# Patient Record
Sex: Male | Born: 1945 | Race: Black or African American | Hispanic: No | Marital: Single | Smoking: Never smoker
Health system: Southern US, Community
[De-identification: ages and names within clinical notes are randomized; demographics above are authoritative.]

## PROBLEM LIST (undated history)

## (undated) DIAGNOSIS — I63331 Cerebral infarction due to thrombosis of right posterior cerebral artery: Secondary | ICD-10-CM

## (undated) DIAGNOSIS — I639 Cerebral infarction, unspecified: Secondary | ICD-10-CM

## (undated) DIAGNOSIS — S72142A Displaced intertrochanteric fracture of left femur, initial encounter for closed fracture: Secondary | ICD-10-CM

## (undated) DIAGNOSIS — D62 Acute posthemorrhagic anemia: Secondary | ICD-10-CM

## (undated) DIAGNOSIS — I10 Essential (primary) hypertension: Secondary | ICD-10-CM

## (undated) DIAGNOSIS — E119 Type 2 diabetes mellitus without complications: Secondary | ICD-10-CM

## (undated) DIAGNOSIS — E1100 Type 2 diabetes mellitus with hyperosmolarity without nonketotic hyperglycemic-hyperosmolar coma (NKHHC): Secondary | ICD-10-CM

## (undated) DIAGNOSIS — E1110 Type 2 diabetes mellitus with ketoacidosis without coma: Secondary | ICD-10-CM

## (undated) DIAGNOSIS — K661 Hemoperitoneum: Secondary | ICD-10-CM

## (undated) DIAGNOSIS — E538 Deficiency of other specified B group vitamins: Secondary | ICD-10-CM

## (undated) HISTORY — DX: Cerebral infarction, unspecified: I63.9

## (undated) NOTE — *Deleted (*Deleted)
Hypoglycemic Event  CBG: 56  Treatment: 4 oz juice and crackers  Symptoms: None  Follow-up CBG: Time:*** CBG Result:***  Possible Reasons for Event: Did not have snack between meals  Comments/MD notified:Dr. Lytle Butte

---

## 1998-10-02 ENCOUNTER — Encounter: Payer: Self-pay | Admitting: Emergency Medicine

## 1998-10-02 ENCOUNTER — Inpatient Hospital Stay (HOSPITAL_COMMUNITY): Admission: EM | Admit: 1998-10-02 | Discharge: 1998-10-03 | Payer: Self-pay | Admitting: Emergency Medicine

## 1998-11-09 ENCOUNTER — Encounter: Admission: RE | Admit: 1998-11-09 | Discharge: 1998-11-09 | Payer: Self-pay | Admitting: Internal Medicine

## 2000-03-17 ENCOUNTER — Encounter: Payer: Self-pay | Admitting: *Deleted

## 2000-03-17 ENCOUNTER — Emergency Department (HOSPITAL_COMMUNITY): Admission: EM | Admit: 2000-03-17 | Discharge: 2000-03-17 | Payer: Self-pay | Admitting: Podiatry

## 2000-03-24 ENCOUNTER — Encounter: Admission: RE | Admit: 2000-03-24 | Discharge: 2000-04-12 | Payer: Self-pay | Admitting: Family Medicine

## 2002-09-07 ENCOUNTER — Emergency Department (HOSPITAL_COMMUNITY): Admission: EM | Admit: 2002-09-07 | Discharge: 2002-09-07 | Payer: Self-pay | Admitting: Emergency Medicine

## 2004-08-14 ENCOUNTER — Emergency Department (HOSPITAL_COMMUNITY): Admission: EM | Admit: 2004-08-14 | Discharge: 2004-08-14 | Payer: Self-pay | Admitting: Emergency Medicine

## 2005-06-02 ENCOUNTER — Ambulatory Visit: Payer: Self-pay | Admitting: Family Medicine

## 2005-06-09 ENCOUNTER — Ambulatory Visit: Payer: Self-pay | Admitting: Family Medicine

## 2005-07-08 ENCOUNTER — Ambulatory Visit: Payer: Self-pay | Admitting: Internal Medicine

## 2005-07-14 ENCOUNTER — Ambulatory Visit: Payer: Self-pay | Admitting: Internal Medicine

## 2005-08-22 ENCOUNTER — Ambulatory Visit: Payer: Self-pay | Admitting: Family Medicine

## 2005-08-29 ENCOUNTER — Ambulatory Visit: Payer: Self-pay | Admitting: Family Medicine

## 2006-07-02 ENCOUNTER — Emergency Department (HOSPITAL_COMMUNITY): Admission: EM | Admit: 2006-07-02 | Discharge: 2006-07-02 | Payer: Self-pay | Admitting: Emergency Medicine

## 2006-07-16 ENCOUNTER — Emergency Department (HOSPITAL_COMMUNITY): Admission: EM | Admit: 2006-07-16 | Discharge: 2006-07-16 | Payer: Self-pay | Admitting: Emergency Medicine

## 2008-01-27 ENCOUNTER — Emergency Department (HOSPITAL_COMMUNITY): Admission: EM | Admit: 2008-01-27 | Discharge: 2008-01-27 | Payer: Self-pay | Admitting: Emergency Medicine

## 2008-06-27 ENCOUNTER — Emergency Department (HOSPITAL_COMMUNITY): Admission: EM | Admit: 2008-06-27 | Discharge: 2008-06-27 | Payer: Self-pay | Admitting: Emergency Medicine

## 2008-11-02 ENCOUNTER — Emergency Department (HOSPITAL_COMMUNITY): Admission: EM | Admit: 2008-11-02 | Discharge: 2008-11-02 | Payer: Self-pay | Admitting: General Surgery

## 2009-04-04 IMAGING — CT CT HEAD W/O CM
1 series · 16 of 30 positions shown, 20 images · non-contrast
Comparison: None available

CLINICAL DATA: HIGH BLOOD SUGAR.  CONFUSION.

CT HEAD WITHOUT CONTRAST
TECHNIQUE: Contiguous axial images were obtained from the base of
the skull through the vertex without contrast

[Series 2: head_seq 4.5 h37s st · axial · 0.43mm/px · z∈[-144,+0]mm · 16 of 36 slices shown, 20 images]
[im 2/36  brain]
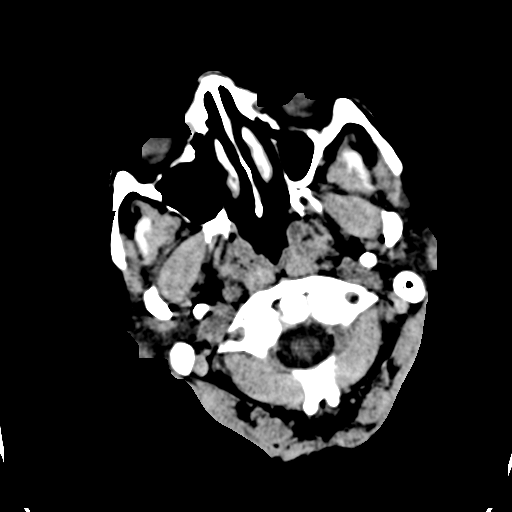
[im 2/36  bone]
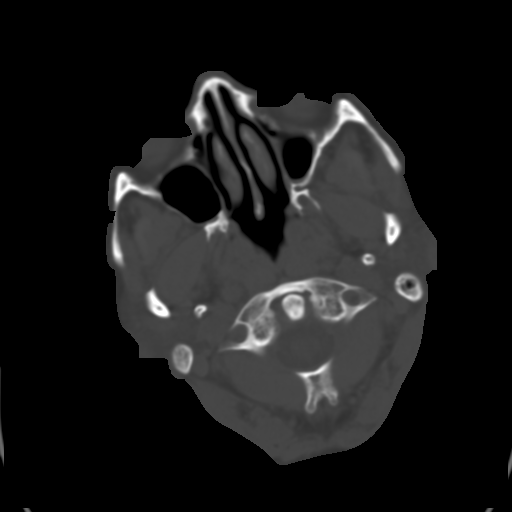
[im 4/36  brain]
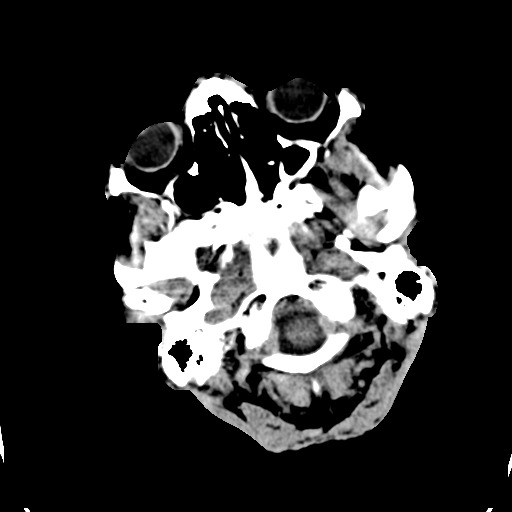
[im 7/36  brain]
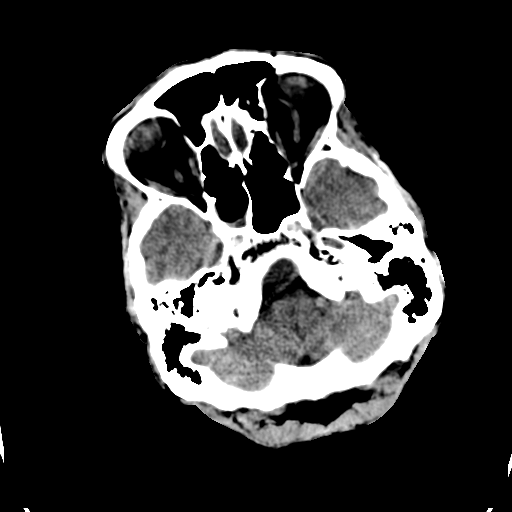
[im 9/36  brain]
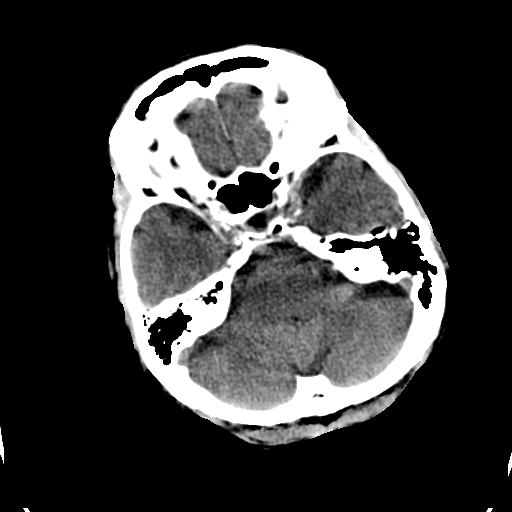
[im 10/36  brain]
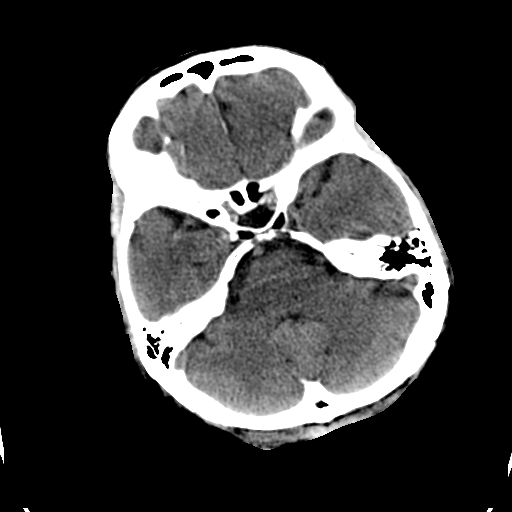
[im 10/36  bone]
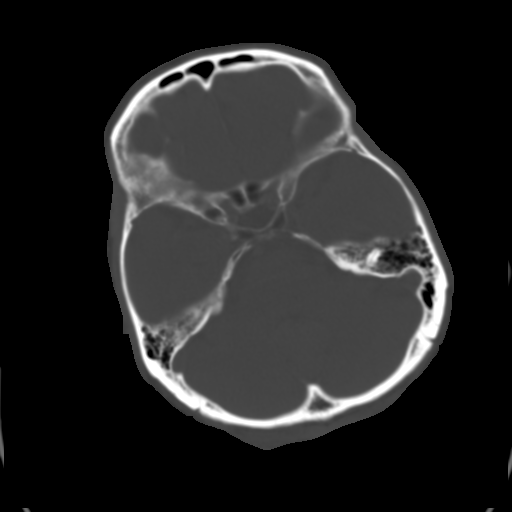
[im 13/36  brain]
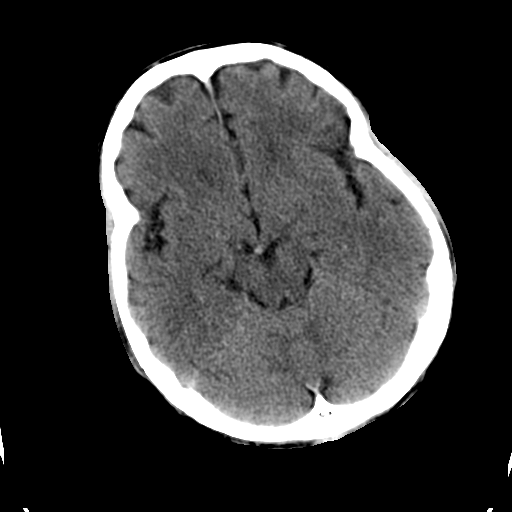
[im 15/36  brain]
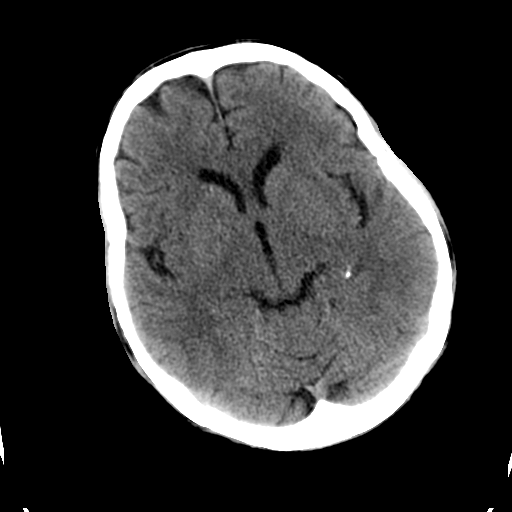
[im 17/36  brain]
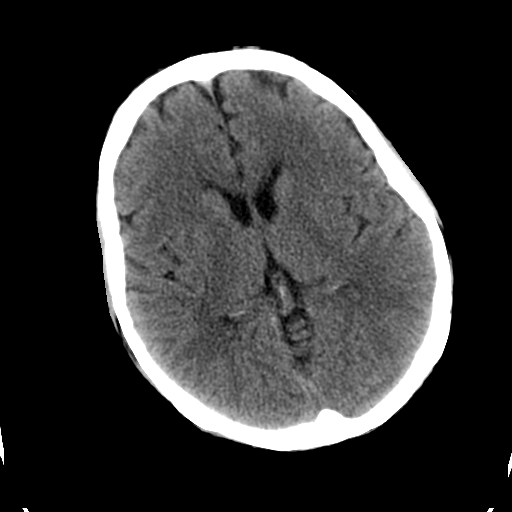
[im 19/36  brain]
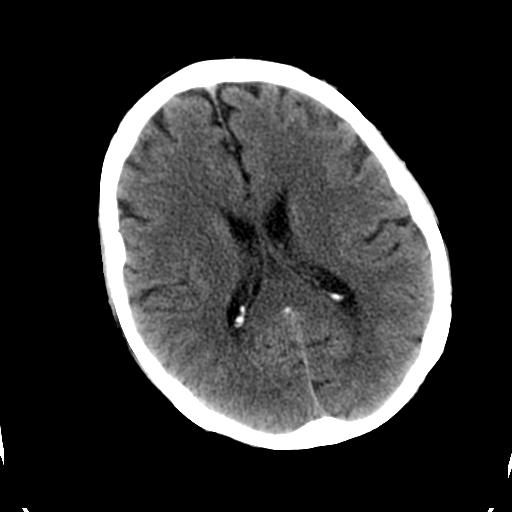
[im 19/36  bone]
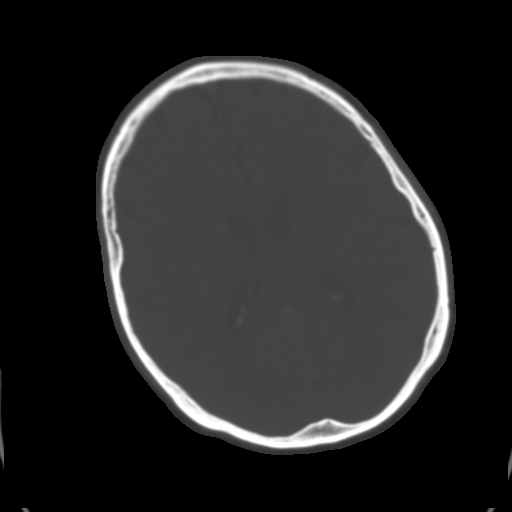
[im 21/36  brain]
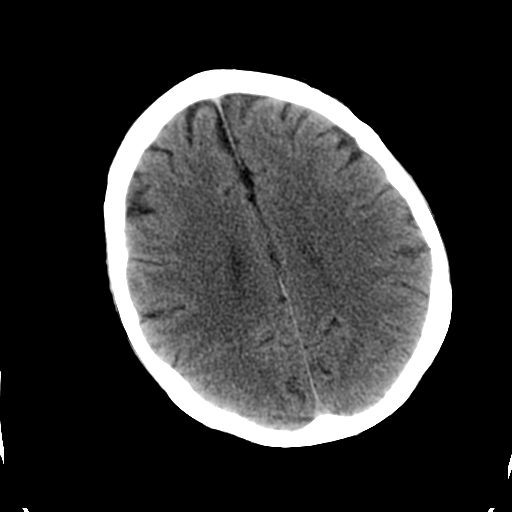
[im 23/36  brain]
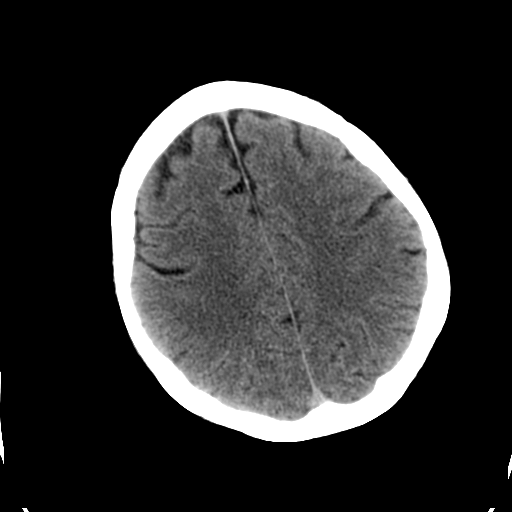
[im 26/36  brain]
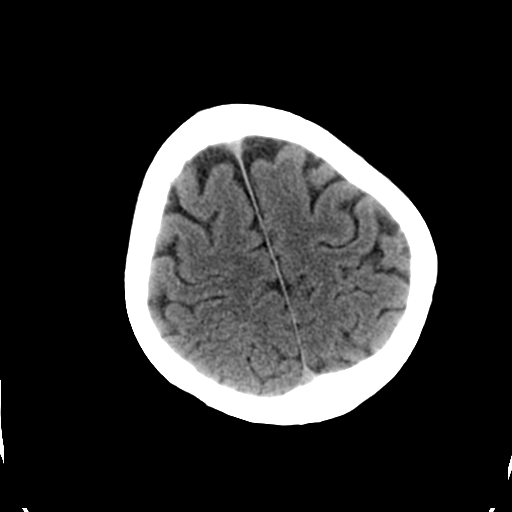
[im 27/36  brain]
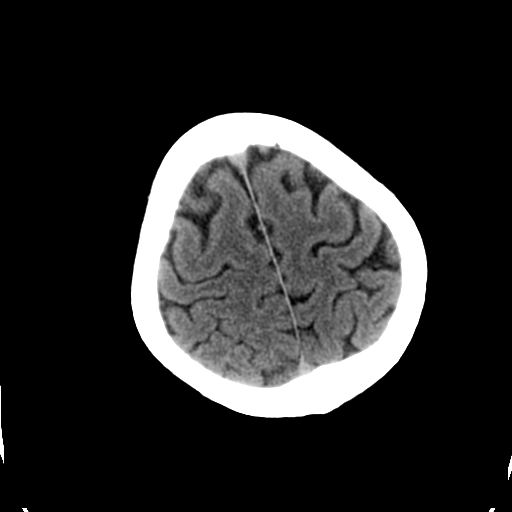
[im 27/36  bone]
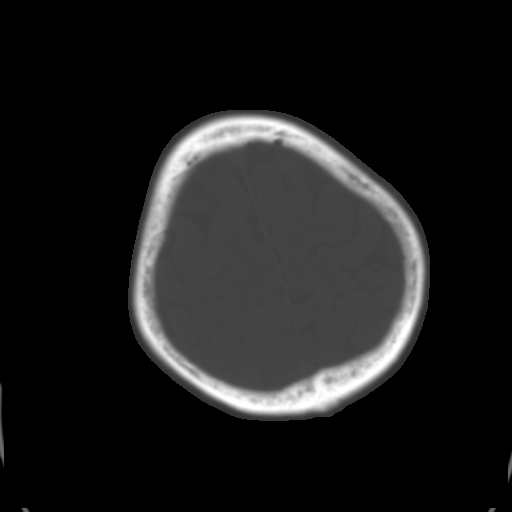
[im 29/36  brain]
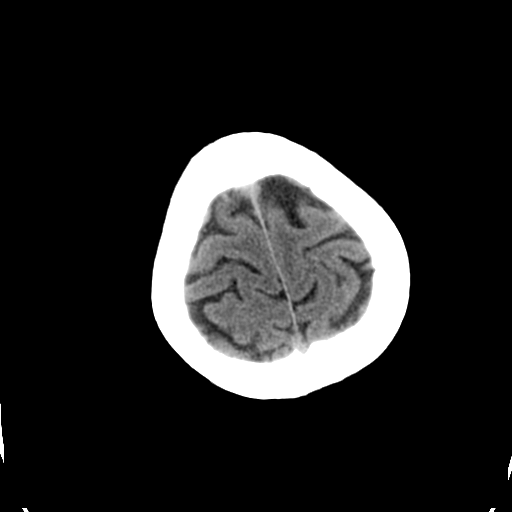
[im 32/36  brain]
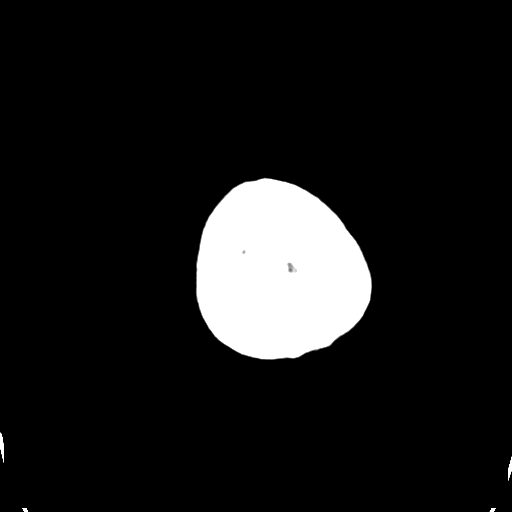
[im 34/36  brain]
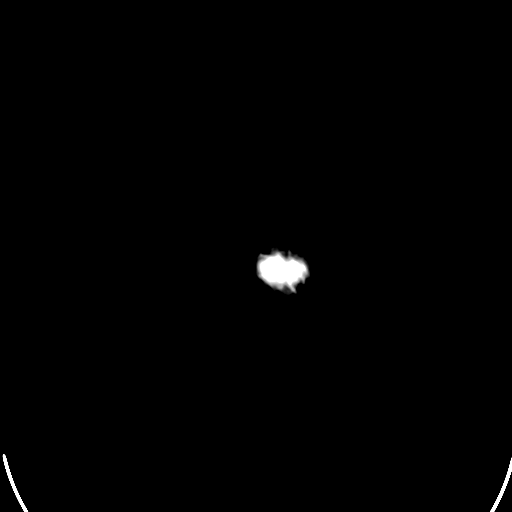

[16 of 30 positions shown; findings below may reference images not displayed]

FINDINGS: The brain has a normal appearance without evidence for
hemorrhage, acute infarction, hydrocephalus, or mass lesion.  There
is no extra axial fluid collection.  The calvarium is normal.  The
mucosal thickening is present within the right sphenoid sinus and
left maxillary sinus with frothy secretions.
IMPRESSION: Normal CT of the head without contrast.
Chronic paranasal sinus disease.

## 2010-01-09 IMAGING — CT CT HEAD W/O CM
1 series · 16 of 30 positions shown, 20 images · non-contrast
Comparison: 01/27/2008

CLINICAL DATA: Left arm numbness

CT HEAD WITHOUT CONTRAST
TECHNIQUE: Contiguous axial images were obtained from the base of
the skull through the vertex without contrast.

[Series 2: head_seq 4.5 h37s st · axial · 0.43mm/px · z∈[-121,+23]mm · 16 of 36 slices shown, 20 images]
[im 2/36  brain]
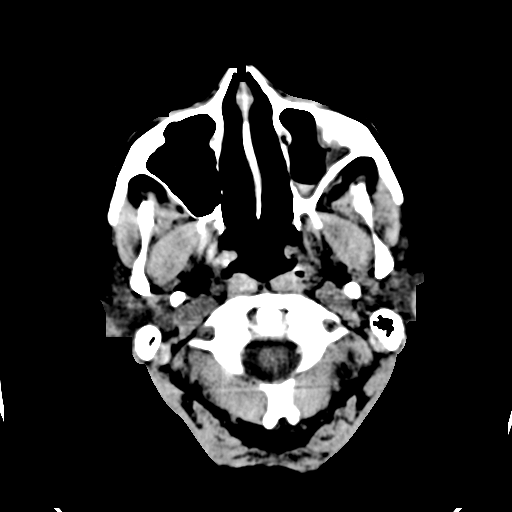
[im 2/36  bone]
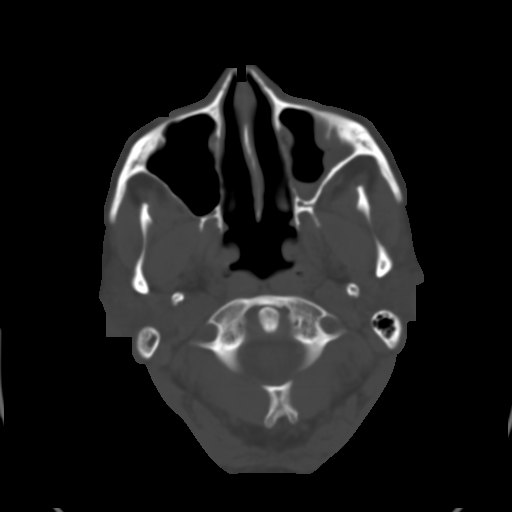
[im 4/36  brain]
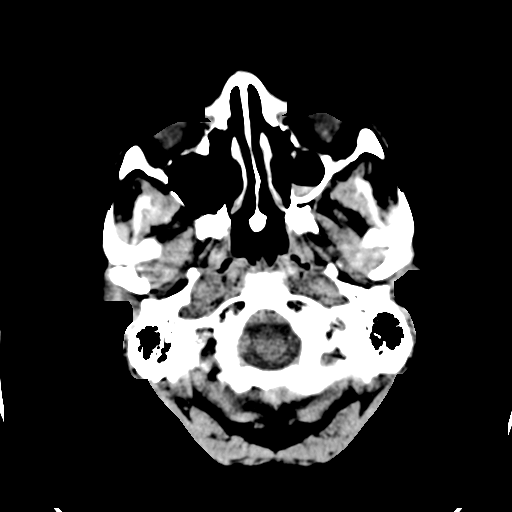
[im 7/36  brain]
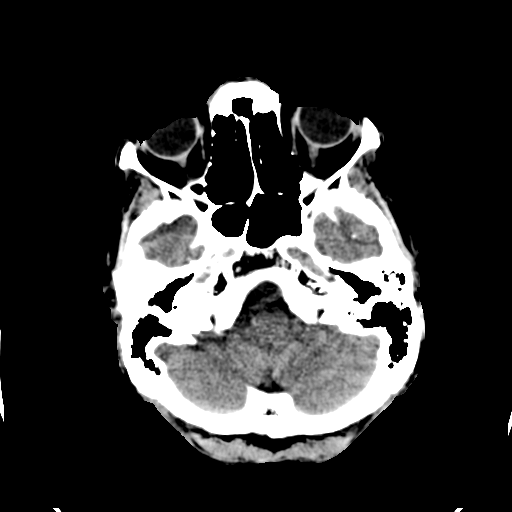
[im 9/36  brain]
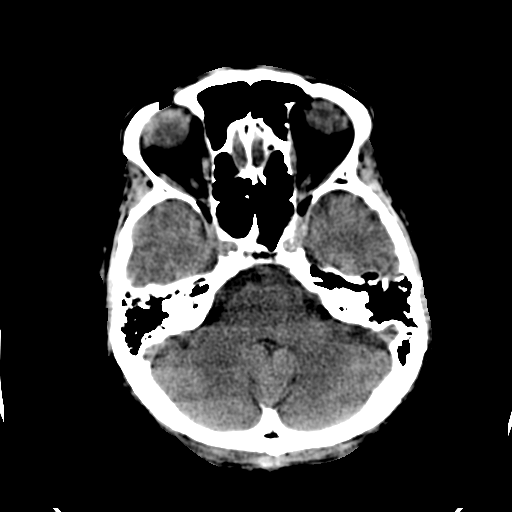
[im 10/36  brain]
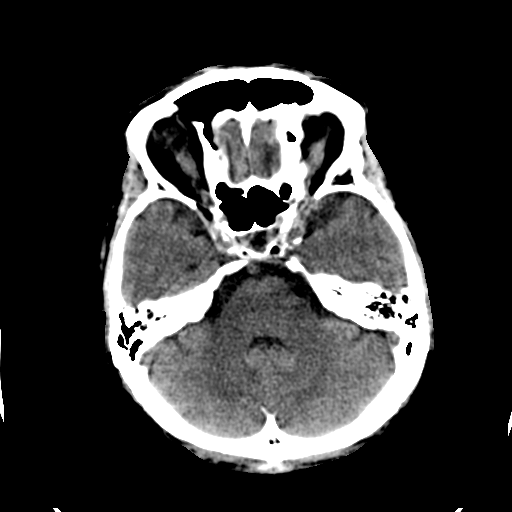
[im 10/36  bone]
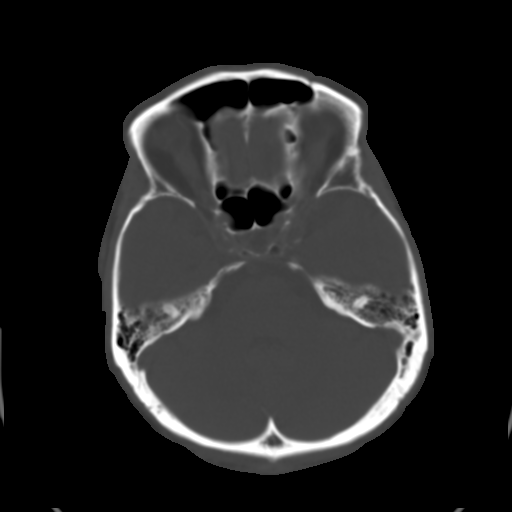
[im 13/36  brain]
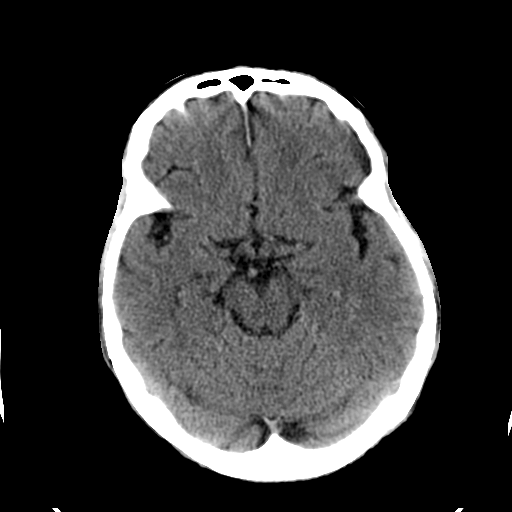
[im 15/36  brain]
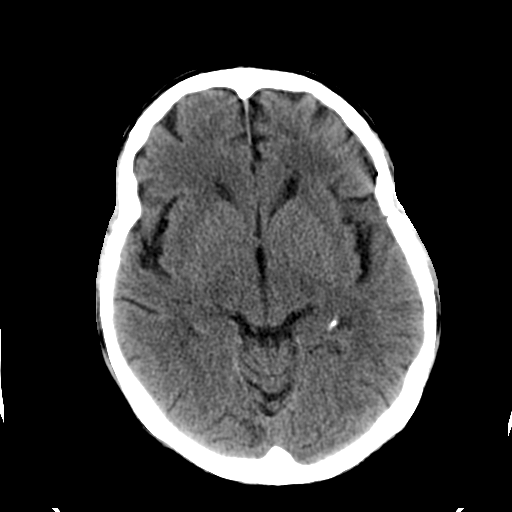
[im 17/36  brain]
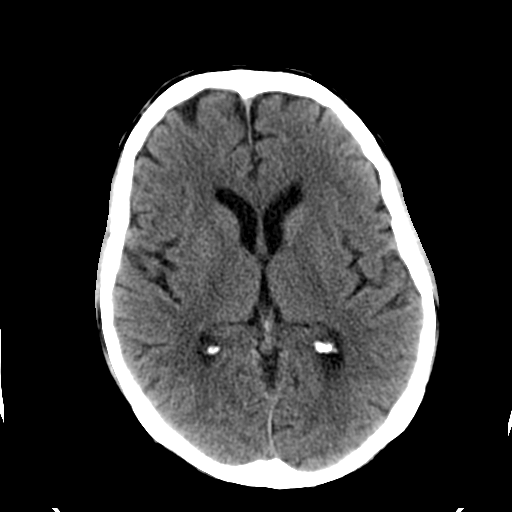
[im 19/36  brain]
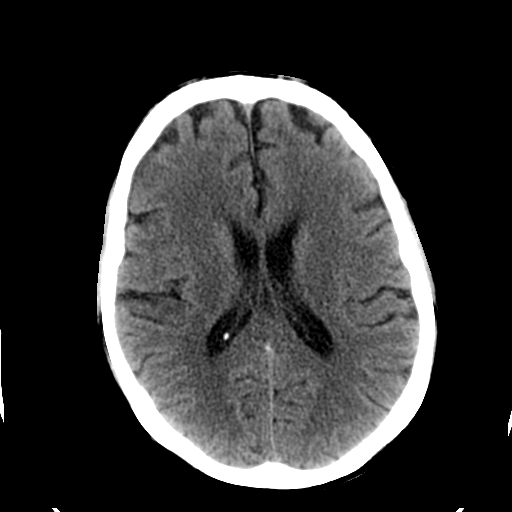
[im 19/36  bone]
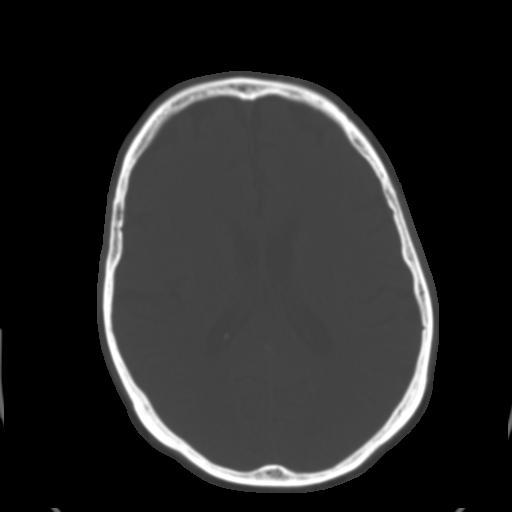
[im 21/36  brain]
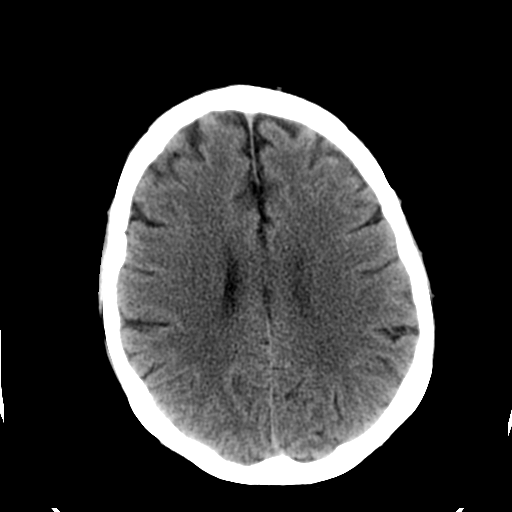
[im 23/36  brain]
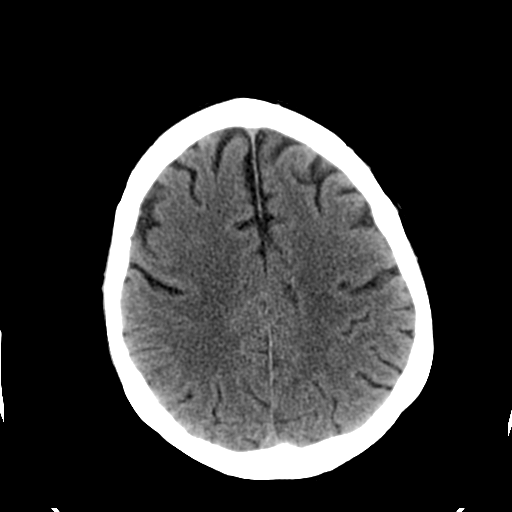
[im 26/36  brain]
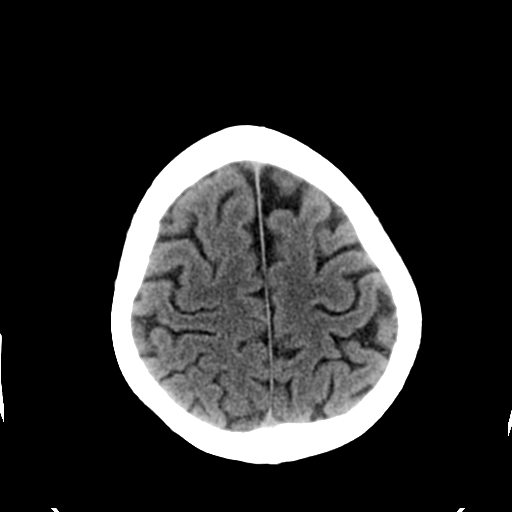
[im 27/36  brain]
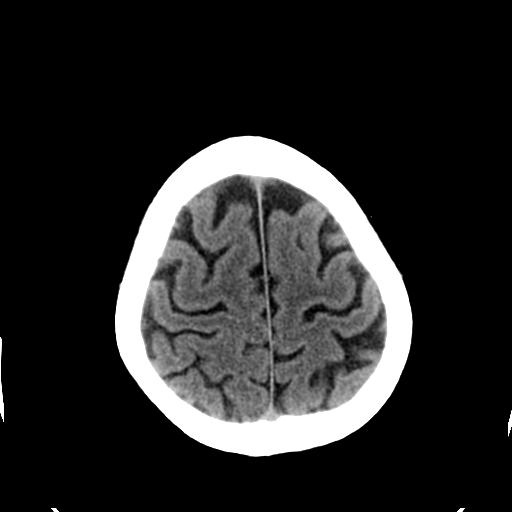
[im 27/36  bone]
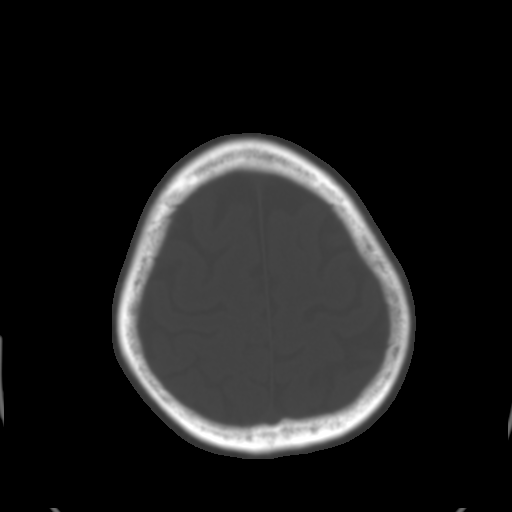
[im 29/36  brain]
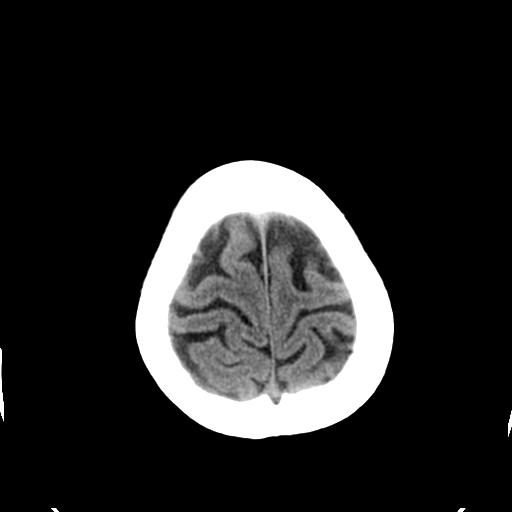
[im 32/36  brain]
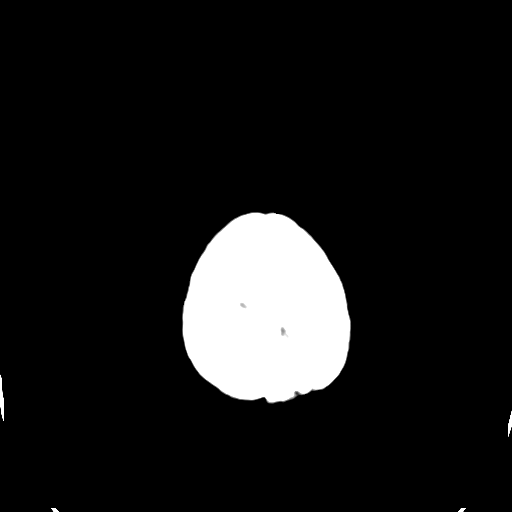
[im 34/36  brain]
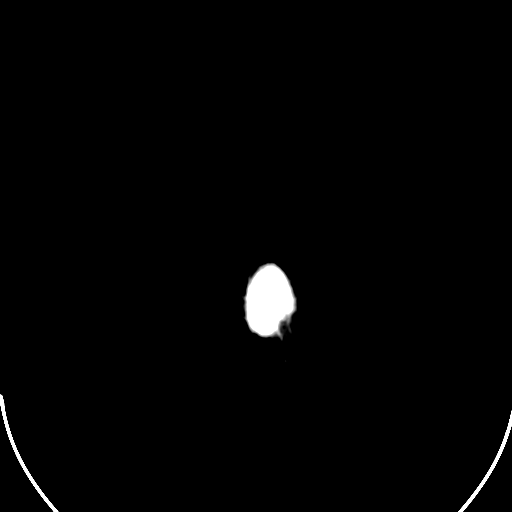

[16 of 30 positions shown; findings below may reference images not displayed]

FINDINGS: The ventricles are normal.  No extra-axial fluid
collections are seen.  The brainstem and cerebellum are
unremarkable.  No acute intracranial findings or mass lesions.

The bony calvarium is intact.  There is a small amount of
hyperdense fluid in the left maxillary sinus along with
mucoperiosteal thickening and mild sinus wall thickening.
IMPRESSION: 1.  No acute intracranial findings or mass lesions.
2.  Left maxillary sinus disease.

## 2010-07-24 ENCOUNTER — Encounter (INDEPENDENT_AMBULATORY_CARE_PROVIDER_SITE_OTHER): Payer: Self-pay | Admitting: *Deleted

## 2010-08-01 ENCOUNTER — Telehealth: Payer: Self-pay | Admitting: Family Medicine

## 2010-11-10 ENCOUNTER — Emergency Department (HOSPITAL_COMMUNITY)
Admission: EM | Admit: 2010-11-10 | Discharge: 2010-11-10 | Payer: Self-pay | Source: Home / Self Care | Admitting: Emergency Medicine

## 2010-11-10 LAB — GLUCOSE, CAPILLARY
Glucose-Capillary: 148 mg/dL — ABNORMAL HIGH (ref 70–99)
Glucose-Capillary: 223 mg/dL — ABNORMAL HIGH (ref 70–99)

## 2010-11-14 NOTE — Letter (Signed)
Summary: Colonoscopy Letter  [facility]  [place], [place] [address]   Phone: [phone]  Fax: [phone]      July 24, 2010 MRN: [id number]   [address] [place], [place]  [address]   Dear Mr. Schnitzler,   According to your medical record, it is time for you to schedule a Colonoscopy. The American Cancer Society recommends this procedure as a method to detect early colon cancer. Patients with a family history of colon cancer, or a personal history of colon polyps or inflammatory bowel disease are at increased risk.  This letter has been generated based on the recommendations made at the time of your procedure. If you feel that in your particular situation this may no longer apply, please contact our office.  Please call our office at [phone] to schedule this appointment or to update your records at your earliest convenience.  Thank you for cooperating with [place] to provide you with the very best care possible.   Sincerely,   Gatha Mayer, M.D.  [facility] [phone]

## 2010-11-14 NOTE — Progress Notes (Signed)
Summary: rx insulin   Phone Note From Pharmacy   Caller: [facility]  Summary of Call: refill humulin insulin n   Initial call taken by: Levora Angel, RN,  August 01, 2010 12:08 PM  Follow-up for Phone Call        no doucmented office visit.  Follow-up by: Levora Angel, RN,  August 01, 2010 8:25 AM  Additional Follow-up for Phone Call Additional follow up Details #1::        No, needs an OV. I haven't seen him in at least 3 years Additional Follow-up by: Laurey Morale MD,  August 01, 2010 12:04 PM    Additional Follow-up for Phone Call Additional follow up Details #2::    pt informded and stated he did not know why pharmacy called [place].  Follow-up by: Levora Angel, RN,  August 01, 2010 12:10 PM

## 2011-01-27 LAB — DIFFERENTIAL
Basophils Absolute: 0.1 10*3/uL (ref 0.0–0.1)
Basophils Relative: 1 % (ref 0–1)
Eosinophils Absolute: 0.3 10*3/uL (ref 0.0–0.7)
Eosinophils Relative: 4 % (ref 0–5)
Lymphocytes Relative: 20 % (ref 12–46)
Lymphs Abs: 1.4 10*3/uL (ref 0.7–4.0)
Monocytes Absolute: 0.3 10*3/uL (ref 0.1–1.0)
Monocytes Relative: 5 % (ref 3–12)
Neutro Abs: 4.8 10*3/uL (ref 1.7–7.7)
Neutrophils Relative %: 70 % (ref 43–77)

## 2011-01-27 LAB — COMPREHENSIVE METABOLIC PANEL
ALT: 49 U/L (ref 0–53)
AST: 68 U/L — ABNORMAL HIGH (ref 0–37)
Albumin: 3.5 g/dL (ref 3.5–5.2)
Alkaline Phosphatase: 108 U/L (ref 39–117)
BUN: 12 mg/dL (ref 6–23)
CO2: 23 mEq/L (ref 19–32)
Calcium: 8.5 mg/dL (ref 8.4–10.5)
Chloride: 100 mEq/L (ref 96–112)
Creatinine, Ser: 0.82 mg/dL (ref 0.4–1.5)
GFR calc Af Amer: >60 mL/min (ref >60)
GFR calc non Af Amer: >60 mL/min (ref >60)
Glucose, Bld: 539 mg/dL (ref 70–99)
Potassium: 4.5 mEq/L (ref 3.5–5.1)
Sodium: 136 mEq/L (ref 135–145)
Total Bilirubin: 0.8 mg/dL (ref 0.3–1.2)
Total Protein: 5.9 g/dL — ABNORMAL LOW (ref 6.0–8.3)

## 2011-01-27 LAB — CBC
HCT: 34 % — ABNORMAL LOW (ref 39.0–52.0)
Hemoglobin: 11.2 g/dL — ABNORMAL LOW (ref 13.0–17.0)
MCHC: 32.9 g/dL (ref 30.0–36.0)
MCV: 94.6 fL (ref 78.0–100.0)
Platelets: 157 10*3/uL (ref 150–400)
RBC: 3.6 MIL/uL — ABNORMAL LOW (ref 4.22–5.81)
RDW: 13.9 % (ref 11.5–15.5)
WBC: 6.9 10*3/uL (ref 4.0–10.5)

## 2011-01-27 LAB — GLUCOSE, CAPILLARY
Glucose-Capillary: 401 mg/dL — ABNORMAL HIGH (ref 70–99)
Glucose-Capillary: 431 mg/dL — ABNORMAL HIGH (ref 70–99)

## 2011-07-08 LAB — URINALYSIS, ROUTINE W REFLEX MICROSCOPIC
Bilirubin Urine: NEGATIVE
Glucose, UA: >1000 — AB
Hgb urine dipstick: NEGATIVE
Ketones, ur: 15 — AB
Leukocytes, UA: NEGATIVE
Nitrite: NEGATIVE
Protein, ur: NEGATIVE
Specific Gravity, Urine: 1.01
Urobilinogen, UA: 0.2
pH: 6

## 2011-07-08 LAB — CBC
HCT: 39.7
Hemoglobin: 13.3
MCHC: 33.6
MCV: 91
Platelets: 194
RBC: 4.36
RDW: 12.9
WBC: 8.6

## 2011-07-08 LAB — DIFFERENTIAL
Basophils Absolute: 0.1
Basophils Relative: 1
Eosinophils Absolute: 0
Eosinophils Relative: 0
Lymphocytes Relative: 15
Lymphs Abs: 1.3
Monocytes Absolute: 0.8
Monocytes Relative: 9
Neutro Abs: 6.4
Neutrophils Relative %: 75

## 2011-07-08 LAB — BASIC METABOLIC PANEL
BUN: 20
CO2: 25
Calcium: 9.4
Chloride: 96
Creatinine, Ser: 1.1
GFR calc Af Amer: >60
GFR calc non Af Amer: >60
Glucose, Bld: 279 — ABNORMAL HIGH
Potassium: 3.6
Sodium: 133 — ABNORMAL LOW

## 2011-07-08 LAB — URINE MICROSCOPIC-ADD ON: Urine-Other: NONE SEEN

## 2012-05-13 DEATH — deceased

## 2012-06-15 ENCOUNTER — Encounter: Payer: Self-pay | Admitting: Internal Medicine

## 2013-02-02 ENCOUNTER — Emergency Department (HOSPITAL_COMMUNITY): Payer: Medicare Other

## 2013-02-02 ENCOUNTER — Encounter (HOSPITAL_COMMUNITY): Payer: Self-pay | Admitting: Emergency Medicine

## 2013-02-02 ENCOUNTER — Encounter (HOSPITAL_COMMUNITY)
Admission: EM | Disposition: A | Discharge status: Home Health | Payer: Self-pay | Source: Home / Self Care | Attending: Internal Medicine

## 2013-02-02 ENCOUNTER — Inpatient Hospital Stay (HOSPITAL_COMMUNITY)
Admission: EM | Admit: 2013-02-02 | Discharge: 2013-02-09 | DRG: 470 | Disposition: A | Discharge status: Home Health | Payer: Medicare Other | Attending: Internal Medicine | Admitting: Internal Medicine

## 2013-02-02 DIAGNOSIS — Z87891 Personal history of nicotine dependence: Secondary | ICD-10-CM

## 2013-02-02 DIAGNOSIS — Y92009 Unspecified place in unspecified non-institutional (private) residence as the place of occurrence of the external cause: Secondary | ICD-10-CM

## 2013-02-02 DIAGNOSIS — E1169 Type 2 diabetes mellitus with other specified complication: Secondary | ICD-10-CM | POA: Diagnosis not present

## 2013-02-02 DIAGNOSIS — N182 Chronic kidney disease, stage 2 (mild): Secondary | ICD-10-CM | POA: Diagnosis present

## 2013-02-02 DIAGNOSIS — D62 Acute posthemorrhagic anemia: Secondary | ICD-10-CM | POA: Diagnosis not present

## 2013-02-02 DIAGNOSIS — Z79899 Other long term (current) drug therapy: Secondary | ICD-10-CM

## 2013-02-02 DIAGNOSIS — I129 Hypertensive chronic kidney disease with stage 1 through stage 4 chronic kidney disease, or unspecified chronic kidney disease: Secondary | ICD-10-CM | POA: Diagnosis present

## 2013-02-02 DIAGNOSIS — R7309 Other abnormal glucose: Secondary | ICD-10-CM

## 2013-02-02 DIAGNOSIS — S72001A Fracture of unspecified part of neck of right femur, initial encounter for closed fracture: Secondary | ICD-10-CM

## 2013-02-02 DIAGNOSIS — S72143A Displaced intertrochanteric fracture of unspecified femur, initial encounter for closed fracture: Principal | ICD-10-CM | POA: Diagnosis present

## 2013-02-02 DIAGNOSIS — E119 Type 2 diabetes mellitus without complications: Secondary | ICD-10-CM

## 2013-02-02 DIAGNOSIS — R739 Hyperglycemia, unspecified: Secondary | ICD-10-CM

## 2013-02-02 DIAGNOSIS — W010XXA Fall on same level from slipping, tripping and stumbling without subsequent striking against object, initial encounter: Secondary | ICD-10-CM | POA: Diagnosis present

## 2013-02-02 DIAGNOSIS — I10 Essential (primary) hypertension: Secondary | ICD-10-CM

## 2013-02-02 DIAGNOSIS — D638 Anemia in other chronic diseases classified elsewhere: Secondary | ICD-10-CM

## 2013-02-02 DIAGNOSIS — R600 Localized edema: Secondary | ICD-10-CM

## 2013-02-02 DIAGNOSIS — S72009A Fracture of unspecified part of neck of unspecified femur, initial encounter for closed fracture: Secondary | ICD-10-CM

## 2013-02-02 DIAGNOSIS — R5082 Postprocedural fever: Secondary | ICD-10-CM | POA: Diagnosis not present

## 2013-02-02 DIAGNOSIS — R609 Edema, unspecified: Secondary | ICD-10-CM | POA: Diagnosis present

## 2013-02-02 DIAGNOSIS — S72001S Fracture of unspecified part of neck of right femur, sequela: Secondary | ICD-10-CM

## 2013-02-02 DIAGNOSIS — Z794 Long term (current) use of insulin: Secondary | ICD-10-CM

## 2013-02-02 HISTORY — DX: Type 2 diabetes mellitus without complications: E11.9

## 2013-02-02 HISTORY — DX: Essential (primary) hypertension: I10

## 2013-02-02 LAB — BASIC METABOLIC PANEL
BUN: 23 mg/dL (ref 6–23)
CO2: 32 mEq/L (ref 19–32)
Calcium: 9.6 mg/dL (ref 8.4–10.5)
Chloride: 93 mEq/L — ABNORMAL LOW (ref 96–112)
Creatinine, Ser: 1.35 mg/dL (ref 0.50–1.35)
GFR calc Af Amer: 61 mL/min — ABNORMAL LOW (ref >90)
GFR calc non Af Amer: 53 mL/min — ABNORMAL LOW (ref >90)
Glucose, Bld: 645 mg/dL (ref 70–99)
Potassium: 3.9 mEq/L (ref 3.5–5.1)
Sodium: 134 mEq/L — ABNORMAL LOW (ref 135–145)

## 2013-02-02 LAB — CBC WITH DIFFERENTIAL/PLATELET
Basophils Absolute: 0 10*3/uL (ref 0.0–0.1)
Basophils Relative: 0 % (ref 0–1)
Eosinophils Absolute: 0.2 10*3/uL (ref 0.0–0.7)
Eosinophils Relative: 2 % (ref 0–5)
HCT: 30.6 % — ABNORMAL LOW (ref 39.0–52.0)
Hemoglobin: 10.2 g/dL — ABNORMAL LOW (ref 13.0–17.0)
Lymphocytes Relative: 16 % (ref 12–46)
Lymphs Abs: 1.6 10*3/uL (ref 0.7–4.0)
MCH: 29.1 pg (ref 26.0–34.0)
MCHC: 33.3 g/dL (ref 30.0–36.0)
MCV: 87.2 fL (ref 78.0–100.0)
Monocytes Absolute: 0.8 10*3/uL (ref 0.1–1.0)
Monocytes Relative: 9 % (ref 3–12)
Neutro Abs: 7.2 10*3/uL (ref 1.7–7.7)
Neutrophils Relative %: 74 % (ref 43–77)
Platelets: 297 10*3/uL (ref 150–400)
RBC: 3.51 MIL/uL — ABNORMAL LOW (ref 4.22–5.81)
RDW: 12.7 % (ref 11.5–15.5)
WBC: 9.7 10*3/uL (ref 4.0–10.5)

## 2013-02-02 LAB — PROTIME-INR
INR: 1.02 (ref 0.00–1.49)
Prothrombin Time: 13.3 seconds (ref 11.6–15.2)

## 2013-02-02 LAB — GLUCOSE, CAPILLARY
Glucose-Capillary: 590 mg/dL (ref 70–99)
Glucose-Capillary: 298 mg/dL — ABNORMAL HIGH (ref 70–99)
Glucose-Capillary: 292 mg/dL — ABNORMAL HIGH (ref 70–99)

## 2013-02-02 LAB — URINALYSIS, ROUTINE W REFLEX MICROSCOPIC
Bilirubin Urine: NEGATIVE
Glucose, UA: >1000 mg/dL — AB
Ketones, ur: NEGATIVE mg/dL
Leukocytes, UA: NEGATIVE
Nitrite: NEGATIVE
Protein, ur: NEGATIVE mg/dL
Specific Gravity, Urine: 1.03 (ref 1.005–1.030)
Urobilinogen, UA: 0.2 mg/dL (ref 0.0–1.0)
pH: 6.5 (ref 5.0–8.0)

## 2013-02-02 LAB — URINE MICROSCOPIC-ADD ON

## 2013-02-02 SURGERY — HEMIARTHROPLASTY, HIP, DIRECT ANTERIOR APPROACH, FOR FRACTURE
Anesthesia: Choice | Laterality: Right

## 2013-02-02 MED ORDER — SODIUM CHLORIDE 0.9 % IV SOLN
INTRAVENOUS | Status: DC
  Administered 2013-02-02 – 2013-02-07 (×3): via INTRAVENOUS

## 2013-02-02 MED ORDER — INSULIN ASPART 100 UNIT/ML ~~LOC~~ SOLN
10.0000 [IU] | Freq: Once | SUBCUTANEOUS | Status: AC
  Administered 2013-02-02: 10 [IU] via INTRAVENOUS
  Filled 2013-02-02: qty 1

## 2013-02-02 MED ORDER — ACETAMINOPHEN 325 MG PO TABS: 650.0000 mg | ORAL_TABLET | Freq: Four times a day (QID) | ORAL | Status: DC | PRN

## 2013-02-02 MED ORDER — SODIUM CHLORIDE 0.9 % IV BOLUS (SEPSIS)
1000.0000 mL | Freq: Once | INTRAVENOUS | Status: AC
  Administered 2013-02-02: 1000 mL via INTRAVENOUS

## 2013-02-02 MED ORDER — SENNOSIDES-DOCUSATE SODIUM 8.6-50 MG PO TABS
1.0000 | ORAL_TABLET | Freq: Every evening | ORAL | Status: DC | PRN
  Filled 2013-02-02: qty 1

## 2013-02-02 MED ORDER — CHLORHEXIDINE GLUCONATE 4 % EX LIQD
60.0000 mL | Freq: Once | CUTANEOUS | Status: DC
  Filled 2013-02-02: qty 60

## 2013-02-02 MED ORDER — DEXTROSE-NACL 5-0.45 % IV SOLN
INTRAVENOUS | Status: DC
  Administered 2013-02-03: 02:00:00 via INTRAVENOUS

## 2013-02-02 MED ORDER — MORPHINE SULFATE 2 MG/ML IJ SOLN: 0.5000 mg | INTRAMUSCULAR | Status: DC | PRN

## 2013-02-02 MED ORDER — ONDANSETRON HCL 4 MG/2ML IJ SOLN: 4.0000 mg | Freq: Four times a day (QID) | INTRAMUSCULAR | Status: DC | PRN

## 2013-02-02 MED ORDER — DEXTROSE 50 % IV SOLN: 25.0000 mL | INTRAVENOUS | Status: DC | PRN

## 2013-02-02 MED ORDER — FENTANYL CITRATE 0.05 MG/ML IJ SOLN
50.0000 ug | Freq: Once | INTRAMUSCULAR | Status: AC
  Administered 2013-02-02: 50 ug via INTRAVENOUS
  Filled 2013-02-02: qty 2

## 2013-02-02 MED ORDER — ONDANSETRON HCL 4 MG PO TABS: 4.0000 mg | ORAL_TABLET | Freq: Four times a day (QID) | ORAL | Status: DC | PRN

## 2013-02-02 MED ORDER — ACETAMINOPHEN 650 MG RE SUPP: 650.0000 mg | Freq: Four times a day (QID) | RECTAL | Status: DC | PRN

## 2013-02-02 MED ORDER — OXYCODONE HCL 5 MG PO TABS
5.0000 mg | ORAL_TABLET | ORAL | Status: DC | PRN
  Administered 2013-02-03 – 2013-02-04 (×6): 5 mg via ORAL
  Filled 2013-02-02 (×7): qty 1

## 2013-02-02 MED ORDER — MORPHINE SULFATE 2 MG/ML IJ SOLN
2.0000 mg | INTRAMUSCULAR | Status: DC | PRN
  Administered 2013-02-02 – 2013-02-05 (×11): 2 mg via INTRAVENOUS
  Filled 2013-02-02 (×11): qty 1

## 2013-02-02 MED ORDER — ONDANSETRON HCL 4 MG/2ML IJ SOLN: 4.0000 mg | Freq: Three times a day (TID) | INTRAMUSCULAR | Status: DC | PRN

## 2013-02-02 MED ORDER — SODIUM CHLORIDE 0.9 % IV SOLN
INTRAVENOUS | Status: DC
  Administered 2013-02-03: 22:00:00 via INTRAVENOUS
  Administered 2013-02-04 (×2): 100 mL/h via INTRAVENOUS

## 2013-02-02 MED ORDER — SODIUM CHLORIDE 0.9 % IV SOLN
INTRAVENOUS | Status: DC
  Administered 2013-02-02: 22:00:00 via INTRAVENOUS
  Filled 2013-02-02: qty 1

## 2013-02-02 MED ORDER — HYDROCODONE-ACETAMINOPHEN 5-325 MG PO TABS
1.0000 | ORAL_TABLET | ORAL | Status: AC | PRN
  Administered 2013-02-02 – 2013-02-03 (×2): 2 via ORAL
  Filled 2013-02-02 (×2): qty 2

## 2013-02-02 MED ORDER — INSULIN REGULAR BOLUS VIA INFUSION
0.0000 [IU] | Freq: Three times a day (TID) | INTRAVENOUS | Status: DC
  Filled 2013-02-02: qty 10

## 2013-02-02 MED ORDER — ENOXAPARIN SODIUM 40 MG/0.4ML ~~LOC~~ SOLN
40.0000 mg | Freq: Every day | SUBCUTANEOUS | Status: DC
  Administered 2013-02-03: 40 mg via SUBCUTANEOUS
  Filled 2013-02-02 (×2): qty 0.4

## 2013-02-02 NOTE — ED Notes (Signed)
Pt states he fell 2 weeks ago and has had R hip pain.  Pt states he has been unable to bear weight on that leg since.  Also states CBG was 500 at home.  Has been having CBG in 400s for past few days.  Denies feeling unwell.

## 2013-02-02 NOTE — H&P (Signed)
[facility]          History and Physical    PCP:   No primary provider on file.   Chief Complaint:  Right hip pain  HPI: Patient is a 67 year old African American man with a history of insulin-dependent diabetes, who unfortunately does not have regular medical followup. He states that about 2 weeks ago he tripped over some wood in his mother's house, landed on his right hip. He immediately started experiencing pain, however he did not seek medical attention at that time. His brother had an old set of crutches at home and he has been using them to  ambulate. A family member convinced him to come to the hospital today for evaluation where x-rays show a comminuted right intertrochanteric hip fracture. He was also found to have a CBG in the 500s to 600s. We have been asked to admit him for further evaluation and management.  Allergies:  No Known Allergies    Past Medical History  Diagnosis Date  . Diabetes mellitus without complication   . Hypertension   . Hernia   . Back pain     Past Surgical History  Procedure Laterality Date  . No past surgeries      Prior to Admission medications   Medication Sig Start Date End Date Taking? Authorizing Provider  alprazolam [name]) 2 MG tablet Take 2 mg by mouth 4 (four) times daily.   Yes Historical Provider, MD  insulin lispro (HUMALOG) 100 UNIT/ML injection Inject 30 Units into the skin 2 (two) times daily.   Yes Historical Provider, MD  oxycodone (ROXICODONE) 30 MG immediate release tablet Take 30 mg by mouth every 4 (four) hours as needed for pain.   Yes Historical Provider, MD    Social History:  reports that he quit smoking 2 days ago. His smoking use included Cigarettes. He has a 7.5 pack-year smoking history. He has never used smokeless tobacco. He reports that he does not drink alcohol or use illicit drugs.  History reviewed. No pertinent family history.  Review of Systems:  Constitutional: Denies fever,  chills, diaphoresis, appetite change and fatigue.  HEENT: Denies photophobia, eye pain, redness, hearing loss, ear pain, congestion, sore throat, rhinorrhea, sneezing, mouth sores, trouble swallowing, neck pain, neck stiffness and tinnitus.   Respiratory: Denies SOB, DOE, cough, chest tightness,  and wheezing.   Cardiovascular: Denies chest pain, palpitations. Gastrointestinal: Denies nausea, vomiting, abdominal pain, diarrhea, constipation, blood in stool and abdominal distention.  Genitourinary: Denies dysuria, urgency, frequency, hematuria, flank pain and difficulty urinating.  Musculoskeletal: Denies myalgias, back pain, joint swelling, arthralgias. Skin: Denies pallor, rash and wound.  Neurological: Denies dizziness, seizures, syncope, weakness, light-headedness, numbness and headaches.  Hematological: Denies adenopathy. Easy bruising, personal or family bleeding history  Psychiatric/Behavioral: Denies suicidal ideation, mood changes, confusion, nervousness, sleep disturbance and agitation   Physical Exam: Blood pressure 153/77, pulse 88, temperature 98.3 F (36.8 C), temperature source Oral, resp. rate 18, SpO2 94.00%. General: Alert, awake, oriented x3. HEENT: Normocephalic, atraumatic, pupils equal round and reactive to light, intact extraocular movements, poor dentition. Neck: Supple, no JVD, no lymphadenopathy, no bruits, no goiter. Cardiovascular: Regular rate and rhythm, no murmurs, rubs or gallops. Lungs: Clear to auscultation bilaterally. Abdomen: Soft, nontender, nondistended, positive bowel sounds, no masses or organomegaly noted. Extremities: 2-3+ pitting edema on the right lower extremity, positive pulses. No obvious deformity. Neurologic: Grossly intact and nonfocal, I have not ambulated him.  Labs on Admission:  Results for orders placed during the  hospital encounter of 02/02/13 (from the past 48 hour(s))  GLUCOSE, CAPILLARY     Status: Abnormal   Collection Time     02/02/13  5:43 PM      Result Value Range   Glucose-Capillary 590 (*) 70 - 99 mg/dL   Comment 1 Confirm Test in Lab    CBC WITH DIFFERENTIAL     Status: Abnormal   Collection Time    02/02/13  6:10 PM      Result Value Range   WBC 9.7  4.0 - 10.5 K/uL   RBC 3.51 (*) 4.22 - 5.81 MIL/uL   Hemoglobin 10.2 (*) 13.0 - 17.0 g/dL   HCT 30.6 (*) 39.0 - 52.0 %   MCV 87.2  78.0 - 100.0 fL   MCH 29.1  26.0 - 34.0 pg   MCHC 33.3  30.0 - 36.0 g/dL   RDW 12.7  11.5 - 15.5 %   Platelets 297  150 - 400 K/uL   Neutrophils Relative 74  43 - 77 %   Neutro Abs 7.2  1.7 - 7.7 K/uL   Lymphocytes Relative 16  12 - 46 %   Lymphs Abs 1.6  0.7 - 4.0 K/uL   Monocytes Relative 9  3 - 12 %   Monocytes Absolute 0.8  0.1 - 1.0 K/uL   Eosinophils Relative 2  0 - 5 %   Eosinophils Absolute 0.2  0.0 - 0.7 K/uL   Basophils Relative 0  0 - 1 %   Basophils Absolute 0.0  0.0 - 0.1 K/uL  BASIC METABOLIC PANEL     Status: Abnormal   Collection Time    02/02/13  6:10 PM      Result Value Range   Sodium 134 (*) 135 - 145 mEq/L   Potassium 3.9  3.5 - 5.1 mEq/L   Chloride 93 (*) 96 - 112 mEq/L   CO2 32  19 - 32 mEq/L   Glucose, Bld 645 (*) 70 - 99 mg/dL   Comment: CRITICAL RESULT CALLED TO, READ BACK BY AND VERIFIED WITH:     [id number]   BUN 23  6 - 23 mg/dL   Creatinine, Ser 1.35  0.50 - 1.35 mg/dL   Calcium 9.6  8.4 - 10.5 mg/dL   GFR calc non Af Amer 53 (*) >90 mL/min   GFR calc Af Amer 61 (*) >90 mL/min   Comment:            The eGFR has been calculated     using the CKD EPI equation.     This calculation has not been     validated in all clinical     situations.     eGFR's persistently     <90 mL/min signify     possible Chronic Kidney Disease.  PROTIME-INR     Status: None   Collection Time    02/02/13  7:35 PM      Result Value Range   Prothrombin Time 13.3  11.6 - 15.2 seconds   INR 1.02  0.00 - 1.49    Radiological Exams on Admission: Dg Chest 2 View  02/02/2013   *RADIOLOGY REPORT*  Clinical Data: 67 year old male preoperative study of fracture.  CHEST - 2 VIEW  Comparison: None.  Findings: Semi upright AP and lateral views of the chest.  Cardiac size at the upper limits of normal. Other mediastinal contours are within normal limits.  Visualized tracheal air column is within normal limits.  The lungs are clear.  No pneumothorax or effusion.  IMPRESSION: No acute cardiopulmonary abnormality.   Original Report Authenticated By: Roselyn Reef, M.D.    Dg Hip Complete Right  02/02/2013  *RADIOLOGY REPORT*  Clinical Data: 67 year old male with fall 2 weeks ago.  Pain.  RIGHT HIP - COMPLETE 2+ VIEW  Comparison: None.  Findings:  Comminuted right femoral neck fracture with varus impaction.  Evidence of periosteal reaction.  Suggestion of areas of bone resorption.  Areas of sclerosis.  Visible right femur from the trochanter distally is otherwise intact.  Grossly intact proximal left femur.  Pelvis intact.  Impression:  Subacute comminuted right femoral neck fracture with varus impaction. Given the altered bone mineralization is difficult to exclude this as a pathologic fracture.   Original Report Authenticated By: Roselyn Reef, M.D.     Assessment/Plan Principal Problem:   Closed right hip fracture Active Problems:   DIABETES MELLITUS, TYPE II   Hyperglycemia   Edema of right lower extremity   CKD (chronic kidney disease) stage 2, GFR 60-89 ml/min   Anemia of chronic disease    Right hip fracture -Have discussed his case with Dr. Onnie Graham with orthopedics. -Plan is to taken to the [facility] for repair.  Hyperglycemia in insulin-dependent diabetes -He takes insulin 7030 at home, although I am unsure who prescribes this for him. -His CBGs have been in the high 500s to 600s while in the emergency department. -He is not acidotic and has no indication of DKA. -Will start him on IV insulin via the glucose stabilizer protocol as I would like to aggressively bring his  sugars down as he is going for surgery later tonight.  Right lower extremity edema -Could simply be from his hip fracture, however also concerned for DVT given he has been more sedentary. -We'll order a right lower extremity venous Doppler to rule out DVT, this need not delay surgery and can be performed in the morning.  Chronic kidney disease stage II -Suspect related to diabetes. -We'll need close medical followup at time of discharge.  Anemia -Likely anemia of chronic disease given his chronic kidney disease. -Hemoglobin is 10.2, no indication for transfusion at this time. -Will order an anemia panel to see if he may benefit from iron supplementation.  DVT prophylaxis -Lovenox.  Time Spent on Admission: 75 minutes.  Lelon Frohlich [facility] Pager: [phone] 02/02/2013, 8:17 PM

## 2013-02-02 NOTE — ED Provider Notes (Signed)
History     CSN: [id number]  Arrival date & time 02/02/13  1725   First MD Initiated Contact with Patient 02/02/13 1733      Chief Complaint  Patient presents with  . Fall  . Hip Pain  . Hyperglycemia    (Consider location/radiation/quality/duration/timing/severity/associated sxs/prior treatment) HPI  Patient presents with 2 chief complaints. The patient notes that since a mechanical fall 2 weeks ago he said pain persistently in the right hip.  The pain is lateral, with radiation throughout the leg, with severe pain on weightbearing, though the patient can minimally bear weight. No relief with anything. And no distal dysesthesia or weakness.  Chief complaint #2 the patient notes that he has had increasing hyperglycemia for the past few days, though without nausea, vomiting, diarrhea, confusion, lightheadedness, chest pain, dyspnea.  The patient continues to take his typical insulin dose 70/30.  Past Medical History  Diagnosis Date  . Diabetes mellitus without complication   . Hypertension     History reviewed. No pertinent past surgical history.  History reviewed. No pertinent family history.  History  Substance Use Topics  . Smoking status: Current Some Day Smoker  . Smokeless tobacco: Not on file  . Alcohol Use: No      Review of Systems  Constitutional:       Per HPI, otherwise negative  HENT:       Per HPI, otherwise negative  Respiratory:       Per HPI, otherwise negative  Cardiovascular:       Per HPI, otherwise negative  Gastrointestinal: Negative for vomiting.  Endocrine:       Negative aside from HPI  Genitourinary: Negative for dysuria and hematuria.  Musculoskeletal:       Per HPI, otherwise negative  Skin: Negative.   Neurological: Negative for syncope.    Allergies  Review of patient's allergies indicates not on file.  Home Medications  No current outpatient prescriptions on file.  There were no vitals taken for this  visit.  Physical Exam  Nursing note and vitals reviewed. Constitutional: He is oriented to person, place, and time. He appears well-developed. No distress.  HENT:  Head: Normocephalic and atraumatic.  Eyes: Conjunctivae and EOM are normal.  Cardiovascular: Normal rate and regular rhythm.   Pulmonary/Chest: Effort normal. No stridor. No respiratory distress.  Abdominal: Soft. He exhibits no distension. There is no tenderness. There is no rebound and no guarding.  Genitourinary:  Very strong odor of urine  Musculoskeletal: He exhibits no edema.  Patient can elevate the left leg, and the left hip, knee, ankle are all unremarkable.  There's tenderness the patient about the anterior right hip him with no appreciable deformity, but the patient cannot flex the hip.  Passively strength of the knee and ankle seem unremarkable, though evaluation is limited secondary to patient's hip pain prohibiting full exam.  Neurological: He is alert and oriented to person, place, and time.  Skin: Skin is warm and dry.  Psychiatric: He has a normal mood and affect.    ED Course  Procedures (including critical care time)  Labs Reviewed  CBC WITH DIFFERENTIAL  BASIC METABOLIC PANEL  URINALYSIS, ROUTINE W REFLEX MICROSCOPIC   No results found.   No diagnosis found.  7:50 PM I interpreted the x-ray, demonstrated to the patient and his brother.  I then spoke with our orthopedist on call, Dr. Onnie Graham.   Date: 02/02/2013  Rate: 78  Rhythm: normal sinus rhythm  QRS Axis: left  Intervals: normal  ST/T Wave abnormalities: nonspecific T wave changes  Conduction Disutrbances:none  Narrative Interpretation:   Old EKG Reviewed: none available ABNORMAL   MDM  This patient with poorly controlled diabetes now presents after a fall that occurred at least one week ago, with new pain, inability to bear weight.  The patient is hyperglycemic, but there is no anion gap, the patient is in no distress, there is low  suspicion for DKA.  The patient's x-ray demonstrates a right intratrochanteric fracture.  The patient required admission for further evaluation and management.  In the emergency department he received IV fluids, insulin, and preoperative labs were performed.        Carmin Muskrat, MD 02/02/13 [phone]

## 2013-02-02 NOTE — ED Notes (Signed)
[id number] 

## 2013-02-02 NOTE — Consult Note (Signed)
Reason for Consult: Right hip pain after fall Referring Physician: EDP  HPI: [name] [name] is an 67 y.o. male S/P mechanical fall 2 weeks ago. Reports pain persistently in the right hip. The pain is lateral, with radiation throughout the leg, with severe pain on weightbearing, though the patient can minimally bear weight. Reports being in pain management at [facility]? Where he has been receiving oxycodone, insulin, and other meds of which he cannot recall   Past Medical History  Diagnosis Date  . Diabetes mellitus without complication   . Hypertension   . Hernia   . Back pain     Past Surgical History  Procedure Laterality Date  . No past surgeries      History reviewed. No pertinent family history.  Social History:  reports that he quit smoking 2 days ago. His smoking use included Cigarettes. He has a 7.5 pack-year smoking history. He has never used smokeless tobacco. He reports that he does not drink alcohol or use illicit drugs.  Allergies: No Known Allergies  Medications: I have reviewed the patient's current medications. He is unable to provide details  Results for orders placed during the hospital encounter of 02/02/13 (from the past 48 hour(s))  GLUCOSE, CAPILLARY     Status: Abnormal   Collection Time    02/02/13  5:43 PM      Result Value Range   Glucose-Capillary 590 (*) 70 - 99 mg/dL   Comment 1 Confirm Test in Lab    CBC WITH DIFFERENTIAL     Status: Abnormal   Collection Time    02/02/13  6:10 PM      Result Value Range   WBC 9.7  4.0 - 10.5 K/uL   RBC 3.51 (*) 4.22 - 5.81 MIL/uL   Hemoglobin 10.2 (*) 13.0 - 17.0 g/dL   HCT 30.6 (*) 39.0 - 52.0 %   MCV 87.2  78.0 - 100.0 fL   MCH 29.1  26.0 - 34.0 pg   MCHC 33.3  30.0 - 36.0 g/dL   RDW 12.7  11.5 - 15.5 %   Platelets 297  150 - 400 K/uL   Neutrophils Relative 74  43 - 77 %   Neutro Abs 7.2  1.7 - 7.7 K/uL   Lymphocytes Relative 16  12 - 46 %   Lymphs Abs 1.6  0.7 - 4.0 K/uL   Monocytes Relative 9  3 - 12 %    Monocytes Absolute 0.8  0.1 - 1.0 K/uL   Eosinophils Relative 2  0 - 5 %   Eosinophils Absolute 0.2  0.0 - 0.7 K/uL   Basophils Relative 0  0 - 1 %   Basophils Absolute 0.0  0.0 - 0.1 K/uL  BASIC METABOLIC PANEL     Status: Abnormal   Collection Time    02/02/13  6:10 PM      Result Value Range   Sodium 134 (*) 135 - 145 mEq/L   Potassium 3.9  3.5 - 5.1 mEq/L   Chloride 93 (*) 96 - 112 mEq/L   CO2 32  19 - 32 mEq/L   Glucose, Bld 645 (*) 70 - 99 mg/dL   Comment: CRITICAL RESULT CALLED TO, READ BACK BY AND VERIFIED WITH:     [id number]   BUN 23  6 - 23 mg/dL   Creatinine, Ser 1.35  0.50 - 1.35 mg/dL   Calcium 9.6  8.4 - 10.5 mg/dL   GFR calc non Af Amer 53 (*) >90 mL/min  GFR calc Af Amer 61 (*) >90 mL/min   Comment:            The eGFR has been calculated     using the CKD EPI equation.     This calculation has not been     validated in all clinical     situations.     eGFR's persistently     <90 mL/min signify     possible Chronic Kidney Disease.  PROTIME-INR     Status: None   Collection Time    02/02/13  7:35 PM      Result Value Range   Prothrombin Time 13.3  11.6 - 15.2 seconds   INR 1.02  0.00 - 1.49    Dg Chest 2 View  02/02/2013  *RADIOLOGY REPORT*  Clinical Data: 67 year old male preoperative study of fracture.  CHEST - 2 VIEW  Comparison: None.  Findings: Semi upright AP and lateral views of the chest.  Cardiac size at the upper limits of normal. Other mediastinal contours are within normal limits.  Visualized tracheal air column is within normal limits.  The lungs are clear.  No pneumothorax or effusion.  IMPRESSION: No acute cardiopulmonary abnormality.   Original Report Authenticated By: Roselyn Reef, M.D.    Dg Hip Complete Right  02/02/2013  *RADIOLOGY REPORT*  Clinical Data: 67 year old male with fall 2 weeks ago.  Pain.  RIGHT HIP - COMPLETE 2+ VIEW  Comparison: None.  Findings:  Comminuted right femoral neck fracture with varus  impaction.  Evidence of periosteal reaction.  Suggestion of areas of bone resorption.  Areas of sclerosis.  Visible right femur from the trochanter distally is otherwise intact.  Grossly intact proximal left femur.  Pelvis intact.  Impression:  Subacute comminuted right femoral neck fracture with varus impaction. Given the altered bone mineralization is difficult to exclude this as a pathologic fracture.   Original Report Authenticated By: Roselyn Reef, M.D.      Vitals Temp:  [98.3 F (36.8 C)] 98.3 F (36.8 C) (04/23 1745) Pulse Rate:  [88] 88 (04/23 1745) Resp:  [18] 18 (04/23 1745) BP: (153)/(77) 153/77 mmHg (04/23 1745) SpO2:  [94 %] 94 % (04/23 1745) There is no height or weight on file to calculate BMI.  Physical Exam: Thin BM, cooperative, poor dentition. Odiferous. Moves UE without C/O pain. Right hip diffusely tender with pain on attempts at ROM. 3+ edema right calf, 1+ left, no cords minimally tender. Able to dorsi and plantar flex with fair strength. LLE no gross bone or joint instability.     Assessment/Plan: Impression:  1 Subacute right femoral neck fracture 2 IDDM OOC 3 HTN Treatment: I have discussed with [name] [name] and his family treatment options and risks vs benefits thereof. Recommend hemiarthroplasty and details reviewed. I had hoped to proceed with surgery this evening. However CBG >500,  Surgery will need to be scheduled when normoglycemia reestablished.  [name] 02/02/2013, 8:25 PM

## 2013-02-03 ENCOUNTER — Encounter (HOSPITAL_COMMUNITY): Payer: Self-pay | Admitting: *Deleted

## 2013-02-03 DIAGNOSIS — M79609 Pain in unspecified limb: Secondary | ICD-10-CM

## 2013-02-03 DIAGNOSIS — R609 Edema, unspecified: Secondary | ICD-10-CM

## 2013-02-03 LAB — CBC
HCT: 29.7 % — ABNORMAL LOW (ref 39.0–52.0)
Hemoglobin: 10.1 g/dL — ABNORMAL LOW (ref 13.0–17.0)
MCH: 29.4 pg (ref 26.0–34.0)
MCHC: 34 g/dL (ref 30.0–36.0)
MCV: 86.3 fL (ref 78.0–100.0)
Platelets: 260 10*3/uL (ref 150–400)
RBC: 3.44 MIL/uL — ABNORMAL LOW (ref 4.22–5.81)
RDW: 12.7 % (ref 11.5–15.5)
WBC: 9.6 10*3/uL (ref 4.0–10.5)

## 2013-02-03 LAB — GLUCOSE, CAPILLARY
Glucose-Capillary: 103 mg/dL — ABNORMAL HIGH (ref 70–99)
Glucose-Capillary: 107 mg/dL — ABNORMAL HIGH (ref 70–99)
Glucose-Capillary: 122 mg/dL — ABNORMAL HIGH (ref 70–99)
Glucose-Capillary: 124 mg/dL — ABNORMAL HIGH (ref 70–99)
Glucose-Capillary: 125 mg/dL — ABNORMAL HIGH (ref 70–99)
Glucose-Capillary: 154 mg/dL — ABNORMAL HIGH (ref 70–99)
Glucose-Capillary: 156 mg/dL — ABNORMAL HIGH (ref 70–99)
Glucose-Capillary: 226 mg/dL — ABNORMAL HIGH (ref 70–99)
Glucose-Capillary: 299 mg/dL — ABNORMAL HIGH (ref 70–99)
Glucose-Capillary: 511 mg/dL — ABNORMAL HIGH (ref 70–99)

## 2013-02-03 LAB — BASIC METABOLIC PANEL
BUN: 15 mg/dL (ref 6–23)
CO2: 32 mEq/L (ref 19–32)
Calcium: 8.9 mg/dL (ref 8.4–10.5)
Chloride: 104 mEq/L (ref 96–112)
Creatinine, Ser: 0.95 mg/dL (ref 0.50–1.35)
GFR calc Af Amer: >90 mL/min (ref >90)
GFR calc non Af Amer: 84 mL/min — ABNORMAL LOW (ref >90)
Glucose, Bld: 72 mg/dL (ref 70–99)
Potassium: 3.3 mEq/L — ABNORMAL LOW (ref 3.5–5.1)
Sodium: 142 mEq/L (ref 135–145)

## 2013-02-03 LAB — ABO/RH: ABO/RH(D): B POS

## 2013-02-03 LAB — HEMOGLOBIN A1C
Hgb A1c MFr Bld: 9.4 % — ABNORMAL HIGH (ref <5.7)
Mean Plasma Glucose: 223 mg/dL — ABNORMAL HIGH (ref <117)

## 2013-02-03 LAB — SURGICAL PCR SCREEN
MRSA, PCR: NEGATIVE
Staphylococcus aureus: POSITIVE — AB

## 2013-02-03 MED ORDER — INSULIN ASPART 100 UNIT/ML ~~LOC~~ SOLN
12.0000 [IU] | Freq: Once | SUBCUTANEOUS | Status: AC
  Administered 2013-02-03: 12 [IU] via SUBCUTANEOUS

## 2013-02-03 MED ORDER — ENOXAPARIN SODIUM 40 MG/0.4ML ~~LOC~~ SOLN
40.0000 mg | Freq: Every day | SUBCUTANEOUS | Status: AC
  Administered 2013-02-03: 40 mg via SUBCUTANEOUS
  Filled 2013-02-03: qty 0.4

## 2013-02-03 MED ORDER — OXYCODONE-ACETAMINOPHEN 5-325 MG PO TABS
1.0000 | ORAL_TABLET | ORAL | Status: DC | PRN
  Administered 2013-02-03 – 2013-02-04 (×5): 2 via ORAL
  Filled 2013-02-03: qty 2
  Filled 2013-02-03: qty 1
  Filled 2013-02-03 (×4): qty 2

## 2013-02-03 MED ORDER — INSULIN ASPART 100 UNIT/ML ~~LOC~~ SOLN
5.0000 [IU] | Freq: Three times a day (TID) | SUBCUTANEOUS | Status: DC
  Administered 2013-02-04 – 2013-02-06 (×4): 5 [IU] via SUBCUTANEOUS

## 2013-02-03 MED ORDER — INSULIN ASPART 100 UNIT/ML ~~LOC~~ SOLN: 0.0000 [IU] | Freq: Three times a day (TID) | SUBCUTANEOUS | Status: DC

## 2013-02-03 MED ORDER — INSULIN ASPART 100 UNIT/ML ~~LOC~~ SOLN
0.0000 [IU] | SUBCUTANEOUS | Status: DC
  Administered 2013-02-03: 5 [IU] via SUBCUTANEOUS
  Administered 2013-02-03: 1 [IU] via SUBCUTANEOUS
  Administered 2013-02-03: 2 [IU] via SUBCUTANEOUS
  Administered 2013-02-04: 3 [IU] via SUBCUTANEOUS
  Administered 2013-02-04: 5 [IU] via SUBCUTANEOUS
  Administered 2013-02-04: 3 [IU] via SUBCUTANEOUS
  Administered 2013-02-05: 2 [IU] via SUBCUTANEOUS
  Administered 2013-02-05: 5 [IU] via SUBCUTANEOUS
  Administered 2013-02-05 – 2013-02-06 (×5): 2 [IU] via SUBCUTANEOUS

## 2013-02-03 NOTE — Progress Notes (Signed)
[name] [name]  MRN: [id number] DOB/Age: Nov 09, 1945 67 y.o. Physician: Ander Slade, M.D.   R hip fracture  Vital Signs Temp:  [97.4 F (36.3 C)-98.8 F (37.1 C)] 97.4 F (36.3 C) (04/24 1450) Pulse Rate:  [69-88] 74 (04/24 1450) Resp:  [16-18] 16 (04/24 1450) BP: (132-186)/(61-84) 150/68 mmHg (04/24 1450) SpO2:  [94 %-100 %] 100 % (04/24 1450) Weight:  [64.411 kg (142 lb)] 64.411 kg (142 lb) (04/23 2300)  Lab Results  Recent Labs  02/02/13 1810 02/03/13 0546  WBC 9.7 9.6  HGB 10.2* 10.1*  HCT 30.6* 29.7*  PLT 297 260   BMET  Recent Labs  02/02/13 1810 02/03/13 0546  NA 134* 142  K 3.9 3.3*  CL 93* 104  CO2 32 32  GLUCOSE 645* 72  BUN 23 15  CREATININE 1.35 0.95  CALCIUM 9.6 8.9   INR  Date Value Range Status  02/02/2013 1.02  0.00 - 1.49 Final      Plan Blood sugars normalized. My partner Dr. Gladstone Lighter has kindly offered to take over orthopaedic care of [name], and he will be by this afternoon to discuss surgical timing.  Joyelle Siedlecki M 02/03/2013, 4:21 PM

## 2013-02-03 NOTE — Progress Notes (Signed)
Right lower extremity venous duplex completed.  Right:  No evidence of DVT, superficial thrombosis, or Baker's cyst.  Left:  Negative for DVT in the common femoral vein.  

## 2013-02-03 NOTE — Progress Notes (Signed)
Nutrition Brief Note  Patient identified on the Malnutrition Screening Tool (MST) Report  Body mass index is 20.96 kg/(m^2). Patient meets criteria for Normal weight based on current BMI. Pt reports that he usually weighs 163 lbs but, he intentionally lost weigh thinking it would help his hernia.   Current diet order is NPO but, pt reports that he is hungry and wants to eat. Pt states his appetite is normal and he was eating 3-4 meals plus snacks daily PTA. Pt reports that he was diagnosed with diabetes in 1992 and has no questions or concerns regarding a diabetic diet. Pt states he eats a lot of vegetables, doesn't eat sweets or soda but, does drink sweet tea occasionally. Encouraged PO intake when diet advances and discouraged additional wt loss. Labs and medications reviewed.    No nutrition interventions warranted at this time. If nutrition issues arise, please consult RD.   Pryor Ochoa RD, LDN Inpatient Clinical Dietitian Pager: [phone] After Hours Pager: [phone]

## 2013-02-03 NOTE — Progress Notes (Addendum)
[facility] PROGRESS NOTE  Andr… Brading [id number] DOB: Jun 28, 1946 DOA: 02/02/2013 PCP: No primary provider on file.  Brief Narrative: 67 year old [place] American man with a history of insulin-dependent diabetes, who unfortunately does not have regular medical followup. He states that about 2 weeks ago he tripped over some wood in his mother's house, landed on his right hip. He immediately started experiencing pain, however he did not seek medical attention at that time. His brother had an old set of crutches at home and he has been using them to ambulate. A family member convinced him to come to the hospital today for evaluation where x-rays show a comminuted right intertrochanteric hip fracture. He was also found to have a CBG in the 500s to 600s. We have been asked to admit him for further evaluation and management.  Assessment/Plan:  Right hip fracture  - Patient was supposed to go to the operating room last night, however is high sugars prevent that. - He is still n.p.o., he sugars are much better controlled this morning, and hopefully he'll be scheduled for OR today.  Hyperglycemia in insulin-dependent diabetes  -He takes insulin 7030 at home, prescribed by his pain Dr. he does not have a primary care Dr.  Marland Kitchen SSI here, back to home regimen as soon as he starts eating.  Right lower extremity edema  -Could simply be from his hip fracture, however also concerned for DVT given he has been more sedentary.  -We'll order a right lower extremity venous Doppler to rule out DVT, this need not delay surgery. Pending final read, preliminary shows no DVT.   Chronic kidney disease stage II vs AKI given poor po intake past 2 weeks due to inability to walk - Cr normal in 2009-2010, no values since then -Suspect related to diabetes.  -We'll need close medical followup at time of discharge.   Anemia  -Likely anemia of chronic disease given his chronic kidney disease.  -Hemoglobin is 10.2, no  indication for transfusion at this time.  -Will order an anemia panel to see if he may benefit from iron supplementation.   DVT prophylaxis  -Lovenox.   Code Status: Full Family Communication: none  Disposition Plan: OR today  Consultants:  Orthopedic surgery  Procedures:  none  Antibiotics:   none   HPI/Subjective:  denies any complaints this morning, awaiting surgery.   Objective: Filed Vitals:   02/02/13 1745 02/02/13 2120 02/02/13 2300 02/03/13 0502  BP: 153/77 186/84  132/61  Pulse: 88 69  72  Temp: 98.3 F (36.8 C) 98.8 F (37.1 C)  98.8 F (37.1 C)  TempSrc: Oral Oral  Oral  Resp: 18 18  18   Height:   5\' 9"  (1.753 m)   Weight:   64.411 kg (142 lb)   SpO2: 94% 96%  95%    Intake/Output Summary (Last 24 hours) at 02/03/13 0804 Last data filed at 02/02/13 2219  Gross per 24 hour  Intake    100 ml  Output      0 ml  Net    100 ml   Filed Weights   02/02/13 2300  Weight: 64.411 kg (142 lb)    Exam:   General:  NAD  Cardiovascular: regular rate and rhythm, without MRG  Respiratory: good air movement, clear to auscultation throughout, no wheezing, ronchi or rales  Abdomen: soft, not tender to palpation, positive bowel sounds  MSK: 2+ edema bilateral, asymmetric R>L   Neuro: CN 2-12 grossly intact, MS 5/5 in all 4  Data Reviewed: Basic Metabolic Panel:  Recent Labs Lab 02/02/13 1810 02/03/13 0546  NA 134* 142  K 3.9 3.3*  CL 93* 104  CO2 32 32  GLUCOSE 645* 72  BUN 23 15  CREATININE 1.35 0.95  CALCIUM 9.6 8.9   CBC:  Recent Labs Lab 02/02/13 1810 02/03/13 0546  WBC 9.7 9.6  NEUTROABS 7.2  --   HGB 10.2* 10.1*  HCT 30.6* 29.7*  MCV 87.2 86.3  PLT 297 260   CBG:  Recent Labs Lab 02/03/13 0242 02/03/13 0354 02/03/13 0459 02/03/13 0615 02/03/13 0723  GLUCAP 107* 124* 103* 125* 122*    Recent Results (from the past 240 hour(s))  SURGICAL PCR SCREEN     Status: Abnormal   Collection Time    02/03/13  3:26 AM       Result Value Range Status   MRSA, PCR NEGATIVE  NEGATIVE Final   Staphylococcus aureus POSITIVE (*) NEGATIVE Final   Comment:            The Xpert SA Assay (FDA     approved for NASAL specimens     in patients over 48 years of age),     is one component of     a comprehensive surveillance     program.  Test performance has     been validated by [employer] for patients greater     than or equal to 23 year old.     It is not intended     to diagnose infection nor to     guide or monitor treatment.     Studies: Dg Chest 2 View  02/02/2013  *RADIOLOGY REPORT*  Clinical Data: 67 year old male preoperative study of fracture.  CHEST - 2 VIEW  Comparison: None.  Findings: Semi upright AP and lateral views of the chest.  Cardiac size at the upper limits of normal. Other mediastinal contours are within normal limits.  Visualized tracheal air column is within normal limits.  The lungs are clear.  No pneumothorax or effusion.  IMPRESSION: No acute cardiopulmonary abnormality.   Original Report Authenticated By: Roselyn Reef, M.D.    Dg Hip Complete Right  02/02/2013  *RADIOLOGY REPORT*  Clinical Data: 67 year old male with fall 2 weeks ago.  Pain.  RIGHT HIP - COMPLETE 2+ VIEW  Comparison: None.  Findings:  Comminuted right femoral neck fracture with varus impaction.  Evidence of periosteal reaction.  Suggestion of areas of bone resorption.  Areas of sclerosis.  Visible right femur from the trochanter distally is otherwise intact.  Grossly intact proximal left femur.  Pelvis intact.  Impression:  Subacute comminuted right femoral neck fracture with varus impaction. Given the altered bone mineralization is difficult to exclude this as a pathologic fracture.   Original Report Authenticated By: Roselyn Reef, M.D.     Scheduled Meds: . chlorhexidine  60 mL Topical Once  . enoxaparin (LOVENOX) injection  40 mg Subcutaneous QHS  . insulin aspart  0-9 Units Subcutaneous Q4H   Continuous  Infusions: . sodium chloride    . sodium chloride 100 mL/hr at 02/02/13 2120  . dextrose 5 % and 0.45% NaCl 50 mL/hr at 02/03/13 0217    Principal Problem:   Closed right hip fracture Active Problems:   DIABETES MELLITUS, TYPE II   Hyperglycemia   Edema of right lower extremity   CKD (chronic kidney disease) stage 2, GFR 60-89 ml/min   Anemia of chronic disease  Time spent:  [place], MD [facility] Pager [phone]. If 7 PM - 7 AM, please contact night-coverage at [email or web address], password [facility] 02/03/2013, 8:04 AM  LOS: 1 day

## 2013-02-03 NOTE — Progress Notes (Signed)
   CARE MANAGEMENT NOTE 02/03/2013  Patient:  [name] [name],[name] [name]   Account Number:  [id number]  Date Initiated:  02/03/2013  Documentation initiated by:  Olga Coaster  Subjective/Objective Assessment:   ADMITTED WITH HIP FRACTURE     Action/Plan:   PCP IS DR [place]; FOR SURGERY TODAY- [facility] SHORT TERM SNF AT DISCHARGE; CM FOLLOWING FOR DCP   Anticipated DC Date:  02/07/2013   Anticipated DC Plan:  [place] [facility]  CM consult          Status of service:  In process, will continue to follow Medicare Important Message given?  NA - LOS <3 / Initial given by admissions (If response is "NO", the following Medicare IM given date fields will be blank) Per UR Regulation:  Reviewed for med. necessity/level of care/duration of stay Comments:  02/03/2013- B Durinda Buzzelli RN,BSN,MHA

## 2013-02-04 LAB — CBC
HCT: 31.5 % — ABNORMAL LOW (ref 39.0–52.0)
Hemoglobin: 10.5 g/dL — ABNORMAL LOW (ref 13.0–17.0)
MCH: 29.1 pg (ref 26.0–34.0)
MCHC: 33.3 g/dL (ref 30.0–36.0)
MCV: 87.3 fL (ref 78.0–100.0)
Platelets: 281 10*3/uL (ref 150–400)
RBC: 3.61 MIL/uL — ABNORMAL LOW (ref 4.22–5.81)
RDW: 12.7 % (ref 11.5–15.5)
WBC: 7.9 10*3/uL (ref 4.0–10.5)

## 2013-02-04 LAB — GLUCOSE, CAPILLARY
Glucose-Capillary: 224 mg/dL — ABNORMAL HIGH (ref 70–99)
Glucose-Capillary: 250 mg/dL — ABNORMAL HIGH (ref 70–99)
Glucose-Capillary: 274 mg/dL — ABNORMAL HIGH (ref 70–99)
Glucose-Capillary: 295 mg/dL — ABNORMAL HIGH (ref 70–99)
Glucose-Capillary: 75 mg/dL (ref 70–99)
Glucose-Capillary: 90 mg/dL (ref 70–99)

## 2013-02-04 MED ORDER — OXYCODONE HCL 5 MG PO TABS
15.0000 mg | ORAL_TABLET | ORAL | Status: DC | PRN
  Administered 2013-02-04 – 2013-02-05 (×4): 15 mg via ORAL
  Filled 2013-02-04 (×2): qty 3
  Filled 2013-02-04: qty 2
  Filled 2013-02-04: qty 1
  Filled 2013-02-04 (×2): qty 3

## 2013-02-04 MED ORDER — INSULIN ASPART PROT & ASPART (70-30 MIX) 100 UNIT/ML ~~LOC~~ SUSP
10.0000 [IU] | Freq: Two times a day (BID) | SUBCUTANEOUS | Status: DC
  Administered 2013-02-04 – 2013-02-06 (×6): 10 [IU] via SUBCUTANEOUS
  Filled 2013-02-04: qty 10

## 2013-02-04 NOTE — Progress Notes (Signed)
[facility] PROGRESS NOTE  [name] [name] [id number] DOB: Jan 09, 1946 DOA: 02/02/2013 PCP: No primary provider on file.  Brief Narrative: 67 year old [place] American man with a history of insulin-dependent diabetes, who unfortunately does not have regular medical followup. He states that about 2 weeks ago he tripped over some wood in his mother's house, landed on his right hip. He immediately started experiencing pain, however he did not seek medical attention at that time. His brother had an old set of crutches at home and he has been using them to ambulate. A family member convinced him to come to the hospital today for evaluation where x-rays show a comminuted right intertrochanteric hip fracture. He was also found to have a CBG in the 500s to 600s. We have been asked to admit him for further evaluation and management.  Assessment/Plan:  Right hip fracture  - OR tomorrow  Hyperglycemia in insulin-dependent diabetes  - He takes insulin 7030 at home, prescribed by his pain doctor, takes 15 U BID. - will restart 10 BID here plus SSI  Right lower extremity edema  - likely due to hip fracture - DVT US negative  Chronic kidney disease stage II vs AKI given poor po intake past 2 weeks due to inability to walk - Cr normal in 2009-2010, no values since then - Suspect related to diabetes.  - We'll need close medical followup at time of discharge.   Anemia  -Likely anemia of chronic disease given his chronic kidney disease.  -Hemoglobin is 10.2, no indication for transfusion at this time.   DVT prophylaxis  -Lovenox.   Code Status: Full Family Communication: none  Disposition Plan: OR tomorrow  Consultants:  Orthopedic surgery  Procedures:  none  Antibiotics:   none   HPI/Subjective: - pain in hip worse, asking for increasing pain medications.   Objective: Filed Vitals:   02/03/13 0502 02/03/13 1450 02/03/13 2100 02/04/13 0545  BP: 132/61 150/68 161/81 177/86   Pulse: 72 74 74 67  Temp: 98.8 F (37.1 C) 97.4 F (36.3 C) 99.6 F (37.6 C) 98.5 F (36.9 C)  TempSrc: Oral Oral Oral Oral  Resp: 18 16 18 18   Height:      Weight:      SpO2: 95% 100% 95% 98%    Intake/Output Summary (Last 24 hours) at 02/04/13 0856 Last data filed at 02/04/13 [id number]  Gross per 24 hour  Intake 2313.34 ml  Output   1676 ml  Net 637.34 ml   Filed Weights   02/02/13 2300  Weight: 64.411 kg (142 lb)    Exam:   General:  NAD  Cardiovascular: regular rate and rhythm, without MRG  Respiratory: good air movement, clear to auscultation throughout, no wheezing, ronchi or rales  Abdomen: soft, not tender to palpation, positive bowel sounds  MSK: 2+ edema bilateral, asymmetric R>L   Neuro: CN 2-12 grossly intact, MS 5/5 in all 4  Data Reviewed: Basic Metabolic Panel:  Recent Labs Lab 02/02/13 1810 02/03/13 0546  NA 134* 142  K 3.9 3.3*  CL 93* 104  CO2 32 32  GLUCOSE 645* 72  BUN 23 15  CREATININE 1.35 0.95  CALCIUM 9.6 8.9   CBC:  Recent Labs Lab 02/02/13 1810 02/03/13 0546 02/04/13 0422  WBC 9.7 9.6 7.9  NEUTROABS 7.2  --   --   HGB 10.2* 10.1* 10.5*  HCT 30.6* 29.7* 31.5*  MCV 87.2 86.3 87.3  PLT 297 260 281   CBG:  Recent Labs Lab 02/03/13 1550  02/03/13 2123 02/04/13 0032 02/04/13 0512 02/04/13 0736  GLUCAP 299* 511* 295* 75 250*    Recent Results (from the past 240 hour(s))  SURGICAL PCR SCREEN     Status: Abnormal   Collection Time    02/03/13  3:26 AM      Result Value Range Status   MRSA, PCR NEGATIVE  NEGATIVE Final   Staphylococcus aureus POSITIVE (*) NEGATIVE Final   Comment:            The Xpert SA Assay (FDA     approved for NASAL specimens     in patients over 9 years of age),     is one component of     a comprehensive surveillance     program.  Test performance has     been validated by [employer] for patients greater     than or equal to 37 year old.     It is not intended     to diagnose  infection nor to     guide or monitor treatment.     Studies: Dg Chest 2 View  02/02/2013  *RADIOLOGY REPORT*  Clinical Data: 67 year old male preoperative study of fracture.  CHEST - 2 VIEW  Comparison: None.  Findings: Semi upright AP and lateral views of the chest.  Cardiac size at the upper limits of normal. Other mediastinal contours are within normal limits.  Visualized tracheal air column is within normal limits.  The lungs are clear.  No pneumothorax or effusion.  IMPRESSION: No acute cardiopulmonary abnormality.   Original Report Authenticated By: Roselyn Reef, M.D.    Dg Hip Complete Right  02/02/2013  *RADIOLOGY REPORT*  Clinical Data: 67 year old male with fall 2 weeks ago.  Pain.  RIGHT HIP - COMPLETE 2+ VIEW  Comparison: None.  Findings:  Comminuted right femoral neck fracture with varus impaction.  Evidence of periosteal reaction.  Suggestion of areas of bone resorption.  Areas of sclerosis.  Visible right femur from the trochanter distally is otherwise intact.  Grossly intact proximal left femur.  Pelvis intact.  Impression:  Subacute comminuted right femoral neck fracture with varus impaction. Given the altered bone mineralization is difficult to exclude this as a pathologic fracture.   Original Report Authenticated By: Roselyn Reef, M.D.     Scheduled Meds: . chlorhexidine  60 mL Topical Once  . insulin aspart  0-9 Units Subcutaneous Q4H  . insulin aspart  5 Units Subcutaneous TID WC  . insulin aspart protamine- aspart  10 Units Subcutaneous BID WC   Continuous Infusions: . sodium chloride 100 mL/hr (02/04/13 0801)  . sodium chloride 100 mL/hr at 02/02/13 2120  . dextrose 5 % and 0.45% NaCl 50 mL/hr at 02/03/13 0217    Principal Problem:   Closed right hip fracture Active Problems:   DIABETES MELLITUS, TYPE II   Hyperglycemia   Edema of right lower extremity   CKD (chronic kidney disease) stage 2, GFR 60-89 ml/min   Anemia of chronic disease  Time spent: [place], MD [facility] Pager [phone]. If 7 PM - 7 AM, please contact night-coverage at [email or web address], password [facility] 02/04/2013, 8:56 AM  LOS: 2 days

## 2013-02-04 NOTE — Progress Notes (Signed)
Inpatient Diabetes Program Recommendations  AACE/ADA: New Consensus Statement on Inpatient Glycemic Control (2013)  Target Ranges:  Prepandial:   less than 140 mg/dL      Peak postprandial:   less than 180 mg/dL (1-2 hours)      Critically ill patients:  140 - 180 mg/dL   Reason for Visit: Hyperglycemia  Pt states he checks his blood sugars approx 3 times/day and "it runs in the 200s."  Sees PCP in [facility] on regular basis for diabetes.  Results for MAKSYM, LAUBENSTEIN (MRN [id number]) as of 02/04/2013 11:45  Ref. Range 02/03/2013 05:46  Hemoglobin A1C Latest Range: <5.7 % 9.4 (H)  Results for KAYIN, MIZER (MRN [id number]) as of 02/04/2013 11:45  Ref. Range 02/03/2013 21:23 02/04/2013 00:32 02/04/2013 05:12 02/04/2013 07:36 02/04/2013 11:21  Glucose-Capillary Latest Range: 70-99 mg/dL 511 (H) 295 (H) 75 250 (H) 224 (H)   Large excursions in blood sugars.   Inpatient Diabetes Program Recommendations Insulin - Basal: Started 70/30 12 units bid (home dose) this morning HgbA1C: 9.4% - sub-optimal control at home  Note: May need titration of 70/30 insulin.   Will continue to follow.  Thank you. Lorenda Peck, RD, LDN, CDE Inpatient Diabetes Coordinator [phone]

## 2013-02-04 NOTE — Progress Notes (Signed)
Subjective:   Procedure(s) (LRB): ARTHROPLASTY BIPOLAR HIP (Right) Patient reports pain as 4 on 0-10 scale.  Case with his family and will do Hemiarthroplasty ,right Hip tomorrow.  Objective: Vital signs in last 24 hours: Temp:  [97.4 F (36.3 C)-99.6 F (37.6 C)] 98.5 F (36.9 C) (04/25 0545) Pulse Rate:  [67-74] 67 (04/25 0545) Resp:  [16-18] 18 (04/25 0545) BP: (150-177)/(68-86) 177/86 mmHg (04/25 0545) SpO2:  [95 %-100 %] 98 % (04/25 0545)  Intake/Output from previous day: 04/24 0701 - 04/25 0700 In: 960 [P.O.:360; I.V.:600] Out: 1376 [Urine:1375; Stool:1] Intake/Output this shift:     Recent Labs  02/02/13 1810 02/03/13 0546 02/04/13 0422  HGB 10.2* 10.1* 10.5*    Recent Labs  02/03/13 0546 02/04/13 0422  WBC 9.6 7.9  RBC 3.44* 3.61*  HCT 29.7* 31.5*  PLT 260 281    Recent Labs  02/02/13 1810 02/03/13 0546  NA 134* 142  K 3.9 3.3*  CL 93* 104  CO2 32 32  BUN 23 15  CREATININE 1.35 0.95  GLUCOSE 645* 72  CALCIUM 9.6 8.9    Recent Labs  02/02/13 1935  INR 1.02    Neurologically intact Dorsiflexion/Plantar flexion intact  Assessment/Plan:   Procedure(s) (LRB): ARTHROPLASTY BIPOLAR HIP (Right) Plan for Surgery tomorrow.  Saya Mccoll A 02/04/2013, 7:15 AM

## 2013-02-05 ENCOUNTER — Encounter (HOSPITAL_COMMUNITY)
Admission: EM | Disposition: A | Discharge status: Home Health | Payer: Self-pay | Source: Home / Self Care | Attending: Internal Medicine

## 2013-02-05 ENCOUNTER — Inpatient Hospital Stay (HOSPITAL_COMMUNITY): Payer: Medicare Other | Admitting: Anesthesiology

## 2013-02-05 ENCOUNTER — Encounter (HOSPITAL_COMMUNITY): Payer: Self-pay | Admitting: Anesthesiology

## 2013-02-05 ENCOUNTER — Inpatient Hospital Stay (HOSPITAL_COMMUNITY): Payer: Medicare Other

## 2013-02-05 DIAGNOSIS — I10 Essential (primary) hypertension: Secondary | ICD-10-CM

## 2013-02-05 HISTORY — PX: HIP ARTHROPLASTY: SHX981

## 2013-02-05 LAB — GLUCOSE, CAPILLARY
Glucose-Capillary: 64 mg/dL — ABNORMAL LOW (ref 70–99)
Glucose-Capillary: 152 mg/dL — ABNORMAL HIGH (ref 70–99)
Glucose-Capillary: 165 mg/dL — ABNORMAL HIGH (ref 70–99)
Glucose-Capillary: 148 mg/dL — ABNORMAL HIGH (ref 70–99)
Glucose-Capillary: 151 mg/dL — ABNORMAL HIGH (ref 70–99)
Glucose-Capillary: 183 mg/dL — ABNORMAL HIGH (ref 70–99)
Glucose-Capillary: 161 mg/dL — ABNORMAL HIGH (ref 70–99)

## 2013-02-05 LAB — CBC
HCT: 30.6 % — ABNORMAL LOW (ref 39.0–52.0)
Hemoglobin: 10.3 g/dL — ABNORMAL LOW (ref 13.0–17.0)
MCH: 29.3 pg (ref 26.0–34.0)
MCHC: 33.7 g/dL (ref 30.0–36.0)
MCV: 87.2 fL (ref 78.0–100.0)
Platelets: 260 10*3/uL (ref 150–400)
RBC: 3.51 MIL/uL — ABNORMAL LOW (ref 4.22–5.81)
RDW: 13 % (ref 11.5–15.5)
WBC: 6.8 10*3/uL (ref 4.0–10.5)

## 2013-02-05 LAB — BASIC METABOLIC PANEL
BUN: 8 mg/dL (ref 6–23)
CO2: 25 mEq/L (ref 19–32)
Calcium: 8.3 mg/dL — ABNORMAL LOW (ref 8.4–10.5)
Chloride: 104 mEq/L (ref 96–112)
Creatinine, Ser: 0.74 mg/dL (ref 0.50–1.35)
GFR calc Af Amer: >90 mL/min (ref >90)
GFR calc non Af Amer: >90 mL/min (ref >90)
Glucose, Bld: 159 mg/dL — ABNORMAL HIGH (ref 70–99)
Potassium: 3.3 mEq/L — ABNORMAL LOW (ref 3.5–5.1)
Sodium: 138 mEq/L (ref 135–145)

## 2013-02-05 LAB — PREPARE RBC (CROSSMATCH)

## 2013-02-05 SURGERY — HEMIARTHROPLASTY, HIP, DIRECT ANTERIOR APPROACH, FOR FRACTURE
Anesthesia: General | Site: Hip | Laterality: Right | Wound class: Clean

## 2013-02-05 MED ORDER — EPHEDRINE SULFATE 50 MG/ML IJ SOLN
INTRAMUSCULAR | Status: DC | PRN
  Administered 2013-02-05 (×2): 10 mg via INTRAVENOUS

## 2013-02-05 MED ORDER — SODIUM CHLORIDE 0.9 % IR SOLN
Status: DC | PRN
  Administered 2013-02-05: 11:00:00

## 2013-02-05 MED ORDER — LACTATED RINGERS IV SOLN
INTRAVENOUS | Status: DC | PRN
  Administered 2013-02-05 (×2): via INTRAVENOUS

## 2013-02-05 MED ORDER — DEXTROSE 5 % IV SOLN: 500.0000 mg | Freq: Four times a day (QID) | INTRAVENOUS | Status: DC | PRN

## 2013-02-05 MED ORDER — METHOCARBAMOL 500 MG PO TABS
500.0000 mg | ORAL_TABLET | Freq: Four times a day (QID) | ORAL | Status: DC | PRN
  Administered 2013-02-05 – 2013-02-09 (×12): 500 mg via ORAL
  Filled 2013-02-05 (×12): qty 1

## 2013-02-05 MED ORDER — POTASSIUM CHLORIDE CRYS ER 20 MEQ PO TBCR
30.0000 meq | EXTENDED_RELEASE_TABLET | Freq: Once | ORAL | Status: AC
  Administered 2013-02-05: 30 meq via ORAL
  Filled 2013-02-05: qty 1

## 2013-02-05 MED ORDER — CEFAZOLIN SODIUM 1-5 GM-% IV SOLN
1.0000 g | Freq: Four times a day (QID) | INTRAVENOUS | Status: AC
  Administered 2013-02-05 – 2013-02-06 (×2): 1 g via INTRAVENOUS
  Filled 2013-02-05 (×2): qty 50

## 2013-02-05 MED ORDER — LABETALOL HCL 5 MG/ML IV SOLN
INTRAVENOUS | Status: DC | PRN
  Administered 2013-02-05: 5 mg via INTRAVENOUS

## 2013-02-05 MED ORDER — SUCCINYLCHOLINE CHLORIDE 20 MG/ML IJ SOLN: INTRAMUSCULAR | Status: DC | PRN

## 2013-02-05 MED ORDER — BUPIVACAINE LIPOSOME 1.3 % IJ SUSP
20.0000 mL | Freq: Once | INTRAMUSCULAR | Status: DC
  Filled 2013-02-05: qty 20

## 2013-02-05 MED ORDER — RAMIPRIL 5 MG PO CAPS
5.0000 mg | ORAL_CAPSULE | Freq: Every day | ORAL | Status: DC
  Administered 2013-02-05 – 2013-02-06 (×2): 5 mg via ORAL
  Filled 2013-02-05 (×4): qty 1

## 2013-02-05 MED ORDER — POLYETHYLENE GLYCOL 3350 17 G PO PACK: 17.0000 g | PACK | Freq: Every day | ORAL | Status: DC | PRN

## 2013-02-05 MED ORDER — HYDROMORPHONE HCL PF 1 MG/ML IJ SOLN
0.2500 mg | INTRAMUSCULAR | Status: DC | PRN
  Administered 2013-02-05 (×4): 0.5 mg via INTRAVENOUS

## 2013-02-05 MED ORDER — SODIUM CHLORIDE 0.9 % IV SOLN
INTRAVENOUS | Status: DC | PRN
  Administered 2013-02-05: 11:00:00 via INTRAVENOUS

## 2013-02-05 MED ORDER — RIVAROXABAN 10 MG PO TABS
10.0000 mg | ORAL_TABLET | Freq: Every day | ORAL | Status: DC
  Administered 2013-02-06 – 2013-02-09 (×4): 10 mg via ORAL
  Filled 2013-02-05 (×6): qty 1

## 2013-02-05 MED ORDER — BISACODYL 10 MG RE SUPP: 10.0000 mg | Freq: Every day | RECTAL | Status: DC | PRN

## 2013-02-05 MED ORDER — SUCCINYLCHOLINE CHLORIDE 20 MG/ML IJ SOLN
INTRAMUSCULAR | Status: DC | PRN
  Administered 2013-02-05: 80 mg via INTRAVENOUS

## 2013-02-05 MED ORDER — CELECOXIB 200 MG PO CAPS
200.0000 mg | ORAL_CAPSULE | Freq: Two times a day (BID) | ORAL | Status: DC
  Administered 2013-02-05 – 2013-02-09 (×8): 200 mg via ORAL
  Filled 2013-02-05 (×9): qty 1

## 2013-02-05 MED ORDER — DEXTROSE 5 % IV SOLN: 2.0000 g | Freq: Four times a day (QID) | INTRAVENOUS | Status: DC

## 2013-02-05 MED ORDER — FLEET ENEMA 7-19 GM/118ML RE ENEM: 1.0000 | ENEMA | Freq: Once | RECTAL | Status: AC | PRN

## 2013-02-05 MED ORDER — HYDROCODONE-ACETAMINOPHEN 5-325 MG PO TABS
1.0000 | ORAL_TABLET | ORAL | Status: DC | PRN
  Administered 2013-02-05 – 2013-02-09 (×9): 2 via ORAL
  Filled 2013-02-05 (×8): qty 2

## 2013-02-05 MED ORDER — PROPOFOL 10 MG/ML IV BOLUS: INTRAVENOUS | Status: DC | PRN

## 2013-02-05 MED ORDER — ACETAMINOPHEN 650 MG RE SUPP: 650.0000 mg | Freq: Four times a day (QID) | RECTAL | Status: DC | PRN

## 2013-02-05 MED ORDER — MIDAZOLAM HCL 5 MG/5ML IJ SOLN
INTRAMUSCULAR | Status: DC | PRN
  Administered 2013-02-05: 1 mg via INTRAVENOUS

## 2013-02-05 MED ORDER — HYDROMORPHONE HCL PF 1 MG/ML IJ SOLN
INTRAMUSCULAR | Status: DC | PRN
  Administered 2013-02-05: .4 mg via INTRAVENOUS
  Administered 2013-02-05 (×2): .6 mg via INTRAVENOUS
  Administered 2013-02-05: .4 mg via INTRAVENOUS

## 2013-02-05 MED ORDER — PROPOFOL 10 MG/ML IV BOLUS
INTRAVENOUS | Status: DC | PRN
  Administered 2013-02-05: 130 mg via INTRAVENOUS

## 2013-02-05 MED ORDER — PHENOL 1.4 % MT LIQD
1.0000 | OROMUCOSAL | Status: DC | PRN
Start: 2013-02-05 — End: 2013-02-09

## 2013-02-05 MED ORDER — HYDROMORPHONE HCL PF 1 MG/ML IJ SOLN
1.0000 mg | INTRAMUSCULAR | Status: DC | PRN
  Administered 2013-02-05 – 2013-02-07 (×12): 1 mg via INTRAVENOUS
  Filled 2013-02-05 (×12): qty 1

## 2013-02-05 MED ORDER — CEFAZOLIN SODIUM-DEXTROSE 2-3 GM-% IV SOLR
2.0000 g | Freq: Once | INTRAVENOUS | Status: AC
  Administered 2013-02-05: 2 g via INTRAVENOUS
  Filled 2013-02-05: qty 50

## 2013-02-05 MED ORDER — SODIUM CHLORIDE 0.9 % IJ SOLN
INTRAMUSCULAR | Status: DC | PRN
  Administered 2013-02-05: 11:00:00

## 2013-02-05 MED ORDER — CISATRACURIUM BESYLATE (PF) 10 MG/5ML IV SOLN
INTRAVENOUS | Status: DC | PRN
  Administered 2013-02-05: 5 mg via INTRAVENOUS

## 2013-02-05 MED ORDER — OXYCODONE HCL 5 MG PO TABS
5.0000 mg | ORAL_TABLET | ORAL | Status: DC | PRN
  Administered 2013-02-05 (×2): 5 mg via ORAL
  Administered 2013-02-06 (×2): 10 mg via ORAL
  Filled 2013-02-05: qty 1
  Filled 2013-02-05 (×2): qty 2

## 2013-02-05 MED ORDER — INSULIN ASPART 100 UNIT/ML ~~LOC~~ SOLN
0.0000 [IU] | Freq: Three times a day (TID) | SUBCUTANEOUS | Status: DC
  Administered 2013-02-06: 5 [IU] via SUBCUTANEOUS
  Administered 2013-02-06: 2 [IU] via SUBCUTANEOUS
  Administered 2013-02-07: 5 [IU] via SUBCUTANEOUS
  Administered 2013-02-07: 15 [IU] via SUBCUTANEOUS
  Administered 2013-02-08: 3 [IU] via SUBCUTANEOUS
  Administered 2013-02-08: 08:00:00 via SUBCUTANEOUS
  Administered 2013-02-08: 8 [IU] via SUBCUTANEOUS
  Administered 2013-02-09: 11 [IU] via SUBCUTANEOUS

## 2013-02-05 MED ORDER — LACTATED RINGERS IV SOLN
INTRAVENOUS | Status: DC
  Administered 2013-02-05 – 2013-02-06 (×4): via INTRAVENOUS

## 2013-02-05 MED ORDER — MENTHOL 3 MG MT LOZG: 1.0000 | LOZENGE | OROMUCOSAL | Status: DC | PRN

## 2013-02-05 MED ORDER — LACTATED RINGERS IV SOLN: INTRAVENOUS | Status: DC

## 2013-02-05 MED ORDER — THROMBIN 20000 UNITS EX KIT
PACK | CUTANEOUS | Status: DC | PRN
  Administered 2013-02-05: 11:00:00 via TOPICAL

## 2013-02-05 MED ORDER — FERROUS SULFATE 325 (65 FE) MG PO TABS
325.0000 mg | ORAL_TABLET | Freq: Three times a day (TID) | ORAL | Status: DC
  Administered 2013-02-05 – 2013-02-09 (×11): 325 mg via ORAL
  Filled 2013-02-05 (×14): qty 1

## 2013-02-05 MED ORDER — ONDANSETRON HCL 4 MG PO TABS
4.0000 mg | ORAL_TABLET | Freq: Four times a day (QID) | ORAL | Status: DC | PRN
  Filled 2013-02-05: qty 1

## 2013-02-05 MED ORDER — LIDOCAINE HCL (CARDIAC) 20 MG/ML IV SOLN: INTRAVENOUS | Status: DC | PRN

## 2013-02-05 MED ORDER — SUFENTANIL CITRATE 50 MCG/ML IV SOLN
INTRAVENOUS | Status: DC | PRN
  Administered 2013-02-05 (×3): 10 ug via INTRAVENOUS
  Administered 2013-02-05: 20 ug via INTRAVENOUS

## 2013-02-05 MED ORDER — ACETAMINOPHEN 325 MG PO TABS
650.0000 mg | ORAL_TABLET | Freq: Four times a day (QID) | ORAL | Status: DC | PRN
  Administered 2013-02-07 – 2013-02-08 (×2): 650 mg via ORAL
  Filled 2013-02-05 (×2): qty 2

## 2013-02-05 MED ORDER — ONDANSETRON HCL 4 MG/2ML IJ SOLN
4.0000 mg | Freq: Four times a day (QID) | INTRAMUSCULAR | Status: DC | PRN
  Administered 2013-02-05: 4 mg via INTRAVENOUS

## 2013-02-05 MED ORDER — PROMETHAZINE HCL 25 MG/ML IJ SOLN: 6.2500 mg | INTRAMUSCULAR | Status: DC | PRN

## 2013-02-05 MED ORDER — LIDOCAINE HCL (CARDIAC) 20 MG/ML IV SOLN
INTRAVENOUS | Status: DC | PRN
  Administered 2013-02-05: 100 mg via INTRAVENOUS

## 2013-02-05 MED ORDER — ALUM & MAG HYDROXIDE-SIMETH 200-200-20 MG/5ML PO SUSP: 30.0000 mL | ORAL | Status: DC | PRN

## 2013-02-05 MED ORDER — ACETAMINOPHEN 10 MG/ML IV SOLN
INTRAVENOUS | Status: DC | PRN
  Administered 2013-02-05: 1000 mg via INTRAVENOUS

## 2013-02-05 SURGICAL SUPPLY — 51 items
BAG ZIPLOCK 12X15 (MISCELLANEOUS) ×2 IMPLANT
BLADE SAW SAG 73X25 THK (BLADE) ×1
BLADE SAW SGTL 73X25 THK (BLADE) ×1 IMPLANT
CLOTH BEACON ORANGE TIMEOUT ST (SAFETY) ×2 IMPLANT
DRAPE INCISE IOBAN 66X45 STRL (DRAPES) ×2 IMPLANT
DRAPE ORTHO SPLIT 77X108 STRL (DRAPES) ×2
DRAPE POUCH INSTRU U-SHP 10X18 (DRAPES) ×2 IMPLANT
DRAPE SURG ORHT 6 SPLT 77X108 (DRAPES) ×2 IMPLANT
DRAPE U-SHAPE 47X51 STRL (DRAPES) ×2 IMPLANT
DRSG ADAPTIC 3X8 NADH LF (GAUZE/BANDAGES/DRESSINGS) ×2 IMPLANT
DRSG AQUACEL AG ADV 3.5X 4 (GAUZE/BANDAGES/DRESSINGS) ×4 IMPLANT
DRSG MEPILEX BORDER 4X12 (GAUZE/BANDAGES/DRESSINGS) ×2 IMPLANT
DRSG TEGADERM 4X4.75 (GAUZE/BANDAGES/DRESSINGS) ×2 IMPLANT
ELECT REM PT RETURN 9FT ADLT (ELECTROSURGICAL) ×2
ELECTRODE REM PT RTRN 9FT ADLT (ELECTROSURGICAL) ×1 IMPLANT
EVACUATOR 1/8 PVC DRAIN (DRAIN) ×2 IMPLANT
GLOVE BIO SURGEON STRL SZ8 (GLOVE) ×2 IMPLANT
GLOVE BIOGEL PI IND STRL 8 (GLOVE) ×1 IMPLANT
GLOVE BIOGEL PI IND STRL 8.5 (GLOVE) ×1 IMPLANT
GLOVE BIOGEL PI INDICATOR 8 (GLOVE) ×1
GLOVE BIOGEL PI INDICATOR 8.5 (GLOVE) ×1
GLOVE ECLIPSE 8.0 STRL XLNG CF (GLOVE) ×2 IMPLANT
GLOVE SURG ORTHO 9.0 STRL STRW (GLOVE) ×2 IMPLANT
GOWN PREVENTION PLUS LG XLONG (DISPOSABLE) ×4 IMPLANT
GOWN STRL REIN XL XLG (GOWN DISPOSABLE) ×4 IMPLANT
HANDPIECE INTERPULSE COAX TIP (DISPOSABLE)
IMMOBILIZER KNEE 20 (SOFTGOODS) ×2
IMMOBILIZER KNEE 20 THIGH 36 (SOFTGOODS) ×1 IMPLANT
KIT BASIN OR (CUSTOM PROCEDURE TRAY) ×2 IMPLANT
NDL SAFETY ECLIPSE 18X1.5 (NEEDLE) ×1 IMPLANT
NEEDLE HYPO 18GX1.5 SHARP (NEEDLE) ×1
NEEDLE HYPO 22GX1.5 SAFETY (NEEDLE) ×2 IMPLANT
PACK TOTAL JOINT (CUSTOM PROCEDURE TRAY) ×2 IMPLANT
PASSER SUT SWANSON 36MM LOOP (INSTRUMENTS) ×2 IMPLANT
POSITIONER SURGICAL ARM (MISCELLANEOUS) ×2 IMPLANT
SET HNDPC FAN SPRY TIP SCT (DISPOSABLE) IMPLANT
SPONGE GAUZE 4X4 12PLY (GAUZE/BANDAGES/DRESSINGS) ×2 IMPLANT
SPONGE SURGIFOAM ABS GEL 100 (HEMOSTASIS) ×2 IMPLANT
STAPLER VISISTAT 35W (STAPLE) ×2 IMPLANT
STRIP CLOSURE SKIN 1/2X4 (GAUZE/BANDAGES/DRESSINGS) ×2 IMPLANT
SUT ETHIBOND NAB CT1 #1 30IN (SUTURE) ×4 IMPLANT
SUT VIC AB 0 CT1 27 (SUTURE) ×3
SUT VIC AB 0 CT1 27XBRD ANTBC (SUTURE) ×3 IMPLANT
SUT VIC AB 1 CT1 27 (SUTURE) ×6
SUT VIC AB 1 CT1 27XBRD ANTBC (SUTURE) ×6 IMPLANT
SUT VIC AB 2-0 CT1 27 (SUTURE) ×2
SUT VIC AB 2-0 CT1 TAPERPNT 27 (SUTURE) ×2 IMPLANT
TOWEL OR 17X26 10 PK STRL BLUE (TOWEL DISPOSABLE) ×4 IMPLANT
TOWEL OR NON WOVEN STRL DISP B (DISPOSABLE) ×2 IMPLANT
TOWER CARTRIDGE SMART MIX (DISPOSABLE) IMPLANT
TRAY FOLEY CATH 14FRSI W/METER (CATHETERS) ×2 IMPLANT

## 2013-02-05 NOTE — Preoperative (Signed)
Beta Blockers   Reason not to administer Beta Blockers:Not Applicable 

## 2013-02-05 NOTE — Anesthesia Preprocedure Evaluation (Signed)
Anesthesia Evaluation  Patient identified by MRN, date of birth, ID band Patient awake    Reviewed: Allergy & Precautions, H&P , NPO status , Patient's Chart, lab work & pertinent test results  Airway Mallampati: II TM Distance: >3 FB Neck ROM: Full    Dental  (+) Dental Advisory Given One lower tooth remaining.:   Pulmonary Current Smoker,  breath sounds clear to auscultation  Pulmonary exam normal       Cardiovascular Exercise Tolerance: Good hypertension, Rhythm:Regular Rate:Normal  ECG and CXR reviewed.  Question hypertension control. No meds at home?   Neuro/Psych negative neurological ROS  negative psych ROS   GI/Hepatic negative GI ROS, Neg liver ROS,   Endo/Other  diabetes, Poorly Controlled, Type 1, Insulin Dependent  Renal/GU Renal diseaseChronic kidney disease, type 2  negative genitourinary   Musculoskeletal negative musculoskeletal ROS (+)   Abdominal   Peds negative pediatric ROS (+)  Hematology negative hematology ROS (+)   Anesthesia Other Findings   Reproductive/Obstetrics negative OB ROS                           Anesthesia Physical Anesthesia Plan  ASA: III and emergent  Anesthesia Plan: General   Post-op Pain Management:    Induction: Intravenous  Airway Management Planned: Oral ETT  Additional Equipment:   Intra-op Plan:   Post-operative Plan: Extubation in OR  Informed Consent: I have reviewed the patients History and Physical, chart, labs and discussed the procedure including the risks, benefits and alternatives for the proposed anesthesia with the patient or authorized representative who has indicated his/her understanding and acceptance.   Dental advisory given  Plan Discussed with: CRNA  Anesthesia Plan Comments: (Discussed general versus spinal. Patient prefers general.)        Anesthesia Quick Evaluation

## 2013-02-05 NOTE — Brief Op Note (Signed)
02/02/2013 - 02/05/2013  11:51 AM  PATIENT:  [name] [name]  67 y.o. male  PRE-OPERATIVE DIAGNOSIS:  fractured right hip,Complex and Possible Pathologic FX.  POST-OPERATIVE DIAGNOSIS:  fractured right hip,Complex and Possiblt Pathologic.  PROCEDURE:  Procedure(s): ARTHROPLASTY BIPOLAR HIP (Right),Complex and specimen sent to lab.  SURGEON:  Surgeon(s) and Role:    * [name], MD - Primary  PHYSICIAN ASSISTANT: Ardeen Jourdain PA  ASSISTANTS: Ardeen Jourdain PA   ANESTHESIA:   general  EBL:  Total I/O In: 650 [I.V.:300; Blood:350] Out: 1200 [Urine:650; Blood:550]  BLOOD ADMINISTERED:One unit of packed RBC in OR. CC PRBC  DRAINS: (one ) Hemovact drain(s) in the Right Hip with  Suction Open   LOCAL MEDICATIONS USED:  BUPIVICAINE 20cc mixed with 20cc Normal Saline.  SPECIMEN:  Source of Specimen:  Right Hip  DISPOSITION OF SPECIMEN:  PATHOLOGY  COUNTS:  YES  TOURNIQUET:  * No tourniquets in log *  DICTATION: .Other Dictation: Dictation Number [id number]  PLAN OF CARE: Admit to inpatient   PATIENT DISPOSITION:  Stable in OR   Delay start of Pharmacological VTE agent (>24hrs) due to surgical blood loss or risk of bleeding: yes

## 2013-02-05 NOTE — Anesthesia Postprocedure Evaluation (Signed)
  Anesthesia Post-op Note  Patient: [name] [name]  Procedure(s) Performed: Procedure(s) (LRB): ARTHROPLASTY BIPOLAR HIP (Right)  Patient Location: PACU  Anesthesia Type: General  Level of Consciousness: awake and alert   Airway and Oxygen Therapy: Patient Spontanous Breathing  Post-op Pain: mild  Post-op Assessment: Post-op Vital signs reviewed, Patient's Cardiovascular Status Stable, Respiratory Function Stable, Patent Airway and No signs of Nausea or vomiting  Last Vitals:  Filed Vitals:   02/05/13 1235  BP:   Pulse: 76  Temp:   Resp: 12    Post-op Vital Signs: stable   Complications: No apparent anesthesia complications

## 2013-02-05 NOTE — Progress Notes (Signed)
[facility] PROGRESS NOTE  Angeldaniel Pepitone [id number] DOB: 01/07/1946 DOA: 02/02/2013 PCP: No primary provider on file.  Brief Narrative: 67 year old [place] American man with a history of insulin-dependent diabetes, who unfortunately does not have regular medical followup. He states that about 2 weeks ago he tripped over some wood in his mother's house, landed on his right hip. He immediately started experiencing pain, however he did not seek medical attention at that time. His brother had an old set of crutches at home and he has been using them to ambulate. A family member convinced him to come to the hospital today for evaluation where x-rays show a comminuted right intertrochanteric hip fracture. He was also found to have a CBG in the 500s to 600s. We have been asked to admit him for further evaluation and management.  Assessment/Plan:  Right hip fracture  - OR this morning - PT/OT after surgery  Hyperglycemia in insulin-dependent diabetes  - He takes insulin 7030 at home, prescribed by his pain doctor, takes 15 U BID. 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  - We'll need close medical followup at time of discharge.   Anemia  -Likely anemia of chronic disease given his chronic kidney disease.  -Hemoglobin is ~10, no indication for transfusion at this time.   DVT prophylaxis  -Lovenox.   Code Status: Full Family Communication: none  Disposition Plan: remain inpatient  Consultants:  Orthopedic surgery  Procedures:  none  Antibiotics:   none   HPI/Subjective: - pain in hip still bad, awaiting  surgery  Objective: Filed Vitals:   02/04/13 0545 02/04/13 1353 02/04/13 2200 02/05/13 0427  BP: 177/86 170/82 147/62 177/86  Pulse: 67 67 77 71  Temp: 98.5 F (36.9 C) 98.8 F (37.1 C) 98.7 F (37.1 C) 98.8 F (37.1 C)  TempSrc: Oral Oral Oral Oral  Resp: 18 18 18 16   Height:      Weight:      SpO2: 98% 99% 97% 95%    Intake/Output Summary (Last 24 hours) at 02/05/13 0732 Last data filed at 02/05/13 0700  Gross per 24 hour  Intake 3945.01 ml  Output   1851 ml  Net 2094.01 ml   Filed Weights   02/02/13 2300  Weight: 64.411 kg (142 lb)    Exam:   General:  NAD  Cardiovascular: regular rate and rhythm, without MRG  Respiratory: good air movement, clear to auscultation throughout, no wheezing, ronchi or rales  Abdomen: soft, not tender to palpation, positive bowel sounds  MSK: 2+ edema bilateral, asymmetric R>L   Neuro: CN 2-12 grossly intact, MS 5/5 in all 4  Data Reviewed: Basic Metabolic Panel:  Recent Labs Lab 02/02/13 1810 02/03/13 0546 02/05/13 0430  NA 134* 142 138  K 3.9 3.3* 3.3*  CL 93* 104 104  CO2 32 32 25  GLUCOSE 645* 72 159*  BUN 23 15 8   CREATININE 1.35 0.95 0.74  CALCIUM 9.6 8.9 8.3*   CBC:  Recent Labs Lab 02/02/13 1810 02/03/13 0546 02/04/13 0422 02/05/13 0430  WBC 9.7 9.6 7.9 6.8  NEUTROABS 7.2  --   --   --   HGB 10.2* 10.1* 10.5* 10.3*  HCT 30.6* 29.7* 31.5* 30.6*  MCV 87.2 86.3 87.3 87.2  PLT 297 260 281 260   CBG:  Recent Labs Lab 02/04/13 0736 02/04/13 1121 02/04/13 1617 02/04/13 2359 02/05/13 0426  GLUCAP 250* 224* 90 274* 152*    Recent Results (from the past 240 hour(s))  SURGICAL PCR SCREEN     Status: Abnormal   Collection Time    02/03/13  3:26 AM      Result Value Range Status   MRSA, PCR NEGATIVE  NEGATIVE Final   Staphylococcus aureus POSITIVE (*) NEGATIVE Final   Comment:            The Xpert SA Assay (FDA     approved for NASAL specimens     in patients over 80 years of age),     is  one component of     a comprehensive surveillance     program.  Test performance has     been validated by [employer] for patients greater     than or equal to 54 year old.     It is not intended     to diagnose infection nor to     guide or monitor treatment.     Studies: No results found.  Scheduled Meds: . chlorhexidine  60 mL Topical Once  . insulin aspart  0-9 Units Subcutaneous Q4H  . insulin aspart  5 Units Subcutaneous TID WC  . insulin aspart protamine- aspart  10 Units Subcutaneous BID WC   Continuous Infusions: . sodium chloride 100 mL/hr (02/04/13 1805)  . sodium chloride 100 mL/hr at 02/05/13 0403  . dextrose 5 % and 0.45% NaCl 50 mL/hr at 02/03/13 0217    Principal Problem:   Closed right hip fracture Active Problems:   DIABETES MELLITUS, TYPE II   Hyperglycemia   Edema of right lower extremity   CKD (chronic kidney disease) stage 2, GFR 60-89 ml/min   Anemia of chronic disease  Time spent: [place], MD [facility] Pager [phone]. If 7 PM - 7 AM, please contact night-coverage at [email or web address], password [facility] 02/05/2013, 7:32 AM  LOS: 3 days

## 2013-02-05 NOTE — Transfer of Care (Signed)
Immediate Anesthesia Transfer of Care Note  Patient: [name] [name]  Procedure(s) Performed: Procedure(s): ARTHROPLASTY BIPOLAR HIP (Right)  Patient Location: PACU  Anesthesia Type:General  Level of Consciousness: awake and alert   Airway & Oxygen Therapy: Patient Spontanous Breathing and Patient connected to face mask oxygen  Post-op Assessment: Report given to PACU RN and Post -op Vital signs reviewed and stable  Post vital signs: Reviewed and stable  Complications: No apparent anesthesia complications

## 2013-02-05 NOTE — Anesthesia Procedure Notes (Signed)
Procedure Name: Intubation Date/Time: 02/05/2013 10:05 AM Performed by: Danley Danker L Patient Re-evaluated:Patient Re-evaluated prior to inductionOxygen Delivery Method: Circle system utilized Preoxygenation: Pre-oxygenation with 100% oxygen Intubation Type: IV induction Ventilation: Mask ventilation without difficulty and Oral airway inserted - appropriate to patient size Laryngoscope Size: Mac and 4 Grade View: Grade I Tube type: Oral Tube size: 8.0 mm Number of attempts: 1 Airway Equipment and Method: Stylet Placement Confirmation: ETT inserted through vocal cords under direct vision,  breath sounds checked- equal and bilateral and positive ETCO2 Secured at: 21 cm Tube secured with: Tape Dental Injury: Teeth and Oropharynx as per pre-operative assessment  Comments: Intubation by Dr. Delma Post

## 2013-02-05 NOTE — Interval H&P Note (Signed)
History and Physical Interval Note:  02/05/2013 9:48 AM  [name] [name]  has presented today for surgery, with the diagnosis of fractured right hip  The various methods of treatment have been discussed with the patient and family. After consideration of risks, benefits and other options for treatment, the patient has consented to  Procedure(s): ARTHROPLASTY BIPOLAR HIP (Right) as a surgical intervention .  The patient's history has been reviewed, patient examined, no change in status, stable for surgery.  I have reviewed the patient's chart and labs.  Questions were answered to the patient's satisfaction.     Azeem Poorman A

## 2013-02-05 NOTE — H&P (View-Only) (Signed)
Inpatient Diabetes Program Recommendations  AACE/ADA: New Consensus Statement on Inpatient Glycemic Control (2013)  Target Ranges:  Prepandial:   less than 140 mg/dL      Peak postprandial:   less than 180 mg/dL (1-2 hours)      Critically ill patients:  140 - 180 mg/dL   Reason for Visit: Hyperglycemia  Pt states he checks his blood sugars approx 3 times/day and "it runs in the 200s."  Sees PCP in [facility] on regular basis for diabetes.  Results for NEO, TARWATER (MRN [id number]) as of 02/04/2013 11:45  Ref. Range 02/03/2013 05:46  Hemoglobin A1C Latest Range: <5.7 % 9.4 (H)  Results for SHADEN, HAUGHT (MRN [id number]) as of 02/04/2013 11:45  Ref. Range 02/03/2013 21:23 02/04/2013 00:32 02/04/2013 05:12 02/04/2013 07:36 02/04/2013 11:21  Glucose-Capillary Latest Range: 70-99 mg/dL 511 (H) 295 (H) 75 250 (H) 224 (H)   Large excursions in blood sugars.   Inpatient Diabetes Program Recommendations Insulin - Basal: Started 70/30 12 units bid (home dose) this morning HgbA1C: 9.4% - sub-optimal control at home  Note: May need titration of 70/30 insulin.   Will continue to follow.  Thank you. Lorenda Peck, RD, LDN, CDE Inpatient Diabetes Coordinator [phone]

## 2013-02-06 LAB — GLUCOSE, CAPILLARY
Glucose-Capillary: 110 mg/dL — ABNORMAL HIGH (ref 70–99)
Glucose-Capillary: 121 mg/dL — ABNORMAL HIGH (ref 70–99)
Glucose-Capillary: 125 mg/dL — ABNORMAL HIGH (ref 70–99)
Glucose-Capillary: 159 mg/dL — ABNORMAL HIGH (ref 70–99)
Glucose-Capillary: 178 mg/dL — ABNORMAL HIGH (ref 70–99)
Glucose-Capillary: 206 mg/dL — ABNORMAL HIGH (ref 70–99)
Glucose-Capillary: 34 mg/dL — CL (ref 70–99)
Glucose-Capillary: 89 mg/dL (ref 70–99)

## 2013-02-06 LAB — BASIC METABOLIC PANEL
BUN: 9 mg/dL (ref 6–23)
CO2: 26 mEq/L (ref 19–32)
Calcium: 7.6 mg/dL — ABNORMAL LOW (ref 8.4–10.5)
Chloride: 102 mEq/L (ref 96–112)
Creatinine, Ser: 0.86 mg/dL (ref 0.50–1.35)
GFR calc Af Amer: >90 mL/min (ref >90)
GFR calc non Af Amer: 88 mL/min — ABNORMAL LOW (ref >90)
Glucose, Bld: 191 mg/dL — ABNORMAL HIGH (ref 70–99)
Potassium: 3.5 mEq/L (ref 3.5–5.1)
Sodium: 134 mEq/L — ABNORMAL LOW (ref 135–145)

## 2013-02-06 LAB — CBC
HCT: 27.6 % — ABNORMAL LOW (ref 39.0–52.0)
Hemoglobin: 9.3 g/dL — ABNORMAL LOW (ref 13.0–17.0)
MCH: 29.1 pg (ref 26.0–34.0)
MCHC: 33.7 g/dL (ref 30.0–36.0)
MCV: 86.3 fL (ref 78.0–100.0)
Platelets: 217 10*3/uL (ref 150–400)
RBC: 3.2 MIL/uL — ABNORMAL LOW (ref 4.22–5.81)
RDW: 13.4 % (ref 11.5–15.5)
WBC: 7.5 10*3/uL (ref 4.0–10.5)

## 2013-02-06 MED ORDER — OXYCODONE HCL 5 MG PO TABS
5.0000 mg | ORAL_TABLET | ORAL | Status: DC | PRN
  Administered 2013-02-07 (×6): 10 mg via ORAL
  Administered 2013-02-08: 5 mg via ORAL
  Administered 2013-02-08 – 2013-02-09 (×9): 10 mg via ORAL
  Filled 2013-02-06 (×17): qty 2

## 2013-02-06 NOTE — Progress Notes (Signed)
Clinical Social Work Department BRIEF PSYCHOSOCIAL ASSESSMENT 02/06/2013  Patient:  Miley,Norman     Account Number:  [id number]     Admit date:  02/02/2013  Clinical Social Worker:  Levie Heritage  Date/Time:  02/06/2013 01:51 PM  Referred by:  Physician  Date Referred:  02/06/2013 Referred for  SNF Placement   Other Referral:   Interview type:  Patient Other interview type:    PSYCHOSOCIAL DATA Living Status:  S[place]LING Admitted from facility:   Level of care:   Primary support name:  Mick Sell Primary support relationship to patient:  FRIEND Degree of support available:   unknown    CURRENT CONCERNS  Other Concerns:    SOCIAL WORK ASSESSMENT / PLAN Met with Pt to discuss d/c plan.    Pt stated that he'd like to return home upon d/c, as he lives with his brother and feels that his brother would be able to care for him appropriately.  Pt stated that he'd like to see how he progresses with PT today and tomorrow before he makes his decision; he stated, though, that he thinks he'll d/c home.    CSW provided Pt with a SNF list.    CSW thanked Pt for his time.   Assessment/plan status:  Psychosocial Support/Ongoing Assessment of Needs Other assessment/ plan:   Information/referral to community resources:   SNF list    PATIENT'S/FAMILY'S RESPONSE TO PLAN OF CARE: Pt thanked CSW for time and assistance.   Bernita Raisin, [place] Work [phone]

## 2013-02-06 NOTE — Progress Notes (Signed)
Physical Therapy Treatment Patient Details Name: [name] [name] MRN: [id number] DOB: [birth year] Today's Date: 02/06/2013 Time: [id number] PT Time Calculation (min): 25 min  PT Assessment / Plan / Recommendation Comments on Treatment Session  Progressing well. Pt would like to d/c home at discharge.     Follow Up Recommendations  Home health PT     Does the patient have the potential to tolerate intense rehabilitation     Barriers to Discharge        Equipment Recommendations  Rolling walker with 5" wheels    Recommendations for Other Services OT consult  Frequency 7X/week   Plan Discharge plan remains appropriate    Precautions / Restrictions Precautions Precautions: Fall;Posterior Hip Precaution Booklet Issued: Yes (comment) Precaution Comments: Pt able to recall 1/3 hip precautions.  Restrictions Weight Bearing Restrictions: Yes RLE Weight Bearing: Partial weight bearing RLE Partial Weight Bearing Percentage or Pounds: 50%   Pertinent Vitals/Pain 8/10 R hip    Mobility  Bed Mobility Bed Mobility: Sit to Supine Supine to Sit: 4: Min assist Sit to Supine: 4: Min assist Details for Bed Mobility Assistance: Assist for R LE onto bed.  Transfers Transfers: Sit to Stand;Stand to Sit Sit to Stand: 4: Min assist;From chair/3-in-1 Stand to Sit: 4: Min assist;To bed Details for Transfer Assistance: VCs safety, technique, hand placement. Assist to rise, stabilize, control descent.  Ambulation/Gait Ambulation/Gait Assistance: 4: Min assist Ambulation Distance (Feet): 60 Feet Assistive device: Rolling walker Ambulation/Gait Assistance Details: VCs safety, technique, sequence. Assist to stabiliz throughout ambulation.  Gait Pattern: Step-to pattern;Step-through pattern;Decreased stride length    Exercises Total Joint Exercises Ankle Circles/Pumps: AROM;Both;10 reps;Supine Quad Sets: AROM;Both;10 reps;Supine Heel Slides: AAROM;Right;10 reps;Supine Hip ABduction/ADduction:  AAROM;Right;10 reps;Supine   PT Diagnosis: Difficulty walking  PT Problem List: Decreased strength;Decreased range of motion;Decreased activity tolerance;Decreased balance;Decreased mobility;Pain;Decreased knowledge of use of DME;Decreased knowledge of precautions PT Treatment Interventions: DME instruction;Gait training;Stair training;Functional mobility training;Therapeutic activities;Therapeutic exercise;Patient/family education   PT Goals Acute Rehab PT Goals PT Goal Formulation: With patient Time For Goal Achievement: 02/13/13 Potential to Achieve Goals: Good Pt will go Supine/Side to Sit: with supervision PT Goal: Supine/Side to Sit - Progress: Goal set today Pt will go Sit to Supine/Side: with supervision PT Goal: Sit to Supine/Side - Progress: Progressing toward goal Pt will go Sit to Stand: with supervision PT Goal: Sit to Stand - Progress: Progressing toward goal Pt will Ambulate: 51 - 150 feet;with supervision;with rolling walker PT Goal: Ambulate - Progress: Progressing toward goal  Visit Information  Last PT Received On: 02/06/13 Assistance Needed: +1    Subjective Data  Subjective: Thanks for helping me Patient Stated Goal: home. regain independence   Cognition  Cognition Arousal/Alertness: Awake/alert Behavior During Therapy: WFL for tasks assessed/performed Overall Cognitive Status: Within Functional Limits for tasks assessed    Balance     End of Session PT - End of Session Equipment Utilized During Treatment: Gait belt Activity Tolerance: Patient tolerated treatment well;Patient limited by pain Patient left: in bed;with family/visitor present   GP     Weston Anna, MPT Pager: [phone]

## 2013-02-06 NOTE — Progress Notes (Addendum)
Clinical Social Work Department CLINICAL SOCIAL WORK PLACEMENT NOTE 02/06/2013  Patient:  [name],[name]  Account Number:  [id number] Admit date:  02/02/2013  Clinical Social Worker:  Levie Heritage  Date/time:  02/06/2013 01:55 PM  Clinical Social Work is seeking post-discharge placement for this patient at the following level of care:   [place]   (*CSW will update this form in Epic as items are completed)   02/06/2013  Patient/family provided with [place] Department of Clinical Social Work's list of facilities offering this level of care within the geographic area requested by the patient (or if unable, by the patient's family).  02/06/2013  Patient/family informed of their freedom to choose among providers that offer the needed level of care, that participate in Medicare, Medicaid or managed care program needed by the patient, have an available bed and are willing to accept the patient.  02/06/2013  Patient/family informed of [facility]' ownership interest in [facility], as well as of the fact that they are under no obligation to receive care at this facility.  PASARR submitted to EDS on 02/04/2013 PASARR number received from EDS on 02/04/2013  FL2 transmitted to all facilities in geographic area requested by pt/family on   FL2 transmitted to all facilities within larger geographic area on   Patient informed that his/her managed care company has contracts with or will negotiate with  certain facilities, including the following:     Patient/family informed of bed offers received:   Patient chooses bed at  Physician recommends and patient chooses bed at    Patient to be transferred to  on   Patient to be transferred to facility by   The following physician request were entered in Epic:   Additional Comments:  Bernita Raisin, [place] Work [phone]   02/07/13-Patient plans to return home with [facility] services

## 2013-02-06 NOTE — Progress Notes (Signed)
Hypoglycemic Event  CBG: 34  Treatment: 15 GM carbohydrate snack  Symptoms: None  Follow-up CBG: Time: 2215 CBG Result: 89  Possible Reasons for Event: Inadequate meal intake  Comments/MD notified: will continue to monitor pt    [name] [name]  Remember to initiate Hypoglycemia Order Set & complete

## 2013-02-06 NOTE — Progress Notes (Signed)
[facility] PROGRESS NOTE  [name] [name] [id number] DOB: 07-27-1946 DOA: 02/02/2013 PCP: No primary provider on file.  Brief Narrative: 67 year old [place] American man with a history of insulin-dependent diabetes, who unfortunately does not have regular medical followup. He states that about 2 weeks ago he tripped over some wood in his mother's house, landed on his right hip. He immediately started experiencing pain, however he did not seek medical attention at that time. His brother had an old set of crutches at home and he has been using them to ambulate. A family member convinced him to come to the hospital today for evaluation where x-rays show a comminuted right intertrochanteric hip fracture. He was also found to have a CBG in the 500s to 600s. We have been asked to admit him for further evaluation and management.  Assessment/Plan:  Right hip fracture  - OR yesterday, waiting for PT/OT  Hyperglycemia in insulin-dependent diabetes  - He takes insulin 7030 at home, prescribed by his pain doctor, takes 15 U BID. 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  - We'll need close medical followup at time of discharge.  - renal function normalized  Anemia  -Likely anemia of chronic disease given his chronic kidney disease.  -Hemoglobin is ~10, no indication for transfusion at this time.  - 9.3 post op, will monitor  DVT prophylaxis  - Rivaroxaban per ortho postop  Code Status: Full Family Communication: none  Disposition Plan: remain inpatient  Consultants:  Orthopedic  surgery  Procedures:  none  Antibiotics:   none   HPI/Subjective: - pain better, took medications and now waiting to have effect to be able to work with PT  Objective: Filed Vitals:   02/06/13 0014 02/06/13 0400 02/06/13 0411 02/06/13 0420  BP:   138/69   Pulse:   78   Temp: 98.7 F (37.1 C)  100.4 F (38 C) 99.8 F (37.7 C)  TempSrc: Oral  Oral Oral  Resp:  18 20   Height:      Weight:      SpO2:  97% 100%     Intake/Output Summary (Last 24 hours) at 02/06/13 0931 Last data filed at 02/06/13 [id number]  Gross per 24 hour  Intake 3391.67 ml  Output   2785 ml  Net 606.67 ml   Filed Weights   02/02/13 2300  Weight: 64.411 kg (142 lb)    Exam:   General:  NAD  Cardiovascular: regular rate and rhythm, without MRG  Respiratory: good air movement, clear to auscultation throughout, no wheezing, ronchi or rales  Abdomen: soft, not tender to palpation, positive bowel sounds  MSK: edema improved,   Neuro: CN 2-12 grossly intact, MS 5/5 in all 4  Data Reviewed: Basic Metabolic Panel:  Recent Labs Lab 02/02/13 1810 02/03/13 0546 02/05/13 0430 02/06/13 0504  NA 134* 142 138 134*  K 3.9 3.3* 3.3* 3.5  CL 93* 104 104 102  CO2 32 32 25 26  GLUCOSE 645* 72 159* 191*  BUN 23 15 8  9  CREATININE 1.35 0.95 0.74 0.86  CALCIUM 9.6 8.9 8.3* 7.6*   CBC:  Recent Labs Lab 02/02/13 1810 02/03/13 0546 02/04/13 0422 02/05/13 0430 02/06/13 0504  WBC 9.7 9.6 7.9 6.8 7.5  NEUTROABS 7.2  --   --   --   --   HGB 10.2* 10.1* 10.5* 10.3* 9.3*  HCT 30.6* 29.7* 31.5* 30.6* 27.6*  MCV 87.2 86.3 87.3 87.2 86.3  PLT 297 260 281 260 217   CBG:  Recent Labs Lab 02/05/13 2049 02/06/13 0010 02/06/13 0404 02/06/13 0733 02/06/13 0813  GLUCAP 161* 159* 178* 110* 121*    Recent Results (from the past 240 hour(s))  SURGICAL PCR SCREEN     Status: Abnormal   Collection Time    02/03/13  3:26 AM      Result Value Range Status   MRSA, PCR NEGATIVE  NEGATIVE Final    Staphylococcus aureus POSITIVE (*) NEGATIVE Final   Comment:            The Xpert SA Assay (FDA     approved for NASAL specimens     in patients over 69 years of age),     is one component of     a comprehensive surveillance     program.  Test performance has     been validated by [employer] for patients greater     than or equal to 20 year old.     It is not intended     to diagnose infection nor to     guide or monitor treatment.     Studies: Dg Hip Portable 1 View Right  02/05/2013  *RADIOLOGY REPORT*  Clinical Data: Post right hip replacement  PORTABLE RIGHT HIP - 1 VIEW  Comparison: 02/02/2013  Findings: Post right hip hemiarthroplasty without evidence of fracture, hardware failure or loosening on this AP projection radiograph.  Alignment appears near anatomic. A surgical drain overlies the superior lateral aspect of the operative site.  There is a minimal amount of adjacent expected subcutaneous emphysema and soft tissue stranding.  There is a small amount of intra-articular air.  No radiopaque foreign body.  IMPRESSION:  Post right hip hemiarthroplasty without evidence of complication.   Original Report Authenticated By: Jake Seats, MD     Scheduled Meds: . celecoxib  200 mg Oral Q12H  . ferrous sulfate  325 mg Oral TID PC  . insulin aspart  0-15 Units Subcutaneous TID WC  . insulin aspart  0-9 Units Subcutaneous Q4H  . insulin aspart  5 Units Subcutaneous TID WC  . insulin aspart protamine- aspart  10 Units Subcutaneous BID WC  . ramipril  5 mg Oral Daily  . rivaroxaban  10 mg Oral Q breakfast   Continuous Infusions: . sodium chloride 100 mL/hr (02/04/13 1805)  . sodium chloride 100 mL/hr at 02/05/13 0403  . dextrose 5 % and 0.45% NaCl 50 mL/hr at 02/03/13 0217  . lactated ringers 100 mL/hr at 02/06/13 [id number]    Principal Problem:   Closed right hip fracture Active Problems:   DIABETES MELLITUS, TYPE II   Hyperglycemia   Edema of right lower extremity   CKD  (chronic kidney disease) stage 2, GFR 60-89 ml/min   Anemia of chronic disease  Time spent: [place], MD [facility] Pager [phone]. If 7 PM - 7 AM, please contact night-coverage at [email or web address], password [facility] 02/06/2013, 9:31 AM  LOS: 4 days

## 2013-02-06 NOTE — Care Management Note (Signed)
    Page 1 of 1   02/06/2013     1:23:20 PM   CARE MANAGEMENT NOTE 02/06/2013  Patient:  Holz,Zyren   Account Number:  [id number]  Date Initiated:  02/03/2013  Documentation initiated by:  Olga Coaster  Subjective/Objective Assessment:   ADMITTED WITH HIP FRACTURE     Action/Plan:   PCP IS DR Alysia Penna  [place]; FOR SURGERY TODAY- [place] VS SHORT TERM SNF AT DISCHARGE; CM FOLLOWING FOR DCP   Anticipated DC Date:  02/07/2013   Anticipated DC Plan:  [place]  CM consult      Choice offered to / List presented to:             Status of service:  In process, will continue to follow Medicare Important Message given?  NA - LOS <3 / Initial given by admissions (If response is "NO", the following Medicare IM given date fields will be blank) Date Medicare IM given:   Date Additional Medicare IM given:    Discharge Disposition:    Per UR Regulation:  Reviewed for med. necessity/level of care/duration of stay  If discussed at [place] of Stay Meetings, dates discussed:    Comments:  02/06/13 Jusitn Salsgiver RN,BSN NCM WEEKEND CM [phone] POD#1 R HIP BIPOLAR ARTHROPLASTY.RECEIVED REFERRAL FOR HHC.PT-HH.HHPT ORDERED.PATIENT PROVIDED [place] AGENCY LIST TO CHOOSE FROM.ALREADY HAS CANE,RW. LIKELY D/C TUESDAY.  02/04/13 Allene Dillon RN BSN Surgery planned for 4/26, PT consults after to help determine d/c needs.  02/03/2013- B CHANDLER RN,BSN,MHA

## 2013-02-06 NOTE — Evaluation (Signed)
Physical Therapy Evaluation Patient Details Name: [name] [name] MRN: [id number] DOB: 13-Jul-1946 Today's Date: 02/06/2013 Time: [id number] PT Time Calculation (min): 15 min  PT Assessment / Plan / Recommendation Clinical Impression  67 yo male s/p R hip hemiarthroplasty. On eval, pt required Min assist for mobility. Pt lives alone but states his brother will be there and that he is able to physically assist him as needed. Recommend HHPT vs SNF, depending on progress. Pt prefers to d/c home    PT Assessment  Patient needs continued PT services    Follow Up Recommendations  Home health PT;SNF (depending on progress)    Does the patient have the potential to tolerate intense rehabilitation      Barriers to Discharge        Equipment Recommendations  Rolling walker with 5" wheels    Recommendations for Other Services OT consult   Frequency 7X/week    Precautions / Restrictions Precautions Precautions: Fall;Posterior Hip Precaution Comments: Verbally reviewed and demonstrated hip precautons. Educated on WB status.  Restrictions Weight Bearing Restrictions: Yes RLE Weight Bearing: Partial weight bearing RLE Partial Weight Bearing Percentage or Pounds: 50%   Pertinent Vitals/Pain 8/10 R hip      Mobility  Bed Mobility Bed Mobility: Supine to Sit Supine to Sit: 4: Min assist Details for Bed Mobility Assistance: Assist for R LE off bed. VCs safety, technique, hand placement Transfers Transfers: Sit to Stand;Stand to Sit Sit to Stand: 4: Min assist;From bed;From elevated surface Stand to Sit: 4: Min assist;To chair/3-in-1;With armrests Details for Transfer Assistance: VCs safety, technique, hand placement. Assist to rise, stabilize, control descent.  Ambulation/Gait Ambulation/Gait Assistance: 4: Min assist Ambulation Distance (Feet): 40 Feet Assistive device: Rolling walker Ambulation/Gait Assistance Details: VCs safety, technique, sequence. Assist to stabiliz throughout  ambulation.  Gait Pattern: Step-to pattern;Decreased stride length    Exercises     PT Diagnosis: Difficulty walking  PT Problem List: Decreased strength;Decreased range of motion;Decreased activity tolerance;Decreased balance;Decreased mobility;Pain;Decreased knowledge of use of DME;Decreased knowledge of precautions PT Treatment Interventions: DME instruction;Gait training;Stair training;Functional mobility training;Therapeutic activities;Therapeutic exercise;Patient/family education   PT Goals Acute Rehab PT Goals PT Goal Formulation: With patient Time For Goal Achievement: 02/13/13 Potential to Achieve Goals: Good Pt will go Supine/Side to Sit: with supervision PT Goal: Supine/Side to Sit - Progress: Goal set today Pt will go Sit to Supine/Side: with supervision PT Goal: Sit to Supine/Side - Progress: Goal set today Pt will go Sit to Stand: with supervision PT Goal: Sit to Stand - Progress: Goal set today Pt will Ambulate: 51 - 150 feet;with supervision;with rolling walker PT Goal: Ambulate - Progress: Goal set today  Visit Information  Last PT Received On: 02/06/13 Assistance Needed: +1    Subjective Data  Subjective: I didn't think I would do this today Patient Stated Goal: home. regain independence   Prior Functioning  Home Living Lives With: Family Available Help at Discharge: Family Type of Home: House Home Access: Stairs to enter [employer] of Steps: can enter through back door-no steps Home Layout: Two level;Able to live on main level with bedroom/bathroom;1/2 bath on main level;Laundry or work area in basement [employer] Shower/Tub: Event organiser: None;Crutches Prior Function Level of Independence: Independent Able to Take Stairs?: Yes Driving: Yes Communication Communication: No difficulties    Cognition  Cognition Arousal/Alertness: Awake/alert Behavior During Therapy: WFL for tasks assessed/performed Overall  Cognitive Status: Within Functional Limits for tasks assessed    Extremity/Trunk Assessment Right Lower Extremity Assessment  RLE ROM/Strength/Tone: Deficits RLE ROM/Strength/Tone Deficits: hip flex 2/5, hip abd/add 2/5, moves ankle well Left Lower Extremity Assessment LLE ROM/Strength/Tone: WFL for tasks assessed Trunk Assessment Trunk Assessment: Normal   Balance    End of Session PT - End of Session Equipment Utilized During Treatment: Gait belt Activity Tolerance: Patient tolerated treatment well Patient left: in chair;with call bell/phone within reach  GP     Weston Anna, MPT Pager: [phone]

## 2013-02-06 NOTE — Progress Notes (Signed)
Subjective: 1 Day Post-Op Procedure(s) (LRB): ARTHROPLASTY BIPOLAR HIP (Right) Patient reports pain as 4 on 0-10 scale.  Doing well. Hemovac pulled.  Objective: Vital signs in last 24 hours: Temp:  [98 F (36.7 C)-100.4 F (38 C)] 99.8 F (37.7 C) (04/27 0420) Pulse Rate:  [70-84] 78 (04/27 0411) Resp:  [10-24] 20 (04/27 0411) BP: (136-206)/(67-107) 138/69 mmHg (04/27 0411) SpO2:  [97 %-100 %] 100 % (04/27 0411)  Intake/Output from previous day: 04/26 0701 - 04/27 0700 In: 3391.7 [P.O.:180; I.V.:2861.7; Blood:350] Out: 3085 [Urine:2040; Drains:495; Blood:550] Intake/Output this shift:     Recent Labs  02/04/13 0422 02/05/13 0430 02/06/13 0504  HGB 10.5* 10.3* 9.3*    Recent Labs  02/05/13 0430 02/06/13 0504  WBC 6.8 7.5  RBC 3.51* 3.20*  HCT 30.6* 27.6*  PLT 260 217    Recent Labs  02/05/13 0430 02/06/13 0504  NA 138 134*  K 3.3* 3.5  CL 104 102  CO2 25 26  BUN 8 9  CREATININE 0.74 0.86  GLUCOSE 159* 191*  CALCIUM 8.3* 7.6*   No results found for this basename: LABPT, INR,  in the last 72 hours  Dorsiflexion/Plantar flexion intact  Assessment/Plan: 1 Day Post-Op Procedure(s) (LRB): ARTHROPLASTY BIPOLAR HIP (Right) Up with therapy. SNF on Tuesday.  Darlis Wragg A 02/06/2013, 7:23 AM

## 2013-02-06 NOTE — Op Note (Signed)
NAMEMARTIAL, STATT                 ACCOUNT NO.:  [id number]  MEDICAL RECORD NO.:  [id number]  LOCATION:  [id number]                         FACILITY:  [facility]  PHYSICIAN:  Kipp Brood. Bernie Fobes, M.D.DATE OF BIRTH:  [birth year]  DATE OF PROCEDURE:  02/05/2013 DATE OF DISCHARGE:                              OPERATIVE REPORT   SURGEON:  Kipp Brood. Gladstone Lighter, M.D.  ASSISTANT:  Ardeen Jourdain, [place].  PREOPERATIVE DIAGNOSES: 1. Complex femoral neck fracture, right hip. 2. Possible pathologic fracture, right hip.  POSTOPERATIVE DIAGNOSES: 1. Complex femoral neck fracture, right hip. 2. Possible pathologic fracture, right hip.  OPERATION: 1. Excision of the right femoral head and the specimen was sent to     Pathology. 2. Unipolar prosthesis or hemiarthroplasty of the right hip utilizing     a size 5 Tri-Lock stem +0 neck length and a 48 mm diameter ball.  PROCEDURE:  Under general anesthesia, routine orthopedic prep followed by sterile prep was carried out of the right hip with the patient on left side, right side up.  Note, the appropriate time-out was first carried out prior to surgery.  I also marked the appropriate right leg in the holding area.  At this time, after sterile prep and draping, a posterior lateral approach to the hip was carried out.  Bleeders were identified and cauterized.  Self-retaining retractors were inserted. The incision was carried down through the iliotibial band.  I then partially detached the external rotators.  Note, there was a significant amount of venous bleeding in this area and these soft tissue did not look normal, it looked more like tumor tissue, so specimen of tumor of tissue and bone were sent to Pathology.  We continued on, cauterized all the small vessels.  I did a capsulectomy.  I then took a corkscrew and removed the femoral head, measured the head to be about a 48 to 49 mm diameter.  At this time, the head was sent to Pathology.  I then completed my  debridement of the acetabulum.  Following that, we went on and utilized our box osteotome to remove the lateral cancellous bone from the trochanteric region.  We then used our widening reamer and then a canal finder was inserted down the canal.  We then thoroughly water irrigated out the canal and then rasped the canal up to a size 5 Tri- Lock stem.  Following that, we then utilized a trial ball to measure the diameter of the acetabulum, the 48 mm fit very nicely.  So we then removed the trial components and after we went through the trials for leg length and stability, removed the components, irrigated out the canal, and inserted my permanent standard offset Tri-Lock stem size 5. Following that, we then went through the appropriate neck length again and selected a +0, 48 mm ball, reduced the hip after we made sure the acetabulum was clear of soft tissue.  Thoroughly irrigated out the area and then reapproximated the soft tissue structures over Hemovac drain. A mixture of 20 mL of Exparel with 20 mL of normal saline was used in the soft tissues.  Sterile dressings were applied.  ______________________________ Kipp Brood Gladstone Lighter, M.D.     RAG/MEDQ  D:  02/05/2013  T:  02/06/2013  Job:  [id number]

## 2013-02-07 ENCOUNTER — Inpatient Hospital Stay (HOSPITAL_COMMUNITY): Payer: Medicare Other

## 2013-02-07 DIAGNOSIS — D62 Acute posthemorrhagic anemia: Secondary | ICD-10-CM

## 2013-02-07 LAB — URINALYSIS, ROUTINE W REFLEX MICROSCOPIC
Bilirubin Urine: NEGATIVE
Glucose, UA: >1000 mg/dL — AB
Ketones, ur: 40 mg/dL — AB
Leukocytes, UA: NEGATIVE
Nitrite: NEGATIVE
Protein, ur: NEGATIVE mg/dL
Specific Gravity, Urine: 1.015 (ref 1.005–1.030)
Urobilinogen, UA: 1 mg/dL (ref 0.0–1.0)
pH: 7 (ref 5.0–8.0)

## 2013-02-07 LAB — TYPE AND SCREEN
ABO/RH(D): B POS
Antibody Screen: NEGATIVE
Unit division: 0
Unit division: 0

## 2013-02-07 LAB — URINE MICROSCOPIC-ADD ON

## 2013-02-07 LAB — BASIC METABOLIC PANEL
BUN: 9 mg/dL (ref 6–23)
CO2: 28 mEq/L (ref 19–32)
Calcium: 7.7 mg/dL — ABNORMAL LOW (ref 8.4–10.5)
Chloride: 102 mEq/L (ref 96–112)
Creatinine, Ser: 0.8 mg/dL (ref 0.50–1.35)
GFR calc Af Amer: >90 mL/min (ref >90)
GFR calc non Af Amer: >90 mL/min (ref >90)
Glucose, Bld: 147 mg/dL — ABNORMAL HIGH (ref 70–99)
Potassium: 4 mEq/L (ref 3.5–5.1)
Sodium: 135 mEq/L (ref 135–145)

## 2013-02-07 LAB — GLUCOSE, CAPILLARY
Glucose-Capillary: 254 mg/dL — ABNORMAL HIGH (ref 70–99)
Glucose-Capillary: 356 mg/dL — ABNORMAL HIGH (ref 70–99)
Glucose-Capillary: 233 mg/dL — ABNORMAL HIGH (ref 70–99)
Glucose-Capillary: 90 mg/dL (ref 70–99)
Glucose-Capillary: 248 mg/dL — ABNORMAL HIGH (ref 70–99)

## 2013-02-07 LAB — CBC
HCT: 25.9 % — ABNORMAL LOW (ref 39.0–52.0)
Hemoglobin: 8.8 g/dL — ABNORMAL LOW (ref 13.0–17.0)
MCH: 29.3 pg (ref 26.0–34.0)
MCHC: 34 g/dL (ref 30.0–36.0)
MCV: 86.3 fL (ref 78.0–100.0)
Platelets: 234 10*3/uL (ref 150–400)
RBC: 3 MIL/uL — ABNORMAL LOW (ref 4.22–5.81)
RDW: 13.7 % (ref 11.5–15.5)
WBC: 7.8 10*3/uL (ref 4.0–10.5)

## 2013-02-07 MED ORDER — INSULIN ASPART PROT & ASPART (70-30 MIX) 100 UNIT/ML ~~LOC~~ SUSP
9.0000 [IU] | Freq: Two times a day (BID) | SUBCUTANEOUS | Status: DC
  Administered 2013-02-07 – 2013-02-09 (×6): 9 [IU] via SUBCUTANEOUS
  Filled 2013-02-07: qty 10

## 2013-02-07 MED ORDER — INSULIN ASPART 100 UNIT/ML ~~LOC~~ SOLN: 4.0000 [IU] | Freq: Three times a day (TID) | SUBCUTANEOUS | Status: DC

## 2013-02-07 MED ORDER — INSULIN ASPART 100 UNIT/ML ~~LOC~~ SOLN
4.0000 [IU] | Freq: Three times a day (TID) | SUBCUTANEOUS | Status: DC
  Administered 2013-02-07: 4 [IU] via SUBCUTANEOUS
  Administered 2013-02-08: 12:00:00 via SUBCUTANEOUS
  Administered 2013-02-08 – 2013-02-09 (×3): 4 [IU] via SUBCUTANEOUS

## 2013-02-07 MED ORDER — RAMIPRIL 10 MG PO CAPS
10.0000 mg | ORAL_CAPSULE | Freq: Every day | ORAL | Status: DC
  Administered 2013-02-07 – 2013-02-09 (×3): 10 mg via ORAL
  Filled 2013-02-07 (×4): qty 1

## 2013-02-07 MED ORDER — RIVAROXABAN 10 MG PO TABS: 10.0000 mg | ORAL_TABLET | Freq: Every day | ORAL | Status: DC

## 2013-02-07 MED ORDER — FERROUS SULFATE 325 (65 FE) MG PO TABS: 325.0000 mg | ORAL_TABLET | Freq: Three times a day (TID) | ORAL | Status: DC

## 2013-02-07 MED ORDER — METHOCARBAMOL 500 MG PO TABS: 500.0000 mg | ORAL_TABLET | Freq: Four times a day (QID) | ORAL | Status: DC | PRN

## 2013-02-07 NOTE — Progress Notes (Signed)
Clinical Social Work  Per chart review, PT is recommending [facility]. CSW is signing off but available if further needs arise.  Sindy Messing, LCSW (Coverage for [employer])

## 2013-02-07 NOTE — Progress Notes (Signed)
Subjective: 2 Days Post-Op Procedure(s) (LRB): ARTHROPLASTY BIPOLAR HIP (Right) Patient reports pain as 2 on 0-10 scale.  Dressing redone. No problems today. Awaiting SNF.  Objective: Vital signs in last 24 hours: Temp:  [99.1 F (37.3 C)-100.6 F (38.1 C)] 100.6 F (38.1 C) (04/28 [id number]) Pulse Rate:  [74-87] 85 (04/28 0608) Resp:  [18-24] 20 (04/28 0608) BP: (134-173)/(68-81) 173/81 mmHg (04/28 0608) SpO2:  [95 %-100 %] 100 % (04/28 [id number])  Intake/Output from previous day: 04/27 0701 - 04/28 0700 In: 2940 [P.O.:540; I.V.:2400] Out: 1460 [Urine:1460] Intake/Output this shift:     Recent Labs  02/05/13 0430 02/06/13 0504 02/07/13 0420  HGB 10.3* 9.3* 8.8*    Recent Labs  02/06/13 0504 02/07/13 0420  WBC 7.5 7.8  RBC 3.20* 3.00*  HCT 27.6* 25.9*  PLT 217 234    Recent Labs  02/06/13 0504 02/07/13 0420  NA 134* 135  K 3.5 4.0  CL 102 102  CO2 26 28  BUN 9 9  CREATININE 0.86 0.80  GLUCOSE 191* 147*  CALCIUM 7.6* 7.7*   No results found for this basename: LABPT, INR,  in the last 72 hours  Dorsiflexion/Plantar flexion intact  Assessment/Plan: 2 Days Post-Op Procedure(s) (LRB): ARTHROPLASTY BIPOLAR HIP (Right) Up with therapy skilled nursing facility on Tuesday.  Morrie Daywalt A 02/07/2013, 7:15 AM

## 2013-02-07 NOTE — Progress Notes (Addendum)
[facility] PROGRESS NOTE  [name] [name] [id number] DOB: [birth year] DOA: 02/02/2013 PCP: No primary provider on file.  Brief Narrative: 67 year old [place] American man with a history of insulin-dependent diabetes, who unfortunately does not have regular medical followup. He states that about 2 weeks ago he tripped over some wood in his mother's house, landed on his right hip. He immediately started experiencing pain, however he did not seek medical attention at that time. His brother had an old set of crutches at home and he has been using them to ambulate. A family member convinced him to come to the hospital today for evaluation where x-rays show a comminuted right intertrochanteric hip fracture. He was also found to have a CBG in the 500s to 600s. We have been asked to admit him for further evaluation and management.  Assessment/Plan:  Right hip fracture  - OR Sat, working with PT/OT  Fever - likely post op - CXR, UA, blood cultures  Anemia  - due to #1 post op, will monitor.  - no indications for transfusion  Hyperglycemia in insulin-dependent diabetes  - He takes insulin 7030 at home, prescribed by his pain doctor, takes 15 U BID. - will restart 10 BID here plus SSI - hypoglycemic event overnight, decrease 7030 to 9 BID and scheduled to 4U TID  HTN - at home he takes Rampiril 5 mg daily which is not in our system - will restart following surgery, needs titration, increase to 10 today.  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  - We'll need close medical followup at time of discharge.  - renal function normalized  DVT prophylaxis  - Rivaroxaban per ortho postop  Code Status: Full Family Communication: none  Disposition Plan: remain  inpatient  Consultants:  Orthopedic surgery  Procedures:  none  Antibiotics:   none   HPI/Subjective: - still has some pain at the hip  Objective: Filed Vitals:   02/06/13 1800 02/06/13 2000 02/06/13 2148 02/07/13 0608  BP: 145/68  162/75 173/81  Pulse: 74  80 85  Temp: 100 F (37.8 C)  99.3 F (37.4 C) 100.6 F (38.1 C)  TempSrc: Oral  Oral Oral  Resp: 18 18 24 20   Height:      Weight:      SpO2: 99% 100% 95% 100%    Intake/Output Summary (Last 24 hours) at 02/07/13 0748 Last data filed at 02/07/13 [id number]  Gross per 24 hour  Intake   2940 ml  Output   1460 ml  Net   1480 ml   Filed Weights   02/02/13 2300  Weight: 64.411 kg (142 lb)    Exam:   General:  NAD  Cardiovascular: regular rate and rhythm, without MRG  Respiratory: good air movement, clear to auscultation throughout, no wheezing, ronchi or rales  Abdomen: soft, not tender to palpation, positive bowel sounds  MSK: edema improved,   Neuro: CN 2-12 grossly intact, MS 5/5 in all 4  Data Reviewed: Basic Metabolic Panel:  Recent Labs Lab 02/02/13 1810 02/03/13 0546 02/05/13 0430 02/06/13 0504 02/07/13 0420  NA 134* 142 138 134* 135  K 3.9 3.3* 3.3* 3.5 4.0  CL 93* 104 104 102 102  CO2 32 32 25 26 28   GLUCOSE 645* 72  159* 191* 147*  BUN 23 15 8 9 9   CREATININE 1.35 0.95 0.74 0.86 0.80  CALCIUM 9.6 8.9 8.3* 7.6* 7.7*   CBC:  Recent Labs Lab 02/02/13 1810 02/03/13 0546 02/04/13 0422 02/05/13 0430 02/06/13 0504 02/07/13 0420  WBC 9.7 9.6 7.9 6.8 7.5 7.8  NEUTROABS 7.2  --   --   --   --   --   HGB 10.2* 10.1* 10.5* 10.3* 9.3* 8.8*  HCT 30.6* 29.7* 31.5* 30.6* 27.6* 25.9*  MCV 87.2 86.3 87.3 87.2 86.3 86.3  PLT 297 260 281 260 217 234   CBG:  Recent Labs Lab 02/06/13 0813 02/06/13 1155 02/06/13 1701 02/06/13 2148 02/06/13 2217  GLUCAP 121* 206* 125* 34* 89    Recent Results (from the past 240 hour(s))  SURGICAL PCR SCREEN     Status: Abnormal   Collection Time     02/03/13  3:26 AM      Result Value Range Status   MRSA, PCR NEGATIVE  NEGATIVE Final   Staphylococcus aureus POSITIVE (*) NEGATIVE Final   Comment:            The Xpert SA Assay (FDA     approved for NASAL specimens     in patients over 55 years of age),     is one component of     a comprehensive surveillance     program.  Test performance has     been validated by [employer] for patients greater     than or equal to 69 year old.     It is not intended     to diagnose infection nor to     guide or monitor treatment.     Studies: Dg Hip Portable 1 View Right  02/05/2013  *RADIOLOGY REPORT*  Clinical Data: Post right hip replacement  PORTABLE RIGHT HIP - 1 VIEW  Comparison: 02/02/2013  Findings: Post right hip hemiarthroplasty without evidence of fracture, hardware failure or loosening on this AP projection radiograph.  Alignment appears near anatomic. A surgical drain overlies the superior lateral aspect of the operative site.  There is a minimal amount of adjacent expected subcutaneous emphysema and soft tissue stranding.  There is a small amount of intra-articular air.  No radiopaque foreign body.  IMPRESSION:  Post right hip hemiarthroplasty without evidence of complication.   Original Report Authenticated By: Jake Seats, MD     Scheduled Meds: . celecoxib  200 mg Oral Q12H  . ferrous sulfate  325 mg Oral TID PC  . insulin aspart  0-15 Units Subcutaneous TID WC  . insulin aspart  5 Units Subcutaneous TID WC  . insulin aspart protamine- aspart  10 Units Subcutaneous BID WC  . ramipril  5 mg Oral Daily  . rivaroxaban  10 mg Oral Q breakfast   Continuous Infusions: . sodium chloride 100 mL/hr (02/04/13 1805)  . sodium chloride 100 mL/hr at 02/05/13 0403  . dextrose 5 % and 0.45% NaCl 50 mL/hr at 02/03/13 0217  . lactated ringers 100 mL/hr at 02/06/13 2344    Principal Problem:   Closed right hip fracture Active Problems:   DIABETES MELLITUS, TYPE II    Hyperglycemia   Edema of right lower extremity   CKD (chronic kidney disease) stage 2, GFR 60-89 ml/min   Anemia of chronic disease   Acute blood loss anemia  Time spent: [place], MD [facility] Pager [phone]. If 7 PM - 7 AM,  please contact night-coverage at [email or web address], password [facility] 02/07/2013, 7:48 AM  LOS: 5 days

## 2013-02-07 NOTE — Evaluation (Signed)
Occupational Therapy Evaluation Patient Details Name: [name] [name] MRN: [id number] DOB: Aug 20, 1946 Today's Date: 02/07/2013 Time: [id number] OT Time Calculation (min): 21 min  OT Assessment / Plan / Recommendation Clinical Impression  Pt is s/p R hemiarthroplasty and displays decreased strength and awareness of hip precautions. He will benefit from skilled OT services to improve ADL independence and safety.    OT Assessment  Patient needs continued OT Services    Follow Up Recommendations  Home health OT;Supervision/Assistance - 24 hour    Barriers to Discharge      Equipment Recommendations  3 in 1 bedside comode    Recommendations for Other Services    Frequency  Min 2X/week    Precautions / Restrictions Precautions Precautions: Fall;Posterior Hip Precaution Booklet Issued: Yes (comment) Precaution Comments: Pt able to recall 2/3 hip precautions. reviewed hip precautions.  Restrictions Weight Bearing Restrictions: Yes RLE Weight Bearing: Partial weight bearing RLE Partial Weight Bearing Percentage or Pounds: 50%        ADL  Eating/Feeding: Performed;Independent Where Assessed - Eating/Feeding: Chair Grooming: Simulated;Wash/dry hands;Set up Where Assessed - Grooming: Supported sitting Upper Body Bathing: Simulated;Chest;Right arm;Left arm;Abdomen;Set up Where Assessed - Upper Body Bathing: Unsupported sitting Lower Body Bathing: Simulated;Moderate assistance Where Assessed - Lower Body Bathing: Supported sit to stand Upper Body Dressing: Simulated;Set up Where Assessed - Upper Body Dressing: Unsupported sitting Lower Body Dressing: Simulated;Maximal assistance Where Assessed - Lower Body Dressing: Supported sit to stand Toilet Transfer: Performed;Minimal assistance Toilet Transfer Method: Arts development officer: Therapist, occupational and Hygiene: Simulated;Minimal assistance Where Assessed - Best boy  and Hygiene: Standing Equipment Used: Rolling walker ADL Comments: Pt states a family member has all AE and he can borrow. Will further educate on AE use. Pt states he may have a 3in1 to borrow and will check on this. Pt able to state 2/3 precautions. Reviewed all with pt. Pt needs cue for safety with transfers as he doesnt put R LE out in front before sitting/standing. Pt plans to sponge bathe initially.    OT Diagnosis: Generalized weakness  OT Problem List: Decreased strength;Decreased knowledge of use of DME or AE;Decreased knowledge of precautions;Pain OT Treatment Interventions: Self-care/ADL training;Therapeutic activities;Patient/family education;DME and/or AE instruction   OT Goals Acute Rehab OT Goals OT Goal Formulation: With patient Time For Goal Achievement: 02/14/13 Potential to Achieve Goals: Good ADL Goals Pt Will Perform Grooming: with supervision;Standing at sink ADL Goal: Grooming - Progress: Goal set today Pt Will Perform Lower Body Bathing: with supervision;Sit to stand from chair;Sit to stand from bed;with adaptive equipment ADL Goal: Lower Body Bathing - Progress: Goal set today Pt Will Perform Lower Body Dressing: with supervision;Sit to stand from chair;Sit to stand from bed;with adaptive equipment ADL Goal: Lower Body Dressing - Progress: Goal set today Pt Will Transfer to Toilet: with supervision;with DME;Maintaining weight bearing status;Ambulation;3-in-1 ADL Goal: Toilet Transfer - Progress: Goal set today Pt Will Perform Toileting - Clothing Manipulation: with supervision;Standing ADL Goal: Toileting - Clothing Manipulation - Progress: Goal set today  Visit Information  Last OT Received On: 02/07/13 Assistance Needed: +1    Subjective Data  Subjective: my brother can help me Patient Stated Goal: home when able   Prior Functioning     Home Living Lives With: Family Available Help at Discharge: Family Type of Home: House Home Access: Stairs to  enter [employer] of Steps: can enter through back door-no steps [place]: Two level;Able to live on main level with  bedroom/bathroom;1/2 bath on main level;Laundry or work area in basement [employer]: Tub/shower unit (on main level) Biochemist, clinical: Standard (has higher toilet upstairs) [place]: Crutches;Sock aid;Reacher;Long-handled sponge;Long-handled shoehorn Prior Function Level of Independence: Independent Able to Take Stairs?: Yes Driving: Yes Communication Communication: No difficulties         Vision/Perception     Cognition  Cognition Arousal/Alertness: Awake/alert Behavior During Therapy: WFL for tasks assessed/performed Overall Cognitive Status: Within Functional Limits for tasks assessed    Extremity/Trunk Assessment Right Upper Extremity Assessment RUE ROM/Strength/Tone: WFL for tasks assessed Left Upper Extremity Assessment LUE ROM/Strength/Tone: WFL for tasks assessed     Mobility Bed Mobility Bed Mobility: Supine to Sit Supine to Sit: 4: Min assist Details for Bed Mobility Assistance: Assist for R LE off bed.  Transfers Transfers: Sit to Stand;Stand to Sit Sit to Stand: 4: Min assist;With upper extremity assist;From chair/3-in-1 Stand to Sit: 4: Min assist;With upper extremity assist;To chair/3-in-1 Details for Transfer Assistance: verbal cues for hand placement and R LE management     Exercise Total Joint Exercises Ankle Circles/Pumps: AROM;Both;10 reps;Supine Short Arc Quad: AROM;Right;10 reps;Supine Heel Slides: AAROM;Right;10 reps;Supine Hip ABduction/ADduction: AAROM;Right;10 reps;Supine Long Arc Quad: AAROM;Right;10 reps;Seated   Balance     End of Session OT - End of Session Activity Tolerance: Patient tolerated treatment well Patient left: in chair;with call bell/phone within reach  GO     Jules Schick [id number] 02/07/2013, 1:07 PM

## 2013-02-07 NOTE — Progress Notes (Signed)
Physical Therapy Treatment Patient Details Name: [name] [name] MRN: [id number] DOB: [birth year] Today's Date: 02/07/2013 Time: [id number] PT Time Calculation (min): 24 min  PT Assessment / Plan / Recommendation Comments on Treatment Session  Pt is progressing well with mobility, he walked 120' with RW today.  OK to DC home from PT standpoint.     Follow Up Recommendations  Home health PT     Does the patient have the potential to tolerate intense rehabilitation     Barriers to Discharge        Equipment Recommendations  Rolling walker with 5" wheels    Recommendations for Other Services OT consult  Frequency 7X/week   Plan Discharge plan remains appropriate    Precautions / Restrictions Precautions Precautions: Fall;Posterior Hip Precaution Booklet Issued: Yes (comment) Precaution Comments: Pt able to recall 1/3 hip precautions. reviewed hip precautions.  Restrictions Weight Bearing Restrictions: Yes RLE Weight Bearing: Partial weight bearing RLE Partial Weight Bearing Percentage or Pounds: 50%   Pertinent Vitals/Pain *9/10 R hip after walking Pain meds requested, ice applied**    Mobility  Bed Mobility Bed Mobility: Supine to Sit Supine to Sit: 4: Min assist Details for Bed Mobility Assistance: Assist for R LE off bed.  Transfers Transfers: Sit to Stand;Stand to Sit Sit to Stand: From bed;5: Supervision;With upper extremity assist Stand to Sit: To chair/3-in-1;With armrests;5: Supervision;With upper extremity assist Details for Transfer Assistance: VCs hand placement Ambulation/Gait Ambulation/Gait Assistance: 5: Supervision Ambulation Distance (Feet): 120 Feet Assistive device: Rolling walker Gait Pattern: Step-to pattern;Decreased stride length General Gait Details: good posture and sequencing    Exercises Total Joint Exercises Ankle Circles/Pumps: AROM;Both;10 reps;Supine Short Arc Quad: AROM;Right;10 reps;Supine Heel Slides: AAROM;Right;10 reps;Supine Hip  ABduction/ADduction: AAROM;Right;10 reps;Supine Long Arc Quad: AAROM;Right;10 reps;Seated   PT Diagnosis:    PT Problem List:   PT Treatment Interventions:     PT Goals Acute Rehab PT Goals PT Goal Formulation: With patient Time For Goal Achievement: 02/13/13 Potential to Achieve Goals: Good Pt will go Supine/Side to Sit: with supervision PT Goal: Supine/Side to Sit - Progress: Progressing toward goal Pt will go Sit to Supine/Side: with supervision Pt will go Sit to Stand: with supervision PT Goal: Sit to Stand - Progress: Met Pt will Ambulate: 51 - 150 feet;with supervision;with rolling walker PT Goal: Ambulate - Progress: Met  Visit Information  Last PT Received On: 02/07/13 Assistance Needed: +1    Subjective Data  Subjective: Thanks for helping me Patient Stated Goal: restoring antique cars   Cognition  Cognition Arousal/Alertness: Awake/alert Behavior During Therapy: WFL for tasks assessed/performed Overall Cognitive Status: Within Functional Limits for tasks assessed    Balance     End of Session PT - End of Session Activity Tolerance: Patient tolerated treatment well;Patient limited by pain Patient left: in chair;with call bell/phone within reach Nurse Communication: Mobility status   GP     [name] [name] 02/07/2013, 11:43 AM [phone]

## 2013-02-08 ENCOUNTER — Encounter (HOSPITAL_COMMUNITY): Payer: Self-pay | Admitting: Orthopedic Surgery

## 2013-02-08 LAB — GLUCOSE, CAPILLARY
Glucose-Capillary: 367 mg/dL — ABNORMAL HIGH (ref 70–99)
Glucose-Capillary: 264 mg/dL — ABNORMAL HIGH (ref 70–99)
Glucose-Capillary: 188 mg/dL — ABNORMAL HIGH (ref 70–99)
Glucose-Capillary: 199 mg/dL — ABNORMAL HIGH (ref 70–99)
Glucose-Capillary: 231 mg/dL — ABNORMAL HIGH (ref 70–99)

## 2013-02-08 LAB — CBC
HCT: 26.9 % — ABNORMAL LOW (ref 39.0–52.0)
Hemoglobin: 9 g/dL — ABNORMAL LOW (ref 13.0–17.0)
MCH: 28.8 pg (ref 26.0–34.0)
MCHC: 33.5 g/dL (ref 30.0–36.0)
MCV: 86.2 fL (ref 78.0–100.0)
Platelets: 274 10*3/uL (ref 150–400)
RBC: 3.12 MIL/uL — ABNORMAL LOW (ref 4.22–5.81)
RDW: 13.8 % (ref 11.5–15.5)
WBC: 7.7 10*3/uL (ref 4.0–10.5)

## 2013-02-08 NOTE — Progress Notes (Signed)
Physical Therapy Treatment Patient Details Name: [name] [name] MRN: [id number] DOB: October 15, 1945 Today's Date: 02/08/2013 Time: [id number] PT Time Calculation (min): 19 min  PT Assessment / Plan / Recommendation Comments on Treatment Session  progressign well, planning to D/C home    Follow Up Recommendations  Home health PT     Does the patient have the potential to tolerate intense rehabilitation     Barriers to Discharge        Equipment Recommendations  Rolling walker with 5" wheels    Recommendations for Other Services    Frequency 7X/week   Plan Discharge plan remains appropriate    Precautions / Restrictions Precautions Precautions: Fall;Posterior Hip Precaution Booklet Issued: Yes (comment) Precaution Comments: reviewed hip precautions and PWB Restrictions Weight Bearing Restrictions: Yes RLE Weight Bearing: Partial weight bearing RLE Partial Weight Bearing Percentage or Pounds: 50%   Pertinent Vitals/Pain Painful; R hip, time for meds, RN aware    Mobility  Bed Mobility Bed Mobility: Sit to Supine Supine to Sit: 4: Min assist;HOB elevated Sit to Supine: 4: Min assist Details for Bed Mobility Assistance: min assist and cues to not let toes on R roll inward. Transfers Transfers: Sit to Stand;Stand to Sit Sit to Stand: 5: Supervision;From chair/3-in-1;With upper extremity assist Stand to Sit: 5: Supervision;To bed;With upper extremity assist Details for Transfer Assistance: questioning cues for THP and hand placement; pt performs correctly Ambulation/Gait Ambulation/Gait Assistance: 5: Supervision Ambulation Distance (Feet): 120 Feet Assistive device: Rolling walker Ambulation/Gait Assistance Details: verbal cues for sequence initially Gait Pattern: Step-to pattern    Exercises Total Joint Exercises Ankle Circles/Pumps:  (too painful to complete ther ex, meds due)   PT Diagnosis:    PT Problem List:   PT Treatment Interventions:     PT Goals Acute Rehab  PT Goals Time For Goal Achievement: 02/13/13 Potential to Achieve Goals: Good Pt will go Sit to Supine/Side: with supervision PT Goal: Sit to Supine/Side - Progress: Progressing toward goal Pt will go Sit to Stand: with supervision PT Goal: Sit to Stand - Progress: Met Pt will Ambulate: 51 - 150 feet;with supervision;with rolling walker PT Goal: Ambulate - Progress: Met  Visit Information  Last PT Received On: 02/08/13 Assistance Needed: +1    Subjective Data      Cognition  Cognition Arousal/Alertness: Awake/alert Behavior During Therapy: [facility] for tasks assessed/performed Overall Cognitive Status: Within Functional Limits for tasks assessed    Balance     End of Session PT - End of Session Activity Tolerance: Patient tolerated treatment well;Patient limited by pain Patient left: in bed;with call bell/phone within reach   GP     [facility] 02/08/2013, 11:12 AM

## 2013-02-08 NOTE — Progress Notes (Signed)
[facility] PROGRESS NOTE  [name] [name] [id number] DOB: 1946/01/12 DOA: 02/02/2013 PCP: No primary provider on file.  Brief Narrative: 67 year old [place] American man with a history of insulin-dependent diabetes, who unfortunately does not have regular medical followup. He states that about 2 weeks ago he tripped over some wood in his mother's house, landed on his right hip. He immediately started experiencing pain, however he did not seek medical attention at that time. His brother had an old set of crutches at home and he has been using them to ambulate. A family member convinced him to come to the hospital today for evaluation where x-rays show a comminuted right intertrochanteric hip fracture. He was also found to have a CBG in the 500s to 600s. We have been asked to admit him for further evaluation and management.  Assessment/Plan:  Right hip fracture  - OR Sat, working with PT/OT - SNF recommendations, however wants to go home  Fever - likely post op - CXR without acute findings - UA unremarkable - blood cultures obtained 4/28 no growth to date - temperature 38 C last night, patient asymptomatic - if cultures negative for 48 hours, can probably go home 4/30.  Anemia  - due to #1 post op, will monitor.  - no indications for transfusion - stable today  Hyperglycemia in insulin-dependent diabetes  - He takes insulin 7030 at home, prescribed by his pain doctor, takes 15 U BID. - very brittle sugars with hypoglycemic event on 10 U BID, now on 9 U BID and hyperglycemic. Will monitor and correct as indicated.   HTN - at home he takes Rampiril 5 mg daily which is not in our system - will restart following surgery, needs titration, increased to 10.  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  - We'll need close medical followup at time of discharge.  - renal function normalized  DVT prophylaxis  - Rivaroxaban per ortho postop  Code Status: Full Family Communication: none  Disposition Plan: home 4/30 pending cultures  Consultants:  Orthopedic surgery  Procedures:  none  Antibiotics:   none   HPI/Subjective: - feeling better this morning   Objective: Filed Vitals:   02/07/13 1400 02/07/13 2157 02/08/13 0610 02/08/13 0800  BP: 146/66 132/54 170/66   Pulse: 79 80 84   Temp: 99.7 F (37.6 C) 100.4 F (38 C) 99.6 F (37.6 C)   TempSrc: Oral     Resp: 18 16 16 18   Height:      Weight:      SpO2: 97% 97% 93% 98%    Intake/Output Summary (Last 24 hours) at 02/08/13 1009 Last data filed at 02/08/13 0700  Gross per 24 hour  Intake   1240 ml  Output   1250 ml  Net    -10 ml   Filed Weights   02/02/13 2300  Weight: 64.411 kg (142 lb)   Exam:  General:  NAD  Cardiovascular: regular rate and rhythm, without MRG  Respiratory: good air movement, clear to auscultation throughout, no wheezing, ronchi or rales  Abdomen: soft, not tender to palpation, positive bowel sounds  MSK: edema improved,   Neuro: CN 2-12 grossly intact, MS 5/5 in all 4  Data Reviewed: Basic Metabolic Panel:  Recent  Labs Lab 02/02/13 1810 02/03/13 0546 02/05/13 0430 02/06/13 0504 02/07/13 0420  NA 134* 142 138 134* 135  K 3.9 3.3* 3.3* 3.5 4.0  CL 93* 104 104 102 102  CO2 32 32 25 26 28   GLUCOSE 645* 72 159* 191* 147*  BUN 23 15 8 9 9   CREATININE 1.35 0.95 0.74 0.86 0.80  CALCIUM 9.6 8.9 8.3* 7.6* 7.7*   CBC:  Recent Labs Lab 02/02/13 1810  02/04/13 0422 02/05/13 0430 02/06/13 0504 02/07/13 0420 02/08/13 0415  WBC 9.7  < > 7.9 6.8 7.5 7.8 7.7  NEUTROABS 7.2  --   --   --   --   --   --   HGB 10.2*  < > 10.5* 10.3* 9.3* 8.8* 9.0*  HCT 30.6*  < > 31.5* 30.6* 27.6* 25.9* 26.9*  MCV 87.2  < > 87.3 87.2 86.3 86.3 86.2  PLT 297   < > 281 260 217 234 274  < > = values in this interval not displayed. CBG:  Recent Labs Lab 02/07/13 0956 02/07/13 1148 02/07/13 1702 02/07/13 2121 02/08/13 0731  GLUCAP 356* 233* 90 248* 367*    Recent Results (from the past 240 hour(s))  SURGICAL PCR SCREEN     Status: Abnormal   Collection Time    02/03/13  3:26 AM      Result Value Range Status   MRSA, PCR NEGATIVE  NEGATIVE Final   Staphylococcus aureus POSITIVE (*) NEGATIVE Final   Comment:            The Xpert SA Assay (FDA     approved for NASAL specimens     in patients over 33 years of age),     is one component of     a comprehensive surveillance     program.  Test performance has     been validated by [employer] for patients greater     than or equal to 32 year old.     It is not intended     to diagnose infection nor to     guide or monitor treatment.  CULTURE, BLOOD (ROUTINE X 2)     Status: None   Collection Time    02/07/13 10:05 AM      Result Value Range Status   Specimen Description BLOOD LEFT ARM   Final   Special Requests BOTTLES DRAWN AEROBIC AND ANAEROBIC 8CC   Final   Culture  Setup Time 02/07/2013 15:05   Final   Culture     Final   Value:        BLOOD CULTURE RECEIVED NO GROWTH TO DATE CULTURE WILL BE HELD FOR 5 DAYS BEFORE ISSUING A FINAL NEGATIVE REPORT   Report Status PENDING   Incomplete  CULTURE, BLOOD (ROUTINE X 2)     Status: None   Collection Time    02/07/13 10:15 AM      Result Value Range Status   Specimen Description BLOOD LEFT FOREARM   Final   Special Requests BOTTLES DRAWN AEROBIC AND ANAEROBIC 10CC   Final   Culture  Setup Time 02/07/2013 15:05   Final   Culture     Final   Value:        BLOOD CULTURE RECEIVED NO GROWTH TO DATE CULTURE WILL BE HELD FOR 5 DAYS BEFORE ISSUING A FINAL NEGATIVE REPORT   Report Status PENDING   Incomplete     Studies: Dg Chest 2 View  02/07/2013  *  RADIOLOGY REPORT*  Clinical Data: Fever.  Low O2 sats.  CHEST - 2 VIEW  Comparison:  02/02/2013  Findings: Minimal left base atelectasis or scarring.  Right lung is clear.  Heart is normal size.  No effusions or acute bony abnormality.  IMPRESSION: Minimal left base atelectasis or scarring.   Original Report Authenticated By: Rolm Baptise, M.D.     Scheduled Meds: . celecoxib  200 mg Oral Q12H  . ferrous sulfate  325 mg Oral TID PC  . insulin aspart  0-15 Units Subcutaneous TID WC  . insulin aspart  4 Units Subcutaneous TID WC  . insulin aspart protamine- aspart  9 Units Subcutaneous BID WC  . ramipril  10 mg Oral Daily  . rivaroxaban  10 mg Oral Q breakfast   Continuous Infusions: . sodium chloride 100 mL/hr (02/04/13 1805)  . sodium chloride Stopped (02/07/13 1200)  . dextrose 5 % and 0.45% NaCl 50 mL/hr at 02/03/13 0217  . lactated ringers 100 mL/hr at 02/06/13 2344    Principal Problem:   Closed right hip fracture Active Problems:   DIABETES MELLITUS, TYPE II   Hyperglycemia   Edema of right lower extremity   CKD (chronic kidney disease) stage 2, GFR 60-89 ml/min   Anemia of chronic disease   Acute blood loss anemia  Time spent: [place], MD [facility] Pager [phone]. If 7 PM - 7 AM, please contact night-coverage at [email or web address], password [facility] 02/08/2013, 10:09 AM  LOS: 6 days

## 2013-02-08 NOTE — Progress Notes (Signed)
Inpatient Diabetes Program Recommendations  AACE/ADA: New Consensus Statement on Inpatient Glycemic Control (2013)  Target Ranges:  Prepandial:   less than 140 mg/dL      Peak postprandial:   less than 180 mg/dL (1-2 hours)      Critically ill patients:  140 - 180 mg/dL   Reason for Visit: Hyperglycemia  Results for [name] [name], [name] [name] (MRN [id number]) as of 02/08/2013 12:26  Ref. Range 02/07/2013 11:48 02/07/2013 17:02 02/07/2013 21:21 02/08/2013 07:31 02/08/2013 12:21  Glucose-Capillary Latest Range: 70-99 mg/dL 233 (H) 90 248 (H) 367 (H) 264 (H)    Inpatient Diabetes Program Recommendations Insulin - Basal: Increase 70/30 to 12 units bid HgbA1C: 9.4% - sub-optimal control at home  Note: For probable discharge today.

## 2013-02-08 NOTE — Progress Notes (Signed)
02/08/13 1133  PT Visit Information  Last PT Received On 02/08/13  PT Time Calculation  PT Start Time 1110  PT Stop Time 1129  PT Time Calculation (min) 19 min  Precautions  Precautions Fall;Posterior Hip  Precaution Comments reviewed hip precautions and PWB  Cognition  Arousal/Alertness Awake/alert  Behavior During Therapy WFL for tasks assessed/performed  Overall Cognitive Status Within Functional Limits for tasks assessed  Total Joint Exercises  Ankle Circles/Pumps AROM;Both;10 reps;Supine  Quad Sets AROM;Both;10 reps;Supine  Short Arc Quad AROM;Right;10 reps;Supine  Heel Slides AAROM;Right;10 reps;Supine  Hip ABduction/ADduction AAROM;Right;10 reps;Supine  PT - End of Session  Activity Tolerance Patient tolerated treatment well;Patient limited by pain  Patient left in bed;with call bell/phone within reach  PT - Assessment/Plan  Comments on Treatment Session doing well  PT Plan Discharge plan remains appropriate  Follow Up Recommendations Home health PT  PT equipment Rolling walker with 5" wheels  PT General Charges  $$ ACUTE PT VISIT 1 Procedure  PT Treatments  $Therapeutic Exercise 8-22 mins

## 2013-02-08 NOTE — Progress Notes (Signed)
Subjective: 3 Days Post-Op Procedure(s) (LRB): ARTHROPLASTY BIPOLAR HIP (Right) Patient reports pain as 2 on 0-10 scale.Doing well today. Now his family wants him to go Home,despite our advice to go to SNF. They feel they have help at home.    Objective: Vital signs in last 24 hours: Temp:  [99.6 F (37.6 C)-100.4 F (38 C)] 99.6 F (37.6 C) (04/29 0610) Pulse Rate:  [79-84] 84 (04/29 0610) Resp:  [16-18] 16 (04/29 0610) BP: (132-170)/(54-66) 170/66 mmHg (04/29 0610) SpO2:  [93 %-97 %] 93 % (04/29 0610)  Intake/Output from previous day: 04/28 0701 - 04/29 0700 In: 1920 [P.O.:1120; I.V.:800] Out: 2100 [Urine:2100] Intake/Output this shift: Total I/O In: 1000 [P.O.:1000] Out: 500 [Urine:500]   Recent Labs  02/06/13 0504 02/07/13 0420 02/08/13 0415  HGB 9.3* 8.8* 9.0*    Recent Labs  02/07/13 0420 02/08/13 0415  WBC 7.8 7.7  RBC 3.00* 3.12*  HCT 25.9* 26.9*  PLT 234 274    Recent Labs  02/06/13 0504 02/07/13 0420  NA 134* 135  K 3.5 4.0  CL 102 102  CO2 26 28  BUN 9 9  CREATININE 0.86 0.80  GLUCOSE 191* 147*  CALCIUM 7.6* 7.7*   No results found for this basename: LABPT, INR,  in the last 72 hours  Dorsiflexion/Plantar flexion intact  Assessment/Plan: 3 Days Post-Op Procedure(s) (LRB): ARTHROPLASTY BIPOLAR HIP (Right) Up with therapy discontinued Home.  Saanya Zieske A 02/08/2013, 6:49 AM

## 2013-02-08 NOTE — Progress Notes (Signed)
Occupational Therapy Treatment Patient Details Name: [name] [name] MRN: [id number] DOB: 12-Sep-1946 Today's Date: 02/08/2013 Time: [id number] OT Time Calculation (min): 25 min  OT Assessment / Plan / Recommendation Comments on Treatment Session Pt doing well. recommend HHOT to follow up. supposed to d/c today.    Follow Up Recommendations  Supervision/Assistance - 24 hour    Barriers to Discharge       Equipment Recommendations  None recommended by OT (pt can borrow 3in1)    Recommendations for Other Services    Frequency Min 2X/week   Plan Discharge plan remains appropriate    Precautions / Restrictions Precautions Precautions: Fall;Posterior Hip Precaution Booklet Issued: Yes (comment) Precaution Comments: Pt able to state 2/3 precautions. reviewed all with pt Restrictions Weight Bearing Restrictions: Yes RLE Weight Bearing: Partial weight bearing RLE Partial Weight Bearing Percentage or Pounds: 50%        ADL  Lower Body Dressing: Simulated;Supervision/safety (doff sock with reacher, don with sock aid) Where Assessed - Lower Body Dressing: Unsupported sitting Toilet Transfer: Simulated;Min guard Toilet Transfer Method: Stand pivot ADL Comments: Reviewed need to sponge bathe unless he decides to purchase a tubbench as he is currently not allowed to step into tub with PWB restriction. Pt states he will sponge initially. He also states that he has a 3in1 he can borrow and all AE except he is not 100% sure if he has a Secondary school teacher. explained coverage and where he can obtain a reacher if needed. Pt needs min cues to remember to extend R LE out in front before sit and stand.     OT Diagnosis:    OT Problem List:   OT Treatment Interventions:     OT Goals ADL Goals ADL Goal: Lower Body Dressing - Progress: Progressing toward goals ADL Goal: Toilet Transfer - Progress: Progressing toward goals  Visit Information  Last OT Received On: 02/08/13 Assistance Needed: +1    Subjective  Data  Subjective: I am doing fair Patient Stated Goal: home   Prior Functioning       Cognition  Cognition Arousal/Alertness: Awake/alert Behavior During Therapy: WFL for tasks assessed/performed Overall Cognitive Status: Within Functional Limits for tasks assessed    Mobility  Bed Mobility Bed Mobility: Supine to Sit Supine to Sit: 4: Min assist;HOB elevated Details for Bed Mobility Assistance: min assist and cues to not let toes on R roll inward. Transfers Transfers: Sit to Stand;Stand to Sit Sit to Stand: 4: Min guard;With upper extremity assist;From bed Stand to Sit: 4: Min guard;With upper extremity assist;To chair/3-in-1 Details for Transfer Assistance: min verbal cues for THPs    Exercises      Balance     End of Session OT - End of Session Activity Tolerance: Patient tolerated treatment well Patient left: in chair;with call bell/phone within reach  [place], [place] [id number] 02/08/2013, 10:16 AM

## 2013-02-09 DIAGNOSIS — S72009S Fracture of unspecified part of neck of unspecified femur, sequela: Secondary | ICD-10-CM

## 2013-02-09 LAB — GLUCOSE, CAPILLARY
Glucose-Capillary: 389 mg/dL — ABNORMAL HIGH (ref 70–99)
Glucose-Capillary: 349 mg/dL — ABNORMAL HIGH (ref 70–99)
Glucose-Capillary: 119 mg/dL — ABNORMAL HIGH (ref 70–99)

## 2013-02-09 LAB — CREATININE, SERUM
Creatinine, Ser: 0.88 mg/dL (ref 0.50–1.35)
GFR calc Af Amer: >90 mL/min (ref >90)
GFR calc non Af Amer: 87 mL/min — ABNORMAL LOW (ref >90)

## 2013-02-09 MED ORDER — INSULIN ASPART 100 UNIT/ML ~~LOC~~ SOLN
6.0000 [IU] | Freq: Once | SUBCUTANEOUS | Status: AC
  Administered 2013-02-09: 6 [IU] via SUBCUTANEOUS

## 2013-02-09 MED ORDER — INSULIN ASPART PROT & ASPART (70-30 MIX) 100 UNIT/ML ~~LOC~~ SUSP: 9.0000 [IU] | Freq: Two times a day (BID) | SUBCUTANEOUS | Status: DC

## 2013-02-09 MED ORDER — RAMIPRIL 10 MG PO CAPS: 10.0000 mg | ORAL_CAPSULE | Freq: Every day | ORAL | Status: DC

## 2013-02-09 MED ORDER — GLUCOSE BLOOD VI STRP: ORAL_STRIP | Status: DC

## 2013-02-09 MED ORDER — ONETOUCH ULTRASOFT LANCETS MISC: Status: DC

## 2013-02-09 MED ORDER — UNABLE TO FIND: Status: DC

## 2013-02-09 NOTE — Progress Notes (Signed)
   Subjective: 4 Days Post-Op Procedure(s) (LRB): ARTHROPLASTY BIPOLAR HIP (Right) Patient reports pain as mild.   Patient seen in rounds without Dr. Gladstone Lighter. Patient is well, and has had no acute complaints or problems. He is reporting that his pain is under better control. No issues overnight. No complaints of shortness of breath or chest pain.  Plan is to go Home after hospital stay.  Objective: Vital signs in last 24 hours: Temp:  [98.4 F (36.9 C)-100.1 F (37.8 C)] 100.1 F (37.8 C) (04/30 0600) Pulse Rate:  [71-78] 71 (04/30 0600) Resp:  [16-98] 16 (04/30 0600) BP: (111-172)/(50-87) 172/87 mmHg (04/30 0600) SpO2:  [96 %-98 %] 96 % (04/30 0600)  Intake/Output from previous day:  Intake/Output Summary (Last 24 hours) at 02/09/13 0722 Last data filed at 02/09/13 0600  Gross per 24 hour  Intake   1080 ml  Output    950 ml  Net    130 ml     Labs:  Recent Labs  02/07/13 0420 02/08/13 0415  HGB 8.8* 9.0*    Recent Labs  02/07/13 0420 02/08/13 0415  WBC 7.8 7.7  RBC 3.00* 3.12*  HCT 25.9* 26.9*  PLT 234 274    Recent Labs  02/07/13 0420 02/09/13 0525  NA 135  --   K 4.0  --   CL 102  --   CO2 28  --   BUN 9  --   CREATININE 0.80 0.88  GLUCOSE 147*  --   CALCIUM 7.7*  --     EXAM General - Patient is Alert and Oriented Extremity - Neurologically intact Dorsiflexion/Plantar flexion intact No cellulitis present Dressing/Incision - clean, dry, no drainage Motor Function - intact, moving foot and toes well on exam.   Past Medical History  Diagnosis Date  . Diabetes mellitus without complication   . Hypertension   . Hernia   . Back pain     Assessment/Plan: 4 Days Post-Op Procedure(s) (LRB): ARTHROPLASTY BIPOLAR HIP (Right) Principal Problem:   Closed right hip fracture Active Problems:   DIABETES MELLITUS, TYPE II   Hyperglycemia   Edema of right lower extremity   CKD (chronic kidney disease) stage 2, GFR 60-89 ml/min   Anemia of  chronic disease   Acute blood loss anemia  Estimated body mass index is 20.96 kg/(m^2) as calculated from the following:   Height as of this encounter: 5\' 9"  (1.753 m).   Weight as of this encounter: 64.411 kg (142 lb). Advance diet Up with therapy Discharge home when ready per medicine  DVT Prophylaxis - Xarelto Weight-Bearing as tolerated   Patient is ready for discharge from an orthopedic standpoint. Patient continuing to have trouble with blood sugar control. Will follow up in office in 2 weeks.   Raenell Mensing LAUREN 02/09/2013, 7:22 AM

## 2013-02-09 NOTE — Progress Notes (Signed)
Patient's CBG checked per his request, CBG found to be 389.  Paged [facility] call via the messaging system at [place] and [phone].  Paged via telephone at [place].  Will continue to monitor.

## 2013-02-09 NOTE — Discharge Summary (Addendum)
Physician Discharge Summary  [name] [name] [id number] DOB: Jan 03, 1946 DOA: 02/02/2013  PCP: No primary provider on file.  Admit date: 02/02/2013 Discharge date: 02/09/2013  Recommendations for Outpatient Follow-up:  1. Pt will need to follow up with PCP in 1 week post discharge 2. Please obtain BMP to evaluate electrolytes and kidney function 3. Please also check CBC to evaluate Hg and Hct levels 4. Follow up with Dr. Gladstone Lighter in 2 weeks  Discharge Diagnoses:  Principal Problem:   Closed right hip fracture Active Problems:   DIABETES MELLITUS, TYPE II   Hyperglycemia   Edema of right lower extremity   CKD (chronic kidney disease) stage 2, GFR 60-89 ml/min   Anemia of chronic disease   Acute blood loss anemia Right hip fracture  - Bipolar hip arthroplasty 02/05/2013 by Dr. Gladstone Lighter - working with PT/OT  - SNF recommendations, however wants to go home  -Discussed with case management to help set up home health PT Fever  - likely post op--resolved - CXR without acute findings  - UA unremarkable  - blood cultures obtained 4/28 no growth to date  - No leukocytosis, hemodynamically stable. -No fever or temp >100.4 for over 36hrs prior to d/c Anemia  - due to #1 post op, will monitor.  - no indications for transfusion  - stable --hemoglobin 9.0 on 02/08/2013; remains hemodynamically stable Hyperglycemia in insulin-dependent diabetes  - He takes insulin 7030 at home, prescribed by his pain doctor, takes 15 U BID.  - very brittle sugars with hypoglycemic event on 10 U BID, now on 9 U BID and hyperglycemic -70/30 not ideal insulin for this pt, but pt wants to f/u as outpt with PCP  -Patient with poor insight into his medical illness -Nursing staff reported the patient has intermittently refused insulin -Patient brother has also corroborated that the patient has poor followup with his primary care physician and does not follow diet recommendations -Patient was prescribed a glucometer,  test strips, lancets -Instructed the patient to keep a glycemic log and to take his log to his primary care physician for future adjustment of his insulin -Hemoglobin A1c 9.4 on 02/03/2013 HTN  - at home he takes Rampiril 5 mg daily which is not in our system  - Ramipril increased to 10 mg after surgery Right lower extremity edema  - likely due to hip fracture  - DVT US negative on 02/03/2013 Chronic kidney disease stage II vs AKI given poor po intake past 2 weeks due to inability to walk  - Cr normal in 2009-2010, no values since then  - Suspect related to diabetes.  - We'll need close medical followup at time of discharge.  - renal function normalized--serum creatinine 0.80 DVT prophylaxis  - Rivaroxaban per ortho postop   Discharge Condition: stable  Disposition:  Follow-up Information   Follow up with GIOFFRE,RONALD A, MD. Schedule an appointment as soon as possible for a visit in 2 weeks.   Contact information:   [address], [place] [address] [place] [address] [id number]       Diet: 1800 calorie ADA Wt Readings from Last 3 Encounters:  02/02/13 64.411 kg (142 lb)  02/02/13 64.411 kg (142 lb)  02/02/13 64.411 kg (142 lb)    History of present illness:  67 year old [place] American man with a history of insulin-dependent diabetes, who unfortunately does not have regular medical followup. He states that about 2 weeks ago he tripped over some wood in his mother's house, landed  on his right hip. He immediately started experiencing pain, however he did not seek medical attention at that time. His brother had an old set of crutches at home and he has been using them to ambulate. A family member convinced him to come to the hospital today for evaluation where x-rays show a comminuted right intertrochanteric hip fracture. He was also found to have a CBG in the 500s to 600s. We have been asked to admit him for further evaluation and  management.    Consultants: Orthopedics, Dr. Gladstone Lighter  Discharge Exam: Filed Vitals:   02/09/13 0600  BP: 172/87  Pulse: 71  Temp: 100.1 F (37.8 C)  Resp: 16   Filed Vitals:   02/08/13 2158 02/08/13 2328 02/09/13 0428 02/09/13 0600  BP: 127/63   172/87  Pulse: 78   71  Temp: 98.5 F (36.9 C)   100.1 F (37.8 C)  TempSrc: Oral     Resp: 16 16 16 16   Height:      Weight:      SpO2: 97%   96%   General: A&O x 3, NAD, pleasant, cooperative Cardiovascular: RRR, no rub, no gallop, no S3 Respiratory: CTAB, no wheeze, no rhonchi Abdomen:soft, nontender, nondistended, positive bowel sounds Extremities: 1+ edema, No lymphangitis, no petechiae  Discharge Instructions  Discharge Orders   Future Orders Complete By Expires     Diet - low sodium heart healthy  As directed     Discharge instructions  As directed     Comments:      See your primary care physician before resuming xanax Take insulin 70/30, 9 units before breakfast and before dinner every day Check your sugars before breakfast, lunch, dinner everyday and before bedtime, keep a log and take log to your primary care physician for adjustment of your insulin Stop ramipril 5mg  Start ramipril 10mg  daily    Increase activity slowly  As directed     Weight bearing as tolerated  As directed         Medication List    STOP taking these medications       alprazolam 2 MG tablet  Commonly known as:  XANAX     insulin lispro 100 UNIT/ML injection  Commonly known as:  HUMALOG      TAKE these medications       ferrous sulfate 325 (65 FE) MG tablet  Take 1 tablet (325 mg total) by mouth 3 (three) times daily after meals.     insulin aspart protamine- aspart (70-30) 100 UNIT/ML injection  Commonly known as:  NOVOLOG 70/30  Inject 0.09 mLs (9 Units total) into the skin 2 (two) times daily with a meal.     methocarbamol 500 MG tablet  Commonly known as:  ROBAXIN  Take 1 tablet (500 mg total) by mouth every 6 (six)  hours as needed.     oxycodone 30 MG immediate release tablet  Commonly known as:  ROXICODONE  Take 30 mg by mouth every 4 (four) hours as needed for pain.     ramipril 10 MG capsule  Commonly known as:  ALTACE  Take 1 capsule (10 mg total) by mouth daily.     rivaroxaban 10 MG Tabs tablet  Commonly known as:  XARELTO  Take 1 tablet (10 mg total) by mouth daily with breakfast.         The results of significant diagnostics from this hospitalization (including imaging, microbiology, ancillary and laboratory) are listed below for reference.  Significant Diagnostic Studies: Dg Chest 2 View  02/07/2013  *RADIOLOGY REPORT*  Clinical Data: Fever.  Low O2 sats.  CHEST - 2 VIEW  Comparison: 02/02/2013  Findings: Minimal left base atelectasis or scarring.  Right lung is clear.  Heart is normal size.  No effusions or acute bony abnormality.  IMPRESSION: Minimal left base atelectasis or scarring.   Original Report Authenticated By: Rolm Baptise, M.D.    Dg Chest 2 View  02/02/2013  *RADIOLOGY REPORT*  Clinical Data: 67 year old male preoperative study of fracture.  CHEST - 2 VIEW  Comparison: None.  Findings: Semi upright AP and lateral views of the chest.  Cardiac size at the upper limits of normal. Other mediastinal contours are within normal limits.  Visualized tracheal air column is within normal limits.  The lungs are clear.  No pneumothorax or effusion.  IMPRESSION: No acute cardiopulmonary abnormality.   Original Report Authenticated By: Roselyn Reef, M.D.    Dg Hip Complete Right  02/02/2013  *RADIOLOGY REPORT*  Clinical Data: 67 year old male with fall 2 weeks ago.  Pain.  RIGHT HIP - COMPLETE 2+ VIEW  Comparison: None.  Findings:  Comminuted right femoral neck fracture with varus impaction.  Evidence of periosteal reaction.  Suggestion of areas of bone resorption.  Areas of sclerosis.  Visible right femur from the trochanter distally is otherwise intact.  Grossly intact proximal left  femur.  Pelvis intact.  Impression:  Subacute comminuted right femoral neck fracture with varus impaction. Given the altered bone mineralization is difficult to exclude this as a pathologic fracture.   Original Report Authenticated By: Roselyn Reef, M.D.    Dg Hip Portable 1 View Right  02/05/2013  *RADIOLOGY REPORT*  Clinical Data: Post right hip replacement  PORTABLE RIGHT HIP - 1 VIEW  Comparison: 02/02/2013  Findings: Post right hip hemiarthroplasty without evidence of fracture, hardware failure or loosening on this AP projection radiograph.  Alignment appears near anatomic. A surgical drain overlies the superior lateral aspect of the operative site.  There is a minimal amount of adjacent expected subcutaneous emphysema and soft tissue stranding.  There is a small amount of intra-articular air.  No radiopaque foreign body.  IMPRESSION:  Post right hip hemiarthroplasty without evidence of complication.   Original Report Authenticated By: Jake Seats, MD      Microbiology: Recent Results (from the past 240 hour(s))  SURGICAL PCR SCREEN     Status: Abnormal   Collection Time    02/03/13  3:26 AM      Result Value Range Status   MRSA, PCR NEGATIVE  NEGATIVE Final   Staphylococcus aureus POSITIVE (*) NEGATIVE Final   Comment:            The Xpert SA Assay (FDA     approved for NASAL specimens     in patients over 59 years of age),     is one component of     a comprehensive surveillance     program.  Test performance has     been validated by [employer] for patients greater     than or equal to 1 year old.     It is not intended     to diagnose infection nor to     guide or monitor treatment.  CULTURE, BLOOD (ROUTINE X 2)     Status: None   Collection Time    02/07/13 10:05 AM      Result Value Range Status  Specimen Description BLOOD LEFT ARM   Final   Special Requests BOTTLES DRAWN AEROBIC AND ANAEROBIC 8CC   Final   Culture  Setup Time 02/07/2013 15:05   Final   Culture      Final   Value:        BLOOD CULTURE RECEIVED NO GROWTH TO DATE CULTURE WILL BE HELD FOR 5 DAYS BEFORE ISSUING A FINAL NEGATIVE REPORT   Report Status PENDING   Incomplete  CULTURE, BLOOD (ROUTINE X 2)     Status: None   Collection Time    02/07/13 10:15 AM      Result Value Range Status   Specimen Description BLOOD LEFT FOREARM   Final   Special Requests BOTTLES DRAWN AEROBIC AND ANAEROBIC 10CC   Final   Culture  Setup Time 02/07/2013 15:05   Final   Culture     Final   Value:        BLOOD CULTURE RECEIVED NO GROWTH TO DATE CULTURE WILL BE HELD FOR 5 DAYS BEFORE ISSUING A FINAL NEGATIVE REPORT   Report Status PENDING   Incomplete     Labs: Basic Metabolic Panel:  Recent Labs Lab 02/02/13 1810 02/03/13 0546 02/05/13 0430 02/06/13 0504 02/07/13 0420 02/09/13 0525  NA 134* 142 138 134* 135  --   K 3.9 3.3* 3.3* 3.5 4.0  --   CL 93* 104 104 102 102  --   CO2 32 32 25 26 28   --   GLUCOSE 645* 72 159* 191* 147*  --   BUN 23 15 8 9 9   --   CREATININE 1.35 0.95 0.74 0.86 0.80 0.88  CALCIUM 9.6 8.9 8.3* 7.6* 7.7*  --    Liver Function Tests: No results found for this basename: AST, ALT, ALKPHOS, BILITOT, PROT, ALBUMIN,  in the last 168 hours No results found for this basename: LIPASE, AMYLASE,  in the last 168 hours No results found for this basename: AMMONIA,  in the last 168 hours CBC:  Recent Labs Lab 02/02/13 1810  02/04/13 0422 02/05/13 0430 02/06/13 0504 02/07/13 0420 02/08/13 0415  WBC 9.7  < > 7.9 6.8 7.5 7.8 7.7  NEUTROABS 7.2  --   --   --   --   --   --   HGB 10.2*  < > 10.5* 10.3* 9.3* 8.8* 9.0*  HCT 30.6*  < > 31.5* 30.6* 27.6* 25.9* 26.9*  MCV 87.2  < > 87.3 87.2 86.3 86.3 86.2  PLT 297  < > 281 260 217 234 274  < > = values in this interval not displayed. Cardiac Enzymes: No results found for this basename: CKTOTAL, CKMB, CKMBINDEX, TROPONINI,  in the last 168 hours BNP: No components found with this basename: POCBNP,  CBG:  Recent Labs Lab  02/08/13 2032 02/08/13 2157 02/09/13 0430 02/09/13 0702 02/09/13 1201  GLUCAP 199* 231* 389* 349* 119*    Time coordinating discharge:  Greater than 30 minutes  Signed:  Abran Gavigan, DO [facility] Pager: [phone] 02/09/2013, 12:43 PM

## 2013-02-13 LAB — CULTURE, BLOOD (ROUTINE X 2)
Culture: NO GROWTH
Culture: NO GROWTH

## 2013-04-30 ENCOUNTER — Emergency Department (HOSPITAL_COMMUNITY): Payer: Medicare Other

## 2013-04-30 ENCOUNTER — Emergency Department (HOSPITAL_COMMUNITY)
Admission: EM | Admit: 2013-04-30 | Discharge: 2013-04-30 | Discharge status: Against Medical Advice | Payer: Medicare Other | Attending: Emergency Medicine | Admitting: Emergency Medicine

## 2013-04-30 DIAGNOSIS — T3995XA Adverse effect of unspecified nonopioid analgesic, antipyretic and antirheumatic, initial encounter: Secondary | ICD-10-CM | POA: Insufficient documentation

## 2013-04-30 DIAGNOSIS — E119 Type 2 diabetes mellitus without complications: Secondary | ICD-10-CM | POA: Insufficient documentation

## 2013-04-30 DIAGNOSIS — I10 Essential (primary) hypertension: Secondary | ICD-10-CM | POA: Insufficient documentation

## 2013-04-30 DIAGNOSIS — Z8739 Personal history of other diseases of the musculoskeletal system and connective tissue: Secondary | ICD-10-CM | POA: Insufficient documentation

## 2013-04-30 DIAGNOSIS — R4789 Other speech disturbances: Secondary | ICD-10-CM | POA: Insufficient documentation

## 2013-04-30 DIAGNOSIS — Z794 Long term (current) use of insulin: Secondary | ICD-10-CM | POA: Insufficient documentation

## 2013-04-30 DIAGNOSIS — T398X1A Poisoning by other nonopioid analgesics and antipyretics, not elsewhere classified, accidental (unintentional), initial encounter: Secondary | ICD-10-CM | POA: Insufficient documentation

## 2013-04-30 DIAGNOSIS — Z8719 Personal history of other diseases of the digestive system: Secondary | ICD-10-CM | POA: Insufficient documentation

## 2013-04-30 DIAGNOSIS — Z87891 Personal history of nicotine dependence: Secondary | ICD-10-CM | POA: Insufficient documentation

## 2013-04-30 DIAGNOSIS — Z79899 Other long term (current) drug therapy: Secondary | ICD-10-CM | POA: Insufficient documentation

## 2013-04-30 DIAGNOSIS — R4182 Altered mental status, unspecified: Secondary | ICD-10-CM | POA: Insufficient documentation

## 2013-04-30 LAB — CBC
HCT: 40.6 % (ref 39.0–52.0)
Hemoglobin: 14.4 g/dL (ref 13.0–17.0)
MCH: 30.8 pg (ref 26.0–34.0)
MCHC: 35.5 g/dL (ref 30.0–36.0)
MCV: 86.9 fL (ref 78.0–100.0)
Platelets: 273 10*3/uL (ref 150–400)
RBC: 4.67 MIL/uL (ref 4.22–5.81)
RDW: 13.7 % (ref 11.5–15.5)
WBC: 7.6 10*3/uL (ref 4.0–10.5)

## 2013-04-30 LAB — COMPREHENSIVE METABOLIC PANEL
ALT: 15 U/L (ref 0–53)
AST: 45 U/L — ABNORMAL HIGH (ref 0–37)
Albumin: 3.9 g/dL (ref 3.5–5.2)
Alkaline Phosphatase: 132 U/L — ABNORMAL HIGH (ref 39–117)
BUN: 21 mg/dL (ref 6–23)
CO2: 28 mEq/L (ref 19–32)
Calcium: 10 mg/dL (ref 8.4–10.5)
Chloride: 93 mEq/L — ABNORMAL LOW (ref 96–112)
Creatinine, Ser: 1.15 mg/dL (ref 0.50–1.35)
GFR calc Af Amer: 74 mL/min — ABNORMAL LOW (ref >90)
GFR calc non Af Amer: 64 mL/min — ABNORMAL LOW (ref >90)
Glucose, Bld: 272 mg/dL — ABNORMAL HIGH (ref 70–99)
Potassium: 4.5 mEq/L (ref 3.5–5.1)
Sodium: 134 mEq/L — ABNORMAL LOW (ref 135–145)
Total Bilirubin: 0.2 mg/dL — ABNORMAL LOW (ref 0.3–1.2)
Total Protein: 8.5 g/dL — ABNORMAL HIGH (ref 6.0–8.3)

## 2013-04-30 LAB — ETHANOL: Alcohol, Ethyl (B): <11 mg/dL (ref 0–11)

## 2013-04-30 LAB — TROPONIN I: Troponin I: <0.3 ng/mL (ref <0.30)

## 2013-04-30 LAB — ACETAMINOPHEN LEVEL: Acetaminophen (Tylenol), Serum: <15 ug/mL (ref 10–30)

## 2013-04-30 LAB — SALICYLATE LEVEL: Salicylate Lvl: <2 mg/dL — ABNORMAL LOW (ref 2.8–20.0)

## 2013-04-30 MED ORDER — NALOXONE HCL 0.4 MG/ML IJ SOLN
INTRAMUSCULAR | Status: AC
  Administered 2013-04-30: 2 mg
  Filled 2013-04-30: qty 5

## 2013-04-30 MED ORDER — NALOXONE HCL 1 MG/ML IJ SOLN: 2.0000 mg | Freq: Once | INTRAMUSCULAR | Status: DC

## 2013-04-30 NOTE — ED Notes (Signed)
Per report from [facility] pt was found by his sister to have a decreased LOC.  EMS found pt to have slurred speech and pinpoint pupils.  Narcan 1mg  IV administered.  Pt responded favorably to the medication and became alert and coherent.  Pt reports taking 3 Vicodin PO prior to the incident.

## 2013-04-30 NOTE — ED Provider Notes (Addendum)
History    CSN: [id number] Arrival date & time 04/30/13  40  First MD Initiated Contact with Patient 04/30/13 1108     Chief Complaint  Patient presents with  . Ingestion   (Consider location/radiation/quality/duration/timing/severity/associated sxs/prior Treatment) HPI Comments: 70 H. who has a history of diabetes, hypertension and a recent hip arthroplasty. This was a right-sided hip fracture treated with surgery, on hydrocodone at home. He presents to the hospital because of having altered mental status. His brother who he lives with states that he drove to his mother's house this morning, when the patient was found by his brother there he was found to have slurred speech and pinpoint pupils, he was given Narcan in the ambulance with almost immediate improvement in his mental status per the paramedics. His blood sugar was around 240. On arrival the patient is somnolent, difficult to arouse, has slight slurred speech, not a very good historian and because his mental status is depressed a level V caveat applied.  Patient is a 67 y.o. male presenting with Ingested Medication. The history is provided by the patient, the EMS personnel and a relative.  Ingestion   Past Medical History  Diagnosis Date  . Diabetes mellitus without complication   . Hypertension   . Hernia   . Back pain    Past Surgical History  Procedure Laterality Date  . No past surgeries    . Hip arthroplasty Right 02/05/2013    Procedure: ARTHROPLASTY BIPOLAR HIP;  Surgeon: [name], MD;  Location: [facility] ORS;  Service: Orthopedics;  Laterality: Right;   No family history on file. History  Substance Use Topics  . Smoking status: Former Smoker -- 0.25 packs/day for 30 years    Types: Cigarettes    Quit date: 01/31/2013  . Smokeless tobacco: Never Used  . Alcohol Use: No    Review of Systems  Unable to perform ROS: Mental status change    Allergies  Review of patient's allergies indicates no known  allergies.  Home Medications   Current Outpatient Rx  Name  Route  Sig  Dispense  Refill  . ferrous sulfate 325 (65 FE) MG tablet   Oral   Take 1 tablet (325 mg total) by mouth 3 (three) times daily after meals.   45 tablet   0   . glucose blood test strip      Use as instructed   100 each   12   . insulin aspart protamine- aspart (NOVOLOG 70/30) (70-30) 100 UNIT/ML injection   Subcutaneous   Inject 0.09 mLs (9 Units total) into the skin 2 (two) times daily with a meal.   10 mL   0   . Lancets (ONETOUCH ULTRASOFT) lancets      Use as instructed   100 each   12   . methocarbamol (ROBAXIN) 500 MG tablet   Oral   Take 1 tablet (500 mg total) by mouth every 6 (six) hours as needed.   40 tablet   1   . oxycodone (ROXICODONE) 30 MG immediate release tablet   Oral   Take 30 mg by mouth every 4 (four) hours as needed for pain.         . ramipril (ALTACE) 10 MG capsule   Oral   Take 1 capsule (10 mg total) by mouth daily.   30 capsule   0   . rivaroxaban (XARELTO) 10 MG TABS tablet   Oral   Take 1 tablet (10 mg total) by  mouth daily with breakfast.   18 tablet   0   . UNABLE TO FIND      glucometer   1 Device   0    BP 179/81  Pulse 80  Temp(Src) 99.1 F (37.3 C) (Oral)  Resp 19  SpO2 99% Physical Exam  Nursing note and vitals reviewed. Constitutional: He appears well-developed and well-nourished. No distress.  HENT:  Head: Normocephalic and atraumatic.  Mouth/Throat: Oropharynx is clear and moist. No oropharyngeal exudate.  Eyes: Conjunctivae and EOM are normal. Pupils are equal, round, and reactive to light. Right eye exhibits no discharge. Left eye exhibits no discharge. No scleral icterus.  Neck: Normal range of motion. Neck supple. No JVD present. No thyromegaly present.  Cardiovascular: Normal rate, regular rhythm, normal heart sounds and intact distal pulses.  Exam reveals no gallop and no friction rub.   No murmur heard. Pulmonary/Chest:  Effort normal and breath sounds normal. No respiratory distress. He has no wheezes. He has no rales.  Abdominal: Soft. Bowel sounds are normal. He exhibits no distension and no mass. There is no tenderness.  Musculoskeletal: Normal range of motion. He exhibits no edema and no tenderness.  Lymphadenopathy:    He has no cervical adenopathy.  Neurological: Coordination normal.  Somnolent but arousable to voice, follows commands, slight slurred speech, seems to be effort driven as when the patient is significantly awoken he is able to speak clearly and as he falls asleep his speech becomes more slurred. Normal strength in all 4 extremities, can straight leg raise bilaterally without difficulty, normal grips, follows commands, oriented to his birth date, location and events.  Skin: Skin is warm and dry. No rash noted. No erythema.  Psychiatric: He has a normal mood and affect. His behavior is normal.    ED Course  Procedures (including critical care time) Labs Reviewed  COMPREHENSIVE METABOLIC PANEL - Abnormal; Notable for the following:    Sodium 134 (*)    Chloride 93 (*)    Glucose, Bld 272 (*)    Total Protein 8.5 (*)    AST 45 (*)    Alkaline Phosphatase 132 (*)    Total Bilirubin 0.2 (*)    GFR calc non Af Amer 64 (*)    GFR calc Af Amer 74 (*)    All other components within normal limits  SALICYLATE LEVEL - Abnormal; Notable for the following:    Salicylate Lvl 123456 (*)    All other components within normal limits  CBC  ETHANOL  ACETAMINOPHEN LEVEL  TROPONIN I  URINE RAPID DRUG SCREEN (HOSP PERFORMED)  URINALYSIS, ROUTINE W REFLEX MICROSCOPIC   Ct Head Wo Contrast  04/30/2013   *RADIOLOGY REPORT*  Clinical Data: Decreased level of consciousness  CT HEAD WITHOUT CONTRAST  Technique:  Contiguous axial images were obtained from the base of the skull through the vertex without contrast.  Comparison: 11/02/2008  Findings: Calvarium is intact.  There is mild age related atrophy. No  abnormal attenuation to suggest hemorrhage, infarct, or mass. No hydrocephalus.  Minimal deep white matter low attenuation. Stable mild inflammatory change in the left maxillary sinus.  IMPRESSION: Mild stable age related involutional change.  No acute findings.   Original Report Authenticated By: Skipper Cliche, M.D.   1. Overdose of analgesic, initial encounter     MDM  Initially the patient had endorsed using approximately 3 pain pills, I asked him if he had taken 6 and he agreed to me, it is a possibility that  the patient had overdosed on his pain medications this morning, we are unsure of the exact overdose or if there was an overdose, he has been given Narcan again and has exhibited frequent yawning, appears mildly agitated moving all extremities and myoclonic type movements, still states that he took between 3 and 6 pain pills but changes the story from time to time, the brother who has now arrived states that this morning when he woke up his brother was similar to this, he was able to drive himself to his mother's house.  The patient is now awake, alert, ambulate in the hallways without difficulty and asking to be discharged. He endorses using too much Percocet this morning but states that it was because his back was hurting due to working overtime yesterday. He denies any suicidality, he has a bright affect, states that he has to leave to take care of some financial problems with his house but will come back for reevaluation later. I do not think is a high suicide risk, he appears stable but he will leave [facility] as I think he needs further evaluation and observation due to the acetaminophen overdose. I've explained this to him, he seems to have a clear mental status and is on medical decision-making capacity and has decided he wants to go at this time.  ED ECG REPORT  I personally interpreted this EKG   Date: 04/30/2013   Rate: 80  Rhythm: normal sinus rhythm  QRS Axis: left   Intervals: normal  ST/T Wave abnormalities: normal  Conduction Disutrbances:none  Narrative Interpretation:   Old EKG Reviewed: Compared with 02/02/2013, no significant changes   Johnna Acosta, MD 04/30/13 1327  Johnna Acosta, MD 04/30/13 1349

## 2013-04-30 NOTE — ED Notes (Signed)
Pt is now alert and oriented x 4.  No distress noted.  Resp symmetrical and unlabored.  Family at the bedside.  Pt removed his IV and states "I am ready to go home".  When asked about what happed earlier today he states that he has been working a lot to avoid foreclosure of his home.  He states that he took 3 Vicodin earlier this am due to the pain.  Pt is reportedly at his normal per his daughter and sister at the bedside.

## 2013-05-29 ENCOUNTER — Encounter (HOSPITAL_COMMUNITY): Payer: Self-pay | Admitting: Emergency Medicine

## 2013-05-29 ENCOUNTER — Emergency Department (HOSPITAL_COMMUNITY): Payer: PRIVATE HEALTH INSURANCE

## 2013-05-29 ENCOUNTER — Inpatient Hospital Stay (HOSPITAL_COMMUNITY)
Admission: EM | Admit: 2013-05-29 | Discharge: 2013-05-31 | DRG: 638 | Disposition: A | Discharge status: Home Health | Payer: PRIVATE HEALTH INSURANCE | Attending: Internal Medicine | Admitting: Internal Medicine

## 2013-05-29 DIAGNOSIS — E1110 Type 2 diabetes mellitus with ketoacidosis without coma: Secondary | ICD-10-CM

## 2013-05-29 DIAGNOSIS — Z96649 Presence of unspecified artificial hip joint: Secondary | ICD-10-CM

## 2013-05-29 DIAGNOSIS — R4182 Altered mental status, unspecified: Secondary | ICD-10-CM | POA: Diagnosis present

## 2013-05-29 DIAGNOSIS — Z833 Family history of diabetes mellitus: Secondary | ICD-10-CM

## 2013-05-29 DIAGNOSIS — N182 Chronic kidney disease, stage 2 (mild): Secondary | ICD-10-CM | POA: Diagnosis present

## 2013-05-29 DIAGNOSIS — D638 Anemia in other chronic diseases classified elsewhere: Secondary | ICD-10-CM

## 2013-05-29 DIAGNOSIS — Z9119 Patient's noncompliance with other medical treatment and regimen: Secondary | ICD-10-CM

## 2013-05-29 DIAGNOSIS — E1310 Other specified diabetes mellitus with ketoacidosis without coma: Principal | ICD-10-CM | POA: Diagnosis present

## 2013-05-29 DIAGNOSIS — R739 Hyperglycemia, unspecified: Secondary | ICD-10-CM

## 2013-05-29 DIAGNOSIS — N179 Acute kidney failure, unspecified: Secondary | ICD-10-CM | POA: Diagnosis present

## 2013-05-29 DIAGNOSIS — Z91199 Patient's noncompliance with other medical treatment and regimen due to unspecified reason: Secondary | ICD-10-CM

## 2013-05-29 DIAGNOSIS — Z87891 Personal history of nicotine dependence: Secondary | ICD-10-CM

## 2013-05-29 DIAGNOSIS — G8929 Other chronic pain: Secondary | ICD-10-CM | POA: Diagnosis present

## 2013-05-29 DIAGNOSIS — E871 Hypo-osmolality and hyponatremia: Secondary | ICD-10-CM | POA: Diagnosis present

## 2013-05-29 DIAGNOSIS — M549 Dorsalgia, unspecified: Secondary | ICD-10-CM | POA: Diagnosis present

## 2013-05-29 DIAGNOSIS — I129 Hypertensive chronic kidney disease with stage 1 through stage 4 chronic kidney disease, or unspecified chronic kidney disease: Secondary | ICD-10-CM | POA: Diagnosis present

## 2013-05-29 DIAGNOSIS — E119 Type 2 diabetes mellitus without complications: Secondary | ICD-10-CM

## 2013-05-29 LAB — DIFFERENTIAL
Band Neutrophils: 1 % (ref 0–10)
Basophils Absolute: 0 10*3/uL (ref 0.0–0.1)
Basophils Relative: 0 % (ref 0–1)
Blasts: 0 %
Eosinophils Absolute: 0 10*3/uL (ref 0.0–0.7)
Eosinophils Relative: 0 % (ref 0–5)
Lymphocytes Relative: 8 % — ABNORMAL LOW (ref 12–46)
Lymphs Abs: 1 10*3/uL (ref 0.7–4.0)
Metamyelocytes Relative: 0 %
Monocytes Absolute: 0.8 10*3/uL (ref 0.1–1.0)
Monocytes Relative: 6 % (ref 3–12)
Myelocytes: 0 %
Neutro Abs: 11.3 10*3/uL — ABNORMAL HIGH (ref 1.7–7.7)
Neutrophils Relative %: 85 % — ABNORMAL HIGH (ref 43–77)
Promyelocytes Absolute: 0 %
nRBC: 0 /100 WBC

## 2013-05-29 LAB — BLOOD GAS, VENOUS
Acid-base deficit: 5 mmol/L — ABNORMAL HIGH (ref 0.0–2.0)
Bicarbonate: 19.5 mEq/L — ABNORMAL LOW (ref 20.0–24.0)
Drawn by: 362341
O2 Saturation: 89.5 %
Patient temperature: 98.6
TCO2: 18.1 mmol/L (ref 0–100)
pCO2, Ven: 36.4 mmHg — ABNORMAL LOW (ref 45.0–50.0)
pH, Ven: 7.349 — ABNORMAL HIGH (ref 7.250–7.300)
pO2, Ven: 63.1 mmHg — ABNORMAL HIGH (ref 30.0–45.0)

## 2013-05-29 LAB — COMPREHENSIVE METABOLIC PANEL
ALT: 11 U/L (ref 0–53)
AST: 19 U/L (ref 0–37)
Albumin: 3.5 g/dL (ref 3.5–5.2)
Alkaline Phosphatase: 129 U/L — ABNORMAL HIGH (ref 39–117)
BUN: 63 mg/dL — ABNORMAL HIGH (ref 6–23)
CO2: 17 mEq/L — ABNORMAL LOW (ref 19–32)
Calcium: 8.9 mg/dL (ref 8.4–10.5)
Chloride: 87 mEq/L — ABNORMAL LOW (ref 96–112)
Creatinine, Ser: 2.43 mg/dL — ABNORMAL HIGH (ref 0.50–1.35)
GFR calc Af Amer: 30 mL/min — ABNORMAL LOW (ref >90)
GFR calc non Af Amer: 26 mL/min — ABNORMAL LOW (ref >90)
Glucose, Bld: 1082 mg/dL (ref 70–99)
Potassium: 4.7 mEq/L (ref 3.5–5.1)
Sodium: 130 mEq/L — ABNORMAL LOW (ref 135–145)
Total Bilirubin: 0.3 mg/dL (ref 0.3–1.2)
Total Protein: 7 g/dL (ref 6.0–8.3)

## 2013-05-29 LAB — CBC
HCT: 34.9 % — ABNORMAL LOW (ref 39.0–52.0)
Hemoglobin: 11.6 g/dL — ABNORMAL LOW (ref 13.0–17.0)
MCH: 29.1 pg (ref 26.0–34.0)
MCHC: 33.2 g/dL (ref 30.0–36.0)
MCV: 87.7 fL (ref 78.0–100.0)
Platelets: 214 10*3/uL (ref 150–400)
RBC: 3.98 MIL/uL — ABNORMAL LOW (ref 4.22–5.81)
WBC: 13.1 10*3/uL — ABNORMAL HIGH (ref 4.0–10.5)

## 2013-05-29 LAB — URINALYSIS, ROUTINE W REFLEX MICROSCOPIC
Bilirubin Urine: NEGATIVE
Glucose, UA: >1000 mg/dL — AB
Ketones, ur: 15 mg/dL — AB
Leukocytes, UA: NEGATIVE
Nitrite: NEGATIVE
Protein, ur: NEGATIVE mg/dL
Specific Gravity, Urine: 1.031 — ABNORMAL HIGH (ref 1.005–1.030)
Urobilinogen, UA: 0.2 mg/dL (ref 0.0–1.0)
pH: 5 (ref 5.0–8.0)

## 2013-05-29 LAB — RAPID URINE DRUG SCREEN, HOSP PERFORMED
Amphetamines: NOT DETECTED
Barbiturates: NOT DETECTED
Benzodiazepines: NOT DETECTED
Cocaine: NOT DETECTED
Opiates: POSITIVE — AB
Tetrahydrocannabinol: NOT DETECTED

## 2013-05-29 LAB — URINE MICROSCOPIC-ADD ON: Urine-Other: NONE SEEN

## 2013-05-29 MED ORDER — ZOLPIDEM TARTRATE 5 MG PO TABS: 5.0000 mg | ORAL_TABLET | Freq: Every evening | ORAL | Status: DC | PRN

## 2013-05-29 MED ORDER — DEXTROSE 50 % IV SOLN: 25.0000 mL | INTRAVENOUS | Status: DC | PRN

## 2013-05-29 MED ORDER — LEVOFLOXACIN IN D5W 500 MG/100ML IV SOLN
500.0000 mg | INTRAVENOUS | Status: DC
  Filled 2013-05-29: qty 100

## 2013-05-29 MED ORDER — ONDANSETRON HCL 4 MG/2ML IJ SOLN
4.0000 mg | Freq: Four times a day (QID) | INTRAMUSCULAR | Status: DC | PRN
  Administered 2013-05-30: 4 mg via INTRAVENOUS
  Filled 2013-05-29: qty 2

## 2013-05-29 MED ORDER — SODIUM CHLORIDE 0.9 % IV SOLN
1000.0000 mL | Freq: Once | INTRAVENOUS | Status: AC
  Administered 2013-05-29: 1000 mL via INTRAVENOUS

## 2013-05-29 MED ORDER — RIVAROXABAN 10 MG PO TABS: 10.0000 mg | ORAL_TABLET | Freq: Every day | ORAL | Status: DC

## 2013-05-29 MED ORDER — SODIUM CHLORIDE 0.9 % IV SOLN
INTRAVENOUS | Status: DC
  Administered 2013-05-29: 5.4 [IU]/h via INTRAVENOUS
  Filled 2013-05-29: qty 1

## 2013-05-29 MED ORDER — SODIUM CHLORIDE 0.9 % IV BOLUS (SEPSIS)
1000.0000 mL | Freq: Once | INTRAVENOUS | Status: AC
  Administered 2013-05-29: 1000 mL via INTRAVENOUS

## 2013-05-29 MED ORDER — SODIUM CHLORIDE 0.9 % IV SOLN
1000.0000 mL | INTRAVENOUS | Status: DC
  Administered 2013-05-30: 1000 mL via INTRAVENOUS

## 2013-05-29 MED ORDER — DEXTROSE-NACL 5-0.45 % IV SOLN
INTRAVENOUS | Status: DC
  Administered 2013-05-30: 05:00:00 via INTRAVENOUS

## 2013-05-29 MED ORDER — POTASSIUM CHLORIDE 10 MEQ/100ML IV SOLN
10.0000 meq | INTRAVENOUS | Status: AC
  Administered 2013-05-29 – 2013-05-30 (×2): 10 meq via INTRAVENOUS
  Filled 2013-05-29: qty 200

## 2013-05-29 MED ORDER — SODIUM CHLORIDE 0.9 % IV SOLN
INTRAVENOUS | Status: DC
  Administered 2013-05-29: 23:00:00 via INTRAVENOUS

## 2013-05-29 MED ORDER — SODIUM CHLORIDE 0.9 % IV SOLN
INTRAVENOUS | Status: AC
  Administered 2013-05-29: 16.2 [IU]/h via INTRAVENOUS
  Administered 2013-05-30: 10.2 [IU]/h via INTRAVENOUS
  Filled 2013-05-29 (×2): qty 1

## 2013-05-29 MED ORDER — ONDANSETRON HCL 4 MG PO TABS
4.0000 mg | ORAL_TABLET | Freq: Four times a day (QID) | ORAL | Status: DC | PRN
  Administered 2013-05-30: 4 mg via ORAL
  Filled 2013-05-29: qty 1

## 2013-05-29 MED ORDER — INSULIN ASPART 100 UNIT/ML ~~LOC~~ SOLN
10.0000 [IU] | Freq: Once | SUBCUTANEOUS | Status: AC
  Administered 2013-05-29: 10 [IU] via INTRAVENOUS
  Filled 2013-05-29: qty 1

## 2013-05-29 MED ORDER — LEVOFLOXACIN 500 MG PO TABS
500.0000 mg | ORAL_TABLET | ORAL | Status: DC
  Filled 2013-05-29: qty 1

## 2013-05-29 MED ORDER — ACETAMINOPHEN 650 MG RE SUPP: 650.0000 mg | Freq: Four times a day (QID) | RECTAL | Status: DC | PRN

## 2013-05-29 MED ORDER — HYDROCODONE-ACETAMINOPHEN 5-325 MG PO TABS
1.0000 | ORAL_TABLET | ORAL | Status: DC | PRN
  Administered 2013-05-30 – 2013-05-31 (×3): 1 via ORAL
  Filled 2013-05-29 (×3): qty 1

## 2013-05-29 MED ORDER — ACETAMINOPHEN 325 MG PO TABS: 650.0000 mg | ORAL_TABLET | Freq: Four times a day (QID) | ORAL | Status: DC | PRN

## 2013-05-29 NOTE — ED Notes (Signed)
XR at bedside

## 2013-05-29 NOTE — H&P (Addendum)
[facility]                                                                                    Patient Demographics  [name] [name], is a 67 y.o. male  CSN: [id number]  MRN: [id number]  DOB - 1945-10-24  Admit Date - 05/29/2013  Outpatient Primary MD for the patient is No primary provider on file.   With History of -  Past Medical History  Diagnosis Date  . Diabetes mellitus without complication   . Hypertension   . Hernia   . Back pain       Past Surgical History  Procedure Laterality Date  . No past surgeries    . Hip arthroplasty Right 02/05/2013    Procedure: ARTHROPLASTY BIPOLAR HIP;  Surgeon: [name], MD;  Location: [facility];  Service: Orthopedics;  Laterality: Right;    in for   Chief Complaint  Patient presents with  . Hyperglycemia     HPI  [name] [name]  is a 67 y.o. male,  With past medical history significant for diabetes mellitus who was brought to the emergency room because of elevated blood sugar. The patient reports that he did not have any insulin for the last 1 or 2 days (it ran out). His brother gave him 16 units of regular insulin at home prior to presentation however his blood sugar was 3 reading high. Patient denies abdominal pain, nausea, or vomiting. Patient denies any chest pains shortness of breath cough fever or chills. Patient is oriented only to self and is pleasantly confused    Review of Systems    In addition to the HPI above,  No Fever-chills, No Headache, No changes with Vision or hearing, No problems swallowing food or Liquids, No Chest pain, Cough or Shortness of Breath, No Abdominal pain, No Nausea or Vommitting, Bowel movements are regular, No Blood in stool or Urine, No dysuria, No new skin rashes or bruises, No new joints pains-aches,  No new weakness, tingling, numbness in any extremity, No recent weight gain or loss, No polyuria, polydypsia or polyphagia, No significant Mental Stressors.  A full 10  point Review of Systems was done, except as stated above, all other Review of Systems were negative.   Social History History  Substance Use Topics  . Smoking status: Former Smoker -- 0.25 packs/day for 30 years    Types: Cigarettes    Quit date: 01/31/2013  . Smokeless tobacco: Never Used  . Alcohol Use: No    Family History Significant for diabetes  Prior to Admission medications   Medication Sig Start Date End Date Taking? Authorizing Provider  ferrous sulfate 325 (65 FE) MG tablet Take 1 tablet (325 mg total) by mouth 3 (three) times daily after meals. 02/07/13   Amber Renelda Loma, PA-C  insulin aspart protamine- aspart (NOVOLOG 70/30) (70-30) 100 UNIT/ML injection Inject 0.09 mLs (9 Units total) into the skin 2 (two) times daily with a meal. 02/09/13   Orson Eva, MD  methocarbamol (ROBAXIN) 500 MG tablet Take 1 tablet (500 mg total) by mouth every 6 (six) hours as needed. 02/07/13   Amber Renelda Loma, PA-C  oxycodone (  ROXICODONE) 30 MG immediate release tablet Take 30 mg by mouth every 4 (four) hours as needed for pain.    Historical Provider, MD  ramipril (ALTACE) 10 MG capsule Take 1 capsule (10 mg total) by mouth daily. 02/09/13   Orson Eva, MD  rivaroxaban (XARELTO) 10 MG TABS tablet Take 1 tablet (10 mg total) by mouth daily with breakfast. 02/07/13   Amber Renelda Loma, PA-C    No Known Allergies  Physical Exam  Vitals  Blood pressure 107/52, pulse 77, temperature 99.9 F (37.7 C), temperature source Oral, resp. rate 19, SpO2 98.00%.   1. General elderly African American male lying in bed in no acute distress  2. flat affect and insight, Not Suicidal or Homicidal, pleasantly confused, oriented x1.  3. No F.N deficits, ALL C.Nerves Intact, Strength 5/5 all 4 extremities, Sensation intact all 4 extremities, Plantars down going.  4. Ears and Eyes appear Normal, Conjunctivae clear, PERRLA. Dry Oral Mucosa.  5. Supple Neck, No JVD, No cervical  lymphadenopathy appriciated, No Carotid Bruits.  6. Symmetrical Chest wall movement, Good air movement bilaterally, CTAB.  7. RRR, No Gallops, Rubs or Murmurs, No Parasternal Heave.  8. Positive Bowel Sounds, Abdomen Soft, Non tender, No organomegaly appriciated,No rebound -guarding or rigidity.  9.  No Cyanosis, Normal Skin Turgor, No Skin Rash or Bruise.  10. Good muscle tone,  joints appear normal , no effusions, Normal ROM.  11. No Palpable Lymph Nodes in Neck or Axillae    Data Review  CBC  Recent Labs Lab 05/29/13 1959  WBC 13.1*  HGB 11.6*  HCT 34.9*  PLT 214  MCV 87.7  MCH 29.1  MCHC 33.2  LYMPHSABS 1.0  MONOABS 0.8  EOSABS 0.0  BASOSABS 0.0   ------------------------------------------------------------------------------------------------------------------  Chemistries   Recent Labs Lab 05/29/13 1910  NA 130*  K 4.7  CL 87*  CO2 17*  GLUCOSE 1082*  BUN 63*  CREATININE 2.43*  CALCIUM 8.9  AST 19  ALT 11  ALKPHOS 129*  BILITOT 0.3   ------------------------------------------------------------------------------------------------------------------   ---------------------------------------------------------------------------------------------------------------  Urinalysis    Component Value Date/Time   COLORURINE YELLOW 05/29/2013 1857   APPEARANCEUR CLEAR 05/29/2013 1857   LABSPEC 1.031* 05/29/2013 1857   PHURINE 5.0 05/29/2013 1857   GLUCOSEU >1000* 05/29/2013 1857   HGBUR TRACE* 05/29/2013 1857   BILIRUBINUR NEGATIVE 05/29/2013 1857   KETONESUR 15* 05/29/2013 1857   PROTEINUR NEGATIVE 05/29/2013 1857   UROBILINOGEN 0.2 05/29/2013 1857   NITRITE NEGATIVE 05/29/2013 1857   LEUKOCYTESUR NEGATIVE 05/29/2013 1857    ----------------------------------------------------------------------------------------------------------------   Assessment & Plan  1. diabetic ketoacidosis : Noncompliant with medications, continue IV fluids normal saline and IV  insulin drip per protocol  2. acute on chronic renal failure: Continue with IV fluids. Hold Altace.   3. Leukocytosis: No source yet, chest x-rays pending, start IV Levaquin, blood cultures taken.  4. Hyponatremia; secondary to 1  5. Altered mental status; secondary to 1  Addendum: Chest x-ray showing bilateral basal pneumonia , patient to continue on Levaquin       DVT Prophylaxis:  continue with Xarelto  AM Labs Ordered, also please review Full Orders  Family Communication: Admission, patients condition and plan of care including tests being ordered have been discussed with the patient and sister who indicate understanding and agree with the plan and Code Status.  Code Status full  Disposition Plan: Home with home health  Time spent in minutes : 45 minutes  Condition GUARDED

## 2013-05-29 NOTE — ED Notes (Signed)
Bed: [id number] Expected date:  Expected time:  Means of arrival: Ambulance Comments: EMS hyperglycemia

## 2013-05-29 NOTE — ED Notes (Signed)
POCT CBG - "HIGH"

## 2013-05-29 NOTE — ED Notes (Signed)
Patient brought to ED because family took blood sugar reading and it read high.  EMS checked it and it also read high.  Family gave him 16 units novolog about an hour and a half ago.  No other complaints.  Family reports some altered level of conciousness as well.  He has been painting a basement with enamel paint with a hardener added without adequate ventilation also.

## 2013-05-29 NOTE — ED Provider Notes (Signed)
CSN: [id number]     Arrival date & time 05/29/13  1827 History     First MD Initiated Contact with Patient 05/29/13 1831     Chief Complaint  Patient presents with  . Hyperglycemia   (Consider location/radiation/quality/duration/timing/severity/associated sxs/prior Treatment) HPI  This is a 67 year old male with history of diabetes, hypertension and recent hip arthroplasty presents with hyperglycemia. Per the patient's brother, he noted that the patient's blood sugars today were too high to calculate. The patient states that he ran out of his insulin this morning but up to this time he has been taking his insulin. His brother gave him 16 units prior to arrival. Brother reports that the patient appears somewhat altered.  The patient is oriented to himself only. He denies any headache, chest pain, shortness of breath, abdominal pain, focal weakness or numbness. He denies any recent fevers. He endorses compliance with his insulin. Past Medical History  Diagnosis Date  . Diabetes mellitus without complication   . Hypertension   . Hernia   . Back pain    Past Surgical History  Procedure Laterality Date  . No past surgeries    . Hip arthroplasty Right 02/05/2013    Procedure: ARTHROPLASTY BIPOLAR HIP;  Surgeon: [name], MD;  Location: [facility];  Service: Orthopedics;  Laterality: Right;   No family history on file. History  Substance Use Topics  . Smoking status: Former Smoker -- 0.25 packs/day for 30 years    Types: Cigarettes    Quit date: 01/31/2013  . Smokeless tobacco: Never Used  . Alcohol Use: No    Review of Systems  Constitutional: Negative.  Negative for fever.  Eyes: Negative for visual disturbance.  Respiratory: Negative.  Negative for chest tightness and shortness of breath.   Cardiovascular: Negative.  Negative for chest pain.  Gastrointestinal: Negative.  Negative for abdominal pain.  Genitourinary: Negative.  Negative for dysuria.  Musculoskeletal: Negative  for back pain.  Skin: Negative for rash.  Neurological: Negative for headaches.  Psychiatric/Behavioral: Positive for confusion.  All other systems reviewed and are negative.    Allergies  Review of patient's allergies indicates no known allergies.  Home Medications   No current outpatient prescriptions on file. BP 108/46  Pulse 91  Temp(Src) 99.9 F (37.7 C) (Oral)  Resp 19  SpO2 96% Physical Exam  Nursing note and vitals reviewed. Constitutional: He is easily aroused. He has a sickly appearance. No distress.  HENT:  Head: Normocephalic and atraumatic.  Eyes: Pupils are equal, round, and reactive to light.  Neck: Neck supple.  Cardiovascular: Normal rate, regular rhythm and normal heart sounds.   No murmur heard. Pulmonary/Chest: Effort normal and breath sounds normal. No respiratory distress. He has no wheezes.  Abdominal: Soft. Bowel sounds are normal. There is no tenderness. There is no rebound.  Musculoskeletal: He exhibits no edema.  Lymphadenopathy:    He has no cervical adenopathy.  Neurological: He is easily aroused. He is disoriented.  Skin: Skin is warm and dry.  Psychiatric: He has a normal mood and affect.    ED Course   Date: 05/29/2013  Rate: 89  Rhythm: normal sinus rhythm  QRS Axis: right  Intervals: normal  ST/T Wave abnormalities: nonspecific T wave changes  Conduction Disutrbances:none  Narrative Interpretation: Sinus rhythm, no change from prior  Old EKG Reviewed: unchanged  Procedures (including critical care time)  Labs Reviewed  COMPREHENSIVE METABOLIC PANEL - Abnormal; Notable for the following:    Sodium 130 (*)  Chloride 87 (*)    CO2 17 (*)    Glucose, Bld 1082 (*)    BUN 63 (*)    Creatinine, Ser 2.43 (*)    Alkaline Phosphatase 129 (*)    GFR calc non Af Amer 26 (*)    GFR calc Af Amer 30 (*)    All other components within normal limits  URINALYSIS, ROUTINE W REFLEX MICROSCOPIC - Abnormal; Notable for the following:     Specific Gravity, Urine 1.031 (*)    Glucose, UA >1000 (*)    Hgb urine dipstick TRACE (*)    Ketones, ur 15 (*)    All other components within normal limits  URINE RAPID DRUG SCREEN (HOSP PERFORMED) - Abnormal; Notable for the following:    Opiates POSITIVE (*)    All other components within normal limits  CBC - Abnormal; Notable for the following:    WBC 13.1 (*)    RBC 3.98 (*)    Hemoglobin 11.6 (*)    HCT 34.9 (*)    All other components within normal limits  DIFFERENTIAL - Abnormal; Notable for the following:    Neutrophils Relative % 85 (*)    Lymphocytes Relative 8 (*)    Neutro Abs 11.3 (*)    All other components within normal limits  BLOOD GAS, VENOUS - Abnormal; Notable for the following:    pH, Ven 7.349 (*)    pCO2, Ven 36.4 (*)    pO2, Ven 63.1 (*)    Bicarbonate 19.5 (*)    Acid-base deficit 5.0 (*)    All other components within normal limits  CULTURE, BLOOD (ROUTINE X 2)  CULTURE, BLOOD (ROUTINE X 2)  MRSA PCR SCREENING  URINE MICROSCOPIC-ADD ON  CBC WITH DIFFERENTIAL  BASIC METABOLIC PANEL  BASIC METABOLIC PANEL  BASIC METABOLIC PANEL  BASIC METABOLIC PANEL  CBC  CBC   Dg Chest Portable 1 View  05/29/2013   *RADIOLOGY REPORT*  Clinical Data: hyperglycemia.  Hypertension.  PORTABLE CHEST - 1 VIEW  Comparison: 02/07/2013  Findings: The shallow inspiration.  Mild cardiac enlargement and pulmonary vascular prominence is probably normal for technique. There is increased density in the lung bases suggesting early infiltration or atelectasis.  No blunting of costophrenic angles. No pneumothorax.  Mediastinal contours appear intact.  Degenerative changes in the shoulders.  IMPRESSION: Shallow inspiration likely accounts for prominence of the heart size and pulmonary vessels.  Suggestion of early infiltration or atelectasis in the lung bases.   Original Report Authenticated By: Lucienne Capers, M.D.   1. DKA, type 2   2. Hyperglycemia   3. Diabetic ketoacidosis    4. Anemia of chronic disease   5. CKD (chronic kidney disease) stage 2, GFR 60-89 ml/min   6. Altered mental status     MDM  This is a 66 year old male who presents with hyperglycemia. He is alert but disoriented on my initial exam. He is nontoxic-appearing his vital signs are within normal limits. Lab work shows evidence of hyperglycemia and ketosis. It is not acidotic however. Patient was given 2 L of normal saline and a 10 unit insulin bolus. He was placed on an insulin drip. I suspect his hyperglycemia is due to noncompliance with insulin. However, patient was screened for infection. Urinalysis is reassuring. Chest x-ray shows possible infiltrate versus atelectasis. However, the patient has been afebrile.  The patient will be admitted to the hospitalist service for further management.  Merryl Hacker, MD 05/29/13 [phone]

## 2013-05-29 NOTE — ED Notes (Signed)
Pt's IV came out of hand - dripped blood down [place] into room and on curtain.  Called housekeeping to come and sanitize and replace curtain

## 2013-05-30 ENCOUNTER — Inpatient Hospital Stay (HOSPITAL_COMMUNITY): Payer: PRIVATE HEALTH INSURANCE

## 2013-05-30 DIAGNOSIS — E1310 Other specified diabetes mellitus with ketoacidosis without coma: Principal | ICD-10-CM

## 2013-05-30 LAB — GLUCOSE, CAPILLARY
Glucose-Capillary: 101 mg/dL — ABNORMAL HIGH (ref 70–99)
Glucose-Capillary: 110 mg/dL — ABNORMAL HIGH (ref 70–99)
Glucose-Capillary: 110 mg/dL — ABNORMAL HIGH (ref 70–99)
Glucose-Capillary: 122 mg/dL — ABNORMAL HIGH (ref 70–99)
Glucose-Capillary: 127 mg/dL — ABNORMAL HIGH (ref 70–99)
Glucose-Capillary: 135 mg/dL — ABNORMAL HIGH (ref 70–99)
Glucose-Capillary: 147 mg/dL — ABNORMAL HIGH (ref 70–99)
Glucose-Capillary: 173 mg/dL — ABNORMAL HIGH (ref 70–99)
Glucose-Capillary: 202 mg/dL — ABNORMAL HIGH (ref 70–99)
Glucose-Capillary: 258 mg/dL — ABNORMAL HIGH (ref 70–99)
Glucose-Capillary: 264 mg/dL — ABNORMAL HIGH (ref 70–99)
Glucose-Capillary: 292 mg/dL — ABNORMAL HIGH (ref 70–99)
Glucose-Capillary: 415 mg/dL — ABNORMAL HIGH (ref 70–99)
Glucose-Capillary: 500 mg/dL — ABNORMAL HIGH (ref 70–99)
Glucose-Capillary: 585 mg/dL (ref 70–99)
Glucose-Capillary: >600 mg/dL (ref 70–99)
Glucose-Capillary: >600 mg/dL (ref 70–99)
Glucose-Capillary: >600 mg/dL (ref 70–99)
Glucose-Capillary: >600 mg/dL (ref 70–99)

## 2013-05-30 LAB — CBC
HCT: 32.7 % — ABNORMAL LOW (ref 39.0–52.0)
HCT: 33.6 % — ABNORMAL LOW (ref 39.0–52.0)
Hemoglobin: 10.6 g/dL — ABNORMAL LOW (ref 13.0–17.0)
Hemoglobin: 11.1 g/dL — ABNORMAL LOW (ref 13.0–17.0)
MCH: 28.1 pg (ref 26.0–34.0)
MCH: 28.9 pg (ref 26.0–34.0)
MCHC: 32.4 g/dL (ref 30.0–36.0)
MCHC: 33 g/dL (ref 30.0–36.0)
MCV: 86.7 fL (ref 78.0–100.0)
MCV: 87.5 fL (ref 78.0–100.0)
Platelets: 195 10*3/uL (ref 150–400)
Platelets: 198 10*3/uL (ref 150–400)
RBC: 3.77 MIL/uL — ABNORMAL LOW (ref 4.22–5.81)
RBC: 3.84 MIL/uL — ABNORMAL LOW (ref 4.22–5.81)
RDW: 13.9 % (ref 11.5–15.5)
RDW: 13.9 % (ref 11.5–15.5)
WBC: 12.1 10*3/uL — ABNORMAL HIGH (ref 4.0–10.5)
WBC: 9.5 10*3/uL (ref 4.0–10.5)

## 2013-05-30 LAB — BASIC METABOLIC PANEL
BUN: 57 mg/dL — ABNORMAL HIGH (ref 6–23)
BUN: 59 mg/dL — ABNORMAL HIGH (ref 6–23)
BUN: 57 mg/dL — ABNORMAL HIGH (ref 6–23)
BUN: 56 mg/dL — ABNORMAL HIGH (ref 6–23)
BUN: 55 mg/dL — ABNORMAL HIGH (ref 6–23)
BUN: 54 mg/dL — ABNORMAL HIGH (ref 6–23)
CO2: 24 mEq/L (ref 19–32)
CO2: 24 mEq/L (ref 19–32)
CO2: 26 mEq/L (ref 19–32)
CO2: 27 mEq/L (ref 19–32)
CO2: 27 mEq/L (ref 19–32)
CO2: 26 mEq/L (ref 19–32)
Calcium: 8.8 mg/dL (ref 8.4–10.5)
Calcium: 9 mg/dL (ref 8.4–10.5)
Calcium: 8.9 mg/dL (ref 8.4–10.5)
Calcium: 9 mg/dL (ref 8.4–10.5)
Calcium: 9.1 mg/dL (ref 8.4–10.5)
Calcium: 9.2 mg/dL (ref 8.4–10.5)
Chloride: 100 mEq/L (ref 96–112)
Chloride: 104 mEq/L (ref 96–112)
Chloride: 105 mEq/L (ref 96–112)
Chloride: 110 mEq/L (ref 96–112)
Chloride: 109 mEq/L (ref 96–112)
Chloride: 108 mEq/L (ref 96–112)
Creatinine, Ser: 1.93 mg/dL — ABNORMAL HIGH (ref 0.50–1.35)
Creatinine, Ser: 2.08 mg/dL — ABNORMAL HIGH (ref 0.50–1.35)
Creatinine, Ser: 1.81 mg/dL — ABNORMAL HIGH (ref 0.50–1.35)
Creatinine, Ser: 1.66 mg/dL — ABNORMAL HIGH (ref 0.50–1.35)
Creatinine, Ser: 1.58 mg/dL — ABNORMAL HIGH (ref 0.50–1.35)
Creatinine, Ser: 1.53 mg/dL — ABNORMAL HIGH (ref 0.50–1.35)
GFR calc Af Amer: 36 mL/min — ABNORMAL LOW (ref >90)
GFR calc Af Amer: 39 mL/min — ABNORMAL LOW (ref >90)
GFR calc Af Amer: 43 mL/min — ABNORMAL LOW (ref >90)
GFR calc Af Amer: 47 mL/min — ABNORMAL LOW (ref >90)
GFR calc Af Amer: 50 mL/min — ABNORMAL LOW (ref >90)
GFR calc Af Amer: 52 mL/min — ABNORMAL LOW (ref >90)
GFR calc non Af Amer: 31 mL/min — ABNORMAL LOW (ref >90)
GFR calc non Af Amer: 34 mL/min — ABNORMAL LOW (ref >90)
GFR calc non Af Amer: 37 mL/min — ABNORMAL LOW (ref >90)
GFR calc non Af Amer: 41 mL/min — ABNORMAL LOW (ref >90)
GFR calc non Af Amer: 43 mL/min — ABNORMAL LOW (ref >90)
GFR calc non Af Amer: 45 mL/min — ABNORMAL LOW (ref >90)
Glucose, Bld: 526 mg/dL — ABNORMAL HIGH (ref 70–99)
Glucose, Bld: 653 mg/dL (ref 70–99)
Glucose, Bld: 372 mg/dL — ABNORMAL HIGH (ref 70–99)
Glucose, Bld: 174 mg/dL — ABNORMAL HIGH (ref 70–99)
Glucose, Bld: 112 mg/dL — ABNORMAL HIGH (ref 70–99)
Glucose, Bld: 113 mg/dL — ABNORMAL HIGH (ref 70–99)
Potassium: 3.7 mEq/L (ref 3.5–5.1)
Potassium: 4 mEq/L (ref 3.5–5.1)
Potassium: 3.7 mEq/L (ref 3.5–5.1)
Potassium: 3.7 mEq/L (ref 3.5–5.1)
Potassium: 4 mEq/L (ref 3.5–5.1)
Potassium: 4.2 mEq/L (ref 3.5–5.1)
Sodium: 137 mEq/L (ref 135–145)
Sodium: 139 mEq/L (ref 135–145)
Sodium: 139 mEq/L (ref 135–145)
Sodium: 145 mEq/L (ref 135–145)
Sodium: 144 mEq/L (ref 135–145)
Sodium: 144 mEq/L (ref 135–145)

## 2013-05-30 LAB — MRSA PCR SCREENING: MRSA by PCR: NEGATIVE

## 2013-05-30 LAB — HEMOGLOBIN A1C
Hgb A1c MFr Bld: 9.3 % — ABNORMAL HIGH (ref <5.7)
Mean Plasma Glucose: 220 mg/dL — ABNORMAL HIGH (ref <117)

## 2013-05-30 MED ORDER — LEVOFLOXACIN 250 MG PO TABS
250.0000 mg | ORAL_TABLET | ORAL | Status: DC
  Filled 2013-05-30: qty 1

## 2013-05-30 MED ORDER — ENOXAPARIN SODIUM 30 MG/0.3ML ~~LOC~~ SOLN: 30.0000 mg | SUBCUTANEOUS | Status: DC

## 2013-05-30 MED ORDER — LEVOFLOXACIN 500 MG PO TABS
500.0000 mg | ORAL_TABLET | Freq: Once | ORAL | Status: AC
  Administered 2013-05-30: 500 mg via ORAL
  Filled 2013-05-30: qty 1

## 2013-05-30 MED ORDER — ENOXAPARIN SODIUM 40 MG/0.4ML ~~LOC~~ SOLN
40.0000 mg | SUBCUTANEOUS | Status: DC
  Administered 2013-05-30 – 2013-05-31 (×2): 40 mg via SUBCUTANEOUS
  Filled 2013-05-30 (×2): qty 0.4

## 2013-05-30 MED ORDER — INSULIN GLARGINE 100 UNIT/ML ~~LOC~~ SOLN
15.0000 [IU] | Freq: Every day | SUBCUTANEOUS | Status: DC
  Administered 2013-05-30: 15 [IU] via SUBCUTANEOUS
  Filled 2013-05-30 (×2): qty 0.15

## 2013-05-30 MED ORDER — INSULIN ASPART 100 UNIT/ML ~~LOC~~ SOLN
0.0000 [IU] | Freq: Three times a day (TID) | SUBCUTANEOUS | Status: DC
  Administered 2013-05-30: 1 [IU] via SUBCUTANEOUS
  Administered 2013-05-30: 5 [IU] via SUBCUTANEOUS
  Administered 2013-05-31 (×2): 3 [IU] via SUBCUTANEOUS

## 2013-05-30 MED ORDER — SODIUM CHLORIDE 0.9 % IV SOLN
1000.0000 mL | INTRAVENOUS | Status: DC
  Administered 2013-05-31: 1000 mL via INTRAVENOUS

## 2013-05-30 NOTE — Progress Notes (Signed)
Pt drew up 5 units novolog insulin into 50 unit syringe and gave it to himself in right abdomen with minimal assist.

## 2013-05-30 NOTE — Progress Notes (Signed)
[facility] PROGRESS NOTE  Takeem Kucher [id number] DOB: December 30, 1945 DOA: 05/29/2013 PCP: No primary provider on file.  Assessment/Plan: 1. DKA -ran out of Insulin few days ago, very poor historian/poor insight -acidosis corrected -start lantus and stop insulin gtt in 2 hours -DM coordinator consult -switch to Insulin 70/30 tomorrow, reportedly takes this at home -no PCP, CM consult -check Hbaic  2. ARF on CKD -improved with hydration -hold ACE, keep in IVF today  3. Leukocytosis -likely reactive, no cough/congetsion -CXR unimpressive, will repeat today  4. HTN -BP stable, ACE on hold  DVT prophylaxis: Was started on Xarelto for DVT proph post op after Hip surgery in April, will Stop Xarelto since 3 months post op now  Code Status: Full Family Communication: none at bedside Disposition Plan: home tomorrow if stable  Consultants:  DM coordinator pending  Antibiotics:  levaquin 8/17  HPI/Subjective: Feels ok, hungry wants to eat, denies any cough, congetion  Objective: Filed Vitals:   05/30/13 0600  BP: 112/56  Pulse: 61  Temp:   Resp: 18    Intake/Output Summary (Last 24 hours) at 05/30/13 0755 Last data filed at 05/30/13 0700  Gross per 24 hour  Intake 1271.9 ml  Output    500 ml  Net  771.9 ml   Filed Weights   05/29/13 2250 05/30/13 0400  Weight: 62.6 kg (138 lb 0.1 oz) 63.1 kg (139 lb 1.8 oz)    Exam:   General:  Aaox3, very poor insight  Cardiovascular: S1S2/RRR  Respiratory: CTAB  Abdomen: soft, Nt, BS present  Musculoskeletal: no edema c/c   Data Reviewed: Basic Metabolic Panel:  Recent Labs Lab 05/29/13 2345 05/30/13 0055 05/30/13 0240 05/30/13 0509 05/30/13 0655  NA 137 139 139 145 144  K 3.7 4.0 3.7 3.7 4.0  CL 100 104 105 110 109  CO2 24 24 26 27 27   GLUCOSE 653* 526* 372* 174* 112*  BUN 59* 57* 57* 56* 55*  CREATININE 2.08* 1.93* 1.81* 1.66* 1.58*  CALCIUM 9.0 8.8 8.9 9.0 9.1   Liver Function  Tests:  Recent Labs Lab 05/29/13 1910  AST 19  ALT 11  ALKPHOS 129*  BILITOT 0.3  PROT 7.0  ALBUMIN 3.5   No results found for this basename: LIPASE, AMYLASE,  in the last 168 hours No results found for this basename: AMMONIA,  in the last 168 hours CBC:  Recent Labs Lab 05/29/13 1959 05/29/13 2345 05/30/13 0509  WBC 13.1* 9.5 12.1*  NEUTROABS 11.3*  --   --   HGB 11.6* 11.1* 10.6*  HCT 34.9* 33.6* 32.7*  MCV 87.7 87.5 86.7  PLT 214 195 198   Cardiac Enzymes: No results found for this basename: CKTOTAL, CKMB, CKMBINDEX, TROPONINI,  in the last 168 hours BNP (last 3 results) No results found for this basename: PROBNP,  in the last 8760 hours CBG:  Recent Labs Lab 05/29/13 2357 05/30/13 0103 05/30/13 0209 05/30/13 0313 05/30/13 0351  GLUCAP 585* 500* 415* 292* 264*    Recent Results (from the past 240 hour(s))  MRSA PCR SCREENING     Status: None   Collection Time    05/29/13 11:25 PM      Result Value Range Status   MRSA by PCR NEGATIVE  NEGATIVE Final   Comment:            The GeneXpert MRSA Assay (FDA     approved for NASAL specimens     only), is one component of a  comprehensive MRSA colonization     surveillance program. It is not     intended to diagnose MRSA     infection nor to guide or     monitor treatment for     MRSA infections.     Performed at [facility]     Studies: Dg Chest Portable 1 View  05/29/2013   *RADIOLOGY REPORT*  Clinical Data: hyperglycemia.  Hypertension.  PORTABLE CHEST - 1 VIEW  Comparison: 02/07/2013  Findings: The shallow inspiration.  Mild cardiac enlargement and pulmonary vascular prominence is probably normal for technique. There is increased density in the lung bases suggesting early infiltration or atelectasis.  No blunting of costophrenic angles. No pneumothorax.  Mediastinal contours appear intact.  Degenerative changes in the shoulders.  IMPRESSION: Shallow inspiration likely accounts for prominence of  the heart size and pulmonary vessels.  Suggestion of early infiltration or atelectasis in the lung bases.   Original Report Authenticated By: Lucienne Capers, M.D.    Scheduled Meds: . enoxaparin (LOVENOX) injection  30 mg Subcutaneous Q24H  . insulin glargine  15 Units Subcutaneous Daily  . levofloxacin  250 mg Oral Q24H   Continuous Infusions: . sodium chloride Stopped (05/29/13 2259)  . sodium chloride Stopped (05/30/13 0500)  . dextrose 5 % and 0.45% NaCl 75 mL/hr at 05/30/13 0500  . insulin (NOVOLIN-R) infusion 1.5 Units/hr (05/30/13 0700)    Principal Problem:   Diabetic ketoacidosis    Time spent: 49min    Naisha Wisdom  [facility] Pager [phone]. If 7PM-7AM, please contact night-coverage at [email or web address], password [facility] 05/30/2013, 7:55 AM  LOS: 1 day

## 2013-05-30 NOTE — Progress Notes (Signed)
Inpatient Diabetes Program Recommendations  AACE/ADA: New Consensus Statement on Inpatient Glycemic Control (2013)  Target Ranges:  Prepandial:   less than 140 mg/dL      Peak postprandial:   less than 180 mg/dL (1-2 hours)      Critically ill patients:  140 - 180 mg/dL     **Admitted with DKA.  Glucose 1082 mg/dl on admission (CO2 17).  Given IVF and started on IV insulin drip.  Transitioned off IV insulin drip this morning at ~10am.  Patient was given 15 units of Lantus this morning around 9am.  Currently has orders for Lantus 15 units daily plus Novolog SSI.    **Noted MD may switch patient back to his home 70/30 insulin tomorrow morning.  If this is the case, please start 70/30 insulin- 10 units bid with meals (breakfast and supper) tomorrow morning (08/19) and d/c the ordered Lantus insulin.  Patient told me this morning that he takes 70/30 insulin, 10 units bid at home.  **Spoke with patient about the events leading up to his admission.  Patient told me he does not have any financial problems buying insulin.  Patient told me he was painting his house and opened all the windows to air the fumes out.  Patient told me he laid down to rest and thinks he may have "passed out" from the paint fumes.  Did not get up to take his 70/30 insulin.  Not sure how many doses of insulin patient missed.  Not sure how much of patient's account is true??  Have asked RNs caring for patient to watch patient draw up and administer insulin to make sure he is able to do so correctly.    **Patient needs a PCP.  Noted care management has been consulted for help with this.  Patient has Medicare coverage.  After my conversation with patient, I feel like we need to go ahead and schedule a new patient appointment for him with a physician in the community.  We could give the patient a list of MDs, however, I am not convinced patient will make the effort to make an appointment.  I think if we make the appointment he will go.   I just think patient needs help making the appointment.  Will talk with care management and see what we can do to assist patient.     Will follow. Wyn Quaker RN, MSN, CDE Diabetes Coordinator Inpatient Diabetes Program [phone]

## 2013-05-30 NOTE — Progress Notes (Signed)
Utilization review completed.  

## 2013-05-31 DIAGNOSIS — E119 Type 2 diabetes mellitus without complications: Secondary | ICD-10-CM

## 2013-05-31 LAB — CBC
HCT: 33.3 % — ABNORMAL LOW (ref 39.0–52.0)
Hemoglobin: 11 g/dL — ABNORMAL LOW (ref 13.0–17.0)
MCH: 29.1 pg (ref 26.0–34.0)
MCHC: 33 g/dL (ref 30.0–36.0)
MCV: 88.1 fL (ref 78.0–100.0)
Platelets: 166 10*3/uL (ref 150–400)
RBC: 3.78 MIL/uL — ABNORMAL LOW (ref 4.22–5.81)
RDW: 14.3 % (ref 11.5–15.5)
WBC: 7.5 10*3/uL (ref 4.0–10.5)

## 2013-05-31 LAB — BASIC METABOLIC PANEL
BUN: 33 mg/dL — ABNORMAL HIGH (ref 6–23)
CO2: 26 mEq/L (ref 19–32)
Calcium: 8.4 mg/dL (ref 8.4–10.5)
Chloride: 105 mEq/L (ref 96–112)
Creatinine, Ser: 1.01 mg/dL (ref 0.50–1.35)
GFR calc Af Amer: 87 mL/min — ABNORMAL LOW (ref >90)
GFR calc non Af Amer: 75 mL/min — ABNORMAL LOW (ref >90)
Glucose, Bld: 224 mg/dL — ABNORMAL HIGH (ref 70–99)
Potassium: 4.1 mEq/L (ref 3.5–5.1)
Sodium: 139 mEq/L (ref 135–145)

## 2013-05-31 LAB — GLUCOSE, CAPILLARY
Glucose-Capillary: 239 mg/dL — ABNORMAL HIGH (ref 70–99)
Glucose-Capillary: 246 mg/dL — ABNORMAL HIGH (ref 70–99)
Glucose-Capillary: 291 mg/dL — ABNORMAL HIGH (ref 70–99)

## 2013-05-31 MED ORDER — RAMIPRIL 10 MG PO CAPS
10.0000 mg | ORAL_CAPSULE | Freq: Every day | ORAL | Status: DC
  Administered 2013-05-31: 10 mg via ORAL
  Filled 2013-05-31: qty 1

## 2013-05-31 MED ORDER — INSULIN ASPART PROT & ASPART (70-30 MIX) 100 UNIT/ML ~~LOC~~ SUSP
10.0000 [IU] | Freq: Two times a day (BID) | SUBCUTANEOUS | Status: DC
  Administered 2013-05-31: 10 [IU] via SUBCUTANEOUS
  Filled 2013-05-31: qty 10

## 2013-05-31 MED ORDER — OXYCODONE HCL 30 MG PO TABS: 15.0000 mg | ORAL_TABLET | ORAL | Status: DC | PRN

## 2013-05-31 MED ORDER — INSULIN ASPART 100 UNIT/ML ~~LOC~~ SOLN
4.0000 [IU] | Freq: Once | SUBCUTANEOUS | Status: AC
  Administered 2013-05-31: 4 [IU] via SUBCUTANEOUS

## 2013-05-31 MED ORDER — INSULIN ASPART PROT & ASPART (70-30 MIX) 100 UNIT/ML ~~LOC~~ SUSP: 15.0000 [IU] | Freq: Two times a day (BID) | SUBCUTANEOUS | Status: DC

## 2013-05-31 NOTE — Discharge Summary (Signed)
Physician Discharge Summary  [name] [name] [id number] DOB: [birth year] DOA: 05/29/2013  PCP: No primary provider on file.  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 male, With past medical history significant for diabetes mellitus who was brought to the emergency room because of elevated blood sugar >600. The patient reports that he did not have any 70/30 insulin for the last 1 week (it ran out). His brother gave him 16 units of regular insulin at home prior to presentation however his blood sugar was reading high. Patient denied abdominal pain, nausea, or vomiting.    Hospital Course:  1. DKA -ran out of Insulin 70/30 few days to week ago and tried taking some old Novolog instead, very poor historian/poor insight and motivation. -On admission he was found to be in DKA with Glucose on Bmet of 1082 -DKA corrected with IV regular insulin and IVF.  -Received a dose of lantus yesterday during transition and then started lback on Insulin 70/30, after discussion with DM coordinator he was felt to require atleast 15units BID and this will need further titration as outpatient. -Seen by Case manager and DM coordinator -no PCP hence to [facility]U with [facility]  -Hbaic 9.3  2. 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 Leukocytosis  -likely reactive, afebrile -resolved  4. HTN  -BP stable, Ramipril  resumed at discharge  5. Chronic back pain -continued on half of home dose oxycodone, advised further  weaning down as outpatient  DVT prophylaxis: Was started on Xarelto for DVT prophylaxis post op after Hip surgery in April, Stopped Xarelto since 3 months post op now and no h/o VTE or Afib   Consultations:  DM coordinator  Discharge Exam: Filed Vitals:   05/31/13 0459  BP: 161/89  Pulse:   Temp: 98.7 F (37.1 C)  Resp: 18    General: AAOx3 Cardiovascular: S1s2/RRR Respiratory: CTAB  Discharge Instructions      Discharge Orders   Future Appointments Provider Department Dept Phone   06/08/2013 2:45 PM [facility] Covering Provider [place] [phone]   Future Orders Complete By Expires   Diet - low sodium heart healthy  As directed    Diet Carb Modified  As directed    Increase activity slowly  As directed        Medication List    STOP taking these medications       rivaroxaban 10 MG Tabs tablet  Commonly known as:  XARELTO      TAKE these medications       cholecalciferol 1000 UNITS tablet  Commonly known as:  VITAMIN D  Take 1,000 Units by mouth every morning.     ferrous sulfate 325 (65 FE) MG tablet  Take 1 tablet (325 mg total) by mouth 3 (three) times daily after meals.     insulin aspart  protamine- aspart (70-30) 100 UNIT/ML injection  Commonly known as:  NOVOLOG MIX 70/30  Inject 0.15 mL (15 Units total) into the skin 2 (two) times daily with a meal.     oxycodone 30 MG immediate release tablet  Commonly known as:  ROXICODONE  Take 0.5 tablets (15 mg total) by mouth every 4 (four) hours as needed for pain.     ramipril 10 MG capsule  Commonly known as:  ALTACE  Take 10 mg by mouth every morning.       No Known Allergies    The results of significant diagnostics from this hospitalization (including imaging, microbiology, ancillary and laboratory) are listed below for reference.    Significant  Diagnostic Studies: Dg Chest 2 View  05/30/2013   *RADIOLOGY REPORT*  Clinical Data: atelectasis  CHEST - 2 VIEW  Comparison: 05/29/2013  Findings: The heart size appears normal.  Linear opacity in the lung bases may represent scarring or atelectasis.  No airspace consolidation identified.  There are no pleural effusions identified. Within the mid to upper thoracic spine there is a mild compression deformity.  This is unchanged from 02/02/2013.  IMPRESSION:  1.  Scar versus atelectasis in the lung bases. 2.  Chronic mid to upper thoracic spine compression deformity.   Original Report Authenticated By: Kerby Moors, M.D.   Dg Chest Portable 1 View  05/29/2013   *RADIOLOGY REPORT*  Clinical Data: hyperglycemia.  Hypertension.  PORTABLE CHEST - 1 VIEW  Comparison: 02/07/2013  Findings: The shallow inspiration.  Mild cardiac enlargement and pulmonary vascular prominence is probably normal for technique. There is increased density in the lung bases suggesting early infiltration or atelectasis.  No blunting of costophrenic angles. No pneumothorax.  Mediastinal contours appear intact.  Degenerative changes in the shoulders.  IMPRESSION: Shallow inspiration likely accounts for prominence of the heart size and pulmonary vessels.  Suggestion of early infiltration or atelectasis in the lung bases.   Original Report Authenticated By: Lucienne Capers, M.D.    Microbiology: Recent Results (from the past 240 hour(s))  MRSA PCR SCREENING     Status: None   Collection Time    05/29/13 11:25 PM      Result Value Range Status   MRSA by PCR NEGATIVE  NEGATIVE Final   Comment:            The GeneXpert MRSA Assay (FDA     approved for NASAL specimens     only), is one component of a     comprehensive MRSA colonization     surveillance program. It is not     intended to diagnose MRSA     infection nor to guide or     monitor treatment for     MRSA infections.     Performed at [facility], BLOOD  (ROUTINE X 2)     Status: None   Collection Time    05/29/13 11:45 PM      Result Value Range Status   Specimen Description BLOOD RIGHT ARM   Final   Special Requests BOTTLES DRAWN AEROBIC AND ANAEROBIC  7ML   Final   Culture  Setup Time     Final   Value: 05/30/2013 09:03     Performed at [facility]   Culture     Final   Value:        BLOOD CULTURE RECEIVED NO GROWTH TO DATE CULTURE WILL BE HELD FOR 5 DAYS BEFORE ISSUING A FINAL NEGATIVE REPORT  Performed at [facility]   Report Status PENDING   Incomplete  CULTURE, BLOOD (ROUTINE X 2)     Status: None   Collection Time    05/29/13 11:50 PM      Result Value Range Status   Specimen Description BLOOD RIGHT HAND   Final   Special Requests BOTTLES DRAWN AEROBIC AND ANAEROBIC  9ML   Final   Culture  Setup Time     Final   Value: 05/30/2013 09:02     Performed at [facility]   Culture     Final   Value:        BLOOD CULTURE RECEIVED NO GROWTH TO DATE CULTURE WILL BE HELD FOR 5 DAYS BEFORE ISSUING A FINAL NEGATIVE REPORT     Performed at [facility]   Report Status PENDING   Incomplete     Labs: Basic Metabolic Panel:  Recent Labs Lab 05/30/13 0240 05/30/13 0509 05/30/13 0655 05/30/13 0910 05/31/13 0450  NA 139 145 144 144 139  K 3.7 3.7 4.0 4.2 4.1  CL 105 110 109 108 105  CO2 26 27 27 26 26   GLUCOSE 372* 174* 112* 113* 224*  BUN 57* 56* 55* 54* 33*  CREATININE 1.81* 1.66* 1.58* 1.53* 1.01  CALCIUM 8.9 9.0 9.1 9.2 8.4   Liver Function Tests:  Recent Labs Lab 05/29/13 1910  AST 19  ALT 11  ALKPHOS 129*  BILITOT 0.3  PROT 7.0  ALBUMIN 3.5   No results found for this basename: LIPASE, AMYLASE,  in the last 168 hours No results found for this basename: AMMONIA,  in the last 168 hours CBC:  Recent Labs Lab 05/29/13 1959 05/29/13 2345 05/30/13 0509 05/31/13 0450  WBC 13.1* 9.5 12.1* 7.5  NEUTROABS 11.3*  --   --   --   HGB 11.6* 11.1* 10.6* 11.0*  HCT 34.9* 33.6*  32.7* 33.3*  MCV 87.7 87.5 86.7 88.1  PLT 214 195 198 166   Cardiac Enzymes: No results found for this basename: CKTOTAL, CKMB, CKMBINDEX, TROPONINI,  in the last 168 hours BNP: BNP (last 3 results) No results found for this basename: PROBNP,  in the last 8760 hours CBG:  Recent Labs Lab 05/30/13 1229 05/30/13 1653 05/30/13 2124 05/31/13 0051 05/31/13 0743  GLUCAP 135* 258* 202* 291* 239*       Signed:  Captola Teschner  [facility] 05/31/2013, 11:06 AM

## 2013-05-31 NOTE — Care Management Note (Signed)
    Page 1 of 2   05/31/2013     11:53:02 AM   CARE MANAGEMENT NOTE 05/31/2013  Patient:  [name] [name],[name] [name]   Account Number:  [id number]  Date Initiated:  05/31/2013  Documentation initiated by:  Sherrin Daisy  Subjective/Objective Assessment:   Dx DKA    Has prescription coveage-Gets meds at [facility] pain managemnt MD monthly     Action/Plan:   CM spoke with patient. Patient is planning to return to his home in [place] where he and his brother live together. His brother will be caregiver. States daughter will offer support. No DME needs. Pt is ambulatory. Has glucometer.   Anticipated DC Date:  05/31/2013   Anticipated DC Plan:  [place] referral  Clinical Social Worker      DC Planning Services  CM consult      [facility] Choice  HOME HEALTH   Choice offered to / List presented to:             Status of service:  Completed, signed off Medicare Important Message given?  NA - LOS <3 / Initial given by admissions (If response is "NO", the following Medicare IM given date fields will be blank) Date Medicare IM given:   Date Additional Medicare IM given:    Discharge Disposition:    Per UR Regulation:    If discussed at Long Length of Stay Meetings, dates discussed:    Comments:  05/31/2013 Sherrin Daisy BSN RN CCM [phone] Pt has heath insurance and prescription coverage. He uses [facility] for prescriptions and has no problems getting meds. Sees pain managemnt MD in [place]-Dr Arvind. Does not have PCP.  Pt needing asistance with getting PCP- Appt [facility] at [facility] and [facility]. Appt set for Aug 27th-Wed at 2:45 with Dr Doreene Burke.  Pt given appt information and understands importance of keeping appt . Written information regarding location, phone number and address of [place] given to patient.

## 2013-05-31 NOTE — Progress Notes (Signed)
Pt refused to be set up on [email or web address] b/c he states he does not have a computer at home.

## 2013-05-31 NOTE — Progress Notes (Signed)
Discharge summary sent to payer through [facility]  

## 2013-06-01 MED ORDER — HYDROCODONE-ACETAMINOPHEN 5-325 MG PO TABS
ORAL_TABLET | ORAL | Status: AC
  Filled 2013-06-01: qty 2

## 2013-06-05 LAB — CULTURE, BLOOD (ROUTINE X 2)
Culture: NO GROWTH
Culture: NO GROWTH

## 2013-06-08 ENCOUNTER — Ambulatory Visit: Payer: Medicare Other

## 2013-07-03 ENCOUNTER — Encounter (HOSPITAL_COMMUNITY): Payer: Self-pay | Admitting: *Deleted

## 2013-07-03 ENCOUNTER — Emergency Department (HOSPITAL_COMMUNITY): Payer: Medicare Other

## 2013-07-03 ENCOUNTER — Inpatient Hospital Stay (HOSPITAL_COMMUNITY)
Admission: EM | Admit: 2013-07-03 | Discharge: 2013-07-05 | DRG: 637 | Disposition: A | Discharge status: Home / Self Care | Payer: Medicare Other | Attending: Internal Medicine | Admitting: Internal Medicine

## 2013-07-03 DIAGNOSIS — D638 Anemia in other chronic diseases classified elsewhere: Secondary | ICD-10-CM

## 2013-07-03 DIAGNOSIS — N182 Chronic kidney disease, stage 2 (mild): Secondary | ICD-10-CM

## 2013-07-03 DIAGNOSIS — R600 Localized edema: Secondary | ICD-10-CM

## 2013-07-03 DIAGNOSIS — Z87891 Personal history of nicotine dependence: Secondary | ICD-10-CM

## 2013-07-03 DIAGNOSIS — E871 Hypo-osmolality and hyponatremia: Secondary | ICD-10-CM

## 2013-07-03 DIAGNOSIS — Z9119 Patient's noncompliance with other medical treatment and regimen: Secondary | ICD-10-CM

## 2013-07-03 DIAGNOSIS — I10 Essential (primary) hypertension: Secondary | ICD-10-CM

## 2013-07-03 DIAGNOSIS — Z91199 Patient's noncompliance with other medical treatment and regimen due to unspecified reason: Secondary | ICD-10-CM

## 2013-07-03 DIAGNOSIS — R7309 Other abnormal glucose: Secondary | ICD-10-CM

## 2013-07-03 DIAGNOSIS — N179 Acute kidney failure, unspecified: Secondary | ICD-10-CM

## 2013-07-03 DIAGNOSIS — R4182 Altered mental status, unspecified: Secondary | ICD-10-CM

## 2013-07-03 DIAGNOSIS — R739 Hyperglycemia, unspecified: Secondary | ICD-10-CM

## 2013-07-03 DIAGNOSIS — E119 Type 2 diabetes mellitus without complications: Secondary | ICD-10-CM

## 2013-07-03 DIAGNOSIS — Z23 Encounter for immunization: Secondary | ICD-10-CM

## 2013-07-03 DIAGNOSIS — Z96649 Presence of unspecified artificial hip joint: Secondary | ICD-10-CM

## 2013-07-03 DIAGNOSIS — E1100 Type 2 diabetes mellitus with hyperosmolarity without nonketotic hyperglycemic-hyperosmolar coma (NKHHC): Principal | ICD-10-CM

## 2013-07-03 DIAGNOSIS — I129 Hypertensive chronic kidney disease with stage 1 through stage 4 chronic kidney disease, or unspecified chronic kidney disease: Secondary | ICD-10-CM | POA: Diagnosis present

## 2013-07-03 DIAGNOSIS — G9340 Encephalopathy, unspecified: Secondary | ICD-10-CM | POA: Diagnosis present

## 2013-07-03 DIAGNOSIS — E1110 Type 2 diabetes mellitus with ketoacidosis without coma: Secondary | ICD-10-CM

## 2013-07-03 DIAGNOSIS — D62 Acute posthemorrhagic anemia: Secondary | ICD-10-CM

## 2013-07-03 LAB — BASIC METABOLIC PANEL
BUN: 30 mg/dL — ABNORMAL HIGH (ref 6–23)
CO2: 24 mEq/L (ref 19–32)
Calcium: 8.7 mg/dL (ref 8.4–10.5)
Chloride: 96 mEq/L (ref 96–112)
Creatinine, Ser: 1.47 mg/dL — ABNORMAL HIGH (ref 0.50–1.35)
GFR calc Af Amer: 55 mL/min — ABNORMAL LOW (ref >90)
GFR calc non Af Amer: 47 mL/min — ABNORMAL LOW (ref >90)
Glucose, Bld: 488 mg/dL — ABNORMAL HIGH (ref 70–99)
Potassium: 4.3 mEq/L (ref 3.5–5.1)
Sodium: 132 mEq/L — ABNORMAL LOW (ref 135–145)

## 2013-07-03 LAB — URINALYSIS, ROUTINE W REFLEX MICROSCOPIC
Bilirubin Urine: NEGATIVE
Glucose, UA: >1000 mg/dL — AB
Hgb urine dipstick: NEGATIVE
Ketones, ur: NEGATIVE mg/dL
Leukocytes, UA: NEGATIVE
Nitrite: NEGATIVE
Protein, ur: NEGATIVE mg/dL
Specific Gravity, Urine: 1.034 — ABNORMAL HIGH (ref 1.005–1.030)
Urobilinogen, UA: 0.2 mg/dL (ref 0.0–1.0)
pH: 5.5 (ref 5.0–8.0)

## 2013-07-03 LAB — ETHANOL: Alcohol, Ethyl (B): <11 mg/dL (ref 0–11)

## 2013-07-03 LAB — GLUCOSE, CAPILLARY
Glucose-Capillary: >600 mg/dL (ref 70–99)
Glucose-Capillary: >600 mg/dL (ref 70–99)
Glucose-Capillary: 504 mg/dL — ABNORMAL HIGH (ref 70–99)
Glucose-Capillary: 361 mg/dL — ABNORMAL HIGH (ref 70–99)
Glucose-Capillary: 247 mg/dL — ABNORMAL HIGH (ref 70–99)

## 2013-07-03 LAB — URINE MICROSCOPIC-ADD ON

## 2013-07-03 LAB — COMPREHENSIVE METABOLIC PANEL
ALT: 12 U/L (ref 0–53)
AST: 22 U/L (ref 0–37)
Albumin: 3.3 g/dL — ABNORMAL LOW (ref 3.5–5.2)
Alkaline Phosphatase: 100 U/L (ref 39–117)
BUN: 35 mg/dL — ABNORMAL HIGH (ref 6–23)
CO2: 24 mEq/L (ref 19–32)
Calcium: 8.2 mg/dL — ABNORMAL LOW (ref 8.4–10.5)
Chloride: 90 mEq/L — ABNORMAL LOW (ref 96–112)
Creatinine, Ser: 1.75 mg/dL — ABNORMAL HIGH (ref 0.50–1.35)
GFR calc Af Amer: 44 mL/min — ABNORMAL LOW (ref >90)
GFR calc non Af Amer: 38 mL/min — ABNORMAL LOW (ref >90)
Glucose, Bld: 826 mg/dL (ref 70–99)
Potassium: 4.8 mEq/L (ref 3.5–5.1)
Sodium: 126 mEq/L — ABNORMAL LOW (ref 135–145)
Total Bilirubin: 0.5 mg/dL (ref 0.3–1.2)
Total Protein: 6.6 g/dL (ref 6.0–8.3)

## 2013-07-03 LAB — BLOOD GAS, VENOUS
Acid-base deficit: 0.6 mmol/L (ref 0.0–2.0)
Bicarbonate: 23.7 mEq/L (ref 20.0–24.0)
O2 Saturation: 84.6 %
Patient temperature: 98.6
TCO2: 21.7 mmol/L (ref 0–100)
pCO2, Ven: 39.9 mmHg — ABNORMAL LOW (ref 45.0–50.0)
pH, Ven: 7.391 — ABNORMAL HIGH (ref 7.250–7.300)
pO2, Ven: 48.9 mmHg — ABNORMAL HIGH (ref 30.0–45.0)

## 2013-07-03 LAB — CBC WITH DIFFERENTIAL/PLATELET
Basophils Absolute: 0 10*3/uL (ref 0.0–0.1)
Basophils Relative: 0 % (ref 0–1)
Eosinophils Absolute: 0.3 10*3/uL (ref 0.0–0.7)
Eosinophils Relative: 4 % (ref 0–5)
HCT: 33.7 % — ABNORMAL LOW (ref 39.0–52.0)
Hemoglobin: 11.2 g/dL — ABNORMAL LOW (ref 13.0–17.0)
Lymphocytes Relative: 23 % (ref 12–46)
Lymphs Abs: 1.4 10*3/uL (ref 0.7–4.0)
MCH: 29.9 pg (ref 26.0–34.0)
MCHC: 33.2 g/dL (ref 30.0–36.0)
MCV: 89.9 fL (ref 78.0–100.0)
Monocytes Absolute: 0.5 10*3/uL (ref 0.1–1.0)
Monocytes Relative: 8 % (ref 3–12)
Neutro Abs: 3.9 10*3/uL (ref 1.7–7.7)
Neutrophils Relative %: 64 % (ref 43–77)
Platelets: 172 10*3/uL (ref 150–400)
RBC: 3.75 MIL/uL — ABNORMAL LOW (ref 4.22–5.81)
RDW: 14.8 % (ref 11.5–15.5)
WBC: 6.1 10*3/uL (ref 4.0–10.5)

## 2013-07-03 LAB — RAPID URINE DRUG SCREEN, HOSP PERFORMED
Amphetamines: NOT DETECTED
Barbiturates: NOT DETECTED
Benzodiazepines: POSITIVE — AB
Cocaine: NOT DETECTED
Opiates: POSITIVE — AB
Tetrahydrocannabinol: NOT DETECTED

## 2013-07-03 LAB — TROPONIN I: Troponin I: <0.3 ng/mL (ref <0.30)

## 2013-07-03 LAB — CG4 I-STAT (LACTIC ACID): Lactic Acid, Venous: 1.53 mmol/L (ref 0.5–2.2)

## 2013-07-03 MED ORDER — SODIUM CHLORIDE 0.9 % IV SOLN
INTRAVENOUS | Status: DC
  Administered 2013-07-03: 5.4 [IU]/h via INTRAVENOUS
  Filled 2013-07-03: qty 1

## 2013-07-03 MED ORDER — SODIUM CHLORIDE 0.9 % IV SOLN
INTRAVENOUS | Status: DC
  Administered 2013-07-03: 3 [IU]/h via INTRAVENOUS
  Filled 2013-07-03: qty 1

## 2013-07-03 MED ORDER — ALPRAZOLAM 1 MG PO TABS
2.0000 mg | ORAL_TABLET | Freq: Two times a day (BID) | ORAL | Status: DC | PRN
  Administered 2013-07-04: 2 mg via ORAL
  Filled 2013-07-03: qty 2

## 2013-07-03 MED ORDER — SODIUM CHLORIDE 0.9 % IV BOLUS (SEPSIS)
1000.0000 mL | Freq: Once | INTRAVENOUS | Status: AC
  Administered 2013-07-03: 1000 mL via INTRAVENOUS

## 2013-07-03 MED ORDER — OXYCODONE HCL 5 MG PO TABS
15.0000 mg | ORAL_TABLET | ORAL | Status: DC | PRN
  Administered 2013-07-03 – 2013-07-05 (×5): 15 mg via ORAL
  Filled 2013-07-03 (×5): qty 3

## 2013-07-03 MED ORDER — POTASSIUM CHLORIDE 10 MEQ/100ML IV SOLN
10.0000 meq | INTRAVENOUS | Status: AC
  Administered 2013-07-03 – 2013-07-04 (×2): 10 meq via INTRAVENOUS
  Filled 2013-07-03 (×2): qty 100

## 2013-07-03 MED ORDER — INSULIN ASPART 100 UNIT/ML ~~LOC~~ SOLN: 0.0000 [IU] | Freq: Three times a day (TID) | SUBCUTANEOUS | Status: DC

## 2013-07-03 MED ORDER — SODIUM CHLORIDE 0.9 % IV SOLN
1000.0000 mL | Freq: Once | INTRAVENOUS | Status: AC
  Administered 2013-07-03: 1000 mL via INTRAVENOUS

## 2013-07-03 MED ORDER — SODIUM CHLORIDE 0.9 % IV SOLN
1000.0000 mL | INTRAVENOUS | Status: DC
  Administered 2013-07-03: 1000 mL via INTRAVENOUS

## 2013-07-03 MED ORDER — HEPARIN SODIUM (PORCINE) 5000 UNIT/ML IJ SOLN
5000.0000 [IU] | Freq: Three times a day (TID) | INTRAMUSCULAR | Status: DC
  Administered 2013-07-03 – 2013-07-05 (×4): 5000 [IU] via SUBCUTANEOUS
  Filled 2013-07-03 (×8): qty 1

## 2013-07-03 MED ORDER — DEXTROSE 50 % IV SOLN: 25.0000 mL | INTRAVENOUS | Status: DC | PRN

## 2013-07-03 MED ORDER — SODIUM CHLORIDE 0.9 % IV SOLN
INTRAVENOUS | Status: DC
  Administered 2013-07-03: 22:00:00 via INTRAVENOUS

## 2013-07-03 NOTE — ED Provider Notes (Signed)
CSN: [id number]     Arrival date & time 07/03/13  1707 History   First MD Initiated Contact with Patient 07/03/13 1712     Chief Complaint  Patient presents with  . Hyperglycemia   (Consider location/radiation/quality/duration/timing/severity/associated sxs/prior Treatment) Patient is a 67 y.o. male presenting with hyperglycemia.  Hyperglycemia  Level 5 caveat due to confusion Pt with history of DM and chronic back pain brought by EMS who report his brother found him on the floor of the bathroom,unresponsive a short time prior to arrival. No witnessed fall, but there was a rail  which had been torn off the wall. He was uncooperative for EMS but there were able to check a CBG which was >600. He states he was mixing cement to put ceramic tile in his bathroom and slipped on some water. EMS states this was not the case, no tiling was being done.   Past Medical History  Diagnosis Date  . Diabetes mellitus without complication   . Hypertension   . Hernia   . Back pain    Past Surgical History  Procedure Laterality Date  . No past surgeries    . Hip arthroplasty Right 02/05/2013    Procedure: ARTHROPLASTY BIPOLAR HIP;  Surgeon: [name], MD;  Location: [facility] ORS;  Service: Orthopedics;  Laterality: Right;   No family history on file. History  Substance Use Topics  . Smoking status: Former Smoker -- 0.25 packs/day for 30 years    Types: Cigarettes    Quit date: 01/31/2013  . Smokeless tobacco: Never Used  . Alcohol Use: No    Review of Systems Unable to assess due to confusion  Allergies  Review of patient's allergies indicates no known allergies.  Home Medications   Current Outpatient Rx  Name  Route  Sig  Dispense  Refill  . cholecalciferol (VITAMIN D) 1000 UNITS tablet   Oral   Take 1,000 Units by mouth every morning.         . ferrous sulfate 325 (65 FE) MG tablet   Oral   Take 1 tablet (325 mg total) by mouth 3 (three) times daily after meals.   45 tablet    0   . insulin aspart protamine- aspart (NOVOLOG MIX 70/30) (70-30) 100 UNIT/ML injection   Subcutaneous   Inject 0.15 mL (15 Units total) into the skin 2 (two) times daily with a meal.   10 mL   0   . oxycodone (ROXICODONE) 30 MG immediate release tablet   Oral   Take 0.5 tablets (15 mg total) by mouth every 4 (four) hours as needed for pain.   30 tablet   0   . ramipril (ALTACE) 10 MG capsule   Oral   Take 10 mg by mouth every morning.          Pulse 92  Temp(Src) 98.1 F (36.7 C) (Oral)  Resp 20  SpO2 95% Physical Exam  Nursing note and vitals reviewed. Constitutional: He appears well-developed and well-nourished.  HENT:  Head: Normocephalic and atraumatic.  Eyes: EOM are normal. Pupils are equal, round, and reactive to light.  Neck: Normal range of motion. Neck supple.  Cardiovascular: Normal rate, normal heart sounds and intact distal pulses.   Pulmonary/Chest: Effort normal and breath sounds normal.  Abdominal: Bowel sounds are normal. He exhibits no distension. There is no tenderness.  Musculoskeletal: Normal range of motion. He exhibits no edema and no tenderness.  Neurological: He is alert. He has normal strength. No cranial  nerve deficit or sensory deficit.  Conversational but confused about circumstances of his fall  Skin: Skin is warm and dry. No rash noted.  Psychiatric: He has a normal mood and affect.    ED Course  Procedures (including critical care time) Labs Review Labs Reviewed  CBC WITH DIFFERENTIAL - Abnormal; Notable for the following:    RBC 3.75 (*)    Hemoglobin 11.2 (*)    HCT 33.7 (*)    All other components within normal limits  COMPREHENSIVE METABOLIC PANEL - Abnormal; Notable for the following:    Sodium 126 (*)    Chloride 90 (*)    Glucose, Bld 826 (*)    BUN 35 (*)    Creatinine, Ser 1.75 (*)    Calcium 8.2 (*)    Albumin 3.3 (*)    GFR calc non Af Amer 38 (*)    GFR calc Af Amer 44 (*)    All other components within normal  limits  URINALYSIS, ROUTINE W REFLEX MICROSCOPIC - Abnormal; Notable for the following:    Specific Gravity, Urine 1.034 (*)    Glucose, UA >1000 (*)    All other components within normal limits  BLOOD GAS, VENOUS - Abnormal; Notable for the following:    pH, Ven 7.391 (*)    pCO2, Ven 39.9 (*)    pO2, Ven 48.9 (*)    All other components within normal limits  URINE RAPID DRUG SCREEN (HOSP PERFORMED) - Abnormal; Notable for the following:    Opiates POSITIVE (*)    Benzodiazepines POSITIVE (*)    All other components within normal limits  GLUCOSE, CAPILLARY - Abnormal; Notable for the following:    Glucose-Capillary >600 (*)    All other components within normal limits  TROPONIN I  ETHANOL  URINE MICROSCOPIC-ADD ON  CG4 I-STAT (LACTIC ACID)   Imaging Review Dg Chest 2 View  07/03/2013   CLINICAL DATA:  Syncope and hyperglycemia  EXAM: CHEST  2 VIEW  COMPARISON:  May 30, 2013  FINDINGS: The lungs are clear. Heart size and pulmonary vascularity are normal. No adenopathy. There is a stable anterior wedge compression fracture in the mid thoracic spine.  IMPRESSION: No edema or consolidation. Stable wedge compression fracture mid thoracic spine.   Electronically Signed   By: Lowella Grip   On: 07/03/2013 17:31   Ct Head Wo Contrast  07/03/2013   CLINICAL DATA:  Syncopal episode today. Weakness.  EXAM: CT HEAD WITHOUT CONTRAST  TECHNIQUE: Contiguous axial images were obtained from the base of the skull through the vertex without intravenous contrast.  COMPARISON:  Head CT 04/1913 and 11/02/2008.  FINDINGS: There is no evidence of acute intracranial hemorrhage, mass lesion, brain edema or extra-axial fluid collection. The ventricles and subarachnoid spaces are appropriately sized for age. There is no CT evidence of acute cortical infarction. .  The visualized paranasal sinuses, mastoid air cells and middle ears are clear. The calvarium is intact.  IMPRESSION: Stable examination.  No acute  intracranial findings.   Electronically Signed   By: Camie Patience   On: 07/03/2013 19:34    MDM   1. Hyperglycemia   2. Altered mental status   3. Acute blood loss anemia      Date: 07/03/2013  Rate: 94  Rhythm: normal sinus rhythm  QRS Axis: left  Intervals: normal  ST/T Wave abnormalities: nonspecific T wave changes  Conduction Disutrbances:left anterior fascicular block  Narrative Interpretation:   Old EKG Reviewed: unchanged  Pt  with marked hyperglycemia but no signs of DKA. He has mild confusion, may be from hyperglycemia vs large amount of opiates he takes at home. Discussed with Dr. Doyle Askew who will admit for further eval. IVF and Insulin drip ordered.       Charles B. Karle Starch, MD 07/03/13 2005

## 2013-07-03 NOTE — ED Notes (Signed)
Per ems: pt's brother found pt unconscious on bathroom floor. Called ems, pt's cbg read over 600. Rail in bathroom was pulled off wall - assumed pt was getting up from bathroom and fell down. Pt a+ox4, but was refusing to let ems bring him to hospital. ST on monitor, HR 126. bp 166/92, respirations 20

## 2013-07-03 NOTE — ED Notes (Signed)
Pt aware urine sample needed 

## 2013-07-03 NOTE — ED Notes (Signed)
Bed: RESA Expected date:  Expected time:  Means of arrival:  Comments: EMS-Hyperglycemia

## 2013-07-03 NOTE — ED Notes (Signed)
Patient transported to CT 

## 2013-07-03 NOTE — ED Notes (Signed)
Pt to xray

## 2013-07-03 NOTE — H&P (Signed)
[facility] History and Physical  [name] [name] [id number] DOB: [birth year] DOA: 07/03/2013  Referring physician: ED physician PCP: No primary provider on file.   Chief Complaint: Fall and confusion   HPI:  Pt is 67 yo male with HTN, diabetes mellitus, chronic back pain, who presents to [facility] ED via EMS after found on the floor unresponsive at his home. Pt is unable to provide clear history at the time of the admission but explains he was working in the bathroom putting tiles up and he slipped and fell. He is unsure how long he remained on the floors and is not aware of any preceding symptoms. He currently denies chest pain or shortness of breath but feels tired overall. He also explains he has not been taking his medications as prescribed and is not sure how much insulin he is supposed to be taking. He denies any specific focal neurological symptoms, no fevers, chills, no abdominal or urinary concerns.   In ED, pt somewhat confused but overall hemodynamically stable, CBG with glucose > 800. TRH asked to admit to telemetry floor for further evaluation of fall etiology and management of hyperglycemia.   Assessment and Plan:  Principal Problem:   Falls and confusion  - this appears to be multifactorial and secondary to dehydration from uncontrolled hyperglycemia, poor oral intake and use of narcotics - will admit to telemetry bed for further evaluation and management - will place order for PT evaluation and provide supportive care with IVF, analgesia as needed  - CT head unremarkable  Active Problems:   DIABETES MELLITUS, TYPE II - uncontrolled and last A1C > 9 (August 2014) - wil continue insulin drip for now and plan on transitioning to long acting insulin once CBG's controlled - keep NPO while pt on insulin drip - once pt off insulin drip, diet can be advanced  - diabetic educator consult    Hyponatremia - secondary to pre renal etiology, dehydration from hyperglycemia  - IVF as  noted above and repeat BMP in AM   CKD (chronic kidney disease) stage 2, GFR 60-89 ml/min - Cr slightly elevated - will hold ACEI for now and place on Hydralazine scheduled and as needed for BP control - BMP in AM   Anemia of chronic disease - Hg and Hct stable and at pt's baseline  - CBC in AM   HTN (hypertension) - BP above target range - hold ACEI as noted above due to increase in Cr - place on Hydralazine for now   Code Status: Full Family Communication: Pt at bedside Disposition Plan: Admit to telemetry bed   Review of Systems:  Constitutional: Negative for fever, chills. Negative for diaphoresis.  HENT: Negative for hearing loss, ear pain, nosebleeds, congestion, sore throat, neck pain, tinnitus and ear discharge.   Eyes: Negative for blurred vision, double vision, photophobia, pain, discharge and redness.  Respiratory: Negative for cough, hemoptysis, sputum production, shortness of breath, wheezing and stridor.   Cardiovascular: Negative for chest pain, palpitations, orthopnea, claudication and leg swelling.  Gastrointestinal: Negative for heartburn, constipation, blood in stool and melena.  Genitourinary: Negative for dysuria, urgency, frequency, hematuria and flank pain.  Musculoskeletal: Negative for myalgias, joint pain.  Skin: Negative for itching and rash.  Neurological: Negative for tingling, tremors, sensory change, speech change, focal weakness, and headaches.  Endo/Heme/Allergies: Negative for environmental allergies and polydipsia. Does not bruise/bleed easily.  Psychiatric/Behavioral: Negative for suicidal ideas. The patient is not nervous/anxious.      Past Medical History  Diagnosis  Date  . Diabetes mellitus without complication   . Hypertension   . Hernia   . Back pain     Past Surgical History  Procedure Laterality Date  . No past surgeries    . Hip arthroplasty Right 02/05/2013    Procedure: ARTHROPLASTY BIPOLAR HIP;  Surgeon: [name], MD;   Location: [facility]S;  Service: Orthopedics;  Laterality: Right;    Social History:  reports that he quit smoking about 5 months ago. His smoking use included Cigarettes. He has a 7.5 pack-year smoking history. He has never used smokeless tobacco. He reports that he does not drink alcohol or use illicit drugs.  No Known Allergies  No known family medical history.  Medication Sig  alprazolam (XANAX) 2 MG tablet Take 2 mg by mouth 2 times daily as needed for anxiety.   NOVOLOG (70-30) 100 UNIT/ML inj Inject 20 Units into the skin 2  times daily with a meal.  oxycodone 30 MGIR tablet Take 0.5 tablets every 4 (four) hours as needed for pain.  ramipril (ALTACE) 10 MG capsule Take 10 mg by mouth every morning.   Physical Exam: Filed Vitals:   07/03/13 1716 07/03/13 1844 07/03/13 1931 07/03/13 1946  BP:  141/65  178/76  Pulse: 92 69  53  Temp: 98.1 F (36.7 C)     TempSrc: Oral     Resp: 20 22  15   Height:   5' 9.5" (1.765 m)   Weight:   64.864 kg (143 lb)   SpO2: 95% 93%  99%    Physical Exam  Constitutional: Appears well-developed and well-nourished. No distress.  HENT: Normocephalic. External right and left ear normal. Dry MM Eyes: Conjunctivae and EOM are normal. PERRLA, no scleral icterus.  Neck: Normal ROM. Neck supple. No JVD. No tracheal deviation. No thyromegaly.  CVS: RRR, S1/S2 +, no murmurs, no gallops, no carotid bruit.  Pulmonary: Effort and breath sounds normal, no stridor, rhonchi, wheezes, rales.  Abdominal: Soft. BS +,  no distension, tenderness, rebound or guarding.  Musculoskeletal: Normal range of motion. No edema and no tenderness.  Lymphadenopathy: No lymphadenopathy noted, cervical, inguinal. Neuro: Alert but somewhat confused. Normal reflexes, muscle tone coordination. No cranial nerve deficit. Skin: Skin is warm and dry. No rash noted. Not diaphoretic. No erythema. No pallor.  Psychiatric: Normal mood and affect. Behavior, judgment, thought content normal.    Labs on Admission:  Basic Metabolic Panel:  Recent Labs Lab 07/03/13 1800  NA 126*  K 4.8  CL 90*  CO2 24  GLUCOSE 826*  BUN 35*  CREATININE 1.75*  CALCIUM 8.2*   Liver Function Tests:  Recent Labs Lab 07/03/13 1800  AST 22  ALT 12  ALKPHOS 100  BILITOT 0.5  PROT 6.6  ALBUMIN 3.3*   CBC:  Recent Labs Lab 07/03/13 1800  WBC 6.1  NEUTROABS 3.9  HGB 11.2*  HCT 33.7*  MCV 89.9  PLT 172   Cardiac Enzymes:  Recent Labs Lab 07/03/13 1800  TROPONINI <0.30   CBG:  Recent Labs Lab 07/03/13 1934  GLUCAP >600*    Radiological Exams on Admission: Dg Chest 2 View  07/03/2013   CLINICAL DATA:  Syncope and hyperglycemia  EXAM: CHEST  2 VIEW  COMPARISON:  May 30, 2013  FINDINGS: The lungs are clear. Heart size and pulmonary vascularity are normal. No adenopathy. There is a stable anterior wedge compression fracture in the mid thoracic spine.  IMPRESSION: No edema or consolidation. Stable wedge compression fracture mid  thoracic spine.   Electronically Signed   By: Lowella Grip   On: 07/03/2013 17:31   Ct Head Wo Contrast  07/03/2013   CLINICAL DATA:  Syncopal episode today. Weakness.  EXAM: CT HEAD WITHOUT CONTRAST  TECHNIQUE: Contiguous axial images were obtained from the base of the skull through the vertex without intravenous contrast.  COMPARISON:  Head CT 04/1913 and 11/02/2008.  FINDINGS: There is no evidence of acute intracranial hemorrhage, mass lesion, brain edema or extra-axial fluid collection. The ventricles and subarachnoid spaces are appropriately sized for age. There is no CT evidence of acute cortical infarction. .  The visualized paranasal sinuses, mastoid air cells and middle ears are clear. The calvarium is intact.  IMPRESSION: Stable examination.  No acute intracranial findings.   Electronically Signed   By: Camie Patience   On: 07/03/2013 19:34    EKG: Normal sinus rhythm, no ST/T wave changes  Faye Ramsay, MD  [facility] Pager [phone]  If 7PM-7AM, please contact night-coverage [email or web address] Password [facility] 07/03/2013, 7:55 PM

## 2013-07-04 DIAGNOSIS — E1101 Type 2 diabetes mellitus with hyperosmolarity with coma: Secondary | ICD-10-CM

## 2013-07-04 DIAGNOSIS — N179 Acute kidney failure, unspecified: Secondary | ICD-10-CM

## 2013-07-04 DIAGNOSIS — D638 Anemia in other chronic diseases classified elsewhere: Secondary | ICD-10-CM

## 2013-07-04 LAB — LIPID PANEL
Cholesterol: 197 mg/dL (ref 0–200)
HDL: 59 mg/dL (ref >39)
LDL Cholesterol: 100 mg/dL — ABNORMAL HIGH (ref 0–99)
Total CHOL/HDL Ratio: 3.3 RATIO
Triglycerides: 190 mg/dL — ABNORMAL HIGH (ref <150)
VLDL: 38 mg/dL (ref 0–40)

## 2013-07-04 LAB — GLUCOSE, CAPILLARY
Glucose-Capillary: 107 mg/dL — ABNORMAL HIGH (ref 70–99)
Glucose-Capillary: 96 mg/dL (ref 70–99)
Glucose-Capillary: 100 mg/dL — ABNORMAL HIGH (ref 70–99)
Glucose-Capillary: 131 mg/dL — ABNORMAL HIGH (ref 70–99)
Glucose-Capillary: 120 mg/dL — ABNORMAL HIGH (ref 70–99)
Glucose-Capillary: 125 mg/dL — ABNORMAL HIGH (ref 70–99)
Glucose-Capillary: 420 mg/dL — ABNORMAL HIGH (ref 70–99)
Glucose-Capillary: 220 mg/dL — ABNORMAL HIGH (ref 70–99)
Glucose-Capillary: 312 mg/dL — ABNORMAL HIGH (ref 70–99)

## 2013-07-04 LAB — CBC
HCT: 32.5 % — ABNORMAL LOW (ref 39.0–52.0)
Hemoglobin: 10.7 g/dL — ABNORMAL LOW (ref 13.0–17.0)
MCH: 29 pg (ref 26.0–34.0)
MCHC: 32.9 g/dL (ref 30.0–36.0)
MCV: 88.1 fL (ref 78.0–100.0)
Platelets: DECREASED 10*3/uL (ref 150–400)
RBC: 3.69 MIL/uL — ABNORMAL LOW (ref 4.22–5.81)
RDW: 14.6 % (ref 11.5–15.5)
WBC: 7.6 10*3/uL (ref 4.0–10.5)

## 2013-07-04 LAB — BASIC METABOLIC PANEL
BUN: 20 mg/dL (ref 6–23)
CO2: 25 mEq/L (ref 19–32)
Calcium: 7.7 mg/dL — ABNORMAL LOW (ref 8.4–10.5)
Chloride: 105 mEq/L (ref 96–112)
Creatinine, Ser: 0.97 mg/dL (ref 0.50–1.35)
GFR calc Af Amer: >90 mL/min (ref >90)
GFR calc non Af Amer: 83 mL/min — ABNORMAL LOW (ref >90)
Glucose, Bld: 105 mg/dL — ABNORMAL HIGH (ref 70–99)
Potassium: 3.6 mEq/L (ref 3.5–5.1)
Sodium: 137 mEq/L (ref 135–145)

## 2013-07-04 MED ORDER — INSULIN ASPART 100 UNIT/ML ~~LOC~~ SOLN
15.0000 [IU] | Freq: Once | SUBCUTANEOUS | Status: AC
  Administered 2013-07-04: 15 [IU] via SUBCUTANEOUS

## 2013-07-04 MED ORDER — INSULIN ASPART 100 UNIT/ML ~~LOC~~ SOLN: 15.0000 [IU] | Freq: Once | SUBCUTANEOUS | Status: DC

## 2013-07-04 MED ORDER — SODIUM CHLORIDE 0.9 % IV SOLN
INTRAVENOUS | Status: DC
  Administered 2013-07-04 – 2013-07-05 (×3): via INTRAVENOUS

## 2013-07-04 MED ORDER — INSULIN ASPART 100 UNIT/ML ~~LOC~~ SOLN
0.0000 [IU] | Freq: Every day | SUBCUTANEOUS | Status: DC
  Administered 2013-07-04: 4 [IU] via SUBCUTANEOUS

## 2013-07-04 MED ORDER — INSULIN ASPART 100 UNIT/ML ~~LOC~~ SOLN
0.0000 [IU] | Freq: Three times a day (TID) | SUBCUTANEOUS | Status: DC
  Administered 2013-07-04: 5 [IU] via SUBCUTANEOUS
  Administered 2013-07-05: 15 [IU] via SUBCUTANEOUS
  Administered 2013-07-05: 2 [IU] via SUBCUTANEOUS

## 2013-07-04 MED ORDER — PNEUMOCOCCAL VAC POLYVALENT 25 MCG/0.5ML IJ INJ
0.5000 mL | INJECTION | INTRAMUSCULAR | Status: AC
  Filled 2013-07-04: qty 0.5

## 2013-07-04 MED ORDER — INSULIN ASPART 100 UNIT/ML ~~LOC~~ SOLN: 0.0000 [IU] | SUBCUTANEOUS | Status: DC

## 2013-07-04 NOTE — Progress Notes (Signed)
MD on the floor making rounds given update CBG 420 New orders given. No other acute changes noted in Pt's assessment at this time.

## 2013-07-04 NOTE — Discharge Summary (Signed)
Physician Discharge Summary  [name] [name] [id number] DOB: [birth year] DOA: 07/03/2013  PCP: No primary provider on file.  Admit date: 07/03/2013 Discharge date: 07/05/2013  Recommendations for Outpatient Follow-up:  1. Pt will need to follow up with PCP in 2 weeks post discharge 2. Please obtain BMP to evaluate electrolytes and kidney function 3. Please also check CBC to evaluate Hg and Hct levels   Discharge Diagnoses:  Principal Problem:   Fall Active Problems:   DIABETES MELLITUS, TYPE II   CKD (chronic kidney disease) stage 2, GFR 60-89 ml/min   Anemia of chronic disease   Hyponatremia   HTN (hypertension)   Diabetic hyperosmolar non-ketotic state   AKI (acute kidney injury) Diabetic hyperosmolar nonketotic coma  - Transitioned to subcutaneous insulin  - Hemoglobin A1c on 05/30/2012= 9.3  - Patient has very poor insight regarding his insulin  - According to the patient's daughter, he has been noncompliant with insulin  - During this hospitalization, the patient continues to complain of bland food and wanted to go home  -the patient was started back on 70/30 but prior to discharge, the patient expressed that he wanted to be changed to Lantus to help increase his compliance -Patient will be changed to Lantus 15 units at bedtime - Advance diet--tolerating diet - The patient was encouraged to check his sugars 4 times daily and keep a glycemic log which she will take to his primary care physician for further adjustment of his insulin regimen Acute kidney injury  - Serum creatinine 1.75 on the day of admission  - Improving with IV fluids  Acute encephalopathy  - Secondary to hyperglycemia and acute kidney injury  - back to baseline with tx of his [facility] - Urine drug screen positive for opiates and benzodiazepines  Diabetes mellitus type 2--uncontrolled  - Maintain on NovoLog sliding scale today before transitioning to his 70/30 insulin  - Hemoglobin A1c 9.3  Anemia of chronic  disease  - Patient is at his baseline hemoglobin  - Check iron studies  Hypertension  - Hold altace in light of his AKI  Family Communication: Updated daughter Karna Christmas   Discharge Condition: stable  Disposition: home  Diet:carb modified Wt Readings from Last 3 Encounters:  07/03/13 64.864 kg (143 lb)  05/30/13 63.1 kg (139 lb 1.8 oz)  02/02/13 64.411 kg (142 lb)    History of present illness:  67 yo male with HTN, diabetes mellitus, chronic back pain, who presents to [facility] ED via EMS after found on the floor unresponsive at his home. On the day of admission, the patient was confused and unable to provide any history. Apparently the patient was putting up some tiles in his bathroom when he slipped and fell. The patient was found by his brother. On hospital day #1, the patient denied any syncope. He denied any recent chest discomfort, shortness breath, dizziness, vomiting, diarrhea, focal extremity weakness. There's been no fevers or chills. In the emergency department, the patient was found to have a glucose>800. Anion gap was 12. He was started on intravenous insulin and IV fluids.  The patient was initially agitated and did not like the food in the hospital. He refused to eat. He threatened to leave [place]. After discussion, the patient agreed to stay. The patient was transitioned to subcutaneous insulin. His IV fluids were continued. His electrolytes improved as was his renal function. A urine drug screen showed benzodiazepines and opiates. After discussion with the patient's daughter, it was clarified that the patient has  been noncompliant with his insulin. After 24 hours of NovoLog sliding scale, the patient's 70/30 regimen was adjusted. However, the patient expressed that he wanted to be transitioned her to Lantus insulin for better compliance. As a result, the patient was changed to Lantus 15 units at bedtime at the time of discharge. The patient was encouraged to check her  sugars 4 times daily and to keep her glycemic log. He was told to take his glycemic blood to his primary care physician for further adjustment of his insulin regimen. The patient's diet was advanced and he tolerated his diet without any difficulty although he did not like the food.      Discharge Exam: Filed Vitals:   07/05/13 0412  BP: 162/91  Pulse: 57  Temp: 98.3 F (36.8 C)  Resp: 20   Filed Vitals:   07/04/13 0524 07/04/13 1320 07/04/13 2228 07/05/13 0412  BP: 141/66 168/77 178/74 162/91  Pulse: 59 70 62 57  Temp: 98.9 F (37.2 C) 98.7 F (37.1 C) 99.4 F (37.4 C) 98.3 F (36.8 C)  TempSrc: Oral Oral Oral Oral  Resp: 18 20 16 20   Height:      Weight:      SpO2: 100% 99% 98% 100%   General: A&O x 3, NAD, pleasant, cooperative Cardiovascular: RRR, no rub, no gallop, no S3 Respiratory: CTAB, no wheeze, no rhonchi Abdomen:soft, nontender, nondistended, positive bowel sounds Extremities: No edema, No lymphangitis, no petechiae  Discharge Instructions      Discharge Orders   Future Orders Complete By Expires   Diet - low sodium heart healthy  As directed    Increase activity slowly  As directed        Medication List    STOP taking these medications       insulin aspart protamine- aspart (70-30) 100 UNIT/ML injection  Commonly known as:  NOVOLOG MIX 70/30      TAKE these medications       alprazolam 2 MG tablet  Commonly known as:  XANAX  Take 2 mg by mouth 2 (two) times daily as needed for anxiety.     cholecalciferol 1000 UNITS tablet  Commonly known as:  VITAMIN D  Take 1,000 Units by mouth every morning.     insulin glargine 100 UNIT/ML injection  Commonly known as:  LANTUS  Inject 0.15 mLs (15 Units total) into the skin at bedtime.     oxycodone 30 MG immediate release tablet  Commonly known as:  ROXICODONE  Take 0.5 tablets (15 mg total) by mouth every 4 (four) hours as needed for pain.     ramipril 10 MG capsule  Commonly known as:   ALTACE  Take 10 mg by mouth every morning.         The results of significant diagnostics from this hospitalization (including imaging, microbiology, ancillary and laboratory) are listed below for reference.    Significant Diagnostic Studies: Dg Chest 2 View  07/03/2013   CLINICAL DATA:  Syncope and hyperglycemia  EXAM: CHEST  2 VIEW  COMPARISON:  May 30, 2013  FINDINGS: The lungs are clear. Heart size and pulmonary vascularity are normal. No adenopathy. There is a stable anterior wedge compression fracture in the mid thoracic spine.  IMPRESSION: No edema or consolidation. Stable wedge compression fracture mid thoracic spine.   Electronically Signed   By: Lowella Grip   On: 07/03/2013 17:31   Ct Head Wo Contrast  07/03/2013   CLINICAL DATA:  Syncopal episode today. Weakness.  EXAM: CT HEAD WITHOUT CONTRAST  TECHNIQUE: Contiguous axial images were obtained from the base of the skull through the vertex without intravenous contrast.  COMPARISON:  Head CT 04/1913 and 11/02/2008.  FINDINGS: There is no evidence of acute intracranial hemorrhage, mass lesion, brain edema or extra-axial fluid collection. The ventricles and subarachnoid spaces are appropriately sized for age. There is no CT evidence of acute cortical infarction. .  The visualized paranasal sinuses, mastoid air cells and middle ears are clear. The calvarium is intact.  IMPRESSION: Stable examination.  No acute intracranial findings.   Electronically Signed   By: Camie Patience   On: 07/03/2013 19:34     Microbiology: No results found for this or any previous visit (from the past 240 hour(s)).   Labs: Basic Metabolic Panel:  Recent Labs Lab 07/03/13 1800 07/03/13 2110 07/04/13 0405 07/05/13 0400  NA 126* 132* 137 132*  K 4.8 4.3 3.6 4.1  CL 90* 96 105 100  CO2 24 24 25 24   GLUCOSE 826* 488* 105* 342*  BUN 35* 30* 20 13  CREATININE 1.75* 1.47* 0.97 0.80  CALCIUM 8.2* 8.7 7.7* 8.4  MG  --   --   --  2.2   Liver  Function Tests:  Recent Labs Lab 07/03/13 1800  AST 22  ALT 12  ALKPHOS 100  BILITOT 0.5  PROT 6.6  ALBUMIN 3.3*   No results found for this basename: LIPASE, AMYLASE,  in the last 168 hours No results found for this basename: AMMONIA,  in the last 168 hours CBC:  Recent Labs Lab 07/03/13 1800 07/04/13 0405  WBC 6.1 7.6  NEUTROABS 3.9  --   HGB 11.2* 10.7*  HCT 33.7* 32.5*  MCV 89.9 88.1  PLT 172 PLATELET CLUMPS NOTED ON SMEAR, COUNT APPEARS DECREASED   Cardiac Enzymes:  Recent Labs Lab 07/03/13 1800  TROPONINI <0.30   BNP: No components found with this basename: POCBNP,  CBG:  Recent Labs Lab 07/04/13 1633 07/04/13 2226 07/05/13 0409 07/05/13 0727 07/05/13 1143  GLUCAP 220* 312* 301* 415* 148*    Time coordinating discharge:  Greater than 30 minutes  Signed:  Artie Mcintyre, DO [facility] Pager: [phone] 07/05/2013, 11:50 AM

## 2013-07-04 NOTE — Care Management Note (Addendum)
    Page 1 of 1   07/05/2013     12:14:36 PM   CARE MANAGEMENT NOTE 07/05/2013  Patient:  [name] [name]   Account Number:  [id number]  Date Initiated:  07/04/2013  Documentation initiated by:  Dessa Phi  Subjective/Objective Assessment:   67 Y/O M ADMITTED W/FALL,CONFUSION,ELEVATED CBG'S.HX:DM.     Action/Plan:   FROM HOME.HAS PCP,PHARMACY,GLUCOMETER.   Anticipated DC Date:  07/05/2013   Anticipated DC Plan:  [place]  CM consult      Choice offered to / List presented to:             Status of service:  Completed, signed off Medicare Important Message given?   (If response is "NO", the following Medicare IM given date fields will be blank) Date Medicare IM given:   Date Additional Medicare IM given:    Discharge Disposition:  HOME/SELF CARE  Per UR Regulation:  Reviewed for med. necessity/level of care/duration of stay  If discussed at [place] of Stay Meetings, dates discussed:    Comments:  07/05/13 Koden Hunzeker RN,BSN NCM [phone] D/C HOME NO NEEDS OR ORDERS.  07/04/13 Keltin Baird RN,BSN NCM [phone] PT-NO F/U.PATIENT NOT INTERESTED IN HHRN,STATES HE WOULD NOT WANT A NURSE.HE FEELS HE KNOWS WHEN TO CONTACT PCP,& AWARE OF S/S TO CHECK FOR.MD UPDATED.

## 2013-07-04 NOTE — Evaluation (Signed)
Physical Therapy Evaluation Patient Details Name: [name] [name] MRN: [id number] DOB: Jun 13, 1946 Today's Date: 07/04/2013 Time: [id number] PT Time Calculation (min): 17 min  PT Assessment / Plan / Recommendation History of Present Illness  Pt is 67 yo male with HTN, diabetes mellitus, chronic back pain, who presents to [facility] ED via EMS after found on the floor unresponsive at his home. Pt is unable to provide clear history at the time of the admission but explains he was working in the bathroom putting tiles up and he slipped and fell  Pt reports he had a THR last April  Clinical Impression  Pt is able to walk without assistive device on levels and steps.  He wants to go home from the hospital today and says he is walking about as usual.  He mentions that he had a THR within the last year. He does not think he needs or want any further PT    PT Assessment  Patent does not need any further PT services    Follow Up Recommendations  No PT follow up    Does the patient have the potential to tolerate intense rehabilitation      Barriers to Discharge        Equipment Recommendations  None recommended by PT    Recommendations for Other Services     Frequency      Precautions / Restrictions Precautions Precautions: Fall   Pertinent Vitals/Pain Pt says he has no pain      Mobility  Bed Mobility Bed Mobility: Supine to Sit Supine to Sit: 5: Supervision Transfers Transfers: Sit to Stand;Stand to Sit Sit to Stand: 6: Modified independent (Device/Increase time) Stand to Sit: 6: Modified independent (Device/Increase time) Details for Transfer Assistance: use of hands for sit to stand Ambulation/Gait Ambulation/Gait Assistance: 5: Supervision Ambulation Distance (Feet): 150 Feet Assistive device: None Gait Pattern: Wide base of support Gait velocity: wfl General Gait Details: pt upset that we have to "go through all this" since he didn't have to do balance testing after THR.  He has some  technique impairments with gait, but is likely at his baseline Stairs: Yes Stairs Assistance: 6: Modified independent (Device/Increase time) Stair Management Technique: One rail Left;Alternating pattern Number of Stairs: 3 Wheelchair Mobility Wheelchair Mobility: No    Exercises     PT Diagnosis:    PT Problem List:   PT Treatment Interventions:       PT Goals(Current goals can be found in the care plan section) Acute Rehab PT Goals Patient Stated Goal: to go home today  Visit Information  Last PT Received On: 07/04/13 Assistance Needed: +1 History of Present Illness: Pt is 67 yo male with HTN, diabetes mellitus, chronic back pain, who presents to [facility] ED via EMS after found on the floor unresponsive at his home. Pt is unable to provide clear history at the time of the admission but explains he was working in the bathroom putting tiles up and he slipped and fell  Pt reports he had a THR last April       Prior [place] expects to be discharged to:: Private residence Living Arrangements: Other relatives (brother) Available Help at Discharge: Family Type of Home: House Home Access: Stairs to enter [employer] of Steps: can enter through back door-no steps [place]: Two level;Able to live on main level with bedroom/bathroom;1/2 bath on main level;Laundry or work area in [place]: [place] - 2 wheels;Crutches Prior Function Level of Independence: Independent  Communication Communication: No difficulties    Cognition  Cognition Arousal/Alertness: Awake/alert Behavior During Therapy: WFL for tasks assessed/performed Overall Cognitive Status: Within Functional Limits for tasks assessed    Extremity/Trunk Assessment Lower Extremity Assessment Lower Extremity Assessment: Overall WFL for tasks assessed Cervical / Trunk Assessment Cervical / Trunk Assessment: Normal   Balance Balance Balance Assessed: Yes Static Sitting  Balance Static Sitting - Balance Support: No upper extremity supported Static Sitting - Level of Assistance: 7: Independent Standardized Balance Assessment Standardized Balance Assessment: Berg Balance Test;Dynamic Gait Index Berg Balance Test Sit to Stand: Able to stand  independently using hands Standing Unsupported: Able to stand safely 2 minutes Sitting with Back Unsupported but Feet Supported on Floor or Stool: Able to sit safely and securely 2 minutes Stand to Sit: Sits safely with minimal use of hands Transfers: Able to transfer safely, definite need of hands Standing Unsupported with Eyes Closed: Able to stand 10 seconds safely Standing Ubsupported with Feet Together: Able to place feet together independently and stand 1 minute safely From Standing, Reach Forward with Outstretched Arm: Can reach confidently >25 cm (10") From Standing Position, Pick up Object from Floor: Able to pick up shoe safely and easily From Standing Position, Turn to Look Behind Over each Shoulder: Looks behind from both sides and weight shifts well Turn 360 Degrees: Able to turn 360 degrees safely in 4 seconds or less Standing Unsupported, Alternately Place Feet on Step/Stool: Able to stand independently and safely and complete 8 steps in 20 seconds Standing Unsupported, One Foot in Front: Able to take small step independently and hold 30 seconds Standing on One Leg: Able to lift leg independently and hold equal to or more than 3 seconds Total Score: 50 Dynamic Gait Index Level Surface: Normal Change in Gait Speed: Normal Gait with Horizontal Head Turns: Normal Gait with Vertical Head Turns: Mild Impairment Gait and Pivot Turn: Normal Step Over Obstacle: Normal Step Around Obstacles: Normal Steps: Mild Impairment Total Score: 22  End of Session PT - End of Session Activity Tolerance: Patient tolerated treatment well Patient left: in chair;with chair alarm set;with nursing/sitter in room Nurse  Communication: Mobility status  GP    [name] [name]. [place], [place] 07/04/2013, 10:02 AM

## 2013-07-04 NOTE — Clinical Documentation Improvement (Signed)
THIS DOCUMENT IS NOT A PERMANENT PART OF THE MEDICAL RECORD  Please update your documentation with the medical record to reflect your response to this query. If you need help knowing how to do this please call [phone].  07/04/13  Dear Dr. Carles Collet, [name] [name]  In an effort to better capture your patient's severity of illness, reflect appropriate length of stay and utilization of resources, a review of the patient medical record has revealed the following indicators.    Based on your clinical judgment, please clarify and document in a progress note and/or discharge summary the clinical condition associated with the following supporting information:  In responding to this query please exercise your independent judgment.  The fact that a query is asked, does not imply that any particular answer is desired or expected.   Pt w/ Anemia per H&P  ED note states pt with Acute Blood loss anemia  Clarification Needed  Please clarify if you agree pt with ABLA or another diagnosis and document in pn or d/c summary    Possible Clinical Conditions?   " Expected Acute Blood Loss Anemia  " Acute Blood Loss Anemia  " Acute on chronic blood loss anemia   " Other Condition________________  " Cannot Clinically Determine  Risk Factors: (recent surgery, pre op anemia, EBL in OR)  Supporting Information:  Signs and Symptoms    Diagnostics: Component      Hemoglobin HCT  Latest Ref Rng      13.0 - 17.0 g/dL 39.0 - 52.0 %  07/03/2013     6:00 PM 11.2 (L) 33.7 (L)   Component      Hemoglobin HCT  Latest Ref Rng      13.0 - 17.0 g/dL 39.0 - 52.0 %  07/04/2013     4:05 AM 10.7 (L) 32.5 (L)   Treatments: Monitoring  Reviewed:  no additional documentation provided ljh  Thank You,  Heloise Beecham  RN, BSN, MSN/Inf, CCDS Clinical Documentation Specialist [facility] Pager: [phone] / E-mail: [email or web address] [place]

## 2013-07-04 NOTE — Progress Notes (Signed)
Inpatient Diabetes Program Recommendations  AACE/ADA: New Consensus Statement on Inpatient Glycemic Control (2013)  Target Ranges:  Prepandial:   less than 140 mg/dL      Peak postprandial:   less than 180 mg/dL (1-2 hours)      Critically ill patients:  140 - 180 mg/dL   Reason for Visit: Diabetes Consult  Very familiar with pt from previous admissions.  Pt states he was carrying tiles to another room and tripped on them and fell.  States he missed his appt at [facility] d/t no transportation.  "My brother and sisters all work and they couldn't take me." States he checks blood sugars at home and ususally takes his insulin. Discussed results of HgbA1C and importance of keeping blood sugars in control.  He was able to tell me what a normal blood sugar was, and said he treated low blood sugars with "banana moon pies." Discussed Hypoglycemia s/s and treatment and pt verbalized understanding.  Results for [name] [name], [name] [name] (MRN [id number]) as of 07/04/2013 11:33  Ref. Range 07/03/2013 21:10 07/04/2013 04:05  Sodium Latest Range: 135-145 mEq/L 132 (L) 137  Potassium Latest Range: 3.5-5.1 mEq/L 4.3 3.6  Chloride Latest Range: 96-112 mEq/L 96 105  CO2 Latest Range: 19-32 mEq/L 24 25  BUN Latest Range: 6-23 mg/dL 30 (H) 20  Creatinine Latest Range: 0.50-1.35 mg/dL 1.47 (H) 0.97  Calcium Latest Range: 8.4-10.5 mg/dL 8.7 7.7 (L)  GFR calc non Af Amer Latest Range: >90 mL/min 47 (L) 83 (L)  GFR calc Af Amer Latest Range: >90 mL/min 55 (L) >90  Glucose Latest Range: 70-99 mg/dL 488 (H) 105 (H)  Results for [name] [name], [name] [name] (MRN [id number]) as of 07/04/2013 11:33  Ref. Range 07/04/2013 02:07 07/04/2013 03:24 07/04/2013 04:50 07/04/2013 07:07 07/04/2013 07:12  Glucose-Capillary Latest Range: 70-99 mg/dL 96 107 (H) 100 (H) 120 (H) 125 (H)  Results for [name] [name], [name] [name] (MRN [id number]) as of 07/04/2013 11:33  Ref. Range 05/30/2013 05:09  Hemoglobin A1C Latest Range: <5.7 % 9.3 (H)    Inpatient  Diabetes Program Recommendations Insulin - Basal: Begin 70/30 10 units bid Correction (SSI): Add HS correction Outpatient Referral: F/U with [facility] Diet: When advanced, CHO mod med  Pt appears to need much support with diabetes management at home. Agree with 70/30 insulin bid.   Again stressed importance of checking blood sugars 3 - 4 times/day and f/u with new PCP at [facility]. Pt voiced understanding.  Thank you. Lorenda Peck, RD, LDN, CDE Inpatient Diabetes Coordinator [phone]

## 2013-07-04 NOTE — Progress Notes (Signed)
[facility] PROGRESS NOTE  [place] [id number] DOB: [birth year] DOA: 07/03/2013 PCP: No primary provider on file.  Brief history 67 yo male with HTN, diabetes mellitus, chronic back pain, who presents to [facility] ED via EMS after found on the floor unresponsive at his home. On the day of admission, the patient was confused and unable to provide any history. Apparently the patient was putting up some tiles in his bathroom when he slipped and fell. The patient was found by his brother. On hospital day #1, the patient denied any syncope. He denied any recent chest discomfort, shortness breath, dizziness, vomiting, diarrhea, focal extremity weakness. There's been no fevers or chills. In the emergency department, the patient was found to have a glucose>800. Anion gap was 12.  He was started on intravenous insulin and IV fluids.  Assessment/Plan: Diabetic hyperosmolar nonketotic coma - Transitioned to subcutaneous insulin - Hemoglobin A1c on 05/30/2012= 9.3 - Patient has very poor insight regarding his insulin - According to the patient's daughter, he has been noncompliant with insulin - During this hospitalization, the patient continues to complain of bland food and wanted to go home - Continue IV fluids today - Advance diet Acute kidney injury - Serum creatinine 1.75 on the day of admission - Improving with IV fluids Acute encephalopathy - Secondary to hyperglycemia and acute kidney injury - Appears to be back to baseline - Urine drug screen positive for opiates and benzodiazepines Diabetes mellitus type 2--uncontrolled - Maintain on NovoLog sliding scale today before transitioning to his 70/30 insulin - Hemoglobin A1c 9.3 Anemia of chronic disease - Patient is at his baseline hemoglobin - Check iron studies Hypertension - Hold altace in light of his AKI  Family Communication:   Updated daughter Karna Christmas Disposition Plan:   Home when medically  stable       Procedures/Studies: Dg Chest 2 View  07/03/2013   CLINICAL DATA:  Syncope and hyperglycemia  EXAM: CHEST  2 VIEW  COMPARISON:  May 30, 2013  FINDINGS: The lungs are clear. Heart size and pulmonary vascularity are normal. No adenopathy. There is a stable anterior wedge compression fracture in the mid thoracic spine.  IMPRESSION: No edema or consolidation. Stable wedge compression fracture mid thoracic spine.   Electronically Signed   By: Lowella Grip   On: 07/03/2013 17:31   Ct Head Wo Contrast  07/03/2013   CLINICAL DATA:  Syncopal episode today. Weakness.  EXAM: CT HEAD WITHOUT CONTRAST  TECHNIQUE: Contiguous axial images were obtained from the base of the skull through the vertex without intravenous contrast.  COMPARISON:  Head CT 04/1913 and 11/02/2008.  FINDINGS: There is no evidence of acute intracranial hemorrhage, mass lesion, brain edema or extra-axial fluid collection. The ventricles and subarachnoid spaces are appropriately sized for age. There is no CT evidence of acute cortical infarction. .  The visualized paranasal sinuses, mastoid air cells and middle ears are clear. The calvarium is intact.  IMPRESSION: Stable examination.  No acute intracranial findings.   Electronically Signed   By: Camie Patience   On: 07/03/2013 19:34         Subjective: Patient complains of poor tasting food in the hospital. He denies any fevers, chills, chest discomfort, shortness breath, nausea, vomiting, diarrhea, abdominal pain, dysuria, hematuria.  Objective: Filed Vitals:   07/04/13 0015 07/04/13 0212 07/04/13 0524 07/04/13 1320  BP:  149/72 141/66 168/77  Pulse:  58 59 70  Temp:  98.6 F (37 C) 98.9 F (37.2 C) 98.7 F (37.1  C)  TempSrc:  Oral Oral Oral  Resp:  20 18 20   Height: 5' 9.5" (1.765 m)     Weight:      SpO2:  100% 100% 99%    Intake/Output Summary (Last 24 hours) at 07/04/13 1807 Last data filed at 07/04/13 1800  Gross per 24 hour  Intake 1131.67 ml   Output   4335 ml  Net -3203.33 ml   Weight change:  Exam:   General:  Pt is alert, follows commands appropriately, not in acute distress  HEENT: No icterus, No thrush,  [place]/AT  Cardiovascular: RRR, S1/S2, no rubs, no gallops  Respiratory: CTA bilaterally, no wheezing, no crackles, no rhonchi  Abdomen: Soft/+BS, non tender, non distended, no guarding  Extremities: trace edema, No lymphangitis, No petechiae, No rashes, no synovitis  Data Reviewed: Basic Metabolic Panel:  Recent Labs Lab 07/03/13 1800 07/03/13 2110 07/04/13 0405  NA 126* 132* 137  K 4.8 4.3 3.6  CL 90* 96 105  CO2 24 24 25   GLUCOSE 826* 488* 105*  BUN 35* 30* 20  CREATININE 1.75* 1.47* 0.97  CALCIUM 8.2* 8.7 7.7*   Liver Function Tests:  Recent Labs Lab 07/03/13 1800  AST 22  ALT 12  ALKPHOS 100  BILITOT 0.5  PROT 6.6  ALBUMIN 3.3*   No results found for this basename: LIPASE, AMYLASE,  in the last 168 hours No results found for this basename: AMMONIA,  in the last 168 hours CBC:  Recent Labs Lab 07/03/13 1800 07/04/13 0405  WBC 6.1 7.6  NEUTROABS 3.9  --   HGB 11.2* 10.7*  HCT 33.7* 32.5*  MCV 89.9 88.1  PLT 172 PLATELET CLUMPS NOTED ON SMEAR, COUNT APPEARS DECREASED   Cardiac Enzymes:  Recent Labs Lab 07/03/13 1800  TROPONINI <0.30   BNP: No components found with this basename: POCBNP,  CBG:  Recent Labs Lab 07/04/13 0450 07/04/13 0707 07/04/13 0712 07/04/13 1154 07/04/13 1633  GLUCAP 100* 120* 125* 420* 220*    No results found for this or any previous visit (from the past 240 hour(s)).   Scheduled Meds: . heparin  5,000 Units Subcutaneous Q8H  . insulin aspart  0-15 Units Subcutaneous TID WC  . insulin aspart  0-5 Units Subcutaneous QHS  . pneumococcal 23 valent vaccine  0.5 mL Intramuscular Tomorrow-1000   Continuous Infusions: . sodium chloride 50 mL/hr at 07/04/13 1007     Perl Folmar, DO  [facility] Pager [phone]  If 7PM-7AM, please  contact night-coverage [email or web address] Password [email or web address] 07/04/2013, 6:07 PM   LOS: 1 day

## 2013-07-05 DIAGNOSIS — E119 Type 2 diabetes mellitus without complications: Secondary | ICD-10-CM

## 2013-07-05 LAB — GLUCOSE, CAPILLARY
Glucose-Capillary: 301 mg/dL — ABNORMAL HIGH (ref 70–99)
Glucose-Capillary: 415 mg/dL — ABNORMAL HIGH (ref 70–99)
Glucose-Capillary: 148 mg/dL — ABNORMAL HIGH (ref 70–99)
Glucose-Capillary: 150 mg/dL — ABNORMAL HIGH (ref 70–99)

## 2013-07-05 LAB — MAGNESIUM: Magnesium: 2.2 mg/dL (ref 1.5–2.5)

## 2013-07-05 LAB — BASIC METABOLIC PANEL
BUN: 13 mg/dL (ref 6–23)
CO2: 24 mEq/L (ref 19–32)
Calcium: 8.4 mg/dL (ref 8.4–10.5)
Chloride: 100 mEq/L (ref 96–112)
Creatinine, Ser: 0.8 mg/dL (ref 0.50–1.35)
GFR calc Af Amer: >90 mL/min (ref >90)
GFR calc non Af Amer: 90 mL/min — ABNORMAL LOW (ref >90)
Glucose, Bld: 342 mg/dL — ABNORMAL HIGH (ref 70–99)
Potassium: 4.1 mEq/L (ref 3.5–5.1)
Sodium: 132 mEq/L — ABNORMAL LOW (ref 135–145)

## 2013-07-05 MED ORDER — INSULIN GLARGINE 100 UNIT/ML ~~LOC~~ SOLN
15.0000 [IU] | Freq: Every day | SUBCUTANEOUS | Status: DC
  Filled 2013-07-05: qty 0.15

## 2013-07-05 MED ORDER — INSULIN GLARGINE 100 UNIT/ML ~~LOC~~ SOLN: 15.0000 [IU] | Freq: Every day | SUBCUTANEOUS | Status: DC

## 2013-07-05 MED ORDER — INSULIN ASPART PROT & ASPART (70-30 MIX) 100 UNIT/ML ~~LOC~~ SUSP
10.0000 [IU] | Freq: Two times a day (BID) | SUBCUTANEOUS | Status: DC
  Administered 2013-07-05: 10 [IU] via SUBCUTANEOUS
  Filled 2013-07-05: qty 10

## 2013-07-05 NOTE — Progress Notes (Signed)
Blood glucose 415. Dr. Carles Collet notified.

## 2013-07-12 ENCOUNTER — Emergency Department (HOSPITAL_COMMUNITY)
Admission: EM | Admit: 2013-07-12 | Discharge: 2013-07-12 | Disposition: A | Discharge status: Home / Self Care | Payer: PRIVATE HEALTH INSURANCE | Attending: Emergency Medicine | Admitting: Emergency Medicine

## 2013-07-12 ENCOUNTER — Encounter (HOSPITAL_COMMUNITY): Payer: Self-pay

## 2013-07-12 DIAGNOSIS — Y939 Activity, unspecified: Secondary | ICD-10-CM | POA: Insufficient documentation

## 2013-07-12 DIAGNOSIS — E162 Hypoglycemia, unspecified: Secondary | ICD-10-CM

## 2013-07-12 DIAGNOSIS — Z79899 Other long term (current) drug therapy: Secondary | ICD-10-CM | POA: Insufficient documentation

## 2013-07-12 DIAGNOSIS — T38801A Poisoning by unspecified hormones and synthetic substitutes, accidental (unintentional), initial encounter: Secondary | ICD-10-CM | POA: Insufficient documentation

## 2013-07-12 DIAGNOSIS — I10 Essential (primary) hypertension: Secondary | ICD-10-CM | POA: Insufficient documentation

## 2013-07-12 DIAGNOSIS — Z794 Long term (current) use of insulin: Secondary | ICD-10-CM | POA: Insufficient documentation

## 2013-07-12 DIAGNOSIS — Z87891 Personal history of nicotine dependence: Secondary | ICD-10-CM | POA: Insufficient documentation

## 2013-07-12 DIAGNOSIS — Z8719 Personal history of other diseases of the digestive system: Secondary | ICD-10-CM | POA: Insufficient documentation

## 2013-07-12 DIAGNOSIS — Y929 Unspecified place or not applicable: Secondary | ICD-10-CM | POA: Insufficient documentation

## 2013-07-12 DIAGNOSIS — E1169 Type 2 diabetes mellitus with other specified complication: Secondary | ICD-10-CM | POA: Insufficient documentation

## 2013-07-12 LAB — GLUCOSE, CAPILLARY
Glucose-Capillary: 85 mg/dL (ref 70–99)
Glucose-Capillary: 124 mg/dL — ABNORMAL HIGH (ref 70–99)
Glucose-Capillary: 97 mg/dL (ref 70–99)
Glucose-Capillary: 159 mg/dL — ABNORMAL HIGH (ref 70–99)
Glucose-Capillary: 167 mg/dL — ABNORMAL HIGH (ref 70–99)

## 2013-07-12 LAB — POCT I-STAT, CHEM 8
BUN: 15 mg/dL (ref 6–23)
Calcium, Ion: 1.25 mmol/L (ref 1.13–1.30)
Chloride: 104 mEq/L (ref 96–112)
Creatinine, Ser: 1.5 mg/dL — ABNORMAL HIGH (ref 0.50–1.35)
Glucose, Bld: 116 mg/dL — ABNORMAL HIGH (ref 70–99)
HCT: 35 % — ABNORMAL LOW (ref 39.0–52.0)
Hemoglobin: 11.9 g/dL — ABNORMAL LOW (ref 13.0–17.0)
Potassium: 4.1 mEq/L (ref 3.5–5.1)
Sodium: 140 mEq/L (ref 135–145)
TCO2: 24 mmol/L (ref 0–100)

## 2013-07-12 MED ORDER — RAMIPRIL 10 MG PO CAPS
10.0000 mg | ORAL_CAPSULE | Freq: Every morning | ORAL | Status: DC
  Administered 2013-07-12: 10 mg via ORAL
  Filled 2013-07-12: qty 1

## 2013-07-12 MED ORDER — DEXTROSE 50 % IV SOLN: 25.0000 mL | Freq: Once | INTRAVENOUS | Status: DC

## 2013-07-12 NOTE — ED Notes (Signed)
Per EMS- Patient reports that he gave himself the wrong insulin this AM. CBG-22 upon arrival. Patient was given 1 mg glucagon. CBG- increased to 73 prior to arrival to the ED. Patient alert. Patient is hypertensive and has not taken his meds this AM

## 2013-07-12 NOTE — ED Provider Notes (Signed)
CSN: [id number]     Arrival date & time 07/12/13  1308 History   First MD Initiated Contact with Patient 07/12/13 1355     Chief Complaint  Patient presents with  . Hypoglycemia    HPI Pt was supposed to take his lantus last night.  He had his medications changed recently because of high blood sugar.  Last night he accidentally took 15 units of his novolog mix instead of his lantus. HIs last insulin doses were the lantus at 6:30 pm and then at 10 pm he took lantus again.  His brother checked on him this morning.  The patient was trying to eat some pie because he felt like his sugar was low.  His brother called 5.  CBG was 22.  EMS gave glucagon 1mg .  CBG is now 73. Patient denies any complaints and he feels fine now  Past Medical History  Diagnosis Date  . Diabetes mellitus without complication   . Hypertension   . Hernia   . Back pain    Past Surgical History  Procedure Laterality Date  . No past surgeries    . Hip arthroplasty Right 02/05/2013    Procedure: ARTHROPLASTY BIPOLAR HIP;  Surgeon: [name], MD;  Location: [facility] ORS;  Service: Orthopedics;  Laterality: Right;   History reviewed. No pertinent family history. History  Substance Use Topics  . Smoking status: Former Smoker -- 0.25 packs/day for 30 years    Types: Cigarettes    Quit date: 01/31/2013  . Smokeless tobacco: Never Used  . Alcohol Use: No    Review of Systems  All other systems reviewed and are negative.    Allergies  Review of patient's allergies indicates no known allergies.  Home Medications   Current Outpatient Rx  Name  Route  Sig  Dispense  Refill  . alprazolam (XANAX) 2 MG tablet   Oral   Take 2 mg by mouth 2 (two) times daily as needed for anxiety.          . cholecalciferol (VITAMIN D) 1000 UNITS tablet   Oral   Take 1,000 Units by mouth every morning.         . insulin aspart protamine- aspart (NOVOLOG MIX 70/30) (70-30) 100 UNIT/ML injection   Subcutaneous   Inject 9  Units into the skin 2 (two) times daily with a meal.         . insulin glargine (LANTUS) 100 UNIT/ML injection   Subcutaneous   Inject 15 Units into the skin at bedtime.         Marland Kitchen oxycodone (ROXICODONE) 30 MG immediate release tablet   Oral   Take 30 mg by mouth every 6 (six) hours as needed for pain.         . ramipril (ALTACE) 10 MG capsule   Oral   Take 10 mg by mouth every morning.          BP 141/69  Pulse 86  Temp(Src) 98.3 F (36.8 C) (Oral)  Resp 14  SpO2 97% Physical Exam  Nursing note and vitals reviewed. Constitutional: He appears well-developed and well-nourished. No distress.  Patient is eating a meal while I am evaluating him  HENT:  Head: Normocephalic and atraumatic.  Right Ear: External ear normal.  Left Ear: External ear normal.  Eyes: Conjunctivae are normal. Right eye exhibits no discharge. Left eye exhibits no discharge. No scleral icterus.  Neck: Neck supple. No tracheal deviation present.  Cardiovascular: Normal rate, regular rhythm and  intact distal pulses.   Pulmonary/Chest: Effort normal and breath sounds normal. No stridor. No respiratory distress. He has no wheezes. He has no rales.  Abdominal: Soft. Bowel sounds are normal. He exhibits no distension. There is no tenderness. There is no rebound and no guarding.  Musculoskeletal: He exhibits no edema and no tenderness.  Neurological: He is alert. He has normal strength. No sensory deficit. Cranial nerve deficit:  no gross defecits noted. He exhibits normal muscle tone. He displays no seizure activity. Coordination normal.  Skin: Skin is warm and dry. No rash noted.  Psychiatric: He has a normal mood and affect.    ED Course  Procedures (including critical care time) Labs Review Labs Reviewed  GLUCOSE, CAPILLARY - Abnormal; Notable for the following:    Glucose-Capillary 124 (*)    All other components within normal limits  POCT I-STAT, CHEM 8 - Abnormal; Notable for the following:     Creatinine, Ser 1.50 (*)    Glucose, Bld 116 (*)    Hemoglobin 11.9 (*)    HCT 35.0 (*)    All other components within normal limits  GLUCOSE, CAPILLARY   Imaging Review No results found.  MDM   1. Hypoglycemia    Pt with hypogycemia following a mix up in his medications.  He has responded to glucagon and food.  Will monitor in the ED for several hours.  If he remains stable, pt should be able to be discharged.    Kathalene Frames, MD 07/12/13 [phone]

## 2013-07-12 NOTE — ED Notes (Signed)
Bed: [id number] Expected date:  Expected time:  Means of arrival:  Comments: hypoglycemia

## 2013-10-04 ENCOUNTER — Encounter (INDEPENDENT_AMBULATORY_CARE_PROVIDER_SITE_OTHER): Payer: Self-pay | Admitting: Ophthalmology

## 2014-01-21 ENCOUNTER — Inpatient Hospital Stay (HOSPITAL_COMMUNITY): Payer: Medicare Other

## 2014-01-21 ENCOUNTER — Encounter (HOSPITAL_COMMUNITY): Payer: Self-pay | Admitting: Emergency Medicine

## 2014-01-21 ENCOUNTER — Emergency Department (HOSPITAL_COMMUNITY): Payer: Medicare Other

## 2014-01-21 ENCOUNTER — Inpatient Hospital Stay (HOSPITAL_COMMUNITY)
Admission: EM | Admit: 2014-01-21 | Discharge: 2014-01-25 | DRG: 638 | Discharge status: Against Medical Advice | Payer: Medicare Other | Attending: Internal Medicine | Admitting: Internal Medicine

## 2014-01-21 DIAGNOSIS — F329 Major depressive disorder, single episode, unspecified: Secondary | ICD-10-CM | POA: Diagnosis present

## 2014-01-21 DIAGNOSIS — D638 Anemia in other chronic diseases classified elsewhere: Secondary | ICD-10-CM | POA: Diagnosis present

## 2014-01-21 DIAGNOSIS — Z79899 Other long term (current) drug therapy: Secondary | ICD-10-CM

## 2014-01-21 DIAGNOSIS — I129 Hypertensive chronic kidney disease with stage 1 through stage 4 chronic kidney disease, or unspecified chronic kidney disease: Secondary | ICD-10-CM | POA: Diagnosis present

## 2014-01-21 DIAGNOSIS — F1010 Alcohol abuse, uncomplicated: Secondary | ICD-10-CM | POA: Diagnosis present

## 2014-01-21 DIAGNOSIS — Z91199 Patient's noncompliance with other medical treatment and regimen due to unspecified reason: Secondary | ICD-10-CM

## 2014-01-21 DIAGNOSIS — F3289 Other specified depressive episodes: Secondary | ICD-10-CM | POA: Diagnosis present

## 2014-01-21 DIAGNOSIS — M549 Dorsalgia, unspecified: Secondary | ICD-10-CM | POA: Diagnosis present

## 2014-01-21 DIAGNOSIS — E119 Type 2 diabetes mellitus without complications: Secondary | ICD-10-CM

## 2014-01-21 DIAGNOSIS — F32A Depression, unspecified: Secondary | ICD-10-CM

## 2014-01-21 DIAGNOSIS — E872 Acidosis, unspecified: Secondary | ICD-10-CM | POA: Diagnosis present

## 2014-01-21 DIAGNOSIS — Z794 Long term (current) use of insulin: Secondary | ICD-10-CM

## 2014-01-21 DIAGNOSIS — Z9119 Patient's noncompliance with other medical treatment and regimen: Secondary | ICD-10-CM

## 2014-01-21 DIAGNOSIS — N182 Chronic kidney disease, stage 2 (mild): Secondary | ICD-10-CM

## 2014-01-21 DIAGNOSIS — E162 Hypoglycemia, unspecified: Secondary | ICD-10-CM

## 2014-01-21 DIAGNOSIS — G8929 Other chronic pain: Secondary | ICD-10-CM | POA: Diagnosis present

## 2014-01-21 DIAGNOSIS — I10 Essential (primary) hypertension: Secondary | ICD-10-CM

## 2014-01-21 DIAGNOSIS — Z87891 Personal history of nicotine dependence: Secondary | ICD-10-CM

## 2014-01-21 DIAGNOSIS — E1169 Type 2 diabetes mellitus with other specified complication: Principal | ICD-10-CM | POA: Diagnosis present

## 2014-01-21 DIAGNOSIS — R569 Unspecified convulsions: Secondary | ICD-10-CM | POA: Diagnosis present

## 2014-01-21 DIAGNOSIS — F411 Generalized anxiety disorder: Secondary | ICD-10-CM | POA: Diagnosis present

## 2014-01-21 DIAGNOSIS — Z96649 Presence of unspecified artificial hip joint: Secondary | ICD-10-CM

## 2014-01-21 DIAGNOSIS — E876 Hypokalemia: Secondary | ICD-10-CM | POA: Diagnosis present

## 2014-01-21 LAB — RAPID URINE DRUG SCREEN, HOSP PERFORMED
Amphetamines: NOT DETECTED
Barbiturates: NOT DETECTED
Benzodiazepines: NOT DETECTED
Cocaine: NOT DETECTED
Opiates: POSITIVE — AB
Tetrahydrocannabinol: NOT DETECTED

## 2014-01-21 LAB — CREATININE, SERUM
Creatinine, Ser: 0.97 mg/dL (ref 0.50–1.35)
GFR calc Af Amer: >90 mL/min (ref >90)
GFR calc non Af Amer: 83 mL/min — ABNORMAL LOW (ref >90)

## 2014-01-21 LAB — I-STAT TROPONIN, ED: Troponin i, poc: 0 ng/mL (ref 0.00–0.08)

## 2014-01-21 LAB — HEPATIC FUNCTION PANEL
ALT: 12 U/L (ref 0–53)
AST: 20 U/L (ref 0–37)
Albumin: 3.5 g/dL (ref 3.5–5.2)
Alkaline Phosphatase: 68 U/L (ref 39–117)
Bilirubin, Direct: <0.2 mg/dL (ref 0.0–0.3)
Total Bilirubin: 0.6 mg/dL (ref 0.3–1.2)
Total Protein: 7 g/dL (ref 6.0–8.3)

## 2014-01-21 LAB — CBG MONITORING, ED
Glucose-Capillary: 82 mg/dL (ref 70–99)
Glucose-Capillary: 83 mg/dL (ref 70–99)
Glucose-Capillary: 146 mg/dL — ABNORMAL HIGH (ref 70–99)
Glucose-Capillary: 45 mg/dL — ABNORMAL LOW (ref 70–99)
Glucose-Capillary: 42 mg/dL — CL (ref 70–99)
Glucose-Capillary: 109 mg/dL — ABNORMAL HIGH (ref 70–99)
Glucose-Capillary: 35 mg/dL — CL (ref 70–99)
Glucose-Capillary: 114 mg/dL — ABNORMAL HIGH (ref 70–99)
Glucose-Capillary: 76 mg/dL (ref 70–99)
Glucose-Capillary: 90 mg/dL (ref 70–99)

## 2014-01-21 LAB — URINALYSIS, ROUTINE W REFLEX MICROSCOPIC
Bilirubin Urine: NEGATIVE
Glucose, UA: 250 mg/dL — AB
Hgb urine dipstick: NEGATIVE
Ketones, ur: NEGATIVE mg/dL
Leukocytes, UA: NEGATIVE
Nitrite: NEGATIVE
Protein, ur: 100 mg/dL — AB
Specific Gravity, Urine: 1.025 (ref 1.005–1.030)
Urobilinogen, UA: 0.2 mg/dL (ref 0.0–1.0)
pH: 6 (ref 5.0–8.0)

## 2014-01-21 LAB — BASIC METABOLIC PANEL
BUN: 15 mg/dL (ref 6–23)
BUN: 14 mg/dL (ref 6–23)
CO2: 21 mEq/L (ref 19–32)
CO2: 20 mEq/L (ref 19–32)
Calcium: 9 mg/dL (ref 8.4–10.5)
Calcium: 8.7 mg/dL (ref 8.4–10.5)
Chloride: 104 mEq/L (ref 96–112)
Chloride: 105 mEq/L (ref 96–112)
Creatinine, Ser: 0.95 mg/dL (ref 0.50–1.35)
Creatinine, Ser: 0.93 mg/dL (ref 0.50–1.35)
GFR calc Af Amer: >90 mL/min (ref >90)
GFR calc Af Amer: >90 mL/min (ref >90)
GFR calc non Af Amer: 84 mL/min — ABNORMAL LOW (ref >90)
GFR calc non Af Amer: 84 mL/min — ABNORMAL LOW (ref >90)
Glucose, Bld: 119 mg/dL — ABNORMAL HIGH (ref 70–99)
Glucose, Bld: 96 mg/dL (ref 70–99)
Potassium: 4.1 mEq/L (ref 3.7–5.3)
Potassium: 3.7 mEq/L (ref 3.7–5.3)
Sodium: 141 mEq/L (ref 137–147)
Sodium: 140 mEq/L (ref 137–147)

## 2014-01-21 LAB — CBC WITH DIFFERENTIAL/PLATELET
Basophils Absolute: 0 10*3/uL (ref 0.0–0.1)
Basophils Relative: 0 % (ref 0–1)
Eosinophils Absolute: 0 10*3/uL (ref 0.0–0.7)
Eosinophils Relative: 0 % (ref 0–5)
HCT: 40.9 % (ref 39.0–52.0)
Hemoglobin: 13.9 g/dL (ref 13.0–17.0)
Lymphocytes Relative: 13 % (ref 12–46)
Lymphs Abs: 1.1 10*3/uL (ref 0.7–4.0)
MCH: 30.2 pg (ref 26.0–34.0)
MCHC: 34 g/dL (ref 30.0–36.0)
MCV: 88.7 fL (ref 78.0–100.0)
Monocytes Absolute: 0.6 10*3/uL (ref 0.1–1.0)
Monocytes Relative: 7 % (ref 3–12)
Neutro Abs: 7.2 10*3/uL (ref 1.7–7.7)
Neutrophils Relative %: 80 % — ABNORMAL HIGH (ref 43–77)
Platelets: 247 10*3/uL (ref 150–400)
RBC: 4.61 MIL/uL (ref 4.22–5.81)
RDW: 13.4 % (ref 11.5–15.5)
WBC: 9 10*3/uL (ref 4.0–10.5)

## 2014-01-21 LAB — GLUCOSE, CAPILLARY
Glucose-Capillary: 143 mg/dL — ABNORMAL HIGH (ref 70–99)
Glucose-Capillary: 252 mg/dL — ABNORMAL HIGH (ref 70–99)
Glucose-Capillary: 275 mg/dL — ABNORMAL HIGH (ref 70–99)
Glucose-Capillary: 269 mg/dL — ABNORMAL HIGH (ref 70–99)
Glucose-Capillary: 346 mg/dL — ABNORMAL HIGH (ref 70–99)

## 2014-01-21 LAB — URINE MICROSCOPIC-ADD ON

## 2014-01-21 LAB — CBC
HCT: 36.6 % — ABNORMAL LOW (ref 39.0–52.0)
Hemoglobin: 12.5 g/dL — ABNORMAL LOW (ref 13.0–17.0)
MCH: 30.6 pg (ref 26.0–34.0)
MCHC: 34.2 g/dL (ref 30.0–36.0)
MCV: 89.5 fL (ref 78.0–100.0)
Platelets: 198 10*3/uL (ref 150–400)
RBC: 4.09 MIL/uL — ABNORMAL LOW (ref 4.22–5.81)
RDW: 13.4 % (ref 11.5–15.5)
WBC: 9.6 10*3/uL (ref 4.0–10.5)

## 2014-01-21 LAB — ETHANOL: Alcohol, Ethyl (B): <11 mg/dL (ref 0–11)

## 2014-01-21 LAB — I-STAT CHEM 8, ED
BUN: 13 mg/dL (ref 6–23)
Calcium, Ion: 1.14 mmol/L (ref 1.13–1.30)
Chloride: 106 mEq/L (ref 96–112)
Creatinine, Ser: 1.1 mg/dL (ref 0.50–1.35)
Glucose, Bld: 24 mg/dL — CL (ref 70–99)
HCT: 44 % (ref 39.0–52.0)
Hemoglobin: 15 g/dL (ref 13.0–17.0)
Potassium: 3 mEq/L — ABNORMAL LOW (ref 3.7–5.3)
Sodium: 142 mEq/L (ref 137–147)
TCO2: 20 mmol/L (ref 0–100)

## 2014-01-21 LAB — LACTIC ACID, PLASMA: Lactic Acid, Venous: 1.3 mmol/L (ref 0.5–2.2)

## 2014-01-21 LAB — PROTIME-INR
INR: 1.02 (ref 0.00–1.49)
Prothrombin Time: 13.2 seconds (ref 11.6–15.2)

## 2014-01-21 LAB — I-STAT CG4 LACTIC ACID, ED: Lactic Acid, Venous: 7.28 mmol/L — ABNORMAL HIGH (ref 0.5–2.2)

## 2014-01-21 LAB — APTT: aPTT: 20 seconds — ABNORMAL LOW (ref 24–37)

## 2014-01-21 LAB — MRSA PCR SCREENING: MRSA by PCR: NEGATIVE

## 2014-01-21 LAB — MAGNESIUM: Magnesium: 2.3 mg/dL (ref 1.5–2.5)

## 2014-01-21 LAB — CORTISOL: Cortisol, Plasma: 21.4 ug/dL

## 2014-01-21 LAB — TSH: TSH: 0.363 u[IU]/mL (ref 0.350–4.500)

## 2014-01-21 MED ORDER — INSULIN ASPART 100 UNIT/ML ~~LOC~~ SOLN: 0.0000 [IU] | Freq: Three times a day (TID) | SUBCUTANEOUS | Status: DC

## 2014-01-21 MED ORDER — ENOXAPARIN SODIUM 40 MG/0.4ML ~~LOC~~ SOLN
40.0000 mg | SUBCUTANEOUS | Status: DC
  Administered 2014-01-21 – 2014-01-22 (×2): 40 mg via SUBCUTANEOUS
  Filled 2014-01-21 (×3): qty 0.4

## 2014-01-21 MED ORDER — LORAZEPAM 1 MG PO TABS: 1.0000 mg | ORAL_TABLET | ORAL | Status: DC | PRN

## 2014-01-21 MED ORDER — DEXTROSE 50 % IV SOLN
INTRAVENOUS | Status: AC
  Filled 2014-01-21: qty 50

## 2014-01-21 MED ORDER — DEXTROSE-NACL 5-0.9 % IV SOLN
INTRAVENOUS | Status: DC
  Filled 2014-01-21 (×2): qty 1000

## 2014-01-21 MED ORDER — OXYCODONE HCL 5 MG PO TABS
30.0000 mg | ORAL_TABLET | Freq: Four times a day (QID) | ORAL | Status: DC | PRN
  Administered 2014-01-21 – 2014-01-25 (×15): 30 mg via ORAL
  Filled 2014-01-21 (×15): qty 6

## 2014-01-21 MED ORDER — VITAMIN B-1 100 MG PO TABS
100.0000 mg | ORAL_TABLET | Freq: Every day | ORAL | Status: DC
  Administered 2014-01-21 – 2014-01-24 (×4): 100 mg via ORAL
  Filled 2014-01-21 (×5): qty 1

## 2014-01-21 MED ORDER — ACETAMINOPHEN 325 MG PO TABS
650.0000 mg | ORAL_TABLET | Freq: Four times a day (QID) | ORAL | Status: DC | PRN
  Administered 2014-01-23 (×2): 650 mg via ORAL
  Filled 2014-01-21 (×3): qty 2

## 2014-01-21 MED ORDER — DEXTROSE 50 % IV SOLN
50.0000 mL | Freq: Once | INTRAVENOUS | Status: AC
  Administered 2014-01-21: 50 mL via INTRAVENOUS
  Filled 2014-01-21: qty 50

## 2014-01-21 MED ORDER — RAMIPRIL 10 MG PO CAPS
10.0000 mg | ORAL_CAPSULE | Freq: Every morning | ORAL | Status: DC
  Administered 2014-01-22 – 2014-01-24 (×3): 10 mg via ORAL
  Filled 2014-01-21 (×4): qty 1

## 2014-01-21 MED ORDER — DEXTROSE 5 % IV SOLN: INTRAVENOUS | Status: DC

## 2014-01-21 MED ORDER — DEXTROSE-NACL 5-0.9 % IV SOLN
INTRAVENOUS | Status: DC
  Filled 2014-01-21 (×3): qty 1000

## 2014-01-21 MED ORDER — SODIUM CHLORIDE 0.9 % IJ SOLN
3.0000 mL | Freq: Two times a day (BID) | INTRAMUSCULAR | Status: DC
  Administered 2014-01-22 – 2014-01-24 (×2): 3 mL via INTRAVENOUS

## 2014-01-21 MED ORDER — INSULIN ASPART 100 UNIT/ML ~~LOC~~ SOLN
5.0000 [IU] | Freq: Once | SUBCUTANEOUS | Status: AC
  Administered 2014-01-21: 5 [IU] via SUBCUTANEOUS

## 2014-01-21 MED ORDER — LORAZEPAM 2 MG/ML IJ SOLN
2.0000 mg | Freq: Once | INTRAMUSCULAR | Status: AC
  Administered 2014-01-21: 2 mg via INTRAVENOUS

## 2014-01-21 MED ORDER — ONDANSETRON HCL 4 MG PO TABS: 4.0000 mg | ORAL_TABLET | Freq: Four times a day (QID) | ORAL | Status: DC | PRN

## 2014-01-21 MED ORDER — FOLIC ACID 1 MG PO TABS
1.0000 mg | ORAL_TABLET | Freq: Every day | ORAL | Status: DC
Start: 2014-01-21 — End: 2014-01-25
  Administered 2014-01-21 – 2014-01-24 (×4): 1 mg via ORAL
  Filled 2014-01-21 (×5): qty 1

## 2014-01-21 MED ORDER — POLYETHYLENE GLYCOL 3350 17 G PO PACK
17.0000 g | PACK | Freq: Every day | ORAL | Status: DC | PRN
  Filled 2014-01-21: qty 1

## 2014-01-21 MED ORDER — DOCUSATE SODIUM 100 MG PO CAPS
100.0000 mg | ORAL_CAPSULE | Freq: Two times a day (BID) | ORAL | Status: DC
  Administered 2014-01-21 – 2014-01-24 (×3): 100 mg via ORAL
  Filled 2014-01-21 (×2): qty 1

## 2014-01-21 MED ORDER — DEXTROSE 50 % IV SOLN
50.0000 mL | Freq: Once | INTRAVENOUS | Status: AC
  Administered 2014-01-21: 50 mL via INTRAVENOUS

## 2014-01-21 MED ORDER — IPRATROPIUM BROMIDE 0.02 % IN SOLN: 0.5000 mg | RESPIRATORY_TRACT | Status: DC | PRN

## 2014-01-21 MED ORDER — DEXTROSE 50 % IV SOLN
25.0000 mL | Freq: Once | INTRAVENOUS | Status: AC
  Administered 2014-01-21: 50 mL via INTRAVENOUS

## 2014-01-21 MED ORDER — DEXTROSE 50 % IV SOLN
1.0000 | Freq: Once | INTRAVENOUS | Status: AC
  Administered 2014-01-21: 50 mL via INTRAVENOUS
  Filled 2014-01-21: qty 50

## 2014-01-21 MED ORDER — SODIUM CHLORIDE 0.9 % IV SOLN
Freq: Once | INTRAVENOUS | Status: AC
  Administered 2014-01-21: 06:00:00 via INTRAVENOUS

## 2014-01-21 MED ORDER — SODIUM CHLORIDE 0.9 % IV SOLN
INTRAVENOUS | Status: DC
  Administered 2014-01-21 – 2014-01-22 (×2): via INTRAVENOUS

## 2014-01-21 MED ORDER — ALUM & MAG HYDROXIDE-SIMETH 200-200-20 MG/5ML PO SUSP
30.0000 mL | Freq: Four times a day (QID) | ORAL | Status: DC | PRN
Start: 2014-01-21 — End: 2014-01-25

## 2014-01-21 MED ORDER — ADULT MULTIVITAMIN W/MINERALS CH
1.0000 | ORAL_TABLET | Freq: Every day | ORAL | Status: DC
  Administered 2014-01-21 – 2014-01-24 (×4): 1 via ORAL
  Filled 2014-01-21 (×5): qty 1

## 2014-01-21 MED ORDER — MAGNESIUM CITRATE PO SOLN
1.0000 | Freq: Once | ORAL | Status: AC | PRN
  Filled 2014-01-21: qty 296

## 2014-01-21 MED ORDER — PANTOPRAZOLE SODIUM 40 MG PO TBEC
40.0000 mg | DELAYED_RELEASE_TABLET | Freq: Every day | ORAL | Status: DC
  Administered 2014-01-22 – 2014-01-25 (×4): 40 mg via ORAL
  Filled 2014-01-21 (×3): qty 1

## 2014-01-21 MED ORDER — ALBUTEROL SULFATE (2.5 MG/3ML) 0.083% IN NEBU: 2.5000 mg | INHALATION_SOLUTION | RESPIRATORY_TRACT | Status: DC | PRN

## 2014-01-21 MED ORDER — ACETAMINOPHEN 650 MG RE SUPP: 650.0000 mg | Freq: Four times a day (QID) | RECTAL | Status: DC | PRN

## 2014-01-21 MED ORDER — ALPRAZOLAM 0.5 MG PO TABS: 2.0000 mg | ORAL_TABLET | Freq: Two times a day (BID) | ORAL | Status: DC | PRN

## 2014-01-21 MED ORDER — SORBITOL 70 % SOLN
30.0000 mL | Freq: Every day | Status: DC | PRN
  Filled 2014-01-21: qty 30

## 2014-01-21 MED ORDER — ONDANSETRON HCL 4 MG/2ML IJ SOLN: 4.0000 mg | Freq: Four times a day (QID) | INTRAMUSCULAR | Status: DC | PRN

## 2014-01-21 NOTE — Procedures (Signed)
ELECTROENCEPHALOGRAM REPORT   Patient: [name] [name]       Room #: D36 EEG No. ID: [id number] Age: 68 y.o.        Sex: male Referring Physician: Grandville Silos Report Date:  01/21/2014        Interpreting Physician: Alexis Goodell  History: [name] [name] is an 68 y.o. male with first presentation for seizure  Medications:  Scheduled: . dextrose        Conditions of Recording:  This is a 16 channel EEG carried out with the patient in the awake, drowsy and asleep states.  Description:  The waking background activity consists of a low voltage, symmetrical, fairly well organized but poorly sustained, 9 Hz alpha activity, seen from the parieto-occipital and posterior temporal regions.  Low voltage fast activity, poorly organized, is seen anteriorly and is at times superimposed on more posterior regions.  A mixture of theta and alpha rhythms are seen from the central and temporal regions. The patient drowses with slowing to irregular, low voltage theta and beta activity.   The patient goes in to a light sleep with symmetrical sleep spindles, vertex central sharp transients and irregular slow activity.   Hyperventilation produced a mild to moderate buildup but failed to elicit any abnormalities.  Intermittent photic stimulation was performed but failed to illicit any change in the tracing.    IMPRESSION: Normal electroencephalogram, awake, asleep and with activation procedures. There are no focal lateralizing or epileptiform features.   Alexis Goodell, MD [facility] [phone] 01/21/2014, 12:57 PM

## 2014-01-21 NOTE — ED Notes (Signed)
Family at bedside. 

## 2014-01-21 NOTE — ED Notes (Signed)
Pt. Coming from home. This is EMS's second call to home. First call: seizure, but alert and oriented - refused to come here. cbg low and drank 2 soda's Second Call:   According to family, "pt. Not eating well. Family gave insulin 10 units. because sugar was high (190 - 200's). Pt. Drank whisky tonight.

## 2014-01-21 NOTE — ED Notes (Signed)
Dr Grandville Silos ordered to change bed to step down due to cbg dropping every hour and need for frequent cbg checks

## 2014-01-21 NOTE — ED Notes (Addendum)
CBG reads 42. Valene Bors RN and Dr.Otter made aware

## 2014-01-21 NOTE — ED Provider Notes (Signed)
CSN: [id number]     Arrival date & time 01/21/14  0501 History   First MD Initiated Contact with Patient 01/21/14 [phone]     Chief Complaint  Patient presents with  . Hypoglycemia     (Consider location/radiation/quality/duration/timing/severity/associated sxs/prior Treatment) HPI 68 yo male presents to the ER from home via EMS with altered blood sugars and possible seizure.  Pt with seizure upon arrival, incontinence of urine.  EMS reports they had been called out to house x 2 tonight.  Initially called around 11 pm for hand shaking and high then low sugars through the day, pt was intoxicated, belligerent, and refused treatment.  Family called again around 4 am due to meter reading high, and they gave 10 units of novolog.  Pt has history of dm, htn, alcohol abuse.  Pt also has history of poor compliance with insulin regimen.  Family is unsure if or when he has taken his lantus or novolog.  EMS reports just prior to arrival to the ER pt became stiff, unresponsive and combative.  Blood sugar for EMS 135 Past Medical History  Diagnosis Date  . Diabetes mellitus without complication   . Hypertension   . Hernia   . Back pain    Past Surgical History  Procedure Laterality Date  . No past surgeries    . Hip arthroplasty Right 02/05/2013    Procedure: ARTHROPLASTY BIPOLAR HIP;  Surgeon: [name], MD;  Location: [facility];  Service: Orthopedics;  Laterality: Right;   History reviewed. No pertinent family history. History  Substance Use Topics  . Smoking status: Former Smoker -- 0.25 packs/day for 30 years    Types: Cigarettes    Quit date: 01/31/2013  . Smokeless tobacco: Never Used  . Alcohol Use: Yes    Review of Systems  Unable to perform ROS: Acuity of condition      Allergies  Review of patient's allergies indicates no known allergies.  Home Medications   Current Outpatient Rx  Name  Route  Sig  Dispense  Refill  . alprazolam (XANAX) 2 MG tablet   Oral   Take 2 mg by  mouth 2 (two) times daily as needed for anxiety.          . cholecalciferol (VITAMIN D) 1000 UNITS tablet   Oral   Take 1,000 Units by mouth every morning.         . insulin aspart protamine- aspart (NOVOLOG MIX 70/30) (70-30) 100 UNIT/ML injection   Subcutaneous   Inject 9 Units into the skin 2 (two) times daily with a meal.         . insulin glargine (LANTUS) 100 UNIT/ML injection   Subcutaneous   Inject 15 Units into the skin at bedtime.         Marland Kitchen oxycodone (ROXICODONE) 30 MG immediate release tablet   Oral   Take 30 mg by mouth every 6 (six) hours as needed for pain.         . ramipril (ALTACE) 10 MG capsule   Oral   Take 10 mg by mouth every morning.          BP 159/81  Pulse 63  Resp 28  SpO2 99% Physical Exam  Nursing note and vitals reviewed. Constitutional: He appears distressed.  Pt unresponsive, making groaning gutteral noises, facial tics, and tonic activity of arms, legs, diaphoretic  HENT:  Head: Normocephalic and atraumatic.  Cardiovascular: Normal rate, regular rhythm, normal heart sounds and intact distal pulses.  Exam  reveals no gallop and no friction rub.   No murmur heard. Pulmonary/Chest: Effort normal and breath sounds normal. No respiratory distress. He has no wheezes. He has no rales. He exhibits no tenderness.  Abdominal: Soft. Bowel sounds are normal. He exhibits no distension and no mass. There is no tenderness. There is no rebound and no guarding.  Musculoskeletal: He exhibits no edema.  Neurological:  Unresponsive, tonic activity  Skin: Skin is warm. No rash noted. He is diaphoretic. No erythema. No pallor.    ED Course  Procedures (including critical care time) CRITICAL CARE Performed by: Kalman Drape Total critical care time: 60 min Critical care time was exclusive of separately billable procedures and treating other patients. Critical care was necessary to treat or prevent imminent or life-threatening  deterioration. Critical care was time spent personally by me on the following activities: development of treatment plan with patient and/or surrogate as well as nursing, discussions with consultants, evaluation of patient's response to treatment, examination of patient, obtaining history from patient or surrogate, ordering and performing treatments and interventions, ordering and review of laboratory studies, ordering and review of radiographic studies, pulse oximetry and re-evaluation of patient's condition.  Labs Review Labs Reviewed  CBC WITH DIFFERENTIAL - Abnormal; Notable for the following:    Neutrophils Relative % 80 (*)    All other components within normal limits  I-STAT CHEM 8, ED - Abnormal; Notable for the following:    Potassium 3.0 (*)    Glucose, Bld 24 (*)    All other components within normal limits  I-STAT CG4 LACTIC ACID, ED - Abnormal; Notable for the following:    Lactic Acid, Venous 7.28 (*)    All other components within normal limits  CBG MONITORING, ED - Abnormal; Notable for the following:    Glucose-Capillary 45 (*)    All other components within normal limits  CBG MONITORING, ED - Abnormal; Notable for the following:    Glucose-Capillary 146 (*)    All other components within normal limits  ETHANOL  BASIC METABOLIC PANEL  I-STAT TROPOININ, ED  CBG MONITORING, ED  CBG MONITORING, ED   Imaging Review No results found.   EKG Interpretation   Date/Time:  Saturday January 21 2014 05:16:36 EDT Ventricular Rate:  85 PR Interval:  167 QRS Duration: 97 QT Interval:  376 QTC Calculation: 447 R Axis:   -48 Text Interpretation:  Sinus rhythm Consider right atrial enlargement LAD,  consider left anterior fascicular block ST elev, probable normal early  repol pattern No significant change since last tracing Confirmed by Karam Dunson   MD, Yedidya Duddy ([id number]) on 01/21/2014 7:54:59 AM      MDM   Final diagnoses:  Hypoglycemia  Lactic acidosis  Seizure    68 yo male  with seizure upon arrival, blood sugar for EMS 135, dropped to 83 upon arrival, 1/2 amp d50 given 2 mg of ativan.  No prior h/o seizure disorder, but per family has had problems with low sugars in the past.  Pt recovered from seizure but appears to have some post ictal residual.  Pt is poor historian, is unsure what insulin he has been giving himself.  Lactate significantly elevated, most likely due to seizure.  Pt has had juice with recurrent hypoglycemia.  Will d/w hospitalist for admission.    Kalman Drape, MD 01/21/14 0800

## 2014-01-21 NOTE — Consult Note (Signed)
Reason for Consult: Seizure Referring Physician: Grandville Silos  CC: Seizure  HPI: [name] [name] is an 68 y.o. male who is amnestic of most of the events of the morning.  From the chart it seems that EMS was called out to patient's house 2 times on the night prior to admission. Initially was called around 11 PM as was noted that patient was having hand shaking and then high and low blood sugars throughout the day. Patient was noted to be intoxicated, belligerent and refused treatment at that time. Patient states he only took 2 drinks of couvoirser early on that evening.  EMS was subsequently called again around 4 AM due to a high blood sugar and patient was given 10 units of NovoLog. Per ED physician family was unsure if patient took his Lantus. EMS reports just prior to arrival patient was noted to become stiff unresponsive and combative with a blood sugar at that time of 135. On arrival to the emergency room patient was noted to have another seizure and at that time the CBG had dropped to 83 patient was given half an amp of D50 and 2 mg of Ativan. Per ED physician Chem-7 which was drawn upon arrival at a glucose level of 24. Patient did not have any tongue biting or urinary incontinence. Patient currently now back to baseline.     Past Medical History  Diagnosis Date  . Diabetes mellitus without complication   . Hypertension   . Hernia   . Back pain     Past Surgical History  Procedure Laterality Date  . No past surgeries    . Hip arthroplasty Right 02/05/2013    Procedure: ARTHROPLASTY BIPOLAR HIP;  Surgeon: [name], MD;  Location: [facility];  Service: Orthopedics;  Laterality: Right;    Family history: Father died of prostate cancer.  Mother still living with Alzheimer's.  Sister s/p mastectomy from breast cancer.    Social History:  reports that he quit smoking about a year ago. His smoking use included Cigarettes. He has a 7.5 pack-year smoking history. He has never used smokeless  tobacco. He reports that he drinks alcohol but only on rare occasions.  He reports that he does not use illicit drugs.  No Known Allergies  Medications: I have reviewed the patient's current medications. Prior to Admission:  Current outpatient prescriptions:alprazolam (XANAX) 2 MG tablet, Take 2 mg by mouth 2 (two) times daily as needed for anxiety. , Disp: , Rfl: ;  FLUoxetine (PROZAC) 20 MG capsule, Take 20 mg by mouth daily., Disp: , Rfl: ;  insulin aspart protamine- aspart (NOVOLOG MIX 70/30) (70-30) 100 UNIT/ML injection, Inject 15 Units into the skin 2 (two) times daily with a meal. , Disp: , Rfl:  insulin glargine (LANTUS) 100 UNIT/ML injection, Inject 15 Units into the skin at bedtime., Disp: , Rfl: ;  oxycodone (ROXICODONE) 30 MG immediate release tablet, Take 30 mg by mouth every 4 (four) hours as needed for pain. , Disp: , Rfl: ;  ramipril (ALTACE) 10 MG capsule, Take 10 mg by mouth every morning., Disp: , Rfl:  Vitamin D, Ergocalciferol, (DRISDOL) 50000 UNITS CAPS capsule, Take 50,000 Units by mouth every 7 (seven) days. Thursday, Disp: , Rfl:   ROS: History obtained from the patient  General ROS: negative for - chills, fatigue, fever, night sweats, weight gain or weight loss Psychological ROS: negative for - behavioral disorder, hallucinations, memory difficulties, mood swings or suicidal ideation Ophthalmic ROS: negative for - blurry vision, double vision,  eye pain or loss of vision ENT ROS: negative for - epistaxis, nasal discharge, oral lesions, sore throat, tinnitus or vertigo Allergy and Immunology ROS: negative for - hives or itchy/watery eyes Hematological and Lymphatic ROS: negative for - bleeding problems, bruising or swollen lymph nodes Endocrine ROS: negative for - galactorrhea, hair pattern changes, polydipsia/polyuria or temperature intolerance Respiratory ROS: negative for - cough, hemoptysis, shortness of breath or wheezing Cardiovascular ROS: negative for - chest  pain, dyspnea on exertion, edema or irregular heartbeat Gastrointestinal ROS: negative for - abdominal pain, diarrhea, hematemesis, nausea/vomiting or stool incontinence Genito-Urinary ROS: negative for - dysuria, hematuria, incontinence or urinary frequency/urgency Musculoskeletal ROS: hip pain Neurological ROS: as noted in HPI Dermatological ROS: negative for rash and skin lesion changes  Physical Examination: Blood pressure 148/85, pulse 77, temperature 99.1 F (37.3 C), temperature source Oral, resp. rate 23, SpO2 96.00%.  Neurologic Examination Mental Status: Alert, oriented, thought content appropriate.  Speech fluent without evidence of aphasia.  Able to follow 3 step commands without difficulty. Cranial Nerves: II: Discs flat bilaterally; Visual fields grossly normal, pupils equal, round, reactive to light and accommodation III,IV, VI: ptosis not present, extra-ocular motions intact bilaterally V,VII: smile symmetric, facial light touch sensation normal bilaterally VIII: hearing normal bilaterally IX,X: gag reflex present XI: bilateral shoulder shrug XII: midline tongue extension Motor: Right : Upper extremity   5/5    Left:     Upper extremity   5/5  Lower extremity   5/5     Lower extremity   5/5 Tone and bulk:normal tone throughout; no atrophy noted Sensory: Pinprick and light touch intact throughout, bilaterally Deep Tendon Reflexes: 2+ in the upper extremities, 1+ at the knees and absent at the ankles Plantars: Right: downgoing   Left: downgoing Cerebellar: normal finger-to-nose and normal heel-to-shin test Gait: Unable to test CV: pulses palpable throughout    Laboratory Studies:   Basic Metabolic Panel:  Recent Labs Lab 01/21/14 0534 01/21/14 0845 01/21/14 0955  NA 142 141  --   K 3.0* 4.1  --   CL 106 104  --   CO2  --  21  --   GLUCOSE 24* 119*  --   BUN 13 15  --   CREATININE 1.10 0.95  --   CALCIUM  --  9.0  --   MG  --   --  2.3    Liver  Function Tests:  Recent Labs Lab 01/21/14 0955  AST 20  ALT 12  ALKPHOS 68  BILITOT 0.6  PROT 7.0  ALBUMIN 3.5   No results found for this basename: LIPASE, AMYLASE,  in the last 168 hours No results found for this basename: AMMONIA,  in the last 168 hours  CBC:  Recent Labs Lab 01/21/14 0515 01/21/14 0534  WBC 9.0  --   NEUTROABS 7.2  --   HGB 13.9 15.0  HCT 40.9 44.0  MCV 88.7  --   PLT 247  --     Cardiac Enzymes: No results found for this basename: CKTOTAL, CKMB, CKMBINDEX, TROPONINI,  in the last 168 hours  BNP: No components found with this basename: POCBNP,   CBG:  Recent Labs Lab 01/21/14 0704 01/21/14 0814 01/21/14 0842 01/21/14 0953 01/21/14 1055  GLUCAP 146* 42* 109* 35* 114*    Microbiology: Results for orders placed during the hospital encounter of 05/29/13  MRSA PCR SCREENING     Status: None   Collection Time    05/29/13 11:25 PM  Result Value Ref Range Status   MRSA by PCR NEGATIVE  NEGATIVE Final   Comment:            The GeneXpert MRSA Assay (FDA     approved for NASAL specimens     only), is one component of a     comprehensive MRSA colonization     surveillance program. It is not     intended to diagnose MRSA     infection nor to guide or     monitor treatment for     MRSA infections.     Performed at [place], BLOOD (ROUTINE X 2)     Status: None   Collection Time    05/29/13 11:45 PM      Result Value Ref Range Status   Specimen Description BLOOD RIGHT ARM   Final   Special Requests BOTTLES DRAWN AEROBIC AND ANAEROBIC  7ML   Final   Culture  Setup Time     Final   Value: 05/30/2013 09:03     Performed at [facility]   Culture     Final   Value: NO GROWTH 5 DAYS     Performed at [facility]   Report Status 06/05/2013 FINAL   Final  CULTURE, BLOOD (ROUTINE X 2)     Status: None   Collection Time    05/29/13 11:50 PM      Result Value Ref Range Status   Specimen Description  BLOOD RIGHT HAND   Final   Special Requests BOTTLES DRAWN AEROBIC AND ANAEROBIC  9ML   Final   Culture  Setup Time     Final   Value: 05/30/2013 09:02     Performed at [facility]   Culture     Final   Value: NO GROWTH 5 DAYS     Performed at [facility]   Report Status 06/05/2013 FINAL   Final    Coagulation Studies: No results found for this basename: LABPROT, INR,  in the last 72 hours  Urinalysis: No results found for this basename: COLORURINE, APPERANCEUR, LABSPEC, PHURINE, GLUCOSEU, HGBUR, BILIRUBINUR, KETONESUR, [place], UROBILINOGEN, NITRITE, LEUKOCYTESUR,  in the last 168 hours  Lipid Panel:     Component Value Date/Time   CHOL 197 07/03/2013 2110   TRIG 190* 07/03/2013 2110   HDL 59 07/03/2013 2110   CHOLHDL 3.3 07/03/2013 2110   VLDL 38 07/03/2013 2110   LDLCALC 100* 07/03/2013 2110    HgbA1C:  Lab Results  Component Value Date   HGBA1C 9.3* 05/30/2013    Urine Drug Screen:     Component Value Date/Time   LABOPIA POSITIVE* 07/03/2013 1804   COCAINSCRNUR NONE DETECTED 07/03/2013 1804   LABBENZ POSITIVE* 07/03/2013 1804   AMPHETMU NONE DETECTED 07/03/2013 1804   THCU NONE DETECTED 07/03/2013 1804   LABBARB NONE DETECTED 07/03/2013 1804    Alcohol Level:  Recent Labs Lab 01/21/14 0515  ETH <11    Other results: EKG: sinus rhythm at 85 bpm.  Imaging: Ct Head Wo Contrast  01/21/2014   CLINICAL DATA:  Prolonged seizure  EXAM: CT HEAD WITHOUT CONTRAST  TECHNIQUE: Contiguous axial images were obtained from the base of the skull through the vertex without intravenous contrast.  COMPARISON:  CT HEAD W/O CM dated 07/03/2013; CT HEAD W/O CM dated 04/30/2013  FINDINGS: Mild atrophy with sulcal prominence. Scattered minimal periventricular hypodensities compatible microvascular ischemic disease. No CT evidence for acute large territory infarct. No intraparenchymal  extra-axial mass or hemorrhage. Unchanged size and configuration of the ventricles and basilar  cisterns. No midline shift. Intracranial atherosclerosis. There is minimal polypoid mucosal thickening within the bilateral sphenoid sinuses. Remaining paranasal sinuses and mastoid air cells are normally aerated. No air-fluid levels. Regional soft tissues appear normal. No displaced calvarial fracture.  IMPRESSION: Similar findings of mild atrophy and microvascular ischemic disease without acute intracranial process.   Electronically Signed   By: Sandi Mariscal M.D.   On: 01/21/2014 09:51   Dg Chest Port 1 View  01/21/2014   CLINICAL DATA:  Hypoglycemic seizure.  EXAM: PORTABLE CHEST - 1 VIEW  COMPARISON:  07/03/2013  FINDINGS: The heart size and mediastinal contours are within normal limits. Both lungs are clear. The bony thorax is intact.  IMPRESSION: No active disease.   Electronically Signed   By: Lajean Manes M.D.   On: 01/21/2014 09:05     Assessment/Plan: 68 year old male presenting after being noted to have multiple seizures.  It seems that the patient was also hypoglycemic as well.  Has been drinking although denies regular consumption of alcohol.  Head CT reviewed and shows no acute changes.  EEG shows no epileptiform discharges.   Seizures were likely provoked.    Recommendations: 1.  Anticonvulsant therapy not indicated at this time.   2.  MRI of the brain with and without contrast 3.  Ativan prn 4.  Blood sugar control 5.  Seizure precautions 6.  Patient unable to drive, operate heavy machinery, perform activities at heights and participate in water activities until release by outpatient physician.   Alexis Goodell, MD [facility] [phone] 01/21/2014, 11:34 AM

## 2014-01-21 NOTE — ED Notes (Signed)
Lunch tray given to patient

## 2014-01-21 NOTE — ED Notes (Signed)
Attempting to give report and cbg 35, gave 1 amp of d 50 and  Paged dr Grandville Silos with [facility]

## 2014-01-21 NOTE — ED Notes (Signed)
Patient alert taking on phone with family member alert answering and following commands appropriate.

## 2014-01-21 NOTE — H&P (Addendum)
[facility] History and Physical  [name] [name] [id number] DOB: 19-Feb-1946 DOA: 01/21/2014  Referring physician: Dr. Sharol Given PCP: No primary provider on file. Per Patient Dr [name] [name]. [facility].  Chief Complaint: Seizures  HPI: [name] [name] is a 67 y.o. male  With history of hypertension, diabetes, medical noncompliance, chronic back pain who presents to the ED with seizures and hypoglycemia. Patient is not sure why he was brought to the emergency room and a such history was obtained from ED physician. Per ED physician EMS was called out to patient's house 2 times a night prior to admission. Initially was called around 11 PM as was noted that patient was having hand shaking and then high and low blood sugars throughout the day. Patient was noted to be intoxicated belligerent and refused treatment at that time. Patient states he only took 2 drinks of couvoirser early on that evening. EMS was subsequently called again around 4 AM due to blood sugar reading of high and patient was given 10 units of NovoLog. Per ED physician family was unsure if patient took his Lantus. EMS reports just prior to arrival patient was noted to become stiff unresponsive and combative with a blood sugar that time of 135. On arrival to the emergency room patient was noted to have another seizure and at that time the CBC had dropped to 83 patient was given half an amp of D50 and 2 mg of Ativan. Per ED physician Chem-7 which was drawn upon arrival at a glucose level of 24. Patient did not have any tongue biting or urinary incontinence. Patient was noted to recover from anesthesia and was postictal at that time. At the time of my interview patient was awake alert answering questions appropriately and felt he did not have a seizure. Patient stated that he was instructed when he struck his difficult to awake. Patient denies any fevers, no chills, no nausea, no vomiting, no, pain, no diarrhea, no  constipation, no chest pain, no shortness of breath, no dysuria, no melena, no hematemesis, no hematochezia. Patient does endorse some generalized weakness. In the emergency room lactic level which was obtained was elevated at 7.28. Chem-7 had a potassium of 3 otherwise was within normal limits. CBC was unremarkable. EKG showed a normal sinus rhythm and right atrial enlargement. CT scan of the head was not done. Urinalysis is pending at the time. Chest x-ray was not done and a such asked EDP to get a chest x-ray. Alcohol level was less than 11. UDS was not obtained. We were called to admit the patient for further evaluation and management.   Review of Systems: As per history of present illness otherwise negative. Constitutional:  No weight loss, night sweats, Fevers, chills, fatigue.  HEENT:  No headaches, Difficulty swallowing,Tooth/dental problems,Sore throat,  No sneezing, itching, ear ache, nasal congestion, post nasal drip,  Cardio-vascular:  No chest pain, Orthopnea, PND, swelling in lower extremities, anasarca, dizziness, palpitations  GI:  No heartburn, indigestion, abdominal pain, nausea, vomiting, diarrhea, change in bowel habits, loss of appetite  Resp:  No shortness of breath with exertion or at rest. No excess mucus, no productive cough, No non-productive cough, No coughing up of blood.No change in color of mucus.No wheezing.No chest wall deformity  Skin:  no rash or lesions.  GU:  no dysuria, change in color of urine, no urgency or frequency. No flank pain.  Musculoskeletal:  No joint pain or swelling. No decreased range of motion. No back pain.  Psych:  No change in mood or affect. No depression or anxiety. No memory loss.   Past Medical History  Diagnosis Date  . Diabetes mellitus without complication   . Hypertension   . Hernia   . Back pain    Past Surgical History  Procedure Laterality Date  . No past surgeries    . Hip arthroplasty Right 02/05/2013     Procedure: ARTHROPLASTY BIPOLAR HIP;  Surgeon: [name], MD;  Location: [facility];  Service: Orthopedics;  Laterality: Right;   Social History:  reports that he quit smoking about a year ago. His smoking use included Cigarettes. He has a 7.5 pack-year smoking history. He has never used smokeless tobacco. He reports that he drinks alcohol. He reports that he does not use illicit drugs.  No Known Allergies  History reviewed. No pertinent family history.   Prior to Admission medications   Medication Sig Start Date End Date Taking? Authorizing Provider  alprazolam [name]) 2 MG tablet Take 2 mg by mouth 2 (two) times daily as needed for anxiety.  06/10/13   Historical Provider, MD  cholecalciferol (VITAMIN D) 1000 UNITS tablet Take 1,000 Units by mouth every morning.    Historical Provider, MD  insulin aspart protamine- aspart (NOVOLOG MIX 70/30) (70-30) 100 UNIT/ML injection Inject 9 Units into the skin 2 (two) times daily with a meal.    Historical Provider, MD  insulin glargine (LANTUS) 100 UNIT/ML injection Inject 15 Units into the skin at bedtime.    Historical Provider, MD  oxycodone (ROXICODONE) 30 MG immediate release tablet Take 30 mg by mouth every 6 (six) hours as needed for pain.    Historical Provider, MD  ramipril (ALTACE) 10 MG capsule Take 10 mg by mouth every morning.    Historical Provider, MD   Physical Exam: Filed Vitals:   01/21/14 0832  BP:   Pulse:   Temp: 99.1 F (37.3 C)  Resp:     BP 159/81  Pulse 63  Temp(Src) 99.1 F (37.3 C) (Oral)  Resp 28  SpO2 99%  General:  Appears calm and comfortable. Sleeping but easily arousable. No acute cardiopulmonary distress. Eyes: PERRLA, EOMI, normal lids, irises & conjunctiva. ENT: grossly normal hearing, lips & tongue, poor dentition. Neck: no LAD, masses or thyromegaly Cardiovascular: RRR, no m/r/g. No LE edema. Telemetry: SR, no arrhythmias  Respiratory: CTA bilaterally, no w/r/r. Normal respiratory  effort. Abdomen: soft, ntnd, positive bowel sounds, no rebound, no guarding Skin: no rash or induration seen on limited exam Musculoskeletal: grossly normal tone BUE/BLE Psychiatric: grossly normal mood and affect, speech fluent and appropriate Neurologic: Alert and oriented x3. Cranial nerves II through XII are grossly intact. Sensation is intact. Visual fields are intact. Gait not tested secondary to safety.           Labs on Admission:  Basic Metabolic Panel:  Recent Labs Lab 01/21/14 0534  NA 142  K 3.0*  CL 106  GLUCOSE 24*  BUN 13  CREATININE 1.10   Liver Function Tests: No results found for this basename: AST, ALT, ALKPHOS, BILITOT, PROT, ALBUMIN,  in the last 168 hours No results found for this basename: LIPASE, AMYLASE,  in the last 168 hours No results found for this basename: AMMONIA,  in the last 168 hours CBC:  Recent Labs Lab 01/21/14 0515 01/21/14 0534  WBC 9.0  --   NEUTROABS 7.2  --   HGB 13.9 15.0  HCT 40.9 44.0  MCV 88.7  --   PLT 247  --  Cardiac Enzymes: No results found for this basename: CKTOTAL, CKMB, CKMBINDEX, TROPONINI,  in the last 168 hours  BNP (last 3 results) No results found for this basename: PROBNP,  in the last 8760 hours CBG:  Recent Labs Lab 01/21/14 0603 01/21/14 0633 01/21/14 0704 01/21/14 0814 01/21/14 0842  GLUCAP 83 45* 146* 42* 109*    Radiological Exams on Admission: Dg Chest Port 1 View  01/21/2014   CLINICAL DATA:  Hypoglycemic seizure.  EXAM: PORTABLE CHEST - 1 VIEW  COMPARISON:  07/03/2013  FINDINGS: The heart size and mediastinal contours are within normal limits. Both lungs are clear. The bony thorax is intact.  IMPRESSION: No active disease.   Electronically Signed   By: Lajean Manes M.D.   On: 01/21/2014 09:05    EKG: Independently reviewed. Normal sinus rhythm. Right atrial enlargement.  Assessment/Plan Principal Problem:   Seizure Active Problems:   DIABETES MELLITUS, TYPE II   CKD (chronic  kidney disease) stage 2, GFR 60-89 ml/min   Anemia of chronic disease   HTN (hypertension)   Hypoglycemia   Hypokalemia   Lactic acidosis  #1 seizures Patient noted to have seizure on EMS arrival to his home and also on arrival to the emergency room. Questionable etiology. Patient denies any history of seizures. May be secondary to metabolic derangements as Chem-7. EDPA on arrival to the ED when patient was having the seizure had a blood glucose of 24. Potassium level was at 3. Patient was given some Ativan and D50 and currently at baseline. Will check a CT head. Check a chest x-ray. Check a UDS. Check a hepatic panel. Check a sulfonylurea panel. Check a magnesium level. Check a TSH. Check a UA with cultures and sensitivities. Check a EEG. Repeat lactic acid level is pending. Seizure precautions. Ativan as needed. Will consult with neurology for further evaluation and management.  #2 hypoglycemia Questionable etiology. May be secondary to an infectious etiology versus brittle diabetes versus insulin induced as patient was given 10 of NovoLog per family when CBGs were noted to be high and patient also states has been compliant with his Lantus. Will check a hemoglobin A1c. Check a sulfonylurea panel. Check a TSH. Check a cortisol level. Check a C-peptide. Will hold his Lantus and NovoLog. Will place on D5 normal saline with frequent CBGs and follow.  #3 hypertension Stable. Continue ACE inhibitor.  #4 hypokalemia Check a magnesium level. Replete.  #5 lactic acidosis Likely secondary to problem #1. Repeat lactic acid level is pending. Place on IV fluids. Repeat levels in the morning. Will check a UA with cultures and sensitivities. Check a chest x-ray. Follow.  #6 anemia of chronic disease H&H stable. Follow.  #7 diabetes mellitus type 2 Check a hemoglobin A1c. Patient noted on admission to be hypoglycemic and a such will hold off on his Lantus and Humalog. Check CBCs every 2 hours for the  next 12 hours and if stable will check every 4 hours. Will place on D5 normal saline secondary to problem #2. Follow.  #8 prophylaxis PPI for GI prophylaxis. Lovenox for DVT prophylaxis.   Code Status: Full Family Communication: Updated patient no family present. Disposition Plan: Admit to telemetry  Time spent: [place] MD [facility] Pager [phone]

## 2014-01-21 NOTE — Progress Notes (Signed)
EEG completed; results pending.    

## 2014-01-21 NOTE — ED Notes (Signed)
Lunch tray ordered 

## 2014-01-21 NOTE — ED Notes (Signed)
Pt to MRI now

## 2014-01-21 NOTE — ED Notes (Signed)
Dr Sharol Given given a copy of chem 8 results and lactic acid results 7.28

## 2014-01-21 NOTE — ED Notes (Signed)
D5 NS started at 125 ml / hr.. Will not scan.

## 2014-01-21 NOTE — ED Notes (Signed)
Dr. Sharol Given made aware of CBG. Pt. To have a [place] sandwich and OJ.

## 2014-01-22 LAB — HEMOGLOBIN A1C
Hgb A1c MFr Bld: 8.1 % — ABNORMAL HIGH (ref <5.7)
Mean Plasma Glucose: 186 mg/dL — ABNORMAL HIGH (ref <117)

## 2014-01-22 LAB — COMPREHENSIVE METABOLIC PANEL
ALT: 11 U/L (ref 0–53)
AST: 15 U/L (ref 0–37)
Albumin: 3 g/dL — ABNORMAL LOW (ref 3.5–5.2)
Alkaline Phosphatase: 65 U/L (ref 39–117)
BUN: 10 mg/dL (ref 6–23)
CO2: 21 mEq/L (ref 19–32)
Calcium: 8.5 mg/dL (ref 8.4–10.5)
Chloride: 106 mEq/L (ref 96–112)
Creatinine, Ser: 0.95 mg/dL (ref 0.50–1.35)
GFR calc Af Amer: >90 mL/min (ref >90)
GFR calc non Af Amer: 84 mL/min — ABNORMAL LOW (ref >90)
Glucose, Bld: 216 mg/dL — ABNORMAL HIGH (ref 70–99)
Potassium: 3.9 mEq/L (ref 3.7–5.3)
Sodium: 138 mEq/L (ref 137–147)
Total Bilirubin: 0.3 mg/dL (ref 0.3–1.2)
Total Protein: 6 g/dL (ref 6.0–8.3)

## 2014-01-22 LAB — CBC
HCT: 33.1 % — ABNORMAL LOW (ref 39.0–52.0)
Hemoglobin: 11.6 g/dL — ABNORMAL LOW (ref 13.0–17.0)
MCH: 30.8 pg (ref 26.0–34.0)
MCHC: 35 g/dL (ref 30.0–36.0)
MCV: 87.8 fL (ref 78.0–100.0)
Platelets: 192 10*3/uL (ref 150–400)
RBC: 3.77 MIL/uL — ABNORMAL LOW (ref 4.22–5.81)
RDW: 13.4 % (ref 11.5–15.5)
WBC: 7.3 10*3/uL (ref 4.0–10.5)

## 2014-01-22 LAB — GLUCOSE, CAPILLARY
Glucose-Capillary: 217 mg/dL — ABNORMAL HIGH (ref 70–99)
Glucose-Capillary: 333 mg/dL — ABNORMAL HIGH (ref 70–99)
Glucose-Capillary: 62 mg/dL — ABNORMAL LOW (ref 70–99)
Glucose-Capillary: 97 mg/dL (ref 70–99)
Glucose-Capillary: 376 mg/dL — ABNORMAL HIGH (ref 70–99)
Glucose-Capillary: 315 mg/dL — ABNORMAL HIGH (ref 70–99)
Glucose-Capillary: 180 mg/dL — ABNORMAL HIGH (ref 70–99)
Glucose-Capillary: 255 mg/dL — ABNORMAL HIGH (ref 70–99)
Glucose-Capillary: 335 mg/dL — ABNORMAL HIGH (ref 70–99)

## 2014-01-22 LAB — C-PEPTIDE: C-Peptide: 0 ng/mL — ABNORMAL LOW (ref 0.80–3.90)

## 2014-01-22 MED ORDER — INSULIN ASPART 100 UNIT/ML ~~LOC~~ SOLN
1.0000 [IU] | Freq: Three times a day (TID) | SUBCUTANEOUS | Status: DC
  Administered 2014-01-22 (×2): 4 [IU] via SUBCUTANEOUS

## 2014-01-22 MED ORDER — INSULIN ASPART 100 UNIT/ML ~~LOC~~ SOLN: 0.0000 [IU] | SUBCUTANEOUS | Status: DC

## 2014-01-22 MED ORDER — INSULIN ASPART 100 UNIT/ML ~~LOC~~ SOLN
1.0000 [IU] | SUBCUTANEOUS | Status: DC
  Administered 2014-01-22 – 2014-01-23 (×2): 4 [IU] via SUBCUTANEOUS

## 2014-01-22 MED ORDER — INSULIN GLARGINE 100 UNIT/ML ~~LOC~~ SOLN
10.0000 [IU] | Freq: Every morning | SUBCUTANEOUS | Status: DC
  Administered 2014-01-22 – 2014-01-24 (×3): 10 [IU] via SUBCUTANEOUS
  Filled 2014-01-22 (×4): qty 0.1

## 2014-01-22 MED ORDER — INSULIN ASPART 100 UNIT/ML ~~LOC~~ SOLN: 0.0000 [IU] | Freq: Three times a day (TID) | SUBCUTANEOUS | Status: DC

## 2014-01-22 MED ORDER — INSULIN ASPART 100 UNIT/ML ~~LOC~~ SOLN
7.0000 [IU] | Freq: Once | SUBCUTANEOUS | Status: AC
  Administered 2014-01-22: 7 [IU] via SUBCUTANEOUS

## 2014-01-22 NOTE — Progress Notes (Signed)
[facility] PROGRESS NOTE  Kaley Manjarres [id number] DOB: [birth year] DOA: 01/21/2014 PCP: No primary provider on file.  Assessment/Plan: #1 seizures Questionable etiology. May have been metabolic in nature secondary to hypoglycemia. CT of the head is negative. MRI of the head is negative. EEG shows no epileptiform discharges. CBGs have improved and patient has been discontinued from D5 normal saline. Patient currently on normal saline. Per neurology anticonvulsant therapy is not indicated at this time. Ativan as needed. Neurology following and appreciate input and recommendations.  #2 hypoglycemia Likely secondary to exogenous insulin. No signs or symptoms of infection. Patient is currently afebrile. Lipase levels trending down. White count is normal lites. Chest x-ray is negative for any acute infection. Urinalysis is negative. CBGs have improved. CBGs have ranged from 62 -346. CRP 0.00. Cortisol level within normal limits at 21.4. TSH within normal limits. Patient is currently off D5 normal saline and currently on normal saline. Patient has been started on a diet. Change CBGs 2 every 4 hours. Continue sensitive sliding scale insulin. Monitor and titrated sliding scale as needed. May need to be resumed on half home dose of 70/30. Follow. Will follow CBGs for now.  #3 hypertension ACE inhibitor to be resumed this morning. Follow and titrate as needed.  #4 hypokalemia Magnesium level of 2.3. Repleted.  #5 lactic acidosis Likely secondary to problem #1. Improved.  #6 anemia of chronic disease H&H stable follow.  #7 type 2 diabetes Hemoglobin A1c is 8.1. CBGs are range from 62-346. Change CBGs 2 every 4 hours. Continue sliding scale insulin. Patient has been started on a modified diet. Monitor follow. May need to be resumed on half home dose 70/30 if blood sugars remain elevated. Consult with diabetic coordinator.  #8 prophylaxis PPI for GI prophylaxis. Lovenox for DVT  prophylaxis.  Code Status: Full Family Communication: Updated patient no family at bedside. Disposition Plan: Remaining step down today.   Consultants:  Neurology: Dr. Doy Mince 01/21/2014  Procedures:  CT head 01/21/2014  MRI head 01/21/2014  Chest x-ray 01/21/2014  EEG 01/21/2014  Antibiotics:  None  HPI/Subjective: Patient states he's feeling fine. No seizures noted overnight per nursing. Patient with a CBG of 62 this morning however was asymptomatic.  Objective: Filed Vitals:   01/22/14 0700  BP: 169/78  Pulse: 52  Temp: 98.9 F (37.2 C)  Resp: 23    Intake/Output Summary (Last 24 hours) at 01/22/14 0855 Last data filed at 01/22/14 0745  Gross per 24 hour  Intake   1750 ml  Output   1350 ml  Net    400 ml   Filed Weights   01/21/14 1527 01/22/14 0500  Weight: 68.3 kg (150 lb 9.2 oz) 68.5 kg (151 lb 0.2 oz)    Exam:   General:  NAD  Cardiovascular: RRR  Respiratory: CTAB  Abdomen: Soft, nontender, nondistended, positive bowel sounds.  Musculoskeletal: No clubbing cyanosis or edema   Data Reviewed: Basic Metabolic Panel:  Recent Labs Lab 01/21/14 0534 01/21/14 0845 01/21/14 0955 01/21/14 1105 01/21/14 1620 01/22/14 0451  NA 142 141  --  140  --  138  K 3.0* 4.1  --  3.7  --  3.9  CL 106 104  --  105  --  106  CO2  --  21  --  20  --  21  GLUCOSE 24* 119*  --  96  --  216*  BUN 13 15  --  14  --  10  CREATININE 1.10 0.95  --  0.93 0.97 0.95  CALCIUM  --  9.0  --  8.7  --  8.5  MG  --   --  2.3  --   --   --    Liver Function Tests:  Recent Labs Lab 01/21/14 0955 01/22/14 0451  AST 20 15  ALT 12 11  ALKPHOS 68 65  BILITOT 0.6 0.3  PROT 7.0 6.0  ALBUMIN 3.5 3.0*   No results found for this basename: LIPASE, AMYLASE,  in the last 168 hours No results found for this basename: AMMONIA,  in the last 168 hours CBC:  Recent Labs Lab 01/21/14 0515 01/21/14 0534 01/21/14 1620 01/22/14 0451  WBC 9.0  --  9.6 7.3   NEUTROABS 7.2  --   --   --   HGB 13.9 15.0 12.5* 11.6*  HCT 40.9 44.0 36.6* 33.1*  MCV 88.7  --  89.5 87.8  PLT 247  --  198 192   Cardiac Enzymes: No results found for this basename: CKTOTAL, CKMB, CKMBINDEX, TROPONINI,  in the last 168 hours BNP (last 3 results) No results found for this basename: PROBNP,  in the last 8760 hours CBG:  Recent Labs Lab 01/21/14 1954 01/21/14 2152 01/21/14 2330 01/22/14 0212 01/22/14 0432  GLUCAP 275* 269* 346* 217* 333*    Recent Results (from the past 240 hour(s))  MRSA PCR SCREENING     Status: None   Collection Time    01/21/14  6:45 PM      Result Value Ref Range Status   MRSA by PCR NEGATIVE  NEGATIVE Final   Comment:            The GeneXpert MRSA Assay (FDA     approved for NASAL specimens     only), is one component of a     comprehensive MRSA colonization     surveillance program. It is not     intended to diagnose MRSA     infection nor to guide or     monitor treatment for     MRSA infections.     Studies: Ct Head Wo Contrast  01/21/2014   CLINICAL DATA:  Prolonged seizure  EXAM: CT HEAD WITHOUT CONTRAST  TECHNIQUE: Contiguous axial images were obtained from the base of the skull through the vertex without intravenous contrast.  COMPARISON:  CT HEAD W/O CM dated 07/03/2013; CT HEAD W/O CM dated 04/30/2013  FINDINGS: Mild atrophy with sulcal prominence. Scattered minimal periventricular hypodensities compatible microvascular ischemic disease. No CT evidence for acute large territory infarct. No intraparenchymal extra-axial mass or hemorrhage. Unchanged size and configuration of the ventricles and basilar cisterns. No midline shift. Intracranial atherosclerosis. There is minimal polypoid mucosal thickening within the bilateral sphenoid sinuses. Remaining paranasal sinuses and mastoid air cells are normally aerated. No air-fluid levels. Regional soft tissues appear normal. No displaced calvarial fracture.  IMPRESSION: Similar  findings of mild atrophy and microvascular ischemic disease without acute intracranial process.   Electronically Signed   By: Sandi Mariscal M.D.   On: 01/21/2014 09:51   Mr Brain Wo Contrast  01/21/2014   CLINICAL DATA:  New onset seizures. Low blood sugar. Diabetic hypertensive patient  EXAM: MRI HEAD WITHOUT CONTRAST  TECHNIQUE: Multiplanar, multiecho pulse sequences of the brain and surrounding structures were obtained without intravenous contrast.  COMPARISON:  01/21/2014 CT.  No comparison MR.  FINDINGS: Patient without IV access and therefore performed without contrast. Dr. Doy Mince notifyed.  Exam is motion degraded.  No acute infarct.  No evidence of mesial temporal sclerosis.  No intracranial hemorrhage.  Moderate small vessel disease type changes.  Global atrophy without hydrocephalus.  Major intracranial vascular structures are patent. Vertebral arteries and basilar artery are ectatic.  Paranasal sinus mucosal thickening most notable right sphenoid sinus.  Cervical spondylotic changes with spinal stenosis and mild cord flattening C3-4 and less so C5-6.  Partially empty sella.  Orbital structures grossly within normal limits.  IMPRESSION: Patient without IV access and therefore performed without contrast. Dr. Doy Mince notifyed.  Exam is motion degraded.  No acute infarct.  No evidence of mesial temporal sclerosis.  No intracranial hemorrhage.  Moderate small vessel disease type changes.  Global atrophy without hydrocephalus.  No intracranial mass lesion noted on this unenhanced motion degraded exam.  Paranasal sinus mucosal thickening most notable right sphenoid sinus.  Cervical spondylotic changes with spinal stenosis and mild cord flattening C3-4 and less so C5-6.  Partially empty sella.   Electronically Signed   By: Chauncey Cruel M.D.   On: 01/21/2014 15:15   Dg Chest Port 1 View  01/21/2014   CLINICAL DATA:  Hypoglycemic seizure.  EXAM: PORTABLE CHEST - 1 VIEW  COMPARISON:  07/03/2013  FINDINGS: The  heart size and mediastinal contours are within normal limits. Both lungs are clear. The bony thorax is intact.  IMPRESSION: No active disease.   Electronically Signed   By: Lajean Manes M.D.   On: 01/21/2014 09:05    Scheduled Meds: . docusate sodium  100 mg Oral BID  . enoxaparin (LOVENOX) injection  40 mg Subcutaneous Q24H  . folic acid  1 mg Oral Daily  . insulin aspart  0-9 Units Subcutaneous 6 times per day  . multivitamin with minerals  1 tablet Oral Daily  . pantoprazole  40 mg Oral Q0600  . ramipril  10 mg Oral q morning - 10a  . sodium chloride  3 mL Intravenous Q12H  . thiamine  100 mg Oral Daily   Continuous Infusions: . sodium chloride 75 mL/hr at 01/22/14 [id number]    Principal Problem:   Seizure Active Problems:   DIABETES MELLITUS, TYPE II   CKD (chronic kidney disease) stage 2, GFR 60-89 ml/min   Anemia of chronic disease   HTN (hypertension)   Hypoglycemia   Hypokalemia   Lactic acidosis   Seizures    Time spent: 40 mins    Eugenie Filler MD [facility] Pager [phone]. If 7PM-7AM, please contact night-coverage at [email or web address], password [email or web address] 01/22/2014, 8:55 AM  LOS: 1 day

## 2014-01-22 NOTE — Progress Notes (Signed)
Hypoglycemic Event  CBG: 62  Treatment: 15 GM carbohydrate snack  Symptoms: None  Follow-up CBG: Time: 0820 CBG Result: 97  Possible Reasons for Event: Unknown  Comments/MD notified: Dr. Laury Deep  Remember to initiate Hypoglycemia Order Set & complete

## 2014-01-22 NOTE — Evaluation (Signed)
Physical Therapy Evaluation Patient Details Name: [name] [name] MRN: [id number] DOB: [birth year] Today's Date: 01/22/2014   History of Present Illness    With history of hypertension, diabetes, medical noncompliance, chronic back pain who presents to the ED with seizures and hypoglycemia.    Clinical Impression  Pt adm due to the above. Presents to be ambulating at baseline at this time. Scored 20 on DGI; meaning pt is at low risk for falls. No focal weakness or balance deficits indicated at this time. Will sign off on pt at this time.     Follow Up Recommendations No PT follow up;Supervision - Intermittent    Equipment Recommendations  None recommended by PT    Recommendations for Other Services       Precautions / Restrictions Precautions Precautions: None Precaution Comments: denies any falls  Restrictions Weight Bearing Restrictions: No      Mobility  Bed Mobility Overal bed mobility: Independent                Transfers Overall transfer level: Modified independent Equipment used:  (IV pole- minimally )             General transfer comment: no sway or LOB noted.   Ambulation/Gait Ambulation/Gait assistance: Modified independent (Device/Increase time) Ambulation Distance (Feet): 300 Feet Assistive device: None (IV pole ) Gait Pattern/deviations: WFL(Within Functional Limits) Gait velocity: WFL  Gait velocity interpretation: at or above normal speed for age/gender General Gait Details: pt ambulating unit with IV pole; no LOB noted with gt and during DGI; pt demo good safety awareness; see DIG results for balance   Stairs Stairs: Yes Stairs assistance: Supervision Stair Management: Two rails;Step to pattern;Forwards Number of Stairs: 3 General stair comments: good technique; supervision only for safety and management of lines; was able to perform parts of DGi without IV pole; primarily uses SPC at home   Wheelchair Mobility    Modified Rankin (Stroke  Patients Only)       Balance Overall balance assessment: No apparent balance deficits (not formally assessed)                               Standardized Balance Assessment Standardized Balance Assessment : Dynamic Gait Index   Dynamic Gait Index Level Surface: Mild Impairment Change in Gait Speed: Mild Impairment Gait with Horizontal Head Turns: Normal Gait with Vertical Head Turns: Normal Gait and Pivot Turn: Normal Step Over Obstacle: Normal Step Around Obstacles: Normal Steps: Moderate Impairment Total Score: 20       Pertinent Vitals/Pain No c/o pain. Vitals stable t/o session.     Home Living Family/patient expects to be discharged to:: Private residence Living Arrangements: Other relatives;Children (brothers, sisters ) Available Help at Discharge: Family;Available 24 hours/day Type of Home: House Home Access: Stairs to enter   [employer] of Steps: can enter through back door-no steps Home Layout: Multi-level;Bed/bath upstairs Home Equipment: Walker - 2 wheels;Crutches;Cane - single point;Shower seat      Prior Function Level of Independence: Independent with assistive device(s)         Comments: ambulates with cane; cooks, cleans; brother drives him due to cataracts      Hand Dominance        Extremity/Trunk Assessment   Upper Extremity Assessment: Overall WFL for tasks assessed           Lower Extremity Assessment: Overall WFL for tasks assessed      Cervical / Trunk  Assessment: Normal  Communication   Communication: No difficulties  Cognition Arousal/Alertness: Awake/alert Behavior During Therapy: WFL for tasks assessed/performed Overall Cognitive Status: Within Functional Limits for tasks assessed                      General Comments      Exercises        Assessment/Plan    PT Assessment Patent does not need any further PT services  PT Diagnosis     PT Problem List    PT Treatment  Interventions     PT Goals (Current goals can be found in the Care Plan section) Acute Rehab PT Goals Patient Stated Goal: to go home when my blood sugar is good PT Goal Formulation: No goals set, d/c therapy    Frequency     Barriers to discharge        Co-evaluation               End of Session Equipment Utilized During Treatment: Gait belt Activity Tolerance: Patient tolerated treatment well Patient left: in chair;with call bell/phone within reach Nurse Communication: Mobility status         Time: [id number] PT Time Calculation (min): 17 min   Charges:   PT Evaluation $Initial PT Evaluation Tier I: 1 Procedure PT Treatments $Gait Training: 8-22 mins   PT G CodesKennis Carina Lyons, [place] 01/22/2014, 1:03 PM

## 2014-01-22 NOTE — Progress Notes (Signed)
Subjective: Patient has had no further seizure activity per nursing.  No complaints.    Objective: Current vital signs: BP 169/78  Pulse 52  Temp(Src) 98.9 F (37.2 C) (Oral)  Resp 23  Ht 5\' 9"  (1.753 m)  Wt 68.5 kg (151 lb 0.2 oz)  BMI 22.29 kg/m2  SpO2 98% Vital signs in last 24 hours: Temp:  [98.6 F (37 C)-99 F (37.2 C)] 98.9 F (37.2 C) (04/12 0700) Pulse Rate:  [52-75] 52 (04/12 0700) Resp:  [13-31] 23 (04/12 0700) BP: (134-183)/(69-90) 169/78 mmHg (04/12 0700) SpO2:  [93 %-100 %] 98 % (04/12 0700) Weight:  [68.3 kg (150 lb 9.2 oz)-68.5 kg (151 lb 0.2 oz)] 68.5 kg (151 lb 0.2 oz) (04/12 0500)  Intake/Output from previous day: 04/11 0701 - 04/12 0700 In: 1750 [I.V.:1750] Out: 1050 [Urine:1050] Intake/Output this shift: Total I/O In: -  Out: 300 [Urine:300] Nutritional status: Carb Control  Neurologic Exam: Mental Status:  Alert, oriented, thought content appropriate. Speech fluent without evidence of aphasia. Able to follow 3 step commands without difficulty.  Cranial Nerves:  II: Discs flat bilaterally; Visual fields grossly normal, pupils equal, round, reactive to light and accommodation  III,IV, VI: ptosis not present, extra-ocular motions intact bilaterally  V,VII: smile symmetric, facial light touch sensation normal bilaterally  VIII: hearing normal bilaterally  IX,X: gag reflex present  XI: bilateral shoulder shrug  XII: midline tongue extension  Motor:  5/5 throughout Tone and bulk:normal tone throughout; no atrophy noted  Sensory: Pinprick and light touch intact throughout, bilaterally  Deep Tendon Reflexes: 2+ in the upper extremities, 1+ at the knees and absent at the ankles  Plantars:  Right: downgoing   Left: downgoing  Cerebellar:  normal finger-to-nose and normal heel-to-shin test   Lab Results: Basic Metabolic Panel:  Recent Labs Lab 01/21/14 0534 01/21/14 0845 01/21/14 0955 01/21/14 1105 01/21/14 1620 01/22/14 0451  NA 142 141   --  140  --  138  K 3.0* 4.1  --  3.7  --  3.9  CL 106 104  --  105  --  106  CO2  --  21  --  20  --  21  GLUCOSE 24* 119*  --  96  --  216*  BUN 13 15  --  14  --  10  CREATININE 1.10 0.95  --  0.93 0.97 0.95  CALCIUM  --  9.0  --  8.7  --  8.5  MG  --   --  2.3  --   --   --     Liver Function Tests:  Recent Labs Lab 01/21/14 0955 01/22/14 0451  AST 20 15  ALT 12 11  ALKPHOS 68 65  BILITOT 0.6 0.3  PROT 7.0 6.0  ALBUMIN 3.5 3.0*   No results found for this basename: LIPASE, AMYLASE,  in the last 168 hours No results found for this basename: AMMONIA,  in the last 168 hours  CBC:  Recent Labs Lab 01/21/14 0515 01/21/14 0534 01/21/14 1620 01/22/14 0451  WBC 9.0  --  9.6 7.3  NEUTROABS 7.2  --   --   --   HGB 13.9 15.0 12.5* 11.6*  HCT 40.9 44.0 36.6* 33.1*  MCV 88.7  --  89.5 87.8  PLT 247  --  198 192    Cardiac Enzymes: No results found for this basename: CKTOTAL, CKMB, CKMBINDEX, TROPONINI,  in the last 168 hours  Lipid Panel: No results found for this basename: CHOL,  TRIG, HDL, CHOLHDL, VLDL, LDLCALC,  in the last 168 hours  CBG:  Recent Labs Lab 01/21/14 1954 01/21/14 2152 01/21/14 2330 01/22/14 0212 01/22/14 0432  GLUCAP 275* 269* 346* 217* 333*    Microbiology: Results for orders placed during the hospital encounter of 01/21/14  MRSA PCR SCREENING     Status: None   Collection Time    01/21/14  6:45 PM      Result Value Ref Range Status   MRSA by PCR NEGATIVE  NEGATIVE Final   Comment:            The GeneXpert MRSA Assay (FDA     approved for NASAL specimens     only), is one component of a     comprehensive MRSA colonization     surveillance program. It is not     intended to diagnose MRSA     infection nor to guide or     monitor treatment for     MRSA infections.    Coagulation Studies:  Recent Labs  01/21/14 1620  LABPROT 13.2  INR 1.02    Imaging: Ct Head Wo Contrast  01/21/2014   CLINICAL DATA:  Prolonged seizure   EXAM: CT HEAD WITHOUT CONTRAST  TECHNIQUE: Contiguous axial images were obtained from the base of the skull through the vertex without intravenous contrast.  COMPARISON:  CT HEAD W/O CM dated 07/03/2013; CT HEAD W/O CM dated 04/30/2013  FINDINGS: Mild atrophy with sulcal prominence. Scattered minimal periventricular hypodensities compatible microvascular ischemic disease. No CT evidence for acute large territory infarct. No intraparenchymal extra-axial mass or hemorrhage. Unchanged size and configuration of the ventricles and basilar cisterns. No midline shift. Intracranial atherosclerosis. There is minimal polypoid mucosal thickening within the bilateral sphenoid sinuses. Remaining paranasal sinuses and mastoid air cells are normally aerated. No air-fluid levels. Regional soft tissues appear normal. No displaced calvarial fracture.  IMPRESSION: Similar findings of mild atrophy and microvascular ischemic disease without acute intracranial process.   Electronically Signed   By: Sandi Mariscal M.D.   On: 01/21/2014 09:51   Mr Brain Wo Contrast  01/21/2014   CLINICAL DATA:  New onset seizures. Low blood sugar. Diabetic hypertensive patient  EXAM: MRI HEAD WITHOUT CONTRAST  TECHNIQUE: Multiplanar, multiecho pulse sequences of the brain and surrounding structures were obtained without intravenous contrast.  COMPARISON:  01/21/2014 CT.  No comparison MR.  FINDINGS: Patient without IV access and therefore performed without contrast. Dr. Doy Mince notifyed.  Exam is motion degraded.  No acute infarct.  No evidence of mesial temporal sclerosis.  No intracranial hemorrhage.  Moderate small vessel disease type changes.  Global atrophy without hydrocephalus.  Major intracranial vascular structures are patent. Vertebral arteries and basilar artery are ectatic.  Paranasal sinus mucosal thickening most notable right sphenoid sinus.  Cervical spondylotic changes with spinal stenosis and mild cord flattening C3-4 and less so C5-6.   Partially empty sella.  Orbital structures grossly within normal limits.  IMPRESSION: Patient without IV access and therefore performed without contrast. Dr. Doy Mince notifyed.  Exam is motion degraded.  No acute infarct.  No evidence of mesial temporal sclerosis.  No intracranial hemorrhage.  Moderate small vessel disease type changes.  Global atrophy without hydrocephalus.  No intracranial mass lesion noted on this unenhanced motion degraded exam.  Paranasal sinus mucosal thickening most notable right sphenoid sinus.  Cervical spondylotic changes with spinal stenosis and mild cord flattening C3-4 and less so C5-6.  Partially empty sella.   Electronically Signed  By: Chauncey Cruel M.D.   On: 01/21/2014 15:15   Dg Chest Port 1 View  01/21/2014   CLINICAL DATA:  Hypoglycemic seizure.  EXAM: PORTABLE CHEST - 1 VIEW  COMPARISON:  07/03/2013  FINDINGS: The heart size and mediastinal contours are within normal limits. Both lungs are clear. The bony thorax is intact.  IMPRESSION: No active disease.   Electronically Signed   By: Lajean Manes M.D.   On: 01/21/2014 09:05    Medications:  I have reviewed the patient's current medications. Scheduled: . docusate sodium  100 mg Oral BID  . enoxaparin (LOVENOX) injection  40 mg Subcutaneous Q24H  . folic acid  1 mg Oral Daily  . insulin aspart  0-9 Units Subcutaneous 6 times per day  . multivitamin with minerals  1 tablet Oral Daily  . pantoprazole  40 mg Oral Q0600  . ramipril  10 mg Oral q morning - 10a  . sodium chloride  3 mL Intravenous Q12H  . thiamine  100 mg Oral Daily    Assessment/Plan: No further seizures noted.  MRI of the brain reviewed and although without contrast shows no evidence of acute changes.  Only significant for atrophy.  EEG unremarkable.  Seizures felt to be provoked.    Recommendations: 1.  Antiepileptic therapy not indicated at this time.   2.  No further neurologic intervention is recommended at this time.  If further  questions arise, please call or page at that time.  Thank you for allowing neurology to participate in the care of this patient.  Alexis Goodell, MD [facility] [phone] 01/22/2014  9:42 AM     LOS: 1 day

## 2014-01-22 NOTE — Progress Notes (Signed)
Inpatient Diabetes Program Recommendations  AACE/ADA: New Consensus Statement on Inpatient Glycemic Control (2013)  Target Ranges:  Prepandial:   less than 140 mg/dL      Peak postprandial:   less than 180 mg/dL (1-2 hours)      Critically ill patients:  140 - 180 mg/dL   Results for [name] [name], [name] [name] (MRN [id number]) as of 01/22/2014 10:18  Ref. Range 01/21/2014 19:54 01/21/2014 21:52 01/21/2014 23:30 01/22/2014 02:12 01/22/2014 04:32  Glucose-Capillary Latest Range: 70-99 mg/dL 275 (H) 269 (H) 346 (H) 217 (H) 333 (H)   Diabetes history: DM Outpatient Diabetes medications: Lantus 15 units QHS, 70/30 15 units BID with meals Current orders for Inpatient glycemic control: Novolog 0-9 units Q4H  Inpatient Diabetes Program Recommendations Insulin - Basal: Please consider ordering Lantus 10 units Q24H starting now (based on 68 kg x 0.15 units). Correction (SSI): Please consider ordering a custom Novolog correction scale in which 1 unit drops 50 mg/dl.   Note: Noted patient has had several hypoglycemic events since being admitted to the hospital.  Also noted C-peptide 0.00 ng/ml on 01/21/14 which is indicative that the beta cells are not making any insulin.  Therefore, patient will require basal and bolus insulin for glycemic control.  CBG at 4:32 am today was 333 mg/dl and patient received Novolog 7 units at 4:51 am.  Then CBG at 7:58 am was 62 mg/dl and patient required treatment for hypoglycemia.  Feel that patient is very sensitive to insulin and patient will need basal insulin. Question if Novolog sensitive scale is too much correction. Therefore, recommend ordering Lantus 10 units Q24H starting now and Novolog custom correction scale ACHS (if patent is eating and tolerating diet, if not then use Q4H frequency).  Example of custom correction scale would be as follows: Begin correction at 151 mg/dl  CBG < 150 mg/dl: 0 units 151-200 mg/dl: 1 unit 201-250 mg/dl: 2 units 251-300 mg/dl: 3 units 301-350  mg/dl: 4 units 351-400 mg/dl:  5 units >400 mg/dl:  Call MD and order stat lab glucose  Diabetes coordinator will continue to follow.  Thanks, Barnie Alderman, RN, MSN, CCRN Diabetes Coordinator Inpatient Diabetes Program [phone] (Team Pager) [phone] ([place] office) [phone] [employer] office)

## 2014-01-23 LAB — URINE CULTURE: Colony Count: 40000

## 2014-01-23 LAB — BASIC METABOLIC PANEL
BUN: 12 mg/dL (ref 6–23)
CO2: 23 mEq/L (ref 19–32)
Calcium: 8.4 mg/dL (ref 8.4–10.5)
Chloride: 106 mEq/L (ref 96–112)
Creatinine, Ser: 1.08 mg/dL (ref 0.50–1.35)
GFR calc Af Amer: 79 mL/min — ABNORMAL LOW (ref >90)
GFR calc non Af Amer: 69 mL/min — ABNORMAL LOW (ref >90)
Glucose, Bld: 106 mg/dL — ABNORMAL HIGH (ref 70–99)
Potassium: 3.7 mEq/L (ref 3.7–5.3)
Sodium: 141 mEq/L (ref 137–147)

## 2014-01-23 LAB — GLUCOSE, CAPILLARY
Glucose-Capillary: 562 mg/dL (ref 70–99)
Glucose-Capillary: 146 mg/dL — ABNORMAL HIGH (ref 70–99)
Glucose-Capillary: 102 mg/dL — ABNORMAL HIGH (ref 70–99)
Glucose-Capillary: 317 mg/dL — ABNORMAL HIGH (ref 70–99)
Glucose-Capillary: 280 mg/dL — ABNORMAL HIGH (ref 70–99)
Glucose-Capillary: 367 mg/dL — ABNORMAL HIGH (ref 70–99)
Glucose-Capillary: 400 mg/dL — ABNORMAL HIGH (ref 70–99)

## 2014-01-23 LAB — CBC
HCT: 33.3 % — ABNORMAL LOW (ref 39.0–52.0)
Hemoglobin: 11.5 g/dL — ABNORMAL LOW (ref 13.0–17.0)
MCH: 30.5 pg (ref 26.0–34.0)
MCHC: 34.5 g/dL (ref 30.0–36.0)
MCV: 88.3 fL (ref 78.0–100.0)
Platelets: 182 10*3/uL (ref 150–400)
RBC: 3.77 MIL/uL — ABNORMAL LOW (ref 4.22–5.81)
RDW: 12.9 % (ref 11.5–15.5)
WBC: 7.7 10*3/uL (ref 4.0–10.5)

## 2014-01-23 MED ORDER — INSULIN ASPART 100 UNIT/ML ~~LOC~~ SOLN
1.0000 [IU] | Freq: Three times a day (TID) | SUBCUTANEOUS | Status: DC
  Administered 2014-01-23: 3 [IU] via SUBCUTANEOUS

## 2014-01-23 MED ORDER — HYDROCHLOROTHIAZIDE 12.5 MG PO CAPS
12.5000 mg | ORAL_CAPSULE | Freq: Every day | ORAL | Status: DC
  Administered 2014-01-23 – 2014-01-24 (×2): 12.5 mg via ORAL
  Filled 2014-01-23 (×2): qty 1

## 2014-01-23 MED ORDER — ENOXAPARIN SODIUM 40 MG/0.4ML ~~LOC~~ SOLN
40.0000 mg | SUBCUTANEOUS | Status: DC
  Administered 2014-01-23 – 2014-01-24 (×2): 40 mg via SUBCUTANEOUS
  Filled 2014-01-23 (×3): qty 0.4

## 2014-01-23 MED ORDER — INSULIN ASPART 100 UNIT/ML ~~LOC~~ SOLN
5.0000 [IU] | Freq: Once | SUBCUTANEOUS | Status: AC
  Administered 2014-01-23: 5 [IU] via SUBCUTANEOUS

## 2014-01-23 NOTE — Progress Notes (Signed)
OT Cancellation Note  Patient Details Name: Guerin Velarde MRN: [id number] DOB: 01/18/1946   Cancelled Treatment:    Reason Eval/Treat Not Completed: OT screened, no needs identified, will sign off  [facility], OTR/L  [id number] 01/23/2014 01/23/2014, 9:17 AM

## 2014-01-23 NOTE — Progress Notes (Signed)
[place] TEAM 1 - Stepdown/ICU TEAM Progress Note  [name] [name] [id number] DOB: 1945/12/15 DOA: 01/21/2014 PCP: No primary provider on file.  Admit HPI / Brief Narrative: 68 y.o. male with history of hypertension, diabetes, medical noncompliance, chronic back pain who presented to the ED with seizures and hypoglycemia. Patient was not sure why he was brought to the emergency room and as such history was obtained from ED physician. Per ED physician EMS was called out to patient's house 2 times the night prior to admission. Initially was called around 11 PM as was noted that patient was having hand shaking and then high and low blood sugars throughout the day. Patient was noted to be intoxicated belligerent and refused treatment at that time. Patient states he only took 2 drinks of couvoirser early on that evening.  EMS was subsequently called again around 4 AM due to blood sugar reading of high and patient was given 10 units of NovoLog. Per ED physician family was unsure if patient took his Lantus. EMS reports just prior to arrival patient was noted to become stiff unresponsive and combative with a blood sugar that time of 135. On arrival to the emergency room patient was noted to have another seizure and at that time the CBG had dropped to 83 patient was given half an amp of D50 and 2 mg of Ativan. Per ED physician Chem-7 which was drawn upon arrival revealed a glucose level of 24. Patient did not have any tongue biting or urinary incontinence. Patient was noted to recover from anesthesia and was postictal at that time. In the emergency room lactic level which was obtained was elevated at 7.28. Chem-7 had a potassium of 3 otherwise was within normal limits. CBC was unremarkable. EKG showed a normal sinus rhythm and right atrial enlargement. CT scan of the head was not done. Alcohol level was less than 11. UDS was not obtained.   HPI/Subjective: Pt is feeling better today.  He has no new complaints.   Denies HA, sob, n/v, abdom painj, or cp.   Assessment/Plan:  #1seizures  Questionable etiology. May have been metabolic in nature secondary to hypoglycemia. CT of the head was negative. MRI of the head was negative. EEG showed no epileptiform discharges. Per Neurology anticonvulsant therapy is not indicated at this time. Ativan as needed. Neurology following and appreciate input and recommendations.   #2 hypoglycemia  Likely secondary to exogenous insulin. No signs or symptoms of infection. CBGs have improved, but remain somewhat labile.  CRP 0.00. Cortisol level within normal limits at 21.4. TSH within normal limits. Patient is currently off D5 normal saline and currently on normal saline. Patient has been started on a diet. Monitor and titrate sliding scale as needed.   #3 hypertension  BP poorly controlled at present - meds adjusted - follow trend   #4 hypokalemia  Corrected w/ replacement   #5 lactic acidosis  Likely secondary to problem #1 - resolved  #6 anemia of chronic disease  H&H stable - follow  #7 type 2 diabetes - labile Agree w/ Diabetes Coordinator that pt appears to be very sensitive to insulin tx.  CBG remains quite labile, with both hypoglycemia and hyperglycemia noted over last 24hrs.  Adjust tx regimen today, and begin custom SSI.  Transfer to floor but must see more stable CBG trend prior to d/c home.   Code Status: FULL Family Communication: no family present at time of exam Disposition Plan: transfer to med bed - cont to follow CBG -  home when CBG more stable   Consultants: Neurology  Antibiotics: none  DVT prophylaxis: lovenox  Objective: Blood pressure 155/82, pulse 59, temperature 98.2 F (36.8 C), temperature source Oral, resp. rate 15, height 5\' 9"  (1.753 m), weight 68.4 kg (150 lb 12.7 oz), SpO2 97.00%.  Intake/Output Summary (Last 24 hours) at 01/23/14 1604 Last data filed at 01/23/14 1503  Gross per 24 hour  Intake   1770 ml  Output   2300  ml  Net   -530 ml   Exam: General: No acute respiratory distress Lungs: Clear to auscultation bilaterally without wheezes or crackles Cardiovascular: Regular rate and rhythm without murmur gallop or rub normal S1 and S2 Abdomen: Nontender, nondistended, soft, bowel sounds positive, no rebound, no ascites, no appreciable mass Extremities: No significant cyanosis, clubbing, or edema bilateral lower extremities  Data Reviewed: Basic Metabolic Panel:  Recent Labs Lab 01/21/14 0534 01/21/14 0845 01/21/14 0955 01/21/14 1105 01/21/14 1620 01/22/14 0451 01/23/14 0530  NA 142 141  --  140  --  138 141  K 3.0* 4.1  --  3.7  --  3.9 3.7  CL 106 104  --  105  --  106 106  CO2  --  21  --  20  --  21 23  GLUCOSE 24* 119*  --  96  --  216* 106*  BUN 13 15  --  14  --  10 12  CREATININE 1.10 0.95  --  0.93 0.97 0.95 1.08  CALCIUM  --  9.0  --  8.7  --  8.5 8.4  MG  --   --  2.3  --   --   --   --    Liver Function Tests:  Recent Labs Lab 01/21/14 0955 01/22/14 0451  AST 20 15  ALT 12 11  ALKPHOS 68 65  BILITOT 0.6 0.3  PROT 7.0 6.0  ALBUMIN 3.5 3.0*   CBC:  Recent Labs Lab 01/21/14 0515 01/21/14 0534 01/21/14 1620 01/22/14 0451 01/23/14 0530  WBC 9.0  --  9.6 7.3 7.7  NEUTROABS 7.2  --   --   --   --   HGB 13.9 15.0 12.5* 11.6* 11.5*  HCT 40.9 44.0 36.6* 33.1* 33.3*  MCV 88.7  --  89.5 87.8 88.3  PLT 247  --  198 192 182   CBG:  Recent Labs Lab 01/22/14 1804 01/22/14 2010 01/22/14 2313 01/23/14 0337 01/23/14 1124  GLUCAP 180* 255* 335* 146* 317*    Recent Results (from the past 240 hour(s))  URINE CULTURE     Status: None   Collection Time    01/21/14  5:00 PM      Result Value Ref Range Status   Specimen Description URINE, CLEAN CATCH   Final   Special Requests NONE   Final   Culture  Setup Time     Final   Value: 01/22/2014 02:12     Performed at [facility]     Final   Value: 40,000 COLONIES/ML     Performed at [facility]   Culture     Final   Value: Multiple bacterial morphotypes present, none predominant. Suggest appropriate recollection if clinically indicated.     Performed at [facility]   Report Status 01/23/2014 FINAL   Final  MRSA PCR SCREENING     Status: None   Collection Time    01/21/14  6:45 PM  Result Value Ref Range Status   MRSA by PCR NEGATIVE  NEGATIVE Final   Comment:            The GeneXpert MRSA Assay (FDA     approved for NASAL specimens     only), is one component of a     comprehensive MRSA colonization     surveillance program. It is not     intended to diagnose MRSA     infection nor to guide or     monitor treatment for     MRSA infections.     Studies:  Recent x-ray studies have been reviewed in detail by the Attending Physician  Scheduled Meds:  Scheduled Meds: . docusate sodium  100 mg Oral BID  . enoxaparin (LOVENOX) injection  40 mg Subcutaneous Q24H  . folic acid  1 mg Oral Daily  . insulin aspart  1-5 Units Subcutaneous Q4H  . insulin glargine  10 Units Subcutaneous q morning - 10a  . multivitamin with minerals  1 tablet Oral Daily  . pantoprazole  40 mg Oral Q0600  . ramipril  10 mg Oral q morning - 10a  . sodium chloride  3 mL Intravenous Q12H  . thiamine  100 mg Oral Daily    Time spent on care of this patient: 35 mins   Cherene Altes, MD  [facility] Office  [phone] Pager - Text Page per Shea Evans as per below:  On-Call/Text Page:      [email or web address]      password [email or web address]  If 7PM-7AM, please contact night-coverage [email or web address] Password [email or web address] 01/23/2014, 4:04 PM   LOS: 2 days

## 2014-01-24 DIAGNOSIS — F329 Major depressive disorder, single episode, unspecified: Secondary | ICD-10-CM

## 2014-01-24 DIAGNOSIS — F411 Generalized anxiety disorder: Secondary | ICD-10-CM

## 2014-01-24 DIAGNOSIS — F3289 Other specified depressive episodes: Secondary | ICD-10-CM

## 2014-01-24 DIAGNOSIS — N182 Chronic kidney disease, stage 2 (mild): Secondary | ICD-10-CM

## 2014-01-24 LAB — URINALYSIS, ROUTINE W REFLEX MICROSCOPIC
Bilirubin Urine: NEGATIVE
Glucose, UA: >1000 mg/dL — AB
Hgb urine dipstick: NEGATIVE
Ketones, ur: NEGATIVE mg/dL
Leukocytes, UA: NEGATIVE
Nitrite: NEGATIVE
Protein, ur: NEGATIVE mg/dL
Specific Gravity, Urine: 1.022 (ref 1.005–1.030)
Urobilinogen, UA: 0.2 mg/dL (ref 0.0–1.0)
pH: 6.5 (ref 5.0–8.0)

## 2014-01-24 LAB — GLUCOSE, CAPILLARY
Glucose-Capillary: 91 mg/dL (ref 70–99)
Glucose-Capillary: 438 mg/dL — ABNORMAL HIGH (ref 70–99)
Glucose-Capillary: 290 mg/dL — ABNORMAL HIGH (ref 70–99)
Glucose-Capillary: 163 mg/dL — ABNORMAL HIGH (ref 70–99)

## 2014-01-24 LAB — BASIC METABOLIC PANEL
BUN: 14 mg/dL (ref 6–23)
CO2: 24 mEq/L (ref 19–32)
Calcium: 8.7 mg/dL (ref 8.4–10.5)
Chloride: 100 mEq/L (ref 96–112)
Creatinine, Ser: 1.48 mg/dL — ABNORMAL HIGH (ref 0.50–1.35)
GFR calc Af Amer: 54 mL/min — ABNORMAL LOW (ref >90)
GFR calc non Af Amer: 47 mL/min — ABNORMAL LOW (ref >90)
Glucose, Bld: 311 mg/dL — ABNORMAL HIGH (ref 70–99)
Potassium: 4.2 mEq/L (ref 3.7–5.3)
Sodium: 137 mEq/L (ref 137–147)

## 2014-01-24 LAB — URINE MICROSCOPIC-ADD ON

## 2014-01-24 MED ORDER — INSULIN ASPART 100 UNIT/ML ~~LOC~~ SOLN
0.0000 [IU] | SUBCUTANEOUS | Status: DC
  Administered 2014-01-24 – 2014-01-25 (×3): 3 [IU] via SUBCUTANEOUS
  Administered 2014-01-25: 11 [IU] via SUBCUTANEOUS

## 2014-01-24 MED ORDER — INSULIN GLARGINE 100 UNIT/ML ~~LOC~~ SOLN
5.0000 [IU] | Freq: Once | SUBCUTANEOUS | Status: AC
  Administered 2014-01-24: 5 [IU] via SUBCUTANEOUS
  Filled 2014-01-24: qty 0.05

## 2014-01-24 MED ORDER — HYDROCHLOROTHIAZIDE 25 MG PO TABS
25.0000 mg | ORAL_TABLET | Freq: Every day | ORAL | Status: DC
  Administered 2014-01-24: 25 mg via ORAL
  Filled 2014-01-24 (×2): qty 1

## 2014-01-24 MED ORDER — INSULIN ASPART 100 UNIT/ML ~~LOC~~ SOLN
7.0000 [IU] | Freq: Three times a day (TID) | SUBCUTANEOUS | Status: DC
  Administered 2014-01-24 – 2014-01-25 (×2): 7 [IU] via SUBCUTANEOUS

## 2014-01-24 MED ORDER — INSULIN ASPART 100 UNIT/ML ~~LOC~~ SOLN
7.0000 [IU] | Freq: Once | SUBCUTANEOUS | Status: AC
Start: 2014-01-24 — End: 2014-01-24
  Administered 2014-01-24: 7 [IU] via SUBCUTANEOUS

## 2014-01-24 MED ORDER — FLUOXETINE HCL 20 MG PO CAPS: 20.0000 mg | ORAL_CAPSULE | Freq: Every day | ORAL | Status: DC

## 2014-01-24 MED ORDER — FLUOXETINE HCL 20 MG PO CAPS
20.0000 mg | ORAL_CAPSULE | Freq: Every day | ORAL | Status: DC
  Administered 2014-01-24: 20 mg via ORAL
  Filled 2014-01-24 (×2): qty 1

## 2014-01-24 MED ORDER — INSULIN ASPART 100 UNIT/ML ~~LOC~~ SOLN: 0.0000 [IU] | SUBCUTANEOUS | Status: DC

## 2014-01-24 NOTE — Progress Notes (Addendum)
1807: Attempted to call report, declined by Corky Sing, Network engineer; changing rooms.  1830: Second attempt at report, declined by charge nurse. Will call back shortly. 1835: Report given to charge nurse. Addressed neuro checks and Q4 hour CBG with Sherral Hammers, MD; awaiting response. Pt eating dinner will transfer after finished. All belongings to be sent with patient. VSS. eICU and CCMT notified of transfer. Transferred by RN via wheelchair.

## 2014-01-24 NOTE — Progress Notes (Signed)
Physician notified: Sherral Hammers At: [phone]  Regarding: OK to DC Q4hour neuro checks and Q4 hour CBG? OK for ACHS when transferred to M/S? Awaiting return response.

## 2014-01-24 NOTE — Progress Notes (Addendum)
Physician notified: Otis Dials, PA At: [id number]  Regarding: CBG 438, order states to call if >400 and get stat lab. AM CBG 91- no coverage.  Awaiting return response.   Returned Response at: 1213  Order(s): novolog 7 units x 1 dose. No lab draw.

## 2014-01-24 NOTE — Progress Notes (Signed)
   CARE MANAGEMENT NOTE 01/24/2014  Patient:  [name] [name],[name] [name]   Account Number:  [id number]  Date Initiated:  01/24/2014  Documentation initiated by:  [facility]  Subjective/Objective Assessment:   DM, hypoglycemia, seizures     Action/Plan:   lives with brother   Anticipated DC Date:  01/25/2014   Anticipated DC Plan:  [place]  CM consult      Choice offered to / List presented to:             Status of service:  In process, will continue to follow Medicare Important Message given?   (If response is "NO", the following Medicare IM given date fields will be blank) Date Medicare IM given:   Date Additional Medicare IM given:    Discharge Disposition:    Per UR Regulation:    If discussed at Long Length of Stay Meetings, dates discussed:    Comments:  01/24/2014 1120 NCM spoke to pt and states he is able to afford his medications and he checks his blood sugars. He knows the signs of hypoglycemia and has snack available when he goes out. NCM discussed medical alert bracelet. NCM will continue to follow for dc needs. Will assess for possible [facility] RN for disease mgmt or [facility].   Jonnie Finner RN CCM Case Mgmt phone [phone]

## 2014-01-24 NOTE — Progress Notes (Signed)
Subjective:  No further seizures.  No complaints of pain.    Objective: Current vital signs: BP 162/74  Pulse 52  Temp(Src) 99.1 F (37.3 C) (Oral)  Resp 20  Ht 5\' 9"  (1.753 m)  Wt 68.2 kg (150 lb 5.7 oz)  BMI 22.19 kg/m2  SpO2 100% Vital signs in last 24 hours: Temp:  [98.2 F (36.8 C)-99.1 F (37.3 C)] 99.1 F (37.3 C) (04/14 0700) Pulse Rate:  [52-68] 52 (04/14 0716) Resp:  [15-22] 20 (04/14 0716) BP: (150-173)/(70-85) 162/74 mmHg (04/14 0716) SpO2:  [97 %-100 %] 100 % (04/14 0335) Weight:  [68.2 kg (150 lb 5.7 oz)] 68.2 kg (150 lb 5.7 oz) (04/14 0340)  Intake/Output from previous day: 04/13 0701 - 04/14 0700 In: 1830 [P.O.:960; I.V.:870] Out: 700 [Urine:700] Intake/Output this shift: Total I/O In: -  Out: 300 [Urine:300] Nutritional status: Carb Control  Neurologic Exam: Mental Status:  Alert, oriented, thought content appropriate. Speech fluent without evidence of aphasia. Able to follow 3 step commands without difficulty.  Cranial Nerves:  II: Discs flat bilaterally; Visual fields grossly normal, pupils equal, round, reactive to light and accommodation  III,IV, VI: ptosis not present, extra-ocular motions intact bilaterally  V,VII: smile symmetric, facial light touch sensation normal bilaterally  VIII: hearing normal bilaterally  IX,X: gag reflex present  XI: bilateral shoulder shrug  XII: midline tongue extension  Motor:  5/5 in both UE and LE  Tone and bulk:normal tone throughout; no atrophy noted  Sensory: Pinprick and light touch intact throughout, bilaterally  Deep Tendon Reflexes: 2+ in the upper extremities, 1+KJ and no AJ  Plantars:  Right: downgoing   Left: downgoing  Cerebellar:  normal finger-to-nose and normal heel-to-shin test      Lab Results: Basic Metabolic Panel:  Recent Labs Lab 01/21/14 0534  01/21/14 0845 01/21/14 0955 01/21/14 1105 01/21/14 1620 01/22/14 0451 01/23/14 0530  NA 142  --  141  --  140  --  138 141  K  3.0*  --  4.1  --  3.7  --  3.9 3.7  CL 106  --  104  --  105  --  106 106  CO2  --   --  21  --  20  --  21 23  GLUCOSE 24*  --  119*  --  96  --  216* 106*  BUN 13  --  15  --  14  --  10 12  CREATININE 1.10  --  0.95  --  0.93 0.97 0.95 1.08  CALCIUM  --   < > 9.0  --  8.7  --  8.5 8.4  MG  --   --   --  2.3  --   --   --   --   < > = values in this interval not displayed.  Liver Function Tests:  Recent Labs Lab 01/21/14 0955 01/22/14 0451  AST 20 15  ALT 12 11  ALKPHOS 68 65  BILITOT 0.6 0.3  PROT 7.0 6.0  ALBUMIN 3.5 3.0*   No results found for this basename: LIPASE, AMYLASE,  in the last 168 hours No results found for this basename: AMMONIA,  in the last 168 hours  CBC:  Recent Labs Lab 01/21/14 0515 01/21/14 0534 01/21/14 1620 01/22/14 0451 01/23/14 0530  WBC 9.0  --  9.6 7.3 7.7  NEUTROABS 7.2  --   --   --   --   HGB 13.9 15.0 12.5* 11.6* 11.5*  HCT 40.9 44.0 36.6* 33.1* 33.3*  MCV 88.7  --  89.5 87.8 88.3  PLT 247  --  198 192 182    Cardiac Enzymes: No results found for this basename: CKTOTAL, CKMB, CKMBINDEX, TROPONINI,  in the last 168 hours  Lipid Panel: No results found for this basename: CHOL, TRIG, HDL, CHOLHDL, VLDL, LDLCALC,  in the last 168 hours  CBG:  Recent Labs Lab 01/23/14 1124 01/23/14 1505 01/23/14 2001 01/23/14 2126 01/24/14 0743  GLUCAP 317* 280* 367* 400* 91    Microbiology: Results for orders placed during the hospital encounter of 01/21/14  URINE CULTURE     Status: None   Collection Time    01/21/14  5:00 PM      Result Value Ref Range Status   Specimen Description URINE, CLEAN CATCH   Final   Special Requests NONE   Final   Culture  Setup Time     Final   Value: 01/22/2014 02:12     Performed at [facility]     Final   Value: 40,000 COLONIES/ML     Performed at [facility]   Culture     Final   Value: Multiple bacterial morphotypes present, none predominant. Suggest  appropriate recollection if clinically indicated.     Performed at [facility]   Report Status 01/23/2014 FINAL   Final  MRSA PCR SCREENING     Status: None   Collection Time    01/21/14  6:45 PM      Result Value Ref Range Status   MRSA by PCR NEGATIVE  NEGATIVE Final   Comment:            The GeneXpert MRSA Assay (FDA     approved for NASAL specimens     only), is one component of a     comprehensive MRSA colonization     surveillance program. It is not     intended to diagnose MRSA     infection nor to guide or     monitor treatment for     MRSA infections.    Coagulation Studies:  Recent Labs  01/21/14 1620  LABPROT 13.2  INR 1.02    Imaging: No results found.  Medications:  Scheduled: . docusate sodium  100 mg Oral BID  . enoxaparin (LOVENOX) injection  40 mg Subcutaneous Q24H  . folic acid  1 mg Oral Daily  . hydrochlorothiazide  12.5 mg Oral Daily  . insulin aspart  1-5 Units Subcutaneous TID WC  . insulin glargine  10 Units Subcutaneous q morning - 10a  . multivitamin with minerals  1 tablet Oral Daily  . pantoprazole  40 mg Oral Q0600  . ramipril  10 mg Oral q morning - 10a  . sodium chloride  3 mL Intravenous Q12H  . thiamine  100 mg Oral Daily    Assessment/Plan:  No further seizures noted over night. MRI of the brain reviewed and although without contrast shows no evidence of acute changes.  EEG unremarkable. Seizures likely provoked.   Recommendations:  1. Antiepileptic therapy not indicated at this time.  2. No further neurologic intervention is recommended at this time.  3. No driving, operating heavy machinery, perform activities at heights, swimming or participation in water activities until release by outpatient physician.  This has been discussed with patient.

## 2014-01-24 NOTE — Progress Notes (Signed)
Inpatient Diabetes Program Recommendations  AACE/ADA: New Consensus Statement on Inpatient Glycemic Control (2013)  Target Ranges:  Prepandial:   less than 140 mg/dL      Peak postprandial:   less than 180 mg/dL (1-2 hours)      Critically ill patients:  140 - 180 mg/dL   Reason for Visit: Hyperglycemia  Results for JESSI, VERGE (MRN [id number]) as of 01/24/2014 09:51  Ref. Range 01/23/2014 03:37 01/23/2014 07:33 01/23/2014 11:24 01/23/2014 15:05 01/23/2014 20:01 01/23/2014 21:26 01/24/2014 07:43  Glucose-Capillary Latest Range: 70-99 mg/dL 146 (H) 102 (H) 317 (H) 280 (H) 367 (H) 400 (H) 91   Blood sugars normal in am and continues to increase throughout the day. Needs meal coverage insulin.  Inpatient Diabetes Program Recommendations Insulin - Basal: Please consider ordering Lantus 10 units Q24H starting now (based on 68 kg x 0.15 units). Correction (SSI): Please consider ordering a custom Novolog correction scale in which 1 unit drops 50 mg/dl.   Note: Will continue to follow.  Thank you. Lorenda Peck, RD, LDN, CDE Inpatient Diabetes Coordinator [phone]

## 2014-01-24 NOTE — Progress Notes (Signed)
ICU/[facility] PROGRESS NOTE  [name] [name] [id number] DOB: Oct 27, 1945 DOA: 01/21/2014 PCP: No primary provider on file.       Principal Problem:   Seizure Active Problems:   DIABETES MELLITUS, TYPE II   CKD (chronic kidney disease) stage 2, GFR 60-89 ml/min   Anemia of chronic disease   HTN (hypertension)   Hypoglycemia   Hypokalemia   Lactic acidosis   Seizures      VITAL SIGNS:  Temp: 37.3 Pulse Rate: 52 Resp: 20 BP: 162/74 SpO2: 100% on room air FiO2 (%):   Ventilator settings  Mode  Rate  Tidal Volume  FiO2  PO2/ FIO2  PIP  Plateau      Assessment/Plan: Neuro 1. Seizures -Secondary to hypoglycemia, per neurology recommendations negative anti-epileptic therapy recommended at this time.  Psychiatry 1. Anxiety -Continue Xanax 2 mg BID anxiety  2. Depression -Start Prozac 20 mg (home dose)   Resp   CVS  1.HTN -BP still not within ADA/AHA guidelines; patient on suboptimal treatment. -Literature shows Ramipril  has decreased efficacy in black patients, and patient is at max dose.  -Start HCTZ 25 mg daily  GI   Renal balance today;        /overall;        Creatinine ;        Hourly output     Endocrine 1.Diabetes mellitus type 2 -Hemoglobin A1c 01/21/2014= 8.1 -Increase patient's Lantus to 15 units, which is patient's home dose. -Start NovoLog 7 units with meals -Continue moderate SSI. -On discharge WOULD NOT restart patient's home regimen of Lantus + NovoLog 70/30 which is the most likely cause of his multiple hypoglycemic episodes.  Extremeties  Heme/labs  ID    Code Status: Full Family Communication: None Disposition Plan: Stabilization of patient's blood sugars    Devices   LINES / TUBES:        Consultants: Dr. Alexis Goodell (neurology)   Procedures/SIGNIFICANT EVENTS: MRI brain without contrast 01/21/2014 Patient without IV access and therefore performed without contrast.  No acute  infarct.  No evidence of mesial temporal sclerosis.  No intracranial hemorrhage.  Moderate small vessel disease type changes.  Global atrophy without hydrocephalus.  Cervical spondylotic changes with spinal stenosis and mild cord flattening C3-4 and less so C5-6.  Partially empty sella.  CT head without contrast 01/21/2014 mild atrophy and microvascular ischemic disease without acute intracranial process.    CULTURES:      Antibiotics:    Continuous Infusions: . sodium chloride 10 mL (01/23/14 1624)      HPI/Subjective: 68 y.o. BM PMHx anxiety, depression, hypertension, diabetes, medical noncompliance, chronic back pain who presented to the ED with seizures and hypoglycemia. Patient was not sure why he was brought to the emergency room and as such history was obtained from ED physician. Per ED physician EMS was called out to patient's house 2 times the night prior to admission. Initially was called around 11 PM as was noted that patient was having hand shaking and then high and low blood sugars throughout the day. Patient was noted to be intoxicated belligerent and refused treatment at that time. Patient states he only took 2 drinks of couvoirser early on that evening.  EMS was subsequently called again around 4 AM due to blood sugar reading of high and patient was given 10 units of NovoLog. Per ED physician family was unsure if patient took his Lantus. EMS reports just prior to arrival patient was noted to become stiff unresponsive  and combative with a blood sugar that time of 135. On arrival to the emergency room patient was noted to have another seizure and at that time the CBG had dropped to 83 patient was given half an amp of D50 and 2 mg of Ativan. Per ED physician Chem-7 which was drawn upon arrival revealed a glucose level of 24. Patient did not have any tongue biting or urinary incontinence. Patient was noted to recover from anesthesia and was postictal at that time. In the  emergency room lactic level which was obtained was elevated at 7.28. Chem-7 had a potassium of 3 otherwise was within normal limits. CBC was unremarkable. EKG showed a normal sinus rhythm and right atrial enlargement. CT scan of the head was not done. Alcohol level was less than 11. UDS was not obtained. 4/14 patient states that he sees physician in [facility] for his diabetic control. That he has had low sugars before as low as 13 and he would crawl up the stairs and just drink orange juice    Exam:   General:  A./O. x4, NAD  Cardiovascular: Regular rhythm and rate, negative murmurs rubs or gallops  Respiratory: Clear to auscultation bilateral  Abdomen: Soft, nontender, nondistended, plus bowel sound  Musculoskeletal: Negative pedal edema      Data Reviewed: Basic Metabolic Panel:  Recent Labs Lab 01/21/14 0534 01/21/14 0845 01/21/14 0955 01/21/14 1105 01/21/14 1620 01/22/14 0451 01/23/14 0530  NA 142 141  --  140  --  138 141  K 3.0* 4.1  --  3.7  --  3.9 3.7  CL 106 104  --  105  --  106 106  CO2  --  21  --  20  --  21 23  GLUCOSE 24* 119*  --  96  --  216* 106*  BUN 13 15  --  14  --  10 12  CREATININE 1.10 0.95  --  0.93 0.97 0.95 1.08  CALCIUM  --  9.0  --  8.7  --  8.5 8.4  MG  --   --  2.3  --   --   --   --    Liver Function Tests:  Recent Labs Lab 01/21/14 0955 01/22/14 0451  AST 20 15  ALT 12 11  ALKPHOS 68 65  BILITOT 0.6 0.3  PROT 7.0 6.0  ALBUMIN 3.5 3.0*   No results found for this basename: LIPASE, AMYLASE,  in the last 168 hours No results found for this basename: AMMONIA,  in the last 168 hours CBC:  Recent Labs Lab 01/21/14 0515 01/21/14 0534 01/21/14 1620 01/22/14 0451 01/23/14 0530  WBC 9.0  --  9.6 7.3 7.7  NEUTROABS 7.2  --   --   --   --   HGB 13.9 15.0 12.5* 11.6* 11.5*  HCT 40.9 44.0 36.6* 33.1* 33.3*  MCV 88.7  --  89.5 87.8 88.3  PLT 247  --  198 192 182   Cardiac Enzymes: No results found for this basename:  CKTOTAL, CKMB, CKMBINDEX, TROPONINI,  in the last 168 hours BNP (last 3 results) No results found for this basename: PROBNP,  in the last 8760 hours CBG:  Recent Labs Lab 01/23/14 1505 01/23/14 2001 01/23/14 2126 01/24/14 0743 01/24/14 1158  GLUCAP 280* 367* 400* 91 438*    Recent Results (from the past 240 hour(s))  URINE CULTURE     Status: None   Collection Time    01/21/14  5:00 PM  Result Value Ref Range Status   Specimen Description URINE, CLEAN CATCH   Final   Special Requests NONE   Final   Culture  Setup Time     Final   Value: 01/22/2014 02:12     Performed at [facility] Count     Final   Value: 40,000 COLONIES/ML     Performed at [facility]   Culture     Final   Value: Multiple bacterial morphotypes present, none predominant. Suggest appropriate recollection if clinically indicated.     Performed at [facility]   Report Status 01/23/2014 FINAL   Final  MRSA PCR SCREENING     Status: None   Collection Time    01/21/14  6:45 PM      Result Value Ref Range Status   MRSA by PCR NEGATIVE  NEGATIVE Final   Comment:            The GeneXpert MRSA Assay (FDA     approved for NASAL specimens     only), is one component of a     comprehensive MRSA colonization     surveillance program. It is not     intended to diagnose MRSA     infection nor to guide or     monitor treatment for     MRSA infections.     No results found.  Scheduled Meds: . docusate sodium  100 mg Oral BID  . enoxaparin (LOVENOX) injection  40 mg Subcutaneous Q24H  . folic acid  1 mg Oral Daily  . hydrochlorothiazide  12.5 mg Oral Daily  . insulin aspart  1-5 Units Subcutaneous TID WC  . insulin glargine  10 Units Subcutaneous q morning - 10a  . multivitamin with minerals  1 tablet Oral Daily  . pantoprazole  40 mg Oral Q0600  . ramipril  10 mg Oral q morning - 10a  . sodium chloride  3 mL Intravenous Q12H  . thiamine  100 mg Oral Daily         Time spent: 40 minutes    [facility] Pager [phone]. If 7PM-7AM, please contact night-coverage at [email or web address], password [facility] 01/24/2014, 3:16 PM  LOS: 3 days

## 2014-01-25 ENCOUNTER — Encounter: Payer: Self-pay | Admitting: Internal Medicine

## 2014-01-25 LAB — GLUCOSE, CAPILLARY
Glucose-Capillary: 108 mg/dL — ABNORMAL HIGH (ref 70–99)
Glucose-Capillary: 158 mg/dL — ABNORMAL HIGH (ref 70–99)
Glucose-Capillary: 345 mg/dL — ABNORMAL HIGH (ref 70–99)
Glucose-Capillary: 45 mg/dL — ABNORMAL LOW (ref 70–99)

## 2014-01-25 NOTE — Progress Notes (Signed)
Patient expresses concerns that he would like to leave the hospital without medical advice. Writer explained the risks of leaving AMA. Patient verbalized that he doesn't care and there is nothing that we can do for him here that he can't do for himself at home. Dr. Wyline Copas at bedside. [place] pager signed.   Ave Filter, RN

## 2014-01-26 LAB — GLUCOSE, CAPILLARY: Glucose-Capillary: 31 mg/dL — CL (ref 70–99)

## 2014-02-02 LAB — SULFONYLUREA HYPOGLYCEMICS PANEL, URINE
Acetohexamide, ur: NEGATIVE ug/mL
Chlorpropamide, ur: NEGATIVE ug/mL
Glimepiride, ur: NEGATIVE ng/mL
Glipizide, ur: NEGATIVE ng/mL
Glyburide, ur: NEGATIVE ng/mL
Nateglinide, ur: NEGATIVE ng/mL
Repaglinide, ur: NEGATIVE ng/mL
Tolazamide: NEGATIVE ug/mL
Tolbutamide, ur: NEGATIVE ug/mL

## 2014-03-18 MED ORDER — LIDOCAINE HCL (CARDIAC) 20 MG/ML IV SOLN
INTRAVENOUS | Status: AC
  Filled 2014-03-18: qty 5

## 2014-03-18 MED ORDER — ROCURONIUM BROMIDE 50 MG/5ML IV SOLN
INTRAVENOUS | Status: AC
Start: 2014-03-18 — End: 2014-03-19
  Filled 2014-03-18: qty 2

## 2014-03-18 MED ORDER — ETOMIDATE 2 MG/ML IV SOLN
INTRAVENOUS | Status: AC
  Filled 2014-03-18: qty 20

## 2014-03-18 MED ORDER — SUCCINYLCHOLINE CHLORIDE 20 MG/ML IJ SOLN
INTRAMUSCULAR | Status: AC
  Filled 2014-03-18: qty 1

## 2014-03-19 ENCOUNTER — Other Ambulatory Visit: Payer: Self-pay

## 2014-03-19 ENCOUNTER — Inpatient Hospital Stay (HOSPITAL_COMMUNITY): Payer: PRIVATE HEALTH INSURANCE

## 2014-03-19 ENCOUNTER — Inpatient Hospital Stay (HOSPITAL_COMMUNITY)
Admission: EM | Admit: 2014-03-19 | Discharge: 2014-03-22 | DRG: 638 | Disposition: A | Discharge status: Home / Self Care | Payer: PRIVATE HEALTH INSURANCE | Attending: Internal Medicine | Admitting: Internal Medicine

## 2014-03-19 ENCOUNTER — Emergency Department (HOSPITAL_COMMUNITY): Payer: PRIVATE HEALTH INSURANCE

## 2014-03-19 ENCOUNTER — Encounter (HOSPITAL_COMMUNITY): Payer: Self-pay | Admitting: Emergency Medicine

## 2014-03-19 DIAGNOSIS — E119 Type 2 diabetes mellitus without complications: Secondary | ICD-10-CM

## 2014-03-19 DIAGNOSIS — E871 Hypo-osmolality and hyponatremia: Secondary | ICD-10-CM | POA: Diagnosis present

## 2014-03-19 DIAGNOSIS — Z87891 Personal history of nicotine dependence: Secondary | ICD-10-CM

## 2014-03-19 DIAGNOSIS — E876 Hypokalemia: Secondary | ICD-10-CM | POA: Diagnosis present

## 2014-03-19 DIAGNOSIS — Z794 Long term (current) use of insulin: Secondary | ICD-10-CM

## 2014-03-19 DIAGNOSIS — Z91199 Patient's noncompliance with other medical treatment and regimen due to unspecified reason: Secondary | ICD-10-CM

## 2014-03-19 DIAGNOSIS — G8929 Other chronic pain: Secondary | ICD-10-CM | POA: Diagnosis present

## 2014-03-19 DIAGNOSIS — E1010 Type 1 diabetes mellitus with ketoacidosis without coma: Secondary | ICD-10-CM | POA: Diagnosis not present

## 2014-03-19 DIAGNOSIS — N179 Acute kidney failure, unspecified: Secondary | ICD-10-CM | POA: Diagnosis present

## 2014-03-19 DIAGNOSIS — R3989 Other symptoms and signs involving the genitourinary system: Secondary | ICD-10-CM | POA: Diagnosis present

## 2014-03-19 DIAGNOSIS — I10 Essential (primary) hypertension: Secondary | ICD-10-CM

## 2014-03-19 DIAGNOSIS — E1110 Type 2 diabetes mellitus with ketoacidosis without coma: Secondary | ICD-10-CM

## 2014-03-19 DIAGNOSIS — N189 Chronic kidney disease, unspecified: Secondary | ICD-10-CM

## 2014-03-19 DIAGNOSIS — I4460 Unspecified fascicular block: Secondary | ICD-10-CM | POA: Diagnosis present

## 2014-03-19 DIAGNOSIS — Z96649 Presence of unspecified artificial hip joint: Secondary | ICD-10-CM | POA: Diagnosis not present

## 2014-03-19 DIAGNOSIS — R4182 Altered mental status, unspecified: Secondary | ICD-10-CM | POA: Diagnosis present

## 2014-03-19 DIAGNOSIS — I959 Hypotension, unspecified: Secondary | ICD-10-CM | POA: Diagnosis present

## 2014-03-19 DIAGNOSIS — R739 Hyperglycemia, unspecified: Secondary | ICD-10-CM

## 2014-03-19 DIAGNOSIS — Z79899 Other long term (current) drug therapy: Secondary | ICD-10-CM | POA: Diagnosis not present

## 2014-03-19 DIAGNOSIS — I129 Hypertensive chronic kidney disease with stage 1 through stage 4 chronic kidney disease, or unspecified chronic kidney disease: Secondary | ICD-10-CM | POA: Diagnosis present

## 2014-03-19 DIAGNOSIS — N183 Chronic kidney disease, stage 3 unspecified: Secondary | ICD-10-CM | POA: Diagnosis present

## 2014-03-19 DIAGNOSIS — I509 Heart failure, unspecified: Secondary | ICD-10-CM | POA: Diagnosis not present

## 2014-03-19 DIAGNOSIS — G9340 Encephalopathy, unspecified: Secondary | ICD-10-CM

## 2014-03-19 DIAGNOSIS — Z9119 Patient's noncompliance with other medical treatment and regimen: Secondary | ICD-10-CM

## 2014-03-19 LAB — COMPREHENSIVE METABOLIC PANEL
ALT: 12 U/L (ref 0–53)
AST: 14 U/L (ref 0–37)
Albumin: 3.3 g/dL — ABNORMAL LOW (ref 3.5–5.2)
Alkaline Phosphatase: 96 U/L (ref 39–117)
BUN: 56 mg/dL — ABNORMAL HIGH (ref 6–23)
CO2: 10 mEq/L — CL (ref 19–32)
Calcium: 8.4 mg/dL (ref 8.4–10.5)
Chloride: 81 mEq/L — ABNORMAL LOW (ref 96–112)
Creatinine, Ser: 3.85 mg/dL — ABNORMAL HIGH (ref 0.50–1.35)
GFR calc Af Amer: 17 mL/min — ABNORMAL LOW (ref >90)
GFR calc non Af Amer: 15 mL/min — ABNORMAL LOW (ref >90)
Glucose, Bld: 1408 mg/dL (ref 70–99)
Potassium: 6.6 mEq/L (ref 3.7–5.3)
Sodium: 126 mEq/L — ABNORMAL LOW (ref 137–147)
Total Bilirubin: 0.5 mg/dL (ref 0.3–1.2)
Total Protein: 6.6 g/dL (ref 6.0–8.3)

## 2014-03-19 LAB — BASIC METABOLIC PANEL
BUN: 50 mg/dL — ABNORMAL HIGH (ref 6–23)
BUN: 54 mg/dL — ABNORMAL HIGH (ref 6–23)
BUN: 47 mg/dL — ABNORMAL HIGH (ref 6–23)
CO2: 13 mEq/L — ABNORMAL LOW (ref 19–32)
CO2: 14 mEq/L — ABNORMAL LOW (ref 19–32)
CO2: 17 mEq/L — ABNORMAL LOW (ref 19–32)
Calcium: 7.7 mg/dL — ABNORMAL LOW (ref 8.4–10.5)
Calcium: 7.9 mg/dL — ABNORMAL LOW (ref 8.4–10.5)
Calcium: 7.7 mg/dL — ABNORMAL LOW (ref 8.4–10.5)
Chloride: 91 mEq/L — ABNORMAL LOW (ref 96–112)
Chloride: 97 mEq/L (ref 96–112)
Chloride: 102 mEq/L (ref 96–112)
Creatinine, Ser: 3.38 mg/dL — ABNORMAL HIGH (ref 0.50–1.35)
Creatinine, Ser: 3.53 mg/dL — ABNORMAL HIGH (ref 0.50–1.35)
Creatinine, Ser: 3.17 mg/dL — ABNORMAL HIGH (ref 0.50–1.35)
GFR calc Af Amer: 19 mL/min — ABNORMAL LOW (ref >90)
GFR calc Af Amer: 20 mL/min — ABNORMAL LOW (ref >90)
GFR calc Af Amer: 21 mL/min — ABNORMAL LOW (ref >90)
GFR calc non Af Amer: 16 mL/min — ABNORMAL LOW (ref >90)
GFR calc non Af Amer: 17 mL/min — ABNORMAL LOW (ref >90)
GFR calc non Af Amer: 19 mL/min — ABNORMAL LOW (ref >90)
Glucose, Bld: 1170 mg/dL (ref 70–99)
Glucose, Bld: 944 mg/dL (ref 70–99)
Glucose, Bld: 732 mg/dL (ref 70–99)
Potassium: 4.7 mEq/L (ref 3.7–5.3)
Potassium: 5.5 mEq/L — ABNORMAL HIGH (ref 3.7–5.3)
Potassium: 4 mEq/L (ref 3.7–5.3)
Sodium: 130 mEq/L — ABNORMAL LOW (ref 137–147)
Sodium: 133 mEq/L — ABNORMAL LOW (ref 137–147)
Sodium: 137 mEq/L (ref 137–147)

## 2014-03-19 LAB — BLOOD GAS, VENOUS
Acid-base deficit: 15.7 mmol/L — ABNORMAL HIGH (ref 0.0–2.0)
Bicarbonate: 10.2 mEq/L — ABNORMAL LOW (ref 20.0–24.0)
FIO2: 0.21 %
O2 Saturation: 87.1 %
Patient temperature: 98.6
TCO2: 9.7 mmol/L (ref 0–100)
pCO2, Ven: 24.4 mmHg — ABNORMAL LOW (ref 45.0–50.0)
pH, Ven: 7.243 — ABNORMAL LOW (ref 7.250–7.300)
pO2, Ven: 55.1 mmHg — ABNORMAL HIGH (ref 30.0–45.0)

## 2014-03-19 LAB — CBG MONITORING, ED
Glucose-Capillary: >600 mg/dL (ref 70–99)
Glucose-Capillary: 459 mg/dL — ABNORMAL HIGH (ref 70–99)
Glucose-Capillary: >600 mg/dL (ref 70–99)

## 2014-03-19 LAB — URINALYSIS, ROUTINE W REFLEX MICROSCOPIC
Bilirubin Urine: NEGATIVE
Glucose, UA: >1000 mg/dL — AB
Ketones, ur: 15 mg/dL — AB
Leukocytes, UA: NEGATIVE
Nitrite: NEGATIVE
Protein, ur: 30 mg/dL — AB
Specific Gravity, Urine: 1.021 (ref 1.005–1.030)
Urobilinogen, UA: 0.2 mg/dL (ref 0.0–1.0)
pH: 5 (ref 5.0–8.0)

## 2014-03-19 LAB — GLUCOSE, CAPILLARY
Glucose-Capillary: >600 mg/dL (ref 70–99)
Glucose-Capillary: >600 mg/dL (ref 70–99)

## 2014-03-19 LAB — CBC
HCT: 35.2 % — ABNORMAL LOW (ref 39.0–52.0)
Hemoglobin: 10.1 g/dL — ABNORMAL LOW (ref 13.0–17.0)
MCH: 29.9 pg (ref 26.0–34.0)
MCHC: 28.7 g/dL — ABNORMAL LOW (ref 30.0–36.0)
MCV: 104.1 fL — ABNORMAL HIGH (ref 78.0–100.0)
Platelets: 227 10*3/uL (ref 150–400)
RBC: 3.38 MIL/uL — ABNORMAL LOW (ref 4.22–5.81)
RDW: 13.9 % (ref 11.5–15.5)
WBC: 11.8 10*3/uL — ABNORMAL HIGH (ref 4.0–10.5)

## 2014-03-19 LAB — RAPID URINE DRUG SCREEN, HOSP PERFORMED
Amphetamines: NOT DETECTED
Barbiturates: NOT DETECTED
Benzodiazepines: NOT DETECTED
Cocaine: NOT DETECTED
Opiates: POSITIVE — AB
Tetrahydrocannabinol: NOT DETECTED

## 2014-03-19 LAB — TROPONIN I: Troponin I: <0.3 ng/mL (ref <0.30)

## 2014-03-19 LAB — CK: Total CK: 157 U/L (ref 7–232)

## 2014-03-19 LAB — ETHANOL: Alcohol, Ethyl (B): <11 mg/dL (ref 0–11)

## 2014-03-19 LAB — URINE MICROSCOPIC-ADD ON

## 2014-03-19 LAB — SALICYLATE LEVEL: Salicylate Lvl: <2 mg/dL — ABNORMAL LOW (ref 2.8–20.0)

## 2014-03-19 LAB — I-STAT CG4 LACTIC ACID, ED: Lactic Acid, Venous: 2.31 mmol/L — ABNORMAL HIGH (ref 0.5–2.2)

## 2014-03-19 LAB — ACETAMINOPHEN LEVEL: Acetaminophen (Tylenol), Serum: <15 ug/mL (ref 10–30)

## 2014-03-19 MED ORDER — SODIUM CHLORIDE 0.9 % IV SOLN
Freq: Once | INTRAVENOUS | Status: AC
  Administered 2014-03-20: 999 mL/h via INTRAVENOUS

## 2014-03-19 MED ORDER — SODIUM CHLORIDE 0.9 % IV SOLN
INTRAVENOUS | Status: DC
  Administered 2014-03-19: 21:00:00 via INTRAVENOUS

## 2014-03-19 MED ORDER — DEXTROSE 50 % IV SOLN: 25.0000 mL | INTRAVENOUS | Status: DC | PRN

## 2014-03-19 MED ORDER — INSULIN REGULAR HUMAN 100 UNIT/ML IJ SOLN
INTRAMUSCULAR | Status: DC
  Administered 2014-03-20: 6.5 [IU]/h via INTRAVENOUS
  Filled 2014-03-19 (×2): qty 1

## 2014-03-19 MED ORDER — DEXTROSE-NACL 5-0.45 % IV SOLN: INTRAVENOUS | Status: DC

## 2014-03-19 MED ORDER — SODIUM CHLORIDE 0.9 % IV BOLUS (SEPSIS)
1000.0000 mL | Freq: Once | INTRAVENOUS | Status: AC
  Administered 2014-03-19: 1000 mL via INTRAVENOUS

## 2014-03-19 MED ORDER — INSULIN REGULAR BOLUS VIA INFUSION
0.0000 [IU] | Freq: Three times a day (TID) | INTRAVENOUS | Status: DC
Start: 2014-03-20 — End: 2014-03-19
  Filled 2014-03-19: qty 10

## 2014-03-19 MED ORDER — SODIUM CHLORIDE 0.9 % IV SOLN
INTRAVENOUS | Status: DC
  Administered 2014-03-19 (×2): via INTRAVENOUS

## 2014-03-19 MED ORDER — DEXTROSE-NACL 5-0.45 % IV SOLN
INTRAVENOUS | Status: DC
  Administered 2014-03-20: 125 mL/h via INTRAVENOUS

## 2014-03-19 MED ORDER — SODIUM CHLORIDE 0.9 % IV SOLN
INTRAVENOUS | Status: DC
  Administered 2014-03-19: 4 [IU]/h via INTRAVENOUS
  Administered 2014-03-19: 10.8 [IU]/h via INTRAVENOUS
  Filled 2014-03-19: qty 1

## 2014-03-19 MED ORDER — POTASSIUM CHLORIDE 10 MEQ/100ML IV SOLN
10.0000 meq | INTRAVENOUS | Status: AC
  Administered 2014-03-20 (×2): 10 meq via INTRAVENOUS
  Filled 2014-03-19 (×2): qty 100

## 2014-03-19 MED ORDER — SODIUM CHLORIDE 0.9 % IV SOLN
INTRAVENOUS | Status: DC
  Administered 2014-03-21: 1000 mL via INTRAVENOUS

## 2014-03-19 MED ORDER — ENOXAPARIN SODIUM 30 MG/0.3ML ~~LOC~~ SOLN
30.0000 mg | Freq: Every day | SUBCUTANEOUS | Status: DC
  Administered 2014-03-20 – 2014-03-21 (×3): 30 mg via SUBCUTANEOUS
  Filled 2014-03-19 (×4): qty 0.3

## 2014-03-19 MED ORDER — ENOXAPARIN SODIUM 40 MG/0.4ML ~~LOC~~ SOLN: 40.0000 mg | SUBCUTANEOUS | Status: DC

## 2014-03-19 NOTE — H&P (Addendum)
[facility] History and Physical  [name] [name] [id number] DOB: 10/06/1946 DOA: 03/19/2014  Referring physician: ER physician. PCP: No primary provider on file.   Chief Complaint: Confusion.  HPI: [name] [name] is a 68 y.o. male with history of diabetes mellitus, hypertension chronic pain was brought to the ER after patient's family found that patient was confused. As per the nurse in the ER physician with whom we obtained the history as I was unable to reach family, patient was found to be confused at his house in his yard. Patient was brought to the ER and was found to be mildly febrile and blood sugar was found to be around 1400 with anion gap. Patient's family states that he has not been taking his insulin at least for one day. CT head did not show anything acute. Patient was on IV insulin and fluid. On my exam patient presently is more oriented but still lethargic and states he has not taken his insulin for one day. Denies any chest pain shortness of breath nausea vomiting abdominal pain or diarrhea. Patient has had been admitted previously for hypoglycemic seizures.   Review of Systems: As presented in the history of presenting illness, rest negative.  Past Medical History  Diagnosis Date  . Diabetes mellitus without complication   . Hypertension   . Hernia   . Back pain    Past Surgical History  Procedure Laterality Date  . No past surgeries    . Hip arthroplasty Right 02/05/2013    Procedure: ARTHROPLASTY BIPOLAR HIP;  Surgeon: [name], MD;  Location: [facility];  Service: Orthopedics;  Laterality: Right;   Social History:  reports that he quit smoking about 13 months ago. His smoking use included Cigarettes. He has a 7.5 pack-year smoking history. He has never used smokeless tobacco. He reports that he drinks alcohol. He reports that he does not use illicit drugs. Where does patient live home. Can patient participate in ADLs? Yes.  No Known Allergies  Family History:  History reviewed. No pertinent family history.    Prior to Admission medications   Medication Sig Start Date End Date Taking? Authorizing Provider  FLUoxetine (PROZAC) 20 MG capsule Take 20 mg by mouth daily.   Yes Historical Provider, MD  alprazolam [name]) 2 MG tablet Take 2 mg by mouth 2 (two) times daily as needed for anxiety.  06/10/13   Historical Provider, MD  insulin aspart protamine- aspart (NOVOLOG MIX 70/30) (70-30) 100 UNIT/ML injection Inject 15 Units into the skin 2 (two) times daily with a meal.     Historical Provider, MD  insulin glargine (LANTUS) 100 UNIT/ML injection Inject 15 Units into the skin at bedtime.    Historical Provider, MD  oxycodone (ROXICODONE) 30 MG immediate release tablet Take 30 mg by mouth every 4 (four) hours as needed for pain.     Historical Provider, MD  ramipril (ALTACE) 10 MG capsule Take 10 mg by mouth every morning.    Historical Provider, MD  Vitamin D, Ergocalciferol, (DRISDOL) 50000 UNITS CAPS capsule Take 50,000 Units by mouth every 7 (seven) days. Thursday    Historical Provider, MD    Physical Exam: Filed Vitals:   03/19/14 1804 03/19/14 2000 03/19/14 2030  BP: 106/41 103/44 96/43  Pulse:  75 71  Temp: 100.1 F (37.8 C)    TempSrc: Oral    Resp: 18 16 14   SpO2: 95% 94% 100%     General:  Well-developed and nourished.  Eyes: Anicteric no pallor. PERRLA  positive.  ENT: No discharge from the ears eyes nose mouth.  Neck: No mass felt. No neck rigidity.  Cardiovascular: S1-S2 heard.  Respiratory: No rhonchi or crepitations.  Abdomen: Soft nontender bowel sounds present. No guarding rigidity.  Skin: Multiple old skin lesions the lower extremities.  Musculoskeletal: No edema.  Psychiatric: Mildly lethargic.  Neurologic: Mildly lethargic. Follows commands. Moves all extremities. No facial asymmetry. Tongue is midline.  Labs on Admission:  Basic Metabolic Panel:  Recent Labs Lab 03/19/14 1810 03/19/14 1952  NA 126* 130*   K 6.6* 5.5*  CL 81* 91*  CO2 10* 13*  GLUCOSE 1408* 1170*  BUN 56* 54*  CREATININE 3.85* 3.53*  CALCIUM 8.4 7.9*   Liver Function Tests:  Recent Labs Lab 03/19/14 1810  AST 14  ALT 12  ALKPHOS 96  BILITOT 0.5  PROT 6.6  ALBUMIN 3.3*   No results found for this basename: LIPASE, AMYLASE,  in the last 168 hours No results found for this basename: AMMONIA,  in the last 168 hours CBC:  Recent Labs Lab 03/19/14 1810  WBC 11.8*  HGB 10.1*  HCT 35.2*  MCV 104.1*  PLT 227   Cardiac Enzymes:  Recent Labs Lab 03/19/14 1809 03/19/14 1813  CKTOTAL  --  157  TROPONINI <0.30  --     BNP (last 3 results) No results found for this basename: PROBNP,  in the last 8760 hours CBG:  Recent Labs Lab 03/19/14 1807 03/19/14 1937 03/19/14 2033  GLUCAP >600* 459* >600*    Radiological Exams on Admission: Ct Head Wo Contrast  03/19/2014   CLINICAL DATA:  Altered mental status.  Lethargy.  EXAM: CT HEAD WITHOUT CONTRAST  TECHNIQUE: Contiguous axial images were obtained from the base of the skull through the vertex without intravenous contrast.  COMPARISON:  01/21/2014  FINDINGS: There is no evidence of intracranial hemorrhage, brain edema, or other signs of acute infarction. There is no evidence of intracranial mass lesion or mass effect. No abnormal extraaxial fluid collections are identified.  Mild diffuse cerebral atrophy and chronic small vessel disease remains stable. Ventricles are stable in size. No skull abnormality identified.  IMPRESSION: No acute intracranial abnormality.  Stable mild cerebral atrophy and chronic small vessel disease.   Electronically Signed   By: Earle Gell M.D.   On: 03/19/2014 19:12   Dg Chest Port 1 View  03/19/2014   CLINICAL DATA:  Shortness of Breath  EXAM: PORTABLE CHEST - 1 VIEW  COMPARISON:  January 21, 2014  FINDINGS: The lungs are clear. Heart size and pulmonary vascularity are normal. No adenopathy. No bone lesions.  IMPRESSION: No edema or  consolidation.   Electronically Signed   By: Lowella Grip M.D.   On: 03/19/2014 20:52    EKG: Independently reviewed. Normal sinus rhythm with peak T waves.  Assessment/Plan Principal Problem:   DKA (diabetic ketoacidoses) Active Problems:   Renal failure (ARF), acute on chronic   HTN (hypertension)   Encephalopathy acute   1. Hyperosmolar uncontrolled diabetes with DKA - continue with aggressive IV fluids. Patient at this time is receiving his fourth liter normal saline bolus. Continue with IV insulin infusion and closely follow metabolic panel and intake output. Check hemoglobin A1c. Uncontrolled diabetes with hyperosmolar state and DKA probably precipitated by noncompliance but need to get proper history once patient is more alert and awake or when family is available. 2. Acute encephalopathy - presently patient is nonfocal. Probably from #1. CT head is negative. Patient does not  have any signs and symptoms of meningitis or encephalitis. Follow blood cultures. Closely observe. Check urine drug screen and salicylates and Tylenol levels. 3. Acute on chronic renal failure - creatinine has worsened from previous. Probably from #1. Due to dehydration. Check FeNa. Closely follow intake output and metabolic panel. Hold ACE inhibitors. 4. Hypertension - presently relatively hypotensive. Hold ramipril.  5. Hypokalemia and hyponatremia - probably will improve with control of patient's blood sugar.   Critical care was consulted by ED physician.   Code Status: Full code.  Family Communication: Unable to reach family.  Disposition Plan: Admit to inpatient.    [facility] Pager [phone].  If 7PM-7AM, please contact night-coverage [email or web address] Password [email or web address] 03/19/2014, 9:00 PM

## 2014-03-19 NOTE — ED Notes (Signed)
Pt placed on 2L O2 for 88% O2 sat RA, resting. Denies shob.

## 2014-03-19 NOTE — ED Notes (Signed)
Urinal placed between pts legs pt and family instructed to call when pt has been able to provide specimen

## 2014-03-19 NOTE — ED Notes (Signed)
Pt found outside by family after working all day. Lethargic, AMS. EMS was called and pt refused transport. Became more lethargic and family called ems again. Sister sts pt did not eat breakfast this am and has been acting lethargic most of the day. Very little PO intake today. Unk when pt had last dose of insulin, sister sts he has had none at least today.

## 2014-03-19 NOTE — ED Notes (Signed)
Attempted to call report at this time 

## 2014-03-19 NOTE — ED Provider Notes (Signed)
CSN: [id number]     Arrival date & time 03/19/14  1753 History   First MD Initiated Contact with Patient 03/19/14 1804     Chief Complaint  Patient presents with  . Fatigue  . Hyperglycemia     (Consider location/radiation/quality/duration/timing/severity/associated sxs/prior Treatment) Patient is a 68 y.o. male presenting with hyperglycemia. The history is provided by the patient.  Hyperglycemia  patient here with altered mental status of course the family. Does have a history of alcohol abuse in the past. He is also a diabetic and has not been taking his insulin. He denies any history of head,. Has not been following a proper diet. Very little water intake. EMS was called he initially refused transport but once he became more lethargic he was transported here. Blood sugars over 600. No reported vomiting or fever or diarrhea. No reported chest pain or shortness of breath. Symptoms persistent and no treatment used prior to arrival. Nothing makes them better or worse.  Past Medical History  Diagnosis Date  . Diabetes mellitus without complication   . Hypertension   . Hernia   . Back pain    Past Surgical History  Procedure Laterality Date  . No past surgeries    . Hip arthroplasty Right 02/05/2013    Procedure: ARTHROPLASTY BIPOLAR HIP;  Surgeon: [name], MD;  Location: [facility];  Service: Orthopedics;  Laterality: Right;   No family history on file. History  Substance Use Topics  . Smoking status: Former Smoker -- 0.25 packs/day for 30 years    Types: Cigarettes    Quit date: 01/31/2013  . Smokeless tobacco: Never Used  . Alcohol Use: Yes    Review of Systems  All other systems reviewed and are negative.     Allergies  Review of patient's allergies indicates no known allergies.  Home Medications   Prior to Admission medications   Medication Sig Start Date End Date Taking? Authorizing Provider  FLUoxetine (PROZAC) 20 MG capsule Take 20 mg by mouth daily.   Yes  Historical Provider, MD  alprazolam [name]) 2 MG tablet Take 2 mg by mouth 2 (two) times daily as needed for anxiety.  06/10/13   Historical Provider, MD  insulin aspart protamine- aspart (NOVOLOG MIX 70/30) (70-30) 100 UNIT/ML injection Inject 15 Units into the skin 2 (two) times daily with a meal.     Historical Provider, MD  insulin glargine (LANTUS) 100 UNIT/ML injection Inject 15 Units into the skin at bedtime.    Historical Provider, MD  oxycodone (ROXICODONE) 30 MG immediate release tablet Take 30 mg by mouth every 4 (four) hours as needed for pain.     Historical Provider, MD  ramipril (ALTACE) 10 MG capsule Take 10 mg by mouth every morning.    Historical Provider, MD  Vitamin D, Ergocalciferol, (DRISDOL) 50000 UNITS CAPS capsule Take 50,000 Units by mouth every 7 (seven) days. Thursday    Historical Provider, MD   BP 106/41  Temp(Src) 100.1 F (37.8 C) (Oral)  Resp 18  SpO2 95% Physical Exam  Nursing note and vitals reviewed. Constitutional: He is oriented to person, place, and time. He appears well-developed and well-nourished.  Non-toxic appearance. No distress.  HENT:  Head: Normocephalic and atraumatic.  Eyes: Conjunctivae, EOM and lids are normal. Pupils are equal, round, and reactive to light.  Neck: Normal range of motion. Neck supple. No tracheal deviation present. No mass present.  Cardiovascular: Normal rate, regular rhythm and normal heart sounds.  Exam reveals no gallop.  No murmur heard. Pulmonary/Chest: Effort normal and breath sounds normal. No stridor. No respiratory distress. He has no decreased breath sounds. He has no wheezes. He has no rhonchi. He has no rales.  Abdominal: Soft. Normal appearance and bowel sounds are normal. He exhibits no distension. There is no tenderness. There is no rebound and no CVA tenderness.  Musculoskeletal: Normal range of motion. He exhibits no edema and no tenderness.  Neurological: He is alert and oriented to person, place, and  time. He has normal strength. No cranial nerve deficit or sensory deficit. GCS eye subscore is 4. GCS verbal subscore is 5. GCS motor subscore is 6.  Skin: Skin is warm and dry. No abrasion and no rash noted.  Psychiatric: His affect is blunt. His speech is delayed. He is slowed.    ED Course  Procedures (including critical care time) Labs Review Labs Reviewed  CBG MONITORING, ED - Abnormal; Notable for the following:    Glucose-Capillary >600 (*)    All other components within normal limits  CBC  COMPREHENSIVE METABOLIC PANEL  URINALYSIS, ROUTINE W REFLEX MICROSCOPIC  BLOOD GAS, VENOUS  TROPONIN I  ETHANOL  URINE RAPID DRUG SCREEN (HOSP PERFORMED)  CBG MONITORING, ED    Imaging Review No results found.   EKG Interpretation None      MDM   Final diagnoses:  None     Date: 03/19/2014  Rate: 88  Rhythm: normal sinus rhythm  QRS Axis: normal  Intervals: normal  ST/T Wave abnormalities: nonspecific ST changes  Conduction Disutrbances:left anterior fascicular block  Narrative Interpretation:   Old EKG Reviewed: none available  Patient given 3 L of IV saline here. Blood sugar elevation noted. Started on glucometer. Potassium elevation noted and will repeat electrolytes. He has no signs of meningitis at this time. He is alert and oriented x3. He does not have any nuchal rigidity. The etiology of his fever is uncertain. Blood cultures obtained. Consult to critical care and have referred me to the hospitalist. Patient to be admitted to step down.   CRITICAL CARE Performed by: Leota Jacobsen Total critical care time: 60 Critical care time was exclusive of separately billable procedures and treating other patients. Critical care was necessary to treat or prevent imminent or life-threatening deterioration. Critical care was time spent personally by me on the following activities: development of treatment plan with patient and/or surrogate as well as nursing, discussions  with consultants, evaluation of patient's response to treatment, examination of patient, obtaining history from patient or surrogate, ordering and performing treatments and interventions, ordering and review of laboratory studies, ordering and review of radiographic studies, pulse oximetry and re-evaluation of patient's condition.       Leota Jacobsen, MD 03/19/14 [phone]

## 2014-03-20 DIAGNOSIS — N183 Chronic kidney disease, stage 3 unspecified: Secondary | ICD-10-CM

## 2014-03-20 LAB — GLUCOSE, CAPILLARY
Glucose-Capillary: 276 mg/dL — ABNORMAL HIGH (ref 70–99)
Glucose-Capillary: 421 mg/dL — ABNORMAL HIGH (ref 70–99)
Glucose-Capillary: 487 mg/dL — ABNORMAL HIGH (ref 70–99)
Glucose-Capillary: 542 mg/dL — ABNORMAL HIGH (ref 70–99)
Glucose-Capillary: 191 mg/dL — ABNORMAL HIGH (ref 70–99)
Glucose-Capillary: 102 mg/dL — ABNORMAL HIGH (ref 70–99)
Glucose-Capillary: 95 mg/dL (ref 70–99)
Glucose-Capillary: 107 mg/dL — ABNORMAL HIGH (ref 70–99)
Glucose-Capillary: 93 mg/dL (ref 70–99)
Glucose-Capillary: 110 mg/dL — ABNORMAL HIGH (ref 70–99)
Glucose-Capillary: 91 mg/dL (ref 70–99)

## 2014-03-20 LAB — BASIC METABOLIC PANEL
BUN: 41 mg/dL — ABNORMAL HIGH (ref 6–23)
BUN: 41 mg/dL — ABNORMAL HIGH (ref 6–23)
BUN: 37 mg/dL — ABNORMAL HIGH (ref 6–23)
CO2: 17 mEq/L — ABNORMAL LOW (ref 19–32)
CO2: 20 mEq/L (ref 19–32)
CO2: 20 mEq/L (ref 19–32)
Calcium: 7.6 mg/dL — ABNORMAL LOW (ref 8.4–10.5)
Calcium: 7.7 mg/dL — ABNORMAL LOW (ref 8.4–10.5)
Calcium: 7.5 mg/dL — ABNORMAL LOW (ref 8.4–10.5)
Chloride: 113 mEq/L — ABNORMAL HIGH (ref 96–112)
Chloride: 111 mEq/L (ref 96–112)
Chloride: 115 mEq/L — ABNORMAL HIGH (ref 96–112)
Creatinine, Ser: 2.6 mg/dL — ABNORMAL HIGH (ref 0.50–1.35)
Creatinine, Ser: 2.67 mg/dL — ABNORMAL HIGH (ref 0.50–1.35)
Creatinine, Ser: 2.4 mg/dL — ABNORMAL HIGH (ref 0.50–1.35)
GFR calc Af Amer: 27 mL/min — ABNORMAL LOW (ref >90)
GFR calc Af Amer: 26 mL/min — ABNORMAL LOW (ref >90)
GFR calc Af Amer: 30 mL/min — ABNORMAL LOW (ref >90)
GFR calc non Af Amer: 24 mL/min — ABNORMAL LOW (ref >90)
GFR calc non Af Amer: 23 mL/min — ABNORMAL LOW (ref >90)
GFR calc non Af Amer: 26 mL/min — ABNORMAL LOW (ref >90)
Glucose, Bld: 262 mg/dL — ABNORMAL HIGH (ref 70–99)
Glucose, Bld: 168 mg/dL — ABNORMAL HIGH (ref 70–99)
Glucose, Bld: 98 mg/dL (ref 70–99)
Potassium: 3.8 mEq/L (ref 3.7–5.3)
Potassium: 3.8 mEq/L (ref 3.7–5.3)
Potassium: 4.3 mEq/L (ref 3.7–5.3)
Sodium: 147 mEq/L (ref 137–147)
Sodium: 145 mEq/L (ref 137–147)
Sodium: 148 mEq/L — ABNORMAL HIGH (ref 137–147)

## 2014-03-20 LAB — HEMOGLOBIN A1C
Hgb A1c MFr Bld: 11.8 % — ABNORMAL HIGH (ref <5.7)
Mean Plasma Glucose: 292 mg/dL — ABNORMAL HIGH (ref <117)

## 2014-03-20 LAB — CBC WITH DIFFERENTIAL/PLATELET
Basophils Absolute: 0 10*3/uL (ref 0.0–0.1)
Basophils Relative: 0 % (ref 0–1)
Eosinophils Absolute: 0.1 10*3/uL (ref 0.0–0.7)
Eosinophils Relative: 0 % (ref 0–5)
HCT: 30.3 % — ABNORMAL LOW (ref 39.0–52.0)
Hemoglobin: 10.2 g/dL — ABNORMAL LOW (ref 13.0–17.0)
Lymphocytes Relative: 16 % (ref 12–46)
Lymphs Abs: 1.8 10*3/uL (ref 0.7–4.0)
MCH: 29.9 pg (ref 26.0–34.0)
MCHC: 33.7 g/dL (ref 30.0–36.0)
MCV: 88.9 fL (ref 78.0–100.0)
Monocytes Absolute: 1.2 10*3/uL — ABNORMAL HIGH (ref 0.1–1.0)
Monocytes Relative: 11 % (ref 3–12)
Neutro Abs: 8.1 10*3/uL — ABNORMAL HIGH (ref 1.7–7.7)
Neutrophils Relative %: 73 % (ref 43–77)
Platelets: 194 10*3/uL (ref 150–400)
RBC: 3.41 MIL/uL — ABNORMAL LOW (ref 4.22–5.81)
RDW: 13.2 % (ref 11.5–15.5)
WBC: 11.2 10*3/uL — ABNORMAL HIGH (ref 4.0–10.5)

## 2014-03-20 LAB — TSH: TSH: 1.62 u[IU]/mL (ref 0.350–4.500)

## 2014-03-20 LAB — MRSA PCR SCREENING: MRSA by PCR: NEGATIVE

## 2014-03-20 MED ORDER — INSULIN ASPART 100 UNIT/ML ~~LOC~~ SOLN
0.0000 [IU] | Freq: Three times a day (TID) | SUBCUTANEOUS | Status: DC
  Administered 2014-03-21: 2 [IU] via SUBCUTANEOUS
  Administered 2014-03-21 – 2014-03-22 (×2): 8 [IU] via SUBCUTANEOUS

## 2014-03-20 MED ORDER — INSULIN GLARGINE 100 UNIT/ML ~~LOC~~ SOLN
10.0000 [IU] | Freq: Once | SUBCUTANEOUS | Status: AC
  Administered 2014-03-20: 10 [IU] via SUBCUTANEOUS
  Filled 2014-03-20: qty 0.1

## 2014-03-20 MED ORDER — POTASSIUM CHLORIDE 10 MEQ/100ML IV SOLN
10.0000 meq | INTRAVENOUS | Status: AC
  Administered 2014-03-20 (×2): 10 meq via INTRAVENOUS

## 2014-03-20 MED ORDER — INSULIN ASPART PROT & ASPART (70-30 MIX) 100 UNIT/ML ~~LOC~~ SUSP
15.0000 [IU] | Freq: Two times a day (BID) | SUBCUTANEOUS | Status: DC
  Administered 2014-03-20 – 2014-03-22 (×4): 15 [IU] via SUBCUTANEOUS
  Filled 2014-03-20 (×2): qty 10

## 2014-03-20 MED ORDER — INSULIN ASPART 100 UNIT/ML ~~LOC~~ SOLN
4.0000 [IU] | Freq: Three times a day (TID) | SUBCUTANEOUS | Status: DC
  Administered 2014-03-20: 4 [IU] via SUBCUTANEOUS

## 2014-03-20 NOTE — Progress Notes (Signed)
[facility] PROGRESS NOTE  [name] [name] [id number] DOB: [birth year] DOA: 03/19/2014 PCP: No primary provider on file.  Assessment/Plan: DKA -Resolved. -Transitioned to 70/30 today. -Nutrition consult for diet education.  Acute Encephalopathy -Resolved. -From DKA most likely.  Acute on CKD Stage III -Baseline Cr is 1.48. -Is 2.4 today down from 3.17 on admission. -Likely a component of prerenal azotemia from dehydration related to DKA.  Code Status: Full Code Family Communication: Discussed with daughter at bedside.  Disposition Plan: Home when ready; likely 24 hours.   Consultants:  None   Antibiotics:  None   Subjective: No complaints.  Objective: Filed Vitals:   03/20/14 0900 03/20/14 1100 03/20/14 1200 03/20/14 1246  BP: 108/52 128/82 149/62 151/66  Pulse: 52 57 58 63  Temp:   98.1 F (36.7 C) 98.2 F (36.8 C)  TempSrc:   Oral Oral  Resp: 12 13 21 18   Height:      Weight:      SpO2: 100% 98% 100% 100%    Intake/Output Summary (Last 24 hours) at 03/20/14 1251 Last data filed at 03/20/14 0800  Gross per 24 hour  Intake 7260.58 ml  Output   1102 ml  Net 6158.58 ml   Filed Weights   03/19/14 2154  Weight: 65.2 kg (143 lb 11.8 oz)    Exam:   General:  AA Ox3  Cardiovascular: RRR  Respiratory: CTA B  Abdomen: S/NT/ND/+BS  Extremities: no C/C/E   Neurologic:  Non-focal  Data Reviewed: Basic Metabolic Panel:  Recent Labs Lab 03/19/14 2023 03/19/14 2247 03/20/14 0249 03/20/14 0447 03/20/14 0820  NA 133* 137 147 145 148*  K 4.7 4.0 3.8 3.8 4.3  CL 97 102 113* 111 115*  CO2 14* 17* 17* 20 20  GLUCOSE 944* 732* 262* 168* 98  BUN 50* 47* 41* 41* 37*  CREATININE 3.38* 3.17* 2.60* 2.67* 2.40*  CALCIUM 7.7* 7.7* 7.6* 7.7* 7.5*   Liver Function Tests:  Recent Labs Lab 03/19/14 1810  AST 14  ALT 12  ALKPHOS 96  BILITOT 0.5  PROT 6.6  ALBUMIN 3.3*   No results found for this basename: LIPASE, AMYLASE,  in the  last 168 hours No results found for this basename: AMMONIA,  in the last 168 hours CBC:  Recent Labs Lab 03/19/14 1810 03/20/14 0447  WBC 11.8* 11.2*  NEUTROABS  --  8.1*  HGB 10.1* 10.2*  HCT 35.2* 30.3*  MCV 104.1* 88.9  PLT 227 194   Cardiac Enzymes:  Recent Labs Lab 03/19/14 1809 03/19/14 1813  CKTOTAL  --  157  TROPONINI <0.30  --    BNP (last 3 results) No results found for this basename: PROBNP,  in the last 8760 hours CBG:  Recent Labs Lab 03/20/14 0533 03/20/14 0648 03/20/14 0742 03/20/14 0856 03/20/14 1204  GLUCAP 102* 95 107* 93 110*    Recent Results (from the past 240 hour(s))  MRSA PCR SCREENING     Status: None   Collection Time    03/19/14  9:59 PM      Result Value Ref Range Status   MRSA by PCR NEGATIVE  NEGATIVE Final   Comment:            The GeneXpert MRSA Assay (FDA     approved for NASAL specimens     only), is one component of a     comprehensive MRSA colonization     surveillance program. It is not     intended to diagnose  MRSA     infection nor to guide or     monitor treatment for     MRSA infections.     Studies: Ct Head Wo Contrast  03/19/2014   CLINICAL DATA:  Altered mental status.  Lethargy.  EXAM: CT HEAD WITHOUT CONTRAST  TECHNIQUE: Contiguous axial images were obtained from the base of the skull through the vertex without intravenous contrast.  COMPARISON:  01/21/2014  FINDINGS: There is no evidence of intracranial hemorrhage, brain edema, or other signs of acute infarction. There is no evidence of intracranial mass lesion or mass effect. No abnormal extraaxial fluid collections are identified.  Mild diffuse cerebral atrophy and chronic small vessel disease remains stable. Ventricles are stable in size. No skull abnormality identified.  IMPRESSION: No acute intracranial abnormality.  Stable mild cerebral atrophy and chronic small vessel disease.   Electronically Signed   By: Earle Gell M.D.   On: 03/19/2014 19:12   Dg  Chest Port 1 View  03/19/2014   CLINICAL DATA:  Shortness of Breath  EXAM: PORTABLE CHEST - 1 VIEW  COMPARISON:  January 21, 2014  FINDINGS: The lungs are clear. Heart size and pulmonary vascularity are normal. No adenopathy. No bone lesions.  IMPRESSION: No edema or consolidation.   Electronically Signed   By: Lowella Grip M.D.   On: 03/19/2014 20:52    Scheduled Meds: . enoxaparin (LOVENOX) injection  30 mg Subcutaneous QHS  . insulin aspart  0-15 Units Subcutaneous TID WC  . insulin aspart  4 Units Subcutaneous TID WC  . insulin aspart protamine- aspart  15 Units Subcutaneous BID WC   Continuous Infusions: . sodium chloride      Principal Problem:   DKA (diabetic ketoacidoses) Active Problems:   Renal failure (ARF), acute on chronic   HTN (hypertension)   Encephalopathy acute   CKD (chronic kidney disease) stage 3, GFR 30-59 ml/min    Time spent: 35 minutes. Greater than 50% of this time was spent in direct contact with the patient coordinating care.    [facility] Pager [phone]  If 7PM-7AM, please contact night-coverage at [email or web address], password [facility] 03/20/2014, 12:51 PM  LOS: 1 day

## 2014-03-20 NOTE — Progress Notes (Signed)
Inpatient Diabetes Program Recommendations  AACE/ADA: New Consensus Statement on Inpatient Glycemic Control (2013)  Target Ranges:  Prepandial:   less than 140 mg/dL      Peak postprandial:   less than 180 mg/dL (1-2 hours)      Critically ill patients:  140 - 180 mg/dL   Reason for Visit:   Diabetes history: DM2 Outpatient Diabetes medications: 70/30 15 units bid Current orders for Inpatient glycemic control: Lantus 10 units (once) and 70/30 15 units bid to begin at 1700, Novolog moderate tidwc +4 units tidwc  68 y.o. male with history of diabetes mellitus, hypertension chronic pain was brought to the ER after patient's family found that patient was confused. Blood sugar was found to be around 1400 with anion gap. Patient's family states that he has not been taking his insulin at least for one day. GlucoStabilizer started with DKA orders. Now transitioning to SQ after criteria met for transition to SQ.  Familiar with pt from previous admissions. Pt states he ran out of his 70/30 insulin 2 days ago. Does not take Lantus anymore d/t lack of affordability. States he checks blood sugars daily, "they usually run high." States his diet has been sub-optimal lately. When asked about hypoglyemia, he never was able to answer my question. Kept referring back to high blood sugars. Lives with brother. Has hx hypoglycemia when hospitalized and seems to be very sensitive to insulin. Last HgbA1C - 8.1 on 01/21/14. Will need updated HgbA1C.  Results for [name] [name], [name] [name] (MRN [id number]) as of 03/20/2014 11:54  Ref. Range 03/20/2014 04:47 03/20/2014 08:20  Sodium Latest Range: 137-147 mEq/L 145 148 (H)  Potassium Latest Range: 3.7-5.3 mEq/L 3.8 4.3  Chloride Latest Range: 96-112 mEq/L 111 115 (H)  CO2 Latest Range: 19-32 mEq/L 20 20  BUN Latest Range: 6-23 mg/dL 41 (H) 37 (H)  Creatinine Latest Range: 0.50-1.35 mg/dL 2.67 (H) 2.40 (H)  Calcium Latest Range: 8.4-10.5 mg/dL 7.7 (L) 7.5 (L)  GFR calc non Af Amer  Latest Range: >90 mL/min 23 (L) 26 (L)  GFR calc Af Amer Latest Range: >90 mL/min 26 (L) 30 (L)  Glucose Latest Range: 70-99 mg/dL 168 (H) 98   Also noted C-peptide 0.00 ng/ml on 01/21/14 which is indicative that the beta cells are not making any insulin.   Recommendations: Discontinue Novolog meal coverage insulin since pt will be on 70/30 insulin at 1700.  Decrease Novolog to sensitive tidwc. Updated HgbA1C to assess recent glycemic control. Chart review indicates pt did not go to appt that was scheduled at [facility] after last discharge. Case manager/Social Work consult for medication assistance and possible home Doctor, general practice. Would benefit from OP Diabetes Education at [facility] for support with making dietary changes to improve glycemic control.   Will continue to follow while inpatient. Thank you. Lorenda Peck, RD, LDN, CDE Inpatient Diabetes Coordinator [phone]

## 2014-03-20 NOTE — Progress Notes (Signed)
Nutrition Education Note  RD consulted for nutrition education regarding diabetes.   Lab Results  Component Value Date   HGBA1C 8.1* 01/21/2014    RD provided "Carbohydrate Counting for People with Diabetes" handout from the Academy of Nutrition and Dietetics. Discussed different food groups and their effects on blood sugar, emphasizing carbohydrate-containing foods. Provided list of carbohydrates and recommended serving sizes of common foods.  Discussed importance of controlled and consistent carbohydrate intake throughout the day. Provided examples of ways to balance meals/snacks and encouraged intake of high-fiber, whole grain complex carbohydrates. Teach back method used.  Pt reports usual intake is 2 meals/day, a ham sandwich without anything to drink at 11am and corn, mashed potatoes, and turnips between 8-8:30pm from [employer]. Drinks 2 cups of sweet tea with dinner meal. Reports he has been trying to drink more water and knows he was dehydrated when he was admitted. Healthy beverage choices discussed, encouraged unsweet tea or water, and portions of CHO reviewed. Pt appears motivated to make dietary changes, encouragement provided.   Expect good compliance.  Body mass index is 23.92 kg/(m^2). Pt meets criteria for normal weight based on current BMI.  Current diet order is CHO modified, patient reports eating well. Labs and medications reviewed. No further nutrition interventions warranted at this time. RD contact information provided. If additional nutrition issues arise, please re-consult RD.  Carlis Stable MS, [place], LDN [phone] Pager [phone] Weekend/After Hours Pager

## 2014-03-20 NOTE — Progress Notes (Signed)
CARE MANAGEMENT NOTE 03/20/2014  Patient:  [name] [name],[name] [name]   Account Number:  [id number]  Date Initiated:  03/20/2014  Documentation initiated by:  DAVIS,RHONDA  Subjective/Objective Assessment:   pt found confused in his  yard, glucose greater than 1400, iv insulin [id number]     Action/Plan:   transferred from sdu to 1309 [id number]   Anticipated DC Date:  03/21/2014   Anticipated DC Plan:  HOME/SELF CARE  In-house referral  NA      DC Planning Services  NA      [facility] Choice  NA   Choice offered to / List presented to:  NA   DME arranged  NA  NA  NA      DME agency  NA     [place] arranged  NA      [place] agency  NA   Status of service:  In process, will continue to follow Medicare Important Message given?  NA - LOS <3 / Initial given by admissions (If response is "NO", the following Medicare IM given date fields will be blank) Date Medicare IM given:   Date Additional Medicare IM given:    Discharge Disposition:    Per UR Regulation:  Reviewed for med. necessity/level of care/duration of stay  If discussed at [place] of Stay Meetings, dates discussed:    Comments:  [id number]/Rhonda Rosana Hoes RN, BSN, [place], [phone] Chart reviewed for update of needs and condition./

## 2014-03-21 DIAGNOSIS — R7309 Other abnormal glucose: Secondary | ICD-10-CM

## 2014-03-21 LAB — GLUCOSE, CAPILLARY
Glucose-Capillary: 47 mg/dL — ABNORMAL LOW (ref 70–99)
Glucose-Capillary: 131 mg/dL — ABNORMAL HIGH (ref 70–99)
Glucose-Capillary: 106 mg/dL — ABNORMAL HIGH (ref 70–99)

## 2014-03-21 LAB — CBC
HCT: 29.8 % — ABNORMAL LOW (ref 39.0–52.0)
Hemoglobin: 10.1 g/dL — ABNORMAL LOW (ref 13.0–17.0)
MCH: 29.6 pg (ref 26.0–34.0)
MCHC: 33.9 g/dL (ref 30.0–36.0)
MCV: 87.4 fL (ref 78.0–100.0)
Platelets: 206 10*3/uL (ref 150–400)
RBC: 3.41 MIL/uL — ABNORMAL LOW (ref 4.22–5.81)
RDW: 13.7 % (ref 11.5–15.5)
WBC: 9.7 10*3/uL (ref 4.0–10.5)

## 2014-03-21 LAB — BASIC METABOLIC PANEL
BUN: 23 mg/dL (ref 6–23)
CO2: 20 mEq/L (ref 19–32)
Calcium: 7.9 mg/dL — ABNORMAL LOW (ref 8.4–10.5)
Chloride: 109 mEq/L (ref 96–112)
Creatinine, Ser: 1.62 mg/dL — ABNORMAL HIGH (ref 0.50–1.35)
GFR calc Af Amer: 48 mL/min — ABNORMAL LOW (ref >90)
GFR calc non Af Amer: 42 mL/min — ABNORMAL LOW (ref >90)
Glucose, Bld: 51 mg/dL — ABNORMAL LOW (ref 70–99)
Potassium: 4.1 mEq/L (ref 3.7–5.3)
Sodium: 142 mEq/L (ref 137–147)

## 2014-03-21 MED ORDER — OXYCODONE HCL 5 MG PO TABS
20.0000 mg | ORAL_TABLET | Freq: Four times a day (QID) | ORAL | Status: DC | PRN
  Administered 2014-03-21 (×2): 10 mg via ORAL
  Administered 2014-03-22: 20 mg via ORAL
  Filled 2014-03-21 (×3): qty 4

## 2014-03-21 NOTE — Progress Notes (Signed)
CBG 47 this am. Glucose was 51 with labs. Patient awake. Given juice by NT and ordering breakfast.

## 2014-03-21 NOTE — Progress Notes (Signed)
[facility] PROGRESS NOTE  Zaydn Mccants [id number] DOB: 1946/03/08 DOA: 03/19/2014 PCP: No primary provider on file.  68 y/o ?, known H/o Ty 2 DM admitted [id number]  With Metabolic acidosis + hyperosmolar state.  He was transitioned relatively rapidly off of Insulin gtt and transferred tot he floor  Assessment/Plan: DKA -Resolved. -Transitioned to 70/30 15 U bid on  03/20/14--this dose was cut back to 12 U as patient experienced hypoglycemic event to 39 -Nutrition consult for diet education.  Acute Encephalopathy -Resolved. -From DKA most likely.  Acute on CKD Stage III -Baseline Cr is 1.48. -Is 1.62  today down from 3.17 on admission. -Likely a component of prerenal azotemia from dehydration related to DKA.  Code Status: Full Code Family Communication: none present currently Disposition Plan: Home when ready; likely 24 hours.   Consultants:  None  Antibiotics:  None   Subjective:  Doing fair.  denies N/V/CP Tolerating diet fairly well NO other co  Objective: Filed Vitals:   03/20/14 2108 03/20/14 2214 03/21/14 0151 03/21/14 0520  BP: 116/72  128/80 158/77  Pulse: 56 58 62 63  Temp: 99 F (37.2 C)  99.5 F (37.5 C) 97.8 F (36.6 C)  TempSrc: Oral  Oral Oral  Resp: 18  18 16   Height:      Weight:      SpO2: 100%  100% 100%    Intake/Output Summary (Last 24 hours) at 03/21/14 0951 Last data filed at 03/21/14 0525  Gross per 24 hour  Intake 1804.75 ml  Output    676 ml  Net 1128.75 ml   Filed Weights   03/19/14 2154  Weight: 65.2 kg (143 lb 11.8 oz)    Exam:   General:  AA Ox3  Cardiovascular: RRR  Respiratory: CTA B  Abdomen: S/NT/ND/+BS  Extremities: no C/C/E   Neurologic:  Non-focal  Data Reviewed: Basic Metabolic Panel:  Recent Labs Lab 03/19/14 2247 03/20/14 0249 03/20/14 0447 03/20/14 0820 03/21/14 0427  NA 137 147 145 148* 142  K 4.0 3.8 3.8 4.3 4.1  CL 102 113* 111 115* 109  CO2 17* 17* 20 20 20   GLUCOSE  732* 262* 168* 98 51*  BUN 47* 41* 41* 37* 23  CREATININE 3.17* 2.60* 2.67* 2.40* 1.62*  CALCIUM 7.7* 7.6* 7.7* 7.5* 7.9*   Liver Function Tests:  Recent Labs Lab 03/19/14 1810  AST 14  ALT 12  ALKPHOS 96  BILITOT 0.5  PROT 6.6  ALBUMIN 3.3*   No results found for this basename: LIPASE, AMYLASE,  in the last 168 hours No results found for this basename: AMMONIA,  in the last 168 hours CBC:  Recent Labs Lab 03/19/14 1810 03/20/14 0447 03/21/14 0427  WBC 11.8* 11.2* 9.7  NEUTROABS  --  8.1*  --   HGB 10.1* 10.2* 10.1*  HCT 35.2* 30.3* 29.8*  MCV 104.1* 88.9 87.4  PLT 227 194 206   Cardiac Enzymes:  Recent Labs Lab 03/19/14 1809 03/19/14 1813  CKTOTAL  --  157  TROPONINI <0.30  --    BNP (last 3 results) No results found for this basename: PROBNP,  in the last 8760 hours CBG:  Recent Labs Lab 03/20/14 0856 03/20/14 1204 03/20/14 2241 03/21/14 0703 03/21/14 0743  GLUCAP 93 110* 91 47* 131*    Recent Results (from the past 240 hour(s))  MRSA PCR SCREENING     Status: None   Collection Time    03/19/14  9:59 PM  Result Value Ref Range Status   MRSA by PCR NEGATIVE  NEGATIVE Final   Comment:            The GeneXpert MRSA Assay (FDA     approved for NASAL specimens     only), is one component of a     comprehensive MRSA colonization     surveillance program. It is not     intended to diagnose MRSA     infection nor to guide or     monitor treatment for     MRSA infections.     Studies: Ct Head Wo Contrast  03/19/2014   CLINICAL DATA:  Altered mental status.  Lethargy.  EXAM: CT HEAD WITHOUT CONTRAST  TECHNIQUE: Contiguous axial images were obtained from the base of the skull through the vertex without intravenous contrast.  COMPARISON:  01/21/2014  FINDINGS: There is no evidence of intracranial hemorrhage, brain edema, or other signs of acute infarction. There is no evidence of intracranial mass lesion or mass effect. No abnormal extraaxial fluid  collections are identified.  Mild diffuse cerebral atrophy and chronic small vessel disease remains stable. Ventricles are stable in size. No skull abnormality identified.  IMPRESSION: No acute intracranial abnormality.  Stable mild cerebral atrophy and chronic small vessel disease.   Electronically Signed   By: Earle Gell M.D.   On: 03/19/2014 19:12   Dg Chest Port 1 View  03/19/2014   CLINICAL DATA:  Shortness of Breath  EXAM: PORTABLE CHEST - 1 VIEW  COMPARISON:  January 21, 2014  FINDINGS: The lungs are clear. Heart size and pulmonary vascularity are normal. No adenopathy. No bone lesions.  IMPRESSION: No edema or consolidation.   Electronically Signed   By: Lowella Grip M.D.   On: 03/19/2014 20:52    Scheduled Meds: . enoxaparin (LOVENOX) injection  30 mg Subcutaneous QHS  . insulin aspart  0-15 Units Subcutaneous TID WC  . insulin aspart protamine- aspart  15 Units Subcutaneous BID WC   Continuous Infusions: . sodium chloride 1,000 mL (03/21/14 0427)    Principal Problem:   DKA (diabetic ketoacidoses) Active Problems:   Renal failure (ARF), acute on chronic   HTN (hypertension)   Encephalopathy acute   CKD (chronic kidney disease) stage 3, GFR 30-59 ml/min  Verneita Griffes, MD [facility] (P) [phone]

## 2014-03-21 NOTE — Care Management Note (Signed)
    Page 1 of 2   03/21/2014     10:51:25 AM CARE MANAGEMENT NOTE 03/21/2014  Patient:  [name] [name]   Account Number:  [id number]  Date Initiated:  03/20/2014  Documentation initiated by:  DAVIS,RHONDA  Subjective/Objective Assessment:   pt found confused in his  yard, glucose greater than 1400, iv insulin [id number]     Action/Plan:   transferred from sdu to 1309 [id number]   Anticipated DC Date:  03/22/2014   Anticipated DC Plan:  HOME/SELF CARE  In-house referral  NA      DC Planning Services  CM consult      PAC Choice  NA   Choice offered to / List presented to:  NA   DME arranged  NA  NA  NA      DME agency  NA     [place] arranged  NA      [place] agency  NA   Status of service:  In process, will continue to follow Medicare Important Message given?  NA - LOS <3 / Initial given by admissions (If response is "NO", the following Medicare IM given date fields will be blank) Date Medicare IM given:   Date Additional Medicare IM given:    Discharge Disposition:    Per UR Regulation:  Reviewed for med. necessity/level of care/duration of stay  If discussed at [place] of Stay Meetings, dates discussed:    Comments:  03/21/14 [name] Peery RN,BSN NCM [place].NO ANTICIPATED D/C NEEDS.  [id number] Rosana Hoes, RN, BSN, [place], [phone] Chart reviewed for update of needs and condition./

## 2014-03-21 NOTE — Progress Notes (Signed)
Attempted to visit pt; pt was lying in bed asleep; did not disturb pt. Please call if Chaplain support is needed. [place]

## 2014-03-22 ENCOUNTER — Other Ambulatory Visit: Payer: Self-pay

## 2014-03-22 ENCOUNTER — Emergency Department (HOSPITAL_COMMUNITY)
Admission: EM | Admit: 2014-03-22 | Discharge: 2014-03-23 | Disposition: A | Discharge status: Home / Self Care | Payer: PRIVATE HEALTH INSURANCE | Source: Home / Self Care | Attending: Emergency Medicine | Admitting: Emergency Medicine

## 2014-03-22 ENCOUNTER — Encounter (HOSPITAL_COMMUNITY): Payer: Self-pay | Admitting: Emergency Medicine

## 2014-03-22 DIAGNOSIS — I10 Essential (primary) hypertension: Secondary | ICD-10-CM

## 2014-03-22 DIAGNOSIS — Z79899 Other long term (current) drug therapy: Secondary | ICD-10-CM

## 2014-03-22 DIAGNOSIS — R4182 Altered mental status, unspecified: Secondary | ICD-10-CM

## 2014-03-22 DIAGNOSIS — R569 Unspecified convulsions: Secondary | ICD-10-CM

## 2014-03-22 DIAGNOSIS — E119 Type 2 diabetes mellitus without complications: Secondary | ICD-10-CM | POA: Insufficient documentation

## 2014-03-22 DIAGNOSIS — Z87891 Personal history of nicotine dependence: Secondary | ICD-10-CM

## 2014-03-22 DIAGNOSIS — Z794 Long term (current) use of insulin: Secondary | ICD-10-CM | POA: Insufficient documentation

## 2014-03-22 DIAGNOSIS — R739 Hyperglycemia, unspecified: Secondary | ICD-10-CM

## 2014-03-22 LAB — GLUCOSE, CAPILLARY
Glucose-Capillary: 269 mg/dL — ABNORMAL HIGH (ref 70–99)
Glucose-Capillary: 72 mg/dL (ref 70–99)
Glucose-Capillary: 45 mg/dL — ABNORMAL LOW (ref 70–99)
Glucose-Capillary: 54 mg/dL — ABNORMAL LOW (ref 70–99)
Glucose-Capillary: 131 mg/dL — ABNORMAL HIGH (ref 70–99)
Glucose-Capillary: 285 mg/dL — ABNORMAL HIGH (ref 70–99)

## 2014-03-22 LAB — CBC
HCT: 33.1 % — ABNORMAL LOW (ref 39.0–52.0)
Hemoglobin: 11.1 g/dL — ABNORMAL LOW (ref 13.0–17.0)
MCH: 29.6 pg (ref 26.0–34.0)
MCHC: 33.5 g/dL (ref 30.0–36.0)
MCV: 88.3 fL (ref 78.0–100.0)
Platelets: 185 10*3/uL (ref 150–400)
RBC: 3.75 MIL/uL — ABNORMAL LOW (ref 4.22–5.81)
RDW: 13.5 % (ref 11.5–15.5)
WBC: 6.5 10*3/uL (ref 4.0–10.5)

## 2014-03-22 LAB — CBG MONITORING, ED: Glucose-Capillary: 398 mg/dL — ABNORMAL HIGH (ref 70–99)

## 2014-03-22 MED ORDER — GLUCOSE-VITAMIN C 4-6 GM-MG PO CHEW
4.0000 | CHEWABLE_TABLET | ORAL | Status: DC | PRN
  Administered 2014-03-22: 2 g via ORAL

## 2014-03-22 MED ORDER — INSULIN ASPART PROT & ASPART (70-30 MIX) 100 UNIT/ML ~~LOC~~ SUSP: 10.0000 [IU] | Freq: Two times a day (BID) | SUBCUTANEOUS | Status: DC

## 2014-03-22 MED ORDER — GLUCOSE 40 % PO GEL
ORAL | Status: AC
  Filled 2014-03-22: qty 1

## 2014-03-22 MED ORDER — INSULIN GLARGINE 100 UNIT/ML ~~LOC~~ SOLN: 15.0000 [IU] | Freq: Every day | SUBCUTANEOUS | Status: DC

## 2014-03-22 MED ORDER — GLUCOSE-VITAMIN C 4-6 GM-MG PO CHEW
CHEWABLE_TABLET | ORAL | Status: AC
  Administered 2014-03-22: 2 g via ORAL
  Filled 2014-03-22: qty 1

## 2014-03-22 NOTE — ED Notes (Signed)
Per EMS, it is suspected that the pt had an unwitnessed seizure tonight. Pt was at home with his friend tonight, his friend left to go to the gas station for a few minutes and came back and the pt had an altered mental status, was only answering yes or no to questions. EMS called, EMS reports the believed the pt to be post-ictal, pt's mental status improved throughout the ambulance ride. Pt has a hx of seizures. Pt incontinent. Pt's CBG 420. Pt A&O X3 upon initial assessment to department.

## 2014-03-22 NOTE — Progress Notes (Signed)
Discharge summary sent to payer through [facility]  

## 2014-03-22 NOTE — Progress Notes (Signed)
CBG 54 at hs given juice and crackers with CBG increasing to 72 by 2244

## 2014-03-22 NOTE — Discharge Instructions (Signed)
Diabetic Ketoacidosis °Diabetic ketoacidosis (DKA) is a life-threatening complication of type 1 diabetes. It must be quickly recognized and treated. Treatment requires hospitalization. °CAUSES  °When there is no insulin in the body, glucose (sugar) cannot be used and the body breaks down fat for energy. When fat breaks down, acids (ketones) build up in the blood. Very high levels of glucose and high levels of acids lead to severe loss of body fluids (dehydration) and other dangerous chemical changes. This stresses your vital organs and can cause coma or death. °SYMPTOMS  °· Tiredness (fatigue). °· Weight loss. °· Excessive thirst. °· Ketones in the urine. °· Lightheadedness. °· Fruity or sweet smell on your breath. °· Excessive urination. °· Visual changes. °· Confusion or irritability. °· Feeling sick to your stomach (nauseous) or vomiting. °· Rapid breathing. °· Stomachache or belly (abdominal) pain. °DIAGNOSIS  °Your caregiver will diagnose DKA based on your history, physical exam, and blood tests. Your caregiver will check if there is another illness present which caused you to go into DKA. Most of this will be done quickly in an emergency room. °TREATMENT  °· Fluid replacement to correct dehydration. °· Insulin. °· Correction of electrolytes, such as potassium and sodium. °· Medicines (antibiotics) that kill germs for infections. °PREVENTION °· Always take your insulin. Do not skip your insulin injections. °· If you are ill, treat yourself quickly. Your body often needs more insulin to fight the illness. °· Check your blood glucose regularly. °· Check urine ketones if your blood glucose is greater than 240 milligrams per deciliter (mg/dl). °· Do not used expired or outdated insulin. °· If your blood glucose is high, drink plenty of fluids. This helps flush out ketones. °HOME CARE INSTRUCTIONS  °· If you are ill, follow the advice of your caregiver. °· To prevent loss of body fluids (dehydration), drink enough  water and fluids to keep your urine clear or pale yellow. °· If you cannot eat, alternate between drinking fluids with sugar (soda, juices, flavored gelatin) and salty fluids (broth, bouillon). °· If you can eat, follow your usual diet and drink sugar-free liquids (water, diet drinks). °· Always take your usual dose of insulin. If you cannot eat, or your glucose is getting too low, call your caregiver for further instructions. °· Continue to monitor your blood or urine ketones every 3 to 4 hours around the clock. Set your alarm clock or have someone wake you up. If you are too sick, have someone test it for you. °· Rest and avoid exercise. °SEEK MEDICAL CARE IF:  °· You have ketones in your urine or your blood glucose is higher than a level your caregiver suggests. You may need extra insulin. Call your caregiver if you need advice on adjusting your insulin. °· You cannot drink at least a tablespoon of fluid every 15 to 20 minutes. °· You have been throwing up for more than 2 hours. °· You have symptoms of DKA: °· Fruity smelling breath. °· Breathing faster or slower. °· Becoming very sleepy. °SEEK IMMEDIATE MEDICAL CARE IF:  °· You have signs of dehydration: °· Decreased urination. °· Increased thirst. °· Dry skin and mouth. °· Lightheadedness. °· Your blood glucose is very high (as advised by your caregiver) twice in a row. °· You or your child has an oral temperature above 102° F (38.9° C), not controlled by medicine. °· You pass out. °· You have chest pain and/or trouble breathing. °· You have a sudden, severe headache. °· You have sudden   weakness in one arm and/or one leg. °· You have sudden difficulty speaking and/or swallowing. °· You develop vomiting and/or diarrhea that is getting worse after 3 to 4 hours. °· You have abdominal pain. °MAKE SURE YOU:  °· Understand these instructions. °· Will watch your condition. °· Will get help right away if you are not doing well or get worse. °Document Released:  09/26/2000 Document Revised: 12/22/2011 Document Reviewed: 04/04/2009 °Exit[employer]® Patient Information ©2014 [employer]. ° °

## 2014-03-22 NOTE — Plan of Care (Signed)
Problem: Phase III Progression Outcomes Goal: Activity at appropriate level-compared to baseline (UP IN CHAIR FOR HEMODIALYSIS)  Outcome: Completed/Met Date Met:  03/22/14 oob ambulating in room with observation.

## 2014-03-22 NOTE — ED Notes (Signed)
CBG taken. CBG = 398. Nurse notified.

## 2014-03-22 NOTE — Discharge Summary (Signed)
Physician Discharge Summary  [name] [name] [id number] DOB: June 28, 1946 DOA: 03/19/2014  PCP: Guadlupe Spanish, MD  Admit date: 03/19/2014 Discharge date: 03/22/2014   Recommendations for Outpatient Follow-Up:   1. Recommend close followup of outpatient glycemic control.   Discharge Diagnosis:   Principal Problem:    DKA (diabetic ketoacidoses) Active Problems:    Renal failure (ARF), acute on chronic    HTN (hypertension)    Encephalopathy acute    CKD (chronic kidney disease) stage 3, GFR 30-59 ml/min   Discharge Condition: Improved.  Diet recommendation: Carbohydrate-modified.    History of Present Illness:   68 year old male who was admitted 03/19/14 after being found unresponsive in his yard, found to have DKA.   Hospital Course by Problem:   Principal Problem: DKA (diabetic ketoacidoses)  Resolved with insulin drip and aggressive IV fluid hydration.  Transitioned to basal/bolus insulin on 03/20/14.  Insulin therapy adjusted secondary to hypoglycemia.  Recommend close followup with PCP to ensure adequate glycemic control.  Active Problems: Renal failure (ARF), acute on chronic / stage III chronic kidney disease  Baseline creatinine 1.48.  Acute component secondary to severe dehydration related to DKA.  Discharge creatinine close to usual baseline values.  HTN (hypertension)  Ramipril held on admission secondary to acute renal failure.  Okay to resume at discharge.  Encephalopathy acute  Secondary to metabolic arrangements, resolved.  Procedures:    None.   Medical Consultants:    None.   Discharge Exam:   Filed Vitals:   03/22/14 0659  BP: 178/69  Pulse: 55  Temp:   Resp:    Filed Vitals:   03/22/14 0202 03/22/14 0608 03/22/14 0611 03/22/14 0659  BP: 160/74 176/65  178/69  Pulse: 54 46 50 55  Temp: 98.1 F (36.7 C) 98.8 F (37.1 C)    TempSrc: Oral Oral    Resp: 18 16    Height:      Weight:      SpO2: 99% 98%       Gen:  NAD Cardiovascular:  RRR, No M/R/G Respiratory: Lungs CTAB Gastrointestinal: Abdomen soft, NT/ND with normal active bowel sounds. Extremities: No C/E/C  Discharge Instructions:       Discharge Instructions   Call MD for:    Complete by:  As directed   Excessive thirst, excessive urination, sugar readings persistently higher than 200.     Diet Carb Modified    Complete by:  As directed      Discharge instructions    Complete by:  As directed   You were cared for by Dr. Jacquelynn Cree  (a hospitalist) during your hospital stay. If you have any questions about your discharge medications or the care you received while you were in the hospital after you are discharged, you can call the unit and ask to speak with the hospitalist on call if the hospitalist that took care of you is not available. Once you are discharged, your primary care physician will handle any further medical issues. Please note that NO REFILLS for any discharge medications will be authorized once you are discharged, as it is imperative that you return to your primary care physician (or establish a relationship with a primary care physician if you do not have one) for your aftercare needs so that they can reassess your need for medications and monitor your lab values.  Any outstanding tests can be reviewed by your PCP at your follow up visit.  It is also important to review any medicine changes  with your PCP.  Please bring these d/c instructions with you to your next visit so your physician can review these changes with you.  If you do not have a primary care physician, you can call [phone] for a physician referral.  It is highly recommended that you obtain a PCP for hospital follow up.     Increase activity slowly    Complete by:  As directed             Medication List         alprazolam 2 MG tablet  Commonly known as:  XANAX  Take 2 mg by mouth 2 (two) times daily as needed for anxiety.     FLUoxetine 20  MG capsule  Commonly known as:  PROZAC  Take 20 mg by mouth daily.     insulin aspart protamine- aspart (70-30) 100 UNIT/ML injection  Commonly known as:  NOVOLOG MIX 70/30  Inject 0.1 mLs (10 Units total) into the skin 2 (two) times daily.     insulin glargine 100 UNIT/ML injection  Commonly known as:  LANTUS  Inject 0.15 mLs (15 Units total) into the skin at bedtime.     oxycodone 30 MG immediate release tablet  Commonly known as:  ROXICODONE  Take 30 mg by mouth every 4 (four) hours as needed for pain.     ramipril 10 MG capsule  Commonly known as:  ALTACE  Take 10 mg by mouth every morning.          The results of significant diagnostics from this hospitalization (including imaging, microbiology, ancillary and laboratory) are listed below for reference.     Significant Diagnostic Studies:   Radiographs: Ct Head Wo Contrast  03/19/2014   CLINICAL DATA:  Altered mental status.  Lethargy.  EXAM: CT HEAD WITHOUT CONTRAST  TECHNIQUE: Contiguous axial images were obtained from the base of the skull through the vertex without intravenous contrast.  COMPARISON:  01/21/2014  FINDINGS: There is no evidence of intracranial hemorrhage, brain edema, or other signs of acute infarction. There is no evidence of intracranial mass lesion or mass effect. No abnormal extraaxial fluid collections are identified.  Mild diffuse cerebral atrophy and chronic small vessel disease remains stable. Ventricles are stable in size. No skull abnormality identified.  IMPRESSION: No acute intracranial abnormality.  Stable mild cerebral atrophy and chronic small vessel disease.   Electronically Signed   By: Earle Gell M.D.   On: 03/19/2014 19:12   Dg Chest Port 1 View  03/19/2014   CLINICAL DATA:  Shortness of Breath  EXAM: PORTABLE CHEST - 1 VIEW  COMPARISON:  January 21, 2014  FINDINGS: The lungs are clear. Heart size and pulmonary vascularity are normal. No adenopathy. No bone lesions.  IMPRESSION: No edema or  consolidation.   Electronically Signed   By: Lowella Grip M.D.   On: 03/19/2014 20:52    Labs:  Basic Metabolic Panel:  Recent Labs Lab 03/19/14 2247 03/20/14 0249 03/20/14 0447 03/20/14 0820 03/21/14 0427  NA 137 147 145 148* 142  K 4.0 3.8 3.8 4.3 4.1  CL 102 113* 111 115* 109  CO2 17* 17* 20 20 20   GLUCOSE 732* 262* 168* 98 51*  BUN 47* 41* 41* 37* 23  CREATININE 3.17* 2.60* 2.67* 2.40* 1.62*  CALCIUM 7.7* 7.6* 7.7* 7.5* 7.9*   GFR Estimated Creatinine Clearance: 37.4 ml/min (by C-G formula based on Cr of 1.62). Liver Function Tests:  Recent Labs Lab 03/19/14 1810  AST 14  ALT 12  ALKPHOS 96  BILITOT 0.5  PROT 6.6  ALBUMIN 3.3*    CBC:  Recent Labs Lab 03/19/14 1810 03/20/14 0447 03/21/14 0427  WBC 11.8* 11.2* 9.7  NEUTROABS  --  8.1*  --   HGB 10.1* 10.2* 10.1*  HCT 35.2* 30.3* 29.8*  MCV 104.1* 88.9 87.4  PLT 227 194 206   Cardiac Enzymes:  Recent Labs Lab 03/19/14 1809 03/19/14 1813  CKTOTAL  --  157  TROPONINI <0.30  --    CBG:  Recent Labs Lab 03/21/14 1713 03/21/14 2244 03/22/14 0159 03/22/14 0237 03/22/14 0333  GLUCAP 269* 72 45* 54* 131*   Hgb A1c  Recent Labs  03/19/14 2158  HGBA1C 11.8*   Thyroid function studies  Recent Labs  03/19/14 2230  TSH 1.620   Microbiology Recent Results (from the past 240 hour(s))  CULTURE, BLOOD (ROUTINE X 2)     Status: None   Collection Time    03/19/14  7:40 PM      Result Value Ref Range Status   Specimen Description BLOOD LEFT ARM   Final   Special Requests BOTTLES DRAWN AEROBIC AND ANAEROBIC 4CC   Final   Culture  Setup Time     Final   Value: 03/20/2014 01:51     Performed at [facility]   Culture     Final   Value:        BLOOD CULTURE RECEIVED NO GROWTH TO DATE CULTURE WILL BE HELD FOR 5 DAYS BEFORE ISSUING A FINAL NEGATIVE REPORT     Performed at [facility]   Report Status PENDING   Incomplete  CULTURE, BLOOD (ROUTINE X 2)     Status: None    Collection Time    03/19/14  7:55 PM      Result Value Ref Range Status   Specimen Description BLOOD LEFT HAND   Final   Special Requests BOTTLES DRAWN AEROBIC AND ANAEROBIC 5CC   Final   Culture  Setup Time     Final   Value: 03/20/2014 01:51     Performed at [facility]   Culture     Final   Value:        BLOOD CULTURE RECEIVED NO GROWTH TO DATE CULTURE WILL BE HELD FOR 5 DAYS BEFORE ISSUING A FINAL NEGATIVE REPORT     Performed at [facility]   Report Status PENDING   Incomplete  MRSA PCR SCREENING     Status: None   Collection Time    03/19/14  9:59 PM      Result Value Ref Range Status   MRSA by PCR NEGATIVE  NEGATIVE Final   Comment:            The GeneXpert MRSA Assay (FDA     approved for NASAL specimens     only), is one component of a     comprehensive MRSA colonization     surveillance program. It is not     intended to diagnose MRSA     infection nor to guide or     monitor treatment for     MRSA infections.    Time coordinating discharge: 35 minutes.  Signed:  RAMA,CHRISTINA  Pager [phone] [facility] 03/22/2014, 9:04 AM

## 2014-03-22 NOTE — Progress Notes (Signed)
Patient requesting pain medication for r hip pain. NP paged. Orders received for oxyir prn. Patient also had pulled out IV line when getting oob. Refused to have it replaced. "I am going home in the morning" per patient. Made NP aware.

## 2014-03-22 NOTE — Progress Notes (Addendum)
Hypoglycemic Event  CBG: 54 at 0237  Treatment: 3 glucose tabs  Symptoms: None  Follow-up CBG: Time:0333 CBG Result: 131  Possible Reasons for Event: Medication regimen: insulin and Unknown  Comments/MD notified:given 2 tabs of glucose only     [name] [name]  Remember to initiate Hypoglycemia Order Set & completeAdult Hypoglycemia Protocol Treatment Guidelines  1. RN shall initiate Hypoglycemia Protocol emergency measures immediately when:            w        Routine or STAT CBG and/or a lab glucose indicates hypoglycemia (CBG < 70 mg/dl)  2. Treat the patient according to ability to take PO's and severity of hypoglycemia.   3. If patient is on GlucoStabilizer, follow directions provided by the [facility] for hypoglycemic events.  4. If patient on insulin pump, follow Hypoglycemia Protocol.  If patient requires more than one treatment have patient place pump in SUSPEND and notify MD.  DO NOT leave pump in SUSPEND for greater than 30 minutes unless ordered by MD.  A. Treatment for Mild or Moderate-Patient cooperative and able to swallow    1.  Patient taking PO's and can cooperate   a.  Give one of the following 15 gram CHO options:                           w     1 tube oral dextrose gel                           w     3-4 Glucose tablets                           w     4 oz. Juice                           w     4 oz. regular soda                                    ESRD patients:  clear, regular soda                           w     8 oz. skim milk    b.  Recheck CBG in 15 minutes after treatment                            w       If CBG < 70 mg/dl, repeat treatment and recheck until hypoglycemia is resolved                            w       If CBG > 70 mg/dl and next meal is more than 1 hour away, give additional 15 grams CHO   2.  Patient NPO-Patient cooperative and no altered mental status    a.  Give 25 ml of D50 IV.   b.  Recheck CBG in 15 minutes  after treatment.  w         If CBG is less than 70 mg/dl, repeat treatment and recheck until hypoglycemia is resolved.   c.  Notify MD for further orders.             SPECIAL CONSIDERATIONS:    a.  If no IV access,                              w        Start IV of D5W at [facility]                             w        Give 25 ml of D50 IV.    b.  If unable to gain IV access                             w          Give Glucagon IM:     i.  1 mg if patient weighs more than 45.5 kg     ii.  0.5 mg if patient weighs less than 45.5 kg   c.  Notify MD for further orders  B. Treatment for Severe-- Patient unconscious or unable to take PO's safely    1.  Position patient on side   2.  Give 50 ml D50 IV   3.  Recheck CBG in 15 minutes.                    w      If CBG is less than 70 mg/dl, repeat treatment and recheck until hypoglycemia is resolved.   4.  Notify MD for further orders.    SPECIAL CONSIDERATIONS:    a.  If no IV access                              w     Give Glucagon IM                                              i.  1 mg if patient weighs more than 45.5 kg                                             ii.  0.5 mg if patient weighs less than 45.5 kg                              w      Start IV of D5W at 50 ml/hr and give 50 ml D50 IV   b.  If no IV access and active seizure                               w       Call Rapid Response   c.  If unable to gain IV access, give Glucagon IM:  w          1 mg if patient weighs more than 45.5 kg                              w          0.5 mg if patient weighs less than 45.5 kg   d.  Notify MD for further orders.  C. Complete smart text progress note to document intervention and follow-up CBG   1. In [facility] patient chart, click on Notes (left side of screen)   2. Create Progress Note   3. Click on [employer].  In the Match box type "hypo" and enter    4. Double click on  CHL IP HYPOGLYCEMIC EVENT and enter data   5. MD must be notified if patient is NPO or experienced severe hypoglycemia

## 2014-03-22 NOTE — ED Notes (Signed)
Pt arrived via EMS. PT placed in bed. Pt monitored by bp cuff, 12-Lead, and Pulse ox.

## 2014-03-22 NOTE — Progress Notes (Signed)
Nursing Discharge Summary  Patient ID: Taimur Feicht MRN: [id number] DOB/AGE: 1946-04-14 68 y.o.  Admit date: 03/19/2014 Discharge date: 03/22/2014  Discharged Condition: good  Disposition: Home/self care  Follow-up Information   Follow up with Guadlupe Spanish, MD. Schedule an appointment as soon as possible for a visit in 2 weeks. [facility] follow up on diabetes control.)    Specialty:  Internal Medicine   Contact information:   [id number] [place] [address]       Prescriptions Given: Prescriptions called into [facility]. Patient sent home with Novolog 70/30 insulin from hospital persona vial. Follow up appointments and medications discussed. Patient and daughter both verbalized understanding without further questions.   Means of Discharge: Patient taken downstairs via wheelchair to be discharged home via private vehicle  Signed: Buel Ream 03/22/2014, 9:29 AM

## 2014-03-22 NOTE — Plan of Care (Signed)
Problem: Phase III Progression Outcomes Goal: CBGs stable on SQ insulin Outcome: Not Met (add Reason) Patient with several episodes of hypoglycemia tonight. Asymptomatic each time.

## 2014-03-22 NOTE — Progress Notes (Signed)
Hypoglycemic Event  CBG: 45  Treatment: juice and graham crackers and peanut butter   Symptoms: None  Follow-up CBG: Time:0237 CBG Result:54  Possible Reasons for Event: Medication regimen: medication  and Unknown  Comments/MD notified:     [name] [name]  Remember to initiate Hypoglycemia Order Set & complete

## 2014-03-22 NOTE — ED Notes (Signed)
Pt requesting to go home. Informed pt to at least wait for a physician to see him before he leaves. Pt agreed to wait.

## 2014-03-23 LAB — COMPREHENSIVE METABOLIC PANEL
ALT: 12 U/L (ref 0–53)
AST: 21 U/L (ref 0–37)
Albumin: 3.1 g/dL — ABNORMAL LOW (ref 3.5–5.2)
Alkaline Phosphatase: 89 U/L (ref 39–117)
BUN: 13 mg/dL (ref 6–23)
CO2: 21 mEq/L (ref 19–32)
Calcium: 8.2 mg/dL — ABNORMAL LOW (ref 8.4–10.5)
Chloride: 96 mEq/L (ref 96–112)
Creatinine, Ser: 1.44 mg/dL — ABNORMAL HIGH (ref 0.50–1.35)
GFR calc Af Amer: 56 mL/min — ABNORMAL LOW (ref >90)
GFR calc non Af Amer: 48 mL/min — ABNORMAL LOW (ref >90)
Glucose, Bld: 466 mg/dL — ABNORMAL HIGH (ref 70–99)
Potassium: 3.8 mEq/L (ref 3.7–5.3)
Sodium: 136 mEq/L — ABNORMAL LOW (ref 137–147)
Total Bilirubin: 0.4 mg/dL (ref 0.3–1.2)
Total Protein: 6.7 g/dL (ref 6.0–8.3)

## 2014-03-23 LAB — URINALYSIS, ROUTINE W REFLEX MICROSCOPIC
Bilirubin Urine: NEGATIVE
Glucose, UA: NEGATIVE mg/dL
Hgb urine dipstick: NEGATIVE
Ketones, ur: NEGATIVE mg/dL
Leukocytes, UA: NEGATIVE
Nitrite: NEGATIVE
Protein, ur: NEGATIVE mg/dL
Specific Gravity, Urine: 1.02 (ref 1.005–1.030)
Urobilinogen, UA: 1 mg/dL (ref 0.0–1.0)
pH: 6 (ref 5.0–8.0)

## 2014-03-23 NOTE — Discharge Instructions (Signed)
Continue to monitor your blood sugar. Keep a record of the results and follow these up with your primary Dr. to discuss medication adjustments.  Continue your medications as before.   Confusion Confusion is the inability to think with your usual speed or clarity. Confusion may come on quickly or slowly over time. How quickly the confusion comes on depends on the cause. Confusion can be due to any number of causes. CAUSES   Concussion, head injury, or head trauma.  Seizures.  Stroke.  Fever.  Senility.  Heightened emotional states like rage or terror.  Mental illness in which the person loses the ability to determine what is real and what is not (hallucinations).  Infections.  Toxic effects from alcohol, drugs, or prescription medicines.  Dehydration and an imbalance of salts in the body (electrolytes).  Lack of sleep.  Low blood sugar (diabetes).  Low levels of oxygen (for example from chronic lung disorders).  Drug interactions or other medication side effects.  Nutritional deficiencies, especially niacin, thiamine, vitamin C, or vitamin B.  Sudden drop in body temperature (hypothermia).  Illness in the elderly. Constipation can result in confusion. An elderly person who is hospitalized may become confused due to change in daily routine. SYMPTOMS  People often describe their thinking as cloudy or unclear when they are confused. Confusion can also include feeling disoriented. That means you are unaware of where or who you are. You may also not know what the date or time is. If confused, you may also have difficulty paying attention, remembering and making decisions. Some people also act aggressively when they are confused.  DIAGNOSIS  The medical evaluation of confusion may include:  Blood and urine tests.  X-rays.  Brain and nervous system tests.  Analyzing your brain waves (electroencphalogram or EEG).  A special X-ray (MRI) of your head or other special  studies. Your physician will ask questions such as:  Do you get days and nights mixed up?  Are you awake during regular sleep times?  Do you have trouble recognizing people?  Do you know where you are?  Do you know the date and time?  Does the confusion come and go?  Is the confusion quickly getting worse?  Has there been a recent illness?  Has there been a recent head injury?  Are you diabetic?  Do you have a lung disorder?  What medication are you taking?  Have you taken drugs or alcohol? TREATMENT  An admission to the hospital may not be needed, but a confused person should not be left alone. Stay with a family member or friend until the confusion clears. Avoid alcohol, pain relievers or sedative drugs until you have fully recovered. Do not drive until your caregiver says it is okay. HOME CARE INSTRUCTIONS What family and friends can do:  To find out if someone is confused ask him or her their name, age, and the date. If the person is unsure or answers incorrectly, he or she is confused.  Always introduce yourself, no matter how well the person knows you.  Often remind the person of his or her location.  Place a calendar and clock near the confused person.  Talk about current events and plans for the day.  Try to keep the environment calm, quiet and peaceful.  Make sure the patient keeps follow up appointments with their physician. PREVENTION  Ways to prevent confusion:  Avoid alcohol.  Eat a balanced diet.  Get enough sleep.  Do not become isolated. Spend  time with other people and make plans for your days.  Keep careful watch on your blood sugar levels if you are diabetic. SEEK IMMEDIATE MEDICAL CARE IF:   You develop severe headaches, repeated vomiting, seizures, blackouts or slurred speech.  There is increasing confusion, weakness, numbness, restlessness or personality changes.  You develop a loss of balance, have marked dizziness, feel  uncoordinated or fall.  You have delusions, hallucinations or develop severe anxiety.  Your family members think you need to be rechecked. Document Released: 11/06/2004 Document Revised: 12/22/2011 Document Reviewed: 07/04/2008 [facility] Patient Information 2014 [place], [place].  Hyperglycemia Hyperglycemia occurs when the glucose (sugar) in your blood is too high. Hyperglycemia can happen for many reasons, but it most often happens to people who do not know they have diabetes or are not managing their diabetes properly.  CAUSES  Whether you have diabetes or not, there are other causes of hyperglycemia. Hyperglycemia can occur when you have diabetes, but it can also occur in other situations that you might not be as aware of, such as: Diabetes  If you have diabetes and are having problems controlling your blood glucose, hyperglycemia could occur because of some of the following reasons:  Not following your meal plan.  Not taking your diabetes medications or not taking it properly.  Exercising less or doing less activity than you normally do.  Being sick. Pre-diabetes  This cannot be ignored. Before people develop Type 2 diabetes, they almost always have "pre-diabetes." This is when your blood glucose levels are higher than normal, but not yet high enough to be diagnosed as diabetes. Research has shown that some long-term damage to the body, especially the heart and circulatory system, may already be occurring during pre-diabetes. If you take action to manage your blood glucose when you have pre-diabetes, you may delay or prevent Type 2 diabetes from developing. Stress  If you have diabetes, you may be "diet" controlled or on oral medications or insulin to control your diabetes. However, you may find that your blood glucose is higher than usual in the hospital whether you have diabetes or not. This is often referred to as "stress hyperglycemia." Stress can elevate your blood glucose. This  happens because of hormones put out by the body during times of stress. If stress has been the cause of your high blood glucose, it can be followed regularly by your caregiver. That way he/she can make sure your hyperglycemia does not continue to get worse or progress to diabetes. Steroids  Steroids are medications that act on the infection fighting system (immune system) to block inflammation or infection. One side effect can be a rise in blood glucose. Most people can produce enough extra insulin to allow for this rise, but for those who cannot, steroids make blood glucose levels go even higher. It is not unusual for steroid treatments to "uncover" diabetes that is developing. It is not always possible to determine if the hyperglycemia will go away after the steroids are stopped. A special blood test called an A1c is sometimes done to determine if your blood glucose was elevated before the steroids were started. SYMPTOMS  Thirsty.  Frequent urination.  Dry mouth.  Blurred vision.  Tired or fatigue.  Weakness.  Sleepy.  Tingling in feet or leg. DIAGNOSIS  Diagnosis is made by monitoring blood glucose in one or all of the following ways:  A1c test. This is a chemical found in your blood.  Fingerstick blood glucose monitoring.  Laboratory results. TREATMENT  First, knowing the cause of the hyperglycemia is important before the hyperglycemia can be treated. Treatment may include, but is not be limited to:  Education.  Change or adjustment in medications.  Change or adjustment in meal plan.  Treatment for an illness, infection, etc.  More frequent blood glucose monitoring.  Change in exercise plan.  Decreasing or stopping steroids.  Lifestyle changes. HOME CARE INSTRUCTIONS   Test your blood glucose as directed.  Exercise regularly. Your caregiver will give you instructions about exercise. Pre-diabetes or diabetes which comes on with stress is helped by  exercising.  Eat wholesome, balanced meals. Eat often and at regular, fixed times. Your caregiver or nutritionist will give you a meal plan to guide your sugar intake.  Being at an ideal weight is important. If needed, losing as little as 10 to 15 pounds may help improve blood glucose levels. SEEK MEDICAL CARE IF:   You have questions about medicine, activity, or diet.  You continue to have symptoms (problems such as increased thirst, urination, or weight gain). SEEK IMMEDIATE MEDICAL CARE IF:   You are vomiting or have diarrhea.  Your breath smells fruity.  You are breathing faster or slower.  You are very sleepy or incoherent.  You have numbness, tingling, or pain in your feet or hands.  You have chest pain.  Your symptoms get worse even though you have been following your caregiver's orders.  If you have any other questions or concerns. Document Released: 03/25/2001 Document Revised: 12/22/2011 Document Reviewed: 01/26/2012 [facility] Patient Information 2014 [place], [place].

## 2014-03-23 NOTE — ED Notes (Signed)
Dr. Delo at bedside. 

## 2014-03-23 NOTE — ED Notes (Addendum)
Pt. Alert and oriented x4. Verbalized understanding of discharge instruction, denies needs at this time.  Stable upon discharge, family to take patient home.

## 2014-03-23 NOTE — ED Provider Notes (Signed)
CSN: [id number]     Arrival date & time 03/22/14  2251 History   First MD Initiated Contact with Patient 03/22/14 2358     Chief Complaint  Patient presents with  . Altered Mental Status  . Seizures  . Hyperglycemia     (Consider location/radiation/quality/duration/timing/severity/associated sxs/prior Treatment) HPI Comments: Patient is a 68 year old male with history of diabetes and hypertension. He was recently hospitalized at [facility] long for DKA. He was discharged earlier this morning. This evening he seemed "not quite himself" and was brought here for evaluation. It was felt that he may have had a seizure. Patient denies having this and is uncertain as to how this became mentioned as a possibility. He reports drinking some wine this evening, but states it was not to excess. He does not feel as though he needs to be here and is requesting to go home.  Patient is a 68 y.o. male presenting with altered mental status, seizures, and hyperglycemia. The history is provided by the patient.  Altered Mental Status Presenting symptoms: behavior changes   Severity:  Moderate Most recent episode:  Today Episode history:  Single Timing:  Constant Progression:  Partially resolved Chronicity:  New Context: alcohol use   Associated symptoms: seizures   Seizures Hyperglycemia Associated symptoms: altered mental status     Past Medical History  Diagnosis Date  . Diabetes mellitus without complication   . Hypertension   . Hernia   . Back pain   . Seizures    Past Surgical History  Procedure Laterality Date  . No past surgeries    . Hip arthroplasty Right 02/05/2013    Procedure: ARTHROPLASTY BIPOLAR HIP;  Surgeon: [name], MD;  Location: [facility];  Service: Orthopedics;  Laterality: Right;   No family history on file. History  Substance Use Topics  . Smoking status: Former Smoker -- 0.25 packs/day for 30 years    Types: Cigarettes    Quit date: 01/31/2013  . Smokeless tobacco:  Never Used  . Alcohol Use: Yes    Review of Systems  Neurological: Positive for seizures.  All other systems reviewed and are negative.     Allergies  Review of patient's allergies indicates no known allergies.  Home Medications   Prior to Admission medications   Medication Sig Start Date End Date Taking? Authorizing Provider  alprazolam [name]) 2 MG tablet Take 2 mg by mouth 2 (two) times daily as needed for anxiety.  06/10/13  Yes Historical Provider, MD  FLUoxetine (PROZAC) 20 MG capsule Take 20 mg by mouth daily.   Yes Historical Provider, MD  insulin aspart protamine- aspart (NOVOLOG MIX 70/30) (70-30) 100 UNIT/ML injection Inject 0.1 mLs (10 Units total) into the skin 2 (two) times daily. 03/22/14  Yes Christina P Rama, MD  insulin glargine (LANTUS) 100 UNIT/ML injection Inject 0.15 mLs (15 Units total) into the skin at bedtime. 03/22/14  Yes Christina P Rama, MD  oxycodone (ROXICODONE) 30 MG immediate release tablet Take 30 mg by mouth every 4 (four) hours as needed for pain.    Yes Historical Provider, MD  ramipril (ALTACE) 10 MG capsule Take 10 mg by mouth every morning.   Yes Historical Provider, MD   BP 152/68  Pulse 94  Temp(Src) 98.8 F (37.1 C)  Resp 13  SpO2 100% Physical Exam  Nursing note and vitals reviewed. Constitutional: He is oriented to person, place, and time. He appears well-developed and well-nourished. No distress.  The patient appears awake, alert, and oriented.  He is appropriate and I do not smell the odor of alcohol.  HENT:  Head: Normocephalic and atraumatic.  Mouth/Throat: Oropharynx is clear and moist.  Eyes: EOM are normal. Pupils are equal, round, and reactive to light.  Neck: Normal range of motion. Neck supple.  Cardiovascular: Normal rate, regular rhythm and normal heart sounds.   No murmur heard. Pulmonary/Chest: Effort normal and breath sounds normal. No respiratory distress. He has no wheezes.  Abdominal: Soft. Bowel sounds are normal.  He exhibits no distension. There is no tenderness.  Musculoskeletal: Normal range of motion. He exhibits no edema.  Neurological: He is alert and oriented to person, place, and time. No cranial nerve deficit. He exhibits normal muscle tone. Coordination normal.  Skin: Skin is warm and dry. He is not diaphoretic.    ED Course  Procedures (including critical care time) Labs Review Labs Reviewed  CBC - Abnormal; Notable for the following:    RBC 3.75 (*)    Hemoglobin 11.1 (*)    HCT 33.1 (*)    All other components within normal limits  COMPREHENSIVE METABOLIC PANEL - Abnormal; Notable for the following:    Sodium 136 (*)    Glucose, Bld 466 (*)    Creatinine, Ser 1.44 (*)    Calcium 8.2 (*)    Albumin 3.1 (*)    GFR calc non Af Amer 48 (*)    GFR calc Af Amer 56 (*)    All other components within normal limits  CBG MONITORING, ED - Abnormal; Notable for the following:    Glucose-Capillary 398 (*)    All other components within normal limits  URINALYSIS, ROUTINE W REFLEX MICROSCOPIC    Imaging Review No results found.   Date: 03/23/2014  Rate: 83  Rhythm: normal sinus rhythm  QRS Axis: left  Intervals: normal  ST/T Wave abnormalities: normal  Conduction Disutrbances:none  Narrative Interpretation:   Old EKG Reviewed: unchanged    MDM   Final diagnoses:  None   Patient was brought here for evaluation of confusion and disorientation. Workup reveals an elevated glucose of 400, however the remainder of the workup is unremarkable. He does not want to be here and is requesting to be discharged. He states he was drinking some wine earlier, however does not appear to be intoxicated. He is cooperative and neurologically intact. I feel as though he is appropriate for discharge. He is to return if his symptoms significantly worsen or change.     Veryl Speak, MD 03/23/14 [phone]

## 2014-03-24 LAB — GLUCOSE, CAPILLARY: Glucose-Capillary: 63 mg/dL — ABNORMAL LOW (ref 70–99)

## 2014-03-26 LAB — CULTURE, BLOOD (ROUTINE X 2)
Culture: NO GROWTH
Culture: NO GROWTH

## 2014-03-27 ENCOUNTER — Encounter (HOSPITAL_COMMUNITY): Payer: Self-pay | Admitting: Emergency Medicine

## 2014-03-27 ENCOUNTER — Emergency Department (HOSPITAL_COMMUNITY)
Admission: EM | Admit: 2014-03-27 | Discharge: 2014-03-27 | Discharge status: Against Medical Advice | Payer: PRIVATE HEALTH INSURANCE | Attending: Emergency Medicine | Admitting: Emergency Medicine

## 2014-03-27 DIAGNOSIS — E119 Type 2 diabetes mellitus without complications: Secondary | ICD-10-CM | POA: Insufficient documentation

## 2014-03-27 DIAGNOSIS — I10 Essential (primary) hypertension: Secondary | ICD-10-CM | POA: Insufficient documentation

## 2014-03-27 DIAGNOSIS — G40909 Epilepsy, unspecified, not intractable, without status epilepticus: Secondary | ICD-10-CM | POA: Insufficient documentation

## 2014-03-27 DIAGNOSIS — Z87891 Personal history of nicotine dependence: Secondary | ICD-10-CM | POA: Insufficient documentation

## 2014-03-27 DIAGNOSIS — R739 Hyperglycemia, unspecified: Secondary | ICD-10-CM

## 2014-03-27 DIAGNOSIS — R569 Unspecified convulsions: Secondary | ICD-10-CM

## 2014-03-27 DIAGNOSIS — Z794 Long term (current) use of insulin: Secondary | ICD-10-CM | POA: Insufficient documentation

## 2014-03-27 DIAGNOSIS — Z8719 Personal history of other diseases of the digestive system: Secondary | ICD-10-CM | POA: Insufficient documentation

## 2014-03-27 DIAGNOSIS — Z79899 Other long term (current) drug therapy: Secondary | ICD-10-CM | POA: Insufficient documentation

## 2014-03-27 LAB — CBG MONITORING, ED: Glucose-Capillary: 466 mg/dL — ABNORMAL HIGH (ref 70–99)

## 2014-03-27 NOTE — ED Notes (Signed)
Dr. Leonides Schanz made aware that pt is wanting to leave AMA.

## 2014-03-27 NOTE — ED Provider Notes (Signed)
TIME SEEN: 10:00 PM  CHIEF COMPLAINT: Possible seizure  HPI: Pt is a 68 y.o. M with history of hypertension, diabetes, prior hypoglycemic seizures who presents the emergency department with a possible seizure today. Information was provided by patient's family who was not present during this episode. Per patient's remained, there was concern the patient was unresponsive today. Upon EMSs arrival, patient was unresponsive, posturing and had sonorous respirations. They gave Narcan with no resolution. They then gave the patient Versed which improved his symptoms. His glucose was 395 on their arrival. There is also concern for possible substance abuse as a small bag of white powder was found in his lap. Patient denies any current complaints. He denies any chest pain or shortness of breath today. No numbness or focal weakness. No headache. No fever. He was recently admitted to the hospital June 7 for DKA and acute encephalopathy. He is not on any antiepileptics.  ROS: See HPI Constitutional: no fever  Eyes: no drainage  ENT: no runny nose   Cardiovascular:  no chest pain  Resp: no SOB  GI: no vomiting GU: no dysuria Integumentary: no rash  Allergy: no hives  Musculoskeletal: no leg swelling  Neurological: no slurred speech ROS otherwise negative  PAST MEDICAL HISTORY/PAST SURGICAL HISTORY:  Past Medical History  Diagnosis Date  . Diabetes mellitus without complication   . Hypertension   . Hernia   . Back pain   . Seizures     MEDICATIONS:  Prior to Admission medications   Medication Sig Start Date End Date Taking? Authorizing Provider  alprazolam [name]) 2 MG tablet Take 2 mg by mouth 2 (two) times daily as needed for anxiety.  06/10/13   Historical Provider, MD  FLUoxetine (PROZAC) 20 MG capsule Take 20 mg by mouth daily.    Historical Provider, MD  insulin aspart protamine- aspart (NOVOLOG MIX 70/30) (70-30) 100 UNIT/ML injection Inject 0.1 mLs (10 Units total) into the skin 2 (two)  times daily. 03/22/14   Venetia Maxon Rama, MD  insulin glargine (LANTUS) 100 UNIT/ML injection Inject 0.15 mLs (15 Units total) into the skin at bedtime. 03/22/14   Christina P Rama, MD  oxycodone (ROXICODONE) 30 MG immediate release tablet Take 30 mg by mouth every 4 (four) hours as needed for pain.     Historical Provider, MD  ramipril (ALTACE) 10 MG capsule Take 10 mg by mouth every morning.    Historical Provider, MD    ALLERGIES:  No Known Allergies  SOCIAL HISTORY:  History  Substance Use Topics  . Smoking status: Former Smoker -- 0.25 packs/day for 30 years    Types: Cigarettes    Quit date: 01/31/2013  . Smokeless tobacco: Never Used  . Alcohol Use: Yes    FAMILY HISTORY: No family history on file.  EXAM: BP 157/69  Pulse 84  Temp(Src) 97.7 F (36.5 C) (Oral)  Resp 19  SpO2 99% CONSTITUTIONAL: Alert and oriented x3 and responds appropriately to questions. Well-appearing; well-nourished HEAD: Normocephalic EYES: Conjunctivae clear, PERRL ENT: normal nose; no rhinorrhea; moist mucous membranes; pharynx without lesions noted NECK: Supple, no meningismus, no LAD  CARD: RRR; S1 and S2 appreciated; no murmurs, no clicks, no rubs, no gallops RESP: Normal chest excursion without splinting or tachypnea; breath sounds clear and equal bilaterally; no wheezes, no rhonchi, no rales,  ABD/GI: Normal bowel sounds; non-distended; soft, non-tender, no rebound, no guarding BACK:  The back appears normal and is non-tender to palpation, there is no CVA tenderness EXT: Normal ROM  in all joints; non-tender to palpation; no edema; normal capillary refill; no cyanosis    SKIN: Normal color for age and race; warm NEURO: Moves all extremities equally, sensation to light touch intact diffusely, cranial nerves II through XII intact PSYCH: The patient's mood and manner are appropriate. Grooming and personal hygiene are appropriate.  MEDICAL DECISION MAKING: Patient here with possible seizure. Does  not appear that patient has had seizures otherwise without being in the setting of hypoglycemia. Have recommended checking labs, urine, head CT the patient refused any workup today. He is oriented x3 and has capacity to make this decision. He does not appear intoxicated. Have recommended that we try to treat his hyperglycemia but he refuses. Family will take the patient home. Have recommended close outpatient followup.        Tees Toh, DO 03/28/14 [phone]

## 2014-03-27 NOTE — ED Notes (Signed)
Bed: RESB Expected date:  Expected time:  Means of arrival:  Comments: EMS dec LOC, posturing, ? Seizure, ? Overdose, no improvement with Narcan

## 2014-03-27 NOTE — ED Notes (Signed)
Pt says that he believes he is here because his blood sugar was high. Denies any thoughts of SI/HI.

## 2014-03-27 NOTE — ED Notes (Signed)
Pt presents by EMS with c/o altered mental status and possible overdose. When EMS arrived, he was unresponsive, posturing, and his eyes were rolled back in his head. Pt has a hx of seizures as well. Pt had snoring respirations on EMS arrival, 1 Narcan intranasal given by EMS, pt also given 2.5 versed intranasal and after that pt began to relax his muscles and started to respond. Pt was found with a small bag of white powder in his lap when EMS arrived. CBG was 395 on arrival. Pt is awake at this time and able to answer questions.

## 2014-03-28 LAB — CBG MONITORING, ED: Glucose-Capillary: 425 mg/dL — ABNORMAL HIGH (ref 70–99)

## 2014-04-11 IMAGING — CR DG HIP COMPLETE 2+V*R*
3 series · 3 of 3 positions shown · non-contrast
Comparison: None.

CLINICAL DATA: 66-year-old male with fall 2 weeks ago.  Pain.

RIGHT HIP - COMPLETE 2+ VIEW

[t pelvis ap]
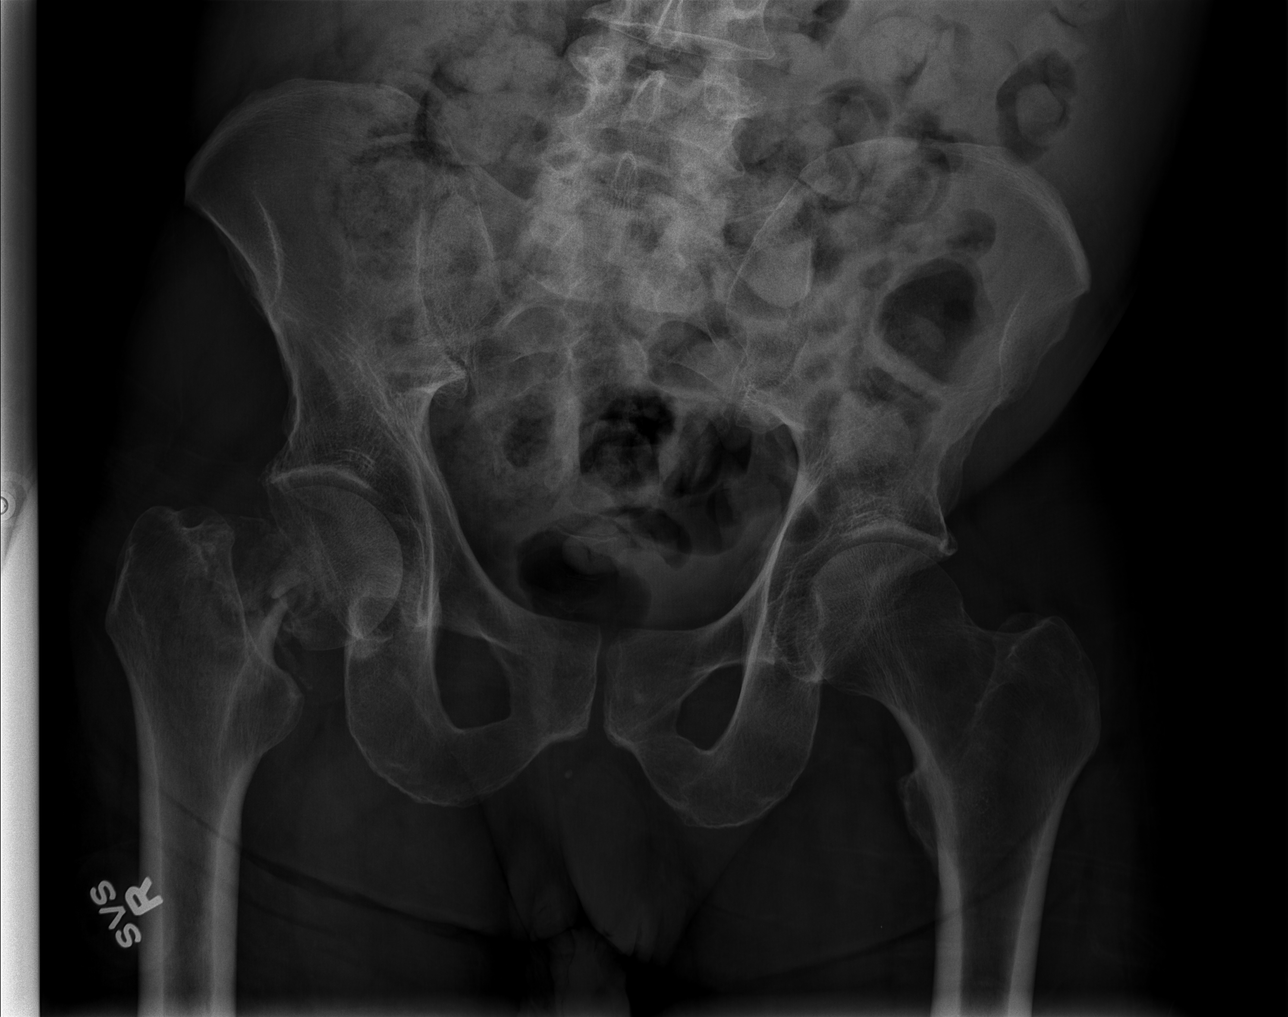

[t hip ap right]
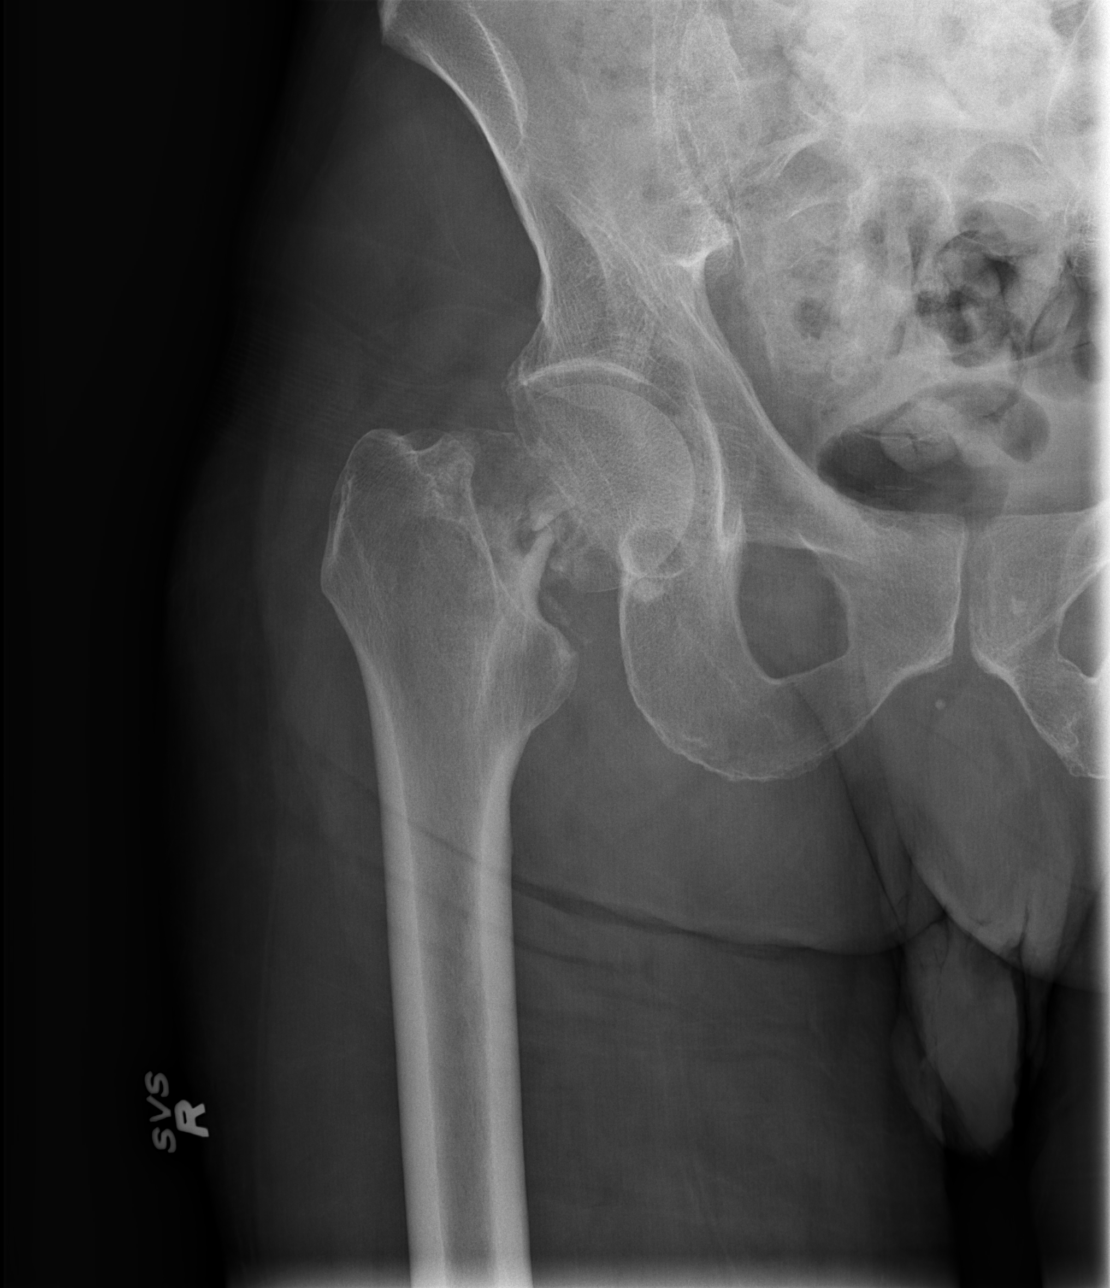

[w hip lat right]
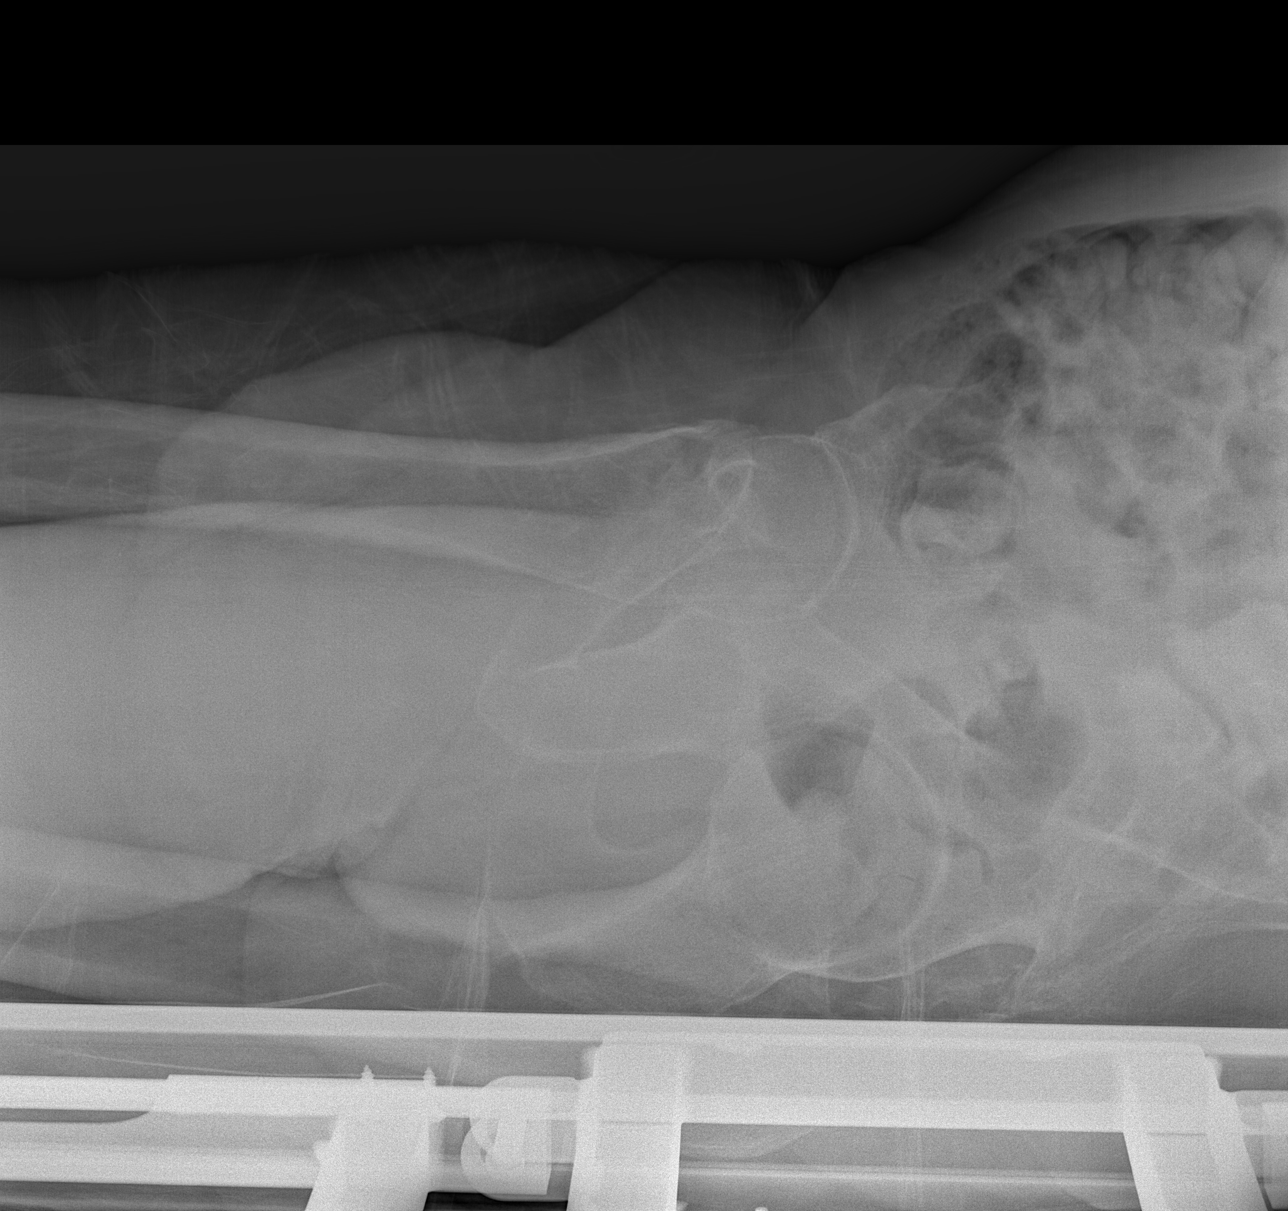

[3 of 3 positions shown; findings below may reference images not displayed]

FINDINGS: Comminuted right femoral neck fracture with varus
impaction.  Evidence of periosteal reaction.  Suggestion of areas
of bone resorption.  Areas of sclerosis.

Visible right femur from the trochanter distally is otherwise
intact.  Grossly intact proximal left femur.  Pelvis intact.
IMPRESSION: Subacute comminuted right femoral neck fracture with varus
impaction.
Given the altered bone mineralization is difficult to exclude this
as a pathologic fracture.

## 2014-04-11 IMAGING — CR DG CHEST 2V
2 series · 2 of 2 positions shown · non-contrast
Comparison: None.

CLINICAL DATA: 66-year-old male preoperative study of fracture.

CHEST - 2 VIEW

[w chest lat]
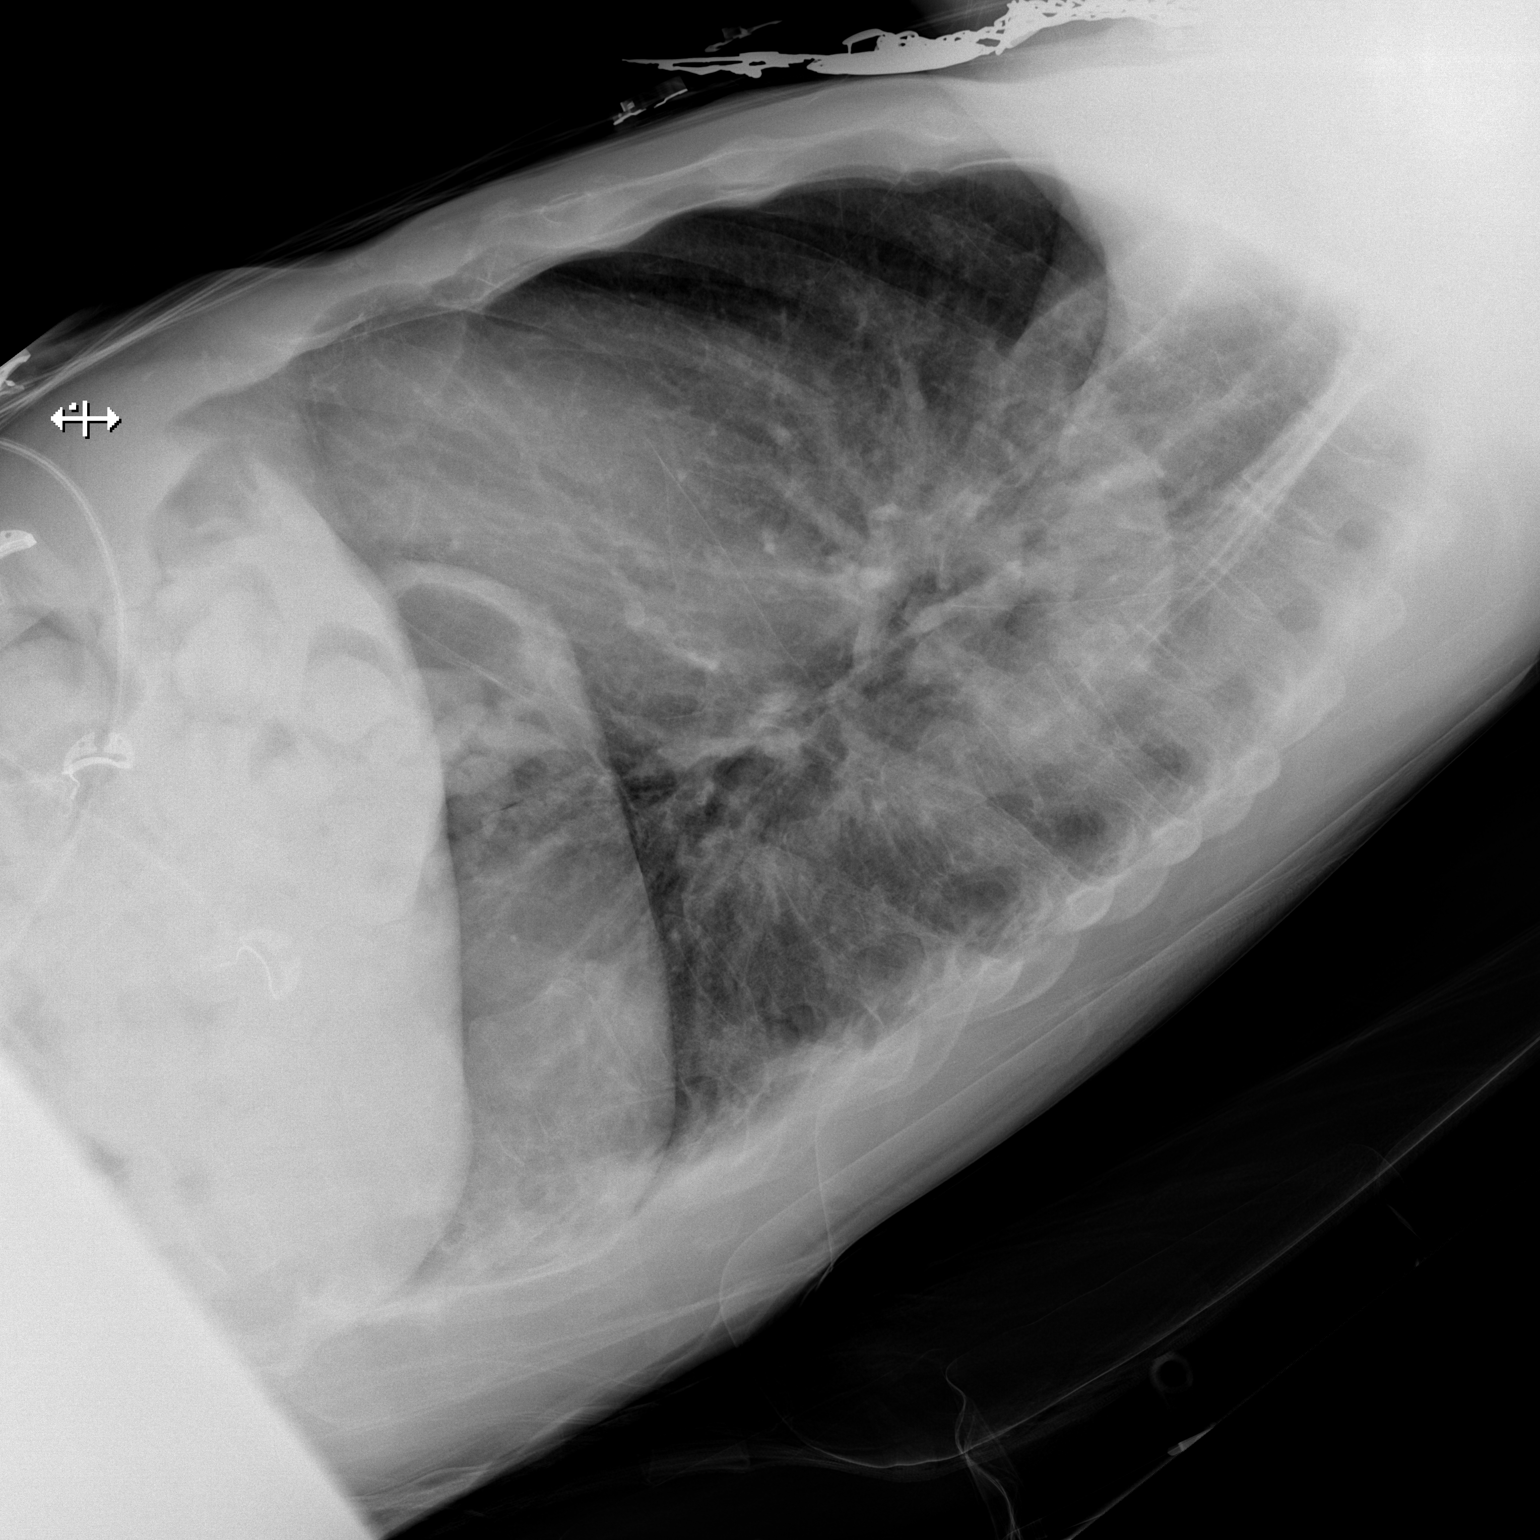

[x chest ap]
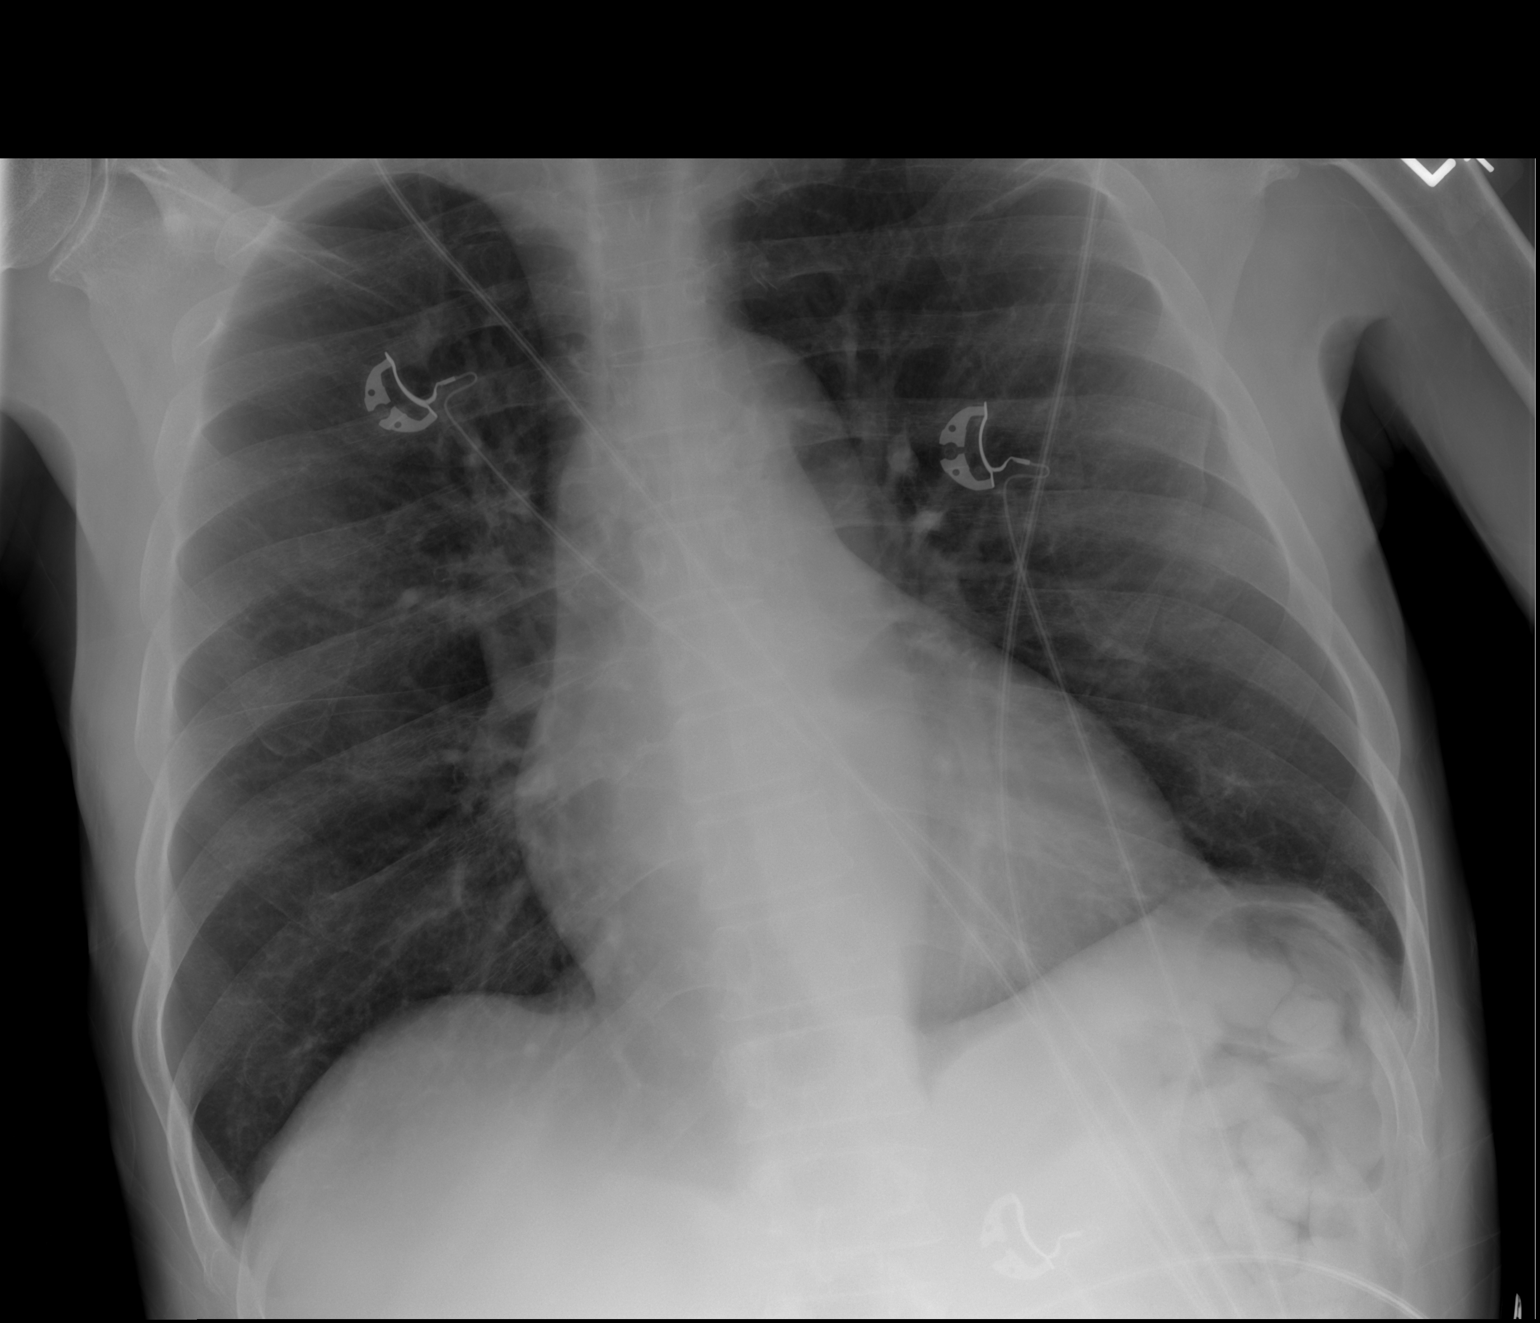

[2 of 2 positions shown; findings below may reference images not displayed]

FINDINGS: Semi upright AP and lateral views of the chest.  Cardiac
size at the upper limits of normal. Other mediastinal contours are
within normal limits.  Visualized tracheal air column is within
normal limits.  The lungs are clear.  No pneumothorax or effusion.
IMPRESSION: No acute cardiopulmonary abnormality.

## 2014-04-14 IMAGING — CR DG HIP 1V PORT*R*
1 series · 1 of 1 positions shown · non-contrast
Comparison: 02/02/2013

CLINICAL DATA: Post right hip replacement

PORTABLE RIGHT HIP - 1 VIEW

[AP]
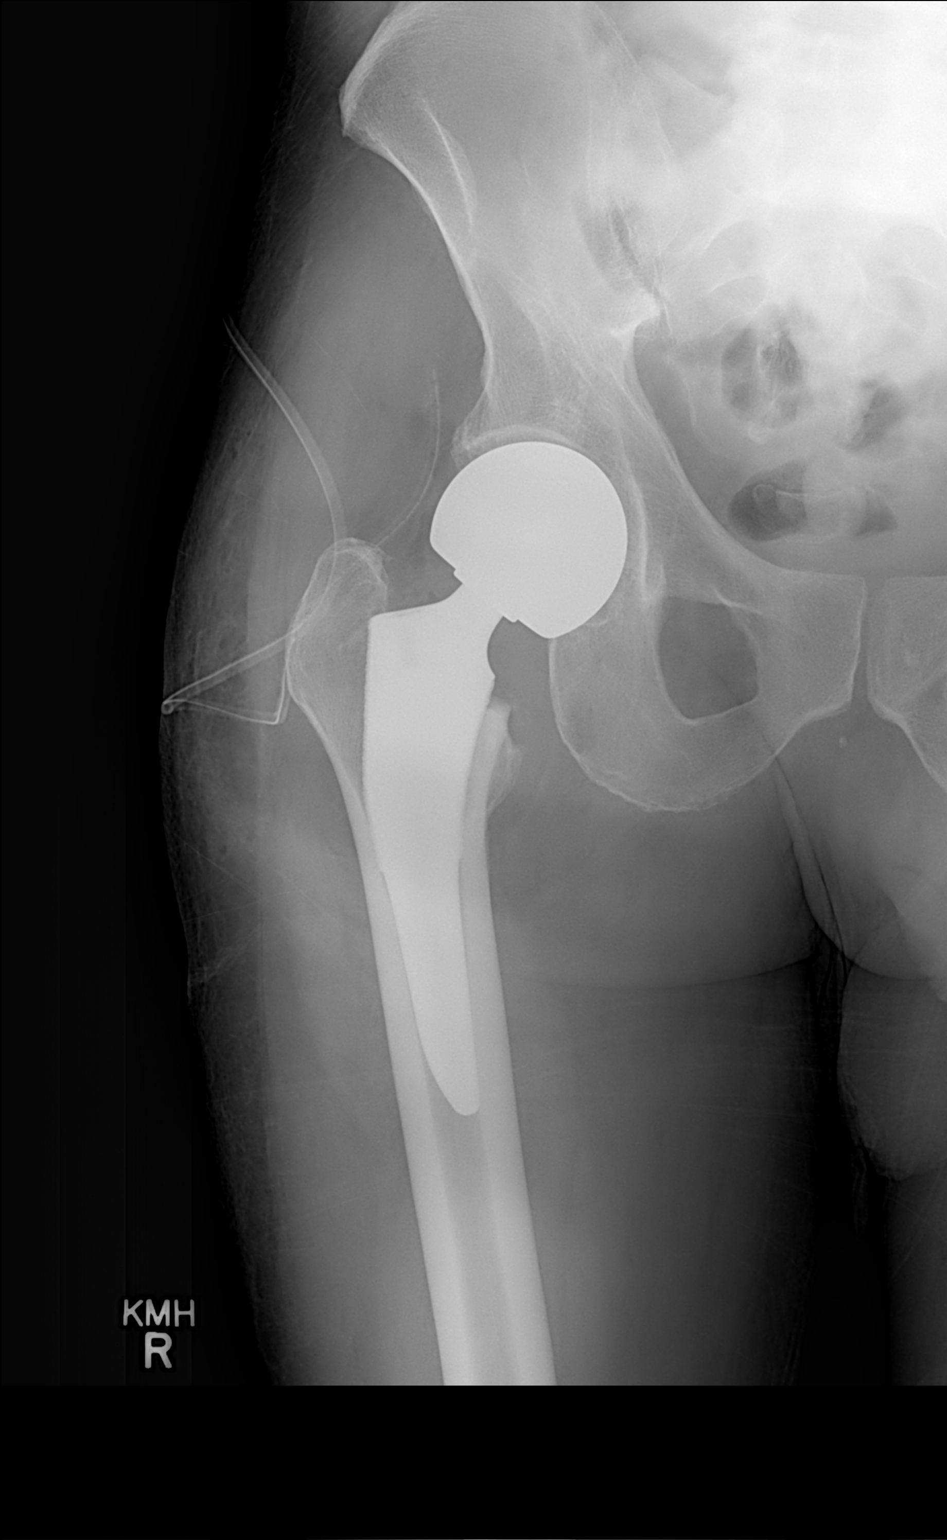

[1 of 1 positions shown; findings below may reference images not displayed]

FINDINGS: Post right hip hemiarthroplasty without evidence of
fracture, hardware failure or loosening on this AP projection
radiograph.  Alignment appears near anatomic. A surgical drain
overlies the superior lateral aspect of the operative site.  There
is a minimal amount of adjacent expected subcutaneous emphysema and
soft tissue stranding.  There is a small amount of intra-articular
air.  No radiopaque foreign body.
IMPRESSION: Post right hip hemiarthroplasty without evidence of complication.

## 2014-06-15 ENCOUNTER — Encounter (INDEPENDENT_AMBULATORY_CARE_PROVIDER_SITE_OTHER): Payer: Medicare Other | Admitting: Ophthalmology

## 2014-06-15 DIAGNOSIS — E1139 Type 2 diabetes mellitus with other diabetic ophthalmic complication: Secondary | ICD-10-CM

## 2014-06-15 DIAGNOSIS — I10 Essential (primary) hypertension: Secondary | ICD-10-CM

## 2014-06-15 DIAGNOSIS — H2510 Age-related nuclear cataract, unspecified eye: Secondary | ICD-10-CM

## 2014-06-15 DIAGNOSIS — E1165 Type 2 diabetes mellitus with hyperglycemia: Secondary | ICD-10-CM | POA: Diagnosis not present

## 2014-06-15 DIAGNOSIS — H43819 Vitreous degeneration, unspecified eye: Secondary | ICD-10-CM

## 2014-06-15 DIAGNOSIS — E11319 Type 2 diabetes mellitus with unspecified diabetic retinopathy without macular edema: Secondary | ICD-10-CM | POA: Diagnosis not present

## 2014-06-15 DIAGNOSIS — H348392 Tributary (branch) retinal vein occlusion, unspecified eye, stable: Secondary | ICD-10-CM | POA: Diagnosis not present

## 2014-06-15 DIAGNOSIS — H35039 Hypertensive retinopathy, unspecified eye: Secondary | ICD-10-CM | POA: Diagnosis not present

## 2014-06-28 ENCOUNTER — Encounter (HOSPITAL_COMMUNITY): Payer: Self-pay | Admitting: Emergency Medicine

## 2014-06-28 ENCOUNTER — Emergency Department (HOSPITAL_COMMUNITY)
Admission: EM | Admit: 2014-06-28 | Discharge: 2014-06-28 | Disposition: A | Discharge status: Home / Self Care | Payer: Medicare Other | Attending: Emergency Medicine | Admitting: Emergency Medicine

## 2014-06-28 DIAGNOSIS — E1169 Type 2 diabetes mellitus with other specified complication: Secondary | ICD-10-CM | POA: Insufficient documentation

## 2014-06-28 DIAGNOSIS — Z8719 Personal history of other diseases of the digestive system: Secondary | ICD-10-CM | POA: Diagnosis not present

## 2014-06-28 DIAGNOSIS — Z79899 Other long term (current) drug therapy: Secondary | ICD-10-CM | POA: Insufficient documentation

## 2014-06-28 DIAGNOSIS — Z794 Long term (current) use of insulin: Secondary | ICD-10-CM | POA: Insufficient documentation

## 2014-06-28 DIAGNOSIS — Z87891 Personal history of nicotine dependence: Secondary | ICD-10-CM | POA: Insufficient documentation

## 2014-06-28 DIAGNOSIS — I10 Essential (primary) hypertension: Secondary | ICD-10-CM

## 2014-06-28 DIAGNOSIS — E160 Drug-induced hypoglycemia without coma: Secondary | ICD-10-CM

## 2014-06-28 DIAGNOSIS — T383X5A Adverse effect of insulin and oral hypoglycemic [antidiabetic] drugs, initial encounter: Secondary | ICD-10-CM

## 2014-06-28 LAB — URINALYSIS, ROUTINE W REFLEX MICROSCOPIC
Bilirubin Urine: NEGATIVE
Glucose, UA: 250 mg/dL — AB
Hgb urine dipstick: NEGATIVE
Ketones, ur: 15 mg/dL — AB
Leukocytes, UA: NEGATIVE
Nitrite: NEGATIVE
Protein, ur: 100 mg/dL — AB
Specific Gravity, Urine: 1.018 (ref 1.005–1.030)
Urobilinogen, UA: 0.2 mg/dL (ref 0.0–1.0)
pH: 5 (ref 5.0–8.0)

## 2014-06-28 LAB — I-STAT CHEM 8, ED
BUN: 13 mg/dL (ref 6–23)
Calcium, Ion: 1.08 mmol/L — ABNORMAL LOW (ref 1.13–1.30)
Chloride: 105 mEq/L (ref 96–112)
Creatinine, Ser: 1.9 mg/dL — ABNORMAL HIGH (ref 0.50–1.35)
Glucose, Bld: 181 mg/dL — ABNORMAL HIGH (ref 70–99)
HCT: 41 % (ref 39.0–52.0)
Hemoglobin: 13.9 g/dL (ref 13.0–17.0)
Potassium: 3.5 mEq/L — ABNORMAL LOW (ref 3.7–5.3)
Sodium: 137 mEq/L (ref 137–147)
TCO2: 25 mmol/L (ref 0–100)

## 2014-06-28 LAB — CBC WITH DIFFERENTIAL/PLATELET
Basophils Absolute: 0 10*3/uL (ref 0.0–0.1)
Basophils Relative: 0 % (ref 0–1)
Eosinophils Absolute: 0.2 10*3/uL (ref 0.0–0.7)
Eosinophils Relative: 2 % (ref 0–5)
HCT: 36.8 % — ABNORMAL LOW (ref 39.0–52.0)
Hemoglobin: 12.4 g/dL — ABNORMAL LOW (ref 13.0–17.0)
Lymphocytes Relative: 14 % (ref 12–46)
Lymphs Abs: 1.4 10*3/uL (ref 0.7–4.0)
MCH: 30.3 pg (ref 26.0–34.0)
MCHC: 33.7 g/dL (ref 30.0–36.0)
MCV: 90 fL (ref 78.0–100.0)
Monocytes Absolute: 0.8 10*3/uL (ref 0.1–1.0)
Monocytes Relative: 9 % (ref 3–12)
Neutro Abs: 7.3 10*3/uL (ref 1.7–7.7)
Neutrophils Relative %: 75 % (ref 43–77)
Platelets: 213 10*3/uL (ref 150–400)
RBC: 4.09 MIL/uL — ABNORMAL LOW (ref 4.22–5.81)
RDW: 13.2 % (ref 11.5–15.5)
WBC: 9.8 10*3/uL (ref 4.0–10.5)

## 2014-06-28 LAB — URINE MICROSCOPIC-ADD ON

## 2014-06-28 LAB — I-STAT TROPONIN, ED: Troponin i, poc: 0 ng/mL (ref 0.00–0.08)

## 2014-06-28 LAB — CBG MONITORING, ED: Glucose-Capillary: 140 mg/dL — ABNORMAL HIGH (ref 70–99)

## 2014-06-28 MED ORDER — SODIUM CHLORIDE 0.9 % IV BOLUS (SEPSIS)
1000.0000 mL | Freq: Once | INTRAVENOUS | Status: AC
  Administered 2014-06-28: 1000 mL via INTRAVENOUS

## 2014-06-28 MED ORDER — RAMIPRIL 10 MG PO CAPS
10.0000 mg | ORAL_CAPSULE | Freq: Every day | ORAL | Status: DC
  Administered 2014-06-28: 10 mg via ORAL
  Filled 2014-06-28: qty 1

## 2014-06-28 NOTE — ED Notes (Signed)
[facility] notified that EMS unable to obtain IV after 2 sticks, attempted here as well, and unsuccessful

## 2014-06-28 NOTE — ED Notes (Signed)
Per ems, pt was found unresponsive at home with snoring respirations; BS on arrival was 17 and HR was 40, EMS unable to gain IV access, gave pt glucagon IM, last BS 44; BP 200/100, HR 60

## 2014-06-28 NOTE — Discharge Instructions (Signed)
Your blood sugar was low this morning, this is likely due to taking insulin without adequate monitoring.  Make sure to check your blood sugar before using insulin, and eat appropriately.  Your kidney function is abnormal, please stay hydrated and have it recheck by your doctor next week.  Take your blood pressure medication as prescribed.  Return to ER if your condition worsen or if you have other concerns.    Low Blood Sugar Low blood sugar (hypoglycemia) means that the level of sugar in your blood is lower than it should be. Signs of low blood sugar include:  Getting sweaty.  Feeling hungry.  Feeling dizzy or weak.  Feeling sleepier than normal.  Feeling nervous.  Headaches.  Having a fast heartbeat. Low blood sugar can happen fast and can be an emergency. Your doctor can do tests to check your blood sugar level. You can have low blood sugar and not have diabetes. HOME CARE  Check your blood sugar as told by your doctor. If it is less than 70 mg/dl or as told by your doctor, take 1 of the following:  3 to 4 glucose tablets.   cup clear juice.   cup soda pop, not diet.  1 cup milk.  5 to 6 hard candies.  Recheck blood sugar after 15 minutes. Repeat until it is at the right level.  Eat a snack if it is more than 1 hour until the next meal.  Only take medicine as told by your doctor.  Do not skip meals. Eat on time.  Do not drink alcohol except with meals.  Check your blood glucose before driving.  Check your blood glucose before and after exercise.  Always carry treatment with you, such as glucose pills.  Always wear a medical alert bracelet if you have diabetes. GET HELP RIGHT AWAY IF:   Your blood glucose goes below 70 mg/dl or as told by your doctor, and you:  Are confused.  Are not able to swallow.  Pass out (faint).  You cannot treat yourself. You may need someone to help you.  You have low blood sugar problems often.  You have problems from your  medicines.  You are not feeling better after 3 to 4 days.  You have vision changes. MAKE SURE YOU:   Understand these instructions.  Will watch this condition.  Will get help right away if you are not doing well or get worse. Document Released: 12/24/2009 Document Revised: 12/22/2011 Document Reviewed: 12/24/2009 [facility] Patient Information 2015 [place], [place]. This information is not intended to replace advice given to you by your health care provider. Make sure you discuss any questions you have with your health care provider.

## 2014-06-28 NOTE — ED Notes (Signed)
CBG is 140. Notified Nurse Jovita Kussmaul.

## 2014-06-28 NOTE — ED Provider Notes (Signed)
CSN: [id number]     Arrival date & time 06/28/14  1426 History   First MD Initiated Contact with Patient 06/28/14 1433     Chief Complaint  Patient presents with  . Hypoglycemia     (Consider location/radiation/quality/duration/timing/severity/associated sxs/prior Treatment) HPI  68 year old male with history of insulin-dependent diabetes, hypertension, and seizure, back pain and was brought here via EMS for evaluation of altered mental status. History obtained through patient who is able to give history.  Patient reports that his blood glucose machine has been messing up for the past 2-3 days. Therefore he would take his insulin as needed. Last night he was trying to fix his bathroom and laying down tiles.  He report taking 10 unite of novolog at approximately 9pm.  Report he at a grilled sandwich for dinner which is not his usual consumption.  This afternoon his daughter called him, unable to get in touch, and she came over the house to check up on him.  She found pt laying on his couch unable to aroused him.  EMS was contacted, he was found to have a CBG of 17 and HR of 40.  Pt received glucagon IM.  Unable to established IV access.  He became lucid shortly after receiving glucagon.  Once arrived to the hospital his recheck CBG is 140.  At this time patient states he feels at baseline. He denies any recent fever, chills, headache, vision changes, URI symptoms, chest pain, shortness of breath, productive cough, back pain, abdominal pain, dysuria, numbness or rash. Patient has chronic pain and followup at a pain clinic. He denies any increasing use of his narcotic pain medication. Denies any recent alcohol use.   Past Medical History  Diagnosis Date  . Diabetes mellitus without complication   . Hypertension   . Hernia   . Back pain   . Seizures    Past Surgical History  Procedure Laterality Date  . No past surgeries    . Hip arthroplasty Right 02/05/2013    Procedure: ARTHROPLASTY BIPOLAR  HIP;  Surgeon: [name], MD;  Location: [facility];  Service: Orthopedics;  Laterality: Right;   History reviewed. No pertinent family history. History  Substance Use Topics  . Smoking status: Former Smoker -- 0.25 packs/day for 30 years    Types: Cigarettes    Quit date: 01/31/2013  . Smokeless tobacco: Never Used  . Alcohol Use: Yes    Review of Systems  All other systems reviewed and are negative.     Allergies  Review of patient's allergies indicates no known allergies.  Home Medications   Prior to Admission medications   Medication Sig Start Date End Date Taking? Authorizing Provider  alprazolam [name]) 2 MG tablet Take 2 mg by mouth 2 (two) times daily as needed for anxiety.  06/10/13   Historical Provider, MD  FLUoxetine (PROZAC) 20 MG capsule Take 20 mg by mouth daily.    Historical Provider, MD  insulin aspart protamine- aspart (NOVOLOG MIX 70/30) (70-30) 100 UNIT/ML injection Inject 0.1 mLs (10 Units total) into the skin 2 (two) times daily. 03/22/14   Venetia Maxon Rama, MD  insulin glargine (LANTUS) 100 UNIT/ML injection Inject 0.15 mLs (15 Units total) into the skin at bedtime. 03/22/14   Christina P Rama, MD  oxycodone (ROXICODONE) 30 MG immediate release tablet Take 30 mg by mouth every 4 (four) hours as needed for pain.     Historical Provider, MD  ramipril (ALTACE) 10 MG capsule Take 10 mg by  mouth every morning.    Historical Provider, MD   BP 213/87  Pulse 52  Temp(Src) 97.5 F (36.4 C) (Oral)  Resp 16  Ht 5\' 9"  (1.753 m)  Wt 147 lb (66.679 kg)  BMI 21.70 kg/m2  SpO2 100% Physical Exam  Constitutional: He is oriented to person, place, and time. He appears well-developed and well-nourished. No distress.  HENT:  Head: Atraumatic.  Mouth/Throat: Oropharynx is clear and moist.  Eyes: Conjunctivae and EOM are normal. Pupils are equal, round, and reactive to light.  Neck: Normal range of motion. Neck supple.  No nuchal rigidity  Cardiovascular: Normal  rate, regular rhythm and intact distal pulses.   Pulmonary/Chest: Effort normal and breath sounds normal.  Abdominal: Soft. There is no tenderness.  Neurological: He is alert and oriented to person, place, and time.  Neurologic exam:  Speech clear, pupils equal round reactive to light, extraocular movements intact  Normal peripheral visual fields Cranial nerves III through XII normal including no facial droop Follows commands, moves all extremities x4, normal strength to bilateral upper and lower extremities at all major muscle groups including grip Sensation normal to light touch  Coordination intact, no limb ataxia, finger-nose-finger normal No pronator drift Gait not tested   Skin: No rash noted.  Psychiatric: He has a normal mood and affect.    ED Course  Procedures (including critical care time)  3:21 PM Patient was found unresponsive at home with a initial blood sugar of 17. His blood sugar has improved to 146 after receiving IM glucagon he is currently mentating at baseline. He has no others complaints to account for his altered mental status changes aside from hypoglycemia likely from inappropriate use of his insulin due to malfunction of his CBG machine.  He is currently hypertensive with BP 213/87.  Did not take his morning BP medication. Will give a dose of ramipril.    Evidence of renal insufficiency with Cr. 1.9.  IVF given.  Care discussed with Dr. Dina Rich.   4:29 PM Pt is fully alert, conversational.  Vital sign stable.  BP improves with ramipril.  UA without evidence of UTI.  BCG improves.  Evidence of renal insufficiency, pt recommend to f/u closely with PCP for recheck of his renal function.  Normal orthostatic vital sign.  Pt told me that his daughter has fixed his CBG machine at home and it should be good working order.  At this time pt is stable for discharge.  Recommend checking CBG before taking insulin.  Return precaution discussed.   Labs Review Labs Reviewed   CBC WITH DIFFERENTIAL - Abnormal; Notable for the following:    RBC 4.09 (*)    Hemoglobin 12.4 (*)    HCT 36.8 (*)    All other components within normal limits  URINALYSIS, ROUTINE W REFLEX MICROSCOPIC - Abnormal; Notable for the following:    Color, Urine AMBER (*)    Glucose, UA 250 (*)    Ketones, ur 15 (*)    Protein, ur 100 (*)    All other components within normal limits  URINE MICROSCOPIC-ADD ON - Abnormal; Notable for the following:    Squamous Epithelial / LPF FEW (*)    All other components within normal limits  CBG MONITORING, ED - Abnormal; Notable for the following:    Glucose-Capillary 140 (*)    All other components within normal limits  I-STAT CHEM 8, ED - Abnormal; Notable for the following:    Potassium 3.5 (*)    Creatinine,  Ser 1.90 (*)    Glucose, Bld 181 (*)    Calcium, Ion 1.08 (*)    All other components within normal limits  I-STAT TROPOININ, ED    Imaging Review No results found.   EKG Interpretation None      Date: 06/28/2014  Rate: 60  Rhythm: normal sinus rhythm  QRS Axis: left  Intervals: normal  ST/T Wave abnormalities: borderline T wave abnormalities in inferior leads  Conduction Disutrbances:none  Narrative Interpretation:   Old EKG Reviewed: unchanged    MDM   Final diagnoses:  Hypoglycemia due to insulin  Essential hypertension    BP 164/67  Pulse 58  Temp(Src) 97.5 F (36.4 C) (Oral)  Resp 18  Ht 5\' 9"  (1.753 m)  Wt 147 lb (66.679 kg)  BMI 21.70 kg/m2  SpO2 99%  I have reviewed nursing notes and vital signs. I personally reviewed the imaging tests through PACS system  I reviewed available ER/hospitalization records thought the EMR     Domenic Moras, [place] 06/28/14 1637

## 2014-07-01 NOTE — ED Provider Notes (Signed)
Medical screening examination/treatment/procedure(s) were conducted as a shared visit with non-physician practitioner(s) and myself.  I personally evaluated the patient during the encounter.   EKG Interpretation   Date/Time:  Wednesday June 28 2014 14:36:55 EDT Ventricular Rate:  60 PR Interval:  197 QRS Duration: 104 QT Interval:  450 QTC Calculation: 450 R Axis:   -35 Text Interpretation:  Sinus rhythm Left axis deviation Borderline T  abnormalities, inferior leads Similar to prior Confirmed by HORTON  MD,  Loma Sousa ([id number]) on 06/28/2014 4:34:38 PM      Patient presents with AMS.  FOund to be hypoglycemia.  HIstory of both hyper and hypoglycemia required ER visits.  Patient states that his glucometer is broken and he may have taken too much insulin.  Found with BG of 17 and given glucogan.  Currently AAOx3.  Non focal.  Further w/u reassuring.  BG stable in ER and patient tolerating PO.  Patient reports daughter fixed his glucometer at home.  Patient re-educated regarding proper insulin administration.   After history, exam, and medical workup I feel the patient has been appropriately medically screened and is safe for discharge home. Pertinent diagnoses were discussed with the patient. Patient was given return precautions.   Merryl Hacker, MD 07/01/14 [phone]

## 2014-07-07 IMAGING — CT CT HEAD W/O CM
1 of 2 series · 16 of 30 positions shown, 20 images · non-contrast
Comparison: 11/02/2008

CLINICAL DATA: Decreased level of consciousness

CT HEAD WITHOUT CONTRAST
TECHNIQUE: Contiguous axial images were obtained from the base of
the skull through the vertex without contrast.

[Series 2: head 5.0 h30s · axial · 0.41mm/px · z∈[-133,-3]mm · 16 of 30 slices shown, 20 images]
[im 2/30  brain]
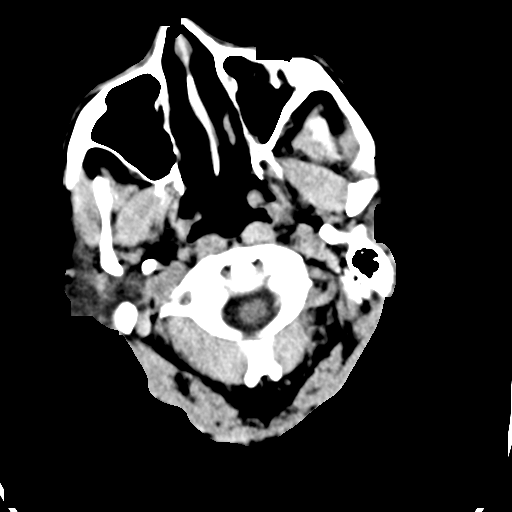
[im 2/30  bone]
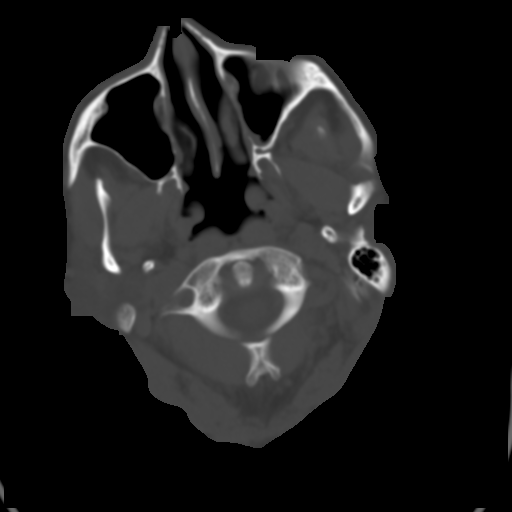
[im 3/30  brain]
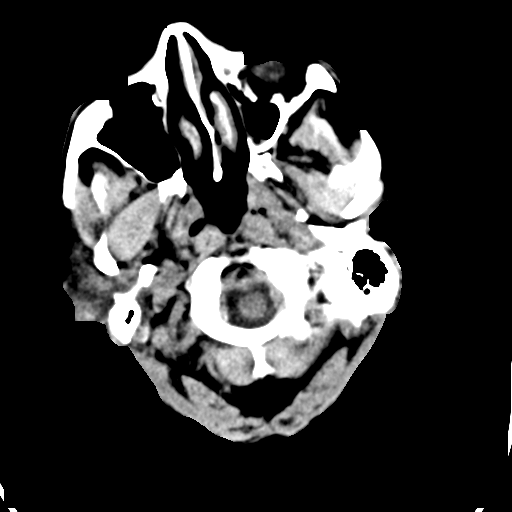
[im 6/30  brain]
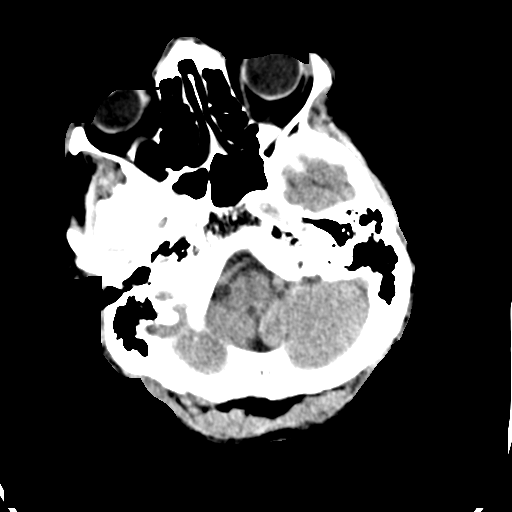
[im 7/30  brain]
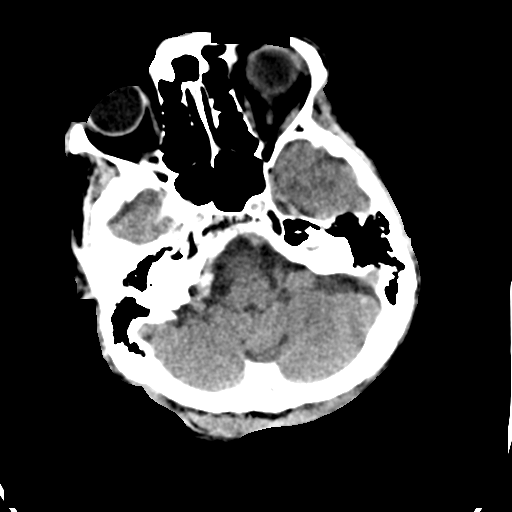
[im 9/30  brain]
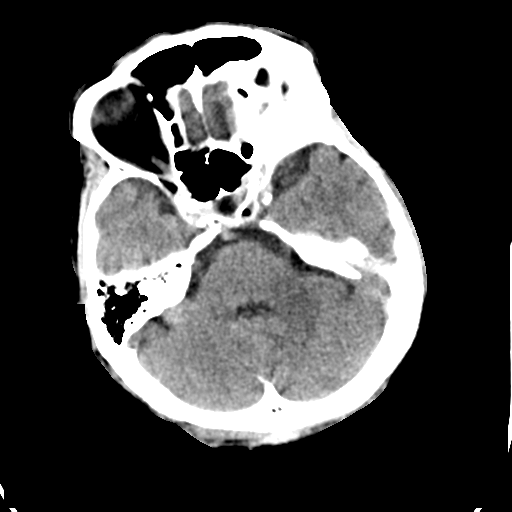
[im 9/30  bone]
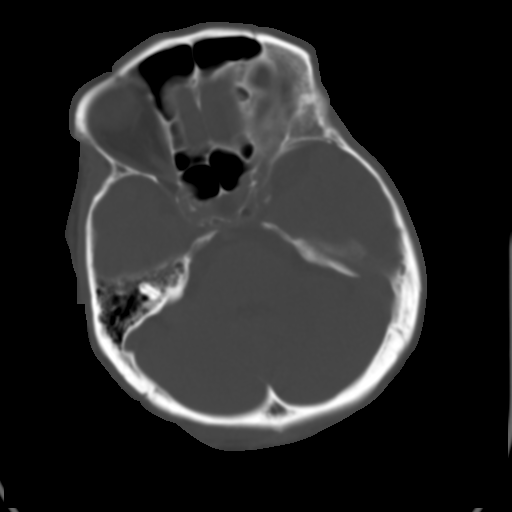
[im 10/30  brain]
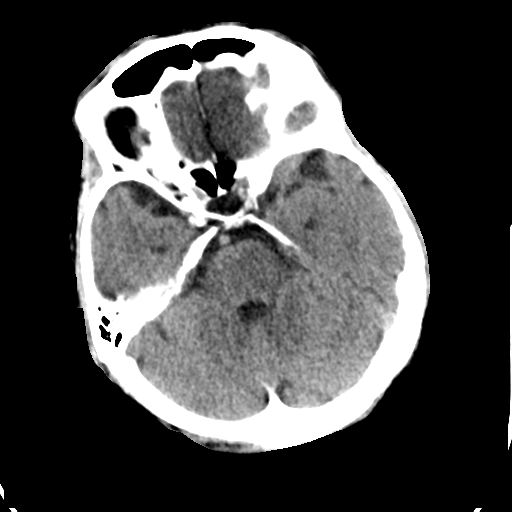
[im 13/30  brain]
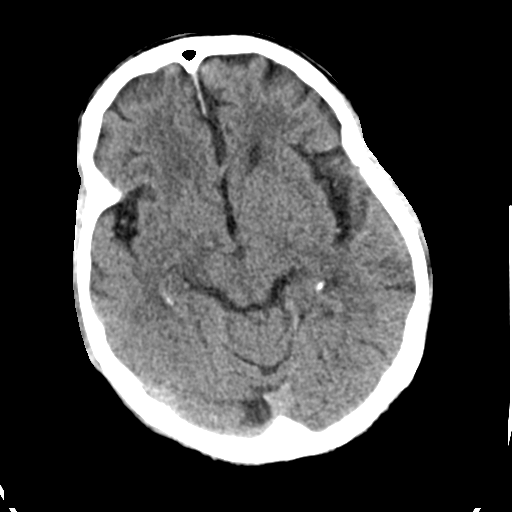
[im 14/30  brain]
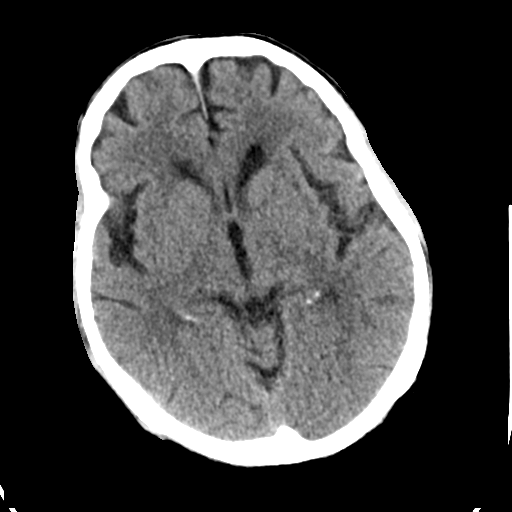
[im 16/30  brain]
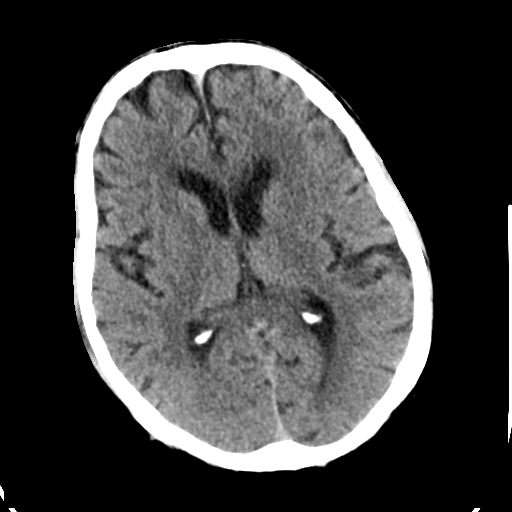
[im 16/30  bone]
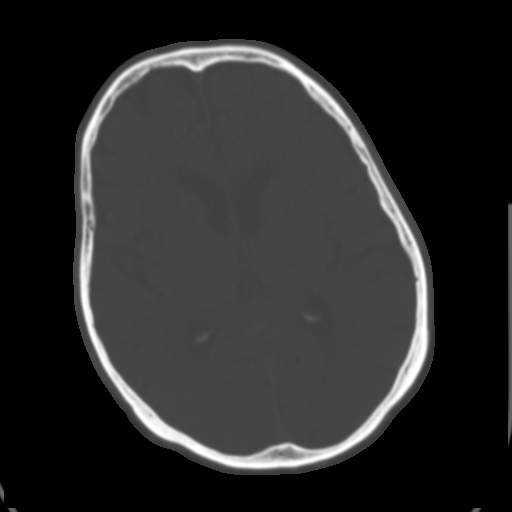
[im 17/30  brain]
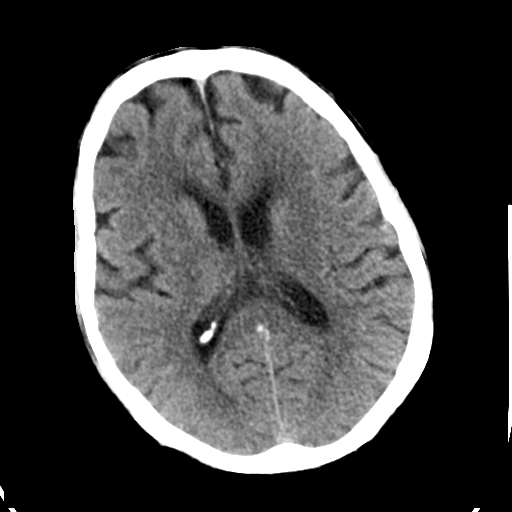
[im 20/30  brain]
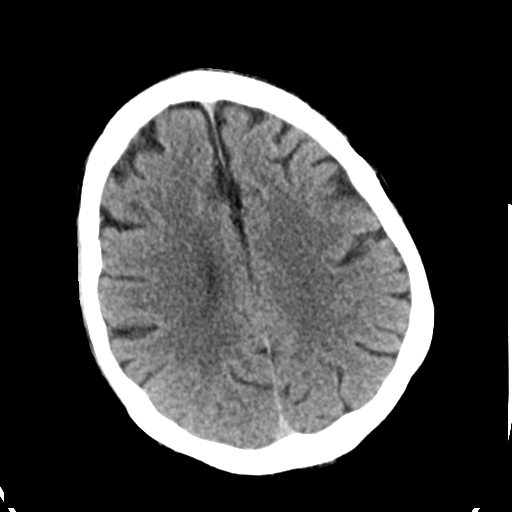
[im 21/30  brain]
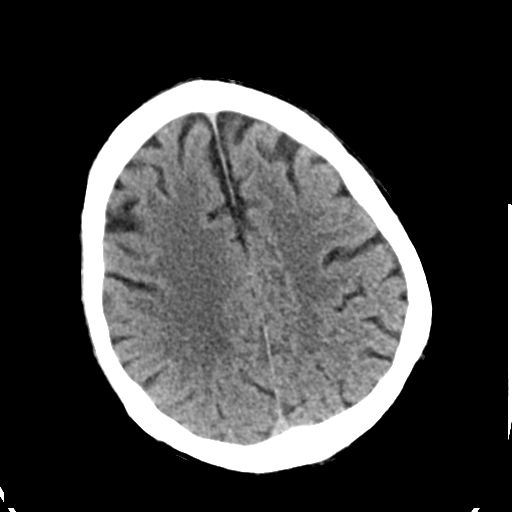
[im 23/30  brain]
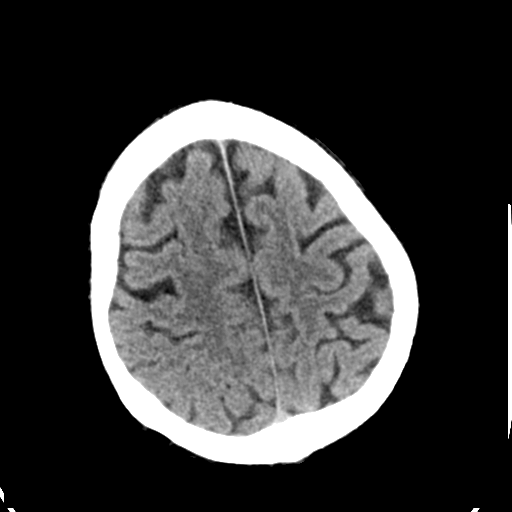
[im 23/30  bone]
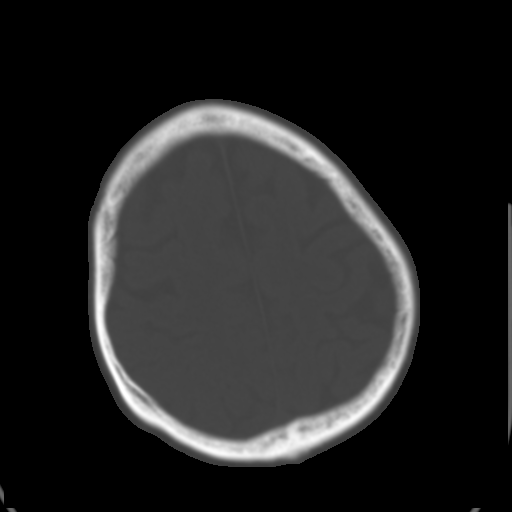
[im 24/30  brain]
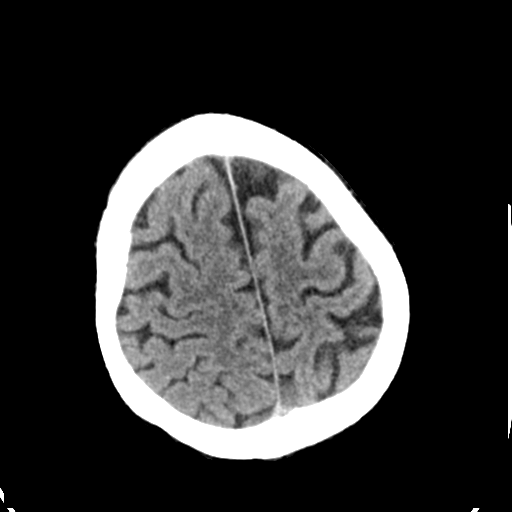
[im 27/30  brain]
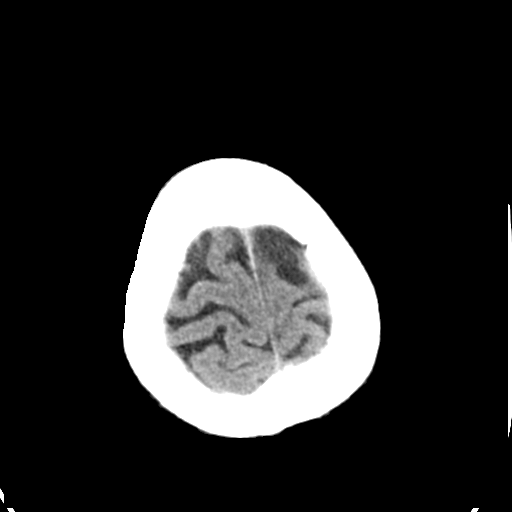
[im 28/30  brain]
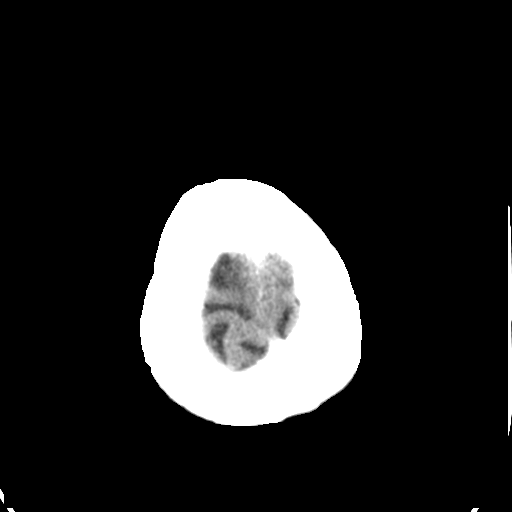

[16 of 30 positions shown; findings below may reference images not displayed]

FINDINGS: Calvarium is intact.  There is mild age related atrophy.
No abnormal attenuation to suggest hemorrhage, infarct, or mass.
No hydrocephalus.  Minimal deep white matter low attenuation.
Stable mild inflammatory change in the left maxillary sinus.
IMPRESSION: Mild stable age related involutional change.  No acute findings.

## 2014-07-13 ENCOUNTER — Encounter (INDEPENDENT_AMBULATORY_CARE_PROVIDER_SITE_OTHER): Payer: Medicare Other | Admitting: Ophthalmology

## 2014-08-05 IMAGING — CR DG CHEST 1V PORT
1 series · 1 of 1 positions shown · non-contrast
Comparison: 02/07/2013

CLINICAL DATA: hyperglycemia.  Hypertension.

PORTABLE CHEST - 1 VIEW

[AP]
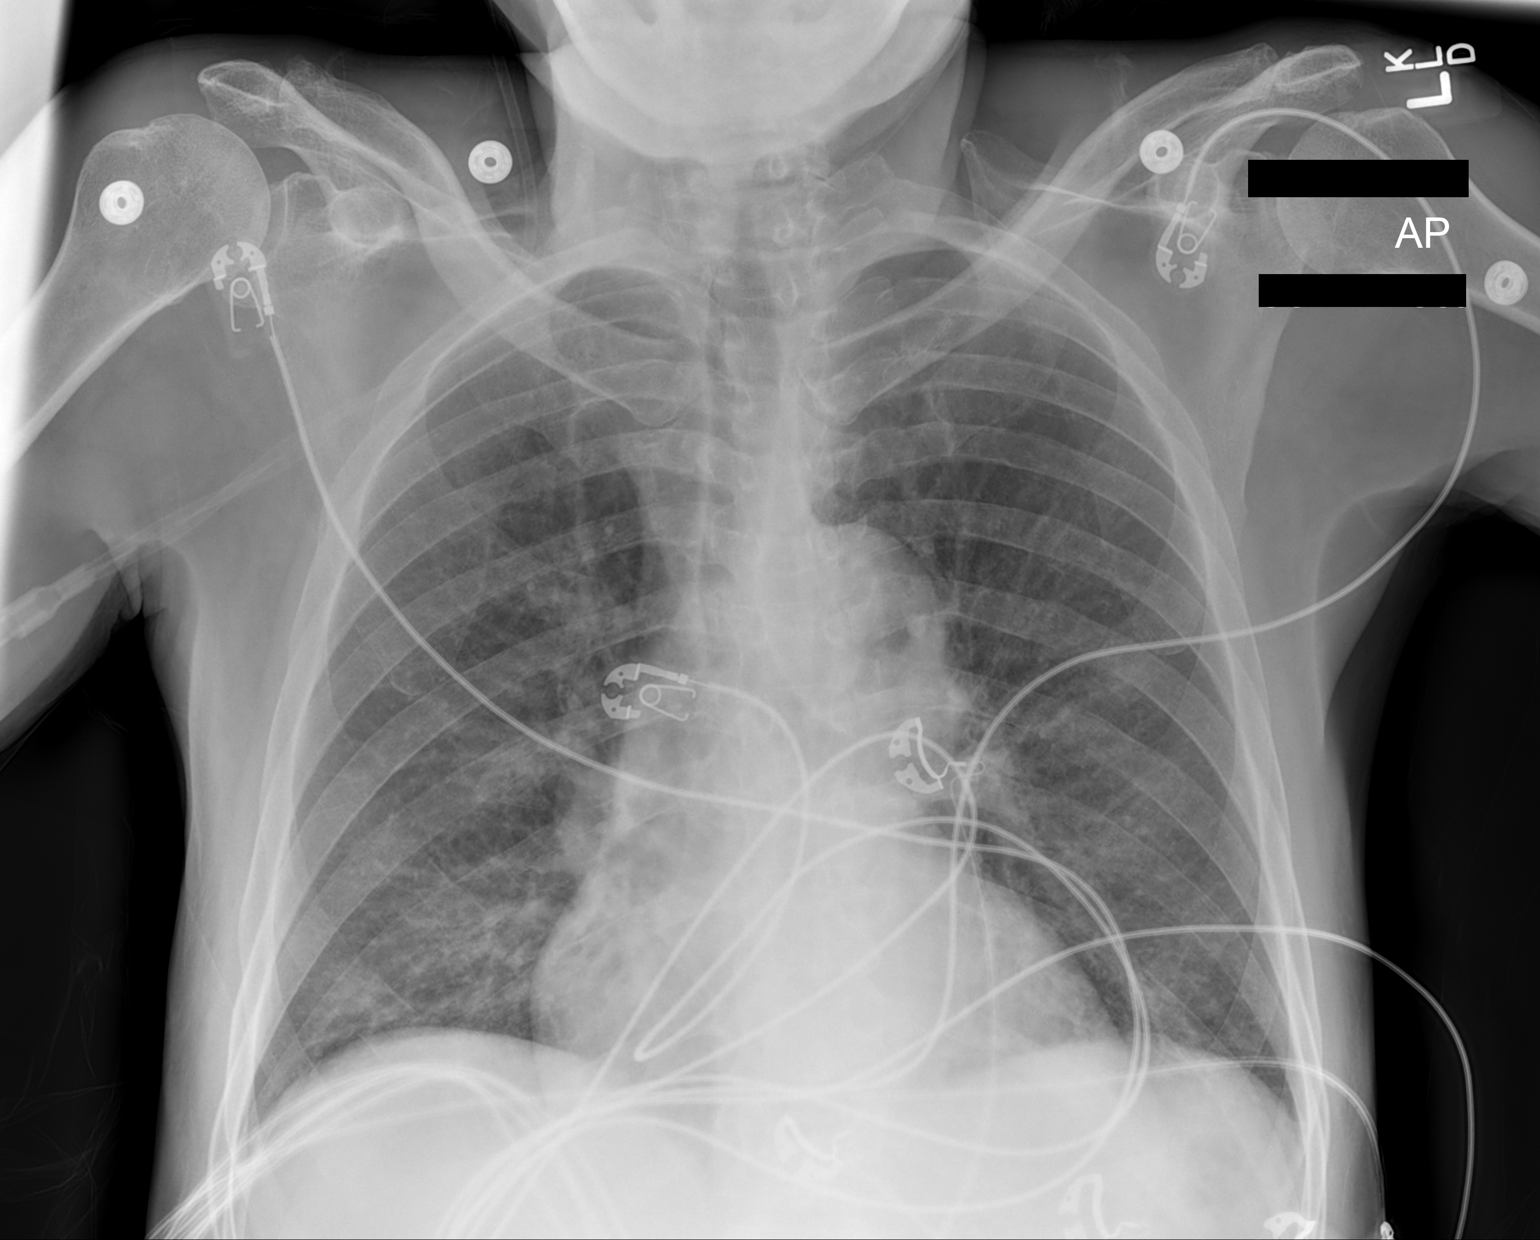

[1 of 1 positions shown; findings below may reference images not displayed]

FINDINGS: The shallow inspiration.  Mild cardiac enlargement and
pulmonary vascular prominence is probably normal for technique.
There is increased density in the lung bases suggesting early
infiltration or atelectasis.  No blunting of costophrenic angles.
No pneumothorax.  Mediastinal contours appear intact.  Degenerative
changes in the shoulders.
IMPRESSION: Shallow inspiration likely accounts for prominence of the heart
size and pulmonary vessels.  Suggestion of early infiltration or
atelectasis in the lung bases.

## 2014-08-06 IMAGING — CR DG CHEST 2V
2 series · 2 of 2 positions shown · non-contrast
Comparison: 05/29/2013

CLINICAL DATA: atelectasis

CHEST - 2 VIEW

[w chest pa]
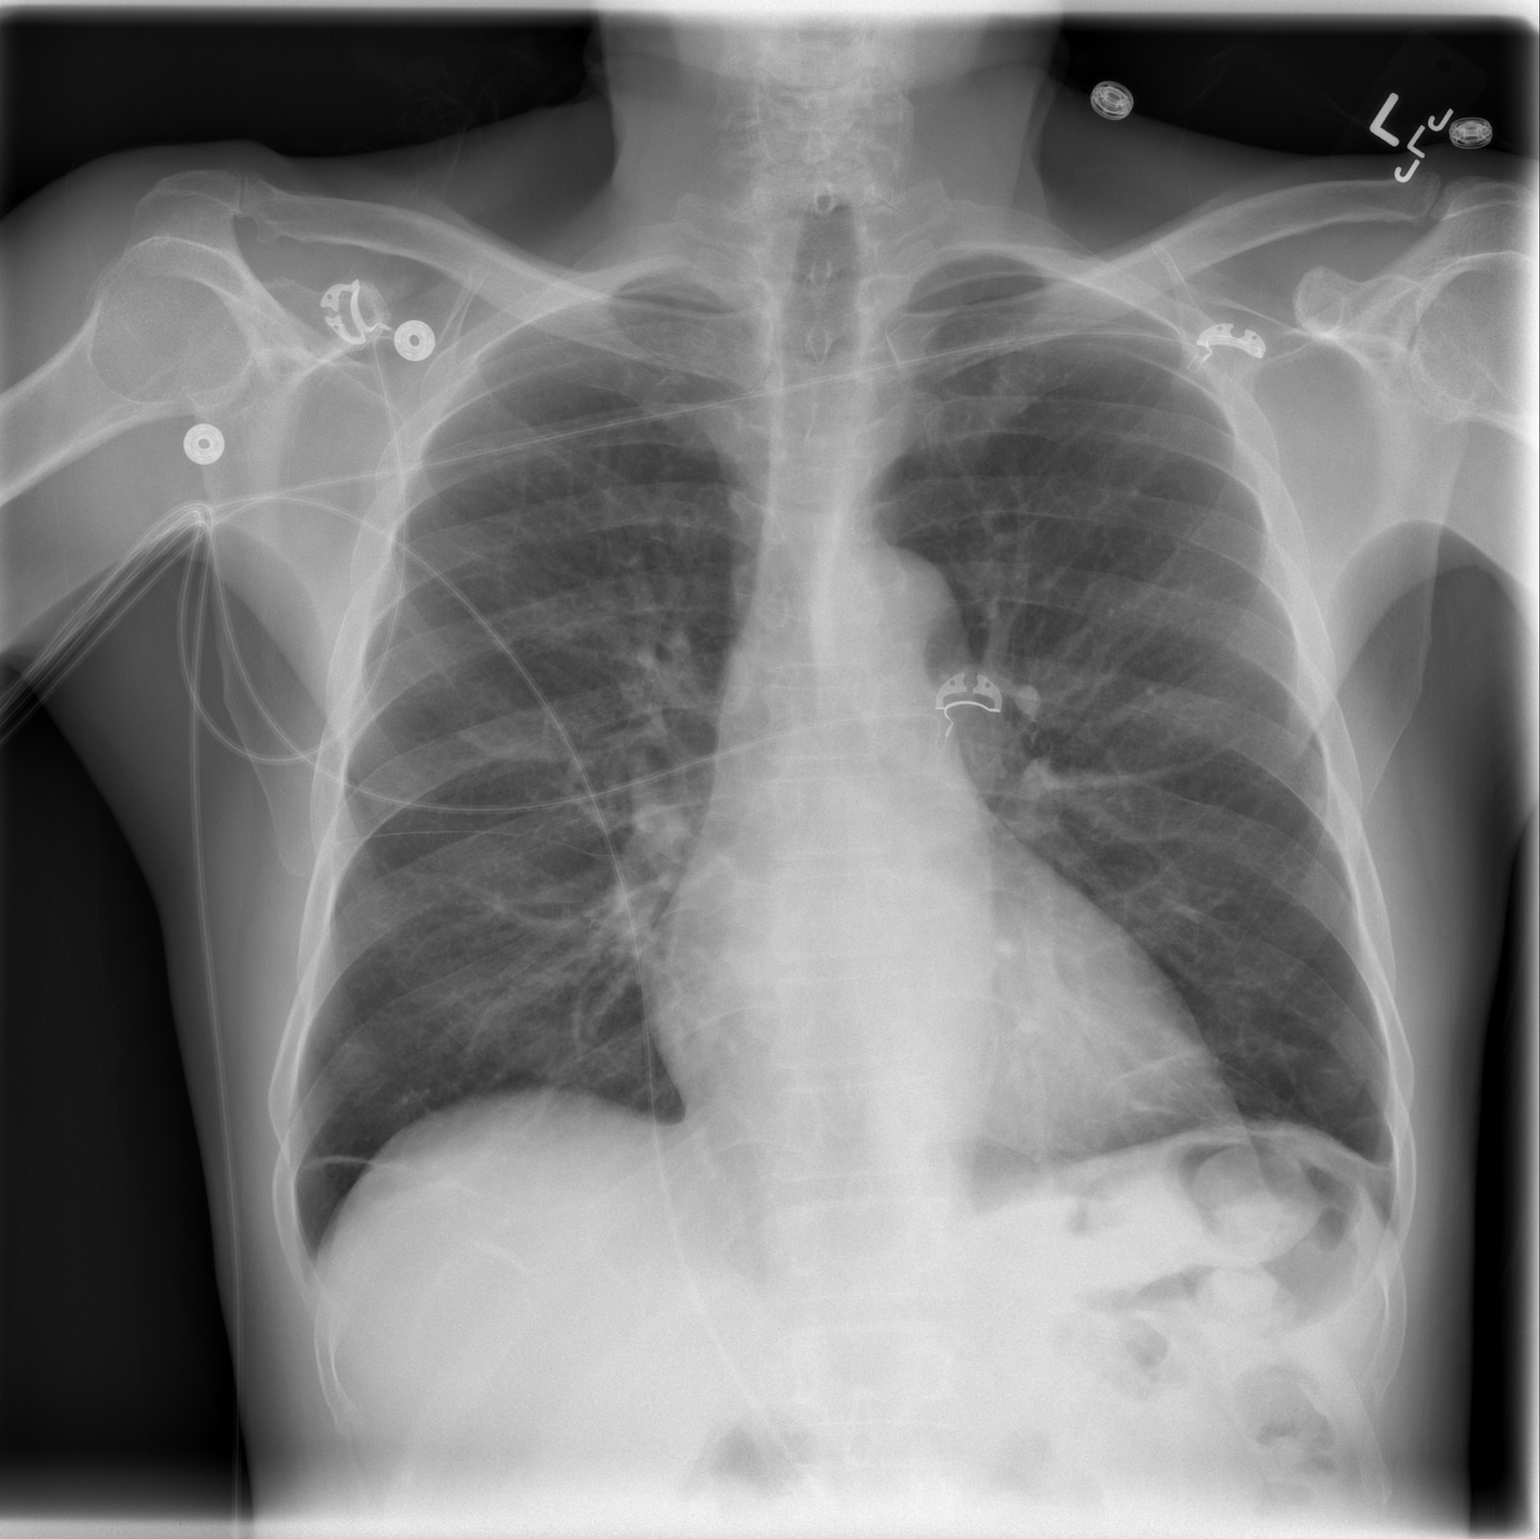

[w chest lat]
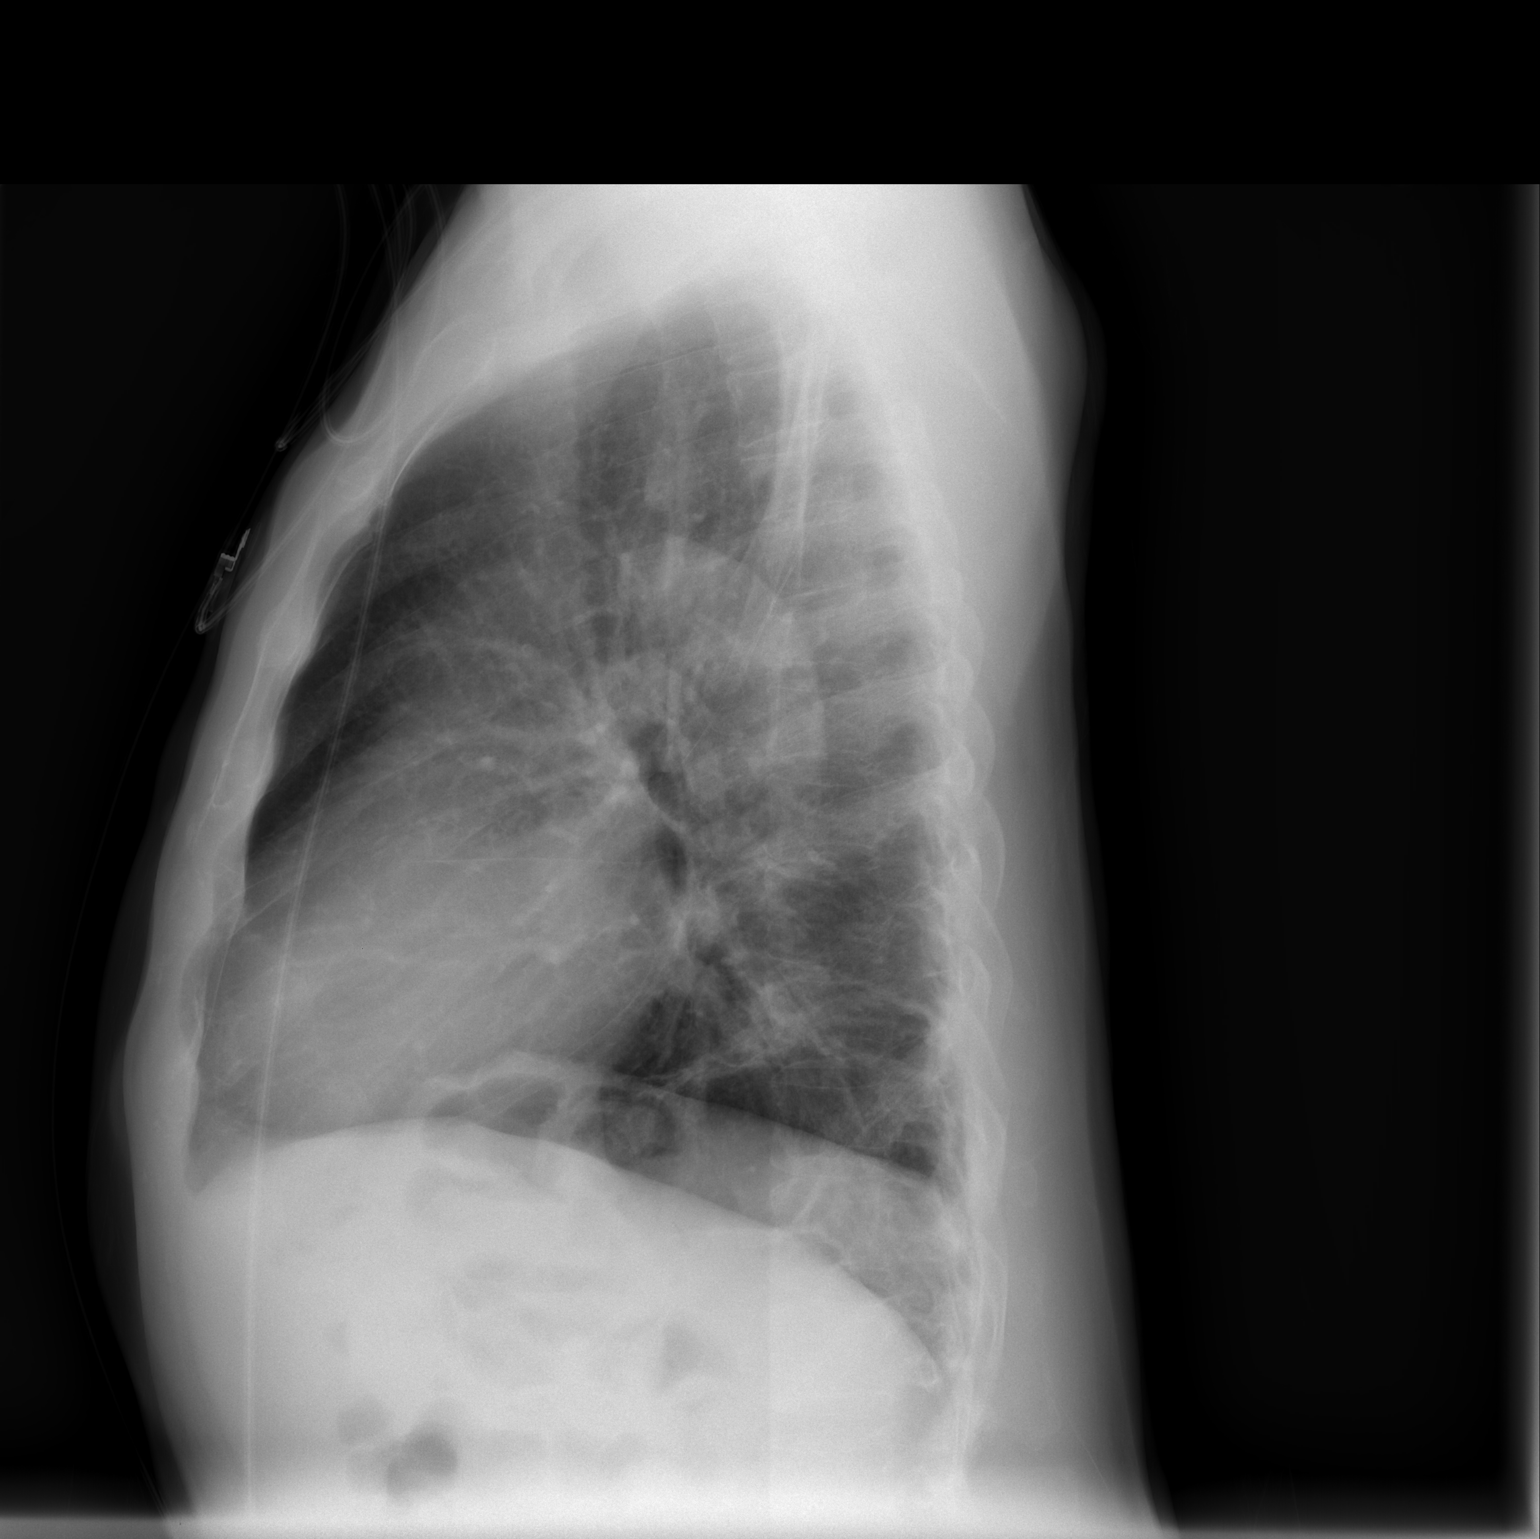

[2 of 2 positions shown; findings below may reference images not displayed]

FINDINGS: The heart size appears normal.  Linear opacity in the
lung bases may represent scarring or atelectasis.  No airspace
consolidation identified.  There are no pleural effusions
identified. Within the mid to upper thoracic spine there is a mild
compression deformity.  This is unchanged from 02/02/2013.
IMPRESSION: 1.  Scar versus atelectasis in the lung bases.
2.  Chronic mid to upper thoracic spine compression deformity.

## 2014-08-11 ENCOUNTER — Encounter (INDEPENDENT_AMBULATORY_CARE_PROVIDER_SITE_OTHER): Payer: Medicare Other | Admitting: Ophthalmology

## 2014-08-11 DIAGNOSIS — E11319 Type 2 diabetes mellitus with unspecified diabetic retinopathy without macular edema: Secondary | ICD-10-CM | POA: Diagnosis not present

## 2014-08-11 DIAGNOSIS — E11329 Type 2 diabetes mellitus with mild nonproliferative diabetic retinopathy without macular edema: Secondary | ICD-10-CM | POA: Diagnosis not present

## 2014-08-11 DIAGNOSIS — H35033 Hypertensive retinopathy, bilateral: Secondary | ICD-10-CM | POA: Diagnosis not present

## 2014-08-11 DIAGNOSIS — H34831 Tributary (branch) retinal vein occlusion, right eye: Secondary | ICD-10-CM

## 2014-08-11 DIAGNOSIS — H43813 Vitreous degeneration, bilateral: Secondary | ICD-10-CM

## 2014-08-11 DIAGNOSIS — I10 Essential (primary) hypertension: Secondary | ICD-10-CM

## 2014-09-06 ENCOUNTER — Encounter (INDEPENDENT_AMBULATORY_CARE_PROVIDER_SITE_OTHER): Payer: Medicare Other | Admitting: Ophthalmology

## 2014-09-06 DIAGNOSIS — H34831 Tributary (branch) retinal vein occlusion, right eye: Secondary | ICD-10-CM

## 2014-09-06 DIAGNOSIS — I10 Essential (primary) hypertension: Secondary | ICD-10-CM

## 2014-09-06 DIAGNOSIS — E10321 Type 1 diabetes mellitus with mild nonproliferative diabetic retinopathy with macular edema: Secondary | ICD-10-CM

## 2014-09-06 DIAGNOSIS — E10319 Type 1 diabetes mellitus with unspecified diabetic retinopathy without macular edema: Secondary | ICD-10-CM

## 2014-09-06 DIAGNOSIS — H35033 Hypertensive retinopathy, bilateral: Secondary | ICD-10-CM | POA: Diagnosis not present

## 2014-09-06 DIAGNOSIS — E10339 Type 1 diabetes mellitus with moderate nonproliferative diabetic retinopathy without macular edema: Secondary | ICD-10-CM

## 2014-09-09 IMAGING — CR DG CHEST 2V
2 series · 2 of 2 positions shown · non-contrast
Comparison: May 30, 2013

CLINICAL DATA: Syncope and hyperglycemia

EXAM:
CHEST  2 VIEW

[w chest pa]
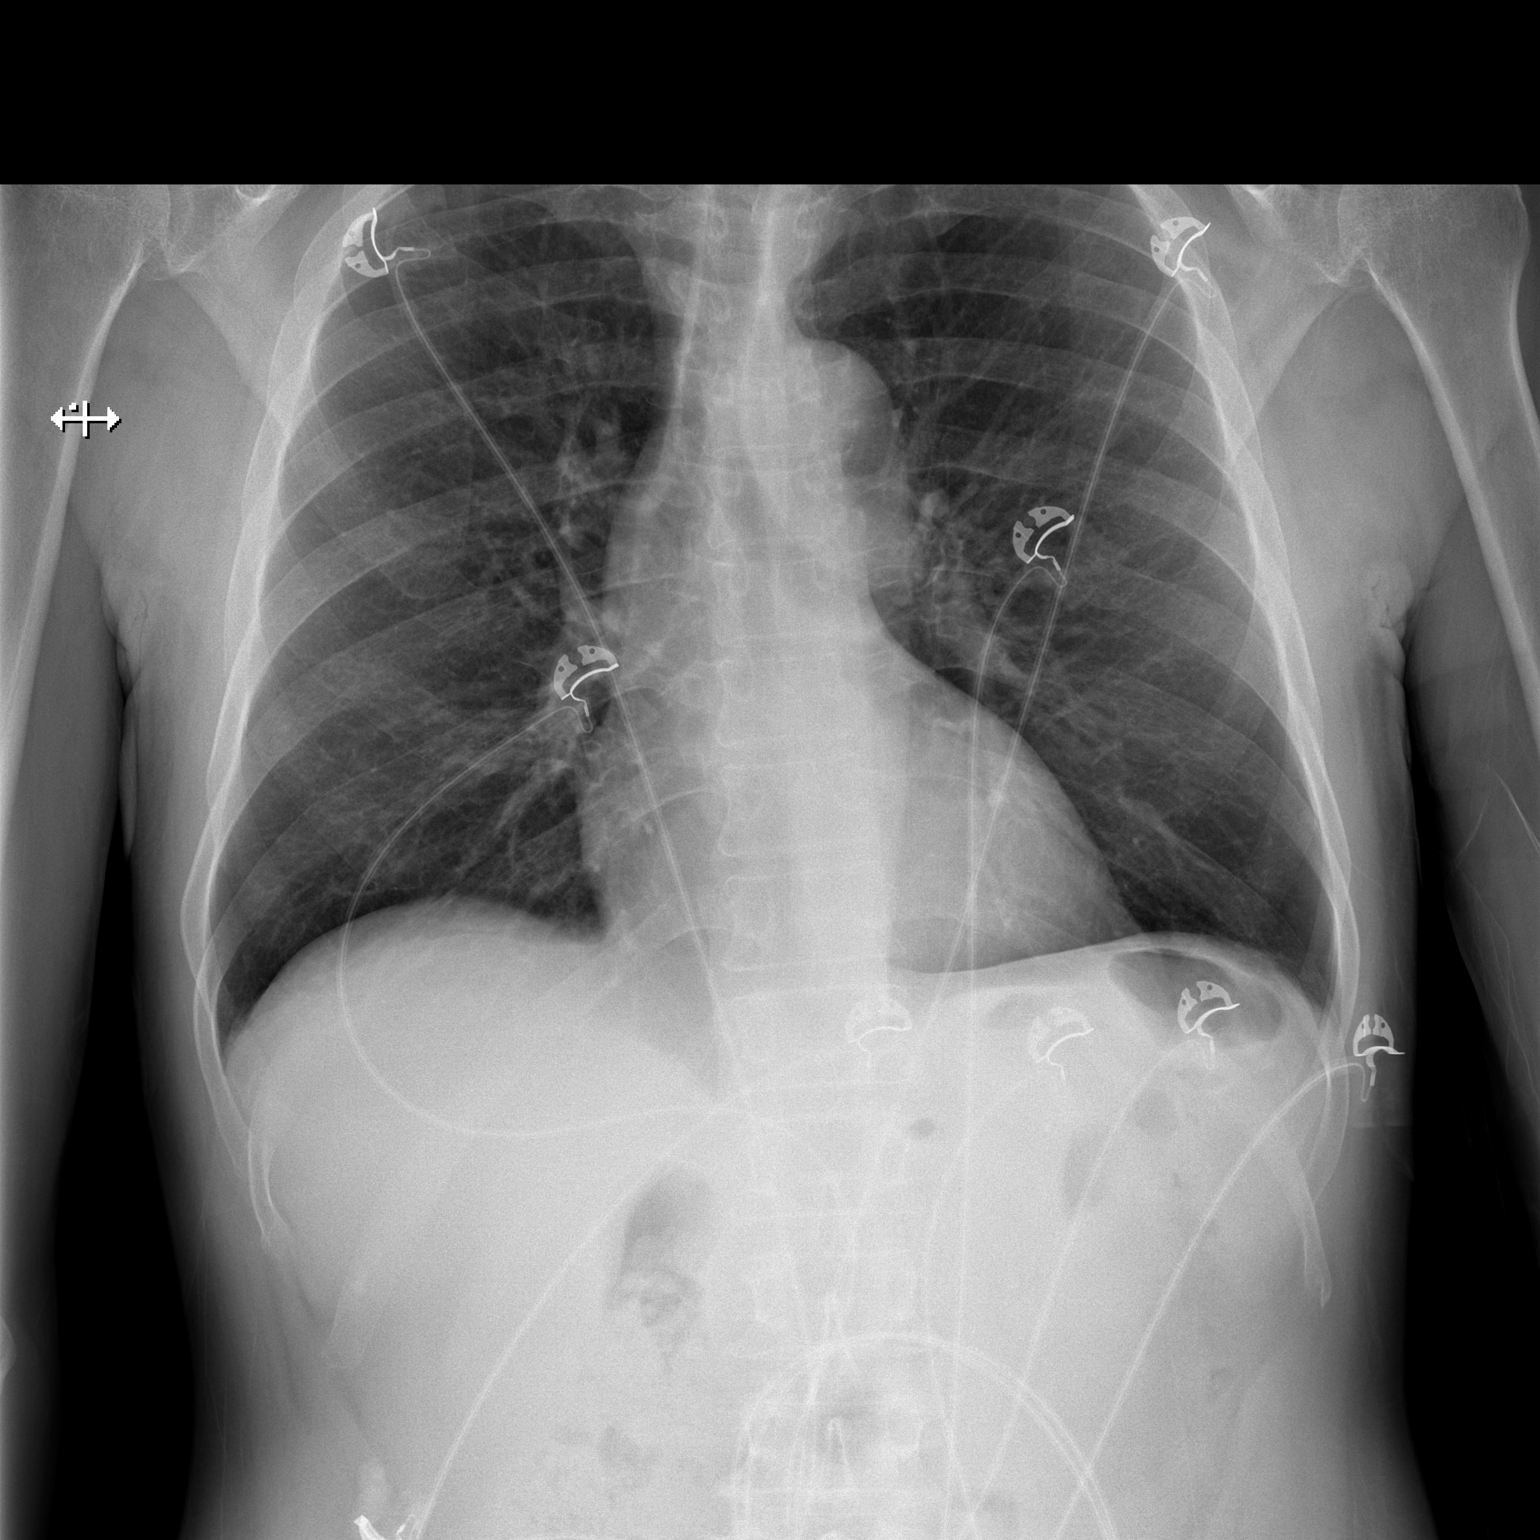

[w chest lat]
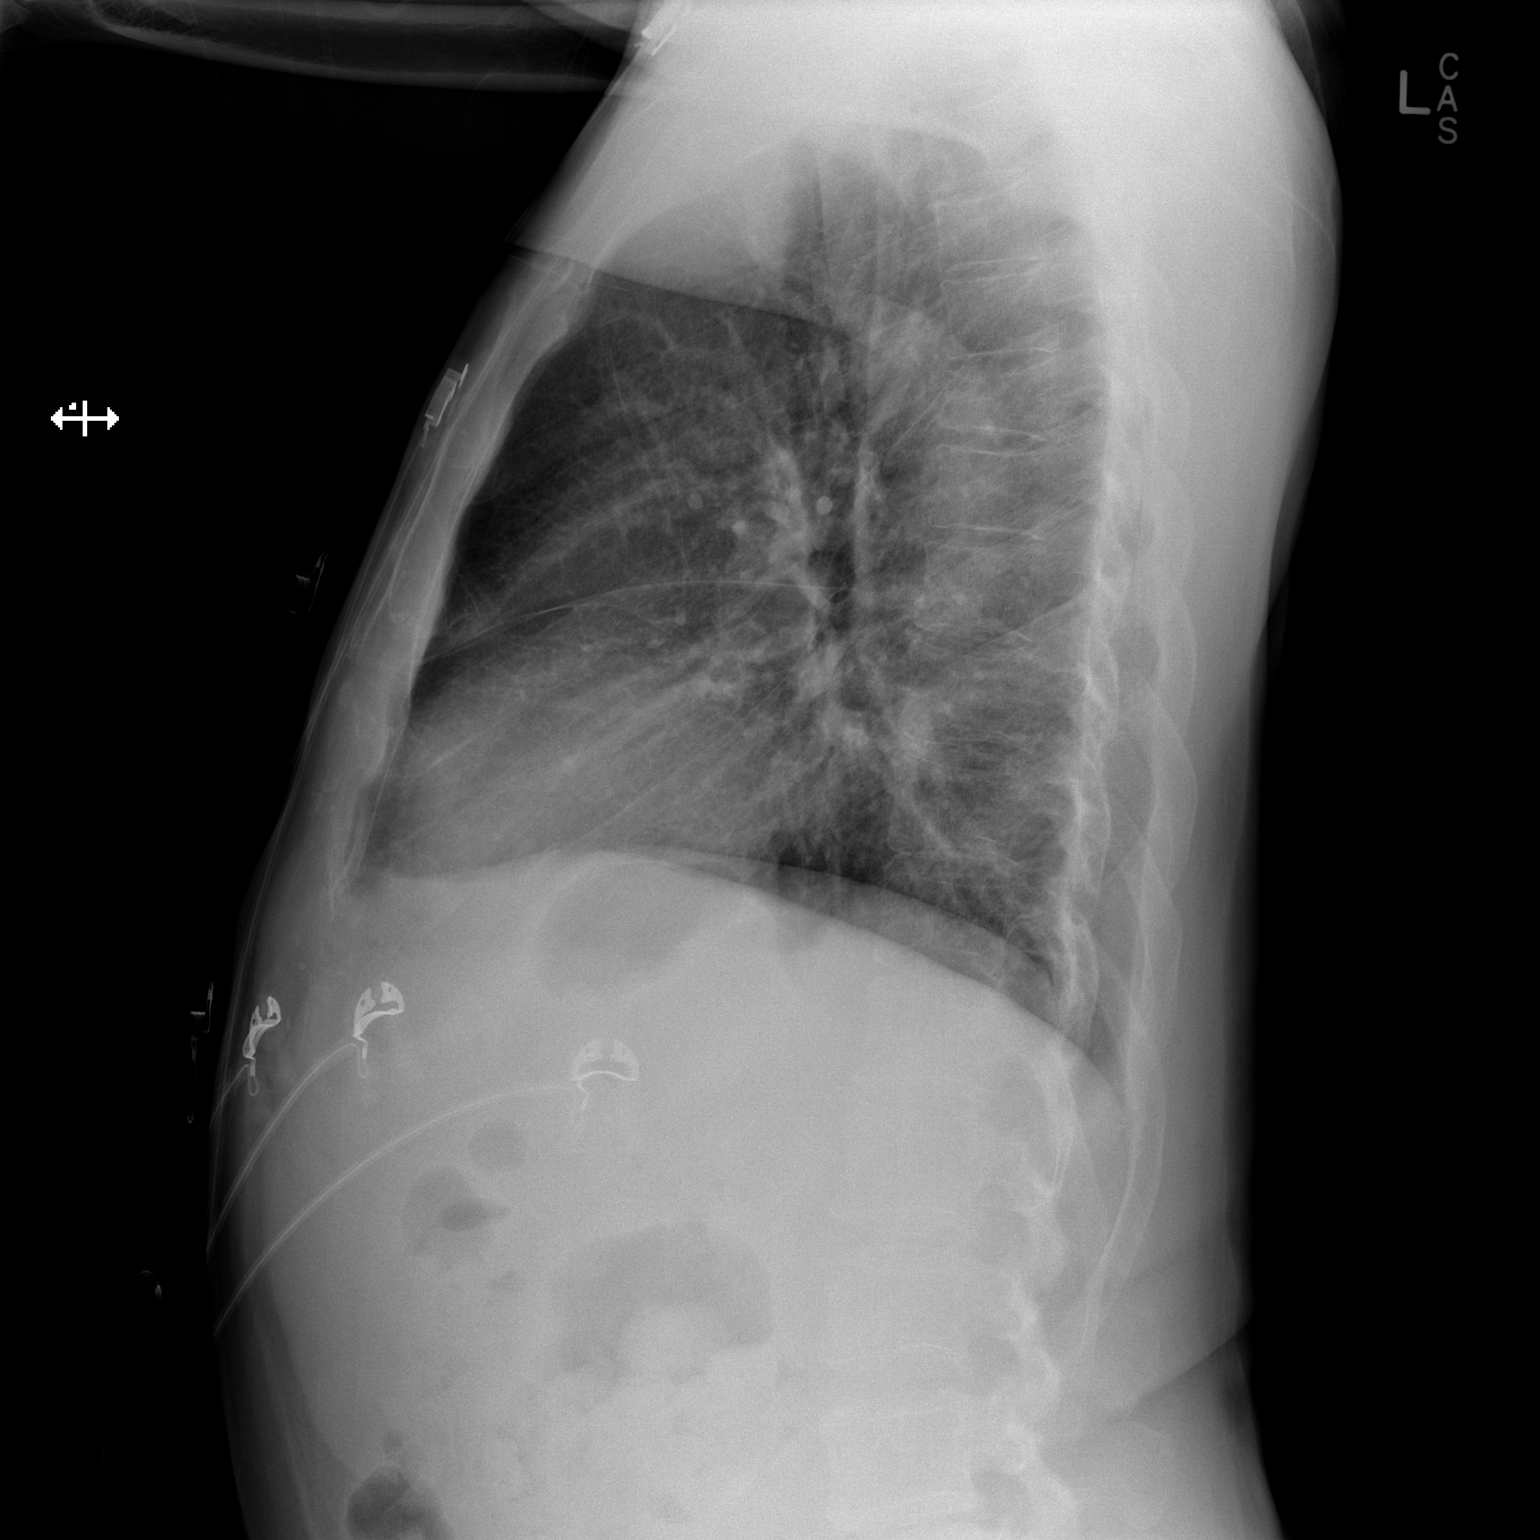

[2 of 2 positions shown; findings below may reference images not displayed]

FINDINGS: The lungs are clear. Heart size and pulmonary vascularity are
normal. No adenopathy. There is a stable anterior wedge compression
fracture in the mid thoracic spine.
IMPRESSION: No edema or consolidation. Stable wedge compression fracture mid
thoracic spine.

## 2014-09-09 IMAGING — CT CT HEAD W/O CM
2 of 4 series · 17 of 30 positions shown, 20 images · non-contrast
Comparison: Head CT [DATE] and 11/02/2008.

CLINICAL DATA: Syncopal episode today. Weakness.

EXAM:
CT HEAD WITHOUT CONTRAST
TECHNIQUE: Contiguous axial images were obtained from the base of the skull
through the vertex without intravenous contrast.

[Series 2: head w/o · axial · non-contrast · 0.40mm/px · z∈[+1529,+1639]mm · 10 of 28 slices shown, 13 images]
[im 3/28  brain]
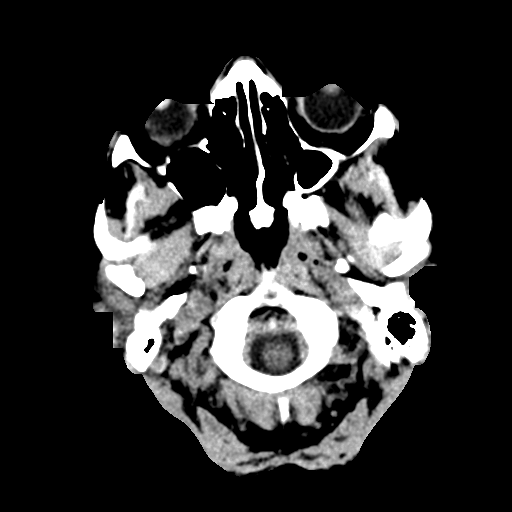
[im 3/28  bone]
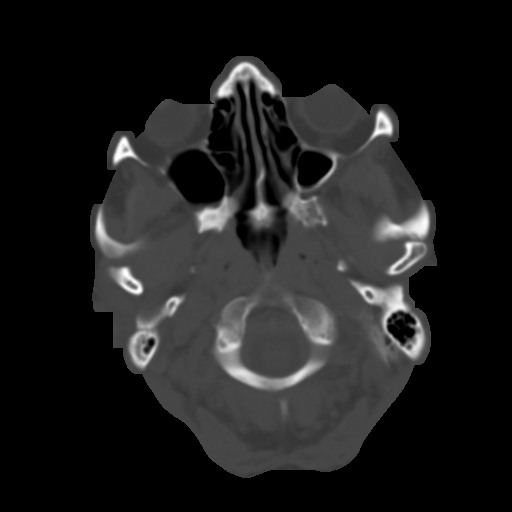
[im 5/28  brain]
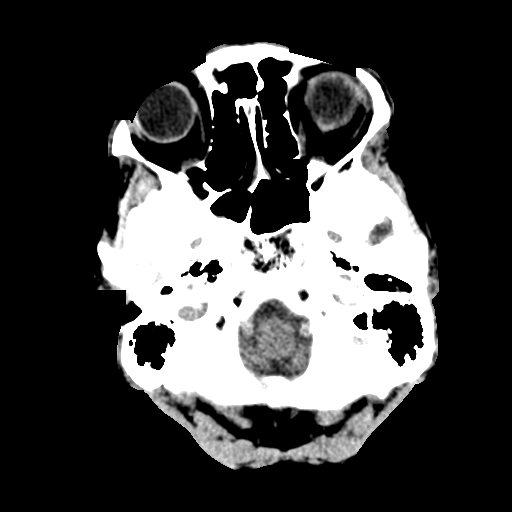
[im 8/28  brain]
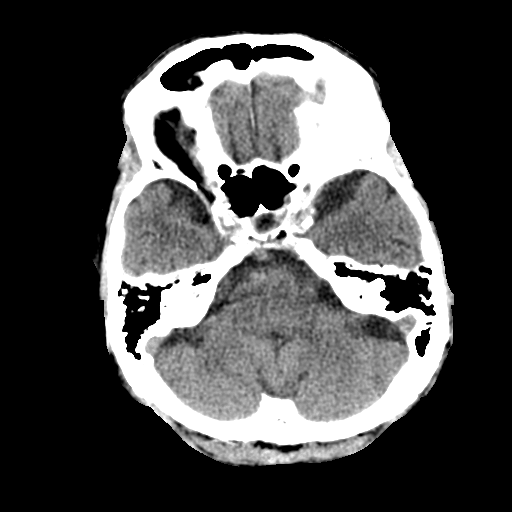
[im 10/28  brain]
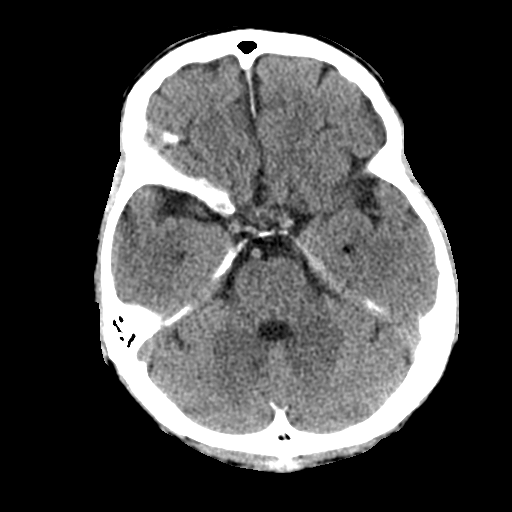
[im 13/28  brain]
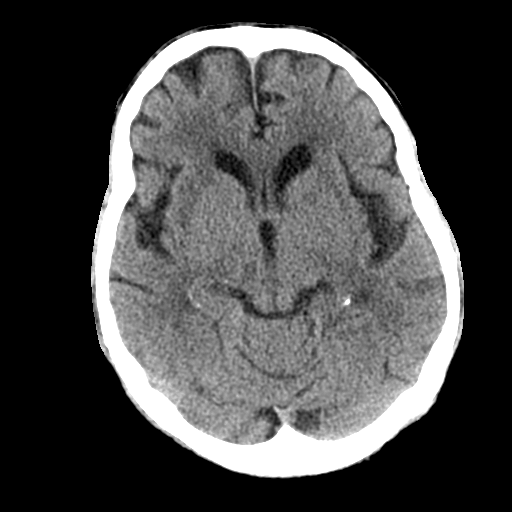
[im 13/28  bone]
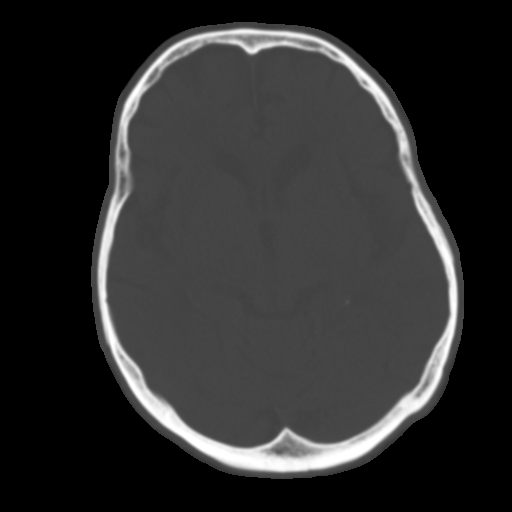
[im 15/28  brain]
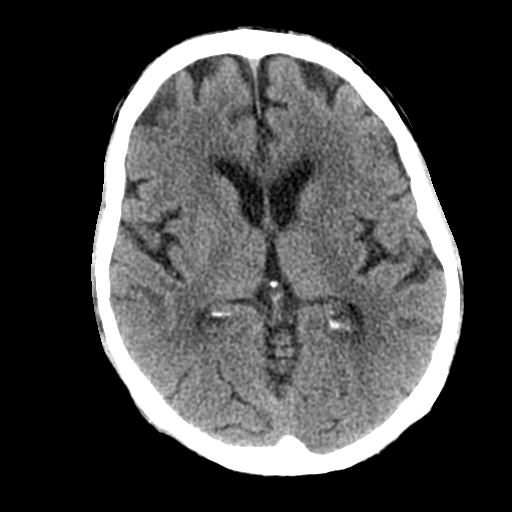
[im 18/28  brain]
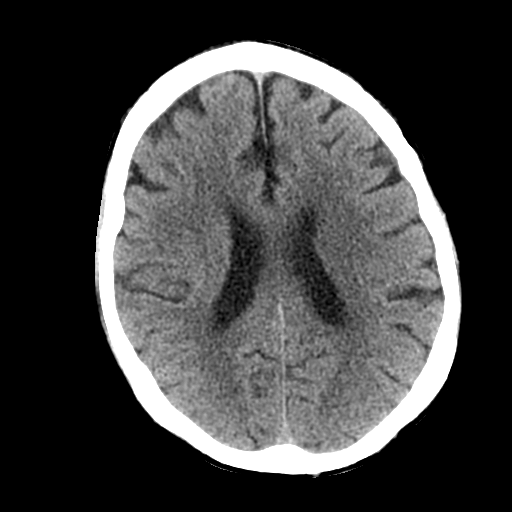
[im 20/28  brain]
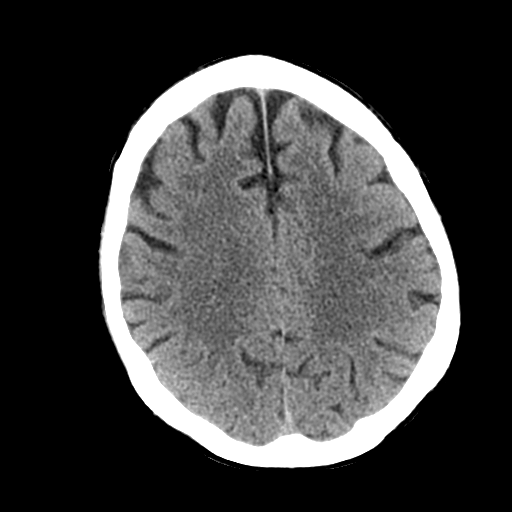
[im 23/28  brain]
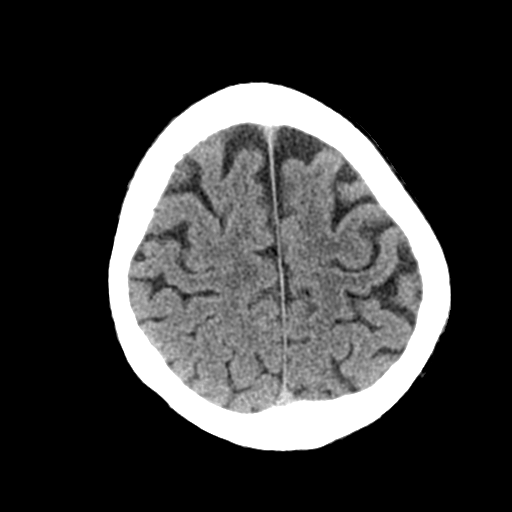
[im 23/28  bone]
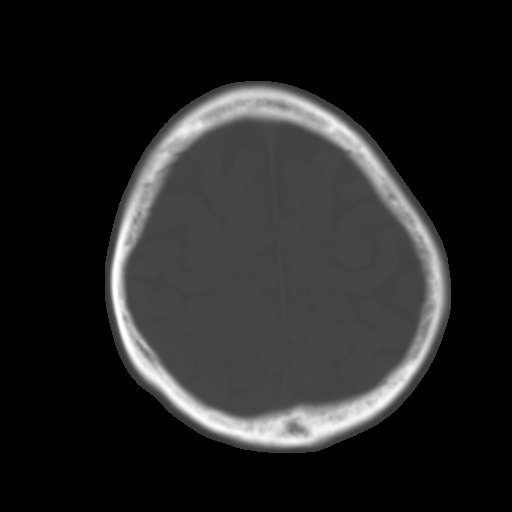
[im 25/28  brain]
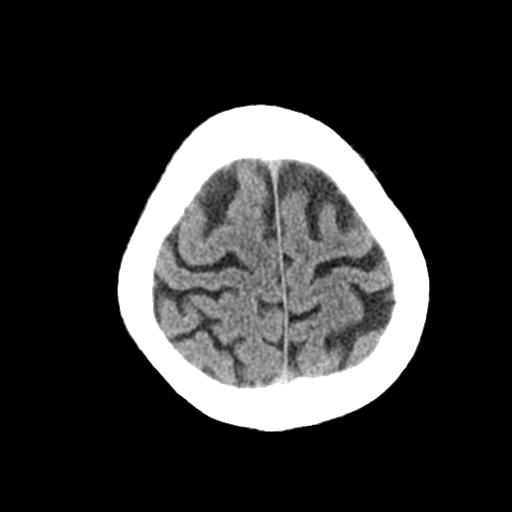

[Series 3: bone windows · axial · 0.40mm/px · z∈[+1529,+1629]mm · 7 of 28 slices shown]
[im 3/28  bone]
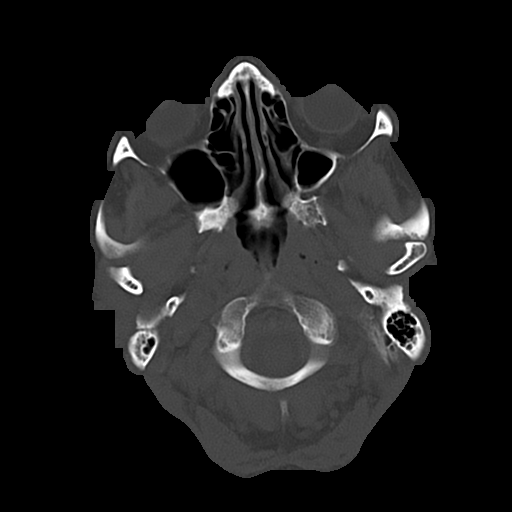
[im 5/28  bone]
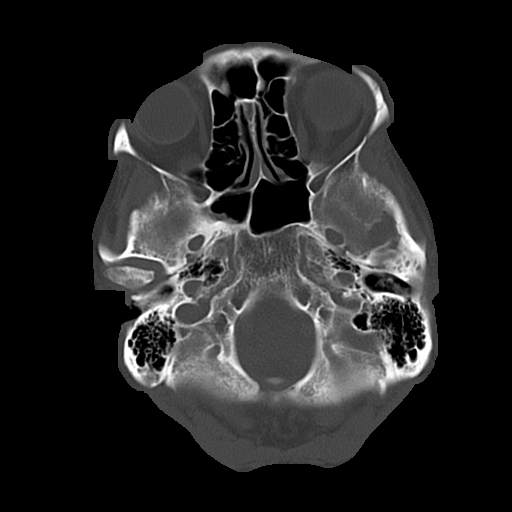
[im 10/28  bone]
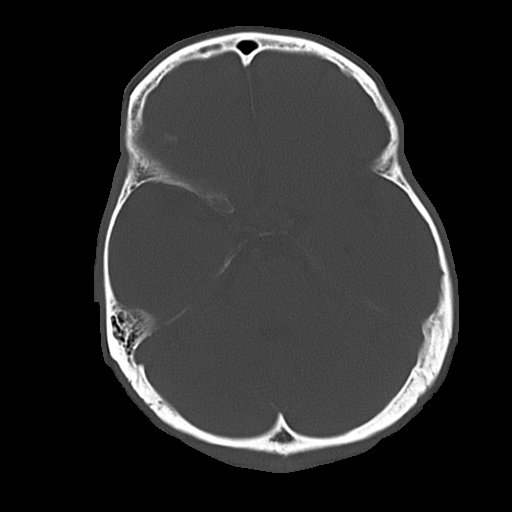
[im 13/28  bone]
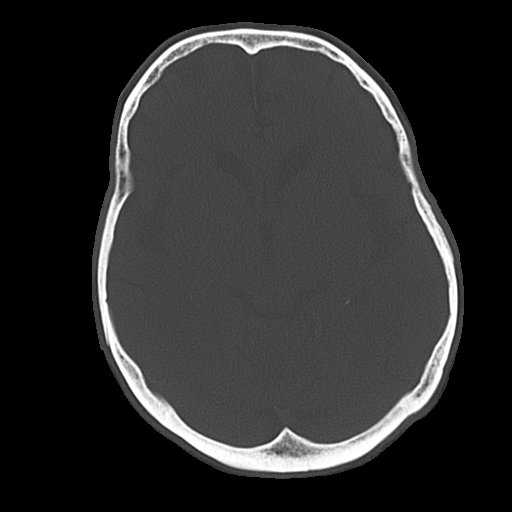
[im 15/28  bone]
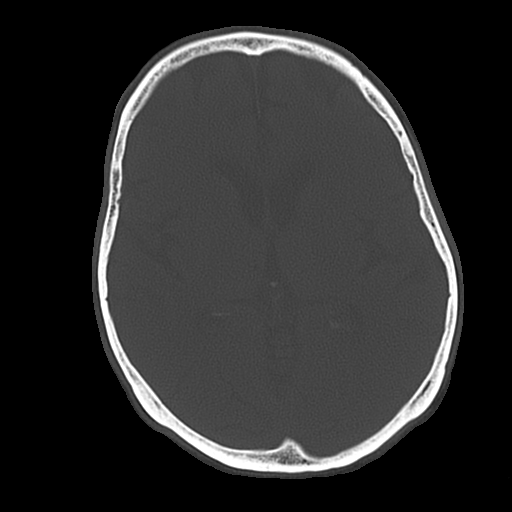
[im 18/28  bone]
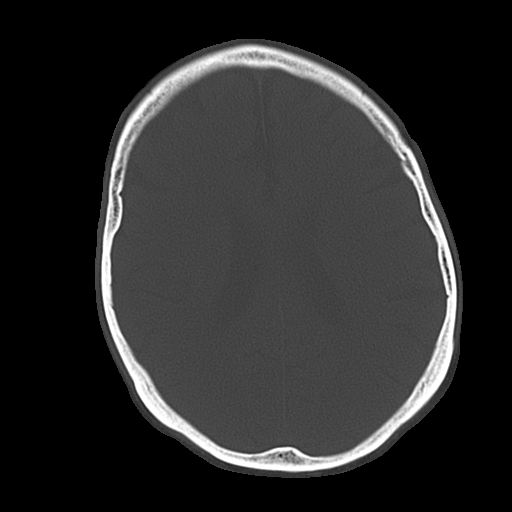
[im 23/28  bone]
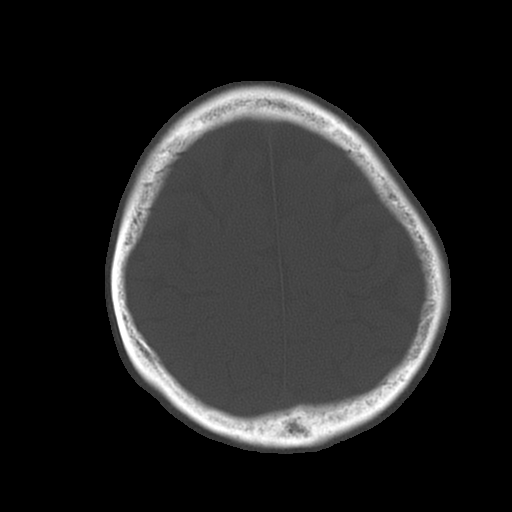

[17 of 30 positions shown; findings below may reference images not displayed]

FINDINGS: There is no evidence of acute intracranial hemorrhage, mass lesion,
brain edema or extra-axial fluid collection. The ventricles and
subarachnoid spaces are appropriately sized for age. There is no CT
evidence of acute cortical infarction. ...

The visualized paranasal sinuses, mastoid air cells and middle ears
are clear. The calvarium is intact.
IMPRESSION: Stable examination.  No acute intracranial findings.

## 2014-10-04 ENCOUNTER — Encounter (INDEPENDENT_AMBULATORY_CARE_PROVIDER_SITE_OTHER): Payer: Medicare Other | Admitting: Ophthalmology

## 2014-10-09 ENCOUNTER — Encounter (INDEPENDENT_AMBULATORY_CARE_PROVIDER_SITE_OTHER): Payer: Medicare Other | Admitting: Ophthalmology

## 2014-10-27 ENCOUNTER — Encounter (INDEPENDENT_AMBULATORY_CARE_PROVIDER_SITE_OTHER): Payer: Medicare Other | Admitting: Ophthalmology

## 2014-10-27 DIAGNOSIS — I10 Essential (primary) hypertension: Secondary | ICD-10-CM

## 2014-10-27 DIAGNOSIS — H34831 Tributary (branch) retinal vein occlusion, right eye: Secondary | ICD-10-CM

## 2014-10-27 DIAGNOSIS — H35033 Hypertensive retinopathy, bilateral: Secondary | ICD-10-CM | POA: Diagnosis not present

## 2014-10-27 DIAGNOSIS — E11319 Type 2 diabetes mellitus with unspecified diabetic retinopathy without macular edema: Secondary | ICD-10-CM | POA: Diagnosis not present

## 2014-10-27 DIAGNOSIS — H2513 Age-related nuclear cataract, bilateral: Secondary | ICD-10-CM

## 2014-10-27 DIAGNOSIS — E11339 Type 2 diabetes mellitus with moderate nonproliferative diabetic retinopathy without macular edema: Secondary | ICD-10-CM

## 2014-10-27 DIAGNOSIS — E11329 Type 2 diabetes mellitus with mild nonproliferative diabetic retinopathy without macular edema: Secondary | ICD-10-CM

## 2014-10-27 DIAGNOSIS — H43813 Vitreous degeneration, bilateral: Secondary | ICD-10-CM

## 2014-11-22 ENCOUNTER — Encounter (INDEPENDENT_AMBULATORY_CARE_PROVIDER_SITE_OTHER): Payer: Medicare Other | Admitting: Ophthalmology

## 2014-11-23 ENCOUNTER — Encounter (INDEPENDENT_AMBULATORY_CARE_PROVIDER_SITE_OTHER): Payer: Medicare Other | Admitting: Ophthalmology

## 2014-11-27 ENCOUNTER — Encounter (INDEPENDENT_AMBULATORY_CARE_PROVIDER_SITE_OTHER): Payer: Medicare Other | Admitting: Ophthalmology

## 2014-11-29 ENCOUNTER — Encounter (INDEPENDENT_AMBULATORY_CARE_PROVIDER_SITE_OTHER): Payer: Medicare Other | Admitting: Ophthalmology

## 2014-11-29 DIAGNOSIS — H34831 Tributary (branch) retinal vein occlusion, right eye: Secondary | ICD-10-CM | POA: Diagnosis not present

## 2014-11-29 DIAGNOSIS — H35033 Hypertensive retinopathy, bilateral: Secondary | ICD-10-CM | POA: Diagnosis not present

## 2014-11-29 DIAGNOSIS — H43813 Vitreous degeneration, bilateral: Secondary | ICD-10-CM

## 2014-11-29 DIAGNOSIS — E11339 Type 2 diabetes mellitus with moderate nonproliferative diabetic retinopathy without macular edema: Secondary | ICD-10-CM

## 2014-11-29 DIAGNOSIS — E11319 Type 2 diabetes mellitus with unspecified diabetic retinopathy without macular edema: Secondary | ICD-10-CM | POA: Diagnosis not present

## 2014-11-29 DIAGNOSIS — I10 Essential (primary) hypertension: Secondary | ICD-10-CM

## 2014-11-29 DIAGNOSIS — E11329 Type 2 diabetes mellitus with mild nonproliferative diabetic retinopathy without macular edema: Secondary | ICD-10-CM

## 2014-12-27 ENCOUNTER — Encounter (INDEPENDENT_AMBULATORY_CARE_PROVIDER_SITE_OTHER): Payer: Medicare Other | Admitting: Ophthalmology

## 2014-12-27 DIAGNOSIS — E11319 Type 2 diabetes mellitus with unspecified diabetic retinopathy without macular edema: Secondary | ICD-10-CM | POA: Diagnosis not present

## 2014-12-27 DIAGNOSIS — E11329 Type 2 diabetes mellitus with mild nonproliferative diabetic retinopathy without macular edema: Secondary | ICD-10-CM

## 2014-12-27 DIAGNOSIS — H34831 Tributary (branch) retinal vein occlusion, right eye: Secondary | ICD-10-CM | POA: Diagnosis not present

## 2014-12-27 DIAGNOSIS — H35033 Hypertensive retinopathy, bilateral: Secondary | ICD-10-CM

## 2014-12-27 DIAGNOSIS — I10 Essential (primary) hypertension: Secondary | ICD-10-CM

## 2014-12-27 DIAGNOSIS — H43813 Vitreous degeneration, bilateral: Secondary | ICD-10-CM

## 2014-12-27 DIAGNOSIS — E11339 Type 2 diabetes mellitus with moderate nonproliferative diabetic retinopathy without macular edema: Secondary | ICD-10-CM | POA: Diagnosis not present

## 2015-01-24 ENCOUNTER — Encounter (INDEPENDENT_AMBULATORY_CARE_PROVIDER_SITE_OTHER): Payer: Medicare Other | Admitting: Ophthalmology

## 2015-01-24 DIAGNOSIS — E11329 Type 2 diabetes mellitus with mild nonproliferative diabetic retinopathy without macular edema: Secondary | ICD-10-CM | POA: Diagnosis not present

## 2015-01-24 DIAGNOSIS — E11319 Type 2 diabetes mellitus with unspecified diabetic retinopathy without macular edema: Secondary | ICD-10-CM | POA: Diagnosis not present

## 2015-01-24 DIAGNOSIS — H43813 Vitreous degeneration, bilateral: Secondary | ICD-10-CM | POA: Diagnosis not present

## 2015-01-24 DIAGNOSIS — H2513 Age-related nuclear cataract, bilateral: Secondary | ICD-10-CM | POA: Diagnosis not present

## 2015-01-24 DIAGNOSIS — I10 Essential (primary) hypertension: Secondary | ICD-10-CM | POA: Diagnosis not present

## 2015-01-24 DIAGNOSIS — H34831 Tributary (branch) retinal vein occlusion, right eye: Secondary | ICD-10-CM | POA: Diagnosis not present

## 2015-01-24 DIAGNOSIS — H35033 Hypertensive retinopathy, bilateral: Secondary | ICD-10-CM | POA: Diagnosis not present

## 2015-02-15 ENCOUNTER — Other Ambulatory Visit (INDEPENDENT_AMBULATORY_CARE_PROVIDER_SITE_OTHER): Payer: Medicare Other | Admitting: Ophthalmology

## 2015-02-28 ENCOUNTER — Encounter (INDEPENDENT_AMBULATORY_CARE_PROVIDER_SITE_OTHER): Payer: Medicare Other | Admitting: Ophthalmology

## 2015-03-02 ENCOUNTER — Encounter (INDEPENDENT_AMBULATORY_CARE_PROVIDER_SITE_OTHER): Payer: Medicare Other | Admitting: Ophthalmology

## 2015-03-07 ENCOUNTER — Encounter (INDEPENDENT_AMBULATORY_CARE_PROVIDER_SITE_OTHER): Payer: Medicare Other | Admitting: Ophthalmology

## 2015-03-19 ENCOUNTER — Emergency Department (HOSPITAL_COMMUNITY): Payer: Medicare HMO

## 2015-03-19 ENCOUNTER — Inpatient Hospital Stay (HOSPITAL_COMMUNITY): Payer: Medicare HMO

## 2015-03-19 ENCOUNTER — Inpatient Hospital Stay (HOSPITAL_COMMUNITY)
Admission: EM | Admit: 2015-03-19 | Discharge: 2015-03-23 | DRG: 064 | Disposition: A | Discharge status: Home / Self Care | Payer: Medicare HMO | Attending: Internal Medicine | Admitting: Internal Medicine

## 2015-03-19 ENCOUNTER — Encounter (HOSPITAL_COMMUNITY): Payer: Self-pay | Admitting: *Deleted

## 2015-03-19 DIAGNOSIS — I9589 Other hypotension: Secondary | ICD-10-CM

## 2015-03-19 DIAGNOSIS — I959 Hypotension, unspecified: Secondary | ICD-10-CM | POA: Diagnosis present

## 2015-03-19 DIAGNOSIS — R569 Unspecified convulsions: Secondary | ICD-10-CM

## 2015-03-19 DIAGNOSIS — M5030 Other cervical disc degeneration, unspecified cervical region: Secondary | ICD-10-CM | POA: Diagnosis present

## 2015-03-19 DIAGNOSIS — R4182 Altered mental status, unspecified: Secondary | ICD-10-CM

## 2015-03-19 DIAGNOSIS — E860 Dehydration: Secondary | ICD-10-CM | POA: Diagnosis present

## 2015-03-19 DIAGNOSIS — E872 Acidosis, unspecified: Secondary | ICD-10-CM

## 2015-03-19 DIAGNOSIS — E876 Hypokalemia: Secondary | ICD-10-CM

## 2015-03-19 DIAGNOSIS — E1310 Other specified diabetes mellitus with ketoacidosis without coma: Secondary | ICD-10-CM | POA: Diagnosis present

## 2015-03-19 DIAGNOSIS — D638 Anemia in other chronic diseases classified elsewhere: Secondary | ICD-10-CM

## 2015-03-19 DIAGNOSIS — E1110 Type 2 diabetes mellitus with ketoacidosis without coma: Secondary | ICD-10-CM

## 2015-03-19 DIAGNOSIS — E871 Hypo-osmolality and hyponatremia: Secondary | ICD-10-CM

## 2015-03-19 DIAGNOSIS — N183 Chronic kidney disease, stage 3 unspecified: Secondary | ICD-10-CM

## 2015-03-19 DIAGNOSIS — Z7982 Long term (current) use of aspirin: Secondary | ICD-10-CM

## 2015-03-19 DIAGNOSIS — E1165 Type 2 diabetes mellitus with hyperglycemia: Secondary | ICD-10-CM | POA: Diagnosis present

## 2015-03-19 DIAGNOSIS — I739 Peripheral vascular disease, unspecified: Secondary | ICD-10-CM | POA: Diagnosis present

## 2015-03-19 DIAGNOSIS — Z87891 Personal history of nicotine dependence: Secondary | ICD-10-CM

## 2015-03-19 DIAGNOSIS — R6510 Systemic inflammatory response syndrome (SIRS) of non-infectious origin without acute organ dysfunction: Secondary | ICD-10-CM | POA: Diagnosis present

## 2015-03-19 DIAGNOSIS — I639 Cerebral infarction, unspecified: Secondary | ICD-10-CM | POA: Diagnosis present

## 2015-03-19 DIAGNOSIS — A419 Sepsis, unspecified organism: Secondary | ICD-10-CM

## 2015-03-19 DIAGNOSIS — I6381 Other cerebral infarction due to occlusion or stenosis of small artery: Secondary | ICD-10-CM

## 2015-03-19 DIAGNOSIS — E119 Type 2 diabetes mellitus without complications: Secondary | ICD-10-CM | POA: Diagnosis not present

## 2015-03-19 DIAGNOSIS — I10 Essential (primary) hypertension: Secondary | ICD-10-CM

## 2015-03-19 DIAGNOSIS — I071 Rheumatic tricuspid insufficiency: Secondary | ICD-10-CM | POA: Diagnosis present

## 2015-03-19 DIAGNOSIS — N182 Chronic kidney disease, stage 2 (mild): Secondary | ICD-10-CM

## 2015-03-19 DIAGNOSIS — I129 Hypertensive chronic kidney disease with stage 1 through stage 4 chronic kidney disease, or unspecified chronic kidney disease: Secondary | ICD-10-CM | POA: Diagnosis present

## 2015-03-19 DIAGNOSIS — Z794 Long term (current) use of insulin: Secondary | ICD-10-CM | POA: Diagnosis not present

## 2015-03-19 DIAGNOSIS — E1100 Type 2 diabetes mellitus with hyperosmolarity without nonketotic hyperglycemic-hyperosmolar coma (NKHHC): Secondary | ICD-10-CM

## 2015-03-19 DIAGNOSIS — N189 Chronic kidney disease, unspecified: Secondary | ICD-10-CM

## 2015-03-19 DIAGNOSIS — R600 Localized edema: Secondary | ICD-10-CM

## 2015-03-19 DIAGNOSIS — Z452 Encounter for adjustment and management of vascular access device: Secondary | ICD-10-CM

## 2015-03-19 DIAGNOSIS — E162 Hypoglycemia, unspecified: Secondary | ICD-10-CM

## 2015-03-19 DIAGNOSIS — N179 Acute kidney failure, unspecified: Secondary | ICD-10-CM

## 2015-03-19 DIAGNOSIS — G9340 Encephalopathy, unspecified: Secondary | ICD-10-CM

## 2015-03-19 DIAGNOSIS — G8929 Other chronic pain: Secondary | ICD-10-CM | POA: Diagnosis present

## 2015-03-19 DIAGNOSIS — R739 Hyperglycemia, unspecified: Secondary | ICD-10-CM

## 2015-03-19 DIAGNOSIS — I509 Heart failure, unspecified: Secondary | ICD-10-CM | POA: Diagnosis not present

## 2015-03-19 DIAGNOSIS — Z95828 Presence of other vascular implants and grafts: Secondary | ICD-10-CM

## 2015-03-19 DIAGNOSIS — Z96641 Presence of right artificial hip joint: Secondary | ICD-10-CM | POA: Diagnosis present

## 2015-03-19 DIAGNOSIS — Z789 Other specified health status: Secondary | ICD-10-CM

## 2015-03-19 DIAGNOSIS — D62 Acute posthemorrhagic anemia: Secondary | ICD-10-CM

## 2015-03-19 DIAGNOSIS — W19XXXA Unspecified fall, initial encounter: Secondary | ICD-10-CM

## 2015-03-19 DIAGNOSIS — M549 Dorsalgia, unspecified: Secondary | ICD-10-CM | POA: Diagnosis present

## 2015-03-19 DIAGNOSIS — R509 Fever, unspecified: Secondary | ICD-10-CM

## 2015-03-19 DIAGNOSIS — E0810 Diabetes mellitus due to underlying condition with ketoacidosis without coma: Secondary | ICD-10-CM

## 2015-03-19 LAB — CBC WITH DIFFERENTIAL/PLATELET
Basophils Absolute: 0 10*3/uL (ref 0.0–0.1)
Basophils Relative: 0 % (ref 0–1)
Eosinophils Absolute: 0 10*3/uL (ref 0.0–0.7)
Eosinophils Relative: 0 % (ref 0–5)
HCT: 35.6 % — ABNORMAL LOW (ref 39.0–52.0)
Hemoglobin: 10.4 g/dL — ABNORMAL LOW (ref 13.0–17.0)
Lymphocytes Relative: 7 % — ABNORMAL LOW (ref 12–46)
Lymphs Abs: 0.8 10*3/uL (ref 0.7–4.0)
MCH: 29.4 pg (ref 26.0–34.0)
MCHC: 29.2 g/dL — ABNORMAL LOW (ref 30.0–36.0)
MCV: 100.6 fL — ABNORMAL HIGH (ref 78.0–100.0)
Monocytes Absolute: 0.9 10*3/uL (ref 0.1–1.0)
Monocytes Relative: 8 % (ref 3–12)
Neutro Abs: 9.4 10*3/uL — ABNORMAL HIGH (ref 1.7–7.7)
Neutrophils Relative %: 85 % — ABNORMAL HIGH (ref 43–77)
Platelets: 165 10*3/uL (ref 150–400)
RBC: 3.54 MIL/uL — ABNORMAL LOW (ref 4.22–5.81)
RDW: 13.9 % (ref 11.5–15.5)
WBC: 11.1 10*3/uL — ABNORMAL HIGH (ref 4.0–10.5)

## 2015-03-19 LAB — URINALYSIS, ROUTINE W REFLEX MICROSCOPIC
Bilirubin Urine: NEGATIVE
Glucose, UA: >1000 mg/dL — AB
Hgb urine dipstick: NEGATIVE
Ketones, ur: 15 mg/dL — AB
Leukocytes, UA: NEGATIVE
Nitrite: NEGATIVE
Protein, ur: 30 mg/dL — AB
Specific Gravity, Urine: 1.023 (ref 1.005–1.030)
Urobilinogen, UA: 0.2 mg/dL (ref 0.0–1.0)
pH: 5 (ref 5.0–8.0)

## 2015-03-19 LAB — I-STAT CG4 LACTIC ACID, ED
Lactic Acid, Venous: 4.59 mmol/L (ref 0.5–2.0)
Lactic Acid, Venous: 4.88 mmol/L (ref 0.5–2.0)
Lactic Acid, Venous: 5.05 mmol/L (ref 0.5–2.0)

## 2015-03-19 LAB — COMPREHENSIVE METABOLIC PANEL
ALT: 11 U/L — ABNORMAL LOW (ref 17–63)
AST: 25 U/L (ref 15–41)
Albumin: 3.5 g/dL (ref 3.5–5.0)
Alkaline Phosphatase: 86 U/L (ref 38–126)
Anion gap: 24 — ABNORMAL HIGH (ref 5–15)
BUN: 78 mg/dL — ABNORMAL HIGH (ref 6–20)
CO2: 10 mmol/L — ABNORMAL LOW (ref 22–32)
Calcium: 8.2 mg/dL — ABNORMAL LOW (ref 8.9–10.3)
Chloride: 93 mmol/L — ABNORMAL LOW (ref 101–111)
Creatinine, Ser: 5.44 mg/dL — ABNORMAL HIGH (ref 0.61–1.24)
GFR calc Af Amer: 11 mL/min — ABNORMAL LOW (ref >60)
GFR calc non Af Amer: 10 mL/min — ABNORMAL LOW (ref >60)
Glucose, Bld: 1298 mg/dL (ref 65–99)
Potassium: 4.6 mmol/L (ref 3.5–5.1)
Sodium: 127 mmol/L — ABNORMAL LOW (ref 135–145)
Total Bilirubin: 1.3 mg/dL — ABNORMAL HIGH (ref 0.3–1.2)
Total Protein: 6.2 g/dL — ABNORMAL LOW (ref 6.5–8.1)

## 2015-03-19 LAB — URINE MICROSCOPIC-ADD ON

## 2015-03-19 LAB — CBG MONITORING, ED: Glucose-Capillary: >600 mg/dL (ref 65–99)

## 2015-03-19 MED ORDER — SODIUM CHLORIDE 0.9 % IV SOLN
INTRAVENOUS | Status: DC
  Administered 2015-03-19: 23:00:00 via INTRAVENOUS

## 2015-03-19 MED ORDER — PIPERACILLIN-TAZOBACTAM 3.375 G IVPB 30 MIN
3.3750 g | Freq: Once | INTRAVENOUS | Status: AC
  Administered 2015-03-19: 3.375 g via INTRAVENOUS
  Filled 2015-03-19: qty 50

## 2015-03-19 MED ORDER — SODIUM CHLORIDE 0.9 % IV BOLUS (SEPSIS)
1000.0000 mL | INTRAVENOUS | Status: AC
  Administered 2015-03-19 (×2): 1000 mL via INTRAVENOUS

## 2015-03-19 MED ORDER — PIPERACILLIN-TAZOBACTAM IN DEX 2-0.25 GM/50ML IV SOLN
2.2500 g | Freq: Three times a day (TID) | INTRAVENOUS | Status: DC
  Administered 2015-03-20 – 2015-03-21 (×4): 2.25 g via INTRAVENOUS
  Filled 2015-03-19 (×8): qty 50

## 2015-03-19 MED ORDER — DEXTROSE-NACL 5-0.45 % IV SOLN: INTRAVENOUS | Status: DC

## 2015-03-19 MED ORDER — SODIUM CHLORIDE 0.9 % IV SOLN
INTRAVENOUS | Status: DC
  Administered 2015-03-19: 5.4 [IU]/h via INTRAVENOUS
  Filled 2015-03-19: qty 2.5

## 2015-03-19 MED ORDER — VANCOMYCIN HCL IN DEXTROSE 1-5 GM/200ML-% IV SOLN
1000.0000 mg | INTRAVENOUS | Status: DC
  Filled 2015-03-19: qty 200

## 2015-03-19 MED ORDER — VANCOMYCIN HCL IN DEXTROSE 1-5 GM/200ML-% IV SOLN
1000.0000 mg | Freq: Once | INTRAVENOUS | Status: AC
  Administered 2015-03-19: 1000 mg via INTRAVENOUS
  Filled 2015-03-19: qty 200

## 2015-03-19 MED ORDER — SODIUM CHLORIDE 0.9 % IV BOLUS (SEPSIS)
500.0000 mL | Freq: Once | INTRAVENOUS | Status: AC
  Administered 2015-03-19: 500 mL via INTRAVENOUS

## 2015-03-19 MED ORDER — SODIUM CHLORIDE 0.9 % IV SOLN
INTRAVENOUS | Status: DC
  Administered 2015-03-19: 21:00:00 via INTRAVENOUS

## 2015-03-19 NOTE — ED Notes (Signed)
Pt was at  His group home and fell earlier.  He has tenderness to his lower back.  He complains of being out of insulin for awhile now and borrowed humalog 40units from a friend. Unaware of how many units he has administered out of the 40units.  Pt is hot to touch and complains of pain during urination.  Coordination is off and lethargic.  Vital signs are as follows: BP:107/51 HR:118 Resp: 32 Temp: 101

## 2015-03-19 NOTE — H&P (Signed)
[facility] History and Physical  Patient: [name] [name]  MRN: [id number]  DOB: 02/17/1946  DOS: the patient was seen and examined on 03/19/2015 PCP: Guadlupe Spanish, MD  Chief Complaint: Confusion  HPI: [name] [name] is a 69 y.o. male with Past medical history of diabetes mellitus, chronic kidney disease, hypertension, chronic back pain. The patient is presenting with complaints of generalized weakness and lethargy that started today. The history was obtained from patient's sister with whom the patient has been living since last one month. The patient was at his baseline earlier in the morning as per patient's sister and when she left at 11:00 he was sitting in the recliner. When she comes back from the work patient reports a fall during the day and has been confused and lethargic. No fever no chills no nausea no vomiting no diarrhea reported. Patient has ran out of his insulin for last 2 days and is currently using his sister's short-acting insulin which she uses for her insulin pump. Patient has also ran out of his other medications. Patient has been drowsy and lethargic but denies any complaints of headache, dizziness, vision changes, chest pain, abdominal pain, any other focal deficit.  The patient is coming from home. And at his baseline independent for most of his ADL.  Review of Systems: as mentioned in the history of present illness.  A comprehensive review of the other systems is negative.  Past Medical History  Diagnosis Date  . Diabetes mellitus without complication   . Hypertension   . Hernia   . Back pain   . Seizures    Past Surgical History  Procedure Laterality Date  . No past surgeries    . Hip arthroplasty Right 02/05/2013    Procedure: ARTHROPLASTY BIPOLAR HIP;  Surgeon: [name], MD;  Location: [facility];  Service: Orthopedics;  Laterality: Right;   Social History:  reports that he quit smoking about 2 years ago. His smoking use included Cigarettes. He  has a 7.5 pack-year smoking history. He has never used smokeless tobacco. He reports that he drinks alcohol. He reports that he does not use illicit drugs.  No Known Allergies  No family history on file.  Prior to Admission medications   Medication Sig Start Date End Date Taking? Authorizing Provider  alprazolam [name]) 2 MG tablet Take 2 mg by mouth 2 (two) times daily as needed for anxiety.  06/10/13   Historical Provider, MD  FLUoxetine (PROZAC) 20 MG capsule Take 20 mg by mouth daily.    Historical Provider, MD  insulin aspart protamine- aspart (NOVOLOG MIX 70/30) (70-30) 100 UNIT/ML injection Inject 0.1 mLs (10 Units total) into the skin 2 (two) times daily. 03/22/14   Venetia Maxon Rama, MD  insulin glargine (LANTUS) 100 UNIT/ML injection Inject 0.15 mLs (15 Units total) into the skin at bedtime. 03/22/14   Christina P Rama, MD  oxycodone (ROXICODONE) 30 MG immediate release tablet Take 30 mg by mouth every 4 (four) hours as needed for pain.     Historical Provider, MD  ramipril (ALTACE) 10 MG capsule Take 10 mg by mouth every morning.    Historical Provider, MD    Physical Exam: Filed Vitals:   03/19/15 2127 03/19/15 2130 03/19/15 2245 03/19/15 2315  BP:  97/42 92/46 85/46   Pulse:  72 64 63  Temp:      TempSrc:      Resp:  20 20 20   Height: 5' 8.9" (1.75 m)     Weight: 72.576 kg (  160 lb)     SpO2:  100% 97% 100%    General: Drowsy and lethargic and Oriented to Time, Place and Person. Appear in mild distress Eyes: PERRL ENT: Oral Mucosa clear dry. Neck: No JVD Cardiovascular: S1 and S2 Present, no Murmur, Peripheral Pulses Present Respiratory: Bilateral Air entry equal and Decreased,  Clear to Auscultation, no Crackles, no wheezes Abdomen: Bowel Sound present, Soft and non tender Skin: no Rash Extremities: Trace Pedal edema, no calf tenderness Neurologic: Grossly no focal neuro deficit.  Labs on Admission:  CBC:  Recent Labs Lab 03/19/15 2008  WBC 11.1*  NEUTROABS  9.4*  HGB 10.4*  HCT 35.6*  MCV 100.6*  PLT 165    CMP     Component Value Date/Time   NA 127* 03/19/2015 2008   K 4.6 03/19/2015 2008   CL 93* 03/19/2015 2008   CO2 10* 03/19/2015 2008   GLUCOSE 1298* 03/19/2015 2008   BUN 78* 03/19/2015 2008   CREATININE 5.44* 03/19/2015 2008   CALCIUM 8.2* 03/19/2015 2008   PROT 6.2* 03/19/2015 2008   ALBUMIN 3.5 03/19/2015 2008   AST 25 03/19/2015 2008   ALT 11* 03/19/2015 2008   ALKPHOS 86 03/19/2015 2008   BILITOT 1.3* 03/19/2015 2008   GFRNONAA 10* 03/19/2015 2008   GFRAA 11* 03/19/2015 2008    No results for input(s): LIPASE, AMYLASE in the last 168 hours.  No results for input(s): CKTOTAL, CKMB, CKMBINDEX, TROPONINI in the last 168 hours. BNP (last 3 results) No results for input(s): BNP in the last 8760 hours.  ProBNP (last 3 results) No results for input(s): PROBNP in the last 8760 hours.   Radiological Exams on Admission: Dg Lumbar Spine Complete  03/19/2015   CLINICAL DATA:  Altered mental status. [place] earlier today in his assisted living home.  EXAM: LUMBAR SPINE - COMPLETE 4+ VIEW  COMPARISON:  None.  FINDINGS: There is no evidence of lumbar spine fracture. Alignment is normal. Degenerative changes at L4-5 and L5-S1 chronic, with disc space narrowing, and slight facet mediated slip at L4-5. No worrisome osseous lesions. New RIGHT hip replacement appears uncomplicated.  IMPRESSION: Degenerative disc disease.  No acute abnormality.   Electronically Signed   By: Rolla Flatten M.D.   On: 03/19/2015 23:33   Ct Head Wo Contrast  03/19/2015   CLINICAL DATA:  Recent fall, altered mental status  EXAM: CT HEAD WITHOUT CONTRAST  TECHNIQUE: Contiguous axial images were obtained from the base of the skull through the vertex without intravenous contrast.  COMPARISON:  03/19/2014  FINDINGS: Bony calvarium is intact. The paranasal sinuses and mastoid air cells are well aerated. Mild atrophic changes are seen. No findings to suggest acute  hemorrhage, acute infarction or space-occupying mass lesion are noted.  IMPRESSION: Chronic atrophic changes without acute abnormality.   Electronically Signed   By: Inez Catalina M.D.   On: 03/19/2015 22:00   Dg Chest Port 1 View  03/19/2015   CLINICAL DATA:  Fever for 1 day  EXAM: PORTABLE CHEST - 1 VIEW  COMPARISON:  03/19/2014  FINDINGS: The heart size and mediastinal contours are within normal limits. Both lungs are clear. The visualized skeletal structures are unremarkable.  IMPRESSION: No active disease.   Electronically Signed   By: Kathreen Devoid   On: 03/19/2015 19:40   Assessment/Plan Principal Problem:   DKA (diabetic ketoacidoses) Active Problems:   Anemia of chronic disease   Fall   HTN (hypertension)   Lactic acidosis   Encephalopathy acute  Acute-on-chronic kidney injury   1. DKA (diabetic ketoacidoses)  the patient is presenting with complaints of confusion and lethargy. CT of the head is negative. Lumbar spine as well as chest x-ray were also negative. His found to have an anion gap of 24 with lactic acidosis as well as elevated PTH. He also has acute on chronic kidney disease. With this the patient will be started on insulin with glucose stabilizer. We will aggressively hydrate him. Monitor his ins and outs closely. Patient remains nothing by mouth except medication. We will monitor his BMP every 2 hours. An A1c.  2. acute encephalopathy. Possible sepsis. The patient presents with hypotension and confusion. He has some leukocytosis. His urine appears clear chest x-ray is clear but possibility of infection cannot be ruled out. Currently I would empirically treated with vancomycin and Zosyn. Monitor cultures and adjust antibiotics accordingly.  3.Acute on chronic kidney disease. That is secondary to DKA and dehydration. We will hydrate him aggressively. Monitor BMP closely. If does not improve we will get ultrasound renal.  4. Fall. Patient does not appear to  have any acute fracture. Continue fall precaution.  Advance goals of care discussion: Full code  DVT Prophylaxis: subcutaneous Heparin Nutrition: Nothing by mouth Family Communication: family was present at bedside, opportunity was given to ask question and all questions were answered satisfactorily at the time of interview. Disposition: Admitted as inpatient,step-down unit.  Author: Berle Mull, MD [facility] Pager: [phone] 03/19/2015  If 7PM-7AM, please contact night-coverage [email or web address] Password [email or web address]

## 2015-03-19 NOTE — ED Notes (Signed)
Pt continues to be lethargic, will respond to loud stimuli only then goes back to sleep,.

## 2015-03-19 NOTE — ED Provider Notes (Signed)
CSN: [id number]     Arrival date & time 03/19/15  [id number] History   First MD Initiated Contact with Patient 03/19/15 1847     Chief Complaint  Patient presents with  . Altered Mental Status     (Consider location/radiation/quality/duration/timing/severity/associated sxs/prior Treatment) Patient is a 69 y.o. male presenting with altered mental status. The history is provided by the patient and the EMS personnel.  Altered Mental Status Presenting symptoms: confusion   Associated symptoms: abdominal pain, fever and rash   Associated symptoms: no headaches, no nausea and no vomiting    patient brought in by EMS from a group home. Supposedly there was a fall earlier today. The patient's had weakness. Patient complaining of back pain following the fall. With movement of his legs however. Also complaining of pain with urination. Patient states that he's been out of insulin for a while and Baral deaf 40 units from a friend he suddenly. According DMS patient's coordination was off and he seemed lethargic. They had a temp reported of 101.  Past Medical History  Diagnosis Date  . Diabetes mellitus without complication   . Hypertension   . Hernia   . Back pain   . Seizures    Past Surgical History  Procedure Laterality Date  . No past surgeries    . Hip arthroplasty Right 02/05/2013    Procedure: ARTHROPLASTY BIPOLAR HIP;  Surgeon: [name], MD;  Location: [facility];  Service: Orthopedics;  Laterality: Right;   No family history on file. History  Substance Use Topics  . Smoking status: Former Smoker -- 0.25 packs/day for 30 years    Types: Cigarettes    Quit date: 01/31/2013  . Smokeless tobacco: Never Used  . Alcohol Use: Yes    Review of Systems  Constitutional: Positive for fever and fatigue.  Eyes: Negative for redness.  Respiratory: Negative for shortness of breath.   Cardiovascular: Negative for chest pain.  Gastrointestinal: Positive for abdominal pain. Negative for nausea,  vomiting and diarrhea.  Genitourinary: Positive for dysuria.  Musculoskeletal: Positive for back pain.  Skin: Positive for rash.  Neurological: Negative for headaches.  Hematological: Does not bruise/bleed easily.  Psychiatric/Behavioral: Positive for confusion.      Allergies  Review of patient's allergies indicates no known allergies.  Home Medications   Prior to Admission medications   Medication Sig Start Date End Date Taking? Authorizing Provider  alprazolam [name]) 2 MG tablet Take 2 mg by mouth 2 (two) times daily as needed for anxiety.  06/10/13   Historical Provider, MD  FLUoxetine (PROZAC) 20 MG capsule Take 20 mg by mouth daily.    Historical Provider, MD  insulin aspart protamine- aspart (NOVOLOG MIX 70/30) (70-30) 100 UNIT/ML injection Inject 0.1 mLs (10 Units total) into the skin 2 (two) times daily. 03/22/14   Venetia Maxon Rama, MD  insulin glargine (LANTUS) 100 UNIT/ML injection Inject 0.15 mLs (15 Units total) into the skin at bedtime. 03/22/14   Christina P Rama, MD  oxycodone (ROXICODONE) 30 MG immediate release tablet Take 30 mg by mouth every 4 (four) hours as needed for pain.     Historical Provider, MD  ramipril (ALTACE) 10 MG capsule Take 10 mg by mouth every morning.    Historical Provider, MD   BP 97/41 mmHg  Pulse 74  Temp(Src) 100.1 F (37.8 C) (Rectal)  Resp 28  Ht 5' 8.9" (1.75 m)  Wt 160 lb (72.576 kg)  BMI 23.70 kg/m2  SpO2 100% Physical Exam  Constitutional:  He appears well-developed and well-nourished. No distress.  HENT:  Head: Normocephalic and atraumatic.  Mouth/Throat: Oropharynx is clear and moist.  Eyes: Conjunctivae are normal. Pupils are equal, round, and reactive to light.  Neck: Normal range of motion.  Cardiovascular: Normal rate, regular rhythm and normal heart sounds.   Pulmonary/Chest: Effort normal and breath sounds normal. No respiratory distress.  Abdominal: Soft. There is no tenderness.  Neurological: He is alert. No cranial  nerve deficit. He exhibits normal muscle tone. Coordination normal.  Skin: Skin is warm. No rash noted.  Nursing note and vitals reviewed.   ED Course  Procedures (including critical care time) Labs Review Labs Reviewed  COMPREHENSIVE METABOLIC PANEL - Abnormal; Notable for the following:    Sodium 127 (*)    Chloride 93 (*)    CO2 10 (*)    Glucose, Bld 1298 (*)    BUN 78 (*)    Creatinine, Ser 5.44 (*)    Calcium 8.2 (*)    Total Protein 6.2 (*)    ALT 11 (*)    Total Bilirubin 1.3 (*)    GFR calc non Af Amer 10 (*)    GFR calc Af Amer 11 (*)    Anion gap 24 (*)    All other components within normal limits  CBC WITH DIFFERENTIAL/PLATELET - Abnormal; Notable for the following:    WBC 11.1 (*)    RBC 3.54 (*)    Hemoglobin 10.4 (*)    HCT 35.6 (*)    MCV 100.6 (*)    MCHC 29.2 (*)    Neutrophils Relative % 85 (*)    Neutro Abs 9.4 (*)    Lymphocytes Relative 7 (*)    All other components within normal limits  URINALYSIS, ROUTINE W REFLEX MICROSCOPIC (NOT AT [facility]) - Abnormal; Notable for the following:    APPearance CLOUDY (*)    Glucose, UA >1000 (*)    Ketones, ur 15 (*)    Protein, ur 30 (*)    All other components within normal limits  I-STAT CG4 LACTIC ACID, ED - Abnormal; Notable for the following:    Lactic Acid, Venous 5.05 (*)    All other components within normal limits  CBG MONITORING, ED - Abnormal; Notable for the following:    Glucose-Capillary >600 (*)    All other components within normal limits  I-STAT CG4 LACTIC ACID, ED - Abnormal; Notable for the following:    Lactic Acid, Venous 4.88 (*)    All other components within normal limits  CULTURE, BLOOD (ROUTINE X 2)  CULTURE, BLOOD (ROUTINE X 2)  URINE CULTURE  URINE MICROSCOPIC-ADD ON  I-STAT CG4 LACTIC ACID, ED   Results for orders placed or performed during the hospital encounter of 03/19/15  Comprehensive metabolic panel  Result Value Ref Range   Sodium 127 (L) 135 - 145 mmol/L    Potassium 4.6 3.5 - 5.1 mmol/L   Chloride 93 (L) 101 - 111 mmol/L   CO2 10 (L) 22 - 32 mmol/L   Glucose, Bld 1298 (HH) 65 - 99 mg/dL   BUN 78 (H) 6 - 20 mg/dL   Creatinine, Ser 5.44 (H) 0.61 - 1.24 mg/dL   Calcium 8.2 (L) 8.9 - 10.3 mg/dL   Total Protein 6.2 (L) 6.5 - 8.1 g/dL   Albumin 3.5 3.5 - 5.0 g/dL   AST 25 15 - 41 U/L   ALT 11 (L) 17 - 63 U/L   Alkaline Phosphatase 86 38 - 126 U/L  Total Bilirubin 1.3 (H) 0.3 - 1.2 mg/dL   GFR calc non Af Amer 10 (L) >60 mL/min   GFR calc Af Amer 11 (L) >60 mL/min   Anion gap 24 (H) 5 - 15  CBC with Differential  Result Value Ref Range   WBC 11.1 (H) 4.0 - 10.5 K/uL   RBC 3.54 (L) 4.22 - 5.81 MIL/uL   Hemoglobin 10.4 (L) 13.0 - 17.0 g/dL   HCT 35.6 (L) 39.0 - 52.0 %   MCV 100.6 (H) 78.0 - 100.0 fL   MCH 29.4 26.0 - 34.0 pg   MCHC 29.2 (L) 30.0 - 36.0 g/dL   RDW 13.9 11.5 - 15.5 %   Platelets 165 150 - 400 K/uL   Neutrophils Relative % 85 (H) 43 - 77 %   Neutro Abs 9.4 (H) 1.7 - 7.7 K/uL   Lymphocytes Relative 7 (L) 12 - 46 %   Lymphs Abs 0.8 0.7 - 4.0 K/uL   Monocytes Relative 8 3 - 12 %   Monocytes Absolute 0.9 0.1 - 1.0 K/uL   Eosinophils Relative 0 0 - 5 %   Eosinophils Absolute 0.0 0.0 - 0.7 K/uL   Basophils Relative 0 0 - 1 %   Basophils Absolute 0.0 0.0 - 0.1 K/uL  Urinalysis, Routine w reflex microscopic (not at [facility])  Result Value Ref Range   Color, Urine YELLOW YELLOW   APPearance CLOUDY (A) CLEAR   Specific Gravity, Urine 1.023 1.005 - 1.030   pH 5.0 5.0 - 8.0   Glucose, UA >1000 (A) NEGATIVE mg/dL   Hgb urine dipstick NEGATIVE NEGATIVE   Bilirubin Urine NEGATIVE NEGATIVE   Ketones, ur 15 (A) NEGATIVE mg/dL   Protein, ur 30 (A) NEGATIVE mg/dL   Urobilinogen, UA 0.2 0.0 - 1.0 mg/dL   Nitrite NEGATIVE NEGATIVE   Leukocytes, UA NEGATIVE NEGATIVE  Urine microscopic-add on  Result Value Ref Range   Squamous Epithelial / LPF RARE RARE   WBC, UA 0-2 <3 WBC/hpf   RBC / HPF 0-2 <3 RBC/hpf   Bacteria, UA RARE RARE    Urine-Other AMORPHOUS URATES/PHOSPHATES   I-Stat CG4 Lactic Acid, ED  Result Value Ref Range   Lactic Acid, Venous 5.05 (HH) 0.5 - 2.0 mmol/L   Comment NOTIFIED PHYSICIAN   CBG monitoring, ED  Result Value Ref Range   Glucose-Capillary >600 (HH) 65 - 99 mg/dL  I-Stat CG4 Lactic Acid, ED  (not at  [facility])  Result Value Ref Range   Lactic Acid, Venous 4.88 (HH) 0.5 - 2.0 mmol/L   Comment NOTIFIED PHYSICIAN      Imaging Review Ct Head Wo Contrast  03/19/2015   CLINICAL DATA:  Recent fall, altered mental status  EXAM: CT HEAD WITHOUT CONTRAST  TECHNIQUE: Contiguous axial images were obtained from the base of the skull through the vertex without intravenous contrast.  COMPARISON:  03/19/2014  FINDINGS: Bony calvarium is intact. The paranasal sinuses and mastoid air cells are well aerated. Mild atrophic changes are seen. No findings to suggest acute hemorrhage, acute infarction or space-occupying mass lesion are noted.  IMPRESSION: Chronic atrophic changes without acute abnormality.   Electronically Signed   By: Inez Catalina M.D.   On: 03/19/2015 22:00   Dg Chest Port 1 View  03/19/2015   CLINICAL DATA:  Fever for 1 day  EXAM: PORTABLE CHEST - 1 VIEW  COMPARISON:  03/19/2014  FINDINGS: The heart size and mediastinal contours are within normal limits. Both lungs are clear. The visualized skeletal  structures are unremarkable.  IMPRESSION: No active disease.   Electronically Signed   By: Kathreen Devoid   On: 03/19/2015 19:40     EKG Interpretation   Date/Time:  Monday March 19 2015 18:45:29 EDT Ventricular Rate:  99 PR Interval:  164 QRS Duration: 102 QT Interval:  360 QTC Calculation: 462 R Axis:   -54 Text Interpretation:  Sinus rhythm Probable left atrial enlargement  Incomplete RBBB and LAFB Confirmed by Harsha Yusko  MD, Anushka Hartinger ([id number]) on  03/19/2015 8:14:21 PM      CRITICAL CARE Performed by: Fredia Sorrow Total critical care time: 30 Critical care time was exclusive of separately  billable procedures and treating other patients. Critical care was necessary to treat or prevent imminent or life-threatening deterioration. Critical care was time spent personally by me on the following activities: development of treatment plan with patient and/or surrogate as well as nursing, discussions with consultants, evaluation of patient's response to treatment, examination of patient, obtaining history from patient or surrogate, ordering and performing treatments and interventions, ordering and review of laboratory studies, ordering and review of radiographic studies, pulse oximetry and re-evaluation of patient's condition.     MDM   Final diagnoses:  Fever  Altered mental status  Diabetic ketoacidosis without coma associated with diabetes mellitus due to underlying condition  Sepsis, due to unspecified organism   patient presented with some mild altered mental status and fever initial concerns were for sepsis. Lactic acid was markedly elevated. There was a significant delay in getting electrolyte results. Patient's blood sugar was in the 1200 range. For the elevated lactic acid patient had septic the IV fluid orders as well as Anna bodyaches. Although chest x-ray was negative urinalysis was negative. When the elevated blood sugar was noted patient was started on the glucose stabilizer.  Patient's head CT without any acute changes. 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 Chest x-ray was negative for pneumonia. Patient with a leukocytosis of 11,000 no significant anemia however hemoglobin was slightly low at 10.4. Patient's followed by the [facility] medical clinic. Patient will require admission.  Patient's blood pressures are still a little marginal in the low 90s. Patient was to receive 2 L of normal saline based on the sepsis protocol.  Additional historical notes that we got the patient from a group home he had a fall earlier complaining of  some tenderness to his low back. He fell from a standing position. He's been out of insulin for a period and was borrowing some. Patient was warm to touch upon arrival when he had complaints of pain during urination. Patient has a depressed mental status and came across as a of a little somnolent. Highest temp reported by EMS was [place], MD 03/19/15 2232

## 2015-03-19 NOTE — ED Notes (Signed)
551ml NS bolus infusing at this time

## 2015-03-19 NOTE — Progress Notes (Signed)
ANTIBIOTIC CONSULT NOTE - INITIAL  Pharmacy Consult for Vancomycin and Zosyn Indication: rule out sepsis  No Known Allergies  Patient Measurements:   Adjusted Body Weight:   Vital Signs: Temp: 100.1 F (37.8 C) (06/06 1913) Temp Source: Rectal (06/06 1913) BP: 90/39 mmHg (06/06 1913) Pulse Rate: 99 (06/06 1913) Intake/Output from previous day:   Intake/Output from this shift:    Labs:  Recent Labs  03/19/15 2008  WBC 11.1*  HGB 10.4*  PLT 165   CrCl cannot be calculated (Unknown ideal weight.). No results for input(s): VANCOTROUGH, VANCOPEAK, VANCORANDOM, GENTTROUGH, GENTPEAK, GENTRANDOM, TOBRATROUGH, TOBRAPEAK, TOBRARND, AMIKACINPEAK, AMIKACINTROU, AMIKACIN in the last 72 hours.   Microbiology: No results found for this or any previous visit (from the past 720 hour(s)).  Medical History: Past Medical History  Diagnosis Date  . Diabetes mellitus without complication   . Hypertension   . Hernia   . Back pain   . Seizures     Medications:   (Not in a hospital admission) Scheduled:   Infusions:  . sodium chloride 75 mL/hr at 03/19/15 2035  . piperacillin-tazobactam    . sodium chloride    . vancomycin     Assessment: 69yo male presents from group home following mechanical fall. Pharmacy is consulted to dose vancomycin and zosyn for suspected sepsis. Pt is febrile to 100.1, WBC 11.1, LA 5.1, sCr 5.44 (was 1.9 06/28/14).  Goal of Therapy:  Vancomycin trough level 15-20 mcg/ml  Plan:  Vancomycin 1g IV q48h Zosyn 3.375g IV followed by 2.25g q8h Measure antibiotic drug levels at steady state Follow up culture results, renal function, and clinical course  Andrey Cota. Diona Foley, PharmD Clinical Pharmacist Pager [phone] 03/19/2015,9:02 PM

## 2015-03-20 ENCOUNTER — Encounter (HOSPITAL_COMMUNITY): Payer: Self-pay | Admitting: Pulmonary Disease

## 2015-03-20 ENCOUNTER — Inpatient Hospital Stay (HOSPITAL_COMMUNITY): Admit: 2015-03-20 | Payer: Medicare HMO

## 2015-03-20 ENCOUNTER — Inpatient Hospital Stay (HOSPITAL_COMMUNITY): Payer: Medicare HMO

## 2015-03-20 ENCOUNTER — Encounter (HOSPITAL_COMMUNITY): Payer: Medicare HMO

## 2015-03-20 DIAGNOSIS — I9589 Other hypotension: Secondary | ICD-10-CM

## 2015-03-20 DIAGNOSIS — E119 Type 2 diabetes mellitus without complications: Secondary | ICD-10-CM

## 2015-03-20 DIAGNOSIS — Z452 Encounter for adjustment and management of vascular access device: Secondary | ICD-10-CM

## 2015-03-20 DIAGNOSIS — G9340 Encephalopathy, unspecified: Secondary | ICD-10-CM | POA: Diagnosis present

## 2015-03-20 DIAGNOSIS — W19XXXA Unspecified fall, initial encounter: Secondary | ICD-10-CM

## 2015-03-20 DIAGNOSIS — I509 Heart failure, unspecified: Secondary | ICD-10-CM

## 2015-03-20 LAB — HEPATIC FUNCTION PANEL
ALT: 11 U/L — ABNORMAL LOW (ref 17–63)
AST: 21 U/L (ref 15–41)
Albumin: 2.9 g/dL — ABNORMAL LOW (ref 3.5–5.0)
Alkaline Phosphatase: 64 U/L (ref 38–126)
Bilirubin, Direct: <0.1 mg/dL — ABNORMAL LOW (ref 0.1–0.5)
Total Bilirubin: 0.3 mg/dL (ref 0.3–1.2)
Total Protein: 5.5 g/dL — ABNORMAL LOW (ref 6.5–8.1)

## 2015-03-20 LAB — CBC WITH DIFFERENTIAL/PLATELET
Basophils Absolute: 0 10*3/uL (ref 0.0–0.1)
Basophils Relative: 0 % (ref 0–1)
Eosinophils Absolute: 0 10*3/uL (ref 0.0–0.7)
Eosinophils Relative: 0 % (ref 0–5)
HCT: 31.9 % — ABNORMAL LOW (ref 39.0–52.0)
Hemoglobin: 10.6 g/dL — ABNORMAL LOW (ref 13.0–17.0)
Lymphocytes Relative: 15 % (ref 12–46)
Lymphs Abs: 1.7 10*3/uL (ref 0.7–4.0)
MCH: 29 pg (ref 26.0–34.0)
MCHC: 33.2 g/dL (ref 30.0–36.0)
MCV: 87.4 fL (ref 78.0–100.0)
Monocytes Absolute: 1.1 10*3/uL — ABNORMAL HIGH (ref 0.1–1.0)
Monocytes Relative: 10 % (ref 3–12)
Neutro Abs: 8.3 10*3/uL — ABNORMAL HIGH (ref 1.7–7.7)
Neutrophils Relative %: 75 % (ref 43–77)
Platelets: 137 10*3/uL — ABNORMAL LOW (ref 150–400)
RBC: 3.65 MIL/uL — ABNORMAL LOW (ref 4.22–5.81)
RDW: 12.9 % (ref 11.5–15.5)
WBC: 11.1 10*3/uL — ABNORMAL HIGH (ref 4.0–10.5)

## 2015-03-20 LAB — RAPID URINE DRUG SCREEN, HOSP PERFORMED
Amphetamines: NOT DETECTED
Barbiturates: NOT DETECTED
Benzodiazepines: NOT DETECTED
Cocaine: NOT DETECTED
Opiates: POSITIVE — AB
Tetrahydrocannabinol: NOT DETECTED

## 2015-03-20 LAB — CBC
HCT: 31.7 % — ABNORMAL LOW (ref 39.0–52.0)
Hemoglobin: 10.7 g/dL — ABNORMAL LOW (ref 13.0–17.0)
MCH: 29.7 pg (ref 26.0–34.0)
MCHC: 33.8 g/dL (ref 30.0–36.0)
MCV: 88.1 fL (ref 78.0–100.0)
Platelets: 128 10*3/uL — ABNORMAL LOW (ref 150–400)
RBC: 3.6 MIL/uL — ABNORMAL LOW (ref 4.22–5.81)
RDW: 13.1 % (ref 11.5–15.5)
WBC: 12.1 10*3/uL — ABNORMAL HIGH (ref 4.0–10.5)

## 2015-03-20 LAB — BASIC METABOLIC PANEL
Anion gap: 10 (ref 5–15)
Anion gap: 10 (ref 5–15)
Anion gap: 12 (ref 5–15)
BUN: 66 mg/dL — ABNORMAL HIGH (ref 6–20)
BUN: 59 mg/dL — ABNORMAL HIGH (ref 6–20)
BUN: 54 mg/dL — ABNORMAL HIGH (ref 6–20)
CO2: 18 mmol/L — ABNORMAL LOW (ref 22–32)
CO2: 19 mmol/L — ABNORMAL LOW (ref 22–32)
CO2: 17 mmol/L — ABNORMAL LOW (ref 22–32)
Calcium: 7.4 mg/dL — ABNORMAL LOW (ref 8.9–10.3)
Calcium: 7.8 mg/dL — ABNORMAL LOW (ref 8.9–10.3)
Calcium: 7.7 mg/dL — ABNORMAL LOW (ref 8.9–10.3)
Chloride: 107 mmol/L (ref 101–111)
Chloride: 112 mmol/L — ABNORMAL HIGH (ref 101–111)
Chloride: 113 mmol/L — ABNORMAL HIGH (ref 101–111)
Creatinine, Ser: 4.26 mg/dL — ABNORMAL HIGH (ref 0.61–1.24)
Creatinine, Ser: 3.42 mg/dL — ABNORMAL HIGH (ref 0.61–1.24)
Creatinine, Ser: 3.18 mg/dL — ABNORMAL HIGH (ref 0.61–1.24)
GFR calc Af Amer: 15 mL/min — ABNORMAL LOW (ref >60)
GFR calc Af Amer: 19 mL/min — ABNORMAL LOW (ref >60)
GFR calc Af Amer: 21 mL/min — ABNORMAL LOW (ref >60)
GFR calc non Af Amer: 13 mL/min — ABNORMAL LOW (ref >60)
GFR calc non Af Amer: 17 mL/min — ABNORMAL LOW (ref >60)
GFR calc non Af Amer: 18 mL/min — ABNORMAL LOW (ref >60)
Glucose, Bld: 681 mg/dL (ref 65–99)
Glucose, Bld: 206 mg/dL — ABNORMAL HIGH (ref 65–99)
Glucose, Bld: 116 mg/dL — ABNORMAL HIGH (ref 65–99)
Potassium: 3.8 mmol/L (ref 3.5–5.1)
Potassium: 3.6 mmol/L (ref 3.5–5.1)
Potassium: 3.8 mmol/L (ref 3.5–5.1)
Sodium: 135 mmol/L (ref 135–145)
Sodium: 141 mmol/L (ref 135–145)
Sodium: 142 mmol/L (ref 135–145)

## 2015-03-20 LAB — PROTIME-INR
INR: 1.05 (ref 0.00–1.49)
Prothrombin Time: 13.9 seconds (ref 11.6–15.2)

## 2015-03-20 LAB — CBG MONITORING, ED
Glucose-Capillary: 114 mg/dL — ABNORMAL HIGH (ref 65–99)
Glucose-Capillary: 114 mg/dL — ABNORMAL HIGH (ref 65–99)
Glucose-Capillary: 141 mg/dL — ABNORMAL HIGH (ref 65–99)
Glucose-Capillary: 169 mg/dL — ABNORMAL HIGH (ref 65–99)
Glucose-Capillary: 169 mg/dL — ABNORMAL HIGH (ref 65–99)
Glucose-Capillary: 242 mg/dL — ABNORMAL HIGH (ref 65–99)
Glucose-Capillary: 316 mg/dL — ABNORMAL HIGH (ref 65–99)
Glucose-Capillary: 373 mg/dL — ABNORMAL HIGH (ref 65–99)
Glucose-Capillary: 461 mg/dL — ABNORMAL HIGH (ref 65–99)
Glucose-Capillary: 521 mg/dL — ABNORMAL HIGH (ref 65–99)
Glucose-Capillary: 97 mg/dL (ref 65–99)
Glucose-Capillary: >600 mg/dL (ref 65–99)
Glucose-Capillary: >600 mg/dL (ref 65–99)

## 2015-03-20 LAB — GLUCOSE, CAPILLARY
Glucose-Capillary: 121 mg/dL — ABNORMAL HIGH (ref 65–99)
Glucose-Capillary: 212 mg/dL — ABNORMAL HIGH (ref 65–99)

## 2015-03-20 LAB — I-STAT ARTERIAL BLOOD GAS, ED
Acid-base deficit: 11 mmol/L — ABNORMAL HIGH (ref 0.0–2.0)
Bicarbonate: 16.4 mEq/L — ABNORMAL LOW (ref 20.0–24.0)
O2 Saturation: 87 %
TCO2: 18 mmol/L (ref 0–100)
pCO2 arterial: 41.2 mmHg (ref 35.0–45.0)
pH, Arterial: 7.209 — ABNORMAL LOW (ref 7.350–7.450)
pO2, Arterial: 64 mmHg — ABNORMAL LOW (ref 80.0–100.0)

## 2015-03-20 LAB — TROPONIN I
Troponin I: 0.75 ng/mL (ref <0.031)
Troponin I: 0.77 ng/mL (ref <0.031)
Troponin I: 0.72 ng/mL (ref <0.031)

## 2015-03-20 LAB — BETA-HYDROXYBUTYRIC ACID: Beta-Hydroxybutyric Acid: 0.18 mmol/L (ref 0.05–0.27)

## 2015-03-20 LAB — MRSA PCR SCREENING: MRSA by PCR: NEGATIVE

## 2015-03-20 LAB — I-STAT CG4 LACTIC ACID, ED
Lactic Acid, Venous: 1.61 mmol/L (ref 0.5–2.0)
Lactic Acid, Venous: 1.06 mmol/L (ref 0.5–2.0)

## 2015-03-20 LAB — CREATININE, SERUM
Creatinine, Ser: 3 mg/dL — ABNORMAL HIGH (ref 0.61–1.24)
GFR calc Af Amer: 23 mL/min — ABNORMAL LOW (ref >60)
GFR calc non Af Amer: 20 mL/min — ABNORMAL LOW (ref >60)

## 2015-03-20 LAB — ALBUMIN: Albumin: 3 g/dL — ABNORMAL LOW (ref 3.5–5.0)

## 2015-03-20 MED ORDER — SODIUM CHLORIDE 0.9 % IV BOLUS (SEPSIS)
1000.0000 mL | Freq: Once | INTRAVENOUS | Status: AC
  Administered 2015-03-20: 1000 mL via INTRAVENOUS

## 2015-03-20 MED ORDER — SODIUM CHLORIDE 0.9 % IV SOLN: 1000.0000 mL | Freq: Once | INTRAVENOUS | Status: DC

## 2015-03-20 MED ORDER — SODIUM CHLORIDE 0.9 % IV BOLUS (SEPSIS): 1000.0000 mL | INTRAVENOUS | Status: DC

## 2015-03-20 MED ORDER — HEPARIN SODIUM (PORCINE) 5000 UNIT/ML IJ SOLN
5000.0000 [IU] | Freq: Three times a day (TID) | INTRAMUSCULAR | Status: DC
  Administered 2015-03-20 – 2015-03-22 (×7): 5000 [IU] via SUBCUTANEOUS
  Filled 2015-03-20 (×7): qty 1

## 2015-03-20 MED ORDER — INSULIN ASPART 100 UNIT/ML ~~LOC~~ SOLN
0.0000 [IU] | SUBCUTANEOUS | Status: DC
  Administered 2015-03-20 – 2015-03-21 (×2): 3 [IU] via SUBCUTANEOUS

## 2015-03-20 MED ORDER — STROKE: EARLY STAGES OF RECOVERY BOOK
Freq: Once | Status: AC
  Administered 2015-03-20: 16:00:00
  Filled 2015-03-20: qty 1

## 2015-03-20 MED ORDER — SODIUM CHLORIDE 0.9 % IV SOLN
1000.0000 mL | Freq: Once | INTRAVENOUS | Status: DC
Start: 2015-03-20 — End: 2015-03-20

## 2015-03-20 MED ORDER — DEXTROSE-NACL 5-0.45 % IV SOLN
INTRAVENOUS | Status: DC
  Administered 2015-03-20: 1000 mL via INTRAVENOUS
  Administered 2015-03-20: 19:00:00 via INTRAVENOUS

## 2015-03-20 MED ORDER — SODIUM CHLORIDE 0.9 % IV SOLN
INTRAVENOUS | Status: AC
  Administered 2015-03-20: 1000 mL via INTRAVENOUS

## 2015-03-20 MED ORDER — SODIUM CHLORIDE 0.9 % IV SOLN
INTRAVENOUS | Status: DC
  Filled 2015-03-20: qty 2.5

## 2015-03-20 MED ORDER — SENNOSIDES-DOCUSATE SODIUM 8.6-50 MG PO TABS
1.0000 | ORAL_TABLET | Freq: Every evening | ORAL | Status: DC | PRN
  Filled 2015-03-20: qty 1

## 2015-03-20 MED ORDER — SODIUM CHLORIDE 0.9 % IV SOLN
INTRAVENOUS | Status: DC
  Administered 2015-03-20 – 2015-03-21 (×3): via INTRAVENOUS

## 2015-03-20 MED ORDER — POTASSIUM CHLORIDE 10 MEQ/100ML IV SOLN
10.0000 meq | INTRAVENOUS | Status: AC
  Administered 2015-03-20 (×2): 10 meq via INTRAVENOUS
  Filled 2015-03-20 (×2): qty 100

## 2015-03-20 MED ORDER — ALBUMIN HUMAN 25 % IV SOLN
25.0000 g | Freq: Once | INTRAVENOUS | Status: AC
  Administered 2015-03-20: 25 g via INTRAVENOUS
  Filled 2015-03-20 (×2): qty 100

## 2015-03-20 MED ORDER — SODIUM CHLORIDE 0.9 % IV SOLN
2.0000 g | Freq: Once | INTRAVENOUS | Status: AC
  Administered 2015-03-20: 2 g via INTRAVENOUS
  Filled 2015-03-20: qty 20

## 2015-03-20 MED ORDER — INSULIN GLARGINE 100 UNIT/ML ~~LOC~~ SOLN
10.0000 [IU] | Freq: Once | SUBCUTANEOUS | Status: AC
  Administered 2015-03-20: 10 [IU] via SUBCUTANEOUS
  Filled 2015-03-20: qty 0.1

## 2015-03-20 MED ORDER — SODIUM CHLORIDE 0.9 % IV SOLN: 1000.0000 mL | INTRAVENOUS | Status: DC

## 2015-03-20 NOTE — ED Notes (Signed)
Pt voided 753ml urine in urinal

## 2015-03-20 NOTE — Consult Note (Signed)
NEURO HOSPITALIST CONSULT NOTE    Reason for Consult: Stroke  HPI:                                                                                                                                          [name] [name] is an 69 y.o. male with diabetes who was noted to be confused and lethargic.  Patient states he ran out of his insulin in his insulin pump and started using his sisters short acting insulin. On arrival to ED he was noted to have a temperature of 101, BG 1298, Cr 5.44, WBC 11.1.  Patient was hydrated and started on empiric Vancomycin and Zosyn for possible sepsis. UC and BC pending.  MRI was obtained while in ED due to continued confusion. 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 Neurology was consulted due to these findings.   Patient will be admitted to hospital for further evaluation of Sepsis (blood culture and urine cultures pending) and stroke work up.  Currently patient has no complaints.   Past Medical History  Diagnosis Date  . Diabetes mellitus without complication   . Hypertension   . Hernia   . Back pain   . Seizures     Past Surgical History  Procedure Laterality Date  . Hip arthroplasty Right 02/05/2013    Procedure: ARTHROPLASTY BIPOLAR HIP;  Surgeon: [name], MD;  Location: [facility];  Service: Orthopedics;  Laterality: Right;    Family History  Problem Relation Age of Onset  . Diabetes Mother   . Hyperlipidemia Father   . Hypertension Father      Social History:  reports that he quit smoking about 2 years ago. His smoking use included Cigarettes. He has a 7.5 pack-year smoking history. He has never used smokeless tobacco. He reports that he drinks alcohol. He reports that he does not use illicit drugs.  No Known Allergies  MEDICATIONS:                                                                                                                     Current Facility-Administered Medications   Medication Dose Route Frequency Provider Last Rate Last Dose  .  stroke: mapping our early stages of recovery book  Does not apply Once Allie Bossier, MD      . 0.9 %  sodium chloride infusion   Intravenous Continuous Lavina Hamman, MD   Stopped at 03/20/15 [phone]  . albumin human 25 % solution 25 g  25 g Intravenous Once Allie Bossier, MD      . calcium gluconate 2 g in sodium chloride 0.9 % 100 mL IVPB  2 g Intravenous Once Allie Bossier, MD 120 mL/hr at 03/20/15 1449 2 g at 03/20/15 1449  . dextrose 5 %-0.45 % sodium chloride infusion   Intravenous Continuous Lavina Hamman, MD 100 mL/hr at 03/20/15 0633 1,000 mL at 03/20/15 0633  . heparin injection 5,000 Units  5,000 Units Subcutaneous 3 times per day Lavina Hamman, MD   5,000 Units at 03/20/15 [phone]  . insulin regular (NOVOLIN R,HUMULIN R) 250 Units in sodium chloride 0.9 % 250 mL (1 Units/mL) infusion   Intravenous Continuous Lavina Hamman, MD 3.1 mL/hr at 03/20/15 1455 3.1 Units/hr at 03/20/15 1455  . piperacillin-tazobactam (ZOSYN) IVPB 2.25 g  2.25 g Intravenous Q8H Rebecka Apley,    Stopped at 03/20/15 [id number]  . senna-docusate (Senokot-S) tablet 1 tablet  1 tablet Oral QHS PRN Allie Bossier, MD      . Derrill Memo ON 03/21/2015] vancomycin (VANCOCIN) IVPB 1000 mg/200 mL premix  1,000 mg Intravenous Q48H Rebecka Apley, [facility]       Current Outpatient Prescriptions  Medication Sig Dispense Refill  . alprazolam (XANAX) 2 MG tablet Take 2 mg by mouth 2 (two) times daily as needed for anxiety.     Marland Kitchen FLUoxetine (PROZAC) 20 MG capsule Take 20 mg by mouth daily.    . insulin aspart protamine- aspart (NOVOLOG MIX 70/30) (70-30) 100 UNIT/ML injection Inject 0.1 mLs (10 Units total) into the skin 2 (two) times daily. 10 mL 11  . insulin glargine (LANTUS) 100 UNIT/ML injection Inject 0.15 mLs (15 Units total) into the skin at bedtime. 10 mL 11  . oxycodone (ROXICODONE) 30 MG immediate release tablet Take 30 mg by mouth every 4 (four) hours as needed for  pain.     . ramipril (ALTACE) 10 MG capsule Take 10 mg by mouth every morning.        ROS:                                                                                                                                       History obtained from the patient  General ROS: negative for - chills, fatigue, fever, night sweats, weight gain or weight loss Psychological ROS: negative for - behavioral disorder, hallucinations, memory difficulties, mood swings or suicidal ideation Ophthalmic ROS: negative for - blurry vision, double vision, eye pain or loss of vision ENT ROS: negative for - epistaxis, nasal discharge, oral lesions, sore throat, tinnitus or vertigo Allergy and Immunology ROS: negative for -  hives or itchy/watery eyes Hematological and Lymphatic ROS: negative for - bleeding problems, bruising or swollen lymph nodes Endocrine ROS: negative for - galactorrhea, hair pattern changes, polydipsia/polyuria or temperature intolerance Respiratory ROS: negative for - cough, hemoptysis, shortness of breath or wheezing Cardiovascular ROS: negative for - chest pain, dyspnea on exertion, edema or irregular heartbeat Gastrointestinal ROS: negative for - abdominal pain, diarrhea, hematemesis, nausea/vomiting or stool incontinence Genito-Urinary ROS: negative for - dysuria, hematuria, incontinence or urinary frequency/urgency Musculoskeletal ROS: negative for - joint swelling or muscular weakness Neurological ROS: as noted in HPI Dermatological ROS: negative for rash and skin lesion changes   Blood pressure 106/55, pulse 63, temperature 98.5 F (36.9 C), temperature source Oral, resp. rate 12, height 5\' 9"  (1.753 m), weight 71.215 kg (157 lb), SpO2 98 %.   Neurologic Examination:                                                                                                      HEENT-  Normocephalic, no lesions, without obvious abnormality.  Normal external eye and conjunctiva.  Normal TM's  bilaterally.  Normal auditory canals and external ears. Normal external nose, mucus membranes and septum.  Normal pharynx. Cardiovascular- S1, S2 normal, pulses palpable throughout   Lungs- chest clear, no wheezing, rales, normal symmetric air entry Abdomen- soft, non-tender; bowel sounds normal; no masses,  no organomegaly Extremities- no edema Lymph-no adenopathy palpable Musculoskeletal-no joint tenderness, deformity or swelling Skin-warm and dry, no hyperpigmentation, vitiligo, or suspicious lesions  Neurological Examination Mental Status: Alert, oriented, thought content appropriate.  Speech fluent without evidence of aphasia.  Able to follow 3 step commands without difficulty. Cranial Nerves: II: Discs flat bilaterally; Visual fields grossly normal, pupils equal, round, reactive to light and accommodation III,IV, VI: ptosis not present, extra-ocular motions intact bilaterally V,VII: smile symmetric, facial light touch sensation normal bilaterally VIII: hearing normal bilaterally IX,X: uvula rises symmetrically XI: bilateral shoulder shrug XII: midline tongue extension Motor: Right : Upper extremity   5/5    Left:     Upper extremity   5/5  Lower extremity   5/5     Lower extremity   5/5 Tone and bulk:normal tone throughout; no atrophy noted Sensory: Pinprick and light touch intact throughout, bilaterally Deep Tendon Reflexes: 2+ and symmetric throughout Plantars: Right: downgoing   Left: downgoing Cerebellar: normal finger-to-nose and normal heel-to-shin test on the left with dysmetria noted on the right.  Gait: not tested due to multiple leads.     Lab Results: Basic Metabolic Panel:  Recent Labs Lab 03/19/15 2008 03/20/15 0148 03/20/15 0740 03/20/15 1000 03/20/15 1331  NA 127* 135 141 142  --   K 4.6 3.8 3.6 3.8  --   CL 93* 107 112* 113*  --   CO2 10* 18* 19* 17*  --   GLUCOSE 1298* 681* 206* 116*  --   BUN 78* 66* 59* 54*  --   CREATININE 5.44* 4.26* 3.42*  3.18* 3.00*  CALCIUM 8.2* 7.4* 7.8* 7.7*  --     Liver Function Tests:  Recent Labs  Lab 03/19/15 2008 03/20/15 0740 03/20/15 1000  AST 25 21  --   ALT 11* 11*  --   ALKPHOS 86 64  --   BILITOT 1.3* 0.3  --   PROT 6.2* 5.5*  --   ALBUMIN 3.5 2.9* 3.0*   No results for input(s): LIPASE, AMYLASE in the last 168 hours. No results for input(s): AMMONIA in the last 168 hours.  CBC:  Recent Labs Lab 03/19/15 2008 03/20/15 0740 03/20/15 1331  WBC 11.1* 11.1* 12.1*  NEUTROABS 9.4* 8.3*  --   HGB 10.4* 10.6* 10.7*  HCT 35.6* 31.9* 31.7*  MCV 100.6* 87.4 88.1  PLT 165 137* 128*    Cardiac Enzymes:  Recent Labs Lab 03/20/15 1000  TROPONINI 0.75*    Lipid Panel: No results for input(s): CHOL, TRIG, HDL, CHOLHDL, VLDL, LDLCALC in the last 168 hours.  CBG:  Recent Labs Lab 03/20/15 0849 03/20/15 1002 03/20/15 1235 03/20/15 1345 03/20/15 1448  GLUCAP 114* 97 114* 141* 169*    Microbiology: Results for orders placed or performed during the hospital encounter of 03/19/14  Culture, blood (routine x 2)     Status: None   Collection Time: 03/19/14  7:40 PM  Result Value Ref Range Status   Specimen Description BLOOD LEFT ARM  Final   Special Requests BOTTLES DRAWN AEROBIC AND ANAEROBIC 4CC  Final   Culture  Setup Time   Final    03/20/2014 01:51 Performed at [facility]   Final    NO GROWTH 5 DAYS Performed at [facility]   Report Status 03/26/2014 FINAL  Final  Culture, blood (routine x 2)     Status: None   Collection Time: 03/19/14  7:55 PM  Result Value Ref Range Status   Specimen Description BLOOD LEFT HAND  Final   Special Requests BOTTLES DRAWN AEROBIC AND ANAEROBIC 5CC  Final   Culture  Setup Time   Final    03/20/2014 01:51 Performed at [place]   Final    NO GROWTH 5 DAYS Performed at [facility]   Report Status 03/26/2014 FINAL  Final  MRSA PCR Screening     Status: None   Collection  Time: 03/19/14  9:59 PM  Result Value Ref Range Status   MRSA by PCR NEGATIVE NEGATIVE Final    Comment:        The GeneXpert MRSA Assay (FDA approved for NASAL specimens only), is one component of a comprehensive MRSA colonization surveillance program. It is not intended to diagnose MRSA infection nor to guide or monitor treatment for MRSA infections.    Coagulation Studies:  Recent Labs  03/20/15 1331  LABPROT 13.9  INR 1.05    Imaging: Dg Lumbar Spine Complete  03/19/2015   CLINICAL DATA:  Altered mental status. [place] earlier today in his assisted living home.  EXAM: LUMBAR SPINE - COMPLETE 4+ VIEW  COMPARISON:  None.  FINDINGS: There is no evidence of lumbar spine fracture. Alignment is normal. Degenerative changes at L4-5 and L5-S1 chronic, with disc space narrowing, and slight facet mediated slip at L4-5. No worrisome osseous lesions. New RIGHT hip replacement appears uncomplicated.  IMPRESSION: Degenerative disc disease.  No acute abnormality.   Electronically Signed   By: Rolla Flatten M.D.   On: 03/19/2015 23:33   Ct Head Wo Contrast  03/19/2015   CLINICAL DATA:  Recent fall, altered mental status  EXAM: CT HEAD WITHOUT CONTRAST  TECHNIQUE: Contiguous  axial images were obtained from the base of the skull through the vertex without intravenous contrast.  COMPARISON:  03/19/2014  FINDINGS: Bony calvarium is intact. The paranasal sinuses and mastoid air cells are well aerated. Mild atrophic changes are seen. No findings to suggest acute hemorrhage, acute infarction or space-occupying mass lesion are noted.  IMPRESSION: Chronic atrophic changes without acute abnormality.   Electronically Signed   By: Inez Catalina M.D.   On: 03/19/2015 22:00   Mr Brain Wo Contrast  03/20/2015   CLINICAL DATA:  Initial evaluation for acute encephalopathy.  EXAM: MRI HEAD WITHOUT CONTRAST  TECHNIQUE: Multiplanar, multiecho pulse sequences of the brain and surrounding structures were obtained without  intravenous contrast.  COMPARISON:  Prior CT from 03/19/2015.  FINDINGS: Diffuse prominence of the CSF containing spaces is compatible with generalized age-related atrophy. Patchy and confluent T2/FLAIR hyperintensity within the periventricular and deep white matter both cerebral hemispheres most consistent with chronic small vessel ischemic disease, fairly mild for patient age. Similar changes seen within the pons.  There is a small 1 cm focus of restricted diffusion within the lateral aspect of the right thalamus, consistent 4 an acute ischemic infarct (series 3, image 23). Corresponding signal dropout seen on ADC map. No significant mass effect. No associated hemorrhage. No other infarct identified.  No mass lesion, mass effect, or midline shift. No hydrocephalus. No extra-axial fluid collection.  Craniocervical junction within normal limits. Reversal of the normal cervical lordosis with advanced degenerative disc disease present within the partially visualized upper cervical spine. There is associated mild to moderate canal stenosis.  Incidental note made of a partially empty sella.  No acute abnormality about the orbits.  Mild mucosal thickening present within the left maxillary sinus. Small amount of layering opacity present within the right sphenoid sinus. Paranasal sinuses are otherwise clear. No mastoid effusion.  Inner ear structures normal.  Bone marrow signal intensity within normal limits. No scalp soft tissue abnormality.  IMPRESSION: 1. Subtle 1 cm acute ischemic infarct within the right thalamus. No associated hemorrhage or mass effect. 2. No other acute intracranial process. 3. Atrophy with mild chronic small vessel ischemic disease. 4. 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 Finding is incompletely evaluated on this exam. This could be further characterized with dedicated MRI of the cervical spine as clinically desired.    Electronically Signed   By: Jeannine Boga M.D.   On: 03/20/2015 05:41   US Renal  03/20/2015   CLINICAL DATA:  Acute renal injury  EXAM: RENAL / URINARY TRACT ULTRASOUND COMPLETE  COMPARISON:  None.  FINDINGS: Right Kidney:  Length: 11.4 cm. Echogenicity within normal limits. No mass or hydronephrosis visualized.  Left Kidney:  Length: 11.7 Cm. A few small cysts are noted. The largest of these measures 9 mm in greatest dimension. No mass lesion or hydronephrosis is noted.  Bladder:  Appears normal for degree of bladder distention.  IMPRESSION: Left renal cysts.  No acute abnormality noted.   Electronically Signed   By: Inez Catalina M.D.   On: 03/20/2015 09:10   Dg Chest Port 1 View  03/20/2015   CLINICAL DATA:  69 year old male status post central line placement. Initial encounter.  EXAM: PORTABLE CHEST - 1 VIEW  COMPARISON:  1328 hr today and earlier.  FINDINGS: Portable AP semi upright view at 1421 hrs. New left IJ approach central venous catheter. The tip projects just above the level of the carina, however has  a curved configuration distally. No pneumothorax. Mediastinal contours remain normal. Stable ventilation.  IMPRESSION: Left IJ approach central line placed with tip at the level of the SVC. Curved configuration of the tip might indicate extension into the proximal azygos vein. A lateral view of the chest should confirm. Otherwise no adverse features.   Electronically Signed   By: Genevie Ann M.D.   On: 03/20/2015 14:30   Dg Chest Port 1 View  03/20/2015   CLINICAL DATA:  Status post attempted central line placement  EXAM: PORTABLE CHEST - 1 VIEW  COMPARISON:  03/19/2015  FINDINGS: Cardiac shadow is stable. The lungs are well aerated bilaterally. No pneumothorax is seen. No pleural effusion is noted. The bony structures are within normal limits.  IMPRESSION: No evidence of pneumothorax or pleural effusion.   Electronically Signed   By: Inez Catalina M.D.   On: 03/20/2015 13:57   Dg Chest Port 1  View  03/19/2015   CLINICAL DATA:  Fever for 1 day  EXAM: PORTABLE CHEST - 1 VIEW  COMPARISON:  03/19/2014  FINDINGS: The heart size and mediastinal contours are within normal limits. Both lungs are clear. The visualized skeletal structures are unremarkable.  IMPRESSION: No active disease.   Electronically Signed   By: Kathreen Devoid   On: 03/19/2015 19:40    Etta Quill PA-C [facility] [phone]  03/20/2015, 3:23 PM   Patient seen and examined.  Clinical course and management discussed.  Necessary edits performed.  I agree with the above.  Assessment and plan of care developed and discussed below.     Assessment/Plan: 69 year old male presenting in DKA.  Had complaints of confusion and lethargy as well.  MRI of the brain performed and personally reviewed.  MRI shows a small acute left thalamic infarct.  Patient with multiple risk factors, poorly controlled.  Further work up recommended.  Patient on no antiplatelet therapy at home.    Recommendations: 1. HgbA1c, fasting lipid panel 2. Frequent neuro checks 3. PT consult, OT consult, Speech consult 4. Echocardiogram 5. Carotid dopplers 6. Prophylactic therapy-Antiplatelet med: Aspirin - dose 325mg  daily 7. NPO until RN stroke swallow screen 8. Telemetry monitoring    Alexis Goodell, MD [facility] [phone]  03/20/2015  3:32 PM

## 2015-03-20 NOTE — ED Notes (Signed)
Philis Pique MD at bedside. Verbal order for CG4 lactic instead of plasma as well as NS Bolus.

## 2015-03-20 NOTE — ED Notes (Signed)
Spoke with pt sister, wanted update on pt. Sister stated daught

## 2015-03-20 NOTE — Progress Notes (Signed)
Echocardiogram 2D Echocardiogram has been performed.  Tresa Res 03/20/2015, 10:42 AM

## 2015-03-20 NOTE — ED Notes (Signed)
Philis Pique MD called back. Pt to receive 1L NS D/T low BP. Wants lactic ordered as well.

## 2015-03-20 NOTE — Progress Notes (Signed)
Pt worried of clothes miss and wallet with is money. Sister in to visit and she stated that she took them home last night.

## 2015-03-20 NOTE — ED Notes (Signed)
Admitting MD at bedside.

## 2015-03-20 NOTE — ED Notes (Signed)
MD unable to obtain Central line access

## 2015-03-20 NOTE — ED Notes (Signed)
Lab unable to obtain blood due to swelling of extremities; Pt has not urinated in a few hours; admitting Md notified; bladder scan will be done to assess urine status

## 2015-03-20 NOTE — ED Notes (Signed)
Sherral Hammers, MD at bedside attempting central line access

## 2015-03-20 NOTE — ED Notes (Signed)
MRI notified re: plan to scan the pt, MRI to contact this RN when they can scan the pt for his MRA

## 2015-03-20 NOTE — ED Notes (Signed)
Sherral Hammers, MD  Notified re: need for additional insulin coverage post insulin infusion, Pt to receive central line d/t decreasing BP

## 2015-03-20 NOTE — Procedures (Signed)
Patient was hypotensive, DKA, septic, limited IV access. After explaining the risks and benefits of a central venous line to [name] he agreed to allow placement of said line. The site was located using ultrasound. A timeout was conducted to verify the site, and using  sterile technique a right IJ was attempted. After receiving good flash back attempted to thread the line 3 times each time the line hitting resistance. At that point the attempt at placing the right IJ was discontinued. Pressure was held against the site and good hemostasis was obtained. [name] agreed to allow [place] to try to obtain central access at one other site. Again using sterile technique attempted to place a left subclavian line. Received initially a good flashback again attempted to thread the line which met resistance at which point all attempts at obtaining central access were discontinued. A stat PCXR was obtained which showed no pneumo/hemothorax. Contacted PCCM and requested that they attempt placement of central venous line.  Procedure started at approximately 1000 and was terminated at approximately 1130. 90 minutes of critical care time were used.

## 2015-03-20 NOTE — ED Notes (Signed)
Calcium Gluconate and D5 NS 0.45% has not been tested, CC in at bedside to attempt central line placement

## 2015-03-20 NOTE — Progress Notes (Signed)
[facility] TEAM 1 - Stepdown/ICU TEAM Progress Note  [name] [name] [id number] DOB: [birth year] DOA: 03/19/2015 PCP: Guadlupe Spanish, MD  Admit HPI / Brief Narrative: [name] [name] is a 69 y.o. male with PMHx diabetes mellitus Type 2 uncontrolled , chronic kidney disease, hypertension, chronic back pain. The patient is presenting with complaints of generalized weakness and lethargy that started today. The history was obtained from patient's sister with whom the patient has been living since last one month. The patient was at his baseline earlier in the morning as per patient's sister and when she left at 11:00 he was sitting in the recliner. When she comes back from the work patient reports a fall during the day and has been confused and lethargic. No fever no chills no nausea no vomiting no diarrhea reported. Patient has ran out of his insulin for last 2 days and is currently using his sister's short-acting insulin which she uses for her insulin pump. Patient has also ran out of his other medications. Patient has been drowsy and lethargic but denies any complaints of headache, dizziness, vision changes, chest pain, abdominal pain, any other focal deficit.  The patient is coming from home. And at his baseline independent for most of his ADL.  HPI/Subjective: 6/7 A/O x 4 states lives at home with Mother who is 101yo with mild Alzheimer Dementia. States has hed DM for 20 + yrs and this is only 2d time hospitalized for DKA   Assessment/Plan: DKA (diabetic ketoacidoses) -Severe dehydration secondary to DKA -Continue hydration with normal saline 16ml/hr -Continue hydration with D5W-0.45 saline 75 ml/hr -CT of the head is negative. -MRI head shows acute stroke -Lumbar spine as well as chest x-ray were also negative.  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 -Neurology has been consulted -When more stable PT/OT consult  Aute  Encephalopathy/.Possible sepsis.? -Most likely multifactorial to include DKA, acute stroke, hypotension, and uremia. - Pt's BP have been extremely soft. SBP goal 145   - Blood culture and Urine culture pending   Hypocalcemia -6/7Corrected calcium =8.8 -Calcium gluconate 2 gm IV  Hypotension -See hypocalcemia -Albumin 25 gm  Acute on chronic kidney disease. - That is secondary to DKA and dehydration. Improving with hydration  -Renal ultrasound pending. - After fluid resuscitated if Cr still elevated consider consult in nephrology  Fall. -Most likely multifactorial to include DKA, acute stroke, hypotension, and uremia. -Patient does not appear to have any acute fracture. -Continue fall precaution.    Code Status: FULL Family Communication: no family present at time of exam Disposition Plan: Resolution stroke    Consultants: Neurology   Procedure/Significant Events: 6/7 L-spine x-ray; DDD, DJD 6/7 CT head without contrast; no acute mallet 6/7 MRI brain without contrast;. Subtle 1 cm acute ischemic infarct within the right thalamus.  6/7 Echocardiogram:Left ventricle: LVEF=60% to 65%.  - Right ventricle: mildly dilated - Tricuspid valve: There was moderate regurgitation.   Culture   Antibiotics: Zosyn 6/6>> Vancomycin 6/6>>  DVT prophylaxis: Heparin SQ   Devices    LINES / TUBES:  Left IJ CVL    Continuous Infusions: . sodium chloride Stopped (03/20/15 [id number])  . dextrose 5 % and 0.45% NaCl 75 mL/hr at 03/20/15 1857  . insulin (NOVOLIN-R) infusion Stopped (03/20/15 1652)    Objective: VITAL SIGNS: Temp: 98.4 F (36.9 C) (06/07 1946) Temp Source: Oral (06/07 1946) BP: 159/61 mmHg (06/07 1946) Pulse Rate: 60 (06/07 1946) SPO2; FIO2:  Intake/Output Summary (Last 24 hours) at 03/20/15 1953 Last data filed at 03/20/15 1950  Gross per 24 hour  Intake 7051.18 ml  Output   1200 ml  Net 5851.18 ml     Exam: General: A/O 4, NAD, No acute  respiratory distress Eyes: Negative headache, eye pain, double vision,  retinal hemorrhage ENT: Negative Runny nose, negative gingival bleeding Neck:  Negative scars, masses, torticollis, lymphadenopathy, JVD Lungs: Clear to auscultation bilaterally without wheezes or crackles Cardiovascular: Tachycardic, Regular rhythm without murmur gallop or rub normal S1 and S2 Abdomen:negative abdominal pain, negative dysphagia, Nontender, nondistended, soft, bowel sounds positive, no rebound, no ascites, no appreciable mass Extremities: No significant cyanosis, clubbing, or edema bilateral lower extremities Psychiatric:  Negative depression, negative anxiety, negative fatigue, negative mania  Neurologic:  Cranial nerves II through XII intact, tongue/uvula midline, all extremities muscle strength 5/5, sensation intact throughout, finger nose finger bilateral within normal limits, quick finger touch bilateral within normal limits, negative dysarthria, negative expressive aphasia, negative receptive aphasia.    Data Reviewed: Basic Metabolic Panel:  Recent Labs Lab 03/19/15 2008 03/20/15 0148 03/20/15 0740 03/20/15 1000 03/20/15 1331  NA 127* 135 141 142  --   K 4.6 3.8 3.6 3.8  --   CL 93* 107 112* 113*  --   CO2 10* 18* 19* 17*  --   GLUCOSE 1298* 681* 206* 116*  --   BUN 78* 66* 59* 54*  --   CREATININE 5.44* 4.26* 3.42* 3.18* 3.00*  CALCIUM 8.2* 7.4* 7.8* 7.7*  --    Liver Function Tests:  Recent Labs Lab 03/19/15 2008 03/20/15 0740 03/20/15 1000  AST 25 21  --   ALT 11* 11*  --   ALKPHOS 86 64  --   BILITOT 1.3* 0.3  --   PROT 6.2* 5.5*  --   ALBUMIN 3.5 2.9* 3.0*   No results for input(s): LIPASE, AMYLASE in the last 168 hours. No results for input(s): AMMONIA in the last 168 hours. CBC:  Recent Labs Lab 03/19/15 2008 03/20/15 0740 03/20/15 1331  WBC 11.1* 11.1* 12.1*  NEUTROABS 9.4* 8.3*  --   HGB 10.4* 10.6* 10.7*  HCT 35.6* 31.9* 31.7*  MCV 100.6* 87.4 88.1  PLT  165 137* 128*   Cardiac Enzymes:  Recent Labs Lab 03/20/15 1000 03/20/15 1550  TROPONINI 0.75* 0.77*   BNP (last 3 results) No results for input(s): BNP in the last 8760 hours.  ProBNP (last 3 results) No results for input(s): PROBNP in the last 8760 hours.  CBG:  Recent Labs Lab 03/20/15 1002 03/20/15 1235 03/20/15 1345 03/20/15 1448 03/20/15 1652  GLUCAP 97 114* 141* 169* 121*    Recent Results (from the past 240 hour(s))  MRSA PCR Screening     Status: None   Collection Time: 03/20/15  3:35 PM  Result Value Ref Range Status   MRSA by PCR NEGATIVE NEGATIVE Final    Comment:        The GeneXpert MRSA Assay (FDA approved for NASAL specimens only), is one component of a comprehensive MRSA colonization surveillance program. It is not intended to diagnose MRSA infection nor to guide or monitor treatment for MRSA infections.      Studies:  Recent x-ray studies have been reviewed in detail by the Attending Physician  Scheduled Meds:  Scheduled Meds: . heparin  5,000 Units Subcutaneous 3 times per day  . insulin aspart  0-9 Units Subcutaneous 6 times per day  . piperacillin-tazobactam (ZOSYN)  IV  2.25 g Intravenous Q8H  . [START ON 03/21/2015] vancomycin  1,000 mg Intravenous Q48H    Time spent on care of this patient: 40 mins   WOODS, Geraldo Docker , MD  [facility] Office  [phone] Pager - [phone]  On-Call/Text Page:      [name] [name]      password [email or web address]  If 7PM-7AM, please contact night-coverage [email or web address] Password [email or web address] 03/20/2015, 7:53 PM   LOS: 1 day   Care during the described time interval was provided by me .  I have reviewed this patient's available data, including medical history, events of note, physical examination, and all test results as part of my evaluation. I have personally reviewed and interpreted all radiology studies.   Dia Crawford, MD [phone] Pager

## 2015-03-20 NOTE — ED Notes (Signed)
CBG >600 °

## 2015-03-20 NOTE — Procedures (Signed)
Central Venous Catheter Insertion Procedure Note Arthas Vasil [id number] April 09, 1946  Procedure: Insertion of Central Venous Catheter Indications: Assessment of intravascular volume, Drug and/or fluid administration and Frequent blood sampling  Procedure Details Consent: Risks of procedure as well as the alternatives and risks of each were explained to the (patient/caregiver).  Consent for procedure obtained.   Time Out: Verified patient identification, verified procedure, site/side was marked, verified correct patient position, special equipment/implants available, medications/allergies/relevent history reviewed, required imaging and test results available.  Performed  Maximum sterile technique was used including antiseptics, cap, gloves, gown, hand hygiene, mask and sheet. Skin prep: Chlorhexidine; local anesthetic administered  A triple lumen catheter was placed in the left internal jugular vein to 22 cm, sutured at hub using the Seldinger technique.  Evaluation Blood flow good Complications: No apparent complications Patient did tolerate procedure well. Chest X-ray ordered to verify placement.  CXR: pending.  Procedure performed under direct ultrasound guidance for real time vessel cannulation.      Montey Hora, [facility] Pager: [phone]  or [phone] 03/20/2015, 2:13 PM

## 2015-03-20 NOTE — ED Notes (Signed)
Dr. Posey Pronto in to assess pt for admission

## 2015-03-21 ENCOUNTER — Inpatient Hospital Stay (HOSPITAL_COMMUNITY): Payer: Medicare HMO

## 2015-03-21 DIAGNOSIS — N189 Chronic kidney disease, unspecified: Secondary | ICD-10-CM

## 2015-03-21 DIAGNOSIS — I639 Cerebral infarction, unspecified: Principal | ICD-10-CM

## 2015-03-21 LAB — URINE CULTURE
Colony Count: NO GROWTH
Culture: NO GROWTH

## 2015-03-21 LAB — BASIC METABOLIC PANEL
Anion gap: 10 (ref 5–15)
BUN: 36 mg/dL — ABNORMAL HIGH (ref 6–20)
CO2: 19 mmol/L — ABNORMAL LOW (ref 22–32)
Calcium: 8 mg/dL — ABNORMAL LOW (ref 8.9–10.3)
Chloride: 111 mmol/L (ref 101–111)
Creatinine, Ser: 1.97 mg/dL — ABNORMAL HIGH (ref 0.61–1.24)
GFR calc Af Amer: 38 mL/min — ABNORMAL LOW (ref >60)
GFR calc non Af Amer: 33 mL/min — ABNORMAL LOW (ref >60)
Glucose, Bld: 161 mg/dL — ABNORMAL HIGH (ref 65–99)
Potassium: 4 mmol/L (ref 3.5–5.1)
Sodium: 140 mmol/L (ref 135–145)

## 2015-03-21 LAB — LIPID PANEL
Cholesterol: 148 mg/dL (ref 0–200)
HDL: 43 mg/dL (ref >40)
LDL Cholesterol: 77 mg/dL (ref 0–99)
Total CHOL/HDL Ratio: 3.4 RATIO
Triglycerides: 139 mg/dL (ref <150)
VLDL: 28 mg/dL (ref 0–40)

## 2015-03-21 LAB — GLUCOSE, CAPILLARY
Glucose-Capillary: 134 mg/dL — ABNORMAL HIGH (ref 65–99)
Glucose-Capillary: 189 mg/dL — ABNORMAL HIGH (ref 65–99)
Glucose-Capillary: 226 mg/dL — ABNORMAL HIGH (ref 65–99)
Glucose-Capillary: 230 mg/dL — ABNORMAL HIGH (ref 65–99)
Glucose-Capillary: 260 mg/dL — ABNORMAL HIGH (ref 65–99)
Glucose-Capillary: 279 mg/dL — ABNORMAL HIGH (ref 65–99)

## 2015-03-21 MED ORDER — ATORVASTATIN CALCIUM 10 MG PO TABS
10.0000 mg | ORAL_TABLET | Freq: Every day | ORAL | Status: DC
  Administered 2015-03-21 – 2015-03-22 (×2): 10 mg via ORAL
  Filled 2015-03-21 (×3): qty 1

## 2015-03-21 MED ORDER — ALPRAZOLAM 0.5 MG PO TABS
1.0000 mg | ORAL_TABLET | Freq: Two times a day (BID) | ORAL | Status: DC | PRN
Start: 2015-03-21 — End: 2015-03-23

## 2015-03-21 MED ORDER — INSULIN ASPART PROT & ASPART (70-30 MIX) 100 UNIT/ML ~~LOC~~ SUSP: 10.0000 [IU] | Freq: Two times a day (BID) | SUBCUTANEOUS | Status: DC

## 2015-03-21 MED ORDER — OXYCODONE HCL 5 MG PO TABS
30.0000 mg | ORAL_TABLET | Freq: Four times a day (QID) | ORAL | Status: DC | PRN
  Administered 2015-03-21 – 2015-03-23 (×7): 30 mg via ORAL
  Filled 2015-03-21 (×7): qty 6

## 2015-03-21 MED ORDER — INSULIN ASPART 100 UNIT/ML ~~LOC~~ SOLN
0.0000 [IU] | Freq: Three times a day (TID) | SUBCUTANEOUS | Status: DC
  Administered 2015-03-21: 5 [IU] via SUBCUTANEOUS
  Administered 2015-03-21: 2 [IU] via SUBCUTANEOUS
  Administered 2015-03-21: 5 [IU] via SUBCUTANEOUS
  Administered 2015-03-22 (×2): 1 [IU] via SUBCUTANEOUS
  Administered 2015-03-23: 7 [IU] via SUBCUTANEOUS
  Administered 2015-03-23: 2 [IU] via SUBCUTANEOUS

## 2015-03-21 MED ORDER — ASPIRIN EC 81 MG PO TBEC
81.0000 mg | DELAYED_RELEASE_TABLET | Freq: Every day | ORAL | Status: DC
  Administered 2015-03-21 – 2015-03-23 (×3): 81 mg via ORAL
  Filled 2015-03-21 (×3): qty 1

## 2015-03-21 MED ORDER — FLUOXETINE HCL 20 MG PO CAPS
20.0000 mg | ORAL_CAPSULE | Freq: Every day | ORAL | Status: DC
  Administered 2015-03-21 – 2015-03-23 (×3): 20 mg via ORAL
  Filled 2015-03-21 (×3): qty 1

## 2015-03-21 MED ORDER — OXYCODONE HCL 5 MG PO TABS: 30.0000 mg | ORAL_TABLET | ORAL | Status: DC | PRN

## 2015-03-21 MED ORDER — INSULIN ASPART PROT & ASPART (70-30 MIX) 100 UNIT/ML ~~LOC~~ SUSP
10.0000 [IU] | Freq: Two times a day (BID) | SUBCUTANEOUS | Status: DC
Start: 2015-03-21 — End: 2015-03-23
  Administered 2015-03-21 – 2015-03-23 (×4): 10 [IU] via SUBCUTANEOUS
  Filled 2015-03-21 (×2): qty 10

## 2015-03-21 MED ORDER — PIPERACILLIN-TAZOBACTAM 3.375 G IVPB
3.3750 g | Freq: Three times a day (TID) | INTRAVENOUS | Status: DC
  Filled 2015-03-21 (×2): qty 50

## 2015-03-21 MED ORDER — VANCOMYCIN HCL IN DEXTROSE 1-5 GM/200ML-% IV SOLN
1000.0000 mg | INTRAVENOUS | Status: DC
  Filled 2015-03-21: qty 200

## 2015-03-21 MED ORDER — OXYCODONE HCL 5 MG PO TABS
10.0000 mg | ORAL_TABLET | Freq: Four times a day (QID) | ORAL | Status: DC | PRN
  Administered 2015-03-21: 10 mg via ORAL
  Filled 2015-03-21: qty 2

## 2015-03-21 NOTE — Progress Notes (Signed)
[facility] TEAM 1 - Stepdown/ICU TEAM Progress Note  [name] [name] [id number] DOB: Apr 14, 1946 DOA: 03/19/2015 PCP: Guadlupe Spanish, MD  Admit HPI / Brief Narrative: 69 y.o. male with Hx uncontrolled DM2, chronic kidney disease, hypertension, and chronic back pain who presented with complaints of generalized weakness and lethargy for 24hrs.  Patient ran out of his insulin 2 days prior and had been using his sister's short-acting insulin which she uses for her insulin pump.  HPI/Subjective: The patient is resting comfortably in bed.  He denies chest pain fevers chills nausea vomiting or abdominal pain.  He directs my attention to his thickened nails.  I have suggested that he visit a podiatrist for nail trimming after his discharge.  Assessment/Plan:  DKA in uncontrolled DM2  -CBG remains quite variable, but pt is now out of DKA - bicarb remains low due to acute renal failure, but has improved markedly - patient appears to be on 70/30 and Lantus insulin at home - I will simplify his regimen by transitioning to 70/30 only  1 cm acute ischemic infarct in the right thalamus -noted on MRI - findings most c/w small vessel source  -Neurology has been consulted - workup ongoing -PT/OT consults -LDL 77  Aute Encephalopathy / SIRS (not sepsis) -multifactorial to include DKA, acute stroke, hypotension, and uremia -no evidence of acute infection, therefore abx being d/c  -mental status has returned to baseline w/ correction of above issues   Hypocalcemia -corrected calcium 8.8 - follow w/o tx today in setting of aggressive volume expansion   Hypotension -resolved w/ volume resuscitation - most c/w DH due to DKA / osmotic diuresis   Acute on chronic kidney disease stage 3 -secondary to DKA and dehydration - rapidly improving w/ volume expansion  -Renal ultrasound w/o acute findings  -baseline crt ~1.5 / GFR 50  Fall -Most likely multifactorial to include DKA, acute stroke, hypotension, and  uremia -Patient does not appear to have an acute fracture or c/o focal pain  -PT/OT to see   Mildly elevated troponin -The patient is asymptomatic and the troponin has remained flat most consistent with acute renal failure and hypotension related myocardial stress  Code Status: FULL Family Communication: no family present at time of exam Disposition Plan: transfer to tele neuro bed - follow CBG - follow up CVA w/u - possible d/c next 24-48hrs   Consultants: Neurology  Procedure/Significant Events: 6/7 L-spine x-ray DDD, DJD 6/7 CT head without contrast no acute abnormality  6/7 MRI brain without contrast Subtle 1 cm acute ischemic infarct within the right thalamus 6/7 Echocardiogram:Left ventricle EF=60% to 65% - Right ventricle: mildly dilated - Tricuspid valve: moderate regurgitation  Antibiotics: Zosyn 6/6 > 6/7 Vancomycin 6/6   DVT prophylaxis: Heparin SQ  Objective: Blood pressure 164/76, pulse 61, temperature 98.6 F (37 C), temperature source Oral, resp. rate 23, height 5\' 9"  (1.753 m), weight 71.7 kg (158 lb 1.1 oz), SpO2 98 %.  Intake/Output Summary (Last 24 hours) at 03/21/15 1104 Last data filed at 03/21/15 1000  Gross per 24 hour  Intake 1773.45 ml  Output   1125 ml  Net 648.45 ml   Exam: General: No acute respiratory distress - alert and conversant  Lungs: Clear to auscultation bilaterally without wheezes or crackles Cardiovascular: Regular rate and rhythm without murmur gallop or rub normal S1 and S2 Abdomen: Nontender, nondistended, soft, bowel sounds positive, no rebound, no ascites, no appreciable mass Extremities: No significant cyanosis, clubbing, or edema bilateral lower extremities - nails B  feed c/w onychomycosis   Data Reviewed: Basic Metabolic Panel:  Recent Labs Lab 03/19/15 2008 03/20/15 0148 03/20/15 0740 03/20/15 1000 03/20/15 1331 03/21/15 0530  NA 127* 135 141 142  --  140  K 4.6 3.8 3.6 3.8  --  4.0  CL 93* 107 112* 113*  --   111  CO2 10* 18* 19* 17*  --  19*  GLUCOSE 1298* 681* 206* 116*  --  161*  BUN 78* 66* 59* 54*  --  36*  CREATININE 5.44* 4.26* 3.42* 3.18* 3.00* 1.97*  CALCIUM 8.2* 7.4* 7.8* 7.7*  --  8.0*   Liver Function Tests:  Recent Labs Lab 03/19/15 2008 03/20/15 0740 03/20/15 1000  AST 25 21  --   ALT 11* 11*  --   ALKPHOS 86 64  --   BILITOT 1.3* 0.3  --   PROT 6.2* 5.5*  --   ALBUMIN 3.5 2.9* 3.0*   CBC:  Recent Labs Lab 03/19/15 2008 03/20/15 0740 03/20/15 1331  WBC 11.1* 11.1* 12.1*  NEUTROABS 9.4* 8.3*  --   HGB 10.4* 10.6* 10.7*  HCT 35.6* 31.9* 31.7*  MCV 100.6* 87.4 88.1  PLT 165 137* 128*   Cardiac Enzymes:  Recent Labs Lab 03/20/15 1000 03/20/15 1550 03/20/15 2020  TROPONINI 0.75* 0.77* 0.72*   CBG:  Recent Labs Lab 03/20/15 1652 03/20/15 2125 03/21/15 0008 03/21/15 0404 03/21/15 0745  GLUCAP 121* 212* 226* 134* 189*    Recent Results (from the past 240 hour(s))  Urine culture     Status: None   Collection Time: 03/19/15  7:05 PM  Result Value Ref Range Status   Specimen Description URINE, CATHETERIZED  Final   Special Requests NONE  Final   Colony Count NO GROWTH Performed at [facility]   Final   Culture NO GROWTH Performed at [facility]   Final   Report Status 03/21/2015 FINAL  Final  Culture, blood (routine x 2)     Status: None (Preliminary result)   Collection Time: 03/19/15  8:10 PM  Result Value Ref Range Status   Specimen Description BLOOD  LEFT HAND  Final   Special Requests BOTTLES DRAWN AEROBIC AND ANAEROBIC 5CC EA  Final   Culture   Final           BLOOD CULTURE RECEIVED NO GROWTH TO DATE CULTURE WILL BE HELD FOR 5 DAYS BEFORE ISSUING A FINAL NEGATIVE REPORT Performed at [facility]    Report Status PENDING  Incomplete  Culture, blood (routine x 2)     Status: None (Preliminary result)   Collection Time: 03/19/15  9:11 PM  Result Value Ref Range Status   Specimen Description BLOOD RIGHT HAND   Final   Special Requests BOTTLES DRAWN AEROBIC ONLY 10CC  Final   Culture   Final           BLOOD CULTURE RECEIVED NO GROWTH TO DATE CULTURE WILL BE HELD FOR 5 DAYS BEFORE ISSUING A FINAL NEGATIVE REPORT Performed at [facility]    Report Status PENDING  Incomplete  MRSA PCR Screening     Status: None   Collection Time: 03/20/15  3:35 PM  Result Value Ref Range Status   MRSA by PCR NEGATIVE NEGATIVE Final    Comment:        The GeneXpert MRSA Assay (FDA approved for NASAL specimens only), is one component of a comprehensive MRSA colonization surveillance program. It is not intended to diagnose MRSA infection  nor to guide or monitor treatment for MRSA infections.      Studies:  Recent x-ray studies have been reviewed in detail by the Attending Physician  Scheduled Meds:  Scheduled Meds: . aspirin EC  81 mg Oral Daily  . FLUoxetine  20 mg Oral Daily  . heparin  5,000 Units Subcutaneous 3 times per day  . insulin aspart  0-9 Units Subcutaneous TID WC  . piperacillin-tazobactam (ZOSYN)  IV  3.375 g Intravenous 3 times per day  . vancomycin  1,000 mg Intravenous Q24H    Time spent on care of this patient: 35 mins  Cherene Altes, MD [facility] For Consults/Admissions - Flow Manager - [phone] Office  [phone]  Contact MD directly via text page:      [email or web address]      password [email or web address]  03/21/2015, 11:04 AM   LOS: 2 days

## 2015-03-21 NOTE — Progress Notes (Signed)
Patient arrived to 4N02 at 1730. Patient alert and oreinted X4 with no c/o pain. Vital signs taken andcharted.  IVF infusing via Left triple luman IJ. Telemetry box 5 applied and CCMD notified. Oriented to room with bed alarm on . Family at bedside. Report received prior to admission on 4N by Erick Blinks, RN of [facility].

## 2015-03-21 NOTE — Progress Notes (Signed)
Medicare Important Message given? YES (If response is "NO", the following Medicare IM given date fields will be blank) Date Medicare IM given:03/21/15 Medicare IM given by: Cher Egnor 

## 2015-03-21 NOTE — Evaluation (Signed)
Speech Language Pathology Evaluation Patient Details Name: Ustin Tennenbaum MRN: [id number] DOB: [birth year] Today's Date: 03/21/2015 Time: [id number] SLP Time Calculation (min) (ACUTE ONLY): 11 min  Problem List:  Patient Active Problem List   Diagnosis Date Noted  . Thalamic infarct, acute 03/21/2015  . Acute-on-chronic kidney injury 03/20/2015  . Stroke 03/20/2015  . Type 2 diabetes mellitus with ketoacidosis without coma   . Acute encephalopathy   . Hypocalcemia   . Other specified hypotension   . Encounter for central line placement   . CKD (chronic kidney disease) stage 3, GFR 30-59 ml/min 03/20/2014  . Encephalopathy acute 03/19/2014  . Hypoglycemia 01/21/2014  . Seizure 01/21/2014  . Hypokalemia 01/21/2014  . Lactic acidosis 01/21/2014  . Seizures 01/21/2014  . Diabetic hyperosmolar non-ketotic state 07/04/2013  . AKI (acute kidney injury) 07/04/2013  . Hyponatremia 07/03/2013  . Fall 07/03/2013  . HTN (hypertension) 07/03/2013  . Renal failure (ARF), acute on chronic 05/31/2013  . Diabetic ketoacidosis 05/29/2013  . Acute blood loss anemia 02/07/2013  . Closed right hip fracture 02/02/2013  . Hyperglycemia 02/02/2013  . Edema of right lower extremity 02/02/2013  . CKD (chronic kidney disease) stage 2, GFR 60-89 ml/min 02/02/2013  . Anemia of chronic disease 02/02/2013  . DIABETES MELLITUS, TYPE II 06/22/2007   Past Medical History:  Past Medical History  Diagnosis Date  . Diabetes mellitus without complication   . Hypertension   . Hernia   . Back pain   . Seizures    Past Surgical History:  Past Surgical History  Procedure Laterality Date  . Hip arthroplasty Right 02/05/2013    Procedure: ARTHROPLASTY BIPOLAR HIP;  Surgeon: [name], MD;  Location: [facility]S;  Service: Orthopedics;  Laterality: Right;   HPI:  69 y.o. male with diabetes who was noted to be confused and lethargic. Patient states he ran out of his insulin in his insulin pump and started using  his sisters short acting insulin; admitted with MS changes; encephalopathy; hypocalcemia; acute on chronic kidney disease; MRI revealed a subtle 1 cm acute ischemic infarct in the right thalamus.    Assessment / Plan / Recommendation Clinical Impression  Pt presents with normal language; clear speech; cognition, per portions of MOCA, marked by intact working/short-term recall, attention, and executive function, not unexpected given site of lesion.  No SLP f/u recommended. Pt in agreement.     SLP Assessment  Patient does not need any further Speech Lanaguage Pathology Services       SLP Evaluation Prior Functioning  Cognitive/Linguistic Baseline: Within functional limits Type of Home: House  Lives With: Family Available Help at Discharge: Family Vocation: Retired   Associate Professor  Overall Cognitive Status: Within Functional Limits for tasks assessed Arousal/Alertness: Awake/alert Orientation Level: Oriented X4 Attention: Selective Selective Attention: Appears intact Memory: Appears intact Awareness: Appears intact Executive Function: Writer: Appears intact Safety/Judgment: Appears intact    Comprehension  Auditory Comprehension Overall Auditory Comprehension: Appears within functional limits for tasks assessed Visual Recognition/Discrimination Discrimination: Within Function Limits Reading Comprehension Reading Status: Within funtional limits    Expression Expression Primary Mode of Expression: Verbal Verbal Expression Overall Verbal Expression: Appears within functional limits for tasks assessed   Oral / Motor Oral Motor/Sensory Function Overall Oral Motor/Sensory Function: Appears within functional limits for tasks assessed Motor Speech Overall Motor Speech: Appears within functional limits for tasks assessed   Leray Garverick L. Tivis Ringer, [place] CCC/SLP Pager [phone]      Juan Quam Laurice 03/21/2015, 3:47 PM

## 2015-03-21 NOTE — Progress Notes (Signed)
UR COMPLETED  

## 2015-03-21 NOTE — Progress Notes (Signed)
Report called to [place], receiving RN on  [place]. VSS. Transferred to [id number] via wheelchair with personal belongings.  [facility]

## 2015-03-21 NOTE — Progress Notes (Signed)
Inpatient Diabetes Program Recommendations  AACE/ADA: New Consensus Statement on Inpatient Glycemic Control (2013)  Target Ranges:  Prepandial:   less than 140 mg/dL      Peak postprandial:   less than 180 mg/dL (1-2 hours)      Critically ill patients:  140 - 180 mg/dL   Review of Glycemic Control: Per history patient ran out of insulin and was using sister's short acting insulin the past 2 days prior to admit.  Diabetes history: Type 2 diabetes Outpatient Diabetes medications:  Novolog 70/30 10 units bid, Lantus 15 units daily Current orders for Inpatient glycemic control:  Novolog sensitive tid with meals  Please start Lantus 15 units daily as per home.  Also consider restarting Novolog 70/30 10 units bid.    Thanks,  Adah Perl, RN, BC-ADM Inpatient Diabetes Coordinator Pager [phone] (8a-5p)

## 2015-03-21 NOTE — Progress Notes (Addendum)
Inpatient Diabetes Program Recommendations  AACE/ADA: New Consensus Statement on Inpatient Glycemic Control (2013)  Target Ranges:  Prepandial:   less than 140 mg/dL      Peak postprandial:   less than 180 mg/dL (1-2 hours)      Critically ill patients:  140 - 180 mg/dL    Spoke with patient regarding home diabetes management.  He reports living with his mother who is 70 years old.  He states that he was unable to get to the pharmacy to pick up his insulin and thus was taking his sisters rapid acting insulin (that she used in her insulin pump).  He asked me about attending Diabetes classes and is interested.  Told him that referral would be placed and that he would be called to make appointment.   He states that his siblings are going to help him more to make sure that he has what he needs.  He admits that transportation is an issue for him in getting to appointments and picking up his meds.  States that he sees Dr. Holley Raring in [facility] and that they also want him to have more education about diabetes.  Will order "diabetes videos" and Living Well with Diabetes booklet for patient also and follow-up 03/22/15.  Discussed with Dr. Thereasa Solo who states he will switch to simpler insulin regimen.  Thanks, Adah Perl, RN, BC-ADM Inpatient Diabetes Coordinator Pager [phone]   Reviewed chart and noted that patient had similar complaints on admission last year on 03/19/14.  He appears to be very sensitive to insulin and has also had issues with hypoglycemia.  Agree with current regimen.  May benefit from Home health referral at discharge if appropriate.

## 2015-03-21 NOTE — Progress Notes (Signed)
Preliminary results by tech - Carotid Duplex Completed. No evidence of stenosis noted in bilateral carotid arteries.  Oda Cogan, BS, RDMS, RVT

## 2015-03-21 NOTE — Clinical Documentation Improvement (Signed)
Pt admitted with BUN of 78 and trending down with hydration to 66, 59, 54, 36.  Creatinine on admission was 5.44, and trending down - 4.26, 3.42, 3.18, 3, 1.97.  GFR - H&P and progress states CKD.  _______CKD Stage I - GFR > OR = 90 _______CKD Stage II - GFR 60-80 _______CKD Stage III - GFR 30-59 _______CKD Stage IV - GFR 15-29 _______CKD Stage V - GFR < 15 _______ESRD (End Stage Renal Disease) _______Other condition_____________ _______Cannot Clinically determine   Thank You, Margretta Sidle ,RN Clinical Documentation Specialist:    [name] [phone] Cell [phone]

## 2015-03-21 NOTE — Progress Notes (Addendum)
STROKE TEAM PROGRESS NOTE   HISTORY [name] [name] is an 69 y.o. male with diabetes who was noted to be confused and lethargic. Patient states he ran out of his insulin in his insulin pump and started using his sisters short acting insulin. On arrival to ED he was noted to have a temperature of 101, BG 1298, Cr 5.44, WBC 11.1. Patient was hydrated and started on empiric Vancomycin and Zosyn for possible sepsis. UC and BC pending. MRI was obtained while in ED due to continued confusion. Patient was not administered TPA secondary to non-focal presentation, stroke not suspected. 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 Neurology was consulted due to these findings.   Patient will be admitted to hospital for further evaluation of Sepsis (blood culture and urine cultures pending) and stroke work up. Currently patient has no complaints.    SUBJECTIVE (INTERVAL HISTORY) No family is at the bedside.  Overall he feels his condition is gradually improving. He would like to go home today - says he is optimistic.   OBJECTIVE Temp:  [98.3 F (36.8 C)-99 F (37.2 C)] 98.6 F (37 C) (06/08 0748) Pulse Rate:  [60-73] 61 (06/08 0748) Cardiac Rhythm:  [-] Normal sinus rhythm (06/08 0800) Resp:  [12-27] 23 (06/08 0748) BP: (103-164)/(39-89) 164/76 mmHg (06/08 0748) SpO2:  [95 %-100 %] 98 % (06/08 0748) Weight:  [71.7 kg (158 lb 1.1 oz)] 71.7 kg (158 lb 1.1 oz) (06/07 1534)   Recent Labs Lab 03/20/15 1652 03/20/15 2125 03/21/15 0008 03/21/15 0404 03/21/15 0745  GLUCAP 121* 212* 226* 134* 189*    Recent Labs Lab 03/19/15 2008 03/20/15 0148 03/20/15 0740 03/20/15 1000 03/20/15 1331 03/21/15 0530  NA 127* 135 141 142  --  140  K 4.6 3.8 3.6 3.8  --  4.0  CL 93* 107 112* 113*  --  111  CO2 10* 18* 19* 17*  --  19*  GLUCOSE 1298* 681* 206* 116*  --  161*  BUN 78* 66* 59* 54*  --  36*  CREATININE 5.44* 4.26* 3.42* 3.18* 3.00* 1.97*  CALCIUM 8.2* 7.4* 7.8*  7.7*  --  8.0*    Recent Labs Lab 03/19/15 2008 03/20/15 0740 03/20/15 1000  AST 25 21  --   ALT 11* 11*  --   ALKPHOS 86 64  --   BILITOT 1.3* 0.3  --   PROT 6.2* 5.5*  --   ALBUMIN 3.5 2.9* 3.0*    Recent Labs Lab 03/19/15 2008 03/20/15 0740 03/20/15 1331  WBC 11.1* 11.1* 12.1*  NEUTROABS 9.4* 8.3*  --   HGB 10.4* 10.6* 10.7*  HCT 35.6* 31.9* 31.7*  MCV 100.6* 87.4 88.1  PLT 165 137* 128*    Recent Labs Lab 03/20/15 1000 03/20/15 1550 03/20/15 2020  TROPONINI 0.75* 0.77* 0.72*    Recent Labs  03/20/15 1331  LABPROT 13.9  INR 1.05    Recent Labs  03/19/15 1905  COLORURINE YELLOW  LABSPEC 1.023  PHURINE 5.0  GLUCOSEU >1000*  HGBUR NEGATIVE  BILIRUBINUR NEGATIVE  KETONESUR 15*  PROTEINUR 30*  UROBILINOGEN 0.2  NITRITE NEGATIVE  LEUKOCYTESUR NEGATIVE       Component Value Date/Time   CHOL 148 03/21/2015 0530   TRIG 139 03/21/2015 0530   HDL 43 03/21/2015 0530   CHOLHDL 3.4 03/21/2015 0530   VLDL 28 03/21/2015 0530   LDLCALC 77 03/21/2015 0530   Lab Results  Component Value Date   HGBA1C 11.8* 03/19/2014  Component Value Date/Time   LABOPIA POSITIVE* 03/19/2015 1905   COCAINSCRNUR NONE DETECTED 03/19/2015 1905   LABBENZ NONE DETECTED 03/19/2015 1905   AMPHETMU NONE DETECTED 03/19/2015 1905   THCU NONE DETECTED 03/19/2015 1905   LABBARB NONE DETECTED 03/19/2015 1905    No results for input(s): ETH in the last 168 hours.  Dg Lumbar Spine Complete  03/19/2015   CLINICAL DATA:  Altered mental status. [place] earlier today in his assisted living home.  EXAM: LUMBAR SPINE - COMPLETE 4+ VIEW  COMPARISON:  None.  FINDINGS: There is no evidence of lumbar spine fracture. Alignment is normal. Degenerative changes at L4-5 and L5-S1 chronic, with disc space narrowing, and slight facet mediated slip at L4-5. No worrisome osseous lesions. New RIGHT hip replacement appears uncomplicated.  IMPRESSION: Degenerative disc disease.  No acute abnormality.    Electronically Signed   By: Rolla Flatten M.D.   On: 03/19/2015 23:33   Ct Head Wo Contrast  03/19/2015   CLINICAL DATA:  Recent fall, altered mental status  EXAM: CT HEAD WITHOUT CONTRAST  TECHNIQUE: Contiguous axial images were obtained from the base of the skull through the vertex without intravenous contrast.  COMPARISON:  03/19/2014  FINDINGS: Bony calvarium is intact. The paranasal sinuses and mastoid air cells are well aerated. Mild atrophic changes are seen. No findings to suggest acute hemorrhage, acute infarction or space-occupying mass lesion are noted.  IMPRESSION: Chronic atrophic changes without acute abnormality.   Electronically Signed   By: Inez Catalina M.D.   On: 03/19/2015 22:00   [name]  03/20/2015   CLINICAL DATA:  Stroke.  Right thalamic infarct.  Diabetes.  EXAM: MRA HEAD WITHOUT CONTRAST  TECHNIQUE: Angiographic images of the Circle of Willis were obtained using MRA technique without intravenous contrast.  COMPARISON:  MRI head 03/20/2015  FINDINGS: Both vertebral arteries patent to the basilar bilaterally. Mild stenosis distal right vertebral artery. Basilar is tortuous but patent. Superior cerebellar and posterior cerebral arteries patent bilaterally without significant stenosis. PICA patent bilaterally  Internal carotid artery patent bilaterally without significant stenosis. Tortuous intracranial circulation without significant stenosis.  Negative for cerebral aneurysm.  Image quality degraded by patient motion  IMPRESSION: Motion degraded study. No significant intracranial stenosis or occlusion.   Electronically Signed   By: Franchot Gallo M.D.   On: 03/20/2015 21:28   Mr Brain Wo Contrast  03/20/2015   CLINICAL DATA:  Initial evaluation for acute encephalopathy.  EXAM: MRI HEAD WITHOUT CONTRAST  TECHNIQUE: Multiplanar, multiecho pulse sequences of the brain and surrounding structures were obtained without intravenous contrast.  COMPARISON:  Prior CT from 03/19/2015.   FINDINGS: Diffuse prominence of the CSF containing spaces is compatible with generalized age-related atrophy. Patchy and confluent T2/FLAIR hyperintensity within the periventricular and deep white matter both cerebral hemispheres most consistent with chronic small vessel ischemic disease, fairly mild for patient age. Similar changes seen within the pons.  There is a small 1 cm focus of restricted diffusion within the lateral aspect of the right thalamus, consistent 4 an acute ischemic infarct (series 3, image 23). Corresponding signal dropout seen on ADC map. No significant mass effect. No associated hemorrhage. No other infarct identified.  No mass lesion, mass effect, or midline shift. No hydrocephalus. No extra-axial fluid collection.  Craniocervical junction within normal limits. Reversal of the normal cervical lordosis with advanced degenerative disc disease present within the partially visualized upper cervical spine. There is associated mild to moderate canal stenosis.  Incidental note  made of a partially empty sella.  No acute abnormality about the orbits.  Mild mucosal thickening present within the left maxillary sinus. Small amount of layering opacity present within the right sphenoid sinus. Paranasal sinuses are otherwise clear. No mastoid effusion.  Inner ear structures normal.  Bone marrow signal intensity within normal limits. No scalp soft tissue abnormality.  IMPRESSION: 1. Subtle 1 cm acute ischemic infarct within the right thalamus. No associated hemorrhage or mass effect. 2. No other acute intracranial process. 3. Atrophy with mild chronic small vessel ischemic disease. 4. 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 Finding is incompletely evaluated on this exam. This could be further characterized with dedicated MRI of the cervical spine as clinically desired.   Electronically Signed   By: Jeannine Boga M.D.   On:  03/20/2015 05:41   US Renal  03/20/2015   CLINICAL DATA:  Acute renal injury  EXAM: RENAL / URINARY TRACT ULTRASOUND COMPLETE  COMPARISON:  None.  FINDINGS: Right Kidney:  Length: 11.4 cm. Echogenicity within normal limits. No mass or hydronephrosis visualized.  Left Kidney:  Length: 11.7 Cm. A few small cysts are noted. The largest of these measures 9 mm in greatest dimension. No mass lesion or hydronephrosis is noted.  Bladder:  Appears normal for degree of bladder distention.  IMPRESSION: Left renal cysts.  No acute abnormality noted.   Electronically Signed   By: Inez Catalina M.D.   On: 03/20/2015 09:10   Dg Chest Port 1 View  03/20/2015   CLINICAL DATA:  69 year old male status post central line placement. Initial encounter.  EXAM: PORTABLE CHEST - 1 VIEW  COMPARISON:  1328 hr today and earlier.  FINDINGS: Portable AP semi upright view at 1421 hrs. New left IJ approach central venous catheter. The tip projects just above the level of the carina, however has a curved configuration distally. No pneumothorax. Mediastinal contours remain normal. Stable ventilation.  IMPRESSION: Left IJ approach central line placed with tip at the level of the SVC. Curved configuration of the tip might indicate extension into the proximal azygos vein. A lateral view of the chest should confirm. Otherwise no adverse features.   Electronically Signed   By: Genevie Ann M.D.   On: 03/20/2015 14:30   Dg Chest Port 1 View  03/20/2015   CLINICAL DATA:  Status post attempted central line placement  EXAM: PORTABLE CHEST - 1 VIEW  COMPARISON:  03/19/2015  FINDINGS: Cardiac shadow is stable. The lungs are well aerated bilaterally. No pneumothorax is seen. No pleural effusion is noted. The bony structures are within normal limits.  IMPRESSION: No evidence of pneumothorax or pleural effusion.   Electronically Signed   By: Inez Catalina M.D.   On: 03/20/2015 13:57   Dg Chest Port 1 View  03/19/2015   CLINICAL DATA:  Fever for 1 day  EXAM:  PORTABLE CHEST - 1 VIEW  COMPARISON:  03/19/2014  FINDINGS: The heart size and mediastinal contours are within normal limits. Both lungs are clear. The visualized skeletal structures are unremarkable.  IMPRESSION: No active disease.   Electronically Signed   By: Kathreen Devoid   On: 03/19/2015 19:40   2D Echocardiogram   - Left ventricle: The cavity size was normal. Wall thickness wasnormal. Systolic function was normal. The estimated ejectionfraction was in the range of 60% to 65%. Wall motion was normal;there were no regional wall motion abnormalities. Leftventricular diastolic function parameters were normal. - Right  ventricle: The cavity size was mildly dilated. - Atrial septum: No defect or patent foramen ovale was identified. - Tricuspid valve: There was moderate regurgitation.   PHYSICAL EXAM PHYSICAL EXAM Physical exam: Exam: Gen: NAD Eyes: anicteric sclerae, moist conjunctivae                    CV: no MRG, no carotid bruits, no peripheral edema Mental Status: Alert, follows commands, good historian  Neuro: Detailed Neurologic Exam  Speech:    No aphasia, no dysarthria  Cranial Nerves:    The pupils are equal, round, and reactive to light.. Attempted, Fundi not visualized.  EOMI. No gaze preference. Visual fields full. Face symmetric, Tongue midline. Hearing intact to voice. Shoulder shrug intact  Motor Observation:    no involuntary movements noted. Tone appears normal.     Strength:    5/5     Sensation:  Intact to LT  Plantars downgoing.    Gait: Unable to test   ASSESSMENT/PLAN [name] is a 69 y.o. male with history of diabetes mellitus, chronic kidney disease, hypertension, chronic back pain presenting with generalized weakness and lethargy. He did not receive IV t-PA due to stroke not suspected.   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 small vessel disease   MRA  No significant stenosis   Carotid Doppler  pending   2D Echo  No source of embolus   LDL 77  HgbA1c pending  Heparin 5000 units sq tid for VTE prophylaxis Diet Carb Modified Fluid consistency:: Thin; Room service appropriate?: Yes  no antithrombotic prior to admission, now on no antithrombotic. Will add aspirin 81 mg daily  Patient counseled to be compliant with his antithrombotic medications  Ongoing aggressive stroke risk factor management  Therapy recommendations:  pending   Disposition:  pending   Cervical Spine Stenosis  MRI - incompletely evaluated Advanced degenerative disc disease within the partially visualized upper cervical spine  Hypotensive  Hx Hypertension  Home meds:   altace  Low yesterday, up to 164/76  Hyperlipidemia  Home meds:  No statin  LDL 77, goal < 70  Recommend Addition of statin and continue at discharge  Diabetes type 2 uncontrolled  HgbA1c pending , goal < 7.0  Other Stroke Risk Factors  Advanced age  Former Cigarette smoker, quit smoking 2 years ago   ETOH use  Other Active Problems  Acute encephalopathy, possible sepsis, multifactorial. On vacomycin and zosyn  Hypocalcemia  Acute on chronic kidney diease  Fall  Other Pertinent History  Hx Seizures  Hospital day # [place] for Pager information 03/21/2015 10:55 AM   Personally examined patient and images, and have participated in and made any corrections needed to history, physical, neuro exam,assessment and plan as stated above.   Sarina Ill, MD Stroke Neurology [phone] [facility]        To contact Stroke Continuity provider, please refer to [email or web address] After hours, contact General Neurology

## 2015-03-21 NOTE — Progress Notes (Signed)
PT Cancellation Note  Patient Details Name: [name] [name] MRN: [id number] DOB: [birth year]   Cancelled Treatment:    Reason Eval/Treat Not Completed: Patient declined, no reason specified.  Pt reports he is freezing and asks to defer to tomorrow. 03/21/2015  Donnella Sham, [place] [phone]  (pager)   [place], Tessie Fass 03/21/2015, 4:02 PM

## 2015-03-22 DIAGNOSIS — I10 Essential (primary) hypertension: Secondary | ICD-10-CM

## 2015-03-22 DIAGNOSIS — G9340 Encephalopathy, unspecified: Secondary | ICD-10-CM

## 2015-03-22 DIAGNOSIS — N179 Acute kidney failure, unspecified: Secondary | ICD-10-CM

## 2015-03-22 DIAGNOSIS — E1310 Other specified diabetes mellitus with ketoacidosis without coma: Secondary | ICD-10-CM

## 2015-03-22 LAB — CBC
HCT: 29.5 % — ABNORMAL LOW (ref 39.0–52.0)
Hemoglobin: 10.1 g/dL — ABNORMAL LOW (ref 13.0–17.0)
MCH: 30.1 pg (ref 26.0–34.0)
MCHC: 34.2 g/dL (ref 30.0–36.0)
MCV: 87.8 fL (ref 78.0–100.0)
Platelets: 88 10*3/uL — ABNORMAL LOW (ref 150–400)
RBC: 3.36 MIL/uL — ABNORMAL LOW (ref 4.22–5.81)
RDW: 13.3 % (ref 11.5–15.5)
WBC: 6.3 10*3/uL (ref 4.0–10.5)

## 2015-03-22 LAB — BASIC METABOLIC PANEL
Anion gap: 9 (ref 5–15)
BUN: 16 mg/dL (ref 6–20)
CO2: 22 mmol/L (ref 22–32)
Calcium: 8.2 mg/dL — ABNORMAL LOW (ref 8.9–10.3)
Chloride: 108 mmol/L (ref 101–111)
Creatinine, Ser: 1.05 mg/dL (ref 0.61–1.24)
GFR calc Af Amer: >60 mL/min (ref >60)
GFR calc non Af Amer: >60 mL/min (ref >60)
Glucose, Bld: 103 mg/dL — ABNORMAL HIGH (ref 65–99)
Potassium: 3.2 mmol/L — ABNORMAL LOW (ref 3.5–5.1)
Sodium: 139 mmol/L (ref 135–145)

## 2015-03-22 LAB — GLUCOSE, CAPILLARY
Glucose-Capillary: 191 mg/dL — ABNORMAL HIGH (ref 65–99)
Glucose-Capillary: 124 mg/dL — ABNORMAL HIGH (ref 65–99)
Glucose-Capillary: 216 mg/dL — ABNORMAL HIGH (ref 65–99)
Glucose-Capillary: 131 mg/dL — ABNORMAL HIGH (ref 65–99)
Glucose-Capillary: 155 mg/dL — ABNORMAL HIGH (ref 65–99)

## 2015-03-22 LAB — HEMOGLOBIN A1C
Hgb A1c MFr Bld: 10.6 % — ABNORMAL HIGH (ref 4.8–5.6)
Hgb A1c MFr Bld: 10.6 % — ABNORMAL HIGH (ref 4.8–5.6)
Mean Plasma Glucose: 258 mg/dL
Mean Plasma Glucose: 258 mg/dL

## 2015-03-22 MED ORDER — POTASSIUM CHLORIDE CRYS ER 20 MEQ PO TBCR
40.0000 meq | EXTENDED_RELEASE_TABLET | Freq: Once | ORAL | Status: AC
  Administered 2015-03-22: 40 meq via ORAL
  Filled 2015-03-22: qty 2

## 2015-03-22 NOTE — Evaluation (Signed)
Occupational Therapy Evaluation Patient Details Name: Geovanni Orlandi MRN: [id number] DOB: [birth year] Today's Date: 03/22/2015    History of Present Illness Pledger Debenedetti is an 69 y.o. male with diabetes who was noted to be confused and lethargic. 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   Clinical Impression   Patient evaluated by Occupational Therapy with no further acute OT needs identified. All education has been completed and the patient has no further questions. See below for any follow-up Occupational Therapy or equipment needs. OT to sign off. Thank you for referral.   Pt demo full adl at sink and transferred to bottom of tub during session.      Follow Up Recommendations  Outpatient OT    Equipment Recommendations  None recommended by OT    Recommendations for Other Services       Precautions / Restrictions Precautions Precautions: None Restrictions Weight Bearing Restrictions: No      Mobility Bed Mobility Overal bed mobility: Modified Independent             General bed mobility comments: extra time  Transfers Overall transfer level: Needs assistance Equipment used: None Transfers: Sit to/from Stand Sit to Stand: Supervision         General transfer comment: supervision for safety. Mild sway noted and slower to rise however no physical assist required.    Balance Overall balance assessment: Needs assistance Sitting-balance support: No upper extremity supported;Feet supported Sitting balance-Leahy Scale: Good     Standing balance support: No upper extremity supported Standing balance-Leahy Scale: Fair   Single Leg Stance - Right Leg: 0 Single Leg Stance - Left Leg: 0     Rhomberg - Eyes Opened: 1 Rhomberg - Eyes Closed: 0.5                ADL Overall ADL's : At baseline                                        General ADL Comments: did note one LOB     Vision     Perception     Praxis      Pertinent Vitals/Pain Pain Assessment: No/denies pain     Hand Dominance Right   Extremity/Trunk Assessment Upper Extremity Assessment Upper Extremity Assessment: Overall WFL for tasks assessed   Lower Extremity Assessment Lower Extremity Assessment: Defer to PT evaluation   Cervical / Trunk Assessment Cervical / Trunk Assessment: Normal   Communication Communication Communication: No difficulties   Cognition Arousal/Alertness: Awake/alert Behavior During Therapy: WFL for tasks assessed/performed Overall Cognitive Status: Within Functional Limits for tasks assessed                     General Comments       Exercises       Shoulder Instructions      Home Living Family/patient expects to be discharged to:: Private residence Living Arrangements: Parent Available Help at Discharge: Family;Available 24 hours/day Type of Home: House Home Access: Level entry     Home Layout: One level     Bathroom Shower/Tub: Teacher, early years/pre: Standard     Home Equipment: None   Additional Comments: does not drive      Prior Functioning/Environment Level of Independence: Independent  OT Diagnosis:     OT Problem List:     OT Treatment/Interventions:      OT Goals(Current goals can be found in the care plan section) Acute Rehab OT Goals Patient Stated Goal: to go home now OT Goal Formulation: With patient  OT Frequency:     Barriers to D/C:            Co-evaluation              End of Session Equipment Utilized During Treatment: Gait belt Nurse Communication: Mobility status;Precautions  Activity Tolerance: Patient tolerated treatment well Patient left: in chair;with call bell/phone within reach;with chair alarm set   Time: 0930-1000 OT Time Calculation (min): 30 min Charges:  OT General Charges $OT Visit: 1  Procedure OT Evaluation $Initial OT Evaluation Tier I: 1 Procedure OT Treatments $Self Care/Home Management : 8-22 mins G-Codes:    Peri Maris 03-25-2015, 2:27 PM  Pager: [phone]

## 2015-03-22 NOTE — Progress Notes (Signed)
Progress Note  [name] [name] [id number] DOB: 07-09-1946 DOA: 03/19/2015 PCP: Guadlupe Spanish, MD  Admit HPI / Brief Narrative: 69 y.o. male with Hx uncontrolled DM2, chronic kidney disease, hypertension, and chronic back pain who presented with complaints of generalized weakness and lethargy for 24hrs.  Patient ran out of his insulin 2 days prior and had been using his sister's short-acting insulin which she uses for her insulin pump.  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 Wall thickness was normal. Systolic function was normal. The estimated ejection fraction was in the range of 60% to 65%. Wall motion was normal; there were no regional wall motion abnormalities. Left ventricular diastolic function parameters were normal. - Right ventricle: The cavity size was mildly dilated. - Atrial septum: No defect or patent foramen ovale was identified. - Tricuspid valve: There was moderate regurgitation. Patient with ongoing insulin titration for better blood sugar control- HgbA1C- 10.6 -PT/OT consults -LDL 77  Aute Encephalopathy / SIRS (not sepsis) -multifactorial to include DKA, acute stroke, hypotension, and uremia -no evidence of acute infection, therefore abx being d/c  -mental status has returned to baseline w/ correction of above issues   Hypocalcemia -corrected calcium 8.8 - follow w/o tx today in setting of aggressive volume expansion   Hypotension -resolved w/ volume resuscitation - most c/w DH due to DKA / osmotic diuresis   AKI on chronic kidney disease stage 3 -secondary to DKA and  dehydration - rapidly improving w/ volume expansion  -Renal ultrasound w/o acute findings  -baseline crt ~1.5 / GFR 88  Fall -Most likely multifactorial to include DKA, acute stroke, hypotension, and uremia -Patient does not appear to have an acute fracture or c/o focal pain  -PT/OT to see   Mildly elevated troponin -The patient is asymptomatic and the troponin has remained flat most consistent with acute renal failure and hypotension related myocardial stress  Code Status: FULL Family Communication: no family present at time of exam Disposition Plan: home in AM once work up complete (await OT)  Consultants: Neurology  Procedure/Significant Events: 6/7 L-spine x-ray DDD, DJD 6/7 CT head without contrast no acute abnormality  6/7 MRI brain without contrast Subtle 1 cm acute ischemic infarct within the right thalamus 6/7 Echocardiogram:Left ventricle EF=60% to 65% - Right ventricle: mildly dilated - Tricuspid valve: moderate regurgitation  Antibiotics: Zosyn 6/6 > 6/7 Vancomycin 6/6   DVT prophylaxis: Heparin SQ  Objective: Blood pressure 153/72, pulse 62, temperature 98.8 F (37.1 C), temperature source Oral, resp. rate 18, height 5\' 9"  (1.753 m), weight 71.7 kg (158 lb 1.1 oz), SpO2 99 %.  Intake/Output Summary (Last 24 hours) at 03/22/15 1024 Last data filed at 03/22/15 0844  Gross per 24 hour  Intake    855 ml  Output   1575 ml  Net   -720 ml   Exam: General: No acute respiratory distress - alert and conversant  Lungs: Clear to auscultation bilaterally without wheezes or crackles Cardiovascular: Regular rate and rhythm without murmur gallop or rub normal S1 and S2 Abdomen: Nontender, nondistended, soft, bowel sounds positive, no rebound, no ascites, no appreciable mass Extremities: No  significant cyanosis, clubbing, or edema bilateral lower extremities - nails B feed c/w onychomycosis   Data Reviewed: Basic Metabolic Panel:  Recent Labs Lab 03/20/15 0148  03/20/15 0740 03/20/15 1000 03/20/15 1331 03/21/15 0530 03/22/15 0446  NA 135 141 142  --  140 139  K 3.8 3.6 3.8  --  4.0 3.2*  CL 107 112* 113*  --  111 108  CO2 18* 19* 17*  --  19* 22  GLUCOSE 681* 206* 116*  --  161* 103*  BUN 66* 59* 54*  --  36* 16  CREATININE 4.26* 3.42* 3.18* 3.00* 1.97* 1.05  CALCIUM 7.4* 7.8* 7.7*  --  8.0* 8.2*   Liver Function Tests:  Recent Labs Lab 03/19/15 2008 03/20/15 0740 03/20/15 1000  AST 25 21  --   ALT 11* 11*  --   ALKPHOS 86 64  --   BILITOT 1.3* 0.3  --   PROT 6.2* 5.5*  --   ALBUMIN 3.5 2.9* 3.0*   CBC:  Recent Labs Lab 03/19/15 2008 03/20/15 0740 03/20/15 1331 03/22/15 0446  WBC 11.1* 11.1* 12.1* 6.3  NEUTROABS 9.4* 8.3*  --   --   HGB 10.4* 10.6* 10.7* 10.1*  HCT 35.6* 31.9* 31.7* 29.5*  MCV 100.6* 87.4 88.1 87.8  PLT 165 137* 128* 88*   Cardiac Enzymes:  Recent Labs Lab 03/20/15 1000 03/20/15 1550 03/20/15 2020  TROPONINI 0.75* 0.77* 0.72*   CBG:  Recent Labs Lab 03/21/15 0745 03/21/15 1235 03/21/15 1603 03/21/15 1740 03/22/15 0801  GLUCAP 189* 279* 230* 260* 124*    Recent Results (from the past 240 hour(s))  Urine culture     Status: None   Collection Time: 03/19/15  7:05 PM  Result Value Ref Range Status   Specimen Description URINE, CATHETERIZED  Final   Special Requests NONE  Final   Colony Count NO GROWTH Performed at [facility]   Final   Culture NO GROWTH Performed at [facility]   Final   Report Status 03/21/2015 FINAL  Final  Culture, blood (routine x 2)     Status: None (Preliminary result)   Collection Time: 03/19/15  8:10 PM  Result Value Ref Range Status   Specimen Description BLOOD  LEFT HAND  Final   Special Requests BOTTLES DRAWN AEROBIC AND ANAEROBIC 5CC EA  Final   Culture   Final           BLOOD CULTURE RECEIVED NO GROWTH TO DATE CULTURE WILL BE HELD FOR 5 DAYS BEFORE ISSUING A FINAL NEGATIVE REPORT Performed at [facility]    Report  Status PENDING  Incomplete  Culture, blood (routine x 2)     Status: None (Preliminary result)   Collection Time: 03/19/15  9:11 PM  Result Value Ref Range Status   Specimen Description BLOOD RIGHT HAND  Final   Special Requests BOTTLES DRAWN AEROBIC ONLY 10CC  Final   Culture   Final           BLOOD CULTURE RECEIVED NO GROWTH TO DATE CULTURE WILL BE HELD FOR 5 DAYS BEFORE ISSUING A FINAL NEGATIVE REPORT Performed at [facility]    Report Status PENDING  Incomplete  MRSA PCR Screening     Status: None   Collection Time: 03/20/15  3:35 PM  Result Value Ref Range Status   MRSA by PCR NEGATIVE NEGATIVE Final    Comment:        The GeneXpert MRSA Assay (FDA approved for  NASAL specimens only), is one component of a comprehensive MRSA colonization surveillance program. It is not intended to diagnose MRSA infection nor to guide or monitor treatment for MRSA infections.        Scheduled Meds:  Scheduled Meds: . aspirin EC  81 mg Oral Daily  . atorvastatin  10 mg Oral q1800  . FLUoxetine  20 mg Oral Daily  . insulin aspart  0-9 Units Subcutaneous TID WC  . insulin aspart protamine- aspart  10 Units Subcutaneous BID    Time spent on care of this patient: 35 mins  Eulogio Bear DO Contact MD directly via text page:      [email or web address]      password [facility]  03/22/2015, 10:24 AM   LOS: 3 days

## 2015-03-22 NOTE — Evaluation (Signed)
Physical Therapy Evaluation Patient Details Name: [name] [name] MRN: [id number] DOB: [birth year] Today's Date: 03/22/2015   History of Present Illness  [name] [name] is an 69 y.o. male with diabetes who was noted to be confused and lethargic. 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  Clinical Impression  Pt admitted with above complications. Pt currently with functional limitations due to the deficits listed below (see PT Problem List). Ambulates fairly well without an assistive device. Mild balance deficits noted however patient able to self correct while ambulating. Difficulty with higher level balance challenges. Would greatly benefit from follow up outpatient physical therapy which he is agreeable to. Plans to stay with sister at d/c who can provide assistance as needed. Will follow and progress patient's functional independence and safety while admitted.   Follow Up Recommendations Outpatient PT;Supervision for mobility/OOB    Equipment Recommendations  None recommended by PT    Recommendations for Other Services       Precautions / Restrictions Precautions Precautions: None Restrictions Weight Bearing Restrictions: No      Mobility  Bed Mobility Overal bed mobility: Modified Independent             General bed mobility comments: extra time  Transfers Overall transfer level: Needs assistance Equipment used: None Transfers: Sit to/from Stand Sit to Stand: Supervision         General transfer comment: supervision for safety. Mild sway noted and slower to rise however no physical assist required.  Ambulation/Gait Ambulation/Gait assistance: Supervision Ambulation Distance (Feet): 175 Feet Assistive device: None Gait Pattern/deviations: Step-through pattern;Decreased stride length;Drifts right/left;Narrow base of support Gait velocity: decreased    General Gait Details: slightly guarded with mild sway noted during gait. No overt loss of balance requiring physical assist. Declines using a cane. Some balance difficulty with turns. Steps backwards safely. VC for awareness of surroundings and mild instability.   Stairs            Wheelchair Mobility    Modified Rankin (Stroke Patients Only) Modified Rankin (Stroke Patients Only) Pre-Morbid Rankin Score: No symptoms Modified Rankin: Moderately severe disability     Balance Overall balance assessment: Needs assistance Sitting-balance support: No upper extremity supported;Feet supported Sitting balance-Leahy Scale: Good     Standing balance support: No upper extremity supported Standing balance-Leahy Scale: Fair   Single Leg Stance - Right Leg: 0 Single Leg Stance - Left Leg: 0     Rhomberg - Eyes Opened: 1 Rhomberg - Eyes Closed: 0.5                 Pertinent Vitals/Pain Pain Assessment: No/denies pain    Home Living Family/patient expects to be discharged to:: Private residence Living Arrangements: Parent Available Help at Discharge: Family;Available 24 hours/day Type of Home: House Home Access: Level entry     Home Layout: One level Home Equipment: None Additional Comments: does not drive    Prior Function Level of Independence: Independent               Hand Dominance   Dominant Hand: Right    Extremity/Trunk Assessment   Upper Extremity Assessment: Defer to OT evaluation (mild dysmetria with LUE FNF test)           Lower Extremity Assessment: Overall WFL for tasks assessed      Cervical / Trunk Assessment: Normal  Communication   Communication: No difficulties  Cognition Arousal/Alertness: Awake/alert Behavior During  Therapy: WFL for tasks assessed/performed Overall Cognitive Status: Within Functional Limits for tasks assessed                      General Comments General comments (skin integrity, edema, etc.):  Reviewed signs/symptoms of stroke including FAST acronym. Discussed safety with mobility and follow up recommendations for gait/balance training.    Exercises        Assessment/Plan    PT Assessment Patient needs continued PT services  PT Diagnosis Difficulty walking;Abnormality of gait   PT Problem List Decreased activity tolerance;Decreased balance;Decreased mobility;Decreased coordination;Decreased knowledge of use of DME  PT Treatment Interventions DME instruction;Gait training;Functional mobility training;Therapeutic activities;Therapeutic exercise;Balance training;Neuromuscular re-education   PT Goals (Current goals can be found in the Care Plan section) Acute Rehab PT Goals Patient Stated Goal: Go to outpatient therapy PT Goal Formulation: With patient Time For Goal Achievement: 04/05/15 Potential to Achieve Goals: Good    Frequency Min 4X/week   Barriers to discharge        Co-evaluation               End of Session   Activity Tolerance: Patient tolerated treatment well Patient left: in bed;with call bell/phone within reach;with bed alarm set Nurse Communication: Mobility status         Time: 1100-1115 PT Time Calculation (min) (ACUTE ONLY): 15 min   Charges:   PT Evaluation $Initial PT Evaluation Tier I: 1 Procedure     PT G CodesEllouise Newer 03/22/2015, 12:08 PM Elayne Snare, [place]

## 2015-03-22 NOTE — Care Management Note (Signed)
Case Management Note  Patient Details  Name: [name] [name] MRN: [id number] Date of Birth: 1946-03-29  Subjective/Objective:                    Action/Plan: Received a call from bedside RN stating that patient is agreeable to home health and would like to use [facility].  Miranda with [facility] was notified and has accepted the referral for probable discharge home today.  Expected Discharge Date:                  Expected Discharge Plan:  [place]  In-House Referral:     Discharge planning Services  CM Consult  Post Acute Care Choice:    Choice offered to:  Patient  DME Arranged:    DME Agency:     HH Arranged:  PT, RN, Disease Management [place] Agency:  [place]  Status of Service:  Completed, signed off  Medicare Important Message Given:  Yes Date Medicare IM Given:  03/22/15 Medicare IM give by:  Lorne Skeens RN, MSN, CM Date Additional Medicare IM Given:    Additional Medicare Important Message give by:     If discussed at [place] of Stay Meetings, dates discussed:    Additional Comments:  Rolm Baptise, RN 03/22/2015, 3:27 PM

## 2015-03-22 NOTE — Care Management Note (Signed)
Case Management Note  Patient Details  Name: Kamonte Mcmichen MRN: [id number] Date of Birth: Feb 18, 1946  Subjective/Objective:                    Action/Plan: Spoke with Dr Eliseo Squires, who has placed orders for HHPT and RN for disease management.  CM met with patient to discuss home health and presented a list of agencies.  Patient states he is unsure about having someone come to his home because it is "crowded", and he would like to discuss it with his sister first.  Patient was instructed to let staff know when he has made a decision so CM can be alerted.  Will continue to follow.  Expected Discharge Date:                  Expected Discharge Plan:  [place]  In-House Referral:     Discharge planning Services  CM Consult  Post Acute Care Choice:    Choice offered to:     DME Arranged:    DME Agency:     HH Arranged:    [place] Agency:     Status of Service:  In process, will continue to follow  Medicare Important Message Given:  Yes Date Medicare IM Given:  03/22/15 Medicare IM give by:  Lorne Skeens RN, MSN, CM Date Additional Medicare IM Given:    Additional Medicare Important Message give by:     If discussed at [place] of Stay Meetings, dates discussed:    Additional Comments:  Rolm Baptise, RN 03/22/2015, 2:41 PM

## 2015-03-22 NOTE — Progress Notes (Signed)
STROKE TEAM PROGRESS NOTE   HISTORY [name] [name] is an 69 y.o. male with diabetes who was noted to be confused and lethargic. Patient states he ran out of his insulin in his insulin pump and started using his sisters short acting insulin. On arrival to ED he was noted to have a temperature of 101, BG 1298, Cr 5.44, WBC 11.1. Patient was hydrated and started on empiric Vancomycin and Zosyn for possible sepsis. UC and BC pending. MRI was obtained while in ED due to continued confusion. Patient was not administered TPA secondary to non-focal presentation, stroke not suspected. 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 Neurology was consulted due to these findings.   Patient will be admitted to hospital for further evaluation of Sepsis (blood culture and urine cultures pending) and stroke work up. Currently patient has no complaints.    SUBJECTIVE (INTERVAL HISTORY) No complaints. Sitting in chair comfortably. Stated that he feels much better.   OBJECTIVE Temp:  [98.4 F (36.9 C)-99.3 F (37.4 C)] 98.8 F (37.1 C) (06/09 1001) Pulse Rate:  [58-88] 62 (06/09 1001) Cardiac Rhythm:  [-] Normal sinus rhythm (06/09 0811) Resp:  [13-18] 18 (06/09 1001) BP: (149-165)/(72-99) 153/72 mmHg (06/09 1001) SpO2:  [98 %-100 %] 99 % (06/09 1001)   Recent Labs Lab 03/21/15 0745 03/21/15 1235 03/21/15 1603 03/21/15 1740 03/22/15 0801  GLUCAP 189* 279* 230* 260* 124*    Recent Labs Lab 03/20/15 0148 03/20/15 0740 03/20/15 1000 03/20/15 1331 03/21/15 0530 03/22/15 0446  NA 135 141 142  --  140 139  K 3.8 3.6 3.8  --  4.0 3.2*  CL 107 112* 113*  --  111 108  CO2 18* 19* 17*  --  19* 22  GLUCOSE 681* 206* 116*  --  161* 103*  BUN 66* 59* 54*  --  36* 16  CREATININE 4.26* 3.42* 3.18* 3.00* 1.97* 1.05  CALCIUM 7.4* 7.8* 7.7*  --  8.0* 8.2*    Recent Labs Lab 03/19/15 2008 03/20/15 0740 03/20/15 1000  AST 25 21  --   ALT 11* 11*  --   ALKPHOS 86 64   --   BILITOT 1.3* 0.3  --   PROT 6.2* 5.5*  --   ALBUMIN 3.5 2.9* 3.0*    Recent Labs Lab 03/19/15 2008 03/20/15 0740 03/20/15 1331 03/22/15 0446  WBC 11.1* 11.1* 12.1* 6.3  NEUTROABS 9.4* 8.3*  --   --   HGB 10.4* 10.6* 10.7* 10.1*  HCT 35.6* 31.9* 31.7* 29.5*  MCV 100.6* 87.4 88.1 87.8  PLT 165 137* 128* 88*    Recent Labs Lab 03/20/15 1000 03/20/15 1550 03/20/15 2020  TROPONINI 0.75* 0.77* 0.72*    Recent Labs  03/20/15 1331  LABPROT 13.9  INR 1.05    Recent Labs  03/19/15 1905  COLORURINE YELLOW  LABSPEC 1.023  PHURINE 5.0  GLUCOSEU >1000*  HGBUR NEGATIVE  BILIRUBINUR NEGATIVE  KETONESUR 15*  PROTEINUR 30*  UROBILINOGEN 0.2  NITRITE NEGATIVE  LEUKOCYTESUR NEGATIVE       Component Value Date/Time   CHOL 148 03/21/2015 0530   TRIG 139 03/21/2015 0530   HDL 43 03/21/2015 0530   CHOLHDL 3.4 03/21/2015 0530   VLDL 28 03/21/2015 0530   LDLCALC 77 03/21/2015 0530   Lab Results  Component Value Date   HGBA1C 10.6* 03/21/2015      Component Value Date/Time   LABOPIA POSITIVE* 03/19/2015 1905   COCAINSCRNUR NONE DETECTED 03/19/2015 1905  LABBENZ NONE DETECTED 03/19/2015 1905   AMPHETMU NONE DETECTED 03/19/2015 1905   THCU NONE DETECTED 03/19/2015 1905   LABBARB NONE DETECTED 03/19/2015 1905    No results for input(s): ETH in the last 168 hours.   MRI brain 1. Subtle 1 cm acute ischemic infarct within the right thalamus. No associated hemorrhage or mass effect. 2. No other acute intracranial process. 3. Atrophy with mild chronic small vessel ischemic disease. 4. 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   [name] [name] Head Wo Contrast 03/20/2015    Motion degraded study. No significant intracranial stenosis or occlusion.     US Renal 03/20/2015    Left renal cysts.  No acute abnormality noted.     Dg Chest Port 1 View 03/20/2015    Left IJ approach central line placed  with tip at the level of the SVC. Curved configuration of the tip might indicate extension into the proximal azygos vein. A lateral view of the chest should confirm. Otherwise no adverse features.     Dg Chest Port 1 View 03/20/2015     No evidence of pneumothorax or pleural effusion.     2D Echocardiogram   - Left ventricle: The cavity size was normal. Wall thickness wasnormal. Systolic function was normal. The estimated ejectionfraction was in the range of 60% to 65%. Wall motion was normal;there were no regional wall motion abnormalities. Leftventricular diastolic function parameters were normal. - Right ventricle: The cavity size was mildly dilated. - Atrial septum: No defect or patent foramen ovale was identified. - Tricuspid valve: There was moderate regurgitation.  Carotid Doppler  There is 1-39% bilateral ICA stenosis. Vertebral artery flow is antegrade.     Physical exam: Exam: Gen: NAD Eyes: anicteric sclerae, moist conjunctivae                    CV: no MRG, no carotid bruits, no peripheral edema Mental Status: Alert, follows commands, good historian Neuro: Detailed Neurologic Exam Speech:    No aphasia, no dysarthria Cranial Nerves:    The pupils are equal, round, and reactive to light.. Attempted, Fundi not visualized.  EOMI. No gaze preference. Visual fields full. Mild right nasolabial fold flattening, Tongue midline. Hearing intact to voice. Shoulder shrug intact Motor Observation:    no involuntary movements noted. Tone appears normal.  Strength:    5/5 Sensation:  Intact to LT Plantars downgoing.  Gait: Unable to test   ASSESSMENT/PLAN [name] [name] is a 69 y.o. male with history of diabetes mellitus, chronic kidney disease, hypertension, chronic back pain presenting with generalized weakness and lethargy. He did not receive IV t-PA due to stroke not suspected.   Stroke:  Incidental right thalamic infarct secondary to  small vessel disease  source  Resultant  R nasal labial flattening, likely baseline  MRI  R thalamic infarct. small vessel disease   MRA  No significant stenosis   Carotid Doppler  No significant stenosis   2D Echo  No source of embolus   LDL 77  HgbA1c 10.6, not at goal  Heparin 5000 units sq tid for VTE prophylaxis Diet Carb Modified Fluid consistency:: Thin; Room service appropriate?: Yes  no antithrombotic prior to admission, now on no antithrombotic. Will add aspirin 81 mg daily.  Patient counseled to be compliant with his antithrombotic medications  Ongoing aggressive stroke risk factor management  Therapy recommendations:  OP PT  Disposition:  Home with OP therapies  NO FURTHER STROKE WORKUP INDICATED  Patient has a 10-15% risk of having another stroke over the next year, the highest risk is within 2 weeks of the most recent stroke/TIA (risk of having a stroke following a stroke or TIA is the same).  Ongoing risk factor control by Primary Care Physician  Stroke Service will sign off. Please call should any needs arise.  Follow-up Stroke Clinic at [facility] with Dr. Rosalin Hawking in 2 months, order placed.   Cervical Spine Stenosis  MRI - incompletely evaluated Advanced degenerative disc disease within the partially visualized upper cervical spine  No further workup indicated per Dr. Jaynee Eagles  Hypertension  Home meds:   altace  Hypotensive episode, resolved  Hyperlipidemia  Home meds:  No statin  LDL 77, goal < 70  On lipitor 10  Continue statin at discharge  Diabetes type 2 uncontrolled  HgbA1c 10.6, goal < 7.0  Other Stroke Risk Factors  Advanced age  Former Cigarette smoker, quit smoking 2 years ago   ETOH use  Other Active Problems  Acute encephalopathy, possible sepsis, multifactorial. On vacomycin and zosyn  Hypocalcemia  Acute on chronic kidney diease  Fall  Other Pertinent History  Hx Seizures  Hospital day #  [place] for Pager information 03/22/2015 10:36 AM   I, the attending vascular neurologist, have personally obtained a history, examined the patient, evaluated laboratory data, individually viewed imaging studies and agree with radiology interpretations. Together with the NP/PA, we formulated the assessment and plan of care which reflects our mutual decision.  I have made any additions or clarifications directly to the above note and agree with the findings and plan as currently documented.   Neurology will sign off. Please call with questions. Pt will follow up with Dr. Erlinda Hong at [facility] in about 2 months. Thanks for the consult.   Rosalin Hawking, MD PhD Stroke Neurology 03/23/2015 6:15 AM       To contact Stroke Continuity provider, please refer to [email or web address] After hours, contact General Neurology

## 2015-03-22 NOTE — Progress Notes (Signed)
Inpatient Diabetes Program Recommendations  AACE/ADA: New Consensus Statement on Inpatient Glycemic Control (2013)  Target Ranges:  Prepandial:   less than 140 mg/dL      Peak postprandial:   less than 180 mg/dL (1-2 hours)      Critically ill patients:  140 - 180 mg/dL     Thank you Rosita Kea, RN, MSN, CDE  Diabetes Inpatient Program Office: [phone] Pager: [phone] 8:00 am to 5:00 pm

## 2015-03-23 DIAGNOSIS — E872 Acidosis: Secondary | ICD-10-CM

## 2015-03-23 LAB — GLUCOSE, CAPILLARY
Glucose-Capillary: 139 mg/dL — ABNORMAL HIGH (ref 65–99)
Glucose-Capillary: 172 mg/dL — ABNORMAL HIGH (ref 65–99)
Glucose-Capillary: 305 mg/dL — ABNORMAL HIGH (ref 65–99)

## 2015-03-23 LAB — CBC
HCT: 30.6 % — ABNORMAL LOW (ref 39.0–52.0)
HCT: 31.1 % — ABNORMAL LOW (ref 39.0–52.0)
Hemoglobin: 10.4 g/dL — ABNORMAL LOW (ref 13.0–17.0)
Hemoglobin: 10.5 g/dL — ABNORMAL LOW (ref 13.0–17.0)
MCH: 29.3 pg (ref 26.0–34.0)
MCH: 29 pg (ref 26.0–34.0)
MCHC: 34 g/dL (ref 30.0–36.0)
MCHC: 33.8 g/dL (ref 30.0–36.0)
MCV: 86.2 fL (ref 78.0–100.0)
MCV: 85.9 fL (ref 78.0–100.0)
Platelets: UNDETERMINED 10*3/uL (ref 150–400)
Platelets: 81 10*3/uL — ABNORMAL LOW (ref 150–400)
RBC: 3.55 MIL/uL — ABNORMAL LOW (ref 4.22–5.81)
RBC: 3.62 MIL/uL — ABNORMAL LOW (ref 4.22–5.81)
RDW: 12.9 % (ref 11.5–15.5)
RDW: 12.7 % (ref 11.5–15.5)
WBC: 5.5 10*3/uL (ref 4.0–10.5)
WBC: 6.3 10*3/uL (ref 4.0–10.5)

## 2015-03-23 MED ORDER — ALPRAZOLAM 1 MG PO TABS: 1.0000 mg | ORAL_TABLET | Freq: Two times a day (BID) | ORAL | Status: DC | PRN

## 2015-03-23 MED ORDER — ATORVASTATIN CALCIUM 10 MG PO TABS: 10.0000 mg | ORAL_TABLET | Freq: Every day | ORAL | Status: DC

## 2015-03-23 MED ORDER — INSULIN ASPART PROT & ASPART (70-30 MIX) 100 UNIT/ML ~~LOC~~ SUSP: 15.0000 [IU] | Freq: Two times a day (BID) | SUBCUTANEOUS | Status: DC

## 2015-03-23 MED ORDER — ASPIRIN 81 MG PO TBEC: 81.0000 mg | DELAYED_RELEASE_TABLET | Freq: Every day | ORAL | Status: DC

## 2015-03-23 NOTE — Progress Notes (Signed)
D/C orders received, pt for D/C home today with Home Health.  IV and telemetry D/C.  Rx and D/C instructions given with verbalized understanding.  Family at bedside to assist with D/C.  Staff brought pt downstairs via wheelchair.

## 2015-03-23 NOTE — Progress Notes (Signed)
Per MD order, central line removed. IV cathter intact. Vaseline pressure gauze to site, pressure held x 5 min, no bleeding to site. Pt instructed not to get out of bed for 30 min after the removal of the central line. Instucted to keep dressing CDI x 24hours, if bleeding occurs hold pressure, if bleeding does not stop contact MD or go to the ED. Pt verbalized understanding and did not have any questions. [name] [name] M  

## 2015-03-23 NOTE — Progress Notes (Signed)
Physical Therapy Treatment Patient Details Name: [name] [name] MRN: [id number] DOB: 1946/05/20 Today's Date: 03/23/2015    History of Present Illness [name] [name] is an 69 y.o. male with diabetes who was noted to be confused and lethargic. 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    PT Comments    Continues to make good progress towards physical goals. Tolerated dynamic gait challenges today with mild balance difficulties but no overt loss of balance. States his sister will take him to out patient physical therapy clinic to further progress his balance and safety with mobility. Pt feels confident with his abilities and was able to accurately recall signs/symptoms of stroke today. Adequate for d/c from a mobility standpoint when medically ready.  Follow Up Recommendations  Outpatient PT;Supervision for mobility/OOB     Equipment Recommendations  None recommended by PT    Recommendations for Other Services       Precautions / Restrictions Precautions Precautions: None Restrictions Weight Bearing Restrictions: No    Mobility  Bed Mobility Overal bed mobility: Modified Independent                Transfers Overall transfer level: Needs assistance Equipment used: None Transfers: Sit to/from Stand Sit to Stand: Supervision         General transfer comment: supervision for safety. min sway, able to self correct.  Ambulation/Gait Ambulation/Gait assistance: Supervision Ambulation Distance (Feet): 300 Feet Assistive device: None Gait Pattern/deviations: Step-through pattern;Decreased stride length;Drifts right/left Gait velocity: decreased   General Gait Details: Tolerated dynamic gait challenges with minimal balance difficulty noted. Included high marching, variable speeds, vertical/horizontal head turns, quick turns, and backwards stepping. Improved  stability from previous therapy session.   Stairs            Wheelchair Mobility    Modified Rankin (Stroke Patients Only) Modified Rankin (Stroke Patients Only) Pre-Morbid Rankin Score: No symptoms Modified Rankin: Moderately severe disability     Balance                                    Cognition Arousal/Alertness: Awake/alert Behavior During Therapy: WFL for tasks assessed/performed Overall Cognitive Status: Within Functional Limits for tasks assessed                      Exercises      General Comments General comments (skin integrity, edema, etc.): Recalls stroke symptoms with FAST acronym accurately.      Pertinent Vitals/Pain Pain Assessment: No/denies pain    Home Living                      Prior Function            PT Goals (current goals can now be found in the care plan section) Acute Rehab PT Goals Patient Stated Goal: Go to outpatient therapy PT Goal Formulation: With patient Time For Goal Achievement: 04/05/15 Potential to Achieve Goals: Good Progress towards PT goals: Progressing toward goals    Frequency  Min 4X/week    PT Plan Current plan remains appropriate    Co-evaluation             End of Session   Activity Tolerance: Patient tolerated treatment well Patient left: in bed;with call bell/phone within reach     Time: 0921-0933 PT Time Calculation (  min) (ACUTE ONLY): 12 min  Charges:  $Gait Training: 8-22 mins                    G Codes:      Ellouise Newer 28-Mar-2015, 9:48 AM [place], [place]

## 2015-03-23 NOTE — Discharge Summary (Signed)
Physician Discharge Summary  [name] [name] [id number] DOB: November 01, 1945 DOA: 03/19/2015  PCP: Guadlupe Spanish, MD  Admit date: 03/19/2015 Discharge date: 03/24/2015  Time spent: 35 minutes  Recommendations for Outpatient Follow-up:  1. Home health- RN PT 2. Monitor blood sugars and bring to PCP  Discharge Diagnoses:  Active Problems:   Anemia of chronic disease   Fall   HTN (hypertension)   Lactic acidosis   Encephalopathy acute   Acute-on-chronic kidney injury   Stroke   Type 2 diabetes mellitus with ketoacidosis without coma   Acute encephalopathy   Hypocalcemia   Other specified hypotension   Encounter for central line placement   Thalamic infarct, acute   Discharge Condition: improved  Diet recommendation: cardiac/diabetic  Filed Weights   03/19/15 2127 03/20/15 1026 03/20/15 1534  Weight: 72.576 kg (160 lb) 71.215 kg (157 lb) 71.7 kg (158 lb 1.1 oz)    History of present illness:  [name] [name] is a 69 y.o. male with Past medical history of diabetes mellitus, chronic kidney disease, hypertension, chronic back pain. The patient is presenting with complaints of generalized weakness and lethargy that started today. The history was obtained from patient's sister with whom the patient has been living since last one month. The patient was at his baseline earlier in the morning as per patient's sister and when she left at 11:00 he was sitting in the recliner. When she comes back from the work patient reports a fall during the day and has been confused and lethargic. No fever no chills no nausea no vomiting no diarrhea reported. Patient has ran out of his insulin for last 2 days and is currently using his sister's short-acting insulin which she uses for her insulin pump. Patient has also ran out of his other medications. Patient has been drowsy and lethargic but denies any complaints of headache, dizziness, vision changes, chest pain, abdominal pain, any other focal  deficit.  The patient is coming from home. And at his baseline independent for most of his ADL.   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 Wall thickness was normal. Systolic function was normal. The estimated ejection fraction was in the range of 60% to 65%. Wall motion was normal; there were no regional wall motion abnormalities. Left ventricular diastolic function parameters were normal. - Right ventricle: The cavity size was mildly dilated. - Atrial septum: No defect or patent foramen ovale was identified. - Tricuspid valve: There was moderate regurgitation. Patient with ongoing insulin titration for better blood sugar control- HgbA1C- 10.6 -PT/OT consults -LDL 77  Aute Encephalopathy / SIRS (not sepsis) -multifactorial to include DKA, acute stroke, hypotension, and uremia -no evidence of acute infection, therefore abx being d/c  -mental status has returned to baseline w/ correction of above issues   Hypocalcemia -corrected calcium 8.8 - follow w/o tx today in setting of aggressive volume expansion   Hypotension -resolved w/ volume resuscitation - most c/w DH due to DKA / osmotic diuresis   AKI on chronic kidney disease stage 3 -secondary to DKA and dehydration - rapidly improving w/ volume expansion  -Renal ultrasound w/o acute findings  -baseline crt ~1.5 / GFR  52  Fall -Most likely multifactorial to include DKA, acute stroke, hypotension, and uremia -Patient does not appear to have an acute fracture or c/o focal pain  -PT/OT to see   Mildly elevated troponin -The patient is asymptomatic and the troponin has remained flat most consistent  with acute renal failure and hypotension related myocardial stress  Procedures:    Consultations:  neuro  Discharge Exam: Filed Vitals:   03/23/15 1016  BP: 186/80  Pulse: 76  Temp: 98.1 F (36.7 C)  Resp: 20    General: A+Ox3, NAD- demanding to leave due to Dr. appt or will leave [facility]   Discharge Instructions   Discharge Instructions    Ambulatory referral to Neurology    Complete by:  As directed   Dr. Erlinda Hong requests followup in 2 months     Diet - low sodium heart healthy    Complete by:  As directed      Diet Carb Modified    Complete by:  As directed      Discharge instructions    Complete by:  As directed   Monitor blood sugars- alternate before breakfast and 2 hours after Cbc, bmp 1 week     Increase activity slowly    Complete by:  As directed           Discharge Medication List as of 03/23/2015 11:49 AM    START taking these medications   Details  aspirin EC 81 MG EC tablet Take 1 tablet (81 mg total) by mouth daily., Starting 03/23/2015, Until Discontinued, Print    atorvastatin (LIPITOR) 10 MG tablet Take 1 tablet (10 mg total) by mouth daily at 6 PM., Starting 03/23/2015, Until Discontinued, Print      CONTINUE these medications which have CHANGED   Details  ALPRAZolam (XANAX) 1 MG tablet Take 1 tablet (1 mg total) by mouth 2 (two) times daily as needed for anxiety., Starting 03/23/2015, Until Discontinued, No Print    insulin aspart protamine- aspart (NOVOLOG MIX 70/30) (70-30) 100 UNIT/ML injection Inject 0.15 mLs (15 Units total) into the skin 2 (two) times daily., Starting 03/23/2015, Until Discontinued, Print      CONTINUE these medications which have NOT CHANGED   Details  FLUoxetine (PROZAC) 20 MG capsule Take 20 mg by mouth daily., Until Discontinued, Historical Med    oxycodone (ROXICODONE) 30 MG immediate release tablet Take 30 mg by mouth every 4 (four) hours as needed for pain. , Until Discontinued, Historical Med    ramipril (ALTACE) 10  MG capsule Take 10 mg by mouth every morning., Until Discontinued, Historical Med      STOP taking these medications     insulin glargine (LANTUS) 100 UNIT/ML injection        No Known Allergies Follow-up Information    Follow up with Xu,Jindong, MD. Schedule an appointment as soon as possible for a visit in 2 months.   Specialty:  Neurology   Why:  Stroke Clinic, Office will call you with appointment date & time   Contact information:   [address] [place] [address] [phone]       Follow up with Guadlupe Spanish, MD In 1 week.   Specialty:  Internal Medicine   Contact information:   [place] [phone]        The results of significant diagnostics from this hospitalization (including imaging, microbiology, ancillary and laboratory) are listed below for reference.    Significant Diagnostic Studies:  Dg Lumbar Spine Complete  03/19/2015   CLINICAL DATA:  Altered mental status. [place] earlier today in his assisted living home.  EXAM: LUMBAR SPINE - COMPLETE 4+ VIEW  COMPARISON:  None.  FINDINGS: There is no evidence of lumbar spine fracture. Alignment is normal. Degenerative changes at L4-5 and L5-S1 chronic, with disc space narrowing, and slight facet mediated slip at L4-5. No worrisome osseous lesions. New RIGHT hip replacement appears uncomplicated.  IMPRESSION: Degenerative disc disease.  No acute abnormality.   Electronically Signed   By: Rolla Flatten M.D.   On: 03/19/2015 23:33   Ct Head Wo Contrast  03/19/2015   CLINICAL DATA:  Recent fall, altered mental status  EXAM: CT HEAD WITHOUT CONTRAST  TECHNIQUE: Contiguous axial images were obtained from the base of the skull through the vertex without intravenous contrast.  COMPARISON:  03/19/2014  FINDINGS: Bony calvarium is intact. The paranasal sinuses and mastoid air cells are well aerated. Mild atrophic changes are seen. No findings to suggest acute hemorrhage, acute  infarction or space-occupying mass lesion are noted.  IMPRESSION: Chronic atrophic changes without acute abnormality.   Electronically Signed   By: Inez Catalina M.D.   On: 03/19/2015 22:00   [name]  03/20/2015   CLINICAL DATA:  Stroke.  Right thalamic infarct.  Diabetes.  EXAM: MRA HEAD WITHOUT CONTRAST  TECHNIQUE: Angiographic images of the Circle of Willis were obtained using MRA technique without intravenous contrast.  COMPARISON:  MRI head 03/20/2015  FINDINGS: Both vertebral arteries patent to the basilar bilaterally. Mild stenosis distal right vertebral artery. Basilar is tortuous but patent. Superior cerebellar and posterior cerebral arteries patent bilaterally without significant stenosis. PICA patent bilaterally  Internal carotid artery patent bilaterally without significant stenosis. Tortuous intracranial circulation without significant stenosis.  Negative for cerebral aneurysm.  Image quality degraded by patient motion  IMPRESSION: Motion degraded study. No significant intracranial stenosis or occlusion.   Electronically Signed   By: Franchot Gallo M.D.   On: 03/20/2015 21:28   Mr Brain Wo Contrast  03/20/2015   CLINICAL DATA:  Initial evaluation for acute encephalopathy.  EXAM: MRI HEAD WITHOUT CONTRAST  TECHNIQUE: Multiplanar, multiecho pulse sequences of the brain and surrounding structures were obtained without intravenous contrast.  COMPARISON:  Prior CT from 03/19/2015.  FINDINGS: Diffuse prominence of the CSF containing spaces is compatible with generalized age-related atrophy. Patchy and confluent T2/FLAIR hyperintensity within the periventricular and deep white matter both cerebral hemispheres most consistent with chronic small vessel ischemic disease, fairly mild for patient age. Similar changes seen within the pons.  There is a small 1 cm focus of restricted diffusion within the lateral aspect of the right thalamus, consistent 4 an acute ischemic infarct (series 3, image  23). Corresponding signal dropout seen on ADC map. No significant mass effect. No associated hemorrhage. No other infarct identified.  No mass lesion, mass effect, or midline shift. No hydrocephalus. No extra-axial fluid collection.  Craniocervical junction within normal limits. Reversal of the normal cervical lordosis with advanced degenerative disc disease present within the partially visualized upper cervical spine. There is associated mild to moderate canal stenosis.  Incidental note made of a partially empty sella.  No acute abnormality about the orbits.  Mild mucosal thickening present within the left maxillary sinus. Small amount of layering opacity present within the right sphenoid sinus. Paranasal sinuses are otherwise clear. No mastoid effusion.  Inner ear structures normal.  Bone marrow signal intensity within normal limits. No scalp soft  tissue abnormality.  IMPRESSION: 1. Subtle 1 cm acute ischemic infarct within the right thalamus. No associated hemorrhage or mass effect. 2. No other acute intracranial process. 3. Atrophy with mild chronic small vessel ischemic disease. 4. 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 Finding is incompletely evaluated on this exam. This could be further characterized with dedicated MRI of the cervical spine as clinically desired.   Electronically Signed   By: Jeannine Boga M.D.   On: 03/20/2015 05:41   US Renal  03/20/2015   CLINICAL DATA:  Acute renal injury  EXAM: RENAL / URINARY TRACT ULTRASOUND COMPLETE  COMPARISON:  None.  FINDINGS: Right Kidney:  Length: 11.4 cm. Echogenicity within normal limits. No mass or hydronephrosis visualized.  Left Kidney:  Length: 11.7 Cm. A few small cysts are noted. The largest of these measures 9 mm in greatest dimension. No mass lesion or hydronephrosis is noted.  Bladder:  Appears normal for degree of bladder distention.  IMPRESSION: Left renal  cysts.  No acute abnormality noted.   Electronically Signed   By: Inez Catalina M.D.   On: 03/20/2015 09:10   Dg Chest Port 1 View  03/20/2015   CLINICAL DATA:  69 year old male status post central line placement. Initial encounter.  EXAM: PORTABLE CHEST - 1 VIEW  COMPARISON:  1328 hr today and earlier.  FINDINGS: Portable AP semi upright view at 1421 hrs. New left IJ approach central venous catheter. The tip projects just above the level of the carina, however has a curved configuration distally. No pneumothorax. Mediastinal contours remain normal. Stable ventilation.  IMPRESSION: Left IJ approach central line placed with tip at the level of the SVC. Curved configuration of the tip might indicate extension into the proximal azygos vein. A lateral view of the chest should confirm. Otherwise no adverse features.   Electronically Signed   By: Genevie Ann M.D.   On: 03/20/2015 14:30   Dg Chest Port 1 View  03/20/2015   CLINICAL DATA:  Status post attempted central line placement  EXAM: PORTABLE CHEST - 1 VIEW  COMPARISON:  03/19/2015  FINDINGS: Cardiac shadow is stable. The lungs are well aerated bilaterally. No pneumothorax is seen. No pleural effusion is noted. The bony structures are within normal limits.  IMPRESSION: No evidence of pneumothorax or pleural effusion.   Electronically Signed   By: Inez Catalina M.D.   On: 03/20/2015 13:57   Dg Chest Port 1 View  03/19/2015   CLINICAL DATA:  Fever for 1 day  EXAM: PORTABLE CHEST - 1 VIEW  COMPARISON:  03/19/2014  FINDINGS: The heart size and mediastinal contours are within normal limits. Both lungs are clear. The visualized skeletal structures are unremarkable.  IMPRESSION: No active disease.   Electronically Signed   By: Kathreen Devoid   On: 03/19/2015 19:40    Microbiology: Recent Results (from the past 240 hour(s))  Urine culture     Status: None   Collection Time: 03/19/15  7:05 PM  Result Value Ref Range Status   Specimen Description URINE, CATHETERIZED   Final   Special Requests NONE  Final   Colony Count NO GROWTH Performed at [facility]   Final   Culture NO GROWTH Performed at [facility]   Final   Report Status 03/21/2015 FINAL  Final  Culture, blood (routine x 2)     Status: None (Preliminary result)   Collection Time: 03/19/15  8:10 PM  Result  Value Ref Range Status   Specimen Description BLOOD  LEFT HAND  Final   Special Requests BOTTLES DRAWN AEROBIC AND ANAEROBIC 5CC EA  Final   Culture   Final           BLOOD CULTURE RECEIVED NO GROWTH TO DATE CULTURE WILL BE HELD FOR 5 DAYS BEFORE ISSUING A FINAL NEGATIVE REPORT Performed at [facility]    Report Status PENDING  Incomplete  Culture, blood (routine x 2)     Status: None (Preliminary result)   Collection Time: 03/19/15  9:11 PM  Result Value Ref Range Status   Specimen Description BLOOD RIGHT HAND  Final   Special Requests BOTTLES DRAWN AEROBIC ONLY 10CC  Final   Culture   Final           BLOOD CULTURE RECEIVED NO GROWTH TO DATE CULTURE WILL BE HELD FOR 5 DAYS BEFORE ISSUING A FINAL NEGATIVE REPORT Performed at [facility]    Report Status PENDING  Incomplete  MRSA PCR Screening     Status: None   Collection Time: 03/20/15  3:35 PM  Result Value Ref Range Status   MRSA by PCR NEGATIVE NEGATIVE Final    Comment:        The GeneXpert MRSA Assay (FDA approved for NASAL specimens only), is one component of a comprehensive MRSA colonization surveillance program. It is not intended to diagnose MRSA infection nor to guide or monitor treatment for MRSA infections.      Labs: Basic Metabolic Panel:  Recent Labs Lab 03/20/15 0148 03/20/15 0740 03/20/15 1000 03/20/15 1331 03/21/15 0530 03/22/15 0446  NA 135 141 142  --  140 139  K 3.8 3.6 3.8  --  4.0 3.2*  CL 107 112* 113*  --  111 108  CO2 18* 19* 17*  --  19* 22  GLUCOSE 681* 206* 116*  --  161* 103*  BUN 66* 59* 54*  --  36* 16  CREATININE 4.26* 3.42* 3.18* 3.00*  1.97* 1.05  CALCIUM 7.4* 7.8* 7.7*  --  8.0* 8.2*   Liver Function Tests:  Recent Labs Lab 03/19/15 2008 03/20/15 0740 03/20/15 1000  AST 25 21  --   ALT 11* 11*  --   ALKPHOS 86 64  --   BILITOT 1.3* 0.3  --   PROT 6.2* 5.5*  --   ALBUMIN 3.5 2.9* 3.0*   No results for input(s): LIPASE, AMYLASE in the last 168 hours. No results for input(s): AMMONIA in the last 168 hours. CBC:  Recent Labs Lab 03/19/15 2008 03/20/15 0740 03/20/15 1331 03/22/15 0446 03/23/15 0530 03/23/15 0657  WBC 11.1* 11.1* 12.1* 6.3 5.5 6.3  NEUTROABS 9.4* 8.3*  --   --   --   --   HGB 10.4* 10.6* 10.7* 10.1* 10.4* 10.5*  HCT 35.6* 31.9* 31.7* 29.5* 30.6* 31.1*  MCV 100.6* 87.4 88.1 87.8 86.2 85.9  PLT 165 137* 128* 88* PLATELET CLUMPS NOTED ON SMEAR, UNABLE TO ESTIMATE 81*   Cardiac Enzymes:  Recent Labs Lab 03/20/15 1000 03/20/15 1550 03/20/15 2020  TROPONINI 0.75* 0.77* 0.72*   BNP: BNP (last 3 results) No results for input(s): BNP in the last 8760 hours.  ProBNP (last 3 results) No results for input(s): PROBNP in the last 8760 hours.  CBG:  Recent Labs Lab 03/22/15 1326 03/22/15 1655 03/22/15 2148 03/23/15 0632 03/23/15 1106  GLUCAP 216* 131* 155* 172* 305*       Signed:  Nasean Zapf  [facility] 03/24/2015, 10:02 AM

## 2015-03-26 LAB — CULTURE, BLOOD (ROUTINE X 2)
Culture: NO GROWTH
Culture: NO GROWTH

## 2015-03-30 IMAGING — MR MR HEAD W/O CM
9 of 10 series · 34 of 48 positions shown · non-contrast
Comparison: 01/21/2014 CT.  No comparison MR.

CLINICAL DATA: New onset seizures. Low blood sugar. Diabetic
hypertensive patient

EXAM:
MRI HEAD WITHOUT CONTRAST
TECHNIQUE: Multiplanar, multiecho pulse sequences of the brain and surrounding
structures were obtained without intravenous contrast.

[Series 3: DWI · axial · 5.0mm · 1.09mm/px · z∈[-51,+92]mm · 6 of 60 slices shown (1 of 4)]
[im 1/60]
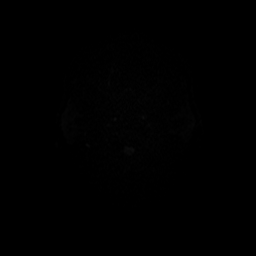
[im 12/60]
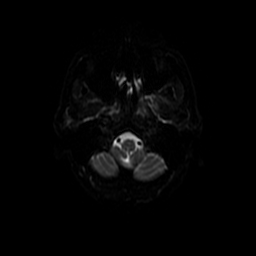
[im 24/60]
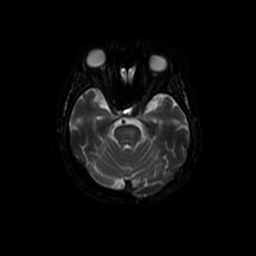
[im 36/60]
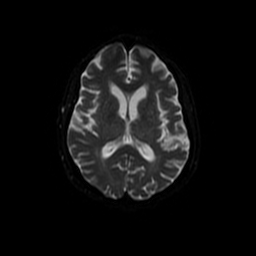
[im 48/60]
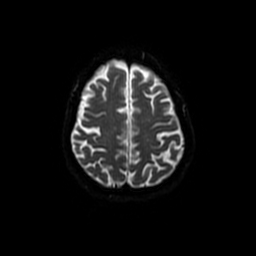
[im 60/60]
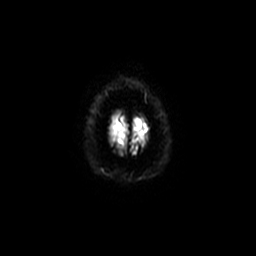

[Series 4: T2 · axial · 5.0mm · 0.43mm/px · z∈[-54,+85]mm · 2 of 21 slices shown (1 of 2)]
[im 1/21]
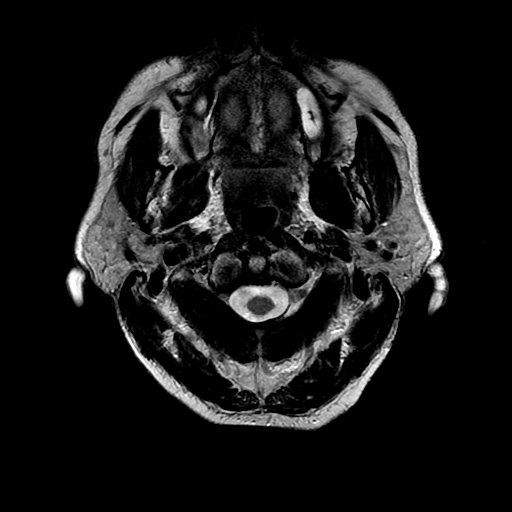
[im 21/21]
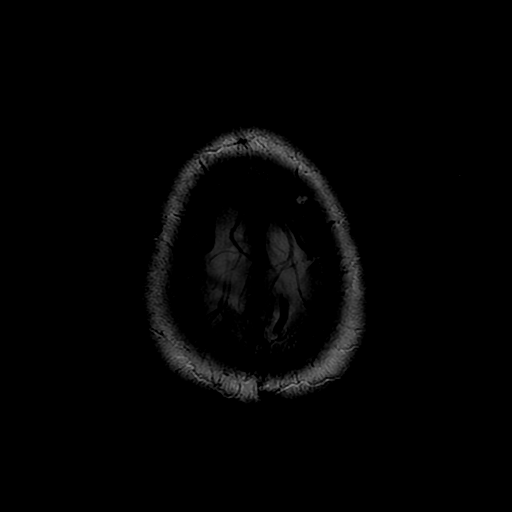

[Series 5: T1 · sagittal · 5.0mm · 0.47mm/px · 3 of 22 slices shown]
[im 1/22]
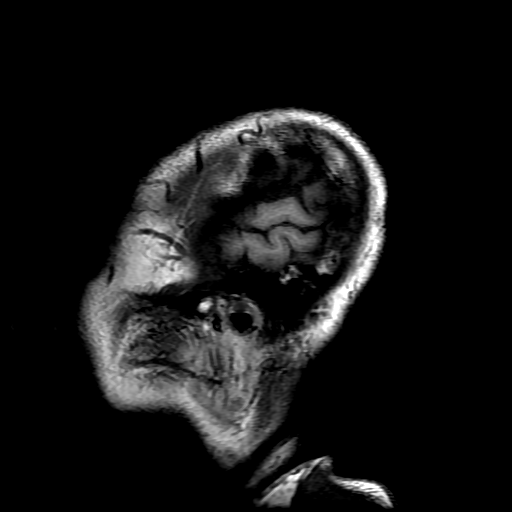
[im 11/22]
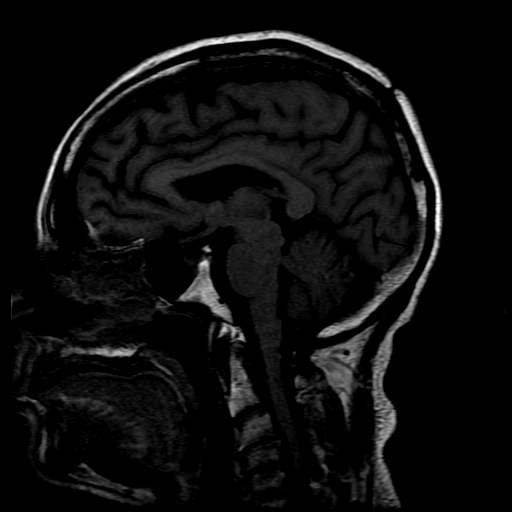
[im 22/22]
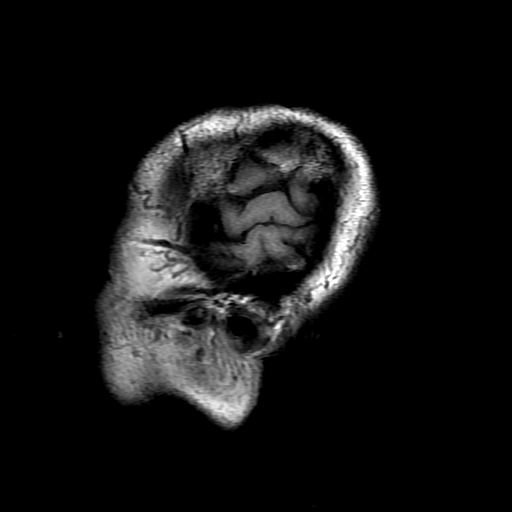

[Series 8: DWI · coronal · 5.0mm · 1.09mm/px · 8 of 64 slices shown (2 of 4)]
[im 1/64]
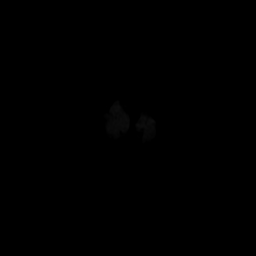
[im 10/64]
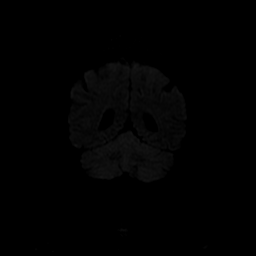
[im 19/64]
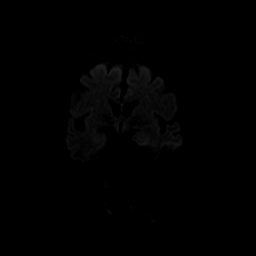
[im 28/64]
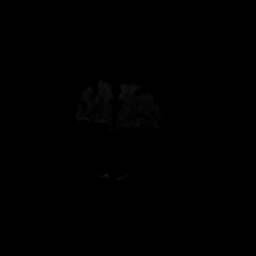
[im 37/64]
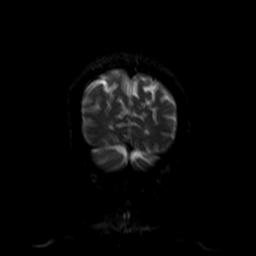
[im 46/64]
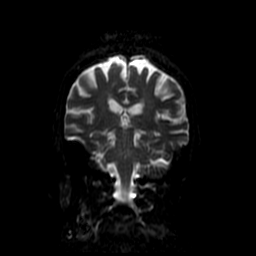
[im 55/64]
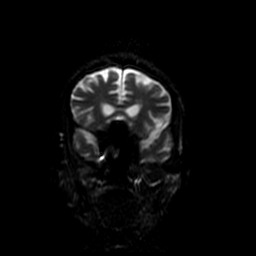
[im 64/64]
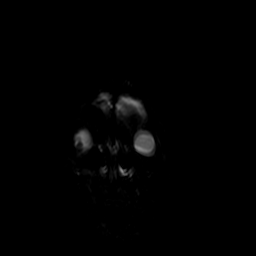

[Series 9: T2 · coronal · 3.0mm · 0.35mm/px · 3 of 25 slices shown (2 of 2)]
[im 1/25]
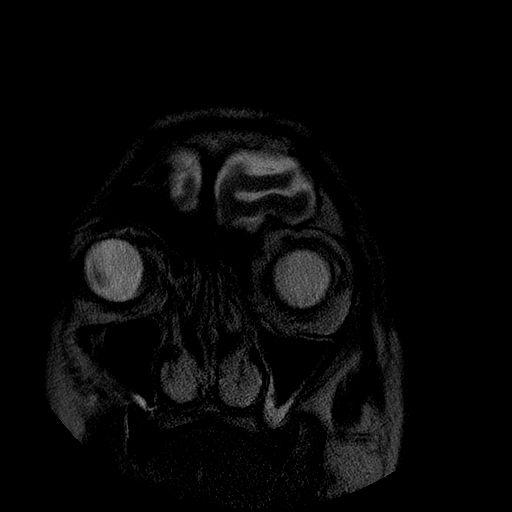
[im 13/25]
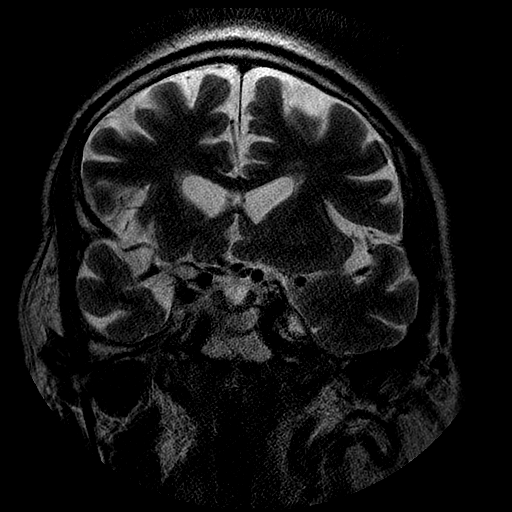
[im 25/25]
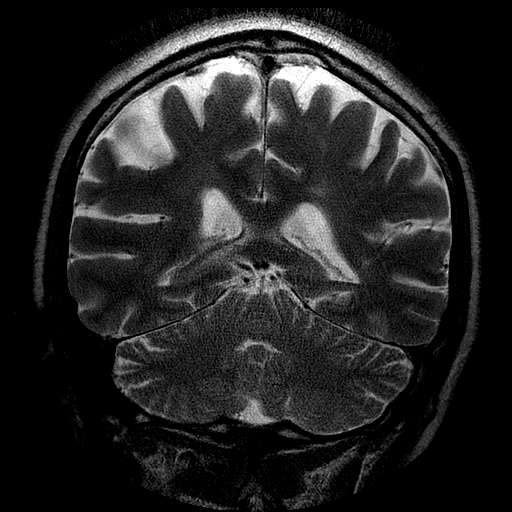

[Series 10: FLAIR · axial · 5.0mm · 0.43mm/px · z∈[-54,+85]mm · 3 of 21 slices shown]
[im 1/21]
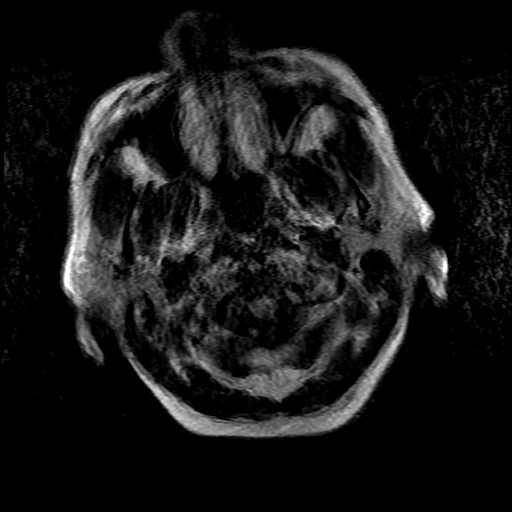
[im 11/21]
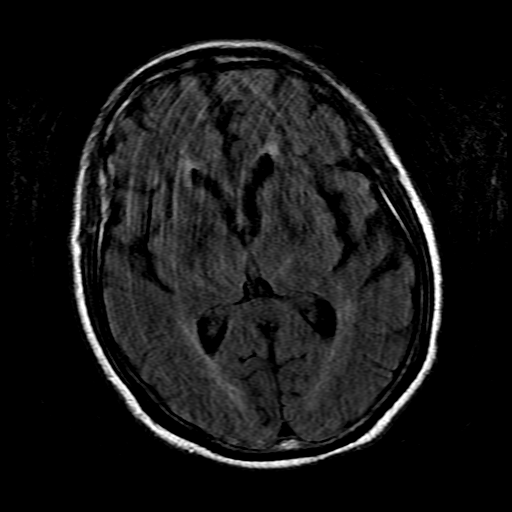
[im 21/21]
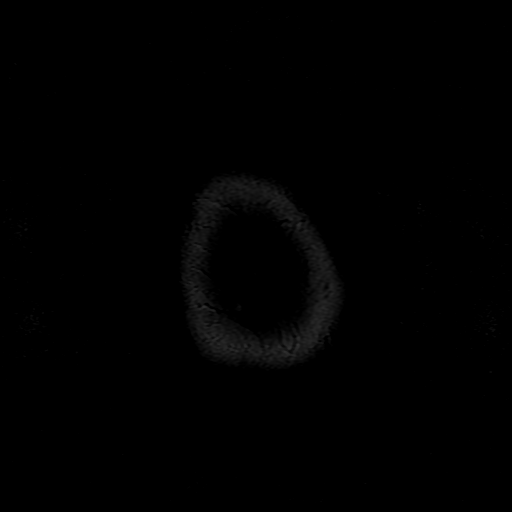

[Series 11: ax mpgr · axial · 5.0mm · 0.43mm/px · 1 of 22 slices shown]
[im 1/22]
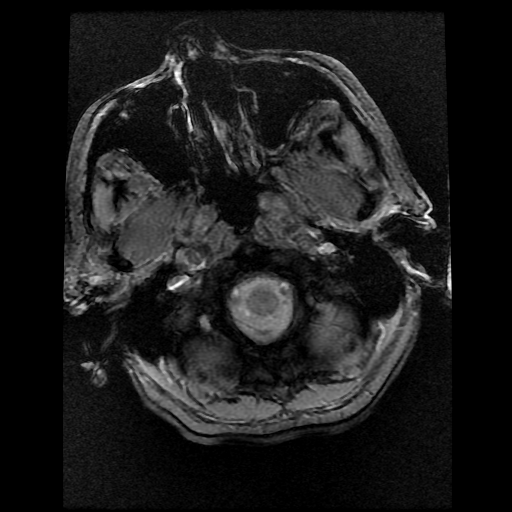

[Series 300: DWI · axial · 5.0mm · 1.09mm/px · z∈[-51,+92]mm · 4 of 30 slices shown (3 of 4)]
[im 1/30]
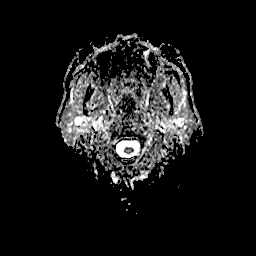
[im 10/30]
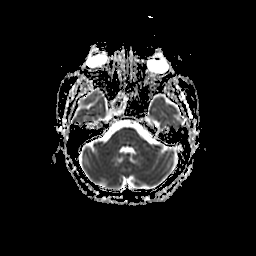
[im 20/30]
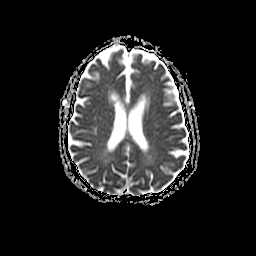
[im 30/30]
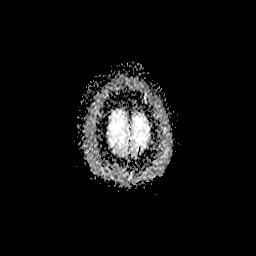

[Series 800: DWI · coronal · 5.0mm · 1.09mm/px · 4 of 32 slices shown (4 of 4)]
[im 1/32]
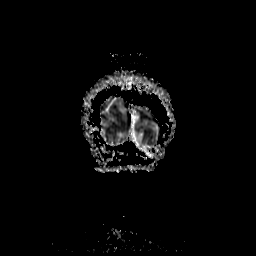
[im 11/32]
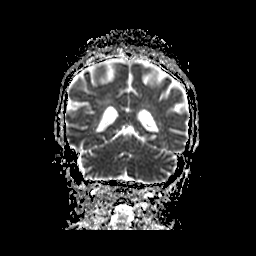
[im 21/32]
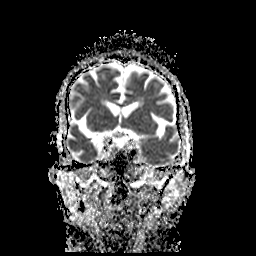
[im 32/32]
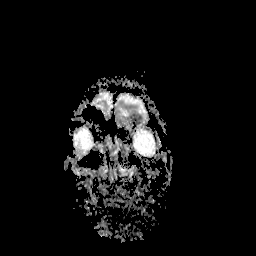

[34 of 48 positions shown; findings below may reference images not displayed]

FINDINGS: Patient without IV access and therefore performed without contrast.
Dr. Tiger notifyed.

Exam is motion degraded.

No acute infarct.

No evidence of mesial temporal sclerosis.

No intracranial hemorrhage.

Moderate small vessel disease type changes.

Global atrophy without hydrocephalus.

Major intracranial vascular structures are patent. Vertebral
arteries and basilar artery are ectatic.

Paranasal sinus mucosal thickening most notable right sphenoid
sinus.

Cervical spondylotic changes with spinal stenosis and mild cord
flattening C3-4 and less so C5-6.

Partially empty sella.

Orbital structures grossly within normal limits.
IMPRESSION: Patient without IV access and therefore performed without contrast.
Dr. Tiger notifyed.

Exam is motion degraded.

No acute infarct.

No evidence of mesial temporal sclerosis.

No intracranial hemorrhage.

Moderate small vessel disease type changes.

Global atrophy without hydrocephalus.

No intracranial mass lesion noted on this unenhanced motion degraded
exam.

Paranasal sinus mucosal thickening most notable right sphenoid
sinus.

Cervical spondylotic changes with spinal stenosis and mild cord
flattening C3-4 and less so C5-6.

Partially empty sella.

## 2015-03-30 IMAGING — CR DG CHEST 1V PORT
1 series · 1 of 1 positions shown · non-contrast
Comparison: 07/03/2013

CLINICAL DATA: Hypoglycemic seizure.

EXAM:
PORTABLE CHEST - 1 VIEW

[AP]
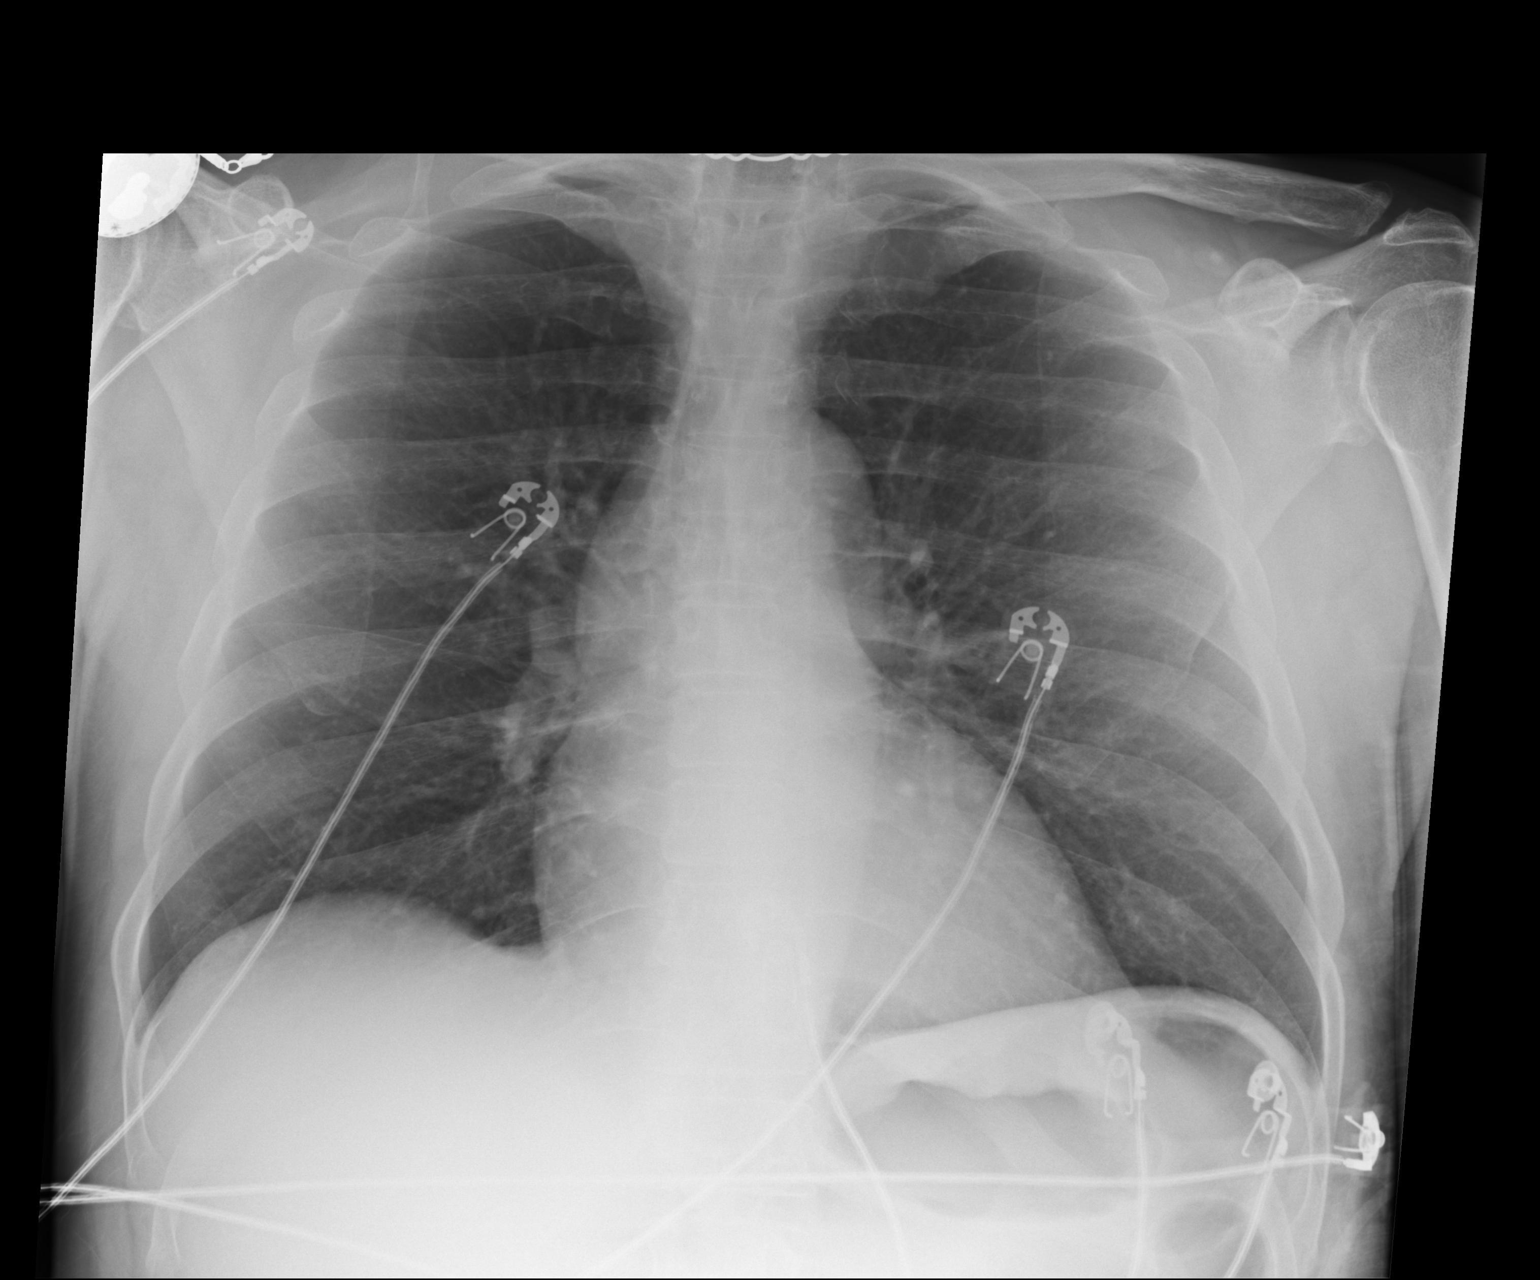

[1 of 1 positions shown; findings below may reference images not displayed]

FINDINGS: The heart size and mediastinal contours are within normal limits.
Both lungs are clear. The bony thorax is intact.
IMPRESSION: No active disease.

## 2015-03-30 IMAGING — CT CT HEAD W/O CM
1 series · 16 of 30 positions shown, 20 images · non-contrast
Comparison: CT HEAD W/O CM dated 07/03/2013; CT HEAD W/O CM dated
04/30/2013

CLINICAL DATA: Prolonged seizure

EXAM:
CT HEAD WITHOUT CONTRAST
TECHNIQUE: Contiguous axial images were obtained from the base of the skull
through the vertex without intravenous contrast.

[Series 2: head 5.0 h30s · axial · 0.44mm/px · z∈[-87,+48]mm · 16 of 31 slices shown, 20 images]
[im 2/31  brain]
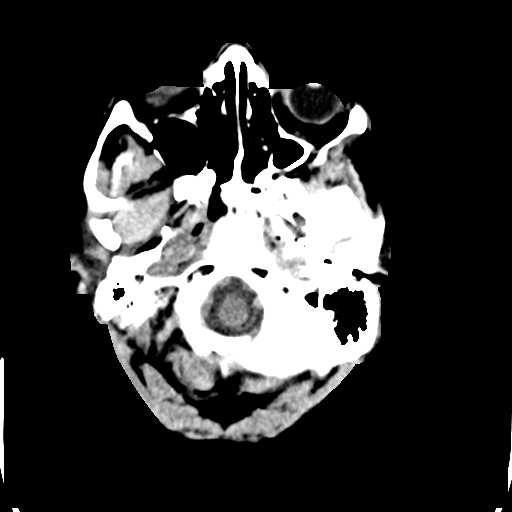
[im 2/31  bone]
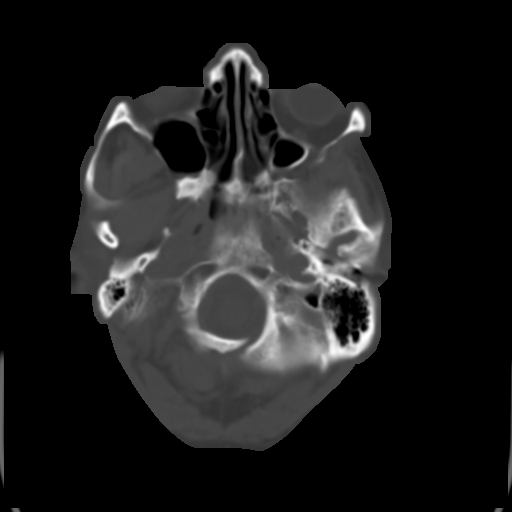
[im 4/31  brain]
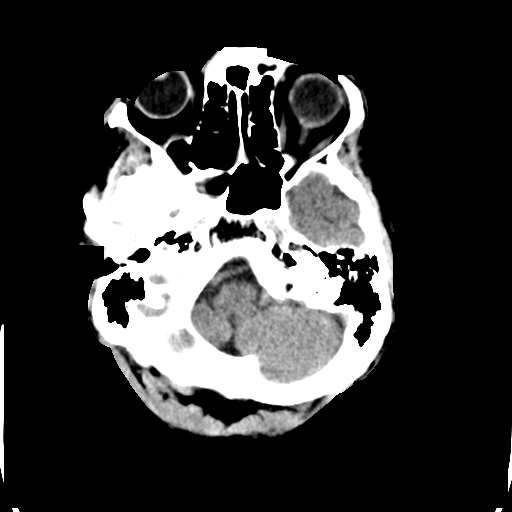
[im 6/31  brain]
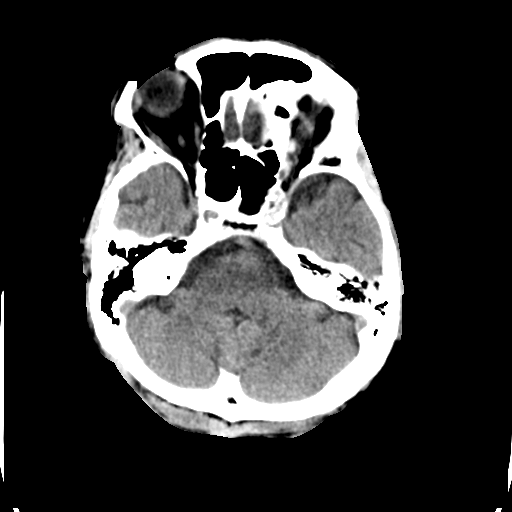
[im 8/31  brain]
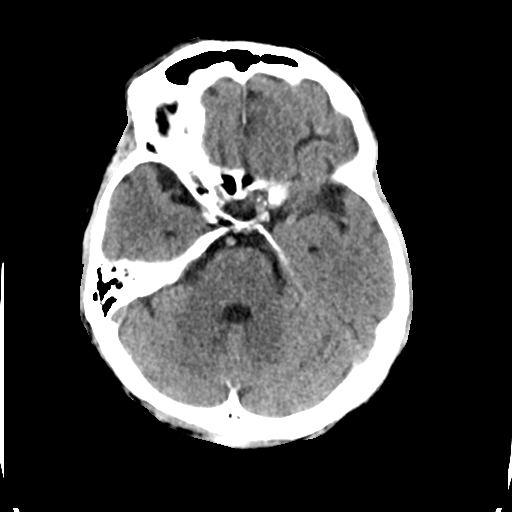
[im 9/31  brain]
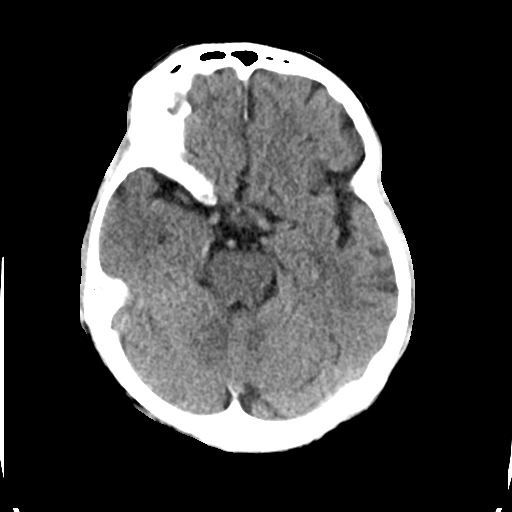
[im 9/31  bone]
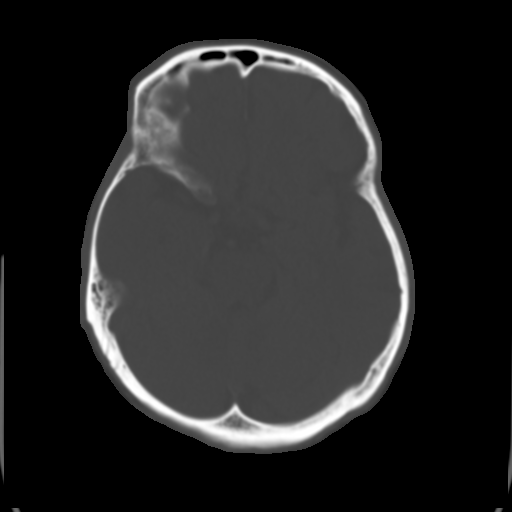
[im 11/31  brain]
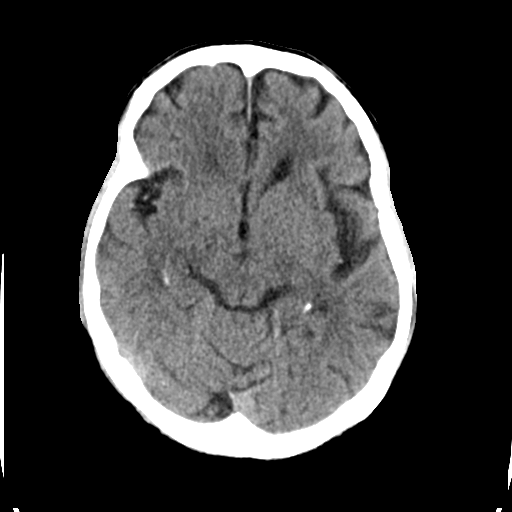
[im 13/31  brain]
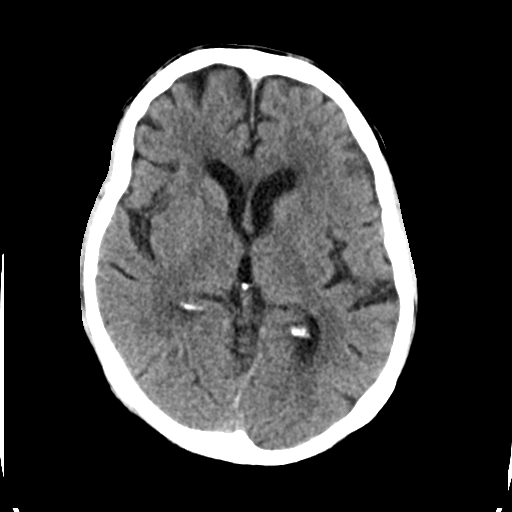
[im 15/31  brain]
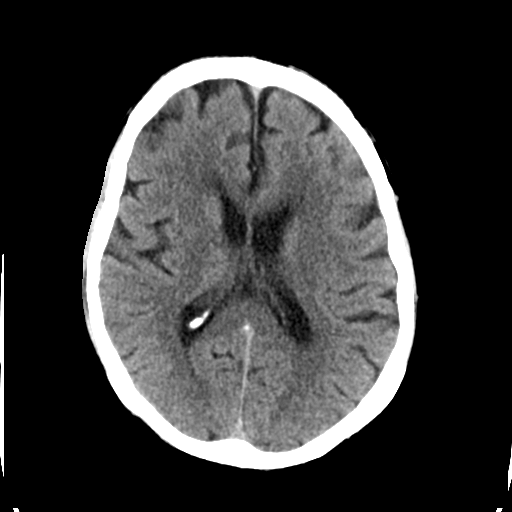
[im 16/31  brain]
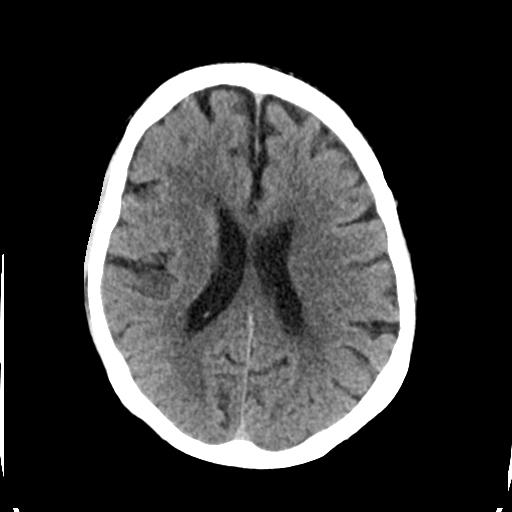
[im 16/31  bone]
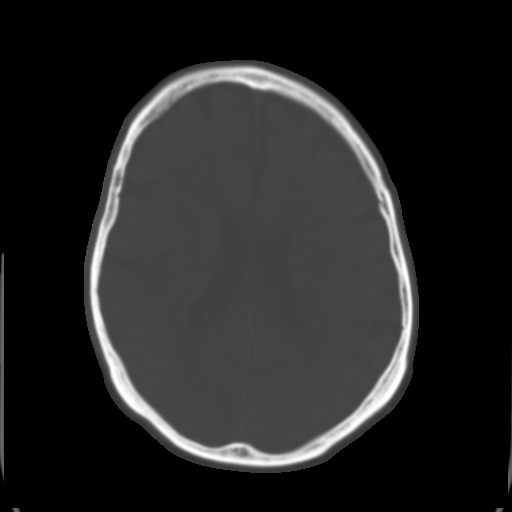
[im 18/31  brain]
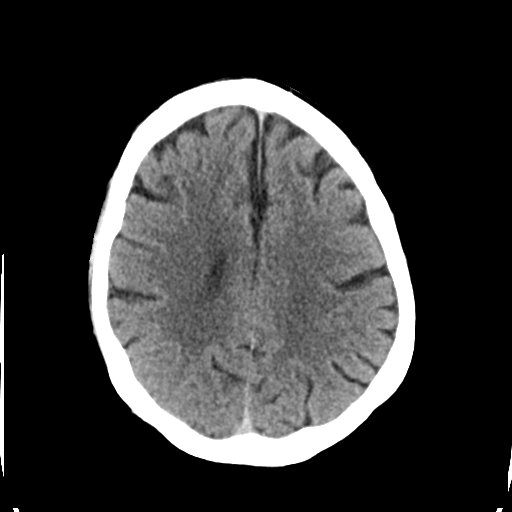
[im 20/31  brain]
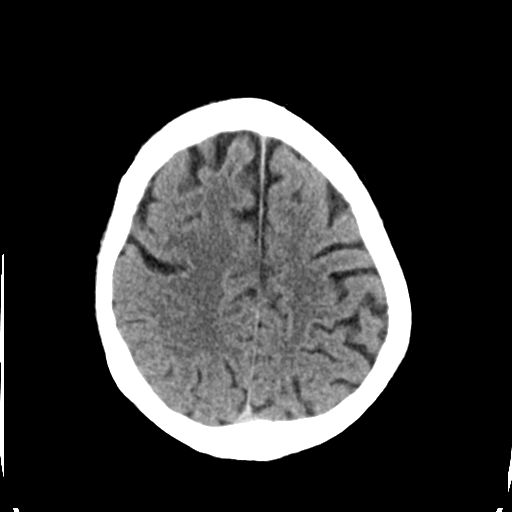
[im 22/31  brain]
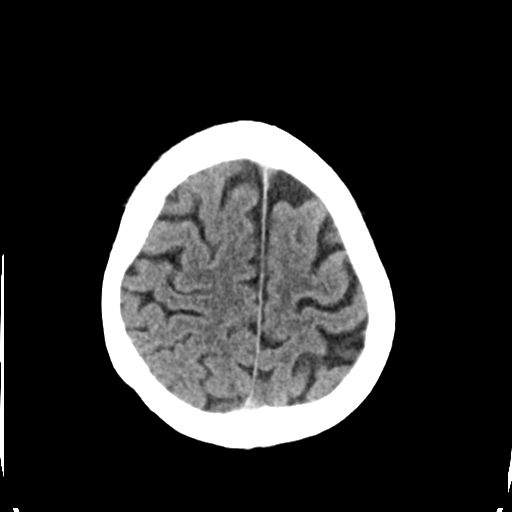
[im 23/31  brain]
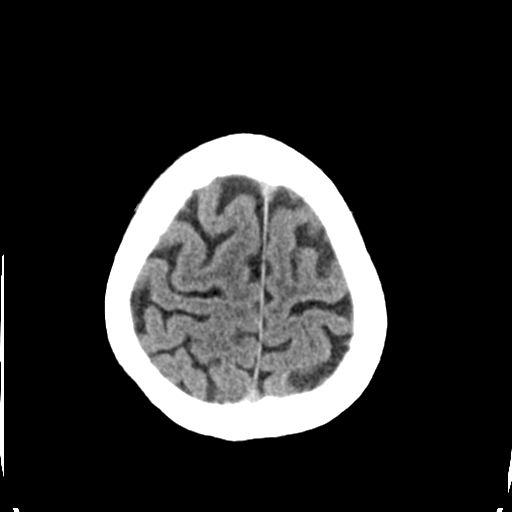
[im 23/31  bone]
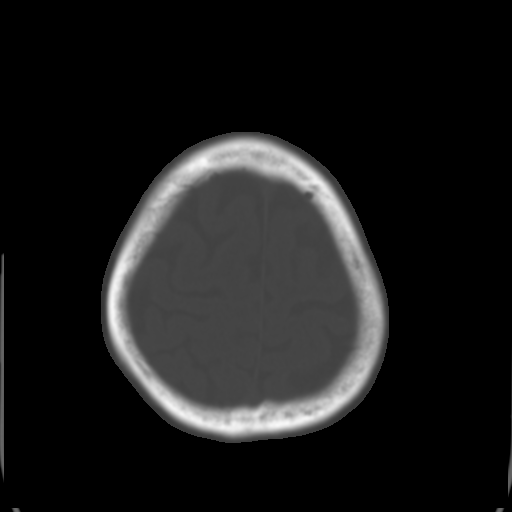
[im 25/31  brain]
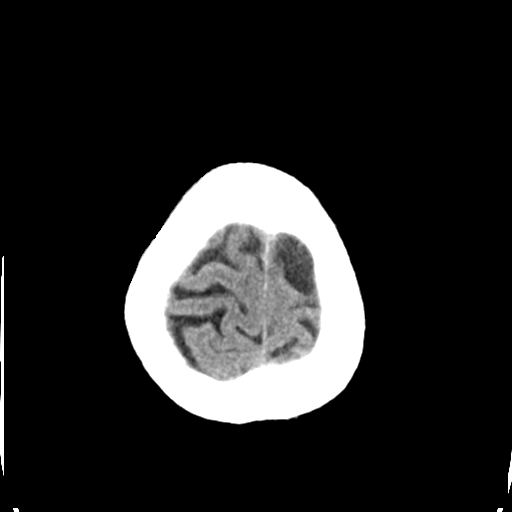
[im 27/31  brain]
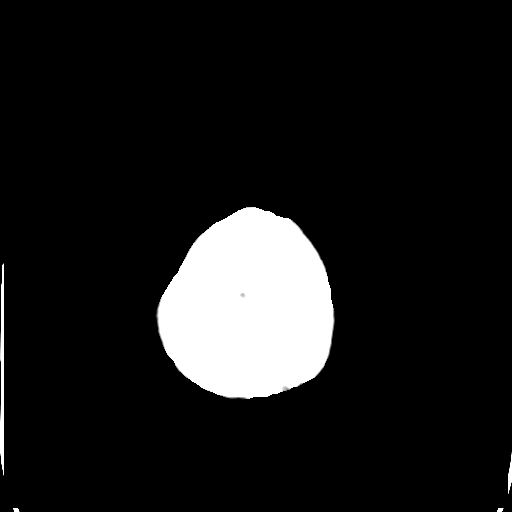
[im 29/31  brain]
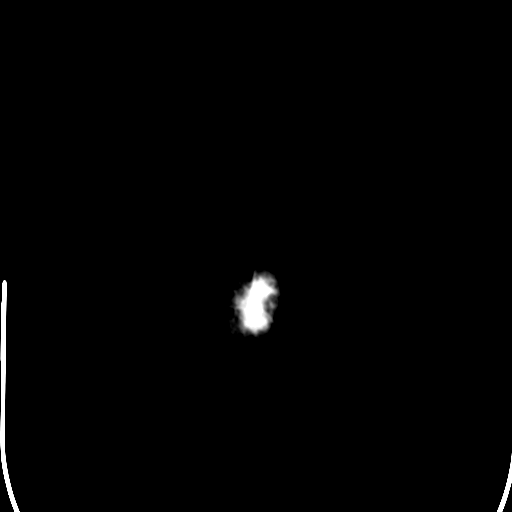

[16 of 30 positions shown; findings below may reference images not displayed]

FINDINGS: Mild atrophy with sulcal prominence. Scattered minimal
periventricular hypodensities compatible microvascular ischemic
disease. No CT evidence for acute large territory infarct. No
intraparenchymal extra-axial mass or hemorrhage. Unchanged size and
configuration of the ventricles and basilar cisterns. No midline
shift. Intracranial atherosclerosis. There is minimal polypoid
mucosal thickening within the bilateral sphenoid sinuses. Remaining
paranasal sinuses and mastoid air cells are normally aerated. No
air-fluid levels. Regional soft tissues appear normal. No displaced
calvarial fracture.
IMPRESSION: Similar findings of mild atrophy and microvascular ischemic disease
without acute intracranial process.

## 2015-04-26 ENCOUNTER — Ambulatory Visit: Payer: Medicare HMO | Admitting: Family

## 2015-05-11 ENCOUNTER — Other Ambulatory Visit (INDEPENDENT_AMBULATORY_CARE_PROVIDER_SITE_OTHER): Payer: Medicare HMO

## 2015-05-11 ENCOUNTER — Ambulatory Visit (INDEPENDENT_AMBULATORY_CARE_PROVIDER_SITE_OTHER): Payer: Medicare HMO | Admitting: Family

## 2015-05-11 ENCOUNTER — Encounter: Payer: Self-pay | Admitting: Family

## 2015-05-11 VITALS — BP 140/84 | HR 68 | Temp 98.1°F | Resp 18 | Ht 69.0 in | Wt 146.0 lb

## 2015-05-11 DIAGNOSIS — I10 Essential (primary) hypertension: Secondary | ICD-10-CM

## 2015-05-11 DIAGNOSIS — E1165 Type 2 diabetes mellitus with hyperglycemia: Secondary | ICD-10-CM

## 2015-05-11 DIAGNOSIS — I639 Cerebral infarction, unspecified: Secondary | ICD-10-CM

## 2015-05-11 DIAGNOSIS — IMO0002 Reserved for concepts with insufficient information to code with codable children: Secondary | ICD-10-CM

## 2015-05-11 DIAGNOSIS — F411 Generalized anxiety disorder: Secondary | ICD-10-CM

## 2015-05-11 LAB — COMPREHENSIVE METABOLIC PANEL
ALT: 23 U/L (ref 0–53)
AST: 31 U/L (ref 0–37)
Albumin: 4.2 g/dL (ref 3.5–5.2)
Alkaline Phosphatase: 101 U/L (ref 39–117)
BUN: 16 mg/dL (ref 6–23)
CO2: 24 mEq/L (ref 19–32)
Calcium: 9.3 mg/dL (ref 8.4–10.5)
Chloride: 102 mEq/L (ref 96–112)
Creatinine, Ser: 1.13 mg/dL (ref 0.40–1.50)
GFR: 82.45 mL/min (ref >60.00)
Glucose, Bld: 253 mg/dL — ABNORMAL HIGH (ref 70–99)
Potassium: 4.2 mEq/L (ref 3.5–5.1)
Sodium: 137 mEq/L (ref 135–145)
Total Bilirubin: 0.5 mg/dL (ref 0.2–1.2)
Total Protein: 7.7 g/dL (ref 6.0–8.3)

## 2015-05-11 MED ORDER — ALPRAZOLAM 1 MG PO TABS: 1.0000 mg | ORAL_TABLET | Freq: Two times a day (BID) | ORAL | Status: DC | PRN

## 2015-05-11 NOTE — Assessment & Plan Note (Signed)
Type 2 diabetes remains uncontrolled with A1c of 10.6. Blood sugars at home indicates adequate control on the morning and decreased control throughout the day. Increase NovoLog 70/30-20 units in the morning and 15 units in the evening. Diabetic eye exam completed. Diabetic foot exam completed with questionable outcome as patient responded yes to almost all touches with monofilament despite not being touched. Continue to monitor blood sugar at home. Follow-up in one month.

## 2015-05-11 NOTE — Progress Notes (Signed)
Pre visit review using our clinic review tool, if applicable. No additional management support is needed unless otherwise documented below in the visit note. 

## 2015-05-11 NOTE — Assessment & Plan Note (Signed)
No significant residual effects from incidentally found stroke. Continue managing risk factors to decrease risk for stroke. Follow-up with neurology as scheduled.

## 2015-05-11 NOTE — Assessment & Plan Note (Signed)
Blood pressure appears well controlled with reading at 140/84 given recent stroke. Continue current dosage of ramipril. Obtain complete metabolic panel to recheck kidney function and electrolytes. Continue to monitor blood pressures at home as able. Follow up in 1 month.

## 2015-05-11 NOTE — Assessment & Plan Note (Signed)
Stable with current dose of Xanax. Continue current dosage of Xanax.

## 2015-05-11 NOTE — Progress Notes (Signed)
Subjective:    Patient ID: [name] [name], male    DOB: June 09, 1946, 69 y.o.   MRN: [id number]  Chief Complaint  Patient presents with  . Establish Care    HPI:  [name] [name] is a 69 y.o. male with a PMH of type 2 diabetes, stroke, seizure, retention, chronic kidney disease, anxiety, and chronic back pain who presents today for an office visit to establish care.   1.) Type 2 diabetes - Currently maintained on 15 units of Novolog 70/30 twice daily. Reports that he takes the medications as prescribed and denies adverse side effects. At home blood sugars in the morning are are 80-90 and the afternoon 200-300. Reports that he is cutting down on his sugar intake.   Lab Results  Component Value Date   HGBA1C 10.6* 03/21/2015    2.) Hypertension - Currently maintained on ramipril. Takes the medication as prescribed and denies cough. Does not have a blood pressure machine, so he does not currently monitor his blood pressure at home.  BP Readings from Last 3 Encounters:  05/11/15 140/84  03/23/15 186/80  06/28/14 186/80   3.) Stroke - Previously admitted to the hospital for hypotension and found to have a 1 cm acute ischemic infarct within the right thalmaus and no associated hemorrhage. Hospital records were reviewed in detail. Notes that he has been stable with no residual effects. He has a follow up with neurology in about 1 month.   No Known Allergies   Outpatient Prescriptions Prior to Visit  Medication Sig Dispense Refill  . aspirin EC 81 MG EC tablet Take 1 tablet (81 mg total) by mouth daily. 30 tablet 0  . atorvastatin (LIPITOR) 10 MG tablet Take 1 tablet (10 mg total) by mouth daily at 6 PM. 30 tablet 0  . FLUoxetine (PROZAC) 20 MG capsule Take 20 mg by mouth daily.    . insulin aspart protamine- aspart (NOVOLOG MIX 70/30) (70-30) 100 UNIT/ML injection Inject 0.15 mLs (15 Units total) into the skin 2 (two) times daily. 10 mL 11  . oxycodone (ROXICODONE) 30 MG immediate release  tablet Take 30 mg by mouth every 4 (four) hours as needed for pain.     . ramipril (ALTACE) 10 MG capsule Take 10 mg by mouth every morning.    Marland Kitchen ALPRAZolam (XANAX) 1 MG tablet Take 1 tablet (1 mg total) by mouth 2 (two) times daily as needed for anxiety.  0   No facility-administered medications prior to visit.     Past Medical History  Diagnosis Date  . Diabetes mellitus without complication   . Hypertension   . Hernia   . Back pain   . Seizures   . Diabetic neuropathy   . Stroke      Past Surgical History  Procedure Laterality Date  . Hip arthroplasty Right 02/05/2013    Procedure: ARTHROPLASTY BIPOLAR HIP;  Surgeon: [name], MD;  Location: [facility];  Service: Orthopedics;  Laterality: Right;     Family History  Problem Relation Age of Onset  . Diabetes Mother   . Alzheimer's disease Mother   . Hypertension Mother   . Hyperlipidemia Father   . Hypertension Father   . Healthy Maternal Grandmother   . Pneumonia Maternal Grandfather      History   Social History  . Marital Status: Single    Spouse Name: N/A  . Number of Children: 2  . Years of Education: 13   Occupational History  . Retired  Social History Main Topics  . Smoking status: Former Smoker -- 0.25 packs/day for 30 years    Types: Cigarettes    Quit date: 01/31/2013  . Smokeless tobacco: Never Used  . Alcohol Use: Yes     Comment: rarely  . Drug Use: No  . Sexual Activity: Not on file   Other Topics Concern  . Not on file   Social History Narrative   Fun: Likes to do handyman related projects, photography.    Denies religious beliefs effecting health care.     Review of Systems  Respiratory: Negative for chest tightness and shortness of breath.   Cardiovascular: Negative for chest pain, palpitations and leg swelling.  Endocrine: Negative for polydipsia, polyphagia and polyuria.  Neurological: Positive for numbness. Negative for weakness and headaches.      Objective:    BP  140/84 mmHg  Pulse 68  Temp(Src) 98.1 F (36.7 C) (Oral)  Resp 18  Ht _0  (1.753 m)  Wt 146 lb (66.225 kg)  BMI 21.55 kg/m2  SpO2 98% Nursing note and vital signs reviewed.  Physical Exam  Constitutional: He is oriented to person, place, and time. He appears well-developed and well-nourished. No distress.  Cardiovascular: Normal rate, regular rhythm, normal heart sounds and intact distal pulses.   Pulmonary/Chest: Effort normal and breath sounds normal.  Neurological: He is alert and oriented to person, place, and time.  Skin: Skin is warm and dry.  Psychiatric: He has a normal mood and affect. His behavior is normal. Judgment and thought content normal.       Assessment & Plan:   Problem List Items Addressed This Visit      Cardiovascular and Mediastinum   HTN (hypertension) - Primary    Blood pressure appears well controlled with reading at 140/84 given recent stroke. Continue current dosage of ramipril. Obtain complete metabolic panel to recheck kidney function and electrolytes. Continue to monitor blood pressures at home as able. Follow up in 1 month.       Relevant Orders   Comp Met (CMET) (Completed)   Stroke    No significant residual effects from incidentally found stroke. Continue managing risk factors to decrease risk for stroke. Follow-up with neurology as scheduled.        Other   Type II diabetes mellitus, uncontrolled    Type 2 diabetes remains uncontrolled with A1c of 10.6. Blood sugars at home indicates adequate control on the morning and decreased control throughout the day. Increase NovoLog 70/30-20 units in the morning and 15 units in the evening. Diabetic eye exam completed. Diabetic foot exam completed with questionable outcome as patient responded yes to almost all touches with monofilament despite not being touched. Continue to monitor blood sugar at home. Follow-up in one month.      Relevant Orders   Comp Met (CMET) (Completed)   Generalized  anxiety disorder    Stable with current dose of Xanax. Continue current dosage of Xanax.      Relevant Medications   ALPRAZolam (XANAX) 1 MG tablet

## 2015-05-11 NOTE — Patient Instructions (Signed)
Thank you for choosing [facility].  Summary/Instructions:  Increase your morning dose of insulin to 20 units and continue to take your 15 units at night.   Monitor your blood sugars and blood pressure at home and please record them to bring to your appointment.   Your prescription(s) have been submitted to your pharmacy or been printed and provided for you. Please take as directed and contact our office if you believe you are having problem(s) with the medication(s) or have any questions.  Please stop by the lab on the basement level of the building for your blood work. Your results will be released to [place] (or called to you) after review, usually within 72 hours after test completion. If any changes need to be made, you will be notified at that same time.  If your symptoms worsen or fail to improve, please contact our office for further instruction, or in case of emergency go directly to the emergency room at the closest medical facility.

## 2015-05-12 ENCOUNTER — Telehealth: Payer: Self-pay | Admitting: Family

## 2015-05-12 NOTE — Telephone Encounter (Signed)
Please inform patient that his blood work shows that his kidney function, electrolytes and liver function are normal. His blood sugar was slightly elevated, however this was addressed during the office visit.

## 2015-05-16 NOTE — Telephone Encounter (Signed)
Number was not in service. Letter sent.

## 2015-05-26 IMAGING — CR DG CHEST 1V PORT
1 series · 1 of 1 positions shown · non-contrast
Comparison: January 21, 2014

CLINICAL DATA: Shortness of Breath

EXAM:
PORTABLE CHEST - 1 VIEW

[AP]
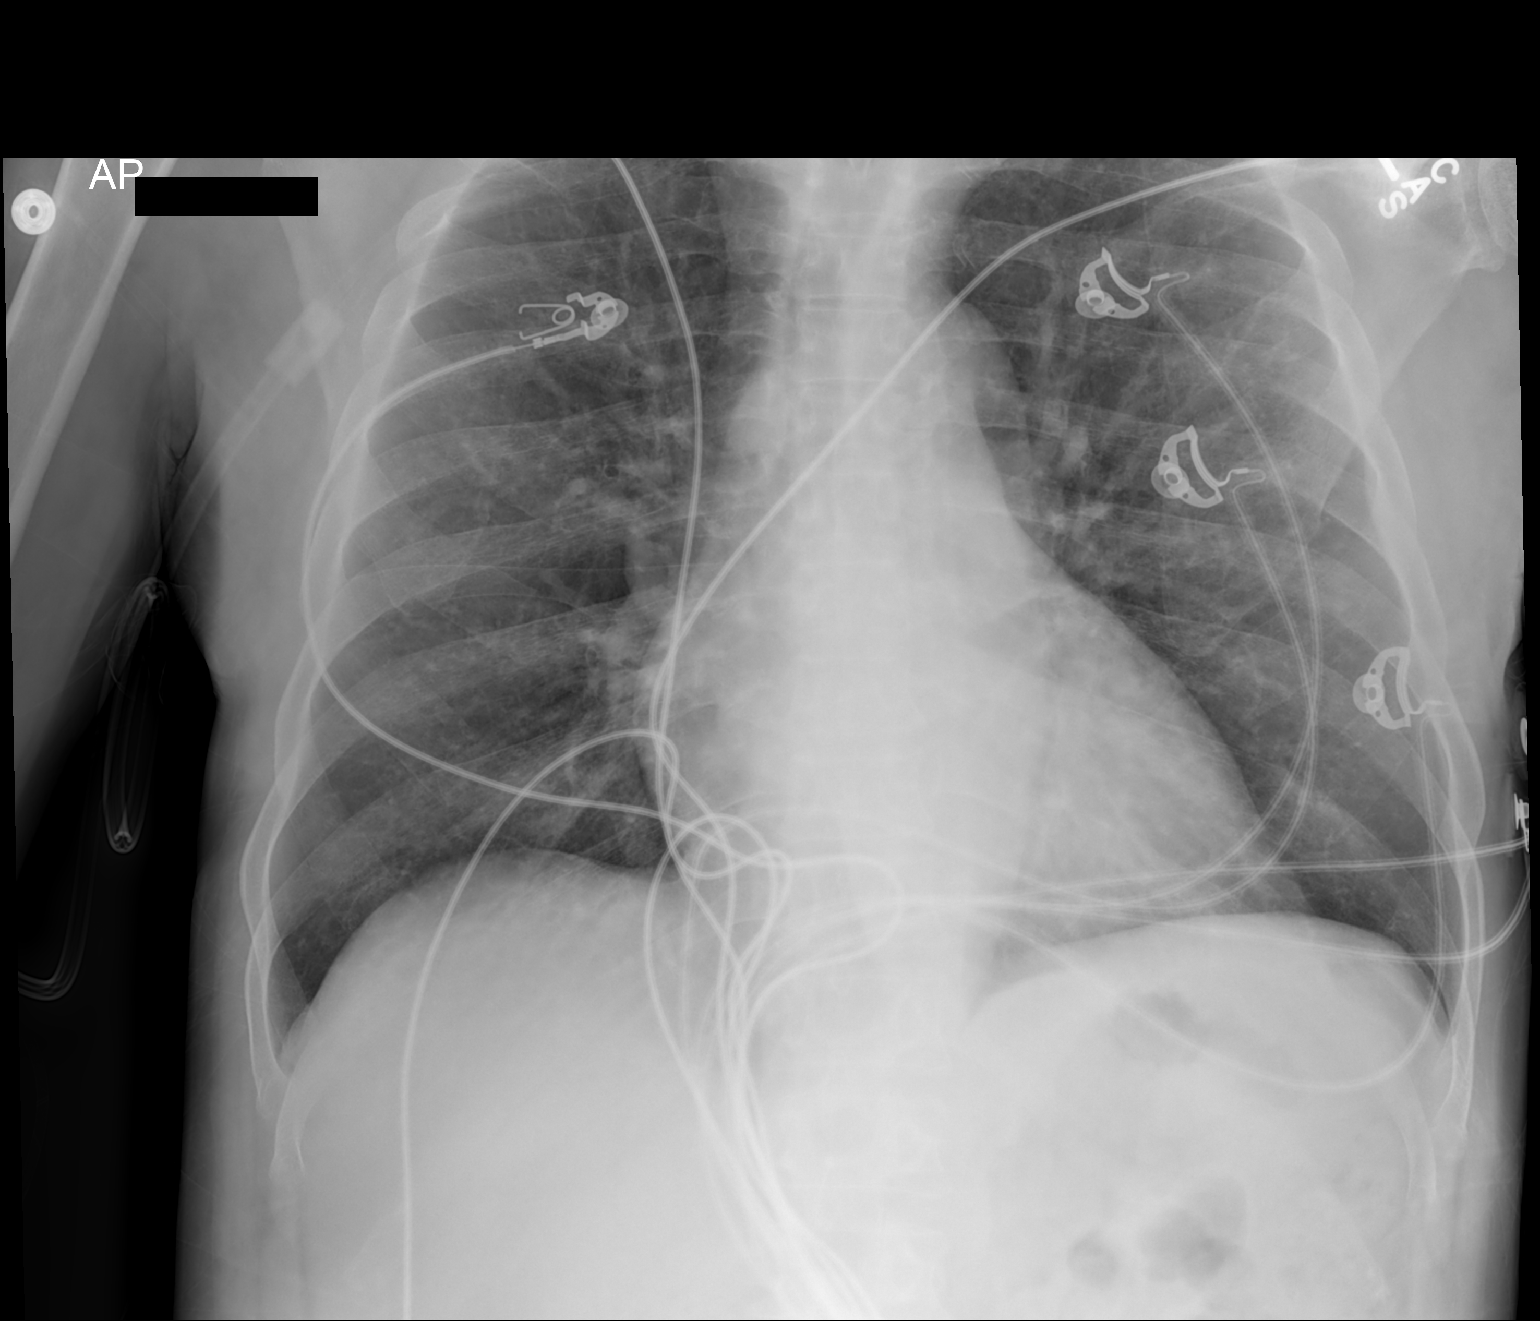

[1 of 1 positions shown; findings below may reference images not displayed]

FINDINGS: The lungs are clear. Heart size and pulmonary vascularity are
normal. No adenopathy. No bone lesions.
IMPRESSION: No edema or consolidation.

## 2015-05-26 IMAGING — CT CT HEAD W/O CM
2 series · 16 of 30 positions shown, 20 images · non-contrast
Comparison: 01/21/2014

CLINICAL DATA: Altered mental status.  Lethargy.

EXAM:
CT HEAD WITHOUT CONTRAST
TECHNIQUE: Contiguous axial images were obtained from the base of the skull
through the vertex without intravenous contrast.

[Series 2: head w/o · axial · non-contrast · 0.43mm/px · z∈[-61,+59]mm · 13 of 29 slices shown, 17 images]
[im 3/29  brain]
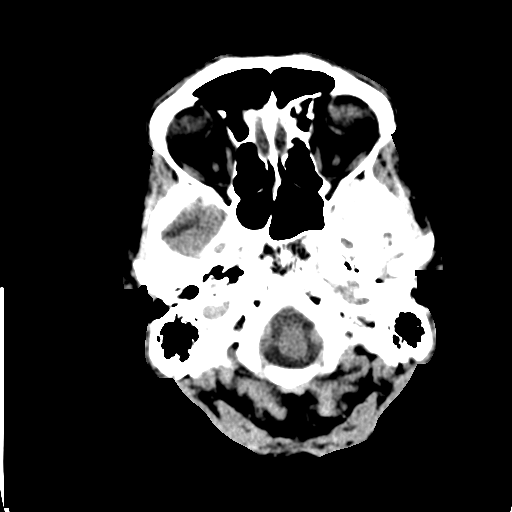
[im 3/29  bone]
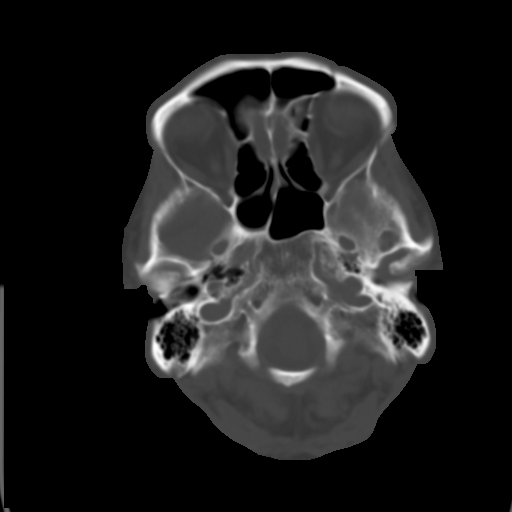
[im 5/29  brain]
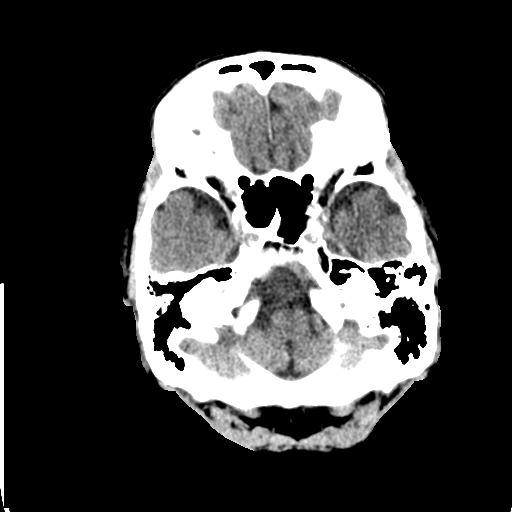
[im 7/29  brain]
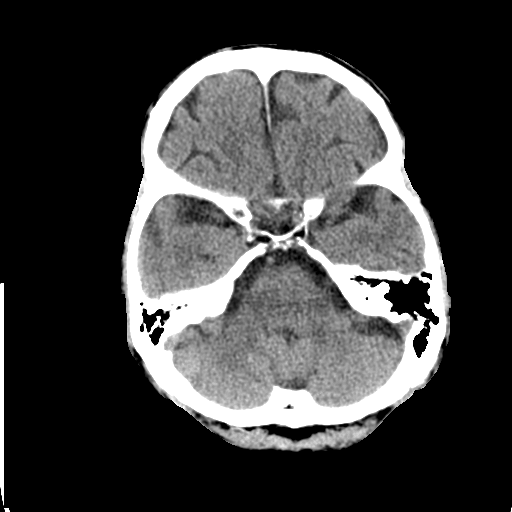
[im 9/29  brain]
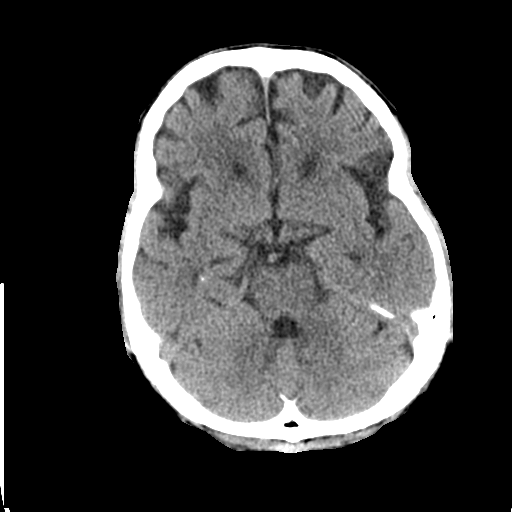
[im 11/29  brain]
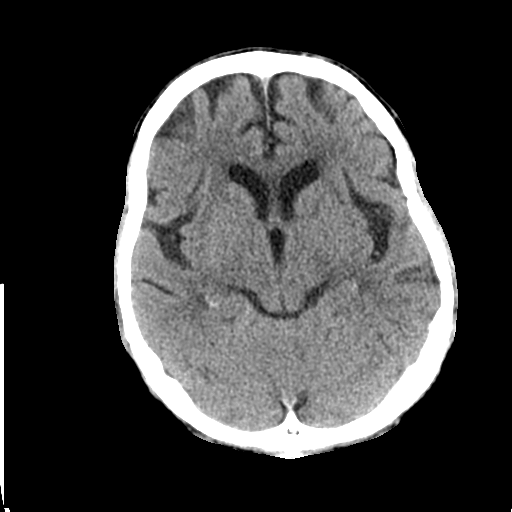
[im 11/29  bone]
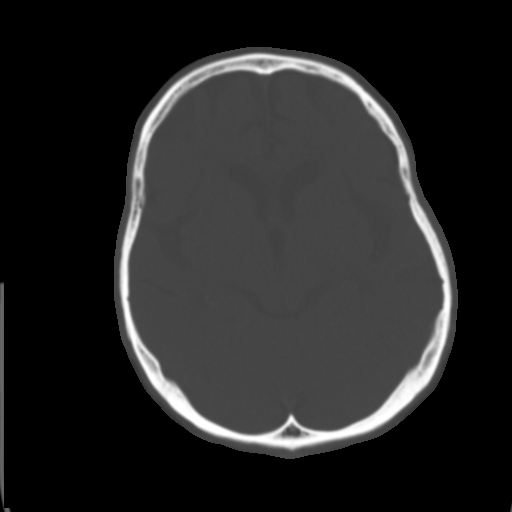
[im 13/29  brain]
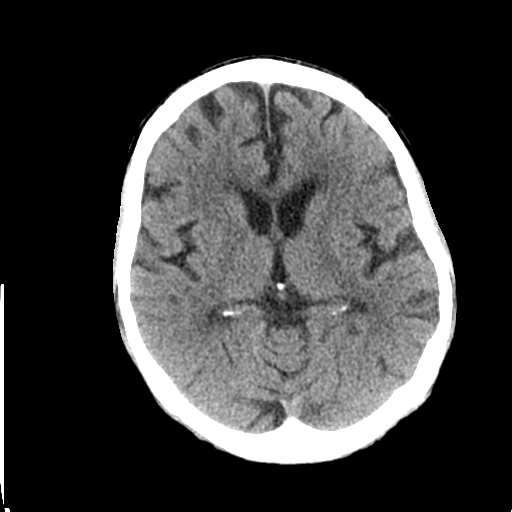
[im 15/29  brain]
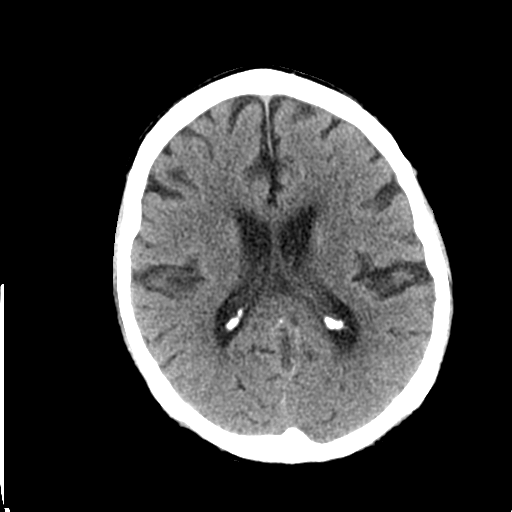
[im 17/29  brain]
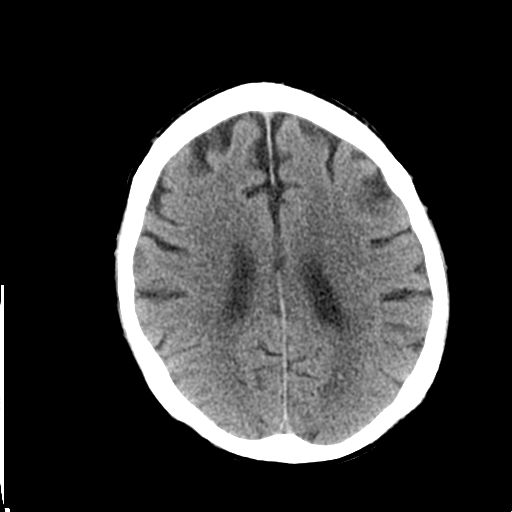
[im 19/29  brain]
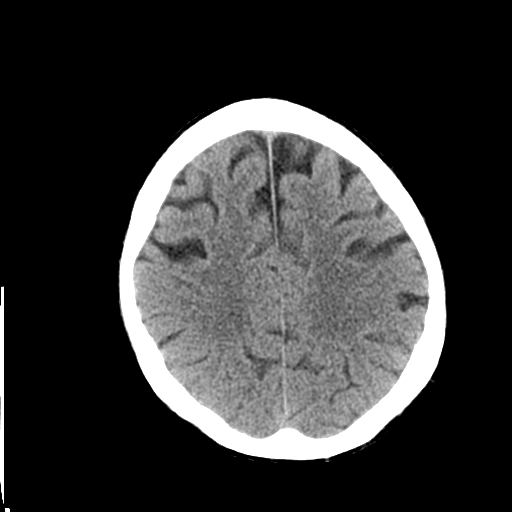
[im 19/29  bone]
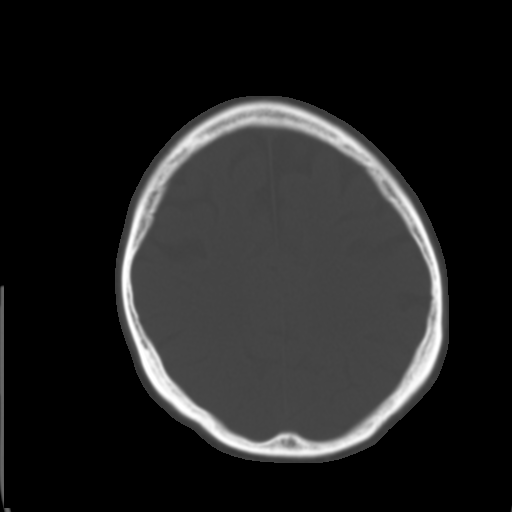
[im 21/29  brain]
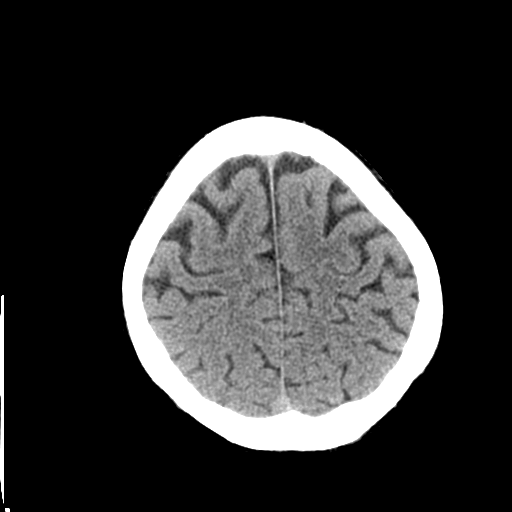
[im 23/29  brain]
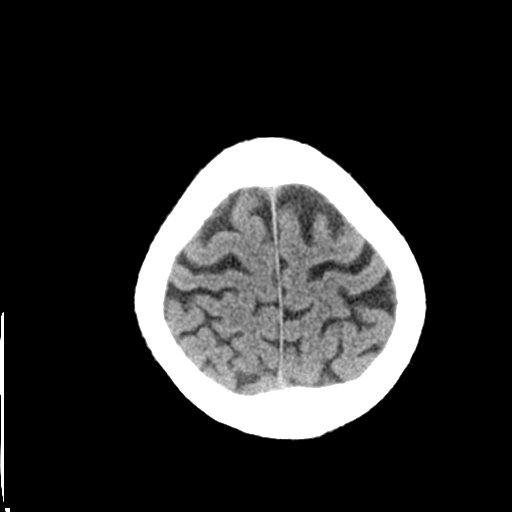
[im 25/29  brain]
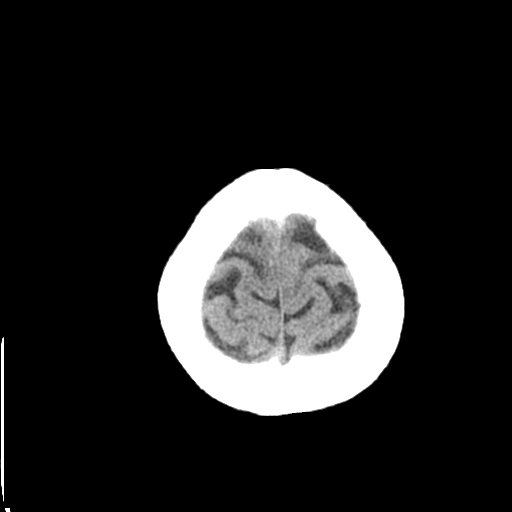
[im 27/29  brain]
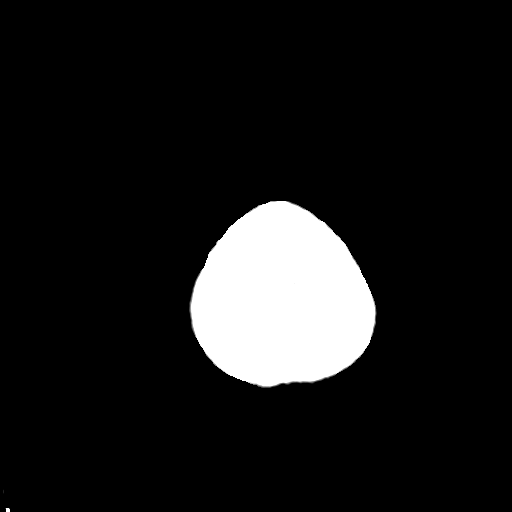
[im 27/29  bone]
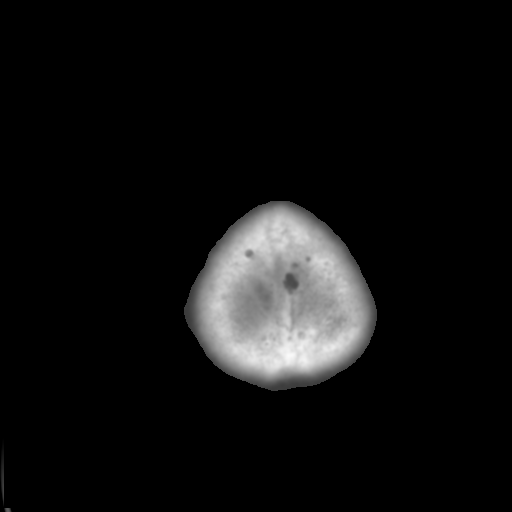

[Series 3: bone windows · axial · 0.43mm/px · z∈[-61,-21]mm · 3 of 29 slices shown]
[im 3/29  bone]
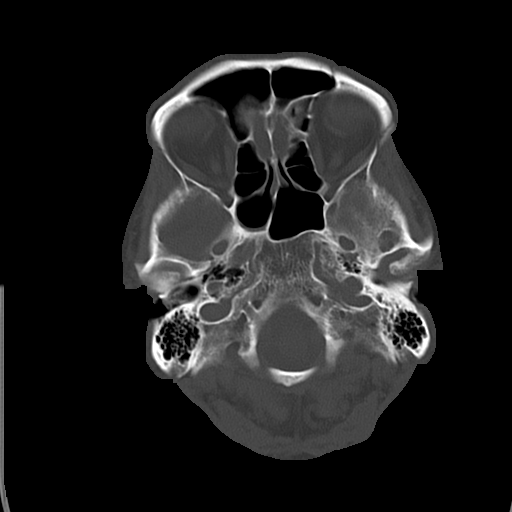
[im 7/29  bone]
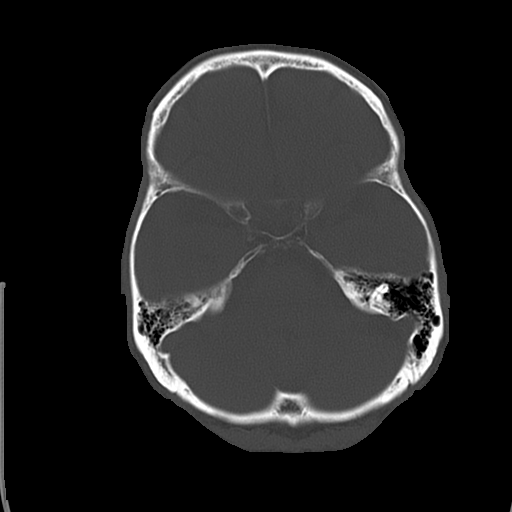
[im 11/29  bone]
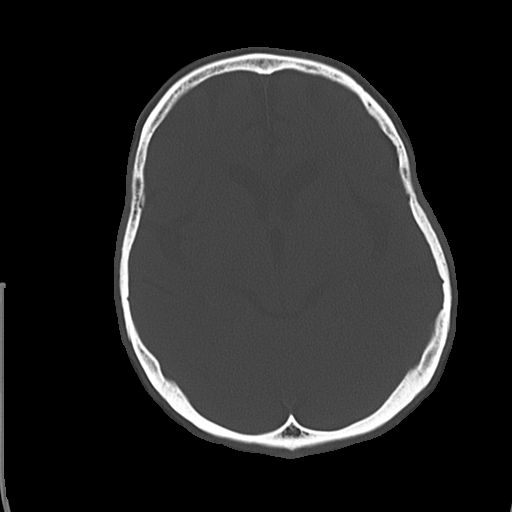

[16 of 30 positions shown; findings below may reference images not displayed]

FINDINGS: There is no evidence of intracranial hemorrhage, brain edema, or
other signs of acute infarction. There is no evidence of
intracranial mass lesion or mass effect. No abnormal extraaxial
fluid collections are identified.

Mild diffuse cerebral atrophy and chronic small vessel disease
remains stable. Ventricles are stable in size. No skull abnormality
identified.
IMPRESSION: No acute intracranial abnormality.

Stable mild cerebral atrophy and chronic small vessel disease.

## 2015-06-08 ENCOUNTER — Ambulatory Visit (INDEPENDENT_AMBULATORY_CARE_PROVIDER_SITE_OTHER): Payer: Medicare HMO | Admitting: Neurology

## 2015-06-08 ENCOUNTER — Encounter: Payer: Self-pay | Admitting: Neurology

## 2015-06-08 VITALS — BP 146/76 | HR 68 | Ht 69.0 in | Wt 152.8 lb

## 2015-06-08 DIAGNOSIS — E785 Hyperlipidemia, unspecified: Secondary | ICD-10-CM

## 2015-06-08 DIAGNOSIS — E1159 Type 2 diabetes mellitus with other circulatory complications: Secondary | ICD-10-CM

## 2015-06-08 DIAGNOSIS — I639 Cerebral infarction, unspecified: Secondary | ICD-10-CM

## 2015-06-08 DIAGNOSIS — I63331 Cerebral infarction due to thrombosis of right posterior cerebral artery: Secondary | ICD-10-CM | POA: Diagnosis not present

## 2015-06-08 DIAGNOSIS — I10 Essential (primary) hypertension: Secondary | ICD-10-CM | POA: Diagnosis not present

## 2015-06-08 HISTORY — DX: Cerebral infarction due to thrombosis of right posterior cerebral artery: I63.331

## 2015-06-08 MED ORDER — ASPIRIN EC 325 MG PO TBEC: 325.0000 mg | DELAYED_RELEASE_TABLET | Freq: Every day | ORAL | Status: DC

## 2015-06-08 NOTE — Progress Notes (Signed)
STROKE NEUROLOGY FOLLOW UP NOTE  NAME: Jaethan Duel DOB: 05-14-1946  REASON FOR VISIT: stroke follow up HISTORY FROM: chart and pt  Today we had the pleasure of seeing Remigio Karaman in follow-up at our Neurology Clinic. Pt was accompanied by no one.   History Summary Mr. Eldean Gean is a 69 y.o. male with history of DM, CKD, HTN and chronic back pain was admitted on 03/19/15 for generalized weakness and lethargy. He did not receive IV t-PA due to stroke not suspected. However, MRI showed right thalamic infarct, likely due to small vessel disease. Stroke workup including MRA, carotid Doppler and 2-D echo were all unremarkable. LDL 77, however A1c 10.6, indicating his diabetes not in good control. He was started on aspirin 81 and put on low-dose Lipitor 10. Symptoms resolved and he was discharged in good condition  Interval History During the interval time, the patient has been doing well. No recurrent stroke like symptoms. He follows up with his PCP, increased insulin dose for activities control, and continued ramipril for blood pressure control. His blood pressure today 146/76. He stated that he does not have blood pressure device at home, and he is going to get one from his pharmacy. He also recently moved to live with his mom, so his glucometer was not with him, however he checked several times with his sister's glucometer and was still high around 200-250.     REVIEW OF SYSTEMS: Full 14 system review of systems performed and notable only for those listed below and in HPI above, all others are negative:  Constitutional:  Fatigue Cardiovascular:  Ear/Nose/Throat:   Skin:  Eyes:  Blurry vision Respiratory:   Gastroitestinal:   Genitourinary:  Hematology/Lymphatic:  Easy bruising Endocrine:  Musculoskeletal:  Aching muscles Allergy/Immunology:   Neurological:  Numbness Psychiatric:  Sleep: Restless leg  The following represents the patient's updated allergies and side effects list: No  Known Allergies  The neurologically relevant items on the patient's problem list were reviewed on today's visit.  Neurologic Examination  A problem focused neurological exam (12 or more points of the single system neurologic examination, vital signs counts as 1 point, cranial nerves count for 8 points) was performed.  Blood pressure 146/76, pulse 68, height 5\' 9"  (1.753 m), weight 152 lb 12.8 oz (69.31 kg).  General - Well nourished, well developed, in no apparent distress.  Ophthalmologic - Sharp disc margins OU.  Cardiovascular - Regular rate and rhythm with no murmur.  Mental Status -  Level of arousal and orientation to time, place, and person were intact. Language including expression, naming, repetition, comprehension was assessed and found intact. Fund of Knowledge was assessed and was intact.  Cranial Nerves II - XII - II - Visual field intact OU. III, IV, VI - Extraocular movements intact. V - Facial sensation intact bilaterally. VII - Facial movement intact bilaterally, mild R nasal labial flattening, which is his baseline . VIII - Hearing & vestibular intact bilaterally. X - Palate elevates symmetrically. XI - Chin turning & shoulder shrug intact bilaterally. XII - Tongue protrusion intact.  Motor Strength - The patient's strength was normal in all extremities and pronator drift was absent.  Bulk was normal and fasciculations were absent.   Motor Tone - Muscle tone was assessed at the neck and appendages and was normal.  Reflexes - The patient's reflexes were 1+ in all extremities and he had no pathological reflexes.  Sensory - Light touch, temperature/pinprick, vibration and proprioception, and Romberg testing were assessed  and were normal.    Coordination - The patient had normal movements in the hands and feet with no ataxia or dysmetria.  Tremor was absent.  Gait and Station - The patient's transfers, posture, gait, station, and turns were observed as  normal.  Data reviewed: I personally reviewed the images and agree with the radiology interpretations.  MRI brain 1. Subtle 1 cm acute ischemic infarct within the right thalamus. No associated hemorrhage or mass effect. 2. No other acute intracranial process. 3. Atrophy with mild chronic small vessel ischemic disease. 4. 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   [name] 03/20/2015 Motion degraded study. No significant intracranial stenosis or occlusion.   US Renal 03/20/2015 Left renal cysts. No acute abnormality noted.   2D Echocardiogram  - Left ventricle: The cavity size was normal. Wall thickness wasnormal. Systolic function was normal. The estimated ejectionfraction was in the range of 60% to 65%. Wall motion was normal;there were no regional wall motion abnormalities. Leftventricular diastolic function parameters were normal. - Right ventricle: The cavity size was mildly dilated. - Atrial septum: No defect or patent foramen ovale was identified. - Tricuspid valve: There was moderate regurgitation.  Carotid Doppler There is 1-39% bilateral ICA stenosis. Vertebral artery flow is antegrade.   Component     Latest Ref Rng 03/21/2015  Cholesterol     0 - 200 mg/dL 148  Triglycerides     <150 mg/dL 139  HDL Cholesterol     >40 mg/dL 43  Total CHOL/HDL Ratio      3.4  VLDL     0 - 40 mg/dL 28  LDL (calc)     0 - 99 mg/dL 77  Hemoglobin A1C     4.8 - 5.6 % 10.6 (H)  Mean Plasma Glucose      258    Assessment: As you may recall, he is a 69 y.o. African American male with PMH of HTN, DM, CKD, chronic lower back pain was admitted on 03/19/2015 for right thalamic infarct, likely due to small vessel disease. Stroke workup negative including MRA, carotid Doppler and 2-D echo, LDL 77, however, A1c 10.6 indicating diabetes not in good control. He was discharged on aspirin  and Lipitor. He continues follow-up with his PCP for diabetes and BP control. Will increase aspirin from 81 to 325.  Plan:  - change ASA 81mg  to ASA 325mg  for stroke prevention - continue lipitor for stroke prevention - check glucose and BP at home and record and bring over to PCP for medication adjustment if indicated - Follow up with your primary care physician for stroke risk factor modification. Recommend maintain blood pressure goal <130/80, diabetes with hemoglobin A1c goal below 6.5% and lipids with LDL cholesterol goal below 70 mg/dL.  - RTC in 3 months  I spent more than 25 minutes of face to face time with the patient. Greater than 50% of time was spent in counseling and coordination of care. We have discussed about stroke risk factor modification.  No orders of the defined types were placed in this encounter.    Meds ordered this encounter  Medications  . DISCONTD: alprazolam [name]) 2 MG tablet    Sig:   . Oxycodone HCl 20 MG TABS    Sig:   . trimethoprim-polymyxin b (POLYTRIM) ophthalmic solution    Sig:   . aspirin EC 325 MG tablet    Sig: Take 1 tablet (325 mg  total) by mouth daily.    Dispense:  90 tablet    Refill:  3    Patient Instructions  - change ASA 81mg  to ASA 325mg  for full protection - continue lipitor for stroke prevention - check glucose at home - check BP at home and record and bring over to PCP. Goal around 130/80. - Follow up with your primary care physician for stroke risk factor modification. Recommend maintain blood pressure goal <130/80, diabetes with hemoglobin A1c goal below 6.5% and lipids with LDL cholesterol goal below 70 mg/dL.  - follow up 3 months    Rosalin Hawking, MD PhD [facility] [address], [place], [place] [address] [phone]

## 2015-06-08 NOTE — Patient Instructions (Signed)
-   change ASA 81mg  to ASA 325mg  for full protection - continue lipitor for stroke prevention - check glucose at home - check BP at home and record and bring over to PCP. Goal around 130/80. - Follow up with your primary care physician for stroke risk factor modification. Recommend maintain blood pressure goal <130/80, diabetes with hemoglobin A1c goal below 6.5% and lipids with LDL cholesterol goal below 70 mg/dL.  - follow up 3 months

## 2015-07-02 ENCOUNTER — Inpatient Hospital Stay (HOSPITAL_COMMUNITY)
Admission: EM | Admit: 2015-07-02 | Discharge: 2015-07-04 | DRG: 637 | Disposition: A | Discharge status: Home Health | Payer: Medicare HMO | Attending: Pulmonary Disease | Admitting: Pulmonary Disease

## 2015-07-02 ENCOUNTER — Emergency Department (HOSPITAL_COMMUNITY): Payer: Medicare HMO

## 2015-07-02 ENCOUNTER — Inpatient Hospital Stay (HOSPITAL_COMMUNITY): Payer: Medicare HMO

## 2015-07-02 ENCOUNTER — Encounter (HOSPITAL_COMMUNITY): Payer: Self-pay

## 2015-07-02 DIAGNOSIS — Z96641 Presence of right artificial hip joint: Secondary | ICD-10-CM | POA: Diagnosis present

## 2015-07-02 DIAGNOSIS — E1310 Other specified diabetes mellitus with ketoacidosis without coma: Principal | ICD-10-CM | POA: Diagnosis present

## 2015-07-02 DIAGNOSIS — Z79899 Other long term (current) drug therapy: Secondary | ICD-10-CM

## 2015-07-02 DIAGNOSIS — Z7982 Long term (current) use of aspirin: Secondary | ICD-10-CM | POA: Diagnosis not present

## 2015-07-02 DIAGNOSIS — N189 Chronic kidney disease, unspecified: Secondary | ICD-10-CM | POA: Diagnosis present

## 2015-07-02 DIAGNOSIS — R4182 Altered mental status, unspecified: Secondary | ICD-10-CM | POA: Diagnosis present

## 2015-07-02 DIAGNOSIS — Z794 Long term (current) use of insulin: Secondary | ICD-10-CM | POA: Diagnosis not present

## 2015-07-02 DIAGNOSIS — Z87891 Personal history of nicotine dependence: Secondary | ICD-10-CM

## 2015-07-02 DIAGNOSIS — G9340 Encephalopathy, unspecified: Secondary | ICD-10-CM | POA: Diagnosis present

## 2015-07-02 DIAGNOSIS — Z9114 Patient's other noncompliance with medication regimen: Secondary | ICD-10-CM | POA: Diagnosis present

## 2015-07-02 DIAGNOSIS — E870 Hyperosmolality and hypernatremia: Secondary | ICD-10-CM | POA: Diagnosis present

## 2015-07-02 DIAGNOSIS — F419 Anxiety disorder, unspecified: Secondary | ICD-10-CM | POA: Diagnosis present

## 2015-07-02 DIAGNOSIS — I129 Hypertensive chronic kidney disease with stage 1 through stage 4 chronic kidney disease, or unspecified chronic kidney disease: Secondary | ICD-10-CM | POA: Diagnosis present

## 2015-07-02 DIAGNOSIS — E1122 Type 2 diabetes mellitus with diabetic chronic kidney disease: Secondary | ICD-10-CM | POA: Diagnosis present

## 2015-07-02 DIAGNOSIS — E875 Hyperkalemia: Secondary | ICD-10-CM | POA: Diagnosis present

## 2015-07-02 DIAGNOSIS — Z820 Family history of epilepsy and other diseases of the nervous system: Secondary | ICD-10-CM

## 2015-07-02 DIAGNOSIS — N179 Acute kidney failure, unspecified: Secondary | ICD-10-CM | POA: Diagnosis present

## 2015-07-02 DIAGNOSIS — E876 Hypokalemia: Secondary | ICD-10-CM | POA: Diagnosis present

## 2015-07-02 DIAGNOSIS — Z833 Family history of diabetes mellitus: Secondary | ICD-10-CM

## 2015-07-02 DIAGNOSIS — E860 Dehydration: Secondary | ICD-10-CM | POA: Diagnosis present

## 2015-07-02 DIAGNOSIS — G8929 Other chronic pain: Secondary | ICD-10-CM | POA: Diagnosis present

## 2015-07-02 DIAGNOSIS — Z9119 Patient's noncompliance with other medical treatment and regimen: Secondary | ICD-10-CM | POA: Diagnosis not present

## 2015-07-02 DIAGNOSIS — T383X6A Underdosing of insulin and oral hypoglycemic [antidiabetic] drugs, initial encounter: Secondary | ICD-10-CM | POA: Diagnosis present

## 2015-07-02 DIAGNOSIS — Z8249 Family history of ischemic heart disease and other diseases of the circulatory system: Secondary | ICD-10-CM

## 2015-07-02 DIAGNOSIS — Z8673 Personal history of transient ischemic attack (TIA), and cerebral infarction without residual deficits: Secondary | ICD-10-CM | POA: Diagnosis not present

## 2015-07-02 DIAGNOSIS — Z79891 Long term (current) use of opiate analgesic: Secondary | ICD-10-CM | POA: Diagnosis not present

## 2015-07-02 LAB — CBC WITH DIFFERENTIAL/PLATELET
Basophils Absolute: 0 10*3/uL (ref 0.0–0.1)
Basophils Absolute: 0 10*3/uL (ref 0.0–0.1)
Basophils Relative: 0 %
Basophils Relative: 0 %
Eosinophils Absolute: 0 10*3/uL (ref 0.0–0.7)
Eosinophils Absolute: 0 10*3/uL (ref 0.0–0.7)
Eosinophils Relative: 0 %
Eosinophils Relative: 0 %
HCT: 39.2 % (ref 39.0–52.0)
HCT: 30.8 % — ABNORMAL LOW (ref 39.0–52.0)
Hemoglobin: 10.3 g/dL — ABNORMAL LOW (ref 13.0–17.0)
Hemoglobin: 9.2 g/dL — ABNORMAL LOW (ref 13.0–17.0)
Lymphocytes Relative: 6 %
Lymphocytes Relative: 8 %
Lymphs Abs: 0.6 10*3/uL — ABNORMAL LOW (ref 0.7–4.0)
Lymphs Abs: 0.7 10*3/uL (ref 0.7–4.0)
MCH: 30.1 pg (ref 26.0–34.0)
MCH: 30.8 pg (ref 26.0–34.0)
MCHC: 26.3 g/dL — ABNORMAL LOW (ref 30.0–36.0)
MCHC: 29.9 g/dL — ABNORMAL LOW (ref 30.0–36.0)
MCV: 114.6 fL — ABNORMAL HIGH (ref 78.0–100.0)
MCV: 103 fL — ABNORMAL HIGH (ref 78.0–100.0)
Monocytes Absolute: 0.4 10*3/uL (ref 0.1–1.0)
Monocytes Absolute: 0.8 10*3/uL (ref 0.1–1.0)
Monocytes Relative: 4 %
Monocytes Relative: 11 %
Neutro Abs: 9.3 10*3/uL — ABNORMAL HIGH (ref 1.7–7.7)
Neutro Abs: 6.5 10*3/uL (ref 1.7–7.7)
Neutrophils Relative %: 90 %
Neutrophils Relative %: 81 %
Platelets: 170 10*3/uL (ref 150–400)
Platelets: 148 10*3/uL — ABNORMAL LOW (ref 150–400)
RBC: 3.42 MIL/uL — ABNORMAL LOW (ref 4.22–5.81)
RBC: 2.99 MIL/uL — ABNORMAL LOW (ref 4.22–5.81)
RDW: 14.1 % (ref 11.5–15.5)
RDW: 14.1 % (ref 11.5–15.5)
WBC: 10.3 10*3/uL (ref 4.0–10.5)
WBC: 8 10*3/uL (ref 4.0–10.5)

## 2015-07-02 LAB — COMPREHENSIVE METABOLIC PANEL
ALT: 18 U/L (ref 17–63)
AST: 26 U/L (ref 15–41)
Albumin: 3.4 g/dL — ABNORMAL LOW (ref 3.5–5.0)
Alkaline Phosphatase: 70 U/L (ref 38–126)
Anion gap: 20 — ABNORMAL HIGH (ref 5–15)
BUN: 53 mg/dL — ABNORMAL HIGH (ref 6–20)
CO2: 12 mmol/L — ABNORMAL LOW (ref 22–32)
Calcium: 7.8 mg/dL — ABNORMAL LOW (ref 8.9–10.3)
Chloride: 100 mmol/L — ABNORMAL LOW (ref 101–111)
Creatinine, Ser: 3.57 mg/dL — ABNORMAL HIGH (ref 0.61–1.24)
GFR calc Af Amer: 18 mL/min — ABNORMAL LOW (ref >60)
GFR calc non Af Amer: 16 mL/min — ABNORMAL LOW (ref >60)
Glucose, Bld: 1241 mg/dL (ref 65–99)
Potassium: 4.4 mmol/L (ref 3.5–5.1)
Sodium: 132 mmol/L — ABNORMAL LOW (ref 135–145)
Total Bilirubin: 1.1 mg/dL (ref 0.3–1.2)
Total Protein: 6.1 g/dL — ABNORMAL LOW (ref 6.5–8.1)

## 2015-07-02 LAB — URINALYSIS, ROUTINE W REFLEX MICROSCOPIC
Bilirubin Urine: NEGATIVE
Glucose, UA: >1000 mg/dL — AB
Ketones, ur: 40 mg/dL — AB
Leukocytes, UA: NEGATIVE
Nitrite: NEGATIVE
Protein, ur: 30 mg/dL — AB
Specific Gravity, Urine: 1.023 (ref 1.005–1.030)
Urobilinogen, UA: 0.2 mg/dL (ref 0.0–1.0)
pH: 5 (ref 5.0–8.0)

## 2015-07-02 LAB — TROPONIN I: Troponin I: 0.03 ng/mL (ref <0.031)

## 2015-07-02 LAB — BLOOD GAS, ARTERIAL
Acid-base deficit: 17.6 mmol/L — ABNORMAL HIGH (ref 0.0–2.0)
Bicarbonate: 9.5 mEq/L — ABNORMAL LOW (ref 20.0–24.0)
Drawn by: 295031
O2 Content: 8 L/min
O2 Saturation: 98.6 %
Patient temperature: 98.6
TCO2: 9.4 mmol/L (ref 0–100)
pCO2 arterial: 27.3 mmHg — ABNORMAL LOW (ref 35.0–45.0)
pH, Arterial: 7.167 — CL (ref 7.350–7.450)
pO2, Arterial: 179 mmHg — ABNORMAL HIGH (ref 80.0–100.0)

## 2015-07-02 LAB — RAPID URINE DRUG SCREEN, HOSP PERFORMED
Amphetamines: NOT DETECTED
Barbiturates: NOT DETECTED
Benzodiazepines: NOT DETECTED
Cocaine: NOT DETECTED
Opiates: POSITIVE — AB
Tetrahydrocannabinol: NOT DETECTED

## 2015-07-02 LAB — I-STAT CG4 LACTIC ACID, ED
Lactic Acid, Venous: 3.42 mmol/L (ref 0.5–2.0)
Lactic Acid, Venous: 3.25 mmol/L (ref 0.5–2.0)

## 2015-07-02 LAB — LIPASE, BLOOD: Lipase: 17 U/L — ABNORMAL LOW (ref 22–51)

## 2015-07-02 LAB — AMYLASE: Amylase: 37 U/L (ref 28–100)

## 2015-07-02 LAB — CBG MONITORING, ED
Glucose-Capillary: >600 mg/dL (ref 65–99)
Glucose-Capillary: >600 mg/dL (ref 65–99)
Glucose-Capillary: >600 mg/dL (ref 65–99)
Glucose-Capillary: >600 mg/dL (ref 65–99)

## 2015-07-02 LAB — PROCALCITONIN: Procalcitonin: 6.87 ng/mL

## 2015-07-02 LAB — URINE MICROSCOPIC-ADD ON

## 2015-07-02 LAB — MRSA PCR SCREENING: MRSA by PCR: NEGATIVE

## 2015-07-02 MED ORDER — SODIUM CHLORIDE 0.9 % IV BOLUS (SEPSIS): 2000.0000 mL | Freq: Once | INTRAVENOUS | Status: DC

## 2015-07-02 MED ORDER — DEXTROSE-NACL 5-0.45 % IV SOLN: INTRAVENOUS | Status: DC

## 2015-07-02 MED ORDER — SODIUM CHLORIDE 0.9 % IV SOLN
INTRAVENOUS | Status: DC
  Administered 2015-07-02: 21:00:00 via INTRAVENOUS

## 2015-07-02 MED ORDER — SODIUM CHLORIDE 0.9 % IV BOLUS (SEPSIS)
2000.0000 mL | Freq: Once | INTRAVENOUS | Status: AC
  Administered 2015-07-02: 2000 mL via INTRAVENOUS

## 2015-07-02 MED ORDER — CETYLPYRIDINIUM CHLORIDE 0.05 % MT LIQD: 7.0000 mL | Freq: Two times a day (BID) | OROMUCOSAL | Status: DC

## 2015-07-02 MED ORDER — SODIUM CHLORIDE 0.9 % IV SOLN: 250.0000 mL | INTRAVENOUS | Status: DC | PRN

## 2015-07-02 MED ORDER — HEPARIN SODIUM (PORCINE) 5000 UNIT/ML IJ SOLN
5000.0000 [IU] | Freq: Three times a day (TID) | INTRAMUSCULAR | Status: DC
  Administered 2015-07-02 – 2015-07-04 (×6): 5000 [IU] via SUBCUTANEOUS
  Filled 2015-07-02 (×6): qty 1

## 2015-07-02 MED ORDER — SODIUM CHLORIDE 0.9 % IV BOLUS (SEPSIS): 1000.0000 mL | Freq: Once | INTRAVENOUS | Status: DC

## 2015-07-02 MED ORDER — HYDRALAZINE HCL 20 MG/ML IJ SOLN: 10.0000 mg | INTRAMUSCULAR | Status: DC | PRN

## 2015-07-02 MED ORDER — ALBUTEROL (5 MG/ML) CONTINUOUS INHALATION SOLN
10.0000 mg/h | INHALATION_SOLUTION | RESPIRATORY_TRACT | Status: DC
  Administered 2015-07-02: 10 mg/h via RESPIRATORY_TRACT
  Filled 2015-07-02: qty 20

## 2015-07-02 MED ORDER — CHLORHEXIDINE GLUCONATE 0.12 % MT SOLN
15.0000 mL | Freq: Two times a day (BID) | OROMUCOSAL | Status: DC
  Administered 2015-07-02 – 2015-07-03 (×2): 15 mL via OROMUCOSAL
  Filled 2015-07-02: qty 15

## 2015-07-02 MED ORDER — DEXTROSE-NACL 5-0.45 % IV SOLN
INTRAVENOUS | Status: DC
  Administered 2015-07-03: 05:00:00 via INTRAVENOUS

## 2015-07-02 MED ORDER — SODIUM CHLORIDE 0.9 % IV BOLUS (SEPSIS)
1000.0000 mL | Freq: Once | INTRAVENOUS | Status: AC
  Administered 2015-07-02: 1000 mL via INTRAVENOUS

## 2015-07-02 MED ORDER — CALCIUM GLUCONATE 10 % IV SOLN
1.0000 g | Freq: Once | INTRAVENOUS | Status: DC
  Filled 2015-07-02: qty 10

## 2015-07-02 MED ORDER — SODIUM CHLORIDE 0.9 % IV SOLN: INTRAVENOUS | Status: DC

## 2015-07-02 MED ORDER — SODIUM BICARBONATE 8.4 % IV SOLN
50.0000 meq | Freq: Once | INTRAVENOUS | Status: AC
  Administered 2015-07-02: 50 meq via INTRAVENOUS
  Filled 2015-07-02: qty 50

## 2015-07-02 MED ORDER — SODIUM CHLORIDE 0.9 % IV SOLN
INTRAVENOUS | Status: DC
  Filled 2015-07-02 (×2): qty 2.5

## 2015-07-02 MED ORDER — SODIUM CHLORIDE 0.9 % IV SOLN
INTRAVENOUS | Status: DC
  Administered 2015-07-02: 5.4 [IU]/h via INTRAVENOUS
  Filled 2015-07-02: qty 2.5

## 2015-07-02 MED ORDER — ALBUTEROL SULFATE (2.5 MG/3ML) 0.083% IN NEBU: 10.0000 mg | INHALATION_SOLUTION | Freq: Once | RESPIRATORY_TRACT | Status: DC

## 2015-07-02 MED ORDER — SODIUM CHLORIDE 0.9 % IV SOLN
INTRAVENOUS | Status: AC
  Administered 2015-07-02: 20:00:00 via INTRAVENOUS

## 2015-07-02 MED ORDER — SODIUM CHLORIDE 0.9 % IV SOLN
1.0000 g | INTRAVENOUS | Status: AC
  Administered 2015-07-02: 1 g via INTRAVENOUS
  Filled 2015-07-02: qty 10

## 2015-07-02 NOTE — ED Notes (Signed)
Glucostabilizer drip rate set at 5.4/hr and verified with Charlann Boxer, Therapist, sports.

## 2015-07-02 NOTE — ED Provider Notes (Signed)
CSN: [id number]     Arrival date & time 07/02/15  1344 History   First MD Initiated Contact with Patient 07/02/15 1505     Chief Complaint  Patient presents with  . Hyperglycemia  . Altered Mental Status     HPI Patient was brought in by his sister being confused.  She is states he's lived with her for the last few months.  He doesn't take his insulin regular.  Patient and sister deny drug abuse.  He occasionally drinks alcohol. Past Medical History  Diagnosis Date  . Diabetes mellitus without complication   . Hypertension   . Hernia   . Back pain   . Seizures   . Diabetic neuropathy   . Stroke    Past Surgical History  Procedure Laterality Date  . Hip arthroplasty Right 02/05/2013    Procedure: ARTHROPLASTY BIPOLAR HIP;  Surgeon: [name], MD;  Location: [facility];  Service: Orthopedics;  Laterality: Right;   Family History  Problem Relation Age of Onset  . Diabetes Mother   . Alzheimer's disease Mother   . Hypertension Mother   . Hyperlipidemia Father   . Hypertension Father   . Healthy Maternal Grandmother   . Pneumonia Maternal Grandfather    Social History  Substance Use Topics  . Smoking status: Current Every Day Smoker -- 0.25 packs/day for 30 years    Types: Cigarettes    Last Attempt to Quit: 01/31/2013  . Smokeless tobacco: Never Used  . Alcohol Use: Yes    Review of Systems  Constitutional: Negative for fever and chills.  Gastrointestinal: Positive for vomiting. Negative for abdominal pain and diarrhea.  All other systems reviewed and are negative.     Allergies  Review of patient's allergies indicates no known allergies.  Home Medications   Prior to Admission medications   Medication Sig Start Date End Date Taking? Authorizing Provider  aspirin EC 325 MG tablet Take 1 tablet (325 mg total) by mouth daily. 06/08/15  Yes Rosalin Hawking, MD  atorvastatin (LIPITOR) 10 MG tablet Take 1 tablet (10 mg total) by mouth daily at 6 PM. 03/23/15  Yes [name], DO  FLUoxetine (PROZAC) 20 MG capsule Take 20 mg by mouth daily.   Yes Historical Provider, MD  folic acid (FOLVITE) 1 MG tablet Take 1 mg by mouth daily.   Yes Historical Provider, MD  Oxycodone HCl 20 MG TABS Take 20 mg by mouth 4 (four) times daily.  05/24/15  Yes Historical Provider, MD  ALPRAZolam [name]) 1 MG tablet Take 1 tablet (1 mg total) by mouth 2 (two) times daily as needed for anxiety. 05/11/15   Golden Circle, [name]  amLODipine (NORVASC) 5 MG tablet Take 1 tablet (5 mg total) by mouth daily. 07/04/15   Donita Brooks, NP  blood glucose meter kit and supplies Dispense based on patient and insurance preference. Use up to four times daily as directed. (FOR ICD-9 250.00, 250.01). 07/04/15   Donita Brooks, NP  insulin aspart protamine- aspart (NOVOLOG MIX 70/30) (70-30) 100 UNIT/ML injection Inject 0.18 mLs (18 Units total) into the skin 2 (two) times daily. 07/04/15   Donita Brooks, NP   BP 151/68 mmHg  Pulse 62  Temp(Src) 98.2 F (36.8 C) (Oral)  Resp 16  Ht '5\' 9"'  (1.753 m)  Wt 149 lb 1.6 oz (67.631 kg)  BMI 22.01 kg/m2  SpO2 100% Physical Exam  Constitutional: He is oriented to person, place, and time. He appears  well-developed and well-nourished. No distress.  HENT:  Head: Normocephalic and atraumatic.  Mouth/Throat: Mucous membranes are dry.  Eyes: Pupils are equal, round, and reactive to light.  Neck: Normal range of motion.  Cardiovascular: Normal rate and intact distal pulses.   Pulmonary/Chest: Tachypnea noted. No respiratory distress.  Abdominal: Normal appearance. He exhibits no distension.  Musculoskeletal: Normal range of motion.  Neurological: He is alert and oriented to person, place, and time. No cranial nerve deficit.  Skin: Skin is warm and dry. No rash noted.  Psychiatric: His affect is blunt.  Nursing note and vitals reviewed.   ED Course  Procedures (including critical care time) Medications  0.9 %  sodium chloride infusion ( Intravenous  Stopped 07/02/15 2126)  sodium chloride 0.9 % bolus 1,000 mL (0 mLs Intravenous Stopped 07/02/15 1647)  sodium chloride 0.9 % bolus 2,000 mL (0 mLs Intravenous Stopped 07/02/15 1752)  calcium gluconate 1 g in sodium chloride 0.9 % 100 mL IVPB (0 g Intravenous Stopped 07/02/15 1705)  sodium bicarbonate injection 50 mEq (50 mEq Intravenous Given 07/02/15 1704)     CRITICAL CARE Performed by: Leonard Schwartz L Total critical care time: 45 min Critical care time was exclusive of separately billable procedures and treating other patients. Critical care was necessary to treat or prevent imminent or life-threatening deterioration. Critical care was time spent personally by me on the following activities: development of treatment plan with patient and/or surrogate as well as nursing, discussions with consultants, evaluation of patient's response to treatment, examination of patient, obtaining history from patient or surrogate, ordering and performing treatments and interventions, ordering and review of laboratory studies, ordering and review of radiographic studies, pulse oximetry and re-evaluation of patient's condition.  Labs Review Labs Reviewed  CBC WITH DIFFERENTIAL/PLATELET - Abnormal; Notable for the following:    RBC 3.42 (*)    Hemoglobin 10.3 (*)    MCV 114.6 (*)    MCHC 26.3 (*)    Neutro Abs 9.3 (*)    Lymphs Abs 0.6 (*)    All other components within normal limits  BASIC METABOLIC PANEL - Abnormal; Notable for the following:    Sodium 120 (*)    Potassium >7.5 (*)    Chloride 83 (*)    CO2 12 (*)    Glucose, Bld 1679 (*)    BUN 55 (*)    Creatinine, Ser 3.86 (*)    Calcium 8.6 (*)    GFR calc non Af Amer 14 (*)    GFR calc Af Amer 17 (*)    Anion gap 25 (*)    All other components within normal limits  URINALYSIS, ROUTINE W REFLEX MICROSCOPIC (NOT AT [facility]) - Abnormal; Notable for the following:    Glucose, UA >1000 (*)    Hgb urine dipstick MODERATE (*)    Ketones, ur 40 (*)     Protein, ur 30 (*)    All other components within normal limits  BLOOD GAS, ARTERIAL - Abnormal; Notable for the following:    pH, Arterial 7.167 (*)    pCO2 arterial 27.3 (*)    pO2, Arterial 179 (*)    Bicarbonate 9.5 (*)    Acid-base deficit 17.6 (*)    All other components within normal limits  URINE RAPID DRUG SCREEN, HOSP PERFORMED - Abnormal; Notable for the following:    Opiates POSITIVE (*)    All other components within normal limits  CBC WITH DIFFERENTIAL/PLATELET - Abnormal; Notable for the following:    RBC 2.99 (*)  Hemoglobin 9.2 (*)    HCT 30.8 (*)    MCV 103.0 (*)    MCHC 29.9 (*)    Platelets 148 (*)    All other components within normal limits  COMPREHENSIVE METABOLIC PANEL - Abnormal; Notable for the following:    Sodium 132 (*)    Chloride 100 (*)    CO2 12 (*)    Glucose, Bld 1241 (*)    BUN 53 (*)    Creatinine, Ser 3.57 (*)    Calcium 7.8 (*)    Total Protein 6.1 (*)    Albumin 3.4 (*)    GFR calc non Af Amer 16 (*)    GFR calc Af Amer 18 (*)    Anion gap 20 (*)    All other components within normal limits  LIPASE, BLOOD - Abnormal; Notable for the following:    Lipase 17 (*)    All other components within normal limits  CBC - Abnormal; Notable for the following:    RBC 3.34 (*)    Hemoglobin 9.9 (*)    HCT 30.0 (*)    Platelets 140 (*)    All other components within normal limits  BASIC METABOLIC PANEL - Abnormal; Notable for the following:    Sodium 147 (*)    Potassium 3.2 (*)    Chloride 118 (*)    CO2 19 (*)    Glucose, Bld 294 (*)    BUN 40 (*)    Creatinine, Ser 2.55 (*)    Calcium 7.9 (*)    GFR calc non Af Amer 24 (*)    GFR calc Af Amer 28 (*)    All other components within normal limits  BLOOD GAS, ARTERIAL - Abnormal; Notable for the following:    pH, Arterial 7.297 (*)    Bicarbonate 18.6 (*)    Acid-base deficit 6.9 (*)    All other components within normal limits  PHOSPHORUS - Abnormal; Notable for the following:     Phosphorus 2.4 (*)    All other components within normal limits  GLUCOSE, CAPILLARY - Abnormal; Notable for the following:    Glucose-Capillary >600 (*)    All other components within normal limits  GLUCOSE, CAPILLARY - Abnormal; Notable for the following:    Glucose-Capillary >600 (*)    All other components within normal limits  GLUCOSE, CAPILLARY - Abnormal; Notable for the following:    Glucose-Capillary >600 (*)    All other components within normal limits  GLUCOSE, CAPILLARY - Abnormal; Notable for the following:    Glucose-Capillary 521 (*)    All other components within normal limits  GLUCOSE, CAPILLARY - Abnormal; Notable for the following:    Glucose-Capillary 483 (*)    All other components within normal limits  TROPONIN I - Abnormal; Notable for the following:    Troponin I 0.05 (*)    All other components within normal limits  LACTIC ACID, PLASMA - Abnormal; Notable for the following:    Lactic Acid, Venous 2.9 (*)    All other components within normal limits  GLUCOSE, CAPILLARY - Abnormal; Notable for the following:    Glucose-Capillary 431 (*)    All other components within normal limits  GLUCOSE, CAPILLARY - Abnormal; Notable for the following:    Glucose-Capillary 437 (*)    All other components within normal limits  GLUCOSE, CAPILLARY - Abnormal; Notable for the following:    Glucose-Capillary 304 (*)    All other components within normal limits  GLUCOSE,  CAPILLARY - Abnormal; Notable for the following:    Glucose-Capillary 288 (*)    All other components within normal limits  GLUCOSE, CAPILLARY - Abnormal; Notable for the following:    Glucose-Capillary 197 (*)    All other components within normal limits  GLUCOSE, CAPILLARY - Abnormal; Notable for the following:    Glucose-Capillary 173 (*)    All other components within normal limits  GLUCOSE, CAPILLARY - Abnormal; Notable for the following:    Glucose-Capillary 154 (*)    All other components within  normal limits  GLUCOSE, CAPILLARY - Abnormal; Notable for the following:    Glucose-Capillary 59 (*)    All other components within normal limits  BASIC METABOLIC PANEL - Abnormal; Notable for the following:    Chloride 113 (*)    CO2 19 (*)    Glucose, Bld 301 (*)    BUN 35 (*)    Creatinine, Ser 2.07 (*)    Calcium 7.8 (*)    GFR calc non Af Amer 31 (*)    GFR calc Af Amer 36 (*)    All other components within normal limits  GLUCOSE, CAPILLARY - Abnormal; Notable for the following:    Glucose-Capillary 131 (*)    All other components within normal limits  GLUCOSE, CAPILLARY - Abnormal; Notable for the following:    Glucose-Capillary 268 (*)    All other components within normal limits  GLUCOSE, CAPILLARY - Abnormal; Notable for the following:    Glucose-Capillary 284 (*)    All other components within normal limits  BASIC METABOLIC PANEL - Abnormal; Notable for the following:    Chloride 114 (*)    CO2 21 (*)    Glucose, Bld 169 (*)    BUN 24 (*)    Creatinine, Ser 1.63 (*)    Calcium 7.8 (*)    GFR calc non Af Amer 41 (*)    GFR calc Af Amer 48 (*)    All other components within normal limits  GLUCOSE, CAPILLARY - Abnormal; Notable for the following:    Glucose-Capillary 230 (*)    All other components within normal limits  GLUCOSE, CAPILLARY - Abnormal; Notable for the following:    Glucose-Capillary 328 (*)    All other components within normal limits  GLUCOSE, CAPILLARY - Abnormal; Notable for the following:    Glucose-Capillary 238 (*)    All other components within normal limits  GLUCOSE, CAPILLARY - Abnormal; Notable for the following:    Glucose-Capillary 206 (*)    All other components within normal limits  CBG MONITORING, ED - Abnormal; Notable for the following:    Glucose-Capillary >600 (*)    All other components within normal limits  I-STAT CG4 LACTIC ACID, ED - Abnormal; Notable for the following:    Lactic Acid, Venous 3.42 (*)    All other  components within normal limits  CBG MONITORING, ED - Abnormal; Notable for the following:    Glucose-Capillary >600 (*)    All other components within normal limits  I-STAT CG4 LACTIC ACID, ED - Abnormal; Notable for the following:    Lactic Acid, Venous 3.25 (*)    All other components within normal limits  CBG MONITORING, ED - Abnormal; Notable for the following:    Glucose-Capillary >600 (*)    All other components within normal limits  CBG MONITORING, ED - Abnormal; Notable for the following:    Glucose-Capillary >600 (*)    All other components within normal limits  CULTURE,  BLOOD (ROUTINE X 2)  CULTURE, BLOOD (ROUTINE X 2)  URINE CULTURE  MRSA PCR SCREENING  URINE MICROSCOPIC-ADD ON  AMYLASE  TROPONIN I  MAGNESIUM  PROCALCITONIN  PROCALCITONIN  GLUCOSE, CAPILLARY    Imaging Review No results found. I have personally reviewed and evaluated these images and lab results as part of my medical decision-making.   EKG Interpretation   Date/Time:  Monday July 02 2015 18:37:47 EDT Ventricular Rate:  82 PR Interval:  171 QRS Duration: 104 QT Interval:  415 QTC Calculation: 485 R Axis:   -42 Text Interpretation:  Sinus rhythm Left axis deviation Probable  anteroseptal infarct, old ED PHYSICIAN INTERPRETATION AVAILABLE IN [place]  [place] Confirmed by TEST, Record ([id number]) on 07/03/2015 6:46:39 AM     I discussed the case with who care medicine.  Patient be evaluated by the intensivist. MDM   Final diagnoses:  Diabetic ketoacidosis without coma associated with other specified diabetes mellitus  Acute renal failure, unspecified acute renal failure type  Hyperkalemia        Leonard Schwartz, MD 07/05/15 [phone]

## 2015-07-02 NOTE — ED Notes (Signed)
Per sister, ???? Pt went with "two girls" last week.  They brought him back today.  Pt has been confused and disoriented.  Pt lives with sister.  Sister does not know who had him.  Family wasn't home.  Pt has been having hallucinations.  Altered.  CBG reading high in triage.

## 2015-07-02 NOTE — ED Notes (Signed)
[name] [name] sister [phone] cell [phone] home EMERGENCY CONTACT

## 2015-07-02 NOTE — ED Notes (Signed)
Patient transported to X-ray 

## 2015-07-02 NOTE — ED Notes (Signed)
Family at bedside. 

## 2015-07-02 NOTE — ED Notes (Signed)
Receiving nurse called and will call back before 1930.

## 2015-07-02 NOTE — H&P (Signed)
PULMONARY / CRITICAL CARE MEDICINE   Name: [name] [name] MRN: [id number] DOB: 04/10/1946    ADMISSION DATE:  07/02/2015 CONSULTATION DATE:  07/02/15  REFERRING MD :  ED  CHIEF COMPLAINT:  Confusion, Altered mental status, DKA.   INITIAL PRESENTATION: 69 year old with uncontrolled diabetes who lives with his sister. [name] [name] out with a couple of girls last week. After he came back the patient had been confused and disoriented and had not been taking his insulin regularly. In the ED was found to have potassium of 7.5 with peaked T waves, glucose of ~1700 with AKI, anion gap and lactic acidosis. So far he has received 3 L normal saline, started on an insulin drip. Got albuterol, calcium gluconate and sodium bicarbonate.  STUDIES:  CXR (07/02/15) No acute cardiopulmonary disease  SIGNIFICANT EVENTS: 9/19-Admitted with altered mental status, DKA.   HISTORY OF PRESENT ILLNESS:   69 year old with uncontrolled diabetes. He has had multiple admissions over the year with DKA secondary to noncompliance with insulin. His also had a recent diagnosis of a right thalamic stroke. His sister states that he fell last week and bruised his left shin. He did not hit his head when he fell down and there was no syncope. Denies any drug use or alcohol intake.  PAST MEDICAL HISTORY :   has a past medical history of Diabetes mellitus without complication; Hypertension; Hernia; Back pain; Seizures; Diabetic neuropathy; and Stroke.  has past surgical history that includes Hip Arthroplasty (Right, 02/05/2013). Prior to Admission medications   Medication Sig Start Date End Date Taking? Authorizing Provider  ALPRAZolam [name] [name]) 1 MG tablet Take 1 tablet (1 mg total) by mouth 2 (two) times daily as needed for anxiety. Patient taking differently: Take 2 mg by mouth 2 (two) times daily as needed for anxiety.  05/11/15   [name] Circle, [name]  alprazolam [name] [name]) 2 MG tablet  06/22/15   Historical Provider, MD  aspirin EC 325 MG  tablet Take 1 tablet (325 mg total) by mouth daily. 06/08/15   [name] Hawking, MD  atorvastatin (LIPITOR) 10 MG tablet Take 1 tablet (10 mg total) by mouth daily at 6 PM. 03/23/15   [name] Girt, DO  FLUoxetine (PROZAC) 20 MG capsule Take 20 mg by mouth daily.    Historical Provider, MD  insulin aspart protamine- aspart (NOVOLOG MIX 70/30) (70-30) 100 UNIT/ML injection Inject 0.15 mLs (15 Units total) into the skin 2 (two) times daily. Patient taking differently: Inject 15 Units into the skin 2 (two) times daily.  03/23/15   [name] Girt, DO  oxycodone (ROXICODONE) 30 MG immediate release tablet Take 30 mg by mouth every 4 (four) hours as needed for pain.     Historical Provider, MD  Oxycodone HCl 20 MG TABS  05/24/15   Historical Provider, MD  ramipril (ALTACE) 10 MG capsule Take 10 mg by mouth every morning.    Historical Provider, MD  trimethoprim-polymyxin b (POLYTRIM) ophthalmic solution  03/19/15   Historical Provider, MD   No Known Allergies  FAMILY HISTORY:  indicated that his mother is alive. He indicated that his father is deceased. He indicated that his maternal grandmother is deceased. He indicated that his maternal grandfather is deceased. He indicated that his paternal grandmother is deceased. He indicated that his paternal grandfather is deceased.  SOCIAL HISTORY:  reports that he quit smoking about 2 years ago. His smoking use included Cigarettes. He has a 7.5 pack-year smoking history. He has never used smokeless tobacco. He reports that  he does not drink alcohol or use illicit drugs.  REVIEW OF SYSTEMS:  Difficult to obtain as the pt is confused Denies any dyspnea, cough, chest pain, hemoptysis No Abdominal pain, nausea, diarrhea. All other ROS are negative.   SUBJECTIVE:   VITAL SIGNS: Temp:  [99.6 F (37.6 C)-100.1 F (37.8 C)] 99.6 F (37.6 C) (09/19 1627) Pulse Rate:  [93-99] 93 (09/19 1500) Resp:  [20-26] 20 (09/19 1730) BP: (106-162)/(49-64) 136/64 mmHg (09/19  1730) SpO2:  [95 %-98 %] 96 % (09/19 1640) HEMODYNAMICS:   VENTILATOR SETTINGS: 3 Lt O2 nasal cannula   INTAKE / OUTPUT:  Intake/Output Summary (Last 24 hours) at 07/02/15 1755 Last data filed at 07/02/15 1752  Gross per 24 hour  Intake   3110 ml  Output    600 ml  Net   2510 ml    PHYSICAL EXAMINATION: General:  Awake, Responsive, confused Neuro:  No gross focal deficits HEENT:  PERRLA, No gross focal deficits Cardiovascular: RRR, No MRG Lungs: Non laboured breathing, Clear antr.  Abdomen:  Soft, + BS Skin:  Bruise over left shin. No edema  LABS:  CBC  Recent Labs Lab 07/02/15 1440  WBC 10.3  HGB 10.3*  HCT 39.2  PLT 170   Coag's No results for input(s): APTT, INR in the last 168 hours. BMET  Recent Labs Lab 07/02/15 1440  NA 120*  K >7.5*  CL 83*  CO2 12*  BUN 55*  CREATININE 3.86*  GLUCOSE 1679*   Electrolytes  Recent Labs Lab 07/02/15 1440  CALCIUM 8.6*   Sepsis Markers  Recent Labs Lab 07/02/15 1604  LATICACIDVEN 3.42*   ABG  Recent Labs Lab 07/02/15 1644  PHART 7.167*  PCO2ART 27.3*  PO2ART 179*   Liver Enzymes No results for input(s): AST, ALT, ALKPHOS, BILITOT, ALBUMIN in the last 168 hours. Cardiac Enzymes No results for input(s): TROPONINI, PROBNP in the last 168 hours. Glucose  Recent Labs Lab 07/02/15 1404 07/02/15 1710  GLUCAP >600* >600*    Imaging Dg Chest 2 View  07/02/2015   CLINICAL DATA:  Confusion, fever.  Hypertension, diabetes  EXAM: CHEST  2 VIEW  COMPARISON:  03/20/2015  FINDINGS: The heart size and mediastinal contours are within normal limits. Both lungs are clear. The visualized skeletal structures are unremarkable.  IMPRESSION: No active cardiopulmonary disease.   Electronically Signed   By: [name] [name] M.D.   On: 07/02/2015 15:45    ASSESSMENT / PLAN:  PULMONARY OETT A: Stable Good sats on 3lt O2 P:   Continue to monitor. Wean down O2 as tolerated.  CARDIOVASCULAR CVL A: BP is  stable. Lactic acidosis Appears very dehydrated H/O hypertension. P:  Continue to bolus fluids. Will need atleast 2 more lt NS. Then drip at 150/hr. Hydralazine PRN for hypertension.  RENAL A: AKI on CKD. Suspect prerenal state, dehydration Hyperkalemia P:   Hydration as above. Check renal ultrasound. Check Urine drug screen. Hold ramapril given the AKI.  Recheck EKG and BMP now.   GASTROINTESTINAL A:   P:   Keep NPO.   INFECTIOUS A:  No evidence of infection P:   BCx2 07/02/15 UC Pending  Observe off antibiotics. Check procalcitonin  ENDOCRINE A: Uncontrolled DM  Now with DKA.  P:   Insulin drip as per protocol Continue fluid hydration.  NEUROLOGIC A:  Confusion likely secondary to metabolic derangements Recent thalamic stroke.  P:   Continue to monitor.  CT of head today.  FAMILY  - Updates: Sister updated  at bedside - Inter-disciplinary family meet or Palliative Care meeting due by:  07/09/15  TODAY'S SUMMARY:   Total Critical care time- 45 mins.  Marshell Garfinkel MD [facility] Pager [phone] If no answer or after 3pm call: [phone] 07/02/2015, 5:55 PM

## 2015-07-02 NOTE — ED Notes (Signed)
Patient transported to CT 

## 2015-07-02 NOTE — ED Notes (Signed)
Pt is aware we need a urine sample. Pt was provided urinal and tried to void, but was unable. Pt stated he will notify when he is able to void.

## 2015-07-02 NOTE — ED Notes (Addendum)
Author waiting for ICU charge nurse to call back for report on patient.

## 2015-07-02 NOTE — ED Notes (Signed)
Results given to Dr Audie Pinto

## 2015-07-02 NOTE — ED Notes (Signed)
Glucostabilizer rate changed to 10.8/hr verified by Charlann Boxer, RN

## 2015-07-03 DIAGNOSIS — Z9119 Patient's noncompliance with other medical treatment and regimen: Secondary | ICD-10-CM

## 2015-07-03 DIAGNOSIS — E1310 Other specified diabetes mellitus with ketoacidosis without coma: Principal | ICD-10-CM

## 2015-07-03 DIAGNOSIS — N179 Acute kidney failure, unspecified: Secondary | ICD-10-CM

## 2015-07-03 DIAGNOSIS — E876 Hypokalemia: Secondary | ICD-10-CM

## 2015-07-03 DIAGNOSIS — N189 Chronic kidney disease, unspecified: Secondary | ICD-10-CM

## 2015-07-03 LAB — CBC
HCT: 30 % — ABNORMAL LOW (ref 39.0–52.0)
Hemoglobin: 9.9 g/dL — ABNORMAL LOW (ref 13.0–17.0)
MCH: 29.6 pg (ref 26.0–34.0)
MCHC: 33 g/dL (ref 30.0–36.0)
MCV: 89.8 fL (ref 78.0–100.0)
Platelets: 140 10*3/uL — ABNORMAL LOW (ref 150–400)
RBC: 3.34 MIL/uL — ABNORMAL LOW (ref 4.22–5.81)
RDW: 13.3 % (ref 11.5–15.5)
WBC: 9.8 10*3/uL (ref 4.0–10.5)

## 2015-07-03 LAB — LACTIC ACID, PLASMA: Lactic Acid, Venous: 2.9 mmol/L (ref 0.5–2.0)

## 2015-07-03 LAB — BASIC METABOLIC PANEL
Anion gap: 25 — ABNORMAL HIGH (ref 5–15)
Anion gap: 10 (ref 5–15)
Anion gap: 11 (ref 5–15)
BUN: 55 mg/dL — ABNORMAL HIGH (ref 6–20)
BUN: 40 mg/dL — ABNORMAL HIGH (ref 6–20)
BUN: 35 mg/dL — ABNORMAL HIGH (ref 6–20)
CO2: 12 mmol/L — ABNORMAL LOW (ref 22–32)
CO2: 19 mmol/L — ABNORMAL LOW (ref 22–32)
CO2: 19 mmol/L — ABNORMAL LOW (ref 22–32)
Calcium: 8.6 mg/dL — ABNORMAL LOW (ref 8.9–10.3)
Calcium: 7.9 mg/dL — ABNORMAL LOW (ref 8.9–10.3)
Calcium: 7.8 mg/dL — ABNORMAL LOW (ref 8.9–10.3)
Chloride: 83 mmol/L — ABNORMAL LOW (ref 101–111)
Chloride: 118 mmol/L — ABNORMAL HIGH (ref 101–111)
Chloride: 113 mmol/L — ABNORMAL HIGH (ref 101–111)
Creatinine, Ser: 3.86 mg/dL — ABNORMAL HIGH (ref 0.61–1.24)
Creatinine, Ser: 2.55 mg/dL — ABNORMAL HIGH (ref 0.61–1.24)
Creatinine, Ser: 2.07 mg/dL — ABNORMAL HIGH (ref 0.61–1.24)
GFR calc Af Amer: 17 mL/min — ABNORMAL LOW (ref >60)
GFR calc Af Amer: 28 mL/min — ABNORMAL LOW (ref >60)
GFR calc Af Amer: 36 mL/min — ABNORMAL LOW (ref >60)
GFR calc non Af Amer: 14 mL/min — ABNORMAL LOW (ref >60)
GFR calc non Af Amer: 24 mL/min — ABNORMAL LOW (ref >60)
GFR calc non Af Amer: 31 mL/min — ABNORMAL LOW (ref >60)
Glucose, Bld: 1679 mg/dL (ref 65–99)
Glucose, Bld: 294 mg/dL — ABNORMAL HIGH (ref 65–99)
Glucose, Bld: 301 mg/dL — ABNORMAL HIGH (ref 65–99)
Potassium: >7.5 mmol/L (ref 3.5–5.1)
Potassium: 3.2 mmol/L — ABNORMAL LOW (ref 3.5–5.1)
Potassium: 4.1 mmol/L (ref 3.5–5.1)
Sodium: 120 mmol/L — ABNORMAL LOW (ref 135–145)
Sodium: 147 mmol/L — ABNORMAL HIGH (ref 135–145)
Sodium: 143 mmol/L (ref 135–145)

## 2015-07-03 LAB — GLUCOSE, CAPILLARY
Glucose-Capillary: 131 mg/dL — ABNORMAL HIGH (ref 65–99)
Glucose-Capillary: 154 mg/dL — ABNORMAL HIGH (ref 65–99)
Glucose-Capillary: 173 mg/dL — ABNORMAL HIGH (ref 65–99)
Glucose-Capillary: 197 mg/dL — ABNORMAL HIGH (ref 65–99)
Glucose-Capillary: 230 mg/dL — ABNORMAL HIGH (ref 65–99)
Glucose-Capillary: 268 mg/dL — ABNORMAL HIGH (ref 65–99)
Glucose-Capillary: 284 mg/dL — ABNORMAL HIGH (ref 65–99)
Glucose-Capillary: 288 mg/dL — ABNORMAL HIGH (ref 65–99)
Glucose-Capillary: 304 mg/dL — ABNORMAL HIGH (ref 65–99)
Glucose-Capillary: 431 mg/dL — ABNORMAL HIGH (ref 65–99)
Glucose-Capillary: 437 mg/dL — ABNORMAL HIGH (ref 65–99)
Glucose-Capillary: 483 mg/dL — ABNORMAL HIGH (ref 65–99)
Glucose-Capillary: 521 mg/dL — ABNORMAL HIGH (ref 65–99)
Glucose-Capillary: 59 mg/dL — ABNORMAL LOW (ref 65–99)
Glucose-Capillary: 92 mg/dL (ref 65–99)
Glucose-Capillary: >600 mg/dL (ref 65–99)
Glucose-Capillary: >600 mg/dL (ref 65–99)
Glucose-Capillary: >600 mg/dL (ref 65–99)

## 2015-07-03 LAB — PROCALCITONIN: Procalcitonin: 10.36 ng/mL

## 2015-07-03 LAB — BLOOD GAS, ARTERIAL
Acid-base deficit: 6.9 mmol/L — ABNORMAL HIGH (ref 0.0–2.0)
Bicarbonate: 18.6 mEq/L — ABNORMAL LOW (ref 20.0–24.0)
Drawn by: 308601
FIO2: 0.21
O2 Saturation: 96.7 %
Patient temperature: 97.9
TCO2: 17.7 mmol/L (ref 0–100)
pCO2 arterial: 39.1 mmHg (ref 35.0–45.0)
pH, Arterial: 7.297 — ABNORMAL LOW (ref 7.350–7.450)
pO2, Arterial: 88.3 mmHg (ref 80.0–100.0)

## 2015-07-03 LAB — MAGNESIUM: Magnesium: 2.3 mg/dL (ref 1.7–2.4)

## 2015-07-03 LAB — PHOSPHORUS: Phosphorus: 2.4 mg/dL — ABNORMAL LOW (ref 2.5–4.6)

## 2015-07-03 LAB — TROPONIN I: Troponin I: 0.05 ng/mL — ABNORMAL HIGH (ref <0.031)

## 2015-07-03 MED ORDER — POTASSIUM CHLORIDE IN NACL 20-0.45 MEQ/L-% IV SOLN
INTRAVENOUS | Status: DC
  Administered 2015-07-03: 10:00:00 via INTRAVENOUS
  Filled 2015-07-03 (×4): qty 1000

## 2015-07-03 MED ORDER — SODIUM CHLORIDE 0.45 % IV SOLN: INTRAVENOUS | Status: DC

## 2015-07-03 MED ORDER — INSULIN ASPART PROT & ASPART (70-30 MIX) 100 UNIT/ML ~~LOC~~ SUSP
15.0000 [IU] | Freq: Two times a day (BID) | SUBCUTANEOUS | Status: DC
  Administered 2015-07-03 – 2015-07-04 (×2): 15 [IU] via SUBCUTANEOUS
  Filled 2015-07-03 (×2): qty 10

## 2015-07-03 MED ORDER — INSULIN ASPART 100 UNIT/ML ~~LOC~~ SOLN
0.0000 [IU] | Freq: Three times a day (TID) | SUBCUTANEOUS | Status: DC
  Administered 2015-07-03 (×2): 5 [IU] via SUBCUTANEOUS
  Administered 2015-07-04 (×2): 3 [IU] via SUBCUTANEOUS
  Administered 2015-07-04: 7 [IU] via SUBCUTANEOUS

## 2015-07-03 NOTE — Progress Notes (Signed)
CRITICAL VALUE ALERT  Critical value received:  Lactic Acid 2.9  Date of notification:  07/03/2015  Time of notification:  0800  Critical value read back:Yes.    Nurse who received alert:  Jeannie Fend RN  MD notified (1st page):  Noe Gens NP  Time of first page:  0800  MD notified (2nd page):  Time of second page:  Responding MD:  Noe Gens NP  Time MD responded:  [phone]

## 2015-07-03 NOTE — Progress Notes (Addendum)
Dr. Chase Caller informed of morning follow-up EKG results. Stat troponin ordered. Will call back with results.

## 2015-07-03 NOTE — Progress Notes (Signed)
Pt had glucose of 59.  Admin 4oz of orange juice and rechecked glucose 15 minutes later and it was 131.  Informed Dr. Aurther Loft.  Irven Baltimore, RN

## 2015-07-03 NOTE — Progress Notes (Signed)
Called report to [place] on [place], RN

## 2015-07-03 NOTE — Progress Notes (Signed)
PULMONARY / CRITICAL CARE MEDICINE   Name: [name] [name] MRN: [id number] DOB: [birth year]    ADMISSION DATE:  07/02/2015 CONSULTATION DATE:  07/02/15  REFERRING MD :  ED  CHIEF COMPLAINT:  Confusion, Altered mental status, DKA.   INITIAL PRESENTATION: 69 year old with a history of insulin non-compliance was admitted on 9/19 from the [facility] ED with hyperosmolar state with positive urine ketones due to hyperglycemia. He notes running out of money and not taking his medications.  STUDIES:  CXR (07/02/15) No acute cardiopulmonary disease  SIGNIFICANT EVENTS: 9/19-Admitted with altered mental status, DKA.     SUBJECTIVE:  Feels better, wants to eat, denies chest pain or nausea  VITAL SIGNS: Temp:  [97.8 F (36.6 C)-100.1 F (37.8 C)] 97.8 F (36.6 C) (09/20 0400) Pulse Rate:  [56-99] 57 (09/20 0900) Resp:  [9-26] 20 (09/20 0906) BP: (103-171)/(41-74) 133/74 mmHg (09/20 0906) SpO2:  [95 %-100 %] 98 % (09/20 0900) FiO2 (%):  [2 %] 2 % (09/19 1951) Weight:  [147 lb 0.8 oz (66.7 kg)-148 lb 9.4 oz (67.4 kg)] 148 lb 9.4 oz (67.4 kg) (09/20 0500) HEMODYNAMICS:   VENTILATOR SETTINGS: 3 Lt O2 nasal cannula Vent Mode:  [-]  FiO2 (%):  [2 %] 2 % INTAKE / OUTPUT:  Intake/Output Summary (Last 24 hours) at 07/03/15 0935 Last data filed at 07/03/15 0830  Gross per 24 hour  Intake 5829.01 ml  Output   2450 ml  Net 3379.01 ml    PHYSICAL EXAMINATION: General:  Awake, alert, no distress HENT: NCAT EOMi PULM: CTA B CV: RRR, no mgr GI: BS+, soft, non-tender  MSK: normal bulk and tone Neuro: awake, alert, maew  LABS:  CBC  Recent Labs Lab 07/02/15 1440 07/02/15 1840 07/03/15 0355  WBC 10.3 8.0 9.8  HGB 10.3* 9.2* 9.9*  HCT 39.2 30.8* 30.0*  PLT 170 148* 140*   Coag's No results for input(s): APTT, INR in the last 168 hours. BMET  Recent Labs Lab 07/02/15 1440 07/02/15 1840 07/03/15 0355  NA 120* 132* 147*  K >7.5* 4.4 3.2*  CL 83* 100* 118*  CO2 12* 12* 19*  BUN 55*  53* 40*  CREATININE 3.86* 3.57* 2.55*  GLUCOSE 1679* 1241* 294*   Electrolytes  Recent Labs Lab 07/02/15 1440 07/02/15 1840 07/03/15 0355  CALCIUM 8.6* 7.8* 7.9*  MG  --   --  2.3  PHOS  --   --  2.4*   Sepsis Markers  Recent Labs Lab 07/02/15 1604 07/02/15 1838 07/02/15 1840 07/03/15 0355 07/03/15 0656  LATICACIDVEN 3.42* 3.25*  --   --  2.9*  PROCALCITON  --   --  6.87 10.36  --    ABG  Recent Labs Lab 07/02/15 1644 07/03/15 0517  PHART 7.167* 7.297*  PCO2ART 27.3* 39.1  PO2ART 179* 88.3   Liver Enzymes  Recent Labs Lab 07/02/15 1840  AST 26  ALT 18  ALKPHOS 70  BILITOT 1.1  ALBUMIN 3.4*   Cardiac Enzymes  Recent Labs Lab 07/02/15 1840 07/03/15 0427  TROPONINI 0.03 0.05*   Glucose  Recent Labs Lab 07/03/15 0329 07/03/15 0431 07/03/15 0522 07/03/15 0614 07/03/15 0723 07/03/15 0932  GLUCAP 304* 288* 197* 173* 154* 59*    Imaging 9/19 CT head > atrophy of deep cerebral white matter, no acute intracranial abnormalities, small chronic air-fluid level in the left maxillary sinus 9/19 US renal > no hydro 9/19 CXR > cephalization? No edema or effusion, normal cardiac silhouette  ASSESSMENT / PLAN:  PULMONARY OETT A:  No acute issues P:   Monitor O2 saturation with vitals  CARDIOVASCULAR CVL A: Hypertension at baseline P:  Restart ramipril at discharge assuming renal function improved  RENAL A: AKI on CKD due to volume depletion from DKA> rapidly improving Hypokalemia P:   Continue IVF with 20mEq KCL Monitor BMET and UOP (repeat BMET later today) Replace electrolytes as needed  GASTROINTESTINAL A:  No acute issues P:   Carb modified diet  INFECTIOUS A:  No evidence of infection P:   BCx2 07/02/15 U/A without  Monitor for fever D/c procalcitonin protocol  ENDOCRINE A: Uncontrolled DM due to medication non-compliance  Hyperosmolar hyperglycemic state with ketosis due to not taking medications P:   D/C Insulin gtt  and DKA protocol Start diet Restart home meds (70/30) + SSI Social worker consult to help pay for medications  NEUROLOGIC A:  Confusion likely secondary to metabolic derangements > resolved Recent thalamic stroke P:   No sedating medications  FAMILY  - Updates: Sister updated at bedside by Dr. Vaughan Browner on 9/19, non available on 9/19  - Inter-disciplinary family meet or Palliative Care meeting due by:  07/09/15  TODAY'S SUMMARY:  Gap closed; stop insulin gtt, move to floor  Roselie Awkward, MD [place] [facility] Pager: [phone] Cell: [phone] After 3pm or if no response, call [phone]   07/03/2015, 9:35 AM

## 2015-07-03 NOTE — Progress Notes (Signed)
CSW consulted to assist with home meds. CSW is unable to assist with this request. CSW will pass referral on to [facility].  Werner Lean LCSW [phone]

## 2015-07-03 NOTE — Clinical Documentation Improvement (Signed)
Critical Care Pulmonology  Can the diagnosis of altered mental status be further specified?  Encephalopathy - metabolic, other specified  Other Condition  Clinically Undetermined  Document any associated diagnoses/conditions.  Supporting Information:  Confusion likely secondary to metabolic derangements > resolved  Recent thalamic stroke   DKA with blood sugars running as high as 1679, 1241  Please exercise your independent, professional judgment when responding. A specific answer is not anticipated or expected.  Thank You,  Zoila Shutter BSN, [place] [phone]

## 2015-07-03 NOTE — Progress Notes (Signed)
Initial Nutrition Assessment  DOCUMENTATION CODES:   Not applicable  INTERVENTION:  - Will monitor for needs at follow-up, including need for supplements - Will monitor ability to chew foods related to missing teeth  NUTRITION DIAGNOSIS:   Altered nutrition lab value related to acute illness as evidenced by other (see comment) (CBGs: 154-521 mg/dL).  GOAL:   Patient will meet greater than or equal to 90% of their needs  MONITOR:   PO intake, Weight trends, Labs, I & O's  REASON FOR ASSESSMENT:   Malnutrition Screening Tool  ASSESSMENT:   69 year old with uncontrolled diabetes who lives with his sister. [name] out with a couple of girls last week. After he came back the patient had been confused and disoriented and had not been taking his insulin regularly. In the ED was found to have potassium of 7.5 with peaked T waves, glucose of ~1700 with AKI, anion gap and lactic acidosis. So far he has received 3 L normal saline, started on an insulin drip. Got albuterol, calcium gluconate and sodium bicarbonate.  Pt seen for MST. BMI indicates normal weight status. Pt with confusion at time of visit and no family/visitors present.   Diet was advanced shortly before RD visit from NPO to Carb Modified; ordered breakfast for pt: he wanted toast with jelly, applesauce, hot tea with sweetener. He is missing several teeth but states he has partials although he is not sure where they are at this time.  He denies abdominal pain or nausea this AM. Unable to determine baseline PO intakes. He is unsure of weight trends. Chart review indicates 4 lb weight loss (3% body weight) in the past 1 month which is not significant for time frame. No muscle or fat wasting noted.  Will follow-up to determine PO intakes and chewing ability with current diet texture. Not meeting needs with recent diet advancement. Medications reviewed. Labs reviewed; CBGs: 154-521 mg/dL, Na: 147 mmol/L, K: 3.2 mmol/L, Cl: 118 mmol/L,  BUN/creatinine elevated, Ca: 7.9 mg/dL, Phos: 2.4 mmol/L, GFR: 28.   Diet Order:  Diet Carb Modified Fluid consistency:: Thin; Room service appropriate?: Yes  Skin:  Reviewed, no issues  Last BM:  9/18  Height:   Ht Readings from Last 1 Encounters:  07/02/15 5\' 9"  (1.753 m)    Weight:   Wt Readings from Last 1 Encounters:  07/03/15 148 lb 9.4 oz (67.4 kg)    Ideal Body Weight:  72.73 kg (kg)  BMI:  Body mass index is 21.93 kg/(m^2).  Estimated Nutritional Needs:   Kcal:  1400-1600  Protein:  60-70 grams  Fluid:  2 L/day  EDUCATION NEEDS:   No education needs identified at this time     Jarome Matin, RD, LDN Inpatient Clinical Dietitian Pager # [phone] After hours/weekend pager # [phone]

## 2015-07-03 NOTE — Care Management Note (Addendum)
Case Management Note  Patient Details  Name: [name] [name] MRN: [id number] Date of Birth: 11-28-1945  Subjective/Objective:            Hyperglycemia, k-7.5, ams, elevated tropoin        Action/Plan: Date:  Sept. 20, 2016 U.R. performed for needs and level of care. Will continue to follow for Case Management needs.  [name], RN, BSN, [place]   [phone]  Expected Discharge Date:   (unknown)               Expected Discharge Plan:  Home/Self Care  In-House Referral:  NA  Discharge planning Services  CM Consult  Post Acute Care Choice:  NA Choice offered to:  NA  DME Arranged:    DME Agency:     HH Arranged:    HH Agency:     Status of Service:  In process, will continue to follow  Medicare Important Message Given:    Date Medicare IM Given:    Medicare IM give by:    Date Additional Medicare IM Given:    Additional Medicare Important Message give by:     If discussed at [place] of Stay Meetings, dates discussed:    Additional Comments:  Leeroy Cha, RN 07/03/2015, 10:23 AM

## 2015-07-03 NOTE — Progress Notes (Signed)
eLink Physician-Brief Progress Note Patient Name: Naszir Rigano DOB: [birth year] MRN: [id number]   Date of Service  07/03/2015  HPI/Events of Note  ekg shows flatening of t waves in lateral leads. QT is < 51msec.     Recent Labs Lab 07/02/15 1840  TROPONINI 0.03    Recent Labs Lab 07/02/15 1440 07/02/15 1840 07/03/15 0355  NA 120* 132* 147*  K >7.5* 4.4 3.2*  CL 83* 100* 118*  CO2 12* 12* 19*  GLUCOSE 1679* 1241* 294*  BUN 55* 53* 40*  CREATININE 3.86* 3.57* 2.55*  CALCIUM 8.6* 7.8* 7.9*  MG  --   --  2.3  PHOS  --   --  2.4*     eICU Interventions  Check trop stat     Intervention Category Intermediate Interventions: Diagnostic test evaluation  RAMASWAMY,MURALI 07/03/2015, 5:30 AM

## 2015-07-04 LAB — GLUCOSE, CAPILLARY
Glucose-Capillary: 328 mg/dL — ABNORMAL HIGH (ref 65–99)
Glucose-Capillary: 238 mg/dL — ABNORMAL HIGH (ref 65–99)
Glucose-Capillary: 206 mg/dL — ABNORMAL HIGH (ref 65–99)

## 2015-07-04 LAB — BASIC METABOLIC PANEL
Anion gap: 6 (ref 5–15)
BUN: 24 mg/dL — ABNORMAL HIGH (ref 6–20)
CO2: 21 mmol/L — ABNORMAL LOW (ref 22–32)
Calcium: 7.8 mg/dL — ABNORMAL LOW (ref 8.9–10.3)
Chloride: 114 mmol/L — ABNORMAL HIGH (ref 101–111)
Creatinine, Ser: 1.63 mg/dL — ABNORMAL HIGH (ref 0.61–1.24)
GFR calc Af Amer: 48 mL/min — ABNORMAL LOW (ref >60)
GFR calc non Af Amer: 41 mL/min — ABNORMAL LOW (ref >60)
Glucose, Bld: 169 mg/dL — ABNORMAL HIGH (ref 65–99)
Potassium: 3.8 mmol/L (ref 3.5–5.1)
Sodium: 141 mmol/L (ref 135–145)

## 2015-07-04 LAB — URINE CULTURE: Special Requests: NORMAL

## 2015-07-04 MED ORDER — BLOOD GLUCOSE METER KIT: PACK | Status: DC

## 2015-07-04 MED ORDER — INSULIN ASPART PROT & ASPART (70-30 MIX) 100 UNIT/ML ~~LOC~~ SUSP: 18.0000 [IU] | Freq: Two times a day (BID) | SUBCUTANEOUS | Status: DC

## 2015-07-04 MED ORDER — TRAZODONE HCL 50 MG PO TABS
100.0000 mg | ORAL_TABLET | Freq: Every evening | ORAL | Status: DC | PRN
  Administered 2015-07-04: 100 mg via ORAL
  Filled 2015-07-04: qty 2

## 2015-07-04 MED ORDER — AMLODIPINE BESYLATE 5 MG PO TABS: 5.0000 mg | ORAL_TABLET | Freq: Every day | ORAL | Status: DC

## 2015-07-04 NOTE — Care Management Note (Signed)
Case Management Note  Patient Details  Name: [name] [name] MRN: [id number] Date of Birth: May 15, 1946  Subjective/Objective:     69 yo admitted with DKA               Action/Plan: From home with sister  Expected Discharge Date:   (unknown)               Expected Discharge Plan:  [place]  In-House Referral:  NA  Discharge planning Services  CM Consult  Post Acute Care Choice:  NA, Home Health Choice offered to:  Patient  DME Arranged:    DME Agency:     HH Arranged:  RN [place] Agency:  [facility]  Status of Service:  In process, will continue to follow  Medicare Important Message Given:  Yes-second notification given Date Medicare IM Given:    Medicare IM give by:    Date Additional Medicare IM Given:    Additional Medicare Important Message give by:     If discussed at [place] of Stay Meetings, dates discussed:    Additional Comments:  CM consult for [facility] and financial help with affording insulin. This CM spoke with pt at bedside for disposition planning. Pt states that he has no problems affording his insulin with his [employer] and Medicaid. Pt offered choice for [facility] and chose [facility]. [facility] rep called to give referral. No other DC needs noted at this time. Lynnell Catalan, RN 07/04/2015, 12:42 PM

## 2015-07-04 NOTE — Progress Notes (Signed)
[facility] Progress Note Patient Name: Equan Eisenzimmer DOB: November 01, 1945 MRN: [id number]   Date of Service  07/04/2015  HPI/Events of Note    eICU Interventions  PRN trazodone for sleep     Intervention Category Minor Interventions: Routine modifications to care plan (e.g. PRN medications for pain, fever)  Merton Border 07/04/2015, 12:04 AM

## 2015-07-04 NOTE — Discharge Summary (Signed)
Physician Discharge Summary  Patient ID: [name] [name] MRN: [id number] DOB/AGE: 14-Jul-1946 69 y.o.  Admit date: 07/02/2015 Discharge date: 07/04/2015    Discharge Diagnoses:  Hyperosmolar Hyperglycemic State  Uncontrolled Diabetes Mellitus  Hypertension  Acute on Chronic Kidney Disease  Hypokalemia  Confusion / Acute Encephalopathy  Hx of Thalamic CVA Chronic Pain / Anxiety                                                                       DISCHARGE PLAN BY DIAGNOSIS     Hyperosmolar Hyperglycemic State - resolved  Uncontrolled Diabetes Mellitus   Discharge Plan: Resume Insulin aspart 70/30 mix, increase to 18 units BID Meter kit for discharge  Arranged for home health RN to assist with DM compliance / assist with medication changes   Hypertension   Discharge Plan: Hold Ramipril in the setting of resolving AKI  Norvasc 40m QD until seen by PCP and serum creatinine reviewed.    Acute on Chronic Kidney Disease - improved at discharge, sr cr down to 1.62 Hypokalemia - resolved  Discharge Plan: Follow up BMP in office with PCP   Confusion / Acute Encephalopathy - resolved  Hx of Thalamic CVA  Discharge Plan: Supportive care                   DISCHARGE SUMMARY   [name] [name] a 69y.o. y/o male with a PMH of uncontrolled diabetes mellitus with frequent admissions for hyperglycemia [facility].    At baseline, the patient lives with his sister.  On admit, he reportedly had gone out with a couple of girls last week and hadn't been home.  Upon return, he was noted to be confused / disoriented and apparently had not been taking his insulin regularly.  ER evaluation was notable for hyperkalemia (K 7.5) with peaked T waves, glucose greater than 1600 with acute kidney injury and lactic acidosis.  He was treated with volume resuscitation and started on an insulin gtt.  For hyperkalemia, he received albuterol,  calcium gluconate & sodium bicarbonate.  He was admitted to ICU/SDU.  Home anti-hypertensive regimen was held in the setting of AKI (intial sr cr up to 3.86).  He was hemodynamically stable throughout admission.  Initial confusion resolved with correction of hyperglycemia and hydration.  The patient was pan cultured and negative to date.  Renal ultrasound was assessed and negative for mass or hydronephrosis.  Serum creatinine improved to 1.63 on day of discharge.  Given the patients non-compliance, he was set up with a home health RN to assess understanding of insulin administration and to assist with medication changes.  He was medically cleared 9/21 for discharge.              SIGNIFICANT DIAGNOSTIC STUDIES 9/19  CT Head >> atrophy w/ small vessel chronic ischemic changes, no acute abnormalities, small air fluid level in L maxillary sinus  MICRO DATA  BCx2 9/19 >>  UC 9/19 >> multiple species   Discharge Exam: General: Awake, alert, no distress HENT: NCAT EOMi PULM: CTA B CV: RRR, no mgr GI: BS+, soft, non-tender  MSK: normal bulk and tone Neuro: awake, alert, maew  Filed Vitals:  07/03/15 1200 07/03/15 1503 07/03/15 2037 07/04/15 0452  BP: 129/59 177/78 147/81 167/75  Pulse: 65 58 66 65  Temp:  98.4 F (36.9 C) 98.8 F (37.1 C) 98.1 F (36.7 C)  TempSrc:  Oral Oral Oral  Resp: '14 16 14 16  ' Height:      Weight:    149 lb 1.6 oz (67.631 kg)  SpO2: 100% 100% 98% 97%     Discharge Labs  BMET  Recent Labs Lab 07/02/15 1440 07/02/15 1840 07/03/15 0355 07/03/15 1240 07/04/15 0407  NA 120* 132* 147* 143 141  K >7.5* 4.4 3.2* 4.1 3.8  CL 83* 100* 118* 113* 114*  CO2 12* 12* 19* 19* 21*  GLUCOSE 1679* 1241* 294* 301* 169*  BUN 55* 53* 40* 35* 24*  CREATININE 3.86* 3.57* 2.55* 2.07* 1.63*  CALCIUM 8.6* 7.8* 7.9* 7.8* 7.8*  MG  --   --  2.3  --   --   PHOS  --   --  2.4*  --   --    CBC  Recent Labs Lab 07/02/15 1440 07/02/15 1840 07/03/15 0355  HGB 10.3*  9.2* 9.9*  HCT 39.2 30.8* 30.0*  WBC 10.3 8.0 9.8  PLT 170 148* 140*        Follow-up Information    Follow up with [name] Po, [name] On 07/09/2015.   Specialty:  Family Medicine   Why:  Appt at 11:00 AM   Contact information:   [place] [id number] [phone]       Follow up with [name] [name].   Contact information:   [place] [id number] [phone]          Medication List    STOP taking these medications        ramipril 10 MG capsule  Commonly known as:  ALTACE      TAKE these medications        ALPRAZolam 1 MG tablet  Commonly known as:  XANAX  Take 1 tablet (1 mg total) by mouth 2 (two) times daily as needed for anxiety.     amLODipine 5 MG tablet  Commonly known as:  NORVASC  Take 1 tablet (5 mg total) by mouth daily.     aspirin EC 325 MG tablet  Take 1 tablet (325 mg total) by mouth daily.     atorvastatin 10 MG tablet  Commonly known as:  LIPITOR  Take 1 tablet (10 mg total) by mouth daily at 6 PM.     blood glucose meter kit and supplies  Dispense based on patient and insurance preference. Use up to four times daily as directed. (FOR ICD-9 250.00, 250.01).     FLUoxetine 20 MG capsule  Commonly known as:  PROZAC  Take 20 mg by mouth daily.     folic acid 1 MG tablet  Commonly known as:  FOLVITE  Take 1 mg by mouth daily.     insulin aspart protamine- aspart (70-30) 100 UNIT/ML injection  Commonly known as:  NOVOLOG MIX 70/30  Inject 0.18 mLs (18 Units total) into the skin 2 (two) times daily.     Oxycodone HCl 20 MG Tabs  Take 20 mg by mouth 4 (four) times daily.     trimethoprim-polymyxin b ophthalmic solution  Commonly known as:  POLYTRIM          Disposition: Home.  No new home health needs identified.    Discharged Condition: [name] [name] has  met maximum benefit of inpatient care and is medically stable and cleared for discharge.  Patient is pending follow up as  above.      Time spent on disposition:  Greater than 35 minutes.   Signed: Noe Gens, NP-C [facility] Pgr: [phone] Office: [phone]

## 2015-07-04 NOTE — Care Management Important Message (Signed)
Important Message  Patient Details  Name: [name] [name] MRN: [id number] Date of Birth: [birth year]   Medicare Important Message Given:  Yes-second notification given    Camillo Flaming 07/04/2015, 11:11 AMImportant Message  Patient Details  Name: [name] [name] MRN: [id number] Date of Birth: [birth year]   Medicare Important Message Given:  Yes-second notification given    Camillo Flaming 07/04/2015, 11:11 AM

## 2015-07-05 ENCOUNTER — Inpatient Hospital Stay (HOSPITAL_COMMUNITY)
Admission: EM | Admit: 2015-07-05 | Discharge: 2015-07-09 | DRG: 638 | Disposition: A | Discharge status: Home / Self Care | Payer: Medicare HMO | Attending: Internal Medicine | Admitting: Internal Medicine

## 2015-07-05 ENCOUNTER — Emergency Department (HOSPITAL_COMMUNITY): Payer: Medicare HMO

## 2015-07-05 ENCOUNTER — Encounter (HOSPITAL_COMMUNITY): Payer: Self-pay

## 2015-07-05 ENCOUNTER — Telehealth: Payer: Self-pay | Admitting: *Deleted

## 2015-07-05 DIAGNOSIS — R627 Adult failure to thrive: Secondary | ICD-10-CM | POA: Diagnosis present

## 2015-07-05 DIAGNOSIS — E1010 Type 1 diabetes mellitus with ketoacidosis without coma: Secondary | ICD-10-CM | POA: Diagnosis not present

## 2015-07-05 DIAGNOSIS — E876 Hypokalemia: Secondary | ICD-10-CM | POA: Diagnosis present

## 2015-07-05 DIAGNOSIS — R4781 Slurred speech: Secondary | ICD-10-CM | POA: Diagnosis present

## 2015-07-05 DIAGNOSIS — R471 Dysarthria and anarthria: Secondary | ICD-10-CM

## 2015-07-05 DIAGNOSIS — Z8673 Personal history of transient ischemic attack (TIA), and cerebral infarction without residual deficits: Secondary | ICD-10-CM | POA: Diagnosis not present

## 2015-07-05 DIAGNOSIS — Z9119 Patient's noncompliance with other medical treatment and regimen: Secondary | ICD-10-CM | POA: Diagnosis present

## 2015-07-05 DIAGNOSIS — Z794 Long term (current) use of insulin: Secondary | ICD-10-CM

## 2015-07-05 DIAGNOSIS — N179 Acute kidney failure, unspecified: Secondary | ICD-10-CM | POA: Diagnosis present

## 2015-07-05 DIAGNOSIS — Z9114 Patient's other noncompliance with medication regimen: Secondary | ICD-10-CM | POA: Diagnosis present

## 2015-07-05 DIAGNOSIS — I4581 Long QT syndrome: Secondary | ICD-10-CM | POA: Diagnosis present

## 2015-07-05 DIAGNOSIS — R739 Hyperglycemia, unspecified: Secondary | ICD-10-CM | POA: Diagnosis present

## 2015-07-05 DIAGNOSIS — I129 Hypertensive chronic kidney disease with stage 1 through stage 4 chronic kidney disease, or unspecified chronic kidney disease: Secondary | ICD-10-CM | POA: Diagnosis present

## 2015-07-05 DIAGNOSIS — E119 Type 2 diabetes mellitus without complications: Secondary | ICD-10-CM | POA: Diagnosis not present

## 2015-07-05 DIAGNOSIS — E875 Hyperkalemia: Secondary | ICD-10-CM | POA: Diagnosis present

## 2015-07-05 DIAGNOSIS — N183 Chronic kidney disease, stage 3 (moderate): Secondary | ICD-10-CM | POA: Diagnosis present

## 2015-07-05 DIAGNOSIS — E1310 Other specified diabetes mellitus with ketoacidosis without coma: Secondary | ICD-10-CM | POA: Diagnosis present

## 2015-07-05 DIAGNOSIS — I10 Essential (primary) hypertension: Secondary | ICD-10-CM | POA: Diagnosis not present

## 2015-07-05 DIAGNOSIS — F17210 Nicotine dependence, cigarettes, uncomplicated: Secondary | ICD-10-CM | POA: Diagnosis present

## 2015-07-05 DIAGNOSIS — E0810 Diabetes mellitus due to underlying condition with ketoacidosis without coma: Secondary | ICD-10-CM | POA: Diagnosis not present

## 2015-07-05 LAB — ETHANOL: Alcohol, Ethyl (B): <5 mg/dL (ref <5)

## 2015-07-05 LAB — GLUCOSE, CAPILLARY
Glucose-Capillary: 555 mg/dL (ref 65–99)
Glucose-Capillary: 498 mg/dL — ABNORMAL HIGH (ref 65–99)
Glucose-Capillary: 494 mg/dL — ABNORMAL HIGH (ref 65–99)
Glucose-Capillary: 410 mg/dL — ABNORMAL HIGH (ref 65–99)
Glucose-Capillary: 249 mg/dL — ABNORMAL HIGH (ref 65–99)
Glucose-Capillary: 174 mg/dL — ABNORMAL HIGH (ref 65–99)
Glucose-Capillary: 160 mg/dL — ABNORMAL HIGH (ref 65–99)
Glucose-Capillary: 156 mg/dL — ABNORMAL HIGH (ref 65–99)
Glucose-Capillary: 122 mg/dL — ABNORMAL HIGH (ref 65–99)

## 2015-07-05 LAB — COMPREHENSIVE METABOLIC PANEL
ALT: 26 U/L (ref 17–63)
AST: 40 U/L (ref 15–41)
Albumin: 3.5 g/dL (ref 3.5–5.0)
Alkaline Phosphatase: 86 U/L (ref 38–126)
BUN: 32 mg/dL — ABNORMAL HIGH (ref 6–20)
CO2: <5 mmol/L — ABNORMAL LOW (ref 22–32)
Calcium: 8.6 mg/dL — ABNORMAL LOW (ref 8.9–10.3)
Chloride: 97 mmol/L — ABNORMAL LOW (ref 101–111)
Creatinine, Ser: 2.71 mg/dL — ABNORMAL HIGH (ref 0.61–1.24)
GFR calc Af Amer: 26 mL/min — ABNORMAL LOW (ref >60)
GFR calc non Af Amer: 22 mL/min — ABNORMAL LOW (ref >60)
Glucose, Bld: 863 mg/dL (ref 65–99)
Potassium: 5.4 mmol/L — ABNORMAL HIGH (ref 3.5–5.1)
Sodium: 131 mmol/L — ABNORMAL LOW (ref 135–145)
Total Bilirubin: 1.8 mg/dL — ABNORMAL HIGH (ref 0.3–1.2)
Total Protein: 6.8 g/dL (ref 6.5–8.1)

## 2015-07-05 LAB — I-STAT CHEM 8, ED
BUN: 33 mg/dL — ABNORMAL HIGH (ref 6–20)
Calcium, Ion: 1.09 mmol/L — ABNORMAL LOW (ref 1.13–1.30)
Chloride: 104 mmol/L (ref 101–111)
Creatinine, Ser: 2 mg/dL — ABNORMAL HIGH (ref 0.61–1.24)
Glucose, Bld: >700 mg/dL (ref 65–99)
HCT: 40 % (ref 39.0–52.0)
Hemoglobin: 13.6 g/dL (ref 13.0–17.0)
Potassium: 5.4 mmol/L — ABNORMAL HIGH (ref 3.5–5.1)
Sodium: 132 mmol/L — ABNORMAL LOW (ref 135–145)
TCO2: 6 mmol/L (ref 0–100)

## 2015-07-05 LAB — URINALYSIS, ROUTINE W REFLEX MICROSCOPIC
Bilirubin Urine: NEGATIVE
Glucose, UA: >1000 mg/dL — AB
Ketones, ur: >80 mg/dL — AB
Leukocytes, UA: NEGATIVE
Nitrite: NEGATIVE
Protein, ur: 30 mg/dL — AB
Specific Gravity, Urine: 1.02 (ref 1.005–1.030)
Urobilinogen, UA: 0.2 mg/dL (ref 0.0–1.0)
pH: 5 (ref 5.0–8.0)

## 2015-07-05 LAB — CBC WITH DIFFERENTIAL/PLATELET
Basophils Absolute: 0 10*3/uL (ref 0.0–0.1)
Basophils Relative: 0 %
Eosinophils Absolute: 0 10*3/uL (ref 0.0–0.7)
Eosinophils Relative: 0 %
HCT: 37.6 % — ABNORMAL LOW (ref 39.0–52.0)
Hemoglobin: 11.7 g/dL — ABNORMAL LOW (ref 13.0–17.0)
Lymphocytes Relative: 7 %
Lymphs Abs: 1.3 10*3/uL (ref 0.7–4.0)
MCH: 30.6 pg (ref 26.0–34.0)
MCHC: 31.1 g/dL (ref 30.0–36.0)
MCV: 98.4 fL (ref 78.0–100.0)
Monocytes Absolute: 1.5 10*3/uL — ABNORMAL HIGH (ref 0.1–1.0)
Monocytes Relative: 8 %
Neutro Abs: 15.8 10*3/uL — ABNORMAL HIGH (ref 1.7–7.7)
Neutrophils Relative %: 85 %
Platelets: 171 10*3/uL (ref 150–400)
RBC: 3.82 MIL/uL — ABNORMAL LOW (ref 4.22–5.81)
RDW: 14.4 % (ref 11.5–15.5)
WBC: 18.6 10*3/uL — ABNORMAL HIGH (ref 4.0–10.5)

## 2015-07-05 LAB — BASIC METABOLIC PANEL
Anion gap: 24 — ABNORMAL HIGH (ref 5–15)
Anion gap: 16 — ABNORMAL HIGH (ref 5–15)
Anion gap: 10 (ref 5–15)
Anion gap: 6 (ref 5–15)
BUN: 34 mg/dL — ABNORMAL HIGH (ref 6–20)
BUN: 32 mg/dL — ABNORMAL HIGH (ref 6–20)
BUN: 32 mg/dL — ABNORMAL HIGH (ref 6–20)
BUN: 30 mg/dL — ABNORMAL HIGH (ref 6–20)
CO2: 8 mmol/L — ABNORMAL LOW (ref 22–32)
CO2: 14 mmol/L — ABNORMAL LOW (ref 22–32)
CO2: 16 mmol/L — ABNORMAL LOW (ref 22–32)
CO2: 22 mmol/L (ref 22–32)
Calcium: 8.5 mg/dL — ABNORMAL LOW (ref 8.9–10.3)
Calcium: 8.4 mg/dL — ABNORMAL LOW (ref 8.9–10.3)
Calcium: 8.2 mg/dL — ABNORMAL LOW (ref 8.9–10.3)
Calcium: 8.3 mg/dL — ABNORMAL LOW (ref 8.9–10.3)
Chloride: 105 mmol/L (ref 101–111)
Chloride: 111 mmol/L (ref 101–111)
Chloride: 117 mmol/L — ABNORMAL HIGH (ref 101–111)
Chloride: 114 mmol/L — ABNORMAL HIGH (ref 101–111)
Creatinine, Ser: 2.65 mg/dL — ABNORMAL HIGH (ref 0.61–1.24)
Creatinine, Ser: 2.53 mg/dL — ABNORMAL HIGH (ref 0.61–1.24)
Creatinine, Ser: 2.11 mg/dL — ABNORMAL HIGH (ref 0.61–1.24)
Creatinine, Ser: 2.02 mg/dL — ABNORMAL HIGH (ref 0.61–1.24)
GFR calc Af Amer: 26 mL/min — ABNORMAL LOW (ref >60)
GFR calc Af Amer: 28 mL/min — ABNORMAL LOW (ref >60)
GFR calc Af Amer: 35 mL/min — ABNORMAL LOW (ref >60)
GFR calc Af Amer: 37 mL/min — ABNORMAL LOW (ref >60)
GFR calc non Af Amer: 23 mL/min — ABNORMAL LOW (ref >60)
GFR calc non Af Amer: 24 mL/min — ABNORMAL LOW (ref >60)
GFR calc non Af Amer: 30 mL/min — ABNORMAL LOW (ref >60)
GFR calc non Af Amer: 32 mL/min — ABNORMAL LOW (ref >60)
Glucose, Bld: 642 mg/dL (ref 65–99)
Glucose, Bld: 406 mg/dL — ABNORMAL HIGH (ref 65–99)
Glucose, Bld: 95 mg/dL (ref 65–99)
Glucose, Bld: 119 mg/dL — ABNORMAL HIGH (ref 65–99)
Potassium: 5.3 mmol/L — ABNORMAL HIGH (ref 3.5–5.1)
Potassium: 4.2 mmol/L (ref 3.5–5.1)
Potassium: 5.6 mmol/L — ABNORMAL HIGH (ref 3.5–5.1)
Potassium: 4.1 mmol/L (ref 3.5–5.1)
Sodium: 137 mmol/L (ref 135–145)
Sodium: 141 mmol/L (ref 135–145)
Sodium: 143 mmol/L (ref 135–145)
Sodium: 142 mmol/L (ref 135–145)

## 2015-07-05 LAB — CBC
HCT: 30.8 % — ABNORMAL LOW (ref 39.0–52.0)
Hemoglobin: 10.2 g/dL — ABNORMAL LOW (ref 13.0–17.0)
MCH: 30.5 pg (ref 26.0–34.0)
MCHC: 33.1 g/dL (ref 30.0–36.0)
MCV: 92.2 fL (ref 78.0–100.0)
Platelets: 160 10*3/uL (ref 150–400)
RBC: 3.34 MIL/uL — ABNORMAL LOW (ref 4.22–5.81)
RDW: 14 % (ref 11.5–15.5)
WBC: 15.2 10*3/uL — ABNORMAL HIGH (ref 4.0–10.5)

## 2015-07-05 LAB — MAGNESIUM: Magnesium: 2.1 mg/dL (ref 1.7–2.4)

## 2015-07-05 LAB — URINE MICROSCOPIC-ADD ON

## 2015-07-05 LAB — CBG MONITORING, ED
Glucose-Capillary: >600 mg/dL (ref 65–99)
Glucose-Capillary: >600 mg/dL (ref 65–99)

## 2015-07-05 LAB — I-STAT CG4 LACTIC ACID, ED: Lactic Acid, Venous: 5.3 mmol/L (ref 0.5–2.0)

## 2015-07-05 LAB — MRSA PCR SCREENING: MRSA by PCR: NEGATIVE

## 2015-07-05 LAB — LACTIC ACID, PLASMA: Lactic Acid, Venous: 3.9 mmol/L (ref 0.5–2.0)

## 2015-07-05 LAB — PHOSPHORUS: Phosphorus: 4.4 mg/dL (ref 2.5–4.6)

## 2015-07-05 MED ORDER — SODIUM CHLORIDE 0.9 % IV SOLN: INTRAVENOUS | Status: DC

## 2015-07-05 MED ORDER — INSULIN GLARGINE 100 UNIT/ML ~~LOC~~ SOLN
10.0000 [IU] | SUBCUTANEOUS | Status: DC
  Administered 2015-07-05: 10 [IU] via SUBCUTANEOUS
  Filled 2015-07-05 (×2): qty 0.1

## 2015-07-05 MED ORDER — ONDANSETRON HCL 4 MG/2ML IJ SOLN: 4.0000 mg | Freq: Four times a day (QID) | INTRAMUSCULAR | Status: DC | PRN

## 2015-07-05 MED ORDER — SODIUM CHLORIDE 0.9 % IV SOLN: 1000.0000 mL | INTRAVENOUS | Status: DC

## 2015-07-05 MED ORDER — INSULIN ASPART 100 UNIT/ML ~~LOC~~ SOLN
10.0000 [IU] | Freq: Once | SUBCUTANEOUS | Status: AC
  Administered 2015-07-05: 10 [IU] via INTRAVENOUS
  Filled 2015-07-05: qty 1

## 2015-07-05 MED ORDER — INSULIN REGULAR HUMAN 100 UNIT/ML IJ SOLN
INTRAMUSCULAR | Status: DC
  Administered 2015-07-05: 5.4 [IU]/h via INTRAVENOUS
  Filled 2015-07-05: qty 2.5

## 2015-07-05 MED ORDER — SODIUM CHLORIDE 0.9 % IV SOLN
1000.0000 mL | Freq: Once | INTRAVENOUS | Status: AC
  Administered 2015-07-05: 1000 mL via INTRAVENOUS

## 2015-07-05 MED ORDER — INSULIN ASPART 100 UNIT/ML ~~LOC~~ SOLN
0.0000 [IU] | SUBCUTANEOUS | Status: DC
  Administered 2015-07-06 (×2): 2 [IU] via SUBCUTANEOUS

## 2015-07-05 MED ORDER — DEXTROSE-NACL 5-0.45 % IV SOLN
INTRAVENOUS | Status: DC
  Administered 2015-07-05: 17:00:00 via INTRAVENOUS

## 2015-07-05 MED ORDER — SODIUM CHLORIDE 0.9 % IV SOLN
INTRAVENOUS | Status: DC
  Administered 2015-07-05: 125 mL/h via INTRAVENOUS

## 2015-07-05 MED ORDER — HEPARIN SODIUM (PORCINE) 5000 UNIT/ML IJ SOLN
5000.0000 [IU] | Freq: Three times a day (TID) | INTRAMUSCULAR | Status: DC
  Administered 2015-07-05 – 2015-07-09 (×12): 5000 [IU] via SUBCUTANEOUS
  Filled 2015-07-05 (×9): qty 1

## 2015-07-05 MED ORDER — DEXTROSE-NACL 5-0.45 % IV SOLN: INTRAVENOUS | Status: DC

## 2015-07-05 NOTE — Telephone Encounter (Signed)
Transition Care Management Follow-up Telephone Call   Date discharged? 07/04/15   How have you been since you were released from the hospital? Pt states he is ok   Do you understand why you were in the hospital? YES   Do you understand the discharge instructions? YES   Where were you discharged to? Home   Items Reviewed:  Medications reviewed: YES  Allergies reviewed: YES  Dietary changes reviewed: NO  Referrals reviewed: YES, he stated no one has contacted him yet about home health   Functional Questionnaire:   Activities of Daily Living (ADLs):   He states he are independent in the following: ambulation, bathing and hygiene, feeding, continence, grooming, toileting and dressing States he doesn't require assistance    Any transportation issues/concerns?: NO   Any patient concerns? NO   Confirmed importance and date/time of follow-up visits scheduled YES, appt 07/09/15  Provider Appointment booked with Terri Piedra  Confirmed with patient if condition begins to worsen call PCP or go to the ER.  Patient was given the office number and encouraged to call back with question or concerns.  : YES

## 2015-07-05 NOTE — ED Notes (Signed)
Per EMS - pt d/c from ICU for hyperglycemia. Pt has not taken insulin since Monday. Pt has labored breathing and is tachypneic. CBG >600.

## 2015-07-05 NOTE — H&P (Signed)
PULMONARY / CRITICAL CARE MEDICINE   Name: [name] [name] MRN: [id number] DOB: [birth year]    ADMISSION DATE:  07/05/2015  REFERRING MD :  ER  CHIEF COMPLAINT:  Didn't take insulin  INITIAL PRESENTATION:  69 yo male was d/c from [facility] on 9/21 after DKA.  Had falling out with family and didn't take his insulin.  Presented to ER 9/22 with DKA.  SIGNIFICANT EVENTS: 9/22 Admit   HISTORY OF PRESENT ILLNESS:   He was sent home yesterday.  He had fight with family, and doesn't feel like they support him.  He wants to get new place to live.  He didn't take his insulin.  He started feeling short of breath and came to ER.  He was found to have hyperglycemia and anion gap.  PAST MEDICAL HISTORY :   has a past medical history of Diabetes mellitus without complication; Hypertension; Hernia; Back pain; Seizures; Diabetic neuropathy; and Stroke.  has past surgical history that includes Hip Arthroplasty (Right, 02/05/2013). Prior to Admission medications   Medication Sig Start Date End Date Taking? Authorizing Provider  ALPRAZolam [name]) 1 MG tablet Take 1 tablet (1 mg total) by mouth 2 (two) times daily as needed for anxiety. 05/11/15  Yes Golden Circle, [name]  amLODipine (NORVASC) 5 MG tablet Take 1 tablet (5 mg total) by mouth daily. 07/04/15  Yes Donita Brooks, NP  aspirin EC 325 MG tablet Take 1 tablet (325 mg total) by mouth daily. 06/08/15  Yes Rosalin Hawking, MD  atorvastatin (LIPITOR) 10 MG tablet Take 1 tablet (10 mg total) by mouth daily at 6 PM. 03/23/15  Yes [name], DO  FLUoxetine (PROZAC) 20 MG capsule Take 20 mg by mouth daily.   Yes Historical Provider, MD  folic acid (FOLVITE) 1 MG tablet Take 1 mg by mouth daily.   Yes Historical Provider, MD  insulin aspart protamine- aspart (NOVOLOG MIX 70/30) (70-30) 100 UNIT/ML injection Inject 0.18 mLs (18 Units total) into the skin 2 (two) times daily. 07/04/15  Yes Donita Brooks, NP  Oxycodone HCl 20 MG TABS Take 20 mg by mouth 4 (four) times  daily.  05/24/15  Yes Historical Provider, MD  blood glucose meter kit and supplies Dispense based on patient and insurance preference. Use up to four times daily as directed. (FOR ICD-9 250.00, 250.01). 07/04/15   Donita Brooks, NP   No Known Allergies  FAMILY HISTORY:  indicated that his mother is alive. He indicated that his father is deceased. He indicated that his maternal grandmother is deceased. He indicated that his maternal grandfather is deceased. He indicated that his paternal grandmother is deceased. He indicated that his paternal grandfather is deceased.  SOCIAL HISTORY:  reports that he has been smoking Cigarettes.  He has a 7.5 pack-year smoking history. He has never used smokeless tobacco. He reports that he drinks alcohol. He reports that he does not use illicit drugs.  REVIEW OF SYSTEMS:   Denies chest pain, abdominal pain, leg pain.  Feels thirsty and short of breath.  Denies sore throat, headache.  SUBJECTIVE:   VITAL SIGNS: Temp:  [98.2 F (36.8 C)-98.3 F (36.8 C)] 98.3 F (36.8 C) (09/22 0709) Pulse Rate:  [48-101] 48 (09/22 0830) Resp:  [16-36] 33 (09/22 0830) BP: (117-151)/(42-68) 137/48 mmHg (09/22 0830) SpO2:  [90 %-100 %] 90 % (09/22 0830) INTAKE / OUTPUT: No intake or output data in the 24 hours ending 07/05/15 0911  PHYSICAL EXAMINATION: General: tachypneic Neuro:  Mumbled speech,  moves extremities, follows commands HEENT:  Pupils reactive, dry mucosa, poor dentition Cardiovascular:  Regular, tachycardic Lungs:  No wheeze/rales Abdomen:  Soft, non tender, decreased bowel sounds Musculoskeletal:  No edema Skin:  Multiple scars on legs  LABS:  CBC  Recent Labs Lab 07/02/15 1840 07/03/15 0355 07/05/15 0732 07/05/15 0742  WBC 8.0 9.8 18.6*  --   HGB 9.2* 9.9* 11.7* 13.6  HCT 30.8* 30.0* 37.6* 40.0  PLT 148* 140* 171  --    BMET  Recent Labs Lab 07/03/15 1240 07/04/15 0407 07/05/15 0732 07/05/15 0742  NA 143 141 131* 132*  K 4.1 3.8  5.4* 5.4*  CL 113* 114* 97* 104  CO2 19* 21* <5*  --   BUN 35* 24* 32* 33*  CREATININE 2.07* 1.63* 2.71* 2.00*  GLUCOSE 301* 169* 863* >700*   Electrolytes  Recent Labs Lab 07/03/15 0355 07/03/15 1240 07/04/15 0407 07/05/15 0732  CALCIUM 7.9* 7.8* 7.8* 8.6*  MG 2.3  --   --   --   PHOS 2.4*  --   --   --    Sepsis Markers  Recent Labs Lab 07/02/15 1838 07/02/15 1840 07/03/15 0355 07/03/15 0656 07/05/15 0743  LATICACIDVEN 3.25*  --   --  2.9* 5.30*  PROCALCITON  --  6.87 10.36  --   --    ABG  Recent Labs Lab 07/02/15 1644 07/03/15 0517  PHART 7.167* 7.297*  PCO2ART 27.3* 39.1  PO2ART 179* 88.3   Liver Enzymes  Recent Labs Lab 07/02/15 1840 07/05/15 0732  AST 26 40  ALT 18 26  ALKPHOS 70 86  BILITOT 1.1 1.8*  ALBUMIN 3.4* 3.5   Cardiac Enzymes  Recent Labs Lab 07/02/15 1840 07/03/15 0427  TROPONINI 0.03 0.05*   Glucose  Recent Labs Lab 07/03/15 1647 07/03/15 2039 07/04/15 0735 07/04/15 1149 07/04/15 1640 07/05/15 0708  GLUCAP 284* 230* 328* 238* 206* >600*    Imaging Dg Chest Port 1 View  07/05/2015   CLINICAL DATA:  Shortness of breath for 2 days with right leg swelling. Confusion and tachycardia. History of hypertension, diabetes, stroke and chronic kidney disease. Initial encounter.  EXAM: PORTABLE CHEST - 1 VIEW  COMPARISON:  Radiographs 07/02/2015 and 03/20/2015.  FINDINGS: 0831 hours. The heart size and mediastinal contours are stable. There is improved aeration of the left lung base. The lungs are clear. There is no pleural effusion or pneumothorax. The bones appear unremarkable. Telemetry leads overlie the chest.  IMPRESSION: No active cardiopulmonary process.   Electronically Signed   By: Richardean Sale M.D.   On: 07/05/2015 08:41     ASSESSMENT / PLAN:  DKA 2nd to non compliance with insulin regimen at home. 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 Plan: - insulin gtt until anion gap closed - f/u BMET, Mg, Ph - continue IV  fluids >> change to D5 1/2 NS when CBG < 250 - NPO for now  Hyperkalemia in setting of DKA. Plan: - f/u BMET  Hx of HTN, CVA, HLD. Plan: - hold outpt amlodipine, lipitor, ASA for now  CKD stage 3. Plan: - monitor renal fx, urine outpt  Hx of depression. Plan: - hold outpt prozac, xanax for now  SQ heparin for DVT prevention. SUP not indicated. Full code.  Chesley Mires, MD [facility] Pulmonary/Critical Care 07/05/2015, 9:18 AM Pager:  [phone] After 3pm call: [phone]

## 2015-07-05 NOTE — ED Provider Notes (Signed)
CSN: [id number]     Arrival date & time 07/05/15  8756 History   None    Chief Complaint  Patient presents with  . Hyperglycemia   HPI  [name] [name] is a 69 year old male with PMHx of DM2, HTN and CVA presenting with hyperglycemia. Pt was discharged yesterday from [facility] after admission for DKA. Pt reports that he has not taken his insulin since discharge yesterday. Pt has long history of noncompliance. Pt is mildly confused on interview. He will not explain why he did not take his insulin. He states he feels short of breath which is why he called EMS. Denies fevers, chills, headaches, blurred vision, dizziness, syncope, sore throat, chest pain, cough, abdominal pain, nausea or vomiting.  Past Medical History  Diagnosis Date  . Diabetes mellitus without complication   . Hypertension   . Hernia   . Back pain   . Seizures   . Diabetic neuropathy   . Stroke    Past Surgical History  Procedure Laterality Date  . Hip arthroplasty Right 02/05/2013    Procedure: ARTHROPLASTY BIPOLAR HIP;  Surgeon: [name], MD;  Location: [facility] ORS;  Service: Orthopedics;  Laterality: Right;   Family History  Problem Relation Age of Onset  . Diabetes Mother   . Alzheimer's disease Mother   . Hypertension Mother   . Hyperlipidemia Father   . Hypertension Father   . Healthy Maternal Grandmother   . Pneumonia Maternal Grandfather    Social History  Substance Use Topics  . Smoking status: Current Every Day Smoker -- 0.25 packs/day for 30 years    Types: Cigarettes    Last Attempt to Quit: 01/31/2013  . Smokeless tobacco: Never Used  . Alcohol Use: Yes    Review of Systems  Constitutional: Negative for fever, chills and diaphoresis.  HENT: Negative for congestion and sore throat.   Eyes: Negative for visual disturbance.  Respiratory: Positive for shortness of breath. Negative for cough.   Cardiovascular: Negative for chest pain.  Gastrointestinal: Negative for nausea, vomiting and abdominal pain.   Genitourinary: Negative for dysuria and flank pain.  Musculoskeletal: Negative for back pain and neck pain.  Skin: Negative for rash.  Neurological: Negative for dizziness, syncope and headaches.  Psychiatric/Behavioral: Positive for confusion.      Allergies  Review of patient's allergies indicates no known allergies.  Home Medications   Prior to Admission medications   Medication Sig Start Date End Date Taking? Authorizing Provider  ALPRAZolam [name]) 1 MG tablet Take 1 tablet (1 mg total) by mouth 2 (two) times daily as needed for anxiety. 05/11/15  Yes Golden Circle, [name]  amLODipine (NORVASC) 5 MG tablet Take 1 tablet (5 mg total) by mouth daily. 07/04/15  Yes Donita Brooks, NP  aspirin EC 325 MG tablet Take 1 tablet (325 mg total) by mouth daily. 06/08/15  Yes Rosalin Hawking, MD  atorvastatin (LIPITOR) 10 MG tablet Take 1 tablet (10 mg total) by mouth daily at 6 PM. 03/23/15  Yes [name], DO  FLUoxetine (PROZAC) 20 MG capsule Take 20 mg by mouth daily.   Yes Historical Provider, MD  folic acid (FOLVITE) 1 MG tablet Take 1 mg by mouth daily.   Yes Historical Provider, MD  insulin aspart protamine- aspart (NOVOLOG MIX 70/30) (70-30) 100 UNIT/ML injection Inject 0.18 mLs (18 Units total) into the skin 2 (two) times daily. 07/04/15  Yes Donita Brooks, NP  Oxycodone HCl 20 MG TABS Take 20 mg  by mouth 4 (four) times daily.  05/24/15  Yes Historical Provider, MD  blood glucose meter kit and supplies Dispense based on patient and insurance preference. Use up to four times daily as directed. (FOR ICD-9 250.00, 250.01). 07/04/15   Donita Brooks, NP   BP 117/54 mmHg  Pulse 63  Temp(Src) 98.5 F (36.9 C) (Oral)  Resp 18  Ht _0  (1.753 m)  Wt 175 lb 4.3 oz (79.5 kg)  BMI 25.87 kg/m2  SpO2 98% Physical Exam  Constitutional: He appears well-developed and well-nourished. No distress.  Mumbling, mildly confused  HENT:  Head: Normocephalic and atraumatic.  Mouth/Throat: No  oropharyngeal exudate.  Dry mucus membranes  Eyes: Conjunctivae and EOM are normal. Pupils are equal, round, and reactive to light. Right eye exhibits no discharge. Left eye exhibits no discharge. No scleral icterus.  Neck: Normal range of motion.  Cardiovascular: Normal rate, regular rhythm and normal heart sounds.   Pulmonary/Chest: He is in respiratory distress. He has no wheezes. He has no rales.  Tachypneic, pt with increased work of breathing  Abdominal: Soft. He exhibits no distension. There is no tenderness. There is no rebound and no guarding.  Musculoskeletal: Normal range of motion.  Pt moves extremities spontaneously.  Neurological: He is alert. No cranial nerve deficit. Coordination normal.  5/5 motor strength BUE and BLE. Sensation to light touch intact.   Skin: Skin is warm and dry. No rash noted.  Psychiatric: He has a normal mood and affect. His behavior is normal.  Nursing note and vitals reviewed.   ED Course  Procedures (including critical care time)  CRITICAL CARE Performed by: Eston Esters Total critical care time: 30 Critical care time was exclusive of separately billable procedures and treating other patients. Critical care was necessary to treat or prevent imminent or life-threatening deterioration. Critical care was time spent personally by me on the following activities: development of treatment plan with patient and/or surrogate as well as nursing, discussions with consultants, evaluation of patient's response to treatment, examination of patient, obtaining history from patient or surrogate, ordering and performing treatments and interventions, ordering and review of laboratory studies, ordering and review of radiographic studies, pulse oximetry and re-evaluation of patient's condition.  Labs Review Labs Reviewed  CBC WITH DIFFERENTIAL/PLATELET - Abnormal; Notable for the following:    WBC 18.6 (*)    RBC 3.82 (*)    Hemoglobin 11.7 (*)    HCT 37.6 (*)     Neutro Abs 15.8 (*)    Monocytes Absolute 1.5 (*)    All other components within normal limits  COMPREHENSIVE METABOLIC PANEL - Abnormal; Notable for the following:    Sodium 131 (*)    Potassium 5.4 (*)    Chloride 97 (*)    CO2 <5 (*)    Glucose, Bld 863 (*)    BUN 32 (*)    Creatinine, Ser 2.71 (*)    Calcium 8.6 (*)    Total Bilirubin 1.8 (*)    GFR calc non Af Amer 22 (*)    GFR calc Af Amer 26 (*)    All other components within normal limits  URINALYSIS, ROUTINE W REFLEX MICROSCOPIC (NOT AT [facility]) - Abnormal; Notable for the following:    Glucose, UA >1000 (*)    Hgb urine dipstick MODERATE (*)    Ketones, ur >80 (*)    Protein, ur 30 (*)    All other components within normal limits  BASIC METABOLIC PANEL - Abnormal; Notable  for the following:    Potassium 5.3 (*)    CO2 8 (*)    Glucose, Bld 642 (*)    BUN 34 (*)    Creatinine, Ser 2.65 (*)    Calcium 8.5 (*)    GFR calc non Af Amer 23 (*)    GFR calc Af Amer 26 (*)    Anion gap 24 (*)    All other components within normal limits  BASIC METABOLIC PANEL - Abnormal; Notable for the following:    CO2 14 (*)    Glucose, Bld 406 (*)    BUN 32 (*)    Creatinine, Ser 2.53 (*)    Calcium 8.4 (*)    GFR calc non Af Amer 24 (*)    GFR calc Af Amer 28 (*)    Anion gap 16 (*)    All other components within normal limits  LACTIC ACID, PLASMA - Abnormal; Notable for the following:    Lactic Acid, Venous 3.9 (*)    All other components within normal limits  URINE MICROSCOPIC-ADD ON - Abnormal; Notable for the following:    Squamous Epithelial / LPF FEW (*)    Bacteria, UA FEW (*)    Casts GRANULAR CAST (*)    All other components within normal limits  CBC - Abnormal; Notable for the following:    WBC 15.2 (*)    RBC 3.34 (*)    Hemoglobin 10.2 (*)    HCT 30.8 (*)    All other components within normal limits  CBG MONITORING, ED - Abnormal; Notable for the following:    Glucose-Capillary >600 (*)    All other  components within normal limits  I-STAT CHEM 8, ED - Abnormal; Notable for the following:    Sodium 132 (*)    Potassium 5.4 (*)    BUN 33 (*)    Creatinine, Ser 2.00 (*)    Glucose, Bld >700 (*)    Calcium, Ion 1.09 (*)    All other components within normal limits  I-STAT CG4 LACTIC ACID, ED - Abnormal; Notable for the following:    Lactic Acid, Venous 5.30 (*)    All other components within normal limits  CBG MONITORING, ED - Abnormal; Notable for the following:    Glucose-Capillary >600 (*)    All other components within normal limits  MRSA PCR SCREENING  CULTURE, BLOOD (ROUTINE X 2)  CULTURE, BLOOD (ROUTINE X 2)  URINE CULTURE  ETHANOL  MAGNESIUM  PHOSPHORUS  BASIC METABOLIC PANEL  BASIC METABOLIC PANEL  BASIC METABOLIC PANEL  CBC  MAGNESIUM  PHOSPHORUS  BASIC METABOLIC PANEL    Imaging Review Dg Chest Port 1 View  07/05/2015   CLINICAL DATA:  Shortness of breath for 2 days with right leg swelling. Confusion and tachycardia. History of hypertension, diabetes, stroke and chronic kidney disease. Initial encounter.  EXAM: PORTABLE CHEST - 1 VIEW  COMPARISON:  Radiographs 07/02/2015 and 03/20/2015.  FINDINGS: 0831 hours. The heart size and mediastinal contours are stable. There is improved aeration of the left lung base. The lungs are clear. There is no pleural effusion or pneumothorax. The bones appear unremarkable. Telemetry leads overlie the chest.  IMPRESSION: No active cardiopulmonary process.   Electronically Signed   By: Richardean Sale M.D.   On: 07/05/2015 08:41   I have personally reviewed and evaluated these images and lab results as part of my medical decision-making.   EKG Interpretation   Date/Time:  Thursday July 05 2015 30:86:57 EDT  Ventricular Rate:  97 PR Interval:  157 QRS Duration: 93 QT Interval:  402 QTC Calculation: 511 R Axis:   -49 Text Interpretation:  Sinus rhythm LAD, consider left anterior fascicular  block Probable anteroseptal  infarct, old Prolonged QT interval Confirmed  by KNOTT MD, DANIEL ([id number]) on 07/05/2015 7:15:43 AM      MDM   Final diagnoses:  Hyperglycemia   Pt with uncontrolled diabetes here was just discharged from [facility] hospital for DKA. He's back with hyperglycemia and confusion. He hasn't taken his insulin since discharge. His blood glucose is 863. Lactic acid is 5.3. White count of 18.6. Potassium of 5.4. He's also got AKI; creatinine 2.71, up from 1.63 yesterday. Glucose stabilizer started in ED. Consulted critical care who will admit pt to ICU for stabilization.     Josephina Gip, PA-C 07/05/15 1851  Leo Grosser, MD 07/06/15 1714

## 2015-07-05 NOTE — ED Notes (Signed)
Attempted report 

## 2015-07-05 NOTE — ED Notes (Signed)
Family at bedside. 

## 2015-07-05 NOTE — ED Provider Notes (Addendum)
Medical screening examination/treatment/procedure(s) were conducted as a shared visit with non-physician practitioner(s) and myself.  I personally evaluated the patient during the encounter.   EKG Interpretation   Date/Time:  Thursday July 05 2015 07:06:24 EDT Ventricular Rate:  97 PR Interval:  157 QRS Duration: 93 QT Interval:  402 QTC Calculation: 511 R Axis:   -49 Text Interpretation:  Sinus rhythm LAD, consider left anterior fascicular  block Probable anteroseptal infarct, old Prolonged QT interval Confirmed  by KNOTT MD, Quillian Quince [phone]) on 07/05/2015 7:15:43 AM      CRITICAL CARE Performed by: Leo Grosser Total critical care time: 30 minutes Critical care time was exclusive of separately billable procedures and treating other patients. Critical care was necessary to treat or prevent imminent or life-threatening deterioration. Critical care was time spent personally by me on the following activities: development of treatment plan with patient and/or surrogate as well as nursing, discussions with consultants, evaluation of patient's response to treatment, examination of patient, obtaining history from patient or surrogate, ordering and performing treatments and interventions, ordering and review of laboratory studies, ordering and review of radiographic studies, pulse oximetry and re-evaluation of patient's condition.   69 year old male with history of multiple admissions for hyperglycemia presents with noncompliance with insulin therapy, patient is mildly confused and tachypneic on exam but otherwise hemodynamically stable. Suspect hyperosmolar nonketotic state. Plan for fluid resuscitation, insulin bolus and infusion, admission to the hospital. Patient with evident lactic acidosis and severely elevated glucose suggestive of volume depletion.  See related encounter note    Leo Grosser, MD 07/05/15 [phone]

## 2015-07-05 NOTE — Progress Notes (Signed)
eLink Physician-Brief Progress Note Patient Name: [name] [name] DOB: 1946-08-25 MRN: [id number]   Date of Service  07/05/2015  HPI/Events of Note  Currently on insulin IV infusion at 0.4 units/hour and anion gap = 6.  eICU Interventions  Will transition to Lantus and Q 4 hour sensitive Novolog SSI.      Intervention Category Major Interventions: Hyperglycemia - active titration of insulin therapy  Lysle Dingwall 07/05/2015, 10:52 PM

## 2015-07-05 NOTE — Progress Notes (Signed)
eLink Physician-Brief Progress Note Patient Name: [name] [name] DOB: Jul 25, 1946 MRN: [id number]   Date of Service  07/05/2015  HPI/Events of Note  K+ = 5.6. Specimen noted to be "slightly hemolyzed".  eICU Interventions  Will resend BMP.     Intervention Category Intermediate Interventions: Electrolyte abnormality - evaluation and management  Sommer,Steven Eugene 07/05/2015, 9:00 PM

## 2015-07-06 LAB — BASIC METABOLIC PANEL
Anion gap: 7 (ref 5–15)
BUN: 26 mg/dL — ABNORMAL HIGH (ref 6–20)
CO2: 20 mmol/L — ABNORMAL LOW (ref 22–32)
Calcium: 8 mg/dL — ABNORMAL LOW (ref 8.9–10.3)
Chloride: 114 mmol/L — ABNORMAL HIGH (ref 101–111)
Creatinine, Ser: 1.8 mg/dL — ABNORMAL HIGH (ref 0.61–1.24)
GFR calc Af Amer: 42 mL/min — ABNORMAL LOW (ref >60)
GFR calc non Af Amer: 36 mL/min — ABNORMAL LOW (ref >60)
Glucose, Bld: 207 mg/dL — ABNORMAL HIGH (ref 65–99)
Potassium: 4 mmol/L (ref 3.5–5.1)
Sodium: 141 mmol/L (ref 135–145)

## 2015-07-06 LAB — URINE CULTURE: Culture: NO GROWTH

## 2015-07-06 LAB — MAGNESIUM: Magnesium: 1.9 mg/dL (ref 1.7–2.4)

## 2015-07-06 LAB — CBC
HCT: 31.1 % — ABNORMAL LOW (ref 39.0–52.0)
Hemoglobin: 10.8 g/dL — ABNORMAL LOW (ref 13.0–17.0)
MCH: 31.1 pg (ref 26.0–34.0)
MCHC: 34.7 g/dL (ref 30.0–36.0)
MCV: 89.6 fL (ref 78.0–100.0)
Platelets: 163 10*3/uL (ref 150–400)
RBC: 3.47 MIL/uL — ABNORMAL LOW (ref 4.22–5.81)
RDW: 14 % (ref 11.5–15.5)
WBC: 11.3 10*3/uL — ABNORMAL HIGH (ref 4.0–10.5)

## 2015-07-06 LAB — PHOSPHORUS: Phosphorus: 2.4 mg/dL — ABNORMAL LOW (ref 2.5–4.6)

## 2015-07-06 LAB — GLUCOSE, CAPILLARY
Glucose-Capillary: 112 mg/dL — ABNORMAL HIGH (ref 65–99)
Glucose-Capillary: 103 mg/dL — ABNORMAL HIGH (ref 65–99)
Glucose-Capillary: 105 mg/dL — ABNORMAL HIGH (ref 65–99)
Glucose-Capillary: 189 mg/dL — ABNORMAL HIGH (ref 65–99)
Glucose-Capillary: 82 mg/dL (ref 65–99)
Glucose-Capillary: 190 mg/dL — ABNORMAL HIGH (ref 65–99)
Glucose-Capillary: 234 mg/dL — ABNORMAL HIGH (ref 65–99)
Glucose-Capillary: 271 mg/dL — ABNORMAL HIGH (ref 65–99)
Glucose-Capillary: 70 mg/dL (ref 65–99)

## 2015-07-06 MED ORDER — INSULIN ASPART 100 UNIT/ML ~~LOC~~ SOLN
0.0000 [IU] | Freq: Three times a day (TID) | SUBCUTANEOUS | Status: DC
  Administered 2015-07-06: 3 [IU] via SUBCUTANEOUS
  Administered 2015-07-06: 5 [IU] via SUBCUTANEOUS
  Administered 2015-07-07: 9 [IU] via SUBCUTANEOUS
  Administered 2015-07-07: 2 [IU] via SUBCUTANEOUS
  Administered 2015-07-08: 9 [IU] via SUBCUTANEOUS
  Administered 2015-07-08: 1 [IU] via SUBCUTANEOUS
  Administered 2015-07-08: 3 [IU] via SUBCUTANEOUS

## 2015-07-06 MED ORDER — ATORVASTATIN CALCIUM 10 MG PO TABS
10.0000 mg | ORAL_TABLET | Freq: Every day | ORAL | Status: DC
  Administered 2015-07-06 – 2015-07-08 (×3): 10 mg via ORAL
  Filled 2015-07-06 (×3): qty 1

## 2015-07-06 MED ORDER — INSULIN ASPART 100 UNIT/ML ~~LOC~~ SOLN: 0.0000 [IU] | Freq: Every day | SUBCUTANEOUS | Status: DC

## 2015-07-06 MED ORDER — INSULIN ASPART PROT & ASPART (70-30 MIX) 100 UNIT/ML ~~LOC~~ SUSP
18.0000 [IU] | Freq: Two times a day (BID) | SUBCUTANEOUS | Status: DC
  Administered 2015-07-06 – 2015-07-09 (×5): 18 [IU] via SUBCUTANEOUS
  Filled 2015-07-06 (×2): qty 10

## 2015-07-06 MED ORDER — AMLODIPINE BESYLATE 5 MG PO TABS
5.0000 mg | ORAL_TABLET | Freq: Every day | ORAL | Status: DC
  Administered 2015-07-06 – 2015-07-09 (×4): 5 mg via ORAL
  Filled 2015-07-06 (×4): qty 1

## 2015-07-06 MED ORDER — FLUOXETINE HCL 20 MG PO CAPS
20.0000 mg | ORAL_CAPSULE | Freq: Every day | ORAL | Status: DC
  Administered 2015-07-06 – 2015-07-09 (×4): 20 mg via ORAL
  Filled 2015-07-06 (×4): qty 1

## 2015-07-06 MED ORDER — ASPIRIN EC 325 MG PO TBEC
325.0000 mg | DELAYED_RELEASE_TABLET | Freq: Every day | ORAL | Status: DC
  Administered 2015-07-06 – 2015-07-09 (×4): 325 mg via ORAL
  Filled 2015-07-06 (×4): qty 1

## 2015-07-06 NOTE — Progress Notes (Signed)
Inpatient Diabetes Program Recommendations  AACE/ADA: New Consensus Statement on Inpatient Glycemic Control (2015)  Target Ranges:  Prepandial:   less than 140 mg/dL      Peak postprandial:   less than 180 mg/dL (1-2 hours)      Critically ill patients:  140 - 180 mg/dL   Review of Glycemic Control:  Results for YOSGAR, WEISENBERG (MRN [id number]) as of 07/06/2015 16:00  Ref. Range 07/05/2015 23:55 07/06/2015 03:46 07/06/2015 07:45 07/06/2015 11:28  Glucose-Capillary Latest Ref Range: 65-99 mg/dL 112 (H) 189 (H) 190 (H) 234 (H)   Diabetes history: Type 2 diabetes Outpatient Diabetes medications:  Novolog 70/30 18 units bid Current orders for Inpatient glycemic control:  Lantus 10 units daily, Novolog sensitive tid with meals and HS  Inpatient Diabetes Program Recommendations:     May consider d/c of Lantus and restart of Novolog 70/30 15 units bid.  Spoke with patient. He states that after being discharged home from the hospital, he went home and fell asleep.  He did not take his PM dose of 70/30 on 07/04/15.  Patient returned to the ED the next morning.  Patient states that he plans to go to his mothers after discharge.  He states that he does have insulin in his refrigerator however needs to go to "[facility]" to update his prescriptions.  May benefit from home health at discharge to assess home environment.  Discussed with RN.  She states that sisters and daughter are supportive.  May also need to involve family in discharge planning.  He states he has a meter and supplies at home but just needs to "take his medicine".  Thanks, Adah Perl, RN, BC-ADM Inpatient Diabetes Coordinator Pager [phone] (8a-5p)

## 2015-07-06 NOTE — Progress Notes (Signed)
PULMONARY / CRITICAL CARE MEDICINE   Name: [name] [name] MRN: [id number] DOB: [birth year]    ADMISSION DATE:  07/05/2015  REFERRING MD :  ER  CHIEF COMPLAINT:  Insulin non-compliance  INITIAL PRESENTATION: 69 y/o male was d/c from [facility] on 9/21 after DKA.  Had falling out with family and didn't take his insulin.  Presented to ER 9/22 with DKA.  SIGNIFICANT EVENTS: 9/22 Admit 9/22 Transition to Lantus  SUBJECTIVE: Doing well this AM, no complaints. Off insulin gtt  VITAL SIGNS: Temp:  [98 F (36.7 C)-98.6 F (37 C)] 98.6 F (37 C) (09/23 0344) Pulse Rate:  [47-117] 55 (09/23 0700) Resp:  [0-36] 20 (09/23 0700) BP: (94-161)/(42-73) 136/57 mmHg (09/23 0700) SpO2:  [90 %-100 %] 98 % (09/23 0700) Weight:  [175 lb 4.3 oz (79.5 kg)] 175 lb 4.3 oz (79.5 kg) (09/22 1032)   INTAKE / OUTPUT:  Intake/Output Summary (Last 24 hours) at 07/06/15 0735 Last data filed at 07/06/15 0600  Gross per 24 hour  Intake 1793.61 ml  Output   1050 ml  Net 743.61 ml    PHYSICAL EXAMINATION:  General: AA male, alert, cooperative, NAD. HEENT: PERRL, EOMI. Moist mucus membranes Neck: Full range of motion without pain, supple, no lymphadenopathy or carotid bruits Lungs: Clear to ascultation bilaterally, normal work of respiration, no wheezes, rales, rhonchi Heart: RRR, no murmurs, gallops, or rubs Abdomen: Soft, non-tender, non-distended, BS + Extremities: No cyanosis, clubbing, or edema Neurologic: Alert & oriented x3, cranial nerves II-XII intact, strength grossly intact, sensation intact to light touch   LABS:  CBC  Recent Labs Lab 07/05/15 0732 07/05/15 0742 07/05/15 1340 07/06/15 0555  WBC 18.6*  --  15.2* 11.3*  HGB 11.7* 13.6 10.2* 10.8*  HCT 37.6* 40.0 30.8* 31.1*  PLT 171  --  160 163   BMET  Recent Labs Lab 07/05/15 1340 07/05/15 1956 07/05/15 2120  NA 141 143 142  K 4.2 5.6* 4.1  CL 111 117* 114*  CO2 14* 16* 22  BUN 32* 32* 30*  CREATININE 2.53* 2.11* 2.02*   GLUCOSE 406* 95 119*   Electrolytes  Recent Labs Lab 07/03/15 0355  07/05/15 1141 07/05/15 1340 07/05/15 1956 07/05/15 2120 07/06/15 0555  CALCIUM 7.9*  < > 8.5* 8.4* 8.2* 8.3*  --   MG 2.3  --  2.1  --   --   --  1.9  PHOS 2.4*  --  4.4  --   --   --  2.4*  < > = values in this interval not displayed.   Sepsis Markers  Recent Labs Lab 07/02/15 1840 07/03/15 0355 07/03/15 0656 07/05/15 0743 07/05/15 1120  LATICACIDVEN  --   --  2.9* 5.30* 3.9*  PROCALCITON 6.87 10.36  --   --   --    ABG  Recent Labs Lab 07/02/15 1644 07/03/15 0517  PHART 7.167* 7.297*  PCO2ART 27.3* 39.1  PO2ART 179* 88.3   Liver Enzymes  Recent Labs Lab 07/02/15 1840 07/05/15 0732  AST 26 40  ALT 18 26  ALKPHOS 70 86  BILITOT 1.1 1.8*  ALBUMIN 3.4* 3.5   Cardiac Enzymes  Recent Labs Lab 07/02/15 1840 07/03/15 0427  TROPONINI 0.03 0.05*   Glucose  Recent Labs Lab 07/05/15 1947 07/05/15 2050 07/05/15 2149 07/05/15 2309 07/05/15 2355 07/06/15 0346  GLUCAP 122* 103* 82 105* 112* 189*    Imaging Dg Chest Port 1 View  07/05/2015   CLINICAL DATA:  Shortness of breath for 2 days with  right leg swelling. Confusion and tachycardia. History of hypertension, diabetes, stroke and chronic kidney disease. Initial encounter.  EXAM: PORTABLE CHEST - 1 VIEW  COMPARISON:  Radiographs 07/02/2015 and 03/20/2015.  FINDINGS: 0831 hours. The heart size and mediastinal contours are stable. There is improved aeration of the left lung base. The lungs are clear. There is no pleural effusion or pneumothorax. The bones appear unremarkable. Telemetry leads overlie the chest.  IMPRESSION: No active cardiopulmonary process.   Electronically Signed   By: Richardean Sale M.D.   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 Plan: -ISS + Lantus 10 units -BMP in AM -KVO -Start carb mod diet  Hyperkalemia in setting of DKA;  resolved Plan: -BMP in AM  H/o of HTN, CVA, HLD. Plan: -Restart Amlodipine, ASA, Lipitor  CKD stage 3 Plan: -BMP in AM, monitor urine output  H/o of depression Plan: -Restart Prozac  SQ heparin  SUP not indicated Full code.  Transfer to med surg, [facility]   Natasha Bence, MD PGY-3, Internal Medicine Pager: [phone]  STAFF NOTE: Linwood Dibbles, MD FACP have personally reviewed patient's available data, including medical history, events of note, physical examination and test results as part of my evaluation. I have discussed with resident/NP and other care providers such as pharmacist, RN and RRT. In addition, I personally evaluated patient and elicited key findings of: awake, alert, no distress, goal out of bed, gap closed, off insulin drip, added lantus, continued SSI, diet started, not infected, re assess bmet in am, allow pos balance, needs dm coordinator, to [facility], med  Lavon Paganini. Titus Mould, MD, [place] Pgr: [facility] 07/06/2015 9:08 AM

## 2015-07-06 NOTE — Progress Notes (Signed)
07/06/2015 6:29 PM  Report received. Room is ready for patient.  [employer], RN-BC, [employer] [facility] [address] Phone [phone]

## 2015-07-06 NOTE — Care Management Note (Signed)
Case Management Note  Patient Details  Name: [name] [name] MRN: [id number] Date of Birth: 15-May-1946  Subjective/Objective:  Lives at home alone, on last discharge went home and did not eat or take his insulin.  States his cousin was to stay with him but they were late.  When discharge this time states he is going home to his mothers.  She has a woman that cooks for her and will cook for him too.  States she also has his insulin.  Patient gave me permission to call sister [name] [name].  Called [place].  States that the above is correct, Mom is 13 and patient sisters, at least one of them is with her all the time also and they do the cooking and have all assisted with patient also, but it is much easier to manage him when he is with them and not on his own.  Plan for him to be there with them until they can arrange for him to have more assistance.  [name] [name] also was unaware of change in insulin from last admission as they usually get his meds.  Informed that when he is discharged this time to make sure she has the discharge paperwork.  She would like to be informed when he is discharging.  She would benefit from discharge instructions prior to his discharge also.                      Action/Plan:   Expected Discharge Date:  07/07/15               Expected Discharge Plan:  Home/Self Care  In-House Referral:     Discharge planning Services  CM Consult  Post Acute Care Choice:    Choice offered to:     DME Arranged:    DME Agency:     HH Arranged:    HH Agency:     Status of Service:  In process, will continue to follow  Medicare Important Message Given:    Date Medicare IM Given:    Medicare IM give by:    Date Additional Medicare IM Given:    Additional Medicare Important Message give by:     If discussed at [place] of Stay Meetings, dates discussed:    Additional Comments:  Vergie Living, RN 07/06/2015, 10:37 AM

## 2015-07-06 NOTE — Progress Notes (Signed)
eLink Physician-Brief Progress Note Patient Name: [name] [name] DOB: 1946/01/02 MRN: [id number]   Date of Service  07/06/2015  HPI/Events of Note  -transitioned with lantus CBGs high  eICU Interventions  Change to 70/30 home dose per diabetes RN     Intervention Category Intermediate Interventions: Hyperglycemia - evaluation and treatment  ALVA,RAKESH V. 07/06/2015, 4:45 PM

## 2015-07-07 ENCOUNTER — Inpatient Hospital Stay (HOSPITAL_COMMUNITY): Payer: Medicare HMO

## 2015-07-07 DIAGNOSIS — E876 Hypokalemia: Secondary | ICD-10-CM

## 2015-07-07 DIAGNOSIS — I10 Essential (primary) hypertension: Secondary | ICD-10-CM

## 2015-07-07 DIAGNOSIS — Z8673 Personal history of transient ischemic attack (TIA), and cerebral infarction without residual deficits: Secondary | ICD-10-CM

## 2015-07-07 DIAGNOSIS — E1310 Other specified diabetes mellitus with ketoacidosis without coma: Principal | ICD-10-CM

## 2015-07-07 DIAGNOSIS — E0810 Diabetes mellitus due to underlying condition with ketoacidosis without coma: Secondary | ICD-10-CM

## 2015-07-07 DIAGNOSIS — Z794 Long term (current) use of insulin: Secondary | ICD-10-CM

## 2015-07-07 DIAGNOSIS — E119 Type 2 diabetes mellitus without complications: Secondary | ICD-10-CM

## 2015-07-07 DIAGNOSIS — R627 Adult failure to thrive: Secondary | ICD-10-CM

## 2015-07-07 DIAGNOSIS — R4781 Slurred speech: Secondary | ICD-10-CM

## 2015-07-07 LAB — BASIC METABOLIC PANEL
Anion gap: 6 (ref 5–15)
BUN: 14 mg/dL (ref 6–20)
CO2: 23 mmol/L (ref 22–32)
Calcium: 7.8 mg/dL — ABNORMAL LOW (ref 8.9–10.3)
Chloride: 104 mmol/L (ref 101–111)
Creatinine, Ser: 1.25 mg/dL — ABNORMAL HIGH (ref 0.61–1.24)
GFR calc Af Amer: >60 mL/min (ref >60)
GFR calc non Af Amer: 57 mL/min — ABNORMAL LOW (ref >60)
Glucose, Bld: 225 mg/dL — ABNORMAL HIGH (ref 65–99)
Potassium: 3.3 mmol/L — ABNORMAL LOW (ref 3.5–5.1)
Sodium: 133 mmol/L — ABNORMAL LOW (ref 135–145)

## 2015-07-07 LAB — GLUCOSE, CAPILLARY
Glucose-Capillary: 264 mg/dL — ABNORMAL HIGH (ref 65–99)
Glucose-Capillary: 369 mg/dL — ABNORMAL HIGH (ref 65–99)
Glucose-Capillary: 190 mg/dL — ABNORMAL HIGH (ref 65–99)
Glucose-Capillary: 79 mg/dL (ref 65–99)

## 2015-07-07 LAB — CULTURE, BLOOD (ROUTINE X 2)
Culture: NO GROWTH
Culture: NO GROWTH

## 2015-07-07 MED ORDER — INSULIN ASPART 100 UNIT/ML ~~LOC~~ SOLN
5.0000 [IU] | Freq: Three times a day (TID) | SUBCUTANEOUS | Status: DC
  Administered 2015-07-07 – 2015-07-09 (×5): 5 [IU] via SUBCUTANEOUS

## 2015-07-07 MED ORDER — OXYCODONE HCL 5 MG PO TABS
5.0000 mg | ORAL_TABLET | Freq: Four times a day (QID) | ORAL | Status: DC | PRN
  Administered 2015-07-07 (×3): 5 mg via ORAL
  Filled 2015-07-07 (×3): qty 1

## 2015-07-07 MED ORDER — POTASSIUM CHLORIDE CRYS ER 20 MEQ PO TBCR
40.0000 meq | EXTENDED_RELEASE_TABLET | Freq: Once | ORAL | Status: AC
  Administered 2015-07-07: 40 meq via ORAL
  Filled 2015-07-07: qty 2

## 2015-07-07 MED ORDER — OXYCODONE HCL 5 MG PO TABS
10.0000 mg | ORAL_TABLET | Freq: Four times a day (QID) | ORAL | Status: DC | PRN
  Administered 2015-07-07: 10 mg via ORAL
  Administered 2015-07-07: 5 mg via ORAL
  Administered 2015-07-08 (×2): 10 mg via ORAL
  Filled 2015-07-07 (×4): qty 2

## 2015-07-07 NOTE — Progress Notes (Signed)
PROGRESS NOTE  [name] [name] [id number] DOB: 25-Jul-1946 DOA: 07/05/2015 PCP: Mauricio Po, [name]  HPI/Recap of past 24 hours:  Patient is transferred from ICU to [facility] after DKA resolved. Reported feeling better, reported slurred speechx1week.  Assessment/Plan: Active Problems:   DKA (diabetic ketoacidoses)  DKA: required ICU admission, resolved, transferred to hospitalist service, likely from meds/diet noncompliant,   Insulin dependent DM2, a1c pending, continue adjust insulin dose, appreciate diabetes RN input, patient need ongoing education,   HTN: stable on home meds.  Hypokalemia: replace k.  Slurred speech: MRI brain pending. Patient reported past h/o cva, but slurred speech is new in the last week. Does has a neurology follow up appointment on 9/28  Per chart patient has history of seizure; patient denies.   FTT: recent multiple hospitalizations, PT/OT, likely will need home health  Code Status: full  Family Communication: patient   Disposition Plan: home to family, likely will need home health due to noncompliant   Consultants:  None ( is transferred from [facility], PCCM signed off)  Procedures:  none  Antibiotics:  none   Objective: BP 136/67 mmHg  Pulse 62  Temp(Src) 98.4 F (36.9 C) (Oral)  Resp 19  Ht 5\' 9"  (1.753 m)  Wt 175 lb 4.3 oz (79.5 kg)  BMI 25.87 kg/m2  SpO2 100%  Intake/Output Summary (Last 24 hours) at 07/07/15 1509 Last data filed at 07/07/15 1431  Gross per 24 hour  Intake   1200 ml  Output   3725 ml  Net  -2525 ml   Filed Weights   07/05/15 1032  Weight: 175 lb 4.3 oz (79.5 kg)    Exam:   General:  NAD  Cardiovascular: RRR  Respiratory: CTABL  Abdomen: Soft/ND/NT, positive BS  Musculoskeletal: No Edema  Neuro: slurred speech, no obvious focal deficit.  Data Reviewed: Basic Metabolic Panel:  Recent Labs Lab 07/03/15 0355  07/05/15 1141 07/05/15 1340 07/05/15 1956 07/05/15 2120 07/06/15 0555  07/06/15 0826 07/07/15 0526  NA 147*  < > 137 141 143 142  --  141 133*  K 3.2*  < > 5.3* 4.2 5.6* 4.1  --  4.0 3.3*  CL 118*  < > 105 111 117* 114*  --  114* 104  CO2 19*  < > 8* 14* 16* 22  --  20* 23  GLUCOSE 294*  < > 642* 406* 95 119*  --  207* 225*  BUN 40*  < > 34* 32* 32* 30*  --  26* 14  CREATININE 2.55*  < > 2.65* 2.53* 2.11* 2.02*  --  1.80* 1.25*  CALCIUM 7.9*  < > 8.5* 8.4* 8.2* 8.3*  --  8.0* 7.8*  MG 2.3  --  2.1  --   --   --  1.9  --   --   PHOS 2.4*  --  4.4  --   --   --  2.4*  --   --   < > = values in this interval not displayed. Liver Function Tests:  Recent Labs Lab 07/02/15 1840 07/05/15 0732  AST 26 40  ALT 18 26  ALKPHOS 70 86  BILITOT 1.1 1.8*  PROT 6.1* 6.8  ALBUMIN 3.4* 3.5    Recent Labs Lab 07/02/15 1840  LIPASE 17*  AMYLASE 37   No results for input(s): AMMONIA in the last 168 hours. CBC:  Recent Labs Lab 07/02/15 1440 07/02/15 1840 07/03/15 0355 07/05/15 0732 07/05/15 0742 07/05/15 1340 07/06/15 0555  WBC 10.3 8.0 9.8  18.6*  --  15.2* 11.3*  NEUTROABS 9.3* 6.5  --  15.8*  --   --   --   HGB 10.3* 9.2* 9.9* 11.7* 13.6 10.2* 10.8*  HCT 39.2 30.8* 30.0* 37.6* 40.0 30.8* 31.1*  MCV 114.6* 103.0* 89.8 98.4  --  92.2 89.6  PLT 170 148* 140* 171  --  160 163   Cardiac Enzymes:    Recent Labs Lab 07/02/15 1840 07/03/15 0427  TROPONINI 0.03 0.05*   BNP (last 3 results) No results for input(s): BNP in the last 8760 hours.  ProBNP (last 3 results) No results for input(s): PROBNP in the last 8760 hours.  CBG:  Recent Labs Lab 07/06/15 1128 07/06/15 1609 07/06/15 2240 07/07/15 0725 07/07/15 1131  GLUCAP 234* 271* 70 264* 369*    Recent Results (from the past 240 hour(s))  Culture, blood (routine x 2)     Status: None   Collection Time: 07/02/15  3:36 PM  Result Value Ref Range Status   Specimen Description BLOOD LEFT FOREARM  Final   Special Requests BOTTLES DRAWN AEROBIC AND ANAEROBIC 5 CC EA  Final   Culture    Final    NO GROWTH 5 DAYS Performed at [facility]    Report Status 07/07/2015 FINAL  Final  Culture, blood (routine x 2)     Status: None   Collection Time: 07/02/15  3:54 PM  Result Value Ref Range Status   Specimen Description BLOOD RIGHT ARM  Final   Special Requests BOTTLES DRAWN AEROBIC AND ANAEROBIC 5 CC EA  Final   Culture   Final    NO GROWTH 5 DAYS Performed at [facility]    Report Status 07/07/2015 FINAL  Final  Urine culture     Status: None   Collection Time: 07/02/15  5:23 PM  Result Value Ref Range Status   Specimen Description URINE, CLEAN CATCH  Final   Special Requests Normal  Final   Culture   Final    MULTIPLE SPECIES PRESENT, SUGGEST RECOLLECTION Performed at [facility]    Report Status 07/04/2015 FINAL  Final  MRSA PCR Screening     Status: None   Collection Time: 07/02/15  8:30 PM  Result Value Ref Range Status   MRSA by PCR NEGATIVE NEGATIVE Final    Comment:        The GeneXpert MRSA Assay (FDA approved for NASAL specimens only), is one component of a comprehensive MRSA colonization surveillance program. It is not intended to diagnose MRSA infection nor to guide or monitor treatment for MRSA infections.   Blood culture (routine x 2)     Status: None (Preliminary result)   Collection Time: 07/05/15  8:15 AM  Result Value Ref Range Status   Specimen Description BLOOD LEFT ARM  Final   Special Requests BOTTLES DRAWN AEROBIC ONLY 5CC  Final   Culture NO GROWTH 2 DAYS  Final   Report Status PENDING  Incomplete  Blood culture (routine x 2)     Status: None (Preliminary result)   Collection Time: 07/05/15  8:20 AM  Result Value Ref Range Status   Specimen Description BLOOD RIGHT HAND  Final   Special Requests BOTTLES DRAWN AEROBIC AND ANAEROBIC 5CC  Final   Culture NO GROWTH 2 DAYS  Final   Report Status PENDING  Incomplete  Urine culture     Status: None   Collection Time: 07/05/15  9:38 AM  Result Value Ref Range  Status  Specimen Description URINE, RANDOM  Final   Special Requests NONE  Final   Culture NO GROWTH 1 DAY  Final   Report Status 07/06/2015 FINAL  Final  MRSA PCR Screening     Status: None   Collection Time: 07/05/15 10:24 AM  Result Value Ref Range Status   MRSA by PCR NEGATIVE NEGATIVE Final    Comment:        The GeneXpert MRSA Assay (FDA approved for NASAL specimens only), is one component of a comprehensive MRSA colonization surveillance program. It is not intended to diagnose MRSA infection nor to guide or monitor treatment for MRSA infections.      Studies: Mr Herby Abraham Contrast  07/07/2015   CLINICAL DATA:  69 year old male with syncope, confusion, altered mental status, diabetic ketoacidosis. Initial encounter.  EXAM: MRI HEAD WITHOUT CONTRAST  TECHNIQUE: Multiplanar, multiecho pulse sequences of the brain and surrounding structures were obtained without intravenous contrast.  COMPARISON:  Head CT without contrast 07/02/2015. Brain MRI 03/20/2015.  FINDINGS: Study is intermittently degraded by motion artifact despite repeated imaging attempts.  Major intracranial vascular flow voids are stable. No restricted diffusion or evidence of acute infarction. Expected evolution of the small right thalamic lacunar infarct since June.  Pearline Cables and white matter signal appears stable since June. No midline shift, mass effect, evidence of mass lesion, ventriculomegaly, extra-axial collection or acute intracranial hemorrhage. Cervicomedullary junction and pituitary are within normal limits.  Visible internal auditory structures appear normal. Mastoids are clear. Mildly increased fluid level in the left sphenoid sinus. Orbit and scalp soft tissues appear stable.  Skull bone marrow signal is stable and within normal limits. Abnormal bone marrow at C3-C4 re- demonstrated, but stable since June. The visualized cervical spine is stable.  IMPRESSION: No acute intracranial abnormality.   Electronically  Signed   By: Genevie Ann M.D.   On: 07/07/2015 14:01    Scheduled Meds: . amLODipine  5 mg Oral Daily  . aspirin EC  325 mg Oral Daily  . atorvastatin  10 mg Oral q1800  . FLUoxetine  20 mg Oral Daily  . heparin  5,000 Units Subcutaneous 3 times per day  . insulin aspart  0-5 Units Subcutaneous QHS  . insulin aspart  0-9 Units Subcutaneous TID WC  . insulin aspart  5 Units Subcutaneous TID WC  . insulin aspart protamine- aspart  18 Units Subcutaneous BID WC  . potassium chloride  40 mEq Oral Once    Continuous Infusions:    Time spent: 83mins  Xu,Fang MD, PhD  [facility] Pager [phone]. If 7PM-7AM, please contact night-coverage at [email or web address], password [facility] 07/07/2015, 3:09 PM  LOS: 2 days

## 2015-07-08 DIAGNOSIS — Z9119 Patient's noncompliance with other medical treatment and regimen: Secondary | ICD-10-CM

## 2015-07-08 LAB — BASIC METABOLIC PANEL
Anion gap: 7 (ref 5–15)
BUN: 13 mg/dL (ref 6–20)
CO2: 24 mmol/L (ref 22–32)
Calcium: 8 mg/dL — ABNORMAL LOW (ref 8.9–10.3)
Chloride: 102 mmol/L (ref 101–111)
Creatinine, Ser: 1.19 mg/dL (ref 0.61–1.24)
GFR calc Af Amer: >60 mL/min (ref >60)
GFR calc non Af Amer: >60 mL/min (ref >60)
Glucose, Bld: 202 mg/dL — ABNORMAL HIGH (ref 65–99)
Potassium: 3.4 mmol/L — ABNORMAL LOW (ref 3.5–5.1)
Sodium: 133 mmol/L — ABNORMAL LOW (ref 135–145)

## 2015-07-08 LAB — GLUCOSE, CAPILLARY
Glucose-Capillary: 353 mg/dL — ABNORMAL HIGH (ref 65–99)
Glucose-Capillary: 203 mg/dL — ABNORMAL HIGH (ref 65–99)
Glucose-Capillary: 139 mg/dL — ABNORMAL HIGH (ref 65–99)
Glucose-Capillary: 119 mg/dL — ABNORMAL HIGH (ref 65–99)

## 2015-07-08 LAB — TSH: TSH: 2.13 u[IU]/mL (ref 0.350–4.500)

## 2015-07-08 LAB — MAGNESIUM: Magnesium: 1.8 mg/dL (ref 1.7–2.4)

## 2015-07-08 MED ORDER — POTASSIUM CHLORIDE CRYS ER 20 MEQ PO TBCR
40.0000 meq | EXTENDED_RELEASE_TABLET | Freq: Once | ORAL | Status: AC
  Administered 2015-07-08: 40 meq via ORAL

## 2015-07-08 MED ORDER — OXYCODONE HCL 5 MG PO TABS
15.0000 mg | ORAL_TABLET | Freq: Four times a day (QID) | ORAL | Status: DC | PRN
  Administered 2015-07-09 (×2): 15 mg via ORAL
  Filled 2015-07-08 (×2): qty 3

## 2015-07-08 MED ORDER — OXYCODONE HCL 5 MG PO TABS
20.0000 mg | ORAL_TABLET | Freq: Once | ORAL | Status: AC
  Administered 2015-07-08: 20 mg via ORAL
  Filled 2015-07-08: qty 4

## 2015-07-08 NOTE — Evaluation (Signed)
Physical Therapy Evaluation Patient Details Name: [name] [name] MRN: [id number] DOB: Jun 09, 1946 Today's Date: 07/08/2015   History of Present Illness  Patient is a 69 yo male admitted 07/05/15 with hyperglycemia/DKA, weakness, and slurred speech.  MRI of head negative per chart.   PMH:  seizures, DM, HTN, neuropathy, CVA, CKD, depression  Clinical Impression  Patient is functioning at supervision to Mod I level for mobility and gait.  He is unsteady with high level balance activities.  Will benefit from acute PT to address balance prior to discharge home with family.  Do not anticipate any f/u PT needs at discharge.    Follow Up Recommendations No PT follow up;Supervision - Intermittent    Equipment Recommendations  None recommended by PT    Recommendations for Other Services       Precautions / Restrictions Precautions Precautions: Fall Restrictions Weight Bearing Restrictions: No      Mobility  Bed Mobility Overal bed mobility: Independent                Transfers Overall transfer level: Modified independent Equipment used: None             General transfer comment: Increased time with transfers.  Good balance in stance.  Ambulation/Gait Ambulation/Gait assistance: Supervision Ambulation Distance (Feet): 110 Feet Assistive device: None Gait Pattern/deviations: Step-through pattern   Gait velocity interpretation: at or above normal speed for age/gender General Gait Details: Patient with good gait pattern, speed, and balance.  Unsteady with high level balance activities.  Stairs            Wheelchair Mobility    Modified Rankin (Stroke Patients Only)       Balance Overall balance assessment: Needs assistance                           High level balance activites: Direction changes;Turns;Sudden stops;Head turns (Stepping over obstacle) High Level Balance Comments: Patient staggering with head turns to both sides.  Was able to self  correct.  Supervision for safety.             Pertinent Vitals/Pain Pain Assessment: No/denies pain    Home Living Family/patient expects to be discharged to:: Private residence Living Arrangements: Parent;Other relatives (Mother and 2 sisters) Available Help at Discharge: Family;Available 24 hours/day Type of Home: House Home Access: Level entry     Home Layout: One level Home Equipment: None      Prior Function Level of Independence: Independent         Comments: Does not drive     Hand Dominance   Dominant Hand: Right    Extremity/Trunk Assessment   Upper Extremity Assessment: Overall WFL for tasks assessed           Lower Extremity Assessment: Overall WFL for tasks assessed         Communication   Communication: No difficulties  Cognition Arousal/Alertness: Awake/alert Behavior During Therapy: WFL for tasks assessed/performed Overall Cognitive Status: Within Functional Limits for tasks assessed                      General Comments      Exercises        Assessment/Plan    PT Assessment Patient needs continued PT services  PT Diagnosis Abnormality of gait   PT Problem List Decreased balance;Decreased mobility  PT Treatment Interventions Gait training;Functional mobility training;Therapeutic activities;Balance training;Patient/family education   PT Goals (Current goals can be  found in the Care Plan section) Acute Rehab PT Goals Patient Stated Goal: To return home PT Goal Formulation: With patient Time For Goal Achievement: 07/15/15 Potential to Achieve Goals: Good    Frequency Min 3X/week   Barriers to discharge        Co-evaluation               End of Session Equipment Utilized During Treatment: Gait belt Activity Tolerance: Patient tolerated treatment well Patient left: in bed;with call bell/phone within reach Nurse Communication: Mobility status         Time: 1445-1455 PT Time Calculation (min) (ACUTE  ONLY): 10 min   Charges:   PT Evaluation $Initial PT Evaluation Tier I: 1 Procedure     PT G CodesDespina Pole 07-26-15, 6:07 PM Carita Pian. Sanjuana Kava, [place] Pager [phone]

## 2015-07-08 NOTE — Progress Notes (Signed)
Inpatient Diabetes Program Recommendations  AACE/ADA: New Consensus Statement on Inpatient Glycemic Control (2015)  Target Ranges:  Prepandial:   less than 140 mg/dL      Peak postprandial:   less than 180 mg/dL (1-2 hours)      Critically ill patients:  140 - 180 mg/dL   Review of Glycemic Control   Current orders for Inpatient glycemic control: 70/30 insulin 18 units BID, Novolog SENSITIVE correction scale TID & HS, Novolog 5 units TID with meals  Inpatient Diabetes Program Recommendations:  Noted that patient's blood sugar this am was greater than 300 mg/dl. Noted that he only received 70/30 insulin on 9/24 at 1351 and 9/23 at 1706.  Has only received 70/30 insulin once a day.  CBGs trending low in the evening and high in the am.  Recommend slightly increasing AM dose of 70/30 if am, noon, and supper blood sugars continue to be greater than 180 mg/dl. OR the timing of the injections could be before breakfast and HS. Novolog meal coverage is usually not recommended with 70/30 insulin since the 70/30 already acts as a meal coverage along with correction scale. Will continue to monitor blood sugars while in the hospital. Harvel Ricks RN BSN CDE

## 2015-07-08 NOTE — Progress Notes (Signed)
PROGRESS NOTE  [name] [name] [id number] DOB: Feb 09, 1946 DOA: 07/05/2015 PCP: Mauricio Po, [name]  HPI/Recap of past 24 hours:  Blood sugar not well controlled here, reported chronic back pain, overall feel better  Assessment/Plan: Active Problems:   DKA (diabetic ketoacidoses)  DKA: required ICU admission, resolved, transferred to hospitalist service, likely from meds/diet noncompliant,   Insulin dependent DM2, a1c pending, continue adjust insulin dose, appreciate diabetes RN input, patient need ongoing education, continue adjust insulin dose  HTN: stable on home meds.  Hypokalemia: replace k.  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 MRI brain no acute findings. Does has a neurology follow up appointment on 9/28  Per chart patient has history of seizure; patient denies.   FTT: recent multiple hospitalizations, PT/OT,  home health  Code Status: full  Family Communication: patient   Disposition Plan: home with family, likely will need home health due to noncompliant   Consultants:  None ( is transferred from [facility], PCCM signed off)  Procedures:  none  Antibiotics:  none   Objective: BP 148/81 mmHg  Pulse 66  Temp(Src) 98.6 F (37 C) (Oral)  Resp 18  Ht 5\' 9"  (1.753 m)  Wt 174 lb 13.2 oz (79.3 kg)  BMI 25.81 kg/m2  SpO2 100%  Intake/Output Summary (Last 24 hours) at 07/08/15 1700 Last data filed at 07/08/15 1641  Gross per 24 hour  Intake    960 ml  Output   2700 ml  Net  -1740 ml   Filed Weights   07/05/15 1032 07/07/15 2108  Weight: 175 lb 4.3 oz (79.5 kg) 174 lb 13.2 oz (79.3 kg)    Exam:   General:  NAD  Cardiovascular: RRR  Respiratory: CTABL  Abdomen: Soft/ND/NT, positive BS  Musculoskeletal: No Edema  Neuro: slurred speech improving, no obvious focal deficit.  Data Reviewed: Basic Metabolic Panel:  Recent Labs Lab 07/03/15 0355  07/05/15 1141  07/05/15 1956  07/05/15 2120 07/06/15 0555 07/06/15 0826 07/07/15 0526 07/08/15 0407  NA 147*  < > 137  < > 143 142  --  141 133* 133*  K 3.2*  < > 5.3*  < > 5.6* 4.1  --  4.0 3.3* 3.4*  CL 118*  < > 105  < > 117* 114*  --  114* 104 102  CO2 19*  < > 8*  < > 16* 22  --  20* 23 24  GLUCOSE 294*  < > 642*  < > 95 119*  --  207* 225* 202*  BUN 40*  < > 34*  < > 32* 30*  --  26* 14 13  CREATININE 2.55*  < > 2.65*  < > 2.11* 2.02*  --  1.80* 1.25* 1.19  CALCIUM 7.9*  < > 8.5*  < > 8.2* 8.3*  --  8.0* 7.8* 8.0*  MG 2.3  --  2.1  --   --   --  1.9  --   --  1.8  PHOS 2.4*  --  4.4  --   --   --  2.4*  --   --   --   < > = values in this interval not displayed. Liver Function Tests:  Recent Labs Lab 07/02/15 1840 07/05/15 0732  AST 26 40  ALT 18 26  ALKPHOS 70 86  BILITOT 1.1 1.8*  PROT 6.1* 6.8  ALBUMIN 3.4* 3.5    Recent Labs Lab 07/02/15 1840  LIPASE 17*  AMYLASE 37   No results for input(s): AMMONIA in the last 168 hours. CBC:  Recent Labs Lab 07/02/15 1440 07/02/15 1840 07/03/15 0355 07/05/15 0732 07/05/15 0742 07/05/15 1340 07/06/15 0555  WBC 10.3 8.0 9.8 18.6*  --  15.2* 11.3*  NEUTROABS 9.3* 6.5  --  15.8*  --   --   --   HGB 10.3* 9.2* 9.9* 11.7* 13.6 10.2* 10.8*  HCT 39.2 30.8* 30.0* 37.6* 40.0 30.8* 31.1*  MCV 114.6* 103.0* 89.8 98.4  --  92.2 89.6  PLT 170 148* 140* 171  --  160 163   Cardiac Enzymes:    Recent Labs Lab 07/02/15 1840 07/03/15 0427  TROPONINI 0.03 0.05*   BNP (last 3 results) No results for input(s): BNP in the last 8760 hours.  ProBNP (last 3 results) No results for input(s): PROBNP in the last 8760 hours.  CBG:  Recent Labs Lab 07/07/15 1655 07/07/15 2106 07/08/15 0744 07/08/15 1146 07/08/15 1640  GLUCAP 190* 79 353* 203* 139*    Recent Results (from the past 240 hour(s))  Culture, blood (routine x 2)     Status: None   Collection Time: 07/02/15  3:36 PM  Result Value Ref Range Status   Specimen Description BLOOD LEFT  FOREARM  Final   Special Requests BOTTLES DRAWN AEROBIC AND ANAEROBIC 5 CC EA  Final   Culture   Final    NO GROWTH 5 DAYS Performed at [facility]    Report Status 07/07/2015 FINAL  Final  Culture, blood (routine x 2)     Status: None   Collection Time: 07/02/15  3:54 PM  Result Value Ref Range Status   Specimen Description BLOOD RIGHT ARM  Final   Special Requests BOTTLES DRAWN AEROBIC AND ANAEROBIC 5 CC EA  Final   Culture   Final    NO GROWTH 5 DAYS Performed at [facility]    Report Status 07/07/2015 FINAL  Final  Urine culture     Status: None   Collection Time: 07/02/15  5:23 PM  Result Value Ref Range Status   Specimen Description URINE, CLEAN CATCH  Final   Special Requests Normal  Final   Culture   Final    MULTIPLE SPECIES PRESENT, SUGGEST RECOLLECTION Performed at [facility]    Report Status 07/04/2015 FINAL  Final  MRSA PCR Screening     Status: None   Collection Time: 07/02/15  8:30 PM  Result Value Ref Range Status   MRSA by PCR NEGATIVE NEGATIVE Final    Comment:        The GeneXpert MRSA Assay (FDA approved for NASAL specimens only), is one component of a comprehensive MRSA colonization surveillance program. It is not intended to diagnose MRSA infection nor to guide or monitor treatment for MRSA infections.   Blood culture (routine x 2)     Status: None (Preliminary result)   Collection Time: 07/05/15  8:15 AM  Result Value Ref Range Status   Specimen Description BLOOD LEFT ARM  Final   Special Requests BOTTLES DRAWN AEROBIC ONLY 5CC  Final   Culture NO GROWTH 3 DAYS  Final   Report Status PENDING  Incomplete  Blood culture (routine x 2)     Status: None (Preliminary result)   Collection Time: 07/05/15  8:20 AM  Result Value Ref Range Status   Specimen Description BLOOD RIGHT HAND  Final   Special Requests BOTTLES DRAWN AEROBIC AND ANAEROBIC 5CC  Final   Culture  NO GROWTH 3 DAYS  Final   Report Status PENDING  Incomplete   Urine culture     Status: None   Collection Time: 07/05/15  9:38 AM  Result Value Ref Range Status   Specimen Description URINE, RANDOM  Final   Special Requests NONE  Final   Culture NO GROWTH 1 DAY  Final   Report Status 07/06/2015 FINAL  Final  MRSA PCR Screening     Status: None   Collection Time: 07/05/15 10:24 AM  Result Value Ref Range Status   MRSA by PCR NEGATIVE NEGATIVE Final    Comment:        The GeneXpert MRSA Assay (FDA approved for NASAL specimens only), is one component of a comprehensive MRSA colonization surveillance program. It is not intended to diagnose MRSA infection nor to guide or monitor treatment for MRSA infections.      Studies: No results found.  Scheduled Meds: . amLODipine  5 mg Oral Daily  . aspirin EC  325 mg Oral Daily  . atorvastatin  10 mg Oral q1800  . FLUoxetine  20 mg Oral Daily  . heparin  5,000 Units Subcutaneous 3 times per day  . insulin aspart  0-5 Units Subcutaneous QHS  . insulin aspart  0-9 Units Subcutaneous TID WC  . insulin aspart  5 Units Subcutaneous TID WC  . insulin aspart protamine- aspart  18 Units Subcutaneous BID WC    Continuous Infusions:    Time spent: 68mins  Xu,Fang MD, PhD  [facility] Pager [phone]. If 7PM-7AM, please contact night-coverage at [email or web address], password [email or web address] 07/08/2015, 5:00 PM  LOS: 3 days

## 2015-07-08 NOTE — Care Management Note (Signed)
Case Management Note  Patient Details  Name: Rodrigo Mcgranahan MRN: [id number] Date of Birth: 1946-10-11  Subjective/Objective:    DKA, non compliance                Action/Plan:   CM met with patient at bedside to discussion transitional care post discharge, confirmed information patient lives alone, but upon discharge he will be staying with his mother. Discussed the patient understanding of disease management. Patient states, that he has been managing his diabetes for decades, CM discussed the recommendation for [facility] RN services for Medication Management, patient is agreeable. Offered choice, from [facility] list, patient selected [facility]. Referral called in to [facility], confirmed temp address where patient will be receiving care [address] [place], [place] [phone]. No further CM needs identified.  Expected Discharge Date:  07/07/15               Expected Discharge Plan:  [place]  In-House Referral:   Diabetic Coordinator  Discharge planning Services  CM Consult  Post Acute Care Choice:      Choice offered to:   Patient  DME Arranged:    DME Agency:     HH Arranged:  RN [place] Agency:  [place]  Status of Service:  Completed, signed off  Medicare Important Message Given:    Date Medicare IM Given:    Medicare IM give by:    Date Additional Medicare IM Given:    Additional Medicare Important Message give by:     If discussed at [place] of Stay Meetings, dates discussed:    Additional CommentsLaurena Slimmer, RN 07/08/2015, 12:14 PM

## 2015-07-09 ENCOUNTER — Inpatient Hospital Stay: Payer: Medicare HMO | Admitting: Family

## 2015-07-09 DIAGNOSIS — N179 Acute kidney failure, unspecified: Secondary | ICD-10-CM

## 2015-07-09 LAB — BASIC METABOLIC PANEL
Anion gap: 7 (ref 5–15)
BUN: 13 mg/dL (ref 6–20)
CO2: 25 mmol/L (ref 22–32)
Calcium: 8.3 mg/dL — ABNORMAL LOW (ref 8.9–10.3)
Chloride: 105 mmol/L (ref 101–111)
Creatinine, Ser: 1.1 mg/dL (ref 0.61–1.24)
GFR calc Af Amer: >60 mL/min (ref >60)
GFR calc non Af Amer: >60 mL/min (ref >60)
Glucose, Bld: 66 mg/dL (ref 65–99)
Potassium: 3.5 mmol/L (ref 3.5–5.1)
Sodium: 137 mmol/L (ref 135–145)

## 2015-07-09 LAB — CBC
HCT: 30.1 % — ABNORMAL LOW (ref 39.0–52.0)
Hemoglobin: 10.2 g/dL — ABNORMAL LOW (ref 13.0–17.0)
MCH: 29.8 pg (ref 26.0–34.0)
MCHC: 33.9 g/dL (ref 30.0–36.0)
MCV: 88 fL (ref 78.0–100.0)
Platelets: 173 10*3/uL (ref 150–400)
RBC: 3.42 MIL/uL — ABNORMAL LOW (ref 4.22–5.81)
RDW: 13.3 % (ref 11.5–15.5)
WBC: 8.8 10*3/uL (ref 4.0–10.5)

## 2015-07-09 LAB — MAGNESIUM: Magnesium: 1.9 mg/dL (ref 1.7–2.4)

## 2015-07-09 LAB — GLUCOSE, CAPILLARY
Glucose-Capillary: 107 mg/dL — ABNORMAL HIGH (ref 65–99)
Glucose-Capillary: 142 mg/dL — ABNORMAL HIGH (ref 65–99)

## 2015-07-09 LAB — HEMOGLOBIN A1C
Hgb A1c MFr Bld: 12.5 % — ABNORMAL HIGH (ref 4.8–5.6)
Mean Plasma Glucose: 312 mg/dL

## 2015-07-09 NOTE — Progress Notes (Signed)
Pt left floor via wheelchair accompanied by staff and family. 

## 2015-07-09 NOTE — Progress Notes (Signed)
Pt discharge instructions given, pt verbalized understanding.  Pt stated when he was transferred from [facility] and they did not bring his dentures, billfold, or clothes with him.  Called [facility], they did not have any of his belongings on the floor and did not know the location of his belongings.  Pt was transferred from [facility] and pt stated he had his belongings in [facility].  Advised Lorenso Quarry, director of [facility] of patients missing belongings.  Pt waiting for family members to bring clothes and transportation home.

## 2015-07-09 NOTE — Discharge Summary (Signed)
Discharge Summary  [name] [name] [id number] DOB: [birth year]  PCP: Mauricio Po, [name]  Admit date: 07/05/2015 Discharge date: 07/09/2015  Time spent: <19mns  Recommendations for Outpatient Follow-up:  1. F/u with PMD within a week, pmd to continue monitor blood sugar control, pmd to repeat bmp at follow up.  Discharge Diagnoses:  Active Hospital Problems   Diagnosis Date Noted  . DKA (diabetic ketoacidoses) 07/02/2015    Resolved Hospital Problems   Diagnosis Date Noted Date Resolved  No resolved problems to display.    Discharge Condition: stable  Diet recommendation: heart healthy/carb modified  Filed Weights   07/05/15 1032 07/07/15 2108 07/08/15 2031  Weight: 175 lb 4.3 oz (79.5 kg) 174 lb 13.2 oz (79.3 kg) 173 lb 12.8 oz (78.835 kg)    History of present illness:  Mr HDinovowas just discharged from the hospital for DKA after hospital stay from 9/19 and 9/21 under critical care service, he came back to the hospital on 9/22  After he had a fight with family, and doesn't feel like they support him. He wants to get new place to live. He didn't take his insulin. He started feeling short of breath and came to ER. He was found to have hyperglycemia and anion gap. 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  Hypokalemia: replace k. Mag wnl.  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 MRI brain no acute findings. Does has a neurology follow up appointment on 9/28  Per chart patient has history of seizure; patient denies.   FTT: recent multiple hospitalizations, PT/OT, home health RN  Code Status: full  Family Communication: patient   Disposition Plan: home with family, home health due to noncompliant   Consultants:  None ( is transferred from PCCM, PCCM signed off)  Procedures:  none  Antibiotics:  none   Discharge Exam: BP 144/70 mmHg  Pulse 54  Temp(Src) 98.7 F (37.1 C) (Oral)  Resp 18  Ht _0  (1.753 m)  Wt 173 lb 12.8 oz (78.835 kg)  BMI 25.65 kg/m2  SpO2 99%   General: NAD  Cardiovascular: RRR  Respiratory: CTABL  Abdomen: Soft/ND/NT, positive BS  Musculoskeletal: No Edema  Neuro: slurred speech improving, no obvious focal deficit    Discharge Instructions You were cared for by a hospitalist during your hospital stay. If you have any questions about your discharge medications or the care you received while you were in the hospital after you are discharged, you can call the unit and asked to speak with the hospitalist on call if the hospitalist that took care of you is not available. Once you are discharged, your primary care physician will handle any further medical issues. Please note that NO REFILLS for any discharge medications will be authorized once you are discharged, as it is imperative that you return to your primary care physician (or establish a relationship with a primary care physician if  you do not have one) for your aftercare needs so that they can reassess your need for medications and monitor your lab values.  Discharge Instructions    Diet - low sodium heart healthy    Complete by:  As directed      Face-to-face encounter (required for Medicare/Medicaid patients)    Complete by:  As directed   I Xu,Fang certify that this patient is under my care and that I, or a nurse  practitioner or physician's assistant working with me, had a face-to-face encounter that meets the physician face-to-face encounter requirements with this patient on 07/08/2015. The encounter with the patient was in whole, or in part for the following medical condition(s) which is the primary reason for home health care (List medical condition): FTT, medication noncompliance, poor controlled insulin  Dependent diabetes.  The encounter with the patient was in whole, or in part, for the following medical condition, which is the primary reason for home health care:  FTT, poor controlled diabetes, noncompliance  I certify that, based on my findings, the following services are medically necessary home health services:  Nursing  Reason for Medically Necessary Home Health Services:  Skilled Nursing- Change/Decline in Patient Status  My clinical findings support the need for the above services:  OTHER SEE COMMENTS  Further, I certify that my clinical findings support that this patient is homebound due to:  Pain interferes with ambulation/mobility     Home Health    Complete by:  As directed   To provide the following care/treatments:  RN     Increase activity slowly    Complete by:  As directed             Medication List    TAKE these medications        ALPRAZolam 1 MG tablet  Commonly known as:  XANAX  Take 1 tablet (1 mg total) by mouth 2 (two) times daily as needed for anxiety.     amLODipine 5 MG tablet  Commonly known as:  NORVASC  Take 1 tablet (5 mg total) by mouth daily.     aspirin EC 325 MG tablet  Take 1 tablet (325 mg total) by mouth daily.     atorvastatin 10 MG tablet  Commonly known as:  LIPITOR  Take 1 tablet (10 mg total) by mouth daily at 6 PM.     blood glucose meter kit and supplies  Dispense based on patient and insurance preference. Use up to four times daily as directed. (FOR ICD-9 250.00, 250.01).     FLUoxetine 20 MG capsule  Commonly known as:  PROZAC  Take  20 mg by mouth daily.     folic acid 1 MG tablet  Commonly known as:  FOLVITE  Take 1 mg by mouth daily.     insulin aspart protamine- aspart (70-30) 100 UNIT/ML injection  Commonly known as:  NOVOLOG MIX 70/30  Inject 0.18 mLs (18 Units total) into the skin 2 (two) times daily.     Oxycodone HCl 20 MG Tabs  Take 20 mg by mouth 4 (four) times daily.       No Known Allergies     Follow-up Information    Follow up with [place].   Why:  Referral for [facility] RN Medication management. Explained [facility] will contact patient 24-48 hours post discharge to arrange initial visit   Contact information:   [address] [place] [address] [phone]  Follow up with Mauricio Po, [name] In 1 week.   Specialty:  Family Medicine   Why:  hospital discharge follow up, pmd to continue monitor blood sugar control   Contact information:   [place] [phone]        The results of significant diagnostics from this hospitalization (including imaging, microbiology, ancillary and laboratory) are listed below for reference.    Significant Diagnostic Studies: Dg Chest 2 View  07/02/2015   CLINICAL DATA:  Confusion, fever.  Hypertension, diabetes  EXAM: CHEST  2 VIEW  COMPARISON:  03/20/2015  FINDINGS: The heart size and mediastinal contours are within normal limits. Both lungs are clear. The visualized skeletal structures are unremarkable.  IMPRESSION: No active cardiopulmonary disease.   Electronically Signed   By: Rolm Baptise M.D.   On: 07/02/2015 15:45   Ct Head Wo Contrast  07/02/2015   CLINICAL DATA:  Confused and disoriented, uncertain what happened to patient over past week, hallucinations, hypertension, diabetes mellitus, renal failure  EXAM: CT HEAD WITHOUT CONTRAST  TECHNIQUE: Contiguous axial images were obtained from the base of the skull through the vertex without intravenous contrast.  COMPARISON:  03/19/2015  FINDINGS: Mild  generalized atrophy.  Normal ventricular morphology.  No midline shift or mass effect.  Mild small vessel chronic ischemic changes of deep cerebral white matter.  No intracranial hemorrhage, mass lesion or evidence acute infarction.  No extra-axial fluid collections.  Small air-fluid level LEFT maxillary sinus.  Atherosclerotic calcifications of the carotid siphons.  Osseous structures, remaining visualized sinuses and mastoid air cells otherwise unremarkable.  IMPRESSION: Atrophy with small vessel chronic ischemic changes of deep cerebral white matter.  No acute intracranial abnormalities.  Small chronic air-fluid level in the LEFT maxillary sinus.   Electronically Signed   By: Lavonia Dana M.D.   On: 07/02/2015 19:38   Mr Brain Wo Contrast  07/07/2015   CLINICAL DATA:  69 year old male with syncope, confusion, altered mental status, diabetic ketoacidosis. Initial encounter.  EXAM: MRI HEAD WITHOUT CONTRAST  TECHNIQUE: Multiplanar, multiecho pulse sequences of the brain and surrounding structures were obtained without intravenous contrast.  COMPARISON:  Head CT without contrast 07/02/2015. Brain MRI 03/20/2015.  FINDINGS: Study is intermittently degraded by motion artifact despite repeated imaging attempts.  Major intracranial vascular flow voids are stable. No restricted diffusion or evidence of acute infarction. Expected evolution of the small right thalamic lacunar infarct since June.  Pearline Cables and white matter signal appears stable since June. No midline shift, mass effect, evidence of mass lesion, ventriculomegaly, extra-axial collection or acute intracranial hemorrhage. Cervicomedullary junction and pituitary are within normal limits.  Visible internal auditory structures appear normal. Mastoids are clear. Mildly increased fluid level in the left sphenoid sinus. Orbit and scalp soft tissues appear stable.  Skull bone marrow signal is stable and within normal limits. Abnormal bone marrow at C3-C4 re-  demonstrated, but stable since June. The visualized cervical spine is stable.  IMPRESSION: No acute intracranial abnormality.   Electronically Signed   By: Genevie Ann M.D.   On: 07/07/2015 14:01   US Renal  07/02/2015   CLINICAL DATA:  Acute renal failure, elevated BUN and creatinine, diabetes mellitus, hypertension  EXAM: RENAL / URINARY TRACT ULTRASOUND COMPLETE  COMPARISON:  03/20/2015  FINDINGS: Right Kidney:  Length: 10.5 cm. Normal cortical thickness. Upper normal echogenicity. No mass, hydronephrosis or shadowing calcification.  Left Kidney:  Length: 10.7 cm. Normal cortical thickness. Upper normal cortical echogenicity. No mass, hydronephrosis  or shadowing calcification. Tiny cyst seen on previous exam not well delineated on current study.  Bladder:  Appears normal for degree of bladder distention.  IMPRESSION: No evidence of renal mass or hydronephrosis.   Electronically Signed   By: Lavonia Dana M.D.   On: 07/02/2015 20:05   Dg Chest Port 1 View  07/05/2015   CLINICAL DATA:  Shortness of breath for 2 days with right leg swelling. Confusion and tachycardia. History of hypertension, diabetes, stroke and chronic kidney disease. Initial encounter.  EXAM: PORTABLE CHEST - 1 VIEW  COMPARISON:  Radiographs 07/02/2015 and 03/20/2015.  FINDINGS: 0831 hours. The heart size and mediastinal contours are stable. There is improved aeration of the left lung base. The lungs are clear. There is no pleural effusion or pneumothorax. The bones appear unremarkable. Telemetry leads overlie the chest.  IMPRESSION: No active cardiopulmonary process.   Electronically Signed   By: Richardean Sale M.D.   On: 07/05/2015 08:41    Microbiology: Recent Results (from the past 240 hour(s))  Culture, blood (routine x 2)     Status: None   Collection Time: 07/02/15  3:36 PM  Result Value Ref Range Status   Specimen Description BLOOD LEFT FOREARM  Final   Special Requests BOTTLES DRAWN AEROBIC AND ANAEROBIC 5 CC EA  Final    Culture   Final    NO GROWTH 5 DAYS Performed at [facility]    Report Status 07/07/2015 FINAL  Final  Culture, blood (routine x 2)     Status: None   Collection Time: 07/02/15  3:54 PM  Result Value Ref Range Status   Specimen Description BLOOD RIGHT ARM  Final   Special Requests BOTTLES DRAWN AEROBIC AND ANAEROBIC 5 CC EA  Final   Culture   Final    NO GROWTH 5 DAYS Performed at [facility]    Report Status 07/07/2015 FINAL  Final  Urine culture     Status: None   Collection Time: 07/02/15  5:23 PM  Result Value Ref Range Status   Specimen Description URINE, CLEAN CATCH  Final   Special Requests Normal  Final   Culture   Final    MULTIPLE SPECIES PRESENT, SUGGEST RECOLLECTION Performed at [facility]    Report Status 07/04/2015 FINAL  Final  MRSA PCR Screening     Status: None   Collection Time: 07/02/15  8:30 PM  Result Value Ref Range Status   MRSA by PCR NEGATIVE NEGATIVE Final    Comment:        The GeneXpert MRSA Assay (FDA approved for NASAL specimens only), is one component of a comprehensive MRSA colonization surveillance program. It is not intended to diagnose MRSA infection nor to guide or monitor treatment for MRSA infections.   Blood culture (routine x 2)     Status: None (Preliminary result)   Collection Time: 07/05/15  8:15 AM  Result Value Ref Range Status   Specimen Description BLOOD LEFT ARM  Final   Special Requests BOTTLES DRAWN AEROBIC ONLY 5CC  Final   Culture NO GROWTH 3 DAYS  Final   Report Status PENDING  Incomplete  Blood culture (routine x 2)     Status: None (Preliminary result)   Collection Time: 07/05/15  8:20 AM  Result Value Ref Range Status   Specimen Description BLOOD RIGHT HAND  Final   Special Requests BOTTLES DRAWN AEROBIC AND ANAEROBIC 5CC  Final   Culture NO GROWTH 3 DAYS  Final  Report Status PENDING  Incomplete  Urine culture     Status: None   Collection Time: 07/05/15  9:38 AM  Result Value  Ref Range Status   Specimen Description URINE, RANDOM  Final   Special Requests NONE  Final   Culture NO GROWTH 1 DAY  Final   Report Status 07/06/2015 FINAL  Final  MRSA PCR Screening     Status: None   Collection Time: 07/05/15 10:24 AM  Result Value Ref Range Status   MRSA by PCR NEGATIVE NEGATIVE Final    Comment:        The GeneXpert MRSA Assay (FDA approved for NASAL specimens only), is one component of a comprehensive MRSA colonization surveillance program. It is not intended to diagnose MRSA infection nor to guide or monitor treatment for MRSA infections.      Labs: Basic Metabolic Panel:  Recent Labs Lab 07/03/15 0355  07/05/15 1141  07/05/15 2120 07/06/15 0555 07/06/15 0826 07/07/15 0526 07/08/15 0407 07/09/15 0408  NA 147*  < > 137  < > 142  --  141 133* 133* 137  K 3.2*  < > 5.3*  < > 4.1  --  4.0 3.3* 3.4* 3.5  CL 118*  < > 105  < > 114*  --  114* 104 102 105  CO2 19*  < > 8*  < > 22  --  20* _0 GLUCOSE 294*  < > 642*  < > 119*  --  207* 225* 202* 66  BUN 40*  < > 34*  < > 30*  --  26* _1 CREATININE 2.55*  < > 2.65*  < > 2.02*  --  1.80* 1.25* 1.19 1.10  CALCIUM 7.9*  < > 8.5*  < > 8.3*  --  8.0* 7.8* 8.0* 8.3*  MG 2.3  --  2.1  --   --  1.9  --   --  1.8 1.9  PHOS 2.4*  --  4.4  --   --  2.4*  --   --   --   --   < > = values in this interval not displayed. Liver Function Tests:  Recent Labs Lab 07/02/15 1840 07/05/15 0732  AST 26 40  ALT 18 26  ALKPHOS 70 86  BILITOT 1.1 1.8*  PROT 6.1* 6.8  ALBUMIN 3.4* 3.5    Recent Labs Lab 07/02/15 1840  LIPASE 17*  AMYLASE 37   No results for input(s): AMMONIA in the last 168 hours. CBC:  Recent Labs Lab 07/02/15 1440 07/02/15 1840 07/03/15 0355 07/05/15 0732 07/05/15 0742 07/05/15 1340 07/06/15 0555 07/09/15 0408  WBC 10.3 8.0 9.8 18.6*  --  15.2* 11.3* 8.8  NEUTROABS 9.3* 6.5  --  15.8*  --   --   --   --   HGB 10.3* 9.2* 9.9* 11.7* 13.6 10.2* 10.8* 10.2*  HCT 39.2  30.8* 30.0* 37.6* 40.0 30.8* 31.1* 30.1*  MCV 114.6* 103.0* 89.8 98.4  --  92.2 89.6 88.0  PLT 170 148* 140* 171  --  160 163 173   Cardiac Enzymes:  Recent Labs Lab 07/02/15 1840 07/03/15 0427  TROPONINI 0.03 0.05*   BNP: BNP (last 3 results) No results for input(s): BNP in the last 8760 hours.  ProBNP (last 3 results) No results for input(s): PROBNP in the last 8760 hours.  CBG:  Recent Labs Lab 07/08/15 0744 07/08/15 1146 07/08/15 1640 07/08/15 2030 07/09/15 0726  GLUCAP 353* 203*  139* 119* 107*       Signed:  Xu,Fang MD, PhD  [facility] 07/09/2015, 8:36 AM

## 2015-07-09 NOTE — Care Management Important Message (Signed)
Important Message  Patient Details  Name: [name] [name] MRN: [id number] Date of Birth: Sep 21, 1946   Medicare Important Message Given:  Yes-second notification given    Delorse Lek 07/09/2015, 3:32 PM

## 2015-07-09 NOTE — Progress Notes (Signed)
OT Cancellation Note  Patient Details Name: [name] [name] MRN: [id number] DOB: 10/07/1946   Cancelled Treatment:    Reason Eval/Treat Not Completed: Other (comment). Pt has been D/C'd and has already left.  Almon Register [id number] 07/09/2015, 1:04 PM

## 2015-07-09 NOTE — Plan of Care (Signed)
Problem: Food- and Nutrition-Related Knowledge Deficit (NB-1.1) Goal: Nutrition education Formal process to instruct or train a patient/client in a skill or to impart knowledge to help patients/clients voluntarily manage or modify food choices and eating behavior to maintain or improve health. Outcome: Adequate for Discharge  RD consulted for nutrition education regarding diabetes.     Lab Results  Component Value Date    HGBA1C 10.6* 03/21/2015   Pt reports feeling well today; ate all of his breakfast. He denies poor appetite and weight loss. He reveals that his largest barrier to DM control is not taking insulin on schedule due to his work schedule. Typical CBGS are between 200-250 per his report, revealing that they have been much high in the past. PTA diet recall is as follows: 2 meals daily, which include a meats, starch, and vegetable. Pt denies eating out and prepares most of his own meals. Beverage of choice is water. He confirms that he will be staying with family at discharge- encouraged him to share DM diet information with family.   RD provided "Plate Method" handout. Discussed different food groups and their effects on blood sugar, emphasizing carbohydrate-containing foods. Provided list of carbohydrates and recommended serving sizes of common foods.  Discussed importance of controlled and consistent carbohydrate intake throughout the day. Provided examples of ways to balance meals/snacks and encouraged intake of high-fiber, whole grain complex carbohydrates. Teach back method used.  Expect fair compliance.  Body mass index is 25.65 kg/(m^2). Pt meets criteria for overweight based on current BMI.  Current diet order is Carb Modified, patient is consuming approximately 100% of meals at this time. Labs and medications reviewed. No further nutrition interventions warranted at this time. RD contact information provided. If additional nutrition issues arise, please re-consult  RD.  Anahlia Iseminger A. Jimmye Norman, RD, LDN, CDE Pager: [phone] After hours Pager: [phone]

## 2015-07-10 LAB — CULTURE, BLOOD (ROUTINE X 2)
Culture: NO GROWTH
Culture: NO GROWTH

## 2015-07-11 ENCOUNTER — Telehealth: Payer: Self-pay | Admitting: *Deleted

## 2015-07-11 ENCOUNTER — Telehealth: Payer: Self-pay | Admitting: Family

## 2015-07-11 NOTE — Telephone Encounter (Signed)
Tried calling pt to set-up TCM appt no answer x's 10 rings...[name] [name]

## 2015-07-11 NOTE — Telephone Encounter (Signed)
Do not charge for no show as I think he was still in the hospital the day of the no show.

## 2015-07-11 NOTE — Telephone Encounter (Signed)
Patient no showed for hospital fu 9/26.  Please advise.

## 2015-07-12 ENCOUNTER — Telehealth: Payer: Self-pay | Admitting: *Deleted

## 2015-07-12 NOTE — Telephone Encounter (Signed)
Been trying to reach pt since 07/10/15 to set up TCM appt. Couldn't leave msg due to no vm. Received call from case manager [name] [name] stating that the pt is needing glucose monitor. He was d/c from hosp on Monday for DKA. Inform [name] [name] that we have been trying to contact pt but wasn't successful. Since she was there with pt we completed TCM below..../lmb   Transition Care Management Follow-up Telephone Call   Date discharged? 07/09/15   How have you been since you were released from the hospital? Pt states he is doing ok   Do you understand why you were in the hospital? YES   Do you understand the discharge instructions? YES   Where were you discharged to? Home   Items Reviewed:  Medications reviewed: YES  Allergies reviewed: YES  Dietary changes reviewed: YES, heart healthy & modified carbs  Referrals reviewed: No referral was needed   Functional Questionnaire:   Activities of Daily Living (ADLs):   He states he are independent in the following: ambulation, bathing and hygiene, feeding, continence, grooming, toileting and dressing States he doesn't require assistance    Any transportation issues/concerns?: NO   Any patient concerns? YES, pt c/o pain and wanting [name] [name] to Rx some oxycodone. Inform before he can rx pain med will have to see him for hosp f/u   Confirmed importance and date/time of follow-up visits scheduled YES., made 07/13/15  Provider Appointment booked with [name] [name]  Confirmed with patient if condition begins to worsen call PCP or go to the ER.  Patient was given the office number and encouraged to call back with question or concerns.  : YES

## 2015-07-13 ENCOUNTER — Inpatient Hospital Stay: Payer: Medicare HMO | Admitting: Family

## 2015-07-13 ENCOUNTER — Telehealth: Payer: Self-pay | Admitting: Family

## 2015-07-13 NOTE — Telephone Encounter (Signed)
Would like to know if she can pick up a meter for patient.  States patient has not been able to check blood sugar.  Would like a call back before 4pm if she is able to come pick one up in order to give her time to get her before we close.

## 2015-07-13 NOTE — Telephone Encounter (Signed)
Meter ready for pick up. Called Anderson Malta to let her know.

## 2015-07-16 ENCOUNTER — Inpatient Hospital Stay: Payer: Medicare HMO | Admitting: Family

## 2015-07-16 DIAGNOSIS — Z0289 Encounter for other administrative examinations: Secondary | ICD-10-CM

## 2015-07-17 ENCOUNTER — Telehealth: Payer: Self-pay | Admitting: Family

## 2015-07-17 NOTE — Telephone Encounter (Signed)
Patient no showed for hospital fu 10/3.  Please advise.

## 2015-07-18 NOTE — Telephone Encounter (Signed)
Ok to reschedule if he calls back 

## 2015-08-02 ENCOUNTER — Telehealth: Payer: Self-pay | Admitting: Family

## 2015-08-02 NOTE — Telephone Encounter (Signed)
Discharging patient b/c patient refusing to stay home for visits.  Patient has only had one visit while under services.  Will call patient the day before to confirm and when the appointment time comes patient leaves his house.

## 2015-08-02 NOTE — Telephone Encounter (Signed)
Noted! Thank you

## 2015-08-07 ENCOUNTER — Ambulatory Visit: Payer: Medicare HMO | Admitting: Family

## 2015-08-07 ENCOUNTER — Inpatient Hospital Stay (HOSPITAL_COMMUNITY)
Admission: EM | Admit: 2015-08-07 | Discharge: 2015-08-10 | DRG: 206 | Disposition: A | Discharge status: Home Health | Payer: Medicare HMO | Attending: Internal Medicine | Admitting: Internal Medicine

## 2015-08-07 ENCOUNTER — Emergency Department (HOSPITAL_COMMUNITY): Payer: Medicare HMO

## 2015-08-07 ENCOUNTER — Encounter (HOSPITAL_COMMUNITY): Payer: Self-pay | Admitting: Emergency Medicine

## 2015-08-07 DIAGNOSIS — W010XXA Fall on same level from slipping, tripping and stumbling without subsequent striking against object, initial encounter: Secondary | ICD-10-CM | POA: Diagnosis present

## 2015-08-07 DIAGNOSIS — F17210 Nicotine dependence, cigarettes, uncomplicated: Secondary | ICD-10-CM | POA: Diagnosis present

## 2015-08-07 DIAGNOSIS — Z8673 Personal history of transient ischemic attack (TIA), and cerebral infarction without residual deficits: Secondary | ICD-10-CM | POA: Diagnosis not present

## 2015-08-07 DIAGNOSIS — Y92009 Unspecified place in unspecified non-institutional (private) residence as the place of occurrence of the external cause: Secondary | ICD-10-CM | POA: Diagnosis not present

## 2015-08-07 DIAGNOSIS — Z794 Long term (current) use of insulin: Secondary | ICD-10-CM | POA: Diagnosis not present

## 2015-08-07 DIAGNOSIS — Z820 Family history of epilepsy and other diseases of the nervous system: Secondary | ICD-10-CM | POA: Diagnosis not present

## 2015-08-07 DIAGNOSIS — Z96641 Presence of right artificial hip joint: Secondary | ICD-10-CM | POA: Diagnosis present

## 2015-08-07 DIAGNOSIS — R739 Hyperglycemia, unspecified: Secondary | ICD-10-CM

## 2015-08-07 DIAGNOSIS — Z79891 Long term (current) use of opiate analgesic: Secondary | ICD-10-CM | POA: Diagnosis not present

## 2015-08-07 DIAGNOSIS — Z8249 Family history of ischemic heart disease and other diseases of the circulatory system: Secondary | ICD-10-CM

## 2015-08-07 DIAGNOSIS — Z833 Family history of diabetes mellitus: Secondary | ICD-10-CM

## 2015-08-07 DIAGNOSIS — N189 Chronic kidney disease, unspecified: Secondary | ICD-10-CM | POA: Diagnosis present

## 2015-08-07 DIAGNOSIS — E1159 Type 2 diabetes mellitus with other circulatory complications: Secondary | ICD-10-CM | POA: Diagnosis not present

## 2015-08-07 DIAGNOSIS — W19XXXA Unspecified fall, initial encounter: Secondary | ICD-10-CM

## 2015-08-07 DIAGNOSIS — N179 Acute kidney failure, unspecified: Secondary | ICD-10-CM | POA: Diagnosis present

## 2015-08-07 DIAGNOSIS — Z79899 Other long term (current) drug therapy: Secondary | ICD-10-CM

## 2015-08-07 DIAGNOSIS — E785 Hyperlipidemia, unspecified: Secondary | ICD-10-CM | POA: Diagnosis present

## 2015-08-07 DIAGNOSIS — E1165 Type 2 diabetes mellitus with hyperglycemia: Secondary | ICD-10-CM | POA: Diagnosis present

## 2015-08-07 DIAGNOSIS — E1122 Type 2 diabetes mellitus with diabetic chronic kidney disease: Secondary | ICD-10-CM | POA: Diagnosis present

## 2015-08-07 DIAGNOSIS — E1140 Type 2 diabetes mellitus with diabetic neuropathy, unspecified: Secondary | ICD-10-CM | POA: Diagnosis present

## 2015-08-07 DIAGNOSIS — Z7982 Long term (current) use of aspirin: Secondary | ICD-10-CM | POA: Diagnosis not present

## 2015-08-07 DIAGNOSIS — E872 Acidosis: Secondary | ICD-10-CM | POA: Diagnosis present

## 2015-08-07 DIAGNOSIS — E860 Dehydration: Secondary | ICD-10-CM | POA: Diagnosis present

## 2015-08-07 DIAGNOSIS — I959 Hypotension, unspecified: Secondary | ICD-10-CM | POA: Diagnosis present

## 2015-08-07 DIAGNOSIS — S2232XA Fracture of one rib, left side, initial encounter for closed fracture: Secondary | ICD-10-CM

## 2015-08-07 DIAGNOSIS — I129 Hypertensive chronic kidney disease with stage 1 through stage 4 chronic kidney disease, or unspecified chronic kidney disease: Secondary | ICD-10-CM | POA: Diagnosis present

## 2015-08-07 DIAGNOSIS — I950 Idiopathic hypotension: Secondary | ICD-10-CM | POA: Diagnosis not present

## 2015-08-07 DIAGNOSIS — R0781 Pleurodynia: Secondary | ICD-10-CM | POA: Diagnosis present

## 2015-08-07 DIAGNOSIS — E861 Hypovolemia: Secondary | ICD-10-CM | POA: Diagnosis present

## 2015-08-07 DIAGNOSIS — A419 Sepsis, unspecified organism: Secondary | ICD-10-CM

## 2015-08-07 LAB — URINALYSIS, ROUTINE W REFLEX MICROSCOPIC
Bilirubin Urine: NEGATIVE
Glucose, UA: >1000 mg/dL — AB
Hgb urine dipstick: NEGATIVE
Ketones, ur: NEGATIVE mg/dL
Leukocytes, UA: NEGATIVE
Nitrite: NEGATIVE
Protein, ur: NEGATIVE mg/dL
Specific Gravity, Urine: 1.019 (ref 1.005–1.030)
Urobilinogen, UA: 0.2 mg/dL (ref 0.0–1.0)
pH: 5 (ref 5.0–8.0)

## 2015-08-07 LAB — COMPREHENSIVE METABOLIC PANEL
ALT: 40 U/L (ref 17–63)
AST: 38 U/L (ref 15–41)
Albumin: 4.3 g/dL (ref 3.5–5.0)
Alkaline Phosphatase: 108 U/L (ref 38–126)
Anion gap: 11 (ref 5–15)
BUN: 34 mg/dL — ABNORMAL HIGH (ref 6–20)
CO2: 22 mmol/L (ref 22–32)
Calcium: 9.3 mg/dL (ref 8.9–10.3)
Chloride: 103 mmol/L (ref 101–111)
Creatinine, Ser: 3.05 mg/dL — ABNORMAL HIGH (ref 0.61–1.24)
GFR calc Af Amer: 22 mL/min — ABNORMAL LOW (ref >60)
GFR calc non Af Amer: 19 mL/min — ABNORMAL LOW (ref >60)
Glucose, Bld: 139 mg/dL — ABNORMAL HIGH (ref 65–99)
Potassium: 3.8 mmol/L (ref 3.5–5.1)
Sodium: 136 mmol/L (ref 135–145)
Total Bilirubin: 0.5 mg/dL (ref 0.3–1.2)
Total Protein: 7.9 g/dL (ref 6.5–8.1)

## 2015-08-07 LAB — CBC WITH DIFFERENTIAL/PLATELET
Basophils Absolute: 0 10*3/uL (ref 0.0–0.1)
Basophils Relative: 0 %
Eosinophils Absolute: 0.4 10*3/uL (ref 0.0–0.7)
Eosinophils Relative: 4 %
HCT: 35.8 % — ABNORMAL LOW (ref 39.0–52.0)
Hemoglobin: 11.7 g/dL — ABNORMAL LOW (ref 13.0–17.0)
Lymphocytes Relative: 31 %
Lymphs Abs: 3.1 10*3/uL (ref 0.7–4.0)
MCH: 30.1 pg (ref 26.0–34.0)
MCHC: 32.7 g/dL (ref 30.0–36.0)
MCV: 92 fL (ref 78.0–100.0)
Monocytes Absolute: 0.9 10*3/uL (ref 0.1–1.0)
Monocytes Relative: 9 %
Neutro Abs: 5.7 10*3/uL (ref 1.7–7.7)
Neutrophils Relative %: 56 %
Platelets: 208 10*3/uL (ref 150–400)
RBC: 3.89 MIL/uL — ABNORMAL LOW (ref 4.22–5.81)
RDW: 13.6 % (ref 11.5–15.5)
WBC: 10 10*3/uL (ref 4.0–10.5)

## 2015-08-07 LAB — I-STAT CHEM 8, ED
BUN: 28 mg/dL — ABNORMAL HIGH (ref 6–20)
Calcium, Ion: 1.26 mmol/L (ref 1.13–1.30)
Chloride: 103 mmol/L (ref 101–111)
Creatinine, Ser: 2.9 mg/dL — ABNORMAL HIGH (ref 0.61–1.24)
Glucose, Bld: 56 mg/dL — ABNORMAL LOW (ref 65–99)
HCT: 38 % — ABNORMAL LOW (ref 39.0–52.0)
Hemoglobin: 12.9 g/dL — ABNORMAL LOW (ref 13.0–17.0)
Potassium: 3.7 mmol/L (ref 3.5–5.1)
Sodium: 139 mmol/L (ref 135–145)
TCO2: 23 mmol/L (ref 0–100)

## 2015-08-07 LAB — URINE MICROSCOPIC-ADD ON

## 2015-08-07 LAB — CBG MONITORING, ED
Glucose-Capillary: 278 mg/dL — ABNORMAL HIGH (ref 65–99)
Glucose-Capillary: 84 mg/dL (ref 65–99)

## 2015-08-07 LAB — I-STAT CG4 LACTIC ACID, ED
Lactic Acid, Venous: 1.94 mmol/L (ref 0.5–2.0)
Lactic Acid, Venous: 4.24 mmol/L (ref 0.5–2.0)

## 2015-08-07 LAB — BETA-HYDROXYBUTYRIC ACID: Beta-Hydroxybutyric Acid: 0.07 mmol/L (ref 0.05–0.27)

## 2015-08-07 MED ORDER — SODIUM CHLORIDE 0.9 % IV BOLUS (SEPSIS)
1000.0000 mL | Freq: Once | INTRAVENOUS | Status: AC
  Administered 2015-08-07: 1000 mL via INTRAVENOUS

## 2015-08-07 NOTE — ED Notes (Signed)
Main lab called for phelbotomy stick.

## 2015-08-07 NOTE — ED Notes (Signed)
Pt's sister brought him and states that this morning pt sugar was over 600. Pt took 30 units and when she went to pick him up while ago after work his sugar was 386

## 2015-08-07 NOTE — ED Notes (Signed)
Pt states that he fell while ago when he was getting out of the bed. Pt c/o left rib cage pain.

## 2015-08-07 NOTE — H&P (Signed)
[facility] History and Physical  Sage Kopera TGG:[id number] DOB: [birth year] DOA: 08/07/2015  Referring physician: Malachy Moan, MD PCP: Mauricio Po, [name]   Chief Complaint: Fall  HPI: [name] [name] is a 69 y.o. male with prior history of DM HTN HLD DKA Seizures Stroke presents to the hospital after a fall. Patient states that he was trying to get out of bed and tripped over the bed pole. He states that he felt pain in his ribs and he thought he may have broken them. He denies having any syncope. He states that he has been dehydrated because he has not been drinking enough fluids. Apparently his sugar was high around 230 this afternoon. It was measured to be around 600. His sister gave him 30 units of 70/30 insulin but he states he only takes 18 units of 70/30. In the ED he was started on IVF and he was noted to have a low blood glucose of 56 on the blood work drawn at 18:50. Patient is now feeling a little better. His initial blood pressure on presentation was noted to be 96/55 but was as low as 80/42 and now improved to 97/61. He has no fevers or chills noted either.   Review of Systems:  Complete 12 point ROS performed and is unremarkable other than HPI   Past Medical History  Diagnosis Date  . Diabetes mellitus without complication ([place])   . Hypertension   . Hernia   . Back pain   . Seizures ([place])   . Diabetic neuropathy ([place])   . Stroke [facility])    Past Surgical History  Procedure Laterality Date  . Hip arthroplasty Right 02/05/2013    Procedure: ARTHROPLASTY BIPOLAR HIP;  Surgeon: [name], MD;  Location: [facility] ORS;  Service: Orthopedics;  Laterality: Right;   Social History:  reports that he has been smoking Cigarettes.  He has a 7.5 pack-year smoking history. He has never used smokeless tobacco. He reports that he drinks alcohol. He reports that he does not use illicit drugs.  No Known Allergies  Family History  Problem Relation Age of Onset  . Diabetes Mother     . Alzheimer's disease Mother   . Hypertension Mother   . Hyperlipidemia Father   . Hypertension Father   . Healthy Maternal Grandmother   . Pneumonia Maternal Grandfather      Prior to Admission medications   Medication Sig Start Date End Date Taking? Authorizing Provider  ALPRAZolam [name]) 1 MG tablet Take 1 tablet (1 mg total) by mouth 2 (two) times daily as needed for anxiety. 05/11/15  Yes Golden Circle, [name]  alprazolam [name]) 2 MG tablet Take 1 mg by mouth 2 (two) times daily as needed. 07/23/15  Yes Historical Provider, MD  amLODipine (NORVASC) 5 MG tablet Take 1 tablet (5 mg total) by mouth daily. 07/04/15  Yes Donita Brooks, NP  aspirin EC 325 MG tablet Take 1 tablet (325 mg total) by mouth daily. 06/08/15  Yes Rosalin Hawking, MD  atorvastatin (LIPITOR) 10 MG tablet Take 1 tablet (10 mg total) by mouth daily at 6 PM. Patient taking differently: Take 10 mg by mouth daily.  03/23/15  Yes [name], DO  FLUoxetine (PROZAC) 20 MG capsule Take 20 mg by mouth daily.   Yes Historical Provider, MD  folic acid (FOLVITE) 1 MG tablet Take 1 mg by mouth daily.   Yes Historical Provider, MD  insulin aspart protamine- aspart (NOVOLOG MIX 70/30) (70-30) 100 UNIT/ML injection Inject  0.18 mLs (18 Units total) into the skin 2 (two) times daily. Patient taking differently: Inject 12-18 Units into the skin 2 (two) times daily. Inject 18 units in the morning and using a sliding scale in the evening time inject 12 to 18 units 07/04/15  Yes Donita Brooks, NP  Oxycodone HCl 20 MG TABS Take 20 mg by mouth 2 (two) times daily as needed (pain).  05/24/15  Yes Historical Provider, MD  PRESCRIPTION MEDICATION Place 1 Dose into the right eye every 30 (thirty) days. Eye injection   Yes Historical Provider, MD  ramipril (ALTACE) 10 MG capsule Take 10 mg by mouth daily. 06/15/15  Yes Historical Provider, MD  blood glucose meter kit and supplies Dispense based on patient and insurance preference. Use up to four times  daily as directed. (FOR ICD-9 250.00, 250.01). 07/04/15   Donita Brooks, NP   Physical Exam: Filed Vitals:   08/07/15 1706 08/07/15 1913 08/07/15 2023 08/07/15 2100  BP: 114/61 80/42  119/82  Pulse: 63 60    Temp:   97.6 F (36.4 C)   TempSrc:   Oral   Resp: 18 18    SpO2: 96% 98%      Wt Readings from Last 3 Encounters:  07/08/15 78.835 kg (173 lb 12.8 oz)  07/04/15 67.631 kg (149 lb 1.6 oz)  06/08/15 69.31 kg (152 lb 12.8 oz)    General:  Appears calm and comfortable Eyes: PERRL, normal lids, irises & conjunctiva ENT: grossly normal hearing, lips & tongue Neck: no LAD, masses or thyromegaly Cardiovascular: RRR, no m/r/g. No LE edema. Respiratory: CTA bilaterally, no w/r/r. Normal respiratory effort. Abdomen: soft, ntnd Skin: no rash or induration seen on limited exam few abrasions noted on both LE Musculoskeletal: grossly normal tone BUE/BLE c/o pain in the left rib cage Psychiatric: grossly normal mood and affect, speech fluent and appropriate Neurologic: grossly non-focal.          Labs on Admission:  Basic Metabolic Panel:  Recent Labs Lab 08/07/15 1521 08/07/15 1850  NA 136 139  K 3.8 3.7  CL 103 103  CO2 22  --   GLUCOSE 139* 56*  BUN 34* 28*  CREATININE 3.05* 2.90*  CALCIUM 9.3  --    Liver Function Tests:  Recent Labs Lab 08/07/15 1521  AST 38  ALT 40  ALKPHOS 108  BILITOT 0.5  PROT 7.9  ALBUMIN 4.3   No results for input(s): LIPASE, AMYLASE in the last 168 hours. No results for input(s): AMMONIA in the last 168 hours. CBC:  Recent Labs Lab 08/07/15 1521 08/07/15 1850  WBC 10.0  --   NEUTROABS 5.7  --   HGB 11.7* 12.9*  HCT 35.8* 38.0*  MCV 92.0  --   PLT 208  --    Cardiac Enzymes: No results for input(s): CKTOTAL, CKMB, CKMBINDEX, TROPONINI in the last 168 hours.  BNP (last 3 results) No results for input(s): BNP in the last 8760 hours.  ProBNP (last 3 results) No results for input(s): PROBNP in the last 8760  hours.  CBG:  Recent Labs Lab 08/07/15 1442 08/07/15 1822  GLUCAP 278* 84    Radiological Exams on Admission: Dg Ribs Unilateral W/chest Left  08/07/2015  CLINICAL DATA:  Fall, mid axillary rib pain as marked. EXAM: LEFT RIBS AND CHEST - 3+ VIEW COMPARISON:  Chest x-ray dated 07/05/2015. FINDINGS: Single view of the chest and four views of the left ribs are provided. There is a slightly displaced  fracture of the left lateral fifth rib. No other rib fracture seen. Cardiomediastinal silhouette remains normal in size and configuration. Lungs are clear. No pleural effusion seen. No pneumothorax seen. Lung volumes are normal. IMPRESSION: Slightly displaced fracture of the left lateral fifth rib. Electronically Signed   By: Franki Cabot M.D.   On: 08/07/2015 15:14   Ct Head Wo Contrast  08/07/2015  CLINICAL DATA:  Patient fell tonight at home while going to the bathroom. Complaining of left-sided chest wall pain. EXAM: CT HEAD WITHOUT CONTRAST CT CERVICAL SPINE WITHOUT CONTRAST TECHNIQUE: Multidetector CT imaging of the head and cervical spine was performed following the standard protocol without intravenous contrast. Multiplanar CT image reconstructions of the cervical spine were also generated. COMPARISON:  07/02/2015 FINDINGS: CT HEAD FINDINGS Ventricles are normal in size and configuration. There are no parenchymal masses or mass effect. There is no evidence of a cortical infarct. Mild periventricular white matter hypoattenuation is noted consistent with chronic microvascular ischemic change. There are no extra-axial masses or abnormal fluid collections. There is no intracranial hemorrhage. Small amount of dependent fluid is seen in the visualize left maxillary sinus. Remaining sinuses are clear as are the mastoid air cells. No skull fracture. CT CERVICAL SPINE FINDINGS No fracture. No spondylolisthesis. There is a reversal of the normal cervical lordosis. This is centered at C4. There is marked  loss of disc height at C3-4 through C6-C7. Endplate spurring is noted at these levels. Facet joints are relatively well preserved. Bones are diffusely demineralized. Soft tissues are unremarkable.  Lung apices are clear. IMPRESSION: HEAD CT: No acute intracranial abnormalities. Mild chronic microvascular ischemic change. Small air-fluid level in the visualize left maxillary sinus, nonspecific. This was present on the prior CT. It is likely inflammatory in origin. CERVICAL CT:  No fracture or acute finding. Electronically Signed   By: Lajean Manes M.D.   On: 08/07/2015 20:49   Ct Cervical Spine Wo Contrast  08/07/2015  CLINICAL DATA:  Patient fell tonight at home while going to the bathroom. Complaining of left-sided chest wall pain. EXAM: CT HEAD WITHOUT CONTRAST CT CERVICAL SPINE WITHOUT CONTRAST TECHNIQUE: Multidetector CT imaging of the head and cervical spine was performed following the standard protocol without intravenous contrast. Multiplanar CT image reconstructions of the cervical spine were also generated. COMPARISON:  07/02/2015 FINDINGS: CT HEAD FINDINGS Ventricles are normal in size and configuration. There are no parenchymal masses or mass effect. There is no evidence of a cortical infarct. Mild periventricular white matter hypoattenuation is noted consistent with chronic microvascular ischemic change. There are no extra-axial masses or abnormal fluid collections. There is no intracranial hemorrhage. Small amount of dependent fluid is seen in the visualize left maxillary sinus. Remaining sinuses are clear as are the mastoid air cells. No skull fracture. CT CERVICAL SPINE FINDINGS No fracture. No spondylolisthesis. There is a reversal of the normal cervical lordosis. This is centered at C4. There is marked loss of disc height at C3-4 through C6-C7. Endplate spurring is noted at these levels. Facet joints are relatively well preserved. Bones are diffusely demineralized. Soft tissues are  unremarkable.  Lung apices are clear. IMPRESSION: HEAD CT: No acute intracranial abnormalities. Mild chronic microvascular ischemic change. Small air-fluid level in the visualize left maxillary sinus, nonspecific. This was present on the prior CT. It is likely inflammatory in origin. CERVICAL CT:  No fracture or acute finding. Electronically Signed   By: Lajean Manes M.D.   On: 08/07/2015 20:49     Assessment/Plan  Active Problems:   Fall   Acute-on-chronic kidney injury ([place])   Type II diabetes mellitus, uncontrolled ([place])   Type 2 diabetes mellitus with other circulatory complications ([place])   HLD (hyperlipidemia)   Hypotension   1. Hypotension secondary to dehydration -patient started on aggressive fluid resucitations -improvement noted on intial 2 liters with SBP now above 100 -will continue with fluids and monitor pressures -hold antihypertensives overnight due to HYPOtension  2. Acute on Chronic renal failure -likely secondary to dehydration -will repeat labs in am -continue with IVF  3. 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 HLD -will check lipid panel -continue with statins  5. Fall -patient has a slightly displaced left rib fracture -pain control -will hold xanax also  6. Elevated lactate -initial lactate was elevated but repeat was normal likely related to hypotension and renal issues -does not appear to be septic but will get cultures repeat lactate -check procalcitonin -started on aggressive fluid resuscitation as noted -as far as antibiotics no clear source if there is infection but I am going to start him empirically at least overnight   Code Status: full code (must indicate code status--if unknown or must be presumed,  indicate so) DVT Prophylaxis:heparin Family Communication: none (indicate person spoken with, if applicable, with phone number if by telephone) Disposition Plan: home (indicate anticipated LOS)    [facility] Pager [phone]

## 2015-08-07 NOTE — ED Notes (Signed)
MD made aware of patient's BP. Switched back to stepdown.

## 2015-08-07 NOTE — ED Notes (Signed)
Patient transported to CT 

## 2015-08-07 NOTE — ED Notes (Signed)
Main lab at bedside 

## 2015-08-07 NOTE — ED Provider Notes (Signed)
CSN: [id number]     Arrival date & time 08/07/15  1429 History   First MD Initiated Contact with Patient 08/07/15 1504     Chief Complaint  Patient presents with  . Fall  . rib cage pain   . Hyperglycemia     (Consider location/radiation/quality/duration/timing/severity/associated sxs/prior Treatment) HPI Comments: Patient presents to the emergency department for evaluation of a fall. Patient reportedly lost his balance and fell up against an object earlier today. He is complaining of pain in the left ribs. He did not hit his head or lose consciousness. Patient denies neck and back pain. There is no trouble breathing.  She is currently living with his sister. He has had multiple hospitalizations in the last 1-2 months for diabetic ketoacidosis. He dropped his insulin several days ago and the filum 12, was unable to use his insulin yesterday, but has obtained a new file today. His blood sugar was 600 this morning. Sister administered 30 units of NovoLog 70/30 this morning.  Patient is a 69 y.o. male presenting with fall and hyperglycemia.  Fall Associated symptoms include chest pain. Pertinent negatives include no shortness of breath.  Hyperglycemia Associated symptoms: chest pain   Associated symptoms: no shortness of breath     Past Medical History  Diagnosis Date  . Diabetes mellitus without complication ([place])   . Hypertension   . Hernia   . Back pain   . Seizures ([place])   . Diabetic neuropathy ([place])   . Stroke [facility])    Past Surgical History  Procedure Laterality Date  . Hip arthroplasty Right 02/05/2013    Procedure: ARTHROPLASTY BIPOLAR HIP;  Surgeon: [name], MD;  Location: [facility];  Service: Orthopedics;  Laterality: Right;   Family History  Problem Relation Age of Onset  . Diabetes Mother   . Alzheimer's disease Mother   . Hypertension Mother   . Hyperlipidemia Father   . Hypertension Father   . Healthy Maternal Grandmother   . Pneumonia Maternal  Grandfather    Social History  Substance Use Topics  . Smoking status: Current Every Day Smoker -- 0.25 packs/day for 30 years    Types: Cigarettes    Last Attempt to Quit: 01/31/2013  . Smokeless tobacco: Never Used  . Alcohol Use: Yes    Review of Systems  Respiratory: Negative for shortness of breath.   Cardiovascular: Positive for chest pain.  All other systems reviewed and are negative.     Allergies  Review of patient's allergies indicates no known allergies.  Home Medications   Prior to Admission medications   Medication Sig Start Date End Date Taking? Authorizing Provider  ALPRAZolam [name]) 1 MG tablet Take 1 tablet (1 mg total) by mouth 2 (two) times daily as needed for anxiety. 05/11/15  Yes Golden Circle, [name]  alprazolam [name]) 2 MG tablet Take 1 mg by mouth 2 (two) times daily as needed. 07/23/15  Yes Historical Provider, MD  amLODipine (NORVASC) 5 MG tablet Take 1 tablet (5 mg total) by mouth daily. 07/04/15  Yes Donita Brooks, NP  aspirin EC 325 MG tablet Take 1 tablet (325 mg total) by mouth daily. 06/08/15  Yes Rosalin Hawking, MD  atorvastatin (LIPITOR) 10 MG tablet Take 1 tablet (10 mg total) by mouth daily at 6 PM. Patient taking differently: Take 10 mg by mouth daily.  03/23/15  Yes [name], DO  FLUoxetine (PROZAC) 20 MG capsule Take 20 mg by mouth daily.   Yes Historical Provider,  MD  folic acid (FOLVITE) 1 MG tablet Take 1 mg by mouth daily.   Yes Historical Provider, MD  insulin aspart protamine- aspart (NOVOLOG MIX 70/30) (70-30) 100 UNIT/ML injection Inject 0.18 mLs (18 Units total) into the skin 2 (two) times daily. Patient taking differently: Inject 12-18 Units into the skin 2 (two) times daily. Inject 18 units in the morning and using a sliding scale in the evening time inject 12 to 18 units 07/04/15  Yes Donita Brooks, NP  Oxycodone HCl 20 MG TABS Take 20 mg by mouth 2 (two) times daily as needed (pain).  05/24/15  Yes Historical Provider, MD    PRESCRIPTION MEDICATION Place 1 Dose into the right eye every 30 (thirty) days. Eye injection   Yes Historical Provider, MD  ramipril (ALTACE) 10 MG capsule Take 10 mg by mouth daily. 06/15/15  Yes Historical Provider, MD  blood glucose meter kit and supplies Dispense based on patient and insurance preference. Use up to four times daily as directed. (FOR ICD-9 250.00, 250.01). 07/04/15   Donita Brooks, NP   BP 119/82 mmHg  Pulse 60  Temp(Src) 97.6 F (36.4 C) (Oral)  Resp 18  SpO2 98% Physical Exam  Constitutional: He is oriented to person, place, and time. He appears well-developed and well-nourished. No distress.  HENT:  Head: Normocephalic and atraumatic.  Right Ear: Hearing normal.  Left Ear: Hearing normal.  Nose: Nose normal.  Mouth/Throat: Oropharynx is clear and moist and mucous membranes are normal.  Eyes: Conjunctivae and EOM are normal. Pupils are equal, round, and reactive to light.  Neck: Normal range of motion. Neck supple.  Cardiovascular: Regular rhythm, S1 normal and S2 normal.  Exam reveals no gallop and no friction rub.   No murmur heard. Pulmonary/Chest: Effort normal and breath sounds normal. No respiratory distress. He exhibits tenderness. He exhibits no crepitus.    Abdominal: Soft. Normal appearance and bowel sounds are normal. There is no hepatosplenomegaly. There is no tenderness. There is no rebound, no guarding, no tenderness at McBurney's point and negative Murphy's sign. No hernia.  Musculoskeletal: Normal range of motion.  Neurological: He is alert and oriented to person, place, and time. He has normal strength. No cranial nerve deficit or sensory deficit. Coordination normal. GCS eye subscore is 4. GCS verbal subscore is 5. GCS motor subscore is 6.  Skin: Skin is warm, dry and intact. No rash noted. No cyanosis.  Psychiatric: He has a normal mood and affect. His speech is normal and behavior is normal. Thought content normal.  Nursing note and vitals  reviewed.   ED Course  Procedures (including critical care time) Labs Review Labs Reviewed  CBC WITH DIFFERENTIAL/PLATELET - Abnormal; Notable for the following:    RBC 3.89 (*)    Hemoglobin 11.7 (*)    HCT 35.8 (*)    All other components within normal limits  COMPREHENSIVE METABOLIC PANEL - Abnormal; Notable for the following:    Glucose, Bld 139 (*)    BUN 34 (*)    Creatinine, Ser 3.05 (*)    GFR calc non Af Amer 19 (*)    GFR calc Af Amer 22 (*)    All other components within normal limits  URINALYSIS, ROUTINE W REFLEX MICROSCOPIC (NOT AT [facility]) - Abnormal; Notable for the following:    APPearance CLOUDY (*)    Glucose, UA >1000 (*)    All other components within normal limits  URINE MICROSCOPIC-ADD ON - Abnormal; Notable for the following:  Bacteria, UA FEW (*)    Casts WBC CAST (*)    All other components within normal limits  CBG MONITORING, ED - Abnormal; Notable for the following:    Glucose-Capillary 278 (*)    All other components within normal limits  I-STAT CHEM 8, ED - Abnormal; Notable for the following:    BUN 28 (*)    Creatinine, Ser 2.90 (*)    Glucose, Bld 56 (*)    Hemoglobin 12.9 (*)    HCT 38.0 (*)    All other components within normal limits  BETA-HYDROXYBUTYRIC ACID  CBG MONITORING, ED  I-STAT CG4 LACTIC ACID, ED    Imaging Review Dg Ribs Unilateral W/chest Left  08/07/2015  CLINICAL DATA:  Fall, mid axillary rib pain as marked. EXAM: LEFT RIBS AND CHEST - 3+ VIEW COMPARISON:  Chest x-ray dated 07/05/2015. FINDINGS: Single view of the chest and four views of the left ribs are provided. There is a slightly displaced fracture of the left lateral fifth rib. No other rib fracture seen. Cardiomediastinal silhouette remains normal in size and configuration. Lungs are clear. No pleural effusion seen. No pneumothorax seen. Lung volumes are normal. IMPRESSION: Slightly displaced fracture of the left lateral fifth rib. Electronically Signed   By: Franki Cabot M.D.   On: 08/07/2015 15:14   Ct Head Wo Contrast  08/07/2015  CLINICAL DATA:  Patient fell tonight at home while going to the bathroom. Complaining of left-sided chest wall pain. EXAM: CT HEAD WITHOUT CONTRAST CT CERVICAL SPINE WITHOUT CONTRAST TECHNIQUE: Multidetector CT imaging of the head and cervical spine was performed following the standard protocol without intravenous contrast. Multiplanar CT image reconstructions of the cervical spine were also generated. COMPARISON:  07/02/2015 FINDINGS: CT HEAD FINDINGS Ventricles are normal in size and configuration. There are no parenchymal masses or mass effect. There is no evidence of a cortical infarct. Mild periventricular white matter hypoattenuation is noted consistent with chronic microvascular ischemic change. There are no extra-axial masses or abnormal fluid collections. There is no intracranial hemorrhage. Small amount of dependent fluid is seen in the visualize left maxillary sinus. Remaining sinuses are clear as are the mastoid air cells. No skull fracture. CT CERVICAL SPINE FINDINGS No fracture. No spondylolisthesis. There is a reversal of the normal cervical lordosis. This is centered at C4. There is marked loss of disc height at C3-4 through C6-C7. Endplate spurring is noted at these levels. Facet joints are relatively well preserved. Bones are diffusely demineralized. Soft tissues are unremarkable.  Lung apices are clear. IMPRESSION: HEAD CT: No acute intracranial abnormalities. Mild chronic microvascular ischemic change. Small air-fluid level in the visualize left maxillary sinus, nonspecific. This was present on the prior CT. It is likely inflammatory in origin. CERVICAL CT:  No fracture or acute finding. Electronically Signed   By: Lajean Manes M.D.   On: 08/07/2015 20:49   Ct Cervical Spine Wo Contrast  08/07/2015  CLINICAL DATA:  Patient fell tonight at home while going to the bathroom. Complaining of left-sided chest wall pain.  EXAM: CT HEAD WITHOUT CONTRAST CT CERVICAL SPINE WITHOUT CONTRAST TECHNIQUE: Multidetector CT imaging of the head and cervical spine was performed following the standard protocol without intravenous contrast. Multiplanar CT image reconstructions of the cervical spine were also generated. COMPARISON:  07/02/2015 FINDINGS: CT HEAD FINDINGS Ventricles are normal in size and configuration. There are no parenchymal masses or mass effect. There is no evidence of a cortical infarct. Mild periventricular white matter hypoattenuation is noted  consistent with chronic microvascular ischemic change. There are no extra-axial masses or abnormal fluid collections. There is no intracranial hemorrhage. Small amount of dependent fluid is seen in the visualize left maxillary sinus. Remaining sinuses are clear as are the mastoid air cells. No skull fracture. CT CERVICAL SPINE FINDINGS No fracture. No spondylolisthesis. There is a reversal of the normal cervical lordosis. This is centered at C4. There is marked loss of disc height at C3-4 through C6-C7. Endplate spurring is noted at these levels. Facet joints are relatively well preserved. Bones are diffusely demineralized. Soft tissues are unremarkable.  Lung apices are clear. IMPRESSION: HEAD CT: No acute intracranial abnormalities. Mild chronic microvascular ischemic change. Small air-fluid level in the visualize left maxillary sinus, nonspecific. This was present on the prior CT. It is likely inflammatory in origin. CERVICAL CT:  No fracture or acute finding. Electronically Signed   By: Lajean Manes M.D.   On: 08/07/2015 20:49   I have personally reviewed and evaluated these images and lab results as part of my medical decision-making.   EKG Interpretation None      MDM   Final diagnoses:  Closed rib fracture, left, initial encounter  Hyperglycemia  AKI (acute kidney injury) [facility])   Patient presents to the emergency department after a fall. Patient complaining of  left rib pain. X-ray reveals no left lateral fifth rib fracture, no lung injury. CT head and cervical spine performed, no injury noted.  Patient was noted to be somnolent here in the ER. Sister reports that this is typical for the patient. He did not sleep much last night she thinks he is just tired. He was noted to be somewhat hypotensive here in the ER. With his hyperglycemia, this was felt likely dehydration, but sepsis was considered. No source of fever is identified. Chest x-ray clear. Urine is clear. Patient had been administered 30 units of 70/30 insulin prior to arrival in the ER and his glucose has dropped. He actually dropped below 60 and has been given 2 separate meals here in the ER.  Discussed acute kidney injury with Dr. Justin Mend, on call for nephrology. He recommends admission to the hospital for IV hydration. Recheck creatinine in the morning and contact nephrology if not improving with hydration.   Orpah Greek, MD 08/07/15 2121

## 2015-08-08 ENCOUNTER — Inpatient Hospital Stay (HOSPITAL_COMMUNITY): Payer: Medicare HMO

## 2015-08-08 DIAGNOSIS — S2232XA Fracture of one rib, left side, initial encounter for closed fracture: Principal | ICD-10-CM

## 2015-08-08 DIAGNOSIS — I950 Idiopathic hypotension: Secondary | ICD-10-CM

## 2015-08-08 LAB — GLUCOSE, CAPILLARY
Glucose-Capillary: 162 mg/dL — ABNORMAL HIGH (ref 65–99)
Glucose-Capillary: 156 mg/dL — ABNORMAL HIGH (ref 65–99)
Glucose-Capillary: 153 mg/dL — ABNORMAL HIGH (ref 65–99)
Glucose-Capillary: 259 mg/dL — ABNORMAL HIGH (ref 65–99)
Glucose-Capillary: 123 mg/dL — ABNORMAL HIGH (ref 65–99)
Glucose-Capillary: 194 mg/dL — ABNORMAL HIGH (ref 65–99)

## 2015-08-08 LAB — TROPONIN I
Troponin I: <0.03 ng/mL (ref <0.031)
Troponin I: <0.03 ng/mL (ref <0.031)

## 2015-08-08 LAB — LACTIC ACID, PLASMA
Lactic Acid, Venous: 1.3 mmol/L (ref 0.5–2.0)
Lactic Acid, Venous: 2.7 mmol/L (ref 0.5–2.0)

## 2015-08-08 LAB — COMPREHENSIVE METABOLIC PANEL
ALT: 26 U/L (ref 17–63)
AST: 28 U/L (ref 15–41)
Albumin: 2.8 g/dL — ABNORMAL LOW (ref 3.5–5.0)
Alkaline Phosphatase: 78 U/L (ref 38–126)
Anion gap: 6 (ref 5–15)
BUN: 31 mg/dL — ABNORMAL HIGH (ref 6–20)
CO2: 20 mmol/L — ABNORMAL LOW (ref 22–32)
Calcium: 7.5 mg/dL — ABNORMAL LOW (ref 8.9–10.3)
Chloride: 110 mmol/L (ref 101–111)
Creatinine, Ser: 3.16 mg/dL — ABNORMAL HIGH (ref 0.61–1.24)
GFR calc Af Amer: 21 mL/min — ABNORMAL LOW (ref >60)
GFR calc non Af Amer: 18 mL/min — ABNORMAL LOW (ref >60)
Glucose, Bld: 99 mg/dL (ref 65–99)
Potassium: 4.4 mmol/L (ref 3.5–5.1)
Sodium: 136 mmol/L (ref 135–145)
Total Bilirubin: 0.4 mg/dL (ref 0.3–1.2)
Total Protein: 5.4 g/dL — ABNORMAL LOW (ref 6.5–8.1)

## 2015-08-08 LAB — CBC WITH DIFFERENTIAL/PLATELET
Basophils Absolute: 0 10*3/uL (ref 0.0–0.1)
Basophils Relative: 0 %
Eosinophils Absolute: 0.5 10*3/uL (ref 0.0–0.7)
Eosinophils Relative: 7 %
HCT: 27.2 % — ABNORMAL LOW (ref 39.0–52.0)
Hemoglobin: 9 g/dL — ABNORMAL LOW (ref 13.0–17.0)
Lymphocytes Relative: 37 %
Lymphs Abs: 2.8 10*3/uL (ref 0.7–4.0)
MCH: 30.5 pg (ref 26.0–34.0)
MCHC: 33.1 g/dL (ref 30.0–36.0)
MCV: 92.2 fL (ref 78.0–100.0)
Monocytes Absolute: 0.7 10*3/uL (ref 0.1–1.0)
Monocytes Relative: 9 %
Neutro Abs: 3.6 10*3/uL (ref 1.7–7.7)
Neutrophils Relative %: 47 %
Platelets: 178 10*3/uL (ref 150–400)
RBC: 2.95 MIL/uL — ABNORMAL LOW (ref 4.22–5.81)
RDW: 13.7 % (ref 11.5–15.5)
WBC: 7.5 10*3/uL (ref 4.0–10.5)

## 2015-08-08 LAB — CBC
HCT: 28.9 % — ABNORMAL LOW (ref 39.0–52.0)
Hemoglobin: 9.3 g/dL — ABNORMAL LOW (ref 13.0–17.0)
MCH: 30.1 pg (ref 26.0–34.0)
MCHC: 32.2 g/dL (ref 30.0–36.0)
MCV: 93.5 fL (ref 78.0–100.0)
Platelets: 181 10*3/uL (ref 150–400)
RBC: 3.09 MIL/uL — ABNORMAL LOW (ref 4.22–5.81)
RDW: 14 % (ref 11.5–15.5)
WBC: 7.1 10*3/uL (ref 4.0–10.5)

## 2015-08-08 LAB — APTT: aPTT: 22 seconds — ABNORMAL LOW (ref 24–37)

## 2015-08-08 LAB — MRSA PCR SCREENING: MRSA by PCR: NEGATIVE

## 2015-08-08 LAB — LIPID PANEL
Cholesterol: 134 mg/dL (ref 0–200)
HDL: 46 mg/dL (ref >40)
LDL Cholesterol: 55 mg/dL (ref 0–99)
Total CHOL/HDL Ratio: 2.9 RATIO
Triglycerides: 163 mg/dL — ABNORMAL HIGH (ref <150)
VLDL: 33 mg/dL (ref 0–40)

## 2015-08-08 LAB — PROTIME-INR
INR: 0.96 (ref 0.00–1.49)
Prothrombin Time: 13 seconds (ref 11.6–15.2)

## 2015-08-08 LAB — TSH: TSH: 2.2 u[IU]/mL (ref 0.350–4.500)

## 2015-08-08 LAB — CBG MONITORING, ED: Glucose-Capillary: 94 mg/dL (ref 65–99)

## 2015-08-08 LAB — PROCALCITONIN: Procalcitonin: 1.79 ng/mL

## 2015-08-08 MED ORDER — MORPHINE SULFATE (PF) 4 MG/ML IV SOLN
4.0000 mg | INTRAVENOUS | Status: DC | PRN
  Administered 2015-08-08 – 2015-08-10 (×4): 4 mg via INTRAVENOUS
  Filled 2015-08-08 (×5): qty 1

## 2015-08-08 MED ORDER — VANCOMYCIN HCL IN DEXTROSE 1-5 GM/200ML-% IV SOLN
1000.0000 mg | Freq: Once | INTRAVENOUS | Status: AC
Start: 2015-08-08 — End: 2015-08-08
  Administered 2015-08-08: 1000 mg via INTRAVENOUS
  Filled 2015-08-08: qty 200

## 2015-08-08 MED ORDER — ADULT MULTIVITAMIN W/MINERALS CH
1.0000 | ORAL_TABLET | Freq: Every day | ORAL | Status: DC
  Administered 2015-08-08 – 2015-08-10 (×3): 1 via ORAL
  Filled 2015-08-08 (×3): qty 1

## 2015-08-08 MED ORDER — ONDANSETRON HCL 4 MG PO TABS: 4.0000 mg | ORAL_TABLET | Freq: Four times a day (QID) | ORAL | Status: DC | PRN

## 2015-08-08 MED ORDER — ONDANSETRON HCL 4 MG/2ML IJ SOLN: 4.0000 mg | Freq: Four times a day (QID) | INTRAMUSCULAR | Status: DC | PRN

## 2015-08-08 MED ORDER — INSULIN DETEMIR 100 UNIT/ML ~~LOC~~ SOLN
10.0000 [IU] | Freq: Every day | SUBCUTANEOUS | Status: DC
  Filled 2015-08-08: qty 0.1

## 2015-08-08 MED ORDER — DEXTROSE-NACL 5-0.9 % IV SOLN
INTRAVENOUS | Status: DC
  Administered 2015-08-08: 03:00:00 via INTRAVENOUS

## 2015-08-08 MED ORDER — FOLIC ACID 1 MG PO TABS
1.0000 mg | ORAL_TABLET | Freq: Every day | ORAL | Status: DC
  Administered 2015-08-08 – 2015-08-10 (×3): 1 mg via ORAL
  Filled 2015-08-08 (×3): qty 1

## 2015-08-08 MED ORDER — OXYCODONE HCL 5 MG PO TABS
5.0000 mg | ORAL_TABLET | ORAL | Status: DC | PRN
  Administered 2015-08-08 – 2015-08-09 (×5): 5 mg via ORAL
  Filled 2015-08-08 (×5): qty 1

## 2015-08-08 MED ORDER — SODIUM CHLORIDE 0.9 % IJ SOLN
3.0000 mL | Freq: Two times a day (BID) | INTRAMUSCULAR | Status: DC
  Administered 2015-08-08 – 2015-08-10 (×4): 3 mL via INTRAVENOUS

## 2015-08-08 MED ORDER — INSULIN ASPART 100 UNIT/ML ~~LOC~~ SOLN: 0.0000 [IU] | Freq: Every day | SUBCUTANEOUS | Status: DC

## 2015-08-08 MED ORDER — SODIUM CHLORIDE 0.9 % IV BOLUS (SEPSIS)
1000.0000 mL | INTRAVENOUS | Status: AC
  Administered 2015-08-08 (×2): 1000 mL via INTRAVENOUS

## 2015-08-08 MED ORDER — PIPERACILLIN-TAZOBACTAM IN DEX 2-0.25 GM/50ML IV SOLN
2.2500 g | Freq: Four times a day (QID) | INTRAVENOUS | Status: DC
  Filled 2015-08-08 (×2): qty 50

## 2015-08-08 MED ORDER — SODIUM CHLORIDE 0.9 % IV BOLUS (SEPSIS)
1000.0000 mL | Freq: Once | INTRAVENOUS | Status: AC
  Administered 2015-08-08: 1000 mL via INTRAVENOUS

## 2015-08-08 MED ORDER — VANCOMYCIN HCL IN DEXTROSE 1-5 GM/200ML-% IV SOLN
1000.0000 mg | INTRAVENOUS | Status: DC
Start: 2015-08-10 — End: 2015-08-08

## 2015-08-08 MED ORDER — ENSURE ENLIVE PO LIQD
237.0000 mL | Freq: Two times a day (BID) | ORAL | Status: DC
  Administered 2015-08-08 – 2015-08-10 (×5): 237 mL via ORAL

## 2015-08-08 MED ORDER — ACETAMINOPHEN 650 MG RE SUPP: 650.0000 mg | Freq: Four times a day (QID) | RECTAL | Status: DC | PRN

## 2015-08-08 MED ORDER — HEPARIN SODIUM (PORCINE) 5000 UNIT/ML IJ SOLN
5000.0000 [IU] | Freq: Three times a day (TID) | INTRAMUSCULAR | Status: DC
  Administered 2015-08-08 – 2015-08-10 (×6): 5000 [IU] via SUBCUTANEOUS
  Filled 2015-08-08 (×9): qty 1

## 2015-08-08 MED ORDER — INSULIN ASPART 100 UNIT/ML ~~LOC~~ SOLN
0.0000 [IU] | Freq: Three times a day (TID) | SUBCUTANEOUS | Status: DC
  Administered 2015-08-08: 5 [IU] via SUBCUTANEOUS
  Administered 2015-08-08: 2 [IU] via SUBCUTANEOUS
  Administered 2015-08-08 – 2015-08-09 (×2): 1 [IU] via SUBCUTANEOUS
  Administered 2015-08-09: 7 [IU] via SUBCUTANEOUS
  Administered 2015-08-09: 1 [IU] via SUBCUTANEOUS
  Administered 2015-08-10: 5 [IU] via SUBCUTANEOUS

## 2015-08-08 MED ORDER — VITAMIN B-1 100 MG PO TABS
100.0000 mg | ORAL_TABLET | Freq: Every day | ORAL | Status: DC
  Administered 2015-08-08 – 2015-08-10 (×3): 100 mg via ORAL
  Filled 2015-08-08 (×3): qty 1

## 2015-08-08 MED ORDER — FLUOXETINE HCL 20 MG PO CAPS
20.0000 mg | ORAL_CAPSULE | Freq: Every day | ORAL | Status: DC
  Administered 2015-08-08 – 2015-08-10 (×3): 20 mg via ORAL
  Filled 2015-08-08 (×3): qty 1

## 2015-08-08 MED ORDER — ASPIRIN EC 325 MG PO TBEC: 325.0000 mg | DELAYED_RELEASE_TABLET | Freq: Every day | ORAL | Status: DC

## 2015-08-08 MED ORDER — INSULIN ASPART 100 UNIT/ML ~~LOC~~ SOLN
3.0000 [IU] | Freq: Three times a day (TID) | SUBCUTANEOUS | Status: DC
  Administered 2015-08-08 – 2015-08-10 (×6): 3 [IU] via SUBCUTANEOUS

## 2015-08-08 MED ORDER — INSULIN DETEMIR 100 UNIT/ML ~~LOC~~ SOLN
10.0000 [IU] | Freq: Every day | SUBCUTANEOUS | Status: DC
  Administered 2015-08-08: 10 [IU] via SUBCUTANEOUS
  Filled 2015-08-08: qty 0.1

## 2015-08-08 MED ORDER — ATORVASTATIN CALCIUM 10 MG PO TABS
10.0000 mg | ORAL_TABLET | Freq: Every day | ORAL | Status: DC
Start: 2015-08-08 — End: 2015-08-10
  Administered 2015-08-08 – 2015-08-09 (×2): 10 mg via ORAL
  Filled 2015-08-08 (×3): qty 1

## 2015-08-08 MED ORDER — SODIUM CHLORIDE 0.9 % IV SOLN
INTRAVENOUS | Status: AC
  Administered 2015-08-08: 11:00:00 via INTRAVENOUS

## 2015-08-08 MED ORDER — INSULIN ASPART 100 UNIT/ML ~~LOC~~ SOLN: 0.0000 [IU] | SUBCUTANEOUS | Status: DC

## 2015-08-08 MED ORDER — PIPERACILLIN-TAZOBACTAM 3.375 G IVPB 30 MIN
3.3750 g | Freq: Once | INTRAVENOUS | Status: AC
  Administered 2015-08-08: 3.375 g via INTRAVENOUS
  Filled 2015-08-08: qty 50

## 2015-08-08 MED ORDER — ASPIRIN EC 81 MG PO TBEC
81.0000 mg | DELAYED_RELEASE_TABLET | Freq: Every day | ORAL | Status: DC
  Administered 2015-08-08 – 2015-08-10 (×3): 81 mg via ORAL
  Filled 2015-08-08 (×3): qty 1

## 2015-08-08 MED ORDER — ACETAMINOPHEN 325 MG PO TABS
650.0000 mg | ORAL_TABLET | Freq: Four times a day (QID) | ORAL | Status: DC | PRN
  Administered 2015-08-08 – 2015-08-09 (×2): 650 mg via ORAL
  Filled 2015-08-08 (×2): qty 2

## 2015-08-08 MED ORDER — POLYETHYLENE GLYCOL 3350 17 G PO PACK
17.0000 g | PACK | Freq: Every day | ORAL | Status: AC
Start: 2015-08-08 — End: 2015-08-10
  Administered 2015-08-08 – 2015-08-10 (×3): 17 g via ORAL
  Filled 2015-08-08 (×3): qty 1

## 2015-08-08 NOTE — Progress Notes (Signed)
ANTIBIOTIC CONSULT NOTE - INITIAL  Pharmacy Consult for Zosyn/Vancomycin Indication: Sepsis  No Known Allergies  Patient Measurements:   Wt=  Vital Signs: Temp: 97.6 F (36.4 C) (10/25 2023) Temp Source: Oral (10/25 2023) BP: 89/44 mmHg (10/25 2330) Pulse Rate: 54 (10/25 2330) Intake/Output from previous day:   Intake/Output from this shift:    Labs:  Recent Labs  08/07/15 1521 08/07/15 1850  WBC 10.0  --   HGB 11.7* 12.9*  PLT 208  --   CREATININE 3.05* 2.90*   CrCl cannot be calculated (Unknown ideal weight.). No results for input(s): VANCOTROUGH, VANCOPEAK, VANCORANDOM, GENTTROUGH, GENTPEAK, GENTRANDOM, TOBRATROUGH, TOBRAPEAK, TOBRARND, AMIKACINPEAK, AMIKACINTROU, AMIKACIN in the last 72 hours.   Microbiology: No results found for this or any previous visit (from the past 720 hour(s)).  Medical History: Past Medical History  Diagnosis Date  . Diabetes mellitus without complication ([place])   . Hypertension   . Hernia   . Back pain   . Seizures ([place])   . Diabetic neuropathy ([place])   . Stroke [facility])     Medications:   (Not in a hospital admission) Scheduled:  . aspirin EC  325 mg Oral Daily  . atorvastatin  10 mg Oral Daily  . FLUoxetine  20 mg Oral Daily  . folic acid  1 mg Oral Daily  . heparin  5,000 Units Subcutaneous 3 times per day  . insulin aspart  0-9 Units Subcutaneous 6 times per day  . multivitamin with minerals  1 tablet Oral Daily  . sodium chloride  3 mL Intravenous Q12H  . thiamine  100 mg Oral Daily   Infusions:  . dextrose 5 % and 0.9% NaCl    . piperacillin-tazobactam    . sodium chloride 1,000 mL (08/08/15 0105)  . sodium chloride 1,000 mL (08/08/15 0105)  . vancomycin     Assessment: 54 yoM c/o fall. Scr worsening since admission, will dose cautiously.  Zosyn/Vancomycin per Rx.  Goal of Therapy:  Vancomycin trough level 15-20 mcg/ml  Plan:   Zosyn 3.375 Gm x1 then 2.25 Gm IV q6h  Vancomycin 1Gm q48h  F/u  SCr/cultures/levels  Lawana Pai R 08/08/2015,1:06 AM

## 2015-08-08 NOTE — Care Management Note (Signed)
Case Management Note  Patient Details  Name: [name] [name] MRN: [id number] Date of Birth: 1946/07/16  Subjective/Objective:          Hypovolemia and hypotension     /6 rib fractures     Action/Plan:Date: August 08, 2015 Chart reviewed for concurrent status and case management needs. Will continue to follow patient for changes and needs: [name], RN, BSN, [place]   [phone]   Expected Discharge Date:   (unknown)               Expected Discharge Plan:  Home/Self Care  In-House Referral:  NA  Discharge planning Services  CM Consult  Post Acute Care Choice:  NA Choice offered to:  NA  DME Arranged:    DME Agency:     HH Arranged:    HH Agency:     Status of Service:  In process, will continue to follow  Medicare Important Message Given:    Date Medicare IM Given:    Medicare IM give by:    Date Additional Medicare IM Given:    Additional Medicare Important Message give by:     If discussed at [place] of Stay Meetings, dates discussed:    Additional Comments:  Leeroy Cha, RN 08/08/2015, 8:53 AM

## 2015-08-08 NOTE — Evaluation (Signed)
Physical Therapy One Time Evaluation Patient Details Name: [name] [name] MRN: [id number] DOB: 1945/11/16 Today's Date: 08/08/2015   History of Present Illness  69 y.o. male with prior history of DM, HTN, HLD, DKA, Seizures, Stroke presents to the hospital after a fall leading to left 6th rib fracture and admitted with dehydration, hypotension, acute on chronic renal failure  Clinical Impression  Patient evaluated by Physical Therapy with no further acute PT needs identified. All education has been completed and the patient has no further questions.  Pt mobilizing well and reporting 7/10 rib pain during gait however no assist required.  Pt currently supervision level due to multiple lines and leads. No further follow up Physical Therapy or equipment needs. Recommend pt ambulate with staff during acute stay.  PT is signing off. Thank you for this referral.     Follow Up Recommendations No PT follow up    Equipment Recommendations  None recommended by PT    Recommendations for Other Services       Precautions / Restrictions Precautions Precautions: Fall Precaution Comments: L rib fx      Mobility  Bed Mobility Overal bed mobility: Needs Assistance Bed Mobility: Supine to Sit;Sit to Supine     Supine to sit: Supervision Sit to supine: Supervision   General bed mobility comments: supervision only for lines/leads  Transfers Overall transfer level: Needs assistance Equipment used: None Transfers: Sit to/from Stand Sit to Stand: Supervision            Ambulation/Gait Ambulation/Gait assistance: Supervision Ambulation Distance (Feet): 300 Feet Assistive device: None Gait Pattern/deviations: WFL(Within Functional Limits)     General Gait Details: good pace, no unsteady gait, able to look around environment with LOB, HR and SpO2 WNL  Stairs            Wheelchair Mobility    Modified Rankin (Stroke Patients Only)       Balance Overall balance assessment:  History of Falls (reports fall prior to admission is his only fall hx)                                           Pertinent Vitals/Pain Pain Assessment: 0-10 Pain Score: 7  Pain Location: L rib (with mobility) Pain Descriptors / Indicators: Sore Pain Intervention(s): Limited activity within patient's tolerance;Monitored during session;Repositioned    Home Living Family/patient expects to be discharged to:: Private residence Living Arrangements: Alone Available Help at Discharge: Family;Available 24 hours/day Type of Home: House Home Access: Level entry     Home Layout: One level Home Equipment: None      Prior Function Level of Independence: Independent               Hand Dominance        Extremity/Trunk Assessment               Lower Extremity Assessment: Overall WFL for tasks assessed      Cervical / Trunk Assessment: Normal  Communication   Communication: No difficulties  Cognition Arousal/Alertness: Awake/alert Behavior During Therapy: WFL for tasks assessed/performed Overall Cognitive Status: Within Functional Limits for tasks assessed                      General Comments      Exercises        Assessment/Plan    PT Assessment Patent does not need any  further PT services  PT Diagnosis Difficulty walking   PT Problem List    PT Treatment Interventions     PT Goals (Current goals can be found in the Care Plan section) Acute Rehab PT Goals PT Goal Formulation: All assessment and education complete, DC therapy    Frequency     Barriers to discharge        Co-evaluation               End of Session   Activity Tolerance: Patient tolerated treatment well Patient left: in bed;with call bell/phone within reach;with bed alarm set           Time: [id number] PT Time Calculation (min) (ACUTE ONLY): 10 min   Charges:   PT Evaluation $Initial PT Evaluation Tier I: 1 Procedure     PT G Codes:         Keelin Neville,KATHrine E 08/08/2015, 3:28 PM Carmelia Bake, PT, DPT 08/08/2015 Pager: [phone]

## 2015-08-08 NOTE — Progress Notes (Signed)
CRITICAL VALUE ALERT  Critical value received:  Lactic Acid 2.7  Date of notification:  08/08/2015  Time of notification: 0750   Critical value read back:Yes.    Nurse who received alert:  Jeannie Fend RN  MD notified (1st page):  Dr. Carmell Austria  Time of first page:  0750  MD notified (2nd page): Dr. Aileen Fass  Time of second [id number]  Responding MD:  Dr. Carmell Austria  Time MD responded:  [id number]

## 2015-08-08 NOTE — Progress Notes (Signed)
Initial Nutrition Assessment  DOCUMENTATION CODES:   Not applicable  INTERVENTION:  - Will order Ensure Enlive po BID, each supplement provides 350 kcal and 20 grams of protein - RD will continue to monitor for needs  NUTRITION DIAGNOSIS:   Biting/chewing difficulty related to other (see comment) (missing teeth and dentures not present) as evidenced by per patient/family report.  GOAL:   Patient will meet greater than or equal to 90% of their needs  MONITOR:   PO intake, Supplement acceptance, Weight trends, Labs, Skin, I & O's  REASON FOR ASSESSMENT:   Malnutrition Screening Tool  ASSESSMENT:   69 y.o. male with prior history of DM HTN HLD DKA Seizures Stroke presents to the hospital after a fall. Patient states that he was trying to get out of bed and tripped over the bed pole. He states that he felt pain in his ribs and he thought he may have broken them. He denies having any syncope. He states that he has been dehydrated because he has not been drinking enough fluids. Apparently his sugar was high around 230 this afternoon. It was measured to be around 600. His sister gave him 30 units of 70/30 insulin but he states he only takes 18 units of 70/30. In the ED he was started on IVF and he was noted to have a low blood glucose of 56 on the blood work drawn at 18:50. Patient is now feeling a little better. His initial blood pressure on presentation was noted to be 96/55 but was as low as 80/42 and now improved to 97/61. He has no fevers or chills noted either.  Pt seen for MST. BMI indicates normal weight status. No intakes documented. Pt reports he ate 75-100% of breakfast this AM which consisted of grits, applesauce, toast with jelly and butter, coffee, and juice. He denies abdominal pain or nausea with intakes.   PTA he was typically eating 4 meals/day. He states that it May he had to go live with his mother for a while and was eating differently during that time; he subsequently  states that he lost 14 lbs since May. Per chart review, pt has lost 9 lbs (6% body weight) since June which is not significant for time frame. A 14 lb weight loss since May would indicate 8.5% body weight loss in 5 months which is not significant for time frame. No muscle or fat wasting present.  He states he [facility] his dentures at [facility] and that he has been consuming softer foods at this time to account for this. He has had Ensure and Boost in the past and is interested in receiving them during this hospitalization. Expect pt to meet needs with meals and supplements.  Medications reviewed. Labs reviewed; CBGs: 84-162 mg/dL, BUN/creatinine elevated and trending up, Ca: 7.5 mg/dL, GFR: 21.   Diet Order:  Diet heart healthy/carb modified Room service appropriate?: Yes; Fluid consistency:: Thin  Skin:  Reviewed, no issues  Last BM:  10/26  Height:   Ht Readings from Last 1 Encounters:  08/08/15 5\' 9"  (1.753 m)    Weight:   Wt Readings from Last 1 Encounters:  08/08/15 149 lb 14.6 oz (68 kg)    Ideal Body Weight:  72.73 kg (kg)  BMI:  Body mass index is 22.13 kg/(m^2).  Estimated Nutritional Needs:   Kcal:  1400-1600  Protein:  65-75 grams  Fluid:  2-2.2 L/day  EDUCATION NEEDS:   No education needs identified at this time  Jarome Matin, RD, LDN Inpatient Clinical Dietitian Pager # [phone] After hours/weekend pager # [phone]

## 2015-08-08 NOTE — Progress Notes (Addendum)
[facility] PROGRESS NOTE    Progress Note   Jeromiah Copelin [id number] DOB: 05-21-1946 DOA: 08/07/2015 PCP: Mauricio Po, [name]   Brief Narrative:   Maruice Mitman is an 69 y.o. male who came in with fall hyperglycemia and dehydration.  Assessment/Plan:  Hypotension Secondary to hypovolemia: - BP improved with IV fluids and holding antihypertensive medications. - Unclear etiology likely hypovolemic.  Fall Leading to left lateral rib fracture: - Mechanical leading to left 6 rib fracture. - Continue pain control add MiraLAX. - Pain is not controlled start IV narcotics.  Acute-on-chronic kidney injury [facility]): - Baseline creatinine around 1.1. - This likely prerenal in the setting of antihypertensive medication and NSAID use.  - continue IV fluid hydration for 24 hours  Type II diabetes mellitus, uncontrolled ([place]): - Start him on sliding scale insulin, plus Levemir 10 units twice a day. - Check an A1c.  High anion gap Metabolic acidosis: Due to an elevated Lactic acid, this is likely prerenal. DC All antibiotics, He has remained afebrile with no leukocytosis. The elevation in lactic acid is likely due to his hypotensive episode. Bolus of normal saline and continued 100 mL an hour for 24 hours.  HLD (hyperlipidemia) - cont statins.   DVT Prophylaxis - Lovenox ordered.  Family Communication: none Disposition Plan: Home when stable. Code Status:     Code Status Orders        Start     Ordered   08/08/15 0037  Full code   Continuous     08/08/15 0037        IV Access:    Peripheral IV   Procedures and diagnostic studies:   Dg Ribs Unilateral W/chest Left  08/07/2015  CLINICAL DATA:  Fall, mid axillary rib pain as marked. EXAM: LEFT RIBS AND CHEST - 3+ VIEW COMPARISON:  Chest x-ray dated 07/05/2015. FINDINGS: Single view of the chest and four views of the left ribs are provided. There is a slightly displaced fracture of the left lateral fifth rib. No  other rib fracture seen. Cardiomediastinal silhouette remains normal in size and configuration. Lungs are clear. No pleural effusion seen. No pneumothorax seen. Lung volumes are normal. IMPRESSION: Slightly displaced fracture of the left lateral fifth rib. Electronically Signed   By: Franki Cabot M.D.   On: 08/07/2015 15:14   Ct Head Wo Contrast  08/07/2015  CLINICAL DATA:  Patient fell tonight at home while going to the bathroom. Complaining of left-sided chest wall pain. EXAM: CT HEAD WITHOUT CONTRAST CT CERVICAL SPINE WITHOUT CONTRAST TECHNIQUE: Multidetector CT imaging of the head and cervical spine was performed following the standard protocol without intravenous contrast. Multiplanar CT image reconstructions of the cervical spine were also generated. COMPARISON:  07/02/2015 FINDINGS: CT HEAD FINDINGS Ventricles are normal in size and configuration. There are no parenchymal masses or mass effect. There is no evidence of a cortical infarct. Mild periventricular white matter hypoattenuation is noted consistent with chronic microvascular ischemic change. There are no extra-axial masses or abnormal fluid collections. There is no intracranial hemorrhage. Small amount of dependent fluid is seen in the visualize left maxillary sinus. Remaining sinuses are clear as are the mastoid air cells. No skull fracture. CT CERVICAL SPINE FINDINGS No fracture. No spondylolisthesis. There is a reversal of the normal cervical lordosis. This is centered at C4. There is marked loss of disc height at C3-4 through C6-C7. Endplate spurring is noted at these levels. Facet joints are relatively well preserved. Bones are diffusely demineralized. Soft tissues are  unremarkable.  Lung apices are clear. IMPRESSION: HEAD CT: No acute intracranial abnormalities. Mild chronic microvascular ischemic change. Small air-fluid level in the visualize left maxillary sinus, nonspecific. This was present on the prior CT. It is likely inflammatory in  origin. CERVICAL CT:  No fracture or acute finding. Electronically Signed   By: Lajean Manes M.D.   On: 08/07/2015 20:49   Ct Cervical Spine Wo Contrast  08/07/2015  CLINICAL DATA:  Patient fell tonight at home while going to the bathroom. Complaining of left-sided chest wall pain. EXAM: CT HEAD WITHOUT CONTRAST CT CERVICAL SPINE WITHOUT CONTRAST TECHNIQUE: Multidetector CT imaging of the head and cervical spine was performed following the standard protocol without intravenous contrast. Multiplanar CT image reconstructions of the cervical spine were also generated. COMPARISON:  07/02/2015 FINDINGS: CT HEAD FINDINGS Ventricles are normal in size and configuration. There are no parenchymal masses or mass effect. There is no evidence of a cortical infarct. Mild periventricular white matter hypoattenuation is noted consistent with chronic microvascular ischemic change. There are no extra-axial masses or abnormal fluid collections. There is no intracranial hemorrhage. Small amount of dependent fluid is seen in the visualize left maxillary sinus. Remaining sinuses are clear as are the mastoid air cells. No skull fracture. CT CERVICAL SPINE FINDINGS No fracture. No spondylolisthesis. There is a reversal of the normal cervical lordosis. This is centered at C4. There is marked loss of disc height at C3-4 through C6-C7. Endplate spurring is noted at these levels. Facet joints are relatively well preserved. Bones are diffusely demineralized. Soft tissues are unremarkable.  Lung apices are clear. IMPRESSION: HEAD CT: No acute intracranial abnormalities. Mild chronic microvascular ischemic change. Small air-fluid level in the visualize left maxillary sinus, nonspecific. This was present on the prior CT. It is likely inflammatory in origin. CERVICAL CT:  No fracture or acute finding. Electronically Signed   By: Lajean Manes M.D.   On: 08/07/2015 20:49   Dg Chest Port 1 View  08/08/2015  CLINICAL DATA:  Sepsis. History  of diabetes, hypertension and stroke. EXAM: PORTABLE CHEST 1 VIEW COMPARISON:  Chest radiograph August 07, 2015 FINDINGS: Patchy RIGHT middle lobe airspace opacity. Strandy densities LEFT lung base. No pleural effusion. Cardiac silhouette is normal. Mildly tortuous, possibly ectatic aorta. Focal pleural thickening associated with LEFT rib fracture which was documented on yesterday's radiographs. IMPRESSION: RIGHT middle lobe atelectasis/ pneumonia. LEFT lung base atelectasis. Electronically Signed   By: Elon Alas M.D.   On: 08/08/2015 00:53     Medical Consultants:    None.  Anti-Infectives:   Anti-infectives    Start     Dose/Rate Route Frequency Ordered Stop   08/10/15 0400  vancomycin (VANCOCIN) IVPB 1000 mg/200 mL premix     1,000 mg 200 mL/hr over 60 Minutes Intravenous Every 48 hours 08/08/15 0544     08/08/15 1000  piperacillin-tazobactam (ZOSYN) IVPB 2.25 g     2.25 g 100 mL/hr over 30 Minutes Intravenous Every 6 hours 08/08/15 0543     08/08/15 0045  piperacillin-tazobactam (ZOSYN) IVPB 3.375 g     3.375 g 100 mL/hr over 30 Minutes Intravenous  Once 08/08/15 0036 08/08/15 0349   08/08/15 0045  vancomycin (VANCOCIN) IVPB 1000 mg/200 mL premix     1,000 mg 200 mL/hr over 60 Minutes Intravenous  Once 08/08/15 0036 08/08/15 [id number]      Subjective:    [name] [name] He related his pain is not controlled every time he takes a deep breat and a  normal breath his left side bothers him.  Objective:    Filed Vitals:   08/08/15 0420 08/08/15 0500 08/08/15 0600 08/08/15 0700  BP: 114/61  151/71 122/63  Pulse:  56 64 55  Temp:      TempSrc:      Resp:  19 29 18   Height: 5\' 9"  (1.753 m)     Weight: 68 kg (149 lb 14.6 oz)     SpO2:  99% 96% 100%    Intake/Output Summary (Last 24 hours) at 08/08/15 0812 Last data filed at 08/08/15 0700  Gross per 24 hour  Intake 2017.5 ml  Output      0 ml  Net 2017.5 ml   Filed Weights   08/08/15 0420  Weight: 68 kg (149 lb 14.6  oz)    Exam: Gen:  NAD Cardiovascular:  RRR, No M/R/G Chest and lungs:   The left side of his chest wall is tender to palpation he has good air movement and clear to auscultation. Abdomen:  Abdomen soft, NT/ND, + BS Extremities:  No C/E/C   Data Reviewed:    Labs: Basic Metabolic Panel:  Recent Labs Lab 08/07/15 1521 08/07/15 1850 08/08/15 0053  NA 136 139 136  K 3.8 3.7 4.4  CL 103 103 110  CO2 22  --  20*  GLUCOSE 139* 56* 99  BUN 34* 28* 31*  CREATININE 3.05* 2.90* 3.16*  CALCIUM 9.3  --  7.5*   GFR Estimated Creatinine Clearance: 20.9 mL/min (by C-G formula based on Cr of 3.16). Liver Function Tests:  Recent Labs Lab 08/07/15 1521 08/08/15 0053  AST 38 28  ALT 40 26  ALKPHOS 108 78  BILITOT 0.5 0.4  PROT 7.9 5.4*  ALBUMIN 4.3 2.8*   No results for input(s): LIPASE, AMYLASE in the last 168 hours. No results for input(s): AMMONIA in the last 168 hours. Coagulation profile  Recent Labs Lab 08/08/15 0053  INR 0.96    CBC:  Recent Labs Lab 08/07/15 1521 08/07/15 1850 08/08/15 0053 08/08/15 0645  WBC 10.0  --  7.5 7.1  NEUTROABS 5.7  --  3.6  --   HGB 11.7* 12.9* 9.0* 9.3*  HCT 35.8* 38.0* 27.2* 28.9*  MCV 92.0  --  92.2 93.5  PLT 208  --  178 181   Cardiac Enzymes:  Recent Labs Lab 08/08/15 0053 08/08/15 0645  TROPONINI <0.03 <0.03   BNP (last 3 results) No results for input(s): PROBNP in the last 8760 hours. CBG:  Recent Labs Lab 08/07/15 1822 08/08/15 0127 08/08/15 0510 08/08/15 0628 08/08/15 0725  GLUCAP 84 94 162* 156* 153*   D-Dimer: No results for input(s): DDIMER in the last 72 hours. Hgb A1c: No results for input(s): HGBA1C in the last 72 hours. Lipid Profile: No results for input(s): CHOL, HDL, LDLCALC, TRIG, CHOLHDL, LDLDIRECT in the last 72 hours. Thyroid function studies:  Recent Labs  08/08/15 0054  TSH 2.200   Anemia work up: No results for input(s): VITAMINB12, FOLATE, FERRITIN, TIBC, IRON,  RETICCTPCT in the last 72 hours. Sepsis Labs:  Recent Labs Lab 08/07/15 1521 08/07/15 2012 08/07/15 2013 08/08/15 0053 08/08/15 0445 08/08/15 0645  PROCALCITON  --   --   --  1.79  --   --   WBC 10.0  --   --  7.5  --  7.1  LATICACIDVEN  --  4.24* 1.94 1.3 2.7*  --    Microbiology Recent Results (from the past 240 hour(s))  Culture, blood (  x 2)     Status: None (Preliminary result)   Collection Time: 08/08/15 12:53 AM  Result Value Ref Range Status   Specimen Description   Final    BLOOD RIGHT ANTECUBITAL Performed at [facility]    Special Requests BOTTLES DRAWN AEROBIC AND ANAEROBIC 5CC  Final   Culture PENDING  Incomplete   Report Status PENDING  Incomplete  Culture, blood (x 2)     Status: None (Preliminary result)   Collection Time: 08/08/15 12:54 AM  Result Value Ref Range Status   Specimen Description BLOOD LEFT ARM Performed at [facility]   Final   Special Requests IN PEDIATRIC BOTTLE [facility]  Final   Culture PENDING  Incomplete   Report Status PENDING  Incomplete  MRSA PCR Screening     Status: None   Collection Time: 08/08/15  4:16 AM  Result Value Ref Range Status   MRSA by PCR NEGATIVE NEGATIVE Final    Comment:        The GeneXpert MRSA Assay (FDA approved for NASAL specimens only), is one component of a comprehensive MRSA colonization surveillance program. It is not intended to diagnose MRSA infection nor to guide or monitor treatment for MRSA infections.      Medications:   . aspirin EC  325 mg Oral Daily  . atorvastatin  10 mg Oral Daily  . FLUoxetine  20 mg Oral Daily  . folic acid  1 mg Oral Daily  . heparin  5,000 Units Subcutaneous 3 times per day  . insulin aspart  0-9 Units Subcutaneous 6 times per day  . multivitamin with minerals  1 tablet Oral Daily  . piperacillin-tazobactam (ZOSYN)  IV  2.25 g Intravenous Q6H  . sodium chloride  3 mL Intravenous Q12H  . thiamine  100 mg Oral Daily  . [START ON 08/10/2015]  vancomycin  1,000 mg Intravenous Q48H   Continuous Infusions: . dextrose 5 % and 0.9% NaCl 50 mL/hr at 08/08/15 0700    Time spent: 25 min   LOS: 1 day   Charlynne Cousins  [facility] Pager [phone]  *Please refer to [email or web address], password [email or web address] to get updated schedule on who will round on this patient, as hospitalists switch teams weekly. If 7PM-7AM, please contact night-coverage at [email or web address], password [email or web address] for any overnight needs.  08/08/2015, 8:12 AM

## 2015-08-09 ENCOUNTER — Ambulatory Visit: Payer: Medicare HMO | Admitting: Family

## 2015-08-09 DIAGNOSIS — N189 Chronic kidney disease, unspecified: Secondary | ICD-10-CM

## 2015-08-09 DIAGNOSIS — E1140 Type 2 diabetes mellitus with diabetic neuropathy, unspecified: Secondary | ICD-10-CM

## 2015-08-09 DIAGNOSIS — I129 Hypertensive chronic kidney disease with stage 1 through stage 4 chronic kidney disease, or unspecified chronic kidney disease: Secondary | ICD-10-CM | POA: Diagnosis not present

## 2015-08-09 DIAGNOSIS — E1165 Type 2 diabetes mellitus with hyperglycemia: Secondary | ICD-10-CM | POA: Diagnosis not present

## 2015-08-09 DIAGNOSIS — Z0289 Encounter for other administrative examinations: Secondary | ICD-10-CM

## 2015-08-09 DIAGNOSIS — R739 Hyperglycemia, unspecified: Secondary | ICD-10-CM

## 2015-08-09 LAB — BASIC METABOLIC PANEL
Anion gap: 7 (ref 5–15)
BUN: 18 mg/dL (ref 6–20)
CO2: 18 mmol/L — ABNORMAL LOW (ref 22–32)
Calcium: 7.8 mg/dL — ABNORMAL LOW (ref 8.9–10.3)
Chloride: 107 mmol/L (ref 101–111)
Creatinine, Ser: 1.32 mg/dL — ABNORMAL HIGH (ref 0.61–1.24)
GFR calc Af Amer: >60 mL/min (ref >60)
GFR calc non Af Amer: 53 mL/min — ABNORMAL LOW (ref >60)
Glucose, Bld: 176 mg/dL — ABNORMAL HIGH (ref 65–99)
Potassium: 4.9 mmol/L (ref 3.5–5.1)
Sodium: 132 mmol/L — ABNORMAL LOW (ref 135–145)

## 2015-08-09 LAB — GLUCOSE, CAPILLARY
Glucose-Capillary: 130 mg/dL — ABNORMAL HIGH (ref 65–99)
Glucose-Capillary: 146 mg/dL — ABNORMAL HIGH (ref 65–99)
Glucose-Capillary: 349 mg/dL — ABNORMAL HIGH (ref 65–99)
Glucose-Capillary: 104 mg/dL — ABNORMAL HIGH (ref 65–99)

## 2015-08-09 LAB — HEMOGLOBIN A1C
Hgb A1c MFr Bld: 13.3 % — ABNORMAL HIGH (ref 4.8–5.6)
Hgb A1c MFr Bld: 13.3 % — ABNORMAL HIGH (ref 4.8–5.6)
Mean Plasma Glucose: 335 mg/dL
Mean Plasma Glucose: 335 mg/dL

## 2015-08-09 MED ORDER — INSULIN DETEMIR 100 UNIT/ML ~~LOC~~ SOLN
10.0000 [IU] | Freq: Two times a day (BID) | SUBCUTANEOUS | Status: DC
  Administered 2015-08-09 – 2015-08-10 (×3): 10 [IU] via SUBCUTANEOUS
  Filled 2015-08-09 (×4): qty 0.1

## 2015-08-09 MED ORDER — ALPRAZOLAM 1 MG PO TABS
1.0000 mg | ORAL_TABLET | Freq: Two times a day (BID) | ORAL | Status: DC | PRN
  Administered 2015-08-09: 1 mg via ORAL
  Filled 2015-08-09: qty 1

## 2015-08-09 MED ORDER — OXYCODONE HCL 5 MG PO TABS
20.0000 mg | ORAL_TABLET | Freq: Two times a day (BID) | ORAL | Status: DC | PRN
  Administered 2015-08-09 – 2015-08-10 (×2): 20 mg via ORAL
  Filled 2015-08-09 (×2): qty 4

## 2015-08-09 NOTE — Care Management Note (Signed)
Case Management Note  Patient Details  Name: [name] [name] MRN: [id number] Date of Birth: 10/01/1946  Subjective/Objective:                   Fall Action/Plan:  Discharge planning Expected Discharge Date:   (unknown)               Expected Discharge Plan:  Home w Home Health Services  In-House Referral:  NA  Discharge planning Services  CM Consult  Post Acute Care Choice:  Home Health Choice offered to:  Patient  DME Arranged:  N/A DME Agency:     HH Arranged:  RN, Social Work HH Agency:  [facility]  Status of Service:  In process, will continue to follow  Medicare Important Message Given:    Date Medicare IM Given:    Medicare IM give by:    Date Additional Medicare IM Given:    Additional Medicare Important Message give by:     If discussed at Long Length of Stay Meetings, dates discussed:    Additional Comments: CM met with pt who states he is staying at [address] [place], [place] [phone]. Pt pledges to stay home-bound while active with home health agency and chooses [facility] for HH services.  Pt would benefit from HHRN for medication compliance and management and a social worker for community resources.  CM has requested orders.  Referral called to [facility] rep, Stephanie who states pt no longer active bc he would not be home and available for HH services to be rendered.  Pt agrees he will abide by [facility] parameters of HH if they will take hime back; Stephanie agrees to take him back with this pledge.  CM will continue to follow for orders.  ,  Christine, RN 08/09/2015, 1:44 PM  

## 2015-08-09 NOTE — Progress Notes (Signed)
[facility] PROGRESS NOTE    Progress Note   [name] [name] [id number] DOB: 1946/05/09 DOA: 08/07/2015 PCP: Mauricio Po, [name]   Brief Narrative:   [name] [name] is an 69 y.o. male who came in with fall, hyperglycemia and AKI  Assessment/Plan:  Hypotension Secondary to hypovolemia: - BP is high, cont to hold antihypertensive medications. - Etiology likely hypovolemic.  Fall Leading to left lateral rib fracture: - Mechanical leading to left 6 rib fracture. - Continue pain control add MiraLAX. - Increase orally narcotics.  Acute-on-chronic kidney injury [facility]): - Baseline creatinine around 1.1. - This likely prerenal in the setting of antihypertensive medication and NSAID use.  - continue IV fluid hydration,  basic met panel is pending.  Type II diabetes mellitus, uncontrolled ([place]): - Start him on sliding scale insulin, plus Levemir 10 units twice a day. - Check an A1c.  High anion gap Metabolic acidosis: Due to an elevated Lactic acid, this is likely prerenal. DC All antibiotics, He has remained afebrile with no leukocytosis. The elevation in lactic acid is likely due to his hypotensive episode. Resolved.  HLD (hyperlipidemia) - cont statins.   DVT Prophylaxis - Lovenox ordered.  Family Communication: none Disposition Plan: Home when stable. Code Status:     Code Status Orders        Start     Ordered   08/08/15 0037  Full code   Continuous     08/08/15 0037        IV Access:    Peripheral IV   Procedures and diagnostic studies:   Dg Ribs Unilateral W/chest Left  08/07/2015  CLINICAL DATA:  Fall, mid axillary rib pain as marked. EXAM: LEFT RIBS AND CHEST - 3+ VIEW COMPARISON:  Chest x-ray dated 07/05/2015. FINDINGS: Single view of the chest and four views of the left ribs are provided. There is a slightly displaced fracture of the left lateral fifth rib. No other rib fracture seen. Cardiomediastinal silhouette remains normal in size and  configuration. Lungs are clear. No pleural effusion seen. No pneumothorax seen. Lung volumes are normal. IMPRESSION: Slightly displaced fracture of the left lateral fifth rib. Electronically Signed   By: Franki Cabot M.D.   On: 08/07/2015 15:14   Ct Head Wo Contrast  08/07/2015  CLINICAL DATA:  Patient fell tonight at home while going to the bathroom. Complaining of left-sided chest wall pain. EXAM: CT HEAD WITHOUT CONTRAST CT CERVICAL SPINE WITHOUT CONTRAST TECHNIQUE: Multidetector CT imaging of the head and cervical spine was performed following the standard protocol without intravenous contrast. Multiplanar CT image reconstructions of the cervical spine were also generated. COMPARISON:  07/02/2015 FINDINGS: CT HEAD FINDINGS Ventricles are normal in size and configuration. There are no parenchymal masses or mass effect. There is no evidence of a cortical infarct. Mild periventricular white matter hypoattenuation is noted consistent with chronic microvascular ischemic change. There are no extra-axial masses or abnormal fluid collections. There is no intracranial hemorrhage. Small amount of dependent fluid is seen in the visualize left maxillary sinus. Remaining sinuses are clear as are the mastoid air cells. No skull fracture. CT CERVICAL SPINE FINDINGS No fracture. No spondylolisthesis. There is a reversal of the normal cervical lordosis. This is centered at C4. There is marked loss of disc height at C3-4 through C6-C7. Endplate spurring is noted at these levels. Facet joints are relatively well preserved. Bones are diffusely demineralized. Soft tissues are unremarkable.  Lung apices are clear. IMPRESSION: HEAD CT: No acute intracranial abnormalities. Mild chronic  microvascular ischemic change. Small air-fluid level in the visualize left maxillary sinus, nonspecific. This was present on the prior CT. It is likely inflammatory in origin. CERVICAL CT:  No fracture or acute finding. Electronically Signed   By:  Lajean Manes M.D.   On: 08/07/2015 20:49   Ct Cervical Spine Wo Contrast  08/07/2015  CLINICAL DATA:  Patient fell tonight at home while going to the bathroom. Complaining of left-sided chest wall pain. EXAM: CT HEAD WITHOUT CONTRAST CT CERVICAL SPINE WITHOUT CONTRAST TECHNIQUE: Multidetector CT imaging of the head and cervical spine was performed following the standard protocol without intravenous contrast. Multiplanar CT image reconstructions of the cervical spine were also generated. COMPARISON:  07/02/2015 FINDINGS: CT HEAD FINDINGS Ventricles are normal in size and configuration. There are no parenchymal masses or mass effect. There is no evidence of a cortical infarct. Mild periventricular white matter hypoattenuation is noted consistent with chronic microvascular ischemic change. There are no extra-axial masses or abnormal fluid collections. There is no intracranial hemorrhage. Small amount of dependent fluid is seen in the visualize left maxillary sinus. Remaining sinuses are clear as are the mastoid air cells. No skull fracture. CT CERVICAL SPINE FINDINGS No fracture. No spondylolisthesis. There is a reversal of the normal cervical lordosis. This is centered at C4. There is marked loss of disc height at C3-4 through C6-C7. Endplate spurring is noted at these levels. Facet joints are relatively well preserved. Bones are diffusely demineralized. Soft tissues are unremarkable.  Lung apices are clear. IMPRESSION: HEAD CT: No acute intracranial abnormalities. Mild chronic microvascular ischemic change. Small air-fluid level in the visualize left maxillary sinus, nonspecific. This was present on the prior CT. It is likely inflammatory in origin. CERVICAL CT:  No fracture or acute finding. Electronically Signed   By: Lajean Manes M.D.   On: 08/07/2015 20:49   Dg Chest Port 1 View  08/08/2015  CLINICAL DATA:  Sepsis. History of diabetes, hypertension and stroke. EXAM: PORTABLE CHEST 1 VIEW COMPARISON:   Chest radiograph August 07, 2015 FINDINGS: Patchy RIGHT middle lobe airspace opacity. Strandy densities LEFT lung base. No pleural effusion. Cardiac silhouette is normal. Mildly tortuous, possibly ectatic aorta. Focal pleural thickening associated with LEFT rib fracture which was documented on yesterday's radiographs. IMPRESSION: RIGHT middle lobe atelectasis/ pneumonia. LEFT lung base atelectasis. Electronically Signed   By: Elon Alas M.D.   On: 08/08/2015 00:53     Medical Consultants:    None.  Anti-Infectives:   Anti-infectives    Start     Dose/Rate Route Frequency Ordered Stop   08/10/15 0400  vancomycin (VANCOCIN) IVPB 1000 mg/200 mL premix  Status:  Discontinued     1,000 mg 200 mL/hr over 60 Minutes Intravenous Every 48 hours 08/08/15 0544 08/08/15 0832   08/08/15 1000  piperacillin-tazobactam (ZOSYN) IVPB 2.25 g  Status:  Discontinued     2.25 g 100 mL/hr over 30 Minutes Intravenous Every 6 hours 08/08/15 0543 08/08/15 0832   08/08/15 0045  piperacillin-tazobactam (ZOSYN) IVPB 3.375 g     3.375 g 100 mL/hr over 30 Minutes Intravenous  Once 08/08/15 0036 08/08/15 0349   08/08/15 0045  vancomycin (VANCOCIN) IVPB 1000 mg/200 mL premix     1,000 mg 200 mL/hr over 60 Minutes Intravenous  Once 08/08/15 0036 08/08/15 0612      Subjective:    [name] [name] pain seems to be control.  Objective:    Filed Vitals:   08/09/15 0300 08/09/15 0400 08/09/15 0500 08/09/15 0600  BP: 157/77 184/90 160/76 175/84  Pulse: 58 58 58 58  Temp:  99 F (37.2 C)    TempSrc:  Oral    Resp: '19 20 19 13  ' Height:      Weight:      SpO2: 99% 99% 98% 99%    Intake/Output Summary (Last 24 hours) at 08/09/15 0751 Last data filed at 08/09/15 0700  Gross per 24 hour  Intake   2705 ml  Output   3225 ml  Net   -520 ml   Filed Weights   08/08/15 0420 08/09/15 0015  Weight: 68 kg (149 lb 14.6 oz) 71.9 kg (158 lb 8.2 oz)    Exam: Gen:  NAD Cardiovascular:  RRR, No M/R/G Chest and  lungs:   The left side of his chest wall is tender to palpation he has good air movement and clear to auscultation. Abdomen:  Abdomen soft, NT/ND, + BS Extremities:  No C/E/C   Data Reviewed:    Labs: Basic Metabolic Panel:  Recent Labs Lab 08/07/15 1521 08/07/15 1850 08/08/15 0053  NA 136 139 136  K 3.8 3.7 4.4  CL 103 103 110  CO2 22  --  20*  GLUCOSE 139* 56* 99  BUN 34* 28* 31*  CREATININE 3.05* 2.90* 3.16*  CALCIUM 9.3  --  7.5*   GFR Estimated Creatinine Clearance: 21.8 mL/min (by C-G formula based on Cr of 3.16). Liver Function Tests:  Recent Labs Lab 08/07/15 1521 08/08/15 0053  AST 38 28  ALT 40 26  ALKPHOS 108 78  BILITOT 0.5 0.4  PROT 7.9 5.4*  ALBUMIN 4.3 2.8*   No results for input(s): LIPASE, AMYLASE in the last 168 hours. No results for input(s): AMMONIA in the last 168 hours. Coagulation profile  Recent Labs Lab 08/08/15 0053  INR 0.96    CBC:  Recent Labs Lab 08/07/15 1521 08/07/15 1850 08/08/15 0053 08/08/15 0645  WBC 10.0  --  7.5 7.1  NEUTROABS 5.7  --  3.6  --   HGB 11.7* 12.9* 9.0* 9.3*  HCT 35.8* 38.0* 27.2* 28.9*  MCV 92.0  --  92.2 93.5  PLT 208  --  178 181   Cardiac Enzymes:  Recent Labs Lab 08/08/15 0053 08/08/15 0645  TROPONINI <0.03 <0.03   BNP (last 3 results) No results for input(s): PROBNP in the last 8760 hours. CBG:  Recent Labs Lab 08/08/15 0628 08/08/15 0725 08/08/15 1121 08/08/15 1624 08/08/15 2113  GLUCAP 156* 153* 259* 123* 194*   D-Dimer: No results for input(s): DDIMER in the last 72 hours. Hgb A1c:  Recent Labs  08/08/15 0053 08/08/15 0645  HGBA1C 13.3* 13.3*   Lipid Profile:  Recent Labs  08/08/15 0053  CHOL 134  HDL 46  LDLCALC 55  TRIG 163*  CHOLHDL 2.9   Thyroid function studies:  Recent Labs  08/08/15 0054  TSH 2.200   Anemia work up: No results for input(s): VITAMINB12, FOLATE, FERRITIN, TIBC, IRON, RETICCTPCT in the last 72 hours. Sepsis Labs:  Recent  Labs Lab 08/07/15 1521 08/07/15 2012 08/07/15 2013 08/08/15 0053 08/08/15 0445 08/08/15 0645  PROCALCITON  --   --   --  1.79  --   --   WBC 10.0  --   --  7.5  --  7.1  LATICACIDVEN  --  4.24* 1.94 1.3 2.7*  --    Microbiology Recent Results (from the past 240 hour(s))  Culture, blood (x 2)     Status: None (Preliminary  result)   Collection Time: 08/08/15 12:53 AM  Result Value Ref Range Status   Specimen Description   Final    BLOOD RIGHT ANTECUBITAL Performed at [facility]    Special Requests BOTTLES DRAWN AEROBIC AND ANAEROBIC 5CC  Final   Culture PENDING  Incomplete   Report Status PENDING  Incomplete  Culture, blood (x 2)     Status: None (Preliminary result)   Collection Time: 08/08/15 12:54 AM  Result Value Ref Range Status   Specimen Description BLOOD LEFT ARM Performed at [facility]   Final   Special Requests IN PEDIATRIC BOTTLE [facility]  Final   Culture PENDING  Incomplete   Report Status PENDING  Incomplete  MRSA PCR Screening     Status: None   Collection Time: 08/08/15  4:16 AM  Result Value Ref Range Status   MRSA by PCR NEGATIVE NEGATIVE Final    Comment:        The GeneXpert MRSA Assay (FDA approved for NASAL specimens only), is one component of a comprehensive MRSA colonization surveillance program. It is not intended to diagnose MRSA infection nor to guide or monitor treatment for MRSA infections.      Medications:   . aspirin EC  81 mg Oral Daily  . atorvastatin  10 mg Oral Daily  . feeding supplement (ENSURE ENLIVE)  237 mL Oral BID BM  . FLUoxetine  20 mg Oral Daily  . folic acid  1 mg Oral Daily  . heparin  5,000 Units Subcutaneous 3 times per day  . insulin aspart  0-5 Units Subcutaneous QHS  . insulin aspart  0-9 Units Subcutaneous TID WC  . insulin aspart  3 Units Subcutaneous TID WC  . insulin detemir  10 Units Subcutaneous BID  . multivitamin with minerals  1 tablet Oral Daily  . polyethylene glycol  17 g Oral  Daily  . sodium chloride  3 mL Intravenous Q12H  . thiamine  100 mg Oral Daily   Continuous Infusions: . sodium chloride 75 mL/hr at 08/09/15 0700    Time spent: 25 min   LOS: 2 days   Charlynne Cousins  [facility] Pager [phone]  *Please refer to [email or web address], password [email or web address] to get updated schedule on who will round on this patient, as hospitalists switch teams weekly. If 7PM-7AM, please contact night-coverage at [email or web address], password [email or web address] for any overnight needs.  08/09/2015, 7:51 AM

## 2015-08-10 LAB — GLUCOSE, CAPILLARY
Glucose-Capillary: 95 mg/dL (ref 65–99)
Glucose-Capillary: 269 mg/dL — ABNORMAL HIGH (ref 65–99)

## 2015-08-10 LAB — BASIC METABOLIC PANEL
Anion gap: 7 (ref 5–15)
BUN: 17 mg/dL (ref 6–20)
CO2: 25 mmol/L (ref 22–32)
Calcium: 8.5 mg/dL — ABNORMAL LOW (ref 8.9–10.3)
Chloride: 108 mmol/L (ref 101–111)
Creatinine, Ser: 1.01 mg/dL (ref 0.61–1.24)
GFR calc Af Amer: >60 mL/min (ref >60)
GFR calc non Af Amer: >60 mL/min (ref >60)
Glucose, Bld: 44 mg/dL — CL (ref 65–99)
Potassium: 4.2 mmol/L (ref 3.5–5.1)
Sodium: 140 mmol/L (ref 135–145)

## 2015-08-10 MED ORDER — INSULIN ASPART PROT & ASPART (70-30 MIX) 100 UNIT/ML ~~LOC~~ SUSP: 22.0000 [IU] | Freq: Two times a day (BID) | SUBCUTANEOUS | Status: DC

## 2015-08-10 MED ORDER — RAMIPRIL 10 MG PO CAPS: 10.0000 mg | ORAL_CAPSULE | Freq: Every day | ORAL | Status: DC

## 2015-08-10 MED ORDER — HYDRALAZINE HCL 20 MG/ML IJ SOLN
10.0000 mg | Freq: Four times a day (QID) | INTRAMUSCULAR | Status: DC | PRN
  Administered 2015-08-10: 10 mg via INTRAVENOUS
  Filled 2015-08-10: qty 1

## 2015-08-10 NOTE — Progress Notes (Signed)
Patient discharged.  Room air.  Leaving with prescription.  Leaving with brother-in-law.  Room air.  Home Health will see patient at home per Case Management.  Refused flu vaccine.  No s/s of pain or distress.  No complaints.

## 2015-08-10 NOTE — Care Management Important Message (Signed)
Important Message  Patient Details  Name: [name] [name] MRN: [id number] Date of Birth: [birth year]   Medicare Important Message Given:  Yes-second notification given    Shelda Altes 08/10/2015, 3:48 [place] Message  Patient Details  Name: [name] [name] MRN: [id number] Date of Birth: Mar 19, 1946   Medicare Important Message Given:  Yes-second notification given    Shelda Altes 08/10/2015, 3:47 PM

## 2015-08-10 NOTE — Discharge Summary (Signed)
Physician Discharge Summary  [name] [name] XIP:[id number] DOB: 1946/02/27 DOA: 08/07/2015  PCP: Mauricio Po, [name]  Admit date: 08/07/2015 Discharge date: 08/10/2015  Time spent: 35  minutes  Recommendations for Outpatient Follow-up:  1. Follow-up with PCP in 2 weeks should've basic metabolic panel, make sure he has restarted his ACE inhibitor.  Discharge Diagnoses:  Active Problems:   Acute-on-chronic kidney injury ([place])   Type II diabetes mellitus, uncontrolled ([place])   Fall   Type 2 diabetes mellitus with other circulatory complications ([place])   HLD (hyperlipidemia)   Hypotension   Discharge Condition: stable  Diet recommendation: ADA diet  Filed Weights   08/08/15 0420 08/09/15 0015  Weight: 68 kg (149 lb 14.6 oz) 71.9 kg (158 lb 8.2 oz)    History of present illness:  69 year old male with past medical history of diabetes, hypertension history of stroke and seizures represents after fall, he relates he tripped over his shoes at home and fell to the floor. He will additionally have been running high come to the emergency room his blood sugar was checked and it was over 600.  Hospital Course:  Hypotension secondary to hypovolemia: Multifactorial due to his antihypertensive medication and high blood glucose. He was started on IV fluids and does resolve with IV fluid hydration.  Mechanical fall leading to left lateral rib fracture: X-ray was done that showed fracture of the sixth rib, he was started on narcotics and MiraLAX and this controlled his pain.  Acute kidney injury on chronic renal disease: His baseline creatinine is around 1.1, he was started on IV hydration his his antihypertensive medications were held his creatinine returned to baseline with IV hydration.  Uncontrolled diabetes mellitus type 2: His 70/30 was stopped, he was switched to Lantus test done with a sliding scale insulin his hemoglobin A1c was 13.30 follow-up with PCP for further titration of his  insulin. His insulin was increased.  High anion gap metabolic acidosis: He had elevated lactic acid likely due to prerenal azotemia. All antibiotics were stopped. And afebrile without leukocytosis. Is given aggressive IV fluid hydration and does resolve within 24 hours.  Procedures:  CT C-spine  CT of the head  Chest x-ray  Right ribs unilateral x-ray  Consultations:  none  Discharge Exam: Filed Vitals:   08/10/15 0947  BP: 174/76  Pulse: 64  Temp: 98.5 F (36.9 C)  Resp: 20    General: A&O x3 Cardiovascular: RRR Respiratory: good air movement CTA B/L  Discharge Instructions   Discharge Instructions    Diet - low sodium heart healthy    Complete by:  As directed      Increase activity slowly    Complete by:  As directed           Current Discharge Medication List    CONTINUE these medications which have CHANGED   Details  ramipril (ALTACE) 10 MG capsule Take 1 capsule (10 mg total) by mouth daily.      CONTINUE these medications which have NOT CHANGED   Details  alprazolam (XANAX) 2 MG tablet Take 1 mg by mouth 2 (two) times daily as needed.    amLODipine (NORVASC) 5 MG tablet Take 1 tablet (5 mg total) by mouth daily. Qty: 30 tablet, Refills: 3    aspirin EC 325 MG tablet Take 1 tablet (325 mg total) by mouth daily. Qty: 90 tablet, Refills: 3   Associated Diagnoses: Stroke ([place])    atorvastatin (LIPITOR) 10 MG tablet Take 1 tablet (10 mg total) by  mouth daily at 6 PM. Qty: 30 tablet, Refills: 0    FLUoxetine (PROZAC) 20 MG capsule Take 20 mg by mouth daily.    folic acid (FOLVITE) 1 MG tablet Take 1 mg by mouth daily.    insulin aspart protamine- aspart (NOVOLOG MIX 70/30) (70-30) 100 UNIT/ML injection Inject 0.18 mLs (18 Units total) into the skin 2 (two) times daily. Qty: 10 mL, Refills: 11    Oxycodone HCl 20 MG TABS Take 20 mg by mouth 2 (two) times daily as needed (pain).    Associated Diagnoses: Stroke ([place])    PRESCRIPTION  MEDICATION Place 1 Dose into the right eye every 30 (thirty) days. Eye injection    blood glucose meter kit and supplies Dispense based on patient and insurance preference. Use up to four times daily as directed. (FOR ICD-9 250.00, 250.01). Qty: 1 each, Refills: 0       No Known Allergies Follow-up Information    Follow up with Mauricio Po, [name] In 2 weeks.   Specialty:  Family Medicine   Why:  Hospital follow-up   Contact information:   [address] [address] [phone]        The results of significant diagnostics from this hospitalization (including imaging, microbiology, ancillary and laboratory) are listed below for reference.    Significant Diagnostic Studies: Dg Ribs Unilateral W/chest Left  08/07/2015  CLINICAL DATA:  Fall, mid axillary rib pain as marked. EXAM: LEFT RIBS AND CHEST - 3+ VIEW COMPARISON:  Chest x-ray dated 07/05/2015. FINDINGS: Single view of the chest and four views of the left ribs are provided. There is a slightly displaced fracture of the left lateral fifth rib. No other rib fracture seen. Cardiomediastinal silhouette remains normal in size and configuration. Lungs are clear. No pleural effusion seen. No pneumothorax seen. Lung volumes are normal. IMPRESSION: Slightly displaced fracture of the left lateral fifth rib. Electronically Signed   By: Franki Cabot M.D.   On: 08/07/2015 15:14   Ct Head Wo Contrast  08/07/2015  CLINICAL DATA:  Patient fell tonight at home while going to the bathroom. Complaining of left-sided chest wall pain. EXAM: CT HEAD WITHOUT CONTRAST CT CERVICAL SPINE WITHOUT CONTRAST TECHNIQUE: Multidetector CT imaging of the head and cervical spine was performed following the standard protocol without intravenous contrast. Multiplanar CT image reconstructions of the cervical spine were also generated. COMPARISON:  07/02/2015 FINDINGS: CT HEAD FINDINGS Ventricles are normal in size and configuration. There are no parenchymal  masses or mass effect. There is no evidence of a cortical infarct. Mild periventricular white matter hypoattenuation is noted consistent with chronic microvascular ischemic change. There are no extra-axial masses or abnormal fluid collections. There is no intracranial hemorrhage. Small amount of dependent fluid is seen in the visualize left maxillary sinus. Remaining sinuses are clear as are the mastoid air cells. No skull fracture. CT CERVICAL SPINE FINDINGS No fracture. No spondylolisthesis. There is a reversal of the normal cervical lordosis. This is centered at C4. There is marked loss of disc height at C3-4 through C6-C7. Endplate spurring is noted at these levels. Facet joints are relatively well preserved. Bones are diffusely demineralized. Soft tissues are unremarkable.  Lung apices are clear. IMPRESSION: HEAD CT: No acute intracranial abnormalities. Mild chronic microvascular ischemic change. Small air-fluid level in the visualize left maxillary sinus, nonspecific. This was present on the prior CT. It is likely inflammatory in origin. CERVICAL CT:  No fracture or acute finding. Electronically Signed   By:  Lajean Manes M.D.   On: 08/07/2015 20:49   Ct Cervical Spine Wo Contrast  08/07/2015  CLINICAL DATA:  Patient fell tonight at home while going to the bathroom. Complaining of left-sided chest wall pain. EXAM: CT HEAD WITHOUT CONTRAST CT CERVICAL SPINE WITHOUT CONTRAST TECHNIQUE: Multidetector CT imaging of the head and cervical spine was performed following the standard protocol without intravenous contrast. Multiplanar CT image reconstructions of the cervical spine were also generated. COMPARISON:  07/02/2015 FINDINGS: CT HEAD FINDINGS Ventricles are normal in size and configuration. There are no parenchymal masses or mass effect. There is no evidence of a cortical infarct. Mild periventricular white matter hypoattenuation is noted consistent with chronic microvascular ischemic change. There are no  extra-axial masses or abnormal fluid collections. There is no intracranial hemorrhage. Small amount of dependent fluid is seen in the visualize left maxillary sinus. Remaining sinuses are clear as are the mastoid air cells. No skull fracture. CT CERVICAL SPINE FINDINGS No fracture. No spondylolisthesis. There is a reversal of the normal cervical lordosis. This is centered at C4. There is marked loss of disc height at C3-4 through C6-C7. Endplate spurring is noted at these levels. Facet joints are relatively well preserved. Bones are diffusely demineralized. Soft tissues are unremarkable.  Lung apices are clear. IMPRESSION: HEAD CT: No acute intracranial abnormalities. Mild chronic microvascular ischemic change. Small air-fluid level in the visualize left maxillary sinus, nonspecific. This was present on the prior CT. It is likely inflammatory in origin. CERVICAL CT:  No fracture or acute finding. Electronically Signed   By: Lajean Manes M.D.   On: 08/07/2015 20:49   Dg Chest Port 1 View  08/08/2015  CLINICAL DATA:  Sepsis. History of diabetes, hypertension and stroke. EXAM: PORTABLE CHEST 1 VIEW COMPARISON:  Chest radiograph August 07, 2015 FINDINGS: Patchy RIGHT middle lobe airspace opacity. Strandy densities LEFT lung base. No pleural effusion. Cardiac silhouette is normal. Mildly tortuous, possibly ectatic aorta. Focal pleural thickening associated with LEFT rib fracture which was documented on yesterday's radiographs. IMPRESSION: RIGHT middle lobe atelectasis/ pneumonia. LEFT lung base atelectasis. Electronically Signed   By: Elon Alas M.D.   On: 08/08/2015 00:53    Microbiology: Recent Results (from the past 240 hour(s))  Culture, blood (x 2)     Status: None (Preliminary result)   Collection Time: 08/08/15 12:53 AM  Result Value Ref Range Status   Specimen Description BLOOD RIGHT ANTECUBITAL  Final   Special Requests BOTTLES DRAWN AEROBIC AND ANAEROBIC 5CC  Final   Culture   Final     NO GROWTH 1 DAY Performed at [facility]    Report Status PENDING  Incomplete  Culture, blood (x 2)     Status: None (Preliminary result)   Collection Time: 08/08/15 12:54 AM  Result Value Ref Range Status   Specimen Description BLOOD LEFT ARM  Final   Special Requests IN PEDIATRIC BOTTLE 3CC  Final   Culture   Final    NO GROWTH 1 DAY Performed at [facility]    Report Status PENDING  Incomplete  MRSA PCR Screening     Status: None   Collection Time: 08/08/15  4:16 AM  Result Value Ref Range Status   MRSA by PCR NEGATIVE NEGATIVE Final    Comment:        The GeneXpert MRSA Assay (FDA approved for NASAL specimens only), is one component of a comprehensive MRSA colonization surveillance program. It is not intended to diagnose MRSA infection nor  to guide or monitor treatment for MRSA infections.      Labs: Basic Metabolic Panel:  Recent Labs Lab 08/07/15 1521 08/07/15 1850 08/08/15 0053 08/09/15 0810 08/10/15 0550  NA 136 139 136 132* 140  K 3.8 3.7 4.4 4.9 4.2  CL 103 103 110 107 108  CO2 22  --  20* 18* 25  GLUCOSE 139* 56* 99 176* 44*  BUN 34* 28* 31* 18 17  CREATININE 3.05* 2.90* 3.16* 1.32* 1.01  CALCIUM 9.3  --  7.5* 7.8* 8.5*   Liver Function Tests:  Recent Labs Lab 08/07/15 1521 08/08/15 0053  AST 38 28  ALT 40 26  ALKPHOS 108 78  BILITOT 0.5 0.4  PROT 7.9 5.4*  ALBUMIN 4.3 2.8*   No results for input(s): LIPASE, AMYLASE in the last 168 hours. No results for input(s): AMMONIA in the last 168 hours. CBC:  Recent Labs Lab 08/07/15 1521 08/07/15 1850 08/08/15 0053 08/08/15 0645  WBC 10.0  --  7.5 7.1  NEUTROABS 5.7  --  3.6  --   HGB 11.7* 12.9* 9.0* 9.3*  HCT 35.8* 38.0* 27.2* 28.9*  MCV 92.0  --  92.2 93.5  PLT 208  --  178 181   Cardiac Enzymes:  Recent Labs Lab 08/08/15 0053 08/08/15 0645  TROPONINI <0.03 <0.03   BNP: BNP (last 3 results) No results for input(s): BNP in the last 8760 hours.  ProBNP  (last 3 results) No results for input(s): PROBNP in the last 8760 hours.  CBG:  Recent Labs Lab 08/09/15 1211 08/09/15 1644 08/09/15 2213 08/10/15 0748 08/10/15 1116  GLUCAP 146* 349* 104* 95 269*     Signed:  FELIZ ORTIZ, Welda Azzarello  [facility] 08/10/2015, 12:58 PM

## 2015-08-12 ENCOUNTER — Inpatient Hospital Stay (HOSPITAL_COMMUNITY)
Admission: EM | Admit: 2015-08-12 | Discharge: 2015-08-14 | DRG: 637 | Disposition: A | Discharge status: Home Health | Payer: Medicare HMO | Attending: Internal Medicine | Admitting: Internal Medicine

## 2015-08-12 ENCOUNTER — Encounter (HOSPITAL_COMMUNITY): Payer: Self-pay | Admitting: *Deleted

## 2015-08-12 DIAGNOSIS — I129 Hypertensive chronic kidney disease with stage 1 through stage 4 chronic kidney disease, or unspecified chronic kidney disease: Secondary | ICD-10-CM | POA: Diagnosis present

## 2015-08-12 DIAGNOSIS — D638 Anemia in other chronic diseases classified elsewhere: Secondary | ICD-10-CM | POA: Diagnosis present

## 2015-08-12 DIAGNOSIS — F0390 Unspecified dementia without behavioral disturbance: Secondary | ICD-10-CM | POA: Diagnosis present

## 2015-08-12 DIAGNOSIS — F17210 Nicotine dependence, cigarettes, uncomplicated: Secondary | ICD-10-CM | POA: Diagnosis present

## 2015-08-12 DIAGNOSIS — I10 Essential (primary) hypertension: Secondary | ICD-10-CM | POA: Diagnosis present

## 2015-08-12 DIAGNOSIS — Z8249 Family history of ischemic heart disease and other diseases of the circulatory system: Secondary | ICD-10-CM

## 2015-08-12 DIAGNOSIS — F329 Major depressive disorder, single episode, unspecified: Secondary | ICD-10-CM | POA: Diagnosis present

## 2015-08-12 DIAGNOSIS — E1121 Type 2 diabetes mellitus with diabetic nephropathy: Secondary | ICD-10-CM | POA: Diagnosis present

## 2015-08-12 DIAGNOSIS — E1122 Type 2 diabetes mellitus with diabetic chronic kidney disease: Secondary | ICD-10-CM | POA: Diagnosis present

## 2015-08-12 DIAGNOSIS — Z7982 Long term (current) use of aspirin: Secondary | ICD-10-CM

## 2015-08-12 DIAGNOSIS — Z79891 Long term (current) use of opiate analgesic: Secondary | ICD-10-CM | POA: Diagnosis not present

## 2015-08-12 DIAGNOSIS — Z96641 Presence of right artificial hip joint: Secondary | ICD-10-CM | POA: Diagnosis present

## 2015-08-12 DIAGNOSIS — Z79899 Other long term (current) drug therapy: Secondary | ICD-10-CM | POA: Diagnosis not present

## 2015-08-12 DIAGNOSIS — E1100 Type 2 diabetes mellitus with hyperosmolarity without nonketotic hyperglycemic-hyperosmolar coma (NKHHC): Secondary | ICD-10-CM | POA: Diagnosis present

## 2015-08-12 DIAGNOSIS — E1165 Type 2 diabetes mellitus with hyperglycemia: Secondary | ICD-10-CM

## 2015-08-12 DIAGNOSIS — I7389 Other specified peripheral vascular diseases: Secondary | ICD-10-CM | POA: Diagnosis present

## 2015-08-12 DIAGNOSIS — I639 Cerebral infarction, unspecified: Secondary | ICD-10-CM | POA: Diagnosis present

## 2015-08-12 DIAGNOSIS — N183 Chronic kidney disease, stage 3 unspecified: Secondary | ICD-10-CM | POA: Diagnosis present

## 2015-08-12 DIAGNOSIS — IMO0002 Reserved for concepts with insufficient information to code with codable children: Secondary | ICD-10-CM

## 2015-08-12 DIAGNOSIS — R569 Unspecified convulsions: Secondary | ICD-10-CM | POA: Diagnosis present

## 2015-08-12 DIAGNOSIS — E1140 Type 2 diabetes mellitus with diabetic neuropathy, unspecified: Secondary | ICD-10-CM | POA: Diagnosis present

## 2015-08-12 DIAGNOSIS — I63331 Cerebral infarction due to thrombosis of right posterior cerebral artery: Secondary | ICD-10-CM | POA: Diagnosis not present

## 2015-08-12 DIAGNOSIS — E1169 Type 2 diabetes mellitus with other specified complication: Secondary | ICD-10-CM

## 2015-08-12 DIAGNOSIS — R739 Hyperglycemia, unspecified: Secondary | ICD-10-CM

## 2015-08-12 DIAGNOSIS — G9340 Encephalopathy, unspecified: Secondary | ICD-10-CM | POA: Diagnosis present

## 2015-08-12 DIAGNOSIS — Z8673 Personal history of transient ischemic attack (TIA), and cerebral infarction without residual deficits: Secondary | ICD-10-CM | POA: Diagnosis not present

## 2015-08-12 DIAGNOSIS — R410 Disorientation, unspecified: Secondary | ICD-10-CM | POA: Diagnosis not present

## 2015-08-12 DIAGNOSIS — Z794 Long term (current) use of insulin: Secondary | ICD-10-CM | POA: Diagnosis not present

## 2015-08-12 DIAGNOSIS — Z833 Family history of diabetes mellitus: Secondary | ICD-10-CM | POA: Diagnosis not present

## 2015-08-12 DIAGNOSIS — E785 Hyperlipidemia, unspecified: Secondary | ICD-10-CM | POA: Diagnosis present

## 2015-08-12 DIAGNOSIS — F32A Depression, unspecified: Secondary | ICD-10-CM | POA: Diagnosis present

## 2015-08-12 DIAGNOSIS — I6621 Occlusion and stenosis of right posterior cerebral artery: Secondary | ICD-10-CM | POA: Diagnosis present

## 2015-08-12 DIAGNOSIS — Z9114 Patient's other noncompliance with medication regimen: Secondary | ICD-10-CM

## 2015-08-12 DIAGNOSIS — Z820 Family history of epilepsy and other diseases of the nervous system: Secondary | ICD-10-CM

## 2015-08-12 DIAGNOSIS — D72829 Elevated white blood cell count, unspecified: Secondary | ICD-10-CM

## 2015-08-12 LAB — MAGNESIUM: Magnesium: 2.3 mg/dL (ref 1.7–2.4)

## 2015-08-12 LAB — COMPREHENSIVE METABOLIC PANEL
ALT: 33 U/L (ref 17–63)
ALT: 29 U/L (ref 17–63)
AST: 35 U/L (ref 15–41)
AST: 29 U/L (ref 15–41)
Albumin: 3.8 g/dL (ref 3.5–5.0)
Albumin: 3.4 g/dL — ABNORMAL LOW (ref 3.5–5.0)
Alkaline Phosphatase: 93 U/L (ref 38–126)
Alkaline Phosphatase: 83 U/L (ref 38–126)
Anion gap: 15 (ref 5–15)
Anion gap: 11 (ref 5–15)
BUN: 34 mg/dL — ABNORMAL HIGH (ref 6–20)
BUN: 33 mg/dL — ABNORMAL HIGH (ref 6–20)
CO2: 21 mmol/L — ABNORMAL LOW (ref 22–32)
CO2: 22 mmol/L (ref 22–32)
Calcium: 9.2 mg/dL (ref 8.9–10.3)
Calcium: 8.8 mg/dL — ABNORMAL LOW (ref 8.9–10.3)
Chloride: 94 mmol/L — ABNORMAL LOW (ref 101–111)
Chloride: 102 mmol/L (ref 101–111)
Creatinine, Ser: 2 mg/dL — ABNORMAL HIGH (ref 0.61–1.24)
Creatinine, Ser: 1.6 mg/dL — ABNORMAL HIGH (ref 0.61–1.24)
GFR calc Af Amer: 37 mL/min — ABNORMAL LOW (ref >60)
GFR calc Af Amer: 49 mL/min — ABNORMAL LOW (ref >60)
GFR calc non Af Amer: 32 mL/min — ABNORMAL LOW (ref >60)
GFR calc non Af Amer: 42 mL/min — ABNORMAL LOW (ref >60)
Glucose, Bld: 476 mg/dL — ABNORMAL HIGH (ref 65–99)
Glucose, Bld: 151 mg/dL — ABNORMAL HIGH (ref 65–99)
Potassium: 4.2 mmol/L (ref 3.5–5.1)
Potassium: 3.6 mmol/L (ref 3.5–5.1)
Sodium: 130 mmol/L — ABNORMAL LOW (ref 135–145)
Sodium: 135 mmol/L (ref 135–145)
Total Bilirubin: 0.4 mg/dL (ref 0.3–1.2)
Total Bilirubin: 0.4 mg/dL (ref 0.3–1.2)
Total Protein: 7 g/dL (ref 6.5–8.1)
Total Protein: 6.4 g/dL — ABNORMAL LOW (ref 6.5–8.1)

## 2015-08-12 LAB — BLOOD GAS, VENOUS
Acid-base deficit: 3.6 mmol/L — ABNORMAL HIGH (ref 0.0–2.0)
Bicarbonate: 20.2 mEq/L (ref 20.0–24.0)
Drawn by: 295031
FIO2: 0.21
O2 Saturation: 69.4 %
Patient temperature: 98.6
TCO2: 18.8 mmol/L (ref 0–100)
pCO2, Ven: 33.7 mmHg — ABNORMAL LOW (ref 45.0–50.0)
pH, Ven: 7.395 — ABNORMAL HIGH (ref 7.250–7.300)
pO2, Ven: 36.3 mmHg (ref 30.0–45.0)

## 2015-08-12 LAB — CBC WITH DIFFERENTIAL/PLATELET
Basophils Absolute: 0 10*3/uL (ref 0.0–0.1)
Basophils Relative: 0 %
Eosinophils Absolute: 0.2 10*3/uL (ref 0.0–0.7)
Eosinophils Relative: 1 %
HCT: 29 % — ABNORMAL LOW (ref 39.0–52.0)
Hemoglobin: 10.1 g/dL — ABNORMAL LOW (ref 13.0–17.0)
Lymphocytes Relative: 16 %
Lymphs Abs: 2.3 10*3/uL (ref 0.7–4.0)
MCH: 30.3 pg (ref 26.0–34.0)
MCHC: 34.8 g/dL (ref 30.0–36.0)
MCV: 87.1 fL (ref 78.0–100.0)
Monocytes Absolute: 1.3 10*3/uL — ABNORMAL HIGH (ref 0.1–1.0)
Monocytes Relative: 9 %
Neutro Abs: 10.6 10*3/uL — ABNORMAL HIGH (ref 1.7–7.7)
Neutrophils Relative %: 74 %
Platelets: 276 10*3/uL (ref 150–400)
RBC: 3.33 MIL/uL — ABNORMAL LOW (ref 4.22–5.81)
RDW: 13.1 % (ref 11.5–15.5)
WBC: 14.3 10*3/uL — ABNORMAL HIGH (ref 4.0–10.5)

## 2015-08-12 LAB — GLUCOSE, CAPILLARY
Glucose-Capillary: 119 mg/dL — ABNORMAL HIGH (ref 65–99)
Glucose-Capillary: 118 mg/dL — ABNORMAL HIGH (ref 65–99)
Glucose-Capillary: 90 mg/dL (ref 65–99)
Glucose-Capillary: 58 mg/dL — ABNORMAL LOW (ref 65–99)
Glucose-Capillary: 119 mg/dL — ABNORMAL HIGH (ref 65–99)

## 2015-08-12 LAB — PHOSPHORUS: Phosphorus: 4.3 mg/dL (ref 2.5–4.6)

## 2015-08-12 LAB — CBG MONITORING, ED: Glucose-Capillary: >600 mg/dL (ref 65–99)

## 2015-08-12 MED ORDER — FLUOXETINE HCL 20 MG PO CAPS
20.0000 mg | ORAL_CAPSULE | Freq: Every day | ORAL | Status: DC
  Administered 2015-08-12 – 2015-08-14 (×3): 20 mg via ORAL
  Filled 2015-08-12 (×3): qty 1

## 2015-08-12 MED ORDER — FOLIC ACID 1 MG PO TABS
1.0000 mg | ORAL_TABLET | Freq: Every day | ORAL | Status: DC
  Administered 2015-08-13 – 2015-08-14 (×2): 1 mg via ORAL
  Filled 2015-08-12 (×2): qty 1

## 2015-08-12 MED ORDER — INSULIN ASPART 100 UNIT/ML ~~LOC~~ SOLN
8.0000 [IU] | Freq: Once | SUBCUTANEOUS | Status: AC
  Administered 2015-08-12: 8 [IU] via INTRAVENOUS
  Filled 2015-08-12: qty 1

## 2015-08-12 MED ORDER — OXYCODONE HCL 5 MG PO TABS
20.0000 mg | ORAL_TABLET | Freq: Two times a day (BID) | ORAL | Status: DC | PRN
  Administered 2015-08-13 – 2015-08-14 (×3): 20 mg via ORAL
  Filled 2015-08-12 (×3): qty 4

## 2015-08-12 MED ORDER — DEXTROSE-NACL 5-0.45 % IV SOLN
INTRAVENOUS | Status: DC
  Administered 2015-08-12: 21:00:00 via INTRAVENOUS

## 2015-08-12 MED ORDER — POTASSIUM CHLORIDE 10 MEQ/100ML IV SOLN
10.0000 meq | INTRAVENOUS | Status: AC
  Filled 2015-08-12: qty 100

## 2015-08-12 MED ORDER — SODIUM CHLORIDE 0.9 % IV SOLN
INTRAVENOUS | Status: DC
  Administered 2015-08-12: 18:00:00 via INTRAVENOUS

## 2015-08-12 MED ORDER — SODIUM CHLORIDE 0.9 % IV BOLUS (SEPSIS)
1000.0000 mL | Freq: Once | INTRAVENOUS | Status: AC
  Administered 2015-08-12: 1000 mL via INTRAVENOUS

## 2015-08-12 MED ORDER — ATORVASTATIN CALCIUM 10 MG PO TABS
10.0000 mg | ORAL_TABLET | Freq: Every day | ORAL | Status: DC
  Administered 2015-08-12 – 2015-08-14 (×3): 10 mg via ORAL
  Filled 2015-08-12 (×3): qty 1

## 2015-08-12 MED ORDER — ALPRAZOLAM 1 MG PO TABS
1.0000 mg | ORAL_TABLET | Freq: Two times a day (BID) | ORAL | Status: DC | PRN
  Administered 2015-08-13: 1 mg via ORAL
  Filled 2015-08-12: qty 1

## 2015-08-12 MED ORDER — SODIUM CHLORIDE 0.9 % IV SOLN
INTRAVENOUS | Status: DC
  Administered 2015-08-12: 19:00:00 via INTRAVENOUS
  Filled 2015-08-12: qty 2.5

## 2015-08-12 MED ORDER — POTASSIUM CHLORIDE 10 MEQ/100ML IV SOLN
10.0000 meq | INTRAVENOUS | Status: AC
  Administered 2015-08-12 (×2): 10 meq via INTRAVENOUS
  Filled 2015-08-12 (×4): qty 100

## 2015-08-12 MED ORDER — AMLODIPINE BESYLATE 5 MG PO TABS
5.0000 mg | ORAL_TABLET | Freq: Every day | ORAL | Status: DC
  Administered 2015-08-13 – 2015-08-14 (×2): 5 mg via ORAL
  Filled 2015-08-12 (×3): qty 1

## 2015-08-12 MED ORDER — DEXTROSE 50 % IV SOLN
INTRAVENOUS | Status: AC
  Administered 2015-08-12: 25 mL
  Filled 2015-08-12: qty 50

## 2015-08-12 MED ORDER — ONDANSETRON HCL 4 MG/2ML IJ SOLN: 4.0000 mg | Freq: Four times a day (QID) | INTRAMUSCULAR | Status: DC | PRN

## 2015-08-12 MED ORDER — SODIUM CHLORIDE 0.9 % IV SOLN: INTRAVENOUS | Status: DC

## 2015-08-12 MED ORDER — ASPIRIN EC 325 MG PO TBEC
325.0000 mg | DELAYED_RELEASE_TABLET | Freq: Every day | ORAL | Status: DC
  Administered 2015-08-12 – 2015-08-14 (×3): 325 mg via ORAL
  Filled 2015-08-12 (×3): qty 1

## 2015-08-12 MED ORDER — ACETAMINOPHEN 650 MG RE SUPP: 650.0000 mg | Freq: Four times a day (QID) | RECTAL | Status: DC | PRN

## 2015-08-12 MED ORDER — ONDANSETRON HCL 4 MG PO TABS: 4.0000 mg | ORAL_TABLET | Freq: Four times a day (QID) | ORAL | Status: DC | PRN

## 2015-08-12 MED ORDER — ACETAMINOPHEN 325 MG PO TABS
650.0000 mg | ORAL_TABLET | Freq: Four times a day (QID) | ORAL | Status: DC | PRN
  Administered 2015-08-13: 650 mg via ORAL
  Filled 2015-08-12: qty 2

## 2015-08-12 NOTE — ED Provider Notes (Signed)
CSN: [id number]     Arrival date & time 08/12/15  1212 History   First MD Initiated Contact with Patient 08/12/15 1231     Chief Complaint  Patient presents with  . Hyperglycemia     (Consider location/radiation/quality/duration/timing/severity/associated sxs/prior Treatment) Patient is a 69 y.o. male presenting with hyperglycemia. The history is limited by the absence of a caregiver.  Hyperglycemia Blood sugar level PTA:  Greater than 600 Severity:  Severe Onset quality:  Gradual Duration:  2 days Timing:  Constant Progression:  Unchanged Chronicity:  New Context: noncompliance   Context comment:  Admitted and discharged , has not been taking insulijn since discharge Relieved by:  Nothing Ineffective treatments:  None tried Associated symptoms: abdominal pain, altered mental status, fatigue and polyuria   Associated symptoms: no chest pain, no dysuria, no fever, no nausea, no shortness of breath, no syncope and no vomiting     Past Medical History  Diagnosis Date  . Diabetes mellitus without complication ([place])   . Hypertension   . Hernia   . Back pain   . Seizures ([place])   . Diabetic neuropathy ([place])   . Stroke [facility])    Past Surgical History  Procedure Laterality Date  . Hip arthroplasty Right 02/05/2013    Procedure: ARTHROPLASTY BIPOLAR HIP;  Surgeon: [name], MD;  Location: [facility];  Service: Orthopedics;  Laterality: Right;   Family History  Problem Relation Age of Onset  . Diabetes Mother   . Alzheimer's disease Mother   . Hypertension Mother   . Hyperlipidemia Father   . Hypertension Father   . Healthy Maternal Grandmother   . Pneumonia Maternal Grandfather    Social History  Substance Use Topics  . Smoking status: Current Every Day Smoker -- 0.25 packs/day for 30 years    Types: Cigarettes    Last Attempt to Quit: 01/31/2013  . Smokeless tobacco: Never Used  . Alcohol Use: Yes    Review of Systems  Unable to perform ROS: Mental status  change  Constitutional: Positive for fatigue. Negative for fever.  Respiratory: Positive for cough. Negative for shortness of breath.   Cardiovascular: Negative for chest pain and syncope.  Gastrointestinal: Positive for abdominal pain. Negative for nausea and vomiting.  Endocrine: Positive for polyuria.  Genitourinary: Negative for dysuria.      Allergies  Review of patient's allergies indicates no known allergies.  Home Medications   Prior to Admission medications   Medication Sig Start Date End Date Taking? Authorizing Provider  alprazolam [name]) 2 MG tablet Take 1 mg by mouth 2 (two) times daily as needed. 07/23/15  Yes Historical Provider, MD  amLODipine (NORVASC) 5 MG tablet Take 1 tablet (5 mg total) by mouth daily. 07/04/15  Yes Donita Brooks, NP  aspirin EC 325 MG tablet Take 1 tablet (325 mg total) by mouth daily. 06/08/15  Yes Rosalin Hawking, MD  atorvastatin (LIPITOR) 10 MG tablet Take 1 tablet (10 mg total) by mouth daily at 6 PM. Patient taking differently: Take 10 mg by mouth daily.  03/23/15  Yes [name], DO  FLUoxetine (PROZAC) 20 MG capsule Take 20 mg by mouth daily.   Yes Historical Provider, MD  folic acid (FOLVITE) 1 MG tablet Take 1 mg by mouth daily.   Yes Historical Provider, MD  insulin aspart protamine- aspart (NOVOLOG MIX 70/30) (70-30) 100 UNIT/ML injection Inject 0.22 mLs (22 Units total) into the skin 2 (two) times daily with a meal. 08/10/15  Yes Tammi Klippel  Aileen Fass, MD  Oxycodone HCl 20 MG TABS Take 20 mg by mouth 2 (two) times daily as needed (pain).  05/24/15  Yes Historical Provider, MD  PRESCRIPTION MEDICATION Place 1 Dose into the right eye every 30 (thirty) days. Eye injection   Yes Historical Provider, MD  ramipril (ALTACE) 10 MG capsule Take 1 capsule (10 mg total) by mouth daily. 08/20/15  Yes Charlynne Cousins, MD  blood glucose meter kit and supplies Dispense based on patient and insurance preference. Use up to four times daily as directed.  (FOR ICD-9 250.00, 250.01). 07/04/15   Donita Brooks, NP   BP 105/40 mmHg  Pulse 82  Temp(Src) 97.1 F (36.2 C) (Oral)  Resp 16  SpO2 99% Physical Exam  Constitutional: He is oriented to person, place, and time. He appears listless. He appears ill. No distress.  HENT:  Head: Normocephalic and atraumatic.  Dry   Eyes: Conjunctivae and EOM are normal.  Neck: Normal range of motion.  Cardiovascular: Normal rate, regular rhythm, normal heart sounds and intact distal pulses.  Exam reveals no gallop and no friction rub.   No murmur heard. Pulmonary/Chest: Effort normal and breath sounds normal. No respiratory distress. He has no wheezes. He has no rales.  Abdominal: Soft. He exhibits no distension. There is no tenderness. There is no guarding.  Musculoskeletal: He exhibits no edema.  Neurological: He is oriented to person, place, and time. He has normal strength. He appears listless. GCS eye subscore is 4. GCS verbal subscore is 4. GCS motor subscore is 6.  Left sided bicep flexion weakness   Skin: Skin is warm and dry. He is not diaphoretic.  Nursing note and vitals reviewed.   ED Course  Procedures (including critical care time) Labs Review Labs Reviewed  COMPREHENSIVE METABOLIC PANEL - Abnormal; Notable for the following:    Sodium 130 (*)    Chloride 94 (*)    CO2 21 (*)    Glucose, Bld 476 (*)    BUN 34 (*)    Creatinine, Ser 2.00 (*)    GFR calc non Af Amer 32 (*)    GFR calc Af Amer 37 (*)    All other components within normal limits  CBC WITH DIFFERENTIAL/PLATELET - Abnormal; Notable for the following:    WBC 14.3 (*)    RBC 3.33 (*)    Hemoglobin 10.1 (*)    HCT 29.0 (*)    Neutro Abs 10.6 (*)    Monocytes Absolute 1.3 (*)    All other components within normal limits  BLOOD GAS, VENOUS - Abnormal; Notable for the following:    pH, Ven 7.395 (*)    pCO2, Ven 33.7 (*)    Acid-base deficit 3.6 (*)    All other components within normal limits  COMPREHENSIVE  METABOLIC PANEL - Abnormal; Notable for the following:    Glucose, Bld 151 (*)    BUN 33 (*)    Creatinine, Ser 1.60 (*)    Calcium 8.8 (*)    Total Protein 6.4 (*)    Albumin 3.4 (*)    GFR calc non Af Amer 42 (*)    GFR calc Af Amer 49 (*)    All other components within normal limits  GLUCOSE, CAPILLARY - Abnormal; Notable for the following:    Glucose-Capillary 119 (*)    All other components within normal limits  CBG MONITORING, ED - Abnormal; Notable for the following:    Glucose-Capillary >600 (*)  All other components within normal limits  MAGNESIUM  PHOSPHORUS  BASIC METABOLIC PANEL  CBC WITH DIFFERENTIAL/PLATELET  COMPREHENSIVE METABOLIC PANEL  CBC    Imaging Review No results found. I have personally reviewed and evaluated these images and lab results as part of my medical decision-making.   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 Patient ahs not been taking insulin since recent discharge, and has become increasingly sleepy per family (as they report happens when his glucose is high.) No fevers, no known source of fevers per family and pt while somnolent denies infectious symptoms. Doubt meningitis in absence of fever. No hx of head trauma, neurologic exam appears nonfocal/unchanged and doubt CVA/ICH.  Pt with glucose greater than 600 on arrival, bicar 21, AG 15, pH 7.395, not consistent with DKA, however clinical picture may be concerning for HHS.  Pt has increase in Cr to 2 from recent 1 (although it appears to fluctuate.) Pt received IVF and CMP shows glucose decreased to 476.  Urinalysis ordered and pending to evaluate for infection.  Pt to be admitted to hospitalist for further care.    Gareth Morgan, MD 08/12/15 2300

## 2015-08-12 NOTE — ED Notes (Signed)
Unable to start PIV and draw blood after 2 attempts, will have second RN to assess.

## 2015-08-12 NOTE — ED Notes (Signed)
The patient daughter empted urinal, waiting for pt to urinate again. Nurse aware

## 2015-08-12 NOTE — Progress Notes (Signed)
Dear Doctor: [name] [name]   This patient has been identified as a candidate for PICC for the following reason (s): IV therapy over 48 hours, drug extravasation potential with tissue necrosis (KCL, Dilantin, Dopamine, CaCl, MgSO4, chemo vesicant), poor veins/poor circulatory system (CHF, COPD, emphysema, diabetes, steroid use, IV drug abuse, etc.) and restarts due to phlebitis and infiltration in 24 hours If you agree, please write an order for the indicated device. For any questions contact the Vascular Access Team at [phone] if no answer, please leave a message.  Thank you for supporting the early vascular access assessment program.

## 2015-08-12 NOTE — H&P (Signed)
[facility] History and Physical  Woodard Danza [id number] DOB: [birth year] DOA: 08/12/2015  Referring physician: ER physician: Dr. Gareth Morgan PCP: Mauricio Po, [name]  Chief Complaint: confusion   HPI: 69 year old male with past medical history of uncontrolled diabetes secondary to poor adherence to medical regimen, hypertension, CKD stage 3, dyslipidemia who presented to [facility] ED from home. He was brought to ED by his daughter but she is not currently present at the bedside to provide details of present illness. Pt is lethargic and unable to give more details. Apparently his CBG's were high at home and he was very confused per family report. No respiratory distress. No  reports of fever, no diarrhea or blood in stool or urine. No lightheadedness or falls.   Pt was recently hospitalized for hypotension, rib fracture due ot fall and acute on chronic renal failure.   In ED, BP was stable. His blood work was significant for WBC count 14.3, hemoglobin 10.1, Cr 2.0 and glucose 476. CBG was more than 600. He was admitted for hyperglycemia. UA is pending at this time. He was started on insulin drip.  Assessment & Plan    Principal Problem:   Acute encephalopathy - Likely due to hyperglycemia however apparently pt has underlying memory loss - We will treat hyperglycemia with insulin drip - Obtain PT eval once pt able to participate   Active Problems:   Diabetic hyperosmolar non-ketotic state ([place]) / Uncontrolled type 2 diabetes mellitus with diabetic nephropathy, with long-term current use of insulin ([name]) - Most recent A1c 13 indicating poor glycemic control - Likely from non compliance with insulin - Started insulin drip - UA is pending - Check CBG and BMP per DKA protocol or hyperglycemic protocol    Anemia of chronic disease - Secondary to CKD - Stable hemoglobin    CKD (chronic kidney disease) stage 3, GFR 30-59 ml/min - Recent baseline 2.1 and on this admission around  2 (within baseline range)    Leukocytosis - No fevers - UA is pending  - No clear source of infection - Continue to monitor CBC    Benign essential HTN - Resumed only Norvasc since BP 105/40 - Lisinopril on hold     Dyslipidemia associated with type 2 diabetes mellitus ([name]) - Continue statin therapy    Depression - Continue Prozac    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   Code Status: Full Family Communication: Plan of care discussed with the patient  Disposition Plan: Admit for further evaluation, medical floor   Leisa Lenz, MD  [facility] Pager [phone]  Time spent in minutes: 75 minutes  Review of Systems:  Unable to obtain due to patient altered mental status   Past Medical History  Diagnosis Date  . Diabetes mellitus without complication ([place])   . Hypertension   . Hernia   . Back pain   . Seizures ([place])   . Diabetic neuropathy ([place])   . Stroke [facility])    Past Surgical History  Procedure Laterality Date  . Hip arthroplasty Right 02/05/2013    Procedure: ARTHROPLASTY BIPOLAR HIP;  Surgeon: [name], MD;  Location: [facility] ORS;  Service: Orthopedics;  Laterality: Right;   Social History:  reports that he has been smoking Cigarettes.  He has a 7.5 pack-year smoking history. He has never used smokeless tobacco. He reports that  he drinks alcohol. He reports that he does not use illicit drugs.  No Known Allergies  Family History:  Family History  Problem Relation Age of Onset  . Diabetes Mother   . Alzheimer's disease Mother   . Hypertension Mother   . Hyperlipidemia Father   . Hypertension Father   . Healthy Maternal Grandmother   . Pneumonia Maternal Grandfather      Prior to Admission medications   Medication Sig Start Date End Date Taking? Authorizing Provider  alprazolam  [name]) 2 MG tablet Take 1 mg by mouth 2 (two) times daily as needed. 07/23/15  Yes Historical Provider, MD  amLODipine (NORVASC) 5 MG tablet Take 1 tablet (5 mg total) by mouth daily. 07/04/15  Yes Donita Brooks, NP  aspirin EC 325 MG tablet Take 1 tablet (325 mg total) by mouth daily. 06/08/15  Yes Rosalin Hawking, MD  atorvastatin (LIPITOR) 10 MG tablet Take 10 mg by mouth daily.  03/23/15  Yes [name], DO  FLUoxetine (PROZAC) 20 MG capsule Take 20 mg by mouth daily.   Yes Historical Provider, MD  folic acid (FOLVITE) 1 MG tablet Take 1 mg by mouth daily.   Yes Historical Provider, MD  insulin aspart protamine- aspart (NOVOLOG MIX 70/30) (70-30) 100 UNIT/ML injection Inject 0.22 mLs (22 Units total) into the skin 2 (two) times daily with a meal. 08/10/15  Yes Charlynne Cousins, MD  Oxycodone HCl 20 MG TABS Take 20 mg by mouth 2 (two) times daily as needed (pain).  05/24/15  Yes Historical Provider, MD  ramipril (ALTACE) 10 MG capsule Take 1 capsule (10 mg total) by mouth daily. 08/20/15  Yes Charlynne Cousins, MD   Physical Exam: Filed Vitals:   08/12/15 1218 08/12/15 1222 08/12/15 1521  BP:  142/72 121/57  Pulse:  90 74  Temp:  98.6 F (37 C)   Resp:  20 14  SpO2: 96% 99%     Physical Exam  Constitutional: Appears lethargic. No distress.  HENT: Normocephalic. No tonsillar erythema or exudates Eyes: Conjunctivae are normal. No scleral icterus.  Neck: Normal ROM. Neck supple. No JVD. No tracheal deviation. No thyromegaly.  CVS: RRR, S1/S2 +, no murmurs, no gallops, no carotid bruit.  Pulmonary: Effort and breath sounds normal, no stridor, rhonchi, wheezes, rales.  Abdominal: Soft. BS +,  no distension, tenderness, rebound or guarding.  Musculoskeletal: No edema and no tenderness. Unable to assess ROM due to patient;s lethargy  Lymphadenopathy: No lymphadenopathy noted, cervical, inguinal. Neuro: Alert. Normal reflexes, muscle tone coordination. No focal neurologic deficits. Skin:  Skin is warm and dry. No rash noted.  No erythema. No pallor.  Psychiatric: Unable to assess due to patient;s lethargy   Labs on Admission:  Basic Metabolic Panel:  Recent Labs Lab 08/07/15 1521 08/07/15 1850 08/08/15 0053 08/09/15 0810 08/10/15 0550 08/12/15 1353  NA 136 139 136 132* 140 130*  K 3.8 3.7 4.4 4.9 4.2 4.2  CL 103 103 110 107 108 94*  CO2 22  --  20* 18* 25 21*  GLUCOSE 139* 56* 99 176* 44* 476*  BUN 34* 28* 31* 18 17 34*  CREATININE 3.05* 2.90* 3.16* 1.32* 1.01 2.00*  CALCIUM 9.3  --  7.5* 7.8* 8.5* 9.2   Liver Function Tests:  Recent Labs Lab 08/07/15 1521 08/08/15 0053 08/12/15 1353  AST 38 28 35  ALT 40 26 33  ALKPHOS 108 78 93  BILITOT 0.5 0.4 0.4  PROT 7.9 5.4* 7.0  ALBUMIN  4.3 2.8* 3.8   No results for input(s): LIPASE, AMYLASE in the last 168 hours. No results for input(s): AMMONIA in the last 168 hours. CBC:  Recent Labs Lab 08/07/15 1521 08/07/15 1850 08/08/15 0053 08/08/15 0645 08/12/15 1353  WBC 10.0  --  7.5 7.1 14.3*  NEUTROABS 5.7  --  3.6  --  10.6*  HGB 11.7* 12.9* 9.0* 9.3* 10.1*  HCT 35.8* 38.0* 27.2* 28.9* 29.0*  MCV 92.0  --  92.2 93.5 87.1  PLT 208  --  178 181 276   Cardiac Enzymes:  Recent Labs Lab 08/08/15 0053 08/08/15 0645  TROPONINI <0.03 <0.03   BNP: Invalid input(s): POCBNP CBG:  Recent Labs Lab 08/09/15 1644 08/09/15 2213 08/10/15 0748 08/10/15 1116 08/12/15 1227  GLUCAP 349* 104* 95 269* >600*    If 7PM-7AM, please contact night-coverage [email or web address] Password [email or web address] 08/12/2015, 4:38 PM

## 2015-08-12 NOTE — ED Notes (Signed)
Per EMS pt coming from home with c/o hyperglycemia, EMS sts they were called at 0600 today and at time pt refused treatment? Per EMS pt is confused at baseline, with hx of dementia, however family sts pt is non-compliant with medications. Pt appears lethargic on arrival.

## 2015-08-12 NOTE — ED Notes (Signed)
Bed: WA04 Expected date:  Expected time:  Means of arrival:  Comments: EMS- Hyperglycemia

## 2015-08-13 DIAGNOSIS — E1101 Type 2 diabetes mellitus with hyperosmolarity with coma: Secondary | ICD-10-CM

## 2015-08-13 LAB — GLUCOSE, CAPILLARY
Glucose-Capillary: 65 mg/dL (ref 65–99)
Glucose-Capillary: 95 mg/dL (ref 65–99)
Glucose-Capillary: 250 mg/dL — ABNORMAL HIGH (ref 65–99)
Glucose-Capillary: 60 mg/dL — ABNORMAL LOW (ref 65–99)
Glucose-Capillary: 86 mg/dL (ref 65–99)
Glucose-Capillary: 65 mg/dL (ref 65–99)

## 2015-08-13 LAB — CBC
HCT: 25.1 % — ABNORMAL LOW (ref 39.0–52.0)
Hemoglobin: 8.7 g/dL — ABNORMAL LOW (ref 13.0–17.0)
MCH: 30.3 pg (ref 26.0–34.0)
MCHC: 34.7 g/dL (ref 30.0–36.0)
MCV: 87.5 fL (ref 78.0–100.0)
Platelets: 217 10*3/uL (ref 150–400)
RBC: 2.87 MIL/uL — ABNORMAL LOW (ref 4.22–5.81)
RDW: 13.4 % (ref 11.5–15.5)
WBC: 10 10*3/uL (ref 4.0–10.5)

## 2015-08-13 LAB — BASIC METABOLIC PANEL
Anion gap: 8 (ref 5–15)
BUN: 31 mg/dL — ABNORMAL HIGH (ref 6–20)
CO2: 25 mmol/L (ref 22–32)
Calcium: 8.7 mg/dL — ABNORMAL LOW (ref 8.9–10.3)
Chloride: 104 mmol/L (ref 101–111)
Creatinine, Ser: 1.36 mg/dL — ABNORMAL HIGH (ref 0.61–1.24)
GFR calc Af Amer: 59 mL/min — ABNORMAL LOW (ref >60)
GFR calc non Af Amer: 51 mL/min — ABNORMAL LOW (ref >60)
Glucose, Bld: 73 mg/dL (ref 65–99)
Potassium: 3.9 mmol/L (ref 3.5–5.1)
Sodium: 137 mmol/L (ref 135–145)

## 2015-08-13 LAB — CULTURE, BLOOD (ROUTINE X 2)
Culture: NO GROWTH
Culture: NO GROWTH

## 2015-08-13 LAB — COMPREHENSIVE METABOLIC PANEL
ALT: 20 U/L (ref 17–63)
AST: 26 U/L (ref 15–41)
Albumin: 2.8 g/dL — ABNORMAL LOW (ref 3.5–5.0)
Alkaline Phosphatase: 68 U/L (ref 38–126)
Anion gap: 7 (ref 5–15)
BUN: 29 mg/dL — ABNORMAL HIGH (ref 6–20)
CO2: 24 mmol/L (ref 22–32)
Calcium: 8.2 mg/dL — ABNORMAL LOW (ref 8.9–10.3)
Chloride: 105 mmol/L (ref 101–111)
Creatinine, Ser: 1.6 mg/dL — ABNORMAL HIGH (ref 0.61–1.24)
GFR calc Af Amer: 49 mL/min — ABNORMAL LOW (ref >60)
GFR calc non Af Amer: 42 mL/min — ABNORMAL LOW (ref >60)
Glucose, Bld: 84 mg/dL (ref 65–99)
Potassium: 3.8 mmol/L (ref 3.5–5.1)
Sodium: 136 mmol/L (ref 135–145)
Total Bilirubin: 0.6 mg/dL (ref 0.3–1.2)
Total Protein: 5.6 g/dL — ABNORMAL LOW (ref 6.5–8.1)

## 2015-08-13 MED ORDER — INSULIN ASPART 100 UNIT/ML ~~LOC~~ SOLN: 0.0000 [IU] | Freq: Every day | SUBCUTANEOUS | Status: DC

## 2015-08-13 MED ORDER — INSULIN ASPART PROT & ASPART (70-30 MIX) 100 UNIT/ML ~~LOC~~ SUSP
22.0000 [IU] | Freq: Two times a day (BID) | SUBCUTANEOUS | Status: DC
  Administered 2015-08-13 – 2015-08-14 (×2): 22 [IU] via SUBCUTANEOUS
  Filled 2015-08-13: qty 10

## 2015-08-13 MED ORDER — GLUCERNA SHAKE PO LIQD
237.0000 mL | Freq: Two times a day (BID) | ORAL | Status: DC
  Administered 2015-08-13 – 2015-08-14 (×2): 237 mL via ORAL
  Filled 2015-08-13 (×3): qty 237

## 2015-08-13 MED ORDER — INSULIN ASPART 100 UNIT/ML ~~LOC~~ SOLN
0.0000 [IU] | Freq: Three times a day (TID) | SUBCUTANEOUS | Status: DC
  Administered 2015-08-13: 5 [IU] via SUBCUTANEOUS
  Administered 2015-08-14: 3 [IU] via SUBCUTANEOUS

## 2015-08-13 NOTE — Progress Notes (Addendum)
Patient ID: [name] [name], male   DOB: 02/05/1946, 69 y.o.   MRN: [id number] [facility] PROGRESS NOTE  Jerik Yildiz [id number] DOB: [birth year] DOA: 08/12/2015 PCP: Mauricio Po, [name]  Brief narrative:    69 year old male with past medical history of uncontrolled diabetes secondary to poor adherence to medical regimen, hypertension, CKD stage 3, dyslipidemia who presented to [facility] ED from home with confusion and high CBG's. Apparently per his family he is not compliant with insulin and his sugars tend to run high. Pt was recently hospitalized for hypotension, rib fracture due ot fall and acute on chronic renal failure.   In ED, BP was stable. Blood work was significant for WBC count 14.3, hemoglobin 10.1, Cr 2.0 and glucose 476. CBG was more than 600. He was admitted for hyperglycemia. He was started on insulin drip.  Anticipated discharge: Needs PT evaluation. Anticipate discharge 08/14/2015.   Assessment/Plan:    Principal Problem:  Acute encephalopathy - Possibly from hyperglycemia - Better mental status this am - Awaiting PT evaluation    Active Problems:  Diabetic hyperosmolar non-ketotic state ([place]) / Uncontrolled type 2 diabetes mellitus with diabetic nephropathy, with long-term current use of insulin ([name]) - Most recent A1c 13 indicating poor glycemic control secondary to poor adherence to insulin regimen  - Pt on insulin drip on admission. Stop insulin today.  - Start insulin regimen per home dosing. Add sliding scale insulin    Anemia of chronic disease - Secondary to chronic kidney disease  - Hemoglobin is 8.7, stable - No current indications for transfusion    CKD (chronic kidney disease) stage 3, GFR 30-59 ml/min - Recent baseline 2.1  - Cr on this admission around 2 (within baseline range)   Leukocytosis - No fevers. WBC count now WNL - No obvious source of infection    Benign essential HTN - Continue Norvasc for now. 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  Family Communication:  plan of care discussed with the patient Disposition Plan: home likely by 11/1.  IV access:  Peripheral IV  Procedures and diagnostic studies:    No results found.  Medical Consultants:  None   Other Consultants:  PT  IAnti-Infectives:   None    Leisa Lenz, MD  [facility] Pager [phone]  Time spent in minutes: 25 minutes  If 7PM-7AM, please contact night-coverage [email or web address] Password [email or web address] 08/13/2015, 11:11 AM   LOS: 1 day    HPI/Subjective: No acute overnight events. Patient reports he feels better.   Objective: Filed Vitals:   08/12/15 1521 08/12/15 1706 08/12/15 2144 08/13/15 0607  BP: 121/57 117/53 105/40 102/59  Pulse: 74 79 82 66  Temp:  98.6 F (37 C) 97.1 F (36.2 C) 98.2 F (36.8 C)  TempSrc:  Oral Oral Oral  Resp: 14 16 16 16   Weight:    65.137 kg (143 lb 9.6 oz)  SpO2:  95% 99% 100%    Intake/Output Summary (Last 24 hours) at 08/13/15 1111 Last data filed at 08/13/15 0945  Gross per 24 hour  Intake  360 ml  Output    350 ml  Net     10 ml    Exam:   General:  Pt is alert, not in acute distress  Cardiovascular: Regular rate and rhythm, S1/S2 appreciated   Respiratory: Clear to auscultation bilaterally, no wheezing, no crackles, no rhonchi  Abdomen: Soft, non tender, non distended, bowel sounds present  Extremities: No edema, pulses DP and PT palpable bilaterally  Neuro: Grossly nonfocal  Data Reviewed: Basic Metabolic Panel:  Recent Labs Lab 08/10/15 0550 08/12/15 1353 08/12/15 1637  08/13/15 0055 08/13/15 0531  NA 140 130* 135 137 136  K 4.2 4.2 3.6 3.9 3.8  CL 108 94* 102 104 105  CO2 25 21* 22 25 24   GLUCOSE 44* 476* 151* 73 84  BUN 17 34* 33* 31* 29*  CREATININE 1.01 2.00* 1.60* 1.36* 1.60*  CALCIUM 8.5* 9.2 8.8* 8.7* 8.2*  MG  --   --  2.3  --   --   PHOS  --   --  4.3  --   --    Liver Function Tests:  Recent Labs Lab 08/07/15 1521 08/08/15 0053 08/12/15 1353 08/12/15 1637 08/13/15 0531  AST 38 28 35 29 26  ALT 40 26 33 29 20  ALKPHOS 108 78 93 83 68  BILITOT 0.5 0.4 0.4 0.4 0.6  PROT 7.9 5.4* 7.0 6.4* 5.6*  ALBUMIN 4.3 2.8* 3.8 3.4* 2.8*   No results for input(s): LIPASE, AMYLASE in the last 168 hours. No results for input(s): AMMONIA in the last 168 hours. CBC:  Recent Labs Lab 08/07/15 1521 08/07/15 1850 08/08/15 0053 08/08/15 0645 08/12/15 1353 08/13/15 0531  WBC 10.0  --  7.5 7.1 14.3* 10.0  NEUTROABS 5.7  --  3.6  --  10.6*  --   HGB 11.7* 12.9* 9.0* 9.3* 10.1* 8.7*  HCT 35.8* 38.0* 27.2* 28.9* 29.0* 25.1*  MCV 92.0  --  92.2 93.5 87.1 87.5  PLT 208  --  178 181 276 217   Cardiac Enzymes:  Recent Labs Lab 08/08/15 0053 08/08/15 0645  TROPONINI <0.03 <0.03   BNP: Invalid input(s): POCBNP CBG:  Recent Labs Lab 08/12/15 2037 08/12/15 2142 08/12/15 2252 08/13/15 0726 08/13/15 0812  GLUCAP 90 58* 119* 65 95    Recent Results (from the past 240 hour(s))  Culture, blood (x 2)     Status: None   Collection Time: 08/08/15 12:53 AM  Result Value Ref Range Status   Specimen Description BLOOD RIGHT ANTECUBITAL  Final   Special Requests BOTTLES DRAWN AEROBIC AND ANAEROBIC 5CC  Final   Culture   Final    NO GROWTH 5 DAYS Performed at [facility]    Report Status 08/13/2015 FINAL  Final  Culture, blood (x 2)     Status: None   Collection Time: 08/08/15 12:54 AM  Result Value Ref Range Status   Specimen Description BLOOD LEFT ARM  Final   Special Requests IN PEDIATRIC BOTTLE 3CC  Final   Culture   Final     NO GROWTH 5 DAYS Performed at [facility]    Report Status 08/13/2015 FINAL  Final  MRSA PCR Screening     Status: None   Collection Time: 08/08/15  4:16 AM  Result Value Ref Range Status   MRSA by PCR NEGATIVE NEGATIVE Final     Scheduled Meds: . amLODipine  5 mg Oral Daily  . aspirin EC  325 mg Oral Daily  . atorvastatin  10 mg Oral Daily  . feeding supplement (GLUCERNA SHAKE)  237 mL Oral BID BM  . FLUoxetine  20 mg Oral Daily  . folic acid  1 mg Oral Daily  . insulin aspart  0-15 Units Subcutaneous TID WC  . insulin aspart  0-5 Units Subcutaneous QHS  . insulin aspart protamine- aspart  22 Units Subcutaneous BID WC

## 2015-08-13 NOTE — Progress Notes (Signed)
Hypoglycemic Event  CBG: 65  Treatment: 4 oz juice  Symptoms: None  Follow-up CBG: Time: 08:12 CBG Result:95  Possible Reasons for Event: Lack of nutritional intake  Comments/MD notified:    Claris Pong

## 2015-08-13 NOTE — Progress Notes (Addendum)
Hypoglycemic Event  CBG: 60  Treatment: 4 ounce juice  Symptoms: None  Follow-up CBG: Time: 1742 CBG Result: 86  Possible Reasons for Event: lack of intake  Comments/MD notified: Dr. Charlies Silvers stated to hold 1700 22 units of 70/30 novolog    [name] [name] N

## 2015-08-13 NOTE — Care Management Note (Signed)
Case Management Note  Patient Details  Name: Mikey Congrove MRN: [id number] Date of Birth: [birth year]  Subjective/Objective:           hyperglycemia         Action/Plan:Date: August 13, 2015 Chart reviewed for concurrent status and case management needs. Will continue to follow patient for changes and needs: [name], RN, BSN, [place]   [phone]   Expected Discharge Date:   (UNKNOWN)               Expected Discharge Plan:  Home/Self Care  In-House Referral:  NA  Discharge planning Services  CM Consult  Post Acute Care Choice:  NA Choice offered to:  NA  DME Arranged:    DME Agency:     HH Arranged:    HH Agency:     Status of Service:  In process, will continue to follow  Medicare Important Message Given:    Date Medicare IM Given:    Medicare IM give by:    Date Additional Medicare IM Given:    Additional Medicare Important Message give by:     If discussed at [place] of Stay Meetings, dates discussed:    Additional Comments:  Leeroy Cha, RN 08/13/2015, 12:40 PM

## 2015-08-13 NOTE — Evaluation (Addendum)
Physical Therapy Evaluation Patient Details Name: [name] [name] MRN: [id number] DOB: 03-17-1946 Today's Date: 08/13/2015   History of Present Illness  69 y.o. male with prior history of DM, HTN, HLD, DKA, Seizures, Stroke presents to the hospital 08/11/14 with AMS, hyperglycemia. recently in hospital after a fall leading to left 6th rib fracture and  dehydration, hypotension, acute on chronic renal failure  Clinical Impression  Patient appears much weaker than  Last weeks evaluation. Pt admitted with above diagnosis. Pt currently with functional limitations due to the deficits listed below (see PT Problem List). Pt will benefit from skilled PT to increase their independence and safety with mobility to allow discharge to the venue listed below.       Follow Up Recommendations Home health PT;Supervision/Assistance - 24 hour    Equipment Recommendations   (tbd)    Recommendations for Other Services       Precautions / Restrictions Precautions Precautions: Fall Precaution Comments: L rib fx      Mobility  Bed Mobility         Supine to sit: Supervision Sit to supine: Supervision      Transfers Overall transfer level: Needs assistance Equipment used: 1 person hand held assist Transfers: Sit to/from Stand Sit to Stand: Min assist         General transfer comment: steady assist to stand for  the bed x 2, wide base, staggering.   Ambulation/Gait Ambulation/Gait assistance: Mod assist   Assistive device: 1 person hand held assist       General Gait Details: side steps  x 5 along bed, decreased balance with weight shift.  Stairs            Wheelchair Mobility    Modified Rankin (Stroke Patients Only)       Balance Overall balance assessment: History of Falls;Needs assistance Sitting-balance support: Feet supported Sitting balance-Leahy Scale: Fair     Standing balance support: During functional activity;Single extremity supported Standing  balance-Leahy Scale: Poor                               Pertinent Vitals/Pain Pain Assessment: Faces Faces Pain Scale: Hurts little more Pain Location: l ribs    Home Living Family/patient expects to be discharged to:: Private residence   Available Help at Discharge: Family;Available 24 hours/day Type of Home: House Home Access: Level entry     Home Layout: One level Home Equipment: None      Prior Function Level of Independence: Independent         Comments: Does not drive     Hand Dominance   Dominant Hand: Right    Extremity/Trunk Assessment   Upper Extremity Assessment: Generalized weakness           Lower Extremity Assessment: Generalized weakness      Cervical / Trunk Assessment: Normal  Communication   Communication: No difficulties  Cognition Arousal/Alertness: Awake/alert   Overall Cognitive Status: Impaired/Different from baseline Area of Impairment: Orientation Orientation Level: Situation;Time                  General Comments      Exercises        Assessment/Plan    PT Assessment Patient needs continued PT services  PT Diagnosis Difficulty walking;Generalized weakness   PT Problem List Decreased strength;Decreased activity tolerance;Decreased balance;Decreased mobility;Decreased safety awareness;Decreased knowledge of precautions;Decreased knowledge of use of DME  PT Treatment Interventions DME instruction;Gait  training;Functional mobility training;Therapeutic activities;Patient/family education   PT Goals (Current goals can be found in the Care Plan section) Acute Rehab PT Goals Patient Stated Goal: to go home PT Goal Formulation: With patient Time For Goal Achievement: 08/27/15 Potential to Achieve Goals: Good    Frequency Min 3X/week   Barriers to discharge Decreased caregiver support      Co-evaluation               End of Session   Activity Tolerance: Patient limited by fatigue Patient  left: in bed;with call bell/phone within reach;with bed alarm set Nurse Communication: Mobility status         Time: [id number]-      Charges:   PT Evaluation $Initial PT Evaluation Tier I: 1 Procedure     PT G CodesClaretha Cooper 08/13/2015, 3:29 PM Tresa Endo PT [phone]

## 2015-08-13 NOTE — Progress Notes (Signed)
Initial Nutrition Assessment  DOCUMENTATION CODES:   Not applicable  INTERVENTION:  - Will order Glucerna Shake BID, each supplement provides 220 kcal and 10 grams of protein - RD will continue to monitor for needs  NUTRITION DIAGNOSIS:   Limited adherence to nutrition-related recommendations related to other (see comment) (CBGs: 65- >600 mg/dL) as evidenced by per patient/family report.  GOAL:   Patient will meet greater than or equal to 90% of their needs  MONITOR:   PO intake, Supplement acceptance, Weight trends, Labs, Skin, I & O's  REASON FOR ASSESSMENT:   Malnutrition Screening Tool  ASSESSMENT:   69 year old male with past medical history of uncontrolled diabetes secondary to poor adherence to medical regimen, hypertension, CKD stage 3, dyslipidemia who presented to [facility] ED from home. He was brought to ED by his daughter but she is not currently present at the bedside to provide details of present illness. Pt is lethargic and unable to give more details. Apparently his CBG's were high at home and he was very confused per family report. No respiratory distress.   Pt seen for MST. BMI indicates normal weight. No intakes documented at this time. Pt sleeping at time of visit and pt's daughter is present at bedside; she requests physical assessment not be done at this time. Will complete physical assessment at follow-up.   Daughter reports that PTA pt was living with his sister and that sister reported pt was "eating junk, not taking care of himself because he was depressed." She states that pt was not taking care of DM needs during this time. Daughter is unsure of weight changes PTA and is also unsure of UBW.   Per chart review, pt weighted 149 lbs on 07/04/15 and gained weight to 173 lbs on 07/08/15 and then lost weight to 158 lbs 08/09/15. Weight on admission was 143 lbs. This would indicate 6 lb weight loss (4% body weight) in 3 weeks which is significant for time frame. Will  continue to monitor weight trends.  Unable to confirm malnutrition at this time although it may be present. Will order Glucerna Shake BID to supplement. Unsure if pt was meeting needs PTA. Medications reviewed. Labs reviewed; CBGs: 65->600 mg/dL, BUN/creatinine elevated, Ca: 8.2 mg/dL, GFR: 49.   Diet Order:  Diet Carb Modified Fluid consistency:: Thin; Room service appropriate?: Yes  Skin:  Reviewed, no issues  Last BM:  10/27  Height:   Ht Readings from Last 1 Encounters:  08/08/15 5\' 9"  (1.753 m)    Weight:   Wt Readings from Last 1 Encounters:  08/13/15 143 lb 9.6 oz (65.137 kg)    Ideal Body Weight:  72.73 kg (kg)  BMI:  Body mass index is 21.2 kg/(m^2).  Estimated Nutritional Needs:   Kcal:  1400-1600  Protein:  65-75 grams  Fluid:  2-2.2 L/day  EDUCATION NEEDS:   No education needs identified at this time     [name] [name], RD, LDN Inpatient Clinical Dietitian Pager # [phone] After hours/weekend pager # [phone]

## 2015-08-13 NOTE — Plan of Care (Signed)
Problem: Phase I Progression Outcomes Goal: NPO or per MD order Outcome: Not Applicable Date Met:  50/38/88 DKA resolved

## 2015-08-14 ENCOUNTER — Telehealth: Payer: Self-pay | Admitting: *Deleted

## 2015-08-14 LAB — GLUCOSE, CAPILLARY
Glucose-Capillary: 178 mg/dL — ABNORMAL HIGH (ref 65–99)
Glucose-Capillary: 179 mg/dL — ABNORMAL HIGH (ref 65–99)

## 2015-08-14 MED ORDER — GLUCERNA SHAKE PO LIQD: 237.0000 mL | Freq: Two times a day (BID) | ORAL | Status: DC

## 2015-08-14 MED ORDER — INSULIN ASPART PROT & ASPART (70-30 MIX) 100 UNIT/ML ~~LOC~~ SUSP: 10.0000 [IU] | Freq: Two times a day (BID) | SUBCUTANEOUS | Status: DC

## 2015-08-14 MED ORDER — AMLODIPINE BESYLATE 10 MG PO TABS: 10.0000 mg | ORAL_TABLET | Freq: Every day | ORAL | Status: DC

## 2015-08-14 MED ORDER — ACETAMINOPHEN 325 MG PO TABS: 650.0000 mg | ORAL_TABLET | Freq: Four times a day (QID) | ORAL | Status: DC | PRN

## 2015-08-14 NOTE — Discharge Summary (Signed)
Physician Discharge Summary  [name] [name] VOH:[id number] DOB: [birth year] DOA: 08/12/2015  PCP: Mauricio Po, [name]  Admit date: 08/12/2015 Discharge date: 08/14/2015  Recommendations for Outpatient Follow-up:  Please note we decreased your insulin to 10 units twice a day unless you eat full meal than your insulin can be as prior to this admission, 22 Units twice a day  Please hold ramipril due to renal insufficiency. Increase Norvasc to 10 mg a day for better blood pressure control.  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 Apparently per his family he is not compliant with insulin and his sugars tend to run high. Pt was recently hospitalized for hypotension, rib fracture due ot fall and acute on chronic renal failure.   In ED, BP was stable. Blood work was significant for WBC count 14.3, hemoglobin 10.1, Cr 2.0 and glucose 476. CBG was more than 600. He was admitted for hyperglycemia. He was started on insulin drip.  Hospital Course:  Principal Problem:  Acute encephalopathy - Likely due to hyperglycemia (verus possible hypoglycemic events) - Mental status much better this am  - Per PT eval  - recommendation for [facility] PT, order placed   Active Problems:  Diabetic hyperosmolar non-ketotic state ([place]) / Uncontrolled type 2 diabetes mellitus with diabetic nephropathy, with long-term current use of insulin ([name]) - Most recent A1c 13 indicating poor glycemic control secondary to poor adherence to insulin regimen  - CBG's in past 24 hours: 65, 178, 179 - He is on insulin 22 U BID at home but he may have intermittent hypoglycemia due to poor eating habits so we will recomen 10 units BIDuntil his PO intake better   Anemia of chronic disease - Secondary to chronic kidney disease  - Hemoglobin is 8.7, slight drop since admission - No bleeding - Likely dilutional from IV fluids given on admission    CKD (chronic kidney disease) stage 3, GFR 30-59 ml/min - Recent baseline 2.1  - Cr on this admission around 2 (within baseline range) and even better on 10/31 (Cr 1.6)   Leukocytosis - No fevers. 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  Family Communication: plan of care discussed  with the patient   IV access:  Peripheral IV  Procedures and diagnostic studies:   No results found.  Medical Consultants:  None   Other Consultants:  PT IAnti-Infectives:   None     Signed:  Leisa Lenz, MD  [facility] 08/14/2015, 9:06 AM  Pager #: [phone]  Time spent in minutes: more than 30 minutes   Discharge Exam: Filed  Vitals:   08/14/15 0538  BP: 160/90  Pulse: 76  Temp: 98.7 F (37.1 C)  Resp: 18   Filed Vitals:   08/13/15 1451 08/13/15 1600 08/13/15 1900 08/14/15 0538  BP: 133/64  119/71 160/90  Pulse: 75  62 76  Temp: 98.6 F (37 C)  98.5 F (36.9 C) 98.7 F (37.1 C)  TempSrc: Oral  Oral Oral  Resp: '16  18 18  ' Height:  '5\' 9"'  (1.753 m)    Weight:      SpO2: 99%  100% 97%    General: Pt is alert, follows commands appropriately, not in acute distress Cardiovascular: Regular rate and rhythm, S1/S2 +, no murmurs Respiratory: Clear to auscultation bilaterally, no wheezing, no crackles, no rhonchi Abdominal: Soft, non tender, non distended, bowel sounds +, no guarding Extremities: no edema, no cyanosis, pulses palpable bilaterally DP and PT Neuro: Grossly nonfocal  Discharge Instructions  Discharge Instructions    Call MD for:  difficulty breathing, headache or visual disturbances    Complete by:  As directed      Call MD for:  persistant dizziness or light-headedness    Complete by:  As directed      Call MD for:  persistant nausea and vomiting    Complete by:  As directed      Call MD for:  severe uncontrolled pain    Complete by:  As directed      Diet - low sodium heart healthy    Complete by:  As directed      Discharge instructions    Complete by:  As directed   Please note we decreased your insulin to 10 units twice a day unless you eat full meal than your insulin can be as prior to this admission, 22 Units twice a day  Please hold ramipril due to renal insufficiency. Increase Norvasc to 10 mg a day for better blood pressure control.     Increase activity slowly    Complete by:  As directed             Medication List    STOP taking these medications        ramipril 10 MG capsule  Commonly known as:  ALTACE      TAKE these medications        acetaminophen 325 MG tablet  Commonly known as:  TYLENOL  Take 2 tablets (650 mg total) by mouth every 6 (six) hours as  needed for mild pain (or Fever >/= 101).     alprazolam 2 MG tablet  Commonly known as:  XANAX  Take 1 mg by mouth 2 (two) times daily as needed.     amLODipine 10 MG tablet  Commonly known as:  NORVASC  Take 1 tablet (10 mg total) by mouth daily.     aspirin EC 325 MG tablet  Take 1 tablet (325 mg total) by mouth daily.     atorvastatin 10 MG tablet  Commonly known as:  LIPITOR  Take 1 tablet (10 mg total) by mouth daily at 6 PM.  blood glucose meter kit and supplies  Dispense based on patient and insurance preference. Use up to four times daily as directed. (FOR ICD-9 250.00, 250.01).     feeding supplement (GLUCERNA SHAKE) Liqd  Take 237 mLs by mouth 2 (two) times daily between meals.     FLUoxetine 20 MG capsule  Commonly known as:  PROZAC  Take 20 mg by mouth daily.     folic acid 1 MG tablet  Commonly known as:  FOLVITE  Take 1 mg by mouth daily.     insulin aspart protamine- aspart (70-30) 100 UNIT/ML injection  Commonly known as:  NOVOLOG MIX 70/30  Inject 0.1 mLs (10 Units total) into the skin 2 (two) times daily with a meal.     Oxycodone HCl 20 MG Tabs  Take 20 mg by mouth 2 (two) times daily as needed (pain).     PRESCRIPTION MEDICATION  Place 1 Dose into the right eye every 30 (thirty) days. Eye injection           Follow-up Information    Follow up with Mauricio Po, [place]. Schedule an appointment as soon as possible for a visit in 1 week.   Specialty:  Family Medicine   Why:  Follow up appt after recent hospitalization   Contact information:   [place] [address] [phone]        The results of significant diagnostics from this hospitalization (including imaging, microbiology, ancillary and laboratory) are listed below for reference.    Significant Diagnostic Studies: Dg Ribs Unilateral W/chest Left  08/07/2015  CLINICAL DATA:  Fall, mid axillary rib pain as marked. EXAM: LEFT RIBS AND CHEST - 3+ VIEW COMPARISON:   Chest x-ray dated 07/05/2015. FINDINGS: Single view of the chest and four views of the left ribs are provided. There is a slightly displaced fracture of the left lateral fifth rib. No other rib fracture seen. Cardiomediastinal silhouette remains normal in size and configuration. Lungs are clear. No pleural effusion seen. No pneumothorax seen. Lung volumes are normal. IMPRESSION: Slightly displaced fracture of the left lateral fifth rib. Electronically Signed   By: Franki Cabot M.D.   On: 08/07/2015 15:14   Ct Head Wo Contrast  08/07/2015  CLINICAL DATA:  Patient fell tonight at home while going to the bathroom. Complaining of left-sided chest wall pain. EXAM: CT HEAD WITHOUT CONTRAST CT CERVICAL SPINE WITHOUT CONTRAST TECHNIQUE: Multidetector CT imaging of the head and cervical spine was performed following the standard protocol without intravenous contrast. Multiplanar CT image reconstructions of the cervical spine were also generated. COMPARISON:  07/02/2015 FINDINGS: CT HEAD FINDINGS Ventricles are normal in size and configuration. There are no parenchymal masses or mass effect. There is no evidence of a cortical infarct. Mild periventricular white matter hypoattenuation is noted consistent with chronic microvascular ischemic change. There are no extra-axial masses or abnormal fluid collections. There is no intracranial hemorrhage. Small amount of dependent fluid is seen in the visualize left maxillary sinus. Remaining sinuses are clear as are the mastoid air cells. No skull fracture. CT CERVICAL SPINE FINDINGS No fracture. No spondylolisthesis. There is a reversal of the normal cervical lordosis. This is centered at C4. There is marked loss of disc height at C3-4 through C6-C7. Endplate spurring is noted at these levels. Facet joints are relatively well preserved. Bones are diffusely demineralized. Soft tissues are unremarkable.  Lung apices are clear. IMPRESSION: HEAD CT: No acute intracranial  abnormalities. Mild chronic microvascular ischemic change. Small  air-fluid level in the visualize left maxillary sinus, nonspecific. This was present on the prior CT. It is likely inflammatory in origin. CERVICAL CT:  No fracture or acute finding. Electronically Signed   By: Lajean Manes M.D.   On: 08/07/2015 20:49   Ct Cervical Spine Wo Contrast  08/07/2015  CLINICAL DATA:  Patient fell tonight at home while going to the bathroom. Complaining of left-sided chest wall pain. EXAM: CT HEAD WITHOUT CONTRAST CT CERVICAL SPINE WITHOUT CONTRAST TECHNIQUE: Multidetector CT imaging of the head and cervical spine was performed following the standard protocol without intravenous contrast. Multiplanar CT image reconstructions of the cervical spine were also generated. COMPARISON:  07/02/2015 FINDINGS: CT HEAD FINDINGS Ventricles are normal in size and configuration. There are no parenchymal masses or mass effect. There is no evidence of a cortical infarct. Mild periventricular white matter hypoattenuation is noted consistent with chronic microvascular ischemic change. There are no extra-axial masses or abnormal fluid collections. There is no intracranial hemorrhage. Small amount of dependent fluid is seen in the visualize left maxillary sinus. Remaining sinuses are clear as are the mastoid air cells. No skull fracture. CT CERVICAL SPINE FINDINGS No fracture. No spondylolisthesis. There is a reversal of the normal cervical lordosis. This is centered at C4. There is marked loss of disc height at C3-4 through C6-C7. Endplate spurring is noted at these levels. Facet joints are relatively well preserved. Bones are diffusely demineralized. Soft tissues are unremarkable.  Lung apices are clear. IMPRESSION: HEAD CT: No acute intracranial abnormalities. Mild chronic microvascular ischemic change. Small air-fluid level in the visualize left maxillary sinus, nonspecific. This was present on the prior CT. It is likely inflammatory in  origin. CERVICAL CT:  No fracture or acute finding. Electronically Signed   By: Lajean Manes M.D.   On: 08/07/2015 20:49   Dg Chest Port 1 View  08/08/2015  CLINICAL DATA:  Sepsis. History of diabetes, hypertension and stroke. EXAM: PORTABLE CHEST 1 VIEW COMPARISON:  Chest radiograph August 07, 2015 FINDINGS: Patchy RIGHT middle lobe airspace opacity. Strandy densities LEFT lung base. No pleural effusion. Cardiac silhouette is normal. Mildly tortuous, possibly ectatic aorta. Focal pleural thickening associated with LEFT rib fracture which was documented on yesterday's radiographs. IMPRESSION: RIGHT middle lobe atelectasis/ pneumonia. LEFT lung base atelectasis. Electronically Signed   By: Elon Alas M.D.   On: 08/08/2015 00:53    Microbiology: Recent Results (from the past 240 hour(s))  Culture, blood (x 2)     Status: None   Collection Time: 08/08/15 12:53 AM  Result Value Ref Range Status   Specimen Description BLOOD RIGHT ANTECUBITAL  Final   Special Requests BOTTLES DRAWN AEROBIC AND ANAEROBIC 5CC  Final   Culture   Final    NO GROWTH 5 DAYS Performed at [facility]    Report Status 08/13/2015 FINAL  Final  Culture, blood (x 2)     Status: None   Collection Time: 08/08/15 12:54 AM  Result Value Ref Range Status   Specimen Description BLOOD LEFT ARM  Final   Special Requests IN PEDIATRIC BOTTLE 3CC  Final   Culture   Final    NO GROWTH 5 DAYS Performed at [facility]    Report Status 08/13/2015 FINAL  Final  MRSA PCR Screening     Status: None   Collection Time: 08/08/15  4:16 AM  Result Value Ref Range Status   MRSA by PCR NEGATIVE NEGATIVE Final    Comment:  The GeneXpert MRSA Assay (FDA approved for NASAL specimens only), is one component of a comprehensive MRSA colonization surveillance program. It is not intended to diagnose MRSA infection nor to guide or monitor treatment for MRSA infections.      Labs: Basic Metabolic  Panel:  Recent Labs Lab 08/10/15 0550 08/12/15 1353 08/12/15 1637 08/13/15 0055 08/13/15 0531  NA 140 130* 135 137 136  K 4.2 4.2 3.6 3.9 3.8  CL 108 94* 102 104 105  CO2 25 21* '22 25 24  ' GLUCOSE 44* 476* 151* 73 84  BUN 17 34* 33* 31* 29*  CREATININE 1.01 2.00* 1.60* 1.36* 1.60*  CALCIUM 8.5* 9.2 8.8* 8.7* 8.2*  MG  --   --  2.3  --   --   PHOS  --   --  4.3  --   --    Liver Function Tests:  Recent Labs Lab 08/07/15 1521 08/08/15 0053 08/12/15 1353 08/12/15 1637 08/13/15 0531  AST 38 28 35 29 26  ALT 40 26 33 29 20  ALKPHOS 108 78 93 83 68  BILITOT 0.5 0.4 0.4 0.4 0.6  PROT 7.9 5.4* 7.0 6.4* 5.6*  ALBUMIN 4.3 2.8* 3.8 3.4* 2.8*   No results for input(s): LIPASE, AMYLASE in the last 168 hours. No results for input(s): AMMONIA in the last 168 hours. CBC:  Recent Labs Lab 08/07/15 1521 08/07/15 1850 08/08/15 0053 08/08/15 0645 08/12/15 1353 08/13/15 0531  WBC 10.0  --  7.5 7.1 14.3* 10.0  NEUTROABS 5.7  --  3.6  --  10.6*  --   HGB 11.7* 12.9* 9.0* 9.3* 10.1* 8.7*  HCT 35.8* 38.0* 27.2* 28.9* 29.0* 25.1*  MCV 92.0  --  92.2 93.5 87.1 87.5  PLT 208  --  178 181 276 217   Cardiac Enzymes:  Recent Labs Lab 08/08/15 0053 08/08/15 0645  TROPONINI <0.03 <0.03   BNP: BNP (last 3 results) No results for input(s): BNP in the last 8760 hours.  ProBNP (last 3 results) No results for input(s): PROBNP in the last 8760 hours.  CBG:  Recent Labs Lab 08/13/15 1707 08/13/15 1742 08/13/15 2025 08/14/15 0533 08/14/15 0723  GLUCAP 60* 86 65 178* 179*

## 2015-08-14 NOTE — Discharge Instructions (Signed)
Hyperglycemia °Hyperglycemia occurs when the glucose (sugar) in your blood is too high. Hyperglycemia can happen for many reasons, but it most often happens to people who do not know they have diabetes or are not managing their diabetes properly.  °CAUSES  °Whether you have diabetes or not, there are other causes of hyperglycemia. Hyperglycemia can occur when you have diabetes, but it can also occur in other situations that you might not be as aware of, such as: °Diabetes °· If you have diabetes and are having problems controlling your blood glucose, hyperglycemia could occur because of some of the following reasons: °¨ Not following your meal plan. °¨ Not taking your diabetes medications or not taking it properly. °¨ Exercising less or doing less activity than you normally do. °¨ Being sick. °Pre-diabetes °· This cannot be ignored. Before people develop Type 2 diabetes, they almost always have "pre-diabetes." This is when your blood glucose levels are higher than normal, but not yet high enough to be diagnosed as diabetes. Research has shown that some long-term damage to the body, especially the heart and circulatory system, may already be occurring during pre-diabetes. If you take action to manage your blood glucose when you have pre-diabetes, you may delay or prevent Type 2 diabetes from developing. °Stress °· If you have diabetes, you may be "diet" controlled or on oral medications or insulin to control your diabetes. However, you may find that your blood glucose is higher than usual in the hospital whether you have diabetes or not. This is often referred to as "stress hyperglycemia." Stress can elevate your blood glucose. This happens because of hormones put out by the body during times of stress. If stress has been the cause of your high blood glucose, it can be followed regularly by your caregiver. That way he/she can make sure your hyperglycemia does not continue to get worse or progress to  diabetes. °Steroids °· Steroids are medications that act on the infection fighting system (immune system) to block inflammation or infection. One side effect can be a rise in blood glucose. Most people can produce enough extra insulin to allow for this rise, but for those who cannot, steroids make blood glucose levels go even higher. It is not unusual for steroid treatments to "uncover" diabetes that is developing. It is not always possible to determine if the hyperglycemia will go away after the steroids are stopped. A special blood test called an A1c is sometimes done to determine if your blood glucose was elevated before the steroids were started. °SYMPTOMS °· Thirsty. °· Frequent urination. °· Dry mouth. °· Blurred vision. °· Tired or fatigue. °· Weakness. °· Sleepy. °· Tingling in feet or leg. °DIAGNOSIS  °Diagnosis is made by monitoring blood glucose in one or all of the following ways: °· A1c test. This is a chemical found in your blood. °· Fingerstick blood glucose monitoring. °· Laboratory results. °TREATMENT  °First, knowing the cause of the hyperglycemia is important before the hyperglycemia can be treated. Treatment may include, but is not be limited to: °· Education. °· Change or adjustment in medications. °· Change or adjustment in meal plan. °· Treatment for an illness, infection, etc. °· More frequent blood glucose monitoring. °· Change in exercise plan. °· Decreasing or stopping steroids. °· Lifestyle changes. °HOME CARE INSTRUCTIONS  °· Test your blood glucose as directed. °· Exercise regularly. Your caregiver will give you instructions about exercise. Pre-diabetes or diabetes which comes on with stress is helped by exercising. °· Eat wholesome,   balanced meals. Eat often and at regular, fixed times. Your caregiver or nutritionist will give you a meal plan to guide your sugar intake. °· Being at an ideal weight is important. If needed, losing as little as 10 to 15 pounds may help improve blood  glucose levels. °SEEK MEDICAL CARE IF:  °· You have questions about medicine, activity, or diet. °· You continue to have symptoms (problems such as increased thirst, urination, or weight gain). °SEEK IMMEDIATE MEDICAL CARE IF:  °· You are vomiting or have diarrhea. °· Your breath smells fruity. °· You are breathing faster or slower. °· You are very sleepy or incoherent. °· You have numbness, tingling, or pain in your feet or hands. °· You have chest pain. °· Your symptoms get worse even though you have been following your caregiver's orders. °· If you have any other questions or concerns. °  °This information is not intended to replace advice given to you by your health care provider. Make sure you discuss any questions you have with your health care provider. °  °Document Released: 03/25/2001 Document Revised: 12/22/2011 Document Reviewed: 06/05/2015 °Elsevier Interactive Patient Education ©2016 [employer] ° °

## 2015-08-14 NOTE — Care Management Note (Signed)
Case Management Note  Patient Details  Name: [name] [name] MRN: [id number] Date of Birth: Aug 22, 1946  Subjective/Objective: PT-recc HHPT. [facility] chosen. [facility] rep Kristen aware of HHPT order, & d/c today.                   Action/Plan:d/c home w/HHC.   Expected Discharge Date:   (UNKNOWN)               Expected Discharge Plan:  [place]  In-House Referral:  NA  Discharge planning Services  CM Consult  Post Acute Care Choice:  NA Choice offered to:  NA, Patient  DME Arranged:    DME Agency:     HH Arranged:  PT [place]:  [place]  Status of Service:  Completed, signed off  Medicare Important Message Given:    Date Medicare IM Given:    Medicare IM give by:    Date Additional Medicare IM Given:    Additional Medicare Important Message give by:     If discussed at [place] of Stay Meetings, dates discussed:    Additional Comments:  Dessa Phi, RN 08/14/2015, 11:12 AM

## 2015-08-14 NOTE — Telephone Encounter (Signed)
Tried calling pt to set-up TCM appt no answer LMOM RTC.../lmb 

## 2015-08-15 NOTE — Telephone Encounter (Signed)
Called pt concerning TCM appt completed call below  Transition Care Management Follow-up Telephone Call   Date discharged? 08/14/15  How have you been since you were released from the hospital? Pt states he is alright   Do you understand why you were in the hospital? YES   Do you understand the discharge instructions? YES, went over with sister [name]   Where were you discharged to? Home   Items Reviewed:  Medications reviewed: YES  Allergies reviewed: YES  Dietary changes reviewed: NO  Referrals reviewed: NO REFERRAL NEEDED   Functional Questionnaire:   Activities of Daily Living (ADLs):   SISTER states he are independent in the following: ambulation, bathing and hygiene, feeding, continence, grooming, toileting and dressing States he does well for himself doesn't require assistance    Any transportation issues/concerns?: NO   Any patient concerns? NO   Confirmed importance and date/time of follow-up visits scheduled YES, made appt 08/21/15  Provider Appointment booked with Terri Piedra  Confirmed with patient if condition begins to worsen call PCP or go to the ER.  Patient was given the office number and encouraged to call back with question or concerns.  : YES

## 2015-08-21 ENCOUNTER — Inpatient Hospital Stay: Payer: Medicare HMO | Admitting: Family

## 2015-08-21 ENCOUNTER — Inpatient Hospital Stay (HOSPITAL_COMMUNITY)
Admission: EM | Admit: 2015-08-21 | Discharge: 2015-08-24 | DRG: 637 | Disposition: A | Discharge status: Skilled Nursing Facility | Payer: Medicare HMO | Attending: Internal Medicine | Admitting: Internal Medicine

## 2015-08-21 ENCOUNTER — Encounter (HOSPITAL_COMMUNITY): Payer: Self-pay

## 2015-08-21 DIAGNOSIS — E875 Hyperkalemia: Secondary | ICD-10-CM | POA: Diagnosis present

## 2015-08-21 DIAGNOSIS — E1121 Type 2 diabetes mellitus with diabetic nephropathy: Secondary | ICD-10-CM | POA: Diagnosis present

## 2015-08-21 DIAGNOSIS — E1140 Type 2 diabetes mellitus with diabetic neuropathy, unspecified: Secondary | ICD-10-CM | POA: Diagnosis present

## 2015-08-21 DIAGNOSIS — N179 Acute kidney failure, unspecified: Secondary | ICD-10-CM | POA: Diagnosis present

## 2015-08-21 DIAGNOSIS — G9341 Metabolic encephalopathy: Secondary | ICD-10-CM | POA: Diagnosis present

## 2015-08-21 DIAGNOSIS — Z8673 Personal history of transient ischemic attack (TIA), and cerebral infarction without residual deficits: Secondary | ICD-10-CM | POA: Diagnosis not present

## 2015-08-21 DIAGNOSIS — F0390 Unspecified dementia without behavioral disturbance: Secondary | ICD-10-CM | POA: Diagnosis present

## 2015-08-21 DIAGNOSIS — D638 Anemia in other chronic diseases classified elsewhere: Secondary | ICD-10-CM | POA: Diagnosis present

## 2015-08-21 DIAGNOSIS — Z96641 Presence of right artificial hip joint: Secondary | ICD-10-CM | POA: Diagnosis present

## 2015-08-21 DIAGNOSIS — Z7982 Long term (current) use of aspirin: Secondary | ICD-10-CM

## 2015-08-21 DIAGNOSIS — E870 Hyperosmolality and hypernatremia: Secondary | ICD-10-CM | POA: Diagnosis present

## 2015-08-21 DIAGNOSIS — N183 Chronic kidney disease, stage 3 unspecified: Secondary | ICD-10-CM | POA: Diagnosis present

## 2015-08-21 DIAGNOSIS — E871 Hypo-osmolality and hyponatremia: Secondary | ICD-10-CM | POA: Diagnosis present

## 2015-08-21 DIAGNOSIS — F17210 Nicotine dependence, cigarettes, uncomplicated: Secondary | ICD-10-CM | POA: Diagnosis present

## 2015-08-21 DIAGNOSIS — E860 Dehydration: Secondary | ICD-10-CM | POA: Diagnosis present

## 2015-08-21 DIAGNOSIS — E1165 Type 2 diabetes mellitus with hyperglycemia: Secondary | ICD-10-CM | POA: Diagnosis present

## 2015-08-21 DIAGNOSIS — Z9119 Patient's noncompliance with other medical treatment and regimen: Secondary | ICD-10-CM | POA: Diagnosis not present

## 2015-08-21 DIAGNOSIS — I129 Hypertensive chronic kidney disease with stage 1 through stage 4 chronic kidney disease, or unspecified chronic kidney disease: Secondary | ICD-10-CM | POA: Diagnosis present

## 2015-08-21 DIAGNOSIS — G9340 Encephalopathy, unspecified: Secondary | ICD-10-CM | POA: Diagnosis present

## 2015-08-21 DIAGNOSIS — E1122 Type 2 diabetes mellitus with diabetic chronic kidney disease: Secondary | ICD-10-CM | POA: Diagnosis present

## 2015-08-21 DIAGNOSIS — Z794 Long term (current) use of insulin: Secondary | ICD-10-CM | POA: Diagnosis not present

## 2015-08-21 DIAGNOSIS — E1111 Type 2 diabetes mellitus with ketoacidosis with coma: Secondary | ICD-10-CM

## 2015-08-21 DIAGNOSIS — I10 Essential (primary) hypertension: Secondary | ICD-10-CM | POA: Diagnosis not present

## 2015-08-21 DIAGNOSIS — IMO0002 Reserved for concepts with insufficient information to code with codable children: Secondary | ICD-10-CM

## 2015-08-21 DIAGNOSIS — Z0289 Encounter for other administrative examinations: Secondary | ICD-10-CM

## 2015-08-21 LAB — CBC WITH DIFFERENTIAL/PLATELET
Basophils Absolute: 0 10*3/uL (ref 0.0–0.1)
Basophils Relative: 0 %
Eosinophils Absolute: 0 10*3/uL (ref 0.0–0.7)
Eosinophils Relative: 0 %
HCT: 32 % — ABNORMAL LOW (ref 39.0–52.0)
Hemoglobin: 9.7 g/dL — ABNORMAL LOW (ref 13.0–17.0)
Lymphocytes Relative: 7 %
Lymphs Abs: 0.9 10*3/uL (ref 0.7–4.0)
MCH: 30.3 pg (ref 26.0–34.0)
MCHC: 30.3 g/dL (ref 30.0–36.0)
MCV: 100 fL (ref 78.0–100.0)
Monocytes Absolute: 1.1 10*3/uL — ABNORMAL HIGH (ref 0.1–1.0)
Monocytes Relative: 9 %
Neutro Abs: 11 10*3/uL — ABNORMAL HIGH (ref 1.7–7.7)
Neutrophils Relative %: 84 %
Platelets: 245 10*3/uL (ref 150–400)
RBC: 3.2 MIL/uL — ABNORMAL LOW (ref 4.22–5.81)
RDW: 15.4 % (ref 11.5–15.5)
WBC: 13 10*3/uL — ABNORMAL HIGH (ref 4.0–10.5)

## 2015-08-21 LAB — URINALYSIS, ROUTINE W REFLEX MICROSCOPIC
Bilirubin Urine: NEGATIVE
Glucose, UA: >1000 mg/dL — AB
Ketones, ur: 40 mg/dL — AB
Leukocytes, UA: NEGATIVE
Nitrite: NEGATIVE
Protein, ur: 30 mg/dL — AB
Specific Gravity, Urine: 1.021 (ref 1.005–1.030)
Urobilinogen, UA: 0.2 mg/dL (ref 0.0–1.0)
pH: 5 (ref 5.0–8.0)

## 2015-08-21 LAB — RAPID URINE DRUG SCREEN, HOSP PERFORMED
Amphetamines: NOT DETECTED
Barbiturates: NOT DETECTED
Benzodiazepines: POSITIVE — AB
Cocaine: NOT DETECTED
Opiates: POSITIVE — AB
Tetrahydrocannabinol: NOT DETECTED

## 2015-08-21 LAB — BASIC METABOLIC PANEL
Anion gap: 24 — ABNORMAL HIGH (ref 5–15)
Anion gap: 20 — ABNORMAL HIGH (ref 5–15)
BUN: 49 mg/dL — ABNORMAL HIGH (ref 6–20)
BUN: 47 mg/dL — ABNORMAL HIGH (ref 6–20)
CO2: 10 mmol/L — ABNORMAL LOW (ref 22–32)
CO2: 11 mmol/L — ABNORMAL LOW (ref 22–32)
Calcium: 8.3 mg/dL — ABNORMAL LOW (ref 8.9–10.3)
Calcium: 8.7 mg/dL — ABNORMAL LOW (ref 8.9–10.3)
Chloride: 97 mmol/L — ABNORMAL LOW (ref 101–111)
Chloride: 106 mmol/L (ref 101–111)
Creatinine, Ser: 3.26 mg/dL — ABNORMAL HIGH (ref 0.61–1.24)
Creatinine, Ser: 3.28 mg/dL — ABNORMAL HIGH (ref 0.61–1.24)
GFR calc Af Amer: 21 mL/min — ABNORMAL LOW (ref >60)
GFR calc Af Amer: 20 mL/min — ABNORMAL LOW (ref >60)
GFR calc non Af Amer: 18 mL/min — ABNORMAL LOW (ref >60)
GFR calc non Af Amer: 18 mL/min — ABNORMAL LOW (ref >60)
Glucose, Bld: 991 mg/dL (ref 65–99)
Glucose, Bld: 435 mg/dL — ABNORMAL HIGH (ref 65–99)
Potassium: 7.1 mmol/L (ref 3.5–5.1)
Potassium: 4.5 mmol/L (ref 3.5–5.1)
Sodium: 131 mmol/L — ABNORMAL LOW (ref 135–145)
Sodium: 137 mmol/L (ref 135–145)

## 2015-08-21 LAB — URINE MICROSCOPIC-ADD ON

## 2015-08-21 LAB — COMPREHENSIVE METABOLIC PANEL
ALT: 29 U/L (ref 17–63)
AST: 30 U/L (ref 15–41)
Albumin: 3.7 g/dL (ref 3.5–5.0)
Alkaline Phosphatase: 115 U/L (ref 38–126)
Anion gap: 26 — ABNORMAL HIGH (ref 5–15)
BUN: 50 mg/dL — ABNORMAL HIGH (ref 6–20)
CO2: 11 mmol/L — ABNORMAL LOW (ref 22–32)
Calcium: 8.8 mg/dL — ABNORMAL LOW (ref 8.9–10.3)
Chloride: 91 mmol/L — ABNORMAL LOW (ref 101–111)
Creatinine, Ser: 3.39 mg/dL — ABNORMAL HIGH (ref 0.61–1.24)
GFR calc Af Amer: 20 mL/min — ABNORMAL LOW (ref >60)
GFR calc non Af Amer: 17 mL/min — ABNORMAL LOW (ref >60)
Glucose, Bld: 1028 mg/dL (ref 65–99)
Potassium: 7.1 mmol/L (ref 3.5–5.1)
Sodium: 128 mmol/L — ABNORMAL LOW (ref 135–145)
Total Bilirubin: 1.6 mg/dL — ABNORMAL HIGH (ref 0.3–1.2)
Total Protein: 6.9 g/dL (ref 6.5–8.1)

## 2015-08-21 LAB — GLUCOSE, CAPILLARY
Glucose-Capillary: >600 mg/dL (ref 65–99)
Glucose-Capillary: >600 mg/dL (ref 65–99)
Glucose-Capillary: 566 mg/dL (ref 65–99)
Glucose-Capillary: 418 mg/dL — ABNORMAL HIGH (ref 65–99)
Glucose-Capillary: 330 mg/dL — ABNORMAL HIGH (ref 65–99)
Glucose-Capillary: 235 mg/dL — ABNORMAL HIGH (ref 65–99)
Glucose-Capillary: 168 mg/dL — ABNORMAL HIGH (ref 65–99)

## 2015-08-21 LAB — LIPASE, BLOOD: Lipase: 27 U/L (ref 11–51)

## 2015-08-21 LAB — MRSA PCR SCREENING: MRSA by PCR: NEGATIVE

## 2015-08-21 LAB — CBG MONITORING, ED: Glucose-Capillary: >600 mg/dL (ref 65–99)

## 2015-08-21 MED ORDER — DEXTROSE-NACL 5-0.45 % IV SOLN
INTRAVENOUS | Status: DC
  Administered 2015-08-21: 23:00:00 via INTRAVENOUS

## 2015-08-21 MED ORDER — FLUOXETINE HCL 20 MG PO CAPS
20.0000 mg | ORAL_CAPSULE | Freq: Every day | ORAL | Status: DC
  Administered 2015-08-21 – 2015-08-24 (×4): 20 mg via ORAL
  Filled 2015-08-21 (×4): qty 1

## 2015-08-21 MED ORDER — ENOXAPARIN SODIUM 30 MG/0.3ML ~~LOC~~ SOLN
30.0000 mg | SUBCUTANEOUS | Status: DC
  Administered 2015-08-21 – 2015-08-23 (×3): 30 mg via SUBCUTANEOUS
  Filled 2015-08-21 (×4): qty 0.3

## 2015-08-21 MED ORDER — ASPIRIN EC 325 MG PO TBEC
325.0000 mg | DELAYED_RELEASE_TABLET | Freq: Every day | ORAL | Status: DC
  Administered 2015-08-21 – 2015-08-24 (×4): 325 mg via ORAL
  Filled 2015-08-21 (×4): qty 1

## 2015-08-21 MED ORDER — OXYCODONE HCL 5 MG PO TABS
20.0000 mg | ORAL_TABLET | Freq: Two times a day (BID) | ORAL | Status: DC | PRN
  Administered 2015-08-23 – 2015-08-24 (×3): 20 mg via ORAL
  Filled 2015-08-21 (×3): qty 4

## 2015-08-21 MED ORDER — SODIUM CHLORIDE 0.9 % IV SOLN
1000.0000 mL | INTRAVENOUS | Status: DC
Start: 2015-08-21 — End: 2015-08-22
  Administered 2015-08-21: 1000 mL via INTRAVENOUS

## 2015-08-21 MED ORDER — CALCIUM GLUCONATE 10 % IV SOLN
1.0000 g | Freq: Once | INTRAVENOUS | Status: AC
  Administered 2015-08-21: 1 g via INTRAVENOUS
  Filled 2015-08-21: qty 10

## 2015-08-21 MED ORDER — SODIUM CHLORIDE 0.9 % IV SOLN
INTRAVENOUS | Status: DC
  Administered 2015-08-21: 5.4 [IU]/h via INTRAVENOUS
  Filled 2015-08-21: qty 2.5

## 2015-08-21 MED ORDER — AMLODIPINE BESYLATE 10 MG PO TABS
10.0000 mg | ORAL_TABLET | Freq: Every day | ORAL | Status: DC
  Administered 2015-08-22 – 2015-08-24 (×3): 10 mg via ORAL
  Filled 2015-08-21 (×3): qty 1

## 2015-08-21 MED ORDER — ALPRAZOLAM 0.5 MG PO TABS: 0.5000 mg | ORAL_TABLET | Freq: Two times a day (BID) | ORAL | Status: DC | PRN

## 2015-08-21 MED ORDER — SODIUM CHLORIDE 0.9 % IV SOLN
1000.0000 mL | Freq: Once | INTRAVENOUS | Status: AC
  Administered 2015-08-21: 1000 mL via INTRAVENOUS

## 2015-08-21 MED ORDER — ONDANSETRON HCL 4 MG/2ML IJ SOLN
4.0000 mg | Freq: Four times a day (QID) | INTRAMUSCULAR | Status: DC | PRN
Start: 2015-08-21 — End: 2015-08-24

## 2015-08-21 MED ORDER — ATORVASTATIN CALCIUM 10 MG PO TABS
10.0000 mg | ORAL_TABLET | Freq: Every day | ORAL | Status: DC
  Administered 2015-08-21 – 2015-08-24 (×4): 10 mg via ORAL
  Filled 2015-08-21 (×5): qty 1

## 2015-08-21 MED ORDER — SODIUM POLYSTYRENE SULFONATE 15 GM/60ML PO SUSP
30.0000 g | Freq: Once | ORAL | Status: AC
  Administered 2015-08-21: 30 g via ORAL
  Filled 2015-08-21: qty 120

## 2015-08-21 MED ORDER — DEXTROSE 50 % IV SOLN
25.0000 mL | INTRAVENOUS | Status: DC | PRN
  Filled 2015-08-21: qty 50

## 2015-08-21 MED ORDER — SODIUM CHLORIDE 0.9 % IV SOLN: INTRAVENOUS | Status: DC

## 2015-08-21 MED ORDER — INSULIN REGULAR BOLUS VIA INFUSION
0.0000 [IU] | Freq: Three times a day (TID) | INTRAVENOUS | Status: DC
  Filled 2015-08-21: qty 10

## 2015-08-21 MED ORDER — SODIUM CHLORIDE 0.9 % IV BOLUS (SEPSIS)
1000.0000 mL | Freq: Once | INTRAVENOUS | Status: AC
  Administered 2015-08-21: 1000 mL via INTRAVENOUS

## 2015-08-21 MED ORDER — SODIUM CHLORIDE 0.9 % IV SOLN
1.0000 g | Freq: Once | INTRAVENOUS | Status: AC
  Administered 2015-08-21: 1 g via INTRAVENOUS
  Filled 2015-08-21: qty 10

## 2015-08-21 MED ORDER — ACETAMINOPHEN 325 MG PO TABS
650.0000 mg | ORAL_TABLET | Freq: Four times a day (QID) | ORAL | Status: DC | PRN
  Administered 2015-08-23 – 2015-08-24 (×2): 650 mg via ORAL
  Filled 2015-08-21 (×3): qty 2

## 2015-08-21 MED ORDER — DEXTROSE-NACL 5-0.45 % IV SOLN
INTRAVENOUS | Status: DC
Start: 2015-08-21 — End: 2015-08-21

## 2015-08-21 MED ORDER — SODIUM CHLORIDE 0.9 % IV SOLN
INTRAVENOUS | Status: DC
  Filled 2015-08-21: qty 2.5

## 2015-08-21 MED ORDER — ONDANSETRON HCL 4 MG PO TABS: 4.0000 mg | ORAL_TABLET | Freq: Four times a day (QID) | ORAL | Status: DC | PRN

## 2015-08-21 NOTE — H&P (Addendum)
[facility] History and Physical  [name] FVC:[id number] DOB: [birth year] DOA: 08/21/2015  Referring physician: EDP PCP: Mauricio Po, [name]   Chief Complaint: dizzy, disoriented, CBG >600  HPI: [name] [name] is a 69 y.o. male with uncontrolled DM, CKD3, ? Memory/cognitive problems, h/o CVA, anemia of chronic disease, presents to the ER with the above complaints. Patient was just discharged from [facility] 1 week ago after treatment for diabetic hyperosmolar nonketotic state, his insulin was adjusted at the time. His hemoglobin A1c was 13.4 Patient is confused and unable to provide any meaningful history. I called his sister [name] reports that He's been living with her since discharge last time, she thinks he may have some memory problems and this worsens in the setting of uncontrolled blood sugars. His sister for a tells me that last night his blood sugars were in the 500s, one of his sisters administered insulin and subsequently his blood sugars improved. They're not sure whether he took any insulin this morning. This morning he appeared more confused, was dizzy, and his blood sugar read as too high Patient denies any fevers or chills. No cough congestion shortness of breath nausea vomiting abdominal pain diarrhea dysuria etc. In the emergency room he was noted to have blood glucose of 1028, sodium of 128, potassium 7.1, bicarbonate of 11, WBC of 13 K  Review of Systems: Positives bolded, limited due to confusion Constitutional: Confusion No weight loss, night sweats, Fevers, chills, fatigue.  HEENT:  No headaches, Difficulty swallowing,Tooth/dental problems,Sore throat,  No sneezing, itching, ear ache, nasal congestion, post nasal drip,  Cardio-vascular:  No chest pain, Orthopnea, PND, swelling in lower extremities, anasarca, dizziness, palpitations  GI:  No heartburn, indigestion, abdominal pain, nausea, vomiting, diarrhea, change in bowel habits, loss of appetite    Resp:  No shortness of breath with exertion or at rest. No excess mucus, no productive cough, No non-productive cough, No coughing up of blood.No change in color of mucus.No wheezing.No chest wall deformity  Skin:  no rash or lesions.  GU:  no dysuria, change in color of urine, no urgency or frequency. No flank pain.  Musculoskeletal:  No joint pain or swelling. No decreased range of motion. No back pain.  Psych:  No change in mood or affect. No depression or anxiety. No memory loss.   Past Medical History  Diagnosis Date  . Diabetes mellitus without complication ([place])   . Hypertension   . Hernia   . Back pain   . Seizures ([place])   . Diabetic neuropathy ([place])   . Stroke [facility])    Past Surgical History  Procedure Laterality Date  . Hip arthroplasty Right 02/05/2013    Procedure: ARTHROPLASTY BIPOLAR HIP;  Surgeon: [name], MD;  Location: [facility];  Service: Orthopedics;  Laterality: Right;   Social History:  reports that he has been smoking Cigarettes.  He has a 7.5 pack-year smoking history. He has never used smokeless tobacco. He reports that he drinks alcohol. He reports that he does not use illicit drugs.  No Known Allergies  Family History  Problem Relation Age of Onset  . Diabetes Mother   . Alzheimer's disease Mother   . Hypertension Mother   . Hyperlipidemia Father   . Hypertension Father   . Healthy Maternal Grandmother   . Pneumonia Maternal Grandfather     Prior to Admission medications   Medication Sig Start Date End Date Taking? Authorizing Provider  acetaminophen (TYLENOL) 325 MG tablet Take 2 tablets (650  mg total) by mouth every 6 (six) hours as needed for mild pain (or Fever >/= 101). 08/14/15  Yes Robbie Lis, MD  alprazolam [name]) 2 MG tablet Take 1 mg by mouth 2 (two) times daily as needed for anxiety.  07/23/15  Yes Historical Provider, MD  amLODipine (NORVASC) 10 MG tablet Take 1 tablet (10 mg total) by mouth daily. 08/14/15  Yes Robbie Lis,  MD  aspirin EC 325 MG tablet Take 1 tablet (325 mg total) by mouth daily. 06/08/15  Yes Rosalin Hawking, MD  atorvastatin (LIPITOR) 10 MG tablet Take 1 tablet (10 mg total) by mouth daily at 6 PM. Patient taking differently: Take 10 mg by mouth daily.  03/23/15  Yes [name], DO  blood glucose meter kit and supplies Dispense based on patient and insurance preference. Use up to four times daily as directed. (FOR ICD-9 250.00, 250.01). 07/04/15  Yes Donita Brooks, NP  FLUoxetine (PROZAC) 20 MG capsule Take 20 mg by mouth daily.   Yes Historical Provider, MD  folic acid (FOLVITE) 1 MG tablet Take 1 mg by mouth daily.   Yes Historical Provider, MD  insulin aspart protamine- aspart (NOVOLOG MIX 70/30) (70-30) 100 UNIT/ML injection Inject 0.1 mLs (10 Units total) into the skin 2 (two) times daily with a meal. Patient taking differently: Inject 22 Units into the skin 2 (two) times daily with a meal.  08/14/15  Yes Robbie Lis, MD  Oxycodone HCl 20 MG TABS Take 20 mg by mouth 2 (two) times daily as needed (pain).  05/24/15  Yes Historical Provider, MD  PRESCRIPTION MEDICATION Place 1 Dose into the right eye every 30 (thirty) days. Eye injection   Yes Historical Provider, MD  feeding supplement, GLUCERNA SHAKE, (GLUCERNA SHAKE) LIQD Take 237 mLs by mouth 2 (two) times daily between meals. Patient not taking: Reported on 08/21/2015 08/14/15   Robbie Lis, MD   Physical Exam: Filed Vitals:   08/21/15 1400 08/21/15 1415 08/21/15 1430 08/21/15 1500  BP: 139/59  121/42 123/55  Pulse:  84 86 86  Temp:      TempSrc:      Resp: '17 17 16 11  ' SpO2:  94% 98% 100%    Wt Readings from Last 3 Encounters:  08/13/15 65.137 kg (143 lb 9.6 oz)  08/09/15 71.9 kg (158 lb 8.2 oz)  07/08/15 78.835 kg (173 lb 12.8 oz)    General:  Appears calm and comfortable, confused, oriented to self only, talkative Eyes: Pupils small but reactive, normal lids, irises & conjunctiva ENT: grossly normal lips & tongue Neck: no LAD,  masses or thyromegaly Cardiovascular: RRR, no m/r/g. No LE edema. Telemetry: SR, no arrhythmias  Respiratory: CTA bilaterally, no w/r/r. Normal respiratory effort. Abdomen: soft, nt, nd, bowel sounds present, midline abdominal scar Skin: no rash or induration seen on limited exam Musculoskeletal: grossly normal tone BUE/BLE Psychiatric: confused Neurologic: Confused , grossly non-focal.          Labs on Admission:  Basic Metabolic Panel:  Recent Labs Lab 08/21/15 1335  NA 128*  K 7.1*  CL 91*  CO2 11*  GLUCOSE 1028*  BUN 50*  CREATININE 3.39*  CALCIUM 8.8*   Liver Function Tests:  Recent Labs Lab 08/21/15 1335  AST 30  ALT 29  ALKPHOS 115  BILITOT 1.6*  PROT 6.9  ALBUMIN 3.7    Recent Labs Lab 08/21/15 1335  LIPASE 27   No results for input(s): AMMONIA in the last  168 hours. CBC:  Recent Labs Lab 08/21/15 1335  WBC 13.0*  NEUTROABS 11.0*  HGB 9.7*  HCT 32.0*  MCV 100.0  PLT 245   Cardiac Enzymes: No results for input(s): CKTOTAL, CKMB, CKMBINDEX, TROPONINI in the last 168 hours.  BNP (last 3 results) No results for input(s): BNP in the last 8760 hours.  ProBNP (last 3 results) No results for input(s): PROBNP in the last 8760 hours.  CBG:  Recent Labs Lab 08/21/15 1209  GLUCAP >600*    Radiological Exams on Admission: No results found.  EKG: Independently reviewed.   Assessment/Plan       DKA (diabetic ketoacidoses)  -I suspect he has cognitive/memory issues at baseline and hence unable to reliably manage his diabetes, insulin administration -Admit to stepdown -IV insulin per Glucomander protocol, aggressive fluid resuscitation -Check B met every 4 hours -Hemoglobin A1c from last week was 13.4 -Diabetes coordinator consult -Suspect he will need some sort of placement or assistance  AKI on CKD 3 -Due to volume depletion, dehydration from DKA -Aggressive hydration -Place Foley, baseline creatinine was 1.6 one week ago -Will  need renal ultrasound if creatinine doesn't start improving significantly  Hyperkalemia  -Due to AKI -Without peak T wave changes  -Calcium gluconate 1 now , expect this to correct soon with IV insulin , monitor B met closely  -Kayexalate if doesn't improve with next bmet  Hyponatremia -Due to pseudohyponatremia and volume loss  -Hydrate, treat DKA, monitor   Metabolic encephalopathy -Due to DKA, with some background cognitive dysfunction -Expect this to improve with correction of DKA and improvement kidney function -TSH was normal in September, check B 12   Benign essential HTN -Stable monitor, resume amlodipine  History of CVA -Continue aspirin, statin  Code Status: Full Code DVT Prophylaxis: lovenox Family Communication: none at bedside, called and discussed with his sister Harrell Niehoff she can be reached at [phone] (cell phone) Disposition Plan: stepdown  Time spent: 27mn  Dafney Farler [facility] Pager [phone]

## 2015-08-21 NOTE — ED Notes (Signed)
Bed: WA08 Expected date:  Expected time:  Means of arrival:  Comments: hyperglycemia

## 2015-08-21 NOTE — ED Notes (Signed)
Family at bedside. 

## 2015-08-21 NOTE — ED Provider Notes (Addendum)
CSN: [id number]     Arrival date & time 08/21/15  1154 History   First MD Initiated Contact with Patient 08/21/15 1202     No chief complaint on file.    (Consider location/radiation/quality/duration/timing/severity/associated sxs/prior Treatment) HPI Comments: Patient is a 69 year old male with a history of diabetes, hypertension, stroke and seizure disorder was brought in by EMS today for elevated blood sugar. Patient was recently admitted to the hospital 2 weeks for hyperosmolar coma and acute encephalopathy. Today patient is not accompanied by any family that seems to have altered mental status. He denies any alcohol or drug use but does take oxycodone 2 times daily as needed for pain. Does admit to using his insulin but states that he does not follow a strict diabetic diet. He also endorses that he's had some vomiting and mild abdominal pain but cannot be more specific  The history is provided by the patient.    Past Medical History  Diagnosis Date  . Diabetes mellitus without complication ([place])   . Hypertension   . Hernia   . Back pain   . Seizures ([place])   . Diabetic neuropathy ([place])   . Stroke [facility])    Past Surgical History  Procedure Laterality Date  . Hip arthroplasty Right 02/05/2013    Procedure: ARTHROPLASTY BIPOLAR HIP;  Surgeon: [name], MD;  Location: [facility];  Service: Orthopedics;  Laterality: Right;   Family History  Problem Relation Age of Onset  . Diabetes Mother   . Alzheimer's disease Mother   . Hypertension Mother   . Hyperlipidemia Father   . Hypertension Father   . Healthy Maternal Grandmother   . Pneumonia Maternal Grandfather    Social History  Substance Use Topics  . Smoking status: Current Every Day Smoker -- 0.25 packs/day for 30 years    Types: Cigarettes    Last Attempt to Quit: 01/31/2013  . Smokeless tobacco: Never Used  . Alcohol Use: Yes    Review of Systems  All other systems reviewed and are negative.     Allergies   Review of patient's allergies indicates no known allergies.  Home Medications   Prior to Admission medications   Medication Sig Start Date End Date Taking? Authorizing Provider  acetaminophen (TYLENOL) 325 MG tablet Take 2 tablets (650 mg total) by mouth every 6 (six) hours as needed for mild pain (or Fever >/= 101). 08/14/15   Robbie Lis, MD  alprazolam [name]) 2 MG tablet Take 1 mg by mouth 2 (two) times daily as needed. 07/23/15   Historical Provider, MD  amLODipine (NORVASC) 10 MG tablet Take 1 tablet (10 mg total) by mouth daily. 08/14/15   Robbie Lis, MD  aspirin EC 325 MG tablet Take 1 tablet (325 mg total) by mouth daily. 06/08/15   Rosalin Hawking, MD  atorvastatin (LIPITOR) 10 MG tablet Take 1 tablet (10 mg total) by mouth daily at 6 PM. Patient taking differently: Take 10 mg by mouth daily.  03/23/15   [name], DO  blood glucose meter kit and supplies Dispense based on patient and insurance preference. Use up to four times daily as directed. (FOR ICD-9 250.00, 250.01). 07/04/15   Donita Brooks, NP  feeding supplement, GLUCERNA SHAKE, (GLUCERNA SHAKE) LIQD Take 237 mLs by mouth 2 (two) times daily between meals. 08/14/15   Robbie Lis, MD  FLUoxetine (PROZAC) 20 MG capsule Take 20 mg by mouth daily.    Historical Provider, MD  folic acid (FOLVITE)  1 MG tablet Take 1 mg by mouth daily.    Historical Provider, MD  insulin aspart protamine- aspart (NOVOLOG MIX 70/30) (70-30) 100 UNIT/ML injection Inject 0.1 mLs (10 Units total) into the skin 2 (two) times daily with a meal. 08/14/15   Robbie Lis, MD  Oxycodone HCl 20 MG TABS Take 20 mg by mouth 2 (two) times daily as needed (pain).  05/24/15   Historical Provider, MD  PRESCRIPTION MEDICATION Place 1 Dose into the right eye every 30 (thirty) days. Eye injection    Historical Provider, MD   BP 99/52 mmHg  Pulse 98  Temp(Src) 98.4 F (36.9 C) (Oral)  Resp 18  SpO2 96% Physical Exam  Constitutional: He is oriented to person,  place, and time. He appears well-developed and well-nourished. No distress.  Smells of ketones  HENT:  Head: Normocephalic and atraumatic.  Mouth/Throat: Oropharynx is clear and moist. Mucous membranes are dry.  Eyes: Conjunctivae and EOM are normal.  Pupils are 2 mm and minimally reactive  Neck: Normal range of motion. Neck supple.  Cardiovascular: Normal rate, regular rhythm and intact distal pulses.   No murmur heard. Pulmonary/Chest: Effort normal and breath sounds normal. No respiratory distress. He has no wheezes. He has no rales.  Abdominal: Soft. He exhibits no distension. There is tenderness. There is no rebound and no guarding.  Mild diffuse tenderness but no focal tenderness  Musculoskeletal: Normal range of motion. He exhibits no edema or tenderness.  Neurological: He is alert and oriented to person, place, and time.  Skin: Skin is warm and dry. No rash noted. No erythema.  Psychiatric: He has a normal mood and affect. His behavior is normal.  Nursing note and vitals reviewed.   ED Course  Procedures (including critical care time) Labs Review Labs Reviewed  CBC WITH DIFFERENTIAL/PLATELET - Abnormal; Notable for the following:    WBC 13.0 (*)    RBC 3.20 (*)    Hemoglobin 9.7 (*)    HCT 32.0 (*)    Neutro Abs 11.0 (*)    Monocytes Absolute 1.1 (*)    All other components within normal limits  COMPREHENSIVE METABOLIC PANEL - Abnormal; Notable for the following:    Sodium 128 (*)    Potassium 7.1 (*)    Chloride 91 (*)    CO2 11 (*)    Glucose, Bld 1028 (*)    BUN 50 (*)    Creatinine, Ser 3.39 (*)    Calcium 8.8 (*)    Total Bilirubin 1.6 (*)    GFR calc non Af Amer 17 (*)    GFR calc Af Amer 20 (*)    Anion gap 26 (*)    All other components within normal limits  BLOOD GAS, VENOUS - Abnormal; Notable for the following:    Bicarbonate 10.3 (*)    Acid-base deficit 17.5 (*)    All other components within normal limits  CBG MONITORING, ED - Abnormal; Notable  for the following:    Glucose-Capillary >600 (*)    All other components within normal limits  LIPASE, BLOOD  URINALYSIS, ROUTINE W REFLEX MICROSCOPIC (NOT AT [facility])  URINE RAPID DRUG SCREEN, HOSP PERFORMED    Imaging Review No results found. I have personally reviewed and evaluated these images and lab results as part of my medical decision-making.   EKG Interpretation   Date/Time:  Tuesday August 21 2015 12:18:32 EST Ventricular Rate:  96 PR Interval:  157 QRS Duration: 104 QT Interval:  386  QTC Calculation: 488 R Axis:   -48 Text Interpretation:  Sinus rhythm LAD, consider left anterior fascicular  block Borderline ST depression, lateral leads Borderline prolonged QT  interval Baseline wander in lead(s) V5 No significant change since last  tracing Confirmed by [facility]  MD, Loree Fee ([id number]) on 08/21/2015 12:53:16  PM      MDM   Final diagnoses:  Diabetic ketoacidosis with coma associated with type 2 diabetes mellitus ([place])  AKI (acute kidney injury) ([place])  Acute encephalopathy    Patient is a 69 year old male presenting today for hyperglycemia. He was recently admitted to the hospital for hyperosmolar coma with acute encephalopathy. Today patient is not accompanied by family but seems to have some acute encephalopathy he also has some pinpoint pupils and does have a prescription for 20 mg of oxycodone on his medication list. When asked if he took the pain medicine he said yes. He denies any alcohol or drug use and he smells of ketones and does endorse some abdominal pain and vomiting. He also states he does not follow a diabetic diet but it's unclear how long his blood sugar has been elevated. Unclear if he is using his insulin correctly.  CBC, CMP, lipase, UA, VBG pending. Patient's blood sugar greater than 600 and he was started on IV fluids  2:52 PM  Labs consistent with a mild leukocytosis of 13,000, stable hemoglobin at 9.7, CMP with evidence of hyperglycemia of  1028 with a hyperkalemia of 7.1 and a new acute kidney injury of 3.39 from a baseline of 1.6. Patient had an anion gap of 26. VBG is consistent with DKA with pH of 7.16 with a bicarbonate of 10. Glucose stabilizer started. Patient has evidence of acute encephalopathy but is maintaining his airway and oxygen saturation is 97%. Will discuss with internal medicine for admission to a stepdown bed.  Pt also given calcium IV but no peaked T-waves.  Started on insulin gtt.    CRITICAL CARE Performed by: Blanchie Dessert Total critical care time: 30 minutes Critical care time was exclusive of separately billable procedures and treating other patients. Critical care was necessary to treat or prevent imminent or life-threatening deterioration. Critical care was time spent personally by me on the following activities: development of treatment plan with patient and/or surrogate as well as nursing, discussions with consultants, evaluation of patient's response to treatment, examination of patient, obtaining history from patient or surrogate, ordering and performing treatments and interventions, ordering and review of laboratory studies, ordering and review of radiographic studies, pulse oximetry and re-evaluation of patient's condition.  Blanchie Dessert, MD 08/21/15 1454  Blanchie Dessert, MD 08/21/15 [phone]

## 2015-08-21 NOTE — ED Notes (Signed)
MD at bedside. EDP PLUNKETT PRESENT

## 2015-08-21 NOTE — ED Notes (Signed)
[place] [phone]

## 2015-08-21 NOTE — ED Notes (Signed)
Pt stood for 10 minutes trying for UA.  Unsuccessful.  Very unsteady on feet

## 2015-08-21 NOTE — ED Notes (Signed)
Attempts x 2 made to start IV and draw blood not successful. Requested [place] IV by Reeves Forth RN

## 2015-08-21 NOTE — ED Notes (Signed)
Per EMS, pt picked up from [facility].  Pt was discharged from hospital recently for hyperglycemia.  Pt was following up with primary MD.  Pt found to be dizzy and disoriented.  MD did not check cbg but called for EMS transport.  EMS found cbg to be greater than 600.  Pt has slurred speech from previous stroke - baseline. Vitals: 108/41, hr 90, resp 20, 100% ra.

## 2015-08-21 NOTE — Progress Notes (Signed)
CRITICAL VALUE ALERT  Critical value received:  Potassium 7.1 with no hemolysis, Glucose 991  Date of notification:  08/21/2015  Time of notification:  1700  Critical value read back:Yes.    Nurse who received alert:  Kennis Carina  MD notified (1st page):  Fanny Bien  Time of first page:  1715  MD notified (2nd page):  Time of second page:  Responding MD:  Fanny Bien  Time MD responded:  [phone]

## 2015-08-21 NOTE — ED Notes (Signed)
MD at bedside. ADMITTING MD PRESENT 

## 2015-08-21 NOTE — ED Notes (Signed)
MATT RN PRESENT ATTEMPT [place] IV AWARE OF NEED FOR BLOOD

## 2015-08-22 DIAGNOSIS — Z794 Long term (current) use of insulin: Secondary | ICD-10-CM

## 2015-08-22 DIAGNOSIS — N183 Chronic kidney disease, stage 3 (moderate): Secondary | ICD-10-CM

## 2015-08-22 DIAGNOSIS — E1165 Type 2 diabetes mellitus with hyperglycemia: Principal | ICD-10-CM

## 2015-08-22 DIAGNOSIS — N179 Acute kidney failure, unspecified: Secondary | ICD-10-CM

## 2015-08-22 DIAGNOSIS — E1121 Type 2 diabetes mellitus with diabetic nephropathy: Secondary | ICD-10-CM

## 2015-08-22 DIAGNOSIS — G9340 Encephalopathy, unspecified: Secondary | ICD-10-CM

## 2015-08-22 DIAGNOSIS — E1310 Other specified diabetes mellitus with ketoacidosis without coma: Secondary | ICD-10-CM

## 2015-08-22 DIAGNOSIS — I10 Essential (primary) hypertension: Secondary | ICD-10-CM

## 2015-08-22 LAB — BASIC METABOLIC PANEL
Anion gap: 14 (ref 5–15)
Anion gap: 9 (ref 5–15)
Anion gap: 10 (ref 5–15)
Anion gap: 14 (ref 5–15)
BUN: 45 mg/dL — ABNORMAL HIGH (ref 6–20)
BUN: 43 mg/dL — ABNORMAL HIGH (ref 6–20)
BUN: 43 mg/dL — ABNORMAL HIGH (ref 6–20)
BUN: 42 mg/dL — ABNORMAL HIGH (ref 6–20)
CO2: 17 mmol/L — ABNORMAL LOW (ref 22–32)
CO2: 21 mmol/L — ABNORMAL LOW (ref 22–32)
CO2: 21 mmol/L — ABNORMAL LOW (ref 22–32)
CO2: 17 mmol/L — ABNORMAL LOW (ref 22–32)
Calcium: 8.9 mg/dL (ref 8.9–10.3)
Calcium: 8.5 mg/dL — ABNORMAL LOW (ref 8.9–10.3)
Calcium: 8.7 mg/dL — ABNORMAL LOW (ref 8.9–10.3)
Calcium: 8.5 mg/dL — ABNORMAL LOW (ref 8.9–10.3)
Chloride: 110 mmol/L (ref 101–111)
Chloride: 111 mmol/L (ref 101–111)
Chloride: 109 mmol/L (ref 101–111)
Chloride: 108 mmol/L (ref 101–111)
Creatinine, Ser: 3 mg/dL — ABNORMAL HIGH (ref 0.61–1.24)
Creatinine, Ser: 2.96 mg/dL — ABNORMAL HIGH (ref 0.61–1.24)
Creatinine, Ser: 2.74 mg/dL — ABNORMAL HIGH (ref 0.61–1.24)
Creatinine, Ser: 2.75 mg/dL — ABNORMAL HIGH (ref 0.61–1.24)
GFR calc Af Amer: 23 mL/min — ABNORMAL LOW (ref >60)
GFR calc Af Amer: 23 mL/min — ABNORMAL LOW (ref >60)
GFR calc Af Amer: 25 mL/min — ABNORMAL LOW (ref >60)
GFR calc Af Amer: 25 mL/min — ABNORMAL LOW (ref >60)
GFR calc non Af Amer: 20 mL/min — ABNORMAL LOW (ref >60)
GFR calc non Af Amer: 20 mL/min — ABNORMAL LOW (ref >60)
GFR calc non Af Amer: 22 mL/min — ABNORMAL LOW (ref >60)
GFR calc non Af Amer: 22 mL/min — ABNORMAL LOW (ref >60)
Glucose, Bld: 117 mg/dL — ABNORMAL HIGH (ref 65–99)
Glucose, Bld: 125 mg/dL — ABNORMAL HIGH (ref 65–99)
Glucose, Bld: 135 mg/dL — ABNORMAL HIGH (ref 65–99)
Glucose, Bld: 202 mg/dL — ABNORMAL HIGH (ref 65–99)
Potassium: 3.7 mmol/L (ref 3.5–5.1)
Potassium: 3.9 mmol/L (ref 3.5–5.1)
Potassium: 3.8 mmol/L (ref 3.5–5.1)
Potassium: 4.1 mmol/L (ref 3.5–5.1)
Sodium: 141 mmol/L (ref 135–145)
Sodium: 141 mmol/L (ref 135–145)
Sodium: 140 mmol/L (ref 135–145)
Sodium: 139 mmol/L (ref 135–145)

## 2015-08-22 LAB — GLUCOSE, CAPILLARY
Glucose-Capillary: 106 mg/dL — ABNORMAL HIGH (ref 65–99)
Glucose-Capillary: 116 mg/dL — ABNORMAL HIGH (ref 65–99)
Glucose-Capillary: 122 mg/dL — ABNORMAL HIGH (ref 65–99)
Glucose-Capillary: 134 mg/dL — ABNORMAL HIGH (ref 65–99)
Glucose-Capillary: 137 mg/dL — ABNORMAL HIGH (ref 65–99)
Glucose-Capillary: 143 mg/dL — ABNORMAL HIGH (ref 65–99)
Glucose-Capillary: 145 mg/dL — ABNORMAL HIGH (ref 65–99)
Glucose-Capillary: 220 mg/dL — ABNORMAL HIGH (ref 65–99)
Glucose-Capillary: 73 mg/dL (ref 65–99)
Glucose-Capillary: 90 mg/dL (ref 65–99)
Glucose-Capillary: 96 mg/dL (ref 65–99)

## 2015-08-22 LAB — CBC
HCT: 25.4 % — ABNORMAL LOW (ref 39.0–52.0)
Hemoglobin: 8.3 g/dL — ABNORMAL LOW (ref 13.0–17.0)
MCH: 30 pg (ref 26.0–34.0)
MCHC: 32.7 g/dL (ref 30.0–36.0)
MCV: 91.7 fL (ref 78.0–100.0)
Platelets: 209 10*3/uL (ref 150–400)
RBC: 2.77 MIL/uL — ABNORMAL LOW (ref 4.22–5.81)
RDW: 14.7 % (ref 11.5–15.5)
WBC: 10.3 10*3/uL (ref 4.0–10.5)

## 2015-08-22 LAB — BLOOD GAS, VENOUS
Acid-base deficit: 17.5 mmol/L — ABNORMAL HIGH (ref 0.0–2.0)
Bicarbonate: 10.3 mEq/L — ABNORMAL LOW (ref 20.0–24.0)
Drawn by: 295031
FIO2: 0.21
O2 Saturation: 73.2 %
Patient temperature: 98.6
TCO2: 9.5 mmol/L (ref 0–100)
pCO2, Ven: 29.6 mmHg — ABNORMAL LOW (ref 45.0–50.0)
pH, Ven: 7.165 — CL (ref 7.250–7.300)
pO2, Ven: 46.8 mmHg — ABNORMAL HIGH (ref 30.0–45.0)

## 2015-08-22 MED ORDER — INSULIN GLARGINE 100 UNIT/ML ~~LOC~~ SOLN
10.0000 [IU] | Freq: Every day | SUBCUTANEOUS | Status: DC
  Administered 2015-08-22 – 2015-08-24 (×3): 10 [IU] via SUBCUTANEOUS
  Filled 2015-08-22 (×3): qty 0.1

## 2015-08-22 MED ORDER — INSULIN ASPART 100 UNIT/ML ~~LOC~~ SOLN: 0.0000 [IU] | Freq: Every day | SUBCUTANEOUS | Status: DC

## 2015-08-22 MED ORDER — DEXTROSE 50 % IV SOLN
1.0000 | INTRAVENOUS | Status: AC
  Administered 2015-08-22: 50 mL via INTRAVENOUS

## 2015-08-22 MED ORDER — DEXTROSE 10 % IV SOLN
INTRAVENOUS | Status: DC
  Administered 2015-08-22: 02:00:00 via INTRAVENOUS
  Filled 2015-08-22: qty 1000

## 2015-08-22 MED ORDER — SODIUM CHLORIDE 0.9 % IV SOLN
1000.0000 mL | INTRAVENOUS | Status: DC
  Administered 2015-08-22 – 2015-08-23 (×3): 1000 mL via INTRAVENOUS

## 2015-08-22 MED ORDER — SODIUM CHLORIDE 0.9 % IV BOLUS (SEPSIS)
500.0000 mL | Freq: Once | INTRAVENOUS | Status: AC
  Administered 2015-08-22: 500 mL via INTRAVENOUS

## 2015-08-22 MED ORDER — INSULIN ASPART 100 UNIT/ML ~~LOC~~ SOLN
0.0000 [IU] | Freq: Three times a day (TID) | SUBCUTANEOUS | Status: DC
  Administered 2015-08-22: 1 [IU] via SUBCUTANEOUS
  Administered 2015-08-22: 3 [IU] via SUBCUTANEOUS
  Administered 2015-08-22 – 2015-08-23 (×3): 1 [IU] via SUBCUTANEOUS
  Administered 2015-08-23: 2 [IU] via SUBCUTANEOUS
  Administered 2015-08-24: 1 [IU] via SUBCUTANEOUS

## 2015-08-22 NOTE — Progress Notes (Signed)
Inpatient Diabetes Program Recommendations  AACE/ADA: New Consensus Statement on Inpatient Glycemic Control (2015)  Target Ranges:  Prepandial:   less than 140 mg/dL      Peak postprandial:   less than 180 mg/dL (1-2 hours)      Critically ill patients:  140 - 180 mg/dL    Spoke with one of his 3 daughters, [name] who gave me some history of the event that led to this hospitalization. She states that [name], another sister is whom he lives with. She confirmed that patient does take his own insulin, stating that in the refrigerator, there is Novolog 70/30 mix- 18-22 units twice a day. She stated that there is also lantus in the refrigerator. She believes that the patient was on lantus prior, but does not take lantus presently. She states that her sister checks his blood sugars as well as the patient himself. She states that atient was to see Dr. [name] of [place] yesterday morning, and [name] checked his glucose which was then at 374 or 394 mg/dL. Patient gave himself 20 units of insulin-assume it was 70/30, but on the next 2 check the glucose read High times 2. [place] instructed that patient should just go to the ED. She was not aware of her brother having overly high glucose prior to this time. Hopefully can assess patient's ability to continue self-care with vigilance of his sister with whom he lives. Patient most likely will need more like 15-20 units basal insulin, as NPH portion of home insulin totals 28 units per day. Will follow and glad to assist if needed.  Thank you Rosita Kea, RN, MSN, CDE  Diabetes Inpatient Program Office: [phone] Pager: [phone] 8:00 am to 5:00 pm

## 2015-08-22 NOTE — Progress Notes (Signed)
Received report from [facility] from ICU. Pt arrived unit, alert to self, some confusion noted. MD notified of pt's location. Will continue with current plan of care.

## 2015-08-22 NOTE — Clinical Social Work Note (Signed)
Clinical Social Work Assessment  Patient Details  Name: [name] [name] MRN: [id number] Date of Birth: 1946-07-20  Date of referral:  08/22/15               Reason for consult:  Frequent Admissions / ED Visits                Permission sought to share information with:    Permission granted to share information::     Name::        Agency::     Relationship::     Contact Information:     Housing/Transportation Living arrangements for the past 2 months:  Single Family Home Source of Information:  Patient, Adult Children, Other (Comment Required) (sibling) Patient Interpreter Needed:  None Criminal Activity/Legal Involvement Pertinent to Current Situation/Hospitalization:  No - Comment as needed Significant Relationships:  Adult Children, Parents, Siblings Lives with:  Parents, Siblings Do you feel safe going back to the place where you live?  Yes Need for family participation in patient care:  Yes (Comment)  Care giving concerns:  Pt is non compliant with medications and diet.    Social Worker assessment / plan:  Pt was hospitalized on 08/21/15 with DKA. CSW was consulted due pt's frequent hospitalizations. PN reviewed. CSW spoke with pt / daughter Karna Christmas and sister [name]. Pt is alert and oriented to person and situation at this time.  Pt lives with his sisters Rico Ala, a brother and his 2 yr. old mother. Rico Ala, and their sister [name], provide care and supervision so pt is not left alone. Family reports pt is not compliant with medication and diet. Family is requesting assistance with medication management and PT. CSW has requested PT eval and RNCM for possible HH services.  Employment status:  Retired Nurse, adult PT Recommendations:  Not assessed at this time Information / Referral to community resources:     Patient/Family's Response to care:  Family is requesting [facility] services.   Patient/Family's Understanding of and Emotional Response to Diagnosis,  Current Treatment, and Prognosis:   Pt reports that he is feeling better and hopes to go home soon. Family reports that pt will not take his medication for them at home. [name] requested to have someone come to the house to administer meds. She feels pt will be more cooperative with non family members.  Emotional Assessment Appearance:  Appears stated age Attitude/Demeanor/Rapport:  Other (cooperative) Affect (typically observed):  Calm, Pleasant Orientation:  Oriented to Self (Pt is sometimes oriented to place / situation) Alcohol / Substance use:  Not Applicable Psych involvement (Current and /or in the community):  No (Comment)  Discharge Needs  Concerns to be addressed:  Discharge Planning Concerns Readmission within the last 30 days:  No Current discharge risk:  None Barriers to Discharge:  No Barriers Identified   Luretha Rued, [place] 08/22/2015, 4:10 PM

## 2015-08-22 NOTE — Progress Notes (Signed)
[facility] PROGRESS NOTE   [name] [name] [id number] DOB: November 20, 1945 DOA: 08/21/2015 PCP: Mauricio Po, [name]  HPI/Subjective: Patient is awake and alert, denies any complaints this morning. Seen with nursing staff at bedside, of the IV insulin.  Assessment/Plan: Active Problems:   CKD (chronic kidney disease) stage 3, GFR 30-59 ml/min   Acute encephalopathy   Uncontrolled type 2 diabetes mellitus with diabetic nephropathy, with long-term current use of insulin ([name])   Benign essential HTN   AKI (acute kidney injury) ([place])   DKA (diabetic ketoacidoses) ([name])    DKA (diabetic ketoacidoses)  Presented with glucose of 1028 and bicarbonate of 11, likely hyperosmolar nonketotic state versus DKA. Admitted stepdown started on IV insulin. BMP was checked every 4 hours, for the sodium, potassium and glucose. Hyperglycemia and acidosis resolved, bicarbonate is 21. Discontinue IV insulin. Started on 10 units of Lantus. Patient takes 22 units of 70/30 at home. This is likely secondary to noncompliance, question of dementia affecting his adherence to medications.  AKI on CKD 3 Baseline creatinine 1.6 about a week ago, presented with creatinine of 3.4. This is likely secondary to volume depletion from hyperosmolar hyperglycemic state Foley catheter placed, continue IV fluid hydration.  Hyperkalemia  Due to AKI and acidosis This is improved after initiation of IV insulin and resolution of the acidosis  Hyponatremia Due to pseudohyponatremia and volume loss, sodium is Q000111Q today  Metabolic encephalopathy Secondary to hyperosmolar hyperglycemic nonketotic state. This is improving, patient is awake, alert and oriented 3 today. Questionable dementia.  Benign essential HTN Stable monitor, resume amlodipine  History of CVA Continue aspirin, statin  Code Status: Full Code Family Communication: Plan discussed with the patient. Disposition Plan: Remains inpatient, will transfer  to regular bed today. Diet: Diet Carb Modified Fluid consistency:: Thin; Room service appropriate?: Yes  Consultants:  None  Procedures:  None  Antibiotics:  None   Objective: Filed Vitals:   08/22/15 0400  BP: 116/54  Pulse: 60  Temp: 98.3 F (36.8 C)  Resp: 11    Intake/Output Summary (Last 24 hours) at 08/22/15 0750 Last data filed at 08/22/15 0600  Gross per 24 hour  Intake 3272.15 ml  Output    875 ml  Net 2397.15 ml   Filed Weights   08/21/15 1700 08/22/15 0605  Weight: 63.3 kg (139 lb 8.8 oz) 65.5 kg (144 lb 6.4 oz)    Exam: General: Alert and awake, oriented x3, not in any acute distress. HEENT: anicteric sclera, pupils reactive to light and accommodation, EOMI CVS: S1-S2 clear, no murmur rubs or gallops Chest: clear to auscultation bilaterally, no wheezing, rales or rhonchi Abdomen: soft nontender, nondistended, normal bowel sounds, no organomegaly Extremities: no cyanosis, clubbing or edema noted bilaterally Neuro: Cranial nerves II-XII intact, no focal neurological deficits  Data Reviewed: Basic Metabolic Panel:  Recent Labs Lab 08/21/15 1335 08/21/15 1608 08/21/15 2040 08/22/15 0020 08/22/15 0335  NA 128* 131* 137 141 141  K 7.1* 7.1* 4.5 3.7 3.9  CL 91* 97* 106 110 111  CO2 11* 10* 11* 17* 21*  GLUCOSE 1028* 991* 435* 117* 125*  BUN 50* 49* 47* 45* 43*  CREATININE 3.39* 3.26* 3.28* 3.00* 2.96*  CALCIUM 8.8* 8.3* 8.7* 8.9 8.5*   Liver Function Tests:  Recent Labs Lab 08/21/15 1335  AST 30  ALT 29  ALKPHOS 115  BILITOT 1.6*  PROT 6.9  ALBUMIN 3.7    Recent Labs Lab 08/21/15 1335  LIPASE 27   No results for input(s): AMMONIA in  the last 168 hours. CBC:  Recent Labs Lab 08/21/15 1335 08/22/15 0335  WBC 13.0* 10.3  NEUTROABS 11.0*  --   HGB 9.7* 8.3*  HCT 32.0* 25.4*  MCV 100.0 91.7  PLT 245 209   Cardiac Enzymes: No results for input(s): CKTOTAL, CKMB, CKMBINDEX, TROPONINI in the last 168 hours. BNP (last 3  results) No results for input(s): BNP in the last 8760 hours.  ProBNP (last 3 results) No results for input(s): PROBNP in the last 8760 hours.  CBG:  Recent Labs Lab 08/22/15 0337 08/22/15 0424 08/22/15 0524 08/22/15 0631 08/22/15 0746  GLUCAP 116* 96 134* 143* 137*    Micro Recent Results (from the past 240 hour(s))  MRSA PCR Screening     Status: None   Collection Time: 08/21/15  4:58 PM  Result Value Ref Range Status   MRSA by PCR NEGATIVE NEGATIVE Final    Comment:        The GeneXpert MRSA Assay (FDA approved for NASAL specimens only), is one component of a comprehensive MRSA colonization surveillance program. It is not intended to diagnose MRSA infection nor to guide or monitor treatment for MRSA infections.      Studies: No results found.  Scheduled Meds: . amLODipine  10 mg Oral Daily  . aspirin EC  325 mg Oral Daily  . atorvastatin  10 mg Oral q1800  . enoxaparin (LOVENOX) injection  30 mg Subcutaneous Q24H  . FLUoxetine  20 mg Oral Daily  . insulin aspart  0-5 Units Subcutaneous QHS  . insulin aspart  0-9 Units Subcutaneous TID WC  . insulin glargine  10 Units Subcutaneous Daily   Continuous Infusions: . sodium chloride 1,000 mL (08/22/15 0519)       Time spent: 35 minutes    [facility] A  [facility] Pager [phone] If 7PM-7AM, please contact night-coverage at [email or web address], password [facility] 08/22/2015, 7:50 AM  LOS: 1 day

## 2015-08-22 NOTE — Care Management Note (Signed)
Case Management Note  Patient Details  Name: [name] [name] MRN: [id number] Date of Birth: 06/15/1946  Subjective/Objective:               dka     Action/Plan:Date: August 22, 2015 Chart reviewed for concurrent status and case management needs. Will continue to follow patient for changes and needs: [name], RN, BSN, [place]   [phone]  Expected Discharge Date:   (UNKNOWN)               Expected Discharge Plan:  Home/Self Care  In-House Referral:  NA  Discharge planning Services  CM Consult  Post Acute Care Choice:  NA Choice offered to:  NA  DME Arranged:    DME Agency:     HH Arranged:    HH Agency:     Status of Service:  In process, will continue to follow  Medicare Important Message Given:    Date Medicare IM Given:    Medicare IM give by:    Date Additional Medicare IM Given:    Additional Medicare Important Message give by:     If discussed at [place] of Stay Meetings, dates discussed:    Additional Comments:  Leeroy Cha, RN 08/22/2015, 1:00 PM

## 2015-08-22 NOTE — Progress Notes (Addendum)
Inpatient Diabetes Program Recommendations  AACE/ADA: New Consensus Statement on Inpatient Glycemic Control (2015)  Target Ranges:  Prepandial:   less than 140 mg/dL      Peak postprandial:   less than 180 mg/dL (1-2 hours)      Critically ill patients:  140 - 180 mg/dL   Review of Glycemic Control  Diabetes history: DM 2 Outpatient Diabetes medications: 70/30 20 units bid Current orders for Inpatient glycemic control: Lantus 10 units and sensitive correction tidwc and HS scale.  Inpatient Diabetes Program Recommendations:    Total daily dose of basal portion of home 70/30 (70% is 28 units and meal coverage total of 12 units) which if converted to novolog meal coverage of 4 units tidwc. However, it is not determined yet if patient will be able to eat. Thus 10 units lantus at this time is a good starting point, however he may need 15-20 to meet basal needs. Will follow. Would like to speak with his sister when available today. Ad-Spoke with patient who states he takes Humulin 70/30-17 units ac breakfast and supper. States he has no problem giving himself insulin. States he checks his own blood sugars as well at home. SIster may come in this afternoon. Thank you Rosita Kea, RN, MSN, CDE  Diabetes Inpatient Program Office: [phone] Pager: [phone] 8:00 am to 5:00 pm

## 2015-08-23 LAB — CBC
HCT: 25.3 % — ABNORMAL LOW (ref 39.0–52.0)
Hemoglobin: 8.4 g/dL — ABNORMAL LOW (ref 13.0–17.0)
MCH: 30.2 pg (ref 26.0–34.0)
MCHC: 33.2 g/dL (ref 30.0–36.0)
MCV: 91 fL (ref 78.0–100.0)
Platelets: 163 10*3/uL (ref 150–400)
RBC: 2.78 MIL/uL — ABNORMAL LOW (ref 4.22–5.81)
RDW: 14.7 % (ref 11.5–15.5)
WBC: 7.8 10*3/uL (ref 4.0–10.5)

## 2015-08-23 LAB — BASIC METABOLIC PANEL
Anion gap: 8 (ref 5–15)
BUN: 29 mg/dL — ABNORMAL HIGH (ref 6–20)
CO2: 23 mmol/L (ref 22–32)
Calcium: 8 mg/dL — ABNORMAL LOW (ref 8.9–10.3)
Chloride: 108 mmol/L (ref 101–111)
Creatinine, Ser: 1.88 mg/dL — ABNORMAL HIGH (ref 0.61–1.24)
GFR calc Af Amer: 40 mL/min — ABNORMAL LOW (ref >60)
GFR calc non Af Amer: 35 mL/min — ABNORMAL LOW (ref >60)
Glucose, Bld: 112 mg/dL — ABNORMAL HIGH (ref 65–99)
Potassium: 3.6 mmol/L (ref 3.5–5.1)
Sodium: 139 mmol/L (ref 135–145)

## 2015-08-23 LAB — GLUCOSE, CAPILLARY
Glucose-Capillary: 139 mg/dL — ABNORMAL HIGH (ref 65–99)
Glucose-Capillary: 195 mg/dL — ABNORMAL HIGH (ref 65–99)
Glucose-Capillary: 133 mg/dL — ABNORMAL HIGH (ref 65–99)
Glucose-Capillary: 137 mg/dL — ABNORMAL HIGH (ref 65–99)

## 2015-08-23 MED ORDER — POTASSIUM CHLORIDE CRYS ER 20 MEQ PO TBCR
40.0000 meq | EXTENDED_RELEASE_TABLET | Freq: Once | ORAL | Status: AC
  Administered 2015-08-23: 40 meq via ORAL
  Filled 2015-08-23: qty 2

## 2015-08-23 MED ORDER — INSULIN ASPART 100 UNIT/ML ~~LOC~~ SOLN
3.0000 [IU] | Freq: Three times a day (TID) | SUBCUTANEOUS | Status: DC
  Administered 2015-08-23 – 2015-08-24 (×3): 3 [IU] via SUBCUTANEOUS

## 2015-08-23 NOTE — Evaluation (Signed)
Physical Therapy Evaluation Patient Details Name: [name] [name] MRN: [id number] DOB: 1946-06-25 Today's Date: 08/23/2015   History of Present Illness  [name] [name] is a 69 y.o. male with uncontrolled DM, CKD3, ? Memory/cognitive problems, h/o CVA, anemia of chronic disease, presents to the ER with glucose  > 1000, AMS.  Clinical Impression  *Pt admitted with above diagnosis. Pt currently with functional limitations due to the deficits listed below (see PT Problem List).  Pt will benefit from skilled PT to increase their independence and safety with mobility to allow discharge to the venue listed below.   Patient was recently discharged. Patient does present with decreased balance and scores  For moderate fall risk.     Follow Up Recommendations SNF;Supervision/Assistance - 24 hour    Equipment Recommendations  Cane    Recommendations for Other Services       Precautions / Restrictions Precautions Precautions: Fall Precaution Comments: L rib fx      Mobility  Bed Mobility Overal bed mobility: Independent                Transfers Overall transfer level: Needs assistance Equipment used: None Transfers: Sit to/from Stand Sit to Stand: Supervision         General transfer comment: no assist to stand from the bed , toilet and recliner, 2, wide base,   Ambulation/Gait Ambulation/Gait assistance: Min guard Ambulation Distance (Feet): 180 Feet Assistive device: None Gait Pattern/deviations: Drifts right/left;Step-through pattern   Gait velocity interpretation: Below normal speed for age/gender General Gait Details: noted ankle strategies in constant  use when walking, uses rail at times.  Stairs            Wheelchair Mobility    Modified Rankin (Stroke Patients Only)       Balance                                 Standardized Balance Assessment Standardized Balance Assessment : Berg Balance Test Berg Balance Test Sit to Stand: Able to stand  without using hands and stabilize independently Standing Unsupported: Able to stand safely 2 minutes Sitting with Back Unsupported but Feet Supported on Floor or Stool: Able to sit safely and securely 2 minutes Stand to Sit: Sits safely with minimal use of hands Transfers: Able to transfer safely, minor use of hands Standing Unsupported with Eyes Closed: Able to stand 10 seconds with supervision Standing Ubsupported with Feet Together: Needs help to attain position but able to stand for 30 seconds with feet together From Standing, Reach Forward with Outstretched Arm: Can reach confidently >25 cm (10") From Standing Position, Pick up Object from Floor: Able to pick up shoe, needs supervision From Standing Position, Turn to Look Behind Over each Shoulder: Needs assist to keep from losing balance and falling Turn 360 Degrees: Needs close supervision or verbal cueing Standing Unsupported, Alternately Place Feet on Step/Stool: Needs assistance to keep from falling or unable to try Standing Unsupported, One Foot in Front: Loses balance while stepping or standing Standing on One Leg: Unable to try or needs assist to prevent fall Total Score: 32         Pertinent Vitals/Pain Pain Assessment: No/denies pain    Home Living Family/patient expects to be discharged to:: Private residence   Available Help at Discharge: Family;Available 24 hours/day Type of Home: House Home Access: Level entry     Home Layout: One level Home Equipment: None  Prior Function Level of Independence: Independent         Comments: Does not drive     Hand Dominance   Dominant Hand: Right    Extremity/Trunk Assessment   Upper Extremity Assessment: Generalized weakness           Lower Extremity Assessment: Generalized weakness      Cervical / Trunk Assessment: Normal  Communication   Communication: No difficulties  Cognition Arousal/Alertness: Awake/alert   Overall Cognitive Status:  Within Functional Limits for tasks assessed                      General Comments General comments (skin integrity, edema, etc.): patient scores with a medium fall risk.    Exercises        Assessment/Plan    PT Assessment Patient needs continued PT services  PT Diagnosis Abnormality of gait;Generalized weakness   PT Problem List    PT Treatment Interventions DME instruction;Gait training;Functional mobility training;Therapeutic activities;Patient/family education;Balance training   PT Goals (Current goals can be found in the Care Plan section) Acute Rehab PT Goals Patient Stated Goal: to go home PT Goal Formulation: With patient Time For Goal Achievement: 09/06/15 Potential to Achieve Goals: Good    Frequency Min 3X/week   Barriers to discharge        Co-evaluation               End of Session   Activity Tolerance: Patient tolerated treatment well Patient left: in chair;with call bell/phone within reach;with chair alarm set Nurse Communication: Mobility status         Time: [id number] PT Time Calculation (min) (ACUTE ONLY): 33 min   Charges:   PT Evaluation $Initial PT Evaluation Tier I: 1 Procedure PT Treatments $Gait Training: 8-22 mins   PT G Codes:        Claretha Cooper 08/23/2015, 9:59 AM Tresa Endo PT [phone]

## 2015-08-23 NOTE — Progress Notes (Signed)
[facility] PROGRESS NOTE   [name] [name] [id number] DOB: 10-02-1946 DOA: 08/21/2015 PCP: Mauricio Po, [name]  HPI/Subjective:  feels better today, without any new complaints.   by PT/OT recommendations, social work to evaluate.  Assessment/Plan: Active Problems:   CKD (chronic kidney disease) stage 3, GFR 30-59 ml/min   Acute encephalopathy   Uncontrolled type 2 diabetes mellitus with diabetic nephropathy, with long-term current use of insulin ([name])   Benign essential HTN   AKI (acute kidney injury) ([place])   DKA (diabetic ketoacidoses) ([name])   DKA (diabetic ketoacidoses)  Presented with glucose of 1028 and bicarbonate of 11, likely hyperosmolar nonketotic state versus DKA. Admitted stepdown started on IV insulin. BMP was checked every 4 hours, for the sodium, potassium and glucose. Hyperglycemia and acidosis resolved, bicarbonate is 21. Discontinue IV insulin. Started on 10 units of Lantus. Patient takes 22 units of 70/30 at home. This is likely secondary to noncompliance, question of dementia affecting his adherence to medications. Per PT evaluation he will need SNF,  Social work to evaluate.  AKI on CKD 3 Baseline creatinine 1.6 about a week ago, presented with creatinine of 3.4. This is likely secondary to volume depletion from hyperosmolar hyperglycemic state Discontinue Foley catheter, continue to monitor urine output , continue IV fluids , check BMP in a.m.  Hyperkalemia  Due to AKI and acidosis This is improved after initiation of IV insulin and resolution of the acidosis  Hyponatremia Due to pseudohyponatremia and volume loss, sodium is Q000111Q today  Metabolic encephalopathy Secondary to hyperosmolar hyperglycemic nonketotic state. This is improving, patient is awake, alert and oriented 3 today. Questionable dementia.  Benign essential HTN Stable monitor, resume amlodipine  History of CVA Continue aspirin, statin  Code Status: Full Code Family  Communication: Plan discussed with the patient. Disposition Plan: Remains inpatient, will transfer to regular bed today. Diet: Diet Carb Modified Fluid consistency:: Thin; Room service appropriate?: Yes  Consultants:  None  Procedures:  None  Antibiotics:  None   Objective: Filed Vitals:   08/23/15 0509  BP: 144/75  Pulse: 63  Temp: 99.2 F (37.3 C)  Resp: 20    Intake/Output Summary (Last 24 hours) at 08/23/15 1312 Last data filed at 08/23/15 0900  Gross per 24 hour  Intake   2160 ml  Output   1775 ml  Net    385 ml   Filed Weights   08/21/15 1700 08/22/15 0605  Weight: 63.3 kg (139 lb 8.8 oz) 65.5 kg (144 lb 6.4 oz)    Exam: General: Alert and awake, oriented x3, not in any acute distress. HEENT: anicteric sclera, pupils reactive to light and accommodation, EOMI CVS: S1-S2 clear, no murmur rubs or gallops Chest: clear to auscultation bilaterally, no wheezing, rales or rhonchi Abdomen: soft nontender, nondistended, normal bowel sounds, no organomegaly Extremities: no cyanosis, clubbing or edema noted bilaterally Neuro: Cranial nerves II-XII intact, no focal neurological deficits  Data Reviewed: Basic Metabolic Panel:  Recent Labs Lab 08/22/15 0020 08/22/15 0335 08/22/15 0820 08/22/15 1220 08/23/15 0557  NA 141 141 140 139 139  K 3.7 3.9 3.8 4.1 3.6  CL 110 111 109 108 108  CO2 17* 21* 21* 17* 23  GLUCOSE 117* 125* 135* 202* 112*  BUN 45* 43* 43* 42* 29*  CREATININE 3.00* 2.96* 2.74* 2.75* 1.88*  CALCIUM 8.9 8.5* 8.7* 8.5* 8.0*   Liver Function Tests:  Recent Labs Lab 08/21/15 1335  AST 30  ALT 29  ALKPHOS 115  BILITOT 1.6*  PROT 6.9  ALBUMIN 3.7    Recent Labs Lab 08/21/15 1335  LIPASE 27   No results for input(s): AMMONIA in the last 168 hours. CBC:  Recent Labs Lab 08/21/15 1335 08/22/15 0335 08/23/15 0557  WBC 13.0* 10.3 7.8  NEUTROABS 11.0*  --   --   HGB 9.7* 8.3* 8.4*  HCT 32.0* 25.4* 25.3*  MCV 100.0 91.7 91.0    PLT 245 209 163   Cardiac Enzymes: No results for input(s): CKTOTAL, CKMB, CKMBINDEX, TROPONINI in the last 168 hours. BNP (last 3 results) No results for input(s): BNP in the last 8760 hours.  ProBNP (last 3 results) No results for input(s): PROBNP in the last 8760 hours.  CBG:  Recent Labs Lab 08/22/15 1158 08/22/15 1829 08/22/15 2133 08/23/15 0719 08/23/15 1155  GLUCAP 220* 122* 73 139* 195*    Micro Recent Results (from the past 240 hour(s))  MRSA PCR Screening     Status: None   Collection Time: 08/21/15  4:58 PM  Result Value Ref Range Status   MRSA by PCR NEGATIVE NEGATIVE Final    Comment:        The GeneXpert MRSA Assay (FDA approved for NASAL specimens only), is one component of a comprehensive MRSA colonization surveillance program. It is not intended to diagnose MRSA infection nor to guide or monitor treatment for MRSA infections.      Studies: No results found.  Scheduled Meds: . amLODipine  10 mg Oral Daily  . aspirin EC  325 mg Oral Daily  . atorvastatin  10 mg Oral q1800  . enoxaparin (LOVENOX) injection  30 mg Subcutaneous Q24H  . FLUoxetine  20 mg Oral Daily  . insulin aspart  0-9 Units Subcutaneous TID WC  . insulin aspart  3 Units Subcutaneous TID WC  . insulin glargine  10 Units Subcutaneous Daily   Continuous Infusions: . sodium chloride 1,000 mL (08/23/15 1227)       Time spent: 35 minutes    [facility] A  [facility] Pager [phone] If 7PM-7AM, please contact night-coverage at [email or web address], password [facility] 08/23/2015, 1:12 PM  LOS: 2 days

## 2015-08-23 NOTE — NC FL2 (Deleted)
[place] LEVEL OF CARE SCREENING TOOL     IDENTIFICATION  Patient Name: [name] [name] Birthdate: 07/13/1946 Sex: male Admission Date (Current Location): 08/21/2015  [facility] and [place] Number: Herbalist and Address:  [facility],  [place] [address] [place]      Provider Number: [id number]  Attending Physician Name and Address:  Verlee Monte, MD  Relative Name and Phone Number:       Current Level of Care: Hospital Recommended Level of Care: [employer] Prior Approval Number:    Date Approved/Denied:   PASRR Number:    Discharge Plan: SNF    Current Diagnoses: Patient Active Problem List   Diagnosis Date Noted  . AKI (acute kidney injury) ([place]) 08/21/2015  . DKA (diabetic ketoacidoses) ([place]) 08/21/2015  . Uncontrolled type 2 diabetes mellitus with diabetic nephropathy, with long-term current use of insulin ([place]) 08/12/2015  . Leukocytosis 08/12/2015  . Benign essential HTN 08/12/2015  . Dyslipidemia associated with type 2 diabetes mellitus ([place]) 08/12/2015  . Depression 08/12/2015  . 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 Acute encephalopathy   . CKD (chronic kidney disease) stage 3, GFR 30-59 ml/min 03/20/2014  . Diabetic hyperosmolar non-ketotic state ([place]) 07/04/2013  . Anemia of chronic disease 02/02/2013    Orientation ACTIVITIES/SOCIAL BLADDER RESPIRATION    Self, Time, Situation, Place  Active Continent    BEHAVIORAL SYMPTOMS/MOOD NEUROLOGICAL BOWEL NUTRITION STATUS      Continent Diet (carb modified)  PHYSICIAN VISITS COMMUNICATION OF NEEDS Height & Weight Skin    Verbally _0  (175.3 cm) 144 lbs.            AMBULATORY STATUS RESPIRATION             Personal Care Assistance Level of Assistance  Bathing, Dressing Bathing Assistance: Limited assistance   Dressing Assistance: Limited assistance      Functional Limitations Info                 SPECIAL CARE FACTORS FREQUENCY  PT (By licensed PT), OT (By licensed OT)     PT Frequency: 5 OT Frequency: 5           Additional Factors Info  Allergies   Allergies Info: NKDA           Current Medications (08/23/2015): Current Facility-Administered Medications  Medication Dose Route Frequency Provider Last Rate Last Dose  . 0.9 %  sodium chloride infusion  1,000 mL Intravenous Continuous Gardiner Barefoot, NP 75 mL/hr at 08/23/15 1227 1,000 mL at 08/23/15 1227  . acetaminophen (TYLENOL) tablet 650 mg  650 mg Oral Q6H PRN Domenic Polite, MD      . ALPRAZolam [name]) tablet 0.5 mg  0.5 mg Oral BID PRN Domenic Polite, MD      . amLODipine (NORVASC) tablet 10 mg  10 mg Oral Daily Domenic Polite, MD   10 mg at 08/23/15 1022  . aspirin EC tablet 325 mg  325 mg Oral Daily Domenic Polite, MD   325 mg at 08/23/15 1023  . atorvastatin (LIPITOR) tablet 10 mg  10 mg Oral q1800 Domenic Polite, MD   10 mg at 08/22/15 2125  . dextrose 50 % solution 25 mL  25 mL Intravenous PRN Domenic Polite, MD      . enoxaparin (LOVENOX) injection 30 mg  30 mg Subcutaneous Q24H Domenic Polite, MD   30 mg at 08/22/15 2125  . FLUoxetine (PROZAC)  capsule 20 mg  20 mg Oral Daily Domenic Polite, MD   20 mg at 08/23/15 1023  . insulin aspart (novoLOG) injection 0-9 Units  0-9 Units Subcutaneous TID WC Gardiner Barefoot, NP   2 Units at 08/23/15 1227  . insulin aspart (novoLOG) injection 3 Units  3 Units Subcutaneous TID WC Mutaz Elmahi, MD      . insulin glargine (LANTUS) injection 10 Units  10 Units Subcutaneous Daily Gardiner Barefoot, NP   10 Units at 08/23/15 1022  . ondansetron (ZOFRAN) tablet 4 mg  4 mg Oral Q6H PRN Domenic Polite, MD       Or  . ondansetron [facility]) injection 4 mg  4 mg Intravenous Q6H PRN Domenic Polite, MD      . oxyCODONE (Oxy IR/ROXICODONE) immediate release tablet 20 mg  20 mg Oral BID PRN Domenic Polite, MD   20 mg at 08/23/15 0454  . potassium chloride SA  (K-DUR,KLOR-CON) CR tablet 40 mEq  40 mEq Oral Once Verlee Monte, MD       Do not use this list as official medication orders. Please verify with discharge summary.  Discharge Medications:   Medication List    ASK your doctor about these medications        acetaminophen 325 MG tablet  Commonly known as:  TYLENOL  Take 2 tablets (650 mg total) by mouth every 6 (six) hours as needed for mild pain (or Fever >/= 101).     alprazolam 2 MG tablet  Commonly known as:  XANAX  Take 1 mg by mouth 2 (two) times daily as needed for anxiety.     amLODipine 10 MG tablet  Commonly known as:  NORVASC  Take 1 tablet (10 mg total) by mouth daily.     aspirin EC 325 MG tablet  Take 1 tablet (325 mg total) by mouth daily.     atorvastatin 10 MG tablet  Commonly known as:  LIPITOR  Take 1 tablet (10 mg total) by mouth daily at 6 PM.     blood glucose meter kit and supplies  Dispense based on patient and insurance preference. Use up to four times daily as directed. (FOR ICD-9 250.00, 250.01).     feeding supplement (GLUCERNA SHAKE) Liqd  Take 237 mLs by mouth 2 (two) times daily between meals.     FLUoxetine 20 MG capsule  Commonly known as:  PROZAC  Take 20 mg by mouth daily.     folic acid 1 MG tablet  Commonly known as:  FOLVITE  Take 1 mg by mouth daily.     insulin aspart protamine- aspart (70-30) 100 UNIT/ML injection  Commonly known as:  NOVOLOG MIX 70/30  Inject 0.1 mLs (10 Units total) into the skin 2 (two) times daily with a meal.     Oxycodone HCl 20 MG Tabs  Take 20 mg by mouth 2 (two) times daily as needed (pain).     PRESCRIPTION MEDICATION  Place 1 Dose into the right eye every 30 (thirty) days. Eye injection        Relevant Imaging Results:  Relevant Lab Results:  Recent Labs    Additional Information SSN [id number]  Ludwig Clarks, LCSW

## 2015-08-23 NOTE — NC FL2 (Signed)
[place] LEVEL OF CARE SCREENING TOOL     IDENTIFICATION  Patient Name: [name] [name] Birthdate: 1945/12/22 Sex: male Admission Date (Current Location): 08/21/2015  [facility] and [place] Number: Herbalist and Address:  [facility],  [place] [address] [place]      Provider Number: [id number]  Attending Physician Name and Address:  Verlee Monte, MD  Relative Name and Phone Number:       Current Level of Care: Hospital Recommended Level of Care: [place] Prior Approval Number:    Date Approved/Denied:   PASRR Number:   [id number]   Discharge Plan: SNF    Current Diagnoses: Patient Active Problem List   Diagnosis Date Noted  . AKI (acute kidney injury) ([place]) 08/21/2015  . DKA (diabetic ketoacidoses) ([place]) 08/21/2015  . Uncontrolled type 2 diabetes mellitus with diabetic nephropathy, with long-term current use of insulin ([place]) 08/12/2015  . Leukocytosis 08/12/2015  . Benign essential HTN 08/12/2015  . Dyslipidemia associated with type 2 diabetes mellitus ([place]) 08/12/2015  . Depression 08/12/2015  . 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 Acute encephalopathy   . CKD (chronic kidney disease) stage 3, GFR 30-59 ml/min 03/20/2014  . Diabetic hyperosmolar non-ketotic state ([place]) 07/04/2013  . Anemia of chronic disease 02/02/2013    Orientation ACTIVITIES/SOCIAL BLADDER RESPIRATION    Self, Time, Situation, Place  Active Continent    BEHAVIORAL SYMPTOMS/MOOD NEUROLOGICAL BOWEL NUTRITION STATUS      Continent Diet (carb modified)  PHYSICIAN VISITS COMMUNICATION OF NEEDS Height & Weight Skin    Verbally '5\' 9"'  (175.3 cm) 144 lbs.            AMBULATORY STATUS RESPIRATION             Personal Care Assistance Level of Assistance  Bathing, Dressing Bathing Assistance: Limited assistance   Dressing Assistance: Limited assistance      Functional  Limitations Info                SPECIAL CARE FACTORS FREQUENCY  PT (By licensed PT), OT (By licensed OT)     PT Frequency: 5 OT Frequency: 5           Additional Factors Info  Allergies   Allergies Info: NKDA           Current Medications (08/23/2015): Current Facility-Administered Medications  Medication Dose Route Frequency Provider Last Rate Last Dose  . 0.9 %  sodium chloride infusion  1,000 mL Intravenous Continuous Gardiner Barefoot, NP 75 mL/hr at 08/23/15 1227 1,000 mL at 08/23/15 1227  . acetaminophen (TYLENOL) tablet 650 mg  650 mg Oral Q6H PRN Domenic Polite, MD      . ALPRAZolam [name]) tablet 0.5 mg  0.5 mg Oral BID PRN Domenic Polite, MD      . amLODipine (NORVASC) tablet 10 mg  10 mg Oral Daily Domenic Polite, MD   10 mg at 08/23/15 1022  . aspirin EC tablet 325 mg  325 mg Oral Daily Domenic Polite, MD   325 mg at 08/23/15 1023  . atorvastatin (LIPITOR) tablet 10 mg  10 mg Oral q1800 Domenic Polite, MD   10 mg at 08/22/15 2125  . dextrose 50 % solution 25 mL  25 mL Intravenous PRN Domenic Polite, MD      . enoxaparin (LOVENOX) injection 30 mg  30 mg Subcutaneous Q24H Domenic Polite, MD   30 mg at 08/22/15 2125  .  FLUoxetine (PROZAC) capsule 20 mg  20 mg Oral Daily Domenic Polite, MD   20 mg at 08/23/15 1023  . insulin aspart (novoLOG) injection 0-9 Units  0-9 Units Subcutaneous TID WC Gardiner Barefoot, NP   2 Units at 08/23/15 1227  . insulin aspart (novoLOG) injection 3 Units  3 Units Subcutaneous TID WC Mutaz Elmahi, MD      . insulin glargine (LANTUS) injection 10 Units  10 Units Subcutaneous Daily Gardiner Barefoot, NP   10 Units at 08/23/15 1022  . ondansetron (ZOFRAN) tablet 4 mg  4 mg Oral Q6H PRN Domenic Polite, MD       Or  . ondansetron [facility]) injection 4 mg  4 mg Intravenous Q6H PRN Domenic Polite, MD      . oxyCODONE (Oxy IR/ROXICODONE) immediate release tablet 20 mg  20 mg Oral BID PRN Domenic Polite, MD   20 mg at 08/23/15 0454  .  potassium chloride SA (K-DUR,KLOR-CON) CR tablet 40 mEq  40 mEq Oral Once Verlee Monte, MD       Do not use this list as official medication orders. Please verify with discharge summary.  Discharge Medications:   Medication List    ASK your doctor about these medications        acetaminophen 325 MG tablet  Commonly known as:  TYLENOL  Take 2 tablets (650 mg total) by mouth every 6 (six) hours as needed for mild pain (or Fever >/= 101).     alprazolam 2 MG tablet  Commonly known as:  XANAX  Take 1 mg by mouth 2 (two) times daily as needed for anxiety.     amLODipine 10 MG tablet  Commonly known as:  NORVASC  Take 1 tablet (10 mg total) by mouth daily.     aspirin EC 325 MG tablet  Take 1 tablet (325 mg total) by mouth daily.     atorvastatin 10 MG tablet  Commonly known as:  LIPITOR  Take 1 tablet (10 mg total) by mouth daily at 6 PM.     blood glucose meter kit and supplies  Dispense based on patient and insurance preference. Use up to four times daily as directed. (FOR ICD-9 250.00, 250.01).     feeding supplement (GLUCERNA SHAKE) Liqd  Take 237 mLs by mouth 2 (two) times daily between meals.     FLUoxetine 20 MG capsule  Commonly known as:  PROZAC  Take 20 mg by mouth daily.     folic acid 1 MG tablet  Commonly known as:  FOLVITE  Take 1 mg by mouth daily.     insulin aspart protamine- aspart (70-30) 100 UNIT/ML injection  Commonly known as:  NOVOLOG MIX 70/30  Inject 0.1 mLs (10 Units total) into the skin 2 (two) times daily with a meal.     Oxycodone HCl 20 MG Tabs  Take 20 mg by mouth 2 (two) times daily as needed (pain).     PRESCRIPTION MEDICATION  Place 1 Dose into the right eye every 30 (thirty) days. Eye injection        Relevant Imaging Results:  Relevant Lab Results:  Recent Labs    Additional Information SSN [id number]  Ludwig Clarks, LCSW

## 2015-08-24 LAB — BASIC METABOLIC PANEL
Anion gap: 6 (ref 5–15)
BUN: 18 mg/dL (ref 6–20)
CO2: 23 mmol/L (ref 22–32)
Calcium: 7.9 mg/dL — ABNORMAL LOW (ref 8.9–10.3)
Chloride: 108 mmol/L (ref 101–111)
Creatinine, Ser: 1.28 mg/dL — ABNORMAL HIGH (ref 0.61–1.24)
GFR calc Af Amer: >60 mL/min (ref >60)
GFR calc non Af Amer: 55 mL/min — ABNORMAL LOW (ref >60)
Glucose, Bld: 131 mg/dL — ABNORMAL HIGH (ref 65–99)
Potassium: 3.3 mmol/L — ABNORMAL LOW (ref 3.5–5.1)
Sodium: 137 mmol/L (ref 135–145)

## 2015-08-24 LAB — GLUCOSE, CAPILLARY
Glucose-Capillary: 149 mg/dL — ABNORMAL HIGH (ref 65–99)
Glucose-Capillary: 90 mg/dL (ref 65–99)
Glucose-Capillary: 149 mg/dL — ABNORMAL HIGH (ref 65–99)

## 2015-08-24 MED ORDER — INSULIN ASPART 100 UNIT/ML ~~LOC~~ SOLN: 3.0000 [IU] | Freq: Three times a day (TID) | SUBCUTANEOUS | Status: DC

## 2015-08-24 MED ORDER — INSULIN GLARGINE 100 UNIT/ML ~~LOC~~ SOLN: 10.0000 [IU] | Freq: Every day | SUBCUTANEOUS | Status: DC

## 2015-08-24 MED ORDER — ALPRAZOLAM 2 MG PO TABS: 1.0000 mg | ORAL_TABLET | Freq: Two times a day (BID) | ORAL | Status: DC | PRN

## 2015-08-24 MED ORDER — ENOXAPARIN SODIUM 40 MG/0.4ML ~~LOC~~ SOLN
40.0000 mg | SUBCUTANEOUS | Status: DC
  Filled 2015-08-24: qty 0.4

## 2015-08-24 NOTE — Clinical Social Work Note (Signed)
CSW extended bed offers to pt's sister per his request.  Family selected [facility].  CSW informed facility and requested that they begin the authorization process.  CSW will continue to follow and assist with discharge needs.  [place], [place]

## 2015-08-24 NOTE — Progress Notes (Signed)
SNF bed confirmed for patient at [facility]. He and family are agreeable to this plan- will plan transfer via EMS today.     Eduard Clos, MSW, [place]

## 2015-08-24 NOTE — Discharge Summary (Signed)
Physician Discharge Summary  [name] [name] EVO:[id number] DOB: August 29, 1946 DOA: 08/21/2015  PCP: Mauricio Po, [name]  Admit date: 08/21/2015 Discharge date: 08/24/2015  Time spent: 40 minutes  Recommendations for Outpatient Follow-up:  1. Follow up with nursing home MD  Discharge Diagnoses:  Active Problems:   CKD (chronic kidney disease) stage 3, GFR 30-59 ml/min   Acute encephalopathy   Uncontrolled type 2 diabetes mellitus with diabetic nephropathy, with long-term current use of insulin ([name])   Benign essential HTN   AKI (acute kidney injury) ([place])   DKA (diabetic ketoacidoses) ([place])   Discharge Condition: Stable  Diet recommendation: Carbohydrate modified diet  Filed Weights   08/21/15 1700 08/22/15 0605  Weight: 63.3 kg (139 lb 8.8 oz) 65.5 kg (144 lb 6.4 oz)    History of present illness:  [name] [name] is a 69 y.o. male with uncontrolled DM, CKD3, ? Memory/cognitive problems, h/o CVA, anemia of chronic disease, presents to the ER with the above complaints. Patient was just discharged from [facility] 1 week ago after treatment for diabetic hyperosmolar nonketotic state, his insulin was adjusted at the time. His hemoglobin A1c was 13.4 Patient is confused and unable to provide any meaningful history. I called his sister [name] reports that He's been living with her since discharge last time, she thinks he may have some memory problems and this worsens in the setting of uncontrolled blood sugars. His sister for a tells me that last night his blood sugars were in the 500s, one of his sisters administered insulin and subsequently his blood sugars improved. They're not sure whether he took any insulin this morning. This morning he appeared more confused, was dizzy, and his blood sugar read as too high Patient denies any fevers or chills. No cough congestion shortness of breath nausea vomiting abdominal pain diarrhea dysuria etc. In the emergency room he was noted to have blood  glucose of 1028, sodium of 128, potassium 7.1, bicarbonate of 11, WBC of 13 K  Hospital Course:   Hyperosmolar nonketotic state  Presented with glucose of 1028 and bicarbonate of 11, likely hyperosmolar nonketotic state. Admitted stepdown started on IV insulin. BMP was checked every 4 hours, for the sodium, potassium and glucose. Hyperglycemia and acidosis resolved, IV insulin discontinued. Started on 10 units of Lantus. Patient takes 22 units of 70/30 at home, discontinued. This is likely secondary to noncompliance, question of dementia affecting his adherence to medications. Per PT evaluation he will need SNF. Discharge on 10 units of Lantus daily and 3 units of NovoLog with meals.  AKI on CKD 3 Baseline creatinine 1.6 about a week ago, presented with creatinine of 3.4. This is likely secondary to volume depletion from hyperosmolar hyperglycemic state Creatinine at discharge is 1.28.  Hyperkalemia/hypokalemia  Due to AKI and acidosis This is improved after initiation of IV insulin and resolution of the acidosis. Patient developed hypokalemia on the day of discharge, repleted with oral supplements.  Hyponatremia Due to pseudohyponatremia and volume loss, sodium is 299 today  Metabolic encephalopathy Secondary to hyperosmolar hyperglycemic nonketotic state. Patient was sleepy and more confused than usual on admission. This is resolved, patient is awake, alert and oriented 3 today. Questionable dementia.  Benign essential HTN Stable monitor, resume amlodipine  History of CVA Continue aspirin, statin, listed that he has pain and he was using oxycodone. Apparently he was not using oxycodone that much, discontinued on discharge.  Dementia Patient listed as having mild dementia, patient has 6 admissions in the past 6 months  to the hospital. Postoperative admissions secondary to hyperosmolar hyperglycemic nonketotic state, and the cause is not adherence to medications. Patient  somehow does not take his insulin at home when he comes to the hospital with very high blood sugar and dehydration. It appears that he stays with his mother and sister (his sister works) so no one checks on him if he is taking his medications or not.  Procedures:  None  Consultations:  None  Discharge Exam: Filed Vitals:   08/24/15 0433  BP: 153/76  Pulse: 65  Temp: 99.8 F (37.7 C)  Resp: 16   General: Alert and awake, oriented x3, not in any acute distress. HEENT: anicteric sclera, pupils reactive to light and accommodation, EOMI CVS: S1-S2 clear, no murmur rubs or gallops Chest: clear to auscultation bilaterally, no wheezing, rales or rhonchi Abdomen: soft nontender, nondistended, normal bowel sounds, no organomegaly Extremities: no cyanosis, clubbing or edema noted bilaterally Neuro: Cranial nerves II-XII intact, no focal neurological deficits  Discharge Instructions   Discharge Instructions    Diet Carb Modified    Complete by:  As directed      Increase activity slowly    Complete by:  As directed           Current Discharge Medication List    START taking these medications   Details  insulin aspart (NOVOLOG) 100 UNIT/ML injection Inject 3 Units into the skin 3 (three) times daily with meals.    insulin glargine (LANTUS) 100 UNIT/ML injection Inject 0.1 mLs (10 Units total) into the skin daily.      CONTINUE these medications which have CHANGED   Details  alprazolam (XANAX) 2 MG tablet Take 0.5 tablets (1 mg total) by mouth 2 (two) times daily as needed for anxiety. Qty: 10 tablet, Refills: 0      CONTINUE these medications which have NOT CHANGED   Details  acetaminophen (TYLENOL) 325 MG tablet Take 2 tablets (650 mg total) by mouth every 6 (six) hours as needed for mild pain (or Fever >/= 101). Qty: 30 tablet, Refills: 0    amLODipine (NORVASC) 10 MG tablet Take 1 tablet (10 mg total) by mouth daily. Qty: 30 tablet, Refills: 0    aspirin EC 325 MG  tablet Take 1 tablet (325 mg total) by mouth daily. Qty: 90 tablet, Refills: 3   Associated Diagnoses: Stroke ([place])    atorvastatin (LIPITOR) 10 MG tablet Take 1 tablet (10 mg total) by mouth daily at 6 PM. Qty: 30 tablet, Refills: 0    blood glucose meter kit and supplies Dispense based on patient and insurance preference. Use up to four times daily as directed. (FOR ICD-9 250.00, 250.01). Qty: 1 each, Refills: 0    FLUoxetine (PROZAC) 20 MG capsule Take 20 mg by mouth daily.    folic acid (FOLVITE) 1 MG tablet Take 1 mg by mouth daily.    PRESCRIPTION MEDICATION Place 1 Dose into the right eye every 30 (thirty) days. Eye injection    feeding supplement, GLUCERNA SHAKE, (GLUCERNA SHAKE) LIQD Take 237 mLs by mouth 2 (two) times daily between meals. Qty: 237 mL, Refills: 0      STOP taking these medications     insulin aspart protamine- aspart (NOVOLOG MIX 70/30) (70-30) 100 UNIT/ML injection      Oxycodone HCl 20 MG TABS        No Known Allergies Follow-up Information    Follow up with Mauricio Po, [name] In 1 week.   Specialty:  Family  Medicine   Contact information:   [address] [place] [id number] [phone]        The results of significant diagnostics from this hospitalization (including imaging, microbiology, ancillary and laboratory) are listed below for reference.    Significant Diagnostic Studies: Dg Ribs Unilateral W/chest Left  08/07/2015  CLINICAL DATA:  Fall, mid axillary rib pain as marked. EXAM: LEFT RIBS AND CHEST - 3+ VIEW COMPARISON:  Chest x-ray dated 07/05/2015. FINDINGS: Single view of the chest and four views of the left ribs are provided. There is a slightly displaced fracture of the left lateral fifth rib. No other rib fracture seen. Cardiomediastinal silhouette remains normal in size and configuration. Lungs are clear. No pleural effusion seen. No pneumothorax seen. Lung volumes are normal. IMPRESSION: Slightly displaced fracture of the  left lateral fifth rib. Electronically Signed   By: Franki Cabot M.D.   On: 08/07/2015 15:14   Ct Head Wo Contrast  08/07/2015  CLINICAL DATA:  Patient fell tonight at home while going to the bathroom. Complaining of left-sided chest wall pain. EXAM: CT HEAD WITHOUT CONTRAST CT CERVICAL SPINE WITHOUT CONTRAST TECHNIQUE: Multidetector CT imaging of the head and cervical spine was performed following the standard protocol without intravenous contrast. Multiplanar CT image reconstructions of the cervical spine were also generated. COMPARISON:  07/02/2015 FINDINGS: CT HEAD FINDINGS Ventricles are normal in size and configuration. There are no parenchymal masses or mass effect. There is no evidence of a cortical infarct. Mild periventricular white matter hypoattenuation is noted consistent with chronic microvascular ischemic change. There are no extra-axial masses or abnormal fluid collections. There is no intracranial hemorrhage. Small amount of dependent fluid is seen in the visualize left maxillary sinus. Remaining sinuses are clear as are the mastoid air cells. No skull fracture. CT CERVICAL SPINE FINDINGS No fracture. No spondylolisthesis. There is a reversal of the normal cervical lordosis. This is centered at C4. There is marked loss of disc height at C3-4 through C6-C7. Endplate spurring is noted at these levels. Facet joints are relatively well preserved. Bones are diffusely demineralized. Soft tissues are unremarkable.  Lung apices are clear. IMPRESSION: HEAD CT: No acute intracranial abnormalities. Mild chronic microvascular ischemic change. Small air-fluid level in the visualize left maxillary sinus, nonspecific. This was present on the prior CT. It is likely inflammatory in origin. CERVICAL CT:  No fracture or acute finding. Electronically Signed   By: Lajean Manes M.D.   On: 08/07/2015 20:49   Ct Cervical Spine Wo Contrast  08/07/2015  CLINICAL DATA:  Patient fell tonight at home while going to  the bathroom. Complaining of left-sided chest wall pain. EXAM: CT HEAD WITHOUT CONTRAST CT CERVICAL SPINE WITHOUT CONTRAST TECHNIQUE: Multidetector CT imaging of the head and cervical spine was performed following the standard protocol without intravenous contrast. Multiplanar CT image reconstructions of the cervical spine were also generated. COMPARISON:  07/02/2015 FINDINGS: CT HEAD FINDINGS Ventricles are normal in size and configuration. There are no parenchymal masses or mass effect. There is no evidence of a cortical infarct. Mild periventricular white matter hypoattenuation is noted consistent with chronic microvascular ischemic change. There are no extra-axial masses or abnormal fluid collections. There is no intracranial hemorrhage. Small amount of dependent fluid is seen in the visualize left maxillary sinus. Remaining sinuses are clear as are the mastoid air cells. No skull fracture. CT CERVICAL SPINE FINDINGS No fracture. No spondylolisthesis. There is a reversal of the normal cervical lordosis. This is centered at C4.  There is marked loss of disc height at C3-4 through C6-C7. Endplate spurring is noted at these levels. Facet joints are relatively well preserved. Bones are diffusely demineralized. Soft tissues are unremarkable.  Lung apices are clear. IMPRESSION: HEAD CT: No acute intracranial abnormalities. Mild chronic microvascular ischemic change. Small air-fluid level in the visualize left maxillary sinus, nonspecific. This was present on the prior CT. It is likely inflammatory in origin. CERVICAL CT:  No fracture or acute finding. Electronically Signed   By: Lajean Manes M.D.   On: 08/07/2015 20:49   Dg Chest Port 1 View  08/08/2015  CLINICAL DATA:  Sepsis. History of diabetes, hypertension and stroke. EXAM: PORTABLE CHEST 1 VIEW COMPARISON:  Chest radiograph August 07, 2015 FINDINGS: Patchy RIGHT middle lobe airspace opacity. Strandy densities LEFT lung base. No pleural effusion. Cardiac  silhouette is normal. Mildly tortuous, possibly ectatic aorta. Focal pleural thickening associated with LEFT rib fracture which was documented on yesterday's radiographs. IMPRESSION: RIGHT middle lobe atelectasis/ pneumonia. LEFT lung base atelectasis. Electronically Signed   By: Elon Alas M.D.   On: 08/08/2015 00:53    Microbiology: Recent Results (from the past 240 hour(s))  MRSA PCR Screening     Status: None   Collection Time: 08/21/15  4:58 PM  Result Value Ref Range Status   MRSA by PCR NEGATIVE NEGATIVE Final    Comment:        The GeneXpert MRSA Assay (FDA approved for NASAL specimens only), is one component of a comprehensive MRSA colonization surveillance program. It is not intended to diagnose MRSA infection nor to guide or monitor treatment for MRSA infections.      Labs: Basic Metabolic Panel:  Recent Labs Lab 08/22/15 0335 08/22/15 0820 08/22/15 1220 08/23/15 0557 08/24/15 0534  NA 141 140 139 139 137  K 3.9 3.8 4.1 3.6 3.3*  CL 111 109 108 108 108  CO2 21* 21* 17* 23 23  GLUCOSE 125* 135* 202* 112* 131*  BUN 43* 43* 42* 29* 18  CREATININE 2.96* 2.74* 2.75* 1.88* 1.28*  CALCIUM 8.5* 8.7* 8.5* 8.0* 7.9*   Liver Function Tests:  Recent Labs Lab 08/21/15 1335  AST 30  ALT 29  ALKPHOS 115  BILITOT 1.6*  PROT 6.9  ALBUMIN 3.7    Recent Labs Lab 08/21/15 1335  LIPASE 27   No results for input(s): AMMONIA in the last 168 hours. CBC:  Recent Labs Lab 08/21/15 1335 08/22/15 0335 08/23/15 0557  WBC 13.0* 10.3 7.8  NEUTROABS 11.0*  --   --   HGB 9.7* 8.3* 8.4*  HCT 32.0* 25.4* 25.3*  MCV 100.0 91.7 91.0  PLT 245 209 163   Cardiac Enzymes: No results for input(s): CKTOTAL, CKMB, CKMBINDEX, TROPONINI in the last 168 hours. BNP: BNP (last 3 results) No results for input(s): BNP in the last 8760 hours.  ProBNP (last 3 results) No results for input(s): PROBNP in the last 8760 hours.  CBG:  Recent Labs Lab 08/23/15 0719  08/23/15 1155 08/23/15 1737 08/23/15 2154 08/24/15 0734  GLUCAP 139* 195* 133* 137* 149*       Signed:  Brandi Armato A  [facility] 08/24/2015, 10:27 AM

## 2015-08-24 NOTE — Progress Notes (Signed)
Gave report to [place], Therapist, sports at [facility]. Left message in case she had additional questions.

## 2015-08-24 NOTE — Care Management Important Message (Signed)
Important Message  Patient Details  Name: Horton Rhodes MRN: [id number] Date of Birth: Nov 14, 1945   Medicare Important Message Given:  Yes    Camillo Flaming 08/24/2015, 1:57 PMImportant Message  Patient Details  Name: Brittney Remund MRN: [id number] Date of Birth: 1946/09/12   Medicare Important Message Given:  Yes    Camillo Flaming 08/24/2015, 1:56 PM

## 2015-09-10 ENCOUNTER — Ambulatory Visit (INDEPENDENT_AMBULATORY_CARE_PROVIDER_SITE_OTHER): Payer: Medicare HMO | Admitting: Neurology

## 2015-09-10 ENCOUNTER — Encounter: Payer: Self-pay | Admitting: Neurology

## 2015-09-10 VITALS — BP 113/69 | HR 61 | Ht 69.0 in | Wt 142.4 lb

## 2015-09-10 DIAGNOSIS — I63331 Cerebral infarction due to thrombosis of right posterior cerebral artery: Secondary | ICD-10-CM | POA: Diagnosis not present

## 2015-09-10 DIAGNOSIS — E0810 Diabetes mellitus due to underlying condition with ketoacidosis without coma: Secondary | ICD-10-CM

## 2015-09-10 DIAGNOSIS — E1159 Type 2 diabetes mellitus with other circulatory complications: Secondary | ICD-10-CM | POA: Diagnosis not present

## 2015-09-10 DIAGNOSIS — I10 Essential (primary) hypertension: Secondary | ICD-10-CM

## 2015-09-10 DIAGNOSIS — E785 Hyperlipidemia, unspecified: Secondary | ICD-10-CM | POA: Diagnosis not present

## 2015-09-10 NOTE — Patient Instructions (Addendum)
-   continue ASA and lipitor for stroke prevention - obtain BP monitoring device and check BP at home. - control DM is very important for you, please follow up with Dr. Elna Breslow tomorrow as scheduled and compliant with medication. Check glucose at home and record and bring over to Dr. Elna Breslow for insulin adjustment. - Follow up with your primary care physician for stroke risk factor modification. Recommend maintain blood pressure goal <130/80, diabetes with hemoglobin A1c goal below 6.5% and lipids with LDL cholesterol goal below 70 mg/dL.  - follow up as needed.

## 2015-09-10 NOTE — Progress Notes (Signed)
STROKE NEUROLOGY FOLLOW UP NOTE  NAME: Karry Gaebler DOB: Jul 07, 1946  REASON FOR VISIT: stroke follow up HISTORY FROM: chart and pt  Today we had the pleasure of seeing Travas Orndoff in follow-up at our Neurology Clinic. Pt was accompanied by no one.   History Summary Mr. Chanze Vanegas is a 69 y.o. male with history of DM, CKD, HTN and chronic back pain was admitted on 03/19/15 for generalized weakness and lethargy. He did not receive IV t-PA due to stroke not suspected. However, MRI showed right thalamic infarct, likely due to small vessel disease. Stroke workup including MRA, carotid Doppler and 2-D echo were all unremarkable. LDL 77, however A1c 10.6, indicating his diabetes not in good control. He was started on aspirin 81 and put on low-dose Lipitor 10. Symptoms resolved and he was discharged in good condition.  06/08/15 follow up - the patient has been doing well. No recurrent stroke like symptoms. He follows up with his PCP, increased insulin dose for activities control, and continued ramipril for blood pressure control. His blood pressure today 146/76. He stated that he does not have blood pressure device at home, and he is going to get one from his pharmacy. He also recently moved to live with his mom, so his glucometer was not with him, however he checked several times with his sister's glucometer and was still high around 200-250.    Interval History During the interval time, he has been doing fine from stroke standpoint, no recurrent stroke like symptoms. However, he was admitted 4 times during the interval time for uncontrolled DM, DKA and HHS with A1C 13.4 and glucose 1028 as the worst. Currently, he is on lantus and his sister are helping him to manage glucose. He stated that his glucose running 97 to 239. Sometimes, at 95s and he has to eat something. His bp today 113/69 and he does not check BP at home. He has PCP appointment tomorrow.  REVIEW OF SYSTEMS: Full 14 system review of systems  performed and notable only for those listed below and in HPI above, all others are negative:  Constitutional:   Cardiovascular:  Ear/Nose/Throat:   Skin:  Eyes:   Respiratory:   Gastroitestinal:   Genitourinary:  Hematology/Lymphatic:   Endocrine:  Musculoskeletal:   Allergy/Immunology:   Neurological:   Psychiatric:  Sleep:   The following represents the patient's updated allergies and side effects list: No Known Allergies  The neurologically relevant items on the patient's problem list were reviewed on today's visit.  Neurologic Examination  A problem focused neurological exam (12 or more points of the single system neurologic examination, vital signs counts as 1 point, cranial nerves count for 8 points) was performed.  Blood pressure 113/69, pulse 61, height 5\' 9"  (1.753 m), weight 142 lb 6.4 oz (64.592 kg).  General - Well nourished, well developed, in no apparent distress.  Ophthalmologic - Sharp disc margins OU.  Cardiovascular - Regular rate and rhythm with no murmur.  Mental Status -  Level of arousal and orientation to time, place, and person were intact. Language including expression, naming, repetition, comprehension was assessed and found intact. Fund of Knowledge was assessed and was intact.  Cranial Nerves II - XII - II - Visual field intact OU. III, IV, VI - Extraocular movements intact. V - Facial sensation intact bilaterally. VII - Facial movement intact bilaterally. VIII - Hearing & vestibular intact bilaterally. X - Palate elevates symmetrically. XI - Chin turning & shoulder shrug intact bilaterally. XII -  Tongue protrusion intact.  Motor Strength - The patient's strength was normal in all extremities and pronator drift was absent.  Bulk was normal and fasciculations were absent.   Motor Tone - Muscle tone was assessed at the neck and appendages and was normal.  Reflexes - The patient's reflexes were 1+ in all extremities and he had no  pathological reflexes.  Sensory - Light touch, temperature/pinprick, vibration and proprioception, and Romberg testing were assessed and were normal.    Coordination - The patient had normal movements in the hands and feet with no ataxia or dysmetria.  Tremor was absent.  Gait and Station - The patient's transfers, posture, gait, station, and turns were observed as normal.  Data reviewed: I personally reviewed the images and agree with the radiology interpretations.  MRI brain 03/20/15 1. Subtle 1 cm acute ischemic infarct within the right thalamus. No associated hemorrhage or mass effect. 2. No other acute intracranial process. 3. Atrophy with mild chronic small vessel ischemic disease. 4. 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   MRI brain 07/07/15 - no acute abnormality  CT 08/07/15 -  HEAD CT: No acute intracranial abnormalities. Mild chronic microvascular ischemic change. Small air-fluid level in the visualize left maxillary sinus, nonspecific. This was present on the prior CT. It is likely inflammatory in origin.  CERVICAL CT: No fracture or acute finding.  Mr Virgel Paling [id number] Contrast 03/20/15 03/20/2015 Motion degraded study. No significant intracranial stenosis or occlusion.   US Renal 03/20/2015 Left renal cysts. No acute abnormality noted.   2D Echocardiogram  - Left ventricle: The cavity size was normal. Wall thickness wasnormal. Systolic function was normal. The estimated ejectionfraction was in the range of 60% to 65%. Wall motion was normal;there were no regional wall motion abnormalities. Leftventricular diastolic function parameters were normal. - Right ventricle: The cavity size was mildly dilated. - Atrial septum: No defect or patent foramen ovale was identified. - Tricuspid valve: There was moderate regurgitation.  Carotid Doppler There is 1-39% bilateral ICA stenosis.  Vertebral artery flow is antegrade.   Component     Latest Ref Rng 03/21/2015  Cholesterol     0 - 200 mg/dL 148  Triglycerides     <150 mg/dL 139  HDL Cholesterol     >40 mg/dL 43  Total CHOL/HDL Ratio      3.4  VLDL     0 - 40 mg/dL 28  LDL (calc)     0 - 99 mg/dL 77  Hemoglobin A1C     4.8 - 5.6 % 10.6 (H)  Mean Plasma Glucose      258   Component     Latest Ref Rng 08/08/2015  Cholesterol     0 - 200 mg/dL 134  Triglycerides     <150 mg/dL 163 (H)  HDL Cholesterol     >40 mg/dL 46  Total CHOL/HDL Ratio      2.9  VLDL     0 - 40 mg/dL 33  LDL (calc)     0 - 99 mg/dL 55  Hemoglobin A1C     4.8 - 5.6 % 13.3 (H)  Mean Plasma Glucose      335  TSH     0.350 - 4.500 uIU/mL 2.200    Assessment: As you may recall, he is a 69 y.o. African American male with PMH of HTN, DM, CKD, chronic lower back pain was admitted on 03/19/2015 for right thalamic  infarct, likely due to small vessel disease. Stroke workup negative including MRA, carotid Doppler and 2-D echo, LDL 77, however, A1c 10.6 indicating diabetes not in good control. He was discharged on aspirin and Lipitor. He continues follow-up with his PCP for diabetes and BP control. Increased aspirin from 81 to 325. His DM was not in good control and he was admitted several times during interval time for DKA, HHS. Currently on lantus. Not checking BP at home. Has PCP appointment tomorrow.  Plan:  - continue ASA and lipitor for stroke prevention - obtain BP monitoring device and check BP at home. - Check glucose at home and record and bring over to Dr. Elna Breslow for insulin adjustment and compliant with insulin therapy. - Follow up with your primary care physician for stroke risk factor modification. Recommend maintain blood pressure goal <130/80, diabetes with hemoglobin A1c goal below 6.5% and lipids with LDL cholesterol goal below 70 mg/dL.  - follow up as needed.  No orders of the defined types were placed in this  encounter.    No orders of the defined types were placed in this encounter.    Patient Instructions  - continue ASA and lipitor for stroke prevention - obtain BP monitoring device and check BP at home. - control DM is very important for you, please follow up with Dr. Elna Breslow tomorrow as scheduled and compliant with medication. Check glucose at home and record and bring over to Dr. Elna Breslow for insulin adjustment. - Follow up with your primary care physician for stroke risk factor modification. Recommend maintain blood pressure goal <130/80, diabetes with hemoglobin A1c goal below 6.5% and lipids with LDL cholesterol goal below 70 mg/dL.  - follow up as needed.    Rosalin Hawking, MD PhD [facility] [address], [place], [place] [address] [phone]

## 2015-09-11 ENCOUNTER — Ambulatory Visit (INDEPENDENT_AMBULATORY_CARE_PROVIDER_SITE_OTHER): Payer: Medicare HMO | Admitting: Family

## 2015-09-11 ENCOUNTER — Encounter: Payer: Self-pay | Admitting: Family

## 2015-09-11 VITALS — BP 132/80 | HR 77 | Temp 98.2°F | Resp 18 | Ht 69.0 in | Wt 146.1 lb

## 2015-09-11 DIAGNOSIS — IMO0002 Reserved for concepts with insufficient information to code with codable children: Secondary | ICD-10-CM

## 2015-09-11 DIAGNOSIS — E1121 Type 2 diabetes mellitus with diabetic nephropathy: Secondary | ICD-10-CM

## 2015-09-11 DIAGNOSIS — Z794 Long term (current) use of insulin: Secondary | ICD-10-CM

## 2015-09-11 DIAGNOSIS — E1165 Type 2 diabetes mellitus with hyperglycemia: Secondary | ICD-10-CM | POA: Diagnosis not present

## 2015-09-11 NOTE — Patient Instructions (Addendum)
Thank you for choosing [facility].  Summary/Instructions:   Please increase your Lantus to 12 units at night. If your morning blood sugars do not AVERAGE 80-130 in the mornings after a week increase to 14 units nightly. If after a week the goal is still above 80-130 increase to 16 units. Watch for low blood sugars.   Please continue your Novolog as prescribed.   Continue all other prescribed medications.   If your symptoms worsen or fail to improve, please contact our office for further instruction, or in case of emergency go directly to the emergency room at the closest medical facility.

## 2015-09-11 NOTE — Progress Notes (Signed)
Subjective:    Patient ID: [name] [name], male    DOB: [birth year], 69 y.o.   MRN: [id number]  Chief Complaint  Patient presents with  . Hospitalization Follow-up    HPI:  [name] [name] is a 69 y.o. male who  has a past medical history of Diabetes mellitus without complication ([place]); Hypertension; Hernia; Back pain; Seizures ([place]); Diabetic neuropathy ([place]); and Stroke ([place]). and presents today for an office follow up after hospitalization.  Recently evaluated in the ED and admitted to the hospital for altered mental status and elevated blood sugars. Patient's blood sugar was found to be 1028 and and was hyperkalemic at 7.1. He was started on a glucostabilizer and admitted with DKA. Blood sugars were normalized and he was started on 10 units of Lantus. His 70/30 insulin was discontinued with questionable ability to care for himself. Physical therapy evaluation indicated need for skilled nursing facility. He was also noted to have acute kidney injury which was resolved at discharge. The acute encephalopathy was also resolved and he was alert and oriented 3 upon discharge. He was discharged on 10 units of Lantus daily and 3 units of NovoLog with meals. He was disharged to home and family is helping him to manage his regimen. All hospital records and results were reviewed in detail.   Since leaving the hospital he reports that he is working to get into a routine and emphasizing taking his medications. Home blood sugars have been 190 and 209 in the morning. He takes his Lantus and Novolog as prescribed since leaving the hospital. Able to complete his activities of daily living. He is currently staying with his sister who is helping to manage his regimen.  Due for diabetic prevention.  Taking atorvastatin for CAD risk reduction. Not currently maintained on an ACE/ARB. Due for foot and eye exam. Due for pneumonia vaccination.    No Known Allergies   Current Outpatient Prescriptions on File Prior to Visit   Medication Sig Dispense Refill  . acetaminophen (TYLENOL) 325 MG tablet Take 2 tablets (650 mg total) by mouth every 6 (six) hours as needed for mild pain (or Fever >/= 101). 30 tablet 0  . alprazolam (XANAX) 2 MG tablet Take 0.5 tablets (1 mg total) by mouth 2 (two) times daily as needed for anxiety. 10 tablet 0  . amLODipine (NORVASC) 10 MG tablet Take 1 tablet (10 mg total) by mouth daily. 30 tablet 0  . aspirin EC 325 MG tablet Take 1 tablet (325 mg total) by mouth daily. 90 tablet 3  . atorvastatin (LIPITOR) 10 MG tablet Take 1 tablet (10 mg total) by mouth daily at 6 PM. (Patient taking differently: Take 10 mg by mouth daily. ) 30 tablet 0  . blood glucose meter kit and supplies Dispense based on patient and insurance preference. Use up to four times daily as directed. (FOR ICD-9 250.00, 250.01). 1 each 0  . feeding supplement, GLUCERNA SHAKE, (GLUCERNA SHAKE) LIQD Take 237 mLs by mouth 2 (two) times daily between meals. 237 mL 0  . FLUoxetine (PROZAC) 20 MG capsule Take 20 mg by mouth daily.    . folic acid (FOLVITE) 1 MG tablet Take 1 mg by mouth daily.    . insulin aspart (NOVOLOG) 100 UNIT/ML injection Inject 3 Units into the skin 3 (three) times daily with meals.    . insulin glargine (LANTUS) 100 UNIT/ML injection Inject 0.1 mLs (10 Units total) into the skin daily.    Marland Kitchen PRESCRIPTION MEDICATION Place 1  Dose into the right eye every 30 (thirty) days. Eye injection     No current facility-administered medications on file prior to visit.    Review of Systems  Constitutional: Negative for fever and chills.  Eyes:       Negative for changes in vision  Respiratory: Negative for chest tightness and shortness of breath.   Cardiovascular: Negative for chest pain, palpitations and leg swelling.  Neurological: Negative for headaches.      Objective:    BP 132/80 mmHg  Pulse 77  Temp(Src) 98.2 F (36.8 C) (Oral)  Resp 18  Ht '5\' 9"'  (1.753 m)  Wt 146 lb 1.9 oz (66.28 kg)  BMI 21.57  kg/m2  SpO2 95% Nursing note and vital signs reviewed.  Physical Exam  Constitutional: He is oriented to person, place, and time. He appears well-developed and well-nourished. No distress.  Cardiovascular: Normal rate, regular rhythm, normal heart sounds and intact distal pulses.   Pulmonary/Chest: Effort normal and breath sounds normal.  Neurological: He is alert and oriented to person, place, and time.  Diabetic foot exam - bilateral feet are free from skin breakdown, cuts, and abrasions. Toenails are irregular shaped and poorly managed. Pulses are intact and 1+. Sensation is intact to monofilament bilaterally.   Skin: Skin is warm and dry.  Psychiatric: He has a normal mood and affect. His behavior is normal. Judgment and thought content normal.       Assessment & Plan:   Problem List Items Addressed This Visit      Endocrine   Uncontrolled type 2 diabetes mellitus with diabetic nephropathy, with long-term current use of insulin ([place]) - Primary (Chronic)    Diabetes remains uncontrolled with previous A1c of 13.3 approximately one month ago. In office glucose check of 111. Diabetic foot exam completed today. Patient encouraged to call Dr. Zigmund Daniel to schedule an eye exam. Refer to podiatry for diabetic foot care. Increase Lantus to 12 units nightly and continue NovoLog 3 units with meals daily. Instructions provided to increase Lantus pending blood sugar results on a weekly basis. Continue current dosage of atorvastatin for CAD risk reduction. Will consider an ACE inhibitor pending kidney function.      Relevant Orders   Ambulatory referral to Podiatry

## 2015-09-11 NOTE — Progress Notes (Signed)
Pre visit review using our clinic review tool, if applicable. No additional management support is needed unless otherwise documented below in the visit note. 

## 2015-09-11 NOTE — Assessment & Plan Note (Signed)
Diabetes remains uncontrolled with previous A1c of 13.3 approximately one month ago. In office glucose check of 111. Diabetic foot exam completed today. Patient encouraged to call Dr. Zigmund Daniel to schedule an eye exam. Refer to podiatry for diabetic foot care. Increase Lantus to 12 units nightly and continue NovoLog 3 units with meals daily. Instructions provided to increase Lantus pending blood sugar results on a weekly basis. Continue current dosage of atorvastatin for CAD risk reduction. Will consider an ACE inhibitor pending kidney function.

## 2015-09-25 ENCOUNTER — Emergency Department (HOSPITAL_COMMUNITY): Payer: Medicare HMO

## 2015-09-25 ENCOUNTER — Observation Stay (HOSPITAL_COMMUNITY)
Admission: EM | Admit: 2015-09-25 | Discharge: 2015-09-27 | Disposition: A | Discharge status: Home / Self Care | Payer: Medicare HMO | Attending: Internal Medicine | Admitting: Internal Medicine

## 2015-09-25 ENCOUNTER — Encounter (HOSPITAL_COMMUNITY): Payer: Self-pay

## 2015-09-25 DIAGNOSIS — G8929 Other chronic pain: Secondary | ICD-10-CM | POA: Diagnosis not present

## 2015-09-25 DIAGNOSIS — Z794 Long term (current) use of insulin: Secondary | ICD-10-CM | POA: Insufficient documentation

## 2015-09-25 DIAGNOSIS — Z9114 Patient's other noncompliance with medication regimen: Secondary | ICD-10-CM | POA: Insufficient documentation

## 2015-09-25 DIAGNOSIS — E1140 Type 2 diabetes mellitus with diabetic neuropathy, unspecified: Secondary | ICD-10-CM | POA: Diagnosis not present

## 2015-09-25 DIAGNOSIS — E1165 Type 2 diabetes mellitus with hyperglycemia: Secondary | ICD-10-CM | POA: Diagnosis not present

## 2015-09-25 DIAGNOSIS — Z87891 Personal history of nicotine dependence: Secondary | ICD-10-CM | POA: Diagnosis not present

## 2015-09-25 DIAGNOSIS — Z7982 Long term (current) use of aspirin: Secondary | ICD-10-CM | POA: Diagnosis not present

## 2015-09-25 DIAGNOSIS — Z79891 Long term (current) use of opiate analgesic: Secondary | ICD-10-CM | POA: Insufficient documentation

## 2015-09-25 DIAGNOSIS — E860 Dehydration: Secondary | ICD-10-CM | POA: Diagnosis not present

## 2015-09-25 DIAGNOSIS — G9340 Encephalopathy, unspecified: Secondary | ICD-10-CM | POA: Insufficient documentation

## 2015-09-25 DIAGNOSIS — I10 Essential (primary) hypertension: Secondary | ICD-10-CM | POA: Insufficient documentation

## 2015-09-25 DIAGNOSIS — Z7902 Long term (current) use of antithrombotics/antiplatelets: Secondary | ICD-10-CM | POA: Diagnosis not present

## 2015-09-25 DIAGNOSIS — Z79899 Other long term (current) drug therapy: Secondary | ICD-10-CM | POA: Diagnosis not present

## 2015-09-25 DIAGNOSIS — IMO0002 Reserved for concepts with insufficient information to code with codable children: Secondary | ICD-10-CM

## 2015-09-25 DIAGNOSIS — Z8673 Personal history of transient ischemic attack (TIA), and cerebral infarction without residual deficits: Secondary | ICD-10-CM | POA: Insufficient documentation

## 2015-09-25 DIAGNOSIS — Z96649 Presence of unspecified artificial hip joint: Secondary | ICD-10-CM | POA: Diagnosis not present

## 2015-09-25 DIAGNOSIS — E1121 Type 2 diabetes mellitus with diabetic nephropathy: Secondary | ICD-10-CM

## 2015-09-25 DIAGNOSIS — R739 Hyperglycemia, unspecified: Secondary | ICD-10-CM

## 2015-09-25 LAB — URINALYSIS, ROUTINE W REFLEX MICROSCOPIC
Bilirubin Urine: NEGATIVE
Glucose, UA: >1000 mg/dL — AB
Hgb urine dipstick: NEGATIVE
Ketones, ur: NEGATIVE mg/dL
Leukocytes, UA: NEGATIVE
Nitrite: NEGATIVE
Protein, ur: NEGATIVE mg/dL
Specific Gravity, Urine: 1.027 (ref 1.005–1.030)
pH: 5 (ref 5.0–8.0)

## 2015-09-25 LAB — BASIC METABOLIC PANEL
Anion gap: 13 (ref 5–15)
BUN: 41 mg/dL — ABNORMAL HIGH (ref 6–20)
CO2: 21 mmol/L — ABNORMAL LOW (ref 22–32)
Calcium: 8.9 mg/dL (ref 8.9–10.3)
Chloride: 89 mmol/L — ABNORMAL LOW (ref 101–111)
Creatinine, Ser: 1.34 mg/dL — ABNORMAL HIGH (ref 0.61–1.24)
GFR calc Af Amer: >60 mL/min (ref >60)
GFR calc non Af Amer: 52 mL/min — ABNORMAL LOW (ref >60)
Glucose, Bld: 898 mg/dL (ref 65–99)
Potassium: 4.5 mmol/L (ref 3.5–5.1)
Sodium: 123 mmol/L — ABNORMAL LOW (ref 135–145)

## 2015-09-25 LAB — URINE MICROSCOPIC-ADD ON
Bacteria, UA: NONE SEEN
RBC / HPF: NONE SEEN RBC/hpf (ref 0–5)

## 2015-09-25 LAB — CBG MONITORING, ED
Glucose-Capillary: >600 mg/dL (ref 65–99)
Glucose-Capillary: >600 mg/dL (ref 65–99)
Glucose-Capillary: >600 mg/dL (ref 65–99)

## 2015-09-25 LAB — CBC
HCT: 34 % — ABNORMAL LOW (ref 39.0–52.0)
Hemoglobin: 10.5 g/dL — ABNORMAL LOW (ref 13.0–17.0)
MCH: 30 pg (ref 26.0–34.0)
MCHC: 30.9 g/dL (ref 30.0–36.0)
MCV: 97.1 fL (ref 78.0–100.0)
Platelets: 240 10*3/uL (ref 150–400)
RBC: 3.5 MIL/uL — ABNORMAL LOW (ref 4.22–5.81)
RDW: 14 % (ref 11.5–15.5)
WBC: 9.2 10*3/uL (ref 4.0–10.5)

## 2015-09-25 LAB — BETA-HYDROXYBUTYRIC ACID: Beta-Hydroxybutyric Acid: 0.6 mmol/L — ABNORMAL HIGH (ref 0.05–0.27)

## 2015-09-25 LAB — BLOOD GAS, VENOUS
Acid-base deficit: 3 mmol/L — ABNORMAL HIGH (ref 0.0–2.0)
Bicarbonate: 21.3 mEq/L (ref 20.0–24.0)
O2 Saturation: 81.5 %
Patient temperature: 98.6
TCO2: 20.9 mmol/L (ref 0–100)
pCO2, Ven: 37.3 mmHg — ABNORMAL LOW (ref 45.0–50.0)
pH, Ven: 7.375 — ABNORMAL HIGH (ref 7.250–7.300)
pO2, Ven: 44.6 mmHg (ref 30.0–45.0)

## 2015-09-25 MED ORDER — SODIUM CHLORIDE 0.9 % IV BOLUS (SEPSIS)
1000.0000 mL | Freq: Once | INTRAVENOUS | Status: AC
  Administered 2015-09-25: 1000 mL via INTRAVENOUS

## 2015-09-25 MED ORDER — SODIUM CHLORIDE 0.9 % IV BOLUS (SEPSIS): 2000.0000 mL | Freq: Once | INTRAVENOUS | Status: DC

## 2015-09-25 MED ORDER — DEXTROSE-NACL 5-0.45 % IV SOLN: INTRAVENOUS | Status: DC

## 2015-09-25 MED ORDER — SODIUM CHLORIDE 0.9 % IV SOLN
INTRAVENOUS | Status: DC
  Filled 2015-09-25: qty 2.5

## 2015-09-25 MED ORDER — INSULIN ASPART 100 UNIT/ML ~~LOC~~ SOLN
10.0000 [IU] | Freq: Once | SUBCUTANEOUS | Status: DC
  Filled 2015-09-25: qty 1

## 2015-09-25 NOTE — ED Notes (Signed)
Pt states that his blood sugar has been elevated over the last 2 days; pt states that he was to be get a sliding scale for his insulin coverage but never received it; pt states that he has been self dosing his insulin; pt c/o generalized weakness and not feeling well over the last 2 days; pt also c/o increased thirst and increased urination

## 2015-09-25 NOTE — ED Notes (Signed)
Pt complains of high blood sugar for three days, he states that he's dizzy ans feels confused

## 2015-09-25 NOTE — ED Provider Notes (Signed)
CSN: [id number]     Arrival date & time 09/25/15  2024 History   First MD Initiated Contact with Patient 09/25/15 2108     Chief Complaint  Patient presents with  . Hyperglycemia     (Consider location/radiation/quality/duration/timing/severity/associated sxs/prior Treatment) HPI   Blood pressure 141/75, pulse 71, temperature 98.7 F (37.1 C), temperature source Oral, resp. rate 16, SpO2 96 %.  [name] [name] is a 69 y.o. male with past medical history significant for insulin-dependent diabetes, hypertension, seizures complaining of hyperglycemia onset 2 days ago. He states he's been compliant with his insulin, reading on his home glucometer is just read high. He states that he's been much more tired than normal and has had polyuria. Patient denies chest pain, shortness of breath, fever, chills, cough, abdominal pain, nausea, vomiting. Triage note states that he has been confused but he states that he's really just felt lightheaded, no unilateral weakness, change in his vision, difficulty walking.  Past Medical History  Diagnosis Date  . Diabetes mellitus without complication ([place])   . Hypertension   . Hernia   . Back pain   . Seizures ([place])   . Diabetic neuropathy ([place])   . Stroke [facility])    Past Surgical History  Procedure Laterality Date  . Hip arthroplasty Right 02/05/2013    Procedure: ARTHROPLASTY BIPOLAR HIP;  Surgeon: [name], MD;  Location: [facility];  Service: Orthopedics;  Laterality: Right;   Family History  Problem Relation Age of Onset  . Diabetes Mother   . Alzheimer's disease Mother   . Hypertension Mother   . Hyperlipidemia Father   . Hypertension Father   . Healthy Maternal Grandmother   . Pneumonia Maternal Grandfather    Social History  Substance Use Topics  . Smoking status: Former Smoker -- 0.25 packs/day for 30 years    Types: Cigarettes    Quit date: 01/31/2013  . Smokeless tobacco: Never Used  . Alcohol Use: No    Review of Systems  10  systems reviewed and found to be negative, except as noted in the HPI.   Allergies  Review of patient's allergies indicates no known allergies.  Home Medications   Prior to Admission medications   Medication Sig Start Date End Date Taking? Authorizing Provider  acetaminophen (TYLENOL) 325 MG tablet Take 2 tablets (650 mg total) by mouth every 6 (six) hours as needed for mild pain (or Fever >/= 101). 08/14/15   Robbie Lis, MD  alprazolam [name] [name]) 2 MG tablet Take 0.5 tablets (1 mg total) by mouth 2 (two) times daily as needed for anxiety. 08/24/15   Verlee Monte, MD  amLODipine (NORVASC) 10 MG tablet Take 1 tablet (10 mg total) by mouth daily. 08/14/15   Robbie Lis, MD  aspirin EC 325 MG tablet Take 1 tablet (325 mg total) by mouth daily. 06/08/15   Rosalin Hawking, MD  atorvastatin (LIPITOR) 10 MG tablet Take 1 tablet (10 mg total) by mouth daily at 6 PM. Patient taking differently: Take 10 mg by mouth daily.  03/23/15   Mechele Claude, DO  blood glucose meter kit and supplies Dispense based on patient and insurance preference. Use up to four times daily as directed. (FOR ICD-9 250.00, 250.01). 07/04/15   Donita Brooks, NP  feeding supplement, GLUCERNA SHAKE, (GLUCERNA SHAKE) LIQD Take 237 mLs by mouth 2 (two) times daily between meals. 08/14/15   Robbie Lis, MD  FLUoxetine (PROZAC) 20 MG capsule Take 20 mg by mouth daily.  Historical Provider, MD  folic acid (FOLVITE) 1 MG tablet Take 1 mg by mouth daily.    Historical Provider, MD  insulin aspart (NOVOLOG) 100 UNIT/ML injection Inject 3 Units into the skin 3 (three) times daily with meals. 08/24/15   Verlee Monte, MD  insulin glargine (LANTUS) 100 UNIT/ML injection Inject 0.1 mLs (10 Units total) into the skin daily. 08/24/15   Verlee Monte, MD  PRESCRIPTION MEDICATION Place 1 Dose into the right eye every 30 (thirty) days. Eye injection    Historical Provider, MD   BP 154/80 mmHg  Pulse 87  Temp(Src) 98.7 F (37.1 C) (Oral)  Resp  20  SpO2 100% Physical Exam  Constitutional: He is oriented to person, place, and time. He appears well-developed and well-nourished. No distress.  HENT:  Head: Normocephalic and atraumatic.  Mouth/Throat: Oropharynx is clear and moist.  Eyes: Conjunctivae and EOM are normal. Pupils are equal, round, and reactive to light.  Neck: Normal range of motion.  Cardiovascular: Normal rate, regular rhythm and intact distal pulses.   Pulmonary/Chest: Effort normal and breath sounds normal.  Abdominal: Soft. There is no tenderness.  Musculoskeletal: Normal range of motion.  Neurological: He is alert and oriented to person, place, and time.  Follows commands, Clear, goal oriented speech, Strength is 5 out of 5x4 extremities, patient ambulates with a coordinated in nonantalgic gait. Sensation is grossly intact.   Skin: He is not diaphoretic.  Psychiatric: He has a normal mood and affect.  Nursing note and vitals reviewed.   ED Course  Procedures (including critical care time) Labs Review Labs Reviewed  CBG MONITORING, ED - Abnormal; Notable for the following:    Glucose-Capillary >600 (*)    All other components within normal limits  BASIC METABOLIC PANEL  CBC  URINALYSIS, ROUTINE W REFLEX MICROSCOPIC (NOT AT [facility])  BLOOD GAS, VENOUS  BETA-HYDROXYBUTYRIC ACID    Imaging Review No results found. I have personally reviewed and evaluated these images and lab results as part of my medical decision-making.   EKG Interpretation None      MDM   Final diagnoses:  Hyperglycemia    Filed Vitals:   09/25/15 2136 09/25/15 2303 09/25/15 2316 09/25/15 2337  BP: 141/75 135/62  126/69  Pulse: 71  72 75  Temp:    99.7 F (37.6 C)  TempSrc:    Rectal  Resp: '16 21 24 20  ' SpO2: 96%  96% 95%    Medications  sodium chloride 0.9 % bolus 2,000 mL (not administered)  insulin aspart (novoLOG) injection 10 Units (not administered)  dextrose 5 %-0.45 % sodium chloride infusion (not  administered)  insulin regular (NOVOLIN R,HUMULIN R) 250 Units in sodium chloride 0.9 % 250 mL (1 Units/mL) infusion (not administered)  dextrose 5 %-0.45 % sodium chloride infusion (not administered)  insulin regular bolus via infusion 0-10 Units (not administered)  dextrose 50 % solution 25 mL (not administered)  0.9 %  sodium chloride infusion (not administered)  sodium chloride 0.9 % bolus 1,000 mL (1,000 mLs Intravenous New Bag/Given 09/25/15 2137)    Juvon Teater is 69 y.o. male presenting with hyperglycemia and fatigue. Patient has no other symptoms, states he's been compliant with his insulin. Patient has history of stroke, unclear if there was confusion, patient is very poor historian. His neuro exam is nonfocal. Will obtain head CT.  Patient's glucose is extremely elevated at 900. HEENT his creatinine is elevated today but is not atypical for his baseline. He has a normal anion  gap, urinalysis with no ketones however, beta hydroxybutyric acid is elevated at 0.6. Discussed with attending who recommends initiating glucose stabilizer and admission.  Dr. Alcario Drought will admit the patient.  Monico Blitz, PA-C 09/26/15 0020  Leonard Schwartz, MD 09/26/15 [phone]

## 2015-09-25 NOTE — Progress Notes (Signed)
EDCM attempted to speak to patient at bedside, however patient currently in testing.  Patient recently admitted and discharged from hospital from 11/08 to 11/11 with hyperosmolar nonketotic state.  Patient with 5 admissions within the last six months.  Last admission patient was discharged to [facility].  Prior to going to SNF he was living with family.  Chart review reveals patient has had [facility] for home health services in the past and that patient is noncompliant with his medication.  EDCM will place [facility] consult for frequent admissions and disease management, hopefully he will qualify.  No further EDCM needs at this time.

## 2015-09-25 NOTE — ED Notes (Signed)
Bed: [id number] Expected date:  Expected time:  Means of arrival:  Comments: T9

## 2015-09-25 NOTE — ED Notes (Signed)
Patient transported to CT 

## 2015-09-26 DIAGNOSIS — I10 Essential (primary) hypertension: Secondary | ICD-10-CM | POA: Diagnosis not present

## 2015-09-26 DIAGNOSIS — E1121 Type 2 diabetes mellitus with diabetic nephropathy: Secondary | ICD-10-CM | POA: Diagnosis not present

## 2015-09-26 DIAGNOSIS — E1165 Type 2 diabetes mellitus with hyperglycemia: Secondary | ICD-10-CM | POA: Diagnosis not present

## 2015-09-26 DIAGNOSIS — R739 Hyperglycemia, unspecified: Secondary | ICD-10-CM | POA: Diagnosis not present

## 2015-09-26 LAB — CBC
HCT: 26.8 % — ABNORMAL LOW (ref 39.0–52.0)
Hemoglobin: 9.1 g/dL — ABNORMAL LOW (ref 13.0–17.0)
MCH: 30.6 pg (ref 26.0–34.0)
MCHC: 34 g/dL (ref 30.0–36.0)
MCV: 90.2 fL (ref 78.0–100.0)
Platelets: 215 10*3/uL (ref 150–400)
RBC: 2.97 MIL/uL — ABNORMAL LOW (ref 4.22–5.81)
RDW: 13.9 % (ref 11.5–15.5)
WBC: 8.5 10*3/uL (ref 4.0–10.5)

## 2015-09-26 LAB — GLUCOSE, CAPILLARY
Glucose-Capillary: 274 mg/dL — ABNORMAL HIGH (ref 65–99)
Glucose-Capillary: 180 mg/dL — ABNORMAL HIGH (ref 65–99)
Glucose-Capillary: 197 mg/dL — ABNORMAL HIGH (ref 65–99)
Glucose-Capillary: 205 mg/dL — ABNORMAL HIGH (ref 65–99)
Glucose-Capillary: 227 mg/dL — ABNORMAL HIGH (ref 65–99)
Glucose-Capillary: 160 mg/dL — ABNORMAL HIGH (ref 65–99)
Glucose-Capillary: 144 mg/dL — ABNORMAL HIGH (ref 65–99)
Glucose-Capillary: 95 mg/dL (ref 65–99)
Glucose-Capillary: 80 mg/dL (ref 65–99)
Glucose-Capillary: 98 mg/dL (ref 65–99)
Glucose-Capillary: 236 mg/dL — ABNORMAL HIGH (ref 65–99)
Glucose-Capillary: 188 mg/dL — ABNORMAL HIGH (ref 65–99)
Glucose-Capillary: 236 mg/dL — ABNORMAL HIGH (ref 65–99)

## 2015-09-26 LAB — BASIC METABOLIC PANEL
Anion gap: 7 (ref 5–15)
BUN: 27 mg/dL — ABNORMAL HIGH (ref 6–20)
CO2: 25 mmol/L (ref 22–32)
Calcium: 8.6 mg/dL — ABNORMAL LOW (ref 8.9–10.3)
Chloride: 104 mmol/L (ref 101–111)
Creatinine, Ser: 0.77 mg/dL (ref 0.61–1.24)
GFR calc Af Amer: >60 mL/min (ref >60)
GFR calc non Af Amer: >60 mL/min (ref >60)
Glucose, Bld: 202 mg/dL — ABNORMAL HIGH (ref 65–99)
Potassium: 3.7 mmol/L (ref 3.5–5.1)
Sodium: 136 mmol/L (ref 135–145)

## 2015-09-26 LAB — CBG MONITORING, ED: Glucose-Capillary: 393 mg/dL — ABNORMAL HIGH (ref 65–99)

## 2015-09-26 MED ORDER — INSULIN ASPART 100 UNIT/ML ~~LOC~~ SOLN
3.0000 [IU] | Freq: Three times a day (TID) | SUBCUTANEOUS | Status: DC
  Administered 2015-09-26 – 2015-09-27 (×2): 3 [IU] via SUBCUTANEOUS

## 2015-09-26 MED ORDER — FOLIC ACID 1 MG PO TABS
1.0000 mg | ORAL_TABLET | Freq: Every day | ORAL | Status: DC
  Administered 2015-09-26 – 2015-09-27 (×2): 1 mg via ORAL
  Filled 2015-09-26 (×2): qty 1

## 2015-09-26 MED ORDER — INSULIN ASPART 100 UNIT/ML ~~LOC~~ SOLN
0.0000 [IU] | Freq: Three times a day (TID) | SUBCUTANEOUS | Status: DC
  Administered 2015-09-26 – 2015-09-27 (×2): 5 [IU] via SUBCUTANEOUS

## 2015-09-26 MED ORDER — INSULIN ASPART 100 UNIT/ML ~~LOC~~ SOLN
0.0000 [IU] | Freq: Every day | SUBCUTANEOUS | Status: DC
  Administered 2015-09-26: 2 [IU] via SUBCUTANEOUS

## 2015-09-26 MED ORDER — ATORVASTATIN CALCIUM 10 MG PO TABS
10.0000 mg | ORAL_TABLET | Freq: Every day | ORAL | Status: DC
  Administered 2015-09-26: 10 mg via ORAL
  Filled 2015-09-26 (×2): qty 1

## 2015-09-26 MED ORDER — ACETAMINOPHEN 325 MG PO TABS
650.0000 mg | ORAL_TABLET | Freq: Four times a day (QID) | ORAL | Status: DC | PRN
  Administered 2015-09-26 – 2015-09-27 (×3): 650 mg via ORAL
  Filled 2015-09-26 (×3): qty 2

## 2015-09-26 MED ORDER — GLUCERNA SHAKE PO LIQD
237.0000 mL | Freq: Two times a day (BID) | ORAL | Status: DC
  Administered 2015-09-27: 237 mL via ORAL
  Filled 2015-09-26 (×4): qty 237

## 2015-09-26 MED ORDER — FLUOXETINE HCL 20 MG PO CAPS
20.0000 mg | ORAL_CAPSULE | Freq: Every day | ORAL | Status: DC
  Administered 2015-09-26 – 2015-09-27 (×2): 20 mg via ORAL
  Filled 2015-09-26 (×2): qty 1

## 2015-09-26 MED ORDER — SODIUM CHLORIDE 0.9 % IV SOLN
INTRAVENOUS | Status: DC
  Administered 2015-09-26: 03:00:00 via INTRAVENOUS

## 2015-09-26 MED ORDER — INSULIN GLARGINE 100 UNIT/ML ~~LOC~~ SOLN
10.0000 [IU] | Freq: Every day | SUBCUTANEOUS | Status: DC
  Administered 2015-09-26: 10 [IU] via SUBCUTANEOUS
  Filled 2015-09-26: qty 0.1

## 2015-09-26 MED ORDER — AMLODIPINE BESYLATE 10 MG PO TABS
10.0000 mg | ORAL_TABLET | Freq: Every day | ORAL | Status: DC
  Administered 2015-09-26 – 2015-09-27 (×2): 10 mg via ORAL
  Filled 2015-09-26 (×2): qty 1

## 2015-09-26 MED ORDER — CLOPIDOGREL BISULFATE 75 MG PO TABS
75.0000 mg | ORAL_TABLET | Freq: Every day | ORAL | Status: DC
  Administered 2015-09-26 – 2015-09-27 (×2): 75 mg via ORAL
  Filled 2015-09-26 (×2): qty 1

## 2015-09-26 MED ORDER — INSULIN REGULAR BOLUS VIA INFUSION
0.0000 [IU] | Freq: Three times a day (TID) | INTRAVENOUS | Status: DC
  Filled 2015-09-26: qty 10

## 2015-09-26 MED ORDER — DEXTROSE 50 % IV SOLN: 25.0000 mL | INTRAVENOUS | Status: DC | PRN

## 2015-09-26 MED ORDER — INSULIN ASPART 100 UNIT/ML ~~LOC~~ SOLN: 3.0000 [IU] | Freq: Three times a day (TID) | SUBCUTANEOUS | Status: DC

## 2015-09-26 MED ORDER — INSULIN ASPART PROT & ASPART (70-30 MIX) 100 UNIT/ML ~~LOC~~ SUSP
15.0000 [IU] | Freq: Two times a day (BID) | SUBCUTANEOUS | Status: DC
  Administered 2015-09-26 – 2015-09-27 (×2): 15 [IU] via SUBCUTANEOUS
  Filled 2015-09-26: qty 10

## 2015-09-26 MED ORDER — DEXTROSE-NACL 5-0.45 % IV SOLN
INTRAVENOUS | Status: DC
  Administered 2015-09-26: 04:00:00 via INTRAVENOUS

## 2015-09-26 MED ORDER — HEPARIN SODIUM (PORCINE) 5000 UNIT/ML IJ SOLN
5000.0000 [IU] | Freq: Three times a day (TID) | INTRAMUSCULAR | Status: DC
Start: 2015-09-26 — End: 2015-09-27
  Administered 2015-09-26 – 2015-09-27 (×3): 5000 [IU] via SUBCUTANEOUS
  Filled 2015-09-26 (×7): qty 1

## 2015-09-26 MED ORDER — ASPIRIN EC 325 MG PO TBEC
325.0000 mg | DELAYED_RELEASE_TABLET | Freq: Every day | ORAL | Status: DC
  Administered 2015-09-26 – 2015-09-27 (×2): 325 mg via ORAL
  Filled 2015-09-26 (×2): qty 1

## 2015-09-26 NOTE — Progress Notes (Signed)
Patient arrived to unit via Nurse Tech with insulin drip infusion at 3.76ml/hour.

## 2015-09-26 NOTE — Progress Notes (Signed)
[facility] OF CARE NOTE  Patient: [name] [name]   [id number]  PCP: Mauricio Po, [name]   DOB: [birth year]  DOA: 09/25/2015   DOS: 09/26/2015   Plan of care: Patient has been seen by my colleague Dr gardner, Patient takes Lantus at home and wants to be switched to [facility]. Last A1C was 13. Diabetic educator has reccommended to adjust his insulin regimen. Will follow recommendation.   Author: Berle Mull, MD [facility] Pager: [phone] 09/26/2015 6:17 PM   If 7PM-7AM, please contact night-coverage at [email or web address], password [email or web address]

## 2015-09-26 NOTE — H&P (Signed)
[facility] History and Physical  [name] [name] XTK:[id number] DOB: 07-23-1946 DOA: 09/25/2015  Referring physician: EDP PCP: Mauricio Po, [name]   Chief Complaint: Hyperglycemia   HPI: [name] [name] is a 70 y.o. male with DM, frequently admitted to hospital for non-compliance with insulin causing his BGL to be high.  Presents to ED with polyuria, fatigue.  Found to have BGL over 800.  SW / CM are involved.  Review of Systems: Systems reviewed.  As above, otherwise negative  Past Medical History  Diagnosis Date  . Diabetes mellitus without complication ([place])   . Hypertension   . Hernia   . Back pain   . Seizures ([place])   . Diabetic neuropathy ([place])   . Stroke [facility])    Past Surgical History  Procedure Laterality Date  . Hip arthroplasty Right 02/05/2013    Procedure: ARTHROPLASTY BIPOLAR HIP;  Surgeon: [name], MD;  Location: [facility];  Service: Orthopedics;  Laterality: Right;   Social History:  reports that he quit smoking about 2 years ago. His smoking use included Cigarettes. He has a 7.5 pack-year smoking history. He has never used smokeless tobacco. He reports that he does not drink alcohol or use illicit drugs.  No Known Allergies  Family History  Problem Relation Age of Onset  . Diabetes Mother   . Alzheimer's disease Mother   . Hypertension Mother   . Hyperlipidemia Father   . Hypertension Father   . Healthy Maternal Grandmother   . Pneumonia Maternal Grandfather      Prior to Admission medications   Medication Sig Start Date End Date Taking? Authorizing Provider  amLODipine (NORVASC) 10 MG tablet Take 1 tablet (10 mg total) by mouth daily. 08/14/15  Yes Robbie Lis, MD  clopidogrel (PLAVIX) 75 MG tablet Take 75 mg by mouth daily.   Yes Historical Provider, MD  insulin aspart protamine- aspart (NOVOLOG MIX 70/30) (70-30) 100 UNIT/ML injection Inject into the skin.   Yes Historical Provider, MD  acetaminophen (TYLENOL) 325 MG tablet Take 2 tablets (650  mg total) by mouth every 6 (six) hours as needed for mild pain (or Fever >/= 101). 08/14/15   Robbie Lis, MD  alprazolam [name]) 2 MG tablet Take 0.5 tablets (1 mg total) by mouth 2 (two) times daily as needed for anxiety. 08/24/15   Verlee Monte, MD  aspirin EC 325 MG tablet Take 1 tablet (325 mg total) by mouth daily. 06/08/15   Rosalin Hawking, MD  atorvastatin (LIPITOR) 10 MG tablet Take 1 tablet (10 mg total) by mouth daily at 6 PM. Patient taking differently: Take 10 mg by mouth daily.  03/23/15   Mechele Claude, DO  blood glucose meter kit and supplies Dispense based on patient and insurance preference. Use up to four times daily as directed. (FOR ICD-9 250.00, 250.01). 07/04/15   Donita Brooks, NP  feeding supplement, GLUCERNA SHAKE, (GLUCERNA SHAKE) LIQD Take 237 mLs by mouth 2 (two) times daily between meals. 08/14/15   Robbie Lis, MD  FLUoxetine (PROZAC) 20 MG capsule Take 20 mg by mouth daily.    Historical Provider, MD  folic acid (FOLVITE) 1 MG tablet Take 1 mg by mouth daily.    Historical Provider, MD  insulin aspart (NOVOLOG) 100 UNIT/ML injection Inject 3 Units into the skin 3 (three) times daily with meals. 08/24/15   Verlee Monte, MD  insulin glargine (LANTUS) 100 UNIT/ML injection Inject 0.1 mLs (10 Units total) into the skin daily. 08/24/15  Verlee Monte, MD  PRESCRIPTION MEDICATION Place 1 Dose into the right eye every 30 (thirty) days. Eye injection    Historical Provider, MD   Physical Exam: Filed Vitals:   09/25/15 2316 09/25/15 2337  BP:  126/69  Pulse: 72 75  Temp:  99.7 F (37.6 C)  Resp: 24 20    BP 126/69 mmHg  Pulse 75  Temp(Src) 99.7 F (37.6 C) (Rectal)  Resp 20  SpO2 95%  General Appearance:    Alert, oriented, no distress, appears stated age  Head:    Normocephalic, atraumatic  Eyes:    PERRL, EOMI, sclera non-icteric        Nose:   Nares without drainage or epistaxis. Mucosa, turbinates normal  Throat:   Moist mucous membranes. Oropharynx  without erythema or exudate.  Neck:   Supple. No carotid bruits.  No thyromegaly.  No lymphadenopathy.   Back:     No CVA tenderness, no spinal tenderness  Lungs:     Clear to auscultation bilaterally, without wheezes, rhonchi or rales  Chest wall:    No tenderness to palpitation  Heart:    Regular rate and rhythm without murmurs, gallops, rubs  Abdomen:     Soft, non-tender, nondistended, normal bowel sounds, no organomegaly  Genitalia:    deferred  Rectal:    deferred  Extremities:   No clubbing, cyanosis or edema.  Pulses:   2+ and symmetric all extremities  Skin:   Skin color, texture, turgor normal, no rashes or lesions  Lymph nodes:   Cervical, supraclavicular, and axillary nodes normal  Neurologic:   CNII-XII intact. Normal strength, sensation and reflexes      throughout    Labs on Admission:  Basic Metabolic Panel:  Recent Labs Lab 09/25/15 2118  NA 123*  K 4.5  CL 89*  CO2 21*  GLUCOSE 898*  BUN 41*  CREATININE 1.34*  CALCIUM 8.9   Liver Function Tests: No results for input(s): AST, ALT, ALKPHOS, BILITOT, PROT, ALBUMIN in the last 168 hours. No results for input(s): LIPASE, AMYLASE in the last 168 hours. No results for input(s): AMMONIA in the last 168 hours. CBC:  Recent Labs Lab 09/25/15 2118  WBC 9.2  HGB 10.5*  HCT 34.0*  MCV 97.1  PLT 240   Cardiac Enzymes: No results for input(s): CKTOTAL, CKMB, CKMBINDEX, TROPONINI in the last 168 hours.  BNP (last 3 results) No results for input(s): PROBNP in the last 8760 hours. CBG:  Recent Labs Lab 09/25/15 2053 09/25/15 2122 09/25/15 2341  GLUCAP >600* >600* >600*    Radiological Exams on Admission: Ct Head Wo Contrast  09/25/2015  CLINICAL DATA:  High blood pressure. Dizziness. Confusion. Diabetes. Prior stroke. EXAM: CT HEAD WITHOUT CONTRAST TECHNIQUE: Contiguous axial images were obtained from the base of the skull through the vertex without intravenous contrast. COMPARISON:  08/07/2015  FINDINGS: Sinuses/Soft tissues: Mucosal thickening of the right sphenoid sinus. Cerumen in both external ear canals. Clear mastoid air cells. Intracranial: Mild to moderate low density in the periventricular white matter likely related to small vessel disease. No mass lesion, hemorrhage, hydrocephalus, acute infarct, intra-axial, or extra-axial fluid collection. IMPRESSION: 1.  No acute intracranial abnormality. 2. Small vessel ischemic change in the periventricular white matter. 3. Minimal sinus disease. Electronically Signed   By: Abigail Miyamoto M.D.   On: 09/25/2015 22:13    EKG: Independently reviewed.  Assessment/Plan Principal Problem:   Hyperglycemia Active Problems:   Uncontrolled type 2 diabetes mellitus with diabetic nephropathy,  with long-term current use of insulin ([name])   Benign essential HTN   1. Hyperglycemia associated with DM2 - likely associated with non-compliance given his extensive history of same. 1. Insulin gtt 2. Repeat BMP in AM (5 hours from now) 3. NS for dehydration 4. Normal anion gap 2. HTN - continue home meds    Code Status: Full  Family Communication: No family in room Disposition Plan: Admit to obs   Time spent: 50 min  Braxtin Bamba M. [facility] Pager [phone]  If 7AM-7PM, please contact the day team taking care of the patient [email or web address] Password [email or web address] 09/26/2015, 12:13 AM

## 2015-09-26 NOTE — Progress Notes (Signed)
Inpatient Diabetes Program Recommendations  AACE/ADA: New Consensus Statement on Inpatient Glycemic Control (2015)  Target Ranges:  Prepandial:   less than 140 mg/dL      Peak postprandial:   less than 180 mg/dL (1-2 hours)      Critically ill patients:  140 - 180 mg/dL   Review of Glycemic Control  Diabetes history: DM2 Outpatient Diabetes medications: Novolog 70/30 15 units in am and 10 units in pm Current orders for Inpatient glycemic control: IV insulin  Results for DVONTA, CALTRIDER (MRN [id number]) as of 09/26/2015 09:43  Ref. Range 09/26/2015 04:46 09/26/2015 05:44 09/26/2015 06:47 09/26/2015 07:54 09/26/2015 08:45  Glucose-Capillary Latest Ref Range: 65-99 mg/dL 197 (H) 205 (H) 227 (H) 160 (H) 144 (H)  Results for KAMARRION, SCHOESSOW (MRN [id number]) as of 09/26/2015 09:43  Ref. Range 08/08/2015 06:45  Hemoglobin A1C Latest Ref Range: 4.8-5.6 % 13.3 (H)  Results for JAECION, ALBINI (MRN [id number]) as of 09/26/2015 09:43  Ref. Range 09/25/2015 21:18 09/26/2015 04:45  Glucose Latest Ref Range: 65-99 mg/dL 898 (HH) 202 (H)   Pt states he checks his blood sugars 2x./day and usually runs in low 200s. States he sees MD every month. Does not take sugar log for adjustments. States MD does not adjust his insulin. Pt hungry. Hasn't eaten since yesterday morning. Will need insulin adjustment prior to discharge. Questionable whether pt really takes his insulin. States he hasn't had a low in 2 years. Ready for transition to SQ.  Inpatient Diabetes Program Recommendations:    Transition to 70/30 15 units bid. Novolog sensitive tidwc and hs Diet - CHO mod med   Needs f/u appt for diabetes management. Will follow closely today.  Thank you. Lorenda Peck, RD, LDN, CDE Inpatient Diabetes Coordinator [phone]

## 2015-09-26 NOTE — Care Management Obs Status (Signed)
[place] NOTIFICATION   Patient Details  Name: [name] [name] MRN: [id number] Date of Birth: 04-16-1946   Medicare Observation Status Notification Given:  Yes Explained Observation Status,  given copy at bedside. No voiced questions.     Delrae Sawyers, RN 09/26/2015, 3:42 PM

## 2015-09-26 NOTE — Progress Notes (Signed)
Spoke with Posey Pronto, MD  who reports pt ready for discharge. Reviewed EDCM note dated 09/25/2015 which reflects [facility] referral. Pt has PCP as well as payer source. Unsure of reason pt has had 5 admissions within 6 months for DM  related incidents. Presented 09/25/2015 with Polyuria, fatigue and BGL >800. Clearly Noncompliant. Epic reflects OVs and Telephone contact with pt, as well as, ED visits and subsequent admissions to the hospital. [facility] notes [facility] visits. Will be available for assistance with [facility] referral.

## 2015-09-27 DIAGNOSIS — E1121 Type 2 diabetes mellitus with diabetic nephropathy: Secondary | ICD-10-CM | POA: Diagnosis not present

## 2015-09-27 DIAGNOSIS — I10 Essential (primary) hypertension: Secondary | ICD-10-CM

## 2015-09-27 DIAGNOSIS — Z794 Long term (current) use of insulin: Secondary | ICD-10-CM

## 2015-09-27 DIAGNOSIS — E1165 Type 2 diabetes mellitus with hyperglycemia: Secondary | ICD-10-CM | POA: Diagnosis not present

## 2015-09-27 DIAGNOSIS — R739 Hyperglycemia, unspecified: Secondary | ICD-10-CM

## 2015-09-27 LAB — COMPREHENSIVE METABOLIC PANEL
ALT: 19 U/L (ref 17–63)
AST: 27 U/L (ref 15–41)
Albumin: 3.2 g/dL — ABNORMAL LOW (ref 3.5–5.0)
Alkaline Phosphatase: 100 U/L (ref 38–126)
Anion gap: 9 (ref 5–15)
BUN: 13 mg/dL (ref 6–20)
CO2: 27 mmol/L (ref 22–32)
Calcium: 8.9 mg/dL (ref 8.9–10.3)
Chloride: 105 mmol/L (ref 101–111)
Creatinine, Ser: 0.73 mg/dL (ref 0.61–1.24)
GFR calc Af Amer: >60 mL/min (ref >60)
GFR calc non Af Amer: >60 mL/min (ref >60)
Glucose, Bld: 121 mg/dL — ABNORMAL HIGH (ref 65–99)
Potassium: 3.6 mmol/L (ref 3.5–5.1)
Sodium: 141 mmol/L (ref 135–145)
Total Bilirubin: 0.4 mg/dL (ref 0.3–1.2)
Total Protein: 6.3 g/dL — ABNORMAL LOW (ref 6.5–8.1)

## 2015-09-27 LAB — CBC WITH DIFFERENTIAL/PLATELET
Basophils Absolute: 0 10*3/uL (ref 0.0–0.1)
Basophils Relative: 0 %
Eosinophils Absolute: 0.4 10*3/uL (ref 0.0–0.7)
Eosinophils Relative: 6 %
HCT: 28.3 % — ABNORMAL LOW (ref 39.0–52.0)
Hemoglobin: 9.6 g/dL — ABNORMAL LOW (ref 13.0–17.0)
Lymphocytes Relative: 32 %
Lymphs Abs: 2.2 10*3/uL (ref 0.7–4.0)
MCH: 31.1 pg (ref 26.0–34.0)
MCHC: 33.9 g/dL (ref 30.0–36.0)
MCV: 91.6 fL (ref 78.0–100.0)
Monocytes Absolute: 0.7 10*3/uL (ref 0.1–1.0)
Monocytes Relative: 10 %
Neutro Abs: 3.6 10*3/uL (ref 1.7–7.7)
Neutrophils Relative %: 52 %
Platelets: 251 10*3/uL (ref 150–400)
RBC: 3.09 MIL/uL — ABNORMAL LOW (ref 4.22–5.81)
RDW: 14.3 % (ref 11.5–15.5)
WBC: 6.9 10*3/uL (ref 4.0–10.5)

## 2015-09-27 LAB — PROTIME-INR
INR: 0.98 (ref 0.00–1.49)
Prothrombin Time: 13.2 seconds (ref 11.6–15.2)

## 2015-09-27 LAB — GLUCOSE, CAPILLARY: Glucose-Capillary: 225 mg/dL — ABNORMAL HIGH (ref 65–99)

## 2015-09-27 MED ORDER — INSULIN ASPART 100 UNIT/ML ~~LOC~~ SOLN: 3.0000 [IU] | Freq: Three times a day (TID) | SUBCUTANEOUS | Status: DC

## 2015-09-27 MED ORDER — INSULIN ASPART PROT & ASPART (70-30 MIX) 100 UNIT/ML ~~LOC~~ SUSP: 22.0000 [IU] | Freq: Two times a day (BID) | SUBCUTANEOUS | Status: DC

## 2015-09-27 NOTE — Progress Notes (Signed)
Inpatient Diabetes Program Recommendations  AACE/ADA: New Consensus Statement on Inpatient Glycemic Control (2015)  Target Ranges:  Prepandial:   less than 140 mg/dL      Peak postprandial:   less than 180 mg/dL (1-2 hours)      Critically ill patients:  140 - 180 mg/dL   Review of Glycemic Control  Diabetes history: DM 2 Current orders for Inpatient glycemic control: 70/30 15 units BID, Novolog Moderate + HS scale + Novolog 3 units TID meal coverage  Inpatient Diabetes Program Recommendations: Insulin - Basal: Patient received a total amount of 20 units of basal yesterday together with the Lantus and 70/30. Fasting glucose this am was 225 mg/dl. Please consider increasing 70/30 dose to 18 units BID. This will increase total daily basal to 25 units.  Thanks,  Tama Headings RN, MSN, [facility] Inpatient Diabetes Coordinator Team Pager [phone] (8a-5p)

## 2015-09-28 ENCOUNTER — Telehealth: Payer: Self-pay | Admitting: *Deleted

## 2015-09-28 NOTE — Telephone Encounter (Signed)
Transition Care Management Follow-up Telephone Call   Date discharged? 09/27/15   How have you been since you were released from the hospital? Pt states he feel pretty good   Do you understand why you were in the hospital? YES   Do you understand the discharge instructions? YES   Where were you discharged to? Home   Items Reviewed:  Medications reviewed: YES  Allergies reviewed: YES  Dietary changes reviewed: YES, diabetic & carb modified diet  Referrals reviewed: NO   Functional Questionnaire:   Activities of Daily Living (ADLs):   He states he are independent in the following: ambulation, bathing and hygiene, feeding, continence, grooming, toileting and dressing States he doesn't require assistance   Any transportation issues/concerns?: NO   Any patient concerns? NO   Confirmed importance and date/time of follow-up visits scheduled YES. appt 10/03/15  Provider Appointment booked with Terri Piedra, NP  Confirmed with patient if condition begins to worsen call PCP or go to the ER.  Patient was given the office number and encouraged to call back with question or concerns.  : YES

## 2015-09-28 NOTE — Discharge Summary (Signed)
[facility] Discharge Summary   Patient: [name] [name]    [id number] PCP: Mauricio Po, [name]   DOB: 21-Oct-1945 Date of admission: 09/25/2015  Date of discharge: 09/27/2015   Discharge Diagnoses:  Principal Problem:   Hyperglycemia Active Problems:   Uncontrolled type 2 diabetes mellitus with diabetic nephropathy, with long-term current use of insulin ([name])   Benign essential HTN   Recommendations for Outpatient Follow-up:  1. Follow up with PCP as scheduled.   Diet recommendation: carb modified diet  Activity: The patient is advised to gradually reintroduce usual activities.  Discharge Condition: good  History of present illness: As per the H and P dictated on admission, "[name] [name] is a 69 y.o. male with DM, frequently admitted to hospital for non-compliance with insulin causing his BGL to be high. Presents to ED with polyuria, fatigue. Found to have BGL over 800. SW / CM are involved."  Hospital Course:  Summary of his active problems in the hospital is as following. Principal Problem:   Hyperglycemia   Uncontrolled type 2 diabetes mellitus with diabetic nephropathy, with long-term current use of insulin ([place])  The pt presented with severe hyperglycemia, not having DKA. He mentioned that he might have slept more and might not have taken his insulin. Patient has prior h/o recurrent admission for hyperglycemia. At present as per the discussion with diabetic educator the patient wants to be switched to insulin 70/30 and due to that the patient was switched to 70/30 and discharged on 22 units bid. He has been on as high as 30 units bid in past. Requested the patient to continue following up with his PCP for further adjustment of his sugars. And also informed about the dangers of uncontrolled diabetes and he verbalized understanding.  Chronic pain: The patient has been on oxycodone scheduled chronically. It was resumed on discharged, but patient did not required any  pain medication in the hospital.    Benign essential HTN Blood pressure remained well controlled.  Acute encephalopathy Mostly due to hyperglycemia Improved with hydration and insulin.   All other chronic medical condition were stable during the hospitalization.  Patient was ambulatory without any assistance. On the day of the discharge the patient's blood sugars were adequately controlled, and no other acute medical condition were reported by patient. the patient was felt safe to be discharge at home with family suport.  Procedures and Results:  none   Consultations:  none  Discharge Exam: Filed Weights   09/26/15 0230  Weight: 70.308 kg (155 lb)   Filed Vitals:   09/27/15 0519 09/27/15 0956  BP: 143/80 152/80  Pulse: 60   Temp: 98.7 F (37.1 C)   Resp: 15    General: Appear in no distress, no Rash; Oral Mucosa moist. Cardiovascular: S1 and S2 Present, no Murmur, no JVD Respiratory: Bilateral Air entry present and Clear to Auscultation, no Crackles, no wheezes Abdomen: Bowel Sound present, Soft and no tenderness Extremities: no Pedal edema, o calf tenderness Neurology: Grossly no focal neuro deficit.  DISCHARGE MEDICATION: Discharge Instructions    Diet - low sodium heart healthy    Complete by:  As directed      Diet Carb Modified    Complete by:  As directed      Increase activity slowly    Complete by:  As directed           Discharge Medication List as of 09/27/2015 11:28 AM    CONTINUE these medications which have CHANGED   Details  insulin aspart (NOVOLOG) 100 UNIT/ML injection Inject 3 Units into the skin 3 (three) times daily with meals., Starting 09/27/2015, Until Discontinued, Print    insulin aspart protamine- aspart (NOVOLOG MIX 70/30) (70-30) 100 UNIT/ML injection Inject 0.22 mLs (22 Units total) into the skin 2 (two) times daily with a meal., Starting 09/27/2015, Until Discontinued, Print      CONTINUE these medications which have NOT  CHANGED   Details  acetaminophen (TYLENOL) 325 MG tablet Take 2 tablets (650 mg total) by mouth every 6 (six) hours as needed for mild pain (or Fever >/= 101)., Starting 08/14/2015, Until Discontinued, Print    alprazolam (XANAX) 2 MG tablet Take 0.5 tablets (1 mg total) by mouth 2 (two) times daily as needed for anxiety., Starting 08/24/2015, Until Discontinued, Print    amLODipine (NORVASC) 10 MG tablet Take 1 tablet (10 mg total) by mouth daily., Starting 08/14/2015, Until Discontinued, Print    aspirin EC 325 MG tablet Take 1 tablet (325 mg total) by mouth daily., Starting 06/08/2015, Until Discontinued, Normal    atorvastatin (LIPITOR) 10 MG tablet Take 1 tablet (10 mg total) by mouth daily at 6 PM., Starting 03/23/2015, Until Discontinued, Print    clopidogrel (PLAVIX) 75 MG tablet Take 75 mg by mouth daily., Until Discontinued, Historical Med    feeding supplement, GLUCERNA SHAKE, (GLUCERNA SHAKE) LIQD Take 237 mLs by mouth 2 (two) times daily between meals., Starting 08/14/2015, Until Discontinued, Normal    FLUoxetine (PROZAC) 20 MG capsule Take 20 mg by mouth daily., Until Discontinued, Historical Med    folic acid (FOLVITE) 1 MG tablet Take 1 mg by mouth daily., Until Discontinued, Historical Med    Oxycodone HCl 20 MG TABS Take 20 mg by mouth 4 (four) times daily., Until Discontinued, Historical Med    blood glucose meter kit and supplies Dispense based on patient and insurance preference. Use up to four times daily as directed. (FOR ICD-9 250.00, 250.01)., Normal    PRESCRIPTION MEDICATION Place 1 Dose into the right eye every 30 (thirty) days. Eye injection, Until Discontinued, Historical Med      STOP taking these medications     insulin glargine (LANTUS) 100 UNIT/ML injection        No Known Allergies Follow-up Information    Follow up with Mauricio Po, [name] In 1 week.   Specialty:  Family Medicine   Contact information:   [place]  [phone]       The results of significant diagnostics from this hospitalization (including imaging, microbiology, ancillary and laboratory) are listed below for reference.    Significant Diagnostic Studies: Ct Head Wo Contrast  09/25/2015  CLINICAL DATA:  High blood pressure. Dizziness. Confusion. Diabetes. Prior stroke. EXAM: CT HEAD WITHOUT CONTRAST TECHNIQUE: Contiguous axial images were obtained from the base of the skull through the vertex without intravenous contrast. COMPARISON:  08/07/2015 FINDINGS: Sinuses/Soft tissues: Mucosal thickening of the right sphenoid sinus. Cerumen in both external ear canals. Clear mastoid air cells. Intracranial: Mild to moderate low density in the periventricular white matter likely related to small vessel disease. No mass lesion, hemorrhage, hydrocephalus, acute infarct, intra-axial, or extra-axial fluid collection. IMPRESSION: 1.  No acute intracranial abnormality. 2. Small vessel ischemic change in the periventricular white matter. 3. Minimal sinus disease. Electronically Signed   By: Abigail Miyamoto M.D.   On: 09/25/2015 22:13    Microbiology: No results found for this or any previous visit (from the past 240 hour(s)).  Labs: CBC:  Recent Labs Lab 09/25/15 2118 09/26/15 0445 09/27/15 0520  WBC 9.2 8.5 6.9  NEUTROABS  --   --  3.6  HGB 10.5* 9.1* 9.6*  HCT 34.0* 26.8* 28.3*  MCV 97.1 90.2 91.6  PLT 240 215 700   Basic Metabolic Panel:  Recent Labs Lab 09/25/15 2118 09/26/15 0445 09/27/15 0520  NA 123* 136 141  K 4.5 3.7 3.6  CL 89* 104 105  CO2 21* 25 27  GLUCOSE 898* 202* 121*  BUN 41* 27* 13  CREATININE 1.34* 0.77 0.73  CALCIUM 8.9 8.6* 8.9   Liver Function Tests:  Recent Labs Lab 09/27/15 0520  AST 27  ALT 19  ALKPHOS 100  BILITOT 0.4  PROT 6.3*  ALBUMIN 3.2*   CBG:  Recent Labs Lab 09/26/15 1141 09/26/15 1328 09/26/15 1700 09/26/15 2123 09/27/15 0716  GLUCAP 98 236* 188* 236* 225*    Time  spent: 30 minutes  Signed:  Amri Lien  [facility] 09/27/2015, 2:15 PM

## 2015-10-03 ENCOUNTER — Inpatient Hospital Stay: Payer: Medicare HMO | Admitting: Family

## 2015-10-10 ENCOUNTER — Telehealth: Payer: Self-pay | Admitting: Family

## 2015-10-10 NOTE — Telephone Encounter (Signed)
Patient has not took latus for over a month because he does not have any needles.  He also does not have the lancets, the device that pulls the lancets out or the needles that screw on to the lancet pens.   Is trying to figure out who prescribes this.

## 2015-10-11 ENCOUNTER — Other Ambulatory Visit: Payer: Self-pay

## 2015-10-11 MED ORDER — INSULIN PEN NEEDLE 31G X 5 MM MISC: 1.0000 | Freq: Three times a day (TID) | Status: DC

## 2015-10-11 NOTE — Telephone Encounter (Signed)
Sent in needles. Pts sister is aware.

## 2015-10-16 ENCOUNTER — Other Ambulatory Visit: Payer: Self-pay

## 2015-10-16 MED ORDER — LANCETS MISC: Status: DC

## 2015-10-16 MED ORDER — BLOOD GLUCOSE METER KIT: PACK | Status: DC

## 2015-10-16 MED ORDER — GLUCOSE BLOOD VI STRP: ORAL_STRIP | Status: DC

## 2015-11-01 ENCOUNTER — Inpatient Hospital Stay (HOSPITAL_COMMUNITY)
Admission: EM | Admit: 2015-11-01 | Discharge: 2015-11-03 | DRG: 917 | Disposition: A | Discharge status: Home / Self Care | Payer: Medicare Other | Attending: Internal Medicine | Admitting: Internal Medicine

## 2015-11-01 ENCOUNTER — Encounter (HOSPITAL_COMMUNITY): Payer: Self-pay | Admitting: Emergency Medicine

## 2015-11-01 ENCOUNTER — Emergency Department (HOSPITAL_COMMUNITY): Payer: Medicare Other

## 2015-11-01 DIAGNOSIS — Z79899 Other long term (current) drug therapy: Secondary | ICD-10-CM

## 2015-11-01 DIAGNOSIS — E785 Hyperlipidemia, unspecified: Secondary | ICD-10-CM | POA: Diagnosis present

## 2015-11-01 DIAGNOSIS — F329 Major depressive disorder, single episode, unspecified: Secondary | ICD-10-CM | POA: Diagnosis present

## 2015-11-01 DIAGNOSIS — F1120 Opioid dependence, uncomplicated: Secondary | ICD-10-CM | POA: Diagnosis present

## 2015-11-01 DIAGNOSIS — Z794 Long term (current) use of insulin: Secondary | ICD-10-CM | POA: Diagnosis not present

## 2015-11-01 DIAGNOSIS — T402X1A Poisoning by other opioids, accidental (unintentional), initial encounter: Secondary | ICD-10-CM | POA: Diagnosis present

## 2015-11-01 DIAGNOSIS — J9811 Atelectasis: Secondary | ICD-10-CM | POA: Diagnosis present

## 2015-11-01 DIAGNOSIS — IMO0002 Reserved for concepts with insufficient information to code with codable children: Secondary | ICD-10-CM

## 2015-11-01 DIAGNOSIS — I63331 Cerebral infarction due to thrombosis of right posterior cerebral artery: Secondary | ICD-10-CM | POA: Diagnosis present

## 2015-11-01 DIAGNOSIS — G9340 Encephalopathy, unspecified: Secondary | ICD-10-CM

## 2015-11-01 DIAGNOSIS — N183 Chronic kidney disease, stage 3 unspecified: Secondary | ICD-10-CM | POA: Diagnosis present

## 2015-11-01 DIAGNOSIS — G9341 Metabolic encephalopathy: Secondary | ICD-10-CM | POA: Diagnosis present

## 2015-11-01 DIAGNOSIS — Z7902 Long term (current) use of antithrombotics/antiplatelets: Secondary | ICD-10-CM

## 2015-11-01 DIAGNOSIS — E11649 Type 2 diabetes mellitus with hypoglycemia without coma: Secondary | ICD-10-CM | POA: Diagnosis present

## 2015-11-01 DIAGNOSIS — Z79891 Long term (current) use of opiate analgesic: Secondary | ICD-10-CM

## 2015-11-01 DIAGNOSIS — Z96641 Presence of right artificial hip joint: Secondary | ICD-10-CM | POA: Diagnosis present

## 2015-11-01 DIAGNOSIS — I129 Hypertensive chronic kidney disease with stage 1 through stage 4 chronic kidney disease, or unspecified chronic kidney disease: Secondary | ICD-10-CM | POA: Diagnosis present

## 2015-11-01 DIAGNOSIS — R0602 Shortness of breath: Secondary | ICD-10-CM

## 2015-11-01 DIAGNOSIS — Z8673 Personal history of transient ischemic attack (TIA), and cerebral infarction without residual deficits: Secondary | ICD-10-CM | POA: Diagnosis not present

## 2015-11-01 DIAGNOSIS — Z87891 Personal history of nicotine dependence: Secondary | ICD-10-CM | POA: Diagnosis not present

## 2015-11-01 DIAGNOSIS — E1140 Type 2 diabetes mellitus with diabetic neuropathy, unspecified: Secondary | ICD-10-CM | POA: Diagnosis present

## 2015-11-01 DIAGNOSIS — Z833 Family history of diabetes mellitus: Secondary | ICD-10-CM | POA: Diagnosis not present

## 2015-11-01 DIAGNOSIS — E1121 Type 2 diabetes mellitus with diabetic nephropathy: Secondary | ICD-10-CM | POA: Diagnosis present

## 2015-11-01 DIAGNOSIS — T424X1A Poisoning by benzodiazepines, accidental (unintentional), initial encounter: Secondary | ICD-10-CM | POA: Diagnosis present

## 2015-11-01 DIAGNOSIS — Z820 Family history of epilepsy and other diseases of the nervous system: Secondary | ICD-10-CM

## 2015-11-01 DIAGNOSIS — D631 Anemia in chronic kidney disease: Secondary | ICD-10-CM | POA: Diagnosis present

## 2015-11-01 DIAGNOSIS — E1169 Type 2 diabetes mellitus with other specified complication: Secondary | ICD-10-CM

## 2015-11-01 DIAGNOSIS — E1165 Type 2 diabetes mellitus with hyperglycemia: Secondary | ICD-10-CM | POA: Diagnosis present

## 2015-11-01 DIAGNOSIS — E1122 Type 2 diabetes mellitus with diabetic chronic kidney disease: Secondary | ICD-10-CM | POA: Diagnosis present

## 2015-11-01 DIAGNOSIS — J690 Pneumonitis due to inhalation of food and vomit: Secondary | ICD-10-CM | POA: Diagnosis present

## 2015-11-01 DIAGNOSIS — Z8249 Family history of ischemic heart disease and other diseases of the circulatory system: Secondary | ICD-10-CM | POA: Diagnosis not present

## 2015-11-01 DIAGNOSIS — E15 Nondiabetic hypoglycemic coma: Secondary | ICD-10-CM | POA: Diagnosis present

## 2015-11-01 DIAGNOSIS — F32A Depression, unspecified: Secondary | ICD-10-CM | POA: Diagnosis present

## 2015-11-01 DIAGNOSIS — Z7982 Long term (current) use of aspirin: Secondary | ICD-10-CM | POA: Diagnosis not present

## 2015-11-01 DIAGNOSIS — I2510 Atherosclerotic heart disease of native coronary artery without angina pectoris: Secondary | ICD-10-CM | POA: Diagnosis present

## 2015-11-01 DIAGNOSIS — I10 Essential (primary) hypertension: Secondary | ICD-10-CM | POA: Diagnosis present

## 2015-11-01 DIAGNOSIS — D638 Anemia in other chronic diseases classified elsewhere: Secondary | ICD-10-CM | POA: Diagnosis not present

## 2015-11-01 DIAGNOSIS — R4182 Altered mental status, unspecified: Secondary | ICD-10-CM | POA: Diagnosis not present

## 2015-11-01 LAB — URINALYSIS, ROUTINE W REFLEX MICROSCOPIC
Bilirubin Urine: NEGATIVE
Glucose, UA: 100 mg/dL — AB
Hgb urine dipstick: NEGATIVE
Ketones, ur: NEGATIVE mg/dL
Leukocytes, UA: NEGATIVE
Nitrite: NEGATIVE
Protein, ur: 30 mg/dL — AB
Specific Gravity, Urine: 1.017 (ref 1.005–1.030)
pH: 5.5 (ref 5.0–8.0)

## 2015-11-01 LAB — COMPREHENSIVE METABOLIC PANEL
ALT: 13 U/L — ABNORMAL LOW (ref 17–63)
ALT: 14 U/L — ABNORMAL LOW (ref 17–63)
AST: 19 U/L (ref 15–41)
AST: 23 U/L (ref 15–41)
Albumin: 3.6 g/dL (ref 3.5–5.0)
Albumin: 3.8 g/dL (ref 3.5–5.0)
Alkaline Phosphatase: 69 U/L (ref 38–126)
Alkaline Phosphatase: 73 U/L (ref 38–126)
Anion gap: 7 (ref 5–15)
Anion gap: 10 (ref 5–15)
BUN: 19 mg/dL (ref 6–20)
BUN: 16 mg/dL (ref 6–20)
CO2: 25 mmol/L (ref 22–32)
CO2: 22 mmol/L (ref 22–32)
Calcium: 8.8 mg/dL — ABNORMAL LOW (ref 8.9–10.3)
Calcium: 9 mg/dL (ref 8.9–10.3)
Chloride: 112 mmol/L — ABNORMAL HIGH (ref 101–111)
Chloride: 113 mmol/L — ABNORMAL HIGH (ref 101–111)
Creatinine, Ser: 0.96 mg/dL (ref 0.61–1.24)
Creatinine, Ser: 0.85 mg/dL (ref 0.61–1.24)
GFR calc Af Amer: >60 mL/min (ref >60)
GFR calc Af Amer: >60 mL/min (ref >60)
GFR calc non Af Amer: >60 mL/min (ref >60)
GFR calc non Af Amer: >60 mL/min (ref >60)
Glucose, Bld: 24 mg/dL — CL (ref 65–99)
Glucose, Bld: 43 mg/dL — CL (ref 65–99)
Potassium: 3.9 mmol/L (ref 3.5–5.1)
Potassium: 3.8 mmol/L (ref 3.5–5.1)
Sodium: 144 mmol/L (ref 135–145)
Sodium: 145 mmol/L (ref 135–145)
Total Bilirubin: 0.7 mg/dL (ref 0.3–1.2)
Total Bilirubin: 0.8 mg/dL (ref 0.3–1.2)
Total Protein: 6.9 g/dL (ref 6.5–8.1)
Total Protein: 7.4 g/dL (ref 6.5–8.1)

## 2015-11-01 LAB — CBC WITH DIFFERENTIAL/PLATELET
Basophils Absolute: 0 10*3/uL (ref 0.0–0.1)
Basophils Absolute: 0 10*3/uL (ref 0.0–0.1)
Basophils Relative: 0 %
Basophils Relative: 0 %
Eosinophils Absolute: 0.3 10*3/uL (ref 0.0–0.7)
Eosinophils Absolute: 0.2 10*3/uL (ref 0.0–0.7)
Eosinophils Relative: 4 %
Eosinophils Relative: 4 %
HCT: 32 % — ABNORMAL LOW (ref 39.0–52.0)
HCT: 36.6 % — ABNORMAL LOW (ref 39.0–52.0)
Hemoglobin: 9.9 g/dL — ABNORMAL LOW (ref 13.0–17.0)
Hemoglobin: 11.4 g/dL — ABNORMAL LOW (ref 13.0–17.0)
Lymphocytes Relative: 45 %
Lymphocytes Relative: 29 %
Lymphs Abs: 2.8 10*3/uL (ref 0.7–4.0)
Lymphs Abs: 1.8 10*3/uL (ref 0.7–4.0)
MCH: 29.8 pg (ref 26.0–34.0)
MCH: 30.6 pg (ref 26.0–34.0)
MCHC: 30.9 g/dL (ref 30.0–36.0)
MCHC: 31.1 g/dL (ref 30.0–36.0)
MCV: 96.4 fL (ref 78.0–100.0)
MCV: 98.1 fL (ref 78.0–100.0)
Monocytes Absolute: 0.5 10*3/uL (ref 0.1–1.0)
Monocytes Absolute: 0.5 10*3/uL (ref 0.1–1.0)
Monocytes Relative: 9 %
Monocytes Relative: 8 %
Neutro Abs: 2.6 10*3/uL (ref 1.7–7.7)
Neutro Abs: 3.5 10*3/uL (ref 1.7–7.7)
Neutrophils Relative %: 42 %
Neutrophils Relative %: 59 %
Platelets: 255 10*3/uL (ref 150–400)
Platelets: 264 10*3/uL (ref 150–400)
RBC: 3.32 MIL/uL — ABNORMAL LOW (ref 4.22–5.81)
RBC: 3.73 MIL/uL — ABNORMAL LOW (ref 4.22–5.81)
RDW: 13.9 % (ref 11.5–15.5)
RDW: 13.9 % (ref 11.5–15.5)
WBC: 6.2 10*3/uL (ref 4.0–10.5)
WBC: 6 10*3/uL (ref 4.0–10.5)

## 2015-11-01 LAB — CBG MONITORING, ED
Glucose-Capillary: 112 mg/dL — ABNORMAL HIGH (ref 65–99)
Glucose-Capillary: 14 mg/dL — CL (ref 65–99)
Glucose-Capillary: 15 mg/dL — CL (ref 65–99)
Glucose-Capillary: 54 mg/dL — ABNORMAL LOW (ref 65–99)
Glucose-Capillary: 63 mg/dL — ABNORMAL LOW (ref 65–99)
Glucose-Capillary: 91 mg/dL (ref 65–99)

## 2015-11-01 LAB — GLUCOSE, CAPILLARY
Glucose-Capillary: 67 mg/dL (ref 65–99)
Glucose-Capillary: 80 mg/dL (ref 65–99)

## 2015-11-01 LAB — URINE MICROSCOPIC-ADD ON: Squamous Epithelial / LPF: NONE SEEN

## 2015-11-01 LAB — APTT: aPTT: 21 seconds — ABNORMAL LOW (ref 24–37)

## 2015-11-01 LAB — RAPID URINE DRUG SCREEN, HOSP PERFORMED
Amphetamines: NOT DETECTED
Barbiturates: NOT DETECTED
Benzodiazepines: POSITIVE — AB
Cocaine: NOT DETECTED
Opiates: POSITIVE — AB
Tetrahydrocannabinol: NOT DETECTED

## 2015-11-01 LAB — ACETAMINOPHEN LEVEL: Acetaminophen (Tylenol), Serum: <10 ug/mL — ABNORMAL LOW (ref 10–30)

## 2015-11-01 LAB — SALICYLATE LEVEL: Salicylate Lvl: <4 mg/dL (ref 2.8–30.0)

## 2015-11-01 LAB — PROTIME-INR
INR: 0.89 (ref 0.00–1.49)
Prothrombin Time: 12.3 seconds (ref 11.6–15.2)

## 2015-11-01 LAB — TSH: TSH: 0.509 u[IU]/mL (ref 0.350–4.500)

## 2015-11-01 LAB — ETHANOL: Alcohol, Ethyl (B): <5 mg/dL (ref <5)

## 2015-11-01 LAB — MAGNESIUM: Magnesium: 2.5 mg/dL — ABNORMAL HIGH (ref 1.7–2.4)

## 2015-11-01 LAB — PHOSPHORUS: Phosphorus: 3 mg/dL (ref 2.5–4.6)

## 2015-11-01 LAB — MRSA PCR SCREENING: MRSA by PCR: NEGATIVE

## 2015-11-01 MED ORDER — SODIUM CHLORIDE 0.9 % IV SOLN
INTRAVENOUS | Status: DC
  Administered 2015-11-01: 19:00:00 via INTRAVENOUS

## 2015-11-01 MED ORDER — ATORVASTATIN CALCIUM 10 MG PO TABS
10.0000 mg | ORAL_TABLET | Freq: Every evening | ORAL | Status: DC
  Administered 2015-11-02: 10 mg via ORAL
  Filled 2015-11-01 (×2): qty 1

## 2015-11-01 MED ORDER — DEXTROSE 5 % IV SOLN
0.5000 mg/h | INTRAVENOUS | Status: DC
  Administered 2015-11-01 (×2): 0.5 mg/h via INTRAVENOUS
  Filled 2015-11-01: qty 4

## 2015-11-01 MED ORDER — CLOPIDOGREL BISULFATE 75 MG PO TABS
75.0000 mg | ORAL_TABLET | Freq: Every day | ORAL | Status: DC
  Administered 2015-11-02 – 2015-11-03 (×2): 75 mg via ORAL
  Filled 2015-11-01 (×2): qty 1

## 2015-11-01 MED ORDER — FOLIC ACID 1 MG PO TABS
1.0000 mg | ORAL_TABLET | Freq: Every day | ORAL | Status: DC
  Administered 2015-11-02 – 2015-11-03 (×2): 1 mg via ORAL
  Filled 2015-11-01 (×2): qty 1

## 2015-11-01 MED ORDER — ONDANSETRON HCL 4 MG PO TABS: 4.0000 mg | ORAL_TABLET | Freq: Four times a day (QID) | ORAL | Status: DC | PRN

## 2015-11-01 MED ORDER — NALOXONE HCL 0.4 MG/ML IJ SOLN
0.4000 mg | Freq: Once | INTRAMUSCULAR | Status: AC
  Administered 2015-11-01: 0.4 mg via INTRAVENOUS
  Filled 2015-11-01: qty 1

## 2015-11-01 MED ORDER — HYDRALAZINE HCL 20 MG/ML IJ SOLN
5.0000 mg | Freq: Four times a day (QID) | INTRAMUSCULAR | Status: DC | PRN
  Administered 2015-11-01 – 2015-11-02 (×2): 5 mg via INTRAVENOUS
  Filled 2015-11-01 (×2): qty 1

## 2015-11-01 MED ORDER — DEXTROSE 50 % IV SOLN
1.0000 | Freq: Once | INTRAVENOUS | Status: AC
  Administered 2015-11-01: 50 mL via INTRAVENOUS
  Filled 2015-11-01: qty 50

## 2015-11-01 MED ORDER — DEXTROSE 10 % IV SOLN
INTRAVENOUS | Status: DC
  Administered 2015-11-01: 23:00:00 via INTRAVENOUS
  Filled 2015-11-01 (×3): qty 1000

## 2015-11-01 MED ORDER — DEXTROSE 50 % IV SOLN
1.0000 | Freq: Once | INTRAVENOUS | Status: AC
  Administered 2015-11-01: 50 mL via INTRAVENOUS

## 2015-11-01 MED ORDER — ACETAMINOPHEN 325 MG PO TABS: 650.0000 mg | ORAL_TABLET | Freq: Four times a day (QID) | ORAL | Status: DC | PRN

## 2015-11-01 MED ORDER — NALOXONE HCL 2 MG/2ML IJ SOSY
1.0000 mg | PREFILLED_SYRINGE | Freq: Once | INTRAMUSCULAR | Status: AC
  Administered 2015-11-01: 1 mg via INTRAVENOUS
  Filled 2015-11-01: qty 2

## 2015-11-01 MED ORDER — CETYLPYRIDINIUM CHLORIDE 0.05 % MT LIQD
7.0000 mL | Freq: Two times a day (BID) | OROMUCOSAL | Status: DC
  Administered 2015-11-02 – 2015-11-03 (×3): 7 mL via OROMUCOSAL

## 2015-11-01 MED ORDER — HYDRALAZINE HCL 20 MG/ML IJ SOLN
20.0000 mg | Freq: Four times a day (QID) | INTRAMUSCULAR | Status: DC
  Administered 2015-11-02 (×2): 20 mg via INTRAVENOUS
  Filled 2015-11-01 (×7): qty 1

## 2015-11-01 MED ORDER — THIAMINE HCL 100 MG/ML IJ SOLN
100.0000 mg | Freq: Every day | INTRAMUSCULAR | Status: DC
  Filled 2015-11-01: qty 1

## 2015-11-01 MED ORDER — DEXTROSE 50 % IV SOLN
INTRAVENOUS | Status: AC
  Administered 2015-11-01: 16:00:00
  Filled 2015-11-01: qty 50

## 2015-11-01 MED ORDER — SODIUM CHLORIDE 0.9 % IJ SOLN
3.0000 mL | Freq: Two times a day (BID) | INTRAMUSCULAR | Status: DC
  Administered 2015-11-01 – 2015-11-02 (×2): 3 mL via INTRAVENOUS

## 2015-11-01 MED ORDER — HYDRALAZINE HCL 20 MG/ML IJ SOLN
10.0000 mg | Freq: Once | INTRAMUSCULAR | Status: AC
  Administered 2015-11-01: 10 mg via INTRAVENOUS
  Filled 2015-11-01: qty 1

## 2015-11-01 MED ORDER — ACETAMINOPHEN 325 MG PO TABS
650.0000 mg | ORAL_TABLET | Freq: Four times a day (QID) | ORAL | Status: DC | PRN
  Administered 2015-11-02 – 2015-11-03 (×3): 650 mg via ORAL
  Filled 2015-11-01 (×3): qty 2

## 2015-11-01 MED ORDER — AMMONIA AROMATIC IN INHA
RESPIRATORY_TRACT | Status: AC
  Administered 2015-11-01: 15:00:00
  Filled 2015-11-01: qty 20

## 2015-11-01 MED ORDER — HYDRALAZINE HCL 20 MG/ML IJ SOLN
10.0000 mg | Freq: Four times a day (QID) | INTRAMUSCULAR | Status: DC
  Administered 2015-11-01 (×2): 10 mg via INTRAVENOUS
  Filled 2015-11-01 (×2): qty 1

## 2015-11-01 MED ORDER — AMLODIPINE BESYLATE 10 MG PO TABS
10.0000 mg | ORAL_TABLET | Freq: Every day | ORAL | Status: DC
  Administered 2015-11-02 – 2015-11-03 (×2): 10 mg via ORAL
  Filled 2015-11-01 (×2): qty 1

## 2015-11-01 MED ORDER — LORAZEPAM 1 MG PO TABS: 1.0000 mg | ORAL_TABLET | Freq: Four times a day (QID) | ORAL | Status: DC | PRN

## 2015-11-01 MED ORDER — DEXTROSE 50 % IV SOLN
INTRAVENOUS | Status: AC
  Filled 2015-11-01: qty 50

## 2015-11-01 MED ORDER — ACETAMINOPHEN 650 MG RE SUPP: 650.0000 mg | Freq: Four times a day (QID) | RECTAL | Status: DC | PRN

## 2015-11-01 MED ORDER — DEXTROSE 50 % IV SOLN
INTRAVENOUS | Status: AC
  Administered 2015-11-01: 50 mL
  Filled 2015-11-01: qty 50

## 2015-11-01 MED ORDER — LORAZEPAM 2 MG/ML IJ SOLN: 1.0000 mg | Freq: Four times a day (QID) | INTRAMUSCULAR | Status: DC | PRN

## 2015-11-01 MED ORDER — ADULT MULTIVITAMIN W/MINERALS CH
1.0000 | ORAL_TABLET | Freq: Every day | ORAL | Status: DC
  Administered 2015-11-02 – 2015-11-03 (×2): 1 via ORAL
  Filled 2015-11-01 (×2): qty 1

## 2015-11-01 MED ORDER — ASPIRIN EC 325 MG PO TBEC
325.0000 mg | DELAYED_RELEASE_TABLET | Freq: Every day | ORAL | Status: DC
  Administered 2015-11-02 – 2015-11-03 (×2): 325 mg via ORAL
  Filled 2015-11-01 (×2): qty 1

## 2015-11-01 MED ORDER — ONDANSETRON HCL 4 MG/2ML IJ SOLN: 4.0000 mg | Freq: Four times a day (QID) | INTRAMUSCULAR | Status: DC | PRN

## 2015-11-01 MED ORDER — DEXTROSE 10 % IV SOLN
INTRAVENOUS | Status: DC
  Administered 2015-11-01: 16:00:00 via INTRAVENOUS
  Filled 2015-11-01: qty 1000

## 2015-11-01 MED ORDER — VITAMIN B-1 100 MG PO TABS
100.0000 mg | ORAL_TABLET | Freq: Every day | ORAL | Status: DC
  Administered 2015-11-02 – 2015-11-03 (×2): 100 mg via ORAL
  Filled 2015-11-01 (×2): qty 1

## 2015-11-01 NOTE — Progress Notes (Signed)
Hypoglycemic Event  CBG: 43  Treatment: D50 IV 50 mL  Symptoms: Hungry  Follow-up CBG: Time:2310 CBG Result:67  Possible Reasons for Event: Inadequate meal intake and Medication regimen: patient took too much insulin before arrival to hospital  Comments/MD notified: T Rogue Bussing notified    [name] [name]

## 2015-11-01 NOTE — ED Notes (Signed)
Pt. Is unable to use the restroom at this time, but is aware that we need a urine specimen.  

## 2015-11-01 NOTE — ED Notes (Signed)
[name] [name] [phone]

## 2015-11-01 NOTE — ED Provider Notes (Signed)
CSN: [id number]     Arrival date & time 11/01/15  1316 History   First MD Initiated Contact with Patient 11/01/15 1328     Chief Complaint  Patient presents with  . Drug Overdose     (Consider location/radiation/quality/duration/timing/severity/associated sxs/prior Treatment) HPI  A LEVEL 5 CAVEAT PERTAINS DUE TO ALTERED MENTAL STATUS. Pt presenting due to altered mental status.  Per EMS and family he snorted xanax and may have taken oxycodone as well.  Family says he has a history of doing this with his prescribed meds.   Family states he went into his room and then they found him slumped over the bed and were unable to wake him up.    In discussion with family at bedside they state he has a history of "doing this and usually just sleeps it off"  They state he has chronic pain and depression and does take more of his meds than are prescribed and sometimes snorts his pills .  Pt states he did not want to commit suicide but wanted to feel high.    History reviewed. No pertinent past medical history. Past Surgical History  Procedure Laterality Date  . Hip arthroplasty Right 02/05/2013    Procedure: ARTHROPLASTY BIPOLAR HIP;  Surgeon: [name], MD;  Location: [facility];  Service: Orthopedics;  Laterality: Right;   Family History  Problem Relation Age of Onset  . Diabetes Mother   . Alzheimer's disease Mother   . Hypertension Mother   . Hyperlipidemia Father   . Hypertension Father   . Healthy Maternal Grandmother   . Pneumonia Maternal Grandfather    Social History  Substance Use Topics  . Smoking status: Former Smoker -- 0.25 packs/day for 30 years    Types: Cigarettes    Quit date: 01/31/2013  . Smokeless tobacco: Never Used  . Alcohol Use: No    Review of Systems  UNABLE TO OBTAIN ROS DUE TO LEVEL 5 CAVEAT    Allergies  Review of patient's allergies indicates no known allergies.  Home Medications   Prior to Admission medications   Medication Sig Start Date End  Date Taking? Authorizing Provider  acetaminophen (TYLENOL) 325 MG tablet Take 2 tablets (650 mg total) by mouth every 6 (six) hours as needed for mild pain (or Fever >/= 101). 08/14/15  Yes Robbie Lis, MD  alprazolam [name]) 2 MG tablet Take 0.5 tablets (1 mg total) by mouth 2 (two) times daily as needed for anxiety. 08/24/15  Yes Verlee Monte, MD  amLODipine (NORVASC) 10 MG tablet Take 1 tablet (10 mg total) by mouth daily. 08/14/15  Yes Robbie Lis, MD  aspirin EC 325 MG tablet Take 1 tablet (325 mg total) by mouth daily. 06/08/15  Yes Rosalin Hawking, MD  atorvastatin (LIPITOR) 10 MG tablet Take 1 tablet (10 mg total) by mouth daily at 6 PM. Patient taking differently: Take 10 mg by mouth daily.  03/23/15  Yes [name], DO  blood glucose meter kit and supplies Use daily to check blood sugar. DX: E11.09 10/16/15  Yes Golden Circle, [name]  clopidogrel (PLAVIX) 75 MG tablet Take 75 mg by mouth daily.   Yes Historical Provider, MD  feeding supplement, GLUCERNA SHAKE, (GLUCERNA SHAKE) LIQD Take 237 mLs by mouth 2 (two) times daily between meals. 08/14/15  Yes Robbie Lis, MD  FLUoxetine (PROZAC) 20 MG capsule Take 20 mg by mouth daily.   Yes Historical Provider, MD  folic acid (FOLVITE) 1 MG tablet  Take 1 mg by mouth daily.   Yes Historical Provider, MD  glucose blood (COOL BLOOD GLUCOSE TEST STRIPS) test strip Use as instructed to check blood sugar up to 3 times a day. DX: E11.09 10/16/15  Yes Golden Circle, [name]  insulin aspart (NOVOLOG) 100 UNIT/ML injection Inject 3 Units into the skin 3 (three) times daily with meals. 09/27/15  Yes Lavina Hamman, MD  insulin aspart protamine- aspart (NOVOLOG MIX 70/30) (70-30) 100 UNIT/ML injection Inject 0.22 mLs (22 Units total) into the skin 2 (two) times daily with a meal. 09/27/15  Yes Lavina Hamman, MD  Insulin Pen Needle 31G X 5 MM MISC 1 each by Does not apply route 3 (three) times daily. 10/11/15  Yes Golden Circle, [name]  Lancets MISC Use as  instructed to check blood sugar up to 3 times a day. DX: E11.09 10/16/15  Yes Golden Circle, [name]  oxyCODONE (ROXICODONE) 15 MG immediate release tablet Take 15 mg by mouth 4 (four) times daily.   Yes Historical Provider, MD   BP 127/63 mmHg  Pulse 93  Temp(Src) 97.9 F (36.6 C) (Oral)  Resp 15  Ht '5\' 9"'  (1.753 m)  Wt 146 lb 2.6 oz (66.3 kg)  BMI 21.57 kg/m2  SpO2 99%  Vitals reviewed Physical Exam  Physical Examination: General appearance - alert, well appearing, and in no distress Mental status - somnolent, slurring speech Eyes - pinpoint pupils bilaterally Mouth - mucous membranes moist, pharynx normal without lesions , blue powder residue around nose and upper lip Neck - supple, no significant adenopathy Chest - clear to auscultation, no wheezes, rales or rhonchi, symmetric air entry Heart - normal rate, regular rhythm, normal S1, S2, no murmurs, rubs, clicks or gallops Abdomen - soft, nontender, nondistended, no masses or organomegaly Neurological - somnolent, answering questions but with slurred voice, moving all extremities, not following commands Extremities - peripheral pulses normal, no pedal edema, no clubbing or cyanosis Skin - normal coloration and turgor, no rashes  ED Course  Procedures (including critical care time)  CRITICAL CARE Performed by: Threasa Beards Total critical care time: 60  minutes Critical care time was exclusive of separately billable procedures and treating other patients. Critical care was necessary to treat or prevent imminent or life-threatening deterioration. Critical care was time spent personally by me on the following activities: development of treatment plan with patient and/or surrogate as well as nursing, discussions with consultants, evaluation of patient's response to treatment, examination of patient, obtaining history from patient or surrogate, ordering and performing treatments and interventions, ordering and review of laboratory  studies, ordering and review of radiographic studies, pulse oximetry and re-evaluation of patient's condition.  Labs Review Labs Reviewed  COMPREHENSIVE METABOLIC PANEL - Abnormal; Notable for the following:    Chloride 112 (*)    Glucose, Bld 24 (*)    Calcium 8.8 (*)    ALT 13 (*)    All other components within normal limits  CBC WITH DIFFERENTIAL/PLATELET - Abnormal; Notable for the following:    RBC 3.32 (*)    Hemoglobin 9.9 (*)    HCT 32.0 (*)    All other components within normal limits  URINE RAPID DRUG SCREEN, HOSP PERFORMED - Abnormal; Notable for the following:    Opiates POSITIVE (*)    Benzodiazepines POSITIVE (*)    All other components within normal limits  ACETAMINOPHEN LEVEL - Abnormal; Notable for the following:    Acetaminophen (Tylenol), Serum <10 (*)  All other components within normal limits  URINALYSIS, ROUTINE W REFLEX MICROSCOPIC (NOT AT [facility]) - Abnormal; Notable for the following:    Glucose, UA 100 (*)    Protein, ur 30 (*)    All other components within normal limits  URINE MICROSCOPIC-ADD ON - Abnormal; Notable for the following:    Bacteria, UA MANY (*)    Casts HYALINE CASTS (*)    All other components within normal limits  COMPREHENSIVE METABOLIC PANEL - Abnormal; Notable for the following:    Chloride 113 (*)    Glucose, Bld 43 (*)    ALT 14 (*)    All other components within normal limits  MAGNESIUM - Abnormal; Notable for the following:    Magnesium 2.5 (*)    All other components within normal limits  CBC WITH DIFFERENTIAL/PLATELET - Abnormal; Notable for the following:    RBC 3.73 (*)    Hemoglobin 11.4 (*)    HCT 36.6 (*)    All other components within normal limits  APTT - Abnormal; Notable for the following:    aPTT 21 (*)    All other components within normal limits  HEMOGLOBIN A1C - Abnormal; Notable for the following:    Hgb A1c MFr Bld 9.4 (*)    All other components within normal limits  COMPREHENSIVE METABOLIC PANEL -  Abnormal; Notable for the following:    Glucose, Bld 162 (*)    ALT 12 (*)    All other components within normal limits  CBC - Abnormal; Notable for the following:    RBC 3.52 (*)    Hemoglobin 10.8 (*)    HCT 34.3 (*)    All other components within normal limits  GLUCOSE, CAPILLARY - Abnormal; Notable for the following:    Glucose-Capillary 155 (*)    All other components within normal limits  GLUCOSE, CAPILLARY - Abnormal; Notable for the following:    Glucose-Capillary 139 (*)    All other components within normal limits  CBG MONITORING, ED - Abnormal; Notable for the following:    Glucose-Capillary 15 (*)    All other components within normal limits  CBG MONITORING, ED - Abnormal; Notable for the following:    Glucose-Capillary 14 (*)    All other components within normal limits  CBG MONITORING, ED - Abnormal; Notable for the following:    Glucose-Capillary 112 (*)    All other components within normal limits  CBG MONITORING, ED - Abnormal; Notable for the following:    Glucose-Capillary 54 (*)    All other components within normal limits  CBG MONITORING, ED - Abnormal; Notable for the following:    Glucose-Capillary 63 (*)    All other components within normal limits  MRSA PCR SCREENING  ETHANOL  SALICYLATE LEVEL  PHOSPHORUS  PROTIME-INR  TSH  GLUCOSE, CAPILLARY  GLUCOSE, CAPILLARY  CBG MONITORING, ED    Imaging Review Dg Chest Portable 1 View  11/01/2015  CLINICAL DATA:  Altered mental status. EXAM: PORTABLE CHEST 1 VIEW COMPARISON:  August 08, 2015. FINDINGS: Stable cardiomediastinal silhouette. No pneumothorax or pleural effusion is noted. No acute pulmonary disease is noted. Bony thorax is intact. IMPRESSION: No acute cardiopulmonary abnormality seen. Electronically Signed   By: Marijo Conception, M.D.   On: 11/01/2015 15:27   I have personally reviewed and evaluated these images and lab results as part of my medical decision-making.   EKG  Interpretation   Date/Time:  Thursday November 01 2015 13:28:27 EST Ventricular Rate:  45  PR Interval:  165 QRS Duration: 88 QT Interval:  388 QTC Calculation: 419 R Axis:   -47 Text Interpretation:  Sinus rhythm LAD, consider left anterior fascicular  block Probable anteroseptal infarct, recent No significant change since  last tracing Confirmed by [facility]  MD, Jonette Wassel [phone]) on 11/01/2015 2:07:44  PM      MDM   Final diagnoses:  SOB (shortness of breath)  Overdose Hypoglycemia Altered mental status  Per famiily pt has history of snorting his meds and using them inappropriately.  His blood glucose this morning was 70- family encouraged him to recheck the blood sugar and was then found in his room unresponsive.  Per family and EMS there were pills on the bed around him.    2:45PM- after my initial assesment pt found to having increased somolence.  I had ordered CBG and when obtained this was 15- d50 bolus given.  Pt did have minimal response to narcan 0.61m.  redosed with 179mnarcan.    2:59 PM repeat blood glucose is 54- was initialy 15- given D50 then increased to 115- repeat had fallen again.  Pt placed on D10 infusion and will check hourly CBGs until resolved.  Pt is not awake enough to eat at this time.    4:14 PM repeat blood glucose 63, increased D10 drip and given another D50 bolus.  Pt continues to maintain his airway.  D/w Dr. DeCharlies Silvershe will need admission to step down.  Will also start on narcan drip due to opiates in urine and concern for opiate overdose in conjunction with benzos.  D/w Dr CoLacinda Axonhe is aware of patient's status as well while he is awaiting bed placemen.  Temporary admission orders written.    MaAlfonzo BeersMD 11/02/15 [phone]

## 2015-11-01 NOTE — Progress Notes (Signed)
CRITICAL VALUE ALERT  Critical value received:  Glucose 43  Date of notification:  11/01/2015  Time of notification:  2145  Critical value read back:Yes.    Nurse who received alert:  Reche Dixon  MD notified (1st page):  Fredirick Maudlin  Time of first page:  2145  MD notified (2nd page):  Time of second page:  Responding MD:  Fredirick Maudlin  Time MD responded:  2150

## 2015-11-01 NOTE — ED Notes (Signed)
[name] [name], [name] [name]  AQ:[id number]

## 2015-11-01 NOTE — H&P (Signed)
[facility] History and Physical  Jessup Ogas WUJ:[id number] DOB: 08/09/1946 DOA: 11/01/2015  Referring physician: ER physician: Dr. Alfonzo Beers  PCP: Mauricio Po, [name]  Chief Complaint: altered mental status   HPI:  70 year old male with past medical history of uncontrolled diabetes secondary to poor adherence to medical regimen, hypertension, CKD stage 3, dyslipidemia, hospitalizations in 08/2015 one for acute encephalopathy and one for hyperglycemia who presented to [facility] ED with lethargy. His family is not immediately present at the bedside to proved details of HPI but per ED review patient had apparently went to his room to use insulin and was then found slumped over in a chair. Usually his mental status is such that when episodes like this happen he quickly recovers but now per family he continued to be lethargic and less responsive. No respiratory distress. No vomiting.   In ED, BP was as high as 220/124 but with hydralazine 10 mg IV it came down to 164/88. UDS was positive for opiates and benzos. He was given narcan in ED with no significant change in mental status. He is able to protect airways, Further his CBG was as low as 24 but has eventually improved with D10% to low 90's. He was admitted to SDU for further monitoring.    Assessment & Plan    Principal Problem:   Acute encephalopathy - Likely due to overdose on xanax and possible insulin - UDS positive for opiates and xanax - CBG 24 on admission - Still lethargic even after narcan - Hold all PO meds, hold prozac,  - Stable respiratory status - Admission to SDU - CIWA protocol ordered   Active Problems:   Anemia of chronic disease - Likely due to anemia of chronic kidney disease - Hemoglobin stable at 9.9    CKD (chronic kidney disease) stage 3, GFR 30-59 ml/min - Recent baseline 2.1  - Cr WNL on this admission     Uncontrolled type 2 diabetes mellitus with diabetic nephropathy, with long-term current use of  insulin ([name]) - Recent A1c 13 indicating poor glycemic control secondary to poor adherence to insulin regimen  - Due to questionable insulin overdose all insulin held on admission - CBG's slowly up to 90's range    Benign essential HTN - Use hydralazine 10 mg every 8 hours scheduled and then as needed for BP above 150/90    Dyslipidemia associated with type 2 diabetes mellitus ([name]) - Resume atorvastatin once his mental status improves    Depression - Hold Prozac for now until mental status improves - Psych consult in am    Accelerated hypertension    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 Electronically Signed   By: Marijo Conception, M.D.   On: 11/01/2015 15:27    Code Status: Full Family Communication: Family not at the bedside  Disposition Plan: Admit for further evaluation, SDU   Leisa Lenz, MD  [facility] Pager [phone]  Time spent in minutes: 75 minutes  Review of Systems:  Unable to obtain due to altered mental status   Past Medical History  Diagnosis Date  . Diabetes mellitus without complication ([place])   . Hypertension   . Hernia   . Back pain   . Seizures ([place])   . Diabetic neuropathy ([place])   . Stroke [facility])    Past  Surgical History  Procedure Laterality Date  . Hip arthroplasty Right 02/05/2013    Procedure: ARTHROPLASTY BIPOLAR HIP;  Surgeon: [name], MD;  Location: [facility];  Service: Orthopedics;  Laterality: Right;   Social History:  reports that he quit smoking about 2 years ago. His smoking use included Cigarettes. He has a 7.5 pack-year smoking history. He has never used smokeless tobacco. He reports that he does not drink alcohol or use illicit drugs.  No Known Allergies  Family History:  Family History  Problem Relation Age of Onset  .  Diabetes Mother   . Alzheimer's disease Mother   . Hypertension Mother   . Hyperlipidemia Father   . Hypertension Father   . Healthy Maternal Grandmother   . Pneumonia Maternal Grandfather      Prior to Admission medications   Medication Sig Start Date End Date Taking? Authorizing Provider  acetaminophen (TYLENOL) 325 MG tablet Take 2 tablets (650 mg total) by mouth every 6 (six) hours as needed for mild pain (or Fever >/= 101). 08/14/15   Robbie Lis, MD  alprazolam [name]) 2 MG tablet Take 0.5 tablets (1 mg total) by mouth 2 (two) times daily as needed for anxiety. 08/24/15   Verlee Monte, MD  amLODipine (NORVASC) 10 MG tablet Take 1 tablet (10 mg total) by mouth daily. 08/14/15   Robbie Lis, MD  aspirin EC 325 MG tablet Take 1 tablet (325 mg total) by mouth daily. 06/08/15   Rosalin Hawking, MD  atorvastatin (LIPITOR) 10 MG tablet Take 1 tablet (10 mg total) by mouth daily at 6 PM. Patient taking differently: Take 10 mg by mouth daily.  03/23/15   [name], DO  blood glucose meter kit and supplies Use daily to check blood sugar. DX: E11.09 10/16/15   Golden Circle, [name]  clopidogrel (PLAVIX) 75 MG tablet Take 75 mg by mouth daily.    Historical Provider, MD  feeding supplement, GLUCERNA SHAKE, (GLUCERNA SHAKE) LIQD Take 237 mLs by mouth 2 (two) times daily between meals. 08/14/15   Robbie Lis, MD  FLUoxetine (PROZAC) 20 MG capsule Take 20 mg by mouth daily.    Historical Provider, MD  folic acid (FOLVITE) 1 MG tablet Take 1 mg by mouth daily.    Historical Provider, MD  glucose blood (COOL BLOOD GLUCOSE TEST STRIPS) test strip Use as instructed to check blood sugar up to 3 times a day. DX: E11.09 10/16/15   Golden Circle, [name]  insulin aspart (NOVOLOG) 100 UNIT/ML injection Inject 3 Units into the skin 3 (three) times daily with meals. 09/27/15   Lavina Hamman, MD  insulin aspart protamine- aspart (NOVOLOG MIX 70/30) (70-30) 100 UNIT/ML injection Inject 0.22 mLs (22 Units total) into  the skin 2 (two) times daily with a meal. 09/27/15   Lavina Hamman, MD  Insulin Pen Needle 31G X 5 MM MISC 1 each by Does not apply route 3 (three) times daily. 10/11/15   Golden Circle, [name]  Lancets MISC Use as instructed to check blood sugar up to 3 times a day. DX: E11.09 10/16/15   Golden Circle, [name]  Oxycodone HCl 20 MG TABS Take 20 mg by mouth 4 (four) times daily.    Historical Provider, MD  PRESCRIPTION MEDICATION Place 1 Dose into the right eye every 30 (thirty) days. Eye injection    Historical Provider, MD   Physical Exam: Filed Vitals:   11/01/15 1322 11/01/15 1327 11/01/15 1430 11/01/15  1542  BP:  187/92 155/79 220/124  Pulse:  77 72 64  Temp:  97.2 F (36.2 C)    TempSrc:  Oral    Resp:  20 33 26  SpO2: 98% 99% 100% 100%    Physical Exam  Constitutional: Appears well-developed and well-nourished. No distress.  HENT: Normocephalic. No tonsillar erythema or exudates Eyes: Conjunctivae are normal. No scleral icterus.  Neck: Normal ROM. Neck supple. No JVD. No tracheal deviation. No thyromegaly.  CVS: RRR, S1/S2 appreciated  Pulmonary: Effort and breath sounds normal, no stridor, rhonchi, wheezes, rales.  Abdominal: Soft. BS +,  no distension, tenderness, rebound or guarding.  Musculoskeletal: Normal range of motion. No edema and no tenderness.  Lymphadenopathy: No lymphadenopathy noted, cervical, inguinal. Neuro: Lethargic. No focal neurologic deficits. Skin: Skin is warm and dry. No rash noted.  No erythema. No pallor.  Psychiatric:Unable to assess due to altered mental status  Labs on Admission:  Basic Metabolic Panel:  Recent Labs Lab 11/01/15 1359  NA 144  K 3.9  CL 112*  CO2 25  GLUCOSE 24*  BUN 19  CREATININE 0.96  CALCIUM 8.8*   Liver Function Tests:  Recent Labs Lab 11/01/15 1359  AST 19  ALT 13*  ALKPHOS 69  BILITOT 0.7  PROT 6.9  ALBUMIN 3.6   No results for input(s): LIPASE, AMYLASE in the last 168 hours. No results for input(s):  AMMONIA in the last 168 hours. CBC:  Recent Labs Lab 11/01/15 1359  WBC 6.2  NEUTROABS 2.6  HGB 9.9*  HCT 32.0*  MCV 96.4  PLT 255   Cardiac Enzymes: No results for input(s): CKTOTAL, CKMB, CKMBINDEX, TROPONINI in the last 168 hours. BNP: Invalid input(s): POCBNP CBG:  Recent Labs Lab 11/01/15 1407 11/01/15 1409 11/01/15 1422 11/01/15 1458 11/01/15 1556  GLUCAP 15* 14* 112* 54* 63*    If 7PM-7AM, please contact night-coverage [email or web address] Password [email or web address] 11/01/2015, 4:08 PM

## 2015-11-01 NOTE — Progress Notes (Signed)
Hypoglycemic Event  CBG: 67  Treatment: D50 IV 50 mL  Symptoms: None  Follow-up CBG: Time:2355 CBG Result:80  Possible Reasons for Event: Inadequate meal intake and Medication regimen: patient took too much insulin prior to hospitalization  Comments/MD notified:T Rogue Bussing notified    Orrin Brigham

## 2015-11-01 NOTE — ED Notes (Signed)
Per [facility] pt from home , pt snorted 2 xanax pills. Per [facility] pt reports weakness , denies SI , did it to feel "high". Pinpoint pupils, slurred speech, yet oriented x 4 .

## 2015-11-02 ENCOUNTER — Encounter (HOSPITAL_COMMUNITY): Payer: Self-pay | Admitting: Radiology

## 2015-11-02 ENCOUNTER — Inpatient Hospital Stay (HOSPITAL_COMMUNITY): Payer: Medicare Other

## 2015-11-02 DIAGNOSIS — Z79899 Other long term (current) drug therapy: Secondary | ICD-10-CM

## 2015-11-02 DIAGNOSIS — E15 Nondiabetic hypoglycemic coma: Secondary | ICD-10-CM | POA: Diagnosis present

## 2015-11-02 DIAGNOSIS — F1120 Opioid dependence, uncomplicated: Secondary | ICD-10-CM | POA: Diagnosis present

## 2015-11-02 DIAGNOSIS — J690 Pneumonitis due to inhalation of food and vomit: Secondary | ICD-10-CM | POA: Diagnosis present

## 2015-11-02 LAB — COMPREHENSIVE METABOLIC PANEL
ALT: 12 U/L — ABNORMAL LOW (ref 17–63)
AST: 20 U/L (ref 15–41)
Albumin: 3.7 g/dL (ref 3.5–5.0)
Alkaline Phosphatase: 71 U/L (ref 38–126)
Anion gap: 9 (ref 5–15)
BUN: 14 mg/dL (ref 6–20)
CO2: 24 mmol/L (ref 22–32)
Calcium: 9.1 mg/dL (ref 8.9–10.3)
Chloride: 110 mmol/L (ref 101–111)
Creatinine, Ser: 0.88 mg/dL (ref 0.61–1.24)
GFR calc Af Amer: >60 mL/min (ref >60)
GFR calc non Af Amer: >60 mL/min (ref >60)
Glucose, Bld: 162 mg/dL — ABNORMAL HIGH (ref 65–99)
Potassium: 3.6 mmol/L (ref 3.5–5.1)
Sodium: 143 mmol/L (ref 135–145)
Total Bilirubin: 0.6 mg/dL (ref 0.3–1.2)
Total Protein: 7.1 g/dL (ref 6.5–8.1)

## 2015-11-02 LAB — CBC
HCT: 34.3 % — ABNORMAL LOW (ref 39.0–52.0)
Hemoglobin: 10.8 g/dL — ABNORMAL LOW (ref 13.0–17.0)
MCH: 30.7 pg (ref 26.0–34.0)
MCHC: 31.5 g/dL (ref 30.0–36.0)
MCV: 97.4 fL (ref 78.0–100.0)
Platelets: 291 10*3/uL (ref 150–400)
RBC: 3.52 MIL/uL — ABNORMAL LOW (ref 4.22–5.81)
RDW: 13.8 % (ref 11.5–15.5)
WBC: 6.2 10*3/uL (ref 4.0–10.5)

## 2015-11-02 LAB — GLUCOSE, CAPILLARY
Glucose-Capillary: 155 mg/dL — ABNORMAL HIGH (ref 65–99)
Glucose-Capillary: 139 mg/dL — ABNORMAL HIGH (ref 65–99)
Glucose-Capillary: 331 mg/dL — ABNORMAL HIGH (ref 65–99)
Glucose-Capillary: 242 mg/dL — ABNORMAL HIGH (ref 65–99)
Glucose-Capillary: 284 mg/dL — ABNORMAL HIGH (ref 65–99)

## 2015-11-02 LAB — HEMOGLOBIN A1C
Hgb A1c MFr Bld: 9.4 % — ABNORMAL HIGH (ref 4.8–5.6)
Mean Plasma Glucose: 223 mg/dL

## 2015-11-02 MED ORDER — INSULIN ASPART 100 UNIT/ML ~~LOC~~ SOLN
0.0000 [IU] | Freq: Three times a day (TID) | SUBCUTANEOUS | Status: DC
Start: 2015-11-02 — End: 2015-11-03
  Administered 2015-11-02: 7 [IU] via SUBCUTANEOUS
  Administered 2015-11-02 – 2015-11-03 (×3): 3 [IU] via SUBCUTANEOUS

## 2015-11-02 MED ORDER — INSULIN GLARGINE 100 UNIT/ML ~~LOC~~ SOLN
8.0000 [IU] | Freq: Every day | SUBCUTANEOUS | Status: DC
  Administered 2015-11-02 – 2015-11-03 (×2): 8 [IU] via SUBCUTANEOUS
  Filled 2015-11-02 (×2): qty 0.08

## 2015-11-02 MED ORDER — ALPRAZOLAM 1 MG PO TABS: 1.0000 mg | ORAL_TABLET | Freq: Two times a day (BID) | ORAL | Status: DC | PRN

## 2015-11-02 MED ORDER — LEVOFLOXACIN IN D5W 750 MG/150ML IV SOLN
750.0000 mg | INTRAVENOUS | Status: DC
  Administered 2015-11-02: 750 mg via INTRAVENOUS
  Filled 2015-11-02: qty 150

## 2015-11-02 MED ORDER — IOHEXOL 300 MG/ML  SOLN
75.0000 mL | Freq: Once | INTRAMUSCULAR | Status: AC | PRN
  Administered 2015-11-02: 75 mL via INTRAVENOUS

## 2015-11-02 NOTE — Progress Notes (Signed)
[name] [name] [id number] DOB: 1946-02-03 DOA: 11/01/2015 PCP: Mauricio Po, [name]  Summary&Daily Progress Notes 11/02/15: I have seen and examined [name] [name] at bedside and reviewed his chart. [name] [name] is a pleasant 70 year old male with uncontrolled diabetes secondary to poor adherence to medical regimen(HbA1c 9.4), hypertension, CKD stage 3, dyslipidemia, hospitalizations in 08/2015 one for acute encephalopathy and one for hyperglycemia who presented to [facility] ED with lethargy and he was found to have acute metabolic encephalopathy possibly related to hypoglycemia/narcotic, benzodiazepine combination. He denies intentional overdose and he is not sure what happened. However, he is short of breath and tells me that his primary care doctor diagnosed a spot in his lung a couple of months ago. He is more with it today and asks for food. Will obtain CT chest with contrast to better evaluate shortness of breath, continue IV fluids, monitor and optimize blood pressure control as necessary and start feeds. Will resume Xanax to avoid benzodiazepine withdrawal. Will continue to monitor sugars and start sliding scale insulin. 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 Denies chest pain.  Objective: Filed Vitals:   11/02/15 0619 11/02/15 0800  BP: 189/84 127/63  Pulse: 93 93  Temp:    Resp: 22 15    Intake/Output Summary (Last 24 hours) at 11/02/15 0904 Last data filed at 11/02/15 0626  Gross per 24 hour  Intake 748.66 ml  Output    600 ml  Net 148.66 ml   Filed Weights   11/01/15 2000  Weight: 66.3 kg (146 lb 2.6 oz)    Exam:   General:  Comfortable at rest.  Cardiovascular: S1-S2 normal. No murmurs. Pulse regular.  Respiratory: Good air entry bilaterally. No rhonchi or rales.  Abdomen: Soft and nontender. Normal bowel sounds. No organomegaly.  Musculoskeletal: No pedal edema   Neurological: Intact  Data Reviewed: Basic Metabolic Panel:  Recent Labs Lab 11/01/15 1359 11/01/15 1950 11/02/15 0335  NA 144 145 143  K 3.9 3.8 3.6  CL 112* 113* 110  CO2 25 22 24   GLUCOSE 24* 43* 162*  BUN 19 16 14   CREATININE 0.96 0.85 0.88  CALCIUM 8.8* 9.0 9.1  MG  --  2.5*  --   PHOS  --  3.0  --    Liver Function Tests:  Recent Labs Lab 11/01/15 1359 11/01/15 1950 11/02/15 0335  AST 19 23 20   ALT 13* 14* 12*  ALKPHOS 69 73 71  BILITOT 0.7 0.8 0.6  PROT 6.9 7.4 7.1  ALBUMIN 3.6 3.8 3.7   No results for input(s): LIPASE, AMYLASE in the last 168 hours. No results for input(s): AMMONIA in the last  168 hours. CBC:  Recent Labs Lab 11/01/15 1359 11/01/15 1950 11/02/15 0335  WBC 6.2 6.0 6.2  NEUTROABS 2.6 3.5  --   HGB 9.9* 11.4* 10.8*  HCT 32.0* 36.6* 34.3*  MCV 96.4 98.1 97.4  PLT 255 264 291   Cardiac Enzymes: No results for input(s): CKTOTAL, CKMB, CKMBINDEX, TROPONINI in the last 168 hours. BNP (last 3 results) No results for input(s): BNP in the last 8760 hours.  ProBNP (last 3 results) No results for input(s): PROBNP in the last 8760 hours.  CBG:  Recent Labs Lab 11/01/15 1723  11/01/15 2312 11/01/15 2356 11/02/15 0405 11/02/15 0825  GLUCAP 91 67 80 155* 139*    Recent Results (from the past 240 hour(s))  MRSA PCR Screening     Status: None   Collection Time: 11/01/15  6:56 PM  Result Value Ref Range Status   MRSA by PCR NEGATIVE NEGATIVE Final    Comment:        The GeneXpert MRSA Assay (FDA approved for NASAL specimens only), is one component of a comprehensive MRSA colonization surveillance program. It is not intended to diagnose MRSA infection nor to guide or monitor treatment for MRSA infections.      Studies: Dg Chest Portable 1 View  11/01/2015  CLINICAL DATA:  Altered mental status. EXAM: PORTABLE CHEST 1 VIEW COMPARISON:  August 08, 2015. FINDINGS: Stable cardiomediastinal silhouette. No pneumothorax or pleural effusion is noted. No acute pulmonary disease is noted. Bony thorax is intact. IMPRESSION: No acute cardiopulmonary abnormality seen. Electronically Signed   By: Marijo Conception, M.D.   On: 11/01/2015 15:27    Scheduled Meds: . amLODipine  10 mg Oral Daily  . antiseptic oral rinse  7 mL Mouth Rinse BID  . aspirin EC  325 mg Oral Daily  . atorvastatin  10 mg Oral QPM  . clopidogrel  75 mg Oral Daily  . folic acid  1 mg Oral Daily  . hydrALAZINE  20 mg Intravenous Q6H  . multivitamin with minerals  1 tablet Oral Daily  . sodium chloride  3 mL Intravenous Q12H  . thiamine  100 mg Oral Daily   Or  . thiamine  100 mg Intravenous Daily   Continuous Infusions: . sodium chloride 50 mL/hr at 11/02/15 0626  . dextrose 50 mL/hr at 11/02/15 [id number]     Time spent: 25 minutes    Brailen Macneal  [facility] Pager [phone]. If 7PM-7AM, please contact night-coverage at [email or web address], password [facility] 11/02/2015, 9:04 AM  LOS: 1 day

## 2015-11-02 NOTE — Progress Notes (Addendum)
Patient arrived from ICU.  Alert & oriented x4.  Denies pain.  Room air.  Respirations even and unlabored.  No s/s of distress.  Telemetry being applied on unit.

## 2015-11-02 NOTE — Care Management Note (Signed)
Case Management Note  Patient Details  Name: [name] [name] MRN: [id number] Date of Birth: January 24, 1946  Subjective/Objective:             overdose       Action/Plan: Will follow for discharge needs/ Rhonda davis,BSN,CCM,RN  Expected Discharge Date:   (UNKNOWN)               Expected Discharge Plan:  Home/Self Care  In-House Referral:  NA  Discharge planning Services  CM Consult  Post Acute Care Choice:    Choice offered to:     DME Arranged:    DME Agency:     HH Arranged:    HH Agency:     Status of Service:  In process, will continue to follow  Medicare Important Message Given:    Date Medicare IM Given:    Medicare IM give by:    Date Additional Medicare IM Given:    Additional Medicare Important Message give by:     If discussed at [place] of Stay Meetings, dates discussed:    Additional Comments:  Leeroy Cha, RN 11/02/2015, 9:57 AM

## 2015-11-02 NOTE — Progress Notes (Signed)
Inpatient Diabetes Program Recommendations  AACE/ADA: New Consensus Statement on Inpatient Glycemic Control (2015)  Target Ranges:  Prepandial:   less than 140 mg/dL      Peak postprandial:   less than 180 mg/dL (1-2 hours)      Critically ill patients:  140 - 180 mg/dL    Results for [name] [name], [name] [name] (MRN [id number]) as of 11/02/2015 12:37  Ref. Range 11/01/2015 14:07 11/01/2015 14:09 11/01/2015 14:22 11/01/2015 14:58 11/01/2015 15:56 11/01/2015 17:23 11/01/2015 23:12 11/01/2015 23:56 11/02/2015 04:05 11/02/2015 08:25 11/02/2015 11:42  Glucose-Capillary Latest Ref Range: 65-99 mg/dL 15 (LL) 14 (LL) 112 (H) 54 (L) 63 (L) 91 67 80 155 (H) 139 (H) 331 (H)    Admit with: AMS/ Hypoglycemia/ Question OD of narcotic, benzodiazepine combination  History: DM, CKD3  Home DM Meds: 70/30 insulin- 22 units bidwc       Novolog 3 units tidwc (not sure if patient is taking Novolog at home)  Current Insulin Orders: Novolog Sensitive SSI (0-9 units) TID AC      -Note Novolog Sensitive SSI started today at 12pm.  -CBG up to 331 mg/dl at 12pm today.    MD- Once patient resumes full diet, may want to slowly start a portion of patient's home dose of 70/30 insulin-  Would start with 50% and titrate upward as needed- 10 units bidwc to start    --Will follow patient during hospitalization--  Wyn Quaker RN, MSN, CDE Diabetes Coordinator Inpatient Glycemic Control Team Team Pager: [phone] (8a-5p)

## 2015-11-03 LAB — CBC WITH DIFFERENTIAL/PLATELET
Basophils Absolute: 0 10*3/uL (ref 0.0–0.1)
Basophils Relative: 0 %
Eosinophils Absolute: 0.2 10*3/uL (ref 0.0–0.7)
Eosinophils Relative: 4 %
HCT: 32 % — ABNORMAL LOW (ref 39.0–52.0)
Hemoglobin: 10 g/dL — ABNORMAL LOW (ref 13.0–17.0)
Lymphocytes Relative: 40 %
Lymphs Abs: 2.5 10*3/uL (ref 0.7–4.0)
MCH: 29.8 pg (ref 26.0–34.0)
MCHC: 31.3 g/dL (ref 30.0–36.0)
MCV: 95.2 fL (ref 78.0–100.0)
Monocytes Absolute: 0.5 10*3/uL (ref 0.1–1.0)
Monocytes Relative: 8 %
Neutro Abs: 3.1 10*3/uL (ref 1.7–7.7)
Neutrophils Relative %: 48 %
Platelets: 269 10*3/uL (ref 150–400)
RBC: 3.36 MIL/uL — ABNORMAL LOW (ref 4.22–5.81)
RDW: 13.6 % (ref 11.5–15.5)
WBC: 6.4 10*3/uL (ref 4.0–10.5)

## 2015-11-03 LAB — GLUCOSE, CAPILLARY
Glucose-Capillary: 222 mg/dL — ABNORMAL HIGH (ref 65–99)
Glucose-Capillary: 229 mg/dL — ABNORMAL HIGH (ref 65–99)

## 2015-11-03 LAB — COMPREHENSIVE METABOLIC PANEL
ALT: 11 U/L — ABNORMAL LOW (ref 17–63)
AST: 19 U/L (ref 15–41)
Albumin: 3.2 g/dL — ABNORMAL LOW (ref 3.5–5.0)
Alkaline Phosphatase: 69 U/L (ref 38–126)
Anion gap: 9 (ref 5–15)
BUN: 18 mg/dL (ref 6–20)
CO2: 24 mmol/L (ref 22–32)
Calcium: 8.8 mg/dL — ABNORMAL LOW (ref 8.9–10.3)
Chloride: 109 mmol/L (ref 101–111)
Creatinine, Ser: 1.06 mg/dL (ref 0.61–1.24)
GFR calc Af Amer: >60 mL/min (ref >60)
GFR calc non Af Amer: >60 mL/min (ref >60)
Glucose, Bld: 160 mg/dL — ABNORMAL HIGH (ref 65–99)
Potassium: 3.9 mmol/L (ref 3.5–5.1)
Sodium: 142 mmol/L (ref 135–145)
Total Bilirubin: 0.4 mg/dL (ref 0.3–1.2)
Total Protein: 6.4 g/dL — ABNORMAL LOW (ref 6.5–8.1)

## 2015-11-03 MED ORDER — METOPROLOL TARTRATE 25 MG PO TABS
25.0000 mg | ORAL_TABLET | Freq: Two times a day (BID) | ORAL | Status: DC
  Administered 2015-11-03: 25 mg via ORAL
  Filled 2015-11-03 (×2): qty 1

## 2015-11-03 MED ORDER — THIAMINE HCL 100 MG PO TABS: 100.0000 mg | ORAL_TABLET | Freq: Every day | ORAL | Status: DC

## 2015-11-03 MED ORDER — CLONIDINE HCL 0.1 MG PO TABS
0.1000 mg | ORAL_TABLET | Freq: Three times a day (TID) | ORAL | Status: DC | PRN
  Filled 2015-11-03: qty 1

## 2015-11-03 MED ORDER — METOPROLOL TARTRATE 25 MG PO TABS: 25.0000 mg | ORAL_TABLET | Freq: Two times a day (BID) | ORAL | Status: DC

## 2015-11-03 MED ORDER — LEVOFLOXACIN 750 MG PO TABS
750.0000 mg | ORAL_TABLET | Freq: Every evening | ORAL | Status: DC
  Filled 2015-11-03: qty 1

## 2015-11-03 MED ORDER — LEVOFLOXACIN 750 MG PO TABS: 750.0000 mg | ORAL_TABLET | Freq: Every evening | ORAL | Status: DC

## 2015-11-03 NOTE — Progress Notes (Signed)
Patient discharged.  Leaving with personal belongings and three prescriptions to be picked up at pharmacy.  Patient sister picking up patient at discharge.  Room air.  No s/s of pain or distress.  No complaints.  A&O x4.

## 2015-11-03 NOTE — Progress Notes (Signed)
PHARMACIST - PHYSICIAN COMMUNICATION DR:  Sanjuana Letters  CONCERNING: Antibiotic IV to Oral Route Change Policy  RECOMMENDATION: This patient is receiving Levaquin by the intravenous route.  Based on criteria approved by the Pharmacy and Therapeutics Committee, the antibiotic(s) is/are being converted to the equivalent oral dose form(s).   DESCRIPTION: These criteria include:  Patient being treated for a respiratory tract infection, urinary tract infection, cellulitis or clostridium difficile associated diarrhea if on metronidazole  The patient is not neutropenic and does not exhibit a GI malabsorption state  The patient is eating (either orally or via tube) and/or has been taking other orally administered medications for a least 24 hours  The patient is improving clinically and has a Tmax < 100.5  If you have questions about this conversion, please contact the Pharmacy Department  []   [phone] )  [facility] []   [phone] )  [facility] []   [phone] )  [facility] []   [phone] )  [facility] [x]   [phone] )  [place], PharmD, BCPS Pager: [phone] 11/03/2015@8 :35 AM

## 2015-11-03 NOTE — Discharge Summary (Signed)
[name] [name], is a 70 y.o. male  DOB 03-Jan-1946  MRN [id number].  Admission date:  11/01/2015  Admitting Physician  Robbie Lis, MD  Discharge Date:  11/03/2015   Primary MD  Mauricio Po, [name]  Recommendations for primary care physician for things to follow:  Streamline meds as possible, and optimize BP control.   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 ([name])   Uncontrolled type 2 diabetes mellitus with diabetic nephropathy, with long-term current use of insulin ([name])   Benign essential HTN   Dyslipidemia associated with type 2 diabetes mellitus ([name])   Depression   Chronic prescription benzodiazepine use   Narcotic dependency, continuous [name])      Hospital Course  [name] [name] is a pleasant 70 year old male with uncontrolled diabetes secondary to poor adherence to medical regimen(HbA1c 9.4), essential hypertension, CKD stage 3, dyslipidemia, hospitalizations in 08/2015 one for acute encephalopathy and one for hyperglycemia who presented to [facility] ED with lethargy and he was found to have acute metabolic encephalopathy possibly related to hypoglycemia/narcotic, benzodiazepine combination resulting in mild right sided aspiration pneumonitis. It is possible that pneumonia caused hypoglycemia and acute metabolic encephalopathy in setting of narcotics/benzodiazepines. He denied intentional overdose. CT chest showed "Stranding within the right axilla and right chest wall subcutaneous soft tissues. Small locules of gas adjacent to the right axillary artery. This is of unknown etiology. Has there been attempted line  placement? Minimal subsegmental atelectasis or scarring in the bases. Coronary artery disease". Patient was also found to have accelerated hypertension. He will be discharged on Levaquin to complete 6 more days. I have added Lopressor to his antihypertensive regimen. He'll need to work with his primary care provider to optimize blood pressure/diabetes control and try to streamline psychoactive medications as possible. Patient eager to go home and therefore will discharge to follow with his primary care provider in the next 1-2 weeks. He is discharged in stable condition.  Discharge Condition Stable.  Consults obtained  None  Follow UP  PCP in 1-2 weeks   Discharge Instructions  and  Discharge Medications      Discharge Instructions    Diet - low sodium heart healthy    Complete by:  As directed      Diet Carb Modified    Complete by:  As directed      Increase activity slowly    Complete by:  As directed             Medication List    TAKE these medications        acetaminophen 325 MG tablet  Commonly known as:  TYLENOL  Take 2 tablets (650 mg total) by mouth every 6 (six) hours as needed for mild pain (or Fever >/= 101).     alprazolam 2 MG tablet  Commonly known as:  XANAX  Take 0.5 tablets (1 mg total) by mouth 2 (two) times daily as needed for anxiety.     amLODipine 10 MG tablet  Commonly known as:  NORVASC  Take 1 tablet (10 mg total) by mouth daily.     aspirin EC 325 MG tablet  Take 1 tablet (325 mg total) by mouth daily.     atorvastatin 10 MG tablet  Commonly known as:  LIPITOR  Take 1 tablet (10 mg total) by mouth daily at 6 PM.     blood glucose meter kit and supplies  Use daily to check blood sugar. DX: E11.09     clopidogrel 75 MG tablet  Commonly known as:  PLAVIX  Take 75 mg by mouth daily.     feeding supplement (GLUCERNA SHAKE) Liqd  Take 237 mLs by mouth 2 (two) times daily between meals.     FLUoxetine 20 MG capsule  Commonly known as:   PROZAC  Take 20 mg by mouth daily.     folic acid 1 MG tablet  Commonly known as:  FOLVITE  Take 1 mg by mouth daily.     glucose blood test strip  Commonly known as:  COOL BLOOD GLUCOSE TEST STRIPS  Use as instructed to check blood sugar up to 3 times a day. DX: E11.09     insulin aspart 100 UNIT/ML injection  Commonly known as:  novoLOG  Inject 3 Units into the skin 3 (three) times daily with meals.     insulin aspart protamine- aspart (70-30) 100 UNIT/ML injection  Commonly known as:  NOVOLOG MIX 70/30  Inject 0.22 mLs (22 Units total) into the skin 2 (two) times daily with a meal.     Insulin Pen Needle 31G X 5 MM Misc  1 each by Does not apply route 3 (three) times daily.     Lancets Misc  Use as instructed to check blood sugar up to 3 times a day. DX: E11.09     levofloxacin 750 MG tablet  Commonly known as:  LEVAQUIN  Take 1 tablet (750 mg total) by mouth every evening.     metoprolol tartrate 25 MG tablet  Commonly known as:  LOPRESSOR  Take 1 tablet (25 mg total) by mouth 2 (two) times daily.     oxyCODONE 15 MG immediate release tablet  Commonly known as:  ROXICODONE  Take 15 mg by mouth 4 (four) times daily.     thiamine 100 MG tablet  Take 1 tablet (100 mg total) by mouth daily.        Diet and Activity recommendation: See Discharge Instructions above  Major procedures and Radiology Reports - PLEASE review detailed and final reports for all details, in brief -    Ct Chest W Contrast  11/02/2015  CLINICAL DATA:  Shortness of breath.  Evaluate for lung nodule. EXAM: CT CHEST WITH CONTRAST TECHNIQUE: Multidetector CT imaging of the chest was performed during intravenous contrast administration. CONTRAST:  45m OMNIPAQUE IOHEXOL 300 MG/ML  SOLN COMPARISON:  None. FINDINGS: There is stranding within the soft tissues in the right axilla. Small locules of gas are noted adjacent to the right axillary artery. This stranding continues inferiorly in the subcutaneous  soft tissues of the right chest wall. This is of unknown etiology. Linear subsegmental atelectasis or scarring in the lung bases. Lungs otherwise clear. No effusions. No suspicious pulmonary nodules. Heart is normal size. Aorta is normal caliber. Coronary artery calcifications. No mediastinal, hilar,  or axillary adenopathy. Imaging into the upper abdomen shows no acute findings. IMPRESSION: Stranding within the right axilla and right chest wall subcutaneous soft tissues. Small locules of gas adjacent to the right axillary artery. This is of unknown etiology. Has there been attempted line placement? Minimal subsegmental atelectasis or scarring in the bases. Coronary artery disease. Electronically Signed   By: Rolm Baptise M.D.   On: 11/02/2015 14:37   Dg Chest Portable 1 View  11/01/2015  CLINICAL DATA:  Altered mental status. EXAM: PORTABLE CHEST 1 VIEW COMPARISON:  August 08, 2015. FINDINGS: Stable cardiomediastinal silhouette. No pneumothorax or pleural effusion is noted. No acute pulmonary disease is noted. Bony thorax is intact. IMPRESSION: No acute cardiopulmonary abnormality seen. Electronically Signed   By: Marijo Conception, M.D.   On: 11/01/2015 15:27    Micro Results   Recent Results (from the past 240 hour(s))  MRSA PCR Screening     Status: None   Collection Time: 11/01/15  6:56 PM  Result Value Ref Range Status   MRSA by PCR NEGATIVE NEGATIVE Final    Comment:        The GeneXpert MRSA Assay (FDA approved for NASAL specimens only), is one component of a comprehensive MRSA colonization surveillance program. It is not intended to diagnose MRSA infection nor to guide or monitor treatment for MRSA infections.        Today   Subjective:   [name] [name] today has no headache,no chest abdominal pain,no new weakness tingling or numbness, feels much better wants to go home today.   Objective:   Blood pressure 138/75, pulse 84, temperature 98.2 F (36.8 C), temperature source  Oral, resp. rate 19, height '5\' 9"'  (1.753 m), weight 66.3 kg (146 lb 2.6 oz), SpO2 96 %.   Intake/Output Summary (Last 24 hours) at 11/03/15 1102 Last data filed at 11/02/15 1902  Gross per 24 hour  Intake    960 ml  Output   1100 ml  Net   -140 ml    Exam Awake Alert, Oriented x 3, No new F.N deficits, Normal affect [place].AT,PERRAL Supple Neck,No JVD, No cervical lymphadenopathy appriciated.  Symmetrical Chest wall movement, Good air movement bilaterally, CTAB RRR,No Gallops,Rubs or new Murmurs, No Parasternal Heave +ve B.Sounds, Abd Soft, Non tender, No organomegaly appriciated, No rebound -guarding or rigidity. No Cyanosis, Clubbing or edema, No new Rash or bruise  Data Review   CBC w Diff:  Lab Results  Component Value Date   WBC 6.4 11/03/2015   HGB 10.0* 11/03/2015   HCT 32.0* 11/03/2015   PLT 269 11/03/2015   LYMPHOPCT 40 11/03/2015   BANDSPCT 1 05/29/2013   MONOPCT 8 11/03/2015   EOSPCT 4 11/03/2015   BASOPCT 0 11/03/2015    CMP:  Lab Results  Component Value Date   NA 142 11/03/2015   K 3.9 11/03/2015   CL 109 11/03/2015   CO2 24 11/03/2015   BUN 18 11/03/2015   CREATININE 1.06 11/03/2015   PROT 6.4* 11/03/2015   ALBUMIN 3.2* 11/03/2015   BILITOT 0.4 11/03/2015   ALKPHOS 69 11/03/2015   AST 19 11/03/2015   ALT 11* 11/03/2015  .   Total Time in preparing paper work, data evaluation and todays exam - 25 minutes  Railyn House M.D on 11/03/2015 at 11:02 AM  [facility] Office  [phone]

## 2015-11-05 ENCOUNTER — Telehealth: Payer: Self-pay | Admitting: *Deleted

## 2015-11-05 LAB — GLUCOSE, CAPILLARY: Glucose-Capillary: 15 mg/dL — CL (ref 65–99)

## 2015-11-05 NOTE — Telephone Encounter (Signed)
Transition Care Management Follow-up Telephone Call   Date discharged? 11/03/15   How have you been since you were released from the hospital? Pt states he is ok   Do you understand why you were in the hospital? YES   Do you understand the discharge instructions? YES   Where were you discharged to? Home   Items Reviewed:  Medications reviewed: YES  Allergies reviewed: YES  Dietary changes reviewed: YES  Referrals reviewed: No referral needed   Functional Questionnaire:   Activities of Daily Living (ADLs):   He states he are independent in the following: ambulation, bathing and hygiene, feeding, continence, grooming, toileting and dressing States he doesn't require assistance    Any transportation issues/concerns?: NO   Any patient concerns? NO   Confirmed importance and date/time of follow-up visits scheduled YES, appt 11/13/15  Provider Appointment booked with Terri Piedra / Confirmed with patient if condition begins to worsen call PCP or go to the ER.  Patient was given the office number and encouraged to call back with question or concerns.  : YES

## 2015-11-13 ENCOUNTER — Ambulatory Visit: Payer: Medicare HMO | Admitting: Family

## 2015-11-27 ENCOUNTER — Encounter (HOSPITAL_COMMUNITY): Payer: Self-pay | Admitting: *Deleted

## 2015-11-27 ENCOUNTER — Emergency Department (HOSPITAL_COMMUNITY)
Admission: EM | Admit: 2015-11-27 | Discharge: 2015-11-27 | Disposition: A | Discharge status: Home / Self Care | Payer: Medicare Other | Attending: Emergency Medicine | Admitting: Emergency Medicine

## 2015-11-27 DIAGNOSIS — I10 Essential (primary) hypertension: Secondary | ICD-10-CM | POA: Insufficient documentation

## 2015-11-27 DIAGNOSIS — E1165 Type 2 diabetes mellitus with hyperglycemia: Secondary | ICD-10-CM | POA: Diagnosis not present

## 2015-11-27 DIAGNOSIS — R739 Hyperglycemia, unspecified: Secondary | ICD-10-CM

## 2015-11-27 LAB — CBG MONITORING, ED: Glucose-Capillary: 356 mg/dL — ABNORMAL HIGH (ref 65–99)

## 2015-11-27 MED ORDER — SODIUM CHLORIDE 0.9 % IV BOLUS (SEPSIS): 1000.0000 mL | Freq: Once | INTRAVENOUS | Status: DC

## 2015-11-27 MED ORDER — SODIUM CHLORIDE 0.9 % IV SOLN: INTRAVENOUS | Status: DC

## 2015-11-27 NOTE — ED Notes (Signed)
Pt refused to stay and states, "I have things to do". Pt signed out AMA and ambulated from ED.

## 2015-11-27 NOTE — ED Notes (Signed)
Pt arrives from home via [facility]. Pt was found with snoring respirations and was drowsy. Pt family states pt glucometer broke approx 1 week ago and hasn't been checking his cbg. Pt takes opiates for pain at home GCS 13, pt given narcan and become more alert and GCS returned to baseline at 15. Pt does have residual right sided weakness from a previous stroke.

## 2015-11-27 NOTE — ED Notes (Signed)
Pt states he wants to go home. Pt doesn't wish to stay and be evaluated. Dr.Wentz will be made aware.

## 2015-11-27 NOTE — ED Notes (Signed)
Pt states he is wanting to leave [facility]) advised pt that we cant hold him here RN aware

## 2015-11-27 NOTE — ED Provider Notes (Signed)
Patient presented from home, by EMS for evaluation of altered mental status. He was found to be lethargic and snoring. He was given Narcan with improvement in his mental status. His CBG was elevated at 390. After arrival here, he was lying in stretcher, alert, smiling and comfortable. I ordered labs and IV fluids, then went to attend other patients. After I arrived back near his room, the nurse told me that he had chosen to leave [facility]. See her documentation.  Daleen Bo, MD 11/27/15 [phone]

## 2016-03-13 ENCOUNTER — Ambulatory Visit: Payer: Medicare Other | Admitting: Family

## 2016-03-14 ENCOUNTER — Ambulatory Visit (INDEPENDENT_AMBULATORY_CARE_PROVIDER_SITE_OTHER): Payer: Medicare HMO | Admitting: Family

## 2016-03-14 ENCOUNTER — Other Ambulatory Visit (INDEPENDENT_AMBULATORY_CARE_PROVIDER_SITE_OTHER): Payer: Medicare HMO

## 2016-03-14 ENCOUNTER — Encounter: Payer: Self-pay | Admitting: Family

## 2016-03-14 VITALS — BP 152/92 | HR 79 | Temp 98.3°F | Resp 16 | Ht 69.0 in | Wt 156.0 lb

## 2016-03-14 DIAGNOSIS — Z23 Encounter for immunization: Secondary | ICD-10-CM | POA: Diagnosis not present

## 2016-03-14 DIAGNOSIS — Z794 Long term (current) use of insulin: Secondary | ICD-10-CM | POA: Diagnosis not present

## 2016-03-14 DIAGNOSIS — E1165 Type 2 diabetes mellitus with hyperglycemia: Secondary | ICD-10-CM | POA: Diagnosis not present

## 2016-03-14 DIAGNOSIS — E1121 Type 2 diabetes mellitus with diabetic nephropathy: Secondary | ICD-10-CM

## 2016-03-14 DIAGNOSIS — IMO0002 Reserved for concepts with insufficient information to code with codable children: Secondary | ICD-10-CM

## 2016-03-14 LAB — BASIC METABOLIC PANEL
BUN: 31 mg/dL — ABNORMAL HIGH (ref 6–23)
CO2: 23 mEq/L (ref 19–32)
Calcium: 9.1 mg/dL (ref 8.4–10.5)
Chloride: 107 mEq/L (ref 96–112)
Creatinine, Ser: 1.53 mg/dL — ABNORMAL HIGH (ref 0.40–1.50)
GFR: 57.98 mL/min — ABNORMAL LOW (ref >60.00)
Glucose, Bld: 200 mg/dL — ABNORMAL HIGH (ref 70–99)
Potassium: 4.3 mEq/L (ref 3.5–5.1)
Sodium: 139 mEq/L (ref 135–145)

## 2016-03-14 LAB — HEMOGLOBIN A1C: Hgb A1c MFr Bld: 9.2 % — ABNORMAL HIGH (ref 4.6–6.5)

## 2016-03-14 MED ORDER — GABAPENTIN 300 MG PO CAPS: ORAL_CAPSULE | ORAL | Status: DC

## 2016-03-14 NOTE — Patient Instructions (Signed)
Thank you for choosing [facility].  Summary/Instructions:  Your prescription(s) have been submitted to your pharmacy or been printed and provided for you. Please take as directed and contact our office if you believe you are having problem(s) with the medication(s) or have any questions.  Please stop by the lab on the basement level of the building for your blood work. Your results will be released to [place] (or called to you) after review, usually within 72 hours after test completion. If any changes need to be made, you will be notified at that same time.  If your symptoms worsen or fail to improve, please contact our office for further instruction, or in case of emergency go directly to the emergency room at the closest medical facility.   Please continue to take your medications prescribed.   We will send in a prescription to the pharmacy for your insulin following your A1c results.

## 2016-03-14 NOTE — Progress Notes (Signed)
Subjective:    Patient ID: [name] [name], male    DOB: 1946/10/06, 70 y.o.   MRN: [id number]  Chief Complaint  Patient presents with  . 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 male who  has a past medical history of Diabetes mellitus without complication ([place]) and Hypertension. and presents today For an office follow-up.  1.) Type 2 diabetes - currently maintained on NovoLog and NovoLog mix 70/30. Reports taking the medication as prescribed and denies adverse side effects or hypoglycemic readings. Significant history of noncompliance with nutrition and medication regimen. 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 Not currently testing his blood sugars at home. Will be scheduling an appointment with Dr. Zigmund [name] for his diabetic eye exam.  Lab Results  Component Value Date   HGBA1C 9.4* 11/01/2015    No Known Allergies   Current Outpatient Prescriptions on File Prior to Visit  Medication Sig Dispense Refill  . acetaminophen (TYLENOL) 325 MG tablet Take 2 tablets (650 mg total) by mouth every 6 (six) hours as needed for mild pain (or Fever >/= 101). 30 tablet 0  . alprazolam (XANAX) 2 MG tablet Take 0.5 tablets (1 mg total) by mouth 2 (two) times daily as needed for anxiety. 10 tablet 0  . amLODipine (NORVASC) 10 MG tablet Take 1 tablet (10 mg total) by mouth daily. 30 tablet 0  . aspirin EC 325 MG tablet Take 1 tablet (325 mg total) by mouth daily. 90 tablet 3  . atorvastatin (LIPITOR) 10 MG tablet Take 1 tablet (10 mg total) by mouth daily at 6 PM. (Patient taking differently: Take 10 mg by mouth daily. ) 30 tablet 0  . blood glucose meter kit and supplies Use daily to check blood sugar. DX: E11.09 1 each 0  . clopidogrel (PLAVIX) 75 MG tablet Take 75 mg by mouth  daily.    . feeding supplement, GLUCERNA SHAKE, (GLUCERNA SHAKE) LIQD Take 237 mLs by mouth 2 (two) times daily between meals. 237 mL 0  . FLUoxetine (PROZAC) 20 MG capsule Take 20 mg by mouth daily.    . folic acid (FOLVITE) 1 MG tablet Take 1 mg by mouth daily.    Marland Kitchen glucose blood (COOL BLOOD GLUCOSE TEST STRIPS) test strip Use as instructed to check blood sugar up to 3 times a day. DX: E11.09 300 each 1  . insulin aspart (NOVOLOG) 100 UNIT/ML injection Inject 3 Units into the skin 3 (three) times daily with meals. 10 mL 0  . insulin aspart protamine- aspart (NOVOLOG MIX 70/30) (70-30) 100 UNIT/ML injection Inject 0.22 mLs (22 Units total) into the skin 2 (two) times daily with a meal. 10 mL 0  . Insulin Pen Needle 31G X 5 MM MISC 1 each by Does not apply route 3 (three) times daily. 100 each 5  . Lancets MISC Use as instructed to check blood sugar up to 3 times a day. DX: E11.09 300 each 1  . levofloxacin (LEVAQUIN) 750 MG tablet Take 1 tablet (750 mg total) by mouth every evening. 6 tablet 0  . metoprolol tartrate (LOPRESSOR) 25 MG tablet Take 1 tablet (25 mg total) by mouth 2 (two) times daily. 60 tablet 0  . oxyCODONE (ROXICODONE) 15 MG immediate release tablet Take 15  mg by mouth 4 (four) times daily.    Marland Kitchen thiamine 100 MG tablet Take 1 tablet (100 mg total) by mouth daily. 30 tablet 0   No current facility-administered medications on file prior to visit.      Review of Systems  Constitutional: Negative for fever and chills.  Respiratory: Negative for chest tightness and shortness of breath.   Cardiovascular: Negative for chest pain, palpitations and leg swelling.  Endocrine: Negative for polydipsia, polyphagia and polyuria.  Neurological: Negative for dizziness, weakness and headaches.      Objective:    BP 152/92 mmHg  Pulse 79  Temp(Src) 98.3 F (36.8 C) (Oral)  Resp 16  Ht '5\' 9"'  (1.753 m)  Wt 156 lb (70.761 kg)  BMI 23.03 kg/m2  SpO2 96% Nursing note and vital signs  reviewed.  Physical Exam  Constitutional: He is oriented to person, place, and time. He appears well-developed and well-nourished. No distress.  Cardiovascular: Normal rate, regular rhythm, normal heart sounds and intact distal pulses.   Pulmonary/Chest: Effort normal and breath sounds normal.  Musculoskeletal:  Diabetic Foot Exam - Simple   Simple Foot Form  Diabetic Foot exam was performed with the following findings:  Yes  03/14/2016  4:16 PM  Visual Inspection  No deformities, no ulcerations, no other skin breakdown bilaterally:  Yes  Sensation Testing  See comments:  Yes  Pulse Check  Posterior Tibialis and Dorsalis pulse intact bilaterally:  Yes  Comments  Decreased sensation to monofilament noted in distal aspects of bilateral  feet.   Neurological: He is alert and oriented to person, place, and time.  Skin: Skin is warm and dry.  Psychiatric: He has a normal mood and affect. His behavior is normal. Judgment and thought content normal.       Assessment & Plan:   Problem List Items Addressed This Visit      Endocrine   Uncontrolled type 2 diabetes mellitus with diabetic nephropathy, with long-term current use of insulin ([place]) - Primary (Chronic)    Type 2 diabetes with poor control with most recent A1c of 9.4. Obtain Y2Q, basic metabolic panel, and urine microalbumin. Question patient compliance and ability to complete monitoring blood sugar. Continue current dosage of NovoLog and NovoLog mix 70/30 pending A1c results. Start gabapentin for diabetic neuropathy. Pneumovax updated today. Diabetic foot exam completed. Encouraged to complete diabetic eye exam independently. Currently maintained on atorvastatin for CAD risk reduction.      Relevant Medications   gabapentin (NEURONTIN) 300 MG capsule   Other Relevant Orders   Hemoglobin A1c   Urine Microalbumin w/creat. ratio   Basic Metabolic Panel (BMET)    Other Visit Diagnoses    Need for 23-polyvalent pneumococcal  polysaccharide vaccine        Relevant Orders    Pneumococcal polysaccharide vaccine 23-valent greater than or equal to 2yo subcutaneous/IM (Completed)        I am having [name] [name] start on gabapentin. I am also having him maintain his FLUoxetine, atorvastatin, aspirin EC, folic acid, acetaminophen, feeding supplement (GLUCERNA SHAKE), amLODipine, alprazolam, clopidogrel, insulin aspart, insulin aspart protamine- aspart, Insulin Pen Needle, blood glucose meter kit and supplies, glucose blood, Lancets, oxyCODONE, levofloxacin, thiamine, and metoprolol tartrate.   Meds ordered this encounter  Medications  . gabapentin (NEURONTIN) 300 MG capsule    Sig: Take 1 tablet by mouth daily for 1 day then 1 tablet by mouth twice daily for 1 day and then 1 tablet by mouth 3 times daily.  Dispense:  90 capsule    Refill:  1    Order Specific Question:  Supervising Provider    Answer:  Pricilla Holm A [[id number]]     Follow-up: Return in about 1 month (around 04/13/2016) for Diabetes.   Mauricio Po, [name]

## 2016-03-14 NOTE — Assessment & Plan Note (Addendum)
Type 2 diabetes with poor control with most recent A1c of 9.4. Obtain [id number], basic metabolic panel, and urine microalbumin. Question patient compliance and ability to complete monitoring blood sugar. Continue current dosage of NovoLog and NovoLog mix 70/30 pending A1c results. Start gabapentin for diabetic neuropathy. Pneumovax updated today. Diabetic foot exam completed. Encouraged to complete diabetic eye exam independently. Currently maintained on atorvastatin for CAD risk reduction.

## 2016-03-14 NOTE — Progress Notes (Signed)
Pre visit review using our clinic review tool, if applicable. No additional management support is needed unless otherwise documented below in the visit note. 

## 2016-03-17 ENCOUNTER — Telehealth: Payer: Self-pay | Admitting: Family

## 2016-03-17 MED ORDER — INSULIN ASPART PROT & ASPART (70-30 MIX) 100 UNIT/ML PEN: 27.0000 [IU] | PEN_INJECTOR | Freq: Two times a day (BID) | SUBCUTANEOUS | Status: DC

## 2016-03-17 MED ORDER — INSULIN PEN NEEDLE 31G X 5 MM MISC: 1.0000 | Freq: Three times a day (TID) | Status: DC

## 2016-03-17 MED ORDER — INSULIN ASPART 100 UNIT/ML FLEXPEN: 3.0000 [IU] | PEN_INJECTOR | Freq: Three times a day (TID) | SUBCUTANEOUS | Status: DC

## 2016-03-17 NOTE — Telephone Encounter (Signed)
Please inform patient that his blood work shows that his kidney function has decreased most likely related to his uncontrolled blood sugars. His A1c is 9.2. Please have him increase his 70/30 to 27 units twice daily. Continue the Novlog at 3 units. I have sent new prescriptions for pens and needles to his pharmacy. Please have him follow up in 1 month.

## 2016-03-19 NOTE — Telephone Encounter (Signed)
Pt aware of results 

## 2016-04-18 ENCOUNTER — Other Ambulatory Visit: Payer: Self-pay

## 2016-04-18 MED ORDER — GLUCOSE BLOOD VI STRP: ORAL_STRIP | Status: DC

## 2016-04-24 ENCOUNTER — Other Ambulatory Visit: Payer: Self-pay

## 2016-04-24 MED ORDER — BLOOD GLUCOSE METER KIT: PACK | Status: DC

## 2016-04-24 MED ORDER — GLUCOSE BLOOD VI STRP: ORAL_STRIP | Status: DC

## 2016-05-08 ENCOUNTER — Inpatient Hospital Stay (HOSPITAL_COMMUNITY)
Admission: EM | Admit: 2016-05-08 | Discharge: 2016-05-12 | DRG: 638 | Disposition: A | Discharge status: Home Health | Payer: Medicare HMO | Attending: Internal Medicine | Admitting: Internal Medicine

## 2016-05-08 ENCOUNTER — Encounter (HOSPITAL_COMMUNITY): Payer: Self-pay

## 2016-05-08 DIAGNOSIS — E869 Volume depletion, unspecified: Secondary | ICD-10-CM | POA: Diagnosis present

## 2016-05-08 DIAGNOSIS — Z820 Family history of epilepsy and other diseases of the nervous system: Secondary | ICD-10-CM

## 2016-05-08 DIAGNOSIS — E1122 Type 2 diabetes mellitus with diabetic chronic kidney disease: Secondary | ICD-10-CM | POA: Diagnosis present

## 2016-05-08 DIAGNOSIS — E1010 Type 1 diabetes mellitus with ketoacidosis without coma: Secondary | ICD-10-CM

## 2016-05-08 DIAGNOSIS — Z96641 Presence of right artificial hip joint: Secondary | ICD-10-CM | POA: Diagnosis present

## 2016-05-08 DIAGNOSIS — N183 Chronic kidney disease, stage 3 (moderate): Secondary | ICD-10-CM | POA: Diagnosis present

## 2016-05-08 DIAGNOSIS — N189 Chronic kidney disease, unspecified: Secondary | ICD-10-CM

## 2016-05-08 DIAGNOSIS — F419 Anxiety disorder, unspecified: Secondary | ICD-10-CM | POA: Diagnosis present

## 2016-05-08 DIAGNOSIS — Z7902 Long term (current) use of antithrombotics/antiplatelets: Secondary | ICD-10-CM

## 2016-05-08 DIAGNOSIS — R112 Nausea with vomiting, unspecified: Secondary | ICD-10-CM | POA: Diagnosis not present

## 2016-05-08 DIAGNOSIS — I129 Hypertensive chronic kidney disease with stage 1 through stage 4 chronic kidney disease, or unspecified chronic kidney disease: Secondary | ICD-10-CM | POA: Diagnosis present

## 2016-05-08 DIAGNOSIS — E1110 Type 2 diabetes mellitus with ketoacidosis without coma: Secondary | ICD-10-CM | POA: Diagnosis present

## 2016-05-08 DIAGNOSIS — E785 Hyperlipidemia, unspecified: Secondary | ICD-10-CM | POA: Diagnosis present

## 2016-05-08 DIAGNOSIS — F329 Major depressive disorder, single episode, unspecified: Secondary | ICD-10-CM | POA: Diagnosis present

## 2016-05-08 DIAGNOSIS — Z8673 Personal history of transient ischemic attack (TIA), and cerebral infarction without residual deficits: Secondary | ICD-10-CM

## 2016-05-08 DIAGNOSIS — E1310 Other specified diabetes mellitus with ketoacidosis without coma: Secondary | ICD-10-CM | POA: Diagnosis not present

## 2016-05-08 DIAGNOSIS — Z87891 Personal history of nicotine dependence: Secondary | ICD-10-CM

## 2016-05-08 DIAGNOSIS — Z833 Family history of diabetes mellitus: Secondary | ICD-10-CM

## 2016-05-08 DIAGNOSIS — Z79899 Other long term (current) drug therapy: Secondary | ICD-10-CM

## 2016-05-08 DIAGNOSIS — I10 Essential (primary) hypertension: Secondary | ICD-10-CM | POA: Diagnosis present

## 2016-05-08 DIAGNOSIS — Z794 Long term (current) use of insulin: Secondary | ICD-10-CM

## 2016-05-08 DIAGNOSIS — Z7982 Long term (current) use of aspirin: Secondary | ICD-10-CM

## 2016-05-08 DIAGNOSIS — N179 Acute kidney failure, unspecified: Secondary | ICD-10-CM | POA: Diagnosis present

## 2016-05-08 DIAGNOSIS — Z8249 Family history of ischemic heart disease and other diseases of the circulatory system: Secondary | ICD-10-CM

## 2016-05-08 LAB — BASIC METABOLIC PANEL
Anion gap: 26 — ABNORMAL HIGH (ref 5–15)
BUN: 37 mg/dL — ABNORMAL HIGH (ref 6–20)
CO2: 11 mmol/L — ABNORMAL LOW (ref 22–32)
Calcium: 8.7 mg/dL — ABNORMAL LOW (ref 8.9–10.3)
Chloride: 85 mmol/L — ABNORMAL LOW (ref 101–111)
Creatinine, Ser: 2.8 mg/dL — ABNORMAL HIGH (ref 0.61–1.24)
GFR calc Af Amer: 25 mL/min — ABNORMAL LOW (ref >60)
GFR calc non Af Amer: 21 mL/min — ABNORMAL LOW (ref >60)
Glucose, Bld: 888 mg/dL (ref 65–99)
Potassium: 4.9 mmol/L (ref 3.5–5.1)
Sodium: 122 mmol/L — ABNORMAL LOW (ref 135–145)

## 2016-05-08 LAB — CBC
HCT: 37.7 % — ABNORMAL LOW (ref 39.0–52.0)
Hemoglobin: 12.4 g/dL — ABNORMAL LOW (ref 13.0–17.0)
MCH: 30.2 pg (ref 26.0–34.0)
MCHC: 32.9 g/dL (ref 30.0–36.0)
MCV: 92 fL (ref 78.0–100.0)
Platelets: 232 10*3/uL (ref 150–400)
RBC: 4.1 MIL/uL — ABNORMAL LOW (ref 4.22–5.81)
RDW: 13 % (ref 11.5–15.5)
WBC: 14.8 10*3/uL — ABNORMAL HIGH (ref 4.0–10.5)

## 2016-05-08 LAB — URINE MICROSCOPIC-ADD ON: Bacteria, UA: NONE SEEN

## 2016-05-08 LAB — URINALYSIS, ROUTINE W REFLEX MICROSCOPIC
Bilirubin Urine: NEGATIVE
Glucose, UA: >1000 mg/dL — AB
Ketones, ur: 40 mg/dL — AB
Leukocytes, UA: NEGATIVE
Nitrite: NEGATIVE
Protein, ur: NEGATIVE mg/dL
Specific Gravity, Urine: 1.026 (ref 1.005–1.030)
pH: 5 (ref 5.0–8.0)

## 2016-05-08 LAB — CBG MONITORING, ED: Glucose-Capillary: >600 mg/dL (ref 65–99)

## 2016-05-08 MED ORDER — SODIUM CHLORIDE 0.9 % IV BOLUS (SEPSIS)
1000.0000 mL | Freq: Once | INTRAVENOUS | Status: AC
  Administered 2016-05-08: 1000 mL via INTRAVENOUS

## 2016-05-08 MED ORDER — SODIUM CHLORIDE 0.9 % IV BOLUS (SEPSIS)
1000.0000 mL | Freq: Once | INTRAVENOUS | Status: AC
  Administered 2016-05-09: 1000 mL via INTRAVENOUS

## 2016-05-08 MED ORDER — SODIUM CHLORIDE 0.9 % IV SOLN
INTRAVENOUS | Status: DC
  Administered 2016-05-08: 5.4 [IU]/h via INTRAVENOUS
  Filled 2016-05-08: qty 2.5

## 2016-05-08 MED ORDER — SODIUM CHLORIDE 0.9 % IV SOLN: INTRAVENOUS | Status: DC

## 2016-05-08 MED ORDER — DEXTROSE-NACL 5-0.45 % IV SOLN: INTRAVENOUS | Status: DC

## 2016-05-08 NOTE — ED Triage Notes (Signed)
Patient arrives by EMS with complaints of elevated blood sugar >600.  Per EMS, sister called EMS, patient lives by himself. Sister came to re-check patient's blood sugar because it ws high during the day. Patient took 20 units of ? Insulin prior to EMS arrival

## 2016-05-08 NOTE — ED Notes (Signed)
Family Contact: Dyanne Carrel [phone]

## 2016-05-08 NOTE — ED Provider Notes (Signed)
[facility] DEPT Provider Note   CSN: [id number] Arrival date & time: 05/08/16  2209  By signing my name below, I, Emmanuella Mensah, attest that this documentation has been prepared under the direction and in the presence of Narada Uzzle, MD. Electronically Signed: Judithann Sauger, ED Scribe. 05/08/16. 11:15 PM.   History   Chief Complaint Chief Complaint  Patient presents with  . Hyperglycemia    HPI Comments: [name] [name] is a 70 y.o. male with a hx of hypertension and DM brought in by ambulance, who presents to the Emergency Department for evaluation s/p elevated blood sugar (>600 PTA) today. He reports associated one episode of non-bloody vomiting and generalized fatigue. Pt explains that he has been non-complaint with his insulin medication for four doses although he took it today. He denies any abdominal pain, diarrhea, or headache. No other complaints at this time.    The history is provided by the patient. No language interpreter was used.  Hyperglycemia   This is a recurrent problem. The current episode started yesterday. The problem has not changed since onset.He has vomited 1 time. Pertinent negatives include no slurred speech. Other household meds: insulin.    Past Medical History:  Diagnosis Date  . Diabetes mellitus without complication ([place])   . Hypertension     Patient Active Problem List   Diagnosis Date Noted  . Chronic prescription benzodiazepine use 11/02/2015  . Narcotic dependency, continuous ([place]) 11/02/2015  . Uncontrolled type 2 diabetes mellitus with diabetic nephropathy, with long-term current use of insulin ([place]) 08/12/2015  . Benign essential HTN 08/12/2015  . Dyslipidemia associated with type 2 diabetes mellitus ([name]) 08/12/2015  . Depression 08/12/2015  . 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 CKD (chronic kidney disease) stage 3, GFR 30-59 ml/min 03/20/2014  . Anemia of chronic disease  02/02/2013    Past Surgical History:  Procedure Laterality Date  . HIP ARTHROPLASTY Right 02/05/2013   Procedure: ARTHROPLASTY BIPOLAR HIP;  Surgeon: [name], MD;  Location: [facility] ORS;  Service: Orthopedics;  Laterality: Right;       Home Medications    Prior to Admission medications   Medication Sig Start Date End Date Taking? Authorizing Provider  alprazolam [name]) 2 MG tablet Take 0.5 tablets (1 mg total) by mouth 2 (two) times daily as needed for anxiety. 08/24/15  Yes Verlee Monte, MD  amLODipine (NORVASC) 10 MG tablet Take 1 tablet (10 mg total) by mouth daily. 08/14/15  Yes Robbie Lis, MD  aspirin EC 325 MG tablet Take 1 tablet (325 mg total) by mouth daily. 06/08/15  Yes Rosalin Hawking, MD  atorvastatin (LIPITOR) 10 MG tablet Take 1 tablet (10 mg total) by mouth daily at 6 PM. Patient taking differently: Take 10 mg by mouth daily.  03/23/15  Yes [name], DO  clopidogrel (PLAVIX) 75 MG tablet Take 75 mg by mouth daily.   Yes Historical Provider, MD  folic acid (FOLVITE) 1 MG tablet Take 1 mg by mouth daily.   Yes Historical Provider, MD  insulin aspart (NOVOLOG) 100 UNIT/ML FlexPen Inject 3 Units into the skin 3 (three) times daily with meals. 03/17/16  Yes Golden Circle, [name]  insulin aspart protamine - aspart (NOVOLOG 70/30 MIX) (70-30) 100 UNIT/ML FlexPen Inject 0.27 mLs (27 Units total) into the skin 2 (two) times daily. 03/17/16  Yes Golden Circle, [name]  metoprolol tartrate (LOPRESSOR) 25 MG tablet Take 1 tablet (25 mg total) by mouth  2 (two) times daily. 11/03/15  Yes Simbiso Ranga, MD  acetaminophen (TYLENOL) 325 MG tablet Take 2 tablets (650 mg total) by mouth every 6 (six) hours as needed for mild pain (or Fever >/= 101). Patient not taking: Reported on 05/08/2016 08/14/15   Robbie Lis, MD  blood glucose meter kit and supplies Use daily to check blood sugar. DX: E11.09 04/24/16   Golden Circle, [name]  feeding supplement, GLUCERNA SHAKE, (GLUCERNA SHAKE) LIQD Take  237 mLs by mouth 2 (two) times daily between meals. Patient not taking: Reported on 05/08/2016 08/14/15   Robbie Lis, MD  FLUoxetine (PROZAC) 20 MG capsule Take 20 mg by mouth daily.    Historical Provider, MD  gabapentin (NEURONTIN) 300 MG capsule Take 1 tablet by mouth daily for 1 day then 1 tablet by mouth twice daily for 1 day and then 1 tablet by mouth 3 times daily. Patient not taking: Reported on 05/08/2016 03/14/16   Golden Circle, [name]  glucose blood (COOL BLOOD GLUCOSE TEST STRIPS) test strip Use as instructed to check blood sugar up to 3 times a day. DX: E11.09 04/24/16   Golden Circle, [name]  Insulin Pen Needle 31G X 5 MM MISC 1 each by Does not apply route 3 (three) times daily. 03/17/16   Golden Circle, [name]  Lancets MISC Use as instructed to check blood sugar up to 3 times a day. DX: E11.09 10/16/15   Golden Circle, [name]  levofloxacin (LEVAQUIN) 750 MG tablet Take 1 tablet (750 mg total) by mouth every evening. Patient not taking: Reported on 05/08/2016 11/03/15   Nat Math, MD  thiamine 100 MG tablet Take 1 tablet (100 mg total) by mouth daily. Patient not taking: Reported on 05/08/2016 11/03/15   Nat Math, MD    Family History Family History  Problem Relation Age of Onset  . Diabetes Mother   . Alzheimer's disease Mother   . Hypertension Mother   . Hyperlipidemia Father   . Hypertension Father   . Healthy Maternal Grandmother   . Pneumonia Maternal Grandfather     Social History Social History  Substance Use Topics  . Smoking status: Former Smoker    Packs/day: 0.25    Years: 30.00    Types: Cigarettes    Quit date: 01/31/2013  . Smokeless tobacco: Never Used  . Alcohol use No     Allergies   Review of patient's allergies indicates no known allergies.   Review of Systems Review of Systems  Constitutional: Negative.   Gastrointestinal: Positive for vomiting. Negative for abdominal pain.  Endocrine: Positive for polydipsia.  Skin: Negative for color  change.  All other systems reviewed and are negative.    Physical Exam Updated Vital Signs BP 139/64 (BP Location: Left Arm)   Pulse 88   Temp 98.4 F (36.9 C) (Oral)   Resp 22   SpO2 98%   Physical Exam  Constitutional: He is oriented to person, place, and time. He appears well-developed and well-nourished. No distress.  HENT:  Head: Normocephalic and atraumatic. Head is without raccoon's eyes and without Battle's sign.  Mouth/Throat: Oropharynx is clear and moist. No oropharyngeal exudate.  Abrasion to bridge of nose No septal hematoma  Eyes: Conjunctivae and EOM are normal. Pupils are equal, round, and reactive to light.  Neck: Normal range of motion. Neck supple. No tracheal deviation present.  Cardiovascular: Normal rate and regular rhythm.   Pulmonary/Chest: Effort normal and breath sounds normal. No respiratory distress.  Abdominal: Soft. Bowel sounds are normal.  No signs of trauma to abdomen  Musculoskeletal: Normal range of motion.  No step-off or crepitus of the C-spine No signs of trauma to extremities Cap refill less than 3 sec  Neurological: He is alert and oriented to person, place, and time. He displays normal reflexes.  Good DTRs  Skin: Skin is warm and dry. Capillary refill takes less than 2 seconds.  Psychiatric: He has a normal mood and affect. His behavior is normal.  Nursing note and vitals reviewed.    ED Treatments / Results  DIAGNOSTIC STUDIES: Oxygen Saturation is 98% on RA, normal by my interpretation.    COORDINATION OF CARE: 11:15 PM- Pt advised of plan for treatment and pt agrees. Pt will receive IV fluids and insulin.    Labs (all labs ordered are listed, but only abnormal results are displayed) Labs Reviewed  CBC - Abnormal; Notable for the following:       Result Value   WBC 14.8 (*)    RBC 4.10 (*)    Hemoglobin 12.4 (*)    HCT 37.7 (*)    All other components within normal limits  CBG MONITORING, ED - Abnormal; Notable for the  following:    Glucose-Capillary >600 (*)    All other components within normal limits  BASIC METABOLIC PANEL  URINALYSIS, ROUTINE W REFLEX MICROSCOPIC (NOT AT [facility])    EKG  EKG Interpretation None       Radiology No results found.  Procedures Procedures (including critical care time)  Medications Ordered in ED Medications - No data to display   Initial Impression / Assessment and Plan / ED Course  Lezli Danek, MD has reviewed the triage vital signs and the nursing notes.  Pertinent labs & imaging results that were available during my care of the patient were reviewed by me and considered in my medical decision making (see chart for details).  Clinical Course    Vitals:   05/08/16 2350 05/09/16 0000  BP: 134/61 135/69  Pulse: 81 78  Resp: 18 (!) 32  Temp:     Results for orders placed or performed during the hospital encounter of 83/41/96  Basic metabolic panel  Result Value Ref Range   Sodium 122 (L) 135 - 145 mmol/L   Potassium 4.9 3.5 - 5.1 mmol/L   Chloride 85 (L) 101 - 111 mmol/L   CO2 11 (L) 22 - 32 mmol/L   Glucose, Bld 888 (HH) 65 - 99 mg/dL   BUN 37 (H) 6 - 20 mg/dL   Creatinine, Ser 2.80 (H) 0.61 - 1.24 mg/dL   Calcium 8.7 (L) 8.9 - 10.3 mg/dL   GFR calc non Af Amer 21 (L) >60 mL/min   GFR calc Af Amer 25 (L) >60 mL/min   Anion gap 26 (H) 5 - 15  CBC  Result Value Ref Range   WBC 14.8 (H) 4.0 - 10.5 K/uL   RBC 4.10 (L) 4.22 - 5.81 MIL/uL   Hemoglobin 12.4 (L) 13.0 - 17.0 g/dL   HCT 37.7 (L) 39.0 - 52.0 %   MCV 92.0 78.0 - 100.0 fL   MCH 30.2 26.0 - 34.0 pg   MCHC 32.9 30.0 - 36.0 g/dL   RDW 13.0 11.5 - 15.5 %   Platelets 232 150 - 400 K/uL  Urinalysis, Routine w reflex microscopic  Result Value Ref Range   Color, Urine YELLOW YELLOW   APPearance CLEAR CLEAR   Specific Gravity, Urine 1.026 1.005 - 1.030  pH 5.0 5.0 - 8.0   Glucose, UA >1000 (A) NEGATIVE mg/dL   Hgb urine dipstick TRACE (A) NEGATIVE   Bilirubin Urine NEGATIVE NEGATIVE     Ketones, ur 40 (A) NEGATIVE mg/dL   Protein, ur NEGATIVE NEGATIVE mg/dL   Nitrite NEGATIVE NEGATIVE   Leukocytes, UA NEGATIVE NEGATIVE  Urine microscopic-add on  Result Value Ref Range   Squamous Epithelial / LPF 0-5 (A) NONE SEEN   WBC, UA 0-5 0 - 5 WBC/hpf   RBC / HPF 0-5 0 - 5 RBC/hpf   Bacteria, UA NONE SEEN NONE SEEN  CBG monitoring, ED  Result Value Ref Range   Glucose-Capillary >600 (HH) 65 - 99 mg/dL   No results found. Medications  insulin regular (NOVOLIN R,HUMULIN R) 250 Units in sodium chloride 0.9 % 250 mL (1 Units/mL) infusion (5.4 Units/hr Intravenous New Bag/Given 05/08/16 2353)  sodium chloride 0.9 % bolus 1,000 mL (1,000 mLs Intravenous New Bag/Given 05/08/16 2328)    And  sodium chloride 0.9 % bolus 1,000 mL (not administered)    And  0.9 %  sodium chloride infusion (not administered)  dextrose 5 %-0.45 % sodium chloride infusion (not administered)  sodium chloride 0.9 % bolus 1,000 mL (1,000 mLs Intravenous New Bag/Given 05/08/16 2329)     Final Clinical Impressions(s) / ED Diagnoses   Final diagnoses:  None   DKA  Plan admit to medicine for stabilization New Prescriptions New Prescriptions   No medications on file   I personally performed the services described in this documentation, which was scribed in my presence. The recorded information has been reviewed and is accurate.      Veatrice Kells, MD 05/09/16 [phone]

## 2016-05-08 NOTE — ED Notes (Signed)
Bed: RESB Expected date:  Expected time:  Means of arrival:  Comments: EMS 70 yo male hyperglycemia

## 2016-05-09 ENCOUNTER — Inpatient Hospital Stay (HOSPITAL_COMMUNITY): Payer: Medicare HMO

## 2016-05-09 ENCOUNTER — Encounter (HOSPITAL_COMMUNITY): Payer: Self-pay | Admitting: Emergency Medicine

## 2016-05-09 DIAGNOSIS — Z820 Family history of epilepsy and other diseases of the nervous system: Secondary | ICD-10-CM | POA: Diagnosis not present

## 2016-05-09 DIAGNOSIS — E1010 Type 1 diabetes mellitus with ketoacidosis without coma: Secondary | ICD-10-CM | POA: Diagnosis not present

## 2016-05-09 DIAGNOSIS — N183 Chronic kidney disease, stage 3 (moderate): Secondary | ICD-10-CM | POA: Diagnosis present

## 2016-05-09 DIAGNOSIS — Z794 Long term (current) use of insulin: Secondary | ICD-10-CM | POA: Diagnosis not present

## 2016-05-09 DIAGNOSIS — N189 Chronic kidney disease, unspecified: Secondary | ICD-10-CM

## 2016-05-09 DIAGNOSIS — I10 Essential (primary) hypertension: Secondary | ICD-10-CM

## 2016-05-09 DIAGNOSIS — Z8249 Family history of ischemic heart disease and other diseases of the circulatory system: Secondary | ICD-10-CM | POA: Diagnosis not present

## 2016-05-09 DIAGNOSIS — N179 Acute kidney failure, unspecified: Secondary | ICD-10-CM

## 2016-05-09 DIAGNOSIS — Z833 Family history of diabetes mellitus: Secondary | ICD-10-CM | POA: Diagnosis not present

## 2016-05-09 DIAGNOSIS — E1110 Type 2 diabetes mellitus with ketoacidosis without coma: Secondary | ICD-10-CM | POA: Diagnosis present

## 2016-05-09 DIAGNOSIS — E0810 Diabetes mellitus due to underlying condition with ketoacidosis without coma: Secondary | ICD-10-CM

## 2016-05-09 DIAGNOSIS — Z87891 Personal history of nicotine dependence: Secondary | ICD-10-CM | POA: Diagnosis not present

## 2016-05-09 DIAGNOSIS — E869 Volume depletion, unspecified: Secondary | ICD-10-CM | POA: Diagnosis present

## 2016-05-09 DIAGNOSIS — E1310 Other specified diabetes mellitus with ketoacidosis without coma: Secondary | ICD-10-CM | POA: Diagnosis present

## 2016-05-09 DIAGNOSIS — F419 Anxiety disorder, unspecified: Secondary | ICD-10-CM | POA: Diagnosis present

## 2016-05-09 DIAGNOSIS — Z7982 Long term (current) use of aspirin: Secondary | ICD-10-CM | POA: Diagnosis not present

## 2016-05-09 DIAGNOSIS — I129 Hypertensive chronic kidney disease with stage 1 through stage 4 chronic kidney disease, or unspecified chronic kidney disease: Secondary | ICD-10-CM | POA: Diagnosis present

## 2016-05-09 DIAGNOSIS — E785 Hyperlipidemia, unspecified: Secondary | ICD-10-CM | POA: Diagnosis present

## 2016-05-09 DIAGNOSIS — F329 Major depressive disorder, single episode, unspecified: Secondary | ICD-10-CM | POA: Diagnosis present

## 2016-05-09 DIAGNOSIS — R112 Nausea with vomiting, unspecified: Secondary | ICD-10-CM | POA: Diagnosis present

## 2016-05-09 DIAGNOSIS — E1122 Type 2 diabetes mellitus with diabetic chronic kidney disease: Secondary | ICD-10-CM | POA: Diagnosis present

## 2016-05-09 DIAGNOSIS — Z7902 Long term (current) use of antithrombotics/antiplatelets: Secondary | ICD-10-CM | POA: Diagnosis not present

## 2016-05-09 DIAGNOSIS — Z8673 Personal history of transient ischemic attack (TIA), and cerebral infarction without residual deficits: Secondary | ICD-10-CM | POA: Diagnosis not present

## 2016-05-09 DIAGNOSIS — Z96641 Presence of right artificial hip joint: Secondary | ICD-10-CM | POA: Diagnosis present

## 2016-05-09 DIAGNOSIS — Z79899 Other long term (current) drug therapy: Secondary | ICD-10-CM | POA: Diagnosis not present

## 2016-05-09 HISTORY — DX: Type 2 diabetes mellitus with ketoacidosis without coma: E11.10

## 2016-05-09 LAB — CBG MONITORING, ED
Glucose-Capillary: 549 mg/dL (ref 65–99)
Glucose-Capillary: 592 mg/dL (ref 65–99)

## 2016-05-09 LAB — BASIC METABOLIC PANEL
Anion gap: 8 (ref 5–15)
BUN: 31 mg/dL — ABNORMAL HIGH (ref 6–20)
CO2: 23 mmol/L (ref 22–32)
Calcium: 8.1 mg/dL — ABNORMAL LOW (ref 8.9–10.3)
Chloride: 103 mmol/L (ref 101–111)
Creatinine, Ser: 2.11 mg/dL — ABNORMAL HIGH (ref 0.61–1.24)
GFR calc Af Amer: 35 mL/min — ABNORMAL LOW (ref >60)
GFR calc non Af Amer: 30 mL/min — ABNORMAL LOW (ref >60)
Glucose, Bld: 308 mg/dL — ABNORMAL HIGH (ref 65–99)
Potassium: 3.8 mmol/L (ref 3.5–5.1)
Sodium: 134 mmol/L — ABNORMAL LOW (ref 135–145)

## 2016-05-09 LAB — GLUCOSE, CAPILLARY
Glucose-Capillary: 380 mg/dL — ABNORMAL HIGH (ref 65–99)
Glucose-Capillary: 312 mg/dL — ABNORMAL HIGH (ref 65–99)
Glucose-Capillary: 214 mg/dL — ABNORMAL HIGH (ref 65–99)
Glucose-Capillary: 152 mg/dL — ABNORMAL HIGH (ref 65–99)
Glucose-Capillary: 104 mg/dL — ABNORMAL HIGH (ref 65–99)
Glucose-Capillary: 143 mg/dL — ABNORMAL HIGH (ref 65–99)
Glucose-Capillary: 64 mg/dL — ABNORMAL LOW (ref 65–99)
Glucose-Capillary: 103 mg/dL — ABNORMAL HIGH (ref 65–99)
Glucose-Capillary: 63 mg/dL — ABNORMAL LOW (ref 65–99)
Glucose-Capillary: 129 mg/dL — ABNORMAL HIGH (ref 65–99)
Glucose-Capillary: 153 mg/dL — ABNORMAL HIGH (ref 65–99)

## 2016-05-09 LAB — MRSA PCR SCREENING: MRSA by PCR: NEGATIVE

## 2016-05-09 MED ORDER — CLOPIDOGREL BISULFATE 75 MG PO TABS
75.0000 mg | ORAL_TABLET | Freq: Every day | ORAL | Status: DC
  Administered 2016-05-09 – 2016-05-12 (×4): 75 mg via ORAL
  Filled 2016-05-09 (×4): qty 1

## 2016-05-09 MED ORDER — FOLIC ACID 1 MG PO TABS
1.0000 mg | ORAL_TABLET | Freq: Every day | ORAL | Status: DC
  Administered 2016-05-09 – 2016-05-12 (×4): 1 mg via ORAL
  Filled 2016-05-09 (×4): qty 1

## 2016-05-09 MED ORDER — ALPRAZOLAM 0.5 MG PO TABS
0.5000 mg | ORAL_TABLET | Freq: Every day | ORAL | Status: DC
  Administered 2016-05-09 – 2016-05-11 (×3): 0.5 mg via ORAL
  Filled 2016-05-09 (×3): qty 1

## 2016-05-09 MED ORDER — SODIUM CHLORIDE 0.9 % IV SOLN: INTRAVENOUS | Status: DC

## 2016-05-09 MED ORDER — INSULIN ASPART 100 UNIT/ML ~~LOC~~ SOLN: 0.0000 [IU] | Freq: Every day | SUBCUTANEOUS | Status: DC

## 2016-05-09 MED ORDER — METOPROLOL TARTRATE 25 MG PO TABS
25.0000 mg | ORAL_TABLET | Freq: Two times a day (BID) | ORAL | Status: DC
  Administered 2016-05-09 – 2016-05-12 (×5): 25 mg via ORAL
  Filled 2016-05-09 (×7): qty 1

## 2016-05-09 MED ORDER — INSULIN ASPART 100 UNIT/ML ~~LOC~~ SOLN
0.0000 [IU] | Freq: Three times a day (TID) | SUBCUTANEOUS | Status: DC
  Administered 2016-05-10: 2 [IU] via SUBCUTANEOUS
  Administered 2016-05-11: 8 [IU] via SUBCUTANEOUS

## 2016-05-09 MED ORDER — ASPIRIN EC 325 MG PO TBEC
325.0000 mg | DELAYED_RELEASE_TABLET | Freq: Every day | ORAL | Status: DC
  Administered 2016-05-09 – 2016-05-12 (×4): 325 mg via ORAL
  Filled 2016-05-09 (×4): qty 1

## 2016-05-09 MED ORDER — SODIUM CHLORIDE 0.9 % IV SOLN
INTRAVENOUS | Status: AC
  Administered 2016-05-09 (×2): via INTRAVENOUS

## 2016-05-09 MED ORDER — SODIUM CHLORIDE 0.9 % IV SOLN
INTRAVENOUS | Status: DC
  Administered 2016-05-09: 03:00:00 via INTRAVENOUS

## 2016-05-09 MED ORDER — POTASSIUM CHLORIDE 10 MEQ/100ML IV SOLN
10.0000 meq | INTRAVENOUS | Status: AC
  Administered 2016-05-09 (×2): 10 meq via INTRAVENOUS
  Filled 2016-05-09 (×2): qty 100

## 2016-05-09 MED ORDER — FLUOXETINE HCL 20 MG PO CAPS
20.0000 mg | ORAL_CAPSULE | Freq: Every day | ORAL | Status: DC
Start: 2016-05-09 — End: 2016-05-12
  Administered 2016-05-09 – 2016-05-12 (×4): 20 mg via ORAL
  Filled 2016-05-09 (×4): qty 1

## 2016-05-09 MED ORDER — POTASSIUM CHLORIDE CRYS ER 20 MEQ PO TBCR
20.0000 meq | EXTENDED_RELEASE_TABLET | Freq: Once | ORAL | Status: AC
  Administered 2016-05-09: 20 meq via ORAL
  Filled 2016-05-09: qty 1

## 2016-05-09 MED ORDER — INSULIN ASPART PROT & ASPART (70-30 MIX) 100 UNIT/ML ~~LOC~~ SUSP
10.0000 [IU] | Freq: Once | SUBCUTANEOUS | Status: AC
  Administered 2016-05-09: 10 [IU] via SUBCUTANEOUS

## 2016-05-09 MED ORDER — ENOXAPARIN SODIUM 30 MG/0.3ML ~~LOC~~ SOLN
30.0000 mg | SUBCUTANEOUS | Status: DC
  Administered 2016-05-09 – 2016-05-10 (×2): 30 mg via SUBCUTANEOUS
  Filled 2016-05-09 (×2): qty 0.3

## 2016-05-09 MED ORDER — SODIUM CHLORIDE 0.9 % IV SOLN
INTRAVENOUS | Status: DC
  Administered 2016-05-09: 9.6 [IU]/h via INTRAVENOUS
  Filled 2016-05-09: qty 2.5

## 2016-05-09 MED ORDER — SODIUM CHLORIDE 0.9 % IV SOLN
INTRAVENOUS | Status: DC
  Administered 2016-05-09 (×2): via INTRAVENOUS

## 2016-05-09 MED ORDER — ATORVASTATIN CALCIUM 10 MG PO TABS
10.0000 mg | ORAL_TABLET | Freq: Every day | ORAL | Status: DC
  Administered 2016-05-09 – 2016-05-12 (×4): 10 mg via ORAL
  Filled 2016-05-09 (×4): qty 1

## 2016-05-09 MED ORDER — DEXTROSE-NACL 5-0.45 % IV SOLN
INTRAVENOUS | Status: DC
  Administered 2016-05-09: 05:00:00 via INTRAVENOUS

## 2016-05-09 MED ORDER — INSULIN ASPART 100 UNIT/ML ~~LOC~~ SOLN: 0.0000 [IU] | Freq: Three times a day (TID) | SUBCUTANEOUS | Status: DC

## 2016-05-09 MED ORDER — SODIUM CHLORIDE 0.9 % IV BOLUS (SEPSIS)
500.0000 mL | Freq: Once | INTRAVENOUS | Status: AC
  Administered 2016-05-09: 500 mL via INTRAVENOUS

## 2016-05-09 MED ORDER — VITAMIN B-1 100 MG PO TABS
100.0000 mg | ORAL_TABLET | Freq: Every day | ORAL | Status: DC
  Administered 2016-05-09 – 2016-05-12 (×4): 100 mg via ORAL
  Filled 2016-05-09 (×4): qty 1

## 2016-05-09 MED ORDER — INSULIN ASPART PROT & ASPART (70-30 MIX) 100 UNIT/ML ~~LOC~~ SUSP
25.0000 [IU] | Freq: Two times a day (BID) | SUBCUTANEOUS | Status: DC
  Administered 2016-05-09 – 2016-05-10 (×2): 25 [IU] via SUBCUTANEOUS
  Filled 2016-05-09: qty 10

## 2016-05-09 NOTE — Progress Notes (Signed)
Report received from M.Long,RN. No change in assessment, continue plan of care. Stacey Drain
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
## 2016-05-09 NOTE — Progress Notes (Signed)
Inpatient Diabetes Program Recommendations  AACE/ADA: New Consensus Statement on Inpatient Glycemic Control (2015)  Target Ranges:  Prepandial:   less than 140 mg/dL      Peak postprandial:   less than 180 mg/dL (1-2 hours)      Critically ill patients:  140 - 180 mg/dL    Results for TREMON, SAINVIL (MRN [id number]) as of 05/09/2016 08:52  Ref. Range 05/08/2016 22:19  Sodium Latest Ref Range: 135 - 145 mmol/L 122 (L)  Potassium Latest Ref Range: 3.5 - 5.1 mmol/L 4.9  Chloride Latest Ref Range: 101 - 111 mmol/L 85 (L)  CO2 Latest Ref Range: 22 - 32 mmol/L 11 (L)  BUN Latest Ref Range: 6 - 20 mg/dL 37 (H)  Creatinine Latest Ref Range: 0.61 - 1.24 mg/dL 2.80 (H)  Calcium Latest Ref Range: 8.9 - 10.3 mg/dL 8.7 (L)  EGFR (Non-African Amer.) Latest Ref Range: >60 mL/min 21 (L)  EGFR (African American) Latest Ref Range: >60 mL/min 25 (L)  Glucose Latest Ref Range: 65 - 99 mg/dL 888 (HH)  Anion gap Latest Ref Range: 5 - 15  26 (H)    Admit with: DKA  History: DM since age 32 (67 year history)  Home DM Meds: 70/30 Insulin: 27 units bidwc       Novolog 3 units tidwc  Current Insulin Orders: 70/30 Insulin: 25 units bidwc     -Transitioning off IV Insulin drip this AM.  Given 25 units 70/30 Insulin at 8am today.  -Per Record Review, note that patient saw his PCP Mauricio Po, [name] with [facility]) on 03/14/16.  70/30 Insulin was increased to 27 units bidwc at that visit b/c his A1c was elevated to 9.2%.  -Note another A1c pending.  Not sure why another A1c has been drawn as one was just completed in June.  -Spoke with patient this AM.  Patient very pleasant and answered all my questions.  Confirmed with patient that he is supposed to be taking 70/30 insulin 27 units bidwc + Novolog 3 units tidwc at home.  Patient stated he checks his CBGs tid at home as well.  Lives by himself.  Not sure how well patient is able to take care of himself.  Note that his PCP office stated that they were unsure  how well patient is able to comply with his medication regimen and nutrition plan at home.  Patient told me his family drives him to his MD appointments and takes him shopping as he does not drive.  -Asked RN caring for patient to please make sure Care management following pt for possible home health needs, assisted living issues.   --Will follow patient during hospitalization--  Wyn Quaker RN, MSN, CDE Diabetes Coordinator Inpatient Glycemic Control Team Team Pager: [phone] (8a-5p)

## 2016-05-09 NOTE — Progress Notes (Signed)
Pt CBG 63 at 1709 prior to eating meal.  At 1820, after meal consumed, pt CBG 129.  MD was notified and order received to give 70/30 insulin (10 units) SQ.  Maryjo Rochester, Laurel Dimmer

## 2016-05-10 LAB — BASIC METABOLIC PANEL
Anion gap: 6 (ref 5–15)
BUN: 16 mg/dL (ref 6–20)
CO2: 21 mmol/L — ABNORMAL LOW (ref 22–32)
Calcium: 8.1 mg/dL — ABNORMAL LOW (ref 8.9–10.3)
Chloride: 112 mmol/L — ABNORMAL HIGH (ref 101–111)
Creatinine, Ser: 1.28 mg/dL — ABNORMAL HIGH (ref 0.61–1.24)
GFR calc Af Amer: >60 mL/min (ref >60)
GFR calc non Af Amer: 55 mL/min — ABNORMAL LOW (ref >60)
Glucose, Bld: 67 mg/dL (ref 65–99)
Potassium: 3.7 mmol/L (ref 3.5–5.1)
Sodium: 139 mmol/L (ref 135–145)

## 2016-05-10 LAB — GLUCOSE, CAPILLARY
Glucose-Capillary: 136 mg/dL — ABNORMAL HIGH (ref 65–99)
Glucose-Capillary: 177 mg/dL — ABNORMAL HIGH (ref 65–99)
Glucose-Capillary: 177 mg/dL — ABNORMAL HIGH (ref 65–99)
Glucose-Capillary: 304 mg/dL — ABNORMAL HIGH (ref 65–99)
Glucose-Capillary: 45 mg/dL — ABNORMAL LOW (ref 65–99)
Glucose-Capillary: 66 mg/dL (ref 65–99)
Glucose-Capillary: 77 mg/dL (ref 65–99)
Glucose-Capillary: 83 mg/dL (ref 65–99)
Glucose-Capillary: 92 mg/dL (ref 65–99)

## 2016-05-10 LAB — MAGNESIUM: Magnesium: 2.1 mg/dL (ref 1.7–2.4)

## 2016-05-10 LAB — HEMOGLOBIN A1C
Hgb A1c MFr Bld: 9.5 % — ABNORMAL HIGH (ref 4.8–5.6)
Mean Plasma Glucose: 226 mg/dL

## 2016-05-10 MED ORDER — INSULIN ASPART PROT & ASPART (70-30 MIX) 100 UNIT/ML ~~LOC~~ SUSP: 15.0000 [IU] | Freq: Two times a day (BID) | SUBCUTANEOUS | Status: DC

## 2016-05-10 MED ORDER — ACETAMINOPHEN 325 MG PO TABS
650.0000 mg | ORAL_TABLET | Freq: Four times a day (QID) | ORAL | Status: DC | PRN
  Administered 2016-05-10 – 2016-05-11 (×2): 650 mg via ORAL
  Filled 2016-05-10 (×2): qty 2

## 2016-05-10 MED ORDER — INSULIN ASPART PROT & ASPART (70-30 MIX) 100 UNIT/ML ~~LOC~~ SUSP
20.0000 [IU] | Freq: Two times a day (BID) | SUBCUTANEOUS | Status: DC
  Filled 2016-05-10: qty 10

## 2016-05-10 MED ORDER — ENOXAPARIN SODIUM 40 MG/0.4ML ~~LOC~~ SOLN
40.0000 mg | SUBCUTANEOUS | Status: DC
  Administered 2016-05-11 – 2016-05-12 (×2): 40 mg via SUBCUTANEOUS
  Filled 2016-05-10 (×2): qty 0.4

## 2016-05-10 NOTE — Evaluation (Signed)
Physical Therapy Evaluation Patient Details Name: [name] [name] MRN: [id number] DOB: 08/16/1946 Today's Date: 05/10/2016   History of Present Illness  70 y/o male admitted with hyperglycemia with BS > 600.  PMH includes admission ~ 8 months ago for BS > 1000, DM, CKD, HTN, depression  Clinical Impression  Pt admitted with above diagnosis. Pt currently with functional limitations due to the deficits listed below (see PT Problem List). Pt will benefit from skilled PT to increase their independence and safety with mobility to allow discharge to the venue listed below.  Pt would benefit from HHPT for a home safety assessment due to recent fall at home.  Although pt is alert and oriented to person, place, and time, he could not tell why he was in the hospital, and denied any falls in last 6 months, but then later stated scrape on his nose was from a fall.     Follow Up Recommendations Home health PT;Supervision - Intermittent    Equipment Recommendations  None recommended by PT    Recommendations for Other Services       Precautions / Restrictions Precautions Precautions: Fall Restrictions Weight Bearing Restrictions: No      Mobility  Bed Mobility Overal bed mobility: Modified Independent             General bed mobility comments: bed flat without rails  Transfers Overall transfer level: Modified independent Equipment used: None             General transfer comment: no A for standing  Ambulation/Gait Ambulation/Gait assistance: Min guard Ambulation Distance (Feet): 350 Feet Assistive device: None;Rolling walker (2 wheeled) Gait Pattern/deviations: Step-through pattern;Decreased dorsiflexion - right;Decreased dorsiflexion - left     General Gait Details: Step through pattern, but lacks arm swing and lacks heel to toe pattern. Able to turn head and nod, but slows gait down to complete, but no LOB.  Trial with RW for a portion of gait and pt stated he did feel a bit  steadier, but would not use at home.  Stairs            Wheelchair Mobility    Modified Rankin (Stroke Patients Only)       Balance Overall balance assessment: History of Falls;Needs assistance           Standing balance-Leahy Scale: Fair                               Pertinent Vitals/Pain Pain Assessment: No/denies pain    Home Living Family/patient expects to be discharged to:: Private residence Living Arrangements: Alone Available Help at Discharge: Family;Available PRN/intermittently (reports no A avaiable at home) Type of Home: House Home Access: Stairs to enter   [employer] of Steps: 1 Home Layout: One level Home Equipment: None Additional Comments: does not drive. Sister takes to the grocery store    Prior Function Level of Independence: Independent         Comments: When asked about falls he denied, but when asked about scrape on nose, he said it was from a fall     Hand Dominance        Extremity/Trunk Assessment   Upper Extremity Assessment: Overall WFL for tasks assessed           Lower Extremity Assessment: Overall WFL for tasks assessed      Cervical / Trunk Assessment: Normal  Communication   Communication: No difficulties  Cognition Arousal/Alertness: Awake/alert  Behavior During Therapy: WFL for tasks assessed/performed Overall Cognitive Status: Within Functional Limits for tasks assessed (Oriented to person, place, time, but not sure why he was admitted to hospital)       Memory: Decreased short-term memory              General Comments      Exercises        Assessment/Plan    PT Assessment Patient needs continued PT services  PT Diagnosis Difficulty walking   PT Problem List Decreased mobility;Decreased balance  PT Treatment Interventions Gait training;Functional mobility training;Therapeutic activities;Therapeutic exercise   PT Goals (Current goals can be found in the Care Plan  section) Acute Rehab PT Goals Patient Stated Goal: To go home today PT Goal Formulation: With patient Time For Goal Achievement: 05/17/16 Potential to Achieve Goals: Good    Frequency Min 3X/week   Barriers to discharge Decreased caregiver support      Co-evaluation               End of Session Equipment Utilized During Treatment: Gait belt Activity Tolerance: Patient tolerated treatment well Patient left: in chair;with call bell/phone within reach;with chair alarm set Nurse Communication: Mobility status         Time: 1200-1220 PT Time Calculation (min) (ACUTE ONLY): 20 min   Charges:   PT Evaluation $PT Eval Moderate Complexity: 1 Procedure     PT G Codes:        [name] [name] 05/10/2016, 12:36 PM

## 2016-05-10 NOTE — Progress Notes (Signed)
CBG this am 66, orange juice given to patient. CBG recheck at 0640, CBG 83. Pt assisted with ordering breakfast. Will continue to monitor.
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
## 2016-05-11 LAB — GLUCOSE, CAPILLARY
Glucose-Capillary: 474 mg/dL — ABNORMAL HIGH (ref 65–99)
Glucose-Capillary: 581 mg/dL (ref 65–99)
Glucose-Capillary: 478 mg/dL — ABNORMAL HIGH (ref 65–99)
Glucose-Capillary: 124 mg/dL — ABNORMAL HIGH (ref 65–99)
Glucose-Capillary: 35 mg/dL — CL (ref 65–99)
Glucose-Capillary: 84 mg/dL (ref 65–99)
Glucose-Capillary: 271 mg/dL — ABNORMAL HIGH (ref 65–99)

## 2016-05-11 MED ORDER — INSULIN ASPART 100 UNIT/ML ~~LOC~~ SOLN
4.0000 [IU] | Freq: Three times a day (TID) | SUBCUTANEOUS | Status: DC
  Administered 2016-05-11 (×2): 4 [IU] via SUBCUTANEOUS

## 2016-05-11 MED ORDER — INSULIN ASPART PROT & ASPART (70-30 MIX) 100 UNIT/ML ~~LOC~~ SUSP
25.0000 [IU] | Freq: Two times a day (BID) | SUBCUTANEOUS | Status: DC
  Administered 2016-05-11: 25 [IU] via SUBCUTANEOUS

## 2016-05-11 MED ORDER — AMLODIPINE BESYLATE 10 MG PO TABS
10.0000 mg | ORAL_TABLET | Freq: Every day | ORAL | Status: DC
  Administered 2016-05-11 – 2016-05-12 (×2): 10 mg via ORAL
  Filled 2016-05-11 (×2): qty 1

## 2016-05-11 MED ORDER — HYDRALAZINE HCL 20 MG/ML IJ SOLN
10.0000 mg | Freq: Four times a day (QID) | INTRAMUSCULAR | Status: DC | PRN
  Administered 2016-05-11 – 2016-05-12 (×2): 10 mg via INTRAVENOUS
  Filled 2016-05-11 (×2): qty 1

## 2016-05-11 MED ORDER — INSULIN ASPART 100 UNIT/ML ~~LOC~~ SOLN: 0.0000 [IU] | Freq: Every day | SUBCUTANEOUS | Status: DC

## 2016-05-11 MED ORDER — INSULIN ASPART 100 UNIT/ML ~~LOC~~ SOLN
0.0000 [IU] | Freq: Three times a day (TID) | SUBCUTANEOUS | Status: DC
  Administered 2016-05-11: 1 [IU] via SUBCUTANEOUS

## 2016-05-11 MED ORDER — INSULIN ASPART 100 UNIT/ML ~~LOC~~ SOLN
15.0000 [IU] | Freq: Once | SUBCUTANEOUS | Status: AC
  Administered 2016-05-11: 15 [IU] via SUBCUTANEOUS
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
## 2016-05-11 NOTE — Progress Notes (Signed)
Hypoglycemic Event  CBG: 35  Treatment: 15 GM carbohydrate snack  Symptoms: None  Follow-up CBG: Time:1733 CBG Result:84  Possible Reasons for Event: unknown  Comments/MD notified:Dr Wille Glaser, Jodie Echevaria

## 2016-05-12 LAB — GLUCOSE, CAPILLARY
Glucose-Capillary: 346 mg/dL — ABNORMAL HIGH (ref 65–99)
Glucose-Capillary: 453 mg/dL — ABNORMAL HIGH (ref 65–99)
Glucose-Capillary: 159 mg/dL — ABNORMAL HIGH (ref 65–99)
Glucose-Capillary: 391 mg/dL — ABNORMAL HIGH (ref 65–99)
Glucose-Capillary: 35 mg/dL — CL (ref 65–99)

## 2016-05-12 MED ORDER — INSULIN ASPART 100 UNIT/ML ~~LOC~~ SOLN
15.0000 [IU] | Freq: Once | SUBCUTANEOUS | Status: AC
  Administered 2016-05-12: 15 [IU] via SUBCUTANEOUS

## 2016-05-12 MED ORDER — CARVEDILOL 6.25 MG PO TABS: 6.2500 mg | ORAL_TABLET | Freq: Two times a day (BID) | ORAL | 0 refills | Status: DC

## 2016-05-12 MED ORDER — INSULIN ASPART PROT & ASPART (70-30 MIX) 100 UNIT/ML ~~LOC~~ SUSP
25.0000 [IU] | Freq: Two times a day (BID) | SUBCUTANEOUS | Status: DC
  Administered 2016-05-12: 25 [IU] via SUBCUTANEOUS

## 2016-05-12 MED ORDER — CARVEDILOL 6.25 MG PO TABS: 6.2500 mg | ORAL_TABLET | Freq: Two times a day (BID) | ORAL | Status: DC

## 2016-05-12 MED ORDER — INSULIN ASPART 100 UNIT/ML ~~LOC~~ SOLN
5.0000 [IU] | Freq: Once | SUBCUTANEOUS | Status: AC
  Administered 2016-05-12: 5 [IU] via SUBCUTANEOUS

## 2016-05-12 NOTE — Discharge Instructions (Signed)
Follow with Primary MD Mauricio Po, [name] in 3 days   Get CBC, CMP, 2 view Chest X ray checked  by Primary MD or SNF MD in 5-7 days ( we routinely change or add medications that can affect your baseline labs and fluid status, therefore we recommend that you get the mentioned basic workup next visit with your PCP, your PCP may decide not to get them or add new tests based on their clinical decision)   Activity: As tolerated with Full fall precautions use walker/cane & assistance as needed   Disposition Home     Diet:   Heart Healthy Low carb.  Accuchecks 4 times/day, Once in AM empty stomach and then before each meal. Log in all results and show them to your Prim.MD in 3 days. If any glucose reading is under 80 or above 300 call your Prim MD immidiately. Follow Low glucose instructions for glucose under 80 as instructed.   For Heart failure patients - Check your Weight same time everyday, if you gain over 2 pounds, or you develop in leg swelling, experience more shortness of breath or chest pain, call your Primary MD immediately. Follow Cardiac Low Salt Diet and 1.5 lit/day fluid restriction.   On your next visit with your primary care physician please Get Medicines reviewed and adjusted.   Please request your Prim.MD to go over all Hospital Tests and Procedure/Radiological results at the follow up, please get all Hospital records sent to your Prim MD by signing hospital release before you go home.   If you experience worsening of your admission symptoms, develop shortness of breath, life threatening emergency, suicidal or homicidal thoughts you must seek medical attention immediately by calling 911 or calling your MD immediately  if symptoms less severe.  You Must read complete instructions/literature along with all the possible adverse reactions/side effects for all the Medicines you take and that have been prescribed to you. Take any new Medicines after you have completely  understood and accpet all the possible adverse reactions/side effects.   Do not drive, operate heavy machinery, perform activities at heights, swimming or participation in water activities or provide baby sitting services if your were admitted for syncope or siezures until you have seen by Primary MD or a Neurologist and advised to do so again.  Do not drive when taking Pain medications.    Do not take more than prescribed Pain, Sleep and Anxiety Medications  Special Instructions: If you have smoked or chewed Tobacco  in the last 2 yrs please stop smoking, stop any regular Alcohol  and or any Recreational drug use.  Wear Seat belts while driving.   Please note  You were cared for by a hospitalist during your hospital stay. If you have any questions about your discharge medications or the care you received while you were in the hospital after you are discharged, you can call the unit and asked to speak with the hospitalist on call if the hospitalist that took care of you is not available. Once you are discharged, your primary care physician will handle any further medical issues. Please note that NO REFILLS for any discharge medications will be authorized once you are discharged, as it is imperative that you return to your primary care physician (or establish a relationship with a primary care physician if you do not have one) for your aftercare needs so that they can reassess your need for medications and monitor your lab values.

## 2016-05-12 NOTE — Care Management Note (Signed)
Case Management Note  Patient Details  Name: [name] [name] MRN: [id number] Date of Birth: 1946/01/04  Subjective/Objective:  70 y/o m admitted w/DKA. From home, has glucometer,has rw. PT recc HHPT-patient chose [facility]-rep Santiago Glad aware of d/c & HHPT order.                  Action/Plan:d/c home w/[facility].   Expected Discharge Date:   (unknown)               Expected Discharge Plan:  [place]  In-House Referral:     Discharge planning Services  CM Consult  Post Acute Care Choice:    Choice offered to:  Patient  DME Arranged:    DME Agency:     HH Arranged:  PT [place]:  [facility]  Status of Service:     If discussed at [place] of Stay Meetings, dates discussed:    Additional Comments:  Dessa Phi, RN 05/12/2016, 12:39 PM
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
## 2016-05-14 ENCOUNTER — Telehealth: Payer: Self-pay | Admitting: *Deleted

## 2016-05-14 NOTE — Telephone Encounter (Signed)
Called pt to set-up hosp f/u appt sister stated that he was not home. She will have him to give me a call back once he return...Johny Chess

## 2016-05-15 ENCOUNTER — Emergency Department (HOSPITAL_COMMUNITY): Payer: Medicare HMO

## 2016-05-15 ENCOUNTER — Encounter (HOSPITAL_COMMUNITY): Payer: Self-pay | Admitting: Emergency Medicine

## 2016-05-15 ENCOUNTER — Inpatient Hospital Stay (HOSPITAL_COMMUNITY)
Admission: EM | Admit: 2016-05-15 | Discharge: 2016-05-17 | DRG: 638 | Disposition: A | Discharge status: Home Health | Payer: Medicare HMO | Attending: Internal Medicine | Admitting: Internal Medicine

## 2016-05-15 DIAGNOSIS — Z833 Family history of diabetes mellitus: Secondary | ICD-10-CM

## 2016-05-15 DIAGNOSIS — D638 Anemia in other chronic diseases classified elsewhere: Secondary | ICD-10-CM | POA: Diagnosis present

## 2016-05-15 DIAGNOSIS — I129 Hypertensive chronic kidney disease with stage 1 through stage 4 chronic kidney disease, or unspecified chronic kidney disease: Secondary | ICD-10-CM | POA: Diagnosis present

## 2016-05-15 DIAGNOSIS — E878 Other disorders of electrolyte and fluid balance, not elsewhere classified: Secondary | ICD-10-CM | POA: Diagnosis present

## 2016-05-15 DIAGNOSIS — Z79899 Other long term (current) drug therapy: Secondary | ICD-10-CM

## 2016-05-15 DIAGNOSIS — E1140 Type 2 diabetes mellitus with diabetic neuropathy, unspecified: Secondary | ICD-10-CM | POA: Diagnosis present

## 2016-05-15 DIAGNOSIS — Z8249 Family history of ischemic heart disease and other diseases of the circulatory system: Secondary | ICD-10-CM

## 2016-05-15 DIAGNOSIS — E871 Hypo-osmolality and hyponatremia: Secondary | ICD-10-CM | POA: Diagnosis present

## 2016-05-15 DIAGNOSIS — F419 Anxiety disorder, unspecified: Secondary | ICD-10-CM | POA: Diagnosis present

## 2016-05-15 DIAGNOSIS — Z794 Long term (current) use of insulin: Secondary | ICD-10-CM

## 2016-05-15 DIAGNOSIS — Z9119 Patient's noncompliance with other medical treatment and regimen: Secondary | ICD-10-CM | POA: Diagnosis not present

## 2016-05-15 DIAGNOSIS — Z7902 Long term (current) use of antithrombotics/antiplatelets: Secondary | ICD-10-CM | POA: Diagnosis not present

## 2016-05-15 DIAGNOSIS — E1100 Type 2 diabetes mellitus with hyperosmolarity without nonketotic hyperglycemic-hyperosmolar coma (NKHHC): Secondary | ICD-10-CM

## 2016-05-15 DIAGNOSIS — Z8673 Personal history of transient ischemic attack (TIA), and cerebral infarction without residual deficits: Secondary | ICD-10-CM | POA: Diagnosis not present

## 2016-05-15 DIAGNOSIS — N189 Chronic kidney disease, unspecified: Secondary | ICD-10-CM

## 2016-05-15 DIAGNOSIS — R0902 Hypoxemia: Secondary | ICD-10-CM | POA: Diagnosis present

## 2016-05-15 DIAGNOSIS — E785 Hyperlipidemia, unspecified: Secondary | ICD-10-CM | POA: Diagnosis present

## 2016-05-15 DIAGNOSIS — Z820 Family history of epilepsy and other diseases of the nervous system: Secondary | ICD-10-CM

## 2016-05-15 DIAGNOSIS — E869 Volume depletion, unspecified: Secondary | ICD-10-CM | POA: Diagnosis present

## 2016-05-15 DIAGNOSIS — Z7982 Long term (current) use of aspirin: Secondary | ICD-10-CM

## 2016-05-15 DIAGNOSIS — E1165 Type 2 diabetes mellitus with hyperglycemia: Secondary | ICD-10-CM

## 2016-05-15 DIAGNOSIS — F32A Depression, unspecified: Secondary | ICD-10-CM | POA: Diagnosis present

## 2016-05-15 DIAGNOSIS — E1122 Type 2 diabetes mellitus with diabetic chronic kidney disease: Secondary | ICD-10-CM | POA: Diagnosis present

## 2016-05-15 DIAGNOSIS — E1101 Type 2 diabetes mellitus with hyperosmolarity with coma: Secondary | ICD-10-CM

## 2016-05-15 DIAGNOSIS — Z96641 Presence of right artificial hip joint: Secondary | ICD-10-CM | POA: Diagnosis present

## 2016-05-15 DIAGNOSIS — N183 Chronic kidney disease, stage 3 unspecified: Secondary | ICD-10-CM | POA: Diagnosis present

## 2016-05-15 DIAGNOSIS — I10 Essential (primary) hypertension: Secondary | ICD-10-CM

## 2016-05-15 DIAGNOSIS — E1121 Type 2 diabetes mellitus with diabetic nephropathy: Secondary | ICD-10-CM | POA: Diagnosis present

## 2016-05-15 DIAGNOSIS — N179 Acute kidney failure, unspecified: Secondary | ICD-10-CM | POA: Diagnosis not present

## 2016-05-15 DIAGNOSIS — D631 Anemia in chronic kidney disease: Secondary | ICD-10-CM | POA: Diagnosis present

## 2016-05-15 DIAGNOSIS — Z87891 Personal history of nicotine dependence: Secondary | ICD-10-CM | POA: Diagnosis not present

## 2016-05-15 DIAGNOSIS — E1169 Type 2 diabetes mellitus with other specified complication: Secondary | ICD-10-CM | POA: Diagnosis present

## 2016-05-15 DIAGNOSIS — D72829 Elevated white blood cell count, unspecified: Secondary | ICD-10-CM | POA: Diagnosis present

## 2016-05-15 DIAGNOSIS — F329 Major depressive disorder, single episode, unspecified: Secondary | ICD-10-CM | POA: Diagnosis present

## 2016-05-15 DIAGNOSIS — IMO0002 Reserved for concepts with insufficient information to code with codable children: Secondary | ICD-10-CM

## 2016-05-15 DIAGNOSIS — R739 Hyperglycemia, unspecified: Secondary | ICD-10-CM

## 2016-05-15 HISTORY — DX: Type 2 diabetes mellitus with hyperosmolarity without nonketotic hyperglycemic-hyperosmolar coma (NKHHC): E11.00

## 2016-05-15 LAB — URINALYSIS, ROUTINE W REFLEX MICROSCOPIC
Bilirubin Urine: NEGATIVE
Glucose, UA: >1000 mg/dL — AB
Ketones, ur: NEGATIVE mg/dL
Leukocytes, UA: NEGATIVE
Nitrite: NEGATIVE
Protein, ur: 100 mg/dL — AB
Specific Gravity, Urine: 1.024 (ref 1.005–1.030)
pH: 5.5 (ref 5.0–8.0)

## 2016-05-15 LAB — COMPREHENSIVE METABOLIC PANEL
ALT: 16 U/L — ABNORMAL LOW (ref 17–63)
AST: 27 U/L (ref 15–41)
Albumin: 4.1 g/dL (ref 3.5–5.0)
Alkaline Phosphatase: 121 U/L (ref 38–126)
Anion gap: 11 (ref 5–15)
BUN: 30 mg/dL — ABNORMAL HIGH (ref 6–20)
CO2: 23 mmol/L (ref 22–32)
Calcium: 8.4 mg/dL — ABNORMAL LOW (ref 8.9–10.3)
Chloride: 97 mmol/L — ABNORMAL LOW (ref 101–111)
Creatinine, Ser: 2.71 mg/dL — ABNORMAL HIGH (ref 0.61–1.24)
GFR calc Af Amer: 26 mL/min — ABNORMAL LOW (ref >60)
GFR calc non Af Amer: 22 mL/min — ABNORMAL LOW (ref >60)
Glucose, Bld: 729 mg/dL (ref 65–99)
Potassium: 5.1 mmol/L (ref 3.5–5.1)
Sodium: 131 mmol/L — ABNORMAL LOW (ref 135–145)
Total Bilirubin: 0.6 mg/dL (ref 0.3–1.2)
Total Protein: 7.4 g/dL (ref 6.5–8.1)

## 2016-05-15 LAB — CBG MONITORING, ED
Glucose-Capillary: >600 mg/dL (ref 65–99)
Glucose-Capillary: 444 mg/dL — ABNORMAL HIGH (ref 65–99)
Glucose-Capillary: 320 mg/dL — ABNORMAL HIGH (ref 65–99)

## 2016-05-15 LAB — CBC WITH DIFFERENTIAL/PLATELET
Basophils Absolute: 0 10*3/uL (ref 0.0–0.1)
Basophils Relative: 0 %
Eosinophils Absolute: 0 10*3/uL (ref 0.0–0.7)
Eosinophils Relative: 0 %
HCT: 38.1 % — ABNORMAL LOW (ref 39.0–52.0)
Hemoglobin: 12.6 g/dL — ABNORMAL LOW (ref 13.0–17.0)
Lymphocytes Relative: 4 %
Lymphs Abs: 0.6 10*3/uL — ABNORMAL LOW (ref 0.7–4.0)
MCH: 30.7 pg (ref 26.0–34.0)
MCHC: 33.1 g/dL (ref 30.0–36.0)
MCV: 92.9 fL (ref 78.0–100.0)
Monocytes Absolute: 1.1 10*3/uL — ABNORMAL HIGH (ref 0.1–1.0)
Monocytes Relative: 8 %
Neutro Abs: 12.1 10*3/uL — ABNORMAL HIGH (ref 1.7–7.7)
Neutrophils Relative %: 88 %
Platelets: 254 10*3/uL (ref 150–400)
RBC: 4.1 MIL/uL — ABNORMAL LOW (ref 4.22–5.81)
RDW: 13.6 % (ref 11.5–15.5)
WBC: 13.7 10*3/uL — ABNORMAL HIGH (ref 4.0–10.5)

## 2016-05-15 LAB — I-STAT CHEM 8, ED
BUN: 28 mg/dL — ABNORMAL HIGH (ref 6–20)
Calcium, Ion: 1.09 mmol/L — ABNORMAL LOW (ref 1.12–1.23)
Chloride: 96 mmol/L — ABNORMAL LOW (ref 101–111)
Creatinine, Ser: 2.4 mg/dL — ABNORMAL HIGH (ref 0.61–1.24)
Glucose, Bld: >700 mg/dL (ref 65–99)
HCT: 41 % (ref 39.0–52.0)
Hemoglobin: 13.9 g/dL (ref 13.0–17.0)
Potassium: 5.1 mmol/L (ref 3.5–5.1)
Sodium: 132 mmol/L — ABNORMAL LOW (ref 135–145)
TCO2: 26 mmol/L (ref 0–100)

## 2016-05-15 LAB — ETHANOL: Alcohol, Ethyl (B): <5 mg/dL (ref <5)

## 2016-05-15 LAB — CK: Total CK: 1102 U/L — ABNORMAL HIGH (ref 49–397)

## 2016-05-15 LAB — URINE MICROSCOPIC-ADD ON

## 2016-05-15 LAB — I-STAT TROPONIN, ED: Troponin i, poc: 0.02 ng/mL (ref 0.00–0.08)

## 2016-05-15 LAB — RAPID URINE DRUG SCREEN, HOSP PERFORMED
Amphetamines: NOT DETECTED
Barbiturates: NOT DETECTED
Benzodiazepines: NOT DETECTED
Cocaine: NOT DETECTED
Opiates: POSITIVE — AB
Tetrahydrocannabinol: NOT DETECTED

## 2016-05-15 LAB — I-STAT CG4 LACTIC ACID, ED: Lactic Acid, Venous: 1.82 mmol/L (ref 0.5–1.9)

## 2016-05-15 LAB — MRSA PCR SCREENING: MRSA by PCR: NEGATIVE

## 2016-05-15 LAB — SALICYLATE LEVEL: Salicylate Lvl: <4 mg/dL (ref 2.8–30.0)

## 2016-05-15 LAB — ACETAMINOPHEN LEVEL: Acetaminophen (Tylenol), Serum: <10 ug/mL — ABNORMAL LOW (ref 10–30)

## 2016-05-15 LAB — MAGNESIUM: Magnesium: 2.3 mg/dL (ref 1.7–2.4)

## 2016-05-15 MED ORDER — INSULIN REGULAR HUMAN 100 UNIT/ML IJ SOLN
INTRAMUSCULAR | Status: DC
Start: 2016-05-15 — End: 2016-05-16
  Administered 2016-05-15: 2.8 [IU]/h via INTRAVENOUS
  Filled 2016-05-15: qty 2.5

## 2016-05-15 MED ORDER — SODIUM CHLORIDE 0.9 % IV SOLN
Freq: Once | INTRAVENOUS | Status: AC
  Administered 2016-05-15: 100 mL/h via INTRAVENOUS

## 2016-05-15 MED ORDER — DEXTROSE-NACL 5-0.45 % IV SOLN
INTRAVENOUS | Status: DC
  Administered 2016-05-15: 22:00:00 via INTRAVENOUS

## 2016-05-15 MED ORDER — SODIUM CHLORIDE 0.9 % IV SOLN
INTRAVENOUS | Status: DC
  Administered 2016-05-15: 22:00:00 via INTRAVENOUS

## 2016-05-15 MED ORDER — ASPIRIN EC 325 MG PO TBEC
325.0000 mg | DELAYED_RELEASE_TABLET | Freq: Every day | ORAL | Status: DC
  Administered 2016-05-16 – 2016-05-17 (×2): 325 mg via ORAL
  Filled 2016-05-15 (×2): qty 1

## 2016-05-15 MED ORDER — ATORVASTATIN CALCIUM 10 MG PO TABS
10.0000 mg | ORAL_TABLET | Freq: Every day | ORAL | Status: DC
  Administered 2016-05-16: 10 mg via ORAL
  Filled 2016-05-15: qty 1

## 2016-05-15 MED ORDER — HEPARIN SODIUM (PORCINE) 5000 UNIT/ML IJ SOLN
5000.0000 [IU] | Freq: Three times a day (TID) | INTRAMUSCULAR | Status: DC
  Administered 2016-05-15 – 2016-05-17 (×5): 5000 [IU] via SUBCUTANEOUS
  Filled 2016-05-15 (×7): qty 1

## 2016-05-15 MED ORDER — ALPRAZOLAM 1 MG PO TABS: 1.0000 mg | ORAL_TABLET | Freq: Two times a day (BID) | ORAL | Status: DC | PRN

## 2016-05-15 MED ORDER — SODIUM CHLORIDE 0.9 % IV BOLUS (SEPSIS)
1000.0000 mL | Freq: Once | INTRAVENOUS | Status: AC
  Administered 2016-05-15: 1000 mL via INTRAVENOUS

## 2016-05-15 MED ORDER — DEXTROSE-NACL 5-0.45 % IV SOLN: INTRAVENOUS | Status: DC

## 2016-05-15 MED ORDER — CLOPIDOGREL BISULFATE 75 MG PO TABS
75.0000 mg | ORAL_TABLET | Freq: Every day | ORAL | Status: DC
  Administered 2016-05-16 – 2016-05-17 (×2): 75 mg via ORAL
  Filled 2016-05-15 (×2): qty 1

## 2016-05-15 MED ORDER — INSULIN REGULAR HUMAN 100 UNIT/ML IJ SOLN
INTRAMUSCULAR | Status: DC
  Administered 2016-05-15: 5.4 [IU]/h via INTRAVENOUS
  Filled 2016-05-15: qty 2.5

## 2016-05-15 NOTE — Progress Notes (Signed)
EDCM went to speak to patient at bedside, however, patient too drowsy to speak.  EDCM spoke to patient's sister [name]. Patient lives at home alone. [name] reports [facility] was supposed to come out yesterday and today, but they were unable to get into the house.  She reports patient was on the floor. Patient's pcp NP [place]. [name] reports she lives about 10 minutes away from patient and family check in on patient 2-3 times a day by phone and in person. She reports the patient sometimes forgets to take his insulin. [name] reports, "He eats whatever he wants." She reports when they are visiting him, he will draw up and inject himself with his insulin without difficulty. [name] confirms patient has a glucometer at home, but doesn't know if the patient has a rolling walker. [name] reports if family is aware patient has an appointment, they will arrange their schedules to accommodate patient to take him to his appointments. Patient recently admitted with DKA from 07/27 to 07/31. Patient does not qualify for [facility]. No further EDCM needs at this time.

## 2016-05-15 NOTE — ED Notes (Signed)
Patient states to sister that he hasnt checked his sugar nor taken his medications today.  Per sister patient was with his daughter earlier today and must have gotten patient [place] because when sister got to house food was there but hadnt been touched. Home Health went out to house today not yesterday per sister.

## 2016-05-15 NOTE — H&P (Signed)
History and Physical    [name] [name] UQJ:[id number] DOB: 1946/08/15 DOA: 05/15/2016  PCP: Mauricio Po, [name]   Patient coming from: Home   Chief Complaint: Lethargy, hyperglycemia   HPI: [name] [name] is a 70 y.o. male with medical history significant for insulin-dependent diabetes mellitus, hypertension, hyperlipidemia, ischemic CVA in 2016, and chronic kidney disease stage III who presents to the emergency department after being found lethargic at home with CBG "high." Patient was just discharged from the hospital on 05/12/2016 after management for DKA that was attributed to the patient not being able to find his insulin for several days. Patient was discharged home in much improved and stable condition with resolution of the associated AKI. Home health was arranged for the patient upon discharge, but when they went to visit the patient today, there was no answer at the door. Patient was reportedly with his daughter earlier in the day with no report of anything amiss at that time, though it was noted that he had not checked his sugar or taken any medications today. EMS was reportedly sent to the house by concerned family member and found the patient to be lethargic and curled up on the floor, saturating in the 80s.    ED Course: Upon arrival to the ED, patient is found to be afebrile, saturating 87% on room air, and with vitals otherwise stable. EKG demonstrates a sinus rhythm with left anterior fascicular block, peaked T waves, and anterior Q wave. Head CT is negative for acute intracranial abnormality and chest x-ray is notable only for mild bilateral subsegmental atelectasis. CMP features a mild hyponatremia and hypochloremia, serum creatinine of 2.71, up from 1.28 less than a week ago. Serum glucose is elevated to 729. Serum bicarbonate and anion gap are normal. CBC features a normocytic anemia with hemoglobin of 12.6, up from an apparent baseline of closer to 10. There is also a leukocytosis of  13,700 noted on the CBC. Lactic acid is reassuring at 1.82. Troponin is normal at 0.02 and UDS is positive for opiates only. Urinalysis features greater than 1000 glucose, negative for ketones, and has 100 protein. CK was mildly elevated to 1102. Ethanol, acetaminophen, and salicylate levels are undetectable. Patient was given 2 L of normal saline as a bolus in the emergency department and started on infusion of IV insulin. O2 saturations normalized with the administration of 2 L/m supplemental oxygen. Patient remained hemodynamically stable in the ED and will be admitted to the stepdown unit for ongoing evaluation and management of HHS suspected secondary to nonadherence to his insulin regimen.  Review of Systems:  Unable to obtain ROS d/t the clinical scenario with lethargic patient.  Past Medical History:  Diagnosis Date  . Diabetes mellitus without complication ([place])   . Hypertension     Past Surgical History:  Procedure Laterality Date  . HIP ARTHROPLASTY Right 02/05/2013   Procedure: ARTHROPLASTY BIPOLAR HIP;  Surgeon: [name], MD;  Location: [facility];  Service: Orthopedics;  Laterality: Right;     reports that he quit smoking about 3 years ago. His smoking use included Cigarettes. He has a 7.50 pack-year smoking history. He has never used smokeless tobacco. He reports that he does not drink alcohol or use drugs.  No Known Allergies  Family History  Problem Relation Age of Onset  . Diabetes Mother   . Alzheimer's disease Mother   . Hypertension Mother   . Hyperlipidemia Father   . Hypertension Father   . Healthy Maternal Grandmother   .  Pneumonia Maternal Grandfather      Prior to Admission medications   Medication Sig Start Date End Date Taking? Authorizing Provider  carvedilol (COREG) 6.25 MG tablet Take 1 tablet (6.25 mg total) by mouth 2 (two) times daily with a meal. 05/12/16  Yes Thurnell Lose, MD  insulin aspart (NOVOLOG) 100 UNIT/ML FlexPen Inject 3 Units  into the skin 3 (three) times daily with meals. 03/17/16  Yes Golden Circle, [name]  insulin aspart protamine - aspart (NOVOLOG 70/30 MIX) (70-30) 100 UNIT/ML FlexPen Inject 0.27 mLs (27 Units total) into the skin 2 (two) times daily. 03/17/16  Yes Golden Circle, [name]  ramipril (ALTACE) 10 MG capsule Take 10 mg by mouth daily.  05/03/16  Yes Historical Provider, MD  acetaminophen (TYLENOL) 325 MG tablet Take 2 tablets (650 mg total) by mouth every 6 (six) hours as needed for mild pain (or Fever >/= 101). Patient not taking: Reported on 05/08/2016 08/14/15   Robbie Lis, MD  alprazolam [name]) 2 MG tablet Take 0.5 tablets (1 mg total) by mouth 2 (two) times daily as needed for anxiety. 08/24/15   Verlee Monte, MD  amLODipine (NORVASC) 10 MG tablet Take 1 tablet (10 mg total) by mouth daily. 08/14/15   Robbie Lis, MD  aspirin EC 325 MG tablet Take 1 tablet (325 mg total) by mouth daily. 06/08/15   Rosalin Hawking, MD  atorvastatin (LIPITOR) 10 MG tablet Take 1 tablet (10 mg total) by mouth daily at 6 PM. Patient taking differently: Take 10 mg by mouth daily.  03/23/15   [name], DO  blood glucose meter kit and supplies Use daily to check blood sugar. DX: E11.09 04/24/16   Golden Circle, [name]  clopidogrel (PLAVIX) 75 MG tablet Take 75 mg by mouth daily.    Historical Provider, MD  feeding supplement, GLUCERNA SHAKE, (GLUCERNA SHAKE) LIQD Take 237 mLs by mouth 2 (two) times daily between meals. Patient not taking: Reported on 05/08/2016 08/14/15   Robbie Lis, MD  gabapentin (NEURONTIN) 300 MG capsule Take 1 tablet by mouth daily for 1 day then 1 tablet by mouth twice daily for 1 day and then 1 tablet by mouth 3 times daily. Patient not taking: Reported on 05/08/2016 03/14/16   Golden Circle, [name]  glucose blood (COOL BLOOD GLUCOSE TEST STRIPS) test strip Use as instructed to check blood sugar up to 3 times a day. DX: E11.09 04/24/16   Golden Circle, [name]  Insulin Pen Needle 31G X 5 MM MISC 1 each by  Does not apply route 3 (three) times daily. 03/17/16   Golden Circle, [name]  Lancets MISC Use as instructed to check blood sugar up to 3 times a day. DX: E11.09 10/16/15   Golden Circle, [name]  levofloxacin (LEVAQUIN) 750 MG tablet Take 1 tablet (750 mg total) by mouth every evening. Patient not taking: Reported on 05/08/2016 11/03/15   Nat Math, MD  thiamine 100 MG tablet Take 1 tablet (100 mg total) by mouth daily. Patient not taking: Reported on 05/08/2016 11/03/15   Nat Math, MD    Physical Exam: Vitals:   05/15/16 1715 05/15/16 1800 05/15/16 1906 05/15/16 2000  BP: (!) 103/54 123/61 138/74 117/64  Pulse: 88 85 79 73  Resp: (!) '9 14 12 15  ' Temp:      TempSrc:      SpO2: 94% 98% 96% 99%      Constitutional: NAD, calm, comfortable Eyes: PERTLA, lids  and conjunctivae normal ENMT: Mucous membranes are dry. Posterior pharynx clear of any exudate or lesions.   Neck: normal, supple, no masses, no thyromegaly Respiratory: clear to auscultation bilaterally, no wheezing, no crackles. Normal respiratory effort.   Cardiovascular: S1 & S2 heard, regular rate and rhythm, soft systolic murmur at apex. No extremity edema. No significant JVD. Abdomen: No distension, no tenderness, no masses palpated. Bowel sounds normal.  Musculoskeletal: no clubbing / cyanosis. No joint deformity upper and lower extremities. Normal muscle tone.  Skin: no significant rashes, lesions, ulcers. Warm, dry, well-perfused. Neurologic: CN 2-12 grossly intact. Sensation intact, DTR normal. Strength 5/5 in all 4 limbs.  Psychiatric: Lethargic, oriented x 3.      Labs on Admission: I have personally reviewed following labs and imaging studies  CBC:  Recent Labs Lab 05/08/16 2219 05/15/16 1716 05/15/16 1731  WBC 14.8* 13.7*  --   NEUTROABS  --  12.1*  --   HGB 12.4* 12.6* 13.9  HCT 37.7* 38.1* 41.0  MCV 92.0 92.9  --   PLT 232 254  --    Basic Metabolic Panel:  Recent Labs Lab 05/08/16 2219  05/09/16 0330 05/10/16 0545 05/15/16 1716 05/15/16 1731  NA 122* 134* 139 131* 132*  K 4.9 3.8 3.7 5.1 5.1  CL 85* 103 112* 97* 96*  CO2 11* 23 21* 23  --   GLUCOSE 888* 308* 67 729* >700*  BUN 37* 31* 16 30* 28*  CREATININE 2.80* 2.11* 1.28* 2.71* 2.40*  CALCIUM 8.7* 8.1* 8.1* 8.4*  --   MG  --   --  2.1 2.3  --    GFR: Estimated Creatinine Clearance: 28.2 mL/min (by C-G formula based on SCr of 2.4 mg/dL). Liver Function Tests:  Recent Labs Lab 05/15/16 1716  AST 27  ALT 16*  ALKPHOS 121  BILITOT 0.6  PROT 7.4  ALBUMIN 4.1   No results for input(s): LIPASE, AMYLASE in the last 168 hours. No results for input(s): AMMONIA in the last 168 hours. Coagulation Profile: No results for input(s): INR, PROTIME in the last 168 hours. Cardiac Enzymes:  Recent Labs Lab 05/15/16 1716  CKTOTAL 1,102*   BNP (last 3 results) No results for input(s): PROBNP in the last 8760 hours. HbA1C: No results for input(s): HGBA1C in the last 72 hours. CBG:  Recent Labs Lab 05/12/16 0713 05/12/16 1036 05/15/16 1645 05/15/16 2005 05/15/16 2107  GLUCAP 391* 159* >600* 444* 320*   Lipid Profile: No results for input(s): CHOL, HDL, LDLCALC, TRIG, CHOLHDL, LDLDIRECT in the last 72 hours. Thyroid Function Tests: No results for input(s): TSH, T4TOTAL, FREET4, T3FREE, THYROIDAB in the last 72 hours. Anemia Panel: No results for input(s): VITAMINB12, FOLATE, FERRITIN, TIBC, IRON, RETICCTPCT in the last 72 hours. Urine analysis:    Component Value Date/Time   COLORURINE YELLOW 05/15/2016 [place] 05/15/2016 1658   LABSPEC 1.024 05/15/2016 1658   PHURINE 5.5 05/15/2016 1658   GLUCOSEU >1000 (A) 05/15/2016 1658   HGBUR TRACE (A) 05/15/2016 1658   BILIRUBINUR NEGATIVE 05/15/2016 1658   KETONESUR NEGATIVE 05/15/2016 1658   PROTEINUR 100 (A) 05/15/2016 1658   UROBILINOGEN 0.2 08/21/2015 2154   NITRITE NEGATIVE 05/15/2016 1658   LEUKOCYTESUR NEGATIVE 05/15/2016 1658    Sepsis Labs: '@LABRCNTIP' (procalcitonin:4,lacticidven:4) ) Recent Results (from the past 240 hour(s))  MRSA PCR Screening     Status: None   Collection Time: 05/09/16  3:10 AM  Result Value Ref Range Status   MRSA by PCR NEGATIVE NEGATIVE  Final    Comment:        The GeneXpert MRSA Assay (FDA approved for NASAL specimens only), is one component of a comprehensive MRSA colonization surveillance program. It is not intended to diagnose MRSA infection nor to guide or monitor treatment for MRSA infections.      Radiological Exams on Admission: Ct Head Wo Contrast  Result Date: 05/15/2016 CLINICAL DATA:  Per EMS pt comes from home for hyperglycemia and lethargy. Patient was just here couple days ago for same symptoms. Patient was sating in 80's on room air so placed on non-rebreather. Patient is able to follow commands and responds to voice. EXAM: CT HEAD WITHOUT CONTRAST TECHNIQUE: Contiguous axial images were obtained from the base of the skull through the vertex without intravenous contrast. COMPARISON:  09/25/2015 FINDINGS: The ventricles are normal in size, for this patient's age, and normal in configuration. There are no parenchymal masses or mass effect. There is no evidence of a recent infarct. Mild periventricular white matter hypoattenuation is noted consistent with chronic microvascular ischemic change. There are no extra-axial masses or abnormal fluid collections. There is no intracranial hemorrhage. There is dependent secretions in the left maxillary sinus. Visualized sinuses are otherwise clear. Clear mastoid air cells. IMPRESSION: 1. No acute intracranial abnormalities. 2. Age related volume loss. Mild chronic microvascular ischemic change. Electronically Signed   By: Lajean Manes M.D.   On: 05/15/2016 18:53   Dg Chest Port 1 View  Result Date: 05/15/2016 CLINICAL DATA:  Initial evaluation for acute hypoxia. EXAM: PORTABLE CHEST 1 VIEW COMPARISON:  Prior radiograph from  05/09/2016. FINDINGS: Mild cardiomegaly is stable. Mediastinal silhouette within normal limits. Tortuosity the intrathoracic aorta noted, stable. Lungs mildly hypoinflated. Minimal subsegmental atelectasis at the left lung base. Atelectatic changes noted along the right minor fissure as well. No consolidative airspace disease. No pulmonary edema or pleural effusion. No pneumothorax. No acute osseous abnormality. IMPRESSION: 1. Mild bilateral subsegmental atelectasis. 2. No other active cardiopulmonary disease. Electronically Signed   By: Jeannine Boga M.D.   On: 05/15/2016 18:41    EKG: Independently reviewed. Sinus rhythm, LaFB, peaked T-waves, anterior Q-wave  Assessment/Plan  1. IDDM with HHS  - Presents lethargic with serum glucose 729, normal gap and bicarb, negative urine ketones  - Suspected secondary to non-adherence with his insulin regimen given family report to this effect - 2 liters NS bolused in ED, will continue fluid-resuscitation with NS until glucose <250  - Insulin infusion started, will continue according to protocol until parameters met for transition to sq insulins  - Serial chem panels until HHS resolved  - A1c was 9.5% last month, reflecting poor glycemic-control    2. AKI superimposed on CKD stage III  - SCr 2.71 on admission, up from 1.29 just 5 days prior  - Suspect this is a prerenal azotemia in the setting of HHS and intravascular volume depletion - Anticipate improvement with fluid-resuscitation; received 2 liter NS bolus in ED, will continue IVF hydration  - Avoid nephrotoxins, hold ramipril  - Repeat chem panel in am   3. Hyponatremia  - Serum sodium 131 on admission; had been 139 on 05/10/16 - Anticipate correction with IVF  - Repeat chem panel in am    4. Normocytic anemia  - Hgb 12.6 on admission and stable relative to recent priors  - No s/s of active blood-loss - Likely secondary to CKD    5. Hypertension  - At goal currently  - Managed with  Coreg, ramipril, and  Norvasc at home  - BP meds are held on admission d/t the acute illness and concern for worsening  - Resume treatment as appropriate  6. Hx of ischemic CVA  - No new deficits  - Continue ASA, Plavix, and Lipitor for secondary PPx   7. Depression, anxiety  - Difficult to assess on admission d/t pt's lethargy  - Continue current management with prn Xanax  - No antidepressant medications noted on med list - Monitor    DVT prophylaxis: sq heparin Code Status: Full  Family Communication: Discussed with patient Disposition Plan: Admit to stepdown  Consults called: None Admission status: Inpatient     Vianne Bulls, MD [facility] Pager [phone]  If 7PM-7AM, please contact night-coverage [email or web address] Password [email or web address]  05/15/2016, 9:35 PM

## 2016-05-15 NOTE — ED Notes (Signed)
Bed: [id number] Expected date:  Expected time:  Means of arrival:  Comments: EMS- 70yo M, hyperglycemia/hypoxia

## 2016-05-15 NOTE — ED Provider Notes (Signed)
[place] DEPT Provider Note   CSN: [id number] Arrival date & time: 05/15/16  1623  First Provider Contact:  First MD Initiated Contact with Patient 05/15/16 1627        History   Chief Complaint Chief Complaint  Patient presents with  . Hyperglycemia    HPI [name] [name] is a 70 y.o. male. He has a history of insulin treated diabetes and noncompliance. Just recently discharged after an episode of hyperglycemia and DKA. Apparently for the last 2 days neighbors and friends have not seen him in concerned. Home health care nurse went to his house yesterday he did not answer the door. With a welfare check today by neighbors, place and paramedics entered his home and found him "slumped" in the corner of his bathroom. His eyes open to voice but he was confused. He was hypoxemic at 80%. Blood sugar was "high" which is greater than 600. No sinus trauma. No sign of alcohol or drug paraphernalia.  HPI  Past Medical History:  Diagnosis Date  . Diabetes mellitus without complication ([place])   . Hypertension     Patient Active Problem List   Diagnosis Date Noted  . Hyperglycemia 05/15/2016  . DKA, type 1 ([place]) 05/09/2016  . Acute on chronic renal failure ([place]) 05/09/2016  . Volume depletion 05/09/2016  . DKA (diabetic ketoacidoses) ([place]) 05/09/2016  . Chronic prescription benzodiazepine use 11/02/2015  . Narcotic dependency, continuous ([place]) 11/02/2015  . Uncontrolled type 2 diabetes mellitus with diabetic nephropathy, with long-term current use of insulin ([place]) 08/12/2015  . Benign essential HTN 08/12/2015  . Dyslipidemia associated with type 2 diabetes mellitus ([place]) 08/12/2015  . Depression 08/12/2015  . 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 CKD (chronic kidney disease) stage 3, GFR 30-59 ml/min 03/20/2014  . Anemia of chronic disease 02/02/2013    Past Surgical History:  Procedure Laterality Date  . HIP ARTHROPLASTY Right  02/05/2013   Procedure: ARTHROPLASTY BIPOLAR HIP;  Surgeon: [name], MD;  Location: [facility];  Service: Orthopedics;  Laterality: Right;       Home Medications    Prior to Admission medications   Medication Sig Start Date End Date Taking? Authorizing Provider  carvedilol (COREG) 6.25 MG tablet Take 1 tablet (6.25 mg total) by mouth 2 (two) times daily with a meal. 05/12/16  Yes Thurnell Lose, MD  insulin aspart (NOVOLOG) 100 UNIT/ML FlexPen Inject 3 Units into the skin 3 (three) times daily with meals. 03/17/16  Yes Golden Circle, [name]  insulin aspart protamine - aspart (NOVOLOG 70/30 MIX) (70-30) 100 UNIT/ML FlexPen Inject 0.27 mLs (27 Units total) into the skin 2 (two) times daily. 03/17/16  Yes Golden Circle, [name]  ramipril (ALTACE) 10 MG capsule Take 10 mg by mouth daily.  05/03/16  Yes Historical Provider, MD  acetaminophen (TYLENOL) 325 MG tablet Take 2 tablets (650 mg total) by mouth every 6 (six) hours as needed for mild pain (or Fever >/= 101). Patient not taking: Reported on 05/08/2016 08/14/15   Robbie Lis, MD  alprazolam [name]) 2 MG tablet Take 0.5 tablets (1 mg total) by mouth 2 (two) times daily as needed for anxiety. 08/24/15   Verlee Monte, MD  amLODipine (NORVASC) 10 MG tablet Take 1 tablet (10 mg total) by mouth daily. 08/14/15   Robbie Lis, MD  aspirin EC 325 MG tablet Take 1 tablet (325 mg total) by mouth daily. 06/08/15   Rosalin Hawking, MD  atorvastatin (LIPITOR) 10 MG tablet Take 1 tablet (10 mg total) by mouth daily at 6 PM. Patient taking differently: Take 10 mg by mouth daily.  03/23/15   [name], DO  blood glucose meter kit and supplies Use daily to check blood sugar. DX: E11.09 04/24/16   Golden Circle, [name]  clopidogrel (PLAVIX) 75 MG tablet Take 75 mg by mouth daily.    Historical Provider, MD  feeding supplement, GLUCERNA SHAKE, (GLUCERNA SHAKE) LIQD Take 237 mLs by mouth 2 (two) times daily between meals. Patient not taking: Reported on 05/08/2016  08/14/15   Robbie Lis, MD  gabapentin (NEURONTIN) 300 MG capsule Take 1 tablet by mouth daily for 1 day then 1 tablet by mouth twice daily for 1 day and then 1 tablet by mouth 3 times daily. Patient not taking: Reported on 05/08/2016 03/14/16   Golden Circle, [name]  glucose blood (COOL BLOOD GLUCOSE TEST STRIPS) test strip Use as instructed to check blood sugar up to 3 times a day. DX: E11.09 04/24/16   Golden Circle, [name]  Insulin Pen Needle 31G X 5 MM MISC 1 each by Does not apply route 3 (three) times daily. 03/17/16   Golden Circle, [name]  Lancets MISC Use as instructed to check blood sugar up to 3 times a day. DX: E11.09 10/16/15   Golden Circle, [name]  levofloxacin (LEVAQUIN) 750 MG tablet Take 1 tablet (750 mg total) by mouth every evening. Patient not taking: Reported on 05/08/2016 11/03/15   Nat Math, MD  thiamine 100 MG tablet Take 1 tablet (100 mg total) by mouth daily. Patient not taking: Reported on 05/08/2016 11/03/15   Nat Math, MD    Family History Family History  Problem Relation Age of Onset  . Diabetes Mother   . Alzheimer's disease Mother   . Hypertension Mother   . Hyperlipidemia Father   . Hypertension Father   . Healthy Maternal Grandmother   . Pneumonia Maternal Grandfather     Social History Social History  Substance Use Topics  . Smoking status: Former Smoker    Packs/day: 0.25    Years: 30.00    Types: Cigarettes    Quit date: 01/31/2013  . Smokeless tobacco: Never Used  . Alcohol use No     Allergies   Review of patient's allergies indicates no known allergies.   Review of Systems Review of Systems  Unable to perform ROS: Mental status change     Physical Exam Updated Vital Signs BP 117/64   Pulse 73   Temp 98.7 F (37.1 C) (Oral)   Resp 15   SpO2 99%   Physical Exam  Constitutional: He appears well-developed. No distress.  HENT:  Head: Normocephalic.    Eyes: Conjunctivae are normal. Pupils are equal, round, and reactive  to light. No scleral icterus.  Neck: Normal range of motion. Neck supple. No thyromegaly present.  Cardiovascular: Normal rate and regular rhythm.  Exam reveals no gallop and no friction rub.   No murmur heard. Pulmonary/Chest: Effort normal and breath sounds normal. No respiratory distress. He has no wheezes. He has no rales.   Clear BBS, no increased wob.  Abdominal: Soft. Bowel sounds are normal. He exhibits no distension. There is no tenderness. There is no rebound.  Musculoskeletal: Normal range of motion.  Neurological:  Eyes closed. He will awaken to voice or touch. Responses are "yes" or "no".  Moves all 4 extremities.  Skin: Skin is warm and dry.  No rash noted.  Psychiatric: He has a normal mood and affect. His behavior is normal.     ED Treatments / Results  Labs (all labs ordered are listed, but only abnormal results are displayed) Labs Reviewed  CBC WITH DIFFERENTIAL/PLATELET - Abnormal; Notable for the following:       Result Value   WBC 13.7 (*)    RBC 4.10 (*)    Hemoglobin 12.6 (*)    HCT 38.1 (*)    Neutro Abs 12.1 (*)    Lymphs Abs 0.6 (*)    Monocytes Absolute 1.1 (*)    All other components within normal limits  COMPREHENSIVE METABOLIC PANEL - Abnormal; Notable for the following:    Sodium 131 (*)    Chloride 97 (*)    Glucose, Bld 729 (*)    BUN 30 (*)    Creatinine, Ser 2.71 (*)    Calcium 8.4 (*)    ALT 16 (*)    GFR calc non Af Amer 22 (*)    GFR calc Af Amer 26 (*)    All other components within normal limits  URINALYSIS, ROUTINE W REFLEX MICROSCOPIC (NOT AT [facility]) - Abnormal; Notable for the following:    Glucose, UA >1000 (*)    Hgb urine dipstick TRACE (*)    Protein, ur 100 (*)    All other components within normal limits  CK - Abnormal; Notable for the following:    Total CK 1,102 (*)    All other components within normal limits  URINE MICROSCOPIC-ADD ON - Abnormal; Notable for the following:    Squamous Epithelial / LPF 0-5 (*)     Bacteria, UA FEW (*)    Casts HYALINE CASTS (*)    All other components within normal limits  URINE RAPID DRUG SCREEN, HOSP PERFORMED - Abnormal; Notable for the following:    Opiates POSITIVE (*)    All other components within normal limits  ACETAMINOPHEN LEVEL - Abnormal; Notable for the following:    Acetaminophen (Tylenol), Serum <10 (*)    All other components within normal limits  I-STAT CHEM 8, ED - Abnormal; Notable for the following:    Sodium 132 (*)    Chloride 96 (*)    BUN 28 (*)    Creatinine, Ser 2.40 (*)    Glucose, Bld >700 (*)    Calcium, Ion 1.09 (*)    All other components within normal limits  CBG MONITORING, ED - Abnormal; Notable for the following:    Glucose-Capillary >600 (*)    All other components within normal limits  CBG MONITORING, ED - Abnormal; Notable for the following:    Glucose-Capillary 444 (*)    All other components within normal limits  MAGNESIUM  ETHANOL  SALICYLATE LEVEL  I-STAT CG4 LACTIC ACID, ED  Randolm Idol, ED    EKG  EKG Interpretation  Date/Time:  Thursday May 15 2016 16:34:51 EDT Ventricular Rate:  97 PR Interval:    QRS Duration: 84 QT Interval:  365 QTC Calculation: 464 R Axis:   -54 Text Interpretation:  Sinus rhythm Left anterior fascicular block peaked T waves, more prominant v. 10/2015 Q waves anteriorly--old Confirmed by Jeneen Rinks  MD, [place] ([id number]) on 05/15/2016 8:20:16 PM       Radiology Ct Head Wo Contrast  Result Date: 05/15/2016 CLINICAL DATA:  Per EMS pt comes from home for hyperglycemia and lethargy. Patient was just here couple days ago for same symptoms. Patient was sating in 80's on room  air so placed on non-rebreather. Patient is able to follow commands and responds to voice. EXAM: CT HEAD WITHOUT CONTRAST TECHNIQUE: Contiguous axial images were obtained from the base of the skull through the vertex without intravenous contrast. COMPARISON:  09/25/2015 FINDINGS: The ventricles are normal in size, for  this patient's age, and normal in configuration. There are no parenchymal masses or mass effect. There is no evidence of a recent infarct. Mild periventricular white matter hypoattenuation is noted consistent with chronic microvascular ischemic change. There are no extra-axial masses or abnormal fluid collections. There is no intracranial hemorrhage. There is dependent secretions in the left maxillary sinus. Visualized sinuses are otherwise clear. Clear mastoid air cells. IMPRESSION: 1. No acute intracranial abnormalities. 2. Age related volume loss. Mild chronic microvascular ischemic change. Electronically Signed   By: Lajean Manes M.D.   On: 05/15/2016 18:53   Dg Chest Port 1 View  Result Date: 05/15/2016 CLINICAL DATA:  Initial evaluation for acute hypoxia. EXAM: PORTABLE CHEST 1 VIEW COMPARISON:  Prior radiograph from 05/09/2016. FINDINGS: Mild cardiomegaly is stable. Mediastinal silhouette within normal limits. Tortuosity the intrathoracic aorta noted, stable. Lungs mildly hypoinflated. Minimal subsegmental atelectasis at the left lung base. Atelectatic changes noted along the right minor fissure as well. No consolidative airspace disease. No pulmonary edema or pleural effusion. No pneumothorax. No acute osseous abnormality. IMPRESSION: 1. Mild bilateral subsegmental atelectasis. 2. No other active cardiopulmonary disease. Electronically Signed   By: Jeannine Boga M.D.   On: 05/15/2016 18:41    Procedures Procedures (including critical care time)  Medications Ordered in ED Medications  dextrose 5 %-0.45 % sodium chloride infusion (not administered)  insulin regular (NOVOLIN R,HUMULIN R) 250 Units in sodium chloride 0.9 % 250 mL (1 Units/mL) infusion (3.8 Units/hr Intravenous Rate/Dose Change 05/15/16 2012)  sodium chloride 0.9 % bolus 1,000 mL (0 mLs Intravenous Stopped 05/15/16 1900)  0.9 %  sodium chloride infusion ( Intravenous Stopped 05/15/16 1900)  sodium chloride 0.9 % bolus 1,000 mL  (1,000 mLs Intravenous New Bag/Given 05/15/16 1900)     Initial Impression / Assessment and Plan / ED Course  I have reviewed the triage vital signs and the nursing notes.  Pertinent labs & imaging results that were available during my care of the patient were reviewed by me and considered in my medical decision making (see chart for details).  Clinical Course    Patient placed on glucose stabilizer. Given a liter bolus of fluid and a second liter while in the emergency room and continued on 100 per hour. Recheck is 444. His mental status is improving. He has opiates in his urine. No acetaminophen level or elevation of liver enzymes of suggest ingestion. Negative alcohol. Atraumatic CT. Normal chest x-ray. I discussed the case with Dr. Candace Cruise. Per the patient be admitted to stepdown unit continuing on the glucose stabilizer.  Final Clinical Impressions(s) / ED Diagnoses   Final diagnoses:  Hyperglycemia  Hyperosmolar coma ([place])     CRITICAL CARE Performed by: Tanna Furry JOSEPH   Total critical care time: 60 minutes  Critical care time was exclusive of separately billable procedures and treating other patients.  Critical care was necessary to treat or prevent imminent or life-threatening deterioration.  Critical care was time spent personally by me on the following activities: development of treatment plan with patient and/or surrogate as well as nursing, discussions with consultants, evaluation of patient's response to treatment, examination of patient, obtaining history from patient or surrogate, ordering and performing treatments and interventions,  ordering and review of laboratory studies, ordering and review of radiographic studies, pulse oximetry and re-evaluation of patient's condition.  New Prescriptions New Prescriptions   No medications on file     Tanna Furry, MD 05/15/16 2041

## 2016-05-15 NOTE — ED Notes (Signed)
729 glucose, primary nurse made aware of same.

## 2016-05-15 NOTE — ED Notes (Signed)
Patient transported to CT 

## 2016-05-15 NOTE — Telephone Encounter (Signed)
Called pt no answer x's 10 rings.../lmb 

## 2016-05-15 NOTE — ED Triage Notes (Signed)
Per EMS pt comes from home for hyperglycemia and lethargy.  Patient was just here couple days ago for same symptoms.  home health went to patient's home yesterday but patient didn't go to door so they were unable to complete visit.  Per EMS bystander hadnt seen patient in couple days so called 911.  Fire was first to home where patient lives alone and was found lethargic curled up in corner of bathroom floor.  Patient was sating in 80's on room air so placed on non-rebreather.  Patient is able to follow commands and responds to voice.

## 2016-05-16 DIAGNOSIS — E1100 Type 2 diabetes mellitus with hyperosmolarity without nonketotic hyperglycemic-hyperosmolar coma (NKHHC): Principal | ICD-10-CM

## 2016-05-16 LAB — BASIC METABOLIC PANEL
Anion gap: 10 (ref 5–15)
Anion gap: 7 (ref 5–15)
Anion gap: 9 (ref 5–15)
Anion gap: 9 (ref 5–15)
BUN: 23 mg/dL — ABNORMAL HIGH (ref 6–20)
BUN: 26 mg/dL — ABNORMAL HIGH (ref 6–20)
BUN: 28 mg/dL — ABNORMAL HIGH (ref 6–20)
BUN: 30 mg/dL — ABNORMAL HIGH (ref 6–20)
CO2: 19 mmol/L — ABNORMAL LOW (ref 22–32)
CO2: 20 mmol/L — ABNORMAL LOW (ref 22–32)
CO2: 23 mmol/L (ref 22–32)
CO2: 23 mmol/L (ref 22–32)
Calcium: 7.9 mg/dL — ABNORMAL LOW (ref 8.9–10.3)
Calcium: 7.9 mg/dL — ABNORMAL LOW (ref 8.9–10.3)
Calcium: 8 mg/dL — ABNORMAL LOW (ref 8.9–10.3)
Calcium: 8.1 mg/dL — ABNORMAL LOW (ref 8.9–10.3)
Chloride: 104 mmol/L (ref 101–111)
Chloride: 107 mmol/L (ref 101–111)
Chloride: 109 mmol/L (ref 101–111)
Chloride: 110 mmol/L (ref 101–111)
Creatinine, Ser: 2.36 mg/dL — ABNORMAL HIGH (ref 0.61–1.24)
Creatinine, Ser: 2.43 mg/dL — ABNORMAL HIGH (ref 0.61–1.24)
Creatinine, Ser: 2.55 mg/dL — ABNORMAL HIGH (ref 0.61–1.24)
Creatinine, Ser: 2.63 mg/dL — ABNORMAL HIGH (ref 0.61–1.24)
GFR calc Af Amer: 27 mL/min — ABNORMAL LOW (ref >60)
GFR calc Af Amer: 28 mL/min — ABNORMAL LOW (ref >60)
GFR calc Af Amer: 29 mL/min — ABNORMAL LOW (ref >60)
GFR calc Af Amer: 30 mL/min — ABNORMAL LOW (ref >60)
GFR calc non Af Amer: 23 mL/min — ABNORMAL LOW (ref >60)
GFR calc non Af Amer: 24 mL/min — ABNORMAL LOW (ref >60)
GFR calc non Af Amer: 25 mL/min — ABNORMAL LOW (ref >60)
GFR calc non Af Amer: 26 mL/min — ABNORMAL LOW (ref >60)
Glucose, Bld: 100 mg/dL — ABNORMAL HIGH (ref 65–99)
Glucose, Bld: 143 mg/dL — ABNORMAL HIGH (ref 65–99)
Glucose, Bld: 153 mg/dL — ABNORMAL HIGH (ref 65–99)
Glucose, Bld: 216 mg/dL — ABNORMAL HIGH (ref 65–99)
Potassium: 3.8 mmol/L (ref 3.5–5.1)
Potassium: 4 mmol/L (ref 3.5–5.1)
Potassium: 4.3 mmol/L (ref 3.5–5.1)
Potassium: 4.5 mmol/L (ref 3.5–5.1)
Sodium: 134 mmol/L — ABNORMAL LOW (ref 135–145)
Sodium: 138 mmol/L (ref 135–145)
Sodium: 139 mmol/L (ref 135–145)
Sodium: 139 mmol/L (ref 135–145)

## 2016-05-16 LAB — GLUCOSE, CAPILLARY
Glucose-Capillary: 149 mg/dL — ABNORMAL HIGH (ref 65–99)
Glucose-Capillary: 185 mg/dL — ABNORMAL HIGH (ref 65–99)
Glucose-Capillary: 243 mg/dL — ABNORMAL HIGH (ref 65–99)
Glucose-Capillary: 92 mg/dL (ref 65–99)
Glucose-Capillary: 106 mg/dL — ABNORMAL HIGH (ref 65–99)
Glucose-Capillary: 107 mg/dL — ABNORMAL HIGH (ref 65–99)
Glucose-Capillary: 200 mg/dL — ABNORMAL HIGH (ref 65–99)
Glucose-Capillary: 135 mg/dL — ABNORMAL HIGH (ref 65–99)
Glucose-Capillary: 94 mg/dL (ref 65–99)

## 2016-05-16 MED ORDER — INSULIN ASPART PROT & ASPART (70-30 MIX) 100 UNIT/ML ~~LOC~~ SUSP
27.0000 [IU] | Freq: Two times a day (BID) | SUBCUTANEOUS | Status: DC
  Filled 2016-05-16: qty 10

## 2016-05-16 MED ORDER — INSULIN ASPART 100 UNIT/ML ~~LOC~~ SOLN
0.0000 [IU] | SUBCUTANEOUS | Status: DC
  Administered 2016-05-16: 3 [IU] via SUBCUTANEOUS

## 2016-05-16 MED ORDER — INSULIN ASPART 100 UNIT/ML ~~LOC~~ SOLN: 0.0000 [IU] | SUBCUTANEOUS | Status: DC

## 2016-05-16 MED ORDER — INSULIN ASPART PROT & ASPART (70-30 MIX) 100 UNIT/ML ~~LOC~~ SUSP
24.0000 [IU] | Freq: Two times a day (BID) | SUBCUTANEOUS | Status: DC
  Administered 2016-05-16 – 2016-05-17 (×2): 24 [IU] via SUBCUTANEOUS
  Filled 2016-05-16: qty 10

## 2016-05-16 MED ORDER — ONDANSETRON HCL 4 MG/2ML IJ SOLN
4.0000 mg | Freq: Four times a day (QID) | INTRAMUSCULAR | Status: DC | PRN
  Administered 2016-05-16: 4 mg via INTRAVENOUS
  Filled 2016-05-16: qty 2

## 2016-05-16 MED ORDER — INSULIN ASPART 100 UNIT/ML ~~LOC~~ SOLN
0.0000 [IU] | Freq: Every day | SUBCUTANEOUS | Status: DC
  Administered 2016-05-16: 3 [IU] via SUBCUTANEOUS

## 2016-05-16 MED ORDER — INSULIN ASPART 100 UNIT/ML ~~LOC~~ SOLN: 0.0000 [IU] | Freq: Three times a day (TID) | SUBCUTANEOUS | Status: DC

## 2016-05-16 MED ORDER — AMLODIPINE BESYLATE 10 MG PO TABS
10.0000 mg | ORAL_TABLET | Freq: Every day | ORAL | Status: DC
  Administered 2016-05-16 – 2016-05-17 (×2): 10 mg via ORAL
  Filled 2016-05-16 (×2): qty 1

## 2016-05-16 MED ORDER — CARVEDILOL 6.25 MG PO TABS
6.2500 mg | ORAL_TABLET | Freq: Two times a day (BID) | ORAL | Status: DC
  Administered 2016-05-16 – 2016-05-17 (×2): 6.25 mg via ORAL
  Filled 2016-05-16 (×2): qty 1

## 2016-05-16 MED ORDER — SODIUM CHLORIDE 0.9 % IV BOLUS (SEPSIS)
500.0000 mL | Freq: Once | INTRAVENOUS | Status: AC
  Administered 2016-05-16: 500 mL via INTRAVENOUS

## 2016-05-16 MED ORDER — INSULIN ASPART 100 UNIT/ML ~~LOC~~ SOLN
0.0000 [IU] | Freq: Three times a day (TID) | SUBCUTANEOUS | Status: DC
  Administered 2016-05-16 – 2016-05-17 (×2): 1 [IU] via SUBCUTANEOUS

## 2016-05-16 MED ORDER — INSULIN ASPART 100 UNIT/ML ~~LOC~~ SOLN: 0.0000 [IU] | Freq: Every day | SUBCUTANEOUS | Status: DC

## 2016-05-16 MED ORDER — ONDANSETRON HCL 4 MG/2ML IJ SOLN: 4.0000 mg | Freq: Four times a day (QID) | INTRAMUSCULAR | Status: DC | PRN

## 2016-05-16 MED ORDER — INSULIN ASPART PROT & ASPART (70-30 MIX) 100 UNIT/ML ~~LOC~~ SUSP
15.0000 [IU] | Freq: Two times a day (BID) | SUBCUTANEOUS | Status: DC
  Administered 2016-05-16: 15 [IU] via SUBCUTANEOUS
  Filled 2016-05-16: qty 10

## 2016-05-16 MED ORDER — SODIUM CHLORIDE 0.9 % IV SOLN
INTRAVENOUS | Status: AC
  Administered 2016-05-16: 10:00:00 via INTRAVENOUS

## 2016-05-16 MED ORDER — ACETAMINOPHEN 325 MG PO TABS: 650.0000 mg | ORAL_TABLET | Freq: Four times a day (QID) | ORAL | Status: DC | PRN

## 2016-05-16 NOTE — Progress Notes (Signed)
Inpatient Diabetes Program Recommendations  AACE/ADA: New Consensus Statement on Inpatient Glycemic Control (2015)  Target Ranges:  Prepandial:   less than 140 mg/dL      Peak postprandial:   less than 180 mg/dL (1-2 hours)      Critically ill patients:  140 - 180 mg/dL   Lab Results  Component Value Date   GLUCAP 106 (H) 05/16/2016   HGBA1C 9.5 (H) 05/09/2016    Review of Glycemic Control  Discharged from hospital on 7/31 for same.   Inpatient Diabetes Program Recommendations:    Increase 70/30 to 24 units bid.  Will continue to follow. Thank you. Lorenda Peck, RD, LDN, CDE Inpatient Diabetes Coordinator [phone]

## 2016-05-16 NOTE — Evaluation (Signed)
Physical Therapy Evaluation Patient Details Name: [name] [name] MRN: [id number] DOB: 22-Mar-1946 Today's Date: 05/16/2016   History of Present Illness  70 y/o male admitted with hyperglycemia with BS > 600.  PMH includes multiople admissions for same dx and DM, CKD, HTN, depression  Clinical Impression  Pt admitted as above and presenting with functional mobility limitations 2* generalized weakness, balance deficits and questionable safety awareness.  Pt eager to dc home and would benefit from follow up HHPT to further address deficits and initial 24/7 assist for safety.    Follow Up Recommendations Home health PT;Supervision/Assistance - 24 hour    Equipment Recommendations  Rolling walker with 5" wheels    Recommendations for Other Services       Precautions / Restrictions Precautions Precautions: Fall Restrictions Weight Bearing Restrictions: No      Mobility  Bed Mobility Overal bed mobility: Modified Independent             General bed mobility comments: bed flat without rails  Transfers Overall transfer level: Needs assistance Equipment used: None Transfers: Sit to/from Stand Sit to Stand: Min guard         General transfer comment: to stabilize with initial standing  Ambulation/Gait Ambulation/Gait assistance: Min assist;Min guard Ambulation Distance (Feet): 200 Feet Assistive device: None;Rolling walker (2 wheeled) Gait Pattern/deviations: Step-through pattern;Decreased step length - right;Decreased step length - left;Shuffle;Trunk flexed;Wide base of support     General Gait Details: Step through pattern, but lacks arm swing and lacks heel to toe pattern.  Loss of balance noted with pt attempting to scratch face while walking and turning corner.  Trial with RW for a portion of gait and pt stated he did feel a bit steadier, but reluctant to use at home.  Stairs            Wheelchair Mobility    Modified Rankin (Stroke Patients Only)        Balance Overall balance assessment: Needs assistance Sitting-balance support: No upper extremity supported;Feet supported Sitting balance-Leahy Scale: Good     Standing balance support: No upper extremity supported Standing balance-Leahy Scale: Fair                               Pertinent Vitals/Pain Pain Assessment: No/denies pain    Home Living Family/patient expects to be discharged to:: Private residence Living Arrangements: Alone Available Help at Discharge: Family;Available PRN/intermittently Type of Home: Apartment Home Access: Level entry;Elevator     Home Layout: One level Home Equipment: None Additional Comments: does not drive. Sister takes to the grocery store    Prior Function Level of Independence: Independent         Comments: Pt denies falls at home but has scabs and scars on knees and nose suggesting otherwise     Hand Dominance   Dominant Hand: Right    Extremity/Trunk Assessment   Upper Extremity Assessment: Generalized weakness           Lower Extremity Assessment: Generalized weakness      Cervical / Trunk Assessment: Normal  Communication   Communication: No difficulties  Cognition Arousal/Alertness: Awake/alert Behavior During Therapy: WFL for tasks assessed/performed;Impulsive Overall Cognitive Status: Within Functional Limits for tasks assessed                      General Comments      Exercises        Assessment/Plan  PT Assessment Patient needs continued PT services  PT Diagnosis Difficulty walking   PT Problem List Decreased mobility;Decreased balance;Decreased strength;Decreased knowledge of use of DME;Decreased safety awareness  PT Treatment Interventions Gait training;Functional mobility training;Therapeutic activities;Therapeutic exercise   PT Goals (Current goals can be found in the Care Plan section) Acute Rehab PT Goals Patient Stated Goal: Resume previous lifestyle PT Goal  Formulation: With patient Time For Goal Achievement: 05/17/16 Potential to Achieve Goals: Good    Frequency Min 3X/week   Barriers to discharge Decreased caregiver support Pt has dtr and sister who check in and assist prn    Co-evaluation               End of Session Equipment Utilized During Treatment: Gait belt Activity Tolerance: Patient tolerated treatment well Patient left: in bed;with call bell/phone within reach;with nursing/sitter in room Nurse Communication: Mobility status         Time: [id number] PT Time Calculation (min) (ACUTE ONLY): 19 min   Charges:   PT Evaluation $PT Eval Low Complexity: 1 Procedure     PT G Codes:        Joniyah Mallinger 05-25-2016, 1:56 PM

## 2016-05-16 NOTE — Progress Notes (Signed)
Patient has not voided since arrival on unit at 2230.  Bladder scan showed 25cc of urine. MD paged. Orders for 500cc bolus NS given.

## 2016-05-16 NOTE — Progress Notes (Addendum)
Patient ID: [name] [name], male   DOB: 07-Mar-1946, 70 y.o.   MRN: [id number]    PROGRESS NOTE    Keygan Kingery  [id number] DOB: 1946-08-29 DOA: 05/15/2016  PCP: Mauricio Po, [name]   Brief Narrative:   70 y.o. male with medical history significant for insulin-dependent diabetes mellitus, hypertension, hyperlipidemia, ischemic CVA in 2016, and chronic kidney disease stage III who presented to the emergency department after being found lethargic at home with CBG "high." Patient was just discharged from the hospital on 05/12/2016 after management for DKA that was attributed to the patient not being able to find his insulin for several days. Patient was discharged home in much improved and stable condition with resolution of the associated AKI. Home health was arranged for the patient upon discharge, but when they went to visit the patient today, there was no answer at the door.   Assessment & Plan:   1. IDDM with HHS, complications of nephropathy and neuropathy  - Suspected secondary to non-adherence with his insulin regimen given family report to this effect - better controlled CBG's, Ok to d/c insulin drip and transition to long acting insulin - Diabetic educator consulted  - A1c was 9.5% last month, reflecting poor glycemic-control   - BMP in AM  2. AKI superimposed on CKD stage III  - SCr 2.71 on admission, up from 1.29 just 5 days prior  - Suspect this is a prerenal azotemia in the setting of HHS and intravascular volume depletion - Avoid nephrotoxins, hold ramipril  - continue IVF and repeat BMP in AM  3. Hyponatremia  - improving with IVF, BMP in AM  4. Normocytic anemia due to CKD - Hgb 12.6 on admission and stable relative to recent priors  - No s/s of active blood-loss  5. Hypertension  - reasonable inpatient control   6. Hx of ischemic CVA  - No new deficits  - Continue ASA, Plavix, and Lipitor for secondary PPx   7. Depression, anxiety  - stable this AM   DVT  prophylaxis: Heparin Sq Code Status: Full  Family Communication: Patient at bedside  Disposition Plan: Home in 1-2 days  Consultants:   None  Procedures:   None  Antimicrobials:   None     Subjective: Reports feeling better, wants to go home.   Objective: Vitals:   05/16/16 0600 05/16/16 0800 05/16/16 1000 05/16/16 1200  BP: 132/61     Pulse:      Resp: (!) 9  14   Temp:  98.6 F (37 C)  99 F (37.2 C)  TempSrc:  Oral  Oral  SpO2: 97%  (!) 89%   Weight:      Height:        Intake/Output Summary (Last 24 hours) at 05/16/16 1440 Last data filed at 05/16/16 1320  Gross per 24 hour  Intake          1760.88 ml  Output              400 ml  Net          1360.88 ml   Filed Weights   05/15/16 2207  Weight: 65.1 kg (143 lb 8.3 oz)    Examination:  General exam: Appears calm and comfortable  Respiratory system: Clear to auscultation. Respiratory effort normal. Cardiovascular system: S1 & S2 heard, RRR. No JVD, rubs, gallops or clicks. No pedal edema. Gastrointestinal system: Abdomen is nondistended, soft and nontender.  Central nervous system: Alert and oriented. No focal neurological  deficits. Extremities: Symmetric 5 x 5 power.    Data Reviewed: I have personally reviewed following labs and imaging studies  CBC:  Recent Labs Lab 05/15/16 1716 05/15/16 1731  WBC 13.7*  --   NEUTROABS 12.1*  --   HGB 12.6* 13.9  HCT 38.1* 41.0  MCV 92.9  --   PLT 254  --    Basic Metabolic Panel:  Recent Labs Lab 05/10/16 0545 05/15/16 1716 05/15/16 1731 05/15/16 2358 05/16/16 0337 05/16/16 0759 05/16/16 1139  NA 139 131* 132* 139 139 138 134*  K 3.7 5.1 5.1 4.5 4.3 4.0 3.8  CL 112* 97* 96* 109 107 110 104  CO2 21* 23  --  23 23 19* 20*  GLUCOSE 67 729* >700* 153* 143* 100* 216*  BUN 16 30* 28* 30* 28* 26* 23*  CREATININE 1.28* 2.71* 2.40* 2.55* 2.63* 2.43* 2.36*  CALCIUM 8.1* 8.4*  --  8.1* 8.0* 7.9* 7.9*  MG 2.1 2.3  --   --   --   --   --     GFR: Estimated Creatinine Clearance: 26.4 mL/min (by C-G formula based on SCr of 2.36 mg/dL). Liver Function Tests:  Recent Labs Lab 05/15/16 1716  AST 27  ALT 16*  ALKPHOS 121  BILITOT 0.6  PROT 7.4  ALBUMIN 4.1   Cardiac Enzymes:  Recent Labs Lab 05/15/16 1716  CKTOTAL 1,102*   CBG:  Recent Labs Lab 05/16/16 0107 05/16/16 0210 05/16/16 0420 05/16/16 0757 05/16/16 1256  GLUCAP 107* 92 200* 106* 135*   Urine analysis:    Component Value Date/Time   COLORURINE YELLOW 05/15/2016 [place] 05/15/2016 1658   LABSPEC 1.024 05/15/2016 1658   PHURINE 5.5 05/15/2016 1658   GLUCOSEU >1000 (A) 05/15/2016 1658   HGBUR TRACE (A) 05/15/2016 1658   BILIRUBINUR NEGATIVE 05/15/2016 1658   KETONESUR NEGATIVE 05/15/2016 1658   PROTEINUR 100 (A) 05/15/2016 1658   UROBILINOGEN 0.2 08/21/2015 2154   NITRITE NEGATIVE 05/15/2016 1658   LEUKOCYTESUR NEGATIVE 05/15/2016 1658     ) Recent Results (from the past 240 hour(s))  MRSA PCR Screening     Status: None   Collection Time: 05/09/16  3:10 AM  Result Value Ref Range Status   MRSA by PCR NEGATIVE NEGATIVE Final    Comment:        The GeneXpert MRSA Assay (FDA approved for NASAL specimens only), is one component of a comprehensive MRSA colonization surveillance program. It is not intended to diagnose MRSA infection nor to guide or monitor treatment for MRSA infections.   MRSA PCR Screening     Status: None   Collection Time: 05/15/16 10:11 PM  Result Value Ref Range Status   MRSA by PCR NEGATIVE NEGATIVE Final    Comment:        The GeneXpert MRSA Assay (FDA approved for NASAL specimens only), is one component of a comprehensive MRSA colonization surveillance program. It is not intended to diagnose MRSA infection nor to guide or monitor treatment for MRSA infections.       Radiology Studies: Ct Head Wo Contrast  Result Date: 05/15/2016 CLINICAL DATA:  Per EMS pt comes from home for  hyperglycemia and lethargy. Patient was just here couple days ago for same symptoms. Patient was sating in 80's on room air so placed on non-rebreather. Patient is able to follow commands and responds to voice. EXAM: CT HEAD WITHOUT CONTRAST TECHNIQUE: Contiguous axial images were obtained from the base of the skull through  the vertex without intravenous contrast. COMPARISON:  09/25/2015 FINDINGS: The ventricles are normal in size, for this patient's age, and normal in configuration. There are no parenchymal masses or mass effect. There is no evidence of a recent infarct. Mild periventricular white matter hypoattenuation is noted consistent with chronic microvascular ischemic change. There are no extra-axial masses or abnormal fluid collections. There is no intracranial hemorrhage. There is dependent secretions in the left maxillary sinus. Visualized sinuses are otherwise clear. Clear mastoid air cells. IMPRESSION: 1. No acute intracranial abnormalities. 2. Age related volume loss. Mild chronic microvascular ischemic change. Electronically Signed   By: Lajean Manes M.D.   On: 05/15/2016 18:53   Dg Chest Port 1 View  Result Date: 05/15/2016 CLINICAL DATA:  Initial evaluation for acute hypoxia. EXAM: PORTABLE CHEST 1 VIEW COMPARISON:  Prior radiograph from 05/09/2016. FINDINGS: Mild cardiomegaly is stable. Mediastinal silhouette within normal limits. Tortuosity the intrathoracic aorta noted, stable. Lungs mildly hypoinflated. Minimal subsegmental atelectasis at the left lung base. Atelectatic changes noted along the right minor fissure as well. No consolidative airspace disease. No pulmonary edema or pleural effusion. No pneumothorax. No acute osseous abnormality. IMPRESSION: 1. Mild bilateral subsegmental atelectasis. 2. No other active cardiopulmonary disease. Electronically Signed   By: Jeannine Boga M.D.   On: 05/15/2016 18:41      Scheduled Meds: . aspirin EC  325 mg Oral Daily  . atorvastatin   10 mg Oral Daily  . clopidogrel  75 mg Oral Daily  . heparin  5,000 Units Subcutaneous Q8H  . insulin aspart  0-5 Units Subcutaneous QHS  . insulin aspart  0-9 Units Subcutaneous TID WC  . insulin aspart protamine- aspart  15 Units Subcutaneous BID   Continuous Infusions: . sodium chloride 75 mL/hr at 05/16/16 1019     LOS: 1 day    Time spent: 20 minutes   Faye Ramsay, MD [facility] Pager [phone]  If 7PM-7AM, please contact night-coverage [email or web address] Password [email or web address] 05/16/2016, 2:40 PM

## 2016-05-16 NOTE — Telephone Encounter (Signed)
Tried calling pt again still no answer closing encounter...[name] [name]

## 2016-05-16 NOTE — Progress Notes (Signed)
[facility]  Patient Status: Active (receiving services up to time of hospitalization)  [facility] is providing the following services: RN, PT and MSW  If patient discharges after hours, please call [phone].   [name] [name] 05/16/2016, 11:48 AM

## 2016-05-17 LAB — GLUCOSE, CAPILLARY
Glucose-Capillary: 290 mg/dL — ABNORMAL HIGH (ref 65–99)
Glucose-Capillary: 13 mg/dL — CL (ref 65–99)
Glucose-Capillary: 142 mg/dL — ABNORMAL HIGH (ref 65–99)
Glucose-Capillary: 93 mg/dL (ref 65–99)
Glucose-Capillary: 72 mg/dL (ref 65–99)

## 2016-05-17 LAB — CBC
HCT: 32.8 % — ABNORMAL LOW (ref 39.0–52.0)
Hemoglobin: 10.8 g/dL — ABNORMAL LOW (ref 13.0–17.0)
MCH: 30.3 pg (ref 26.0–34.0)
MCHC: 32.9 g/dL (ref 30.0–36.0)
MCV: 91.9 fL (ref 78.0–100.0)
Platelets: 201 10*3/uL (ref 150–400)
RBC: 3.57 MIL/uL — ABNORMAL LOW (ref 4.22–5.81)
RDW: 13.5 % (ref 11.5–15.5)
WBC: 8.1 10*3/uL (ref 4.0–10.5)

## 2016-05-17 LAB — BASIC METABOLIC PANEL
Anion gap: 6 (ref 5–15)
BUN: 19 mg/dL (ref 6–20)
CO2: 22 mmol/L (ref 22–32)
Calcium: 8.1 mg/dL — ABNORMAL LOW (ref 8.9–10.3)
Chloride: 110 mmol/L (ref 101–111)
Creatinine, Ser: 1.84 mg/dL — ABNORMAL HIGH (ref 0.61–1.24)
GFR calc Af Amer: 41 mL/min — ABNORMAL LOW (ref >60)
GFR calc non Af Amer: 35 mL/min — ABNORMAL LOW (ref >60)
Glucose, Bld: 54 mg/dL — ABNORMAL LOW (ref 65–99)
Potassium: 3.8 mmol/L (ref 3.5–5.1)
Sodium: 138 mmol/L (ref 135–145)

## 2016-05-17 MED ORDER — INSULIN ASPART PROT & ASPART (70-30 MIX) 100 UNIT/ML PEN: 24.0000 [IU] | PEN_INJECTOR | Freq: Two times a day (BID) | SUBCUTANEOUS | 1 refills | Status: DC

## 2016-05-17 MED ORDER — DEXTROSE 50 % IV SOLN
INTRAVENOUS | Status: AC
  Administered 2016-05-17: 50 mL
  Filled 2016-05-17: qty 50

## 2016-05-17 NOTE — Progress Notes (Signed)
At about 1315, call received from [place], pt's sister wanting to know what was going on with pt. [name] reported that pt was not responding appropriately. [name] reported that she could not "get anything out of pt" and wondered what had happened. Pt's blood sugar at  1123 was 72. Pt's lunch order was placed and pt was awaiting lunch tray, to eat lunch before leaving unit.  Pt was given discharge instructions at 1213. Pt assisted to put clothes on by this rn. Pt dressed and ready; but was  awaiting lunch tray to eat  and subsequently sister to pick him up after eating. Nursing secretary called pt's sister with estimated time for pt to be picked up depending on when tray was to be delivered. Unbeknown to this writer, Pt left unit without eating. Lunch tray arrived to flr after pt had left unit. Sister made aware that pt had not eaten and that maybe he was having a hypoglycemic event, probable reason for his abrupt mental status change. Pt's sister encouraged to have pt drink some juice and eat some food as quickly as possible.  Dr. Doyle Askew made aware. Said for pt's sister to call her with questions and concerns. Called pt's sister  back and spoke with pt's younger  Bebe Shaggy,  at [phone], who said pt was doing better, more responsive  and eating. Reported faye was not available. Encouraged pt's sister to have pt's blood sugar checked routinely as as needed with any physical or mental status changes. Also for pt to only take 70/30 insulin with a meal at breakfast and supper, per order. Sister also made aware that lantus insulin should not be given and that Lantus had been discontinued.  Sister voiced understanding.

## 2016-05-17 NOTE — Discharge Instructions (Signed)
Blood Glucose Monitoring, Adult ° °Monitoring your blood glucose (also know as blood sugar) helps you to manage your diabetes. It also helps you and your health care provider monitor your diabetes and determine how well your treatment plan is working. °WHY SHOULD YOU MONITOR YOUR BLOOD GLUCOSE? °· It can help you understand how food, exercise, and medicine affect your blood glucose. °· It allows you to know what your blood glucose is at any given moment. You can quickly tell if you are having low blood glucose (hypoglycemia) or high blood glucose (hyperglycemia). °· It can help you and your health care provider know how to adjust your medicines. °· It can help you understand how to manage an illness or adjust medicine for exercise. °WHEN SHOULD YOU TEST? °Your health care provider will help you decide how often you should check your blood glucose. This may depend on the type of diabetes you have, your diabetes control, or the types of medicines you are taking. Be sure to write down all of your blood glucose readings so that this information can be reviewed with your health care provider. See below for examples of testing times that your health care provider may suggest. °Type 1 Diabetes °· Test at least 2 times per day if your diabetes is well controlled, if you are using an insulin pump, or if you perform multiple daily injections. °· If your diabetes is not well controlled or if you are sick, you may need to test more often. °· It is a good idea to also test: °¨ Before every insulin injection. °¨ Before and after exercise. °¨ Between meals and 2 hours after a meal. °¨ Occasionally between 2:00 a.m. and 3:00 a.m. °Type 2 Diabetes °· If you are taking insulin, test at least 2 times per day. However, it is best to test before every insulin injection. °· If you take medicines by mouth (orally), test 2 times a day. °· If you are on a controlled diet, test once a day. °· If your diabetes is not well controlled or if you  are sick, you may need to monitor more often. °HOW TO MONITOR YOUR BLOOD GLUCOSE °Supplies Needed °· Blood glucose meter. °· Test strips for your meter. Each meter has its own strips. You must use the strips that go with your own meter. °· A pricking needle (lancet). °· A device that holds the lancet (lancing device). °· A journal or log book to write down your results. °Procedure °· Wash your hands with soap and water. Alcohol is not preferred. °· Prick the side of your finger (not the tip) with the lancet. °· Gently milk the finger until a small drop of blood appears. °· Follow the instructions that come with your meter for inserting the test strip, applying blood to the strip, and using your blood glucose meter. °Other Areas to Get Blood for Testing °Some meters allow you to use other areas of your body (other than your finger) to test your blood. These areas are called alternative sites. The most common alternative sites are: °· The forearm. °· The thigh. °· The back area of the lower leg. °· The palm of the hand. °The blood flow in these areas is slower. Therefore, the blood glucose values you get may be delayed, and the numbers are different from what you would get from your fingers. Do not use alternative sites if you think you are having hypoglycemia. Your reading will not be accurate. Always use a finger if you   are having hypoglycemia. Also, if you cannot feel your lows (hypoglycemia unawareness), always use your fingers for your blood glucose checks. °ADDITIONAL TIPS FOR GLUCOSE MONITORING °· Do not reuse lancets. °· Always carry your supplies with you. °· All blood glucose meters have a 24-hour "hotline" number to call if you have questions or need help. °· Adjust (calibrate) your blood glucose meter with a control solution after finishing a few boxes of strips. °BLOOD GLUCOSE RECORD KEEPING °It is a good idea to keep a daily record or log of your blood glucose readings. Most glucose meters, if not all,  keep your glucose records stored in the meter. Some meters come with the ability to download your records to your home computer. Keeping a record of your blood glucose readings is especially helpful if you are wanting to look for patterns. Make notes to go along with the blood glucose readings because you might forget what happened at that exact time. Keeping good records helps you and your health care provider to work together to achieve good diabetes management.  °  °This information is not intended to replace advice given to you by your health care provider. Make sure you discuss any questions you have with your health care provider. °  °Document Released: 10/02/2003 Document Revised: 10/20/2014 Document Reviewed: 02/21/2013 °Elsevier Interactive Patient Education ©2016 [employer] ° °

## 2016-05-17 NOTE — Progress Notes (Signed)
RN entered room at [phone].  Pt noted to be lethargic, incomprehensible speech, and unable to follow commands.  CBG checked x2 reading 19 and 13.  1 amp D50 administered immediately.  Pt now alert and oriented x4. CBG recheck at 0700 143.

## 2016-05-17 NOTE — Progress Notes (Signed)
Inpatient Diabetes Program Recommendations  AACE/ADA: New Consensus Statement on Inpatient Glycemic Control (2015)  Target Ranges:  Prepandial:   less than 140 mg/dL      Peak postprandial:   less than 180 mg/dL (1-2 hours)      Critically ill patients:  140 - 180 mg/dL   Results for SABREE, ZAVALA (MRN [id number]) as of 05/17/2016 09:26  Ref. Range 05/16/2016 22:58 05/17/2016 06:47 05/17/2016 06:50 05/17/2016 07:05 05/17/2016 08:10  Glucose-Capillary Latest Ref Range: 65 - 99 mg/dL 290 (H) 19 (LL) 13 (LL) 142 (H) 93    Inpatient Diabetes Program Recommendations:  Noted am CBG 13. Sent note to pharmacy requesting change in 70/30 to ac breakfast and dinner.  Thank you, Nani Gasser. Pacey Willadsen, RN, MSN, CDE Inpatient Glycemic Control Team Team Pager [phone] (8am-5pm) 05/17/2016 9:36 AM

## 2016-05-17 NOTE — Progress Notes (Signed)
Pt discharged to home. Left unit in wheelchair pushed by nurse tech. Left in good condition. VWilliams,rn.

## 2016-05-17 NOTE — Progress Notes (Signed)
Discharge instructions given. No concerns voiced. Pt encouraged to stop by [facility] and pick up insulin. Pt acknowledges same. Awaiting lunch and then ride to be transported to home. VWilliams,rn.

## 2016-05-17 NOTE — Discharge Summary (Signed)
Physician Discharge Summary  [name] [name] KZS:[id number] DOB: 19-Aug-1946 DOA: 05/15/2016  PCP: Mauricio Po, [name]  Admit date: 05/15/2016 Discharge date: 05/17/2016  Recommendations for Outpatient Follow-up:  1. Pt will need to follow up with PCP in 1-2 weeks post discharge 2. Please obtain BMP to evaluate electrolytes and kidney function 3. Please note that Lantus was stopped due to hypoglycemic episodes  4. Since pt insisted on going home he was advised to continue to monitor sugar levels and to report sugar lows to his PCP or myself so that we can appropriately advise home or I can call his PCP  Discharge Diagnoses:  Principal Problem:   Diabetic hyperosmolar non-ketotic state ([place]) Active Problems:   Anemia of chronic disease   CKD (chronic kidney disease) stage 3, GFR 30-59 ml/min  Discharge Condition: Stable  Diet recommendation: Heart healthy diet discussed in details   Brief Narrative:   70 y.o.malewith medical history significant for insulin-dependent diabetes mellitus, hypertension, hyperlipidemia, ischemic CVA in 2016, and chronic kidney disease stage III who presented to the emergency department after being found lethargic at home with CBG "high." Patient was just discharged from the hospital on 05/12/2016 after management for DKA that was attributed to the patient not being able to find his insulin for several days. Patient was discharged home in much improved and stable condition with resolution of the associated AKI. Home health was arranged for the patient upon discharge, but when they went to visit the patient today, there was no answer at the door.   Assessment & Plan:   1. IDDM with HHS, complications of nephropathy and neuropathy  - Suspected secondary to non-adherence with his insulin regimen given family report to this effect - better controlled CBG's, insulin drip d/c  - Diabetic educator consulted  - A1c was 9.5% last month, reflecting poor  glycemic-control  - pt tolerating diet well and very adamant about going home   2. AKI superimposed on CKD stage III  - SCr 2.71 on admission, up from 1.29 just 5 days prior  - Suspect this is a prerenal azotemia in the setting of HHS and intravascular volume depletion - improved with IVF and oral intake   3. Hyponatremia  - improved   4. Normocytic anemia due to CKD - No s/s of active blood-loss - no needs for transfusion while hospitalized   5. Hypertension  - reasonable inpatient control   6. Hx of ischemic CVA  - No new deficits  - Continue ASA, Plavix, and Lipitor for secondary PPx   7. Depression, anxiety  - stable this AM   DVT prophylaxis: Heparin Sq Code Status: Full  Family Communication: Patient at bedside, gave him my cell phone to have family call me for update, he said he will update family Disposition Plan: Home   Consultants:   None  Procedures:   None  Antimicrobials:   None    Procedures/Studies: Ct Head Wo Contrast  Result Date: 05/15/2016 CLINICAL DATA:  Per EMS pt comes from home for hyperglycemia and lethargy. Patient was just here couple days ago for same symptoms. Patient was sating in 80's on room air so placed on non-rebreather. Patient is able to follow commands and responds to voice. EXAM: CT HEAD WITHOUT CONTRAST TECHNIQUE: Contiguous axial images were obtained from the base of the skull through the vertex without intravenous contrast. COMPARISON:  09/25/2015 FINDINGS: The ventricles are normal in size, for this patient's age, and normal in configuration. There are no parenchymal masses  or mass effect. There is no evidence of a recent infarct. Mild periventricular white matter hypoattenuation is noted consistent with chronic microvascular ischemic change. There are no extra-axial masses or abnormal fluid collections. There is no intracranial hemorrhage. There is dependent secretions in the left maxillary sinus. Visualized sinuses  are otherwise clear. Clear mastoid air cells. IMPRESSION: 1. No acute intracranial abnormalities. 2. Age related volume loss. Mild chronic microvascular ischemic change. Electronically Signed   By: Lajean Manes M.D.   On: 05/15/2016 18:53   Dg Chest Port 1 View  Result Date: 05/15/2016 CLINICAL DATA:  Initial evaluation for acute hypoxia. EXAM: PORTABLE CHEST 1 VIEW COMPARISON:  Prior radiograph from 05/09/2016. FINDINGS: Mild cardiomegaly is stable. Mediastinal silhouette within normal limits. Tortuosity the intrathoracic aorta noted, stable. Lungs mildly hypoinflated. Minimal subsegmental atelectasis at the left lung base. Atelectatic changes noted along the right minor fissure as well. No consolidative airspace disease. No pulmonary edema or pleural effusion. No pneumothorax. No acute osseous abnormality. IMPRESSION: 1. Mild bilateral subsegmental atelectasis. 2. No other active cardiopulmonary disease. Electronically Signed   By: Jeannine Boga M.D.   On: 05/15/2016 18:41   Portable Chest X-ray (1 View)  Result Date: 05/09/2016 CLINICAL DATA:  Diabetes. EXAM: PORTABLE CHEST 1 VIEW COMPARISON:  CT 11/02/2015.  Chest x-ray 11/01/2015. FINDINGS: Mediastinum and hilar structures normal. Cardiomegaly with normal pulmonary vascularity. Mild subsegmental atelectasis and or infiltrate right lung base cannot be excluded. No pleural effusion or pneumothorax. IMPRESSION: Mild right base subsegmental atelectasis and or infiltrate cannot be excluded. Electronically Signed   By: Marcello Moores  Register   On: 05/09/2016 06:48   Discharge Exam: Vitals:   05/17/16 0800 05/17/16 0944  BP: (!) 145/58 129/65  Pulse:    Resp:    Temp: 98.6 F (37 C)    Vitals:   05/17/16 0400 05/17/16 0600 05/17/16 0800 05/17/16 0944  BP: (!) 146/68 (!) 150/71 (!) 145/58 129/65  Pulse:      Resp: 12 11    Temp: 98.2 F (36.8 C)  98.6 F (37 C)   TempSrc: Oral  Oral   SpO2: 97% 99% 100%   Weight:      Height:         General: Pt is alert, follows commands appropriately, not in acute distress Cardiovascular: Regular rate and rhythm, S1/S2 +, no rubs, no gallops Respiratory: Clear to auscultation bilaterally, no wheezing, no crackles, no rhonchi Abdominal: Soft, non tender, non distended, bowel sounds +, no guarding   Discharge Instructions  Discharge Instructions    Diet - low sodium heart healthy    Complete by:  As directed   Increase activity slowly    Complete by:  As directed       Medication List    STOP taking these medications   insulin glargine 100 UNIT/ML injection Commonly known as:  LANTUS     TAKE these medications   acetaminophen 325 MG tablet Commonly known as:  TYLENOL Take 2 tablets (650 mg total) by mouth every 6 (six) hours as needed for mild pain (or Fever >/= 101).   alprazolam 2 MG tablet Commonly known as:  XANAX Take 0.5 tablets (1 mg total) by mouth 2 (two) times daily as needed for anxiety.   amLODipine 10 MG tablet Commonly known as:  NORVASC Take 1 tablet (10 mg total) by mouth daily.   aspirin EC 325 MG tablet Take 1 tablet (325 mg total) by mouth daily.   atorvastatin 10 MG tablet Commonly known  as:  LIPITOR Take 1 tablet (10 mg total) by mouth daily at 6 PM. What changed:  when to take this   blood glucose meter kit and supplies Use daily to check blood sugar. DX: E11.09   carvedilol 6.25 MG tablet Commonly known as:  COREG Take 1 tablet (6.25 mg total) by mouth 2 (two) times daily with a meal.   clopidogrel 75 MG tablet Commonly known as:  PLAVIX Take 75 mg by mouth daily.   feeding supplement (GLUCERNA SHAKE) Liqd Take 237 mLs by mouth 2 (two) times daily between meals.   gabapentin 300 MG capsule Commonly known as:  NEURONTIN Take 1 tablet by mouth daily for 1 day then 1 tablet by mouth twice daily for 1 day and then 1 tablet by mouth 3 times daily.   glucose blood test strip Commonly known as:  COOL BLOOD GLUCOSE TEST STRIPS Use  as instructed to check blood sugar up to 3 times a day. DX: E11.09   insulin aspart 100 UNIT/ML FlexPen Commonly known as:  NOVOLOG Inject 3 Units into the skin 3 (three) times daily with meals.   insulin aspart protamine - aspart (70-30) 100 UNIT/ML FlexPen Commonly known as:  NOVOLOG 70/30 MIX Inject 0.24 mLs (24 Units total) into the skin 2 (two) times daily. What changed:  how much to take   Insulin Pen Needle 31G X 5 MM Misc 1 each by Does not apply route 3 (three) times daily.   Lancets Misc Use as instructed to check blood sugar up to 3 times a day. DX: E11.09   ramipril 10 MG capsule Commonly known as:  ALTACE Take 10 mg by mouth daily.   thiamine 100 MG tablet Take 1 tablet (100 mg total) by mouth daily.      Follow-up Information    Mauricio Po, [name] .   Specialty:  Family Medicine Contact information: [place] [address] [phone]        Faye Ramsay, MD .   Specialty:  Internal Medicine Why:  call my cell [phone] Contact information: [address] [place] [address] [phone]            The results of significant diagnostics from this hospitalization (including imaging, microbiology, ancillary and laboratory) are listed below for reference.     Microbiology: Recent Results (from the past 240 hour(s))  MRSA PCR Screening     Status: None   Collection Time: 05/09/16  3:10 AM  Result Value Ref Range Status   MRSA by PCR NEGATIVE NEGATIVE Final    Comment:        The GeneXpert MRSA Assay (FDA approved for NASAL specimens only), is one component of a comprehensive MRSA colonization surveillance program. It is not intended to diagnose MRSA infection nor to guide or monitor treatment for MRSA infections.   MRSA PCR Screening     Status: None   Collection Time: 05/15/16 10:11 PM  Result Value Ref Range Status   MRSA by PCR NEGATIVE NEGATIVE Final    Comment:        The GeneXpert  MRSA Assay (FDA approved for NASAL specimens only), is one component of a comprehensive MRSA colonization surveillance program. It is not intended to diagnose MRSA infection nor to guide or monitor treatment for MRSA infections.      Labs: Basic Metabolic Panel:  Recent Labs Lab 05/15/16 1716  05/15/16 2358 05/16/16 0337 05/16/16 0759 05/16/16 1139 05/17/16 0335  NA 131*  < >  139 139 138 134* 138  K 5.1  < > 4.5 4.3 4.0 3.8 3.8  CL 97*  < > 109 107 110 104 110  CO2 23  --  23 23 19* 20* 22  GLUCOSE 729*  < > 153* 143* 100* 216* 54*  BUN 30*  < > 30* 28* 26* 23* 19  CREATININE 2.71*  < > 2.55* 2.63* 2.43* 2.36* 1.84*  CALCIUM 8.4*  --  8.1* 8.0* 7.9* 7.9* 8.1*  MG 2.3  --   --   --   --   --   --   < > = values in this interval not displayed. Liver Function Tests:  Recent Labs Lab 05/15/16 1716  AST 27  ALT 16*  ALKPHOS 121  BILITOT 0.6  PROT 7.4  ALBUMIN 4.1   CBC:  Recent Labs Lab 05/15/16 1716 05/15/16 1731 05/17/16 0335  WBC 13.7*  --  8.1  NEUTROABS 12.1*  --   --   HGB 12.6* 13.9 10.8*  HCT 38.1* 41.0 32.8*  MCV 92.9  --  91.9  PLT 254  --  201   Cardiac Enzymes:  Recent Labs Lab 05/15/16 1716  CKTOTAL 1,102*   CBG:  Recent Labs Lab 05/16/16 2258 05/17/16 0647 05/17/16 0650 05/17/16 0705 05/17/16 0810  GLUCAP 290* 19* 13* 142* 93   SIGNED: Time coordinating discharge:  30 minutes  MAGICK-Gauri Galvao, MD  [facility] 05/17/2016, 11:34 AM Pager [phone]  If 7PM-7AM, please contact night-coverage [email or web address] Password [email or web address]

## 2016-05-19 ENCOUNTER — Telehealth: Payer: Self-pay

## 2016-05-19 LAB — GLUCOSE, CAPILLARY: Glucose-Capillary: 19 mg/dL — CL (ref 65–99)

## 2016-05-19 NOTE — Telephone Encounter (Signed)
appt scheduled

## 2016-05-19 NOTE — Telephone Encounter (Addendum)
Pt is on TCM List.   Spoke to [place] (pt sister).   Directed to contact dtr Georganna Skeans) for appt scheduling at [phone]

## 2016-05-19 NOTE — Telephone Encounter (Signed)
Transition Care Management Follow-up Telephone Call   Date discharged? 05/17/2016   How have you been since you were released from the hospital? Spoke to pt sister. She states that patient has been resting. They have been checking BS regularly and the readings have some what stable/normal.    Do you understand why you were in the hospital? Pt sister does understand the reason pt was admitted.    Do you understand the discharge instructions? Pt sister states that they did not receive any DC instructions.    Where were you discharged to? Home   Items Reviewed:  Medications reviewed: There are questions  Allergies reviewed: No new allergies  Dietary changes reviewed: Diet recommendations are the same.   Referrals reviewed: Pt sister informed of referral.   Functional Questionnaire:   Activities of Daily Living (ADLs):   States they are independent in the following: Patient is currently more bed bound due to fatigue.  States they require assistance with the following: Most   Any transportation issues/concerns?: None   Any patient concerns? Several but wants to address the Terri Piedra   Confirmed importance and date/time of follow-up visits scheduled - Yes  Provider Appointment booked with Terri Piedra  Confirmed with patient if condition begins to worsen call PCP or go to the ER.  Patient was given the office number and encouraged to call back with question or concerns.  : Yes

## 2016-05-25 IMAGING — CT CT HEAD W/O CM
2 series · 16 of 30 positions shown, 20 images · non-contrast
Comparison: 03/19/2014

CLINICAL DATA: Recent fall, altered mental status

EXAM:
CT HEAD WITHOUT CONTRAST
TECHNIQUE: Contiguous axial images were obtained from the base of the skull
through the vertex without intravenous contrast.

[Series 201: head w/o, idose (1) · axial · non-contrast · 0.49mm/px · z∈[+82,+207]mm · 13 of 31 slices shown, 17 images]
[im 3/31  brain]
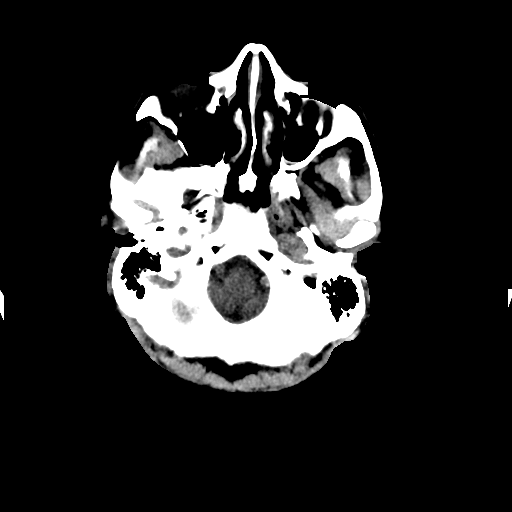
[im 3/31  bone]
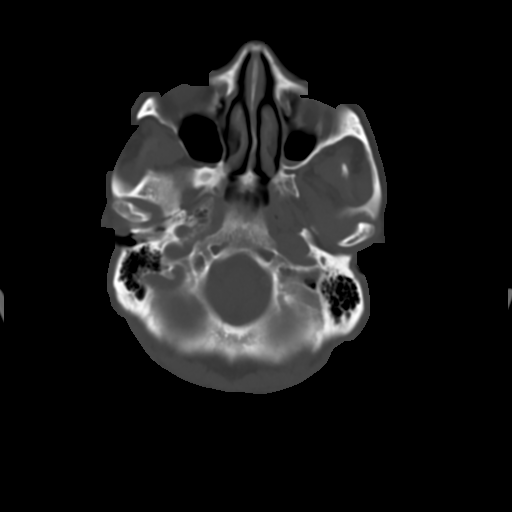
[im 5/31  brain]
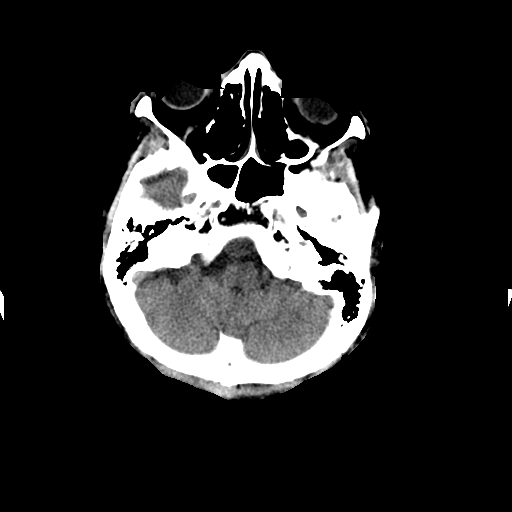
[im 7/31  brain]
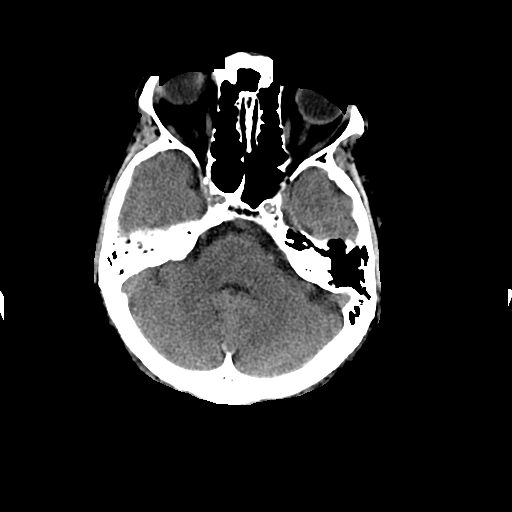
[im 9/31  brain]
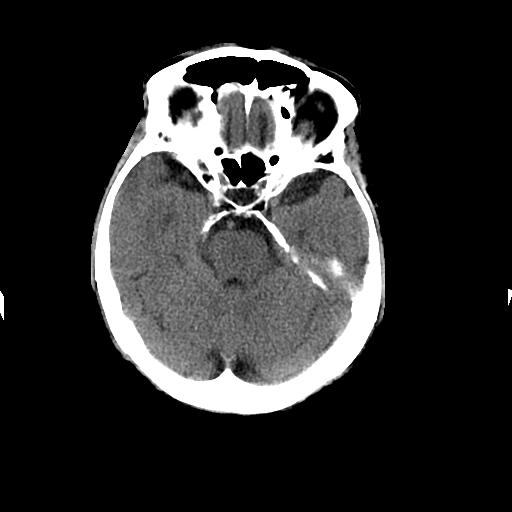
[im 11/31  brain]
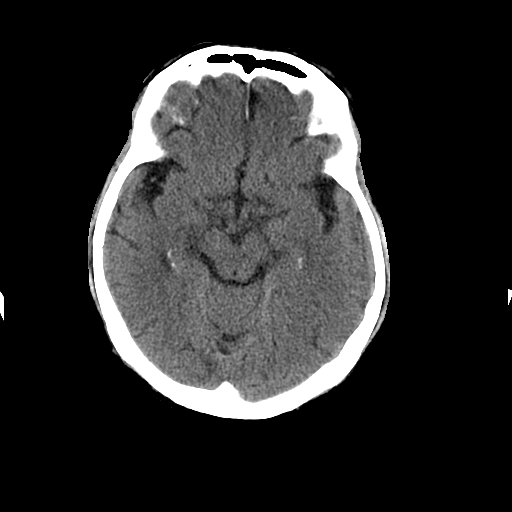
[im 11/31  bone]
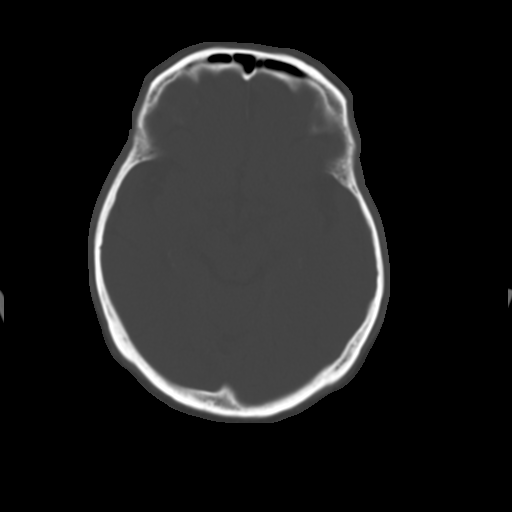
[im 13/31  brain]
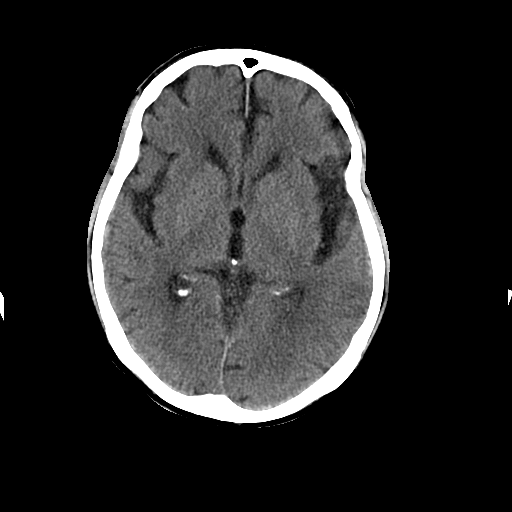
[im 16/31  brain]
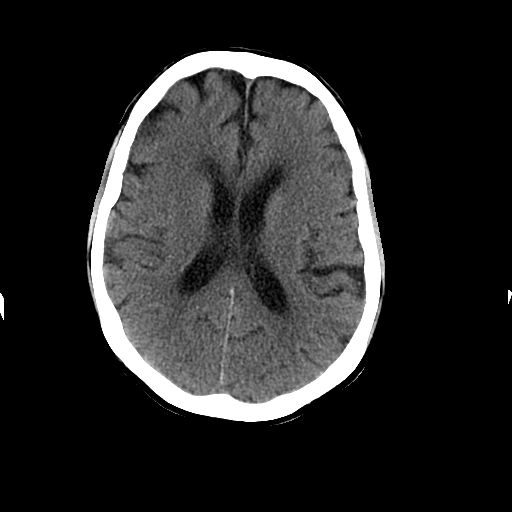
[im 18/31  brain]
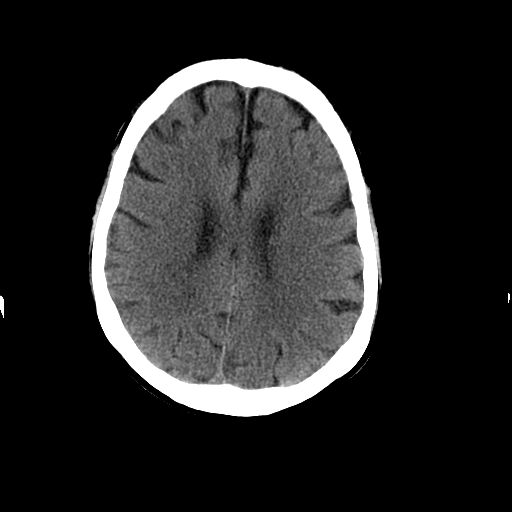
[im 20/31  brain]
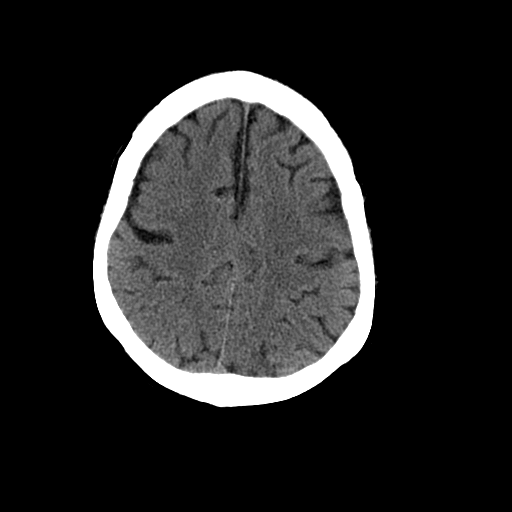
[im 20/31  bone]
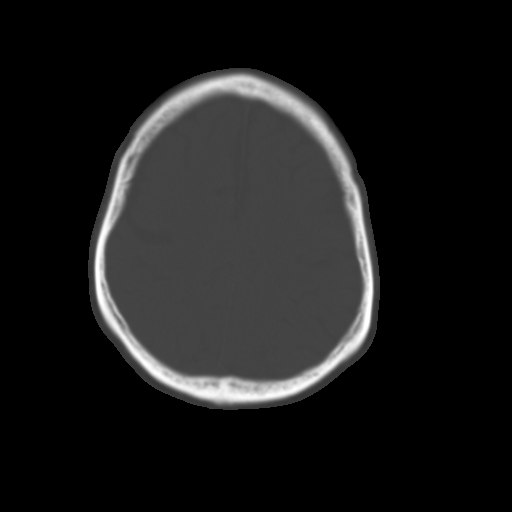
[im 22/31  brain]
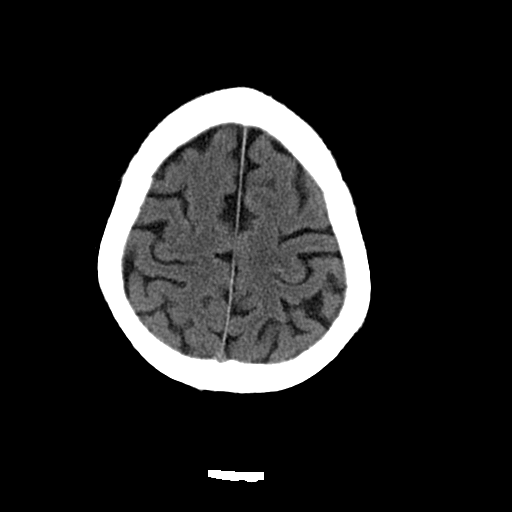
[im 24/31  brain]
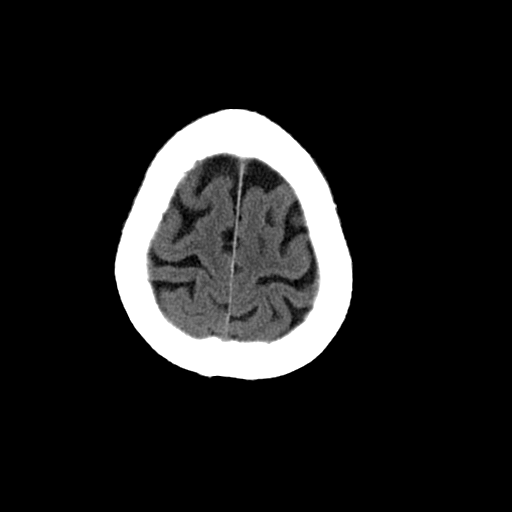
[im 26/31  brain]
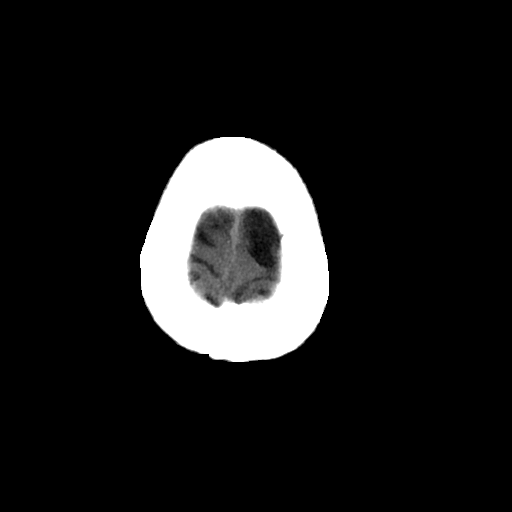
[im 28/31  brain]
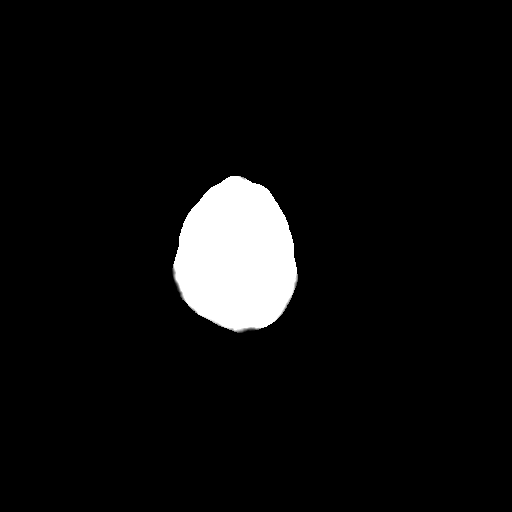
[im 28/31  bone]
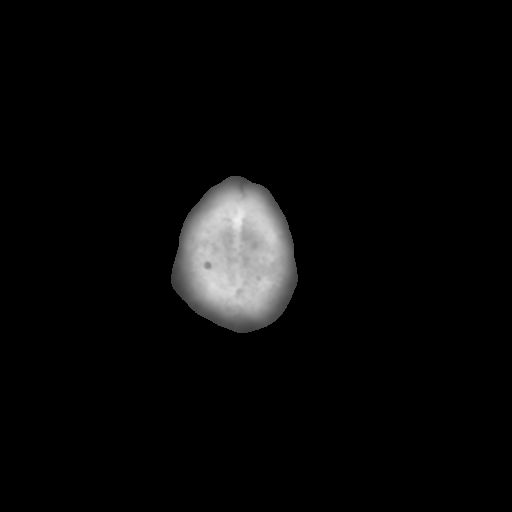

[Series 202: head w/o bone, idose (1) · axial · non-contrast · 0.49mm/px · z∈[+82,+122]mm · 3 of 31 slices shown]
[im 3/31  bone]
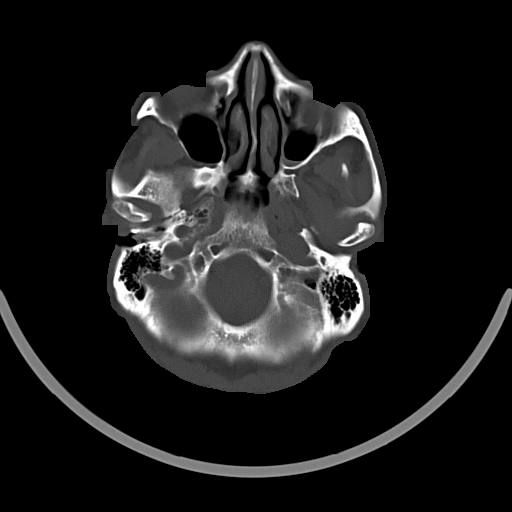
[im 7/31  bone]
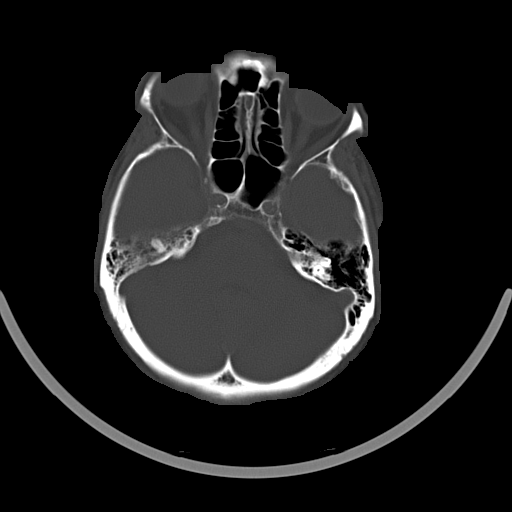
[im 11/31  bone]
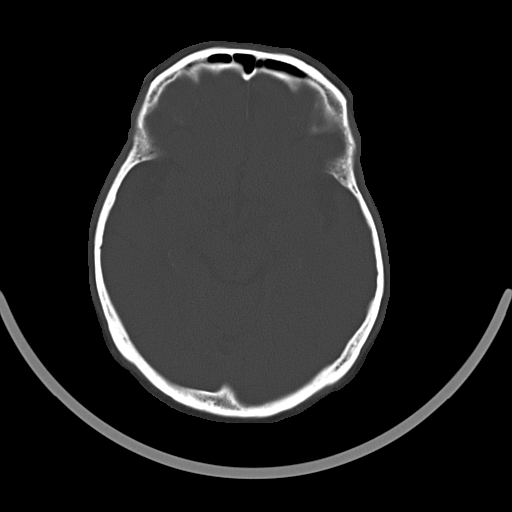

[16 of 30 positions shown; findings below may reference images not displayed]

FINDINGS: Bony calvarium is intact. The paranasal sinuses and mastoid air
cells are well aerated. Mild atrophic changes are seen. No findings
to suggest acute hemorrhage, acute infarction or space-occupying
mass lesion are noted.
IMPRESSION: Chronic atrophic changes without acute abnormality.

## 2016-05-25 IMAGING — CR DG CHEST 1V PORT
1 series · 1 of 1 positions shown · non-contrast
Comparison: 03/19/2014

CLINICAL DATA: Fever for 1 day

EXAM:
PORTABLE CHEST - 1 VIEW

[AP]
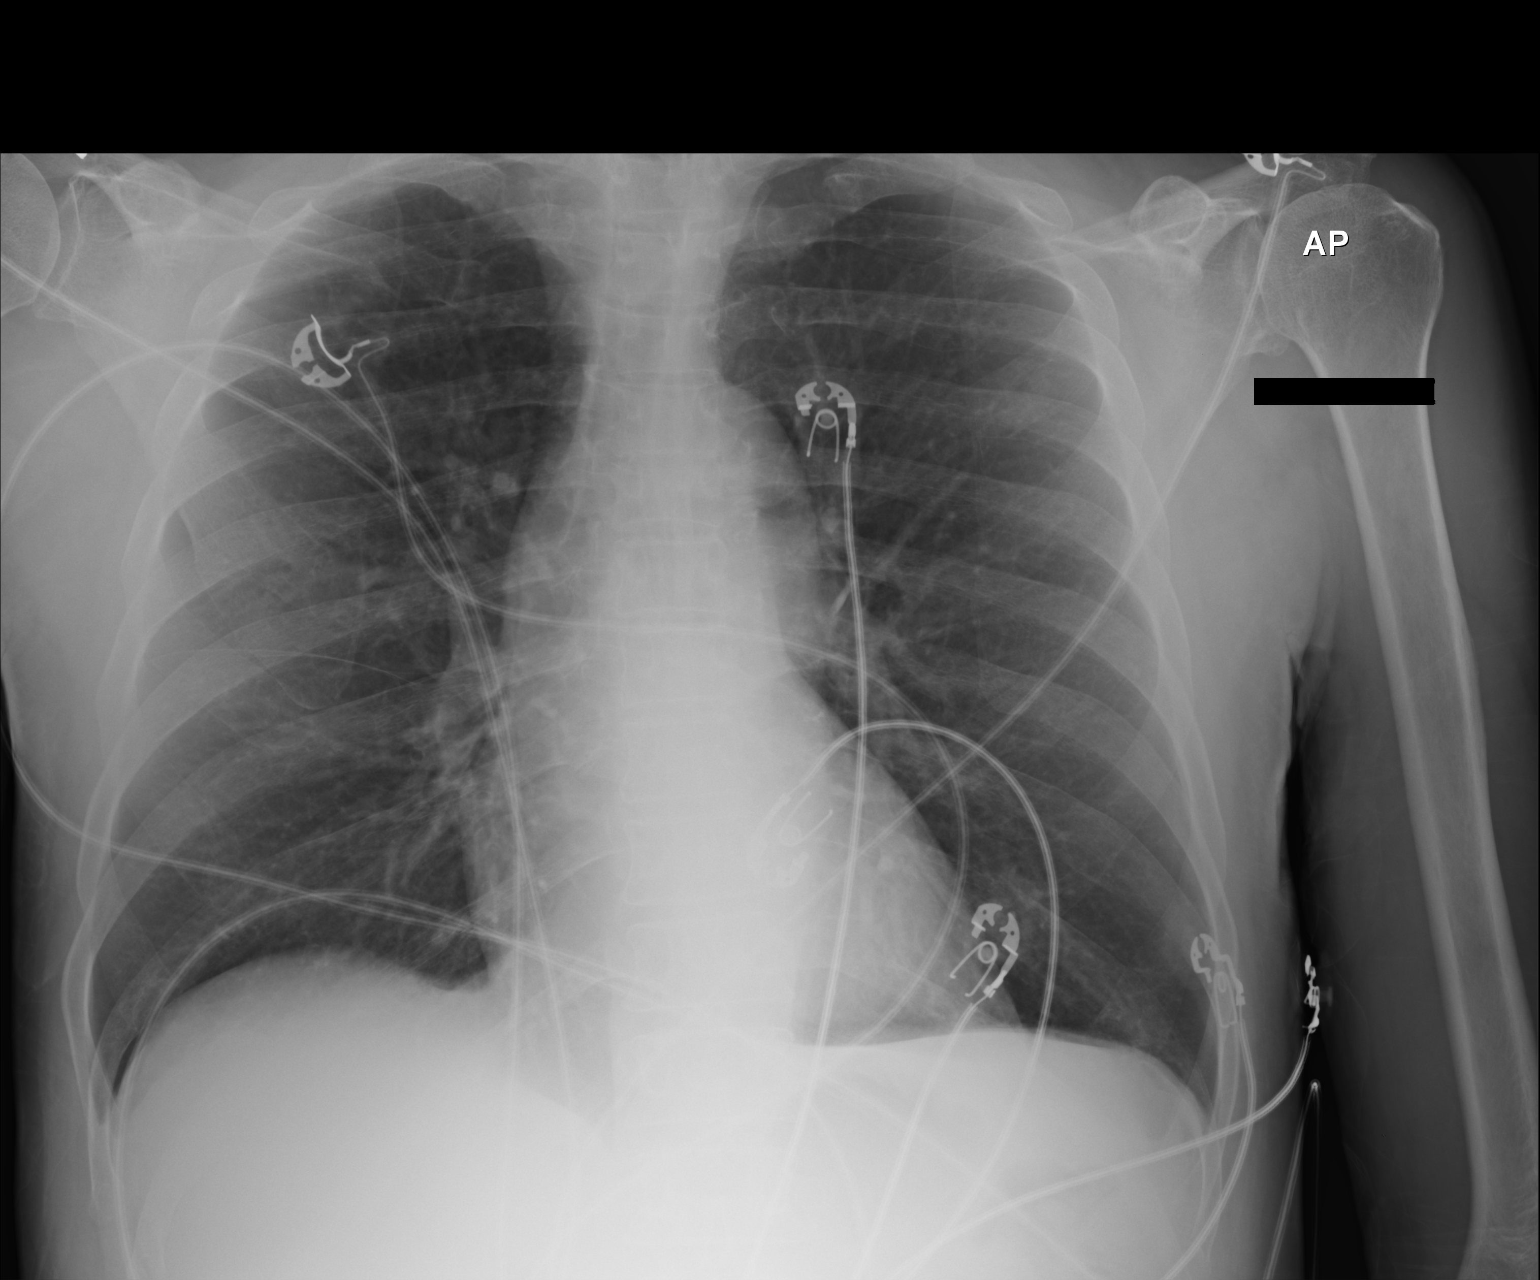

[1 of 1 positions shown; findings below may reference images not displayed]

FINDINGS: The heart size and mediastinal contours are within normal limits.
Both lungs are clear. The visualized skeletal structures are
unremarkable.
IMPRESSION: No active disease.

## 2016-05-25 IMAGING — CR DG LUMBAR SPINE COMPLETE 4+V
5 series · 5 of 5 positions shown · non-contrast
Comparison: None.

CLINICAL DATA: Altered mental status. Fell earlier today in his
[REDACTED] home.

EXAM:
LUMBAR SPINE - COMPLETE 4+ VIEW

[l-spine ap]
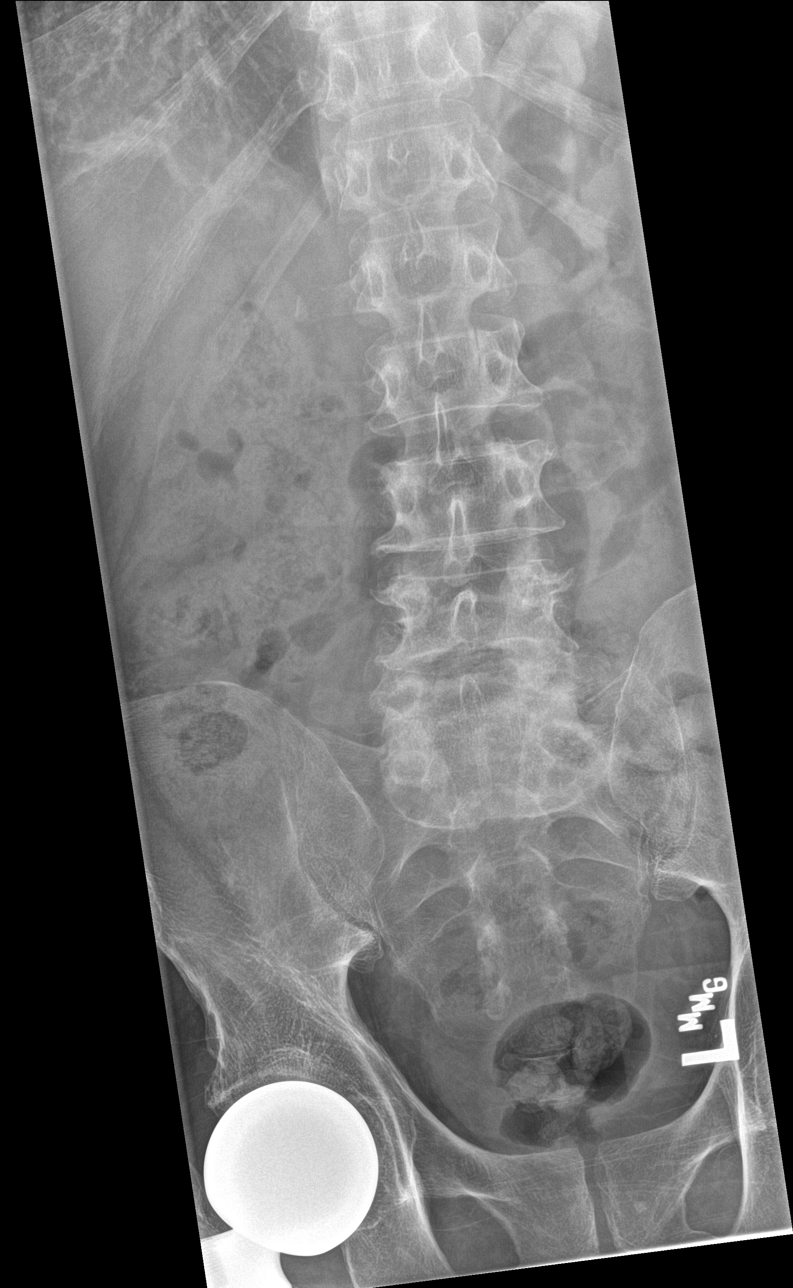

[l-spine obl (1 of 2)]
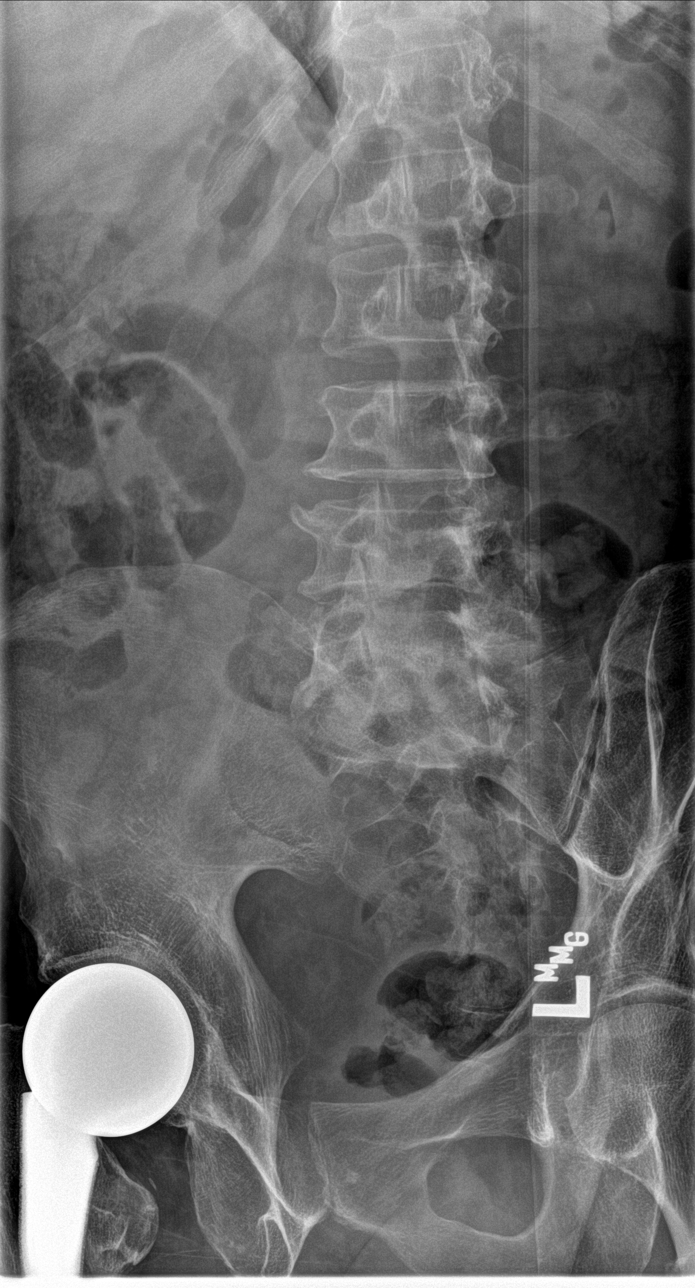

[l-spine obl (2 of 2)]
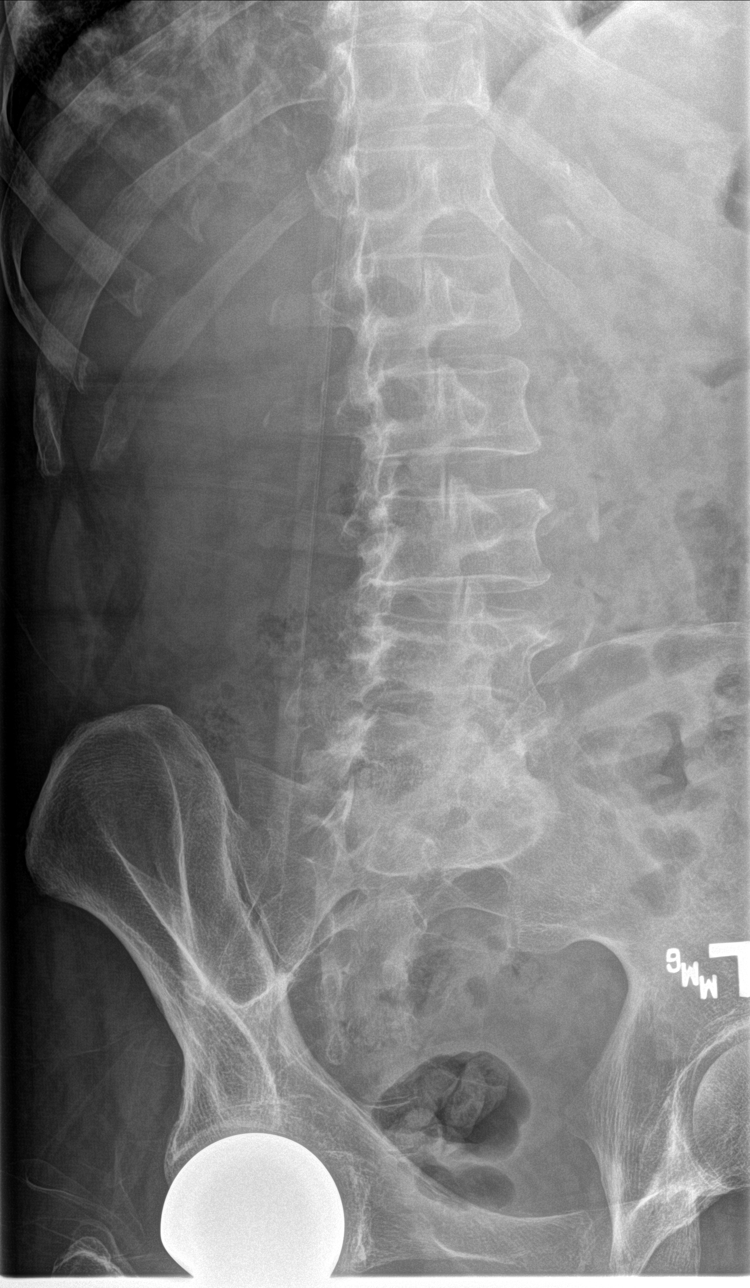

[l-spine lat]
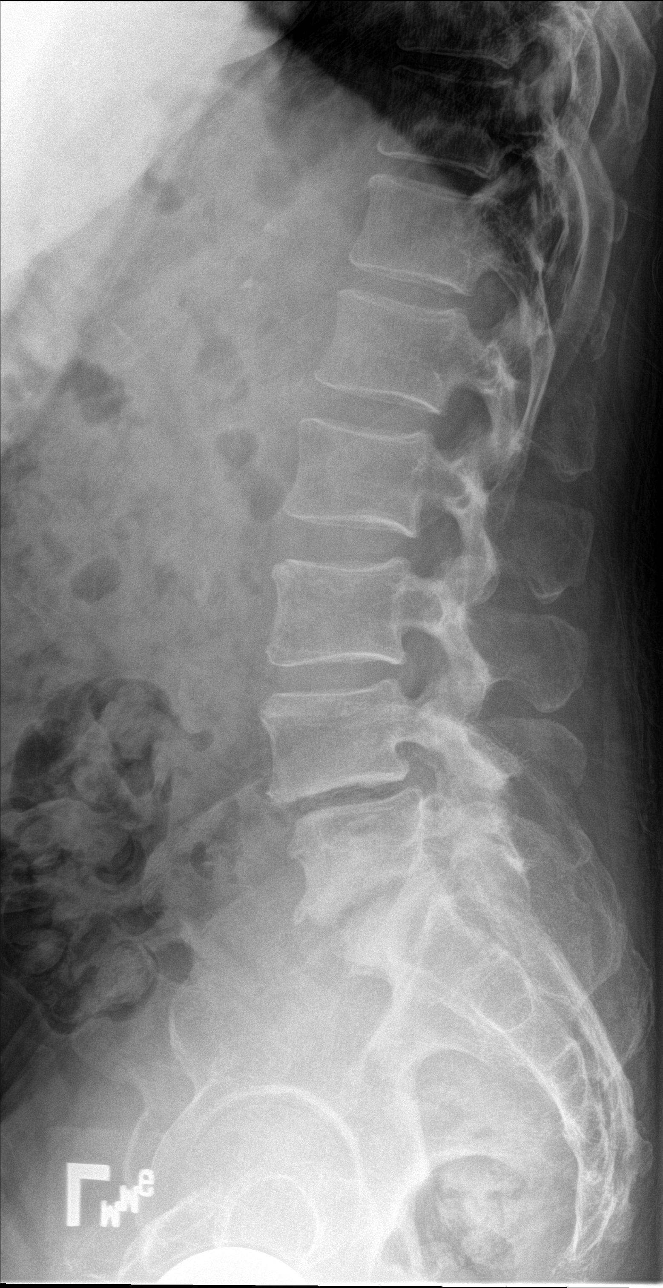

[l-spine spot]
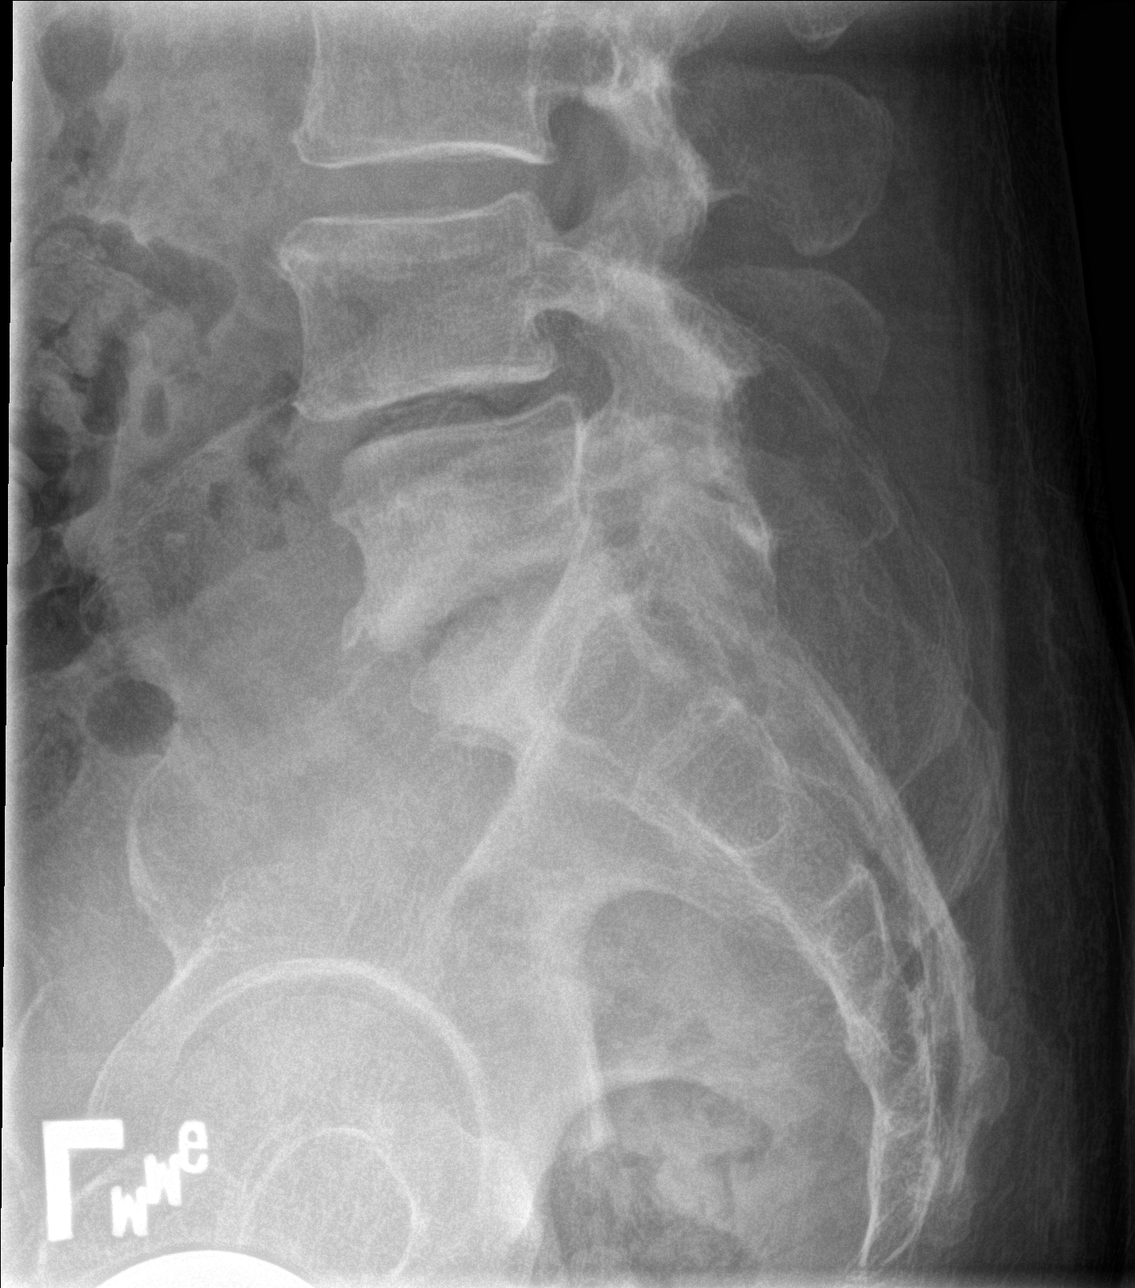

[5 of 5 positions shown; findings below may reference images not displayed]

FINDINGS: There is no evidence of lumbar spine fracture. Alignment is normal.
Degenerative changes at L4-5 and L5-S1 chronic, with disc space
narrowing, and slight facet mediated slip at L4-5. No worrisome
osseous lesions. New RIGHT hip replacement appears uncomplicated.
IMPRESSION: Degenerative disc disease.  No acute abnormality.

## 2016-05-26 ENCOUNTER — Encounter: Payer: Self-pay | Admitting: Family

## 2016-05-26 ENCOUNTER — Other Ambulatory Visit (INDEPENDENT_AMBULATORY_CARE_PROVIDER_SITE_OTHER): Payer: Medicare HMO

## 2016-05-26 ENCOUNTER — Ambulatory Visit (INDEPENDENT_AMBULATORY_CARE_PROVIDER_SITE_OTHER): Payer: Medicare HMO | Admitting: Family

## 2016-05-26 VITALS — BP 140/82 | HR 67 | Temp 98.8°F | Resp 18 | Ht 69.0 in | Wt 148.0 lb

## 2016-05-26 DIAGNOSIS — E1121 Type 2 diabetes mellitus with diabetic nephropathy: Secondary | ICD-10-CM

## 2016-05-26 DIAGNOSIS — IMO0002 Reserved for concepts with insufficient information to code with codable children: Secondary | ICD-10-CM

## 2016-05-26 DIAGNOSIS — M25551 Pain in right hip: Secondary | ICD-10-CM | POA: Diagnosis not present

## 2016-05-26 DIAGNOSIS — Z794 Long term (current) use of insulin: Secondary | ICD-10-CM | POA: Diagnosis not present

## 2016-05-26 DIAGNOSIS — E1165 Type 2 diabetes mellitus with hyperglycemia: Secondary | ICD-10-CM | POA: Diagnosis not present

## 2016-05-26 LAB — BASIC METABOLIC PANEL
BUN: 14 mg/dL (ref 6–23)
CO2: 27 mEq/L (ref 19–32)
Calcium: 9.5 mg/dL (ref 8.4–10.5)
Chloride: 104 mEq/L (ref 96–112)
Creatinine, Ser: 1.18 mg/dL (ref 0.40–1.50)
GFR: 78.2 mL/min (ref >60.00)
Glucose, Bld: 180 mg/dL — ABNORMAL HIGH (ref 70–99)
Potassium: 4.6 mEq/L (ref 3.5–5.1)
Sodium: 142 mEq/L (ref 135–145)

## 2016-05-26 LAB — GLUCOSE, POCT (MANUAL RESULT ENTRY): POC Glucose: 188 mg/dl — AB (ref 70–99)

## 2016-05-26 IMAGING — MR MR MRA HEAD W/O CM
1 series · 20 of 48 positions shown · non-contrast
Comparison: MRI head 03/20/2015

CLINICAL DATA: Stroke.  Right thalamic infarct.  Diabetes.

EXAM:
MRA HEAD WITHOUT CONTRAST
TECHNIQUE: Angiographic images of the Circle of Willis were obtained using MRA
technique without intravenous contrast.

[Series 4: ax (id) 2 · axial · 1.2mm · 0.43mm/px · z∈[-84,+30]mm · 20 of 200 slices shown]
[im 1/200]
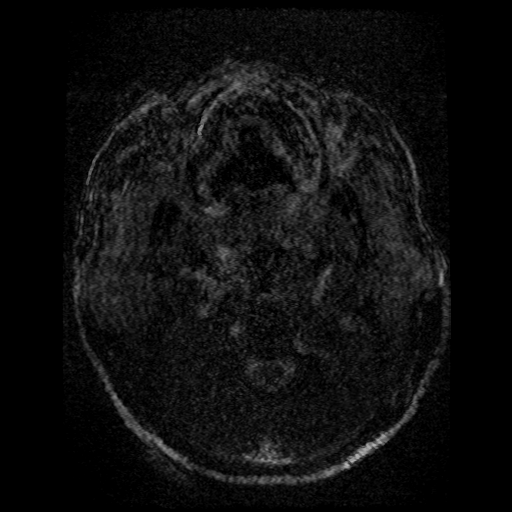
[im 5/200]
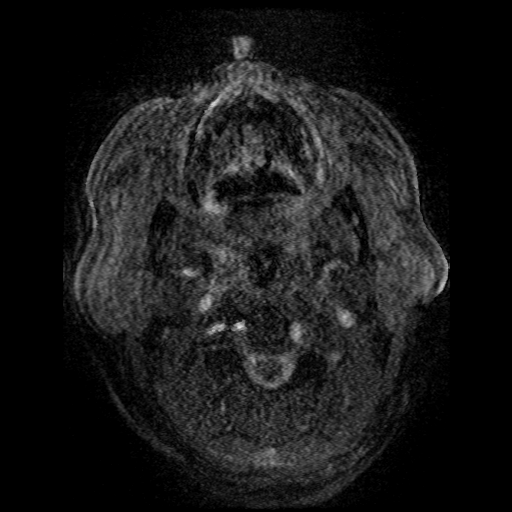
[im 9/200]
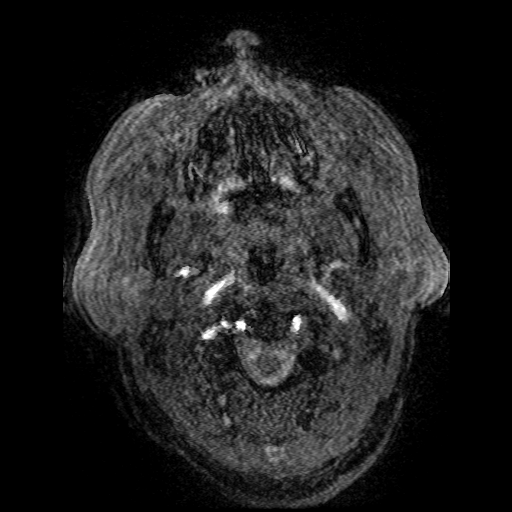
[im 13/200]
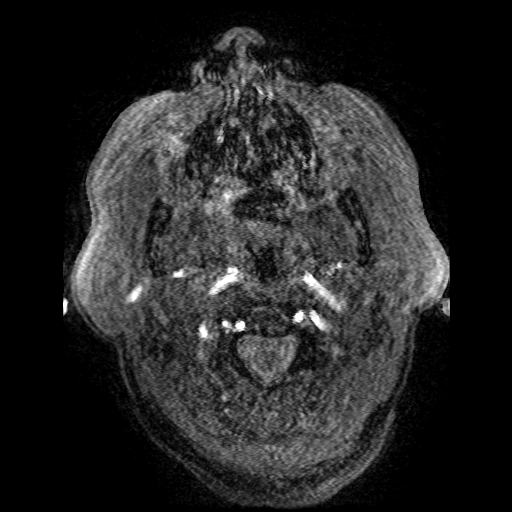
[im 17/200]
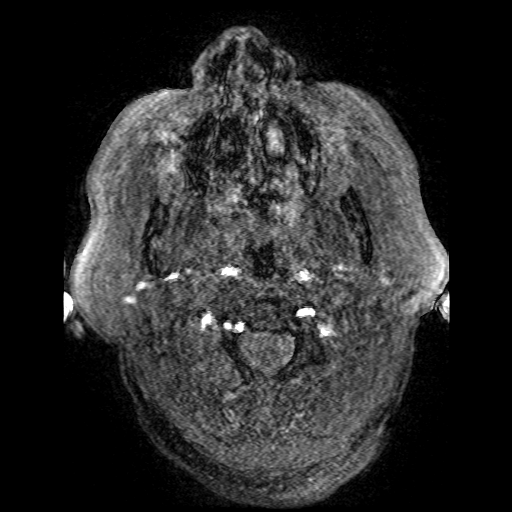
[im 22/200]
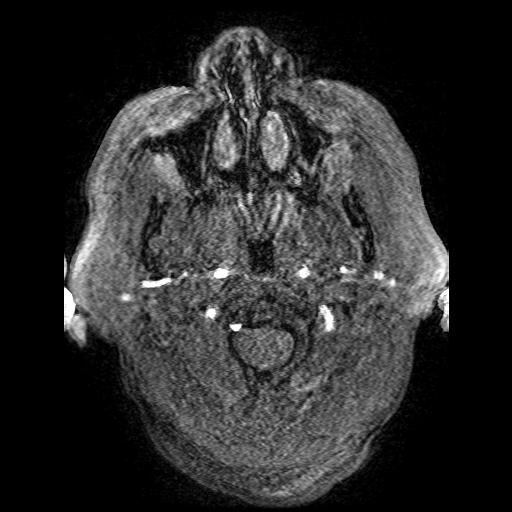
[im 26/200]
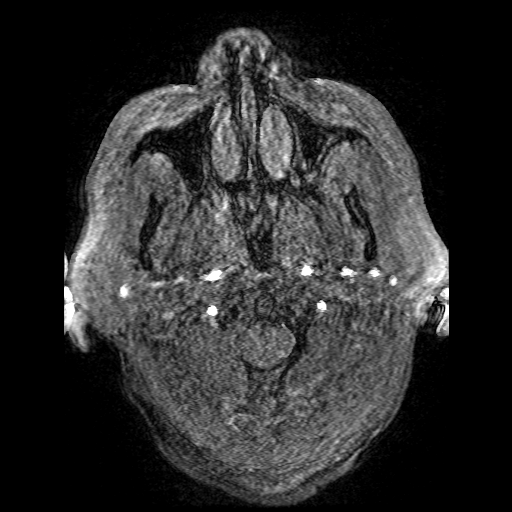
[im 30/200]
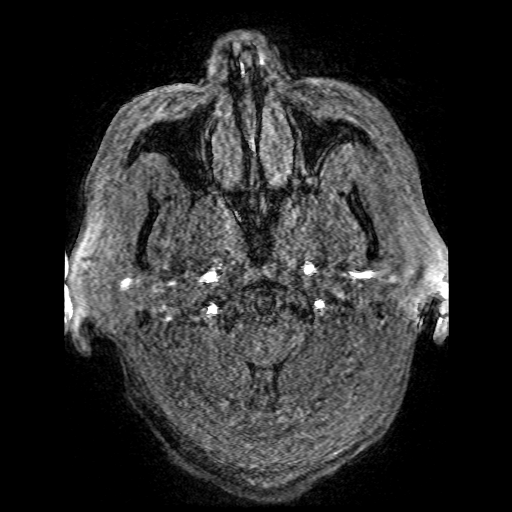
[im 34/200]
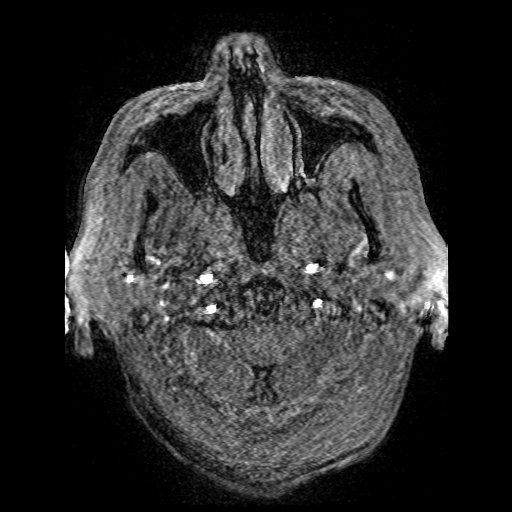
[im 39/200]
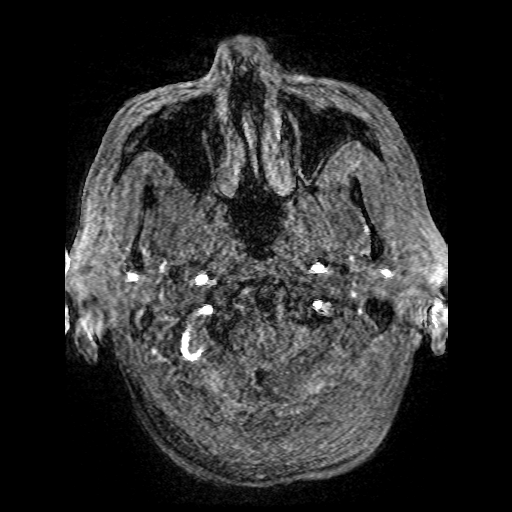
[im 43/200]
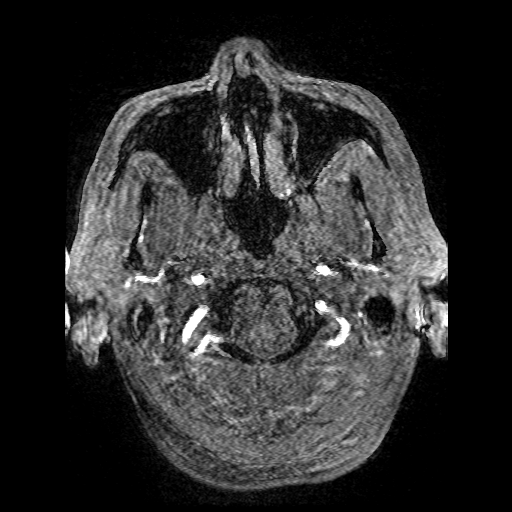
[im 47/200]
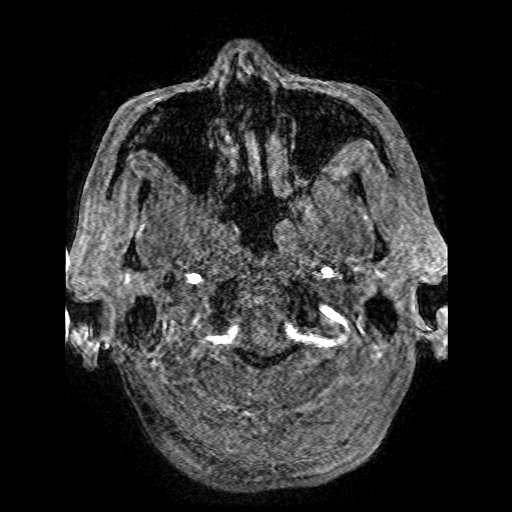
[im 64/200]
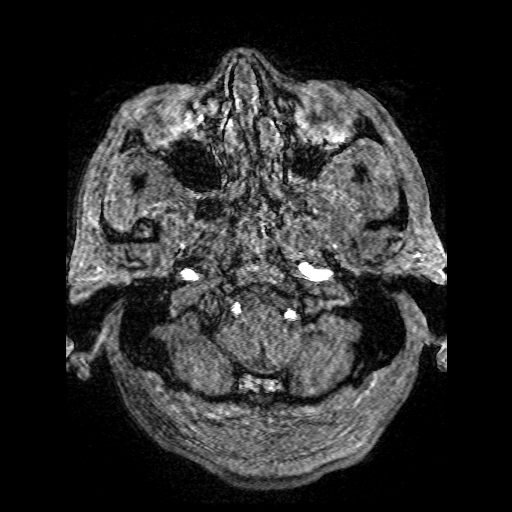
[im 89/200]
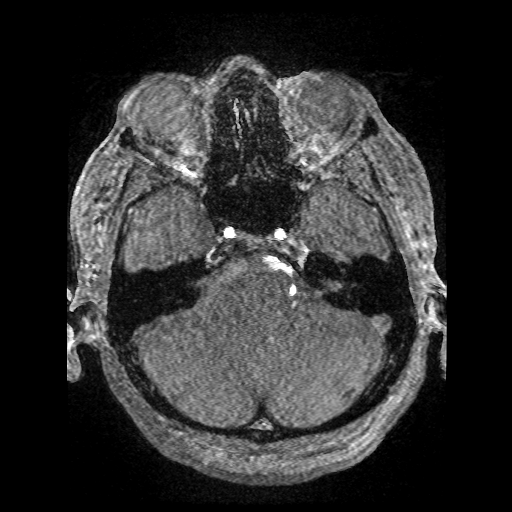
[im 102/200]
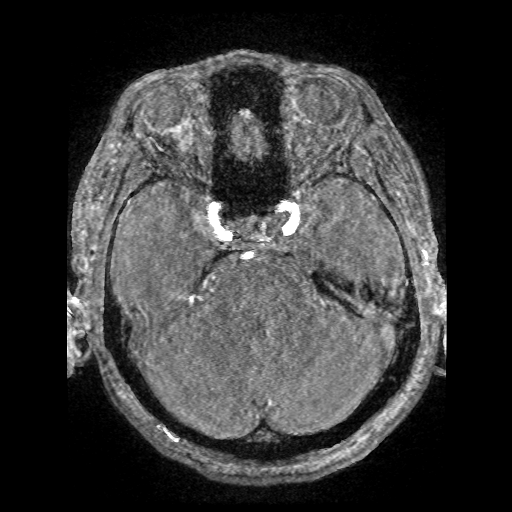
[im 115/200]
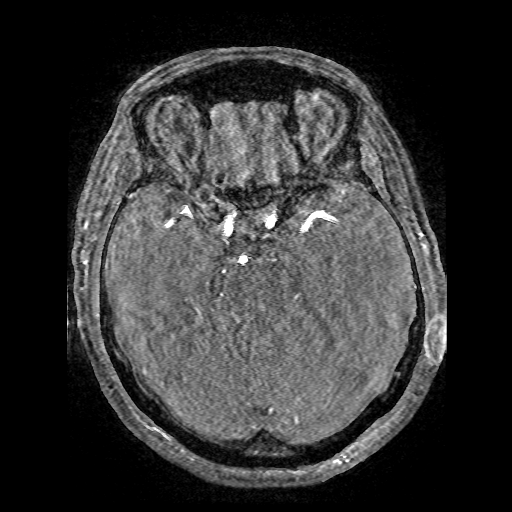
[im 140/200]
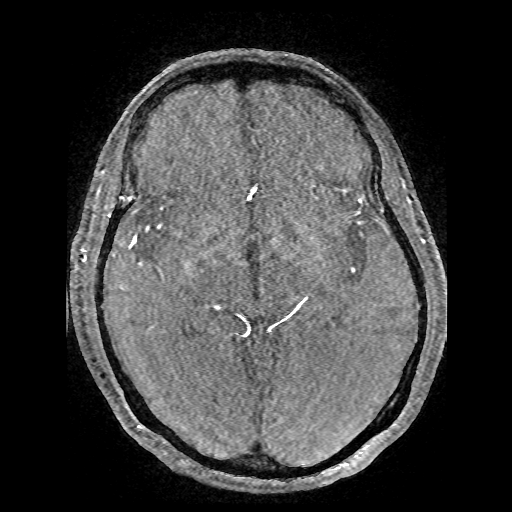
[im 166/200]
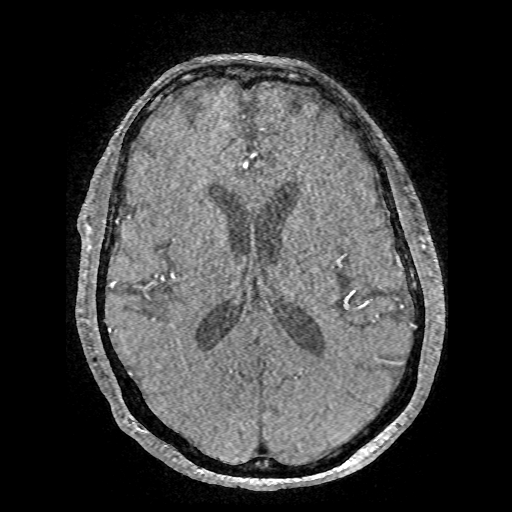
[im 170/200]
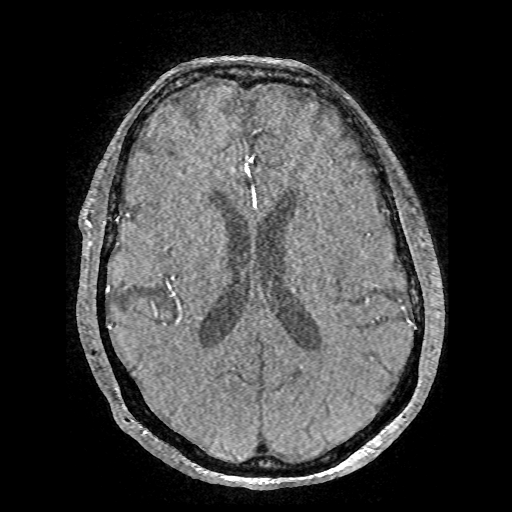
[im 191/200]
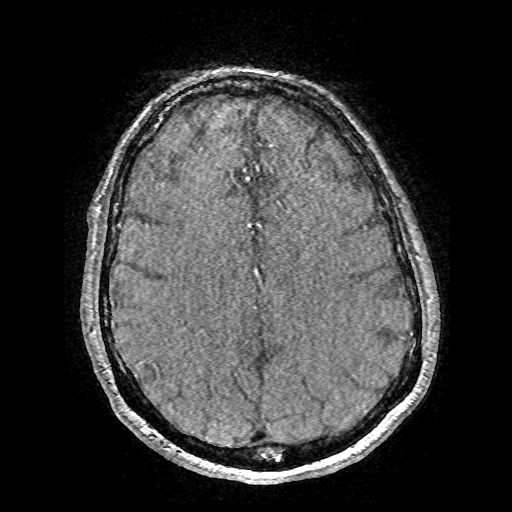

[20 of 48 positions shown; findings below may reference images not displayed]

FINDINGS: Both vertebral arteries patent to the basilar bilaterally. Mild
stenosis distal right vertebral artery. Basilar is tortuous but
patent. Superior cerebellar and posterior cerebral arteries patent
bilaterally without significant stenosis. PICA patent bilaterally

Internal carotid artery patent bilaterally without significant
stenosis. Tortuous intracranial circulation without significant
stenosis.

Negative for cerebral aneurysm.

Image quality degraded by patient motion
IMPRESSION: Motion degraded study. No significant intracranial stenosis or
occlusion.

## 2016-05-26 IMAGING — CR DG CHEST 1V PORT
1 series · 1 of 1 positions shown · non-contrast
Comparison: 7861 hr today and earlier.

CLINICAL DATA: 69-year-old male status post central line placement.
Initial encounter.

EXAM:
PORTABLE CHEST - 1 VIEW

[AP]
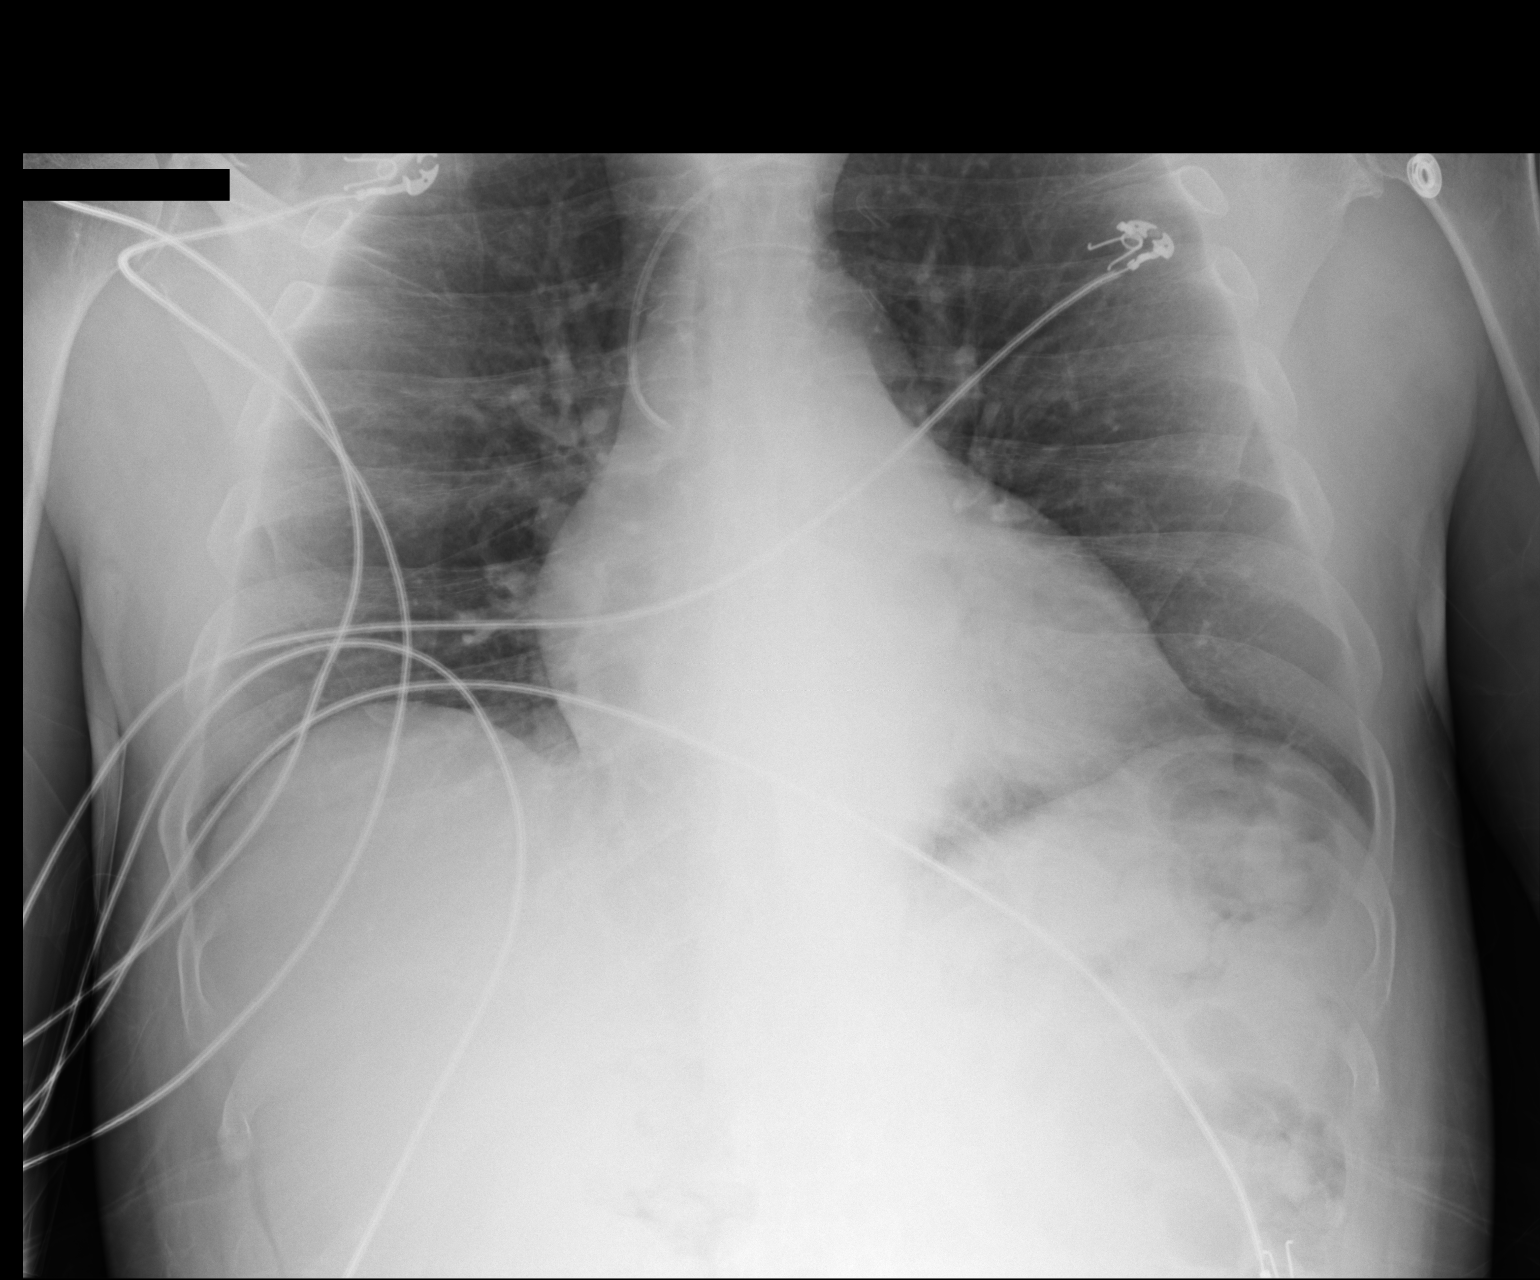

[1 of 1 positions shown; findings below may reference images not displayed]

FINDINGS: Portable AP semi upright view at 4024 hrs. New left IJ approach
central venous catheter. The tip projects just above the level of
the carina, however has a curved configuration distally. No
pneumothorax. Mediastinal contours remain normal. Stable
ventilation.
IMPRESSION: Left IJ approach central line placed with tip at the level of the
SVC. Curved configuration of the tip might indicate extension into
the proximal azygos vein. A lateral view of the chest should
confirm. Otherwise no adverse features.

## 2016-05-26 IMAGING — MR MR HEAD W/O CM
8 of 10 series · 34 of 48 positions shown · non-contrast
Comparison: Prior CT from 03/19/2015.

CLINICAL DATA: Initial evaluation for acute encephalopathy.

EXAM:
MRI HEAD WITHOUT CONTRAST
TECHNIQUE: Multiplanar, multiecho pulse sequences of the brain and surrounding
structures were obtained without intravenous contrast.

[Series 3: DWI · axial · 3.0mm · 1.09mm/px · z∈[-119,-1]mm · 8 of 84 slices shown (1 of 4)]
[im 1/84]
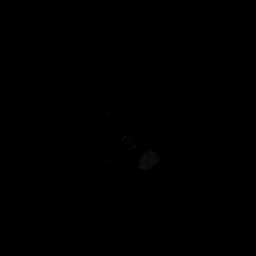
[im 12/84]
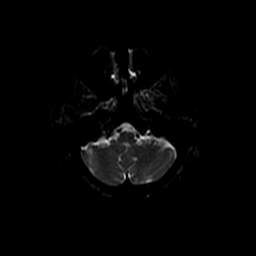
[im 24/84]
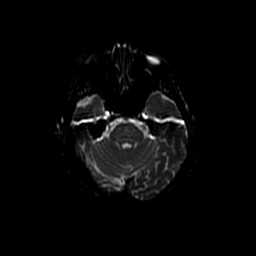
[im 36/84]
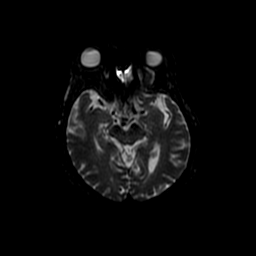
[im 48/84]
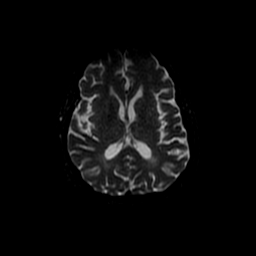
[im 60/84]
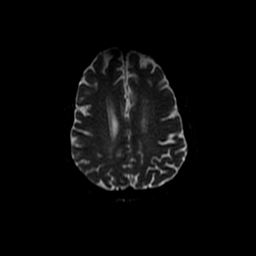
[im 72/84]
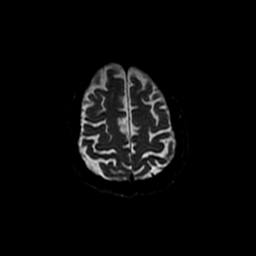
[im 84/84]
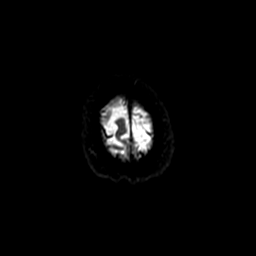

[Series 4: T1 · sagittal · 5.0mm · 0.47mm/px · 3 of 25 slices shown]
[im 1/25]
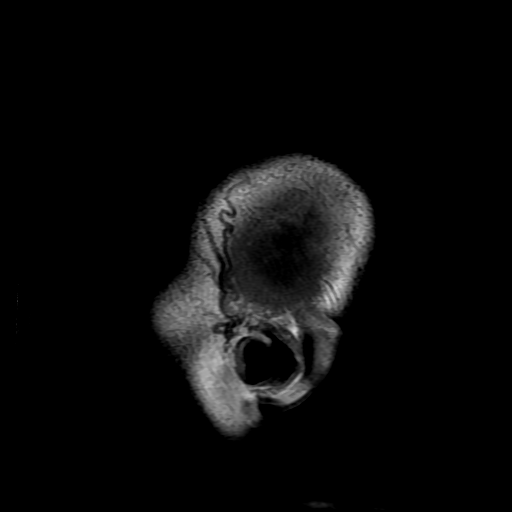
[im 13/25]
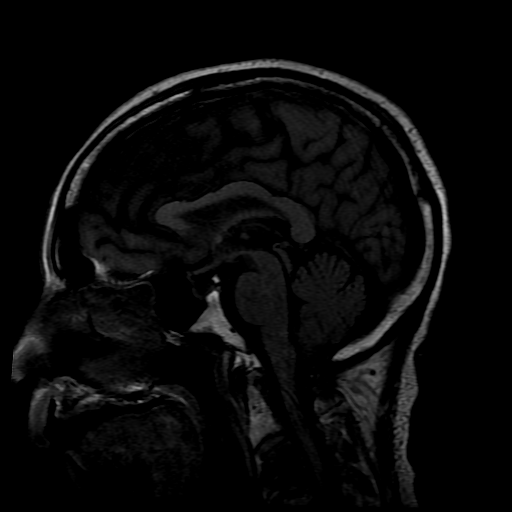
[im 25/25]
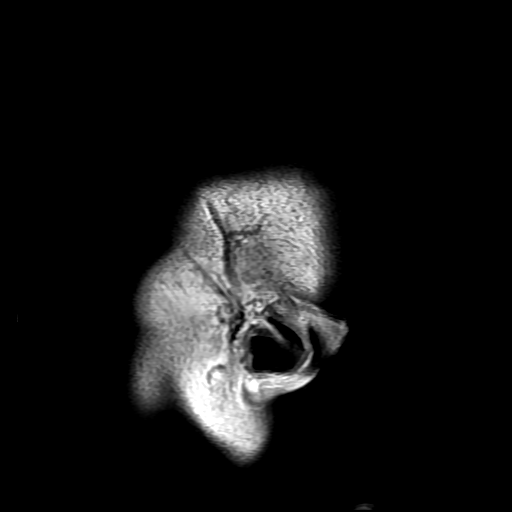

[Series 5: DWI · coronal · 5.0mm · 1.09mm/px · 7 of 66 slices shown (2 of 4)]
[im 1/66]
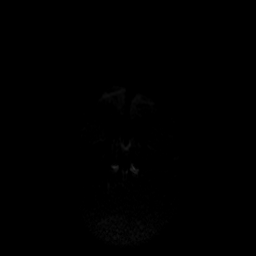
[im 11/66]
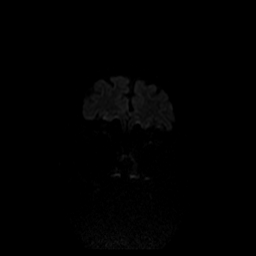
[im 22/66]
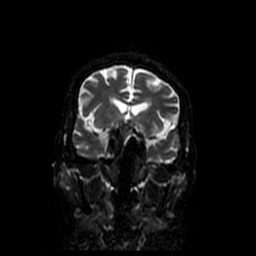
[im 33/66]
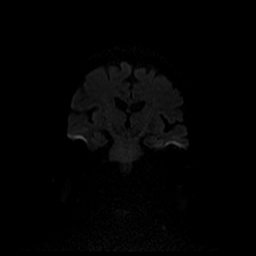
[im 44/66]
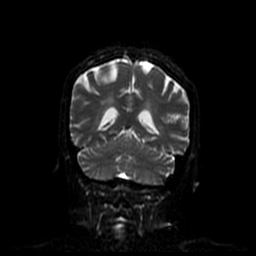
[im 55/66]
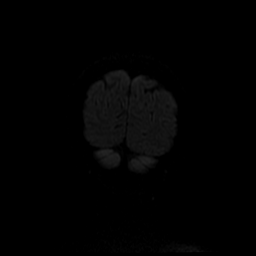
[im 66/66]
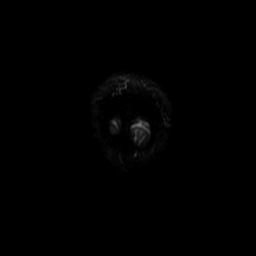

[Series 6: T2 · axial · 5.0mm · 0.43mm/px · z∈[-120,+18]mm · 3 of 25 slices shown (1 of 2)]
[im 1/25]
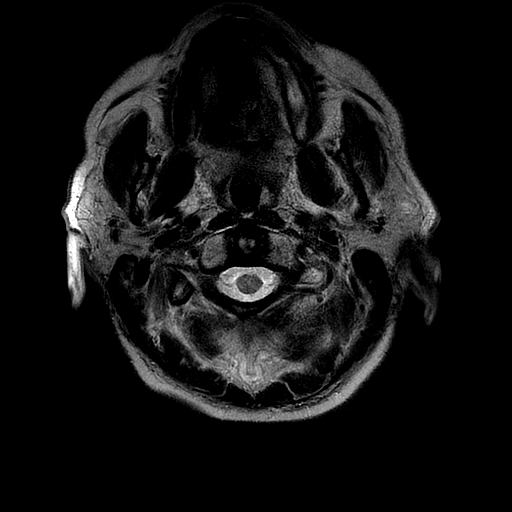
[im 13/25]
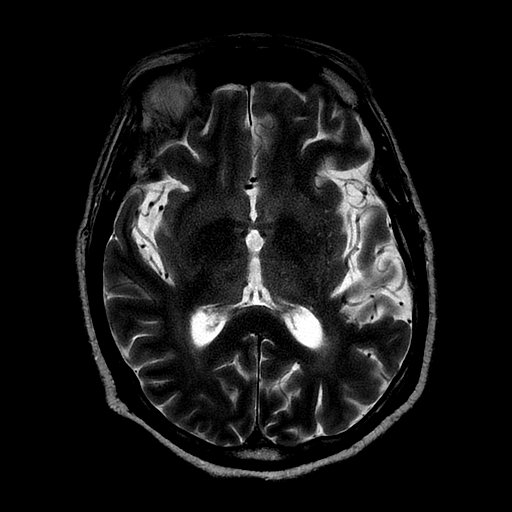
[im 25/25]
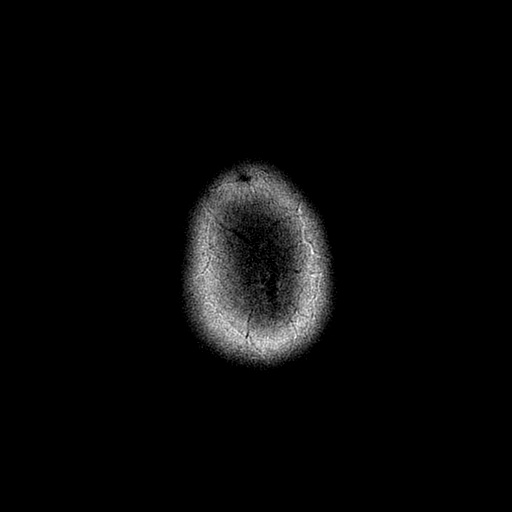

[Series 7: FLAIR · axial · 5.0mm · 0.43mm/px · z∈[-120,+18]mm · 3 of 25 slices shown]
[im 1/25]
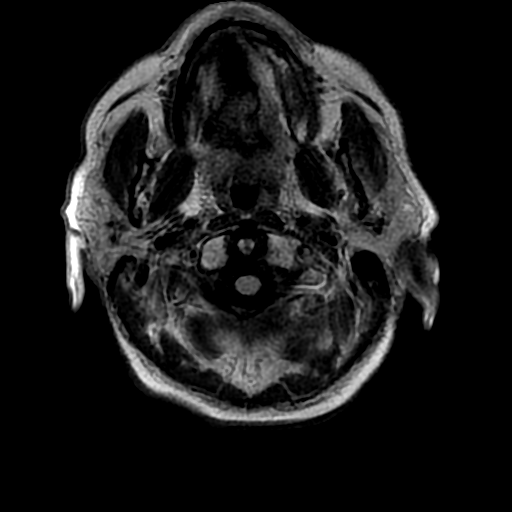
[im 13/25]
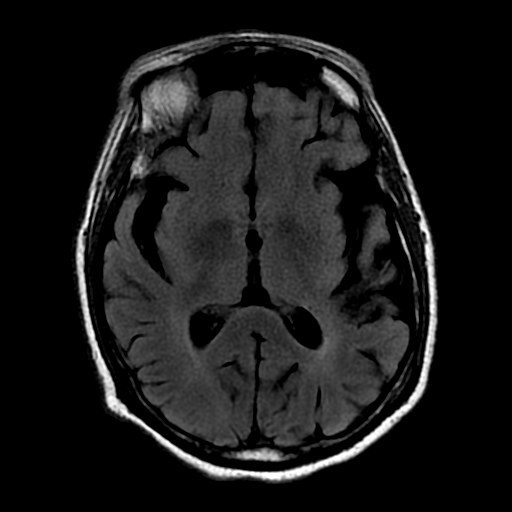
[im 25/25]
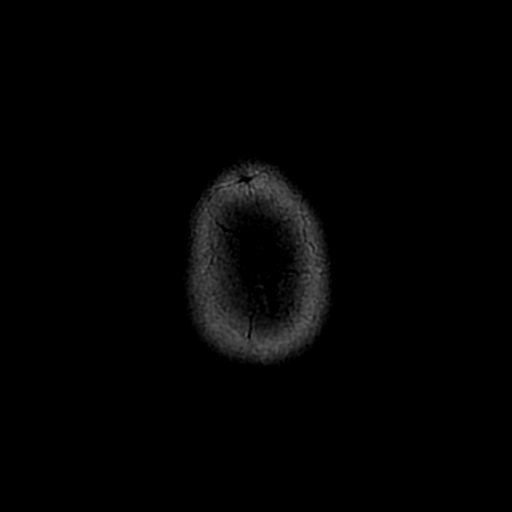

[Series 10: T2 · coronal · 5.0mm · 0.43mm/px · 3 of 30 slices shown (2 of 2)]
[im 1/30]
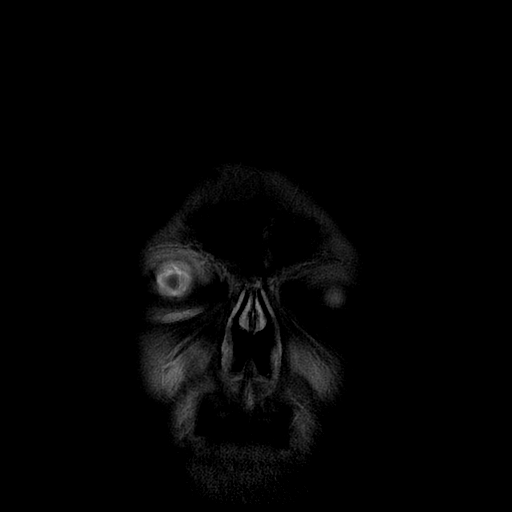
[im 15/30]
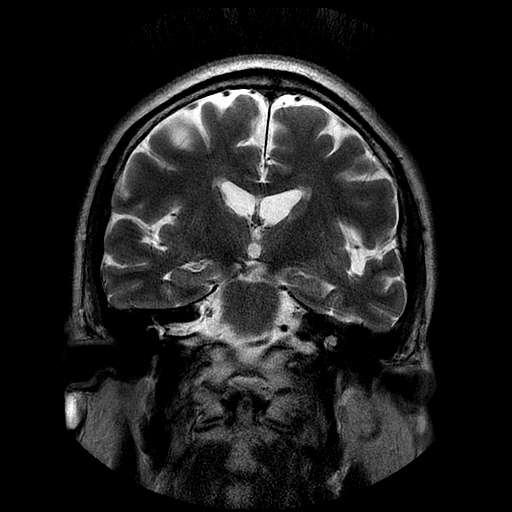
[im 30/30]
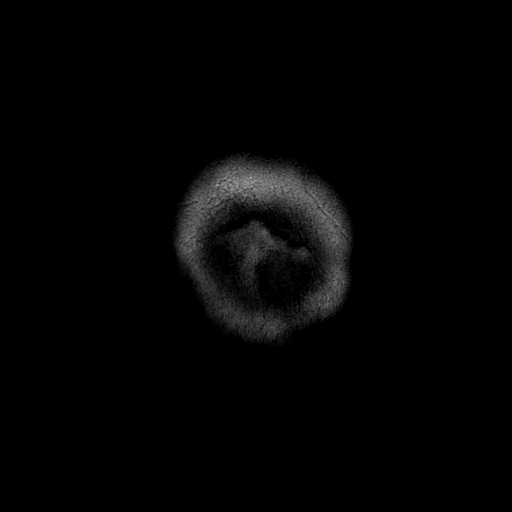

[Series 300: DWI · axial · 3.0mm · 1.09mm/px · z∈[-119,-1]mm · 4 of 42 slices shown (3 of 4)]
[im 1/42]
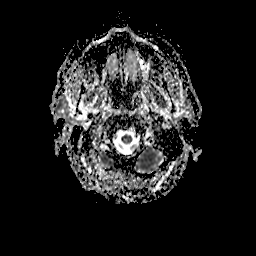
[im 14/42]
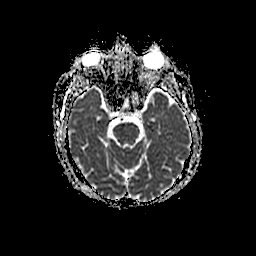
[im 28/42]
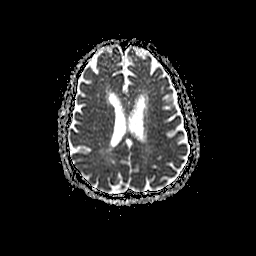
[im 42/42]
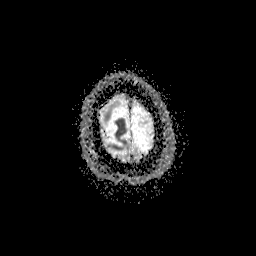

[Series 500: DWI · coronal · 5.0mm · 1.09mm/px · 3 of 33 slices shown (4 of 4)]
[im 1/33]
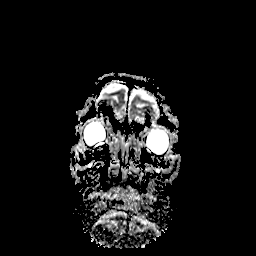
[im 17/33]
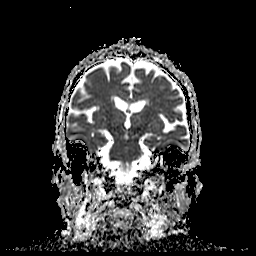
[im 33/33]
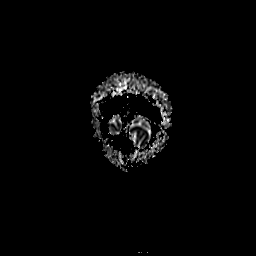

[34 of 48 positions shown; findings below may reference images not displayed]

FINDINGS: Diffuse prominence of the CSF containing spaces is compatible with
generalized age-related atrophy. Patchy and confluent T2/FLAIR
hyperintensity within the periventricular and deep white matter both
cerebral hemispheres most consistent with chronic small vessel
ischemic disease, fairly mild for patient age. Similar changes seen
within the pons.

There is a small 1 cm focus of restricted diffusion within the
lateral aspect of the right thalamus, consistent 4 an acute ischemic
infarct (series 3, image 23). Corresponding signal dropout seen on
ADC map. No significant mass effect. No associated hemorrhage. No
other infarct identified.

No mass lesion, mass effect, or midline shift. No hydrocephalus. No
extra-axial fluid collection.

Craniocervical junction within normal limits. Reversal of the normal
cervical lordosis with advanced degenerative disc disease present
within the partially visualized upper cervical spine. There is
associated mild to moderate canal stenosis.

Incidental note made of a partially empty sella.

No acute abnormality about the orbits.

Mild mucosal thickening present within the left maxillary sinus.
Small amount of layering opacity present within the right sphenoid
sinus. Paranasal sinuses are otherwise clear. No mastoid effusion.

Inner ear structures normal.

Bone marrow signal intensity within normal limits. No scalp soft
tissue abnormality.
IMPRESSION: 1. Subtle 1 cm acute ischemic infarct within the right thalamus. No
associated hemorrhage or mass effect.
2. No other acute intracranial process.
3. Atrophy with mild chronic small vessel ischemic disease.
4. Advanced degenerative disc disease within the partially
visualized upper cervical spine with associated canal stenosis.
Finding is incompletely evaluated on this exam. This could be
further characterized with dedicated MRI of the cervical spine as
clinically desired.

## 2016-05-26 NOTE — Patient Instructions (Addendum)
Thank you for choosing [facility].  Summary/Instructions:  Please continue to take your medications as prescribed:  Insulin: Novolog 70/30  - 24 units in the morning and 24 units in the evening.   Please check your blood sugars 2x daily and record them in a log and bring to next appointment in 1 month.  They will call to schedule your appointment with Dr. Gladstone Lighter.  Please stop by the lab on the lower level of the building for your blood work. Your results will be released to [facility] (or called to you) after review, usually within 72 hours after test completion. If any changes need to be made, you will be notified at that same time.  1. The lab is open from 7:30am to 5:30 pm Monday-Friday  2. No appointment is necessary  3. Fasting (if needed) is 6-8 hours after food and drink; black coffee and water  are okay   If your symptoms worsen or fail to improve, please contact our office for further instruction, or in case of emergency go directly to the emergency room at the closest medical facility.

## 2016-05-26 NOTE — Assessment & Plan Note (Signed)
Right hip pain status post right hip arthroplasty. Recommend conservative treatment with ice and OTC medications as needed. Refer to orthopedics for further evaluation.

## 2016-05-26 NOTE — Assessment & Plan Note (Signed)
Type 2 diabetes with multiple hospitalizations recently secondary to non-compliance with medication regimen. Was noted to have decreased blood sugars in the hospital with the change from Lantus/Novolog to Novolog 70/30. Discussed importance of taking medications as prescribed and nutrition to help manage his diabetes. Information written down in AVS. POCT blood sugar today 188. Continue current dosage of Novolog 70/30. Monitor and record blood sugars at home. Continue gabapentin for neuropathy. Maintained on ramipril and atorvastatin for CAD risk reduction. Foot and eye exam up to date. Obtain BMET to check kidney function. Follow up in 1 month to determine compliance with medication regimen.

## 2016-05-26 NOTE — Progress Notes (Signed)
Subjective:    Patient ID: [name] [name], male    DOB: 1946-04-21, 70 y.o.   MRN: [id number]  Chief Complaint  Patient presents with  . Hip Pain    having hip problems, pain in his hip, said sugar this morning was 242    HPI:  [name] [name] is a 70 y.o. male who  has a past medical history of Diabetes mellitus without complication ([place]) and Hypertension. and presents today for a hospital follow up.  1.) Diabetes -  Recently evaluated in the emergency department and admitted to the hospital following being found at home "slumped" in the corner of his bathroom having not been seen for 2 days. EMS indicated he was confused and hypoxemic at 80%. Blood sugar at the time was noted to be greater than is no evidence of alcohol or drug paraphernalia. He had recently been discharged from the hospital for hyperglycemia and DKA. EKG showed sinus rhythm. He was placed on the glucose stabilizing given 1 L bolus of fluid with a follow-up blood sugar checked before 144. Mental status was noted to be improving. He did have opiates in his urine and was negative for acetaminophen or elevation of liver enzymes. Chest x-ray was normal. Diagnosed with HHS secondary to suspected nonadherent with insulin regimen. A1c last month was 9.5. Had improved blood sugar control upon discharge. Serum creatinine was noted to be 2.71 on admission and improved with IV fluids and oral intake. Hospitalist recommended follow up BNP in 1-2 weeks. Hyponatremia was resolved. All hospital records, imaging and labs were reviewed in detail.  Type 2 Diabetes currently maintained on Novolog 70/30 and Novolog with meals. Reports taking his insulin as prescribed with his blood sugar this morning being 242. Reports that he is taking 20 units of the Novolog 70/30. Has not yet taken his insulin this morning. Denies any numbness with some tingling on the bottom of his feet. No changes in vision and continues to work with Dr. Zigmund Daniel of opthalmology.  Reports he is eating okay and is not watching carbohydrate intake.  2.) Hip pain - Associated symptom of pain located in his right hip has been going on for about 2-3 weeks which he indicates he had a hip arthroplasty done. He reports that his hip has been coming out of place and does have difficulty laying on it.    No Known Allergies   Current Outpatient Prescriptions on File Prior to Visit  Medication Sig Dispense Refill  . acetaminophen (TYLENOL) 325 MG tablet Take 2 tablets (650 mg total) by mouth every 6 (six) hours as needed for mild pain (or Fever >/= 101). 30 tablet 0  . alprazolam (XANAX) 2 MG tablet Take 0.5 tablets (1 mg total) by mouth 2 (two) times daily as needed for anxiety. 10 tablet 0  . amLODipine (NORVASC) 10 MG tablet Take 1 tablet (10 mg total) by mouth daily. 30 tablet 0  . aspirin EC 325 MG tablet Take 1 tablet (325 mg total) by mouth daily. 90 tablet 3  . atorvastatin (LIPITOR) 10 MG tablet Take 1 tablet (10 mg total) by mouth daily at 6 PM. (Patient taking differently: Take 10 mg by mouth daily. ) 30 tablet 0  . blood glucose meter kit and supplies Use daily to check blood sugar. DX: E11.09 1 each 0  . carvedilol (COREG) 6.25 MG tablet Take 1 tablet (6.25 mg total) by mouth 2 (two) times daily with a meal. 60 tablet 0  . clopidogrel (PLAVIX) 75  MG tablet Take 75 mg by mouth daily.    . feeding supplement, GLUCERNA SHAKE, (GLUCERNA SHAKE) LIQD Take 237 mLs by mouth 2 (two) times daily between meals. 237 mL 0  . gabapentin (NEURONTIN) 300 MG capsule Take 1 tablet by mouth daily for 1 day then 1 tablet by mouth twice daily for 1 day and then 1 tablet by mouth 3 times daily. 90 capsule 1  . glucose blood (COOL BLOOD GLUCOSE TEST STRIPS) test strip Use as instructed to check blood sugar up to 3 times a day. DX: E11.09 300 each 1  . insulin aspart protamine - aspart (NOVOLOG 70/30 MIX) (70-30) 100 UNIT/ML FlexPen Inject 0.24 mLs (24 Units total) into the skin 2 (two) times  daily. 18 mL 1  . Insulin Pen Needle 31G X 5 MM MISC 1 each by Does not apply route 3 (three) times daily. 100 each 5  . Lancets MISC Use as instructed to check blood sugar up to 3 times a day. DX: E11.09 300 each 1  . ramipril (ALTACE) 10 MG capsule Take 10 mg by mouth daily.     Marland Kitchen thiamine 100 MG tablet Take 1 tablet (100 mg total) by mouth daily. 30 tablet 0   No current facility-administered medications on file prior to visit.      Past Surgical History:  Procedure Laterality Date  . HIP ARTHROPLASTY Right 02/05/2013   Procedure: ARTHROPLASTY BIPOLAR HIP;  Surgeon: [name], MD;  Location: [facility];  Service: Orthopedics;  Laterality: Right;    Past Medical History:  Diagnosis Date  . Diabetes mellitus without complication ([place])   . Hypertension      Review of Systems  Eyes:       Negative for changes in vision.  Respiratory: Negative for chest tightness and shortness of breath.   Cardiovascular: Negative for chest pain, palpitations and leg swelling.  Endocrine: Negative for polydipsia, polyphagia and polyuria.  Neurological: Negative for dizziness, weakness, light-headedness and headaches.      Objective:    BP 140/82 (BP Location: Left Arm, Patient Position: Sitting, Cuff Size: Normal)   Pulse 67   Temp 98.8 F (37.1 C) (Oral)   Resp 18   Ht '5\' 9"'  (1.753 m)   Wt 148 lb (67.1 kg)   SpO2 96%   BMI 21.86 kg/m  Nursing note and vital signs reviewed.  Physical Exam  Constitutional: He is oriented to person, place, and time. He appears well-developed and well-nourished. No distress.  Cardiovascular: Normal rate, regular rhythm, normal heart sounds and intact distal pulses.   Pulmonary/Chest: Effort normal and breath sounds normal.  Musculoskeletal:  Right hip - No obvious deformity, discoloration or edema. There is tenderness over the greater trochanter and areas superior and inferior along iliotibial band. Range of motion and strength are normal. Distal  pulses are intact and appropriate.   Neurological: He is alert and oriented to person, place, and time.  Skin: Skin is warm and dry.  Psychiatric: He has a normal mood and affect. His behavior is normal. Judgment and thought content normal.       Assessment & Plan:   Problem List Items Addressed This Visit      Endocrine   Uncontrolled type 2 diabetes mellitus with diabetic nephropathy, with long-term current use of insulin ([name]) - Primary (Chronic)    Type 2 diabetes with multiple hospitalizations recently secondary to non-compliance with medication regimen. Was noted to have decreased blood sugars in the hospital with the  change from Lantus/Novolog to Novolog 70/30. Discussed importance of taking medications as prescribed and nutrition to help manage his diabetes. Information written down in AVS. POCT blood sugar today 188. Continue current dosage of Novolog 70/30. Monitor and record blood sugars at home. Continue gabapentin for neuropathy. Maintained on ramipril and atorvastatin for CAD risk reduction. Foot and eye exam up to date. Obtain BMET to check kidney function. Follow up in 1 month to determine compliance with medication regimen.       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 Recommend conservative treatment with ice and OTC medications as needed. Refer to orthopedics for further evaluation.       Relevant Orders   Ambulatory referral to Orthopedic Surgery    Other Visit Diagnoses   None.      I have discontinued Mr. Bonenberger insulin aspart. I am also having him maintain his atorvastatin, aspirin EC, acetaminophen, feeding supplement (GLUCERNA SHAKE), amLODipine, alprazolam, clopidogrel, Lancets, thiamine, gabapentin, Insulin Pen Needle, blood glucose meter kit and supplies, glucose blood, carvedilol, ramipril, and insulin aspart  protamine - aspart.   Follow-up: Return in about 1 month (around 06/26/2016), or if symptoms worsen or fail to improve.  Mauricio Po, [name]

## 2016-06-15 ENCOUNTER — Emergency Department (HOSPITAL_COMMUNITY): Payer: Medicare HMO

## 2016-06-15 ENCOUNTER — Encounter (HOSPITAL_COMMUNITY): Payer: Self-pay | Admitting: Emergency Medicine

## 2016-06-15 ENCOUNTER — Inpatient Hospital Stay (HOSPITAL_COMMUNITY): Payer: Medicare HMO

## 2016-06-15 ENCOUNTER — Inpatient Hospital Stay (HOSPITAL_COMMUNITY)
Admission: EM | Admit: 2016-06-15 | Discharge: 2016-06-21 | DRG: 480 | Disposition: A | Discharge status: Skilled Nursing Facility | Payer: Medicare HMO | Attending: Internal Medicine | Admitting: Internal Medicine

## 2016-06-15 DIAGNOSIS — Z833 Family history of diabetes mellitus: Secondary | ICD-10-CM | POA: Diagnosis not present

## 2016-06-15 DIAGNOSIS — E538 Deficiency of other specified B group vitamins: Secondary | ICD-10-CM | POA: Diagnosis present

## 2016-06-15 DIAGNOSIS — IMO0002 Reserved for concepts with insufficient information to code with codable children: Secondary | ICD-10-CM

## 2016-06-15 DIAGNOSIS — I10 Essential (primary) hypertension: Secondary | ICD-10-CM | POA: Diagnosis not present

## 2016-06-15 DIAGNOSIS — I129 Hypertensive chronic kidney disease with stage 1 through stage 4 chronic kidney disease, or unspecified chronic kidney disease: Secondary | ICD-10-CM | POA: Diagnosis present

## 2016-06-15 DIAGNOSIS — E1165 Type 2 diabetes mellitus with hyperglycemia: Secondary | ICD-10-CM

## 2016-06-15 DIAGNOSIS — I9581 Postprocedural hypotension: Secondary | ICD-10-CM | POA: Diagnosis not present

## 2016-06-15 DIAGNOSIS — D638 Anemia in other chronic diseases classified elsewhere: Secondary | ICD-10-CM | POA: Diagnosis present

## 2016-06-15 DIAGNOSIS — Z419 Encounter for procedure for purposes other than remedying health state, unspecified: Secondary | ICD-10-CM | POA: Diagnosis not present

## 2016-06-15 DIAGNOSIS — D62 Acute posthemorrhagic anemia: Secondary | ICD-10-CM

## 2016-06-15 DIAGNOSIS — N289 Disorder of kidney and ureter, unspecified: Secondary | ICD-10-CM | POA: Diagnosis present

## 2016-06-15 DIAGNOSIS — G8929 Other chronic pain: Secondary | ICD-10-CM | POA: Diagnosis present

## 2016-06-15 DIAGNOSIS — M549 Dorsalgia, unspecified: Secondary | ICD-10-CM | POA: Diagnosis present

## 2016-06-15 DIAGNOSIS — Z96641 Presence of right artificial hip joint: Secondary | ICD-10-CM | POA: Diagnosis present

## 2016-06-15 DIAGNOSIS — S72142A Displaced intertrochanteric fracture of left femur, initial encounter for closed fracture: Secondary | ICD-10-CM | POA: Diagnosis not present

## 2016-06-15 DIAGNOSIS — Z7982 Long term (current) use of aspirin: Secondary | ICD-10-CM

## 2016-06-15 DIAGNOSIS — Z8673 Personal history of transient ischemic attack (TIA), and cerebral infarction without residual deficits: Secondary | ICD-10-CM | POA: Diagnosis not present

## 2016-06-15 DIAGNOSIS — F419 Anxiety disorder, unspecified: Secondary | ICD-10-CM | POA: Diagnosis present

## 2016-06-15 DIAGNOSIS — Z794 Long term (current) use of insulin: Secondary | ICD-10-CM

## 2016-06-15 DIAGNOSIS — R58 Hemorrhage, not elsewhere classified: Secondary | ICD-10-CM

## 2016-06-15 DIAGNOSIS — E11649 Type 2 diabetes mellitus with hypoglycemia without coma: Secondary | ICD-10-CM | POA: Diagnosis not present

## 2016-06-15 DIAGNOSIS — S72002A Fracture of unspecified part of neck of left femur, initial encounter for closed fracture: Secondary | ICD-10-CM | POA: Diagnosis not present

## 2016-06-15 DIAGNOSIS — W010XXA Fall on same level from slipping, tripping and stumbling without subsequent striking against object, initial encounter: Secondary | ICD-10-CM | POA: Diagnosis present

## 2016-06-15 DIAGNOSIS — K683 Retroperitoneal hematoma: Secondary | ICD-10-CM

## 2016-06-15 DIAGNOSIS — F329 Major depressive disorder, single episode, unspecified: Secondary | ICD-10-CM | POA: Diagnosis present

## 2016-06-15 DIAGNOSIS — Z87891 Personal history of nicotine dependence: Secondary | ICD-10-CM

## 2016-06-15 DIAGNOSIS — N183 Chronic kidney disease, stage 3 unspecified: Secondary | ICD-10-CM | POA: Diagnosis present

## 2016-06-15 DIAGNOSIS — Z79899 Other long term (current) drug therapy: Secondary | ICD-10-CM

## 2016-06-15 DIAGNOSIS — Z01818 Encounter for other preprocedural examination: Secondary | ICD-10-CM

## 2016-06-15 DIAGNOSIS — E1122 Type 2 diabetes mellitus with diabetic chronic kidney disease: Secondary | ICD-10-CM | POA: Diagnosis present

## 2016-06-15 DIAGNOSIS — Z7902 Long term (current) use of antithrombotics/antiplatelets: Secondary | ICD-10-CM | POA: Diagnosis not present

## 2016-06-15 DIAGNOSIS — N179 Acute kidney failure, unspecified: Secondary | ICD-10-CM | POA: Diagnosis present

## 2016-06-15 DIAGNOSIS — Z09 Encounter for follow-up examination after completed treatment for conditions other than malignant neoplasm: Secondary | ICD-10-CM

## 2016-06-15 DIAGNOSIS — E1169 Type 2 diabetes mellitus with other specified complication: Secondary | ICD-10-CM | POA: Diagnosis present

## 2016-06-15 DIAGNOSIS — E785 Hyperlipidemia, unspecified: Secondary | ICD-10-CM | POA: Diagnosis present

## 2016-06-15 DIAGNOSIS — N189 Chronic kidney disease, unspecified: Secondary | ICD-10-CM

## 2016-06-15 DIAGNOSIS — Z131 Encounter for screening for diabetes mellitus: Secondary | ICD-10-CM | POA: Diagnosis not present

## 2016-06-15 DIAGNOSIS — K661 Hemoperitoneum: Secondary | ICD-10-CM | POA: Diagnosis not present

## 2016-06-15 DIAGNOSIS — Z8249 Family history of ischemic heart disease and other diseases of the circulatory system: Secondary | ICD-10-CM

## 2016-06-15 DIAGNOSIS — S7222XA Displaced subtrochanteric fracture of left femur, initial encounter for closed fracture: Principal | ICD-10-CM | POA: Diagnosis present

## 2016-06-15 DIAGNOSIS — E1121 Type 2 diabetes mellitus with diabetic nephropathy: Secondary | ICD-10-CM

## 2016-06-15 HISTORY — DX: Type 2 diabetes mellitus with ketoacidosis without coma: E11.10

## 2016-06-15 HISTORY — DX: Cerebral infarction due to thrombosis of right posterior cerebral artery: I63.331

## 2016-06-15 HISTORY — DX: Hemoperitoneum: K66.1

## 2016-06-15 HISTORY — DX: Deficiency of other specified B group vitamins: E53.8

## 2016-06-15 HISTORY — DX: Type 2 diabetes mellitus with hyperosmolarity without nonketotic hyperglycemic-hyperosmolar coma (NKHHC): E11.00

## 2016-06-15 HISTORY — DX: Acute posthemorrhagic anemia: D62

## 2016-06-15 HISTORY — DX: Displaced intertrochanteric fracture of left femur, initial encounter for closed fracture: S72.142A

## 2016-06-15 LAB — CBC WITH DIFFERENTIAL/PLATELET
Basophils Absolute: 0 10*3/uL (ref 0.0–0.1)
Basophils Relative: 0 %
Eosinophils Absolute: 0.1 10*3/uL (ref 0.0–0.7)
Eosinophils Relative: 1 %
HCT: 27.1 % — ABNORMAL LOW (ref 39.0–52.0)
Hemoglobin: 8.9 g/dL — ABNORMAL LOW (ref 13.0–17.0)
Lymphocytes Relative: 13 %
Lymphs Abs: 1 10*3/uL (ref 0.7–4.0)
MCH: 29.3 pg (ref 26.0–34.0)
MCHC: 32.8 g/dL (ref 30.0–36.0)
MCV: 89.1 fL (ref 78.0–100.0)
Monocytes Absolute: 1 10*3/uL (ref 0.1–1.0)
Monocytes Relative: 12 %
Neutro Abs: 6 10*3/uL (ref 1.7–7.7)
Neutrophils Relative %: 74 %
Platelets: 186 10*3/uL (ref 150–400)
RBC: 3.04 MIL/uL — ABNORMAL LOW (ref 4.22–5.81)
RDW: 14.1 % (ref 11.5–15.5)
WBC: 8.1 10*3/uL (ref 4.0–10.5)

## 2016-06-15 LAB — COMPREHENSIVE METABOLIC PANEL
ALT: 13 U/L — ABNORMAL LOW (ref 17–63)
AST: 19 U/L (ref 15–41)
Albumin: 3.8 g/dL (ref 3.5–5.0)
Alkaline Phosphatase: 70 U/L (ref 38–126)
Anion gap: 9 (ref 5–15)
BUN: 30 mg/dL — ABNORMAL HIGH (ref 6–20)
CO2: 22 mmol/L (ref 22–32)
Calcium: 8.6 mg/dL — ABNORMAL LOW (ref 8.9–10.3)
Chloride: 104 mmol/L (ref 101–111)
Creatinine, Ser: 2.09 mg/dL — ABNORMAL HIGH (ref 0.61–1.24)
GFR calc Af Amer: 35 mL/min — ABNORMAL LOW (ref >60)
GFR calc non Af Amer: 30 mL/min — ABNORMAL LOW (ref >60)
Glucose, Bld: 492 mg/dL — ABNORMAL HIGH (ref 65–99)
Potassium: 4.6 mmol/L (ref 3.5–5.1)
Sodium: 135 mmol/L (ref 135–145)
Total Bilirubin: 1 mg/dL (ref 0.3–1.2)
Total Protein: 6.9 g/dL (ref 6.5–8.1)

## 2016-06-15 LAB — CBG MONITORING, ED
Glucose-Capillary: 467 mg/dL — ABNORMAL HIGH (ref 65–99)
Glucose-Capillary: 201 mg/dL — ABNORMAL HIGH (ref 65–99)

## 2016-06-15 LAB — GLUCOSE, CAPILLARY
Glucose-Capillary: 337 mg/dL — ABNORMAL HIGH (ref 65–99)
Glucose-Capillary: 219 mg/dL — ABNORMAL HIGH (ref 65–99)
Glucose-Capillary: 231 mg/dL — ABNORMAL HIGH (ref 65–99)

## 2016-06-15 LAB — URINALYSIS, ROUTINE W REFLEX MICROSCOPIC
Bilirubin Urine: NEGATIVE
Glucose, UA: >1000 mg/dL — AB
Ketones, ur: NEGATIVE mg/dL
Leukocytes, UA: NEGATIVE
Nitrite: NEGATIVE
Protein, ur: NEGATIVE mg/dL
Specific Gravity, Urine: 1.018 (ref 1.005–1.030)
pH: 5.5 (ref 5.0–8.0)

## 2016-06-15 LAB — I-STAT CHEM 8, ED
BUN: 34 mg/dL — ABNORMAL HIGH (ref 6–20)
Calcium, Ion: 1.12 mmol/L — ABNORMAL LOW (ref 1.15–1.40)
Chloride: 102 mmol/L (ref 101–111)
Creatinine, Ser: 2 mg/dL — ABNORMAL HIGH (ref 0.61–1.24)
Glucose, Bld: 477 mg/dL — ABNORMAL HIGH (ref 65–99)
HCT: 30 % — ABNORMAL LOW (ref 39.0–52.0)
Hemoglobin: 10.2 g/dL — ABNORMAL LOW (ref 13.0–17.0)
Potassium: 5 mmol/L (ref 3.5–5.1)
Sodium: 136 mmol/L (ref 135–145)
TCO2: 24 mmol/L (ref 0–100)

## 2016-06-15 LAB — PROTIME-INR
INR: 0.97
Prothrombin Time: 12.9 seconds (ref 11.4–15.2)

## 2016-06-15 LAB — URINE MICROSCOPIC-ADD ON
Bacteria, UA: NONE SEEN
RBC / HPF: NONE SEEN RBC/hpf (ref 0–5)
Squamous Epithelial / LPF: NONE SEEN

## 2016-06-15 LAB — SURGICAL PCR SCREEN
MRSA, PCR: NEGATIVE
Staphylococcus aureus: POSITIVE — AB

## 2016-06-15 MED ORDER — MORPHINE SULFATE (PF) 2 MG/ML IV SOLN
1.0000 mg | INTRAVENOUS | Status: DC | PRN
  Administered 2016-06-15 (×3): 1 mg via INTRAVENOUS
  Filled 2016-06-15 (×4): qty 1

## 2016-06-15 MED ORDER — INSULIN DETEMIR 100 UNIT/ML ~~LOC~~ SOLN
10.0000 [IU] | Freq: Every day | SUBCUTANEOUS | Status: DC
  Administered 2016-06-15: 10 [IU] via SUBCUTANEOUS
  Filled 2016-06-15 (×2): qty 0.1

## 2016-06-15 MED ORDER — ONDANSETRON HCL 4 MG/2ML IJ SOLN: 4.0000 mg | Freq: Four times a day (QID) | INTRAMUSCULAR | Status: DC | PRN

## 2016-06-15 MED ORDER — DEXTROSE 5 % IV SOLN
500.0000 mg | Freq: Four times a day (QID) | INTRAVENOUS | Status: DC | PRN
  Administered 2016-06-15 – 2016-06-18 (×5): 500 mg via INTRAVENOUS
  Filled 2016-06-15 (×10): qty 5

## 2016-06-15 MED ORDER — ASPIRIN EC 325 MG PO TBEC
325.0000 mg | DELAYED_RELEASE_TABLET | Freq: Every day | ORAL | Status: DC
  Administered 2016-06-15: 325 mg via ORAL
  Filled 2016-06-15: qty 1

## 2016-06-15 MED ORDER — VITAMIN B-1 100 MG PO TABS
100.0000 mg | ORAL_TABLET | Freq: Every day | ORAL | Status: DC
Start: 2016-06-15 — End: 2016-06-21
  Administered 2016-06-15 – 2016-06-21 (×6): 100 mg via ORAL
  Filled 2016-06-15 (×6): qty 1

## 2016-06-15 MED ORDER — SODIUM CHLORIDE 0.9 % IV BOLUS (SEPSIS)
1000.0000 mL | Freq: Once | INTRAVENOUS | Status: AC
  Administered 2016-06-15: 1000 mL via INTRAVENOUS

## 2016-06-15 MED ORDER — INSULIN ASPART 100 UNIT/ML ~~LOC~~ SOLN
5.0000 [IU] | Freq: Once | SUBCUTANEOUS | Status: AC
  Administered 2016-06-15: 5 [IU] via SUBCUTANEOUS
  Filled 2016-06-15: qty 1

## 2016-06-15 MED ORDER — MORPHINE SULFATE (PF) 4 MG/ML IV SOLN
4.0000 mg | Freq: Once | INTRAVENOUS | Status: AC
  Administered 2016-06-15: 4 mg via INTRAVENOUS
  Filled 2016-06-15: qty 1

## 2016-06-15 MED ORDER — MORPHINE SULFATE (PF) 2 MG/ML IV SOLN
2.0000 mg | INTRAVENOUS | Status: DC | PRN
  Administered 2016-06-16 (×2): 2 mg via INTRAVENOUS
  Filled 2016-06-15 (×2): qty 1

## 2016-06-15 MED ORDER — ACETAMINOPHEN 650 MG RE SUPP: 650.0000 mg | Freq: Four times a day (QID) | RECTAL | Status: DC | PRN

## 2016-06-15 MED ORDER — FENTANYL CITRATE (PF) 100 MCG/2ML IJ SOLN
50.0000 ug | Freq: Once | INTRAMUSCULAR | Status: AC
  Administered 2016-06-15: 50 ug via INTRAVENOUS
  Filled 2016-06-15: qty 2

## 2016-06-15 MED ORDER — SODIUM CHLORIDE 0.9 % IV SOLN
INTRAVENOUS | Status: DC
  Administered 2016-06-15 – 2016-06-16 (×3): via INTRAVENOUS
  Administered 2016-06-18: 75 mL via INTRAVENOUS
  Administered 2016-06-19: 03:00:00 via INTRAVENOUS

## 2016-06-15 MED ORDER — ATORVASTATIN CALCIUM 10 MG PO TABS
10.0000 mg | ORAL_TABLET | Freq: Every day | ORAL | Status: DC
  Administered 2016-06-15 – 2016-06-20 (×6): 10 mg via ORAL
  Filled 2016-06-15 (×7): qty 1

## 2016-06-15 MED ORDER — ONDANSETRON HCL 4 MG PO TABS: 4.0000 mg | ORAL_TABLET | Freq: Four times a day (QID) | ORAL | Status: DC | PRN

## 2016-06-15 MED ORDER — INSULIN ASPART 100 UNIT/ML ~~LOC~~ SOLN
0.0000 [IU] | SUBCUTANEOUS | Status: DC
  Administered 2016-06-15: 7 [IU] via SUBCUTANEOUS
  Administered 2016-06-15 (×2): 3 [IU] via SUBCUTANEOUS
  Administered 2016-06-16: 1 [IU] via SUBCUTANEOUS
  Administered 2016-06-16: 3 [IU] via SUBCUTANEOUS
  Administered 2016-06-16: 1 [IU] via SUBCUTANEOUS
  Administered 2016-06-16: 3 [IU] via SUBCUTANEOUS

## 2016-06-15 MED ORDER — MUPIROCIN 2 % EX OINT
1.0000 "application " | TOPICAL_OINTMENT | Freq: Two times a day (BID) | CUTANEOUS | Status: AC
  Administered 2016-06-15 – 2016-06-20 (×10): 1 via NASAL
  Filled 2016-06-15: qty 22

## 2016-06-15 MED ORDER — CARVEDILOL 6.25 MG PO TABS
6.2500 mg | ORAL_TABLET | Freq: Two times a day (BID) | ORAL | Status: DC
  Administered 2016-06-15 – 2016-06-21 (×12): 6.25 mg via ORAL
  Filled 2016-06-15 (×13): qty 1

## 2016-06-15 MED ORDER — ACETAMINOPHEN 325 MG PO TABS: 650.0000 mg | ORAL_TABLET | Freq: Four times a day (QID) | ORAL | Status: DC | PRN

## 2016-06-15 MED ORDER — CEFAZOLIN SODIUM-DEXTROSE 2-4 GM/100ML-% IV SOLN
2.0000 g | INTRAVENOUS | Status: AC
  Administered 2016-06-16: 2 g via INTRAVENOUS
  Filled 2016-06-15: qty 100

## 2016-06-15 MED ORDER — RAMIPRIL 10 MG PO CAPS
10.0000 mg | ORAL_CAPSULE | Freq: Every day | ORAL | Status: DC
  Administered 2016-06-15 – 2016-06-21 (×6): 10 mg via ORAL
  Filled 2016-06-15 (×8): qty 1

## 2016-06-15 MED ORDER — POVIDONE-IODINE 10 % EX SWAB: 2.0000 "application " | Freq: Once | CUTANEOUS | Status: DC

## 2016-06-15 MED ORDER — OXYCODONE-ACETAMINOPHEN 5-325 MG PO TABS
1.0000 | ORAL_TABLET | ORAL | Status: DC | PRN
  Administered 2016-06-15: 1 via ORAL
  Filled 2016-06-15 (×2): qty 1

## 2016-06-15 MED ORDER — CHLORHEXIDINE GLUCONATE 4 % EX LIQD: 60.0000 mL | Freq: Once | CUTANEOUS | Status: DC

## 2016-06-15 MED ORDER — CHLORHEXIDINE GLUCONATE CLOTH 2 % EX PADS
6.0000 | MEDICATED_PAD | Freq: Every day | CUTANEOUS | Status: DC
  Administered 2016-06-16 – 2016-06-20 (×4): 6 via TOPICAL

## 2016-06-15 NOTE — ED Notes (Signed)
No respiratory or acute distress noted alert and oriented x 3 call light in reach. 

## 2016-06-15 NOTE — Progress Notes (Signed)
Patient has arrived to the floor via [facility].

## 2016-06-15 NOTE — Progress Notes (Signed)
Called X [phone] and gave report to [place], Therapist, sports, at [place].

## 2016-06-15 NOTE — Progress Notes (Signed)
Pt recently placed in [employer]ucks traction per MD order and currently in severe pain. Text-paged on-call [facility] for increase in pain medication and/or robaxin IV. Awaiting new orders or call back.

## 2016-06-15 NOTE — ED Notes (Signed)
No respiratory or acute distress noted alert and oriented x 3 call light in reach no reaction to medication noted unable to give urine sample at this time but pt is trying to use urinal.

## 2016-06-15 NOTE — Progress Notes (Signed)
Orthopedic Tech Progress Note Patient Details:  [name] [name] [birth year] [id number]  Musculoskeletal Traction Type of Traction: Bucks Skin Traction Traction Location: Lt Leg Traction Weight: 10 lbs Applied bucks traction 10lbs to Footboard.   Charlott Rakes 06/15/2016, 9:38 PM

## 2016-06-15 NOTE — Progress Notes (Signed)
Patient will have surgical intervention for left intratrochanteric fracture in am at [facility], will transfer patient to 5N, will place NPO after midnight.

## 2016-06-15 NOTE — ED Notes (Signed)
Bed: WA17 Expected date:  Expected time:  Means of arrival:  Comments: Hip pain, fall

## 2016-06-15 NOTE — Progress Notes (Signed)
Patient being transported by [facility] to [facility].  Patient A&O x4.  Pain medication given prior to transport.  Patient leaving with personal belongings.  Patient sister at bedside.  Patient on room air.  No s/s of distress.  No complaints.

## 2016-06-15 NOTE — ED Triage Notes (Signed)
Pt comes from home pt had a fall Saturday not witnessed, pt hit his head no loc./ Pt is diabetic and had a blood sugar cbg 517 at 4:08 ems reports. Pt can not stand/ hip issues in the past.  Pt comes from home, medical hx of diabetes 2 , and HTN, TIA. Lives with sister.  V/s bp 140/70, pulse 100, rr 18,  Home [address], [place] [phone]  picked up at sisters.  Other address [address] [place].

## 2016-06-15 NOTE — H&P (Signed)
History and Physical    [name] [name] TKW:[id number] DOB: 08/31/1946 DOA: 06/15/2016  PCP:  Po, [name]   Patient coming from: Home  Chief Complaint: Left lower extremity pain.   HPI: [name] [name] is a 70 y.o. male with medical history significant of chronic back pain, who presents to the hospital with the chief complaint of left lower extremity pain. He sustained a fall early this morning from his own height after tripping on a carpet, he fell backwards hitting his left side, including head trauma without loss of precautions. After the fall he felt excruciating pain on the left lower extremity, which was sharp in nature, 10 out of 10 intensity, no radiation, worse with movement, improved with immobility, no associated symptoms. He was assisted to go back into his bed, due to the persistent pain he was brought into the hospital for further evaluation. He does have degenerative joint disease with chronic back pain, he had surgery on his right hip in the past. He ambulates without help of any walker or cane. He is living with his sister.   ED Course: Pain control and further imaging, orthopedic consultation.  Review of Systems:  1. Gen. no fever chills 2. Cardiovascular. No angina, claudication no PND orthopnea 3. Pulmonary no shortness of breath cough or hemoptysis 4. Gastrointestinal no nausea, vomiting or diarrhea 5. Skeletal positive for left lower extremity pain and chronic back pain 6. Dermatology no rashes 7. Urology no dysuria or increased urinary frequency 8. Endocrinology no tremors, heat or cold tolerance 9. Neurology no seizures or paresthesias 10. Psych no depression or anxiety  Past Medical History:  Diagnosis Date  . Diabetes mellitus without complication ([place])   . Hypertension     Past Surgical History:  Procedure Laterality Date  . HIP ARTHROPLASTY Right 02/05/2013   Procedure: ARTHROPLASTY BIPOLAR HIP;  Surgeon: [name], MD;  Location: [facility];  Service:  Orthopedics;  Laterality: Right;     reports that he quit smoking about 3 years ago. His smoking use included Cigarettes. He has a 7.50 pack-year smoking history. He has never used smokeless tobacco. He reports that he does not drink alcohol or use drugs.  No Known Allergies  Family History  Problem Relation Age of Onset  . Diabetes Mother   . Alzheimer's disease Mother   . Hypertension Mother   . Hyperlipidemia Father   . Hypertension Father   . Healthy Maternal Grandmother   . Pneumonia Maternal Grandfather      Prior to Admission medications   Medication Sig Start Date End Date Taking? Authorizing Provider  acetaminophen (TYLENOL) 325 MG tablet Take 2 tablets (650 mg total) by mouth every 6 (six) hours as needed for mild pain (or Fever >/= 101). 08/14/15   Robbie Lis, MD  alprazolam [name]) 2 MG tablet Take 0.5 tablets (1 mg total) by mouth 2 (two) times daily as needed for anxiety. 08/24/15   Verlee Monte, MD  amLODipine (NORVASC) 10 MG tablet Take 1 tablet (10 mg total) by mouth daily. 08/14/15   Robbie Lis, MD  aspirin EC 325 MG tablet Take 1 tablet (325 mg total) by mouth daily. 06/08/15   Rosalin Hawking, MD  atorvastatin (LIPITOR) 10 MG tablet Take 1 tablet (10 mg total) by mouth daily at 6 PM. Patient taking differently: Take 10 mg by mouth daily.  03/23/15   [name], DO  blood glucose meter kit and supplies Use daily to check blood sugar. DX: E11.09 04/24/16  Golden Circle, [name]  carvedilol (COREG) 6.25 MG tablet Take 1 tablet (6.25 mg total) by mouth 2 (two) times daily with a meal. 05/12/16   Thurnell Lose, MD  clopidogrel (PLAVIX) 75 MG tablet Take 75 mg by mouth daily.    Historical Provider, MD  feeding supplement, GLUCERNA SHAKE, (GLUCERNA SHAKE) LIQD Take 237 mLs by mouth 2 (two) times daily between meals. 08/14/15   Robbie Lis, MD  gabapentin (NEURONTIN) 300 MG capsule Take 1 tablet by mouth daily for 1 day then 1 tablet by mouth twice daily for 1 day and  then 1 tablet by mouth 3 times daily. 03/14/16   Golden Circle, [name]  glucose blood (COOL BLOOD GLUCOSE TEST STRIPS) test strip Use as instructed to check blood sugar up to 3 times a day. DX: E11.09 04/24/16   Golden Circle, [name]  insulin aspart protamine - aspart (NOVOLOG 70/30 MIX) (70-30) 100 UNIT/ML FlexPen Inject 0.24 mLs (24 Units total) into the skin 2 (two) times daily. 05/17/16   Theodis Blaze, MD  Insulin Pen Needle 31G X 5 MM MISC 1 each by Does not apply route 3 (three) times daily. 03/17/16   Golden Circle, [name]  Lancets MISC Use as instructed to check blood sugar up to 3 times a day. DX: E11.09 10/16/15   Golden Circle, [name]  ramipril (ALTACE) 10 MG capsule Take 10 mg by mouth daily.  05/03/16   Historical Provider, MD  thiamine 100 MG tablet Take 1 tablet (100 mg total) by mouth daily. 11/03/15   Nat Math, MD    Physical Exam: Vitals:   06/15/16 0450 06/15/16 0602 06/15/16 0646  BP: 151/72 166/74 162/82  Pulse: 79 80 81  Resp: _0 Temp: 98.3 F (36.8 C) 97.9 F (36.6 C) 97.7 F (36.5 C)  TempSrc: Oral Oral Oral  SpO2: 92% 94% 98%      Constitutional: NAD, calm, comfortable Vitals:   06/15/16 0450 06/15/16 0602 06/15/16 0646  BP: 151/72 166/74 162/82  Pulse: 79 80 81  Resp: _1 Temp: 98.3 F (36.8 C) 97.9 F (36.6 C) 97.7 F (36.5 C)  TempSrc: Oral Oral Oral  SpO2: 92% 94% 98%   Eyes: PERRL, lids and conjunctivae normal ENMT: Mucous membranes are moist. Posterior pharynx clear of any exudate or lesions.Normal dentition. Nose and Ears without deformities Neck: normal, supple, no masses, no thyromegaly Respiratory: clear to auscultation bilaterally, no wheezing, no crackles. Normal respiratory effort. No accessory muscle use. Mild decreased breath sounds at bases Cardiovascular: Regular rate and rhythm, no murmurs / rubs / gallops. No extremity edema. 2+ pedal pulses. No carotid bruits.  Abdomen: no tenderness, no masses palpated. No  hepatosplenomegaly. Bowel sounds positive.  Musculoskeletal: no clubbing / cyanosis.  Left lower extremity is externally rotated and shortened compared to the right decreased mobility due to pain. Right lower extremity without deformities or weakness. Skin: no rashes, lesions, ulcers. No induration Neurologic: CN 2-12 grossly intact. Sensation intact, DTR normal. Decreased mobility on the left lower extremity due to pain.   Labs on Admission: I have personally reviewed following labs and imaging studies  CBC:  Recent Labs Lab 06/15/16 0521 06/15/16 0534  WBC  --  8.1  NEUTROABS  --  6.0  HGB 10.2* 8.9*  HCT 30.0* 27.1*  MCV  --  89.1  PLT  --  676   Basic Metabolic Panel:  Recent Labs Lab 06/15/16 0511 06/15/16 0521  NA 135 136  K 4.6 5.0  CL 104 102  CO2 22  --   GLUCOSE 492* 477*  BUN 30* 34*  CREATININE 2.09* 2.00*  CALCIUM 8.6*  --    GFR: CrCl cannot be calculated (Unknown ideal weight.). Liver Function Tests:  Recent Labs Lab 06/15/16 0511  AST 19  ALT 13*  ALKPHOS 70  BILITOT 1.0  PROT 6.9  ALBUMIN 3.8   No results for input(s): LIPASE, AMYLASE in the last 168 hours. No results for input(s): AMMONIA in the last 168 hours. Coagulation Profile:  Recent Labs Lab 06/15/16 0511  INR 0.97   Cardiac Enzymes: No results for input(s): CKTOTAL, CKMB, CKMBINDEX, TROPONINI in the last 168 hours. BNP (last 3 results) No results for input(s): PROBNP in the last 8760 hours. HbA1C: No results for input(s): HGBA1C in the last 72 hours. CBG:  Recent Labs Lab 06/15/16 0458  GLUCAP 467*   Lipid Profile: No results for input(s): CHOL, HDL, LDLCALC, TRIG, CHOLHDL, LDLDIRECT in the last 72 hours. Thyroid Function Tests: No results for input(s): TSH, T4TOTAL, FREET4, T3FREE, THYROIDAB in the last 72 hours. Anemia Panel: No results for input(s): VITAMINB12, FOLATE, FERRITIN, TIBC, IRON, RETICCTPCT in the last 72 hours. Urine analysis:    Component Value  Date/Time   COLORURINE YELLOW 05/15/2016 [place] 05/15/2016 1658   LABSPEC 1.024 05/15/2016 1658   PHURINE 5.5 05/15/2016 1658   GLUCOSEU >1000 (A) 05/15/2016 1658   HGBUR TRACE (A) 05/15/2016 1658   BILIRUBINUR NEGATIVE 05/15/2016 1658   KETONESUR NEGATIVE 05/15/2016 1658   PROTEINUR 100 (A) 05/15/2016 1658   UROBILINOGEN 0.2 08/21/2015 2154   NITRITE NEGATIVE 05/15/2016 1658   LEUKOCYTESUR NEGATIVE 05/15/2016 1658   Sepsis Labs: !!!!!!!!!!!!!!!!!!!!!!!!!!!!!!!!!!!!!!!!!!!! _0 (procalcitonin:4,lacticidven:4) )No results found for this or any previous visit (from the past 240 hour(s)).   Radiological Exams on Admission: Dg Chest 1 View  Result Date: 06/15/2016 CLINICAL DATA:  Acute onset of left hip pain and deformity after fall. Initial encounter. EXAM: CHEST 1 VIEW COMPARISON:  Chest radiograph performed 05/15/2016 FINDINGS: The lungs are well-aerated and clear. There is no evidence of focal opacification, pleural effusion or pneumothorax. The cardiomediastinal silhouette is within normal limits. No acute osseous abnormalities are seen. IMPRESSION: No acute cardiopulmonary process seen. No displaced rib fractures identified. Electronically Signed   By: Garald Balding M.D.   On: 06/15/2016 06:20   Ct Head Wo Contrast  Result Date: 06/15/2016 CLINICAL DATA:  Status post fall, hitting head. Concern for head or cervical spine injury. Initial encounter. EXAM: CT HEAD WITHOUT CONTRAST CT CERVICAL SPINE WITHOUT CONTRAST TECHNIQUE: Multidetector CT imaging of the head and cervical spine was performed following the standard protocol without intravenous contrast. Multiplanar CT image reconstructions of the cervical spine were also generated. COMPARISON:  CT of the head performed 05/15/2016, and CT of the cervical spine performed 08/07/2015. MRI of the brain performed 07/07/2015 FINDINGS: CT HEAD FINDINGS There is no evidence of acute infarction, mass lesion, or intra- or  extra-axial hemorrhage on CT. Prominence of the ventricles and sulci reflects mild cortical volume loss. Mild cerebellar atrophy is noted. Scattered periventricular and subcortical white matter change likely reflects small vessel ischemic microangiopathy. The brainstem and fourth ventricle are within normal limits. The basal ganglia are unremarkable in appearance. The cerebral hemispheres demonstrate grossly normal gray-white differentiation. No mass effect or midline shift is seen. There is no evidence of fracture; visualized osseous structures are unremarkable in appearance. The visualized portions of the orbits  are within normal limits. There is mild partial opacification of the left maxillary sinus. The remaining paranasal sinuses and mastoid air cells are well-aerated. No significant soft tissue abnormalities are seen. CT CERVICAL SPINE FINDINGS There is no evidence of fracture or subluxation. Vertebral bodies demonstrate normal height and alignment. Multilevel disc space narrowing and endplate sclerotic change are noted along the cervical spine, with anterior and posterior disc osteophyte complexes. Prevertebral soft tissues are within normal limits. The thyroid gland is unremarkable in appearance. The visualized lung apices are clear. Minimal calcification is seen at the carotid bifurcations bilaterally. IMPRESSION: 1. No evidence of traumatic intracranial injury or fracture. 2. No evidence of fracture or subluxation along the cervical spine. 3. Mild cortical volume loss and scattered small vessel ischemic microangiopathy. 4. Mild degenerative change along the cervical spine. 5. Mild partial opacification of the left maxillary sinus. Electronically Signed   By: Garald Balding M.D.   On: 06/15/2016 05:55   Ct Cervical Spine Wo Contrast  Result Date: 06/15/2016 CLINICAL DATA:  Status post fall, hitting head. Concern for head or cervical spine injury. Initial encounter. EXAM: CT HEAD WITHOUT CONTRAST CT  CERVICAL SPINE WITHOUT CONTRAST TECHNIQUE: Multidetector CT imaging of the head and cervical spine was performed following the standard protocol without intravenous contrast. Multiplanar CT image reconstructions of the cervical spine were also generated. COMPARISON:  CT of the head performed 05/15/2016, and CT of the cervical spine performed 08/07/2015. MRI of the brain performed 07/07/2015 FINDINGS: CT HEAD FINDINGS There is no evidence of acute infarction, mass lesion, or intra- or extra-axial hemorrhage on CT. Prominence of the ventricles and sulci reflects mild cortical volume loss. Mild cerebellar atrophy is noted. Scattered periventricular and subcortical white matter change likely reflects small vessel ischemic microangiopathy. The brainstem and fourth ventricle are within normal limits. The basal ganglia are unremarkable in appearance. The cerebral hemispheres demonstrate grossly normal gray-white differentiation. No mass effect or midline shift is seen. There is no evidence of fracture; visualized osseous structures are unremarkable in appearance. The visualized portions of the orbits are within normal limits. There is mild partial opacification of the left maxillary sinus. The remaining paranasal sinuses and mastoid air cells are well-aerated. No significant soft tissue abnormalities are seen. CT CERVICAL SPINE FINDINGS There is no evidence of fracture or subluxation. Vertebral bodies demonstrate normal height and alignment. Multilevel disc space narrowing and endplate sclerotic change are noted along the cervical spine, with anterior and posterior disc osteophyte complexes. Prevertebral soft tissues are within normal limits. The thyroid gland is unremarkable in appearance. The visualized lung apices are clear. Minimal calcification is seen at the carotid bifurcations bilaterally. IMPRESSION: 1. No evidence of traumatic intracranial injury or fracture. 2. No evidence of fracture or subluxation along the  cervical spine. 3. Mild cortical volume loss and scattered small vessel ischemic microangiopathy. 4. Mild degenerative change along the cervical spine. 5. Mild partial opacification of the left maxillary sinus. Electronically Signed   By: Garald Balding M.D.   On: 06/15/2016 05:55   Dg Hip Unilat W Or Wo Pelvis 2-3 Views Left  Result Date: 06/15/2016 CLINICAL DATA:  Status post unwitnessed fall, with left hip pain and deformity. Initial encounter. EXAM: DG HIP (WITH OR WITHOUT PELVIS) 2-3V LEFT COMPARISON:  None. FINDINGS: There is a comminuted left femoral intertrochanteric fracture, with mild displacement. The left femoral head remains seated at the acetabulum. The right hip arthroplasty is grossly unremarkable in appearance. No additional fractures are seen. The sacroiliac joints are  within normal limits. Mild degenerative change is noted at the lower lumbar spine. Soft tissue swelling is noted about the left hip. IMPRESSION: Comminuted left femoral intertrochanteric fracture, with mild displacement. Electronically Signed   By: Garald Balding M.D.   On: 06/15/2016 06:21    EKG: Independently reviewed. Normal sinus rhythm rate 82 bpm, normal axis, normal intervals, no ST elevation or depression, no significant T-wave abnormalities.  Chest film: Personally reviewed,  AP film which is hypoinflated, good penetration, no significant rotation, no infiltrates, effusions or signs of pneumothorax. Noted right fissure.  Assessment/Plan Active Problems:   Closed left subtrochanteric femur fracture [facility])   This is a 70 year old gentleman who presents after sustaining a fall from his own height, developing significant pain on today's left lower extremity, unable to ambulate without assistance. His initial physical examination he is afebrile, his blood pressure is 162/82, heart rate 81, respiratory 18, oxygen saturation 98% on room air. His mucous membranes are moist, his lungs are clear to auscultation, heart  S1-S2 present and rhythmic. Sodium is 136, potassium 5.0, chloride 102, BUN 34, creatinine 2.00, white count 8.1, hemoglobin 8.9, hematocrit 27.1, platelet count 186. Lower extremity x-ray showing comminuted left femoral intertrochanteric fracture with mild displacement.  Working diagnosis. Left lower extremity pain due to left femoral intratrochanteric fracture.   1. Left femoral intratrochanteric fracture. We'll keep patient nothing by mouth, will continue supportive medical therapy with IV morphine as needed for pain. DVT prophylaxis with mechanical compression devices. Patient will be evaluated by orthopedic service. Patient seems to be functional at home able to do at least 4 metabolic equivalents, no active cardiovascular disease. From the cardiovascular perspective since to be low to intermediate risk for a intermediate risk procedure. Physical therapy evaluation after procedure.   2. Hypertension. Will resume patient's antihypertensive agents with carvedilol 6.25 g twice daily and ramipril 10 mg daily. At home on dual antiplatelet therapy. We'll hold amlodipine for now.   3. Chronic kidney disease stage III. Will continue hydration with normal saline at 75 mL per hour. Potassium is 5.0. Will follow kidney function in the morning, avoid hypotension or nephrotoxic agents.  4. T2 DM. At home patient is on insulin 70/30 24 units twice daily, will start patient on insulin sliding scale and will calculate insulin requirements, will check serum glucose every 4 hours for the first 24 hours. Depending on serum glucose will adjust basal regimen of long-acting insulin. For now will start on 10 units of Levemir for a basal regimen.  Patient will be kept nothing by mouth for now for eventual surgical procedure. He has been admitted to the hospital in late July and early August for uncontrolled diabetes including diabetic ketoacidosis.  5. Dyslipidemia continue atorvastatin.  6. Ischemic CVA. Will continue  aspirin for now, hold Plavix for possible surgical procedure, they should be resumed when safe after surgery. Continue statin.   Patient continued high risk of developing medical complications related to his nonambulatory state.   DVT prophylaxis: scd Code Status: Full Family Communication: No family at bedside  Disposition Plan: SNF  Consults called: Orthopedics Admission status: Inpatient.    Noal Abshier Gerome Apley MD [facility] Pager [phone]  If 7PM-7AM, please contact night-coverage [email or web address] Password [email or web address]  06/15/2016, 7:50 AM

## 2016-06-15 NOTE — ED Notes (Signed)
Pt still in x-ray

## 2016-06-15 NOTE — ED Provider Notes (Addendum)
[facility] DEPT Provider Note   CSN: [id number] Arrival date & time: 06/15/16  0439     History   Chief Complaint Chief Complaint  Patient presents with  . Hip Pain    HPI [name] [name] is a 70 y.o. male.  The history is provided by the patient.  Hip Pain  This is a new problem. The current episode started yesterday. The problem has not changed since onset.Pertinent negatives include no chest pain, no abdominal pain and no shortness of breath. Nothing aggravates the symptoms. The treatment provided no relief.  Fall  This is a new problem. The problem occurs constantly. Pertinent negatives include no chest pain, no abdominal pain and no shortness of breath. Nothing aggravates the symptoms. Nothing relieves the symptoms. The treatment provided no relief.  Fall on Plavix yesterday.  Did hit head.  Denies LOC.   Swelling over the left hip.    Past Medical History:  Diagnosis Date  . Diabetes mellitus without complication ([place])   . Hypertension     Patient Active Problem List   Diagnosis Date Noted  . Right hip pain 05/26/2016  . Hyperglycemia 05/15/2016  . Diabetic hyperosmolar non-ketotic state ([place]) 05/15/2016  . Hyponatremia 05/15/2016  . Anxiety 05/15/2016  . History of CVA (cerebrovascular accident) 05/15/2016  . DKA, type 1 ([place]) 05/09/2016  . Acute on chronic renal failure ([place]) 05/09/2016  . Volume depletion 05/09/2016  . DKA (diabetic ketoacidoses) ([place]) 05/09/2016  . Chronic prescription benzodiazepine use 11/02/2015  . Narcotic dependency, continuous ([place]) 11/02/2015  . Uncontrolled type 2 diabetes mellitus with diabetic nephropathy, with long-term current use of insulin ([place]) 08/12/2015  . Benign essential HTN 08/12/2015  . Dyslipidemia associated with type 2 diabetes mellitus ([place]) 08/12/2015  . Depression 08/12/2015  . 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 CKD (chronic kidney disease) stage 3, GFR  30-59 ml/min 03/20/2014  . Anemia of chronic disease 02/02/2013    Past Surgical History:  Procedure Laterality Date  . HIP ARTHROPLASTY Right 02/05/2013   Procedure: ARTHROPLASTY BIPOLAR HIP;  Surgeon: [name], MD;  Location: [facility] ORS;  Service: Orthopedics;  Laterality: Right;       Home Medications    Prior to Admission medications   Medication Sig Start Date End Date Taking? Authorizing Provider  acetaminophen (TYLENOL) 325 MG tablet Take 2 tablets (650 mg total) by mouth every 6 (six) hours as needed for mild pain (or Fever >/= 101). 08/14/15   Robbie Lis, MD  alprazolam [name]) 2 MG tablet Take 0.5 tablets (1 mg total) by mouth 2 (two) times daily as needed for anxiety. 08/24/15   Verlee Monte, MD  amLODipine (NORVASC) 10 MG tablet Take 1 tablet (10 mg total) by mouth daily. 08/14/15   Robbie Lis, MD  aspirin EC 325 MG tablet Take 1 tablet (325 mg total) by mouth daily. 06/08/15   Rosalin Hawking, MD  atorvastatin (LIPITOR) 10 MG tablet Take 1 tablet (10 mg total) by mouth daily at 6 PM. Patient taking differently: Take 10 mg by mouth daily.  03/23/15   [name], DO  blood glucose meter kit and supplies Use daily to check blood sugar. DX: E11.09 04/24/16   Golden Circle, [name]  carvedilol (COREG) 6.25 MG tablet Take 1 tablet (6.25 mg total) by mouth 2 (two) times daily with a meal. 05/12/16   Thurnell Lose, MD  clopidogrel (PLAVIX) 75 MG tablet Take 75 mg by  mouth daily.    Historical Provider, MD  feeding supplement, GLUCERNA SHAKE, (GLUCERNA SHAKE) LIQD Take 237 mLs by mouth 2 (two) times daily between meals. 08/14/15   Robbie Lis, MD  gabapentin (NEURONTIN) 300 MG capsule Take 1 tablet by mouth daily for 1 day then 1 tablet by mouth twice daily for 1 day and then 1 tablet by mouth 3 times daily. 03/14/16   Golden Circle, [name]  glucose blood (COOL BLOOD GLUCOSE TEST STRIPS) test strip Use as instructed to check blood sugar up to 3 times a day. DX: E11.09 04/24/16    Golden Circle, [name]  insulin aspart protamine - aspart (NOVOLOG 70/30 MIX) (70-30) 100 UNIT/ML FlexPen Inject 0.24 mLs (24 Units total) into the skin 2 (two) times daily. 05/17/16   Theodis Blaze, MD  Insulin Pen Needle 31G X 5 MM MISC 1 each by Does not apply route 3 (three) times daily. 03/17/16   Golden Circle, [name]  Lancets MISC Use as instructed to check blood sugar up to 3 times a day. DX: E11.09 10/16/15   Golden Circle, [name]  ramipril (ALTACE) 10 MG capsule Take 10 mg by mouth daily.  05/03/16   Historical Provider, MD  thiamine 100 MG tablet Take 1 tablet (100 mg total) by mouth daily. 11/03/15   Nat Math, MD    Family History Family History  Problem Relation Age of Onset  . Diabetes Mother   . Alzheimer's disease Mother   . Hypertension Mother   . Hyperlipidemia Father   . Hypertension Father   . Healthy Maternal Grandmother   . Pneumonia Maternal Grandfather     Social History Social History  Substance Use Topics  . Smoking status: Former Smoker    Packs/day: 0.25    Years: 30.00    Types: Cigarettes    Quit date: 01/31/2013  . Smokeless tobacco: Never Used  . Alcohol use No     Allergies   Review of patient's allergies indicates no known allergies.   Review of Systems Review of Systems  Respiratory: Negative for shortness of breath.   Cardiovascular: Negative for chest pain.  Gastrointestinal: Negative for abdominal pain.  Musculoskeletal: Positive for arthralgias and gait problem. Negative for back pain.  All other systems reviewed and are negative.    Physical Exam Updated Vital Signs There were no vitals taken for this visit.  Physical Exam  Constitutional: He appears well-developed and well-nourished.  HENT:  Head: Normocephalic. Head is without raccoon's eyes and without Battle's sign.  Right Ear: No hemotympanum.  Left Ear: No hemotympanum.  Mouth/Throat: Oropharynx is clear and moist.  Eyes: Pupils are equal, round, and reactive to light.   Neck: Normal range of motion. Neck supple.  Cardiovascular: Regular rhythm and intact distal pulses.   Pulmonary/Chest: Breath sounds normal. No respiratory distress. He has no wheezes. He has no rales.  Abdominal: Soft. Bowel sounds are normal. He exhibits no mass. There is no tenderness. There is no rebound and no guarding.  Musculoskeletal: Normal range of motion. He exhibits tenderness.       Left hip: He exhibits swelling. He exhibits no crepitus and no laceration.       Left knee: Normal.       Left ankle: Normal. Achilles tendon normal.       Left foot: Normal.  Neurological: He is alert. He has normal reflexes.  Skin: Skin is warm and dry. Capillary refill takes less than 2 seconds. He is  not diaphoretic.     ED Treatments / Results  Labs (all labs ordered are listed, but only abnormal results are displayed) Labs Reviewed  CBC WITH DIFFERENTIAL/PLATELET  COMPREHENSIVE METABOLIC PANEL  PROTIME-INR  URINALYSIS, ROUTINE W REFLEX MICROSCOPIC (NOT AT [facility])  I-STAT CHEM 8, ED  CBG MONITORING, ED    EKG  EKG Interpretation None       Radiology No results found.  Procedures Procedures (including critical care time)  Medications Ordered in ED Medications  sodium chloride 0.9 % bolus 1,000 mL (not administered)     Initial Impression / Assessment and Plan / ED Course  I have reviewed the triage vital signs and the nursing notes.  Pertinent labs & imaging results that were available during my care of the patient were reviewed by me and considered in my medical decision making (see chart for details).  Clinical Course   Vitals:   06/15/16 0450 06/15/16 0602  BP: 151/72 166/74  Pulse: 79 80  Resp: 20 20  Temp: 98.3 F (36.8 C) 97.9 F (36.6 C)   Results for orders placed or performed during the hospital encounter of 06/15/16  Comprehensive metabolic panel  Result Value Ref Range   Sodium 135 135 - 145 mmol/L   Potassium 4.6 3.5 - 5.1 mmol/L   Chloride  104 101 - 111 mmol/L   CO2 22 22 - 32 mmol/L   Glucose, Bld 492 (H) 65 - 99 mg/dL   BUN 30 (H) 6 - 20 mg/dL   Creatinine, Ser 2.09 (H) 0.61 - 1.24 mg/dL   Calcium 8.6 (L) 8.9 - 10.3 mg/dL   Total Protein 6.9 6.5 - 8.1 g/dL   Albumin 3.8 3.5 - 5.0 g/dL   AST 19 15 - 41 U/L   ALT 13 (L) 17 - 63 U/L   Alkaline Phosphatase 70 38 - 126 U/L   Total Bilirubin 1.0 0.3 - 1.2 mg/dL   GFR calc non Af Amer 30 (L) >60 mL/min   GFR calc Af Amer 35 (L) >60 mL/min   Anion gap 9 5 - 15  Protime-INR  Result Value Ref Range   Prothrombin Time 12.9 11.4 - 15.2 seconds   INR 0.97   CBC with Differential  Result Value Ref Range   WBC 8.1 4.0 - 10.5 K/uL   RBC 3.04 (L) 4.22 - 5.81 MIL/uL   Hemoglobin 8.9 (L) 13.0 - 17.0 g/dL   HCT 27.1 (L) 39.0 - 52.0 %   MCV 89.1 78.0 - 100.0 fL   MCH 29.3 26.0 - 34.0 pg   MCHC 32.8 30.0 - 36.0 g/dL   RDW 14.1 11.5 - 15.5 %   Platelets 186 150 - 400 K/uL   Neutrophils Relative % 74 %   Neutro Abs 6.0 1.7 - 7.7 K/uL   Lymphocytes Relative 13 %   Lymphs Abs 1.0 0.7 - 4.0 K/uL   Monocytes Relative 12 %   Monocytes Absolute 1.0 0.1 - 1.0 K/uL   Eosinophils Relative 1 %   Eosinophils Absolute 0.1 0.0 - 0.7 K/uL   Basophils Relative 0 %   Basophils Absolute 0.0 0.0 - 0.1 K/uL  I-stat chem 8, ed  Result Value Ref Range   Sodium 136 135 - 145 mmol/L   Potassium 5.0 3.5 - 5.1 mmol/L   Chloride 102 101 - 111 mmol/L   BUN 34 (H) 6 - 20 mg/dL   Creatinine, Ser 2.00 (H) 0.61 - 1.24 mg/dL   Glucose, Bld 477 (H) 65 -  99 mg/dL   Calcium, Ion 1.12 (L) 1.15 - 1.40 mmol/L   TCO2 24 0 - 100 mmol/L   Hemoglobin 10.2 (L) 13.0 - 17.0 g/dL   HCT 30.0 (L) 39.0 - 52.0 %  POC CBG, ED  Result Value Ref Range   Glucose-Capillary 467 (H) 65 - 99 mg/dL   Ct Head Wo Contrast  Result Date: 06/15/2016 CLINICAL DATA:  Status post fall, hitting head. Concern for head or cervical spine injury. Initial encounter. EXAM: CT HEAD WITHOUT CONTRAST CT CERVICAL SPINE WITHOUT CONTRAST  TECHNIQUE: Multidetector CT imaging of the head and cervical spine was performed following the standard protocol without intravenous contrast. Multiplanar CT image reconstructions of the cervical spine were also generated. COMPARISON:  CT of the head performed 05/15/2016, and CT of the cervical spine performed 08/07/2015. MRI of the brain performed 07/07/2015 FINDINGS: CT HEAD FINDINGS There is no evidence of acute infarction, mass lesion, or intra- or extra-axial hemorrhage on CT. Prominence of the ventricles and sulci reflects mild cortical volume loss. Mild cerebellar atrophy is noted. Scattered periventricular and subcortical white matter change likely reflects small vessel ischemic microangiopathy. The brainstem and fourth ventricle are within normal limits. The basal ganglia are unremarkable in appearance. The cerebral hemispheres demonstrate grossly normal gray-white differentiation. No mass effect or midline shift is seen. There is no evidence of fracture; visualized osseous structures are unremarkable in appearance. The visualized portions of the orbits are within normal limits. There is mild partial opacification of the left maxillary sinus. The remaining paranasal sinuses and mastoid air cells are well-aerated. No significant soft tissue abnormalities are seen. CT CERVICAL SPINE FINDINGS There is no evidence of fracture or subluxation. Vertebral bodies demonstrate normal height and alignment. Multilevel disc space narrowing and endplate sclerotic change are noted along the cervical spine, with anterior and posterior disc osteophyte complexes. Prevertebral soft tissues are within normal limits. The thyroid gland is unremarkable in appearance. The visualized lung apices are clear. Minimal calcification is seen at the carotid bifurcations bilaterally. IMPRESSION: 1. No evidence of traumatic intracranial injury or fracture. 2. No evidence of fracture or subluxation along the cervical spine. 3. Mild cortical  volume loss and scattered small vessel ischemic microangiopathy. 4. Mild degenerative change along the cervical spine. 5. Mild partial opacification of the left maxillary sinus. Electronically Signed   By: Garald Balding M.D.   On: 06/15/2016 05:55   Ct Cervical Spine Wo Contrast  Result Date: 06/15/2016 CLINICAL DATA:  Status post fall, hitting head. Concern for head or cervical spine injury. Initial encounter. EXAM: CT HEAD WITHOUT CONTRAST CT CERVICAL SPINE WITHOUT CONTRAST TECHNIQUE: Multidetector CT imaging of the head and cervical spine was performed following the standard protocol without intravenous contrast. Multiplanar CT image reconstructions of the cervical spine were also generated. COMPARISON:  CT of the head performed 05/15/2016, and CT of the cervical spine performed 08/07/2015. MRI of the brain performed 07/07/2015 FINDINGS: CT HEAD FINDINGS There is no evidence of acute infarction, mass lesion, or intra- or extra-axial hemorrhage on CT. Prominence of the ventricles and sulci reflects mild cortical volume loss. Mild cerebellar atrophy is noted. Scattered periventricular and subcortical white matter change likely reflects small vessel ischemic microangiopathy. The brainstem and fourth ventricle are within normal limits. The basal ganglia are unremarkable in appearance. The cerebral hemispheres demonstrate grossly normal gray-white differentiation. No mass effect or midline shift is seen. There is no evidence of fracture; visualized osseous structures are unremarkable in appearance. The visualized portions of the orbits  are within normal limits. There is mild partial opacification of the left maxillary sinus. The remaining paranasal sinuses and mastoid air cells are well-aerated. No significant soft tissue abnormalities are seen. CT CERVICAL SPINE FINDINGS There is no evidence of fracture or subluxation. Vertebral bodies demonstrate normal height and alignment. Multilevel disc space narrowing and  endplate sclerotic change are noted along the cervical spine, with anterior and posterior disc osteophyte complexes. Prevertebral soft tissues are within normal limits. The thyroid gland is unremarkable in appearance. The visualized lung apices are clear. Minimal calcification is seen at the carotid bifurcations bilaterally. IMPRESSION: 1. No evidence of traumatic intracranial injury or fracture. 2. No evidence of fracture or subluxation along the cervical spine. 3. Mild cortical volume loss and scattered small vessel ischemic microangiopathy. 4. Mild degenerative change along the cervical spine. 5. Mild partial opacification of the left maxillary sinus. Electronically Signed   By: Garald Balding M.D.   On: 06/15/2016 05:55   Medications  sodium chloride 0.9 % bolus 1,000 mL (0 mLs Intravenous Stopped 06/15/16 0617)  sodium chloride 0.9 % bolus 1,000 mL (1,000 mLs Intravenous New Bag/Given 06/15/16 0616)  fentaNYL (SUBLIMAZE) injection 50 mcg (50 mcg Intravenous Given 06/15/16 0614)  insulin aspart (novoLOG) injection 5 Units (5 Units Subcutaneous Given 06/15/16 4159)    To be admitted by hospitalists  Final Clinical Impressions(s) / ED Diagnoses   Final diagnoses:  None   Case d/w Dr. Rolena Infante of orthopedics who will see the patient today.   New Prescriptions New Prescriptions   No medications on file     Brock Larmon, MD 06/15/16 [id number]    Celester Morgan, MD 06/15/16 [phone]

## 2016-06-15 NOTE — ED Notes (Signed)
Gave report to [place], [place]

## 2016-06-15 NOTE — Progress Notes (Addendum)
Patient ID: [name] [name], male   DOB: 13-Aug-1946, 70 y.o.   MRN: [id number]    Patient ID: [name] [name] MRN: [id number] DOB/AGE: [birth year] 70 y.o.  Admit date: 06/15/2016  Admission Diagnoses:  Active Problems:   Closed left subtrochanteric femur fracture ([name])   HPI: Pleasant 70 year old pt who sustained a trama last eveing around 6:30 pm.  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 The pt reports tripping over a rug in his bedroom.  He reports hitting his head.  He said his right hip used to come out of place before his replacement and if he rolled on it it will slip back in place.  He said he thought that is what was wrong with his left hip.  He realized his injury was more severe and presented to the ED.  Past Medical History: Past Medical History:  Diagnosis Date  . Diabetes mellitus without complication ([place])   . Hypertension     Surgical History: Past Surgical History:  Procedure Laterality Date  . HIP ARTHROPLASTY Right 02/05/2013   Procedure: ARTHROPLASTY BIPOLAR HIP;  Surgeon: [name], MD;  Location: [facility];  Service: Orthopedics;  Laterality: Right;    Family History: Family History  Problem Relation Age of Onset  . Diabetes Mother   . Alzheimer's disease Mother   . Hypertension Mother   . Hyperlipidemia Father   . Hypertension Father   . Healthy Maternal Grandmother   . Pneumonia Maternal Grandfather     Social History: Social History   Social History  . Marital status: Single    Spouse name: N/A  . Number of children: 2  . Years of education: 13   Occupational History  . Retired    Social History Main Topics  . Smoking status: Former Smoker    Packs/day: 0.25    Years: 30.00    Types: Cigarettes    Quit date: 01/31/2013  . Smokeless tobacco: Never Used  . Alcohol use No  . Drug use: No  . Sexual activity: No   Other Topics Concern  . Not on file   Social History Narrative   Fun: Likes to do handyman related projects,  photography.    Denies religious beliefs effecting health care.     Allergies: Review of patient's allergies indicates no known allergies.  Medications: I have reviewed the patient's current medications.  Vital Signs: Patient Vitals for the past 24 hrs:  BP Temp Temp src Pulse Resp SpO2  06/15/16 0646 162/82 97.7 F (36.5 C) Oral 81 18 98 %  06/15/16 0602 166/74 97.9 F (36.6 C) Oral 80 20 94 %  06/15/16 0450 151/72 98.3 F (36.8 C) Oral 79 20 92 %    Radiology: Dg Chest 1 View  Result Date: 06/15/2016 CLINICAL DATA:  Acute onset of left hip pain and deformity after fall. Initial encounter. EXAM: CHEST 1 VIEW COMPARISON:  Chest radiograph performed 05/15/2016 FINDINGS: The lungs are well-aerated and clear. There is no evidence of focal opacification, pleural effusion or pneumothorax. The cardiomediastinal silhouette is within normal limits. No acute osseous abnormalities are seen. IMPRESSION: No acute cardiopulmonary process seen. No displaced rib fractures identified. Electronically Signed   By: Garald Balding M.D.   On: 06/15/2016 06:20   Ct Head Wo Contrast  Result Date: 06/15/2016 CLINICAL DATA:  Status post fall, hitting head. Concern for head or cervical spine injury. Initial encounter. EXAM: CT HEAD WITHOUT CONTRAST CT CERVICAL SPINE WITHOUT CONTRAST TECHNIQUE: Multidetector  CT imaging of the head and cervical spine was performed following the standard protocol without intravenous contrast. Multiplanar CT image reconstructions of the cervical spine were also generated. COMPARISON:  CT of the head performed 05/15/2016, and CT of the cervical spine performed 08/07/2015. MRI of the brain performed 07/07/2015 FINDINGS: CT HEAD FINDINGS There is no evidence of acute infarction, mass lesion, or intra- or extra-axial hemorrhage on CT. Prominence of the ventricles and sulci reflects mild cortical volume loss. Mild cerebellar atrophy is noted. Scattered periventricular and subcortical white  matter change likely reflects small vessel ischemic microangiopathy. The brainstem and fourth ventricle are within normal limits. The basal ganglia are unremarkable in appearance. The cerebral hemispheres demonstrate grossly normal gray-white differentiation. No mass effect or midline shift is seen. There is no evidence of fracture; visualized osseous structures are unremarkable in appearance. The visualized portions of the orbits are within normal limits. There is mild partial opacification of the left maxillary sinus. The remaining paranasal sinuses and mastoid air cells are well-aerated. No significant soft tissue abnormalities are seen. CT CERVICAL SPINE FINDINGS There is no evidence of fracture or subluxation. Vertebral bodies demonstrate normal height and alignment. Multilevel disc space narrowing and endplate sclerotic change are noted along the cervical spine, with anterior and posterior disc osteophyte complexes. Prevertebral soft tissues are within normal limits. The thyroid gland is unremarkable in appearance. The visualized lung apices are clear. Minimal calcification is seen at the carotid bifurcations bilaterally. IMPRESSION: 1. No evidence of traumatic intracranial injury or fracture. 2. No evidence of fracture or subluxation along the cervical spine. 3. Mild cortical volume loss and scattered small vessel ischemic microangiopathy. 4. Mild degenerative change along the cervical spine. 5. Mild partial opacification of the left maxillary sinus. Electronically Signed   By: Garald Balding M.D.   On: 06/15/2016 05:55   Ct Cervical Spine Wo Contrast  Result Date: 06/15/2016 CLINICAL DATA:  Status post fall, hitting head. Concern for head or cervical spine injury. Initial encounter. EXAM: CT HEAD WITHOUT CONTRAST CT CERVICAL SPINE WITHOUT CONTRAST TECHNIQUE: Multidetector CT imaging of the head and cervical spine was performed following the standard protocol without intravenous contrast. Multiplanar CT  image reconstructions of the cervical spine were also generated. COMPARISON:  CT of the head performed 05/15/2016, and CT of the cervical spine performed 08/07/2015. MRI of the brain performed 07/07/2015 FINDINGS: CT HEAD FINDINGS There is no evidence of acute infarction, mass lesion, or intra- or extra-axial hemorrhage on CT. Prominence of the ventricles and sulci reflects mild cortical volume loss. Mild cerebellar atrophy is noted. Scattered periventricular and subcortical white matter change likely reflects small vessel ischemic microangiopathy. The brainstem and fourth ventricle are within normal limits. The basal ganglia are unremarkable in appearance. The cerebral hemispheres demonstrate grossly normal gray-white differentiation. No mass effect or midline shift is seen. There is no evidence of fracture; visualized osseous structures are unremarkable in appearance. The visualized portions of the orbits are within normal limits. There is mild partial opacification of the left maxillary sinus. The remaining paranasal sinuses and mastoid air cells are well-aerated. No significant soft tissue abnormalities are seen. CT CERVICAL SPINE FINDINGS There is no evidence of fracture or subluxation. Vertebral bodies demonstrate normal height and alignment. Multilevel disc space narrowing and endplate sclerotic change are noted along the cervical spine, with anterior and posterior disc osteophyte complexes. Prevertebral soft tissues are within normal limits. The thyroid gland is unremarkable in appearance. The visualized lung apices are clear. Minimal calcification is seen  at the carotid bifurcations bilaterally. IMPRESSION: 1. No evidence of traumatic intracranial injury or fracture. 2. No evidence of fracture or subluxation along the cervical spine. 3. Mild cortical volume loss and scattered small vessel ischemic microangiopathy. 4. Mild degenerative change along the cervical spine. 5. Mild partial opacification of the  left maxillary sinus. Electronically Signed   By: Garald Balding M.D.   On: 06/15/2016 05:55   Dg Hip Unilat W Or Wo Pelvis 2-3 Views Left  Result Date: 06/15/2016 CLINICAL DATA:  Status post unwitnessed fall, with left hip pain and deformity. Initial encounter. EXAM: DG HIP (WITH OR WITHOUT PELVIS) 2-3V LEFT COMPARISON:  None. FINDINGS: There is a comminuted left femoral intertrochanteric fracture, with mild displacement. The left femoral head remains seated at the acetabulum. The right hip arthroplasty is grossly unremarkable in appearance. No additional fractures are seen. The sacroiliac joints are within normal limits. Mild degenerative change is noted at the lower lumbar spine. Soft tissue swelling is noted about the left hip. IMPRESSION: Comminuted left femoral intertrochanteric fracture, with mild displacement. Electronically Signed   By: Garald Balding M.D.   On: 06/15/2016 06:21    Labs:  Recent Labs  06/15/16 0521 06/15/16 0534  WBC  --  8.1  RBC  --  3.04*  HCT 30.0* 27.1*  PLT  --  186    Recent Labs  06/15/16 0511 06/15/16 0521  NA 135 136  K 4.6 5.0  CL 104 102  CO2 22  --   BUN 30* 34*  CREATININE 2.09* 2.00*  GLUCOSE 492* 477*  CALCIUM 8.6*  --     Recent Labs  06/15/16 0511  INR 0.97    Review of Systems: ROS  Physical Exam: Neurologically intact ABD soft Sensation intact distally Compartment soft Pt able to move feel bilaterally Exquisite TTP of the left lateral hip LLE externally rotated  Assessment and Plan: Pt needs to remain NPO Consulted Dr Rolena Infante Surgical Intervention is being considered   Ronette Deter, [place] for Melina Schools, MD [place] [phone]    Discussed with my partner Dr Lyla Glassing Will transfer to [facility] today for surgery in the AM Patient aware of plan and in agreement

## 2016-06-16 ENCOUNTER — Inpatient Hospital Stay (HOSPITAL_COMMUNITY): Payer: Medicare HMO | Admitting: Anesthesiology

## 2016-06-16 ENCOUNTER — Encounter (HOSPITAL_COMMUNITY)
Admission: EM | Disposition: A | Discharge status: Skilled Nursing Facility | Payer: Self-pay | Source: Home / Self Care | Attending: Internal Medicine

## 2016-06-16 ENCOUNTER — Inpatient Hospital Stay (HOSPITAL_COMMUNITY): Payer: Medicare HMO

## 2016-06-16 ENCOUNTER — Encounter (HOSPITAL_COMMUNITY): Payer: Self-pay | Admitting: Certified Registered Nurse Anesthetist

## 2016-06-16 DIAGNOSIS — S72142A Displaced intertrochanteric fracture of left femur, initial encounter for closed fracture: Secondary | ICD-10-CM | POA: Diagnosis present

## 2016-06-16 DIAGNOSIS — N289 Disorder of kidney and ureter, unspecified: Secondary | ICD-10-CM

## 2016-06-16 DIAGNOSIS — S72002A Fracture of unspecified part of neck of left femur, initial encounter for closed fracture: Secondary | ICD-10-CM

## 2016-06-16 HISTORY — PX: INTRAMEDULLARY (IM) NAIL INTERTROCHANTERIC: SHX5875

## 2016-06-16 LAB — RETICULOCYTES
RBC.: 2.45 MIL/uL — ABNORMAL LOW (ref 4.22–5.81)
Retic Count, Absolute: 31.9 10*3/uL (ref 19.0–186.0)
Retic Ct Pct: 1.3 % (ref 0.4–3.1)

## 2016-06-16 LAB — GLUCOSE, CAPILLARY
Glucose-Capillary: 134 mg/dL — ABNORMAL HIGH (ref 65–99)
Glucose-Capillary: 135 mg/dL — ABNORMAL HIGH (ref 65–99)
Glucose-Capillary: 132 mg/dL — ABNORMAL HIGH (ref 65–99)
Glucose-Capillary: 242 mg/dL — ABNORMAL HIGH (ref 65–99)
Glucose-Capillary: 204 mg/dL — ABNORMAL HIGH (ref 65–99)

## 2016-06-16 LAB — IRON AND TIBC
Iron: 16 ug/dL — ABNORMAL LOW (ref 45–182)
Saturation Ratios: 7 % — ABNORMAL LOW (ref 17.9–39.5)
TIBC: 216 ug/dL — ABNORMAL LOW (ref 250–450)
UIBC: 200 ug/dL

## 2016-06-16 LAB — FERRITIN: Ferritin: 177 ng/mL (ref 24–336)

## 2016-06-16 LAB — VITAMIN B12: Vitamin B-12: 167 pg/mL — ABNORMAL LOW (ref 180–914)

## 2016-06-16 LAB — FOLATE: Folate: 31.4 ng/mL (ref >5.9)

## 2016-06-16 SURGERY — FIXATION, FRACTURE, INTERTROCHANTERIC, WITH INTRAMEDULLARY ROD
Anesthesia: Monitor Anesthesia Care | Laterality: Left

## 2016-06-16 MED ORDER — SENNA 8.6 MG PO TABS
1.0000 | ORAL_TABLET | Freq: Two times a day (BID) | ORAL | Status: DC
  Administered 2016-06-16 – 2016-06-21 (×10): 8.6 mg via ORAL
  Filled 2016-06-16 (×10): qty 1

## 2016-06-16 MED ORDER — HYDROCODONE-ACETAMINOPHEN 5-325 MG PO TABS
1.0000 | ORAL_TABLET | Freq: Four times a day (QID) | ORAL | Status: DC | PRN
  Administered 2016-06-16: 2 via ORAL
  Filled 2016-06-16: qty 2

## 2016-06-16 MED ORDER — EPHEDRINE 5 MG/ML INJ
INTRAVENOUS | Status: AC
  Filled 2016-06-16: qty 10

## 2016-06-16 MED ORDER — MORPHINE SULFATE (PF) 2 MG/ML IV SOLN
0.5000 mg | INTRAVENOUS | Status: DC | PRN
  Administered 2016-06-16 (×2): 0.5 mg via INTRAVENOUS
  Filled 2016-06-16: qty 1

## 2016-06-16 MED ORDER — OXYCODONE HCL 5 MG PO TABS
5.0000 mg | ORAL_TABLET | ORAL | Status: DC | PRN
  Administered 2016-06-16 – 2016-06-21 (×18): 5 mg via ORAL
  Filled 2016-06-16 (×19): qty 1

## 2016-06-16 MED ORDER — SODIUM CHLORIDE 0.9 % IV BOLUS (SEPSIS): 250.0000 mL | Freq: Once | INTRAVENOUS | Status: DC

## 2016-06-16 MED ORDER — LACTATED RINGERS IV SOLN
INTRAVENOUS | Status: DC | PRN
  Administered 2016-06-16: 10:00:00 via INTRAVENOUS

## 2016-06-16 MED ORDER — CEFAZOLIN SODIUM-DEXTROSE 2-4 GM/100ML-% IV SOLN
2.0000 g | Freq: Four times a day (QID) | INTRAVENOUS | Status: AC
  Administered 2016-06-16 (×2): 2 g via INTRAVENOUS
  Filled 2016-06-16 (×3): qty 100

## 2016-06-16 MED ORDER — SUCCINYLCHOLINE CHLORIDE 200 MG/10ML IV SOSY
PREFILLED_SYRINGE | INTRAVENOUS | Status: AC
  Filled 2016-06-16: qty 10

## 2016-06-16 MED ORDER — PHENOL 1.4 % MT LIQD: 1.0000 | OROMUCOSAL | Status: DC | PRN

## 2016-06-16 MED ORDER — MIDAZOLAM HCL 5 MG/5ML IJ SOLN
INTRAMUSCULAR | Status: DC | PRN
  Administered 2016-06-16: 1 mg via INTRAVENOUS

## 2016-06-16 MED ORDER — PROPOFOL 10 MG/ML IV BOLUS
INTRAVENOUS | Status: AC
  Filled 2016-06-16: qty 20

## 2016-06-16 MED ORDER — ASPIRIN EC 81 MG PO TBEC
81.0000 mg | DELAYED_RELEASE_TABLET | Freq: Two times a day (BID) | ORAL | Status: DC
  Administered 2016-06-17 – 2016-06-18 (×3): 81 mg via ORAL
  Filled 2016-06-16 (×4): qty 1

## 2016-06-16 MED ORDER — FENTANYL CITRATE (PF) 100 MCG/2ML IJ SOLN
INTRAMUSCULAR | Status: AC
  Filled 2016-06-16: qty 4

## 2016-06-16 MED ORDER — HYDROMORPHONE HCL 1 MG/ML IJ SOLN: 0.2500 mg | INTRAMUSCULAR | Status: DC | PRN

## 2016-06-16 MED ORDER — ASPIRIN EC 81 MG PO TBEC: 81.0000 mg | DELAYED_RELEASE_TABLET | Freq: Every day | ORAL | Status: DC

## 2016-06-16 MED ORDER — ACETAMINOPHEN 325 MG PO TABS
650.0000 mg | ORAL_TABLET | Freq: Four times a day (QID) | ORAL | Status: DC | PRN
  Administered 2016-06-16: 650 mg via ORAL
  Filled 2016-06-16: qty 2

## 2016-06-16 MED ORDER — CLOPIDOGREL BISULFATE 75 MG PO TABS
75.0000 mg | ORAL_TABLET | Freq: Every day | ORAL | Status: DC
  Administered 2016-06-17 – 2016-06-18 (×2): 75 mg via ORAL
  Filled 2016-06-16 (×3): qty 1

## 2016-06-16 MED ORDER — ONDANSETRON HCL 4 MG PO TABS: 4.0000 mg | ORAL_TABLET | Freq: Four times a day (QID) | ORAL | Status: DC | PRN

## 2016-06-16 MED ORDER — FLEET ENEMA 7-19 GM/118ML RE ENEM: 1.0000 | ENEMA | Freq: Once | RECTAL | Status: DC | PRN

## 2016-06-16 MED ORDER — 0.9 % SODIUM CHLORIDE (POUR BTL) OPTIME
TOPICAL | Status: DC | PRN
  Administered 2016-06-16: 1000 mL

## 2016-06-16 MED ORDER — ACETAMINOPHEN 650 MG RE SUPP: 650.0000 mg | Freq: Four times a day (QID) | RECTAL | Status: DC | PRN

## 2016-06-16 MED ORDER — PROPOFOL 500 MG/50ML IV EMUL
INTRAVENOUS | Status: DC | PRN
  Administered 2016-06-16: 50 ug/kg/min via INTRAVENOUS

## 2016-06-16 MED ORDER — MENTHOL 3 MG MT LOZG: 1.0000 | LOZENGE | OROMUCOSAL | Status: DC | PRN

## 2016-06-16 MED ORDER — DOCUSATE SODIUM 100 MG PO CAPS
100.0000 mg | ORAL_CAPSULE | Freq: Two times a day (BID) | ORAL | Status: DC
  Administered 2016-06-16 – 2016-06-21 (×11): 100 mg via ORAL
  Filled 2016-06-16 (×11): qty 1

## 2016-06-16 MED ORDER — POLYETHYLENE GLYCOL 3350 17 G PO PACK: 17.0000 g | PACK | Freq: Every day | ORAL | Status: DC | PRN

## 2016-06-16 MED ORDER — METOCLOPRAMIDE HCL 5 MG PO TABS: 5.0000 mg | ORAL_TABLET | Freq: Three times a day (TID) | ORAL | Status: DC | PRN

## 2016-06-16 MED ORDER — PROMETHAZINE HCL 25 MG/ML IJ SOLN: 6.2500 mg | INTRAMUSCULAR | Status: DC | PRN

## 2016-06-16 MED ORDER — INSULIN ASPART PROT & ASPART (70-30 MIX) 100 UNIT/ML ~~LOC~~ SUSP
20.0000 [IU] | Freq: Two times a day (BID) | SUBCUTANEOUS | Status: DC
  Administered 2016-06-16: 20 [IU] via SUBCUTANEOUS
  Filled 2016-06-16: qty 10

## 2016-06-16 MED ORDER — MIDAZOLAM HCL 2 MG/2ML IJ SOLN
INTRAMUSCULAR | Status: AC
  Filled 2016-06-16: qty 2

## 2016-06-16 MED ORDER — GABAPENTIN 300 MG PO CAPS: 300.0000 mg | ORAL_CAPSULE | Freq: Two times a day (BID) | ORAL | Status: DC

## 2016-06-16 MED ORDER — ONDANSETRON HCL 4 MG/2ML IJ SOLN
INTRAMUSCULAR | Status: AC
  Filled 2016-06-16: qty 2

## 2016-06-16 MED ORDER — HYDROMORPHONE HCL 1 MG/ML IJ SOLN
1.0000 mg | INTRAMUSCULAR | Status: DC | PRN
  Administered 2016-06-16 – 2016-06-18 (×4): 1 mg via INTRAVENOUS
  Filled 2016-06-16 (×4): qty 1

## 2016-06-16 MED ORDER — EPHEDRINE SULFATE-NACL 50-0.9 MG/10ML-% IV SOSY
PREFILLED_SYRINGE | INTRAVENOUS | Status: DC | PRN
  Administered 2016-06-16: 10 mg via INTRAVENOUS
  Administered 2016-06-16 (×2): 15 mg via INTRAVENOUS
  Administered 2016-06-16: 10 mg via INTRAVENOUS
  Administered 2016-06-16: 15 mg via INTRAVENOUS

## 2016-06-16 MED ORDER — INSULIN ASPART 100 UNIT/ML ~~LOC~~ SOLN
0.0000 [IU] | Freq: Three times a day (TID) | SUBCUTANEOUS | Status: DC
  Administered 2016-06-17: 7 [IU] via SUBCUTANEOUS
  Administered 2016-06-17: 8 [IU] via SUBCUTANEOUS
  Administered 2016-06-18: 3 [IU] via SUBCUTANEOUS

## 2016-06-16 MED ORDER — ONDANSETRON HCL 4 MG/2ML IJ SOLN: 4.0000 mg | Freq: Four times a day (QID) | INTRAMUSCULAR | Status: DC | PRN

## 2016-06-16 MED ORDER — METOCLOPRAMIDE HCL 5 MG/ML IJ SOLN: 5.0000 mg | Freq: Three times a day (TID) | INTRAMUSCULAR | Status: DC | PRN

## 2016-06-16 MED ORDER — AMLODIPINE BESYLATE 10 MG PO TABS
10.0000 mg | ORAL_TABLET | Freq: Every day | ORAL | Status: DC
  Administered 2016-06-16 – 2016-06-21 (×6): 10 mg via ORAL
  Filled 2016-06-16 (×6): qty 1

## 2016-06-16 MED ORDER — BUPIVACAINE IN DEXTROSE 0.75-8.25 % IT SOLN
INTRATHECAL | Status: DC | PRN
Start: 2016-06-16 — End: 2016-06-16
  Administered 2016-06-16: 12 mg via INTRATHECAL

## 2016-06-16 MED ORDER — OXYCODONE-ACETAMINOPHEN 5-325 MG PO TABS
1.0000 | ORAL_TABLET | ORAL | Status: DC | PRN
  Administered 2016-06-16 – 2016-06-21 (×19): 1 via ORAL
  Filled 2016-06-16 (×19): qty 1

## 2016-06-16 MED ORDER — ALPRAZOLAM 0.5 MG PO TABS
0.5000 mg | ORAL_TABLET | Freq: Two times a day (BID) | ORAL | Status: DC | PRN
Start: 2016-06-16 — End: 2016-06-16

## 2016-06-16 MED ORDER — PHENYLEPHRINE HCL 10 MG/ML IJ SOLN
INTRAMUSCULAR | Status: DC | PRN
  Administered 2016-06-16: 40 ug/min via INTRAVENOUS

## 2016-06-16 MED ORDER — PROPOFOL 10 MG/ML IV BOLUS
INTRAVENOUS | Status: DC | PRN
  Administered 2016-06-16: 40 mg via INTRAVENOUS

## 2016-06-16 SURGICAL SUPPLY — 47 items
ALCOHOL ISOPROPYL (RUBBING) (MISCELLANEOUS) ×2 IMPLANT
BIT DRILL 4.2 (DRILL) ×1 IMPLANT
BNDG COHESIVE 4X5 TAN STRL (GAUZE/BANDAGES/DRESSINGS) IMPLANT
CHLORAPREP W/TINT 26ML (MISCELLANEOUS) ×2 IMPLANT
COVER PERINEAL POST (MISCELLANEOUS) ×2 IMPLANT
COVER SURGICAL LIGHT HANDLE (MISCELLANEOUS) ×2 IMPLANT
DERMABOND ADVANCED (GAUZE/BANDAGES/DRESSINGS) ×1
DERMABOND ADVANCED .7 DNX12 (GAUZE/BANDAGES/DRESSINGS) ×1 IMPLANT
DRAPE C-ARM 42X72 X-RAY (DRAPES) ×2 IMPLANT
DRAPE C-ARMOR (DRAPES) ×2 IMPLANT
DRAPE IMP U-DRAPE 54X76 (DRAPES) ×4 IMPLANT
DRAPE PROXIMA HALF (DRAPES) ×2 IMPLANT
DRAPE STERI IOBAN 125X83 (DRAPES) ×2 IMPLANT
DRAPE U-SHAPE 47X51 STRL (DRAPES) ×4 IMPLANT
DRAPE UNIVERSAL PACK (DRAPES) ×2 IMPLANT
DRILL 4.2 (DRILL) ×2
DRSG MEPILEX BORDER 4X4 (GAUZE/BANDAGES/DRESSINGS) ×2 IMPLANT
DRSG PAD ABDOMINAL 8X10 ST (GAUZE/BANDAGES/DRESSINGS) IMPLANT
ELECT REM PT RETURN 9FT ADLT (ELECTROSURGICAL) ×2
ELECTRODE REM PT RTRN 9FT ADLT (ELECTROSURGICAL) ×1 IMPLANT
FACESHIELD WRAPAROUND (MASK) ×2 IMPLANT
GLOVE BIO SURGEON STRL SZ8.5 (GLOVE) ×4 IMPLANT
GLOVE BIOGEL PI IND STRL 8.5 (GLOVE) ×1 IMPLANT
GLOVE BIOGEL PI INDICATOR 8.5 (GLOVE) ×1
GOWN STRL REUS W/ TWL LRG LVL3 (GOWN DISPOSABLE) ×2 IMPLANT
GOWN STRL REUS W/TWL 2XL LVL3 (GOWN DISPOSABLE) ×2 IMPLANT
GOWN STRL REUS W/TWL LRG LVL3 (GOWN DISPOSABLE) ×2
GUIDEWIRE 3.2X400 (WIRE) ×4 IMPLANT
KIT ROOM TURNOVER OR (KITS) ×2 IMPLANT
MANIFOLD NEPTUNE II (INSTRUMENTS) ×2 IMPLANT
MARKER SKIN DUAL TIP RULER LAB (MISCELLANEOUS) ×2 IMPLANT
NAIL TI CANN TFNA 10MM 130DEG (Nail) ×2 IMPLANT
NS IRRIG 1000ML POUR BTL (IV SOLUTION) ×2 IMPLANT
PACK GENERAL/GYN (CUSTOM PROCEDURE TRAY) ×2 IMPLANT
PAD ARMBOARD 7.5X6 YLW CONV (MISCELLANEOUS) ×4 IMPLANT
PADDING CAST ABS 4INX4YD NS (CAST SUPPLIES)
PADDING CAST ABS COTTON 4X4 ST (CAST SUPPLIES) IMPLANT
REAMER ROD DEEP FLUTE 2.5X950 (INSTRUMENTS) ×2 IMPLANT
SCREW LOCKING 5.0MMX44MM (Screw) ×2 IMPLANT
SCREW TFNA P5MM STERILE (Screw) ×2 IMPLANT
SUT MNCRL AB 3-0 PS2 27 (SUTURE) ×2 IMPLANT
SUT MON AB 2-0 CT1 27 (SUTURE) ×2 IMPLANT
SUT MON AB 2-0 CT1 36 (SUTURE) ×2 IMPLANT
SUT VIC AB 1 CT1 27 (SUTURE) ×1
SUT VIC AB 1 CT1 27XBRD ANBCTR (SUTURE) ×1 IMPLANT
TOWEL OR 17X24 6PK STRL BLUE (TOWEL DISPOSABLE) ×2 IMPLANT
TOWEL OR 17X26 10 PK STRL BLUE (TOWEL DISPOSABLE) ×2 IMPLANT

## 2016-06-16 NOTE — Brief Op Note (Signed)
06/15/2016 - 06/16/2016  11:59 AM  PATIENT:  [name] [name]  70 y.o. male  PRE-OPERATIVE DIAGNOSIS:  left intertrochanter fracture  POST-OPERATIVE DIAGNOSIS:  left intertrochanter fracture  PROCEDURE:  Procedure(s): INTRAMEDULLARY (IM) NAIL INTERTROCHANTRIC (Left)  SURGEON:  Surgeon(s) and Role:    * Rod Can, MD - Primary  PHYSICIAN ASSISTANT: Ardeen Jourdain, PA-C  ASSISTANTS: none   ANESTHESIA:   spinal  EBL:  Total I/O In: 0  Out: 250 [Blood:250]  BLOOD ADMINISTERED:none  DRAINS: none   LOCAL MEDICATIONS USED:  NONE  SPECIMEN:  No Specimen  DISPOSITION OF SPECIMEN:  N/A  COUNTS:  YES  TOURNIQUET:  * No tourniquets in log *  DICTATION: .Other Dictation: Dictation Number [id number]  PLAN OF CARE: Admit to inpatient   PATIENT DISPOSITION:  PACU - hemodynamically stable.   Delay start of Pharmacological VTE agent (>24hrs) due to surgical blood loss or risk of bleeding: yes

## 2016-06-16 NOTE — Transfer of Care (Signed)
Immediate Anesthesia Transfer of Care Note  Patient: [name] [name]  Procedure(s) Performed: Procedure(s): INTRAMEDULLARY (IM) NAIL INTERTROCHANTRIC (Left)  Patient Location: PACU  Anesthesia Type:Spinal  Level of Consciousness: awake, alert , oriented and patient cooperative  Airway & Oxygen Therapy: Patient Spontanous Breathing and Patient connected to nasal cannula oxygen  Post-op Assessment: Report given to RN and Post -op Vital signs reviewed and stable  Post vital signs: Reviewed and stable  Last Vitals:  Vitals:   06/16/16 1222 06/16/16 1232  BP: (!) 83/60 102/65  Pulse: 71 65  Resp: 16 (!) 22  Temp:      Last Pain:  Vitals:   06/16/16 0910  TempSrc:   PainSc: 2       Patients Stated Pain Goal: 2 ([id number])  Complications: No apparent anesthesia complications

## 2016-06-16 NOTE — Progress Notes (Signed)
Paged on-call for Dr. Lyla Glassing regarding pain management. Awaiting call back.

## 2016-06-16 NOTE — Progress Notes (Signed)
Text paged [facility] regarding pt's CBGs. Pt post op and eating diet. Ok to change insulins and CBGs to ACHS?

## 2016-06-16 NOTE — Progress Notes (Signed)
Report called to OR  

## 2016-06-16 NOTE — Anesthesia Postprocedure Evaluation (Signed)
Anesthesia Post Note  Patient: Karmel Rehfeldt  Procedure(s) Performed: Procedure(s) (LRB): INTRAMEDULLARY (IM) NAIL INTERTROCHANTRIC (Left)  Patient location during evaluation: PACU Anesthesia Type: Spinal Level of consciousness: oriented and awake and alert Pain management: pain level controlled Vital Signs Assessment: post-procedure vital signs reviewed and stable Respiratory status: spontaneous breathing, respiratory function stable and patient connected to nasal cannula oxygen Cardiovascular status: blood pressure returned to baseline and stable Postop Assessment: no headache and no backache Anesthetic complications: no    Last Vitals:  Vitals:   06/16/16 1305 06/16/16 1320  BP: 124/61 131/64  Pulse: 60 (!) 57  Resp: 19 19  Temp:      Last Pain:  Vitals:   06/16/16 0910  TempSrc:   PainSc: 2             L Sensory Level: L2-Upper inner thigh, upper buttock (06/16/16 1320) R Sensory Level: L2-Upper inner thigh, upper buttock (06/16/16 1320)  [place]

## 2016-06-16 NOTE — Anesthesia Procedure Notes (Signed)
Spinal  Patient location during procedure: OR Start time: 06/16/2016 10:31 AM End time: 06/16/2016 10:41 AM Staffing Anesthesiologist: Duane Boston Performed: anesthesiologist  Preanesthetic Checklist Completed: patient identified, surgical consent, pre-op evaluation, timeout performed, IV checked, risks and benefits discussed and monitors and equipment checked Spinal Block Patient position: left lateral decubitus Prep: DuraPrep Patient monitoring: cardiac monitor, continuous pulse ox and blood pressure Approach: midline Location: L2-3 Injection technique: single-shot Needle Needle type: Pencan  Needle gauge: 24 G Needle length: 9 cm Additional Notes Functioning IV was confirmed and monitors were applied. Sterile prep and drape, including hand hygiene and sterile gloves were used. The patient was positioned and the spine was prepped. The skin was anesthetized with lidocaine.  Free flow of clear CSF was obtained prior to injecting local anesthetic into the CSF.  The spinal needle aspirated freely following injection.  The needle was carefully withdrawn.  The patient tolerated the procedure well.

## 2016-06-16 NOTE — H&P (View-Only) (Signed)
Patient ID: [name] [name], male   DOB: [birth year], 70 y.o.   MRN: [id number]    Patient ID: [name] [name] MRN: [id number] DOB/AGE: 12-12-1945 70 y.o.  Admit date: 06/15/2016  Admission Diagnoses:  Active Problems:   Closed left subtrochanteric femur fracture ([name])   HPI: Pleasant 70 year old pt who sustained a trama last eveing around 6:30 pm.  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 The pt reports tripping over a rug in his bedroom.  He reports hitting his head.  He said his right hip used to come out of place before his replacement and if he rolled on it it will slip back in place.  He said he thought that is what was wrong with his left hip.  He realized his injury was more severe and presented to the ED.  Past Medical History: Past Medical History:  Diagnosis Date  . Diabetes mellitus without complication ([place])   . Hypertension     Surgical History: Past Surgical History:  Procedure Laterality Date  . HIP ARTHROPLASTY Right 02/05/2013   Procedure: ARTHROPLASTY BIPOLAR HIP;  Surgeon: [name], MD;  Location: [facility];  Service: Orthopedics;  Laterality: Right;    Family History: Family History  Problem Relation Age of Onset  . Diabetes Mother   . Alzheimer's disease Mother   . Hypertension Mother   . Hyperlipidemia Father   . Hypertension Father   . Healthy Maternal Grandmother   . Pneumonia Maternal Grandfather     Social History: Social History   Social History  . Marital status: Single    Spouse name: N/A  . Number of children: 2  . Years of education: 13   Occupational History  . Retired    Social History Main Topics  . Smoking status: Former Smoker    Packs/day: 0.25    Years: 30.00    Types: Cigarettes    Quit date: 01/31/2013  . Smokeless tobacco: Never Used  . Alcohol use No  . Drug use: No  . Sexual activity: No   Other Topics Concern  . Not on file   Social History Narrative   Fun: Likes to do handyman related projects,  photography.    Denies religious beliefs effecting health care.     Allergies: Review of patient's allergies indicates no known allergies.  Medications: I have reviewed the patient's current medications.  Vital Signs: Patient Vitals for the past 24 hrs:  BP Temp Temp src Pulse Resp SpO2  06/15/16 0646 162/82 97.7 F (36.5 C) Oral 81 18 98 %  06/15/16 0602 166/74 97.9 F (36.6 C) Oral 80 20 94 %  06/15/16 0450 151/72 98.3 F (36.8 C) Oral 79 20 92 %    Radiology: Dg Chest 1 View  Result Date: 06/15/2016 CLINICAL DATA:  Acute onset of left hip pain and deformity after fall. Initial encounter. EXAM: CHEST 1 VIEW COMPARISON:  Chest radiograph performed 05/15/2016 FINDINGS: The lungs are well-aerated and clear. There is no evidence of focal opacification, pleural effusion or pneumothorax. The cardiomediastinal silhouette is within normal limits. No acute osseous abnormalities are seen. IMPRESSION: No acute cardiopulmonary process seen. No displaced rib fractures identified. Electronically Signed   By: Garald Balding M.D.   On: 06/15/2016 06:20   Ct Head Wo Contrast  Result Date: 06/15/2016 CLINICAL DATA:  Status post fall, hitting head. Concern for head or cervical spine injury. Initial encounter. EXAM: CT HEAD WITHOUT CONTRAST CT CERVICAL SPINE WITHOUT CONTRAST TECHNIQUE: Multidetector  CT imaging of the head and cervical spine was performed following the standard protocol without intravenous contrast. Multiplanar CT image reconstructions of the cervical spine were also generated. COMPARISON:  CT of the head performed 05/15/2016, and CT of the cervical spine performed 08/07/2015. MRI of the brain performed 07/07/2015 FINDINGS: CT HEAD FINDINGS There is no evidence of acute infarction, mass lesion, or intra- or extra-axial hemorrhage on CT. Prominence of the ventricles and sulci reflects mild cortical volume loss. Mild cerebellar atrophy is noted. Scattered periventricular and subcortical white  matter change likely reflects small vessel ischemic microangiopathy. The brainstem and fourth ventricle are within normal limits. The basal ganglia are unremarkable in appearance. The cerebral hemispheres demonstrate grossly normal gray-white differentiation. No mass effect or midline shift is seen. There is no evidence of fracture; visualized osseous structures are unremarkable in appearance. The visualized portions of the orbits are within normal limits. There is mild partial opacification of the left maxillary sinus. The remaining paranasal sinuses and mastoid air cells are well-aerated. No significant soft tissue abnormalities are seen. CT CERVICAL SPINE FINDINGS There is no evidence of fracture or subluxation. Vertebral bodies demonstrate normal height and alignment. Multilevel disc space narrowing and endplate sclerotic change are noted along the cervical spine, with anterior and posterior disc osteophyte complexes. Prevertebral soft tissues are within normal limits. The thyroid gland is unremarkable in appearance. The visualized lung apices are clear. Minimal calcification is seen at the carotid bifurcations bilaterally. IMPRESSION: 1. No evidence of traumatic intracranial injury or fracture. 2. No evidence of fracture or subluxation along the cervical spine. 3. Mild cortical volume loss and scattered small vessel ischemic microangiopathy. 4. Mild degenerative change along the cervical spine. 5. Mild partial opacification of the left maxillary sinus. Electronically Signed   By: Garald Balding M.D.   On: 06/15/2016 05:55   Ct Cervical Spine Wo Contrast  Result Date: 06/15/2016 CLINICAL DATA:  Status post fall, hitting head. Concern for head or cervical spine injury. Initial encounter. EXAM: CT HEAD WITHOUT CONTRAST CT CERVICAL SPINE WITHOUT CONTRAST TECHNIQUE: Multidetector CT imaging of the head and cervical spine was performed following the standard protocol without intravenous contrast. Multiplanar CT  image reconstructions of the cervical spine were also generated. COMPARISON:  CT of the head performed 05/15/2016, and CT of the cervical spine performed 08/07/2015. MRI of the brain performed 07/07/2015 FINDINGS: CT HEAD FINDINGS There is no evidence of acute infarction, mass lesion, or intra- or extra-axial hemorrhage on CT. Prominence of the ventricles and sulci reflects mild cortical volume loss. Mild cerebellar atrophy is noted. Scattered periventricular and subcortical white matter change likely reflects small vessel ischemic microangiopathy. The brainstem and fourth ventricle are within normal limits. The basal ganglia are unremarkable in appearance. The cerebral hemispheres demonstrate grossly normal gray-white differentiation. No mass effect or midline shift is seen. There is no evidence of fracture; visualized osseous structures are unremarkable in appearance. The visualized portions of the orbits are within normal limits. There is mild partial opacification of the left maxillary sinus. The remaining paranasal sinuses and mastoid air cells are well-aerated. No significant soft tissue abnormalities are seen. CT CERVICAL SPINE FINDINGS There is no evidence of fracture or subluxation. Vertebral bodies demonstrate normal height and alignment. Multilevel disc space narrowing and endplate sclerotic change are noted along the cervical spine, with anterior and posterior disc osteophyte complexes. Prevertebral soft tissues are within normal limits. The thyroid gland is unremarkable in appearance. The visualized lung apices are clear. Minimal calcification is seen  at the carotid bifurcations bilaterally. IMPRESSION: 1. No evidence of traumatic intracranial injury or fracture. 2. No evidence of fracture or subluxation along the cervical spine. 3. Mild cortical volume loss and scattered small vessel ischemic microangiopathy. 4. Mild degenerative change along the cervical spine. 5. Mild partial opacification of the  left maxillary sinus. Electronically Signed   By: Garald Balding M.D.   On: 06/15/2016 05:55   Dg Hip Unilat W Or Wo Pelvis 2-3 Views Left  Result Date: 06/15/2016 CLINICAL DATA:  Status post unwitnessed fall, with left hip pain and deformity. Initial encounter. EXAM: DG HIP (WITH OR WITHOUT PELVIS) 2-3V LEFT COMPARISON:  None. FINDINGS: There is a comminuted left femoral intertrochanteric fracture, with mild displacement. The left femoral head remains seated at the acetabulum. The right hip arthroplasty is grossly unremarkable in appearance. No additional fractures are seen. The sacroiliac joints are within normal limits. Mild degenerative change is noted at the lower lumbar spine. Soft tissue swelling is noted about the left hip. IMPRESSION: Comminuted left femoral intertrochanteric fracture, with mild displacement. Electronically Signed   By: Garald Balding M.D.   On: 06/15/2016 06:21    Labs:  Recent Labs  06/15/16 0521 06/15/16 0534  WBC  --  8.1  RBC  --  3.04*  HCT 30.0* 27.1*  PLT  --  186    Recent Labs  06/15/16 0511 06/15/16 0521  NA 135 136  K 4.6 5.0  CL 104 102  CO2 22  --   BUN 30* 34*  CREATININE 2.09* 2.00*  GLUCOSE 492* 477*  CALCIUM 8.6*  --     Recent Labs  06/15/16 0511  INR 0.97    Review of Systems: ROS  Physical Exam: Neurologically intact ABD soft Sensation intact distally Compartment soft Pt able to move feel bilaterally Exquisite TTP of the left lateral hip LLE externally rotated  Assessment and Plan: Pt needs to remain NPO Consulted Dr Rolena Infante Surgical Intervention is being considered   Ronette Deter, [place] for Melina Schools, MD [place] [phone]    Discussed with my partner Dr Lyla Glassing Will transfer to [facility] today for surgery in the AM Patient aware of plan and in agreement

## 2016-06-16 NOTE — Progress Notes (Signed)
[facility] PROGRESS NOTE  [name] [name] [id number] DOB: Aug 23, 1946 DOA: 06/15/2016   PCP: Mauricio Po, [name]     Assessment/Plan: Active Problems:   Closed left subtrochanteric femur fracture ([place])   Closed comminuted intertrochanteric fracture of left femur ([place])   Renal insufficiency  70 y.o.malewith medical history significant for insulin-dependent diabetes mellitus, hypertension, hyperlipidemia, ischemic CVA in 2016, and chronic kidney disease stage III who presentedto the emergency department after a fall. CBG was 517, when checked by EMS. Blood pressure 140/70  Assessment and plan 1. Left femoral intratrochanteric fracture. We'll keep patient nothing by mouth, will continue supportive medical therapy with IV morphine as needed for pain. DVT prophylaxis with mechanical compression devices. Patient anticipated to have surgery today by Dr. Lyla Glassing. Patient seems to be functional at home able to do at least 4 metabolic equivalents, no active cardiovascular disease. From the cardiovascular perspective since to be low to intermediate risk for a intermediate risk procedure. Physical therapy evaluation after procedure.   2. Hypertension. Will resume patient's antihypertensive agents with carvedilol 6.25 g twice daily and ramipril 10 mg daily. At home on dual antiplatelet therapy. We'll hold amlodipine for now.   3. Chronic kidney disease stage III. Baseline creatinine around 2.0. Will continue hydration with normal saline at 75 mL per hour. Potassium is 5.0. Will follow kidney function in the morning, avoid hypotension or nephrotoxic agents.  4. T2 DM. Presented with severe hypoglycemia, recent history of diabetic ketoacidosis, At home patient is on insulin 70/30 24 units twice daily, continue Accu-Cheks. Depending on serum glucose will adjust basal regimen of long-acting insulin.  .  Patient will be kept nothing by mouth for now for eventual surgical procedure. He has been  admitted to the hospital in late July and early August for uncontrolled diabetes including diabetic ketoacidosis.  5. Dyslipidemia continue atorvastatin.  6. Ischemic CVA. Will continue aspirin for now, hold Plavix for possible surgical procedure, they should be resumed when safe after surgery. Continue statin. Resume Plavix as soon as possible after procedure  7. Anemia of chronic disease-patient had a hemoglobin of 13.9 on 05/15/16, hemoglobin now 8.9, has slowly drifted down, check anemia panel, drops further may need CT to rule out retroperitoneal bleed     DVT prophylaxsis Lovenox  Code Status:  Full code    Family Communication: Discussed in detail with the patient, all imaging results, lab results explained to the patient   Disposition Plan:   Anticipated to have surgery today, may need placement     Consultants:  Orthopedics  Procedures:  None  Antibiotics: Anti-infectives    Start     Dose/Rate Route Frequency Ordered Stop   06/16/16 1300  ceFAZolin (ANCEF) IVPB 2g/100 mL premix     2 g 200 mL/hr over 30 Minutes Intravenous To [facility] 06/15/16 1813 06/17/16 1300         HPI/Subjective:  hypotensive post op  Objective: Vitals:   06/15/16 2055 06/16/16 0426 06/16/16 0624 06/16/16 0910  BP: 140/74 (!) 149/70  (!) 145/70  Pulse: 71 77  72  Resp: 16 16    Temp: 99 F (37.2 C) 100 F (37.8 C) 98.8 F (37.1 C)   TempSrc: Oral Oral Oral   SpO2: 99% 96%      Intake/Output Summary (Last 24 hours) at 06/16/16 0937 Last data filed at 06/16/16 0624  Gross per 24 hour  Intake  1115 ml  Output             1775 ml  Net             -660 ml    Exam:  Examination:  General exam: Appears calm and comfortable  Respiratory system: Clear to auscultation. Respiratory effort normal. Cardiovascular system: S1 & S2 heard, RRR. No JVD, murmurs, rubs, gallops or clicks. No pedal edema. Gastrointestinal system: Abdomen is nondistended, soft  and nontender. No organomegaly or masses felt. Normal bowel sounds heard. Central nervous system: Alert and oriented. No focal neurological deficits. Extremities: Symmetric 5 x 5 power. Skin: No rashes, lesions or ulcers Psychiatry: Judgement and insight appear normal. Mood & affect appropriate.     Data Reviewed: I have personally reviewed following labs and imaging studies  Micro Results Recent Results (from the past 240 hour(s))  Surgical pcr screen     Status: Abnormal   Collection Time: 06/15/16  7:14 PM  Result Value Ref Range Status   MRSA, PCR NEGATIVE NEGATIVE Final   Staphylococcus aureus POSITIVE (A) NEGATIVE Final    Comment:        The Xpert SA Assay (FDA approved for NASAL specimens in patients over 81 years of age), is one component of a comprehensive surveillance program.  Test performance has been validated by [facility] for patients greater than or equal to 35 year old. It is not intended to diagnose infection nor to guide or monitor treatment.     Radiology Reports Dg Chest 1 View  Result Date: 06/15/2016 CLINICAL DATA:  Acute onset of left hip pain and deformity after fall. Initial encounter. EXAM: CHEST 1 VIEW COMPARISON:  Chest radiograph performed 05/15/2016 FINDINGS: The lungs are well-aerated and clear. There is no evidence of focal opacification, pleural effusion or pneumothorax. The cardiomediastinal silhouette is within normal limits. No acute osseous abnormalities are seen. IMPRESSION: No acute cardiopulmonary process seen. No displaced rib fractures identified. Electronically Signed   By: Garald Balding M.D.   On: 06/15/2016 06:20   Ct Head Wo Contrast  Result Date: 06/15/2016 CLINICAL DATA:  Status post fall, hitting head. Concern for head or cervical spine injury. Initial encounter. EXAM: CT HEAD WITHOUT CONTRAST CT CERVICAL SPINE WITHOUT CONTRAST TECHNIQUE: Multidetector CT imaging of the head and cervical spine was performed following the  standard protocol without intravenous contrast. Multiplanar CT image reconstructions of the cervical spine were also generated. COMPARISON:  CT of the head performed 05/15/2016, and CT of the cervical spine performed 08/07/2015. MRI of the brain performed 07/07/2015 FINDINGS: CT HEAD FINDINGS There is no evidence of acute infarction, mass lesion, or intra- or extra-axial hemorrhage on CT. Prominence of the ventricles and sulci reflects mild cortical volume loss. Mild cerebellar atrophy is noted. Scattered periventricular and subcortical white matter change likely reflects small vessel ischemic microangiopathy. The brainstem and fourth ventricle are within normal limits. The basal ganglia are unremarkable in appearance. The cerebral hemispheres demonstrate grossly normal gray-white differentiation. No mass effect or midline shift is seen. There is no evidence of fracture; visualized osseous structures are unremarkable in appearance. The visualized portions of the orbits are within normal limits. There is mild partial opacification of the left maxillary sinus. The remaining paranasal sinuses and mastoid air cells are well-aerated. No significant soft tissue abnormalities are seen. CT CERVICAL SPINE FINDINGS There is no evidence of fracture or subluxation. Vertebral bodies demonstrate normal height and alignment. Multilevel disc space narrowing and endplate sclerotic change are noted along  the cervical spine, with anterior and posterior disc osteophyte complexes. Prevertebral soft tissues are within normal limits. The thyroid gland is unremarkable in appearance. The visualized lung apices are clear. Minimal calcification is seen at the carotid bifurcations bilaterally. IMPRESSION: 1. No evidence of traumatic intracranial injury or fracture. 2. No evidence of fracture or subluxation along the cervical spine. 3. Mild cortical volume loss and scattered small vessel ischemic microangiopathy. 4. Mild degenerative change  along the cervical spine. 5. Mild partial opacification of the left maxillary sinus. Electronically Signed   By: Garald Balding M.D.   On: 06/15/2016 05:55   Ct Cervical Spine Wo Contrast  Result Date: 06/15/2016 CLINICAL DATA:  Status post fall, hitting head. Concern for head or cervical spine injury. Initial encounter. EXAM: CT HEAD WITHOUT CONTRAST CT CERVICAL SPINE WITHOUT CONTRAST TECHNIQUE: Multidetector CT imaging of the head and cervical spine was performed following the standard protocol without intravenous contrast. Multiplanar CT image reconstructions of the cervical spine were also generated. COMPARISON:  CT of the head performed 05/15/2016, and CT of the cervical spine performed 08/07/2015. MRI of the brain performed 07/07/2015 FINDINGS: CT HEAD FINDINGS There is no evidence of acute infarction, mass lesion, or intra- or extra-axial hemorrhage on CT. Prominence of the ventricles and sulci reflects mild cortical volume loss. Mild cerebellar atrophy is noted. Scattered periventricular and subcortical white matter change likely reflects small vessel ischemic microangiopathy. The brainstem and fourth ventricle are within normal limits. The basal ganglia are unremarkable in appearance. The cerebral hemispheres demonstrate grossly normal gray-white differentiation. No mass effect or midline shift is seen. There is no evidence of fracture; visualized osseous structures are unremarkable in appearance. The visualized portions of the orbits are within normal limits. There is mild partial opacification of the left maxillary sinus. The remaining paranasal sinuses and mastoid air cells are well-aerated. No significant soft tissue abnormalities are seen. CT CERVICAL SPINE FINDINGS There is no evidence of fracture or subluxation. Vertebral bodies demonstrate normal height and alignment. Multilevel disc space narrowing and endplate sclerotic change are noted along the cervical spine, with anterior and posterior disc  osteophyte complexes. Prevertebral soft tissues are within normal limits. The thyroid gland is unremarkable in appearance. The visualized lung apices are clear. Minimal calcification is seen at the carotid bifurcations bilaterally. IMPRESSION: 1. No evidence of traumatic intracranial injury or fracture. 2. No evidence of fracture or subluxation along the cervical spine. 3. Mild cortical volume loss and scattered small vessel ischemic microangiopathy. 4. Mild degenerative change along the cervical spine. 5. Mild partial opacification of the left maxillary sinus. Electronically Signed   By: Garald Balding M.D.   On: 06/15/2016 05:55   Dg Knee Left Port  Result Date: 06/15/2016 CLINICAL DATA:  Left knee pain today. No known injury. Initial encounter. EXAM: PORTABLE LEFT KNEE - 1-2 VIEW COMPARISON:  None. FINDINGS: No evidence of fracture, dislocation, or joint effusion. No evidence of arthropathy or other focal bone abnormality. Soft tissues are unremarkable. IMPRESSION: Negative exam. Electronically Signed   By: Inge Rise M.D.   On: 06/15/2016 16:42   Dg Hip Unilat W Or Wo Pelvis 2-3 Views Left  Result Date: 06/15/2016 CLINICAL DATA:  Status post unwitnessed fall, with left hip pain and deformity. Initial encounter. EXAM: DG HIP (WITH OR WITHOUT PELVIS) 2-3V LEFT COMPARISON:  None. FINDINGS: There is a comminuted left femoral intertrochanteric fracture, with mild displacement. The left femoral head remains seated at the acetabulum. The right hip arthroplasty is grossly unremarkable in  appearance. No additional fractures are seen. The sacroiliac joints are within normal limits. Mild degenerative change is noted at the lower lumbar spine. Soft tissue swelling is noted about the left hip. IMPRESSION: Comminuted left femoral intertrochanteric fracture, with mild displacement. Electronically Signed   By: Garald Balding M.D.   On: 06/15/2016 06:21     CBC  Recent Labs Lab 06/15/16 0521 06/15/16 0534   WBC  --  8.1  HGB 10.2* 8.9*  HCT 30.0* 27.1*  PLT  --  186  MCV  --  89.1  MCH  --  29.3  MCHC  --  32.8  RDW  --  14.1  LYMPHSABS  --  1.0  MONOABS  --  1.0  EOSABS  --  0.1  BASOSABS  --  0.0    Chemistries   Recent Labs Lab 06/15/16 0511 06/15/16 0521  NA 135 136  K 4.6 5.0  CL 104 102  CO2 22  --   GLUCOSE 492* 477*  BUN 30* 34*  CREATININE 2.09* 2.00*  CALCIUM 8.6*  --   AST 19  --   ALT 13*  --   ALKPHOS 70  --   BILITOT 1.0  --    ------------------------------------------------------------------------------------------------------------------ CrCl cannot be calculated (Unknown ideal weight.). ------------------------------------------------------------------------------------------------------------------ No results for input(s): HGBA1C in the last 72 hours. ------------------------------------------------------------------------------------------------------------------ No results for input(s): CHOL, HDL, LDLCALC, TRIG, CHOLHDL, LDLDIRECT in the last 72 hours. ------------------------------------------------------------------------------------------------------------------ No results for input(s): TSH, T4TOTAL, T3FREE, THYROIDAB in the last 72 hours.  Invalid input(s): FREET3 ------------------------------------------------------------------------------------------------------------------ No results for input(s): VITAMINB12, FOLATE, FERRITIN, TIBC, IRON, RETICCTPCT in the last 72 hours.  Coagulation profile  Recent Labs Lab 06/15/16 0511  INR 0.97    No results for input(s): DDIMER in the last 72 hours.  Cardiac Enzymes No results for input(s): CKMB, TROPONINI, MYOGLOBIN in the last 168 hours.  Invalid input(s): CK ------------------------------------------------------------------------------------------------------------------ Invalid input(s): POCBNP   CBG:  Recent Labs Lab 06/15/16 1353 06/15/16 1710 06/15/16 2317 06/16/16 0430  06/16/16 0859  GLUCAP 337* 219* 231* 134* 135*       Studies: Dg Chest 1 View  Result Date: 06/15/2016 CLINICAL DATA:  Acute onset of left hip pain and deformity after fall. Initial encounter. EXAM: CHEST 1 VIEW COMPARISON:  Chest radiograph performed 05/15/2016 FINDINGS: The lungs are well-aerated and clear. There is no evidence of focal opacification, pleural effusion or pneumothorax. The cardiomediastinal silhouette is within normal limits. No acute osseous abnormalities are seen. IMPRESSION: No acute cardiopulmonary process seen. No displaced rib fractures identified. Electronically Signed   By: Garald Balding M.D.   On: 06/15/2016 06:20   Ct Head Wo Contrast  Result Date: 06/15/2016 CLINICAL DATA:  Status post fall, hitting head. Concern for head or cervical spine injury. Initial encounter. EXAM: CT HEAD WITHOUT CONTRAST CT CERVICAL SPINE WITHOUT CONTRAST TECHNIQUE: Multidetector CT imaging of the head and cervical spine was performed following the standard protocol without intravenous contrast. Multiplanar CT image reconstructions of the cervical spine were also generated. COMPARISON:  CT of the head performed 05/15/2016, and CT of the cervical spine performed 08/07/2015. MRI of the brain performed 07/07/2015 FINDINGS: CT HEAD FINDINGS There is no evidence of acute infarction, mass lesion, or intra- or extra-axial hemorrhage on CT. Prominence of the ventricles and sulci reflects mild cortical volume loss. Mild cerebellar atrophy is noted. Scattered periventricular and subcortical white matter change likely reflects small vessel ischemic microangiopathy. The brainstem and fourth ventricle are within normal limits. The basal ganglia  are unremarkable in appearance. The cerebral hemispheres demonstrate grossly normal gray-white differentiation. No mass effect or midline shift is seen. There is no evidence of fracture; visualized osseous structures are unremarkable in appearance. The visualized  portions of the orbits are within normal limits. There is mild partial opacification of the left maxillary sinus. The remaining paranasal sinuses and mastoid air cells are well-aerated. No significant soft tissue abnormalities are seen. CT CERVICAL SPINE FINDINGS There is no evidence of fracture or subluxation. Vertebral bodies demonstrate normal height and alignment. Multilevel disc space narrowing and endplate sclerotic change are noted along the cervical spine, with anterior and posterior disc osteophyte complexes. Prevertebral soft tissues are within normal limits. The thyroid gland is unremarkable in appearance. The visualized lung apices are clear. Minimal calcification is seen at the carotid bifurcations bilaterally. IMPRESSION: 1. No evidence of traumatic intracranial injury or fracture. 2. No evidence of fracture or subluxation along the cervical spine. 3. Mild cortical volume loss and scattered small vessel ischemic microangiopathy. 4. Mild degenerative change along the cervical spine. 5. Mild partial opacification of the left maxillary sinus. Electronically Signed   By: Garald Balding M.D.   On: 06/15/2016 05:55   Ct Cervical Spine Wo Contrast  Result Date: 06/15/2016 CLINICAL DATA:  Status post fall, hitting head. Concern for head or cervical spine injury. Initial encounter. EXAM: CT HEAD WITHOUT CONTRAST CT CERVICAL SPINE WITHOUT CONTRAST TECHNIQUE: Multidetector CT imaging of the head and cervical spine was performed following the standard protocol without intravenous contrast. Multiplanar CT image reconstructions of the cervical spine were also generated. COMPARISON:  CT of the head performed 05/15/2016, and CT of the cervical spine performed 08/07/2015. MRI of the brain performed 07/07/2015 FINDINGS: CT HEAD FINDINGS There is no evidence of acute infarction, mass lesion, or intra- or extra-axial hemorrhage on CT. Prominence of the ventricles and sulci reflects mild cortical volume loss. Mild  cerebellar atrophy is noted. Scattered periventricular and subcortical white matter change likely reflects small vessel ischemic microangiopathy. The brainstem and fourth ventricle are within normal limits. The basal ganglia are unremarkable in appearance. The cerebral hemispheres demonstrate grossly normal gray-white differentiation. No mass effect or midline shift is seen. There is no evidence of fracture; visualized osseous structures are unremarkable in appearance. The visualized portions of the orbits are within normal limits. There is mild partial opacification of the left maxillary sinus. The remaining paranasal sinuses and mastoid air cells are well-aerated. No significant soft tissue abnormalities are seen. CT CERVICAL SPINE FINDINGS There is no evidence of fracture or subluxation. Vertebral bodies demonstrate normal height and alignment. Multilevel disc space narrowing and endplate sclerotic change are noted along the cervical spine, with anterior and posterior disc osteophyte complexes. Prevertebral soft tissues are within normal limits. The thyroid gland is unremarkable in appearance. The visualized lung apices are clear. Minimal calcification is seen at the carotid bifurcations bilaterally. IMPRESSION: 1. No evidence of traumatic intracranial injury or fracture. 2. No evidence of fracture or subluxation along the cervical spine. 3. Mild cortical volume loss and scattered small vessel ischemic microangiopathy. 4. Mild degenerative change along the cervical spine. 5. Mild partial opacification of the left maxillary sinus. Electronically Signed   By: Garald Balding M.D.   On: 06/15/2016 05:55   Dg Knee Left Port  Result Date: 06/15/2016 CLINICAL DATA:  Left knee pain today. No known injury. Initial encounter. EXAM: PORTABLE LEFT KNEE - 1-2 VIEW COMPARISON:  None. FINDINGS: No evidence of fracture, dislocation, or joint effusion. No evidence  of arthropathy or other focal bone abnormality. Soft tissues  are unremarkable. IMPRESSION: Negative exam. Electronically Signed   By: Inge Rise M.D.   On: 06/15/2016 16:42   Dg Hip Unilat W Or Wo Pelvis 2-3 Views Left  Result Date: 06/15/2016 CLINICAL DATA:  Status post unwitnessed fall, with left hip pain and deformity. Initial encounter. EXAM: DG HIP (WITH OR WITHOUT PELVIS) 2-3V LEFT COMPARISON:  None. FINDINGS: There is a comminuted left femoral intertrochanteric fracture, with mild displacement. The left femoral head remains seated at the acetabulum. The right hip arthroplasty is grossly unremarkable in appearance. No additional fractures are seen. The sacroiliac joints are within normal limits. Mild degenerative change is noted at the lower lumbar spine. Soft tissue swelling is noted about the left hip. IMPRESSION: Comminuted left femoral intertrochanteric fracture, with mild displacement. Electronically Signed   By: Garald Balding M.D.   On: 06/15/2016 06:21      Lab Results  Component Value Date   HGBA1C 9.5 (H) 05/09/2016   HGBA1C 9.2 (H) 03/14/2016   HGBA1C 9.4 (H) 11/01/2015   Lab Results  Component Value Date   LDLCALC 55 08/08/2015   CREATININE 2.00 (H) 06/15/2016       Scheduled Meds: . aspirin EC  325 mg Oral Daily  . atorvastatin  10 mg Oral q1800  . carvedilol  6.25 mg Oral BID WC  .  ceFAZolin (ANCEF) IV  2 g Intravenous To [facility]-Surg  . chlorhexidine  60 mL Topical Once  . Chlorhexidine Gluconate Cloth  6 each Topical Daily  . insulin aspart  0-9 Units Subcutaneous Q4H  . insulin detemir  10 Units Subcutaneous Daily  . mupirocin ointment  1 application Nasal BID  . povidone-iodine  2 application Topical Once  . ramipril  10 mg Oral Daily  . thiamine  100 mg Oral Daily   Continuous Infusions: . sodium chloride 75 mL/hr at 06/15/16 2214     LOS: 1 day    Time spent: >30 MINS    [facility]  [facility] Pager [phone]. If 7PM-7AM, please contact night-coverage at [email or web address], password  [facility] 06/16/2016, 9:37 AM  LOS: 1 day

## 2016-06-16 NOTE — Progress Notes (Signed)
Nutrition Brief Note  Patient identified on the Malnutrition Screening Tool (MST) Report. Patient with no significant weight loss per review of usual weights below. Eating well PTA.   Wt Readings from Last 15 Encounters:  05/26/16 148 lb (67.1 kg)  05/15/16 143 lb 8.3 oz (65.1 kg)  05/09/16 156 lb 1.4 oz (70.8 kg)  03/14/16 156 lb (70.8 kg)  11/01/15 146 lb 2.6 oz (66.3 kg)  09/26/15 155 lb (70.3 kg)  09/11/15 146 lb 1.9 oz (66.3 kg)  09/10/15 142 lb 6.4 oz (64.6 kg)  08/22/15 144 lb 6.4 oz (65.5 kg)  08/13/15 143 lb 9.6 oz (65.1 kg)  08/09/15 158 lb 8.2 oz (71.9 kg)  07/08/15 173 lb 12.8 oz (78.8 kg)  07/04/15 149 lb 1.6 oz (67.6 kg)  06/08/15 152 lb 12.8 oz (69.3 kg)  05/11/15 146 lb (66.2 kg)    There is no height or weight on file to calculate BMI.   Current diet order is NPO, patient is in the OR at this time. Labs and medications reviewed.   No nutrition interventions warranted at this time. If nutrition issues arise, please consult RD.   [name], RD, LDN, [place] Pager [phone] After Hours Pager [phone]

## 2016-06-16 NOTE — Anesthesia Preprocedure Evaluation (Addendum)
Anesthesia Evaluation  Patient identified by MRN, date of birth, ID band Patient awake    Reviewed: Allergy & Precautions, NPO status , Patient's Chart, lab work & pertinent test results  History of Anesthesia Complications Negative for: history of anesthetic complications  Airway Mallampati: I  TM Distance: >3 FB Neck ROM: Full    Dental  (+) Edentulous Upper, Edentulous Lower, Dental Advisory Given   Pulmonary former smoker,    Pulmonary exam normal        Cardiovascular hypertension, Pt. on medications and Pt. on home beta blockers Normal cardiovascular exam  Study Conclusions  - Left ventricle: The cavity size was normal. Wall thickness was   normal. Systolic function was normal. The estimated ejection   fraction was in the range of 60% to 65%. Wall motion was normal;   there were no regional wall motion abnormalities. Left   ventricular diastolic function parameters were normal.   Neuro/Psych PSYCHIATRIC DISORDERS Anxiety Depression CVA, No Residual Symptoms negative neurological ROS     GI/Hepatic negative GI ROS, Neg liver ROS,   Endo/Other  diabetes, Type 2, Insulin Dependent  Renal/GU Renal InsufficiencyRenal disease     Musculoskeletal   Abdominal   Peds  Hematology  (+) anemia ,   Anesthesia Other Findings   Reproductive/Obstetrics                           Anesthesia Physical Anesthesia Plan  ASA: III  Anesthesia Plan: MAC and Spinal   Post-op Pain Management:    Induction: Intravenous  Airway Management Planned: Oral ETT  Additional Equipment:   Intra-op Plan:   Post-operative Plan:   Informed Consent: I have reviewed the patients History and Physical, chart, labs and discussed the procedure including the risks, benefits and alternatives for the proposed anesthesia with the patient or authorized representative who has indicated his/her understanding and  acceptance.   Dental advisory given  Plan Discussed with: CRNA, Anesthesiologist and Surgeon  Anesthesia Plan Comments:       Anesthesia Quick Evaluation

## 2016-06-16 NOTE — Op Note (Signed)
NAMEFORRESTER, COTRELL NO.:  [id number]  MEDICAL RECORD NO.:  [id number]  LOCATION:  5N19C                        FACILITY:  [facility]  PHYSICIAN:  Rod Can, MD     DATE OF BIRTH:  23-Sep-1946  DATE OF PROCEDURE:  06/16/2016 DATE OF DISCHARGE:                              OPERATIVE REPORT   PREOPERATIVE DIAGNOSIS:  Comminuted left intertrochanteric femur fracture.  POSTOPERATIVE DIAGNOSIS:  Comminuted left intertrochanteric femur fracture.  PROCEDURE PERFORMED:  Intramedullary fixation of left intertrochanteric femur fracture.  IMPLANTS: 1. Synthes TFNA nail size 10 x 420 mm, 130 degrees. 2. TFNA screw, 95 mm. 3. 5.0 x 44 mm distal interlocking screw x1.  ANESTHESIA:  Spinal.  ANTIBIOTICS:  2 g Ancef.  COMPLICATIONS:  None.  TUBES AND DRAINS:  None.  DISPOSITION:  Stable to PACU.  INDICATIONS:  The patient is a 70 year old male, had a ground level fall at home, had left hip pain and inability to weight bear.  He was brought to the emergency department where x-rays revealed a displaced comminuted left pertrochanteric femur fracture.  He was admitted to the Hospitalist Service, underwent perioperative risk stratification, medical optimization.  Risks, benefits, alternatives to surgical fixation were explained; he elected to proceed.  DESCRIPTION OF PROCEDURE IN DETAIL:  I identified the patient in the holding area using 2 identifiers.  The surgical site was marked by myself.  He was taken to the operating room.  Spinal anesthesia was induced on his bed.  He was then transferred to the [facility] table.  Right lower extremity was scissored underneath the left.  I reduced the fracture with standard traction, internal rotation, and abduction.  He had a mild apex anterior deformity which corrected nicely with external pressure using a mallet.  I made a 4 cm incision proximal to the tip of the trochanter.  I used the awl to determine the standard  starting point.  The greater trochanter was fractured, it was somewhat difficult to get the starting point.  I advanced the guide pin under AP and lateral fluoroscopic control.  I then used the entry reamer.  I passed a guidewire to the physeal scar of the knee.  I reamed up to 11.5 with good chatter.  I then measured the length of the nail, the real nail was placed.  I seated the nail to the appropriate depth.  I made a separate stab incision.  I inserted the cannula through the jig down to the bone. I inserted a guide pin for the lag screw.  This was done in center- center position on AP and lateral fluoroscopy.  I then measured, reamed, and placed the real screw.  I used the compression nut to compress the fracture.  I then tightened the set screw, loosened it one-quarter turn. I then used perfect circle technique to place 1 distal interlocking screw.  Final AP and lateral fluoroscopy views were used to confirm near anatomic fracture reduction and hardware placement.  There was no chondral penetration.  Tip-apex distance was appropriate.  The wounds were copiously irrigated with saline.  I closed the wounds in layers with 1 Vicryl for the fascia, 2-0 Monocryl for the deep  dermal layer, running 3-0 Monocryl subcuticular stitch.  Dermabond was applied to the skin.  Once the glue dried, sterile dressings were applied.  The patient was then aroused from anesthesia, transferred to a stretcher, taken to PACU in stable condition.  Sponge, needle, and instrument counts were correct at the end of the case x2.  There were no complications.  Postoperatively, we will readmit the patient to the Hospitalist Service. He will be touchdown weightbearing left lower extremity.  I placed him on aspirin for DVT prophylaxis.  He will need physical and occupational therapy.  He will need disposition planning.  I will see him in the office 2 weeks after discharge.           ______________________________ Rod Can, MD     BS/MEDQ  D:  06/16/2016  T:  06/16/2016  Job:  [id number]

## 2016-06-16 NOTE — Interval H&P Note (Signed)
History and Physical Interval Note:  06/16/2016 10:01 AM  [name] [name]  has presented today for surgery, with the diagnosis of left intertrochanter fracture  The various methods of treatment have been discussed with the patient and family. After consideration of risks, benefits and other options for treatment, the patient has consented to  Procedure(s): INTRAMEDULLARY (IM) NAIL INTERTROCHANTRIC (Left) as a surgical intervention .  The patient's history has been reviewed, patient examined, no change in status, stable for surgery.  I have reviewed the patient's chart and labs.  Questions were answered to the patient's satisfaction.    The risks, benefits, and alternatives were discussed with the patient. There are risks associated with the surgery including, but not limited to, problems with anesthesia (death), infection, differences in leg length/angulation/rotation, fracture of bones, loosening or failure of implants, malunion, nonunion, hematoma (blood accumulation) which may require surgical drainage, blood clots, pulmonary embolism, nerve injury (foot drop), and blood vessel injury. The patient understands these risks and elects to proceed.    Santiaga Butzin, Horald Pollen

## 2016-06-16 NOTE — Anesthesia Procedure Notes (Signed)
Procedure Name: MAC Date/Time: 06/16/2016 10:33 AM Performed by: Carney Living Pre-anesthesia Checklist: Patient identified, Emergency Drugs available, Suction available, Patient being monitored and Timeout performed Patient Re-evaluated:Patient Re-evaluated prior to inductionOxygen Delivery Method: Nasal cannula

## 2016-06-16 NOTE — Discharge Instructions (Signed)
Dr. Rod Can Adult Hip & Knee Specialist [facility] [address] [place], [place] [address] [phone]   POSTOPERATIVE DIRECTIONS    Hip Rehabilitation, Guidelines Following Surgery   WEIGHT BEARING Partial weight bearing with assist device as directed.  touch down weight bearing (30%)   HOME CARE INSTRUCTIONS  Remove items at home which could result in a fall. This includes throw rugs or furniture in walking pathways.  Continue medications as instructed at time of discharge.  You may have some home medications which will be placed on hold until you complete the course of blood thinner medication.  4 days after discharge, you may start showering. No tub baths or soaking your incisions. Do not put on socks or shoes without following the instructions of your caregivers.   Sit on chairs with arms. Use the chair arms to help push yourself up when arising.  Arrange for the use of a toilet seat elevator so you are not sitting low.   Walk with walker as instructed.  You may resume a sexual relationship in one month or when given the OK by your caregiver.  Use walker as long as suggested by your caregivers.  Avoid periods of inactivity such as sitting longer than an hour when not asleep. This helps prevent blood clots.  You may return to work once you are cleared by Engineer, production.  Do not drive a car for 6 weeks or until released by your surgeon.  Do not drive while taking narcotics.  Wear elastic stockings for two weeks following surgery during the day but you may remove then at night.  Make sure you keep all of your appointments after your operation with all of your doctors and caregivers. You should call the office at the above phone number and make an appointment for approximately two weeks after the date of your surgery. Please pick up a stool softener and laxative for home use as long as you are requiring pain medications.  ICE to the affected  hip every three hours for 30 minutes at a time and then as needed for pain and swelling. Continue to use ice on the hip for pain and swelling from surgery. You may notice swelling that will progress down to the foot and ankle.  This is normal after surgery.  Elevate the leg when you are not up walking on it.   It is important for you to complete the blood thinner medication as prescribed by your doctor.  Continue to use the breathing machine which will help keep your temperature down.  It is common for your temperature to cycle up and down following surgery, especially at night when you are not up moving around and exerting yourself.  The breathing machine keeps your lungs expanded and your temperature down.  RANGE OF MOTION AND STRENGTHENING EXERCISES  These exercises are designed to help you keep full movement of your hip joint. Follow your caregiver's or physical therapist's instructions. Perform all exercises about fifteen times, three times per day or as directed. Exercise both hips, even if you have had only one joint replacement. These exercises can be done on a training (exercise) mat, on the floor, on a table or on a bed. Use whatever works the best and is most comfortable for you. Use music or television while you are exercising so that the exercises are a pleasant break in your day. This will make your life better with the exercises acting as a break in routine you  can look forward to.  Lying on your back, slowly slide your foot toward your buttocks, raising your knee up off the floor. Then slowly slide your foot back down until your leg is straight again.  Lying on your back spread your legs as far apart as you can without causing discomfort.  Lying on your side, raise your upper leg and foot straight up from the floor as far as is comfortable. Slowly lower the leg and repeat.  Lying on your back, tighten up the muscle in the front of your thigh (quadriceps muscles). You can do this by keeping  your leg straight and trying to raise your heel off the floor. This helps strengthen the largest muscle supporting your knee.  Lying on your back, tighten up the muscles of your buttocks both with the legs straight and with the knee bent at a comfortable angle while keeping your heel on the floor.   SKILLED REHAB INSTRUCTIONS: If the patient is transferred to a skilled rehab facility following release from the hospital, a list of the current medications will be sent to the facility for the patient to continue.  When discharged from the skilled rehab facility, please have the facility set up the patient's [place] prior to being released. Also, the skilled facility will be responsible for providing the patient with their medications at time of release from the facility to include their pain medication and their blood thinner medication. If the patient is still at the rehab facility at time of the two week follow up appointment, the skilled rehab facility will also need to assist the patient in arranging follow up appointment in our office and any transportation needs.  MAKE SURE YOU:  Understand these instructions.  Will watch your condition.  Will get help right away if you are not doing well or get worse.  Pick up stool softner and laxative for home use following surgery while on pain medications. Daily dry dressing changes as needed. In 4 days, you may remove your dressings and begin taking showers - no tub baths or soaking the incisions. Continue to use ice for pain and swelling after surgery. Do not use any lotions or creams on the incision until instructed by your surgeon.

## 2016-06-17 ENCOUNTER — Inpatient Hospital Stay (HOSPITAL_COMMUNITY): Payer: Medicare HMO

## 2016-06-17 ENCOUNTER — Encounter (HOSPITAL_COMMUNITY): Payer: Self-pay

## 2016-06-17 DIAGNOSIS — Z419 Encounter for procedure for purposes other than remedying health state, unspecified: Secondary | ICD-10-CM

## 2016-06-17 DIAGNOSIS — Z09 Encounter for follow-up examination after completed treatment for conditions other than malignant neoplasm: Secondary | ICD-10-CM

## 2016-06-17 LAB — CBC
HCT: 25.4 % — ABNORMAL LOW (ref 39.0–52.0)
HCT: 21.4 % — ABNORMAL LOW (ref 39.0–52.0)
Hemoglobin: 8.1 g/dL — ABNORMAL LOW (ref 13.0–17.0)
Hemoglobin: 7.1 g/dL — ABNORMAL LOW (ref 13.0–17.0)
MCH: 29.3 pg (ref 26.0–34.0)
MCH: 30.2 pg (ref 26.0–34.0)
MCHC: 31.9 g/dL (ref 30.0–36.0)
MCHC: 33.2 g/dL (ref 30.0–36.0)
MCV: 92 fL (ref 78.0–100.0)
MCV: 91.1 fL (ref 78.0–100.0)
Platelets: 145 10*3/uL — ABNORMAL LOW (ref 150–400)
Platelets: 143 10*3/uL — ABNORMAL LOW (ref 150–400)
RBC: 2.76 MIL/uL — ABNORMAL LOW (ref 4.22–5.81)
RBC: 2.35 MIL/uL — ABNORMAL LOW (ref 4.22–5.81)
RDW: 13.5 % (ref 11.5–15.5)
RDW: 13.6 % (ref 11.5–15.5)
WBC: 7.1 10*3/uL (ref 4.0–10.5)
WBC: 7.3 10*3/uL (ref 4.0–10.5)

## 2016-06-17 LAB — COMPREHENSIVE METABOLIC PANEL
ALT: 11 U/L — ABNORMAL LOW (ref 17–63)
AST: 20 U/L (ref 15–41)
Albumin: 2.8 g/dL — ABNORMAL LOW (ref 3.5–5.0)
Alkaline Phosphatase: 56 U/L (ref 38–126)
Anion gap: 6 (ref 5–15)
BUN: 17 mg/dL (ref 6–20)
CO2: 23 mmol/L (ref 22–32)
Calcium: 8.3 mg/dL — ABNORMAL LOW (ref 8.9–10.3)
Chloride: 108 mmol/L (ref 101–111)
Creatinine, Ser: 1.28 mg/dL — ABNORMAL HIGH (ref 0.61–1.24)
GFR calc Af Amer: >60 mL/min (ref >60)
GFR calc non Af Amer: 55 mL/min — ABNORMAL LOW (ref >60)
Glucose, Bld: 38 mg/dL — CL (ref 65–99)
Potassium: 3.8 mmol/L (ref 3.5–5.1)
Sodium: 137 mmol/L (ref 135–145)
Total Bilirubin: 0.3 mg/dL (ref 0.3–1.2)
Total Protein: 5.8 g/dL — ABNORMAL LOW (ref 6.5–8.1)

## 2016-06-17 LAB — GLUCOSE, CAPILLARY
Glucose-Capillary: 41 mg/dL — CL (ref 65–99)
Glucose-Capillary: 79 mg/dL (ref 65–99)
Glucose-Capillary: 470 mg/dL — ABNORMAL HIGH (ref 65–99)
Glucose-Capillary: 434 mg/dL — ABNORMAL HIGH (ref 65–99)
Glucose-Capillary: 342 mg/dL — ABNORMAL HIGH (ref 65–99)
Glucose-Capillary: 192 mg/dL — ABNORMAL HIGH (ref 65–99)

## 2016-06-17 LAB — ABO/RH: ABO/RH(D): B POS

## 2016-06-17 LAB — PREPARE RBC (CROSSMATCH)

## 2016-06-17 MED ORDER — INSULIN ASPART PROT & ASPART (70-30 MIX) 100 UNIT/ML ~~LOC~~ SUSP
12.0000 [IU] | Freq: Two times a day (BID) | SUBCUTANEOUS | Status: DC
  Filled 2016-06-17: qty 10

## 2016-06-17 MED ORDER — SODIUM CHLORIDE 0.9 % IV SOLN
Freq: Once | INTRAVENOUS | Status: AC
  Administered 2016-06-18: via INTRAVENOUS

## 2016-06-17 MED ORDER — INSULIN ASPART PROT & ASPART (70-30 MIX) 100 UNIT/ML ~~LOC~~ SUSP
18.0000 [IU] | Freq: Two times a day (BID) | SUBCUTANEOUS | Status: DC
  Administered 2016-06-17: 18 [IU] via SUBCUTANEOUS
  Filled 2016-06-17: qty 10

## 2016-06-17 NOTE — Progress Notes (Signed)
Text paged [facility] results of CBC. Awaiting further orders or callback.

## 2016-06-17 NOTE — Progress Notes (Signed)
Text paged [facility] regarding result of CT scan of abdomen. Unsure if day MD aware of result. Awaiting call back or new orders.

## 2016-06-17 NOTE — Evaluation (Signed)
Physical Therapy Evaluation Patient Details Name: [name] [name] MRN: [id number] DOB: 18-Oct-1945 Today's Date: 06/17/2016   History of Present Illness  Patient is a 70 y/o male with hx of DM, CKD, HTN, depression with multiple admissions presents after fall at home s/p ORIF left femur.  Clinical Impression  Patient presents with pain and post surgical deficits LLE s/p above surgery. PTA pt lives alone and is independent but reports some falls. Education provided on TDWB status, importance of mobility and exercises. Tolerated standing and SPT with assist of 2 for safety and to adhere to WB status. Would benefit from short term SNF to maximize independence and mobility prior to return home. Will follow.    Follow Up Recommendations SNF    Equipment Recommendations  Rolling walker with 5" wheels    Recommendations for Other Services       Precautions / Restrictions Precautions Precautions: Fall Restrictions Weight Bearing Restrictions: Yes LLE Weight Bearing: Partial weight bearing LLE Partial Weight Bearing Percentage or Pounds: 30      Mobility  Bed Mobility Overal bed mobility: Needs Assistance Bed Mobility: Supine to Sit     Supine to sit: Mod assist;+2 for physical assistance;HOB elevated     General bed mobility comments: Assist to bring LLE to EOB and to scoot bottom to EOB. Cues for sequencing and to use rails. Able to lift trunk.  Transfers Overall transfer level: Needs assistance Equipment used: Rolling walker (2 wheeled) Transfers: Stand Pivot Transfers Sit to Stand: Mod assist;+2 physical assistance;From elevated surface Stand pivot transfers: Min assist;+2 physical assistance       General transfer comment: Assist to boost to standing with cues for hand placement/technique and constant cues for WB status. Able to pivot to chair with consistent cues to adhere to WB status with therapist's foot under pt's foot.  Ambulation/Gait                Stairs             Wheelchair Mobility    Modified Rankin (Stroke Patients Only)       Balance Overall balance assessment: Needs assistance;History of Falls Sitting-balance support: Feet supported;Bilateral upper extremity supported Sitting balance-Leahy Scale: Fair     Standing balance support: During functional activity;Bilateral upper extremity supported Standing balance-Leahy Scale: Poor Standing balance comment: Reliant on BUEs for support and assist for balance and to adhere to WB precautions.Fatigues.                             Pertinent Vitals/Pain Pain Assessment: 0-10 Pain Score: 9  Pain Location: LLE Pain Descriptors / Indicators: Grimacing;Operative site guarding;Sore Pain Intervention(s): Monitored during session;Repositioned;Ice applied    Home Living Family/patient expects to be discharged to:: Private residence Living Arrangements: Alone Available Help at Discharge: Family;Available PRN/intermittently Type of Home: Apartment Home Access: Elevator;Stairs to enter   Entrance Stairs-Number of Steps: 3+6 to get to the building Home Layout: One level Home Equipment: None Additional Comments: does not drive. Sister takes to the grocery store    Prior Function Level of Independence: Independent         Comments: reports falls     Hand Dominance   Dominant Hand: Right    Extremity/Trunk Assessment   Upper Extremity Assessment: Defer to OT evaluation           Lower Extremity Assessment: Generalized weakness;LLE deficits/detail   LLE Deficits / Details: Limited AROM/strength secondary to surgery. Decreased  DF from prior CVA. Unwilling to actively move LLE during functional tasks due to pain.     Communication   Communication: Expressive difficulties (mildly slurred)  Cognition Arousal/Alertness: Awake/alert Behavior During Therapy: WFL for tasks assessed/performed Overall Cognitive Status: No family/caregiver present to determine  baseline cognitive functioning                      General Comments      Exercises General Exercises - Lower Extremity Ankle Circles/Pumps: Both;10 reps;Supine Long Arc Quad: Left;AAROM;5 reps;Seated      Assessment/Plan    PT Assessment Patient needs continued PT services  PT Diagnosis Difficulty walking;Acute pain;Generalized weakness   PT Problem List Decreased strength;Decreased mobility;Decreased knowledge of precautions;Decreased range of motion;Decreased activity tolerance;Decreased cognition;Pain;Decreased balance;Decreased knowledge of use of DME  PT Treatment Interventions DME instruction;Therapeutic activities;Gait training;Therapeutic exercise;Patient/family education;Balance training;Functional mobility training   PT Goals (Current goals can be found in the Care Plan section) Acute Rehab PT Goals Patient Stated Goal: none stated PT Goal Formulation: With patient Time For Goal Achievement: 07/01/16 Potential to Achieve Goals: Fair    Frequency Min 3X/week   Barriers to discharge Decreased caregiver support;Inaccessible home environment lives alone and has stairs to get into apt    Co-evaluation PT/OT/SLP Co-Evaluation/Treatment: Yes Reason for Co-Treatment: For patient/therapist safety PT goals addressed during session: Mobility/safety with mobility         End of Session Equipment Utilized During Treatment: Gait belt Activity Tolerance: Patient tolerated treatment well;Patient limited by pain Patient left: in chair;with call bell/phone within reach Nurse Communication: Mobility status;Other (comment) (transfer technique)         Time: [id number] PT Time Calculation (min) (ACUTE ONLY): 27 min   Charges:   PT Evaluation $PT Eval Moderate Complexity: 1 Procedure     PT G Codes:        Tallon Gertz A Zackaria Burkey 06/17/2016, 10:41 AM Wray Kearns, PT, DPT [phone]

## 2016-06-17 NOTE — Progress Notes (Addendum)
CRITICAL VALUE ALERT  Critical value received:  Blood glucose = 38 (CBG = 41)  Date of notification:  06/17/16  Time of notification:  [id number]  Critical value read back: yes  Nurse who received alert:  Kristopher Glee, RN  MD notified (1st page): [facility]  Time of first page: 0613  MD notified (2nd page):  Time of second page:  Responding MD:  Awaiting new orders or call back.  Time MD responded:    CBG checked at 0546 resulting in 41. Pt asymptomatic. Given 1 cup of OJ. Will recheck CBG in 15 minutes. CBG at 0608 = 79. Pt given snack to prevent further dropping.

## 2016-06-17 NOTE — Progress Notes (Signed)
Inpatient Diabetes Program Recommendations  AACE/ADA: New Consensus Statement on Inpatient Glycemic Control (2015)  Target Ranges:  Prepandial:   less than 140 mg/dL      Peak postprandial:   less than 180 mg/dL (1-2 hours)      Critically ill patients:  140 - 180 mg/dL   Lab Results  Component Value Date   GLUCAP 79 06/17/2016   HGBA1C 9.5 (H) 05/09/2016    Review of Glycemic Control:  Results for KINGSLEE, ZALES (MRN [id number]) as of 06/17/2016 10:17  Ref. Range 06/16/2016 12:31 06/16/2016 16:08 06/16/2016 20:34 06/17/2016 05:46 06/17/2016 06:08  Glucose-Capillary Latest Ref Range: 65 - 99 mg/dL 132 (H) 242 (H) 204 (H) 41 (LL) 79   Diabetes history: Type 2 diabetes Outpatient Diabetes medications: Novolog 70/30 mix-24 units bid Current orders for Inpatient glycemic control:  Novolog sensitive tid with meals, Novolog 70/30 mix 20 units bid  Inpatient Diabetes Program Recommendations:    Please consider reducing Novolog 70/30 mix to 12 units bid.  Text paged Dr. Allyson Sabal.  Verbal order received from Dr. Allyson Sabal.    Thanks, Adah Perl, RN, BC-ADM Inpatient Diabetes Coordinator Pager [phone] (8a-5p)

## 2016-06-17 NOTE — Progress Notes (Signed)
Text paged [facility] that pt is undecided abuot blood transfusion. RN educated pt reason pt needs the blood transfusion. Pt still requests to talk to the MD about needing the blood transfusion. Awaiting callback or new orders.

## 2016-06-17 NOTE — Evaluation (Signed)
Occupational Therapy Evaluation Patient Details Name: [name] [name] MRN: [id number] DOB: May 18, 1946 Today's Date: 06/17/2016    History of Present Illness Patient is a 70 y/o male with hx of DM, CKD, HTN, depression with multiple admissions presents after fall at home s/p ORIF left femur.   Clinical Impression   Pt was living independently prior to admission. Reports hx of falls. Pt currently requires 2 person assist for all mobility and to maintain PWB on L LE. Was not able to ambulate this visit. Pt requires min to total assist for ADL. Will need post acute rehab upon d/c. Will follow.   Follow Up Recommendations  SNF;Supervision/Assistance - 24 hour    Equipment Recommendations       Recommendations for Other Services       Precautions / Restrictions Precautions Precautions: Fall Restrictions Weight Bearing Restrictions: Yes LLE Weight Bearing: Partial weight bearing LLE Partial Weight Bearing Percentage or Pounds: 30      Mobility Bed Mobility Overal bed mobility: Needs Assistance Bed Mobility: Supine to Sit     Supine to sit: Mod assist;+2 for physical assistance;HOB elevated     General bed mobility comments: Assist to bring LLE to EOB and to scoot bottom to EOB. Cues for sequencing and to use rails. Able to lift trunk.  Transfers Overall transfer level: Needs assistance Equipment used: Rolling walker (2 wheeled) Transfers: Sit to/from [facility] Sit to Stand: Mod assist;+2 physical assistance;From elevated surface Stand pivot transfers: Min assist;+2 physical assistance       General transfer comment: Assist to boost to standing with cues for hand placement/technique and constant cues for WB status. Able to pivot to chair with consistent cues to adhere to WB status with therapist's foot under pt's foot.    Balance Overall balance assessment: Needs assistance;History of Falls Sitting-balance support: Feet supported;Bilateral upper extremity  supported Sitting balance-Leahy Scale: Fair     Standing balance support: During functional activity;Bilateral upper extremity supported Standing balance-Leahy Scale: Poor Standing balance comment: Reliant on BUEs for support and assist for balance and to adhere to WB precautions.Fatigues.                            ADL Overall ADL's : Needs assistance/impaired Eating/Feeding: Sitting;Independent   Grooming: Wash/dry hands;Wash/dry face;Sitting;Set up   Upper Body Bathing: Minimal assitance;Sitting   Lower Body Bathing: Total assistance;Sit to/from stand   Upper Body Dressing : Minimal assistance;Sitting   Lower Body Dressing: Total assistance;Sit to/from stand   Toilet Transfer: +2 for physical assistance;Moderate assistance;Stand-pivot;RW Toilet Transfer Details (indicate cue type and reason): simulated to chair                 Vision     Perception     Praxis      Pertinent Vitals/Pain Pain Assessment: 0-10 Pain Score: 9  Pain Location: L hip Pain Descriptors / Indicators: Aching;Guarding;Grimacing Pain Intervention(s): Monitored during session;Premedicated before session;Repositioned;Ice applied     Hand Dominance Right   Extremity/Trunk Assessment Upper Extremity Assessment Upper Extremity Assessment: LUE deficits/detail LUE Deficits / Details: 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 Unwilling to actively move LLE during functional tasks due to pain. LLE Sensation:  [facility].)       Communication Communication Communication: Expressive difficulties (mildly slurred)  Cognition Arousal/Alertness: Awake/alert Behavior During Therapy: WFL for tasks assessed/performed Overall Cognitive Status: No family/caregiver present to determine baseline cognitive  functioning                     General Comments       Exercises       Shoulder Instructions      Home Living Family/patient expects to be discharged to:: Private residence Living Arrangements: Alone Available Help at Discharge: Family;Available PRN/intermittently Type of Home: Apartment Home Access: Elevator;Stairs to enter Entrance Stairs-Number of Steps: 3+6 to get to the building   Home Layout: One level     Bathroom Shower/Tub: Teacher, early years/pre: Standard     Home Equipment: None   Additional Comments: does not drive. Sister takes to the grocery store      Prior Functioning/Environment Level of Independence: Independent        Comments: reports falls    OT Diagnosis: Generalized weakness;Acute pain;Cognitive deficits   OT Problem List: Decreased strength;Decreased activity tolerance;Impaired balance (sitting and/or standing);Decreased cognition;Decreased coordination;Decreased knowledge of use of DME or AE;Pain   OT Treatment/Interventions: Self-care/ADL training;DME and/or AE instruction;Therapeutic activities;Patient/family education;Balance training    OT Goals(Current goals can be found in the care plan section) Acute Rehab OT Goals Patient Stated Goal: none stated OT Goal Formulation: With patient Time For Goal Achievement: 07/01/16 Potential to Achieve Goals: Good ADL Goals Pt Will Perform Grooming: with min guard assist;standing (one activity) Pt Will Perform Upper Body Dressing: with supervision;sitting Pt Will Perform Lower Body Dressing: with supervision;with adaptive equipment;sitting/lateral leans Pt Will Transfer to Toilet: with min assist;ambulating;bedside commode Pt Will Perform Toileting - Clothing Manipulation and hygiene: with min assist;sit to/from stand Additional ADL Goal #1: Pt will perform bed mobility with min assist in preparation for ADL at EOB.  OT Frequency: Min 2X/week   Barriers to D/C: Decreased  caregiver support;Inaccessible home environment          Co-evaluation PT/OT/SLP Co-Evaluation/Treatment: Yes Reason for Co-Treatment: For patient/therapist safety PT goals addressed during session: Mobility/safety with mobility        End of Session Nurse Communication: Mobility status  Activity Tolerance: Patient tolerated treatment well Patient left: in chair;with call bell/phone within reach   Time: 1009-1030 OT Time Calculation (min): 21 min Charges:  OT General Charges $OT Visit: 1 Procedure OT Evaluation $OT Eval Moderate Complexity: 1 Procedure G-Codes:    [name] 06/17/2016, 10:58 AM  [phone]

## 2016-06-17 NOTE — Progress Notes (Signed)
   Subjective:  Patient reports pain as mild to moderate.    Objective:   VITALS:   Vitals:   06/16/16 2028 06/17/16 0000 06/17/16 0511 06/17/16 1221  BP: 124/60 114/62 129/64 124/64  Pulse: 71 78 70 79  Resp: 16 16 16 16   Temp: 99 F (37.2 C) 98.9 F (37.2 C) 99 F (37.2 C) 99.2 F (37.3 C)  TempSrc: Oral Oral Oral Oral  SpO2: 97% 94% 95% 95%    ABD soft Sensation intact distally Intact pulses distally Dorsiflexion/Plantar flexion intact Incision: dressing C/D/I Compartment soft   Lab Results  Component Value Date   WBC 7.1 06/17/2016   HGB 8.1 (L) 06/17/2016   HCT 25.4 (L) 06/17/2016   MCV 92.0 06/17/2016   PLT 145 (L) 06/17/2016   BMET    Component Value Date/Time   NA 137 06/17/2016 0450   K 3.8 06/17/2016 0450   CL 108 06/17/2016 0450   CO2 23 06/17/2016 0450   GLUCOSE 38 (LL) 06/17/2016 0450   BUN 17 06/17/2016 0450   CREATININE 1.28 (H) 06/17/2016 0450   CALCIUM 8.3 (L) 06/17/2016 0450   GFRNONAA 55 (L) 06/17/2016 0450   GFRAA >60 06/17/2016 0450     Assessment/Plan: 1 Day Post-Op   Active Problems:   Closed left subtrochanteric femur fracture ([name])   Closed comminuted intertrochanteric fracture of left femur ([name])   Renal insufficiency   Elective surgery   Postop check   TDWB LLE PO pain control DVT ppx: plavix and ASA PT/OT D/C planning, SNF placement   Maryetta Shafer, Horald Pollen 06/17/2016, 1:46 PM   Rod Can, MD Cell [phone]

## 2016-06-17 NOTE — Progress Notes (Signed)
[facility] talking with pt about reason for needing blood transfusion.

## 2016-06-17 NOTE — Progress Notes (Signed)
[facility] PROGRESS NOTE  [name] [name] [id number] DOB: 12-03-1945 DOA: 06/15/2016   PCP: Mauricio Po, [name]     Assessment/Plan: Active Problems:   Closed left subtrochanteric femur fracture ([place])   Closed comminuted intertrochanteric fracture of left femur [facility])   Renal insufficiency   Elective surgery   Postop check  70 y.o.malewith medical history significant for insulin-dependent diabetes mellitus, hypertension, hyperlipidemia, ischemic CVA in 2016, and chronic kidney disease stage III who presentedto the emergency department after a fall. CBG was 517, when checked by EMS. Blood pressure 140/70  Assessment and plan 1. Left femoral intratrochanteric fracture. We'll keep patient nothing by mouth, will continue supportive medical therapy with IV morphine as needed for pain. DVT prophylaxis with mechanical compression devices. Patient anticipated to have surgery today by Dr. Lyla Glassing. Patient seems to be functional at home able to do at least 4 metabolic equivalents, no active cardiovascular disease. From the cardiovascular perspective since to be low to intermediate risk for a intermediate risk procedure. Physical therapy evaluation recommended SNF. Hemoglobin dropped from 10.2> 8.1. May need transfusion tomorrow if lower  2. Hypertension. Continue antihypertensive agents with carvedilol 6.25 g twice daily and ramipril 10 mg daily. At home on dual antiplatelet therapy. We'll hold amlodipine for now.   3. Chronic kidney disease stage III. Baseline creatinine around 2.0. Will continue hydration with normal saline at 75 mL per hour. Potassium is 5.0>3.8. Will follow kidney function in the morning, avoid hypotension or nephrotoxic agents.  4. T2 DM. Presented with severe hyperglycemia, recent history of diabetic ketoacidosis, At home patient is on insulin 70/30 24 units twice daily, continue Accu-Cheks. Depending on serum glucose will adjust basal regimen of long-acting  insulin.  Patient eating postoperatively. Unfortunately CBG has fluctuated from 41 to greater than 400 today. He has been admitted to the hospital in late July and early August for uncontrolled diabetes including diabetic ketoacidosis. Will change 30/30-18 units twice a day and monitor CBGs closely  5. Dyslipidemia continue atorvastatin.  6. Ischemic CVA. Will continue aspirin for now, held Plavix prior to surgery,  Patient currently on aspirin and Plavix  7. Anemia of chronic disease-patient had a hemoglobin of 13.9 on 05/15/16, hemoglobin now 8.1, has slowly drifted down, check anemia panel, drops further may need CT to rule out retroperitoneal bleed. Will order CT abdomen pelvis to rule out retroperitoneal bleed. Transfuse for hemoglobin less than 7.5     DVT prophylaxsis Lovenox  Code Status:  Full code    Family Communication: Discussed in detail with the patient, all imaging results, lab results explained to the patient   Disposition Plan:  Physical therapy recommending SNF, discharge tomorrow if CBGs have stabilized     Consultants:  Orthopedics  Procedures:  None  Antibiotics: Anti-infectives    Start     Dose/Rate Route Frequency Ordered Stop   06/16/16 1600  ceFAZolin (ANCEF) IVPB 2g/100 mL premix     2 g 200 mL/hr over 30 Minutes Intravenous Every 6 hours 06/16/16 1230 06/16/16 2204   06/16/16 1300  ceFAZolin (ANCEF) IVPB 2g/100 mL premix     2 g 200 mL/hr over 30 Minutes Intravenous To [facility] 06/15/16 1813 06/16/16 1045         HPI/Subjective: Sitting in the chair, denies any nausea vomiting chest pain shortness of breath  Objective: Vitals:   06/16/16 2028 06/17/16 0000 06/17/16 0511 06/17/16 1221  BP: 124/60 114/62 129/64 124/64  Pulse: 71 78 70 79  Resp: 16 16 16 16   Temp: 99 F (37.2 C) 98.9 F (37.2 C) 99 F (37.2 C) 99.2 F (37.3 C)  TempSrc: Oral Oral Oral Oral  SpO2: 97% 94% 95% 95%    Intake/Output Summary (Last 24  hours) at 06/17/16 1333 Last data filed at 06/17/16 0900  Gross per 24 hour  Intake           1392.5 ml  Output              875 ml  Net            517.5 ml    Exam:  Examination:  General exam: Appears calm and comfortable  Respiratory system: Clear to auscultation. Respiratory effort normal. Cardiovascular system: S1 & S2 heard, RRR. No JVD, murmurs, rubs, gallops or clicks. No pedal edema. Gastrointestinal system: Abdomen is nondistended, soft and nontender. No organomegaly or masses felt. Normal bowel sounds heard. Central nervous system: Alert and oriented. No focal neurological deficits. Extremities: Symmetric 5 x 5 power. Skin: No rashes, lesions or ulcers Psychiatry: Judgement and insight appear normal. Mood & affect appropriate.     Data Reviewed: I have personally reviewed following labs and imaging studies  Micro Results Recent Results (from the past 240 hour(s))  Surgical pcr screen     Status: Abnormal   Collection Time: 06/15/16  7:14 PM  Result Value Ref Range Status   MRSA, PCR NEGATIVE NEGATIVE Final   Staphylococcus aureus POSITIVE (A) NEGATIVE Final    Comment:        The Xpert SA Assay (FDA approved for NASAL specimens in patients over 1 years of age), is one component of a comprehensive surveillance program.  Test performance has been validated by [facility] for patients greater than or equal to 54 year old. It is not intended to diagnose infection nor to guide or monitor treatment.     Radiology Reports Dg Chest 1 View  Result Date: 06/15/2016 CLINICAL DATA:  Acute onset of left hip pain and deformity after fall. Initial encounter. EXAM: CHEST 1 VIEW COMPARISON:  Chest radiograph performed 05/15/2016 FINDINGS: The lungs are well-aerated and clear. There is no evidence of focal opacification, pleural effusion or pneumothorax. The cardiomediastinal silhouette is within normal limits. No acute osseous abnormalities are seen. IMPRESSION: No acute  cardiopulmonary process seen. No displaced rib fractures identified. Electronically Signed   By: Garald Balding M.D.   On: 06/15/2016 06:20   Ct Head Wo Contrast  Result Date: 06/15/2016 CLINICAL DATA:  Status post fall, hitting head. Concern for head or cervical spine injury. Initial encounter. EXAM: CT HEAD WITHOUT CONTRAST CT CERVICAL SPINE WITHOUT CONTRAST TECHNIQUE: Multidetector CT imaging of the head and cervical spine was performed following the standard protocol without intravenous contrast. Multiplanar CT image reconstructions of the cervical spine were also generated. COMPARISON:  CT of the head performed 05/15/2016, and CT of the cervical spine performed 08/07/2015. MRI of the brain performed 07/07/2015 FINDINGS: CT HEAD FINDINGS There is no evidence of acute infarction, mass lesion, or intra- or extra-axial hemorrhage on CT. Prominence of the ventricles and sulci reflects mild cortical volume loss. Mild cerebellar atrophy is noted. Scattered periventricular and subcortical white matter change likely reflects small vessel ischemic microangiopathy. The brainstem and fourth ventricle are within normal limits. The basal ganglia are unremarkable in appearance. The cerebral hemispheres demonstrate grossly normal gray-white differentiation. No mass effect or midline shift is seen. There is no evidence of fracture; visualized osseous structures are unremarkable  in appearance. The visualized portions of the orbits are within normal limits. There is mild partial opacification of the left maxillary sinus. The remaining paranasal sinuses and mastoid air cells are well-aerated. No significant soft tissue abnormalities are seen. CT CERVICAL SPINE FINDINGS There is no evidence of fracture or subluxation. Vertebral bodies demonstrate normal height and alignment. Multilevel disc space narrowing and endplate sclerotic change are noted along the cervical spine, with anterior and posterior disc osteophyte complexes.  Prevertebral soft tissues are within normal limits. The thyroid gland is unremarkable in appearance. The visualized lung apices are clear. Minimal calcification is seen at the carotid bifurcations bilaterally. IMPRESSION: 1. No evidence of traumatic intracranial injury or fracture. 2. No evidence of fracture or subluxation along the cervical spine. 3. Mild cortical volume loss and scattered small vessel ischemic microangiopathy. 4. Mild degenerative change along the cervical spine. 5. Mild partial opacification of the left maxillary sinus. Electronically Signed   By: Garald Balding M.D.   On: 06/15/2016 05:55   Ct Cervical Spine Wo Contrast  Result Date: 06/15/2016 CLINICAL DATA:  Status post fall, hitting head. Concern for head or cervical spine injury. Initial encounter. EXAM: CT HEAD WITHOUT CONTRAST CT CERVICAL SPINE WITHOUT CONTRAST TECHNIQUE: Multidetector CT imaging of the head and cervical spine was performed following the standard protocol without intravenous contrast. Multiplanar CT image reconstructions of the cervical spine were also generated. COMPARISON:  CT of the head performed 05/15/2016, and CT of the cervical spine performed 08/07/2015. MRI of the brain performed 07/07/2015 FINDINGS: CT HEAD FINDINGS There is no evidence of acute infarction, mass lesion, or intra- or extra-axial hemorrhage on CT. Prominence of the ventricles and sulci reflects mild cortical volume loss. Mild cerebellar atrophy is noted. Scattered periventricular and subcortical white matter change likely reflects small vessel ischemic microangiopathy. The brainstem and fourth ventricle are within normal limits. The basal ganglia are unremarkable in appearance. The cerebral hemispheres demonstrate grossly normal gray-white differentiation. No mass effect or midline shift is seen. There is no evidence of fracture; visualized osseous structures are unremarkable in appearance. The visualized portions of the orbits are within normal  limits. There is mild partial opacification of the left maxillary sinus. The remaining paranasal sinuses and mastoid air cells are well-aerated. No significant soft tissue abnormalities are seen. CT CERVICAL SPINE FINDINGS There is no evidence of fracture or subluxation. Vertebral bodies demonstrate normal height and alignment. Multilevel disc space narrowing and endplate sclerotic change are noted along the cervical spine, with anterior and posterior disc osteophyte complexes. Prevertebral soft tissues are within normal limits. The thyroid gland is unremarkable in appearance. The visualized lung apices are clear. Minimal calcification is seen at the carotid bifurcations bilaterally. IMPRESSION: 1. No evidence of traumatic intracranial injury or fracture. 2. No evidence of fracture or subluxation along the cervical spine. 3. Mild cortical volume loss and scattered small vessel ischemic microangiopathy. 4. Mild degenerative change along the cervical spine. 5. Mild partial opacification of the left maxillary sinus. Electronically Signed   By: Garald Balding M.D.   On: 06/15/2016 05:55   Pelvis Portable  Result Date: 06/16/2016 CLINICAL DATA:  70 year old male with a history of left hip fracture repair EXAM: PORTABLE PELVIS 1-2 VIEWS COMPARISON:  06/15/2016 FINDINGS: Bony pelvic ring intact. Surgical changes of right hip arthroplasty, unchanged. Interval ORIF of left hip fracture with anatomic alignment reestablish. No complicating features. IMPRESSION: Early postoperative changes of left hip intratrochanteric fracture ORIF with anatomic alignment reestablished. No complicating features. Surgical changes of  right hip arthroplasty. Signed, Dulcy Fanny. Earleen Newport, DO [facility] Radiology Electronically Signed   By: Corrie Mckusick D.O.   On: 06/16/2016 13:01   Dg Knee Left Port  Result Date: 06/15/2016 CLINICAL DATA:  Left knee pain today. No known injury. Initial encounter.  EXAM: PORTABLE LEFT KNEE - 1-2 VIEW COMPARISON:  None. FINDINGS: No evidence of fracture, dislocation, or joint effusion. No evidence of arthropathy or other focal bone abnormality. Soft tissues are unremarkable. IMPRESSION: Negative exam. Electronically Signed   By: Inge Rise M.D.   On: 06/15/2016 16:42   Dg C-arm 1-60 Min  Result Date: 06/16/2016 CLINICAL DATA:  ORIF left hip EXAM: DG C-ARM 61-120 MIN; LEFT FEMUR 2 VIEWS FLUOROSCOPY TIME:  1 minutes 55 seconds COMPARISON:  Left hip radiographs dated 06/15/2016 FINDINGS: Intraoperative fluoroscopic radiographs during IM nail with dynamic hip screw fixation of an intertrochanteric left hip fracture. Fracture fragments are in near anatomic alignment and position. Single distal interlocking screw. IMPRESSION: Intraoperative fluoroscopic radiographs during ORIF of an intertrochanteric left hip fracture, as above. Electronically Signed   By: Julian Hy M.D.   On: 06/16/2016 12:17   Dg Hip Unilat W Or Wo Pelvis 2-3 Views Left  Result Date: 06/15/2016 CLINICAL DATA:  Status post unwitnessed fall, with left hip pain and deformity. Initial encounter. EXAM: DG HIP (WITH OR WITHOUT PELVIS) 2-3V LEFT COMPARISON:  None. FINDINGS: There is a comminuted left femoral intertrochanteric fracture, with mild displacement. The left femoral head remains seated at the acetabulum. The right hip arthroplasty is grossly unremarkable in appearance. No additional fractures are seen. The sacroiliac joints are within normal limits. Mild degenerative change is noted at the lower lumbar spine. Soft tissue swelling is noted about the left hip. IMPRESSION: Comminuted left femoral intertrochanteric fracture, with mild displacement. Electronically Signed   By: Garald Balding M.D.   On: 06/15/2016 06:21   Dg Femur Min 2 Views Left  Result Date: 06/16/2016 CLINICAL DATA:  ORIF left hip EXAM: DG C-ARM 61-120 MIN; LEFT FEMUR 2 VIEWS FLUOROSCOPY TIME:  1 minutes 55 seconds  COMPARISON:  Left hip radiographs dated 06/15/2016 FINDINGS: Intraoperative fluoroscopic radiographs during IM nail with dynamic hip screw fixation of an intertrochanteric left hip fracture. Fracture fragments are in near anatomic alignment and position. Single distal interlocking screw. IMPRESSION: Intraoperative fluoroscopic radiographs during ORIF of an intertrochanteric left hip fracture, as above. Electronically Signed   By: Julian Hy M.D.   On: 06/16/2016 12:17   Dg Femur Port Min 2 Views Left  Result Date: 06/16/2016 CLINICAL DATA:  70 year old male with a history of postoperative stricture or pair left hip. EXAM: LEFT FEMUR PORTABLE 2 VIEWS COMPARISON:  06/15/2016 FINDINGS: Early postoperative changes of left inter trochanteric hip fracture repair with antegrade left femoral intra medullary rod and gamma nail fixation at the fracture site alignment is now anatomic. No complicating features. Single distal interlocking screw. IMPRESSION: Early postoperative changes of left intratrochanteric hip fracture ORIF with antegrade intra medullary rod and gamma nail fixation of the femoral head, with single distal interlocking screw. No complicating features. Signed, Dulcy Fanny. Earleen Newport, DO [facility] Radiology Electronically Signed   By: Corrie Mckusick D.O.   On: 06/16/2016 12:59     CBC  Recent Labs Lab 06/15/16 0521 06/15/16 0534 06/17/16 0450  WBC  --  8.1 7.1  HGB 10.2* 8.9* 8.1*  HCT 30.0* 27.1* 25.4*  PLT  --  186 145*  MCV  --  89.1 92.0  MCH  --  29.3 29.3  MCHC  --  32.8 31.9  RDW  --  14.1 13.5  LYMPHSABS  --  1.0  --   MONOABS  --  1.0  --   EOSABS  --  0.1  --   BASOSABS  --  0.0  --     Chemistries   Recent Labs Lab 06/15/16 0511 06/15/16 0521 06/17/16 0450  NA 135 136 137  K 4.6 5.0 3.8  CL 104 102 108  CO2 22  --  23  GLUCOSE 492* 477* 38*  BUN 30* 34* 17  CREATININE 2.09* 2.00* 1.28*  CALCIUM 8.6*  --  8.3*   AST 19  --  20  ALT 13*  --  11*  ALKPHOS 70  --  56  BILITOT 1.0  --  0.3   ------------------------------------------------------------------------------------------------------------------ CrCl cannot be calculated (Unknown ideal weight.). ------------------------------------------------------------------------------------------------------------------ No results for input(s): HGBA1C in the last 72 hours. ------------------------------------------------------------------------------------------------------------------ No results for input(s): CHOL, HDL, LDLCALC, TRIG, CHOLHDL, LDLDIRECT in the last 72 hours. ------------------------------------------------------------------------------------------------------------------ No results for input(s): TSH, T4TOTAL, T3FREE, THYROIDAB in the last 72 hours.  Invalid input(s): FREET3 ------------------------------------------------------------------------------------------------------------------  Recent Labs  06/16/16 1459  VITAMINB12 167*  FOLATE 31.4  FERRITIN 177  TIBC 216*  IRON 16*  RETICCTPCT 1.3    Coagulation profile  Recent Labs Lab 06/15/16 0511  INR 0.97    No results for input(s): DDIMER in the last 72 hours.  Cardiac Enzymes No results for input(s): CKMB, TROPONINI, MYOGLOBIN in the last 168 hours.  Invalid input(s): CK ------------------------------------------------------------------------------------------------------------------ Invalid input(s): POCBNP   CBG:  Recent Labs Lab 06/16/16 2034 06/17/16 0546 06/17/16 0608 06/17/16 1109 06/17/16 1232  GLUCAP 204* 41* 79 470* 434*       Studies: Pelvis Portable  Result Date: 06/16/2016 CLINICAL DATA:  70 year old male with a history of left hip fracture repair EXAM: PORTABLE PELVIS 1-2 VIEWS COMPARISON:  06/15/2016 FINDINGS: Bony pelvic ring intact. Surgical changes of right hip arthroplasty, unchanged. Interval ORIF of left hip fracture with  anatomic alignment reestablish. No complicating features. IMPRESSION: Early postoperative changes of left hip intratrochanteric fracture ORIF with anatomic alignment reestablished. No complicating features. Surgical changes of right hip arthroplasty. Signed, Dulcy Fanny. Earleen Newport, DO [facility] Radiology Electronically Signed   By: Corrie Mckusick D.O.   On: 06/16/2016 13:01   Dg Knee Left Port  Result Date: 06/15/2016 CLINICAL DATA:  Left knee pain today. No known injury. Initial encounter. EXAM: PORTABLE LEFT KNEE - 1-2 VIEW COMPARISON:  None. FINDINGS: No evidence of fracture, dislocation, or joint effusion. No evidence of arthropathy or other focal bone abnormality. Soft tissues are unremarkable. IMPRESSION: Negative exam. Electronically Signed   By: Inge Rise M.D.   On: 06/15/2016 16:42   Dg C-arm 1-60 Min  Result Date: 06/16/2016 CLINICAL DATA:  ORIF left hip EXAM: DG C-ARM 61-120 MIN; LEFT FEMUR 2 VIEWS FLUOROSCOPY TIME:  1 minutes 55 seconds COMPARISON:  Left hip radiographs dated 06/15/2016 FINDINGS: Intraoperative fluoroscopic radiographs during IM nail with dynamic hip screw fixation of an intertrochanteric left hip fracture. Fracture fragments are in near anatomic alignment and position. Single distal interlocking screw. IMPRESSION: Intraoperative fluoroscopic radiographs during ORIF of an intertrochanteric left hip fracture, as above. Electronically Signed   By: Julian Hy M.D.   On: 06/16/2016 12:17   Dg Femur Min 2 Views Left  Result Date: 06/16/2016 CLINICAL DATA:  ORIF left hip EXAM: DG C-ARM  61-120 MIN; LEFT FEMUR 2 VIEWS FLUOROSCOPY TIME:  1 minutes 55 seconds COMPARISON:  Left hip radiographs dated 06/15/2016 FINDINGS: Intraoperative fluoroscopic radiographs during IM nail with dynamic hip screw fixation of an intertrochanteric left hip fracture. Fracture fragments are in near anatomic alignment and position. Single distal  interlocking screw. IMPRESSION: Intraoperative fluoroscopic radiographs during ORIF of an intertrochanteric left hip fracture, as above. Electronically Signed   By: Julian Hy M.D.   On: 06/16/2016 12:17   Dg Femur Port Min 2 Views Left  Result Date: 06/16/2016 CLINICAL DATA:  70 year old male with a history of postoperative stricture or pair left hip. EXAM: LEFT FEMUR PORTABLE 2 VIEWS COMPARISON:  06/15/2016 FINDINGS: Early postoperative changes of left inter trochanteric hip fracture repair with antegrade left femoral intra medullary rod and gamma nail fixation at the fracture site alignment is now anatomic. No complicating features. Single distal interlocking screw. IMPRESSION: Early postoperative changes of left intratrochanteric hip fracture ORIF with antegrade intra medullary rod and gamma nail fixation of the femoral head, with single distal interlocking screw. No complicating features. Signed, Dulcy Fanny. Earleen Newport, DO [facility] Radiology Electronically Signed   By: Corrie Mckusick D.O.   On: 06/16/2016 12:59      Lab Results  Component Value Date   HGBA1C 9.5 (H) 05/09/2016   HGBA1C 9.2 (H) 03/14/2016   HGBA1C 9.4 (H) 11/01/2015   Lab Results  Component Value Date   LDLCALC 55 08/08/2015   CREATININE 1.28 (H) 06/17/2016       Scheduled Meds: . amLODipine  10 mg Oral Daily  . aspirin EC  81 mg Oral BID WC  . atorvastatin  10 mg Oral q1800  . carvedilol  6.25 mg Oral BID WC  . Chlorhexidine Gluconate Cloth  6 each Topical Daily  . clopidogrel  75 mg Oral Daily  . docusate sodium  100 mg Oral BID  . insulin aspart  0-9 Units Subcutaneous TID WC  . insulin aspart protamine- aspart  18 Units Subcutaneous BID WC  . mupirocin ointment  1 application Nasal BID  . ramipril  10 mg Oral Daily  . senna  1 tablet Oral BID  . sodium chloride  250 mL Intravenous Once  . thiamine  100 mg Oral Daily   Continuous Infusions: . sodium  chloride 75 mL/hr at 06/16/16 1523     LOS: 2 days    Time spent: >30 MINS    [place] [facility] Pager [phone]. If 7PM-7AM, please contact night-coverage at [email or web address], password [facility] 06/17/2016, 1:33 PM  LOS: 2 days

## 2016-06-17 NOTE — Progress Notes (Signed)
RN explains possible reactions from blood transfusion such as, SOB, itching, and severe lower back pain. Pt verbalizes understanding. Consent signed.

## 2016-06-17 NOTE — Plan of Care (Addendum)
Attending ordered Abd/pelvic CT today secondary to drop in Hgb since Left femur repair. Pt had a fall at home prior to admission. CT results showed trace left retroperitoneal hemorrhage and left iliopsoas and piriformis muscle hematomas. After CT resulted this NP checked a stat H/H which showed Hgb 7.1. Transfusion threshold 7.5. Ordered 1 unit PRBC with H/H after.  RN called back stating pt was hesitant about receiving a transfusion. NP spoke with pt on phone. Pt concerned because he had never received blood before. NP explained reason for needing the transfusion and the results of the CT. Explained the risks of receiving a blood transfusion, including transfusion reaction. After explanation, he agrees to proceed. TF 1 unit, recheck H/H after. Daily and serial CBCs depending on results after transfusion. Follow closely given results of trace hemorrhage on CT.   KJKG, NP [facility] Update: Pt did well with blood transfusion. Hgb now 7.9. Recheck at 11am. Report left for attending.  KJKG, NP [facility]

## 2016-06-18 ENCOUNTER — Encounter (HOSPITAL_COMMUNITY): Payer: Self-pay | Admitting: Internal Medicine

## 2016-06-18 DIAGNOSIS — E1169 Type 2 diabetes mellitus with other specified complication: Secondary | ICD-10-CM

## 2016-06-18 DIAGNOSIS — E538 Deficiency of other specified B group vitamins: Secondary | ICD-10-CM | POA: Diagnosis present

## 2016-06-18 DIAGNOSIS — Z8673 Personal history of transient ischemic attack (TIA), and cerebral infarction without residual deficits: Secondary | ICD-10-CM

## 2016-06-18 DIAGNOSIS — D62 Acute posthemorrhagic anemia: Secondary | ICD-10-CM

## 2016-06-18 DIAGNOSIS — K661 Hemoperitoneum: Secondary | ICD-10-CM

## 2016-06-18 DIAGNOSIS — N189 Chronic kidney disease, unspecified: Secondary | ICD-10-CM

## 2016-06-18 DIAGNOSIS — N183 Chronic kidney disease, stage 3 (moderate): Secondary | ICD-10-CM

## 2016-06-18 DIAGNOSIS — S72142A Displaced intertrochanteric fracture of left femur, initial encounter for closed fracture: Secondary | ICD-10-CM

## 2016-06-18 DIAGNOSIS — K683 Retroperitoneal hematoma: Secondary | ICD-10-CM | POA: Diagnosis present

## 2016-06-18 DIAGNOSIS — I10 Essential (primary) hypertension: Secondary | ICD-10-CM

## 2016-06-18 DIAGNOSIS — E1121 Type 2 diabetes mellitus with diabetic nephropathy: Secondary | ICD-10-CM

## 2016-06-18 DIAGNOSIS — E785 Hyperlipidemia, unspecified: Secondary | ICD-10-CM

## 2016-06-18 DIAGNOSIS — Z419 Encounter for procedure for purposes other than remedying health state, unspecified: Secondary | ICD-10-CM

## 2016-06-18 DIAGNOSIS — E1165 Type 2 diabetes mellitus with hyperglycemia: Secondary | ICD-10-CM

## 2016-06-18 DIAGNOSIS — Z794 Long term (current) use of insulin: Secondary | ICD-10-CM

## 2016-06-18 DIAGNOSIS — N179 Acute kidney failure, unspecified: Secondary | ICD-10-CM

## 2016-06-18 HISTORY — DX: Deficiency of other specified B group vitamins: E53.8

## 2016-06-18 HISTORY — DX: Hemoperitoneum: K66.1

## 2016-06-18 HISTORY — DX: Retroperitoneal hematoma: K68.3

## 2016-06-18 HISTORY — DX: Acute posthemorrhagic anemia: D62

## 2016-06-18 LAB — CBC
HCT: 24 % — ABNORMAL LOW (ref 39.0–52.0)
HCT: 24.9 % — ABNORMAL LOW (ref 39.0–52.0)
Hemoglobin: 7.9 g/dL — ABNORMAL LOW (ref 13.0–17.0)
Hemoglobin: 8.2 g/dL — ABNORMAL LOW (ref 13.0–17.0)
MCH: 29.6 pg (ref 26.0–34.0)
MCH: 29.6 pg (ref 26.0–34.0)
MCHC: 32.9 g/dL (ref 30.0–36.0)
MCHC: 32.9 g/dL (ref 30.0–36.0)
MCV: 89.9 fL (ref 78.0–100.0)
MCV: 89.9 fL (ref 78.0–100.0)
Platelets: 137 10*3/uL — ABNORMAL LOW (ref 150–400)
Platelets: 152 10*3/uL (ref 150–400)
RBC: 2.67 MIL/uL — ABNORMAL LOW (ref 4.22–5.81)
RBC: 2.77 MIL/uL — ABNORMAL LOW (ref 4.22–5.81)
RDW: 13.6 % (ref 11.5–15.5)
RDW: 13.7 % (ref 11.5–15.5)
WBC: 7 10*3/uL (ref 4.0–10.5)
WBC: 6.6 10*3/uL (ref 4.0–10.5)

## 2016-06-18 LAB — COMPREHENSIVE METABOLIC PANEL
ALT: 8 U/L — ABNORMAL LOW (ref 17–63)
AST: 16 U/L (ref 15–41)
Albumin: 2.4 g/dL — ABNORMAL LOW (ref 3.5–5.0)
Alkaline Phosphatase: 54 U/L (ref 38–126)
Anion gap: 8 (ref 5–15)
BUN: 15 mg/dL (ref 6–20)
CO2: 19 mmol/L — ABNORMAL LOW (ref 22–32)
Calcium: 7.9 mg/dL — ABNORMAL LOW (ref 8.9–10.3)
Chloride: 107 mmol/L (ref 101–111)
Creatinine, Ser: 1.35 mg/dL — ABNORMAL HIGH (ref 0.61–1.24)
GFR calc Af Amer: 59 mL/min — ABNORMAL LOW (ref >60)
GFR calc non Af Amer: 51 mL/min — ABNORMAL LOW (ref >60)
Glucose, Bld: 211 mg/dL — ABNORMAL HIGH (ref 65–99)
Potassium: 3.8 mmol/L (ref 3.5–5.1)
Sodium: 134 mmol/L — ABNORMAL LOW (ref 135–145)
Total Bilirubin: 1 mg/dL (ref 0.3–1.2)
Total Protein: 5 g/dL — ABNORMAL LOW (ref 6.5–8.1)

## 2016-06-18 LAB — GLUCOSE, CAPILLARY
Glucose-Capillary: 216 mg/dL — ABNORMAL HIGH (ref 65–99)
Glucose-Capillary: 191 mg/dL — ABNORMAL HIGH (ref 65–99)
Glucose-Capillary: 199 mg/dL — ABNORMAL HIGH (ref 65–99)
Glucose-Capillary: 80 mg/dL (ref 65–99)

## 2016-06-18 MED ORDER — INSULIN GLARGINE 100 UNIT/ML ~~LOC~~ SOLN
20.0000 [IU] | Freq: Every day | SUBCUTANEOUS | Status: DC
  Administered 2016-06-18: 20 [IU] via SUBCUTANEOUS
  Filled 2016-06-18 (×2): qty 0.2

## 2016-06-18 MED ORDER — INSULIN ASPART 100 UNIT/ML ~~LOC~~ SOLN
4.0000 [IU] | Freq: Three times a day (TID) | SUBCUTANEOUS | Status: DC
  Administered 2016-06-18 – 2016-06-19 (×4): 4 [IU] via SUBCUTANEOUS

## 2016-06-18 MED ORDER — INSULIN ASPART 100 UNIT/ML ~~LOC~~ SOLN
0.0000 [IU] | Freq: Three times a day (TID) | SUBCUTANEOUS | Status: DC
  Administered 2016-06-18 (×2): 3 [IU] via SUBCUTANEOUS
  Administered 2016-06-19: 8 [IU] via SUBCUTANEOUS
  Administered 2016-06-19: 15 [IU] via SUBCUTANEOUS

## 2016-06-18 MED ORDER — CYANOCOBALAMIN 1000 MCG/ML IJ SOLN
1000.0000 ug | Freq: Once | INTRAMUSCULAR | Status: AC
  Administered 2016-06-18: 1000 ug via INTRAMUSCULAR
  Filled 2016-06-18: qty 1

## 2016-06-18 MED ORDER — INSULIN ASPART 100 UNIT/ML ~~LOC~~ SOLN: 0.0000 [IU] | Freq: Every day | SUBCUTANEOUS | Status: DC

## 2016-06-18 MED ORDER — KETOROLAC TROMETHAMINE 15 MG/ML IJ SOLN
15.0000 mg | Freq: Four times a day (QID) | INTRAMUSCULAR | Status: DC | PRN
  Administered 2016-06-18 – 2016-06-20 (×3): 15 mg via INTRAVENOUS
  Filled 2016-06-18 (×3): qty 1

## 2016-06-18 NOTE — Progress Notes (Addendum)
Progress Note    [name] [name]  [id number] DOB: 03/15/1946  DOA: 06/15/2016 PCP: Mauricio Po, [name]    Brief Narrative:   Chief complaint: Follow-up left femur fracture  [name] [name] is an 70 y.o. male with medical history significant for insulin-dependent diabetes mellitus, hypertension, hyperlipidemia, ischemic CVA in 2016, and chronic kidney disease stage III who was admitted 06/15/16 after a fall, resulting in a closed left subtrochanteric femur fracture. CBG was 517, when checked by EMS.  Assessment/Plan:   Principal Problem:   Closed comminuted intertrochanteric fracture of left femur ([name]) resulting from a fall Underwent intramedullary fixation of left intertrochanteric femur fracture by Dr. Delfino Lovett on 06/16/16. TDWB LLE.  Aspirin and Plavix recommended for DVT prophylaxis. PT/OT evaluations performed. Will need SNF placement.  Active Problems:   Hypertension Continue Norvasc, Coreg and ramipril.    Acute kidney injury/Stage III chronic kidney disease Baseline creatinine is around 1.18, current creatinine down to 1.35 after reaching a high of 2.09 on admission, approaching baseline values. Continue gentle hydration.    Type 2 diabetes, brittle Currently being managed with 18 units of 70/30 twice a day and insulin sensitive SSI 3 times a day. CBG range 192-470. We'll discontinue 70/30 insulin, and start Lantus 20 units daily and moderate scale SSI with meal coverage. Will request diabetes coordinator consultation.    Dyslipidemia associated with type 2 diabetes Continue atorvastatin.    History of CVA Continue aspirin, Plavix and secondary risk factor modification.     Acute on chronic anemia/postoperative anemia secondary to retroperitoneal hemorrhage/B 12 deficiency Hemoglobin has trended down post surgery. CT of the abdomen and pelvis done 06/17/16 which showed a trace retroperitoneal hemorrhage with left iliopsoas and left piriformis intramuscular hematomas. Given 1 unit  of PRBCs overnight with post transfusion hemoglobin of 7.9.Repeat hemoglobin stable at 8.2. Anemia panel showed low B-12 at 167. We'll supplement. Recheck hemoglobin again in the morning and if stable, can likely be discharged to SNF.   Family Communication/Anticipated D/C date and plan/Code Status   DVT prophylaxis: Aspirin/Plavix ordered. Code Status: Full Code.  Family Communication: No family present.  Patient declines my offer to call family. Disposition Plan: Lives alone. Will need SNF placement.   Medical Consultants:    Orthopedic Surgery   Procedures:    Intramedullary fixation of left intertrochanteric femur fracture 06/16/16  Anti-Infectives:    None  Subjective:   The patient reports that his left leg is sore, pain is rated 9/10 and is dull in quality. Review of symptoms is negative for dyspnea and nausea. Bowels are moving.  Appetite is "ok".  Objective:    Vitals:   06/17/16 2352 06/18/16 0026 06/18/16 0300 06/18/16 0529  BP: (!) 110/48 (!) 124/50 (!) 152/77 (!) 153/71  Pulse: 74 69 71 75  Resp: 16 16 16 18   Temp: 98.9 F (37.2 C) 98.7 F (37.1 C) 98.8 F (37.1 C) 98.6 F (37 C)  TempSrc: Oral Oral Oral Oral  SpO2: 93% 96% 97% 97%    Intake/Output Summary (Last 24 hours) at 06/18/16 0750 Last data filed at 06/18/16 0700  Gross per 24 hour  Intake             1355 ml  Output             1825 ml  Net             -470 ml   There were no vitals filed for this visit.  Exam: General exam: Appears  calm and comfortable.  Respiratory system: Clear to auscultation. Respiratory effort normal. Cardiovascular system: S1 & S2 heard, RRR. No JVD,  rubs, gallops or clicks. No murmurs. Gastrointestinal system: Abdomen is nondistended, soft and nontender. No organomegaly or masses felt. Normal bowel sounds heard. Central nervous system: Alert and oriented. No focal neurological deficits. Extremities: No clubbing,  or cyanosis. No edema. SCDs on. Skin: No  rashes, lesions or ulcers. Psychiatry: Judgement and insight appear normal. Mood & affect appropriate.   Data Reviewed:   I have personally reviewed following labs and imaging studies:  Labs: Basic Metabolic Panel:  Recent Labs Lab 06/15/16 0511 06/15/16 0521 06/17/16 0450 06/18/16 0530  NA 135 136 137 134*  K 4.6 5.0 3.8 3.8  CL 104 102 108 107  CO2 22  --  23 19*  GLUCOSE 492* 477* 38* 211*  BUN 30* 34* 17 15  CREATININE 2.09* 2.00* 1.28* 1.35*  CALCIUM 8.6*  --  8.3* 7.9*   GFR CrCl cannot be calculated (Unknown ideal weight.). Liver Function Tests:  Recent Labs Lab 06/15/16 0511 06/17/16 0450 06/18/16 0530  AST 19 20 16   ALT 13* 11* 8*  ALKPHOS 70 56 54  BILITOT 1.0 0.3 1.0  PROT 6.9 5.8* 5.0*  ALBUMIN 3.8 2.8* 2.4*   Coagulation profile  Recent Labs Lab 06/15/16 0511  INR 0.97    CBC:  Recent Labs Lab 06/15/16 0521 06/15/16 0534 06/17/16 0450 06/17/16 2039 06/18/16 0530  WBC  --  8.1 7.1 7.3 7.0  NEUTROABS  --  6.0  --   --   --   HGB 10.2* 8.9* 8.1* 7.1* 7.9*  HCT 30.0* 27.1* 25.4* 21.4* 24.0*  MCV  --  89.1 92.0 91.1 89.9  PLT  --  186 145* 143* 137*   CBG:  Recent Labs Lab 06/17/16 1109 06/17/16 1232 06/17/16 1740 06/17/16 2157 06/18/16 0654  GLUCAP 470* 434* 342* 192* 216*   Anemia work up:  Recent Labs  06/16/16 1459  VITAMINB12 167*  FOLATE 31.4  FERRITIN 177  TIBC 216*  IRON 16*  RETICCTPCT 1.3   Sepsis Labs:  Recent Labs Lab 06/15/16 0534 06/17/16 0450 06/17/16 2039 06/18/16 0530  WBC 8.1 7.1 7.3 7.0    Microbiology Recent Results (from the past 240 hour(s))  Surgical pcr screen     Status: Abnormal   Collection Time: 06/15/16  7:14 PM  Result Value Ref Range Status   MRSA, PCR NEGATIVE NEGATIVE Final   Staphylococcus aureus POSITIVE (A) NEGATIVE Final    Comment:        The Xpert SA Assay (FDA approved for NASAL specimens in patients over 54 years of age), is one component of a comprehensive  surveillance program.  Test performance has been validated by [facility] for patients greater than or equal to 64 year old. It is not intended to diagnose infection nor to guide or monitor treatment.     Radiology: Ct Abdomen Pelvis Wo Contrast  Result Date: 06/17/2016 CLINICAL DATA:  [place] yesterday resulting in LEFT femur fracture. Drop in hemoglobin. Assess for retroperitoneal hemorrhage. History of hypertension and diabetes. EXAM: CT ABDOMEN AND PELVIS WITHOUT CONTRAST TECHNIQUE: Multidetector CT imaging of the abdomen and pelvis was performed following the standard protocol without IV contrast. COMPARISON:  Pelvic radiograph June 16, 2016 FINDINGS: LUNG BASES: Bibasilar atelectasis. Heart size is mildly enlarged. Partially imaged coronary artery calcifications. No pericardial effusion. KIDNEYS/BLADDER: Kidneys are orthotopic, demonstrating normal size and morphology. No nephrolithiasis, hydronephrosis; limited assessment for  renal masses on this nonenhanced examination. 11 mm LEFT lower pole cyst. 14 mm LEFT interpolar cyst. The unopacified ureters are normal in course and caliber. Urinary bladder is well distended and unremarkable. SOLID ORGANS: The liver, gallbladder, pancreas are unremarkable for this non-contrast examination. Trace splenic calcifications. Thickened adrenal glands without dominant nodule most compatible with hyperplasia. GASTROINTESTINAL TRACT: The stomach, small and large bowel are normal in course and caliber without inflammatory changes, the sensitivity may be decreased by lack of enteric contrast. Normal appendix. PERITONEUM/RETROPERITONEUM: Aortoiliac vessels are normal in course and caliber, mild calcific atherosclerosis. No lymphadenopathy by CT size criteria. Prostate obscured by streak artifact. No definite intraperitoneal free fluid or free air. Trace retroperitoneal free fluid/hemorrhage. SOFT TISSUES/ OSSEOUS STRUCTURES: Mildly enlarged LEFT iliopsoas and LEFT  piriformis muscles. RIGHT piriformis muscle atrophy. Enlarged LEFT pelvic muscles most compatible with intramuscular hematoma. Bilateral flank subcutaneous fat stranding. Small amount of gas about the LEFT hip consistent with recent surgery for femur fracture. Small to moderate fat containing umbilical hernia. Severe L4-5 and L5-S1 degenerative discs with broad-based disc osteophyte complexes resulting in moderate canal stenosis L5-S1 and RIGHT lateral recess effacement which may affect the traversing RIGHT S1 nerve. Severe L4-5 and L5-S1 neural foraminal narrowing. IMPRESSION: Trace retroperitoneal hemorrhage with LEFT iliopsoas and LEFT piriformis intramuscular hematomas. Status post recent LEFT femur fracture ORIF. Electronically Signed   By: Elon Alas M.D.   On: 06/17/2016 17:18   Pelvis Portable  Result Date: 06/16/2016 CLINICAL DATA:  70 year old male with a history of left hip fracture repair EXAM: PORTABLE PELVIS 1-2 VIEWS COMPARISON:  06/15/2016 FINDINGS: Bony pelvic ring intact. Surgical changes of right hip arthroplasty, unchanged. Interval ORIF of left hip fracture with anatomic alignment reestablish. No complicating features. IMPRESSION: Early postoperative changes of left hip intratrochanteric fracture ORIF with anatomic alignment reestablished. No complicating features. Surgical changes of right hip arthroplasty. Signed, Dulcy Fanny. Earleen Newport, DO [facility] Electronically Signed   By: Corrie Mckusick D.O.   On: 06/16/2016 13:01   Dg C-arm 1-60 Min  Result Date: 06/16/2016 CLINICAL DATA:  ORIF left hip EXAM: DG C-ARM 61-120 MIN; LEFT FEMUR 2 VIEWS FLUOROSCOPY TIME:  1 minutes 55 seconds COMPARISON:  Left hip radiographs dated 06/15/2016 FINDINGS: Intraoperative fluoroscopic radiographs during IM nail with dynamic hip screw fixation of an intertrochanteric left hip fracture. Fracture fragments are in near anatomic alignment and position.  Single distal interlocking screw. IMPRESSION: Intraoperative fluoroscopic radiographs during ORIF of an intertrochanteric left hip fracture, as above. Electronically Signed   By: Julian Hy M.D.   On: 06/16/2016 12:17   Dg Femur Min 2 Views Left  Result Date: 06/16/2016 CLINICAL DATA:  ORIF left hip EXAM: DG C-ARM 61-120 MIN; LEFT FEMUR 2 VIEWS FLUOROSCOPY TIME:  1 minutes 55 seconds COMPARISON:  Left hip radiographs dated 06/15/2016 FINDINGS: Intraoperative fluoroscopic radiographs during IM nail with dynamic hip screw fixation of an intertrochanteric left hip fracture. Fracture fragments are in near anatomic alignment and position. Single distal interlocking screw. IMPRESSION: Intraoperative fluoroscopic radiographs during ORIF of an intertrochanteric left hip fracture, as above. Electronically Signed   By: Julian Hy M.D.   On: 06/16/2016 12:17   Dg Femur Port Min 2 Views Left  Result Date: 06/16/2016 CLINICAL DATA:  70 year old male with a history of postoperative stricture or pair left hip. EXAM: LEFT FEMUR PORTABLE 2 VIEWS COMPARISON:  06/15/2016 FINDINGS: Early postoperative changes of left inter trochanteric hip fracture repair with antegrade left femoral intra  medullary rod and gamma nail fixation at the fracture site alignment is now anatomic. No complicating features. Single distal interlocking screw. IMPRESSION: Early postoperative changes of left intratrochanteric hip fracture ORIF with antegrade intra medullary rod and gamma nail fixation of the femoral head, with single distal interlocking screw. No complicating features. Signed, Dulcy Fanny. Earleen Newport, DO [facility] Electronically Signed   By: Corrie Mckusick D.O.   On: 06/16/2016 12:59    Medications:   . amLODipine  10 mg Oral Daily  . aspirin EC  81 mg Oral BID WC  . atorvastatin  10 mg Oral q1800  . carvedilol  6.25 mg Oral BID WC  . Chlorhexidine Gluconate Cloth  6  each Topical Daily  . clopidogrel  75 mg Oral Daily  . docusate sodium  100 mg Oral BID  . insulin aspart  0-9 Units Subcutaneous TID WC  . insulin aspart protamine- aspart  18 Units Subcutaneous BID WC  . mupirocin ointment  1 application Nasal BID  . ramipril  10 mg Oral Daily  . senna  1 tablet Oral BID  . sodium chloride  250 mL Intravenous Once  . thiamine  100 mg Oral Daily   Continuous Infusions: . sodium chloride 75 mL/hr at 06/18/16 0300    Medical decision making is highly complex with several minor problems, stable established problem, and 2 new problems (retroperitoneal hematoma/B 12 deficiency) requiring data review, review of old records, and high complexity decision making making this a level III visit.    LOS: 3 days   [facility] Pager [phone]. If unable to reach me by pager, please call my cell phone at [phone].  *Please refer to [email or web address], password [email or web address] to get updated schedule on who will round on this patient, as hospitalists switch teams weekly. If 7PM-7AM, please contact night-coverage at [email or web address], password [email or web address] for any overnight needs.  06/18/2016, 7:50 AM

## 2016-06-18 NOTE — NC FL2 (Signed)
[place] LEVEL OF CARE SCREENING TOOL     IDENTIFICATION  Patient Name: [name] [name] Birthdate: 1945-11-19 Sex: male Admission Date (Current Location): 06/15/2016  [facility] and [place] Number:  Herbalist and Address:  [facility] [place] [place], [place] [address], [place], [place] [address]      Provider Number: [id number]  Attending Physician Name and Address:  Venetia Maxon Rama, MD  Relative Name and Phone Number:       Current Level of Care: Hospital Recommended Level of Care: [place] Prior Approval Number:    Date Approved/Denied:   PASRR Number:    Discharge Plan: SNF    Current Diagnoses: Patient Active Problem List   Diagnosis Date Noted  . Postoperative anemia due to acute blood loss 06/18/2016  . Retroperitoneal hematoma 06/18/2016  . Vitamin B 12 deficiency 06/18/2016  . Elective surgery   . Postop check   . Closed comminuted intertrochanteric fracture of left femur ([place])   . Renal insufficiency   . Hyponatremia 05/15/2016  . Anxiety 05/15/2016  . History of CVA (cerebrovascular accident) 05/15/2016  . Acute on chronic renal failure ([place]) 05/09/2016  . Chronic prescription benzodiazepine use 11/02/2015  . Narcotic dependency, continuous ([place]) 11/02/2015  . Uncontrolled type 2 diabetes mellitus with diabetic nephropathy, with long-term current use of insulin ([place]) 08/12/2015  . Benign essential HTN 08/12/2015  . Dyslipidemia associated with type 2 diabetes mellitus ([place]) 08/12/2015  . Depression 08/12/2015  . CKD (chronic kidney disease) stage 3, GFR 30-59 ml/min 03/20/2014    Orientation RESPIRATION BLADDER Height & Weight     Self, Time, Situation, Place  Normal Continent Weight:   Height:     BEHAVIORAL SYMPTOMS/MOOD NEUROLOGICAL BOWEL NUTRITION STATUS      Continent    AMBULATORY STATUS COMMUNICATION OF NEEDS Skin   Extensive Assist Verbally Normal                       Personal Care  Assistance Level of Assistance  Bathing, Dressing Bathing Assistance: Maximum assistance   Dressing Assistance: Maximum assistance     Functional Limitations Info             SPECIAL CARE FACTORS FREQUENCY                       Contractures      Additional Factors Info   (Full)               Current Medications (06/18/2016):  This is the current hospital active medication list Current Facility-Administered Medications  Medication Dose Route Frequency Provider Last Rate Last Dose  . 0.9 %  sodium chloride infusion   Intravenous Continuous Tawni Millers, MD 75 mL/hr at 06/18/16 1230 75 mL at 06/18/16 1230  . acetaminophen (TYLENOL) tablet 650 mg  650 mg Oral Q6H PRN Rod Can, MD   650 mg at 06/16/16 1751   Or  . acetaminophen (TYLENOL) suppository 650 mg  650 mg Rectal Q6H PRN Rod Can, MD      . amLODipine (NORVASC) tablet 10 mg  10 mg Oral Daily Reyne Dumas, MD   10 mg at 06/18/16 0918  . aspirin EC tablet 81 mg  81 mg Oral BID WC Rod Can, MD   81 mg at 06/18/16 1701  . atorvastatin (LIPITOR) tablet 10 mg  10 mg Oral q1800 Tawni Millers, MD   10 mg at 06/18/16 1701  .  carvedilol (COREG) tablet 6.25 mg  6.25 mg Oral BID WC Tawni Millers, MD   6.25 mg at 06/18/16 1701  . Chlorhexidine Gluconate Cloth 2 % PADS 6 each  6 each Topical Daily Mauricio Gerome Apley, MD   6 each at 06/18/16 1000  . clopidogrel (PLAVIX) tablet 75 mg  75 mg Oral Daily Reyne Dumas, MD   75 mg at 06/18/16 1540  . docusate sodium (COLACE) capsule 100 mg  100 mg Oral BID Rod Can, MD   100 mg at 06/18/16 [id number]  . insulin aspart (novoLOG) injection 0-15 Units  0-15 Units Subcutaneous TID WC Venetia Maxon Rama, MD   3 Units at 06/18/16 1701  . insulin aspart (novoLOG) injection 0-5 Units  0-5 Units Subcutaneous QHS Christina P Rama, MD      . insulin aspart (novoLOG) injection 4 Units  4 Units Subcutaneous TID WC Venetia Maxon Rama, MD   4 Units at  06/18/16 1701  . insulin glargine (LANTUS) injection 20 Units  20 Units Subcutaneous Daily Venetia Maxon Rama, MD   20 Units at 06/18/16 [phone]  . ketorolac (TORADOL) 15 MG/ML injection 15 mg  15 mg Intravenous Q6H PRN Venetia Maxon Rama, MD   15 mg at 06/18/16 1749  . menthol-cetylpyridinium (CEPACOL) lozenge 3 mg  1 lozenge Oral PRN Rod Can, MD       Or  . phenol (CHLORASEPTIC) mouth spray 1 spray  1 spray Mouth/Throat PRN Rod Can, MD      . methocarbamol (ROBAXIN) 500 mg in dextrose 5 % 50 mL IVPB  500 mg Intravenous Q6H PRN Rhetta Mura Schorr, NP   500 mg at 06/18/16 1749  . metoCLOPramide (REGLAN) tablet 5-10 mg  5-10 mg Oral Q8H PRN Rod Can, MD       Or  . metoCLOPramide (REGLAN) injection 5-10 mg  5-10 mg Intravenous Q8H PRN Rod Can, MD      . mupirocin ointment (BACTROBAN) 2 % 1 application  1 application Nasal BID Tawni Millers, MD   1 application at [id number] [phone]  . ondansetron (ZOFRAN) tablet 4 mg  4 mg Oral Q6H PRN Rod Can, MD       Or  . ondansetron [facility]) injection 4 mg  4 mg Intravenous Q6H PRN Rod Can, MD      . oxyCODONE-acetaminophen (PERCOCET/ROXICET) 5-325 MG per tablet 1 tablet  1 tablet Oral Q4H PRN Latanya Maudlin, MD   1 tablet at 06/18/16 1540   And  . oxyCODONE (Oxy IR/ROXICODONE) immediate release tablet 5 mg  5 mg Oral Q4H PRN Latanya Maudlin, MD   5 mg at 06/18/16 1540  . polyethylene glycol (MIRALAX / GLYCOLAX) packet 17 g  17 g Oral Daily PRN Rod Can, MD      . ramipril (ALTACE) capsule 10 mg  10 mg Oral Daily Mauricio Gerome Apley, MD   10 mg at 06/18/16 1058  . senna (SENOKOT) tablet 8.6 mg  1 tablet Oral BID Rod Can, MD   8.6 mg at 06/18/16 [id number]  . sodium phosphate (FLEET) 7-19 GM/118ML enema 1 enema  1 enema Rectal Once PRN Rod Can, MD      . thiamine (VITAMIN B-1) tablet 100 mg  100 mg Oral Daily Mauricio Gerome Apley, MD   100 mg at 06/18/16 [id number]     Discharge Medications: Please see  discharge summary for a list of discharge medications.  Relevant Imaging Results:  Relevant Lab Results:   Additional Information  Letti Towell R

## 2016-06-18 NOTE — Progress Notes (Signed)
Physical Therapy Treatment Patient Details Name: [name] [name] MRN: [id number] DOB: [birth year] Today's Date: 06/18/2016    History of Present Illness Patient is a 70 y/o male with hx of DM, CKD, HTN, depression with multiple admissions presents after fall at home s/p ORIF left femur.    PT Comments    Pt able to ambulate 8 ft with rw and min assist during PT session. Pt continues to require +2 assistance with bed mobility and transfers. Recommending SNF for further rehab following acute stay.   Follow Up Recommendations  SNF;Supervision for mobility/OOB     Equipment Recommendations  Rolling walker with 5" wheels    Recommendations for Other Services       Precautions / Restrictions Precautions Precautions: Fall Restrictions Weight Bearing Restrictions: Yes LLE Weight Bearing: Partial weight bearing LLE Partial Weight Bearing Percentage or Pounds: 30    Mobility  Bed Mobility Overal bed mobility: Needs Assistance Bed Mobility: Supine to Sit     Supine to sit: +2 for physical assistance;Min assist;Mod assist     General bed mobility comments: assist provided with LLE and trunk to get to sitting EOB. Pt using rail to assist.   Transfers Overall transfer level: Needs assistance Equipment used: Rolling walker (2 wheeled) Transfers: Sit to/from Stand Sit to Stand: Mod assist;+2 physical assistance;From elevated surface         General transfer comment: cues for hand placement  Ambulation/Gait Ambulation/Gait assistance: Min assist Ambulation Distance (Feet): 8 Feet Assistive device: Rolling walker (2 wheeled) Gait Pattern/deviations: Step-to pattern Gait velocity: slow pattern   General Gait Details: Pt consistent with weightbearing restriction, cues to not overstride with Rt.    Stairs            Wheelchair Mobility    Modified Rankin (Stroke Patients Only)       Balance Overall balance assessment: Needs assistance Sitting-balance support: No  upper extremity supported Sitting balance-Leahy Scale: Good     Standing balance support: Bilateral upper extremity supported Standing balance-Leahy Scale: Poor Standing balance comment: using rw                    Cognition Arousal/Alertness: Awake/alert Behavior During Therapy: WFL for tasks assessed/performed Overall Cognitive Status: Within Functional Limits for tasks assessed                      Exercises      General Comments        Pertinent Vitals/Pain Pain Assessment: 0-10 Pain Score: 8  Pain Location: Lt hip Pain Descriptors / Indicators: Throbbing;Aching Pain Intervention(s): Limited activity within patient's tolerance;Monitored during session;RN gave pain meds during session    Home Living                      Prior Function            PT Goals (current goals can now be found in the care plan section) Acute Rehab PT Goals Patient Stated Goal: move without pain PT Goal Formulation: With patient Time For Goal Achievement: 07/01/16 Potential to Achieve Goals: Fair Progress towards PT goals: Progressing toward goals    Frequency  Min 3X/week    PT Plan Current plan remains appropriate    Co-evaluation             End of Session Equipment Utilized During Treatment: Gait belt Activity Tolerance: Patient tolerated treatment well Patient left: in chair;with call bell/phone within reach;with chair alarm set  Time: [id number] PT Time Calculation (min) (ACUTE ONLY): 19 min  Charges:  $Gait Training: 8-22 mins                    G Codes:      Cassell Clement, PT, CSCS Pager [phone] Office [phone]  06/18/2016, 10:42 AM

## 2016-06-18 NOTE — Progress Notes (Signed)
   Subjective:  Patient reports pain as mild to moderate.  Given PRBCs yesterday.  Objective:   VITALS:   Vitals:   06/17/16 2352 06/18/16 0026 06/18/16 0300 06/18/16 0529  BP: (!) 110/48 (!) 124/50 (!) 152/77 (!) 153/71  Pulse: 74 69 71 75  Resp: 16 16 16 18   Temp: 98.9 F (37.2 C) 98.7 F (37.1 C) 98.8 F (37.1 C) 98.6 F (37 C)  TempSrc: Oral Oral Oral Oral  SpO2: 93% 96% 97% 97%    ABD soft Sensation intact distally Intact pulses distally Dorsiflexion/Plantar flexion intact Incision: dressing C/D/I Compartment soft   Lab Results  Component Value Date   WBC 7.0 06/18/2016   HGB 7.9 (L) 06/18/2016   HCT 24.0 (L) 06/18/2016   MCV 89.9 06/18/2016   PLT 137 (L) 06/18/2016   BMET    Component Value Date/Time   NA 134 (L) 06/18/2016 0530   K 3.8 06/18/2016 0530   CL 107 06/18/2016 0530   CO2 19 (L) 06/18/2016 0530   GLUCOSE 211 (H) 06/18/2016 0530   BUN 15 06/18/2016 0530   CREATININE 1.35 (H) 06/18/2016 0530   CALCIUM 7.9 (L) 06/18/2016 0530   GFRNONAA 51 (L) 06/18/2016 0530   GFRAA 59 (L) 06/18/2016 0530     Assessment/Plan: 2 Days Post-Op   Principal Problem:   Closed comminuted intertrochanteric fracture of left femur ([name]) Active Problems:   Closed left subtrochanteric femur fracture ([name])   Renal insufficiency   Elective surgery   Postop check   TDWB LLE PO pain control DVT ppx: plavix and ASA PT/OT D/C planning, SNF placement   Zenia Guest, Horald Pollen 06/18/2016, 8:11 AM   Rod Can, MD Cell [phone]

## 2016-06-18 NOTE — Progress Notes (Signed)
Inpatient Diabetes Program Recommendations  AACE/ADA: New Consensus Statement on Inpatient Glycemic Control (2015)  Target Ranges:  Prepandial:   less than 140 mg/dL      Peak postprandial:   less than 180 mg/dL (1-2 hours)      Critically ill patients:  140 - 180 mg/dL   Lab Results  Component Value Date   GLUCAP 216 (H) 06/18/2016   HGBA1C 9.5 (H) 05/09/2016    Review of Glycemic Control:  Results for DAEVEON, CAMILLI (MRN [id number]) as of 06/18/2016 10:36  Ref. Range 06/17/2016 11:09 06/17/2016 12:32 06/17/2016 17:40 06/17/2016 21:57 06/18/2016 06:54  Glucose-Capillary Latest Ref Range: 65 - 99 mg/dL 470 (H) 434 (H) 342 (H) 192 (H) 216 (H)   Note that blood sugars increased on 06/17/16 after low yesterday morning.  Patient now on Basal/bolus regimen:  Lantus 20 units daily, Novolog 4 units tid with meals, and Novolog moderate tid with meals. Agree with current regimen.   Will follow.    Thanks,  Adah Perl, RN, BC-ADM Inpatient Diabetes Coordinator Pager [phone] (8a-5p)

## 2016-06-19 LAB — GLUCOSE, CAPILLARY
Glucose-Capillary: 54 mg/dL — ABNORMAL LOW (ref 65–99)
Glucose-Capillary: 92 mg/dL (ref 65–99)
Glucose-Capillary: 361 mg/dL — ABNORMAL HIGH (ref 65–99)
Glucose-Capillary: 277 mg/dL — ABNORMAL HIGH (ref 65–99)
Glucose-Capillary: 17 mg/dL — CL (ref 65–99)
Glucose-Capillary: 70 mg/dL (ref 65–99)
Glucose-Capillary: 72 mg/dL (ref 65–99)

## 2016-06-19 LAB — PREPARE RBC (CROSSMATCH)

## 2016-06-19 LAB — BASIC METABOLIC PANEL
Anion gap: 6 (ref 5–15)
BUN: 13 mg/dL (ref 6–20)
CO2: 24 mmol/L (ref 22–32)
Calcium: 8.2 mg/dL — ABNORMAL LOW (ref 8.9–10.3)
Chloride: 109 mmol/L (ref 101–111)
Creatinine, Ser: 1.22 mg/dL (ref 0.61–1.24)
GFR calc Af Amer: >60 mL/min (ref >60)
GFR calc non Af Amer: 58 mL/min — ABNORMAL LOW (ref >60)
Glucose, Bld: 45 mg/dL — ABNORMAL LOW (ref 65–99)
Potassium: 3.6 mmol/L (ref 3.5–5.1)
Sodium: 139 mmol/L (ref 135–145)

## 2016-06-19 LAB — CBC
HCT: 22.5 % — ABNORMAL LOW (ref 39.0–52.0)
Hemoglobin: 7.3 g/dL — ABNORMAL LOW (ref 13.0–17.0)
MCH: 29.1 pg (ref 26.0–34.0)
MCHC: 32.4 g/dL (ref 30.0–36.0)
MCV: 89.6 fL (ref 78.0–100.0)
Platelets: 166 10*3/uL (ref 150–400)
RBC: 2.51 MIL/uL — ABNORMAL LOW (ref 4.22–5.81)
RDW: 14.1 % (ref 11.5–15.5)
WBC: 6.7 10*3/uL (ref 4.0–10.5)

## 2016-06-19 MED ORDER — DEXTROSE 50 % IV SOLN
INTRAVENOUS | Status: AC
  Administered 2016-06-19: 25 mL
  Filled 2016-06-19: qty 50

## 2016-06-19 MED ORDER — INSULIN GLARGINE 100 UNIT/ML ~~LOC~~ SOLN
10.0000 [IU] | Freq: Every day | SUBCUTANEOUS | Status: DC
  Administered 2016-06-19: 10 [IU] via SUBCUTANEOUS
  Filled 2016-06-19: qty 0.1

## 2016-06-19 MED ORDER — SODIUM CHLORIDE 0.9 % IV SOLN
Freq: Once | INTRAVENOUS | Status: AC
  Administered 2016-06-19: 11:00:00 via INTRAVENOUS

## 2016-06-19 MED ORDER — INSULIN GLARGINE 100 UNIT/ML ~~LOC~~ SOLN
8.0000 [IU] | Freq: Every day | SUBCUTANEOUS | Status: DC
  Administered 2016-06-20 – 2016-06-21 (×2): 8 [IU] via SUBCUTANEOUS
  Filled 2016-06-19 (×2): qty 0.08

## 2016-06-19 MED ORDER — INSULIN ASPART 100 UNIT/ML ~~LOC~~ SOLN
0.0000 [IU] | Freq: Three times a day (TID) | SUBCUTANEOUS | Status: DC
  Administered 2016-06-20: 3 [IU] via SUBCUTANEOUS
  Administered 2016-06-20: 5 [IU] via SUBCUTANEOUS
  Administered 2016-06-21: 9 [IU] via SUBCUTANEOUS

## 2016-06-19 NOTE — Progress Notes (Addendum)
BS 17- pt. staring and confused; little diaphoretic; 12.5 grams Dextrose 50% IV given; repeat BS 15 min. 70; pt. alert and oriented x4 now, back to himself now. Baltazar Najjar, NP informed of low BS.

## 2016-06-19 NOTE — Progress Notes (Signed)
Progress Note    [name] [name]  [id number] DOB: [birth year]  DOA: 06/15/2016 PCP: Mauricio Po, [name]    Brief Narrative:   Chief complaint: Follow-up left femur fracture  [name] [name] is an 70 y.o. male with medical history significant for insulin-dependent diabetes mellitus, hypertension, hyperlipidemia, ischemic CVA in 2016, and chronic kidney disease stage III who was admitted 06/15/16 after a fall, resulting in a closed left subtrochanteric femur fracture. CBG was 517, when checked by EMS.  Assessment/Plan:   Principal Problem:   Closed comminuted intertrochanteric fracture of left femur ([name]) resulting from a fall Underwent intramedullary fixation of left intertrochanteric femur fracture by Dr. Delfino Lovett on 06/16/16. TDWB LLE.  Aspirin and Plavix recommended for DVT prophylaxis (hold for now, secondary to retroperitoneal hematoma with dropping hemoglobin). PT/OT evaluations performed. Will need SNF placement.  Active Problems:   Hypertension Continue Norvasc, Coreg and ramipril. BP stable.    Acute kidney injury/Stage III chronic kidney disease Baseline creatinine is around 1.18, current creatinine down to 1.22 after reaching a high of 2.09 on admission, approaching baseline values. Continue gentle hydration.    Type 2 diabetes, brittle/hypoglycemia Currently being managed with Lantus 20 units daily and moderate scale SSI with meal coverage. CBGs 54-199. We'll decrease Lantus to 10 units. Diabetes coordinator following.    Dyslipidemia associated with type 2 diabetes Continue atorvastatin.    History of CVA Hold aspirin, Plavix secondary to retroperitoneal bleeding.  Control BP/lipids/DM.     Acute on chronic anemia/postoperative anemia secondary to retroperitoneal hemorrhage/B 12 deficiency Hemoglobin has trended down post surgery. CT of the abdomen and pelvis done 06/17/16 which showed a trace retroperitoneal hemorrhage with left iliopsoas and left piriformis intramuscular  hematomas. Given 1 unit of PRBCs 06/18/16 with post transfusion hemoglobin of 7.9.Repeat hemoglobin stable at 8.2. Anemia panel showed low B-12 at 167. Given B-12 supplement 06/18/16. Hemoglobin dropped to 7.2 overnight. Hold aspirin and Plavix. Will give another unit of PRBCs today.   Family Communication/Anticipated D/C date and plan/Code Status   DVT prophylaxis: SCDs. Code Status: Full Code.  Family Communication: No family present.  Patient declines my offer to call family. Disposition Plan: Lives alone. Will need SNF placement.   Medical Consultants:    Orthopedic Surgery   Procedures:    Intramedullary fixation of left intertrochanteric femur fracture 06/16/16  Anti-Infectives:    None  Subjective:   The patient reports that his left leg is sore, pain is rated 10/10 and is dull in quality, pain med brings it down to a 7/10. Review of symptoms is negative for back pain, dyspnea and nausea. Bowels moved this morning.  Appetite is "ok".  Objective:    Vitals:   06/18/16 0529 06/18/16 1531 06/18/16 2036 06/19/16 0531  BP: (!) 153/71 (!) 162/65 119/62 (!) 148/65  Pulse: 75 72 65 72  Resp: 18 17 16 16   Temp: 98.6 F (37 C) 98.1 F (36.7 C) 99.7 F (37.6 C) 99 F (37.2 C)  TempSrc: Oral Oral Oral Oral  SpO2: 97% 100% 95% 97%    Intake/Output Summary (Last 24 hours) at 06/19/16 9371 Last data filed at 06/19/16 6967  Gross per 24 hour  Intake          2431.25 ml  Output             1975 ml  Net           456.25 ml   There were no vitals filed for this visit.  Exam:  General exam: Appears calm and comfortable.  Respiratory system: Clear to auscultation. Respiratory effort normal. Cardiovascular system: S1 & S2 heard, RRR. No JVD,  rubs, gallops or clicks. No murmurs. Gastrointestinal system: Abdomen is nondistended, soft and nontender. No organomegaly or masses felt. Normal bowel sounds heard. Central nervous system: Alert and oriented. No focal neurological  deficits. Extremities: No clubbing,  or cyanosis. Left leg swollen compared to right. SCDs on. Skin: No rashes, lesions or ulcers. Psychiatry: Judgement and insight appear normal. Mood & affect appropriate.   Data Reviewed:   I have personally reviewed following labs and imaging studies:  Labs: Basic Metabolic Panel:  Recent Labs Lab 06/15/16 0511 06/15/16 0521 06/17/16 0450 06/18/16 0530 06/19/16 0421  NA 135 136 137 134* 139  K 4.6 5.0 3.8 3.8 3.6  CL 104 102 108 107 109  CO2 22  --  23 19* 24  GLUCOSE 492* 477* 38* 211* 45*  BUN 30* 34* 17 15 13   CREATININE 2.09* 2.00* 1.28* 1.35* 1.22  CALCIUM 8.6*  --  8.3* 7.9* 8.2*   GFR CrCl cannot be calculated (Unknown ideal weight.). Liver Function Tests:  Recent Labs Lab 06/15/16 0511 06/17/16 0450 06/18/16 0530  AST 19 20 16   ALT 13* 11* 8*  ALKPHOS 70 56 54  BILITOT 1.0 0.3 1.0  PROT 6.9 5.8* 5.0*  ALBUMIN 3.8 2.8* 2.4*   Coagulation profile  Recent Labs Lab 06/15/16 0511  INR 0.97    CBC:  Recent Labs Lab 06/15/16 0534 06/17/16 0450 06/17/16 2039 06/18/16 0530 06/18/16 1110 06/19/16 0421  WBC 8.1 7.1 7.3 7.0 6.6 6.7  NEUTROABS 6.0  --   --   --   --   --   HGB 8.9* 8.1* 7.1* 7.9* 8.2* 7.3*  HCT 27.1* 25.4* 21.4* 24.0* 24.9* 22.5*  MCV 89.1 92.0 91.1 89.9 89.9 89.6  PLT 186 145* 143* 137* 152 166   CBG:  Recent Labs Lab 06/18/16 1128 06/18/16 1618 06/18/16 2125 06/19/16 0654 06/19/16 0709  GLUCAP 191* 199* 80 54* 92   Anemia work up:  Recent Labs  06/16/16 1459  VITAMINB12 167*  FOLATE 31.4  FERRITIN 177  TIBC 216*  IRON 16*  RETICCTPCT 1.3   Sepsis Labs:  Recent Labs Lab 06/17/16 2039 06/18/16 0530 06/18/16 1110 06/19/16 0421  WBC 7.3 7.0 6.6 6.7    Microbiology Recent Results (from the past 240 hour(s))  Surgical pcr screen     Status: Abnormal   Collection Time: 06/15/16  7:14 PM  Result Value Ref Range Status   MRSA, PCR NEGATIVE NEGATIVE Final    Staphylococcus aureus POSITIVE (A) NEGATIVE Final    Comment:        The Xpert SA Assay (FDA approved for NASAL specimens in patients over 52 years of age), is one component of a comprehensive surveillance program.  Test performance has been validated by [facility] for patients greater than or equal to 42 year old. It is not intended to diagnose infection nor to guide or monitor treatment.     Radiology: Ct Abdomen Pelvis Wo Contrast  Result Date: 06/17/2016 CLINICAL DATA:  [place] yesterday resulting in LEFT femur fracture. Drop in hemoglobin. Assess for retroperitoneal hemorrhage. History of hypertension and diabetes. EXAM: CT ABDOMEN AND PELVIS WITHOUT CONTRAST TECHNIQUE: Multidetector CT imaging of the abdomen and pelvis was performed following the standard protocol without IV contrast. COMPARISON:  Pelvic radiograph June 16, 2016 FINDINGS: LUNG BASES: Bibasilar atelectasis. Heart size is mildly enlarged. Partially imaged  coronary artery calcifications. No pericardial effusion. KIDNEYS/BLADDER: Kidneys are orthotopic, demonstrating normal size and morphology. No nephrolithiasis, hydronephrosis; limited assessment for renal masses on this nonenhanced examination. 11 mm LEFT lower pole cyst. 14 mm LEFT interpolar cyst. The unopacified ureters are normal in course and caliber. Urinary bladder is well distended and unremarkable. SOLID ORGANS: The liver, gallbladder, pancreas are unremarkable for this non-contrast examination. Trace splenic calcifications. Thickened adrenal glands without dominant nodule most compatible with hyperplasia. GASTROINTESTINAL TRACT: The stomach, small and large bowel are normal in course and caliber without inflammatory changes, the sensitivity may be decreased by lack of enteric contrast. Normal appendix. PERITONEUM/RETROPERITONEUM: Aortoiliac vessels are normal in course and caliber, mild calcific atherosclerosis. No lymphadenopathy by CT size criteria. Prostate  obscured by streak artifact. No definite intraperitoneal free fluid or free air. Trace retroperitoneal free fluid/hemorrhage. SOFT TISSUES/ OSSEOUS STRUCTURES: Mildly enlarged LEFT iliopsoas and LEFT piriformis muscles. RIGHT piriformis muscle atrophy. Enlarged LEFT pelvic muscles most compatible with intramuscular hematoma. Bilateral flank subcutaneous fat stranding. Small amount of gas about the LEFT hip consistent with recent surgery for femur fracture. Small to moderate fat containing umbilical hernia. Severe L4-5 and L5-S1 degenerative discs with broad-based disc osteophyte complexes resulting in moderate canal stenosis L5-S1 and RIGHT lateral recess effacement which may affect the traversing RIGHT S1 nerve. Severe L4-5 and L5-S1 neural foraminal narrowing. IMPRESSION: Trace retroperitoneal hemorrhage with LEFT iliopsoas and LEFT piriformis intramuscular hematomas. Status post recent LEFT femur fracture ORIF. Electronically Signed   By: Elon Alas M.D.   On: 06/17/2016 17:18    Medications:   . amLODipine  10 mg Oral Daily  . aspirin EC  81 mg Oral BID WC  . atorvastatin  10 mg Oral q1800  . carvedilol  6.25 mg Oral BID WC  . Chlorhexidine Gluconate Cloth  6 each Topical Daily  . clopidogrel  75 mg Oral Daily  . docusate sodium  100 mg Oral BID  . insulin aspart  0-15 Units Subcutaneous TID WC  . insulin aspart  0-5 Units Subcutaneous QHS  . insulin aspart  4 Units Subcutaneous TID WC  . insulin glargine  20 Units Subcutaneous Daily  . mupirocin ointment  1 application Nasal BID  . ramipril  10 mg Oral Daily  . senna  1 tablet Oral BID  . thiamine  100 mg Oral Daily   Continuous Infusions: . sodium chloride 75 mL/hr at 06/19/16 0327    Medical decision making is highly complex with several minor problems, stable established problem, and 1 new problems (hypoglycemia) with high complexity decision making making this a level III visit.    LOS: 4 days   [place]  [facility] Pager [phone]. If unable to reach me by pager, please call my cell phone at [phone].  *Please refer to [email or web address], password [email or web address] to get updated schedule on who will round on this patient, as hospitalists switch teams weekly. If 7PM-7AM, please contact night-coverage at [email or web address], password [email or web address] for any overnight needs.  06/19/2016, 8:22 AM

## 2016-06-19 NOTE — Progress Notes (Signed)
   Subjective:  Patient reports pain as mild to moderate.  No c/o.  Objective:   VITALS:   Vitals:   06/18/16 0529 06/18/16 1531 06/18/16 2036 06/19/16 0531  BP: (!) 153/71 (!) 162/65 119/62 (!) 148/65  Pulse: 75 72 65 72  Resp: 18 17 16 16   Temp: 98.6 F (37 C) 98.1 F (36.7 C) 99.7 F (37.6 C) 99 F (37.2 C)  TempSrc: Oral Oral Oral Oral  SpO2: 97% 100% 95% 97%    ABD soft Sensation intact distally Intact pulses distally Dorsiflexion/Plantar flexion intact Incision: dressing C/D/I Compartment soft   Lab Results  Component Value Date   WBC 6.7 06/19/2016   HGB 7.3 (L) 06/19/2016   HCT 22.5 (L) 06/19/2016   MCV 89.6 06/19/2016   PLT 166 06/19/2016   BMET    Component Value Date/Time   NA 139 06/19/2016 0421   K 3.6 06/19/2016 0421   CL 109 06/19/2016 0421   CO2 24 06/19/2016 0421   GLUCOSE 45 (L) 06/19/2016 0421   BUN 13 06/19/2016 0421   CREATININE 1.22 06/19/2016 0421   CALCIUM 8.2 (L) 06/19/2016 0421   GFRNONAA 58 (L) 06/19/2016 0421   GFRAA >60 06/19/2016 0421     Assessment/Plan: 3 Days Post-Op   Principal Problem:   Closed comminuted intertrochanteric fracture of left femur ([name]) Active Problems:   CKD (chronic kidney disease) stage 3, GFR 30-59 ml/min   Uncontrolled type 2 diabetes mellitus with diabetic nephropathy, with long-term current use of insulin ([name])   Benign essential HTN   Dyslipidemia associated with type 2 diabetes mellitus ([name])   Acute on chronic renal failure ([name])   History of CVA (cerebrovascular accident)   Renal insufficiency   Elective surgery   Postop check   Postoperative anemia due to acute blood loss   Retroperitoneal hematoma   Vitamin B 12 deficiency   TDWB LLE PO pain control DVT ppx: plavix and ASA PT/OT D/C planning, SNF placement   Shavonna Corella, Horald Pollen 06/19/2016, 7:59 AM   Rod Can, MD Cell [phone]

## 2016-06-19 NOTE — Plan of Care (Signed)
Problem: Education: Goal: Knowledge of [place] [facility] Education information/materials will improve Outcome: Progressing POC reviewed with pt.   

## 2016-06-19 NOTE — Progress Notes (Signed)
Inpatient Diabetes Program Recommendations  AACE/ADA: New Consensus Statement on Inpatient Glycemic Control (2015)  Target Ranges:  Prepandial:   less than 140 mg/dL      Peak postprandial:   less than 180 mg/dL (1-2 hours)      Critically ill patients:  140 - 180 mg/dL   Lab Results  Component Value Date   GLUCAP 361 (H) 06/19/2016   HGBA1C 9.5 (H) 05/09/2016    Review of Glycemic Control:  Results for TRACI, PLEMONS (MRN [id number]) as of 06/19/2016 12:24  Ref. Range 06/18/2016 16:18 06/18/2016 21:25 06/19/2016 06:54 06/19/2016 07:09 06/19/2016 11:20  Glucose-Capillary Latest Ref Range: 65 - 99 mg/dL 199 (H) 80 54 (L) 92 361 (H)   Diabetes history: Type 1 diabetes Outpatient Diabetes medications: Novolog 70/30 mix- 20 units bid Current orders for Inpatient glycemic control:  Lantus 10 units daily (just reduced), Novolog moderate tid with meals and HS, Novolog 4 units tid with meals (hold if patient eats less than 50%)  Inpatient Diabetes Program Recommendations:   Consider increasing Lantus to 15 units daily.  Also consider reducing Novolog correction to sensitive tid with meals and HS.   Thanks, Adah Perl, RN, BC-ADM Inpatient Diabetes Coordinator Pager [phone] (8a-5p)

## 2016-06-20 ENCOUNTER — Encounter (HOSPITAL_COMMUNITY): Payer: Self-pay

## 2016-06-20 LAB — TYPE AND SCREEN
ABO/RH(D): B POS
Antibody Screen: NEGATIVE
Unit division: 0
Unit division: 0

## 2016-06-20 LAB — GLUCOSE, CAPILLARY
Glucose-Capillary: 119 mg/dL — ABNORMAL HIGH (ref 65–99)
Glucose-Capillary: 163 mg/dL — ABNORMAL HIGH (ref 65–99)
Glucose-Capillary: 207 mg/dL — ABNORMAL HIGH (ref 65–99)
Glucose-Capillary: 209 mg/dL — ABNORMAL HIGH (ref 65–99)
Glucose-Capillary: 210 mg/dL — ABNORMAL HIGH (ref 65–99)
Glucose-Capillary: 249 mg/dL — ABNORMAL HIGH (ref 65–99)
Glucose-Capillary: 277 mg/dL — ABNORMAL HIGH (ref 65–99)
Glucose-Capillary: 292 mg/dL — ABNORMAL HIGH (ref 65–99)
Glucose-Capillary: 344 mg/dL — ABNORMAL HIGH (ref 65–99)
Glucose-Capillary: 68 mg/dL (ref 65–99)
Glucose-Capillary: 97 mg/dL (ref 65–99)

## 2016-06-20 LAB — BASIC METABOLIC PANEL
Anion gap: 8 (ref 5–15)
BUN: 11 mg/dL (ref 6–20)
CO2: 24 mmol/L (ref 22–32)
Calcium: 8.1 mg/dL — ABNORMAL LOW (ref 8.9–10.3)
Chloride: 108 mmol/L (ref 101–111)
Creatinine, Ser: 1.14 mg/dL (ref 0.61–1.24)
GFR calc Af Amer: >60 mL/min (ref >60)
GFR calc non Af Amer: >60 mL/min (ref >60)
Glucose, Bld: 75 mg/dL (ref 65–99)
Potassium: 3.8 mmol/L (ref 3.5–5.1)
Sodium: 140 mmol/L (ref 135–145)

## 2016-06-20 LAB — CBC
HCT: 26.8 % — ABNORMAL LOW (ref 39.0–52.0)
Hemoglobin: 8.7 g/dL — ABNORMAL LOW (ref 13.0–17.0)
MCH: 29.5 pg (ref 26.0–34.0)
MCHC: 32.5 g/dL (ref 30.0–36.0)
MCV: 90.8 fL (ref 78.0–100.0)
Platelets: 189 10*3/uL (ref 150–400)
RBC: 2.95 MIL/uL — ABNORMAL LOW (ref 4.22–5.81)
RDW: 14.7 % (ref 11.5–15.5)
WBC: 6.3 10*3/uL (ref 4.0–10.5)

## 2016-06-20 MED ORDER — CYANOCOBALAMIN 1000 MCG/ML IJ SOLN: 1000.0000 ug | INTRAMUSCULAR | 0 refills | Status: DC

## 2016-06-20 MED ORDER — INSULIN GLARGINE 100 UNIT/ML ~~LOC~~ SOLN: 8.0000 [IU] | Freq: Every day | SUBCUTANEOUS | 11 refills | Status: DC

## 2016-06-20 MED ORDER — POLYETHYLENE GLYCOL 3350 17 G PO PACK: 17.0000 g | PACK | Freq: Every day | ORAL | 0 refills | Status: DC | PRN

## 2016-06-20 MED ORDER — OXYCODONE HCL 5 MG PO TABS: 5.0000 mg | ORAL_TABLET | ORAL | 0 refills | Status: DC | PRN

## 2016-06-20 MED ORDER — INSULIN ASPART 100 UNIT/ML ~~LOC~~ SOLN: 0.0000 [IU] | Freq: Three times a day (TID) | SUBCUTANEOUS | 11 refills | Status: DC

## 2016-06-20 MED ORDER — SENNA 8.6 MG PO TABS: 1.0000 | ORAL_TABLET | Freq: Two times a day (BID) | ORAL | 0 refills | Status: DC

## 2016-06-20 NOTE — Progress Notes (Signed)
   Subjective:  Patient reports pain as mild to moderate.   Objective:   VITALS:   Vitals:   06/19/16 1245 06/19/16 1548 06/19/16 2007 06/20/16 0531  BP: (!) 154/75 (!) 122/54 119/62 124/78  Pulse: 79 72 70 72  Resp: 18 16 16 16   Temp: 99.4 F (37.4 C) 99.2 F (37.3 C) 99.1 F (37.3 C) 98.1 F (36.7 C)  TempSrc: Axillary Oral Oral Oral  SpO2: 95% 96% 96% 96%    ABD soft Sensation intact distally Intact pulses distally Dorsiflexion/Plantar flexion intact Incision: dressing C/D/I Compartment soft   Lab Results  Component Value Date   WBC 6.3 06/20/2016   HGB 8.7 (L) 06/20/2016   HCT 26.8 (L) 06/20/2016   MCV 90.8 06/20/2016   PLT 189 06/20/2016   BMET    Component Value Date/Time   NA 140 06/20/2016 0454   K 3.8 06/20/2016 0454   CL 108 06/20/2016 0454   CO2 24 06/20/2016 0454   GLUCOSE 75 06/20/2016 0454   BUN 11 06/20/2016 0454   CREATININE 1.14 06/20/2016 0454   CALCIUM 8.1 (L) 06/20/2016 0454   GFRNONAA >60 06/20/2016 0454   GFRAA >60 06/20/2016 0454     Assessment/Plan: 4 Days Post-Op   Principal Problem:   Closed comminuted intertrochanteric fracture of left femur ([name]) Active Problems:   CKD (chronic kidney disease) stage 3, GFR 30-59 ml/min   Uncontrolled type 2 diabetes mellitus with diabetic nephropathy, with long-term current use of insulin ([name])   Benign essential HTN   Dyslipidemia associated with type 2 diabetes mellitus ([place])   Acute on chronic renal failure ([name])   History of CVA (cerebrovascular accident)   Renal insufficiency   Elective surgery   Postop check   Postoperative anemia due to acute blood loss   Retroperitoneal hematoma   Vitamin B 12 deficiency   TDWB LLE PO pain control DVT ppx: plavix and ASA PT/OT D/C planning, SNF placement   Nihira Puello, Horald Pollen 06/20/2016, 7:47 AM   Rod Can, MD Cell [phone]

## 2016-06-20 NOTE — Clinical Social Work Placement (Signed)
   CLINICAL SOCIAL WORK PLACEMENT  NOTE  Date:  06/20/2016  Patient Details  Name: [name] [name] MRN: [id number] Date of Birth: 01/13/1946  Clinical Social Work is seeking post-discharge placement for this patient at the [place] level of care (*CSW will initial, date and re-position this form in  chart as items are completed):  Yes   Patient/family provided with [facility] Work Department's list of facilities offering this level of care within the geographic area requested by the patient (or if unable, by the patient's family).  Yes   Patient/family informed of their freedom to choose among providers that offer the needed level of care, that participate in Medicare, Medicaid or managed care program needed by the patient, have an available bed and are willing to accept the patient.  Yes   Patient/family informed of [facility]'s ownership interest in [facility] and [facility], as well as of the fact that they are under no obligation to receive care at these facilities.  PASRR submitted to EDS on       PASRR number received on       Existing PASRR number confirmed on       FL2 transmitted to all facilities in geographic area requested by pt/family on       FL2 transmitted to all facilities within larger geographic area on       Patient informed that his/her managed care company has contracts with or will negotiate with certain facilities, including the following:        Yes   Patient/family informed of bed offers received.  Patient [facility] bed at  [facility])     Physician recommends and patient chooses bed at      Patient to be transferred to  [facility]) on 06/20/16.  Patient to be transferred to facility by       Patient family notified on 06/20/16 of transfer.  Name of family member notified:        PHYSICIAN       Additional Comment:    _______________________________________________ Bernita Buffy 06/20/2016, 4:43 PM

## 2016-06-20 NOTE — Discharge Summary (Addendum)
Physician Discharge Summary  [name] [name] AJG:[id number] DOB: 13-Mar-1946 DOA: 06/15/2016  PCP: Mauricio Po, [name]  Admit date: 06/15/2016 Discharge date: 06/21/2016  Admitted From: Home Discharge disposition: SNF   Recommendations for Outpatient Follow-Up:   1. May resume ASA 81 mg daily and Plavix 75 mg daily on 06/26/16 for DVT prophylaxis if no further drop in hemoglobin. 2. Please check H&H in 3 days to ensure stability. 3. Will need close F/U of DM and adjustment of insulin for brittle diabetes.   Discharge Diagnosis:   Principal Problem:    Closed comminuted intertrochanteric fracture of left femur ([name]) Active Problems:    CKD (chronic kidney disease) stage 3, GFR 30-59 ml/min    Uncontrolled type 2 diabetes mellitus with diabetic nephropathy, with long-term current use of insulin ([name])    Benign essential HTN    Dyslipidemia associated with type 2 diabetes mellitus ([name])    Acute on chronic renal failure ([name])    History of CVA (cerebrovascular accident)    Renal insufficiency    Elective surgery    Postop check    Postoperative anemia due to acute blood loss    Retroperitoneal hematoma    Vitamin B 12 deficiency    Discharge Condition: Improved.  Diet recommendation: Low sodium, heart healthy.  Carbohydrate-modified.    History of Present Illness:   [name] [name] is an 70 y.o. male with medical history significant for insulin-dependent diabetes mellitus, hypertension, hyperlipidemia, ischemic CVA in 2016, and chronic kidney disease stage III who was admitted 06/15/16 after a fall, resulting in a closed left subtrochanteric femur fracture. CBG was 517, when checked by EMS.  Hospital Course by Problem:   Principal Problem:   Closed comminuted intertrochanteric fracture of left femur ([name]) resulting from a fall Underwent intramedullary fixation of left intertrochanteric femur fracture by Dr. Delfino Lovett on 06/16/16. TDWB LLE.  Aspirin/Plavix recommended for DVT  prophylaxis (holding x 1 week secondary to retroperitoneal hematoma). PT/OT evaluations performed. Will need SNF placement.  Active Problems:   Hypertension Continue Norvasc, Coreg and ramipril. BP stable.    Acute kidney injury/Stage III chronic kidney disease Baseline creatinine is around 1.18, current creatinine down to 1.14 after reaching a high of 2.09 on admission.     Type 2 diabetes, brittle/hypoglycemia Currently being managed with Lantus 8 units daily and moderate scale SSI with meal coverage. Had an additional hypoglycemic episode overnight despite reducing Lantus yesterday. SSI changed to sensitive scale. At bedtime coverage discontinued. Meal coverage discontinued. Will need close F/U at SNF to ensure glycemic control.    Dyslipidemia associated with type 2 diabetes Continue atorvastatin.    History of CVA Hold aspirin & Plavix secondary to retroperitoneal bleeding.  Control BP/lipids/DM.     Acute on chronic anemia/postoperative anemia secondary to retroperitoneal hemorrhage/B 12 deficiency Hemoglobin has trended down post surgery. CT of the abdomen and pelvis done 06/17/16 which showed a trace retroperitoneal hemorrhage with left iliopsoas and left piriformis intramuscular hematomas. Given 1 unit of PRBCs 06/18/16 with post transfusion hemoglobin of 7.9.Repeat hemoglobin stable at 8.2. Anemia panel showed low B-12 at 167. Given B-12 supplement 06/18/16. Hemoglobin dropped to 7.2 04/18/16 so aspirin/Plavix placed on hold and he was given another unit of PRBCs. Hemoglobin now stable.  Medical Consultants:    Orthopedic Surgery   Discharge Exam:   Vitals:   06/20/16 1952 06/21/16 0751  BP: (!) 159/85 (!) 167/77  Pulse: 69 87  Resp: 17   Temp: 98.4 F (36.9 C) 99 F (37.2  C)   Vitals:   06/20/16 1057 06/20/16 1409 06/20/16 1952 06/21/16 0751  BP: (!) 124/27 133/63 (!) 159/85 (!) 167/77  Pulse:  72 69 87  Resp:  17 17   Temp:  98.3 F (36.8 C) 98.4 F (36.9 C) 99  F (37.2 C)  TempSrc:  Oral Oral Oral  SpO2:  99% 98% 96%   General exam: Appears calm and comfortable.  Respiratory system: Clear to auscultation. Respiratory effort normal. Cardiovascular system: S1 & S2 heard, RRR. No JVD,  rubs, gallops or clicks. No murmurs. Gastrointestinal system: Abdomen is nondistended, soft and nontender. No organomegaly or masses felt. Normal bowel sounds heard. Central nervous system: Alert and oriented. No focal neurological deficits. Extremities: No clubbing,  or cyanosis. Left leg swollen compared to right. SCDs on. Skin: No rashes, lesions or ulcers. Psychiatry: Judgement and insight appear normal. Mood & affect appropriate.    The results of significant diagnostics from this hospitalization (including imaging, microbiology, ancillary and laboratory) are listed below for reference.     Procedures and Diagnostic Studies:   Dg Chest 1 View  Result Date: 06/15/2016 CLINICAL DATA:  Acute onset of left hip pain and deformity after fall. Initial encounter. EXAM: CHEST 1 VIEW COMPARISON:  Chest radiograph performed 05/15/2016 FINDINGS: The lungs are well-aerated and clear. There is no evidence of focal opacification, pleural effusion or pneumothorax. The cardiomediastinal silhouette is within normal limits. No acute osseous abnormalities are seen. IMPRESSION: No acute cardiopulmonary process seen. No displaced rib fractures identified. Electronically Signed   By: Garald Balding M.D.   On: 06/15/2016 06:20   Ct Head Wo Contrast  Result Date: 06/15/2016 CLINICAL DATA:  Status post fall, hitting head. Concern for head or cervical spine injury. Initial encounter. EXAM: CT HEAD WITHOUT CONTRAST CT CERVICAL SPINE WITHOUT CONTRAST TECHNIQUE: Multidetector CT imaging of the head and cervical spine was performed following the standard protocol without intravenous contrast. Multiplanar CT image reconstructions of the cervical spine were also generated. COMPARISON:  CT of the  head performed 05/15/2016, and CT of the cervical spine performed 08/07/2015. MRI of the brain performed 07/07/2015 FINDINGS: CT HEAD FINDINGS There is no evidence of acute infarction, mass lesion, or intra- or extra-axial hemorrhage on CT. Prominence of the ventricles and sulci reflects mild cortical volume loss. Mild cerebellar atrophy is noted. Scattered periventricular and subcortical white matter change likely reflects small vessel ischemic microangiopathy. The brainstem and fourth ventricle are within normal limits. The basal ganglia are unremarkable in appearance. The cerebral hemispheres demonstrate grossly normal gray-white differentiation. No mass effect or midline shift is seen. There is no evidence of fracture; visualized osseous structures are unremarkable in appearance. The visualized portions of the orbits are within normal limits. There is mild partial opacification of the left maxillary sinus. The remaining paranasal sinuses and mastoid air cells are well-aerated. No significant soft tissue abnormalities are seen. CT CERVICAL SPINE FINDINGS There is no evidence of fracture or subluxation. Vertebral bodies demonstrate normal height and alignment. Multilevel disc space narrowing and endplate sclerotic change are noted along the cervical spine, with anterior and posterior disc osteophyte complexes. Prevertebral soft tissues are within normal limits. The thyroid gland is unremarkable in appearance. The visualized lung apices are clear. Minimal calcification is seen at the carotid bifurcations bilaterally. IMPRESSION: 1. No evidence of traumatic intracranial injury or fracture. 2. No evidence of fracture or subluxation along the cervical spine. 3. Mild cortical volume loss and scattered small vessel ischemic microangiopathy. 4. Mild degenerative change  along the cervical spine. 5. Mild partial opacification of the left maxillary sinus. Electronically Signed   By: Garald Balding M.D.   On: 06/15/2016  05:55   Ct Cervical Spine Wo Contrast  Result Date: 06/15/2016 CLINICAL DATA:  Status post fall, hitting head. Concern for head or cervical spine injury. Initial encounter. EXAM: CT HEAD WITHOUT CONTRAST CT CERVICAL SPINE WITHOUT CONTRAST TECHNIQUE: Multidetector CT imaging of the head and cervical spine was performed following the standard protocol without intravenous contrast. Multiplanar CT image reconstructions of the cervical spine were also generated. COMPARISON:  CT of the head performed 05/15/2016, and CT of the cervical spine performed 08/07/2015. MRI of the brain performed 07/07/2015 FINDINGS: CT HEAD FINDINGS There is no evidence of acute infarction, mass lesion, or intra- or extra-axial hemorrhage on CT. Prominence of the ventricles and sulci reflects mild cortical volume loss. Mild cerebellar atrophy is noted. Scattered periventricular and subcortical white matter change likely reflects small vessel ischemic microangiopathy. The brainstem and fourth ventricle are within normal limits. The basal ganglia are unremarkable in appearance. The cerebral hemispheres demonstrate grossly normal gray-white differentiation. No mass effect or midline shift is seen. There is no evidence of fracture; visualized osseous structures are unremarkable in appearance. The visualized portions of the orbits are within normal limits. There is mild partial opacification of the left maxillary sinus. The remaining paranasal sinuses and mastoid air cells are well-aerated. No significant soft tissue abnormalities are seen. CT CERVICAL SPINE FINDINGS There is no evidence of fracture or subluxation. Vertebral bodies demonstrate normal height and alignment. Multilevel disc space narrowing and endplate sclerotic change are noted along the cervical spine, with anterior and posterior disc osteophyte complexes. Prevertebral soft tissues are within normal limits. The thyroid gland is unremarkable in appearance. The visualized lung apices  are clear. Minimal calcification is seen at the carotid bifurcations bilaterally. IMPRESSION: 1. No evidence of traumatic intracranial injury or fracture. 2. No evidence of fracture or subluxation along the cervical spine. 3. Mild cortical volume loss and scattered small vessel ischemic microangiopathy. 4. Mild degenerative change along the cervical spine. 5. Mild partial opacification of the left maxillary sinus. Electronically Signed   By: Garald Balding M.D.   On: 06/15/2016 05:55   Pelvis Portable  Result Date: 06/16/2016 CLINICAL DATA:  70 year old male with a history of left hip fracture repair EXAM: PORTABLE PELVIS 1-2 VIEWS COMPARISON:  06/15/2016 FINDINGS: Bony pelvic ring intact. Surgical changes of right hip arthroplasty, unchanged. Interval ORIF of left hip fracture with anatomic alignment reestablish. No complicating features. IMPRESSION: Early postoperative changes of left hip intratrochanteric fracture ORIF with anatomic alignment reestablished. No complicating features. Surgical changes of right hip arthroplasty. Signed, Dulcy Fanny. Earleen Newport, DO [facility] Electronically Signed   By: Corrie Mckusick D.O.   On: 06/16/2016 13:01   Dg Knee Left Port  Result Date: 06/15/2016 CLINICAL DATA:  Left knee pain today. No known injury. Initial encounter. EXAM: PORTABLE LEFT KNEE - 1-2 VIEW COMPARISON:  None. FINDINGS: No evidence of fracture, dislocation, or joint effusion. No evidence of arthropathy or other focal bone abnormality. Soft tissues are unremarkable. IMPRESSION: Negative exam. Electronically Signed   By: Inge Rise M.D.   On: 06/15/2016 16:42   Dg C-arm 1-60 Min  Result Date: 06/16/2016 CLINICAL DATA:  ORIF left hip EXAM: DG C-ARM 61-120 MIN; LEFT FEMUR 2 VIEWS FLUOROSCOPY TIME:  1 minutes 55 seconds COMPARISON:  Left hip radiographs dated 06/15/2016 FINDINGS: Intraoperative fluoroscopic radiographs during  IM nail with dynamic hip  screw fixation of an intertrochanteric left hip fracture. Fracture fragments are in near anatomic alignment and position. Single distal interlocking screw. IMPRESSION: Intraoperative fluoroscopic radiographs during ORIF of an intertrochanteric left hip fracture, as above. Electronically Signed   By: Julian Hy M.D.   On: 06/16/2016 12:17   Dg Hip Unilat W Or Wo Pelvis 2-3 Views Left  Result Date: 06/15/2016 CLINICAL DATA:  Status post unwitnessed fall, with left hip pain and deformity. Initial encounter. EXAM: DG HIP (WITH OR WITHOUT PELVIS) 2-3V LEFT COMPARISON:  None. FINDINGS: There is a comminuted left femoral intertrochanteric fracture, with mild displacement. The left femoral head remains seated at the acetabulum. The right hip arthroplasty is grossly unremarkable in appearance. No additional fractures are seen. The sacroiliac joints are within normal limits. Mild degenerative change is noted at the lower lumbar spine. Soft tissue swelling is noted about the left hip. IMPRESSION: Comminuted left femoral intertrochanteric fracture, with mild displacement. Electronically Signed   By: Garald Balding M.D.   On: 06/15/2016 06:21   Dg Femur Min 2 Views Left  Result Date: 06/16/2016 CLINICAL DATA:  ORIF left hip EXAM: DG C-ARM 61-120 MIN; LEFT FEMUR 2 VIEWS FLUOROSCOPY TIME:  1 minutes 55 seconds COMPARISON:  Left hip radiographs dated 06/15/2016 FINDINGS: Intraoperative fluoroscopic radiographs during IM nail with dynamic hip screw fixation of an intertrochanteric left hip fracture. Fracture fragments are in near anatomic alignment and position. Single distal interlocking screw. IMPRESSION: Intraoperative fluoroscopic radiographs during ORIF of an intertrochanteric left hip fracture, as above. Electronically Signed   By: Julian Hy M.D.   On: 06/16/2016 12:17   Dg Femur Port Min 2 Views Left  Result Date: 06/16/2016 CLINICAL DATA:  70 year old male with a history of postoperative stricture or  pair left hip. EXAM: LEFT FEMUR PORTABLE 2 VIEWS COMPARISON:  06/15/2016 FINDINGS: Early postoperative changes of left inter trochanteric hip fracture repair with antegrade left femoral intra medullary rod and gamma nail fixation at the fracture site alignment is now anatomic. No complicating features. Single distal interlocking screw. IMPRESSION: Early postoperative changes of left intratrochanteric hip fracture ORIF with antegrade intra medullary rod and gamma nail fixation of the femoral head, with single distal interlocking screw. No complicating features. Signed, Dulcy Fanny. Earleen Newport, DO [facility] Electronically Signed   By: Corrie Mckusick D.O.   On: 06/16/2016 12:59     Labs:   Basic Metabolic Panel:  Recent Labs Lab 06/15/16 0511 06/15/16 0521 06/17/16 0450 06/18/16 0530 06/19/16 0421 06/20/16 0454  NA 135 136 137 134* 139 140  K 4.6 5.0 3.8 3.8 3.6 3.8  CL 104 102 108 107 109 108  CO2 22  --  23 19* 24 24  GLUCOSE 492* 477* 38* 211* 45* 75  BUN 30* 34* '17 15 13 11  ' CREATININE 2.09* 2.00* 1.28* 1.35* 1.22 1.14  CALCIUM 8.6*  --  8.3* 7.9* 8.2* 8.1*   GFR CrCl cannot be calculated (Unknown ideal weight.). Liver Function Tests:  Recent Labs Lab 06/15/16 0511 06/17/16 0450 06/18/16 0530  AST '19 20 16  ' ALT 13* 11* 8*  ALKPHOS 70 56 54  BILITOT 1.0 0.3 1.0  PROT 6.9 5.8* 5.0*  ALBUMIN 3.8 2.8* 2.4*   Coagulation profile  Recent Labs Lab 06/15/16 0511  INR 0.97    CBC:  Recent Labs Lab 06/15/16 0534  06/18/16 0530 06/18/16 1110 06/19/16 0421 06/20/16 0454 06/21/16 0556  WBC 8.1  < >  7.0 6.6 6.7 6.3 7.9  NEUTROABS 6.0  --   --   --   --   --   --   HGB 8.9*  < > 7.9* 8.2* 7.3* 8.7* 9.5*  HCT 27.1*  < > 24.0* 24.9* 22.5* 26.8* 29.0*  MCV 89.1  < > 89.9 89.9 89.6 90.8 90.9  PLT 186  < > 137* 152 166 189 223  < > = values in this interval not displayed.  CBG:  Recent Labs Lab 06/20/16 2108  06/20/16 2302 06/21/16 0025 06/21/16 0254 06/21/16 0724  GLUCAP 209* 207* 235* 286* 352*   Microbiology Recent Results (from the past 240 hour(s))  Surgical pcr screen     Status: Abnormal   Collection Time: 06/15/16  7:14 PM  Result Value Ref Range Status   MRSA, PCR NEGATIVE NEGATIVE Final   Staphylococcus aureus POSITIVE (A) NEGATIVE Final    Comment:        The Xpert SA Assay (FDA approved for NASAL specimens in patients over 64 years of age), is one component of a comprehensive surveillance program.  Test performance has been validated by [facility] for patients greater than or equal to 32 year old. It is not intended to diagnose infection nor to guide or monitor treatment.      Discharge Instructions:   Discharge Instructions    Call MD for:  extreme fatigue    Complete by:  As directed   Call MD for:  persistant dizziness or light-headedness    Complete by:  As directed   Call MD for:  severe uncontrolled pain    Complete by:  As directed   Diet - low sodium heart healthy    Complete by:  As directed   Diet Carb Modified    Complete by:  As directed   Increase activity slowly    Complete by:  As directed       Medication List    STOP taking these medications   alprazolam 2 MG tablet Commonly known as:  XANAX   aspirin EC 325 MG tablet   gabapentin 300 MG capsule Commonly known as:  NEURONTIN   insulin aspart protamine - aspart (70-30) 100 UNIT/ML FlexPen Commonly known as:  NOVOLOG 70/30 MIX   Insulin Pen Needle 31G X 5 MM Misc     TAKE these medications   acetaminophen 325 MG tablet Commonly known as:  TYLENOL Take 2 tablets (650 mg total) by mouth every 6 (six) hours as needed for mild pain (or Fever >/= 101).   amLODipine 10 MG tablet Commonly known as:  NORVASC Take 1 tablet (10 mg total) by mouth daily.   atorvastatin 10 MG tablet Commonly known as:  LIPITOR Take 1 tablet (10 mg total) by mouth daily at 6 PM.   blood glucose meter  kit and supplies Use daily to check blood sugar. DX: E11.09   carvedilol 6.25 MG tablet Commonly known as:  COREG Take 1 tablet (6.25 mg total) by mouth 2 (two) times daily with a meal.   cyanocobalamin 1000 MCG/ML injection Commonly known as:  (VITAMIN B-12) Inject 1 mL (1,000 mcg total) into the muscle every 30 (thirty) days.   feeding supplement (GLUCERNA SHAKE) Liqd Take 237 mLs by mouth 2 (two) times daily between meals.   glucose blood test strip Commonly known as:  COOL BLOOD GLUCOSE TEST STRIPS Use as instructed to check blood sugar up to 3 times a day. DX: E11.09   insulin aspart 100  UNIT/ML injection Commonly known as:  novoLOG Inject 0-9 Units into the skin 3 (three) times daily with meals.   insulin aspart 100 UNIT/ML injection Commonly known as:  novoLOG Inject 3 Units into the skin 3 (three) times daily with meals.   insulin glargine 100 UNIT/ML injection Commonly known as:  LANTUS Inject 0.08 mLs (8 Units total) into the skin daily.   Lancets Misc Use as instructed to check blood sugar up to 3 times a day. DX: E11.09   oxyCODONE 5 MG immediate release tablet Commonly known as:  Oxy IR/ROXICODONE Take 1 tablet (5 mg total) by mouth every 4 (four) hours as needed for moderate pain.   polyethylene glycol packet Commonly known as:  MIRALAX / GLYCOLAX Take 17 g by mouth daily as needed for mild constipation.   ramipril 10 MG capsule Commonly known as:  ALTACE Take 10 mg by mouth daily.   senna 8.6 MG Tabs tablet Commonly known as:  SENOKOT Take 1 tablet (8.6 mg total) by mouth 2 (two) times daily.   thiamine 100 MG tablet Take 1 tablet (100 mg total) by mouth daily.       Contact information for follow-up providers    Swinte[place], Horald Pollen, MD. Schedule an appointment as soon as possible for a visit in 2 week(s).   Specialty:  Orthopedic Surgery Why:  For wound re-check Contact information: [address] [place]  [address] [phone]            Contact information for after-discharge care    [facility] .   Specialty:  [place] information: [address] [place] [phone]                   Time coordinating discharge: > 35 minutes.  Signed:  Marly Schuld  Pager [phone] [facility] 06/21/2016, 9:38 AM

## 2016-06-20 NOTE — Progress Notes (Signed)
SW spoke with Juliann Pulse of [facility] who states that she is still waiting on authorization for patient. She states once she receives the authorization number she will notify the SW in order to send pt.  Tilda Burrow, MSW [phone]

## 2016-06-20 NOTE — Progress Notes (Signed)
   06/20/16 2115  Charting Type  Charting Type Shift assessment  Neurological  Neuro (WDL) X  Level of Consciousness Alert  Orientation Level Oriented X4  Pupil Assessment  No  LLE Sensation Full sensation;No numbness;No tingling  LLE Motor Strength 4  Delirium Risk Factor Assessment  Delirium Risk Factor Assessment Age 71 or greater;Surgery this admission  Confusion Assessment Method (CAM)  SECTION I: Acute onset of mental status change No  SECTION I: Fluctuating course of behavior No  SECTION II: Inattention/difficulty focusing No  SECTION III: Disorganized thinking No  SECTION IV: Altered level of consciousness No  CAM positive? No  HEENT  HEENT (WDL) X  Vision Check No  Teeth Missing (Comment);Poor dental hygiene  Tongue Pink;Moist  Respiratory  Respiratory (WDL) X  Cough None  Respiratory Interventions Incentive spirometry w/ teach back  Bilateral Breath Sounds Clear;Diminished  R Upper  Breath Sounds Clear  L Upper Breath Sounds Clear  R Lower Breath Sounds Diminished  L Lower Breath Sounds Diminished  Cough and Deep Breathe  Cough and Deep Breathe Yes  Cardiac  Cardiac (WDL) WDL  Jugular Venous Distention (JVD) No  ECG Monitor No  Antiarrhythmic device  Antiarrhythmic device No  Vascular  Vascular (WDL) X  Capillary Refill Less than 3 seconds  Pulses R radial;L radial;R dorsalis pedis;L dorsalis pedis  Edema Left lower extremity  LLE Edema Non-Pitting  RUE Neurovascular Assessment  R Radial Pulse +2  LUE Neurovascular Assessment  L Radial Pulse +2  RLE Neurovascular Assessment  R Dorsalis Pedis Pulse +2  LLE Neurovascular Assessment  L Dorsalis Pedis Pulse +1  LLE Capillary Refill  Less than/equal to 3 seconds  LLE Color  Appropriate for ethnicity  LLE Temperature/Moisture  Warm;Dry  Integumentary  Integumentary (WDL) X  Skin Condition Dry;Flaky  Skin Integrity Surgical Incision (see LDA)  Abrasion Location Arm;Leg  Abrasion Location Orientation  Right;Left  Abrasion Intervention (assessed)  Skin Turgor Non-tenting  Braden Scale (Ages 8 and up)  Sensory Perceptions 4  Moisture 3  Activity 2  Mobility 3  Nutrition 3  Friction and Shear 3  Braden Scale Score 18  Braden Interventions  Braden Scale Interventions HOB;Reposition q2h  Patient not compliant with following measures Reposition every 2 hours  Musculoskeletal  Musculoskeletal (WDL) X  Generalized Weakness Yes  Weight Bearing Restrictions Yes  LLE Weight Bearing PWB  Musculoskeletal Details  Left Hip Limited movement;Weakness;Surgery;Swelling  Gastrointestinal  Gastrointestinal (WDL) WDL  Last BM Date 06/19/16  GU Assessment  Genitourinary (WDL) WDL  Genitourinary Symptoms None  Genitalia  Male Genitalia Intact  Urine Characteristics  Urinary Incontinence No  Psychosocial  Psychosocial (WDL) WDL  Wound/Incision (LDAs)  Type of Wound/Incision (LDA) Incision  Incision (Closed) 06/16/16 Thigh Left  Date First Assessed/Time First Assessed: 06/16/16 1152   Location: Thigh  Location Orientation: Left  Dressing Type Silicone dressing (mepilex3)  Site / Wound Assessment Dressing in place / Unable to assess  Drainage Amount None

## 2016-06-20 NOTE — Care Management Important Message (Signed)
Important Message  Patient Details  Name: Labron Bloodgood MRN: [id number] Date of Birth: 06-May-1946   Medicare Important Message Given:  Yes    Nadina Fomby 06/20/2016, 10:40 AM

## 2016-06-20 NOTE — Progress Notes (Signed)
Physical Therapy Treatment Patient Details Name: [name] [name] MRN: [id number] DOB: Oct 20, 1945 Today's Date: 06/20/2016    History of Present Illness Patient is a 70 y/o male with hx of DM, CKD, HTN, depression with multiple admissions who presents after fall at home. Pt now s/p ORIF left femur.    PT Comments    Pt making improvements with mobility during PT session. Able to increase ambulation to 20 feet with rw but decreasing stability and consistency with weightbearing status with fatigue. Continue to recommend SNF for further rehabilitation following acute stay.   Follow Up Recommendations  SNF;Supervision for mobility/OOB     Equipment Recommendations  Rolling walker with 5" wheels    Recommendations for Other Services       Precautions / Restrictions Precautions Precautions: Fall Restrictions Weight Bearing Restrictions: Yes LLE Weight Bearing: Partial weight bearing LLE Partial Weight Bearing Percentage or Pounds: 30    Mobility  Bed Mobility Overal bed mobility: Needs Assistance Bed Mobility: Supine to Sit     Supine to sit: Min assist     General bed mobility comments: assist provided with LLE  Transfers Overall transfer level: Needs assistance Equipment used: Rolling walker (2 wheeled) Transfers: Sit to/from Stand Sit to Stand: Min assist         General transfer comment: reminder for hand placement  Ambulation/Gait Ambulation/Gait assistance: Min guard Ambulation Distance (Feet): 20 Feet Assistive device: Rolling walker (2 wheeled) Gait Pattern/deviations:  (swing-to pattern) Gait velocity: decreased   General Gait Details: Pt consistent with weightbearing restriction, cues to not overstride with Rt. Increased difficulty to maintain weightbearing restrictions with fatigue.    Stairs            Wheelchair Mobility    Modified Rankin (Stroke Patients Only)       Balance Overall balance assessment: Needs assistance Sitting-balance  support: No upper extremity supported Sitting balance-Leahy Scale: Good     Standing balance support: Bilateral upper extremity supported Standing balance-Leahy Scale: Poor Standing balance comment: using rw                    Cognition Arousal/Alertness: Awake/alert Behavior During Therapy: WFL for tasks assessed/performed Overall Cognitive Status: Within Functional Limits for tasks assessed                      Exercises Total Joint Exercises Ankle Circles/Pumps: AROM;Both;10 reps Quad Sets: Strengthening;Left;10 reps Short Arc Quad: Strengthening;Left;10 reps (min assist) Heel Slides: AAROM;Left;10 reps Hip ABduction/ADduction: Strengthening;Left;10 reps (min assist)    General Comments        Pertinent Vitals/Pain Pain Assessment: 0-10 Pain Score: 8  Pain Location: Lt hip Pain Descriptors / Indicators: Aching Pain Intervention(s): Limited activity within patient's tolerance;Monitored during session;Ice applied    Home Living                      Prior Function            PT Goals (current goals can now be found in the care plan section) Acute Rehab PT Goals Patient Stated Goal: be able to get home PT Goal Formulation: With patient Time For Goal Achievement: 07/01/16 Potential to Achieve Goals: Good Progress towards PT goals: Progressing toward goals    Frequency  Min 3X/week    PT Plan Current plan remains appropriate    Co-evaluation             End of Session Equipment Utilized During Treatment: Gait  belt Activity Tolerance: Patient tolerated treatment well Patient left: in chair;with call bell/phone within reach;with nursing/sitter in room     Time: 1220-1243 PT Time Calculation (min) (ACUTE ONLY): 23 min  Charges:  $Gait Training: 8-22 mins                    G Codes:      Cassell Clement, PT, CSCS Pager [phone] Office [phone]  06/20/2016, 1:49 PM

## 2016-06-20 NOTE — Evaluation (Signed)
Occupational Therapy Evaluation Patient Details Name: [name] [name] MRN: [id number] DOB: [birth year] Today's Date: 06/20/2016    History of Present Illness Patient is a 70 y/o male with hx of DM, CKD, HTN, depression with multiple admissions who presents after fall at home. Pt now s/p ORIF left femur.   Clinical Impression   Pt with improved mobility and ability to participate in ADL. Performed ADL at EOB with set up to moderate assistance. Pt with difficulty maintaining PWB on L LE in standing without B UE support. Instructed in leaning side to side for pericare. Pt reporting 10/10 pain in L hip, returned to bed, RN notified.    Follow Up Recommendations  SNF;Supervision/Assistance - 24 hour    Equipment Recommendations       Recommendations for Other Services       Precautions / Restrictions Precautions Precautions: Fall Restrictions Weight Bearing Restrictions: Yes LLE Weight Bearing: Partial weight bearing LLE Partial Weight Bearing Percentage or Pounds: 30      Mobility Bed Mobility Overal bed mobility: Needs Assistance Bed Mobility: Supine to Sit, Sit to Supine     Supine to sit: Min assist  Sit to Supine: Min assist     General bed mobility comments: assist provided with LLE  Transfers Overall transfer level: Needs assistance Equipment used: Rolling walker (2 wheeled) Transfers: Sit to/from Stand Sit to Stand: Min assist         General transfer comment: reminder for hand placement and WB precautions    Balance Overall balance assessment: Needs assistance Sitting-balance support: No upper extremity supported Sitting balance-Leahy Scale: Good     Standing balance support: Bilateral upper extremity supported Standing balance-Leahy Scale: Poor Standing balance comment: unable to maintain standing balance and perform ADL                            ADL Overall ADL's : Needs assistance/impaired     Grooming: Wash/dry hands;Wash/dry  face;Sitting;Supervision/safety   Upper Body Bathing: Supervision/ safety;Sitting   Lower Body Bathing: Moderate assistance;Sit to/from stand   Upper Body Dressing : Set up;Sitting   Lower Body Dressing: Maximal assistance;Sit to/from stand               Functional mobility during ADLs: Minimal assistance;Rolling walker General ADL Comments: pt stating he was ready to go home     Vision     Perception     Praxis      Pertinent Vitals/Pain Pain Assessment: 0-10 Pain Score: 10-Worst pain ever Pain Location: R hip Pain Descriptors / Indicators: Aching;Crying;Guarding;Grimacing Pain Intervention(s): Monitored during session;Limited activity within patient's tolerance;Repositioned;Ice applied;Patient requesting pain meds-RN notified     Hand Dominance     Extremity/Trunk Assessment             Communication     Cognition Arousal/Alertness: Awake/alert Behavior During Therapy: WFL for tasks assessed/performed Overall Cognitive Status: Impaired/Different from baseline Area of Impairment: Safety/judgement;Memory     Memory: Decreased short-term memory   Safety/Judgement: Decreased awareness of safety;Decreased awareness of deficits         General Comments       Exercises       Shoulder Instructions      Home Living  Prior Functioning/Environment               OT Diagnosis:     OT Problem List:     OT Treatment/Interventions:      OT Goals(Current goals can be found in the care plan section) Acute Rehab OT Goals Patient Stated Goal: be able to get home Time For Goal Achievement: 07/01/16 Potential to Achieve Goals: Good  OT Frequency: Min 2X/week   Barriers to D/C:            Co-evaluation              End of Session Equipment Utilized During Treatment: Gait belt;Rolling walker Nurse Communication: Patient requests pain meds  Activity Tolerance: Patient limited  by pain Patient left: in bed;with call bell/phone within reach;with bed alarm set   Time: 1400-1417 OT Time Calculation (min): 17 min Charges:  OT General Charges $OT Visit: 1 Procedure OT Treatments $Self Care/Home Management : 8-22 mins G-Codes:    [name] [name] 06/20/2016, 2:23 PM  [phone]

## 2016-06-20 NOTE — Progress Notes (Signed)
PASRR: [id number]

## 2016-06-20 NOTE — Plan of Care (Signed)
Problem: Pain Management: Goal: Pain level will decrease Outcome: Progressing Pt. is asking for pain med more; explained to pt. that if if he has decrease pain then this will aide him in moving more.

## 2016-06-20 NOTE — Clinical Social Work Note (Signed)
Clinical Social Work Assessment  Patient Details  Name: [name] [name] MRN: [id number] Date of Birth: 16-Aug-1946  Date of referral:  06/20/16               Reason for consult:  Facility Placement                Permission sought to share information with:  Facility Art therapist granted to share information::   (fACILITY)  Name::        Agency::     Relationship::     Contact Information:     Housing/Transportation Living arrangements for the past 2 months:  Single Family Home (Home alone in [place]) Source of Information:  Patient Patient Interpreter Needed:  None Criminal Activity/Legal Involvement Pertinent to Current Situation/Hospitalization:  No - Comment as needed Significant Relationships:  Other(Comment) (Patient states his family is supportive...) Lives with:  Self Do you feel safe going back to the place where you live?   (Patient is interested in SNF) Need for family participation in patient care:  Yes (Comment)  Care giving concerns:  Patient states that he needs assistance with completing ADLs.    Social Worker assessment / plan:  Patient states that he is interested in facility. SW will refer pt to facility. Daughter and pt state that due to him being a pt at [facility] in the past be would like to go back to that SNF. SW spoke with Kathy/Liason who confirms that she can take pt.  Employment status:  Retired Actor.) Insurance information:    PT Recommendations:  [place] / Referral to community resources:   (SNF)  Patient/Family's Response to care:  Appropriate.  Patient/Family's Understanding of and Emotional Response to Diagnosis, Current Treatment, and Prognosis:  No questions.  Emotional Assessment Appearance:  Appears stated age Attitude/Demeanor/Rapport:   (Appropriate.) Affect (typically observed):  Accepting Orientation:  Oriented to Self, Oriented to Place, Oriented to  Time,  Oriented to Situation Alcohol / Substance use:  Not Applicable Psych involvement (Current and /or in the community):  No (Comment)  Discharge Needs  Concerns to be addressed:  No discharge needs identified Readmission within the last 30 days:  Yes Current discharge risk:  None Barriers to Discharge:  No Barriers Identified   Bernita Buffy 06/20/2016, 4:36 PM

## 2016-06-21 DIAGNOSIS — S7222XD Displaced subtrochanteric fracture of left femur, subsequent encounter for closed fracture with routine healing: Secondary | ICD-10-CM

## 2016-06-21 LAB — CBC
HCT: 29 % — ABNORMAL LOW (ref 39.0–52.0)
Hemoglobin: 9.5 g/dL — ABNORMAL LOW (ref 13.0–17.0)
MCH: 29.8 pg (ref 26.0–34.0)
MCHC: 32.8 g/dL (ref 30.0–36.0)
MCV: 90.9 fL (ref 78.0–100.0)
Platelets: 223 10*3/uL (ref 150–400)
RBC: 3.19 MIL/uL — ABNORMAL LOW (ref 4.22–5.81)
RDW: 14.2 % (ref 11.5–15.5)
WBC: 7.9 10*3/uL (ref 4.0–10.5)

## 2016-06-21 LAB — GLUCOSE, CAPILLARY
Glucose-Capillary: 235 mg/dL — ABNORMAL HIGH (ref 65–99)
Glucose-Capillary: 286 mg/dL — ABNORMAL HIGH (ref 65–99)
Glucose-Capillary: 318 mg/dL — ABNORMAL HIGH (ref 65–99)
Glucose-Capillary: 352 mg/dL — ABNORMAL HIGH (ref 65–99)

## 2016-06-21 MED ORDER — INSULIN ASPART 100 UNIT/ML ~~LOC~~ SOLN: 0.0000 [IU] | Freq: Three times a day (TID) | SUBCUTANEOUS | 11 refills | Status: DC

## 2016-06-21 MED ORDER — INSULIN ASPART 100 UNIT/ML ~~LOC~~ SOLN: 3.0000 [IU] | Freq: Three times a day (TID) | SUBCUTANEOUS | 11 refills | Status: DC

## 2016-06-21 MED ORDER — INSULIN ASPART 100 UNIT/ML ~~LOC~~ SOLN
3.0000 [IU] | Freq: Three times a day (TID) | SUBCUTANEOUS | Status: DC
  Administered 2016-06-21: 3 [IU] via SUBCUTANEOUS

## 2016-06-21 MED ORDER — INSULIN ASPART 100 UNIT/ML ~~LOC~~ SOLN
0.0000 [IU] | Freq: Three times a day (TID) | SUBCUTANEOUS | Status: DC
  Administered 2016-06-21: 7 [IU] via SUBCUTANEOUS

## 2016-06-21 NOTE — Clinical Social Work Note (Signed)
CSW informed by RN that pt medically cleared for discharge. CSW contacted Santiago Glad at [facility] and informed that pt discharging today to SNF. Pt authorization still pending and Santiago Glad agreed for pt to be admitted to SNF with LOG. CSW contacted pt daughter and informed pt discharging. CSW contacted [facility] and arranged for pt to be transported to SNF and requested that RN call SNF and give report.

## 2016-06-21 NOTE — Progress Notes (Signed)
     Subjective: 5 Days Post-Op Procedure(s) (LRB): INTRAMEDULLARY (IM) NAIL INTERTROCHANTRIC (Left)   Patient reports pain as mild, states that there is mild pain from time to time. Doing well otherwise. No events throughout the night.  Objective:   VITALS:   Vitals:   06/20/16 1952 06/21/16 0751  BP: (!) 159/85 (!) 167/77  Pulse: 69 87  Resp: 17   Temp: 98.4 F (36.9 C) 99 F (37.2 C)    Dorsiflexion/Plantar flexion intact Incision: dressing C/D/I No cellulitis present Compartment soft  LABS  Recent Labs  06/19/16 0421 06/20/16 0454 06/21/16 0556  HGB 7.3* 8.7* 9.5*  HCT 22.5* 26.8* 29.0*  WBC 6.7 6.3 7.9  PLT 166 189 223     Recent Labs  06/19/16 0421 06/20/16 0454  NA 139 140  K 3.6 3.8  BUN 13 11  CREATININE 1.22 1.14  GLUCOSE 45* 75     Assessment/Plan: 5 Days Post-Op Procedure(s) (LRB): INTRAMEDULLARY (IM) NAIL INTERTROCHANTRIC (Left)   Up with therapy  Orthopaedically doing well   West Pugh. Tanecia Mccay   PAC  06/21/2016, 9:25 AM

## 2016-06-21 NOTE — Discharge Summary (Signed)
Physician Discharge Summary  Gadge Hermiz ACZ:[id number] DOB: [birth year] DOA: 06/15/2016  PCP: Mauricio Po, [name]  Admit date: 06/15/2016 Discharge date: 06/21/2016  Admitted From: Home Discharge disposition: SNF   Recommendations for Outpatient Follow-Up:   1. May resume ASA 81 mg daily and Plavix 75 mg daily on 06/26/16 for DVT prophylaxis if no further drop in hemoglobin. 2. Please check H&H in 3 days to ensure stability. 3. Will need close F/U of DM and adjustment of insulin for brittle diabetes.   Discharge Diagnosis:   Principal Problem:    Closed comminuted intertrochanteric fracture of left femur ([name]) Active Problems:    CKD (chronic kidney disease) stage 3, GFR 30-59 ml/min    Uncontrolled type 2 diabetes mellitus with diabetic nephropathy, with long-term current use of insulin ([name])    Benign essential HTN    Dyslipidemia associated with type 2 diabetes mellitus ([name])    Acute on chronic renal failure ([name])    History of CVA (cerebrovascular accident)    Renal insufficiency    Elective surgery    Postop check    Postoperative anemia due to acute blood loss    Retroperitoneal hematoma    Vitamin B 12 deficiency    Discharge Condition: Improved.  Diet recommendation: Low sodium, heart healthy.  Carbohydrate-modified.    History of Present Illness:   Krishang Reading is an 70 y.o. male with medical history significant for insulin-dependent diabetes mellitus, hypertension, hyperlipidemia, ischemic CVA in 2016, and chronic kidney disease stage III who was admitted 06/15/16 after a fall, resulting in a closed left subtrochanteric femur fracture. CBG was 517, when checked by EMS.  Hospital Course by Problem:   Principal Problem:   Closed comminuted intertrochanteric fracture of left femur ([name]) resulting from a fall Underwent intramedullary fixation of left intertrochanteric femur fracture by Dr. Delfino Lovett on 06/16/16. TDWB LLE.  Aspirin/Plavix recommended for DVT  prophylaxis (holding x 1 week secondary to retroperitoneal hematoma). PT/OT evaluations performed. Will need SNF placement.  Active Problems:   Hypertension Continue Norvasc, Coreg and ramipril. BP stable.    Acute kidney injury/Stage III chronic kidney disease Baseline creatinine is around 1.18, current creatinine down to 1.14 after reaching a high of 2.09 on admission.     Type 2 diabetes, brittle/hypoglycemia Currently being managed with Lantus 8 units daily and insulin sensitive SSI with 3 units of meal coverage. Will need close F/U at SNF to ensure glycemic control.    Dyslipidemia associated with type 2 diabetes Continue atorvastatin.    History of CVA Hold aspirin & Plavix secondary to retroperitoneal bleeding.  Control BP/lipids/DM.     Acute on chronic anemia/postoperative anemia secondary to retroperitoneal hemorrhage/B 12 deficiency Hemoglobin has trended down post surgery. CT of the abdomen and pelvis done 06/17/16 which showed a trace retroperitoneal hemorrhage with left iliopsoas and left piriformis intramuscular hematomas. Given 1 unit of PRBCs 06/18/16 with post transfusion hemoglobin of 7.9.Repeat hemoglobin stable at 8.2. Anemia panel showed low B-12 at 167. Given B-12 supplement 06/18/16. Hemoglobin dropped to 7.2 04/18/16 so aspirin/Plavix placed on hold and he was given another unit of PRBCs. Hemoglobin now stable.  Medical Consultants:    Orthopedic Surgery   Discharge Exam:   Vitals:   06/20/16 1952 06/21/16 0751  BP: (!) 159/85 (!) 167/77  Pulse: 69 87  Resp: 17   Temp: 98.4 F (36.9 C) 99 F (37.2 C)   Vitals:   06/20/16 1057 06/20/16 1409 06/20/16 1952 06/21/16 0751  BP: (!) 124/27 133/63 Marland Kitchen)  159/85 (!) 167/77  Pulse:  72 69 87  Resp:  17 17   Temp:  98.3 F (36.8 C) 98.4 F (36.9 C) 99 F (37.2 C)  TempSrc:  Oral Oral Oral  SpO2:  99% 98% 96%   General exam: Appears calm and comfortable.  Respiratory system: Clear to auscultation.  Respiratory effort normal. Cardiovascular system: S1 & S2 heard, RRR. No JVD,  rubs, gallops or clicks. No murmurs. Gastrointestinal system: Abdomen is nondistended, soft and nontender. No organomegaly or masses felt. Normal bowel sounds heard. Central nervous system: Alert and oriented. No focal neurological deficits. Extremities: No clubbing,  or cyanosis. Left leg swollen compared to right. SCDs on. Skin: No rashes, lesions or ulcers. Psychiatry: Judgement and insight appear normal. Mood & affect appropriate.    The results of significant diagnostics from this hospitalization (including imaging, microbiology, ancillary and laboratory) are listed below for reference.     Procedures and Diagnostic Studies:   Dg Chest 1 View  Result Date: 06/15/2016 CLINICAL DATA:  Acute onset of left hip pain and deformity after fall. Initial encounter. EXAM: CHEST 1 VIEW COMPARISON:  Chest radiograph performed 05/15/2016 FINDINGS: The lungs are well-aerated and clear. There is no evidence of focal opacification, pleural effusion or pneumothorax. The cardiomediastinal silhouette is within normal limits. No acute osseous abnormalities are seen. IMPRESSION: No acute cardiopulmonary process seen. No displaced rib fractures identified. Electronically Signed   By: Garald Balding M.D.   On: 06/15/2016 06:20   Ct Head Wo Contrast  Result Date: 06/15/2016 CLINICAL DATA:  Status post fall, hitting head. Concern for head or cervical spine injury. Initial encounter. EXAM: CT HEAD WITHOUT CONTRAST CT CERVICAL SPINE WITHOUT CONTRAST TECHNIQUE: Multidetector CT imaging of the head and cervical spine was performed following the standard protocol without intravenous contrast. Multiplanar CT image reconstructions of the cervical spine were also generated. COMPARISON:  CT of the head performed 05/15/2016, and CT of the cervical spine performed 08/07/2015. MRI of the brain performed 07/07/2015 FINDINGS: CT HEAD FINDINGS There is no  evidence of acute infarction, mass lesion, or intra- or extra-axial hemorrhage on CT. Prominence of the ventricles and sulci reflects mild cortical volume loss. Mild cerebellar atrophy is noted. Scattered periventricular and subcortical white matter change likely reflects small vessel ischemic microangiopathy. The brainstem and fourth ventricle are within normal limits. The basal ganglia are unremarkable in appearance. The cerebral hemispheres demonstrate grossly normal gray-white differentiation. No mass effect or midline shift is seen. There is no evidence of fracture; visualized osseous structures are unremarkable in appearance. The visualized portions of the orbits are within normal limits. There is mild partial opacification of the left maxillary sinus. The remaining paranasal sinuses and mastoid air cells are well-aerated. No significant soft tissue abnormalities are seen. CT CERVICAL SPINE FINDINGS There is no evidence of fracture or subluxation. Vertebral bodies demonstrate normal height and alignment. Multilevel disc space narrowing and endplate sclerotic change are noted along the cervical spine, with anterior and posterior disc osteophyte complexes. Prevertebral soft tissues are within normal limits. The thyroid gland is unremarkable in appearance. The visualized lung apices are clear. Minimal calcification is seen at the carotid bifurcations bilaterally. IMPRESSION: 1. No evidence of traumatic intracranial injury or fracture. 2. No evidence of fracture or subluxation along the cervical spine. 3. Mild cortical volume loss and scattered small vessel ischemic microangiopathy. 4. Mild degenerative change along the cervical spine. 5. Mild partial opacification of the left maxillary sinus. Electronically Signed   By: Jacqulynn Cadet  Chang M.D.   On: 06/15/2016 05:55   Ct Cervical Spine Wo Contrast  Result Date: 06/15/2016 CLINICAL DATA:  Status post fall, hitting head. Concern for head or cervical spine injury.  Initial encounter. EXAM: CT HEAD WITHOUT CONTRAST CT CERVICAL SPINE WITHOUT CONTRAST TECHNIQUE: Multidetector CT imaging of the head and cervical spine was performed following the standard protocol without intravenous contrast. Multiplanar CT image reconstructions of the cervical spine were also generated. COMPARISON:  CT of the head performed 05/15/2016, and CT of the cervical spine performed 08/07/2015. MRI of the brain performed 07/07/2015 FINDINGS: CT HEAD FINDINGS There is no evidence of acute infarction, mass lesion, or intra- or extra-axial hemorrhage on CT. Prominence of the ventricles and sulci reflects mild cortical volume loss. Mild cerebellar atrophy is noted. Scattered periventricular and subcortical white matter change likely reflects small vessel ischemic microangiopathy. The brainstem and fourth ventricle are within normal limits. The basal ganglia are unremarkable in appearance. The cerebral hemispheres demonstrate grossly normal gray-white differentiation. No mass effect or midline shift is seen. There is no evidence of fracture; visualized osseous structures are unremarkable in appearance. The visualized portions of the orbits are within normal limits. There is mild partial opacification of the left maxillary sinus. The remaining paranasal sinuses and mastoid air cells are well-aerated. No significant soft tissue abnormalities are seen. CT CERVICAL SPINE FINDINGS There is no evidence of fracture or subluxation. Vertebral bodies demonstrate normal height and alignment. Multilevel disc space narrowing and endplate sclerotic change are noted along the cervical spine, with anterior and posterior disc osteophyte complexes. Prevertebral soft tissues are within normal limits. The thyroid gland is unremarkable in appearance. The visualized lung apices are clear. Minimal calcification is seen at the carotid bifurcations bilaterally. IMPRESSION: 1. No evidence of traumatic intracranial injury or fracture.  2. No evidence of fracture or subluxation along the cervical spine. 3. Mild cortical volume loss and scattered small vessel ischemic microangiopathy. 4. Mild degenerative change along the cervical spine. 5. Mild partial opacification of the left maxillary sinus. Electronically Signed   By: Garald Balding M.D.   On: 06/15/2016 05:55   Pelvis Portable  Result Date: 06/16/2016 CLINICAL DATA:  70 year old male with a history of left hip fracture repair EXAM: PORTABLE PELVIS 1-2 VIEWS COMPARISON:  06/15/2016 FINDINGS: Bony pelvic ring intact. Surgical changes of right hip arthroplasty, unchanged. Interval ORIF of left hip fracture with anatomic alignment reestablish. No complicating features. IMPRESSION: Early postoperative changes of left hip intratrochanteric fracture ORIF with anatomic alignment reestablished. No complicating features. Surgical changes of right hip arthroplasty. Signed, Dulcy Fanny. Earleen Newport, DO [facility] Electronically Signed   By: Corrie Mckusick D.O.   On: 06/16/2016 13:01   Dg Knee Left Port  Result Date: 06/15/2016 CLINICAL DATA:  Left knee pain today. No known injury. Initial encounter. EXAM: PORTABLE LEFT KNEE - 1-2 VIEW COMPARISON:  None. FINDINGS: No evidence of fracture, dislocation, or joint effusion. No evidence of arthropathy or other focal bone abnormality. Soft tissues are unremarkable. IMPRESSION: Negative exam. Electronically Signed   By: Inge Rise M.D.   On: 06/15/2016 16:42   Dg C-arm 1-60 Min  Result Date: 06/16/2016 CLINICAL DATA:  ORIF left hip EXAM: DG C-ARM 61-120 MIN; LEFT FEMUR 2 VIEWS FLUOROSCOPY TIME:  1 minutes 55 seconds COMPARISON:  Left hip radiographs dated 06/15/2016 FINDINGS: Intraoperative fluoroscopic radiographs during IM nail with dynamic hip screw fixation of an intertrochanteric left hip fracture. Fracture fragments are in near anatomic alignment  and position. Single distal interlocking screw.  IMPRESSION: Intraoperative fluoroscopic radiographs during ORIF of an intertrochanteric left hip fracture, as above. Electronically Signed   By: Julian Hy M.D.   On: 06/16/2016 12:17   Dg Hip Unilat W Or Wo Pelvis 2-3 Views Left  Result Date: 06/15/2016 CLINICAL DATA:  Status post unwitnessed fall, with left hip pain and deformity. Initial encounter. EXAM: DG HIP (WITH OR WITHOUT PELVIS) 2-3V LEFT COMPARISON:  None. FINDINGS: There is a comminuted left femoral intertrochanteric fracture, with mild displacement. The left femoral head remains seated at the acetabulum. The right hip arthroplasty is grossly unremarkable in appearance. No additional fractures are seen. The sacroiliac joints are within normal limits. Mild degenerative change is noted at the lower lumbar spine. Soft tissue swelling is noted about the left hip. IMPRESSION: Comminuted left femoral intertrochanteric fracture, with mild displacement. Electronically Signed   By: Garald Balding M.D.   On: 06/15/2016 06:21   Dg Femur Min 2 Views Left  Result Date: 06/16/2016 CLINICAL DATA:  ORIF left hip EXAM: DG C-ARM 61-120 MIN; LEFT FEMUR 2 VIEWS FLUOROSCOPY TIME:  1 minutes 55 seconds COMPARISON:  Left hip radiographs dated 06/15/2016 FINDINGS: Intraoperative fluoroscopic radiographs during IM nail with dynamic hip screw fixation of an intertrochanteric left hip fracture. Fracture fragments are in near anatomic alignment and position. Single distal interlocking screw. IMPRESSION: Intraoperative fluoroscopic radiographs during ORIF of an intertrochanteric left hip fracture, as above. Electronically Signed   By: Julian Hy M.D.   On: 06/16/2016 12:17   Dg Femur Port Min 2 Views Left  Result Date: 06/16/2016 CLINICAL DATA:  70 year old male with a history of postoperative stricture or pair left hip. EXAM: LEFT FEMUR PORTABLE 2 VIEWS COMPARISON:  06/15/2016 FINDINGS: Early postoperative changes of left inter trochanteric hip fracture  repair with antegrade left femoral intra medullary rod and gamma nail fixation at the fracture site alignment is now anatomic. No complicating features. Single distal interlocking screw. IMPRESSION: Early postoperative changes of left intratrochanteric hip fracture ORIF with antegrade intra medullary rod and gamma nail fixation of the femoral head, with single distal interlocking screw. No complicating features. Signed, Dulcy Fanny. Earleen Newport, DO [facility] Electronically Signed   By: Corrie Mckusick D.O.   On: 06/16/2016 12:59     Labs:   Basic Metabolic Panel:  Recent Labs Lab 06/15/16 0511 06/15/16 0521 06/17/16 0450 06/18/16 0530 06/19/16 0421 06/20/16 0454  NA 135 136 137 134* 139 140  K 4.6 5.0 3.8 3.8 3.6 3.8  CL 104 102 108 107 109 108  CO2 22  --  23 19* 24 24  GLUCOSE 492* 477* 38* 211* 45* 75  BUN 30* 34* _0 CREATININE 2.09* 2.00* 1.28* 1.35* 1.22 1.14  CALCIUM 8.6*  --  8.3* 7.9* 8.2* 8.1*   GFR CrCl cannot be calculated (Unknown ideal weight.). Liver Function Tests:  Recent Labs Lab 06/15/16 0511 06/17/16 0450 06/18/16 0530  AST _1 ALT 13* 11* 8*  ALKPHOS 70 56 54  BILITOT 1.0 0.3 1.0  PROT 6.9 5.8* 5.0*  ALBUMIN 3.8 2.8* 2.4*   Coagulation profile  Recent Labs Lab 06/15/16 0511  INR 0.97    CBC:  Recent Labs Lab 06/15/16 0534  06/18/16 0530 06/18/16 1110 06/19/16 0421 06/20/16 0454 06/21/16 0556  WBC 8.1  < > 7.0 6.6 6.7 6.3 7.9  NEUTROABS 6.0  --   --   --   --   --   --  HGB 8.9*  < > 7.9* 8.2* 7.3* 8.7* 9.5*  HCT 27.1*  < > 24.0* 24.9* 22.5* 26.8* 29.0*  MCV 89.1  < > 89.9 89.9 89.6 90.8 90.9  PLT 186  < > 137* 152 166 189 223  < > = values in this interval not displayed.  CBG:  Recent Labs Lab 06/20/16 2108 06/20/16 2302 06/21/16 0025 06/21/16 0254 06/21/16 0724  GLUCAP 209* 207* 235* 286* 352*   Microbiology Recent Results (from the past 240 hour(s))    Surgical pcr screen     Status: Abnormal   Collection Time: 06/15/16  7:14 PM  Result Value Ref Range Status   MRSA, PCR NEGATIVE NEGATIVE Final   Staphylococcus aureus POSITIVE (A) NEGATIVE Final    Comment:        The Xpert SA Assay (FDA approved for NASAL specimens in patients over 78 years of age), is one component of a comprehensive surveillance program.  Test performance has been validated by [facility] for patients greater than or equal to 54 year old. It is not intended to diagnose infection nor to guide or monitor treatment.      Discharge Instructions:   Discharge Instructions    Call MD for:  extreme fatigue    Complete by:  As directed   Call MD for:  persistant dizziness or light-headedness    Complete by:  As directed   Call MD for:  severe uncontrolled pain    Complete by:  As directed   Diet - low sodium heart healthy    Complete by:  As directed   Diet Carb Modified    Complete by:  As directed   Increase activity slowly    Complete by:  As directed       Medication List    STOP taking these medications   alprazolam 2 MG tablet Commonly known as:  XANAX   aspirin EC 325 MG tablet   gabapentin 300 MG capsule Commonly known as:  NEURONTIN   insulin aspart protamine - aspart (70-30) 100 UNIT/ML FlexPen Commonly known as:  NOVOLOG 70/30 MIX   Insulin Pen Needle 31G X 5 MM Misc     TAKE these medications   acetaminophen 325 MG tablet Commonly known as:  TYLENOL Take 2 tablets (650 mg total) by mouth every 6 (six) hours as needed for mild pain (or Fever >/= 101).   amLODipine 10 MG tablet Commonly known as:  NORVASC Take 1 tablet (10 mg total) by mouth daily.   atorvastatin 10 MG tablet Commonly known as:  LIPITOR Take 1 tablet (10 mg total) by mouth daily at 6 PM.   blood glucose meter kit and supplies Use daily to check blood sugar. DX: E11.09   carvedilol 6.25 MG tablet Commonly known as:  COREG Take 1 tablet (6.25 mg total) by  mouth 2 (two) times daily with a meal.   cyanocobalamin 1000 MCG/ML injection Commonly known as:  (VITAMIN B-12) Inject 1 mL (1,000 mcg total) into the muscle every 30 (thirty) days.   feeding supplement (GLUCERNA SHAKE) Liqd Take 237 mLs by mouth 2 (two) times daily between meals.   glucose blood test strip Commonly known as:  COOL BLOOD GLUCOSE TEST STRIPS Use as instructed to check blood sugar up to 3 times a day. DX: E11.09   insulin aspart 100 UNIT/ML injection Commonly known as:  novoLOG Inject 0-9 Units into the skin 3 (three) times daily with meals.   insulin aspart 100 UNIT/ML injection  Commonly known as:  novoLOG Inject 3 Units into the skin 3 (three) times daily with meals.   insulin glargine 100 UNIT/ML injection Commonly known as:  LANTUS Inject 0.08 mLs (8 Units total) into the skin daily.   Lancets Misc Use as instructed to check blood sugar up to 3 times a day. DX: E11.09   oxyCODONE 5 MG immediate release tablet Commonly known as:  Oxy IR/ROXICODONE Take 1 tablet (5 mg total) by mouth every 4 (four) hours as needed for moderate pain.   polyethylene glycol packet Commonly known as:  MIRALAX / GLYCOLAX Take 17 g by mouth daily as needed for mild constipation.   ramipril 10 MG capsule Commonly known as:  ALTACE Take 10 mg by mouth daily.   senna 8.6 MG Tabs tablet Commonly known as:  SENOKOT Take 1 tablet (8.6 mg total) by mouth 2 (two) times daily.   thiamine 100 MG tablet Take 1 tablet (100 mg total) by mouth daily.       Contact information for follow-up providers    [place], Horald Pollen, MD. Schedule an appointment as soon as possible for a visit in 2 week(s).   Specialty:  Orthopedic Surgery Why:  For wound re-check Contact information: [address] [place] [address] [phone]            Contact information for after-discharge care    [facility] .   Specialty:  [place] information: [address] [place] [phone]                   Time coordinating discharge: > 35 minutes.  Signed:  RAMA,CHRISTINA  Pager [phone] [facility] 06/21/2016, 9:36 AM

## 2016-06-21 NOTE — Progress Notes (Signed)
Full report given to RN from [employer]. Patient will be transferred via [place].

## 2016-06-21 NOTE — Progress Notes (Signed)
[name] [name] to be D/C'd Skilled nursing facility per MD order.  Discussed with the patient and all questions fully answered.  VSS, Skin clean, dry and intact without evidence of skin break down, no evidence of skin tears noted. IV catheter discontinued intact. Site without signs and symptoms of complications. Dressing and pressure applied.  An After Visit Summary was printed and given to the patient. Patient received prescription.  D/c education completed with patient/family including follow up instructions, medication list, d/c activities limitations if indicated, with other d/c instructions as indicated by MD - patient able to verbalize understanding, all questions fully answered.   Patient instructed to return to ED, call 911, or call MD for any changes in condition.   Patient escorted PTAR  [name] 06/21/2016 4:02 PM

## 2016-06-23 ENCOUNTER — Encounter (HOSPITAL_COMMUNITY): Payer: Self-pay | Admitting: Orthopedic Surgery

## 2016-06-23 ENCOUNTER — Telehealth: Payer: Self-pay | Admitting: *Deleted

## 2016-06-23 NOTE — Telephone Encounter (Signed)
Pt was on TCM list admitted for Closed comminuted intertrochanteric fracture of left femur. Pt was D/C 9/9, and sent to SNF...Johny Chess

## 2016-07-05 ENCOUNTER — Inpatient Hospital Stay (HOSPITAL_COMMUNITY)
Admission: EM | Admit: 2016-07-05 | Discharge: 2016-07-07 | DRG: 312 | Disposition: A | Discharge status: Skilled Nursing Facility | Payer: Medicare HMO | Attending: Internal Medicine | Admitting: Internal Medicine

## 2016-07-05 ENCOUNTER — Emergency Department (HOSPITAL_COMMUNITY): Payer: Medicare HMO

## 2016-07-05 ENCOUNTER — Encounter (HOSPITAL_COMMUNITY): Payer: Self-pay

## 2016-07-05 DIAGNOSIS — Z794 Long term (current) use of insulin: Secondary | ICD-10-CM

## 2016-07-05 DIAGNOSIS — Z79899 Other long term (current) drug therapy: Secondary | ICD-10-CM

## 2016-07-05 DIAGNOSIS — E1165 Type 2 diabetes mellitus with hyperglycemia: Secondary | ICD-10-CM | POA: Diagnosis present

## 2016-07-05 DIAGNOSIS — Z86718 Personal history of other venous thrombosis and embolism: Secondary | ICD-10-CM | POA: Diagnosis present

## 2016-07-05 DIAGNOSIS — D6489 Other specified anemias: Secondary | ICD-10-CM | POA: Diagnosis present

## 2016-07-05 DIAGNOSIS — E538 Deficiency of other specified B group vitamins: Secondary | ICD-10-CM | POA: Diagnosis present

## 2016-07-05 DIAGNOSIS — I129 Hypertensive chronic kidney disease with stage 1 through stage 4 chronic kidney disease, or unspecified chronic kidney disease: Secondary | ICD-10-CM | POA: Diagnosis present

## 2016-07-05 DIAGNOSIS — Z8673 Personal history of transient ischemic attack (TIA), and cerebral infarction without residual deficits: Secondary | ICD-10-CM | POA: Diagnosis not present

## 2016-07-05 DIAGNOSIS — R55 Syncope and collapse: Principal | ICD-10-CM | POA: Diagnosis present

## 2016-07-05 DIAGNOSIS — R0600 Dyspnea, unspecified: Secondary | ICD-10-CM

## 2016-07-05 DIAGNOSIS — D62 Acute posthemorrhagic anemia: Secondary | ICD-10-CM | POA: Diagnosis not present

## 2016-07-05 DIAGNOSIS — R778 Other specified abnormalities of plasma proteins: Secondary | ICD-10-CM | POA: Diagnosis present

## 2016-07-05 DIAGNOSIS — R609 Edema, unspecified: Secondary | ICD-10-CM | POA: Diagnosis not present

## 2016-07-05 DIAGNOSIS — N179 Acute kidney failure, unspecified: Secondary | ICD-10-CM | POA: Diagnosis present

## 2016-07-05 DIAGNOSIS — I444 Left anterior fascicular block: Secondary | ICD-10-CM | POA: Diagnosis present

## 2016-07-05 DIAGNOSIS — Z87891 Personal history of nicotine dependence: Secondary | ICD-10-CM

## 2016-07-05 DIAGNOSIS — N183 Chronic kidney disease, stage 3 unspecified: Secondary | ICD-10-CM | POA: Diagnosis present

## 2016-07-05 DIAGNOSIS — I82442 Acute embolism and thrombosis of left tibial vein: Secondary | ICD-10-CM | POA: Diagnosis present

## 2016-07-05 DIAGNOSIS — M4854XA Collapsed vertebra, not elsewhere classified, thoracic region, initial encounter for fracture: Secondary | ICD-10-CM | POA: Diagnosis present

## 2016-07-05 DIAGNOSIS — N189 Chronic kidney disease, unspecified: Secondary | ICD-10-CM

## 2016-07-05 DIAGNOSIS — Z96641 Presence of right artificial hip joint: Secondary | ICD-10-CM | POA: Diagnosis present

## 2016-07-05 DIAGNOSIS — I10 Essential (primary) hypertension: Secondary | ICD-10-CM | POA: Diagnosis not present

## 2016-07-05 DIAGNOSIS — K661 Hemoperitoneum: Secondary | ICD-10-CM

## 2016-07-05 DIAGNOSIS — E1122 Type 2 diabetes mellitus with diabetic chronic kidney disease: Secondary | ICD-10-CM | POA: Diagnosis present

## 2016-07-05 DIAGNOSIS — K683 Retroperitoneal hematoma: Secondary | ICD-10-CM

## 2016-07-05 DIAGNOSIS — Z7982 Long term (current) use of aspirin: Secondary | ICD-10-CM

## 2016-07-05 DIAGNOSIS — M6281 Muscle weakness (generalized): Secondary | ICD-10-CM

## 2016-07-05 DIAGNOSIS — E1121 Type 2 diabetes mellitus with diabetic nephropathy: Secondary | ICD-10-CM | POA: Diagnosis present

## 2016-07-05 DIAGNOSIS — R0602 Shortness of breath: Secondary | ICD-10-CM

## 2016-07-05 DIAGNOSIS — IMO0002 Reserved for concepts with insufficient information to code with codable children: Secondary | ICD-10-CM

## 2016-07-05 LAB — COMPREHENSIVE METABOLIC PANEL
ALT: 11 U/L — ABNORMAL LOW (ref 17–63)
AST: 20 U/L (ref 15–41)
Albumin: 3 g/dL — ABNORMAL LOW (ref 3.5–5.0)
Alkaline Phosphatase: 157 U/L — ABNORMAL HIGH (ref 38–126)
Anion gap: 6 (ref 5–15)
BUN: 23 mg/dL — ABNORMAL HIGH (ref 6–20)
CO2: 27 mmol/L (ref 22–32)
Calcium: 9.1 mg/dL (ref 8.9–10.3)
Chloride: 104 mmol/L (ref 101–111)
Creatinine, Ser: 1.52 mg/dL — ABNORMAL HIGH (ref 0.61–1.24)
GFR calc Af Amer: 51 mL/min — ABNORMAL LOW (ref >60)
GFR calc non Af Amer: 44 mL/min — ABNORMAL LOW (ref >60)
Glucose, Bld: 174 mg/dL — ABNORMAL HIGH (ref 65–99)
Potassium: 4.2 mmol/L (ref 3.5–5.1)
Sodium: 137 mmol/L (ref 135–145)
Total Bilirubin: 0.6 mg/dL (ref 0.3–1.2)
Total Protein: 7.1 g/dL (ref 6.5–8.1)

## 2016-07-05 LAB — CBC WITH DIFFERENTIAL/PLATELET
Basophils Absolute: 0 10*3/uL (ref 0.0–0.1)
Basophils Relative: 1 %
Eosinophils Absolute: 0.3 10*3/uL (ref 0.0–0.7)
Eosinophils Relative: 5 %
HCT: 32.2 % — ABNORMAL LOW (ref 39.0–52.0)
Hemoglobin: 10.1 g/dL — ABNORMAL LOW (ref 13.0–17.0)
Lymphocytes Relative: 24 %
Lymphs Abs: 1.7 10*3/uL (ref 0.7–4.0)
MCH: 29.4 pg (ref 26.0–34.0)
MCHC: 31.4 g/dL (ref 30.0–36.0)
MCV: 93.9 fL (ref 78.0–100.0)
Monocytes Absolute: 0.6 10*3/uL (ref 0.1–1.0)
Monocytes Relative: 9 %
Neutro Abs: 4.5 10*3/uL (ref 1.7–7.7)
Neutrophils Relative %: 61 %
Platelets: 394 10*3/uL (ref 150–400)
RBC: 3.43 MIL/uL — ABNORMAL LOW (ref 4.22–5.81)
RDW: 13.8 % (ref 11.5–15.5)
WBC: 7.2 10*3/uL (ref 4.0–10.5)

## 2016-07-05 LAB — TROPONIN I: Troponin I: 0.03 ng/mL (ref <0.03)

## 2016-07-05 MED ORDER — SODIUM CHLORIDE 0.9 % IV SOLN
INTRAVENOUS | Status: DC
  Administered 2016-07-06 (×2): via INTRAVENOUS

## 2016-07-05 MED ORDER — SODIUM CHLORIDE 0.9 % IV BOLUS (SEPSIS)
500.0000 mL | Freq: Once | INTRAVENOUS | Status: AC
  Administered 2016-07-06: 500 mL via INTRAVENOUS

## 2016-07-05 NOTE — ED Triage Notes (Signed)
Pt from [place] found outside in wheelchair unresponsive.  No injuries, no fall.  Recent left leg fracture and surgery.  Fire gave 2mg  Narcan intranasal.  CBG 232

## 2016-07-05 NOTE — ED Provider Notes (Signed)
[facility] DEPT Provider Note   CSN: [id number] Arrival date & time: 07/05/16  2032     History   Chief Complaint Chief Complaint  Patient presents with  . Loss of Consciousness    HPI [name] [name] is a 70 y.o. male.  70 year old male presents from nursing home after having a syncopal event. Patient was found unresponsive in his wheelchair. He is currently therefore we have prior left femur fracture. EMS was called and patient given Narcan and patient became responsive. Patient's blood glucose was above 200. He states he feels back to his baseline. His family is at the bedside and they agree with this. He denies any shortness of breath or chest discomfort. No abdominal discomfort. Denies any prior history of syncope      Past Medical History:  Diagnosis Date  . 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 Closed comminuted intertrochanteric fracture of left femur ([place])   . Diabetes mellitus without complication ([place])   . Diabetic hyperosmolar non-ketotic state ([place]) 05/15/2016  . DKA (diabetic ketoacidoses) ([place]) 05/09/2016  . Hypertension   . Postoperative anemia due to acute blood loss 06/18/2016  . Retroperitoneal hematoma 06/18/2016  . Vitamin B 12 deficiency 06/18/2016    Patient Active Problem List   Diagnosis Date Noted  . Postoperative anemia due to acute blood loss 06/18/2016  . Retroperitoneal hematoma 06/18/2016  . Vitamin B 12 deficiency 06/18/2016  . Elective surgery   . Postop check   . Closed comminuted intertrochanteric fracture of left femur ([place])   . Renal insufficiency   . Hyponatremia 05/15/2016  . Anxiety 05/15/2016  . History of CVA (cerebrovascular accident) 05/15/2016  . Acute on chronic renal failure ([place]) 05/09/2016  . Hypoglycemia 11/02/2015  . Chronic prescription benzodiazepine use 11/02/2015  . Narcotic dependency, continuous ([place]) 11/02/2015  . Uncontrolled type 2 diabetes mellitus with  diabetic nephropathy, with long-term current use of insulin ([place]) 08/12/2015  . Benign essential HTN 08/12/2015  . Dyslipidemia associated with type 2 diabetes mellitus ([place]) 08/12/2015  . Depression 08/12/2015  . CKD (chronic kidney disease) stage 3, GFR 30-59 ml/min 03/20/2014    Past Surgical History:  Procedure Laterality Date  . HIP ARTHROPLASTY Right 02/05/2013   Procedure: ARTHROPLASTY BIPOLAR HIP;  Surgeon: [name], MD;  Location: [facility];  Service: Orthopedics;  Laterality: Right;  . INTRAMEDULLARY (IM) NAIL INTERTROCHANTERIC Left 06/16/2016   Procedure: INTRAMEDULLARY (IM) NAIL INTERTROCHANTRIC;  Surgeon: Rod Can, MD;  Location: [facility];  Service: Orthopedics;  Laterality: Left;       Home Medications    Prior to Admission medications   Medication Sig Start Date End Date Taking? Authorizing Provider  amLODipine (NORVASC) 10 MG tablet Take 1 tablet (10 mg total) by mouth daily. 08/14/15  Yes Robbie Lis, MD  aspirin EC 81 MG tablet Take 81 mg by mouth every morning.   Yes Historical Provider, MD  atorvastatin (LIPITOR) 10 MG tablet Take 1 tablet (10 mg total) by mouth daily at 6 PM. 03/23/15  Yes [name], DO  clopidogrel (PLAVIX) 75 MG tablet Take 75 mg by mouth daily.   Yes Historical Provider, MD  cyanocobalamin (,VITAMIN B-12,) 1000 MCG/ML injection Inject 1 mL (1,000 mcg total) into the muscle every 30 (thirty) days. Patient taking differently: Inject 1,000 mcg into the muscle every Monday.  06/20/16  Yes Christina P Rama, MD  insulin glargine (LANTUS) 100 UNIT/ML injection Inject 0.08 mLs (8 Units total) into  the skin daily. Patient taking differently: Inject 20 Units into the skin daily with supper.  06/20/16  Yes Christina P Rama, MD  lisinopril (PRINIVIL,ZESTRIL) 20 MG tablet Take 20 mg by mouth daily.   Yes Historical Provider, MD  acetaminophen (TYLENOL) 325 MG tablet Take 2 tablets (650 mg total) by mouth every 6 (six) hours as needed for mild pain (or  Fever >/= 101). Patient not taking: Reported on 06/16/2016 08/14/15   Robbie Lis, MD  blood glucose meter kit and supplies Use daily to check blood sugar. DX: E11.09 Patient not taking: Reported on 06/17/2016 04/24/16   Golden Circle, [name]  carvedilol (COREG) 6.25 MG tablet Take 1 tablet (6.25 mg total) by mouth 2 (two) times daily with a meal. 05/12/16   Thurnell Lose, MD  feeding supplement, GLUCERNA SHAKE, (GLUCERNA SHAKE) LIQD Take 237 mLs by mouth 2 (two) times daily between meals. Patient not taking: Reported on 06/16/2016 08/14/15   Robbie Lis, MD  glucose blood (COOL BLOOD GLUCOSE TEST STRIPS) test strip Use as instructed to check blood sugar up to 3 times a day. DX: E11.09 Patient not taking: Reported on 06/17/2016 04/24/16   Golden Circle, [name]  insulin aspart (NOVOLOG) 100 UNIT/ML injection Inject 0-9 Units into the skin 3 (three) times daily with meals. 06/21/16   Venetia Maxon Rama, MD  insulin aspart (NOVOLOG) 100 UNIT/ML injection Inject 3 Units into the skin 3 (three) times daily with meals. 06/21/16   Venetia Maxon Rama, MD  Lancets MISC Use as instructed to check blood sugar up to 3 times a day. DX: E11.09 Patient not taking: Reported on 06/17/2016 10/16/15   Golden Circle, [name]  oxyCODONE (OXY IR/ROXICODONE) 5 MG immediate release tablet Take 1 tablet (5 mg total) by mouth every 4 (four) hours as needed for moderate pain. 06/20/16   Venetia Maxon Rama, MD  polyethylene glycol (MIRALAX / GLYCOLAX) packet Take 17 g by mouth daily as needed for mild constipation. 06/20/16   Christina P Rama, MD  polyethylene glycol powder (GLYCOLAX/MIRALAX) powder Take 17 g by mouth daily. 06/20/16   Historical Provider, MD  ramipril (ALTACE) 10 MG capsule Take 10 mg by mouth daily.  05/03/16   Historical Provider, MD  senna (SENOKOT) 8.6 MG TABS tablet Take 1 tablet (8.6 mg total) by mouth 2 (two) times daily. 06/20/16   Venetia Maxon Rama, MD  thiamine 100 MG tablet Take 1 tablet (100 mg total) by mouth daily. Patient  not taking: Reported on 06/17/2016 11/03/15   Nat Math, MD    Family History Family History  Problem Relation Age of Onset  . Diabetes Mother   . Alzheimer's disease Mother   . Hypertension Mother   . Hyperlipidemia Father   . Hypertension Father   . Healthy Maternal Grandmother   . Pneumonia Maternal Grandfather     Social History Social History  Substance Use Topics  . Smoking status: Former Smoker    Packs/day: 0.25    Years: 30.00    Types: Cigarettes    Quit date: 01/31/2013  . Smokeless tobacco: Never Used  . Alcohol use No     Allergies   Review of patient's allergies indicates no known allergies.   Review of Systems Review of Systems  All other systems reviewed and are negative.    Physical Exam Updated Vital Signs BP 99/62   Pulse 71   Resp 10   Ht _0  (1.753 m)   Wt 68.5 kg  SpO2 99%   BMI 22.30 kg/m   Physical Exam  Constitutional: He is oriented to person, place, and time. He appears well-developed and well-nourished.  Non-toxic appearance. No distress.  HENT:  Head: Normocephalic and atraumatic.  Eyes: Conjunctivae, EOM and lids are normal. Pupils are equal, round, and reactive to light.  Neck: Normal range of motion. Neck supple. No tracheal deviation present. No thyroid mass present.  Cardiovascular: Normal rate, regular rhythm and normal heart sounds.  Exam reveals no gallop.   No murmur heard. Pulmonary/Chest: Effort normal and breath sounds normal. No stridor. No respiratory distress. He has no decreased breath sounds. He has no wheezes. He has no rhonchi. He has no rales.  Abdominal: Soft. Normal appearance and bowel sounds are normal. He exhibits no distension. There is no tenderness. There is no rebound and no CVA tenderness.  Musculoskeletal: Normal range of motion. He exhibits no edema or tenderness.  Neurological: He is alert and oriented to person, place, and time. He has normal strength. No cranial nerve deficit or sensory  deficit. GCS eye subscore is 4. GCS verbal subscore is 5. GCS motor subscore is 6.  Skin: Skin is warm and dry. No abrasion and no rash noted.  Psychiatric: He has a normal mood and affect. His speech is normal and behavior is normal.  Nursing note and vitals reviewed.    ED Treatments / Results  Labs (all labs ordered are listed, but only abnormal results are displayed) Labs Reviewed  CBC WITH DIFFERENTIAL/PLATELET  COMPREHENSIVE METABOLIC PANEL  TROPONIN I    EKG  EKG Interpretation  Date/Time:  Saturday July 05 2016 20:38:25 EDT Ventricular Rate:  67 PR Interval:    QRS Duration: 91 QT Interval:  377 QTC Calculation: 398 R Axis:   -45 Text Interpretation:  Sinus rhythm Left anterior fascicular block Borderline ST elevation, anterior leads No significant change since last tracing Confirmed by Sayra Frisby  MD, Boyce Keltner ([id number]) on 07/05/2016 8:52:34 PM       Radiology No results found.  Procedures Procedures (including critical care time)  Medications Ordered in ED Medications - No data to display   Initial Impression / Assessment and Plan / ED Course  I have reviewed the triage vital signs and the nursing notes.  Pertinent labs & imaging results that were available during my care of the patient were reviewed by me and considered in my medical decision making (see chart for details).  Clinical Course    Patient given IV hydration here. Has evidence of some decreased renal function. Mildly elevated troponin at 0.03. Patient's EKG unchanged from prior studies. Will be admitted for further workup Final Clinical Impressions(s) / ED Diagnoses   Final diagnoses:  None    New Prescriptions New Prescriptions   No medications on file     Lacretia Leigh, MD 07/05/16 2342

## 2016-07-06 ENCOUNTER — Inpatient Hospital Stay (HOSPITAL_COMMUNITY): Payer: Medicare HMO

## 2016-07-06 DIAGNOSIS — N189 Chronic kidney disease, unspecified: Secondary | ICD-10-CM

## 2016-07-06 DIAGNOSIS — I10 Essential (primary) hypertension: Secondary | ICD-10-CM

## 2016-07-06 DIAGNOSIS — K661 Hemoperitoneum: Secondary | ICD-10-CM

## 2016-07-06 DIAGNOSIS — Z86718 Personal history of other venous thrombosis and embolism: Secondary | ICD-10-CM | POA: Diagnosis present

## 2016-07-06 DIAGNOSIS — E1121 Type 2 diabetes mellitus with diabetic nephropathy: Secondary | ICD-10-CM

## 2016-07-06 DIAGNOSIS — Z794 Long term (current) use of insulin: Secondary | ICD-10-CM

## 2016-07-06 DIAGNOSIS — D62 Acute posthemorrhagic anemia: Secondary | ICD-10-CM

## 2016-07-06 DIAGNOSIS — N179 Acute kidney failure, unspecified: Secondary | ICD-10-CM

## 2016-07-06 DIAGNOSIS — R609 Edema, unspecified: Secondary | ICD-10-CM

## 2016-07-06 DIAGNOSIS — R55 Syncope and collapse: Principal | ICD-10-CM

## 2016-07-06 DIAGNOSIS — E1165 Type 2 diabetes mellitus with hyperglycemia: Secondary | ICD-10-CM

## 2016-07-06 LAB — CBC
HCT: 29.1 % — ABNORMAL LOW (ref 39.0–52.0)
Hemoglobin: 9 g/dL — ABNORMAL LOW (ref 13.0–17.0)
MCH: 29.6 pg (ref 26.0–34.0)
MCHC: 30.9 g/dL (ref 30.0–36.0)
MCV: 95.7 fL (ref 78.0–100.0)
Platelets: 355 10*3/uL (ref 150–400)
RBC: 3.04 MIL/uL — ABNORMAL LOW (ref 4.22–5.81)
RDW: 14 % (ref 11.5–15.5)
WBC: 7.4 10*3/uL (ref 4.0–10.5)

## 2016-07-06 LAB — BASIC METABOLIC PANEL
Anion gap: 6 (ref 5–15)
BUN: 27 mg/dL — ABNORMAL HIGH (ref 6–20)
CO2: 24 mmol/L (ref 22–32)
Calcium: 8.4 mg/dL — ABNORMAL LOW (ref 8.9–10.3)
Chloride: 107 mmol/L (ref 101–111)
Creatinine, Ser: 1.55 mg/dL — ABNORMAL HIGH (ref 0.61–1.24)
GFR calc Af Amer: 50 mL/min — ABNORMAL LOW (ref >60)
GFR calc non Af Amer: 43 mL/min — ABNORMAL LOW (ref >60)
Glucose, Bld: 224 mg/dL — ABNORMAL HIGH (ref 65–99)
Potassium: 4.6 mmol/L (ref 3.5–5.1)
Sodium: 137 mmol/L (ref 135–145)

## 2016-07-06 LAB — MRSA PCR SCREENING: MRSA by PCR: INVALID — AB

## 2016-07-06 LAB — URINALYSIS, ROUTINE W REFLEX MICROSCOPIC
Bilirubin Urine: NEGATIVE
Glucose, UA: 500 mg/dL — AB
Hgb urine dipstick: NEGATIVE
Ketones, ur: NEGATIVE mg/dL
Leukocytes, UA: NEGATIVE
Nitrite: NEGATIVE
Protein, ur: NEGATIVE mg/dL
Specific Gravity, Urine: 1.01 (ref 1.005–1.030)
pH: 6.5 (ref 5.0–8.0)

## 2016-07-06 LAB — GLUCOSE, CAPILLARY
Glucose-Capillary: 96 mg/dL (ref 65–99)
Glucose-Capillary: 203 mg/dL — ABNORMAL HIGH (ref 65–99)
Glucose-Capillary: 150 mg/dL — ABNORMAL HIGH (ref 65–99)
Glucose-Capillary: 232 mg/dL — ABNORMAL HIGH (ref 65–99)
Glucose-Capillary: 204 mg/dL — ABNORMAL HIGH (ref 65–99)

## 2016-07-06 LAB — HEPARIN LEVEL (UNFRACTIONATED): Heparin Unfractionated: 0.39 IU/mL (ref 0.30–0.70)

## 2016-07-06 LAB — TROPONIN I
Troponin I: 0.05 ng/mL (ref <0.03)
Troponin I: 0.03 ng/mL (ref <0.03)
Troponin I: <0.03 ng/mL (ref <0.03)

## 2016-07-06 MED ORDER — SENNA 8.6 MG PO TABS
1.0000 | ORAL_TABLET | Freq: Two times a day (BID) | ORAL | Status: DC
  Administered 2016-07-06 – 2016-07-07 (×4): 8.6 mg via ORAL
  Filled 2016-07-06 (×4): qty 1

## 2016-07-06 MED ORDER — TECHNETIUM TC 99M DIETHYLENETRIAME-PENTAACETIC ACID: 32.0000 | Freq: Once | INTRAVENOUS | Status: DC | PRN

## 2016-07-06 MED ORDER — TECHNETIUM TO 99M ALBUMIN AGGREGATED
4.3900 | Freq: Once | INTRAVENOUS | Status: AC | PRN
Start: 2016-07-06 — End: 2016-07-06
  Administered 2016-07-06: 4 via INTRAVENOUS

## 2016-07-06 MED ORDER — AMLODIPINE BESYLATE 5 MG PO TABS
5.0000 mg | ORAL_TABLET | Freq: Every day | ORAL | Status: DC
  Administered 2016-07-06 – 2016-07-07 (×2): 5 mg via ORAL
  Filled 2016-07-06 (×2): qty 1

## 2016-07-06 MED ORDER — SODIUM CHLORIDE 0.9% FLUSH
3.0000 mL | Freq: Two times a day (BID) | INTRAVENOUS | Status: DC
  Administered 2016-07-06 (×3): 3 mL via INTRAVENOUS

## 2016-07-06 MED ORDER — OXYCODONE HCL 5 MG PO TABS
5.0000 mg | ORAL_TABLET | ORAL | Status: DC | PRN
  Administered 2016-07-06 – 2016-07-07 (×4): 5 mg via ORAL
  Filled 2016-07-06 (×7): qty 1

## 2016-07-06 MED ORDER — CLOPIDOGREL BISULFATE 75 MG PO TABS
75.0000 mg | ORAL_TABLET | Freq: Every day | ORAL | Status: DC
  Administered 2016-07-06: 75 mg via ORAL
  Filled 2016-07-06 (×2): qty 1

## 2016-07-06 MED ORDER — INSULIN GLARGINE 100 UNIT/ML ~~LOC~~ SOLN
10.0000 [IU] | Freq: Every day | SUBCUTANEOUS | Status: DC
  Administered 2016-07-06 – 2016-07-07 (×2): 10 [IU] via SUBCUTANEOUS
  Filled 2016-07-06 (×6): qty 0.1

## 2016-07-06 MED ORDER — HEPARIN (PORCINE) IN NACL 100-0.45 UNIT/ML-% IJ SOLN
1050.0000 [IU]/h | INTRAMUSCULAR | Status: DC
  Administered 2016-07-06: 1150 [IU]/h via INTRAVENOUS
  Filled 2016-07-06: qty 250

## 2016-07-06 MED ORDER — ACETAMINOPHEN 650 MG RE SUPP: 650.0000 mg | Freq: Four times a day (QID) | RECTAL | Status: DC | PRN

## 2016-07-06 MED ORDER — ONDANSETRON HCL 4 MG PO TABS: 4.0000 mg | ORAL_TABLET | Freq: Four times a day (QID) | ORAL | Status: DC | PRN

## 2016-07-06 MED ORDER — ASPIRIN EC 81 MG PO TBEC
81.0000 mg | DELAYED_RELEASE_TABLET | Freq: Every day | ORAL | Status: DC
  Administered 2016-07-06: 81 mg via ORAL
  Filled 2016-07-06 (×2): qty 1

## 2016-07-06 MED ORDER — INSULIN ASPART 100 UNIT/ML ~~LOC~~ SOLN
0.0000 [IU] | Freq: Three times a day (TID) | SUBCUTANEOUS | Status: DC
  Administered 2016-07-06 (×2): 3 [IU] via SUBCUTANEOUS
  Administered 2016-07-06: 1 [IU] via SUBCUTANEOUS
  Administered 2016-07-07: 2 [IU] via SUBCUTANEOUS

## 2016-07-06 MED ORDER — ATORVASTATIN CALCIUM 10 MG PO TABS
10.0000 mg | ORAL_TABLET | Freq: Every day | ORAL | Status: DC
  Administered 2016-07-06 – 2016-07-07 (×2): 10 mg via ORAL
  Filled 2016-07-06 (×2): qty 1

## 2016-07-06 MED ORDER — ACETAMINOPHEN 325 MG PO TABS
650.0000 mg | ORAL_TABLET | Freq: Four times a day (QID) | ORAL | Status: DC | PRN
  Administered 2016-07-06 – 2016-07-07 (×6): 650 mg via ORAL
  Filled 2016-07-06 (×8): qty 2

## 2016-07-06 MED ORDER — ONDANSETRON HCL 4 MG/2ML IJ SOLN
4.0000 mg | Freq: Four times a day (QID) | INTRAMUSCULAR | Status: DC | PRN
Start: 2016-07-06 — End: 2016-07-07

## 2016-07-06 MED ORDER — VITAMIN B-1 100 MG PO TABS
100.0000 mg | ORAL_TABLET | Freq: Every day | ORAL | Status: DC
  Administered 2016-07-06 – 2016-07-07 (×2): 100 mg via ORAL
  Filled 2016-07-06 (×2): qty 1

## 2016-07-06 NOTE — Progress Notes (Signed)
ANTICOAGULATION CONSULT NOTE Pharmacy Consult for Heparin Indication: DVT  No Known Allergies  Patient Measurements: Height: 5\' 9"  (175.3 cm) Weight: 138 lb 4.8 oz (62.7 kg) IBW/kg (Calculated) : 70.7  Vital Signs: Temp: 98.7 F (37.1 C) (09/24 2116) Temp Source: Oral (09/24 2116) BP: 160/76 (09/24 2116) Pulse Rate: 64 (09/24 2116)  Labs:  Recent Labs  07/05/16 2120 07/06/16 0148 07/06/16 0706 07/06/16 1357 07/06/16 2053  HGB 10.1*  --  9.0*  --   --   HCT 32.2*  --  29.1*  --   --   PLT 394  --  355  --   --   HEPARINUNFRC  --   --   --   --  0.39  CREATININE 1.52*  --  1.55*  --   --   TROPONINI 0.03* 0.05* 0.03* <0.03  --     Estimated Creatinine Clearance: 38.8 mL/min (by C-G formula based on SCr of 1.55 mg/dL (H)).   Assessment: [name] [name] is a 29 yoM presenting the ED after a witnessed sycopal episode while sitting in his wheelchair. Vascular ultrasound shows short segment DVT in left posterior tibial veins. Also on plavix and aspiring at home. Hgb 10.1>9.0, PLTC wnl, troponins elevated. No s/sx of bleeding notedD/t plavix/asa, recent retroperitoneal bleed and syncopal episode, will not bolus; instead target the higher end of the rate at 18 units/kg/hr.  First heparin level is therapeutic at 0.39 after heparin drip started at 1150 units/hr with no bolus.  No bleeding reported.   Goal of Therapy:  Heparin level 0.3-0.7 units/ml Monitor platelets by anticoagulation protocol: Yes   Plan:  continue heparin at 1150 units/hr Confirmatory HL tomorrow at 0500 am Daily heparin level and CBC Monitor signs/symptoms of bleeding  Eudelia Bunch, Pharm.D. [phone] 07/06/2016 9:43 PM

## 2016-07-06 NOTE — Progress Notes (Signed)
ANTICOAGULATION CONSULT NOTE - Initial Consult  Pharmacy Consult for Heparin Indication: DVT  No Known Allergies  Patient Measurements: Height: 5\' 9"  (175.3 cm) Weight: 138 lb 4.8 oz (62.7 kg) IBW/kg (Calculated) : 70.7  Vital Signs: Temp: 98.8 F (37.1 C) (09/24 0648) Temp Source: Oral (09/24 0648) BP: 130/58 (09/24 0652) Pulse Rate: 66 (09/24 0648)  Labs:  Recent Labs  07/05/16 2120 07/06/16 0148 07/06/16 0706  HGB 10.1*  --  9.0*  HCT 32.2*  --  29.1*  PLT 394  --  355  CREATININE 1.52*  --  1.55*  TROPONINI 0.03* 0.05* 0.03*    Estimated Creatinine Clearance: 38.8 mL/min (by C-G formula based on SCr of 1.55 mg/dL (H)).   Medical History: Past Medical History:  Diagnosis Date  . 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 Closed comminuted intertrochanteric fracture of left femur ([name])   . Diabetes mellitus without complication ([name])   . Diabetic hyperosmolar non-ketotic state ([name]) 05/15/2016  . DKA (diabetic ketoacidoses) ([name]) 05/09/2016  . Hypertension   . Postoperative anemia due to acute blood loss 06/18/2016  . Retroperitoneal hematoma 06/18/2016  . Vitamin B 12 deficiency 06/18/2016   Assessment: [name] [name] is a 84 yoM presenting the ED after a witnessed sycopal episode while sitting in his wheelchair. Vascular ultrasound shows short segment DVT in left posterior tibial veins. Also on plavix and aspiring at home. Hgb 10.1>9.0, PLTC wnl, troponins elevated. No s/sx of bleeding notedD/t plavix/asa, recent retroperitoneal bleed and syncopal episode, will not bolus; instead target the higher end of the rate at 18 units/kg/hr.   Goal of Therapy:  Heparin level 0.3-0.7 units/ml Monitor platelets by anticoagulation protocol: Yes   Plan:  Start heparin 1150 units/hr 2030 HL Daily HL and CBC Monitor signs/symptoms of bleeding  Dierdre Harness, BS, PharmD Clinical Pharmacy Resident [phone] (Pager) 07/06/2016 12:13  PM

## 2016-07-06 NOTE — H&P (Signed)
History and Physical    [name] [name] SHF:[id number] DOB: 11/20/1945 DOA: 07/05/2016  PCP: Mauricio Po, [name]   Patient coming from: SNF  Chief Complaint: Witnessed syncopal event  HPI: [name] [name] is a 70 y.o. gentleman with a history of HTN, DM, prior CVA, and recent admission for orthopedic intervention for left femur fracture with post-op course complicated by retroperitoneal hemorrhage and ABLA requiring transfusion of 2 units of PRBCs.  ASA/plavix held at discharge but eventually resumed.  Based on the [facility] from his SNF, it appears that he has been back on both for at least one week.  Today, he had a witnessed syncopal episode while sitting in his wheelchair.  Reportedly, his eyes rolled to the back of his head and he slumped over for several seconds.  He was brought to the ED for further evaluation and treatment.  He denies any worse pain in his left leg or back since discharge.  He denies chest pain or shortness of breath.  No nausea, vomiting, or diarrhea.  He admits that his appetite has been poor since discharge and he is not eating or drinking like he should.  His urine has been concentrated, but no odor.  He denies LUTS.  He still is not bearing weight on his left leg.  He spends most of his day in a wheelchair, unless he is in physical therapy.  ED Course: EKG shows NSR, no acute ST segment changes.  Chest xray shows progression of a thoracic compression fracture since January; otherwise, no acute process identified.  Troponin 0.03, of unclear significance.  BUN and creatinine elevated from baseline.  Creatinine 1.52; it was 1.14 at discharge earlier this month.  Hgb 10, which appears stable since last discharge.  Review of Systems: As per HPI otherwise 10 point review of systems negative.    Past Medical History:  Diagnosis Date  . 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 Closed comminuted intertrochanteric fracture of left femur  ([place])   . Diabetes mellitus without complication ([place])   . Diabetic hyperosmolar non-ketotic state ([place]) 05/15/2016  . DKA (diabetic ketoacidoses) ([place]) 05/09/2016  . Hypertension   . Postoperative anemia due to acute blood loss 06/18/2016  . Retroperitoneal hematoma 06/18/2016  . Vitamin B 12 deficiency 06/18/2016    Past Surgical History:  Procedure Laterality Date  . HIP ARTHROPLASTY Right 02/05/2013   Procedure: ARTHROPLASTY BIPOLAR HIP;  Surgeon: [name], MD;  Location: [facility];  Service: Orthopedics;  Laterality: Right;  . INTRAMEDULLARY (IM) NAIL INTERTROCHANTERIC Left 06/16/2016   Procedure: INTRAMEDULLARY (IM) NAIL INTERTROCHANTRIC;  Surgeon: Rod Can, MD;  Location: [facility];  Service: Orthopedics;  Laterality: Left;     reports that he quit smoking about 3 years ago. His smoking use included Cigarettes. He has a 7.50 pack-year smoking history. He has never used smokeless tobacco. He reports that he does not drink alcohol or use drugs. He is separated.  He has an adult son and daughter.  He identifies his daughter as his next of kin.  No Known Allergies  Family History  Problem Relation Age of Onset  . Diabetes Mother   . Alzheimer's disease Mother   . Hypertension Mother   . Hyperlipidemia Father   . Hypertension Father   . Healthy Maternal Grandmother   . Pneumonia Maternal Grandfather      Prior to Admission medications   Medication Sig Start Date End Date Taking? Authorizing Provider  acetaminophen (TYLENOL) 325  MG tablet Take 2 tablets (650 mg total) by mouth every 6 (six) hours as needed for mild pain (or Fever >/= 101). 08/14/15  Yes Robbie Lis, MD  amLODipine (NORVASC) 10 MG tablet Take 1 tablet (10 mg total) by mouth daily. 08/14/15  Yes Robbie Lis, MD  aspirin EC 81 MG tablet Take 81 mg by mouth every morning.   Yes Historical Provider, MD  atorvastatin (LIPITOR) 10 MG tablet Take 1 tablet (10 mg total) by mouth daily at 6 PM. 03/23/15  Yes [name], DO  blood glucose meter kit and supplies Use daily to check blood sugar. DX: E11.09 04/24/16  Yes Golden Circle, [name]  carvedilol (COREG) 6.25 MG tablet Take 1 tablet (6.25 mg total) by mouth 2 (two) times daily with a meal. 05/12/16  Yes Thurnell Lose, MD  clopidogrel (PLAVIX) 75 MG tablet Take 75 mg by mouth daily.   Yes Historical Provider, MD  cyanocobalamin (,VITAMIN B-12,) 1000 MCG/ML injection Inject 1 mL (1,000 mcg total) into the muscle every 30 (thirty) days. Patient taking differently: Inject 1,000 mcg into the muscle every Monday.  06/20/16  Yes Christina P Rama, MD  feeding supplement, GLUCERNA SHAKE, (GLUCERNA SHAKE) LIQD Take 237 mLs by mouth 2 (two) times daily between meals. 08/14/15  Yes Robbie Lis, MD  glucose blood (COOL BLOOD GLUCOSE TEST STRIPS) test strip Use as instructed to check blood sugar up to 3 times a day. DX: E11.09 04/24/16  Yes Golden Circle, [name]  insulin aspart (NOVOLOG FLEXPEN) 100 UNIT/ML FlexPen Inject 0-12 Units into the skin 3 (three) times daily with meals. Per sliding scale, 0-150 = 0 units, 151-200 = 2 units, 201-250 = 4 units, 251-300 = 6 units, 301-350 = 8 units, 351-400 = 10 units, 401-500 = 12 units (call MD)   Yes Historical Provider, MD  insulin glargine (LANTUS) 100 UNIT/ML injection Inject 0.08 mLs (8 Units total) into the skin daily. Patient taking differently: Inject 20 Units into the skin daily with supper.  06/20/16  Yes Venetia Maxon Rama, MD  Lancets MISC Use as instructed to check blood sugar up to 3 times a day. DX: E11.09 10/16/15  Yes Golden Circle, [name]  lisinopril (PRINIVIL,ZESTRIL) 20 MG tablet Take 20 mg by mouth daily.   Yes Historical Provider, MD  NOVOLOG FLEXPEN 100 UNIT/ML FlexPen Inject 5 Units into the skin 3 (three) times daily before meals.  06/20/16  Yes Historical Provider, MD  oxyCODONE (OXY IR/ROXICODONE) 5 MG immediate release tablet Take 1 tablet (5 mg total) by mouth every 4 (four) hours as needed for moderate pain.  06/20/16  Yes Christina P Rama, MD  polyethylene glycol powder (GLYCOLAX/MIRALAX) powder Take 17 g by mouth every morning.  06/20/16  Yes Historical Provider, MD  senna (SENOKOT) 8.6 MG TABS tablet Take 1 tablet (8.6 mg total) by mouth 2 (two) times daily. 06/20/16  Yes Venetia Maxon Rama, MD  thiamine 100 MG tablet Take 1 tablet (100 mg total) by mouth daily. 11/03/15  Yes Nat Math, MD    Physical Exam: Vitals:   07/05/16 2320 07/05/16 2321 07/05/16 2330 07/05/16 2345  BP: 116/83  105/73 118/69  Pulse: 63 62 60 61  Resp:  _0 SpO2: 98% 98% 97% 94%  Weight:      Height:          Constitutional: NAD, calm, comfortable Vitals:   07/05/16 2320 07/05/16 2321 07/05/16 2330 07/05/16 2345  BP:  116/83  105/73 118/69  Pulse: 63 62 60 61  Resp:  _0 SpO2: 98% 98% 97% 94%  Weight:      Height:       Eyes: PERRL, lids and conjunctivae normal ENMT: Mucous membranes are moist. Posterior pharynx clear of any exudate or lesions.  Neck: normal appearance, supple Respiratory: clear to auscultation bilaterally, no wheezing, no crackles. Normal respiratory effort. No accessory muscle use.  Cardiovascular: Normal rate, regular rhythm, no murmurs / rubs / gallops. Edema in left leg.  2+ pedal pulses. No carotid bruits.  GI: abdomen is soft and compressible.  No distention.  No tenderness.  No masses palpated.  Bowel sounds are present. Musculoskeletal:  Left leg in brace.  Good ROM in other extremities.  No contractures. Normal muscle tone.  Skin: no rashes, warm and dry Neurologic: No focal deficits. Psychiatric: Normal judgment and insight. Alert and oriented x 3. Normal mood.     Labs on Admission: I have personally reviewed following labs and imaging studies  CBC:  Recent Labs Lab 07/05/16 2120  WBC 7.2  NEUTROABS 4.5  HGB 10.1*  HCT 32.2*  MCV 93.9  PLT 665   Basic Metabolic Panel:  Recent Labs Lab 07/05/16 2120  NA 137  K 4.2  CL 104  CO2 27  GLUCOSE 174*    BUN 23*  CREATININE 1.52*  CALCIUM 9.1   GFR: Estimated Creatinine Clearance: 43.2 mL/min (by C-G formula based on SCr of 1.52 mg/dL (H)). Liver Function Tests:  Recent Labs Lab 07/05/16 2120  AST 20  ALT 11*  ALKPHOS 157*  BILITOT 0.6  PROT 7.1  ALBUMIN 3.0*   Cardiac Enzymes:  Recent Labs Lab 07/05/16 2120  TROPONINI 0.03*   Urine analysis: Pending  Radiological Exams on Admission: Dg Chest 2 View  Result Date: 07/05/2016 CLINICAL DATA:  Shortness of breath. Patient was found outside unresponsive. No injuries. No fall. Slurred speech. EXAM: CHEST  2 VIEW COMPARISON:  06/15/2016 FINDINGS: Normal heart size and pulmonary vascularity. Lungs appear clear and expanded. No focal airspace disease or consolidation. No blunting of costophrenic angles. No pneumothorax. Mediastinal contours appear intact. Tortuous aorta. Anterior wedge deformities of mid thoracic vertebra, probably represent T6 and T7. One of the levels has progressed since previous chest CT from 11/02/2015. IMPRESSION: No evidence of active pulmonary disease. Mid thoracic vertebral compression deformity with progression since 11/02/2015. Electronically Signed   By: Lucienne Capers M.D.   On: 07/05/2016 22:20    EKG: Independently reviewed. NSR.  No acute ST segment elevation.  Assessment/Plan Principal Problem:   Syncope Active Problems:   CKD (chronic kidney disease) stage 3, GFR 30-59 ml/min   Uncontrolled type 2 diabetes mellitus with diabetic nephropathy, with long-term current use of insulin ([name])   Benign essential HTN   Acute on chronic renal failure ([name])   Postoperative anemia due to acute blood loss   Retroperitoneal hematoma   Vitamin B 12 deficiency   Syncope and collapse      Syncope with evidence of AKI and relative hypotension in the ED.  Patient admits to decreased PO intake since discharge. --Admit to telemetry --Gentle hydration with NS --Check orthostatics --Hold Beta blocker and  ACE-I.  Will give half dose of amlodipine in the AM if blood pressure will allow --monitor H/H.  Repeat CT A/P without contrast to assess retroperitoneal bleed.  I am suspicious that H/H is actually going to drop with hydration. --Will also check U/A  Elevated  troponin of unclear clinical significance with patient with AKI --Repeat x 2 to establish trend but low clinical index of suspicion for ACS.  Patient has not had chest pain. --Will defer echo for now because I believe his presenting symptoms are likely explained by other etiologies as outlined above.  DM --Half dose of lantus for now, SSI with meals  Vitamin B12 deficiency --On monthly injections   DVT prophylaxis: SCDs Code Status: FULL Family Communication: Patient alone in the ED at time of admission. Disposition Plan: Back to SNF when ready. Consults called: None Admission status: Inpatient, telemetry.  I expect the patient to be here at least two midnight for evaluation.   TIME SPENT: 60 minutes   Eber Jones MD [facility] Pager [phone]  If 7PM-7AM, please contact night-coverage [email or web address] Password [facility]  07/06/2016, 12:47 AM

## 2016-07-06 NOTE — Progress Notes (Signed)
*  PRELIMINARY RESULTS* Vascular Ultrasound Lower extremity venous duplex has been completed.  Preliminary findings: Short segment DVT noted in the left posterior tibial veins. No DVT RLE.   Called results to [place], RN     Landry Mellow, RDMS, RVT  07/06/2016, 10:44 AM

## 2016-07-06 NOTE — Progress Notes (Signed)
[facility] PROGRESS NOTE  [name] [name] JME:[id number] DOB: 1946-06-13 DOA: 07/05/2016  PCP: Mauricio Po, [name]  Brief History/Interval Summary: 70 year old gentleman with a past medical history of hypertension, diabetes, prior stroke, underwent of surgery for left femur fracture. The postoperative course was complicated by retroperitoneal hemorrhage and acute blood loss anemia requiring 2 units of blood. He was sent to skilled nursing facility. His aspirin and Plavix were resumed after a few days. He presented to the emergency department after having a syncopal episode at the skilled nursing facility while he was sitting on his wheelchair. He was hospitalized for further management.  Reason for Visit: Syncope  Consultants: Phone discussion with orthopedics  Procedures:  Lower extremity venous Doppler shows short segment DVT in the left posterior tibial vein  Antibiotics: None  Subjective/Interval History: Patient denies any chest pain, shortness of breath. He denies any nausea or vomiting. Still has some pain in his left thigh area, but he is feeling much better from that surgery. He tells me that he was anticipating getting out of the rehabilitation facility tomorrow. Hasn't had any further passing out spells.  ROS: Denies any headaches.  Objective:  Vital Signs  Vitals:   07/06/16 0132 07/06/16 0137 07/06/16 0648 07/06/16 0652  BP: 122/70   (!) 130/58  Pulse: (!) 56  66   Resp: 18  18   Temp: 98.2 F (36.8 C)  98.8 F (37.1 C)   TempSrc: Oral  Oral   SpO2: 100%  100%   Weight:  62.7 kg (138 lb 4.8 oz)    Height:        Intake/Output Summary (Last 24 hours) at 07/06/16 1110 Last data filed at 07/06/16 0700  Gross per 24 hour  Intake             1008 ml  Output                0 ml  Net             1008 ml   Filed Weights   07/05/16 2036 07/06/16 0137  Weight: 68.5 kg (151 lb) 62.7 kg (138 lb 4.8 oz)    General appearance: alert, cooperative, appears  stated age and no distress Head: Normocephalic, without obvious abnormality, atraumatic Resp: clear to auscultation bilaterally Cardio: regular rate and rhythm, S1, S2 normal, no murmur, click, rub or gallop GI: soft, non-tender; bowel sounds normal; no masses,  no organomegaly Extremities: His left leg does appear to be more swollen than the right, especially in the thigh area.  Neurologic: Awake and alert. Oriented 3. No cranial nerve deficits. Motor strength is equal bilateral upper and lower extremities.  Lab Results:  Data Reviewed: I have personally reviewed following labs and imaging studies  CBC:  Recent Labs Lab 07/05/16 2120 07/06/16 0706  WBC 7.2 7.4  NEUTROABS 4.5  --   HGB 10.1* 9.0*  HCT 32.2* 29.1*  MCV 93.9 95.7  PLT 394 229    Basic Metabolic Panel:  Recent Labs Lab 07/05/16 2120 07/06/16 0706  NA 137 137  K 4.2 4.6  CL 104 107  CO2 27 24  GLUCOSE 174* 224*  BUN 23* 27*  CREATININE 1.52* 1.55*  CALCIUM 9.1 8.4*    GFR: Estimated Creatinine Clearance: 38.8 mL/min (by C-G formula based on SCr of 1.55 mg/dL (H)).  Liver Function Tests:  Recent Labs Lab 07/05/16 2120  AST 20  ALT 11*  ALKPHOS 157*  BILITOT 0.6  PROT 7.1  ALBUMIN 3.0*    Cardiac Enzymes:  Recent Labs Lab 07/05/16 2120 07/06/16 0148 07/06/16 0706  TROPONINI 0.03* 0.05* 0.03*    CBG:  Recent Labs Lab 07/06/16 0230 07/06/16 0754  GLUCAP 96 203*     Recent Results (from the past 240 hour(s))  MRSA PCR Screening     Status: Abnormal   Collection Time: 07/06/16  2:27 AM  Result Value Ref Range Status   MRSA by PCR INVALID RESULTS, SPECIMEN SENT FOR CULTURE (A) NEGATIVE Final    Comment: RESULT CALLED TO, READ BACK BY AND VERIFIED WITH: T GLENN,RN AT 0539 07/06/16 BY L BENFIELD        The GeneXpert MRSA Assay (FDA approved for NASAL specimens only), is one component of a comprehensive MRSA colonization surveillance program. It is not intended to diagnose  MRSA infection nor to guide or monitor treatment for MRSA infections.       Radiology Studies: Ct Abdomen Pelvis Wo Contrast  Result Date: 07/06/2016 CLINICAL DATA:  Patient is syncopal after being placed back on aspirin and Plavix. History of intramedullary nail placement 06/16/2016 and retroperitoneal hematoma diagnosed on 06/17/2016 CT. Reassess retroperitoneal hemorrhage. Go EXAM: CT ABDOMEN AND PELVIS WITHOUT CONTRAST TECHNIQUE: Multidetector CT imaging of the abdomen and pelvis was performed following the standard protocol without IV contrast. COMPARISON:  06/17/2016 CT O FINDINGS: Lower chest: Bibasilar dependent atelectasis. Partial clearing of a small focus of right lower lobe atelectasis/consolidation. No pneumothorax, effusion or pneumonic consolidations. Minimal pleural thickening along the right major fissure. Hepatobiliary: No focal liver abnormality is seen. No gallstones, gallbladder wall thickening, or biliary dilatation. Pancreas: Unremarkable. No pancreatic ductal dilatation or surrounding inflammatory changes. Spleen: Trace linear calcifications along a splenic cleft. No splenomegaly. No subcapsular fluid. Adrenals/Urinary Tract: Stable left 14 mm interpolar and 12 mm lower pole renal cysts. No genitourinary calculi nor obstructive uropathy. Normal appearance of the bladder. The Stomach/Bowel: Normal bowel rotation. No bowel obstruction or acute inflammation. Moderate amount of fecal residue within large bowel. Normal appendix. Vascular/Lymphatic: Stable scattered atherosclerotic calcifications noted along the course the normal calibered aorta and common iliacs. No lymphadenopathy. Reproductive: Prostate obscured by streak artifacts from the patient's right hip arthroplasty and left dynamic compression screw fixation hardware. Other: No evidence of retroperitoneal hemorrhage. Musculoskeletal: Resolution of left iliopsoas and left piriformis intramuscular hematomas. Small fat containing  umbilical hernia. Vacuum disc phenomena at L4-5 and L5-S1 with disc space narrowing. Broad-based disc-osteophyte complexes are seen at these levels. Moderate canal stenosis at L5-S1. Marked L4-5 and L5-S1 neural foraminal narrowing. No acute osseous abnormality. IMPRESSION: No recurrent retroperitoneal hemorrhage. Resolution of left-sided iliopsoas and piriformis hematomas. Electronically Signed   By: Ashley Royalty M.D.   On: 07/06/2016 11:03   Dg Chest 2 View  Result Date: 07/05/2016 CLINICAL DATA:  Shortness of breath. Patient was found outside unresponsive. No injuries. No fall. Slurred speech. EXAM: CHEST  2 VIEW COMPARISON:  06/15/2016 FINDINGS: Normal heart size and pulmonary vascularity. Lungs appear clear and expanded. No focal airspace disease or consolidation. No blunting of costophrenic angles. No pneumothorax. Mediastinal contours appear intact. Tortuous aorta. Anterior wedge deformities of mid thoracic vertebra, probably represent T6 and T7. One of the levels has progressed since previous chest CT from 11/02/2015. IMPRESSION: No evidence of active pulmonary disease. Mid thoracic vertebral compression deformity with progression since 11/02/2015. Electronically Signed   By: Lucienne Capers M.D.   On: 07/05/2016 22:20     Medications:  Scheduled: . amLODipine  5 mg Oral Daily  .  aspirin EC  81 mg Oral Daily  . atorvastatin  10 mg Oral q1800  . clopidogrel  75 mg Oral Daily  . insulin aspart  0-9 Units Subcutaneous TID WC  . insulin glargine  10 Units Subcutaneous Q supper  . senna  1 tablet Oral BID  . sodium chloride flush  3 mL Intravenous Q12H  . thiamine  100 mg Oral Daily   Continuous: . sodium chloride 100 mL/hr at 07/06/16 0157   IRS:[id number] **OR** acetaminophen, ondansetron **OR** ondansetron (ZOFRAN) IV, oxyCODONE  Assessment/Plan:  Principal Problem:   Syncope Active Problems:   CKD (chronic kidney disease) stage 3, GFR 30-59 ml/min   Uncontrolled type 2  diabetes mellitus with diabetic nephropathy, with long-term current use of insulin ([name])   Benign essential HTN   Acute on chronic renal failure ([name])   Postoperative anemia due to acute blood loss   Retroperitoneal hematoma   Vitamin B 12 deficiency   Syncope and collapse    Left leg DVT I did discuss the situation with Dr. Susa Raring who is covering for [facility] orthopedics. This conversation was because of his recent, the surgery and postoperative bleeding and hematoma and the need for anticoagulation. He states it would be okay for this patient to receive anticoagulation at this time. We will initiate IV heparin for now. We will wait on VQ scan. He will be transitioned over to oral anticoagulation tomorrow. Will need to see how his renal function is prior to deciding what agent to utilize.  Syncope This occurred while he was sitting on the wheelchair. He does not have any neurological deficits. His blood work did not show anything that would cause his syncope while he was sitting. PE is in the differential. He does have a left leg DVT. VQ scan is pending. He will be started on anticoagulation as discussed above.  Mild acute on chronic kidney disease stage III. Creatinine is slightly higher compared to what it was weeks ago, but he has had much higher creatinine in the past. His baseline is really unknown. Monitor urine output. Avoid nephrotoxic agents.  Mildly elevated troponin. Significance is unclear. EKG does not show any ischemic changes. Troponins are very low level. Do not anticipate any further testing at this time. He is chest pain-free. Follow-up on echocardiogram.  History of essential hypertension. Blood pressure medications were held because he was hypotensive in the emergency department. Blood pressures have improved and stable. Continue to monitor.  Recent mild retroperitoneal hemorrhage and iliopsoas hematoma. CT scan that this morning shows resolution of these findings and  hematoma's. Hemoglobin is low but stable.  Normocytic Anemia Recent acute blood loss anemia as mentioned above. Monitor hemoglobin and transfuse as needed. No evidence for overt bleeding. Monitor while he is being anticoagulated.  Diabetes mellitus type 2. Continue with Lantus. Sliding scale coverage. Check CBGs.  History of vitamin B-12 deficiency. Continue with monthly injections as before.  History of stroke. He is on aspirin, Plavix and statin for same. Unclear when he was started on Plavix. He does not have any history of coronary artery disease. No history of stent placement. His stroke was in June 2016 and he was actually discharged on aspirin at that time. He will need to be sent out on oral anticoagulation for his DVT as discussed above. Will need to stop his antiplatelet agents.  DVT Prophylaxis: Will be on IV heparin    Code Status: Full code  Family Communication: Discussed with the patient  Disposition Plan: IV  heparin to be initiated today. Await VQ scan. Mobilized by PT tomorrow. Anticipate discharge in the next 1-2 days.    LOS: 1 day   [facility] Pager [phone] 07/06/2016, 11:10 AM  If 7PM-7AM, please contact night-coverage at [email or web address], password [facility]

## 2016-07-07 ENCOUNTER — Inpatient Hospital Stay (HOSPITAL_COMMUNITY): Payer: Medicare HMO

## 2016-07-07 DIAGNOSIS — I82442 Acute embolism and thrombosis of left tibial vein: Secondary | ICD-10-CM

## 2016-07-07 DIAGNOSIS — R55 Syncope and collapse: Secondary | ICD-10-CM

## 2016-07-07 LAB — GLUCOSE, CAPILLARY
Glucose-Capillary: 107 mg/dL — ABNORMAL HIGH (ref 65–99)
Glucose-Capillary: 440 mg/dL — ABNORMAL HIGH (ref 65–99)
Glucose-Capillary: 216 mg/dL — ABNORMAL HIGH (ref 65–99)
Glucose-Capillary: 155 mg/dL — ABNORMAL HIGH (ref 65–99)

## 2016-07-07 LAB — CBC
HCT: 33.6 % — ABNORMAL LOW (ref 39.0–52.0)
Hemoglobin: 10.5 g/dL — ABNORMAL LOW (ref 13.0–17.0)
MCH: 29.4 pg (ref 26.0–34.0)
MCHC: 31.3 g/dL (ref 30.0–36.0)
MCV: 94.1 fL (ref 78.0–100.0)
Platelets: 338 10*3/uL (ref 150–400)
RBC: 3.57 MIL/uL — ABNORMAL LOW (ref 4.22–5.81)
RDW: 13.6 % (ref 11.5–15.5)
WBC: 6.9 10*3/uL (ref 4.0–10.5)

## 2016-07-07 LAB — BASIC METABOLIC PANEL
Anion gap: 8 (ref 5–15)
BUN: 16 mg/dL (ref 6–20)
CO2: 25 mmol/L (ref 22–32)
Calcium: 9.1 mg/dL (ref 8.9–10.3)
Chloride: 106 mmol/L (ref 101–111)
Creatinine, Ser: 1.05 mg/dL (ref 0.61–1.24)
GFR calc Af Amer: >60 mL/min (ref >60)
GFR calc non Af Amer: >60 mL/min (ref >60)
Glucose, Bld: 87 mg/dL (ref 65–99)
Potassium: 4.3 mmol/L (ref 3.5–5.1)
Sodium: 139 mmol/L (ref 135–145)

## 2016-07-07 LAB — ECHOCARDIOGRAM COMPLETE
E decel time: 419 msec
E/e' ratio: 7.43
FS: 35 % (ref 28–44)
Height: 69 in
IVS/LV PW RATIO, ED: 1.1
LA ID, A-P, ES: 27 mm
LA diam end sys: 27 mm
LA diam index: 1.52 cm/m2
LA vol A4C: 33.8 ml
LA vol index: 22.4 mL/m2
LA vol: 39.8 mL
LV E/e' medial: 7.43
LV E/e'average: 7.43
LV PW d: 11.8 mm — AB (ref 0.6–1.1)
LV e' LATERAL: 8.16 cm/s
LVOT SV: 62 mL
LVOT VTI: 19.8 cm
LVOT area: 3.14 cm2
LVOT diameter: 20 mm
LVOT peak vel: 85.8 cm/s
Lateral S' vel: 16 cm/s
MV Dec: 419
MV pk A vel: 93.6 m/s
MV pk E vel: 60.6 m/s
RV sys press: 10 mmHg
Reg peak vel: 131 cm/s
TDI e' lateral: 8.16
TDI e' medial: 6.53
TR max vel: 131 cm/s
Weight: 2246.93 oz

## 2016-07-07 LAB — HEPARIN LEVEL (UNFRACTIONATED)
Heparin Unfractionated: 0.73 IU/mL — ABNORMAL HIGH (ref 0.30–0.70)
Heparin Unfractionated: >2.2 IU/mL — ABNORMAL HIGH (ref 0.30–0.70)

## 2016-07-07 MED ORDER — RIVAROXABAN 20 MG PO TABS: 20.0000 mg | ORAL_TABLET | Freq: Every day | ORAL | Status: DC

## 2016-07-07 MED ORDER — LISINOPRIL 10 MG PO TABS
10.0000 mg | ORAL_TABLET | Freq: Every day | ORAL | Status: DC
Start: 2016-07-07 — End: 2016-08-19

## 2016-07-07 MED ORDER — RIVAROXABAN 15 MG PO TABS: ORAL_TABLET | ORAL | Status: DC

## 2016-07-07 MED ORDER — OXYCODONE HCL 5 MG PO TABS: 5.0000 mg | ORAL_TABLET | ORAL | 0 refills | Status: DC | PRN

## 2016-07-07 MED ORDER — INSULIN ASPART 100 UNIT/ML ~~LOC~~ SOLN
15.0000 [IU] | Freq: Once | SUBCUTANEOUS | Status: AC
  Administered 2016-07-07: 15 [IU] via SUBCUTANEOUS

## 2016-07-07 MED ORDER — ENSURE ENLIVE PO LIQD
237.0000 mL | Freq: Two times a day (BID) | ORAL | Status: DC
  Administered 2016-07-07: 237 mL via ORAL

## 2016-07-07 MED ORDER — RIVAROXABAN 15 MG PO TABS
15.0000 mg | ORAL_TABLET | Freq: Two times a day (BID) | ORAL | Status: DC
  Administered 2016-07-07 (×2): 15 mg via ORAL
  Filled 2016-07-07 (×2): qty 1

## 2016-07-07 NOTE — Evaluation (Signed)
Occupational Therapy Evaluation Patient Details Name: [name] [name] MRN: [id number] DOB: 03/16/1946 Today's Date: 07/07/2016    History of Present Illness [name] [name] a 70 y.o.gentleman with a history of HTN, DM, prior CVA, and recent admission for orthopedic intervention for left femur fracture with post-op course complicated by retroperitoneal hemorrhage and ABLA requiring transfusion of 2 units of PRBCs.Today, he had a witnessed syncopal episode while sitting in his wheelchair. Reportedly, his eyes rolled to the back of his head and he slumped over for several seconds.    Clinical Impression   Pt previously at SNF participating in rehab PTA. Pt is at min guard A level with mobility using RW and mod - max A with LB ADLs. No further acute OT is indicates at this time, defer further OT intervention to SNF. Pt to continue with acute PT services    Follow Up Recommendations  SNF;Supervision/Assistance - 24 hour    Equipment Recommendations  Other (comment) (TBD at next venue of care)    Recommendations for Other Services       Precautions / Restrictions Precautions Precautions: Fall Restrictions Weight Bearing Restrictions: Yes LLE Weight Bearing: Partial weight bearing Other Position/Activity Restrictions: taken from previous admission, no orders currently      Mobility Bed Mobility Overal bed mobility: Modified Independent Bed Mobility: Supine to Sit;Sit to Supine     Supine to sit: Modified independent (Device/Increase time) Sit to supine: Modified independent (Device/Increase time)   General bed mobility comments: used long sit, indep L LE management, used bed rail  Transfers Overall transfer level: Needs assistance Equipment used: Rolling walker (2 wheeled) Transfers: Sit to/from Stand Sit to Stand: Min guard         General transfer comment: v/c's for safe hand placement and L LE PWB adherence    Balance Overall balance assessment: Needs  assistance Sitting-balance support: No upper extremity supported Sitting balance-Leahy Scale: Good     Standing balance support: Bilateral upper extremity supported Standing balance-Leahy Scale: Poor Standing balance comment: needs RW due to L LE PWB                            ADL Overall ADL's : Needs assistance/impaired     Grooming: Wash/dry hands;Wash/dry face;Minimal assistance;Standing   Upper Body Bathing: Supervision/ safety;Sitting;Set up   Lower Body Bathing: Moderate assistance;Sit to/from stand   Upper Body Dressing : Set up;Sitting;Supervision/safety   Lower Body Dressing: Maximal assistance;Sit to/from stand   Toilet Transfer: Min guard;RW;Ambulation;Comfort height toilet;Grab bars;Cueing for Office manager Details (indicate cue type and reason): cues for safety, correct hand placment and technique Toileting- Clothing Manipulation and Hygiene: Moderate assistance;Sit to/from stand       Functional mobility during ADLs: Rolling walker;Min guard;Cueing for safety       Vision Vision Assessment?: No apparent visual deficits              Pertinent Vitals/Pain Pain Assessment: 0-10 Pain Score: 7  Pain Location: L LE Pain Descriptors / Indicators: Throbbing;Constant Pain Intervention(s): Monitored during session;Premedicated before session;Repositioned     Hand Dominance Right   Extremity/Trunk Assessment Upper Extremity Assessment Upper Extremity Assessment: Generalized weakness   Lower Extremity Assessment Lower Extremity Assessment: Defer to PT evaluation LLE Deficits / Details: generalized weakness but able to do full LAQ   Cervical / Trunk Assessment Cervical / Trunk Assessment: Normal   Communication Communication Communication: No difficulties   Cognition Arousal/Alertness: Awake/alert Behavior During Therapy:  WFL for tasks assessed/performed Overall Cognitive Status: Within Functional Limits for tasks assessed                      General Comments   pt very pleasant and cooperative, fatigues easily                Home Living Family/patient expects to be discharged to:: Skilled nursing facility Living Arrangements: Alone                               Additional Comments: pt was at home until fall, had recent admission and was dc's to [facility] care. Pt to return to [facility] upon d/c for con't PT      Prior Functioning/Environment Level of Independence: Needs assistance  Gait / Transfers Assistance Needed: needs RW, working with PT ADL's / Homemaking Assistance Needed: needs assist for ADLs from RN staff            OT Problem List: Decreased strength;Decreased activity tolerance;Impaired balance (sitting and/or standing);Decreased knowledge of use of DME or AE;Pain   OT Treatment/Interventions: Self-care/ADL training;DME and/or AE instruction;Therapeutic activities    OT Goals(Current goals can be found in the care plan section) Acute Rehab OT Goals Patient Stated Goal: go back to rehab and then go home  OT Frequency:     Barriers to D/C: Decreased caregiver support;Inaccessible home environment                        End of Session Equipment Utilized During Treatment: Gait belt;Rolling walker  Activity Tolerance: Patient tolerated treatment well;Patient limited by fatigue Patient left: in bed;with call bell/phone within reach;with bed alarm set;Other (comment) ([place] technician in to see pt)   Time: 6945-0388 OT Time Calculation (min): 29 min Charges:  OT Evaluation $OT Eval Low Complexity: 1 Procedure OT Treatments $Therapeutic Activity: 8-22 mins G-Codes:    Britt Bottom 07/07/2016, 11:49 AM

## 2016-07-07 NOTE — Discharge Instructions (Signed)
Information on my medicine - XARELTO (rivaroxaban)  This medication education was reviewed with me or my healthcare representative as part of my discharge preparation.  The pharmacist that spoke with me during my hospital stay was:  Tad Moore, [place]? Xarelto was prescribed to treat blood clots that may have been found in the veins of your legs (deep vein thrombosis) or in your lungs (pulmonary embolism) and to reduce the risk of them occurring again.  What do you need to know about Xarelto? The starting dose is one 15 mg tablet taken TWICE daily with food for the FIRST 21 DAYS then on October 16  the dose is changed to one 20 mg tablet taken ONCE A DAY with your evening meal.  DO NOT stop taking Xarelto without talking to the health care provider who prescribed the medication.  Refill your prescription for 20 mg tablets before you run out.  After discharge, you should have regular check-up appointments with your healthcare provider that is prescribing your Xarelto.  In the future your dose may need to be changed if your kidney function changes by a significant amount.  What do you do if you miss a dose? If you are taking Xarelto TWICE DAILY and you miss a dose, take it as soon as you remember. You may take two 15 mg tablets (total 30 mg) at the same time then resume your regularly scheduled 15 mg twice daily the next day.  If you are taking Xarelto ONCE DAILY and you miss a dose, take it as soon as you remember on the same day then continue your regularly scheduled once daily regimen the next day. Do not take two doses of Xarelto at the same time.   Important Safety Information Xarelto is a blood thinner medicine that can cause bleeding. You should call your healthcare provider right away if you experience any of the following: ? Bleeding from an injury or your nose that does not stop. ? Unusual colored urine (red or dark brown) or unusual  colored stools (red or black). ? Unusual bruising for unknown reasons. ? A serious fall or if you hit your head (even if there is no bleeding).  Some medicines may interact with Xarelto and might increase your risk of bleeding while on Xarelto. To help avoid this, consult your healthcare provider or pharmacist prior to using any new prescription or non-prescription medications, including herbals, vitamins, non-steroidal anti-inflammatory drugs (NSAIDs) and supplements.  This website has more information on Xarelto: [email or web address].[email or web address]

## 2016-07-07 NOTE — Care Management Note (Signed)
Case Management Note  Patient Details  Name: [name] [name] MRN: [id number] Date of Birth: 1946/08/24  Subjective/Objective:                 DVT. From ALF.   Action/Plan:  DC to SNF today as facilitated by CSW.  Expected Discharge Date:                  Expected Discharge Plan:  Skilled Nursing Facility  In-House Referral:  Clinical Social Work  Discharge planning Services  CM Consult  Post Acute Care Choice:  NA Choice offered to:  NA  DME Arranged:  N/A DME Agency:  NA  HH Arranged:  NA HH Agency:  NA  Status of Service:  Completed, signed off  If discussed at [place] of Stay Meetings, dates discussed:    Additional Comments:  Carles Collet, RN 07/07/2016, 1:10 PM

## 2016-07-07 NOTE — Clinical Social Work Note (Signed)
Clinical Social Work Assessment  Patient Details  Name: [name] [name] MRN: [id number] Date of Birth: [birth year]  Date of referral:  07/07/16               Reason for consult:  Facility Placement                Permission sought to share information with:  Facility Art therapist granted to share information::  Yes, Verbal Permission Granted  Name::        Agency::  SNFs  Relationship::     Contact Information:     Housing/Transportation Living arrangements for the past 2 months:  Single Family Home, [place] of Information:  Patient Patient Interpreter Needed:  None Criminal Activity/Legal Involvement Pertinent to Current Situation/Hospitalization:  No - Comment as needed Significant Relationships:  Adult Children, Siblings Lives with:  Self Do you feel safe going back to the place where you live?  Yes Need for family participation in patient care:  No (Coment)  Care giving concerns:  CSW received consult for discharge planning. Patient stated he came from [facility]. Patient reported that he would rather return home, but he understands that he may need to do additional rehab before going home alone. CSW to continue to follow and assist with discharge planning needs.   Social Worker assessment / plan:  Patient to return to [facility] if insurance approves SNF.  Employment status:  Retired Nurse, adult PT Recommendations:  [place] / Referral to community resources:  [place]  Patient/Family's Response to care:  Patient expresses understanding of discharge plan and requests a walker when he leaves SNF.   Patient/Family's Understanding of and Emotional Response to Diagnosis, Current Treatment, and Prognosis:  Patient/family is realistic regarding therapy needs and expressed being hopeful for SNF placement. Patient expressed understanding of CSW role and discharge  process. No questions/concerns about plan or treatment.    Emotional Assessment Appearance:  Appears stated age Attitude/Demeanor/Rapport:  Other (Appropriate) Affect (typically observed):  Accepting, Appropriate Orientation:  Oriented to Self, Oriented to Situation, Oriented to Place, Oriented to  Time Alcohol / Substance use:  Not Applicable Psych involvement (Current and /or in the community):  No (Comment)  Discharge Needs  Concerns to be addressed:  Care Coordination Readmission within the last 30 days:  Yes Current discharge risk:  None Barriers to Discharge:  No Barriers Identified   Benard Halsted, [place] 07/07/2016, 2:12 PM

## 2016-07-07 NOTE — Progress Notes (Signed)
[name] to be D/C'd to SNF per MD order.  Discussed with the patient and all questions fully answered.  VSS, Skin clean, dry and intact without evidence of skin break down, no evidence of skin tears noted. IV catheter discontinued intact. Site without signs and symptoms of complications. Dressing and pressure applied.  An After Visit Summary was printed and given to the patient. PTAR received prescription.  D/c education completed with patient/family including follow up instructions, medication list, d/c activities limitations if indicated, with other d/c instructions as indicated by MD - patient able to verbalize understanding, all questions fully answered.   Patient instructed to return to ED, call 911, or call MD for any changes in condition.   Patient escorted via stretcher, and D/C to SNF via private auto.  Lynann Beaver 07/07/2016 7:34 PM

## 2016-07-07 NOTE — Progress Notes (Signed)
  Echocardiogram 2D Echocardiogram has been performed.  Darlina Sicilian M 07/07/2016, 11:57 AM

## 2016-07-07 NOTE — Progress Notes (Addendum)
ANTICOAGULATION CONSULT NOTE  Pharmacy Consult for Heparin Indication: DVT  No Known Allergies  Patient Measurements: Height: 5\' 9"  (175.3 cm) Weight: 140 lb 6.9 oz (63.7 kg) IBW/kg (Calculated) : 70.7  Vital Signs: Temp: 98.5 F (36.9 C) (09/25 0539) Temp Source: Oral (09/25 0539) BP: 130/62 (09/25 0539) Pulse Rate: 58 (09/25 0539)  Labs:  Recent Labs  07/05/16 2120 07/06/16 0148 07/06/16 0706 07/06/16 1357 07/06/16 2053 07/07/16 0721  HGB 10.1*  --  9.0*  --   --  10.5*  HCT 32.2*  --  29.1*  --   --  33.6*  PLT 394  --  355  --   --  338  HEPARINUNFRC  --   --   --   --  0.39 0.73*  CREATININE 1.52*  --  1.55*  --   --   --   TROPONINI 0.03* 0.05* 0.03* <0.03  --   --     Estimated Creatinine Clearance: 39.4 mL/min (by C-G formula based on SCr of 1.55 mg/dL (H)).   Assessment: [name] [name] is a 26 yoM presenting the ED after a witnessed sycopal episode while sitting in his wheelchair. Vascular ultrasound shows short segment DVT in left posterior tibial veins. Also on plavix and aspirin at home.   Heparin level is supratherapeutic at 0.73 on 1150 units/hr. Hgb low stable, PLTC wnl, troponins elevated. No s/sx of bleeding noted.   Goal of Therapy:  Heparin level 0.3-0.7 units/ml Monitor platelets by anticoagulation protocol: Yes   Plan:  - Decrease heparin drip to 1050 units/hr  - 8 hr heparin level - No plan to bolus with DAPT and recent retroperitoneal bleed - Daily heparin level and CBC - Monitor signs/symptoms of bleeding - F/U plan to start oral anticoagulant - Consider stopping either aspirin or Plavix once an oral anticoagulant is started to minimize bleeding risk  Renold Genta, PharmD, BCPS Clinical Pharmacist Phone for today - [place] - [phone] 07/07/2016 8:32 AM   Addendum: Consulted to change to Xarelto.  - Discontinue heparin drip - Xarelto 15 mg PO bid with meals for 21 days then 20 mg PO daily with supper (starts 07/28/16) -  first dose due at the time heparin turned off - Educate prior to discharge - Pharmacy signing off but will follow peripherally  Renold Genta, PharmD, BCPS Clinical Pharmacist Phone for today - [place] - [phone] 07/07/2016 10:26 AM

## 2016-07-07 NOTE — Evaluation (Signed)
Physical Therapy Evaluation Patient Details Name: [name] [name] MRN: [id number] DOB: [birth year] Today's Date: 07/07/2016   History of Present Illness  [name] [name] a 70 y.o.gentleman with a history of HTN, DM, prior CVA, and recent admission for orthopedic intervention for left femur fracture with post-op course complicated by retroperitoneal hemorrhage and ABLA requiring transfusion of 2 units of PRBCs.Today, he had a witnessed syncopal episode while sitting in his wheelchair. Reportedly, his eyes rolled to the back of his head and he slumped over for several seconds.   Clinical Impression  Pt admitted with above. Pt functioning at minA level. Pt con't to benefit from SNF upon d/c to achieve safe mod I level of function to transition home. Pt able to adhere to L LE PWB. Acute PT to con't to follow.    Follow Up Recommendations SNF;Supervision for mobility/OOB    Equipment Recommendations  Rolling walker with 5" wheels    Recommendations for Other Services       Precautions / Restrictions Precautions Precautions: Fall Restrictions Weight Bearing Restrictions: Yes LLE Weight Bearing: Partial weight bearing Other Position/Activity Restrictions: taken from previous admission, no orders currently      Mobility  Bed Mobility Overal bed mobility: Modified Independent Bed Mobility: Supine to Sit     Supine to sit: Modified independent (Device/Increase time)     General bed mobility comments: used long sit, indep L LE management, used bed rail  Transfers Overall transfer level: Needs assistance Equipment used: Rolling walker (2 wheeled) Transfers: Sit to/from Stand Sit to Stand: Min guard         General transfer comment: v/c's for safe hand placement and L LE PWB adherence  Ambulation/Gait Ambulation/Gait assistance: Min assist Ambulation Distance (Feet): 60 Feet Assistive device: Rolling walker (2 wheeled) Gait Pattern/deviations: Step-to pattern Gait velocity: dec   General Gait Details: Pt attempted swing through gait pattern, pt educated to lead with L foot and place ball of foot down to WB, <30%. pt with improved gait pattern, noted increased bilat UE WBing  Stairs            Wheelchair Mobility    Modified Rankin (Stroke Patients Only)       Balance Overall balance assessment: Needs assistance Sitting-balance support: No upper extremity supported Sitting balance-Leahy Scale: Good     Standing balance support: Bilateral upper extremity supported Standing balance-Leahy Scale: Poor Standing balance comment: needs RW due to L LE PWB                             Pertinent Vitals/Pain Pain Assessment: 0-10 Pain Score: 9  Pain Location: L LE Pain Descriptors / Indicators: Constant Pain Intervention(s): Patient requesting pain meds-RN notified    Home Living Family/patient expects to be discharged to:: Skilled nursing facility                 Additional Comments: pt was at home until fall, had recent admission and was dc's to [facility]. Pt to return to [facility] upon d/c for con't PT    Prior Function Level of Independence: Needs assistance   Gait / Transfers Assistance Needed: needs RW, working with PT  ADL's / Homemaking Assistance Needed: needs assist for ADLs from RN staff        Hand Dominance   Dominant Hand: Right    Extremity/Trunk Assessment   Upper Extremity Assessment: Overall WFL for tasks assessed  Lower Extremity Assessment: LLE deficits/detail   LLE Deficits / Details: generalized weakness but able to do full LAQ  Cervical / Trunk Assessment: Normal  Communication   Communication: No difficulties  Cognition Arousal/Alertness: Awake/alert Behavior During Therapy: WFL for tasks assessed/performed Overall Cognitive Status: Within Functional Limits for tasks assessed                      General Comments      Exercises General Exercises -  Lower Extremity Long Arc Quad: AROM;Left;10 reps;Seated   Assessment/Plan    PT Assessment Patient needs continued PT services  PT Problem List Decreased strength;Decreased mobility;Decreased knowledge of precautions;Decreased range of motion;Decreased activity tolerance;Decreased cognition;Pain;Decreased balance;Decreased knowledge of use of DME          PT Treatment Interventions DME instruction;Therapeutic activities;Gait training;Therapeutic exercise;Patient/family education;Balance training;Functional mobility training    PT Goals (Current goals can be found in the Care Plan section)  Acute Rehab PT Goals Patient Stated Goal: heal PT Goal Formulation: With patient Time For Goal Achievement: 07/14/16 Potential to Achieve Goals: Good Additional Goals Additional Goal #1: Pt to maintain L LE PWB 100% of time.    Frequency Min 3X/week   Barriers to discharge        Co-evaluation               End of Session Equipment Utilized During Treatment: Gait belt Activity Tolerance: Patient tolerated treatment well Patient left: in chair;with call bell/phone within reach;with chair alarm set Nurse Communication: Mobility status         Time: 4235-3614 PT Time Calculation (min) (ACUTE ONLY): 15 min   Charges:   PT Evaluation $PT Eval Low Complexity: 1 Procedure     PT G CodesKingsley Callander 07/07/2016, 9:16 AM   Kittie Plater, PT, DPT Pager #: [phone] Office #: [phone]

## 2016-07-07 NOTE — Progress Notes (Signed)
Patient will DC to: [facility] Anticipated DC date: 07/07/16 Family notified: N/A Transport by: Corey Harold   Per MD patient ready for DC to [facility] Josem Kaufmann received). RN, patient, patient's family, and facility notified of DC. Discharge Summary sent to facility. RN given number for report. DC packet on chart. Ambulance transport requested for patient.   CSW signing off.  Cedric Fishman, [place] Social Worker [phone]

## 2016-07-07 NOTE — NC FL2 (Signed)
[place] LEVEL OF CARE SCREENING TOOL     IDENTIFICATION  Patient Name: [name] [name] Birthdate: 03-30-1946 Sex: male Admission Date (Current Location): 07/05/2016  [facility] and [place] Number:  Herbalist and Address:  [facility], [place] [address] [place], [place] [address]      Provider Number: [id number]  Attending Physician Name and Address:  Bonnielee Haff, MD  Relative Name and Phone Number:       Current Level of Care: Hospital Recommended Level of Care: [place] Prior Approval Number:    Date Approved/Denied:   PASRR Number:    Discharge Plan: SNF    Current Diagnoses: Patient Active Problem List   Diagnosis Date Noted  . Syncope and collapse 07/06/2016  . Left leg DVT ([place]) 07/06/2016  . Syncope 07/05/2016  . Postoperative anemia due to acute blood loss 06/18/2016  . Retroperitoneal hematoma 06/18/2016  . Vitamin B 12 deficiency 06/18/2016  . Elective surgery   . Postop check   . Closed comminuted intertrochanteric fracture of left femur ([place])   . Renal insufficiency   . Hyponatremia 05/15/2016  . Anxiety 05/15/2016  . History of CVA (cerebrovascular accident) 05/15/2016  . Acute on chronic renal failure ([place]) 05/09/2016  . Hypoglycemia 11/02/2015  . Chronic prescription benzodiazepine use 11/02/2015  . Narcotic dependency, continuous ([place]) 11/02/2015  . Uncontrolled type 2 diabetes mellitus with diabetic nephropathy, with long-term current use of insulin ([place]) 08/12/2015  . Benign essential HTN 08/12/2015  . Dyslipidemia associated with type 2 diabetes mellitus ([place]) 08/12/2015  . Depression 08/12/2015  . CKD (chronic kidney disease) stage 3, GFR 30-59 ml/min 03/20/2014    Orientation RESPIRATION BLADDER Height & Weight     Self, Time, Situation, Place  Normal Continent Weight: 63.7 kg (140 lb 6.9 oz) Height:  5\' 9"  (175.3 cm)  BEHAVIORAL SYMPTOMS/MOOD NEUROLOGICAL BOWEL NUTRITION  STATUS      Continent Diet (Please see DC Summary)  AMBULATORY STATUS COMMUNICATION OF NEEDS Skin   Extensive Assist Verbally Surgical wounds (Closed incision on thigh)                       Personal Care Assistance Level of Assistance  Bathing, Feeding, Dressing Bathing Assistance: Maximum assistance Feeding assistance: Independent Dressing Assistance: Maximum assistance     Functional Limitations Info             SPECIAL CARE FACTORS FREQUENCY  PT (By licensed PT)     PT Frequency: 5x/week              Contractures      Additional Factors Info  Code Status, Allergies Code Status Info: Full Allergies Info: NKA           Current Medications (07/07/2016):  This is the current hospital active medication list Current Facility-Administered Medications  Medication Dose Route Frequency Provider Last Rate Last Dose  . acetaminophen (TYLENOL) tablet 650 mg  650 mg Oral Q6H PRN Lily Kocher, MD   650 mg at 07/07/16 0446   Or  . acetaminophen (TYLENOL) suppository 650 mg  650 mg Rectal Q6H PRN Lily Kocher, MD      . amLODipine (NORVASC) tablet 5 mg  5 mg Oral Daily Lily Kocher, MD   5 mg at 07/07/16 1006  . atorvastatin (LIPITOR) tablet 10 mg  10 mg Oral q1800 Lily Kocher, MD   10 mg at 07/06/16 1714  . feeding supplement (ENSURE ENLIVE) (ENSURE  ENLIVE) liquid 237 mL  237 mL Oral BID BM Bonnielee Haff, MD   237 mL at 07/07/16 1007  . insulin aspart (novoLOG) injection 0-9 Units  0-9 Units Subcutaneous TID WC Lily Kocher, MD   3 Units at 07/06/16 1714  . insulin glargine (LANTUS) injection 10 Units  10 Units Subcutaneous Q supper Lily Kocher, MD   10 Units at 07/06/16 1856  . ondansetron (ZOFRAN) tablet 4 mg  4 mg Oral Q6H PRN Lily Kocher, MD       Or  . ondansetron [facility]) injection 4 mg  4 mg Intravenous Q6H PRN Lily Kocher, MD      . oxyCODONE (Oxy IR/ROXICODONE) immediate release tablet 5 mg  5 mg Oral Q4H PRN Lily Kocher, MD   5 mg at 07/07/16 1005  .  Rivaroxaban (XARELTO) tablet 15 mg  15 mg Oral BID WC [place], [facility]      . senna (SENOKOT) tablet 8.6 mg  1 tablet Oral BID Lily Kocher, MD   8.6 mg at 07/07/16 1005  . sodium chloride flush (NS) 0.9 % injection 3 mL  3 mL Intravenous Q12H Lily Kocher, MD   3 mL at 07/06/16 2256  . technetium TC 67M diethylenetriame-pentaacetic acid (DTPA) injection 32 millicurie  32 millicurie Inhalation Once PRN Abigail Miyamoto, MD      . thiamine (VITAMIN B-1) tablet 100 mg  100 mg Oral Daily Lily Kocher, MD   100 mg at 07/07/16 1004     Discharge Medications: Please see discharge summary for a list of discharge medications.  Relevant Imaging Results:  Relevant Lab Results:   Additional Information SSN [id number]  Benard Halsted, LCSWA

## 2016-07-07 NOTE — Discharge Summary (Signed)
[facility]  Physician Discharge Summary   Patient ID: [name] [name] MRN: [id number] DOB/AGE: [birth year] 70 y.o.  Admit date: 07/05/2016 Discharge date: 07/07/2016  PCP: Mauricio Po, [name]  DISCHARGE DIAGNOSES:  Principal Problem:   Syncope Active Problems:   CKD (chronic kidney disease) stage 3, GFR 30-59 ml/min   Uncontrolled type 2 diabetes mellitus with diabetic nephropathy, with long-term current use of insulin ([name])   Benign essential HTN   Acute on chronic renal failure ([name])   Postoperative anemia due to acute blood loss   Retroperitoneal hematoma   Vitamin B 12 deficiency   Syncope and collapse   Left leg DVT ([name])   RECOMMENDATIONS FOR OUTPATIENT FOLLOW UP: 1. Patient will need to remain on anticoagulation for 3 months for acute DVT. 2. After anticoagulation has been discontinued, patient will need to be restarted on aspirin and Plavix. 3. CBC and basic metabolic panel in 1 week.   DISCHARGE CONDITION: fair  Diet recommendation: Modified carbohydrate  Filed Weights   07/05/16 2036 07/06/16 0137 07/07/16 0539  Weight: 68.5 kg (151 lb) 62.7 kg (138 lb 4.8 oz) 63.7 kg (140 lb 6.9 oz)    INITIAL HISTORY: 70 year old gentleman with a past medical history of hypertension, diabetes, prior stroke, underwent of surgery for left femur fracture. The postoperative course was complicated by retroperitoneal hemorrhage and acute blood loss anemia requiring 2 units of blood. He was sent to skilled nursing facility. His aspirin and Plavix were resumed after a few days. He presented to the emergency department after having a syncopal episode at the skilled nursing facility while he was sitting on his wheelchair. He was hospitalized for further management.  Consultants: Phone discussion with orthopedics  Procedures:  Lower extremity venous Doppler shows short segment DVT in the left posterior tibial vein  HOSPITAL COURSE:   Left leg DVT in the setting of recent  surgery Patient was found to have larger left lower extremity compared to right. Venous Doppler was done which revealed a DVT in the right posterior tibial vein. I did discuss the situation with orthopedics Dr. Gladstone Lighter, who is covering for [facility] orthopedics. This conversation was because of his recent, the surgery and postoperative bleeding and hematoma and the need for anticoagulation. He states it would be okay for this patient to receive anticoagulation at this time. Patient was initially started on IV heparin. He remained stable. He was transitioned to Xarelto. He will need to take for 3 months. VQ scan was negative for PE. Renal function is normal today.   Syncope This occurred while he was sitting on the wheelchair. He does not have any neurological deficits. His blood work did not show anything that would cause his syncope while he was sitting. VQ scan was negative for PE. Telemetry does not show any arrhythmias. Echocardiogram was done. Normal systolic function was noted. No significant valvular abnormalities were seen.   Mild acute on chronic kidney disease stage III. Patient was found to have mild acute renal failure. He was given IV fluids. Renal function is normal today. His baseline is really unknown. He has had elevated creatinine in the past. Avoid nephrotoxic agents.  Mildly elevated troponin. Significance is unclear. EKG does not show any ischemic changes. Troponins are very low level. Do not anticipate any further testing at this time. He is chest pain-free. Echocardiogram shows normal systolic function. Does not need any further workup. Could be an element of demand ischemia due to syncope.  History of essential hypertension. Blood pressure medications were  held because he was hypotensive in the emergency department. Blood pressures have improved and now in hypertensive range. Okay to resume his home medications.  Recent mild retroperitoneal hemorrhage and iliopsoas  hematoma. CT scan that this morning shows resolution of these findings and hematoma's. Hemoglobin is low but stable.  Normocytic Anemia Recent acute blood loss anemia as mentioned above. Hemoglobin is stable. Repeat CBC in one week at the skilled nursing facility.  Diabetes mellitus type 2. CBG noted to be high this afternoon after he had a can of ensure. He'll be given higher dose of NovoLog. CBG will be repeated. He may resume his usual outpatient regimen.  History of vitamin B-12 deficiency. Continue with monthly injections as before.  History of stroke. He is on aspirin, Plavix and statin for same. Unclear when he was started on Plavix. He does not have any history of coronary artery disease. No history of stent placement. His stroke was in June 2016 and he was actually discharged on aspirin at that time. He will need to be sent out on oral anticoagulation for his DVT as discussed above. Will need to stop his antiplatelet agents. Aspirin and Plavix to be resumed with anticoagulation treatment duration has been completed.  Overall stable. Should be able to return to the skilled nursing facility later today. He was seen by PT and they recommended that he go back to skilled nursing level of care.    PERTINENT LABS:  The results of significant diagnostics from this hospitalization (including imaging, microbiology, ancillary and laboratory) are listed below for reference.    Microbiology: Recent Results (from the past 240 hour(s))  MRSA PCR Screening     Status: Abnormal   Collection Time: 07/06/16  2:27 AM  Result Value Ref Range Status   MRSA by PCR INVALID RESULTS, SPECIMEN SENT FOR CULTURE (A) NEGATIVE Final    Comment: RESULT CALLED TO, READ BACK BY AND VERIFIED WITH: T GLENN,RN AT 0756 07/06/16 BY L BENFIELD        The GeneXpert MRSA Assay (FDA approved for NASAL specimens only), is one component of a comprehensive MRSA colonization surveillance program. It is  not intended to diagnose MRSA infection nor to guide or monitor treatment for MRSA infections.   MRSA culture     Status: None (Preliminary result)   Collection Time: 07/06/16  2:27 AM  Result Value Ref Range Status   Specimen Description NASAL SWAB  Final   Special Requests NONE  Final   Culture NO MRSA DETECTED  Final   Report Status PENDING  Incomplete     Labs: Basic Metabolic Panel:  Recent Labs Lab 07/05/16 2120 07/06/16 0706 07/07/16 0812  NA 137 137 139  K 4.2 4.6 4.3  CL 104 107 106  CO2 _0 GLUCOSE 174* 224* 87  BUN 23* 27* 16  CREATININE 1.52* 1.55* 1.05  CALCIUM 9.1 8.4* 9.1   Liver Function Tests:  Recent Labs Lab 07/05/16 2120  AST 20  ALT 11*  ALKPHOS 157*  BILITOT 0.6  PROT 7.1  ALBUMIN 3.0*   CBC:  Recent Labs Lab 07/05/16 2120 07/06/16 0706 07/07/16 0721  WBC 7.2 7.4 6.9  NEUTROABS 4.5  --   --   HGB 10.1* 9.0* 10.5*  HCT 32.2* 29.1* 33.6*  MCV 93.9 95.7 94.1  PLT 394 355 338   Cardiac Enzymes:  Recent Labs Lab 07/05/16 2120 07/06/16 0148 07/06/16 0706 07/06/16 1357  TROPONINI 0.03* 0.05* 0.03* <0.03    CBG:  Recent Labs Lab 07/06/16 1327 07/06/16 1628 07/06/16 2115 07/07/16 0900 07/07/16 1209  GLUCAP 150* 232* 204* 107* 440*     IMAGING STUDIES Ct Abdomen Pelvis Wo Contrast  Result Date: 07/06/2016 CLINICAL DATA:  Patient is syncopal after being placed back on aspirin and Plavix. History of intramedullary nail placement 06/16/2016 and retroperitoneal hematoma diagnosed on 06/17/2016 CT. Reassess retroperitoneal hemorrhage. Go EXAM: CT ABDOMEN AND PELVIS WITHOUT CONTRAST TECHNIQUE: Multidetector CT imaging of the abdomen and pelvis was performed following the standard protocol without IV contrast. COMPARISON:  06/17/2016 CT O FINDINGS: Lower chest: Bibasilar dependent atelectasis. Partial clearing of a small focus of right lower lobe atelectasis/consolidation. No pneumothorax, effusion or pneumonic  consolidations. Minimal pleural thickening along the right major fissure. Hepatobiliary: No focal liver abnormality is seen. No gallstones, gallbladder wall thickening, or biliary dilatation. Pancreas: Unremarkable. No pancreatic ductal dilatation or surrounding inflammatory changes. Spleen: Trace linear calcifications along a splenic cleft. No splenomegaly. No subcapsular fluid. Adrenals/Urinary Tract: Stable left 14 mm interpolar and 12 mm lower pole renal cysts. No genitourinary calculi nor obstructive uropathy. Normal appearance of the bladder. The Stomach/Bowel: Normal bowel rotation. No bowel obstruction or acute inflammation. Moderate amount of fecal residue within large bowel. Normal appendix. Vascular/Lymphatic: Stable scattered atherosclerotic calcifications noted along the course the normal calibered aorta and common iliacs. No lymphadenopathy. Reproductive: Prostate obscured by streak artifacts from the patient's right hip arthroplasty and left dynamic compression screw fixation hardware. Other: No evidence of retroperitoneal hemorrhage. Musculoskeletal: Resolution of left iliopsoas and left piriformis intramuscular hematomas. Small fat containing umbilical hernia. Vacuum disc phenomena at L4-5 and L5-S1 with disc space narrowing. Broad-based disc-osteophyte complexes are seen at these levels. Moderate canal stenosis at L5-S1. Marked L4-5 and L5-S1 neural foraminal narrowing. No acute osseous abnormality. IMPRESSION: No recurrent retroperitoneal hemorrhage. Resolution of left-sided iliopsoas and piriformis hematomas. Electronically Signed   By: Ashley Royalty M.D.   On: 07/06/2016 11:03   Dg Chest 2 View  Result Date: 07/05/2016 CLINICAL DATA:  Shortness of breath. Patient was found outside unresponsive. No injuries. No fall. Slurred speech. EXAM: CHEST  2 VIEW COMPARISON:  06/15/2016 FINDINGS: Normal heart size and pulmonary vascularity. Lungs appear clear and expanded. No focal airspace disease or  consolidation. No blunting of costophrenic angles. No pneumothorax. Mediastinal contours appear intact. Tortuous aorta. Anterior wedge deformities of mid thoracic vertebra, probably represent T6 and T7. One of the levels has progressed since previous chest CT from 11/02/2015. IMPRESSION: No evidence of active pulmonary disease. Mid thoracic vertebral compression deformity with progression since 11/02/2015. Electronically Signed   By: Lucienne Capers M.D.   On: 07/05/2016 22:20    Nm Pulmonary Perf And Vent  Result Date: 07/06/2016 CLINICAL DATA:  Syncope, dyspnea, recent surgery for left leg fracture EXAM: NUCLEAR MEDICINE VENTILATION - PERFUSION LUNG SCAN TECHNIQUE: Ventilation images were obtained in multiple projections using inhaled aerosol Tc-1mDTPA. Perfusion images were obtained in multiple projections after intravenous injection of Tc-956mAA. RADIOPHARMACEUTICALS:  32.0 mCi Technetium-9954mPA aerosol inhalation and 4.4 mCi Technetium-1m79m IV COMPARISON:  None. FINDINGS: Ventilation: No focal ventilation defect. Mild clumping the central airways. Perfusion: No wedge shaped peripheral perfusion defects to suggest acute pulmonary embolism. Corresponding chest radiographs dated 07/05/2016 are clear. IMPRESSION: No evidence of pulmonary embolism. Electronically Signed   By: SriyJulian Hy.   On: 07/06/2016 14:03     DISCHARGE EXAMINATION: Vitals:   07/06/16 2116 07/07/16 0539 07/07/16 1006 07/07/16 1112  BP: (!) 160/76 130/62 (!) 144/65 (!)Marland Kitchen  150/77  Pulse: 64 (!) 58  78  Resp: _0 Temp: 98.7 F (37.1 C) 98.5 F (36.9 C)  98.6 F (37 C)  TempSrc: Oral Oral  Oral  SpO2: 99% 96%  99%  Weight:  63.7 kg (140 lb 6.9 oz)    Height:       General appearance: alert, cooperative, appears stated age and no distress Resp: clear to auscultation bilaterally Cardio: regular rate and rhythm, S1, S2 normal, no murmur, click, rub or gallop GI: soft, non-tender; bowel sounds normal;  no masses,  no organomegaly Extremities: Left lower extremity was examined. Slightly more lethargic compared to the right. But no erythema. No bruising. No discharge. Surgical sites are dry.  DISPOSITION: SNF  Discharge Instructions    Call MD for:  difficulty breathing, headache or visual disturbances    Complete by:  As directed    Call MD for:  extreme fatigue    Complete by:  As directed    Call MD for:  persistant dizziness or light-headedness    Complete by:  As directed    Call MD for:  persistant nausea and vomiting    Complete by:  As directed    Call MD for:  severe uncontrolled pain    Complete by:  As directed    Call MD for:  temperature >100.4    Complete by:  As directed    Diet Carb Modified    Complete by:  As directed    Discharge instructions    Complete by:  As directed    Monitor his CBGs as before.   You were cared for by a hospitalist during your hospital stay. If you have any questions about your discharge medications or the care you received while you were in the hospital after you are discharged, you can call the unit and asked to speak with the hospitalist on call if the hospitalist that took care of you is not available. Once you are discharged, your primary care physician will handle any further medical issues. Please note that NO REFILLS for any discharge medications will be authorized once you are discharged, as it is imperative that you return to your primary care physician (or establish a relationship with a primary care physician if you do not have one) for your aftercare needs so that they can reassess your need for medications and monitor your lab values. If you do not have a primary care physician, you can call [phone] for a physician referral.   Increase activity slowly    Complete by:  As directed       ALLERGIES: No Known Allergies   Current Discharge Medication List    START taking these medications   Details  !! Rivaroxaban (XARELTO) 15  MG TABS tablet Take 58m twice daily for 21 days (till Oct 15) and then 235monce daily. See other prescription. Qty: 42 tablet    !! rivaroxaban (XARELTO) 20 MG TABS tablet Take 1 tablet (20 mg total) by mouth daily with supper. STARTING October 16. Qty: 30 tablet     !! - Potential duplicate medications found. Please discuss with provider.    CONTINUE these medications which have CHANGED   Details  lisinopril (PRINIVIL,ZESTRIL) 10 MG tablet Take 1 tablet (10 mg total) by mouth daily.    oxyCODONE (OXY IR/ROXICODONE) 5 MG immediate release tablet Take 1 tablet (5 mg total) by mouth every 4 (four) hours as needed for moderate pain. Qty: 15 tablet, Refills:  0      CONTINUE these medications which have NOT CHANGED   Details  acetaminophen (TYLENOL) 325 MG tablet Take 2 tablets (650 mg total) by mouth every 6 (six) hours as needed for mild pain (or Fever >/= 101). Qty: 30 tablet, Refills: 0    amLODipine (NORVASC) 10 MG tablet Take 1 tablet (10 mg total) by mouth daily. Qty: 30 tablet, Refills: 0    atorvastatin (LIPITOR) 10 MG tablet Take 1 tablet (10 mg total) by mouth daily at 6 PM. Qty: 30 tablet, Refills: 0    blood glucose meter kit and supplies Use daily to check blood sugar. DX: E11.09 Qty: 1 each, Refills: 0    carvedilol (COREG) 6.25 MG tablet Take 1 tablet (6.25 mg total) by mouth 2 (two) times daily with a meal. Qty: 60 tablet, Refills: 0    cyanocobalamin (,VITAMIN B-12,) 1000 MCG/ML injection Inject 1 mL (1,000 mcg total) into the muscle every 30 (thirty) days. Qty: 1 mL, Refills: 0    feeding supplement, GLUCERNA SHAKE, (GLUCERNA SHAKE) LIQD Take 237 mLs by mouth 2 (two) times daily between meals. Qty: 237 mL, Refills: 0    glucose blood (COOL BLOOD GLUCOSE TEST STRIPS) test strip Use as instructed to check blood sugar up to 3 times a day. DX: E11.09 Qty: 300 each, Refills: 1    !! insulin aspart (NOVOLOG FLEXPEN) 100 UNIT/ML FlexPen Inject 0-12 Units into the  skin 3 (three) times daily with meals. Per sliding scale, 0-150 = 0 units, 151-200 = 2 units, 201-250 = 4 units, 251-300 = 6 units, 301-350 = 8 units, 351-400 = 10 units, 401-500 = 12 units (call MD)    insulin glargine (LANTUS) 100 UNIT/ML injection Inject 0.08 mLs (8 Units total) into the skin daily. Qty: 10 mL, Refills: 11    Lancets MISC Use as instructed to check blood sugar up to 3 times a day. DX: E11.09 Qty: 300 each, Refills: 1    !! NOVOLOG FLEXPEN 100 UNIT/ML FlexPen Inject 5 Units into the skin 3 (three) times daily before meals.     polyethylene glycol powder (GLYCOLAX/MIRALAX) powder Take 17 g by mouth every morning.     senna (SENOKOT) 8.6 MG TABS tablet Take 1 tablet (8.6 mg total) by mouth 2 (two) times daily. Qty: 120 each, Refills: 0    thiamine 100 MG tablet Take 1 tablet (100 mg total) by mouth daily. Qty: 30 tablet, Refills: 0     !! - Potential duplicate medications found. Please discuss with provider.    STOP taking these medications     aspirin EC 81 MG tablet      clopidogrel (PLAVIX) 75 MG tablet          TOTAL DISCHARGE TIME: 35 mins  [facility] Pager [phone]  07/07/2016, 1:07 PM

## 2016-07-08 LAB — MRSA CULTURE: Culture: DETECTED

## 2016-07-22 ENCOUNTER — Ambulatory Visit: Payer: Medicare HMO | Admitting: Family

## 2016-07-28 ENCOUNTER — Telehealth: Payer: Self-pay | Admitting: Emergency Medicine

## 2016-07-28 NOTE — Telephone Encounter (Signed)
Radovan with [facility] called and needs verbal orders for OT.2 times a wk for 4 wks for ADL, UE training and home safety training.

## 2016-07-28 NOTE — Telephone Encounter (Signed)
LVM giving verbal ok per Marya Amsler.

## 2016-07-28 NOTE — Telephone Encounter (Signed)
[facility] called and wants to know if they can get verbal orders for skilled nursing visit 2 times a wk for 3 wks and 1 time a wk for 3 wks. Thanks.

## 2016-07-29 ENCOUNTER — Telehealth: Payer: Self-pay | Admitting: Family

## 2016-07-29 NOTE — Telephone Encounter (Signed)
Gave verbal ok per Greg. 

## 2016-07-29 NOTE — Telephone Encounter (Signed)
Requesting verbal order for skilled nursing to come out 2 times a week for 3 weeks and then 1 time a week for 3 weeks for med management and speaking.  Home health aid for 2 times a week for 3 weeks.

## 2016-08-05 ENCOUNTER — Telehealth: Payer: Self-pay

## 2016-08-05 MED ORDER — OXYCODONE HCL 5 MG PO TABS
5.0000 mg | ORAL_TABLET | ORAL | 0 refills | Status: DC | PRN
Start: 2016-08-05 — End: 2016-08-11

## 2016-08-05 NOTE — Telephone Encounter (Signed)
[facility] nurse called and stated that pt was in 8/10 pain and needs pain medication. Assuming it is for a recent surgery he had on his femur that he fractured. Has not been seen since august. Please advise.

## 2016-08-05 NOTE — Telephone Encounter (Signed)
Called pt to let him know it is ready for pick up.

## 2016-08-05 NOTE — Telephone Encounter (Signed)
I will refill his oxycodone.

## 2016-08-06 ENCOUNTER — Telehealth: Payer: Self-pay | Admitting: Family

## 2016-08-06 NOTE — Telephone Encounter (Signed)
Error

## 2016-08-07 ENCOUNTER — Ambulatory Visit: Payer: Medicare HMO | Admitting: Family

## 2016-08-08 ENCOUNTER — Telehealth: Payer: Self-pay

## 2016-08-08 ENCOUNTER — Telehealth: Payer: Self-pay | Admitting: Family

## 2016-08-08 NOTE — Telephone Encounter (Signed)
[facility] with [facility] called and stated pt cancelled appt for today. Message sent as an FYI.

## 2016-08-08 NOTE — Telephone Encounter (Signed)
Jalene Mullet, PT from [facility], called to report that she saw [name] [name] today for therapy, she stating that he just got oxycodone 15 pill yesterday and she notice an empty bottle today and report pain 9/10. Pt was able to follow therapy instructions with no complication. She just wants to report this and he is canceling their service for all home health. FYI

## 2016-08-10 NOTE — Telephone Encounter (Signed)
Noted. Patient has had multiple issues with compliance and will discuss with him at office visit on 10/30. If he remains non-compliant, I am going to recommend a new provider as I do not feel that I am helping him.

## 2016-08-11 ENCOUNTER — Ambulatory Visit (INDEPENDENT_AMBULATORY_CARE_PROVIDER_SITE_OTHER): Payer: Medicare HMO | Admitting: Family

## 2016-08-11 ENCOUNTER — Ambulatory Visit: Payer: Medicare HMO | Admitting: Family

## 2016-08-11 ENCOUNTER — Encounter: Payer: Self-pay | Admitting: Family

## 2016-08-11 VITALS — BP 140/88 | HR 69 | Temp 98.3°F | Resp 16 | Ht 69.0 in | Wt 144.0 lb

## 2016-08-11 DIAGNOSIS — S72142D Displaced intertrochanteric fracture of left femur, subsequent encounter for closed fracture with routine healing: Secondary | ICD-10-CM

## 2016-08-11 DIAGNOSIS — Z794 Long term (current) use of insulin: Secondary | ICD-10-CM

## 2016-08-11 DIAGNOSIS — E1169 Type 2 diabetes mellitus with other specified complication: Secondary | ICD-10-CM

## 2016-08-11 DIAGNOSIS — I82442 Acute embolism and thrombosis of left tibial vein: Secondary | ICD-10-CM

## 2016-08-11 DIAGNOSIS — E1165 Type 2 diabetes mellitus with hyperglycemia: Secondary | ICD-10-CM | POA: Diagnosis not present

## 2016-08-11 DIAGNOSIS — IMO0002 Reserved for concepts with insufficient information to code with codable children: Secondary | ICD-10-CM

## 2016-08-11 DIAGNOSIS — Z23 Encounter for immunization: Secondary | ICD-10-CM

## 2016-08-11 DIAGNOSIS — E1121 Type 2 diabetes mellitus with diabetic nephropathy: Secondary | ICD-10-CM

## 2016-08-11 DIAGNOSIS — E785 Hyperlipidemia, unspecified: Secondary | ICD-10-CM

## 2016-08-11 MED ORDER — METHYLPREDNISOLONE ACETATE 80 MG/ML IJ SUSP
80.0000 mg | Freq: Once | INTRAMUSCULAR | Status: AC
  Administered 2016-08-11: 80 mg via INTRAMUSCULAR

## 2016-08-11 MED ORDER — KETOROLAC TROMETHAMINE 60 MG/2ML IM SOLN
60.0000 mg | Freq: Once | INTRAMUSCULAR | Status: AC
  Administered 2016-08-11: 60 mg via INTRAMUSCULAR

## 2016-08-11 NOTE — Progress Notes (Signed)
Subjective:    Patient ID: [name] [name], male    DOB: 24-May-1946, 70 y.o.   MRN: [id number]  Chief Complaint  Patient presents with  . Leg Pain    broke leg and is in a lot of pain, wants something to help having rib pain too on left side    HPI:  [name] [name] is a 70 y.o. male who  has a past medical history of Cerebral infarction due to thrombosis of right posterior cerebral artery ([place]) (06/08/2015); Closed comminuted intertrochanteric fracture of left femur ([name]); Diabetes mellitus without complication ([place]); Diabetic hyperosmolar non-ketotic state ([place]) (05/15/2016); DKA (diabetic ketoacidoses) ([place]) (05/09/2016); Hypertension; Postoperative anemia due to acute blood loss (06/18/2016); Retroperitoneal hematoma (06/18/2016); and Vitamin B 12 deficiency (06/18/2016). and presents today for a follow up office visit.  This is a new problem. Associated symptom of pain located in his left thigh and ribs have been going on for approximately one month following a fall where he experienced a closed comminuted intertrochanteric fracture of the left femur and is status post intramedullary nail insertion. He was also noted to have a deep vein thrombosis secondary to most recent surgery. Currently anticoagulated with Xarelto. He continues to ambulate with a walker and expresses difficulty getting around. He reports compliance with Xarelto.Pain is described as sharp and the severity interferes with his abilities to complete his activities of daily living. He did have Twilight, however he believes that he can complete recovery on his own and discontinued his therapy. He was recently prescribed pain medication and was noted to have complete a 4 day supply as prescribed in less than 24 hours. Does have chest wall pain located on the left and denies palpitations or leg swelling.  No Known Allergies    Outpatient Medications Prior to Visit  Medication Sig Dispense Refill  . acetaminophen (TYLENOL)  325 MG tablet Take 2 tablets (650 mg total) by mouth every 6 (six) hours as needed for mild pain (or Fever >/= 101). 30 tablet 0  . amLODipine (NORVASC) 10 MG tablet Take 1 tablet (10 mg total) by mouth daily. 30 tablet 0  . atorvastatin (LIPITOR) 10 MG tablet Take 1 tablet (10 mg total) by mouth daily at 6 PM. 30 tablet 0  . blood glucose meter kit and supplies Use daily to check blood sugar. DX: E11.09 1 each 0  . carvedilol (COREG) 6.25 MG tablet Take 1 tablet (6.25 mg total) by mouth 2 (two) times daily with a meal. 60 tablet 0  . cyanocobalamin (,VITAMIN B-12,) 1000 MCG/ML injection Inject 1 mL (1,000 mcg total) into the muscle every 30 (thirty) days. (Patient taking differently: Inject 1,000 mcg into the muscle every Monday. ) 1 mL 0  . feeding supplement, GLUCERNA SHAKE, (GLUCERNA SHAKE) LIQD Take 237 mLs by mouth 2 (two) times daily between meals. 237 mL 0  . glucose blood (COOL BLOOD GLUCOSE TEST STRIPS) test strip Use as instructed to check blood sugar up to 3 times a day. DX: E11.09 300 each 1  . insulin aspart (NOVOLOG FLEXPEN) 100 UNIT/ML FlexPen Inject 0-12 Units into the skin 3 (three) times daily with meals. Per sliding scale, 0-150 = 0 units, 151-200 = 2 units, 201-250 = 4 units, 251-300 = 6 units, 301-350 = 8 units, 351-400 = 10 units, 401-500 = 12 units (call MD)    . insulin glargine (LANTUS) 100 UNIT/ML injection Inject 0.08 mLs (8 Units total) into the skin daily. (Patient taking differently: Inject 20  Units into the skin daily with supper. ) 10 mL 11  . Lancets MISC Use as instructed to check blood sugar up to 3 times a day. DX: E11.09 300 each 1  . lisinopril (PRINIVIL,ZESTRIL) 10 MG tablet Take 1 tablet (10 mg total) by mouth daily.    Marland Kitchen NOVOLOG FLEXPEN 100 UNIT/ML FlexPen Inject 5 Units into the skin 3 (three) times daily before meals.     . polyethylene glycol powder (GLYCOLAX/MIRALAX) powder Take 17 g by mouth every morning.     . Rivaroxaban (XARELTO) 15 MG TABS tablet Take  42m twice daily for 21 days (till Oct 15) and then 267monce daily. See other prescription. 42 tablet   . rivaroxaban (XARELTO) 20 MG TABS tablet Take 1 tablet (20 mg total) by mouth daily with supper. STARTING October 16. 30 tablet   . senna (SENOKOT) 8.6 MG TABS tablet Take 1 tablet (8.6 mg total) by mouth 2 (two) times daily. 120 each 0  . thiamine 100 MG tablet Take 1 tablet (100 mg total) by mouth daily. 30 tablet 0  . oxyCODONE (OXY IR/ROXICODONE) 5 MG immediate release tablet Take 1 tablet (5 mg total) by mouth every 4 (four) hours as needed for moderate pain. 15 tablet 0   No facility-administered medications prior to visit.       Past Surgical History:  Procedure Laterality Date  . HIP ARTHROPLASTY Right 02/05/2013   Procedure: ARTHROPLASTY BIPOLAR HIP;  Surgeon: RoTobi BastosMD;  Location: [facility];  Service: Orthopedics;  Laterality: Right;  . INTRAMEDULLARY (IM) NAIL INTERTROCHANTERIC Left 06/16/2016   Procedure: INTRAMEDULLARY (IM) NAIL INTERTROCHANTRIC;  Surgeon: BrRod CanMD;  [facility] Service: [facility];  Laterality: Left;      Past Medical History:  Diagnosis Date  . 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 Closed comminuted intertrochanteric fracture of left femur ([facility]  . Diabetes mellitus without complication ([facility]  . Diabetic hyperosmolar non-ketotic state ([facility]8/12/2015  . DKA (diabetic ketoacidoses) ([facility]/28/2017  . Hypertension   . Postoperative anemia due to acute blood loss 06/18/2016  . Retroperitoneal hematoma 06/18/2016  . Vitamin B 12 deficiency 06/18/2016    Review of Systems  Constitutional: Negative for chills and fever.  Respiratory: Negative for cough, chest tightness, shortness of breath and wheezing.   Cardiovascular: Positive for chest pain. Negative for palpitations and leg swelling.  Musculoskeletal:       Positive for left leg pain and rib pain.  Neurological: Positive  for weakness. Negative for facial asymmetry and numbness.  Hematological: Negative for adenopathy. Does not bruise/bleed easily.      Objective:    BP 140/88 (BP Location: Left Arm, Patient Position: Sitting, Cuff Size: Normal)   Pulse 69   Temp 98.3 F (36.8 C) (Oral)   Resp 16   Ht '5\' 9"'  (1.753 m)   Wt 144 lb (65.3 kg)   SpO2 93%   BMI 21.27 kg/m  Nursing note and vital signs reviewed.  Physical Exam  Constitutional: He is oriented to person, place, and time. He appears well-developed and well-nourished. No distress.  Seated with walker placed in front of him. Appears dressed appropriately. Does have slightly slur to speech and answers questions appropriately.  Cardiovascular: Normal rate, regular rhythm, normal heart sounds and intact distal pulses.   Pulmonary/Chest: Effort normal and breath sounds normal.  Musculoskeletal:  Left lower extremity - no obvious deformity, discoloration, with mild edema.  There is significant weakness with muscle strength 3+ and incomplete range of motion. Able to complete knee extension. Distal pulses and sensation are intact and appropriate.  Neurological: He is alert and oriented to person, place, and time.  Skin: Skin is warm and dry.  Psychiatric: He has a normal mood and affect. His behavior is normal. Judgment and thought content normal.       Assessment & Plan:   Problem List Items Addressed This Visit      Cardiovascular and Mediastinum   Left leg DVT ([place])    Left leg DVT anticoagulated with Xarelto. There is significant concern regarding patient compliance with medication regimen given previous history. Continue current dosage of Xarelto and continue to monitor.         Endocrine   Uncontrolled type 2 diabetes mellitus with diabetic nephropathy, with long-term current use of insulin ([name]) (Chronic)   Relevant Orders   POCT glucose (manual entry)   Ambulatory referral to Endocrinology   Dyslipidemia associated with type 2  diabetes mellitus ([place])    Patient with questionable compliance with medication regimen his blood sugar today in office was 318. He indicates he forgets take his insulin this morning. Emphasized importance of taking medication as prescribed to help control his blood sugars as he's had multiple hospitalizations in the past secondary to noncompliance. Refer to endocrinology for further assistance with treatment. Encouraged to monitor blood sugars at home. Follow-up and continue to monitor.        Musculoskeletal and Integument   Closed comminuted intertrochanteric fracture of left femur ([place]) - Primary    Close comminuted intratrochanteric fracture of the left femur appears to be healing status post intramedullary neuro placement with pain control remaining a complication. Does not appear to be in significant pain, although gait is altered with current walker. Patient has significant history for noncompliance with medication regimens and narcotic use/abuse. Previously maintained through pain management. Given most recent prescription used within 24 hours provider does not feel comfortable providing additional narcotics to this patient and recommends over-the-counter Tylenol as needed for discomfort as well as nonpharmacological therapies including ice and elevation. Patient self discontinued physical therapy with significant remaining weakness of the left lower extremity. Advised to follow-up with orthopedic surgery for further instructions. Referral to pain management place per patient request. In office injection of Toradol and Depo-Medrol provided. Continue to monitor.      Relevant Medications   ketorolac (TORADOL) injection 60 mg (Completed)   methylPREDNISolone acetate (DEPO-MEDROL) injection 80 mg (Completed)   Other Relevant Orders   Ambulatory referral to Pain Clinic    Other Visit Diagnoses    Encounter for immunization       Relevant Orders   Flu vaccine HIGH DOSE PF (Completed)   POCT  glucose (manual entry)   Ambulatory referral to Pain Clinic   Ambulatory referral to Endocrinology       I have discontinued Mr. Frith oxyCODONE. I am also having him maintain his atorvastatin, acetaminophen, feeding supplement (GLUCERNA SHAKE), amLODipine, Lancets, thiamine, blood glucose meter kit and supplies, glucose blood, carvedilol, insulin glargine, senna, cyanocobalamin, polyethylene glycol powder, NOVOLOG FLEXPEN, insulin aspart, lisinopril, Rivaroxaban, and rivaroxaban. We administered ketorolac and methylPREDNISolone acetate.   Follow-up: No Follow-up on file.  Mauricio Po, [name]

## 2016-08-11 NOTE — Assessment & Plan Note (Signed)
Patient with questionable compliance with medication regimen his blood sugar today in office was 318. He indicates he forgets take his insulin this morning. Emphasized importance of taking medication as prescribed to help control his blood sugars as he's had multiple hospitalizations in the past secondary to noncompliance. Refer to endocrinology for further assistance with treatment. Encouraged to monitor blood sugars at home. Follow-up and continue to monitor.

## 2016-08-11 NOTE — Assessment & Plan Note (Signed)
Left leg DVT anticoagulated with Xarelto. There is significant concern regarding patient compliance with medication regimen given previous history. Continue current dosage of Xarelto and continue to monitor.

## 2016-08-11 NOTE — Assessment & Plan Note (Signed)
Close comminuted intratrochanteric fracture of the left femur appears to be healing status post intramedullary neuro placement with pain control remaining a complication. Does not appear to be in significant pain, although gait is altered with current walker. Patient has significant history for noncompliance with medication regimens and narcotic use/abuse. Previously maintained through pain management. Given most recent prescription used within 24 hours provider does not feel comfortable providing additional narcotics to this patient and recommends over-the-counter Tylenol as needed for discomfort as well as nonpharmacological therapies including ice and elevation. Patient self discontinued physical therapy with significant remaining weakness of the left lower extremity. Advised to follow-up with orthopedic surgery for further instructions. Referral to pain management place per patient request. In office injection of Toradol and Depo-Medrol provided. Continue to monitor.

## 2016-08-11 NOTE — Patient Instructions (Addendum)
Thank you for choosing [facility].  SUMMARY AND INSTRUCTIONS:  Please follow up with Dr. Lyla Glassing for additional pain meds and need for physical therapy.   Tylenol as needed for pain.   A referral to pain management has been sent.   Ice x 20 minutes every 2 hours and after activity.   Medication:  Your prescription(s) have been submitted to your pharmacy or been printed and provided for you. Please take as directed and contact our office if you believe you are having problem(s) with the medication(s) or have any questions.  Follow up:  If your symptoms worsen or fail to improve, please contact our office for further instruction, or in case of emergency go directly to the emergency room at the closest medical facility.

## 2016-08-14 LAB — GLUCOSE, POCT (MANUAL RESULT ENTRY): POC Glucose: 318 mg/dl — AB (ref 70–99)

## 2016-08-17 ENCOUNTER — Emergency Department (HOSPITAL_COMMUNITY): Payer: Medicare HMO

## 2016-08-17 ENCOUNTER — Inpatient Hospital Stay (HOSPITAL_COMMUNITY)
Admission: EM | Admit: 2016-08-17 | Discharge: 2016-08-19 | DRG: 639 | Disposition: A | Discharge status: Home Health | Payer: Medicare HMO | Attending: Internal Medicine | Admitting: Internal Medicine

## 2016-08-17 ENCOUNTER — Encounter (HOSPITAL_COMMUNITY): Payer: Self-pay

## 2016-08-17 DIAGNOSIS — N179 Acute kidney failure, unspecified: Secondary | ICD-10-CM | POA: Diagnosis present

## 2016-08-17 DIAGNOSIS — Z96641 Presence of right artificial hip joint: Secondary | ICD-10-CM | POA: Diagnosis present

## 2016-08-17 DIAGNOSIS — Z79899 Other long term (current) drug therapy: Secondary | ICD-10-CM

## 2016-08-17 DIAGNOSIS — Z8673 Personal history of transient ischemic attack (TIA), and cerebral infarction without residual deficits: Secondary | ICD-10-CM

## 2016-08-17 DIAGNOSIS — Z9114 Patient's other noncompliance with medication regimen: Secondary | ICD-10-CM | POA: Diagnosis not present

## 2016-08-17 DIAGNOSIS — E11649 Type 2 diabetes mellitus with hypoglycemia without coma: Secondary | ICD-10-CM | POA: Diagnosis not present

## 2016-08-17 DIAGNOSIS — IMO0002 Reserved for concepts with insufficient information to code with codable children: Secondary | ICD-10-CM

## 2016-08-17 DIAGNOSIS — I129 Hypertensive chronic kidney disease with stage 1 through stage 4 chronic kidney disease, or unspecified chronic kidney disease: Secondary | ICD-10-CM | POA: Diagnosis present

## 2016-08-17 DIAGNOSIS — F329 Major depressive disorder, single episode, unspecified: Secondary | ICD-10-CM | POA: Diagnosis present

## 2016-08-17 DIAGNOSIS — E162 Hypoglycemia, unspecified: Secondary | ICD-10-CM | POA: Diagnosis present

## 2016-08-17 DIAGNOSIS — T796XXA Traumatic ischemia of muscle, initial encounter: Secondary | ICD-10-CM | POA: Diagnosis present

## 2016-08-17 DIAGNOSIS — E15 Nondiabetic hypoglycemic coma: Secondary | ICD-10-CM | POA: Diagnosis present

## 2016-08-17 DIAGNOSIS — Z91148 Patient's other noncompliance with medication regimen for other reason: Secondary | ICD-10-CM

## 2016-08-17 DIAGNOSIS — Z87891 Personal history of nicotine dependence: Secondary | ICD-10-CM

## 2016-08-17 DIAGNOSIS — M6282 Rhabdomyolysis: Secondary | ICD-10-CM | POA: Diagnosis present

## 2016-08-17 DIAGNOSIS — E1100 Type 2 diabetes mellitus with hyperosmolarity without nonketotic hyperglycemic-hyperosmolar coma (NKHHC): Secondary | ICD-10-CM | POA: Diagnosis present

## 2016-08-17 DIAGNOSIS — N183 Chronic kidney disease, stage 3 unspecified: Secondary | ICD-10-CM | POA: Diagnosis present

## 2016-08-17 DIAGNOSIS — W19XXXA Unspecified fall, initial encounter: Secondary | ICD-10-CM | POA: Diagnosis present

## 2016-08-17 DIAGNOSIS — N189 Chronic kidney disease, unspecified: Secondary | ICD-10-CM

## 2016-08-17 DIAGNOSIS — Z8249 Family history of ischemic heart disease and other diseases of the circulatory system: Secondary | ICD-10-CM | POA: Diagnosis not present

## 2016-08-17 DIAGNOSIS — Z86718 Personal history of other venous thrombosis and embolism: Secondary | ICD-10-CM | POA: Diagnosis not present

## 2016-08-17 DIAGNOSIS — Y92009 Unspecified place in unspecified non-institutional (private) residence as the place of occurrence of the external cause: Secondary | ICD-10-CM

## 2016-08-17 DIAGNOSIS — E1165 Type 2 diabetes mellitus with hyperglycemia: Secondary | ICD-10-CM

## 2016-08-17 DIAGNOSIS — Z7901 Long term (current) use of anticoagulants: Secondary | ICD-10-CM | POA: Diagnosis not present

## 2016-08-17 DIAGNOSIS — E1122 Type 2 diabetes mellitus with diabetic chronic kidney disease: Secondary | ICD-10-CM | POA: Diagnosis present

## 2016-08-17 DIAGNOSIS — E785 Hyperlipidemia, unspecified: Secondary | ICD-10-CM | POA: Diagnosis present

## 2016-08-17 DIAGNOSIS — S0512XA Contusion of eyeball and orbital tissues, left eye, initial encounter: Secondary | ICD-10-CM | POA: Diagnosis present

## 2016-08-17 DIAGNOSIS — Z794 Long term (current) use of insulin: Secondary | ICD-10-CM | POA: Diagnosis not present

## 2016-08-17 DIAGNOSIS — E1121 Type 2 diabetes mellitus with diabetic nephropathy: Secondary | ICD-10-CM

## 2016-08-17 LAB — CBC WITH DIFFERENTIAL/PLATELET
Basophils Absolute: 0 10*3/uL (ref 0.0–0.1)
Basophils Relative: 0 %
Eosinophils Absolute: 0 10*3/uL (ref 0.0–0.7)
Eosinophils Relative: 0 %
HCT: 34.7 % — ABNORMAL LOW (ref 39.0–52.0)
Hemoglobin: 11.4 g/dL — ABNORMAL LOW (ref 13.0–17.0)
Lymphocytes Relative: 6 %
Lymphs Abs: 0.9 10*3/uL (ref 0.7–4.0)
MCH: 30.6 pg (ref 26.0–34.0)
MCHC: 32.9 g/dL (ref 30.0–36.0)
MCV: 93.3 fL (ref 78.0–100.0)
Monocytes Absolute: 1.1 10*3/uL — ABNORMAL HIGH (ref 0.1–1.0)
Monocytes Relative: 7 %
Neutro Abs: 12.9 10*3/uL — ABNORMAL HIGH (ref 1.7–7.7)
Neutrophils Relative %: 87 %
Platelets: 244 10*3/uL (ref 150–400)
RBC: 3.72 MIL/uL — ABNORMAL LOW (ref 4.22–5.81)
RDW: 13.8 % (ref 11.5–15.5)
WBC: 14.9 10*3/uL — ABNORMAL HIGH (ref 4.0–10.5)

## 2016-08-17 LAB — CK: Total CK: 1412 U/L — ABNORMAL HIGH (ref 49–397)

## 2016-08-17 LAB — URINALYSIS, ROUTINE W REFLEX MICROSCOPIC
Bilirubin Urine: NEGATIVE
Glucose, UA: 250 mg/dL — AB
Ketones, ur: NEGATIVE mg/dL
Leukocytes, UA: NEGATIVE
Nitrite: NEGATIVE
Protein, ur: 100 mg/dL — AB
Specific Gravity, Urine: 1.011 (ref 1.005–1.030)
pH: 5 (ref 5.0–8.0)

## 2016-08-17 LAB — URINE MICROSCOPIC-ADD ON
Bacteria, UA: NONE SEEN
Squamous Epithelial / LPF: NONE SEEN
WBC, UA: NONE SEEN WBC/hpf (ref 0–5)

## 2016-08-17 LAB — COMPREHENSIVE METABOLIC PANEL
ALT: 22 U/L (ref 17–63)
AST: 49 U/L — ABNORMAL HIGH (ref 15–41)
Albumin: 3.5 g/dL (ref 3.5–5.0)
Alkaline Phosphatase: 173 U/L — ABNORMAL HIGH (ref 38–126)
Anion gap: 9 (ref 5–15)
BUN: 36 mg/dL — ABNORMAL HIGH (ref 6–20)
CO2: 19 mmol/L — ABNORMAL LOW (ref 22–32)
Calcium: 9 mg/dL (ref 8.9–10.3)
Chloride: 113 mmol/L — ABNORMAL HIGH (ref 101–111)
Creatinine, Ser: 2.01 mg/dL — ABNORMAL HIGH (ref 0.61–1.24)
GFR calc Af Amer: 37 mL/min — ABNORMAL LOW (ref >60)
GFR calc non Af Amer: 32 mL/min — ABNORMAL LOW (ref >60)
Glucose, Bld: 118 mg/dL — ABNORMAL HIGH (ref 65–99)
Potassium: 4.3 mmol/L (ref 3.5–5.1)
Sodium: 141 mmol/L (ref 135–145)
Total Bilirubin: 0.4 mg/dL (ref 0.3–1.2)
Total Protein: 7.3 g/dL (ref 6.5–8.1)

## 2016-08-17 LAB — CBG MONITORING, ED
Glucose-Capillary: 66 mg/dL (ref 65–99)
Glucose-Capillary: 106 mg/dL — ABNORMAL HIGH (ref 65–99)
Glucose-Capillary: 138 mg/dL — ABNORMAL HIGH (ref 65–99)
Glucose-Capillary: 117 mg/dL — ABNORMAL HIGH (ref 65–99)

## 2016-08-17 LAB — GLUCOSE, CAPILLARY
Glucose-Capillary: 291 mg/dL — ABNORMAL HIGH (ref 65–99)
Glucose-Capillary: 71 mg/dL (ref 65–99)

## 2016-08-17 LAB — POC OCCULT BLOOD, ED: Fecal Occult Bld: NEGATIVE

## 2016-08-17 MED ORDER — LIDOCAINE HCL (PF) 1 % IJ SOLN
INTRAMUSCULAR | Status: AC
  Filled 2016-08-17: qty 5

## 2016-08-17 MED ORDER — SODIUM CHLORIDE 0.9% FLUSH: 3.0000 mL | Freq: Two times a day (BID) | INTRAVENOUS | Status: DC

## 2016-08-17 MED ORDER — CARVEDILOL 6.25 MG PO TABS
6.2500 mg | ORAL_TABLET | Freq: Two times a day (BID) | ORAL | Status: DC
  Administered 2016-08-17 – 2016-08-19 (×4): 6.25 mg via ORAL
  Filled 2016-08-17 (×4): qty 1

## 2016-08-17 MED ORDER — RIVAROXABAN 20 MG PO TABS
20.0000 mg | ORAL_TABLET | Freq: Every day | ORAL | Status: DC
  Administered 2016-08-17 – 2016-08-18 (×2): 20 mg via ORAL
  Filled 2016-08-17 (×2): qty 1

## 2016-08-17 MED ORDER — SODIUM CHLORIDE 0.9 % IV SOLN
INTRAVENOUS | Status: AC
  Administered 2016-08-17: 15:00:00 via INTRAVENOUS

## 2016-08-17 MED ORDER — DEXTROSE 50 % IV SOLN: 25.0000 mL | Freq: Once | INTRAVENOUS | Status: DC

## 2016-08-17 MED ORDER — INSULIN ASPART 100 UNIT/ML ~~LOC~~ SOLN
0.0000 [IU] | Freq: Three times a day (TID) | SUBCUTANEOUS | Status: DC
  Administered 2016-08-17: 5 [IU] via SUBCUTANEOUS
  Administered 2016-08-18: 2 [IU] via SUBCUTANEOUS
  Administered 2016-08-18: 3 [IU] via SUBCUTANEOUS
  Administered 2016-08-19 (×2): 5 [IU] via SUBCUTANEOUS

## 2016-08-17 MED ORDER — AMLODIPINE BESYLATE 10 MG PO TABS
10.0000 mg | ORAL_TABLET | Freq: Every day | ORAL | Status: DC
  Administered 2016-08-17 – 2016-08-19 (×3): 10 mg via ORAL
  Filled 2016-08-17 (×3): qty 1

## 2016-08-17 MED ORDER — SODIUM CHLORIDE 0.9 % IV SOLN
Freq: Once | INTRAVENOUS | Status: AC
  Administered 2016-08-17: 13:00:00 via INTRAVENOUS

## 2016-08-17 MED ORDER — VITAMIN B-1 100 MG PO TABS
100.0000 mg | ORAL_TABLET | Freq: Every day | ORAL | Status: DC
  Administered 2016-08-17 – 2016-08-19 (×3): 100 mg via ORAL
  Filled 2016-08-17 (×3): qty 1

## 2016-08-17 MED ORDER — SODIUM CHLORIDE 0.9 % IV SOLN
INTRAVENOUS | Status: DC
  Administered 2016-08-17 – 2016-08-18 (×3): via INTRAVENOUS

## 2016-08-17 MED ORDER — DEXTROSE 50 % IV SOLN
25.0000 mL | Freq: Once | INTRAVENOUS | Status: AC
  Administered 2016-08-17: 25 mL via INTRAVENOUS
  Filled 2016-08-17: qty 50

## 2016-08-17 MED ORDER — HYDRALAZINE HCL 20 MG/ML IJ SOLN: 5.0000 mg | Freq: Four times a day (QID) | INTRAMUSCULAR | Status: DC | PRN

## 2016-08-17 MED ORDER — ACETAMINOPHEN 325 MG PO TABS
650.0000 mg | ORAL_TABLET | Freq: Four times a day (QID) | ORAL | Status: DC | PRN
  Administered 2016-08-17 – 2016-08-18 (×2): 650 mg via ORAL
  Filled 2016-08-17 (×2): qty 2

## 2016-08-17 MED ORDER — ATORVASTATIN CALCIUM 10 MG PO TABS
10.0000 mg | ORAL_TABLET | Freq: Every day | ORAL | Status: DC
  Administered 2016-08-17 – 2016-08-18 (×2): 10 mg via ORAL
  Filled 2016-08-17 (×2): qty 1

## 2016-08-17 NOTE — ED Notes (Signed)
CBG: [id number] RN notified

## 2016-08-17 NOTE — ED Notes (Signed)
Attempted report x1. 

## 2016-08-17 NOTE — ED Notes (Signed)
CBG: 117 RN notified

## 2016-08-17 NOTE — ED Notes (Signed)
CBG 138

## 2016-08-17 NOTE — H&P (Signed)
[facility] History and Physical  Mikolaj Woolstenhulme MOQ:[id number] DOB: 10/25/1945 DOA: 08/17/2016  Referring physician: ED  PCP: Mauricio Po, [place]  Lanna Poche)  Chief Complaint: found unresponsive on floor  HPI: [name] [name] is a 70 y.o. male with significant past medical history of chronic kidney disease stage III, diabetes, found unresponsive on the floor this morning by his daughter.  Of note, she last saw him at 7:00 pm, his sugars at that time was in the 60s, but he said he would eat something and she left. However, this morning she found him down and called EMS.  Patient lives alone. By the time EMS arrived, his CBG was 37. They gave him IM glucagon because unable to get an IV access.  On arrival to ED, his CBG was 66.  The ER physician was able to get an external jugular peripheral IV and gave the patient one half amp of D5W.  Per daughter, patient's mentation is back to normal and he was found eating his lunch on my arrival.  Patient complains pain on his left eye, from where he feel and developed and ecchymosis.   Denies pain anywhere else. He is a poor historian, but states he thinks he fell down last night at 2:30am and was unable to get up.   He adamantly denies wish for rehab or living situation other than his home.  Pt's grown dgt is here at bedside.  Review of Systems:  Per hpi, o/w all systems reviewed and negative.  Past Medical History:  Diagnosis Date  . 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 Closed comminuted intertrochanteric fracture of left femur ([place])   . Diabetes mellitus without complication ([place])   . Diabetic hyperosmolar non-ketotic state ([place]) 05/15/2016  . DKA (diabetic ketoacidoses) ([place]) 05/09/2016  . Hypertension   . Postoperative anemia due to acute blood loss 06/18/2016  . Retroperitoneal hematoma 06/18/2016  . Vitamin B 12 deficiency 06/18/2016   Past Surgical History:  Procedure Laterality Date  . HIP  ARTHROPLASTY Right 02/05/2013   Procedure: ARTHROPLASTY BIPOLAR HIP;  Surgeon: [name], MD;  Location: [facility];  Service: Orthopedics;  Laterality: Right;  . INTRAMEDULLARY (IM) NAIL INTERTROCHANTERIC Left 06/16/2016   Procedure: INTRAMEDULLARY (IM) NAIL INTERTROCHANTRIC;  Surgeon: Rod Can, MD;  Location: [facility];  Service: Orthopedics;  Laterality: Left;   Social History:  reports that he quit smoking about 3 years ago. His smoking use included Cigarettes. He has a 7.50 pack-year smoking history. He has never used smokeless tobacco. He reports that he does not drink alcohol or use drugs.  No Known Allergies  Family History  Problem Relation Age of Onset  . Diabetes Mother   . Alzheimer's disease Mother   . Hypertension Mother   . Hyperlipidemia Father   . Hypertension Father   . Healthy Maternal Grandmother   . Pneumonia Maternal Grandfather      Prior to Admission medications   Medication Sig Start Date End Date Taking? Authorizing Provider  acetaminophen (TYLENOL) 325 MG tablet Take 2 tablets (650 mg total) by mouth every 6 (six) hours as needed for mild pain (or Fever >/= 101). 08/14/15  Yes Robbie Lis, MD  amLODipine (NORVASC) 10 MG tablet Take 1 tablet (10 mg total) by mouth daily. 08/14/15  Yes Robbie Lis, MD  atorvastatin (LIPITOR) 10 MG tablet Take 1 tablet (10 mg total) by mouth daily at 6 PM. 03/23/15  Yes [name], DO  blood glucose meter kit and supplies Use daily to check blood sugar. DX: E11.09 04/24/16  Yes Golden Circle, [name]  carvedilol (COREG) 6.25 MG tablet Take 1 tablet (6.25 mg total) by mouth 2 (two) times daily with a meal. 05/12/16  Yes Thurnell Lose, MD  cyanocobalamin (,VITAMIN B-12,) 1000 MCG/ML injection Inject 1 mL (1,000 mcg total) into the muscle every 30 (thirty) days. Patient taking differently: Inject 1,000 mcg into the muscle every Monday.  06/20/16  Yes Christina P Rama, MD  glucose blood (COOL BLOOD GLUCOSE TEST STRIPS) test strip  Use as instructed to check blood sugar up to 3 times a day. DX: E11.09 04/24/16  Yes Golden Circle, [name]  insulin aspart (NOVOLOG) 100 UNIT/ML FlexPen Inject 5 Units into the skin 3 (three) times daily with meals.   Yes Historical Provider, MD  insulin glargine (LANTUS) 100 UNIT/ML injection Inject 0.08 mLs (8 Units total) into the skin daily. Patient taking differently: Inject 10 Units into the skin daily with supper.  06/20/16  Yes Venetia Maxon Rama, MD  Lancets MISC Use as instructed to check blood sugar up to 3 times a day. DX: E11.09 10/16/15  Yes Golden Circle, [name]  lisinopril (PRINIVIL,ZESTRIL) 10 MG tablet Take 1 tablet (10 mg total) by mouth daily. 07/07/16  Yes Bonnielee Haff, MD  polyethylene glycol powder (GLYCOLAX/MIRALAX) powder Take 17 g by mouth every morning.  06/20/16  Yes Historical Provider, MD  rivaroxaban (XARELTO) 20 MG TABS tablet Take 1 tablet (20 mg total) by mouth daily with supper. STARTING October 16. 07/28/16  Yes Bonnielee Haff, MD  thiamine 100 MG tablet Take 1 tablet (100 mg total) by mouth daily. 11/03/15  Yes Simbiso Ranga, MD  feeding supplement, GLUCERNA SHAKE, (GLUCERNA SHAKE) LIQD Take 237 mLs by mouth 2 (two) times daily between meals. Patient not taking: Reported on 08/17/2016 08/14/15   Robbie Lis, MD  insulin aspart (NOVOLOG FLEXPEN) 100 UNIT/ML FlexPen Inject 0-12 Units into the skin 3 (three) times daily with meals. Per sliding scale, 0-150 = 0 units, 151-200 = 2 units, 201-250 = 4 units, 251-300 = 6 units, 301-350 = 8 units, 351-400 = 10 units, 401-500 = 12 units (call MD)    Historical Provider, MD  NOVOLOG FLEXPEN 100 UNIT/ML FlexPen Inject 5 Units into the skin 3 (three) times daily before meals.  06/20/16   Historical Provider, MD  senna (SENOKOT) 8.6 MG TABS tablet Take 1 tablet (8.6 mg total) by mouth 2 (two) times daily. Patient not taking: Reported on 08/17/2016 06/20/16   Venetia Maxon Rama, MD   Physical Exam: Vitals:   08/17/16 1300 08/17/16 1315  08/17/16 1330 08/17/16 1345  BP: 168/84 188/89 (!) 164/130 180/89  Pulse: 73 71 72 92  Resp: _0 Temp:      TempSrc:      SpO2: 100% 100% 100% 100%  Weight:      Height:        Wt Readings from Last 3 Encounters:  08/17/16 65.3 kg (144 lb)  08/11/16 65.3 kg (144 lb)  07/07/16 63.7 kg (140 lb 6.9 oz)    General:  Appears calm and comfortable, pleasant, NAD, AAOx3,  Eyes: PERRL, normal  irises & conjunctiva.  Large left periorbital and eyelid ecchymosis.  Eye is almost swollen shut on exam ENT: grossly normal hearing, lips & tongue, mmm.  Neck: no LAD, masses or thyromegaly Cardiovascular: RRR, no m/r/g. No LE edema. Telemetry: SR, no arrhythmias  Respiratory: CTA bilaterally, no w/r/r. Normal respiratory effort. Abdomen: soft, ntnd Skin: no rash or induration seen on limited exam Musculoskeletal: grossly normal tone BUE/BLE Psychiatric: grossly normal mood and affect, speech fluent and appropriate Neurologic: grossly non-focal.  Alert, eating his lunch w/o complaints on my arrival.          Labs on Admission:  Basic Metabolic Panel:  Recent Labs Lab 08/17/16 1034  NA 141  K 4.3  CL 113*  CO2 19*  GLUCOSE 118*  BUN 36*  CREATININE 2.01*  CALCIUM 9.0   Liver Function Tests:  Recent Labs Lab 08/17/16 1034  AST 49*  ALT 22  ALKPHOS 173*  BILITOT 0.4  PROT 7.3  ALBUMIN 3.5   No results for input(s): LIPASE, AMYLASE in the last 168 hours. No results for input(s): AMMONIA in the last 168 hours. CBC:  Recent Labs Lab 08/17/16 1034  WBC 14.9*  NEUTROABS 12.9*  HGB 11.4*  HCT 34.7*  MCV 93.3  PLT 244   Cardiac Enzymes:  Recent Labs Lab 08/17/16 1034  CKTOTAL 1,412*    BNP (last 3 results) No results for input(s): BNP in the last 8760 hours.  ProBNP (last 3 results) No results for input(s): PROBNP in the last 8760 hours.  CBG:  Recent Labs Lab 08/17/16 0959 08/17/16 1036 08/17/16 1129 08/17/16 1255  GLUCAP 66 106* 138* 117*      Radiological Exams on Admission: Ct Head Wo Contrast  Result Date: 08/17/2016 CLINICAL DATA:  Found unresponsive with left orbital hematoma EXAM: CT HEAD WITHOUT CONTRAST CT CERVICAL SPINE WITHOUT CONTRAST CT ORBITS WITHOUT CONTRAST TECHNIQUE: Multidetector CT imaging of the head, orbits and cervical spine was performed following the standard protocol without intravenous contrast. Multiplanar CT image reconstructions were also generated. COMPARISON:  None. FINDINGS: CT HEAD FINDINGS Brain: No evidence of acute infarction, hemorrhage, hydrocephalus, extra-axial collection or mass lesion/mass effect. Mild atrophic changes are noted. Vascular: No hyperdense vessel or unexpected calcification. Skull: Normal. Negative for fracture or focal lesion. Sinuses/Orbits: Mucosal thickening is noted within the left maxillary antrum. Other: Soft tissue swelling is noted over the left frontal region with a small hematoma measuring 2.6 cm in greatest dimension. CT CERVICAL SPINE FINDINGS Alignment: Normal. Skull base and vertebrae: 7 cervical segments are well visualized. Vertebral body height is well maintained. Facet hypertrophic changes are noted. No acute fracture is seen. Soft tissues and spinal canal: No prevertebral fluid or swelling. No visible canal hematoma. Disc levels: Disc space narrowing is noted at C3-4, C4-5, C5-6 and C6-7 with associated anterior and posterior osteophytes. This contributes to multilevel mild spinal canal stenosis and neural foraminal narrowing. Upper chest: Negative. Other: None CT ORBITS FINDINGS Orbits: The orbits and their contents are within normal limits. No definitive orbital wall fracture is noted. No other fractures are noted. Visualized sinuses: Mucosal thickening is noted within the left maxillary antrum. Soft tissues: Left frontal soft tissue hematoma is noted consistent with the recent injury. IMPRESSION: CT of the head:  No acute intracranial abnormality noted. Atrophic  changes. Left frontal soft tissue hematoma. CT orbits: No acute abnormality of the orbits. Left maxillary mucosal thickening. Left frontal soft tissue hematoma. CT of cervical spine: Multilevel degenerative change without acute abnormality. Electronically Signed   By: Inez Catalina M.D.   On: 08/17/2016 12:34   Ct Cervical Spine Wo Contrast  Result Date: 08/17/2016 CLINICAL DATA:  Found unresponsive with left orbital hematoma EXAM: CT HEAD WITHOUT CONTRAST CT CERVICAL SPINE WITHOUT CONTRAST CT ORBITS WITHOUT  CONTRAST TECHNIQUE: Multidetector CT imaging of the head, orbits and cervical spine was performed following the standard protocol without intravenous contrast. Multiplanar CT image reconstructions were also generated. COMPARISON:  None. FINDINGS: CT HEAD FINDINGS Brain: No evidence of acute infarction, hemorrhage, hydrocephalus, extra-axial collection or mass lesion/mass effect. Mild atrophic changes are noted. Vascular: No hyperdense vessel or unexpected calcification. Skull: Normal. Negative for fracture or focal lesion. Sinuses/Orbits: Mucosal thickening is noted within the left maxillary antrum. Other: Soft tissue swelling is noted over the left frontal region with a small hematoma measuring 2.6 cm in greatest dimension. CT CERVICAL SPINE FINDINGS Alignment: Normal. Skull base and vertebrae: 7 cervical segments are well visualized. Vertebral body height is well maintained. Facet hypertrophic changes are noted. No acute fracture is seen. Soft tissues and spinal canal: No prevertebral fluid or swelling. No visible canal hematoma. Disc levels: Disc space narrowing is noted at C3-4, C4-5, C5-6 and C6-7 with associated anterior and posterior osteophytes. This contributes to multilevel mild spinal canal stenosis and neural foraminal narrowing. Upper chest: Negative. Other: None CT ORBITS FINDINGS Orbits: The orbits and their contents are within normal limits. No definitive orbital wall fracture is noted. No  other fractures are noted. Visualized sinuses: Mucosal thickening is noted within the left maxillary antrum. Soft tissues: Left frontal soft tissue hematoma is noted consistent with the recent injury. IMPRESSION: CT of the head:  No acute intracranial abnormality noted. Atrophic changes. Left frontal soft tissue hematoma. CT orbits: No acute abnormality of the orbits. Left maxillary mucosal thickening. Left frontal soft tissue hematoma. CT of cervical spine: Multilevel degenerative change without acute abnormality. Electronically Signed   By: Inez Catalina M.D.   On: 08/17/2016 12:34   Ct Orbits Wo Contrast  Result Date: 08/17/2016 CLINICAL DATA:  Found unresponsive with left orbital hematoma EXAM: CT HEAD WITHOUT CONTRAST CT CERVICAL SPINE WITHOUT CONTRAST CT ORBITS WITHOUT CONTRAST TECHNIQUE: Multidetector CT imaging of the head, orbits and cervical spine was performed following the standard protocol without intravenous contrast. Multiplanar CT image reconstructions were also generated. COMPARISON:  None. FINDINGS: CT HEAD FINDINGS Brain: No evidence of acute infarction, hemorrhage, hydrocephalus, extra-axial collection or mass lesion/mass effect. Mild atrophic changes are noted. Vascular: No hyperdense vessel or unexpected calcification. Skull: Normal. Negative for fracture or focal lesion. Sinuses/Orbits: Mucosal thickening is noted within the left maxillary antrum. Other: Soft tissue swelling is noted over the left frontal region with a small hematoma measuring 2.6 cm in greatest dimension. CT CERVICAL SPINE FINDINGS Alignment: Normal. Skull base and vertebrae: 7 cervical segments are well visualized. Vertebral body height is well maintained. Facet hypertrophic changes are noted. No acute fracture is seen. Soft tissues and spinal canal: No prevertebral fluid or swelling. No visible canal hematoma. Disc levels: Disc space narrowing is noted at C3-4, C4-5, C5-6 and C6-7 with associated anterior and posterior  osteophytes. This contributes to multilevel mild spinal canal stenosis and neural foraminal narrowing. Upper chest: Negative. Other: None CT ORBITS FINDINGS Orbits: The orbits and their contents are within normal limits. No definitive orbital wall fracture is noted. No other fractures are noted. Visualized sinuses: Mucosal thickening is noted within the left maxillary antrum. Soft tissues: Left frontal soft tissue hematoma is noted consistent with the recent injury. IMPRESSION: CT of the head:  No acute intracranial abnormality noted. Atrophic changes. Left frontal soft tissue hematoma. CT orbits: No acute abnormality of the orbits. Left maxillary mucosal thickening. Left frontal soft tissue hematoma. CT of cervical spine: Multilevel degenerative change without  acute abnormality. Electronically Signed   By: Inez Catalina M.D.   On: 08/17/2016 12:34    EKG: ordered  EKG Interpretation  Date/Time:    Ventricular Rate:    PR Interval:    QRS Duration:   QT Interval:    QTC Calculation:   R Axis:     Text Interpretation:        Vasc US 07/06/16 Summary:  - Findings consistent with acute deep vein thrombosis involving the   left posterial tibial vein. Incidental findings are consistent   with: enlarged lymph node on the right and enlarged lymph node on   the left. - No evidence of deep vein thrombosis involving the right lower   extremity.  Other specific details can be found in the table(s) above. Prepared and Electronically Authenticated by  Gae Gallop MD  Assessment/Plan Principal Problem:   Hypoglycemia Active Problems:   CKD (chronic kidney disease) stage 3, GFR 30-59 ml/min   Uncontrolled type 2 diabetes mellitus with diabetic nephropathy, with long-term current use of insulin ([name])   Acute on chronic renal failure ([place])   Fall   Rhabdomyolysis   Noncompliance with medication regimen   On continuous oral anticoagulation   1. Hypoglycemia, found unresponsive at home  this morning - admit tele - mentation baseline - neurochecks, concern for medication noncompliance., - per dght he takes his meds sometimes.  2. Acute kidney injury with chronic kidney disease stage III - hydrate, received bolus iv in ed, will transition to NS at 1106m /hr, reeval in am w/ labs /ck - if no improvement, may need bicarb gtt. - renal c/s if no improvement  3. Rhabdomyolysis - from fall, follow ck  4. Left eye ecchymosis/contusion from fall - CT of the head Impression:  No acute intracranial abnormality noted. Atrophic changes. Left frontal soft tissue hematoma. CT orbits: No acute abnormality of the orbits. Left maxillary mucosal thickening. Left frontal soft tissue hematoma. CT of cervical spine: Multilevel degenerative change without acute abnormality. - tylenol prn for now, he denies acute c/o to me.  5. Uncontrolled type 2 diabetes mellitus with diabetic nephropathy, on long-term use of insulin, w/ hypoglycemia - holding lantus - ssi, sensitive regimen   6. Malignant hypertension. Initially on ed presentation - continue norvasc 10, coreg 6.25bid,  - hydralazine prn iv  7. He is on continuous oral anticoagulation for history of DVT (dx 07/06/16 on left post tibial vein) - concerning given fall risk, hypoglycemic events, - PT eval, - continue Xarelto for now,  - worrisome w/ his fall, if recurrent high risk falls, would be concerned about bleeding risk as well.  8. Fall Risk  9. The patient lives alone - SW c/s to assist w/ dispo.  10. Noncompliance with medication regimen   Code Status: Full DVT Prophylaxis:  Xarelto Family Communication:  Pt and dgt at bedside Disposition Plan: ?rehab vs hhc, pending PT eval and progress  Time spent: 456ms  DaMaren ReamerD., MBA/MHA [facility] Pager [phone]

## 2016-08-17 NOTE — ED Provider Notes (Addendum)
[facility] DEPT Provider Note   CSN: [id number] Arrival date & time: 08/17/16  8338     History   Chief Complaint Chief Complaint  Patient presents with  . Fall  . Hypoglycemia    HPI [name] [name] is a 70 y.o. male.Patient was found on the floor unresponsive this morning by his daughter. She called EMS. Patient's blood sugar was noted to be 37. Patient's daughter reports that she left him at 7 PM yesterday. Patient states he recalls falling but "I don't remember getting back up" his daughter reports that he does not check his blood sugars. As result fall. Patient struck his head. He denies pain anywhere.. EMS treated patient with glucagon 1 mg IM prior to coming here they treated him.  HPI  Past Medical History:  Diagnosis Date  . 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 Closed comminuted intertrochanteric fracture of left femur ([place])   . Diabetes mellitus without complication ([place])   . Diabetic hyperosmolar non-ketotic state ([place]) 05/15/2016  . DKA (diabetic ketoacidoses) ([place]) 05/09/2016  . Hypertension   . Postoperative anemia due to acute blood loss 06/18/2016  . Retroperitoneal hematoma 06/18/2016  . Vitamin B 12 deficiency 06/18/2016    Patient Active Problem List   Diagnosis Date Noted  . Syncope and collapse 07/06/2016  . Left leg DVT ([place]) 07/06/2016  . Syncope 07/05/2016  . Postoperative anemia due to acute blood loss 06/18/2016  . Retroperitoneal hematoma 06/18/2016  . Vitamin B 12 deficiency 06/18/2016  . Elective surgery   . Postop check   . Closed comminuted intertrochanteric fracture of left femur ([place])   . Renal insufficiency   . Hyponatremia 05/15/2016  . Anxiety 05/15/2016  . History of CVA (cerebrovascular accident) 05/15/2016  . Acute on chronic renal failure ([place]) 05/09/2016  . Hypoglycemia 11/02/2015  . Chronic prescription benzodiazepine use 11/02/2015  . Narcotic dependency, continuous ([place])  11/02/2015  . Uncontrolled type 2 diabetes mellitus with diabetic nephropathy, with long-term current use of insulin ([place]) 08/12/2015  . Benign essential HTN 08/12/2015  . Dyslipidemia associated with type 2 diabetes mellitus ([place]) 08/12/2015  . Depression 08/12/2015  . CKD (chronic kidney disease) stage 3, GFR 30-59 ml/min 03/20/2014    Past Surgical History:  Procedure Laterality Date  . HIP ARTHROPLASTY Right 02/05/2013   Procedure: ARTHROPLASTY BIPOLAR HIP;  Surgeon: [name], MD;  Location: [facility];  Service: Orthopedics;  Laterality: Right;  . INTRAMEDULLARY (IM) NAIL INTERTROCHANTERIC Left 06/16/2016   Procedure: INTRAMEDULLARY (IM) NAIL INTERTROCHANTRIC;  Surgeon: Rod Can, MD;  Location: [facility];  Service: Orthopedics;  Laterality: Left;       Home Medications    Prior to Admission medications   Medication Sig Start Date End Date Taking? Authorizing Provider  acetaminophen (TYLENOL) 325 MG tablet Take 2 tablets (650 mg total) by mouth every 6 (six) hours as needed for mild pain (or Fever >/= 101). 08/14/15   Robbie Lis, MD  amLODipine (NORVASC) 10 MG tablet Take 1 tablet (10 mg total) by mouth daily. 08/14/15   Robbie Lis, MD  atorvastatin (LIPITOR) 10 MG tablet Take 1 tablet (10 mg total) by mouth daily at 6 PM. 03/23/15   [name], DO  blood glucose meter kit and supplies Use daily to check blood sugar. DX: E11.09 04/24/16   Golden Circle, [name]  carvedilol (COREG) 6.25 MG tablet Take 1 tablet (6.25 mg total) by mouth 2 (two) times daily with  a meal. 05/12/16   Thurnell Lose, MD  cyanocobalamin (,VITAMIN B-12,) 1000 MCG/ML injection Inject 1 mL (1,000 mcg total) into the muscle every 30 (thirty) days. Patient taking differently: Inject 1,000 mcg into the muscle every Monday.  06/20/16   Christina P Rama, MD  feeding supplement, GLUCERNA SHAKE, (GLUCERNA SHAKE) LIQD Take 237 mLs by mouth 2 (two) times daily between meals. 08/14/15   Robbie Lis, MD  glucose  blood (COOL BLOOD GLUCOSE TEST STRIPS) test strip Use as instructed to check blood sugar up to 3 times a day. DX: E11.09 04/24/16   Golden Circle, [name]  insulin aspart (NOVOLOG FLEXPEN) 100 UNIT/ML FlexPen Inject 0-12 Units into the skin 3 (three) times daily with meals. Per sliding scale, 0-150 = 0 units, 151-200 = 2 units, 201-250 = 4 units, 251-300 = 6 units, 301-350 = 8 units, 351-400 = 10 units, 401-500 = 12 units (call MD)    Historical Provider, MD  insulin glargine (LANTUS) 100 UNIT/ML injection Inject 0.08 mLs (8 Units total) into the skin daily. Patient taking differently: Inject 20 Units into the skin daily with supper.  06/20/16   Venetia Maxon Rama, MD  Lancets MISC Use as instructed to check blood sugar up to 3 times a day. DX: E11.09 10/16/15   Golden Circle, [name]  lisinopril (PRINIVIL,ZESTRIL) 10 MG tablet Take 1 tablet (10 mg total) by mouth daily. 07/07/16   Bonnielee Haff, MD  NOVOLOG FLEXPEN 100 UNIT/ML FlexPen Inject 5 Units into the skin 3 (three) times daily before meals.  06/20/16   Historical Provider, MD  polyethylene glycol powder (GLYCOLAX/MIRALAX) powder Take 17 g by mouth every morning.  06/20/16   Historical Provider, MD  Rivaroxaban (XARELTO) 15 MG TABS tablet Take 17m twice daily for 21 days (till Oct 15) and then 284monce daily. See other prescription. 07/07/16   GoBonnielee HaffMD  rivaroxaban (XARELTO) 20 MG TABS tablet Take 1 tablet (20 mg total) by mouth daily with supper. STARTING October 16. 07/28/16   GoBonnielee HaffMD  senna (SENOKOT) 8.6 MG TABS tablet Take 1 tablet (8.6 mg total) by mouth 2 (two) times daily. 06/20/16   ChVenetia Maxonama, MD  thiamine 100 MG tablet Take 1 tablet (100 mg total) by mouth daily. 11/03/15   SiNat MathMD    Family History Family History  Problem Relation Age of Onset  . Diabetes Mother   . Alzheimer's disease Mother   . Hypertension Mother   . Hyperlipidemia Father   . Hypertension Father   . Healthy Maternal Grandmother   .  Pneumonia Maternal Grandfather     Social History Social History  Substance Use Topics  . Smoking status: Former Smoker    Packs/day: 0.25    Years: 30.00    Types: Cigarettes    Quit date: 01/31/2013  . Smokeless tobacco: Never Used  . Alcohol use No     Allergies   Patient has no known allergies.   Review of Systems Review of Systems  HENT: Negative.   Respiratory: Negative.   Cardiovascular: Negative.   Gastrointestinal: Negative.   Musculoskeletal: Negative.   Skin: Negative.   Allergic/Immunologic: Positive for immunocompromised state.  Neurological: Positive for weakness.  Hematological: Bruises/bleeds easily.  Psychiatric/Behavioral: Negative.   All other systems reviewed and are negative.    Physical Exam Updated Vital Signs BP 167/84   Pulse 79   Temp 97.3 F (36.3 C) (Oral)   Resp 11   Ht 5'  9" (1.753 m)   Wt 144 lb (65.3 kg)   SpO2 100%   BMI 21.27 kg/m   Physical Exam  Constitutional: No distress.  Chronically ill-appearing  HENT:  Left periorbital and eyelid ecchymosis. Left eye swollen shut and otherwise normal. Atraumatic  Eyes: Conjunctivae are normal. Pupils are equal, round, and reactive to light.  Neck: Neck supple. No tracheal deviation present. No thyromegaly present.  Cardiovascular: Normal rate and regular rhythm.   No murmur heard. Pulmonary/Chest: Effort normal and breath sounds normal.  Abdominal: Soft. Bowel sounds are normal. He exhibits no distension. There is no tenderness.  Genitourinary: Rectum normal and penis normal. Rectal exam shows guaiac negative stool.  Genitourinary Comments: Normal tone brown stool no gross blood Hemoccult negative  Musculoskeletal: Normal range of motion. He exhibits no edema or tenderness.  [employer] spine is nontender. Pelvis stable nontender. All 4 extremities without contusion abrasion or tenderness neurovascular intact.  Neurological: He is alert. Coordination normal.  Skin: Skin is warm and  dry. No rash noted.  Psychiatric: He has a normal mood and affect.  Nursing note and vitals reviewed.    ED Treatments / Results  Labs (all labs ordered are listed, but only abnormal results are displayed) Labs Reviewed  CBC WITH DIFFERENTIAL/PLATELET - Abnormal; Notable for the following:       Result Value   WBC 14.9 (*)    RBC 3.72 (*)    Hemoglobin 11.4 (*)    HCT 34.7 (*)    Neutro Abs 12.9 (*)    Monocytes Absolute 1.1 (*)    All other components within normal limits  CBG MONITORING, ED - Abnormal; Notable for the following:    Glucose-Capillary 106 (*)    All other components within normal limits  COMPREHENSIVE METABOLIC PANEL  URINALYSIS, ROUTINE W REFLEX MICROSCOPIC (NOT AT [facility])  CK  CBG MONITORING, ED  POC OCCULT BLOOD, ED    EKG  EKG Interpretation None       Radiology No results found.  Procedures Procedures (including critical care time)  Medications Ordered in ED Medications  lidocaine (PF) (XYLOCAINE) 1 % injection (not administered)  dextrose 50 % solution 25 mL (25 mLs Intravenous Given 08/17/16 1037)   Results for orders placed or performed during the hospital encounter of 08/17/16  Comprehensive metabolic panel  Result Value Ref Range   Sodium 141 135 - 145 mmol/L   Potassium 4.3 3.5 - 5.1 mmol/L   Chloride 113 (H) 101 - 111 mmol/L   CO2 19 (L) 22 - 32 mmol/L   Glucose, Bld 118 (H) 65 - 99 mg/dL   BUN 36 (H) 6 - 20 mg/dL   Creatinine, Ser 2.01 (H) 0.61 - 1.24 mg/dL   Calcium 9.0 8.9 - 10.3 mg/dL   Total Protein 7.3 6.5 - 8.1 g/dL   Albumin 3.5 3.5 - 5.0 g/dL   AST 49 (H) 15 - 41 U/L   ALT 22 17 - 63 U/L   Alkaline Phosphatase 173 (H) 38 - 126 U/L   Total Bilirubin 0.4 0.3 - 1.2 mg/dL   GFR calc non Af Amer 32 (L) >60 mL/min   GFR calc Af Amer 37 (L) >60 mL/min   Anion gap 9 5 - 15  CBC with Differential/Platelet  Result Value Ref Range   WBC 14.9 (H) 4.0 - 10.5 K/uL   RBC 3.72 (L) 4.22 - 5.81 MIL/uL   Hemoglobin 11.4 (L) 13.0 -  17.0 g/dL   HCT 34.7 (L) 39.0 -  52.0 %   MCV 93.3 78.0 - 100.0 fL   MCH 30.6 26.0 - 34.0 pg   MCHC 32.9 30.0 - 36.0 g/dL   RDW 13.8 11.5 - 15.5 %   Platelets 244 150 - 400 K/uL   Neutrophils Relative % 87 %   Neutro Abs 12.9 (H) 1.7 - 7.7 K/uL   Lymphocytes Relative 6 %   Lymphs Abs 0.9 0.7 - 4.0 K/uL   Monocytes Relative 7 %   Monocytes Absolute 1.1 (H) 0.1 - 1.0 K/uL   Eosinophils Relative 0 %   Eosinophils Absolute 0.0 0.0 - 0.7 K/uL   Basophils Relative 0 %   Basophils Absolute 0.0 0.0 - 0.1 K/uL  Urinalysis, Routine w reflex microscopic (not at [facility])  Result Value Ref Range   Color, Urine YELLOW YELLOW   APPearance CLEAR CLEAR   Specific Gravity, Urine 1.011 1.005 - 1.030   pH 5.0 5.0 - 8.0   Glucose, UA 250 (A) NEGATIVE mg/dL   Hgb urine dipstick LARGE (A) NEGATIVE   Bilirubin Urine NEGATIVE NEGATIVE   Ketones, ur NEGATIVE NEGATIVE mg/dL   Protein, ur 100 (A) NEGATIVE mg/dL   Nitrite NEGATIVE NEGATIVE   Leukocytes, UA NEGATIVE NEGATIVE  CK  Result Value Ref Range   Total CK 1,412 (H) 49 - 397 U/L  Urine microscopic-add on  Result Value Ref Range   Squamous Epithelial / LPF NONE SEEN NONE SEEN   WBC, UA NONE SEEN 0 - 5 WBC/hpf   RBC / HPF 0-5 0 - 5 RBC/hpf   Bacteria, UA NONE SEEN NONE SEEN   Casts HYALINE CASTS (A) NEGATIVE  CBG monitoring, ED  Result Value Ref Range   Glucose-Capillary 66 65 - 99 mg/dL  POC occult blood, ED  Result Value Ref Range   Fecal Occult Bld NEGATIVE NEGATIVE  CBG monitoring, ED  Result Value Ref Range   Glucose-Capillary 106 (H) 65 - 99 mg/dL  CBG monitoring, ED  Result Value Ref Range   Glucose-Capillary 138 (H) 65 - 99 mg/dL   Ct Head Wo Contrast  Result Date: 08/17/2016 CLINICAL DATA:  Found unresponsive with left orbital hematoma EXAM: CT HEAD WITHOUT CONTRAST CT CERVICAL SPINE WITHOUT CONTRAST CT ORBITS WITHOUT CONTRAST TECHNIQUE: Multidetector CT imaging of the head, orbits and cervical spine was performed following the  standard protocol without intravenous contrast. Multiplanar CT image reconstructions were also generated. COMPARISON:  None. FINDINGS: CT HEAD FINDINGS Brain: No evidence of acute infarction, hemorrhage, hydrocephalus, extra-axial collection or mass lesion/mass effect. Mild atrophic changes are noted. Vascular: No hyperdense vessel or unexpected calcification. Skull: Normal. Negative for fracture or focal lesion. Sinuses/Orbits: Mucosal thickening is noted within the left maxillary antrum. Other: Soft tissue swelling is noted over the left frontal region with a small hematoma measuring 2.6 cm in greatest dimension. CT CERVICAL SPINE FINDINGS Alignment: Normal. Skull base and vertebrae: 7 cervical segments are well visualized. Vertebral body height is well maintained. Facet hypertrophic changes are noted. No acute fracture is seen. Soft tissues and spinal canal: No prevertebral fluid or swelling. No visible canal hematoma. Disc levels: Disc space narrowing is noted at C3-4, C4-5, C5-6 and C6-7 with associated anterior and posterior osteophytes. This contributes to multilevel mild spinal canal stenosis and neural foraminal narrowing. Upper chest: Negative. Other: None CT ORBITS FINDINGS Orbits: The orbits and their contents are within normal limits. No definitive orbital wall fracture is noted. No other fractures are noted. Visualized sinuses: Mucosal thickening is noted within the left maxillary  antrum. Soft tissues: Left frontal soft tissue hematoma is noted consistent with the recent injury. IMPRESSION: CT of the head:  No acute intracranial abnormality noted. Atrophic changes. Left frontal soft tissue hematoma. CT orbits: No acute abnormality of the orbits. Left maxillary mucosal thickening. Left frontal soft tissue hematoma. CT of cervical spine: Multilevel degenerative change without acute abnormality. Electronically Signed   By: Inez Catalina M.D.   On: 08/17/2016 12:34   Ct Cervical Spine Wo  Contrast  Result Date: 08/17/2016 CLINICAL DATA:  Found unresponsive with left orbital hematoma EXAM: CT HEAD WITHOUT CONTRAST CT CERVICAL SPINE WITHOUT CONTRAST CT ORBITS WITHOUT CONTRAST TECHNIQUE: Multidetector CT imaging of the head, orbits and cervical spine was performed following the standard protocol without intravenous contrast. Multiplanar CT image reconstructions were also generated. COMPARISON:  None. FINDINGS: CT HEAD FINDINGS Brain: No evidence of acute infarction, hemorrhage, hydrocephalus, extra-axial collection or mass lesion/mass effect. Mild atrophic changes are noted. Vascular: No hyperdense vessel or unexpected calcification. Skull: Normal. Negative for fracture or focal lesion. Sinuses/Orbits: Mucosal thickening is noted within the left maxillary antrum. Other: Soft tissue swelling is noted over the left frontal region with a small hematoma measuring 2.6 cm in greatest dimension. CT CERVICAL SPINE FINDINGS Alignment: Normal. Skull base and vertebrae: 7 cervical segments are well visualized. Vertebral body height is well maintained. Facet hypertrophic changes are noted. No acute fracture is seen. Soft tissues and spinal canal: No prevertebral fluid or swelling. No visible canal hematoma. Disc levels: Disc space narrowing is noted at C3-4, C4-5, C5-6 and C6-7 with associated anterior and posterior osteophytes. This contributes to multilevel mild spinal canal stenosis and neural foraminal narrowing. Upper chest: Negative. Other: None CT ORBITS FINDINGS Orbits: The orbits and their contents are within normal limits. No definitive orbital wall fracture is noted. No other fractures are noted. Visualized sinuses: Mucosal thickening is noted within the left maxillary antrum. Soft tissues: Left frontal soft tissue hematoma is noted consistent with the recent injury. IMPRESSION: CT of the head:  No acute intracranial abnormality noted. Atrophic changes. Left frontal soft tissue hematoma. CT orbits: No  acute abnormality of the orbits. Left maxillary mucosal thickening. Left frontal soft tissue hematoma. CT of cervical spine: Multilevel degenerative change without acute abnormality. Electronically Signed   By: Inez Catalina M.D.   On: 08/17/2016 12:34   Ct Orbits Wo Contrast  Result Date: 08/17/2016 CLINICAL DATA:  Found unresponsive with left orbital hematoma EXAM: CT HEAD WITHOUT CONTRAST CT CERVICAL SPINE WITHOUT CONTRAST CT ORBITS WITHOUT CONTRAST TECHNIQUE: Multidetector CT imaging of the head, orbits and cervical spine was performed following the standard protocol without intravenous contrast. Multiplanar CT image reconstructions were also generated. COMPARISON:  None. FINDINGS: CT HEAD FINDINGS Brain: No evidence of acute infarction, hemorrhage, hydrocephalus, extra-axial collection or mass lesion/mass effect. Mild atrophic changes are noted. Vascular: No hyperdense vessel or unexpected calcification. Skull: Normal. Negative for fracture or focal lesion. Sinuses/Orbits: Mucosal thickening is noted within the left maxillary antrum. Other: Soft tissue swelling is noted over the left frontal region with a small hematoma measuring 2.6 cm in greatest dimension. CT CERVICAL SPINE FINDINGS Alignment: Normal. Skull base and vertebrae: 7 cervical segments are well visualized. Vertebral body height is well maintained. Facet hypertrophic changes are noted. No acute fracture is seen. Soft tissues and spinal canal: No prevertebral fluid or swelling. No visible canal hematoma. Disc levels: Disc space narrowing is noted at C3-4, C4-5, C5-6 and C6-7 with associated anterior and posterior osteophytes. This contributes to  multilevel mild spinal canal stenosis and neural foraminal narrowing. Upper chest: Negative. Other: None CT ORBITS FINDINGS Orbits: The orbits and their contents are within normal limits. No definitive orbital wall fracture is noted. No other fractures are noted. Visualized sinuses: Mucosal thickening is  noted within the left maxillary antrum. Soft tissues: Left frontal soft tissue hematoma is noted consistent with the recent injury. IMPRESSION: CT of the head:  No acute intracranial abnormality noted. Atrophic changes. Left frontal soft tissue hematoma. CT orbits: No acute abnormality of the orbits. Left maxillary mucosal thickening. Left frontal soft tissue hematoma. CT of cervical spine: Multilevel degenerative change without acute abnormality. Electronically Signed   By: Inez Catalina M.D.   On: 08/17/2016 12:34    Initial Impression / Assessment and Plan / ED Course  I have reviewed the triage vital signs and the nursing notes.  Pertinent labs & imaging results that were available during my care of the patient were reviewed by me and considered in my medical decision making (see chart for details). Nursing unable to establish peripheral IV access. I inserted a right-sided external jugular venous catheter Angiocath insertion Performed by: Orlie Dakin  Consent: Verbal consent obtained. Risks and benefits: risks, benefits and alternatives were discussed Time out: Immediately prior to procedure a "time out" was called to verify the correct patient, procedure, equipment, support staff and site/side marked as required.  Preparation: Patient was prepped and draped in the usual sterile fashion.  Vein Location: Right external jugular vein    Gauge: 20  Normal blood return and flush without difficulty Patient tolerance: Patient tolerated the procedure well with no immediate complications.   Clinical Course    Administered D50 intravenously for CBG of 66 the pain at 9:59 AM 1:45 PM patient resting comfortably. Alert Glasgow Coma Score 15. Patient with rhabdomyolysis and acute kidney injury.Dr Janne Napoleon consulted and will see pt in the ED Plan IV hydration. if CK continues to climb or if renal function worsens he may need consultation with nephrology service and bicarbonate intravenous  drip. He will be admitted to telemetry floor . Results for orders placed or performed during the hospital encounter of 08/17/16  Comprehensive metabolic panel  Result Value Ref Range   Sodium 141 135 - 145 mmol/L   Potassium 4.3 3.5 - 5.1 mmol/L   Chloride 113 (H) 101 - 111 mmol/L   CO2 19 (L) 22 - 32 mmol/L   Glucose, Bld 118 (H) 65 - 99 mg/dL   BUN 36 (H) 6 - 20 mg/dL   Creatinine, Ser 2.01 (H) 0.61 - 1.24 mg/dL   Calcium 9.0 8.9 - 10.3 mg/dL   Total Protein 7.3 6.5 - 8.1 g/dL   Albumin 3.5 3.5 - 5.0 g/dL   AST 49 (H) 15 - 41 U/L   ALT 22 17 - 63 U/L   Alkaline Phosphatase 173 (H) 38 - 126 U/L   Total Bilirubin 0.4 0.3 - 1.2 mg/dL   GFR calc non Af Amer 32 (L) >60 mL/min   GFR calc Af Amer 37 (L) >60 mL/min   Anion gap 9 5 - 15  CBC with Differential/Platelet  Result Value Ref Range   WBC 14.9 (H) 4.0 - 10.5 K/uL   RBC 3.72 (L) 4.22 - 5.81 MIL/uL   Hemoglobin 11.4 (L) 13.0 - 17.0 g/dL   HCT 34.7 (L) 39.0 - 52.0 %   MCV 93.3 78.0 - 100.0 fL   MCH 30.6 26.0 - 34.0 pg   MCHC 32.9 30.0 - 36.0  g/dL   RDW 13.8 11.5 - 15.5 %   Platelets 244 150 - 400 K/uL   Neutrophils Relative % 87 %   Neutro Abs 12.9 (H) 1.7 - 7.7 K/uL   Lymphocytes Relative 6 %   Lymphs Abs 0.9 0.7 - 4.0 K/uL   Monocytes Relative 7 %   Monocytes Absolute 1.1 (H) 0.1 - 1.0 K/uL   Eosinophils Relative 0 %   Eosinophils Absolute 0.0 0.0 - 0.7 K/uL   Basophils Relative 0 %   Basophils Absolute 0.0 0.0 - 0.1 K/uL  Urinalysis, Routine w reflex microscopic (not at [facility])  Result Value Ref Range   Color, Urine YELLOW YELLOW   APPearance CLEAR CLEAR   Specific Gravity, Urine 1.011 1.005 - 1.030   pH 5.0 5.0 - 8.0   Glucose, UA 250 (A) NEGATIVE mg/dL   Hgb urine dipstick LARGE (A) NEGATIVE   Bilirubin Urine NEGATIVE NEGATIVE   Ketones, ur NEGATIVE NEGATIVE mg/dL   Protein, ur 100 (A) NEGATIVE mg/dL   Nitrite NEGATIVE NEGATIVE   Leukocytes, UA NEGATIVE NEGATIVE  CK  Result Value Ref Range   Total CK 1,412  (H) 49 - 397 U/L  Urine microscopic-add on  Result Value Ref Range   Squamous Epithelial / LPF NONE SEEN NONE SEEN   WBC, UA NONE SEEN 0 - 5 WBC/hpf   RBC / HPF 0-5 0 - 5 RBC/hpf   Bacteria, UA NONE SEEN NONE SEEN   Casts HYALINE CASTS (A) NEGATIVE  CBG monitoring, ED  Result Value Ref Range   Glucose-Capillary 66 65 - 99 mg/dL  POC occult blood, ED  Result Value Ref Range   Fecal Occult Bld NEGATIVE NEGATIVE  CBG monitoring, ED  Result Value Ref Range   Glucose-Capillary 106 (H) 65 - 99 mg/dL  CBG monitoring, ED  Result Value Ref Range   Glucose-Capillary 138 (H) 65 - 99 mg/dL  CBG monitoring, ED  Result Value Ref Range   Glucose-Capillary 117 (H) 65 - 99 mg/dL   Ct Head Wo Contrast  Result Date: 08/17/2016 CLINICAL DATA:  Found unresponsive with left orbital hematoma EXAM: CT HEAD WITHOUT CONTRAST CT CERVICAL SPINE WITHOUT CONTRAST CT ORBITS WITHOUT CONTRAST TECHNIQUE: Multidetector CT imaging of the head, orbits and cervical spine was performed following the standard protocol without intravenous contrast. Multiplanar CT image reconstructions were also generated. COMPARISON:  None. FINDINGS: CT HEAD FINDINGS Brain: No evidence of acute infarction, hemorrhage, hydrocephalus, extra-axial collection or mass lesion/mass effect. Mild atrophic changes are noted. Vascular: No hyperdense vessel or unexpected calcification. Skull: Normal. Negative for fracture or focal lesion. Sinuses/Orbits: Mucosal thickening is noted within the left maxillary antrum. Other: Soft tissue swelling is noted over the left frontal region with a small hematoma measuring 2.6 cm in greatest dimension. CT CERVICAL SPINE FINDINGS Alignment: Normal. Skull base and vertebrae: 7 cervical segments are well visualized. Vertebral body height is well maintained. Facet hypertrophic changes are noted. No acute fracture is seen. Soft tissues and spinal canal: No prevertebral fluid or swelling. No visible canal hematoma. Disc  levels: Disc space narrowing is noted at C3-4, C4-5, C5-6 and C6-7 with associated anterior and posterior osteophytes. This contributes to multilevel mild spinal canal stenosis and neural foraminal narrowing. Upper chest: Negative. Other: None CT ORBITS FINDINGS Orbits: The orbits and their contents are within normal limits. No definitive orbital wall fracture is noted. No other fractures are noted. Visualized sinuses: Mucosal thickening is noted within the left maxillary antrum. Soft tissues: Left frontal soft  tissue hematoma is noted consistent with the recent injury. IMPRESSION: CT of the head:  No acute intracranial abnormality noted. Atrophic changes. Left frontal soft tissue hematoma. CT orbits: No acute abnormality of the orbits. Left maxillary mucosal thickening. Left frontal soft tissue hematoma. CT of cervical spine: Multilevel degenerative change without acute abnormality. Electronically Signed   By: Inez Catalina M.D.   On: 08/17/2016 12:34   Ct Cervical Spine Wo Contrast  Result Date: 08/17/2016 CLINICAL DATA:  Found unresponsive with left orbital hematoma EXAM: CT HEAD WITHOUT CONTRAST CT CERVICAL SPINE WITHOUT CONTRAST CT ORBITS WITHOUT CONTRAST TECHNIQUE: Multidetector CT imaging of the head, orbits and cervical spine was performed following the standard protocol without intravenous contrast. Multiplanar CT image reconstructions were also generated. COMPARISON:  None. FINDINGS: CT HEAD FINDINGS Brain: No evidence of acute infarction, hemorrhage, hydrocephalus, extra-axial collection or mass lesion/mass effect. Mild atrophic changes are noted. Vascular: No hyperdense vessel or unexpected calcification. Skull: Normal. Negative for fracture or focal lesion. Sinuses/Orbits: Mucosal thickening is noted within the left maxillary antrum. Other: Soft tissue swelling is noted over the left frontal region with a small hematoma measuring 2.6 cm in greatest dimension. CT CERVICAL SPINE FINDINGS Alignment:  Normal. Skull base and vertebrae: 7 cervical segments are well visualized. Vertebral body height is well maintained. Facet hypertrophic changes are noted. No acute fracture is seen. Soft tissues and spinal canal: No prevertebral fluid or swelling. No visible canal hematoma. Disc levels: Disc space narrowing is noted at C3-4, C4-5, C5-6 and C6-7 with associated anterior and posterior osteophytes. This contributes to multilevel mild spinal canal stenosis and neural foraminal narrowing. Upper chest: Negative. Other: None CT ORBITS FINDINGS Orbits: The orbits and their contents are within normal limits. No definitive orbital wall fracture is noted. No other fractures are noted. Visualized sinuses: Mucosal thickening is noted within the left maxillary antrum. Soft tissues: Left frontal soft tissue hematoma is noted consistent with the recent injury. IMPRESSION: CT of the head:  No acute intracranial abnormality noted. Atrophic changes. Left frontal soft tissue hematoma. CT orbits: No acute abnormality of the orbits. Left maxillary mucosal thickening. Left frontal soft tissue hematoma. CT of cervical spine: Multilevel degenerative change without acute abnormality. Electronically Signed   By: Inez Catalina M.D.   On: 08/17/2016 12:34   Ct Orbits Wo Contrast  Result Date: 08/17/2016 CLINICAL DATA:  Found unresponsive with left orbital hematoma EXAM: CT HEAD WITHOUT CONTRAST CT CERVICAL SPINE WITHOUT CONTRAST CT ORBITS WITHOUT CONTRAST TECHNIQUE: Multidetector CT imaging of the head, orbits and cervical spine was performed following the standard protocol without intravenous contrast. Multiplanar CT image reconstructions were also generated. COMPARISON:  None. FINDINGS: CT HEAD FINDINGS Brain: No evidence of acute infarction, hemorrhage, hydrocephalus, extra-axial collection or mass lesion/mass effect. Mild atrophic changes are noted. Vascular: No hyperdense vessel or unexpected calcification. Skull: Normal. Negative for  fracture or focal lesion. Sinuses/Orbits: Mucosal thickening is noted within the left maxillary antrum. Other: Soft tissue swelling is noted over the left frontal region with a small hematoma measuring 2.6 cm in greatest dimension. CT CERVICAL SPINE FINDINGS Alignment: Normal. Skull base and vertebrae: 7 cervical segments are well visualized. Vertebral body height is well maintained. Facet hypertrophic changes are noted. No acute fracture is seen. Soft tissues and spinal canal: No prevertebral fluid or swelling. No visible canal hematoma. Disc levels: Disc space narrowing is noted at C3-4, C4-5, C5-6 and C6-7 with associated anterior and posterior osteophytes. This contributes to multilevel mild spinal canal stenosis and  neural foraminal narrowing. Upper chest: Negative. Other: None CT ORBITS FINDINGS Orbits: The orbits and their contents are within normal limits. No definitive orbital wall fracture is noted. No other fractures are noted. Visualized sinuses: Mucosal thickening is noted within the left maxillary antrum. Soft tissues: Left frontal soft tissue hematoma is noted consistent with the recent injury. IMPRESSION: CT of the head:  No acute intracranial abnormality noted. Atrophic changes. Left frontal soft tissue hematoma. CT orbits: No acute abnormality of the orbits. Left maxillary mucosal thickening. Left frontal soft tissue hematoma. CT of cervical spine: Multilevel degenerative change without acute abnormality. Electronically Signed   By: Inez Catalina M.D.   On: 08/17/2016 12:34   Final Clinical Impressions(s) / ED Diagnoses  Diagnosis #1 fall #2 head injury #3 rhabdomyolysis #4 acute kidney injury #5 hypoglycemia Final diagnoses:  None   CRITICAL CARE Performed by: Orlie Dakin Total critical care time: 30 minutes Critical care time was exclusive of separately billable procedures and treating other patients. Critical care was necessary to treat or prevent imminent or life-threatening  deterioration. Critical care was time spent personally by me on the following activities: development of treatment plan with patient and/or surrogate as well as nursing, discussions with consultants, evaluation of patient's response to treatment, examination of patient, obtaining history from patient or surrogate, ordering and performing treatments and interventions, ordering and review of laboratory studies, ordering and review of radiographic studies, pulse oximetry and re-evaluation of patient's condition. New Prescriptions New Prescriptions   No medications on file     Orlie Dakin, MD 08/17/16 [place], MD 08/17/16 [place], MD 08/17/16 1406

## 2016-08-17 NOTE — ED Triage Notes (Signed)
BIB [facility] from home, pt. Lives alone and was found by daughter on floor around 0900 this am. Daughter called EMS. Upon arrival EMS reports pt. Was unresponsive and found being bag ventilated by Fire department. EMS placed pt. On non rebreather. CBG was 37. EMS gave 1 mg glucagon and report that next CBG check was 456 @ 0915. Pt. Takes Humalog at home for diabetes. Pt. Also on Zorelto. Hematoma to left eye and right side of the face from fall. Upon arrival to ER CBG 66.

## 2016-08-17 NOTE — ED Notes (Signed)
Pt states he cannot provide urine sample at this time. Pt. Offered in-out cath, pt. Refused cath at this time. States he will provide urine.

## 2016-08-17 NOTE — ED Notes (Signed)
Patient transported to CT 

## 2016-08-18 LAB — CBC
HCT: 28.7 % — ABNORMAL LOW (ref 39.0–52.0)
Hemoglobin: 9.2 g/dL — ABNORMAL LOW (ref 13.0–17.0)
MCH: 29.9 pg (ref 26.0–34.0)
MCHC: 32.1 g/dL (ref 30.0–36.0)
MCV: 93.2 fL (ref 78.0–100.0)
Platelets: 208 10*3/uL (ref 150–400)
RBC: 3.08 MIL/uL — ABNORMAL LOW (ref 4.22–5.81)
RDW: 14.3 % (ref 11.5–15.5)
WBC: 6.7 10*3/uL (ref 4.0–10.5)

## 2016-08-18 LAB — COMPREHENSIVE METABOLIC PANEL
ALT: 17 U/L (ref 17–63)
AST: 28 U/L (ref 15–41)
Albumin: 2.7 g/dL — ABNORMAL LOW (ref 3.5–5.0)
Alkaline Phosphatase: 129 U/L — ABNORMAL HIGH (ref 38–126)
Anion gap: 6 (ref 5–15)
BUN: 28 mg/dL — ABNORMAL HIGH (ref 6–20)
CO2: 20 mmol/L — ABNORMAL LOW (ref 22–32)
Calcium: 8.1 mg/dL — ABNORMAL LOW (ref 8.9–10.3)
Chloride: 114 mmol/L — ABNORMAL HIGH (ref 101–111)
Creatinine, Ser: 1.51 mg/dL — ABNORMAL HIGH (ref 0.61–1.24)
GFR calc Af Amer: 52 mL/min — ABNORMAL LOW (ref >60)
GFR calc non Af Amer: 45 mL/min — ABNORMAL LOW (ref >60)
Glucose, Bld: 116 mg/dL — ABNORMAL HIGH (ref 65–99)
Potassium: 4.2 mmol/L (ref 3.5–5.1)
Sodium: 140 mmol/L (ref 135–145)
Total Bilirubin: 0.6 mg/dL (ref 0.3–1.2)
Total Protein: 5.8 g/dL — ABNORMAL LOW (ref 6.5–8.1)

## 2016-08-18 LAB — GLUCOSE, CAPILLARY
Glucose-Capillary: 101 mg/dL — ABNORMAL HIGH (ref 65–99)
Glucose-Capillary: 187 mg/dL — ABNORMAL HIGH (ref 65–99)
Glucose-Capillary: 242 mg/dL — ABNORMAL HIGH (ref 65–99)
Glucose-Capillary: 251 mg/dL — ABNORMAL HIGH (ref 65–99)
Glucose-Capillary: 47 mg/dL — ABNORMAL LOW (ref 65–99)
Glucose-Capillary: 92 mg/dL (ref 65–99)

## 2016-08-18 LAB — HEMOGLOBIN A1C
Hgb A1c MFr Bld: 8.9 % — ABNORMAL HIGH (ref 4.8–5.6)
Mean Plasma Glucose: 209 mg/dL

## 2016-08-18 LAB — MRSA PCR SCREENING: MRSA by PCR: NEGATIVE

## 2016-08-18 LAB — CK: Total CK: 834 U/L — ABNORMAL HIGH (ref 49–397)

## 2016-08-18 LAB — TSH: TSH: 0.275 u[IU]/mL — ABNORMAL LOW (ref 0.350–4.500)

## 2016-08-18 MED ORDER — MORPHINE SULFATE (PF) 2 MG/ML IV SOLN
1.0000 mg | INTRAVENOUS | Status: DC | PRN
  Administered 2016-08-18 – 2016-08-19 (×4): 2 mg via INTRAVENOUS
  Filled 2016-08-18 (×4): qty 1

## 2016-08-18 MED ORDER — SODIUM CHLORIDE 0.9 % IV SOLN
INTRAVENOUS | Status: DC
  Administered 2016-08-18: 20:00:00 via INTRAVENOUS

## 2016-08-18 MED ORDER — HYDROCODONE-ACETAMINOPHEN 5-325 MG PO TABS
1.0000 | ORAL_TABLET | ORAL | Status: DC | PRN
  Administered 2016-08-18 – 2016-08-19 (×3): 1 via ORAL
  Filled 2016-08-18 (×3): qty 1

## 2016-08-18 NOTE — Progress Notes (Signed)
Hypoglycemic Event  CBG: 47  Treatment: 15 GM carbohydrate snack (orange juice and pudding)  Symptoms: None  Follow-up CBG: Time:0106 CBG Result: 101  Possible Reasons for Event: Unknown      [name] [name]

## 2016-08-18 NOTE — Progress Notes (Signed)
CSW received consult regarding PT recommendation of SNF at discharge.  Patient is refusing SNF and says he wants to go straight home with home health services. He states his daughter and sisters help him out. CSW also spoke with patient's daughter who states she will make sure he has everything he needs.  CSW signing off.   Percell Locus Becky Colan LCSWA [phone]

## 2016-08-18 NOTE — Progress Notes (Signed)
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 Presented to the ED 11/05 after being found unresponsive on the floor by his daughter. Found to have hypoglycemia on EMS arrival (CBG 37), left eye ecchymosis. Patient last seen by his daughter at 7pm the previous night. CT did not show acute intracranial or orbit abnormalities. Patient admitted to inpatient service for further evaluation and treatment.  Assessment & Plan: Principal Problem:   Hypoglycemia Active Problems:   CKD (chronic kidney disease) stage 3, GFR 30-59 ml/min   Uncontrolled type 2 diabetes mellitus with diabetic nephropathy, with long-term current use of insulin ([name])   Acute on chronic renal failure ([name])   Fall   Rhabdomyolysis   Noncompliance with medication regimen   On continuous oral anticoagulation  Hypoglycemia / unresponsive s/p fall - Found unresponsive on the ground at home. CBG 37 by EMS.  - Medication compliance in question. Patient reports 70/30 NPH/Regular insulin 20 units BID at home (~10am and ~6pm). However the pharmacist confirms Lantus prescription is active however Novolog has not been filled since August. I wonder wether patient is self-administering Lantus BID - Continue SSI, hypoglycemic overnight, continue to monitor in inpatient setting - Measure TSH  Chronic Kidney Disease stage III - IVF, Cr improved 2.01 -> 1.51  Rhabdomyolysis - Secondary to being down - CK improved 1,412 -> 834  Left eye ecchymosis/contusion from fall - CT of head showed no acute intracranial or orbit abnormalities. Left frontal soft tissue hematoma seen.   Uncontrolled type 2 diabetes mellitus with diabetic nephropathy - Continue to hold lantus - Continue  sensitive SSI  Malignant hypertension - Continue Norvasc, Coreg, Hydralazine PRN   Anticoagulation for history of DVT (07/06/2016) - No acute intracranial findings on CT - Continue Xarelto - PT evaluation for fall risk    DVT prophylaxis: Xarelto Code Status: Full Family Communication: None at bedside Disposition Plan: PT to evaluate  Consultants:  None  Procedures:  None  Antimicrobials: None  Subjective: Patient is having some pain in left leg, arm, and face. No acute distress.  Denies dizziness, chest pain, SOB, abdominal pain, back pain, nausea, vomiting, dysuria.  Objective: Vitals:   08/17/16 1645 08/17/16 2138 08/18/16 0455 08/18/16 0822  BP: (!) 167/77 (!) 123/58 (!) 148/70 (!) 163/73  Pulse: 81 65 73 65  Resp: 18 18 18    Temp: 99.5 F (37.5 C) 98.4 F (36.9 C) 99.2 F (37.3 C)   TempSrc: Oral Oral Oral   SpO2: 100% 100% 100% 99%  Weight:      Height:        Intake/Output Summary (Last 24 hours) at 08/18/16 1117 Last data filed at 08/18/16 0740  Gross per 24 hour  Intake             1475 ml  Output             1650 ml  Net             -175 ml   Filed Weights   08/17/16 1009 08/17/16 1601  Weight: 65.3 kg (144 lb) 67.1 kg (148 lb)    Examination: Constitutional: NAD Vitals:   08/17/16 1645 08/17/16 2138 08/18/16 0455 08/18/16 0822  BP: (!) 167/77 (!) 123/58 (!) 148/70 (!) 163/73  Pulse: 81 65 73 65  Resp: 18 18 18    Temp: 99.5 F (37.5 C) 98.4 F (36.9 C) 99.2 F (37.3 C)   TempSrc: Oral Oral Oral   SpO2: 100% 100% 100% 99%  Weight:      Height:       Eyes: PERRL, lids and conjunctivae normal Respiratory: clear to auscultation bilaterally, no wheezing, no crackles.  Cardiovascular: Regular rate and rhythm, no murmurs / rubs / gallops. No LE edema. 2+ pedal pulses.   Abdomen: no tenderness. Bowel sounds positive.  Musculoskeletal: no clubbing / cyanosis. Skin: no rashes, lesions, ulcers. No induration. Ecchymosis of the left  eye. Neurologic: CN 2-12 grossly intact. Strength 5/5 in all 4. Decreased hearing in left ear.  Psychiatric: Normal judgment and insight. Alert and oriented x 3. Normal mood.    Data Reviewed: I have personally reviewed following labs and imaging studies  CBC:  Recent Labs Lab 08/17/16 1034 08/18/16 0607  WBC 14.9* 6.7  NEUTROABS 12.9*  --   HGB 11.4* 9.2*  HCT 34.7* 28.7*  MCV 93.3 93.2  PLT 244 782   Basic Metabolic Panel:  Recent Labs Lab 08/17/16 1034 08/18/16 0607  NA 141 140  K 4.3 4.2  CL 113* 114*  CO2 19* 20*  GLUCOSE 118* 116*  BUN 36* 28*  CREATININE 2.01* 1.51*  CALCIUM 9.0 8.1*   GFR: Estimated Creatinine Clearance: 42.6 mL/min (by C-G formula based on SCr of 1.51 mg/dL (H)). Liver Function Tests:  Recent Labs Lab 08/17/16 1034 08/18/16 0607  AST 49* 28  ALT 22 17  ALKPHOS 173* 129*  BILITOT 0.4 0.6  PROT 7.3 5.8*  ALBUMIN 3.5 2.7*   No results for input(s): LIPASE, AMYLASE in the last 168 hours. No results for input(s): AMMONIA in the last 168 hours. Coagulation Profile: No results for input(s): INR, PROTIME in the last 168 hours. Cardiac Enzymes:  Recent Labs Lab 08/17/16 1034 08/18/16 0607  CKTOTAL 1,412* 834*   BNP (last 3 results) No results for input(s): PROBNP in the last 8760 hours. HbA1C:  Recent Labs  08/17/16 1456  HGBA1C 8.9*   CBG:  Recent Labs Lab 08/17/16 1703 08/17/16 2138 08/18/16 0039 08/18/16 0106 08/18/16 0738  GLUCAP 291* 71 47* 101* 92   Lipid Profile: No results for input(s): CHOL, HDL, LDLCALC, TRIG, CHOLHDL, LDLDIRECT in the last 72 hours. Thyroid Function Tests: No results for input(s): TSH, T4TOTAL, FREET4, T3FREE, THYROIDAB in the last 72 hours. Anemia Panel: No results for input(s): VITAMINB12, FOLATE, FERRITIN, TIBC, IRON, RETICCTPCT in the last 72 hours. Urine analysis:    Component Value Date/Time   COLORURINE YELLOW 08/17/2016 1108   APPEARANCEUR CLEAR 08/17/2016 1108   LABSPEC  1.011 08/17/2016 1108   PHURINE 5.0 08/17/2016 1108   GLUCOSEU 250 (A) 08/17/2016 1108   HGBUR LARGE (A) 08/17/2016 1108   BILIRUBINUR NEGATIVE 08/17/2016 1108   KETONESUR NEGATIVE 08/17/2016 1108   PROTEINUR 100 (A) 08/17/2016 1108   UROBILINOGEN 0.2 08/21/2015 2154   NITRITE NEGATIVE 08/17/2016 1108   LEUKOCYTESUR NEGATIVE 08/17/2016 1108   Sepsis Labs: Invalid input(s): PROCALCITONIN, LACTICIDVEN  No results found for this or any previous visit (from the past 240 hour(s)).    Radiology Studies: Ct Head Wo Contrast  Result Date: 08/17/2016 CLINICAL DATA:  Found unresponsive with left orbital hematoma EXAM: CT HEAD WITHOUT CONTRAST CT CERVICAL SPINE WITHOUT CONTRAST CT ORBITS WITHOUT CONTRAST TECHNIQUE: Multidetector CT imaging of the head, orbits and cervical spine was performed following the standard  protocol without intravenous contrast. Multiplanar CT image reconstructions were also generated. COMPARISON:  None. FINDINGS: CT HEAD FINDINGS Brain: No evidence of acute infarction, hemorrhage, hydrocephalus, extra-axial collection or mass lesion/mass effect. Mild atrophic changes are noted. Vascular: No hyperdense vessel or unexpected calcification. Skull: Normal. Negative for fracture or focal lesion. Sinuses/Orbits: Mucosal thickening is noted within the left maxillary antrum. Other: Soft tissue swelling is noted over the left frontal region with a small hematoma measuring 2.6 cm in greatest dimension. CT CERVICAL SPINE FINDINGS Alignment: Normal. Skull base and vertebrae: 7 cervical segments are well visualized. Vertebral body height is well maintained. Facet hypertrophic changes are noted. No acute fracture is seen. Soft tissues and spinal canal: No prevertebral fluid or swelling. No visible canal hematoma. Disc levels: Disc space narrowing is noted at C3-4, C4-5, C5-6 and C6-7 with associated anterior and posterior osteophytes. This contributes to multilevel mild spinal canal stenosis and  neural foraminal narrowing. Upper chest: Negative. Other: None CT ORBITS FINDINGS Orbits: The orbits and their contents are within normal limits. No definitive orbital wall fracture is noted. No other fractures are noted. Visualized sinuses: Mucosal thickening is noted within the left maxillary antrum. Soft tissues: Left frontal soft tissue hematoma is noted consistent with the recent injury. IMPRESSION: CT of the head:  No acute intracranial abnormality noted. Atrophic changes. Left frontal soft tissue hematoma. CT orbits: No acute abnormality of the orbits. Left maxillary mucosal thickening. Left frontal soft tissue hematoma. CT of cervical spine: Multilevel degenerative change without acute abnormality. Electronically Signed   By: Inez Catalina M.D.   On: 08/17/2016 12:34   Ct Cervical Spine Wo Contrast  Result Date: 08/17/2016 CLINICAL DATA:  Found unresponsive with left orbital hematoma EXAM: CT HEAD WITHOUT CONTRAST CT CERVICAL SPINE WITHOUT CONTRAST CT ORBITS WITHOUT CONTRAST TECHNIQUE: Multidetector CT imaging of the head, orbits and cervical spine was performed following the standard protocol without intravenous contrast. Multiplanar CT image reconstructions were also generated. COMPARISON:  None. FINDINGS: CT HEAD FINDINGS Brain: No evidence of acute infarction, hemorrhage, hydrocephalus, extra-axial collection or mass lesion/mass effect. Mild atrophic changes are noted. Vascular: No hyperdense vessel or unexpected calcification. Skull: Normal. Negative for fracture or focal lesion. Sinuses/Orbits: Mucosal thickening is noted within the left maxillary antrum. Other: Soft tissue swelling is noted over the left frontal region with a small hematoma measuring 2.6 cm in greatest dimension. CT CERVICAL SPINE FINDINGS Alignment: Normal. Skull base and vertebrae: 7 cervical segments are well visualized. Vertebral body height is well maintained. Facet hypertrophic changes are noted. No acute fracture is seen.  Soft tissues and spinal canal: No prevertebral fluid or swelling. No visible canal hematoma. Disc levels: Disc space narrowing is noted at C3-4, C4-5, C5-6 and C6-7 with associated anterior and posterior osteophytes. This contributes to multilevel mild spinal canal stenosis and neural foraminal narrowing. Upper chest: Negative. Other: None CT ORBITS FINDINGS Orbits: The orbits and their contents are within normal limits. No definitive orbital wall fracture is noted. No other fractures are noted. Visualized sinuses: Mucosal thickening is noted within the left maxillary antrum. Soft tissues: Left frontal soft tissue hematoma is noted consistent with the recent injury. IMPRESSION: CT of the head:  No acute intracranial abnormality noted. Atrophic changes. Left frontal soft tissue hematoma. CT orbits: No acute abnormality of the orbits. Left maxillary mucosal thickening. Left frontal soft tissue hematoma. CT of cervical spine: Multilevel degenerative change without acute abnormality. Electronically Signed   By: Inez Catalina M.D.   On: 08/17/2016 12:34  Ct Orbits Wo Contrast  Result Date: 08/17/2016 CLINICAL DATA:  Found unresponsive with left orbital hematoma EXAM: CT HEAD WITHOUT CONTRAST CT CERVICAL SPINE WITHOUT CONTRAST CT ORBITS WITHOUT CONTRAST TECHNIQUE: Multidetector CT imaging of the head, orbits and cervical spine was performed following the standard protocol without intravenous contrast. Multiplanar CT image reconstructions were also generated. COMPARISON:  None. FINDINGS: CT HEAD FINDINGS Brain: No evidence of acute infarction, hemorrhage, hydrocephalus, extra-axial collection or mass lesion/mass effect. Mild atrophic changes are noted. Vascular: No hyperdense vessel or unexpected calcification. Skull: Normal. Negative for fracture or focal lesion. Sinuses/Orbits: Mucosal thickening is noted within the left maxillary antrum. Other: Soft tissue swelling is noted over the left frontal region with a small  hematoma measuring 2.6 cm in greatest dimension. CT CERVICAL SPINE FINDINGS Alignment: Normal. Skull base and vertebrae: 7 cervical segments are well visualized. Vertebral body height is well maintained. Facet hypertrophic changes are noted. No acute fracture is seen. Soft tissues and spinal canal: No prevertebral fluid or swelling. No visible canal hematoma. Disc levels: Disc space narrowing is noted at C3-4, C4-5, C5-6 and C6-7 with associated anterior and posterior osteophytes. This contributes to multilevel mild spinal canal stenosis and neural foraminal narrowing. Upper chest: Negative. Other: None CT ORBITS FINDINGS Orbits: The orbits and their contents are within normal limits. No definitive orbital wall fracture is noted. No other fractures are noted. Visualized sinuses: Mucosal thickening is noted within the left maxillary antrum. Soft tissues: Left frontal soft tissue hematoma is noted consistent with the recent injury. IMPRESSION: CT of the head:  No acute intracranial abnormality noted. Atrophic changes. Left frontal soft tissue hematoma. CT orbits: No acute abnormality of the orbits. Left maxillary mucosal thickening. Left frontal soft tissue hematoma. CT of cervical spine: Multilevel degenerative change without acute abnormality. Electronically Signed   By: Inez Catalina M.D.   On: 08/17/2016 12:34     Scheduled Meds: . amLODipine  10 mg Oral Daily  . atorvastatin  10 mg Oral q1800  . carvedilol  6.25 mg Oral BID WC  . insulin aspart  0-9 Units Subcutaneous TID WC  . rivaroxaban  20 mg Oral Q supper  . sodium chloride flush  3 mL Intravenous Q12H  . thiamine  100 mg Oral Daily   Continuous Infusions: . sodium chloride     Marzetta Board, MD, PhD [facility] Pager [phone]  If 7PM-7AM, please contact night-coverage [email or web address] Password [email or web address] 08/18/2016, 11:17 AM

## 2016-08-18 NOTE — Progress Notes (Signed)
Inpatient Diabetes Program Recommendations  AACE/ADA: New Consensus Statement on Inpatient Glycemic Control (2015)  Target Ranges:  Prepandial:   less than 140 mg/dL      Peak postprandial:   less than 180 mg/dL (1-2 hours)      Critically ill patients:  140 - 180 mg/dL   Lab Results  Component Value Date   GLUCAP 242 (H) 08/18/2016   HGBA1C 8.9 (H) 08/17/2016    Review of Glycemic Control:  Results for [name] [name], [name] [name] (MRN [id number]) as of 08/18/2016 13:18  Ref. Range 08/18/2016 00:39 08/18/2016 01:06 08/18/2016 07:38 08/18/2016 11:56  Glucose-Capillary Latest Ref Range: 65 - 99 mg/dL 47 (L) 101 (H) 92 242 (H)   Diabetes history: Type 2 diabetes Outpatient Diabetes medications: Lantus 10 units with supper, Novolog 5 units tid with meals, Novolog correction. Current orders for Inpatient glycemic control:  Novolog sensitive tid with meals and HS Inpatient Diabetes Program Recommendations:    Please consider reducing Novolog correction to start at blood sugar of 151 mg/dL.  Also consider adding Lantus 5 units daily.  May also consider Novolog 2 units tid with meals (hold if patient eats less than 50%).  Note low blood sugar last PM.    Thanks, Adah Perl, RN, BC-ADM Inpatient Diabetes Coordinator Pager [phone] (8a-5p)

## 2016-08-18 NOTE — Evaluation (Signed)
Physical Therapy Evaluation Patient Details Name: [name] [name] MRN: [id number] DOB: December 26, 1945 Today's Date: 08/18/2016   History of Present Illness  [name] [name] is a 70 y.o. male with significant past medical history of chronic kidney disease stage III, diabetes, HTN, CVA, left femur fracture complicated by ABLA, found unresponsive on the floor this morning by his daughter. Arrived to ED with hypoglycemia   Clinical Impression  Pt admitted with above diagnosis. Pt currently with functional limitations due to the deficits listed below (see PT Problem List). Pt ambulated 15' with RW and min A but was exhausted and unable to go further at that point as well as having pain LLE from falling, Do not feel he would currently be safe alone in his apt.  Pt will benefit from skilled PT to increase their independence and safety with mobility to allow discharge to the venue listed below.       Follow Up Recommendations SNF;Supervision/Assistance - 24 hour    Equipment Recommendations  None recommended by PT    Recommendations for Other Services       Precautions / Restrictions Precautions Precautions: Fall Restrictions Weight Bearing Restrictions: No      Mobility  Bed Mobility Overal bed mobility: Needs Assistance Bed Mobility: Supine to Sit     Supine to sit: Supervision     General bed mobility comments: supervision with increased time  Transfers Overall transfer level: Needs assistance Equipment used: Rolling walker (2 wheeled) Transfers: Sit to/from Stand Sit to Stand: Min assist         General transfer comment: min A to steady, increased time needed and vc's for hand placement and mvmt from bed to RW  Ambulation/Gait Ambulation/Gait assistance: Min assist Ambulation Distance (Feet): 15 Feet Assistive device: Rolling walker (2 wheeled) Gait Pattern/deviations: Step-to pattern Gait velocity: decreased Gait velocity interpretation: Below normal speed for  age/gender General Gait Details: minimal wt on LLE, heavy use of RW with UE's, difficulty moving bkwds to chair  Stairs            Wheelchair Mobility    Modified Rankin (Stroke Patients Only)       Balance Overall balance assessment: Needs assistance Sitting-balance support: Feet supported Sitting balance-Leahy Scale: Good     Standing balance support: Bilateral upper extremity supported Standing balance-Leahy Scale: Poor Standing balance comment: requires use of RW to maintain standing                             Pertinent Vitals/Pain Pain Assessment: Faces Faces Pain Scale: Hurts little more Pain Location: L eye (bruised from fall), LLE also hit when fell Pain Descriptors / Indicators: Sore Pain Intervention(s): Limited activity within patient's tolerance;Monitored during session    Home Living Family/patient expects to be discharged to:: Private residence Living Arrangements: Alone Available Help at Discharge: Family;Available PRN/intermittently Type of Home: Apartment Home Access: Elevator;Stairs to enter   Entrance Stairs-Number of Steps: 3+6 to get to the building Home Layout: One level Home Equipment: Walker - 2 wheels Additional Comments: pt does not drive, daughter checks on him regularly    Prior Function Level of Independence: Needs assistance   Gait / Transfers Assistance Needed: ambulates with RW  ADL's / Homemaking Assistance Needed: daughter helps with shopping, cooking, laundry. Pt reports that he bathes and dresses himself but questionable historian  Comments: reports falls     Hand Dominance   Dominant Hand: Right    Extremity/Trunk Assessment  Upper Extremity Assessment: Defer to OT evaluation           Lower Extremity Assessment: LLE deficits/detail;Generalized weakness   LLE Deficits / Details: difficulty bearing wt due to soreness at hip and knee  Cervical / Trunk Assessment: Kyphotic  Communication    Communication: No difficulties  Cognition Arousal/Alertness: Awake/alert Behavior During Therapy: WFL for tasks assessed/performed Overall Cognitive Status: No family/caregiver present to determine baseline cognitive functioning                      General Comments General comments (skin integrity, edema, etc.): pt will likely refuse SNF, saying that he only wants to go home, but at this point do not feel that he could care for himself or mobilize safely around apt    Exercises General Exercises - Lower Extremity Ankle Circles/Pumps: AROM;Both;10 reps;Seated   Assessment/Plan    PT Assessment Patient needs continued PT services  PT Problem List Decreased strength;Decreased activity tolerance;Decreased balance;Decreased mobility;Decreased knowledge of use of DME;Pain;Decreased knowledge of precautions          PT Treatment Interventions DME instruction;Gait training;Functional mobility training;Stair training;Therapeutic activities;Therapeutic exercise;Balance training;Patient/family education    PT Goals (Current goals can be found in the Care Plan section)  Acute Rehab PT Goals Patient Stated Goal: return home PT Goal Formulation: With patient Time For Goal Achievement: 09/01/16 Potential to Achieve Goals: Good    Frequency Min 3X/week   Barriers to discharge Decreased caregiver support      Co-evaluation               End of Session Equipment Utilized During Treatment: Gait belt Activity Tolerance: Patient tolerated treatment well Patient left: in chair;with call bell/phone within reach;with chair alarm set Nurse Communication: Mobility status         Time: 2297-9892 PT Time Calculation (min) (ACUTE ONLY): 26 min   Charges:   PT Evaluation $PT Eval Moderate Complexity: 1 Procedure PT Treatments $Gait Training: 8-22 mins   PT G Codes:       Leighton Roach, PT  Acute Rehab Services  [place], [place] 08/18/2016, 1:14 PM

## 2016-08-19 LAB — GLUCOSE, CAPILLARY
Glucose-Capillary: 258 mg/dL — ABNORMAL HIGH (ref 65–99)
Glucose-Capillary: 295 mg/dL — ABNORMAL HIGH (ref 65–99)

## 2016-08-19 LAB — BASIC METABOLIC PANEL
Anion gap: 9 (ref 5–15)
BUN: 17 mg/dL (ref 6–20)
CO2: 21 mmol/L — ABNORMAL LOW (ref 22–32)
Calcium: 9.3 mg/dL (ref 8.9–10.3)
Chloride: 108 mmol/L (ref 101–111)
Creatinine, Ser: 1.12 mg/dL (ref 0.61–1.24)
GFR calc Af Amer: >60 mL/min (ref >60)
GFR calc non Af Amer: >60 mL/min (ref >60)
Glucose, Bld: 281 mg/dL — ABNORMAL HIGH (ref 65–99)
Potassium: 4.2 mmol/L (ref 3.5–5.1)
Sodium: 138 mmol/L (ref 135–145)

## 2016-08-19 LAB — T4, FREE: Free T4: 1.03 ng/dL (ref 0.61–1.12)

## 2016-08-19 LAB — CK: Total CK: 779 U/L — ABNORMAL HIGH (ref 49–397)

## 2016-08-19 MED ORDER — HYDROCODONE-ACETAMINOPHEN 5-325 MG PO TABS: 1.0000 | ORAL_TABLET | ORAL | 0 refills | Status: DC | PRN

## 2016-08-19 MED ORDER — INSULIN ASPART 100 UNIT/ML FLEXPEN: 2.0000 [IU] | PEN_INJECTOR | Freq: Three times a day (TID) | SUBCUTANEOUS | 0 refills | Status: DC

## 2016-08-19 MED ORDER — INSULIN ASPART 100 UNIT/ML FLEXPEN: 0.0000 [IU] | PEN_INJECTOR | Freq: Three times a day (TID) | SUBCUTANEOUS | 2 refills | Status: DC

## 2016-08-19 MED ORDER — INSULIN GLARGINE 100 UNIT/ML ~~LOC~~ SOLN: 5.0000 [IU] | Freq: Every day | SUBCUTANEOUS | 2 refills | Status: DC

## 2016-08-19 NOTE — Discharge Summary (Signed)
Physician Discharge Summary  [name] [name] XFG:[id number] DOB: 02/28/1946 DOA: 08/17/2016  PCP: Mauricio Po, [name]  Admit date: 08/17/2016 Discharge date: 08/19/2016  Admitted From: Home Disposition:  Home with home health  Recommendations for Outpatient Follow-up:  1. Follow up with PCP in 1 week 2. HHPT, RN on discharge. 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 Presented to the ED 11/05 after being found unresponsive on the floor by his daughter. Found to have hypoglycemia on EMS arrival (CBG 37), left eye ecchymosis. Patient last seen by his daughter at 7pm the previous night. CT did not show acute intracranial or orbit abnormalities. Admitted for further evaluation and treatment. Patient is now stable and at baseline.   Hospital Course: Discharge Diagnoses:  Principal Problem:   Hypoglycemia Active Problems:   CKD (chronic kidney disease) stage 3, GFR 30-59 ml/min   Uncontrolled type 2 diabetes mellitus with diabetic nephropathy, with long-term current use of insulin ([name])   Acute on chronic renal failure ([name])   Fall   Rhabdomyolysis   Noncompliance with medication regimen   On continuous oral anticoagulation   Hypoglycemia / unresponsive s/p fall - Patient was admitted to the hospital with unresponsive episodes and hypoglycemia. CBG 37 by EMS. Patient seems to be somewhat confused about home insulin regimen and which insulin he is taking, or pharmacist called patient's pharmacy and it appears that he has not gotten his NovoLog since August. I wonder whether paient is self-administering Lantus instead of his short-acting insulin. He was initially placed on dextrose  infusion, his hypoglycemia resolved, he was monitored for 24 hours off of any IV glucose and remained stable. Home health RN, PT to further evaluate home environment and medication administration. Left eye ecchymosis/contusion from fall - CT of head showed no acute intracranial or orbit abnormalities. Left frontal soft tissue hematoma seen. Patient with recurrent falls at home, recently had hip fracture, physical therapy evaluate patient while hospitalized and recommended SNF level care, however patient is refusing. Will maximize home health services on discharge. Discussed with care management. Chronic Kidney Disease stage III - IVF, creatine improved 1.12. Back to baseline Rhabdomyolysis - Secondary to laying on the floor. CK improved with hydration. Uncontrolled type 2 diabetes mellitus with diabetic nephropathy - Hypoglycemia resolved, Decrease home Lantus to 5 units on discharge, decrease scheduled Novolog and continue SSI. HHRN to further evaluate in home environment. Hypertension - Lisinopril discontinued during admission. Cr 1.12. Resume Lisinopril at home. Continue home Norvasc, Coreg Anticoagulation for history of DVT (07/06/2016) - No acute intracranial findings on CT, Continue Xarelto, high risk given falls at home  Discharge Instructions - Follow up with PCP in 1 week - Please have safety systems in place for proper insulin injections - Work with home health PT to increase independence and safety    Medication List    STOP taking these medications   lisinopril 10 MG tablet Commonly known as:  PRINIVIL,ZESTRIL     TAKE these medications   acetaminophen 325 MG tablet Commonly known as:  TYLENOL Take 2 tablets (650 mg total) by mouth every 6 (six) hours as needed for mild pain (or Fever >/= 101).   amLODipine 10 MG tablet Commonly known as:  NORVASC  Take 1 tablet (10 mg total) by mouth daily.   atorvastatin 10 MG tablet Commonly known as:  LIPITOR Take 1 tablet (10 mg  total) by mouth daily at 6 PM.   blood glucose meter kit and supplies Use daily to check blood sugar. DX: E11.09   carvedilol 6.25 MG tablet Commonly known as:  COREG Take 1 tablet (6.25 mg total) by mouth 2 (two) times daily with a meal.   cyanocobalamin 1000 MCG/ML injection Commonly known as:  (VITAMIN B-12) Inject 1 mL (1,000 mcg total) into the muscle every 30 (thirty) days. What changed:  when to take this   feeding supplement (GLUCERNA SHAKE) Liqd Take 237 mLs by mouth 2 (two) times daily between meals.   glucose blood test strip Commonly known as:  COOL BLOOD GLUCOSE TEST STRIPS Use as instructed to check blood sugar up to 3 times a day. DX: E11.09   HYDROcodone-acetaminophen 5-325 MG tablet Commonly known as:  NORCO/VICODIN Take 1 tablet by mouth every 4 (four) hours as needed for severe pain.   insulin aspart 100 UNIT/ML FlexPen Commonly known as:  NOVOLOG FLEXPEN Inject 0-12 Units into the skin 3 (three) times daily with meals. Per sliding scale, 0-150 = 0 units, 151-200 = 2 units, 201-250 = 4 units, 251-300 = 6 units, 301-350 = 8 units, 351-400 = 10 units, 401-500 = 12 units (call MD) What changed:  Another medication with the same name was changed. Make sure you understand how and when to take each.  Another medication with the same name was removed. Continue taking this medication, and follow the directions you see here.   insulin aspart 100 UNIT/ML FlexPen Commonly known as:  NOVOLOG Inject 2 Units into the skin 3 (three) times daily with meals. What changed:  how much to take  Another medication with the same name was removed. Continue taking this medication, and follow the directions you see here.   insulin glargine 100 UNIT/ML injection Commonly known as:  LANTUS Inject 0.05 mLs (5 Units total) into the skin daily. What changed:  how much to take   Lancets Misc Use as instructed to check blood sugar up to 3 times a day. DX: E11.09   polyethylene  glycol powder powder Commonly known as:  GLYCOLAX/MIRALAX Take 17 g by mouth every morning.   rivaroxaban 20 MG Tabs tablet Commonly known as:  XARELTO Take 1 tablet (20 mg total) by mouth daily with supper. STARTING October 16.   senna 8.6 MG Tabs tablet Commonly known as:  SENOKOT Take 1 tablet (8.6 mg total) by mouth 2 (two) times daily.   thiamine 100 MG tablet Take 1 tablet (100 mg total) by mouth daily.      Follow-up Information    Mauricio Po, [name] On 08/26/2016.   Specialty:  Family Medicine Why:  Appointment is on 08/26/16 at 4:30pm Contact information: [place] [phone] [phone]        [facility].   Why:  Home health RN,PT  arranged Contact information: [place] [address] [phone]          No Known Allergies  Consultations: None  Procedures/Studies: None  Ct Head Wo Contrast  Result Date: 08/17/2016 CLINICAL DATA:  Found unresponsive with left orbital hematoma EXAM: CT HEAD WITHOUT CONTRAST CT CERVICAL SPINE WITHOUT CONTRAST CT ORBITS WITHOUT CONTRAST TECHNIQUE: Multidetector CT imaging of the head, orbits and cervical spine was performed following the standard protocol without intravenous  contrast. Multiplanar CT image reconstructions were also generated. COMPARISON:  None. FINDINGS: CT HEAD FINDINGS Brain: No evidence of acute infarction, hemorrhage, hydrocephalus, extra-axial collection or mass lesion/mass effect. Mild atrophic changes are noted. Vascular: No hyperdense vessel or unexpected calcification. Skull: Normal. Negative for fracture or focal lesion. Sinuses/Orbits: Mucosal thickening is noted within the left maxillary antrum. Other: Soft tissue swelling is noted over the left frontal region with a small hematoma measuring 2.6 cm in greatest dimension. CT CERVICAL SPINE FINDINGS Alignment: Normal. Skull base and vertebrae: 7 cervical segments are well visualized. Vertebral  body height is well maintained. Facet hypertrophic changes are noted. No acute fracture is seen. Soft tissues and spinal canal: No prevertebral fluid or swelling. No visible canal hematoma. Disc levels: Disc space narrowing is noted at C3-4, C4-5, C5-6 and C6-7 with associated anterior and posterior osteophytes. This contributes to multilevel mild spinal canal stenosis and neural foraminal narrowing. Upper chest: Negative. Other: None CT ORBITS FINDINGS Orbits: The orbits and their contents are within normal limits. No definitive orbital wall fracture is noted. No other fractures are noted. Visualized sinuses: Mucosal thickening is noted within the left maxillary antrum. Soft tissues: Left frontal soft tissue hematoma is noted consistent with the recent injury. IMPRESSION: CT of the head:  No acute intracranial abnormality noted. Atrophic changes. Left frontal soft tissue hematoma. CT orbits: No acute abnormality of the orbits. Left maxillary mucosal thickening. Left frontal soft tissue hematoma. CT of cervical spine: Multilevel degenerative change without acute abnormality. Electronically Signed   By: Inez Catalina M.D.   On: 08/17/2016 12:34   Ct Cervical Spine Wo Contrast  Result Date: 08/17/2016 CLINICAL DATA:  Found unresponsive with left orbital hematoma EXAM: CT HEAD WITHOUT CONTRAST CT CERVICAL SPINE WITHOUT CONTRAST CT ORBITS WITHOUT CONTRAST TECHNIQUE: Multidetector CT imaging of the head, orbits and cervical spine was performed following the standard protocol without intravenous contrast. Multiplanar CT image reconstructions were also generated. COMPARISON:  None. FINDINGS: CT HEAD FINDINGS Brain: No evidence of acute infarction, hemorrhage, hydrocephalus, extra-axial collection or mass lesion/mass effect. Mild atrophic changes are noted. Vascular: No hyperdense vessel or unexpected calcification. Skull: Normal. Negative for fracture or focal lesion. Sinuses/Orbits: Mucosal thickening is noted within  the left maxillary antrum. Other: Soft tissue swelling is noted over the left frontal region with a small hematoma measuring 2.6 cm in greatest dimension. CT CERVICAL SPINE FINDINGS Alignment: Normal. Skull base and vertebrae: 7 cervical segments are well visualized. Vertebral body height is well maintained. Facet hypertrophic changes are noted. No acute fracture is seen. Soft tissues and spinal canal: No prevertebral fluid or swelling. No visible canal hematoma. Disc levels: Disc space narrowing is noted at C3-4, C4-5, C5-6 and C6-7 with associated anterior and posterior osteophytes. This contributes to multilevel mild spinal canal stenosis and neural foraminal narrowing. Upper chest: Negative. Other: None CT ORBITS FINDINGS Orbits: The orbits and their contents are within normal limits. No definitive orbital wall fracture is noted. No other fractures are noted. Visualized sinuses: Mucosal thickening is noted within the left maxillary antrum. Soft tissues: Left frontal soft tissue hematoma is noted consistent with the recent injury. IMPRESSION: CT of the head:  No acute intracranial abnormality noted. Atrophic changes. Left frontal soft tissue hematoma. CT orbits: No acute abnormality of the orbits. Left maxillary mucosal thickening. Left frontal soft tissue hematoma. CT of cervical spine: Multilevel degenerative change without acute abnormality. Electronically Signed   By: Inez Catalina M.D.   On: 08/17/2016 12:34   Ct  Orbits Wo Contrast  Result Date: 08/17/2016 CLINICAL DATA:  Found unresponsive with left orbital hematoma EXAM: CT HEAD WITHOUT CONTRAST CT CERVICAL SPINE WITHOUT CONTRAST CT ORBITS WITHOUT CONTRAST TECHNIQUE: Multidetector CT imaging of the head, orbits and cervical spine was performed following the standard protocol without intravenous contrast. Multiplanar CT image reconstructions were also generated. COMPARISON:  None. FINDINGS: CT HEAD FINDINGS Brain: No evidence of acute infarction,  hemorrhage, hydrocephalus, extra-axial collection or mass lesion/mass effect. Mild atrophic changes are noted. Vascular: No hyperdense vessel or unexpected calcification. Skull: Normal. Negative for fracture or focal lesion. Sinuses/Orbits: Mucosal thickening is noted within the left maxillary antrum. Other: Soft tissue swelling is noted over the left frontal region with a small hematoma measuring 2.6 cm in greatest dimension. CT CERVICAL SPINE FINDINGS Alignment: Normal. Skull base and vertebrae: 7 cervical segments are well visualized. Vertebral body height is well maintained. Facet hypertrophic changes are noted. No acute fracture is seen. Soft tissues and spinal canal: No prevertebral fluid or swelling. No visible canal hematoma. Disc levels: Disc space narrowing is noted at C3-4, C4-5, C5-6 and C6-7 with associated anterior and posterior osteophytes. This contributes to multilevel mild spinal canal stenosis and neural foraminal narrowing. Upper chest: Negative. Other: None CT ORBITS FINDINGS Orbits: The orbits and their contents are within normal limits. No definitive orbital wall fracture is noted. No other fractures are noted. Visualized sinuses: Mucosal thickening is noted within the left maxillary antrum. Soft tissues: Left frontal soft tissue hematoma is noted consistent with the recent injury. IMPRESSION: CT of the head:  No acute intracranial abnormality noted. Atrophic changes. Left frontal soft tissue hematoma. CT orbits: No acute abnormality of the orbits. Left maxillary mucosal thickening. Left frontal soft tissue hematoma. CT of cervical spine: Multilevel degenerative change without acute abnormality. Electronically Signed   By: Inez Catalina M.D.   On: 08/17/2016 12:34      Subjective: Patient is resting comfortably in bed and in no distress. He states that he is feeling well, except for his left eye that continues to be sore.  Denies chest pain, SOB, abdominal pain, nausea,  vomiting.  Discharge Exam: Vitals:   08/19/16 0912 08/19/16 1133  BP: (!) 150/79 (!) 147/68  Pulse: 78 60  Resp: 18 16  Temp:     Vitals:   08/18/16 2107 08/19/16 0513 08/19/16 0912 08/19/16 1133  BP: (!) 148/65 (!) 164/74 (!) 150/79 (!) 147/68  Pulse: 71 65 78 60  Resp: _0 Temp: 99.1 F (37.3 C) 98.3 F (36.8 C)    TempSrc: Oral Oral    SpO2: 99% 100% 98% 97%  Weight:      Height:        General: Pt is alert, awake, not in acute distress Cardiovascular: RRR, S1/S2 +, no rubs, no gallops Respiratory: CTA bilaterally, no wheezing, no rhonchi Abdominal: Soft, NT, ND, bowel sounds + Extremities: no edema, no cyanosis    The results of significant diagnostics from this hospitalization (including imaging, microbiology, ancillary and laboratory) are listed below for reference.     Microbiology: Recent Results (from the past 240 hour(s))  MRSA PCR Screening     Status: None   Collection Time: 08/18/16  3:27 PM  Result Value Ref Range Status   MRSA by PCR NEGATIVE NEGATIVE Final    Comment:        The GeneXpert MRSA Assay (FDA approved for NASAL specimens only), is one component of a comprehensive MRSA colonization surveillance program. It  is not intended to diagnose MRSA infection nor to guide or monitor treatment for MRSA infections.      Labs: BNP (last 3 results) No results for input(s): BNP in the last 8760 hours. Basic Metabolic Panel:  Recent Labs Lab 08/17/16 1034 08/18/16 0607 08/19/16 0906  NA 141 140 138  K 4.3 4.2 4.2  CL 113* 114* 108  CO2 19* 20* 21*  GLUCOSE 118* 116* 281*  BUN 36* 28* 17  CREATININE 2.01* 1.51* 1.12  CALCIUM 9.0 8.1* 9.3   Liver Function Tests:  Recent Labs Lab 08/17/16 1034 08/18/16 0607  AST 49* 28  ALT 22 17  ALKPHOS 173* 129*  BILITOT 0.4 0.6  PROT 7.3 5.8*  ALBUMIN 3.5 2.7*   No results for input(s): LIPASE, AMYLASE in the last 168 hours. No results for input(s): AMMONIA in the last 168  hours. CBC:  Recent Labs Lab 08/17/16 1034 08/18/16 0607  WBC 14.9* 6.7  NEUTROABS 12.9*  --   HGB 11.4* 9.2*  HCT 34.7* 28.7*  MCV 93.3 93.2  PLT 244 208   Cardiac Enzymes:  Recent Labs Lab 08/17/16 1034 08/18/16 0607 08/19/16 0906  CKTOTAL 1,412* 834* 779*   BNP: Invalid input(s): POCBNP CBG:  Recent Labs Lab 08/18/16 0738 08/18/16 1156 08/18/16 1658 08/18/16 2115 08/19/16 0753  GLUCAP 92 242* 187* 251* 258*   D-Dimer No results for input(s): DDIMER in the last 72 hours. Hgb A1c  Recent Labs  08/17/16 1456  HGBA1C 8.9*   Lipid Profile No results for input(s): CHOL, HDL, LDLCALC, TRIG, CHOLHDL, LDLDIRECT in the last 72 hours. Thyroid function studies  Recent Labs  08/18/16 1107  TSH 0.275*   Anemia work up No results for input(s): VITAMINB12, FOLATE, FERRITIN, TIBC, IRON, RETICCTPCT in the last 72 hours. Urinalysis    Component Value Date/Time   COLORURINE YELLOW 08/17/2016 1108   APPEARANCEUR CLEAR 08/17/2016 1108   LABSPEC 1.011 08/17/2016 1108   PHURINE 5.0 08/17/2016 1108   GLUCOSEU 250 (A) 08/17/2016 1108   HGBUR LARGE (A) 08/17/2016 1108   BILIRUBINUR NEGATIVE 08/17/2016 1108   KETONESUR NEGATIVE 08/17/2016 1108   PROTEINUR 100 (A) 08/17/2016 1108   UROBILINOGEN 0.2 08/21/2015 2154   NITRITE NEGATIVE 08/17/2016 1108   LEUKOCYTESUR NEGATIVE 08/17/2016 1108   Sepsis Labs Invalid input(s): PROCALCITONIN,  WBC,  LACTICIDVEN Microbiology Recent Results (from the past 240 hour(s))  MRSA PCR Screening     Status: None   Collection Time: 08/18/16  3:27 PM  Result Value Ref Range Status   MRSA by PCR NEGATIVE NEGATIVE Final    Comment:        The GeneXpert MRSA Assay (FDA approved for NASAL specimens only), is one component of a comprehensive MRSA colonization surveillance program. It is not intended to diagnose MRSA infection nor to guide or monitor treatment for MRSA infections.      Time coordinating discharge: 30  minutes  SIGNED:  Marzetta Board, MD, PhD [facility] 08/19/2016, 11:48 AM Pager [phone]  If 7PM-7AM, please contact night-coverage [email or web address] Password [email or web address]

## 2016-08-19 NOTE — Care Management Note (Addendum)
Case Management Note  Patient Details  Name: [name] [name] MRN: [id number] Date of Birth: [birth year]  Subjective/Objective:  Presents with hypogylcemia, history of chronic kidney disease stage III, diabetes. From home alone with supportive family. Daughter Davy Pique states family will provide assistance for dad if needed @ d/c.                 Davy Pique( daughter) [phone]  PCP: August Saucer  Action/Plan:  Plan is to d/c to home today with home health services.  Expected Discharge Date:  08/19/16               Expected Discharge Plan:  [place]  In-House Referral:   CSW/ Pt refuses SNF: PT'S recommendation.  Discharge planning Services  CM Consult  Post Acute Care Choice:    Choice offered to:  Patient  DME Arranged:    DME Agency:     HH Arranged:  PT, RN [facility] Agency:  [employer]/ CM made referral with Butch Penny @ [phone]  Status of Service:  Completed, signed off  If discussed at [place] of Stay Meetings, dates discussed:    Additional Comments:  Sharin Mons, RN 08/19/2016, 10:37 AM

## 2016-08-19 NOTE — Progress Notes (Signed)
Discharge papers and prescriptions take into room to review with patient, patient wanted to wait for daughter to go over them. Staff called for nurse when daughter got here, when nurse got to room patient had already left and taken discharge folder without signing paperwork.  [name] [name], Tivis Ringer, RN

## 2016-08-19 NOTE — Progress Notes (Signed)
Spoke briefly with patient regarding home medications and insulin.  He states that he takes 2 times daily but unsure of type.  Discussed Lantus and Novolog with patient and that changes are being made to his insulin doses.  He states he uses insulin pens and has meter to check blood sugars. Per RN he will have home health also.  Discussed importance of following new instructions for insulin and checking blood sugars.  He states that his daughter will also help him.  Discussed with RN the importance of including family in d/c instructions including insulin.  Thanks, Adah Perl, RN, BC-ADM Inpatient Diabetes Coordinator Pager [phone] (8a-5p)

## 2016-08-21 ENCOUNTER — Telehealth: Payer: Self-pay | Admitting: *Deleted

## 2016-08-21 NOTE — Telephone Encounter (Signed)
Transition Care Management Follow-up Telephone Call   Date discharged? 08/19/16   How have you been since you were released from the hospital? Pt states he is doing alright   Do you understand why you were in the hospital? YES   Do you understand the discharge instructions? YES   Where were you discharged to? Home   Items Reviewed:  Medications reviewed: YES  Allergies reviewed: YES  Dietary changes reviewed: YES  Referrals reviewed: No referral needed   Functional Questionnaire:   Activities of Daily Living (ADLs):   He states he are independent in the following: ambulation, bathing and hygiene, feeding, continence, grooming, toileting and dressing States he doesn't require assistance   Any transportation issues/concerns?: YES   Any patient concerns? YES   Confirmed importance and date/time of follow-up visits scheduled YES, appt 08/26/16  Provider Appointment booked with Terri Piedra, NP  Confirmed with patient if condition begins to worsen call PCP or go to the ER.  Patient was given the office number and encouraged to call back with question or concerns.  : YES

## 2016-08-22 ENCOUNTER — Telehealth: Payer: Self-pay | Admitting: Family

## 2016-08-22 NOTE — Telephone Encounter (Signed)
I think patient should keep appt with Marya Amsler. I will not be taking over his care. Thank you

## 2016-08-22 NOTE — Telephone Encounter (Signed)
Routing to charlotte----please advise, thanks 

## 2016-08-22 NOTE — Telephone Encounter (Signed)
Spoke with home health nurse and pt is refusing any type of services under gregs name. States that he and Marya Amsler had a falling out at last visit which Marya Amsler states was not the case. He just advised pt to go to orthopedics for pain management bc he was not doing pts pain management. Advised that pt switch PCPs. Home health nurse was going to make pts sister aware of what was going on.

## 2016-08-22 NOTE — Telephone Encounter (Signed)
Sister called in regard for patient.  Would like to know if Baldo Ash can take patient on and if she can work patient in soon for home health care orders? Sister's name is [name] and [phone].   Sister also states she wants to talk with patient again to see what he wants to do and hold onto appt with Marya Amsler for right now until we hear from [place] and patients final decision.

## 2016-08-22 NOTE — Telephone Encounter (Signed)
Please call Cindy back.  She is with patient now.  Patient states he had a fallin out with Marya Amsler and Marya Amsler sent him to another provider.  Patient is requesting orders to come from that other provider.  Patient does not know the name of the other provider Greg sent him to.

## 2016-08-25 NOTE — Telephone Encounter (Signed)
Talked with [name] [name], patient's sister and gave charlottes instructions---she will let patient know---[name] [name] is not sure if patient will be happy staying with greg and keeping appt with greg tomorrow---I did advise to let us know today to avoid cancellation fee at last minute---can talk with tamara if any questions

## 2016-08-26 ENCOUNTER — Ambulatory Visit (INDEPENDENT_AMBULATORY_CARE_PROVIDER_SITE_OTHER): Payer: Medicare HMO | Admitting: Family

## 2016-08-26 ENCOUNTER — Encounter: Payer: Self-pay | Admitting: Family

## 2016-08-26 VITALS — BP 160/72 | HR 62 | Temp 100.1°F | Resp 16 | Ht 69.0 in | Wt 142.0 lb

## 2016-08-26 DIAGNOSIS — E1121 Type 2 diabetes mellitus with diabetic nephropathy: Secondary | ICD-10-CM

## 2016-08-26 DIAGNOSIS — Z794 Long term (current) use of insulin: Secondary | ICD-10-CM

## 2016-08-26 DIAGNOSIS — S72142D Displaced intertrochanteric fracture of left femur, subsequent encounter for closed fracture with routine healing: Secondary | ICD-10-CM | POA: Diagnosis not present

## 2016-08-26 DIAGNOSIS — E1165 Type 2 diabetes mellitus with hyperglycemia: Secondary | ICD-10-CM | POA: Diagnosis not present

## 2016-08-26 DIAGNOSIS — IMO0002 Reserved for concepts with insufficient information to code with codable children: Secondary | ICD-10-CM

## 2016-08-26 LAB — GLUCOSE, POCT (MANUAL RESULT ENTRY): POC Glucose: 328 mg/dl — AB (ref 70–99)

## 2016-08-26 MED ORDER — INSULIN GLARGINE 100 UNIT/ML ~~LOC~~ SOLN: 10.0000 [IU] | Freq: Every day | SUBCUTANEOUS | 2 refills | Status: DC

## 2016-08-26 NOTE — Progress Notes (Signed)
Subjective:    Patient ID: [name] [name], male    DOB: 1946-04-14, 70 y.o.   MRN: [id number]  Chief Complaint  Patient presents with  . Hospitalization Follow-up    had a fall at home here fore a follow up, rib pain    HPI:  [name] [name] is a 70 y.o. 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 and presents today    Recently evaluated in the emergency department and admitted to the hospital after being found unresponsive on the floor by his daughter and noted to have hypoglycemia by EMS with a blood sugar of 37. He was also noted to have left eye ecchymosis. CT scan did not show any acute intracranial or orbital abnormalities. He was diagnosed with hypoglycemia and treated with IV dextrose and monitored overnight with no further episodes of hypoglycemia. Lantus was decreased to 5 units daily. There is questionable patient understanding about medication administration. He is discharged with home health RN and physical therapy. All ED/hospital records reviewed in detail including labs, records, and imaging.  Since leaving the hospital he reports that he is doing fair. His family has been trying to keep up with him. Blood sugars are averaging in the 200-300's. Denies any hunger, thirst or urination. Reports taking Lantus 5 units daily as prescribed without adverse side effects with no hypoglycemic readings. Indicates that Kim may have been by the house, but has not started physical therapy. He does continue to have pain on the left side. Reports  continue to take his medications as presribed. He continues to ambulate with walker.    No Known Allergies    Outpatient Medications Prior to Visit  Medication Sig Dispense Refill  . acetaminophen (TYLENOL) 325 MG tablet Take 2 tablets (650 mg total) by mouth every 6 (six) hours as needed for mild pain (or Fever >/= 101). 30 tablet 0  . amLODipine (NORVASC) 10 MG tablet Take 1 tablet (10 mg total) by mouth daily. 30 tablet 0  . atorvastatin (LIPITOR) 10 MG tablet Take 1 tablet (10 mg total) by mouth daily at 6 PM. 30 tablet 0  . blood glucose meter kit and supplies Use daily to check blood sugar. DX: E11.09 1 each 0  . carvedilol (COREG) 6.25 MG tablet Take 1 tablet (6.25 mg total) by mouth 2 (two) times daily with a meal. 60 tablet 0  . cyanocobalamin (,VITAMIN B-12,) 1000 MCG/ML injection Inject 1 mL (1,000 mcg total) into the muscle every 30 (thirty) days. (Patient taking differently: Inject 1,000 mcg into the muscle every Monday. ) 1 mL 0  . feeding supplement, GLUCERNA SHAKE, (GLUCERNA SHAKE) LIQD Take 237 mLs by mouth 2 (two) times daily between meals. (Patient not taking: Reported on 08/17/2016) 237 mL 0  . glucose blood (COOL BLOOD GLUCOSE TEST STRIPS) test strip Use as instructed to check blood sugar up to 3 times a day. DX: E11.09 300 each 1  . HYDROcodone-acetaminophen (NORCO/VICODIN) 5-325 MG tablet Take 1 tablet by mouth every 4 (four) hours as needed for severe pain. 12 tablet 0  . insulin aspart (NOVOLOG FLEXPEN) 100 UNIT/ML FlexPen Inject 0-12 Units into the skin 3 (three) times daily  with meals. Per sliding scale, 0-150 = 0 units, 151-200 = 2 units, 201-250 = 4 units, 251-300 = 6 units, 301-350 = 8 units, 351-400 = 10 units, 401-500 = 12 units (call MD) 15 mL 2  . Lancets MISC Use as instructed to check blood sugar up to 3 times a day. DX: E11.09 300 each 1  . polyethylene glycol powder (GLYCOLAX/MIRALAX) powder Take 17 g by mouth every morning.     . rivaroxaban (XARELTO) 20 MG  TABS tablet Take 1 tablet (20 mg total) by mouth daily with supper. STARTING October 16. 30 tablet   . senna (SENOKOT) 8.6 MG TABS tablet Take 1 tablet (8.6 mg total) by mouth 2 (two) times daily. (Patient not taking: Reported on 08/17/2016) 120 each 0  . thiamine 100 MG tablet Take 1 tablet (100 mg total) by mouth daily. 30 tablet 0  . insulin aspart (NOVOLOG) 100 UNIT/ML FlexPen Inject 2 Units into the skin 3 (three) times daily with meals. 15 mL 0  . insulin glargine (LANTUS) 100 UNIT/ML injection Inject 0.05 mLs (5 Units total) into the skin daily. 10 mL 2   No facility-administered medications prior to visit.       Past Surgical History:  Procedure Laterality Date  . HIP ARTHROPLASTY Right 02/05/2013   Procedure: ARTHROPLASTY BIPOLAR HIP;  Surgeon: [name], MD;  Location: [facility];  Service: Orthopedics;  Laterality: Right;  . INTRAMEDULLARY (IM) NAIL INTERTROCHANTERIC Left 06/16/2016   Procedure: INTRAMEDULLARY (IM) NAIL INTERTROCHANTRIC;  Surgeon: Rod Can, MD;  Location: [facility];  Service: Orthopedics;  Laterality: Left;      Past Medical History:  Diagnosis Date  . 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 Closed comminuted intertrochanteric fracture of left femur ([place])   . Diabetes mellitus without complication ([place])   . Diabetic hyperosmolar non-ketotic state ([place]) 05/15/2016  . DKA (diabetic ketoacidoses) ([place]) 05/09/2016  . Hypertension   . Postoperative anemia due to acute blood loss 06/18/2016  . Retroperitoneal hematoma 06/18/2016  . Vitamin B 12 deficiency 06/18/2016      Review of Systems  Eyes:       Negative for changes in vision.  Respiratory: Negative for chest tightness and shortness of breath.   Cardiovascular: Negative for chest pain, palpitations and leg swelling.  Endocrine: Negative for polydipsia, polyphagia and polyuria.  Neurological: Negative for dizziness, weakness, light-headedness and  headaches.      Objective:    BP (!) 160/72 (BP Location: Left Arm, Patient Position: Sitting, Cuff Size: Normal)   Pulse 62   Temp 100.1 F (37.8 C) (Oral)   Resp 16   Ht '5\' 9"'  (1.753 m)   Wt 142 lb (64.4 kg)   SpO2 97%   BMI 20.97 kg/m  Nursing note and vital signs reviewed.  Physical Exam  Constitutional: He is oriented to person, place, and time. He appears well-developed and well-nourished. No distress.  Cardiovascular: Normal rate, regular rhythm, normal heart sounds and intact distal pulses.   Pulmonary/Chest: Effort normal and breath sounds normal.  Musculoskeletal:  Left lower extremity - no obvious deformity, discoloration, or edema. There is tenderness elicited over the anterior thigh and knee. Range of motion is restricted in flexion and a week in all directions. Extension appears intact with some discomfort. Distal pulses and sensation are intact and appropriate.  Neurological: He is alert and oriented to person, place, and time.  Skin: Skin is warm and dry.  Psychiatric: He  has a normal mood and affect. His behavior is normal. Judgment and thought content normal.       Assessment & Plan:   Problem List Items Addressed This Visit      Endocrine   Uncontrolled type 2 diabetes mellitus with diabetic nephropathy, with long-term current use of insulin ([name]) - Primary (Chronic)    Blood sugars continue to be labile upon leaving the hospital following episode of hypoglycemia with blood sugars averaging in the 200 to 300s and 328 through in office point-of-care glucose testing. Discussed importance of taking medication as prescribed and reviewed patient's medication regimen. He will increase Lantus to 10 units daily and follow-up in one week with blood sugar readings to determine continued titration need. Patient verbally acknowledges instructions and will continue to monitor.      Relevant Medications   insulin glargine (LANTUS) 100 UNIT/ML injection   Other Relevant  Orders   POCT glucose (manual entry) (Completed)     Musculoskeletal and Integument   Closed comminuted intertrochanteric fracture of left femur ([name])    Ambulating with walker and continues to experience weakness located in his left lower extremity compared to the right with decreased range of motion, strength, and endurance. Recommend continued work with physical therapy to improve strength, endurance, imbalance. Does continue to experience a moderate level of pain. Discussed pain management with patient including nonpharmacological therapies including ice and exercises. Continue current dosage of Tylenol. Given previous history there is significant concern for opioid usage and potential for misuse/abuse. Patient will continue to monitor pain and follow-up if symptoms remain uncontrolled.          I have changed Mr. Desa insulin glargine. I am also having him maintain his atorvastatin, acetaminophen, feeding supplement (GLUCERNA SHAKE), amLODipine, Lancets, thiamine, blood glucose meter kit and supplies, glucose blood, carvedilol, senna, cyanocobalamin, polyethylene glycol powder, rivaroxaban, HYDROcodone-acetaminophen, and insulin aspart.   Meds ordered this encounter  Medications  . insulin glargine (LANTUS) 100 UNIT/ML injection    Sig: Inject 0.1 mLs (10 Units total) into the skin daily.    Dispense:  10 mL    Refill:  2    Order Specific Question:   Supervising Provider    Answer:   Pricilla Holm A [[id number]]     Follow-up: Return in about 1 week (around 09/02/2016), or if symptoms worsen or fail to improve.  Mauricio Po, [name]

## 2016-08-26 NOTE — Assessment & Plan Note (Signed)
Blood sugars continue to be labile upon leaving the hospital following episode of hypoglycemia with blood sugars averaging in the 200 to 300s and 328 through in office point-of-care glucose testing. Discussed importance of taking medication as prescribed and reviewed patient's medication regimen. He will increase Lantus to 10 units daily and follow-up in one week with blood sugar readings to determine continued titration need. Patient verbally acknowledges instructions and will continue to monitor.

## 2016-08-26 NOTE — Assessment & Plan Note (Addendum)
Ambulating with walker and continues to experience weakness located in his left lower extremity compared to the right with decreased range of motion, strength, and endurance. Recommend continued work with physical therapy to improve strength, endurance, imbalance. Does continue to experience a moderate level of pain. Discussed pain management with patient including nonpharmacological therapies including ice and exercises. Continue current dosage of Tylenol. Given previous history there is significant concern for opioid usage and potential for misuse/abuse. Patient will continue to monitor pain and follow-up if symptoms remain uncontrolled.

## 2016-08-26 NOTE — Patient Instructions (Signed)
Thank you for choosing [facility].  SUMMARY AND INSTRUCTIONS:  Ice x 20 minutes to your thigh and ribs every 2 hours and as needed following activity.  Exercises including lifting your hip up, in every few couple of hours.   Continue to work with physical therapy and we will check on the start.  Tylenol as needed for pain - NO MORE THAN 9 PER DAY.   Increase your Lantus to 10 units and continue to monitor your blood sugars.  Please let us know your blood sugars in about 1 week for adjustment.  Medication:  Your prescription(s) have been submitted to your pharmacy or been printed and provided for you. Please take as directed and contact our office if you believe you are having problem(s) with the medication(s) or have any questions.  Labs:  Please stop by the lab on the lower level of the building for your blood work. Your results will be released to [place] (or called to you) after review, usually within 72 hours after test completion. If any changes need to be made, you will be notified at that same time.  1.) The lab is open from 7:30am to 5:30 pm Monday-Friday 2.) No appointment is necessary 3.) Fasting (if needed) is 6-8 hours after food and drink; black coffee and water are okay   Follow up:  If your symptoms worsen or fail to improve, please contact our office for further instruction, or in case of emergency go directly to the emergency room at the closest medical facility.

## 2016-09-01 ENCOUNTER — Encounter: Payer: Self-pay | Admitting: Endocrinology

## 2016-09-07 IMAGING — CR DG CHEST 2V
2 series · 2 of 2 positions shown · non-contrast
Comparison: 03/20/2015

CLINICAL DATA: Confusion, fever.  Hypertension, diabetes

EXAM:
CHEST  2 VIEW

[w chest lat]
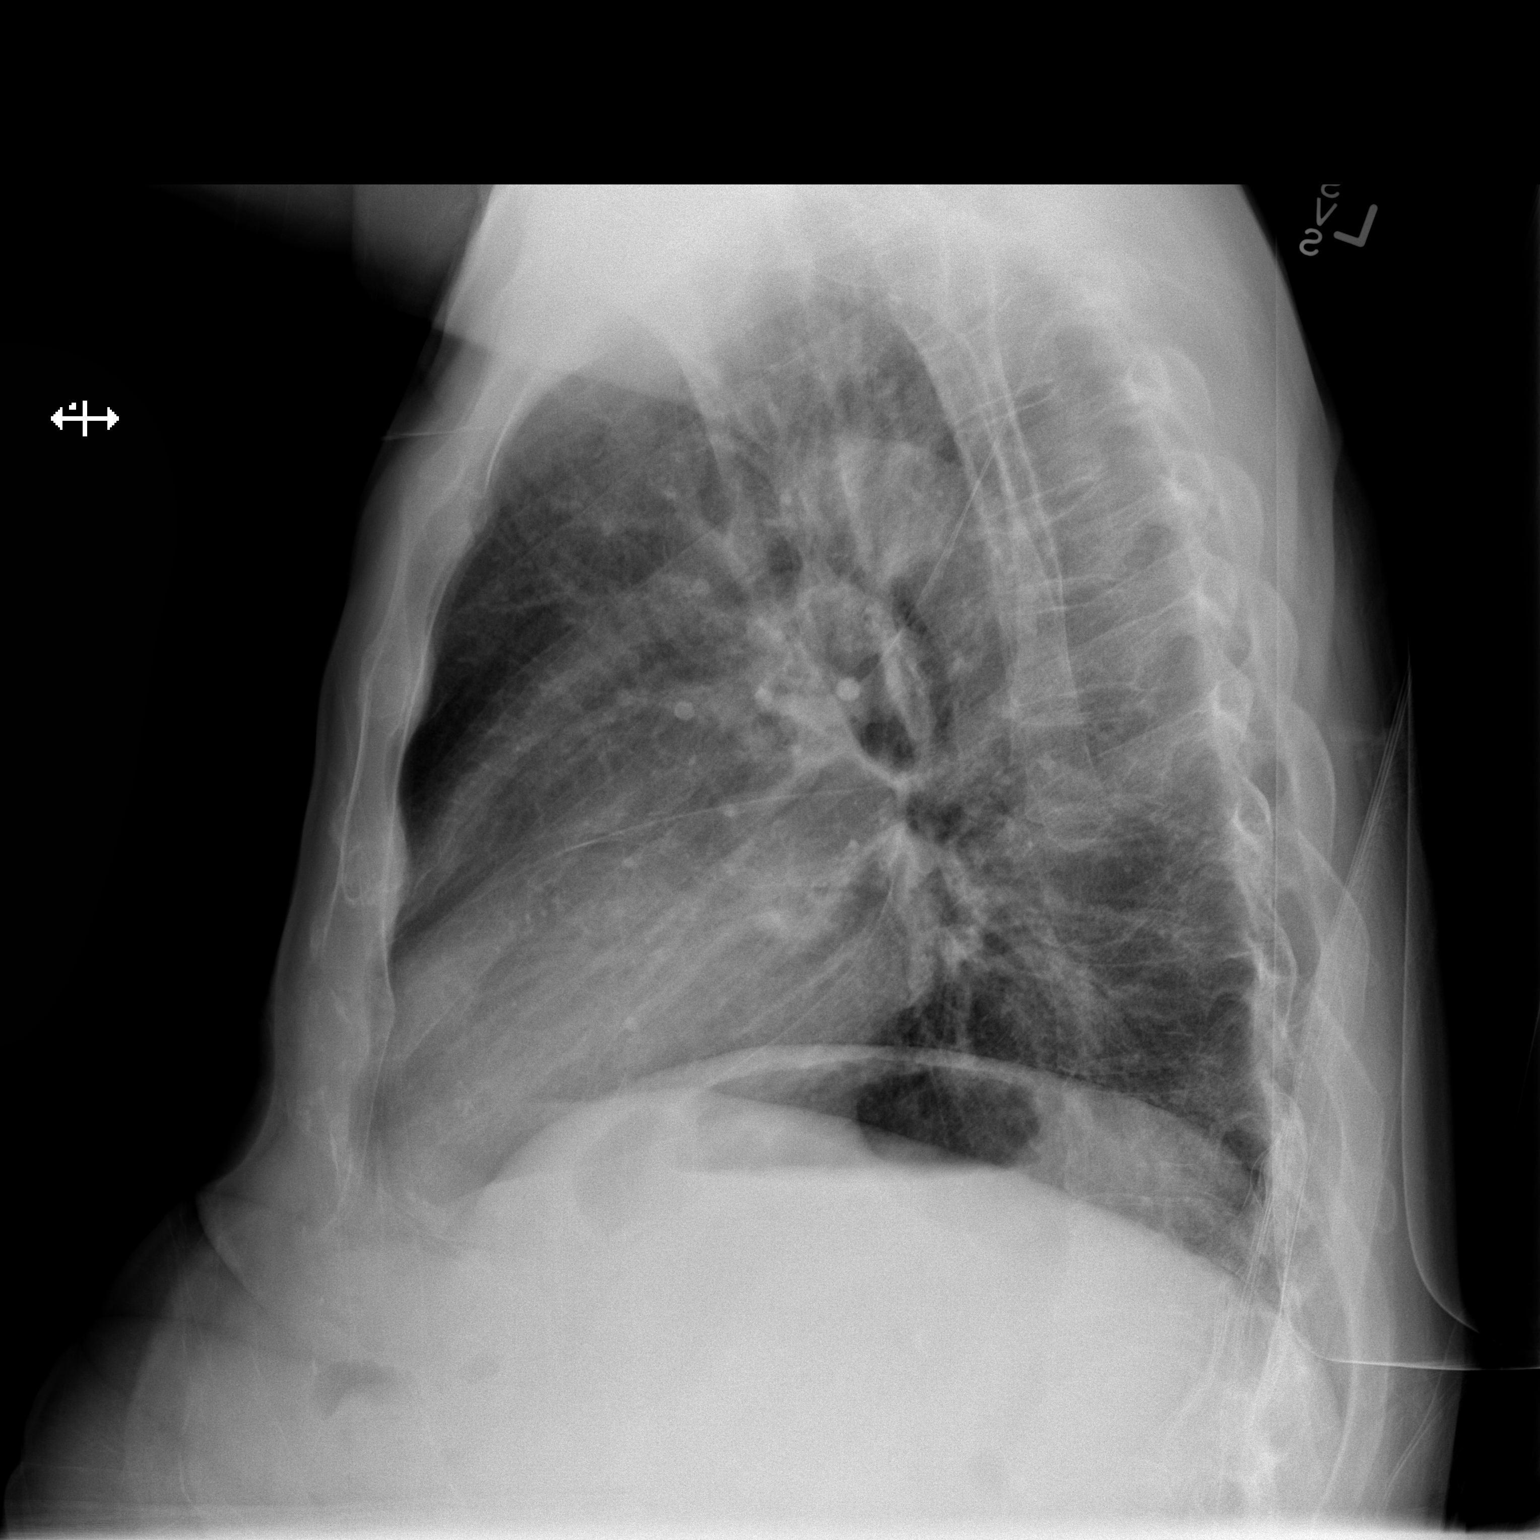

[x chest ap]
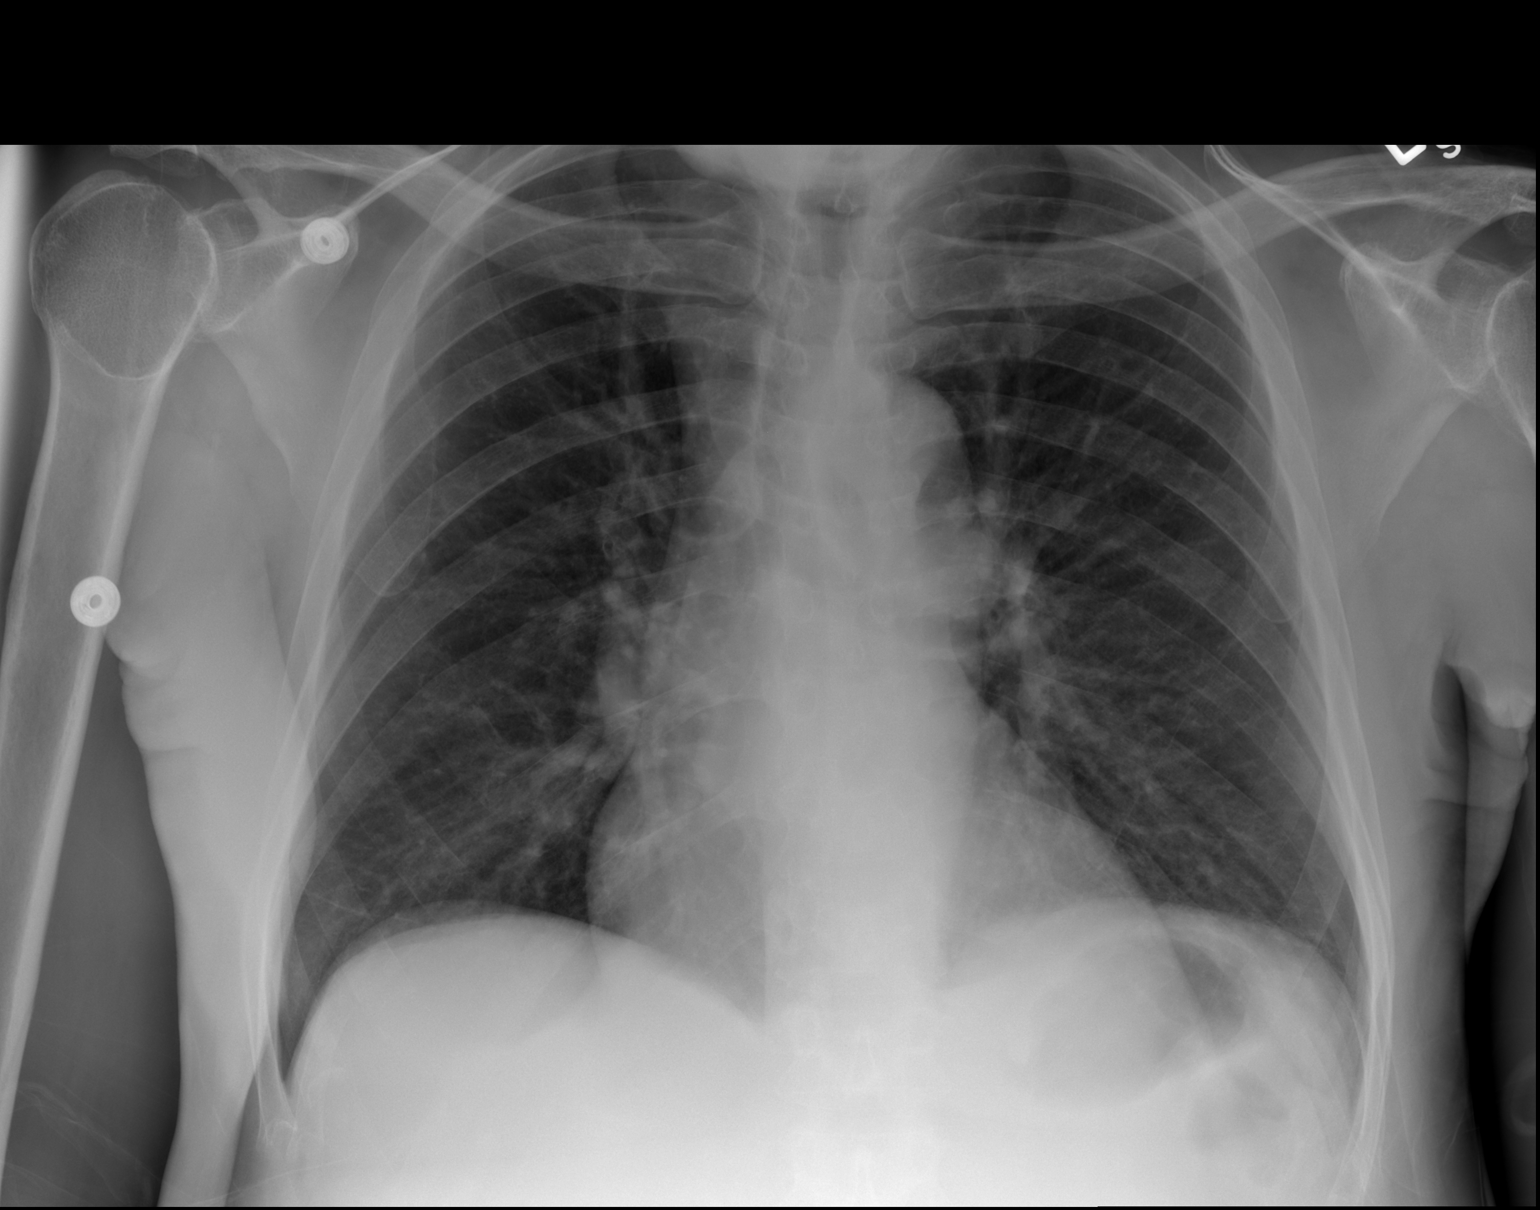

[2 of 2 positions shown; findings below may reference images not displayed]

FINDINGS: The heart size and mediastinal contours are within normal limits.
Both lungs are clear. The visualized skeletal structures are
unremarkable.
IMPRESSION: No active cardiopulmonary disease.

## 2016-09-07 IMAGING — CT CT HEAD W/O CM
2 series · 16 of 30 positions shown, 19 images · non-contrast
Comparison: 03/19/2015

CLINICAL DATA: Confused and disoriented, uncertain what happened to
patient over past week, hallucinations, hypertension, diabetes
mellitus, renal failure

EXAM:
CT HEAD WITHOUT CONTRAST
TECHNIQUE: Contiguous axial images were obtained from the base of the skull
through the vertex without intravenous contrast.

[Series 2: head w/o · axial · non-contrast · 0.40mm/px · z∈[-50,+70]mm · 9 of 30 slices shown, 12 images]
[im 3/30  brain]
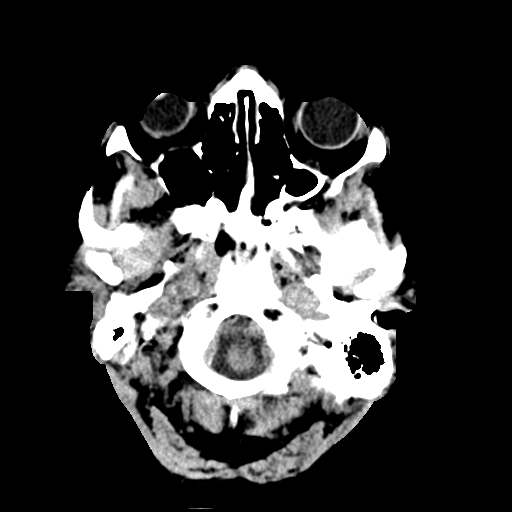
[im 3/30  bone]
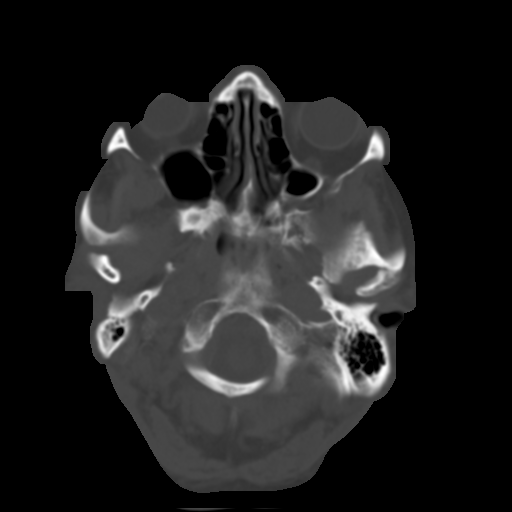
[im 6/30  brain]
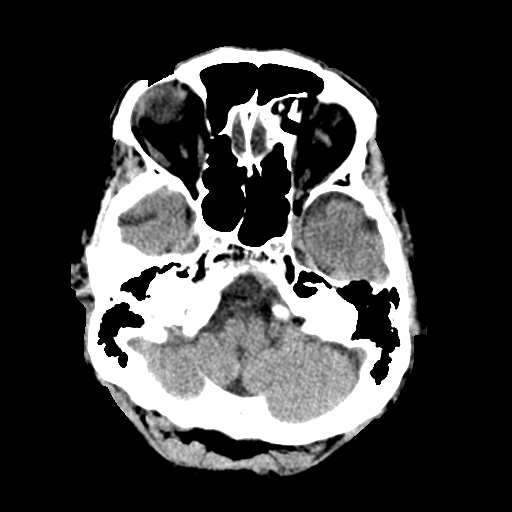
[im 9/30  brain]
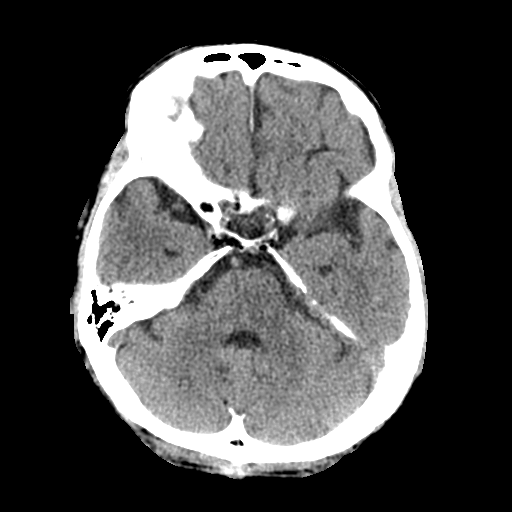
[im 12/30  brain]
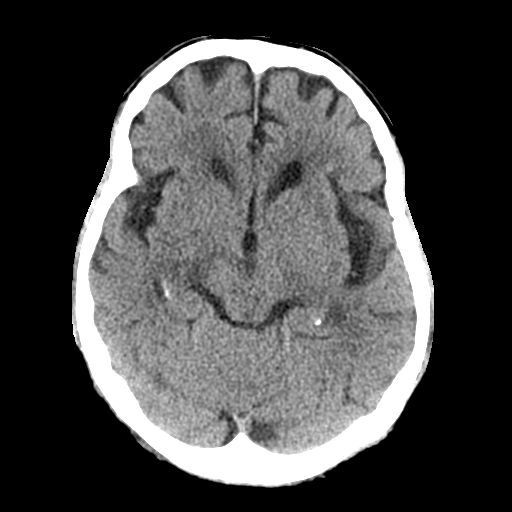
[im 15/30  brain]
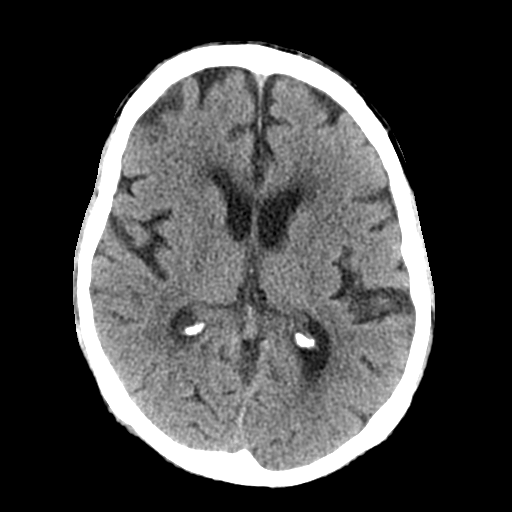
[im 15/30  bone]
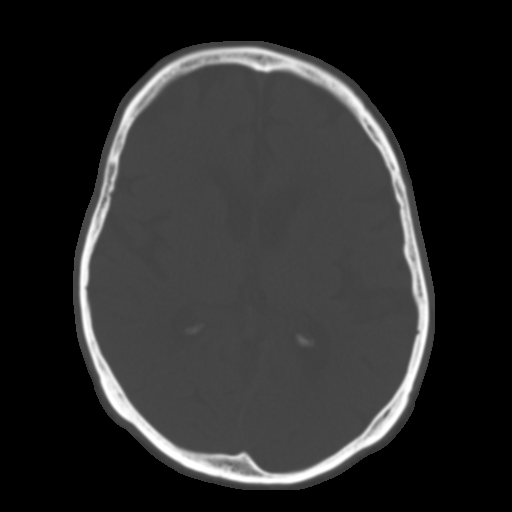
[im 18/30  brain]
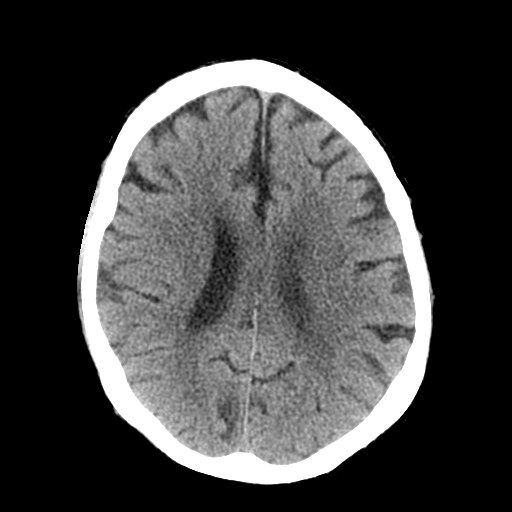
[im 21/30  brain]
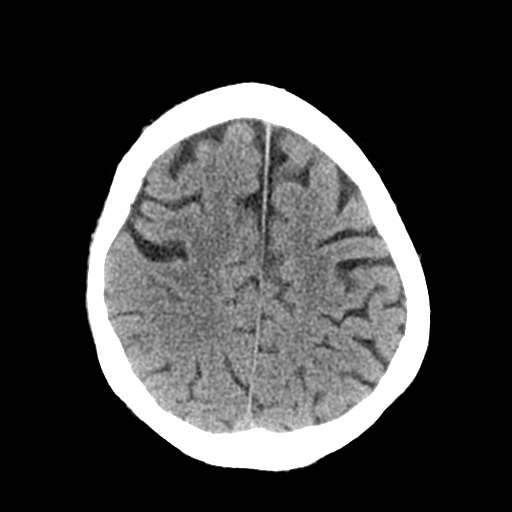
[im 24/30  brain]
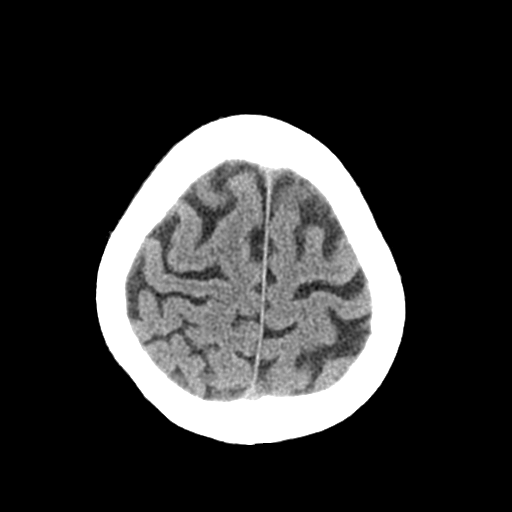
[im 27/30  brain]
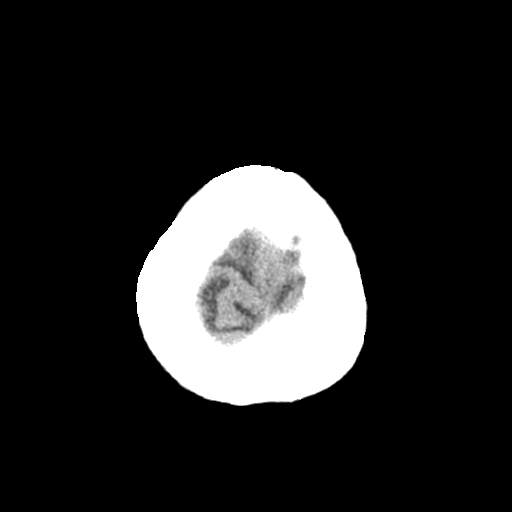
[im 27/30  bone]
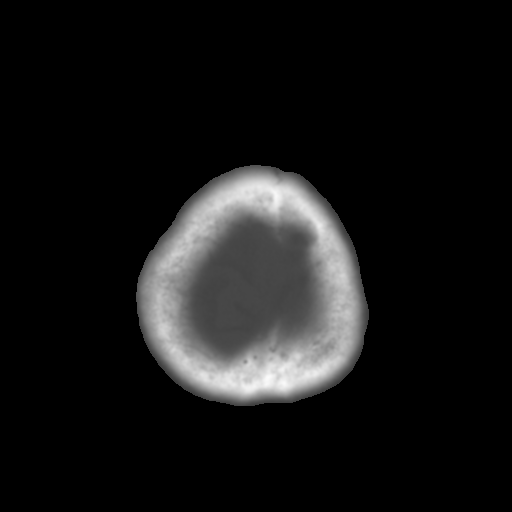

[Series 3: bone windows · axial · 0.40mm/px · z∈[-45,+51]mm · 7 of 49 slices shown]
[im 6/49  bone]
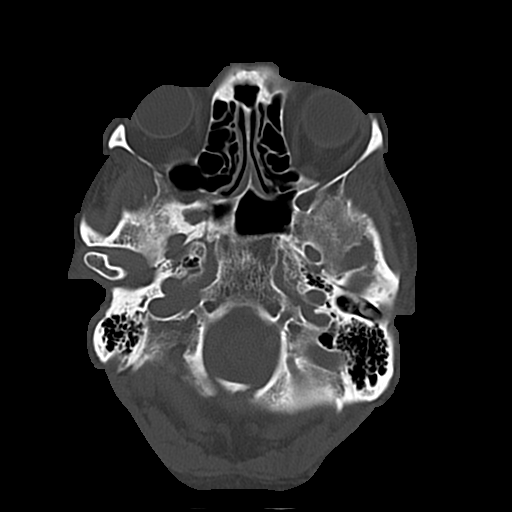
[im 11/49  bone]
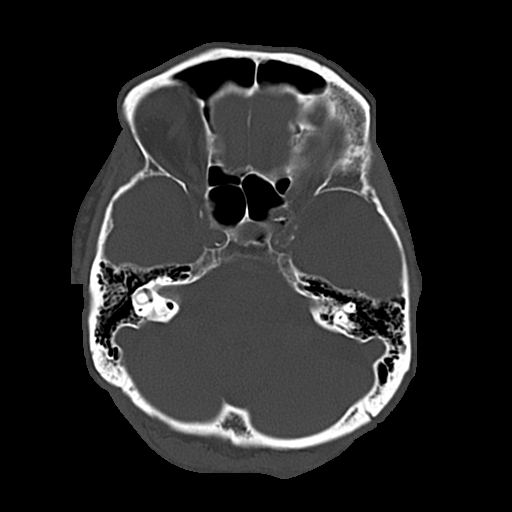
[im 17/49  bone]
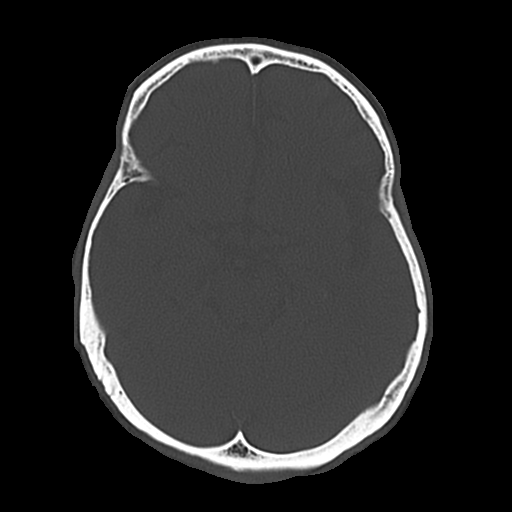
[im 22/49  bone]
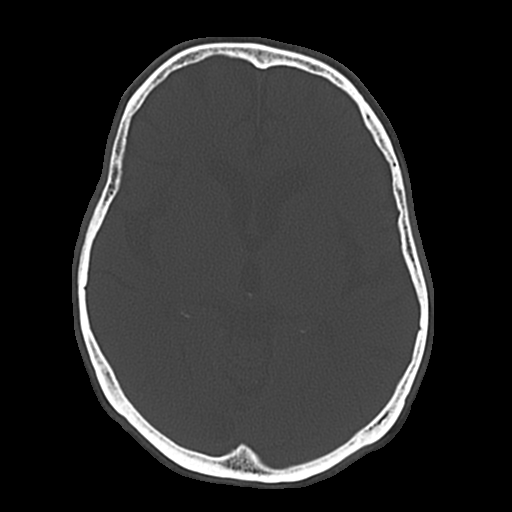
[im 27/49  bone]
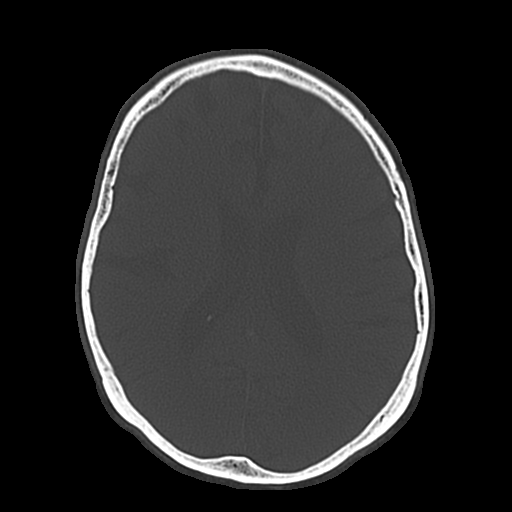
[im 33/49  bone]
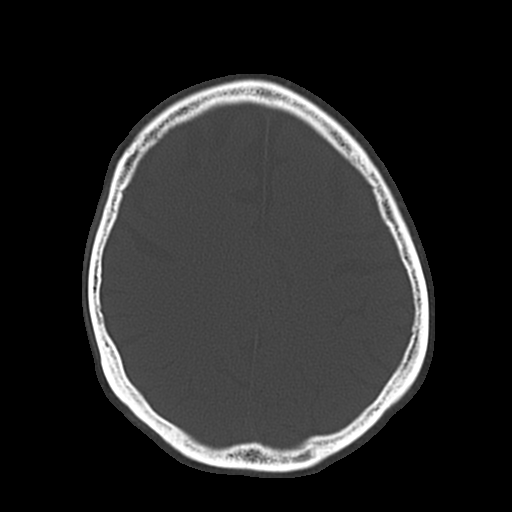
[im 38/49  bone]
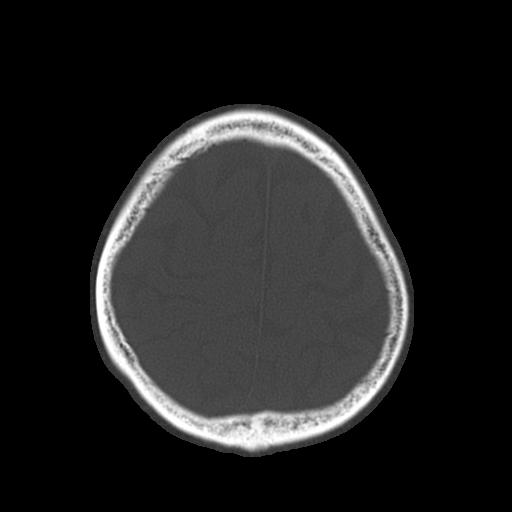

[16 of 30 positions shown; findings below may reference images not displayed]

FINDINGS: Mild generalized atrophy.

Normal ventricular morphology.

No midline shift or mass effect.

Mild small vessel chronic ischemic changes of deep cerebral white
matter.

No intracranial hemorrhage, mass lesion or evidence acute
infarction.

No extra-axial fluid collections.

Small air-fluid level LEFT maxillary sinus.

Atherosclerotic calcifications of the carotid siphons.

Osseous structures, remaining visualized sinuses and mastoid air
cells otherwise unremarkable.
IMPRESSION: Atrophy with small vessel chronic ischemic changes of deep cerebral
white matter.

No acute intracranial abnormalities.

Small chronic air-fluid level in the LEFT maxillary sinus.

## 2016-09-12 IMAGING — MR MR HEAD W/O CM
11 of 14 series · 29 of 48 positions shown · non-contrast
Comparison: Head CT without contrast 07/02/2015. Brain MRI
03/20/2015.

CLINICAL DATA: 69-year-old male with syncope, confusion, altered
mental status, diabetic ketoacidosis. Initial encounter.

EXAM:
MRI HEAD WITHOUT CONTRAST
TECHNIQUE: Multiplanar, multiecho pulse sequences of the brain and surrounding
structures were obtained without intravenous contrast.

[Series 3: FLAIR · sagittal · 5.0mm · 0.47mm/px · 1 of 26 slices shown (1 of 3)]
[im 1/26]
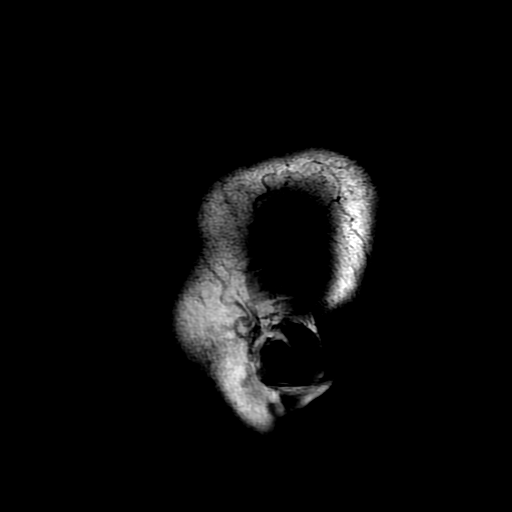

[Series 5: DWI · axial · 3.0mm · 0.94mm/px · z∈[-149,-12]mm · 5 of 100 slices shown (1 of 4)]
[im 1/100]
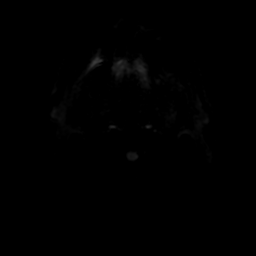
[im 25/100]
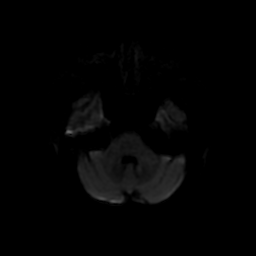
[im 50/100]
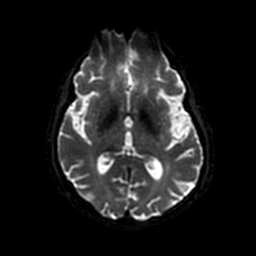
[im 75/100]
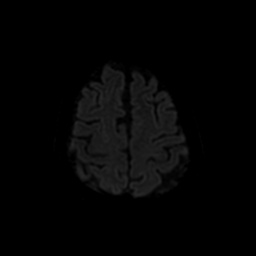
[im 100/100]
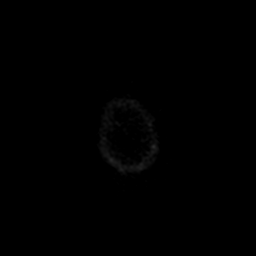

[Series 6: T2 · axial · 5.0mm · 0.47mm/px · 1 of 25 slices shown (1 of 2)]
[im 1/25]
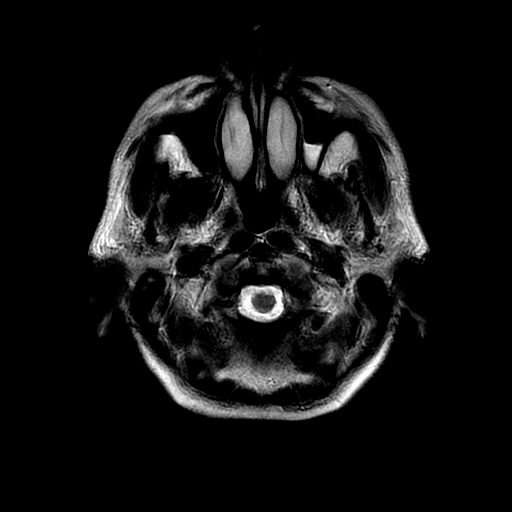

[Series 7: FLAIR · axial · 5.0mm · 0.47mm/px · 1 of 25 slices shown (2 of 3)]
[im 1/25]
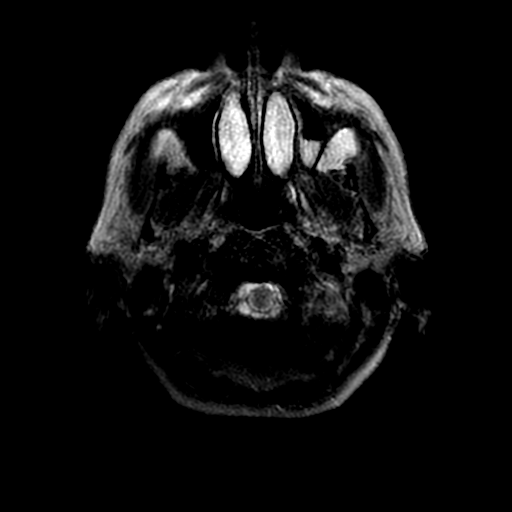

[Series 8: DWI · coronal · 5.0mm · 0.94mm/px · 4 of 72 slices shown (2 of 4)]
[im 1/72]
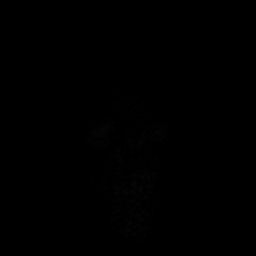
[im 24/72]
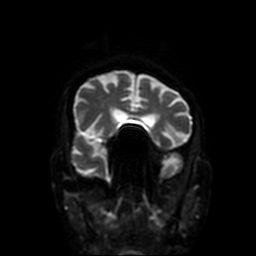
[im 48/72]
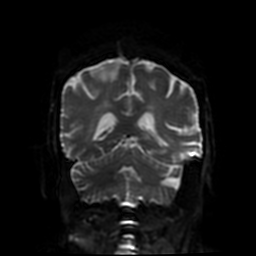
[im 72/72]
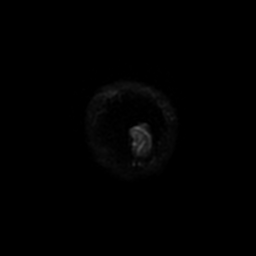

[Series 9: (person_name) · axial · 3.0mm · 0.47mm/px · z∈[-149,-11]mm · 6 of 100 slices shown]
[im 1/100]
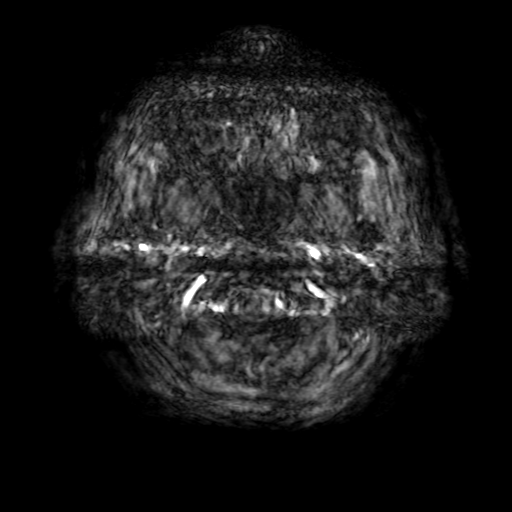
[im 20/100]
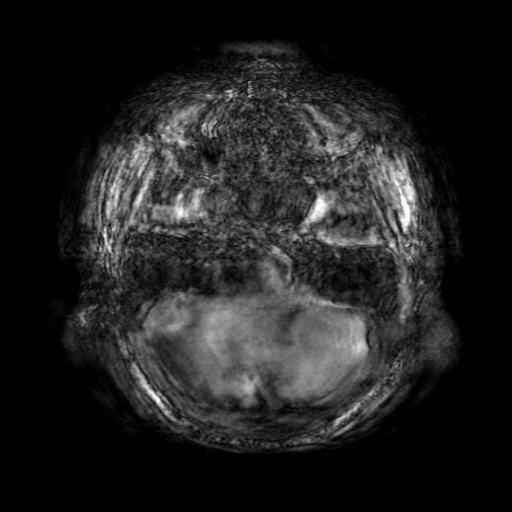
[im 40/100]
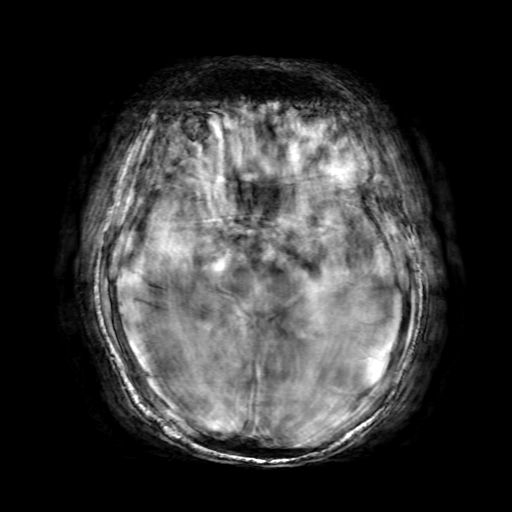
[im 60/100]
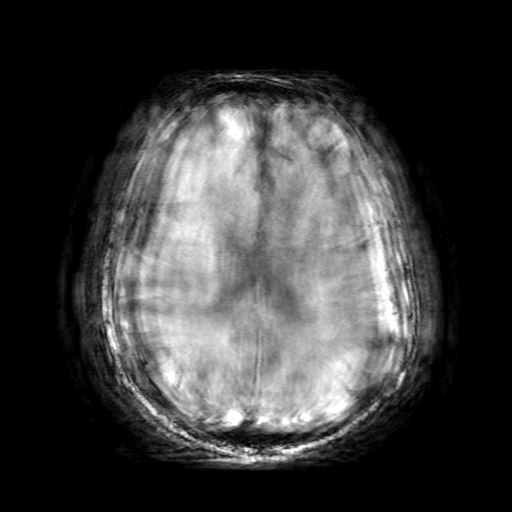
[im 80/100]
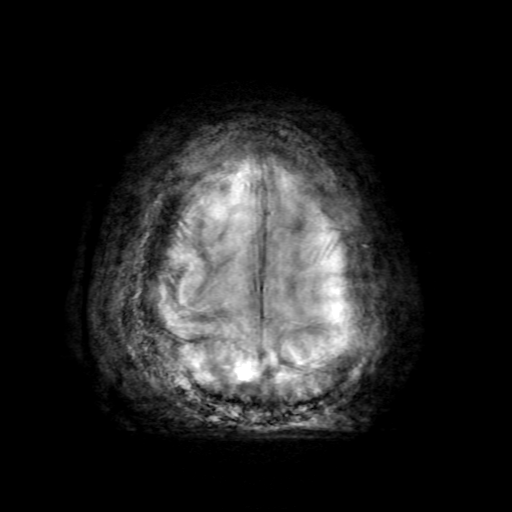
[im 100/100]
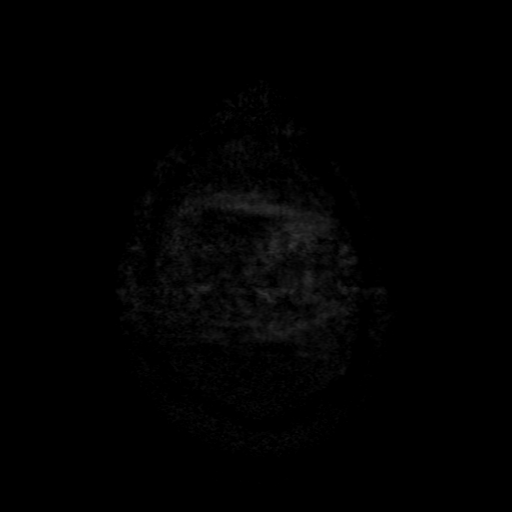

[Series 11: T2 · coronal · 5.0mm · 0.47mm/px · 2 of 30 slices shown (2 of 2)]
[im 1/30]
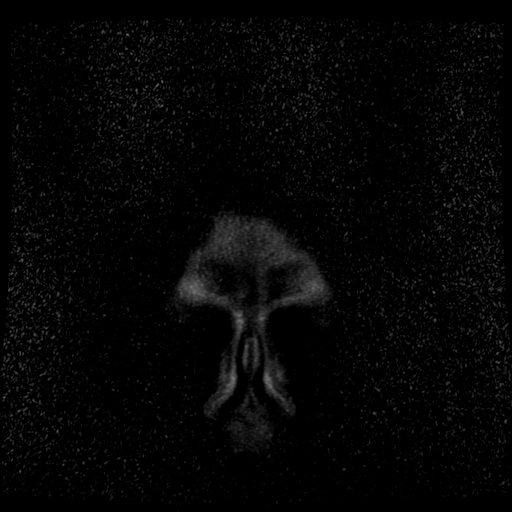
[im 30/30]
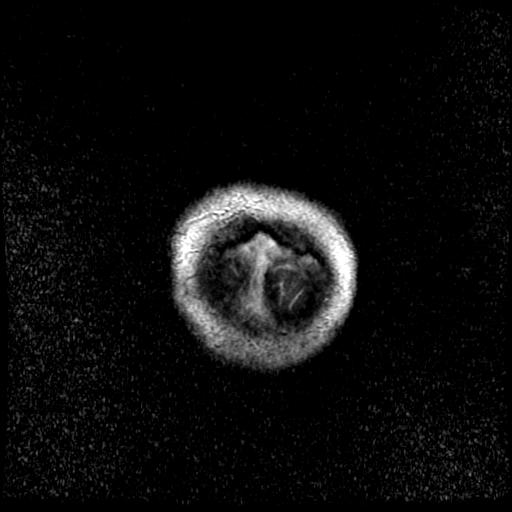

[Series 12: (person_name) repeat · axial · 3.0mm · 0.47mm/px · z∈[-149,-95]mm · 3 of 100 slices shown]
[im 1/100]
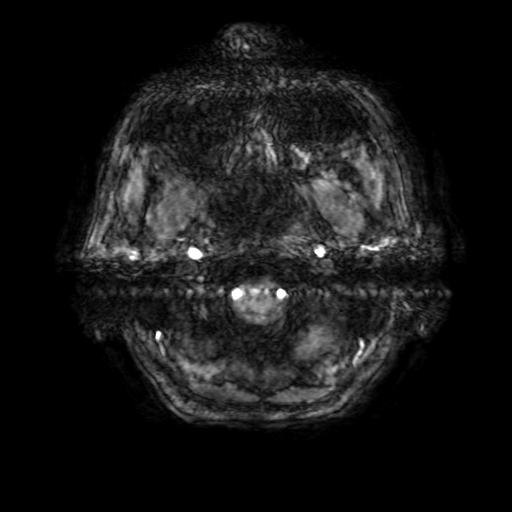
[im 20/100]
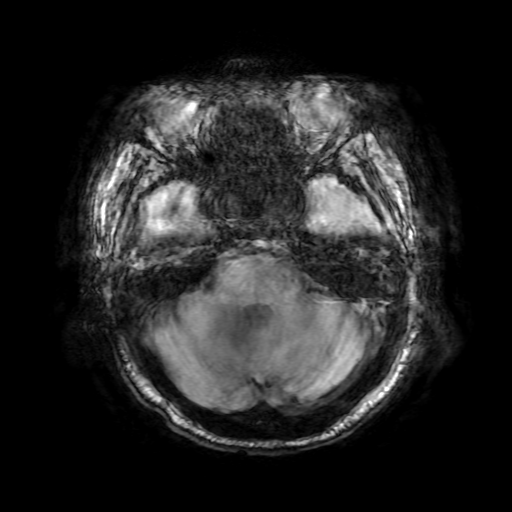
[im 40/100]
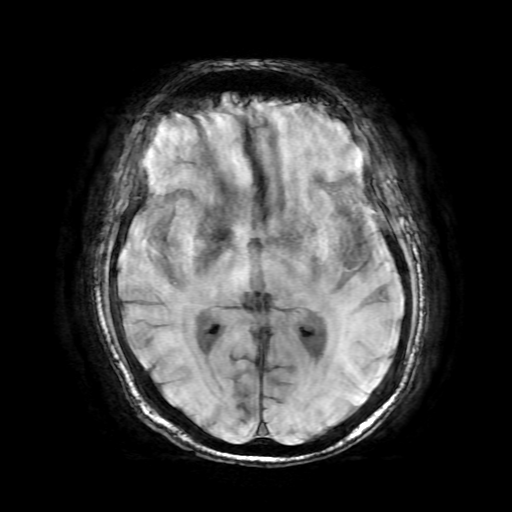

[Series 13: FLAIR · axial · 5.0mm · 0.47mm/px · 1 of 25 slices shown (3 of 3)]
[im 1/25]
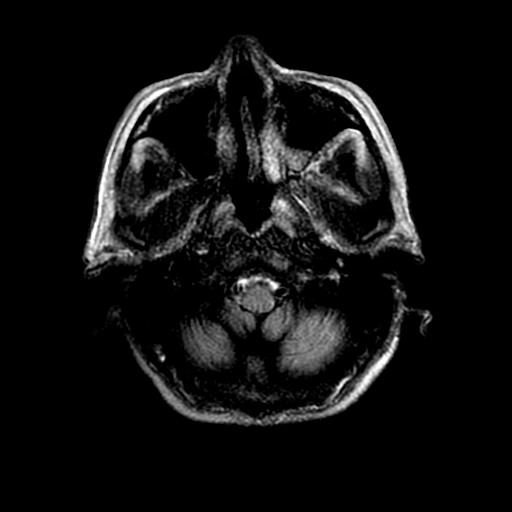

[Series 500: DWI · axial · 3.0mm · 0.94mm/px · z∈[-149,-12]mm · 3 of 50 slices shown (3 of 4)]
[im 1/50]
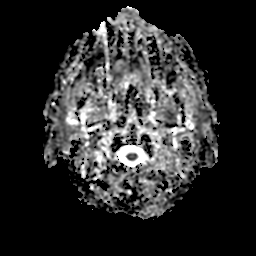
[im 25/50]
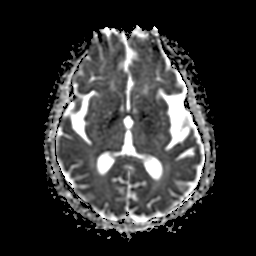
[im 50/50]
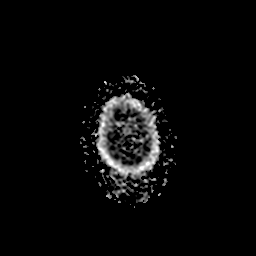

[Series 800: DWI · coronal · 5.0mm · 0.94mm/px · 2 of 35 slices shown (4 of 4)]
[im 1/35]
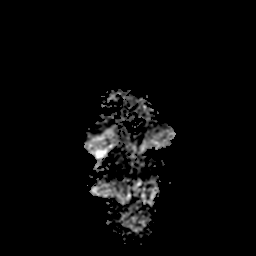
[im 35/35]
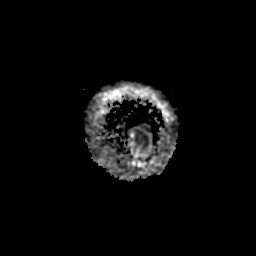

[29 of 48 positions shown; findings below may reference images not displayed]

FINDINGS: Study is intermittently degraded by motion artifact despite repeated
imaging attempts.

Major intracranial vascular flow voids are stable. No restricted
diffusion or evidence of acute infarction. Expected evolution of the
small right thalamic lacunar infarct since [REDACTED].

Gray and white matter signal appears stable since [REDACTED]. No midline
shift, mass effect, evidence of mass lesion, ventriculomegaly,
extra-axial collection or acute intracranial hemorrhage.
Cervicomedullary junction and pituitary are within normal limits.

Visible internal auditory structures appear normal. Mastoids are
clear. Mildly increased fluid level in the left sphenoid sinus.
Orbit and scalp soft tissues appear stable.

Skull bone marrow signal is stable and within normal limits.
Abnormal bone marrow at C3-C4 re- demonstrated, but stable since
[REDACTED]. The visualized cervical spine is stable.
IMPRESSION: No acute intracranial abnormality.

## 2016-09-25 ENCOUNTER — Ambulatory Visit: Payer: Medicare HMO | Admitting: Family

## 2016-10-01 ENCOUNTER — Emergency Department (HOSPITAL_COMMUNITY): Payer: Medicare HMO

## 2016-10-01 ENCOUNTER — Inpatient Hospital Stay (HOSPITAL_COMMUNITY)
Admission: EM | Admit: 2016-10-01 | Discharge: 2016-10-07 | DRG: 637 | Disposition: A | Discharge status: Home Health | Payer: Medicare HMO | Attending: Family Medicine | Admitting: Family Medicine

## 2016-10-01 ENCOUNTER — Encounter (HOSPITAL_COMMUNITY): Payer: Self-pay | Admitting: Emergency Medicine

## 2016-10-01 DIAGNOSIS — A419 Sepsis, unspecified organism: Secondary | ICD-10-CM

## 2016-10-01 DIAGNOSIS — R0603 Acute respiratory distress: Secondary | ICD-10-CM

## 2016-10-01 DIAGNOSIS — Z96641 Presence of right artificial hip joint: Secondary | ICD-10-CM | POA: Diagnosis present

## 2016-10-01 DIAGNOSIS — I82402 Acute embolism and thrombosis of unspecified deep veins of left lower extremity: Secondary | ICD-10-CM

## 2016-10-01 DIAGNOSIS — M6282 Rhabdomyolysis: Secondary | ICD-10-CM | POA: Diagnosis present

## 2016-10-01 DIAGNOSIS — I950 Idiopathic hypotension: Secondary | ICD-10-CM

## 2016-10-01 DIAGNOSIS — Z87891 Personal history of nicotine dependence: Secondary | ICD-10-CM | POA: Diagnosis not present

## 2016-10-01 DIAGNOSIS — N179 Acute kidney failure, unspecified: Secondary | ICD-10-CM

## 2016-10-01 DIAGNOSIS — E1101 Type 2 diabetes mellitus with hyperosmolarity with coma: Secondary | ICD-10-CM | POA: Diagnosis not present

## 2016-10-01 DIAGNOSIS — Z8249 Family history of ischemic heart disease and other diseases of the circulatory system: Secondary | ICD-10-CM

## 2016-10-01 DIAGNOSIS — Z794 Long term (current) use of insulin: Secondary | ICD-10-CM | POA: Diagnosis not present

## 2016-10-01 DIAGNOSIS — E1110 Type 2 diabetes mellitus with ketoacidosis without coma: Principal | ICD-10-CM | POA: Diagnosis present

## 2016-10-01 DIAGNOSIS — R4182 Altered mental status, unspecified: Secondary | ICD-10-CM

## 2016-10-01 DIAGNOSIS — Z9119 Patient's noncompliance with other medical treatment and regimen: Secondary | ICD-10-CM

## 2016-10-01 DIAGNOSIS — G9341 Metabolic encephalopathy: Secondary | ICD-10-CM | POA: Diagnosis present

## 2016-10-01 DIAGNOSIS — N170 Acute kidney failure with tubular necrosis: Secondary | ICD-10-CM | POA: Diagnosis present

## 2016-10-01 DIAGNOSIS — I959 Hypotension, unspecified: Secondary | ICD-10-CM | POA: Diagnosis present

## 2016-10-01 DIAGNOSIS — Z79899 Other long term (current) drug therapy: Secondary | ICD-10-CM

## 2016-10-01 DIAGNOSIS — E872 Acidosis: Secondary | ICD-10-CM | POA: Diagnosis not present

## 2016-10-01 DIAGNOSIS — R778 Other specified abnormalities of plasma proteins: Secondary | ICD-10-CM

## 2016-10-01 DIAGNOSIS — Z7901 Long term (current) use of anticoagulants: Secondary | ICD-10-CM | POA: Diagnosis not present

## 2016-10-01 DIAGNOSIS — N185 Chronic kidney disease, stage 5: Secondary | ICD-10-CM

## 2016-10-01 DIAGNOSIS — R748 Abnormal levels of other serum enzymes: Secondary | ICD-10-CM | POA: Diagnosis not present

## 2016-10-01 DIAGNOSIS — E860 Dehydration: Secondary | ICD-10-CM | POA: Diagnosis present

## 2016-10-01 DIAGNOSIS — Z9114 Patient's other noncompliance with medication regimen: Secondary | ICD-10-CM

## 2016-10-01 DIAGNOSIS — I248 Other forms of acute ischemic heart disease: Secondary | ICD-10-CM | POA: Diagnosis present

## 2016-10-01 DIAGNOSIS — E785 Hyperlipidemia, unspecified: Secondary | ICD-10-CM | POA: Diagnosis present

## 2016-10-01 DIAGNOSIS — E1310 Other specified diabetes mellitus with ketoacidosis without coma: Secondary | ICD-10-CM | POA: Diagnosis not present

## 2016-10-01 DIAGNOSIS — E875 Hyperkalemia: Secondary | ICD-10-CM | POA: Diagnosis present

## 2016-10-01 DIAGNOSIS — E1111 Type 2 diabetes mellitus with ketoacidosis with coma: Secondary | ICD-10-CM | POA: Diagnosis not present

## 2016-10-01 DIAGNOSIS — R52 Pain, unspecified: Secondary | ICD-10-CM

## 2016-10-01 DIAGNOSIS — Z86718 Personal history of other venous thrombosis and embolism: Secondary | ICD-10-CM | POA: Diagnosis not present

## 2016-10-01 DIAGNOSIS — I129 Hypertensive chronic kidney disease with stage 1 through stage 4 chronic kidney disease, or unspecified chronic kidney disease: Secondary | ICD-10-CM | POA: Diagnosis present

## 2016-10-01 DIAGNOSIS — E1165 Type 2 diabetes mellitus with hyperglycemia: Secondary | ICD-10-CM

## 2016-10-01 DIAGNOSIS — D631 Anemia in chronic kidney disease: Secondary | ICD-10-CM | POA: Diagnosis present

## 2016-10-01 DIAGNOSIS — I10 Essential (primary) hypertension: Secondary | ICD-10-CM | POA: Diagnosis not present

## 2016-10-01 DIAGNOSIS — E870 Hyperosmolality and hypernatremia: Secondary | ICD-10-CM | POA: Diagnosis present

## 2016-10-01 DIAGNOSIS — E1122 Type 2 diabetes mellitus with diabetic chronic kidney disease: Secondary | ICD-10-CM | POA: Diagnosis present

## 2016-10-01 DIAGNOSIS — N183 Chronic kidney disease, stage 3 unspecified: Secondary | ICD-10-CM

## 2016-10-01 DIAGNOSIS — R7989 Other specified abnormal findings of blood chemistry: Secondary | ICD-10-CM

## 2016-10-01 DIAGNOSIS — IMO0002 Reserved for concepts with insufficient information to code with codable children: Secondary | ICD-10-CM

## 2016-10-01 DIAGNOSIS — E0810 Diabetes mellitus due to underlying condition with ketoacidosis without coma: Secondary | ICD-10-CM | POA: Diagnosis not present

## 2016-10-01 DIAGNOSIS — E118 Type 2 diabetes mellitus with unspecified complications: Secondary | ICD-10-CM

## 2016-10-01 DIAGNOSIS — I82412 Acute embolism and thrombosis of left femoral vein: Secondary | ICD-10-CM | POA: Diagnosis not present

## 2016-10-01 LAB — I-STAT CHEM 8, ED
BUN: 41 mg/dL — ABNORMAL HIGH (ref 6–20)
Calcium, Ion: 1.19 mmol/L (ref 1.15–1.40)
Chloride: 101 mmol/L (ref 101–111)
Creatinine, Ser: 4.8 mg/dL — ABNORMAL HIGH (ref 0.61–1.24)
Glucose, Bld: >700 mg/dL (ref 65–99)
HCT: 40 % (ref 39.0–52.0)
Hemoglobin: 13.6 g/dL (ref 13.0–17.0)
Potassium: 6.3 mmol/L (ref 3.5–5.1)
Sodium: 127 mmol/L — ABNORMAL LOW (ref 135–145)
TCO2: 9 mmol/L (ref 0–100)

## 2016-10-01 LAB — BASIC METABOLIC PANEL
Anion gap: 22 — ABNORMAL HIGH (ref 5–15)
Anion gap: 16 — ABNORMAL HIGH (ref 5–15)
BUN: 43 mg/dL — ABNORMAL HIGH (ref 6–20)
BUN: 38 mg/dL — ABNORMAL HIGH (ref 6–20)
CO2: 9 mmol/L — ABNORMAL LOW (ref 22–32)
CO2: 11 mmol/L — ABNORMAL LOW (ref 22–32)
Calcium: 8.9 mg/dL (ref 8.9–10.3)
Calcium: 8.8 mg/dL — ABNORMAL LOW (ref 8.9–10.3)
Chloride: 96 mmol/L — ABNORMAL LOW (ref 101–111)
Chloride: 111 mmol/L (ref 101–111)
Creatinine, Ser: 5.14 mg/dL — ABNORMAL HIGH (ref 0.61–1.24)
Creatinine, Ser: 4.42 mg/dL — ABNORMAL HIGH (ref 0.61–1.24)
GFR calc Af Amer: 12 mL/min — ABNORMAL LOW (ref >60)
GFR calc Af Amer: 14 mL/min — ABNORMAL LOW (ref >60)
GFR calc non Af Amer: 10 mL/min — ABNORMAL LOW (ref >60)
GFR calc non Af Amer: 12 mL/min — ABNORMAL LOW (ref >60)
Glucose, Bld: 1134 mg/dL (ref 65–99)
Glucose, Bld: 351 mg/dL — ABNORMAL HIGH (ref 65–99)
Potassium: 6.5 mmol/L (ref 3.5–5.1)
Potassium: 4.2 mmol/L (ref 3.5–5.1)
Sodium: 127 mmol/L — ABNORMAL LOW (ref 135–145)
Sodium: 138 mmol/L (ref 135–145)

## 2016-10-01 LAB — CBC WITH DIFFERENTIAL/PLATELET
Basophils Absolute: 0 10*3/uL (ref 0.0–0.1)
Basophils Relative: 0 %
Eosinophils Absolute: 0 10*3/uL (ref 0.0–0.7)
Eosinophils Relative: 0 %
HCT: 39.1 % (ref 39.0–52.0)
Hemoglobin: 11.3 g/dL — ABNORMAL LOW (ref 13.0–17.0)
Lymphocytes Relative: 8 %
Lymphs Abs: 1.1 10*3/uL (ref 0.7–4.0)
MCH: 30.3 pg (ref 26.0–34.0)
MCHC: 28.9 g/dL — ABNORMAL LOW (ref 30.0–36.0)
MCV: 104.8 fL — ABNORMAL HIGH (ref 78.0–100.0)
Monocytes Absolute: 0.5 10*3/uL (ref 0.1–1.0)
Monocytes Relative: 4 %
Neutro Abs: 12.1 10*3/uL — ABNORMAL HIGH (ref 1.7–7.7)
Neutrophils Relative %: 88 %
Platelets: 303 10*3/uL (ref 150–400)
RBC: 3.73 MIL/uL — ABNORMAL LOW (ref 4.22–5.81)
RDW: 15.7 % — ABNORMAL HIGH (ref 11.5–15.5)
WBC: 13.8 10*3/uL — ABNORMAL HIGH (ref 4.0–10.5)

## 2016-10-01 LAB — URINALYSIS, ROUTINE W REFLEX MICROSCOPIC
Bilirubin Urine: NEGATIVE
Glucose, UA: >=500 mg/dL — AB
Ketones, ur: 5 mg/dL — AB
Leukocytes, UA: NEGATIVE
Nitrite: NEGATIVE
Protein, ur: 100 mg/dL — AB
Specific Gravity, Urine: 1.009 (ref 1.005–1.030)
pH: 5 (ref 5.0–8.0)

## 2016-10-01 LAB — CBG MONITORING, ED
Glucose-Capillary: >600 mg/dL (ref 65–99)
Glucose-Capillary: 588 mg/dL (ref 65–99)
Glucose-Capillary: 499 mg/dL — ABNORMAL HIGH (ref 65–99)

## 2016-10-01 LAB — I-STAT ARTERIAL BLOOD GAS, ED
Acid-base deficit: 24 mmol/L — ABNORMAL HIGH (ref 0.0–2.0)
Bicarbonate: 4.9 mmol/L — ABNORMAL LOW (ref 20.0–28.0)
O2 Saturation: 93 %
Patient temperature: 98.6
TCO2: 6 mmol/L (ref 0–100)
pCO2 arterial: 19 mmHg — CL (ref 32.0–48.0)
pH, Arterial: 7.024 — CL (ref 7.350–7.450)
pO2, Arterial: 98 mmHg (ref 83.0–108.0)

## 2016-10-01 LAB — RAPID URINE DRUG SCREEN, HOSP PERFORMED
Amphetamines: NOT DETECTED
Barbiturates: NOT DETECTED
Benzodiazepines: NOT DETECTED
Cocaine: NOT DETECTED
Opiates: POSITIVE — AB
Tetrahydrocannabinol: NOT DETECTED

## 2016-10-01 LAB — HEPATIC FUNCTION PANEL
ALT: 49 U/L (ref 17–63)
AST: 101 U/L — ABNORMAL HIGH (ref 15–41)
Albumin: 3.6 g/dL (ref 3.5–5.0)
Alkaline Phosphatase: 138 U/L — ABNORMAL HIGH (ref 38–126)
Bilirubin, Direct: <0.1 mg/dL — ABNORMAL LOW (ref 0.1–0.5)
Total Bilirubin: 0.4 mg/dL (ref 0.3–1.2)
Total Protein: 6.8 g/dL (ref 6.5–8.1)

## 2016-10-01 LAB — GLUCOSE, CAPILLARY
Glucose-Capillary: 147 mg/dL — ABNORMAL HIGH (ref 65–99)
Glucose-Capillary: 223 mg/dL — ABNORMAL HIGH (ref 65–99)
Glucose-Capillary: 321 mg/dL — ABNORMAL HIGH (ref 65–99)
Glucose-Capillary: 343 mg/dL — ABNORMAL HIGH (ref 65–99)

## 2016-10-01 LAB — TROPONIN I
Troponin I: 0.05 ng/mL (ref <0.03)
Troponin I: 0.05 ng/mL (ref <0.03)

## 2016-10-01 LAB — LACTIC ACID, PLASMA: Lactic Acid, Venous: 7.1 mmol/L (ref 0.5–1.9)

## 2016-10-01 LAB — PROCALCITONIN: Procalcitonin: 31.89 ng/mL

## 2016-10-01 LAB — MRSA PCR SCREENING: MRSA by PCR: NEGATIVE

## 2016-10-01 LAB — CK: Total CK: 1214 U/L — ABNORMAL HIGH (ref 49–397)

## 2016-10-01 LAB — BETA-HYDROXYBUTYRIC ACID: Beta-Hydroxybutyric Acid: 0.45 mmol/L — ABNORMAL HIGH (ref 0.05–0.27)

## 2016-10-01 MED ORDER — SODIUM CHLORIDE 0.9 % IV BOLUS (SEPSIS)
1000.0000 mL | Freq: Once | INTRAVENOUS | Status: AC
  Administered 2016-10-01: 1000 mL via INTRAVENOUS

## 2016-10-01 MED ORDER — DEXTROSE-NACL 5-0.45 % IV SOLN: INTRAVENOUS | Status: DC

## 2016-10-01 MED ORDER — SODIUM CHLORIDE 0.9 % IV SOLN: INTRAVENOUS | Status: DC

## 2016-10-01 MED ORDER — PIPERACILLIN-TAZOBACTAM IN DEX 2-0.25 GM/50ML IV SOLN
2.2500 g | Freq: Four times a day (QID) | INTRAVENOUS | Status: DC
  Administered 2016-10-02 – 2016-10-06 (×18): 2.25 g via INTRAVENOUS
  Filled 2016-10-01 (×22): qty 50

## 2016-10-01 MED ORDER — ORAL CARE MOUTH RINSE: 15.0000 mL | Freq: Two times a day (BID) | OROMUCOSAL | Status: DC

## 2016-10-01 MED ORDER — CALCIUM GLUCONATE 10 % IV SOLN
1.0000 g | Freq: Once | INTRAVENOUS | Status: AC
  Administered 2016-10-01: 1 g via INTRAVENOUS
  Filled 2016-10-01: qty 10

## 2016-10-01 MED ORDER — PANTOPRAZOLE SODIUM 40 MG IV SOLR
40.0000 mg | INTRAVENOUS | Status: DC
  Administered 2016-10-01 – 2016-10-02 (×2): 40 mg via INTRAVENOUS
  Filled 2016-10-01 (×3): qty 40

## 2016-10-01 MED ORDER — SODIUM CHLORIDE 0.9 % IV SOLN
INTRAVENOUS | Status: DC
  Administered 2016-10-01: 17:00:00 via INTRAVENOUS

## 2016-10-01 MED ORDER — CHLORHEXIDINE GLUCONATE 0.12 % MT SOLN
15.0000 mL | Freq: Two times a day (BID) | OROMUCOSAL | Status: DC
  Administered 2016-10-01 – 2016-10-02 (×2): 15 mL via OROMUCOSAL

## 2016-10-01 MED ORDER — DEXTROSE 50 % IV SOLN: 25.0000 mL | INTRAVENOUS | Status: DC | PRN

## 2016-10-01 MED ORDER — INSULIN REGULAR BOLUS VIA INFUSION
0.0000 [IU] | Freq: Three times a day (TID) | INTRAVENOUS | Status: DC
  Filled 2016-10-01: qty 10

## 2016-10-01 MED ORDER — VANCOMYCIN HCL IN DEXTROSE 1-5 GM/200ML-% IV SOLN
1000.0000 mg | Freq: Once | INTRAVENOUS | Status: AC
  Administered 2016-10-01: 1000 mg via INTRAVENOUS
  Filled 2016-10-01: qty 200

## 2016-10-01 MED ORDER — SODIUM CHLORIDE 0.9 % IV SOLN
INTRAVENOUS | Status: DC
  Administered 2016-10-01: 4.4 [IU]/h via INTRAVENOUS
  Filled 2016-10-01: qty 2.5

## 2016-10-01 MED ORDER — SODIUM CHLORIDE 0.9 % IV SOLN
INTRAVENOUS | Status: DC
  Administered 2016-10-01: 21:00:00 via INTRAVENOUS

## 2016-10-01 MED ORDER — CALCIUM GLUCONATE 10 % IV SOLN
1.0000 g | Freq: Once | INTRAVENOUS | Status: DC
  Filled 2016-10-01: qty 10

## 2016-10-01 MED ORDER — HEPARIN SODIUM (PORCINE) 5000 UNIT/ML IJ SOLN
5000.0000 [IU] | Freq: Three times a day (TID) | INTRAMUSCULAR | Status: DC
  Administered 2016-10-01 – 2016-10-07 (×18): 5000 [IU] via SUBCUTANEOUS
  Filled 2016-10-01 (×18): qty 1

## 2016-10-01 NOTE — ED Notes (Signed)
MD aware of RN unable to get IV. Resident at bedside to attempt/

## 2016-10-01 NOTE — ED Provider Notes (Signed)
[facility] DEPT Provider Note   CSN: [id number] Arrival date & time: 10/01/16  1512     History   Chief Complaint Chief Complaint  Patient presents with  . Hyperglycemia  . Altered Mental Status    HPI [name] [name] is a 70 y.o. male.  The history is provided by a relative.  Hyperglycemia  Blood sugar level PTA:  >500 Severity:  Severe Onset quality:  Gradual Duration:  4 hours Timing:  Constant Progression:  Worsening Chronicity:  Recurrent Diabetes status:  Controlled with insulin Current diabetic therapy:  LA and SA insulin Time since last antidiabetic medication:  1 hour Context: noncompliance   Associated symptoms: altered mental status, confusion, dehydration and malaise   Associated symptoms: no abdominal pain, no chest pain, no fever, no nausea, no shortness of breath, no syncope, no vomiting and no weakness   Altered Mental Status   Associated symptoms include confusion. Pertinent negatives include no weakness.  Patient also fell in his home. Daughter was trying to call the patient numerous times and when he did not pick up daughter went to go check on him. Patient was found down on a carpeted floor. Patient was noted to have a blood sugar about 500 by EMS. Daughter gave 20 units of his regular insulin 1 hour prior to arrival. Patient is also blood thinners for history of DVT. Daughter states he is noncompliant with this insulin.  Past Medical History:  Diagnosis Date  . 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 Closed comminuted intertrochanteric fracture of left femur ([place])   . Diabetes mellitus without complication ([place])   . Diabetic hyperosmolar non-ketotic state ([place]) 05/15/2016  . DKA (diabetic ketoacidoses) ([place]) 05/09/2016  . Hypertension   . Postoperative anemia due to acute blood loss 06/18/2016  . Retroperitoneal hematoma 06/18/2016  . Vitamin B 12 deficiency 06/18/2016    Patient Active Problem List   Diagnosis Date Noted  . DKA (diabetic ketoacidoses) ([place]) 10/01/2016  . Fall 08/17/2016  . Rhabdomyolysis 08/17/2016  . Noncompliance with medication regimen 08/17/2016  . On continuous oral anticoagulation 08/17/2016  . Syncope and collapse 07/06/2016  . Left leg DVT ([place]) 07/06/2016  . Syncope 07/05/2016  . Postoperative anemia due to acute blood loss 06/18/2016  . Retroperitoneal hematoma 06/18/2016  . Vitamin B 12 deficiency 06/18/2016  . Elective surgery   . Postop check   . Closed comminuted intertrochanteric fracture of left femur ([place])   . Renal insufficiency   . Hyponatremia 05/15/2016  . Anxiety 05/15/2016  . History of CVA (cerebrovascular accident) 05/15/2016  . Acute on chronic renal failure ([place]) 05/09/2016  . Hypoglycemia 11/02/2015  . Chronic prescription benzodiazepine use 11/02/2015  . Narcotic dependency, continuous ([place]) 11/02/2015  . HTN (hypertension), malignant 11/01/2015  . Uncontrolled type 2 diabetes mellitus with diabetic nephropathy, with long-term current use of insulin ([place]) 08/12/2015  . Benign essential HTN 08/12/2015  . Dyslipidemia associated with type 2 diabetes mellitus ([place]) 08/12/2015  . Depression 08/12/2015  . CKD (chronic kidney disease) stage 3, GFR 30-59 ml/min 03/20/2014    Past Surgical History:  Procedure Laterality Date  . HIP ARTHROPLASTY Right 02/05/2013   Procedure: ARTHROPLASTY BIPOLAR HIP;  Surgeon: [name], MD;  Location: [facility];  Service: Orthopedics;  Laterality: Right;  . INTRAMEDULLARY (IM) NAIL INTERTROCHANTERIC Left 06/16/2016   Procedure: INTRAMEDULLARY (IM) NAIL INTERTROCHANTRIC;  Surgeon: Rod Can, MD;  Location: [facility];  Service: Orthopedics;  Laterality: Left;  Home Medications    Prior to Admission medications   Medication Sig Start Date End Date Taking? Authorizing Provider  acetaminophen (TYLENOL) 325 MG tablet Take 2 tablets (650 mg total) by mouth every 6 (six) hours as needed for mild  pain (or Fever >/= 101). 08/14/15   Robbie Lis, MD  amLODipine (NORVASC) 10 MG tablet Take 1 tablet (10 mg total) by mouth daily. 08/14/15   Robbie Lis, MD  atorvastatin (LIPITOR) 10 MG tablet Take 1 tablet (10 mg total) by mouth daily at 6 PM. 03/23/15   [name], DO  blood glucose meter kit and supplies Use daily to check blood sugar. DX: E11.09 04/24/16   Golden Circle, [name]  carvedilol (COREG) 6.25 MG tablet Take 1 tablet (6.25 mg total) by mouth 2 (two) times daily with a meal. 05/12/16   Thurnell Lose, MD  cyanocobalamin (,VITAMIN B-12,) 1000 MCG/ML injection Inject 1 mL (1,000 mcg total) into the muscle every 30 (thirty) days. Patient taking differently: Inject 1,000 mcg into the muscle every Monday.  06/20/16   Christina P Rama, MD  feeding supplement, GLUCERNA SHAKE, (GLUCERNA SHAKE) LIQD Take 237 mLs by mouth 2 (two) times daily between meals. Patient not taking: Reported on 08/17/2016 08/14/15   Robbie Lis, MD  glucose blood (COOL BLOOD GLUCOSE TEST STRIPS) test strip Use as instructed to check blood sugar up to 3 times a day. DX: E11.09 04/24/16   Golden Circle, [name]  HYDROcodone-acetaminophen (NORCO/VICODIN) 5-325 MG tablet Take 1 tablet by mouth every 4 (four) hours as needed for severe pain. 08/19/16   Costin Karlyne Greenspan, MD  insulin aspart (NOVOLOG FLEXPEN) 100 UNIT/ML FlexPen Inject 0-12 Units into the skin 3 (three) times daily with meals. Per sliding scale, 0-150 = 0 units, 151-200 = 2 units, 201-250 = 4 units, 251-300 = 6 units, 301-350 = 8 units, 351-400 = 10 units, 401-500 = 12 units (call MD) 08/19/16   Costin Karlyne Greenspan, MD  insulin glargine (LANTUS) 100 UNIT/ML injection Inject 0.1 mLs (10 Units total) into the skin daily. 08/26/16   Golden Circle, [name]  Lancets MISC Use as instructed to check blood sugar up to 3 times a day. DX: E11.09 10/16/15   Golden Circle, [name]  polyethylene glycol powder (GLYCOLAX/MIRALAX) powder Take 17 g by mouth every morning.  06/20/16    Historical Provider, MD  rivaroxaban (XARELTO) 20 MG TABS tablet Take 1 tablet (20 mg total) by mouth daily with supper. STARTING October 16. 07/28/16   Bonnielee Haff, MD  senna (SENOKOT) 8.6 MG TABS tablet Take 1 tablet (8.6 mg total) by mouth 2 (two) times daily. Patient not taking: Reported on 08/17/2016 06/20/16   Venetia Maxon Rama, MD  thiamine 100 MG tablet Take 1 tablet (100 mg total) by mouth daily. 11/03/15   Nat Math, MD    Family History Family History  Problem Relation Age of Onset  . Diabetes Mother   . Alzheimer's disease Mother   . Hypertension Mother   . Hyperlipidemia Father   . Hypertension Father   . Healthy Maternal Grandmother   . Pneumonia Maternal Grandfather     Social History Social History  Substance Use Topics  . Smoking status: Former Smoker    Packs/day: 0.25    Years: 30.00    Types: Cigarettes    Quit date: 01/31/2013  . Smokeless tobacco: Never Used  . Alcohol use No     Allergies   Patient has  no known allergies.   Review of Systems Review of Systems  Constitutional: Negative for chills and fever.  Eyes: Negative for pain and visual disturbance.  Respiratory: Negative for cough and shortness of breath.   Cardiovascular: Negative for chest pain and syncope.  Gastrointestinal: Negative for abdominal pain, nausea and vomiting.  Musculoskeletal: Negative for back pain and neck pain.  Skin: Negative for rash.  Neurological: Negative for weakness, numbness and headaches.  Psychiatric/Behavioral: Positive for confusion.  All other systems reviewed and are negative.    Physical Exam Updated Vital Signs BP 119/57   Pulse 82   Temp 99.1 F (37.3 C) (Oral)   Resp 11   Ht '5\' 5"'  (1.651 m)   Wt 64.4 kg   SpO2 100%   BMI 23.63 kg/m   Physical Exam  Constitutional: He appears well-developed and well-nourished. No distress.  HENT:  Head: Normocephalic and atraumatic.  Mouth/Throat: Mucous membranes are dry.  Eyes: Conjunctivae and  EOM are normal. Pupils are equal, round, and reactive to light.  Neck: Neck supple.  Cardiovascular: Regular rhythm, normal heart sounds and intact distal pulses.  Tachycardia present.   No murmur heard. Pulmonary/Chest: Breath sounds normal. No respiratory distress. He has no wheezes. He has no rales.  Kussmaul breathing  Abdominal: Soft. There is no tenderness.  Musculoskeletal: He exhibits no edema, tenderness (No spinal tenderness) or deformity.  Neurological: He has normal strength. He is disoriented. No sensory deficit.  Somnolent but arousable  Skin: Skin is warm and dry.  Nursing note and vitals reviewed.    ED Treatments / Results  Labs (all labs ordered are listed, but only abnormal results are displayed) Labs Reviewed  CBC WITH DIFFERENTIAL/PLATELET - Abnormal; Notable for the following:       Result Value   WBC 13.8 (*)    RBC 3.73 (*)    Hemoglobin 11.3 (*)    MCV 104.8 (*)    MCHC 28.9 (*)    RDW 15.7 (*)    Neutro Abs 12.1 (*)    All other components within normal limits  BASIC METABOLIC PANEL - Abnormal; Notable for the following:    Sodium 127 (*)    Potassium 6.5 (*)    Chloride 96 (*)    CO2 9 (*)    Glucose, Bld 1,134 (*)    BUN 43 (*)    Creatinine, Ser 5.14 (*)    GFR calc non Af Amer 10 (*)    GFR calc Af Amer 12 (*)    Anion gap 22 (*)    All other components within normal limits  TROPONIN I - Abnormal; Notable for the following:    Troponin I 0.05 (*)    All other components within normal limits  I-STAT CHEM 8, ED - Abnormal; Notable for the following:    Sodium 127 (*)    Potassium 6.3 (*)    BUN 41 (*)    Creatinine, Ser 4.80 (*)    Glucose, Bld >700 (*)    All other components within normal limits  I-STAT ARTERIAL BLOOD GAS, ED - Abnormal; Notable for the following:    pH, Arterial 7.024 (*)    pCO2 arterial 19.0 (*)    Bicarbonate 4.9 (*)    Acid-base deficit 24.0 (*)    All other components within normal limits  CBG MONITORING, ED  - Abnormal; Notable for the following:    Glucose-Capillary >600 (*)    All other components within normal limits  CBG MONITORING,  ED - Abnormal; Notable for the following:    Glucose-Capillary 588 (*)    All other components within normal limits  URINE CULTURE  CULTURE, BLOOD (ROUTINE X 2)  CULTURE, BLOOD (ROUTINE X 2)  BETA-HYDROXYBUTYRIC ACID  URINALYSIS, ROUTINE W REFLEX MICROSCOPIC  BASIC METABOLIC PANEL  BASIC METABOLIC PANEL  BASIC METABOLIC PANEL  BASIC METABOLIC PANEL  CK  HEPATIC FUNCTION PANEL  RAPID URINE DRUG SCREEN, HOSP PERFORMED  CBC  MAGNESIUM  PHOSPHORUS    EKG  EKG Interpretation  Date/Time:  Wednesday October 01 2016 15:23:11 EST Ventricular Rate:  109 PR Interval:    QRS Duration: 100 QT Interval:  341 QTC Calculation: 460 R Axis:   -72 Text Interpretation:  Sinus tachycardia Left anterior fascicular block Anteroseptal infarct, old Confirmed by DELO  MD, DOUGLAS ([id number]) on 10/01/2016 3:46:39 PM       Radiology Ct Head Wo Contrast  Result Date: 10/01/2016 CLINICAL DATA:  Fall. Found on floor unresponsive. Initial encounter. EXAM: CT HEAD WITHOUT CONTRAST CT CERVICAL SPINE WITHOUT CONTRAST TECHNIQUE: Multidetector CT imaging of the head and cervical spine was performed following the standard protocol without intravenous contrast. Multiplanar CT image reconstructions of the cervical spine were also generated. COMPARISON:  08/17/2016 FINDINGS: CT HEAD FINDINGS Brain: No evidence of acute infarction, hemorrhage, hydrocephalus, extra-axial collection or mass lesion/mass effect. Vascular: Atherosclerotic calcification. Skull: Negative for fracture Sinuses/Orbits: No acute finding. Mild secretions layering the sinuses. Left maxillary sinusitis is improved from comparison. Nasal septal perforation. CT CERVICAL SPINE FINDINGS Alignment: Reversal of cervical lordosis from degenerative disease. No traumatic malalignment when compared to prior. Skull base and  vertebrae: Negative for acute fracture. T2 and T4 superior endplate concavities are chronic. Soft tissues and spinal canal: No gross canal hematoma or prevertebral edema. Disc levels: Advanced disc degeneration with narrowing, spurring, and sclerosis from C3-4 to C6-7. Multilevel facet arthropathy. Upper chest: No acute finding. IMPRESSION: 1. No evidence of acute intracranial or cervical spine injury. 2. Stable exam compared to prior.  Incidental findings noted above. Electronically Signed   By: Monte Fantasia M.D.   On: 10/01/2016 16:50   Ct Cervical Spine Wo Contrast  Result Date: 10/01/2016 CLINICAL DATA:  Fall. Found on floor unresponsive. Initial encounter. EXAM: CT HEAD WITHOUT CONTRAST CT CERVICAL SPINE WITHOUT CONTRAST TECHNIQUE: Multidetector CT imaging of the head and cervical spine was performed following the standard protocol without intravenous contrast. Multiplanar CT image reconstructions of the cervical spine were also generated. COMPARISON:  08/17/2016 FINDINGS: CT HEAD FINDINGS Brain: No evidence of acute infarction, hemorrhage, hydrocephalus, extra-axial collection or mass lesion/mass effect. Vascular: Atherosclerotic calcification. Skull: Negative for fracture Sinuses/Orbits: No acute finding. Mild secretions layering the sinuses. Left maxillary sinusitis is improved from comparison. Nasal septal perforation. CT CERVICAL SPINE FINDINGS Alignment: Reversal of cervical lordosis from degenerative disease. No traumatic malalignment when compared to prior. Skull base and vertebrae: Negative for acute fracture. T2 and T4 superior endplate concavities are chronic. Soft tissues and spinal canal: No gross canal hematoma or prevertebral edema. Disc levels: Advanced disc degeneration with narrowing, spurring, and sclerosis from C3-4 to C6-7. Multilevel facet arthropathy. Upper chest: No acute finding. IMPRESSION: 1. No evidence of acute intracranial or cervical spine injury. 2. Stable exam compared  to prior.  Incidental findings noted above. Electronically Signed   By: Monte Fantasia M.D.   On: 10/01/2016 16:50   Dg Pelvis Portable  Result Date: 10/01/2016 CLINICAL DATA:  Pain after fall. Initial encounter. EXAM: PORTABLE PELVIS 1-2 VIEWS COMPARISON:  06/16/2016 FINDINGS: Status post ORIF of intertrochanteric left femur fracture. Hypertrophic healing has developed along the lower fracture margin when compared to prior. Dynamic hip screw has settled compared to prior, expected. Bipolar right hip hemiarthroplasty appears located and intact in the frontal projection. No visible pelvic ring fracture or diastasis. Lower lumbar disc degeneration. IMPRESSION: 1. No acute finding. 2. Right hip hemiarthroplasty and left femur intertrochanteric fracture ORIF. The left femur fracture shows progressive healing since 06/16/2016 comparison. If hip pain, two-view hip radiographs would increase sensitivity. Electronically Signed   By: Monte Fantasia M.D.   On: 10/01/2016 17:20    Procedures Procedures (including critical care time) IV access  IV access was obtained by M.D. due to nursing not being able to obtain IV access. Ultrasound guidance was used. 20-gauge Angiocath placed in the left before meals with one attempt.   Medications Ordered in ED Medications  insulin regular bolus via infusion 0-10 Units (not administered)  insulin regular (NOVOLIN R,HUMULIN R) 250 Units in sodium chloride 0.9 % 250 mL (1 Units/mL) infusion (10.6 Units/hr Intravenous Rate/Dose Change 10/01/16 1828)  dextrose 50 % solution 25 mL (not administered)  0.9 %  sodium chloride infusion (not administered)  dextrose 5 %-0.45 % sodium chloride infusion (not administered)  heparin injection 5,000 Units (not administered)  sodium chloride 0.9 % bolus 1,000 mL (not administered)  sodium chloride 0.9 % bolus 1,000 mL (1,000 mLs Intravenous New Bag/Given 10/01/16 1659)    And  sodium chloride 0.9 % bolus 1,000 mL (1,000 mLs  Intravenous New Bag/Given 10/01/16 1617)  calcium gluconate inj 10% (1 g) URGENT USE ONLY! (1 g Intravenous Given 10/01/16 1618)  sodium chloride 0.9 % bolus 1,000 mL (0 mLs Intravenous Stopped 10/01/16 1817)     Initial Impression / Assessment and Plan / ED Course  I have reviewed the triage vital signs and the nursing notes.  Pertinent labs & imaging results that were available during my care of the patient were reviewed by me and considered in my medical decision making (see chart for details).  Clinical Course     Patient is a 70 year old male with multiple medical comorbidities including noncompliance with his insulin regimen for diabetes who presents with altered mental status and fall. Patient was noted to be severely hyperglycemic as well.  Daughter called the patient multiple times and when he did not answer she went to check on him. Patient was found down but awake. Daughter gave 20 units of regular insulin at home prior to arrival. Patient here has a blood sugar of over 1100 and is acidotic with a pH of 7.02 and anion gap of 22. Bicarbonate was 5. Patient given IV fluid resuscitation and started on a insulin drip. Potassium was noted to be 6.3 with hyperacute T waves. 1 amp of calcium gluconate given for cardioprotection. Creatinine was also noted to be above 5 consistent with the acute renal failure. We will plan to repeat periodic BMPs.  Patient is somnolent and has significant altered mental status. No need for intubation at this time as he is protecting his airway and not vomiting. Critical care's consultation to evaluate the patient given his severe hyperglycemia, acidosis and poor mental status.   Patient will be admitted to the ICU for further management of his DKA and MENTAL status. CT scans of his head and neck were negative for any acute findings.  Patient seen with attending, Dr. Stark Jock.  Final Clinical Impressions(s) / ED Diagnoses   Final diagnoses:  Diabetic  ketoacidosis without coma associated with other specified diabetes mellitus ([place])  Acute renal failure, unspecified acute renal failure type ([place])  Altered mental status, unspecified altered mental status type    New Prescriptions New Prescriptions   No medications on file     Tobie Poet, DO 10/01/16 [place], MD 10/01/16 2319

## 2016-10-01 NOTE — Progress Notes (Signed)
Pharmacy Antibiotic Note  [name] [name] is a 70 y.o. male admitted on 10/01/2016 with sepsis w/ AKI.  Pharmacy has been consulted for Vancocin and Zosyn dosing.  Plan: Vancomycin 1000mg  IV x1 and f/u renal function before redosing.  Goal trough 15-20. Zosyn 2.25g IV Q6H.  Height: 5\' 9"  (175.3 cm) Weight: 145 lb 1 oz (65.8 kg) IBW/kg (Calculated) : 70.7  Temp (24hrs), Avg:98.5 F (36.9 C), Min:97.9 F (36.6 C), Max:99.1 F (37.3 C)   Recent Labs Lab 10/01/16 1544 10/01/16 1617 10/01/16 1824 10/01/16 1910  WBC 13.8*  --   --   --   CREATININE 5.14* 4.80* 4.42*  --   LATICACIDVEN  --   --   --  7.1*    Estimated Creatinine Clearance: 14.3 mL/min (by C-G formula based on SCr of 4.42 mg/dL (H)).    No Known Allergies   Thank you for allowing pharmacy to be a part of this patient's care.  Wynona Neat, PharmD, BCPS  10/01/2016 11:28 PM

## 2016-10-01 NOTE — ED Notes (Signed)
P[laced patient on the monitor did ekg shown to er doctor

## 2016-10-01 NOTE — H&P (Signed)
PULMONARY / CRITICAL CARE MEDICINE   Name: [name] [name] MRN: [id number] DOB: [birth year]    ADMISSION DATE:  10/01/2016 CONSULTATION DATE: 10/01/16  REFERRING MD:  Dr. Nyoka Lint - EDP  CHIEF COMPLAINT:  Hyperglycemia   HISTORY OF PRESENT ILLNESS: Information obtain via medical record as patient is encephalopathic  70 year old male with PMH of CVA (2016), DM (noncompliant with insulin) - recent admission in July 2017 for DKA, HTN, DVT (06/2016) on Xarelto, and CKD III. Presented to ED 12/20 after being found down by daughter at home. Daughter stated that she gave 20 units of regular insulin about 1 hour before his arrival. Per EMS blood glucose was 500. Upon arrival to ED patient was lethargic but able to respond approprietely to questions. ABG 7.024/19/98. Glucose 1,1134. PCCM called to [facility].   Recent hospitalization 11/5-11/7 for hypoglycemia and Rhabdomyolysis after being found down by daughter with a glucose level of 37.   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  PAST SURGICAL HISTORY: He  has a past surgical history that includes Hip Arthroplasty (Right, 02/05/2013) and Intramedullary (im) nail intertrochanteric (Left, 06/16/2016).  No Known Allergies  No current facility-administered medications on file prior to encounter.    Current Outpatient Prescriptions on File Prior to Encounter  Medication Sig  . acetaminophen (TYLENOL) 325 MG tablet Take 2 tablets (650 mg total) by mouth every 6 (six) hours as needed for mild pain (or  Fever >/= 101).  Marland Kitchen amLODipine (NORVASC) 10 MG tablet Take 1 tablet (10 mg total) by mouth daily.  Marland Kitchen atorvastatin (LIPITOR) 10 MG tablet Take 1 tablet (10 mg total) by mouth daily at 6 PM.  . blood glucose meter kit and supplies Use daily to check blood sugar. DX: E11.09  . carvedilol (COREG) 6.25 MG tablet Take 1 tablet (6.25 mg total) by mouth 2 (two) times daily with a meal.  . cyanocobalamin (,VITAMIN B-12,) 1000 MCG/ML injection Inject 1 mL (1,000 mcg total) into the muscle every 30 (thirty) days. (Patient taking differently: Inject 1,000 mcg into the muscle every Monday. )  . feeding supplement, GLUCERNA SHAKE, (GLUCERNA SHAKE) LIQD Take 237 mLs by mouth 2 (two) times daily between meals. (Patient not taking: Reported on 08/17/2016)  . glucose blood (COOL BLOOD GLUCOSE TEST STRIPS) test strip Use as instructed to check blood sugar up to 3 times a day. DX: E11.09  . HYDROcodone-acetaminophen (NORCO/VICODIN) 5-325 MG tablet Take 1 tablet by mouth every 4 (four) hours as needed for severe pain.  Marland Kitchen insulin aspart (NOVOLOG FLEXPEN) 100 UNIT/ML FlexPen Inject 0-12 Units into the skin 3 (three) times daily with meals. Per sliding scale, 0-150 = 0 units, 151-200 = 2 units, 201-250 = 4 units, 251-300 = 6 units, 301-350 = 8 units, 351-400 = 10 units, 401-500 = 12 units (call MD)  . insulin glargine (LANTUS) 100 UNIT/ML injection Inject 0.1 mLs (10 Units total) into the skin daily.  . Lancets MISC Use as instructed to check blood sugar up to 3 times a day. DX: E11.09  . polyethylene glycol powder (GLYCOLAX/MIRALAX) powder Take 17 g by mouth every morning.   . rivaroxaban (XARELTO) 20 MG  TABS tablet Take 1 tablet (20 mg total) by mouth daily with supper. STARTING October 16.  . senna (SENOKOT) 8.6 MG TABS tablet Take 1 tablet (8.6 mg total) by mouth 2 (two) times daily. (Patient not taking: Reported on 08/17/2016)  . thiamine 100 MG tablet Take 1 tablet (100 mg total) by mouth daily.    FAMILY HISTORY:   His indicated that his mother is alive. He indicated that his father is deceased. He indicated that his maternal grandmother is deceased. He indicated that his maternal grandfather is deceased. He indicated that his paternal grandmother is deceased. He indicated that his paternal grandfather is deceased.    SOCIAL HISTORY: He  reports that he quit smoking about 3 years ago. His smoking use included Cigarettes. He has a 7.50 pack-year smoking history. He has never used smokeless tobacco. He reports that he does not drink alcohol or use drugs.  REVIEW OF SYSTEMS:   Unable to obtain as patient is encephalopathic   SUBJECTIVE:  Presents to ED found down in DKA.   VITAL SIGNS: BP 119/57   Pulse 82   Temp 99.1 F (37.3 C) (Oral)   Resp 11   Ht _0  (1.651 m)   Wt 64.4 kg (142 lb)   SpO2 100%   BMI 23.63 kg/m   HEMODYNAMICS:    VENTILATOR SETTINGS:    INTAKE / OUTPUT: No intake/output data recorded.  PHYSICAL EXAMINATION: General:  Adult male, lying in bed  Neuro: Lethargic, opens eyes to verbal stimuli, pupils intact  HEENT:  C-Collar in place Cardiovascular:  RRR, no MRG, NI S1/S2 Lungs: Diminished breath sounds, labored breathing  Abdomen:  Non-distended, active bowel sounds  Musculoskeletal: c-collar in place  Skin: warm, dry, intact   LABS:  BMET  Recent Labs Lab 10/01/16 1544 10/01/16 1617  NA 127* 127*  K 6.5* 6.3*  CL 96* 101  CO2 9*  --   BUN 43* 41*  CREATININE 5.14* 4.80*  GLUCOSE 1,134* >700*    Electrolytes  Recent Labs Lab 10/01/16 1544  CALCIUM 8.9    CBC  Recent Labs Lab 10/01/16 1544 10/01/16 1617  WBC 13.8*  --   HGB 11.3* 13.6  HCT 39.1 40.0  PLT 303  --     Coag's No results for input(s): APTT, INR in the last 168 hours.  Sepsis Markers No results for input(s): LATICACIDVEN, PROCALCITON, O2SATVEN in the last 168 hours.  ABG  Recent Labs Lab 10/01/16 1622  PHART 7.024*  PCO2ART 19.0*  PO2ART 98.0    Liver  Enzymes No results for input(s): AST, ALT, ALKPHOS, BILITOT, ALBUMIN in the last 168 hours.  Cardiac Enzymes  Recent Labs Lab 10/01/16 1544  TROPONINI 0.05*    Glucose  Recent Labs Lab 10/01/16 1704 10/01/16 1826  GLUCAP >600* 588*    Imaging Ct Head Wo Contrast  Result Date: 10/01/2016 CLINICAL DATA:  Fall. Found on floor unresponsive. Initial encounter. EXAM: CT HEAD WITHOUT CONTRAST CT CERVICAL SPINE WITHOUT CONTRAST TECHNIQUE: Multidetector CT imaging of the head and cervical spine was performed following the standard protocol without intravenous contrast. Multiplanar CT image reconstructions of the cervical spine were also generated. COMPARISON:  08/17/2016 FINDINGS: CT HEAD FINDINGS Brain: No evidence of acute infarction, hemorrhage, hydrocephalus, extra-axial collection or mass lesion/mass effect. Vascular: Atherosclerotic calcification. Skull: Negative for fracture Sinuses/Orbits: No acute finding. Mild secretions layering the sinuses. Left maxillary sinusitis is improved from comparison. Nasal septal perforation. CT CERVICAL SPINE FINDINGS Alignment: Reversal of cervical lordosis from  degenerative disease. No traumatic malalignment when compared to prior. Skull base and vertebrae: Negative for acute fracture. T2 and T4 superior endplate concavities are chronic. Soft tissues and spinal canal: No gross canal hematoma or prevertebral edema. Disc levels: Advanced disc degeneration with narrowing, spurring, and sclerosis from C3-4 to C6-7. Multilevel facet arthropathy. Upper chest: No acute finding. IMPRESSION: 1. No evidence of acute intracranial or cervical spine injury. 2. Stable exam compared to prior.  Incidental findings noted above. Electronically Signed   By: Monte Fantasia M.D.   On: 10/01/2016 16:50   Ct Cervical Spine Wo Contrast  Result Date: 10/01/2016 CLINICAL DATA:  Fall. Found on floor unresponsive. Initial encounter. EXAM: CT HEAD WITHOUT CONTRAST CT CERVICAL SPINE  WITHOUT CONTRAST TECHNIQUE: Multidetector CT imaging of the head and cervical spine was performed following the standard protocol without intravenous contrast. Multiplanar CT image reconstructions of the cervical spine were also generated. COMPARISON:  08/17/2016 FINDINGS: CT HEAD FINDINGS Brain: No evidence of acute infarction, hemorrhage, hydrocephalus, extra-axial collection or mass lesion/mass effect. Vascular: Atherosclerotic calcification. Skull: Negative for fracture Sinuses/Orbits: No acute finding. Mild secretions layering the sinuses. Left maxillary sinusitis is improved from comparison. Nasal septal perforation. CT CERVICAL SPINE FINDINGS Alignment: Reversal of cervical lordosis from degenerative disease. No traumatic malalignment when compared to prior. Skull base and vertebrae: Negative for acute fracture. T2 and T4 superior endplate concavities are chronic. Soft tissues and spinal canal: No gross canal hematoma or prevertebral edema. Disc levels: Advanced disc degeneration with narrowing, spurring, and sclerosis from C3-4 to C6-7. Multilevel facet arthropathy. Upper chest: No acute finding. IMPRESSION: 1. No evidence of acute intracranial or cervical spine injury. 2. Stable exam compared to prior.  Incidental findings noted above. Electronically Signed   By: Monte Fantasia M.D.   On: 10/01/2016 16:50   Dg Pelvis Portable  Result Date: 10/01/2016 CLINICAL DATA:  Pain after fall. Initial encounter. EXAM: PORTABLE PELVIS 1-2 VIEWS COMPARISON:  06/16/2016 FINDINGS: Status post ORIF of intertrochanteric left femur fracture. Hypertrophic healing has developed along the lower fracture margin when compared to prior. Dynamic hip screw has settled compared to prior, expected. Bipolar right hip hemiarthroplasty appears located and intact in the frontal projection. No visible pelvic ring fracture or diastasis. Lower lumbar disc degeneration. IMPRESSION: 1. No acute finding. 2. Right hip hemiarthroplasty and  left femur intertrochanteric fracture ORIF. The left femur fracture shows progressive healing since 06/16/2016 comparison. If hip pain, two-view hip radiographs would increase sensitivity. Electronically Signed   By: Monte Fantasia M.D.   On: 10/01/2016 17:20     STUDIES:  Echo 06/2016 > EF 60-65%, with normal function  CT Head 12/20 > No acute  CT C Spine 12/20 > No acute   CULTURES: Blood 12/20 > Urine 12/20 >  ANTIBIOTICS:   SIGNIFICANT EVENTS: 12/20 > Presents to ED after being found down in home >DKA  LINES/TUBES:   DISCUSSION: 70 year old male with PMH as written above. Presents from home after being found down by daughter. On arrival to ED patient was in DKA. CT head and neck negative for any injuries. DKA protocol initiated.   ASSESSMENT / PLAN:  PULMONARY A: Increased WOB secondary to DKA  At risk for intubation  P:   Maintain saturation >92 Repeat ABG  CARDIOVASCULAR A:  Hypotension  Elevated Trop - (Prob Demand Ischemia) H/O DVT of Left Posterial Tibial Vein (on Xarelto has been treated since Sep), HTN, HLD  P:  Cardiac Monitoring  Hold Home HTN medications  Trend Trop  Will re-doppler to eval DVT (has been treated since September)   RENAL A:   Metabolic Acidosis  Acute on Chronic Kidney Injury  Hyperkalemia  H/O Rhabdo, CKD III (Base Crt 1.2-2) P:   BMP q4H Replace electrolytes as needed Aggressive IV fluid hydration - bolus 1 additional liter (total 4L)   NS @ 125/hr  Trend CK   GASTROINTESTINAL A:   No issues  P:   NPO PPI   HEMATOLOGIC A:   No issues  P:  Trend CBC   INFECTIOUS A:   Leukocytosis  -afebrile, does not appear infectious  P:   Urine and blood culture  Trend WBC and Fever curve  Trend Lactic Acid and Procal   ENDOCRINE A:   DKA  H/O Noncompliant DM P:   DKA protocol  Beta-hydroxybutyric acid   NEUROLOGIC A:   Acute Metabolic Encephalopathy  P:   Monitor  RASS goal: 0   FAMILY  - Updates:  Sister updated at bedside in ED   - Inter-disciplinary family meet or Palliative Care meeting due by:  12/27    Hayden Pedro, AG-ACNP [facility]  Pgr: [phone]  PCCM Pgr: [phone]  70 yo male found unresponsive at home.  He had elevated blood sugar and acidosis.  This was associated with hyperkalemia, pseudohyponatremia, and AKI.  He was also found to have elevated procalcitonin, but no obvious source of infection.  He was started on IV fluids and insulin.  Sleepy, wakes up easily.  Slow verbal response.  HR regular.  No wheeze.  Abd soft.  No edema.  No rashes.  Assessment/plan: Hyperosmolar non ketotic state. - IV fluids, insulin - f/u BMET, electrolytes  Elevated procalcitonin. - add Abx - source of infection unclear - f/u CX's  AKI. - volume resuscitate  CC time by me independent of APP time 27 minutes  Chesley Mires, MD [place] 10/01/2016, 11:26 PM Pager:  [phone] After 3pm call: [phone]

## 2016-10-01 NOTE — Progress Notes (Signed)
CRITICAL VALUE ALERT  Critical value received:  Lactic 7.1  Date of notification: 10/01/16  Time of notification:  2217  Critical value read back:Yes.    Nurse who received alert:  Vic Blackbird RN  MD notified (1st page):  eLink MD  Time of first page:  2225  Responding MD:  Lamonte Sakai  Time MD responded: 2225

## 2016-10-01 NOTE — Progress Notes (Signed)
Sepsis - Repeat Assessment  Performed at:    10/01/2016, 11:30 PM   Vitals     Blood pressure 129/69, pulse 71, temperature 97.4 F (36.3 C), temperature source Oral, resp. rate 14, height 5\' 9"  (1.753 m), weight 145 lb 1 oz (65.8 kg), SpO2 99 %.  Heart:     Regular rate and rhythm  Lungs:    CTA  Capillary Refill:   <2 sec  Peripheral Pulse:   Radial pulse palpable  Skin:     Normal Color   Chesley Mires, MD [place] 10/01/2016, 11:30 PM Pager:  [phone] After 3pm call: [phone]

## 2016-10-01 NOTE — ED Triage Notes (Signed)
Pt in from home via [facility] EMS with CBG >600 and AMS. Pt has hx of CVA with L sided weakness.. Per daughter, pt had not been taking his insulin doses appropriately. Thinking he may be low, she gave him some juice, he felt weak and slid into the floor. She then gave him an unknown amount of fast-acting insulin. Pt arrives to ED lethargic, tachypneic, oriented x 3. 102/32, 104, 98%.

## 2016-10-02 ENCOUNTER — Inpatient Hospital Stay (HOSPITAL_COMMUNITY): Payer: Medicare HMO

## 2016-10-02 DIAGNOSIS — E1310 Other specified diabetes mellitus with ketoacidosis without coma: Secondary | ICD-10-CM

## 2016-10-02 DIAGNOSIS — R6521 Severe sepsis with septic shock: Secondary | ICD-10-CM

## 2016-10-02 DIAGNOSIS — Z86718 Personal history of other venous thrombosis and embolism: Secondary | ICD-10-CM

## 2016-10-02 DIAGNOSIS — E872 Acidosis: Secondary | ICD-10-CM

## 2016-10-02 LAB — BASIC METABOLIC PANEL
Anion gap: 8 (ref 5–15)
Anion gap: 9 (ref 5–15)
Anion gap: 13 (ref 5–15)
Anion gap: 9 (ref 5–15)
BUN: 36 mg/dL — ABNORMAL HIGH (ref 6–20)
BUN: 37 mg/dL — ABNORMAL HIGH (ref 6–20)
BUN: 40 mg/dL — ABNORMAL HIGH (ref 6–20)
BUN: 41 mg/dL — ABNORMAL HIGH (ref 6–20)
CO2: 16 mmol/L — ABNORMAL LOW (ref 22–32)
CO2: 14 mmol/L — ABNORMAL LOW (ref 22–32)
CO2: 11 mmol/L — ABNORMAL LOW (ref 22–32)
CO2: 14 mmol/L — ABNORMAL LOW (ref 22–32)
Calcium: 8.2 mg/dL — ABNORMAL LOW (ref 8.9–10.3)
Calcium: 7.9 mg/dL — ABNORMAL LOW (ref 8.9–10.3)
Calcium: 7.8 mg/dL — ABNORMAL LOW (ref 8.9–10.3)
Calcium: 7.7 mg/dL — ABNORMAL LOW (ref 8.9–10.3)
Chloride: 114 mmol/L — ABNORMAL HIGH (ref 101–111)
Chloride: 113 mmol/L — ABNORMAL HIGH (ref 101–111)
Chloride: 111 mmol/L (ref 101–111)
Chloride: 112 mmol/L — ABNORMAL HIGH (ref 101–111)
Creatinine, Ser: 4.09 mg/dL — ABNORMAL HIGH (ref 0.61–1.24)
Creatinine, Ser: 4.21 mg/dL — ABNORMAL HIGH (ref 0.61–1.24)
Creatinine, Ser: 4.6 mg/dL — ABNORMAL HIGH (ref 0.61–1.24)
Creatinine, Ser: 5.01 mg/dL — ABNORMAL HIGH (ref 0.61–1.24)
GFR calc Af Amer: 16 mL/min — ABNORMAL LOW (ref >60)
GFR calc Af Amer: 15 mL/min — ABNORMAL LOW (ref >60)
GFR calc Af Amer: 13 mL/min — ABNORMAL LOW (ref >60)
GFR calc Af Amer: 12 mL/min — ABNORMAL LOW (ref >60)
GFR calc non Af Amer: 13 mL/min — ABNORMAL LOW (ref >60)
GFR calc non Af Amer: 13 mL/min — ABNORMAL LOW (ref >60)
GFR calc non Af Amer: 12 mL/min — ABNORMAL LOW (ref >60)
GFR calc non Af Amer: 10 mL/min — ABNORMAL LOW (ref >60)
Glucose, Bld: 72 mg/dL (ref 65–99)
Glucose, Bld: 193 mg/dL — ABNORMAL HIGH (ref 65–99)
Glucose, Bld: 394 mg/dL — ABNORMAL HIGH (ref 65–99)
Glucose, Bld: 91 mg/dL (ref 65–99)
Potassium: 3.9 mmol/L (ref 3.5–5.1)
Potassium: 4.3 mmol/L (ref 3.5–5.1)
Potassium: 4.6 mmol/L (ref 3.5–5.1)
Potassium: 4 mmol/L (ref 3.5–5.1)
Sodium: 138 mmol/L (ref 135–145)
Sodium: 136 mmol/L (ref 135–145)
Sodium: 135 mmol/L (ref 135–145)
Sodium: 135 mmol/L (ref 135–145)

## 2016-10-02 LAB — HEPATIC FUNCTION PANEL
ALT: 37 U/L (ref 17–63)
AST: 70 U/L — ABNORMAL HIGH (ref 15–41)
Albumin: 2.5 g/dL — ABNORMAL LOW (ref 3.5–5.0)
Alkaline Phosphatase: 96 U/L (ref 38–126)
Bilirubin, Direct: <0.1 mg/dL — ABNORMAL LOW (ref 0.1–0.5)
Total Bilirubin: 0.5 mg/dL (ref 0.3–1.2)
Total Protein: 5 g/dL — ABNORMAL LOW (ref 6.5–8.1)

## 2016-10-02 LAB — GLUCOSE, CAPILLARY
Glucose-Capillary: 105 mg/dL — ABNORMAL HIGH (ref 65–99)
Glucose-Capillary: 121 mg/dL — ABNORMAL HIGH (ref 65–99)
Glucose-Capillary: 122 mg/dL — ABNORMAL HIGH (ref 65–99)
Glucose-Capillary: 132 mg/dL — ABNORMAL HIGH (ref 65–99)
Glucose-Capillary: 142 mg/dL — ABNORMAL HIGH (ref 65–99)
Glucose-Capillary: 147 mg/dL — ABNORMAL HIGH (ref 65–99)
Glucose-Capillary: 147 mg/dL — ABNORMAL HIGH (ref 65–99)
Glucose-Capillary: 152 mg/dL — ABNORMAL HIGH (ref 65–99)
Glucose-Capillary: 158 mg/dL — ABNORMAL HIGH (ref 65–99)
Glucose-Capillary: 160 mg/dL — ABNORMAL HIGH (ref 65–99)
Glucose-Capillary: 185 mg/dL — ABNORMAL HIGH (ref 65–99)
Glucose-Capillary: 197 mg/dL — ABNORMAL HIGH (ref 65–99)
Glucose-Capillary: 201 mg/dL — ABNORMAL HIGH (ref 65–99)
Glucose-Capillary: 237 mg/dL — ABNORMAL HIGH (ref 65–99)
Glucose-Capillary: 282 mg/dL — ABNORMAL HIGH (ref 65–99)
Glucose-Capillary: 290 mg/dL — ABNORMAL HIGH (ref 65–99)
Glucose-Capillary: 80 mg/dL (ref 65–99)
Glucose-Capillary: 80 mg/dL (ref 65–99)
Glucose-Capillary: 97 mg/dL (ref 65–99)
Glucose-Capillary: 98 mg/dL (ref 65–99)

## 2016-10-02 LAB — TROPONIN I
Troponin I: 0.05 ng/mL (ref <0.03)
Troponin I: 0.03 ng/mL (ref <0.03)

## 2016-10-02 LAB — CBC
HCT: 28.7 % — ABNORMAL LOW (ref 39.0–52.0)
Hemoglobin: 9.6 g/dL — ABNORMAL LOW (ref 13.0–17.0)
MCH: 30.5 pg (ref 26.0–34.0)
MCHC: 33.4 g/dL (ref 30.0–36.0)
MCV: 91.1 fL (ref 78.0–100.0)
Platelets: 210 10*3/uL (ref 150–400)
RBC: 3.15 MIL/uL — ABNORMAL LOW (ref 4.22–5.81)
RDW: 14.3 % (ref 11.5–15.5)
WBC: 9.9 10*3/uL (ref 4.0–10.5)

## 2016-10-02 LAB — PROCALCITONIN: Procalcitonin: 45.48 ng/mL

## 2016-10-02 LAB — PHOSPHORUS: Phosphorus: 5.2 mg/dL — ABNORMAL HIGH (ref 2.5–4.6)

## 2016-10-02 LAB — MAGNESIUM: Magnesium: 1.9 mg/dL (ref 1.7–2.4)

## 2016-10-02 LAB — LACTIC ACID, PLASMA: Lactic Acid, Venous: 4.2 mmol/L (ref 0.5–1.9)

## 2016-10-02 MED ORDER — SODIUM CHLORIDE 0.9 % IV BOLUS (SEPSIS)
500.0000 mL | Freq: Once | INTRAVENOUS | Status: AC
  Administered 2016-10-02: 500 mL via INTRAVENOUS

## 2016-10-02 MED ORDER — INSULIN ASPART 100 UNIT/ML ~~LOC~~ SOLN
0.0000 [IU] | SUBCUTANEOUS | Status: DC
  Administered 2016-10-02 – 2016-10-03 (×3): 2 [IU] via SUBCUTANEOUS
  Administered 2016-10-03: 3 [IU] via SUBCUTANEOUS
  Administered 2016-10-03: 5 [IU] via SUBCUTANEOUS

## 2016-10-02 MED ORDER — INSULIN GLARGINE 100 UNIT/ML ~~LOC~~ SOLN
10.0000 [IU] | Freq: Every day | SUBCUTANEOUS | Status: DC
  Administered 2016-10-02 – 2016-10-06 (×5): 10 [IU] via SUBCUTANEOUS
  Filled 2016-10-02 (×5): qty 0.1

## 2016-10-02 MED ORDER — SODIUM CHLORIDE 0.9% FLUSH: 10.0000 mL | INTRAVENOUS | Status: DC | PRN

## 2016-10-02 MED ORDER — SODIUM CHLORIDE 0.9% FLUSH
10.0000 mL | Freq: Two times a day (BID) | INTRAVENOUS | Status: DC
  Administered 2016-10-02 (×2): 10 mL
  Administered 2016-10-03: 20 mL
  Administered 2016-10-04 – 2016-10-07 (×2): 10 mL

## 2016-10-02 MED ORDER — SODIUM CHLORIDE 0.45 % IV SOLN
INTRAVENOUS | Status: DC
  Administered 2016-10-02 – 2016-10-03 (×3): via INTRAVENOUS

## 2016-10-02 NOTE — Progress Notes (Signed)
PULMONARY / CRITICAL CARE MEDICINE   Name: [name] [name] MRN: [id number] DOB: 10-10-1946    ADMISSION DATE:  10/01/2016 CONSULTATION DATE: 10/01/16  REFERRING MD:  Dr. Nyoka Lint - EDP  CHIEF COMPLAINT:  Hyperglycemia   HISTORY OF PRESENT ILLNESS: Information obtain via medical record as patient is encephalopathic  70 year old male with PMH of CVA (2016), DM (noncompliant with insulin) - recent admission in July 2017 for DKA, HTN, DVT (06/2016) on Xarelto, and CKD III. Presented to ED 12/20 after being found down by daughter at home. Daughter stated that she gave 20 units of regular insulin about 1 hour before his arrival. Per EMS blood glucose was 500. Upon arrival to ED patient was lethargic but able to respond approprietely to questions. ABG 7.024/19/98. Glucose 1,1134. PCCM called to [place].   Recent hospitalization 11/5-11/7 for hypoglycemia and Rhabdomyolysis after being found down by daughter with a glucose level of 37.   SIGNIFICANT EVENTS: 12/20 > Presents to ED after being found down in home >DKA  SUBJECTIVE:  Doing well, sugars down, insulin gtt off per g-stabiliser  VITAL SIGNS: BP (!) 57/48   Pulse (!) 56   Temp 97.3 F (36.3 C) (Oral)   Resp 10   Ht 5\' 9"  (1.753 m)   Wt 145 lb 1 oz (65.8 kg)   SpO2 100%   BMI 21.42 kg/m   HEMODYNAMICS:    VENTILATOR SETTINGS:    INTAKE / OUTPUT: I/O last 3 completed shifts: In: 5563.4 [I.V.:5013.4; IV Piggyback:550] Out: 500 [Urine:500]  PHYSICAL EXAMINATION: General:  Adult male, lying in bed  Neuro: awake, alert, conversant, mild aphasia HEENT:  No pallor, icterus Cardiovascular:  RRR, no MRG, NI S1/S2 Lungs: Diminished breath sounds, labored breathing  Abdomen:  Non-distended, active bowel sounds  Musculoskeletal: no deformity Skin: warm, dry, intact   LABS:  BMET  Recent Labs Lab 10/01/16 1824 10/02/16 0225 10/02/16 0600  NA 138 138 136  K 4.2 3.9 4.3  CL 111 114* 113*  CO2 11* 16* 14*  BUN 38* 36* 37*   CREATININE 4.42* 4.09* 4.21*  GLUCOSE 351* 72 193*    Electrolytes  Recent Labs Lab 10/01/16 1824 10/02/16 0225 10/02/16 0600  CALCIUM 8.8* 8.2* 7.9*  MG  --   --  1.9  PHOS  --   --  5.2*    CBC  Recent Labs Lab 10/01/16 1544 10/01/16 1617 10/02/16 0600  WBC 13.8*  --  9.9  HGB 11.3* 13.6 9.6*  HCT 39.1 40.0 28.7*  PLT 303  --  210    Coag's No results for input(s): APTT, INR in the last 168 hours.  Sepsis Markers  Recent Labs Lab 10/01/16 1910 10/02/16 0142  LATICACIDVEN 7.1* 4.2*  PROCALCITON 31.89  --     ABG  Recent Labs Lab 10/01/16 1622  PHART 7.024*  PCO2ART 19.0*  PO2ART 98.0    Liver Enzymes  Recent Labs Lab 10/01/16 1824 10/02/16 0600  AST 101* 70*  ALT 49 37  ALKPHOS 138* 96  BILITOT 0.4 0.5  ALBUMIN 3.6 2.5*    Cardiac Enzymes  Recent Labs Lab 10/01/16 1921 10/02/16 0225 10/02/16 0721  TROPONINI 0.05* 0.05* 0.03*    Glucose  Recent Labs Lab 10/02/16 0430 10/02/16 0528 10/02/16 0632 10/02/16 0722 10/02/16 0805 10/02/16 0856  GLUCAP 97 105* 147* 197* 152* 132*    Imaging Ct Head Wo Contrast  Result Date: 10/01/2016 CLINICAL DATA:  Fall. Found on floor unresponsive. Initial encounter. EXAM: CT HEAD WITHOUT  CONTRAST CT CERVICAL SPINE WITHOUT CONTRAST TECHNIQUE: Multidetector CT imaging of the head and cervical spine was performed following the standard protocol without intravenous contrast. Multiplanar CT image reconstructions of the cervical spine were also generated. COMPARISON:  08/17/2016 FINDINGS: CT HEAD FINDINGS Brain: No evidence of acute infarction, hemorrhage, hydrocephalus, extra-axial collection or mass lesion/mass effect. Vascular: Atherosclerotic calcification. Skull: Negative for fracture Sinuses/Orbits: No acute finding. Mild secretions layering the sinuses. Left maxillary sinusitis is improved from comparison. Nasal septal perforation. CT CERVICAL SPINE FINDINGS Alignment: Reversal of cervical  lordosis from degenerative disease. No traumatic malalignment when compared to prior. Skull base and vertebrae: Negative for acute fracture. T2 and T4 superior endplate concavities are chronic. Soft tissues and spinal canal: No gross canal hematoma or prevertebral edema. Disc levels: Advanced disc degeneration with narrowing, spurring, and sclerosis from C3-4 to C6-7. Multilevel facet arthropathy. Upper chest: No acute finding. IMPRESSION: 1. No evidence of acute intracranial or cervical spine injury. 2. Stable exam compared to prior.  Incidental findings noted above. Electronically Signed   By: Monte Fantasia M.D.   On: 10/01/2016 16:50   Ct Cervical Spine Wo Contrast  Result Date: 10/01/2016 CLINICAL DATA:  Fall. Found on floor unresponsive. Initial encounter. EXAM: CT HEAD WITHOUT CONTRAST CT CERVICAL SPINE WITHOUT CONTRAST TECHNIQUE: Multidetector CT imaging of the head and cervical spine was performed following the standard protocol without intravenous contrast. Multiplanar CT image reconstructions of the cervical spine were also generated. COMPARISON:  08/17/2016 FINDINGS: CT HEAD FINDINGS Brain: No evidence of acute infarction, hemorrhage, hydrocephalus, extra-axial collection or mass lesion/mass effect. Vascular: Atherosclerotic calcification. Skull: Negative for fracture Sinuses/Orbits: No acute finding. Mild secretions layering the sinuses. Left maxillary sinusitis is improved from comparison. Nasal septal perforation. CT CERVICAL SPINE FINDINGS Alignment: Reversal of cervical lordosis from degenerative disease. No traumatic malalignment when compared to prior. Skull base and vertebrae: Negative for acute fracture. T2 and T4 superior endplate concavities are chronic. Soft tissues and spinal canal: No gross canal hematoma or prevertebral edema. Disc levels: Advanced disc degeneration with narrowing, spurring, and sclerosis from C3-4 to C6-7. Multilevel facet arthropathy. Upper chest: No acute finding.  IMPRESSION: 1. No evidence of acute intracranial or cervical spine injury. 2. Stable exam compared to prior.  Incidental findings noted above. Electronically Signed   By: Monte Fantasia M.D.   On: 10/01/2016 16:50   Dg Pelvis Portable  Result Date: 10/01/2016 CLINICAL DATA:  Pain after fall. Initial encounter. EXAM: PORTABLE PELVIS 1-2 VIEWS COMPARISON:  06/16/2016 FINDINGS: Status post ORIF of intertrochanteric left femur fracture. Hypertrophic healing has developed along the lower fracture margin when compared to prior. Dynamic hip screw has settled compared to prior, expected. Bipolar right hip hemiarthroplasty appears located and intact in the frontal projection. No visible pelvic ring fracture or diastasis. Lower lumbar disc degeneration. IMPRESSION: 1. No acute finding. 2. Right hip hemiarthroplasty and left femur intertrochanteric fracture ORIF. The left femur fracture shows progressive healing since 06/16/2016 comparison. If hip pain, two-view hip radiographs would increase sensitivity. Electronically Signed   By: Monte Fantasia M.D.   On: 10/01/2016 17:20   Dg Chest Port 1 View  Result Date: 10/02/2016 CLINICAL DATA:  Initial evaluation for central line placement. EXAM: PORTABLE CHEST 1 VIEW COMPARISON:  Prior radiograph from 07/05/2016. FINDINGS: There has been interval placement of a left IJ approach centra venous catheter. Catheter seen coursing across the expected location of the left brachiocephalic vein and in cephalad into what is probably the superior SVC. Repositioning recommended. Cardiac  and mediastinal silhouettes are stable, and remain within normal limits. Lungs are normally inflated. No focal infiltrates. No pulmonary edema or pleural effusion. No pneumothorax. No acute osseous abnormality. IMPRESSION: 1. Malpositioned left IJ approach central venous catheter with tip projecting superiorly, likely in the proximal SVC. Repositioning recommended. 2. Otherwise stable appearance of  the chest with no active cardiopulmonary disease identified. These results will be called to the ordering clinician or representative by the Radiologist Assistant, and communication documented in the PACS or zVision Dashboard. Electronically Signed   By: Jeannine Boga M.D.   On: 10/02/2016 02:26     STUDIES:  Echo 06/2016 > EF 60-65%, with normal function  CT Head 12/20 > No acute  CT C Spine 12/20 > No acute  Duplex 12/21 neg  CULTURES: Blood 12/20 > Urine 12/20 >  ANTIBIOTICS:  12/20 zosyn >> 12/20 vanc >>   LINES/TUBES: 12/20  [facility] >>  DISCUSSION: 70 year old male with PMH as written above. Presents from home after being found down by daughter. On arrival to ED patient was in DKA. CT head and neck negative for any injuries. DKA protocol initiated.   ASSESSMENT / PLAN:  PULMONARY A: Increased WOB secondary to DKA -resolved P:   Maintain saturation >92  CARDIOVASCULAR A:  Hypotension  Elevated Trop - (Prob Demand Ischemia) H/O DVT of Left Posterial Tibial Vein (on Xarelto has been treated since Sep) -resolved  HTN, HLD  P:  Cardiac Monitoring  Hold Home HTN medications  Dc xarelto  RENAL A:   Metabolic Acidosis  Acute on Chronic Kidney Injury  Hyperkalemia  H/O Rhabdo, CKD III (Base Crt 1.2-2) P:   BMP q8h Replace electrolytes as needed 1/2 NS @ 75/h  GASTROINTESTINAL A:   No issues  P:   diab diet  PPI   HEMATOLOGIC A:   No issues  P:  Trend CBC   INFECTIOUS A:   Elevated procalcitonin ? source P:   Urine and blood culture  Empiric zosyn/ vanc Trend Procal ,  Lactic Acid improved from 7 to 4  ENDOCRINE A:   DKA -slight high Beta-hydroxybutyric acid confirms H/O Noncompliant DM P:   Dc insulin gtt Give 10 u lantus, start diab diet & switch to SSI -mod   NEUROLOGIC A:   Acute Metabolic Encephalopathy -resolved P:     FAMILY  - Updates: Sister updated at bedside in ED   - Inter-disciplinary family meet or Palliative  Care meeting due by:  12/27  Cc time x 35m  Kara Mead MD. [facility]. [place] Pager [phone] If no response call [phone]   10/02/2016

## 2016-10-02 NOTE — Progress Notes (Signed)
PCCM Interval Progress Note  Asked by RN to assess pt for clearance of C-spine to facilitate removing C-collar.  CT of cspine reviewed > no acute injury. On exam, pt denies any subjective pain, no tenderness to palpation, has FROM.  C-spine cleared and OK to remove C-collar.   Montey Hora, [place] [facility] Pager: [phone]  or [phone] 10/02/2016, 1:32 AM

## 2016-10-02 NOTE — Procedures (Signed)
Central Venous Catheter Insertion Procedure Note [name] [name] [id number] May 16, 1946  Procedure: Insertion of Central Venous Catheter Indications: Assessment of intravascular volume, Drug and/or fluid administration and Frequent blood sampling  Procedure Details Consent: Risks of procedure as well as the alternatives and risks of each were explained to the (patient/caregiver).  Consent for procedure obtained. Time Out: Verified patient identification, verified procedure, site/side was marked, verified correct patient position, special equipment/implants available, medications/allergies/relevent history reviewed, required imaging and test results available.  Performed  Maximum sterile technique was used including antiseptics, cap, gloves, gown, hand hygiene, mask and sheet. Skin prep: Chlorhexidine; local anesthetic administered A antimicrobial bonded/coated triple lumen catheter was placed in the left internal jugular vein using the Seldinger technique.  Evaluation Blood flow good Complications: No apparent complications Patient did tolerate procedure well. Chest X-ray ordered to verify placement.  CXR: pending.  Procedure performed under direct ultrasound guidance for real time vessel cannulation.      Montey Hora, [place] Pager: [phone]  or [phone] 10/02/2016, 1:29 AM

## 2016-10-02 NOTE — Progress Notes (Signed)
Blood pressure systolic high 74B-ULA 45X. Ashby Dawes, MD made aware. 556ml NS bolus to be given per orders.

## 2016-10-02 NOTE — Progress Notes (Signed)
*  PRELIMINARY RESULTS* Vascular Ultrasound left lower eextremity venous duplex has been completed.  Preliminary findings: Visualized veins of the left lower extremity appear negative for deep vein thrombosis on this exam.  Myrtie Cruise Demetries Coia 10/02/2016, 9:31 AM

## 2016-10-02 NOTE — Progress Notes (Signed)
Per Ashby Dawes ok to infuse fluids through CVC although line needs to be advanced.

## 2016-10-02 NOTE — Progress Notes (Signed)
Patient still hypotensive in the 70s. Ashby Dawes, MD made aware. CVP done and patient's CVP was 5. Plan to give another 500 ml bolus.

## 2016-10-02 NOTE — Progress Notes (Signed)
Patient able to state that he does not feel any pain in neck. Cspine CT negative. Shearon Stalls, [place] @ bedside and evaluated patient. C-collar removed.

## 2016-10-02 NOTE — Progress Notes (Signed)
eLink Physician-Brief Progress Note Patient Name: [name] [name] DOB: December 01, 1945 MRN: [id number]   Date of Service  10/02/2016  HPI/Events of Note  hypotension  eICU Interventions  500cc bolus x 2.      Intervention Category Major Interventions: Hypotension - evaluation and management  Laverle Hobby 10/02/2016, 6:33 AM

## 2016-10-02 NOTE — Progress Notes (Signed)
eLink Physician-Brief Progress Note Patient Name: [name] [name] DOB: 07/11/1946 MRN: [id number]   Date of Service  10/02/2016  HPI/Events of Note  Left TLC in right brachiocephalic vein.   eICU Interventions  Currently on insulin drip, fluids, ok to use.         Laverle Hobby 10/02/2016, 2:08 AM

## 2016-10-03 DIAGNOSIS — G9341 Metabolic encephalopathy: Secondary | ICD-10-CM

## 2016-10-03 DIAGNOSIS — I82402 Acute embolism and thrombosis of unspecified deep veins of left lower extremity: Secondary | ICD-10-CM

## 2016-10-03 DIAGNOSIS — N183 Chronic kidney disease, stage 3 (moderate): Secondary | ICD-10-CM

## 2016-10-03 DIAGNOSIS — E1111 Type 2 diabetes mellitus with ketoacidosis with coma: Secondary | ICD-10-CM

## 2016-10-03 DIAGNOSIS — E1165 Type 2 diabetes mellitus with hyperglycemia: Secondary | ICD-10-CM

## 2016-10-03 DIAGNOSIS — E118 Type 2 diabetes mellitus with unspecified complications: Secondary | ICD-10-CM

## 2016-10-03 DIAGNOSIS — N170 Acute kidney failure with tubular necrosis: Secondary | ICD-10-CM

## 2016-10-03 DIAGNOSIS — R748 Abnormal levels of other serum enzymes: Secondary | ICD-10-CM

## 2016-10-03 LAB — GLUCOSE, CAPILLARY
Glucose-Capillary: 162 mg/dL — ABNORMAL HIGH (ref 65–99)
Glucose-Capillary: 125 mg/dL — ABNORMAL HIGH (ref 65–99)
Glucose-Capillary: 97 mg/dL (ref 65–99)
Glucose-Capillary: 235 mg/dL — ABNORMAL HIGH (ref 65–99)
Glucose-Capillary: 133 mg/dL — ABNORMAL HIGH (ref 65–99)

## 2016-10-03 LAB — BASIC METABOLIC PANEL
Anion gap: 9 (ref 5–15)
Anion gap: 9 (ref 5–15)
BUN: 42 mg/dL — ABNORMAL HIGH (ref 6–20)
BUN: 41 mg/dL — ABNORMAL HIGH (ref 6–20)
CO2: 14 mmol/L — ABNORMAL LOW (ref 22–32)
CO2: 13 mmol/L — ABNORMAL LOW (ref 22–32)
Calcium: 7.7 mg/dL — ABNORMAL LOW (ref 8.9–10.3)
Calcium: 7.5 mg/dL — ABNORMAL LOW (ref 8.9–10.3)
Chloride: 112 mmol/L — ABNORMAL HIGH (ref 101–111)
Chloride: 111 mmol/L (ref 101–111)
Creatinine, Ser: 5.34 mg/dL — ABNORMAL HIGH (ref 0.61–1.24)
Creatinine, Ser: 4.99 mg/dL — ABNORMAL HIGH (ref 0.61–1.24)
GFR calc Af Amer: 11 mL/min — ABNORMAL LOW (ref >60)
GFR calc Af Amer: 12 mL/min — ABNORMAL LOW (ref >60)
GFR calc non Af Amer: 10 mL/min — ABNORMAL LOW (ref >60)
GFR calc non Af Amer: 11 mL/min — ABNORMAL LOW (ref >60)
Glucose, Bld: 119 mg/dL — ABNORMAL HIGH (ref 65–99)
Glucose, Bld: 246 mg/dL — ABNORMAL HIGH (ref 65–99)
Potassium: 4 mmol/L (ref 3.5–5.1)
Potassium: 4.6 mmol/L (ref 3.5–5.1)
Sodium: 135 mmol/L (ref 135–145)
Sodium: 133 mmol/L — ABNORMAL LOW (ref 135–145)

## 2016-10-03 LAB — CK: Total CK: 697 U/L — ABNORMAL HIGH (ref 49–397)

## 2016-10-03 LAB — URINE CULTURE: Culture: NO GROWTH

## 2016-10-03 LAB — PROCALCITONIN: Procalcitonin: 55.59 ng/mL

## 2016-10-03 MED ORDER — SODIUM BICARBONATE 650 MG PO TABS
650.0000 mg | ORAL_TABLET | Freq: Two times a day (BID) | ORAL | Status: DC
  Administered 2016-10-03 – 2016-10-06 (×6): 650 mg via ORAL
  Filled 2016-10-03 (×6): qty 1

## 2016-10-03 MED ORDER — INSULIN ASPART 100 UNIT/ML ~~LOC~~ SOLN
0.0000 [IU] | Freq: Three times a day (TID) | SUBCUTANEOUS | Status: DC
  Administered 2016-10-04: 3 [IU] via SUBCUTANEOUS

## 2016-10-03 NOTE — Consult Note (Signed)
[facility] Renal Consultation Note  Requesting MD: Sherral Hammers Indication for Consultation: AKI  HPI:  [name] [name] is a 70 y.o. male with past medical history significant for poorly controlled diabetes mellitus, hypertension as well as a history of CVA. He has had several admissions to the hospital over the last 6 months usually with hyperglycemia but also with a hip fracture in September. It seems his usual pattern is he comes in with acute kidney injury that resolves at the time of discharge. Last creatinine known was on 11/17 and was 1.12. This was after a hospitalization for hypoglycemia and being found down. He is brought to the emergency room again on 12/20 after being found down at home. This time sugar was high. Drug screen was positive for opiates and CK was 1200. Presenting creatinine was 5.1. Initially it seemed that kidney function was improving with creatinine down to 4.0 on 1221 but then it went up again, peaked at 5.34 and is 4.9 this afternoon. Urine output has been less than 500 mL daily so he is a total of 7 L positive. Blood pressure had been low but is better now. Has had 275 of urine out in the last 4 hours. He is alert and not complaining of anything. He denies NSAID use prior to hospitalization. It does not appear he was on an ACE inhibitor  Creatinine, Ser  Date/Time Value Ref Range Status  10/03/2016 02:24 PM 4.99 (H) 0.61 - 1.24 mg/dL Final  10/03/2016 05:30 AM 5.34 (H) 0.61 - 1.24 mg/dL Final  10/02/2016 09:31 PM 5.01 (H) 0.61 - 1.24 mg/dL Final  10/02/2016 01:30 PM 4.60 (H) 0.61 - 1.24 mg/dL Final  10/02/2016 06:00 AM 4.21 (H) 0.61 - 1.24 mg/dL Final  10/02/2016 02:25 AM 4.09 (H) 0.61 - 1.24 mg/dL Final  10/01/2016 06:24 PM 4.42 (H) 0.61 - 1.24 mg/dL Final  10/01/2016 04:17 PM 4.80 (H) 0.61 - 1.24 mg/dL Final  10/01/2016 03:44 PM 5.14 (H) 0.61 - 1.24 mg/dL Final  08/19/2016 09:06 AM 1.12 0.61 - 1.24 mg/dL Final  08/18/2016 06:07 AM 1.51 (H) 0.61 - 1.24 mg/dL  Final  08/17/2016 10:34 AM 2.01 (H) 0.61 - 1.24 mg/dL Final  07/07/2016 08:12 AM 1.05 0.61 - 1.24 mg/dL Final  07/06/2016 07:06 AM 1.55 (H) 0.61 - 1.24 mg/dL Final  07/05/2016 09:20 PM 1.52 (H) 0.61 - 1.24 mg/dL Final  06/20/2016 04:54 AM 1.14 0.61 - 1.24 mg/dL Final  06/19/2016 04:21 AM 1.22 0.61 - 1.24 mg/dL Final  06/18/2016 05:30 AM 1.35 (H) 0.61 - 1.24 mg/dL Final  06/17/2016 04:50 AM 1.28 (H) 0.61 - 1.24 mg/dL Final  06/15/2016 05:21 AM 2.00 (H) 0.61 - 1.24 mg/dL Final  06/15/2016 05:11 AM 2.09 (H) 0.61 - 1.24 mg/dL Final  05/26/2016 12:08 PM 1.18 0.40 - 1.50 mg/dL Final  05/17/2016 03:35 AM 1.84 (H) 0.61 - 1.24 mg/dL Final  05/16/2016 11:39 AM 2.36 (H) 0.61 - 1.24 mg/dL Final  05/16/2016 07:59 AM 2.43 (H) 0.61 - 1.24 mg/dL Final  05/16/2016 03:37 AM 2.63 (H) 0.61 - 1.24 mg/dL Final  05/15/2016 11:58 PM 2.55 (H) 0.61 - 1.24 mg/dL Final  05/15/2016 05:31 PM 2.40 (H) 0.61 - 1.24 mg/dL Final  05/15/2016 05:16 PM 2.71 (H) 0.61 - 1.24 mg/dL Final  05/10/2016 05:45 AM 1.28 (H) 0.61 - 1.24 mg/dL Final  05/09/2016 03:30 AM 2.11 (H) 0.61 - 1.24 mg/dL Final  05/08/2016 10:19 PM 2.80 (H) 0.61 - 1.24 mg/dL Final  03/14/2016 04:23 PM 1.53 (H) 0.40 - 1.50 mg/dL  Final  11/03/2015 05:25 AM 1.06 0.61 - 1.24 mg/dL Final  11/02/2015 03:35 AM 0.88 0.61 - 1.24 mg/dL Final  11/01/2015 07:50 PM 0.85 0.61 - 1.24 mg/dL Final  11/01/2015 01:59 PM 0.96 0.61 - 1.24 mg/dL Final  09/27/2015 05:20 AM 0.73 0.61 - 1.24 mg/dL Final  09/26/2015 04:45 AM 0.77 0.61 - 1.24 mg/dL Final  09/25/2015 09:18 PM 1.34 (H) 0.61 - 1.24 mg/dL Final  08/24/2015 05:34 AM 1.28 (H) 0.61 - 1.24 mg/dL Final  08/23/2015 05:57 AM 1.88 (H) 0.61 - 1.24 mg/dL Final  08/22/2015 12:20 PM 2.75 (H) 0.61 - 1.24 mg/dL Final  08/22/2015 08:20 AM 2.74 (H) 0.61 - 1.24 mg/dL Final  08/22/2015 03:35 AM 2.96 (H) 0.61 - 1.24 mg/dL Final  08/22/2015 12:20 AM 3.00 (H) 0.61 - 1.24 mg/dL Final  08/21/2015 08:40 PM 3.28 (H) 0.61 - 1.24 mg/dL Final   08/21/2015 04:08 PM 3.26 (H) 0.61 - 1.24 mg/dL Final  08/21/2015 01:35 PM 3.39 (H) 0.61 - 1.24 mg/dL Final  08/13/2015 05:31 AM 1.60 (H) 0.61 - 1.24 mg/dL Final  08/13/2015 12:55 AM 1.36 (H) 0.61 - 1.24 mg/dL Final  08/12/2015 04:37 PM 1.60 (H) 0.61 - 1.24 mg/dL Final     PMHx:   Past Medical History:  Diagnosis Date  . 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 Closed comminuted intertrochanteric fracture of left femur ([place])   . Diabetes mellitus without complication ([place])   . Diabetic hyperosmolar non-ketotic state ([place]) 05/15/2016  . DKA (diabetic ketoacidoses) ([place]) 05/09/2016  . Hypertension   . Postoperative anemia due to acute blood loss 06/18/2016  . Retroperitoneal hematoma 06/18/2016  . Vitamin B 12 deficiency 06/18/2016    Past Surgical History:  Procedure Laterality Date  . HIP ARTHROPLASTY Right 02/05/2013   Procedure: ARTHROPLASTY BIPOLAR HIP;  Surgeon: [name], MD;  Location: [facility];  Service: Orthopedics;  Laterality: Right;  . INTRAMEDULLARY (IM) NAIL INTERTROCHANTERIC Left 06/16/2016   Procedure: INTRAMEDULLARY (IM) NAIL INTERTROCHANTRIC;  Surgeon: Rod Can, MD;  Location: [facility];  Service: Orthopedics;  Laterality: Left;    Family Hx:  Family History  Problem Relation Age of Onset  . Diabetes Mother   . Alzheimer's disease Mother   . Hypertension Mother   . Hyperlipidemia Father   . Hypertension Father   . Healthy Maternal Grandmother   . Pneumonia Maternal Grandfather     Social History:  reports that he quit smoking about 3 years ago. His smoking use included Cigarettes. He has a 7.50 pack-year smoking history. He has never used smokeless tobacco. He reports that he does not drink alcohol or use drugs.  Allergies: No Known Allergies  Medications: Prior to Admission medications   Medication Sig Start Date End Date Taking? Authorizing Provider  acetaminophen (TYLENOL) 325 MG tablet Take 2 tablets  (650 mg total) by mouth every 6 (six) hours as needed for mild pain (or Fever >/= 101). 08/14/15  Yes Robbie Lis, MD  amLODipine (NORVASC) 10 MG tablet Take 1 tablet (10 mg total) by mouth daily. 08/14/15  Yes Robbie Lis, MD  atorvastatin (LIPITOR) 10 MG tablet Take 1 tablet (10 mg total) by mouth daily at 6 PM. 03/23/15  Yes [name], DO  carvedilol (COREG) 6.25 MG tablet Take 1 tablet (6.25 mg total) by mouth 2 (two) times daily with a meal. 05/12/16  Yes Thurnell Lose, MD  cyanocobalamin (,VITAMIN B-12,) 1000 MCG/ML injection Inject 1 mL (1,000 mcg  total) into the muscle every 30 (thirty) days. 06/20/16  Yes Venetia Maxon Rama, MD  HYDROcodone-acetaminophen (NORCO/VICODIN) 5-325 MG tablet Take 1 tablet by mouth every 4 (four) hours as needed for severe pain. 08/19/16  Yes Costin Karlyne Greenspan, MD  insulin aspart (NOVOLOG FLEXPEN) 100 UNIT/ML FlexPen Inject 0-12 Units into the skin 3 (three) times daily with meals. Per sliding scale, 0-150 = 0 units, 151-200 = 2 units, 201-250 = 4 units, 251-300 = 6 units, 301-350 = 8 units, 351-400 = 10 units, 401-500 = 12 units (call MD) 08/19/16  Yes Costin Karlyne Greenspan, MD  insulin glargine (LANTUS) 100 UNIT/ML injection Inject 0.1 mLs (10 Units total) into the skin daily. 08/26/16  Yes Golden Circle, [name]  polyethylene glycol powder (GLYCOLAX/MIRALAX) powder Take 17 g by mouth every morning.  06/20/16  Yes Historical Provider, MD  rivaroxaban (XARELTO) 20 MG TABS tablet Take 1 tablet (20 mg total) by mouth daily with supper. STARTING October 16. 07/28/16  Yes Bonnielee Haff, MD  senna (SENOKOT) 8.6 MG TABS tablet Take 1 tablet (8.6 mg total) by mouth 2 (two) times daily. 06/20/16  Yes Venetia Maxon Rama, MD  thiamine 100 MG tablet Take 1 tablet (100 mg total) by mouth daily. 11/03/15  Yes Simbiso Ranga, MD    I have reviewed the patient's current medications.  Labs:  Results for orders placed or performed during the hospital encounter of 10/01/16 (from the past 48  hour(s))  Basic metabolic panel     Status: Abnormal   Collection Time: 10/01/16  6:24 PM  Result Value Ref Range   Sodium 138 135 - 145 mmol/L    Comment: DELTA CHECK NOTED   Potassium 4.2 3.5 - 5.1 mmol/L    Comment: DELTA CHECK NOTED   Chloride 111 101 - 111 mmol/L   CO2 11 (L) 22 - 32 mmol/L   Glucose, Bld 351 (H) 65 - 99 mg/dL   BUN 38 (H) 6 - 20 mg/dL   Creatinine, Ser 4.42 (H) 0.61 - 1.24 mg/dL   Calcium 8.8 (L) 8.9 - 10.3 mg/dL   GFR calc non Af Amer 12 (L) >60 mL/min   GFR calc Af Amer 14 (L) >60 mL/min    Comment: (NOTE) The eGFR has been calculated using the CKD EPI equation. This calculation has not been validated in all clinical situations. eGFR's persistently <60 mL/min signify possible Chronic Kidney Disease.    Anion gap 16 (H) 5 - 15  CK     Status: Abnormal   Collection Time: 10/01/16  6:24 PM  Result Value Ref Range   Total CK 1,214 (H) 49 - 397 U/L  Hepatic function panel     Status: Abnormal   Collection Time: 10/01/16  6:24 PM  Result Value Ref Range   Total Protein 6.8 6.5 - 8.1 g/dL   Albumin 3.6 3.5 - 5.0 g/dL   AST 101 (H) 15 - 41 U/L   ALT 49 17 - 63 U/L   Alkaline Phosphatase 138 (H) 38 - 126 U/L   Total Bilirubin 0.4 0.3 - 1.2 mg/dL   Bilirubin, Direct <0.1 (L) 0.1 - 0.5 mg/dL   Indirect Bilirubin NOT CALCULATED 0.3 - 0.9 mg/dL  CBG monitoring, ED     Status: Abnormal   Collection Time: 10/01/16  6:26 PM  Result Value Ref Range   Glucose-Capillary 588 (HH) 65 - 99 mg/dL   Comment 1 Notify RN   Culture, blood (routine x 2)     Status:  None (Preliminary result)   Collection Time: 10/01/16  6:28 PM  Result Value Ref Range   Specimen Description RIGHT ANTECUBITAL    Special Requests IN PEDIATRIC BOTTLE 3CC    Culture NO GROWTH 2 DAYS    Report Status PENDING   Culture, blood (routine x 2)     Status: None (Preliminary result)   Collection Time: 10/01/16  6:33 PM  Result Value Ref Range   Specimen Description BLOOD LEFT HAND    Special  Requests BOTTLES DRAWN AEROBIC ONLY 10CC    Culture NO GROWTH 2 DAYS    Report Status PENDING   Lactic acid, plasma     Status: Abnormal   Collection Time: 10/01/16  7:10 PM  Result Value Ref Range   Lactic Acid, Venous 7.1 (HH) 0.5 - 1.9 mmol/L    Comment: CRITICAL RESULT CALLED TO, READ BACK BY AND VERIFIED WITH: [facility] L,RN 10/01/16 2215 WAYK   Procalcitonin - Baseline     Status: None   Collection Time: 10/01/16  7:10 PM  Result Value Ref Range   Procalcitonin 31.89 ng/mL    Comment:        Interpretation: PCT >= 10 ng/mL: Important systemic inflammatory response, almost exclusively due to severe bacterial sepsis or septic shock. (NOTE)         ICU PCT Algorithm               Non ICU PCT Algorithm    ----------------------------     ------------------------------         PCT < 0.25 ng/mL                 PCT < 0.1 ng/mL     Stopping of antibiotics            Stopping of antibiotics       strongly encouraged.               strongly encouraged.    ----------------------------     ------------------------------       PCT level decrease by               PCT < 0.25 ng/mL       >= 80% from peak PCT       OR PCT 0.25 - 0.5 ng/mL          Stopping of antibiotics                                             encouraged.     Stopping of antibiotics           encouraged.    ----------------------------     ------------------------------       PCT level decrease by              PCT >= 0.25 ng/mL       < 80% from peak PCT        AND PCT >= 0.5 ng/mL             Continuing antibiotics                                              encouraged.       Continuing antibiotics  encouraged.    ----------------------------     ------------------------------     PCT level increase compared          PCT > 0.5 ng/mL         with peak PCT AND          PCT >= 0.5 ng/mL             Escalation of antibiotics                                          strongly encouraged.      Escalation of  antibiotics        strongly encouraged.   Troponin I (q 6hr x 3)     Status: Abnormal   Collection Time: 10/01/16  7:21 PM  Result Value Ref Range   Troponin I 0.05 (HH) <0.03 ng/mL    Comment: CRITICAL VALUE NOTED.  VALUE IS CONSISTENT WITH PREVIOUSLY REPORTED AND CALLED VALUE.  CBG monitoring, ED     Status: Abnormal   Collection Time: 10/01/16  7:30 PM  Result Value Ref Range   Glucose-Capillary 499 (H) 65 - 99 mg/dL  MRSA PCR Screening     Status: None   Collection Time: 10/01/16  8:12 PM  Result Value Ref Range   MRSA by PCR NEGATIVE NEGATIVE    Comment:        The GeneXpert MRSA Assay (FDA approved for NASAL specimens only), is one component of a comprehensive MRSA colonization surveillance program. It is not intended to diagnose MRSA infection nor to guide or monitor treatment for MRSA infections.   Glucose, capillary     Status: Abnormal   Collection Time: 10/01/16  8:35 PM  Result Value Ref Range   Glucose-Capillary 343 (H) 65 - 99 mg/dL   Comment 1 Document in Chart   Glucose, capillary     Status: Abnormal   Collection Time: 10/01/16  9:36 PM  Result Value Ref Range   Glucose-Capillary 321 (H) 65 - 99 mg/dL   Comment 1 Document in Chart   Urinalysis, Routine w reflex microscopic     Status: Abnormal   Collection Time: 10/01/16 10:05 PM  Result Value Ref Range   Color, Urine YELLOW YELLOW   APP[place]ance HAZY (A) CLEAR   Specific Gravity, Urine 1.009 1.005 - 1.030   pH 5.0 5.0 - 8.0   Glucose, UA >=500 (A) NEGATIVE mg/dL   Hgb urine dipstick LARGE (A) NEGATIVE   Bilirubin Urine NEGATIVE NEGATIVE   Ketones, ur 5 (A) NEGATIVE mg/dL   Protein, ur 100 (A) NEGATIVE mg/dL   Nitrite NEGATIVE NEGATIVE   Leukocytes, UA NEGATIVE NEGATIVE   RBC / HPF 0-5 0 - 5 RBC/hpf   WBC, UA 6-30 0 - 5 WBC/hpf   Bacteria, UA RARE (A) NONE SEEN   Squamous Epithelial / LPF 0-5 (A) NONE SEEN   Mucous PRESENT    Hyaline Casts, UA PRESENT    Granular Casts, UA PRESENT   Urine  culture     Status: None   Collection Time: 10/01/16 10:05 PM  Result Value Ref Range   Specimen Description URINE, CATHETERIZED    Special Requests NONE    Culture NO GROWTH    Report Status 10/03/2016 FINAL   Urine rapid drug screen (hosp performed)     Status: Abnormal   Collection Time: 10/01/16 10:05 PM  Result  Value Ref Range   Opiates POSITIVE (A) NONE DETECTED   Cocaine NONE DETECTED NONE DETECTED   Benzodiazepines NONE DETECTED NONE DETECTED   Amphetamines NONE DETECTED NONE DETECTED   Tetrahydrocannabinol NONE DETECTED NONE DETECTED   Barbiturates NONE DETECTED NONE DETECTED    Comment:        DRUG SCREEN FOR MEDICAL PURPOSES ONLY.  IF CONFIRMATION IS NEEDED FOR ANY PURPOSE, NOTIFY LAB WITHIN 5 DAYS.        LOWEST DETECTABLE LIMITS FOR URINE DRUG SCREEN Drug Class       Cutoff (ng/mL) Amphetamine      1000 Barbiturate      200 Benzodiazepine   638 Tricyclics       466 Opiates          300 Cocaine          300 THC              50   Glucose, capillary     Status: Abnormal   Collection Time: 10/01/16 10:36 PM  Result Value Ref Range   Glucose-Capillary 223 (H) 65 - 99 mg/dL   Comment 1 Document in Chart   Glucose, capillary     Status: Abnormal   Collection Time: 10/01/16 11:35 PM  Result Value Ref Range   Glucose-Capillary 147 (H) 65 - 99 mg/dL   Comment 1 Document in Chart   Glucose, capillary     Status: Abnormal   Collection Time: 10/02/16 12:29 AM  Result Value Ref Range   Glucose-Capillary 160 (H) 65 - 99 mg/dL   Comment 1 Document in Chart   Glucose, capillary     Status: Abnormal   Collection Time: 10/02/16  1:34 AM  Result Value Ref Range   Glucose-Capillary 122 (H) 65 - 99 mg/dL   Comment 1 Document in Chart   Lactic acid, plasma     Status: Abnormal   Collection Time: 10/02/16  1:42 AM  Result Value Ref Range   Lactic Acid, Venous 4.2 (HH) 0.5 - 1.9 mmol/L    Comment: CRITICAL RESULT CALLED TO, READ BACK BY AND VERIFIED WITH: HAYES C,RN  10/02/16 0222 WAYK   Basic metabolic panel     Status: Abnormal   Collection Time: 10/02/16  2:25 AM  Result Value Ref Range   Sodium 138 135 - 145 mmol/L   Potassium 3.9 3.5 - 5.1 mmol/L   Chloride 114 (H) 101 - 111 mmol/L   CO2 16 (L) 22 - 32 mmol/L   Glucose, Bld 72 65 - 99 mg/dL   BUN 36 (H) 6 - 20 mg/dL   Creatinine, Ser 4.09 (H) 0.61 - 1.24 mg/dL   Calcium 8.2 (L) 8.9 - 10.3 mg/dL   GFR calc non Af Amer 13 (L) >60 mL/min   GFR calc Af Amer 16 (L) >60 mL/min    Comment: (NOTE) The eGFR has been calculated using the CKD EPI equation. This calculation has not been validated in all clinical situations. eGFR's persistently <60 mL/min signify possible Chronic Kidney Disease.    Anion gap 8 5 - 15  Troponin I (q 6hr x 3)     Status: Abnormal   Collection Time: 10/02/16  2:25 AM  Result Value Ref Range   Troponin I 0.05 (HH) <0.03 ng/mL    Comment: CRITICAL VALUE NOTED.  VALUE IS CONSISTENT WITH PREVIOUSLY REPORTED AND CALLED VALUE.  Glucose, capillary     Status: None   Collection Time: 10/02/16  2:32 AM  Result Value  Ref Range   Glucose-Capillary 98 65 - 99 mg/dL   Comment 1 Document in Chart   Glucose, capillary     Status: None   Collection Time: 10/02/16  3:30 AM  Result Value Ref Range   Glucose-Capillary 80 65 - 99 mg/dL   Comment 1 Document in Chart   Glucose, capillary     Status: None   Collection Time: 10/02/16  4:30 AM  Result Value Ref Range   Glucose-Capillary 97 65 - 99 mg/dL   Comment 1 Document in Chart   Glucose, capillary     Status: Abnormal   Collection Time: 10/02/16  5:28 AM  Result Value Ref Range   Glucose-Capillary 105 (H) 65 - 99 mg/dL   Comment 1 Document in Chart   Basic metabolic panel     Status: Abnormal   Collection Time: 10/02/16  6:00 AM  Result Value Ref Range   Sodium 136 135 - 145 mmol/L   Potassium 4.3 3.5 - 5.1 mmol/L   Chloride 113 (H) 101 - 111 mmol/L   CO2 14 (L) 22 - 32 mmol/L   Glucose, Bld 193 (H) 65 - 99 mg/dL   BUN  37 (H) 6 - 20 mg/dL   Creatinine, Ser 4.21 (H) 0.61 - 1.24 mg/dL   Calcium 7.9 (L) 8.9 - 10.3 mg/dL   GFR calc non Af Amer 13 (L) >60 mL/min   GFR calc Af Amer 15 (L) >60 mL/min    Comment: (NOTE) The eGFR has been calculated using the CKD EPI equation. This calculation has not been validated in all clinical situations. eGFR's persistently <60 mL/min signify possible Chronic Kidney Disease.    Anion gap 9 5 - 15  CBC     Status: Abnormal   Collection Time: 10/02/16  6:00 AM  Result Value Ref Range   WBC 9.9 4.0 - 10.5 K/uL   RBC 3.15 (L) 4.22 - 5.81 MIL/uL   Hemoglobin 9.6 (L) 13.0 - 17.0 g/dL    Comment: REPEATED TO VERIFY RESULT CALLED TO, READ BACK BY AND VERIFIED WITH: Vic Blackbird RN [id number] 10/02/2016 BY MACEDA, J    HCT 28.7 (L) 39.0 - 52.0 %   MCV 91.1 78.0 - 100.0 fL    Comment: REPEATED TO VERIFY RESULT CALLED TO, READ BACK BY AND VERIFIED WITH: Vic Blackbird RN [phone] 10/02/2016 BY MACEDA,J. PT DRAWN FROM LINE.    MCH 30.5 26.0 - 34.0 pg   MCHC 33.4 30.0 - 36.0 g/dL   RDW 14.3 11.5 - 15.5 %   Platelets 210 150 - 400 K/uL  Magnesium     Status: None   Collection Time: 10/02/16  6:00 AM  Result Value Ref Range   Magnesium 1.9 1.7 - 2.4 mg/dL  Phosphorus     Status: Abnormal   Collection Time: 10/02/16  6:00 AM  Result Value Ref Range   Phosphorus 5.2 (H) 2.5 - 4.6 mg/dL  Procalcitonin     Status: None   Collection Time: 10/02/16  6:00 AM  Result Value Ref Range   Procalcitonin 45.48 ng/mL    Comment:        Interpretation: PCT >= 10 ng/mL: Important systemic inflammatory response, almost exclusively due to severe bacterial sepsis or septic shock. (NOTE)         ICU PCT Algorithm               Non ICU PCT Algorithm    ----------------------------     ------------------------------  PCT < 0.25 ng/mL                 PCT < 0.1 ng/mL     Stopping of antibiotics            Stopping of antibiotics       strongly encouraged.               strongly  encouraged.    ----------------------------     ------------------------------       PCT level decrease by               PCT < 0.25 ng/mL       >= 80% from peak PCT       OR PCT 0.25 - 0.5 ng/mL          Stopping of antibiotics                                             encouraged.     Stopping of antibiotics           encouraged.    ----------------------------     ------------------------------       PCT level decrease by              PCT >= 0.25 ng/mL       < 80% from peak PCT        AND PCT >= 0.5 ng/mL             Continuing antibiotics                                              encouraged.       Continuing antibiotics            encouraged.    ----------------------------     ------------------------------     PCT level increase compared          PCT > 0.5 ng/mL         with peak PCT AND          PCT >= 0.5 ng/mL             Escalation of antibiotics                                          strongly encouraged.      Escalation of antibiotics        strongly encouraged.   Hepatic function panel     Status: Abnormal   Collection Time: 10/02/16  6:00 AM  Result Value Ref Range   Total Protein 5.0 (L) 6.5 - 8.1 g/dL   Albumin 2.5 (L) 3.5 - 5.0 g/dL   AST 70 (H) 15 - 41 U/L   ALT 37 17 - 63 U/L   Alkaline Phosphatase 96 38 - 126 U/L   Total Bilirubin 0.5 0.3 - 1.2 mg/dL   Bilirubin, Direct <0.1 (L) 0.1 - 0.5 mg/dL   Indirect Bilirubin NOT CALCULATED 0.3 - 0.9 mg/dL  Glucose, capillary     Status: Abnormal   Collection Time: 10/02/16  6:32 AM  Result Value Ref Range   Glucose-Capillary 147 (H) 65 -  99 mg/dL   Comment 1 Document in Chart   Troponin I (q 6hr x 3)     Status: Abnormal   Collection Time: 10/02/16  7:21 AM  Result Value Ref Range   Troponin I 0.03 (HH) <0.03 ng/mL    Comment: CRITICAL VALUE NOTED.  VALUE IS CONSISTENT WITH PREVIOUSLY REPORTED AND CALLED VALUE.  Glucose, capillary     Status: Abnormal   Collection Time: 10/02/16  7:22 AM  Result Value Ref  Range   Glucose-Capillary 197 (H) 65 - 99 mg/dL   Comment 1 Notify RN   Glucose, capillary     Status: Abnormal   Collection Time: 10/02/16  8:05 AM  Result Value Ref Range   Glucose-Capillary 152 (H) 65 - 99 mg/dL   Comment 1 Notify RN   Glucose, capillary     Status: Abnormal   Collection Time: 10/02/16  8:56 AM  Result Value Ref Range   Glucose-Capillary 132 (H) 65 - 99 mg/dL   Comment 1 Notify RN   Glucose, capillary     Status: Abnormal   Collection Time: 10/02/16 10:07 AM  Result Value Ref Range   Glucose-Capillary 121 (H) 65 - 99 mg/dL   Comment 1 Notify RN   Glucose, capillary     Status: Abnormal   Collection Time: 10/02/16 11:12 AM  Result Value Ref Range   Glucose-Capillary 158 (H) 65 - 99 mg/dL   Comment 1 Notify RN   Glucose, capillary     Status: Abnormal   Collection Time: 10/02/16 11:55 AM  Result Value Ref Range   Glucose-Capillary 201 (H) 65 - 99 mg/dL   Comment 1 Notify RN   Glucose, capillary     Status: Abnormal   Collection Time: 10/02/16  1:26 PM  Result Value Ref Range   Glucose-Capillary 290 (H) 65 - 99 mg/dL   Comment 1 Notify RN   Basic metabolic panel     Status: Abnormal   Collection Time: 10/02/16  1:30 PM  Result Value Ref Range   Sodium 135 135 - 145 mmol/L   Potassium 4.6 3.5 - 5.1 mmol/L   Chloride 111 101 - 111 mmol/L   CO2 11 (L) 22 - 32 mmol/L   Glucose, Bld 394 (H) 65 - 99 mg/dL   BUN 40 (H) 6 - 20 mg/dL   Creatinine, Ser 4.60 (H) 0.61 - 1.24 mg/dL   Calcium 7.8 (L) 8.9 - 10.3 mg/dL   GFR calc non Af Amer 12 (L) >60 mL/min   GFR calc Af Amer 13 (L) >60 mL/min    Comment: (NOTE) The eGFR has been calculated using the CKD EPI equation. This calculation has not been validated in all clinical situations. eGFR's persistently <60 mL/min signify possible Chronic Kidney Disease.    Anion gap 13 5 - 15  Glucose, capillary     Status: Abnormal   Collection Time: 10/02/16  1:49 PM  Result Value Ref Range   Glucose-Capillary 282 (H) 65  - 99 mg/dL   Comment 1 Notify RN   Glucose, capillary     Status: Abnormal   Collection Time: 10/02/16  3:28 PM  Result Value Ref Range   Glucose-Capillary 237 (H) 65 - 99 mg/dL   Comment 1 Notify RN   Glucose, capillary     Status: Abnormal   Collection Time: 10/02/16  4:10 PM  Result Value Ref Range   Glucose-Capillary 185 (H) 65 - 99 mg/dL   Comment 1 Notify RN   Glucose,  capillary     Status: Abnormal   Collection Time: 10/02/16  5:55 PM  Result Value Ref Range   Glucose-Capillary 142 (H) 65 - 99 mg/dL   Comment 1 Notify RN   Glucose, capillary     Status: Abnormal   Collection Time: 10/02/16  7:49 PM  Result Value Ref Range   Glucose-Capillary 147 (H) 65 - 99 mg/dL   Comment 1 Notify RN    Comment 2 Document in Chart   Basic metabolic panel     Status: Abnormal   Collection Time: 10/02/16  9:31 PM  Result Value Ref Range   Sodium 135 135 - 145 mmol/L   Potassium 4.0 3.5 - 5.1 mmol/L   Chloride 112 (H) 101 - 111 mmol/L   CO2 14 (L) 22 - 32 mmol/L   Glucose, Bld 91 65 - 99 mg/dL   BUN 41 (H) 6 - 20 mg/dL   Creatinine, Ser 5.01 (H) 0.61 - 1.24 mg/dL   Calcium 7.7 (L) 8.9 - 10.3 mg/dL   GFR calc non Af Amer 10 (L) >60 mL/min   GFR calc Af Amer 12 (L) >60 mL/min    Comment: (NOTE) The eGFR has been calculated using the CKD EPI equation. This calculation has not been validated in all clinical situations. eGFR's persistently <60 mL/min signify possible Chronic Kidney Disease.    Anion gap 9 5 - 15  Glucose, capillary     Status: None   Collection Time: 10/02/16 11:35 PM  Result Value Ref Range   Glucose-Capillary 80 65 - 99 mg/dL   Comment 1 Notify RN    Comment 2 Document in Chart   Glucose, capillary     Status: Abnormal   Collection Time: 10/03/16  3:37 AM  Result Value Ref Range   Glucose-Capillary 162 (H) 65 - 99 mg/dL   Comment 1 Notify RN    Comment 2 Document in Chart   Procalcitonin     Status: None   Collection Time: 10/03/16  5:30 AM  Result Value Ref  Range   Procalcitonin 55.59 ng/mL    Comment:        Interpretation: PCT >= 10 ng/mL: Important systemic inflammatory response, almost exclusively due to severe bacterial sepsis or septic shock. (NOTE)         ICU PCT Algorithm               Non ICU PCT Algorithm    ----------------------------     ------------------------------         PCT < 0.25 ng/mL                 PCT < 0.1 ng/mL     Stopping of antibiotics            Stopping of antibiotics       strongly encouraged.               strongly encouraged.    ----------------------------     ------------------------------       PCT level decrease by               PCT < 0.25 ng/mL       >= 80% from peak PCT       OR PCT 0.25 - 0.5 ng/mL          Stopping of antibiotics  encouraged.     Stopping of antibiotics           encouraged.    ----------------------------     ------------------------------       PCT level decrease by              PCT >= 0.25 ng/mL       < 80% from peak PCT        AND PCT >= 0.5 ng/mL             Continuing antibiotics                                              encouraged.       Continuing antibiotics            encouraged.    ----------------------------     ------------------------------     PCT level increase compared          PCT > 0.5 ng/mL         with peak PCT AND          PCT >= 0.5 ng/mL             Escalation of antibiotics                                          strongly encouraged.      Escalation of antibiotics        strongly encouraged.   Basic metabolic panel     Status: Abnormal   Collection Time: 10/03/16  5:30 AM  Result Value Ref Range   Sodium 135 135 - 145 mmol/L   Potassium 4.0 3.5 - 5.1 mmol/L   Chloride 112 (H) 101 - 111 mmol/L   CO2 14 (L) 22 - 32 mmol/L   Glucose, Bld 119 (H) 65 - 99 mg/dL   BUN 42 (H) 6 - 20 mg/dL   Creatinine, Ser 5.34 (H) 0.61 - 1.24 mg/dL   Calcium 7.7 (L) 8.9 - 10.3 mg/dL   GFR calc non Af Amer 10 (L)  >60 mL/min   GFR calc Af Amer 11 (L) >60 mL/min    Comment: (NOTE) The eGFR has been calculated using the CKD EPI equation. This calculation has not been validated in all clinical situations. eGFR's persistently <60 mL/min signify possible Chronic Kidney Disease.    Anion gap 9 5 - 15  Glucose, capillary     Status: Abnormal   Collection Time: 10/03/16  8:17 AM  Result Value Ref Range   Glucose-Capillary 125 (H) 65 - 99 mg/dL   Comment 1 Document in Chart   Glucose, capillary     Status: None   Collection Time: 10/03/16 12:06 PM  Result Value Ref Range   Glucose-Capillary 97 65 - 99 mg/dL   Comment 1 Document in Chart   Basic metabolic panel     Status: Abnormal   Collection Time: 10/03/16  2:24 PM  Result Value Ref Range   Sodium 133 (L) 135 - 145 mmol/L   Potassium 4.6 3.5 - 5.1 mmol/L   Chloride 111 101 - 111 mmol/L   CO2 13 (L) 22 - 32 mmol/L   Glucose, Bld 246 (H) 65 - 99 mg/dL   BUN 41 (H) 6 - 20  mg/dL   Creatinine, Ser 4.99 (H) 0.61 - 1.24 mg/dL   Calcium 7.5 (L) 8.9 - 10.3 mg/dL   GFR calc non Af Amer 11 (L) >60 mL/min   GFR calc Af Amer 12 (L) >60 mL/min    Comment: (NOTE) The eGFR has been calculated using the CKD EPI equation. This calculation has not been validated in all clinical situations. eGFR's persistently <60 mL/min signify possible Chronic Kidney Disease.    Anion gap 9 5 - 15  Glucose, capillary     Status: Abnormal   Collection Time: 10/03/16  3:56 PM  Result Value Ref Range   Glucose-Capillary 235 (H) 65 - 99 mg/dL   Comment 1 Document in Chart      ROS:  A comprehensive review of systems was negative except for: Cardiovascular: positive for lower extremity edema  Physical Exam: Vitals:   10/03/16 1555 10/03/16 1647  BP:  (!) 122/59  Pulse:  (!) 56  Resp:  16  Temp: 97.4 F (36.3 C) 98.2 F (36.8 C)     General: Black male who is alert, perhaps a little slow mentally in no acute distress HEENT: Pupils are equal round reactive to  light, extra ocular motions are intact, mucous members are moist Neck: There is JVD Heart: Bradycardic in the 50s Lungs: Mostly clear Abdomen: Soft and nontender Extremities: Pitting edema to the thighs Skin: Warm and dry Neuro: Alert but slow. Foley catheter in place  Assessment/Plan: 70 year old chronically ill black male with brittle diabetes mellitus and multiple hospital admissions for hypo-and hyperglycemia. This time presented with hyperglycemia, elevated CK and acute kidney injury 1.Renal- acute kidney injury with creatinine on 11/7 of 1.12. His acute kidney injury has been slow to resolve and he has been oliguric.  His blood pressures also been low. I suspect his acute kidney injury is from rhabdo as well as ATN from hypotension.  Initial urinalysis showed many white blood cells. I will recheck urinalysis. I will also recheck CK. Fortunately, there are no indications for dialysis.  It does appear that urine output is picking up. Hopefully this means that his kidneys are trying to turn the corner. We'll continue to observe. Hopefully with kidney function normal 6 weeks ago , he will turn the corner and not require dialysis 2. Hypertension/volume  - patient may have been dry upon admission. He is now 7 L positive and overloaded. Because his blood pressures better I will discontinue IV fluids 3. Metabolic acidosis- will give sodium bicarbonate 4. Anemia  - anemia has worsened with hydration  Thank you for this consultation. We will continue to follow with you   Ommie Degeorge A 10/03/2016, 5:10 PM

## 2016-10-03 NOTE — Evaluation (Signed)
Physical Therapy Evaluation Patient Details Name: [name] [name] MRN: [id number] DOB: [birth year] Today's Date: 10/03/2016   History of Present Illness  Patient is a 70 year old male admitted 10/01/16 with hyperglycemia and altered mental status, determined to be DKA. PMH includes CVA with left weakness 2016, DM, DKA 04/2016, previous DVT, CKD stage III, HTN, RLE THA, and hypotension upon admission.   Clinical Impression  Patient presents with decreased bilateral LE strength, balance, mobility, and activity tolerance, and patient also has slight cognitive deficits with short term memory. Patient will benefit from physical therapy to address these deficits and maximize function. Patient has a good prognosis to address these deficits with PT . Patient was educated on discharge planning, and although he would like to return home, he understands the SNF recommendation. Patient tolerated ambulation well, and will continue to benefit from progressed mobility. Will continue to follow.   HR during session: 70-92 SPO2: 99-100% on room air (when reading correctly)  BP preambulation: 104/66 BP postambulation: 105/55     Follow Up Recommendations SNF;Supervision for mobility/OOB    Equipment Recommendations  None recommended by PT    Recommendations for Other Services       Precautions / Restrictions Precautions Precautions: Fall Restrictions Weight Bearing Restrictions: No      Mobility  Bed Mobility Overal bed mobility: Needs Assistance Bed Mobility: Supine to Sit     Supine to sit: Min guard;Min assist     General bed mobility comments: patient required cuing to move to EOB, and physical assistance with pelvic movement/scooting to EOB.   Transfers Overall transfer level: Needs assistance Equipment used: Rolling walker (2 wheeled) Transfers: Sit to/from Stand Sit to Stand: Mod assist         General transfer comment: patient cued for hand placement. mod assist for power up, trunk  elevation, and steadying upon standing.   Ambulation/Gait Ambulation/Gait assistance: +2 safety/equipment;Min assist (chair follow, not necessary ) Ambulation Distance (Feet): 160 Feet Assistive device: Rolling walker (2 wheeled) Gait Pattern/deviations: Step-through pattern;Decreased stride length;Narrow base of support Gait velocity: decreased  Gait velocity interpretation: Below normal speed for age/gender General Gait Details: patient reported feeling like he was scissoring during ambulation, but none noted by PT. Min guard for safety, steadying.   Stairs            Wheelchair Mobility    Modified Rankin (Stroke Patients Only)       Balance Overall balance assessment: Needs assistance Sitting-balance support: Feet supported Sitting balance-Leahy Scale: Good Sitting balance - Comments: sits EOB independently    Standing balance support: Bilateral upper extremity supported Standing balance-Leahy Scale: Fair Standing balance comment: relied on RW for support.                              Pertinent Vitals/Pain Pain Assessment: No/denies pain    Home Living Family/patient expects to be discharged to:: Private residence Living Arrangements: Children (daughter ) Available Help at Discharge: Family;Available PRN/intermittently Type of Home: Apartment Home Access: Level entry     Home Layout: One level Home Equipment: Walker - 2 wheels Additional Comments: patient does not drive and relies on daughter for this. Patient's daughter works so is available intermittently.     Prior Function Level of Independence: Needs assistance   Gait / Transfers Assistance Needed: ambulates with RW according to previous note 11/17, but patient reports using no AD at home.   ADL's / Homemaking Assistance Needed:  daughter helps with household activities. Pt bathes and dresses himself. Unsure of patient's accuracy.   Comments: reports no falls, but previous note from 11/17  states he has a hx of falls      Hand Dominance   Dominant Hand: Right    Extremity/Trunk Assessment   Upper Extremity Assessment Upper Extremity Assessment: Overall WFL for tasks assessed    Lower Extremity Assessment Lower Extremity Assessment: RLE deficits/detail;LLE deficits/detail RLE Deficits / Details: Hip flexion 3/5, knee flexion/extension 4/5, hip abduction and adduction WFL  LLE Deficits / Details: Hip flexion 3/5, knee flexion/extension 4/5, hip abduction and adduction WFL     Cervical / Trunk Assessment Cervical / Trunk Assessment: Normal  Communication   Communication: No difficulties  Cognition Arousal/Alertness: Awake/alert Behavior During Therapy: Flat affect Overall Cognitive Status: Impaired/Different from baseline Area of Impairment: Orientation;Memory Orientation Level: Time   Memory: Decreased short-term memory              General Comments      Exercises     Assessment/Plan    PT Assessment Patient needs continued PT services  PT Problem List Decreased strength;Decreased activity tolerance;Decreased balance;Decreased mobility;Decreased coordination;Decreased cognition          PT Treatment Interventions DME instruction;Gait training;Functional mobility training;Therapeutic activities;Therapeutic exercise;Balance training;Patient/family education    PT Goals (Current goals can be found in the Care Plan section)  Acute Rehab PT Goals Patient Stated Goal: progress mobility  PT Goal Formulation: With patient Time For Goal Achievement: 10/17/16 Potential to Achieve Goals: Good    Frequency Min 3X/week   Barriers to discharge  (unsure of daughter's assist level in the home )      Co-evaluation               End of Session Equipment Utilized During Treatment: Gait belt Activity Tolerance: Patient tolerated treatment well Patient left: in chair;with chair alarm set;with call bell/phone within reach Nurse Communication:  Mobility status         Time: 0931-1216 PT Time Calculation (min) (ACUTE ONLY): 37 min   Charges:   PT Evaluation $PT Eval Moderate Complexity: 1 Procedure PT Treatments $Gait Training: 8-22 mins   PT G Codes:        Burgess Sheriff Oct 13, 2016, 11:07 AM  Franziska Podgurski SPT [phone]

## 2016-10-03 NOTE — Progress Notes (Signed)
PROGRESS NOTE    [name] [name]  FGH:[id number] DOB: Dec 17, 1945 DOA: 10/01/2016 PCP: Mauricio Po, [name]   Brief Narrative:  70 year old BM PMHx CVA Rt posterior cerebral artery (2016), DM type II uncontrolled with complications, Noncompliant with insulin,  - recent admission in July 2017 for DKA, HTN, DVT (06/2016) on Xarelto,Retroperitoneal hematoma, Vitamin B 12 deficiency and CKD III.   Presented to ED 12/20 after being found down by daughter at home. Daughter stated that she gave 20 units of regular insulin about 1 hour before his arrival. Per EMS blood glucose was 500. Upon arrival to ED patient was lethargic but able to respond approprietely to questions. ABG 7.024/19/98. Glucose 1,1134. PCCM called to [place].   Recent hospitalization 11/5-11/7 for hypoglycemia and Rhabdomyolysis after being found down by daughter with a glucose level of 37.     Subjective: 12/22 A/O 4, NAD, admits to not taking his medication as prescribed   Assessment & Plan:   Active Problems:   Diabetic acidosis without coma ([name])   Increased WOB secondary to DKA -resolved -Maintain saturation >92  HTN/Hypotension,  -Hold Home HTN medications  Dc xarelto  Hypotension  -Resolved  Elevated Trop - (Prob Demand Ischemia)  HLD  DVT of Left Posterial Tibial Vein (on Xarelto has been treated since Sep) -resolved   Acute on CKD III (Base Crt 1.2-2) -Renal ultrasound pending -Nephrology consulted spoke with Dr. Justin Mend will see on 12/23  DKA/DM type 2 uncontrolled with complications  -slight high Beta-hydroxybutyric acid confirms -Noncompliant DM medication -Lantus 10 units -Moderate SSI   Acute Metabolic Encephalopathy  -resolved    DVT prophylaxis: Subcutaneous heparin Code Status: Full Family Communication: None Disposition Plan: Resolution renal failure   Consultants:  Dr. Justin Mend Nephrology pending    Procedures/Significant Events:  Echo 06/2016 > EF 60-65%, with normal function    CT Head 12/20 > No acute  CT C Spine 12/20 > No acute  Duplex 12/21 neg   VENTILATOR SETTINGS: NA   Cultures Blood 12/20 > Urine 12/20 >  Antimicrobials: 12/20 zosyn >> 12/20 vanc >>   Devices NA   LINES / TUBES:  12/20  [facility] >>    Continuous Infusions: . sodium chloride 75 mL/hr at 10/03/16 0154     Objective: Vitals:   10/03/16 0600 10/03/16 0700 10/03/16 0800 10/03/16 0819  BP: 106/63 (!) 93/46 (!) 79/40   Pulse: (!) 59 60 (!) 59   Resp: 12 17 10    Temp:    97.5 F (36.4 C)  TempSrc:    Oral  SpO2: 99% 97% 97%   Weight:      Height:        Intake/Output Summary (Last 24 hours) at 10/03/16 0900 Last data filed at 10/03/16 0800  Gross per 24 hour  Intake             1845 ml  Output              390 ml  Net             1455 ml   Filed Weights   10/01/16 1520 10/01/16 2015 10/03/16 0420  Weight: 64.4 kg (142 lb) 65.8 kg (145 lb 1 oz) 70.7 kg (155 lb 13.8 oz)    Examination:  General: A/O 4, NAD, No acute respiratory distress Eyes: negative scleral hemorrhage, negative anisocoria, negative icterus ENT: Negative Runny nose, negative gingival bleeding, Neck:  Negative scars, masses, torticollis, lymphadenopathy, JVD Lungs: Clear to auscultation bilaterally without wheezes  or crackles Cardiovascular: Regular rate and rhythm without murmur gallop or rub normal S1 and S2 Abdomen: negative abdominal pain, nondistended, positive soft, bowel sounds, no rebound, no ascites, no appreciable mass Extremities: No significant cyanosis, clubbing, or edema bilateral lower extremities Skin: Negative rashes, lesions, ulcers Psychiatric:  Negative depression, negative anxiety, negative fatigue, negative mania  Central nervous system:  Cranial nerves II through XII intact, tongue/uvula midline, all extremities muscle strength 5/5, sensation intact throughout, negative dysarthria, negative expressive aphasia, negative receptive aphasia.  .     Data Reviewed: Care  during the described time interval was provided by me .  I have reviewed this patient's available data, including medical history, events of note, physical examination, and all test results as part of my evaluation. I have personally reviewed and interpreted all radiology studies.  CBC:  Recent Labs Lab 10/01/16 1544 10/01/16 1617 10/02/16 0600  WBC 13.8*  --  9.9  NEUTROABS 12.1*  --   --   HGB 11.3* 13.6 9.6*  HCT 39.1 40.0 28.7*  MCV 104.8*  --  91.1  PLT 303  --  076   Basic Metabolic Panel:  Recent Labs Lab 10/02/16 0225 10/02/16 0600 10/02/16 1330 10/02/16 2131 10/03/16 0530  NA 138 136 135 135 135  K 3.9 4.3 4.6 4.0 4.0  CL 114* 113* 111 112* 112*  CO2 16* 14* 11* 14* 14*  GLUCOSE 72 193* 394* 91 119*  BUN 36* 37* 40* 41* 42*  CREATININE 4.09* 4.21* 4.60* 5.01* 5.34*  CALCIUM 8.2* 7.9* 7.8* 7.7* 7.7*  MG  --  1.9  --   --   --   PHOS  --  5.2*  --   --   --    GFR: Estimated Creatinine Clearance: 12.7 mL/min (by C-G formula based on SCr of 5.34 mg/dL (H)). Liver Function Tests:  Recent Labs Lab 10/01/16 1824 10/02/16 0600  AST 101* 70*  ALT 49 37  ALKPHOS 138* 96  BILITOT 0.4 0.5  PROT 6.8 5.0*  ALBUMIN 3.6 2.5*   No results for input(s): LIPASE, AMYLASE in the last 168 hours. No results for input(s): AMMONIA in the last 168 hours. Coagulation Profile: No results for input(s): INR, PROTIME in the last 168 hours. Cardiac Enzymes:  Recent Labs Lab 10/01/16 1544 10/01/16 1824 10/01/16 1921 10/02/16 0225 10/02/16 0721  CKTOTAL  --  1,214*  --   --   --   TROPONINI 0.05*  --  0.05* 0.05* 0.03*   BNP (last 3 results) No results for input(s): PROBNP in the last 8760 hours. HbA1C: No results for input(s): HGBA1C in the last 72 hours. CBG:  Recent Labs Lab 10/02/16 1755 10/02/16 1949 10/02/16 2335 10/03/16 0337 10/03/16 0817  GLUCAP 142* 147* 80 162* 125*   Lipid Profile: No results for input(s): CHOL, HDL, LDLCALC, TRIG, CHOLHDL,  LDLDIRECT in the last 72 hours. Thyroid Function Tests: No results for input(s): TSH, T4TOTAL, FREET4, T3FREE, THYROIDAB in the last 72 hours. Anemia Panel: No results for input(s): VITAMINB12, FOLATE, FERRITIN, TIBC, IRON, RETICCTPCT in the last 72 hours. Urine analysis:    Component Value Date/Time   COLORURINE YELLOW 10/01/2016 2205   APPEARANCEUR HAZY (A) 10/01/2016 2205   LABSPEC 1.009 10/01/2016 2205   PHURINE 5.0 10/01/2016 2205   GLUCOSEU >=500 (A) 10/01/2016 2205   HGBUR LARGE (A) 10/01/2016 2205   BILIRUBINUR NEGATIVE 10/01/2016 2205   KETONESUR 5 (A) 10/01/2016 2205   PROTEINUR 100 (A) 10/01/2016 2205   UROBILINOGEN 0.2  08/21/2015 2154   NITRITE NEGATIVE 10/01/2016 2205   LEUKOCYTESUR NEGATIVE 10/01/2016 2205   Sepsis Labs: @LABRCNTIP (procalcitonin:4,lacticidven:4)  ) Recent Results (from the past 240 hour(s))  Culture, blood (routine x 2)     Status: None (Preliminary result)   Collection Time: 10/01/16  6:28 PM  Result Value Ref Range Status   Specimen Description RIGHT ANTECUBITAL  Final   Special Requests IN PEDIATRIC BOTTLE 3CC  Final   Culture NO GROWTH < 24 HOURS  Final   Report Status PENDING  Incomplete  Culture, blood (routine x 2)     Status: None (Preliminary result)   Collection Time: 10/01/16  6:33 PM  Result Value Ref Range Status   Specimen Description BLOOD LEFT HAND  Final   Special Requests BOTTLES DRAWN AEROBIC ONLY 10CC  Final   Culture NO GROWTH < 24 HOURS  Final   Report Status PENDING  Incomplete  MRSA PCR Screening     Status: None   Collection Time: 10/01/16  8:12 PM  Result Value Ref Range Status   MRSA by PCR NEGATIVE NEGATIVE Final    Comment:        The GeneXpert MRSA Assay (FDA approved for NASAL specimens only), is one component of a comprehensive MRSA colonization surveillance program. It is not intended to diagnose MRSA infection nor to guide or monitor treatment for MRSA infections.          Radiology  Studies: Ct Head Wo Contrast  Result Date: 10/01/2016 CLINICAL DATA:  Fall. Found on floor unresponsive. Initial encounter. EXAM: CT HEAD WITHOUT CONTRAST CT CERVICAL SPINE WITHOUT CONTRAST TECHNIQUE: Multidetector CT imaging of the head and cervical spine was performed following the standard protocol without intravenous contrast. Multiplanar CT image reconstructions of the cervical spine were also generated. COMPARISON:  08/17/2016 FINDINGS: CT HEAD FINDINGS Brain: No evidence of acute infarction, hemorrhage, hydrocephalus, extra-axial collection or mass lesion/mass effect. Vascular: Atherosclerotic calcification. Skull: Negative for fracture Sinuses/Orbits: No acute finding. Mild secretions layering the sinuses. Left maxillary sinusitis is improved from comparison. Nasal septal perforation. CT CERVICAL SPINE FINDINGS Alignment: Reversal of cervical lordosis from degenerative disease. No traumatic malalignment when compared to prior. Skull base and vertebrae: Negative for acute fracture. T2 and T4 superior endplate concavities are chronic. Soft tissues and spinal canal: No gross canal hematoma or prevertebral edema. Disc levels: Advanced disc degeneration with narrowing, spurring, and sclerosis from C3-4 to C6-7. Multilevel facet arthropathy. Upper chest: No acute finding. IMPRESSION: 1. No evidence of acute intracranial or cervical spine injury. 2. Stable exam compared to prior.  Incidental findings noted above. Electronically Signed   By: Monte Fantasia M.D.   On: 10/01/2016 16:50   Ct Cervical Spine Wo Contrast  Result Date: 10/01/2016 CLINICAL DATA:  Fall. Found on floor unresponsive. Initial encounter. EXAM: CT HEAD WITHOUT CONTRAST CT CERVICAL SPINE WITHOUT CONTRAST TECHNIQUE: Multidetector CT imaging of the head and cervical spine was performed following the standard protocol without intravenous contrast. Multiplanar CT image reconstructions of the cervical spine were also generated. COMPARISON:   08/17/2016 FINDINGS: CT HEAD FINDINGS Brain: No evidence of acute infarction, hemorrhage, hydrocephalus, extra-axial collection or mass lesion/mass effect. Vascular: Atherosclerotic calcification. Skull: Negative for fracture Sinuses/Orbits: No acute finding. Mild secretions layering the sinuses. Left maxillary sinusitis is improved from comparison. Nasal septal perforation. CT CERVICAL SPINE FINDINGS Alignment: Reversal of cervical lordosis from degenerative disease. No traumatic malalignment when compared to prior. Skull base and vertebrae: Negative for acute fracture. T2 and T4 superior endplate concavities  are chronic. Soft tissues and spinal canal: No gross canal hematoma or prevertebral edema. Disc levels: Advanced disc degeneration with narrowing, spurring, and sclerosis from C3-4 to C6-7. Multilevel facet arthropathy. Upper chest: No acute finding. IMPRESSION: 1. No evidence of acute intracranial or cervical spine injury. 2. Stable exam compared to prior.  Incidental findings noted above. Electronically Signed   By: Monte Fantasia M.D.   On: 10/01/2016 16:50   Dg Pelvis Portable  Result Date: 10/01/2016 CLINICAL DATA:  Pain after fall. Initial encounter. EXAM: PORTABLE PELVIS 1-2 VIEWS COMPARISON:  06/16/2016 FINDINGS: Status post ORIF of intertrochanteric left femur fracture. Hypertrophic healing has developed along the lower fracture margin when compared to prior. Dynamic hip screw has settled compared to prior, expected. Bipolar right hip hemiarthroplasty appears located and intact in the frontal projection. No visible pelvic ring fracture or diastasis. Lower lumbar disc degeneration. IMPRESSION: 1. No acute finding. 2. Right hip hemiarthroplasty and left femur intertrochanteric fracture ORIF. The left femur fracture shows progressive healing since 06/16/2016 comparison. If hip pain, two-view hip radiographs would increase sensitivity. Electronically Signed   By: Monte Fantasia M.D.   On:  10/01/2016 17:20   Dg Chest Port 1 View  Result Date: 10/02/2016 CLINICAL DATA:  Initial evaluation for central line placement. EXAM: PORTABLE CHEST 1 VIEW COMPARISON:  Prior radiograph from 07/05/2016. FINDINGS: There has been interval placement of a left IJ approach centra venous catheter. Catheter seen coursing across the expected location of the left brachiocephalic vein and in cephalad into what is probably the superior SVC. Repositioning recommended. Cardiac and mediastinal silhouettes are stable, and remain within normal limits. Lungs are normally inflated. No focal infiltrates. No pulmonary edema or pleural effusion. No pneumothorax. No acute osseous abnormality. IMPRESSION: 1. Malpositioned left IJ approach central venous catheter with tip projecting superiorly, likely in the proximal SVC. Repositioning recommended. 2. Otherwise stable appearance of the chest with no active cardiopulmonary disease identified. These results will be called to the ordering clinician or representative by the Radiologist Assistant, and communication documented in the PACS or zVision Dashboard. Electronically Signed   By: Jeannine Boga M.D.   On: 10/02/2016 02:26        Scheduled Meds: . heparin  5,000 Units Subcutaneous Q8H  . insulin aspart  0-15 Units Subcutaneous Q4H  . insulin glargine  10 Units Subcutaneous Daily  . pantoprazole (PROTONIX) IV  40 mg Intravenous Q24H  . piperacillin-tazobactam (ZOSYN)  IV  2.25 g Intravenous Q6H  . sodium chloride flush  10-40 mL Intracatheter Q12H   Continuous Infusions: . sodium chloride 75 mL/hr at 10/03/16 0154     LOS: 2 days    Time spent: 40 minutes    WOODS, Geraldo Docker, MD [facility] Pager [phone]   If 7PM-7AM, please contact night-coverage [email or web address] Password [email or web address] 10/03/2016, 9:00 AM

## 2016-10-04 ENCOUNTER — Inpatient Hospital Stay (HOSPITAL_COMMUNITY): Payer: Medicare HMO

## 2016-10-04 DIAGNOSIS — M6282 Rhabdomyolysis: Secondary | ICD-10-CM

## 2016-10-04 DIAGNOSIS — N183 Chronic kidney disease, stage 3 unspecified: Secondary | ICD-10-CM

## 2016-10-04 DIAGNOSIS — E118 Type 2 diabetes mellitus with unspecified complications: Secondary | ICD-10-CM

## 2016-10-04 DIAGNOSIS — N170 Acute kidney failure with tubular necrosis: Secondary | ICD-10-CM

## 2016-10-04 DIAGNOSIS — I950 Idiopathic hypotension: Secondary | ICD-10-CM

## 2016-10-04 DIAGNOSIS — I82412 Acute embolism and thrombosis of left femoral vein: Secondary | ICD-10-CM

## 2016-10-04 DIAGNOSIS — E1111 Type 2 diabetes mellitus with ketoacidosis with coma: Secondary | ICD-10-CM

## 2016-10-04 DIAGNOSIS — R778 Other specified abnormalities of plasma proteins: Secondary | ICD-10-CM

## 2016-10-04 DIAGNOSIS — N179 Acute kidney failure, unspecified: Secondary | ICD-10-CM

## 2016-10-04 DIAGNOSIS — I82402 Acute embolism and thrombosis of unspecified deep veins of left lower extremity: Secondary | ICD-10-CM

## 2016-10-04 DIAGNOSIS — R7989 Other specified abnormal findings of blood chemistry: Secondary | ICD-10-CM

## 2016-10-04 DIAGNOSIS — IMO0002 Reserved for concepts with insufficient information to code with codable children: Secondary | ICD-10-CM

## 2016-10-04 DIAGNOSIS — N185 Chronic kidney disease, stage 5: Secondary | ICD-10-CM

## 2016-10-04 DIAGNOSIS — E1165 Type 2 diabetes mellitus with hyperglycemia: Secondary | ICD-10-CM

## 2016-10-04 DIAGNOSIS — G9341 Metabolic encephalopathy: Secondary | ICD-10-CM

## 2016-10-04 LAB — BASIC METABOLIC PANEL
Anion gap: 11 (ref 5–15)
Anion gap: 8 (ref 5–15)
BUN: 40 mg/dL — ABNORMAL HIGH (ref 6–20)
BUN: 42 mg/dL — ABNORMAL HIGH (ref 6–20)
CO2: 12 mmol/L — ABNORMAL LOW (ref 22–32)
CO2: 15 mmol/L — ABNORMAL LOW (ref 22–32)
Calcium: 7.6 mg/dL — ABNORMAL LOW (ref 8.9–10.3)
Calcium: 7.6 mg/dL — ABNORMAL LOW (ref 8.9–10.3)
Chloride: 114 mmol/L — ABNORMAL HIGH (ref 101–111)
Chloride: 114 mmol/L — ABNORMAL HIGH (ref 101–111)
Creatinine, Ser: 4.69 mg/dL — ABNORMAL HIGH (ref 0.61–1.24)
Creatinine, Ser: 4.75 mg/dL — ABNORMAL HIGH (ref 0.61–1.24)
GFR calc Af Amer: 13 mL/min — ABNORMAL LOW (ref >60)
GFR calc Af Amer: 13 mL/min — ABNORMAL LOW (ref >60)
GFR calc non Af Amer: 11 mL/min — ABNORMAL LOW (ref >60)
GFR calc non Af Amer: 11 mL/min — ABNORMAL LOW (ref >60)
Glucose, Bld: 167 mg/dL — ABNORMAL HIGH (ref 65–99)
Glucose, Bld: 96 mg/dL (ref 65–99)
Potassium: 4.3 mmol/L (ref 3.5–5.1)
Potassium: 4.8 mmol/L (ref 3.5–5.1)
Sodium: 137 mmol/L (ref 135–145)
Sodium: 137 mmol/L (ref 135–145)

## 2016-10-04 LAB — URINALYSIS, ROUTINE W REFLEX MICROSCOPIC
Bilirubin Urine: NEGATIVE
Glucose, UA: 50 mg/dL — AB
Ketones, ur: NEGATIVE mg/dL
Nitrite: NEGATIVE
Protein, ur: 30 mg/dL — AB
Specific Gravity, Urine: 1.006 (ref 1.005–1.030)
Squamous Epithelial / LPF: NONE SEEN
pH: 5 (ref 5.0–8.0)

## 2016-10-04 LAB — GLUCOSE, CAPILLARY
Glucose-Capillary: 37 mg/dL — CL (ref 65–99)
Glucose-Capillary: 50 mg/dL — ABNORMAL LOW (ref 65–99)
Glucose-Capillary: 152 mg/dL — ABNORMAL HIGH (ref 65–99)
Glucose-Capillary: 100 mg/dL — ABNORMAL HIGH (ref 65–99)
Glucose-Capillary: 104 mg/dL — ABNORMAL HIGH (ref 65–99)

## 2016-10-04 LAB — VANCOMYCIN, RANDOM: Vancomycin Rm: 10

## 2016-10-04 MED ORDER — INSULIN ASPART 100 UNIT/ML ~~LOC~~ SOLN
0.0000 [IU] | SUBCUTANEOUS | Status: DC
  Administered 2016-10-05: 3 [IU] via SUBCUTANEOUS

## 2016-10-04 MED ORDER — STERILE WATER FOR INJECTION IV SOLN
150.0000 meq | INTRAVENOUS | Status: DC
  Administered 2016-10-04: 150 meq via INTRAVENOUS
  Filled 2016-10-04 (×5): qty 850

## 2016-10-04 NOTE — Progress Notes (Addendum)
PROGRESS NOTE    [name] [name]  QIH:[id number] DOB: 1945-11-29 DOA: 10/01/2016 PCP: Mauricio Po, [name]   Brief Narrative:  70 year old BM PMHx CVA Rt posterior cerebral artery (2016), DM type II uncontrolled with complications, Noncompliant with insulin,  - recent admission in July 2017 for DKA, HTN, DVT (06/2016) on Xarelto,Retroperitoneal hematoma, Vitamin B 12 deficiency and CKD III.   Presented to ED 12/20 after being found down by daughter at home. Daughter stated that she gave 20 units of regular insulin about 1 hour before his arrival. Per EMS blood glucose was 500. Upon arrival to ED patient was lethargic but able to respond approprietely to questions. ABG 7.024/19/98. Glucose 1,1134. PCCM called to [place].   Recent hospitalization 11/5-11/7 for hypoglycemia and Rhabdomyolysis after being found down by daughter with a glucose level of 37.     Subjective: 12/22 A/O 4, NAD, admits to not taking his medication as prescribed   Assessment & Plan:   Active Problems:   Diabetic acidosis without coma ([name])   Acute renal failure with acute tubular necrosis superimposed on stage 3 chronic kidney disease ([name])   Type 2 diabetes mellitus with ketoacidotic coma, without long-term current use of insulin   Elevated troponin   Deep vein thrombosis (DVT) of left lower extremity ([place])   Uncontrolled type 2 diabetes mellitus with complication ([name])   Encephalopathy, metabolic   Increased WOB secondary to DKA -resolved -Maintain saturation >92  HTN/Hypotension,  -Hold Home HTN medications  Dc xarelto  Hypotension  -Resolved  Elevated Trop  - (Prob Demand Ischemia)  Rhabdomyolysis -Continue hydration  HLD  DVT of Left Posterial Tibial Vein (on Xarelto has been treated since Sep) -resolved   Acute on CKD III (Base Crt 1.2-2) -Most likely multifactorial to include uncontrolled diabetes, DKA, dehydration, rhabdomyolysis. -Renal ultrasound pending -Nephrology consulted spoke  with Dr. Justin Mend will see on 12/23 Lab Results  Component Value Date   CREATININE 4.69 (H) 10/04/2016   CREATININE 4.75 (H) 10/03/2016   CREATININE 4.99 (H) 10/03/2016  -normal renal ultrasound see results below -Sodium bicarbonate 22ml/hr  DKA/DM type 2 uncontrolled with complications  -slight high Beta-hydroxybutyric acid confirms -Noncompliant DM medication -Nutrition consult pending -Diabetic coordinator consult pending -Lantus 10 units -Decreased to sensitive SSI : Multiple episodes of hypoglycemia  Acute Metabolic Encephalopathy  -resolved  Infection? -Doubt infection if CBC, lactic acid normal on 12/24 DC antibiotics -If antibiotics DC'd DC left IJ. If left IJ still required for antibiotic dosing will need to be changed or wire secondary to being improperly placed   DVT prophylaxis: Subcutaneous heparin Code Status: Full Family Communication: Daughter at bedside for discussion of plan of care Disposition Plan: Resolution renal failure   Consultants:  Dr. Justin Mend Nephrology pending    Procedures/Significant Events:  Echo 06/2016 > EF 60-65%, with normal function  CT Head 12/20 > No acute  CT C Spine 12/20 > No acute  Duplex 12/21 neg 12/23 renal ultrasound: Normal  VENTILATOR SETTINGS: NA   Cultures Blood 12/20 > NGTD Urine 12/20 > negative  Antimicrobials: 12/20 zosyn >> 12/20 vanc >>   Devices NA   LINES / TUBES:  12/20  [facility]>> (improperly placed) see postprocedure PCXR with tip of catheter curving upward in the SVC. Do not use for administration of medication     Continuous Infusions: .  sodium bicarbonate 150 mEq in sterile water 1000 mL infusion       Objective: Vitals:   10/03/16 1647 10/03/16 2041 10/04/16 0300 10/04/16  0600  BP: (!) 122/59 121/61 126/62 136/69  Pulse: (!) 56 (!) 57 67 (!) 59  Resp: 16 18 18 18   Temp: 98.2 F (36.8 C) 97.9 F (36.6 C) 98.1 F (36.7 C) 98 F (36.7 C)  TempSrc: Oral Oral Oral Oral  SpO2: 97% 95%  96% 100%  Weight:      Height:        Intake/Output Summary (Last 24 hours) at 10/04/16 1621 Last data filed at 10/04/16 1503  Gross per 24 hour  Intake              560 ml  Output             1600 ml  Net            -1040 ml   Filed Weights   10/01/16 1520 10/01/16 2015 10/03/16 0420  Weight: 64.4 kg (142 lb) 65.8 kg (145 lb 1 oz) 70.7 kg (155 lb 13.8 oz)    Examination:  General: A/O 4, NAD, No acute respiratory distress Eyes: negative scleral hemorrhage, negative anisocoria, negative icterus ENT: Negative Runny nose, negative gingival bleeding, Neck:  Negative scars, masses, torticollis, lymphadenopathy, JVD Lungs: Clear to auscultation bilaterally without wheezes or crackles Cardiovascular: Regular rate and rhythm without murmur gallop or rub normal S1 and S2 Abdomen: negative abdominal pain, nondistended, positive soft, bowel sounds, no rebound, no ascites, no appreciable mass Extremities: No significant cyanosis, clubbing, or edema bilateral lower extremities Skin: Negative rashes, lesions, ulcers Psychiatric:  Negative depression, negative anxiety, negative fatigue, negative mania  Central nervous system:  Cranial nerves II through XII intact, tongue/uvula midline, all extremities muscle strength 5/5, sensation intact throughout, negative dysarthria, negative expressive aphasia, negative receptive aphasia.  .     Data Reviewed: Care during the described time interval was provided by me .  I have reviewed this patient's available data, including medical history, events of note, physical examination, and all test results as part of my evaluation. I have personally reviewed and interpreted all radiology studies.  CBC:  Recent Labs Lab 10/01/16 1544 10/01/16 1617 10/02/16 0600  WBC 13.8*  --  9.9  NEUTROABS 12.1*  --   --   HGB 11.3* 13.6 9.6*  HCT 39.1 40.0 28.7*  MCV 104.8*  --  91.1  PLT 303  --  237   Basic Metabolic Panel:  Recent Labs Lab 10/02/16 0600   10/02/16 2131 10/03/16 0530 10/03/16 1424 10/03/16 2356 10/04/16 1113  NA 136  < > 135 135 133* 137 137  K 4.3  < > 4.0 4.0 4.6 4.3 4.8  CL 113*  < > 112* 112* 111 114* 114*  CO2 14*  < > 14* 14* 13* 15* 12*  GLUCOSE 193*  < > 91 119* 246* 96 167*  BUN 37*  < > 41* 42* 41* 42* 40*  CREATININE 4.21*  < > 5.01* 5.34* 4.99* 4.75* 4.69*  CALCIUM 7.9*  < > 7.7* 7.7* 7.5* 7.6* 7.6*  MG 1.9  --   --   --   --   --   --   PHOS 5.2*  --   --   --   --   --   --   < > = values in this interval not displayed. GFR: Estimated Creatinine Clearance: 14.4 mL/min (by C-G formula based on SCr of 4.69 mg/dL (H)). Liver Function Tests:  Recent Labs Lab 10/01/16 1824 10/02/16 0600  AST 101* 70*  ALT 49 37  ALKPHOS  138* 96  BILITOT 0.4 0.5  PROT 6.8 5.0*  ALBUMIN 3.6 2.5*   No results for input(s): LIPASE, AMYLASE in the last 168 hours. No results for input(s): AMMONIA in the last 168 hours. Coagulation Profile: No results for input(s): INR, PROTIME in the last 168 hours. Cardiac Enzymes:  Recent Labs Lab 10/01/16 1544 10/01/16 1824 10/01/16 1921 10/02/16 0225 10/02/16 0721 10/03/16 1833  CKTOTAL  --  1,214*  --   --   --  697*  TROPONINI 0.05*  --  0.05* 0.05* 0.03*  --    BNP (last 3 results) No results for input(s): PROBNP in the last 8760 hours. HbA1C: No results for input(s): HGBA1C in the last 72 hours. CBG:  Recent Labs Lab 10/03/16 1556 10/03/16 2055 10/04/16 0750 10/04/16 0810 10/04/16 1136  GLUCAP 235* 133* 37* 50* 152*   Lipid Profile: No results for input(s): CHOL, HDL, LDLCALC, TRIG, CHOLHDL, LDLDIRECT in the last 72 hours. Thyroid Function Tests: No results for input(s): TSH, T4TOTAL, FREET4, T3FREE, THYROIDAB in the last 72 hours. Anemia Panel: No results for input(s): VITAMINB12, FOLATE, FERRITIN, TIBC, IRON, RETICCTPCT in the last 72 hours. Urine analysis:    Component Value Date/Time   COLORURINE STRAW (A) 10/03/2016 1014   APPEARANCEUR CLEAR  10/03/2016 1014   LABSPEC 1.006 10/03/2016 1014   PHURINE 5.0 10/03/2016 1014   GLUCOSEU 50 (A) 10/03/2016 1014   HGBUR LARGE (A) 10/03/2016 1014   BILIRUBINUR NEGATIVE 10/03/2016 1014   KETONESUR NEGATIVE 10/03/2016 1014   PROTEINUR 30 (A) 10/03/2016 1014   UROBILINOGEN 0.2 08/21/2015 2154   NITRITE NEGATIVE 10/03/2016 1014   LEUKOCYTESUR MODERATE (A) 10/03/2016 1014   Sepsis Labs: @LABRCNTIP (procalcitonin:4,lacticidven:4)  ) Recent Results (from the past 240 hour(s))  Culture, blood (routine x 2)     Status: None (Preliminary result)   Collection Time: 10/01/16  6:28 PM  Result Value Ref Range Status   Specimen Description RIGHT ANTECUBITAL  Final   Special Requests IN PEDIATRIC BOTTLE 3CC  Final   Culture NO GROWTH 3 DAYS  Final   Report Status PENDING  Incomplete  Culture, blood (routine x 2)     Status: None (Preliminary result)   Collection Time: 10/01/16  6:33 PM  Result Value Ref Range Status   Specimen Description BLOOD LEFT HAND  Final   Special Requests BOTTLES DRAWN AEROBIC ONLY 10CC  Final   Culture NO GROWTH 3 DAYS  Final   Report Status PENDING  Incomplete  MRSA PCR Screening     Status: None   Collection Time: 10/01/16  8:12 PM  Result Value Ref Range Status   MRSA by PCR NEGATIVE NEGATIVE Final    Comment:        The GeneXpert MRSA Assay (FDA approved for NASAL specimens only), is one component of a comprehensive MRSA colonization surveillance program. It is not intended to diagnose MRSA infection nor to guide or monitor treatment for MRSA infections.   Urine culture     Status: None   Collection Time: 10/01/16 10:05 PM  Result Value Ref Range Status   Specimen Description URINE, CATHETERIZED  Final   Special Requests NONE  Final   Culture NO GROWTH  Final   Report Status 10/03/2016 FINAL  Final         Radiology Studies: US Renal  Result Date: 10/04/2016 CLINICAL DATA:  Acute renal failure. EXAM: RENAL / URINARY TRACT ULTRASOUND  COMPLETE COMPARISON:  Abdominal CT 07/06/2016 FINDINGS: Right Kidney: Length: 11.9 cm. Echogenicity within  normal limits. No mass or hydronephrosis visualized. Left Kidney: Length: 12.3 cm. Echogenicity within normal limits. No mass or hydronephrosis visualized. Bladder: Bladder is decompressed with a catheter. IMPRESSION: Normal renal ultrasound. Electronically Signed   By: Markus Daft M.D.   On: 10/04/2016 13:35        Scheduled Meds: . heparin  5,000 Units Subcutaneous Q8H  . insulin aspart  0-15 Units Subcutaneous TID WC  . insulin glargine  10 Units Subcutaneous Daily  . piperacillin-tazobactam (ZOSYN)  IV  2.25 g Intravenous Q6H  . sodium bicarbonate  650 mg Oral BID  . sodium chloride flush  10-40 mL Intracatheter Q12H   Continuous Infusions: .  sodium bicarbonate 150 mEq in sterile water 1000 mL infusion       LOS: 3 days    Time spent: 40 minutes    WOODS, Geraldo Docker, MD [facility] Pager [phone]   If 7PM-7AM, please contact night-coverage [email or web address] Password [email or web address] 10/04/2016, 4:21 PM

## 2016-10-04 NOTE — Progress Notes (Addendum)
Pharmacy Antibiotic Note  [name] [name] is a 70 y.o. male admitted on 10/01/2016 with sepsis 2/2 unknown source and AKI.  Pharmacy has been consulted for Zosyn dosing. Today is D#3 of ABX. Pt with CrCl ~14, not on HD. SCr with downward trend, today at 4.69. UOP improved to 0.8 cc/kg/hr.  AFeb, WBC and LA trending down.   Plan: Zosyn 2.25g IV Q6H F/u LOT, renal function, tmax, clinical picture.  Height: 5\' 9"  (175.3 cm) Weight: 155 lb 13.8 oz (70.7 kg) IBW/kg (Calculated) : 70.7  Temp (24hrs), Avg:97.9 F (36.6 C), Min:97.4 F (36.3 C), Max:98.2 F (36.8 C)   Recent Labs Lab 10/01/16 1544  10/01/16 1910 10/02/16 0142  10/02/16 0600  10/02/16 2131 10/03/16 0530 10/03/16 1424 10/03/16 2356 10/04/16 1113  WBC 13.8*  --   --   --   --  9.9  --   --   --   --   --   --   CREATININE 5.14*  < >  --   --   < > 4.21*  < > 5.01* 5.34* 4.99* 4.75* 4.69*  LATICACIDVEN  --   --  7.1* 4.2*  --   --   --   --   --   --   --   --   VANCORANDOM  --   --   --   --   --   --   --   --   --   --  10  --   < > = values in this interval not displayed.  Estimated Creatinine Clearance: 14.4 mL/min (by C-G formula based on SCr of 4.69 mg/dL (H)).    No Known Allergies   Antimicrobials this admission:  Vancomycin 12/21 >> 12/22 Zosyn 12/21 >>  Dose adjustments this admission:  none  Microbiology results:  12/20 BCx: ngtd x2days 12/20 UCx: ngtd final 12/20 MRSA PCR: negative   Thank you for allowing pharmacy to be a part of this patient's care.  Carlean Jews, Pharm.D. PGY1 Pharmacy Resident 12/23/201712:35 PM Pager [phone]

## 2016-10-04 NOTE — Progress Notes (Signed)
[name] ROUNDING NOTE   Subjective:   Interval History:  No complaints  Good urine output   Objective:  Vital signs in last 24 hours:  Temp:  [97.4 F (36.3 C)-98.2 F (36.8 C)] 98 F (36.7 C) (12/23 0600) Pulse Rate:  [56-67] 59 (12/23 0600) Resp:  [16-18] 18 (12/23 0600) BP: (121-136)/(59-69) 136/69 (12/23 0600) SpO2:  [95 %-100 %] 100 % (12/23 0600)  Weight change:  Filed Weights   10/01/16 1520 10/01/16 2015 10/03/16 0420  Weight: 64.4 kg (142 lb) 65.8 kg (145 lb 1 oz) 70.7 kg (155 lb 13.8 oz)    Intake/Output: I/O last 3 completed shifts: In: 7322 [I.V.:1500; Other:10; IV Piggyback:200] Out: 0254 [Urine:1565]   Intake/Output this shift:  Total I/O In: 510 [P.O.:460; IV Piggyback:50] Out: 600 [Urine:600]  CVS- RRR bradycardia noted  RS- CTA JVD elevated  ABD- BS present soft non-distended EXT- pitting edema to thighs  Foley Catheter in place    Basic Metabolic Panel:  Recent Labs Lab 10/02/16 0600  10/02/16 2131 10/03/16 0530 10/03/16 1424 10/03/16 2356 10/04/16 1113  NA 136  < > 135 135 133* 137 137  K 4.3  < > 4.0 4.0 4.6 4.3 4.8  CL 113*  < > 112* 112* 111 114* 114*  CO2 14*  < > 14* 14* 13* 15* 12*  GLUCOSE 193*  < > 91 119* 246* 96 167*  BUN 37*  < > 41* 42* 41* 42* 40*  CREATININE 4.21*  < > 5.01* 5.34* 4.99* 4.75* 4.69*  CALCIUM 7.9*  < > 7.7* 7.7* 7.5* 7.6* 7.6*  MG 1.9  --   --   --   --   --   --   PHOS 5.2*  --   --   --   --   --   --   < > = values in this interval not displayed.  Liver Function Tests:  Recent Labs Lab 10/01/16 1824 10/02/16 0600  AST 101* 70*  ALT 49 37  ALKPHOS 138* 96  BILITOT 0.4 0.5  PROT 6.8 5.0*  ALBUMIN 3.6 2.5*   No results for input(s): LIPASE, AMYLASE in the last 168 hours. No results for input(s): AMMONIA in the last 168 hours.  CBC:  Recent Labs Lab 10/01/16 1544 10/01/16 1617 10/02/16 0600  WBC 13.8*  --  9.9  NEUTROABS 12.1*  --   --   HGB 11.3* 13.6 9.6*  HCT  39.1 40.0 28.7*  MCV 104.8*  --  91.1  PLT 303  --  210    Cardiac Enzymes:  Recent Labs Lab 10/01/16 1544 10/01/16 1824 10/01/16 1921 10/02/16 0225 10/02/16 0721 10/03/16 1833  CKTOTAL  --  1,214*  --   --   --  697*  TROPONINI 0.05*  --  0.05* 0.05* 0.03*  --     BNP: Invalid input(s): POCBNP  CBG:  Recent Labs Lab 10/03/16 1556 10/03/16 2055 10/04/16 0750 10/04/16 0810 10/04/16 1136  GLUCAP 235* 133* 37* 28* 152*    Microbiology: Results for orders placed or performed during the hospital encounter of 10/01/16  Culture, blood (routine x 2)     Status: None (Preliminary result)   Collection Time: 10/01/16  6:28 PM  Result Value Ref Range Status   Specimen Description RIGHT ANTECUBITAL  Final   Special Requests IN PEDIATRIC BOTTLE 3CC  Final   Culture NO GROWTH 3 DAYS  Final   Report Status PENDING  Incomplete  Culture, blood (routine  x 2)     Status: None (Preliminary result)   Collection Time: 10/01/16  6:33 PM  Result Value Ref Range Status   Specimen Description BLOOD LEFT HAND  Final   Special Requests BOTTLES DRAWN AEROBIC ONLY 10CC  Final   Culture NO GROWTH 3 DAYS  Final   Report Status PENDING  Incomplete  MRSA PCR Screening     Status: None   Collection Time: 10/01/16  8:12 PM  Result Value Ref Range Status   MRSA by PCR NEGATIVE NEGATIVE Final    Comment:        The GeneXpert MRSA Assay (FDA approved for NASAL specimens only), is one component of a comprehensive MRSA colonization surveillance program. It is not intended to diagnose MRSA infection nor to guide or monitor treatment for MRSA infections.   Urine culture     Status: None   Collection Time: 10/01/16 10:05 PM  Result Value Ref Range Status   Specimen Description URINE, CATHETERIZED  Final   Special Requests NONE  Final   Culture NO GROWTH  Final   Report Status 10/03/2016 FINAL  Final    Coagulation Studies: No results for input(s): LABPROT, INR in the last 72  hours.  Urinalysis:  Recent Labs  10/01/16 2205 10/03/16 1014  COLORURINE YELLOW STRAW*  LABSPEC 1.009 1.006  PHURINE 5.0 5.0  GLUCOSEU >=500* 50*  HGBUR LARGE* LARGE*  BILIRUBINUR NEGATIVE NEGATIVE  KETONESUR 5* NEGATIVE  PROTEINUR 100* 30*  NITRITE NEGATIVE NEGATIVE  LEUKOCYTESUR NEGATIVE MODERATE*      Imaging: US Renal  Result Date: 10/04/2016 CLINICAL DATA:  Acute renal failure. EXAM: RENAL / URINARY TRACT ULTRASOUND COMPLETE COMPARISON:  Abdominal CT 07/06/2016 FINDINGS: Right Kidney: Length: 11.9 cm. Echogenicity within normal limits. No mass or hydronephrosis visualized. Left Kidney: Length: 12.3 cm. Echogenicity within normal limits. No mass or hydronephrosis visualized. Bladder: Bladder is decompressed with a catheter. IMPRESSION: Normal renal ultrasound. Electronically Signed   By: Markus Daft M.D.   On: 10/04/2016 13:35     Medications:    . heparin  5,000 Units Subcutaneous Q8H  . insulin aspart  0-15 Units Subcutaneous TID WC  . insulin glargine  10 Units Subcutaneous Daily  . piperacillin-tazobactam (ZOSYN)  IV  2.25 g Intravenous Q6H  . sodium bicarbonate  650 mg Oral BID  . sodium chloride flush  10-40 mL Intracatheter Q12H   dextrose, sodium chloride flush  Assessment/ Plan:   Acute on chronic renal failure  Creatinine appears to be a little better today  There is no indication for dialysis  CPK  600  Improved   Will continue to follow    Good urine output   Metabolic acidosis  Will replete with IV bicarbonate  Anemia stable    LOS: 3 Tatia Petrucci W @TODAY @3 :17 PM

## 2016-10-04 NOTE — Progress Notes (Signed)
IV team came to see pt, flush and address Left IJ placed on 10/02/16 with DG chest completed with note stating that IJ is malpositioned with central venous catheter projecting superiorly, likely in the proximal SVC. Repositioning is recommended and should be addressed by attending. Will bring to the attention of attending today.

## 2016-10-04 NOTE — Progress Notes (Addendum)
CRITICAL VALUE ALERT  Critical value received:  CBG 37  Date of notification:  10/04/2016  Time of notification:  7:56  Critical value read back:Yes.    Nurse who received alert:  Rosalio Loud RN  MD notified (1st page):  Dr. Sherral Hammers  Time of first page:  8:00  Given OJ by tech. Will recheck in 15 min  Recheck by tech was 50. More OJ given with breakfast to follow within the next 30 min.   Dr. Sherral Hammers called and this was addressed. He will look through chart and see pt shortly.

## 2016-10-05 DIAGNOSIS — I10 Essential (primary) hypertension: Secondary | ICD-10-CM

## 2016-10-05 LAB — BASIC METABOLIC PANEL
Anion gap: 6 (ref 5–15)
BUN: 37 mg/dL — ABNORMAL HIGH (ref 6–20)
CO2: 19 mmol/L — ABNORMAL LOW (ref 22–32)
Calcium: 7.5 mg/dL — ABNORMAL LOW (ref 8.9–10.3)
Chloride: 114 mmol/L — ABNORMAL HIGH (ref 101–111)
Creatinine, Ser: 3.73 mg/dL — ABNORMAL HIGH (ref 0.61–1.24)
GFR calc Af Amer: 17 mL/min — ABNORMAL LOW (ref >60)
GFR calc non Af Amer: 15 mL/min — ABNORMAL LOW (ref >60)
Glucose, Bld: 151 mg/dL — ABNORMAL HIGH (ref 65–99)
Potassium: 4.3 mmol/L (ref 3.5–5.1)
Sodium: 139 mmol/L (ref 135–145)

## 2016-10-05 LAB — TROPONIN I: Troponin I: <0.03 ng/mL (ref <0.03)

## 2016-10-05 LAB — GLUCOSE, CAPILLARY
Glucose-Capillary: 137 mg/dL — ABNORMAL HIGH (ref 65–99)
Glucose-Capillary: 208 mg/dL — ABNORMAL HIGH (ref 65–99)
Glucose-Capillary: 230 mg/dL — ABNORMAL HIGH (ref 65–99)
Glucose-Capillary: 71 mg/dL (ref 65–99)
Glucose-Capillary: 89 mg/dL (ref 65–99)
Glucose-Capillary: 93 mg/dL (ref 65–99)

## 2016-10-05 LAB — CBC WITH DIFFERENTIAL/PLATELET
Basophils Absolute: 0 10*3/uL (ref 0.0–0.1)
Basophils Relative: 0 %
Eosinophils Absolute: 0.3 10*3/uL (ref 0.0–0.7)
Eosinophils Relative: 5 %
HCT: 25.9 % — ABNORMAL LOW (ref 39.0–52.0)
Hemoglobin: 8.7 g/dL — ABNORMAL LOW (ref 13.0–17.0)
Lymphocytes Relative: 25 %
Lymphs Abs: 1.4 10*3/uL (ref 0.7–4.0)
MCH: 30.2 pg (ref 26.0–34.0)
MCHC: 33.6 g/dL (ref 30.0–36.0)
MCV: 89.9 fL (ref 78.0–100.0)
Monocytes Absolute: 0.4 10*3/uL (ref 0.1–1.0)
Monocytes Relative: 7 %
Neutro Abs: 3.5 10*3/uL (ref 1.7–7.7)
Neutrophils Relative %: 63 %
Platelets: 132 10*3/uL — ABNORMAL LOW (ref 150–400)
RBC: 2.88 MIL/uL — ABNORMAL LOW (ref 4.22–5.81)
RDW: 14.8 % (ref 11.5–15.5)
WBC: 5.6 10*3/uL (ref 4.0–10.5)

## 2016-10-05 LAB — LACTIC ACID, PLASMA: Lactic Acid, Venous: 2 mmol/L (ref 0.5–1.9)

## 2016-10-05 LAB — MAGNESIUM: Magnesium: 1.7 mg/dL (ref 1.7–2.4)

## 2016-10-05 MED ORDER — INSULIN ASPART 100 UNIT/ML ~~LOC~~ SOLN
0.0000 [IU] | Freq: Three times a day (TID) | SUBCUTANEOUS | Status: DC
  Administered 2016-10-06: 3 [IU] via SUBCUTANEOUS
  Administered 2016-10-06: 5 [IU] via SUBCUTANEOUS
  Administered 2016-10-06 – 2016-10-07 (×2): 1 [IU] via SUBCUTANEOUS
  Administered 2016-10-07: 7 [IU] via SUBCUTANEOUS

## 2016-10-05 MED ORDER — ACETAMINOPHEN 325 MG PO TABS
650.0000 mg | ORAL_TABLET | Freq: Four times a day (QID) | ORAL | Status: DC | PRN
  Administered 2016-10-05 – 2016-10-07 (×3): 650 mg via ORAL
  Filled 2016-10-05 (×3): qty 2

## 2016-10-05 MED ORDER — MORPHINE SULFATE (PF) 2 MG/ML IV SOLN
1.0000 mg | INTRAVENOUS | Status: DC | PRN
  Administered 2016-10-06 (×3): 1 mg via INTRAVENOUS
  Filled 2016-10-05 (×3): qty 1

## 2016-10-05 MED ORDER — CARVEDILOL 6.25 MG PO TABS
6.2500 mg | ORAL_TABLET | Freq: Two times a day (BID) | ORAL | Status: DC
  Administered 2016-10-06 – 2016-10-07 (×2): 6.25 mg via ORAL
  Filled 2016-10-05 (×2): qty 1

## 2016-10-05 MED ORDER — AMLODIPINE BESYLATE 10 MG PO TABS
10.0000 mg | ORAL_TABLET | Freq: Every day | ORAL | Status: DC
  Administered 2016-10-05 – 2016-10-07 (×3): 10 mg via ORAL
  Filled 2016-10-05 (×3): qty 1

## 2016-10-05 MED ORDER — SODIUM CHLORIDE 0.45 % IV SOLN
INTRAVENOUS | Status: DC
  Administered 2016-10-05: 12:00:00 via INTRAVENOUS
  Administered 2016-10-06: 1000 mL via INTRAVENOUS

## 2016-10-05 NOTE — Progress Notes (Signed)
[facility] ROUNDING NOTE   Subjective:   Interval History:  No complaints  Labs pending this morning   Objective:  Vital signs in last 24 hours:  Temp:  [98.7 F (37.1 C)-99.1 F (37.3 C)] 98.7 F (37.1 C) (12/24 0532) Pulse Rate:  [57-62] 61 (12/24 0532) Resp:  [18] 18 (12/24 0532) BP: (130-167)/(69-75) 167/75 (12/24 0532) SpO2:  [98 %-100 %] 98 % (12/24 0532)  Weight change:  Filed Weights   10/01/16 1520 10/01/16 2015 10/03/16 0420  Weight: 64.4 kg (142 lb) 65.8 kg (145 lb 1 oz) 70.7 kg (155 lb 13.8 oz)    Intake/Output: I/O last 3 completed shifts: In: 667.5 [P.O.:460; I.V.:57.5; IV Piggyback:150] Out: 1600 [Urine:1600]   Intake/Output this shift:  Total I/O In: -  Out: 1000 [Urine:1000]  CVS- RRR RS- CTA ABD- BS present soft non-distended EXT- no edema   Basic Metabolic Panel:  Recent Labs Lab 10/02/16 0600  10/02/16 2131 10/03/16 0530 10/03/16 1424 10/03/16 2356 10/04/16 1113  NA 136  < > 135 135 133* 137 137  K 4.3  < > 4.0 4.0 4.6 4.3 4.8  CL 113*  < > 112* 112* 111 114* 114*  CO2 14*  < > 14* 14* 13* 15* 12*  GLUCOSE 193*  < > 91 119* 246* 96 167*  BUN 37*  < > 41* 42* 41* 42* 40*  CREATININE 4.21*  < > 5.01* 5.34* 4.99* 4.75* 4.69*  CALCIUM 7.9*  < > 7.7* 7.7* 7.5* 7.6* 7.6*  MG 1.9  --   --   --   --   --   --   PHOS 5.2*  --   --   --   --   --   --   < > = values in this interval not displayed.  Liver Function Tests:  Recent Labs Lab 10/01/16 1824 10/02/16 0600  AST 101* 70*  ALT 49 37  ALKPHOS 138* 96  BILITOT 0.4 0.5  PROT 6.8 5.0*  ALBUMIN 3.6 2.5*   No results for input(s): LIPASE, AMYLASE in the last 168 hours. No results for input(s): AMMONIA in the last 168 hours.  CBC:  Recent Labs Lab 10/01/16 1544 10/01/16 1617 10/02/16 0600  WBC 13.8*  --  9.9  NEUTROABS 12.1*  --   --   HGB 11.3* 13.6 9.6*  HCT 39.1 40.0 28.7*  MCV 104.8*  --  91.1  PLT 303  --  210    Cardiac Enzymes:  Recent  Labs Lab 10/01/16 1544 10/01/16 1824 10/01/16 1921 10/02/16 0225 10/02/16 0721 10/03/16 1833 10/05/16 0516  CKTOTAL  --  1,214*  --   --   --  697*  --   TROPONINI 0.05*  --  0.05* 0.05* 0.03*  --  <0.03    BNP: Invalid input(s): POCBNP  CBG:  Recent Labs Lab 10/04/16 1631 10/04/16 2046 10/05/16 0024 10/05/16 0455 10/05/16 0806  GLUCAP 100* 104* 89 5 71    Microbiology: Results for orders placed or performed during the hospital encounter of 10/01/16  Culture, blood (routine x 2)     Status: None (Preliminary result)   Collection Time: 10/01/16  6:28 PM  Result Value Ref Range Status   Specimen Description RIGHT ANTECUBITAL  Final   Special Requests IN PEDIATRIC BOTTLE 3CC  Final   Culture NO GROWTH 4 DAYS  Final   Report Status PENDING  Incomplete  Culture, blood (routine x 2)     Status: None (Preliminary  result)   Collection Time: 10/01/16  6:33 PM  Result Value Ref Range Status   Specimen Description BLOOD LEFT HAND  Final   Special Requests BOTTLES DRAWN AEROBIC ONLY 10CC  Final   Culture NO GROWTH 4 DAYS  Final   Report Status PENDING  Incomplete  MRSA PCR Screening     Status: None   Collection Time: 10/01/16  8:12 PM  Result Value Ref Range Status   MRSA by PCR NEGATIVE NEGATIVE Final    Comment:        The GeneXpert MRSA Assay (FDA approved for NASAL specimens only), is one component of a comprehensive MRSA colonization surveillance program. It is not intended to diagnose MRSA infection nor to guide or monitor treatment for MRSA infections.   Urine culture     Status: None   Collection Time: 10/01/16 10:05 PM  Result Value Ref Range Status   Specimen Description URINE, CATHETERIZED  Final   Special Requests NONE  Final   Culture NO GROWTH  Final   Report Status 10/03/2016 FINAL  Final    Coagulation Studies: No results for input(s): LABPROT, INR in the last 72 hours.  Urinalysis:  Recent Labs  10/03/16 1014  COLORURINE STRAW*   LABSPEC 1.006  PHURINE 5.0  GLUCOSEU 50*  HGBUR LARGE*  BILIRUBINUR NEGATIVE  KETONESUR NEGATIVE  PROTEINUR 30*  NITRITE NEGATIVE  LEUKOCYTESUR MODERATE*      Imaging: US Renal  Result Date: 10/04/2016 CLINICAL DATA:  Acute renal failure. EXAM: RENAL / URINARY TRACT ULTRASOUND COMPLETE COMPARISON:  Abdominal CT 07/06/2016 FINDINGS: Right Kidney: Length: 11.9 cm. Echogenicity within normal limits. No mass or hydronephrosis visualized. Left Kidney: Length: 12.3 cm. Echogenicity within normal limits. No mass or hydronephrosis visualized. Bladder: Bladder is decompressed with a catheter. IMPRESSION: Normal renal ultrasound. Electronically Signed   By: Markus Daft M.D.   On: 10/04/2016 13:35     Medications:   .  sodium bicarbonate 150 mEq in sterile water 1000 mL infusion 150 mEq (10/04/16 1722)   . heparin  5,000 Units Subcutaneous Q8H  . insulin aspart  0-9 Units Subcutaneous Q4H  . insulin glargine  10 Units Subcutaneous Daily  . piperacillin-tazobactam (ZOSYN)  IV  2.25 g Intravenous Q6H  . sodium bicarbonate  650 mg Oral BID  . sodium chloride flush  10-40 mL Intracatheter Q12H   dextrose, sodium chloride flush  Assessment/ Plan:   Acute on chronic renal failure  Creatinine appears to be a little better labs pending today  There is no indication for dialysis  CPK  600  Improved   Will continue to follow    Good urine output   Metabolic acidosis  Will replete   Anemia stable     LOS: 4 Salinda Snedeker W @TODAY @11 :22 AM

## 2016-10-05 NOTE — Progress Notes (Signed)
Patient blood pressure 167/75.  Notified the Dr. Dreama Saa.

## 2016-10-05 NOTE — Progress Notes (Signed)
Inpatient Diabetes Program Recommendations  AACE/ADA: New Consensus Statement on Inpatient Glycemic Control (2015)  Target Ranges:  Prepandial:   less than 140 mg/dL      Peak postprandial:   less than 180 mg/dL (1-2 hours)      Critically ill patients:  140 - 180 mg/dL   Lab Results  Component Value Date   GLUCAP 137 (H) 10/05/2016   HGBA1C 8.9 (H) 08/17/2016    Review of Glycemic Control:  Results for HALE, CHALFIN (MRN [id number]) as of 10/05/2016 16:06  Ref. Range 10/04/2016 20:46 10/05/2016 00:24 10/05/2016 04:55 10/05/2016 05:16 10/05/2016 08:06 10/05/2016 10:52 10/05/2016 11:54  Glucose-Capillary Latest Ref Range: 65 - 99 mg/dL 104 (H) 89 93  71  137 (H)    Outpatient Diabetes medications: Lantus 10 units daily, Novolog 0-12 units tid with meals Current orders for Inpatient glycemic control: Novolog sensitive tid with meals, Lantus 10 units daily  Inpatient Diabetes Program Recommendations:    Please decrease frequency of Novolog correction to tid with meals.  Patient sensitive to insulin doses as had hypoglycemia 12/23.  Spoke with patient in November of 2017 after being admitted for hypoglycemia.  Likely needs family involvement with insulin/diabetes management.  ? Consider home health at d/c.    Will follow.  Thanks, Adah Perl, RN, BC-ADM Inpatient Diabetes Coordinator Pager [phone]

## 2016-10-05 NOTE — Progress Notes (Signed)
PROGRESS NOTE [facility]   [name] [name]   NFA:[id number] DOB: 19-Aug-1946  DOA: 10/01/2016 PCP: Mauricio Po, [name]   Brief Narrative: 70 year old BM PMHx CVA Rt posterior cerebral artery (2016), DM type II uncontrolled with complications, Noncompliant with insulin,  - recent admission in July 2017 for DKA, HTN, DVT (06/2016) on Xarelto,Retroperitoneal hematoma, Vitamin B 12 deficiency and CKD III.   Presented to ED 12/20 after being found down by daughter at home. Daughter stated that she gave 20 units of regular insulin about 1 hour before his arrival. Per EMS blood glucose was 500. Upon arrival to ED patient was lethargic but able to respond approprietely to questions. ABG 7.024/19/98. Glucose 1,1134. PCCM called to [facility].   Recent hospitalization 11/5-11/7 for hypoglycemia and Rhabdomyolysis after being found down by daughter with a glucose level of 37.   Subjective: Seen and examined have no complaints, patient doing well creatinine trending down.   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 Renal ultrasound normal  Nephrology recommendation appreciated  Sodium bicarbonate 59ml/hr  DKA/DM type 2 uncontrolled with complications  slight high Beta-hydroxybutyric acid confirms Noncompliant DM medication Nutrition consult appreciated  Diabetic coordinator appreciated  Lantus 10 units SSI changed to TID with meals given hypoglycemic episodes   Acute Metabolic  Encephalopathy  resolved  Infection? No clear source at this point  On Zosyn - lactic acid 2, WBC normal  If procalcitonin and Lactic acid down will d/c abx in the am   DVT prophylaxis: Subcutaneous heparin Code Status: Full Family Communication: Daughter at bedside for discussion of plan of care Disposition Plan: Resolution renal failure   Consultants:  Nephrology   Procedures:  Echo 06/2016 >EF 60-65%, with normal function  CT Head 12/20 >No acute  CT C Spine 12/20 >No acute  Duplex 12/21 neg 12/23 renal ultrasound: Normal  Antimicrobials:  Zosyn 12/20->  Vanco 12/20-12/23   Objective: Vitals:   10/04/16 2037 10/05/16 0026 10/05/16 0532 10/05/16 1157  BP: 130/69 130/71 (!) 167/75 (!) 161/73  Pulse: (!) 57 62 61 (!) 57  Resp: 18 18 18    Temp: 98.9 F (37.2 C) 99.1 F (37.3 C) 98.7 F (37.1 C) 98.3 F (36.8 C)  TempSrc: Oral Oral Oral Oral  SpO2: 100% 99% 98% 98%  Weight:      Height:        Intake/Output Summary (Last 24 hours) at 10/05/16 1719 Last data filed at 10/05/16 1600  Gross per 24 hour  Intake           563.75 ml  Output             1000 ml  Net          -436.25 ml   Filed Weights   10/01/16 1520 10/01/16 2015 10/03/16 0420  Weight: 64.4 kg (142 lb) 65.8 kg (145 lb 1 oz) 70.7 kg (155 lb 13.8 oz)    Examination:  General exam: Appears calm and comfortable  HEENT: AC/AT, PERRLA, OP moist and clear Respiratory system: Clear  to auscultation. No wheezes,crackle or rhonchi Cardiovascular system: S1 & S2 heard, RRR. No JVD, murmurs, rubs or gallops Gastrointestinal system: Abdomen is nondistended, soft and non tender Central nervous system: Alert and oriented. Extremities: No pedal edema. Skin: No rashes, lesions or ulcers Psychiatry: Judgement and insight appear normal. Mood & affect appropriate.    Data Reviewed: I have personally reviewed following labs and imaging studies  CBC:  Recent Labs Lab 10/01/16 1544 10/01/16 1617  10/02/16 0600 10/05/16 1052  WBC 13.8*  --  9.9 5.6  NEUTROABS 12.1*  --   --  3.5  HGB 11.3* 13.6 9.6* 8.7*  HCT 39.1 40.0 28.7* 25.9*  MCV 104.8*  --  91.1 89.9  PLT 303  --  210 939*   Basic Metabolic Panel:  Recent Labs Lab 10/02/16 0600  10/03/16 0530 10/03/16 1424 10/03/16 2356 10/04/16 1113 10/05/16 1052  NA 136  < > 135 133* 137 137 139  K 4.3  < > 4.0 4.6 4.3 4.8 4.3  CL 113*  < > 112* 111 114* 114* 114*  CO2 14*  < > 14* 13* 15* 12* 19*  GLUCOSE 193*  < > 119* 246* 96 167* 151*  BUN 37*  < > 42* 41* 42* 40* 37*  CREATININE 4.21*  < > 5.34* 4.99* 4.75* 4.69* 3.73*  CALCIUM 7.9*  < > 7.7* 7.5* 7.6* 7.6* 7.5*  MG 1.9  --   --   --   --   --  1.7  PHOS 5.2*  --   --   --   --   --   --   < > = values in this interval not displayed. GFR: Estimated Creatinine Clearance: 18.2 mL/min (by C-G formula based on SCr of 3.73 mg/dL (H)). Liver Function Tests:  Recent Labs Lab 10/01/16 1824 10/02/16 0600  AST 101* 70*  ALT 49 37  ALKPHOS 138* 96  BILITOT 0.4 0.5  PROT 6.8 5.0*  ALBUMIN 3.6 2.5*   No results for input(s): LIPASE, AMYLASE in the last 168 hours. No results for input(s): AMMONIA in the last 168 hours. Coagulation Profile: No results for input(s): INR, PROTIME in the last 168 hours. Cardiac Enzymes:  Recent Labs Lab 10/01/16 1544 10/01/16 1824 10/01/16 1921 10/02/16 0225 10/02/16 0721 10/03/16 1833 10/05/16 0516  CKTOTAL  --  1,214*  --   --   --  697*  --   TROPONINI 0.05*  --  0.05* 0.05* 0.03*  --  <0.03   BNP (last 3 results) No results for input(s): PROBNP in the last 8760 hours. HbA1C: No results for input(s): HGBA1C in the last 72 hours. CBG:  Recent Labs Lab 10/05/16 0024 10/05/16 0455 10/05/16 0806 10/05/16 1154 10/05/16 1607  GLUCAP 89 93 71 137* 208*   Lipid Profile: No results for input(s): CHOL, HDL, LDLCALC, TRIG, CHOLHDL, LDLDIRECT in the last 72 hours. Thyroid Function Tests: No results for input(s): TSH,  T4TOTAL, FREET4, T3FREE, THYROIDAB in the last 72 hours. Anemia Panel: No results for input(s): VITAMINB12, FOLATE, FERRITIN, TIBC, IRON, RETICCTPCT in the last 72 hours. Sepsis Labs:  Recent Labs Lab 10/01/16 1910 10/02/16 0142 10/02/16 0600 10/03/16 0530 10/05/16 0516  PROCALCITON 31.89  --  45.48 55.59  --   LATICACIDVEN 7.1* 4.2*  --   --  2.0*    Recent Results (from the past 240 hour(s))  Culture, blood (routine x 2)     Status: None (Preliminary result)   Collection Time: 10/01/16  6:28  PM  Result Value Ref Range Status   Specimen Description RIGHT ANTECUBITAL  Final   Special Requests IN PEDIATRIC BOTTLE 3CC  Final   Culture NO GROWTH 4 DAYS  Final   Report Status PENDING  Incomplete  Culture, blood (routine x 2)     Status: None (Preliminary result)   Collection Time: 10/01/16  6:33 PM  Result Value Ref Range Status   Specimen Description BLOOD LEFT HAND  Final   Special Requests BOTTLES DRAWN AEROBIC ONLY 10CC  Final   Culture NO GROWTH 4 DAYS  Final   Report Status PENDING  Incomplete  MRSA PCR Screening     Status: None   Collection Time: 10/01/16  8:12 PM  Result Value Ref Range Status   MRSA by PCR NEGATIVE NEGATIVE Final    Comment:        The GeneXpert MRSA Assay (FDA approved for NASAL specimens only), is one component of a comprehensive MRSA colonization surveillance program. It is not intended to diagnose MRSA infection nor to guide or monitor treatment for MRSA infections.   Urine culture     Status: None   Collection Time: 10/01/16 10:05 PM  Result Value Ref Range Status   Specimen Description URINE, CATHETERIZED  Final   Special Requests NONE  Final   Culture NO GROWTH  Final   Report Status 10/03/2016 FINAL  Final      Radiology Studies: US Renal  Result Date: 10/04/2016 CLINICAL DATA:  Acute renal failure. EXAM: RENAL / URINARY TRACT ULTRASOUND COMPLETE COMPARISON:  Abdominal CT 07/06/2016 FINDINGS: Right Kidney: Length: 11.9 cm.  Echogenicity within normal limits. No mass or hydronephrosis visualized. Left Kidney: Length: 12.3 cm. Echogenicity within normal limits. No mass or hydronephrosis visualized. Bladder: Bladder is decompressed with a catheter. IMPRESSION: Normal renal ultrasound. Electronically Signed   By: Markus Daft M.D.   On: 10/04/2016 13:35     Scheduled Meds: . heparin  5,000 Units Subcutaneous Q8H  . insulin aspart  0-9 Units Subcutaneous Q4H  . insulin glargine  10 Units Subcutaneous Daily  . piperacillin-tazobactam (ZOSYN)  IV  2.25 g Intravenous Q6H  . sodium bicarbonate  650 mg Oral BID  . sodium chloride flush  10-40 mL Intracatheter Q12H   Continuous Infusions: . sodium chloride 75 mL/hr at 10/05/16 1155  .  sodium bicarbonate 150 mEq in sterile water 1000 mL infusion 150 mEq (10/04/16 1722)     LOS: 4 days    Chipper Oman, MD [facility] Pager [phone]  If 7PM-7AM, please contact night-coverage [email or web address] Password [facility] 10/05/2016, 5:19 PM

## 2016-10-05 NOTE — Plan of Care (Signed)
Problem: Food- and Nutrition-Related Knowledge Deficit (NB-1.1) Goal: Nutrition education Formal process to instruct or train a patient/client in a skill or to impart knowledge to help patients/clients voluntarily manage or modify food choices and eating behavior to maintain or improve health. Outcome: Adequate for Discharge  RD consulted for nutrition education regarding diabetes.   Lab Results  Component Value Date   HGBA1C 8.9 (H) 08/17/2016   Spoke with pt at bedside. He reports he self-tests 2-3 times daily and usually achieves readings between 240-300. He reports no changes to blood sugar readings over the past several months. Pt shares that he generally consumes 2 meals and one snack daily (Breakfast: grits, sausage, toast, and orange juice, snack: peanut butter and crackers, Dinner: fish, vegetable, and starch). He consumes mainly water other than AM orange juice.   Pt reports his home regimen is 20 units of Novolog BID. He denies dosing insulin at meals. Per PTA medications list, home regimen is 0-12 units Novolog TID and 10 units Lantus daily. Suspect medication noncompliance is a large contributing factor to optimal DM control. Pt may benefit from simplification of home medication regimen to optimize control.   Pt reports that he sometimes has difficulty obtaining food as he relies on the bus system for transportation. He verbalizes that he plans to purchase more groceries when his daughter brings him to the grocery store. He has a PCP appointment next month. Encouraged pt to keep blood sugar log and food diary for next PCP appointment.   RD provided "Carbohydrate Counting for People with Diabetes" handout from the Academy of [employer]etics. Discussed different food groups and their effects on blood sugar, emphasizing carbohydrate-containing foods. Provided list of carbohydrates and recommended serving sizes of common foods.  Discussed importance of controlled and consistent  carbohydrate intake throughout the day. Provided examples of ways to balance meals/snacks and encouraged intake of high-fiber, whole grain complex carbohydrates. Teach back method used.  Expect fair compliance.  Body mass index is 23.02 kg/m. Pt meets criteria for normal weight range based on current BMI.  Current diet order is carb modified, patient is consuming approximately 75-100% of meals at this time. Labs and medications reviewed. No further nutrition interventions warranted at this time. RD contact information provided. If additional nutrition issues arise, please re-consult RD.  [name] [name] A. Jimmye Norman, RD, LDN, CDE Pager: [phone] After hours Pager: [phone]

## 2016-10-05 NOTE — Progress Notes (Addendum)
CRITICAL VALUE ALERT  Critical value received:  Lactic Acid 2.0  Date of notification:  10/05/16  Time of notification:  0605  Critical value read back:Yes.    Nurse who received alert:  Marvia Pickles RN  MD notified (1st page):  Dr. Dreama Saa   Time of first page:  0609  MD notified (2nd page):  Time of second page:  Responding MD:  Dr. Dreama Saa   Time MD responded:  [phone]  -no new orders

## 2016-10-06 DIAGNOSIS — E0810 Diabetes mellitus due to underlying condition with ketoacidosis without coma: Secondary | ICD-10-CM

## 2016-10-06 LAB — BASIC METABOLIC PANEL
Anion gap: 5 (ref 5–15)
BUN: 31 mg/dL — ABNORMAL HIGH (ref 6–20)
CO2: 24 mmol/L (ref 22–32)
Calcium: 7.7 mg/dL — ABNORMAL LOW (ref 8.9–10.3)
Chloride: 109 mmol/L (ref 101–111)
Creatinine, Ser: 3.13 mg/dL — ABNORMAL HIGH (ref 0.61–1.24)
GFR calc Af Amer: 21 mL/min — ABNORMAL LOW (ref >60)
GFR calc non Af Amer: 19 mL/min — ABNORMAL LOW (ref >60)
Glucose, Bld: 112 mg/dL — ABNORMAL HIGH (ref 65–99)
Potassium: 3.7 mmol/L (ref 3.5–5.1)
Sodium: 138 mmol/L (ref 135–145)

## 2016-10-06 LAB — LACTIC ACID, PLASMA: Lactic Acid, Venous: 1.5 mmol/L (ref 0.5–1.9)

## 2016-10-06 LAB — CBC
HCT: 25.8 % — ABNORMAL LOW (ref 39.0–52.0)
Hemoglobin: 8.7 g/dL — ABNORMAL LOW (ref 13.0–17.0)
MCH: 29.7 pg (ref 26.0–34.0)
MCHC: 33.7 g/dL (ref 30.0–36.0)
MCV: 88.1 fL (ref 78.0–100.0)
Platelets: 133 10*3/uL — ABNORMAL LOW (ref 150–400)
RBC: 2.93 MIL/uL — ABNORMAL LOW (ref 4.22–5.81)
RDW: 14.2 % (ref 11.5–15.5)
WBC: 6.6 10*3/uL (ref 4.0–10.5)

## 2016-10-06 LAB — CULTURE, BLOOD (ROUTINE X 2)
Culture: NO GROWTH
Culture: NO GROWTH

## 2016-10-06 LAB — GLUCOSE, CAPILLARY
Glucose-Capillary: 113 mg/dL — ABNORMAL HIGH (ref 65–99)
Glucose-Capillary: 138 mg/dL — ABNORMAL HIGH (ref 65–99)
Glucose-Capillary: 238 mg/dL — ABNORMAL HIGH (ref 65–99)
Glucose-Capillary: 283 mg/dL — ABNORMAL HIGH (ref 65–99)
Glucose-Capillary: 154 mg/dL — ABNORMAL HIGH (ref 65–99)

## 2016-10-06 LAB — PROCALCITONIN: Procalcitonin: 12.83 ng/mL

## 2016-10-06 MED ORDER — INSULIN GLARGINE 100 UNIT/ML ~~LOC~~ SOLN
12.0000 [IU] | Freq: Every day | SUBCUTANEOUS | Status: DC
Start: 2016-10-07 — End: 2016-10-07
  Administered 2016-10-07: 12 [IU] via SUBCUTANEOUS
  Filled 2016-10-06: qty 0.12

## 2016-10-06 NOTE — Progress Notes (Signed)
[facility] ROUNDING NOTE   Subjective:   Interval History:  No complaints this morning appears to be doing well  Objective:  Vital signs in last 24 hours:  Temp:  [98.2 F (36.8 C)-99 F (37.2 C)] 98.2 F (36.8 C) (12/25 0525) Pulse Rate:  [45-53] 45 (12/25 0900) Resp:  [17] 17 (12/25 0525) BP: (142-178)/(66-70) 142/66 (12/25 0525) SpO2:  [97 %-100 %] 97 % (12/25 0532)  Weight change:  Filed Weights   10/01/16 1520 10/01/16 2015 10/03/16 0420  Weight: 64.4 kg (142 lb) 65.8 kg (145 lb 1 oz) 70.7 kg (155 lb 13.8 oz)    Intake/Output: I/O last 3 completed shifts: In: 746.3 [P.O.:240; I.V.:306.3; IV Piggyback:200] Out: 3050 [Urine:3050]   Intake/Output this shift:  No intake/output data recorded.  CVS- RRR RS- CTA ABD- BS present soft non-distended EXT- no edema   Basic Metabolic Panel:  Recent Labs Lab 10/02/16 0600  10/03/16 1424 10/03/16 2356 10/04/16 1113 10/05/16 1052 10/06/16 0435  NA 136  < > 133* 137 137 139 138  K 4.3  < > 4.6 4.3 4.8 4.3 3.7  CL 113*  < > 111 114* 114* 114* 109  CO2 14*  < > 13* 15* 12* 19* 24  GLUCOSE 193*  < > 246* 96 167* 151* 112*  BUN 37*  < > 41* 42* 40* 37* 31*  CREATININE 4.21*  < > 4.99* 4.75* 4.69* 3.73* 3.13*  CALCIUM 7.9*  < > 7.5* 7.6* 7.6* 7.5* 7.7*  MG 1.9  --   --   --   --  1.7  --   PHOS 5.2*  --   --   --   --   --   --   < > = values in this interval not displayed.  Liver Function Tests:  Recent Labs Lab 10/01/16 1824 10/02/16 0600  AST 101* 70*  ALT 49 37  ALKPHOS 138* 96  BILITOT 0.4 0.5  PROT 6.8 5.0*  ALBUMIN 3.6 2.5*   No results for input(s): LIPASE, AMYLASE in the last 168 hours. No results for input(s): AMMONIA in the last 168 hours.  CBC:  Recent Labs Lab 10/01/16 1544 10/01/16 1617 10/02/16 0600 10/05/16 1052 10/06/16 0435  WBC 13.8*  --  9.9 5.6 6.6  NEUTROABS 12.1*  --   --  3.5  --   HGB 11.3* 13.6 9.6* 8.7* 8.7*  HCT 39.1 40.0 28.7* 25.9* 25.8*  MCV 104.8*   --  91.1 89.9 88.1  PLT 303  --  210 132* 133*    Cardiac Enzymes:  Recent Labs Lab 10/01/16 1544 10/01/16 1824 10/01/16 1921 10/02/16 0225 10/02/16 0721 10/03/16 1833 10/05/16 0516  CKTOTAL  --  1,214*  --   --   --  697*  --   TROPONINI 0.05*  --  0.05* 0.05* 0.03*  --  <0.03    BNP: Invalid input(s): POCBNP  CBG:  Recent Labs Lab 10/05/16 1607 10/05/16 2030 10/06/16 0523 10/06/16 0858 10/06/16 1254  GLUCAP 208* 230* 113* 138* 64*    Microbiology: Results for orders placed or performed during the hospital encounter of 10/01/16  Culture, blood (routine x 2)     Status: None (Preliminary result)   Collection Time: 10/01/16  6:28 PM  Result Value Ref Range Status   Specimen Description RIGHT ANTECUBITAL  Final   Special Requests IN PEDIATRIC BOTTLE 3CC  Final   Culture NO GROWTH 4 DAYS  Final   Report Status PENDING  Incomplete  Culture, blood (routine x 2)     Status: None (Preliminary result)   Collection Time: 10/01/16  6:33 PM  Result Value Ref Range Status   Specimen Description BLOOD LEFT HAND  Final   Special Requests BOTTLES DRAWN AEROBIC ONLY 10CC  Final   Culture NO GROWTH 4 DAYS  Final   Report Status PENDING  Incomplete  MRSA PCR Screening     Status: None   Collection Time: 10/01/16  8:12 PM  Result Value Ref Range Status   MRSA by PCR NEGATIVE NEGATIVE Final    Comment:        The GeneXpert MRSA Assay (FDA approved for NASAL specimens only), is one component of a comprehensive MRSA colonization surveillance program. It is not intended to diagnose MRSA infection nor to guide or monitor treatment for MRSA infections.   Urine culture     Status: None   Collection Time: 10/01/16 10:05 PM  Result Value Ref Range Status   Specimen Description URINE, CATHETERIZED  Final   Special Requests NONE  Final   Culture NO GROWTH  Final   Report Status 10/03/2016 FINAL  Final    Coagulation Studies: No results for input(s): LABPROT, INR in the  last 72 hours.  Urinalysis: No results for input(s): COLORURINE, LABSPEC, PHURINE, GLUCOSEU, HGBUR, BILIRUBINUR, KETONESUR, PROTEINUR, UROBILINOGEN, NITRITE, LEUKOCYTESUR in the last 72 hours.  Invalid input(s): APPERANCEUR    Imaging: No results found.   Medications:   . sodium chloride 1,000 mL (10/06/16 0140)  .  sodium bicarbonate 150 mEq in sterile water 1000 mL infusion 150 mEq (10/04/16 1722)   . amLODipine  10 mg Oral Daily  . carvedilol  6.25 mg Oral BID WC  . heparin  5,000 Units Subcutaneous Q8H  . insulin aspart  0-9 Units Subcutaneous TID WC  . insulin glargine  10 Units Subcutaneous Daily  . piperacillin-tazobactam (ZOSYN)  IV  2.25 g Intravenous Q6H  . sodium bicarbonate  650 mg Oral BID  . sodium chloride flush  10-40 mL Intracatheter Q12H   acetaminophen, morphine injection, sodium chloride flush  Assessment/ Plan:   Acute on chronic renal failure Creatinine appears to be a little better labs pending today There is no indication for dialysis CPK 600 Improved  Baseline creatinine normal     Good urine output   Metabolic acidosis  D/C bicarbonate  Anemia  Stable    Will sigh off and will be happy to follow up in clinic    LOS: 5 [name] [name] W @TODAY @1 :08 PM

## 2016-10-06 NOTE — Progress Notes (Signed)
PROGRESS NOTE [facility]   [name] [name]   IRC:[id number] DOB: 1946-03-31  DOA: 10/01/2016 PCP: Mauricio Po, [name]   Brief Narrative: 70 year old BM PMHx CVA Rt posterior cerebral artery (2016), DM type II uncontrolled with complications, Noncompliant with insulin,  - recent admission in July 2017 for DKA, HTN, DVT (06/2016) on Xarelto,Retroperitoneal hematoma, Vitamin B 12 deficiency and CKD III.   Presented to ED 12/20 after being found down by daughter at home. Daughter stated that she gave 20 units of regular insulin about 1 hour before his arrival. Per EMS blood glucose was 500. Upon arrival to ED patient was lethargic but able to respond approprietely to questions. ABG 7.024/19/98. Glucose 1,1134. PCCM called to [place].   Recent hospitalization 11/5-11/7 for hypoglycemia and Rhabdomyolysis after being found down by daughter with a glucose level of 37.   Subjective: Have no complaints Cr trending continues to improve. Good UOP   Assessment & Plan: Acute on CKD III 2/2 Rhabdo (Base Crt 1.2-2) - Creatinine continues to improve  Most likely multifactorial to include uncontrolled diabetes, DKA, dehydration, rhabdomyolysis. 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 No source of infection was identified  Was on Zosyn empirically - lactic acid 2, WBC normal, afebrile  Procalcitonin not a good indicator for infection other that PNA  Will d/c zosyn.  Monitor  Anemia of chronic disease Hgb stable  Anemia panel   DVT prophylaxis: Subcutaneous heparin Code Status: Full Family Communication: Daughter at bedside for discussion of plan of care Disposition Plan: Cr close to baseline, PT recommended SNF, hopefully can be discharge tomorrow.   Consultants:  Nephrology   Procedures:  Echo 06/2016 >EF 60-65%, with normal function  CT Head 12/20 >No acute  CT C Spine 12/20 >No acute  Duplex 12/21 neg 12/23 renal ultrasound: Normal  Antimicrobials:  Zosyn 12/20->  Vanco 12/20-12/23   Objective: Vitals:   10/05/16 2034 10/06/16 0525 10/06/16 0532 10/06/16 0900  BP: (!) 178/70 (!) 142/66    Pulse: (!) 53 (!) 49 (!) 50 (!) 45  Resp: 17 17    Temp: 99 F (37.2 C) 98.2 F (36.8 C)    TempSrc: Oral Oral    SpO2: 100%  97%   Weight:      Height:        Intake/Output Summary (Last 24 hours) at 10/06/16 1428 Last data filed at 10/06/16 1315  Gross per 24 hour  Intake           746.25 ml  Output             3050 ml  Net         -2303.75 ml  Filed Weights   10/01/16 1520 10/01/16 2015 10/03/16 0420  Weight: 64.4 kg (142 lb) 65.8 kg (145 lb 1 oz) 70.7 kg (155 lb 13.8 oz)    Examination:  General exam: NAD Respiratory system: Clear to auscultation. No wheezes,crackle or rhonchi Cardiovascular system: S1 & S2 heard, RRR.  Gastrointestinal system: Abdomen is nondistended, soft and non tender Central nervous system: Alert and oriented. Extremities: No pedal edema.   Data Reviewed: I have personally reviewed following labs and imaging  studies  CBC:  Recent Labs Lab 10/01/16 1544 10/01/16 1617 10/02/16 0600 10/05/16 1052 10/06/16 0435  WBC 13.8*  --  9.9 5.6 6.6  NEUTROABS 12.1*  --   --  3.5  --   HGB 11.3* 13.6 9.6* 8.7* 8.7*  HCT 39.1 40.0 28.7* 25.9* 25.8*  MCV 104.8*  --  91.1 89.9 88.1  PLT 303  --  210 132* 338*   Basic Metabolic Panel:  Recent Labs Lab 10/02/16 0600  10/03/16 1424 10/03/16 2356 10/04/16 1113 10/05/16 1052 10/06/16 0435  NA 136  < > 133* 137 137 139 138  K 4.3  < > 4.6 4.3 4.8 4.3 3.7  CL 113*  < > 111 114* 114* 114* 109  CO2 14*  < > 13* 15* 12* 19* 24  GLUCOSE 193*  < > 246* 96 167* 151* 112*  BUN 37*  < > 41* 42* 40* 37* 31*  CREATININE 4.21*  < > 4.99* 4.75* 4.69* 3.73* 3.13*  CALCIUM 7.9*  < > 7.5* 7.6* 7.6* 7.5* 7.7*  MG 1.9  --   --   --   --  1.7  --   PHOS 5.2*  --   --   --   --   --   --   < > = values in this interval not displayed. GFR: Estimated Creatinine Clearance: 21.6 mL/min (by C-G formula based on SCr of 3.13 mg/dL (H)). Liver Function Tests:  Recent Labs Lab 10/01/16 1824 10/02/16 0600  AST 101* 70*  ALT 49 37  ALKPHOS 138* 96  BILITOT 0.4 0.5  PROT 6.8 5.0*  ALBUMIN 3.6 2.5*   No results for input(s): LIPASE, AMYLASE in the last 168 hours. No results for input(s): AMMONIA in the last 168 hours. Coagulation Profile: No results for input(s): INR, PROTIME in the last 168 hours. Cardiac Enzymes:  Recent Labs Lab 10/01/16 1544 10/01/16 1824 10/01/16 1921 10/02/16 0225 10/02/16 0721 10/03/16 1833 10/05/16 0516  CKTOTAL  --  1,214*  --   --   --  697*  --   TROPONINI 0.05*  --  0.05* 0.05* 0.03*  --  <0.03   BNP (last 3 results) No results for input(s): PROBNP in the last 8760 hours. HbA1C: No results for input(s): HGBA1C in the last 72 hours. CBG:  Recent Labs Lab 10/05/16 1607 10/05/16 2030 10/06/16 0523 10/06/16 0858 10/06/16 1254  GLUCAP 208* 230* 113* 138* 238*   Lipid Profile: No results for input(s): CHOL, HDL,  LDLCALC, TRIG, CHOLHDL, LDLDIRECT in the last 72 hours. Thyroid Function Tests: No results for input(s): TSH, T4TOTAL, FREET4, T3FREE, THYROIDAB in the last 72 hours. Anemia Panel: No results for input(s): VITAMINB12, FOLATE, FERRITIN, TIBC, IRON, RETICCTPCT in the last 72 hours. Sepsis Labs:  Recent Labs Lab 10/01/16 1910 10/02/16 0142 10/02/16 0600 10/03/16 0530 10/05/16 0516 10/06/16 0435  PROCALCITON 31.89  --  45.48 55.59  --  12.83  LATICACIDVEN 7.1* 4.2*  --   --  2.0* 1.5  Recent Results (from the past 240 hour(s))  Culture, blood (routine x 2)     Status: None (Preliminary result)   Collection Time: 10/01/16  6:28 PM  Result Value Ref Range Status   Specimen Description RIGHT ANTECUBITAL  Final   Special Requests IN PEDIATRIC BOTTLE 3CC  Final   Culture NO GROWTH 4 DAYS  Final   Report Status PENDING  Incomplete  Culture, blood (routine x 2)     Status: None (Preliminary result)   Collection Time: 10/01/16  6:33 PM  Result Value Ref Range Status   Specimen Description BLOOD LEFT HAND  Final   Special Requests BOTTLES DRAWN AEROBIC ONLY 10CC  Final   Culture NO GROWTH 4 DAYS  Final   Report Status PENDING  Incomplete  MRSA PCR Screening     Status: None   Collection Time: 10/01/16  8:12 PM  Result Value Ref Range Status   MRSA by PCR NEGATIVE NEGATIVE Final    Comment:        The GeneXpert MRSA Assay (FDA approved for NASAL specimens only), is one component of a comprehensive MRSA colonization surveillance program. It is not intended to diagnose MRSA infection nor to guide or monitor treatment for MRSA infections.   Urine culture     Status: None   Collection Time: 10/01/16 10:05 PM  Result Value Ref Range Status   Specimen Description URINE, CATHETERIZED  Final   Special Requests NONE  Final   Culture NO GROWTH  Final   Report Status 10/03/2016 FINAL  Final      Radiology Studies: No results found.   Scheduled Meds: . amLODipine  10 mg Oral  Daily  . carvedilol  6.25 mg Oral BID WC  . heparin  5,000 Units Subcutaneous Q8H  . insulin aspart  0-9 Units Subcutaneous TID WC  . insulin glargine  10 Units Subcutaneous Daily  . sodium chloride flush  10-40 mL Intracatheter Q12H   Continuous Infusions:    LOS: 5 days    Chipper Oman, MD [facility] Pager [phone]  If 7PM-7AM, please contact night-coverage [email or web address] Password [email or web address] 10/06/2016, 2:28 PM

## 2016-10-07 LAB — BASIC METABOLIC PANEL
Anion gap: 7 (ref 5–15)
BUN: 22 mg/dL — ABNORMAL HIGH (ref 6–20)
CO2: 23 mmol/L (ref 22–32)
Calcium: 8 mg/dL — ABNORMAL LOW (ref 8.9–10.3)
Chloride: 109 mmol/L (ref 101–111)
Creatinine, Ser: 2.06 mg/dL — ABNORMAL HIGH (ref 0.61–1.24)
GFR calc Af Amer: 36 mL/min — ABNORMAL LOW (ref >60)
GFR calc non Af Amer: 31 mL/min — ABNORMAL LOW (ref >60)
Glucose, Bld: 49 mg/dL — ABNORMAL LOW (ref 65–99)
Potassium: 3.2 mmol/L — ABNORMAL LOW (ref 3.5–5.1)
Sodium: 139 mmol/L (ref 135–145)

## 2016-10-07 LAB — VITAMIN B12: Vitamin B-12: 436 pg/mL (ref 180–914)

## 2016-10-07 LAB — PROCALCITONIN: Procalcitonin: 5.69 ng/mL

## 2016-10-07 LAB — FOLATE: Folate: 14.8 ng/mL (ref >5.9)

## 2016-10-07 LAB — GLUCOSE, CAPILLARY
Glucose-Capillary: 97 mg/dL (ref 65–99)
Glucose-Capillary: 39 mg/dL — CL (ref 65–99)
Glucose-Capillary: 120 mg/dL — ABNORMAL HIGH (ref 65–99)
Glucose-Capillary: 125 mg/dL — ABNORMAL HIGH (ref 65–99)
Glucose-Capillary: 309 mg/dL — ABNORMAL HIGH (ref 65–99)

## 2016-10-07 LAB — IRON AND TIBC
Iron: 49 ug/dL (ref 45–182)
Saturation Ratios: 24 % (ref 17.9–39.5)
TIBC: 200 ug/dL — ABNORMAL LOW (ref 250–450)
UIBC: 151 ug/dL

## 2016-10-07 LAB — CK: Total CK: 223 U/L (ref 49–397)

## 2016-10-07 LAB — FERRITIN: Ferritin: 383 ng/mL — ABNORMAL HIGH (ref 24–336)

## 2016-10-07 LAB — RETICULOCYTES
RBC.: 2.98 MIL/uL — ABNORMAL LOW (ref 4.22–5.81)
Retic Count, Absolute: 26.8 10*3/uL (ref 19.0–186.0)
Retic Ct Pct: 0.9 % (ref 0.4–3.1)

## 2016-10-07 MED ORDER — POTASSIUM CHLORIDE CRYS ER 20 MEQ PO TBCR
60.0000 meq | EXTENDED_RELEASE_TABLET | Freq: Once | ORAL | Status: AC
  Administered 2016-10-07: 60 meq via ORAL
  Filled 2016-10-07: qty 3

## 2016-10-07 NOTE — Care Management Important Message (Signed)
Important Message  Patient Details  Name: [name] [name] MRN: [id number] Date of Birth: 04-Jan-1946   Medicare Important Message Given:  Yes    Orbie Pyo 10/07/2016, 2:14 PM

## 2016-10-07 NOTE — Progress Notes (Signed)
Inpatient Diabetes Program Recommendations  AACE/ADA: New Consensus Statement on Inpatient Glycemic Control (2015)  Target Ranges:  Prepandial:   less than 140 mg/dL      Peak postprandial:   less than 180 mg/dL (1-2 hours)      Critically ill patients:  140 - 180 mg/dL   Results for KINGSTEN, ENFIELD (MRN [id number]) as of 10/07/2016 07:35  Ref. Range 10/06/2016 05:23 10/06/2016 08:58 10/06/2016 12:54 10/06/2016 16:58 10/06/2016 21:55 10/07/2016 01:04 10/07/2016 05:16 10/07/2016 06:19  Glucose-Capillary Latest Ref Range: 65 - 99 mg/dL 113 (H) 138 (H) 238 (H) 283 (H) 154 (H) 97 39 (LL) 120 (H)   Review of Glycemic Control  Diabetes history: DM2 Outpatient Diabetes medications: Lantus 10 units daily, Novolog 0-12 units TID with meals Current orders for Inpatient glycemic control: Lantus 10 units daily, Novolog 0-9 units TID with meals   Inpatient Diabetes Program Recommendations:  Insulin - Basal: Fasting glucose 39 mg/dl this morning. Please consider decreasing Lantus to 9 units daily. Insulin - Meal Coverage: If patient is eating at least 50% of meals, please consider ordering Novolog 3 units TID with meals for meal coverage.  Thanks, Barnie Alderman, RN, MSN, CDE Diabetes Coordinator Inpatient Diabetes Program [phone] (Team Pager from 8am to 5pm)

## 2016-10-07 NOTE — Progress Notes (Signed)
Hypoglycemic Event  CBG: 39  Treatment: 15 GM carbohydrate snack  Symptoms: None  Follow-up CBG: [id number] CBG [id number]  Possible Reasons for Event: Unknown  Comments/MD notified:[facility].    Danielle Dess

## 2016-10-07 NOTE — Discharge Summary (Signed)
Physician Discharge Summary  [name] [name]  OIZ:[id number]  DOB: 1945-11-18  DOA: 10/01/2016 PCP: Mauricio Po, [name]  Admit date: 10/01/2016 Discharge date: 10/07/2016  Admitted From: Home  Disposition:  Home  Recommendations for Outpatient Follow-up:  1. Follow up with PCP in 1-2 weeks 2. 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   Presented to ED 12/20 after being found down by daughter at home. Daughter stated that she gave 20 units of regular insulin about 1 hour before his arrival. Per EMS blood glucose was 500. Upon arrival to ED patient was lethargic but able to respond approprietely to questions. ABG 7.024/19/98. Glucose 1,1134. PCCM called to [place].   Recent hospitalization 11/5-11/7 for hypoglycemia and Rhabdomyolysis after being found down by daughter with a glucose level of 37.  Subjective: Patient seen and examined. Have no new complaints.    Discharge Diagnoses/Hospital Course:  Acute on CKD III 2/2 Rhabdo (Base Crt 1.2-2) - Creatinine back to baseline, treated with IVF and bicarb  Most likely multifactorial to include uncontrolled diabetes, DKA, dehydration, rhabdomyolysis. Renal ultrasound normal Cr expected to go back to baseline  Encourage oral hydration  Follow up with Dr. 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  Diet modification discussed  Check BS goal < 140 in AM   Acute Metabolic Encephalopathy resolved  Infection? No source of infection was identified  Was on Zosyn empirically - lactic acid 2, WBC normal, afebrile  Procalcitonin not a good indicator for infection other that PNA  d/ced zosyn.  Blood cultures negative, no UTI No antibiotics indicated   Anemia of chronic disease - 2/2 to CKD  Hgb stable  Anemia panel no iron def, B12 and folate levels normal  Monitor cbc in 1 week   Physical deconditioning due to acute illness  PT recommended SNF for rehab - patient refused  Patient set up with [facility] services   Discharge Instructions  Discharge Instructions    Call MD for:  difficulty breathing, headache or visual disturbances    Complete by:  As directed    Call MD for:  extreme fatigue    Complete by:  As directed    Call MD for:  hives  Complete by:  As directed    Call MD for:  persistant dizziness or light-headedness    Complete by:  As directed    Call MD for:  persistant nausea and vomiting    Complete by:  As directed    Call MD for:  redness, tenderness, or signs of infection (pain, swelling, redness, odor or green/yellow discharge around incision site)    Complete by:  As directed    Call  MD for:  severe uncontrolled pain    Complete by:  As directed    Call MD for:  temperature >100.4    Complete by:  As directed    Diet - low sodium heart healthy    Complete by:  As directed    Discharge instructions    Complete by:  As directed    Increase activity slowly    Complete by:  As directed      Allergies as of 10/07/2016   No Known Allergies     Medication List    TAKE these medications   acetaminophen 325 MG tablet Commonly known as:  TYLENOL Take 2 tablets (650 mg total) by mouth every 6 (six) hours as needed for mild pain (or Fever >/= 101).   amLODipine 10 MG tablet Commonly known as:  NORVASC Take 1 tablet (10 mg total) by mouth daily.   atorvastatin 10 MG tablet Commonly known as:  LIPITOR Take 1 tablet (10 mg total) by mouth daily at 6 PM.   carvedilol 6.25 MG tablet Commonly known as:  COREG Take 1 tablet (6.25 mg total) by mouth 2 (two) times daily with a meal.   cyanocobalamin 1000 MCG/ML injection Commonly known as:  (VITAMIN B-12) Inject 1 mL (1,000 mcg total) into the muscle every 30 (thirty) days.   HYDROcodone-acetaminophen 5-325 MG tablet Commonly known as:  NORCO/VICODIN Take 1 tablet by mouth every 4 (four) hours as needed for severe pain.   insulin aspart 100 UNIT/ML FlexPen Commonly known as:  NOVOLOG FLEXPEN Inject 0-12 Units into the skin 3 (three) times daily with meals. Per sliding scale, 0-150 = 0 units, 151-200 = 2 units, 201-250 = 4 units, 251-300 = 6 units, 301-350 = 8 units, 351-400 = 10 units, 401-500 = 12 units (call MD)   insulin glargine 100 UNIT/ML injection Commonly known as:  LANTUS Inject 0.1 mLs (10 Units total) into the skin daily.   polyethylene glycol powder powder Commonly known as:  GLYCOLAX/MIRALAX Take 17 g by mouth every morning.   rivaroxaban 20 MG Tabs tablet Commonly known as:  XARELTO Take 1 tablet (20 mg total) by mouth daily with supper. STARTING October 16.   senna 8.6 MG Tabs tablet Commonly  known as:  SENOKOT Take 1 tablet (8.6 mg total) by mouth 2 (two) times daily.   thiamine 100 MG tablet Take 1 tablet (100 mg total) by mouth daily.       No Known Allergies  Consultations:  Nephrology Dr Justin Mend   Procedures/Studies: Ct Head Wo Contrast  Result Date: 10/01/2016 CLINICAL DATA:  Fall. Found on floor unresponsive. Initial encounter. EXAM: CT HEAD WITHOUT CONTRAST CT CERVICAL SPINE WITHOUT CONTRAST TECHNIQUE: Multidetector CT imaging of the head and cervical spine was performed following the standard protocol without intravenous contrast. Multiplanar CT image reconstructions of the cervical spine were also generated. COMPARISON:  08/17/2016 FINDINGS: CT HEAD FINDINGS Brain: No evidence of acute infarction, hemorrhage, hydrocephalus, extra-axial collection or mass lesion/mass effect. Vascular: Atherosclerotic calcification. Skull: Negative for fracture Sinuses/Orbits:  No acute finding. Mild secretions layering the sinuses. Left maxillary sinusitis is improved from comparison. Nasal septal perforation. CT CERVICAL SPINE FINDINGS Alignment: Reversal of cervical lordosis from degenerative disease. No traumatic malalignment when compared to prior. Skull base and vertebrae: Negative for acute fracture. T2 and T4 superior endplate concavities are chronic. Soft tissues and spinal canal: No gross canal hematoma or prevertebral edema. Disc levels: Advanced disc degeneration with narrowing, spurring, and sclerosis from C3-4 to C6-7. Multilevel facet arthropathy. Upper chest: No acute finding. IMPRESSION: 1. No evidence of acute intracranial or cervical spine injury. 2. Stable exam compared to prior.  Incidental findings noted above. Electronically Signed   By: Monte Fantasia M.D.   On: 10/01/2016 16:50   Ct Cervical Spine Wo Contrast  Result Date: 10/01/2016 CLINICAL DATA:  Fall. Found on floor unresponsive. Initial encounter. EXAM: CT HEAD WITHOUT CONTRAST CT CERVICAL SPINE WITHOUT CONTRAST  TECHNIQUE: Multidetector CT imaging of the head and cervical spine was performed following the standard protocol without intravenous contrast. Multiplanar CT image reconstructions of the cervical spine were also generated. COMPARISON:  08/17/2016 FINDINGS: CT HEAD FINDINGS Brain: No evidence of acute infarction, hemorrhage, hydrocephalus, extra-axial collection or mass lesion/mass effect. Vascular: Atherosclerotic calcification. Skull: Negative for fracture Sinuses/Orbits: No acute finding. Mild secretions layering the sinuses. Left maxillary sinusitis is improved from comparison. Nasal septal perforation. CT CERVICAL SPINE FINDINGS Alignment: Reversal of cervical lordosis from degenerative disease. No traumatic malalignment when compared to prior. Skull base and vertebrae: Negative for acute fracture. T2 and T4 superior endplate concavities are chronic. Soft tissues and spinal canal: No gross canal hematoma or prevertebral edema. Disc levels: Advanced disc degeneration with narrowing, spurring, and sclerosis from C3-4 to C6-7. Multilevel facet arthropathy. Upper chest: No acute finding. IMPRESSION: 1. No evidence of acute intracranial or cervical spine injury. 2. Stable exam compared to prior.  Incidental findings noted above. Electronically Signed   By: Monte Fantasia M.D.   On: 10/01/2016 16:50   US Renal  Result Date: 10/04/2016 CLINICAL DATA:  Acute renal failure. EXAM: RENAL / URINARY TRACT ULTRASOUND COMPLETE COMPARISON:  Abdominal CT 07/06/2016 FINDINGS: Right Kidney: Length: 11.9 cm. Echogenicity within normal limits. No mass or hydronephrosis visualized. Left Kidney: Length: 12.3 cm. Echogenicity within normal limits. No mass or hydronephrosis visualized. Bladder: Bladder is decompressed with a catheter. IMPRESSION: Normal renal ultrasound. Electronically Signed   By: Markus Daft M.D.   On: 10/04/2016 13:35   Dg Pelvis Portable  Result Date: 10/01/2016 CLINICAL DATA:  Pain after fall. Initial  encounter. EXAM: PORTABLE PELVIS 1-2 VIEWS COMPARISON:  06/16/2016 FINDINGS: Status post ORIF of intertrochanteric left femur fracture. Hypertrophic healing has developed along the lower fracture margin when compared to prior. Dynamic hip screw has settled compared to prior, expected. Bipolar right hip hemiarthroplasty appears located and intact in the frontal projection. No visible pelvic ring fracture or diastasis. Lower lumbar disc degeneration. IMPRESSION: 1. No acute finding. 2. Right hip hemiarthroplasty and left femur intertrochanteric fracture ORIF. The left femur fracture shows progressive healing since 06/16/2016 comparison. If hip pain, two-view hip radiographs would increase sensitivity. Electronically Signed   By: Monte Fantasia M.D.   On: 10/01/2016 17:20   Dg Chest Port 1 View  Result Date: 10/02/2016 CLINICAL DATA:  Initial evaluation for central line placement. EXAM: PORTABLE CHEST 1 VIEW COMPARISON:  Prior radiograph from 07/05/2016. FINDINGS: There has been interval placement of a left IJ approach centra venous catheter. Catheter seen coursing across the expected location of the left  brachiocephalic vein and in cephalad into what is probably the superior SVC. Repositioning recommended. Cardiac and mediastinal silhouettes are stable, and remain within normal limits. Lungs are normally inflated. No focal infiltrates. No pulmonary edema or pleural effusion. No pneumothorax. No acute osseous abnormality. IMPRESSION: 1. Malpositioned left IJ approach central venous catheter with tip projecting superiorly, likely in the proximal SVC. Repositioning recommended. 2. Otherwise stable appearance of the chest with no active cardiopulmonary disease identified. These results will be called to the ordering clinician or representative by the Radiologist Assistant, and communication documented in the PACS or zVision Dashboard. Electronically Signed   By: Jeannine Boga M.D.   On: 10/02/2016 02:26     Discharge Exam: Vitals:   10/06/16 2155 10/07/16 0700  BP: (!) 150/69 (!) 160/78  Pulse: (!) 59 61  Resp: 17 17  Temp: 99.4 F (37.4 C) 98.6 F (37 C)   Vitals:   10/06/16 1655 10/06/16 1720 10/06/16 2155 10/07/16 0700  BP: 138/69  (!) 150/69 (!) 160/78  Pulse: 63 84 (!) 59 61  Resp:   17 17  Temp: 99 F (37.2 C)  99.4 F (37.4 C) 98.6 F (37 C)  TempSrc: Oral  Oral Oral  SpO2: 97%  99% 98%  Weight:      Height:        General: Pt is alert, awake, not in acute distress Cardiovascular: RRR, S1/S2 +, no rubs, no gallops Respiratory: CTA bilaterally, no wheezing, no rhonchi Abdominal: Soft, NT, ND, bowel sounds + Extremities: no edema, no cyanosis   The results of significant diagnostics from this hospitalization (including imaging, microbiology, ancillary and laboratory) are listed below for reference.     Microbiology: Recent Results (from the past 240 hour(s))  Culture, blood (routine x 2)     Status: None   Collection Time: 10/01/16  6:28 PM  Result Value Ref Range Status   Specimen Description RIGHT ANTECUBITAL  Final   Special Requests IN PEDIATRIC BOTTLE 3CC  Final   Culture NO GROWTH 5 DAYS  Final   Report Status 10/06/2016 FINAL  Final  Culture, blood (routine x 2)     Status: None   Collection Time: 10/01/16  6:33 PM  Result Value Ref Range Status   Specimen Description BLOOD LEFT HAND  Final   Special Requests BOTTLES DRAWN AEROBIC ONLY 10CC  Final   Culture NO GROWTH 5 DAYS  Final   Report Status 10/06/2016 FINAL  Final  MRSA PCR Screening     Status: None   Collection Time: 10/01/16  8:12 PM  Result Value Ref Range Status   MRSA by PCR NEGATIVE NEGATIVE Final    Comment:        The GeneXpert MRSA Assay (FDA approved for NASAL specimens only), is one component of a comprehensive MRSA colonization surveillance program. It is not intended to diagnose MRSA infection nor to guide or monitor treatment for MRSA infections.   Urine culture      Status: None   Collection Time: 10/01/16 10:05 PM  Result Value Ref Range Status   Specimen Description URINE, CATHETERIZED  Final   Special Requests NONE  Final   Culture NO GROWTH  Final   Report Status 10/03/2016 FINAL  Final     Labs: BNP (last 3 results) No results for input(s): BNP in the last 8760 hours. Basic Metabolic Panel:  Recent Labs Lab 10/02/16 0600  10/03/16 2356 10/04/16 1113 10/05/16 1052 10/06/16 0435 10/07/16 0526  NA 136  < >  137 137 139 138 139  K 4.3  < > 4.3 4.8 4.3 3.7 3.2*  CL 113*  < > 114* 114* 114* 109 109  CO2 14*  < > 15* 12* 19* 24 23  GLUCOSE 193*  < > 96 167* 151* 112* 49*  BUN 37*  < > 42* 40* 37* 31* 22*  CREATININE 4.21*  < > 4.75* 4.69* 3.73* 3.13* 2.06*  CALCIUM 7.9*  < > 7.6* 7.6* 7.5* 7.7* 8.0*  MG 1.9  --   --   --  1.7  --   --   PHOS 5.2*  --   --   --   --   --   --   < > = values in this interval not displayed. Liver Function Tests:  Recent Labs Lab 10/01/16 1824 10/02/16 0600  AST 101* 70*  ALT 49 37  ALKPHOS 138* 96  BILITOT 0.4 0.5  PROT 6.8 5.0*  ALBUMIN 3.6 2.5*   No results for input(s): LIPASE, AMYLASE in the last 168 hours. No results for input(s): AMMONIA in the last 168 hours. CBC:  Recent Labs Lab 10/01/16 1544 10/01/16 1617 10/02/16 0600 10/05/16 1052 10/06/16 0435  WBC 13.8*  --  9.9 5.6 6.6  NEUTROABS 12.1*  --   --  3.5  --   HGB 11.3* 13.6 9.6* 8.7* 8.7*  HCT 39.1 40.0 28.7* 25.9* 25.8*  MCV 104.8*  --  91.1 89.9 88.1  PLT 303  --  210 132* 133*   Cardiac Enzymes:  Recent Labs Lab 10/01/16 1544 10/01/16 1824 10/01/16 1921 10/02/16 0225 10/02/16 0721 10/03/16 1833 10/05/16 0516 10/07/16 0526  CKTOTAL  --  1,214*  --   --   --  697*  --  223  TROPONINI 0.05*  --  0.05* 0.05* 0.03*  --  <0.03  --    BNP: Invalid input(s): POCBNP CBG:  Recent Labs Lab 10/07/16 0104 10/07/16 0516 10/07/16 0619 10/07/16 0811 10/07/16 1210  GLUCAP 97 39* 120* 125* 309*   D-Dimer No  results for input(s): DDIMER in the last 72 hours. Hgb A1c No results for input(s): HGBA1C in the last 72 hours. Lipid Profile No results for input(s): CHOL, HDL, LDLCALC, TRIG, CHOLHDL, LDLDIRECT in the last 72 hours. Thyroid function studies No results for input(s): TSH, T4TOTAL, T3FREE, THYROIDAB in the last 72 hours.  Invalid input(s): FREET3 Anemia work up  Recent Labs  10/07/16 0526  VITAMINB12 436  FOLATE 14.8  FERRITIN 383*  TIBC 200*  IRON 49  RETICCTPCT 0.9   Urinalysis    Component Value Date/Time   COLORURINE STRAW (A) 10/03/2016 [place] 10/03/2016 1014   LABSPEC 1.006 10/03/2016 1014   PHURINE 5.0 10/03/2016 1014   GLUCOSEU 50 (A) 10/03/2016 1014   HGBUR LARGE (A) 10/03/2016 [place] 10/03/2016 1014   [place] 10/03/2016 1014   PROTEINUR 30 (A) 10/03/2016 1014   UROBILINOGEN 0.2 08/21/2015 2154   NITRITE NEGATIVE 10/03/2016 1014   LEUKOCYTESUR MODERATE (A) 10/03/2016 1014   Sepsis Labs Invalid input(s): PROCALCITONIN,  WBC,  LACTICIDVEN Microbiology Recent Results (from the past 240 hour(s))  Culture, blood (routine x 2)     Status: None   Collection Time: 10/01/16  6:28 PM  Result Value Ref Range Status   Specimen Description RIGHT ANTECUBITAL  Final   Special Requests IN PEDIATRIC BOTTLE 3CC  Final   Culture NO GROWTH 5 DAYS  Final   Report Status 10/06/2016  FINAL  Final  Culture, blood (routine x 2)     Status: None   Collection Time: 10/01/16  6:33 PM  Result Value Ref Range Status   Specimen Description BLOOD LEFT HAND  Final   Special Requests BOTTLES DRAWN AEROBIC ONLY 10CC  Final   Culture NO GROWTH 5 DAYS  Final   Report Status 10/06/2016 FINAL  Final  MRSA PCR Screening     Status: None   Collection Time: 10/01/16  8:12 PM  Result Value Ref Range Status   MRSA by PCR NEGATIVE NEGATIVE Final    Comment:        The GeneXpert MRSA Assay (FDA approved for NASAL specimens only), is one  component of a comprehensive MRSA colonization surveillance program. It is not intended to diagnose MRSA infection nor to guide or monitor treatment for MRSA infections.   Urine culture     Status: None   Collection Time: 10/01/16 10:05 PM  Result Value Ref Range Status   Specimen Description URINE, CATHETERIZED  Final   Special Requests NONE  Final   Culture NO GROWTH  Final   Report Status 10/03/2016 FINAL  Final     Time coordinating discharge: Over 30 minutes  SIGNED:  Chipper Oman, MD  [facility] 10/07/2016, 1:04 PM Pager   If 7PM-7AM, please contact night-coverage [email or web address] Password [email or web address]

## 2016-10-07 NOTE — Progress Notes (Signed)
Nsg Discharge Note  Admit Date:  10/01/2016 Discharge date: 10/07/2016   [name] to be D/C'd Home per MD order.  AVS completed.  Copy for chart, and copy for patient signed, and dated. Patient/caregiver able to verbalize understanding.  Discharge Medication: Allergies as of 10/07/2016   No Known Allergies     Medication List    TAKE these medications   acetaminophen 325 MG tablet Commonly known as:  TYLENOL Take 2 tablets (650 mg total) by mouth every 6 (six) hours as needed for mild pain (or Fever >/= 101).   amLODipine 10 MG tablet Commonly known as:  NORVASC Take 1 tablet (10 mg total) by mouth daily.   atorvastatin 10 MG tablet Commonly known as:  LIPITOR Take 1 tablet (10 mg total) by mouth daily at 6 PM.   carvedilol 6.25 MG tablet Commonly known as:  COREG Take 1 tablet (6.25 mg total) by mouth 2 (two) times daily with a meal.   cyanocobalamin 1000 MCG/ML injection Commonly known as:  (VITAMIN B-12) Inject 1 mL (1,000 mcg total) into the muscle every 30 (thirty) days.   HYDROcodone-acetaminophen 5-325 MG tablet Commonly known as:  NORCO/VICODIN Take 1 tablet by mouth every 4 (four) hours as needed for severe pain.   insulin aspart 100 UNIT/ML FlexPen Commonly known as:  NOVOLOG FLEXPEN Inject 0-12 Units into the skin 3 (three) times daily with meals. Per sliding scale, 0-150 = 0 units, 151-200 = 2 units, 201-250 = 4 units, 251-300 = 6 units, 301-350 = 8 units, 351-400 = 10 units, 401-500 = 12 units (call MD)   insulin glargine 100 UNIT/ML injection Commonly known as:  LANTUS Inject 0.1 mLs (10 Units total) into the skin daily.   polyethylene glycol powder powder Commonly known as:  GLYCOLAX/MIRALAX Take 17 g by mouth every morning.   rivaroxaban 20 MG Tabs tablet Commonly known as:  XARELTO Take 1 tablet (20 mg total) by mouth daily with supper. STARTING October 16.   senna 8.6 MG Tabs tablet Commonly known as:  SENOKOT Take 1 tablet (8.6 mg total)  by mouth 2 (two) times daily.   thiamine 100 MG tablet Take 1 tablet (100 mg total) by mouth daily.       Discharge Assessment: Vitals:   10/07/16 0700 10/07/16 1414  BP: (!) 160/78 (!) 149/75  Pulse: 61 (!) 58  Resp: 17 15  Temp: 98.6 F (37 C) 98.3 F (36.8 C)   Skin clean, dry and intact without evidence of skin break down, no evidence of skin tears noted. IV catheter discontinued intact. Site without signs and symptoms of complications - no redness or edema noted at insertion site, patient denies c/o pain - only slight tenderness at site.  Dressing with slight pressure applied.  D/c Instructions-Education: Discharge instructions given to patient/family with verbalized understanding. D/c education completed with patient/family including follow up instructions, medication list, d/c activities limitations if indicated, with other d/c instructions as indicated by MD - patient able to verbalize understanding, all questions fully answered. Patient instructed to return to ED, call 911, or call MD for any changes in condition.  Patient escorted via [place], and D/C home via private auto.  Salley Slaughter, RN 10/07/2016 4:40 PM

## 2016-10-07 NOTE — Progress Notes (Signed)
CSW received consult regarding PT recommendation of SNF at discharge.  Patient is refusing SNF and would like to return home with his daughter.  CSW signing off.   Percell Locus Demisha Nokes LCSWA [phone]

## 2016-10-07 NOTE — Care Management Note (Signed)
Case Management Note  Patient Details  Name: [name] [name] MRN: [id number] Date of Birth: [birth year]  Subjective/Objective:                 Spoke with patient at the bedside. Patient refusing SNF. From home alone, has help from daughter. Patient [place] 11/7 with HH through [facility]. CM spoke to patient about what he thought he needed in order to be safe and healthy at home. Patient would not identify any barriers to care, or anything that keeps sending him back to the hospital. 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    Action/Plan:  Anticipate DC to home with [facility] through [facility].   Expected Discharge Date:                  Expected Discharge Plan:  [place]  In-House Referral:     Discharge planning Services  CM Consult  Post Acute Care Choice:  Home Health Choice offered to:  Patient  DME Arranged:    DME Agency:     HH Arranged:  RN [place] Agency:  [facility]  Status of Service:  Completed, signed off  If discussed at [place] of Stay Meetings, dates discussed:    Additional Comments:  Carles Collet, RN 10/07/2016, 11:51 AM

## 2016-10-07 NOTE — Progress Notes (Signed)
S: Says he is eating well, No new CO O:BP (!) 160/78 (BP Location: Left Arm)   Pulse 61   Temp 98.6 F (37 C) (Oral)   Resp 17   Ht 5\' 9"  (1.753 m)   Wt 70.7 kg (155 lb 13.8 oz)   SpO2 98%   BMI 23.02 kg/m   Intake/Output Summary (Last 24 hours) at 10/07/16 1239 Last data filed at 10/07/16 0917  Gross per 24 hour  Intake              355 ml  Output             3600 ml  Net            -3245 ml   Weight change:  LAG:TXMIW and alert CVS:RRR Resp:clear Abd:+ BS NTND Ext:no edema NEURO:CNI Ox3 no asterixis   . amLODipine  10 mg Oral Daily  . carvedilol  6.25 mg Oral BID WC  . heparin  5,000 Units Subcutaneous Q8H  . insulin aspart  0-9 Units Subcutaneous TID WC  . insulin glargine  12 Units Subcutaneous Daily  . potassium chloride  60 mEq Oral Once  . sodium chloride flush  10-40 mL Intracatheter Q12H   No results found. BMET    Component Value Date/Time   NA 139 10/07/2016 0526   K 3.2 (L) 10/07/2016 0526   CL 109 10/07/2016 0526   CO2 23 10/07/2016 0526   GLUCOSE 49 (L) 10/07/2016 0526   BUN 22 (H) 10/07/2016 0526   CREATININE 2.06 (H) 10/07/2016 0526   CALCIUM 8.0 (L) 10/07/2016 0526   GFRNONAA 31 (L) 10/07/2016 0526   GFRAA 36 (L) 10/07/2016 0526   CBC    Component Value Date/Time   WBC 6.6 10/06/2016 0435   RBC 2.98 (L) 10/07/2016 0526   RBC 2.93 (L) 10/06/2016 0435   HGB 8.7 (L) 10/06/2016 0435   HCT 25.8 (L) 10/06/2016 0435   PLT 133 (L) 10/06/2016 0435   MCV 88.1 10/06/2016 0435   MCH 29.7 10/06/2016 0435   MCHC 33.7 10/06/2016 0435   RDW 14.2 10/06/2016 0435   LYMPHSABS 1.4 10/05/2016 1052   MONOABS 0.4 10/05/2016 1052   EOSABS 0.3 10/05/2016 1052   BASOSABS 0.0 10/05/2016 1052     Assessment: 1. Acute renal failure presumably due to ATN from hypotension +/- rhabdo.  UO excellent, Scr trending down 2. DM poorly controlled 3. Hypotension resolved now sl hypertensive  Plan: 1. Anticipate renal fx to return to baseline.  I would avoid  ACE/ARB given his history 2. Will sign off, he can FU with his PCP   [name] [name]

## 2016-10-08 ENCOUNTER — Telehealth: Payer: Self-pay | Admitting: *Deleted

## 2016-10-08 NOTE — Telephone Encounter (Signed)
Transition Care Management Follow-up Telephone Call  Date discharged? 10/07/16   How have you been since you were released from the hospital? Pt states he is ok   Do you understand why you were in the hospital? YES   Do you understand the discharge instructions? YES   Where were you discharged to? Home   Items Reviewed:  Medications reviewed: YES  Allergies reviewed: YES  Dietary changes reviewed: NO  Referrals reviewed: No referral needed   Functional Questionnaire:   Activities of Daily Living (ADLs):   He states he are independent in the following: ambulation, bathing and hygiene, feeding, continence, grooming, toileting and dressing States he doesn't require assistance   Any transportation issues/concerns?: NO   Any patient concerns? YES  Confirmed importance and date/time of follow-up visits scheduled YES, someone had made appt for 1:30 only 15 mins on 10/20/16 change to a 30 min appt. Inform pt to keep same appt   Provider Appointment booked with Terri Piedra  Confirmed with patient if condition begins to worsen call PCP or go to the ER.  Patient was given the office number and encouraged to call back with question or concerns.  : YES

## 2016-10-13 IMAGING — CR DG RIBS W/ CHEST 3+V*L*
5 series · 5 of 5 positions shown · non-contrast
Comparison: Chest x-ray dated 07/05/2015.

CLINICAL DATA: Fall, mid axillary rib pain as marked.

EXAM:
LEFT RIBS AND CHEST - 3+ VIEW

[t ribs ap upper left]
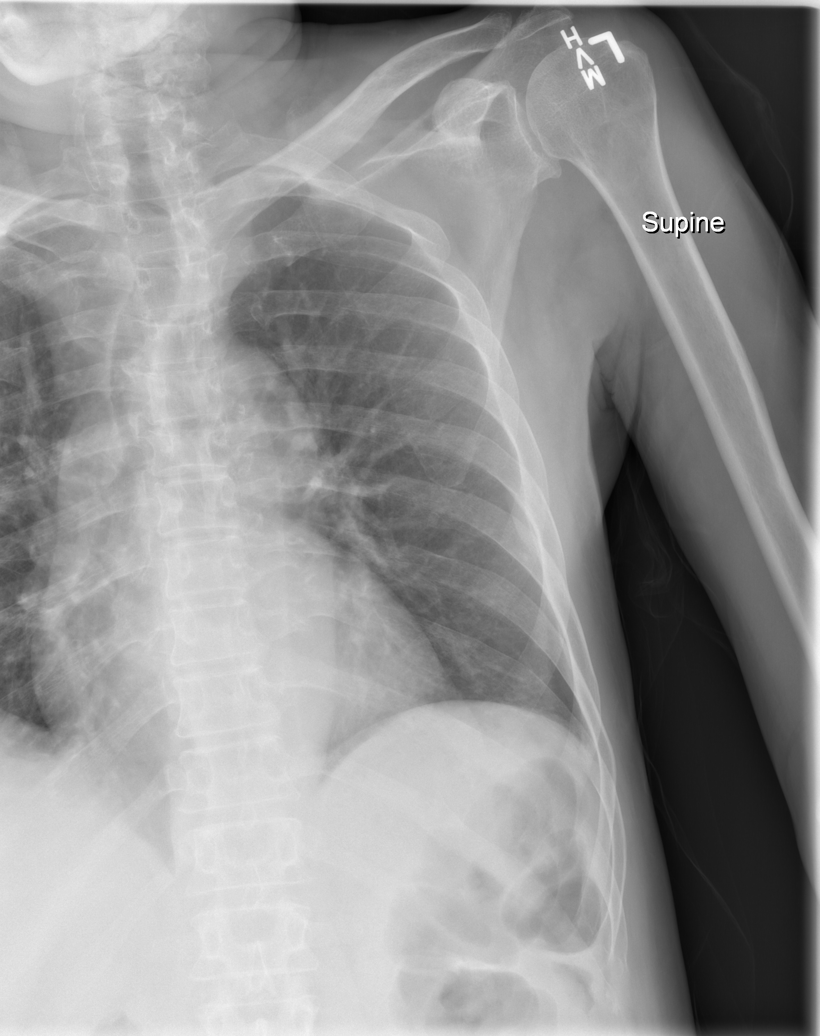

[t ribs ap lower left]
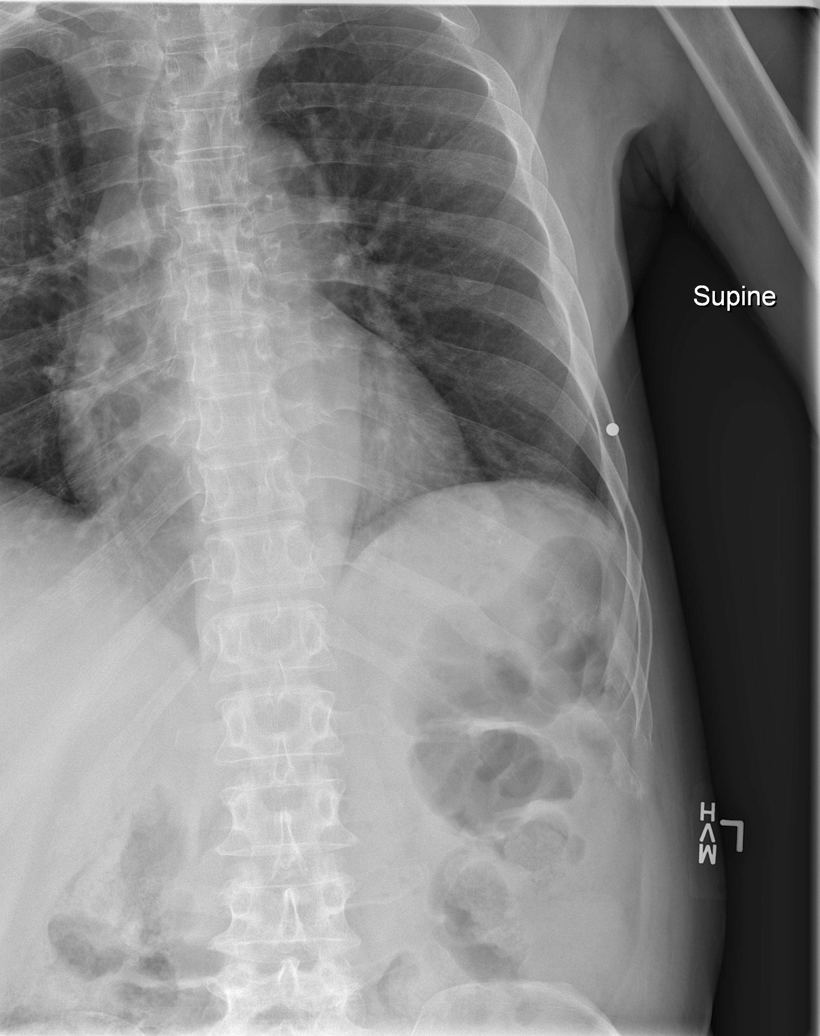

[t ribs lpo left (1 of 2)]
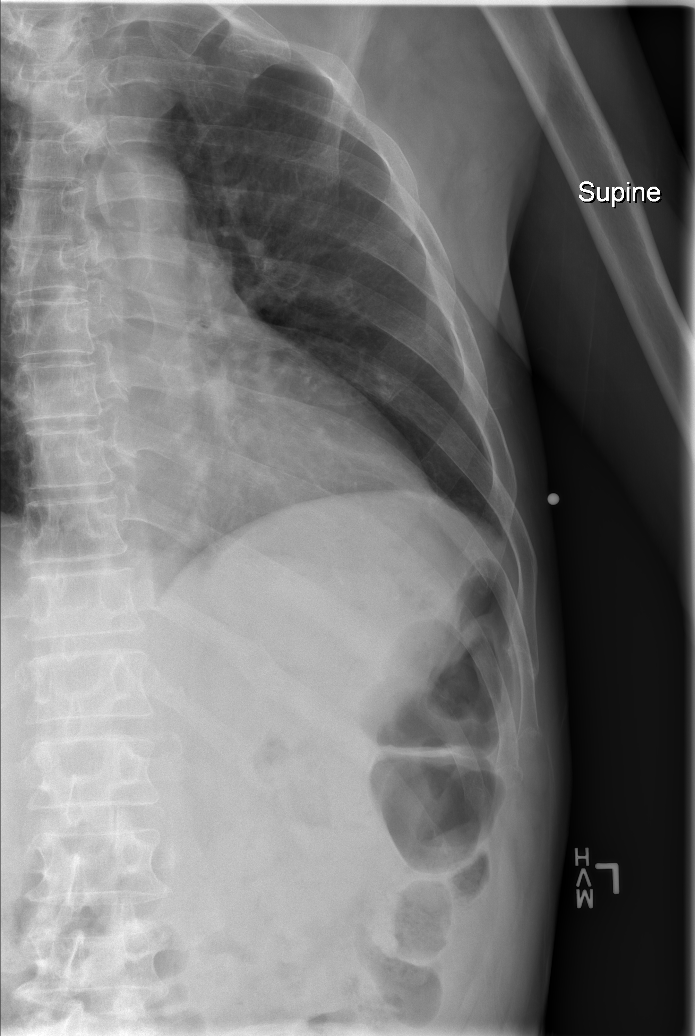

[t ribs lpo left (2 of 2)]
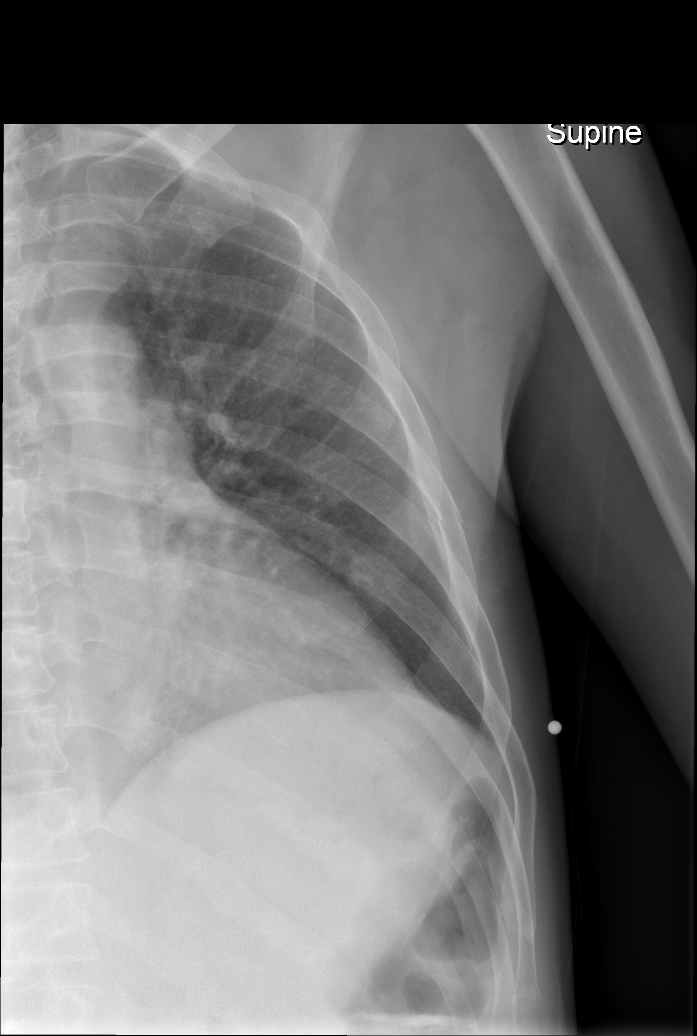

[t chest supine]
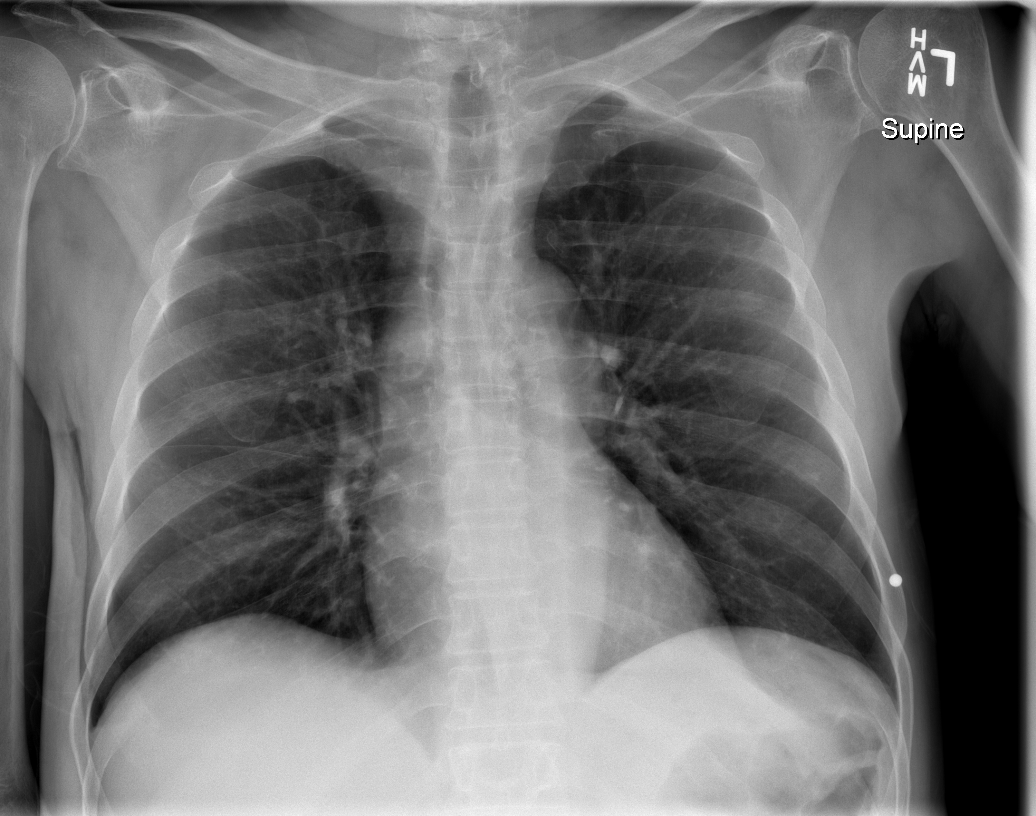

[5 of 5 positions shown; findings below may reference images not displayed]

FINDINGS: Single view of the chest and four views of the left ribs are
provided. There is a slightly displaced fracture of the left lateral
fifth rib. No other rib fracture seen.

Cardiomediastinal silhouette remains normal in size and
configuration. Lungs are clear. No pleural effusion seen. No
pneumothorax seen. Lung volumes are normal.
IMPRESSION: Slightly displaced fracture of the left lateral fifth rib.

## 2016-10-14 IMAGING — CR DG CHEST 1V PORT
1 series · 1 of 1 positions shown · non-contrast
Comparison: Chest radiograph August 07, 2015

CLINICAL DATA: Sepsis. History of diabetes, hypertension and
stroke.

EXAM:
PORTABLE CHEST 1 VIEW

[AP]
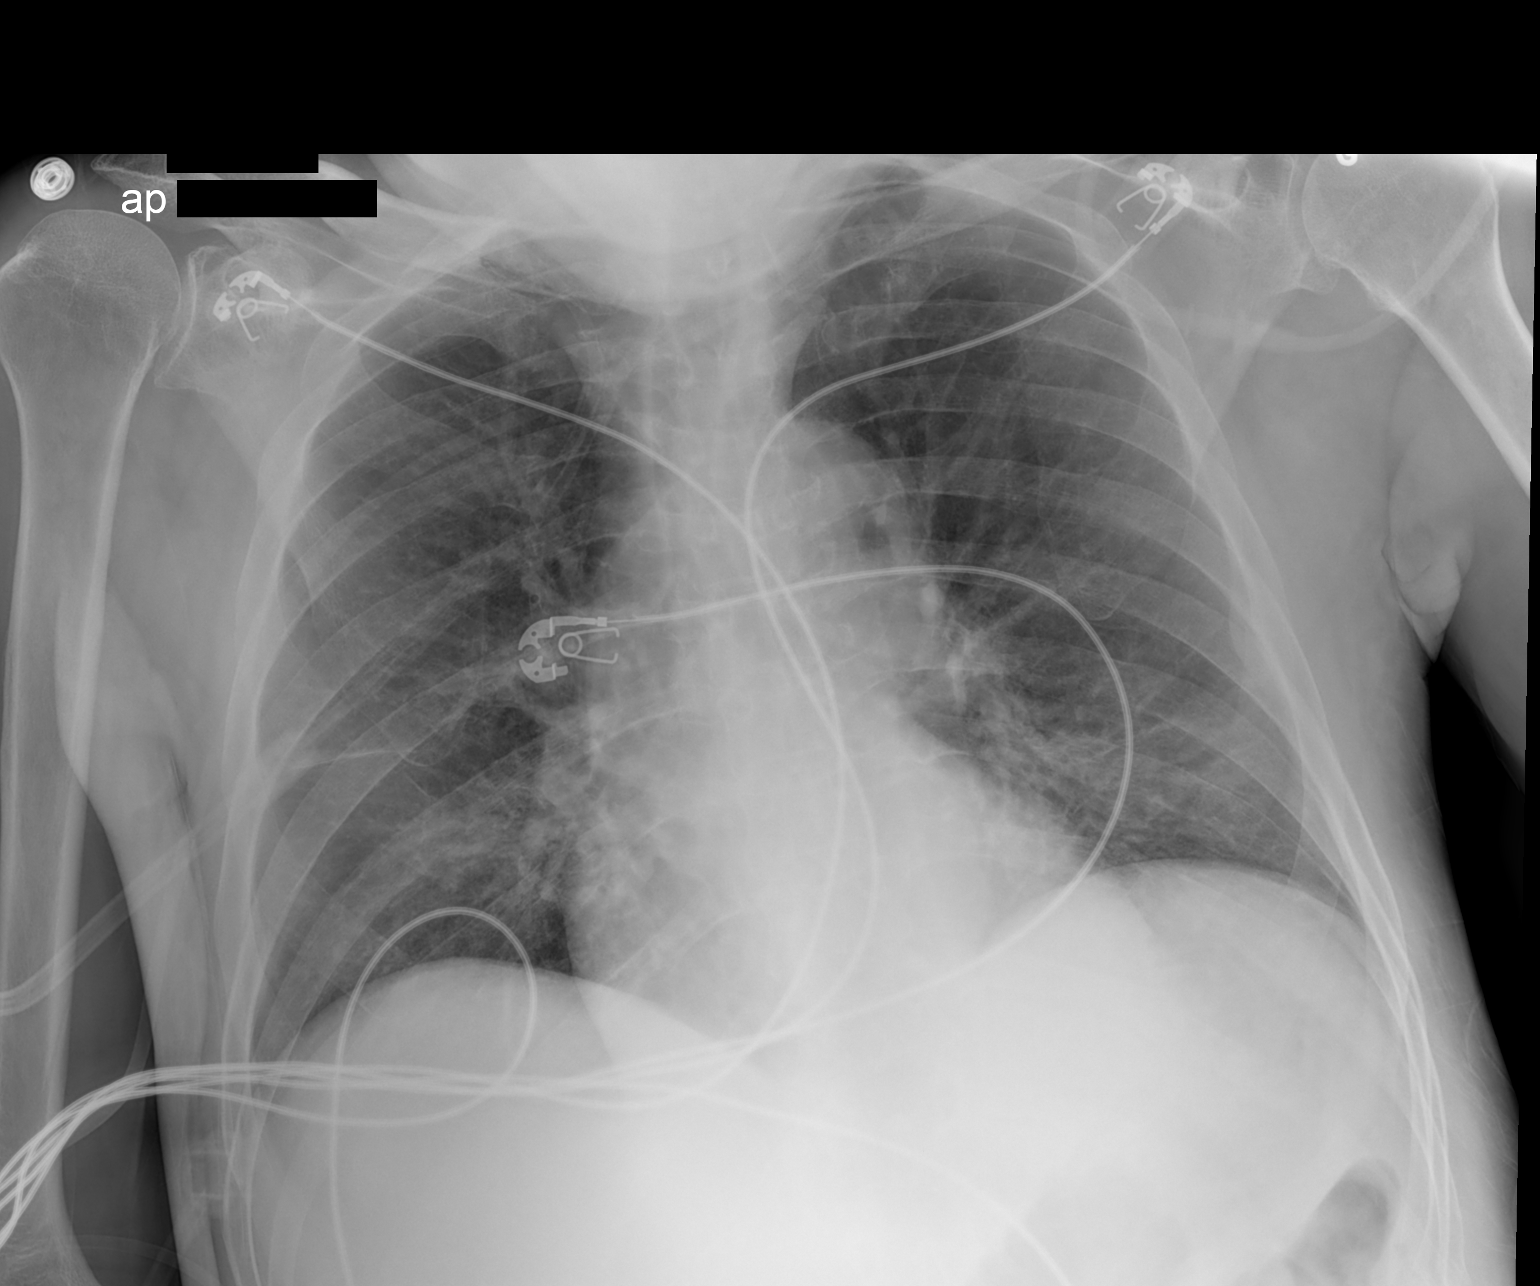

[1 of 1 positions shown; findings below may reference images not displayed]

FINDINGS: Patchy RIGHT middle lobe airspace opacity. Strandy densities LEFT
lung base. No pleural effusion. Cardiac silhouette is normal. Mildly
tortuous, possibly ectatic aorta.

Focal pleural thickening associated with LEFT rib fracture which was
documented on yesterday's radiographs.
IMPRESSION: RIGHT middle lobe atelectasis/ pneumonia. LEFT lung base
atelectasis.

## 2016-10-20 ENCOUNTER — Inpatient Hospital Stay: Payer: Medicare HMO | Admitting: Family

## 2016-11-14 ENCOUNTER — Inpatient Hospital Stay: Payer: Medicare HMO | Admitting: Family

## 2016-11-14 ENCOUNTER — Telehealth: Payer: Self-pay

## 2016-11-14 NOTE — Telephone Encounter (Signed)
Dismissal forms initiated per PCP's request due to 3 or more no shows. Sending in the mail.

## 2016-11-16 ENCOUNTER — Emergency Department (HOSPITAL_COMMUNITY): Payer: Medicare HMO

## 2016-11-16 ENCOUNTER — Inpatient Hospital Stay (HOSPITAL_COMMUNITY)
Admission: EM | Admit: 2016-11-16 | Discharge: 2016-11-20 | DRG: 638 | Disposition: A | Discharge status: Skilled Nursing Facility | Payer: Medicare HMO | Attending: Internal Medicine | Admitting: Internal Medicine

## 2016-11-16 ENCOUNTER — Encounter (HOSPITAL_COMMUNITY): Payer: Self-pay | Admitting: Emergency Medicine

## 2016-11-16 DIAGNOSIS — D631 Anemia in chronic kidney disease: Secondary | ICD-10-CM | POA: Diagnosis present

## 2016-11-16 DIAGNOSIS — Z79899 Other long term (current) drug therapy: Secondary | ICD-10-CM

## 2016-11-16 DIAGNOSIS — N183 Chronic kidney disease, stage 3 (moderate): Secondary | ICD-10-CM | POA: Diagnosis present

## 2016-11-16 DIAGNOSIS — Z8249 Family history of ischemic heart disease and other diseases of the circulatory system: Secondary | ICD-10-CM | POA: Diagnosis not present

## 2016-11-16 DIAGNOSIS — R7989 Other specified abnormal findings of blood chemistry: Secondary | ICD-10-CM | POA: Diagnosis present

## 2016-11-16 DIAGNOSIS — K921 Melena: Secondary | ICD-10-CM | POA: Diagnosis present

## 2016-11-16 DIAGNOSIS — Z9114 Patient's other noncompliance with medication regimen: Secondary | ICD-10-CM | POA: Diagnosis not present

## 2016-11-16 DIAGNOSIS — F419 Anxiety disorder, unspecified: Secondary | ICD-10-CM | POA: Diagnosis present

## 2016-11-16 DIAGNOSIS — IMO0002 Reserved for concepts with insufficient information to code with codable children: Secondary | ICD-10-CM

## 2016-11-16 DIAGNOSIS — E1310 Other specified diabetes mellitus with ketoacidosis without coma: Secondary | ICD-10-CM | POA: Diagnosis not present

## 2016-11-16 DIAGNOSIS — E1165 Type 2 diabetes mellitus with hyperglycemia: Secondary | ICD-10-CM

## 2016-11-16 DIAGNOSIS — N185 Chronic kidney disease, stage 5: Secondary | ICD-10-CM | POA: Diagnosis present

## 2016-11-16 DIAGNOSIS — Z833 Family history of diabetes mellitus: Secondary | ICD-10-CM

## 2016-11-16 DIAGNOSIS — Z86718 Personal history of other venous thrombosis and embolism: Secondary | ICD-10-CM

## 2016-11-16 DIAGNOSIS — R531 Weakness: Secondary | ICD-10-CM | POA: Diagnosis present

## 2016-11-16 DIAGNOSIS — Z8673 Personal history of transient ischemic attack (TIA), and cerebral infarction without residual deficits: Secondary | ICD-10-CM | POA: Diagnosis not present

## 2016-11-16 DIAGNOSIS — N179 Acute kidney failure, unspecified: Secondary | ICD-10-CM | POA: Diagnosis present

## 2016-11-16 DIAGNOSIS — E1122 Type 2 diabetes mellitus with diabetic chronic kidney disease: Secondary | ICD-10-CM | POA: Diagnosis present

## 2016-11-16 DIAGNOSIS — Z794 Long term (current) use of insulin: Secondary | ICD-10-CM | POA: Diagnosis not present

## 2016-11-16 DIAGNOSIS — E1110 Type 2 diabetes mellitus with ketoacidosis without coma: Principal | ICD-10-CM | POA: Diagnosis present

## 2016-11-16 DIAGNOSIS — E876 Hypokalemia: Secondary | ICD-10-CM | POA: Diagnosis present

## 2016-11-16 DIAGNOSIS — Z87891 Personal history of nicotine dependence: Secondary | ICD-10-CM

## 2016-11-16 DIAGNOSIS — E785 Hyperlipidemia, unspecified: Secondary | ICD-10-CM | POA: Diagnosis present

## 2016-11-16 DIAGNOSIS — I129 Hypertensive chronic kidney disease with stage 1 through stage 4 chronic kidney disease, or unspecified chronic kidney disease: Secondary | ICD-10-CM | POA: Diagnosis present

## 2016-11-16 DIAGNOSIS — E1121 Type 2 diabetes mellitus with diabetic nephropathy: Secondary | ICD-10-CM

## 2016-11-16 DIAGNOSIS — Z96641 Presence of right artificial hip joint: Secondary | ICD-10-CM | POA: Diagnosis present

## 2016-11-16 DIAGNOSIS — E860 Dehydration: Secondary | ICD-10-CM

## 2016-11-16 DIAGNOSIS — I10 Essential (primary) hypertension: Secondary | ICD-10-CM | POA: Diagnosis not present

## 2016-11-16 DIAGNOSIS — Z7901 Long term (current) use of anticoagulants: Secondary | ICD-10-CM | POA: Diagnosis not present

## 2016-11-16 DIAGNOSIS — N184 Chronic kidney disease, stage 4 (severe): Secondary | ICD-10-CM

## 2016-11-16 DIAGNOSIS — R945 Abnormal results of liver function studies: Secondary | ICD-10-CM

## 2016-11-16 LAB — POC OCCULT BLOOD, ED: Fecal Occult Bld: POSITIVE — AB

## 2016-11-16 LAB — CBC
HCT: 36.8 % — ABNORMAL LOW (ref 39.0–52.0)
HCT: 32.8 % — ABNORMAL LOW (ref 39.0–52.0)
HCT: 29.4 % — ABNORMAL LOW (ref 39.0–52.0)
Hemoglobin: 10.6 g/dL — ABNORMAL LOW (ref 13.0–17.0)
Hemoglobin: 10.5 g/dL — ABNORMAL LOW (ref 13.0–17.0)
Hemoglobin: 9.7 g/dL — ABNORMAL LOW (ref 13.0–17.0)
MCH: 30.5 pg (ref 26.0–34.0)
MCH: 30.9 pg (ref 26.0–34.0)
MCH: 29.8 pg (ref 26.0–34.0)
MCHC: 28.8 g/dL — ABNORMAL LOW (ref 30.0–36.0)
MCHC: 32 g/dL (ref 30.0–36.0)
MCHC: 33 g/dL (ref 30.0–36.0)
MCV: 105.7 fL — ABNORMAL HIGH (ref 78.0–100.0)
MCV: 96.5 fL (ref 78.0–100.0)
MCV: 90.5 fL (ref 78.0–100.0)
Platelets: 148 10*3/uL — ABNORMAL LOW (ref 150–400)
Platelets: 199 10*3/uL (ref 150–400)
Platelets: 186 10*3/uL (ref 150–400)
RBC: 3.48 MIL/uL — ABNORMAL LOW (ref 4.22–5.81)
RBC: 3.4 MIL/uL — ABNORMAL LOW (ref 4.22–5.81)
RBC: 3.25 MIL/uL — ABNORMAL LOW (ref 4.22–5.81)
RDW: 17.5 % — ABNORMAL HIGH (ref 11.5–15.5)
RDW: 16.3 % — ABNORMAL HIGH (ref 11.5–15.5)
RDW: 15.1 % (ref 11.5–15.5)
WBC: 5.8 10*3/uL (ref 4.0–10.5)
WBC: 6.7 10*3/uL (ref 4.0–10.5)
WBC: 7.8 10*3/uL (ref 4.0–10.5)

## 2016-11-16 LAB — BASIC METABOLIC PANEL
Anion gap: 24 — ABNORMAL HIGH (ref 5–15)
Anion gap: 23 — ABNORMAL HIGH (ref 5–15)
Anion gap: 24 — ABNORMAL HIGH (ref 5–15)
Anion gap: 19 — ABNORMAL HIGH (ref 5–15)
Anion gap: 15 (ref 5–15)
BUN: 71 mg/dL — ABNORMAL HIGH (ref 6–20)
BUN: 68 mg/dL — ABNORMAL HIGH (ref 6–20)
BUN: 66 mg/dL — ABNORMAL HIGH (ref 6–20)
BUN: 62 mg/dL — ABNORMAL HIGH (ref 6–20)
BUN: 60 mg/dL — ABNORMAL HIGH (ref 6–20)
CO2: 7 mmol/L — ABNORMAL LOW (ref 22–32)
CO2: <7 mmol/L — ABNORMAL LOW (ref 22–32)
CO2: <7 mmol/L — ABNORMAL LOW (ref 22–32)
CO2: 9 mmol/L — ABNORMAL LOW (ref 22–32)
CO2: 11 mmol/L — ABNORMAL LOW (ref 22–32)
Calcium: 7.8 mg/dL — ABNORMAL LOW (ref 8.9–10.3)
Calcium: 7.3 mg/dL — ABNORMAL LOW (ref 8.9–10.3)
Calcium: 7.4 mg/dL — ABNORMAL LOW (ref 8.9–10.3)
Calcium: 7.7 mg/dL — ABNORMAL LOW (ref 8.9–10.3)
Calcium: 7.4 mg/dL — ABNORMAL LOW (ref 8.9–10.3)
Chloride: 99 mmol/L — ABNORMAL LOW (ref 101–111)
Chloride: 103 mmol/L (ref 101–111)
Chloride: 108 mmol/L (ref 101–111)
Chloride: 111 mmol/L (ref 101–111)
Chloride: 112 mmol/L — ABNORMAL HIGH (ref 101–111)
Creatinine, Ser: 3.56 mg/dL — ABNORMAL HIGH (ref 0.61–1.24)
Creatinine, Ser: 3.62 mg/dL — ABNORMAL HIGH (ref 0.61–1.24)
Creatinine, Ser: 3.35 mg/dL — ABNORMAL HIGH (ref 0.61–1.24)
Creatinine, Ser: 3.26 mg/dL — ABNORMAL HIGH (ref 0.61–1.24)
Creatinine, Ser: 2.96 mg/dL — ABNORMAL HIGH (ref 0.61–1.24)
GFR calc Af Amer: 18 mL/min — ABNORMAL LOW (ref >60)
GFR calc Af Amer: 18 mL/min — ABNORMAL LOW (ref >60)
GFR calc Af Amer: 20 mL/min — ABNORMAL LOW (ref >60)
GFR calc Af Amer: 20 mL/min — ABNORMAL LOW (ref >60)
GFR calc Af Amer: 23 mL/min — ABNORMAL LOW (ref >60)
GFR calc non Af Amer: 16 mL/min — ABNORMAL LOW (ref >60)
GFR calc non Af Amer: 16 mL/min — ABNORMAL LOW (ref >60)
GFR calc non Af Amer: 17 mL/min — ABNORMAL LOW (ref >60)
GFR calc non Af Amer: 18 mL/min — ABNORMAL LOW (ref >60)
GFR calc non Af Amer: 20 mL/min — ABNORMAL LOW (ref >60)
Glucose, Bld: 1158 mg/dL (ref 65–99)
Glucose, Bld: 800 mg/dL (ref 65–99)
Glucose, Bld: 499 mg/dL — ABNORMAL HIGH (ref 65–99)
Glucose, Bld: 242 mg/dL — ABNORMAL HIGH (ref 65–99)
Glucose, Bld: 195 mg/dL — ABNORMAL HIGH (ref 65–99)
Potassium: 5.2 mmol/L — ABNORMAL HIGH (ref 3.5–5.1)
Potassium: 3.6 mmol/L (ref 3.5–5.1)
Potassium: 3.5 mmol/L (ref 3.5–5.1)
Potassium: 3.5 mmol/L (ref 3.5–5.1)
Potassium: 3.7 mmol/L (ref 3.5–5.1)
Sodium: 130 mmol/L — ABNORMAL LOW (ref 135–145)
Sodium: 132 mmol/L — ABNORMAL LOW (ref 135–145)
Sodium: 138 mmol/L (ref 135–145)
Sodium: 139 mmol/L (ref 135–145)
Sodium: 138 mmol/L (ref 135–145)

## 2016-11-16 LAB — CBG MONITORING, ED
Glucose-Capillary: >600 mg/dL (ref 65–99)
Glucose-Capillary: >600 mg/dL (ref 65–99)
Glucose-Capillary: 586 mg/dL (ref 65–99)
Glucose-Capillary: 433 mg/dL — ABNORMAL HIGH (ref 65–99)
Glucose-Capillary: 328 mg/dL — ABNORMAL HIGH (ref 65–99)
Glucose-Capillary: 204 mg/dL — ABNORMAL HIGH (ref 65–99)

## 2016-11-16 LAB — PHOSPHORUS: Phosphorus: 4.5 mg/dL (ref 2.5–4.6)

## 2016-11-16 LAB — GLUCOSE, CAPILLARY
Glucose-Capillary: 164 mg/dL — ABNORMAL HIGH (ref 65–99)
Glucose-Capillary: 132 mg/dL — ABNORMAL HIGH (ref 65–99)
Glucose-Capillary: 122 mg/dL — ABNORMAL HIGH (ref 65–99)
Glucose-Capillary: 136 mg/dL — ABNORMAL HIGH (ref 65–99)
Glucose-Capillary: 156 mg/dL — ABNORMAL HIGH (ref 65–99)
Glucose-Capillary: 143 mg/dL — ABNORMAL HIGH (ref 65–99)

## 2016-11-16 LAB — I-STAT ARTERIAL BLOOD GAS, ED
Acid-base deficit: 24 mmol/L — ABNORMAL HIGH (ref 0.0–2.0)
Bicarbonate: 4.2 mmol/L — ABNORMAL LOW (ref 20.0–28.0)
O2 Saturation: 99 %
Patient temperature: 98.6
TCO2: <5 mmol/L (ref 0–100)
pCO2 arterial: 15 mmHg — CL (ref 32.0–48.0)
pH, Arterial: 7.056 — CL (ref 7.350–7.450)
pO2, Arterial: 168 mmHg — ABNORMAL HIGH (ref 83.0–108.0)

## 2016-11-16 LAB — HEPATIC FUNCTION PANEL
ALT: 119 U/L — ABNORMAL HIGH (ref 17–63)
AST: 140 U/L — ABNORMAL HIGH (ref 15–41)
Albumin: 2.8 g/dL — ABNORMAL LOW (ref 3.5–5.0)
Alkaline Phosphatase: 228 U/L — ABNORMAL HIGH (ref 38–126)
Bilirubin, Direct: 0.1 mg/dL (ref 0.1–0.5)
Indirect Bilirubin: 0.6 mg/dL (ref 0.3–0.9)
Total Bilirubin: 0.7 mg/dL (ref 0.3–1.2)
Total Protein: 5.8 g/dL — ABNORMAL LOW (ref 6.5–8.1)

## 2016-11-16 LAB — MRSA PCR SCREENING: MRSA by PCR: NEGATIVE

## 2016-11-16 LAB — MAGNESIUM: Magnesium: 2 mg/dL (ref 1.7–2.4)

## 2016-11-16 MED ORDER — DEXTROSE-NACL 5-0.45 % IV SOLN: INTRAVENOUS | Status: DC

## 2016-11-16 MED ORDER — RIVAROXABAN 20 MG PO TABS: 20.0000 mg | ORAL_TABLET | Freq: Every day | ORAL | Status: DC

## 2016-11-16 MED ORDER — VITAMIN B-1 100 MG PO TABS
100.0000 mg | ORAL_TABLET | Freq: Every day | ORAL | Status: DC
  Administered 2016-11-16 – 2016-11-20 (×5): 100 mg via ORAL
  Filled 2016-11-16 (×5): qty 1

## 2016-11-16 MED ORDER — CARVEDILOL 6.25 MG PO TABS
6.2500 mg | ORAL_TABLET | Freq: Two times a day (BID) | ORAL | Status: DC
  Administered 2016-11-16 – 2016-11-20 (×9): 6.25 mg via ORAL
  Filled 2016-11-16 (×9): qty 1

## 2016-11-16 MED ORDER — STERILE WATER FOR INJECTION IV SOLN
INTRAVENOUS | Status: DC
  Administered 2016-11-16 – 2016-11-20 (×7): via INTRAVENOUS
  Filled 2016-11-16 (×13): qty 850

## 2016-11-16 MED ORDER — SODIUM CHLORIDE 0.9 % IV SOLN: INTRAVENOUS | Status: DC

## 2016-11-16 MED ORDER — FENTANYL CITRATE (PF) 100 MCG/2ML IJ SOLN
50.0000 ug | INTRAMUSCULAR | Status: DC | PRN
  Administered 2016-11-16 – 2016-11-20 (×2): 50 ug via INTRAVENOUS
  Filled 2016-11-16 (×2): qty 2

## 2016-11-16 MED ORDER — SODIUM CHLORIDE 0.9 % IV SOLN
INTRAVENOUS | Status: DC
  Administered 2016-11-16: 15:00:00 via INTRAVENOUS

## 2016-11-16 MED ORDER — SODIUM CHLORIDE 0.9 % IV BOLUS (SEPSIS)
1000.0000 mL | Freq: Once | INTRAVENOUS | Status: AC
  Administered 2016-11-16: 1000 mL via INTRAVENOUS

## 2016-11-16 MED ORDER — SODIUM CHLORIDE 0.9 % IV SOLN
INTRAVENOUS | Status: DC
  Administered 2016-11-16: 5.4 [IU]/h via INTRAVENOUS
  Administered 2016-11-17: 0.5 [IU]/h via INTRAVENOUS
  Filled 2016-11-16: qty 2.5

## 2016-11-16 MED ORDER — SODIUM CHLORIDE 0.9 % IV SOLN
INTRAVENOUS | Status: AC
  Administered 2016-11-16: 12:00:00 via INTRAVENOUS

## 2016-11-16 MED ORDER — FLUOXETINE HCL 20 MG PO CAPS
20.0000 mg | ORAL_CAPSULE | Freq: Every day | ORAL | Status: DC
  Administered 2016-11-16 – 2016-11-20 (×5): 20 mg via ORAL
  Filled 2016-11-16 (×5): qty 1

## 2016-11-16 MED ORDER — DEXTROSE-NACL 5-0.45 % IV SOLN
INTRAVENOUS | Status: DC
  Administered 2016-11-16 – 2016-11-17 (×3): via INTRAVENOUS

## 2016-11-16 MED ORDER — HYDROCODONE-ACETAMINOPHEN 5-325 MG PO TABS
1.0000 | ORAL_TABLET | ORAL | Status: DC | PRN
  Administered 2016-11-17: 1 via ORAL
  Filled 2016-11-16: qty 1

## 2016-11-16 MED ORDER — RAMIPRIL 10 MG PO CAPS: 10.0000 mg | ORAL_CAPSULE | Freq: Every day | ORAL | Status: DC

## 2016-11-16 NOTE — ED Notes (Signed)
Pt aware that we need a urine sample; urinal at bedside

## 2016-11-16 NOTE — ED Notes (Signed)
IV team member arrived at bedside.

## 2016-11-16 NOTE — ED Notes (Signed)
IV team still at bedside attempting IV

## 2016-11-16 NOTE — ED Notes (Signed)
CBG>600; RN aware

## 2016-11-16 NOTE — Progress Notes (Signed)
NP Schoor notified of black watery stool, pt had positive hemeoccult earlier in the day. VSS, will continue to monitor.

## 2016-11-16 NOTE — Progress Notes (Signed)
[facility] Update Note Called to bedside about bright red blood in stool per nursing. Per daughter no history of lower GI bleed. On exam patient has one large hemorrhoid at 12:00 it does not appear to be actively bleeding. Normal anal wink normal anal sphincter tone. Prostate normal size. No masses felt internally. No stool in rectal vault. Brown stool on glove. Will get follow-up CBC.  Elwin Mocha MD

## 2016-11-16 NOTE — ED Triage Notes (Signed)
Arrived via EMS family called 911 for patient who has a blood sugar reading high for the past month. Patient does not take his medication and family assist to administer. Patient general weakness increasing with increased respiratory rate per family.  Alert answering questions appropriate.

## 2016-11-16 NOTE — ED Notes (Signed)
Lab called critical lab glucose 1158 reported to EDP.

## 2016-11-16 NOTE — ED Provider Notes (Signed)
[facility] DEPT Provider Note   CSN: [id number] Arrival date & time: 11/16/16  0919     History   Chief Complaint Chief Complaint  Patient presents with  . Hyperglycemia    HPI [name] [name] is a 71 y.o. male.  Patient c/o high blood sugar. Hx iddm, and non compliance w meds. Lives independently, family checks on. Family has noted generally slow decline, poor po intake, unspecified wt loss, non compliance w meds, and states sugar high this past week. Patient denies vomiting or diarrhea. Denies cough or uri c/o. No chest pain or discomfort. No fever or chills. +generally weak.    The history is provided by the patient.  Hyperglycemia  Associated symptoms: weakness   Associated symptoms: no abdominal pain, no chest pain, no confusion, no fever, no increased thirst, no polyuria, no shortness of breath and no vomiting     Past Medical History:  Diagnosis Date  . 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 Closed comminuted intertrochanteric fracture of left femur ([place])   . Diabetes mellitus without complication ([place])   . Diabetic hyperosmolar non-ketotic state ([place]) 05/15/2016  . DKA (diabetic ketoacidoses) ([place]) 05/09/2016  . Hypertension   . Postoperative anemia due to acute blood loss 06/18/2016  . Retroperitoneal hematoma 06/18/2016  . Vitamin B 12 deficiency 06/18/2016    Patient Active Problem List   Diagnosis Date Noted  . Acute renal failure with acute tubular necrosis superimposed on stage 3 chronic kidney disease ([place])   . Type 2 diabetes mellitus with ketoacidotic coma, without long-term current use of insulin   . Elevated troponin   . Deep vein thrombosis (DVT) of left lower extremity ([place])   . Uncontrolled type 2 diabetes mellitus with complication ([place])   . Encephalopathy, metabolic   . Acute renal failure superimposed on stage 3 chronic kidney disease ([place])   . Idiopathic hypotension   . Non-traumatic rhabdomyolysis    . Diabetic acidosis without coma ([place]) 10/01/2016  . Fall 08/17/2016  . Rhabdomyolysis 08/17/2016  . Noncompliance with medication regimen 08/17/2016  . On continuous oral anticoagulation 08/17/2016  . Syncope and collapse 07/06/2016  . Left leg DVT ([place]) 07/06/2016  . Syncope 07/05/2016  . Postoperative anemia due to acute blood loss 06/18/2016  . Retroperitoneal hematoma 06/18/2016  . Vitamin B 12 deficiency 06/18/2016  . Elective surgery   . Postop check   . Closed comminuted intertrochanteric fracture of left femur ([place])   . Renal insufficiency   . Hyponatremia 05/15/2016  . Anxiety 05/15/2016  . History of CVA (cerebrovascular accident) 05/15/2016  . Acute on chronic renal failure ([place]) 05/09/2016  . Hypoglycemia 11/02/2015  . Chronic prescription benzodiazepine use 11/02/2015  . Narcotic dependency, continuous ([place]) 11/02/2015  . HTN (hypertension), malignant 11/01/2015  . Uncontrolled type 2 diabetes mellitus with diabetic nephropathy, with long-term current use of insulin ([place]) 08/12/2015  . Benign essential HTN 08/12/2015  . Dyslipidemia associated with type 2 diabetes mellitus ([place]) 08/12/2015  . Depression 08/12/2015  . CKD (chronic kidney disease) stage 3, GFR 30-59 ml/min 03/20/2014    Past Surgical History:  Procedure Laterality Date  . HIP ARTHROPLASTY Right 02/05/2013   Procedure: ARTHROPLASTY BIPOLAR HIP;  Surgeon: [name], MD;  Location: [facility];  Service: Orthopedics;  Laterality: Right;  . INTRAMEDULLARY (IM) NAIL INTERTROCHANTERIC Left 06/16/2016   Procedure: INTRAMEDULLARY (IM) NAIL INTERTROCHANTRIC;  Surgeon: Rod Can, MD;  Location: [facility];  Service: Orthopedics;  Laterality: Left;  Home Medications    Prior to Admission medications   Medication Sig Start Date End Date Taking? Authorizing Provider  acetaminophen (TYLENOL) 325 MG tablet Take 2 tablets (650 mg total) by mouth every 6 (six) hours as needed for mild pain (or Fever >/=  101). 08/14/15  Yes Robbie Lis, MD  carvedilol (COREG) 6.25 MG tablet Take 1 tablet (6.25 mg total) by mouth 2 (two) times daily with a meal. 05/12/16  Yes Thurnell Lose, MD  FLUoxetine (PROZAC) 20 MG capsule Take 20 mg by mouth daily.   Yes Historical Provider, MD  insulin aspart (NOVOLOG FLEXPEN) 100 UNIT/ML FlexPen Inject 0-12 Units into the skin 3 (three) times daily with meals. Per sliding scale, 0-150 = 0 units, 151-200 = 2 units, 201-250 = 4 units, 251-300 = 6 units, 301-350 = 8 units, 351-400 = 10 units, 401-500 = 12 units (call MD) Patient taking differently: Inject 6-20 Units into the skin 3 (three) times daily as needed for high blood sugar. Per sliding scale 08/19/16  Yes Costin Karlyne Greenspan, MD  ramipril (ALTACE) 10 MG capsule Take 10 mg by mouth daily.   Yes Historical Provider, MD  rivaroxaban (XARELTO) 20 MG TABS tablet Take 1 tablet (20 mg total) by mouth daily with supper. STARTING October 16. 07/28/16  Yes Bonnielee Haff, MD  thiamine 100 MG tablet Take 1 tablet (100 mg total) by mouth daily. 11/03/15  Yes Simbiso Ranga, MD  amLODipine (NORVASC) 10 MG tablet Take 1 tablet (10 mg total) by mouth daily. Patient not taking: Reported on 11/16/2016 08/14/15   Robbie Lis, MD  atorvastatin (LIPITOR) 10 MG tablet Take 1 tablet (10 mg total) by mouth daily at 6 PM. Patient not taking: Reported on 11/16/2016 03/23/15   [name], DO  cyanocobalamin (,VITAMIN B-12,) 1000 MCG/ML injection Inject 1 mL (1,000 mcg total) into the muscle every 30 (thirty) days. Patient not taking: Reported on 11/16/2016 06/20/16   Venetia Maxon Rama, MD  HYDROcodone-acetaminophen (NORCO/VICODIN) 5-325 MG tablet Take 1 tablet by mouth every 4 (four) hours as needed for severe pain. Patient not taking: Reported on 11/16/2016 08/19/16   Caren Griffins, MD  insulin glargine (LANTUS) 100 UNIT/ML injection Inject 0.1 mLs (10 Units total) into the skin daily. Patient not taking: Reported on 11/16/2016 08/26/16   [place], [name]  senna (SENOKOT) 8.6 MG TABS tablet Take 1 tablet (8.6 mg total) by mouth 2 (two) times daily. Patient not taking: Reported on 11/16/2016 06/20/16   Venetia Maxon Rama, MD    Family History Family History  Problem Relation Age of Onset  . Diabetes Mother   . Alzheimer's disease Mother   . Hypertension Mother   . Hyperlipidemia Father   . Hypertension Father   . Healthy Maternal Grandmother   . Pneumonia Maternal Grandfather     Social History Social History  Substance Use Topics  . Smoking status: Former Smoker    Packs/day: 0.25    Years: 30.00    Types: Cigarettes    Quit date: 01/31/2013  . Smokeless tobacco: Never Used  . Alcohol use No     Allergies   Patient has no known allergies.   Review of Systems Review of Systems  Constitutional: Negative for fever.  HENT: Negative for sore throat.   Eyes: Negative for visual disturbance.  Respiratory: Negative for cough and shortness of breath.   Cardiovascular: Negative for chest pain.  Gastrointestinal: Negative for abdominal pain and vomiting.  Endocrine:  Negative for polydipsia and polyuria.  Genitourinary: Negative for flank pain.  Musculoskeletal: Negative for back pain and neck pain.  Skin: Negative for rash.  Neurological: Positive for weakness. Negative for headaches.  Hematological: Does not bruise/bleed easily.  Psychiatric/Behavioral: Negative for confusion.     Physical Exam Updated Vital Signs Temp 97.4 F (36.3 C) (Axillary)   Ht 5\' 9"  (1.753 m)   Wt 68 kg   SpO2 98%   BMI 22.15 kg/m   Physical Exam  Constitutional: He appears well-developed and well-nourished. No distress.  HENT:  Head: Atraumatic.  Mouth/Throat: Oropharynx is clear and moist.  Eyes: Conjunctivae and EOM are normal. Pupils are equal, round, and reactive to light.  Neck: Neck supple. No tracheal deviation present.  Cardiovascular: Normal rate, regular rhythm, normal heart sounds and intact distal pulses.  Exam  reveals no gallop and no friction rub.   No murmur heard. Pulmonary/Chest: Effort normal and breath sounds normal. No accessory muscle usage. No respiratory distress.  Abdominal: Soft. Bowel sounds are normal. He exhibits no distension. There is no tenderness.  Genitourinary:  Genitourinary Comments: No cva tenderness  Musculoskeletal: He exhibits no edema.  Neurological: He is alert.  Motor intact bil, stre 5/5. sens grossly intact.   Skin: Skin is warm and dry. He is not diaphoretic.  Psychiatric: He has a normal mood and affect.  Nursing note and vitals reviewed.    ED Treatments / Results  Labs (all labs ordered are listed, but only abnormal results are displayed) Labs Reviewed  BASIC METABOLIC PANEL - Abnormal; Notable for the following:       Result Value   Sodium 130 (*)    Potassium 5.2 (*)    Chloride 99 (*)    CO2 7 (*)    Glucose, Bld 1,158 (*)    BUN 71 (*)    Creatinine, Ser 3.56 (*)    Calcium 7.8 (*)    GFR calc non Af Amer 16 (*)    GFR calc Af Amer 18 (*)    Anion gap 24 (*)    All other components within normal limits  CBC - Abnormal; Notable for the following:    RBC 3.48 (*)    Hemoglobin 10.6 (*)    HCT 36.8 (*)    MCV 105.7 (*)    MCHC 28.8 (*)    RDW 17.5 (*)    Platelets 148 (*)    All other components within normal limits  CBG MONITORING, ED - Abnormal; Notable for the following:    Glucose-Capillary >600 (*)    All other components within normal limits  URINALYSIS, ROUTINE W REFLEX MICROSCOPIC    EKG  EKG Interpretation  Date/Time:  Sunday November 16 2016 09:33:19 EST Ventricular Rate:  79 PR Interval:    QRS Duration: 90 QT Interval:  400 QTC Calculation: 462 R Axis:   -38 Text Interpretation:  Sinus rhythm Atrial premature complex Left axis deviation Baseline wander Confirmed by Ashok Cordia  MD, Lennette Bihari ([id number]) on 11/16/2016 9:56:07 AM       Radiology Dg Chest Port 1 View  Result Date: 11/16/2016 CLINICAL DATA:  Generalized weakness  and rapid respiratory rate. EXAM: PORTABLE CHEST 1 VIEW COMPARISON:  10/02/2016 FINDINGS: The cardiac silhouette, mediastinal and hilar contours are within normal limits and stable. There is mild tortuosity of the thoracic aorta. The lungs are clear. No pleural effusion. The bony thorax is intact. IMPRESSION: No acute cardiopulmonary findings. Electronically Signed   By: Ricky Stabs.D.  On: 11/16/2016 10:47    Procedures Procedures (including critical care time)  Medications Ordered in ED Medications  sodium chloride 0.9 % bolus 1,000 mL (not administered)     Initial Impression / Assessment and Plan / ED Course  I have reviewed the triage vital signs and the nursing notes.  Pertinent labs & imaging results that were available during my care of the patient were reviewed by me and considered in my medical decision making (see chart for details).  Iv ns bolus. Labs.  Continuous pulse ox and cardiac monitoring. Ecg.   Reviewed nursing notes and prior charts for additional history.   Additional ns bolus.   Iv insulin gtt via glucostabilizer.  Recheck pt, feels mildly improved.  Medical service consulted for admission.  Glucose severely elevated, hco3 low, 7.  CRITICAL CARE  RE Diabetic Ketoacidosis w severe hyperglycemia, iv insulin gtt, dehydration.  Performed by: Mirna Mires Total critical care time: 40 minutes Critical care time was exclusive of separately billable procedures and treating other patients. Critical care was necessary to treat or prevent imminent or life-threatening deterioration. Critical care was time spent personally by me on the following activities: development of treatment plan with patient and/or surrogate as well as nursing, discussions with consultants, evaluation of patient's response to treatment, examination of patient, obtaining history from patient or surrogate, ordering and performing treatments and interventions, ordering and review of  laboratory studies, ordering and review of radiographic studies, pulse oximetry and re-evaluation of patient's condition.   Final Clinical Impressions(s) / ED Diagnoses   Final diagnoses:  None    New Prescriptions New Prescriptions   No medications on file     Lajean Saver, MD 11/16/16 1102

## 2016-11-16 NOTE — H&P (Signed)
History and Physical    [name] [name] PPJ:[id number] DOB: 10-07-1946 DOA: 11/16/2016  PCP: Mauricio Po, [name] Patient coming from: assisted living  Chief Complaint: hyperglycemia  HPI: [name] [name] is a 71 y.o. male with medical history significant for chronic kidney disease stage III diabetes uncontrolled due to noncompliance, hypertension, CVA since emergency Department chief complaint generalized weakness. Initial evaluation reveals DKA with anion gap 24 and bicarb 7.  Information is obtained from the patient and the daughter who is at the bedside. Daughter reports patient has a long history of noncompliance. She states he was diagnosed with diabetes about 10 years ago and he has never been compliant with his regimen. She states he's had admissions for DKA in the past where his serum glucose was greater than 1600. Reports she went to visit him today and noted him to be somewhat short of breath somewhat lethargic and generally weak. She states over the last several weeks she's noted a slow decline in his oral intake. She states his noncompliance with medications as long-standing. Patient reports some nausea and vomiting yesterday. He denies headache dizziness syncope or near-syncope. He denies chest pain palpitations cough fever chills lower extremity edema. As abdominal pain dysuria hematuria frequency or urgency.    ED Course: In the emergency department he's afebrile hemodynamically stable with tachypnea. He is not hypoxic. He is provided with 2 L bolus of normal saline and vigorous NS after, insulin gtts.  Review of Systems: As per HPI otherwise 10 point review of systems negative.   Ambulatory Status: Ambulates with a cane. Daughter reports not so steady gait. She is unaware of any recent falls. Patient denies any recent falls  Past Medical History:  Diagnosis Date  . 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 Closed comminuted  intertrochanteric fracture of left femur ([place])   . Diabetes mellitus without complication ([place])   . Diabetic hyperosmolar non-ketotic state ([place]) 05/15/2016  . DKA (diabetic ketoacidoses) ([place]) 05/09/2016  . Hypertension   . Postoperative anemia due to acute blood loss 06/18/2016  . Retroperitoneal hematoma 06/18/2016  . Vitamin B 12 deficiency 06/18/2016    Past Surgical History:  Procedure Laterality Date  . HIP ARTHROPLASTY Right 02/05/2013   Procedure: ARTHROPLASTY BIPOLAR HIP;  Surgeon: [name], MD;  Location: [facility];  Service: Orthopedics;  Laterality: Right;  . INTRAMEDULLARY (IM) NAIL INTERTROCHANTERIC Left 06/16/2016   Procedure: INTRAMEDULLARY (IM) NAIL INTERTROCHANTRIC;  Surgeon: Rod Can, MD;  Location: [facility];  Service: Orthopedics;  Laterality: Left;    Social History   Social History  . Marital status: Single    Spouse name: N/A  . Number of children: 2  . Years of education: 13   Occupational History  . Retired    Social History Main Topics  . Smoking status: Former Smoker    Packs/day: 0.25    Years: 30.00    Types: Cigarettes    Quit date: 01/31/2013  . Smokeless tobacco: Never Used  . Alcohol use No  . Drug use: No  . Sexual activity: No   Other Topics Concern  . Not on file   Social History Narrative   Fun: Likes to do handyman related projects, photography.    Denies religious beliefs effecting health care.     No Known Allergies  Family History  Problem Relation Age of Onset  . Diabetes Mother   . Alzheimer's disease Mother   . Hypertension Mother   . Hyperlipidemia Father   .  Hypertension Father   . Healthy Maternal Grandmother   . Pneumonia Maternal Grandfather     Prior to Admission medications   Medication Sig Start Date End Date Taking? Authorizing Provider  acetaminophen (TYLENOL) 325 MG tablet Take 2 tablets (650 mg total) by mouth every 6 (six) hours as needed for mild pain (or Fever >/= 101). 08/14/15  Yes Robbie Lis, MD   carvedilol (COREG) 6.25 MG tablet Take 1 tablet (6.25 mg total) by mouth 2 (two) times daily with a meal. 05/12/16  Yes Thurnell Lose, MD  FLUoxetine (PROZAC) 20 MG capsule Take 20 mg by mouth daily.   Yes Historical Provider, MD  insulin aspart (NOVOLOG FLEXPEN) 100 UNIT/ML FlexPen Inject 0-12 Units into the skin 3 (three) times daily with meals. Per sliding scale, 0-150 = 0 units, 151-200 = 2 units, 201-250 = 4 units, 251-300 = 6 units, 301-350 = 8 units, 351-400 = 10 units, 401-500 = 12 units (call MD) Patient taking differently: Inject 6-20 Units into the skin 3 (three) times daily as needed for high blood sugar. Per sliding scale 08/19/16  Yes Costin Karlyne Greenspan, MD  ramipril (ALTACE) 10 MG capsule Take 10 mg by mouth daily.   Yes Historical Provider, MD  rivaroxaban (XARELTO) 20 MG TABS tablet Take 1 tablet (20 mg total) by mouth daily with supper. STARTING October 16. 07/28/16  Yes Bonnielee Haff, MD  thiamine 100 MG tablet Take 1 tablet (100 mg total) by mouth daily. 11/03/15  Yes Simbiso Ranga, MD  amLODipine (NORVASC) 10 MG tablet Take 1 tablet (10 mg total) by mouth daily. Patient not taking: Reported on 11/16/2016 08/14/15   Robbie Lis, MD  cyanocobalamin (,VITAMIN B-12,) 1000 MCG/ML injection Inject 1 mL (1,000 mcg total) into the muscle every 30 (thirty) days. Patient not taking: Reported on 11/16/2016 06/20/16   Venetia Maxon Rama, MD  HYDROcodone-acetaminophen (NORCO/VICODIN) 5-325 MG tablet Take 1 tablet by mouth every 4 (four) hours as needed for severe pain. Patient not taking: Reported on 11/16/2016 08/19/16   Caren Griffins, MD  insulin glargine (LANTUS) 100 UNIT/ML injection Inject 0.1 mLs (10 Units total) into the skin daily. Patient not taking: Reported on 11/16/2016 08/26/16   Golden Circle, [name]  senna (SENOKOT) 8.6 MG TABS tablet Take 1 tablet (8.6 mg total) by mouth 2 (two) times daily. Patient not taking: Reported on 11/16/2016 06/20/16   Venetia Maxon Rama, MD    Physical  Exam: Vitals:   11/16/16 0930 11/16/16 0945 11/16/16 1000 11/16/16 1130  BP: 132/73 130/58 127/69 115/64  Pulse: 76 73 68   Resp: 22 (!) 28 26 20   Temp:      TempSrc:      SpO2: 96%  98%   Weight:      Height:         General:  Appears calm, Lightly lethargic but not uncomfortable requesting food and water Eyes:  PERRL, EOMI, normal lids, iris ENT:  grossly normal hearing, lips & tongue, his membranes of his mouth are dry but pink, very poor dentition Neck:  no LAD, masses or thyromegaly Cardiovascular:  RRR, no m/r/g. No LE edema. Pedal pulses present and palpable Respiratory:  CTA bilaterally, no w/r/r. Normal respiratory effort. Abdomen:  soft, ntnd, very sluggish bowel sounds no guarding or rebounding Skin:  no rash or induration seen on limited exam Musculoskeletal:  grossly normal tone BUE/BLE, good ROM, no bony abnormality joints without swelling/erythema Psychiatric:  grossly normal mood and affect, speech  fluent and appropriate, AOx3 Neurologic:  CN 2-12 grossly intact, moves all extremities in coordinated fashion, sensation intact. attempts to follow commands speech slow but clear moving all extremities  Labs on Admission: I have personally reviewed following labs and imaging studies  CBC:  Recent Labs Lab 11/16/16 0931  WBC 5.8  HGB 10.6*  HCT 36.8*  MCV 105.7*  PLT 716*   Basic Metabolic Panel:  Recent Labs Lab 11/16/16 0931  NA 130*  K 5.2*  CL 99*  CO2 7*  GLUCOSE 1,158*  BUN 71*  CREATININE 3.56*  CALCIUM 7.8*   GFR: Estimated Creatinine Clearance: 18.3 mL/min (by C-G formula based on SCr of 3.56 mg/dL (H)). Liver Function Tests: No results for input(s): AST, ALT, ALKPHOS, BILITOT, PROT, ALBUMIN in the last 168 hours. No results for input(s): LIPASE, AMYLASE in the last 168 hours. No results for input(s): AMMONIA in the last 168 hours. Coagulation Profile: No results for input(s): INR, PROTIME in the last 168 hours. Cardiac Enzymes: No  results for input(s): CKTOTAL, CKMB, CKMBINDEX, TROPONINI in the last 168 hours. BNP (last 3 results) No results for input(s): PROBNP in the last 8760 hours. HbA1C: No results for input(s): HGBA1C in the last 72 hours. CBG:  Recent Labs Lab 11/16/16 1003  GLUCAP >600*   Lipid Profile: No results for input(s): CHOL, HDL, LDLCALC, TRIG, CHOLHDL, LDLDIRECT in the last 72 hours. Thyroid Function Tests: No results for input(s): TSH, T4TOTAL, FREET4, T3FREE, THYROIDAB in the last 72 hours. Anemia Panel: No results for input(s): VITAMINB12, FOLATE, FERRITIN, TIBC, IRON, RETICCTPCT in the last 72 hours. Urine analysis:    Component Value Date/Time   COLORURINE STRAW (A) 10/03/2016 1014   APPEARANCEUR CLEAR 10/03/2016 1014   LABSPEC 1.006 10/03/2016 1014   PHURINE 5.0 10/03/2016 1014   GLUCOSEU 50 (A) 10/03/2016 1014   HGBUR LARGE (A) 10/03/2016 1014   BILIRUBINUR NEGATIVE 10/03/2016 1014   KETONESUR NEGATIVE 10/03/2016 1014   PROTEINUR 30 (A) 10/03/2016 1014   UROBILINOGEN 0.2 08/21/2015 2154   NITRITE NEGATIVE 10/03/2016 1014   LEUKOCYTESUR MODERATE (A) 10/03/2016 1014    Creatinine Clearance: Estimated Creatinine Clearance: 18.3 mL/min (by C-G formula based on SCr of 3.56 mg/dL (H)).  Sepsis Labs: @LABRCNTIP (procalcitonin:4,lacticidven:4) )No results found for this or any previous visit (from the past 240 hour(s)).   Radiological Exams on Admission: Dg Chest Port 1 View  Result Date: 11/16/2016 CLINICAL DATA:  Generalized weakness and rapid respiratory rate. EXAM: PORTABLE CHEST 1 VIEW COMPARISON:  10/02/2016 FINDINGS: The cardiac silhouette, mediastinal and hilar contours are within normal limits and stable. There is mild tortuosity of the thoracic aorta. The lungs are clear. No pleural effusion. The bony thorax is intact. IMPRESSION: No acute cardiopulmonary findings. Electronically Signed   By: Marijo Sanes M.D.   On: 11/16/2016 10:47    EKG: Independently reviewed. Sinus  rhythm Atrial premature complex Left axis deviation Baseline wander  Assessment/Plan Principal Problem:   DKA (diabetic ketoacidoses) ([name]) Active Problems:   Benign essential HTN   Anxiety   History of CVA (cerebrovascular accident)   Noncompliance with medication regimen   Acute renal failure superimposed on stage 3 chronic kidney disease ([name])   Generalized weakness   #1. DKA likely related to noncompliance. Serum glucose thousand 1158. An ion gap 24. CO2 7. Chart review indicates history of noncompliance the patient also with brittle diabetes and has had several hypoglycemic episodes. No signs symptoms of infection. He is provided with fluid resuscitation and insulin drip in  the emergency department -Admit to step down -Continue insulin drip per protocol -Bemet every 4 hours -Normal saline and 125 an hour until CBGs less than 2504 -Transition to D5 saline when indicated per protocol -Home regimen includes 10 of Lantus and sliding scale -Nothing by mouth -follow ABG -transition to home regimen whenindicated  #2. Acute renal failure superimposed on stage III chronic kidney disease. Clearly related to above and decreased oral intake. Creatinine 3.56 on admission. Chart review indicates baseline range 2-3. -IV fluids as noted above -Hold nephrotoxins -Monitor urine output -Recheck in the morning  #3. Hypertension. Fair control in the emergency department. Home medications include amlodipine, Coreg, ramapril -Continue correlate for now -Hold amlodipine and ACE inhibitor -Monitor blood pressure -Resume  #4. History of CVA. -continue home meds  #5. Generalized weakness. Likely related to above. However daughter indicates he has had 8 gradual decline over the last several weeks. Currently lives in assisted living. -PT consult  #6. Noncompliance. Daughter reports a long history of noncompliance. Chart review indicates occult controlled diabetes falls malnutrition. Chart review  also indicates that [facility] at [facility] has "dismissed" patient for 3 or more no-shows. Daughter confirms patient's refusal to follow-up with physician -Care management for her new PCP    DVT prophylaxis: xarelto Code Status: full  Family Communication: daughter at bedside  Disposition Plan: assisted living may benefit snf  Consults called: none  Admission status: inpatient    Radene Gunning MD [facility]  If 7PM-7AM, please contact night-coverage [email or web address] Password [email or web address]  11/16/2016, 11:53 AM

## 2016-11-17 ENCOUNTER — Telehealth: Payer: Self-pay | Admitting: Family

## 2016-11-17 DIAGNOSIS — K625 Hemorrhage of anus and rectum: Secondary | ICD-10-CM

## 2016-11-17 DIAGNOSIS — R7989 Other specified abnormal findings of blood chemistry: Secondary | ICD-10-CM

## 2016-11-17 DIAGNOSIS — Z9114 Patient's other noncompliance with medication regimen: Secondary | ICD-10-CM

## 2016-11-17 DIAGNOSIS — F419 Anxiety disorder, unspecified: Secondary | ICD-10-CM

## 2016-11-17 DIAGNOSIS — Z8673 Personal history of transient ischemic attack (TIA), and cerebral infarction without residual deficits: Secondary | ICD-10-CM

## 2016-11-17 LAB — URINALYSIS, ROUTINE W REFLEX MICROSCOPIC
Bilirubin Urine: NEGATIVE
Glucose, UA: >=500 mg/dL — AB
Ketones, ur: NEGATIVE mg/dL
Leukocytes, UA: NEGATIVE
Nitrite: NEGATIVE
Protein, ur: 30 mg/dL — AB
Specific Gravity, Urine: 1.009 (ref 1.005–1.030)
pH: 5 (ref 5.0–8.0)

## 2016-11-17 LAB — CBC WITH DIFFERENTIAL/PLATELET
Basophils Absolute: 0 10*3/uL (ref 0.0–0.1)
Basophils Relative: 0 %
Eosinophils Absolute: 0.1 10*3/uL (ref 0.0–0.7)
Eosinophils Relative: 2 %
HCT: 26.9 % — ABNORMAL LOW (ref 39.0–52.0)
Hemoglobin: 9.2 g/dL — ABNORMAL LOW (ref 13.0–17.0)
Lymphocytes Relative: 22 %
Lymphs Abs: 1.9 10*3/uL (ref 0.7–4.0)
MCH: 30.6 pg (ref 26.0–34.0)
MCHC: 34.2 g/dL (ref 30.0–36.0)
MCV: 89.4 fL (ref 78.0–100.0)
Monocytes Absolute: 0.6 10*3/uL (ref 0.1–1.0)
Monocytes Relative: 7 %
Neutro Abs: 5.9 10*3/uL (ref 1.7–7.7)
Neutrophils Relative %: 69 %
Platelets: 145 10*3/uL — ABNORMAL LOW (ref 150–400)
RBC: 3.01 MIL/uL — ABNORMAL LOW (ref 4.22–5.81)
RDW: 15.2 % (ref 11.5–15.5)
WBC: 8.6 10*3/uL (ref 4.0–10.5)

## 2016-11-17 LAB — COMPREHENSIVE METABOLIC PANEL
ALT: 103 U/L — ABNORMAL HIGH (ref 17–63)
AST: 164 U/L — ABNORMAL HIGH (ref 15–41)
Albumin: 2.2 g/dL — ABNORMAL LOW (ref 3.5–5.0)
Alkaline Phosphatase: 188 U/L — ABNORMAL HIGH (ref 38–126)
Anion gap: 16 — ABNORMAL HIGH (ref 5–15)
BUN: 58 mg/dL — ABNORMAL HIGH (ref 6–20)
CO2: 11 mmol/L — ABNORMAL LOW (ref 22–32)
Calcium: 7.1 mg/dL — ABNORMAL LOW (ref 8.9–10.3)
Chloride: 112 mmol/L — ABNORMAL HIGH (ref 101–111)
Creatinine, Ser: 2.88 mg/dL — ABNORMAL HIGH (ref 0.61–1.24)
GFR calc Af Amer: 24 mL/min — ABNORMAL LOW (ref >60)
GFR calc non Af Amer: 21 mL/min — ABNORMAL LOW (ref >60)
Glucose, Bld: 147 mg/dL — ABNORMAL HIGH (ref 65–99)
Potassium: 4 mmol/L (ref 3.5–5.1)
Sodium: 139 mmol/L (ref 135–145)
Total Bilirubin: 0.7 mg/dL (ref 0.3–1.2)
Total Protein: 4.4 g/dL — ABNORMAL LOW (ref 6.5–8.1)

## 2016-11-17 LAB — BASIC METABOLIC PANEL
Anion gap: 16 — ABNORMAL HIGH (ref 5–15)
Anion gap: 10 (ref 5–15)
BUN: 51 mg/dL — ABNORMAL HIGH (ref 6–20)
BUN: 48 mg/dL — ABNORMAL HIGH (ref 6–20)
CO2: 14 mmol/L — ABNORMAL LOW (ref 22–32)
CO2: 16 mmol/L — ABNORMAL LOW (ref 22–32)
Calcium: 7.3 mg/dL — ABNORMAL LOW (ref 8.9–10.3)
Calcium: 6.7 mg/dL — ABNORMAL LOW (ref 8.9–10.3)
Chloride: 111 mmol/L (ref 101–111)
Chloride: 107 mmol/L (ref 101–111)
Creatinine, Ser: 2.68 mg/dL — ABNORMAL HIGH (ref 0.61–1.24)
Creatinine, Ser: 2.45 mg/dL — ABNORMAL HIGH (ref 0.61–1.24)
GFR calc Af Amer: 26 mL/min — ABNORMAL LOW (ref >60)
GFR calc Af Amer: 29 mL/min — ABNORMAL LOW (ref >60)
GFR calc non Af Amer: 22 mL/min — ABNORMAL LOW (ref >60)
GFR calc non Af Amer: 25 mL/min — ABNORMAL LOW (ref >60)
Glucose, Bld: 146 mg/dL — ABNORMAL HIGH (ref 65–99)
Glucose, Bld: 103 mg/dL — ABNORMAL HIGH (ref 65–99)
Potassium: 3.9 mmol/L (ref 3.5–5.1)
Potassium: 3.1 mmol/L — ABNORMAL LOW (ref 3.5–5.1)
Sodium: 141 mmol/L (ref 135–145)
Sodium: 133 mmol/L — ABNORMAL LOW (ref 135–145)

## 2016-11-17 LAB — GLUCOSE, CAPILLARY
Glucose-Capillary: 104 mg/dL — ABNORMAL HIGH (ref 65–99)
Glucose-Capillary: 119 mg/dL — ABNORMAL HIGH (ref 65–99)
Glucose-Capillary: 120 mg/dL — ABNORMAL HIGH (ref 65–99)
Glucose-Capillary: 126 mg/dL — ABNORMAL HIGH (ref 65–99)
Glucose-Capillary: 128 mg/dL — ABNORMAL HIGH (ref 65–99)
Glucose-Capillary: 138 mg/dL — ABNORMAL HIGH (ref 65–99)
Glucose-Capillary: 138 mg/dL — ABNORMAL HIGH (ref 65–99)
Glucose-Capillary: 140 mg/dL — ABNORMAL HIGH (ref 65–99)
Glucose-Capillary: 144 mg/dL — ABNORMAL HIGH (ref 65–99)
Glucose-Capillary: 145 mg/dL — ABNORMAL HIGH (ref 65–99)
Glucose-Capillary: 150 mg/dL — ABNORMAL HIGH (ref 65–99)
Glucose-Capillary: 164 mg/dL — ABNORMAL HIGH (ref 65–99)
Glucose-Capillary: 167 mg/dL — ABNORMAL HIGH (ref 65–99)
Glucose-Capillary: 168 mg/dL — ABNORMAL HIGH (ref 65–99)
Glucose-Capillary: 170 mg/dL — ABNORMAL HIGH (ref 65–99)
Glucose-Capillary: 173 mg/dL — ABNORMAL HIGH (ref 65–99)
Glucose-Capillary: 191 mg/dL — ABNORMAL HIGH (ref 65–99)
Glucose-Capillary: 199 mg/dL — ABNORMAL HIGH (ref 65–99)
Glucose-Capillary: 208 mg/dL — ABNORMAL HIGH (ref 65–99)
Glucose-Capillary: 269 mg/dL — ABNORMAL HIGH (ref 65–99)
Glucose-Capillary: 78 mg/dL (ref 65–99)
Glucose-Capillary: 87 mg/dL (ref 65–99)

## 2016-11-17 LAB — TYPE AND SCREEN
ABO/RH(D): B POS
Antibody Screen: NEGATIVE

## 2016-11-17 LAB — HEMOGLOBIN AND HEMATOCRIT, BLOOD
HCT: 23 % — ABNORMAL LOW (ref 39.0–52.0)
Hemoglobin: 7.9 g/dL — ABNORMAL LOW (ref 13.0–17.0)

## 2016-11-17 IMAGING — US US RENAL
1 series · 14 of 25 positions shown · non-contrast
Comparison: None.

CLINICAL DATA: Acute renal injury

EXAM:
RENAL / URINARY TRACT ULTRASOUND COMPLETE

[Series 1: us renal · 0.24mm/px · 14 of 42 slices shown]
[im 1/42]
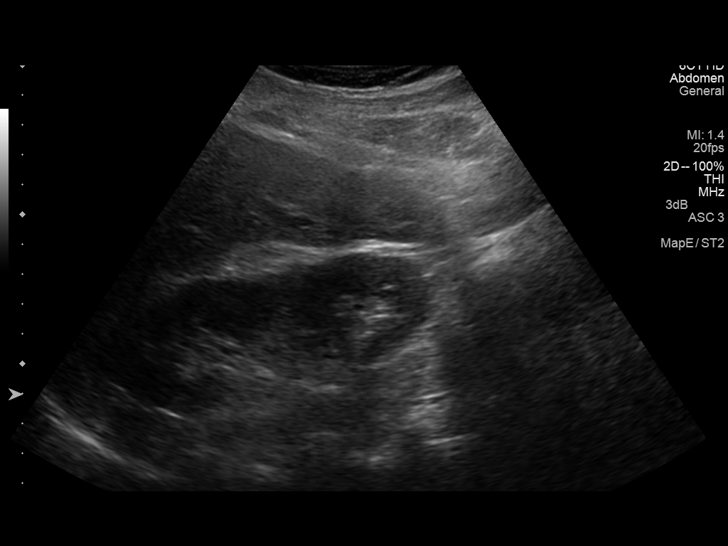
[im 4/42]
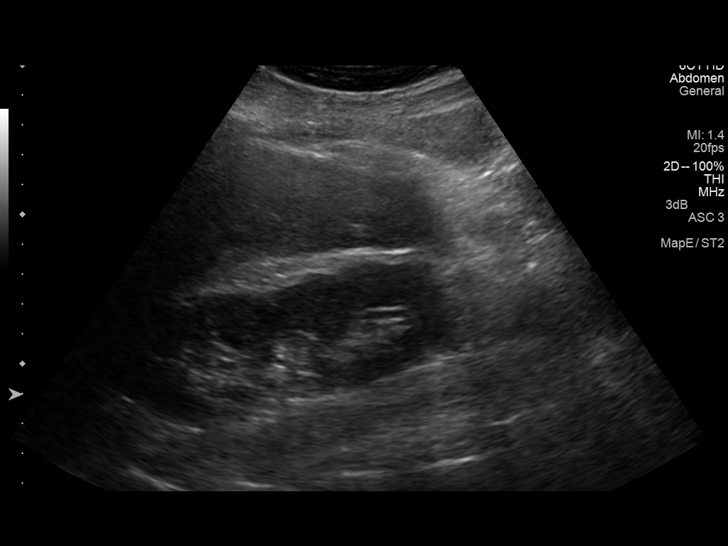
[im 7/42]
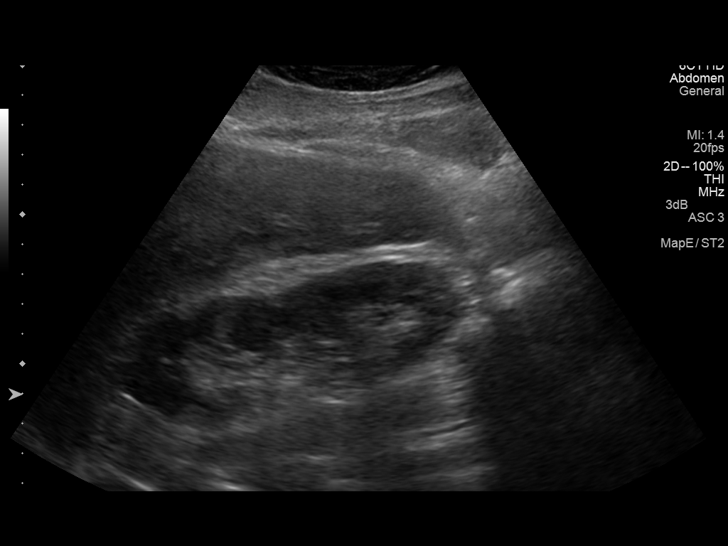
[im 11/42]
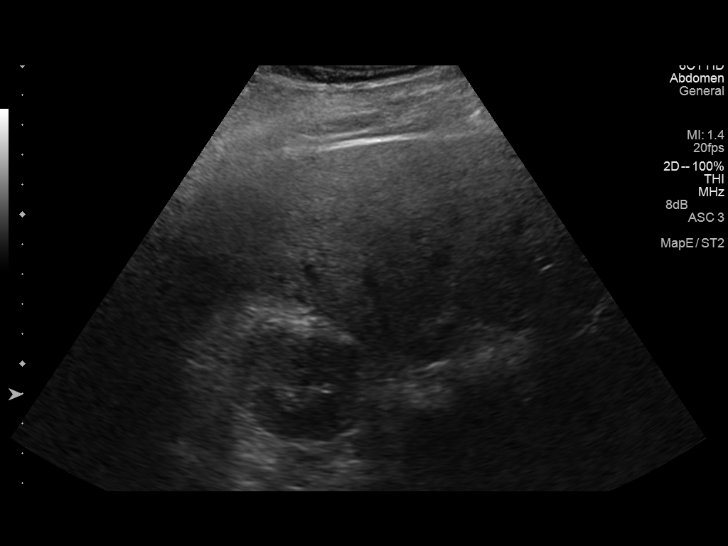
[im 14/42]
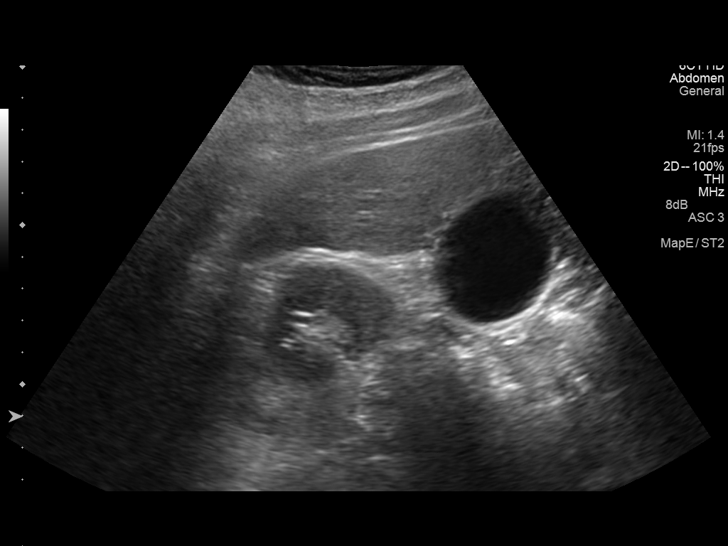
[im 16/42]
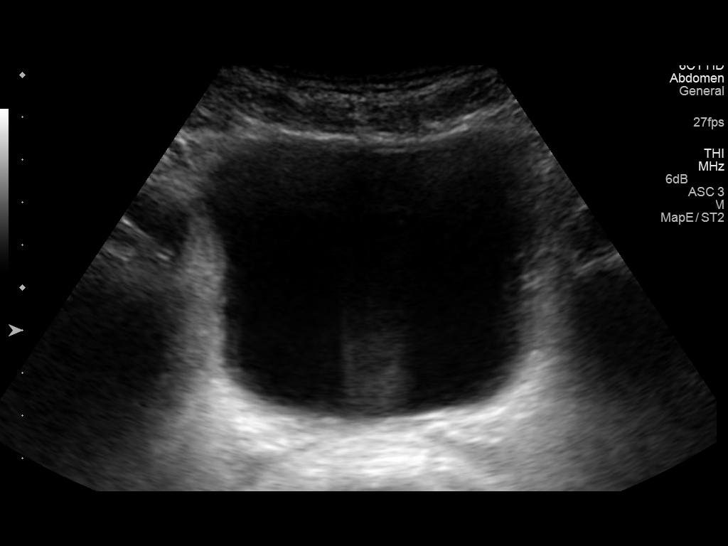
[im 19/42]
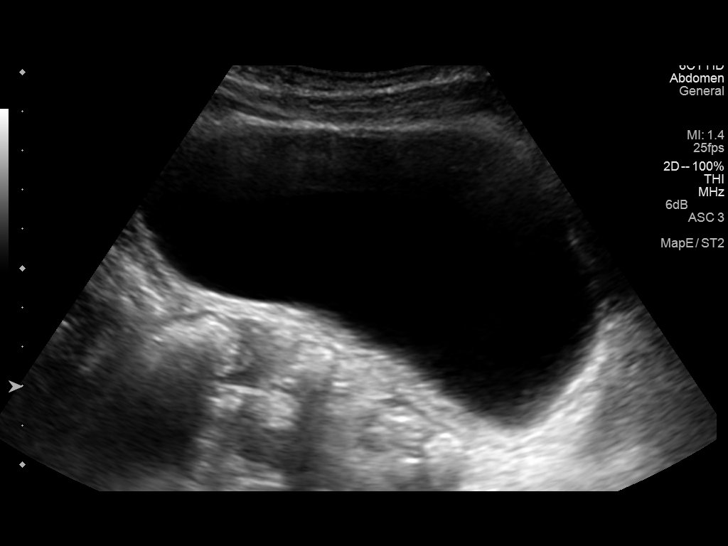
[im 23/42]
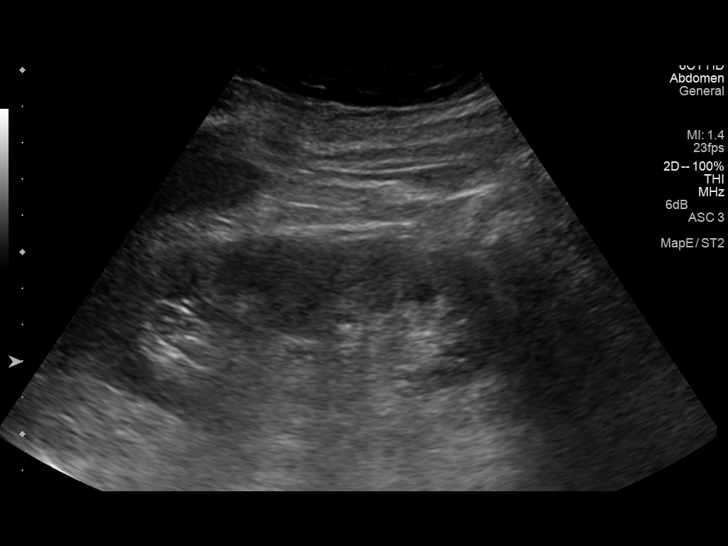
[im 26/42]
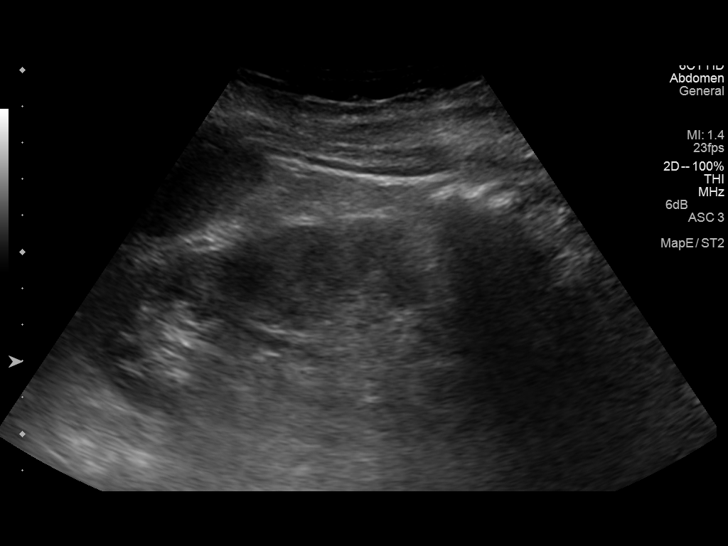
[im 28/42]
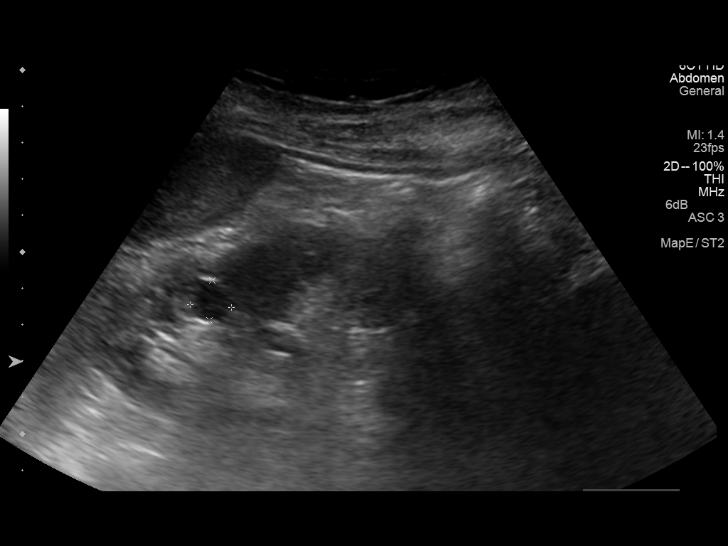
[im 31/42]
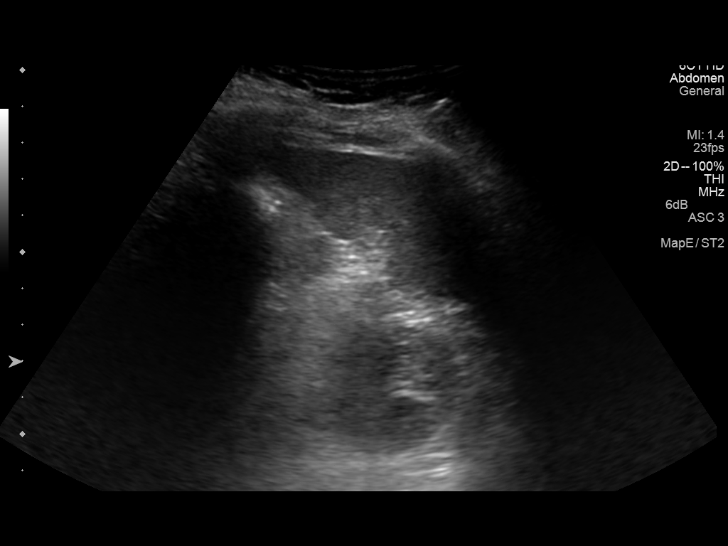
[im 35/42]
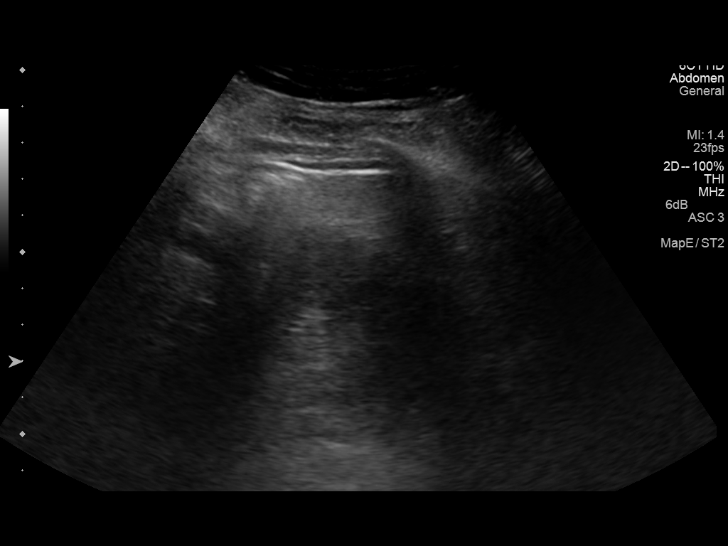
[im 38/42]
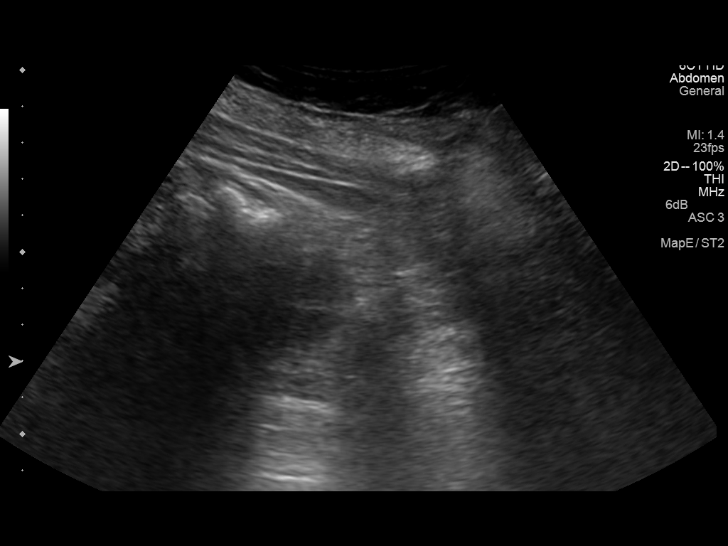
[im 42/42]
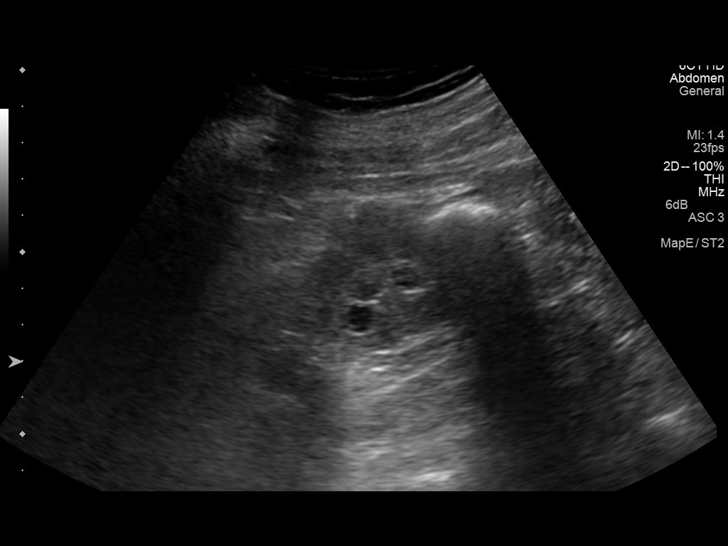

[14 of 25 positions shown; findings below may reference images not displayed]

FINDINGS: Right Kidney:

Length: 11.4 cm.. Echogenicity within normal limits. No mass or
hydronephrosis visualized.

Left Kidney:

Length: 11.7 Cm. A few small cysts are noted. The largest of these
measures 9 mm in greatest dimension. No mass lesion or
hydronephrosis is noted.

Bladder:

Appears normal for degree of bladder distention.
IMPRESSION: Left renal cysts.  No acute abnormality noted.

## 2016-11-17 MED ORDER — SODIUM CHLORIDE 0.9 % IV SOLN
30.0000 meq | Freq: Once | INTRAVENOUS | Status: AC
  Administered 2016-11-18: 30 meq via INTRAVENOUS
  Filled 2016-11-17: qty 15

## 2016-11-17 MED ORDER — SODIUM CHLORIDE 0.9 % IV BOLUS (SEPSIS)
1000.0000 mL | Freq: Once | INTRAVENOUS | Status: AC
  Administered 2016-11-17: 1000 mL via INTRAVENOUS

## 2016-11-17 NOTE — Evaluation (Signed)
Physical Therapy Evaluation Patient Details Name: [name] [name] MRN: [id number] DOB: [birth year] Today's Date: 11/17/2016   History of Present Illness  Pt adm with weakness and DKA. PMH - DM, CVA, CKD, femur fx, htn  Clinical Impression  Pt admitted with above diagnosis and presents to PT with functional limitations due to deficits listed below (See PT problem list). Pt needs skilled PT to maximize independence and safety to allow discharge to SNF. Unsure of pt's home situation. Chart reports pt from ALF. Notes from previous visit had pt refusing SNF after that admission and returning home with family. Pt poor historian and unable to clarify. Currently pt requires assist for safe mobility. Will need to dc somewhere that assist is available.     Follow Up Recommendations SNF;Supervision/Assistance - 24 hour    Equipment Recommendations  None recommended by PT    Recommendations for Other Services       Precautions / Restrictions Precautions Precautions: Fall Restrictions Weight Bearing Restrictions: No      Mobility  Bed Mobility Overal bed mobility: Needs Assistance Bed Mobility: Sidelying to Sit   Sidelying to sit: Mod assist       General bed mobility comments: Assist to elevate trunk into sitting  Transfers Overall transfer level: Needs assistance Equipment used: Rolling walker (2 wheeled) Transfers: Sit to/from Stand Sit to Stand: Min assist         General transfer comment: Assist to bring hips up and for balance  Ambulation/Gait Ambulation/Gait assistance: Min assist Ambulation Distance (Feet): 30 Feet Assistive device: Rolling walker (2 wheeled) Gait Pattern/deviations: Step-through pattern;Decreased step length - right;Decreased step length - left;Shuffle;Trunk flexed Gait velocity: decr Gait velocity interpretation: Below normal speed for age/gender General Gait Details: assist for balance and support. Verbal cues to stay closer to the walker  Stairs            Wheelchair Mobility    Modified Rankin (Stroke Patients Only)       Balance Overall balance assessment: Needs assistance Sitting-balance support: Bilateral upper extremity supported;Feet supported Sitting balance-Leahy Scale: Poor Sitting balance - Comments: UE support   Standing balance support: Bilateral upper extremity supported Standing balance-Leahy Scale: Poor Standing balance comment: walker and min A for static standing                             Pertinent Vitals/Pain Pain Assessment: No/denies pain    Home Living Family/patient expects to be discharged to:: Assisted living               Home Equipment: Walker - 2 wheels Additional Comments: Unsure if assisted living is accurate    Prior Function           Comments: Pt reports that he amb without assistive device but unsure that is accurate.     Hand Dominance   Dominant Hand: Right    Extremity/Trunk Assessment   Upper Extremity Assessment Upper Extremity Assessment: Generalized weakness    Lower Extremity Assessment Lower Extremity Assessment: Generalized weakness       Communication   Communication: No difficulties  Cognition Arousal/Alertness: Lethargic Behavior During Therapy: Flat affect Overall Cognitive Status: No family/caregiver present to determine baseline cognitive functioning                 General Comments: Pt with recent pain meds which may have made pt lethargic. Pt able to wake up for evaluation but back to sleep when not stimulated.  General Comments      Exercises     Assessment/Plan    PT Assessment Patient needs continued PT services  PT Problem List Decreased strength;Decreased activity tolerance;Decreased balance;Decreased mobility;Decreased knowledge of use of DME          PT Treatment Interventions DME instruction;Gait training;Functional mobility training;Therapeutic activities;Therapeutic exercise;Balance  training;Patient/family education    PT Goals (Current goals can be found in the Care Plan section)  Acute Rehab PT Goals Patient Stated Goal: return home PT Goal Formulation: With patient Time For Goal Achievement: 11/24/16 Potential to Achieve Goals: Good    Frequency Min 3X/week   Barriers to discharge        Co-evaluation               End of Session Equipment Utilized During Treatment: Gait belt Activity Tolerance: Patient tolerated treatment well Patient left: in chair;with call bell/phone within reach;with chair alarm set Nurse Communication: Mobility status         Time: 9977-4142 PT Time Calculation (min) (ACUTE ONLY): 33 min   Charges:   PT Evaluation $PT Eval Moderate Complexity: 1 Procedure PT Treatments $Gait Training: 8-22 mins   PT G CodesShary Decamp [place] 08-Dec-2016, 1:30 PM  [employer] PT [phone]

## 2016-11-17 NOTE — Progress Notes (Signed)
PROGRESS NOTE    [name] [name]  DPO:[id number] DOB: 02/20/1946 DOA: 11/16/2016 PCP: Mauricio Po, [name]   Brief Narrative:  [name] [name] is a 71 y.o. male with medical history significant for chronic kidney disease stage III diabetes uncontrolled due to noncompliance, hypertension, CVA who presented to the ED with a chief complaint generalized weakness. Initial evaluation reveals DKA with anion gap 24 and bicarb 7. Daughter states he's had admissions for DKA in the past where his serum glucose was greater than 1600. Reports she went to visit him yesterday and noted him to be somewhat short of breath somewhat lethargic and generally weak. She states over the last several weeks she's noted a slow decline in his oral intake. She states his noncompliance with medications as long-standing. Patient reports some nausea and vomiting yesterday. He denies headache dizziness syncope or near-syncope. He denies chest pain palpitations cough fever chills lower extremity edema. As abdominal pain dysuria hematuria frequency or urgency. Currently in SDU   Assessment & Plan:   Principal Problem:   DKA (diabetic ketoacidoses) ([place]) Active Problems:   Benign essential HTN   Anxiety   History of CVA (cerebrovascular accident)   Noncompliance with medication regimen   Acute renal failure superimposed on stage 3 chronic kidney disease ([name])   Generalized weakness  #1. DKA likely related to noncompliance. improving -Serum glucose thousand 1158. An ion gap 24. CO2 7. Chart review indicates history of noncompliance the patient also with brittle diabetes and has had several hypoglycemic episodes. No signs symptoms of infection. He is provided with fluid resuscitation and insulin drip in the emergency department -Continue insulin drip per protocol -Repeat BMET this AM showed Gap of 16 and Bicarb of 14; Repeat BMET this Evening -Normal saline and 125 an hour until CBGs less than 2504 -Transition to D5 saline when  indicated per protocol -Home regimen includes 10 of Lantus and sliding scale when able to transition -Nothing by mouth -follow ABG -transition to home regimen when indicated -Repeat ABG   #2. Acute renal failure superimposed on stage III chronic kidney disease.  -Improving with IVF Clearly related to above and decreased oral intake. Creatinine 3.56 on admission. Chart review indicates baseline range 0.7-1.2 -IV fluids as noted above -Hold nephrotoxins -Monitor urine output -Recheck in the morning  #3. Hypertension.  -Fair control in the emergency department. Home medications include amlodipine, Coreg, ramapril -Continue Coreg for now -Hold amlodipine and ACE inhibitor -Monitor blood pressure -Resume  #4. BRBPR/Melena -Positive Hemeoccult -Hb/Hct went from 10.5/32.8 -> 9.2/26.9 -H/H q6h -Type and Screen -GI Consult may be warranted if continues  #5. Abnormal LFT's -In the setting of Dehydration -Recheck CMP -Possible RUQ Ultrasound  #6. History of CVA. -Held Atorvastatin because of Abnormal LFT's  #7. Generalized Weakness.  -Likely related to above. However daughter indicates he has had 8 gradual decline over the last several weeks. Currently lives in assisted living. -PT consult when stable  #8. Noncompliance. -Daughter reports a long history of noncompliance. Chart review also indicates that his PCP at [facility] has "dismissed" patient for 3 or more no-shows. Daughter confirms patient's refusal to follow-up with physician -Care management for her new PCP  DVT prophylaxis: Anticoagulated with Xarelto Code Status: FULL CODE Family Communication: Discussed with Daughter at bedside Disposition Plan: Remain in SDU  Consultants:   None   Procedures: None   Antimicrobials:  Anti-infectives    None     Subjective: Seen and examined and stated he felt a little bit better. Was hungry  and wanted to eat but still in DKA. No other concerns or complaints.  Daughter stats he is very non-compliant.   Objective: Vitals:   11/17/16 0800 11/17/16 1206 11/17/16 1600 11/17/16 1717  BP: (!) 96/56 96/63 (!) 110/58 106/61  Pulse:  (!) 47 (!) 50 (!) 54  Resp: 17 (!) 27 13 16   Temp: 98.2 F (36.8 C) 98.4 F (36.9 C)  98.5 F (36.9 C)  TempSrc: Oral Oral  Oral  SpO2: 99% 100% 100% 100%  Weight:      Height:        Intake/Output Summary (Last 24 hours) at 11/17/16 1900 Last data filed at 11/17/16 1800  Gross per 24 hour  Intake          4145.55 ml  Output              452 ml  Net          3693.55 ml   Filed Weights   11/16/16 0927 11/16/16 1743  Weight: 68 kg (150 lb) 68 kg (150 lb)   Examination: Physical Exam:  Constitutional: Thin elderly and appears calm but older than stated age. Eyes: Lids and conjunctivae normal, sclerae anicteric  ENMT: External Ears, Nose appear normal. Grossly normal hearing. Mucous membranes are dry.  Neck: Appears normal, supple, no cervical masses, normal ROM, no appreciable thyromegaly, no JVD Respiratory: Diminished to auscultation bilaterally, no wheezing, rales, rhonchi or crackles. Normal respiratory effort and patient is not tachypenic. No accessory muscle use.  Cardiovascular: RRR, no murmurs / rubs / gallops. S1 and S2 auscultated.  Abdomen: Soft, non-tender, non-distended. No masses palpated. No appreciable hepatosplenomegaly. Bowel sounds positive x4.  GU: Deferred. Musculoskeletal: No clubbing / cyanosis of digits/nails.  Skin: No rashes, lesions, ulcers on limited skin evaluation. No induration; Warm and dry.  Neurologic: CN 2-12 grossly intact with no focal deficits. Romberg sign cerebellar reflexes not assessed.  Psychiatric: Normal judgment and insight. Alert and oriented x 3. Depressed mood and flat affect.   Data Reviewed: I have personally reviewed following labs and imaging studies  CBC:  Recent Labs Lab 11/16/16 0931 11/16/16 1442 11/16/16 2217 11/17/16 0850  WBC 5.8 6.7 7.8 8.6   NEUTROABS  --   --   --  5.9  HGB 10.6* 10.5* 9.7* 9.2*  HCT 36.8* 32.8* 29.4* 26.9*  MCV 105.7* 96.5 90.5 89.4  PLT 148* 199 186 790*   Basic Metabolic Panel:  Recent Labs Lab 11/16/16 1442 11/16/16 1754 11/16/16 2217 11/17/16 0250 11/17/16 1054  NA 138 139 138 139 141  K 3.5 3.5 3.7 4.0 3.9  CL 108 111 112* 112* 111  CO2 <7* 9* 11* 11* 14*  GLUCOSE 499* 242* 195* 147* 146*  BUN 66* 62* 60* 58* 51*  CREATININE 3.35* 3.26* 2.96* 2.88* 2.68*  CALCIUM 7.4* 7.7* 7.4* 7.1* 7.3*  MG 2.0  --   --   --   --   PHOS 4.5  --   --   --   --    GFR: Estimated Creatinine Clearance: 24.3 mL/min (by C-G formula based on SCr of 2.68 mg/dL (H)). Liver Function Tests:  Recent Labs Lab 11/16/16 1442 11/17/16 0250  AST 140* 164*  ALT 119* 103*  ALKPHOS 228* 188*  BILITOT 0.7 0.7  PROT 5.8* 4.4*  ALBUMIN 2.8* 2.2*   No results for input(s): LIPASE, AMYLASE in the last 168 hours. No results for input(s): AMMONIA in the last 168 hours. Coagulation Profile: No results for input(s):  INR, PROTIME in the last 168 hours. Cardiac Enzymes: No results for input(s): CKTOTAL, CKMB, CKMBINDEX, TROPONINI in the last 168 hours. BNP (last 3 results) No results for input(s): PROBNP in the last 8760 hours. HbA1C: No results for input(s): HGBA1C in the last 72 hours. CBG:  Recent Labs Lab 11/17/16 1405 11/17/16 1513 11/17/16 1618 11/17/16 1716 11/17/16 1823  GLUCAP 191* 269* 199* 150* 119*   Lipid Profile: No results for input(s): CHOL, HDL, LDLCALC, TRIG, CHOLHDL, LDLDIRECT in the last 72 hours. Thyroid Function Tests: No results for input(s): TSH, T4TOTAL, FREET4, T3FREE, THYROIDAB in the last 72 hours. Anemia Panel: No results for input(s): VITAMINB12, FOLATE, FERRITIN, TIBC, IRON, RETICCTPCT in the last 72 hours. Sepsis Labs: No results for input(s): PROCALCITON, LATICACIDVEN in the last 168 hours.  Recent Results (from the past 240 hour(s))  MRSA PCR Screening     Status: None     Collection Time: 11/16/16  5:54 PM  Result Value Ref Range Status   MRSA by PCR NEGATIVE NEGATIVE Final    Comment:        The GeneXpert MRSA Assay (FDA approved for NASAL specimens only), is one component of a comprehensive MRSA colonization surveillance program. It is not intended to diagnose MRSA infection nor to guide or monitor treatment for MRSA infections.     Radiology Studies: Dg Chest Port 1 View  Result Date: 11/16/2016 CLINICAL DATA:  Generalized weakness and rapid respiratory rate. EXAM: PORTABLE CHEST 1 VIEW COMPARISON:  10/02/2016 FINDINGS: The cardiac silhouette, mediastinal and hilar contours are within normal limits and stable. There is mild tortuosity of the thoracic aorta. The lungs are clear. No pleural effusion. The bony thorax is intact. IMPRESSION: No acute cardiopulmonary findings. Electronically Signed   By: Marijo Sanes M.D.   On: 11/16/2016 10:47   Scheduled Meds: . carvedilol  6.25 mg Oral BID WC  . FLUoxetine  20 mg Oral Daily  . thiamine  100 mg Oral Daily   Continuous Infusions: . sodium chloride Stopped (11/17/16 0700)  . sodium chloride Stopped (11/17/16 0700)  . dextrose 5 % and 0.45% NaCl 75 mL/hr at 11/17/16 1719  . insulin (NOVOLIN-R) infusion 1.2 Units/hr (11/17/16 1825)  .  sodium bicarbonate (isotonic) infusion in sterile water 75 mL/hr at 11/17/16 0446    LOS: 1 day   [name], DO [facility] Pager [phone]  If 7PM-7AM, please contact night-coverage [email or web address] Password [email or web address] 11/17/2016, 7:00 PM

## 2016-11-17 NOTE — Telephone Encounter (Signed)
Patient dismissed from [facility] by Mauricio Po [name] , effective November 14, 2016. Dismissal letter sent out by certified / registered mail.  DAJ

## 2016-11-17 NOTE — Progress Notes (Addendum)
Inpatient Diabetes Program Recommendations  AACE/ADA: New Consensus Statement on Inpatient Glycemic Control (2015)  Target Ranges:  Prepandial:   less than 140 mg/dL      Peak postprandial:   less than 180 mg/dL (1-2 hours)      Critically ill patients:  140 - 180 mg/dL   Lab Results  Component Value Date   GLUCAP 208 (H) 11/17/2016   HGBA1C 8.9 (H) 08/17/2016    Review of Glycemic Control Results for KEVEN, OSBORN (MRN [id number]) as of 11/17/2016 09:18  Ref. Range 11/17/2016 03:36 11/17/2016 04:30 11/17/2016 05:25 11/17/2016 06:34 11/17/2016 07:40  Glucose-Capillary Latest Ref Range: 65 - 99 mg/dL 145 (H) 164 (H) 167 (H) 168 (H) 208 (H)   Diabetes history: DM Outpatient Diabetes medications: Lantus 10 units qd + Novolog 0-12 units tid meal coverage Current orders for Inpatient glycemic control: IV insulin drip  Inpatient Diabetes Program Recommendations:  Noted CO2 11 and anion gap 16 @ 0250 today. When patient meets criteria for transition off of IV insulin drip, please give Lantus insulin 2 hrs prior to discontinuation of drip and cover CBG when insulin drip discontinued. Followed by DM Coordinator on admission in December when admitted with hypoglycemia. Will follow patient.  01:40 pm: Spoke with patient and patient isn't able to tell me what type of insulin he takes but said he takes 20 units bid. Spoke to daughter Coralyn Mark by phone [phone]) and daughter said she checks patient's CBGs approx. 5 times daily but unsure of what patient is taking. Reviewed with daughter-patient discharge orders  On October 07, 2016 of Lantus 10 units daily with specific Novolog sliding scale. Daughter Coralyn Mark assists administering Novolog insulin when CBG "high" but does not have a specific sliding scale she is using. Daughter also attends PCP visits with patient. Reviewed with daughter to have list of prescribed insulin with printed sliding scale insulin. Daughter is checking with patient's sisters to inquire if  patient's Lantus is @ their home. Nurses please educate daughter on insulin administration as well as patient to determine educational needs. Explained to daughter if patient needs to be on a different insulin due to cost, may switch to 70/30 insulin.  Thank you, Nani Gasser. Khristi Schiller, RN, MSN, CDE Inpatient Glycemic Control Team Team Pager [phone] (8am-5pm) 11/17/2016 9:24 AM

## 2016-11-18 LAB — BASIC METABOLIC PANEL
Anion gap: 14 (ref 5–15)
Anion gap: 11 (ref 5–15)
BUN: 45 mg/dL — ABNORMAL HIGH (ref 6–20)
BUN: 34 mg/dL — ABNORMAL HIGH (ref 6–20)
CO2: 16 mmol/L — ABNORMAL LOW (ref 22–32)
CO2: 19 mmol/L — ABNORMAL LOW (ref 22–32)
Calcium: 6.8 mg/dL — ABNORMAL LOW (ref 8.9–10.3)
Calcium: 7 mg/dL — ABNORMAL LOW (ref 8.9–10.3)
Chloride: 110 mmol/L (ref 101–111)
Chloride: 109 mmol/L (ref 101–111)
Creatinine, Ser: 2.33 mg/dL — ABNORMAL HIGH (ref 0.61–1.24)
Creatinine, Ser: 1.99 mg/dL — ABNORMAL HIGH (ref 0.61–1.24)
GFR calc Af Amer: 31 mL/min — ABNORMAL LOW (ref >60)
GFR calc Af Amer: 37 mL/min — ABNORMAL LOW (ref >60)
GFR calc non Af Amer: 26 mL/min — ABNORMAL LOW (ref >60)
GFR calc non Af Amer: 32 mL/min — ABNORMAL LOW (ref >60)
Glucose, Bld: 119 mg/dL — ABNORMAL HIGH (ref 65–99)
Glucose, Bld: 107 mg/dL — ABNORMAL HIGH (ref 65–99)
Potassium: 3.2 mmol/L — ABNORMAL LOW (ref 3.5–5.1)
Potassium: 3.3 mmol/L — ABNORMAL LOW (ref 3.5–5.1)
Sodium: 140 mmol/L (ref 135–145)
Sodium: 139 mmol/L (ref 135–145)

## 2016-11-18 LAB — BLOOD GAS, VENOUS

## 2016-11-18 LAB — GLUCOSE, CAPILLARY
Glucose-Capillary: 122 mg/dL — ABNORMAL HIGH (ref 65–99)
Glucose-Capillary: 126 mg/dL — ABNORMAL HIGH (ref 65–99)
Glucose-Capillary: 112 mg/dL — ABNORMAL HIGH (ref 65–99)
Glucose-Capillary: 115 mg/dL — ABNORMAL HIGH (ref 65–99)
Glucose-Capillary: 173 mg/dL — ABNORMAL HIGH (ref 65–99)
Glucose-Capillary: 258 mg/dL — ABNORMAL HIGH (ref 65–99)
Glucose-Capillary: 189 mg/dL — ABNORMAL HIGH (ref 65–99)
Glucose-Capillary: 166 mg/dL — ABNORMAL HIGH (ref 65–99)
Glucose-Capillary: 163 mg/dL — ABNORMAL HIGH (ref 65–99)
Glucose-Capillary: 170 mg/dL — ABNORMAL HIGH (ref 65–99)
Glucose-Capillary: 163 mg/dL — ABNORMAL HIGH (ref 65–99)
Glucose-Capillary: 152 mg/dL — ABNORMAL HIGH (ref 65–99)
Glucose-Capillary: 80 mg/dL (ref 65–99)
Glucose-Capillary: 47 mg/dL — ABNORMAL LOW (ref 65–99)
Glucose-Capillary: 65 mg/dL (ref 65–99)
Glucose-Capillary: 82 mg/dL (ref 65–99)

## 2016-11-18 LAB — CBC WITH DIFFERENTIAL/PLATELET
Basophils Absolute: 0 10*3/uL (ref 0.0–0.1)
Basophils Relative: 0 %
Eosinophils Absolute: 0.1 10*3/uL (ref 0.0–0.7)
Eosinophils Relative: 2 %
HCT: 24.8 % — ABNORMAL LOW (ref 39.0–52.0)
Hemoglobin: 8.3 g/dL — ABNORMAL LOW (ref 13.0–17.0)
Lymphocytes Relative: 33 %
Lymphs Abs: 2 10*3/uL (ref 0.7–4.0)
MCH: 30.4 pg (ref 26.0–34.0)
MCHC: 33.5 g/dL (ref 30.0–36.0)
MCV: 90.8 fL (ref 78.0–100.0)
Monocytes Absolute: 0.4 10*3/uL (ref 0.1–1.0)
Monocytes Relative: 6 %
Neutro Abs: 3.6 10*3/uL (ref 1.7–7.7)
Neutrophils Relative %: 59 %
Platelets: 151 10*3/uL (ref 150–400)
RBC: 2.73 MIL/uL — ABNORMAL LOW (ref 4.22–5.81)
RDW: 15.8 % — ABNORMAL HIGH (ref 11.5–15.5)
WBC: 6.1 10*3/uL (ref 4.0–10.5)

## 2016-11-18 LAB — COMPREHENSIVE METABOLIC PANEL
ALT: 84 U/L — ABNORMAL HIGH (ref 17–63)
AST: 125 U/L — ABNORMAL HIGH (ref 15–41)
Albumin: 2 g/dL — ABNORMAL LOW (ref 3.5–5.0)
Alkaline Phosphatase: 175 U/L — ABNORMAL HIGH (ref 38–126)
Anion gap: 15 (ref 5–15)
BUN: 39 mg/dL — ABNORMAL HIGH (ref 6–20)
CO2: 17 mmol/L — ABNORMAL LOW (ref 22–32)
Calcium: 6.8 mg/dL — ABNORMAL LOW (ref 8.9–10.3)
Chloride: 108 mmol/L (ref 101–111)
Creatinine, Ser: 2.19 mg/dL — ABNORMAL HIGH (ref 0.61–1.24)
GFR calc Af Amer: 33 mL/min — ABNORMAL LOW (ref >60)
GFR calc non Af Amer: 29 mL/min — ABNORMAL LOW (ref >60)
Glucose, Bld: 190 mg/dL — ABNORMAL HIGH (ref 65–99)
Potassium: 3.5 mmol/L (ref 3.5–5.1)
Sodium: 140 mmol/L (ref 135–145)
Total Bilirubin: 0.4 mg/dL (ref 0.3–1.2)
Total Protein: 4.1 g/dL — ABNORMAL LOW (ref 6.5–8.1)

## 2016-11-18 LAB — HEMOGLOBIN AND HEMATOCRIT, BLOOD
HCT: 23.7 % — ABNORMAL LOW (ref 39.0–52.0)
Hemoglobin: 8 g/dL — ABNORMAL LOW (ref 13.0–17.0)

## 2016-11-18 LAB — PHOSPHORUS: Phosphorus: 2.4 mg/dL — ABNORMAL LOW (ref 2.5–4.6)

## 2016-11-18 LAB — MAGNESIUM: Magnesium: 1.5 mg/dL — ABNORMAL LOW (ref 1.7–2.4)

## 2016-11-18 MED ORDER — GLUCOSE 40 % PO GEL
ORAL | Status: AC
  Administered 2016-11-18: 37.5 g
  Filled 2016-11-18: qty 1

## 2016-11-18 MED ORDER — SODIUM CHLORIDE 0.9 % IV SOLN
30.0000 meq | Freq: Once | INTRAVENOUS | Status: AC
  Administered 2016-11-18: 30 meq via INTRAVENOUS
  Filled 2016-11-18: qty 15

## 2016-11-18 MED ORDER — DEXTROSE 50 % IV SOLN
1.0000 | INTRAVENOUS | Status: AC
Start: 2016-11-18 — End: 2016-11-18
  Administered 2016-11-18: 50 mL via INTRAVENOUS
  Filled 2016-11-18: qty 50

## 2016-11-18 MED ORDER — DEXTROSE 10 % IV SOLN
INTRAVENOUS | Status: DC
  Administered 2016-11-18 (×2): via INTRAVENOUS

## 2016-11-18 MED ORDER — SODIUM CHLORIDE 0.9 % IV BOLUS (SEPSIS)
500.0000 mL | Freq: Once | INTRAVENOUS | Status: AC
  Administered 2016-11-18: 500 mL via INTRAVENOUS

## 2016-11-18 MED ORDER — INSULIN ASPART 100 UNIT/ML ~~LOC~~ SOLN
0.0000 [IU] | Freq: Three times a day (TID) | SUBCUTANEOUS | Status: DC
  Administered 2016-11-18: 3 [IU] via SUBCUTANEOUS
  Administered 2016-11-19: 2 [IU] via SUBCUTANEOUS
  Administered 2016-11-19 – 2016-11-20 (×2): 3 [IU] via SUBCUTANEOUS
  Administered 2016-11-20: 11 [IU] via SUBCUTANEOUS

## 2016-11-18 MED ORDER — POTASSIUM CHLORIDE 2 MEQ/ML IV SOLN
30.0000 meq | Freq: Once | INTRAVENOUS | Status: AC
  Administered 2016-11-18: 30 meq via INTRAVENOUS
  Filled 2016-11-18: qty 15

## 2016-11-18 MED ORDER — SODIUM CHLORIDE 0.9 % IV SOLN
1.0000 g | Freq: Once | INTRAVENOUS | Status: AC
  Administered 2016-11-18: 1 g via INTRAVENOUS
  Filled 2016-11-18: qty 10

## 2016-11-18 MED ORDER — INSULIN GLARGINE 100 UNIT/ML ~~LOC~~ SOLN
10.0000 [IU] | Freq: Every day | SUBCUTANEOUS | Status: DC
  Administered 2016-11-18 – 2016-11-20 (×3): 10 [IU] via SUBCUTANEOUS
  Filled 2016-11-18 (×3): qty 0.1

## 2016-11-18 NOTE — Clinical Social Work Note (Signed)
Clinical Social Work Assessment  Patient Details  Name: [name] [name] MRN: [id number] Date of Birth: [birth year]  Date of referral:  11/18/16               Reason for consult:  Facility Placement                Permission sought to share information with:  Facility Sport and exercise psychologist, Family Supports Permission granted to share information::  Yes, Verbal Permission Granted  Name::     Engineer, site::  SNFs  Relationship::  Daughter  Contact Information:  [phone]  Housing/Transportation Living arrangements for the past 2 months:  Apartment Source of Information:  Patient, Adult Children Patient Interpreter Needed:  None Criminal Activity/Legal Involvement Pertinent to Current Situation/Hospitalization:  No - Comment as needed Significant Relationships:  Adult Children Lives with:  Self Do you feel safe going back to the place where you live?  No Need for family participation in patient care:  No (Coment)  Care giving concerns:  CSW received consult for possible SNF placement at time of discharge. CSW spoke with patient and patient's daughter regarding PT recommendation of SNF placement at time of discharge. Patient reported that he lives alone in an apartment. Patient expressed understanding of PT recommendation and may be agreeable to SNF placement at time of discharge. CSW to continue to follow and assist with discharge planning needs.   Social Worker assessment / plan:  CSW spoke with patient concerning possibility of rehab at [facility] before returning home.  Employment status:  Retired Nurse, adult PT Recommendations:  [place] / Referral to community resources:  [place]  Patient/Family's Response to care:  Patient recognizes need for rehab before returning home and may be agreeable to a SNF in [facility] as long as it is not [place] since he has been there many times  before.  Patient/Family's Understanding of and Emotional Response to Diagnosis, Current Treatment, and Prognosis:  Patient/family is realistic regarding therapy needs. Patient expressed understanding of CSW role and discharge process. No questions/concerns about plan or treatment.    Emotional Assessment Appearance:  Appears stated age Attitude/Demeanor/Rapport:  Other (Appropriate) Affect (typically observed):  Accepting, Appropriate Orientation:  Oriented to Self, Oriented to Situation, Oriented to Place, Oriented to  Time Alcohol / Substance use:  Not Applicable Psych involvement (Current and /or in the community):  No (Comment)  Discharge Needs  Concerns to be addressed:  Care Coordination Readmission within the last 30 days:  No Current discharge risk:  None Barriers to Discharge:  Continued Medical Work up   [employer], [place]oper 11/18/2016, 12:48 PM

## 2016-11-18 NOTE — Progress Notes (Signed)
PROGRESS NOTE    [name] [name]  UJW:[id number] DOB: April 06, 1946 DOA: 11/16/2016 PCP: Mauricio Po, [name]   Brief Narrative:  [name] [name] is a 71 y.o. male with medical history significant for chronic kidney disease stage III diabetes uncontrolled due to noncompliance, hypertension, CVA who presented to the ED with a chief complaint generalized weakness. Initial evaluation reveals DKA with anion gap 24 and bicarb 7. Daughter states he's had admissions for DKA in the past where his serum glucose was greater than 1600. Reports she went to visit him yesterday and noted him to be somewhat short of breath somewhat lethargic and generally weak. She states over the last several weeks she's noted a slow decline in his oral intake. She states his noncompliance with medications as long-standing. Patient reports some nausea and vomiting yesterday. He denies headache dizziness syncope or near-syncope. He denies chest pain palpitations cough fever chills lower extremity edema. As abdominal pain dysuria hematuria frequency or urgency. Currently in SDU and being transitioned off insulin gtt.   Assessment & Plan:   Principal Problem:   DKA (diabetic ketoacidoses) ([place]) Active Problems:   Benign essential HTN   Anxiety   History of CVA (cerebrovascular accident)   Noncompliance with medication regimen   Acute renal failure superimposed on stage 3 chronic kidney disease ([name])   Generalized weakness  #1. DKA likely related to noncompliance. improving -Serum glucose thousand 1158. An ion gap 24. CO2 7. Chart review indicates history of noncompliance the patient also with brittle diabetes and has had several hypoglycemic episodes. No signs symptoms of infection. He is provided with fluid resuscitation and insulin drip in the emergency department -Continue insulin drip per protocol -Repeat BMET this Afternoon showed Gap of 11 and Bicarb of 19;  -Transition to Home regimen of 10 of Lantus and sliding scale when  able to transition -Diabetic Diet -follow ABG -VBG ordered -Appreciate Diabetic Education Coordinators Recc's  -If goes to SNF then can continue Lantus but if goes home will need 70/30 7 units BID  #2. Acute renal failure superimposed on stage III chronic kidney disease.  -Improving with IVF Clearly related to above and decreased oral intake. Creatinine 3.56 on admission. Chart review indicates baseline range 0.7-1.2 -IV fluids as noted above -Hold nephrotoxins -Monitor urine output -Recheck in the morning  #3. Hypertension.  -Fair control in the emergency department. Home medications include amlodipine, Coreg, ramapril -Continue Coreg for now -Hold amlodipine and ACE inhibitor -Monitor blood pressure -Resume  #4. Hypokalemia -Potassium was 3.3 -Repelte -Repeat CMP in AM  #5. BRBPR/Melena -Positive Hemeoccult -Hb/Hct went from 10.5/32.8 -> 9.2/26.9 -> 8.3/24.8 -Type and Screen -No evidence of any bleeding and Nurse reports bowel movement being normal with no blood -GI Consult may be warranted if continues  #6. Abnormal LFT's -In the setting of Dehydration; Trending down -Recheck CMP -Possible RUQ Ultrasound if remains elevated -May warrant work up with Hepatitis Panel  #7. History of CVA. -Held Atorvastatin because of Abnormal LFT's  #8. Generalized Weakness.  -Likely related to above. However daughter indicates he has had 8 gradual decline over the last several weeks. Currently lives in apartment alone -PT consult recommends SNF and patient currently refusing; Will need to discuss with Patient and Daughter  #9. Noncompliance. -Daughter reports a long history of noncompliance. Chart review also indicates that his PCP at [facility] has "dismissed" patient for 3 or more no-shows. Daughter confirms patient's refusal to follow-up with physician -Care management for her new PCP  DVT prophylaxis: Anticoagulated with Xarelto  Code Status: FULL CODE Family Communication:  Discussed with Daughter at bedside Disposition Plan: Remain in SDU and Xfer to Medical Floor if Stable in AM  Consultants:   None   Procedures: None   Antimicrobials:  Anti-infectives    None     Subjective: Seen and examined and stated he wanted to eat. No Nausea, no vomiting. Per nurse patient had a BM and it was non-bloody. Patient stated he was noncompliant and didn't take his medications because he felt good. No other concerns or complaints.   Objective: Vitals:   11/18/16 1500 11/18/16 1600 11/18/16 1614 11/18/16 1957  BP:  (!) 109/58 (!) 109/58 (!) 113/59  Pulse:   61 (!) 57  Resp: 18 11 16 15   Temp:   99 F (37.2 C) 98.6 F (37 C)  TempSrc:   Oral Oral  SpO2:   100% 100%  Weight:      Height:        Intake/Output Summary (Last 24 hours) at 11/18/16 2154 Last data filed at 11/18/16 2000  Gross per 24 hour  Intake          2789.63 ml  Output             1351 ml  Net          1438.63 ml   Filed Weights   11/16/16 0927 11/16/16 1743  Weight: 68 kg (150 lb) 68 kg (150 lb)   Examination: Physical Exam:  Constitutional: Thin elderly and appears calm but older than stated age. Eyes: Lids and conjunctivae normal, sclerae anicteric  ENMT: External Ears, Nose appear normal. Grossly normal hearing. Mucous membranes are dry.  Neck: Appears normal, supple, no cervical masses, normal ROM, no appreciable thyromegaly, no JVD Respiratory: Diminished to auscultation bilaterally, no wheezing, rales, rhonchi or crackles. Normal respiratory effort and patient is not tachypenic. No accessory muscle use.  Cardiovascular: RRR, no murmurs / rubs / gallops. S1 and S2 auscultated.  Abdomen: Soft, non-tender, non-distended. No masses palpated. No appreciable hepatosplenomegaly. Bowel sounds positive x4.  GU: Deferred. Musculoskeletal: No clubbing / cyanosis of digits/nails.  Skin: No rashes, lesions, ulcers on limited skin evaluation. No induration; Warm and dry.  Neurologic: CN  2-12 grossly intact with no focal deficits. Romberg sign cerebellar reflexes not assessed.  Psychiatric: Normal judgment and insight. Alert and oriented x 3. Depressed mood and flat affect.   Data Reviewed: I have personally reviewed following labs and imaging studies  CBC:  Recent Labs Lab 11/16/16 0931 11/16/16 1442 11/16/16 2217 11/17/16 0850 11/17/16 1958 11/18/16 0716  WBC 5.8 6.7 7.8 8.6  --  6.1  NEUTROABS  --   --   --  5.9  --  3.6  HGB 10.6* 10.5* 9.7* 9.2* 7.9* 8.3*  HCT 36.8* 32.8* 29.4* 26.9* 23.0* 24.8*  MCV 105.7* 96.5 90.5 89.4  --  90.8  PLT 148* 199 186 145*  --  786   Basic Metabolic Panel:  Recent Labs Lab 11/16/16 1442  11/17/16 1054 11/17/16 1958 11/18/16 0211 11/18/16 0716 11/18/16 1342  NA 138  < > 141 133* 140 140 139  K 3.5  < > 3.9 3.1* 3.2* 3.5 3.3*  CL 108  < > 111 107 110 108 109  CO2 <7*  < > 14* 16* 16* 17* 19*  GLUCOSE 499*  < > 146* 103* 119* 190* 107*  BUN 66*  < > 51* 48* 45* 39* 34*  CREATININE 3.35*  < > 2.68* 2.45* 2.33* 2.19*  1.99*  CALCIUM 7.4*  < > 7.3* 6.7* 6.8* 6.8* 7.0*  MG 2.0  --   --   --   --  1.5*  --   PHOS 4.5  --   --   --   --  2.4*  --   < > = values in this interval not displayed. GFR: Estimated Creatinine Clearance: 32.7 mL/min (by C-G formula based on SCr of 1.99 mg/dL (H)). Liver Function Tests:  Recent Labs Lab 11/16/16 1442 11/17/16 0250 11/18/16 0716  AST 140* 164* 125*  ALT 119* 103* 84*  ALKPHOS 228* 188* 175*  BILITOT 0.7 0.7 0.4  PROT 5.8* 4.4* 4.1*  ALBUMIN 2.8* 2.2* 2.0*   No results for input(s): LIPASE, AMYLASE in the last 168 hours. No results for input(s): AMMONIA in the last 168 hours. Coagulation Profile: No results for input(s): INR, PROTIME in the last 168 hours. Cardiac Enzymes: No results for input(s): CKTOTAL, CKMB, CKMBINDEX, TROPONINI in the last 168 hours. BNP (last 3 results) No results for input(s): PROBNP in the last 8760 hours. HbA1C: No results for input(s):  HGBA1C in the last 72 hours. CBG:  Recent Labs Lab 11/18/16 0914 11/18/16 1030 11/18/16 1133 11/18/16 1617 11/18/16 2135  GLUCAP 170* 163* 152* 80 47*   Lipid Profile: No results for input(s): CHOL, HDL, LDLCALC, TRIG, CHOLHDL, LDLDIRECT in the last 72 hours. Thyroid Function Tests: No results for input(s): TSH, T4TOTAL, FREET4, T3FREE, THYROIDAB in the last 72 hours. Anemia Panel: No results for input(s): VITAMINB12, FOLATE, FERRITIN, TIBC, IRON, RETICCTPCT in the last 72 hours. Sepsis Labs: No results for input(s): PROCALCITON, LATICACIDVEN in the last 168 hours.  Recent Results (from the past 240 hour(s))  MRSA PCR Screening     Status: None   Collection Time: 11/16/16  5:54 PM  Result Value Ref Range Status   MRSA by PCR NEGATIVE NEGATIVE Final    Comment:        The GeneXpert MRSA Assay (FDA approved for NASAL specimens only), is one component of a comprehensive MRSA colonization surveillance program. It is not intended to diagnose MRSA infection nor to guide or monitor treatment for MRSA infections.     Radiology Studies: No results found. Scheduled Meds: . carvedilol  6.25 mg Oral BID WC  . FLUoxetine  20 mg Oral Daily  . insulin aspart  0-15 Units Subcutaneous TID WC  . insulin glargine  10 Units Subcutaneous Daily  . potassium chloride (KCL MULTIRUN) 30 mEq in 265 mL IVPB  30 mEq Intravenous Once  . thiamine  100 mg Oral Daily   Continuous Infusions: .  sodium bicarbonate (isotonic) infusion in sterile water 75 mL/hr at 11/18/16 2000    LOS: 2 days   [name], DO [facility] Pager [phone]  If 7PM-7AM, please contact night-coverage [email or web address] Password [facility] 11/18/2016, 9:54 PM

## 2016-11-18 NOTE — NC FL2 (Signed)
[place] LEVEL OF CARE SCREENING TOOL     IDENTIFICATION  Patient Name: [name] [name] Birthdate: 25-Oct-1945 Sex: male Admission Date (Current Location): 11/16/2016  [facility] and [place] Number:  Herbalist and Address:  [facility] [place], [place] [address] [place], [place] [address]      Provider Number: [id number]  Attending Physician Name and Address:  [name], DO  Relative Name and Phone Number:       Current Level of Care: Hospital Recommended Level of Care: [place] Prior Approval Number:    Date Approved/Denied:   PASRR Number: [id number]  Discharge Plan: SNF    Current Diagnoses: Patient Active Problem List   Diagnosis Date Noted  . DKA (diabetic ketoacidoses) ([place]) 11/16/2016  . Generalized weakness 11/16/2016  . Acute renal failure with acute tubular necrosis superimposed on stage 3 chronic kidney disease ([place])   . Type 2 diabetes mellitus with ketoacidotic coma, without long-term current use of insulin   . Elevated troponin   . Deep vein thrombosis (DVT) of left lower extremity ([place])   . Uncontrolled type 2 diabetes mellitus with complication ([place])   . Encephalopathy, metabolic   . Acute renal failure superimposed on stage 3 chronic kidney disease ([place])   . Idiopathic hypotension   . Non-traumatic rhabdomyolysis   . Diabetic acidosis without coma ([place]) 10/01/2016  . Fall 08/17/2016  . Rhabdomyolysis 08/17/2016  . Noncompliance with medication regimen 08/17/2016  . On continuous oral anticoagulation 08/17/2016  . Syncope and collapse 07/06/2016  . Left leg DVT ([place]) 07/06/2016  . Syncope 07/05/2016  . Postoperative anemia due to acute blood loss 06/18/2016  . Retroperitoneal hematoma 06/18/2016  . Vitamin B 12 deficiency 06/18/2016  . Elective surgery   . Postop check   . Closed comminuted intertrochanteric fracture of left femur ([place])   . Renal insufficiency   . Hyponatremia  05/15/2016  . Anxiety 05/15/2016  . History of CVA (cerebrovascular accident) 05/15/2016  . Acute on chronic renal failure ([place]) 05/09/2016  . Hypoglycemia 11/02/2015  . Chronic prescription benzodiazepine use 11/02/2015  . Narcotic dependency, continuous ([place]) 11/02/2015  . HTN (hypertension), malignant 11/01/2015  . Uncontrolled type 2 diabetes mellitus with diabetic nephropathy, with long-term current use of insulin ([place]) 08/12/2015  . Benign essential HTN 08/12/2015  . Dyslipidemia associated with type 2 diabetes mellitus ([place]) 08/12/2015  . Depression 08/12/2015  . CKD (chronic kidney disease) stage 3, GFR 30-59 ml/min 03/20/2014    Orientation RESPIRATION BLADDER Height & Weight     Self, Time, Situation, Place  Normal Continent Weight: 68 kg (150 lb) Height:  5\' 9"  (175.3 cm)  BEHAVIORAL SYMPTOMS/MOOD NEUROLOGICAL BOWEL NUTRITION STATUS      Continent Diet (Please see DC summary)  AMBULATORY STATUS COMMUNICATION OF NEEDS Skin   Limited Assist Verbally Normal                       Personal Care Assistance Level of Assistance  Bathing, Feeding, Dressing Bathing Assistance: Limited assistance Feeding assistance: Independent Dressing Assistance: Limited assistance     Functional Limitations Info             SPECIAL CARE FACTORS FREQUENCY  PT (By licensed PT)     PT Frequency: 5x/week              Contractures      Additional Factors Info  Code Status, Allergies, Psychotropic, Insulin Sliding Scale Code Status Info:  Full Allergies Info: NKA Psychotropic Info: Prozac Insulin Sliding Scale Info: 3 times daily        Current Medications (11/18/2016):  This is the current hospital active medication list Current Facility-Administered Medications  Medication Dose Route Frequency Provider Last Rate Last Dose  . carvedilol (COREG) tablet 6.25 mg  6.25 mg Oral BID WC Radene Gunning, NP   6.25 mg at 11/18/16 0817  . fentaNYL (SUBLIMAZE) injection 50 mcg  50  mcg Intravenous Q2H PRN Elwin Mocha, MD   50 mcg at 11/16/16 1438  . FLUoxetine (PROZAC) capsule 20 mg  20 mg Oral Daily Radene Gunning, NP   20 mg at 11/18/16 5790  . HYDROcodone-acetaminophen (NORCO/VICODIN) 5-325 MG per tablet 1 tablet  1 tablet Oral Q4H PRN Radene Gunning, NP   1 tablet at 11/17/16 1016  . insulin aspart (novoLOG) injection 0-15 Units  0-15 Units Subcutaneous TID WC [name], DO   3 Units at 11/18/16 1138  . insulin glargine (LANTUS) injection 10 Units  10 Units Subcutaneous Daily [facility], DO   10 Units at 11/18/16 1058  . sodium bicarbonate 150 mEq in sterile water 1,000 mL infusion   Intravenous Continuous Gardiner Barefoot, NP 75 mL/hr at 11/18/16 0920    . thiamine (VITAMIN B-1) tablet 100 mg  100 mg Oral Daily Radene Gunning, NP   100 mg at 11/18/16 3833     Discharge Medications: Please see discharge summary for a list of discharge medications.  Relevant Imaging Results:  Relevant Lab Results:   Additional Information SSN [id number]  Benard Halsted, LCSWA

## 2016-11-18 NOTE — Care Management Note (Signed)
Case Management Note  Patient Details  Name: [name] [name] MRN: [id number] Date of Birth: 18-Sep-1946  Subjective/Objective:      Adm w dka, noncompliance noted in chart              Action/Plan:from alf, sw ref has been placed   Expected Discharge Date:                  Expected Discharge Plan:  Assisted Living / Rest Home  In-House Referral:  Clinical Social Work  Discharge planning Services  CM Consult  Post Acute Care Choice:    Choice offered to:     DME Arranged:    DME Agency:     HH Arranged:    [place] Agency:     Status of Service:  In process, will continue to follow  If discussed at Long Length of Stay Meetings, dates discussed:    Additional Comments:supp da.  Lacretia Leigh, RN 11/18/2016, 8:55 AM

## 2016-11-18 NOTE — Progress Notes (Addendum)
12pm: Per RN, patient willing to go to different snf than [facility].   11:30am: CSW received consult regarding PT recommendation of SNF at discharge.  Patient is refusing SNF at this time and wants home health. CSW told patient that the medical team and his daughter would continue trying to get him to consider going to rehab for a short time for safety [employer] is very near his home). Patient's daughter reported that patient lives in an apartment (not ALF) alone.   CSW signing off. Please reconsult if patient reconsiders SNF.   Percell Locus Shawnya Mayor LCSWA [phone]

## 2016-11-18 NOTE — Progress Notes (Signed)
Inpatient Diabetes Program Recommendations  AACE/ADA: New Consensus Statement on Inpatient Glycemic Control (2015)  Target Ranges:  Prepandial:   less than 140 mg/dL      Peak postprandial:   less than 180 mg/dL (1-2 hours)      Critically ill patients:  140 - 180 mg/dL   Lab Results  Component Value Date   GLUCAP 163 (H) 11/18/2016   HGBA1C 8.9 (H) 08/17/2016    Review of Glycemic Control  Outpatient Diabetes medications: Lantus 10 units qd + Novolog 0-12 units tid meal coverage Current orders for Inpatient glycemic control: Lantus 10 units daily + Novolog correction 0-15 units tid  Inpatient Diabetes Program Recommendations:  Noted transitioning from IV insulin to SQ insulin.  Please consider -Decrease Novolog correction to 0-9 units tid + 0-5 units hs -Add Novolog 2-3 units meal coverage tid if eats 50%  Noted patient may be discharged to assisted living/rest home. If patient transitions to 70/30 insulin due to cost, may consider 7 units bid.  Thank you, Nani Gasser. Hayli Milligan, RN, MSN, CDE Inpatient Glycemic Control Team Team Pager [phone] (8am-5pm) 11/18/2016 11:23 AM

## 2016-11-19 DIAGNOSIS — I10 Essential (primary) hypertension: Secondary | ICD-10-CM

## 2016-11-19 DIAGNOSIS — E1310 Other specified diabetes mellitus with ketoacidosis without coma: Secondary | ICD-10-CM

## 2016-11-19 DIAGNOSIS — N179 Acute kidney failure, unspecified: Secondary | ICD-10-CM

## 2016-11-19 DIAGNOSIS — N183 Chronic kidney disease, stage 3 (moderate): Secondary | ICD-10-CM

## 2016-11-19 DIAGNOSIS — R531 Weakness: Secondary | ICD-10-CM

## 2016-11-19 LAB — IRON AND TIBC
Iron: 57 ug/dL (ref 45–182)
Saturation Ratios: 32 % (ref 17.9–39.5)
TIBC: 181 ug/dL — ABNORMAL LOW (ref 250–450)
UIBC: 124 ug/dL

## 2016-11-19 LAB — CBC WITH DIFFERENTIAL/PLATELET
Basophils Absolute: 0 10*3/uL (ref 0.0–0.1)
Basophils Relative: 0 %
Eosinophils Absolute: 0.2 10*3/uL (ref 0.0–0.7)
Eosinophils Relative: 4 %
HCT: 22.1 % — ABNORMAL LOW (ref 39.0–52.0)
Hemoglobin: 7.5 g/dL — ABNORMAL LOW (ref 13.0–17.0)
Lymphocytes Relative: 42 %
Lymphs Abs: 1.8 10*3/uL (ref 0.7–4.0)
MCH: 30.2 pg (ref 26.0–34.0)
MCHC: 33.9 g/dL (ref 30.0–36.0)
MCV: 89.1 fL (ref 78.0–100.0)
Monocytes Absolute: 0.3 10*3/uL (ref 0.1–1.0)
Monocytes Relative: 7 %
Neutro Abs: 2.1 10*3/uL (ref 1.7–7.7)
Neutrophils Relative %: 48 %
RBC: 2.48 MIL/uL — ABNORMAL LOW (ref 4.22–5.81)
RDW: 15.4 % (ref 11.5–15.5)
WBC: 4.3 10*3/uL (ref 4.0–10.5)

## 2016-11-19 LAB — RETICULOCYTES
RBC.: 2.58 MIL/uL — ABNORMAL LOW (ref 4.22–5.81)
Retic Count, Absolute: 15.5 10*3/uL — ABNORMAL LOW (ref 19.0–186.0)
Retic Ct Pct: 0.6 % (ref 0.4–3.1)

## 2016-11-19 LAB — GLUCOSE, CAPILLARY
Glucose-Capillary: 171 mg/dL — ABNORMAL HIGH (ref 65–99)
Glucose-Capillary: 127 mg/dL — ABNORMAL HIGH (ref 65–99)
Glucose-Capillary: 50 mg/dL — ABNORMAL LOW (ref 65–99)
Glucose-Capillary: 107 mg/dL — ABNORMAL HIGH (ref 65–99)

## 2016-11-19 LAB — VITAMIN B12: Vitamin B-12: 609 pg/mL (ref 180–914)

## 2016-11-19 LAB — COMPREHENSIVE METABOLIC PANEL
ALT: 100 U/L — ABNORMAL HIGH (ref 17–63)
AST: 225 U/L — ABNORMAL HIGH (ref 15–41)
Albumin: 1.9 g/dL — ABNORMAL LOW (ref 3.5–5.0)
Alkaline Phosphatase: 187 U/L — ABNORMAL HIGH (ref 38–126)
Anion gap: 11 (ref 5–15)
BUN: 30 mg/dL — ABNORMAL HIGH (ref 6–20)
CO2: 23 mmol/L (ref 22–32)
Calcium: 6.6 mg/dL — ABNORMAL LOW (ref 8.9–10.3)
Chloride: 105 mmol/L (ref 101–111)
Creatinine, Ser: 1.82 mg/dL — ABNORMAL HIGH (ref 0.61–1.24)
GFR calc Af Amer: 41 mL/min — ABNORMAL LOW (ref >60)
GFR calc non Af Amer: 36 mL/min — ABNORMAL LOW (ref >60)
Glucose, Bld: 99 mg/dL (ref 65–99)
Potassium: 3.9 mmol/L (ref 3.5–5.1)
Sodium: 139 mmol/L (ref 135–145)
Total Bilirubin: 0.2 mg/dL — ABNORMAL LOW (ref 0.3–1.2)
Total Protein: 4.1 g/dL — ABNORMAL LOW (ref 6.5–8.1)

## 2016-11-19 LAB — FERRITIN: Ferritin: 408 ng/mL — ABNORMAL HIGH (ref 24–336)

## 2016-11-19 LAB — FOLATE: Folate: 10.1 ng/mL (ref >5.9)

## 2016-11-19 LAB — MAGNESIUM: Magnesium: 1.5 mg/dL — ABNORMAL LOW (ref 1.7–2.4)

## 2016-11-19 LAB — PHOSPHORUS: Phosphorus: 1.8 mg/dL — ABNORMAL LOW (ref 2.5–4.6)

## 2016-11-19 MED ORDER — ONDANSETRON HCL 4 MG/2ML IJ SOLN: 4.0000 mg | Freq: Four times a day (QID) | INTRAMUSCULAR | Status: DC | PRN

## 2016-11-19 MED ORDER — HEPARIN SODIUM (PORCINE) 5000 UNIT/ML IJ SOLN
5000.0000 [IU] | Freq: Three times a day (TID) | INTRAMUSCULAR | Status: DC
  Administered 2016-11-19 – 2016-11-20 (×4): 5000 [IU] via SUBCUTANEOUS
  Filled 2016-11-19 (×4): qty 1

## 2016-11-19 MED ORDER — TRAMADOL HCL 50 MG PO TABS
25.0000 mg | ORAL_TABLET | Freq: Four times a day (QID) | ORAL | Status: DC | PRN
  Administered 2016-11-19 – 2016-11-20 (×2): 25 mg via ORAL
  Filled 2016-11-19 (×2): qty 1

## 2016-11-19 NOTE — Progress Notes (Deleted)
Pasrr received: [id number] [place] LCSWA [phone]

## 2016-11-19 NOTE — Progress Notes (Signed)
Report given to 5w nurse at this time.  Pt transferring at this time.  NO c/o pain or s/s of any acute distress.  cbg now 107

## 2016-11-19 NOTE — Progress Notes (Signed)
Physical Therapy Treatment Patient Details Name: [name] [name] MRN: [id number] DOB: 07/06/1946 Today's Date: Nov 25, 2016    History of Present Illness Pt adm with weakness and DKA. PMH - DM, CVA, CKD, femur fx, htn    PT Comments    Pt making steady progress. Pt agreeable to SNF.  Follow Up Recommendations  SNF     Equipment Recommendations  None recommended by PT    Recommendations for Other Services       Precautions / Restrictions Precautions Precautions: Fall Restrictions Weight Bearing Restrictions: No    Mobility  Bed Mobility Overal bed mobility: Needs Assistance Bed Mobility: Supine to Sit;Sit to Supine     Supine to sit: Min assist Sit to supine: Min guard   General bed mobility comments: Assist to elevate trunk into sitting  Transfers Overall transfer level: Needs assistance Equipment used: Rolling walker (2 wheeled) Transfers: Sit to/from Stand Sit to Stand: Min assist         General transfer comment: Assist to bring hips up and for balance  Ambulation/Gait Ambulation/Gait assistance: Min assist Ambulation Distance (Feet): 125 Feet Assistive device: Rolling walker (2 wheeled) Gait Pattern/deviations: Step-through pattern;Decreased step length - right;Decreased step length - left;Trunk flexed;Drifts right/left Gait velocity: decr Gait velocity interpretation: Below normal speed for age/gender General Gait Details: Pt with unsteady gait requiring assist for balance   Stairs            Wheelchair Mobility    Modified Rankin (Stroke Patients Only)       Balance Overall balance assessment: Needs assistance Sitting-balance support: Feet supported;No upper extremity supported Sitting balance-Leahy Scale: Fair     Standing balance support: Bilateral upper extremity supported Standing balance-Leahy Scale: Poor Standing balance comment: walker and min A for static standing                    Cognition Arousal/Alertness:  Awake/alert Behavior During Therapy: WFL for tasks assessed/performed Overall Cognitive Status: No family/caregiver present to determine baseline cognitive functioning                 General Comments: Decr short term memory    Exercises      General Comments        Pertinent Vitals/Pain Pain Assessment: No/denies pain    Home Living                      Prior Function            PT Goals (current goals can now be found in the care plan section) Progress towards PT goals: Progressing toward goals    Frequency    Min 2X/week      PT Plan Current plan remains appropriate;Frequency needs to be updated    Co-evaluation             End of Session Equipment Utilized During Treatment: Gait belt Activity Tolerance: Patient tolerated treatment well Patient left: with call bell/phone within reach;in bed;with bed alarm set     Time: 1131-1150 PT Time Calculation (min) (ACUTE ONLY): 19 min  Charges:  $Gait Training: 8-22 mins                    G CodesShary Decamp [place] 11-25-2016, 12:28 PM [employer] PT [phone]

## 2016-11-19 NOTE — Progress Notes (Signed)
Inpatient Diabetes Program Recommendations  AACE/ADA: New Consensus Statement on Inpatient Glycemic Control (2015)  Target Ranges:  Prepandial:   less than 140 mg/dL      Peak postprandial:   less than 180 mg/dL (1-2 hours)      Critically ill patients:  140 - 180 mg/dL   Lab Results  Component Value Date   GLUCAP 127 (H) 11/19/2016   HGBA1C 8.9 (H) 08/17/2016    Review of Glycemic Control Results for SHEA, KAPUR (MRN [id number]) as of 11/19/2016 12:11  Ref. Range 11/18/2016 21:35 11/18/2016 22:19 11/18/2016 23:25 11/19/2016 08:05 11/19/2016 11:49  Glucose-Capillary Latest Ref Range: 65 - 99 mg/dL 47 (L) 65 82 171 (H) 127 (H)   Inpatient Diabetes Program Recommendations:  Please consider decrease in Novolog correction to sensitive 0-9 units tid. Noted post correction hypoglycemia. Text page to Dr. Sloan Leiter.  Thank you, Nani Gasser. Inita Uram, RN, MSN, CDE Inpatient Glycemic Control Team Team Pager [phone] (8am-5pm) 11/19/2016 12:13 PM

## 2016-11-19 NOTE — Progress Notes (Signed)
Pts cbg 51.  Pt already eating and gave ensure.  Notified ultrasound of pt eating and drinking and will keep npo after mn for them to do [place] in am.

## 2016-11-19 NOTE — Progress Notes (Signed)
PROGRESS NOTE        PATIENT DETAILS Name: [name] [name] Age: 71 y.o. Sex: male Date of Birth: Aug 02, 1946 Admit Date: 11/16/2016 Admitting Physician Elwin Mocha, MD OTL:[id number], Belenda Cruise, [name]  Brief Narrative: Patient is a 71 y.o. male with chronic kidney disease stage III, uncontrolled type 2 diabetes due to noncompliance, hypertension, admitted on 2/4 with diabetic ketoacidosis, acute on chronic kidney disease. Admitted and started on IV insulin, DKA has resolved. Unfortunately patient is very deconditioned, and require SNF placement. Social worker following.  Subjective: Although better-weak and deconditioned. Denies any chest pain or shortness of breath.  Assessment/Plan: DKA (diabetic ketoacidoses): Resolved, initially required IV insulin-now transitioned to subcutaneous insulin with stable CBGs. A1c pending.  Insulin-dependent type 2 diabetes: CBGs stable with 10 units of Lantus and SSI. Follow and adjust accordingly  Anemia: Suspect at baseline has anemia of chronic kidney disease, likely worsened due to acute illness. Although has Hemoccult-positive stools, no overt evidence of GI loss. Anemia panel with elevated ferritin-consistent with chronic disease. Follow and transfuse if hemoglobin less than 7. Can pursue outpatient colonoscopy when less deconditioned and improved.  Elevated LFTs: Suspect secondary to shock liver/acute illness. No RUQ tenderness on exam. Although downtrending seems to have plateaued. Check acute hepatitis serology, right upper quadrant ultrasound and trend liver enzymes.  History of CVA: Nonfocal exam-continue to hold statin due to elevated LFTs-hold aspirin due to anemia. Resume when able  Hypertension: Controlled with Coreg, resume ramipril and amlodipine or the next few days.  Dyslipidemia: Continue to hold statin due to elevated LFTs. Resume when able.  History of DVT left lower extremity: Recent Doppler on 10/02/16 and  negative for DVT. Seems to have already completed 3 months of treatment. Given Anemia, significant deconditioning with increased fall risk-suspect we can discontinue anticoagulation at this time.  Generalized weakness/physical deconditioning: Recent multiple hospitalizations-elderly-now subsequently weak following acute illness. Agreeable for SNF  History of noncompliance to medications and follow-up: Counseled. 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  DVT Prophylaxis: Begin Prophylactic Heparin   Code Status: Full code   Family Communication: None at bedside  Disposition Plan: Remain inpatient-transfer to med surg unit- SNF on discharge-likely 2/8  Antimicrobial agents: Anti-infectives    None      Procedures: None  CONSULTS:  None  Time spent: 25- minutes-Greater than 50% of this time was spent in counseling, explanation of diagnosis, planning of further management, and coordination of care.  MEDICATIONS: Scheduled Meds: . carvedilol  6.25 mg Oral BID WC  . FLUoxetine  20 mg Oral Daily  . insulin aspart  0-15 Units Subcutaneous TID WC  . insulin glargine  10 Units Subcutaneous Daily  . thiamine  100 mg Oral Daily   Continuous Infusions: .  sodium bicarbonate (isotonic) infusion in sterile water 75 mL/hr at 11/19/16 1100   PRN Meds:.fentaNYL (SUBLIMAZE) injection, HYDROcodone-acetaminophen   PHYSICAL EXAM: Vital signs: Vitals:   11/19/16 0800 11/19/16 0900 11/19/16 1000 11/19/16 1100  BP: (!) 156/75   (!) 145/82  Pulse: (!) 50 (!) 57 (!) 55 (!) 55  Resp: 18 20 19 17   Temp:    98.6 F (37 C)  TempSrc:    Oral  SpO2: 99% 100% 100% 99%  Weight:      Height:       Filed Weights   11/16/16 0927 11/16/16 1743  Weight: 68 kg (150 lb) 68 kg (150 lb)   Body mass index is 22.15 kg/m.   General appearance :Awake, alert, not in any distress. Speech Clear. Chronically sick appearing Eyes:, pupils equally reactive to light and accomodation,no scleral  icterus. HEENT: Atraumatic and Normocephalic Neck: supple, no JVD. No cervical lymphadenopathy.  Resp:Good air entry bilaterally, no added sounds  CVS: S1 S2 regular, no murmurs.  GI: Bowel sounds present, Non tender and not distended with no gaurding, rigidity or rebound.No organomegaly Extremities: B/L Lower Ext shows no edema, both legs are warm to touch Neurology:  speech clear,Non focal, sensation is grossly intact. Musculoskeletal:No digital cyanosis Skin:No Rash, warm and dry Wounds:N/A  I have personally reviewed following labs and imaging studies  LABORATORY DATA: CBC:  Recent Labs Lab 11/16/16 1442 11/16/16 2217 11/17/16 0850 11/17/16 1958 11/18/16 0716 11/18/16 2206 11/19/16 0219  WBC 6.7 7.8 8.6  --  6.1  --  4.3  NEUTROABS  --   --  5.9  --  3.6  --  2.1  HGB 10.5* 9.7* 9.2* 7.9* 8.3* 8.0* 7.5*  HCT 32.8* 29.4* 26.9* 23.0* 24.8* 23.7* 22.1*  MCV 96.5 90.5 89.4  --  90.8  --  89.1  PLT 199 186 145*  --  151  --  MACEDA, J. SAMPLE WILL BE CREDITED AND RECOLLECTED.    Basic Metabolic Panel:  Recent Labs Lab 11/16/16 1442  11/17/16 1958 11/18/16 0211 11/18/16 0716 11/18/16 1342 11/19/16 0219  NA 138  < > 133* 140 140 139 139  K 3.5  < > 3.1* 3.2* 3.5 3.3* 3.9  CL 108  < > 107 110 108 109 105  CO2 <7*  < > 16* 16* 17* 19* 23  GLUCOSE 499*  < > 103* 119* 190* 107* 99  BUN 66*  < > 48* 45* 39* 34* 30*  CREATININE 3.35*  < > 2.45* 2.33* 2.19* 1.99* 1.82*  CALCIUM 7.4*  < > 6.7* 6.8* 6.8* 7.0* 6.6*  MG 2.0  --   --   --  1.5*  --  1.5*  PHOS 4.5  --   --   --  2.4*  --  1.8*  < > = values in this interval not displayed.  GFR: Estimated Creatinine Clearance: 35.8 mL/min (by C-G formula based on SCr of 1.82 mg/dL (H)).  Liver Function Tests:  Recent Labs Lab 11/16/16 1442 11/17/16 0250 11/18/16 0716 11/19/16 0219  AST 140* 164* 125* 225*  ALT 119* 103* 84* 100*  ALKPHOS 228* 188* 175* 187*  BILITOT 0.7 0.7 0.4 0.2*  PROT 5.8* 4.4* 4.1* 4.1*    ALBUMIN 2.8* 2.2* 2.0* 1.9*   No results for input(s): LIPASE, AMYLASE in the last 168 hours. No results for input(s): AMMONIA in the last 168 hours.  Coagulation Profile: No results for input(s): INR, PROTIME in the last 168 hours.  Cardiac Enzymes: No results for input(s): CKTOTAL, CKMB, CKMBINDEX, TROPONINI in the last 168 hours.  BNP (last 3 results) No results for input(s): PROBNP in the last 8760 hours.  HbA1C: No results for input(s): HGBA1C in the last 72 hours.  CBG:  Recent Labs Lab 11/18/16 2135 11/18/16 2219 11/18/16 2325 11/19/16 0805 11/19/16 1149  GLUCAP 47* 65 82 171* 127*    Lipid Profile: No results for input(s): CHOL, HDL, LDLCALC, TRIG, CHOLHDL, LDLDIRECT in the last 72 hours.  Thyroid Function Tests: No results for input(s): TSH, T4TOTAL, FREET4, T3FREE, THYROIDAB in the last 72 hours.  Anemia Panel:  Recent Labs  11/19/16 1011  VITAMINB12 609  FOLATE 10.1  FERRITIN 408*  TIBC 181*  IRON 57  RETICCTPCT 0.6    Urine analysis:    Component Value Date/Time   COLORURINE YELLOW 11/16/2016 2331   APPEARANCEUR HAZY (A) 11/16/2016 2331   LABSPEC 1.009 11/16/2016 2331   PHURINE 5.0 11/16/2016 2331   GLUCOSEU >=500 (A) 11/16/2016 2331   HGBUR SMALL (A) 11/16/2016 2331   BILIRUBINUR NEGATIVE 11/16/2016 2331   KETONESUR NEGATIVE 11/16/2016 2331   PROTEINUR 30 (A) 11/16/2016 2331   UROBILINOGEN 0.2 08/21/2015 2154   NITRITE NEGATIVE 11/16/2016 2331   LEUKOCYTESUR NEGATIVE 11/16/2016 2331    Sepsis Labs: Lactic Acid, Venous    Component Value Date/Time   LATICACIDVEN 1.5 10/06/2016 0435    MICROBIOLOGY: Recent Results (from the past 240 hour(s))  MRSA PCR Screening     Status: None   Collection Time: 11/16/16  5:54 PM  Result Value Ref Range Status   MRSA by PCR NEGATIVE NEGATIVE Final    Comment:        The GeneXpert MRSA Assay (FDA approved for NASAL specimens only), is one component of a comprehensive MRSA  colonization surveillance program. It is not intended to diagnose MRSA infection nor to guide or monitor treatment for MRSA infections.     RADIOLOGY STUDIES/RESULTS: Dg Chest Port 1 View  Result Date: 11/16/2016 CLINICAL DATA:  Generalized weakness and rapid respiratory rate. EXAM: PORTABLE CHEST 1 VIEW COMPARISON:  10/02/2016 FINDINGS: The cardiac silhouette, mediastinal and hilar contours are within normal limits and stable. There is mild tortuosity of the thoracic aorta. The lungs are clear. No pleural effusion. The bony thorax is intact. IMPRESSION: No acute cardiopulmonary findings. Electronically Signed   By: Marijo Sanes M.D.   On: 11/16/2016 10:47     LOS: 3 days   Oren Binet, MD  [facility] Pager:[phone]  If 7PM-7AM, please contact night-coverage [email or web address] Password [email or web address] 11/19/2016, 2:08 PM

## 2016-11-20 ENCOUNTER — Inpatient Hospital Stay (HOSPITAL_COMMUNITY): Payer: Medicare HMO

## 2016-11-20 LAB — CBC
HCT: 21.8 % — ABNORMAL LOW (ref 39.0–52.0)
Hemoglobin: 7.4 g/dL — ABNORMAL LOW (ref 13.0–17.0)
MCH: 30.3 pg (ref 26.0–34.0)
MCHC: 33.9 g/dL (ref 30.0–36.0)
MCV: 89.3 fL (ref 78.0–100.0)
Platelets: 147 10*3/uL — ABNORMAL LOW (ref 150–400)
RBC: 2.44 MIL/uL — ABNORMAL LOW (ref 4.22–5.81)
RDW: 15.1 % (ref 11.5–15.5)
WBC: 4.3 10*3/uL (ref 4.0–10.5)

## 2016-11-20 LAB — COMPREHENSIVE METABOLIC PANEL
ALT: 102 U/L — ABNORMAL HIGH (ref 17–63)
AST: 211 U/L — ABNORMAL HIGH (ref 15–41)
Albumin: 1.8 g/dL — ABNORMAL LOW (ref 3.5–5.0)
Alkaline Phosphatase: 184 U/L — ABNORMAL HIGH (ref 38–126)
Anion gap: 10 (ref 5–15)
BUN: 21 mg/dL — ABNORMAL HIGH (ref 6–20)
CO2: 29 mmol/L (ref 22–32)
Calcium: 6.5 mg/dL — ABNORMAL LOW (ref 8.9–10.3)
Chloride: 99 mmol/L — ABNORMAL LOW (ref 101–111)
Creatinine, Ser: 1.36 mg/dL — ABNORMAL HIGH (ref 0.61–1.24)
GFR calc Af Amer: 59 mL/min — ABNORMAL LOW (ref >60)
GFR calc non Af Amer: 51 mL/min — ABNORMAL LOW (ref >60)
Glucose, Bld: 62 mg/dL — ABNORMAL LOW (ref 65–99)
Potassium: 3.4 mmol/L — ABNORMAL LOW (ref 3.5–5.1)
Sodium: 138 mmol/L (ref 135–145)
Total Bilirubin: 0.4 mg/dL (ref 0.3–1.2)
Total Protein: 4 g/dL — ABNORMAL LOW (ref 6.5–8.1)

## 2016-11-20 LAB — GLUCOSE, CAPILLARY
Glucose-Capillary: 117 mg/dL — ABNORMAL HIGH (ref 65–99)
Glucose-Capillary: 151 mg/dL — ABNORMAL HIGH (ref 65–99)
Glucose-Capillary: 341 mg/dL — ABNORMAL HIGH (ref 65–99)
Glucose-Capillary: 76 mg/dL (ref 65–99)

## 2016-11-20 LAB — HEPATITIS PANEL, ACUTE
HCV Ab: <0.1 s/co ratio (ref 0.0–0.9)
Hep A IgM: NEGATIVE
Hep B C IgM: NEGATIVE
Hepatitis B Surface Ag: NEGATIVE

## 2016-11-20 LAB — HEMOGLOBIN A1C
Hgb A1c MFr Bld: 10.5 % — ABNORMAL HIGH (ref 4.8–5.6)
Mean Plasma Glucose: 255 mg/dL

## 2016-11-20 MED ORDER — ASPIRIN 81 MG PO TBEC: 81.0000 mg | DELAYED_RELEASE_TABLET | Freq: Every day | ORAL | Status: DC

## 2016-11-20 MED ORDER — ASPIRIN EC 81 MG PO TBEC
81.0000 mg | DELAYED_RELEASE_TABLET | Freq: Every day | ORAL | Status: DC
  Administered 2016-11-20: 81 mg via ORAL
  Filled 2016-11-20: qty 1

## 2016-11-20 MED ORDER — INSULIN ASPART 100 UNIT/ML ~~LOC~~ SOLN: SUBCUTANEOUS | 11 refills | Status: DC

## 2016-11-20 MED ORDER — INSULIN GLARGINE 100 UNIT/ML ~~LOC~~ SOLN: 10.0000 [IU] | Freq: Every day | SUBCUTANEOUS | 2 refills | Status: DC

## 2016-11-20 NOTE — Care Management Important Message (Signed)
Important Message  Patient Details  Name: [name] [name] MRN: [id number] Date of Birth: 22-May-1946   Medicare Important Message Given:  Yes    Trevon Strothers [place] 11/20/2016, 11:06 AM

## 2016-11-20 NOTE — Progress Notes (Signed)
NURSING PROGRESS NOTE  [name] [name] [id number] Discharge Data: 11/20/2016 6:11 PM Attending Provider: Jonetta Osgood, MD EUM:[id number], Belenda Cruise, [name]     Benjamine Sprague to be D/C'd to [facility] facility per MD order.  Discussed with the patient the After Visit Summary and all questions fully answered. All IV's discontinued with no bleeding noted. All belongings returned to patient. Report called to Derma.  Last Vital Signs:  Blood pressure (!) 151/93, pulse (!) 53, temperature 98 F (36.7 C), temperature source Oral, resp. rate 20, height 5\' 9"  (1.753 m), weight 68 kg (150 lb), SpO2 100 %.  Discharge Medication List Allergies as of 11/20/2016   No Known Allergies     Medication List    STOP taking these medications   HYDROcodone-acetaminophen 5-325 MG tablet Commonly known as:  NORCO/VICODIN   insulin aspart 100 UNIT/ML FlexPen Commonly known as:  NOVOLOG FLEXPEN Replaced by:  insulin aspart 100 UNIT/ML injection   ramipril 10 MG capsule Commonly known as:  ALTACE   rivaroxaban 20 MG Tabs tablet Commonly known as:  XARELTO     TAKE these medications   acetaminophen 325 MG tablet Commonly known as:  TYLENOL Take 2 tablets (650 mg total) by mouth every 6 (six) hours as needed for mild pain (or Fever >/= 101).   amLODipine 10 MG tablet Commonly known as:  NORVASC Take 1 tablet (10 mg total) by mouth daily.   aspirin 81 MG EC tablet Take 1 tablet (81 mg total) by mouth daily.   carvedilol 6.25 MG tablet Commonly known as:  COREG Take 1 tablet (6.25 mg total) by mouth 2 (two) times daily with a meal.   cyanocobalamin 1000 MCG/ML injection Commonly known as:  (VITAMIN B-12) Inject 1 mL (1,000 mcg total) into the muscle every 30 (thirty) days.   FLUoxetine 20 MG capsule Commonly known as:  PROZAC Take 20 mg by mouth daily.   insulin aspart 100 UNIT/ML injection Commonly known as:  novoLOG 0-15 Units, Subcutaneous, 3 times daily with meals CBG <  70: implement hypoglycemia protocol CBG 70 - 120: 0 units CBG 121 - 150: 2 units CBG 151 - 200: 3 units CBG 201 - 250: 5 units CBG 251 - 300: 8 units CBG 301 - 350: 11 units CBG 351 - 400: 15 units CBG > 400: call MD Replaces:  insulin aspart 100 UNIT/ML FlexPen   insulin glargine 100 UNIT/ML injection Commonly known as:  LANTUS Inject 0.1 mLs (10 Units total) into the skin daily.   senna 8.6 MG Tabs tablet Commonly known as:  SENOKOT Take 1 tablet (8.6 mg total) by mouth 2 (two) times daily.   thiamine 100 MG tablet Take 1 tablet (100 mg total) by mouth daily.

## 2016-11-20 NOTE — Care Management Note (Signed)
Case Management Note  Patient Details  Name: [name] [name] MRN: [id number] Date of Birth: 07/26/1946  Subjective/Objective:                    Action/Plan:  Patient to DC to SNF today as MD order as facilitated through [place].   Expected Discharge Date:  11/20/16               Expected Discharge Plan:  Skilled Nursing Facility  In-House Referral:  Clinical Social Work  Discharge planning Services  CM Consult  Post Acute Care Choice:    Choice offered to:     DME Arranged:    DME Agency:     HH Arranged:    [place] Agency:     Status of Service:  Completed, signed off  If discussed at [employer], dates discussed:    Additional Comments:  Carles Collet, RN 11/20/2016, 11:13 AM

## 2016-11-20 NOTE — Discharge Summary (Signed)
PATIENT DETAILS Name: [name] [name] Age: 71 y.o. Sex: male Date of Birth: 10/31/1945 MRN: [id number]. Admitting Physician: Elwin Mocha, MD URK:[id number], Belenda Cruise, [name]  Admit Date: 11/16/2016 Discharge date: 11/20/2016  Recommendations for Outpatient Follow-up:  1. Follow up with PCP in 1-2 weeks 2. Please obtain BMP/CBC/LFTs in one week 3. Consider outpatient GI consultation when patient more improved for elective colonoscopy  Admitted From:  Home  Disposition: SNF   Home Health: No  Equipment/Devices: None  Discharge Condition: Stable  CODE STATUS: FULL CODE  Diet recommendation:  Heart Healthy / Carb Modified   Brief Summary: See H&P, Labs, Consult and Test reports for all details in brief, Patient is a 71 y.o. male with chronic kidney disease stage III, uncontrolled type 2 diabetes due to noncompliance, hypertension, admitted on 2/4 with diabetic ketoacidosis, acute on chronic kidney disease. Admitted and started on IV insulin, DKA has resolved. Unfortunately patient is very deconditioned, and requires SNF placement. Please see below for further details.  Brief Hospital Course: DKA (diabetic ketoacidoses): Resolved, initially required IV insulin-now transitioned to subcutaneous insulin with stable CBGs. A1c 10.5.  Insulin-dependent type 2 diabetes: CBGs stable with 10 units of Lantus and SSI. Follow and adjust accordingly while at SNF  Anemia: Suspect at baseline has anemia of chronic kidney disease, likely worsened due to acute illness. Although has Hemoccult-positive stools, no overt evidence of GI loss. Anemia panel with elevated ferritin-consistent with chronic disease. Currently seems to be asymptomatic, plans are to continue to monitor closely while at [facility]. Can pursue outpatient colonoscopy when less deconditioned and improved.  Elevated LFTs: Suspect secondary to shock liver/acute illness. No RUQ tenderness on exam. LFTs slowly down trending, Although  downtrending seems to have plateaued. Check acute hepatitis serology, acute hepatitis serology negative, RUQ ultrasound does not show any acute abnormalities. Please continue to avoid hepatotoxic agents, and follow LFTs-suggest repeating LFTs in 1 week.  History of CVA: Nonfocal exam-continue to hold statin due to elevated LFTs-since no overt GI bleeding, will go ahead and resume low-dose aspirin.   Hypertension: Controlled with Coreg, resume ramipril and amlodipine or the next few days.  Dyslipidemia: Continue to hold statin due to elevated LFTs. Resume when able.  History of DVT left lower extremity: Recent Doppler on 10/02/16 and negative for DVT. Seems to have already completed > 3 months of treatment. Given Anemia, significant deconditioning with increased fall risk-suspect we can discontinue anticoagulation at this time. Spoke with patient's daughter over the phone, explained recent negative Doppler and the fact that patient probably has completed close to 5 months of treatment-now with significant deconditioning is at fall risk. Family agreeable to discontinue anticoagulation.  Generalized weakness/physical deconditioning: Recent multiple hospitalizations-elderly-now subsequently very weak following acute illness. Agreeable for SNF  History of noncompliance to medications and follow-up: Counseled. 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  Note-spoke with patient's daughter over the phone on 2/8  Procedures/Studies: None  Discharge Diagnoses:  Principal Problem:   DKA (diabetic ketoacidoses) ([place]) Active Problems:   Benign essential HTN   Anxiety   History of CVA (cerebrovascular accident)   Noncompliance with medication regimen   Acute renal failure superimposed on stage 3 chronic kidney disease ([name])   Generalized weakness   Discharge Instructions:  Activity:  As tolerated with Full fall precautions use walker/cane & assistance as needed   Discharge  Instructions    Diet - low sodium heart healthy    Complete by:  As directed    Diet Carb  Modified    Complete by:  As directed    Discharge instructions    Complete by:  As directed    Follow with Primary MD  Mauricio Po, [name] in 1 week  Please ask your attending M.D. at SNF to check a CBC and complete metabolic panel in 1 week  Please get a complete blood count and chemistry panel checked by your Primary MD at your next visit, and again as instructed by your Primary MD.  Get Medicines reviewed and adjusted: Please take all your medications with you for your next visit with your Primary MD  Laboratory/radiological data: Please request your Primary MD to go over all hospital tests and procedure/radiological results at the follow up, please ask your Primary MD to get all Hospital records sent to his/her office.  In some cases, they will be blood work, cultures and biopsy results pending at the time of your discharge. Please request that your primary care M.D. follows up on these results.  Also Note the following: If you experience worsening of your admission symptoms, develop shortness of breath, life threatening emergency, suicidal or homicidal thoughts you must seek medical attention immediately by calling 911 or calling your MD immediately  if symptoms less severe.  You must read complete instructions/literature along with all the possible adverse reactions/side effects for all the Medicines you take and that have been prescribed to you. Take any new Medicines after you have completely understood and accpet all the possible adverse reactions/side effects.   Do not drive when taking Pain medications or sleeping medications (Benzodaizepines)  Do not take more than prescribed Pain, Sleep and Anxiety Medications. It is not advisable to combine anxiety,sleep and pain medications without talking with your primary care practitioner  Special Instructions: If you have smoked or chewed  Tobacco  in the last 2 yrs please stop smoking, stop any regular Alcohol  and or any Recreational drug use.  Wear Seat belts while driving.  Please note: You were cared for by a hospitalist during your hospital stay. Once you are discharged, your primary care physician will handle any further medical issues. Please note that NO REFILLS for any discharge medications will be authorized once you are discharged, as it is imperative that you return to your primary care physician (or establish a relationship with a primary care physician if you do not have one) for your post hospital discharge needs so that they can reassess your need for medications and monitor your lab values.   Increase activity slowly    Complete by:  As directed      Allergies as of 11/20/2016   No Known Allergies     Medication List    STOP taking these medications   HYDROcodone-acetaminophen 5-325 MG tablet Commonly known as:  NORCO/VICODIN   insulin aspart 100 UNIT/ML FlexPen Commonly known as:  NOVOLOG FLEXPEN Replaced by:  insulin aspart 100 UNIT/ML injection   ramipril 10 MG capsule Commonly known as:  ALTACE   rivaroxaban 20 MG Tabs tablet Commonly known as:  XARELTO     TAKE these medications   acetaminophen 325 MG tablet Commonly known as:  TYLENOL Take 2 tablets (650 mg total) by mouth every 6 (six) hours as needed for mild pain (or Fever >/= 101).   amLODipine 10 MG tablet Commonly known as:  NORVASC Take 1 tablet (10 mg total) by mouth daily.   aspirin 81 MG EC tablet Take 1 tablet (81 mg total) by mouth daily.   carvedilol  6.25 MG tablet Commonly known as:  COREG Take 1 tablet (6.25 mg total) by mouth 2 (two) times daily with a meal.   cyanocobalamin 1000 MCG/ML injection Commonly known as:  (VITAMIN B-12) Inject 1 mL (1,000 mcg total) into the muscle every 30 (thirty) days.   FLUoxetine 20 MG capsule Commonly known as:  PROZAC Take 20 mg by mouth daily.   insulin aspart 100 UNIT/ML  injection Commonly known as:  novoLOG 0-15 Units, Subcutaneous, 3 times daily with meals CBG < 70: implement hypoglycemia protocol CBG 70 - 120: 0 units CBG 121 - 150: 2 units CBG 151 - 200: 3 units CBG 201 - 250: 5 units CBG 251 - 300: 8 units CBG 301 - 350: 11 units CBG 351 - 400: 15 units CBG > 400: call MD Replaces:  insulin aspart 100 UNIT/ML FlexPen   insulin glargine 100 UNIT/ML injection Commonly known as:  LANTUS Inject 0.1 mLs (10 Units total) into the skin daily.   senna 8.6 MG Tabs tablet Commonly known as:  SENOKOT Take 1 tablet (8.6 mg total) by mouth 2 (two) times daily.   thiamine 100 MG tablet Take 1 tablet (100 mg total) by mouth daily.      Follow-up Information    Mauricio Po, [name]. Schedule an appointment as soon as possible for a visit in 1 week(s).   Specialty:  Family Medicine Contact information: [address] [place] [address] [phone]          No Known Allergies  Consultations:   None  Other Procedures/Studies: Dg Chest Port 1 View  Result Date: 11/16/2016 CLINICAL DATA:  Generalized weakness and rapid respiratory rate. EXAM: PORTABLE CHEST 1 VIEW COMPARISON:  10/02/2016 FINDINGS: The cardiac silhouette, mediastinal and hilar contours are within normal limits and stable. There is mild tortuosity of the thoracic aorta. The lungs are clear. No pleural effusion. The bony thorax is intact. IMPRESSION: No acute cardiopulmonary findings. Electronically Signed   By: Marijo Sanes M.D.   On: 11/16/2016 10:47   US Abdomen Limited Ruq  Result Date: 11/20/2016 CLINICAL DATA:  Elevated LFTs, hypertension, diabetes mellitus EXAM: US ABDOMEN LIMITED - RIGHT UPPER QUADRANT COMPARISON:  CT abdomen pelvis 07/06/2016 FINDINGS: Gallbladder: Incompletely distended with borderline wall thickening; patient reportedly NPO since 2300 hours last night. No gallstones, definite pericholecystic fluid or sonographic Murphy sign. Common bile duct: Diameter: 5 mm  diameter, normal Liver: Echogenic, likely fatty infiltration, though this can be seen with cirrhosis and certain infiltrative disorders. No focal hepatic mass or nodularity. Hepatopetal portal venous flow. Trace RIGHT upper quadrant ascites in perihepatic region. IMPRESSION: Incompletely distended gallbladder with borderline gallbladder wall thickening, nonspecific. Probable fatty infiltration of liver. Trace ascites. Electronically Signed   By: Lavonia Dana M.D.   On: 11/20/2016 08:09     TODAY-DAY OF DISCHARGE:  Subjective:   [name] [name] today has no headache,no chest abdominal pain,no new weakness tingling or numbness, feels much better wants to go home today.   Objective:   Blood pressure (!) 151/93, pulse (!) 53, temperature 98 F (36.7 C), temperature source Oral, resp. rate 20, height 5\' 9"  (1.753 m), weight 68 kg (150 lb), SpO2 100 %.  Intake/Output Summary (Last 24 hours) at 11/20/16 1105 Last data filed at 11/20/16 0502  Gross per 24 hour  Intake           1832.5 ml  Output              850 ml  Net            982.5 ml   Filed Weights   11/16/16 0927 11/16/16 1743  Weight: 68 kg (150 lb) 68 kg (150 lb)    Exam: Awake Alert, Oriented *3, No new F.N deficits, Normal affect [place].AT,PERRAL Supple Neck,No JVD, No cervical lymphadenopathy appriciated.  Symmetrical Chest wall movement, Good air movement bilaterally, CTAB RRR,No Gallops,Rubs or new Murmurs, No Parasternal Heave +ve B.Sounds, Abd Soft, Non tender, No organomegaly appriciated, No rebound -guarding or rigidity. No Cyanosis, Clubbing or edema, No new Rash or bruise   PERTINENT RADIOLOGIC STUDIES: Dg Chest Port 1 View  Result Date: 11/16/2016 CLINICAL DATA:  Generalized weakness and rapid respiratory rate. EXAM: PORTABLE CHEST 1 VIEW COMPARISON:  10/02/2016 FINDINGS: The cardiac silhouette, mediastinal and hilar contours are within normal limits and stable. There is mild tortuosity of the thoracic aorta. The lungs  are clear. No pleural effusion. The bony thorax is intact. IMPRESSION: No acute cardiopulmonary findings. Electronically Signed   By: Marijo Sanes M.D.   On: 11/16/2016 10:47   US Abdomen Limited Ruq  Result Date: 11/20/2016 CLINICAL DATA:  Elevated LFTs, hypertension, diabetes mellitus EXAM: US ABDOMEN LIMITED - RIGHT UPPER QUADRANT COMPARISON:  CT abdomen pelvis 07/06/2016 FINDINGS: Gallbladder: Incompletely distended with borderline wall thickening; patient reportedly NPO since 2300 hours last night. No gallstones, definite pericholecystic fluid or sonographic Murphy sign. Common bile duct: Diameter: 5 mm diameter, normal Liver: Echogenic, likely fatty infiltration, though this can be seen with cirrhosis and certain infiltrative disorders. No focal hepatic mass or nodularity. Hepatopetal portal venous flow. Trace RIGHT upper quadrant ascites in perihepatic region. IMPRESSION: Incompletely distended gallbladder with borderline gallbladder wall thickening, nonspecific. Probable fatty infiltration of liver. Trace ascites. Electronically Signed   By: Lavonia Dana M.D.   On: 11/20/2016 08:09     PERTINENT LAB RESULTS: CBC:  Recent Labs  11/19/16 0219 11/20/16 0429  WBC 4.3 4.3  HGB 7.5* 7.4*  HCT 22.1* 21.8*  PLT MACEDA, J. SAMPLE WILL BE CREDITED AND RECOLLECTED. 147*   CMET CMP     Component Value Date/Time   NA 138 11/20/2016 0429   K 3.4 (L) 11/20/2016 0429   CL 99 (L) 11/20/2016 0429   CO2 29 11/20/2016 0429   GLUCOSE 62 (L) 11/20/2016 0429   BUN 21 (H) 11/20/2016 0429   CREATININE 1.36 (H) 11/20/2016 0429   CALCIUM 6.5 (L) 11/20/2016 0429   PROT 4.0 (L) 11/20/2016 0429   ALBUMIN 1.8 (L) 11/20/2016 0429   AST 211 (H) 11/20/2016 0429   ALT 102 (H) 11/20/2016 0429   ALKPHOS 184 (H) 11/20/2016 0429   BILITOT 0.4 11/20/2016 0429   GFRNONAA 51 (L) 11/20/2016 0429   GFRAA 59 (L) 11/20/2016 0429    GFR Estimated Creatinine Clearance: 47.9 mL/min (by C-G formula based on SCr of  1.36 mg/dL (H)). No results for input(s): LIPASE, AMYLASE in the last 72 hours. No results for input(s): CKTOTAL, CKMB, CKMBINDEX, TROPONINI in the last 72 hours. Invalid input(s): POCBNP No results for input(s): DDIMER in the last 72 hours.  Recent Labs  11/19/16 0837  HGBA1C 10.5*   No results for input(s): CHOL, HDL, LDLCALC, TRIG, CHOLHDL, LDLDIRECT in the last 72 hours. No results for input(s): TSH, T4TOTAL, T3FREE, THYROIDAB in the last 72 hours.  Invalid input(s): FREET3  Recent Labs  11/19/16 1011  VITAMINB12 609  FOLATE 10.1  FERRITIN 408*  TIBC 181*  IRON 57  RETICCTPCT 0.6   Coags: No  results for input(s): INR in the last 72 hours.  Invalid input(s): PT Microbiology: Recent Results (from the past 240 hour(s))  MRSA PCR Screening     Status: None   Collection Time: 11/16/16  5:54 PM  Result Value Ref Range Status   MRSA by PCR NEGATIVE NEGATIVE Final    Comment:        The GeneXpert MRSA Assay (FDA approved for NASAL specimens only), is one component of a comprehensive MRSA colonization surveillance program. It is not intended to diagnose MRSA infection nor to guide or monitor treatment for MRSA infections.     FURTHER DISCHARGE INSTRUCTIONS:  Get Medicines reviewed and adjusted: Please take all your medications with you for your next visit with your Primary MD  Laboratory/radiological data: Please request your Primary MD to go over all hospital tests and procedure/radiological results at the follow up, please ask your Primary MD to get all Hospital records sent to his/her office.  In some cases, they will be blood work, cultures and biopsy results pending at the time of your discharge. Please request that your primary care M.D. goes through all the records of your hospital data and follows up on these results.  Also Note the following: If you experience worsening of your admission symptoms, develop shortness of breath, life threatening emergency,  suicidal or homicidal thoughts you must seek medical attention immediately by calling 911 or calling your MD immediately  if symptoms less severe.  You must read complete instructions/literature along with all the possible adverse reactions/side effects for all the Medicines you take and that have been prescribed to you. Take any new Medicines after you have completely understood and accpet all the possible adverse reactions/side effects.   Do not drive when taking Pain medications or sleeping medications (Benzodaizepines)  Do not take more than prescribed Pain, Sleep and Anxiety Medications. It is not advisable to combine anxiety,sleep and pain medications without talking with your primary care practitioner  Special Instructions: If you have smoked or chewed Tobacco  in the last 2 yrs please stop smoking, stop any regular Alcohol  and or any Recreational drug use.  Wear Seat belts while driving.  Please note: You were cared for by a hospitalist during your hospital stay. Once you are discharged, your primary care physician will handle any further medical issues. Please note that NO REFILLS for any discharge medications will be authorized once you are discharged, as it is imperative that you return to your primary care physician (or establish a relationship with a primary care physician if you do not have one) for your post hospital discharge needs so that they can reassess your need for medications and monitor your lab values.  Total Time spent coordinating discharge including counseling, education and face to face time equals  45 minutes.  SignedOren Binet 11/20/2016 11:05 AM

## 2016-11-21 NOTE — Clinical Social Work Placement (Signed)
   CLINICAL SOCIAL WORK PLACEMENT  NOTE  Date:  11/21/2016  Patient Details  Name: [name] [name] MRN: [id number] Date of Birth: January 26, 1946  Clinical Social Work is seeking post-discharge placement for this patient at the [place] level of care (*CSW will initial, date and re-position this form in  chart as items are completed):  Yes   Patient/family provided with [place] Work Department's list of facilities offering this level of care within the geographic area requested by the patient (or if unable, by the patient's family).  Yes   Patient/family informed of their freedom to choose among providers that offer the needed level of care, that participate in Medicare, Medicaid or managed care program needed by the patient, have an available bed and are willing to accept the patient.  Yes   Patient/family informed of [facility]'s ownership interest in [facility] and [facility], as well as of the fact that they are under no obligation to receive care at these facilities.  PASRR submitted to EDS on       PASRR number received on       Existing PASRR number confirmed on 11/18/16     FL2 transmitted to all facilities in geographic area requested by pt/family on 11/18/16     FL2 transmitted to all facilities within larger geographic area on       Patient informed that his/her managed care company has contracts with or will negotiate with certain facilities, including the following:        Yes   Patient/family informed of bed offers received.  Patient chooses bed at [facility]     Physician recommends and patient chooses bed at      Patient to be transferred to [facility] on 11/20/16.  Patient to be transferred to facility by [facility]     Patient family notified on 11/20/16 of transfer.  Name of family member notified:  N/A     PHYSICIAN       Additional Comment:    _______________________________________________ Benard Halsted,  [place] 11/21/2016, 4:12 PM

## 2016-11-21 NOTE — Progress Notes (Signed)
Patient will DC to: [facility] Anticipated DC date: 11/20/16 Family notified: N/A Transport by: Corey Harold   Per MD patient ready for DC to [facility]. RN, patient, patient's family, and facility notified of DC. Discharge Summary sent to facility. RN given number for report. DC packet on chart. Ambulance transport requested for patient.   CSW signing off.  Cedric Fishman, [place] Social Worker [phone]

## 2016-12-01 IMAGING — CT CT HEAD W/O CM
3 series · 15 of 30 positions shown, 17 images · non-contrast
Comparison: 08/07/2015

CLINICAL DATA: High blood pressure. Dizziness. Confusion. Diabetes.
Prior stroke.

EXAM:
CT HEAD WITHOUT CONTRAST
TECHNIQUE: Contiguous axial images were obtained from the base of the skull
through the vertex without intravenous contrast.

[Series 2: head w/o · axial · non-contrast · 0.42mm/px · z∈[-97,+8]mm · 6 of 31 slices shown, 8 images (1 of 2)]
[im 5/31  brain]
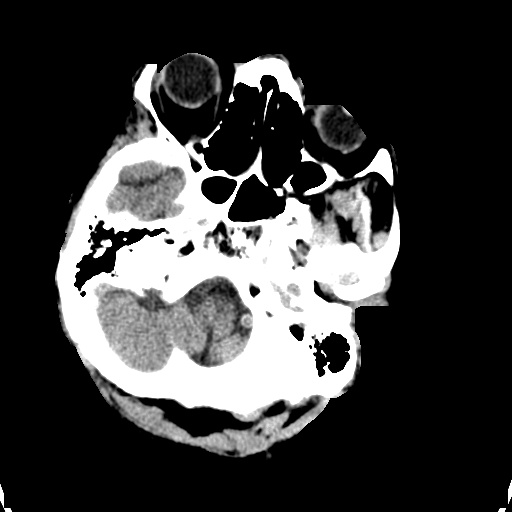
[im 5/31  bone]
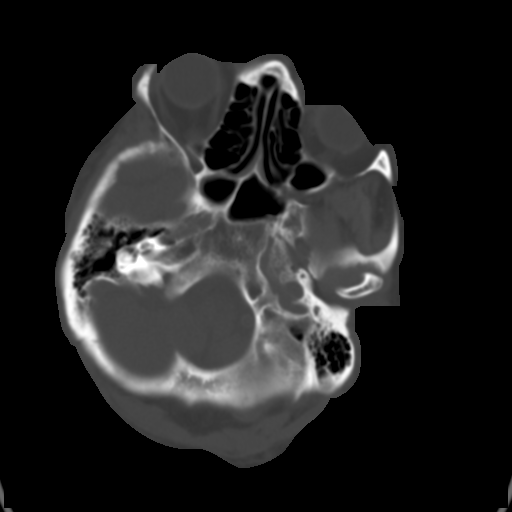
[im 9/31  brain]
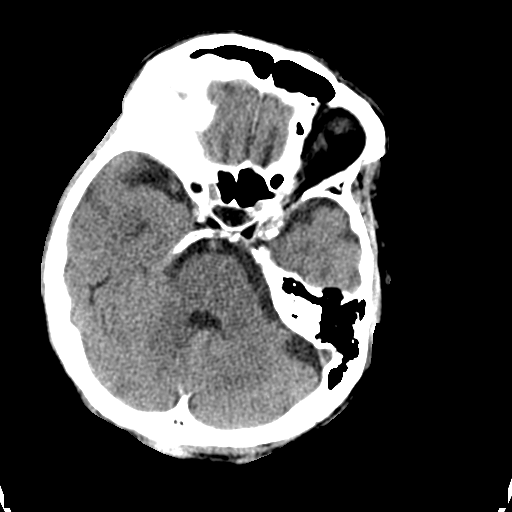
[im 13/31  brain]
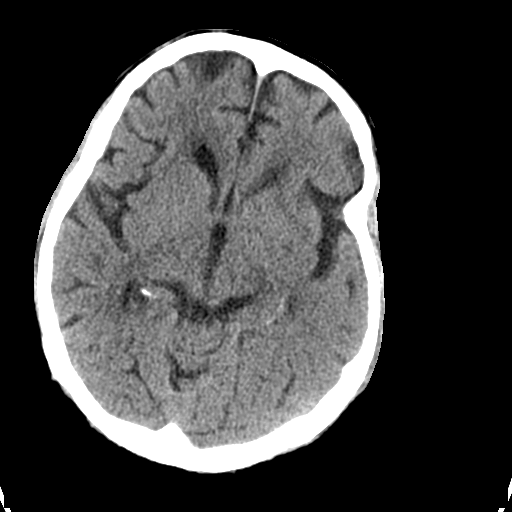
[im 18/31  brain]
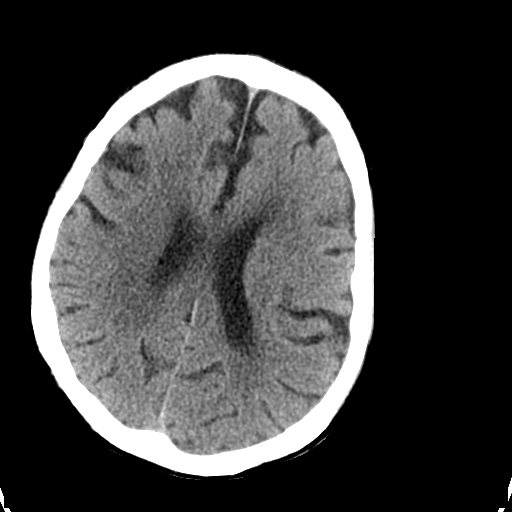
[im 22/31  brain]
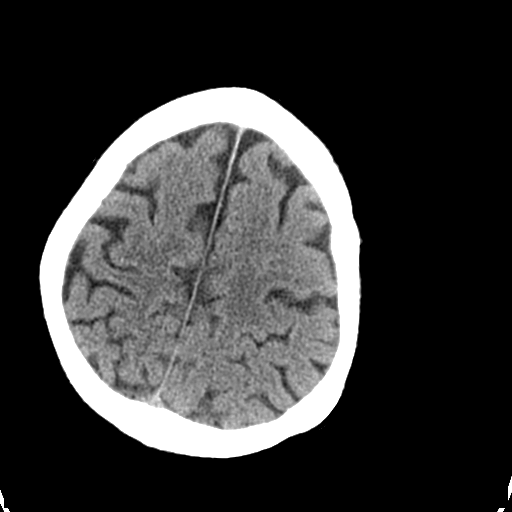
[im 22/31  bone]
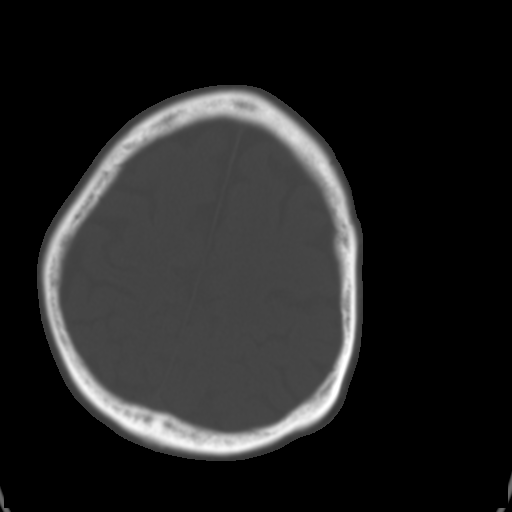
[im 26/31  brain]
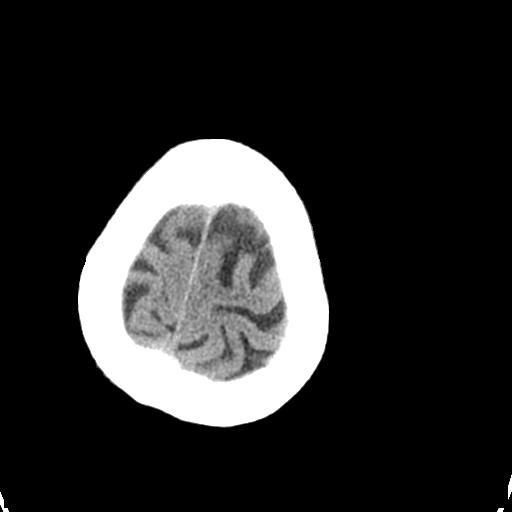

[Series 3: bone windows · axial · 0.42mm/px · z∈[-108,-72]mm · 3 of 51 slices shown]
[im 4/51  bone]
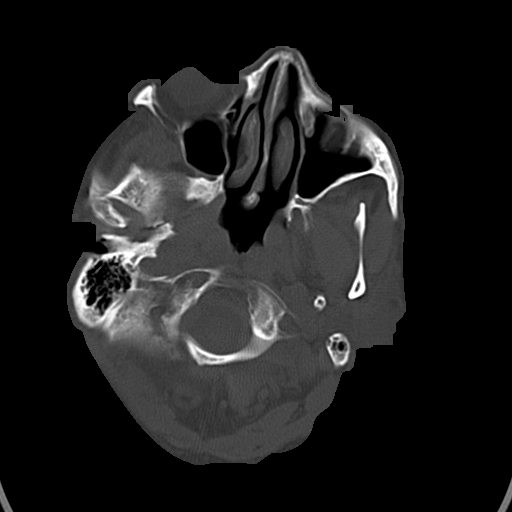
[im 12/51  bone]
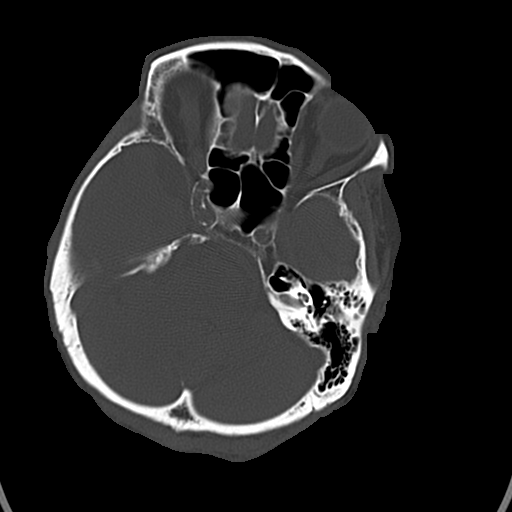
[im 16/51  bone]
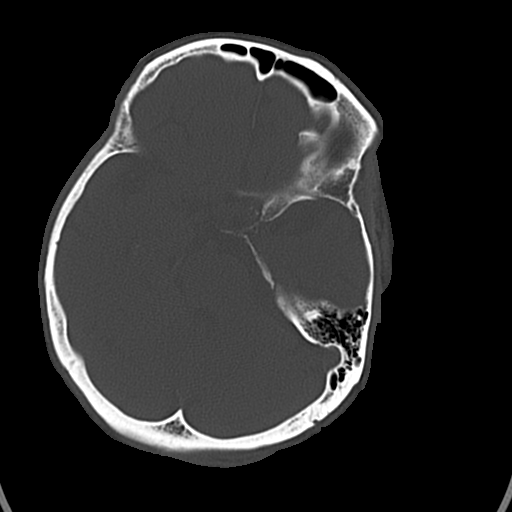

[Series 4: head w/o · axial · non-contrast · 0.30mm/px · z∈[-75,+20]mm · 6 of 28 slices shown (2 of 2)]
[im 4/28  brain]
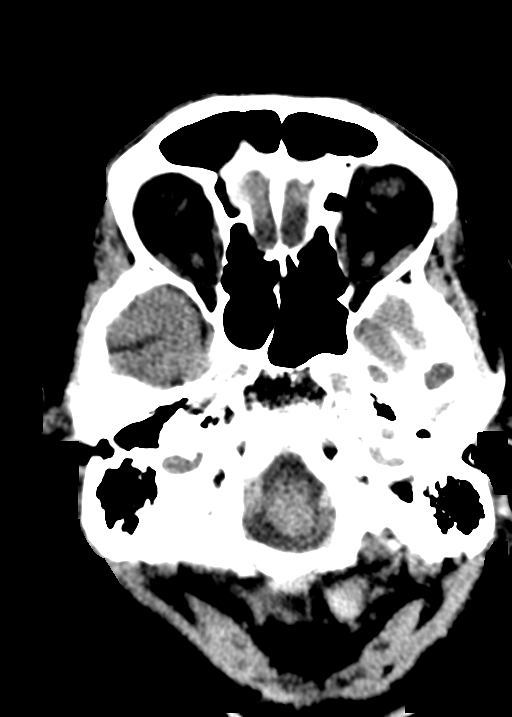
[im 8/28  brain]
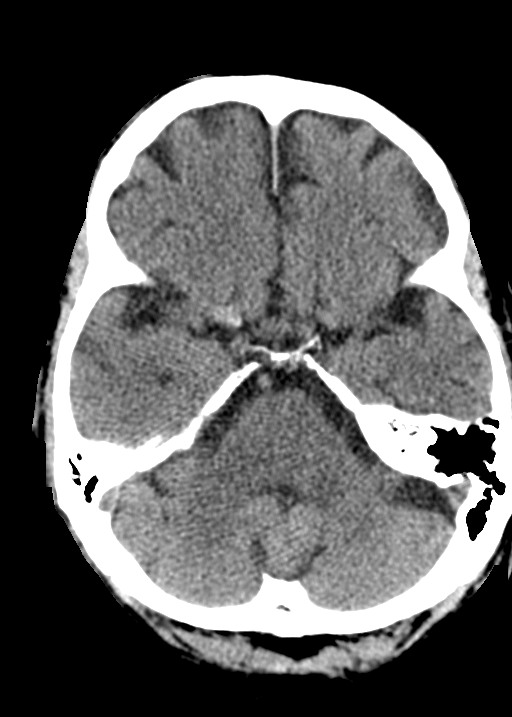
[im 12/28  brain]
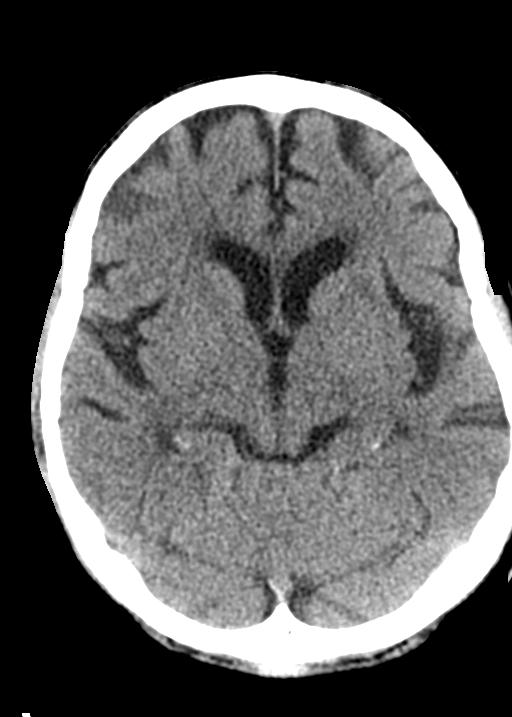
[im 16/28  brain]
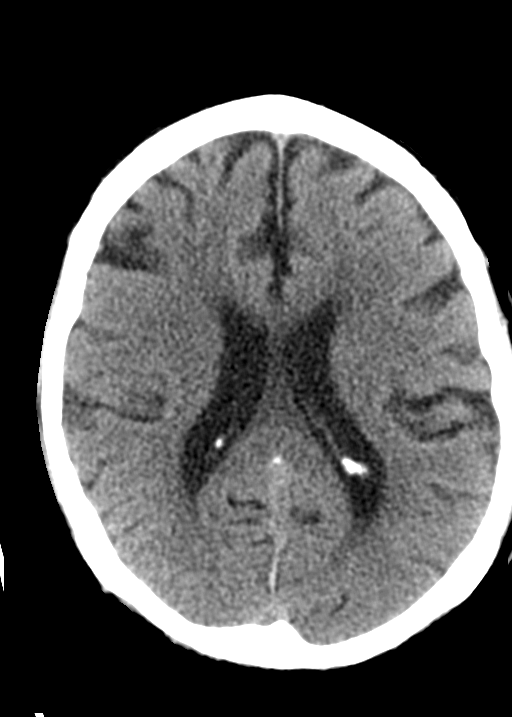
[im 20/28  brain]
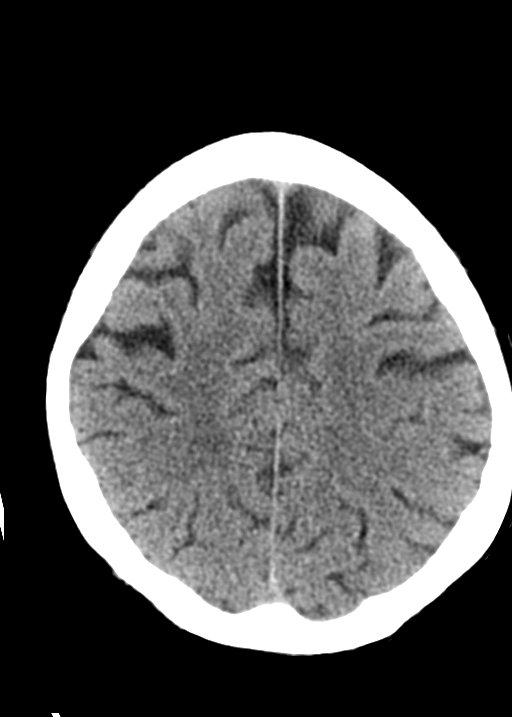
[im 24/28  brain]
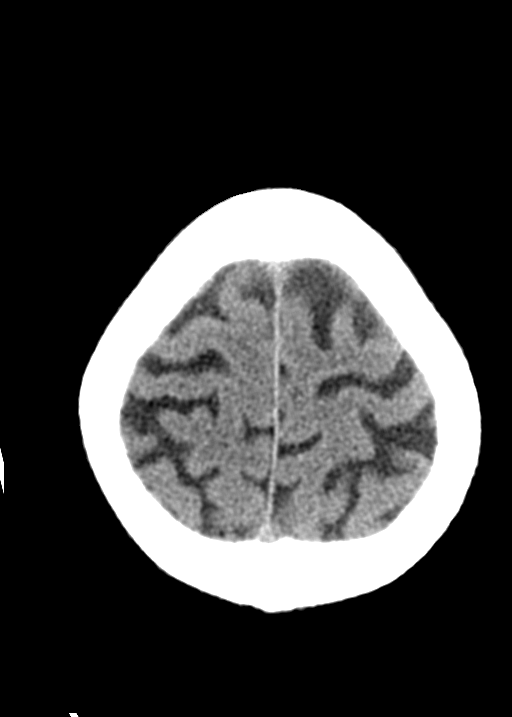

[15 of 30 positions shown; findings below may reference images not displayed]

FINDINGS: Sinuses/Soft tissues: Mucosal thickening of the right sphenoid
sinus. Cerumen in both external ear canals. Clear mastoid air cells.

Intracranial: Mild to moderate low density in the periventricular
white matter likely related to small vessel disease. No mass lesion,
hemorrhage, hydrocephalus, acute infarct, intra-axial, or
extra-axial fluid collection.
IMPRESSION: 1.  No acute intracranial abnormality.
2. Small vessel ischemic change in the periventricular white matter.
3. Minimal sinus disease.

## 2016-12-11 NOTE — Telephone Encounter (Signed)
Certified dismissal letter returned as undeliverable, unclaimed, return to sender after three attempts by [employer] on December 11, 2016. Letter placed in another envelope and resent as 1st class mail which does not require a signature. DAJ

## 2016-12-13 ENCOUNTER — Emergency Department (HOSPITAL_COMMUNITY): Payer: Medicare HMO

## 2016-12-13 ENCOUNTER — Inpatient Hospital Stay (HOSPITAL_COMMUNITY)
Admission: EM | Admit: 2016-12-13 | Discharge: 2016-12-17 | DRG: 917 | Disposition: A | Discharge status: Home Health | Payer: Medicare HMO | Attending: Family Medicine | Admitting: Family Medicine

## 2016-12-13 ENCOUNTER — Encounter (HOSPITAL_COMMUNITY): Payer: Self-pay | Admitting: Emergency Medicine

## 2016-12-13 DIAGNOSIS — E15 Nondiabetic hypoglycemic coma: Secondary | ICD-10-CM | POA: Diagnosis not present

## 2016-12-13 DIAGNOSIS — Y92039 Unspecified place in apartment as the place of occurrence of the external cause: Secondary | ICD-10-CM | POA: Diagnosis not present

## 2016-12-13 DIAGNOSIS — Z9119 Patient's noncompliance with other medical treatment and regimen: Secondary | ICD-10-CM

## 2016-12-13 DIAGNOSIS — T383X1A Poisoning by insulin and oral hypoglycemic [antidiabetic] drugs, accidental (unintentional), initial encounter: Principal | ICD-10-CM | POA: Diagnosis present

## 2016-12-13 DIAGNOSIS — Z9114 Patient's other noncompliance with medication regimen: Secondary | ICD-10-CM

## 2016-12-13 DIAGNOSIS — I129 Hypertensive chronic kidney disease with stage 1 through stage 4 chronic kidney disease, or unspecified chronic kidney disease: Secondary | ICD-10-CM | POA: Diagnosis present

## 2016-12-13 DIAGNOSIS — I493 Ventricular premature depolarization: Secondary | ICD-10-CM | POA: Diagnosis present

## 2016-12-13 DIAGNOSIS — E872 Acidosis: Secondary | ICD-10-CM | POA: Diagnosis present

## 2016-12-13 DIAGNOSIS — F329 Major depressive disorder, single episode, unspecified: Secondary | ICD-10-CM | POA: Diagnosis present

## 2016-12-13 DIAGNOSIS — E1122 Type 2 diabetes mellitus with diabetic chronic kidney disease: Secondary | ICD-10-CM | POA: Diagnosis present

## 2016-12-13 DIAGNOSIS — G9341 Metabolic encephalopathy: Secondary | ICD-10-CM | POA: Diagnosis not present

## 2016-12-13 DIAGNOSIS — Z96641 Presence of right artificial hip joint: Secondary | ICD-10-CM | POA: Diagnosis present

## 2016-12-13 DIAGNOSIS — E1165 Type 2 diabetes mellitus with hyperglycemia: Secondary | ICD-10-CM | POA: Diagnosis present

## 2016-12-13 DIAGNOSIS — E162 Hypoglycemia, unspecified: Secondary | ICD-10-CM

## 2016-12-13 DIAGNOSIS — N179 Acute kidney failure, unspecified: Secondary | ICD-10-CM | POA: Diagnosis present

## 2016-12-13 DIAGNOSIS — A419 Sepsis, unspecified organism: Secondary | ICD-10-CM | POA: Diagnosis present

## 2016-12-13 DIAGNOSIS — F419 Anxiety disorder, unspecified: Secondary | ICD-10-CM | POA: Diagnosis present

## 2016-12-13 DIAGNOSIS — F1120 Opioid dependence, uncomplicated: Secondary | ICD-10-CM | POA: Diagnosis not present

## 2016-12-13 DIAGNOSIS — N183 Chronic kidney disease, stage 3 unspecified: Secondary | ICD-10-CM | POA: Diagnosis present

## 2016-12-13 DIAGNOSIS — E11641 Type 2 diabetes mellitus with hypoglycemia with coma: Secondary | ICD-10-CM | POA: Diagnosis present

## 2016-12-13 DIAGNOSIS — E1121 Type 2 diabetes mellitus with diabetic nephropathy: Secondary | ICD-10-CM | POA: Diagnosis present

## 2016-12-13 DIAGNOSIS — Z86718 Personal history of other venous thrombosis and embolism: Secondary | ICD-10-CM

## 2016-12-13 DIAGNOSIS — Z794 Long term (current) use of insulin: Secondary | ICD-10-CM

## 2016-12-13 DIAGNOSIS — E875 Hyperkalemia: Secondary | ICD-10-CM | POA: Diagnosis present

## 2016-12-13 DIAGNOSIS — I959 Hypotension, unspecified: Secondary | ICD-10-CM | POA: Diagnosis present

## 2016-12-13 DIAGNOSIS — E860 Dehydration: Secondary | ICD-10-CM | POA: Diagnosis present

## 2016-12-13 DIAGNOSIS — N185 Chronic kidney disease, stage 5: Secondary | ICD-10-CM | POA: Diagnosis present

## 2016-12-13 DIAGNOSIS — R680 Hypothermia, not associated with low environmental temperature: Secondary | ICD-10-CM | POA: Diagnosis present

## 2016-12-13 DIAGNOSIS — Z833 Family history of diabetes mellitus: Secondary | ICD-10-CM

## 2016-12-13 DIAGNOSIS — Z820 Family history of epilepsy and other diseases of the nervous system: Secondary | ICD-10-CM

## 2016-12-13 DIAGNOSIS — D649 Anemia, unspecified: Secondary | ICD-10-CM | POA: Diagnosis present

## 2016-12-13 DIAGNOSIS — F32A Depression, unspecified: Secondary | ICD-10-CM | POA: Diagnosis present

## 2016-12-13 DIAGNOSIS — R400 Somnolence: Secondary | ICD-10-CM | POA: Diagnosis present

## 2016-12-13 DIAGNOSIS — Z8673 Personal history of transient ischemic attack (TIA), and cerebral infarction without residual deficits: Secondary | ICD-10-CM

## 2016-12-13 DIAGNOSIS — IMO0002 Reserved for concepts with insufficient information to code with codable children: Secondary | ICD-10-CM

## 2016-12-13 DIAGNOSIS — Z8249 Family history of ischemic heart disease and other diseases of the circulatory system: Secondary | ICD-10-CM

## 2016-12-13 LAB — URINALYSIS, ROUTINE W REFLEX MICROSCOPIC
Bilirubin Urine: NEGATIVE
Glucose, UA: >=500 mg/dL — AB
Hgb urine dipstick: NEGATIVE
Ketones, ur: NEGATIVE mg/dL
Leukocytes, UA: NEGATIVE
Nitrite: NEGATIVE
Protein, ur: 30 mg/dL — AB
Specific Gravity, Urine: 1.013 (ref 1.005–1.030)
Squamous Epithelial / LPF: NONE SEEN
pH: 5 (ref 5.0–8.0)

## 2016-12-13 LAB — RAPID URINE DRUG SCREEN, HOSP PERFORMED
Amphetamines: NOT DETECTED
Barbiturates: NOT DETECTED
Benzodiazepines: NOT DETECTED
Cocaine: NOT DETECTED
Opiates: POSITIVE — AB
Tetrahydrocannabinol: NOT DETECTED

## 2016-12-13 LAB — I-STAT VENOUS BLOOD GAS, ED
Acid-base deficit: 6 mmol/L — ABNORMAL HIGH (ref 0.0–2.0)
Bicarbonate: 20.9 mmol/L (ref 20.0–28.0)
O2 Saturation: 77 %
TCO2: 22 mmol/L (ref 0–100)
pCO2, Ven: 44.8 mmHg (ref 44.0–60.0)
pH, Ven: 7.278 (ref 7.250–7.430)
pO2, Ven: 47 mmHg — ABNORMAL HIGH (ref 32.0–45.0)

## 2016-12-13 LAB — AMMONIA: Ammonia: 40 umol/L — ABNORMAL HIGH (ref 9–35)

## 2016-12-13 LAB — CBC WITH DIFFERENTIAL/PLATELET
Basophils Absolute: 0 10*3/uL (ref 0.0–0.1)
Basophils Relative: 0 %
Eosinophils Absolute: 0.3 10*3/uL (ref 0.0–0.7)
Eosinophils Relative: 2 %
HCT: 26.2 % — ABNORMAL LOW (ref 39.0–52.0)
Hemoglobin: 8.6 g/dL — ABNORMAL LOW (ref 13.0–17.0)
Lymphocytes Relative: 19 %
Lymphs Abs: 2.1 10*3/uL (ref 0.7–4.0)
MCH: 31.3 pg (ref 26.0–34.0)
MCHC: 32.8 g/dL (ref 30.0–36.0)
MCV: 95.3 fL (ref 78.0–100.0)
Monocytes Absolute: 1.4 10*3/uL — ABNORMAL HIGH (ref 0.1–1.0)
Monocytes Relative: 13 %
Neutro Abs: 7.5 10*3/uL (ref 1.7–7.7)
Neutrophils Relative %: 66 %
Platelets: 339 10*3/uL (ref 150–400)
RBC: 2.75 MIL/uL — ABNORMAL LOW (ref 4.22–5.81)
RDW: 15.7 % — ABNORMAL HIGH (ref 11.5–15.5)
WBC: 11.2 10*3/uL — ABNORMAL HIGH (ref 4.0–10.5)

## 2016-12-13 LAB — I-STAT TROPONIN, ED: Troponin i, poc: 0 ng/mL (ref 0.00–0.08)

## 2016-12-13 LAB — CBG MONITORING, ED
Glucose-Capillary: 176 mg/dL — ABNORMAL HIGH (ref 65–99)
Glucose-Capillary: 265 mg/dL — ABNORMAL HIGH (ref 65–99)
Glucose-Capillary: 51 mg/dL — ABNORMAL LOW (ref 65–99)
Glucose-Capillary: 122 mg/dL — ABNORMAL HIGH (ref 65–99)
Glucose-Capillary: 151 mg/dL — ABNORMAL HIGH (ref 65–99)

## 2016-12-13 LAB — I-STAT CHEM 8, ED
BUN: 39 mg/dL — ABNORMAL HIGH (ref 6–20)
Calcium, Ion: 1.06 mmol/L — ABNORMAL LOW (ref 1.15–1.40)
Chloride: 106 mmol/L (ref 101–111)
Creatinine, Ser: 1.9 mg/dL — ABNORMAL HIGH (ref 0.61–1.24)
Glucose, Bld: 312 mg/dL — ABNORMAL HIGH (ref 65–99)
HCT: 27 % — ABNORMAL LOW (ref 39.0–52.0)
Hemoglobin: 9.2 g/dL — ABNORMAL LOW (ref 13.0–17.0)
Potassium: 5 mmol/L (ref 3.5–5.1)
Sodium: 136 mmol/L (ref 135–145)
TCO2: 21 mmol/L (ref 0–100)

## 2016-12-13 LAB — I-STAT CG4 LACTIC ACID, ED: Lactic Acid, Venous: 2.36 mmol/L (ref 0.5–1.9)

## 2016-12-13 LAB — CK: Total CK: 51 U/L (ref 49–397)

## 2016-12-13 MED ORDER — DEXTROSE 10 % IV SOLN
INTRAVENOUS | Status: DC
  Administered 2016-12-13: 22:00:00 via INTRAVENOUS

## 2016-12-13 MED ORDER — GLUCAGON HCL RDNA (DIAGNOSTIC) 1 MG IJ SOLR
1.0000 mg | Freq: Once | INTRAMUSCULAR | Status: DC
  Filled 2016-12-13: qty 1

## 2016-12-13 MED ORDER — VANCOMYCIN HCL IN DEXTROSE 1-5 GM/200ML-% IV SOLN
1000.0000 mg | Freq: Once | INTRAVENOUS | Status: AC
  Administered 2016-12-13: 1000 mg via INTRAVENOUS
  Filled 2016-12-13: qty 200

## 2016-12-13 MED ORDER — NALOXONE HCL 2 MG/2ML IJ SOSY
2.0000 mg | PREFILLED_SYRINGE | Freq: Once | INTRAMUSCULAR | Status: AC
  Administered 2016-12-13: 2 mg via INTRAVENOUS

## 2016-12-13 MED ORDER — LORAZEPAM 2 MG/ML IJ SOLN
INTRAMUSCULAR | Status: AC
  Filled 2016-12-13: qty 1

## 2016-12-13 MED ORDER — DEXTROSE 50 % IV SOLN
1.0000 | Freq: Once | INTRAVENOUS | Status: AC
  Administered 2016-12-13: 50 mL via INTRAVENOUS

## 2016-12-13 MED ORDER — SODIUM CHLORIDE 0.9 % IV SOLN
Freq: Once | INTRAVENOUS | Status: AC
  Administered 2016-12-13: 23:00:00 via INTRAVENOUS

## 2016-12-13 MED ORDER — LORAZEPAM 2 MG/ML IJ SOLN
2.0000 mg | Freq: Once | INTRAMUSCULAR | Status: AC
  Administered 2016-12-13: 2 mg via INTRAVENOUS

## 2016-12-13 MED ORDER — ASPIRIN 300 MG RE SUPP
300.0000 mg | Freq: Once | RECTAL | Status: AC
  Administered 2016-12-13: 300 mg via RECTAL
  Filled 2016-12-13: qty 1

## 2016-12-13 MED ORDER — LORAZEPAM 2 MG/ML IJ SOLN
1.0000 mg | Freq: Once | INTRAMUSCULAR | Status: AC
  Administered 2016-12-13: 1 mg via INTRAVENOUS

## 2016-12-13 MED ORDER — LORAZEPAM 2 MG/ML IJ SOLN: 2.0000 mg | Freq: Once | INTRAMUSCULAR | Status: DC

## 2016-12-13 MED ORDER — PIPERACILLIN-TAZOBACTAM 3.375 G IVPB 30 MIN
3.3750 g | Freq: Once | INTRAVENOUS | Status: AC
  Administered 2016-12-13: 3.375 g via INTRAVENOUS
  Filled 2016-12-13: qty 50

## 2016-12-13 MED ORDER — ONDANSETRON HCL 4 MG/2ML IJ SOLN
4.0000 mg | Freq: Once | INTRAMUSCULAR | Status: DC
  Filled 2016-12-13: qty 2

## 2016-12-13 MED ORDER — NALOXONE HCL 2 MG/2ML IJ SOSY
PREFILLED_SYRINGE | INTRAMUSCULAR | Status: AC
  Filled 2016-12-13: qty 2

## 2016-12-13 MED ORDER — SODIUM CHLORIDE 0.9 % IV BOLUS (SEPSIS)
1000.0000 mL | Freq: Once | INTRAVENOUS | Status: AC
  Administered 2016-12-13: 1000 mL via INTRAVENOUS

## 2016-12-13 MED ORDER — DEXTROSE 50 % IV SOLN
INTRAVENOUS | Status: AC
  Administered 2016-12-13: 50 mL
  Filled 2016-12-13: qty 50

## 2016-12-13 NOTE — ED Notes (Signed)
Applied bare warmer to pt

## 2016-12-13 NOTE — ED Triage Notes (Signed)
Per EMS, pt was found unresponsive in bed, Last known well is unknown other than yesterday.  CBG "low" given 25G D-50, increased to 113.  Brady at 57 increased to 64.  Not responsive to stimuli,

## 2016-12-13 NOTE — ED Notes (Signed)
Pt to CT w/ RN

## 2016-12-13 NOTE — ED Notes (Signed)
Sister has pt's daughter on the phone, Provider notified and spoke to daughter.

## 2016-12-13 NOTE — ED Notes (Signed)
ED Provider at bedside. 

## 2016-12-13 NOTE — ED Notes (Signed)
Positive response to narcan, responded to sister's voice by verbally responding to sister.  Told her his name

## 2016-12-13 NOTE — ED Provider Notes (Signed)
[facility] DEPT Provider Note   CSN: [id number] Arrival date & time: 12/13/16  2128     History   Chief Complaint Chief Complaint  Patient presents with  . Loss of Consciousness    HPI [name] [name] is a 71 y.o. male.  HPI Per EMS, pt was found unresponsive in bed, Last known well is unknown other than yesterday.  CBG "low" (which means <20) given 25G D-50, increased to 113.  Brady at 54 increased to 93.  Not responsive to stimuli Blood pressure was within normal limits He was found with a use syringe for NovoLog next to him He is a known diabetic who is typically noncompliant per review of records No further history obtained given altered mental status  Past Medical History:  Diagnosis Date  . Cerebral infarction due to thrombosis of right posterior cerebral artery ([place]) 06/08/2015  . Closed comminuted intertrochanteric fracture of left femur ([place])   . Diabetes mellitus without complication ([place])   . Diabetic hyperosmolar non-ketotic state ([place]) 05/15/2016  . DKA (diabetic ketoacidoses) ([name]) 05/09/2016  . Hypertension   . Postoperative anemia due to acute blood loss 06/18/2016  . Retroperitoneal hematoma 06/18/2016  . Vitamin B 12 deficiency 06/18/2016    Patient Active Problem List   Diagnosis Date Noted  . Sepsis ([place]) 12/13/2016  . DKA (diabetic ketoacidoses) ([place]) 11/16/2016  . Generalized weakness 11/16/2016  . Acute renal failure with acute tubular necrosis superimposed on stage 3 chronic kidney disease ([place])   . Type 2 diabetes mellitus with ketoacidotic coma, without long-term current use of insulin   . Elevated troponin   . Deep vein thrombosis (DVT) of left lower extremity ([place])   . Uncontrolled type 2 diabetes mellitus with complication ([place])   . Encephalopathy, metabolic   . Acute renal failure superimposed on stage 3 chronic kidney disease ([place])   . Idiopathic hypotension   . Non-traumatic rhabdomyolysis   . Diabetic acidosis without coma ([place]) 10/01/2016  .  Fall 08/17/2016  . Rhabdomyolysis 08/17/2016  . Noncompliance with medication regimen 08/17/2016  . On continuous oral anticoagulation 08/17/2016  . Syncope and collapse 07/06/2016  . Left leg DVT ([place]) 07/06/2016  . Syncope 07/05/2016  . Postoperative anemia due to acute blood loss 06/18/2016  . Retroperitoneal hematoma 06/18/2016  . Vitamin B 12 deficiency 06/18/2016  . Elective surgery   . Postop check   . Closed comminuted intertrochanteric fracture of left femur ([place])   . Renal insufficiency   . Hyponatremia 05/15/2016  . Anxiety 05/15/2016  . History of CVA (cerebrovascular accident) 05/15/2016  . Acute on chronic renal failure ([place]) 05/09/2016  . Hypoglycemia 11/02/2015  . Chronic prescription benzodiazepine use 11/02/2015  . Narcotic dependency, continuous ([place]) 11/02/2015  . HTN (hypertension), malignant 11/01/2015  . Uncontrolled type 2 diabetes mellitus with diabetic nephropathy, with long-term current use of insulin ([place]) 08/12/2015  . Benign essential HTN 08/12/2015  . Dyslipidemia associated with type 2 diabetes mellitus ([place]) 08/12/2015  . Depression 08/12/2015  . CKD (chronic kidney disease) stage 3, GFR 30-59 ml/min 03/20/2014    Past Surgical History:  Procedure Laterality Date  . HIP ARTHROPLASTY Right 02/05/2013   Procedure: ARTHROPLASTY BIPOLAR HIP;  Surgeon: [name], MD;  Location: [facility];  Service: Orthopedics;  Laterality: Right;  . INTRAMEDULLARY (IM) NAIL INTERTROCHANTERIC Left 06/16/2016   Procedure: INTRAMEDULLARY (IM) NAIL INTERTROCHANTRIC;  Surgeon: Rod Can, MD;  Location: [facility];  Service: Orthopedics;  Laterality: Left;       Home Medications  Prior to Admission medications   Medication Sig Start Date End Date Taking? Authorizing Provider  acetaminophen (TYLENOL) 325 MG tablet Take 2 tablets (650 mg total) by mouth every 6 (six) hours as needed for mild pain (or Fever >/= 101). 08/14/15   Robbie Lis, MD  amLODipine (NORVASC)  10 MG tablet Take 1 tablet (10 mg total) by mouth daily. Patient not taking: Reported on 11/16/2016 08/14/15   Robbie Lis, MD  aspirin EC 81 MG EC tablet Take 1 tablet (81 mg total) by mouth daily. 11/20/16   Shanker Kristeen Mans, MD  carvedilol (COREG) 6.25 MG tablet Take 1 tablet (6.25 mg total) by mouth 2 (two) times daily with a meal. 05/12/16   Thurnell Lose, MD  cyanocobalamin (,VITAMIN B-12,) 1000 MCG/ML injection Inject 1 mL (1,000 mcg total) into the muscle every 30 (thirty) days. Patient not taking: Reported on 11/16/2016 06/20/16   Venetia Maxon Rama, MD  FLUoxetine (PROZAC) 20 MG capsule Take 20 mg by mouth daily.    Historical Provider, MD  insulin aspart (NOVOLOG) 100 UNIT/ML injection 0-15 Units, Subcutaneous, 3 times daily with meals CBG < 70: implement hypoglycemia protocol CBG 70 - 120: 0 units CBG 121 - 150: 2 units CBG 151 - 200: 3 units CBG 201 - 250: 5 units CBG 251 - 300: 8 units CBG 301 - 350: 11 units CBG 351 - 400: 15 units CBG > 400: call MD 11/20/16   Jonetta Osgood, MD  insulin glargine (LANTUS) 100 UNIT/ML injection Inject 0.1 mLs (10 Units total) into the skin daily. 11/20/16   Shanker Kristeen Mans, MD  senna (SENOKOT) 8.6 MG TABS tablet Take 1 tablet (8.6 mg total) by mouth 2 (two) times daily. Patient not taking: Reported on 11/16/2016 06/20/16   Venetia Maxon Rama, MD  thiamine 100 MG tablet Take 1 tablet (100 mg total) by mouth daily. 11/03/15   Nat Math, MD    Family History Family History  Problem Relation Age of Onset  . Diabetes Mother   . Alzheimer's disease Mother   . Hypertension Mother   . Hyperlipidemia Father   . Hypertension Father   . Healthy Maternal Grandmother   . Pneumonia Maternal Grandfather     Social History Social History  Substance Use Topics  . Smoking status: Former Smoker    Packs/day: 0.25    Years: 30.00    Types: Cigarettes    Quit date: 01/31/2013  . Smokeless tobacco: Never Used  . Alcohol use No     Allergies   Patient  has no known allergies.   Review of Systems Review of Systems  Unable to perform ROS: Mental status change     Physical Exam Updated Vital Signs BP (!) 85/58   Pulse 63   Temp (!) 94.4 F (34.7 C) (Rectal)   Resp 17   SpO2 100%   Physical Exam  Constitutional: He appears well-developed and well-nourished. No distress.  HENT:  Head: Normocephalic and atraumatic.  Eyes: Conjunctivae are normal. Pupils are equal, round, and reactive to light. Right eye exhibits no discharge. Left eye exhibits no discharge.  Neck: Normal range of motion. Neck supple.  Cardiovascular: Normal rate and regular rhythm.   Pulmonary/Chest: Effort normal and breath sounds normal. No respiratory distress.  Abdominal: Soft. Bowel sounds are normal. He exhibits no distension and no mass.  Musculoskeletal: He exhibits no edema.  Neurological:  Somnolent and clenching mouth, bilateral hands  Skin: Skin is warm. He is not  diaphoretic.  Nursing note and vitals reviewed.    ED Treatments / Results  Labs (all labs ordered are listed, but only abnormal results are displayed) Labs Reviewed  CBC WITH DIFFERENTIAL/PLATELET - Abnormal; Notable for the following:       Result Value   WBC 11.2 (*)    RBC 2.75 (*)    Hemoglobin 8.6 (*)    HCT 26.2 (*)    RDW 15.7 (*)    Monocytes Absolute 1.4 (*)    All other components within normal limits  RAPID URINE DRUG SCREEN, HOSP PERFORMED - Abnormal; Notable for the following:    Opiates POSITIVE (*)    All other components within normal limits  URINALYSIS, ROUTINE W REFLEX MICROSCOPIC - Abnormal; Notable for the following:    Glucose, UA >=500 (*)    Protein, ur 30 (*)    Bacteria, UA RARE (*)    All other components within normal limits  AMMONIA - Abnormal; Notable for the following:    Ammonia 40 (*)    All other components within normal limits  CBG MONITORING, ED - Abnormal; Notable for the following:    Glucose-Capillary 51 (*)    All other components  within normal limits  I-STAT CHEM 8, ED - Abnormal; Notable for the following:    BUN 39 (*)    Creatinine, Ser 1.90 (*)    Glucose, Bld 312 (*)    Calcium, Ion 1.06 (*)    Hemoglobin 9.2 (*)    HCT 27.0 (*)    All other components within normal limits  I-STAT VENOUS BLOOD GAS, ED - Abnormal; Notable for the following:    pO2, Ven 47.0 (*)    Acid-base deficit 6.0 (*)    All other components within normal limits  I-STAT CG4 LACTIC ACID, ED - Abnormal; Notable for the following:    Lactic Acid, Venous 2.36 (*)    All other components within normal limits  CBG MONITORING, ED - Abnormal; Notable for the following:    Glucose-Capillary 265 (*)    All other components within normal limits  CBG MONITORING, ED - Abnormal; Notable for the following:    Glucose-Capillary 176 (*)    All other components within normal limits  CBG MONITORING, ED - Abnormal; Notable for the following:    Glucose-Capillary 151 (*)    All other components within normal limits  CBG MONITORING, ED - Abnormal; Notable for the following:    Glucose-Capillary 122 (*)    All other components within normal limits  CULTURE, BLOOD (ROUTINE X 2)  CULTURE, BLOOD (ROUTINE X 2)  CK  I-STAT TROPOININ, ED  I-STAT VENOUS BLOOD GAS, ED    EKG  EKG Interpretation None       Radiology Ct Head Wo Contrast  Result Date: 12/13/2016 CLINICAL DATA:  Possible seizure, acute onset. Patient found unresponsive. Initial encounter. EXAM: CT HEAD WITHOUT CONTRAST TECHNIQUE: Contiguous axial images were obtained from the base of the skull through the vertex without intravenous contrast. COMPARISON:  CT of the head performed 10/01/2016 FINDINGS: Brain: No evidence of acute infarction, hemorrhage, hydrocephalus, extra-axial collection or mass lesion/mass effect. Prominence of the ventricles and sulci reflects mild cortical volume loss. Mild cerebellar atrophy is noted. Scattered periventricular white matter change likely reflects small  vessel ischemic microangiopathy. The brainstem and fourth ventricle are within normal limits. The basal ganglia are unremarkable in appearance. The cerebral hemispheres demonstrate grossly normal gray-white differentiation. No mass effect or midline shift is seen. Vascular:  No hyperdense vessel or unexpected calcification. Skull: There is no evidence of fracture; visualized osseous structures are unremarkable in appearance. Sinuses/Orbits: The orbits are within normal limits. There is mild partial opacification of the left maxillary sinus. The remaining paranasal sinuses and mastoid air cells are well-aerated. Other: No significant soft tissue abnormalities are seen. IMPRESSION: 1. No acute intracranial pathology seen on CT. 2. Mild cortical volume loss and scattered small vessel ischemic microangiopathy. 3. Mild partial opacification of the left maxillary sinus. Electronically Signed   By: Garald Balding M.D.   On: 12/13/2016 22:46   Dg Chest Portable 1 View  Result Date: 12/13/2016 CLINICAL DATA:  Seizure. EXAM: PORTABLE CHEST 1 VIEW COMPARISON:  Radiographs 11/16/2016 FINDINGS: Lower lung volumes from prior exam. There is mild cardiomegaly. Mild vascular congestion and cephalization of pulmonary vasculature. No focal airspace disease, pleural effusion or pneumothorax. No acute osseous abnormalities are seen. Degenerative change in both shoulders. IMPRESSION: Mild cardiomegaly and vascular congestion. This is new from prior exam. Electronically Signed   By: Jeb Levering M.D.   On: 12/13/2016 22:26    Procedures Procedures (including critical care time)  Medications Ordered in ED Medications  dextrose 10 % infusion ( Intravenous Rate/Dose Change 12/13/16 2250)  LORazepam (ATIVAN) 2 MG/ML injection (not administered)  LORazepam (ATIVAN) 2 MG/ML injection (not administered)  vancomycin (VANCOCIN) IVPB 1000 mg/200 mL premix (1,000 mg Intravenous New Bag/Given 12/13/16 2309)  naloxone [facility]) 2 MG/2ML  injection (not administered)  dextrose 50 % solution 50 mL (50 mLs Intravenous Given 12/13/16 2136)  dextrose 50 % solution (50 mLs  Given 12/13/16 2136)  LORazepam (ATIVAN) injection 1 mg (1 mg Intravenous Given 12/13/16 2143)  aspirin suppository 300 mg (300 mg Rectal Given 12/13/16 2147)  LORazepam (ATIVAN) injection 2 mg (2 mg Intravenous Given 12/13/16 2159)  piperacillin-tazobactam (ZOSYN) IVPB 3.375 g (3.375 g Intravenous New Bag/Given 12/13/16 2319)  naloxone [facility]) injection 2 mg (2 mg Intravenous Given 12/13/16 2252)  0.9 %  sodium chloride infusion ( Intravenous New Bag/Given 12/13/16 2252)  sodium chloride 0.9 % bolus 1,000 mL (1,000 mLs Intravenous New Bag/Given 12/13/16 2347)     Initial Impression / Assessment and Plan / ED Course  I have reviewed the triage vital signs and the nursing notes.  Pertinent labs & imaging results that were available during my care of the patient were reviewed by me and considered in my medical decision making (see chart for details).     Unclear etiology for mental status changes Patient was initially hypoglycemic and possibly seizing upon arrival, received additional D50 the patient continued very somnolent and clenching his jaw. Repeat CBG was within normal limits. Ativan administered 2 without significant improvement. Narcan given and patient has woken up some but unclear if this is from Narcan or metabolism of benzos as patient continues significantly somnolent. Patient is protecting his airway.  EKG with T-wave inversions in anterior leads which are new but negative troponin Code sepsis not initially called as no infectious symptoms and acute onset with associated hypoglycemia; however, given mild leukocytosis as well as hypothermia without other clear etiology, broad-spectrum antibiotics initiated Patient's labs reviewed and showed initial lactic acidosis; fluid started due to developing hypotension CT head negative for acute abnormality Urinalysis and  UDS with only narcotics but no infection or other substances Discussed situation with patient's sisters, and his daughter Coralyn Mark, at [phone]. Patient will remain full code. Patient has responded to fluids and is appropriate for hospitalist service, who has accepted patient  Final Clinical Impressions(s) / ED Diagnoses   Final diagnoses:  Hypoglycemia  Somnolence    New Prescriptions New Prescriptions   No medications on file     Karma Greaser, MD 12/13/16 2357    Carmin Muskrat, MD 12/18/16 2152

## 2016-12-14 ENCOUNTER — Encounter (HOSPITAL_COMMUNITY): Payer: Self-pay | Admitting: Family Medicine

## 2016-12-14 DIAGNOSIS — N179 Acute kidney failure, unspecified: Secondary | ICD-10-CM | POA: Diagnosis present

## 2016-12-14 DIAGNOSIS — Z8673 Personal history of transient ischemic attack (TIA), and cerebral infarction without residual deficits: Secondary | ICD-10-CM

## 2016-12-14 DIAGNOSIS — Z86718 Personal history of other venous thrombosis and embolism: Secondary | ICD-10-CM

## 2016-12-14 DIAGNOSIS — E162 Hypoglycemia, unspecified: Secondary | ICD-10-CM | POA: Diagnosis present

## 2016-12-14 DIAGNOSIS — A419 Sepsis, unspecified organism: Secondary | ICD-10-CM

## 2016-12-14 DIAGNOSIS — F1120 Opioid dependence, uncomplicated: Secondary | ICD-10-CM

## 2016-12-14 LAB — COMPREHENSIVE METABOLIC PANEL
ALT: 27 U/L (ref 17–63)
AST: 25 U/L (ref 15–41)
Albumin: 2.8 g/dL — ABNORMAL LOW (ref 3.5–5.0)
Alkaline Phosphatase: 142 U/L — ABNORMAL HIGH (ref 38–126)
Anion gap: 10 (ref 5–15)
BUN: 39 mg/dL — ABNORMAL HIGH (ref 6–20)
CO2: 19 mmol/L — ABNORMAL LOW (ref 22–32)
Calcium: 7.7 mg/dL — ABNORMAL LOW (ref 8.9–10.3)
Chloride: 107 mmol/L (ref 101–111)
Creatinine, Ser: 2.03 mg/dL — ABNORMAL HIGH (ref 0.61–1.24)
GFR calc Af Amer: 36 mL/min — ABNORMAL LOW (ref >60)
GFR calc non Af Amer: 31 mL/min — ABNORMAL LOW (ref >60)
Glucose, Bld: 153 mg/dL — ABNORMAL HIGH (ref 65–99)
Potassium: 5.3 mmol/L — ABNORMAL HIGH (ref 3.5–5.1)
Sodium: 136 mmol/L (ref 135–145)
Total Bilirubin: 0.4 mg/dL (ref 0.3–1.2)
Total Protein: 5.6 g/dL — ABNORMAL LOW (ref 6.5–8.1)

## 2016-12-14 LAB — CBC WITH DIFFERENTIAL/PLATELET
Basophils Absolute: 0 10*3/uL (ref 0.0–0.1)
Basophils Relative: 0 %
Eosinophils Absolute: 0.1 10*3/uL (ref 0.0–0.7)
Eosinophils Relative: 1 %
HCT: 22.3 % — ABNORMAL LOW (ref 39.0–52.0)
Hemoglobin: 7.4 g/dL — ABNORMAL LOW (ref 13.0–17.0)
Lymphocytes Relative: 15 %
Lymphs Abs: 1.2 10*3/uL (ref 0.7–4.0)
MCH: 31.4 pg (ref 26.0–34.0)
MCHC: 33.2 g/dL (ref 30.0–36.0)
MCV: 94.5 fL (ref 78.0–100.0)
Monocytes Absolute: 0.9 10*3/uL (ref 0.1–1.0)
Monocytes Relative: 11 %
Neutro Abs: 5.9 10*3/uL (ref 1.7–7.7)
Neutrophils Relative %: 73 %
Platelets: 283 10*3/uL (ref 150–400)
RBC: 2.36 MIL/uL — ABNORMAL LOW (ref 4.22–5.81)
RDW: 15.7 % — ABNORMAL HIGH (ref 11.5–15.5)
WBC: 8 10*3/uL (ref 4.0–10.5)

## 2016-12-14 LAB — CBG MONITORING, ED: Glucose-Capillary: 143 mg/dL — ABNORMAL HIGH (ref 65–99)

## 2016-12-14 LAB — I-STAT CG4 LACTIC ACID, ED: Lactic Acid, Venous: 3.3 mmol/L (ref 0.5–1.9)

## 2016-12-14 LAB — TROPONIN I
Troponin I: <0.03 ng/mL (ref <0.03)
Troponin I: <0.03 ng/mL (ref <0.03)
Troponin I: <0.03 ng/mL (ref <0.03)

## 2016-12-14 LAB — PROTIME-INR
INR: 0.97
Prothrombin Time: 12.9 seconds (ref 11.4–15.2)

## 2016-12-14 LAB — I-STAT TROPONIN, ED: Troponin i, poc: 0 ng/mL (ref 0.00–0.08)

## 2016-12-14 LAB — GLUCOSE, CAPILLARY
Glucose-Capillary: 147 mg/dL — ABNORMAL HIGH (ref 65–99)
Glucose-Capillary: 231 mg/dL — ABNORMAL HIGH (ref 65–99)
Glucose-Capillary: 422 mg/dL — ABNORMAL HIGH (ref 65–99)
Glucose-Capillary: 386 mg/dL — ABNORMAL HIGH (ref 65–99)
Glucose-Capillary: 466 mg/dL — ABNORMAL HIGH (ref 65–99)
Glucose-Capillary: 187 mg/dL — ABNORMAL HIGH (ref 65–99)
Glucose-Capillary: 64 mg/dL — ABNORMAL LOW (ref 65–99)
Glucose-Capillary: 211 mg/dL — ABNORMAL HIGH (ref 65–99)
Glucose-Capillary: 214 mg/dL — ABNORMAL HIGH (ref 65–99)
Glucose-Capillary: 145 mg/dL — ABNORMAL HIGH (ref 65–99)

## 2016-12-14 LAB — LACTIC ACID, PLASMA
Lactic Acid, Venous: 3.5 mmol/L (ref 0.5–1.9)
Lactic Acid, Venous: 1.2 mmol/L (ref 0.5–1.9)

## 2016-12-14 LAB — PROCALCITONIN: Procalcitonin: 1.55 ng/mL

## 2016-12-14 LAB — TSH: TSH: 1.663 u[IU]/mL (ref 0.350–4.500)

## 2016-12-14 LAB — MRSA PCR SCREENING: MRSA by PCR: NEGATIVE

## 2016-12-14 LAB — APTT: aPTT: 24 seconds (ref 24–36)

## 2016-12-14 MED ORDER — VANCOMYCIN HCL IN DEXTROSE 1-5 GM/200ML-% IV SOLN: 1000.0000 mg | INTRAVENOUS | Status: DC

## 2016-12-14 MED ORDER — ACETAMINOPHEN 325 MG PO TABS: 650.0000 mg | ORAL_TABLET | Freq: Four times a day (QID) | ORAL | Status: DC | PRN

## 2016-12-14 MED ORDER — SODIUM CHLORIDE 0.9 % IV SOLN: INTRAVENOUS | Status: DC

## 2016-12-14 MED ORDER — SODIUM CHLORIDE 0.9% FLUSH
3.0000 mL | Freq: Two times a day (BID) | INTRAVENOUS | Status: DC
  Administered 2016-12-14 – 2016-12-17 (×5): 3 mL via INTRAVENOUS

## 2016-12-14 MED ORDER — FLUOXETINE HCL 20 MG PO CAPS
20.0000 mg | ORAL_CAPSULE | Freq: Every day | ORAL | Status: DC
  Administered 2016-12-14 – 2016-12-17 (×4): 20 mg via ORAL
  Filled 2016-12-14 (×4): qty 1

## 2016-12-14 MED ORDER — VITAMIN B-1 100 MG PO TABS
100.0000 mg | ORAL_TABLET | Freq: Every day | ORAL | Status: DC
  Administered 2016-12-14 – 2016-12-17 (×4): 100 mg via ORAL
  Filled 2016-12-14 (×4): qty 1

## 2016-12-14 MED ORDER — SODIUM CHLORIDE 0.9 % IV BOLUS (SEPSIS): 500.0000 mL | Freq: Once | INTRAVENOUS | Status: DC

## 2016-12-14 MED ORDER — PIPERACILLIN-TAZOBACTAM 3.375 G IVPB
3.3750 g | Freq: Three times a day (TID) | INTRAVENOUS | Status: DC
  Administered 2016-12-14 (×2): 3.375 g via INTRAVENOUS
  Filled 2016-12-14 (×2): qty 50

## 2016-12-14 MED ORDER — ASPIRIN EC 81 MG PO TBEC
81.0000 mg | DELAYED_RELEASE_TABLET | Freq: Every day | ORAL | Status: DC
  Administered 2016-12-14 – 2016-12-17 (×4): 81 mg via ORAL
  Filled 2016-12-14 (×4): qty 1

## 2016-12-14 MED ORDER — DEXTROSE 50 % IV SOLN
INTRAVENOUS | Status: AC
  Administered 2016-12-14: 25 mL
  Filled 2016-12-14: qty 50

## 2016-12-14 MED ORDER — INSULIN ASPART 100 UNIT/ML ~~LOC~~ SOLN
0.0000 [IU] | SUBCUTANEOUS | Status: DC
  Administered 2016-12-14: 15 [IU] via SUBCUTANEOUS
  Administered 2016-12-14: 5 [IU] via SUBCUTANEOUS

## 2016-12-14 MED ORDER — ACETAMINOPHEN 650 MG RE SUPP: 650.0000 mg | Freq: Four times a day (QID) | RECTAL | Status: DC | PRN

## 2016-12-14 MED ORDER — INSULIN ASPART 100 UNIT/ML ~~LOC~~ SOLN: 0.0000 [IU] | Freq: Every day | SUBCUTANEOUS | Status: DC

## 2016-12-14 MED ORDER — INSULIN ASPART 100 UNIT/ML ~~LOC~~ SOLN
0.0000 [IU] | Freq: Three times a day (TID) | SUBCUTANEOUS | Status: DC
  Administered 2016-12-14: 5 [IU] via SUBCUTANEOUS
  Administered 2016-12-15: 3 [IU] via SUBCUTANEOUS
  Administered 2016-12-15: 11 [IU] via SUBCUTANEOUS
  Administered 2016-12-16 (×2): 5 [IU] via SUBCUTANEOUS
  Administered 2016-12-16: 2 [IU] via SUBCUTANEOUS
  Administered 2016-12-17: 5 [IU] via SUBCUTANEOUS
  Administered 2016-12-17: 3 [IU] via SUBCUTANEOUS

## 2016-12-14 MED ORDER — HEPARIN SODIUM (PORCINE) 5000 UNIT/ML IJ SOLN
5000.0000 [IU] | Freq: Three times a day (TID) | INTRAMUSCULAR | Status: DC
  Administered 2016-12-14 – 2016-12-17 (×10): 5000 [IU] via SUBCUTANEOUS
  Filled 2016-12-14 (×8): qty 1

## 2016-12-14 MED ORDER — SODIUM CHLORIDE 0.9 % IV BOLUS (SEPSIS)
1000.0000 mL | Freq: Once | INTRAVENOUS | Status: AC
  Administered 2016-12-14: 1000 mL via INTRAVENOUS

## 2016-12-14 NOTE — Progress Notes (Signed)
[facility] TEAM 1 - Stepdown/ICU TEAM  Kyri Dai  NLG:[id number] DOB: 03-Mar-1946 DOA: 12/13/2016 PCP: Mauricio Po, [name]    Brief Narrative:  71 y.o. male with history significant of insulin-dependent diabetes mellitus, hypertension, CVA, DVT no longer on anticoagulation, polysubstance abuse, and chronic kidney disease stage III who was brought to the emergency department after being found unresponsive at home by family. Patient's sister had spoken with him on the phone at approximately 4 PM, only to find him unresponsive in his apartment at approximately 8 PM. EMS was called and found his CBG to be < 20.  He was treated with an ampule of 50% dextrose with repeat CBG in the 100s. He was noted to be bradycardic in the 40s, increasing to the 60s after dextrose. Vitals were otherwise stable but the patient would not respond to noxious stimuli. His sisters acknowledged that he is addicted to "pain pills." He is only sporadically adherent with his insulin regimen. Patient has had hypoglycemic episodes in the past as well as multiple admissions for DKA.  His mental status did improve briefly after a dose of narcan in the ED.    Subjective: Pt is seen for a f/u visit.  Pt's daughter tells me she checked his blood sugar around 4PM, the meter read "E02" - she interpreted this as meaning it was very high - and so she gave him 20 units of insulin.    Assessment & Plan:  Hypoglycemia with coma, insulin-dependent DM   Appears to have been due to accidental insulin OD per family   Acute kidney injury superimposed on CKD stage III  SCr 1.90 on admission - baseline range appears to be 1.2-2.0 - prerenal azotemia in setting of dehydration and hypotension   Polysubstance abuse  Family reported "he has an addiction to pain pills" - Opiates noted on UDS but not on Rx med list   History of CVA  Continue ASA 81    History of DVT  Completed 5 mos anticoagulation and was taken off at time of recent discharge  secondary to poor adherence, frequent AMS and falls   Normocytic anemia  Hgb 8.6 on admission, stable relative to priors with no appreciable bleeding    Noncompliance   DVT prophylaxis: Subcutaneous heparin Code Status: FULL CODE Family Communication: Spoke with daughter at bedside  Disposition Plan: SDU  Consultants:  none  Procedures: none  Antimicrobials:  Vanc 3/3 Zosyn 3/3  Objective: Blood pressure (!) 101/53, pulse 73, temperature 99.1 F (37.3 C), temperature source Oral, resp. rate 14, height 5\' 9"  (1.753 m), weight 63.8 kg (140 lb 10.5 oz), SpO2 100 %.  Intake/Output Summary (Last 24 hours) at 12/14/16 1435 Last data filed at 12/14/16 1100  Gross per 24 hour  Intake             2425 ml  Output              900 ml  Net             1525 ml   Filed Weights   12/14/16 0100 12/14/16 0300  Weight: 63.8 kg (140 lb 10.5 oz) 63.8 kg (140 lb 10.5 oz)    Examination: Pt was seen for a f/u visit.    CBC:  Recent Labs Lab 12/13/16 2145 12/13/16 2151 12/14/16 0407  WBC 11.2*  --  8.0  NEUTROABS 7.5  --  5.9  HGB 8.6* 9.2* 7.4*  HCT 26.2* 27.0* 22.3*  MCV 95.3  --  94.5  PLT 339  --  606   Basic Metabolic Panel:  Recent Labs Lab 12/13/16 2151 12/14/16 0112  NA 136 136  K 5.0 5.3*  CL 106 107  CO2  --  19*  GLUCOSE 312* 153*  BUN 39* 39*  CREATININE 1.90* 2.03*  CALCIUM  --  7.7*   GFR: Estimated Creatinine Clearance: 30.1 mL/min (by C-G formula based on SCr of 2.03 mg/dL (H)).  Liver Function Tests:  Recent Labs Lab 12/14/16 0112  AST 25  ALT 27  ALKPHOS 142*  BILITOT 0.4  PROT 5.6*  ALBUMIN 2.8*    Recent Labs Lab 12/13/16 2146  AMMONIA 40*    Coagulation Profile:  Recent Labs Lab 12/14/16 0112  INR 0.97    Cardiac Enzymes:  Recent Labs Lab 12/13/16 2237 12/14/16 0112 12/14/16 0601 12/14/16 1240  CKTOTAL 51  --   --   --   TROPONINI  --  <0.03 <0.03 <0.03    HbA1C: Hgb A1c MFr Bld  Date/Time Value Ref Range  Status  11/19/2016 08:37 AM 10.5 (H) 4.8 - 5.6 % Final    Comment:    (NOTE)         Pre-diabetes: 5.7 - 6.4         Diabetes: >6.4         Glycemic control for adults with diabetes: <7.0   08/17/2016 02:56 PM 8.9 (H) 4.8 - 5.6 % Final    Comment:    (NOTE)         Pre-diabetes: 5.7 - 6.4         Diabetes: >6.4         Glycemic control for adults with diabetes: <7.0     CBG:  Recent Labs Lab 12/14/16 0437 12/14/16 0526 12/14/16 0655 12/14/16 0942 12/14/16 1125  GLUCAP 466* 386* 187* 64* 211*    Recent Results (from the past 240 hour(s))  MRSA PCR Screening     Status: None   Collection Time: 12/14/16  1:57 AM  Result Value Ref Range Status   MRSA by PCR NEGATIVE NEGATIVE Final    Comment:        The GeneXpert MRSA Assay (FDA approved for NASAL specimens only), is one component of a comprehensive MRSA colonization surveillance program. It is not intended to diagnose MRSA infection nor to guide or monitor treatment for MRSA infections.      Scheduled Meds: . aspirin EC  81 mg Oral Daily  . FLUoxetine  20 mg Oral Daily  . heparin  5,000 Units Subcutaneous Q8H  . insulin aspart  0-15 Units Subcutaneous Q4H  . piperacillin-tazobactam (ZOSYN)  IV  3.375 g Intravenous Q8H  . sodium chloride flush  3 mL Intravenous Q12H  . thiamine  100 mg Oral Daily  . [START ON 12/15/2016] vancomycin  1,000 mg Intravenous Q48H     LOS: 1 day   Time spent: No Charge  Cherene Altes, MD [facility] Office  [phone] Pager - Text Page per Amion as per below:  On-Call/Text Page:      [email or web address]      password [email or web address]  If 7PM-7AM, please contact night-coverage [email or web address] Password [email or web address] 12/14/2016, 2:35 PM

## 2016-12-14 NOTE — Progress Notes (Signed)
Paged Dr. Eulas Post of Lactic acid up to 3.5.  Will continue to monitor.

## 2016-12-14 NOTE — H&P (Signed)
History and Physical    [name] DDU:[id number] DOB: 12-19-1945 DOA: 12/13/2016  PCP: Mauricio Po, [name]   Patient coming from: Home  Chief Complaint: Found unresponsive   HPI: [name] [name] is a 71 y.o. 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 Patient's sister had spoken with him on the phone at approximately 4 PM, later finding him unresponsive in his apartment at approximately 8 PM. EMS was called out where they found CBG to be < 20.  He was treated with an ampule of 50% dextrose with repeat CBG in the 100s. He was noted to be bradycardic in the 40s, increasing to the 60s after dextrose. Vitals were otherwise stable but the patient would not respond to noxious stimuli. Family reports that he seemed to be in his usual state around 4 PM and has not had any recent complaints. He seemed to be doing well since his hospital discharge less than a month ago per family report. His sisters acknowledge that he is addicted to "pain pills." He is only sporadically adherent with his insulin regimen. Patient has had hypoglycemic episodes in the past as well as multiple admissions for DKA.  ED Course: Upon arrival to the ED, patient is found to be hypothermic to 34.7 C, saturating well on room air, and with vitals otherwise stable. Blood pressure began to fall with MAP less than 65 at some points and this improved with a 250 mL normal saline bolus. EKG features a sinus rhythm with PVCs and a repolarization abnormality. Chest x-ray is notable for mild cardiomegaly and new vascular congestion. Head CT is negative for acute intracranial abnormality, but notable for mild cortical volume loss and scattered small vessel ischemic microangiopathy. Chemistry  panel features a BUN of 39 and serum creatinine 1.90, up from 1.36 less than 1 month ago. CBC is notable for a mild leukocytosis to 11,200 and a stable normocytic anemia with hemoglobin 8.6. Lactic acid is elevated to 2.36, troponin is undetectable, UDS is positive for opiates, and urinalysis is unremarkable. Patient was started on a dextrose infusion, blood cultures were obtained, he was given 2 mg Narcan with brief increased responsiveness, and started on empiric vancomycin and Zosyn. Patient was given 300 mg aspirin PR. Glucose remains stable, warming blanket was placed, and the patient gradually began to respond some. He has continued to have a soft blood pressure, but with map remaining above 65. He is not in acute distress and seems to be protecting his airway. He will be admitted to the stepdown unit for ongoing evaluation and management of a hypoglycemic episode with coma, possibly secondary to sepsis, or to exogenous insulin.  Review of Systems:  Unable to obtain ROS secondary to patient's clinical condition.  Past Medical History:  Diagnosis Date  . 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 Closed comminuted intertrochanteric fracture of left femur ([place])   . Diabetes mellitus without complication ([place])   . Diabetic hyperosmolar non-ketotic state ([place]) 05/15/2016  . DKA (diabetic ketoacidoses) ([place]) 05/09/2016  . Hypertension   . Postoperative anemia due to acute blood loss 06/18/2016  . Retroperitoneal hematoma 06/18/2016  . Vitamin B 12 deficiency 06/18/2016    Past Surgical History:  Procedure Laterality Date  . HIP ARTHROPLASTY Right 02/05/2013  Procedure: ARTHROPLASTY BIPOLAR HIP;  Surgeon: [name], MD;  Location: [facility];  Service: Orthopedics;  Laterality: Right;  . INTRAMEDULLARY (IM) NAIL INTERTROCHANTERIC Left 06/16/2016   Procedure: INTRAMEDULLARY (IM) NAIL INTERTROCHANTRIC;  Surgeon: Rod Can, MD;  Location: [facility];   Service: Orthopedics;  Laterality: Left;     reports that he quit smoking about 3 years ago. His smoking use included Cigarettes. He has a 7.50 pack-year smoking history. He has never used smokeless tobacco. He reports that he does not drink alcohol or use drugs.  No Known Allergies  Family History  Problem Relation Age of Onset  . Diabetes Mother   . Alzheimer's disease Mother   . Hypertension Mother   . Hyperlipidemia Father   . Hypertension Father   . Healthy Maternal Grandmother   . Pneumonia Maternal Grandfather      Prior to Admission medications   Medication Sig Start Date End Date Taking? Authorizing Provider  acetaminophen (TYLENOL) 325 MG tablet Take 2 tablets (650 mg total) by mouth every 6 (six) hours as needed for mild pain (or Fever >/= 101). 08/14/15   Robbie Lis, MD  amLODipine (NORVASC) 10 MG tablet Take 1 tablet (10 mg total) by mouth daily. Patient not taking: Reported on 11/16/2016 08/14/15   Robbie Lis, MD  aspirin EC 81 MG EC tablet Take 1 tablet (81 mg total) by mouth daily. 11/20/16   Shanker Kristeen Mans, MD  carvedilol (COREG) 6.25 MG tablet Take 1 tablet (6.25 mg total) by mouth 2 (two) times daily with a meal. 05/12/16   Thurnell Lose, MD  cyanocobalamin (,VITAMIN B-12,) 1000 MCG/ML injection Inject 1 mL (1,000 mcg total) into the muscle every 30 (thirty) days. Patient not taking: Reported on 11/16/2016 06/20/16   Venetia Maxon Rama, MD  FLUoxetine (PROZAC) 20 MG capsule Take 20 mg by mouth daily.    Historical Provider, MD  insulin aspart (NOVOLOG) 100 UNIT/ML injection 0-15 Units, Subcutaneous, 3 times daily with meals CBG < 70: implement hypoglycemia protocol CBG 70 - 120: 0 units CBG 121 - 150: 2 units CBG 151 - 200: 3 units CBG 201 - 250: 5 units CBG 251 - 300: 8 units CBG 301 - 350: 11 units CBG 351 - 400: 15 units CBG > 400: call MD 11/20/16   Jonetta Osgood, MD  insulin glargine (LANTUS) 100 UNIT/ML injection Inject 0.1 mLs (10 Units total) into  the skin daily. 11/20/16   Shanker Kristeen Mans, MD  senna (SENOKOT) 8.6 MG TABS tablet Take 1 tablet (8.6 mg total) by mouth 2 (two) times daily. Patient not taking: Reported on 11/16/2016 06/20/16   Venetia Maxon Rama, MD  thiamine 100 MG tablet Take 1 tablet (100 mg total) by mouth daily. 11/03/15   Nat Math, MD    Physical Exam: Vitals:   12/14/16 0000 12/14/16 0010 12/14/16 0012 12/14/16 0030  BP: (!) 86/54 95/60  (!) 82/49  Pulse: 61  64 61  Resp: 17 16 17 15   Temp:      TempSrc:      SpO2: 100%  100% 100%      Constitutional: Obtunded, no cyanosis, no diaphoresis Eyes: PERTL, lids and conjunctivae normal ENMT: Mucous membranes are dry. Posterior pharynx clear of any exudate or lesions.   Neck: normal, supple, no masses, no thyromegaly Respiratory: clear to auscultation bilaterally, no wheezing, no crackles. No accessory muscle use.  Cardiovascular: S1 & S2 heard, regular rate and rhythm. No extremity edema. No significant  JVD. Abdomen: No distension, no tenderness, no masses palpated. Bowel sounds normal.  Musculoskeletal: no clubbing / cyanosis. No joint deformity upper and lower extremities.   Skin: no significant rashes, lesions, ulcers. Cool, dry, well-perfused. Neurologic: No apparent facial asymmetry. No tremor. Babinski response down-going b/l. Responding to noxious stimuli and making unintelligible vocalizations  Psychiatric: Difficult to assess given the clinical scenario.     Labs on Admission: I have personally reviewed following labs and imaging studies  CBC:  Recent Labs Lab 12/13/16 2145 12/13/16 2151  WBC 11.2*  --   NEUTROABS 7.5  --   HGB 8.6* 9.2*  HCT 26.2* 27.0*  MCV 95.3  --   PLT 339  --    Basic Metabolic Panel:  Recent Labs Lab 12/13/16 2151  NA 136  K 5.0  CL 106  GLUCOSE 312*  BUN 39*  CREATININE 1.90*   GFR: CrCl cannot be calculated (Unknown ideal weight.). Liver Function Tests: No results for input(s): AST, ALT, ALKPHOS,  BILITOT, PROT, ALBUMIN in the last 168 hours. No results for input(s): LIPASE, AMYLASE in the last 168 hours.  Recent Labs Lab 12/13/16 2146  AMMONIA 40*   Coagulation Profile: No results for input(s): INR, PROTIME in the last 168 hours. Cardiac Enzymes:  Recent Labs Lab 12/13/16 2237  CKTOTAL 51   BNP (last 3 results) No results for input(s): PROBNP in the last 8760 hours. HbA1C: No results for input(s): HGBA1C in the last 72 hours. CBG:  Recent Labs Lab 12/13/16 2140 12/13/16 2202 12/13/16 2234 12/13/16 2308 12/14/16 0025  GLUCAP 265* 176* 151* 122* 143*   Lipid Profile: No results for input(s): CHOL, HDL, LDLCALC, TRIG, CHOLHDL, LDLDIRECT in the last 72 hours. Thyroid Function Tests: No results for input(s): TSH, T4TOTAL, FREET4, T3FREE, THYROIDAB in the last 72 hours. Anemia Panel: No results for input(s): VITAMINB12, FOLATE, FERRITIN, TIBC, IRON, RETICCTPCT in the last 72 hours. Urine analysis:    Component Value Date/Time   COLORURINE YELLOW 12/13/2016 2140   APPEARANCEUR CLEAR 12/13/2016 2140   LABSPEC 1.013 12/13/2016 2140   PHURINE 5.0 12/13/2016 2140   GLUCOSEU >=500 (A) 12/13/2016 2140   HGBUR NEGATIVE 12/13/2016 2140   [place] NEGATIVE 12/13/2016 2140   [place] NEGATIVE 12/13/2016 2140   PROTEINUR 30 (A) 12/13/2016 2140   UROBILINOGEN 0.2 08/21/2015 2154   NITRITE NEGATIVE 12/13/2016 2140   LEUKOCYTESUR NEGATIVE 12/13/2016 2140   Sepsis Labs: @LABRCNTIP (procalcitonin:4,lacticidven:4) )No results found for this or any previous visit (from the past 240 hour(s)).   Radiological Exams on Admission: Ct Head Wo Contrast  Result Date: 12/13/2016 CLINICAL DATA:  Possible seizure, acute onset. Patient found unresponsive. Initial encounter. EXAM: CT HEAD WITHOUT CONTRAST TECHNIQUE: Contiguous axial images were obtained from the base of the skull through the vertex without intravenous contrast. COMPARISON:  CT of the head performed 10/01/2016  FINDINGS: Brain: No evidence of acute infarction, hemorrhage, hydrocephalus, extra-axial collection or mass lesion/mass effect. Prominence of the ventricles and sulci reflects mild cortical volume loss. Mild cerebellar atrophy is noted. Scattered periventricular white matter change likely reflects small vessel ischemic microangiopathy. The brainstem and fourth ventricle are within normal limits. The basal ganglia are unremarkable in appearance. The cerebral hemispheres demonstrate grossly normal gray-white differentiation. No mass effect or midline shift is seen. Vascular: No hyperdense vessel or unexpected calcification. Skull: There is no evidence of fracture; visualized osseous structures are unremarkable in appearance. Sinuses/Orbits: The orbits are within normal limits. There is mild partial opacification of the left maxillary sinus. The  remaining paranasal sinuses and mastoid air cells are well-aerated. Other: No significant soft tissue abnormalities are seen. IMPRESSION: 1. No acute intracranial pathology seen on CT. 2. Mild cortical volume loss and scattered small vessel ischemic microangiopathy. 3. Mild partial opacification of the left maxillary sinus. Electronically Signed   By: Garald Balding M.D.   On: 12/13/2016 22:46   Dg Chest Portable 1 View  Result Date: 12/13/2016 CLINICAL DATA:  Seizure. EXAM: PORTABLE CHEST 1 VIEW COMPARISON:  Radiographs 11/16/2016 FINDINGS: Lower lung volumes from prior exam. There is mild cardiomegaly. Mild vascular congestion and cephalization of pulmonary vasculature. No focal airspace disease, pleural effusion or pneumothorax. No acute osseous abnormalities are seen. Degenerative change in both shoulders. IMPRESSION: Mild cardiomegaly and vascular congestion. This is new from prior exam. Electronically Signed   By: Jeb Levering M.D.   On: 12/13/2016 22:26    EKG: Independently reviewed. Sinus rhythm, PVC, possible RVH with secondary repolarization abnormality.    Assessment/Plan  1. Hypoglycemia with coma, insulin-dependent DM   - Pt found down in his apartment ~8pm after being well at ~4pm   - CBG "<20" on EMS arrival with HR in 40's; CBG to 100's and HR to 60's with 1 amp D-50%  - Unclear etiology; likely secondary to exogenous insulin vs sepsis  - Pt conditioning improving in ED, beginning to respond to noxious stimuli, and later to voice; he appears to be protecting airway  - Continue a gentle dextrose infusion for now with frequent CBG's; monitor in stepdown initially    2. ?Sepsis   - Pt presents with mild leukocytosis, temp 34.7 C, AKI, elevated lactate  - No clear source of infection; CXR without infiltrate and no respiratory sxs; UA not consistent with infxn; abd exam benign; no wound identified  - Blood cultures obtained in ED; SBP <90 in ED and 30 cc/kg NS bolus will be completed  - Continue empiric coverage with vancomycin and Zosyn while following cultures and clinical progress   3. Acute kidney injury superimposed on CKD stage III  - SCr is 1.90 on admission, up from 1.36 three weeks ago  - Likely prerenal azotemia in setting of apparent dehydration, possible sepsis, and soft BP - He was given 30 cc/kg NS on admission; continued on IVF with NS  - Avoid nephrotoxins where possible, renally-dose medications   4. Polysubstance abuse  - Family in ED report "he has an addiction to pain pills" - Opiates noted on UDS, not on med list  - Responded some to Narcan in ED, but not dramatically so  - Social work consultation requested    5. History of CVA  - Continue ASA 81    6. History of DVT  - Completed 5 mos anticoagulation and was taken off at time of recent discharge secondary to poor adherence, frequent AMS and falls  - No evidence for acute VTE on admission    7. Normocytic anemia  - Hgb is 8.6 on admission, stable relative to priors with no appreciable bleeding   - Follow periodic CBC    DVT prophylaxis: sq heparin   Code Status: Full  Family Communication: Sisters updated at bedside Disposition Plan: Admit to stepdown Consults called: Neurology Admission status: Inpatient    Vianne Bulls, MD [facility] Pager [phone]  If 7PM-7AM, please contact night-coverage [email or web address] Password [facility]  12/14/2016, 12:38 AM

## 2016-12-14 NOTE — Progress Notes (Signed)
Pharmacy Antibiotic Note  [name] [name] is a 71 y.o. male admitted on 12/13/2016 with hypoglycemia/hypotension, possible sepsis.  Pharmacy has been consulted for Vancomycin and Zosyn dosing.  Plan: Vancomycin 1 g IV q48h Zosyn 3.375 g IV q8h   Height: 5\' 9"  (175.3 cm) Weight: 140 lb 10.5 oz (63.8 kg) IBW/kg (Calculated) : 70.7  Temp (24hrs), Avg:96 F (35.6 C), Min:94.4 F (34.7 C), Max:97.3 F (36.3 C)   Recent Labs Lab 12/13/16 2145 12/13/16 2151 12/14/16 0036 12/14/16 0112 12/14/16 0407  WBC 11.2*  --   --   --  8.0  CREATININE  --  1.90*  --  2.03*  --   LATICACIDVEN  --  2.36* 3.30* 3.5* 1.2    Estimated Creatinine Clearance: 30.1 mL/min (by C-G formula based on SCr of 2.03 mg/dL (H)).    No Known Allergies   Caryl Pina 12/14/2016 6:57 AM

## 2016-12-14 NOTE — Progress Notes (Signed)
Pt blood sugar was 64, pt is alert and oriented but NPO, pt given 65ml D50. Will notify Dr Thereasa Solo and recheck blood sugar. Consuelo Pandy RN

## 2016-12-15 LAB — BASIC METABOLIC PANEL
Anion gap: 9 (ref 5–15)
BUN: 36 mg/dL — ABNORMAL HIGH (ref 6–20)
CO2: 17 mmol/L — ABNORMAL LOW (ref 22–32)
Calcium: 8.1 mg/dL — ABNORMAL LOW (ref 8.9–10.3)
Chloride: 109 mmol/L (ref 101–111)
Creatinine, Ser: 1.84 mg/dL — ABNORMAL HIGH (ref 0.61–1.24)
GFR calc Af Amer: 41 mL/min — ABNORMAL LOW (ref >60)
GFR calc non Af Amer: 35 mL/min — ABNORMAL LOW (ref >60)
Glucose, Bld: 655 mg/dL (ref 65–99)
Potassium: 5.8 mmol/L — ABNORMAL HIGH (ref 3.5–5.1)
Sodium: 135 mmol/L (ref 135–145)

## 2016-12-15 LAB — GLUCOSE, CAPILLARY
Glucose-Capillary: 300 mg/dL — ABNORMAL HIGH (ref 65–99)
Glucose-Capillary: 172 mg/dL — ABNORMAL HIGH (ref 65–99)
Glucose-Capillary: 102 mg/dL — ABNORMAL HIGH (ref 65–99)

## 2016-12-15 LAB — CBC
HCT: 27.6 % — ABNORMAL LOW (ref 39.0–52.0)
Hemoglobin: 8.7 g/dL — ABNORMAL LOW (ref 13.0–17.0)
MCH: 30.2 pg (ref 26.0–34.0)
MCHC: 31.5 g/dL (ref 30.0–36.0)
MCV: 95.8 fL (ref 78.0–100.0)
Platelets: 321 10*3/uL (ref 150–400)
RBC: 2.88 MIL/uL — ABNORMAL LOW (ref 4.22–5.81)
RDW: 16 % — ABNORMAL HIGH (ref 11.5–15.5)
WBC: 6.7 10*3/uL (ref 4.0–10.5)

## 2016-12-15 MED ORDER — SODIUM POLYSTYRENE SULFONATE 15 GM/60ML PO SUSP
30.0000 g | Freq: Once | ORAL | Status: AC
  Administered 2016-12-15: 30 g via ORAL
  Filled 2016-12-15: qty 120

## 2016-12-15 MED ORDER — CARVEDILOL 3.125 MG PO TABS
3.1250 mg | ORAL_TABLET | Freq: Two times a day (BID) | ORAL | Status: DC
  Administered 2016-12-15 – 2016-12-17 (×5): 3.125 mg via ORAL
  Filled 2016-12-15 (×6): qty 1

## 2016-12-15 MED ORDER — INSULIN GLARGINE 100 UNIT/ML ~~LOC~~ SOLN
10.0000 [IU] | Freq: Every day | SUBCUTANEOUS | Status: DC
  Administered 2016-12-15 – 2016-12-16 (×2): 10 [IU] via SUBCUTANEOUS
  Filled 2016-12-15 (×2): qty 0.1

## 2016-12-15 MED ORDER — CARVEDILOL 6.25 MG PO TABS: 6.2500 mg | ORAL_TABLET | Freq: Two times a day (BID) | ORAL | Status: DC

## 2016-12-15 MED ORDER — INSULIN ASPART 100 UNIT/ML ~~LOC~~ SOLN
20.0000 [IU] | Freq: Once | SUBCUTANEOUS | Status: AC
  Administered 2016-12-15: 20 [IU] via SUBCUTANEOUS

## 2016-12-15 NOTE — Progress Notes (Signed)
Transferred in from 4 east.Placed in non tele bed comfortably.Bed alarm on.Skin assessed with Maudie Mercury R.N.-no issues.

## 2016-12-15 NOTE — Progress Notes (Signed)
12/15/16 19:23 NT found patient in the bathroom floor on his knees trying to get up,bathroom call bell was pulled. Patient states " my knees buckled". Patient denies any pain,no abrasion,no injury  noted. Patient denied hitting his head.Patient able to move all extremities without difficulty or pain. Assisted patient back to bed. Patient advised to call before trying to get up. 20:07 Kirby,NP text paged and made aware of patient's fall. 20:10 Attempted to call patient's sisters at [phone] but nobody picked up the phone. Will continue to monitor patient. Kei Langhorst, Wonda Cheng, Therapist, sports

## 2016-12-15 NOTE — Progress Notes (Addendum)
Inpatient Diabetes Program Recommendations  AACE/ADA: New Consensus Statement on Inpatient Glycemic Control (2015)  Target Ranges:  Prepandial:   less than 140 mg/dL      Peak postprandial:   less than 180 mg/dL (1-2 hours)      Critically ill patients:  140 - 180 mg/dL  Results for [name] [name], [name] [name] (MRN [id number]) as of 12/15/2016 09:40  Ref. Range 12/15/2016 05:43  Glucose Latest Ref Range: 65 - 99 mg/dL [facility])   Results for [name], [name] (MRN [id number]) as of 12/15/2016 09:40  Ref. Range 12/14/2016 06:55 12/14/2016 09:42 12/14/2016 11:25 12/14/2016 16:45 12/14/2016 20:58  Glucose-Capillary Latest Ref Range: 65 - 99 mg/dL 187 (H) 64 (L) 211 (H) 214 (H) 145 (H)   Review of Glycemic Control  Diabetes history: DM2 Outpatient Diabetes medications: Novolog 20 units BID (breakfast and supper), Lantus 10 units daily Current orders for Inpatient glycemic control: Novolog 0-15 units TID with meals, Novolog 0-5 units QHS  Inpatient Diabetes Program Recommendations: Insulin - Basal: Please consider ordering Lantus 7 units Q24H starting now.  Note: In reviewing chart, noted patient presented from home with hypoglycemia. Per progress note by Dr. Thereasa Solo  On 12/14/16, "pt's daughter tells me she checked his blood sugar around 4PM, the meter read "E02" - she interpreted this as meaning it was very high - and so she gave him 20 units of insulin."  Noted Alfonse Flavors, RN, Diabetes Coordinator talked with patient and his daughter over the phone on Nov 17, 2016 regarding DM and insulin. Bedside NURSING, please use each patient interaction to provide ongoing diabetes education to patient and his family.    Addendum 12/15/16@14 :22-Spoke with patient about DM and insulin. Patient reports that he checks his own glucose and he is able to tell me he takes "one insulin 10 units every morning and the other one 20 units BID (with breakfast and supper)".  Inquired about normal glucose values and patient is able to verbalize 80-130 mg/dl.  Inquired about glucose values he has been getting on glucometer and patient reports that he "doesn't remember". Discussed insulin issue at home contributing to hypoglycemia and patient reports that he is the one who got the error message on his glucometer and he thought it meant his glucose was high so he took the Novolog 20 units. Discussed glucometer trouble shooting and encouraged patient to look at glucometer handbook if he gets a code with an "E" because these are error codes and something is wrong and needs to find out what the message means. Discussed carbohydrates per meal. Patient reports that he drinks mostly milk and asked several times for 2 milks. Explained that milk has carbohydrates so he needs to limit the amount of milk he is drinking and count the carbohydrates he is drinking each time he has milk.  Patient verbalized understanding of information discussed and states that he has no questions or concerns at this time related to DM.   Thanks, Barnie Alderman, RN, MSN, CDE Diabetes Coordinator Inpatient Diabetes Program [phone] (Team Pager from 8am to 5pm)

## 2016-12-15 NOTE — Progress Notes (Signed)
Attempted to call report; nurse unavailable at this time.

## 2016-12-15 NOTE — Progress Notes (Signed)
Report given to Gwenlyn Perking RN pt to transfer to [phone] via w/c with belongings off tele as will be med surg.

## 2016-12-15 NOTE — Care Management Note (Addendum)
Case Management Note  Patient Details  Name: Cruise Baumgardner MRN: [id number] Date of Birth: 1946/01/21  Subjective/Objective:  From home presents with  Hypoglycemia with coma, insulin dep, acute kidney injury , psa, hx of cva, hx of dvt, hx of anemia, and noncompliance,  NCM will cont to folow for dc needs.    He lives alone, has medication coverage and has pcp, Dr. Elna Breslow, he has transportation at dc. He has a walker and a w/chair at home.    Await pt eval.  NCM will cont to follow for dc needs.             Action/Plan:   Expected Discharge Date:  12/17/16               Expected Discharge Plan:  [place]  In-House Referral:     Discharge planning Services  CM Consult  Post Acute Care Choice:    Choice offered to:     DME Arranged:    DME Agency:     HH Arranged:    HH Agency:     Status of Service:  In process, will continue to follow  If discussed at Long Length of Stay Meetings, dates discussed:    Additional Comments:  Zenon Mayo, RN 12/15/2016, 9:08 AM

## 2016-12-15 NOTE — Progress Notes (Signed)
Called daugther's cellphone, pt's sister answered notified of new room number.

## 2016-12-15 NOTE — Progress Notes (Signed)
[facility] TEAM 1 - Stepdown/ICU TEAM  Duc Crocket  EYC:[id number] DOB: 1946/03/07 DOA: 12/13/2016 PCP: Mauricio Po, [name]    Brief Narrative:  71 y.o. male with history significant of insulin-dependent diabetes mellitus, hypertension, CVA, DVT no longer on anticoagulation, polysubstance abuse, and chronic kidney disease stage III who was brought to the emergency department after being found unresponsive at home by family. Patient's sister had spoken with him on the phone at approximately 4 PM, only to find him unresponsive in his apartment at approximately 8 PM. EMS was called and found his CBG to be < 20.  He was treated with an ampule of 50% dextrose with repeat CBG in the 100s. He was noted to be bradycardic in the 40s, increasing to the 60s after dextrose. Vitals were otherwise stable but the patient would not respond to noxious stimuli. His sisters acknowledged that he is addicted to "pain pills." He is only sporadically adherent with his insulin regimen. Patient has had hypoglycemic episodes in the past as well as multiple admissions for DKA.  His mental status did improve briefly after a dose of narcan in the ED.    Subjective: The pt is resting comfortably w/o any complaints today.    Assessment & Plan:  Hypoglycemia with coma, insulin-dependent DM   Appears to have been due to accidental insulin OD - CBG remains quite labile - adjust tx again today and follow trend   Acute kidney injury superimposed on CKD stage III  SCr 1.90 on admission - baseline range appears to be 1.2-2.0 - prerenal azotemia in setting of dehydration and hypotension   Hyperkalemia Should improve w/ tx of hyperglycemia - recheck in AM   Polysubstance abuse  Family reported "he has an addiction to pain pills" - Opiates noted on UDS but not on Rx med list - no evidence of withdrawal at this time   History of CVA  Continue ASA 81    History of DVT  Completed 5 mos anticoagulation and was taken off at time  of recent discharge secondary to poor adherence, frequent AMS and falls   Normocytic anemia  Hgb 8.6 on admission, stable relative to priors with no appreciable bleeding    Noncompliance   DVT prophylaxis: Subcutaneous heparin Code Status: FULL CODE Family Communication: no family present at time of exam today   Disposition Plan: Stable for transfer to medical bed - follow CBGs closely  Consultants:  none  Procedures: none  Antimicrobials:  Vanc 3/3 Zosyn 3/3  Objective: Blood pressure 135/69, pulse 76, temperature 98.4 F (36.9 C), temperature source Oral, resp. rate 18, height 5\' 9"  (1.753 m), weight 63.8 kg (140 lb 10.5 oz), SpO2 95 %.  Intake/Output Summary (Last 24 hours) at 12/15/16 1507 Last data filed at 12/15/16 1331  Gross per 24 hour  Intake             2000 ml  Output             5100 ml  Net            -3100 ml   Filed Weights   12/14/16 0100 12/14/16 0300  Weight: 63.8 kg (140 lb 10.5 oz) 63.8 kg (140 lb 10.5 oz)    Examination: General: No acute respiratory distress Lungs: Clear to auscultation bilaterally without wheezes or crackles Cardiovascular: Regular rate and rhythm without murmur gallop or rub normal S1 and S2 Abdomen: Nontender, nondistended, soft, bowel sounds positive, no rebound, no ascites, no appreciable mass Extremities: No  significant cyanosis, clubbing, or edema bilateral lower extremities   CBC:  Recent Labs Lab 12/13/16 2145 12/13/16 2151 12/14/16 0407 12/15/16 0302  WBC 11.2*  --  8.0 6.7  NEUTROABS 7.5  --  5.9  --   HGB 8.6* 9.2* 7.4* 8.7*  HCT 26.2* 27.0* 22.3* 27.6*  MCV 95.3  --  94.5 95.8  PLT 339  --  283 578   Basic Metabolic Panel:  Recent Labs Lab 12/13/16 2151 12/14/16 0112 12/15/16 0543  NA 136 136 135  K 5.0 5.3* 5.8*  CL 106 107 109  CO2  --  19* 17*  GLUCOSE 312* 153* 655*  BUN 39* 39* 36*  CREATININE 1.90* 2.03* 1.84*  CALCIUM  --  7.7* 8.1*   GFR: Estimated Creatinine Clearance: 33.2  mL/min (by C-G formula based on SCr of 1.84 mg/dL (H)).  Liver Function Tests:  Recent Labs Lab 12/14/16 0112  AST 25  ALT 27  ALKPHOS 142*  BILITOT 0.4  PROT 5.6*  ALBUMIN 2.8*    Recent Labs Lab 12/13/16 2146  AMMONIA 40*    Coagulation Profile:  Recent Labs Lab 12/14/16 0112  INR 0.97    Cardiac Enzymes:  Recent Labs Lab 12/13/16 2237 12/14/16 0112 12/14/16 0601 12/14/16 1240  CKTOTAL 51  --   --   --   TROPONINI  --  <0.03 <0.03 <0.03    HbA1C: Hgb A1c MFr Bld  Date/Time Value Ref Range Status  11/19/2016 08:37 AM 10.5 (H) 4.8 - 5.6 % Final    Comment:    (NOTE)         Pre-diabetes: 5.7 - 6.4         Diabetes: >6.4         Glycemic control for adults with diabetes: <7.0   08/17/2016 02:56 PM 8.9 (H) 4.8 - 5.6 % Final    Comment:    (NOTE)         Pre-diabetes: 5.7 - 6.4         Diabetes: >6.4         Glycemic control for adults with diabetes: <7.0     CBG:  Recent Labs Lab 12/14/16 0942 12/14/16 1125 12/14/16 1645 12/14/16 2058 12/15/16 1342  GLUCAP 64* 211* 214* 145* 300*    Recent Results (from the past 240 hour(s))  Blood Culture (routine x 2)     Status: None (Preliminary result)   Collection Time: 12/13/16  9:45 PM  Result Value Ref Range Status   Specimen Description BLOOD RIGHT THUMB  Final   Special Requests BOTTLES DRAWN AEROBIC AND ANAEROBIC 5CC EA  Final   Culture NO GROWTH 1 DAY  Final   Report Status PENDING  Incomplete  Blood Culture (routine x 2)     Status: None (Preliminary result)   Collection Time: 12/13/16 11:00 PM  Result Value Ref Range Status   Specimen Description BLOOD LEFT THUMB  Final   Special Requests IN PEDIATRIC BOTTLE 2CC  Final   Culture NO GROWTH 1 DAY  Final   Report Status PENDING  Incomplete  MRSA PCR Screening     Status: None   Collection Time: 12/14/16  1:57 AM  Result Value Ref Range Status   MRSA by PCR NEGATIVE NEGATIVE Final    Comment:        The GeneXpert MRSA Assay  (FDA approved for NASAL specimens only), is one component of a comprehensive MRSA colonization surveillance program. It is not intended to diagnose MRSA infection  nor to guide or monitor treatment for MRSA infections.      Scheduled Meds: . aspirin EC  81 mg Oral Daily  . FLUoxetine  20 mg Oral Daily  . heparin  5,000 Units Subcutaneous Q8H  . insulin aspart  0-15 Units Subcutaneous TID WC  . insulin aspart  0-5 Units Subcutaneous QHS  . insulin glargine  10 Units Subcutaneous Daily  . sodium chloride flush  3 mL Intravenous Q12H  . thiamine  100 mg Oral Daily     LOS: 2 days    Cherene Altes, MD [facility] Office  [phone] Pager - Text Page per Shea Evans as per below:  On-Call/Text Page:      [email or web address]      password [email or web address]  If 7PM-7AM, please contact night-coverage [email or web address] Password [email or web address] 12/15/2016, 3:07 PM

## 2016-12-16 DIAGNOSIS — G9341 Metabolic encephalopathy: Secondary | ICD-10-CM

## 2016-12-16 LAB — BASIC METABOLIC PANEL
Anion gap: 13 (ref 5–15)
BUN: 30 mg/dL — ABNORMAL HIGH (ref 6–20)
CO2: 21 mmol/L — ABNORMAL LOW (ref 22–32)
Calcium: 9.2 mg/dL (ref 8.9–10.3)
Chloride: 108 mmol/L (ref 101–111)
Creatinine, Ser: 1.41 mg/dL — ABNORMAL HIGH (ref 0.61–1.24)
GFR calc Af Amer: 56 mL/min — ABNORMAL LOW (ref >60)
GFR calc non Af Amer: 49 mL/min — ABNORMAL LOW (ref >60)
Glucose, Bld: 251 mg/dL — ABNORMAL HIGH (ref 65–99)
Potassium: 4.9 mmol/L (ref 3.5–5.1)
Sodium: 142 mmol/L (ref 135–145)

## 2016-12-16 LAB — GLUCOSE, CAPILLARY
Glucose-Capillary: 237 mg/dL — ABNORMAL HIGH (ref 65–99)
Glucose-Capillary: 213 mg/dL — ABNORMAL HIGH (ref 65–99)
Glucose-Capillary: 143 mg/dL — ABNORMAL HIGH (ref 65–99)
Glucose-Capillary: 172 mg/dL — ABNORMAL HIGH (ref 65–99)

## 2016-12-16 LAB — AMMONIA: Ammonia: 31 umol/L (ref 9–35)

## 2016-12-16 MED ORDER — INSULIN GLARGINE 100 UNIT/ML ~~LOC~~ SOLN
12.0000 [IU] | Freq: Every day | SUBCUTANEOUS | Status: DC
  Administered 2016-12-17: 12 [IU] via SUBCUTANEOUS
  Filled 2016-12-16: qty 0.12

## 2016-12-16 NOTE — Progress Notes (Signed)
Physical Therapy Treatment Patient Details Name: [name] [name] MRN: [id number] DOB: [birth year] Today's Date: 12/16/2016    History of Present Illness 71 y.o. male with history significant of insulin-dependent diabetes mellitus, hypertension, CVA, DVT no longer on anticoagulation, polysubstance abuse, and chronic kidney disease stage III who was brought to the emergency department after being found unresponsive at home by family.  CBG < 20 on arrival.    PT Comments    Pt admitted with/for hypoglycemia Coma.  Pt at min guard to supervision level at this time with sore knee from fall in hospital.  Pt currently limited functionally due to the problems listed below.  (see problems list.)  Pt will benefit from PT to maximize function and safety to be able to get home safely with available assist.    Follow Up Recommendations  Home health PT;Supervision - Intermittent     Equipment Recommendations       Recommendations for Other Services       Precautions / Restrictions Precautions Precautions: Fall Restrictions Weight Bearing Restrictions: No    Mobility  Bed Mobility Overal bed mobility: Needs Assistance Bed Mobility: Supine to Sit     Supine to sit: Supervision     General bed mobility comments: slow with minor struggle, but no use of the rail  Transfers Overall transfer level: Needs assistance   Transfers: Sit to/from Stand Sit to Stand: Supervision         General transfer comment: safe use of UE   Ambulation/Gait Ambulation/Gait assistance: Min assist Ambulation Distance (Feet): 80 Feet Assistive device: Rolling walker (2 wheeled) Gait Pattern/deviations: Step-through pattern Gait velocity: slower Gait velocity interpretation: Below normal speed for age/gender General Gait Details: antalgic gait on the L due to knee soreness from fall last night.   Stairs            Wheelchair Mobility    Modified Rankin (Stroke Patients Only)       Balance                                     Cognition Arousal/Alertness: Awake/alert Behavior During Therapy: WFL for tasks assessed/performed Overall Cognitive Status: Within Functional Limits for tasks assessed                      Exercises      General Comments        Pertinent Vitals/Pain Pain Assessment: Faces Faces Pain Scale: Hurts a little bit Pain Location: L knee Pain Descriptors / Indicators: Sore;Guarding;Grimacing Pain Intervention(s): Monitored during session    Home Living Family/patient expects to be discharged to:: Private residence Living Arrangements: Alone Available Help at Discharge: Family;Available PRN/intermittently Type of Home: Apartment Home Access: Level entry   Home Layout: One level Home Equipment: Walker - 2 wheels;Bedside commode      Prior Function        Comments: pt report independent without assistive device   PT Goals (current goals can now be found in the care plan section) Acute Rehab PT Goals Patient Stated Goal: home PT Goal Formulation: With patient Time For Goal Achievement: 12/23/16 Potential to Achieve Goals: Good    Frequency    Min 3X/week      PT Plan      Co-evaluation             End of Session   Activity Tolerance: Patient tolerated treatment well Patient  left: in bed;with call bell/phone within reach;with bed alarm set Nurse Communication: Mobility status PT Visit Diagnosis: Unsteadiness on feet (R26.81);Muscle weakness (generalized) (M62.81)     Time: 7867-5449 PT Time Calculation (min) (ACUTE ONLY): 18 min  Charges:                       G Codes:  Functional Limitation: Mobility: Walking and moving around    [employer] 12/16/2016, 11:09 AM 12/16/2016  Donnella Sham, PT [phone]  (pager)

## 2016-12-16 NOTE — Progress Notes (Signed)
PROGRESS NOTE    [name] [name]  NID:[id number] DOB: [birth year] DOA: 12/13/2016 PCP: Mauricio Po, [name]    Brief Narrative: 71 y.o.malewith history significant of insulin-dependent diabetes mellitus, hypertension, CVA, DVT no longer on anticoagulation, polysubstance abuse, and chronic kidney disease stage III who was brought to the emergency department after being found unresponsive at home by family. Patient's sister had spoken with him on the phone at approximately 4 PM, only to find him unresponsive in his apartment at approximately 8 PM. EMS was called and found his CBG to be <20. He was treated with an ampule of 50% dextrose with repeat CBG in the 100s. He was noted to be bradycardic in the 40s, increasing to the 60s after dextrose. Vitals were otherwise stable. Today he is alert and comfortable and is oriented to place and person.   Assessment & Plan:   Principal Problem:   Hypoglycemia, coma ([name]) Active Problems:   CKD (chronic kidney disease) stage 3, GFR 30-59 ml/min   Uncontrolled type 2 diabetes mellitus with diabetic nephropathy, with long-term current use of insulin ([name])   Depression   Narcotic dependency, continuous ([name])   Anxiety   History of CVA (cerebrovascular accident)   History of DVT (deep vein thrombosis)   Noncompliance with medication regimen   Encephalopathy, metabolic   Sepsis ([place])   AKI (acute kidney injury) ([place])   Hypoglycemia   Hypoglycemia: secondary to accidental overdose.  CBG (last 3)   Recent Labs  12/15/16 2042 12/16/16 0807 12/16/16 1111  GLUCAP 102* 237* 213*    Slightly elevated this am.  hgba1c is 10.5 in 11/1026 , elevated from 8.9 on 08/2016. Currently on 10 units of lantus and SSI ( moderate scale).    Uncontrolled DM:  Would increase the lantus to 12 units daily.  Resume the SSI.   Acute renal failure on CKD stage 3: improving. Creatinine at 1.4 at baseline.   Normocytic anemia:  Suspect from chronic disease anemia.    Monitor periodically.   Hyperkalemia: resolved.    Polysubstance abuse:  ? None on med list.    H/o CVA:  Resume aspirin.  No focal signs.    H/o DVT;  Completed treatment.    DVT prophylaxis: (heparin Code Status: (Full Family Communication: (none at bedside.  Disposition Plan: pending PT eval. Will need RN on discharge.    Consultants:   None.    Procedures: none.   Antimicrobials: none.   Subjective: No chest pain, sob or dizziness.   Objective: Vitals:   12/15/16 1941 12/15/16 2029 12/16/16 0424 12/16/16 0901  BP: 126/66 136/70 (!) 153/74 (!) 118/57  Pulse: 70 73 72 77  Resp: 18 18 17 16   Temp:  98.2 F (36.8 C) 98.5 F (36.9 C) 98.7 F (37.1 C)  TempSrc:  Oral Oral Oral  SpO2: 99% 100% 98% 98%  Weight:  61.3 kg (135 lb 2.3 oz)    Height:        Intake/Output Summary (Last 24 hours) at 12/16/16 1513 Last data filed at 12/16/16 1121  Gross per 24 hour  Intake             1443 ml  Output             1850 ml  Net             -407 ml   Filed Weights   12/14/16 0100 12/14/16 0300 12/15/16 2029  Weight: 63.8 kg (140 lb 10.5 oz) 63.8 kg (140 lb 10.5  oz) 61.3 kg (135 lb 2.3 oz)    Examination:  General exam: Appears calm and comfortable  Respiratory system: Clear to auscultation. Respiratory effort normal. Cardiovascular system: S1 & S2 heard, RRR. No JVD, murmurs, rubs, gallops or clicks. No pedal edema. Gastrointestinal system: Abdomen is nondistended, soft and nontender. No organomegaly or masses felt. Normal bowel sounds heard. Central nervous system: Alert and oriented to place and person. No focal neurological deficits. Extremities: Symmetric 5 x 5 power.     Data Reviewed: I have personally reviewed following labs and imaging studies  CBC:  Recent Labs Lab 12/13/16 2145 12/13/16 2151 12/14/16 0407 12/15/16 0302  WBC 11.2*  --  8.0 6.7  NEUTROABS 7.5  --  5.9  --   HGB 8.6* 9.2* 7.4* 8.7*  HCT 26.2* 27.0* 22.3* 27.6*  MCV  95.3  --  94.5 95.8  PLT 339  --  283 144   Basic Metabolic Panel:  Recent Labs Lab 12/13/16 2151 12/14/16 0112 12/15/16 0543 12/16/16 0536  NA 136 136 135 142  K 5.0 5.3* 5.8* 4.9  CL 106 107 109 108  CO2  --  19* 17* 21*  GLUCOSE 312* 153* 655* 251*  BUN 39* 39* 36* 30*  CREATININE 1.90* 2.03* 1.84* 1.41*  CALCIUM  --  7.7* 8.1* 9.2   GFR: Estimated Creatinine Clearance: 41.7 mL/min (by C-G formula based on SCr of 1.41 mg/dL (H)). Liver Function Tests:  Recent Labs Lab 12/14/16 0112  AST 25  ALT 27  ALKPHOS 142*  BILITOT 0.4  PROT 5.6*  ALBUMIN 2.8*   No results for input(s): LIPASE, AMYLASE in the last 168 hours.  Recent Labs Lab 12/13/16 2146  AMMONIA 40*   Coagulation Profile:  Recent Labs Lab 12/14/16 0112  INR 0.97   Cardiac Enzymes:  Recent Labs Lab 12/13/16 2237 12/14/16 0112 12/14/16 0601 12/14/16 1240  CKTOTAL 51  --   --   --   TROPONINI  --  <0.03 <0.03 <0.03   BNP (last 3 results) No results for input(s): PROBNP in the last 8760 hours. HbA1C: No results for input(s): HGBA1C in the last 72 hours. CBG:  Recent Labs Lab 12/15/16 1342 12/15/16 1620 12/15/16 2042 12/16/16 0807 12/16/16 1111  GLUCAP 300* 172* 102* 237* 213*   Lipid Profile: No results for input(s): CHOL, HDL, LDLCALC, TRIG, CHOLHDL, LDLDIRECT in the last 72 hours. Thyroid Function Tests:  Recent Labs  12/14/16 0112  TSH 1.663   Anemia Panel: No results for input(s): VITAMINB12, FOLATE, FERRITIN, TIBC, IRON, RETICCTPCT in the last 72 hours. Sepsis Labs:  Recent Labs Lab 12/13/16 2151 12/14/16 0036 12/14/16 0112 12/14/16 0407  PROCALCITON  --   --  1.55  --   LATICACIDVEN 2.36* 3.30* 3.5* 1.2    Recent Results (from the past 240 hour(s))  Blood Culture (routine x 2)     Status: None (Preliminary result)   Collection Time: 12/13/16  9:45 PM  Result Value Ref Range Status   Specimen Description BLOOD RIGHT THUMB  Final   Special Requests BOTTLES  DRAWN AEROBIC AND ANAEROBIC 5CC EA  Final   Culture NO GROWTH 2 DAYS  Final   Report Status PENDING  Incomplete  Blood Culture (routine x 2)     Status: None (Preliminary result)   Collection Time: 12/13/16 11:00 PM  Result Value Ref Range Status   Specimen Description BLOOD LEFT THUMB  Final   Special Requests IN PEDIATRIC BOTTLE [facility]  Final   Culture NO  GROWTH 2 DAYS  Final   Report Status PENDING  Incomplete  MRSA PCR Screening     Status: None   Collection Time: 12/14/16  1:57 AM  Result Value Ref Range Status   MRSA by PCR NEGATIVE NEGATIVE Final    Comment:        The GeneXpert MRSA Assay (FDA approved for NASAL specimens only), is one component of a comprehensive MRSA colonization surveillance program. It is not intended to diagnose MRSA infection nor to guide or monitor treatment for MRSA infections.          Radiology Studies: No results found.      Scheduled Meds: . aspirin EC  81 mg Oral Daily  . carvedilol  3.125 mg Oral BID WC  . FLUoxetine  20 mg Oral Daily  . heparin  5,000 Units Subcutaneous Q8H  . insulin aspart  0-15 Units Subcutaneous TID WC  . insulin aspart  0-5 Units Subcutaneous QHS  . insulin glargine  10 Units Subcutaneous Daily  . sodium chloride flush  3 mL Intravenous Q12H  . thiamine  100 mg Oral Daily   Continuous Infusions:   LOS: 3 days    Time spent: 30 minutes    Ilissa Rosner, MD [facility] Pager [phone]  If 7PM-7AM, please contact night-coverage [email or web address] Password [email or web address] 12/16/2016, 3:13 PM

## 2016-12-17 DIAGNOSIS — E15 Nondiabetic hypoglycemic coma: Secondary | ICD-10-CM

## 2016-12-17 DIAGNOSIS — N179 Acute kidney failure, unspecified: Secondary | ICD-10-CM

## 2016-12-17 DIAGNOSIS — E1121 Type 2 diabetes mellitus with diabetic nephropathy: Secondary | ICD-10-CM

## 2016-12-17 DIAGNOSIS — Z794 Long term (current) use of insulin: Secondary | ICD-10-CM

## 2016-12-17 DIAGNOSIS — N183 Chronic kidney disease, stage 3 (moderate): Secondary | ICD-10-CM

## 2016-12-17 DIAGNOSIS — E1165 Type 2 diabetes mellitus with hyperglycemia: Secondary | ICD-10-CM

## 2016-12-17 LAB — GLUCOSE, CAPILLARY
Glucose-Capillary: 116 mg/dL — ABNORMAL HIGH (ref 65–99)
Glucose-Capillary: 200 mg/dL — ABNORMAL HIGH (ref 65–99)
Glucose-Capillary: 232 mg/dL — ABNORMAL HIGH (ref 65–99)

## 2016-12-17 MED ORDER — INSULIN ASPART 100 UNIT/ML ~~LOC~~ SOLN: 0.0000 [IU] | SUBCUTANEOUS | Status: DC

## 2016-12-17 MED ORDER — INSULIN GLARGINE 100 UNIT/ML ~~LOC~~ SOLN: 5.0000 [IU] | Freq: Every day | SUBCUTANEOUS | Status: DC

## 2016-12-17 NOTE — Care Management Note (Signed)
Case Management Note  Patient Details  Name: [name] [name] MRN: [id number] Date of Birth: 1946-06-10  Subjective/Objective:      CM following for progression and d/c planning.               Action/Plan: 12/17/2016 Met with pt re [place] services, pt has no preferance and ask CM to assist with selection. As part of this CM role I have rotated [facility] agency for pt services and at this time [facility] would be the next available agency. This CM contacted Linus Mako, [facility] rep who states that they will be able to provide services for this pt .   Expected Discharge Date:  12/17/16               Expected Discharge Plan:  [place]  In-House Referral:     Discharge planning Services  CM Consult  Post Acute Care Choice:  Home Health Choice offered to:  Patient  DME Arranged:  N/A DME Agency:  NA  HH Arranged:  RN, PT [place] Agency:  [facility] (now [facility])  Status of Service:  Completed, signed off  If discussed at [place] of Stay Meetings, dates discussed:    Additional Comments:  Adron Bene, RN 12/17/2016, 2:54 PM

## 2016-12-17 NOTE — Care Management Important Message (Signed)
Important Message  Patient Details  Name: [name] [name] MRN: [id number] Date of Birth: 03-23-1946   Medicare Important Message Given:  Yes    Syesha Thaw [address] 12/17/2016, 12:56 PM

## 2016-12-17 NOTE — Progress Notes (Signed)
Physical Therapy Treatment Patient Details Name: [name] [name] MRN: [id number] DOB: [birth year] Today's Date: 12/17/2016    History of Present Illness 71 y.o. male with history significant of insulin-dependent diabetes mellitus, hypertension, CVA, DVT no longer on anticoagulation, polysubstance abuse, and chronic kidney disease stage III who was brought to the emergency department after being found unresponsive at home by family.  CBG < 20 on arrival.    PT Comments    Pt not as improved as had hoped due to L knee pain and unsteadiness.  Pt's family has agreed pt can come to their home for couple of days which will satisfy my safety concerns.    Follow Up Recommendations  Home health PT;Supervision - Intermittent;Other (comment) (going to his sisters' home for a couple days.)     Equipment Recommendations  None recommended by PT    Recommendations for Other Services       Precautions / Restrictions Precautions Precautions: Fall    Mobility  Bed Mobility Overal bed mobility: Needs Assistance Bed Mobility: Supine to Sit;Sit to Supine     Supine to sit: Supervision Sit to supine: Supervision   General bed mobility comments: boosted himself up in bed with little struggle  Transfers Overall transfer level: Needs assistance   Transfers: Sit to/from Stand Sit to Stand: Min guard         General transfer comment: L knee more painful and giving mild upon initial standing.  Ambulation/Gait Ambulation/Gait assistance: Min guard;Min assist Ambulation Distance (Feet): 80 Feet Assistive device: Rolling walker (2 wheeled) Gait Pattern/deviations: Step-through pattern Gait velocity: slower Gait velocity interpretation: Below normal speed for age/gender General Gait Details: Initially held to pt due to weak/painful L knee, but pt sloly accepted more weight on L LE and needed only min guard.  Gait remained antalgic and pt related increasing pain.   Stairs             Wheelchair Mobility    Modified Rankin (Stroke Patients Only)       Balance Overall balance assessment: Needs assistance Sitting-balance support: Bilateral upper extremity supported Sitting balance-Leahy Scale: Good       Standing balance-Leahy Scale: Poor Standing balance comment: RW for pain                    Cognition Arousal/Alertness: Awake/alert Behavior During Therapy: WFL for tasks assessed/performed Overall Cognitive Status: Within Functional Limits for tasks assessed                      Exercises      General Comments General comments (skin integrity, edema, etc.): Talked with pt's sister about pt's imminent d/c, his painful knee that I felt warranted at little extra family attension..  Pt's family finally agreed that pt could come to their home for couple days whild his L knee recovers.  Pt agreed.      Pertinent Vitals/Pain Pain Assessment: Faces Faces Pain Scale: Hurts even more Pain Location: L knee Pain Descriptors / Indicators: Sore;Guarding;Grimacing Pain Intervention(s): Monitored during session;Limited activity within patient's tolerance    Home Living                      Prior Function            PT Goals (current goals can now be found in the care plan section) Acute Rehab PT Goals Patient Stated Goal: home PT Goal Formulation: With patient Time For Goal Achievement: 12/23/16 Potential to  Achieve Goals: Good Progress towards PT goals: Progressing toward goals    Frequency    Min 3X/week      PT Plan Current plan remains appropriate;Discharge plan needs to be updated    Co-evaluation             End of Session   Activity Tolerance: Patient tolerated treatment well;Patient limited by pain Patient left: in bed;with call bell/phone within reach;with bed alarm set Nurse Communication: Mobility status PT Visit Diagnosis: Unsteadiness on feet (R26.81);Muscle weakness (generalized) (M62.81)      Time: 1629-1700 PT Time Calculation (min) (ACUTE ONLY): 31 min  Charges:  $Gait Training: 8-22 mins $Therapeutic Activity: 8-22 mins                    G CodesTessie Fass Ardella Chhim 12/17/2016, 5:58 PM 12/17/2016  Donnella Sham, [place] [phone]  (pager)

## 2016-12-17 NOTE — Progress Notes (Signed)
[name] [name] to be D/C'd Home per MD order.  Discussed prescriptions and follow up appointments with the patient. Prescriptions given to patient, medication list explained in detail. Pt verbalized understanding.  Allergies as of 12/17/2016   No Known Allergies     Medication List    TAKE these medications   acetaminophen 325 MG tablet Commonly known as:  TYLENOL Take 2 tablets (650 mg total) by mouth every 6 (six) hours as needed for mild pain (or Fever >/= 101).   amLODipine 10 MG tablet Commonly known as:  NORVASC Take 1 tablet (10 mg total) by mouth daily.   aspirin 81 MG EC tablet Take 1 tablet (81 mg total) by mouth daily.   carvedilol 6.25 MG tablet Commonly known as:  COREG Take 1 tablet (6.25 mg total) by mouth 2 (two) times daily with a meal.   cyanocobalamin 1000 MCG/ML injection Commonly known as:  (VITAMIN B-12) Inject 1 mL (1,000 mcg total) into the muscle every 30 (thirty) days.   FLUoxetine 20 MG capsule Commonly known as:  PROZAC Take 20 mg by mouth daily.   insulin aspart 100 UNIT/ML injection Commonly known as:  novoLOG Inject 0-15 Units into the skin See admin instructions. 0-15 Units, Subcutaneous, 3 times daily with meals CBG < 70: implement hypoglycemia protocol CBG 70 - 120: 0 units CBG 121 - 150: 2 units CBG 151 - 200: 3 units CBG 201 - 250: 5 units CBG 251 - 300: 8 units CBG 301 - 350: 11 units CBG 351 - 400: 15 units CBG > 400: call MD What changed:  how much to take  how to take this  when to take this   insulin glargine 100 UNIT/ML injection Commonly known as:  LANTUS Inject 0.05 mLs (5 Units total) into the skin daily.   polyethylene glycol packet Commonly known as:  MIRALAX / GLYCOLAX Take 17 g by mouth daily as needed (constipation). Mix in 8 oz liquid and drink   ramipril 10 MG capsule Commonly known as:  ALTACE Take 10 mg by mouth daily.   senna 8.6 MG Tabs tablet Commonly known as:  SENOKOT Take 1 tablet (8.6 mg total) by mouth 2  (two) times daily.   thiamine 100 MG tablet Take 1 tablet (100 mg total) by mouth daily.       Vitals:   12/17/16 0902 12/17/16 1730  BP: (!) 151/72 (!) 149/82  Pulse: (!) 54 70  Resp: 18 18  Temp: 98 F (36.7 C) 98 F (36.7 C)    Skin clean, dry and intact without evidence of skin break down, no evidence of skin tears noted. IV catheter discontinued intact. Site without signs and symptoms of complications. Dressing and pressure applied. Pt denies pain at this time. No complaints noted.  An After Visit Summary was printed and given to the patient. Patient escorted via [place], and D/C home via private auto.  Retta Mac BSN, RN

## 2016-12-17 NOTE — Discharge Summary (Signed)
Physician Discharge Summary  [name] [name] BZJ:[id number] DOB: 1946-01-11 DOA: 12/13/2016  PCP: Mauricio Po, [name]  Admit date: 12/13/2016 Discharge date: 12/17/2016  Admitted From: Home Disposition: Home   Recommendations for Outpatient Follow-up:  1. Follow up with PCP as soon as possible for insulin management.  Home Health: PT, RN Equipment/Devices: None Discharge Condition: Stable CODE STATUS: Full Diet recommendation: Carbohydrate-modified  Brief/Interim Summary: [name] [name] is a 71 y.o.malewith history significant of insulin-dependent diabetes mellitus, hypertension, CVA, DVT no longer on anticoagulation, polysubstance abuse, and chronic kidney disease stage III who was brought to the emergency department after being found unresponsive at home by family. Patient's sister had spoken with him on the phone at approximately 4 PM, only to find him unresponsive in his apartment at approximately 8 PM. EMS was called and found his CBG to be <20. He was treated with an ampule of 50% dextrose with repeat CBG in the 100s. He was noted to be bradycardic in the 40s, increasing to the 60s after dextrose. Vitals were otherwise stable. It was suspected that he unintentionally overdosed on insulin. Since admission, blood sugars have been stable without any hypoglycemia. Therapy evaluations recommended home health physical therapy and it is felt a home health RN would be beneficial to prevent recurrent insulin administration errors. He has remained stable and is clear for discharge with PCP follow up soon after discharge.   Discharge Diagnoses:  Principal Problem:   Hypoglycemia, coma ([place]) Active Problems:   CKD (chronic kidney disease) stage 3, GFR 30-59 ml/min   Uncontrolled type 2 diabetes mellitus with diabetic nephropathy, with long-term current use of insulin ([name])   Depression   Narcotic dependency, continuous ([name])   Anxiety   History of CVA (cerebrovascular accident)   History of DVT (deep  vein thrombosis)   Noncompliance with medication regimen   Encephalopathy, metabolic   Sepsis ([place])   AKI (acute kidney injury) ([place])   Hypoglycemia  Hypoglycemia: secondary to accidental insulin overdose. Lantus 10u daily + moderate SSI provided fair control. Pharmacy medication reconciliation shows that pt is prescribed lantus 5u daily + SSI TIDWC similar to moderate SSI. This was what he was discharged on.  - HH-RN will follow pt at home to prevent recurrent administration errors.   Uncontrolled DM: HbA1c 10.5%, elevated from 8.9 on 08/2016. - Will allow mild permissive hyperglycemia for now.  Acute renal failure on CKD stage 3: improving. Creatinine at 1.4 at baseline.   Normocytic anemia: Suspect from chronic disease anemia.  - Monitor periodically.   Hyperkalemia: resolved.   History of polysubstance abuse:  - None on med list.   H/o CVA: No focal signs.  - Resume aspirin.   H/o DVT: Completed treatment.   Discharge Instructions Discharge Instructions    Discharge instructions    Complete by:  As directed    You were admitted with low blood sugar (hypoglycemia) due to an overdose of insulin. You have stabilized and may be discharged with the following recommendations:  - Your blood sugar has been reasonably well controlled with lantus and small amounts of mealtime insulin based on the same sliding scale you were prescribed. There have been no low blood sugars on this regimen, so you are discharged with orders to take lantus 5 units (this was reported by your pharmacy) daily and to take novolog (short-acting insulin) 3 times daily with meals according to the sliding scale. This may not provide perfect control of blood sugar but it will not cause hypoglycemia and will keep  you from going into DKA.  - You will receive home health services including physical therapy and a nurse who can monitor your blood sugars and help you improve control of diabetes.  - If you have any  questions about how much insulin to take, call your doctor before you inject anything.  - Schedule a follow up appointment with your doctor as soon as possible to discuss insulin dosing. - If your symptoms return, seek medical care right away.     Allergies as of 12/17/2016   No Known Allergies     Medication List    TAKE these medications   acetaminophen 325 MG tablet Commonly known as:  TYLENOL Take 2 tablets (650 mg total) by mouth every 6 (six) hours as needed for mild pain (or Fever >/= 101).   amLODipine 10 MG tablet Commonly known as:  NORVASC Take 1 tablet (10 mg total) by mouth daily.   aspirin 81 MG EC tablet Take 1 tablet (81 mg total) by mouth daily.   carvedilol 6.25 MG tablet Commonly known as:  COREG Take 1 tablet (6.25 mg total) by mouth 2 (two) times daily with a meal.   cyanocobalamin 1000 MCG/ML injection Commonly known as:  (VITAMIN B-12) Inject 1 mL (1,000 mcg total) into the muscle every 30 (thirty) days.   FLUoxetine 20 MG capsule Commonly known as:  PROZAC Take 20 mg by mouth daily.   insulin aspart 100 UNIT/ML injection Commonly known as:  novoLOG Inject 0-15 Units into the skin See admin instructions. 0-15 Units, Subcutaneous, 3 times daily with meals CBG < 70: implement hypoglycemia protocol CBG 70 - 120: 0 units CBG 121 - 150: 2 units CBG 151 - 200: 3 units CBG 201 - 250: 5 units CBG 251 - 300: 8 units CBG 301 - 350: 11 units CBG 351 - 400: 15 units CBG > 400: call MD What changed:  how much to take  how to take this  when to take this   insulin glargine 100 UNIT/ML injection Commonly known as:  LANTUS Inject 0.05 mLs (5 Units total) into the skin daily.   polyethylene glycol packet Commonly known as:  MIRALAX / GLYCOLAX Take 17 g by mouth daily as needed (constipation). Mix in 8 oz liquid and drink   ramipril 10 MG capsule Commonly known as:  ALTACE Take 10 mg by mouth daily.   senna 8.6 MG Tabs tablet Commonly known as:   SENOKOT Take 1 tablet (8.6 mg total) by mouth 2 (two) times daily.   thiamine 100 MG tablet Take 1 tablet (100 mg total) by mouth daily.      Follow-up Information    Mauricio Po, [name]. Schedule an appointment as soon as possible for a visit.   Specialty:  Family Medicine Contact information: [place] [address] [phone]          No Known Allergies  Consultations:  None  Procedures/Studies: Ct Head Wo Contrast  Result Date: 12/13/2016 CLINICAL DATA:  Possible seizure, acute onset. Patient found unresponsive. Initial encounter. EXAM: CT HEAD WITHOUT CONTRAST TECHNIQUE: Contiguous axial images were obtained from the base of the skull through the vertex without intravenous contrast. COMPARISON:  CT of the head performed 10/01/2016 FINDINGS: Brain: No evidence of acute infarction, hemorrhage, hydrocephalus, extra-axial collection or mass lesion/mass effect. Prominence of the ventricles and sulci reflects mild cortical volume loss. Mild cerebellar atrophy is noted. Scattered periventricular white matter change likely reflects small vessel ischemic microangiopathy. The brainstem  and fourth ventricle are within normal limits. The basal ganglia are unremarkable in appearance. The cerebral hemispheres demonstrate grossly normal gray-white differentiation. No mass effect or midline shift is seen. Vascular: No hyperdense vessel or unexpected calcification. Skull: There is no evidence of fracture; visualized osseous structures are unremarkable in appearance. Sinuses/Orbits: The orbits are within normal limits. There is mild partial opacification of the left maxillary sinus. The remaining paranasal sinuses and mastoid air cells are well-aerated. Other: No significant soft tissue abnormalities are seen. IMPRESSION: 1. No acute intracranial pathology seen on CT. 2. Mild cortical volume loss and scattered small vessel ischemic microangiopathy. 3. Mild partial opacification of the  left maxillary sinus. Electronically Signed   By: Garald Balding M.D.   On: 12/13/2016 22:46   Dg Chest Portable 1 View  Result Date: 12/13/2016 CLINICAL DATA:  Seizure. EXAM: PORTABLE CHEST 1 VIEW COMPARISON:  Radiographs 11/16/2016 FINDINGS: Lower lung volumes from prior exam. There is mild cardiomegaly. Mild vascular congestion and cephalization of pulmonary vasculature. No focal airspace disease, pleural effusion or pneumothorax. No acute osseous abnormalities are seen. Degenerative change in both shoulders. IMPRESSION: Mild cardiomegaly and vascular congestion. This is new from prior exam. Electronically Signed   By: Jeb Levering M.D.   On: 12/13/2016 22:26   US Abdomen Limited Ruq  Result Date: 11/20/2016 CLINICAL DATA:  Elevated LFTs, hypertension, diabetes mellitus EXAM: US ABDOMEN LIMITED - RIGHT UPPER QUADRANT COMPARISON:  CT abdomen pelvis 07/06/2016 FINDINGS: Gallbladder: Incompletely distended with borderline wall thickening; patient reportedly NPO since 2300 hours last night. No gallstones, definite pericholecystic fluid or sonographic Murphy sign. Common bile duct: Diameter: 5 mm diameter, normal Liver: Echogenic, likely fatty infiltration, though this can be seen with cirrhosis and certain infiltrative disorders. No focal hepatic mass or nodularity. Hepatopetal portal venous flow. Trace RIGHT upper quadrant ascites in perihepatic region. IMPRESSION: Incompletely distended gallbladder with borderline gallbladder wall thickening, nonspecific. Probable fatty infiltration of liver. Trace ascites. Electronically Signed   By: Lavonia Dana M.D.   On: 11/20/2016 08:09   Subjective: Pt without complaints. Denies symptoms of hypoglycemia. Was not sure what insulin he takes at home. Does not know how his blood sugar got so low.   Discharge Exam: Vitals:   12/17/16 0530 12/17/16 0902  BP: (!) 143/82 (!) 151/72  Pulse: 65 (!) 54  Resp: 16 18  Temp: 98.1 F (36.7 C) 98 F (36.7 C)    General: Pt is alert, awake, not in acute distress Cardiovascular: RRR, S1/S2 +, no rubs, no gallops Respiratory: CTA bilaterally, no wheezing, no rhonchi Abdominal: Soft, NT, ND, bowel sounds + Extremities: No edema, no cyanosis  The results of significant diagnostics from this hospitalization (including imaging, microbiology, ancillary and laboratory) are listed below for reference.    Labs: Basic Metabolic Panel:  Recent Labs Lab 12/13/16 2151 12/14/16 0112 12/15/16 0543 12/16/16 0536  NA 136 136 135 142  K 5.0 5.3* 5.8* 4.9  CL 106 107 109 108  CO2  --  19* 17* 21*  GLUCOSE 312* 153* 655* 251*  BUN 39* 39* 36* 30*  CREATININE 1.90* 2.03* 1.84* 1.41*  CALCIUM  --  7.7* 8.1* 9.2   Liver Function Tests:  Recent Labs Lab 12/14/16 0112  AST 25  ALT 27  ALKPHOS 142*  BILITOT 0.4  PROT 5.6*  ALBUMIN 2.8*    Recent Labs Lab 12/13/16 2146 12/16/16 1613  AMMONIA 40* 31   CBC:  Recent Labs Lab 12/13/16 2145 12/13/16 2151 12/14/16 0407 12/15/16  0302  WBC 11.2*  --  8.0 6.7  NEUTROABS 7.5  --  5.9  --   HGB 8.6* 9.2* 7.4* 8.7*  HCT 26.2* 27.0* 22.3* 27.6*  MCV 95.3  --  94.5 95.8  PLT 339  --  283 321   Cardiac Enzymes:  Recent Labs Lab 12/13/16 2237 12/14/16 0112 12/14/16 0601 12/14/16 1240  CKTOTAL 51  --   --   --   TROPONINI  --  <0.03 <0.03 <0.03   CBG:  Recent Labs Lab 12/16/16 1111 12/16/16 1644 12/16/16 2056 12/17/16 0724 12/17/16 1210  GLUCAP 213* 143* 172* 116* 200*   Urinalysis    Component Value Date/Time   COLORURINE YELLOW 12/13/2016 2140   APPEARANCEUR CLEAR 12/13/2016 2140   LABSPEC 1.013 12/13/2016 2140   PHURINE 5.0 12/13/2016 2140   GLUCOSEU >=500 (A) 12/13/2016 2140   HGBUR NEGATIVE 12/13/2016 2140   [place] NEGATIVE 12/13/2016 2140   [place] NEGATIVE 12/13/2016 2140   PROTEINUR 30 (A) 12/13/2016 2140   UROBILINOGEN 0.2 08/21/2015 2154   NITRITE NEGATIVE 12/13/2016 2140   LEUKOCYTESUR NEGATIVE  12/13/2016 2140    Microbiology Recent Results (from the past 240 hour(s))  Blood Culture (routine x 2)     Status: None (Preliminary result)   Collection Time: 12/13/16  9:45 PM  Result Value Ref Range Status   Specimen Description BLOOD RIGHT THUMB  Final   Special Requests BOTTLES DRAWN AEROBIC AND ANAEROBIC 5CC EA  Final   Culture NO GROWTH 3 DAYS  Final   Report Status PENDING  Incomplete  Blood Culture (routine x 2)     Status: None (Preliminary result)   Collection Time: 12/13/16 11:00 PM  Result Value Ref Range Status   Specimen Description BLOOD LEFT THUMB  Final   Special Requests IN PEDIATRIC BOTTLE 2CC  Final   Culture NO GROWTH 3 DAYS  Final   Report Status PENDING  Incomplete  MRSA PCR Screening     Status: None   Collection Time: 12/14/16  1:57 AM  Result Value Ref Range Status   MRSA by PCR NEGATIVE NEGATIVE Final    Comment:        The GeneXpert MRSA Assay (FDA approved for NASAL specimens only), is one component of a comprehensive MRSA colonization surveillance program. It is not intended to diagnose MRSA infection nor to guide or monitor treatment for MRSA infections.     Time coordinating discharge: Approximately 40 minutes  Vance Gather, MD  [facility] 12/17/2016, 3:32 PM Pager [phone]

## 2016-12-19 ENCOUNTER — Telehealth: Payer: Self-pay | Admitting: Family

## 2016-12-19 LAB — CULTURE, BLOOD (ROUTINE X 2)
Culture: NO GROWTH
Culture: NO GROWTH

## 2016-12-19 NOTE — Telephone Encounter (Signed)
[facility] called and said they are going to be delayed getting out to see this patient. They will not be able to see him until Tuesday. Is that ok with you? Or do you want to offer another place to the patient.   Follow up with Darlina Guys [phone]

## 2016-12-22 NOTE — Telephone Encounter (Signed)
Called and gave verbal ok for pt to be seen tomorrow

## 2016-12-22 NOTE — Telephone Encounter (Signed)
Called back today.  States can see patient tomorrow, 3/13

## 2016-12-24 ENCOUNTER — Other Ambulatory Visit: Payer: Self-pay | Admitting: Family

## 2016-12-28 ENCOUNTER — Inpatient Hospital Stay (HOSPITAL_COMMUNITY)
Admission: EM | Admit: 2016-12-28 | Discharge: 2016-12-30 | DRG: 637 | Disposition: A | Discharge status: Home Health | Payer: Medicare HMO | Attending: Internal Medicine | Admitting: Internal Medicine

## 2016-12-28 ENCOUNTER — Encounter (HOSPITAL_COMMUNITY): Payer: Self-pay | Admitting: Emergency Medicine

## 2016-12-28 ENCOUNTER — Inpatient Hospital Stay (HOSPITAL_COMMUNITY): Payer: Medicare HMO

## 2016-12-28 ENCOUNTER — Emergency Department (HOSPITAL_COMMUNITY): Payer: Medicare HMO

## 2016-12-28 DIAGNOSIS — Z820 Family history of epilepsy and other diseases of the nervous system: Secondary | ICD-10-CM

## 2016-12-28 DIAGNOSIS — R4701 Aphasia: Secondary | ICD-10-CM | POA: Diagnosis present

## 2016-12-28 DIAGNOSIS — N185 Chronic kidney disease, stage 5: Secondary | ICD-10-CM | POA: Diagnosis present

## 2016-12-28 DIAGNOSIS — E875 Hyperkalemia: Secondary | ICD-10-CM | POA: Diagnosis present

## 2016-12-28 DIAGNOSIS — N183 Chronic kidney disease, stage 3 (moderate): Secondary | ICD-10-CM | POA: Diagnosis present

## 2016-12-28 DIAGNOSIS — Z794 Long term (current) use of insulin: Secondary | ICD-10-CM

## 2016-12-28 DIAGNOSIS — Z833 Family history of diabetes mellitus: Secondary | ICD-10-CM

## 2016-12-28 DIAGNOSIS — Z79899 Other long term (current) drug therapy: Secondary | ICD-10-CM | POA: Diagnosis not present

## 2016-12-28 DIAGNOSIS — E1010 Type 1 diabetes mellitus with ketoacidosis without coma: Secondary | ICD-10-CM

## 2016-12-28 DIAGNOSIS — R7989 Other specified abnormal findings of blood chemistry: Secondary | ICD-10-CM

## 2016-12-28 DIAGNOSIS — I959 Hypotension, unspecified: Secondary | ICD-10-CM | POA: Diagnosis present

## 2016-12-28 DIAGNOSIS — E871 Hypo-osmolality and hyponatremia: Secondary | ICD-10-CM | POA: Diagnosis present

## 2016-12-28 DIAGNOSIS — I9589 Other hypotension: Secondary | ICD-10-CM | POA: Diagnosis present

## 2016-12-28 DIAGNOSIS — G9341 Metabolic encephalopathy: Secondary | ICD-10-CM | POA: Diagnosis present

## 2016-12-28 DIAGNOSIS — Z7982 Long term (current) use of aspirin: Secondary | ICD-10-CM | POA: Diagnosis not present

## 2016-12-28 DIAGNOSIS — I129 Hypertensive chronic kidney disease with stage 1 through stage 4 chronic kidney disease, or unspecified chronic kidney disease: Secondary | ICD-10-CM | POA: Diagnosis present

## 2016-12-28 DIAGNOSIS — E1310 Other specified diabetes mellitus with ketoacidosis without coma: Secondary | ICD-10-CM | POA: Diagnosis not present

## 2016-12-28 DIAGNOSIS — E1110 Type 2 diabetes mellitus with ketoacidosis without coma: Principal | ICD-10-CM | POA: Diagnosis present

## 2016-12-28 DIAGNOSIS — Z87891 Personal history of nicotine dependence: Secondary | ICD-10-CM

## 2016-12-28 DIAGNOSIS — D638 Anemia in other chronic diseases classified elsewhere: Secondary | ICD-10-CM | POA: Diagnosis present

## 2016-12-28 DIAGNOSIS — E1122 Type 2 diabetes mellitus with diabetic chronic kidney disease: Secondary | ICD-10-CM | POA: Diagnosis present

## 2016-12-28 DIAGNOSIS — Z91138 Patient's unintentional underdosing of medication regimen for other reason: Secondary | ICD-10-CM

## 2016-12-28 DIAGNOSIS — Z8673 Personal history of transient ischemic attack (TIA), and cerebral infarction without residual deficits: Secondary | ICD-10-CM | POA: Diagnosis not present

## 2016-12-28 DIAGNOSIS — Y92009 Unspecified place in unspecified non-institutional (private) residence as the place of occurrence of the external cause: Secondary | ICD-10-CM

## 2016-12-28 DIAGNOSIS — E538 Deficiency of other specified B group vitamins: Secondary | ICD-10-CM | POA: Diagnosis present

## 2016-12-28 DIAGNOSIS — N179 Acute kidney failure, unspecified: Secondary | ICD-10-CM | POA: Diagnosis present

## 2016-12-28 DIAGNOSIS — Z96641 Presence of right artificial hip joint: Secondary | ICD-10-CM | POA: Diagnosis present

## 2016-12-28 DIAGNOSIS — I452 Bifascicular block: Secondary | ICD-10-CM | POA: Diagnosis present

## 2016-12-28 DIAGNOSIS — T383X6A Underdosing of insulin and oral hypoglycemic [antidiabetic] drugs, initial encounter: Secondary | ICD-10-CM | POA: Diagnosis present

## 2016-12-28 DIAGNOSIS — E872 Acidosis: Secondary | ICD-10-CM | POA: Diagnosis not present

## 2016-12-28 DIAGNOSIS — E860 Dehydration: Secondary | ICD-10-CM | POA: Diagnosis present

## 2016-12-28 DIAGNOSIS — D72829 Elevated white blood cell count, unspecified: Secondary | ICD-10-CM | POA: Diagnosis present

## 2016-12-28 DIAGNOSIS — Z8249 Family history of ischemic heart disease and other diseases of the circulatory system: Secondary | ICD-10-CM

## 2016-12-28 DIAGNOSIS — N178 Other acute kidney failure: Secondary | ICD-10-CM | POA: Diagnosis not present

## 2016-12-28 DIAGNOSIS — E870 Hyperosmolality and hypernatremia: Secondary | ICD-10-CM | POA: Diagnosis present

## 2016-12-28 LAB — CBG MONITORING, ED
Glucose-Capillary: >600 mg/dL (ref 65–99)
Glucose-Capillary: >600 mg/dL (ref 65–99)
Glucose-Capillary: >600 mg/dL (ref 65–99)
Glucose-Capillary: >600 mg/dL (ref 65–99)
Glucose-Capillary: 496 mg/dL — ABNORMAL HIGH (ref 65–99)
Glucose-Capillary: 484 mg/dL — ABNORMAL HIGH (ref 65–99)
Glucose-Capillary: 392 mg/dL — ABNORMAL HIGH (ref 65–99)
Glucose-Capillary: 285 mg/dL — ABNORMAL HIGH (ref 65–99)
Glucose-Capillary: 216 mg/dL — ABNORMAL HIGH (ref 65–99)

## 2016-12-28 LAB — GLUCOSE, CAPILLARY
Glucose-Capillary: 156 mg/dL — ABNORMAL HIGH (ref 65–99)
Glucose-Capillary: 95 mg/dL (ref 65–99)
Glucose-Capillary: 91 mg/dL (ref 65–99)
Glucose-Capillary: 96 mg/dL (ref 65–99)
Glucose-Capillary: 79 mg/dL (ref 65–99)
Glucose-Capillary: 86 mg/dL (ref 65–99)
Glucose-Capillary: 87 mg/dL (ref 65–99)

## 2016-12-28 LAB — BASIC METABOLIC PANEL
Anion gap: 19 — ABNORMAL HIGH (ref 5–15)
Anion gap: 7 (ref 5–15)
Anion gap: 9 (ref 5–15)
BUN: 70 mg/dL — ABNORMAL HIGH (ref 6–20)
BUN: 63 mg/dL — ABNORMAL HIGH (ref 6–20)
BUN: 59 mg/dL — ABNORMAL HIGH (ref 6–20)
CO2: 10 mmol/L — ABNORMAL LOW (ref 22–32)
CO2: 22 mmol/L (ref 22–32)
CO2: 22 mmol/L (ref 22–32)
Calcium: 8 mg/dL — ABNORMAL LOW (ref 8.9–10.3)
Calcium: 8.6 mg/dL — ABNORMAL LOW (ref 8.9–10.3)
Calcium: 8.4 mg/dL — ABNORMAL LOW (ref 8.9–10.3)
Chloride: 104 mmol/L (ref 101–111)
Chloride: 113 mmol/L — ABNORMAL HIGH (ref 101–111)
Chloride: 109 mmol/L (ref 101–111)
Creatinine, Ser: 4.18 mg/dL — ABNORMAL HIGH (ref 0.61–1.24)
Creatinine, Ser: 3.27 mg/dL — ABNORMAL HIGH (ref 0.61–1.24)
Creatinine, Ser: 3.08 mg/dL — ABNORMAL HIGH (ref 0.61–1.24)
GFR calc Af Amer: 15 mL/min — ABNORMAL LOW (ref >60)
GFR calc Af Amer: 20 mL/min — ABNORMAL LOW (ref >60)
GFR calc Af Amer: 22 mL/min — ABNORMAL LOW (ref >60)
GFR calc non Af Amer: 13 mL/min — ABNORMAL LOW (ref >60)
GFR calc non Af Amer: 18 mL/min — ABNORMAL LOW (ref >60)
GFR calc non Af Amer: 19 mL/min — ABNORMAL LOW (ref >60)
Glucose, Bld: 820 mg/dL (ref 65–99)
Glucose, Bld: 129 mg/dL — ABNORMAL HIGH (ref 65–99)
Glucose, Bld: 90 mg/dL (ref 65–99)
Potassium: 4.7 mmol/L (ref 3.5–5.1)
Potassium: 3.8 mmol/L (ref 3.5–5.1)
Potassium: 3.9 mmol/L (ref 3.5–5.1)
Sodium: 133 mmol/L — ABNORMAL LOW (ref 135–145)
Sodium: 142 mmol/L (ref 135–145)
Sodium: 140 mmol/L (ref 135–145)

## 2016-12-28 LAB — CBC WITH DIFFERENTIAL/PLATELET
Basophils Absolute: 0 10*3/uL (ref 0.0–0.1)
Basophils Absolute: 0 10*3/uL (ref 0.0–0.1)
Basophils Relative: 0 %
Basophils Relative: 0 %
Eosinophils Absolute: 0 10*3/uL (ref 0.0–0.7)
Eosinophils Absolute: 0 10*3/uL (ref 0.0–0.7)
Eosinophils Relative: 0 %
Eosinophils Relative: 0 %
HCT: 26.7 % — ABNORMAL LOW (ref 39.0–52.0)
HCT: 20.2 % — ABNORMAL LOW (ref 39.0–52.0)
Hemoglobin: 8 g/dL — ABNORMAL LOW (ref 13.0–17.0)
Hemoglobin: 6.6 g/dL — CL (ref 13.0–17.0)
Lymphocytes Relative: 8 %
Lymphocytes Relative: 13 %
Lymphs Abs: 0.9 10*3/uL (ref 0.7–4.0)
Lymphs Abs: 1.5 10*3/uL (ref 0.7–4.0)
MCH: 32.8 pg (ref 26.0–34.0)
MCH: 31.3 pg (ref 26.0–34.0)
MCHC: 30 g/dL (ref 30.0–36.0)
MCHC: 32.7 g/dL (ref 30.0–36.0)
MCV: 109.4 fL — ABNORMAL HIGH (ref 78.0–100.0)
MCV: 95.7 fL (ref 78.0–100.0)
Monocytes Absolute: 0.6 10*3/uL (ref 0.1–1.0)
Monocytes Absolute: 1.3 10*3/uL — ABNORMAL HIGH (ref 0.1–1.0)
Monocytes Relative: 5 %
Monocytes Relative: 12 %
Neutro Abs: 9.6 10*3/uL — ABNORMAL HIGH (ref 1.7–7.7)
Neutro Abs: 8.7 10*3/uL — ABNORMAL HIGH (ref 1.7–7.7)
Neutrophils Relative %: 87 %
Neutrophils Relative %: 75 %
Platelets: 286 10*3/uL (ref 150–400)
Platelets: 234 10*3/uL (ref 150–400)
RBC: 2.44 MIL/uL — ABNORMAL LOW (ref 4.22–5.81)
RBC: 2.11 MIL/uL — ABNORMAL LOW (ref 4.22–5.81)
RDW: 16.1 % — ABNORMAL HIGH (ref 11.5–15.5)
RDW: 15.4 % (ref 11.5–15.5)
WBC: 11.1 10*3/uL — ABNORMAL HIGH (ref 4.0–10.5)
WBC: 11.5 10*3/uL — ABNORMAL HIGH (ref 4.0–10.5)

## 2016-12-28 LAB — I-STAT CG4 LACTIC ACID, ED
Lactic Acid, Venous: 4.42 mmol/L (ref 0.5–1.9)
Lactic Acid, Venous: 5.13 mmol/L (ref 0.5–1.9)

## 2016-12-28 LAB — BLOOD GAS, ARTERIAL
Drawn by: 308601
O2 Content: 4 L/min
O2 Saturation: 97.9 %
Patient temperature: 97.3
pH, Arterial: 7.123 — CL (ref 7.350–7.450)
pO2, Arterial: 148 mmHg — ABNORMAL HIGH (ref 83.0–108.0)

## 2016-12-28 LAB — COMPREHENSIVE METABOLIC PANEL
ALT: 14 U/L — ABNORMAL LOW (ref 17–63)
AST: 17 U/L (ref 15–41)
Albumin: 3.2 g/dL — ABNORMAL LOW (ref 3.5–5.0)
Alkaline Phosphatase: 115 U/L (ref 38–126)
BUN: 74 mg/dL — ABNORMAL HIGH (ref 6–20)
CO2: <7 mmol/L — ABNORMAL LOW (ref 22–32)
Calcium: 9.2 mg/dL (ref 8.9–10.3)
Chloride: 89 mmol/L — ABNORMAL LOW (ref 101–111)
Creatinine, Ser: 4.46 mg/dL — ABNORMAL HIGH (ref 0.61–1.24)
GFR calc Af Amer: 14 mL/min — ABNORMAL LOW (ref >60)
GFR calc non Af Amer: 12 mL/min — ABNORMAL LOW (ref >60)
Glucose, Bld: 1426 mg/dL (ref 65–99)
Potassium: 7.4 mmol/L (ref 3.5–5.1)
Sodium: 127 mmol/L — ABNORMAL LOW (ref 135–145)
Total Bilirubin: 2 mg/dL — ABNORMAL HIGH (ref 0.3–1.2)
Total Protein: 5.9 g/dL — ABNORMAL LOW (ref 6.5–8.1)

## 2016-12-28 LAB — URINALYSIS, ROUTINE W REFLEX MICROSCOPIC
Bilirubin Urine: NEGATIVE
Glucose, UA: >=500 mg/dL — AB
Ketones, ur: 20 mg/dL — AB
Leukocytes, UA: NEGATIVE
Nitrite: NEGATIVE
Protein, ur: 30 mg/dL — AB
Specific Gravity, Urine: 1.015 (ref 1.005–1.030)
pH: 5 (ref 5.0–8.0)

## 2016-12-28 LAB — I-STAT CHEM 8, ED
BUN: 66 mg/dL — ABNORMAL HIGH (ref 6–20)
Calcium, Ion: 1.16 mmol/L (ref 1.15–1.40)
Chloride: 100 mmol/L — ABNORMAL LOW (ref 101–111)
Creatinine, Ser: 3.8 mg/dL — ABNORMAL HIGH (ref 0.61–1.24)
Glucose, Bld: >700 mg/dL (ref 65–99)
HCT: 22 % — ABNORMAL LOW (ref 39.0–52.0)
Hemoglobin: 7.5 g/dL — ABNORMAL LOW (ref 13.0–17.0)
Potassium: 5.5 mmol/L — ABNORMAL HIGH (ref 3.5–5.1)
Sodium: 130 mmol/L — ABNORMAL LOW (ref 135–145)
TCO2: 8 mmol/L (ref 0–100)

## 2016-12-28 LAB — I-STAT TROPONIN, ED: Troponin i, poc: 0 ng/mL (ref 0.00–0.08)

## 2016-12-28 LAB — LIPASE, BLOOD: Lipase: 21 U/L (ref 11–51)

## 2016-12-28 LAB — SODIUM, URINE, RANDOM: Sodium, Ur: 23 mmol/L

## 2016-12-28 LAB — LACTIC ACID, PLASMA: Lactic Acid, Venous: 5.7 mmol/L (ref 0.5–1.9)

## 2016-12-28 LAB — PREPARE RBC (CROSSMATCH)

## 2016-12-28 LAB — CREATININE, URINE, RANDOM: Creatinine, Urine: 84.62 mg/dL

## 2016-12-28 LAB — HEMOGLOBIN AND HEMATOCRIT, BLOOD
HCT: 23.9 % — ABNORMAL LOW (ref 39.0–52.0)
Hemoglobin: 8.4 g/dL — ABNORMAL LOW (ref 13.0–17.0)

## 2016-12-28 MED ORDER — SODIUM CHLORIDE 0.9 % IV SOLN
INTRAVENOUS | Status: DC
  Administered 2016-12-28: 06:00:00 via INTRAVENOUS
  Administered 2016-12-29 – 2016-12-30 (×2): 1000 mL via INTRAVENOUS

## 2016-12-28 MED ORDER — DOPAMINE-DEXTROSE 1.6-5 MG/ML-% IV SOLN: 2.0000 ug/kg/min | Freq: Once | INTRAVENOUS | Status: DC

## 2016-12-28 MED ORDER — SODIUM CHLORIDE 0.9 % IV BOLUS (SEPSIS)
1000.0000 mL | Freq: Once | INTRAVENOUS | Status: AC
  Administered 2016-12-28: 1000 mL via INTRAVENOUS

## 2016-12-28 MED ORDER — DEXTROSE-NACL 5-0.45 % IV SOLN
INTRAVENOUS | Status: DC
  Administered 2016-12-28: 17:00:00 via INTRAVENOUS

## 2016-12-28 MED ORDER — INSULIN GLARGINE 100 UNIT/ML ~~LOC~~ SOLN
5.0000 [IU] | Freq: Every day | SUBCUTANEOUS | Status: DC
  Filled 2016-12-28: qty 0.05

## 2016-12-28 MED ORDER — STERILE WATER FOR INJECTION IV SOLN
INTRAVENOUS | Status: DC
  Administered 2016-12-28 (×2): via INTRAVENOUS
  Filled 2016-12-28 (×3): qty 9.71

## 2016-12-28 MED ORDER — ENOXAPARIN SODIUM 30 MG/0.3ML ~~LOC~~ SOLN
30.0000 mg | SUBCUTANEOUS | Status: DC
  Administered 2016-12-28 – 2016-12-29 (×2): 30 mg via SUBCUTANEOUS
  Filled 2016-12-28 (×3): qty 0.3

## 2016-12-28 MED ORDER — INSULIN ASPART 100 UNIT/ML ~~LOC~~ SOLN
0.0000 [IU] | Freq: Three times a day (TID) | SUBCUTANEOUS | Status: DC
  Administered 2016-12-29: 8 [IU] via SUBCUTANEOUS

## 2016-12-28 MED ORDER — SODIUM CHLORIDE 0.9 % IV SOLN
Freq: Once | INTRAVENOUS | Status: AC
  Administered 2016-12-28: 11:00:00 via INTRAVENOUS

## 2016-12-28 MED ORDER — SODIUM CHLORIDE 0.9 % IV SOLN
INTRAVENOUS | Status: DC
  Administered 2016-12-28: 5 [IU]/h via INTRAVENOUS
  Filled 2016-12-28: qty 2.5

## 2016-12-28 MED ORDER — INSULIN GLARGINE 100 UNIT/ML ~~LOC~~ SOLN
5.0000 [IU] | Freq: Every day | SUBCUTANEOUS | Status: DC
  Administered 2016-12-28 – 2016-12-30 (×3): 5 [IU] via SUBCUTANEOUS
  Filled 2016-12-28 (×3): qty 0.05

## 2016-12-28 NOTE — ED Notes (Signed)
Ultrasound bedside.

## 2016-12-28 NOTE — ED Notes (Signed)
Made Critical [name] [name] aware of repeat lactic and Glucose level. Was Informed that internal medicine will be admitting patient and will need to let them know the results.

## 2016-12-28 NOTE — ED Notes (Signed)
Critical value glucose 1426 reported to Corliss Skains, RN and Dr. Roxanne Mins.

## 2016-12-28 NOTE — ED Notes (Signed)
He is drowsy and easily arouseable; and has no c/o pain or discomfort. He cooperates for v.s./assessment. Monitor shows nsr. His IVF are infusing via distal port of triple-lumen. Blood products are being administered via proximal port. Dr. Talmadge Coventry is aware of 2nd lactic acid result.

## 2016-12-28 NOTE — ED Notes (Signed)
He remains in no distress. His daughter has just gone home "for some rest". I encourage her to come back or call at any time, and that he would most likely be moving to ICU sometime this evening.

## 2016-12-28 NOTE — Progress Notes (Addendum)
RN notified NP of most recent BMET results. Orders received. RN notified NP of CBG of 79, orders to increase maintenance gtt rate received. Will continue to monitor.

## 2016-12-28 NOTE — ED Notes (Signed)
HGB: 6.6, C Brislin, RN notified.

## 2016-12-28 NOTE — H&P (Signed)
History and Physical    Andrew Blasius HGD:[id number] DOB: 1946-01-03 DOA: 12/28/2016  PCP: Mauricio Po, [name]  Patient coming from: Home.   I have personally briefly reviewed patient's old medical records in [place]  Chief Complaint : sob and chest discomfort.   HPI: [name] [name] is a 71 y.o. male with medical history significant of prior CVA, multiple admissions to the hospital for DKA, diabetes mellitus, hypertension and anemia was brought by EMS after her sister found him breathing heavily. On arrival to ED he was found to be lethargic, afebrile, hypotensive,  denied any chest pain or sob. His speech is incomprehensible  and as per his daughter at bedside, that's his baseline speech. Not much history is avaiable from the patient as he is encephalopathic. Daughter at bedside, provided most of the history . As per the daughter, patient is non compliant to insulin regimen, he lives by himself and doesn't administer insulin, . The daughter says  She and rest of the family do not know how much and how to administer insulin and they were not given clear instructions.  Also noted that patient was admitted last week to [facility] for acute encephalopathy from accidental insulin overdose. Further lab work in ED, revealed sodium of 127, potassium of 7.4, bicarb less than 7 and creatinine of 4.46, and his glucose of 1426. As per the daughter his cbg's sometimes have been as high as 1600. Lactic acid came elevated at 5.13. Hemoglobin of 7.5. cxr is negative for acute cardio pulmonary disease, UA shows glucosuria, and ketones , no signs of infection. PCCm was initially consulted for admission and they deferred to medical service for admission. He will be admitted to step down for further evaluation and management of DKA.     Review of Systems: As per HPI otherwise 10 point review of systems negative.    Past Medical History:  Diagnosis Date  . 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 Closed comminuted intertrochanteric fracture of left femur ([place])   . Diabetes mellitus without complication ([place])   . Diabetic hyperosmolar non-ketotic state ([place]) 05/15/2016  . DKA (diabetic ketoacidoses) ([place]) 05/09/2016  . Hypertension   . Postoperative anemia due to acute blood loss 06/18/2016  . Retroperitoneal hematoma 06/18/2016  . Vitamin B 12 deficiency 06/18/2016    Past Surgical History:  Procedure Laterality Date  . HIP ARTHROPLASTY Right 02/05/2013   Procedure: ARTHROPLASTY BIPOLAR HIP;  Surgeon: [name], MD;  Location: [facility];  Service: Orthopedics;  Laterality: Right;  . INTRAMEDULLARY (IM) NAIL INTERTROCHANTERIC Left 06/16/2016   Procedure: INTRAMEDULLARY (IM) NAIL INTERTROCHANTRIC;  Surgeon: Rod Can, MD;  Location: [place];  Service: Orthopedics;  Laterality: Left;     reports that he quit smoking about 3 years ago. His smoking use included Cigarettes. He has a 7.50 pack-year smoking history. He has never used smokeless tobacco. He reports that he does not drink alcohol or use drugs.  No Known Allergies  Family History  Problem Relation Age of Onset  . Diabetes Mother   . Alzheimer's disease Mother   . Hypertension Mother   . Hyperlipidemia Father   . Hypertension Father   . Healthy Maternal Grandmother   . Pneumonia Maternal Grandfather   COULD NOT REVIEWED, AS PT IS LETHARGIC.   Prior to Admission medications   Medication Sig Start Date End Date Taking? Authorizing Provider  amLODipine (NORVASC) 10 MG tablet Take 1 tablet (10 mg total) by  mouth daily. 08/14/15  Yes Robbie Lis, MD  aspirin EC 81 MG EC tablet Take 1 tablet (81 mg total) by mouth daily. 11/20/16  Yes Shanker Kristeen Mans, MD  carvedilol (COREG) 6.25 MG tablet Take 1 tablet (6.25 mg total) by mouth 2 (two) times daily with a meal. 05/12/16  Yes Thurnell Lose, MD  FLUoxetine (PROZAC) 20 MG capsule Take 20 mg by mouth daily.   Yes Historical Provider, MD    glucose blood (TRUE METRIX BLOOD GLUCOSE TEST) test strip Use to check blood sugars three times a day Dx E11.8 12/24/16  Yes Golden Circle, [name]  insulin aspart (NOVOLOG) 100 UNIT/ML injection Inject 0-15 Units into the skin See admin instructions. 0-15 Units, Subcutaneous, 3 times daily with meals CBG < 70: implement hypoglycemia protocol CBG 70 - 120: 0 units CBG 121 - 150: 2 units CBG 151 - 200: 3 units CBG 201 - 250: 5 units CBG 251 - 300: 8 units CBG 301 - 350: 11 units CBG 351 - 400: 15 units CBG > 400: call MD 12/17/16  Yes Patrecia Pour, MD  ramipril (ALTACE) 10 MG capsule Take 10 mg by mouth daily. 11/11/16  Yes Historical Provider, MD  acetaminophen (TYLENOL) 325 MG tablet Take 2 tablets (650 mg total) by mouth every 6 (six) hours as needed for mild pain (or Fever >/= 101). Patient not taking: Reported on 12/14/2016 08/14/15   Robbie Lis, MD  cyanocobalamin (,VITAMIN B-12,) 1000 MCG/ML injection Inject 1 mL (1,000 mcg total) into the muscle every 30 (thirty) days. Patient not taking: Reported on 11/16/2016 06/20/16   Venetia Maxon Rama, MD  insulin glargine (LANTUS) 100 UNIT/ML injection Inject 0.05 mLs (5 Units total) into the skin daily. Patient not taking: Reported on 12/28/2016 12/17/16   Patrecia Pour, MD  senna (SENOKOT) 8.6 MG TABS tablet Take 1 tablet (8.6 mg total) by mouth 2 (two) times daily. Patient not taking: Reported on 12/28/2016 06/20/16   Venetia Maxon Rama, MD  thiamine 100 MG tablet Take 1 tablet (100 mg total) by mouth daily. Patient not taking: Reported on 12/28/2016 11/03/15   Nat Math, MD    Physical Exam: Vitals:   12/28/16 0845 12/28/16 0900 12/28/16 0915 12/28/16 0945  BP: (!) 87/44 (!) 93/50 (!) 90/46 (!) 96/45  Pulse: 69 67 65 67  Resp: (!) 23 (!) 22 (!) 22 (!) 22  Temp:      TempSrc:      SpO2: 100% 99% 96% 99%  Weight:      Height:        Constitutional: NAD, calm, comfortable Vitals:   12/28/16 0845 12/28/16 0900 12/28/16 0915 12/28/16 0945  BP:  (!) 87/44 (!) 93/50 (!) 90/46 (!) 96/45  Pulse: 69 67 65 67  Resp: (!) 23 (!) 22 (!) 22 (!) 22  Temp:      TempSrc:      SpO2: 100% 99% 96% 99%  Weight:      Height:       Eyes: PERRL, lids and conjunctivae normal ENMT: Mucous membranes are DRY. Posterior pharynx clear of any exudate or lesions.Normal dentition.  Neck: normal, supple, no masses, no thyromegaly Respiratory: clear to auscultation bilaterally, no wheezing, no crackles. Normal respiratory effort. No accessory muscle use.  Cardiovascular: Regular rate and rhythm, no murmurs / rubs / gallops. No extremity edema. 2+ pedal pulses. No carotid bruits.  Abdomen: no tenderness, no masses palpated. No hepatosplenomegaly. Bowel sounds positive.  Musculoskeletal: no  clubbing / cyanosis. No joint deformity upper and lower extremities. Good ROM, no contractures. Normal muscle tone.  Skin: no rashes, lesions, ulcers. No induration Neurologic: lethargic.     Labs on Admission: I have personally reviewed following labs and imaging studies  CBC:  Recent Labs Lab 12/28/16 0430 12/28/16 0631 12/28/16 0954  WBC 11.1*  --  11.5*  NEUTROABS 9.6*  --  8.7*  HGB 8.0* 7.5* 6.6*  HCT 26.7* 22.0* 20.2*  MCV 109.4*  --  95.7  PLT 286  --  119   Basic Metabolic Panel:  Recent Labs Lab 12/28/16 0530 12/28/16 0631  NA 127* 130*  K 7.4* 5.5*  CL 89* 100*  CO2 <7*  --   GLUCOSE 1,426* >700*  BUN 74* 66*  CREATININE 4.46* 3.80*  CALCIUM 9.2  --    GFR: Estimated Creatinine Clearance: 14.8 mL/min (A) (by C-G formula based on SCr of 3.8 mg/dL (H)). Liver Function Tests:  Recent Labs Lab 12/28/16 0530  AST 17  ALT 14*  ALKPHOS 115  BILITOT 2.0*  PROT 5.9*  ALBUMIN 3.2*    Recent Labs Lab 12/28/16 0530  LIPASE 21   No results for input(s): AMMONIA in the last 168 hours. Coagulation Profile: No results for input(s): INR, PROTIME in the last 168 hours. Cardiac Enzymes: No results for input(s): CKTOTAL, CKMB,  CKMBINDEX, TROPONINI in the last 168 hours. BNP (last 3 results) No results for input(s): PROBNP in the last 8760 hours. HbA1C: No results for input(s): HGBA1C in the last 72 hours. CBG:  Recent Labs Lab 12/28/16 0416 12/28/16 0626 12/28/16 0951  GLUCAP >600* >600* >600*   Lipid Profile: No results for input(s): CHOL, HDL, LDLCALC, TRIG, CHOLHDL, LDLDIRECT in the last 72 hours. Thyroid Function Tests: No results for input(s): TSH, T4TOTAL, FREET4, T3FREE, THYROIDAB in the last 72 hours. Anemia Panel: No results for input(s): VITAMINB12, FOLATE, FERRITIN, TIBC, IRON, RETICCTPCT in the last 72 hours. Urine analysis:    Component Value Date/Time   COLORURINE YELLOW 12/28/2016 0557   APPEARANCEUR CLOUDY (A) 12/28/2016 0557   LABSPEC 1.015 12/28/2016 0557   PHURINE 5.0 12/28/2016 0557   GLUCOSEU >=500 (A) 12/28/2016 0557   HGBUR SMALL (A) 12/28/2016 0557   BILIRUBINUR NEGATIVE 12/28/2016 0557   KETONESUR 20 (A) 12/28/2016 0557   PROTEINUR 30 (A) 12/28/2016 0557   UROBILINOGEN 0.2 08/21/2015 2154   NITRITE NEGATIVE 12/28/2016 0557   LEUKOCYTESUR NEGATIVE 12/28/2016 0557    Radiological Exams on Admission: Dg Chest Portable 1 View  Result Date: 12/28/2016 CLINICAL DATA:  Chest pain, hyperglycemia, hypotension. History of diabetes and hypertension. Former smoker. EXAM: PORTABLE CHEST 1 VIEW COMPARISON:  12/13/2016 FINDINGS: Shallow inspiration. Normal heart size and pulmonary vascularity. No focal airspace disease or consolidation in the lungs. No blunting of costophrenic angles. No pneumothorax. Mediastinal contours appear intact. IMPRESSION: No active disease. Electronically Signed   By: Lucienne Capers M.D.   On: 12/28/2016 06:03    EKG: Independently reviewed. Sinus rhythm  Assessment/Plan Active Problems:   DKA (diabetic ketoacidoses) ([place])   DKA:  Admit to stepdown on gluco stabilizer, BMP every 4 hours, till AG and bicarb normalizes.  DKA secondary to non compliant  to lantus.  IV fluids, bicarb drip, cbg's every hour.  Trend lactic acid.  Check magnesium and phos levels.    Anemia of chronic disease:  His hemoglobin dropped from 7.5 to 6.6 suspect a combination of hemodilution from fluid boluses and chronic disease. Anemia panel last month shows adequate  iron and ferritin levels. 1 unit of prbc transfusion ordered and repeat H&H 3 hours post transfusion.    Acute renal failure and metabolic acidosis, suspect from DKA, and dehydration.  Fluid boluses and maintenance fluids at 120ml/hr.  Get UA, US RENAL to evaluate for obstructive causes.    Acute encephalopathy: metabolic from DKA.  Improving.    Hypotension : Secondary to DKA, dehydration.  Keep Map>65, fluid boluses as needed.  No source of infection Holding all BP meds at this time.    H/o CVA with speech abnormalities Resume aspirin.    Mild leukocytosis:  No source of infection found.   Hyponatremia and hyperkalemia: from DKA, correction of DKA shows correct the electrolytes.   DVT prophylaxis: lovenox. Code Status: full code.  Family Communication: discussed with daughter at bedside.  Disposition Plan: pending further eval Consults called: none Admission status: inpatient/ sdu   Chenille Toor MD [facility] Pager [phone]  If 7PM-7AM, please contact night-coverage [email or web address] Password [facility]  12/28/2016, 10:25 AM

## 2016-12-28 NOTE — ED Provider Notes (Signed)
[facility] DEPT Provider Note   CSN: [id number] Arrival date & time: 12/28/16  0411     History   Chief Complaint Chief Complaint  Patient presents with  . Hypotension  . Hyperglycemia    HPI [name] [name] is a 71 y.o. male  has a past medical history of Cerebral infarction due to thrombosis of right posterior cerebral artery ([place]) (06/08/2015); Closed comminuted intertrochanteric fracture of left femur ([place]); Diabetes mellitus without complication ([place]); Diabetic hyperosmolar non-ketotic state ([place]) (05/15/2016); DKA (diabetic ketoacidoses) ([place]) (05/09/2016); Hypertension; Postoperative anemia due to acute blood loss (06/18/2016); Retroperitoneal hematoma (06/18/2016); and Vitamin B 12 deficiency (06/18/2016). BIB EMS for hyperglycemia. Patient was at a "friend's" house and began breathing heavy. He had some vomiting. Patient may have been complaining of chest pain earlier, according to EMS, however, he denies any current chest pain. Patient states he did not take insulin today. He denies any alcohol ingestion. She has a history of aphasia and is difficult to understand.   HPI  Past Medical History:  Diagnosis Date  . Cerebral infarction due to thrombosis of right posterior cerebral artery ([place]) 06/08/2015  . Closed comminuted intertrochanteric fracture of left femur ([place])   . Diabetes mellitus without complication ([place])   . Diabetic hyperosmolar non-ketotic state ([place]) 05/15/2016  . DKA (diabetic ketoacidoses) ([place]) 05/09/2016  . Hypertension   . Postoperative anemia due to acute blood loss 06/18/2016  . Retroperitoneal hematoma 06/18/2016  . Vitamin B 12 deficiency 06/18/2016    Patient Active Problem List   Diagnosis Date Noted  . AKI (acute kidney injury) ([place]) 12/14/2016  . Hypoglycemia   . Sepsis ([place]) 12/13/2016  . Uncontrolled type 2 diabetes mellitus with complication ([place])   . Encephalopathy, metabolic   . Noncompliance with medication regimen 08/17/2016  . History of DVT (deep  vein thrombosis) 07/06/2016  . Retroperitoneal hematoma 06/18/2016  . Vitamin B 12 deficiency 06/18/2016  . Anxiety 05/15/2016  . History of CVA (cerebrovascular accident) 05/15/2016  . Hypoglycemia, coma ([place]) 11/02/2015  . Narcotic dependency, continuous ([place]) 11/02/2015  . Uncontrolled type 2 diabetes mellitus with diabetic nephropathy, with long-term current use of insulin ([place]) 08/12/2015  . Depression 08/12/2015  . CKD (chronic kidney disease) stage 3, GFR 30-59 ml/min 03/20/2014    Past Surgical History:  Procedure Laterality Date  . HIP ARTHROPLASTY Right 02/05/2013   Procedure: ARTHROPLASTY BIPOLAR HIP;  Surgeon: [name], MD;  [facility]: [facility];  Service: Orthopedics;  Laterality: Right;  . INTRAMEDULLARY (IM) NAIL INTERTROCHANTERIC Left 06/16/2016   Procedure: INTRAMEDULLARY (IM) NAIL INTERTROCHANTRIC;  Surgeon: Rod Can, MD;  Location: [facility];  Service: Orthopedics;  Laterality: Left;       Home Medications    Prior to Admission medications   Medication Sig Start Date End Date Taking? Authorizing Provider  acetaminophen (TYLENOL) 325 MG tablet Take 2 tablets (650 mg total) by mouth every 6 (six) hours as needed for mild pain (or Fever >/= 101). Patient not taking: Reported on 12/14/2016 08/14/15   Robbie Lis, MD  amLODipine (NORVASC) 10 MG tablet Take 1 tablet (10 mg total) by mouth daily. 08/14/15   Robbie Lis, MD  aspirin EC 81 MG EC tablet Take 1 tablet (81 mg total) by mouth daily. 11/20/16   Shanker Kristeen Mans, MD  carvedilol (COREG) 6.25 MG tablet Take 1 tablet (6.25 mg total) by mouth 2 (two) times daily with a meal. 05/12/16   Thurnell Lose, MD  cyanocobalamin (,VITAMIN B-12,) 1000 MCG/ML injection  Inject 1 mL (1,000 mcg total) into the muscle every 30 (thirty) days. Patient not taking: Reported on 11/16/2016 06/20/16   Venetia Maxon Rama, MD  FLUoxetine (PROZAC) 20 MG capsule Take 20 mg by mouth daily.    Historical Provider, MD  glucose blood (TRUE METRIX  BLOOD GLUCOSE TEST) test strip Use to check blood sugars three times a day Dx E11.8 12/24/16   Golden Circle, [name]  insulin aspart (NOVOLOG) 100 UNIT/ML injection Inject 0-15 Units into the skin See admin instructions. 0-15 Units, Subcutaneous, 3 times daily with meals CBG < 70: implement hypoglycemia protocol CBG 70 - 120: 0 units CBG 121 - 150: 2 units CBG 151 - 200: 3 units CBG 201 - 250: 5 units CBG 251 - 300: 8 units CBG 301 - 350: 11 units CBG 351 - 400: 15 units CBG > 400: call MD 12/17/16   Patrecia Pour, MD  insulin glargine (LANTUS) 100 UNIT/ML injection Inject 0.05 mLs (5 Units total) into the skin daily. 12/17/16   Patrecia Pour, MD  polyethylene glycol [facility] / Floria Raveling) packet Take 17 g by mouth daily as needed (constipation). Mix in 8 oz liquid and drink    Historical Provider, MD  ramipril (ALTACE) 10 MG capsule Take 10 mg by mouth daily. 11/11/16   Historical Provider, MD  senna (SENOKOT) 8.6 MG TABS tablet Take 1 tablet (8.6 mg total) by mouth 2 (two) times daily. 06/20/16   Venetia Maxon Rama, MD  thiamine 100 MG tablet Take 1 tablet (100 mg total) by mouth daily. 11/03/15   Nat Math, MD    Family History Family History  Problem Relation Age of Onset  . Diabetes Mother   . Alzheimer's disease Mother   . Hypertension Mother   . Hyperlipidemia Father   . Hypertension Father   . Healthy Maternal Grandmother   . Pneumonia Maternal Grandfather     Social History Social History  Substance Use Topics  . Smoking status: Former Smoker    Packs/day: 0.25    Years: 30.00    Types: Cigarettes    Quit date: 01/31/2013  . Smokeless tobacco: Never Used  . Alcohol use No     Allergies   Patient has no known allergies.   Review of Systems Review of Systems  Ten systems reviewed and are negative for acute change, except as noted in the HPI.   Physical Exam Updated Vital Signs BP (!) 85/47   Pulse 75   Temp 97.3 F (36.3 C) (Oral)   Resp (!) 25   Ht 5\' 9"  (1.753  m)   Wt 58.5 kg   SpO2 100%   BMI 19.05 kg/m   Physical Exam  Constitutional: He is oriented to person, place, and time. He appears well-developed. No distress.  Thin, chronically ill-appearing male, appears older than stated age.  HENT:  Head: Normocephalic and atraumatic.  Eyes: Conjunctivae are normal. No scleral icterus.  Neck: Normal range of motion. Neck supple.  Cardiovascular: Normal rate, regular rhythm and normal heart sounds.   Pulmonary/Chest: No respiratory distress. He has no wheezes.  Kussmaul breathing  Abdominal: Soft. There is no tenderness.  Musculoskeletal: He exhibits no edema.  Neurological: He is alert and oriented to person, place, and time.  Skin: Skin is warm and dry. He is not diaphoretic.  Psychiatric: His behavior is normal.  Nursing note and vitals reviewed.    ED Treatments / Results  Labs (all labs ordered are listed, but only abnormal results  are displayed) Labs Reviewed  CBC WITH DIFFERENTIAL/PLATELET - Abnormal; Notable for the following:       Result Value   WBC 11.1 (*)    RBC 2.44 (*)    Hemoglobin 8.0 (*)    HCT 26.7 (*)    MCV 109.4 (*)    RDW 16.1 (*)    Neutro Abs 9.6 (*)    All other components within normal limits  URINALYSIS, ROUTINE W REFLEX MICROSCOPIC - Abnormal; Notable for the following:    APPearance CLOUDY (*)    Glucose, UA >=500 (*)    Hgb urine dipstick SMALL (*)    Ketones, ur 20 (*)    Protein, ur 30 (*)    Bacteria, UA RARE (*)    Squamous Epithelial / LPF 0-5 (*)    All other components within normal limits  BLOOD GAS, ARTERIAL - Abnormal; Notable for the following:    pH, Arterial 7.123 (*)    pO2, Arterial 148 (*)    All other components within normal limits  COMPREHENSIVE METABOLIC PANEL - Abnormal; Notable for the following:    Sodium 127 (*)    Potassium 7.4 (*)    Chloride 89 (*)    CO2 <7 (*)    BUN 74 (*)    Creatinine, Ser 4.46 (*)    Total Protein 5.9 (*)    Albumin 3.2 (*)    ALT 14 (*)     Total Bilirubin 2.0 (*)    GFR calc non Af Amer 12 (*)    GFR calc Af Amer 14 (*)    All other components within normal limits  I-STAT CHEM 8, ED - Abnormal; Notable for the following:    Sodium 130 (*)    Potassium 5.5 (*)    Chloride 100 (*)    BUN 66 (*)    Creatinine, Ser 3.80 (*)    Glucose, Bld >700 (*)    Hemoglobin 7.5 (*)    HCT 22.0 (*)    All other components within normal limits  I-STAT CG4 LACTIC ACID, ED - Abnormal; Notable for the following:    Lactic Acid, Venous 4.42 (*)    All other components within normal limits  CBG MONITORING, ED - Abnormal; Notable for the following:    Glucose-Capillary >600 (*)    All other components within normal limits  CBG MONITORING, ED - Abnormal; Notable for the following:    Glucose-Capillary >600 (*)    All other components within normal limits  LIPASE, BLOOD  I-STAT TROPOININ, ED  I-STAT CG4 LACTIC ACID, ED    EKG  EKG Interpretation  Date/Time:  Sunday December 28 2016 04:19:39 EDT Ventricular Rate:  72 PR Interval:    QRS Duration: 120 QT Interval:  450 QTC Calculation: 493 R Axis:   -84 Text Interpretation:  Sinus rhythm Borderline prolonged PR interval Incomplete RBBB and LAFB Anteroseptal infarct, age indeterminate When compared with ECG of 12/13/2016, T wave inversion Anterior leads is no longer present Confirmed by [facility]  MD, DAVID ([id number]) on 12/28/2016 4:41:53 AM       Radiology Dg Chest Portable 1 View  Result Date: 12/28/2016 CLINICAL DATA:  Chest pain, hyperglycemia, hypotension. History of diabetes and hypertension. Former smoker. EXAM: PORTABLE CHEST 1 VIEW COMPARISON:  12/13/2016 FINDINGS: Shallow inspiration. Normal heart size and pulmonary vascularity. No focal airspace disease or consolidation in the lungs. No blunting of costophrenic angles. No pneumothorax. Mediastinal contours appear intact. IMPRESSION: No active disease. Electronically Signed  By: Lucienne Capers M.D.   On: 12/28/2016 06:03     Procedures .Critical Care Performed by: Margarita Mail Authorized by: Margarita Mail   Critical care provider statement:    Critical care time (minutes):  35   Critical care was necessary to treat or prevent imminent or life-threatening deterioration of the following conditions:  Metabolic crisis   Critical care was time spent personally by me on the following activities:  Review of old charts, re-evaluation of patient's condition, ordering and review of radiographic studies, ordering and review of laboratory studies, ordering and performing treatments and interventions, obtaining history from patient or surrogate, interpretation of cardiac output measurements, examination of patient, evaluation of patient's response to treatment, discussions with consultants and development of treatment plan with patient or surrogate   (including critical care time)  Medications Ordered in ED Medications  insulin regular (NOVOLIN R,HUMULIN R) 250 Units in sodium chloride 0.9 % 250 mL (1 Units/mL) infusion (5 Units/hr Intravenous New Bag/Given 12/28/16 0542)  sodium chloride 0.9 % bolus 1,000 mL (1,000 mLs Intravenous New Bag/Given 12/28/16 0558)    And  sodium chloride 0.9 % bolus 1,000 mL (0 mLs Intravenous Stopped 12/28/16 0619)    And  0.9 %  sodium chloride infusion ( Intravenous New Bag/Given 12/28/16 0340)     Initial Impression / Assessment and Plan / ED Course  I have reviewed the triage vital signs and the nursing notes.  Pertinent labs & imaging results that were available during my care of the patient were reviewed by me and considered in my medical decision making (see chart for details).  Clinical Course as of Dec 28 699  Sun Dec 28, 2016  0445 Although patient is noted to be hypotensive with an elevated lactic acid and white count, I doubt sepsis as the source. I discussed this with Dr. Roxanne Mins. I suspect his lactic acidosis is secondary to severe dehydration and DKA. Patient is  afebrile. We'll continue to bother the patient closely Lactic Acid, Venous: (!!) 4.42 [AH]  [id number] Patient chem 8 will not cross over, however, his sodium is 123, k is 7.5 and cl is 95 giving him an anion gap of 35.5 by my calculation. The patient does not have peaked T waves and is receiving insulin and his pressures are improving  [AH]  0633 Chem 8 redrawn. Current potassium is 5.5  [AH]    Clinical Course User Index [AH] Margarita Mail, PA-C      Final Clinical Impressions(s) / ED Diagnoses   Final diagnoses:  Diabetic ketoacidosis without coma associated with type 2 diabetes mellitus [facility])    New Prescriptions New Prescriptions   No medications on file     Margarita Mail, PA-C 35/24/81 [id number]    Delora Fuel, MD 93/11/21 6244

## 2016-12-28 NOTE — ED Triage Notes (Signed)
PT presents from EMS for evaluation of hyperglycemia and hypotension. Pt currently denies any complaints at this time. Pt alert and oriented to person, place, and year.

## 2016-12-28 NOTE — Progress Notes (Signed)
RN notified NP of last two CBGs and most recent BMET results. RN notified that GlucoseStabilizer ordered for insulin gtt to be turned off, due to current CBG result. Awaiting orders for final transitioning. Will continue to monitor.

## 2016-12-28 NOTE — ED Notes (Signed)
RN AND MD NOTIFIED OF PATIENT'S K LEVEL OF 7.5 PATIENT ALSO HAS A LACTIC LEVEL OF 4.42

## 2016-12-28 NOTE — ED Notes (Signed)
Dr.Glick at bedside to perform STAT Central Line due to unsuccessful attempts at IV access and infiltrated Ultrasound IV by Dr.Glick in right bicep and Infiltrated IV in left forearm from EMS. Family aware of procedure.

## 2016-12-28 NOTE — ED Notes (Signed)
Repeat CBG of HI. Dr.Glick to be notified.

## 2016-12-28 NOTE — ED Notes (Signed)
Bed: WA13 Expected date:  Expected time:  Means of arrival:  Comments: 

## 2016-12-28 NOTE — ED Notes (Signed)
Made Dr Roxanne Mins aware of Lactic acid increase to 5.13 and Glucose came back at 1426.  Was verbally told to start another liter of NS.

## 2016-12-28 NOTE — ED Notes (Signed)
Central line placed into right femoral with positive blood return after being placed by Dr.Glick.

## 2016-12-28 NOTE — ED Notes (Signed)
Bed: RESB Expected date:  Expected time:  Means of arrival:  Comments: 71 yo M/ Hyperglycemic

## 2016-12-29 DIAGNOSIS — R7989 Other specified abnormal findings of blood chemistry: Secondary | ICD-10-CM

## 2016-12-29 DIAGNOSIS — E872 Acidosis: Secondary | ICD-10-CM

## 2016-12-29 DIAGNOSIS — N183 Chronic kidney disease, stage 3 (moderate): Secondary | ICD-10-CM

## 2016-12-29 DIAGNOSIS — E1310 Other specified diabetes mellitus with ketoacidosis without coma: Secondary | ICD-10-CM

## 2016-12-29 DIAGNOSIS — N179 Acute kidney failure, unspecified: Secondary | ICD-10-CM

## 2016-12-29 DIAGNOSIS — G9341 Metabolic encephalopathy: Secondary | ICD-10-CM

## 2016-12-29 LAB — GLUCOSE, CAPILLARY
Glucose-Capillary: 298 mg/dL — ABNORMAL HIGH (ref 65–99)
Glucose-Capillary: 248 mg/dL — ABNORMAL HIGH (ref 65–99)
Glucose-Capillary: 155 mg/dL — ABNORMAL HIGH (ref 65–99)
Glucose-Capillary: 112 mg/dL — ABNORMAL HIGH (ref 65–99)

## 2016-12-29 LAB — BASIC METABOLIC PANEL
Anion gap: 9 (ref 5–15)
BUN: 54 mg/dL — ABNORMAL HIGH (ref 6–20)
CO2: 22 mmol/L (ref 22–32)
Calcium: 8.1 mg/dL — ABNORMAL LOW (ref 8.9–10.3)
Chloride: 107 mmol/L (ref 101–111)
Creatinine, Ser: 2.64 mg/dL — ABNORMAL HIGH (ref 0.61–1.24)
GFR calc Af Amer: 26 mL/min — ABNORMAL LOW (ref >60)
GFR calc non Af Amer: 23 mL/min — ABNORMAL LOW (ref >60)
Glucose, Bld: 210 mg/dL — ABNORMAL HIGH (ref 65–99)
Potassium: 4.6 mmol/L (ref 3.5–5.1)
Sodium: 138 mmol/L (ref 135–145)

## 2016-12-29 LAB — TYPE AND SCREEN
ABO/RH(D): B POS
Antibody Screen: NEGATIVE
Unit division: 0

## 2016-12-29 LAB — BPAM RBC
Blood Product Expiration Date: 201804132359
ISSUE DATE / TIME: 201803181221
Unit Type and Rh: 7300

## 2016-12-29 LAB — HEMOGLOBIN AND HEMATOCRIT, BLOOD
HCT: 24.6 % — ABNORMAL LOW (ref 39.0–52.0)
Hemoglobin: 8.6 g/dL — ABNORMAL LOW (ref 13.0–17.0)

## 2016-12-29 MED ORDER — SODIUM CHLORIDE 0.9 % IV SOLN
INTRAVENOUS | Status: DC
  Administered 2016-12-29: 09:00:00 via INTRAVENOUS
  Filled 2016-12-29 (×3): qty 1000

## 2016-12-29 MED ORDER — INSULIN ASPART 100 UNIT/ML ~~LOC~~ SOLN: 0.0000 [IU] | Freq: Every day | SUBCUTANEOUS | Status: DC

## 2016-12-29 MED ORDER — INSULIN ASPART 100 UNIT/ML ~~LOC~~ SOLN
0.0000 [IU] | Freq: Three times a day (TID) | SUBCUTANEOUS | Status: DC
  Administered 2016-12-29: 3 [IU] via SUBCUTANEOUS
  Administered 2016-12-29: 2 [IU] via SUBCUTANEOUS
  Administered 2016-12-30: 9 [IU] via SUBCUTANEOUS
  Administered 2016-12-30: 1 [IU] via SUBCUTANEOUS

## 2016-12-29 MED ORDER — CHLORHEXIDINE GLUCONATE CLOTH 2 % EX PADS
6.0000 | MEDICATED_PAD | Freq: Every day | CUTANEOUS | Status: DC
  Administered 2016-12-29: 6 via TOPICAL

## 2016-12-29 NOTE — Progress Notes (Signed)
Spoke with pt's daughter Coralyn Mark by phone today (pt's daughter works until [employer] in [place], [place]- Phone # [phone]).  Pt's daughter told me that a pharmacist in the hospital spoke with her the other day and explained how Lantus works and when it should be given.  Pt's daughter Coralyn Mark told me that she and pt's sisters are responsible for giving pt his meds.  Dtr gives pt his meds in the AM and in the evening and sisters go by sometimes in the afternoon.  Pt's daughter told me she and pt's sisters know how to give injections, they were just unclear as to how much and when to give the insulin.  Dtr told me that pt is forgetful and will not regularly take his meds on his own.  Pt's daughter also told me that they do not have scale to follow for the Novolog.  Daughter requested that the doctor please give her a copy of the Novolog SSI at time of discharge so they will know how much Novolog to give.  Explained how Novolog and Lantus work to [employer] dtr.  Explained when to give the Lantus and Novolog and told dtr that I will ask the doctor to provide her (the dtr) with a copy of the SSI at time of d/c.  Pt's daughter appreciative of my call.    --Will follow patient during hospitalization--  Wyn Quaker RN, MSN, CDE Diabetes Coordinator Inpatient Glycemic Control Team Team Pager: [phone] (8a-5p)

## 2016-12-29 NOTE — Progress Notes (Signed)
Inpatient Diabetes Program Recommendations  AACE/ADA: New Consensus Statement on Inpatient Glycemic Control (2015)  Target Ranges:  Prepandial:   less than 140 mg/dL      Peak postprandial:   less than 180 mg/dL (1-2 hours)      Critically ill patients:  140 - 180 mg/dL   Results for KANAN, SOBEK (MRN [id number]) as of 12/29/2016 09:34  Ref. Range 12/28/2016 05:30  Sodium Latest Ref Range: 135 - 145 mmol/L 127 (L)  Potassium Latest Ref Range: 3.5 - 5.1 mmol/L 7.4 (HH)  Chloride Latest Ref Range: 101 - 111 mmol/L 89 (L)  CO2 Latest Ref Range: 22 - 32 mmol/L <7 (L)  Glucose Latest Ref Range: 65 - 99 mg/dL 1,426 (HH)  BUN Latest Ref Range: 6 - 20 mg/dL 74 (H)  Creatinine Latest Ref Range: 0.61 - 1.24 mg/dL 4.46 (H)  Calcium Latest Ref Range: 8.9 - 10.3 mg/dL 9.2  Anion gap Latest Ref Range: 5 - 15  NOT CALCULATED    Admit with: DKA  History: DM, CVA, Previous Admits for DKA  Home DM Meds: Lantus 5 units daily (not taking)       Novolog 0-15 units TID per SSI  Current Insulin Orders: Lantus 5 units daily      Novolog Sensitive Correction Scale/ SSI (0-9 units) TID AC + HS       -Note patient transitioned off IV Insulin drip yesterday around 10pm.  Lantus 5 units given at 9pm before insulin drip turned off per protocol.  -Current A1c level pending.  Last A1c was 10.5% on 11/19/16.  Expect A1c to be elevated given family stated patient is noncompliant with his insulin at home.  Lives alone per family.  -Note family stated to MD that they were never educated on what insulin to give patient or how to give insulin.  Per records, pt lives at home alone?    -DM Coordinator to follow up with patient and family when family is available to discuss insulins and when, how much, how to take.    -Of note, DM Coordinator spoke with pt's daughter by phone on 11/17/16 and discussed pt's insulin regimen.  DM Coordinator also spoke with patient on 12/15/16 and explained how to give insulin as well.        --Will follow patient during hospitalization--  Wyn Quaker RN, MSN, CDE Diabetes Coordinator Inpatient Glycemic Control Team Team Pager: [phone] (8a-5p)

## 2016-12-29 NOTE — Progress Notes (Signed)
PROGRESS NOTE  [name] [name] NFA:[id number] DOB: September 20, 1946 DOA: 12/28/2016 PCP: Mauricio Po, [name]  Brief History:  71 year old male with a history of stroke, diabetes mellitus type 2, hypertension, previous admission for DKA, B12 deficiency presented to the emergency department when the patient was found to be "breathing heavy".  Apparently, the patient had some nausea and emesis. The patient's daughter stated that non compliant to insulin regimen, he lives by himself and doesn't administer insulin, . The daughter says  She and rest of the family do not know how much and how to administer insulin and they were not given clear instructions. The patient himself states that, "I forgot to take my insulin one night".the patient was recently discharged from the hospital after a stay from 12/13/2016 through 12/17/2016. At that time, the patient was treated for accidental insulin overdose and acute on chronic renal failure. Patient has had previous admissions for DKA and NKHS. In the emergency department, the patient was found to have serum glucose of 1426 with bicarbonate <7.  The patient was started on a bicarbonate drip and fluid resuscitated. He was started on insulin drip and serial labs and CBGs were monitored.   Assessment/Plan: Diabetic ketoacidosis and type II diabetic  -Secondary to poor compliance  -Hemoglobin A1c  --patient started on IV insulin with q 1 hour CBG check and q 4 hour BMPs -pt started on aggressive fluid resuscitation -Electrolytes were monitored and repleted -transitioned to [place] insulin once anion gap closed -diet was advanced once anion gap closed -Consult diabetic coordinator for continued education  Acute metabolic encephalopathy -Secondary to DKA -UA negative for pyuria -Resolved -Mental status back to baseline  Acute on chronic renal failure--CKD stage III -12/28/2016 renal ultrasound negative for hydronephrosis -Secondary to volume depletion -Continue  IV fluids -Discontinue bicarbonate drip  Hypertension -Holding Carvedilol, ramipril and amlodipine secondary to soft blood pressure  History of stroke -Continue aspirin  B12 deficiency -Continue home dose B12 -11/19/2016 and serum B12-- 609   Disposition Plan:   Home 12/30/16 if stable Family Communication:   No Family at bedside--Total time spent 35 minutes.  Greater than 50% spent face to face counseling and coordinating care.   Consultants:  none  Code Status:  FULL  DVT Prophylaxis: [place] Lovenox   Procedures: As Listed in Progress Note Above  Antibiotics: None    Subjective: Patient denies fevers, chills, headache, chest pain, dyspnea, nausea, vomiting, diarrhea, abdominal pain, dysuria, hematuria, hematochezia, and melena.   Objective: Vitals:   12/29/16 0600 12/29/16 0700 12/29/16 0800 12/29/16 0809  BP: 105/88 (!) 137/54  140/60  Pulse: 69 67 68 68  Resp: (!) 23 (!) 22 (!) 23 18  Temp:   98.2 F (36.8 C)   TempSrc:      SpO2: 99% 96% 96% 94%  Weight:      Height:        Intake/Output Summary (Last 24 hours) at 12/29/16 0944 Last data filed at 12/29/16 0800  Gross per 24 hour  Intake          4223.78 ml  Output             1600 ml  Net          2623.78 ml   Weight change: 7.286 kg (16 lb 1 oz) Exam:   General:  Pt is alert, follows commands appropriately, not in acute distress  HEENT: No icterus, No thrush, No neck mass, [place]/AT  Cardiovascular:  RRR, S1/S2, no rubs, no gallops  Respiratory: CTA bilaterally, no wheezing, no crackles, no rhonchi  Abdomen: Soft/+BS, non tender, non distended, no guarding  Extremities: No edema, No lymphangitis, No petechiae, No rashes, no synovitis   Data Reviewed: I have personally reviewed following labs and imaging studies Basic Metabolic Panel:  Recent Labs Lab 12/28/16 0530 12/28/16 0631 12/28/16 0954 12/28/16 1745 12/28/16 2127 12/29/16 0311  NA 127* 130* 133* 142 140 138  K 7.4* 5.5* 4.7 3.8  3.9 4.6  CL 89* 100* 104 113* 109 107  CO2 <7*  --  10* 22 22 22   GLUCOSE 1,426* >700* 820* 129* 90 210*  BUN 74* 66* 70* 63* 59* 54*  CREATININE 4.46* 3.80* 4.18* 3.27* 3.08* 2.64*  CALCIUM 9.2  --  8.0* 8.6* 8.4* 8.1*   Liver Function Tests:  Recent Labs Lab 12/28/16 0530  AST 17  ALT 14*  ALKPHOS 115  BILITOT 2.0*  PROT 5.9*  ALBUMIN 3.2*    Recent Labs Lab 12/28/16 0530  LIPASE 21   No results for input(s): AMMONIA in the last 168 hours. Coagulation Profile: No results for input(s): INR, PROTIME in the last 168 hours. CBC:  Recent Labs Lab 12/28/16 0430 12/28/16 0631 12/28/16 0954 12/28/16 1745 12/29/16 0637  WBC 11.1*  --  11.5*  --   --   NEUTROABS 9.6*  --  8.7*  --   --   HGB 8.0* 7.5* 6.6* 8.4* 8.6*  HCT 26.7* 22.0* 20.2* 23.9* 24.6*  MCV 109.4*  --  95.7  --   --   PLT 286  --  234  --   --    Cardiac Enzymes: No results for input(s): CKTOTAL, CKMB, CKMBINDEX, TROPONINI in the last 168 hours. BNP: Invalid input(s): POCBNP CBG:  Recent Labs Lab 12/28/16 1928 12/28/16 2026 12/28/16 2128 12/28/16 2242 12/29/16 0732  GLUCAP 91 79 86 87 298*   HbA1C: No results for input(s): HGBA1C in the last 72 hours. Urine analysis:    Component Value Date/Time   COLORURINE YELLOW 12/28/2016 0557   APPEARANCEUR CLOUDY (A) 12/28/2016 0557   LABSPEC 1.015 12/28/2016 0557   PHURINE 5.0 12/28/2016 0557   GLUCOSEU >=500 (A) 12/28/2016 0557   HGBUR SMALL (A) 12/28/2016 0557   BILIRUBINUR NEGATIVE 12/28/2016 0557   KETONESUR 20 (A) 12/28/2016 0557   PROTEINUR 30 (A) 12/28/2016 0557   UROBILINOGEN 0.2 08/21/2015 2154   NITRITE NEGATIVE 12/28/2016 0557   LEUKOCYTESUR NEGATIVE 12/28/2016 0557   Sepsis Labs: @LABRCNTIP (procalcitonin:4,lacticidven:4) )No results found for this or any previous visit (from the past 240 hour(s)).   Scheduled Meds: . Chlorhexidine Gluconate Cloth  6 each Topical Q0600  . enoxaparin (LOVENOX) injection  30 mg Subcutaneous  Q24H  . insulin aspart  0-5 Units Subcutaneous QHS  . insulin aspart  0-9 Units Subcutaneous TID WC  . insulin glargine  5 Units Subcutaneous Daily   Continuous Infusions: . sodium chloride Stopped (12/28/16 1041)  . sodium chloride 0.9 % 1,000 mL infusion 75 mL/hr at 12/29/16 0845    Procedures/Studies: Ct Head Wo Contrast  Result Date: 12/13/2016 CLINICAL DATA:  Possible seizure, acute onset. Patient found unresponsive. Initial encounter. EXAM: CT HEAD WITHOUT CONTRAST TECHNIQUE: Contiguous axial images were obtained from the base of the skull through the vertex without intravenous contrast. COMPARISON:  CT of the head performed 10/01/2016 FINDINGS: Brain: No evidence of acute infarction, hemorrhage, hydrocephalus, extra-axial collection or mass lesion/mass effect. Prominence of the ventricles and sulci reflects mild cortical volume  loss. Mild cerebellar atrophy is noted. Scattered periventricular white matter change likely reflects small vessel ischemic microangiopathy. The brainstem and fourth ventricle are within normal limits. The basal ganglia are unremarkable in appearance. The cerebral hemispheres demonstrate grossly normal gray-white differentiation. No mass effect or midline shift is seen. Vascular: No hyperdense vessel or unexpected calcification. Skull: There is no evidence of fracture; visualized osseous structures are unremarkable in appearance. Sinuses/Orbits: The orbits are within normal limits. There is mild partial opacification of the left maxillary sinus. The remaining paranasal sinuses and mastoid air cells are well-aerated. Other: No significant soft tissue abnormalities are seen. IMPRESSION: 1. No acute intracranial pathology seen on CT. 2. Mild cortical volume loss and scattered small vessel ischemic microangiopathy. 3. Mild partial opacification of the left maxillary sinus. Electronically Signed   By: Garald Balding M.D.   On: 12/13/2016 22:46   US Renal  Result Date:  12/28/2016 CLINICAL DATA:  Acute renal failure.  Diabetes.  Hypertension. EXAM: RENAL / URINARY TRACT ULTRASOUND COMPLETE COMPARISON:  Abdomen and pelvis CT dated 07/06/2016. FINDINGS: Right Kidney: Length: 11.2 cm. Echogenicity within normal limits. No mass or hydronephrosis visualized. Left Kidney: Length: 11.0 cm. Echogenicity within normal limits. No mass or hydronephrosis visualized. Bladder: Appears normal for degree of bladder distention. IMPRESSION: Normal examination. Electronically Signed   By: Claudie Revering M.D.   On: 12/28/2016 11:42   Dg Chest Portable 1 View  Result Date: 12/28/2016 CLINICAL DATA:  Chest pain, hyperglycemia, hypotension. History of diabetes and hypertension. Former smoker. EXAM: PORTABLE CHEST 1 VIEW COMPARISON:  12/13/2016 FINDINGS: Shallow inspiration. Normal heart size and pulmonary vascularity. No focal airspace disease or consolidation in the lungs. No blunting of costophrenic angles. No pneumothorax. Mediastinal contours appear intact. IMPRESSION: No active disease. Electronically Signed   By: Lucienne Capers M.D.   On: 12/28/2016 06:03   Dg Chest Portable 1 View  Result Date: 12/13/2016 CLINICAL DATA:  Seizure. EXAM: PORTABLE CHEST 1 VIEW COMPARISON:  Radiographs 11/16/2016 FINDINGS: Lower lung volumes from prior exam. There is mild cardiomegaly. Mild vascular congestion and cephalization of pulmonary vasculature. No focal airspace disease, pleural effusion or pneumothorax. No acute osseous abnormalities are seen. Degenerative change in both shoulders. IMPRESSION: Mild cardiomegaly and vascular congestion. This is new from prior exam. Electronically Signed   By: Jeb Levering M.D.   On: 12/13/2016 22:26    Lucy Woolever, DO  [facility] Pager [phone]  If 7PM-7AM, please contact night-coverage [email or web address] Password [email or web address] 12/29/2016, 9:44 AM   LOS: 1 day

## 2016-12-30 DIAGNOSIS — N178 Other acute kidney failure: Secondary | ICD-10-CM

## 2016-12-30 LAB — BASIC METABOLIC PANEL
Anion gap: 7 (ref 5–15)
BUN: 32 mg/dL — ABNORMAL HIGH (ref 6–20)
CO2: 24 mmol/L (ref 22–32)
Calcium: 8.1 mg/dL — ABNORMAL LOW (ref 8.9–10.3)
Chloride: 105 mmol/L (ref 101–111)
Creatinine, Ser: 1.46 mg/dL — ABNORMAL HIGH (ref 0.61–1.24)
GFR calc Af Amer: 54 mL/min — ABNORMAL LOW (ref >60)
GFR calc non Af Amer: 47 mL/min — ABNORMAL LOW (ref >60)
Glucose, Bld: 285 mg/dL — ABNORMAL HIGH (ref 65–99)
Potassium: 4.2 mmol/L (ref 3.5–5.1)
Sodium: 136 mmol/L (ref 135–145)

## 2016-12-30 LAB — CBC
HCT: 24.9 % — ABNORMAL LOW (ref 39.0–52.0)
Hemoglobin: 8.7 g/dL — ABNORMAL LOW (ref 13.0–17.0)
MCH: 31.2 pg (ref 26.0–34.0)
MCHC: 34.9 g/dL (ref 30.0–36.0)
MCV: 89.2 fL (ref 78.0–100.0)
Platelets: 174 10*3/uL (ref 150–400)
RBC: 2.79 MIL/uL — ABNORMAL LOW (ref 4.22–5.81)
RDW: 15.6 % — ABNORMAL HIGH (ref 11.5–15.5)
WBC: 9.2 10*3/uL (ref 4.0–10.5)

## 2016-12-30 LAB — GLUCOSE, CAPILLARY
Glucose-Capillary: 370 mg/dL — ABNORMAL HIGH (ref 65–99)
Glucose-Capillary: 136 mg/dL — ABNORMAL HIGH (ref 65–99)

## 2016-12-30 LAB — HEMOGLOBIN A1C
Hgb A1c MFr Bld: 8.5 % — ABNORMAL HIGH (ref 4.8–5.6)
Mean Plasma Glucose: 197 mg/dL

## 2016-12-30 LAB — MAGNESIUM: Magnesium: 1.8 mg/dL (ref 1.7–2.4)

## 2016-12-30 MED ORDER — ENOXAPARIN SODIUM 40 MG/0.4ML ~~LOC~~ SOLN: 40.0000 mg | SUBCUTANEOUS | Status: DC

## 2016-12-30 MED ORDER — ACETAMINOPHEN 325 MG PO TABS: 650.0000 mg | ORAL_TABLET | Freq: Four times a day (QID) | ORAL | Status: DC | PRN

## 2016-12-30 MED ORDER — INSULIN GLARGINE 100 UNIT/ML ~~LOC~~ SOLN: 10.0000 [IU] | Freq: Every day | SUBCUTANEOUS | Status: DC

## 2016-12-30 MED ORDER — INSULIN GLARGINE 100 UNIT/ML ~~LOC~~ SOLN: 10.0000 [IU] | Freq: Every day | SUBCUTANEOUS | 1 refills | Status: DC

## 2016-12-30 MED ORDER — INSULIN ASPART 100 UNIT/ML ~~LOC~~ SOLN: 0.0000 [IU] | SUBCUTANEOUS | 0 refills | Status: DC

## 2016-12-30 NOTE — Care Management Note (Signed)
Case Management Note  Patient Details  Name: [name] [name] MRN: [id number] Date of Birth: 1946-06-17  Subjective/Objective:   71 yo admitted with DKA                 Action/Plan: Pt from home alone. Pt states his daughter lives nearby and checks on him at home. Pt recently discharged and was referred to [facility] for home health services. Per [facility] rep pt declined services at that time. Pt states now that he is willing to accept home health services and chooses [facility] again. [facility] rep alerted of interest in home health services. This CM contacted [facility] rep to see if pt was eligible. Pt does not have correct insurance for [facility] services. Will need MD orders for HHRN/CSW at discharge. CM will continue to follow.  Expected Discharge Date:  01/01/17               Expected Discharge Plan:  [place]  In-House Referral:     Discharge planning Services  CM Consult  Post Acute Care Choice:  Home Health Choice offered to:  Patient  DME Arranged:    DME Agency:     HH Arranged:  RN, Social Work [employer] Agency:  [employer] (now [facility])  Status of Service:  Completed, signed off  If discussed at [employer], dates discussed:    Additional CommentsLynnell Catalan, RN 12/30/2016, 11:34 AM  [phone]

## 2016-12-30 NOTE — Discharge Summary (Signed)
Physician Discharge Summary  Poseidon Pam DEY:[id number] DOB: 1946-06-22 DOA: 12/28/2016  PCP: Mauricio Po, [name]  Admit date: 12/28/2016 Discharge date: 12/30/2016  Admitted From: Home Disposition:  Home  Recommendations for Outpatient Follow-up:  1. Follow up with PCP in 1-2 weeks 2. Please obtain BMP/CBC in one week   Home Health: [facility] Equipment/Devices: HHRN  Discharge Condition: Stable CODE STATUS:FULL Diet recommendation: Heart Healthy   Brief/Interim Summary: 71 year old male with a history of stroke, diabetes mellitus type 2, hypertension, previous admission for DKA, B12 deficiency presented to the emergency department when the patient was found to be "breathing heavy".  Apparently, the patient had some nausea and emesis. The patient's daughter stated thatnon compliant to insulin regimen, he lives by himself and doesn't administer insulin, . The daughter says She and rest of the family do not know how much and how to administer insulin and they were not given clear instructions. The patient himself states that, "I forgot to take my insulin one night".the patient was recently discharged from the hospital after a stay from 12/13/2016 through 12/17/2016. At that time, the patient was treated for accidental insulin overdose and acute on chronic renal failure. Patient has had previous admissions for DKA and NKHS. In the emergency department, the patient was found to have serum glucose of 1426 with bicarbonate <7.  The patient was started on a bicarbonate drip and fluid resuscitated. He was started on insulin drip and serial labs and CBGs were monitored.   Discharge Diagnoses:  Diabetic ketoacidosis and type II diabetic  -Secondary to poor compliance  -Hemoglobin A1c--8.5 --patient started on IV insulin with q 1 hour CBG check and q 4 hour BMPs -pt started on aggressive fluid resuscitation -Electrolytes were monitored and repleted -transitioned to [place] insulin once anion gap  closed -diet was advanced once anion gap closed -Consult diabetic coordinator for continued education-->education provided to daughters -home with lantus 10 units daily and novolog sliding scale  Acute metabolic encephalopathy -Secondary to DKA -UA negative for pyuria -Resolved -Mental status back to baseline  Acute on chronic renal failure--CKD stage III -12/28/2016 renal ultrasound negative for hydronephrosis -Secondary to volume depletion -Continue IV fluids-->resolved -Discontinued bicarbonate drip -serum creatinine 1.46 on day of d/c -baseline creatinine 1.4-1.7  Hypertension -Holding Carvedilol,and amlodipine secondary to soft blood pressure-->restart as pt hypertensive again at time of d/c -d/c ramipril due to CKD and high risk of AKI in this patient  History of stroke -Continue aspirin  B12 deficiency -Continue home dose B12 -11/19/2016 and serum B12-- 609    Discharge Instructions  Discharge Instructions    Diet - low sodium heart healthy    Complete by:  As directed    Increase activity slowly    Complete by:  As directed      Allergies as of 12/30/2016   No Known Allergies     Medication List    STOP taking these medications   ramipril 10 MG capsule Commonly known as:  ALTACE   senna 8.6 MG Tabs tablet Commonly known as:  SENOKOT   thiamine 100 MG tablet     TAKE these medications   acetaminophen 325 MG tablet Commonly known as:  TYLENOL Take 2 tablets (650 mg total) by mouth every 6 (six) hours as needed for mild pain (or Fever >/= 101).   amLODipine 10 MG tablet Commonly known as:  NORVASC Take 1 tablet (10 mg total) by mouth daily.   aspirin 81 MG EC tablet Take 1 tablet (81 mg total)  by mouth daily.   carvedilol 6.25 MG tablet Commonly known as:  COREG Take 1 tablet (6.25 mg total) by mouth 2 (two) times daily with a meal.   cyanocobalamin 1000 MCG/ML injection Commonly known as:  (VITAMIN B-12) Inject 1 mL (1,000 mcg  total) into the muscle every 30 (thirty) days.   FLUoxetine 20 MG capsule Commonly known as:  PROZAC Take 20 mg by mouth daily.   glucose blood test strip Commonly known as:  TRUE METRIX BLOOD GLUCOSE TEST Use to check blood sugars three times a day Dx E11.8   insulin aspart 100 UNIT/ML injection Commonly known as:  novoLOG Inject 0-15 Units into the skin See admin instructions. 0-15 Units, Subcutaneous, 3 times daily with meals CBG < 70: implement hypoglycemia protocol CBG 70 - 120: 0 units CBG 121 - 150: 2 units CBG 151 - 200: 3 units CBG 201 - 250: 5 units CBG 251 - 300: 8 units CBG 301 - 350: 11 units CBG 351 - 400: 15 units CBG > 400: call MD   insulin glargine 100 UNIT/ML injection Commonly known as:  LANTUS Inject 0.1 mLs (10 Units total) into the skin daily. Start taking on:  12/31/2016 What changed:  how much to take       No Known Allergies  Consultations:  none   Procedures/Studies: Ct Head Wo Contrast  Result Date: 12/13/2016 CLINICAL DATA:  Possible seizure, acute onset. Patient found unresponsive. Initial encounter. EXAM: CT HEAD WITHOUT CONTRAST TECHNIQUE: Contiguous axial images were obtained from the base of the skull through the vertex without intravenous contrast. COMPARISON:  CT of the head performed 10/01/2016 FINDINGS: Brain: No evidence of acute infarction, hemorrhage, hydrocephalus, extra-axial collection or mass lesion/mass effect. Prominence of the ventricles and sulci reflects mild cortical volume loss. Mild cerebellar atrophy is noted. Scattered periventricular white matter change likely reflects small vessel ischemic microangiopathy. The brainstem and fourth ventricle are within normal limits. The basal ganglia are unremarkable in appearance. The cerebral hemispheres demonstrate grossly normal gray-white differentiation. No mass effect or midline shift is seen. Vascular: No hyperdense vessel or unexpected calcification. Skull: There is no evidence of  fracture; visualized osseous structures are unremarkable in appearance. Sinuses/Orbits: The orbits are within normal limits. There is mild partial opacification of the left maxillary sinus. The remaining paranasal sinuses and mastoid air cells are well-aerated. Other: No significant soft tissue abnormalities are seen. IMPRESSION: 1. No acute intracranial pathology seen on CT. 2. Mild cortical volume loss and scattered small vessel ischemic microangiopathy. 3. Mild partial opacification of the left maxillary sinus. Electronically Signed   By: Garald Balding M.D.   On: 12/13/2016 22:46   US Renal  Result Date: 12/28/2016 CLINICAL DATA:  Acute renal failure.  Diabetes.  Hypertension. EXAM: RENAL / URINARY TRACT ULTRASOUND COMPLETE COMPARISON:  Abdomen and pelvis CT dated 07/06/2016. FINDINGS: Right Kidney: Length: 11.2 cm. Echogenicity within normal limits. No mass or hydronephrosis visualized. Left Kidney: Length: 11.0 cm. Echogenicity within normal limits. No mass or hydronephrosis visualized. Bladder: Appears normal for degree of bladder distention. IMPRESSION: Normal examination. Electronically Signed   By: Claudie Revering M.D.   On: 12/28/2016 11:42   Dg Chest Portable 1 View  Result Date: 12/28/2016 CLINICAL DATA:  Chest pain, hyperglycemia, hypotension. History of diabetes and hypertension. Former smoker. EXAM: PORTABLE CHEST 1 VIEW COMPARISON:  12/13/2016 FINDINGS: Shallow inspiration. Normal heart size and pulmonary vascularity. No focal airspace disease or consolidation in the lungs. No blunting of costophrenic angles. No pneumothorax.  Mediastinal contours appear intact. IMPRESSION: No active disease. Electronically Signed   By: Lucienne Capers M.D.   On: 12/28/2016 06:03   Dg Chest Portable 1 View  Result Date: 12/13/2016 CLINICAL DATA:  Seizure. EXAM: PORTABLE CHEST 1 VIEW COMPARISON:  Radiographs 11/16/2016 FINDINGS: Lower lung volumes from prior exam. There is mild cardiomegaly. Mild vascular  congestion and cephalization of pulmonary vasculature. No focal airspace disease, pleural effusion or pneumothorax. No acute osseous abnormalities are seen. Degenerative change in both shoulders. IMPRESSION: Mild cardiomegaly and vascular congestion. This is new from prior exam. Electronically Signed   By: Jeb Levering M.D.   On: 12/13/2016 22:26        Discharge Exam: Vitals:   12/29/16 2141 12/30/16 0508  BP: (!) 147/78 (!) 154/80  Pulse: 72 76  Resp: 19 18  Temp: 98.9 F (37.2 C) 99.6 F (37.6 C)   Vitals:   12/29/16 0900 12/29/16 1014 12/29/16 2141 12/30/16 0508  BP: 137/70 125/70 (!) 147/78 (!) 154/80  Pulse: 71 72 72 76  Resp: 20 18 19 18   Temp:  98.4 F (36.9 C) 98.9 F (37.2 C) 99.6 F (37.6 C)  TempSrc:  Oral Oral Oral  SpO2: 99% 100% 98% 99%  Weight:  66.3 kg (146 lb 2.6 oz)    Height:  5\' 9"  (1.753 m)      General: Pt is alert, awake, not in acute distress Cardiovascular: RRR, S1/S2 +, no rubs, no gallops Respiratory: CTA bilaterally, no wheezing, no rhonchi Abdominal: Soft, NT, ND, bowel sounds + Extremities: no edema, no cyanosis   The results of significant diagnostics from this hospitalization (including imaging, microbiology, ancillary and laboratory) are listed below for reference.    Significant Diagnostic Studies: Ct Head Wo Contrast  Result Date: 12/13/2016 CLINICAL DATA:  Possible seizure, acute onset. Patient found unresponsive. Initial encounter. EXAM: CT HEAD WITHOUT CONTRAST TECHNIQUE: Contiguous axial images were obtained from the base of the skull through the vertex without intravenous contrast. COMPARISON:  CT of the head performed 10/01/2016 FINDINGS: Brain: No evidence of acute infarction, hemorrhage, hydrocephalus, extra-axial collection or mass lesion/mass effect. Prominence of the ventricles and sulci reflects mild cortical volume loss. Mild cerebellar atrophy is noted. Scattered periventricular white matter change likely reflects small  vessel ischemic microangiopathy. The brainstem and fourth ventricle are within normal limits. The basal ganglia are unremarkable in appearance. The cerebral hemispheres demonstrate grossly normal gray-white differentiation. No mass effect or midline shift is seen. Vascular: No hyperdense vessel or unexpected calcification. Skull: There is no evidence of fracture; visualized osseous structures are unremarkable in appearance. Sinuses/Orbits: The orbits are within normal limits. There is mild partial opacification of the left maxillary sinus. The remaining paranasal sinuses and mastoid air cells are well-aerated. Other: No significant soft tissue abnormalities are seen. IMPRESSION: 1. No acute intracranial pathology seen on CT. 2. Mild cortical volume loss and scattered small vessel ischemic microangiopathy. 3. Mild partial opacification of the left maxillary sinus. Electronically Signed   By: Garald Balding M.D.   On: 12/13/2016 22:46   US Renal  Result Date: 12/28/2016 CLINICAL DATA:  Acute renal failure.  Diabetes.  Hypertension. EXAM: RENAL / URINARY TRACT ULTRASOUND COMPLETE COMPARISON:  Abdomen and pelvis CT dated 07/06/2016. FINDINGS: Right Kidney: Length: 11.2 cm. Echogenicity within normal limits. No mass or hydronephrosis visualized. Left Kidney: Length: 11.0 cm. Echogenicity within normal limits. No mass or hydronephrosis visualized. Bladder: Appears normal for degree of bladder distention. IMPRESSION: Normal examination. Electronically Signed   By:  Claudie Revering M.D.   On: 12/28/2016 11:42   Dg Chest Portable 1 View  Result Date: 12/28/2016 CLINICAL DATA:  Chest pain, hyperglycemia, hypotension. History of diabetes and hypertension. Former smoker. EXAM: PORTABLE CHEST 1 VIEW COMPARISON:  12/13/2016 FINDINGS: Shallow inspiration. Normal heart size and pulmonary vascularity. No focal airspace disease or consolidation in the lungs. No blunting of costophrenic angles. No pneumothorax. Mediastinal  contours appear intact. IMPRESSION: No active disease. Electronically Signed   By: Lucienne Capers M.D.   On: 12/28/2016 06:03   Dg Chest Portable 1 View  Result Date: 12/13/2016 CLINICAL DATA:  Seizure. EXAM: PORTABLE CHEST 1 VIEW COMPARISON:  Radiographs 11/16/2016 FINDINGS: Lower lung volumes from prior exam. There is mild cardiomegaly. Mild vascular congestion and cephalization of pulmonary vasculature. No focal airspace disease, pleural effusion or pneumothorax. No acute osseous abnormalities are seen. Degenerative change in both shoulders. IMPRESSION: Mild cardiomegaly and vascular congestion. This is new from prior exam. Electronically Signed   By: Jeb Levering M.D.   On: 12/13/2016 22:26     Microbiology: No results found for this or any previous visit (from the past 240 hour(s)).   Labs: Basic Metabolic Panel:  Recent Labs Lab 12/28/16 0954 12/28/16 1745 12/28/16 2127 12/29/16 0311 12/30/16 0455  NA 133* 142 140 138 136  K 4.7 3.8 3.9 4.6 4.2  CL 104 113* 109 107 105  CO2 10* 22 22 22 24   GLUCOSE 820* 129* 90 210* 285*  BUN 70* 63* 59* 54* 32*  CREATININE 4.18* 3.27* 3.08* 2.64* 1.46*  CALCIUM 8.0* 8.6* 8.4* 8.1* 8.1*  MG  --   --   --   --  1.8   Liver Function Tests:  Recent Labs Lab 12/28/16 0530  AST 17  ALT 14*  ALKPHOS 115  BILITOT 2.0*  PROT 5.9*  ALBUMIN 3.2*    Recent Labs Lab 12/28/16 0530  LIPASE 21   No results for input(s): AMMONIA in the last 168 hours. CBC:  Recent Labs Lab 12/28/16 0430 12/28/16 0631 12/28/16 0954 12/28/16 1745 12/29/16 0637 12/30/16 0455  WBC 11.1*  --  11.5*  --   --  9.2  NEUTROABS 9.6*  --  8.7*  --   --   --   HGB 8.0* 7.5* 6.6* 8.4* 8.6* 8.7*  HCT 26.7* 22.0* 20.2* 23.9* 24.6* 24.9*  MCV 109.4*  --  95.7  --   --  89.2  PLT 286  --  234  --   --  174   Cardiac Enzymes: No results for input(s): CKTOTAL, CKMB, CKMBINDEX, TROPONINI in the last 168 hours. BNP: Invalid input(s): POCBNP CBG:  Recent  Labs Lab 12/29/16 1240 12/29/16 1825 12/29/16 2219 12/30/16 0719 12/30/16 1235  GLUCAP 248* 155* 112* 370* 136*    Time coordinating discharge:  Greater than 30 minutes  Signed:  Angeligue Bowne, DO [facility] Pager: [phone] 12/30/2016, 2:52 PM

## 2016-12-30 NOTE — Progress Notes (Signed)
Patient and daughter verbalized understanding of discharge instructions. Patient was given AVS and prescription. Patient daughter is taking patient home and its at bedside during discharge instructions.

## 2016-12-30 NOTE — Progress Notes (Signed)
Inpatient Diabetes Program Recommendations  AACE/ADA: New Consensus Statement on Inpatient Glycemic Control (2015)  Target Ranges:  Prepandial:   less than 140 mg/dL      Peak postprandial:   less than 180 mg/dL (1-2 hours)      Critically ill patients:  140 - 180 mg/dL   Results for JAVIONE, GUNAWAN (MRN [id number]) as of 12/30/2016 09:57  Ref. Range 12/29/2016 07:32 12/29/2016 12:40 12/29/2016 18:25 12/29/2016 22:19  Glucose-Capillary Latest Ref Range: 65 - 99 mg/dL 298 (H) 248 (H) 155 (H) 112 (H)   Results for AKSHAR, STARNES (MRN [id number]) as of 12/30/2016 09:57  Ref. Range 12/30/2016 07:19  Glucose-Capillary Latest Ref Range: 65 - 99 mg/dL 370 (H)     Home DM Meds: Lantus 5 units daily (not taking)                             Novolog 0-15 units TID per SSI  Current Insulin Orders: Lantus 5 units daily                                       Novolog Sensitive Correction Scale/ SSI (0-9 units) TID AC + HS     MD- Please consider the following in-hospital insulin adjustments:  Increase Lantus to 10 units daily (0.15 units/kg dosing based on weight of 66 kg)  Please make sure to provide daughter with a copy of Novolog SSI at time of discharge.  Daughter and pt's sisters give patient his injections and do not have a copy of the Novolog SSI regimen for home.    --Will follow patient during hospitalization--  Wyn Quaker RN, MSN, CDE Diabetes Coordinator Inpatient Glycemic Control Team Team Pager: [phone] (8a-5p)

## 2016-12-30 NOTE — Progress Notes (Signed)
IVF @ 75/hr.wbb

## 2017-01-01 ENCOUNTER — Telehealth: Payer: Self-pay | Admitting: Family

## 2017-01-01 NOTE — Telephone Encounter (Signed)
Noted  

## 2017-01-01 NOTE — Telephone Encounter (Signed)
[facility] at home called in and said that there is a delay of care and will go out to see pt to[place]row   Darlina Guys [phone]

## 2017-01-02 ENCOUNTER — Emergency Department (HOSPITAL_COMMUNITY)
Admission: EM | Admit: 2017-01-02 | Discharge: 2017-01-02 | Disposition: A | Discharge status: Home / Self Care | Payer: Medicare HMO | Attending: Emergency Medicine | Admitting: Emergency Medicine

## 2017-01-02 ENCOUNTER — Encounter (HOSPITAL_COMMUNITY): Payer: Self-pay | Admitting: *Deleted

## 2017-01-02 DIAGNOSIS — N183 Chronic kidney disease, stage 3 (moderate): Secondary | ICD-10-CM | POA: Diagnosis not present

## 2017-01-02 DIAGNOSIS — E11649 Type 2 diabetes mellitus with hypoglycemia without coma: Secondary | ICD-10-CM | POA: Insufficient documentation

## 2017-01-02 DIAGNOSIS — Z7982 Long term (current) use of aspirin: Secondary | ICD-10-CM | POA: Insufficient documentation

## 2017-01-02 DIAGNOSIS — Z794 Long term (current) use of insulin: Secondary | ICD-10-CM | POA: Diagnosis not present

## 2017-01-02 DIAGNOSIS — Z8673 Personal history of transient ischemic attack (TIA), and cerebral infarction without residual deficits: Secondary | ICD-10-CM | POA: Insufficient documentation

## 2017-01-02 DIAGNOSIS — Z87891 Personal history of nicotine dependence: Secondary | ICD-10-CM | POA: Insufficient documentation

## 2017-01-02 DIAGNOSIS — E1122 Type 2 diabetes mellitus with diabetic chronic kidney disease: Secondary | ICD-10-CM | POA: Insufficient documentation

## 2017-01-02 DIAGNOSIS — I129 Hypertensive chronic kidney disease with stage 1 through stage 4 chronic kidney disease, or unspecified chronic kidney disease: Secondary | ICD-10-CM | POA: Diagnosis not present

## 2017-01-02 DIAGNOSIS — E162 Hypoglycemia, unspecified: Secondary | ICD-10-CM

## 2017-01-02 LAB — CBC WITH DIFFERENTIAL/PLATELET
Basophils Absolute: 0 10*3/uL (ref 0.0–0.1)
Basophils Relative: 0 %
Eosinophils Absolute: 0.1 10*3/uL (ref 0.0–0.7)
Eosinophils Relative: 2 %
HCT: 31.9 % — ABNORMAL LOW (ref 39.0–52.0)
Hemoglobin: 10.5 g/dL — ABNORMAL LOW (ref 13.0–17.0)
Lymphocytes Relative: 17 %
Lymphs Abs: 1.4 10*3/uL (ref 0.7–4.0)
MCH: 30.6 pg (ref 26.0–34.0)
MCHC: 32.9 g/dL (ref 30.0–36.0)
MCV: 93 fL (ref 78.0–100.0)
Monocytes Absolute: 0.5 10*3/uL (ref 0.1–1.0)
Monocytes Relative: 6 %
Neutro Abs: 5.9 10*3/uL (ref 1.7–7.7)
Neutrophils Relative %: 75 %
Platelets: 203 10*3/uL (ref 150–400)
RBC: 3.43 MIL/uL — ABNORMAL LOW (ref 4.22–5.81)
RDW: 15.1 % (ref 11.5–15.5)
WBC: 7.9 10*3/uL (ref 4.0–10.5)

## 2017-01-02 LAB — BASIC METABOLIC PANEL
Anion gap: 10 (ref 5–15)
BUN: 7 mg/dL (ref 6–20)
CO2: 23 mmol/L (ref 22–32)
Calcium: 9 mg/dL (ref 8.9–10.3)
Chloride: 105 mmol/L (ref 101–111)
Creatinine, Ser: 1.06 mg/dL (ref 0.61–1.24)
GFR calc Af Amer: >60 mL/min (ref >60)
GFR calc non Af Amer: >60 mL/min (ref >60)
Glucose, Bld: 69 mg/dL (ref 65–99)
Potassium: 3.9 mmol/L (ref 3.5–5.1)
Sodium: 138 mmol/L (ref 135–145)

## 2017-01-02 LAB — I-STAT CHEM 8, ED
BUN: 9 mg/dL (ref 6–20)
Calcium, Ion: 1.21 mmol/L (ref 1.15–1.40)
Chloride: 105 mmol/L (ref 101–111)
Creatinine, Ser: 1 mg/dL (ref 0.61–1.24)
Glucose, Bld: 67 mg/dL (ref 65–99)
HCT: 32 % — ABNORMAL LOW (ref 39.0–52.0)
Hemoglobin: 10.9 g/dL — ABNORMAL LOW (ref 13.0–17.0)
Potassium: 3.7 mmol/L (ref 3.5–5.1)
Sodium: 140 mmol/L (ref 135–145)
TCO2: 23 mmol/L (ref 0–100)

## 2017-01-02 LAB — CBG MONITORING, ED
Glucose-Capillary: 78 mg/dL (ref 65–99)
Glucose-Capillary: 59 mg/dL — ABNORMAL LOW (ref 65–99)
Glucose-Capillary: 95 mg/dL (ref 65–99)

## 2017-01-02 LAB — I-STAT TROPONIN, ED: Troponin i, poc: 0 ng/mL (ref 0.00–0.08)

## 2017-01-02 MED ORDER — ACETAMINOPHEN 500 MG PO TABS
1000.0000 mg | ORAL_TABLET | Freq: Once | ORAL | Status: AC
  Administered 2017-01-02: 1000 mg via ORAL
  Filled 2017-01-02: qty 2

## 2017-01-02 NOTE — Discharge Instructions (Signed)
Eat well for the next couple of days.  Return for repeat episode

## 2017-01-02 NOTE — ED Notes (Signed)
Pt provided with x2 apple juice. Dr. Tyrone Nine made aware of pt's CBG

## 2017-01-02 NOTE — ED Triage Notes (Signed)
Per [facility] - pt from home where his daughter administers his medications to him, pt took 10 units of novolog insulin last night and then 11 units this morning, pt states he sat down to watch tv and forgot to eat. Pt was confused and shaky on EMS arrival, fire dept reported CBG of 17; no IV access attainable by EMS and IO placed in left tib. Pt given 50 of lidocaine and then D50 - cbg up to 176 en route and last recheck of 116. Pty A&Ox4 on arrival to department, c/o left leg pain.

## 2017-01-02 NOTE — ED Provider Notes (Signed)
[facility] DEPT Provider Note   CSN: [id number] Arrival date & time: 01/02/17  1109     History   Chief Complaint Chief Complaint  Patient presents with  . Hypoglycemia    HPI Jaki Hammerschmidt is a 71 y.o. male.  71 yo M with a chief complaint of hypoglycemia. Patient took his NovoLog this morning and did not eat breakfast. He said that he decided to watch TV instead and then lost track of time. His neighbor noted that he was confused and called 911. Initial blood sugar was 17. I/O was placed.    The history is provided by the patient.  Hypoglycemia  Initial blood sugar:  17 Blood sugar after intervention:  78 Severity:  Severe Onset quality:  Sudden Duration:  1 hour Timing:  Constant Progression:  Worsening Chronicity:  New Diabetic status:  Controlled with insulin Time since last antidiabetic medication:  2 hours Context: decreased oral intake   Relieved by:  Nothing Ineffective treatments:  None tried Associated symptoms: weakness   Associated symptoms: no shortness of breath, no tremors and no vomiting     Past Medical History:  Diagnosis Date  . 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 Closed comminuted intertrochanteric fracture of left femur ([place])   . Diabetes mellitus without complication ([place])   . Diabetic hyperosmolar non-ketotic state ([place]) 05/15/2016  . DKA (diabetic ketoacidoses) ([place]) 05/09/2016  . Hypertension   . Postoperative anemia due to acute blood loss 06/18/2016  . Retroperitoneal hematoma 06/18/2016  . Vitamin B 12 deficiency 06/18/2016    Patient Active Problem List   Diagnosis Date Noted  . Acute metabolic encephalopathy 63/33/5456  . Elevated lactic acid level   . DKA (diabetic ketoacidoses) ([place]) 12/28/2016  . AKI (acute kidney injury) ([place]) 12/14/2016  . Hypoglycemia   . Sepsis ([place]) 12/13/2016  . Uncontrolled type 2 diabetes mellitus with complication ([place])   . Encephalopathy,  metabolic   . Acute renal failure superimposed on stage 3 chronic kidney disease ([place])   . Noncompliance with medication regimen 08/17/2016  . History of DVT (deep vein thrombosis) 07/06/2016  . Retroperitoneal hematoma 06/18/2016  . Vitamin B 12 deficiency 06/18/2016  . Anxiety 05/15/2016  . History of CVA (cerebrovascular accident) 05/15/2016  . Hypoglycemia, coma ([place]) 11/02/2015  . Narcotic dependency, continuous ([place]) 11/02/2015  . Uncontrolled type 2 diabetes mellitus with diabetic nephropathy, with long-term current use of insulin ([place]) 08/12/2015  . Depression 08/12/2015  . CKD (chronic kidney disease) stage 3, GFR 30-59 ml/min 03/20/2014    Past Surgical History:  Procedure Laterality Date  . HIP ARTHROPLASTY Right 02/05/2013   Procedure: ARTHROPLASTY BIPOLAR HIP;  Surgeon: [name], MD;  Location: [facility];  Service: Orthopedics;  Laterality: Right;  . INTRAMEDULLARY (IM) NAIL INTERTROCHANTERIC Left 06/16/2016   Procedure: INTRAMEDULLARY (IM) NAIL INTERTROCHANTRIC;  Surgeon: Rod Can, MD;  Location: [facility];  Service: Orthopedics;  Laterality: Left;       Home Medications    Prior to Admission medications   Medication Sig Start Date End Date Taking? Authorizing Provider  acetaminophen (TYLENOL) 325 MG tablet Take 2 tablets (650 mg total) by mouth every 6 (six) hours as needed for mild pain (or Fever >/= 101). Patient not taking: Reported on 12/14/2016 08/14/15   Robbie Lis, MD  amLODipine (NORVASC) 10 MG tablet Take 1 tablet (10 mg total) by mouth daily. 08/14/15   Robbie Lis, MD  aspirin EC 81 MG EC  tablet Take 1 tablet (81 mg total) by mouth daily. 11/20/16   Shanker Kristeen Mans, MD  carvedilol (COREG) 6.25 MG tablet Take 1 tablet (6.25 mg total) by mouth 2 (two) times daily with a meal. 05/12/16   Thurnell Lose, MD  cyanocobalamin (,VITAMIN B-12,) 1000 MCG/ML injection Inject 1 mL (1,000 mcg total) into the muscle every 30 (thirty) days. Patient not taking:  Reported on 11/16/2016 06/20/16   Venetia Maxon Rama, MD  FLUoxetine (PROZAC) 20 MG capsule Take 20 mg by mouth daily.    Historical Provider, MD  glucose blood (TRUE METRIX BLOOD GLUCOSE TEST) test strip Use to check blood sugars three times a day Dx E11.8 12/24/16   Golden Circle, [name]  insulin aspart (NOVOLOG) 100 UNIT/ML injection Inject 0-15 Units into the skin See admin instructions. 0-15 Units, Subcutaneous, 3 times daily with meals CBG < 70: implement hypoglycemia protocol CBG 70 - 120: 0 units CBG 121 - 150: 2 units CBG 151 - 200: 3 units CBG 201 - 250: 5 units CBG 251 - 300: 8 units CBG 301 - 350: 11 units CBG 351 - 400: 15 units CBG > 400: call MD 12/30/16   Orson Eva, MD  insulin glargine (LANTUS) 100 UNIT/ML injection Inject 0.1 mLs (10 Units total) into the skin daily. 12/31/16   Orson Eva, MD    Family History Family History  Problem Relation Age of Onset  . Diabetes Mother   . Alzheimer's disease Mother   . Hypertension Mother   . Hyperlipidemia Father   . Hypertension Father   . Healthy Maternal Grandmother   . Pneumonia Maternal Grandfather     Social History Social History  Substance Use Topics  . Smoking status: Former Smoker    Packs/day: 0.25    Years: 30.00    Types: Cigarettes    Quit date: 01/31/2013  . Smokeless tobacco: Never Used  . Alcohol use No     Allergies   Patient has no known allergies.   Review of Systems Review of Systems  Constitutional: Negative for chills and fever.  HENT: Negative for congestion and facial swelling.   Eyes: Negative for discharge and visual disturbance.  Respiratory: Negative for shortness of breath.   Cardiovascular: Negative for chest pain and palpitations.  Gastrointestinal: Negative for abdominal pain, diarrhea and vomiting.  Musculoskeletal: Negative for arthralgias and myalgias.  Skin: Negative for color change and rash.  Neurological: Positive for weakness and light-headedness. Negative for tremors,  syncope and headaches.  Psychiatric/Behavioral: Negative for confusion and dysphoric mood.     Physical Exam Updated Vital Signs BP 116/63   Pulse 69   Temp 99.1 F (37.3 C) (Oral)   Resp (!) 23   Ht 5\' 9"  (1.753 m)   Wt 130 lb (59 kg)   SpO2 99%   BMI 19.20 kg/m   Physical Exam  Constitutional: He is oriented to person, place, and time. He appears well-developed and well-nourished.  HENT:  Head: Normocephalic and atraumatic.  Eyes: EOM are normal. Pupils are equal, round, and reactive to light.  Neck: Normal range of motion. Neck supple. No JVD present.  Cardiovascular: Normal rate and regular rhythm.  Exam reveals no gallop and no friction rub.   No murmur heard. Pulmonary/Chest: No respiratory distress. He has no wheezes.  Abdominal: He exhibits no distension and no mass. There is no tenderness. There is no rebound and no guarding.  Musculoskeletal: Normal range of motion.  Left tibial io inplace  Neurological:  He is alert and oriented to person, place, and time.  Skin: No rash noted. No pallor.  Psychiatric: He has a normal mood and affect. His behavior is normal.  Nursing note and vitals reviewed.    ED Treatments / Results  Labs (all labs ordered are listed, but only abnormal results are displayed) Labs Reviewed  CBC WITH DIFFERENTIAL/PLATELET - Abnormal; Notable for the following:       Result Value   RBC 3.43 (*)    Hemoglobin 10.5 (*)    HCT 31.9 (*)    All other components within normal limits  CBG MONITORING, ED - Abnormal; Notable for the following:    Glucose-Capillary 59 (*)    All other components within normal limits  I-STAT CHEM 8, ED - Abnormal; Notable for the following:    Hemoglobin 10.9 (*)    HCT 32.0 (*)    All other components within normal limits  BASIC METABOLIC PANEL  CBG MONITORING, ED  I-STAT TROPOININ, ED  CBG MONITORING, ED    EKG  EKG Interpretation None       Radiology No results found.  Procedures Procedures  (including critical care time)  Medications Ordered in ED Medications  acetaminophen (TYLENOL) tablet 1,000 mg (1,000 mg Oral Given 01/02/17 1350)     Initial Impression / Assessment and Plan / ED Course  I have reviewed the triage vital signs and the nursing notes.  Pertinent labs & imaging results that were available during my care of the patient were reviewed by me and considered in my medical decision making (see chart for details).     71 yo M With a chief complaint of hypoglycemia. Patient states that he took his NovoLog and then forgot to eat. Denies any other complaints. Discussed lab evaluation with the patient is currently declining. Will observe in the ED for about an hour feed him and then reassess.  Further history obtained from the family. They gave him a shot of NovoLog as well as his Lantus this morning. After an hour patient had a sandwich bag and was noted to be hypoglycemic still will give the patient a food tray. Check basic labs.  Blood sugar improved, d/c home.   4:07 PM:  I have discussed the diagnosis/risks/treatment options with the patient and family and believe the pt to be eligible for discharge home to follow-up with PCP. We also discussed returning to the ED immediately if new or worsening sx occur. We discussed the sx which are most concerning (e.g., sudden worsening pain, fever, inability to tolerate by mouth) that necessitate immediate return. Medications administered to the patient during their visit and any new prescriptions provided to the patient are listed below.  Medications given during this visit Medications  acetaminophen (TYLENOL) tablet 1,000 mg (1,000 mg Oral Given 01/02/17 1350)     The patient appears reasonably screen and/or stabilized for discharge and I doubt any other medical condition or other [facility] requiring further screening, evaluation, or treatment in the ED at this time prior to discharge.    Final Clinical Impressions(s) / ED  Diagnoses   Final diagnoses:  Hypoglycemia    New Prescriptions Discharge Medication List as of 01/02/2017  3:28 PM       Deno Etienne, DO 01/02/17 1608

## 2017-01-02 NOTE — ED Notes (Signed)
Pt ambulating independently w/ steady gait on d/c in no acute distress, A&Ox4.D/c instructions reviewed w/ pt and family - pt and family deny any further questions or concerns at present.  

## 2017-01-07 IMAGING — CR DG CHEST 1V PORT
1 series · 1 of 1 positions shown · non-contrast
Comparison: August 08, 2015.

CLINICAL DATA: Altered mental status.

EXAM:
PORTABLE CHEST 1 VIEW

[AP]
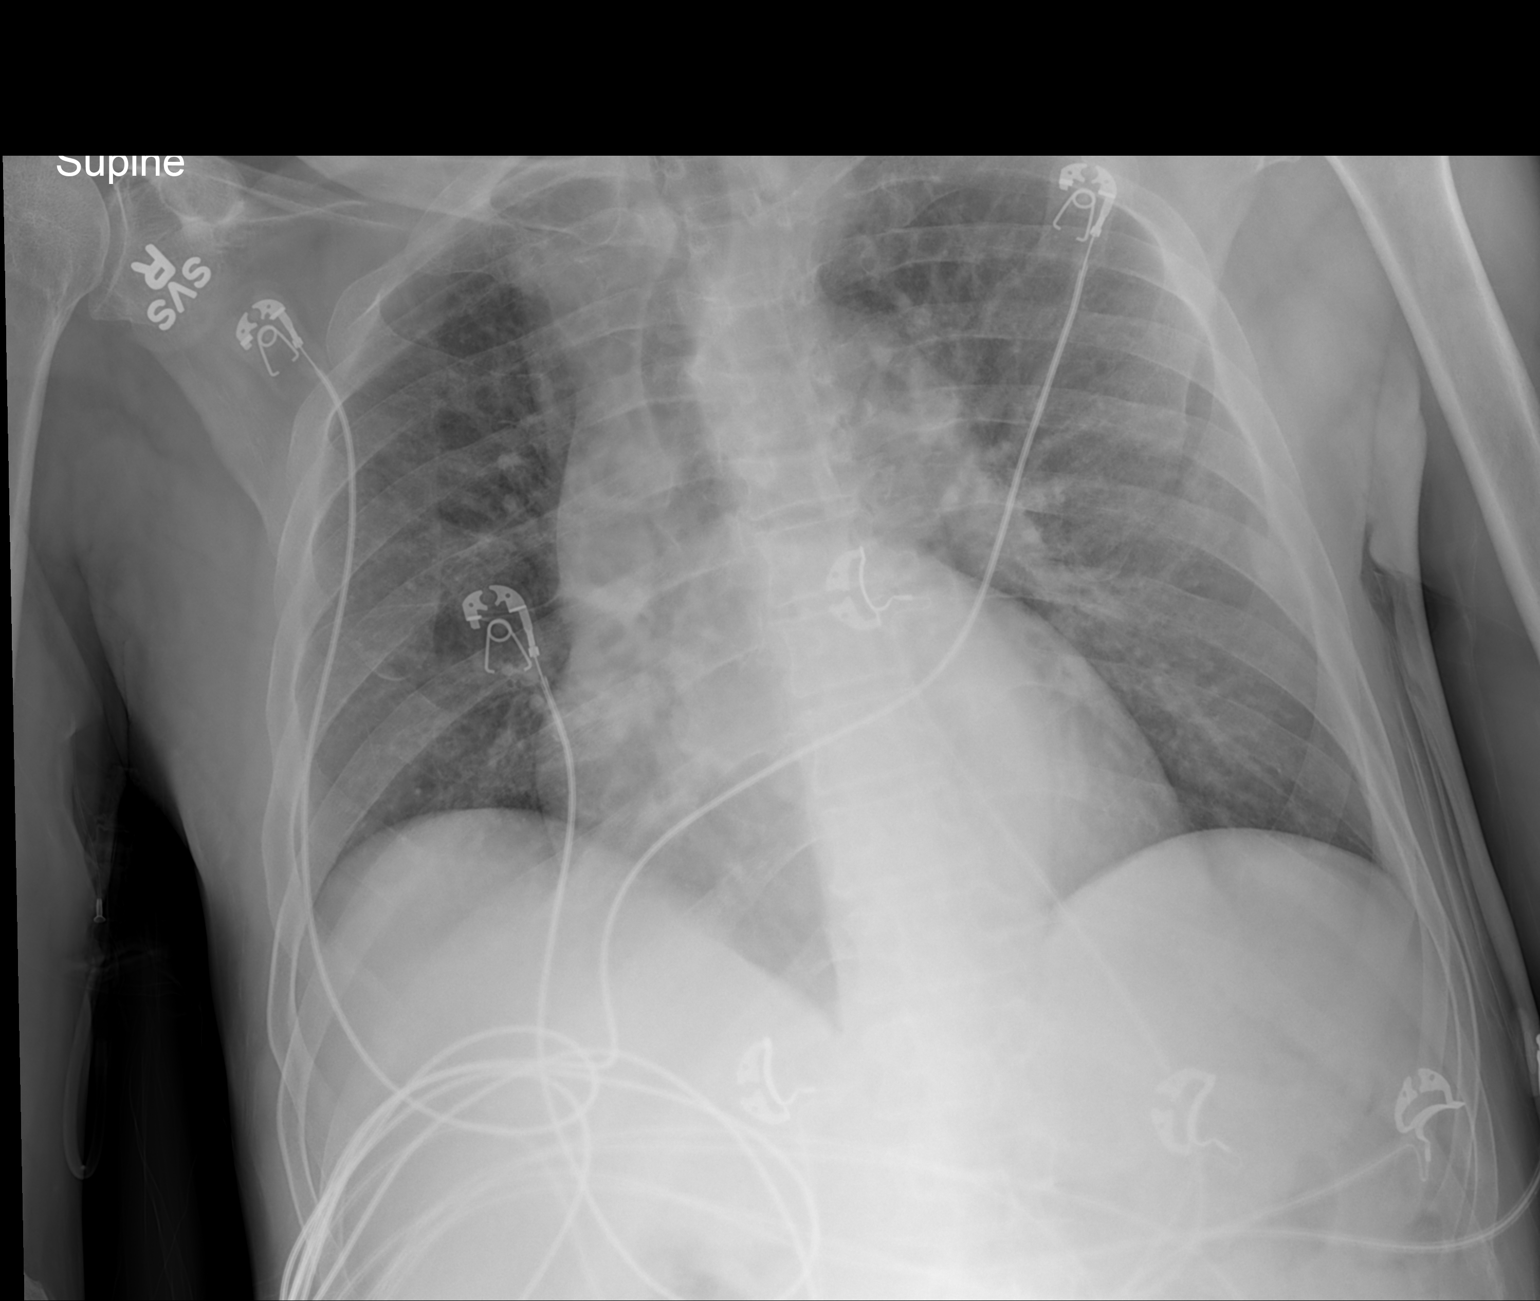

[1 of 1 positions shown; findings below may reference images not displayed]

FINDINGS: Stable cardiomediastinal silhouette. No pneumothorax or pleural
effusion is noted. No acute pulmonary disease is noted. Bony thorax
is intact.
IMPRESSION: No acute cardiopulmonary abnormality seen.

## 2017-01-08 IMAGING — CT CT CHEST W/ CM
2 of 3 series · 15 of 36 positions shown, 18 images · IV contrast (OMNIPAQUE)
Comparison: None.

CLINICAL DATA: Shortness of breath.  Evaluate for lung nodule.

EXAM:
CT CHEST WITH CONTRAST
TECHNIQUE: Multidetector CT imaging of the chest was performed during
intravenous contrast administration.
CONTRAST:  75mL OMNIPAQUE IOHEXOL 300 MG/ML  SOLN

[Series 2: chest with st · axial · 0.68mm/px · z∈[-292,-32]mm · 12 of 62 slices shown, 15 images]
[im 5/62  mediastinal]
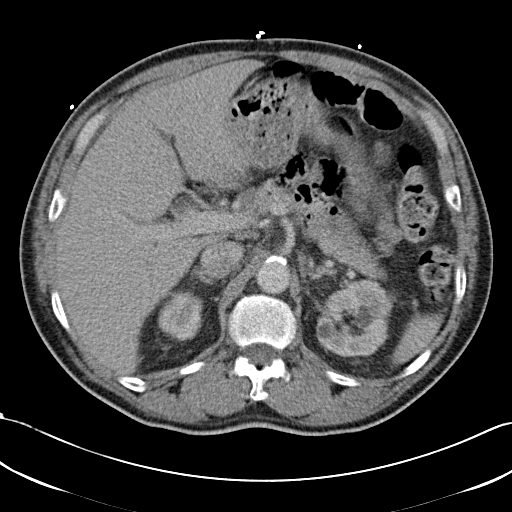
[im 5/62  lung]
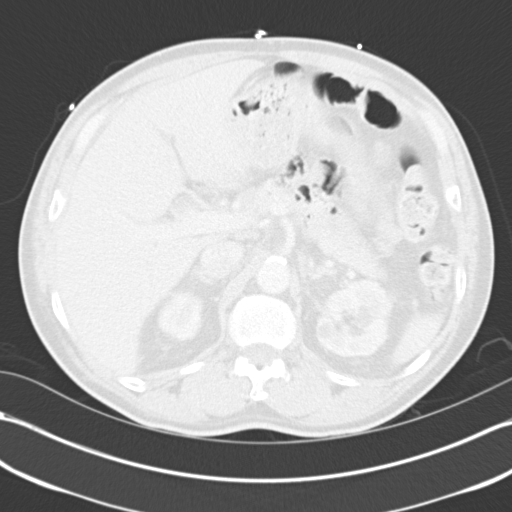
[im 10/62  lung]
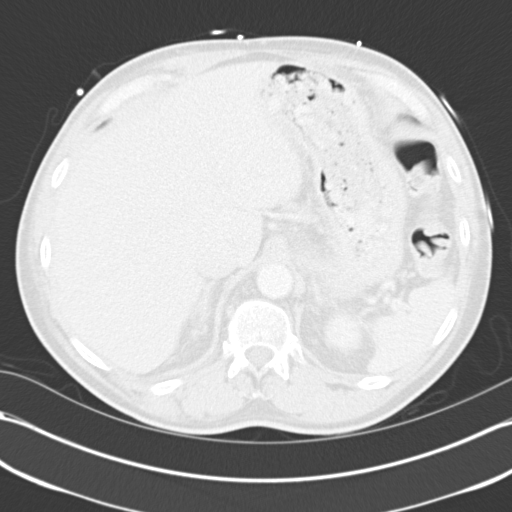
[im 14/62  lung]
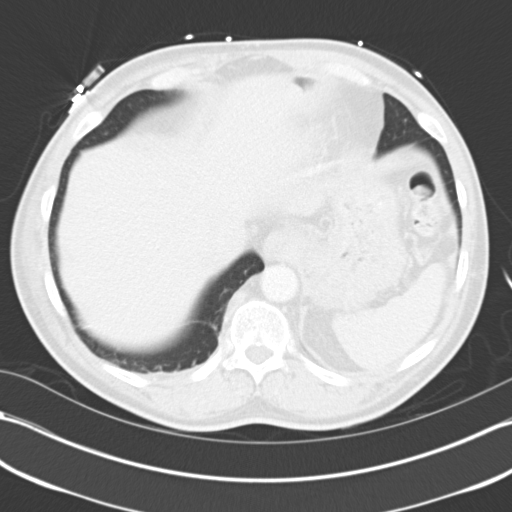
[im 19/62  lung]
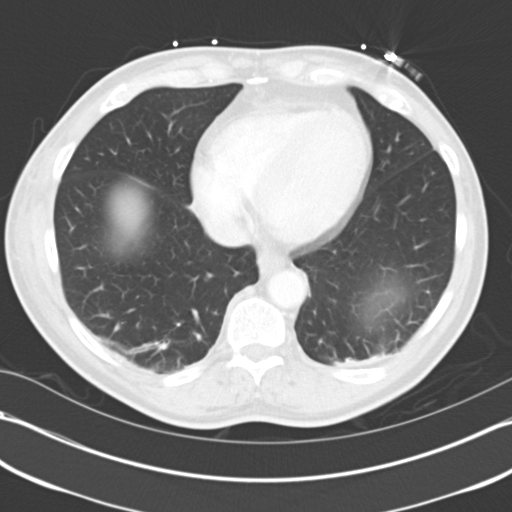
[im 23/62  mediastinal]
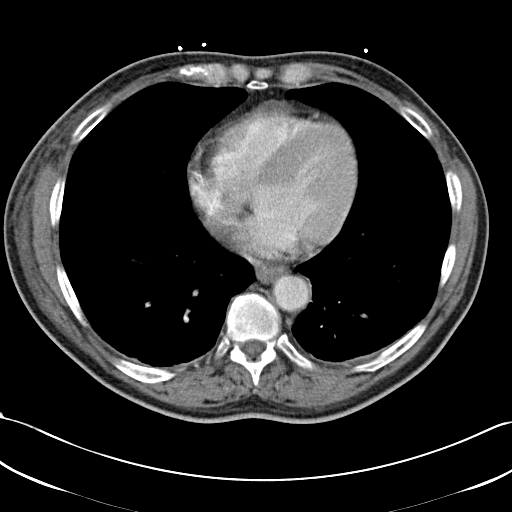
[im 23/62  lung]
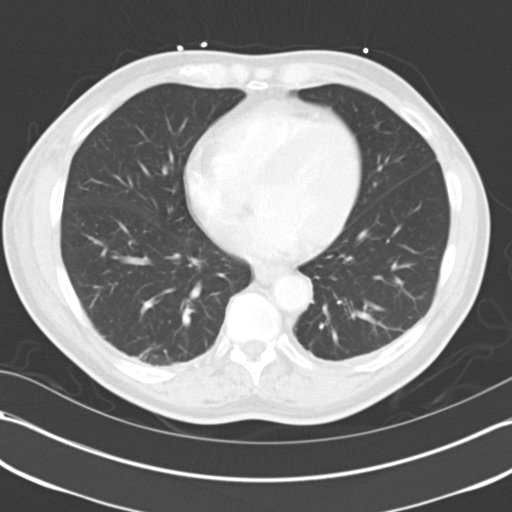
[im 28/62  lung]
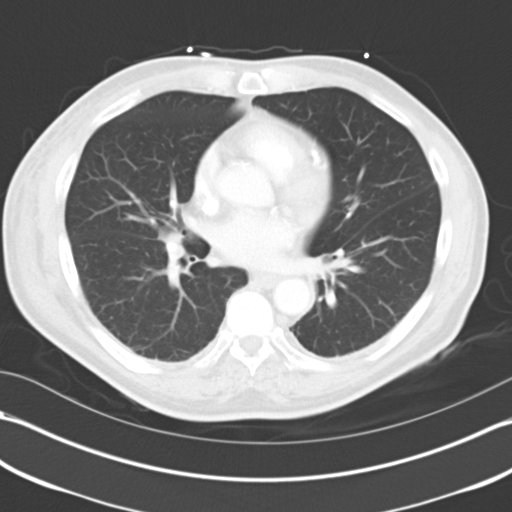
[im 34/62  lung]
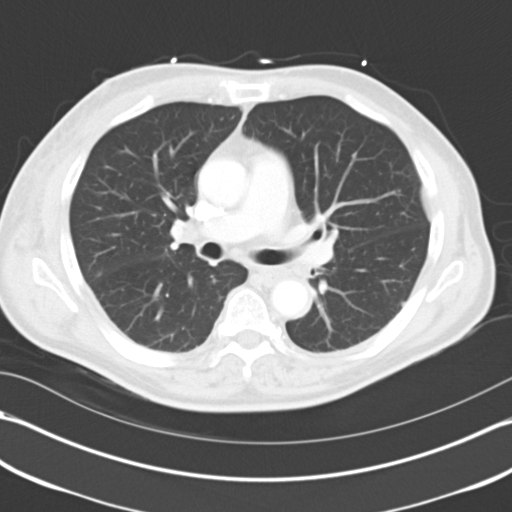
[im 39/62  lung]
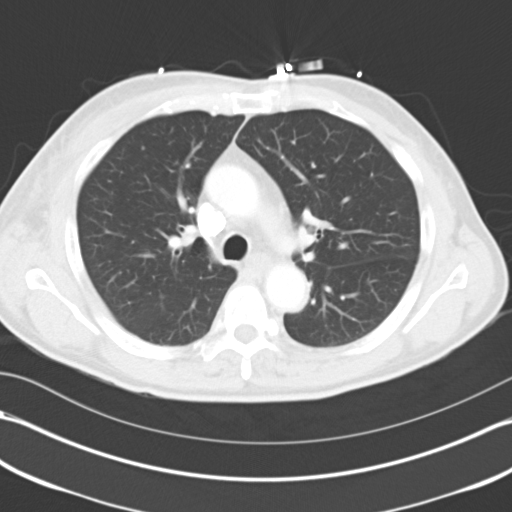
[im 43/62  mediastinal]
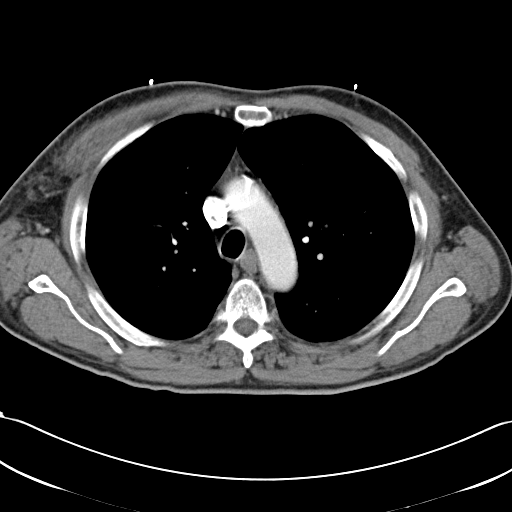
[im 43/62  lung]
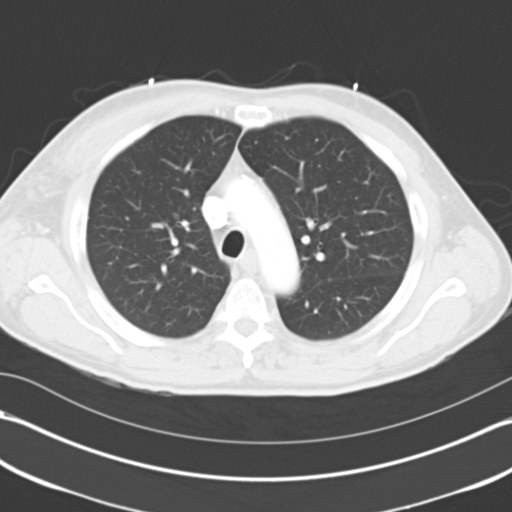
[im 48/62  lung]
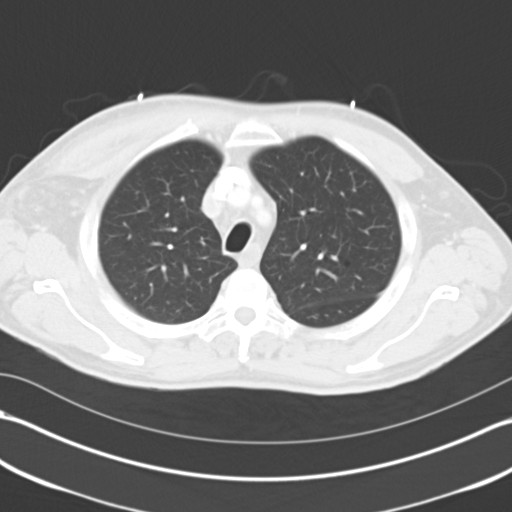
[im 52/62  lung]
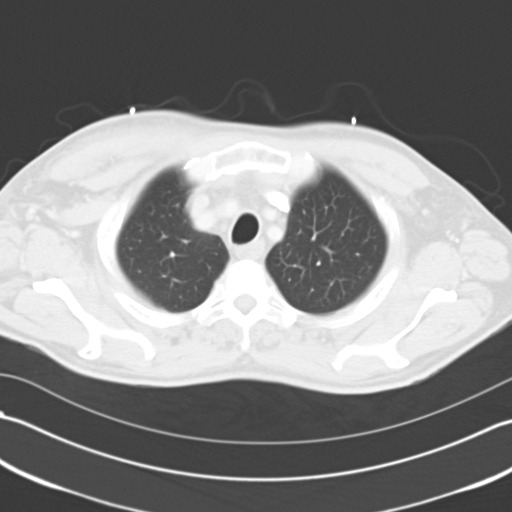
[im 57/62  lung]
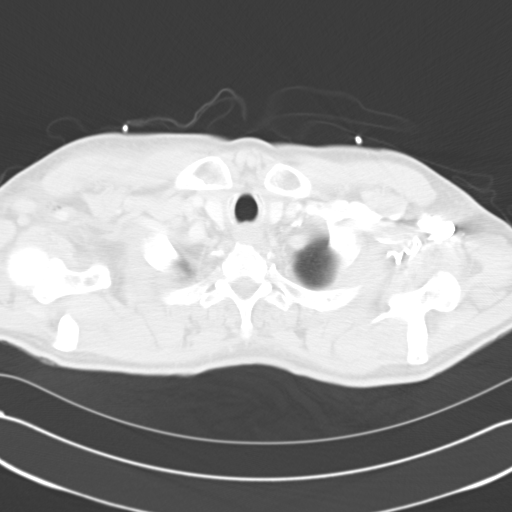

[Series 602: <mpr thick range> · coronal · 0.68mm/px · 3 of 86 slices shown]
[im 18/86  lung]
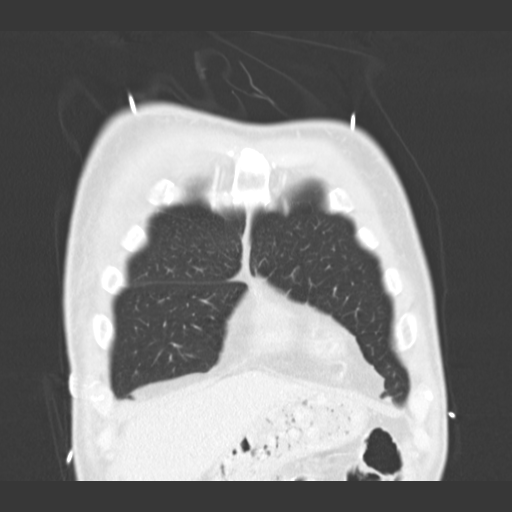
[im 35/86  lung]
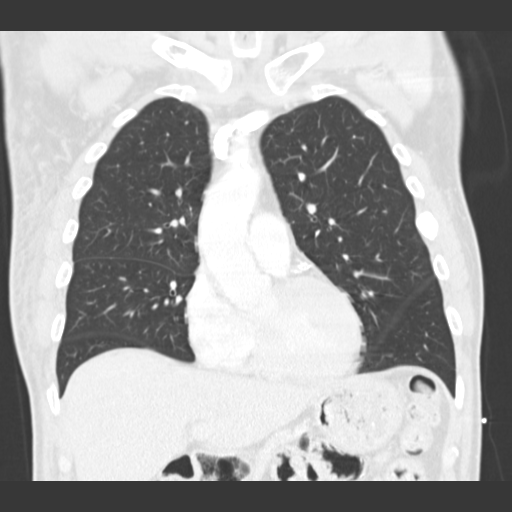
[im 52/86  lung]
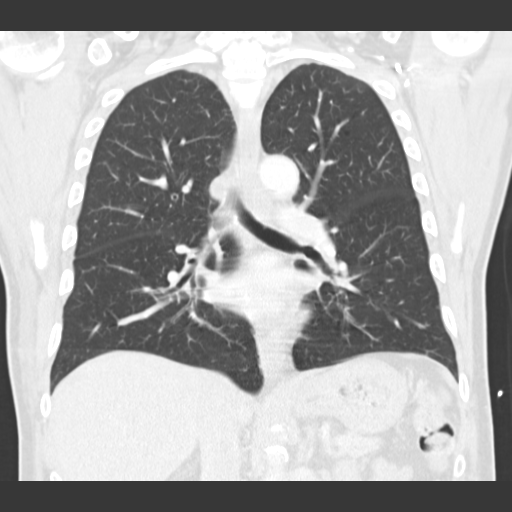

[15 of 36 positions shown; findings below may reference images not displayed]

FINDINGS: There is stranding within the soft tissues in the right axilla.
Small locules of gas are noted adjacent to the right axillary
artery. This stranding continues inferiorly in the subcutaneous soft
tissues of the right chest wall. This is of unknown etiology.

Linear subsegmental atelectasis or scarring in the lung bases. Lungs
otherwise clear. No effusions. No suspicious pulmonary nodules.

Heart is normal size. Aorta is normal caliber. Coronary artery
calcifications. No mediastinal, hilar, or axillary adenopathy.

Imaging into the upper abdomen shows no acute findings.
IMPRESSION: Stranding within the right axilla and right chest wall subcutaneous
soft tissues. Small locules of gas adjacent to the right axillary
artery. This is of unknown etiology. Has there been attempted line
placement?

Minimal subsegmental atelectasis or scarring in the bases.

Coronary artery disease.

## 2017-01-16 ENCOUNTER — Encounter (HOSPITAL_COMMUNITY): Payer: Self-pay | Admitting: *Deleted

## 2017-01-16 ENCOUNTER — Observation Stay (HOSPITAL_COMMUNITY)
Admission: EM | Admit: 2017-01-16 | Discharge: 2017-01-20 | Disposition: A | Discharge status: Home / Self Care | Payer: Medicare HMO | Attending: Internal Medicine | Admitting: Internal Medicine

## 2017-01-16 ENCOUNTER — Encounter (HOSPITAL_COMMUNITY): Payer: Self-pay | Admitting: Emergency Medicine

## 2017-01-16 ENCOUNTER — Emergency Department (HOSPITAL_COMMUNITY)
Admission: EM | Admit: 2017-01-16 | Discharge: 2017-01-16 | Discharge status: Against Medical Advice | Payer: Medicare HMO | Source: Home / Self Care | Attending: Emergency Medicine | Admitting: Emergency Medicine

## 2017-01-16 DIAGNOSIS — G92 Toxic encephalopathy: Secondary | ICD-10-CM | POA: Insufficient documentation

## 2017-01-16 DIAGNOSIS — E538 Deficiency of other specified B group vitamins: Secondary | ICD-10-CM | POA: Insufficient documentation

## 2017-01-16 DIAGNOSIS — R739 Hyperglycemia, unspecified: Secondary | ICD-10-CM

## 2017-01-16 DIAGNOSIS — E11649 Type 2 diabetes mellitus with hypoglycemia without coma: Secondary | ICD-10-CM | POA: Insufficient documentation

## 2017-01-16 DIAGNOSIS — G9341 Metabolic encephalopathy: Secondary | ICD-10-CM | POA: Diagnosis present

## 2017-01-16 DIAGNOSIS — N390 Urinary tract infection, site not specified: Secondary | ICD-10-CM

## 2017-01-16 DIAGNOSIS — M549 Dorsalgia, unspecified: Secondary | ICD-10-CM | POA: Insufficient documentation

## 2017-01-16 DIAGNOSIS — B961 Klebsiella pneumoniae [K. pneumoniae] as the cause of diseases classified elsewhere: Secondary | ICD-10-CM | POA: Diagnosis not present

## 2017-01-16 DIAGNOSIS — Z5320 Procedure and treatment not carried out because of patient's decision for unspecified reasons: Secondary | ICD-10-CM | POA: Insufficient documentation

## 2017-01-16 DIAGNOSIS — Z993 Dependence on wheelchair: Secondary | ICD-10-CM | POA: Diagnosis not present

## 2017-01-16 DIAGNOSIS — E118 Type 2 diabetes mellitus with unspecified complications: Secondary | ICD-10-CM

## 2017-01-16 DIAGNOSIS — D631 Anemia in chronic kidney disease: Secondary | ICD-10-CM | POA: Diagnosis not present

## 2017-01-16 DIAGNOSIS — G894 Chronic pain syndrome: Secondary | ICD-10-CM | POA: Diagnosis not present

## 2017-01-16 DIAGNOSIS — Z5329 Procedure and treatment not carried out because of patient's decision for other reasons: Secondary | ICD-10-CM

## 2017-01-16 DIAGNOSIS — E1165 Type 2 diabetes mellitus with hyperglycemia: Secondary | ICD-10-CM | POA: Insufficient documentation

## 2017-01-16 DIAGNOSIS — E1122 Type 2 diabetes mellitus with diabetic chronic kidney disease: Secondary | ICD-10-CM

## 2017-01-16 DIAGNOSIS — E875 Hyperkalemia: Secondary | ICD-10-CM | POA: Diagnosis present

## 2017-01-16 DIAGNOSIS — E1121 Type 2 diabetes mellitus with diabetic nephropathy: Secondary | ICD-10-CM | POA: Insufficient documentation

## 2017-01-16 DIAGNOSIS — Z79899 Other long term (current) drug therapy: Secondary | ICD-10-CM | POA: Insufficient documentation

## 2017-01-16 DIAGNOSIS — I129 Hypertensive chronic kidney disease with stage 1 through stage 4 chronic kidney disease, or unspecified chronic kidney disease: Secondary | ICD-10-CM | POA: Insufficient documentation

## 2017-01-16 DIAGNOSIS — R404 Transient alteration of awareness: Secondary | ICD-10-CM

## 2017-01-16 DIAGNOSIS — Z87891 Personal history of nicotine dependence: Secondary | ICD-10-CM | POA: Insufficient documentation

## 2017-01-16 DIAGNOSIS — E860 Dehydration: Secondary | ICD-10-CM

## 2017-01-16 DIAGNOSIS — IMO0002 Reserved for concepts with insufficient information to code with codable children: Secondary | ICD-10-CM

## 2017-01-16 DIAGNOSIS — Z8673 Personal history of transient ischemic attack (TIA), and cerebral infarction without residual deficits: Secondary | ICD-10-CM | POA: Insufficient documentation

## 2017-01-16 DIAGNOSIS — N183 Chronic kidney disease, stage 3 unspecified: Secondary | ICD-10-CM | POA: Diagnosis present

## 2017-01-16 DIAGNOSIS — F1120 Opioid dependence, uncomplicated: Secondary | ICD-10-CM | POA: Insufficient documentation

## 2017-01-16 DIAGNOSIS — F329 Major depressive disorder, single episode, unspecified: Secondary | ICD-10-CM | POA: Diagnosis not present

## 2017-01-16 DIAGNOSIS — N184 Chronic kidney disease, stage 4 (severe): Secondary | ICD-10-CM | POA: Insufficient documentation

## 2017-01-16 DIAGNOSIS — A4159 Other Gram-negative sepsis: Secondary | ICD-10-CM | POA: Diagnosis not present

## 2017-01-16 DIAGNOSIS — Z7982 Long term (current) use of aspirin: Secondary | ICD-10-CM | POA: Insufficient documentation

## 2017-01-16 DIAGNOSIS — R4189 Other symptoms and signs involving cognitive functions and awareness: Secondary | ICD-10-CM

## 2017-01-16 DIAGNOSIS — Z794 Long term (current) use of insulin: Secondary | ICD-10-CM | POA: Insufficient documentation

## 2017-01-16 DIAGNOSIS — N179 Acute kidney failure, unspecified: Secondary | ICD-10-CM | POA: Diagnosis not present

## 2017-01-16 DIAGNOSIS — F1110 Opioid abuse, uncomplicated: Secondary | ICD-10-CM

## 2017-01-16 DIAGNOSIS — I69328 Other speech and language deficits following cerebral infarction: Secondary | ICD-10-CM | POA: Diagnosis not present

## 2017-01-16 DIAGNOSIS — Z96641 Presence of right artificial hip joint: Secondary | ICD-10-CM

## 2017-01-16 DIAGNOSIS — Z9114 Patient's other noncompliance with medication regimen: Secondary | ICD-10-CM | POA: Diagnosis not present

## 2017-01-16 DIAGNOSIS — F1910 Other psychoactive substance abuse, uncomplicated: Secondary | ICD-10-CM

## 2017-01-16 LAB — BASIC METABOLIC PANEL
Anion gap: 12 (ref 5–15)
BUN: 44 mg/dL — ABNORMAL HIGH (ref 6–20)
CO2: 20 mmol/L — ABNORMAL LOW (ref 22–32)
Calcium: 9.1 mg/dL (ref 8.9–10.3)
Chloride: 102 mmol/L (ref 101–111)
Creatinine, Ser: 1.91 mg/dL — ABNORMAL HIGH (ref 0.61–1.24)
GFR calc Af Amer: 39 mL/min — ABNORMAL LOW (ref >60)
GFR calc non Af Amer: 34 mL/min — ABNORMAL LOW (ref >60)
Glucose, Bld: 493 mg/dL — ABNORMAL HIGH (ref 65–99)
Potassium: 5.6 mmol/L — ABNORMAL HIGH (ref 3.5–5.1)
Sodium: 134 mmol/L — ABNORMAL LOW (ref 135–145)

## 2017-01-16 LAB — COMPREHENSIVE METABOLIC PANEL
ALT: 13 U/L — ABNORMAL LOW (ref 17–63)
AST: 22 U/L (ref 15–41)
Albumin: 3.5 g/dL (ref 3.5–5.0)
Alkaline Phosphatase: 159 U/L — ABNORMAL HIGH (ref 38–126)
Anion gap: 12 (ref 5–15)
BUN: 44 mg/dL — ABNORMAL HIGH (ref 6–20)
CO2: 22 mmol/L (ref 22–32)
Calcium: 9.2 mg/dL (ref 8.9–10.3)
Chloride: 104 mmol/L (ref 101–111)
Creatinine, Ser: 1.88 mg/dL — ABNORMAL HIGH (ref 0.61–1.24)
GFR calc Af Amer: 40 mL/min — ABNORMAL LOW (ref >60)
GFR calc non Af Amer: 34 mL/min — ABNORMAL LOW (ref >60)
Glucose, Bld: 386 mg/dL — ABNORMAL HIGH (ref 65–99)
Potassium: 5.6 mmol/L — ABNORMAL HIGH (ref 3.5–5.1)
Sodium: 138 mmol/L (ref 135–145)
Total Bilirubin: 0.7 mg/dL (ref 0.3–1.2)
Total Protein: 8 g/dL (ref 6.5–8.1)

## 2017-01-16 LAB — RAPID URINE DRUG SCREEN, HOSP PERFORMED
Amphetamines: NOT DETECTED
Amphetamines: NOT DETECTED
Barbiturates: NOT DETECTED
Barbiturates: NOT DETECTED
Benzodiazepines: NOT DETECTED
Benzodiazepines: NOT DETECTED
Cocaine: NOT DETECTED
Cocaine: NOT DETECTED
Opiates: POSITIVE — AB
Opiates: POSITIVE — AB
Tetrahydrocannabinol: NOT DETECTED
Tetrahydrocannabinol: NOT DETECTED

## 2017-01-16 LAB — CBC WITH DIFFERENTIAL/PLATELET
Basophils Absolute: 0 10*3/uL (ref 0.0–0.1)
Basophils Relative: 0 %
Eosinophils Absolute: 0.1 10*3/uL (ref 0.0–0.7)
Eosinophils Relative: 1 %
HCT: 29.6 % — ABNORMAL LOW (ref 39.0–52.0)
Hemoglobin: 9.7 g/dL — ABNORMAL LOW (ref 13.0–17.0)
Lymphocytes Relative: 8 %
Lymphs Abs: 0.9 10*3/uL (ref 0.7–4.0)
MCH: 31.3 pg (ref 26.0–34.0)
MCHC: 32.8 g/dL (ref 30.0–36.0)
MCV: 95.5 fL (ref 78.0–100.0)
Monocytes Absolute: 0.6 10*3/uL (ref 0.1–1.0)
Monocytes Relative: 5 %
Neutro Abs: 10.3 10*3/uL — ABNORMAL HIGH (ref 1.7–7.7)
Neutrophils Relative %: 86 %
Platelets: 311 10*3/uL (ref 150–400)
RBC: 3.1 MIL/uL — ABNORMAL LOW (ref 4.22–5.81)
RDW: 14.5 % (ref 11.5–15.5)
WBC: 11.9 10*3/uL — ABNORMAL HIGH (ref 4.0–10.5)

## 2017-01-16 LAB — URINALYSIS, ROUTINE W REFLEX MICROSCOPIC
Bilirubin Urine: NEGATIVE
Glucose, UA: >=500 mg/dL — AB
Ketones, ur: 5 mg/dL — AB
Nitrite: POSITIVE — AB
Protein, ur: 30 mg/dL — AB
Specific Gravity, Urine: 1.014 (ref 1.005–1.030)
pH: 5 (ref 5.0–8.0)

## 2017-01-16 LAB — CBG MONITORING, ED
Glucose-Capillary: 404 mg/dL — ABNORMAL HIGH (ref 65–99)
Glucose-Capillary: 557 mg/dL (ref 65–99)
Glucose-Capillary: 423 mg/dL — ABNORMAL HIGH (ref 65–99)
Glucose-Capillary: 343 mg/dL — ABNORMAL HIGH (ref 65–99)

## 2017-01-16 LAB — GLUCOSE, CAPILLARY: Glucose-Capillary: 454 mg/dL — ABNORMAL HIGH (ref 65–99)

## 2017-01-16 LAB — CREATININE, URINE, RANDOM: Creatinine, Urine: 49.95 mg/dL

## 2017-01-16 LAB — SODIUM, URINE, RANDOM: Sodium, Ur: 44 mmol/L

## 2017-01-16 LAB — LACTIC ACID, PLASMA: Lactic Acid, Venous: 3.5 mmol/L (ref 0.5–1.9)

## 2017-01-16 MED ORDER — SODIUM CHLORIDE 0.9 % IV SOLN
INTRAVENOUS | Status: AC
  Administered 2017-01-16: 23:00:00 via INTRAVENOUS

## 2017-01-16 MED ORDER — DEXTROSE 5 % IV SOLN
1.0000 g | INTRAVENOUS | Status: DC
  Administered 2017-01-16 – 2017-01-18 (×3): 1 g via INTRAVENOUS
  Filled 2017-01-16 (×3): qty 10

## 2017-01-16 MED ORDER — SODIUM CHLORIDE 0.9% FLUSH
3.0000 mL | Freq: Two times a day (BID) | INTRAVENOUS | Status: DC
  Administered 2017-01-16 – 2017-01-20 (×2): 3 mL via INTRAVENOUS

## 2017-01-16 MED ORDER — FLUOXETINE HCL 20 MG PO CAPS
20.0000 mg | ORAL_CAPSULE | Freq: Every day | ORAL | Status: DC
Start: 2017-01-17 — End: 2017-01-20
  Administered 2017-01-17 – 2017-01-20 (×4): 20 mg via ORAL
  Filled 2017-01-16 (×4): qty 1

## 2017-01-16 MED ORDER — SODIUM CHLORIDE 0.9 % IV BOLUS (SEPSIS)
1000.0000 mL | Freq: Once | INTRAVENOUS | Status: AC
  Administered 2017-01-16: 1000 mL via INTRAVENOUS

## 2017-01-16 MED ORDER — SODIUM POLYSTYRENE SULFONATE 15 GM/60ML PO SUSP
15.0000 g | Freq: Once | ORAL | Status: AC
  Administered 2017-01-16: 15 g via ORAL
  Filled 2017-01-16: qty 60

## 2017-01-16 MED ORDER — ASPIRIN EC 81 MG PO TBEC
81.0000 mg | DELAYED_RELEASE_TABLET | Freq: Every day | ORAL | Status: DC
  Administered 2017-01-17 – 2017-01-20 (×4): 81 mg via ORAL
  Filled 2017-01-16 (×4): qty 1

## 2017-01-16 MED ORDER — INSULIN GLARGINE 100 UNIT/ML ~~LOC~~ SOLN
10.0000 [IU] | Freq: Every day | SUBCUTANEOUS | Status: DC
  Administered 2017-01-16: 10 [IU] via SUBCUTANEOUS
  Filled 2017-01-16: qty 0.1

## 2017-01-16 MED ORDER — ENOXAPARIN SODIUM 30 MG/0.3ML ~~LOC~~ SOLN
30.0000 mg | Freq: Every day | SUBCUTANEOUS | Status: DC
  Administered 2017-01-16: 30 mg via SUBCUTANEOUS
  Filled 2017-01-16: qty 0.3

## 2017-01-16 MED ORDER — CEPHALEXIN 500 MG PO CAPS
500.0000 mg | ORAL_CAPSULE | Freq: Once | ORAL | Status: DC
  Filled 2017-01-16: qty 1

## 2017-01-16 MED ORDER — ACETAMINOPHEN 325 MG PO TABS
650.0000 mg | ORAL_TABLET | Freq: Four times a day (QID) | ORAL | Status: DC | PRN
  Administered 2017-01-17: 650 mg via ORAL
  Filled 2017-01-16: qty 2

## 2017-01-16 MED ORDER — AMLODIPINE BESYLATE 10 MG PO TABS
10.0000 mg | ORAL_TABLET | Freq: Every day | ORAL | Status: DC
  Administered 2017-01-17 – 2017-01-20 (×4): 10 mg via ORAL
  Filled 2017-01-16 (×4): qty 1

## 2017-01-16 MED ORDER — NALOXONE HCL 2 MG/2ML IJ SOSY
2.0000 mg | PREFILLED_SYRINGE | Freq: Once | INTRAMUSCULAR | Status: AC
  Administered 2017-01-16: 2 mg via INTRAVENOUS
  Filled 2017-01-16: qty 2

## 2017-01-16 MED ORDER — BISACODYL 10 MG RE SUPP: 10.0000 mg | Freq: Every day | RECTAL | Status: DC | PRN

## 2017-01-16 MED ORDER — INSULIN ASPART 100 UNIT/ML ~~LOC~~ SOLN
0.0000 [IU] | Freq: Every day | SUBCUTANEOUS | Status: DC
  Administered 2017-01-16: 10 [IU] via SUBCUTANEOUS
  Administered 2017-01-17: 2 [IU] via SUBCUTANEOUS
  Administered 2017-01-18: 3 [IU] via SUBCUTANEOUS
  Administered 2017-01-19: 2 [IU] via SUBCUTANEOUS

## 2017-01-16 MED ORDER — CEPHALEXIN 500 MG PO CAPS: 500.0000 mg | ORAL_CAPSULE | Freq: Three times a day (TID) | ORAL | 0 refills | Status: DC

## 2017-01-16 MED ORDER — ENSURE ENLIVE PO LIQD
237.0000 mL | Freq: Two times a day (BID) | ORAL | Status: DC
  Administered 2017-01-17 – 2017-01-20 (×7): 237 mL via ORAL

## 2017-01-16 MED ORDER — INSULIN ASPART 100 UNIT/ML ~~LOC~~ SOLN
10.0000 [IU] | Freq: Once | SUBCUTANEOUS | Status: AC
  Administered 2017-01-16: 10 [IU] via INTRAVENOUS
  Filled 2017-01-16: qty 1

## 2017-01-16 MED ORDER — CARVEDILOL 6.25 MG PO TABS
6.2500 mg | ORAL_TABLET | Freq: Two times a day (BID) | ORAL | Status: DC
  Administered 2017-01-17 – 2017-01-20 (×7): 6.25 mg via ORAL
  Filled 2017-01-16 (×7): qty 1

## 2017-01-16 MED ORDER — ONDANSETRON HCL 4 MG PO TABS: 4.0000 mg | ORAL_TABLET | Freq: Four times a day (QID) | ORAL | Status: DC | PRN

## 2017-01-16 MED ORDER — VITAMIN B-1 100 MG PO TABS
100.0000 mg | ORAL_TABLET | Freq: Every day | ORAL | Status: DC
  Administered 2017-01-17 – 2017-01-20 (×4): 100 mg via ORAL
  Filled 2017-01-16 (×4): qty 1

## 2017-01-16 MED ORDER — INSULIN ASPART 100 UNIT/ML ~~LOC~~ SOLN: 0.0000 [IU] | Freq: Every day | SUBCUTANEOUS | Status: DC

## 2017-01-16 MED ORDER — INSULIN ASPART 100 UNIT/ML ~~LOC~~ SOLN
10.0000 [IU] | Freq: Once | SUBCUTANEOUS | Status: AC
Start: 2017-01-16 — End: 2017-01-16
  Administered 2017-01-16: 10 [IU] via INTRAVENOUS
  Filled 2017-01-16: qty 1

## 2017-01-16 MED ORDER — ONDANSETRON HCL 4 MG/2ML IJ SOLN: 4.0000 mg | Freq: Four times a day (QID) | INTRAMUSCULAR | Status: DC | PRN

## 2017-01-16 MED ORDER — ACETAMINOPHEN 650 MG RE SUPP: 650.0000 mg | Freq: Four times a day (QID) | RECTAL | Status: DC | PRN

## 2017-01-16 MED ORDER — INSULIN ASPART 100 UNIT/ML ~~LOC~~ SOLN: 14.0000 [IU] | Freq: Once | SUBCUTANEOUS | Status: DC

## 2017-01-16 MED ORDER — INSULIN ASPART 100 UNIT/ML ~~LOC~~ SOLN
0.0000 [IU] | Freq: Three times a day (TID) | SUBCUTANEOUS | Status: DC
  Administered 2017-01-17: 8 [IU] via SUBCUTANEOUS
  Administered 2017-01-17: 5 [IU] via SUBCUTANEOUS
  Administered 2017-01-17 – 2017-01-19 (×5): 3 [IU] via SUBCUTANEOUS
  Administered 2017-01-19: 5 [IU] via SUBCUTANEOUS
  Administered 2017-01-19: 3 [IU] via SUBCUTANEOUS
  Administered 2017-01-20: 5 [IU] via SUBCUTANEOUS
  Administered 2017-01-20: 8 [IU] via SUBCUTANEOUS

## 2017-01-16 MED ORDER — INSULIN ASPART 100 UNIT/ML ~~LOC~~ SOLN
0.0000 [IU] | Freq: Three times a day (TID) | SUBCUTANEOUS | Status: DC
Start: 2017-01-17 — End: 2017-01-16

## 2017-01-16 NOTE — H&P (Signed)
[name] [name] NWG:[id number] DOB: [birth year] DOA: 01/16/2017     PCP: No PCP Per Patient   Outpatient Specialists: none  Patient coming from:  home Lives states with daughter,       Chief Complaint: Found unresponsive  HPI: [name] [name] is a 71 y.o. male with medical history significant of prior CVA, multiple admissions to the hospital for DKA, diabetes mellitus, hypertension and anemia, narcotic (heroin)abuse    Presented with today patient was found unresponsive by his daughter his CBG was 300 when she checked it so she gave a oral glucose and orange juice and EMS  was called and was given Narcan 2 mg intranasal and became responsive EMS rechecked CBG was 468 his blood pressure was 102/67 satting 97% on 2 L. Patient was initially brought to emergency  department he was given another 2 mg of IV Narcan and  left rapidly AMA. While in emergency department patient did have his labs drawn and UA which was suspicious for urinary tract infections his labs were hemolyzed and his daughter was called to attempt to bring him back to have his labs redone.  Blood sugar came back as 493 potassium elevated 5.6 bicarbonate 20 plan was to admit patient for dehydration and to monitor blood sugar    Regarding pertinent Chronic problems: HX of DM 2 with noncompliance with insulin his CBG in the past has been as high as 1600   IN ER:  Temp (24hrs), Avg:98.4 F (36.9 C), Min:98 F (36.7 C), Max:98.7 F (37.1 C)    RR 18 95% 2L Hr 79 Bp 137/70 Na 134 K 5.6 bicarb 20 Cr 1.91 up from baseline of 1.00 Anion gap 12 Following Medications were ordered in ER: Medications  cephALEXin (KEFLEX) capsule 500 mg (500 mg Oral Not Given 01/16/17 1703)  sodium chloride 0.9 % bolus 1,000 mL (not administered)  insulin aspart (novoLOG) injection 10 Units (10 Units Intravenous Given 01/16/17 1817)  sodium chloride 0.9 % bolus 1,000 mL (1,000 mLs Intravenous New Bag/Given 01/16/17 1810)      Hospitalist was called for admission  for UTI, AKI and Hyperglycemia  Review of Systems:    Pertinent positives include: somnolence  Constitutional:  No weight loss, night sweats, Fevers, chills, fatigue, weight loss  HEENT:  No headaches, Difficulty swallowing,Tooth/dental problems,Sore throat,  No sneezing, itching, ear ache, nasal congestion, post nasal drip,  Cardio-vascular:  No chest pain, Orthopnea, PND, anasarca, dizziness, palpitations.no Bilateral lower extremity swelling  GI:  No heartburn, indigestion, abdominal pain, nausea, vomiting, diarrhea, change in bowel habits, loss of appetite, melena, blood in stool, hematemesis Resp:  no shortness of breath at rest. No dyspnea on exertion, No excess mucus, no productive cough, No non-productive cough, No coughing up of blood.No change in color of mucus.No wheezing. Skin:  no rash or lesions. No jaundice GU:  no dysuria, change in color of urine, no urgency or frequency. No straining to urinate.  No flank pain.  Musculoskeletal:  No joint pain or no joint swelling. No decreased range of motion. No back pain.  Psych:  No change in mood or affect. No depression or anxiety. No memory loss.  Neuro: no localizing neurological complaints, no tingling, no weakness, no double vision, no gait abnormality, no slurred speech, no confusion  As per HPI otherwise 10 point review of systems negative.   Past Medical History: Past Medical History:  Diagnosis Date  . 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  Closed comminuted intertrochanteric fracture of left femur ([place])   . Diabetes mellitus without complication ([place])   . Diabetic hyperosmolar non-ketotic state ([place]) 05/15/2016  . DKA (diabetic ketoacidoses) ([place]) 05/09/2016  . Hypertension   . Postoperative anemia due to acute blood loss 06/18/2016  . Retroperitoneal hematoma 06/18/2016  . Stroke ([place])   . Vitamin B 12 deficiency 06/18/2016   Past Surgical History:  Procedure  Laterality Date  . HIP ARTHROPLASTY Right 02/05/2013   Procedure: ARTHROPLASTY BIPOLAR HIP;  Surgeon: [name], MD;  Location: [facility];  Service: Orthopedics;  Laterality: Right;  . INTRAMEDULLARY (IM) NAIL INTERTROCHANTERIC Left 06/16/2016   Procedure: INTRAMEDULLARY (IM) NAIL INTERTROCHANTRIC;  Surgeon: Rod Can, MD;  Location: [facility];  Service: Orthopedics;  Laterality: Left;     Social History:  Ambulatory   wheelchair bound,     reports that he quit smoking about 3 years ago. His smoking use included Cigarettes. He has a 7.50 pack-year smoking history. He has never used smokeless tobacco. He reports that he does not drink alcohol or use drugs.  Allergies:  No Known Allergies     Family History:   Family History  Problem Relation Age of Onset  . Diabetes Mother   . Alzheimer's disease Mother   . Hypertension Mother   . Hyperlipidemia Father   . Hypertension Father   . Healthy Maternal Grandmother   . Pneumonia Maternal Grandfather     Medications: Prior to Admission medications   Medication Sig Start Date End Date Taking? Authorizing Provider  amLODipine (NORVASC) 10 MG tablet Take 1 tablet (10 mg total) by mouth daily. 08/14/15  Yes Robbie Lis, MD  aspirin EC 81 MG EC tablet Take 1 tablet (81 mg total) by mouth daily. 11/20/16  Yes Shanker Kristeen Mans, MD  carvedilol (COREG) 6.25 MG tablet Take 1 tablet (6.25 mg total) by mouth 2 (two) times daily with a meal. 05/12/16  Yes Thurnell Lose, MD  FLUoxetine (PROZAC) 20 MG capsule Take 20 mg by mouth daily.   Yes Historical Provider, MD  insulin aspart (NOVOLOG) 100 UNIT/ML injection Inject 0-15 Units into the skin See admin instructions. 0-15 Units, Subcutaneous, 3 times daily with meals CBG < 70: implement hypoglycemia protocol CBG 70 - 120: 0 units CBG 121 - 150: 2 units CBG 151 - 200: 3 units CBG 201 - 250: 5 units CBG 251 - 300: 8 units CBG 301 - 350: 11 units CBG 351 - 400: 15 units CBG > 400: call MD  12/30/16  Yes Orson Eva, MD  insulin glargine (LANTUS) 100 UNIT/ML injection Inject 0.1 mLs (10 Units total) into the skin daily. Patient taking differently: Inject 10 Units into the skin at bedtime.  12/31/16  Yes Orson Eva, MD  cephALEXin (KEFLEX) 500 MG capsule Take 1 capsule (500 mg total) by mouth 3 (three) times daily. 01/16/17 01/26/17  Leo Grosser, MD  cyanocobalamin (,VITAMIN B-12,) 1000 MCG/ML injection Inject 1 mL (1,000 mcg total) into the muscle every 30 (thirty) days. Patient not taking: Reported on 11/16/2016 06/20/16   Venetia Maxon Rama, MD    Physical Exam: Patient Vitals for the past 24 hrs:  BP Temp Temp src Pulse Resp SpO2  01/16/17 1818 137/70 - - 79 18 95 %  01/16/17 1816 - - - - - (!) 88 %  01/16/17 1545 113/67 98.7 F (37.1 C) Oral 78 - 94 %    1. General:  in No Acute distress 2. Psychological: Alert and  Oriented 3. Head/ENT:   Dry Mucous Membranes                          Head Non traumatic, neck supple                            Poor Dentition 4. SKIN:   decreased Skin turgor,  Skin clean Dry and intact no rash 5. Heart: Regular rate and rhythm no Murmur, Rub or gallop 6. Lungs:  Clear to auscultation bilaterally, no wheezes or crackles   7. Abdomen: Soft,  non-tender, Non distended 8. Lower extremities: no clubbing, cyanosis, or edema 9. Neurologically Grossly intact, moving all 4 extremities equally   10. MSK: Normal range of motion   body mass index is unknown because there is no height or weight on file.  Labs on Admission:   Labs on Admission: I have personally reviewed following labs and imaging studies  CBC:  Recent Labs Lab 01/16/17 1335  WBC 11.9*  NEUTROABS 10.3*  HGB 9.7*  HCT 29.6*  MCV 95.5  PLT 710   Basic Metabolic Panel:  Recent Labs Lab 01/16/17 1745  NA 134*  K 5.6*  CL 102  CO2 20*  GLUCOSE 493*  BUN 44*  CREATININE 1.91*  CALCIUM 9.1   GFR: CrCl cannot be calculated (Unknown ideal weight.). Liver Function  Tests: No results for input(s): AST, ALT, ALKPHOS, BILITOT, PROT, ALBUMIN in the last 168 hours. No results for input(s): LIPASE, AMYLASE in the last 168 hours. No results for input(s): AMMONIA in the last 168 hours. Coagulation Profile: No results for input(s): INR, PROTIME in the last 168 hours. Cardiac Enzymes: No results for input(s): CKTOTAL, CKMB, CKMBINDEX, TROPONINI in the last 168 hours. BNP (last 3 results) No results for input(s): PROBNP in the last 8760 hours. HbA1C: No results for input(s): HGBA1C in the last 72 hours. CBG:  Recent Labs Lab 01/16/17 1221 01/16/17 1426 01/16/17 1712  GLUCAP 404* 557* 423*   Lipid Profile: No results for input(s): CHOL, HDL, LDLCALC, TRIG, CHOLHDL, LDLDIRECT in the last 72 hours. Thyroid Function Tests: No results for input(s): TSH, T4TOTAL, FREET4, T3FREE, THYROIDAB in the last 72 hours. Anemia Panel: No results for input(s): VITAMINB12, FOLATE, FERRITIN, TIBC, IRON, RETICCTPCT in the last 72 hours. Urine analysis:    Component Value Date/Time   COLORURINE YELLOW 01/16/2017 1443   APPEARANCEUR HAZY (A) 01/16/2017 1443   LABSPEC 1.014 01/16/2017 1443   PHURINE 5.0 01/16/2017 1443   GLUCOSEU >=500 (A) 01/16/2017 1443   HGBUR SMALL (A) 01/16/2017 1443   BILIRUBINUR NEGATIVE 01/16/2017 1443   KETONESUR 5 (A) 01/16/2017 1443   PROTEINUR 30 (A) 01/16/2017 1443   UROBILINOGEN 0.2 08/21/2015 2154   NITRITE POSITIVE (A) 01/16/2017 1443   LEUKOCYTESUR TRACE (A) 01/16/2017 1443   Sepsis Labs: @LABRCNTIP (procalcitonin:4,lacticidven:4) )No results found for this or any previous visit (from the past 240 hour(s)).     UA  evidence of UTI     Lab Results  Component Value Date   HGBA1C 8.5 (H) 12/29/2016    CrCl cannot be calculated (Unknown ideal weight.).  BNP (last 3 results) No results for input(s): PROBNP in the last 8760 hours.   ECG REPORT  not obtained  There were no vitals filed for this visit.   Cultures:     Component Value Date/Time   SDES BLOOD LEFT THUMB 12/13/2016 2300   SPECREQUEST IN PEDIATRIC  BOTTLE 2CC 12/13/2016 2300   CULT NO GROWTH 5 DAYS 12/13/2016 2300   REPTSTATUS 12/19/2016 FINAL 12/13/2016 2300     Radiological Exams on Admission: No results found.  Chart has been reviewed    Assessment/Plan   71 y.o. male with medical history significant of prior CVA, multiple admissions to the hospital for DKA, diabetes mellitus, hypertension and anemia, narcotic (heroin)abuse admitted with AKI, hyperglycemia, hyperkalemia and UTi  Present on Admission: Hyperglycemia- likely secondary to noncompliance no evidence of DKA at this point. Blood sugars coming down after administration of 10 units IV and IV fluids. Restart home medications, diabetes coordinator consult . CKD (chronic kidney disease) stage 3, GFR 30-59 ml/min creatinine above baseline likely second to dehydration . Narcotic dependency, continuous ([place]) social work consult for ongoing drug abuse interfering with medical management . Hyperkalemia -   insulin administered and IV fluids, obtain   ECG monitor on telemetry administer kayexalate. Marland Kitchen Uncontrolled type 2 diabetes mellitus with complication ([place]) . Acute metabolic encephalopathy -initial unresponsiveness most likely secondary to unintentional drug overdose. U tox positive for heroine although patient denies patient responded well to Narcan multiple scarring noted over the antecubital fossa bilaterally some fresh injection sites . AKI (acute kidney injury) ([place]) I'll rehydrate check urine electrolytes Debility and frailty her PT OT evaluate for safe discharge UA positive for nitrites and white blood cell count to numerous to count patient unable to provide detailed history but does have slightly elevated white blood cell count. For now treat with Rocephin await results of urine culture Other plan as per orders.  DVT prophylaxis:   Lovenox     Code Status:  FULL CODE   Family Communication:   Family not  at  Bedside attempted to call daughter but no answer    Disposition Plan:    To home once workup is complete and patient is stable    Would benefit from PT/OT eval prior to DC   ordered                     Social Work   Diabetes coordinator   Nutrition     consulted                          Consults called: none   Admission status: obs   Level of care     tele          I have spent a total of 56 min on this admission     Kaelin Holford 01/16/2017, 8:45 PM    [facility]  Pager [phone]   after 2 AM please page floor coverage PA If 7AM-7PM, please contact the day team taking care of the patient  [email or web address]  Password [email or web address]

## 2017-01-16 NOTE — ED Notes (Signed)
Jake RN at bedside with ultrasound trying to obtain IV access

## 2017-01-16 NOTE — ED Notes (Signed)
Daughter has patient in wheelchair, taking him home per his request.  Daughter signed AMA for patient.

## 2017-01-16 NOTE — ED Notes (Signed)
Made Dr Laneta Simmers aware that patient left

## 2017-01-16 NOTE — ED Triage Notes (Signed)
Per [facility], patient from home.  Patient's daughter went out to his house and found him unresponsive.  She saw that his CBG 300s, so she gave him oral glucose and orange juice.  Patient was given Narcan 2mg  intranasal due to [place] drug abuse. Patient become responsive. Last CBG EMS got was 468.  Vitals: 102/62 97% 2L O2.

## 2017-01-16 NOTE — ED Notes (Signed)
Bed: ZG01 Expected date:  Expected time:  Means of arrival:  Comments: Tri 2

## 2017-01-16 NOTE — ED Provider Notes (Signed)
Signed out to check labs when back.  Patient with hyperglycemia, glucose 493, k high, hco3 20.  Iv ns boluses. novolog sq.   Given aki, hyperglycemia, high k, will admit.      Lajean Saver, MD 01/16/17 [phone]

## 2017-01-16 NOTE — ED Provider Notes (Signed)
[facility] DEPT Provider Note   CSN: [id number] Arrival date & time: 01/16/17  1542     History   Chief Complaint Chief Complaint  Patient presents with  . Urinary Tract Infection    HPI [name] [name] is a 71 y.o. male.  The history is provided by the patient and a relative.  Altered Mental Status   This is a new problem. The current episode started 6 to 12 hours ago. The problem has been resolved. Associated symptoms include somnolence and unresponsiveness. Risk factors include illicit drug use. His past medical history is significant for diabetes.    Past Medical History:  Diagnosis Date  . 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 Closed comminuted intertrochanteric fracture of left femur ([place])   . Diabetes mellitus without complication ([place])   . Diabetic hyperosmolar non-ketotic state ([place]) 05/15/2016  . DKA (diabetic ketoacidoses) ([place]) 05/09/2016  . Hypertension   . Postoperative anemia due to acute blood loss 06/18/2016  . Retroperitoneal hematoma 06/18/2016  . Stroke ([place])   . Vitamin B 12 deficiency 06/18/2016    Patient Active Problem List   Diagnosis Date Noted  . Acute metabolic encephalopathy 92/42/6834  . Elevated lactic acid level   . DKA (diabetic ketoacidoses) ([place]) 12/28/2016  . AKI (acute kidney injury) ([place]) 12/14/2016  . Hypoglycemia   . Sepsis ([place]) 12/13/2016  . Uncontrolled type 2 diabetes mellitus with complication ([place])   . Encephalopathy, metabolic   . Acute renal failure superimposed on stage 3 chronic kidney disease ([place])   . Noncompliance with medication regimen 08/17/2016  . History of DVT (deep vein thrombosis) 07/06/2016  . Retroperitoneal hematoma 06/18/2016  . Vitamin B 12 deficiency 06/18/2016  . Anxiety 05/15/2016  . History of CVA (cerebrovascular accident) 05/15/2016  . Hypoglycemia, coma ([place]) 11/02/2015  . Narcotic dependency, continuous ([place]) 11/02/2015  . Uncontrolled type 2  diabetes mellitus with diabetic nephropathy, with long-term current use of insulin ([place]) 08/12/2015  . Depression 08/12/2015  . CKD (chronic kidney disease) stage 3, GFR 30-59 ml/min 03/20/2014    Past Surgical History:  Procedure Laterality Date  . HIP ARTHROPLASTY Right 02/05/2013   Procedure: ARTHROPLASTY BIPOLAR HIP;  Surgeon: [name], MD;  Location: [facility];  Service: Orthopedics;  Laterality: Right;  . INTRAMEDULLARY (IM) NAIL INTERTROCHANTERIC Left 06/16/2016   Procedure: INTRAMEDULLARY (IM) NAIL INTERTROCHANTRIC;  Surgeon: Rod Can, MD;  Location: [facility];  Service: Orthopedics;  Laterality: Left;       Home Medications    Prior to Admission medications   Medication Sig Start Date End Date Taking? Authorizing Provider  amLODipine (NORVASC) 10 MG tablet Take 1 tablet (10 mg total) by mouth daily. 08/14/15   Robbie Lis, MD  aspirin EC 81 MG EC tablet Take 1 tablet (81 mg total) by mouth daily. 11/20/16   Shanker Kristeen Mans, MD  carvedilol (COREG) 6.25 MG tablet Take 1 tablet (6.25 mg total) by mouth 2 (two) times daily with a meal. 05/12/16   Thurnell Lose, MD  cephALEXin (KEFLEX) 500 MG capsule Take 1 capsule (500 mg total) by mouth 3 (three) times daily. 01/16/17 01/26/17  Leo Grosser, MD  cyanocobalamin (,VITAMIN B-12,) 1000 MCG/ML injection Inject 1 mL (1,000 mcg total) into the muscle every 30 (thirty) days. Patient not taking: Reported on 11/16/2016 06/20/16   Venetia Maxon Rama, MD  FLUoxetine (PROZAC) 20 MG capsule Take 20 mg by mouth daily.    Historical Provider, MD  insulin aspart (NOVOLOG) 100 UNIT/ML injection Inject 0-15 Units into the skin See admin instructions. 0-15 Units, Subcutaneous, 3 times daily with meals CBG < 70: implement hypoglycemia protocol CBG 70 - 120: 0 units CBG 121 - 150: 2 units CBG 151 - 200: 3 units CBG 201 - 250: 5 units CBG 251 - 300: 8 units CBG 301 - 350: 11 units CBG 351 - 400: 15 units CBG > 400: call MD 12/30/16   Orson Eva, MD    insulin glargine (LANTUS) 100 UNIT/ML injection Inject 0.1 mLs (10 Units total) into the skin daily. Patient taking differently: Inject 10 Units into the skin at bedtime.  12/31/16   Orson Eva, MD    Family History Family History  Problem Relation Age of Onset  . Diabetes Mother   . Alzheimer's disease Mother   . Hypertension Mother   . Hyperlipidemia Father   . Hypertension Father   . Healthy Maternal Grandmother   . Pneumonia Maternal Grandfather     Social History Social History  Substance Use Topics  . Smoking status: Former Smoker    Packs/day: 0.25    Years: 30.00    Types: Cigarettes    Quit date: 01/31/2013  . Smokeless tobacco: Never Used  . Alcohol use No     Allergies   Patient has no known allergies.   Review of Systems Review of Systems  All other systems reviewed and are negative.    Physical Exam Updated Vital Signs BP 113/67 (BP Location: Right Arm)   Pulse 78   Temp 98.7 F (37.1 C) (Oral)   SpO2 94%   Physical Exam  Constitutional: He is oriented to person, place, and time. He appears well-developed and well-nourished. No distress.  HENT:  Head: Normocephalic and atraumatic.  Nose: Nose normal.  Eyes: Conjunctivae are normal.  Neck: Neck supple. No tracheal deviation present.  Cardiovascular: Normal rate and regular rhythm.   Pulmonary/Chest: Effort normal. No respiratory distress.  Abdominal: Soft. He exhibits no distension. There is no tenderness.  Neurological: He is alert and oriented to person, place, and time. No cranial nerve deficit. Coordination normal.  Skin: Skin is warm and dry.  Psychiatric: He has a normal mood and affect.     ED Treatments / Results  Labs (all labs ordered are listed, but only abnormal results are displayed) Labs Reviewed  BASIC METABOLIC PANEL    EKG  EKG Interpretation None       Radiology No results found.  Procedures Procedures (including critical care time)  Medications Ordered in  ED Medications  cephALEXin (KEFLEX) capsule 500 mg (500 mg Oral Not Given 01/16/17 1703)     Initial Impression / Assessment and Plan / ED Course  I have reviewed the triage vital signs and the nursing notes.  Pertinent labs & imaging results that were available during my care of the patient were reviewed by me and considered in my medical decision making (see chart for details).     71 year old male patient returns for second visit due to altered mental status episode earlier today and it was responsive to Narcan. His mental status is improved but I asked him to return for BMP recheck given his severe hyperglycemia, history of DKA previously, and urinary tract infection which is evident on UA. BMP ordered, Keflex ordered for here and prescription written for home. Care transferred to Dr. Ashok Cordia at 5 PM pending completed lab evaluation with plan to discharge acidosis or other serious complicating features are present.  Final Clinical Impressions(s) / ED Diagnoses   Final diagnoses:  Urinary tract infection without hematuria, site unspecified    New Prescriptions New Prescriptions   CEPHALEXIN (KEFLEX) 500 MG CAPSULE    Take 1 capsule (500 mg total) by mouth 3 (three) times daily.     Leo Grosser, MD 01/16/17 [phone]

## 2017-01-16 NOTE — ED Provider Notes (Signed)
[facility] DEPT Provider Note   CSN: [id number] Arrival date & time: 01/16/17  1207     History   Chief Complaint Chief Complaint  Patient presents with  . unresponsive    HPI [name] [name] is a 71 y.o. male.  The history is provided by a relative.  Altered Mental Status   This is a new problem. The current episode started 6 to 12 hours ago. The problem has been rapidly improving (After Narcan). Associated symptoms include somnolence and unresponsiveness. Risk factors include illicit drug use. His past medical history is significant for diabetes.    Past Medical History:  Diagnosis Date  . 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) 06/08/2015  . Closed comminuted intertrochanteric fracture of left femur ([place])   . Diabetes mellitus without complication ([place])   . Diabetic hyperosmolar non-ketotic state ([place]) 05/15/2016  . DKA (diabetic ketoacidoses) ([place]) 05/09/2016  . Hypertension   . Postoperative anemia due to acute blood loss 06/18/2016  . Retroperitoneal hematoma 06/18/2016  . Stroke ([place])   . Vitamin B 12 deficiency 06/18/2016    Patient Active Problem List   Diagnosis Date Noted  . Acute metabolic encephalopathy 04/11/1600  . Elevated lactic acid level   . DKA (diabetic ketoacidoses) ([place]) 12/28/2016  . AKI (acute kidney injury) ([place]) 12/14/2016  . Hypoglycemia   . Sepsis ([place]) 12/13/2016  . Uncontrolled type 2 diabetes mellitus with complication ([place])   . Encephalopathy, metabolic   . Acute renal failure superimposed on stage 3 chronic kidney disease ([place])   . Noncompliance with medication regimen 08/17/2016  . History of DVT (deep vein thrombosis) 07/06/2016  . Retroperitoneal hematoma 06/18/2016  . Vitamin B 12 deficiency 06/18/2016  . Anxiety 05/15/2016  . History of CVA (cerebrovascular accident) 05/15/2016  . Hypoglycemia, coma ([place]) 11/02/2015  . Narcotic dependency, continuous ([place]) 11/02/2015  . Uncontrolled type 2  diabetes mellitus with diabetic nephropathy, with long-term current use of insulin ([place]) 08/12/2015  . Depression 08/12/2015  . CKD (chronic kidney disease) stage 3, GFR 30-59 ml/min 03/20/2014    Past Surgical History:  Procedure Laterality Date  . HIP ARTHROPLASTY Right 02/05/2013   Procedure: ARTHROPLASTY BIPOLAR HIP;  Surgeon: [name], MD;  Location: [facility];  Service: Orthopedics;  Laterality: Right;  . INTRAMEDULLARY (IM) NAIL INTERTROCHANTERIC Left 06/16/2016   Procedure: INTRAMEDULLARY (IM) NAIL INTERTROCHANTRIC;  Surgeon: Rod Can, MD;  Location: [facility];  Service: Orthopedics;  Laterality: Left;       Home Medications    Prior to Admission medications   Medication Sig Start Date End Date Taking? Authorizing Provider  amLODipine (NORVASC) 10 MG tablet Take 1 tablet (10 mg total) by mouth daily. 08/14/15  Yes Robbie Lis, MD  aspirin EC 81 MG EC tablet Take 1 tablet (81 mg total) by mouth daily. 11/20/16  Yes Shanker Kristeen Mans, MD  carvedilol (COREG) 6.25 MG tablet Take 1 tablet (6.25 mg total) by mouth 2 (two) times daily with a meal. 05/12/16  Yes Thurnell Lose, MD  FLUoxetine (PROZAC) 20 MG capsule Take 20 mg by mouth daily.   Yes Historical Provider, MD  insulin aspart (NOVOLOG) 100 UNIT/ML injection Inject 0-15 Units into the skin See admin instructions. 0-15 Units, Subcutaneous, 3 times daily with meals CBG < 70: implement hypoglycemia protocol CBG 70 - 120: 0 units CBG 121 - 150: 2 units CBG 151 - 200: 3 units CBG 201 - 250: 5 units CBG 251 - 300: 8 units  CBG 301 - 350: 11 units CBG 351 - 400: 15 units CBG > 400: call MD 12/30/16  Yes Orson Eva, MD  insulin glargine (LANTUS) 100 UNIT/ML injection Inject 0.1 mLs (10 Units total) into the skin daily. Patient taking differently: Inject 10 Units into the skin at bedtime.  12/31/16  Yes Orson Eva, MD  cyanocobalamin (,VITAMIN B-12,) 1000 MCG/ML injection Inject 1 mL (1,000 mcg total) into the muscle every 30  (thirty) days. Patient not taking: Reported on 11/16/2016 06/20/16   Venetia Maxon Rama, MD    Family History Family History  Problem Relation Age of Onset  . Diabetes Mother   . Alzheimer's disease Mother   . Hypertension Mother   . Hyperlipidemia Father   . Hypertension Father   . Healthy Maternal Grandmother   . Pneumonia Maternal Grandfather     Social History Social History  Substance Use Topics  . Smoking status: Former Smoker    Packs/day: 0.25    Years: 30.00    Types: Cigarettes    Quit date: 01/31/2013  . Smokeless tobacco: Never Used  . Alcohol use No     Allergies   Patient has no known allergies.   Review of Systems Review of Systems  All other systems reviewed and are negative.    Physical Exam Updated Vital Signs BP 118/71   Pulse 61   Temp 98 F (36.7 C) (Oral)   Resp 11   SpO2 96%   Physical Exam  Constitutional: He is oriented to person, place, and time. He appears well-developed and well-nourished. He appears lethargic. No distress.  Sleeping intermittently while talking, somnolent, improves with Narcan and patient ambulatory without difficulty  HENT:  Head: Normocephalic and atraumatic.  Nose: Nose normal.  Eyes: Conjunctivae are normal.  Neck: Neck supple. No tracheal deviation present.  Cardiovascular: Normal rate, regular rhythm and normal heart sounds.   Pulmonary/Chest: Effort normal and breath sounds normal. No respiratory distress.  Abdominal: Soft. He exhibits no distension. There is no tenderness. There is no rebound and no guarding.  Neurological: He is oriented to person, place, and time. He appears lethargic.  Skin: Skin is warm and dry.  Psychiatric: He has a normal mood and affect.     ED Treatments / Results  Labs (all labs ordered are listed, but only abnormal results are displayed) Labs Reviewed  CBC WITH DIFFERENTIAL/PLATELET - Abnormal; Notable for the following:       Result Value   WBC 11.9 (*)    RBC 3.10 (*)      Hemoglobin 9.7 (*)    HCT 29.6 (*)    Neutro Abs 10.3 (*)    All other components within normal limits  RAPID URINE DRUG SCREEN, HOSP PERFORMED - Abnormal; Notable for the following:    Opiates POSITIVE (*)    All other components within normal limits  URINALYSIS, ROUTINE W REFLEX MICROSCOPIC - Abnormal; Notable for the following:    APPearance HAZY (*)    Glucose, UA >=500 (*)    Hgb urine dipstick SMALL (*)    Ketones, ur 5 (*)    Protein, ur 30 (*)    Nitrite POSITIVE (*)    Leukocytes, UA TRACE (*)    Bacteria, UA RARE (*)    Squamous Epithelial / LPF 0-5 (*)    All other components within normal limits  CBG MONITORING, ED - Abnormal; Notable for the following:    Glucose-Capillary 404 (*)    All other components within normal  limits  CBG MONITORING, ED - Abnormal; Notable for the following:    Glucose-Capillary 557 (*)    All other components within normal limits  URINE CULTURE    EKG  EKG Interpretation None       Radiology No results found.  Procedures Procedures (including critical care time)  Medications Ordered in ED Medications  sodium chloride 0.9 % bolus 1,000 mL (0 mLs Intravenous Stopped 01/16/17 1501)  insulin aspart (novoLOG) injection 10 Units (10 Units Intravenous Given 01/16/17 1442)  naloxone (NARCAN) injection 2 mg (2 mg Intravenous Given 01/16/17 1443)     Initial Impression / Assessment and Plan / ED Course  I have reviewed the triage vital signs and the nursing notes.  Pertinent labs & imaging results that were available during my care of the patient were reviewed by me and considered in my medical decision making (see chart for details).     71 year old male with history of heroin abuse presents with unresponsive episode where his daughter fed him breakfast this morning and then when she came back to find him he was unable to be aroused. She called 911 who administered Narcan and he woke up was able to be transported to the emergency  department and has partially resolved.  He did require some oxygen briefly and was very somnolent so was provided another dose of 2 mg of IV Narcan and fully resolved his altered mental status.  The patient was profoundly hyperglycemic, labs were sent for screening purposes and BMP was hemolyzed with potassium reading greater than 8 but normal anion gap. While pending these values the patient eloped from the emergency department. I was able to call his daughter to communicate that he likely had urinary tract infection and required antibiotics, needed further stabilization of his glucose and needed a redraw of his BMP to ensure that he did not have hyperkalemia or another life threatening illness. They agreed to return. See related visit documentation.  Final Clinical Impressions(s) / ED Diagnoses   Final diagnoses:  Episode of unresponsiveness  Hyperglycemia without ketosis  Substance abuse  Left against medical advice  Urinary tract infection without hematuria, site unspecified    New Prescriptions Discharge Medication List as of 01/16/2017  3:11 PM       Leo Grosser, MD 01/16/17 1706

## 2017-01-16 NOTE — ED Notes (Signed)
Bed: JZ53 Expected date:  Expected time:  Means of arrival:  Comments: EMS fall, given narcan

## 2017-01-16 NOTE — ED Notes (Signed)
ED Provider at bedside. 

## 2017-01-16 NOTE — ED Triage Notes (Signed)
Pt left AMA earlier today, pt called back for abnormal labs and UTI.

## 2017-01-16 NOTE — ED Notes (Signed)
Patient vomited up keflex tablet.  Made Dr Laneta Simmers aware. Was instructed to give patient tablet that he ordered.  Gave patient keflex tablet with applesauce.

## 2017-01-17 DIAGNOSIS — E1121 Type 2 diabetes mellitus with diabetic nephropathy: Secondary | ICD-10-CM | POA: Diagnosis not present

## 2017-01-17 DIAGNOSIS — E875 Hyperkalemia: Secondary | ICD-10-CM | POA: Diagnosis not present

## 2017-01-17 DIAGNOSIS — G9341 Metabolic encephalopathy: Secondary | ICD-10-CM

## 2017-01-17 DIAGNOSIS — E1165 Type 2 diabetes mellitus with hyperglycemia: Secondary | ICD-10-CM | POA: Diagnosis not present

## 2017-01-17 DIAGNOSIS — F1120 Opioid dependence, uncomplicated: Secondary | ICD-10-CM

## 2017-01-17 DIAGNOSIS — N390 Urinary tract infection, site not specified: Secondary | ICD-10-CM | POA: Diagnosis not present

## 2017-01-17 DIAGNOSIS — A419 Sepsis, unspecified organism: Secondary | ICD-10-CM

## 2017-01-17 DIAGNOSIS — Z794 Long term (current) use of insulin: Secondary | ICD-10-CM | POA: Diagnosis not present

## 2017-01-17 DIAGNOSIS — N183 Chronic kidney disease, stage 3 (moderate): Secondary | ICD-10-CM

## 2017-01-17 DIAGNOSIS — N179 Acute kidney failure, unspecified: Secondary | ICD-10-CM | POA: Diagnosis not present

## 2017-01-17 DIAGNOSIS — E860 Dehydration: Secondary | ICD-10-CM | POA: Diagnosis not present

## 2017-01-17 DIAGNOSIS — R739 Hyperglycemia, unspecified: Secondary | ICD-10-CM | POA: Diagnosis not present

## 2017-01-17 LAB — LACTIC ACID, PLASMA: Lactic Acid, Venous: 3.9 mmol/L (ref 0.5–1.9)

## 2017-01-17 LAB — GLUCOSE, CAPILLARY
Glucose-Capillary: 165 mg/dL — ABNORMAL HIGH (ref 65–99)
Glucose-Capillary: 200 mg/dL — ABNORMAL HIGH (ref 65–99)
Glucose-Capillary: 207 mg/dL — ABNORMAL HIGH (ref 65–99)
Glucose-Capillary: 210 mg/dL — ABNORMAL HIGH (ref 65–99)
Glucose-Capillary: 224 mg/dL — ABNORMAL HIGH (ref 65–99)
Glucose-Capillary: 252 mg/dL — ABNORMAL HIGH (ref 65–99)
Glucose-Capillary: 449 mg/dL — ABNORMAL HIGH (ref 65–99)

## 2017-01-17 LAB — FERRITIN: Ferritin: 378 ng/mL — ABNORMAL HIGH (ref 24–336)

## 2017-01-17 LAB — IRON AND TIBC
Iron: 15 ug/dL — ABNORMAL LOW (ref 45–182)
Saturation Ratios: 6 % — ABNORMAL LOW (ref 17.9–39.5)
TIBC: 267 ug/dL (ref 250–450)
UIBC: 252 ug/dL

## 2017-01-17 LAB — RETICULOCYTES
RBC.: 3.49 MIL/uL — ABNORMAL LOW (ref 4.22–5.81)
Retic Count, Absolute: 48.9 10*3/uL (ref 19.0–186.0)
Retic Ct Pct: 1.4 % (ref 0.4–3.1)

## 2017-01-17 LAB — COMPREHENSIVE METABOLIC PANEL
ALT: 11 U/L — ABNORMAL LOW (ref 17–63)
AST: 17 U/L (ref 15–41)
Albumin: 3.1 g/dL — ABNORMAL LOW (ref 3.5–5.0)
Alkaline Phosphatase: 139 U/L — ABNORMAL HIGH (ref 38–126)
Anion gap: 11 (ref 5–15)
BUN: 32 mg/dL — ABNORMAL HIGH (ref 6–20)
CO2: 20 mmol/L — ABNORMAL LOW (ref 22–32)
Calcium: 8.7 mg/dL — ABNORMAL LOW (ref 8.9–10.3)
Chloride: 109 mmol/L (ref 101–111)
Creatinine, Ser: 1.44 mg/dL — ABNORMAL HIGH (ref 0.61–1.24)
GFR calc Af Amer: 55 mL/min — ABNORMAL LOW (ref >60)
GFR calc non Af Amer: 47 mL/min — ABNORMAL LOW (ref >60)
Glucose, Bld: 240 mg/dL — ABNORMAL HIGH (ref 65–99)
Potassium: 5 mmol/L (ref 3.5–5.1)
Sodium: 140 mmol/L (ref 135–145)
Total Bilirubin: 0.7 mg/dL (ref 0.3–1.2)
Total Protein: 7.3 g/dL (ref 6.5–8.1)

## 2017-01-17 LAB — CBC
HCT: 32.8 % — ABNORMAL LOW (ref 39.0–52.0)
Hemoglobin: 10.8 g/dL — ABNORMAL LOW (ref 13.0–17.0)
MCH: 30.9 pg (ref 26.0–34.0)
MCHC: 32.9 g/dL (ref 30.0–36.0)
MCV: 94 fL (ref 78.0–100.0)
Platelets: 283 10*3/uL (ref 150–400)
RBC: 3.49 MIL/uL — ABNORMAL LOW (ref 4.22–5.81)
RDW: 14.2 % (ref 11.5–15.5)
WBC: 19.1 10*3/uL — ABNORMAL HIGH (ref 4.0–10.5)

## 2017-01-17 LAB — MRSA PCR SCREENING: MRSA by PCR: NEGATIVE

## 2017-01-17 LAB — PHOSPHORUS: Phosphorus: 3.4 mg/dL (ref 2.5–4.6)

## 2017-01-17 LAB — PROTIME-INR
INR: 1.02
Prothrombin Time: 13.5 seconds (ref 11.4–15.2)

## 2017-01-17 LAB — MAGNESIUM: Magnesium: 2.4 mg/dL (ref 1.7–2.4)

## 2017-01-17 LAB — VITAMIN B12: Vitamin B-12: 481 pg/mL (ref 180–914)

## 2017-01-17 LAB — TSH: TSH: 0.395 u[IU]/mL (ref 0.350–4.500)

## 2017-01-17 MED ORDER — SODIUM CHLORIDE 0.9 % IV SOLN
INTRAVENOUS | Status: AC
Start: 2017-01-17 — End: 2017-01-19
  Administered 2017-01-17 – 2017-01-18 (×3): via INTRAVENOUS

## 2017-01-17 MED ORDER — INSULIN GLARGINE 100 UNIT/ML ~~LOC~~ SOLN
20.0000 [IU] | Freq: Every day | SUBCUTANEOUS | Status: DC
  Administered 2017-01-18 – 2017-01-20 (×3): 20 [IU] via SUBCUTANEOUS
  Filled 2017-01-17 (×4): qty 0.2

## 2017-01-17 MED ORDER — ENOXAPARIN SODIUM 40 MG/0.4ML ~~LOC~~ SOLN
40.0000 mg | Freq: Every day | SUBCUTANEOUS | Status: DC
  Administered 2017-01-17 – 2017-01-19 (×3): 40 mg via SUBCUTANEOUS
  Filled 2017-01-17 (×3): qty 0.4

## 2017-01-17 NOTE — Progress Notes (Signed)
[facility] PROGRESS NOTE  [name] [name] EXH:[id number] DOB: January 14, 1946 DOA: 01/16/2017 PCP: No PCP Per Patient  Interim summary and HPI  71 y.o. male with medical history significant of prior CVA, multiple admissions to the hospital for DKA, hypertension depression and narcotic abuse; who was admitted with acute on chronic renal failure, hyperglycemia, AMS, sepsis due to UTI and hyperkalemia.  Assessment/Plan: 1-acute encephalopathy: metabolic and toxic (due to medications and UTI) -improved and currently oriented X3 -will continue IVF's and minimization of agents that can affect sensorium -will continue treatment for UTI -continue supportive care  2-SEPSIS due to klebsiella UTI -patient with elevated WBC's, AMS, elevated lactic acid and acute on chronic renal failure. -will continue rocephin for now -continue IVF's -follow sensitivity and final results of urine cultures  -Lactic acid still up today 3.9   3-acute on chronic renal failure: stage 3-4 at baseline -due to dehydration and UTI -will continue IVF's and antibiotics therapy -will follow renal function trend  4-uncontrolled diabetes with nephropathy: multiple admissions for hyperglycemia and DKA. -will continue SSI and lantus -modified carb diet ordered  5-HTN -will continue norvasc and coreg  6-hyperkalemia: -received kayexalate  -also insulin therapy and IVF's -K WNL now  7-depression -continue fluoxetine  8-chronic pain syndrome: due to back pain. ongoing narcotic abuse -will no provide prescriptions for opiates -patient needs outpatient pain clinic   9-hx of CVA: with residual speech impairment. -will continue aspirin -PT/OT has seen patient and recommended [facility] services.  Code Status: Full Family Communication: no family at bedside  Disposition Plan: remains in the hospital; when medically stable will arrange for home health services. Continue IVF's and IV antibiotics for his klebsiella UTI; follow  renal function and lactic acid.   Consultants:  None   Procedures:  See below for x-ray reports   Antibiotics:  Rocephin 01/16/17  HPI/Subjective: Patient afebrile, oriented X3; complaining of back pain and feeling weak. Reported not longer using heroin; but taking probably more oxycodone that what was prescribed (of note his medication list doesn't reflect prescription for oxycodone).  Objective: Vitals:   01/17/17 1400 01/17/17 2117  BP: (!) 108/52 112/67  Pulse: 62 70  Resp: 20 20  Temp: 98.1 F (36.7 C) 98.4 F (36.9 C)    Intake/Output Summary (Last 24 hours) at 01/17/17 2143 Last data filed at 01/17/17 1830  Gross per 24 hour  Intake          3576.67 ml  Output              700 ml  Net          2876.67 ml   Filed Weights   01/16/17 2207  Weight: 57.6 kg (127 lb)    Exam:   General: afebrile currently, AAOX3, reports pain in his back; no SOB, no nausea, no vomiting.  Cardiovascular: S1 and S2, no rubs or gallops  Respiratory: good air movement, no wheezing, no crackles  Abdomen: soft, positive BS, no guarding  Musculoskeletal: trace edema bilaterally, no cyanosis   Data Reviewed: Basic Metabolic Panel:  Recent Labs Lab 01/16/17 1745 01/16/17 2001 01/17/17 0553  NA 134* 138 140  K 5.6* 5.6* 5.0  CL 102 104 109  CO2 20* 22 20*  GLUCOSE 493* 386* 240*  BUN 44* 44* 32*  CREATININE 1.91* 1.88* 1.44*  CALCIUM 9.1 9.2 8.7*  MG  --   --  2.4  PHOS  --   --  3.4   Liver Function Tests:  Recent Labs Lab  01/16/17 2001 01/17/17 0553  AST 22 17  ALT 13* 11*  ALKPHOS 159* 139*  BILITOT 0.7 0.7  PROT 8.0 7.3  ALBUMIN 3.5 3.1*   CBC:  Recent Labs Lab 01/16/17 1335 01/17/17 0553  WBC 11.9* 19.1*  NEUTROABS 10.3*  --   HGB 9.7* 10.8*  HCT 29.6* 32.8*  MCV 95.5 94.0  PLT 311 283   CBG:  Recent Labs Lab 01/17/17 0627 01/17/17 0744 01/17/17 1136 01/17/17 1638 01/17/17 2119  GLUCAP 210* 224* 165* 252* 207*    Recent Results  (from the past 240 hour(s))  Urine culture     Status: Abnormal (Preliminary result)   Collection Time: 01/16/17  2:43 PM  Result Value Ref Range Status   Specimen Description URINE, RANDOM  Final   Special Requests NONE  Final   Culture (A)  Final    >=100,000 COLONIES/mL KLEBSIELLA PNEUMONIAE SUSCEPTIBILITIES TO FOLLOW Performed at [facility], [place] [address] [place], [place] [address]    Report Status PENDING  Incomplete  MRSA PCR Screening     Status: None   Collection Time: 01/16/17 11:29 PM  Result Value Ref Range Status   MRSA by PCR NEGATIVE NEGATIVE Final    Comment:        The GeneXpert MRSA Assay (FDA approved for NASAL specimens only), is one component of a comprehensive MRSA colonization surveillance program. It is not intended to diagnose MRSA infection nor to guide or monitor treatment for MRSA infections.      Studies: No results found.  Scheduled Meds: . amLODipine  10 mg Oral Daily  . aspirin EC  81 mg Oral Daily  . carvedilol  6.25 mg Oral BID WC  . cefTRIAXone (ROCEPHIN) IVPB 1 gram/50 mL D5W  1 g Intravenous Q24H  . enoxaparin (LOVENOX) injection  40 mg Subcutaneous QHS  . feeding supplement (ENSURE ENLIVE)  237 mL Oral BID BM  . FLUoxetine  20 mg Oral Daily  . insulin aspart  0-15 Units Subcutaneous TID WC  . insulin aspart  0-5 Units Subcutaneous QHS  . insulin glargine  20 Units Subcutaneous Daily  . sodium chloride flush  3 mL Intravenous Q12H  . thiamine  100 mg Oral Daily   Continuous Infusions: . sodium chloride 125 mL/hr at 01/17/17 2018     Time spent: 25 minutes    Barton Dubois  [facility] Pager [phone]. If 7PM-7AM, please contact night-coverage at [email or web address], password [facility] 01/17/2017, 9:43 PM  LOS: 0 days

## 2017-01-17 NOTE — Evaluation (Signed)
Occupational Therapy Evaluation Patient Details Name: [name] [name] MRN: [id number] DOB: [birth year] Today's Date: 01/17/2017    History of Present Illness 71 y.o. male with medical history significant of prior CVA, multiple admissions to the hospital for DKA, diabetes mellitus, hypertension, polysubstance abuse admitted with AKI, hyperglycemia, hyperkalemia and UTI   Clinical Impression   Pt was admitted for the above.  Pt reports that he was independent at baseline for ADLs and that daughter stays with him.  He currently needs min guard assist for safety.  Goals in acute are for supervision to mod I levels    Follow Up Recommendations  Home health OT;Supervision/Assistance - 24 hour    Equipment Recommendations   (daughter to look into shower seat)    Recommendations for Other Services       Precautions / Restrictions Precautions Precautions: Fall Restrictions Weight Bearing Restrictions: No      Mobility Bed Mobility Overal bed mobility: Needs Assistance Bed Mobility: Supine to Sit     Supine to sit: Supervision;HOB elevated     General bed mobility comments: oob by PT  Transfers Overall transfer level: Needs assistance Equipment used: Rolling walker (2 wheeled) Transfers: Sit to/from Stand Sit to Stand: Min guard         General transfer comment: min/guard for safety, used RW upon sitting so cues for hand placement    Balance                                           ADL either performed or assessed with clinical judgement   ADL Overall ADL's : Needs assistance/impaired     Grooming: Set up;Sitting   Upper Body Bathing: Set up;Sitting   Lower Body Bathing: Min guard;Sit to/from stand   Upper Body Dressing : Set up;Sitting   Lower Body Dressing: Min guard;Sit to/from stand   Toilet Transfer: Min guard;Ambulation;Comfort height toilet;RW   Toileting- Water quality scientist and Hygiene: Min guard;Sit to/from stand          General ADL Comments: pt moves quickly. Ambulated to bathroom at min guard level. Did not want to perform any standing grooming tasks.      Vision         Perception     Praxis      Pertinent Vitals/Pain Pain Assessment: No/denies pain     Hand Dominance Right   Extremity/Trunk Assessment Upper Extremity Assessment Upper Extremity Assessment: Generalized weakness          Communication Communication Communication:  (speech dysarthric)   Cognition Arousal/Alertness: Awake/alert Behavior During Therapy: WFL for tasks assessed/performed (slightly impulsive/moves quickly) Overall Cognitive Status: Within Functional Limits for tasks assessed                                 General Comments: pt appropriate, pleasant and follows commands, states no falls however found down leading to admission, uncertain of PLOF accuracy   General Comments       Exercises     Shoulder Instructions      Home Living Family/patient expects to be discharged to:: Private residence Living Arrangements: Alone Available Help at Discharge: Family;Available PRN/intermittently Type of Home: Apartment Home Access: Level entry     Home Layout: One level     Bathroom Shower/Tub: Occupational psychologist: Standard  Home Equipment: None   Additional Comments: daughter present.  Pt states that she stays with him.  They are interested in a shower seat. Educated that this is an out of pocket expense. Daughter will look into this      Prior Functioning/Environment Level of Independence: Needs assistance  Gait / Transfers Assistance Needed: pt reports ambulating without assistive device ADL's / Homemaking Assistance Needed: daughter checks on pt each morning. pt states he bathes and dresses himself.    Comments: uncertain of above accuracy        OT Problem List: Decreased activity tolerance;Decreased knowledge of use of DME or AE;Decreased safety  awareness;Impaired balance (sitting and/or standing)      OT Treatment/Interventions: Self-care/ADL training;DME and/or AE instruction;Patient/family education;Balance training    OT Goals(Current goals can be found in the care plan section) Acute Rehab OT Goals Patient Stated Goal: home OT Goal Formulation: With patient Time For Goal Achievement: 01/24/17 Potential to Achieve Goals: Good ADL Goals Pt Will Transfer to Toilet: with supervision;ambulating;regular height toilet Pt Will Perform Tub/Shower Transfer: Shower transfer;with min guard assist;ambulating;rolling walker;shower seat Additional ADL Goal #1: pt will gather clothes with RW at supervision level and complete ADL without any cues/assistance  OT Frequency: Min 2X/week   Barriers to D/C:            Co-evaluation              End of Session    Activity Tolerance: Patient tolerated treatment well Patient left: in chair;with call bell/phone within reach;with chair alarm set;with family/visitor present  OT Visit Diagnosis: Unsteadiness on feet (R26.81)                Time: 3976-7341 OT Time Calculation (min): 16 min Charges:  OT General Charges $OT Visit: 1 Procedure OT Evaluation $OT Eval Low Complexity: 1 Procedure G-Codes: OT G-codes **NOT FOR INPATIENT CLASS** Functional Assessment Tool Used: Clinical judgement Functional Limitation: Self care Self Care Current Status (P3790): At least 20 percent but less than 40 percent impaired, limited or restricted Self Care Goal Status (W4097): At least 1 percent but less than 20 percent impaired, limited or restricted   Lesle Chris, OTR/L [phone] 01/17/2017  [name] [name] 01/17/2017, 3:15 PM

## 2017-01-17 NOTE — Plan of Care (Signed)
Problem: Safety: Goal: Ability to remain free from injury will improve Outcome: Completed/Met Date Met: 01/17/17 Bed alarm  Set, pt is aware of safety prevention plan.

## 2017-01-17 NOTE — Progress Notes (Signed)
CRITICAL VALUE ALERT  Critical value received:  LACTIC ACID 3.9 Date of notification:  01/17/17   Time of notification:  6144  Critical value read back:  Nurse who received alert:  Cohen Doleman, RN  MD notified (1st page):  [facility]   Time of first page:  34  MD notified (2nd page):  Time of second page:  Responding MD:  [facility]  Time MD responded:

## 2017-01-17 NOTE — Evaluation (Signed)
Physical Therapy Evaluation Patient Details Name: [name] [name] MRN: [id number] DOB: 1946/06/09 Today's Date: 01/17/2017   History of Present Illness  71 y.o. male with medical history significant of prior CVA, multiple admissions to the hospital for DKA, diabetes mellitus, hypertension, polysubstance abuse admitted with AKI, hyperglycemia, hyperkalemia and UTI  Clinical Impression  Pt admitted with above diagnosis. Pt currently with functional limitations due to the deficits listed below (see PT Problem List).  Pt will benefit from skilled PT to increase their independence and safety with mobility to allow discharge to the venue listed below.  Pt tolerated ambulating in hallway well with RW.  Pt reports he does not have a RW at home.  He also states his daughter checks in on him every morning.     Follow Up Recommendations Home health PT;Supervision - Intermittent    Equipment Recommendations  Rolling walker with 5" wheels    Recommendations for Other Services       Precautions / Restrictions Precautions Precautions: Fall      Mobility  Bed Mobility Overal bed mobility: Needs Assistance Bed Mobility: Supine to Sit     Supine to sit: Supervision;HOB elevated        Transfers Overall transfer level: Needs assistance Equipment used: None Transfers: Sit to/from Stand Sit to Stand: Min guard         General transfer comment: min/guard for safety, used RW upon sitting so cues for hand placement  Ambulation/Gait Ambulation/Gait assistance: Min guard Ambulation Distance (Feet): 120 Feet Assistive device: Rolling walker (2 wheeled) Gait Pattern/deviations: Step-through pattern;Decreased stride length     General Gait Details: pt initially reaching for end of bed and steadying assist with UEs so provided RW, steadiness improved, pt tolerated distance well, SPO2 97% on room air upon returning to room  Stairs            Wheelchair Mobility    Modified Rankin (Stroke  Patients Only)       Balance                                             Pertinent Vitals/Pain Pain Assessment: No/denies pain    Home Living Family/patient expects to be discharged to:: Private residence Living Arrangements: Alone Available Help at Discharge: Family;Available PRN/intermittently Type of Home: Apartment Home Access: Level entry     Home Layout: One level Home Equipment: None      Prior Function Level of Independence: Needs assistance   Gait / Transfers Assistance Needed: pt reports ambulating without assistive device  ADL's / Homemaking Assistance Needed: daughter checks on pt each morning. pt states he bathes and dresses himself.   Comments: uncertain of above accuracy     Hand Dominance        Extremity/Trunk Assessment        Lower Extremity Assessment Lower Extremity Assessment: Generalized weakness       Communication   Communication: Other (comment) (speech dysarthric)  Cognition Arousal/Alertness: Awake/alert Behavior During Therapy: WFL for tasks assessed/performed Overall Cognitive Status: No family/caregiver present to determine baseline cognitive functioning                                 General Comments: pt appropriate, pleasant and follows commands, states no falls however found down leading to admission, uncertain of PLOF accuracy  General Comments      Exercises     Assessment/Plan    PT Assessment Patient needs continued PT services  PT Problem List Decreased strength;Decreased mobility;Decreased knowledge of use of DME;Decreased balance       PT Treatment Interventions DME instruction;Gait training;Therapeutic exercise;Therapeutic activities;Functional mobility training;Patient/family education    PT Goals (Current goals can be found in the Care Plan section)  Acute Rehab PT Goals PT Goal Formulation: With patient Time For Goal Achievement: 01/24/17 Potential to Achieve  Goals: Good    Frequency Min 3X/week   Barriers to discharge        Co-evaluation               End of Session Equipment Utilized During Treatment: Gait belt Activity Tolerance: Patient tolerated treatment well Patient left: in chair;with call bell/phone within reach;with chair alarm set Nurse Communication: Mobility status PT Visit Diagnosis: Unsteadiness on feet (R26.81)    Time: 1030-1314 PT Time Calculation (min) (ACUTE ONLY): 10 min   Charges:   PT Evaluation $PT Eval Low Complexity: 1 Procedure     PT G Codes:   PT G-Codes **NOT FOR INPATIENT CLASS** Functional Assessment Tool Used: AM-PAC 6 Clicks Basic Mobility;Clinical judgement Functional Limitation: Mobility: Walking and moving around Mobility: Walking and Moving Around Current Status (H8887): At least 20 percent but less than 40 percent impaired, limited or restricted Mobility: Walking and Moving Around Goal Status [phone]): At least 1 percent but less than 20 percent impaired, limited or restricted    Carmelia Bake, PT, DPT 01/17/2017 Pager: [phone]   York Ram E 01/17/2017, 12:26 PM

## 2017-01-18 DIAGNOSIS — G9341 Metabolic encephalopathy: Secondary | ICD-10-CM | POA: Diagnosis not present

## 2017-01-18 DIAGNOSIS — E875 Hyperkalemia: Secondary | ICD-10-CM | POA: Diagnosis not present

## 2017-01-18 DIAGNOSIS — N183 Chronic kidney disease, stage 3 (moderate): Secondary | ICD-10-CM | POA: Diagnosis not present

## 2017-01-18 DIAGNOSIS — E860 Dehydration: Secondary | ICD-10-CM

## 2017-01-18 LAB — BASIC METABOLIC PANEL
Anion gap: 7 (ref 5–15)
BUN: 25 mg/dL — ABNORMAL HIGH (ref 6–20)
CO2: 22 mmol/L (ref 22–32)
Calcium: 8.3 mg/dL — ABNORMAL LOW (ref 8.9–10.3)
Chloride: 109 mmol/L (ref 101–111)
Creatinine, Ser: 1.1 mg/dL (ref 0.61–1.24)
GFR calc Af Amer: >60 mL/min (ref >60)
GFR calc non Af Amer: >60 mL/min (ref >60)
Glucose, Bld: 163 mg/dL — ABNORMAL HIGH (ref 65–99)
Potassium: 4.5 mmol/L (ref 3.5–5.1)
Sodium: 138 mmol/L (ref 135–145)

## 2017-01-18 LAB — CBC
HCT: 28.5 % — ABNORMAL LOW (ref 39.0–52.0)
Hemoglobin: 9.2 g/dL — ABNORMAL LOW (ref 13.0–17.0)
MCH: 30.7 pg (ref 26.0–34.0)
MCHC: 32.3 g/dL (ref 30.0–36.0)
MCV: 95 fL (ref 78.0–100.0)
Platelets: 315 10*3/uL (ref 150–400)
RBC: 3 MIL/uL — ABNORMAL LOW (ref 4.22–5.81)
RDW: 14.2 % (ref 11.5–15.5)
WBC: 12.5 10*3/uL — ABNORMAL HIGH (ref 4.0–10.5)

## 2017-01-18 LAB — GLUCOSE, CAPILLARY
Glucose-Capillary: 184 mg/dL — ABNORMAL HIGH (ref 65–99)
Glucose-Capillary: 173 mg/dL — ABNORMAL HIGH (ref 65–99)
Glucose-Capillary: 172 mg/dL — ABNORMAL HIGH (ref 65–99)
Glucose-Capillary: 277 mg/dL — ABNORMAL HIGH (ref 65–99)

## 2017-01-18 LAB — URINE CULTURE: Culture: >=100000 — AB

## 2017-01-18 LAB — LACTIC ACID, PLASMA: Lactic Acid, Venous: 3.6 mmol/L (ref 0.5–1.9)

## 2017-01-18 LAB — FOLATE: Folate: 47.1 ng/mL (ref >5.9)

## 2017-01-18 NOTE — Care Management Obs Status (Signed)
[facility] NOTIFICATION   Patient Details  Name: Clements Toro MRN: [id number] Date of Birth: 08/15/1946   Medicare Observation Status Notification Given:  Yes    Erenest Rasher, RN 01/18/2017, 7:00 PM

## 2017-01-18 NOTE — Progress Notes (Signed)
Initial Nutrition Assessment  INTERVENTION:   -Provide Ensure Enlive po BID, each supplement provides 350 kcal and 20 grams of protein -Placed patient on Meal order with assist per RN request -Encourage PO intake -RD to continue to monitor for needs  NUTRITION DIAGNOSIS:   Unintentional weight loss related to chronic illness as evidenced by percent weight loss.  GOAL:   Patient will meet greater than or equal to 90% of their needs  MONITOR:   PO intake, Supplement acceptance, Labs, Weight trends, I & O's  REASON FOR ASSESSMENT:   Consult Assessment of nutrition requirement/status  ASSESSMENT:   71 y.o. malewith medical history significant of prior CVA, multiple admissions to the hospital for DKA, hypertension depression and narcotic abuse; who was admitted with acute on chronic renal failure, hyperglycemia, AMS, sepsis due to UTI and hyperkalemia.  Patient in room eating lunch meal. Pt consumed 100% of his pork, green beans and macaroni and cheese for lunch. PO intakes have ranged: 90-100%. Pt has a good appetite and was eating well PTA. Pt states he typically eats 2.5 meals a day.  RN requested that pt be placed on meal order with assist, as patient has forgotten to order meals at times. Pt likes his Ensure supplements and wants to continue order. RN mixes them with ice cream which he prefers.  Per chart review, pt has lost 5 lb since 3/6 (4% wt loss x 1 month, insignificant for time frame). Nutrition focused physical exam shows no sign of depletion of muscle mass or body fat.  Medications: Thiamine tablet daily  Labs reviewed: CBGs: 173-184  Diet Order:  Diet Carb Modified Fluid consistency: Thin; Room service appropriate? Yes with Assist  Skin:  Reviewed, no issues  Last BM:  4/5  Height:   Ht Readings from Last 1 Encounters:  01/16/17 5\' 9"  (1.753 m)    Weight:   Wt Readings from Last 1 Encounters:  01/16/17 127 lb (57.6 kg)    Ideal Body Weight:  72.7  kg  BMI:  Body mass index is 18.75 kg/m.  Estimated Nutritional Needs:   Kcal:  1400-1600  Protein:  65-75g  Fluid:  1.6L/day  EDUCATION NEEDS:   No education needs identified at this time  [name] Bibles, MS, RD, LDN Pager: [phone] After Hours Pager: [phone]

## 2017-01-18 NOTE — Progress Notes (Signed)
Pt had an uneventful day. Cont with plan of care 

## 2017-01-18 NOTE — Progress Notes (Signed)
[facility] PROGRESS NOTE  [name] [name] DEY:[id number] DOB: 09/01/1946 DOA: 01/16/2017 PCP: No PCP Per Patient  Interim summary and HPI  71 y.o. male with medical history significant of prior CVA, multiple admissions to the hospital for DKA, hypertension depression and narcotic abuse; who was admitted with acute on chronic renal failure, hyperglycemia, AMS, sepsis due to UTI and hyperkalemia.  Assessment/Plan: 1-acute encephalopathy: metabolic and toxic (due to medications and UTI) -improved and has currently remained oriented X3 -will continue IVF's and minimization of agents that can affect sensorium -will continue treatment for UTI -continue supportive care  2-SEPSIS due to klebsiella UTI -patient with elevated WBC's, AMS, elevated lactic acid and acute on chronic renal failure. 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 -will continue SSI and lantus -modified carb diet ordered -CBG's less than 200, will monitor  5-HTN -will continue norvasc and coreg -BP stable  6-hyperkalemia: -received kayexalate  -also insulin therapy and IVF's were provided -K WNL now, will monitor  7-depression -continue fluoxetine  8-chronic pain syndrome: due to back pain. ongoing narcotic abuse -will no provide prescriptions for opiates -patient needs outpatient pain clinic   9-hx of CVA: with residual speech impairment. -will continue aspirin for secondary prevention -PT/OT has seen patient and recommended [facility] services.  Code Status: Full Family Communication: no family at bedside  Disposition Plan: remains in  the hospital; when medically stable will arrange for home health services. Continue IVF's and IV antibiotics for his klebsiella UTI; follow renal function and lactic acid.  Consultants:  None   Procedures:  See below for x-ray reports   Antibiotics:  Rocephin 01/16/17  HPI/Subjective: Patient afebrile, oriented X3; complaining still of some back pain and feeling weak. Reported no nausea, no vomiting and feeling better overall.  Objective: Vitals:   01/18/17 0602 01/18/17 1354  BP: (!) 154/66 130/63  Pulse: 64 67  Resp: 20 16  Temp: 98 F (36.7 C) 97.8 F (36.6 C)    Intake/Output Summary (Last 24 hours) at 01/18/17 1651 Last data filed at 01/18/17 1500  Gross per 24 hour  Intake             3920 ml  Output             2000 ml  Net             1920 ml   Filed Weights   01/16/17 2207  Weight: 57.6 kg (127 lb)    Exam:   General: afebrile currently, AAOX3, reports some pain in his back still present; no SOB, no nausea, no vomiting. Feeling better and currently not using any oxygen supplementation.  Cardiovascular: S1 and S2, no rubs or gallops  Respiratory: good air movement, no wheezing, no crackles  Abdomen: soft, positive BS, no guarding  Musculoskeletal: trace edema bilaterally, no cyanosis   Data Reviewed: Basic Metabolic Panel:  Recent Labs Lab 01/16/17 1745 01/16/17 2001 01/17/17 0553 01/18/17 0559  NA 134* 138 140 138  K 5.6* 5.6* 5.0 4.5  CL 102 104 109 109  CO2 20* 22 20* 22  GLUCOSE 493* 386* 240* 163*  BUN 44* 44* 32* 25*  CREATININE 1.91* 1.88*  1.44* 1.10  CALCIUM 9.1 9.2 8.7* 8.3*  MG  --   --  2.4  --   PHOS  --   --  3.4  --    Liver Function Tests:  Recent Labs Lab 01/16/17 2001 01/17/17 0553  AST 22 17  ALT 13* 11*  ALKPHOS 159* 139*  BILITOT 0.7 0.7  PROT 8.0 7.3  ALBUMIN 3.5 3.1*   CBC:  Recent Labs Lab 01/16/17 1335 01/17/17 0553 01/18/17 0559  WBC 11.9* 19.1* 12.5*  NEUTROABS 10.3*  --   --   HGB 9.7*  10.8* 9.2*  HCT 29.6* 32.8* 28.5*  MCV 95.5 94.0 95.0  PLT 311 283 315   CBG:  Recent Labs Lab 01/17/17 1136 01/17/17 1638 01/17/17 2119 01/18/17 0736 01/18/17 1137  GLUCAP 165* 252* 207* 184* 173*    Recent Results (from the past 240 hour(s))  Urine culture     Status: Abnormal   Collection Time: 01/16/17  2:43 PM  Result Value Ref Range Status   Specimen Description URINE, RANDOM  Final   Special Requests NONE  Final   Culture >=100,000 COLONIES/mL KLEBSIELLA PNEUMONIAE (A)  Final   Report Status 01/18/2017 FINAL  Final   Organism ID, Bacteria KLEBSIELLA PNEUMONIAE (A)  Final      Susceptibility   Klebsiella pneumoniae - MIC*    AMPICILLIN RESISTANT Resistant     CEFAZOLIN <=4 SENSITIVE Sensitive     CEFTRIAXONE <=1 SENSITIVE Sensitive     CIPROFLOXACIN <=0.25 SENSITIVE Sensitive     GENTAMICIN <=1 SENSITIVE Sensitive     IMIPENEM <=0.25 SENSITIVE Sensitive     NITROFURANTOIN 64 INTERMEDIATE Intermediate     TRIMETH/SULFA <=20 SENSITIVE Sensitive     AMPICILLIN/SULBACTAM 4 SENSITIVE Sensitive     PIP/TAZO <=4 SENSITIVE Sensitive     Extended ESBL NEGATIVE Sensitive     * >=100,000 COLONIES/mL KLEBSIELLA PNEUMONIAE  MRSA PCR Screening     Status: None   Collection Time: 01/16/17 11:29 PM  Result Value Ref Range Status   MRSA by PCR NEGATIVE NEGATIVE Final    Comment:        The GeneXpert MRSA Assay (FDA approved for NASAL specimens only), is one component of a comprehensive MRSA colonization surveillance program. It is not intended to diagnose MRSA infection nor to guide or monitor treatment for MRSA infections.      Studies: No results found.  Scheduled Meds: . amLODipine  10 mg Oral Daily  . aspirin EC  81 mg Oral Daily  . carvedilol  6.25 mg Oral BID WC  . cefTRIAXone (ROCEPHIN) IVPB 1 gram/50 mL D5W  1 g Intravenous Q24H  . enoxaparin (LOVENOX) injection  40 mg Subcutaneous QHS  . feeding supplement (ENSURE ENLIVE)  237 mL Oral BID BM  .  FLUoxetine  20 mg Oral Daily  . insulin aspart  0-15 Units Subcutaneous TID WC  . insulin aspart  0-5 Units Subcutaneous QHS  . insulin glargine  20 Units Subcutaneous Daily  . sodium chloride flush  3 mL Intravenous Q12H  . thiamine  100 mg Oral Daily   Continuous Infusions: . sodium chloride 100 mL/hr at 01/18/17 1335     Time spent: 25 minutes   Barton Dubois  [facility] Pager [phone]. If 7PM-7AM, please contact night-coverage at [email or web address], password [facility] 01/18/2017, 4:51 PM  LOS: 0 days

## 2017-01-18 NOTE — Care Management Note (Signed)
Case Management Note  Patient Details  Name: [name] [name] MRN: [id number] Date of Birth: 10-06-1946  Subjective/Objective:   DKA               Action/Plan: Discharge Planning: Pt states he does not recall [facility] coming out in March. Pt's HH was arranged with [facility]. Will need HH RN orders with [facility] and [facility] states no DME in the home. His dtr assist as needed.    Expected Discharge Date:   (unknown)               Expected Discharge Plan:  [place]  In-House Referral:  NA  Discharge planning Services  CM Consult  Post Acute Care Choice:  NA Choice offered to:  Patient  DME Arranged:  N/A DME Agency:     HH Arranged:    [place] Agency:     Status of Service:  In process, will continue to follow  If discussed at Long Length of Stay Meetings, dates discussed:    Additional Comments:  Erenest Rasher, RN 01/18/2017, 7:04 PM

## 2017-01-19 DIAGNOSIS — N390 Urinary tract infection, site not specified: Secondary | ICD-10-CM | POA: Diagnosis not present

## 2017-01-19 DIAGNOSIS — E872 Acidosis: Secondary | ICD-10-CM | POA: Diagnosis not present

## 2017-01-19 DIAGNOSIS — E1121 Type 2 diabetes mellitus with diabetic nephropathy: Secondary | ICD-10-CM | POA: Diagnosis not present

## 2017-01-19 DIAGNOSIS — E1165 Type 2 diabetes mellitus with hyperglycemia: Secondary | ICD-10-CM | POA: Diagnosis not present

## 2017-01-19 DIAGNOSIS — Z794 Long term (current) use of insulin: Secondary | ICD-10-CM | POA: Diagnosis not present

## 2017-01-19 DIAGNOSIS — N179 Acute kidney failure, unspecified: Secondary | ICD-10-CM | POA: Diagnosis not present

## 2017-01-19 DIAGNOSIS — G9341 Metabolic encephalopathy: Secondary | ICD-10-CM | POA: Diagnosis not present

## 2017-01-19 DIAGNOSIS — E860 Dehydration: Secondary | ICD-10-CM | POA: Diagnosis not present

## 2017-01-19 DIAGNOSIS — A419 Sepsis, unspecified organism: Secondary | ICD-10-CM | POA: Diagnosis not present

## 2017-01-19 LAB — GLUCOSE, CAPILLARY
Glucose-Capillary: 43 mg/dL — CL (ref 65–99)
Glucose-Capillary: 151 mg/dL — ABNORMAL HIGH (ref 65–99)
Glucose-Capillary: 218 mg/dL — ABNORMAL HIGH (ref 65–99)
Glucose-Capillary: 166 mg/dL — ABNORMAL HIGH (ref 65–99)
Glucose-Capillary: 247 mg/dL — ABNORMAL HIGH (ref 65–99)

## 2017-01-19 LAB — BASIC METABOLIC PANEL
Anion gap: 8 (ref 5–15)
BUN: 17 mg/dL (ref 6–20)
CO2: 23 mmol/L (ref 22–32)
Calcium: 8.5 mg/dL — ABNORMAL LOW (ref 8.9–10.3)
Chloride: 105 mmol/L (ref 101–111)
Creatinine, Ser: 1.2 mg/dL (ref 0.61–1.24)
GFR calc Af Amer: >60 mL/min (ref >60)
GFR calc non Af Amer: 59 mL/min — ABNORMAL LOW (ref >60)
Glucose, Bld: 250 mg/dL — ABNORMAL HIGH (ref 65–99)
Potassium: 4.2 mmol/L (ref 3.5–5.1)
Sodium: 136 mmol/L (ref 135–145)

## 2017-01-19 LAB — LACTIC ACID, PLASMA: Lactic Acid, Venous: 2.9 mmol/L (ref 0.5–1.9)

## 2017-01-19 LAB — HEMOGLOBIN A1C
Hgb A1c MFr Bld: 10 % — ABNORMAL HIGH (ref 4.8–5.6)
Mean Plasma Glucose: 240 mg/dL

## 2017-01-19 MED ORDER — CEPHALEXIN 500 MG PO CAPS
500.0000 mg | ORAL_CAPSULE | Freq: Two times a day (BID) | ORAL | Status: DC
  Administered 2017-01-19 – 2017-01-20 (×2): 500 mg via ORAL
  Filled 2017-01-19 (×2): qty 1

## 2017-01-19 MED ORDER — SODIUM CHLORIDE 0.9 % IV SOLN
INTRAVENOUS | Status: DC
Start: 2017-01-19 — End: 2017-01-20
  Administered 2017-01-19 – 2017-01-20 (×2): via INTRAVENOUS

## 2017-01-19 NOTE — Progress Notes (Signed)
Inpatient Diabetes Program Recommendations  AACE/ADA: New Consensus Statement on Inpatient Glycemic Control (2015)  Target Ranges:  Prepandial:   less than 140 mg/dL      Peak postprandial:   less than 180 mg/dL (1-2 hours)      Critically ill patients:  140 - 180 mg/dL   Lab Results  Component Value Date   GLUCAP 151 (H) 01/19/2017   HGBA1C 10.0 (H) 01/17/2017    Review of Glycemic Control  Diabetes history: DM2 Outpatient Diabetes medications: Lantus 10 units QHS, Novolog 20 units bid Current orders for Inpatient glycemic control: Lantus 20 units QHS, Novolog 0-15 units tidwc and hs  Spoke with pt regarding his diabetes control at home. States he uses an insulin pen and never checks his blood sugars. "I've been doing this for 40 years and I know what to do." Asked if he had hypoglycemia and he said occasionally. He treats with eating a meal. Instructed to drink 1/2 c juice and check his blood sugar in 15 minutes. Explained importance of checking his sugars and taking logbook to MD for any needed adjustments. HgbA1C is 10.0% - up from 8.5% on 12/29/2016. Needs small improvement. Had hypoglycemia this am. Adjust basal insulin. May need meal coverage insulin with lower basal. States daughter checks on him, but does not come every day.  Inpatient Diabetes Program Recommendations:    Decrease Lantus to 12 units QHS Add Novolog 3 units tidwc for meal coverage insulin. Needs home health for his diabetes.  Will follow. Thank you. Lorenda Peck, RD, LDN, CDE Inpatient Diabetes Coordinator [phone]

## 2017-01-19 NOTE — Clinical Social Work Note (Signed)
Clinical Social Work Assessment  Patient Details  Name: [name] [name] MRN: [id number] Date of Birth: 11/23/1945  Date of referral:  01/19/17               Reason for consult:  Substance Use/ETOH Abuse                Permission sought to share information with:    Permission granted to share information::  No  Name::        Agency::     Relationship::     Contact Information:     Housing/Transportation Living arrangements for the past 2 months:  Single Family Home Source of Information:  Patient Patient Interpreter Needed:  None Criminal Activity/Legal Involvement Pertinent to Current Situation/Hospitalization:  No - Comment as needed Significant Relationships:  Adult Children Lives with:  Self Do you feel safe going back to the place where you live?  Yes Need for family participation in patient care:  No (Coment)  Care giving concerns:  CSW consulted to speak with patient about substance abuse. Patient denies SA and reports no concerns.   Social Worker assessment / plan:  CSW spoke with patient at bedside. Patient alert and oriented x4. Patient denies SA and reports that he fell which is why he's in the hospital.CSW inquired about patient being found unresponsive and daughter not being able to wake him. Patient stated "I dont know what she was unable to do". Patient reports that its been years since he used drugs. Patient reports that he resides alone and that he is able to complete ADLs by himself. Patient reports that his daughter comes to check on him every other day depending on her schedule. CSW provided patient with outpatient resources to have for future reference if needed.    Employment status:  Retired Nurse, adult PT Recommendations:  Home with [place] / Referral to community resources:  Outpatient Substance Abuse Treatment Options  Patient/Family's Response to care:  Patient denies substance abuse and reports no needs.    Patient/Family's Understanding of and Emotional Response to Diagnosis, Current Treatment, and Prognosis: Patient had difficulty verbalizing understanding of diagnosis. Patient guarded and short when speaking with CSW.   Emotional Assessment Appearance:    Attitude/Demeanor/Rapport:  Guarded Affect (typically observed):  Defensive, Guarded Orientation:  Oriented to Self, Oriented to Place, Oriented to  Time, Oriented to Situation Alcohol / Substance use:  Illicit Drugs Psych involvement (Current and /or in the community):  No (Comment)  Discharge Needs  Concerns to be addressed:  Substance Abuse Concerns Readmission within the last 30 days:  Yes Current discharge risk:  None Barriers to Discharge:  No Barriers Identified   Burnis Medin, LCSW 01/19/2017, 10:50 AM

## 2017-01-19 NOTE — Progress Notes (Addendum)
OT Cancellation Note  Patient Details Name: [name] [name] MRN: [id number] DOB: 05-16-1946   Cancelled Treatment:    Reason Eval/Treat Not Completed: Other (comment); Pt declining therapy at this, Pt had just woken up and states he does not feel like doing anything despite attempts and encouragement from RN and OT. Pt states he prefers to know ahead of time when therapy is coming [employer] banker, [employer] and OT explained this may not always be possible.) Pt agreeable to OT returning at a later time today. Will plan to check back as time permits.   [name], OT Pager [phone] 01/19/2017   [name] 01/19/2017, 10:20 AM

## 2017-01-19 NOTE — Progress Notes (Signed)
PHARMACY NOTE:  ANTIMICROBIAL RENAL DOSAGE ADJUSTMENT  Current antimicrobial regimen includes a mismatch between antimicrobial dosage and estimated renal function.  As per policy approved by the Pharmacy & Therapeutics and Medical Executive Committees, the antimicrobial dosage will be adjusted accordingly.  Current antimicrobial dosage:  Keflex 500 mg PO q8h  Indication: UTI  Renal Function:  Estimated Creatinine Clearance: 46 mL/min (by C-G formula based on SCr of 1.2 mg/dL).    Antimicrobial dosage has been changed to:  Keflex 500 mg PO q12h  Thank you for allowing pharmacy to be a part of this patient's care.  Hershal Coria, [facility] 01/19/2017 2:20 PM

## 2017-01-19 NOTE — Progress Notes (Signed)
qPhysical Therapy Treatment Patient Details Name: [name] [name] MRN: [id number] DOB: April 12, 1946 Today's Date: 01/19/2017    History of Present Illness 71 y.o. male with medical history significant of prior CVA, multiple admissions to the hospital for DKA, diabetes mellitus, hypertension, polysubstance abuse admitted with AKI, hyperglycemia, hyperkalemia and UTI    PT Comments    Pt tolerated increased distance of 190' with ambulation, steady with RW, vitals stable.   Follow Up Recommendations  Home health PT;Supervision - Intermittent     Equipment Recommendations  Rolling walker with 5" wheels    Recommendations for Other Services       Precautions / Restrictions Precautions Precautions: Sternal Restrictions Weight Bearing Restrictions: No    Mobility  Bed Mobility Overal bed mobility: Modified Independent Bed Mobility: Supine to Sit     Supine to sit: HOB elevated;Modified independent (Device/Increase time)        Transfers Overall transfer level: Needs assistance Equipment used: Rolling walker (2 wheeled) Transfers: Sit to/from Stand Sit to Stand: Supervision         General transfer comment:  cues for hand placement  Ambulation/Gait Ambulation/Gait assistance: Supervision Ambulation Distance (Feet): 190 Feet Assistive device: Rolling walker (2 wheeled) Gait Pattern/deviations: Step-through pattern;Decreased stride length   Gait velocity interpretation: at or above normal speed for age/gender General Gait Details: steady with RW, no LOB, SaO2 100% RA, HR 80s, no dyspnea   Stairs            Wheelchair Mobility    Modified Rankin (Stroke Patients Only)       Balance                                            Cognition Arousal/Alertness: Awake/alert Behavior During Therapy: WFL for tasks assessed/performed Overall Cognitive Status: Within Functional Limits for tasks assessed                                  General Comments: pt appropriate, pleasant and follows commands, states no falls however found down leading to admission, uncertain of PLOF accuracy      Exercises      General Comments        Pertinent Vitals/Pain Pain Assessment: No/denies pain    Home Living                      Prior Function            PT Goals (current goals can now be found in the care plan section) Acute Rehab PT Goals Patient Stated Goal: home, likes to work on antique cars PT Goal Formulation: With patient Time For Goal Achievement: 01/24/17 Potential to Achieve Goals: Good Progress towards PT goals: Progressing toward goals    Frequency    Min 3X/week      PT Plan Current plan remains appropriate    Co-evaluation             End of Session Equipment Utilized During Treatment: Gait belt Activity Tolerance: Patient tolerated treatment well Patient left: with call bell/phone within reach;in bed Nurse Communication: Mobility status PT Visit Diagnosis: Unsteadiness on feet (R26.81)     Time: 2423-5361 PT Time Calculation (min) (ACUTE ONLY): 12 min  Charges:  $Gait Training: 8-22 mins  G Codes:          [name] [name] 01/19/2017, 1:04 PM [phone]

## 2017-01-19 NOTE — Progress Notes (Signed)
Hypoglycemic Event   At 0740 CBG: 43  Treatment: orange juice and ordered breakfast  Symptoms:NO symptons noted, pt a/o talking appropriately  Follow-up CBG: [id number] CBG Result:151  Possible Reasons for Event: last meal and insulin administration time regulation  Comments/MD notified: NO----prototcol implemented pt without symptons.    Lelon Frohlich, Skyelynn Rambeau R

## 2017-01-19 NOTE — Progress Notes (Signed)
Occupational Therapy Treatment Patient Details Name: [name] [name] MRN: [id number] DOB: 10-Apr-1946 Today's Date: 01/19/2017    History of present illness 71 y.o. male with medical history significant of prior CVA, multiple admissions to the hospital for DKA, diabetes mellitus, hypertension, polysubstance abuse admitted with AKI, hyperglycemia, hyperkalemia and UTI   OT comments  Pt progressing towards goals; completed standing grooming ADL during session. Declined completing additional ADL tasks during this session. Continue per POC.      Follow Up Recommendations  Home health OT;Supervision/Assistance - 24 hour    Equipment Recommendations   (Daughter looking into shower seat)          Precautions / Restrictions Precautions Precautions: Fall Restrictions Weight Bearing Restrictions: No       Mobility Bed Mobility Overal bed mobility: Modified Independent Bed Mobility: Sit to Supine;Supine to Sit     Supine to sit: HOB elevated        Transfers Overall transfer level: Needs assistance Equipment used: None Transfers: Sit to/from Stand Sit to Stand: Supervision         General transfer comment: Supervision for completing sit to/from stand; MinGuard for functional mobility and ADL task completion                                               ADL either performed or assessed with clinical judgement   ADL       Grooming: Wash/dry face;Wash/dry hands;Min guard;Standing               Lower Body Dressing: Minimal assistance;Sit to/from stand (MinGuard sit to/from stand; MinA donning socks)                 General ADL Comments: Pt completed room level functional mobility to stand at sink without RW with some unsteadiness noted; completed standing grooming washing face using sink for UE support. Declined completing additional self care tasks this session.                       Cognition Arousal/Alertness:  Awake/alert Behavior During Therapy: WFL for tasks assessed/performed Overall Cognitive Status: Within Functional Limits for tasks assessed                                 General Comments: pt appropriate, pleasant and follows commands, states no falls however found down leading to admission, uncertain of PLOF accuracy                          Pertinent Vitals/ Pain       Pain Assessment: No/denies pain                                                          Frequency  Min 2X/week        Progress Toward Goals  OT Goals(current goals can now be found in the care plan section)  Progress towards OT goals: Progressing toward goals  Acute Rehab OT Goals Patient Stated Goal: home, likes to work on antique cars OT Goal Formulation: With patient Time For Goal  Achievement: 01/24/17 Potential to Achieve Goals: Good  Plan Discharge plan remains appropriate                     End of Session    OT Visit Diagnosis: Unsteadiness on feet (R26.81)   Activity Tolerance Patient tolerated treatment well   Patient Left in bed;with call bell/phone within reach   Nurse Communication      Functional Assessment Tool Used: Clinical judgement Functional Limitation: Self care Self Care Current Status (Y7741): At least 20 percent but less than 40 percent impaired, limited or restricted Self Care Goal Status (O8786): At least 1 percent but less than 20 percent impaired, limited or restricted   Time: 7672-0947 OT Time Calculation (min): 12 min  Charges: OT G-codes **NOT FOR INPATIENT CLASS** Functional Assessment Tool Used: Clinical judgement Functional Limitation: Self care Self Care Current Status (S9628): At least 20 percent but less than 40 percent impaired, limited or restricted Self Care Goal Status (Z6629): At least 1 percent but less than 20 percent impaired, limited or restricted OT General Charges $OT Visit: 1  Procedure OT Treatments $Self Care/Home Management : 8-22 mins  [name], [place] Pager [phone] 01/19/2017    [name] 01/19/2017, 2:54 PM

## 2017-01-19 NOTE — Progress Notes (Signed)
[name] [name]  Tremel Setters WER:[id number] DOB: 14-Nov-1945 DOA: 01/16/2017 PCP: No PCP Per Patient  Interim summary and HPI  71 y.o. male with medical history significant of prior CVA, multiple admissions to the hospital for DKA, hypertension depression and narcotic abuse; who was admitted with acute on chronic renal failure, hyperglycemia, AMS, sepsis due to UTI and hyperkalemia.  Assessment/Plan: 1-acute encephalopathy: metabolic and toxic (due to medications and UTI) -improved and has currently remained oriented X3 -will continue IVF's and minimization of agents that can affect sensorium -will continue treatment for UTI -continue supportive care  2-SEPSIS due to klebsiella UTI -patient with elevated WBC's, AMS, elevated lactic acid and acute on chronic renal failure. -continue IVF's, rate adjusted -following sensitivity and cx's results will transition abx;s to PO (keflex) -Lactic acid still up today 2.6   3-acute on chronic renal failure: stage 3-4 at baseline -due to dehydration and UTI -will continue IVF's and antibiotics therapy -will follow renal function trend -Cr 1.20 now  4-uncontrolled diabetes with nephropathy: multiple admissions for hyperglycemia and DKA. -will continue SSI and lantus, last one adjusted for better control  -modified carb diet ordered -CBG's less than 250, will monitor  5-HTN -will continue norvasc and coreg -BP stable  6-hyperkalemia: -received kayexalate  -also insulin therapy and IVF's were provided -K WNL now, will monitor trend  7-depression -continue fluoxetine  8-chronic pain syndrome: due to back pain. ongoing narcotic abuse -will no provide prescriptions for opiates -patient needs outpatient pain clinic   9-hx of CVA: with residual speech impairment. -will continue aspirin for secondary prevention -PT/OT has seen patient and recommended [facility] services.  Code Status: Full Family Communication: no family at bedside   Disposition Plan: remains in the hospital overnight; continue IVF's for elevated lactic acid and switch to oral antibiotics. Will arrange [facility] services. Most likely home in am.   Consultants:  None   Procedures:  See below for x-ray reports   Antibiotics:  Rocephin 01/16/17>>01/19/17  Keflex 01/19/17  HPI/Subjective: Patient afebrile, oriented X3; complaining still of some back pain and feeling weak. Reported no nausea, no vomiting and feeling much better overall.  Objective: Vitals:   01/19/17 0417 01/19/17 1116  BP: (!) 159/67 (!) 144/72  Pulse: 62   Resp: 20   Temp: 99.2 F (37.3 C)     Intake/Output Summary (Last 24 hours) at 01/19/17 1408 Last data filed at 01/19/17 0825  Gross per 24 hour  Intake             3260 ml  Output             2000 ml  Net             1260 ml   Filed Weights   01/16/17 2207  Weight: 57.6 kg (127 lb)    Exam:   General: afebrile currently, AAOX3, reports some pain in his back still present; no SOB, no nausea, no vomiting. Feeling better and currently not using any oxygen supplementation.  Cardiovascular: S1 and S2, no rubs or gallops  Respiratory: good air movement, no wheezing, no crackles  Abdomen: soft, positive BS, no guarding  Musculoskeletal: trace edema bilaterally, no cyanosis   Data Reviewed: Basic Metabolic Panel:  Recent Labs Lab 01/16/17 1745 01/16/17 2001 01/17/17 0553 01/18/17 0559 01/19/17 1304  NA 134* 138 140 138 136  K 5.6* 5.6* 5.0 4.5 4.2  CL 102 104 109 109 105  CO2 20* 22 20* 22 23  GLUCOSE 493* 386* 240*  163* 250*  BUN 44* 44* 32* 25* 17  CREATININE 1.91* 1.88* 1.44* 1.10 1.20  CALCIUM 9.1 9.2 8.7* 8.3* 8.5*  MG  --   --  2.4  --   --   PHOS  --   --  3.4  --   --    Liver Function Tests:  Recent Labs Lab 01/16/17 2001 01/17/17 0553  AST 22 17  ALT 13* 11*  ALKPHOS 159* 139*  BILITOT 0.7 0.7  PROT 8.0 7.3  ALBUMIN 3.5 3.1*   CBC:  Recent Labs Lab 01/16/17 1335 01/17/17 0553  01/18/17 0559  WBC 11.9* 19.1* 12.5*  NEUTROABS 10.3*  --   --   HGB 9.7* 10.8* 9.2*  HCT 29.6* 32.8* 28.5*  MCV 95.5 94.0 95.0  PLT 311 283 315   CBG:  Recent Labs Lab 01/18/17 1635 01/18/17 2027 01/19/17 0729 01/19/17 0808 01/19/17 1158  GLUCAP 172* 277* 43* 151* 218*    Recent Results (from the past 240 hour(s))  Urine culture     Status: Abnormal   Collection Time: 01/16/17  2:43 PM  Result Value Ref Range Status   Specimen Description URINE, RANDOM  Final   Special Requests NONE  Final   Culture >=100,000 COLONIES/mL KLEBSIELLA PNEUMONIAE (A)  Final   Report Status 01/18/2017 FINAL  Final   Organism ID, Bacteria KLEBSIELLA PNEUMONIAE (A)  Final      Susceptibility   Klebsiella pneumoniae - MIC*    AMPICILLIN RESISTANT Resistant     CEFAZOLIN <=4 SENSITIVE Sensitive     CEFTRIAXONE <=1 SENSITIVE Sensitive     CIPROFLOXACIN <=0.25 SENSITIVE Sensitive     GENTAMICIN <=1 SENSITIVE Sensitive     IMIPENEM <=0.25 SENSITIVE Sensitive     NITROFURANTOIN 64 INTERMEDIATE Intermediate     TRIMETH/SULFA <=20 SENSITIVE Sensitive     AMPICILLIN/SULBACTAM 4 SENSITIVE Sensitive     PIP/TAZO <=4 SENSITIVE Sensitive     Extended ESBL NEGATIVE Sensitive     * >=100,000 COLONIES/mL KLEBSIELLA PNEUMONIAE  MRSA PCR Screening     Status: None   Collection Time: 01/16/17 11:29 PM  Result Value Ref Range Status   MRSA by PCR NEGATIVE NEGATIVE Final    Comment:        The GeneXpert MRSA Assay (FDA approved for NASAL specimens only), is one component of a comprehensive MRSA colonization surveillance program. It is not intended to diagnose MRSA infection nor to guide or monitor treatment for MRSA infections.      Studies: No results found.  Scheduled Meds: . amLODipine  10 mg Oral Daily  . aspirin EC  81 mg Oral Daily  . carvedilol  6.25 mg Oral BID WC  . cefTRIAXone (ROCEPHIN) IVPB 1 gram/50 mL D5W  1 g Intravenous Q24H  . enoxaparin (LOVENOX) injection  40 mg  Subcutaneous QHS  . feeding supplement (ENSURE ENLIVE)  237 mL Oral BID BM  . FLUoxetine  20 mg Oral Daily  . insulin aspart  0-15 Units Subcutaneous TID WC  . insulin aspart  0-5 Units Subcutaneous QHS  . insulin glargine  20 Units Subcutaneous Daily  . sodium chloride flush  3 mL Intravenous Q12H  . thiamine  100 mg Oral Daily   Continuous Infusions: . sodium chloride       Time spent: 25 minutes   Barton Dubois  [name] Pager [phone]. If 7PM-7AM, please contact night-coverage at [email or web address], password [facility] 01/19/2017, 2:08 PM  LOS: 0 days

## 2017-01-19 NOTE — Progress Notes (Signed)
CRITICAL VALUE ALERT  Critical value received:  Latic Acid  Date of notification:  01/19/17  Time of notification:  1330  Critical value read back:yes  Nurse who received alert: Theora Gianotti, RN  MD notified (1st page):  Dr. Dyann Kief  Time of first page:  1330  MD notified (2nd page):  Time of second page:  Responding MD:  Dr. Dyann Kief  Time MD responded:  1345  With new orders

## 2017-01-20 DIAGNOSIS — E860 Dehydration: Secondary | ICD-10-CM

## 2017-01-20 DIAGNOSIS — N179 Acute kidney failure, unspecified: Secondary | ICD-10-CM | POA: Diagnosis not present

## 2017-01-20 DIAGNOSIS — N183 Chronic kidney disease, stage 3 (moderate): Secondary | ICD-10-CM | POA: Diagnosis not present

## 2017-01-20 DIAGNOSIS — N390 Urinary tract infection, site not specified: Secondary | ICD-10-CM | POA: Diagnosis not present

## 2017-01-20 DIAGNOSIS — G9341 Metabolic encephalopathy: Secondary | ICD-10-CM | POA: Diagnosis not present

## 2017-01-20 LAB — BASIC METABOLIC PANEL
Anion gap: 6 (ref 5–15)
BUN: 27 mg/dL — ABNORMAL HIGH (ref 6–20)
CO2: 25 mmol/L (ref 22–32)
Calcium: 8.6 mg/dL — ABNORMAL LOW (ref 8.9–10.3)
Chloride: 104 mmol/L (ref 101–111)
Creatinine, Ser: 1.09 mg/dL (ref 0.61–1.24)
GFR calc Af Amer: >60 mL/min (ref >60)
GFR calc non Af Amer: >60 mL/min (ref >60)
Glucose, Bld: 336 mg/dL — ABNORMAL HIGH (ref 65–99)
Potassium: 4.6 mmol/L (ref 3.5–5.1)
Sodium: 135 mmol/L (ref 135–145)

## 2017-01-20 LAB — GLUCOSE, CAPILLARY
Glucose-Capillary: 205 mg/dL — ABNORMAL HIGH (ref 65–99)
Glucose-Capillary: 266 mg/dL — ABNORMAL HIGH (ref 65–99)

## 2017-01-20 LAB — LACTIC ACID, PLASMA: Lactic Acid, Venous: 1.8 mmol/L (ref 0.5–1.9)

## 2017-01-20 MED ORDER — INSULIN ASPART 100 UNIT/ML FLEXPEN: PEN_INJECTOR | SUBCUTANEOUS | 2 refills | Status: DC

## 2017-01-20 MED ORDER — AMLODIPINE BESYLATE 10 MG PO TABS: 10.0000 mg | ORAL_TABLET | Freq: Every day | ORAL | 1 refills | Status: DC

## 2017-01-20 MED ORDER — INSULIN PEN NEEDLE 31G X 5 MM MISC: 2 refills | Status: DC

## 2017-01-20 MED ORDER — CEPHALEXIN 500 MG PO CAPS: 500.0000 mg | ORAL_CAPSULE | Freq: Two times a day (BID) | ORAL | 0 refills | Status: AC

## 2017-01-20 MED ORDER — INSULIN DETEMIR 100 UNIT/ML FLEXPEN: 20.0000 [IU] | PEN_INJECTOR | Freq: Every day | SUBCUTANEOUS | 2 refills | Status: DC

## 2017-01-20 MED ORDER — FLUOXETINE HCL 20 MG PO CAPS: 20.0000 mg | ORAL_CAPSULE | Freq: Every day | ORAL | 1 refills | Status: DC

## 2017-01-20 MED ORDER — ACETAMINOPHEN 325 MG PO TABS: 650.0000 mg | ORAL_TABLET | Freq: Four times a day (QID) | ORAL | 0 refills | Status: DC | PRN

## 2017-01-20 MED ORDER — BLOOD GLUCOSE METER KIT: PACK | 0 refills | Status: DC

## 2017-01-20 MED ORDER — CARVEDILOL 6.25 MG PO TABS: 6.2500 mg | ORAL_TABLET | Freq: Two times a day (BID) | ORAL | 1 refills | Status: DC

## 2017-01-20 MED ORDER — VITAMIN B-12 1000 MCG PO TABS: 1000.0000 ug | ORAL_TABLET | Freq: Every day | ORAL | 1 refills | Status: DC

## 2017-01-20 NOTE — Progress Notes (Signed)
[facility] called and are ware of pt discharging today. Pt states that he has a RW at home.

## 2017-01-20 NOTE — Progress Notes (Signed)
Pt given appointment for PCP with [facility] on 4/19 at 10:30 AM, Pt is aware.

## 2017-01-20 NOTE — Progress Notes (Signed)
Chaplain consulting d/t RN referral.  Pt discharging and in need of clothing.  Chaplain provided clothing from [employer] and provided brief support at bedside.      [facility] Chaplain Jerene Pitch,  MDiv  24h pager [phone]

## 2017-01-20 NOTE — Care Management Note (Signed)
Case Management Note  Patient Details  Name: [name] [name] MRN: [id number] Date of Birth: 16-Jun-1946  Subjective/Objective:  Received call from [facility] for used clothing for patient-texted CSW-deferred to Chaplain-paged chaplain-Matt-will bring used clothing to rm 23min-secy notified. No further CM needs.                  Action/Plan:d/c home w/HHC.   Expected Discharge Date:  01/20/17               Expected Discharge Plan:  [place]  In-House Referral:  NA  Discharge planning Services  CM Consult, Follow-up appt scheduled  Post Acute Care Choice:  NA Choice offered to:  Patient  DME Arranged:  N/A DME Agency:     HH Arranged:    [place] Agency:     Status of Service:  In process, will continue to follow  If discussed at Long Length of Stay Meetings, dates discussed:    Additional Comments:  Dessa Phi, RN 01/20/2017, 2:55 PM

## 2017-01-20 NOTE — Discharge Summary (Signed)
Physician Discharge Summary  [name] [name] IEP:[id number] DOB: 08-Aug-1946 DOA: 01/16/2017  PCP: No PCP Per Patient  Admit date: 01/16/2017 Discharge date: 01/20/2017  Time spent: 35 minutes  Recommendations for Outpatient Follow-up:  1. Repeat BMET to follow electrolytes and renal function  2. Follow CBG's and A1C closely adjust hypoglycemic regimen as needed  3. Reassess BP and adjust antihypertensive regimen as needed    Discharge Diagnoses:  Active Problems:   CKD (chronic kidney disease) stage 3, GFR 30-59 ml/min   Uncontrolled type 2 diabetes mellitus with diabetic nephropathy, with long-term current use of insulin ([name])   Narcotic dependency, continuous ([place])   Uncontrolled type 2 diabetes mellitus with complication ([name])   AKI (acute kidney injury) ([place])   Acute metabolic encephalopathy   Hyperkalemia   Dehydration   Urinary tract infection without hematuria   Discharge Condition: stable and improved. Discharge home with [facility] services. Follow up with [facility] outpatient clinic at [facility] arranged.   Diet recommendation: modified carb diet and heart healthy diet.  Filed Weights   01/16/17 2207  Weight: 57.6 kg (127 lb)    History of present illness:  Please refer to H&P written by Dr. Roel Cluck for further info/details on admission. But briefly, 71 y.o. malewith medical history significant of prior CVA, multiple admissions to the hospital for DKA, hypertension depression and narcotic abuse; who was admitted with acute on chronic renal failure, hyperglycemia, AMS, sepsis due to UTI and hyperkalemia.  Hospital Course:  1-acute encephalopathy: metabolic and toxic (due to medications and UTI) -improved and has currently remained oriented X3 -will complete treatment for UTI -advise to avoid overuse on medications and to keep himself hydrated -HHRN arranged at discharge for transition of care  2-SEPSIS due to klebsiella UTI -patient met sepsis criteria with elevated WBC's, AMS,  elevated lactic acid and acute on chronic renal failure on admission. -advise to keep himself hydrated  -following sensitivity and cx's results abx's were transitioned to Keflex (7 days left at discharge) -Lactic acidosis resolved prior to discharge  3-acute on chronic renal failure: stage 3-4 at baseline -due to dehydration and UTI -will continue IVF's and antibiotics therapy -will recommend BMET at follow up to assess renal function trend  4-uncontrolled diabetes with nephropathy: multiple admissions for hyperglycemia and DKA. -will discharge on SSI and levemir -prescription for glucometer, pen needless and lancets/strips provided  -modified carb diet encouraged   5-HTN -will continue norvasc and coreg -BP stable -advise to follow heart healthy diet   6-hyperkalemia: -received kayexalate  -also insulin therapy and IVF's were provided -K WNL now at discharge  7-depression -continue fluoxetine  8-chronic pain syndrome: due to back pain. 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 -will continue aspirin for secondary prevention -PT/OT has seen patient and recommended [facility] services.   Procedures:  See below for x-ray reports   Consultations:  None   Discharge Exam: Vitals:   01/20/17 0420 01/20/17 0943  BP: 132/69 129/72  Pulse: 70   Resp: 18   Temp: 98.4 F (36.9 C)     General: afebrile currently, AAOX3, reports minimal pain in his back; no SOB, no nausea, no vomiting. Feeling better and currently not using any oxygen supplementation.  Cardiovascular: S1 and S2, no rubs or gallops  Respiratory: good air movement, no wheezing, no crackles  Abdomen: soft, positive BS, no guarding  Musculoskeletal: trace edema bilaterally,  no cyanosis    Discharge Instructions   Discharge Instructions    Diet - low sodium  heart healthy    Complete by:  As directed    Diet Carb Modified    Complete by:  As directed    Discharge instructions    Complete by:  As directed    Take medications as prescribed Keep yourself well hydrated Follow heart healthy and modified carb diet  Follow up/establish care with primary care physician as arranged (01/29/17)     Current Discharge Medication List    START taking these medications   Details  acetaminophen (TYLENOL) 325 MG tablet Take 2 tablets (650 mg total) by mouth every 6 (six) hours as needed for mild pain (or Fever >/= 101). Qty: 30 tablet, Refills: 0    blood glucose meter kit and supplies Dispense based on patient and insurance preference. Use up to four times daily as directed. (FOR ICD-9 250.00, 250.01). Qty: 1 each, Refills: 0    cephALEXin (KEFLEX) 500 MG capsule Take 1 capsule (500 mg total) by mouth every 12 (twelve) hours. Qty: 14 capsule, Refills: 0    insulin aspart (NOVOLOG FLEXPEN) 100 UNIT/ML FlexPen 0-15 Units, Subcutaneous, 3 times daily with meals CBG < 70: implement hypoglycemia protocol CBG 70 - 120: 0 units CBG 121 - 150: 2 units CBG 151 - 200: 3 units CBG 201 - 250: 5 units CBG 251 - 300: 8 units CBG 301 - 350: 11 units CBG 351 - 400: 15 units CBG > 400: call MD Qty: 15 mL, Refills: 2    Insulin Detemir (LEVEMIR) 100 UNIT/ML Pen Inject 20 Units into the skin daily at 10 pm. Qty: 15 mL, Refills: 2    Insulin Pen Needle 31G X 5 MM MISC Use as needed to inject insulin as prescribed Qty: 100 each, Refills: 2    vitamin B-12 (CYANOCOBALAMIN) 1000 MCG tablet Take 1 tablet (1,000 mcg total) by mouth daily. Qty: 30 tablet, Refills: 1      CONTINUE these medications which have CHANGED   Details  amLODipine (NORVASC) 10 MG tablet Take 1 tablet (10 mg total) by mouth daily. Qty: 30 tablet, Refills: 1    carvedilol (COREG) 6.25 MG tablet Take 1 tablet (6.25 mg total) by mouth 2 (two) times daily with a meal. Qty: 60 tablet,  Refills: 1    FLUoxetine (PROZAC) 20 MG capsule Take 1 capsule (20 mg total) by mouth daily. Qty: 30 capsule, Refills: 1      CONTINUE these medications which have NOT CHANGED   Details  aspirin EC 81 MG EC tablet Take 1 tablet (81 mg total) by mouth daily.      STOP taking these medications     insulin aspart (NOVOLOG) 100 UNIT/ML injection      insulin glargine (LANTUS) 100 UNIT/ML injection      cyanocobalamin (,VITAMIN B-12,) 1000 MCG/ML injection        No Known Allergies Follow-up Information    [facility] Follow up on 01/29/2017.   Specialty:  Internal Medicine Why:  Appointment at 10:30 AM. Please keep appointment. Take insurance card with you. Contact information: [place] [phone]           The results of significant diagnostics from this hospitalization (including imaging, microbiology, ancillary and laboratory) are listed below for reference.    Significant Diagnostic Studies: US Renal  Result Date: 12/28/2016 CLINICAL DATA:  Acute renal failure.  Diabetes.  Hypertension. EXAM: RENAL / URINARY TRACT ULTRASOUND COMPLETE COMPARISON:  Abdomen and pelvis CT dated 07/06/2016. FINDINGS: Right Kidney: Length: 11.2 cm. Echogenicity within normal limits. No mass or hydronephrosis visualized. Left Kidney: Length: 11.0 cm. Echogenicity within normal limits. No mass or hydronephrosis visualized. Bladder: Appears normal for degree of bladder distention. IMPRESSION: Normal examination. Electronically Signed   By: Claudie Revering M.D.   On: 12/28/2016 11:42   Dg Chest Portable 1 View  Result Date: 12/28/2016 CLINICAL DATA:  Chest pain, hyperglycemia, hypotension. History of diabetes and hypertension. Former smoker. EXAM: PORTABLE CHEST 1 VIEW COMPARISON:  12/13/2016 FINDINGS: Shallow inspiration. Normal heart size and pulmonary vascularity. No focal airspace disease or consolidation in the lungs. No blunting of  costophrenic angles. No pneumothorax. Mediastinal contours appear intact. IMPRESSION: No active disease. Electronically Signed   By: Lucienne Capers M.D.   On: 12/28/2016 06:03    Microbiology: Recent Results (from the past 240 hour(s))  Urine culture     Status: Abnormal   Collection Time: 01/16/17  2:43 PM  Result Value Ref Range Status   Specimen Description URINE, RANDOM  Final   Special Requests NONE  Final   Culture >=100,000 COLONIES/mL KLEBSIELLA PNEUMONIAE (A)  Final   Report Status 01/18/2017 FINAL  Final   Organism ID, Bacteria KLEBSIELLA PNEUMONIAE (A)  Final      Susceptibility   Klebsiella pneumoniae - MIC*    AMPICILLIN RESISTANT Resistant     CEFAZOLIN <=4 SENSITIVE Sensitive     CEFTRIAXONE <=1 SENSITIVE Sensitive     CIPROFLOXACIN <=0.25 SENSITIVE Sensitive     GENTAMICIN <=1 SENSITIVE Sensitive     IMIPENEM <=0.25 SENSITIVE Sensitive     NITROFURANTOIN 64 INTERMEDIATE Intermediate     TRIMETH/SULFA <=20 SENSITIVE Sensitive     AMPICILLIN/SULBACTAM 4 SENSITIVE Sensitive     PIP/TAZO <=4 SENSITIVE Sensitive     Extended ESBL NEGATIVE Sensitive     * >=100,000 COLONIES/mL KLEBSIELLA PNEUMONIAE  MRSA PCR Screening     Status: None   Collection Time: 01/16/17 11:29 PM  Result Value Ref Range Status   MRSA by PCR NEGATIVE NEGATIVE Final    Comment:        The GeneXpert MRSA Assay (FDA approved for NASAL specimens only), is one component of a comprehensive MRSA colonization surveillance program. It is not intended to diagnose MRSA infection nor to guide or monitor treatment for MRSA infections.      Labs: Basic Metabolic Panel:  Recent Labs Lab 01/16/17 2001 01/17/17 0553 01/18/17 0559 01/19/17 1304 01/20/17 0530  NA 138 140 138 136 135  K 5.6* 5.0 4.5 4.2 4.6  CL 104 109 109 105 104  CO2 22 20* '22 23 25  ' GLUCOSE 386* 240* 163* 250* 336*  BUN 44* 32* 25* 17 27*  CREATININE 1.88* 1.44* 1.10 1.20 1.09  CALCIUM 9.2 8.7* 8.3* 8.5* 8.6*  MG  --   2.4  --   --   --   PHOS  --  3.4  --   --   --    Liver Function Tests:  Recent Labs Lab 01/16/17 2001 01/17/17 0553  AST 22 17  ALT 13* 11*  ALKPHOS 159* 139*  BILITOT 0.7 0.7  PROT 8.0 7.3  ALBUMIN 3.5 3.1*   CBC:  Recent Labs Lab 01/16/17 1335 01/17/17 0553 01/18/17 0559  WBC 11.9* 19.1* 12.5*  NEUTROABS 10.3*  --   --   HGB 9.7* 10.8* 9.2*  HCT 29.6* 32.8* 28.5*  MCV 95.5 94.0 95.0  PLT 311 283 315    CBG:  Recent Labs Lab 01/19/17 0808 01/19/17 1158 01/19/17 1635 01/19/17 2116 01/20/17 0758  GLUCAP 151* 218* 166* 247* 205*     Signed:  Barton Dubois MD.  [facility] 01/20/2017, 11:48 AM

## 2017-01-21 ENCOUNTER — Telehealth: Payer: Self-pay | Admitting: General Practice

## 2017-01-21 NOTE — Telephone Encounter (Signed)
Patient has been dismissed and is no longer our patient.

## 2017-01-21 NOTE — Telephone Encounter (Signed)
[facility] notified.

## 2017-01-21 NOTE — Telephone Encounter (Signed)
[facility] will not be able to see pt until 4/13. Would like to know if this is ok.

## 2017-01-29 ENCOUNTER — Inpatient Hospital Stay (HOSPITAL_COMMUNITY)
Admission: EM | Admit: 2017-01-29 | Discharge: 2017-02-01 | DRG: 682 | Disposition: A | Discharge status: Home Health | Payer: Medicare HMO | Attending: Family Medicine | Admitting: Family Medicine

## 2017-01-29 ENCOUNTER — Encounter: Payer: Self-pay | Admitting: Family Medicine

## 2017-01-29 ENCOUNTER — Encounter (HOSPITAL_COMMUNITY): Payer: Self-pay | Admitting: Internal Medicine

## 2017-01-29 ENCOUNTER — Ambulatory Visit (INDEPENDENT_AMBULATORY_CARE_PROVIDER_SITE_OTHER): Payer: Medicare HMO | Admitting: Family Medicine

## 2017-01-29 ENCOUNTER — Observation Stay (HOSPITAL_COMMUNITY): Payer: Medicare HMO

## 2017-01-29 VITALS — BP 126/70 | HR 71 | Temp 98.8°F | Resp 16 | Ht 69.0 in | Wt 131.0 lb

## 2017-01-29 DIAGNOSIS — N179 Acute kidney failure, unspecified: Secondary | ICD-10-CM | POA: Diagnosis not present

## 2017-01-29 DIAGNOSIS — R0781 Pleurodynia: Secondary | ICD-10-CM

## 2017-01-29 DIAGNOSIS — E860 Dehydration: Secondary | ICD-10-CM | POA: Diagnosis present

## 2017-01-29 DIAGNOSIS — E1310 Other specified diabetes mellitus with ketoacidosis without coma: Secondary | ICD-10-CM

## 2017-01-29 DIAGNOSIS — Z79899 Other long term (current) drug therapy: Secondary | ICD-10-CM

## 2017-01-29 DIAGNOSIS — W19XXXA Unspecified fall, initial encounter: Secondary | ICD-10-CM | POA: Diagnosis present

## 2017-01-29 DIAGNOSIS — E1122 Type 2 diabetes mellitus with diabetic chronic kidney disease: Secondary | ICD-10-CM | POA: Diagnosis present

## 2017-01-29 DIAGNOSIS — E119 Type 2 diabetes mellitus without complications: Secondary | ICD-10-CM | POA: Diagnosis not present

## 2017-01-29 DIAGNOSIS — N183 Chronic kidney disease, stage 3 (moderate): Secondary | ICD-10-CM | POA: Diagnosis present

## 2017-01-29 DIAGNOSIS — E1165 Type 2 diabetes mellitus with hyperglycemia: Secondary | ICD-10-CM | POA: Diagnosis present

## 2017-01-29 DIAGNOSIS — E871 Hypo-osmolality and hyponatremia: Secondary | ICD-10-CM | POA: Diagnosis not present

## 2017-01-29 DIAGNOSIS — E1110 Type 2 diabetes mellitus with ketoacidosis without coma: Secondary | ICD-10-CM | POA: Diagnosis not present

## 2017-01-29 DIAGNOSIS — Z96641 Presence of right artificial hip joint: Secondary | ICD-10-CM | POA: Diagnosis present

## 2017-01-29 DIAGNOSIS — IMO0002 Reserved for concepts with insufficient information to code with codable children: Secondary | ICD-10-CM | POA: Diagnosis present

## 2017-01-29 DIAGNOSIS — E118 Type 2 diabetes mellitus with unspecified complications: Secondary | ICD-10-CM

## 2017-01-29 DIAGNOSIS — N185 Chronic kidney disease, stage 5: Secondary | ICD-10-CM | POA: Diagnosis present

## 2017-01-29 DIAGNOSIS — R0789 Other chest pain: Secondary | ICD-10-CM | POA: Diagnosis present

## 2017-01-29 DIAGNOSIS — Z23 Encounter for immunization: Secondary | ICD-10-CM

## 2017-01-29 DIAGNOSIS — N178 Other acute kidney failure: Secondary | ICD-10-CM | POA: Diagnosis not present

## 2017-01-29 DIAGNOSIS — Z794 Long term (current) use of insulin: Secondary | ICD-10-CM | POA: Diagnosis not present

## 2017-01-29 DIAGNOSIS — S2239XA Fracture of one rib, unspecified side, initial encounter for closed fracture: Secondary | ICD-10-CM | POA: Diagnosis present

## 2017-01-29 DIAGNOSIS — Z7982 Long term (current) use of aspirin: Secondary | ICD-10-CM

## 2017-01-29 DIAGNOSIS — I129 Hypertensive chronic kidney disease with stage 1 through stage 4 chronic kidney disease, or unspecified chronic kidney disease: Secondary | ICD-10-CM | POA: Diagnosis present

## 2017-01-29 DIAGNOSIS — Z8673 Personal history of transient ischemic attack (TIA), and cerebral infarction without residual deficits: Secondary | ICD-10-CM

## 2017-01-29 DIAGNOSIS — Z87891 Personal history of nicotine dependence: Secondary | ICD-10-CM

## 2017-01-29 LAB — BASIC METABOLIC PANEL
Anion gap: 16 — ABNORMAL HIGH (ref 5–15)
BUN: 73 mg/dL — ABNORMAL HIGH (ref 6–20)
CO2: 16 mmol/L — ABNORMAL LOW (ref 22–32)
Calcium: 9.5 mg/dL (ref 8.9–10.3)
Chloride: 96 mmol/L — ABNORMAL LOW (ref 101–111)
Creatinine, Ser: 2.04 mg/dL — ABNORMAL HIGH (ref 0.61–1.24)
GFR calc Af Amer: 36 mL/min — ABNORMAL LOW (ref >60)
GFR calc non Af Amer: 31 mL/min — ABNORMAL LOW (ref >60)
Glucose, Bld: 496 mg/dL — ABNORMAL HIGH (ref 65–99)
Potassium: 5.7 mmol/L — ABNORMAL HIGH (ref 3.5–5.1)
Sodium: 128 mmol/L — ABNORMAL LOW (ref 135–145)

## 2017-01-29 LAB — CBC WITH DIFFERENTIAL/PLATELET
Basophils Absolute: 0 10*3/uL (ref 0.0–0.1)
Basophils Relative: 0 %
Eosinophils Absolute: 0.4 10*3/uL (ref 0.0–0.7)
Eosinophils Relative: 3 %
HCT: 30.6 % — ABNORMAL LOW (ref 39.0–52.0)
Hemoglobin: 10.3 g/dL — ABNORMAL LOW (ref 13.0–17.0)
Lymphocytes Relative: 17 %
Lymphs Abs: 2.2 10*3/uL (ref 0.7–4.0)
MCH: 30.9 pg (ref 26.0–34.0)
MCHC: 33.7 g/dL (ref 30.0–36.0)
MCV: 91.9 fL (ref 78.0–100.0)
Monocytes Absolute: 0.7 10*3/uL (ref 0.1–1.0)
Monocytes Relative: 5 %
Neutro Abs: 9.5 10*3/uL — ABNORMAL HIGH (ref 1.7–7.7)
Neutrophils Relative %: 75 %
Platelets: 378 10*3/uL (ref 150–400)
RBC: 3.33 MIL/uL — ABNORMAL LOW (ref 4.22–5.81)
RDW: 13.8 % (ref 11.5–15.5)
WBC: 12.8 10*3/uL — ABNORMAL HIGH (ref 4.0–10.5)

## 2017-01-29 LAB — URINALYSIS, ROUTINE W REFLEX MICROSCOPIC
Bilirubin Urine: NEGATIVE
Glucose, UA: >=500 mg/dL — AB
Ketones, ur: 5 mg/dL — AB
Leukocytes, UA: NEGATIVE
Nitrite: NEGATIVE
Protein, ur: NEGATIVE mg/dL
Specific Gravity, Urine: 1.013 (ref 1.005–1.030)
Squamous Epithelial / LPF: NONE SEEN
pH: 5 (ref 5.0–8.0)

## 2017-01-29 LAB — CBG MONITORING, ED
Glucose-Capillary: 524 mg/dL (ref 65–99)
Glucose-Capillary: 552 mg/dL (ref 65–99)

## 2017-01-29 LAB — POCT URINALYSIS DIP (DEVICE)
Bilirubin Urine: NEGATIVE
Glucose, UA: 500 mg/dL — AB
Hgb urine dipstick: NEGATIVE
Leukocytes, UA: NEGATIVE
Nitrite: NEGATIVE
Protein, ur: NEGATIVE mg/dL
Specific Gravity, Urine: <=1.005 (ref 1.005–1.030)
Urobilinogen, UA: 0.2 mg/dL (ref 0.0–1.0)
pH: 6 (ref 5.0–8.0)

## 2017-01-29 LAB — GLUCOSE, CAPILLARY
Glucose-Capillary: >600 mg/dL (ref 65–99)
Glucose-Capillary: >600 mg/dL (ref 65–99)

## 2017-01-29 LAB — POCT GLYCOSYLATED HEMOGLOBIN (HGB A1C): Hemoglobin A1C: 10.2

## 2017-01-29 MED ORDER — CARVEDILOL 6.25 MG PO TABS: 6.2500 mg | ORAL_TABLET | Freq: Two times a day (BID) | ORAL | 1 refills | Status: DC

## 2017-01-29 MED ORDER — INSULIN ASPART 100 UNIT/ML ~~LOC~~ SOLN: 0.0000 [IU] | Freq: Three times a day (TID) | SUBCUTANEOUS | Status: DC

## 2017-01-29 MED ORDER — ASPIRIN EC 81 MG PO TBEC
81.0000 mg | DELAYED_RELEASE_TABLET | Freq: Every day | ORAL | Status: DC
  Administered 2017-01-30 – 2017-02-01 (×3): 81 mg via ORAL
  Filled 2017-01-29 (×3): qty 1

## 2017-01-29 MED ORDER — SODIUM CHLORIDE 0.9 % IV SOLN: INTRAVENOUS | Status: DC

## 2017-01-29 MED ORDER — ENOXAPARIN SODIUM 30 MG/0.3ML ~~LOC~~ SOLN
30.0000 mg | Freq: Every day | SUBCUTANEOUS | Status: DC
  Administered 2017-01-29: 30 mg via SUBCUTANEOUS
  Filled 2017-01-29: qty 0.3

## 2017-01-29 MED ORDER — INSULIN ASPART 100 UNIT/ML FLEXPEN: PEN_INJECTOR | SUBCUTANEOUS | 2 refills | Status: DC

## 2017-01-29 MED ORDER — SODIUM CHLORIDE 0.9 % IV SOLN
INTRAVENOUS | Status: DC
  Filled 2017-01-29: qty 2.5

## 2017-01-29 MED ORDER — ASPIRIN 81 MG PO TBEC: 81.0000 mg | DELAYED_RELEASE_TABLET | Freq: Every day | ORAL | 3 refills | Status: DC

## 2017-01-29 MED ORDER — AMLODIPINE BESYLATE 10 MG PO TABS
10.0000 mg | ORAL_TABLET | Freq: Every day | ORAL | Status: DC
Start: 2017-01-30 — End: 2017-02-01
  Administered 2017-01-30 – 2017-02-01 (×3): 10 mg via ORAL
  Filled 2017-01-29 (×3): qty 1

## 2017-01-29 MED ORDER — SODIUM CHLORIDE 0.9 % IV BOLUS (SEPSIS): 500.0000 mL | Freq: Once | INTRAVENOUS | Status: DC

## 2017-01-29 MED ORDER — SODIUM CHLORIDE 0.9 % IV SOLN
1000.0000 mL | Freq: Once | INTRAVENOUS | Status: AC
  Administered 2017-01-29: 1000 mL via INTRAVENOUS

## 2017-01-29 MED ORDER — INSULIN ASPART 100 UNIT/ML ~~LOC~~ SOLN
25.0000 [IU] | SUBCUTANEOUS | Status: AC
  Administered 2017-01-29: 25 [IU] via SUBCUTANEOUS

## 2017-01-29 MED ORDER — LACTATED RINGERS IV SOLN
INTRAVENOUS | Status: DC
  Administered 2017-01-29: via INTRAVENOUS

## 2017-01-29 MED ORDER — ONDANSETRON HCL 4 MG PO TABS: 4.0000 mg | ORAL_TABLET | Freq: Four times a day (QID) | ORAL | Status: DC | PRN

## 2017-01-29 MED ORDER — SODIUM CHLORIDE 0.9 % IV BOLUS (SEPSIS)
1000.0000 mL | Freq: Once | INTRAVENOUS | Status: AC
  Administered 2017-01-29: 1000 mL via INTRAVENOUS

## 2017-01-29 MED ORDER — ACETAMINOPHEN 325 MG PO TABS
650.0000 mg | ORAL_TABLET | Freq: Four times a day (QID) | ORAL | Status: DC | PRN
  Administered 2017-01-29 – 2017-01-30 (×2): 650 mg via ORAL
  Filled 2017-01-29 (×2): qty 2

## 2017-01-29 MED ORDER — FLUOXETINE HCL 40 MG PO CAPS: 40.0000 mg | ORAL_CAPSULE | Freq: Every day | ORAL | 3 refills | Status: DC

## 2017-01-29 MED ORDER — SODIUM CHLORIDE 0.9 % IV SOLN: 1000.0000 mL | INTRAVENOUS | Status: DC

## 2017-01-29 MED ORDER — INSULIN DETEMIR 100 UNIT/ML ~~LOC~~ SOLN
20.0000 [IU] | Freq: Every day | SUBCUTANEOUS | Status: DC
  Administered 2017-01-29: 20 [IU] via SUBCUTANEOUS
  Filled 2017-01-29: qty 0.2

## 2017-01-29 MED ORDER — SODIUM CHLORIDE 0.9 % IV SOLN: 1000.0000 mL | Freq: Once | INTRAVENOUS | Status: DC

## 2017-01-29 MED ORDER — ACETAMINOPHEN 650 MG RE SUPP: 650.0000 mg | Freq: Four times a day (QID) | RECTAL | Status: DC | PRN

## 2017-01-29 MED ORDER — INSULIN ASPART 100 UNIT/ML ~~LOC~~ SOLN: 0.0000 [IU] | Freq: Every day | SUBCUTANEOUS | Status: DC

## 2017-01-29 MED ORDER — CARVEDILOL 6.25 MG PO TABS
6.2500 mg | ORAL_TABLET | Freq: Two times a day (BID) | ORAL | Status: DC
  Administered 2017-01-30 – 2017-02-01 (×5): 6.25 mg via ORAL
  Filled 2017-01-29 (×5): qty 1

## 2017-01-29 MED ORDER — INSULIN REGULAR BOLUS VIA INFUSION
0.0000 [IU] | Freq: Three times a day (TID) | INTRAVENOUS | Status: DC
  Filled 2017-01-29: qty 10

## 2017-01-29 MED ORDER — DEXTROSE 50 % IV SOLN
25.0000 mL | INTRAVENOUS | Status: DC | PRN
  Administered 2017-01-30: 16 mL via INTRAVENOUS
  Administered 2017-01-30: 14 mL via INTRAVENOUS
  Administered 2017-01-30: 15 mL via INTRAVENOUS
  Filled 2017-01-29: qty 50

## 2017-01-29 MED ORDER — AMLODIPINE BESYLATE 10 MG PO TABS: 10.0000 mg | ORAL_TABLET | Freq: Every day | ORAL | 3 refills | Status: DC

## 2017-01-29 MED ORDER — ONDANSETRON HCL 4 MG/2ML IJ SOLN: 4.0000 mg | Freq: Four times a day (QID) | INTRAMUSCULAR | Status: DC | PRN

## 2017-01-29 MED ORDER — DEXTROSE-NACL 5-0.45 % IV SOLN: INTRAVENOUS | Status: DC

## 2017-01-29 MED ORDER — FLUOXETINE HCL 20 MG PO CAPS
40.0000 mg | ORAL_CAPSULE | Freq: Every day | ORAL | Status: DC
  Administered 2017-01-30 – 2017-02-01 (×3): 40 mg via ORAL
  Filled 2017-01-29 (×3): qty 2

## 2017-01-29 MED ORDER — INSULIN ASPART 100 UNIT/ML ~~LOC~~ SOLN: 3.0000 [IU] | Freq: Three times a day (TID) | SUBCUTANEOUS | Status: DC

## 2017-01-29 NOTE — ED Notes (Signed)
Charge RN attempted IV. Was able to get blood but IV was unsuccessful.

## 2017-01-29 NOTE — ED Triage Notes (Signed)
Pt brought by caretaker for unreadable high blood glucose. Pt asymptomatic. Caregiver administered 15 units Novolog around 0930.

## 2017-01-29 NOTE — ED Notes (Signed)
Dr. Oleta Mouse placed IV in EJ.  A few mins later, patient accidentally pulled it out. MD made aware.

## 2017-01-29 NOTE — ED Notes (Signed)
RN attempted IV x 2 unsuccessfully in left and right AC. Will have another RN attempt.

## 2017-01-29 NOTE — Progress Notes (Signed)
Patient ID: [name] [name], male    DOB: 1946-05-16, 71 y.o.   MRN: [id number]  PCP: [name], [name]  Chief Complaint  Patient presents with  . Establish Care    Subjective:  HPI [name] [name] is a 71 y.o. male presents for hospital follow-up and to establish care.  Medical problems include: Uncontrolled T2DM, CKD, hx CVA, Depression, and Anxiety  [name] [name] was recently hospitalized at [facility] from 11/13/2246-2/50/0370 for a complicated UTI and uncontrolled T2DM. He presents newly to practice today with his daughter accompanying him. [name] [name] daughter reports that patient  lives alone and is unable to administer his insulin. She reports he forgets and she doesn't trust that he is able to safely administer his medications. She typically goes by and checks on him before and after work and administer his medication. This morning when she checked his glucose, the glucometer was unreadable. She administer 15 units of short acting insulin prior to arriving in the office.  Daughter reports that if his blood sugar [place] in low in the "200's" she holds his night-time basal insulin. She was confused and thought though that administering the Lantus would cause his blood sugar to drop at night. Daughter report that she feel like the Prozac is making him depressed and she has stopped administering the medication. Mr. Staup confirms that he has a very poor appetite and persistent weight loss without efforts. Denies any recent chest pain or shortness or breath. He is dependent on a walker for mobility since he had hip surgery. Post surgery he experienced multiple falls and has been unable to ambulate without his mobility aid.   Social History   Social History  . Marital status: Single    Spouse name: N/A  . Number of children: 2  . Years of education: 13   Occupational History  . Retired    Social History Main Topics  . Smoking status: Former Smoker    Packs/day: 0.25    Years: 30.00   Types: Cigarettes    Quit date: 01/31/2013  . Smokeless tobacco: Never Used  . Alcohol use No  . Drug use: No  . Sexual activity: No   Other Topics Concern  . Not on file   Social History Narrative   Fun: Likes to do handyman related projects, photography.    Denies religious beliefs effecting health care.     Family History  Problem Relation Age of Onset  . Diabetes Mother   . Alzheimer's disease Mother   . Hypertension Mother   . Hyperlipidemia Father   . Hypertension Father   . Healthy Maternal Grandmother   . Pneumonia Maternal Grandfather    Review of Systems See HPI  Patient Active Problem List   Diagnosis Date Noted  . Dehydration   . Urinary tract infection without hematuria   . Hyperkalemia 01/16/2017  . Acute metabolic encephalopathy 48/88/9169  . Elevated lactic acid level   . DKA (diabetic ketoacidoses) ([place]) 12/28/2016  . AKI (acute kidney injury) ([place]) 12/14/2016  . Hypoglycemia   . Sepsis ([place]) 12/13/2016  . Uncontrolled type 2 diabetes mellitus with complication ([place])   . Encephalopathy, metabolic   . Acute renal failure superimposed on stage 3 chronic kidney disease ([place])   . Noncompliance with medication regimen 08/17/2016  . History of DVT (deep vein thrombosis) 07/06/2016  . Retroperitoneal hematoma 06/18/2016  . Vitamin B 12 deficiency 06/18/2016  . Hyperglycemia 05/15/2016  . Anxiety 05/15/2016  . History of CVA (cerebrovascular accident)  05/15/2016  . Hypoglycemia, coma ([place]) 11/02/2015  . Narcotic dependency, continuous ([place]) 11/02/2015  . Uncontrolled type 2 diabetes mellitus with diabetic nephropathy, with long-term current use of insulin ([place]) 08/12/2015  . Depression 08/12/2015  . CKD (chronic kidney disease) stage 3, GFR 30-59 ml/min 03/20/2014    No Known Allergies  Prior to Admission medications   Medication Sig Start Date End Date Taking? Authorizing Provider  acetaminophen (TYLENOL) 325 MG tablet Take 2 tablets (650 mg total)  by mouth every 6 (six) hours as needed for mild pain (or Fever >/= 101). 01/20/17  Yes Barton Dubois, MD  amLODipine (NORVASC) 10 MG tablet Take 1 tablet (10 mg total) by mouth daily. 01/20/17  Yes Barton Dubois, MD  aspirin EC 81 MG EC tablet Take 1 tablet (81 mg total) by mouth daily. 11/20/16  Yes Shanker Kristeen Mans, MD  blood glucose meter kit and supplies Dispense based on patient and insurance preference. Use up to four times daily as directed. (FOR ICD-9 250.00, 250.01). 01/20/17  Yes Barton Dubois, MD  carvedilol (COREG) 6.25 MG tablet Take 1 tablet (6.25 mg total) by mouth 2 (two) times daily with a meal. 01/20/17  Yes Barton Dubois, MD  FLUoxetine (PROZAC) 20 MG capsule Take 1 capsule (20 mg total) by mouth daily. 01/20/17  Yes Barton Dubois, MD  insulin aspart (NOVOLOG FLEXPEN) 100 UNIT/ML FlexPen 0-15 Units, Subcutaneous, 3 times daily with meals CBG < 70: implement hypoglycemia protocol CBG 70 - 120: 0 units CBG 121 - 150: 2 units CBG 151 - 200: 3 units CBG 201 - 250: 5 units CBG 251 - 300: 8 units CBG 301 - 350: 11 units CBG 351 - 400: 15 units CBG > 400: call MD 01/20/17  Yes Barton Dubois, MD  Insulin Detemir (LEVEMIR) 100 UNIT/ML Pen Inject 20 Units into the skin daily at 10 pm. 01/20/17  Yes Barton Dubois, MD  Insulin Pen Needle 31G X 5 MM MISC Use as needed to inject insulin as prescribed 01/20/17  Yes Barton Dubois, MD  vitamin B-12 (CYANOCOBALAMIN) 1000 MCG tablet Take 1 tablet (1,000 mcg total) by mouth daily. 01/20/17  Yes Barton Dubois, MD    Past Medical, Surgical Family and Social History reviewed and updated.    Objective:   Today's Vitals   01/29/17 1026  BP: 126/70  Pulse: 71  Resp: 16  Temp: 98.8 F (37.1 C)  TempSrc: Oral  SpO2: 96%  Weight: 131 lb (59.4 kg)  Height: '5\' 9"'  (1.753 m)    Wt Readings from Last 3 Encounters:  01/29/17 131 lb (59.4 kg)  01/16/17 127 lb (57.6 kg)  01/02/17 130 lb (59 kg)   Physical Exam  Omitted-patient was referred to the  Emergency department. Checked blood sugar and A1C both were un measurable by our POCT devices. Patient is very weak and unstable. He had to be transferred to a wheelchair due to generalized weakness and unsteady gait.   Assessment & Plan:  Patient was escorted by [place] downstairs with daughter to the [facility] Emergency Department Faxed over a letter advising his daughter's employer requesting that she be excused from work today.  RTC: Schedule an appointment within 1 week of discharge from the hospital   [place]. Kenton Kingfisher, MSN, [facility] [address] [place],  [id number] [phone]

## 2017-01-29 NOTE — Progress Notes (Signed)
Spoke to MD about paient being appropriate for the floor. Baltazar Najjar stated that she concurred w/ Dr. Lorin Mercy in that they believe the anion gap 16 & potassium 5.7 is more due to the acute kidney injury. Both providers stated that these labs are chronically high for this patient and his normal blood glucose stays around 400. They also stated that the ketones are low which helps conclude that patient is not in DKA. Baltazar Najjar stated that if values did not look better after 4 hours after administering novolog & levemir and LR at 150, then patient would be transferred off unit.

## 2017-01-29 NOTE — ED Provider Notes (Signed)
Medical screening examination/treatment/procedure(s) were conducted as a shared visit with non-physician practitioner(s) and myself.  I personally evaluated the patient during the encounter.   EKG Interpretation None      71 year old male with hyperglycemia. History of DM and prior CVA. Brought in by caretaker for high blood glucose at home, unable to register on his glucometer. Reports compliance with medications. Denies weakness, fatigue, fever, chills, dysuria, chest pain, dyspnea, cough. Does think he may have vomiting.  Very dry on exam but stable vitals. Very difficult to get IV. Blood work concerning for DKA, with AG 16, bicarbonate of 16, and hyperglycemia 400s. Care has been delayed due to difficulty obtaining IV access. Multiple attempts at bedside by patient's nurse, IV team, and myself. EJ placed, but patient accidentally pulled this out while he was sitting up to urinate. Right upper extremity IV placed with IV fluids to be administered. Will start insulin afterwards and plan for admission.  Angiocath insertion Performed by: Forde Dandy  Consent: Verbal consent obtained. Risks and benefits: risks, benefits and alternatives were discussed Time out: Immediately prior to procedure a "time out" was called to verify the correct patient, procedure, equipment, support staff and site/side marked as required.  Preparation: Patient was prepped and draped in the usual sterile fashion.  Vein Location: left EJ  Ultrasound Guided  Gauge: 20G  Normal blood return and flush without difficulty Patient tolerance: Patient tolerated the procedure well with no immediate complications.  Angiocath insertion Performed by: Forde Dandy  Consent: Verbal consent obtained. Risks and benefits: risks, benefits and alternatives were discussed Time out: Immediately prior to procedure a "time out" was called to verify the correct patient, procedure, equipment, support staff and site/side marked as  required.  Preparation: Patient was prepped and draped in the usual sterile fashion.  Vein Location: right upper arm  Ultrasound Guided  Gauge: 20G  Normal blood return and flush without difficulty Patient tolerance: Patient tolerated the procedure well with no immediate complications.      CRITICAL CARE Performed by: Forde Dandy   Total critical care time: 35 minutes  Critical care time was exclusive of separately billable procedures and treating other patients.  Critical care was necessary to treat or prevent imminent or life-threatening deterioration.  Critical care was time spent personally by me on the following activities: development of treatment plan with patient and/or surrogate as well as nursing, discussions with consultants, evaluation of patient's response to treatment, examination of patient, obtaining history from patient or surrogate, ordering and performing treatments and interventions, ordering and review of laboratory studies, ordering and review of radiographic studies, pulse oximetry and re-evaluation of patient's condition.    Forde Dandy, MD 01/29/17 2032

## 2017-01-29 NOTE — ED Notes (Signed)
Dr. Oleta Mouse placed [place] IV in right upper bicep.

## 2017-01-29 NOTE — ED Provider Notes (Signed)
[facility] DEPT Provider Note   CSN: [id number] Arrival date & time: 01/29/17  1145     History   Chief Complaint Chief Complaint  Patient presents with  . Hyperglycemia    HPI [name] [name] is a 71 y.o. male.  The history is provided by the patient. No language interpreter was used.  Hyperglycemia  Blood sugar level PTA:  Over 600 Severity:  Moderate Onset quality:  Gradual Duration:  1 day Timing:  Constant Progression:  Worsening Chronicity:  New Diabetes status:  Controlled with insulin Time since last antidiabetic medication:  3 hours Context: noncompliance   Ineffective treatments:  None tried Associated symptoms: nausea and polyuria   Risk factors: hx of DKA   Pt complains of elevated glucose.  Pt admits he may have missed insulin dosages.  Pt reports he lives alone.  Family checked on him today and glucose was high  Past Medical History:  Diagnosis Date  . 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 Closed comminuted intertrochanteric fracture of left femur ([place])   . Diabetes mellitus without complication ([place])   . Diabetic hyperosmolar non-ketotic state ([place]) 05/15/2016  . DKA (diabetic ketoacidoses) ([place]) 05/09/2016  . Hypertension   . Postoperative anemia due to acute blood loss 06/18/2016  . Retroperitoneal hematoma 06/18/2016  . Stroke ([place])   . Vitamin B 12 deficiency 06/18/2016    Patient Active Problem List   Diagnosis Date Noted  . Dehydration   . Urinary tract infection without hematuria   . Hyperkalemia 01/16/2017  . Acute metabolic encephalopathy 51/11/5850  . Elevated lactic acid level   . DKA (diabetic ketoacidoses) ([place]) 12/28/2016  . AKI (acute kidney injury) ([place]) 12/14/2016  . Hypoglycemia   . Sepsis ([place]) 12/13/2016  . Uncontrolled type 2 diabetes mellitus with complication ([place])   . Encephalopathy, metabolic   . Acute renal failure superimposed on stage 3 chronic kidney disease ([place])     . Noncompliance with medication regimen 08/17/2016  . History of DVT (deep vein thrombosis) 07/06/2016  . Retroperitoneal hematoma 06/18/2016  . Vitamin B 12 deficiency 06/18/2016  . Hyperglycemia 05/15/2016  . Anxiety 05/15/2016  . History of CVA (cerebrovascular accident) 05/15/2016  . Hypoglycemia, coma ([place]) 11/02/2015  . Narcotic dependency, continuous ([place]) 11/02/2015  . Uncontrolled type 2 diabetes mellitus with diabetic nephropathy, with long-term current use of insulin ([place]) 08/12/2015  . Depression 08/12/2015  . CKD (chronic kidney disease) stage 3, GFR 30-59 ml/min 03/20/2014    Past Surgical History:  Procedure Laterality Date  . HIP ARTHROPLASTY Right 02/05/2013   Procedure: ARTHROPLASTY BIPOLAR HIP;  Surgeon: [name], MD;  Location: [facility] ORS;  Service: Orthopedics;  Laterality: Right;  . INTRAMEDULLARY (IM) NAIL INTERTROCHANTERIC Left 06/16/2016   Procedure: INTRAMEDULLARY (IM) NAIL INTERTROCHANTRIC;  Surgeon: Rod Can, MD;  Location: [facility];  Service: Orthopedics;  Laterality: Left;       Home Medications    Prior to Admission medications   Medication Sig Start Date End Date Taking? Authorizing Provider  acetaminophen (TYLENOL) 325 MG tablet Take 2 tablets (650 mg total) by mouth every 6 (six) hours as needed for mild pain (or Fever >/= 101). 01/20/17  Yes Barton Dubois, MD  amLODipine (NORVASC) 10 MG tablet Take 1 tablet (10 mg total) by mouth daily. 01/29/17  Yes [place] Muta, [name]  aspirin 81 MG EC tablet Take 1 tablet (81 mg total) by mouth daily. 01/29/17  Yes [place] Muta, [place]  carvedilol (COREG) 6.25 MG tablet Take 1 tablet (6.25 mg total) by mouth 2 (two) times daily with a meal. 01/29/17  Yes [place] Muta, [name]  FLUoxetine (PROZAC) 40 MG capsule Take 1 capsule (40 mg total) by mouth daily. 01/29/17  Yes [place] Muta, [name]  insulin aspart (NOVOLOG FLEXPEN) 100 UNIT/ML FlexPen 0-15 Units, Subcutaneous, 3  times daily with meals CBG < 70: implement hypoglycemia protocol CBG 70 - 120: 0 units CBG 121 - 150: 2 units CBG 151 - 200: 3 units CBG 201 - 250: 5 units CBG 251 - 300: 8 units CBG 301 - 350: 11 units CBG 351 - 400: 15 units CBG > 400: call MD 01/29/17  Yes [place] Muta, [name]  Insulin Detemir (LEVEMIR) 100 UNIT/ML Pen Inject 20 Units into the skin daily at 10 pm. 01/20/17  Yes Barton Dubois, MD  vitamin B-12 (CYANOCOBALAMIN) 1000 MCG tablet Take 1 tablet (1,000 mcg total) by mouth daily. 01/20/17  Yes Barton Dubois, MD  blood glucose meter kit and supplies Dispense based on patient and insurance preference. Use up to four times daily as directed. (FOR ICD-9 250.00, 250.01). 01/20/17   Barton Dubois, MD  Insulin Pen Needle 31G X 5 MM MISC Use as needed to inject insulin as prescribed 01/20/17   Barton Dubois, MD    Family History Family History  Problem Relation Age of Onset  . Diabetes Mother   . Alzheimer's disease Mother   . Hypertension Mother   . Hyperlipidemia Father   . Hypertension Father   . Healthy Maternal Grandmother   . Pneumonia Maternal Grandfather     Social History Social History  Substance Use Topics  . Smoking status: Former Smoker    Packs/day: 0.25    Years: 30.00    Types: Cigarettes    Quit date: 01/31/2013  . Smokeless tobacco: Never Used  . Alcohol use No     Allergies   Patient has no known allergies.   Review of Systems Review of Systems  Gastrointestinal: Positive for nausea.  Endocrine: Positive for polyuria.  All other systems reviewed and are negative.    Physical Exam Updated Vital Signs BP 136/74 (BP Location: Left Arm)   Pulse 62   Temp 97.6 F (36.4 C) (Oral)   Resp 16   SpO2 97%   Physical Exam  Constitutional: He is oriented to person, place, and time. He appears well-developed and well-nourished.  HENT:  Head: Normocephalic.  Right Ear: External ear normal.  Left Ear: External ear normal.  Nose: Nose  normal.  Mouth/Throat: Oropharynx is clear and moist.  Eyes: Conjunctivae and EOM are normal. Pupils are equal, round, and reactive to light.  Neck: Normal range of motion.  Cardiovascular: Normal rate.   Pulmonary/Chest: Effort normal.  Abdominal: Soft.  Musculoskeletal: Normal range of motion.  Neurological: He is alert and oriented to person, place, and time.  Skin: Skin is warm.  Psychiatric: He has a normal mood and affect.     ED Treatments / Results  Labs (all labs ordered are listed, but only abnormal results are displayed) Labs Reviewed  BASIC METABOLIC PANEL - Abnormal; Notable for the following:       Result Value   Sodium 128 (*)    Potassium 5.7 (*)    Chloride 96 (*)    CO2 16 (*)    Glucose, Bld 496 (*)    BUN 73 (*)    Creatinine, Ser 2.04 (*)    GFR calc  non Af Amer 31 (*)    GFR calc Af Amer 36 (*)    Anion gap 16 (*)    All other components within normal limits  CBC WITH DIFFERENTIAL/PLATELET - Abnormal; Notable for the following:    WBC 12.8 (*)    RBC 3.33 (*)    Hemoglobin 10.3 (*)    HCT 30.6 (*)    Neutro Abs 9.5 (*)    All other components within normal limits  CBG MONITORING, ED - Abnormal; Notable for the following:    Glucose-Capillary 524 (*)    All other components within normal limits  URINALYSIS, ROUTINE W REFLEX MICROSCOPIC    EKG  EKG Interpretation None       Radiology No results found.  Procedures Procedures (including critical care time)  Medications Ordered in ED Medications  0.9 %  sodium chloride infusion (1,000 mLs Intravenous New Bag/Given 01/29/17 1710)    Followed by  0.9 %  sodium chloride infusion (not administered)    Followed by  0.9 %  sodium chloride infusion (not administered)  sodium chloride 0.9 % bolus 1,000 mL (1,000 mLs Intravenous New Bag/Given 01/29/17 1709)     Initial Impression / Assessment and Plan / ED Course  I have reviewed the triage vital signs and the nursing notes.  Pertinent labs  & imaging results that were available during my care of the patient were reviewed by me and considered in my medical decision making (see chart for details).       Final Clinical Impressions(s) / ED Diagnoses   Final diagnoses:  Diabetic ketoacidosis without coma associated with other specified diabetes mellitus ([place])  AKI (acute kidney injury) ([place])  Hyponatremia    New Prescriptions New Prescriptions   No medications on file   I spoke with Dr. Lorin Mercy who will admit pt.    [place], PA-C 01/29/17 2109    Forde Dandy, MD 01/30/17 [phone]

## 2017-01-29 NOTE — ED Notes (Signed)
Another RN attempted IV x 2. Unsuccessful. Dr. Lorin Mercy being made aware.

## 2017-01-29 NOTE — H&P (Addendum)
History and Physical    [name] [name] YKZ:[id number] DOB: 1946/05/07 DOA: 01/29/2017  PCP: [name], [name] Consultants:  None Patient coming from: home - lives alone; [place]: daughter, [phone], [phone]  Chief Complaint: n/v/abd pain  HPI: [name] [name] is a 71 y.o. male with medical history significant of CVA, HTN, DM - last A1c 10+ 7 days ago presenting with n/v/abdominal pain.  He reports "My daughter brought me to the ER.  She said I was throwing up and I was complaining about hurting in the stomach".  Still with some abdominal pain, right under the ribcage on the left.  He fell yesterday and thinks that is the source of his pain.  Last emesis was this afternoon while still at home.  Sugars "so far they're doing pretty good".  However, the machine said it was "high" when he came in, unsure why.  There is a note from his PCP today; he was establishing care.  She reports that his "daughter did not give him Levemir as she was afraid it would drop his blood pressure."  He was previously hospitalized from 4/6-10 for acute encephalopathy and sepsis due to a Klebsiella UTI; and from 3/18-20 for DKA (CO2 on that admission was 10 and glucose was 820, with creatinine 4.18).  He was hospitalized from 3/3-7 for hypoglycemia due to accidental insulin overdose and from 2/4-8/18 and 12/20-26/17 for DKA .   ED Course: Concern for DKA but unable to hydrate patient or start insulin due to recurrent loss of IV access  Review of Systems: As per HPI; otherwise review of systems reviewed and negative.   Ambulatory Status:  Ambulates without assistance  Past Medical History:  Diagnosis Date  . Cerebral infarction due to thrombosis of right posterior cerebral artery ([place]) 06/08/2015  . Closed comminuted intertrochanteric fracture of left femur ([place])   . Diabetes mellitus without complication ([place])   . Diabetic hyperosmolar non-ketotic state ([place]) 05/15/2016  . DKA (diabetic ketoacidoses) ([place]) 05/09/2016    . Hypertension   . Postoperative anemia due to acute blood loss 06/18/2016  . Retroperitoneal hematoma 06/18/2016  . Stroke ([place])   . Vitamin B 12 deficiency 06/18/2016    Past Surgical History:  Procedure Laterality Date  . HIP ARTHROPLASTY Right 02/05/2013   Procedure: ARTHROPLASTY BIPOLAR HIP;  Surgeon: [name], MD;  Location: [facility];  Service: Orthopedics;  Laterality: Right;  . INTRAMEDULLARY (IM) NAIL INTERTROCHANTERIC Left 06/16/2016   Procedure: INTRAMEDULLARY (IM) NAIL INTERTROCHANTRIC;  Surgeon: Rod Can, MD;  Location: [facility];  Service: Orthopedics;  Laterality: Left;    Social History   Social History  . Marital status: Single    Spouse name: [name] [name]  . Number of children: 2  . Years of education: 13   Occupational History  . Retired    Social History Main Topics  . Smoking status: Former Smoker    Packs/day: 0.25    Years: 30.00    Types: Cigarettes    Quit date: 01/31/2013  . Smokeless tobacco: Never Used  . Alcohol use No  . Drug use: No  . Sexual activity: No   Other Topics Concern  . Not on file   Social History Narrative   Fun: Likes to do handyman related projects, photography.    Denies religious beliefs effecting health care.     No Known Allergies  Family History  Problem Relation Age of Onset  . Diabetes Mother   . Alzheimer's disease Mother   . Hypertension Mother   .  Hyperlipidemia Father   . Hypertension Father   . Healthy Maternal Grandmother   . Pneumonia Maternal Grandfather     Prior to Admission medications   Medication Sig Start Date End Date Taking? Authorizing Provider  acetaminophen (TYLENOL) 325 MG tablet Take 2 tablets (650 mg total) by mouth every 6 (six) hours as needed for mild pain (or Fever >/= 101). 01/20/17  Yes Barton Dubois, MD  amLODipine (NORVASC) 10 MG tablet Take 1 tablet (10 mg total) by mouth daily. 01/29/17  Yes Sedalia Muta, [name]  aspirin 81 MG EC tablet Take 1 tablet (81 mg total) by mouth  daily. 01/29/17  Yes Sedalia Muta, [name]  carvedilol (COREG) 6.25 MG tablet Take 1 tablet (6.25 mg total) by mouth 2 (two) times daily with a meal. 01/29/17  Yes Sedalia Muta, [name]  FLUoxetine (PROZAC) 40 MG capsule Take 1 capsule (40 mg total) by mouth daily. 01/29/17  Yes Sedalia Muta, [name]  insulin aspart (NOVOLOG FLEXPEN) 100 UNIT/ML FlexPen 0-15 Units, Subcutaneous, 3 times daily with meals CBG < 70: implement hypoglycemia protocol CBG 70 - 120: 0 units CBG 121 - 150: 2 units CBG 151 - 200: 3 units CBG 201 - 250: 5 units CBG 251 - 300: 8 units CBG 301 - 350: 11 units CBG 351 - 400: 15 units CBG > 400: call MD 01/29/17  Yes Sedalia Muta, [name]  Insulin Detemir (LEVEMIR) 100 UNIT/ML Pen Inject 20 Units into the skin daily at 10 pm. 01/20/17  Yes Barton Dubois, MD  vitamin B-12 (CYANOCOBALAMIN) 1000 MCG tablet Take 1 tablet (1,000 mcg total) by mouth daily. 01/20/17  Yes Barton Dubois, MD  blood glucose meter kit and supplies Dispense based on patient and insurance preference. Use up to four times daily as directed. (FOR ICD-9 250.00, 250.01). 01/20/17   Barton Dubois, MD  Insulin Pen Needle 31G X 5 MM MISC Use as needed to inject insulin as prescribed 01/20/17   Barton Dubois, MD    Physical Exam: Vitals:   01/29/17 1528 01/29/17 1810 01/29/17 2147 01/29/17 2221  BP: 136/74 (!) 157/71 (!) 165/76 (!) 163/69  Pulse: 62 61 71 75  Resp: _0 Temp:    97.6 F (36.4 C)  TempSrc:    Oral  SpO2: 97% 98% 100% 100%     General: Appears calm and comfortable and is NAD Eyes:  PERRL, EOMI, normal lids, iris ENT:  grossly normal hearing, lips & tongue, mmm Neck:  no LAD, masses or thyromegaly Cardiovascular:  RRR, no m/r/g. No LE edema.  Respiratory:  CTA bilaterally, no w/r/r. Normal respiratory effort. Abdomen:  soft, ntnd, NABS Skin:  no rash or induration seen on limited exam Musculoskeletal:  grossly normal tone BUE/BLE, good ROM, no  bony abnormality.  Some tenderness along left anterior lower ribs without obvious deformity or ecchymoses. Psychiatric:  grossly normal mood and affect, speech fluent and appropriate, AOx3 Neurologic:  CN 2-12 grossly intact, moves all extremities in coordinated fashion, sensation intact  Labs on Admission: I have personally reviewed following labs and imaging studies  CBC:  Recent Labs Lab 01/29/17 1539  WBC 12.8*  NEUTROABS 9.5*  HGB 10.3*  HCT 30.6*  MCV 91.9  PLT 619   Basic Metabolic Panel:  Recent Labs Lab 01/29/17 1320  NA 128*  K 5.7*  CL 96*  CO2 16*  GLUCOSE 496*  BUN 73*  CREATININE 2.04*  CALCIUM 9.5   GFR: Estimated Creatinine Clearance: 27.9  mL/min (A) (by C-G formula based on SCr of 2.04 mg/dL (H)). Liver Function Tests: No results for input(s): AST, ALT, ALKPHOS, BILITOT, PROT, ALBUMIN in the last 168 hours. No results for input(s): LIPASE, AMYLASE in the last 168 hours. No results for input(s): AMMONIA in the last 168 hours. Coagulation Profile: No results for input(s): INR, PROTIME in the last 168 hours. Cardiac Enzymes: No results for input(s): CKTOTAL, CKMB, CKMBINDEX, TROPONINI in the last 168 hours. BNP (last 3 results) No results for input(s): PROBNP in the last 8760 hours. HbA1C:  Recent Labs  01/29/17 1150  HGBA1C 10.2   CBG:  Recent Labs Lab 01/29/17 1129 01/29/17 1153 01/29/17 2110 01/29/17 2250  GLUCAP >600* 524* 552* >600*   Lipid Profile: No results for input(s): CHOL, HDL, LDLCALC, TRIG, CHOLHDL, LDLDIRECT in the last 72 hours. Thyroid Function Tests: No results for input(s): TSH, T4TOTAL, FREET4, T3FREE, THYROIDAB in the last 72 hours. Anemia Panel: No results for input(s): VITAMINB12, FOLATE, FERRITIN, TIBC, IRON, RETICCTPCT in the last 72 hours. Urine analysis:    Component Value Date/Time   COLORURINE COLORLESS (A) 01/29/2017 1159   APPEARANCEUR CLEAR 01/29/2017 1159   LABSPEC 1.013 01/29/2017 1159   PHURINE  5.0 01/29/2017 1159   GLUCOSEU >=500 (A) 01/29/2017 1159   HGBUR SMALL (A) 01/29/2017 1159   BILIRUBINUR NEGATIVE 01/29/2017 1159   KETONESUR 5 (A) 01/29/2017 1159   PROTEINUR NEGATIVE 01/29/2017 1159   UROBILINOGEN 0.2 01/29/2017 1041   NITRITE NEGATIVE 01/29/2017 1159   LEUKOCYTESUR NEGATIVE 01/29/2017 1159    Creatinine Clearance: Estimated Creatinine Clearance: 27.9 mL/min (A) (by C-G formula based on SCr of 2.04 mg/dL (H)).  Sepsis Labs: _0 (procalcitonin:4,lacticidven:4) )No results found for this or any previous visit (from the past 240 hour(s)).   Radiological Exams on Admission: Dg Chest 2 View  Result Date: 01/29/2017 CLINICAL DATA:  Left rib pain. No known injury. History of diabetes and hypertension. EXAM: CHEST  2 VIEW COMPARISON:  12/28/2016 FINDINGS: Mild irregularity of the posterior left eleventh rib. In the setting of rib pain, this could indicate a fracture. Shallow inspiration. Normal heart size and pulmonary vascularity. No focal airspace disease or consolidation in the lungs. No blunting of costophrenic angles. No pneumothorax. Mild wedge deformities of mid thoracic vertebrae without change since 07/05/2016 IMPRESSION: Possible left posterior eleventh rib fracture. No evidence of active pulmonary disease. Electronically Signed   By: Lucienne Capers M.D.   On: 01/29/2017 21:48    EKG: not done  Assessment/Plan Principal Problem:   Acute renal failure superimposed on stage 3 chronic kidney disease ([name]) Active Problems:   Uncontrolled type 2 diabetes mellitus with complication ([name])   Dehydration   Rib pain on left side   Acute renal failure -Creatinine 2.04; 1.09 on 4/10 -This is likely due to dehydration in the setting of chronically uncontrolled DM -Patient appears to need aggressive rehydration and yet has had IV access difficulties and has not received sufficient rehydration -Patient was asked to not be transferred to the floor until sufficient  and effective IV access was placed, so that he can be adequately rehydrated.  Uncontrolled DM -Na+ 128 (corrects to 134) -K+ 5.7 -Glucose 496, repeat 552; 336 on 4/10 -Anion gap 16 -UA: >500 glucose, small Hgb, 5 ketones -A1c 10.2 on 4/19 -While these labs may be suggestive of very mild DKA, the patient has a markedly elevated A1c indicative of chronic poor glycemic control -With adequate rehydration as above in conjunction with basal and bolus insulin, his hyperglycemia  can likely be effectively managed without need for SDU/insulin drip -Will closely monitor his BMP to ensure that he is not worsening - although until the IVF boluses are complete, he is unlikely to make significant improvement -The patient lives alone and this raises concern about whether this is the best environment for him.  He reports that his daughter visits him daily.  He likely either needs home health assistance or placement.  Will request SW consult.  Rib pain -Will obtain CXR to assess for rib fractures.  DVT prophylaxis: Lovenox  Code Status: Full - confirmed with patient Family Communication: None present Disposition Plan:  To be determined Consults called: SW Admission status: It is my clinical opinion that referral for OBSERVATION is reasonable and necessary in this patient based on the above information provided. The aforementioned taken together are felt to place the patient at high risk for further clinical deterioration. However it is anticipated that the patient may be medically stable for discharge from the hospital within 24 to 48 hours.     Karmen Bongo MD [facility]  If 7PM-7AM, please contact night-coverage [email or web address] Password [email or web address]  01/29/2017, 11:57 PM

## 2017-01-29 NOTE — ED Notes (Signed)
MD made aware that line is infiltrated. Fluid bags stopped.

## 2017-01-29 NOTE — ED Notes (Signed)
IV team nurse unable to obtain IV access. MD made aware.

## 2017-01-29 NOTE — ED Notes (Signed)
Santiago Glad, PA attempted ultrasound IVs unsuccessfully.  Matt, RN attempted successfully with placement in the upper left arm.

## 2017-01-29 NOTE — ED Notes (Signed)
CBG 524

## 2017-01-30 DIAGNOSIS — E1110 Type 2 diabetes mellitus with ketoacidosis without coma: Secondary | ICD-10-CM

## 2017-01-30 DIAGNOSIS — E1310 Other specified diabetes mellitus with ketoacidosis without coma: Secondary | ICD-10-CM | POA: Diagnosis not present

## 2017-01-30 DIAGNOSIS — R0781 Pleurodynia: Secondary | ICD-10-CM | POA: Diagnosis not present

## 2017-01-30 DIAGNOSIS — E871 Hypo-osmolality and hyponatremia: Secondary | ICD-10-CM

## 2017-01-30 DIAGNOSIS — N178 Other acute kidney failure: Secondary | ICD-10-CM | POA: Diagnosis not present

## 2017-01-30 LAB — BASIC METABOLIC PANEL
Anion gap: 19 — ABNORMAL HIGH (ref 5–15)
Anion gap: 17 — ABNORMAL HIGH (ref 5–15)
Anion gap: 3 — ABNORMAL LOW (ref 5–15)
Anion gap: 9 (ref 5–15)
Anion gap: 11 (ref 5–15)
BUN: 58 mg/dL — ABNORMAL HIGH (ref 6–20)
BUN: 49 mg/dL — ABNORMAL HIGH (ref 6–20)
BUN: 37 mg/dL — ABNORMAL HIGH (ref 6–20)
BUN: 33 mg/dL — ABNORMAL HIGH (ref 6–20)
BUN: 32 mg/dL — ABNORMAL HIGH (ref 6–20)
CO2: 11 mmol/L — ABNORMAL LOW (ref 22–32)
CO2: 12 mmol/L — ABNORMAL LOW (ref 22–32)
CO2: 19 mmol/L — ABNORMAL LOW (ref 22–32)
CO2: 15 mmol/L — ABNORMAL LOW (ref 22–32)
CO2: 15 mmol/L — ABNORMAL LOW (ref 22–32)
Calcium: 8.6 mg/dL — ABNORMAL LOW (ref 8.9–10.3)
Calcium: 8.8 mg/dL — ABNORMAL LOW (ref 8.9–10.3)
Calcium: 7.6 mg/dL — ABNORMAL LOW (ref 8.9–10.3)
Calcium: 7.8 mg/dL — ABNORMAL LOW (ref 8.9–10.3)
Calcium: 8 mg/dL — ABNORMAL LOW (ref 8.9–10.3)
Chloride: 100 mmol/L — ABNORMAL LOW (ref 101–111)
Chloride: 104 mmol/L (ref 101–111)
Chloride: 113 mmol/L — ABNORMAL HIGH (ref 101–111)
Chloride: 110 mmol/L (ref 101–111)
Chloride: 109 mmol/L (ref 101–111)
Creatinine, Ser: 1.81 mg/dL — ABNORMAL HIGH (ref 0.61–1.24)
Creatinine, Ser: 1.63 mg/dL — ABNORMAL HIGH (ref 0.61–1.24)
Creatinine, Ser: 1.01 mg/dL (ref 0.61–1.24)
Creatinine, Ser: 1.24 mg/dL (ref 0.61–1.24)
Creatinine, Ser: 1.19 mg/dL (ref 0.61–1.24)
GFR calc Af Amer: 42 mL/min — ABNORMAL LOW (ref >60)
GFR calc Af Amer: 47 mL/min — ABNORMAL LOW (ref >60)
GFR calc Af Amer: >60 mL/min (ref >60)
GFR calc Af Amer: >60 mL/min (ref >60)
GFR calc Af Amer: >60 mL/min (ref >60)
GFR calc non Af Amer: 36 mL/min — ABNORMAL LOW (ref >60)
GFR calc non Af Amer: 41 mL/min — ABNORMAL LOW (ref >60)
GFR calc non Af Amer: >60 mL/min (ref >60)
GFR calc non Af Amer: 57 mL/min — ABNORMAL LOW (ref >60)
GFR calc non Af Amer: 60 mL/min — ABNORMAL LOW (ref >60)
Glucose, Bld: 710 mg/dL (ref 65–99)
Glucose, Bld: 286 mg/dL — ABNORMAL HIGH (ref 65–99)
Glucose, Bld: 82 mg/dL (ref 65–99)
Glucose, Bld: 228 mg/dL — ABNORMAL HIGH (ref 65–99)
Glucose, Bld: 185 mg/dL — ABNORMAL HIGH (ref 65–99)
Potassium: 5.3 mmol/L — ABNORMAL HIGH (ref 3.5–5.1)
Potassium: 4.1 mmol/L (ref 3.5–5.1)
Potassium: 3.6 mmol/L (ref 3.5–5.1)
Potassium: 4.5 mmol/L (ref 3.5–5.1)
Potassium: 4 mmol/L (ref 3.5–5.1)
Sodium: 130 mmol/L — ABNORMAL LOW (ref 135–145)
Sodium: 133 mmol/L — ABNORMAL LOW (ref 135–145)
Sodium: 135 mmol/L (ref 135–145)
Sodium: 134 mmol/L — ABNORMAL LOW (ref 135–145)
Sodium: 135 mmol/L (ref 135–145)

## 2017-01-30 LAB — GLUCOSE, CAPILLARY
Glucose-Capillary: 121 mg/dL — ABNORMAL HIGH (ref 65–99)
Glucose-Capillary: 80 mg/dL (ref 65–99)
Glucose-Capillary: 74 mg/dL (ref 65–99)
Glucose-Capillary: 64 mg/dL — ABNORMAL LOW (ref 65–99)
Glucose-Capillary: 93 mg/dL (ref 65–99)
Glucose-Capillary: 71 mg/dL (ref 65–99)
Glucose-Capillary: 62 mg/dL — ABNORMAL LOW (ref 65–99)
Glucose-Capillary: 99 mg/dL (ref 65–99)
Glucose-Capillary: 82 mg/dL (ref 65–99)
Glucose-Capillary: 111 mg/dL — ABNORMAL HIGH (ref 65–99)
Glucose-Capillary: 61 mg/dL — ABNORMAL LOW (ref 65–99)
Glucose-Capillary: 158 mg/dL — ABNORMAL HIGH (ref 65–99)
Glucose-Capillary: 207 mg/dL — ABNORMAL HIGH (ref 65–99)
Glucose-Capillary: 201 mg/dL — ABNORMAL HIGH (ref 65–99)
Glucose-Capillary: 271 mg/dL — ABNORMAL HIGH (ref 65–99)
Glucose-Capillary: 281 mg/dL — ABNORMAL HIGH (ref 65–99)
Glucose-Capillary: 188 mg/dL — ABNORMAL HIGH (ref 65–99)
Glucose-Capillary: 160 mg/dL — ABNORMAL HIGH (ref 65–99)
Glucose-Capillary: 124 mg/dL — ABNORMAL HIGH (ref 65–99)
Glucose-Capillary: 87 mg/dL (ref 65–99)
Glucose-Capillary: 79 mg/dL (ref 65–99)

## 2017-01-30 LAB — CBC
HCT: 31.4 % — ABNORMAL LOW (ref 39.0–52.0)
Hemoglobin: 10.6 g/dL — ABNORMAL LOW (ref 13.0–17.0)
MCH: 31 pg (ref 26.0–34.0)
MCHC: 33.8 g/dL (ref 30.0–36.0)
MCV: 91.8 fL (ref 78.0–100.0)
Platelets: 376 10*3/uL (ref 150–400)
RBC: 3.42 MIL/uL — ABNORMAL LOW (ref 4.22–5.81)
RDW: 13.7 % (ref 11.5–15.5)
WBC: 17.4 10*3/uL — ABNORMAL HIGH (ref 4.0–10.5)

## 2017-01-30 LAB — MRSA PCR SCREENING: MRSA by PCR: NEGATIVE

## 2017-01-30 MED ORDER — INSULIN DETEMIR 100 UNIT/ML FLEXPEN: 20.0000 [IU] | PEN_INJECTOR | Freq: Every day | SUBCUTANEOUS | 2 refills | Status: DC

## 2017-01-30 MED ORDER — CARVEDILOL 6.25 MG PO TABS: 6.2500 mg | ORAL_TABLET | Freq: Two times a day (BID) | ORAL | 1 refills | Status: DC

## 2017-01-30 MED ORDER — HYDROCODONE-ACETAMINOPHEN 5-325 MG PO TABS
1.0000 | ORAL_TABLET | Freq: Four times a day (QID) | ORAL | Status: DC | PRN
  Administered 2017-01-30 – 2017-02-01 (×4): 1 via ORAL
  Filled 2017-01-30 (×4): qty 1

## 2017-01-30 MED ORDER — DEXTROSE-NACL 5-0.45 % IV SOLN
INTRAVENOUS | Status: DC
  Administered 2017-01-30 – 2017-01-31 (×3): via INTRAVENOUS

## 2017-01-30 MED ORDER — ENOXAPARIN SODIUM 40 MG/0.4ML ~~LOC~~ SOLN
40.0000 mg | Freq: Every day | SUBCUTANEOUS | Status: DC
  Administered 2017-01-30 – 2017-01-31 (×2): 40 mg via SUBCUTANEOUS
  Filled 2017-01-30 (×2): qty 0.4

## 2017-01-30 MED ORDER — SODIUM CHLORIDE 0.9 % IV BOLUS (SEPSIS)
2000.0000 mL | Freq: Once | INTRAVENOUS | Status: AC
  Administered 2017-01-30: 2000 mL via INTRAVENOUS

## 2017-01-30 MED ORDER — FLUOXETINE HCL 40 MG PO CAPS: 40.0000 mg | ORAL_CAPSULE | Freq: Every day | ORAL | 3 refills | Status: DC

## 2017-01-30 MED ORDER — INSULIN ASPART 100 UNIT/ML ~~LOC~~ SOLN
25.0000 [IU] | SUBCUTANEOUS | Status: AC
  Administered 2017-01-30: 25 [IU] via SUBCUTANEOUS

## 2017-01-30 MED ORDER — INSULIN REGULAR HUMAN 100 UNIT/ML IJ SOLN
INTRAMUSCULAR | Status: DC
  Administered 2017-01-30: 0.6 [IU]/h via INTRAVENOUS
  Filled 2017-01-30: qty 2.5

## 2017-01-30 MED ORDER — SODIUM CHLORIDE 0.9 % IV SOLN: INTRAVENOUS | Status: DC

## 2017-01-30 MED ORDER — ENSURE ENLIVE PO LIQD
237.0000 mL | Freq: Two times a day (BID) | ORAL | Status: DC
  Administered 2017-01-30: 237 mL via ORAL

## 2017-01-30 MED ORDER — SODIUM CHLORIDE 0.9 % IV BOLUS (SEPSIS)
1000.0000 mL | Freq: Once | INTRAVENOUS | Status: AC
  Administered 2017-01-30: 1000 mL via INTRAVENOUS

## 2017-01-30 MED ORDER — AMLODIPINE BESYLATE 10 MG PO TABS: 10.0000 mg | ORAL_TABLET | Freq: Every day | ORAL | 3 refills | Status: DC

## 2017-01-30 NOTE — Progress Notes (Signed)
Dr. Lonny Prude called about consistent low CBG readings before correction.

## 2017-01-30 NOTE — Progress Notes (Signed)
Inpatient Diabetes Program Recommendations  AACE/ADA: New Consensus Statement on Inpatient Glycemic Control (2015)  Target Ranges:  Prepandial:   less than 140 mg/dL      Peak postprandial:   less than 180 mg/dL (1-2 hours)      Critically ill patients:  140 - 180 mg/dL   Lab Results  Component Value Date   GLUCAP 111 (H) 01/30/2017   HGBA1C 10.2 01/29/2017    Review of Glycemic Control  Diabetes history: DM2 Outpatient Diabetes medications: Levemir 20 units QHS, Novolog 0-15 units tidwc Current orders for Inpatient glycemic control: IV insulin per DKA orders  Pt presents to ED from PCP office with hyperglycemia. Labs concerning for DKA. IV access difficult to obtain. Received first dose of insulin 11 hours after admission. Given Levemir 20 units and Novolog 25 units at 2334 on 4/19 and Novolog 25 units at 0312 on 4/20. IV insulin drip started at 0510 today. Hypoglycemia throughout the morning d/t 50 units of Novolog given within a span of 3.5 hours.   Inpatient Diabetes Program Recommendations:    Levemir 16 units QHS Novolog 0-9 units tidwc and hs Novolog 3 units tidwc for meal coverage insulin.  Has had 7 admissions and 2 ED visits within past 6 months related to DKA or hypoglycemia. Appears to need 24 hour care with insulin administration and continued hypo- or hyperglycemia. Pt needs more supervision if being discharged home. Would recommend SNF placement for safety of patient.  Discussed with MD x2 and RN.  Continue to follow. Daughter needs much more education regarding diabetes. She holds insulin at home.  Thank you. Lorenda Peck, RD, LDN, CDE Inpatient Diabetes Coordinator [phone]

## 2017-01-30 NOTE — Progress Notes (Signed)
CRITICAL VALUE ALERT  Critical value received:  CBG 710  Date of notification:  01/30/2017  Time of notification:  4970  Critical value read back:Yes.    Nurse who received alert:  Kathryne Hitch RN  MD notified (1st page):  KIRBY NP  Time of first page:  0255  MD notified (2nd page):  Time of second page:  Responding MD:  Baltazar Najjar  Time MD responded:  2637

## 2017-01-30 NOTE — Progress Notes (Signed)
PROGRESS NOTE    [name]  WFU:[id number] DOB: [birth year] DOA: 01/29/2017 PCP: [name], [name]   Brief Narrative: [name] [name] is a 71 y.o. male with medical history significant of CVA, HTN, DM. He presented with nausea, vomiting and abdominal pain found to be in DKA and have a rib fracture   Assessment & Plan:   Principal Problem:   Acute renal failure superimposed on stage 3 chronic kidney disease ([name]) Active Problems:   Uncontrolled type 2 diabetes mellitus with complication ([name])   Dehydration   Rib pain on left side   Acute renal failure on CKD stage III Likely secondary to dehydration from severe hyperglycemia. Resolved with IV fluids -continue IV fluids  DKA Uncontrolled diabetes mellitus, type 2 Patient slid into DKA overnight. Insulin drip started. -BMP every 4 hours -transition to subcutaneous insulin once gap closed x2 and CO2 improved  Rib pain Possible 11th rib fracture seen on chest x-ray -supportive management   DVT prophylaxis: Lovenox Code Status: Full code Family Communication: None at bedside Disposition Plan: Discharge pending resolution of DKA and transition to subcutaneous insulin   Consultants:   None  Procedures:   Insulin drip (4/20>>  Antimicrobials:  None    Subjective: Patient reports no nausea, vomiting or chest pain, but does have some left sided upper abdomen pain around side.  Objective: Vitals:   01/30/17 0910 01/30/17 1000 01/30/17 1106 01/30/17 1200  BP:  (!) 101/54  116/60  Pulse:  (!) 54 (!) 57 (!) 58  Resp:  16  (!) 22  Temp: 97.7 F (36.5 C)     TempSrc: Oral     SpO2:  96%  96%  Weight:      Height:        Intake/Output Summary (Last 24 hours) at 01/30/17 1329 Last data filed at 01/30/17 1227  Gross per 24 hour  Intake                0 ml  Output             1400 ml  Net            -1400 ml   Filed Weights   01/30/17 0600  Weight: 59.8 kg (131 lb 13.4 oz)    Examination:  General exam:  Appears calm and comfortable Respiratory system: Clear to auscultation. Respiratory effort normal. Cardiovascular system: S1 & S2 heard, RRR. No murmurs. Gastrointestinal system: Abdomen is nondistended, soft and nontender. Normal bowel sounds heard. Central nervous system: Alert and oriented. No focal neurological deficits. Extremities: No edema. No calf tenderness Skin: No cyanosis. No rashes Psychiatry: Judgement and insight appear normal. Mood & affect appropriate.     Data Reviewed: I have personally reviewed following labs and imaging studies  CBC:  Recent Labs Lab 01/29/17 1539 01/30/17 0307  WBC 12.8* 17.4*  NEUTROABS 9.5*  --   HGB 10.3* 10.6*  HCT 30.6* 31.4*  MCV 91.9 91.8  PLT 378 202   Basic Metabolic Panel:  Recent Labs Lab 01/29/17 1320 01/29/17 2350 01/30/17 0307 01/30/17 1109  NA 128* 130* 133* 135  K 5.7* 5.3* 4.1 3.6  CL 96* 100* 104 113*  CO2 16* 11* 12* 19*  GLUCOSE 496* 710* 286* 82  BUN 73* 58* 49* 37*  CREATININE 2.04* 1.81* 1.63* 1.01  CALCIUM 9.5 8.6* 8.8* 7.6*   GFR: Estimated Creatinine Clearance: 56.7 mL/min (by C-G formula based on SCr of 1.01 mg/dL). Liver Function Tests: No results for input(s): AST,  ALT, ALKPHOS, BILITOT, PROT, ALBUMIN in the last 168 hours. No results for input(s): LIPASE, AMYLASE in the last 168 hours. No results for input(s): AMMONIA in the last 168 hours. Coagulation Profile: No results for input(s): INR, PROTIME in the last 168 hours. Cardiac Enzymes: No results for input(s): CKTOTAL, CKMB, CKMBINDEX, TROPONINI in the last 168 hours. BNP (last 3 results) No results for input(s): PROBNP in the last 8760 hours. HbA1C:  Recent Labs  01/29/17 1150  HGBA1C 10.2   CBG:  Recent Labs Lab 01/30/17 1017 01/30/17 1034 01/30/17 1134 01/30/17 1235 01/30/17 1252  GLUCAP 62* 99 82 61* 111*   Lipid Profile: No results for input(s): CHOL, HDL, LDLCALC, TRIG, CHOLHDL, LDLDIRECT in the last 72  hours. Thyroid Function Tests: No results for input(s): TSH, T4TOTAL, FREET4, T3FREE, THYROIDAB in the last 72 hours. Anemia Panel: No results for input(s): VITAMINB12, FOLATE, FERRITIN, TIBC, IRON, RETICCTPCT in the last 72 hours. Sepsis Labs: No results for input(s): PROCALCITON, LATICACIDVEN in the last 168 hours.  Recent Results (from the past 240 hour(s))  MRSA PCR Screening     Status: None   Collection Time: 01/30/17  5:50 AM  Result Value Ref Range Status   MRSA by PCR NEGATIVE NEGATIVE Final    Comment:        The GeneXpert MRSA Assay (FDA approved for NASAL specimens only), is one component of a comprehensive MRSA colonization surveillance program. It is not intended to diagnose MRSA infection nor to guide or monitor treatment for MRSA infections.          Radiology Studies: Dg Chest 2 View  Result Date: 01/29/2017 CLINICAL DATA:  Left rib pain. No known injury. History of diabetes and hypertension. EXAM: CHEST  2 VIEW COMPARISON:  12/28/2016 FINDINGS: Mild irregularity of the posterior left eleventh rib. In the setting of rib pain, this could indicate a fracture. Shallow inspiration. Normal heart size and pulmonary vascularity. No focal airspace disease or consolidation in the lungs. No blunting of costophrenic angles. No pneumothorax. Mild wedge deformities of mid thoracic vertebrae without change since 07/05/2016 IMPRESSION: Possible left posterior eleventh rib fracture. No evidence of active pulmonary disease. Electronically Signed   By: Lucienne Capers M.D.   On: 01/29/2017 21:48        Scheduled Meds: . amLODipine  10 mg Oral Daily  . aspirin EC  81 mg Oral Daily  . carvedilol  6.25 mg Oral BID WC  . enoxaparin (LOVENOX) injection  40 mg Subcutaneous QHS  . feeding supplement (ENSURE ENLIVE)  237 mL Oral BID BM  . FLUoxetine  40 mg Oral Daily   Continuous Infusions: . sodium chloride    . dextrose 5 % and 0.45% NaCl 125 mL/hr at 01/30/17 0515  .  insulin (NOVOLIN-R) infusion Stopped (01/30/17 1660)     LOS: 0 days     Cordelia Poche, MD [facility] 01/30/2017, 1:29 PM Pager: [phone]  If 7PM-7AM, please contact night-coverage [email or web address] Password [email or web address] 01/30/2017, 1:29 PM

## 2017-01-30 NOTE — Addendum Note (Signed)
Addended by: [name] on: 01/30/2017 01:52 PM   Modules accepted: Orders

## 2017-01-30 NOTE — Progress Notes (Signed)
Initial Nutrition Assessment  INTERVENTION:   Provide Ensure Enlive po BID, each supplement provides 350 kcal and 20 grams of protein RD to continue to monitor for needs  NUTRITION DIAGNOSIS:   Unintentional weight loss related to chronic illness as evidenced by percent weight loss.  GOAL:   Patient will meet greater than or equal to 90% of their needs  MONITOR:   PO intake, Supplement acceptance, Labs, Weight trends, I & O's  REASON FOR ASSESSMENT:   Malnutrition Screening Tool    ASSESSMENT:    71 y.o. male with medical history significant of CVA, HTN, DM - last A1c 10+ 7 days ago presenting with n/v/abdominal pain.  He reports "My daughter brought me to the ER.  She said I was throwing up and I was complaining about hurting in the stomach".  Still with some abdominal pain, right under the ribcage on the left.  He fell yesterday and thinks that is the source of his pain.  Last emesis was this afternoon while still at home.  Sugars "so far they're doing pretty good".  However, the machine said it was "high" when he came in, unsure why.  Pt in room eating breakfast tray. Pt states he feels hungry and has continued to eat well PTA. RD saw pt during previous admission earlier this month and he was eating 100% of his meals at that time. Pt denies N/V. Pt would like Ensure supplements, RD will order.  Per chart review, pt has lost 24 lb since 12/22 (15% wt loss x 4 months, significant for time frame). Nutrition-Focused physical exam completed. Findings are no fat depletion, mild muscle depletion, and no edema.   Medications: D5 -.45% NaCl infusion at 125 ml/hr -provides 510 kcal Labs reviewed: CBGs: 61-111  Diet Order:  Diet heart healthy/carb modified Room service appropriate? Yes; Fluid consistency: Thin  Skin:  Reviewed, no issues  Last BM:  4/19  Height:   Ht Readings from Last 1 Encounters:  01/30/17 5\' 9"  (1.753 m)    Weight:   Wt Readings from Last 1 Encounters:   01/30/17 131 lb 13.4 oz (59.8 kg)    Ideal Body Weight:  72.7 kg  BMI:  Body mass index is 19.47 kg/m.  Estimated Nutritional Needs:   Kcal:  7048-8891  Protein:  70-80g  Fluid:  1.6L/day  EDUCATION NEEDS:   No education needs identified at this time  [name] Bibles, MS, RD, LDN Pager: [phone] After Hours Pager: [phone]

## 2017-01-31 DIAGNOSIS — N179 Acute kidney failure, unspecified: Principal | ICD-10-CM

## 2017-01-31 DIAGNOSIS — Z794 Long term (current) use of insulin: Secondary | ICD-10-CM | POA: Diagnosis not present

## 2017-01-31 DIAGNOSIS — Z96641 Presence of right artificial hip joint: Secondary | ICD-10-CM | POA: Diagnosis present

## 2017-01-31 DIAGNOSIS — Z8673 Personal history of transient ischemic attack (TIA), and cerebral infarction without residual deficits: Secondary | ICD-10-CM | POA: Diagnosis not present

## 2017-01-31 DIAGNOSIS — E1310 Other specified diabetes mellitus with ketoacidosis without coma: Secondary | ICD-10-CM | POA: Diagnosis not present

## 2017-01-31 DIAGNOSIS — I129 Hypertensive chronic kidney disease with stage 1 through stage 4 chronic kidney disease, or unspecified chronic kidney disease: Secondary | ICD-10-CM | POA: Diagnosis present

## 2017-01-31 DIAGNOSIS — Z79899 Other long term (current) drug therapy: Secondary | ICD-10-CM | POA: Diagnosis not present

## 2017-01-31 DIAGNOSIS — E1110 Type 2 diabetes mellitus with ketoacidosis without coma: Secondary | ICD-10-CM | POA: Diagnosis not present

## 2017-01-31 DIAGNOSIS — S2239XA Fracture of one rib, unspecified side, initial encounter for closed fracture: Secondary | ICD-10-CM | POA: Diagnosis present

## 2017-01-31 DIAGNOSIS — E860 Dehydration: Secondary | ICD-10-CM | POA: Diagnosis present

## 2017-01-31 DIAGNOSIS — Z7982 Long term (current) use of aspirin: Secondary | ICD-10-CM | POA: Diagnosis not present

## 2017-01-31 DIAGNOSIS — Z87891 Personal history of nicotine dependence: Secondary | ICD-10-CM | POA: Diagnosis not present

## 2017-01-31 DIAGNOSIS — W19XXXA Unspecified fall, initial encounter: Secondary | ICD-10-CM | POA: Diagnosis present

## 2017-01-31 DIAGNOSIS — E1122 Type 2 diabetes mellitus with diabetic chronic kidney disease: Secondary | ICD-10-CM | POA: Diagnosis present

## 2017-01-31 DIAGNOSIS — E871 Hypo-osmolality and hyponatremia: Secondary | ICD-10-CM | POA: Diagnosis present

## 2017-01-31 DIAGNOSIS — N183 Chronic kidney disease, stage 3 (moderate): Secondary | ICD-10-CM | POA: Diagnosis present

## 2017-01-31 DIAGNOSIS — R0781 Pleurodynia: Secondary | ICD-10-CM | POA: Diagnosis not present

## 2017-01-31 LAB — GLUCOSE, CAPILLARY
Glucose-Capillary: 100 mg/dL — ABNORMAL HIGH (ref 65–99)
Glucose-Capillary: 101 mg/dL — ABNORMAL HIGH (ref 65–99)
Glucose-Capillary: 103 mg/dL — ABNORMAL HIGH (ref 65–99)
Glucose-Capillary: 107 mg/dL — ABNORMAL HIGH (ref 65–99)
Glucose-Capillary: 117 mg/dL — ABNORMAL HIGH (ref 65–99)
Glucose-Capillary: 125 mg/dL — ABNORMAL HIGH (ref 65–99)
Glucose-Capillary: 127 mg/dL — ABNORMAL HIGH (ref 65–99)
Glucose-Capillary: 146 mg/dL — ABNORMAL HIGH (ref 65–99)
Glucose-Capillary: 147 mg/dL — ABNORMAL HIGH (ref 65–99)
Glucose-Capillary: 148 mg/dL — ABNORMAL HIGH (ref 65–99)
Glucose-Capillary: 156 mg/dL — ABNORMAL HIGH (ref 65–99)
Glucose-Capillary: 156 mg/dL — ABNORMAL HIGH (ref 65–99)
Glucose-Capillary: 161 mg/dL — ABNORMAL HIGH (ref 65–99)
Glucose-Capillary: 166 mg/dL — ABNORMAL HIGH (ref 65–99)
Glucose-Capillary: 182 mg/dL — ABNORMAL HIGH (ref 65–99)
Glucose-Capillary: 189 mg/dL — ABNORMAL HIGH (ref 65–99)
Glucose-Capillary: 191 mg/dL — ABNORMAL HIGH (ref 65–99)
Glucose-Capillary: 195 mg/dL — ABNORMAL HIGH (ref 65–99)
Glucose-Capillary: 207 mg/dL — ABNORMAL HIGH (ref 65–99)
Glucose-Capillary: 209 mg/dL — ABNORMAL HIGH (ref 65–99)
Glucose-Capillary: 74 mg/dL (ref 65–99)
Glucose-Capillary: 91 mg/dL (ref 65–99)
Glucose-Capillary: 93 mg/dL (ref 65–99)

## 2017-01-31 LAB — BASIC METABOLIC PANEL
Anion gap: 10 (ref 5–15)
Anion gap: 6 (ref 5–15)
Anion gap: 7 (ref 5–15)
Anion gap: 8 (ref 5–15)
Anion gap: 8 (ref 5–15)
Anion gap: 9 (ref 5–15)
BUN: 16 mg/dL (ref 6–20)
BUN: 16 mg/dL (ref 6–20)
BUN: 17 mg/dL (ref 6–20)
BUN: 19 mg/dL (ref 6–20)
BUN: 23 mg/dL — ABNORMAL HIGH (ref 6–20)
BUN: 27 mg/dL — ABNORMAL HIGH (ref 6–20)
CO2: 16 mmol/L — ABNORMAL LOW (ref 22–32)
CO2: 18 mmol/L — ABNORMAL LOW (ref 22–32)
CO2: 18 mmol/L — ABNORMAL LOW (ref 22–32)
CO2: 19 mmol/L — ABNORMAL LOW (ref 22–32)
CO2: 20 mmol/L — ABNORMAL LOW (ref 22–32)
CO2: 21 mmol/L — ABNORMAL LOW (ref 22–32)
Calcium: 7.7 mg/dL — ABNORMAL LOW (ref 8.9–10.3)
Calcium: 7.8 mg/dL — ABNORMAL LOW (ref 8.9–10.3)
Calcium: 7.9 mg/dL — ABNORMAL LOW (ref 8.9–10.3)
Calcium: 7.9 mg/dL — ABNORMAL LOW (ref 8.9–10.3)
Calcium: 8 mg/dL — ABNORMAL LOW (ref 8.9–10.3)
Calcium: 8 mg/dL — ABNORMAL LOW (ref 8.9–10.3)
Chloride: 105 mmol/L (ref 101–111)
Chloride: 106 mmol/L (ref 101–111)
Chloride: 108 mmol/L (ref 101–111)
Chloride: 108 mmol/L (ref 101–111)
Chloride: 108 mmol/L (ref 101–111)
Chloride: 109 mmol/L (ref 101–111)
Creatinine, Ser: 0.94 mg/dL (ref 0.61–1.24)
Creatinine, Ser: 1.02 mg/dL (ref 0.61–1.24)
Creatinine, Ser: 1.02 mg/dL (ref 0.61–1.24)
Creatinine, Ser: 1.03 mg/dL (ref 0.61–1.24)
Creatinine, Ser: 1.06 mg/dL (ref 0.61–1.24)
Creatinine, Ser: 1.13 mg/dL (ref 0.61–1.24)
GFR calc Af Amer: >60 mL/min (ref >60)
GFR calc Af Amer: >60 mL/min (ref >60)
GFR calc Af Amer: >60 mL/min (ref >60)
GFR calc Af Amer: >60 mL/min (ref >60)
GFR calc Af Amer: >60 mL/min (ref >60)
GFR calc Af Amer: >60 mL/min (ref >60)
GFR calc non Af Amer: >60 mL/min (ref >60)
GFR calc non Af Amer: >60 mL/min (ref >60)
GFR calc non Af Amer: >60 mL/min (ref >60)
GFR calc non Af Amer: >60 mL/min (ref >60)
GFR calc non Af Amer: >60 mL/min (ref >60)
GFR calc non Af Amer: >60 mL/min (ref >60)
Glucose, Bld: 102 mg/dL — ABNORMAL HIGH (ref 65–99)
Glucose, Bld: 106 mg/dL — ABNORMAL HIGH (ref 65–99)
Glucose, Bld: 141 mg/dL — ABNORMAL HIGH (ref 65–99)
Glucose, Bld: 177 mg/dL — ABNORMAL HIGH (ref 65–99)
Glucose, Bld: 189 mg/dL — ABNORMAL HIGH (ref 65–99)
Glucose, Bld: 204 mg/dL — ABNORMAL HIGH (ref 65–99)
Potassium: 3.6 mmol/L (ref 3.5–5.1)
Potassium: 3.6 mmol/L (ref 3.5–5.1)
Potassium: 3.7 mmol/L (ref 3.5–5.1)
Potassium: 3.9 mmol/L (ref 3.5–5.1)
Potassium: 4 mmol/L (ref 3.5–5.1)
Potassium: 4.1 mmol/L (ref 3.5–5.1)
Sodium: 133 mmol/L — ABNORMAL LOW (ref 135–145)
Sodium: 134 mmol/L — ABNORMAL LOW (ref 135–145)
Sodium: 134 mmol/L — ABNORMAL LOW (ref 135–145)
Sodium: 134 mmol/L — ABNORMAL LOW (ref 135–145)
Sodium: 134 mmol/L — ABNORMAL LOW (ref 135–145)
Sodium: 135 mmol/L (ref 135–145)

## 2017-01-31 LAB — CBC
HCT: 26.8 % — ABNORMAL LOW (ref 39.0–52.0)
Hemoglobin: 9.2 g/dL — ABNORMAL LOW (ref 13.0–17.0)
MCH: 31.3 pg (ref 26.0–34.0)
MCHC: 34.3 g/dL (ref 30.0–36.0)
MCV: 91.2 fL (ref 78.0–100.0)
Platelets: 312 10*3/uL (ref 150–400)
RBC: 2.94 MIL/uL — ABNORMAL LOW (ref 4.22–5.81)
RDW: 13.6 % (ref 11.5–15.5)
WBC: 9.6 10*3/uL (ref 4.0–10.5)

## 2017-01-31 MED ORDER — SODIUM BICARBONATE 8.4 % IV SOLN
INTRAVENOUS | Status: DC
  Administered 2017-01-31 (×2): via INTRAVENOUS
  Filled 2017-01-31 (×2): qty 150

## 2017-01-31 MED ORDER — INSULIN DETEMIR 100 UNIT/ML ~~LOC~~ SOLN
20.0000 [IU] | Freq: Every day | SUBCUTANEOUS | Status: DC
  Administered 2017-01-31: 20 [IU] via SUBCUTANEOUS
  Filled 2017-01-31 (×2): qty 0.2

## 2017-01-31 NOTE — Evaluation (Signed)
Physical Therapy Evaluation Patient Details Name: [name] [name] MRN: [id number] DOB: May 28, 1946 Today's Date: 01/31/2017   History of Present Illness  71 y.o. male with medical history significant of prior CVA, multiple admissions to the hospital for DKA, diabetes mellitus, hypertension, polysubstance abuse admitted with AKI, hyperglycemia, hyperkalemia and UTI  Clinical Impression  Pt admitted as above and presenting with functional mobility limitations 2* generalized weakness and instability with ambulation.  Pt hopes to dc to home with intermittent assist of family and would benefit from follow up [place].    Follow Up Recommendations Home health PT;Supervision - Intermittent    Equipment Recommendations  None recommended by PT    Recommendations for Other Services       Precautions / Restrictions Precautions Precautions: Fall Restrictions Weight Bearing Restrictions: No      Mobility  Bed Mobility Overal bed mobility: Modified Independent Bed Mobility: Supine to Sit           General bed mobility comments: Pt to EOB unassisted  Transfers Overall transfer level: Needs assistance Equipment used: Rolling walker (2 wheeled) Transfers: Sit to/from Stand Sit to Stand: Min guard         General transfer comment: min guard for safety and to stabilize with initial standing  Ambulation/Gait Ambulation/Gait assistance: Min guard Ambulation Distance (Feet): 159 Feet Assistive device: Rolling walker (2 wheeled) Gait Pattern/deviations: Step-through pattern;Decreased step length - right;Decreased step length - left;Shuffle;Trunk flexed     General Gait Details: Cues for posture and position from [employer]            Wheelchair Mobility    Modified Rankin (Stroke Patients Only)       Balance Overall balance assessment: Needs assistance Sitting-balance support: No upper extremity supported;Feet supported Sitting balance-Leahy Scale: Good     Standing  balance support: No upper extremity supported Standing balance-Leahy Scale: Fair                               Pertinent Vitals/Pain Pain Assessment: No/denies pain    Home Living Family/patient expects to be discharged to:: Private residence Living Arrangements: Alone Available Help at Discharge: Family;Available PRN/intermittently Type of Home: Apartment Home Access: Level entry     Home Layout: One level Home Equipment: Walker - 2 wheels;Cane - single point Additional Comments: Pt states his dtr checks in on him daily    Prior Function Level of Independence: Needs assistance   Gait / Transfers Assistance Needed: pt reports ambulating without assistive device  ADL's / Homemaking Assistance Needed: daughter checks on pt each morning. pt states he bathes and dresses himself.   Comments: uncertain of above accuracy     Hand Dominance   Dominant Hand: Right    Extremity/Trunk Assessment   Upper Extremity Assessment Upper Extremity Assessment: Generalized weakness    Lower Extremity Assessment Lower Extremity Assessment: Generalized weakness       Communication   Communication: No difficulties  Cognition Arousal/Alertness: Awake/alert Behavior During Therapy: WFL for tasks assessed/performed Overall Cognitive Status: Within Functional Limits for tasks assessed                                 General Comments: pt appropriate, pleasant and follows commands, states no falls however found down leading to admission, uncertain of PLOF accuracy      General Comments      Exercises  Assessment/Plan    PT Assessment Patient needs continued PT services  PT Problem List Decreased strength;Decreased mobility;Decreased knowledge of use of DME;Decreased balance       PT Treatment Interventions DME instruction;Gait training;Therapeutic exercise;Therapeutic activities;Functional mobility training;Patient/family education    PT Goals  (Current goals can be found in the Care Plan section)  Acute Rehab PT Goals Patient Stated Goal: home PT Goal Formulation: With patient Time For Goal Achievement: 01/24/17 Potential to Achieve Goals: Good    Frequency Min 3X/week   Barriers to discharge Decreased caregiver support Lives alone with dtr checking on him    Co-evaluation               End of Session Equipment Utilized During Treatment: Gait belt Activity Tolerance: Patient tolerated treatment well Patient left: in chair;with call bell/phone within reach;with chair alarm set Nurse Communication: Mobility status PT Visit Diagnosis: Unsteadiness on feet (R26.81)    Time: 6789-3810 PT Time Calculation (min) (ACUTE ONLY): 18 min   Charges:   PT Evaluation $PT Eval Low Complexity: 1 Procedure     PT G Codes:   PT G-Codes **NOT FOR INPATIENT CLASS** Functional Assessment Tool Used: Clinical judgement Functional Limitation: Mobility: Walking and moving around Mobility: Walking and Moving Around Current Status (F7510): At least 20 percent but less than 40 percent impaired, limited or restricted Mobility: Walking and Moving Around Goal Status [phone]): At least 1 percent but less than 20 percent impaired, limited or restricted    Pg [phone]   Leandrew Keech 01/31/2017, 3:42 PM

## 2017-01-31 NOTE — Progress Notes (Signed)
PROGRESS NOTE    [name] [name]  KCL:[id number] DOB: [birth year] DOA: 01/29/2017 PCP: [name], [name]   Brief Narrative: [name] [name] is a 71 y.o. male with medical history significant of CVA, HTN, DM. He presented with nausea, vomiting and abdominal pain found to be in DKA and have a rib fracture. Improving on insulin drip   Assessment & Plan:   Principal Problem:   Acute renal failure superimposed on stage 3 chronic kidney disease ([name]) Active Problems:   Uncontrolled type 2 diabetes mellitus with complication ([name])   DKA (diabetic ketoacidoses) ([name])   Dehydration   Rib pain on left side   Acute renal failure on CKD stage III Likely secondary to dehydration from severe hyperglycemia. Resolved with IV fluids -continue IV fluids for DKA  DKA Uncontrolled diabetes mellitus, type 2 Patient slid into DKA overnight. Insulin drip started. -BMP every 4 hours -transition to subcutaneous insulin once gap closed x2 and CO2 improved -add Bicarb drip  Rib pain Possible 11th rib fracture seen on chest x-ray -supportive management   DVT prophylaxis: Lovenox Code Status: Full code Family Communication: None at bedside Disposition Plan: Discharge pending resolution of DKA and transition to subcutaneous insulin. Patient wants discharge to home only   Consultants:   None  Procedures:   Insulin drip (4/20>>  Antimicrobials:  None    Subjective: Persistent left rib pain. Otherwise, no complaints of chest pain, dyspnea, nausea or vomiting.  Objective: Vitals:   01/31/17 0007 01/31/17 0200 01/31/17 0402 01/31/17 0800  BP:  130/65    Pulse:  64    Resp:  19    Temp: 97.8 F (36.6 C)  98.3 F (36.8 C) 98.4 F (36.9 C)  TempSrc: Oral  Oral Oral  SpO2:  96%    Weight:      Height:        Intake/Output Summary (Last 24 hours) at 01/31/17 0902 Last data filed at 01/31/17 4944  Gross per 24 hour  Intake          1219.67 ml  Output             3000 ml  Net          -1780.33 ml   Filed Weights   01/30/17 0600  Weight: 59.8 kg (131 lb 13.4 oz)    Examination:  General exam: Appears calm and comfortable Respiratory system: Clear to auscultation. Respiratory effort normal. Cardiovascular system: S1 & S2 heard, RRR. No murmurs. Gastrointestinal system: Abdomen is nondistended, soft and nontender. Normal bowel sounds heard. Central nervous system: Alert and oriented. No focal neurological deficits. Extremities: No edema. No calf tenderness Skin: No cyanosis. No rashes Psychiatry: Judgement and insight appear normal. Mood & affect appropriate.     Data Reviewed: I have personally reviewed following labs and imaging studies  CBC:  Recent Labs Lab 01/29/17 1539 01/30/17 0307 01/31/17 0406  WBC 12.8* 17.4* 9.6  NEUTROABS 9.5*  --   --   HGB 10.3* 10.6* 9.2*  HCT 30.6* 31.4* 26.8*  MCV 91.9 91.8 91.2  PLT 378 376 967   Basic Metabolic Panel:  Recent Labs Lab 01/30/17 1109 01/30/17 1532 01/30/17 1914 01/31/17 0019 01/31/17 0406  NA 135 134* 135 134* 133*  K 3.6 4.5 4.0 3.9 4.1  CL 113* 110 109 108 108  CO2 19* 15* 15* 18* 16*  GLUCOSE 82 228* 185* 102* 177*  BUN 37* 33* 32* 27* 23*  CREATININE 1.01 1.24 1.19 1.06 1.03  CALCIUM 7.6* 7.8*  8.0* 7.9* 7.8*   GFR: Estimated Creatinine Clearance: 55.6 mL/min (by C-G formula based on SCr of 1.03 mg/dL). Liver Function Tests: No results for input(s): AST, ALT, ALKPHOS, BILITOT, PROT, ALBUMIN in the last 168 hours. No results for input(s): LIPASE, AMYLASE in the last 168 hours. No results for input(s): AMMONIA in the last 168 hours. Coagulation Profile: No results for input(s): INR, PROTIME in the last 168 hours. Cardiac Enzymes: No results for input(s): CKTOTAL, CKMB, CKMBINDEX, TROPONINI in the last 168 hours. BNP (last 3 results) No results for input(s): PROBNP in the last 8760 hours. HbA1C:  Recent Labs  01/29/17 1150  HGBA1C 10.2   CBG:  Recent Labs Lab 01/31/17 0431  01/31/17 0531 01/31/17 0633 01/31/17 0738 01/31/17 0844  GLUCAP 191* 182* 156* 117* 166*   Lipid Profile: No results for input(s): CHOL, HDL, LDLCALC, TRIG, CHOLHDL, LDLDIRECT in the last 72 hours. Thyroid Function Tests: No results for input(s): TSH, T4TOTAL, FREET4, T3FREE, THYROIDAB in the last 72 hours. Anemia Panel: No results for input(s): VITAMINB12, FOLATE, FERRITIN, TIBC, IRON, RETICCTPCT in the last 72 hours. Sepsis Labs: No results for input(s): PROCALCITON, LATICACIDVEN in the last 168 hours.  Recent Results (from the past 240 hour(s))  MRSA PCR Screening     Status: None   Collection Time: 01/30/17  5:50 AM  Result Value Ref Range Status   MRSA by PCR NEGATIVE NEGATIVE Final    Comment:        The GeneXpert MRSA Assay (FDA approved for NASAL specimens only), is one component of a comprehensive MRSA colonization surveillance program. It is not intended to diagnose MRSA infection nor to guide or monitor treatment for MRSA infections.          Radiology Studies: Dg Chest 2 View  Result Date: 01/29/2017 CLINICAL DATA:  Left rib pain. No known injury. History of diabetes and hypertension. EXAM: CHEST  2 VIEW COMPARISON:  12/28/2016 FINDINGS: Mild irregularity of the posterior left eleventh rib. In the setting of rib pain, this could indicate a fracture. Shallow inspiration. Normal heart size and pulmonary vascularity. No focal airspace disease or consolidation in the lungs. No blunting of costophrenic angles. No pneumothorax. Mild wedge deformities of mid thoracic vertebrae without change since 07/05/2016 IMPRESSION: Possible left posterior eleventh rib fracture. No evidence of active pulmonary disease. Electronically Signed   By: Lucienne Capers M.D.   On: 01/29/2017 21:48        Scheduled Meds: . amLODipine  10 mg Oral Daily  . aspirin EC  81 mg Oral Daily  . carvedilol  6.25 mg Oral BID WC  . enoxaparin (LOVENOX) injection  40 mg Subcutaneous QHS  .  feeding supplement (ENSURE ENLIVE)  237 mL Oral BID BM  . FLUoxetine  40 mg Oral Daily   Continuous Infusions: . sodium chloride    . dextrose 5 % and 0.45% NaCl 125 mL/hr at 01/31/17 0610  . insulin (NOVOLIN-R) infusion 0.6 Units/hr (01/31/17 0739)  .  sodium bicarbonate  infusion 1000 mL       LOS: 0 days     [name] Poche, MD [facility] 01/31/2017, 9:02 AM Pager: [phone]  If 7PM-7AM, please contact night-coverage [email or web address] Password [email or web address] 01/31/2017, 9:02 AM

## 2017-02-01 LAB — GLUCOSE, CAPILLARY
Glucose-Capillary: 115 mg/dL — ABNORMAL HIGH (ref 65–99)
Glucose-Capillary: 120 mg/dL — ABNORMAL HIGH (ref 65–99)
Glucose-Capillary: 144 mg/dL — ABNORMAL HIGH (ref 65–99)
Glucose-Capillary: 163 mg/dL — ABNORMAL HIGH (ref 65–99)
Glucose-Capillary: 74 mg/dL (ref 65–99)
Glucose-Capillary: 84 mg/dL (ref 65–99)
Glucose-Capillary: 88 mg/dL (ref 65–99)
Glucose-Capillary: 95 mg/dL (ref 65–99)

## 2017-02-01 LAB — BASIC METABOLIC PANEL
Anion gap: 8 (ref 5–15)
BUN: 19 mg/dL (ref 6–20)
CO2: 24 mmol/L (ref 22–32)
Calcium: 7.7 mg/dL — ABNORMAL LOW (ref 8.9–10.3)
Chloride: 103 mmol/L (ref 101–111)
Creatinine, Ser: 0.99 mg/dL (ref 0.61–1.24)
GFR calc Af Amer: >60 mL/min (ref >60)
GFR calc non Af Amer: >60 mL/min (ref >60)
Glucose, Bld: 124 mg/dL — ABNORMAL HIGH (ref 65–99)
Potassium: 3.8 mmol/L (ref 3.5–5.1)
Sodium: 135 mmol/L (ref 135–145)

## 2017-02-01 MED ORDER — INSULIN ASPART 100 UNIT/ML ~~LOC~~ SOLN
0.0000 [IU] | Freq: Three times a day (TID) | SUBCUTANEOUS | Status: DC
  Administered 2017-02-01: 2 [IU] via SUBCUTANEOUS
  Administered 2017-02-01: 3 [IU] via SUBCUTANEOUS

## 2017-02-01 MED ORDER — ENSURE ENLIVE PO LIQD: 237.0000 mL | Freq: Two times a day (BID) | ORAL | 0 refills | Status: DC

## 2017-02-01 MED ORDER — GLUCERNA SHAKE PO LIQD
237.0000 mL | Freq: Two times a day (BID) | ORAL | Status: DC
  Filled 2017-02-01: qty 237

## 2017-02-01 MED ORDER — INSULIN ASPART 100 UNIT/ML ~~LOC~~ SOLN: 0.0000 [IU] | Freq: Every day | SUBCUTANEOUS | Status: DC

## 2017-02-01 MED ORDER — GLUCERNA SHAKE PO LIQD: 237.0000 mL | Freq: Two times a day (BID) | ORAL | 0 refills | Status: DC

## 2017-02-01 MED ORDER — HYDROCODONE-ACETAMINOPHEN 5-325 MG PO TABS: 1.0000 | ORAL_TABLET | Freq: Four times a day (QID) | ORAL | 0 refills | Status: DC | PRN

## 2017-02-01 NOTE — Care Management Note (Signed)
Case Management Note  Patient Details  Name: [name] [name] MRN: [id number] Date of Birth: 12/20/1945  Subjective/Objective:     CKD,  DM, DKA           Action/Plan: Discharge Planning: NCM spoke to pt and gave permission to speak with dtr, Karna Christmas [phone]. NCM spoke pt and offered choice for [facility]. Pt is active with [facility]. Contacted [facility] for resumption of care. Pt states he has RW at home. Spoke to dtr, Terri. States pt is refusing SNF and long term placement. He wants to be at home. States she comes in the morning to give him his meds and check blood sugar and after work. She was going to see if pt would agreeable to paying privately for aide to come during the day. Will give list to pt for private duty agencies to give to daughter. States pt has option to stay with her but he is refusing.   PCP Sedalia Muta MD   Expected Discharge Date:  02/01/17               Expected Discharge Plan:  [place]  In-House Referral:  NA  Discharge planning Services  CM Consult  Post Acute Care Choice:  Home Health, Resumption of Svcs/PTA Provider Choice offered to:  Patient  DME Arranged:  N/A DME Agency:  NA  HH Arranged:  RN, PT [place] Agency:  [facility] ([facility]  Status of Service:  Completed, signed off  If discussed at [employer], dates discussed:    Additional Comments:  Erenest Rasher, RN 02/01/2017, 2:06 PM

## 2017-02-01 NOTE — Discharge Summary (Signed)
Physician Discharge Summary  [name] [name] XTG:[id number] DOB: Mar 07, 1946 DOA: 01/29/2017  PCP: [name], [name]  Admit date: 01/29/2017 Discharge date: 02/01/2017  Admitted From: Home Disposition: Home  Recommendations for Outpatient Follow-up:  1. Follow up with PCP in 2 days 2. Continue to adjust insulin regimen to achieve optimal A1C  Home Health: Physical therapy and nursing Equipment/Devices: None  Discharge Condition: Stable CODE STATUS: Full code Diet recommendation: Carb modified   Brief/Interim Summary:  Admission HPI written by Karmen Bongo, MD   Chief Complaint: n/v/abd pain  HPI: [name] [name] is a 71 y.o. male with medical history significant of CVA, HTN, DM - last A1c 10+ 7 days ago presenting with n/v/abdominal pain.  He reports "My daughter brought me to the ER.  She said I was throwing up and I was complaining about hurting in the stomach".  Still with some abdominal pain, right under the ribcage on the left.  He fell yesterday and thinks that is the source of his pain.  Last emesis was this afternoon while still at home.  Sugars "so far they're doing pretty good".  However, the machine said it was "high" when he came in, unsure why.  There is a note from his PCP today; he was establishing care.  She reports that his "daughter did not give him Levemir as she was afraid it would drop his blood pressure."  He was previously hospitalized from 4/6-10 for acute encephalopathy and sepsis due to a Klebsiella UTI; and from 3/18-20 for DKA (CO2 on that admission was 10 and glucose was 820, with creatinine 4.18).  He was hospitalized from 3/3-7 for hypoglycemia due to accidental insulin overdose and from 2/4-8/18 and 12/20-26/17 for DKA .   ED Course: Concern for DKA but unable to hydrate patient or start insulin due to recurrent loss of IV access    Hospital course:  Acute renal failure on CKD stage III Secondary to dehydration from severe hyperglycemia. Resolved  with IV fluids and remained stable at baseline.  DKA Uncontrolled diabetes mellitus, type 2 Patient slid into DKA on the night of admission. He was given a total of 50 units of insulin aspart for blood sugars in the 700s. Insulin drip started with improvement of bicarbonate and anion gap but a few episodes of hypoglycemia secondary to previous insulin aspart. He was quickly transitioned back to subcutaneous insulin with sustained improvement of blood sugar and acidosis/anion gap. Continue previous regimen as an outpatient. Home nursing to help with medication administration and continued teaching.  Rib pain Possible 11th rib fracture seen on chest x-ray. Analgesics prn for pain management.  Discharge Diagnoses:  Principal Problem:   Acute renal failure superimposed on stage 3 chronic kidney disease ([name]) Active Problems:   Uncontrolled type 2 diabetes mellitus with complication ([name])   DKA (diabetic ketoacidoses) ([name])   Dehydration   Rib pain on left side   DKA, type 2 [facility])    Discharge Instructions  Discharge Instructions    Call MD for:  difficulty breathing, headache or visual disturbances    Complete by:  As directed    Call MD for:  extreme fatigue    Complete by:  As directed    Call MD for:  persistant nausea and vomiting    Complete by:  As directed      Allergies as of 02/01/2017   No Known Allergies     Medication List    TAKE these medications   acetaminophen 325 MG tablet Commonly known  as:  TYLENOL Take 2 tablets (650 mg total) by mouth every 6 (six) hours as needed for mild pain (or Fever >/= 101).   amLODipine 10 MG tablet Commonly known as:  NORVASC Take 1 tablet (10 mg total) by mouth daily.   aspirin 81 MG EC tablet Take 1 tablet (81 mg total) by mouth daily.   blood glucose meter kit and supplies Dispense based on patient and insurance preference. Use up to four times daily as directed. (FOR ICD-9 250.00, 250.01).   carvedilol 6.25 MG  tablet Commonly known as:  COREG Take 1 tablet (6.25 mg total) by mouth 2 (two) times daily with a meal.   feeding supplement (ENSURE ENLIVE) Liqd Take 237 mLs by mouth 2 (two) times daily between meals.   FLUoxetine 40 MG capsule Commonly known as:  PROZAC Take 1 capsule (40 mg total) by mouth daily.   HYDROcodone-acetaminophen 5-325 MG tablet Commonly known as:  NORCO/VICODIN Take 1 tablet by mouth every 6 (six) hours as needed for moderate pain or severe pain.   insulin aspart 100 UNIT/ML FlexPen Commonly known as:  NOVOLOG FLEXPEN 0-15 Units, Subcutaneous, 3 times daily with meals CBG < 70: implement hypoglycemia protocol CBG 70 - 120: 0 units CBG 121 - 150: 2 units CBG 151 - 200: 3 units CBG 201 - 250: 5 units CBG 251 - 300: 8 units CBG 301 - 350: 11 units CBG 351 - 400: 15 units CBG > 400: call MD   Insulin Detemir 100 UNIT/ML Pen Commonly known as:  LEVEMIR Inject 20 Units into the skin daily at 10 pm.   Insulin Pen Needle 31G X 5 MM Misc Use as needed to inject insulin as prescribed   vitamin B-12 1000 MCG tablet Commonly known as:  CYANOCOBALAMIN Take 1 tablet (1,000 mcg total) by mouth daily.      Follow-up Information    [name], [name]. Schedule an appointment as soon as possible for a visit in 2 day(s).   Specialty:  Family Medicine Contact information: [place] [address] [phone]          No Known Allergies  Consultations:  None   Procedures/Studies: Dg Chest 2 View  Result Date: 01/29/2017 CLINICAL DATA:  Left rib pain. No known injury. History of diabetes and hypertension. EXAM: CHEST  2 VIEW COMPARISON:  12/28/2016 FINDINGS: Mild irregularity of the posterior left eleventh rib. In the setting of rib pain, this could indicate a fracture. Shallow inspiration. Normal heart size and pulmonary vascularity. No focal airspace disease or consolidation in the lungs. No blunting of costophrenic angles. No pneumothorax. Mild wedge  deformities of mid thoracic vertebrae without change since 07/05/2016 IMPRESSION: Possible left posterior eleventh rib fracture. No evidence of active pulmonary disease. Electronically Signed   By: Lucienne Capers M.D.   On: 01/29/2017 21:48       Subjective: No nausea, vomiting or diarrhea. He has some left rib pain, otherwise, no pain or dyspnea.  Discharge Exam: Vitals:   02/01/17 0800 02/01/17 0900  BP: (!) 110/57 127/71  Pulse: 70 68  Resp: 19 16  Temp: 99 F (37.2 C) 98.5 F (36.9 C)   Vitals:   02/01/17 0600 02/01/17 0720 02/01/17 0800 02/01/17 0900  BP: (!) 148/75 (!) 126/54 (!) 110/57 127/71  Pulse: 70 70 70 68  Resp: 20 (!) _0 Temp:   99 F (37.2 C) 98.5 F (36.9 C)  TempSrc:   Oral Oral  SpO2:  99% 98% 97% 100%  Weight:    59.9 kg (132 lb)  Height:    _0  (1.753 m)    General: Pt is alert, awake, not in acute distress Cardiovascular: RRR, S1/S2 +, no rubs, no gallops Respiratory: CTA bilaterally, no wheezing, no rhonchi Abdominal: Soft, NT, ND, bowel sounds + Extremities: no edema, no cyanosis    The results of significant diagnostics from this hospitalization (including imaging, microbiology, ancillary and laboratory) are listed below for reference.     Microbiology: Recent Results (from the past 240 hour(s))  MRSA PCR Screening     Status: None   Collection Time: 01/30/17  5:50 AM  Result Value Ref Range Status   MRSA by PCR NEGATIVE NEGATIVE Final    Comment:        The GeneXpert MRSA Assay (FDA approved for NASAL specimens only), is one component of a comprehensive MRSA colonization surveillance program. It is not intended to diagnose MRSA infection nor to guide or monitor treatment for MRSA infections.      Labs: BNP (last 3 results) No results for input(s): BNP in the last 8760 hours. Basic Metabolic Panel:  Recent Labs Lab 01/31/17 0848 01/31/17 1213 01/31/17 1601 01/31/17 2005 02/01/17 0349  NA 134* 135 134* 134*  135  K 4.0 3.6 3.6 3.7 3.8  CL 108 109 105 106 103  CO2 18* 20* 19* 21* 24  GLUCOSE 204* 106* 189* 141* 124*  BUN _1 CREATININE 1.02 0.94 1.02 1.13 0.99  CALCIUM 7.9* 8.0* 8.0* 7.7* 7.7*   Liver Function Tests: No results for input(s): AST, ALT, ALKPHOS, BILITOT, PROT, ALBUMIN in the last 168 hours. No results for input(s): LIPASE, AMYLASE in the last 168 hours. No results for input(s): AMMONIA in the last 168 hours. CBC:  Recent Labs Lab 01/29/17 1539 01/30/17 0307 01/31/17 0406  WBC 12.8* 17.4* 9.6  NEUTROABS 9.5*  --   --   HGB 10.3* 10.6* 9.2*  HCT 30.6* 31.4* 26.8*  MCV 91.9 91.8 91.2  PLT 378 376 312   Cardiac Enzymes: No results for input(s): CKTOTAL, CKMB, CKMBINDEX, TROPONINI in the last 168 hours. BNP: Invalid input(s): POCBNP CBG:  Recent Labs Lab 02/01/17 0411 02/01/17 0518 02/01/17 0621 02/01/17 0829 02/01/17 1216  GLUCAP 115* 88 74 163* 144*   D-Dimer No results for input(s): DDIMER in the last 72 hours. Hgb A1c No results for input(s): HGBA1C in the last 72 hours. Lipid Profile No results for input(s): CHOL, HDL, LDLCALC, TRIG, CHOLHDL, LDLDIRECT in the last 72 hours. Thyroid function studies No results for input(s): TSH, T4TOTAL, T3FREE, THYROIDAB in the last 72 hours.  Invalid input(s): FREET3 Anemia work up No results for input(s): VITAMINB12, FOLATE, FERRITIN, TIBC, IRON, RETICCTPCT in the last 72 hours. Urinalysis    Component Value Date/Time   COLORURINE COLORLESS (A) 01/29/2017 1159   APPEARANCEUR CLEAR 01/29/2017 1159   LABSPEC 1.013 01/29/2017 1159   PHURINE 5.0 01/29/2017 1159   GLUCOSEU >=500 (A) 01/29/2017 1159   HGBUR SMALL (A) 01/29/2017 1159   BILIRUBINUR NEGATIVE 01/29/2017 1159   KETONESUR 5 (A) 01/29/2017 1159   PROTEINUR NEGATIVE 01/29/2017 1159   UROBILINOGEN 0.2 01/29/2017 1041   NITRITE NEGATIVE 01/29/2017 1159   LEUKOCYTESUR NEGATIVE 01/29/2017 1159   Sepsis Labs Invalid input(s): PROCALCITONIN,   WBC,  LACTICIDVEN Microbiology Recent Results (from the past 240 hour(s))  MRSA PCR Screening     Status: None   Collection Time: 01/30/17  5:50 AM  Result Value Ref Range Status   MRSA by PCR NEGATIVE NEGATIVE Final    Comment:        The GeneXpert MRSA Assay (FDA approved for NASAL specimens only), is one component of a comprehensive MRSA colonization surveillance program. It is not intended to diagnose MRSA infection nor to guide or monitor treatment for MRSA infections.      Time coordinating discharge: Over 30 minutes  SIGNED:   Cordelia Poche, MD [facility] 02/01/2017, 1:51 PM Pager [phone]  If 7PM-7AM, please contact night-coverage [email or web address] Password [email or web address]

## 2017-02-01 NOTE — Progress Notes (Signed)
Discharge instructions explained to pt. Case manager spoke with pt's daughter,see note. IV dc'ed. Prescriptions given to pt, some were called in to pharmacy. Sister waiting at front door of hospital to take pt home. Discharged  Via wheelchair

## 2017-02-05 ENCOUNTER — Ambulatory Visit: Payer: Medicare HMO | Admitting: Family Medicine

## 2017-03-01 IMAGING — US US RENAL
1 series · 14 of 25 positions shown · non-contrast
Comparison: 03/20/2015

CLINICAL DATA: Acute renal failure, elevated BUN and creatinine,
diabetes mellitus, hypertension

EXAM:
RENAL / URINARY TRACT ULTRASOUND COMPLETE

[Series 1: us renal · 0.22mm/px · 14 of 28 slices shown]
[im 1/28]
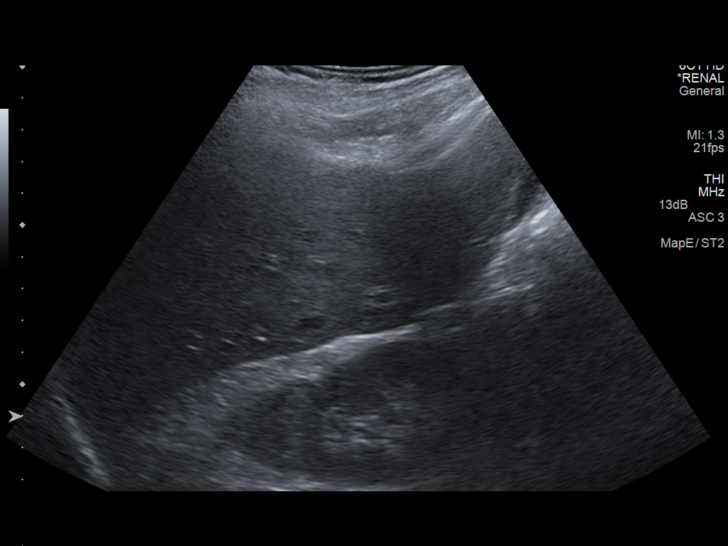
[im 3/28]
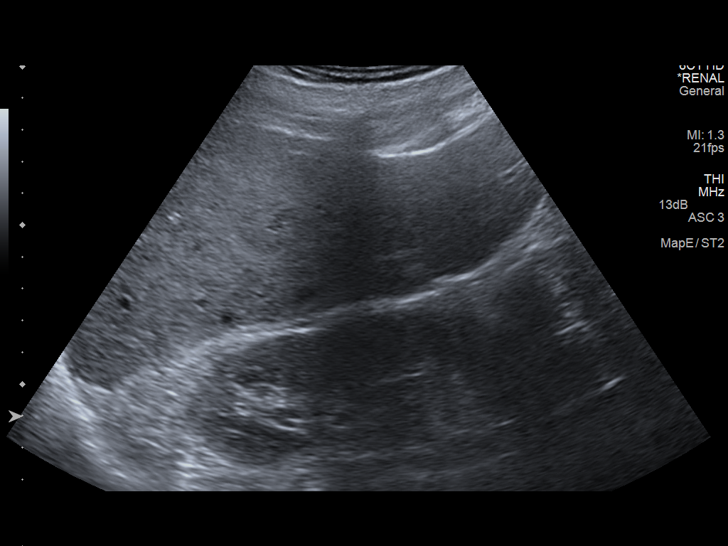
[im 5/28]
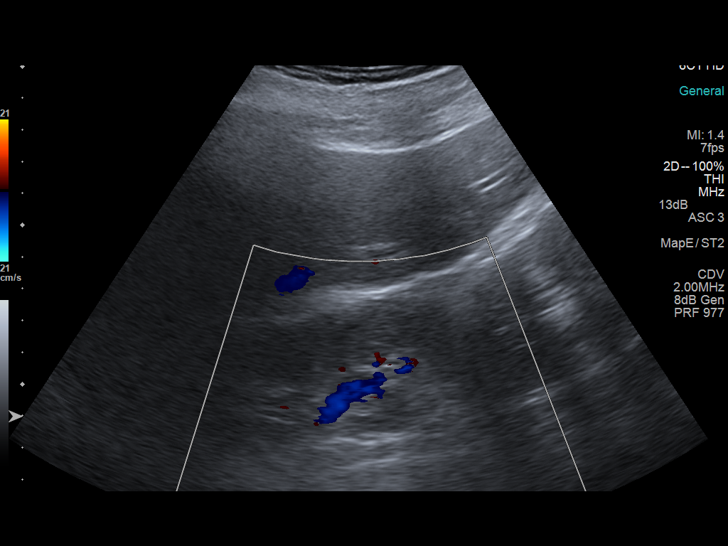
[im 7/28]
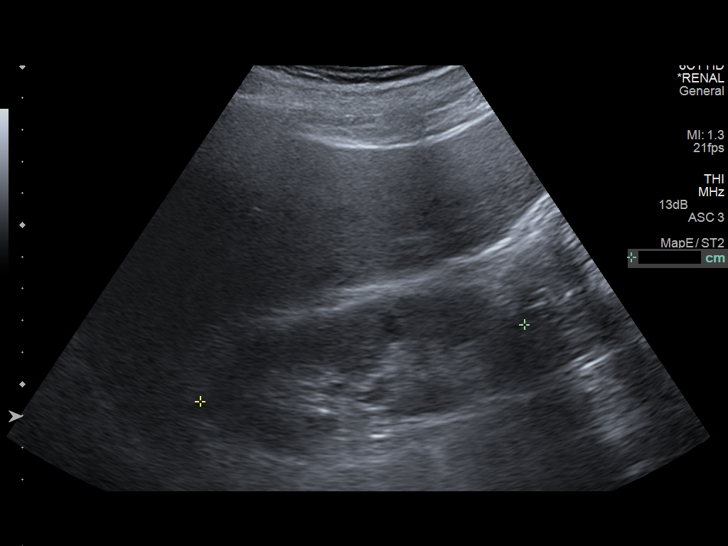
[im 10/28]
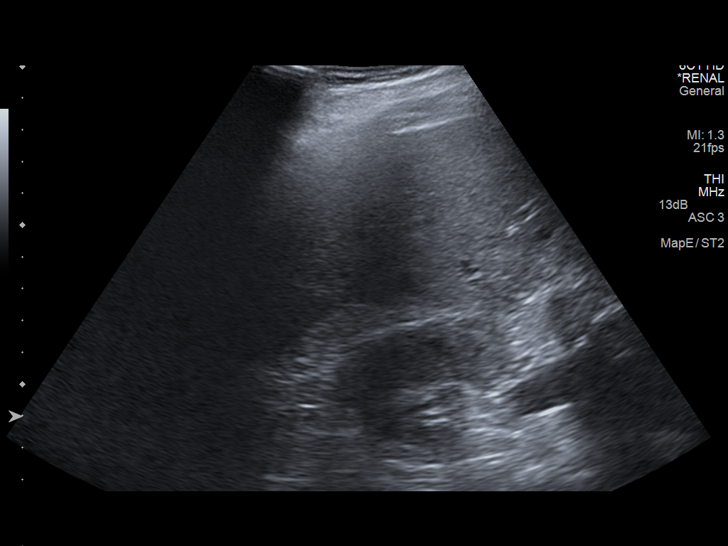
[im 11/28]
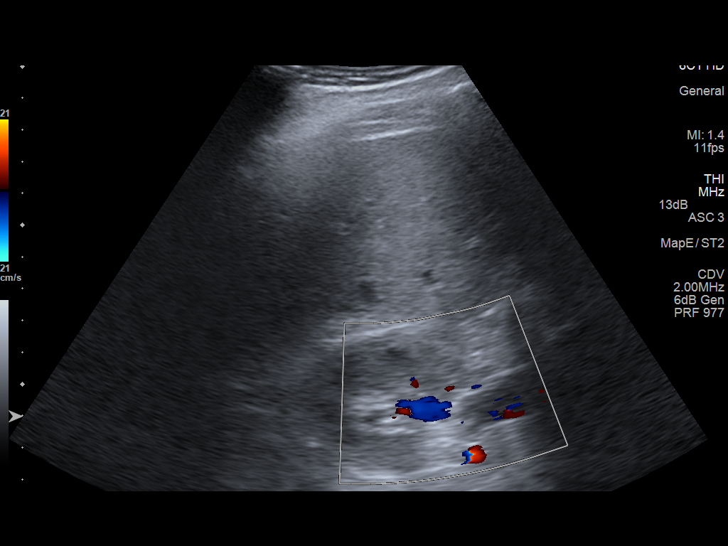
[im 13/28]
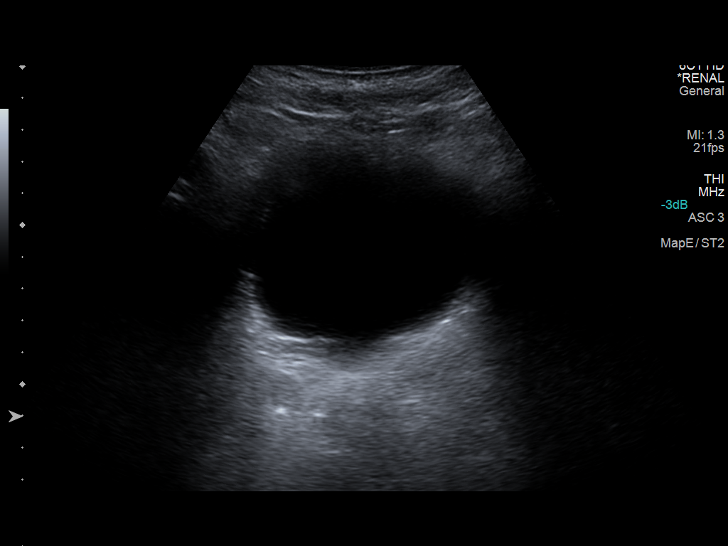
[im 15/28]
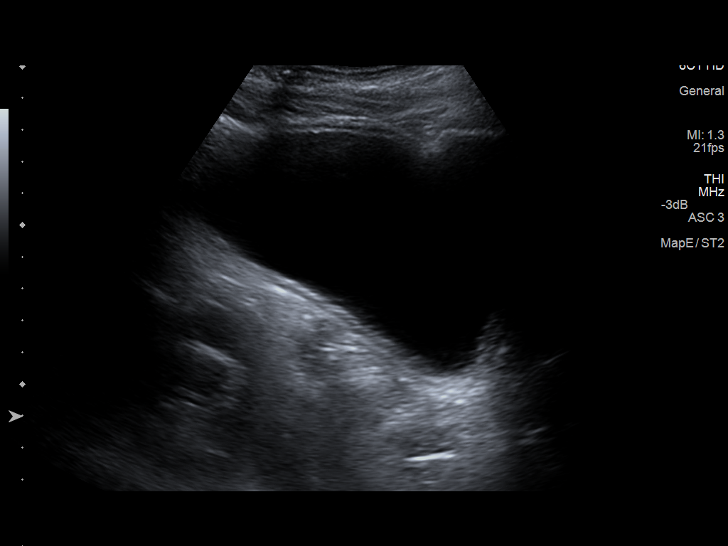
[im 17/28]
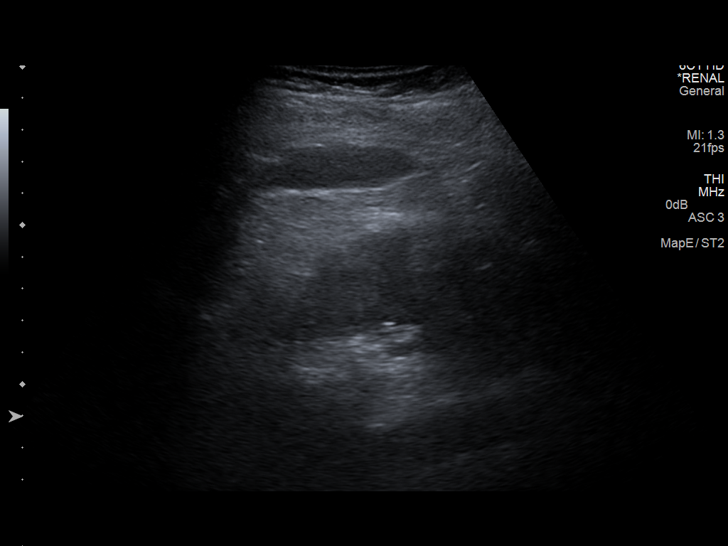
[im 19/28]
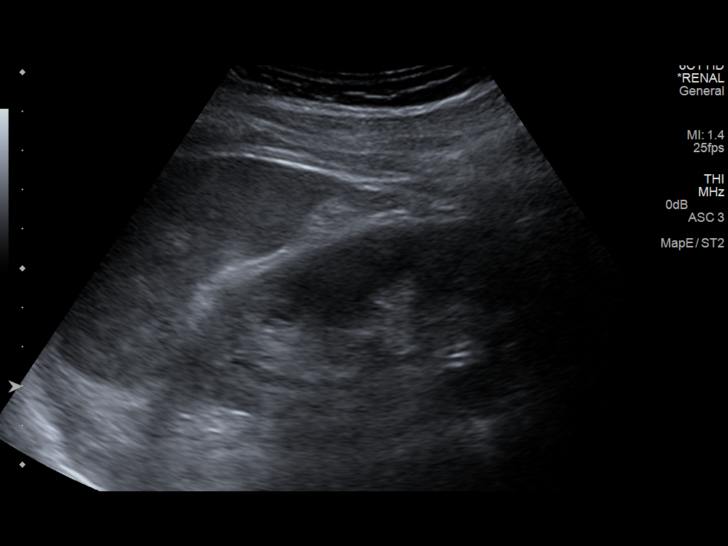
[im 21/28]
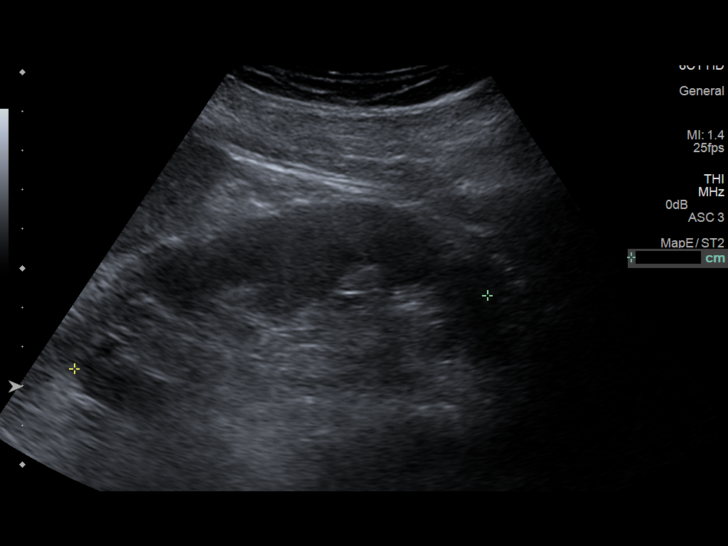
[im 23/28]
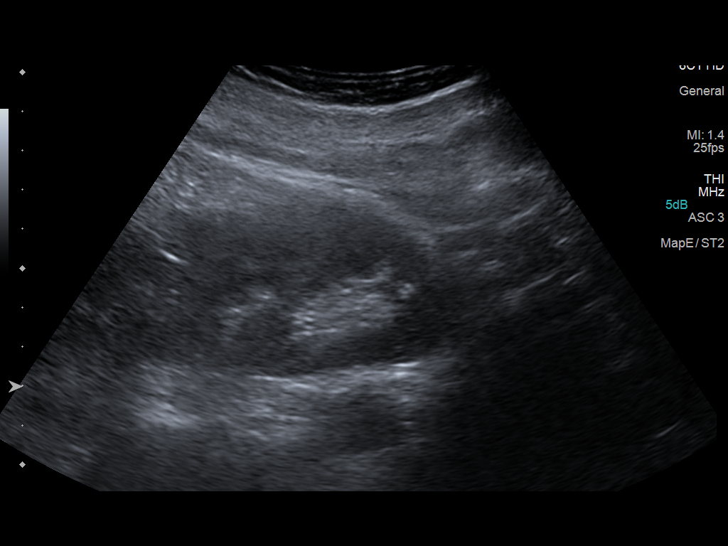
[im 25/28]
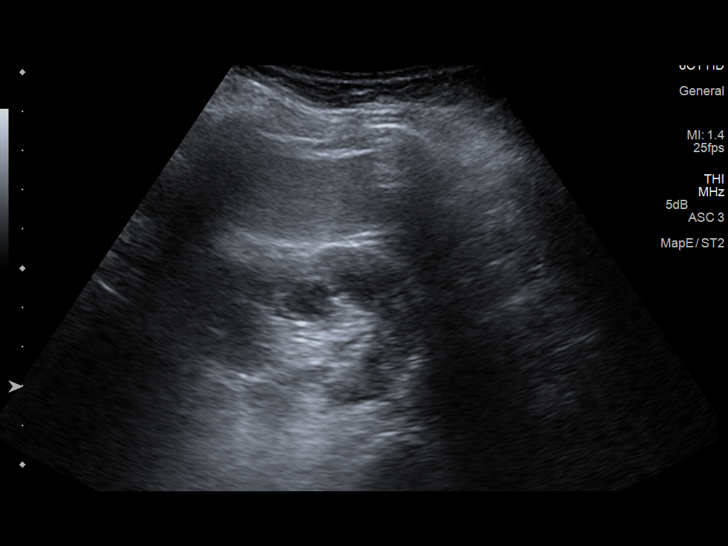
[im 28/28]
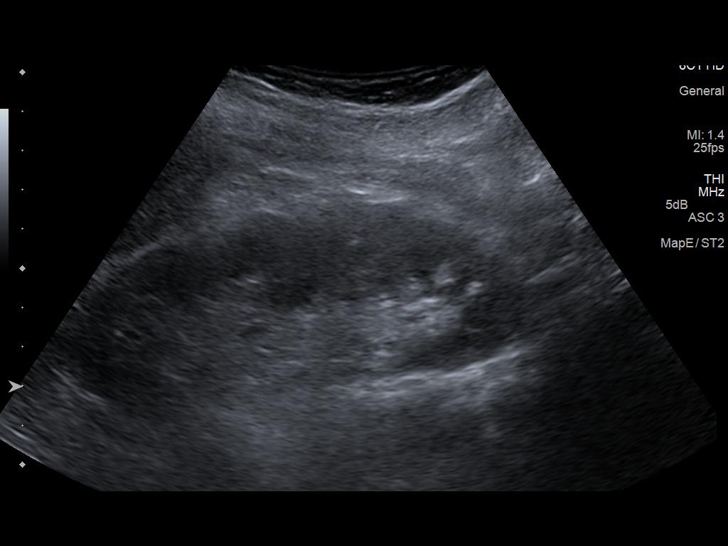

[14 of 25 positions shown; findings below may reference images not displayed]

FINDINGS: Right Kidney:

Length: 10.5 cm. Normal cortical thickness. Upper normal
echogenicity. No mass, hydronephrosis or shadowing calcification.

Left Kidney:

Length: 10.7 cm. Normal cortical thickness. Upper normal cortical
echogenicity. No mass, hydronephrosis or shadowing calcification.
Tiny cyst seen on previous exam not well delineated on current
study.

Bladder:

Appears normal for degree of bladder distention.
IMPRESSION: No evidence of renal mass or hydronephrosis.

## 2017-03-17 DIAGNOSIS — E161 Other hypoglycemia: Secondary | ICD-10-CM | POA: Diagnosis not present

## 2017-03-17 DIAGNOSIS — E162 Hypoglycemia, unspecified: Secondary | ICD-10-CM | POA: Diagnosis not present

## 2017-03-22 ENCOUNTER — Encounter (HOSPITAL_COMMUNITY): Payer: Self-pay

## 2017-03-22 ENCOUNTER — Inpatient Hospital Stay (HOSPITAL_COMMUNITY)
Admission: EM | Admit: 2017-03-22 | Discharge: 2017-03-31 | DRG: 638 | Disposition: A | Discharge status: Home / Self Care | Payer: Medicare Other | Attending: Internal Medicine | Admitting: Internal Medicine

## 2017-03-22 ENCOUNTER — Inpatient Hospital Stay (HOSPITAL_COMMUNITY): Payer: Medicare Other

## 2017-03-22 DIAGNOSIS — Z794 Long term (current) use of insulin: Secondary | ICD-10-CM

## 2017-03-22 DIAGNOSIS — Z9119 Patient's noncompliance with other medical treatment and regimen: Secondary | ICD-10-CM

## 2017-03-22 DIAGNOSIS — E1110 Type 2 diabetes mellitus with ketoacidosis without coma: Principal | ICD-10-CM | POA: Diagnosis present

## 2017-03-22 DIAGNOSIS — D631 Anemia in chronic kidney disease: Secondary | ICD-10-CM | POA: Diagnosis present

## 2017-03-22 DIAGNOSIS — W19XXXA Unspecified fall, initial encounter: Secondary | ICD-10-CM

## 2017-03-22 DIAGNOSIS — M25521 Pain in right elbow: Secondary | ICD-10-CM | POA: Diagnosis not present

## 2017-03-22 DIAGNOSIS — N179 Acute kidney failure, unspecified: Secondary | ICD-10-CM | POA: Diagnosis present

## 2017-03-22 DIAGNOSIS — E1122 Type 2 diabetes mellitus with diabetic chronic kidney disease: Secondary | ICD-10-CM | POA: Diagnosis present

## 2017-03-22 DIAGNOSIS — F17210 Nicotine dependence, cigarettes, uncomplicated: Secondary | ICD-10-CM | POA: Diagnosis present

## 2017-03-22 DIAGNOSIS — R7989 Other specified abnormal findings of blood chemistry: Secondary | ICD-10-CM | POA: Diagnosis not present

## 2017-03-22 DIAGNOSIS — N183 Chronic kidney disease, stage 3 (moderate): Secondary | ICD-10-CM | POA: Diagnosis present

## 2017-03-22 DIAGNOSIS — E875 Hyperkalemia: Secondary | ICD-10-CM | POA: Diagnosis present

## 2017-03-22 DIAGNOSIS — Y92002 Bathroom of unspecified non-institutional (private) residence single-family (private) house as the place of occurrence of the external cause: Secondary | ICD-10-CM

## 2017-03-22 DIAGNOSIS — IMO0001 Reserved for inherently not codable concepts without codable children: Secondary | ICD-10-CM

## 2017-03-22 DIAGNOSIS — N390 Urinary tract infection, site not specified: Secondary | ICD-10-CM | POA: Diagnosis present

## 2017-03-22 DIAGNOSIS — R7309 Other abnormal glucose: Secondary | ICD-10-CM | POA: Diagnosis not present

## 2017-03-22 DIAGNOSIS — E119 Type 2 diabetes mellitus without complications: Secondary | ICD-10-CM | POA: Diagnosis not present

## 2017-03-22 DIAGNOSIS — N185 Chronic kidney disease, stage 5: Secondary | ICD-10-CM | POA: Diagnosis present

## 2017-03-22 DIAGNOSIS — Z7982 Long term (current) use of aspirin: Secondary | ICD-10-CM | POA: Diagnosis not present

## 2017-03-22 DIAGNOSIS — Z66 Do not resuscitate: Secondary | ICD-10-CM | POA: Diagnosis present

## 2017-03-22 DIAGNOSIS — E118 Type 2 diabetes mellitus with unspecified complications: Secondary | ICD-10-CM

## 2017-03-22 DIAGNOSIS — E1310 Other specified diabetes mellitus with ketoacidosis without coma: Secondary | ICD-10-CM | POA: Diagnosis not present

## 2017-03-22 DIAGNOSIS — I129 Hypertensive chronic kidney disease with stage 1 through stage 4 chronic kidney disease, or unspecified chronic kidney disease: Secondary | ICD-10-CM | POA: Diagnosis present

## 2017-03-22 DIAGNOSIS — IMO0002 Reserved for concepts with insufficient information to code with codable children: Secondary | ICD-10-CM

## 2017-03-22 DIAGNOSIS — Z8673 Personal history of transient ischemic attack (TIA), and cerebral infarction without residual deficits: Secondary | ICD-10-CM | POA: Diagnosis not present

## 2017-03-22 DIAGNOSIS — E0810 Diabetes mellitus due to underlying condition with ketoacidosis without coma: Secondary | ICD-10-CM | POA: Diagnosis not present

## 2017-03-22 DIAGNOSIS — B961 Klebsiella pneumoniae [K. pneumoniae] as the cause of diseases classified elsewhere: Secondary | ICD-10-CM | POA: Diagnosis present

## 2017-03-22 DIAGNOSIS — Z96641 Presence of right artificial hip joint: Secondary | ICD-10-CM | POA: Diagnosis present

## 2017-03-22 DIAGNOSIS — W01198A Fall on same level from slipping, tripping and stumbling with subsequent striking against other object, initial encounter: Secondary | ICD-10-CM | POA: Diagnosis present

## 2017-03-22 DIAGNOSIS — E1165 Type 2 diabetes mellitus with hyperglycemia: Secondary | ICD-10-CM

## 2017-03-22 DIAGNOSIS — Z79899 Other long term (current) drug therapy: Secondary | ICD-10-CM | POA: Diagnosis not present

## 2017-03-22 DIAGNOSIS — E11649 Type 2 diabetes mellitus with hypoglycemia without coma: Secondary | ICD-10-CM | POA: Diagnosis present

## 2017-03-22 DIAGNOSIS — Z86718 Personal history of other venous thrombosis and embolism: Secondary | ICD-10-CM | POA: Diagnosis not present

## 2017-03-22 DIAGNOSIS — N170 Acute kidney failure with tubular necrosis: Secondary | ICD-10-CM | POA: Diagnosis not present

## 2017-03-22 DIAGNOSIS — S59901A Unspecified injury of right elbow, initial encounter: Secondary | ICD-10-CM | POA: Diagnosis not present

## 2017-03-22 DIAGNOSIS — R109 Unspecified abdominal pain: Secondary | ICD-10-CM | POA: Diagnosis not present

## 2017-03-22 DIAGNOSIS — E162 Hypoglycemia, unspecified: Secondary | ICD-10-CM | POA: Diagnosis not present

## 2017-03-22 DIAGNOSIS — F1410 Cocaine abuse, uncomplicated: Secondary | ICD-10-CM | POA: Diagnosis not present

## 2017-03-22 LAB — COMPREHENSIVE METABOLIC PANEL
ALT: 26 U/L (ref 17–63)
AST: 26 U/L (ref 15–41)
Albumin: 4.4 g/dL (ref 3.5–5.0)
Alkaline Phosphatase: 120 U/L (ref 38–126)
Anion gap: 19 — ABNORMAL HIGH (ref 5–15)
BUN: 72 mg/dL — ABNORMAL HIGH (ref 6–20)
CO2: 14 mmol/L — ABNORMAL LOW (ref 22–32)
Calcium: 9.5 mg/dL (ref 8.9–10.3)
Chloride: 97 mmol/L — ABNORMAL LOW (ref 101–111)
Creatinine, Ser: 2.89 mg/dL — ABNORMAL HIGH (ref 0.61–1.24)
GFR calc Af Amer: 23 mL/min — ABNORMAL LOW (ref >60)
GFR calc non Af Amer: 20 mL/min — ABNORMAL LOW (ref >60)
Glucose, Bld: 818 mg/dL (ref 65–99)
Potassium: 5.5 mmol/L — ABNORMAL HIGH (ref 3.5–5.1)
Sodium: 130 mmol/L — ABNORMAL LOW (ref 135–145)
Total Bilirubin: 1.2 mg/dL (ref 0.3–1.2)
Total Protein: 8.2 g/dL — ABNORMAL HIGH (ref 6.5–8.1)

## 2017-03-22 LAB — BLOOD GAS, VENOUS
Acid-base deficit: 14.8 mmol/L — ABNORMAL HIGH (ref 0.0–2.0)
Bicarbonate: 13 mmol/L — ABNORMAL LOW (ref 20.0–28.0)
O2 Saturation: 61.6 %
Patient temperature: 98.6
pCO2, Ven: 35.4 mmHg — ABNORMAL LOW (ref 44.0–60.0)
pH, Ven: 7.189 — CL (ref 7.250–7.430)
pO2, Ven: 37.9 mmHg (ref 32.0–45.0)

## 2017-03-22 LAB — GLUCOSE, CAPILLARY
Glucose-Capillary: 161 mg/dL — ABNORMAL HIGH (ref 65–99)
Glucose-Capillary: 193 mg/dL — ABNORMAL HIGH (ref 65–99)
Glucose-Capillary: 234 mg/dL — ABNORMAL HIGH (ref 65–99)

## 2017-03-22 LAB — CBC
HCT: 31.4 % — ABNORMAL LOW (ref 39.0–52.0)
Hemoglobin: 10.3 g/dL — ABNORMAL LOW (ref 13.0–17.0)
MCH: 30.7 pg (ref 26.0–34.0)
MCHC: 32.8 g/dL (ref 30.0–36.0)
MCV: 93.5 fL (ref 78.0–100.0)
Platelets: 226 10*3/uL (ref 150–400)
RBC: 3.36 MIL/uL — ABNORMAL LOW (ref 4.22–5.81)
RDW: 13.5 % (ref 11.5–15.5)
WBC: 10.5 10*3/uL (ref 4.0–10.5)

## 2017-03-22 LAB — CBG MONITORING, ED
Glucose-Capillary: >600 mg/dL (ref 65–99)
Glucose-Capillary: 429 mg/dL — ABNORMAL HIGH (ref 65–99)
Glucose-Capillary: 451 mg/dL — ABNORMAL HIGH (ref 65–99)
Glucose-Capillary: 296 mg/dL — ABNORMAL HIGH (ref 65–99)

## 2017-03-22 LAB — MRSA PCR SCREENING: MRSA by PCR: NEGATIVE

## 2017-03-22 MED ORDER — ASPIRIN 81 MG PO CHEW
81.0000 mg | CHEWABLE_TABLET | Freq: Every day | ORAL | Status: DC
  Administered 2017-03-23 – 2017-03-31 (×9): 81 mg via ORAL
  Filled 2017-03-22 (×9): qty 1

## 2017-03-22 MED ORDER — HYDROCODONE-ACETAMINOPHEN 5-325 MG PO TABS
1.0000 | ORAL_TABLET | Freq: Four times a day (QID) | ORAL | Status: DC | PRN
  Administered 2017-03-27: 1 via ORAL
  Filled 2017-03-22: qty 1

## 2017-03-22 MED ORDER — SODIUM CHLORIDE 0.9 % IV BOLUS (SEPSIS)
1000.0000 mL | Freq: Once | INTRAVENOUS | Status: AC
  Administered 2017-03-22: 1000 mL via INTRAVENOUS

## 2017-03-22 MED ORDER — DEXTROSE-NACL 5-0.45 % IV SOLN
INTRAVENOUS | Status: DC
  Administered 2017-03-23: 11:00:00 via INTRAVENOUS

## 2017-03-22 MED ORDER — SODIUM CHLORIDE 0.9 % IV SOLN
INTRAVENOUS | Status: DC
  Administered 2017-03-22: 3 [IU]/h via INTRAVENOUS
  Administered 2017-03-23: 4.2 [IU]/h via INTRAVENOUS
  Filled 2017-03-22 (×2): qty 1

## 2017-03-22 MED ORDER — FLUOXETINE HCL 20 MG PO CAPS
40.0000 mg | ORAL_CAPSULE | Freq: Every day | ORAL | Status: DC
  Administered 2017-03-23 – 2017-03-31 (×9): 40 mg via ORAL
  Filled 2017-03-22 (×9): qty 2

## 2017-03-22 MED ORDER — CARVEDILOL 6.25 MG PO TABS
6.2500 mg | ORAL_TABLET | Freq: Two times a day (BID) | ORAL | Status: DC
  Administered 2017-03-23 – 2017-03-31 (×17): 6.25 mg via ORAL
  Filled 2017-03-22 (×17): qty 1

## 2017-03-22 MED ORDER — SODIUM CHLORIDE 0.9 % IV SOLN
INTRAVENOUS | Status: DC
  Administered 2017-03-22: 5.4 [IU]/h via INTRAVENOUS
  Filled 2017-03-22: qty 1

## 2017-03-22 MED ORDER — SODIUM CHLORIDE 0.9 % IV SOLN: INTRAVENOUS | Status: DC

## 2017-03-22 MED ORDER — ENOXAPARIN SODIUM 30 MG/0.3ML ~~LOC~~ SOLN
30.0000 mg | SUBCUTANEOUS | Status: DC
  Administered 2017-03-23 – 2017-03-24 (×2): 30 mg via SUBCUTANEOUS
  Filled 2017-03-22 (×2): qty 0.3

## 2017-03-22 MED ORDER — DEXTROSE-NACL 5-0.45 % IV SOLN
INTRAVENOUS | Status: DC
  Administered 2017-03-22: 22:00:00 via INTRAVENOUS

## 2017-03-22 MED ORDER — SODIUM CHLORIDE 0.9 % IV SOLN
INTRAVENOUS | Status: DC
  Administered 2017-03-23 (×2): via INTRAVENOUS

## 2017-03-22 NOTE — ED Provider Notes (Addendum)
[facility] DEPT Provider Note   CSN: [id number] Arrival date & time: 03/22/17  1556     History   Chief Complaint Chief Complaint  Patient presents with  . Hyperglycemia    HPI [name] [name] is a 71 y.o. male.   Hyperglycemia  Blood sugar level PTA:  Error; >500 by EMS Onset quality:  Gradual Duration:  1 day Timing:  Constant Progression:  Unchanged Chronicity:  Chronic Diabetes status:  Controlled with insulin Current diabetic therapy:  Lantus and novolog Context: not change in medication, not new diabetes diagnosis, not noncompliance, not recent change in diet and not recent illness   Relieved by:  Nothing Ineffective treatments:  None tried Associated symptoms: nausea and vomiting (NBNB)   Associated symptoms: no abdominal pain, no chest pain, no fatigue and no fever   Risk factors: hx of DKA     Past Medical History:  Diagnosis Date  . 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 Closed comminuted intertrochanteric fracture of left femur ([place])   . Diabetes mellitus without complication ([place])   . Diabetic hyperosmolar non-ketotic state ([place]) 05/15/2016  . DKA (diabetic ketoacidoses) ([place]) 05/09/2016  . Hypertension   . Postoperative anemia due to acute blood loss 06/18/2016  . Retroperitoneal hematoma 06/18/2016  . Stroke ([place])   . Vitamin B 12 deficiency 06/18/2016    Patient Active Problem List   Diagnosis Date Noted  . DKA, type 2 ([place]) 01/31/2017  . Rib pain on left side 01/29/2017  . Dehydration   . Urinary tract infection without hematuria   . Hyperkalemia 01/16/2017  . Acute metabolic encephalopathy 86/75/4492  . Elevated lactic acid level   . DKA (diabetic ketoacidoses) ([place]) 12/28/2016  . AKI (acute kidney injury) ([place]) 12/14/2016  . Hypoglycemia   . Sepsis ([place]) 12/13/2016  . Uncontrolled type 2 diabetes mellitus with complication ([place])   . Encephalopathy, metabolic   . Acute renal failure  superimposed on stage 3 chronic kidney disease ([place])   . Noncompliance with medication regimen 08/17/2016  . History of DVT (deep vein thrombosis) 07/06/2016  . Retroperitoneal hematoma 06/18/2016  . Vitamin B 12 deficiency 06/18/2016  . Hyperglycemia 05/15/2016  . Anxiety 05/15/2016  . History of CVA (cerebrovascular accident) 05/15/2016  . Hypoglycemia, coma ([place]) 11/02/2015  . Narcotic dependency, continuous ([place]) 11/02/2015  . Uncontrolled type 2 diabetes mellitus with diabetic nephropathy, with long-term current use of insulin ([place]) 08/12/2015  . Depression 08/12/2015  . CKD (chronic kidney disease) stage 3, GFR 30-59 ml/min 03/20/2014    Past Surgical History:  Procedure Laterality Date  . HIP ARTHROPLASTY Right 02/05/2013   Procedure: ARTHROPLASTY BIPOLAR HIP;  Surgeon: [name], MD;  Location: [facility];  Service: Orthopedics;  Laterality: Right;  . INTRAMEDULLARY (IM) NAIL INTERTROCHANTERIC Left 06/16/2016   Procedure: INTRAMEDULLARY (IM) NAIL INTERTROCHANTRIC;  Surgeon: Rod Can, MD;  Location: [facility];  Service: Orthopedics;  Laterality: Left;       Home Medications    Prior to Admission medications   Medication Sig Start Date End Date Taking? Authorizing Provider  acetaminophen (TYLENOL) 325 MG tablet Take 2 tablets (650 mg total) by mouth every 6 (six) hours as needed for mild pain (or Fever >/= 101). 01/20/17   Barton Dubois, MD  amLODipine (NORVASC) 10 MG tablet Take 1 tablet (10 mg total) by mouth daily. 01/30/17   [name], [name]  aspirin 81 MG EC tablet Take 1 tablet (81 mg total) by mouth  daily. 01/29/17   [name], [name]  blood glucose meter kit and supplies Dispense based on patient and insurance preference. Use up to four times daily as directed. (FOR ICD-9 250.00, 250.01). 01/20/17   Barton Dubois, MD  carvedilol (COREG) 6.25 MG tablet Take 1 tablet (6.25 mg total) by mouth 2 (two) times daily with a meal. 01/30/17   [name], [name]    feeding supplement, GLUCERNA SHAKE, (GLUCERNA SHAKE) LIQD Take 237 mLs by mouth 2 (two) times daily between meals. 02/01/17   Mariel Aloe, MD  FLUoxetine (PROZAC) 40 MG capsule Take 1 capsule (40 mg total) by mouth daily. 01/30/17   [name], [name]  HYDROcodone-acetaminophen (NORCO/VICODIN) 5-325 MG tablet Take 1 tablet by mouth every 6 (six) hours as needed for moderate pain or severe pain. 02/01/17   Mariel Aloe, MD  insulin aspart (NOVOLOG FLEXPEN) 100 UNIT/ML FlexPen 0-15 Units, Subcutaneous, 3 times daily with meals CBG < 70: implement hypoglycemia protocol CBG 70 - 120: 0 units CBG 121 - 150: 2 units CBG 151 - 200: 3 units CBG 201 - 250: 5 units CBG 251 - 300: 8 units CBG 301 - 350: 11 units CBG 351 - 400: 15 units CBG > 400: call MD 01/29/17   [name], [name]  Insulin Detemir (LEVEMIR) 100 UNIT/ML Pen Inject 20 Units into the skin daily at 10 pm. 01/30/17   [name], [name]  Insulin Pen Needle 31G X 5 MM MISC Use as needed to inject insulin as prescribed 01/20/17   Barton Dubois, MD  vitamin B-12 (CYANOCOBALAMIN) 1000 MCG tablet Take 1 tablet (1,000 mcg total) by mouth daily. 01/20/17   Barton Dubois, MD    Family History Family History  Problem Relation Age of Onset  . Diabetes Mother   . Alzheimer's disease Mother   . Hypertension Mother   . Hyperlipidemia Father   . Hypertension Father   . Healthy Maternal Grandmother   . Pneumonia Maternal Grandfather     Social History Social History  Substance Use Topics  . Smoking status: Former Smoker    Packs/day: 0.25    Years: 30.00    Types: Cigarettes    Quit date: 01/31/2013  . Smokeless tobacco: Never Used  . Alcohol use No     Allergies   Patient has no known allergies.   Review of Systems Review of Systems  Constitutional: Negative for fatigue and fever.  Cardiovascular: Negative for chest pain.  Gastrointestinal: Positive for nausea and vomiting (NBNB). Negative for abdominal pain.   All other systems are reviewed and are negative for acute change except as noted in the HPI    Physical Exam Updated Vital Signs BP 124/73 (BP Location: Left Arm)   Pulse 64   Temp 97.6 F (36.4 C) (Oral)   Resp 20   SpO2 99%   Physical Exam  Constitutional: He is oriented to person, place, and time. He appears well-developed and well-nourished. No distress.  HENT:  Head: Normocephalic and atraumatic.  Nose: Nose normal.  Eyes: Conjunctivae and EOM are normal. Pupils are equal, round, and reactive to light. Right eye exhibits no discharge. Left eye exhibits no discharge. No scleral icterus.  Neck: Normal range of motion. Neck supple.  Cardiovascular: Normal rate and regular rhythm.  Exam reveals no gallop and no friction rub.   No murmur heard. Pulmonary/Chest: Effort normal and breath sounds normal. No stridor. No respiratory distress. He has no rales.  Abdominal: Soft. He  exhibits no distension. There is no tenderness.  Musculoskeletal: He exhibits no edema or tenderness.  Neurological: He is alert and oriented to person, place, and time.  Skin: Skin is warm and dry. No rash noted. He is not diaphoretic. No erythema.  Psychiatric: He has a normal mood and affect.  Vitals reviewed.    ED Treatments / Results  Labs (all labs ordered are listed, but only abnormal results are displayed) Labs Reviewed  CBC - Abnormal; Notable for the following:       Result Value   RBC 3.36 (*)    Hemoglobin 10.3 (*)    HCT 31.4 (*)    All other components within normal limits  BLOOD GAS, VENOUS - Abnormal; Notable for the following:    pH, Ven 7.189 (*)    pCO2, Ven 35.4 (*)    Bicarbonate 13.0 (*)    Acid-base deficit 14.8 (*)    All other components within normal limits  COMPREHENSIVE METABOLIC PANEL - Abnormal; Notable for the following:    Sodium 130 (*)    Potassium 5.5 (*)    Chloride 97 (*)    CO2 14 (*)    Glucose, Bld 818 (*)    BUN 72 (*)    Creatinine, Ser 2.89 (*)     Total Protein 8.2 (*)    GFR calc non Af Amer 20 (*)    GFR calc Af Amer 23 (*)    Anion gap 19 (*)    All other components within normal limits  CBG MONITORING, ED - Abnormal; Notable for the following:    Glucose-Capillary >600 (*)    All other components within normal limits  URINALYSIS, ROUTINE W REFLEX MICROSCOPIC    EKG  EKG Interpretation  Date/Time:  Sunday March 22 2017 18:37:35 EDT Ventricular Rate:  64 PR Interval:    QRS Duration: 102 QT Interval:  430 QTC Calculation: 444 R Axis:   -60 Text Interpretation:  Sinus rhythm Left anterior fascicular block Baseline wander in lead(s) II III aVF No significant change since last tracing Confirmed by Addison Lank [phone]) on 03/22/2017 6:46:44 PM       Radiology No results found.  Procedures Procedures (including critical care time) CRITICAL CARE Performed by: Grayce Sessions Cardama Total critical care time: 35 minutes Critical care time was exclusive of separately billable procedures and treating other patients. Critical care was necessary to treat or prevent imminent or life-threatening deterioration. Critical care was time spent personally by me on the following activities: development of treatment plan with patient and/or surrogate as well as nursing, discussions with consultants, evaluation of patient's response to treatment, examination of patient, obtaining history from patient or surrogate, ordering and performing treatments and interventions, ordering and review of laboratory studies, ordering and review of radiographic studies, pulse oximetry and re-evaluation of patient's condition.   Medications Ordered in ED Medications  sodium chloride 0.9 % bolus 1,000 mL (not administered)  insulin regular (NOVOLIN R,HUMULIN R) 100 Units in sodium chloride 0.9 % 100 mL (1 Units/mL) infusion (not administered)  sodium chloride 0.9 % bolus 1,000 mL (not administered)    And  0.9 %  sodium chloride infusion (not  administered)  dextrose 5 %-0.45 % sodium chloride infusion (not administered)     Initial Impression / Assessment and Plan / ED Course  I have reviewed the triage vital signs and the nursing notes.  Pertinent labs & imaging results that were available during my care of the patient were reviewed by  me and considered in my medical decision making (see chart for details).  Clinical Course as of Mar 22 1916  Sun Mar 22, 2017  1807 Presentation consistent with DKA. Potassium greater than 3.5. Patient started on IV fluids and insulin drip. Will require admission for further management.  In addition the patient also noted to have acute renal insufficiency with a creatinine of 2.8 and BUN of 72.  Will discuss case with medicine for admission.  [PC]    Clinical Course User Index [PC] Cardama, Grayce Sessions, MD      Final Clinical Impressions(s) / ED Diagnoses   Final diagnoses:  Diabetic ketoacidosis without coma associated with type 2 diabetes mellitus ([place])  AKI (acute kidney injury) ([place])  Hyperkalemia      Cardama, Grayce Sessions, MD 03/22/17 1918

## 2017-03-22 NOTE — ED Notes (Signed)
Bed: PJ09 Expected date:  Expected time:  Means of arrival:  Comments: 71 yo hyperglycemia

## 2017-03-22 NOTE — H&P (Signed)
History and Physical    Aldwin Micalizzi MPN:[id number] DOB: [birth year] DOA: 03/22/2017  PCP: [name], [name] Consultants:  None Patient coming from: Home - lives alone but his daughter comes and stay with him sometimes; NOK: daughter  Chief Complaint: hyperglycemia  HPI: [name] [name] is a 71 y.o. male with medical history significant of CVA, HTN, DM, and last hospitalization for DKA from 4/19 (admitted by me) through 4/22.  He reports "I took too much insulin yesterday.  I forgot what I take."  When asked why taking too much insulin would lead to elevated blood sugars, he is not sure why his sugar is high.  Denies somnolence, but acknowledges mild confusion.  He reports that this is the first time this has happened.  R elbow pain and abdominal pain after a fall yesterday (hit the shower head).  No urinary symptoms. He reports he was feeling fine until today or yesterday.  He was previously hospitalized from 4/19-22 for DKA and acute renal failure;  4/6-10 for acute encephalopathy and sepsis due to a Klebsiella UTI; and from 3/18-20 for DKA (CO2 on that admission was 10 and glucose was 820, with creatinine 4.18).  He was hospitalized from 3/3-7 for hypoglycemia due to accidental insulin overdose and from 2/4-8/18 and 12/20-26/17 for DKA.  . ED Course: DKA - K > 3.5; started on IVF and insulin drip.  ARF with BUN 72/Cr 3.8.  Review of Systems: As per HPI; otherwise review of systems reviewed and negative.   Ambulatory Status:  Ambulates without assistance  Past Medical History:  Diagnosis Date  . 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 Closed comminuted intertrochanteric fracture of left femur ([place])   . Diabetes mellitus without complication ([place])   . Diabetic hyperosmolar non-ketotic state ([place]) 05/15/2016  . DKA (diabetic ketoacidoses) ([place]) 05/09/2016  . Hypertension   . Postoperative anemia due to acute blood loss 06/18/2016  .  Retroperitoneal hematoma 06/18/2016  . Stroke ([place])   . Vitamin B 12 deficiency 06/18/2016    Past Surgical History:  Procedure Laterality Date  . HIP ARTHROPLASTY Right 02/05/2013   Procedure: ARTHROPLASTY BIPOLAR HIP;  Surgeon: [name], MD;  Location: [facility];  Service: Orthopedics;  Laterality: Right;  . INTRAMEDULLARY (IM) NAIL INTERTROCHANTERIC Left 06/16/2016   Procedure: INTRAMEDULLARY (IM) NAIL INTERTROCHANTRIC;  Surgeon: Rod Can, MD;  Location: [facility];  Service: Orthopedics;  Laterality: Left;    Social History   Social History  . Marital status: Single    Spouse name: N/A  . Number of children: 2  . Years of education: 13   Occupational History  . Retired    Social History Main Topics  . Smoking status: Current Some Day Smoker    Packs/day: 0.25    Years: 30.00    Types: Cigarettes    Last attempt to quit: 01/31/2013  . Smokeless tobacco: Never Used     Comment: "some days I light one", requests a nicotine patch  . Alcohol use No  . Drug use: No  . Sexual activity: No   Other Topics Concern  . Not on file   Social History Narrative   Fun: Likes to do handyman related projects, photography.    Denies religious beliefs effecting health care.     No Known Allergies  Family History  Problem Relation Age of Onset  . Diabetes Mother   . Alzheimer's disease Mother   . Hypertension Mother   .  Hyperlipidemia Father   . Hypertension Father   . Healthy Maternal Grandmother   . Pneumonia Maternal Grandfather     Prior to Admission medications   Medication Sig Start Date End Date Taking? Authorizing Provider  acetaminophen (TYLENOL) 325 MG tablet Take 2 tablets (650 mg total) by mouth every 6 (six) hours as needed for mild pain (or Fever >/= 101). 01/20/17   Barton Dubois, MD  amLODipine (NORVASC) 10 MG tablet Take 1 tablet (10 mg total) by mouth daily. 01/30/17   [name], [name]  aspirin 81 MG EC tablet Take 1 tablet (81 mg total) by mouth  daily. 01/29/17   [name], [name]  blood glucose meter kit and supplies Dispense based on patient and insurance preference. Use up to four times daily as directed. (FOR ICD-9 250.00, 250.01). 01/20/17   Barton Dubois, MD  carvedilol (COREG) 6.25 MG tablet Take 1 tablet (6.25 mg total) by mouth 2 (two) times daily with a meal. 01/30/17   [name], [name]  feeding supplement, GLUCERNA SHAKE, (GLUCERNA SHAKE) LIQD Take 237 mLs by mouth 2 (two) times daily between meals. 02/01/17   Mariel Aloe, MD  FLUoxetine (PROZAC) 40 MG capsule Take 1 capsule (40 mg total) by mouth daily. 01/30/17   [name], [name]  HYDROcodone-acetaminophen (NORCO/VICODIN) 5-325 MG tablet Take 1 tablet by mouth every 6 (six) hours as needed for moderate pain or severe pain. 02/01/17   Mariel Aloe, MD  insulin aspart (NOVOLOG FLEXPEN) 100 UNIT/ML FlexPen 0-15 Units, Subcutaneous, 3 times daily with meals CBG < 70: implement hypoglycemia protocol CBG 70 - 120: 0 units CBG 121 - 150: 2 units CBG 151 - 200: 3 units CBG 201 - 250: 5 units CBG 251 - 300: 8 units CBG 301 - 350: 11 units CBG 351 - 400: 15 units CBG > 400: call MD 01/29/17   [name], [name]  Insulin Detemir (LEVEMIR) 100 UNIT/ML Pen Inject 20 Units into the skin daily at 10 pm. 01/30/17   [name], [name]  Insulin Pen Needle 31G X 5 MM MISC Use as needed to inject insulin as prescribed 01/20/17   Barton Dubois, MD  vitamin B-12 (CYANOCOBALAMIN) 1000 MCG tablet Take 1 tablet (1,000 mcg total) by mouth daily. 01/20/17   Barton Dubois, MD    Physical Exam: Vitals:   03/22/17 1625 03/22/17 1857  BP: 124/73 (!) 108/56  Pulse: 64 64  Resp: 20 (!) 22  Temp: 97.6 F (36.4 C) 98.2 F (36.8 C)  TempSrc: Oral Oral  SpO2: 99% 99%     General:  Appears somnolent and is NAD Eyes:  PERRL, EOMI, normal lids, iris ENT:  grossly normal hearing, lips & tongue, mmm Neck:  no LAD, masses or thyromegaly Cardiovascular:  RRR, no m/r/g.  No LE edema.  Respiratory:  CTA bilaterally, no w/r/r. Normal respiratory effort. Abdomen:  soft, superficial TTP along right and mid lower abdomen without apparent ecchymosis, nd, NABS Skin:  no rash or induration seen on limited exam Musculoskeletal:  grossly normal tone BUE/BLE, good ROM, no bony abnormality.  There is a joint effusion on the right elbow that is not erythematous but is TTP. Psychiatric:  grossly normal mood and affect, speech fluent and appropriate, AOx3, possible mild confusion but mostly somnolent but able to answer questions appropriately Neurologic:  CN 2-12 grossly intact, moves all extremities in coordinated fashion, sensation intact  Labs on Admission: I have personally reviewed following labs and imaging  studies  CBC:  Recent Labs Lab 03/22/17 1705  WBC 10.5  HGB 10.3*  HCT 31.4*  MCV 93.5  PLT 846   Basic Metabolic Panel:  Recent Labs Lab 03/22/17 1705  NA 130*  K 5.5*  CL 97*  CO2 14*  GLUCOSE 818*  BUN 72*  CREATININE 2.89*  CALCIUM 9.5   GFR: CrCl cannot be calculated (Unknown ideal weight.). Liver Function Tests:  Recent Labs Lab 03/22/17 1705  AST 26  ALT 26  ALKPHOS 120  BILITOT 1.2  PROT 8.2*  ALBUMIN 4.4   No results for input(s): LIPASE, AMYLASE in the last 168 hours. No results for input(s): AMMONIA in the last 168 hours. Coagulation Profile: No results for input(s): INR, PROTIME in the last 168 hours. Cardiac Enzymes: No results for input(s): CKTOTAL, CKMB, CKMBINDEX, TROPONINI in the last 168 hours. BNP (last 3 results) No results for input(s): PROBNP in the last 8760 hours. HbA1C: No results for input(s): HGBA1C in the last 72 hours. CBG:  Recent Labs Lab 03/22/17 1724 03/22/17 1919 03/22/17 1922  GLUCAP >600* 429* 451*   Lipid Profile: No results for input(s): CHOL, HDL, LDLCALC, TRIG, CHOLHDL, LDLDIRECT in the last 72 hours. Thyroid Function Tests: No results for input(s): TSH, T4TOTAL, FREET4, T3FREE,  THYROIDAB in the last 72 hours. Anemia Panel: No results for input(s): VITAMINB12, FOLATE, FERRITIN, TIBC, IRON, RETICCTPCT in the last 72 hours. Urine analysis:    Component Value Date/Time   COLORURINE COLORLESS (A) 01/29/2017 1159   APPEARANCEUR CLEAR 01/29/2017 1159   LABSPEC 1.013 01/29/2017 1159   PHURINE 5.0 01/29/2017 1159   GLUCOSEU >=500 (A) 01/29/2017 1159   HGBUR SMALL (A) 01/29/2017 1159   BILIRUBINUR NEGATIVE 01/29/2017 1159   KETONESUR 5 (A) 01/29/2017 1159   PROTEINUR NEGATIVE 01/29/2017 1159   UROBILINOGEN 0.2 01/29/2017 1041   NITRITE NEGATIVE 01/29/2017 1159   LEUKOCYTESUR NEGATIVE 01/29/2017 1159    Creatinine Clearance: CrCl cannot be calculated (Unknown ideal weight.).  Sepsis Labs: '@LABRCNTIP' (procalcitonin:4,lacticidven:4) )No results found for this or any previous visit (from the past 240 hour(s)).   Radiological Exams on Admission: Dg Elbow Complete Right (3+view)  Result Date: 03/22/2017 CLINICAL DATA:  Right elbow pain after falling. EXAM: RIGHT ELBOW - COMPLETE 3+ VIEW COMPARISON:  None. FINDINGS: There is no evidence of fracture, dislocation, or joint effusion. There is no evidence of arthropathy or other focal bone abnormality. Soft tissues are unremarkable. Corticated fragment posterior to the olecranon process is favored to be chronic. IMPRESSION: No acute fracture or dislocation of the right elbow. Electronically Signed   By: Ulyses Jarred M.D.   On: 03/22/2017 19:47    EKG: Independently reviewed.  NSR with rate 64; LAFB with no evidence of acute ischemia, NSCSLT  Assessment/Plan Principal Problem:   DKA (diabetic ketoacidosis) ([name]) Active Problems:   Acute renal failure superimposed on stage 3 chronic kidney disease ([name])   Elbow pain, right   Abdominal pain due to injury  DKA with h/o poorly controlled DM type 2 -Patient with poor baseline control - his last A1c was 10.2 on 01/29/17 -He reports medication compliance but has a h/o  non-compliance and suspect that this is the source of his DKA -No indication of illness as source -Moderate DKA on admission based on pH 7.189, anion gap 19, HCO3 13, patient somewhat drowsy -Will admit to SDU with DKA protocol -Would recommend continuing insulin drip at least until morning regardless of rapidity of closure of gap and normalization of labs -K+ slightly  increased at time of presentation but anticipate need for supplemental KCl with future BMP -Check BMP q 4h for now -NPO until gap closes and patient is no longer acidotic -IVF at 150 cc/hr, NS until glucose <250 and then decrease rate to 125 and change to D51/2NS -Glucose 818, >600, 429, 451 -The patient lives alone and this raises concern about whether this is the best environment for him.  He reports that his daughter visits him daily.  He likely either needs home health assistance or placement.  Will request SW consult.  Acute renal failure on CKD -BUN 72/Creatinine 2.89/GFR 23; prior 19/0.99/>60 on 4/22 -This is likely due to dehydration in the setting of DKA and chronically uncontrolled DM -Hgb 10.3, stable; likely anemia associated with chronic renal disease  Elbow pain -Elbow x-ray to assess for fracture - negative -Will check uric acid level, as an acute gouty attack could be a precipitant for DKA -Otherwise, symptomatic treatment for now  Abdominal pain -Appears to be superficial from trauma -No apparent injury on exam -Will follow   DVT prophylaxis: Lovenox  Code Status: DNR - confirmed with patient Family Communication: None present Disposition Plan:  To be determined Consults called: SW Admission status:  Admit - It is my clinical opinion that admission to INPATIENT is reasonable and necessary because this patient will require at least 2 midnights in the hospital to treat this condition based on the medical complexity of the problems presented.  Given the aforementioned information, the predictability of  an adverse outcome is felt to be significant.   Karmen Bongo MD [facility]  If 7PM-7AM, please contact night-coverage [email or web address] Password [email or web address]  03/22/2017, 8:03 PM

## 2017-03-22 NOTE — ED Triage Notes (Addendum)
Per EMS, pt from home.  Pt checked cbg at home.  Pt has had multiple high reading and cannot get it down.  CBG in route registering high.   No other symptoms.  Vitals: 188/68, hr 67, 100% ra, resp 16.  22g Rt thumb  400 cc NS in route

## 2017-03-23 DIAGNOSIS — E1310 Other specified diabetes mellitus with ketoacidosis without coma: Secondary | ICD-10-CM

## 2017-03-23 LAB — BASIC METABOLIC PANEL
Anion gap: 8 (ref 5–15)
Anion gap: 9 (ref 5–15)
Anion gap: 9 (ref 5–15)
Anion gap: 9 (ref 5–15)
Anion gap: 10 (ref 5–15)
Anion gap: 12 (ref 5–15)
BUN: 64 mg/dL — ABNORMAL HIGH (ref 6–20)
BUN: 62 mg/dL — ABNORMAL HIGH (ref 6–20)
BUN: 54 mg/dL — ABNORMAL HIGH (ref 6–20)
BUN: 55 mg/dL — ABNORMAL HIGH (ref 6–20)
BUN: 61 mg/dL — ABNORMAL HIGH (ref 6–20)
BUN: 56 mg/dL — ABNORMAL HIGH (ref 6–20)
CO2: 22 mmol/L (ref 22–32)
CO2: 20 mmol/L — ABNORMAL LOW (ref 22–32)
CO2: 20 mmol/L — ABNORMAL LOW (ref 22–32)
CO2: 21 mmol/L — ABNORMAL LOW (ref 22–32)
CO2: 17 mmol/L — ABNORMAL LOW (ref 22–32)
CO2: 17 mmol/L — ABNORMAL LOW (ref 22–32)
Calcium: 9.2 mg/dL (ref 8.9–10.3)
Calcium: 9 mg/dL (ref 8.9–10.3)
Calcium: 9.1 mg/dL (ref 8.9–10.3)
Calcium: 9.2 mg/dL (ref 8.9–10.3)
Calcium: 8.2 mg/dL — ABNORMAL LOW (ref 8.9–10.3)
Calcium: 8.6 mg/dL — ABNORMAL LOW (ref 8.9–10.3)
Chloride: 111 mmol/L (ref 101–111)
Chloride: 113 mmol/L — ABNORMAL HIGH (ref 101–111)
Chloride: 114 mmol/L — ABNORMAL HIGH (ref 101–111)
Chloride: 114 mmol/L — ABNORMAL HIGH (ref 101–111)
Chloride: 103 mmol/L (ref 101–111)
Chloride: 105 mmol/L (ref 101–111)
Creatinine, Ser: 2.1 mg/dL — ABNORMAL HIGH (ref 0.61–1.24)
Creatinine, Ser: 1.98 mg/dL — ABNORMAL HIGH (ref 0.61–1.24)
Creatinine, Ser: 1.94 mg/dL — ABNORMAL HIGH (ref 0.61–1.24)
Creatinine, Ser: 1.99 mg/dL — ABNORMAL HIGH (ref 0.61–1.24)
Creatinine, Ser: 2.17 mg/dL — ABNORMAL HIGH (ref 0.61–1.24)
Creatinine, Ser: 2.04 mg/dL — ABNORMAL HIGH (ref 0.61–1.24)
GFR calc Af Amer: 35 mL/min — ABNORMAL LOW (ref >60)
GFR calc Af Amer: 37 mL/min — ABNORMAL LOW (ref >60)
GFR calc Af Amer: 37 mL/min — ABNORMAL LOW (ref >60)
GFR calc Af Amer: 38 mL/min — ABNORMAL LOW (ref >60)
GFR calc Af Amer: 33 mL/min — ABNORMAL LOW (ref >60)
GFR calc Af Amer: 36 mL/min — ABNORMAL LOW (ref >60)
GFR calc non Af Amer: 30 mL/min — ABNORMAL LOW (ref >60)
GFR calc non Af Amer: 32 mL/min — ABNORMAL LOW (ref >60)
GFR calc non Af Amer: 32 mL/min — ABNORMAL LOW (ref >60)
GFR calc non Af Amer: 33 mL/min — ABNORMAL LOW (ref >60)
GFR calc non Af Amer: 29 mL/min — ABNORMAL LOW (ref >60)
GFR calc non Af Amer: 31 mL/min — ABNORMAL LOW (ref >60)
Glucose, Bld: 161 mg/dL — ABNORMAL HIGH (ref 65–99)
Glucose, Bld: 100 mg/dL — ABNORMAL HIGH (ref 65–99)
Glucose, Bld: 52 mg/dL — ABNORMAL LOW (ref 65–99)
Glucose, Bld: 52 mg/dL — ABNORMAL LOW (ref 65–99)
Glucose, Bld: 740 mg/dL (ref 65–99)
Glucose, Bld: 289 mg/dL — ABNORMAL HIGH (ref 65–99)
Potassium: 4.5 mmol/L (ref 3.5–5.1)
Potassium: 4.2 mmol/L (ref 3.5–5.1)
Potassium: 4.3 mmol/L (ref 3.5–5.1)
Potassium: 4.5 mmol/L (ref 3.5–5.1)
Potassium: 5.9 mmol/L — ABNORMAL HIGH (ref 3.5–5.1)
Potassium: 4.3 mmol/L (ref 3.5–5.1)
Sodium: 141 mmol/L (ref 135–145)
Sodium: 142 mmol/L (ref 135–145)
Sodium: 143 mmol/L (ref 135–145)
Sodium: 144 mmol/L (ref 135–145)
Sodium: 130 mmol/L — ABNORMAL LOW (ref 135–145)
Sodium: 134 mmol/L — ABNORMAL LOW (ref 135–145)

## 2017-03-23 LAB — GLUCOSE, CAPILLARY
Glucose-Capillary: 102 mg/dL — ABNORMAL HIGH (ref 65–99)
Glucose-Capillary: 103 mg/dL — ABNORMAL HIGH (ref 65–99)
Glucose-Capillary: 107 mg/dL — ABNORMAL HIGH (ref 65–99)
Glucose-Capillary: 108 mg/dL — ABNORMAL HIGH (ref 65–99)
Glucose-Capillary: 110 mg/dL — ABNORMAL HIGH (ref 65–99)
Glucose-Capillary: 111 mg/dL — ABNORMAL HIGH (ref 65–99)
Glucose-Capillary: 111 mg/dL — ABNORMAL HIGH (ref 65–99)
Glucose-Capillary: 128 mg/dL — ABNORMAL HIGH (ref 65–99)
Glucose-Capillary: 142 mg/dL — ABNORMAL HIGH (ref 65–99)
Glucose-Capillary: 148 mg/dL — ABNORMAL HIGH (ref 65–99)
Glucose-Capillary: 150 mg/dL — ABNORMAL HIGH (ref 65–99)
Glucose-Capillary: 168 mg/dL — ABNORMAL HIGH (ref 65–99)
Glucose-Capillary: 214 mg/dL — ABNORMAL HIGH (ref 65–99)
Glucose-Capillary: 267 mg/dL — ABNORMAL HIGH (ref 65–99)
Glucose-Capillary: 345 mg/dL — ABNORMAL HIGH (ref 65–99)
Glucose-Capillary: 476 mg/dL — ABNORMAL HIGH (ref 65–99)
Glucose-Capillary: 50 mg/dL — ABNORMAL LOW (ref 65–99)
Glucose-Capillary: 558 mg/dL (ref 65–99)
Glucose-Capillary: 86 mg/dL (ref 65–99)
Glucose-Capillary: >600 mg/dL (ref 65–99)
Glucose-Capillary: >600 mg/dL (ref 65–99)

## 2017-03-23 LAB — URINALYSIS, ROUTINE W REFLEX MICROSCOPIC
Bilirubin Urine: NEGATIVE
Glucose, UA: >=500 mg/dL — AB
Ketones, ur: 5 mg/dL — AB
Nitrite: NEGATIVE
Protein, ur: NEGATIVE mg/dL
Specific Gravity, Urine: 1.01 (ref 1.005–1.030)
pH: 5 (ref 5.0–8.0)

## 2017-03-23 LAB — RAPID URINE DRUG SCREEN, HOSP PERFORMED
Amphetamines: NOT DETECTED
Barbiturates: NOT DETECTED
Benzodiazepines: NOT DETECTED
Cocaine: POSITIVE — AB
Opiates: NOT DETECTED
Tetrahydrocannabinol: NOT DETECTED

## 2017-03-23 LAB — PHOSPHORUS: Phosphorus: 4.1 mg/dL (ref 2.5–4.6)

## 2017-03-23 LAB — MAGNESIUM: Magnesium: 2.6 mg/dL — ABNORMAL HIGH (ref 1.7–2.4)

## 2017-03-23 LAB — URIC ACID: Uric Acid, Serum: 7.7 mg/dL — ABNORMAL HIGH (ref 4.4–7.6)

## 2017-03-23 MED ORDER — INSULIN ASPART 100 UNIT/ML ~~LOC~~ SOLN
0.0000 [IU] | Freq: Three times a day (TID) | SUBCUTANEOUS | Status: DC
  Administered 2017-03-23: 2 [IU] via SUBCUTANEOUS
  Administered 2017-03-23: 15 [IU] via SUBCUTANEOUS
  Administered 2017-03-24: 5 [IU] via SUBCUTANEOUS
  Administered 2017-03-25 – 2017-03-27 (×3): 8 [IU] via SUBCUTANEOUS

## 2017-03-23 MED ORDER — INSULIN DETEMIR 100 UNIT/ML ~~LOC~~ SOLN
20.0000 [IU] | Freq: Every day | SUBCUTANEOUS | Status: DC
  Administered 2017-03-23 – 2017-03-24 (×3): 20 [IU] via SUBCUTANEOUS
  Filled 2017-03-23 (×3): qty 0.2

## 2017-03-23 MED ORDER — INSULIN ASPART 100 UNIT/ML ~~LOC~~ SOLN
0.0000 [IU] | Freq: Every day | SUBCUTANEOUS | Status: DC
  Administered 2017-03-24 – 2017-03-25 (×2): 5 [IU] via SUBCUTANEOUS

## 2017-03-23 NOTE — Progress Notes (Signed)
CRITICAL VALUE ALERT  Critical Value:  Glucose   Date & Time Notifed:  03/23/17  1320   Provider Notified: Dr. Doyle Askew.  Orders Received/Actions taken: Restart insulin drip with GlucoStablizer per protocol

## 2017-03-23 NOTE — Progress Notes (Addendum)
CSW consulted to assist with d/c planning. Please  consult PT, if appropriate. This will help determine if pt qualifies for a [facility] placement. CSW will review recommendations and assist if SNF placement is recommended.   Werner Lean LCSW [phone]

## 2017-03-23 NOTE — Progress Notes (Signed)
CBG of 50, Patient A/O x4, NAD. Patient given ensure. Repeat CBG of 107.

## 2017-03-23 NOTE — Progress Notes (Signed)
Notified Dr.Myers per text page of last two CBG 148, 145 no new orders at this time.

## 2017-03-23 NOTE — Progress Notes (Addendum)
Patient ID: [name] [name], male   DOB: Dec 25, 1945, 71 y.o.   MRN: [id number]    PROGRESS NOTE    [name] [name]  ZDG:[id number] DOB: 08-14-1946 DOA: 03/22/2017  PCP: [name], [name]   Brief Narrative:   HPI:  Pt is 71 yo male wit known hx of CVA, HTN, DM, last admission in April 2018 for DKA, presented for evaluation of fatigue, nausea, poor oral intake, elevated sugar levels at home. Pt also reports falling down on his righ side and suspects this to be due to weakness.   Assessment & Plan:   DKA with h/o poorly controlled DM type 2 - poor baseline control A1C 10.2 on 01/29/2017 - still elevated CBG's, keep on insulin drip and monitor BMP until gap closes  - keep in SDU today due to insulin drip requirement   Acute renal failure on CKD, stage III - BUN 72/Creatinine 2.89/GFR 23; prior 19/0.99/>60 on 4/22 - Cr is overall trending down with IVF and better sugar control  - BMP in AM  Elbow pain - Elbow x-ray to assess for fracture - negative - OT eval   Abdominal pain - from recent fall and DKA - improved this AM  HyperK - resolved   DVT prophylaxis: Lovenox SQ Code Status: Full  Family Communication: Patient at bedside  Disposition Plan: to be determined   Consultants:   None  Procedures:   None  Antimicrobials:   None  Subjective: Pt reports feeling better, wants to eat.   Objective: Vitals:   03/23/17 1550 03/23/17 1600 03/23/17 1700 03/23/17 1800  BP:  (!) 133/53 126/62 132/65  Pulse:  64 63 63  Resp:      Temp: 98.4 F (36.9 C)     TempSrc: Oral     SpO2:  98% 98% 98%  Weight:      Height:        Intake/Output Summary (Last 24 hours) at 03/23/17 1909 Last data filed at 03/23/17 1800  Gross per 24 hour  Intake          3730.08 ml  Output             1726 ml  Net          2004.08 ml   Filed Weights   03/22/17 2254  Weight: 61.2 kg (134 lb 14.7 oz)    Examination:  General exam: Appears calm and comfortable  Respiratory system:  Clear to auscultation. Respiratory effort normal. Cardiovascular system: S1 & S2 heard, RRR. No JVD, murmurs, rubs, gallops or clicks. No pedal edema. Gastrointestinal system: Abdomen is nondistended, soft and nontender. No organomegaly or masses felt. Normal bowel sounds heard. Central nervous system: Alert and oriented. No focal neurological deficits. Extremities: Symmetric 5 x 5 power. Skin: No rashes, lesions or ulcers Psychiatry: Judgement and insight appear normal. Mood & affect appropriate.    Data Reviewed: I have personally reviewed following labs and imaging studies  CBC:  Recent Labs Lab 03/22/17 1705  WBC 10.5  HGB 10.3*  HCT 31.4*  MCV 93.5  PLT 951   Basic Metabolic Panel:  Recent Labs Lab 03/23/17 0016 03/23/17 0340 03/23/17 0757 03/23/17 1211 03/23/17 1528  NA 141 142 143  144 130* 134*  K 4.5 4.2 4.3  4.5 5.9* 4.3  CL 111 113* 114*  114* 103 105  CO2 22 20* 20*  21* 17* 17*  GLUCOSE 161* 100* 52*  52* 740* 289*  BUN 64* 62* 55*  54* 61* 56*  CREATININE 2.10* 1.98* 1.99*  1.94* 2.17* 2.04*  CALCIUM 9.2 9.0 9.2  9.1 8.2* 8.6*  MG 2.6*  --   --   --   --   PHOS 4.1  --   --   --   --    GFR: Estimated Creatinine Clearance: 28.3 mL/min (A) (by C-G formula based on SCr of 2.04 mg/dL (H)). Liver Function Tests:  Recent Labs Lab 03/22/17 1705  AST 26  ALT 26  ALKPHOS 120  BILITOT 1.2  PROT 8.2*  ALBUMIN 4.4   No results for input(s): LIPASE, AMYLASE in the last 168 hours. No results for input(s): AMMONIA in the last 168 hours. Coagulation Profile: No results for input(s): INR, PROTIME in the last 168 hours. Cardiac Enzymes: No results for input(s): CKTOTAL, CKMB, CKMBINDEX, TROPONINI in the last 168 hours. BNP (last 3 results) No results for input(s): PROBNP in the last 8760 hours. HbA1C: No results for input(s): HGBA1C in the last 72 hours. CBG:  Recent Labs Lab 03/23/17 1451 03/23/17 1536 03/23/17 1654 03/23/17 1802  03/23/17 1852  GLUCAP 345* 267* 148* 142* 150*   Lipid Profile: No results for input(s): CHOL, HDL, LDLCALC, TRIG, CHOLHDL, LDLDIRECT in the last 72 hours. Thyroid Function Tests: No results for input(s): TSH, T4TOTAL, FREET4, T3FREE, THYROIDAB in the last 72 hours. Anemia Panel: No results for input(s): VITAMINB12, FOLATE, FERRITIN, TIBC, IRON, RETICCTPCT in the last 72 hours. Urine analysis:    Component Value Date/Time   COLORURINE YELLOW 03/23/2017 0217   APPEARANCEUR HAZY (A) 03/23/2017 0217   LABSPEC 1.010 03/23/2017 0217   PHURINE 5.0 03/23/2017 0217   GLUCOSEU >=500 (A) 03/23/2017 0217   HGBUR SMALL (A) 03/23/2017 0217   BILIRUBINUR NEGATIVE 03/23/2017 0217   KETONESUR 5 (A) 03/23/2017 0217   PROTEINUR NEGATIVE 03/23/2017 0217   UROBILINOGEN 0.2 01/29/2017 1041   NITRITE NEGATIVE 03/23/2017 0217   LEUKOCYTESUR TRACE (A) 03/23/2017 0217   Sepsis Labs: @LABRCNTIP (procalcitonin:4,lacticidven:4)  ) Recent Results (from the past 240 hour(s))  MRSA PCR Screening     Status: None   Collection Time: 03/22/17  9:00 PM  Result Value Ref Range Status   MRSA by PCR NEGATIVE NEGATIVE Final    Comment:        The GeneXpert MRSA Assay (FDA approved for NASAL specimens only), is one component of a comprehensive MRSA colonization surveillance program. It is not intended to diagnose MRSA infection nor to guide or monitor treatment for MRSA infections.       Radiology Studies: Dg Elbow Complete Right (3+view)  Result Date: 03/22/2017 CLINICAL DATA:  Right elbow pain after falling. EXAM: RIGHT ELBOW - COMPLETE 3+ VIEW COMPARISON:  None. FINDINGS: There is no evidence of fracture, dislocation, or joint effusion. There is no evidence of arthropathy or other focal bone abnormality. Soft tissues are unremarkable. Corticated fragment posterior to the olecranon process is favored to be chronic. IMPRESSION: No acute fracture or dislocation of the right elbow. Electronically Signed    By: Ulyses Jarred M.D.   On: 03/22/2017 19:47      Scheduled Meds: . aspirin  81 mg Oral Daily  . carvedilol  6.25 mg Oral BID WC  . enoxaparin (LOVENOX) injection  30 mg Subcutaneous Q24H  . FLUoxetine  40 mg Oral Daily  . insulin aspart  0-15 Units Subcutaneous TID WC  . insulin aspart  0-5 Units Subcutaneous QHS  . insulin detemir  20 Units Subcutaneous QHS   Continuous Infusions: . sodium chloride 150 mL/hr  at 03/23/17 1800  . dextrose 5 % and 0.45% NaCl Stopped (03/23/17 1153)  . insulin (NOVOLIN-R) infusion 0.9 Units/hr (03/23/17 1854)     LOS: 1 day   Time spent: 35 minutes   Faye Ramsay, MD [facility] Pager [phone]  If 7PM-7AM, please contact night-coverage [email or web address] Password [email or web address] 03/23/2017, 7:09 PM

## 2017-03-23 NOTE — Progress Notes (Addendum)
CBG greater than 600, infusion of D5 . 5 stopped, 15 units of regular insulin given per sliding scale sub q,  ordered stat lab glucose per protocol text messaged D. Doyle Askew.   [facility] RN

## 2017-03-23 NOTE — Care Management Note (Signed)
Case Management Note  Patient Details  Name: [name] [name] MRN: [id number] Date of Birth: Feb 06, 1946  Subjective/Objective:                  Patient coming from: Home - lives alone but his daughter comes and stay with him sometimes; NOK: daughter  Chief Complaint: hyperglycemia  Action/Plan: Date:  March 23, 2017 Chart reviewed for concurrent status and case management needs. Will continue to follow patient progress. Discharge Planning: following for needs Expected discharge date: [id number] [name], BSN, [place], [place]  Expected Discharge Date:                  Expected Discharge Plan:  Home/Self Care  In-House Referral:     Discharge planning Services  CM Consult  Post Acute Care Choice:    Choice offered to:     DME Arranged:    DME Agency:     HH Arranged:    HH Agency:     Status of Service:  In process, will continue to follow  If discussed at Long Length of Stay Meetings, dates discussed:    Additional Comments:  Leeroy Cha, RN 03/23/2017, 9:34 AM

## 2017-03-24 LAB — BASIC METABOLIC PANEL
Anion gap: 5 (ref 5–15)
BUN: 41 mg/dL — ABNORMAL HIGH (ref 6–20)
CO2: 23 mmol/L (ref 22–32)
Calcium: 8.5 mg/dL — ABNORMAL LOW (ref 8.9–10.3)
Chloride: 112 mmol/L — ABNORMAL HIGH (ref 101–111)
Creatinine, Ser: 1.36 mg/dL — ABNORMAL HIGH (ref 0.61–1.24)
GFR calc Af Amer: 58 mL/min — ABNORMAL LOW (ref >60)
GFR calc non Af Amer: 50 mL/min — ABNORMAL LOW (ref >60)
Glucose, Bld: 69 mg/dL (ref 65–99)
Potassium: 3.9 mmol/L (ref 3.5–5.1)
Sodium: 140 mmol/L (ref 135–145)

## 2017-03-24 LAB — GLUCOSE, CAPILLARY
Glucose-Capillary: 158 mg/dL — ABNORMAL HIGH (ref 65–99)
Glucose-Capillary: 114 mg/dL — ABNORMAL HIGH (ref 65–99)
Glucose-Capillary: 74 mg/dL (ref 65–99)
Glucose-Capillary: 71 mg/dL (ref 65–99)
Glucose-Capillary: 38 mg/dL — CL (ref 65–99)
Glucose-Capillary: 69 mg/dL (ref 65–99)
Glucose-Capillary: 136 mg/dL — ABNORMAL HIGH (ref 65–99)
Glucose-Capillary: 204 mg/dL — ABNORMAL HIGH (ref 65–99)
Glucose-Capillary: 83 mg/dL (ref 65–99)
Glucose-Capillary: 397 mg/dL — ABNORMAL HIGH (ref 65–99)

## 2017-03-24 MED ORDER — DEXTROSE-NACL 5-0.45 % IV SOLN
INTRAVENOUS | Status: DC
  Administered 2017-03-24: 04:00:00 via INTRAVENOUS

## 2017-03-24 MED ORDER — DEXTROSE 50 % IV SOLN: 1.0000 | INTRAVENOUS | Status: DC | PRN

## 2017-03-24 MED ORDER — ENOXAPARIN SODIUM 40 MG/0.4ML ~~LOC~~ SOLN
40.0000 mg | SUBCUTANEOUS | Status: DC
  Administered 2017-03-25 – 2017-03-31 (×7): 40 mg via SUBCUTANEOUS
  Filled 2017-03-24 (×7): qty 0.4

## 2017-03-24 MED ORDER — SODIUM CHLORIDE 0.9 % IV SOLN
INTRAVENOUS | Status: DC
  Administered 2017-03-24 (×2): via INTRAVENOUS

## 2017-03-24 NOTE — Progress Notes (Signed)
Hypoglycemic Event  CBG: 38  Treatment: 15 GM carbohydrate snack  Symptoms: None  Follow-up CBG: Time:0750 CBG Result:69  Possible Reasons for Event: Medication regimen: insulin gtt  Comments/MD notified:Myers    Mendel Corning

## 2017-03-24 NOTE — Progress Notes (Addendum)
Patient ID: [name] [name], male   DOB: [birth year], 71 y.o.   MRN: [id number]    PROGRESS NOTE  [name] [name]  TIW:[id number] DOB: February 04, 1946 DOA: 03/22/2017  PCP: [name], [name]   Brief Narrative:   HPI:  Pt is 71 yo male wit known hx of CVA, HTN, DM, last admission in April 2018 for DKA, presented for evaluation of fatigue, nausea, poor oral intake, elevated sugar levels at home. Pt also reports falling down on his righ side and suspects this to be due to weakness.   Assessment & Plan:   DKA with h/o poorly controlled DM type 2 - poor baseline control A1C 10.2 on 01/29/2017 - has been on insulin drip, AG closed and pt now off insulin drip, transitioned to home regimen insulin  - transfer to med unit today and if continues to improve, plan d/c in AM  Acute renal failure on CKD, stage III - BUN 72/Creatinine 2.89/GFR 23 - Cr overall trending down, 1.36 this AM - pt so far tolerating diet,stiop IVF  - BMP in AM  Elbow pain - Elbow x-ray to assess for fracture - negative - OT eval  - no pain this AM   Substance use - UDS positive for cocaine - pt not sure how it got "there" - advised on cessation  Abdominal pain - from recent fall and DKA - improved this AM  HyperK - resolved  - BMP in AM  DVT prophylaxis: Lovenox SQ Code Status: SNR Family Communication: pt at bedside  Disposition Plan: home in AM  Consultants:   None  Procedures:   None  Antimicrobials:   None  Subjective: Pt reports feeling better.   Objective: Vitals:   03/24/17 0820 03/24/17 1020 03/24/17 1141 03/24/17 1315  BP: (!) 126/57 (!) 119/58  129/84  Pulse: 66 62  65  Resp: 20 (!) 23  20  Temp:   98.2 F (36.8 C) 98.4 F (36.9 C)  TempSrc:   Oral Oral  SpO2: 98% 100%  100%  Weight:      Height:        Intake/Output Summary (Last 24 hours) at 03/24/17 1905 Last data filed at 03/24/17 1733  Gross per 24 hour  Intake          4271.71 ml  Output             2680 ml  Net           1591.71 ml   Filed Weights   03/22/17 2254  Weight: 61.2 kg (134 lb 14.7 oz)   Physical Exam  Constitutional: Appears well-developed and well-nourished. No distress.  CVS: RRR, S1/S2 +, no gallops, no carotid bruit.  Pulmonary: Effort and breath sounds normal, no stridor, rhonchi, wheezes, rales.  Abdominal: Soft. BS +,  no distension, tenderness, rebound or guarding.  Musculoskeletal: Normal range of motion. No edema and no tenderness.   Data Reviewed: I have personally reviewed following labs and imaging studies  CBC:  Recent Labs Lab 03/22/17 1705  WBC 10.5  HGB 10.3*  HCT 31.4*  MCV 93.5  PLT 250   Basic Metabolic Panel:  Recent Labs Lab 03/23/17 0016 03/23/17 0340 03/23/17 0757 03/23/17 1211 03/23/17 1528 03/24/17 0343  NA 141 142 143  144 130* 134* 140  K 4.5 4.2 4.3  4.5 5.9* 4.3 3.9  CL 111 113* 114*  114* 103 105 112*  CO2 22 20* 20*  21* 17* 17* 23  GLUCOSE 161* 100* 52*  52* 740* 289* 69  BUN 64* 62* 55*  54* 61* 56* 41*  CREATININE 2.10* 1.98* 1.99*  1.94* 2.17* 2.04* 1.36*  CALCIUM 9.2 9.0 9.2  9.1 8.2* 8.6* 8.5*  MG 2.6*  --   --   --   --   --   PHOS 4.1  --   --   --   --   --    Liver Function Tests:  Recent Labs Lab 03/22/17 1705  AST 26  ALT 26  ALKPHOS 120  BILITOT 1.2  PROT 8.2*  ALBUMIN 4.4   CBG:  Recent Labs Lab 03/24/17 0716 03/24/17 0748 03/24/17 0858 03/24/17 1139 03/24/17 1652  GLUCAP 38* 69 136* 204* 83   Urine analysis:    Component Value Date/Time   COLORURINE YELLOW 03/23/2017 0217   APPEARANCEUR HAZY (A) 03/23/2017 0217   LABSPEC 1.010 03/23/2017 0217   PHURINE 5.0 03/23/2017 0217   GLUCOSEU >=500 (A) 03/23/2017 0217   HGBUR SMALL (A) 03/23/2017 0217   BILIRUBINUR NEGATIVE 03/23/2017 0217   KETONESUR 5 (A) 03/23/2017 0217   PROTEINUR NEGATIVE 03/23/2017 0217   UROBILINOGEN 0.2 01/29/2017 1041   NITRITE NEGATIVE 03/23/2017 0217   LEUKOCYTESUR TRACE (A) 03/23/2017 0217   Recent Results  (from the past 240 hour(s))  MRSA PCR Screening     Status: None   Collection Time: 03/22/17  9:00 PM  Result Value Ref Range Status   MRSA by PCR NEGATIVE NEGATIVE Final    Comment:        The GeneXpert MRSA Assay (FDA approved for NASAL specimens only), is one component of a comprehensive MRSA colonization surveillance program. It is not intended to diagnose MRSA infection nor to guide or monitor treatment for MRSA infections.     Radiology Studies: Dg Elbow Complete Right (3+view)  Result Date: 03/22/2017 CLINICAL DATA:  Right elbow pain after falling. EXAM: RIGHT ELBOW - COMPLETE 3+ VIEW COMPARISON:  None. FINDINGS: There is no evidence of fracture, dislocation, or joint effusion. There is no evidence of arthropathy or other focal bone abnormality. Soft tissues are unremarkable. Corticated fragment posterior to the olecranon process is favored to be chronic. IMPRESSION: No acute fracture or dislocation of the right elbow. Electronically Signed   By: Ulyses Jarred M.D.   On: 03/22/2017 19:47    Scheduled Meds: . aspirin  81 mg Oral Daily  . carvedilol  6.25 mg Oral BID WC  . [START ON 03/25/2017] enoxaparin (LOVENOX) injection  40 mg Subcutaneous Q24H  . FLUoxetine  40 mg Oral Daily  . insulin aspart  0-15 Units Subcutaneous TID WC  . insulin aspart  0-5 Units Subcutaneous QHS  . insulin detemir  20 Units Subcutaneous QHS   Continuous Infusions: . sodium chloride 100 mL/hr at 03/24/17 1406     LOS: 2 days   Time spent: 35 minutes   Faye Ramsay, MD [facility] Pager [phone]  If 7PM-7AM, please contact night-coverage [email or web address] Password [facility] 03/24/2017, 7:05 PM

## 2017-03-25 DIAGNOSIS — Z794 Long term (current) use of insulin: Secondary | ICD-10-CM

## 2017-03-25 DIAGNOSIS — F1410 Cocaine abuse, uncomplicated: Secondary | ICD-10-CM

## 2017-03-25 DIAGNOSIS — E119 Type 2 diabetes mellitus without complications: Secondary | ICD-10-CM

## 2017-03-25 DIAGNOSIS — N179 Acute kidney failure, unspecified: Secondary | ICD-10-CM

## 2017-03-25 DIAGNOSIS — W19XXXA Unspecified fall, initial encounter: Secondary | ICD-10-CM

## 2017-03-25 DIAGNOSIS — E1110 Type 2 diabetes mellitus with ketoacidosis without coma: Principal | ICD-10-CM

## 2017-03-25 LAB — BASIC METABOLIC PANEL
Anion gap: 8 (ref 5–15)
BUN: 26 mg/dL — ABNORMAL HIGH (ref 6–20)
CO2: 25 mmol/L (ref 22–32)
Calcium: 9.2 mg/dL (ref 8.9–10.3)
Chloride: 104 mmol/L (ref 101–111)
Creatinine, Ser: 0.98 mg/dL (ref 0.61–1.24)
GFR calc Af Amer: >60 mL/min (ref >60)
GFR calc non Af Amer: >60 mL/min (ref >60)
Glucose, Bld: 32 mg/dL — CL (ref 65–99)
Potassium: 3.9 mmol/L (ref 3.5–5.1)
Sodium: 137 mmol/L (ref 135–145)

## 2017-03-25 LAB — GLUCOSE, CAPILLARY
Glucose-Capillary: 174 mg/dL — ABNORMAL HIGH (ref 65–99)
Glucose-Capillary: 25 mg/dL — CL (ref 65–99)
Glucose-Capillary: 273 mg/dL — ABNORMAL HIGH (ref 65–99)
Glucose-Capillary: 405 mg/dL — ABNORMAL HIGH (ref 65–99)
Glucose-Capillary: 547 mg/dL (ref 65–99)
Glucose-Capillary: 61 mg/dL — ABNORMAL LOW (ref 65–99)
Glucose-Capillary: 80 mg/dL (ref 65–99)

## 2017-03-25 LAB — CBC
HCT: 31.5 % — ABNORMAL LOW (ref 39.0–52.0)
Hemoglobin: 10.8 g/dL — ABNORMAL LOW (ref 13.0–17.0)
MCH: 30.4 pg (ref 26.0–34.0)
MCHC: 34.3 g/dL (ref 30.0–36.0)
MCV: 88.7 fL (ref 78.0–100.0)
Platelets: 180 10*3/uL (ref 150–400)
RBC: 3.55 MIL/uL — ABNORMAL LOW (ref 4.22–5.81)
RDW: 13 % (ref 11.5–15.5)
WBC: 6.7 10*3/uL (ref 4.0–10.5)

## 2017-03-25 MED ORDER — INSULIN DETEMIR 100 UNIT/ML ~~LOC~~ SOLN
20.0000 [IU] | Freq: Every day | SUBCUTANEOUS | Status: DC
  Administered 2017-03-26: 20 [IU] via SUBCUTANEOUS
  Filled 2017-03-25: qty 0.2

## 2017-03-25 MED ORDER — INSULIN ASPART 100 UNIT/ML ~~LOC~~ SOLN
12.0000 [IU] | Freq: Once | SUBCUTANEOUS | Status: AC
  Administered 2017-03-25: 12 [IU] via SUBCUTANEOUS

## 2017-03-25 NOTE — Progress Notes (Signed)
Inpatient Diabetes Program Recommendations  AACE/ADA: New Consensus Statement on Inpatient Glycemic Control (2015)  Target Ranges:  Prepandial:   less than 140 mg/dL      Peak postprandial:   less than 180 mg/dL (1-2 hours)      Critically ill patients:  140 - 180 mg/dL   Results for GRYPHON, VANDERVEEN (MRN [id number]) as of 03/25/2017 11:23  Ref. Range 03/24/2017 01:12 03/24/2017 02:17 03/24/2017 03:20 03/24/2017 04:26 03/24/2017 07:16 03/24/2017 07:48 03/24/2017 08:58 03/24/2017 11:39 03/24/2017 16:52 03/24/2017 20:20  Glucose-Capillary Latest Ref Range: 65 - 99 mg/dL 158 (H) 114 (H) 74 71 38 (LL) 69 136 (H) 204 (H) 83 397 (H)  20 units Levemir   Results for DASHUN, BORRE (MRN [id number]) as of 03/25/2017 11:23  Ref. Range 03/25/2017 07:42 03/25/2017 08:04  Glucose-Capillary Latest Ref Range: 65 - 99 mg/dL 25 (LL) 61 (L)    Home DM Meds: Levemir 20 units QHS       Novolog 0-15 units TID per SSI  Current Insulin Orders: Novolog Moderate Correction Scale/ SSI (0-15 units) TID AC + HS      MD- Note patient received 20 units Levemir at 10 pm on the evening of 06/11.  Woke up with severe Hypoglycemia yesterday AM (CBG down to 38 mg/dl).    Given 20 units Levemir again last PM and woke up again this AM with severe Hypoglycemia (CBG down to 25 mg/dl).  Note Levemir has been discontinued for now.  Patient eating 75% of meals.  Having elevations after meals when he does not receive any Novolog insulin.  Please consider the following:  1. Restart Levemir at a lower dose for tonight: Recommend Levemir 10 units QHS (50% total home dose)  2. Start low dose Novolog Meal Coverage: Novolog 3 units TID with meals (hold if pt eats <50% of meal)      --Will follow patient during hospitalization--  Wyn Quaker RN, MSN, CDE Diabetes Coordinator Inpatient Glycemic Control Team Team Pager: [phone] (8a-5p)

## 2017-03-25 NOTE — Progress Notes (Signed)
Patient ID: [name] [name], male   DOB: 01-13-1946, 71 y.o.   MRN: [id number]    PROGRESS NOTE  [name] [name]  [id number] DOB: 09-05-1946 DOA: 03/22/2017  PCP: [name], [name]   Brief Narrative:   HPI:  Pt is 71 yo male wit known hx of CVA, HTN, DM, last admission in April 2018 for DKA, presented for evaluation of fatigue, nausea, poor oral intake, elevated sugar levels at home. Pt also reports falling down on his righ side and suspects this to be due to weakness.   Assessment & Plan:   DKA with h/o poorly controlled DM type 2 - poor baseline control A1C 10.2 on 01/29/2017 - has been on insulin drip, AG closed and pt now off insulin drip, transitioned to home regimen insulin  - he developed hypoglycemia this am, blood sugar 25 this am, need to continue adjust insulin regimen. Patient report he has lows as low as 13 at home, will likely need home health RN for medication supervision and disease management, case manager notified -continue diabetes education (patient report he has diabetes for 55yrs) -discontinue long acting insulin, continue ssi, continue hypoglycemia   Acute renal failure on CKD, stage III - BUN 72/Creatinine 2.89/GFR 23 - he received iv hydration, cr 0.98 on 6/13  Elbow pain - Elbow x-ray to assess for fracture - negative - OT eval  - no pain this AM   Substance use - UDS positive for cocaine - pt not sure how it got "there" - advised on cessation  Abdominal pain - from recent fall and DKA - improved this AM  HyperK - resolved  - BMP in AM  H/o fall, PT eval, likely will need home health  DVT prophylaxis: Lovenox SQ Code Status: SNR Family Communication: pt at bedside  Disposition Plan: home in AM if blood sugar stable  Consultants:   None  Procedures:   None  Antimicrobials:   None  Subjective: Pt reports feeling better. But has hypoglycemia, blood sugar 25  Objective: Vitals:   03/24/17 2007 03/24/17 2347 03/25/17 0444  03/25/17 1450  BP: (!) 152/70 (!) 143/86 (!) 158/80 (!) 146/79  Pulse: 63 64 61 62  Resp: 20 20 20 20   Temp: 98.4 F (36.9 C) 98.3 F (36.8 C) 98.7 F (37.1 C) 98.5 F (36.9 C)  TempSrc: Oral Oral Oral Oral  SpO2: 100% 100% 100% 100%  Weight:      Height:        Intake/Output Summary (Last 24 hours) at 03/25/17 1452 Last data filed at 03/25/17 0420  Gross per 24 hour  Intake              992 ml  Output             1350 ml  Net             -358 ml   Filed Weights   03/22/17 2254  Weight: 61.2 kg (134 lb 14.7 oz)   Physical Exam  Constitutional: Appears well-developed and well-nourished. No distress.  CVS: RRR, S1/S2 +, no gallops, no carotid bruit.  Pulmonary: Effort and breath sounds normal, no stridor, rhonchi, wheezes, rales.  Abdominal: Soft. BS +,  no distension, tenderness, rebound or guarding.  Musculoskeletal: Normal range of motion. No edema and no tenderness.   Data Reviewed: I have personally reviewed following labs and imaging studies  CBC:  Recent Labs Lab 03/22/17 1705 03/25/17 0749  WBC 10.5 6.7  HGB 10.3* 10.8*  HCT 31.4*  31.5*  MCV 93.5 88.7  PLT 226 440   Basic Metabolic Panel:  Recent Labs Lab 03/23/17 0016  03/23/17 0757 03/23/17 1211 03/23/17 1528 03/24/17 0343 03/25/17 0749  NA 141  < > 143  144 130* 134* 140 137  K 4.5  < > 4.3  4.5 5.9* 4.3 3.9 3.9  CL 111  < > 114*  114* 103 105 112* 104  CO2 22  < > 20*  21* 17* 17* 23 25  GLUCOSE 161*  < > 52*  52* 740* 289* 69 32*  BUN 64*  < > 55*  54* 61* 56* 41* 26*  CREATININE 2.10*  < > 1.99*  1.94* 2.17* 2.04* 1.36* 0.98  CALCIUM 9.2  < > 9.2  9.1 8.2* 8.6* 8.5* 9.2  MG 2.6*  --   --   --   --   --   --   PHOS 4.1  --   --   --   --   --   --   < > = values in this interval not displayed. Liver Function Tests:  Recent Labs Lab 03/22/17 1705  AST 26  ALT 26  ALKPHOS 120  BILITOT 1.2  PROT 8.2*  ALBUMIN 4.4   CBG:  Recent Labs Lab 03/24/17 2020 03/25/17 0111  03/25/17 0742 03/25/17 0804 03/25/17 1200  GLUCAP 397* 174* 25* 61* 273*   Urine analysis:    Component Value Date/Time   COLORURINE YELLOW 03/23/2017 0217   APPEARANCEUR HAZY (A) 03/23/2017 0217   LABSPEC 1.010 03/23/2017 0217   PHURINE 5.0 03/23/2017 0217   GLUCOSEU >=500 (A) 03/23/2017 0217   HGBUR SMALL (A) 03/23/2017 0217   BILIRUBINUR NEGATIVE 03/23/2017 0217   KETONESUR 5 (A) 03/23/2017 0217   PROTEINUR NEGATIVE 03/23/2017 0217   UROBILINOGEN 0.2 01/29/2017 1041   NITRITE NEGATIVE 03/23/2017 0217   LEUKOCYTESUR TRACE (A) 03/23/2017 0217   Recent Results (from the past 240 hour(s))  MRSA PCR Screening     Status: None   Collection Time: 03/22/17  9:00 PM  Result Value Ref Range Status   MRSA by PCR NEGATIVE NEGATIVE Final    Comment:        The GeneXpert MRSA Assay (FDA approved for NASAL specimens only), is one component of a comprehensive MRSA colonization surveillance program. It is not intended to diagnose MRSA infection nor to guide or monitor treatment for MRSA infections.     Radiology Studies: No results found.  Scheduled Meds: . aspirin  81 mg Oral Daily  . carvedilol  6.25 mg Oral BID WC  . enoxaparin (LOVENOX) injection  40 mg Subcutaneous Q24H  . FLUoxetine  40 mg Oral Daily  . insulin aspart  0-15 Units Subcutaneous TID WC  . insulin aspart  0-5 Units Subcutaneous QHS   Continuous Infusions:    LOS: 3 days   Time spent: 25 minutes   Jaramiah Bossard, MD PhD [facility] Pager [phone]  If 7PM-7AM, please contact night-coverage [email or web address] Password [email or web address] 03/25/2017, 2:52 PM

## 2017-03-25 NOTE — Progress Notes (Signed)
[id number]/BOBOFP Sandrea Boer,BSN,RN3,CCM:[phone]/Patient refused hhc.

## 2017-03-25 NOTE — Evaluation (Signed)
Occupational Therapy Evaluation Patient Details Name: [name] [name] MRN: [id number] DOB: 03-06-1946 Today's Date: 03/25/2017    History of Present Illness Pt is 71 yo male wit known hx of CVA, HTN, DM, last admission in April 2018 for DKA, presented for evaluation of fatigue, nausea, poor oral intake, elevated sugar levels at home. Pt also reports falling down on his righ side and suspects this to be due to weakness.    Clinical Impression   Pt admitted with fall. Pt currently with functional limitations due to the deficits listed below (see OT Problem List).  Pt will benefit from skilled OT to increase their safety and independence with ADL and functional mobility for ADL to facilitate discharge to venue listed below.      Follow Up Recommendations  Supervision - Intermittent;Home health OT (HHOT would ensure home safety but pt refuses)    Equipment Recommendations  Other (comment) (pt delines DME )       Precautions / Restrictions Precautions Precautions: None      Mobility Bed Mobility Overal bed mobility: Needs Assistance Bed Mobility: Supine to Sit     Supine to sit: Supervision        Transfers Overall transfer level: Needs assistance Equipment used: 1 person hand held assist Transfers: Sit to/from Stand;Stand Pivot Transfers Sit to Stand: Supervision;Min guard Stand pivot transfers: Supervision;Min guard       General transfer comment: pt needs S- min guard with ambulating to bathroom.      Balance Overall balance assessment: History of Falls;Needs assistance Sitting-balance support: No upper extremity supported Sitting balance-Leahy Scale: Good     Standing balance support: During functional activity Standing balance-Leahy Scale: Fair                             ADL either performed or assessed with clinical judgement   ADL Overall ADL's : Needs assistance/impaired Eating/Feeding: Set up;Sitting   Grooming: Supervision/safety;Standing    Upper Body Bathing: Set up;Sitting   Lower Body Bathing: Set up;Sit to/from stand   Upper Body Dressing : Set up;Sitting   Lower Body Dressing: Set up;Cueing for safety;Cueing for sequencing   Toilet Transfer: Min guard;Ambulation;Cueing for safety   Toileting- Clothing Manipulation and Hygiene: Set up;Sit to/from stand   Tub/ Banker: Walk-in shower;Cueing for safety;Min guard   Functional mobility during ADLs: Min guard;Cueing for safety General ADL Comments: pt refused DME     Vision Patient Visual Report: No change from baseline              Pertinent Vitals/Pain Pain Assessment: No/denies pain     Hand Dominance     Extremity/Trunk Assessment Upper Extremity Assessment Upper Extremity Assessment: Generalized weakness           Communication Communication Communication: No difficulties   Cognition Arousal/Alertness: Awake/alert Behavior During Therapy: WFL for tasks assessed/performed Overall Cognitive Status: Within Functional Limits for tasks assessed                                                Home Living Family/patient expects to be discharged to:: Private residence Living Arrangements: Alone Available Help at Discharge: Family Type of Home: Apartment Home Access: Level entry     Home Layout: One level     Bathroom Shower/Tub: Occupational psychologist:  Standard     Home Equipment: None;Other (comment) (pt states he has not DME)   Additional Comments: pt states daugther comes every day      Prior Functioning/Environment Level of Independence: Needs assistance                 OT Problem List: Decreased strength;Decreased activity tolerance;Decreased safety awareness;Impaired balance (sitting and/or standing)      OT Treatment/Interventions: Self-care/ADL training;DME and/or AE instruction;Patient/family education    OT Goals(Current goals can be found in the care plan section) Acute Rehab  OT Goals Patient Stated Goal: go home OT Goal Formulation: With patient  OT Frequency: Min 2X/week              AM-PAC PT "6 Clicks" Daily Activity     Outcome Measure Help from another person eating meals?: None Help from another person taking care of personal grooming?: None Help from another person toileting, which includes using toliet, bedpan, or urinal?: A Little Help from another person bathing (including washing, rinsing, drying)?: A Little Help from another person to put on and taking off regular upper body clothing?: None Help from another person to put on and taking off regular lower body clothing?: A Little 6 Click Score: 21   End of Session Equipment Utilized During Treatment: Gait belt Nurse Communication: Mobility status  Activity Tolerance: Patient tolerated treatment well Patient left: in bed;with call bell/phone within reach;with bed alarm set  OT Visit Diagnosis: Unsteadiness on feet (R26.81);History of falling (Z91.81);Muscle weakness (generalized) (M62.81)                Time: 1320-1340 OT Time Calculation (min): 20 min Charges:  OT General Charges $OT Visit: 1 Procedure OT Evaluation $OT Eval Moderate Complexity: 1 Procedure G-Codes:     Kari Baars, [place]  Payton Mccallum D 03/25/2017, 1:57 PM

## 2017-03-26 DIAGNOSIS — E162 Hypoglycemia, unspecified: Secondary | ICD-10-CM

## 2017-03-26 LAB — BASIC METABOLIC PANEL
Anion gap: 12 (ref 5–15)
BUN: 28 mg/dL — ABNORMAL HIGH (ref 6–20)
CO2: 22 mmol/L (ref 22–32)
Calcium: 8.9 mg/dL (ref 8.9–10.3)
Chloride: 103 mmol/L (ref 101–111)
Creatinine, Ser: 1.18 mg/dL (ref 0.61–1.24)
GFR calc Af Amer: >60 mL/min (ref >60)
GFR calc non Af Amer: 60 mL/min — ABNORMAL LOW (ref >60)
Glucose, Bld: 127 mg/dL — ABNORMAL HIGH (ref 65–99)
Potassium: 4.3 mmol/L (ref 3.5–5.1)
Sodium: 137 mmol/L (ref 135–145)

## 2017-03-26 LAB — GLUCOSE, CAPILLARY
Glucose-Capillary: 25 mg/dL — CL (ref 65–99)
Glucose-Capillary: 213 mg/dL — ABNORMAL HIGH (ref 65–99)
Glucose-Capillary: 85 mg/dL (ref 65–99)
Glucose-Capillary: 295 mg/dL — ABNORMAL HIGH (ref 65–99)
Glucose-Capillary: 82 mg/dL (ref 65–99)

## 2017-03-26 LAB — MAGNESIUM: Magnesium: 2.3 mg/dL (ref 1.7–2.4)

## 2017-03-26 MED ORDER — INSULIN ASPART 100 UNIT/ML ~~LOC~~ SOLN
3.0000 [IU] | Freq: Three times a day (TID) | SUBCUTANEOUS | Status: DC
  Administered 2017-03-26 – 2017-03-27 (×2): 3 [IU] via SUBCUTANEOUS

## 2017-03-26 MED ORDER — INSULIN DETEMIR 100 UNIT/ML ~~LOC~~ SOLN
10.0000 [IU] | Freq: Every day | SUBCUTANEOUS | Status: DC
  Administered 2017-03-26: 10 [IU] via SUBCUTANEOUS
  Filled 2017-03-26: qty 0.1

## 2017-03-26 MED ORDER — GLUCERNA SHAKE PO LIQD
237.0000 mL | Freq: Two times a day (BID) | ORAL | Status: DC
  Administered 2017-03-26 – 2017-03-31 (×10): 237 mL via ORAL
  Filled 2017-03-26 (×12): qty 237

## 2017-03-26 NOTE — Evaluation (Signed)
Physical Therapy Evaluation Patient Details Name: [name] [name] MRN: [id number] DOB: 21-Oct-1945 Today's Date: 03/26/2017   History of Present Illness  Pt is 71 yo male with known hx of CVA, HTN, DM, last admission in April 2018 for DKA, presented for evaluation of fatigue, nausea, poor oral intake, elevated sugar levels at home. Per H&P pt reported falling down on his right side and suspects this to be due to weakness, pt denied recent falls during PT eval. DX of DKA, UDS + for cocaine.   Clinical Impression  Pt ambulated 280' without an assistive device. He had mild loss of balance with head turns but is adamant that he will not use an assistive device. He denies any recent falls, however H&P indicates he fell just prior to admission. He appears to be at baseline with mobility and is not agreeable to use of DME, so PT will sign off.     Follow Up Recommendations No PT follow up    Equipment Recommendations  None recommended by PT    Recommendations for Other Services       Precautions / Restrictions Precautions Precautions: Fall Restrictions Weight Bearing Restrictions: No      Mobility  Bed Mobility Overal bed mobility: Independent Bed Mobility: Supine to Sit              Transfers Overall transfer level: Independent   Transfers: Sit to/from Stand Sit to Stand: Independent            Ambulation/Gait Ambulation/Gait assistance: Independent Ambulation Distance (Feet): 280 Feet Assistive device: None     Gait velocity interpretation: at or above normal speed for age/gender General Gait Details: decreased hip/knee flexion RLE with some internal rotation RLE (pt denies h/o arthritis and LE injuries), mild LOB with head turns but able to self correct, pt adamantly refused use of AD, HR 70s walking  Stairs            Wheelchair Mobility    Modified Rankin (Stroke Patients Only)       Balance Overall balance assessment: History of Falls Sitting-balance  support: No upper extremity supported Sitting balance-Leahy Scale: Good     Standing balance support: During functional activity Standing balance-Leahy Scale: Good                               Pertinent Vitals/Pain Pain Assessment: No/denies pain    Home Living Family/patient expects to be discharged to:: Private residence Living Arrangements: Alone Available Help at Discharge: Family Type of Home: Apartment Home Access: Elevator     Home Layout: One level Home Equipment: None Additional Comments: pt states daugther comes every day    Prior Function Level of Independence: Independent   Gait / Transfers Assistance Needed: pt reports ambulating without assistive device, denies falls  ADL's / Homemaking Assistance Needed: daughter checks on pt each morning. pt states he bathes and dresses himself.         Hand Dominance   Dominant Hand: Right    Extremity/Trunk Assessment   Upper Extremity Assessment Upper Extremity Assessment: Defer to OT evaluation    Lower Extremity Assessment Lower Extremity Assessment: Overall WFL for tasks assessed    Cervical / Trunk Assessment Cervical / Trunk Assessment: Normal  Communication   Communication: No difficulties  Cognition Arousal/Alertness: Awake/alert Behavior During Therapy: WFL for tasks assessed/performed Overall Cognitive Status: Within Functional Limits for tasks assessed  General Comments      Exercises     Assessment/Plan    PT Assessment Patent does not need any further PT services  PT Problem List         PT Treatment Interventions      PT Goals (Current goals can be found in the Care Plan section)  Acute Rehab PT Goals Patient Stated Goal: go home PT Goal Formulation: All assessment and education complete, DC therapy    Frequency     Barriers to discharge        Co-evaluation               AM-PAC PT "6 Clicks"  Daily Activity  Outcome Measure Difficulty turning over in bed (including adjusting bedclothes, sheets and blankets)?: None Difficulty moving from lying on back to sitting on the side of the bed? : None Difficulty sitting down on and standing up from a chair with arms (e.g., wheelchair, bedside commode, etc,.)?: None Help needed moving to and from a bed to chair (including a wheelchair)?: None Help needed walking in hospital room?: None Help needed climbing 3-5 steps with a railing? : A Little 6 Click Score: 23    End of Session Equipment Utilized During Treatment: Gait belt Activity Tolerance: Patient tolerated treatment well;No increased pain Patient left: in bed;with bed alarm set Nurse Communication: Mobility status      Time: 3748-2707 PT Time Calculation (min) (ACUTE ONLY): 10 min   Charges:   PT Evaluation $PT Eval Low Complexity: 1 Procedure     PT G Codes:          Philomena Doheny 03/26/2017, 9:51 AM [phone]

## 2017-03-26 NOTE — Care Management Important Message (Signed)
Important Message  Patient Details  Name: [name] [name] MRN: [id number] Date of Birth: 11/03/1945   Medicare Important Message Given:  Yes    Kerin Salen 03/26/2017, 11:36 AMImportant Message  Patient Details  Name: [name] [name] MRN: [id number] Date of Birth: 1945/10/21   Medicare Important Message Given:  Yes    Kerin Salen 03/26/2017, 11:36 AM

## 2017-03-26 NOTE — Progress Notes (Signed)
Nutrition Education Note   RD consulted for nutrition education regarding diabetes.   Lab Results  Component Value Date   HGBA1C 10.2 01/29/2017    RD provided "Nutrition and Type II Diabetes" handout from the Academy of [employer]. Discussed different food groups and their effects on blood sugar, emphasizing carbohydrate-containing foods. Provided list of carbohydrates and recommended serving sizes of common foods.  Discussed importance of controlled and consistent carbohydrate intake throughout the day. Provided examples of ways to balance meals/snacks and encouraged intake of high-fiber, whole grain complex carbohydrates. Teach back method used.  Expect fair compliance.  Body mass index is 19.92 kg/m. Pt meets criteria for underweight for age based on current BMI.  Current diet order is carbohydrate controlled, patient is consuming approximately 100% of meals at this time.   RD will switch pt from Ensure to Glucerna  Labs and medications reviewed. No further nutrition interventions warranted at this time. RD contact information provided. If additional nutrition issues arise, please re-consult RD.  Koleen Distance MS, RD, LDN Pager #- [phone]

## 2017-03-26 NOTE — Progress Notes (Signed)
Blood sugar rechecked, 20 units of levemir ordered and administered. Will continue to monitor patient at this time.

## 2017-03-26 NOTE — Progress Notes (Signed)
Occupational Therapy Treatment Patient Details Name: [name] [name] MRN: [id number] DOB: 15-Mar-1946 Today's Date: 03/26/2017    History of present illness Pt is 71 yo male with known hx of CVA, HTN, DM, last admission in April 2018 for DKA, presented for evaluation of fatigue, nausea, poor oral intake, elevated sugar levels at home. Per H&P pt reported falling down on his right side and suspects this to be due to weakness, pt denied recent falls during PT eval. DX of DKA., UDS + for cocaine.   OT comments  Pt with increased I this day! Pt still will need A with IADL activity in which he states daughter will provide  Follow Up Recommendations  Supervision - Intermittent;Home health OT (HHOT would ensure home safety but pt refuses)    Equipment Recommendations  Other (comment) (pt delines DME )       Precautions / Restrictions Precautions Precautions: Fall Restrictions Weight Bearing Restrictions: No       Mobility Bed Mobility Overal bed mobility: Independent Bed Mobility: Supine to Sit              Transfers Overall transfer level: Needs assistance   Transfers: Sit to/from Stand;Stand Pivot Transfers Sit to Stand: Supervision Stand pivot transfers: Supervision       General transfer comment: pt with increased I this day but still needs to call for A to go to bathroom    Balance Overall balance assessment: History of Falls Sitting-balance support: No upper extremity supported Sitting balance-Leahy Scale: Good     Standing balance support: During functional activity Standing balance-Leahy Scale: Good                             ADL either performed or assessed with clinical judgement   ADL Overall ADL's : Needs assistance/impaired     Grooming: Standing;Supervision/safety Grooming Details (indicate cue type and reason): VC for safety and keep one hand on sink for balance                 Toilet Transfer: Supervision/safety;Regular  Toilet;Grab bars;Ambulation   Toileting- Clothing Manipulation and Hygiene: Supervision/safety;Sit to/from stand;Cueing for sequencing;Cueing for safety   Tub/ Shower Transfer: Walk-in shower;Cueing for [employer]	[place] Transfer Details (indicate cue type and reason): encouraged pt to use grab bar- he has 2 at home Functional mobility during ADLs: Supervision/safety (pt S with opening closet and drawers - woudl benefit from a walker for fxal activity but does not want)       Vision Patient Visual Report: No change from baseline            Cognition Arousal/Alertness: Awake/alert Behavior During Therapy: WFL for tasks assessed/performed Overall Cognitive Status: Within Functional Limits for tasks assessed                                                     Pertinent Vitals/ Pain       Pain Assessment: No/denies pain  Home Living Family/patient expects to be discharged to:: Private residence Living Arrangements: Alone Available Help at Discharge: Family Type of Home: Apartment Home Access: Elevator     Home Layout: One level     Bathroom Shower/Tub: Occupational psychologist: Standard     Home Equipment: None   Additional Comments: pt states daugther  comes every day      Prior Functioning/Environment Level of Independence: Independent  Gait / Transfers Assistance Needed: pt reports ambulating without assistive device, denies falls ADL's / Homemaking Assistance Needed: daughter checks on pt each morning. pt states he bathes and dresses himself.        Frequency  Min 2X/week        Progress Toward Goals  OT Goals(current goals can now be found in the care plan section)  Progress towards OT goals: Progressing toward goals  Acute Rehab OT Goals Patient Stated Goal: go home  Plan Discharge plan remains appropriate    Co-evaluation                 AM-PAC PT "6 Clicks" Daily Activity     Outcome  Measure   Help from another person eating meals?: None Help from another person taking care of personal grooming?: None Help from another person toileting, which includes using toliet, bedpan, or urinal?: A Little Help from another person bathing (including washing, rinsing, drying)?: A Little Help from another person to put on and taking off regular upper body clothing?: None Help from another person to put on and taking off regular lower body clothing?: A Little 6 Click Score: 21    End of Session Equipment Utilized During Treatment: Gait belt  OT Visit Diagnosis: Unsteadiness on feet (R26.81);History of falling (Z91.81);Muscle weakness (generalized) (M62.81)   Activity Tolerance Patient tolerated treatment well   Patient Left in bed;with call bell/phone within reach;with bed alarm set   Nurse Communication Mobility status           Charges:   [place] .   Betsy Pries 03/26/2017, 12:02 PM

## 2017-03-26 NOTE — Progress Notes (Signed)
bs reading 547, hospitalist notified. 12 additional units of novolog given with ordered 5 units.

## 2017-03-26 NOTE — Progress Notes (Signed)
Patient ID: [name] [name], male   DOB: Feb 14, 1946, 71 y.o.   MRN: [id number]    PROGRESS NOTE  Alba Kriesel  KKX:[id number] DOB: Feb 08, 1946 DOA: 03/22/2017  PCP: [name], [name]   Brief Narrative:   HPI:  Pt is 71 yo male wit known hx of CVA, HTN, DM, last admission in April 2018 for DKA, presented for evaluation of fatigue, nausea, poor oral intake, elevated sugar levels at home. Pt also reports falling down on his righ side and suspects this to be due to weakness.   Assessment & Plan:   DKA with h/o poorly controlled DM type 2 - poor baseline control A1C 10.2 on 01/29/2017 - he was treated with insulin drip, AG closed and pt now off insulin drip, transitioned to subQ insulin  - very brittle diabetes, has significant highs and lows after off insulin drip, high 547 and low 25 last 24hrs -continue titrate insulin regimen, patient report he has an appointment with endocrinology this months. - he reports eating two meals a day at home and checks blood sugar twice a day, he is advised to check blood sugar more often at home, will benefit from getting home health RN for medication supervision and disease management, case manager notified -continue diabetes education (patient report he has diabetes for 77yrs)   Acute renal failure on CKD, stage III - BUN 72/Creatinine 2.89/GFR 23 - cr improved after iv hydration,  -continue monitor  Elbow pain - Elbow x-ray to assess for fracture - negative - OT eval  - no pain this AM   Substance use - UDS positive for cocaine - pt not sure how it got "there" - advised on cessation  Abdominal pain - from recent fall and DKA - resolved,  HyperK - resolved    H/o fall, PT /OTeval,  Per PT: "He had mild loss of balance with head turns but is adamant that he will not use an assistive device. He denies any recent falls, however H&P indicates he fell just prior to admission. He appears to be at baseline with mobility and is not agreeable to use  of DME, " Per OT" HHOT would ensure home safety but pt refuses) "  DVT prophylaxis: Lovenox SQ Code Status: DNR Family Communication: pt at bedside  Disposition Plan: home in AM if blood sugar stable  Consultants:   Diabetes RN  PT/OT  Case manager  Procedures:   None  Antimicrobials:   None  Subjective: Pt reports feeling better. But had hypoglycemia again this am, blood sugar 25  Objective: Vitals:   03/25/17 2202 03/26/17 0119 03/26/17 0611 03/26/17 0806  BP: 140/86 135/82 (!) 143/70 130/60  Pulse: (!) 56 70 69 60  Resp: 18 20 18 18   Temp: 98.3 F (36.8 C) 98.9 F (37.2 C) 97.8 F (36.6 C) 98.6 F (37 C)  TempSrc: Oral Oral Oral Oral  SpO2: 98% 100% 96% 100%  Weight:      Height:        Intake/Output Summary (Last 24 hours) at 03/26/17 1236 Last data filed at 03/26/17 0806  Gross per 24 hour  Intake              600 ml  Output             1600 ml  Net            -1000 ml   Filed Weights   03/22/17 2254  Weight: 61.2 kg (134 lb 14.7 oz)   Physical  Exam  Constitutional: NAD.  CVS: RRR, S1/S2 +, no gallops, no carotid bruit.  Pulmonary: Effort and breath sounds normal, no stridor, rhonchi, wheezes, rales.  Abdominal: Soft. BS +,  no distension, tenderness, rebound or guarding.  Musculoskeletal: Normal range of motion. No edema and no tenderness.   Data Reviewed: I have personally reviewed following labs and imaging studies  CBC:  Recent Labs Lab 03/22/17 1705 03/25/17 0749  WBC 10.5 6.7  HGB 10.3* 10.8*  HCT 31.4* 31.5*  MCV 93.5 88.7  PLT 226 409   Basic Metabolic Panel:  Recent Labs Lab 03/23/17 0016  03/23/17 1211 03/23/17 1528 03/24/17 0343 03/25/17 0749 03/26/17 0731  NA 141  < > 130* 134* 140 137 137  K 4.5  < > 5.9* 4.3 3.9 3.9 4.3  CL 111  < > 103 105 112* 104 103  CO2 22  < > 17* 17* 23 25 22   GLUCOSE 161*  < > 740* 289* 69 32* 127*  BUN 64*  < > 61* 56* 41* 26* 28*  CREATININE 2.10*  < > 2.17* 2.04* 1.36* 0.98 1.18   CALCIUM 9.2  < > 8.2* 8.6* 8.5* 9.2 8.9  MG 2.6*  --   --   --   --   --  2.3  PHOS 4.1  --   --   --   --   --   --   < > = values in this interval not displayed. Liver Function Tests:  Recent Labs Lab 03/22/17 1705  AST 26  ALT 26  ALKPHOS 120  BILITOT 1.2  PROT 8.2*  ALBUMIN 4.4   CBG:  Recent Labs Lab 03/25/17 2237 03/25/17 2359 03/26/17 0702 03/26/17 0812 03/26/17 1227  GLUCAP 547* 405* 25* 213* 85   Urine analysis:    Component Value Date/Time   COLORURINE YELLOW 03/23/2017 0217   APPEARANCEUR HAZY (A) 03/23/2017 0217   LABSPEC 1.010 03/23/2017 0217   PHURINE 5.0 03/23/2017 0217   GLUCOSEU >=500 (A) 03/23/2017 0217   HGBUR SMALL (A) 03/23/2017 0217   BILIRUBINUR NEGATIVE 03/23/2017 0217   KETONESUR 5 (A) 03/23/2017 0217   PROTEINUR NEGATIVE 03/23/2017 0217   UROBILINOGEN 0.2 01/29/2017 1041   NITRITE NEGATIVE 03/23/2017 0217   LEUKOCYTESUR TRACE (A) 03/23/2017 0217   Recent Results (from the past 240 hour(s))  MRSA PCR Screening     Status: None   Collection Time: 03/22/17  9:00 PM  Result Value Ref Range Status   MRSA by PCR NEGATIVE NEGATIVE Final    Comment:        The GeneXpert MRSA Assay (FDA approved for NASAL specimens only), is one component of a comprehensive MRSA colonization surveillance program. It is not intended to diagnose MRSA infection nor to guide or monitor treatment for MRSA infections.     Radiology Studies: No results found.  Scheduled Meds: . aspirin  81 mg Oral Daily  . carvedilol  6.25 mg Oral BID WC  . enoxaparin (LOVENOX) injection  40 mg Subcutaneous Q24H  . feeding supplement (GLUCERNA SHAKE)  237 mL Oral BID BM  . FLUoxetine  40 mg Oral Daily  . insulin aspart  0-15 Units Subcutaneous TID WC  . insulin aspart  0-5 Units Subcutaneous QHS  . insulin aspart  3 Units Subcutaneous TID WC  . insulin detemir  10 Units Subcutaneous QHS   Continuous Infusions:    LOS: 4 days   Time spent: 25 minutes    Stepen Prins, MD PhD [facility]  Pager [phone]  If 7PM-7AM, please contact night-coverage [email or web address] Password [facility] 03/26/2017, 12:36 PM

## 2017-03-26 NOTE — Progress Notes (Addendum)
Inpatient Diabetes Program Recommendations  AACE/ADA: New Consensus Statement on Inpatient Glycemic Control (2015)  Target Ranges:  Prepandial:   less than 140 mg/dL      Peak postprandial:   less than 180 mg/dL (1-2 hours)      Critically ill patients:  140 - 180 mg/dL   Results for CRIT, OBREMSKI (MRN [id number]) as of 03/26/2017 09:57  Ref. Range 03/24/2017 07:16 03/24/2017 07:48 03/24/2017 08:58 03/24/2017 11:39 03/24/2017 16:52 03/24/2017 20:20  Glucose-Capillary Latest Ref Range: 65 - 99 mg/dL 38 (LL) 69 136 (H) 204 (H) 83 397 (H)  20 units Levemir   Results for CHAI, ROUTH (MRN [id number]) as of 03/26/2017 09:57  Ref. Range 03/25/2017 07:42 03/25/2017 08:04 03/25/2017 12:00 03/25/2017 16:53 03/25/2017 22:37 03/25/2017 23:59  Glucose-Capillary Latest Ref Range: 65 - 99 mg/dL 25 (LL)  0 units Novolog 61 (L) 273 (H)  8 units Novolog 80 547 (HH)  17 units Novolog 405 (H)  20 units Levemir   Results for BRIEN, LOWE (MRN [id number]) as of 03/26/2017 09:57  Ref. Range 03/26/2017 07:02  Glucose-Capillary Latest Ref Range: 65 - 99 mg/dL 25 (LL)    Home DM Meds: Levemir 20 units QHS                             Novolog 0-15 units TID per SSI  Current Insulin Orders: Levemir 10 units QHS      Novolog Moderate Correction Scale/ SSI (0-15 units) TID AC + HS      Novolog 3 units TID with meals      MD- Note patient received 20 units Levemir at 10 pm on the evening of 06/11.  Woke up with severe Hypoglycemia on AM of 06/12 (CBG down to 38 mg/dl).    Given 20 units Levemir again on PM of 06/12 and woke up again yesterday AM (06/13) with severe Hypoglycemia (CBG down to 25 mg/dl).  Given another 20 units Levemir last PM (06/13) and woke up with severe Hypoglycemia this AM (06/14) (CBG down to 25 mg/dl).  Patient eating 75% of meals.  Having elevations after meals when he does not receive any Novolog insulin.   Note Levemir dose reduced to 10 units QHS for tonight and Novolog 3 units TID  with meals started today.    --Will follow patient during hospitalization--  Wyn Quaker RN, MSN, CDE Diabetes Coordinator Inpatient Glycemic Control Team Team Pager: [phone] (8a-5p)

## 2017-03-27 DIAGNOSIS — N183 Chronic kidney disease, stage 3 (moderate): Secondary | ICD-10-CM

## 2017-03-27 DIAGNOSIS — E0810 Diabetes mellitus due to underlying condition with ketoacidosis without coma: Secondary | ICD-10-CM

## 2017-03-27 LAB — BASIC METABOLIC PANEL
Anion gap: 8 (ref 5–15)
BUN: 32 mg/dL — ABNORMAL HIGH (ref 6–20)
CO2: 25 mmol/L (ref 22–32)
Calcium: 8.9 mg/dL (ref 8.9–10.3)
Chloride: 104 mmol/L (ref 101–111)
Creatinine, Ser: 1.24 mg/dL (ref 0.61–1.24)
GFR calc Af Amer: >60 mL/min (ref >60)
GFR calc non Af Amer: 56 mL/min — ABNORMAL LOW (ref >60)
Glucose, Bld: 43 mg/dL — CL (ref 65–99)
Potassium: 4 mmol/L (ref 3.5–5.1)
Sodium: 137 mmol/L (ref 135–145)

## 2017-03-27 LAB — GLUCOSE, CAPILLARY
Glucose-Capillary: 155 mg/dL — ABNORMAL HIGH (ref 65–99)
Glucose-Capillary: 36 mg/dL — CL (ref 65–99)
Glucose-Capillary: 49 mg/dL — ABNORMAL LOW (ref 65–99)
Glucose-Capillary: 88 mg/dL (ref 65–99)
Glucose-Capillary: 284 mg/dL — ABNORMAL HIGH (ref 65–99)
Glucose-Capillary: 148 mg/dL — ABNORMAL HIGH (ref 65–99)
Glucose-Capillary: 114 mg/dL — ABNORMAL HIGH (ref 65–99)
Glucose-Capillary: 367 mg/dL — ABNORMAL HIGH (ref 65–99)

## 2017-03-27 LAB — URIC ACID: Uric Acid, Serum: 4.5 mg/dL (ref 4.4–7.6)

## 2017-03-27 MED ORDER — INSULIN ASPART 100 UNIT/ML ~~LOC~~ SOLN
2.0000 [IU] | Freq: Three times a day (TID) | SUBCUTANEOUS | Status: DC
  Administered 2017-03-27 – 2017-03-30 (×8): 2 [IU] via SUBCUTANEOUS

## 2017-03-27 MED ORDER — INSULIN ASPART 100 UNIT/ML ~~LOC~~ SOLN
0.0000 [IU] | Freq: Three times a day (TID) | SUBCUTANEOUS | Status: DC
  Administered 2017-03-27: 1 [IU] via SUBCUTANEOUS
  Administered 2017-03-28: 5 [IU] via SUBCUTANEOUS
  Administered 2017-03-28: 1 [IU] via SUBCUTANEOUS
  Administered 2017-03-28 – 2017-03-29 (×2): 9 [IU] via SUBCUTANEOUS
  Administered 2017-03-29: 3 [IU] via SUBCUTANEOUS
  Administered 2017-03-30: 9 [IU] via SUBCUTANEOUS
  Administered 2017-03-30: 7 [IU] via SUBCUTANEOUS
  Administered 2017-03-31: 9 [IU] via SUBCUTANEOUS

## 2017-03-27 MED ORDER — INSULIN DETEMIR 100 UNIT/ML ~~LOC~~ SOLN
10.0000 [IU] | Freq: Every day | SUBCUTANEOUS | Status: DC
  Administered 2017-03-28 – 2017-03-29 (×2): 10 [IU] via SUBCUTANEOUS
  Filled 2017-03-27 (×3): qty 0.1

## 2017-03-27 MED ORDER — SODIUM CHLORIDE 0.9 % IV SOLN
INTRAVENOUS | Status: AC
  Administered 2017-03-27 – 2017-03-28 (×2): via INTRAVENOUS

## 2017-03-27 NOTE — Progress Notes (Addendum)
CRITICAL VALUE ALERT  Critical Value:  Blood glucose 43  Date & Time Notied: 03/27/2017 0735  Provider Notified: Erlinda Hong  Orders Received/Actions taken: Patient is currently eating breakfast. Will recheck after patient is done eating.   CBG rechecked at 0814 is 284

## 2017-03-27 NOTE — Progress Notes (Signed)
Patient ID: [name] [name], male   DOB: 1945/10/21, 71 y.o.   MRN: [id number]    PROGRESS NOTE  [name] [name]  UTM:[id number] DOB: 1946/04/22 DOA: 03/22/2017  PCP: [name], [name]   Brief Narrative:   HPI:  Pt is 71 yo male wit known hx of CVA, HTN, DM, last admission in April 2018 for DKA, presented for evaluation of fatigue, nausea, poor oral intake, elevated sugar levels at home. Pt also reports falling down on his righ side and suspects this to be due to weakness.   Assessment & Plan:   DKA with h/o poorly controlled DM type 2 - poor baseline control A1C 10.2 on 01/29/2017 - he was treated with insulin drip, AG closed and pt now off insulin drip, transitioned to subQ insulin   Insulin dependent dm2,  -very brittle diabetes, has significant highs and lows after off insulin drip, high 284 and low 43 last 24hrs, though he is not symptomatic, he told me he has lows at home as low as 13 and he is not symptomatic which is a dangerous situation. - he reports eating two meals a day at home and checks blood sugar twice a day, he is advised to check blood sugar more often at home, will benefit from getting home health RN for medication supervision and disease management, case manager notified -continue diabetes education, though patient reports he has diabetes for 60yrs -it seems he has significant lows in the morning, will change daily levemir to qam , decrease meal coverage to 2units with meal, ssi change to sensitive scale (Body mass index is 19.92 kg/m.)    Acute renal failure on CKD, stage III - BUN 72/Creatinine 2.89/GFR 23 - cr nadir at 0.98, now start to trending back up, will restart  iv hydration,  -continue monitor, renal dosing meds  Elbow pain - Elbow x-ray to assess for fracture - negative - OT eval  - no pain this AM   Substance use - UDS positive for cocaine - pt not sure how it got "there" - advised on cessation  Abdominal pain - from recent fall and DKA -  resolved,  HyperK - resolved    H/o fall, PT /OTeval,  Per PT: "He had mild loss of balance with head turns but is adamant that he will not use an assistive device. He denies any recent falls, however H&P indicates he fell just prior to admission. He appears to be at baseline with mobility and is not agreeable to use of DME, " Per OT" HHOT would ensure home safety but pt refuses) "  DVT prophylaxis: Lovenox SQ Code Status: DNR Family Communication: pt at bedside  Disposition Plan: home in AM if blood sugar stable, recommend home health  Consultants:   Diabetes RN  PT/OT  Case manager  Procedures:   None  Antimicrobials:   None  Subjective: Pt reports feeling better. But had hypoglycemia again this am, blood sugar 43  Objective: Vitals:   03/26/17 1200 03/26/17 1626 03/26/17 2048 03/27/17 0558  BP: 134/67 (!) 146/78 134/73 136/79  Pulse: 62 63 66 (!) 54  Resp: 18 18 20 18   Temp: 98.3 F (36.8 C) 98.5 F (36.9 C) 99.1 F (37.3 C) 98.4 F (36.9 C)  TempSrc: Oral Oral Oral Oral  SpO2: 98% 100% 99% 100%  Weight:      Height:        Intake/Output Summary (Last 24 hours) at 03/27/17 1158 Last data filed at 03/27/17 0904  Gross per  24 hour  Intake              720 ml  Output             1475 ml  Net             -755 ml   Filed Weights   03/22/17 2254  Weight: 61.2 kg (134 lb 14.7 oz)   Physical Exam  Constitutional: NAD. , pleasant CVS: RRR, S1/S2 +, no gallops, no carotid bruit.  Pulmonary: Effort and breath sounds normal, no stridor, rhonchi, wheezes, rales.  Abdominal: Soft. BS +,  no distension, tenderness, rebound or guarding.  Musculoskeletal: Normal range of motion. No edema and no tenderness.   Data Reviewed: I have personally reviewed following labs and imaging studies  CBC:  Recent Labs Lab 03/22/17 1705 03/25/17 0749  WBC 10.5 6.7  HGB 10.3* 10.8*  HCT 31.4* 31.5*  MCV 93.5 88.7  PLT 226 720   Basic Metabolic Panel:  Recent  Labs Lab 03/23/17 0016  03/23/17 1528 03/24/17 0343 03/25/17 0749 03/26/17 0731 03/27/17 0622  NA 141  < > 134* 140 137 137 137  K 4.5  < > 4.3 3.9 3.9 4.3 4.0  CL 111  < > 105 112* 104 103 104  CO2 22  < > 17* 23 25 22 25   GLUCOSE 161*  < > 289* 69 32* 127* 43*  BUN 64*  < > 56* 41* 26* 28* 32*  CREATININE 2.10*  < > 2.04* 1.36* 0.98 1.18 1.24  CALCIUM 9.2  < > 8.6* 8.5* 9.2 8.9 8.9  MG 2.6*  --   --   --   --  2.3  --   PHOS 4.1  --   --   --   --   --   --   < > = values in this interval not displayed. Liver Function Tests:  Recent Labs Lab 03/22/17 1705  AST 26  ALT 26  ALKPHOS 120  BILITOT 1.2  PROT 8.2*  ALBUMIN 4.4   CBG:  Recent Labs Lab 03/27/17 0127 03/27/17 0629 03/27/17 0641 03/27/17 0653 03/27/17 0814  GLUCAP 155* 36* 49* 88 284*   Urine analysis:    Component Value Date/Time   COLORURINE YELLOW 03/23/2017 0217   APPEARANCEUR HAZY (A) 03/23/2017 0217   LABSPEC 1.010 03/23/2017 0217   PHURINE 5.0 03/23/2017 0217   GLUCOSEU >=500 (A) 03/23/2017 0217   HGBUR SMALL (A) 03/23/2017 0217   BILIRUBINUR NEGATIVE 03/23/2017 0217   KETONESUR 5 (A) 03/23/2017 0217   PROTEINUR NEGATIVE 03/23/2017 0217   UROBILINOGEN 0.2 01/29/2017 1041   NITRITE NEGATIVE 03/23/2017 0217   LEUKOCYTESUR TRACE (A) 03/23/2017 0217   Recent Results (from the past 240 hour(s))  MRSA PCR Screening     Status: None   Collection Time: 03/22/17  9:00 PM  Result Value Ref Range Status   MRSA by PCR NEGATIVE NEGATIVE Final    Comment:        The GeneXpert MRSA Assay (FDA approved for NASAL specimens only), is one component of a comprehensive MRSA colonization surveillance program. It is not intended to diagnose MRSA infection nor to guide or monitor treatment for MRSA infections.     Radiology Studies: No results found.  Scheduled Meds: . aspirin  81 mg Oral Daily  . carvedilol  6.25 mg Oral BID WC  . enoxaparin (LOVENOX) injection  40 mg Subcutaneous Q24H  .  feeding supplement (GLUCERNA SHAKE)  237 mL Oral  BID BM  . FLUoxetine  40 mg Oral Daily  . insulin aspart  0-9 Units Subcutaneous TID WC  . insulin aspart  2 Units Subcutaneous TID WC  . [START ON 03/28/2017] insulin detemir  10 Units Subcutaneous Daily   Continuous Infusions: . sodium chloride       LOS: 5 days   Time spent: 25 minutes   [name] Sethi, MD PhD [facility] Pager [phone]  If 7PM-7AM, please contact night-coverage [email or web address] Password [facility] 03/27/2017, 11:58 AM

## 2017-03-27 NOTE — Progress Notes (Signed)
Pt bs 367 on accucheck. hospitalist notified and informed that pt had no coverage ordered at this time. No intervention ordered at this time.

## 2017-03-27 NOTE — Progress Notes (Addendum)
Inpatient Diabetes Program Recommendations  AACE/ADA: New Consensus Statement on Inpatient Glycemic Control (2015)  Target Ranges:  Prepandial:   less than 140 mg/dL      Peak postprandial:   less than 180 mg/dL (1-2 hours)      Critically ill patients:  140 - 180 mg/dL   Results for [name] [name], [name] [name] (MRN [id number]) as of 03/27/2017 08:38  Ref. Range 03/26/2017 07:02 03/26/2017 08:12 03/26/2017 12:27 03/26/2017 17:20 03/26/2017 20:58  Glucose-Capillary Latest Ref Range: 65 - 99 mg/dL 25 (LL) 213 (H)  0 units Novolog 85  0 units Novolog 295 (H)  11 units Novolog (5 units SSI + 3 units Meal Coverage) 82  10 units Levemir   Results for [name] [name], [name] [name] (MRN [id number]) as of 03/27/2017 08:38  Ref. Range 03/27/2017 06:29 03/27/2017 06:41 03/27/2017 06:53 03/27/2017 08:14  Glucose-Capillary Latest Ref Range: 65 - 99 mg/dL 36 (LL) 49 (L) 88 284 (H)    Home DM Meds: Levemir 20 units QHS Novolog 0-15 units TID per SSI  Current Insulin Orders: Levemir 10 units QHS                                       Novolog Moderate Correction Scale/ SSI (0-15 units) TID AC + HS                                       Novolog 3 units TID with meals      MD- Note patient received 20 units Levemir at 10 pm on the evening of 06/11. Woke up with severe Hypoglycemia on AM of 06/12 (CBG down to 38 mg/dl).   Given 20 units Levemir again on PM of 06/12 and woke up again yesterday AM (06/13) with severe Hypoglycemia (CBG down to 25 mg/dl).  Given another 20 units Levemir PM of 06/13 and woke up with severe Hypoglycemia yesteday AM (06/14) (CBG down to 25 mg/dl).  Given 10 units Levemir last PM (06/14) and woke up Hypoglycemic again this AM (CBG 36 mg/dl).  Patient eating 75% of meals. Having elevations after meals when he does not receive any Novolog insulin.    Very labile CBGs noted.    MD- Please consider the following in-hospital insulin adjustments:  1. Decrease Levemir to  7 units QHS  2. Decrease Novolog SSI to Sensitive scale (0-9 units) TID AC + HS  3. Decrease Novolog Meal Coverage to: Novolog 2 units TID with meals (hold if pt eats <50% of meal)     --Will follow patient during hospitalization--  Wyn Quaker RN, MSN, CDE Diabetes Coordinator Inpatient Glycemic Control Team Team Pager: [phone] (8a-5p)

## 2017-03-27 NOTE — Progress Notes (Signed)
Pt bs has been dropping in the am, reading 36 this morning, given orange juice and rechecked bs 49, pt was fed peanut butter crackers and blood sugar was rechecked, reading 88. Will continue to monitor at this time.

## 2017-03-28 LAB — BASIC METABOLIC PANEL
Anion gap: 10 (ref 5–15)
BUN: 32 mg/dL — ABNORMAL HIGH (ref 6–20)
CO2: 22 mmol/L (ref 22–32)
Calcium: 8.8 mg/dL — ABNORMAL LOW (ref 8.9–10.3)
Chloride: 103 mmol/L (ref 101–111)
Creatinine, Ser: 1.29 mg/dL — ABNORMAL HIGH (ref 0.61–1.24)
GFR calc Af Amer: >60 mL/min (ref >60)
GFR calc non Af Amer: 54 mL/min — ABNORMAL LOW (ref >60)
Glucose, Bld: 280 mg/dL — ABNORMAL HIGH (ref 65–99)
Potassium: 4.1 mmol/L (ref 3.5–5.1)
Sodium: 135 mmol/L (ref 135–145)

## 2017-03-28 LAB — URINALYSIS, MICROSCOPIC (REFLEX)
RBC / HPF: NONE SEEN RBC/hpf (ref 0–5)
Squamous Epithelial / LPF: NONE SEEN

## 2017-03-28 LAB — URINALYSIS, ROUTINE W REFLEX MICROSCOPIC
Bilirubin Urine: NEGATIVE
Glucose, UA: >=500 mg/dL — AB
Hgb urine dipstick: NEGATIVE
Leukocytes, UA: NEGATIVE
Nitrite: NEGATIVE
Protein, ur: NEGATIVE mg/dL
Specific Gravity, Urine: 1.01 (ref 1.005–1.030)
pH: 6.5 (ref 5.0–8.0)

## 2017-03-28 LAB — GLUCOSE, CAPILLARY
Glucose-Capillary: 429 mg/dL — ABNORMAL HIGH (ref 65–99)
Glucose-Capillary: 388 mg/dL — ABNORMAL HIGH (ref 65–99)
Glucose-Capillary: 261 mg/dL — ABNORMAL HIGH (ref 65–99)
Glucose-Capillary: 133 mg/dL — ABNORMAL HIGH (ref 65–99)
Glucose-Capillary: 130 mg/dL — ABNORMAL HIGH (ref 65–99)

## 2017-03-28 MED ORDER — SODIUM CHLORIDE 0.9 % IV SOLN
INTRAVENOUS | Status: DC
  Administered 2017-03-28 – 2017-03-30 (×3): via INTRAVENOUS

## 2017-03-28 MED ORDER — INSULIN ASPART 100 UNIT/ML ~~LOC~~ SOLN
5.0000 [IU] | Freq: Once | SUBCUTANEOUS | Status: AC
  Administered 2017-03-28: 5 [IU] via SUBCUTANEOUS

## 2017-03-28 MED ORDER — HYDRALAZINE HCL 20 MG/ML IJ SOLN
10.0000 mg | Freq: Once | INTRAMUSCULAR | Status: AC
  Administered 2017-03-28: 10 mg via INTRAVENOUS
  Filled 2017-03-28: qty 0.5

## 2017-03-28 MED ORDER — INSULIN DETEMIR 100 UNIT/ML ~~LOC~~ SOLN
5.0000 [IU] | Freq: Every day | SUBCUTANEOUS | Status: DC
  Administered 2017-03-28 – 2017-03-30 (×2): 5 [IU] via SUBCUTANEOUS
  Filled 2017-03-28 (×4): qty 0.05

## 2017-03-28 NOTE — Progress Notes (Signed)
Requesting Ensure. Checked blood sugar reading 429. CNA states BP 176/85 with pulse at 60, rechecked BP reading 177/89 with pulse at 60. Hospitalist notified. Apresoline and 5 units of insulin ordered, will administer when verified by pharmacy.

## 2017-03-28 NOTE — Progress Notes (Signed)
Patient ID: [name] [name], male   DOB: Jul 29, 1946, 70 y.o.   MRN: [id number]    PROGRESS NOTE  [name] [name]  YJE:[id number] DOB: 11-13-1945 DOA: 03/22/2017  PCP: [name], [name]   Brief Narrative:   HPI:  Pt is 71 yo male wit known hx of CVA, HTN, DM, last admission in April 2018 for DKA, presented for evaluation of fatigue, nausea, poor oral intake, elevated sugar levels at home. Pt also reports falling down on his righ side and suspects this to be due to weakness.   Assessment & Plan:   DKA with h/o poorly controlled DM type 2 - poor baseline control A1C 10.2 on 01/29/2017 - he was treated with insulin drip, AG closed and pt now off insulin drip, transitioned to subQ insulin   Insulin dependent dm2, BMI 19.92kg/m2 -very brittle diabetes, has significant highs and lows after off insulin drip,  though he is not symptomatic, he told me he has lows at home as low as 13 and he is not symptomatic which is a dangerous situation. - he reports eating two meals a day at home and checks blood sugar twice a day, he is advised to check blood sugar more often at home, will benefit from getting home health RN for medication supervision and disease management, case manager notified -he also report drinking ensure at home -continue diabetes education, though patient reports he has diabetes for 23yrs -no significant lows last 24hrs, now blood sugar is running high, continue titrate insulin, will change long acting insulin to bid dosing, continue ssi    Acute renal failure on CKD, stage III - BUN 72/Creatinine 2.89/GFR 23 - cr nadir at 0.98, now start to trending back up, cr1.29 today on 6/16,  restart  iv hydration, check ua ( he denies urinary symptom) -continue monitor, renal dosing meds  Elbow pain - Elbow x-ray to assess for fracture - negative - OT eval  - no pain this AM   Substance use - UDS positive for cocaine - pt not sure how it got "there" - advised on cessation  Abdominal  pain - from recent fall and DKA - resolved,  HyperK - resolved    H/o fall, PT /OTeval,  Per PT: "He had mild loss of balance with head turns but is adamant that he will not use an assistive device. He denies any recent falls, however H&P indicates he fell just prior to admission. He appears to be at baseline with mobility and is not agreeable to use of DME, " Per OT" HHOT would ensure home safety but pt refuses) "  DVT prophylaxis: Lovenox SQ Code Status: DNR Family Communication: pt at bedside  Disposition Plan: home in AM if blood sugar stable, recommend home health  Consultants:   Diabetes RN  PT/OT  Case manager  Procedures:   None  Antimicrobials:   None  Subjective: blood sugar now elevated around 300-400, he Did not have hypoglycemia event last night or this am, cr worsening He denies discomfort, very pleasant  Objective: Vitals:   03/27/17 1353 03/27/17 2057 03/28/17 0108 03/28/17 0457  BP: 125/68 134/73 (!) 176/85 (!) 145/80  Pulse: 63 65 66 66  Resp: 18 18 20 18   Temp: 98.5 F (36.9 C) 97.6 F (36.4 C) 97.8 F (36.6 C) 97.4 F (36.3 C)  TempSrc: Oral Oral Oral Oral  SpO2: 100% 100% 98% 98%  Weight:      Height:        Intake/Output Summary (Last 24  hours) at 03/28/17 0743 Last data filed at 03/28/17 0651  Gross per 24 hour  Intake           1832.5 ml  Output             2925 ml  Net          -1092.5 ml   Filed Weights   03/22/17 2254  Weight: 61.2 kg (134 lb 14.7 oz)   Physical Exam  Constitutional: NAD. , pleasant CVS: RRR, S1/S2 +, no gallops, no carotid bruit.  Pulmonary: Effort and breath sounds normal, no stridor, rhonchi, wheezes, rales.  Abdominal: Soft. BS +,  no distension, tenderness, rebound or guarding.  Musculoskeletal: Normal range of motion. No edema and no tenderness.   Data Reviewed: I have personally reviewed following labs and imaging studies  CBC:  Recent Labs Lab 03/22/17 1705 03/25/17 0749  WBC 10.5 6.7    HGB 10.3* 10.8*  HCT 31.4* 31.5*  MCV 93.5 88.7  PLT 226 144   Basic Metabolic Panel:  Recent Labs Lab 03/23/17 0016  03/24/17 0343 03/25/17 0749 03/26/17 0731 03/27/17 0622 03/28/17 0509  NA 141  < > 140 137 137 137 135  K 4.5  < > 3.9 3.9 4.3 4.0 4.1  CL 111  < > 112* 104 103 104 103  CO2 22  < > 23 25 22 25 22   GLUCOSE 161*  < > 69 32* 127* 43* 280*  BUN 64*  < > 41* 26* 28* 32* 32*  CREATININE 2.10*  < > 1.36* 0.98 1.18 1.24 1.29*  CALCIUM 9.2  < > 8.5* 9.2 8.9 8.9 8.8*  MG 2.6*  --   --   --  2.3  --   --   PHOS 4.1  --   --   --   --   --   --   < > = values in this interval not displayed. Liver Function Tests:  Recent Labs Lab 03/22/17 1705  AST 26  ALT 26  ALKPHOS 120  BILITOT 1.2  PROT 8.2*  ALBUMIN 4.4   CBG:  Recent Labs Lab 03/27/17 1159 03/27/17 1650 03/27/17 2052 03/28/17 0116 03/28/17 0726  GLUCAP 148* 114* 367* 429* 388*   Urine analysis:    Component Value Date/Time   COLORURINE YELLOW 03/23/2017 0217   APPEARANCEUR HAZY (A) 03/23/2017 0217   LABSPEC 1.010 03/23/2017 0217   PHURINE 5.0 03/23/2017 0217   GLUCOSEU >=500 (A) 03/23/2017 0217   HGBUR SMALL (A) 03/23/2017 0217   BILIRUBINUR NEGATIVE 03/23/2017 0217   KETONESUR 5 (A) 03/23/2017 0217   PROTEINUR NEGATIVE 03/23/2017 0217   UROBILINOGEN 0.2 01/29/2017 1041   NITRITE NEGATIVE 03/23/2017 0217   LEUKOCYTESUR TRACE (A) 03/23/2017 0217   Recent Results (from the past 240 hour(s))  MRSA PCR Screening     Status: None   Collection Time: 03/22/17  9:00 PM  Result Value Ref Range Status   MRSA by PCR NEGATIVE NEGATIVE Final    Comment:        The GeneXpert MRSA Assay (FDA approved for NASAL specimens only), is one component of a comprehensive MRSA colonization surveillance program. It is not intended to diagnose MRSA infection nor to guide or monitor treatment for MRSA infections.     Radiology Studies: No results found.  Scheduled Meds: . aspirin  81 mg Oral Daily   . carvedilol  6.25 mg Oral BID WC  . enoxaparin (LOVENOX) injection  40 mg Subcutaneous Q24H  .  feeding supplement (GLUCERNA SHAKE)  237 mL Oral BID BM  . FLUoxetine  40 mg Oral Daily  . insulin aspart  0-9 Units Subcutaneous TID WC  . insulin aspart  2 Units Subcutaneous TID WC  . insulin detemir  10 Units Subcutaneous Daily  . insulin detemir  5 Units Subcutaneous QHS   Continuous Infusions: . sodium chloride 75 mL/hr at 03/28/17 0031  . sodium chloride       LOS: 6 days   Time spent: 35 minutes   Analea Muller, MD PhD [facility] Pager [phone]  If 7PM-7AM, please contact night-coverage [email or web address] Password [facility] 03/28/2017, 7:43 AM

## 2017-03-29 DIAGNOSIS — N170 Acute kidney failure with tubular necrosis: Secondary | ICD-10-CM

## 2017-03-29 DIAGNOSIS — R7989 Other specified abnormal findings of blood chemistry: Secondary | ICD-10-CM

## 2017-03-29 LAB — BASIC METABOLIC PANEL
Anion gap: 8 (ref 5–15)
BUN: 27 mg/dL — ABNORMAL HIGH (ref 6–20)
CO2: 20 mmol/L — ABNORMAL LOW (ref 22–32)
Calcium: 8.3 mg/dL — ABNORMAL LOW (ref 8.9–10.3)
Chloride: 105 mmol/L (ref 101–111)
Creatinine, Ser: 1.18 mg/dL (ref 0.61–1.24)
GFR calc Af Amer: >60 mL/min (ref >60)
GFR calc non Af Amer: 60 mL/min — ABNORMAL LOW (ref >60)
Glucose, Bld: 386 mg/dL — ABNORMAL HIGH (ref 65–99)
Potassium: 4.7 mmol/L (ref 3.5–5.1)
Sodium: 133 mmol/L — ABNORMAL LOW (ref 135–145)

## 2017-03-29 LAB — GLUCOSE, CAPILLARY
Glucose-Capillary: 379 mg/dL — ABNORMAL HIGH (ref 65–99)
Glucose-Capillary: 213 mg/dL — ABNORMAL HIGH (ref 65–99)
Glucose-Capillary: 112 mg/dL — ABNORMAL HIGH (ref 65–99)
Glucose-Capillary: 26 mg/dL — CL (ref 65–99)
Glucose-Capillary: 155 mg/dL — ABNORMAL HIGH (ref 65–99)

## 2017-03-29 MED ORDER — AMOXICILLIN-POT CLAVULANATE 500-125 MG PO TABS
1.0000 | ORAL_TABLET | Freq: Two times a day (BID) | ORAL | Status: DC
  Administered 2017-03-29 – 2017-03-30 (×3): 500 mg via ORAL
  Filled 2017-03-29 (×3): qty 1

## 2017-03-29 NOTE — Progress Notes (Signed)
Patient ID: [name] [name], male   DOB: 1945-12-30, 71 y.o.   MRN: [id number]    PROGRESS NOTE  [name] [name]  EXH:[id number] DOB: 25-May-1946 DOA: 03/22/2017  PCP: [name], [name]   Brief Narrative:   HPI:  Pt is 71 yo male wit known hx of CVA, HTN, DM, last admission in April 2018 for DKA, presented for evaluation of fatigue, nausea, poor oral intake, elevated sugar levels at home. Pt also reports falling down on his righ side and suspects this to be due to weakness.   Assessment & Plan:   DKA with h/o poorly controlled DM type 2 - poor baseline control A1C 10.2 on 01/29/2017 - he was treated with insulin drip, AG closed and pt now off insulin drip, transitioned to subQ insulin   Insulin dependent dm2, BMI 19.92kg/m2 -very brittle diabetes, has significant highs and lows after off insulin drip,  though he is not symptomatic, he told me he has lows at home as low as 13 and he is not symptomatic which is a dangerous situation. - he reports eating two meals a day at home and checks blood sugar twice a day, he is advised to check blood sugar more often at home, will benefit from getting home health RN for medication supervision and disease management, case manager notified -he also report drinking ensure at home -continue diabetes education, though patient reports he has diabetes for 65yrs -no significant lows last 24hrs, now blood sugar is running high, continue titrate insulin, now on  long acting insulin to bid dosing, continue ssi, hopefully can d/c in am    Acute renal failure on CKD, stage III - BUN 72/Creatinine 2.89/GFR 23 - cr nadir at 0.98, now start to trending back up, cr1.29 today on 6/16,  restart  iv hydration,  ua + many bacteria, + g-rods, suspect urinary tract infection also contribute to difficult control blood sugar ( he denies urinary symptom), will start augmentin -continue monitor, renal dosing meds  Elbow pain - Elbow x-ray to assess for fracture - negative -  OT eval  - no pain this AM   Substance use - UDS positive for cocaine - pt not sure how it got "there" - advised on cessation  Abdominal pain - from recent fall and DKA - resolved,  HyperK - resolved    H/o fall, PT /OTeval,  Per PT: "He had mild loss of balance with head turns but is adamant that he will not use an assistive device. He denies any recent falls, however H&P indicates he fell just prior to admission. He appears to be at baseline with mobility and is not agreeable to use of DME, " Per OT" HHOT would ensure home safety but pt refuses) "  DVT prophylaxis: Lovenox SQ Code Status: DNR Family Communication: pt at bedside  Disposition Plan: home in AM if blood sugar stable, recommend home health  Consultants:   Diabetes RN  PT/OT  Case manager  Procedures:   None  Antimicrobials:   None  Subjective: No more hypoglycemia, blood sugar still elevated,386 this am, cr still elevated above baseline, but seems improving, he is continued on ivf  He denies discomfort, very pleasant  Objective: Vitals:   03/29/17 0000 03/29/17 0356 03/29/17 0800 03/29/17 1320  BP: 136/65 (!) 158/75 (!) 167/67 (!) 158/80  Pulse: 69 61 61 65  Resp: 18 18 18 18   Temp: 98.7 F (37.1 C) 98.7 F (37.1 C) 98.5 F (36.9 C) 98.3 F (36.8 C)  TempSrc: Oral Oral Oral Oral  SpO2: 98% 99% 98% 100%  Weight:   62.2 kg (137 lb 1.6 oz)   Height:        Intake/Output Summary (Last 24 hours) at 03/29/17 1530 Last data filed at 03/29/17 6629  Gross per 24 hour  Intake             1230 ml  Output             1350 ml  Net             -120 ml   Filed Weights   03/22/17 2254 03/28/17 1308 03/29/17 0800  Weight: 61.2 kg (134 lb 14.7 oz) 62.2 kg (137 lb 2 oz) 62.2 kg (137 lb 1.6 oz)   Physical Exam  Constitutional: NAD. , pleasant CVS: RRR, S1/S2 +, no gallops, no carotid bruit.  Pulmonary: Effort and breath sounds normal, no stridor, rhonchi, wheezes, rales.  Abdominal: Soft. BS +,   no distension, tenderness, rebound or guarding.  Musculoskeletal: Normal range of motion. No edema and no tenderness.   Data Reviewed: I have personally reviewed following labs and imaging studies  CBC:  Recent Labs Lab 03/22/17 1705 03/25/17 0749  WBC 10.5 6.7  HGB 10.3* 10.8*  HCT 31.4* 31.5*  MCV 93.5 88.7  PLT 226 476   Basic Metabolic Panel:  Recent Labs Lab 03/23/17 0016  03/25/17 0749 03/26/17 0731 03/27/17 0622 03/28/17 0509 03/29/17 0659  NA 141  < > 137 137 137 135 133*  K 4.5  < > 3.9 4.3 4.0 4.1 4.7  CL 111  < > 104 103 104 103 105  CO2 22  < > 25 22 25 22  20*  GLUCOSE 161*  < > 32* 127* 43* 280* 386*  BUN 64*  < > 26* 28* 32* 32* 27*  CREATININE 2.10*  < > 0.98 1.18 1.24 1.29* 1.18  CALCIUM 9.2  < > 9.2 8.9 8.9 8.8* 8.3*  MG 2.6*  --   --  2.3  --   --   --   PHOS 4.1  --   --   --   --   --   --   < > = values in this interval not displayed. Liver Function Tests:  Recent Labs Lab 03/22/17 1705  AST 26  ALT 26  ALKPHOS 120  BILITOT 1.2  PROT 8.2*  ALBUMIN 4.4   CBG:  Recent Labs Lab 03/28/17 1107 03/28/17 1709 03/28/17 2205 03/29/17 0703 03/29/17 1127  GLUCAP 261* 133* 130* 379* 213*   Urine analysis:    Component Value Date/Time   COLORURINE YELLOW 03/28/2017 0852   APPEARANCEUR HAZY (A) 03/28/2017 0852   LABSPEC 1.010 03/28/2017 0852   PHURINE 6.5 03/28/2017 0852   GLUCOSEU >=500 (A) 03/28/2017 0852   HGBUR NEGATIVE 03/28/2017 0852   BILIRUBINUR NEGATIVE 03/28/2017 0852   KETONESUR TRACE (A) 03/28/2017 0852   PROTEINUR NEGATIVE 03/28/2017 0852   UROBILINOGEN 0.2 01/29/2017 1041   NITRITE NEGATIVE 03/28/2017 0852   LEUKOCYTESUR NEGATIVE 03/28/2017 0852   Recent Results (from the past 240 hour(s))  MRSA PCR Screening     Status: None   Collection Time: 03/22/17  9:00 PM  Result Value Ref Range Status   MRSA by PCR NEGATIVE NEGATIVE Final    Comment:        The GeneXpert MRSA Assay (FDA approved for NASAL  specimens only), is one component of a comprehensive MRSA colonization surveillance program. It is not intended  to diagnose MRSA infection nor to guide or monitor treatment for MRSA infections.   Culture, Urine     Status: Abnormal (Preliminary result)   Collection Time: 03/28/17  8:52 AM  Result Value Ref Range Status   Specimen Description URINE, RANDOM  Final   Special Requests NONE  Final   Culture >=100,000 COLONIES/mL GRAM NEGATIVE RODS (A)  Final   Report Status PENDING  Incomplete    Radiology Studies: No results found.  Scheduled Meds: . amoxicillin-clavulanate  1 tablet Oral BID  . aspirin  81 mg Oral Daily  . carvedilol  6.25 mg Oral BID WC  . enoxaparin (LOVENOX) injection  40 mg Subcutaneous Q24H  . feeding supplement (GLUCERNA SHAKE)  237 mL Oral BID BM  . FLUoxetine  40 mg Oral Daily  . insulin aspart  0-9 Units Subcutaneous TID WC  . insulin aspart  2 Units Subcutaneous TID WC  . insulin detemir  10 Units Subcutaneous Daily  . insulin detemir  5 Units Subcutaneous QHS   Continuous Infusions: . sodium chloride 75 mL/hr at 03/29/17 1446     LOS: 7 days   Time spent: 25 minutes   Cade Dashner, MD PhD [facility] Pager [phone]  If 7PM-7AM, please contact night-coverage [email or web address] Password [email or web address] 03/29/2017, 3:30 PM

## 2017-03-30 LAB — CBC WITH DIFFERENTIAL/PLATELET
Basophils Absolute: 0 10*3/uL (ref 0.0–0.1)
Basophils Relative: 0 %
Eosinophils Absolute: 0.5 10*3/uL (ref 0.0–0.7)
Eosinophils Relative: 6 %
HCT: 28.9 % — ABNORMAL LOW (ref 39.0–52.0)
Hemoglobin: 9.9 g/dL — ABNORMAL LOW (ref 13.0–17.0)
Lymphocytes Relative: 34 %
Lymphs Abs: 2.8 10*3/uL (ref 0.7–4.0)
MCH: 30.4 pg (ref 26.0–34.0)
MCHC: 34.3 g/dL (ref 30.0–36.0)
MCV: 88.7 fL (ref 78.0–100.0)
Monocytes Absolute: 0.7 10*3/uL (ref 0.1–1.0)
Monocytes Relative: 9 %
Neutro Abs: 4.2 10*3/uL (ref 1.7–7.7)
Neutrophils Relative %: 51 %
Platelets: 249 10*3/uL (ref 150–400)
RBC: 3.26 MIL/uL — ABNORMAL LOW (ref 4.22–5.81)
RDW: 13.3 % (ref 11.5–15.5)
WBC: 8.2 10*3/uL (ref 4.0–10.5)

## 2017-03-30 LAB — URINE CULTURE: Culture: >=100000 — AB

## 2017-03-30 LAB — BASIC METABOLIC PANEL
Anion gap: 6 (ref 5–15)
BUN: 23 mg/dL — ABNORMAL HIGH (ref 6–20)
CO2: 23 mmol/L (ref 22–32)
Calcium: 8.5 mg/dL — ABNORMAL LOW (ref 8.9–10.3)
Chloride: 104 mmol/L (ref 101–111)
Creatinine, Ser: 1.13 mg/dL (ref 0.61–1.24)
GFR calc Af Amer: >60 mL/min (ref >60)
GFR calc non Af Amer: >60 mL/min (ref >60)
Glucose, Bld: 414 mg/dL — ABNORMAL HIGH (ref 65–99)
Potassium: 4.5 mmol/L (ref 3.5–5.1)
Sodium: 133 mmol/L — ABNORMAL LOW (ref 135–145)

## 2017-03-30 LAB — GLUCOSE, CAPILLARY
Glucose-Capillary: 487 mg/dL — ABNORMAL HIGH (ref 65–99)
Glucose-Capillary: 317 mg/dL — ABNORMAL HIGH (ref 65–99)
Glucose-Capillary: 80 mg/dL (ref 65–99)
Glucose-Capillary: 126 mg/dL — ABNORMAL HIGH (ref 65–99)

## 2017-03-30 LAB — MAGNESIUM: Magnesium: 2.3 mg/dL (ref 1.7–2.4)

## 2017-03-30 MED ORDER — INSULIN ASPART 100 UNIT/ML ~~LOC~~ SOLN
3.0000 [IU] | Freq: Three times a day (TID) | SUBCUTANEOUS | Status: DC
  Administered 2017-03-30 – 2017-03-31 (×2): 3 [IU] via SUBCUTANEOUS

## 2017-03-30 MED ORDER — CEPHALEXIN 500 MG PO CAPS
500.0000 mg | ORAL_CAPSULE | Freq: Two times a day (BID) | ORAL | Status: DC
  Administered 2017-03-30 – 2017-03-31 (×2): 500 mg via ORAL
  Filled 2017-03-30 (×2): qty 1

## 2017-03-30 MED ORDER — INSULIN DETEMIR 100 UNIT/ML ~~LOC~~ SOLN
5.0000 [IU] | Freq: Every day | SUBCUTANEOUS | Status: DC
  Administered 2017-03-30 – 2017-03-31 (×2): 5 [IU] via SUBCUTANEOUS
  Filled 2017-03-30 (×2): qty 0.05

## 2017-03-30 NOTE — Progress Notes (Signed)
MD notified CBG 487.

## 2017-03-30 NOTE — Progress Notes (Signed)
Nutrition Follow-up/Consult  RD consulted regarding review of diabetes diet.  Diet education provided previously in this admission on 6/14. Per RN, does not feel pt understands the diet.   Pt is still ordering juice on trays per meal ordering records. CBGs today: [id number].  Lab Results  Component Value Date   HGBA1C 10.2 01/29/2017    RD discussed importance of controlled and consistent carbohydrate intake throughout the day. Patient reports not having any questions about diet. Pt very eager to leave and states he is discharging today. RN came into room and stated that patient will not discharge today given his CBGs.  RD reviewed the importance of pairing protein foods with carbohydrate foods together. Patient is drinking the Glucerna shakes.   Expect poor compliance. Recommend follow-up by Certified Diabetes Educator. Patient states he would be interested in outpatient education as well.  Body mass index is 20.25 kg/m. Pt meets criteria for normal based on current BMI.  Current diet order is CHO modified, patient is consuming approximately 95-100% of meals at this time. Labs and medications reviewed. No further nutrition interventions warranted at this time.If additional nutrition issues arise, please re-consult RD.  [name] Bibles, MS, RD, LDN Pager: [phone] After Hours Pager: [phone]

## 2017-03-30 NOTE — Progress Notes (Signed)
OT Cancellation Note  Patient Details Name: [name] [name] MRN: [id number] DOB: Jan 02, 1946   Cancelled Treatment:    Reason Eval/Treat Not Completed: Other (comment); Pt declining OT treatment session at this time, reporting his leg has been hurting and is preferring to rest at this time. Will check back at a later date.  [name], OT Pager [phone] 03/30/2017   [name] 03/30/2017, 4:08 PM

## 2017-03-30 NOTE — Progress Notes (Signed)
Patient ID: [name] [name], male   DOB: 22-Nov-1945, 71 y.o.   MRN: [id number]    PROGRESS NOTE  [name] [name]  OAC:[id number] DOB: August 07, 1946 DOA: 03/22/2017  PCP: [name], [name]   Brief Narrative:   HPI:  Pt is 71 yo male wit known hx of CVA, HTN, DM, last admission in April 2018 for DKA, presented for evaluation of fatigue, nausea, poor oral intake, elevated sugar levels at home. Pt also reports falling down on his righ side and suspects this to be due to weakness.   Assessment & Plan:   DKA with h/o poorly controlled DM type 2 - poor baseline control A1C 10.2 on 01/29/2017 - he was treated with insulin drip, AG closed and pt now off insulin drip, transitioned to subQ insulin   Insulin dependent dm2, BMI 19.92kg/m2 -very brittle diabetes, has significant highs and lows after off insulin drip,  though he is not symptomatic, he told me he has lows at home as low as 13 and he is not symptomatic which is a dangerous situation. - he reports eating two meals a day at home and checks blood sugar twice a day, he is advised to check blood sugar more often at home, will benefit from getting home health RN for medication supervision and disease management, case manager notified -he also report drinking ensure at home -continue diabetes education, though patient reports he has diabetes for 59yrs -unfortunately he has another hypoglycemia yesterday evening , blood sugar 26, this am blood sugar elevated, will need to continue adjust insulin, will change levemir to 5units bid, increase meal coverage insulin ( he eat 100% of his meal)   Acute renal failure on CKD, stage III - BUN 72/Creatinine 2.89/GFR 23 - cr nadir at 0.98, now start to trending back up, cr1.29 today on 6/16,  restart  iv hydration,  ua + many bacteria, + g-rods, suspect urinary tract infection also contribute to difficult control blood sugar ( he denies urinary symptom), start augmentin, change to keflex per sensitivity  result -continue monitor, renal dosing meds  Elbow pain - Elbow x-ray to assess for fracture - negative - OT eval  - no pain this AM   Substance use - UDS positive for cocaine - pt not sure how it got "there" - advised on cessation  Abdominal pain - from recent fall and DKA - resolved,  HyperK - resolved    H/o fall, PT /OTeval,  Per PT: "He had mild loss of balance with head turns but is adamant that he will not use an assistive device. He denies any recent falls, however H&P indicates he fell just prior to admission. He appears to be at baseline with mobility and is not agreeable to use of DME, " Per OT" HHOT would ensure home safety but pt refuses) "  DVT prophylaxis: Lovenox SQ Code Status: DNR Family Communication: pt at bedside  Disposition Plan: home in AM if blood sugar stable, recommend home health  Consultants:   Diabetes RN  PT/OT  Case manager  Procedures:   None  Antimicrobials:   None  Subjective: Had hypoglycemia 26 yesterday evening, , blood sugar still elevated at 487 this am , cr still elevated above baseline, but seems improving, he is continued on ivf  He denies discomfort, very pleasant  Objective: Vitals:   03/29/17 0800 03/29/17 1320 03/29/17 2131 03/30/17 0537  BP: (!) 167/67 (!) 158/80 (!) 172/80 (!) 172/79  Pulse: 61 65 60 63  Resp: 18 18 17  18  Temp: 98.5 F (36.9 C) 98.3 F (36.8 C) 98.4 F (36.9 C) 98.3 F (36.8 C)  TempSrc: Oral Oral Oral Oral  SpO2: 98% 100% 98% 96%  Weight: 62.2 kg (137 lb 1.6 oz)     Height:        Intake/Output Summary (Last 24 hours) at 03/30/17 0821 Last data filed at 03/30/17 0444  Gross per 24 hour  Intake             1380 ml  Output             1300 ml  Net               80 ml   Filed Weights   03/22/17 2254 03/28/17 1308 03/29/17 0800  Weight: 61.2 kg (134 lb 14.7 oz) 62.2 kg (137 lb 2 oz) 62.2 kg (137 lb 1.6 oz)   Physical Exam  Constitutional: NAD. , pleasant CVS: RRR, S1/S2  +, no gallops, no carotid bruit.  Pulmonary: Effort and breath sounds normal, no stridor, rhonchi, wheezes, rales.  Abdominal: Soft. BS +,  no distension, tenderness, rebound or guarding.  Musculoskeletal: Normal range of motion. No edema and no tenderness.   Data Reviewed: I have personally reviewed following labs and imaging studies  CBC:  Recent Labs Lab 03/25/17 0749 03/30/17 0523  WBC 6.7 8.2  NEUTROABS  --  4.2  HGB 10.8* 9.9*  HCT 31.5* 28.9*  MCV 88.7 88.7  PLT 180 101   Basic Metabolic Panel:  Recent Labs Lab 03/26/17 0731 03/27/17 0622 03/28/17 0509 03/29/17 0659 03/30/17 0523  NA 137 137 135 133* 133*  K 4.3 4.0 4.1 4.7 4.5  CL 103 104 103 105 104  CO2 22 25 22  20* 23  GLUCOSE 127* 43* 280* 386* 414*  BUN 28* 32* 32* 27* 23*  CREATININE 1.18 1.24 1.29* 1.18 1.13  CALCIUM 8.9 8.9 8.8* 8.3* 8.5*  MG 2.3  --   --   --  2.3   Liver Function Tests: No results for input(s): AST, ALT, ALKPHOS, BILITOT, PROT, ALBUMIN in the last 168 hours. CBG:  Recent Labs Lab 03/29/17 1127 03/29/17 1714 03/29/17 2118 03/29/17 2221 03/30/17 0746  GLUCAP 213* 112* 26* 155* 487*   Urine analysis:    Component Value Date/Time   COLORURINE YELLOW 03/28/2017 0852   APPEARANCEUR HAZY (A) 03/28/2017 0852   LABSPEC 1.010 03/28/2017 0852   PHURINE 6.5 03/28/2017 0852   GLUCOSEU >=500 (A) 03/28/2017 0852   HGBUR NEGATIVE 03/28/2017 0852   BILIRUBINUR NEGATIVE 03/28/2017 0852   KETONESUR TRACE (A) 03/28/2017 0852   PROTEINUR NEGATIVE 03/28/2017 0852   UROBILINOGEN 0.2 01/29/2017 1041   NITRITE NEGATIVE 03/28/2017 0852   LEUKOCYTESUR NEGATIVE 03/28/2017 0852   Recent Results (from the past 240 hour(s))  MRSA PCR Screening     Status: None   Collection Time: 03/22/17  9:00 PM  Result Value Ref Range Status   MRSA by PCR NEGATIVE NEGATIVE Final    Comment:        The GeneXpert MRSA Assay (FDA approved for NASAL specimens only), is one component of a comprehensive  MRSA colonization surveillance program. It is not intended to diagnose MRSA infection nor to guide or monitor treatment for MRSA infections.   Culture, Urine     Status: Abnormal   Collection Time: 03/28/17  8:52 AM  Result Value Ref Range Status   Specimen Description URINE, RANDOM  Final   Special Requests NONE  Final  Culture >=100,000 COLONIES/mL KLEBSIELLA PNEUMONIAE (A)  Final   Report Status 03/30/2017 FINAL  Final   Organism ID, Bacteria KLEBSIELLA PNEUMONIAE (A)  Final      Susceptibility   Klebsiella pneumoniae - MIC*    AMPICILLIN RESISTANT Resistant     CEFAZOLIN <=4 SENSITIVE Sensitive     CEFTRIAXONE <=1 SENSITIVE Sensitive     CIPROFLOXACIN <=0.25 SENSITIVE Sensitive     GENTAMICIN <=1 SENSITIVE Sensitive     IMIPENEM <=0.25 SENSITIVE Sensitive     NITROFURANTOIN 32 SENSITIVE Sensitive     TRIMETH/SULFA <=20 SENSITIVE Sensitive     AMPICILLIN/SULBACTAM 4 SENSITIVE Sensitive     PIP/TAZO <=4 SENSITIVE Sensitive     Extended ESBL NEGATIVE Sensitive     * >=100,000 COLONIES/mL KLEBSIELLA PNEUMONIAE    Radiology Studies: No results found.  Scheduled Meds: . amoxicillin-clavulanate  1 tablet Oral BID  . aspirin  81 mg Oral Daily  . carvedilol  6.25 mg Oral BID WC  . enoxaparin (LOVENOX) injection  40 mg Subcutaneous Q24H  . feeding supplement (GLUCERNA SHAKE)  237 mL Oral BID BM  . FLUoxetine  40 mg Oral Daily  . insulin aspart  0-9 Units Subcutaneous TID WC  . insulin aspart  3 Units Subcutaneous TID WC  . insulin detemir  5 Units Subcutaneous QHS  . insulin detemir  5 Units Subcutaneous Daily   Continuous Infusions: . sodium chloride 75 mL/hr at 03/29/17 1446     LOS: 8 days   Time spent: 25 minutes   Bensyn Bornemann, MD PhD [facility] Pager [phone]  If 7PM-7AM, please contact night-coverage [email or web address] Password [email or web address] 03/30/2017, 8:21 AM

## 2017-03-31 LAB — GLUCOSE, CAPILLARY
Glucose-Capillary: 63 mg/dL — ABNORMAL LOW (ref 65–99)
Glucose-Capillary: 382 mg/dL — ABNORMAL HIGH (ref 65–99)

## 2017-03-31 LAB — BASIC METABOLIC PANEL
Anion gap: 10 (ref 5–15)
BUN: 24 mg/dL — ABNORMAL HIGH (ref 6–20)
CO2: 21 mmol/L — ABNORMAL LOW (ref 22–32)
Calcium: 8.5 mg/dL — ABNORMAL LOW (ref 8.9–10.3)
Chloride: 105 mmol/L (ref 101–111)
Creatinine, Ser: 1.2 mg/dL (ref 0.61–1.24)
GFR calc Af Amer: >60 mL/min (ref >60)
GFR calc non Af Amer: 59 mL/min — ABNORMAL LOW (ref >60)
Glucose, Bld: 214 mg/dL — ABNORMAL HIGH (ref 65–99)
Potassium: 4.6 mmol/L (ref 3.5–5.1)
Sodium: 136 mmol/L (ref 135–145)

## 2017-03-31 MED ORDER — INSULIN DETEMIR 100 UNIT/ML FLEXPEN: 5.0000 [IU] | PEN_INJECTOR | Freq: Two times a day (BID) | SUBCUTANEOUS | 0 refills | Status: DC

## 2017-03-31 MED ORDER — CEPHALEXIN 500 MG PO CAPS: 500.0000 mg | ORAL_CAPSULE | Freq: Two times a day (BID) | ORAL | 0 refills | Status: AC

## 2017-03-31 MED ORDER — INSULIN ASPART 100 UNIT/ML FLEXPEN: PEN_INJECTOR | SUBCUTANEOUS | 0 refills | Status: DC

## 2017-03-31 NOTE — Progress Notes (Signed)
Pts IV removed with a clean dressing intact. Pt denies pain at this time with no s/s of distress. Pt transported to the front entrance via wheelchair to meet his ride with nursing staff present.

## 2017-03-31 NOTE — Care Management Important Message (Signed)
Important Message  Patient Details  Name: [name] [name] MRN: [id number] Date of Birth: September 06, 1946   Medicare Important Message Given:  Yes    Kerin Salen 03/31/2017, 11:52 AMImportant Message  Patient Details  Name: [name] [name] MRN: [id number] Date of Birth: 08-31-1946   Medicare Important Message Given:  Yes    Kerin Salen 03/31/2017, 11:52 AM

## 2017-03-31 NOTE — Discharge Summary (Addendum)
Discharge Summary  [name] [name] GEX:[id number] DOB: [birth year]  PCP: [name], [name]  Admit date: 03/22/2017 Discharge date: 03/31/2017  Time spent: >73mns, more than 50% time spent on coordination of care and patient/family counseling.   Recommendations for Outpatient Follow-up:  1. F/u with PMD within a week  for hospital discharge follow up, repeat cbc/bmp at follow up 2. F/u with endocrinology in two weeks 3.  Amb Referral to Nutrition and Diabetic E  Complete by: As directed   Nutrition and Diabetes Education Services  [phone]  [id number]   Consultation     Discharge Diagnoses:  Active Hospital Problems   Diagnosis Date Noted  . DKA (diabetic ketoacidosis) ([facility] 03/22/2017  . Elbow pain, right 03/22/2017  . Abdominal pain due to injury 03/22/2017  . Acute renal failure superimposed on stage 3 chronic kidney disease ([facility]     Resolved Hospital Problems   Diagnosis Date Noted Date Resolved  No resolved problems to display.    Discharge Condition: stable  Diet recommendation: heart healthy/carb modified  Filed Weights   03/22/17 2254 03/28/17 1308 03/29/17 0800  Weight: 61.2 kg (134 lb 14.7 oz) 62.2 kg (137 lb 2 oz) 62.2 kg (137 lb 1.6 oz)    History of present illness:  PCP: HScot Jun [name] Consultants:  None Patient coming from: Home - lives alone but his daughter comes and stay with him sometimes; NOK: daughter  Chief Complaint: hyperglycemia  HPI: [name] Bradburnis a 71y.o. male with medical history significant of CVA, HTN, DM, and last hospitalization for DKA from 4/19 (admitted by me) through 4/22.  He reports "I took too much insulin yesterday.  I forgot what I take."  When asked why taking too much insulin would lead to elevated blood sugars, he is not sure why his sugar is high.  Denies somnolence, but acknowledges mild confusion.  He reports that this is the first time this has happened.  R  elbow pain and abdominal pain after a fall yesterday (hit the shower head).  No urinary symptoms. He reports he was feeling fine until today or yesterday.  He was previously hospitalized from 4/19-22 for DKA and acute renal failure;  4/6-10 for acute encephalopathy and sepsis due to a Klebsiella UTI; and from 3/18-20 for DKA (CO2 on that admission was 10 and glucose was 820, with creatinine 4.18). He was hospitalized from 3/3-7 for hypoglycemia due to accidental insulin overdose and from 2/4-8/18 and 12/20-26/17 for DKA.  . ED Course: DKA - K > 3.5; started on IVF and insulin drip.  ARF with BUN 72/Cr 3.8.  Hospital Course:  Principal Problem:   D[facility] (diabetic ketoacidosis) ([facility] Active Problems:   Acute renal failure superimposed on stage 3 chronic kidney disease ([name])   Elbow pain, right   Abdominal pain due to injury   DKA with h/o poorly controlled DM type 2 - poor baseline control A1C 10.2 on 01/29/2017 - he was treated with insulin drip, AG closed and pt now off insulin drip, transitioned to subQ insulin   Insulin dependent dm2, BMI 19.92kg/m2, a1c 10 -very brittle diabetes, has significant highs and lows after off insulin drip,  though he is not symptomatic, he told me he has lows at home as low as 13 and he is not symptomatic which is a dangerous situation. - he reports eating two meals a day at home and checks blood sugar twice a day, he  is advised to check blood sugar at least four times a day home health RN for medication supervision and disease management, case manager notified. -he also report drinking ensure at home, he orders orange juice often, continue diabetes education, referral to outpatient diabetes education as well.  though patient reports he has diabetes for 51yr -he is discharged on levemir to 5units bid, meal coverage insulin 3untis and sliding scale insulin. He is advised to follow up with pmd and endocrinologist closely.    Acute renal failure on CKD, stage  III - BUN 72/Creatinine 2.89/GFR 23 - cr nadir at 0.98, now start to trending back up, cr1.29 today on 6/16,  restart  iv hydration,  urine culture + klebsiella pneumoniae, suspect urinary tract infection also contribute to difficult control blood sugar ( he denies urinary symptom), start augmentin, change to keflex per sensitivity result, he is discharge on keflex for additional three days to finish total of 5days treatment. -pmd to continue monitor  Elbowpain - Elbow x-rayto assess for fracture - negative - OT eval  - no pain this AM   Substance use - UDS positive for cocaine - pt not sure how it got "there" - advised on cessation  Abdominal pain - from recent fall and DKA - resolved,  HyperK - resolved    H/o fall, PT /OTeval,  Per PT: "He had mild loss of balance with head turns but is adamant that he will not use an assistive device. He denies any recent falls, however H&P indicates he fell just prior to admission. He appears to be at baseline with mobility and is not agreeable to use of DME, " Home health PT/OT.  DVT prophylaxis while in the hospital: Lovenox SQ Code Status: DNR Family Communication: pt at bedside , talked to patient's sister over the phone, left message for patient's daughter on her phone Disposition Plan: home with  home health for diabetes control , disease management, very difficult to control blood sugar, with significant highs and lows  Consultants:   Diabetes RN  PT/OT  Case manager  Procedures:   None  Antimicrobials:   Augmentin then keflex   Discharge Exam: BP 130/74 (BP Location: Right Arm)   Pulse 92   Temp 98.4 F (36.9 C) (Oral)   Resp 18   Ht '5\' 9"'  (1.753 m)   Wt 62.2 kg (137 lb 1.6 oz)   SpO2 94%   BMI 20.25 kg/m   General: NAD, pleasant Cardiovascular: RRR Respiratory: CTABL  Discharge Instructions You were cared for by a hospitalist during your hospital stay. If you have any questions about your  discharge medications or the care you received while you were in the hospital after you are discharged, you can call the unit and asked to speak with the hospitalist on call if the hospitalist that took care of you is not available. Once you are discharged, your primary care physician will handle any further medical issues. Please note that NO REFILLS for any discharge medications will be authorized once you are discharged, as it is imperative that you return to your primary care physician (or establish a relationship with a primary care physician if you do not have one) for your aftercare needs so that they can reassess your need for medications and monitor your lab values.  Discharge Instructions    Amb Referral to Nutrition and Diabetic E    Complete by:  As directed    Diet - low sodium heart healthy    Complete by:  As directed    Carb modified   Discharge instructions    Complete by:  As directed    Please check your blood sugar at least 4times a day, once in the morning when you wake up, once at night before go to bed, you are also advised to check your blood sugar immediately before each meal and 2hrs after each meal. Please bring in your blood sugar testing to your primary care doctors appointment and your endocrinologist appointment.   Face-to-face encounter (required for Medicare/Medicaid patients)    Complete by:  As directed    I Temeca Somma certify that this patient is under my care and that I, or a nurse practitioner or physician's assistant working with me, had a face-to-face encounter that meets the physician face-to-face encounter requirements with this patient on 03/25/2017. The encounter with the patient was in whole, or in part for the following medical condition(s) which is the primary reason for home health care (List medical condition): FTT, brittle diabetes, RN for disease management. Diabetic education.   The encounter with the patient was in whole, or in part, for the following  medical condition, which is the primary reason for home health care:  FTT   I certify that, based on my findings, the following services are medically necessary home health services:   Nursing Physical therapy     Reason for Medically Necessary Home Health Services:  Skilled Nursing- Change/Decline in Patient Status   My clinical findings support the need for the above services:  OTHER SEE COMMENTS   Further, I certify that my clinical findings support that this patient is homebound due to:  Immunocompromised   Home Health    Complete by:  As directed    To provide the following care/treatments:   PT OT RN Social work     Increase activity slowly    Complete by:  As directed      Allergies as of 03/31/2017   No Known Allergies     Medication List    TAKE these medications   acetaminophen 325 MG tablet Commonly known as:  TYLENOL Take 2 tablets (650 mg total) by mouth every 6 (six) hours as needed for mild pain (or Fever >/= 101).   amLODipine 10 MG tablet Commonly known as:  NORVASC Take 1 tablet (10 mg total) by mouth daily.   aspirin 81 MG EC tablet Take 1 tablet (81 mg total) by mouth daily.   blood glucose meter kit and supplies Dispense based on patient and insurance preference. Use up to four times daily as directed. (FOR ICD-9 250.00, 250.01).   carvedilol 6.25 MG tablet Commonly known as:  COREG Take 1 tablet (6.25 mg total) by mouth 2 (two) times daily with a meal.   cephALEXin 500 MG capsule Commonly known as:  KEFLEX Take 1 capsule (500 mg total) by mouth every 12 (twelve) hours.   feeding supplement (GLUCERNA SHAKE) Liqd Take 237 mLs by mouth 2 (two) times daily between meals.   FLUoxetine 40 MG capsule Commonly known as:  PROZAC Take 1 capsule (40 mg total) by mouth daily.   HYDROcodone-acetaminophen 5-325 MG tablet Commonly known as:  NORCO/VICODIN Take 1 tablet by mouth every 6 (six) hours as needed for moderate pain or severe pain.   insulin  aspart 100 UNIT/ML FlexPen Commonly known as:  NOVOLOG FLEXPEN 0-15 Units, Subcutaneous, 3 times daily with meals CBG < 70: implement hypoglycemia protocol CBG 70 - 120: 0 units CBG 121 - 150: 2  units CBG 151 - 200: 3 units CBG 201 - 250: 5 units CBG 251 - 300: 8 units CBG 301 - 350: 11 units CBG 351 - 400: 15 units CBG > 400: call MD What changed:  Another medication with the same name was added. Make sure you understand how and when to take each.   insulin aspart 100 UNIT/ML FlexPen Commonly known as:  NOVOLOG FLEXPEN 3units with each meal What changed:  You were already taking a medication with the same name, and this prescription was added. Make sure you understand how and when to take each.   Insulin Detemir 100 UNIT/ML Pen Commonly known as:  LEVEMIR Inject 5 Units into the skin 2 (two) times daily. What changed:  how much to take  when to take this   Insulin Pen Needle 31G X 5 MM Misc Use as needed to inject insulin as prescribed   vitamin B-12 1000 MCG tablet Commonly known as:  CYANOCOBALAMIN Take 1 tablet (1,000 mcg total) by mouth daily.      No Known Allergies Follow-up Information    [name], [name] Follow up in 1 week(s).   Specialty:  Family Medicine Why:  hospital discharge follow up, repeat cbc/bmp at follow up, please continue to work with your doctor regardig blood sugar control. Contact information: [address] [place] [address] [phone]        please follow with endocrinologist for diabetes Follow up in 2 week(s).            The results of significant diagnostics from this hospitalization (including imaging, microbiology, ancillary and laboratory) are listed below for reference.    Significant Diagnostic Studies: Dg Elbow Complete Right (3+view)  Result Date: 03/22/2017 CLINICAL DATA:  Right elbow pain after falling. EXAM: RIGHT ELBOW - COMPLETE 3+ VIEW COMPARISON:  None. FINDINGS: There is no evidence of fracture,  dislocation, or joint effusion. There is no evidence of arthropathy or other focal bone abnormality. Soft tissues are unremarkable. Corticated fragment posterior to the olecranon process is favored to be chronic. IMPRESSION: No acute fracture or dislocation of the right elbow. Electronically Signed   By: Ulyses Jarred M.D.   On: 03/22/2017 19:47    Microbiology: Recent Results (from the past 240 hour(s))  MRSA PCR Screening     Status: None   Collection Time: 03/22/17  9:00 PM  Result Value Ref Range Status   MRSA by PCR NEGATIVE NEGATIVE Final    Comment:        The GeneXpert MRSA Assay (FDA approved for NASAL specimens only), is one component of a comprehensive MRSA colonization surveillance program. It is not intended to diagnose MRSA infection nor to guide or monitor treatment for MRSA infections.   Culture, Urine     Status: Abnormal   Collection Time: 03/28/17  8:52 AM  Result Value Ref Range Status   Specimen Description URINE, RANDOM  Final   Special Requests NONE  Final   Culture >=100,000 COLONIES/mL KLEBSIELLA PNEUMONIAE (A)  Final   Report Status 03/30/2017 FINAL  Final   Organism ID, Bacteria KLEBSIELLA PNEUMONIAE (A)  Final      Susceptibility   Klebsiella pneumoniae - MIC*    AMPICILLIN RESISTANT Resistant     CEFAZOLIN <=4 SENSITIVE Sensitive     CEFTRIAXONE <=1 SENSITIVE Sensitive     CIPROFLOXACIN <=0.25 SENSITIVE Sensitive     GENTAMICIN <=1 SENSITIVE Sensitive     IMIPENEM <=0.25 SENSITIVE Sensitive     NITROFURANTOIN  32 SENSITIVE Sensitive     TRIMETH/SULFA <=20 SENSITIVE Sensitive     AMPICILLIN/SULBACTAM 4 SENSITIVE Sensitive     PIP/TAZO <=4 SENSITIVE Sensitive     Extended ESBL NEGATIVE Sensitive     * >=100,000 COLONIES/mL KLEBSIELLA PNEUMONIAE     Labs: Basic Metabolic Panel:  Recent Labs Lab 03/26/17 0731 03/27/17 0622 03/28/17 0509 03/29/17 0659 03/30/17 0523 03/31/17 0838  NA 137 137 135 133* 133* 136  K 4.3 4.0 4.1 4.7 4.5 4.6    CL 103 104 103 105 104 105  CO2 '22 25 22 ' 20* 23 21*  GLUCOSE 127* 43* 280* 386* 414* 214*  BUN 28* 32* 32* 27* 23* 24*  CREATININE 1.18 1.24 1.29* 1.18 1.13 1.20  CALCIUM 8.9 8.9 8.8* 8.3* 8.5* 8.5*  MG 2.3  --   --   --  2.3  --    Liver Function Tests: No results for input(s): AST, ALT, ALKPHOS, BILITOT, PROT, ALBUMIN in the last 168 hours. No results for input(s): LIPASE, AMYLASE in the last 168 hours. No results for input(s): AMMONIA in the last 168 hours. CBC:  Recent Labs Lab 03/25/17 0749 03/30/17 0523  WBC 6.7 8.2  NEUTROABS  --  4.2  HGB 10.8* 9.9*  HCT 31.5* 28.9*  MCV 88.7 88.7  PLT 180 249   Cardiac Enzymes: No results for input(s): CKTOTAL, CKMB, CKMBINDEX, TROPONINI in the last 168 hours. BNP: BNP (last 3 results) No results for input(s): BNP in the last 8760 hours.  ProBNP (last 3 results) No results for input(s): PROBNP in the last 8760 hours.  CBG:  Recent Labs Lab 03/30/17 0746 03/30/17 1145 03/30/17 1630 03/30/17 2124 03/31/17 0727  GLUCAP 487* 317* 80 126* 63*       Signed:  Catrinia Racicot MD, PhD  [facility] 03/31/2017, 11:21 AM

## 2017-03-31 NOTE — Progress Notes (Addendum)
Date:  March 31, 2017 Chart reviewed for concurrent status and case management needs. Will continue to follow patient progress. Culture, Urine [[id number]] (Abnormal)  Collected: 03/28/17 0852  Specimen: Urine from Urine, Random Updated: 03/30/17 0732   Specimen Description URINE, RANDOM   Special Requests NONE   Culture >=100,000 COLONIES/mL KLEBSIELLA PNEUMONIAE (A)   Report Status 03/30/2017 FINAL   Organism ID, Bacteria KLEBSIELLA PNEUMONIAE (A)    Discharge Planning: following for needs Expected discharge date: [id number] [name], [place], [place], [place]

## 2017-04-02 DIAGNOSIS — E161 Other hypoglycemia: Secondary | ICD-10-CM | POA: Diagnosis not present

## 2017-04-02 DIAGNOSIS — R739 Hyperglycemia, unspecified: Secondary | ICD-10-CM | POA: Diagnosis not present

## 2017-04-07 ENCOUNTER — Ambulatory Visit: Payer: Medicare Other | Admitting: Family Medicine

## 2017-04-14 ENCOUNTER — Other Ambulatory Visit: Payer: Self-pay | Admitting: Family

## 2017-04-27 ENCOUNTER — Emergency Department (HOSPITAL_COMMUNITY): Payer: Medicare Other

## 2017-04-27 ENCOUNTER — Ambulatory Visit: Payer: Medicare Other | Admitting: Family Medicine

## 2017-04-27 ENCOUNTER — Encounter (HOSPITAL_COMMUNITY): Payer: Self-pay

## 2017-04-27 ENCOUNTER — Emergency Department (HOSPITAL_COMMUNITY)
Admission: EM | Admit: 2017-04-27 | Discharge: 2017-04-27 | Disposition: A | Discharge status: Home / Self Care | Payer: Medicare Other | Attending: Emergency Medicine | Admitting: Emergency Medicine

## 2017-04-27 DIAGNOSIS — E162 Hypoglycemia, unspecified: Secondary | ICD-10-CM | POA: Diagnosis not present

## 2017-04-27 DIAGNOSIS — R402431 Glasgow coma scale score 3-8, in the field [EMT or ambulance]: Secondary | ICD-10-CM | POA: Diagnosis not present

## 2017-04-27 DIAGNOSIS — Z79899 Other long term (current) drug therapy: Secondary | ICD-10-CM | POA: Diagnosis not present

## 2017-04-27 DIAGNOSIS — N183 Chronic kidney disease, stage 3 (moderate): Secondary | ICD-10-CM | POA: Diagnosis not present

## 2017-04-27 DIAGNOSIS — Z8673 Personal history of transient ischemic attack (TIA), and cerebral infarction without residual deficits: Secondary | ICD-10-CM | POA: Insufficient documentation

## 2017-04-27 DIAGNOSIS — R4182 Altered mental status, unspecified: Secondary | ICD-10-CM | POA: Diagnosis present

## 2017-04-27 DIAGNOSIS — R739 Hyperglycemia, unspecified: Secondary | ICD-10-CM | POA: Diagnosis not present

## 2017-04-27 DIAGNOSIS — Z794 Long term (current) use of insulin: Secondary | ICD-10-CM | POA: Insufficient documentation

## 2017-04-27 DIAGNOSIS — F17210 Nicotine dependence, cigarettes, uncomplicated: Secondary | ICD-10-CM | POA: Insufficient documentation

## 2017-04-27 DIAGNOSIS — I129 Hypertensive chronic kidney disease with stage 1 through stage 4 chronic kidney disease, or unspecified chronic kidney disease: Secondary | ICD-10-CM | POA: Diagnosis not present

## 2017-04-27 LAB — BASIC METABOLIC PANEL
Anion gap: 10 (ref 5–15)
BUN: 41 mg/dL — ABNORMAL HIGH (ref 6–20)
CO2: 21 mmol/L — ABNORMAL LOW (ref 22–32)
Calcium: 8.8 mg/dL — ABNORMAL LOW (ref 8.9–10.3)
Chloride: 101 mmol/L (ref 101–111)
Creatinine, Ser: 1.49 mg/dL — ABNORMAL HIGH (ref 0.61–1.24)
GFR calc Af Amer: 52 mL/min — ABNORMAL LOW (ref >60)
GFR calc non Af Amer: 45 mL/min — ABNORMAL LOW (ref >60)
Glucose, Bld: 373 mg/dL — ABNORMAL HIGH (ref 65–99)
Potassium: 4.8 mmol/L (ref 3.5–5.1)
Sodium: 132 mmol/L — ABNORMAL LOW (ref 135–145)

## 2017-04-27 LAB — URINALYSIS, ROUTINE W REFLEX MICROSCOPIC
Bilirubin Urine: NEGATIVE
Glucose, UA: >=500 mg/dL — AB
Ketones, ur: NEGATIVE mg/dL
Leukocytes, UA: NEGATIVE
Nitrite: NEGATIVE
Protein, ur: 30 mg/dL — AB
RBC / HPF: NONE SEEN RBC/hpf (ref 0–5)
Specific Gravity, Urine: 1.008 (ref 1.005–1.030)
Squamous Epithelial / LPF: NONE SEEN
pH: 5 (ref 5.0–8.0)

## 2017-04-27 LAB — CBG MONITORING, ED
Glucose-Capillary: 144 mg/dL — ABNORMAL HIGH (ref 65–99)
Glucose-Capillary: 425 mg/dL — ABNORMAL HIGH (ref 65–99)
Glucose-Capillary: 330 mg/dL — ABNORMAL HIGH (ref 65–99)
Glucose-Capillary: 203 mg/dL — ABNORMAL HIGH (ref 65–99)
Glucose-Capillary: 199 mg/dL — ABNORMAL HIGH (ref 65–99)

## 2017-04-27 LAB — CBC WITH DIFFERENTIAL/PLATELET
Basophils Absolute: 0 10*3/uL (ref 0.0–0.1)
Basophils Relative: 0 %
Eosinophils Absolute: 0.2 10*3/uL (ref 0.0–0.7)
Eosinophils Relative: 2 %
HCT: 31.5 % — ABNORMAL LOW (ref 39.0–52.0)
Hemoglobin: 10.3 g/dL — ABNORMAL LOW (ref 13.0–17.0)
Lymphocytes Relative: 11 %
Lymphs Abs: 1.4 10*3/uL (ref 0.7–4.0)
MCH: 29.3 pg (ref 26.0–34.0)
MCHC: 32.7 g/dL (ref 30.0–36.0)
MCV: 89.5 fL (ref 78.0–100.0)
Monocytes Absolute: 0.7 10*3/uL (ref 0.1–1.0)
Monocytes Relative: 6 %
Neutro Abs: 10.6 10*3/uL — ABNORMAL HIGH (ref 1.7–7.7)
Neutrophils Relative %: 81 %
Platelets: 293 10*3/uL (ref 150–400)
RBC: 3.52 MIL/uL — ABNORMAL LOW (ref 4.22–5.81)
RDW: 13.1 % (ref 11.5–15.5)
WBC: 12.8 10*3/uL — ABNORMAL HIGH (ref 4.0–10.5)

## 2017-04-27 MED ORDER — SODIUM CHLORIDE 0.9 % IV BOLUS (SEPSIS)
500.0000 mL | Freq: Once | INTRAVENOUS | Status: AC
  Administered 2017-04-27: 500 mL via INTRAVENOUS

## 2017-04-27 NOTE — ED Notes (Signed)
XR at bedside

## 2017-04-27 NOTE — ED Provider Notes (Signed)
[facility] DEPT Provider Note   CSN: [id number] Arrival date & time: 04/27/17  1506     History   Chief Complaint Chief Complaint  Patient presents with  . Hypoglycemia    HPI [name] [name] is a 71 y.o. male.  HPI   Pt with hx CVA, CKD, DM s/p recent admission for DKA who lives alone at home and has chronic confusion p/w episode of hypoglycemia.  Daughter reports she went by his house as usual at 4am to give him his medication and make sure he ate breakfast.  She called him several times during the morning to check on him, last at 12pm when he was normal.  At 1:30pm he did not answer the phone, she found him "out of it" in bed with a cbg of 22.  Pt currently feels like he is "getting better."  He and daughter deny any recent sick symptoms.     Past Medical History:  Diagnosis Date  . 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 Closed comminuted intertrochanteric fracture of left femur ([place])   . Diabetes mellitus without complication ([place])   . Diabetic hyperosmolar non-ketotic state ([place]) 05/15/2016  . DKA (diabetic ketoacidoses) ([place]) 05/09/2016  . Hypertension   . Postoperative anemia due to acute blood loss 06/18/2016  . Retroperitoneal hematoma 06/18/2016  . Stroke ([place])   . Vitamin B 12 deficiency 06/18/2016    Patient Active Problem List   Diagnosis Date Noted  . DKA (diabetic ketoacidosis) ([place]) 03/22/2017  . Elbow pain, right 03/22/2017  . Abdominal pain due to injury 03/22/2017  . Rib pain on left side 01/29/2017  . Dehydration   . Urinary tract infection without hematuria   . Hyperkalemia 01/16/2017  . Acute metabolic encephalopathy 35/57/3220  . Elevated lactic acid level   . AKI (acute kidney injury) ([place]) 12/14/2016  . Hypoglycemia   . Sepsis ([place]) 12/13/2016  . Uncontrolled type 2 diabetes mellitus with complication ([place])   . Acute renal failure superimposed on stage 3 chronic kidney disease ([place])   .  Noncompliance with medication regimen 08/17/2016  . History of DVT (deep vein thrombosis) 07/06/2016  . Retroperitoneal hematoma 06/18/2016  . Vitamin B 12 deficiency 06/18/2016  . Hyperglycemia 05/15/2016  . Anxiety 05/15/2016  . History of CVA (cerebrovascular accident) 05/15/2016  . Hypoglycemia, coma ([place]) 11/02/2015  . Narcotic dependency, continuous ([place]) 11/02/2015  . Uncontrolled type 2 diabetes mellitus with diabetic nephropathy, with long-term current use of insulin ([place]) 08/12/2015  . Depression 08/12/2015  . CKD (chronic kidney disease) stage 3, GFR 30-59 ml/min 03/20/2014    Past Surgical History:  Procedure Laterality Date  . HIP ARTHROPLASTY Right 02/05/2013   Procedure: ARTHROPLASTY BIPOLAR HIP;  Surgeon: [name], MD;  Location: [facility];  Service: Orthopedics;  Laterality: Right;  . INTRAMEDULLARY (IM) NAIL INTERTROCHANTERIC Left 06/16/2016   Procedure: INTRAMEDULLARY (IM) NAIL INTERTROCHANTRIC;  Surgeon: Rod Can, MD;  Location: [facility];  Service: Orthopedics;  Laterality: Left;       Home Medications    Prior to Admission medications   Medication Sig Start Date End Date Taking? Authorizing Provider  amLODipine (NORVASC) 10 MG tablet Take 1 tablet (10 mg total) by mouth daily. 01/30/17  Yes [name], [name]  aspirin 81 MG EC tablet Take 1 tablet (81 mg total) by mouth daily. 01/29/17  Yes [name], [name]  carvedilol (COREG) 6.25 MG tablet Take 1 tablet (6.25 mg total) by mouth  2 (two) times daily with a meal. 01/30/17  Yes [name], [name]  FLUoxetine (PROZAC) 40 MG capsule Take 1 capsule (40 mg total) by mouth daily. 01/30/17  Yes [name], [name]  Insulin Detemir (LEVEMIR) 100 UNIT/ML Pen Inject 5 Units into the skin 2 (two) times daily. Patient taking differently: Inject 10 Units into the skin at bedtime.  03/31/17  Yes Florencia Reasons, MD  vitamin B-12 (CYANOCOBALAMIN) 1000 MCG tablet Take 1 tablet (1,000 mcg total) by mouth daily.  01/20/17  Yes Barton Dubois, MD  acetaminophen (TYLENOL) 325 MG tablet Take 2 tablets (650 mg total) by mouth every 6 (six) hours as needed for mild pain (or Fever >/= 101). Patient not taking: Reported on 03/22/2017 01/20/17   Barton Dubois, MD  blood glucose meter kit and supplies Dispense based on patient and insurance preference. Use up to four times daily as directed. (FOR ICD-9 250.00, 250.01). 01/20/17   Barton Dubois, MD  feeding supplement, GLUCERNA SHAKE, (GLUCERNA SHAKE) LIQD Take 237 mLs by mouth 2 (two) times daily between meals. Patient not taking: Reported on 04/27/2017 02/01/17   Mariel Aloe, MD  HYDROcodone-acetaminophen (NORCO/VICODIN) 5-325 MG tablet Take 1 tablet by mouth every 6 (six) hours as needed for moderate pain or severe pain. Patient not taking: Reported on 04/27/2017 02/01/17   Mariel Aloe, MD  insulin aspart (NOVOLOG FLEXPEN) 100 UNIT/ML FlexPen 0-15 Units, Subcutaneous, 3 times daily with meals CBG < 70: implement hypoglycemia protocol CBG 70 - 120: 0 units CBG 121 - 150: 2 units CBG 151 - 200: 3 units CBG 201 - 250: 5 units CBG 251 - 300: 8 units CBG 301 - 350: 11 units CBG 351 - 400: 15 units CBG > 400: call MD Patient not taking: Reported on 04/27/2017 01/29/17   [name], [name]  insulin aspart (NOVOLOG FLEXPEN) 100 UNIT/ML FlexPen 3units with each meal Patient not taking: Reported on 04/27/2017 03/31/17   Florencia Reasons, MD  Insulin Pen Needle 31G X 5 MM MISC Use as needed to inject insulin as prescribed 01/20/17   Barton Dubois, MD    Family History Family History  Problem Relation Age of Onset  . Diabetes Mother   . Alzheimer's disease Mother   . Hypertension Mother   . Hyperlipidemia Father   . Hypertension Father   . Healthy Maternal Grandmother   . Pneumonia Maternal Grandfather     Social History Social History  Substance Use Topics  . Smoking status: Current Some Day Smoker    Packs/day: 0.25    Years: 30.00    Types: Cigarettes     Last attempt to quit: 01/31/2013  . Smokeless tobacco: Never Used     Comment: "some days I light one", requests a nicotine patch  . Alcohol use No     Allergies   Patient has no known allergies.   Review of Systems Review of Systems  Unable to perform ROS: Mental status change     Physical Exam Updated Vital Signs BP 129/76   Pulse (!) 59   Temp 98.3 F (36.8 C) (Oral)   Resp 16   Ht '5\' 9"'  (1.753 m)   Wt 61.2 kg (135 lb)   SpO2 100%   BMI 19.94 kg/m   Physical Exam  Constitutional: He appears well-developed and well-nourished. No distress.  HENT:  Head: Normocephalic and atraumatic.  Neck: Neck supple.  Cardiovascular: Normal rate and regular rhythm.   Pulmonary/Chest: Effort normal and breath sounds normal.  No respiratory distress. He has no wheezes. He has no rales.  Abdominal: Soft. He exhibits no distension and no mass. There is no tenderness. There is no rebound and no guarding.  Musculoskeletal: He exhibits no edema.  Neurological: He is alert. He exhibits normal muscle tone.  Skin: He is not diaphoretic.  Nursing note and vitals reviewed.    ED Treatments / Results  Labs (all labs ordered are listed, but only abnormal results are displayed) Labs Reviewed  BASIC METABOLIC PANEL - Abnormal; Notable for the following:       Result Value   Sodium 132 (*)    CO2 21 (*)    Glucose, Bld 373 (*)    BUN 41 (*)    Creatinine, Ser 1.49 (*)    Calcium 8.8 (*)    GFR calc non Af Amer 45 (*)    GFR calc Af Amer 52 (*)    All other components within normal limits  CBC WITH DIFFERENTIAL/PLATELET - Abnormal; Notable for the following:    WBC 12.8 (*)    RBC 3.52 (*)    Hemoglobin 10.3 (*)    HCT 31.5 (*)    Neutro Abs 10.6 (*)    All other components within normal limits  URINALYSIS, ROUTINE W REFLEX MICROSCOPIC - Abnormal; Notable for the following:    Glucose, UA >=500 (*)    Hgb urine dipstick SMALL (*)    Protein, ur 30 (*)    Bacteria, UA MANY (*)      All other components within normal limits  CBG MONITORING, ED - Abnormal; Notable for the following:    Glucose-Capillary 144 (*)    All other components within normal limits  CBG MONITORING, ED - Abnormal; Notable for the following:    Glucose-Capillary 425 (*)    All other components within normal limits  CBG MONITORING, ED - Abnormal; Notable for the following:    Glucose-Capillary 330 (*)    All other components within normal limits  CBG MONITORING, ED - Abnormal; Notable for the following:    Glucose-Capillary 203 (*)    All other components within normal limits  CBG MONITORING, ED - Abnormal; Notable for the following:    Glucose-Capillary 199 (*)    All other components within normal limits  CBG MONITORING, ED    EKG  EKG Interpretation None       Radiology Dg Chest Portable 1 View  Result Date: 04/27/2017 CLINICAL DATA:  Hyperglycemia EXAM: PORTABLE CHEST 1 VIEW COMPARISON:  01/29/2017 FINDINGS: The heart size and mediastinal contours are within normal limits. Both lungs are clear. The visualized skeletal structures are unremarkable. IMPRESSION: No active disease. Electronically Signed   By: Ashley Royalty M.D.   On: 04/27/2017 19:50    Procedures Procedures (including critical care time)  Medications Ordered in ED Medications  sodium chloride 0.9 % bolus 500 mL (0 mLs Intravenous Stopped 04/27/17 1817)     Initial Impression / Assessment and Plan / ED Course  I have reviewed the triage vital signs and the nursing notes.  Pertinent labs & imaging results that were available during my care of the patient were reviewed by me and considered in my medical decision making (see chart for details).  Clinical Course as of Apr 28 1041  Mon Apr 27, 2017  2029 I spoke with Roderic Palau, Education officer, museum, who will send information for daughter regarding SNFs and respite care.    I attempted to call daughter at [phone] and there was  no answer.  I left a HIPPA-appropriate  message  [EW]  2048 I spoke with patient's sister who will attempted to contact the daughter.    [EW]  2055 I spoke with Darcel Smalling, Case manager, who will arrange home health for this patient.  Pt is agreeable to home health.  He does state he wants to go home. I have stressed with him the severity and serious nature of his condition.  Pt verbalizes understanding.    [EW]  2113 Discussed patient with Avie Echevaria, PA-C, who assumes care of patient at change of shift.  Dr Thomasene Lot also aware of patient and plan.    [EW]    Clinical Course User Index [EW] [place], Alexsis Kathman, [place]    Afebrile nontoxic patient with chronic social problem of living alone with memory problems.  Multiple family members have confirmed that this is a chronic issue that patient forgets and gives himself additional medication, also remembers the wrong dose (20 units instead of 5).  I have spoken with patient, patient's sister, patient's daughter, and have consulted social work and case management because of the safety concerns I have regarding this situation.  Pt has recently had his medication adjusted by [facility].  I doubt that this dose is causing the drop, but rather the known problem of patient accidentally overdosing himself.  I have stressed with the family the need to control the medication and have also discussed the need to consider a living situation with either additional people in the home to help or assisted living.  Sister is in agreement with this.  I have provided resources from social work to aid them in this discussion.  Case manager has arranged for home health - pt initially resistant to this idea but now in agreement.  Patient's blood sugar remained high after the initial treatment by EMS and only eating food in the ED, no additional glucose given.  Pt's mental state cleared and he was conversational, able to discuss his situation clearly.  He had an uneventful ED course.  Labs unremarkable.  Pt  signed out to oncoming shift at the end of my shift, anticipating return of family to provide safe dispo for home.    Final Clinical Impressions(s) / ED Diagnoses   Final diagnoses:  Hypoglycemia    New Prescriptions Discharge Medication List as of 04/27/2017 10:12 PM       [name] Bibles, Hershal Coria 04/28/17 1042    Long, Wonda Olds, MD 04/28/17 1456

## 2017-04-27 NOTE — Progress Notes (Addendum)
CSW spoke to EDP regarding pt's family's interests in assisting the pt with disposition at D/C.  At this time pt does not meet inpatient criteria, per EDP.    CSW noted in chart pt has Medicare A&B which requires 3 day inpatient stay to pay for SNF.  Per notes, pt was admitted from 6/10-6/19 and may have had 3 day stay within past 30 days so may have a payor for SNF if pt qualifies meaning either PCP recommends SNF and/or Medicare agrees pt is "skilleable".  Medicare may say pt is "custodial", rather than "skilleable" and it is advised that pt's daughter get advice of admissions at a SNF.    CSW informed EDP pt can go to PCP and request FL-2 and recommendation for SNF or if willing to private pay if 3 day stay doesn't qualify pt for SNF stay, pt's daughter can send pt to ALF or "Respite Care" at an assisted living facility, which is less expensive than regular stay at an ALF which requires either private pay or Medicaid.  In addition, pt's daughter may choose 24 hour care at home  CSW faxed EDP SNF and ALF lists in [place] to provide to pt's daughter.  9:13 PM CSW confirmed with another EDP pt has been seen by CM for needs in the home. Please reconsult if future social work needs arise.     Alphonse Guild. Camylle Whicker, Reed Pandy, CSI Clinical Social Worker Ph: [phone]

## 2017-04-27 NOTE — ED Notes (Signed)
Attempted IV without success.

## 2017-04-27 NOTE — ED Provider Notes (Signed)
Patient care transferred at end of shift from [facility], PA-C pending social work assistance for safe discharge plan.  Patient is ready to go home as long as family is here to take him. Patient's family arrived to take him home. Patient discharged.    Emeline General, PA-C 04/28/17 0536    Macarthur Critchley, MD 04/29/17 [phone]

## 2017-04-27 NOTE — Discharge Instructions (Signed)
Read the information below.  You may return to the Emergency Department at any time for worsening condition or any new symptoms that concern you.   It is very important that you are safe at home with your medications.  Please decide who will be giving the daily medications and keep the extra medication out of reach until it is due again.    The case manager is arranging home health services for you.  Please contact your primary care provider if you are interested in assisted living facilities or respite care.  The social worker has provided many resources for you.

## 2017-04-27 NOTE — ED Triage Notes (Signed)
Pt from home via EMS with hypoglycemia. Per EMS, pt's daughter found pt laying on the bed with a CBG of 22. Pt CBG 19 on EMS arrival. Pt given 1mg  glucagon en route, CBG improved to 101. Pt became more alert and oriented en route after glucagon administration. 126/69, 46 bpm sinus brady, 96% on RA. Pt alert. Denies pain.

## 2017-05-03 ENCOUNTER — Inpatient Hospital Stay (HOSPITAL_COMMUNITY)
Admission: EM | Admit: 2017-05-03 | Discharge: 2017-05-08 | DRG: 638 | Disposition: A | Discharge status: Home Health | Payer: Medicare Other | Attending: Internal Medicine | Admitting: Internal Medicine

## 2017-05-03 ENCOUNTER — Encounter (HOSPITAL_COMMUNITY): Payer: Self-pay | Admitting: Emergency Medicine

## 2017-05-03 DIAGNOSIS — Z79899 Other long term (current) drug therapy: Secondary | ICD-10-CM

## 2017-05-03 DIAGNOSIS — Z9114 Patient's other noncompliance with medication regimen: Secondary | ICD-10-CM

## 2017-05-03 DIAGNOSIS — E1165 Type 2 diabetes mellitus with hyperglycemia: Secondary | ICD-10-CM

## 2017-05-03 DIAGNOSIS — D649 Anemia, unspecified: Secondary | ICD-10-CM | POA: Diagnosis present

## 2017-05-03 DIAGNOSIS — IMO0002 Reserved for concepts with insufficient information to code with codable children: Secondary | ICD-10-CM

## 2017-05-03 DIAGNOSIS — Z794 Long term (current) use of insulin: Secondary | ICD-10-CM | POA: Diagnosis not present

## 2017-05-03 DIAGNOSIS — Z1611 Resistance to penicillins: Secondary | ICD-10-CM | POA: Diagnosis present

## 2017-05-03 DIAGNOSIS — Z7982 Long term (current) use of aspirin: Secondary | ICD-10-CM | POA: Diagnosis not present

## 2017-05-03 DIAGNOSIS — B961 Klebsiella pneumoniae [K. pneumoniae] as the cause of diseases classified elsewhere: Secondary | ICD-10-CM | POA: Diagnosis present

## 2017-05-03 DIAGNOSIS — Z8673 Personal history of transient ischemic attack (TIA), and cerebral infarction without residual deficits: Secondary | ICD-10-CM

## 2017-05-03 DIAGNOSIS — Z833 Family history of diabetes mellitus: Secondary | ICD-10-CM

## 2017-05-03 DIAGNOSIS — I10 Essential (primary) hypertension: Secondary | ICD-10-CM | POA: Diagnosis present

## 2017-05-03 DIAGNOSIS — E119 Type 2 diabetes mellitus without complications: Secondary | ICD-10-CM

## 2017-05-03 DIAGNOSIS — E1110 Type 2 diabetes mellitus with ketoacidosis without coma: Secondary | ICD-10-CM | POA: Diagnosis not present

## 2017-05-03 DIAGNOSIS — Z9119 Patient's noncompliance with other medical treatment and regimen: Secondary | ICD-10-CM | POA: Diagnosis not present

## 2017-05-03 DIAGNOSIS — N3000 Acute cystitis without hematuria: Secondary | ICD-10-CM | POA: Diagnosis not present

## 2017-05-03 DIAGNOSIS — Z66 Do not resuscitate: Secondary | ICD-10-CM | POA: Diagnosis present

## 2017-05-03 DIAGNOSIS — E1010 Type 1 diabetes mellitus with ketoacidosis without coma: Secondary | ICD-10-CM | POA: Diagnosis not present

## 2017-05-03 DIAGNOSIS — N179 Acute kidney failure, unspecified: Secondary | ICD-10-CM | POA: Diagnosis not present

## 2017-05-03 DIAGNOSIS — Z96641 Presence of right artificial hip joint: Secondary | ICD-10-CM | POA: Diagnosis present

## 2017-05-03 DIAGNOSIS — E860 Dehydration: Secondary | ICD-10-CM | POA: Diagnosis present

## 2017-05-03 DIAGNOSIS — N390 Urinary tract infection, site not specified: Secondary | ICD-10-CM | POA: Diagnosis present

## 2017-05-03 DIAGNOSIS — E875 Hyperkalemia: Secondary | ICD-10-CM | POA: Diagnosis not present

## 2017-05-03 DIAGNOSIS — E1121 Type 2 diabetes mellitus with diabetic nephropathy: Secondary | ICD-10-CM | POA: Diagnosis present

## 2017-05-03 LAB — BASIC METABOLIC PANEL
Anion gap: 24 — ABNORMAL HIGH (ref 5–15)
Anion gap: 15 (ref 5–15)
Anion gap: 14 (ref 5–15)
BUN: 81 mg/dL — ABNORMAL HIGH (ref 6–20)
BUN: 79 mg/dL — ABNORMAL HIGH (ref 6–20)
BUN: 74 mg/dL — ABNORMAL HIGH (ref 6–20)
BUN: 71 mg/dL — ABNORMAL HIGH (ref 6–20)
CO2: 7 mmol/L — ABNORMAL LOW (ref 22–32)
CO2: 8 mmol/L — ABNORMAL LOW (ref 22–32)
CO2: 16 mmol/L — ABNORMAL LOW (ref 22–32)
CO2: 18 mmol/L — ABNORMAL LOW (ref 22–32)
Calcium: 8.6 mg/dL — ABNORMAL LOW (ref 8.9–10.3)
Calcium: 8.5 mg/dL — ABNORMAL LOW (ref 8.9–10.3)
Calcium: 8.6 mg/dL — ABNORMAL LOW (ref 8.9–10.3)
Calcium: 8.3 mg/dL — ABNORMAL LOW (ref 8.9–10.3)
Chloride: 93 mmol/L — ABNORMAL LOW (ref 101–111)
Chloride: 103 mmol/L (ref 101–111)
Chloride: 108 mmol/L (ref 101–111)
Chloride: 107 mmol/L (ref 101–111)
Creatinine, Ser: 4.73 mg/dL — ABNORMAL HIGH (ref 0.61–1.24)
Creatinine, Ser: 4.66 mg/dL — ABNORMAL HIGH (ref 0.61–1.24)
Creatinine, Ser: 4.38 mg/dL — ABNORMAL HIGH (ref 0.61–1.24)
Creatinine, Ser: 3.79 mg/dL — ABNORMAL HIGH (ref 0.61–1.24)
GFR calc Af Amer: 13 mL/min — ABNORMAL LOW (ref >60)
GFR calc Af Amer: 13 mL/min — ABNORMAL LOW (ref >60)
GFR calc Af Amer: 14 mL/min — ABNORMAL LOW (ref >60)
GFR calc Af Amer: 17 mL/min — ABNORMAL LOW (ref >60)
GFR calc non Af Amer: 11 mL/min — ABNORMAL LOW (ref >60)
GFR calc non Af Amer: 11 mL/min — ABNORMAL LOW (ref >60)
GFR calc non Af Amer: 12 mL/min — ABNORMAL LOW (ref >60)
GFR calc non Af Amer: 15 mL/min — ABNORMAL LOW (ref >60)
Glucose, Bld: 928 mg/dL (ref 65–99)
Glucose, Bld: 490 mg/dL — ABNORMAL HIGH (ref 65–99)
Glucose, Bld: 156 mg/dL — ABNORMAL HIGH (ref 65–99)
Glucose, Bld: 168 mg/dL — ABNORMAL HIGH (ref 65–99)
Potassium: 5.7 mmol/L — ABNORMAL HIGH (ref 3.5–5.1)
Potassium: 4.1 mmol/L (ref 3.5–5.1)
Potassium: 4.5 mmol/L (ref 3.5–5.1)
Potassium: 4.4 mmol/L (ref 3.5–5.1)
Sodium: 128 mmol/L — ABNORMAL LOW (ref 135–145)
Sodium: 135 mmol/L (ref 135–145)
Sodium: 139 mmol/L (ref 135–145)
Sodium: 139 mmol/L (ref 135–145)

## 2017-05-03 LAB — GLUCOSE, CAPILLARY
Glucose-Capillary: 366 mg/dL — ABNORMAL HIGH (ref 65–99)
Glucose-Capillary: 270 mg/dL — ABNORMAL HIGH (ref 65–99)
Glucose-Capillary: 193 mg/dL — ABNORMAL HIGH (ref 65–99)
Glucose-Capillary: 154 mg/dL — ABNORMAL HIGH (ref 65–99)
Glucose-Capillary: 109 mg/dL — ABNORMAL HIGH (ref 65–99)
Glucose-Capillary: 106 mg/dL — ABNORMAL HIGH (ref 65–99)
Glucose-Capillary: 118 mg/dL — ABNORMAL HIGH (ref 65–99)
Glucose-Capillary: 157 mg/dL — ABNORMAL HIGH (ref 65–99)
Glucose-Capillary: 185 mg/dL — ABNORMAL HIGH (ref 65–99)
Glucose-Capillary: 196 mg/dL — ABNORMAL HIGH (ref 65–99)

## 2017-05-03 LAB — BLOOD GAS, VENOUS
O2 Saturation: 78.8 %
Patient temperature: 98.6
pH, Ven: 7.125 — CL (ref 7.250–7.430)
pO2, Ven: 53.2 mmHg — ABNORMAL HIGH (ref 32.0–45.0)

## 2017-05-03 LAB — CBC
HCT: 32.2 % — ABNORMAL LOW (ref 39.0–52.0)
HCT: 31.1 % — ABNORMAL LOW (ref 39.0–52.0)
Hemoglobin: 10.6 g/dL — ABNORMAL LOW (ref 13.0–17.0)
Hemoglobin: 10.7 g/dL — ABNORMAL LOW (ref 13.0–17.0)
MCH: 30.8 pg (ref 26.0–34.0)
MCH: 30.5 pg (ref 26.0–34.0)
MCHC: 32.9 g/dL (ref 30.0–36.0)
MCHC: 34.4 g/dL (ref 30.0–36.0)
MCV: 93.6 fL (ref 78.0–100.0)
MCV: 88.6 fL (ref 78.0–100.0)
Platelets: 264 10*3/uL (ref 150–400)
Platelets: 252 10*3/uL (ref 150–400)
RBC: 3.44 MIL/uL — ABNORMAL LOW (ref 4.22–5.81)
RBC: 3.51 MIL/uL — ABNORMAL LOW (ref 4.22–5.81)
RDW: 14 % (ref 11.5–15.5)
RDW: 13.5 % (ref 11.5–15.5)
WBC: 15.1 10*3/uL — ABNORMAL HIGH (ref 4.0–10.5)
WBC: 14.3 10*3/uL — ABNORMAL HIGH (ref 4.0–10.5)

## 2017-05-03 LAB — URINALYSIS, ROUTINE W REFLEX MICROSCOPIC
Bilirubin Urine: NEGATIVE
Glucose, UA: >=500 mg/dL — AB
Ketones, ur: 20 mg/dL — AB
Nitrite: NEGATIVE
Protein, ur: 100 mg/dL — AB
Specific Gravity, Urine: 1.013 (ref 1.005–1.030)
pH: 5 (ref 5.0–8.0)

## 2017-05-03 LAB — CBG MONITORING, ED
Glucose-Capillary: >600 mg/dL (ref 65–99)
Glucose-Capillary: >600 mg/dL (ref 65–99)
Glucose-Capillary: 537 mg/dL (ref 65–99)

## 2017-05-03 LAB — MRSA PCR SCREENING: MRSA by PCR: NEGATIVE

## 2017-05-03 LAB — I-STAT CG4 LACTIC ACID, ED: Lactic Acid, Venous: 2.18 mmol/L (ref 0.5–1.9)

## 2017-05-03 LAB — MAGNESIUM: Magnesium: 2.7 mg/dL — ABNORMAL HIGH (ref 1.7–2.4)

## 2017-05-03 LAB — BETA-HYDROXYBUTYRIC ACID: Beta-Hydroxybutyric Acid: 3.16 mmol/L — ABNORMAL HIGH (ref 0.05–0.27)

## 2017-05-03 LAB — PHOSPHORUS: Phosphorus: 6.2 mg/dL — ABNORMAL HIGH (ref 2.5–4.6)

## 2017-05-03 MED ORDER — CEFTRIAXONE SODIUM 1 G IJ SOLR
1.0000 g | Freq: Once | INTRAMUSCULAR | Status: AC
  Administered 2017-05-03: 1 g via INTRAVENOUS
  Filled 2017-05-03: qty 10

## 2017-05-03 MED ORDER — VITAMIN B-12 1000 MCG PO TABS
1000.0000 ug | ORAL_TABLET | Freq: Every day | ORAL | Status: DC
  Administered 2017-05-03 – 2017-05-08 (×6): 1000 ug via ORAL
  Filled 2017-05-03 (×6): qty 1

## 2017-05-03 MED ORDER — HEPARIN SODIUM (PORCINE) 5000 UNIT/ML IJ SOLN
5000.0000 [IU] | Freq: Three times a day (TID) | INTRAMUSCULAR | Status: DC
  Administered 2017-05-03 – 2017-05-08 (×15): 5000 [IU] via SUBCUTANEOUS
  Filled 2017-05-03 (×15): qty 1

## 2017-05-03 MED ORDER — SODIUM CHLORIDE 0.9 % IV BOLUS (SEPSIS)
1000.0000 mL | Freq: Once | INTRAVENOUS | Status: AC
  Administered 2017-05-03: 1000 mL via INTRAVENOUS

## 2017-05-03 MED ORDER — DEXTROSE-NACL 5-0.45 % IV SOLN
INTRAVENOUS | Status: DC
  Administered 2017-05-04 – 2017-05-05 (×2): via INTRAVENOUS

## 2017-05-03 MED ORDER — ASPIRIN EC 81 MG PO TBEC
81.0000 mg | DELAYED_RELEASE_TABLET | Freq: Every day | ORAL | Status: DC
  Administered 2017-05-03 – 2017-05-08 (×6): 81 mg via ORAL
  Filled 2017-05-03 (×6): qty 1

## 2017-05-03 MED ORDER — DEXTROSE 5 % IV SOLN
1.0000 g | INTRAVENOUS | Status: DC
  Filled 2017-05-03: qty 10

## 2017-05-03 MED ORDER — SODIUM CHLORIDE 0.9 % IV SOLN
INTRAVENOUS | Status: DC
  Administered 2017-05-03: 5.4 [IU]/h via INTRAVENOUS
  Filled 2017-05-03: qty 1

## 2017-05-03 MED ORDER — FLUOXETINE HCL 20 MG PO CAPS
40.0000 mg | ORAL_CAPSULE | Freq: Every day | ORAL | Status: DC
  Administered 2017-05-03 – 2017-05-08 (×6): 40 mg via ORAL
  Filled 2017-05-03 (×9): qty 2

## 2017-05-03 MED ORDER — ASPIRIN 81 MG PO TBEC: 81.0000 mg | DELAYED_RELEASE_TABLET | Freq: Every day | ORAL | Status: DC

## 2017-05-03 MED ORDER — SODIUM CHLORIDE 0.9 % IV SOLN
INTRAVENOUS | Status: DC
  Administered 2017-05-04: 2.3 [IU]/h via INTRAVENOUS
  Administered 2017-05-04: 3.3 [IU]/h via INTRAVENOUS
  Administered 2017-05-04: 3 [IU]/h via INTRAVENOUS
  Administered 2017-05-05: 2.7 [IU]/h via INTRAVENOUS
  Administered 2017-05-05: 0.5 [IU]/h via INTRAVENOUS
  Administered 2017-05-05: 2.3 [IU]/h via INTRAVENOUS
  Filled 2017-05-03 (×2): qty 1

## 2017-05-03 MED ORDER — DEXTROSE-NACL 5-0.45 % IV SOLN
INTRAVENOUS | Status: DC
  Administered 2017-05-03 – 2017-05-04 (×2): via INTRAVENOUS

## 2017-05-03 MED ORDER — SODIUM CHLORIDE 0.9 % IV SOLN
INTRAVENOUS | Status: DC
  Administered 2017-05-03: 11:00:00 via INTRAVENOUS

## 2017-05-03 MED ORDER — AMLODIPINE BESYLATE 10 MG PO TABS
10.0000 mg | ORAL_TABLET | Freq: Every day | ORAL | Status: DC
  Administered 2017-05-03 – 2017-05-08 (×6): 10 mg via ORAL
  Filled 2017-05-03 (×6): qty 1

## 2017-05-03 MED ORDER — CARVEDILOL 6.25 MG PO TABS
6.2500 mg | ORAL_TABLET | Freq: Two times a day (BID) | ORAL | Status: DC
  Administered 2017-05-03 – 2017-05-08 (×10): 6.25 mg via ORAL
  Filled 2017-05-03 (×10): qty 1

## 2017-05-03 MED ORDER — ONDANSETRON HCL 4 MG/2ML IJ SOLN: 4.0000 mg | Freq: Four times a day (QID) | INTRAMUSCULAR | Status: DC | PRN

## 2017-05-03 MED ORDER — SODIUM CHLORIDE 0.9 % IV SOLN: INTRAVENOUS | Status: DC

## 2017-05-03 NOTE — ED Triage Notes (Addendum)
Family reports pt's glucose meter has been reading "high" for the past 2 days. Pt ran out of insulin at home. Family gave the patient another family member's insulin (family unsure what type, but normally it goes in an insulin pump) yesterday, but none today. Pt appears weak in triage. Triage meter read "high."

## 2017-05-03 NOTE — Progress Notes (Signed)
Pharmacy Antibiotic Note  [name] [name] is a 71 y.o. male admitted on 05/03/2017 with hyperglycemia.  Pharmacy has been consulted for ceftriaxone dosing for UTI.  Plan:  Ceftriaxone 1g IV q24h  No further dosing adjustments needed, Pharmacy will sign off  Weight: 135 lb (61.2 kg)  Temp (24hrs), Avg:98.2 F (36.8 C), Min:97.7 F (36.5 C), Max:98.7 F (37.1 C)   Recent Labs Lab 04/27/17 1828 05/03/17 0847 05/03/17 0853  WBC 12.8* 15.1*  --   CREATININE 1.49* 4.73*  --   LATICACIDVEN  --   --  2.18*    Estimated Creatinine Clearance: 12.2 mL/min (A) (by C-G formula based on SCr of 4.73 mg/dL (H)).    No Known Allergies  Antimicrobials this admission: 7/22 Ceftriaxone  Dose adjustments this admission: none  Microbiology results: 7/22 UCx: sent  Thank you for allowing pharmacy to be a part of this patient's care.  Peggyann Juba, PharmD, BCPS Pager: [phone] 05/03/2017 10:34 AM

## 2017-05-03 NOTE — ED Provider Notes (Signed)
[facility] DEPT Provider Note   CSN: [id number] Arrival date & time: 05/03/17  0802     History   Chief Complaint Chief Complaint  Patient presents with  . Hyperglycemia    HPI [name] [name] is a 71 y.o. male.  HPI Patient presents to the emergency department with elevated blood sugars and weakness.  The daughter states that he has also had vomiting that started last night.  The patient states that he feels weak since yesterday.  The patient has been using his sister's insulin.  Patient had some insurance issues and trouble getting his medicationsThe patient denies chest pain, shortness of breath, headache,blurred vision, neck pain, fever, cough, numbness, dizziness, anorexia, edema, abdominal pain, nausea, diarrhea, rash, back pain, dysuria, hematemesis, bloody stool, near syncope, or syncope. Past Medical History:  Diagnosis Date  . 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 Closed comminuted intertrochanteric fracture of left femur ([place])   . Diabetes mellitus without complication ([place])   . Diabetic hyperosmolar non-ketotic state ([place]) 05/15/2016  . DKA (diabetic ketoacidoses) ([place]) 05/09/2016  . Hypertension   . Postoperative anemia due to acute blood loss 06/18/2016  . Retroperitoneal hematoma 06/18/2016  . Stroke ([place])   . Vitamin B 12 deficiency 06/18/2016    Patient Active Problem List   Diagnosis Date Noted  . DKA (diabetic ketoacidosis) ([place]) 03/22/2017  . Elbow pain, right 03/22/2017  . Abdominal pain due to injury 03/22/2017  . Rib pain on left side 01/29/2017  . Dehydration   . Urinary tract infection without hematuria   . Hyperkalemia 01/16/2017  . Acute metabolic encephalopathy 88/41/6606  . Elevated lactic acid level   . AKI (acute kidney injury) ([place]) 12/14/2016  . Hypoglycemia   . Sepsis ([place]) 12/13/2016  . Uncontrolled type 2 diabetes mellitus with complication ([place])   . Acute renal failure superimposed on  stage 3 chronic kidney disease ([place])   . Noncompliance with medication regimen 08/17/2016  . History of DVT (deep vein thrombosis) 07/06/2016  . Retroperitoneal hematoma 06/18/2016  . Vitamin B 12 deficiency 06/18/2016  . Hyperglycemia 05/15/2016  . Anxiety 05/15/2016  . History of CVA (cerebrovascular accident) 05/15/2016  . Hypoglycemia, coma ([place]) 11/02/2015  . Narcotic dependency, continuous ([place]) 11/02/2015  . Uncontrolled type 2 diabetes mellitus with diabetic nephropathy, with long-term current use of insulin ([place]) 08/12/2015  . Depression 08/12/2015  . CKD (chronic kidney disease) stage 3, GFR 30-59 ml/min 03/20/2014    Past Surgical History:  Procedure Laterality Date  . HIP ARTHROPLASTY Right 02/05/2013   Procedure: ARTHROPLASTY BIPOLAR HIP;  Surgeon: [name], MD;  Location: [facility];  Service: Orthopedics;  Laterality: Right;  . INTRAMEDULLARY (IM) NAIL INTERTROCHANTERIC Left 06/16/2016   Procedure: INTRAMEDULLARY (IM) NAIL INTERTROCHANTRIC;  Surgeon: Rod Can, MD;  Location: [facility];  Service: Orthopedics;  Laterality: Left;       Home Medications    Prior to Admission medications   Medication Sig Start Date End Date Taking? Authorizing Provider  amLODipine (NORVASC) 10 MG tablet Take 1 tablet (10 mg total) by mouth daily. 01/30/17  Yes [name], [name]  aspirin 81 MG EC tablet Take 1 tablet (81 mg total) by mouth daily. 01/29/17  Yes [name], [name]  blood glucose meter kit and supplies Dispense based on patient and insurance preference. Use up to four times daily as directed. (FOR ICD-9 250.00, 250.01). 01/20/17  Yes Barton Dubois, MD  carvedilol (COREG) 6.25 MG tablet Take  1 tablet (6.25 mg total) by mouth 2 (two) times daily with a meal. 01/30/17  Yes [name], [name]  FLUoxetine (PROZAC) 40 MG capsule Take 1 capsule (40 mg total) by mouth daily. 01/30/17  Yes [name], [name]  insulin aspart (NOVOLOG FLEXPEN) 100 UNIT/ML FlexPen  0-15 Units, Subcutaneous, 3 times daily with meals CBG < 70: implement hypoglycemia protocol CBG 70 - 120: 0 units CBG 121 - 150: 2 units CBG 151 - 200: 3 units CBG 201 - 250: 5 units CBG 251 - 300: 8 units CBG 301 - 350: 11 units CBG 351 - 400: 15 units CBG > 400: call MD 01/29/17  Yes [name], [name]  Insulin Detemir (LEVEMIR) 100 UNIT/ML Pen Inject 5 Units into the skin 2 (two) times daily. Patient taking differently: Inject 10 Units into the skin at bedtime.  03/31/17  Yes Florencia Reasons, MD  Insulin Pen Needle 31G X 5 MM MISC Use as needed to inject insulin as prescribed 01/20/17  Yes Barton Dubois, MD  vitamin B-12 (CYANOCOBALAMIN) 1000 MCG tablet Take 1 tablet (1,000 mcg total) by mouth daily. 01/20/17  Yes Barton Dubois, MD  acetaminophen (TYLENOL) 325 MG tablet Take 2 tablets (650 mg total) by mouth every 6 (six) hours as needed for mild pain (or Fever >/= 101). Patient not taking: Reported on 03/22/2017 01/20/17   Barton Dubois, MD  feeding supplement, GLUCERNA SHAKE, (GLUCERNA SHAKE) LIQD Take 237 mLs by mouth 2 (two) times daily between meals. Patient not taking: Reported on 04/27/2017 02/01/17   Mariel Aloe, MD  HYDROcodone-acetaminophen (NORCO/VICODIN) 5-325 MG tablet Take 1 tablet by mouth every 6 (six) hours as needed for moderate pain or severe pain. Patient not taking: Reported on 04/27/2017 02/01/17   Mariel Aloe, MD  insulin aspart (NOVOLOG FLEXPEN) 100 UNIT/ML FlexPen 3units with each meal Patient not taking: Reported on 05/03/2017 03/31/17   Florencia Reasons, MD    Family History Family History  Problem Relation Age of Onset  . Diabetes Mother   . Alzheimer's disease Mother   . Hypertension Mother   . Hyperlipidemia Father   . Hypertension Father   . Healthy Maternal Grandmother   . Pneumonia Maternal Grandfather     Social History Social History  Substance Use Topics  . Smoking status: Current Some Day Smoker    Packs/day: 0.25    Years: 30.00    Types:  Cigarettes    Last attempt to quit: 01/31/2013  . Smokeless tobacco: Never Used     Comment: "some days I light one", requests a nicotine patch  . Alcohol use No     Allergies   Patient has no known allergies.   Review of Systems Review of Systems All other systems negative except as documented in the HPI. All pertinent positives and negatives as reviewed in the HPI.  Physical Exam Updated Vital Signs BP (!) 107/44   Pulse 72   Temp 97.7 F (36.5 C) (Oral)   Resp 20   Wt 61.2 kg (135 lb)   SpO2 100%   BMI 19.94 kg/m   Physical Exam  Constitutional: He is oriented to person, place, and time. He appears well-developed and well-nourished. He appears lethargic. He is cooperative. He is easily aroused. He has a sickly appearance. No distress.  HENT:  Head: Normocephalic and atraumatic.  Mouth/Throat: Oropharynx is clear and moist.  Eyes: Pupils are equal, round, and reactive to light.  Neck: Normal range of motion. Neck supple.  Cardiovascular: Normal rate, regular rhythm and normal heart sounds.  Exam reveals no gallop and no friction rub.   No murmur heard. Pulmonary/Chest: Effort normal and breath sounds normal. No respiratory distress. He has no wheezes.  Abdominal: Soft. Bowel sounds are normal. He exhibits no distension. There is no tenderness.  Neurological: He is oriented to person, place, and time and easily aroused. He appears lethargic. He exhibits normal muscle tone. Coordination normal.  Skin: Skin is warm and dry. Capillary refill takes less than 2 seconds. No rash noted. No erythema.  Psychiatric: He has a normal mood and affect. His behavior is normal.  Nursing note and vitals reviewed.    ED Treatments / Results  Labs (all labs ordered are listed, but only abnormal results are displayed) Labs Reviewed  BASIC METABOLIC PANEL - Abnormal; Notable for the following:       Result Value   Sodium 128 (*)    Potassium 5.7 (*)    Chloride 93 (*)    CO2 7 (*)      Glucose, Bld 928 (*)    BUN 81 (*)    Creatinine, Ser 4.73 (*)    Calcium 8.6 (*)    GFR calc non Af Amer 11 (*)    GFR calc Af Amer 13 (*)    All other components within normal limits  CBC - Abnormal; Notable for the following:    WBC 15.1 (*)    RBC 3.44 (*)    Hemoglobin 10.6 (*)    HCT 32.2 (*)    All other components within normal limits  URINALYSIS, ROUTINE W REFLEX MICROSCOPIC - Abnormal; Notable for the following:    APPearance HAZY (*)    Glucose, UA >=500 (*)    Hgb urine dipstick MODERATE (*)    Ketones, ur 20 (*)    Protein, ur 100 (*)    Leukocytes, UA LARGE (*)    Bacteria, UA MANY (*)    Squamous Epithelial / LPF 0-5 (*)    All other components within normal limits  BLOOD GAS, VENOUS - Abnormal; Notable for the following:    pH, Ven 7.125 (*)    pO2, Ven 53.2 (*)    All other components within normal limits  CBG MONITORING, ED - Abnormal; Notable for the following:    Glucose-Capillary >600 (*)    All other components within normal limits  I-STAT CG4 LACTIC ACID, ED - Abnormal; Notable for the following:    Lactic Acid, Venous 2.18 (*)    All other components within normal limits    EKG  EKG Interpretation None       Radiology No results found.  Procedures Procedures (including critical care time)  Medications Ordered in ED Medications  dextrose 5 %-0.45 % sodium chloride infusion (not administered)  insulin regular (NOVOLIN R,HUMULIN R) 100 Units in sodium chloride 0.9 % 100 mL (1 Units/mL) infusion (5.4 Units/hr Intravenous New Bag/Given 05/03/17 0917)  sodium chloride 0.9 % bolus 1,000 mL (1,000 mLs Intravenous New Bag/Given 05/03/17 0854)    And  0.9 %  sodium chloride infusion (not administered)     Initial Impression / Assessment and Plan / ED Course  I have reviewed the triage vital signs and the nursing notes.  Pertinent labs & imaging results that were available during my care of the patient were reviewed by me and considered in my  medical decision making (see chart for details).     Patient is in DKA and will need  admission to the hospitalist and started on the glucose stabilizer.   CRITICAL CARE Performed by: Brent General Total critical care time: 45 minutes Critical care time was exclusive of separately billable procedures and treating other patients. Critical care was necessary to treat or prevent imminent or life-threatening deterioration. Critical care was time spent personally by me on the following activities: development of treatment plan with patient and/or surrogate as well as nursing, discussions with consultants, evaluation of patient's response to treatment, examination of patient, obtaining history from patient or surrogate, ordering and performing treatments and interventions, ordering and review of laboratory studies, ordering and review of radiographic studies, pulse oximetry and re-evaluation of patient's condition.   Final Clinical Impressions(s) / ED Diagnoses   Final diagnoses:  None    New Prescriptions New Prescriptions   No medications on file     Rebeca Allegra 05/03/17 1008    Tanna Furry, MD 05/06/17 2332

## 2017-05-03 NOTE — H&P (Addendum)
History and Physical    [name] [name] XAJ:[id number] DOB: 13-May-1946 DOA: 05/03/2017  Referring MD/NP/PA:  PCP: [name], [name]  Outpatient Specialists:  Patient coming from: Home  Chief Complaint: Hyperglycemia  HPI: [name] [name] is a 71 y.o. male with medical history significant for but not limited to diverticulitis with previous DKA and hyperosmolar states presenting with generalized weakness and polyuria for several days associated with region of "high" on his glucometer. He has not been able to take his insulin due to insurance problems. Chest pain, fever or chills.  ED Course: At the ED patient was hemodynamically stable but lethargic with close region of more than 100 mg/dL with acidosis and anion gap. He was started on IV fluid bolus with insulin drip and is admitted for DKA.  Review of Systems:  As noted above otherwise unremarkable on 12 point systemic inquiry  Past Medical History:  Diagnosis Date  . 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 Closed comminuted intertrochanteric fracture of left femur ([place])   . Diabetes mellitus without complication ([place])   . Diabetic hyperosmolar non-ketotic state ([place]) 05/15/2016  . DKA (diabetic ketoacidoses) ([place]) 05/09/2016  . Hypertension   . Postoperative anemia due to acute blood loss 06/18/2016  . Retroperitoneal hematoma 06/18/2016  . Stroke ([place])   . Vitamin B 12 deficiency 06/18/2016    Past Surgical History:  Procedure Laterality Date  . HIP ARTHROPLASTY Right 02/05/2013   Procedure: ARTHROPLASTY BIPOLAR HIP;  Surgeon: [name], MD;  Location: [facility];  Service: Orthopedics;  Laterality: Right;  . INTRAMEDULLARY (IM) NAIL INTERTROCHANTERIC Left 06/16/2016   Procedure: INTRAMEDULLARY (IM) NAIL INTERTROCHANTRIC;  Surgeon: Rod Can, MD;  Location: [facility];  Service: Orthopedics;  Laterality: Left;     reports that he has been smoking Cigarettes.  He has a 7.50 pack-year  smoking history. He has never used smokeless tobacco. He reports that he does not drink alcohol or use drugs.  No Known Allergies  Family History  Problem Relation Age of Onset  . Diabetes Mother   . Alzheimer's disease Mother   . Hypertension Mother   . Hyperlipidemia Father   . Hypertension Father   . Healthy Maternal Grandmother   . Pneumonia Maternal Grandfather      Prior to Admission medications   Medication Sig Start Date End Date Taking? Authorizing Provider  amLODipine (NORVASC) 10 MG tablet Take 1 tablet (10 mg total) by mouth daily. 01/30/17  Yes [name], [name]  aspirin 81 MG EC tablet Take 1 tablet (81 mg total) by mouth daily. 01/29/17  Yes [name], [name]  blood glucose meter kit and supplies Dispense based on patient and insurance preference. Use up to four times daily as directed. (FOR ICD-9 250.00, 250.01). 01/20/17  Yes Barton Dubois, MD  carvedilol (COREG) 6.25 MG tablet Take 1 tablet (6.25 mg total) by mouth 2 (two) times daily with a meal. 01/30/17  Yes [name], [name]  FLUoxetine (PROZAC) 40 MG capsule Take 1 capsule (40 mg total) by mouth daily. 01/30/17  Yes [name], [name]  insulin aspart (NOVOLOG FLEXPEN) 100 UNIT/ML FlexPen 0-15 Units, Subcutaneous, 3 times daily with meals CBG < 70: implement hypoglycemia protocol CBG 70 - 120: 0 units CBG 121 - 150: 2 units CBG 151 - 200: 3 units CBG 201 - 250: 5 units CBG 251 - 300: 8 units CBG 301 - 350: 11 units CBG 351 - 400: 15 units  CBG > 400: call MD 01/29/17  Yes [name], [name]  Insulin Detemir (LEVEMIR) 100 UNIT/ML Pen Inject 5 Units into the skin 2 (two) times daily. Patient taking differently: Inject 10 Units into the skin at bedtime.  03/31/17  Yes Florencia Reasons, MD  Insulin Pen Needle 31G X 5 MM MISC Use as needed to inject insulin as prescribed 01/20/17  Yes Barton Dubois, MD  vitamin B-12 (CYANOCOBALAMIN) 1000 MCG tablet Take 1 tablet (1,000 mcg total) by mouth daily.  01/20/17  Yes Barton Dubois, MD  acetaminophen (TYLENOL) 325 MG tablet Take 2 tablets (650 mg total) by mouth every 6 (six) hours as needed for mild pain (or Fever >/= 101). Patient not taking: Reported on 03/22/2017 01/20/17   Barton Dubois, MD  feeding supplement, GLUCERNA SHAKE, (GLUCERNA SHAKE) LIQD Take 237 mLs by mouth 2 (two) times daily between meals. Patient not taking: Reported on 04/27/2017 02/01/17   Mariel Aloe, MD  HYDROcodone-acetaminophen (NORCO/VICODIN) 5-325 MG tablet Take 1 tablet by mouth every 6 (six) hours as needed for moderate pain or severe pain. Patient not taking: Reported on 04/27/2017 02/01/17   Mariel Aloe, MD  insulin aspart (NOVOLOG FLEXPEN) 100 UNIT/ML FlexPen 3units with each meal Patient not taking: Reported on 05/03/2017 03/31/17   Florencia Reasons, MD    Physical Exam: Vitals:   05/03/17 0737 05/03/17 0818 05/03/17 0854 05/03/17 0955  BP: (!) 107/44     Pulse: 72     Resp: 20     Temp: 97.7 F (36.5 C)   98.7 F (37.1 C)  TempSrc: Oral   Rectal  SpO2: 100%     Weight:  61.2 kg (135 lb) 61.2 kg (135 lb)       Constitutional: NAD, calm, comfortable Vitals:   05/03/17 0817 05/03/17 0818 05/03/17 0854 05/03/17 0955  BP: (!) 107/44     Pulse: 72     Resp: 20     Temp: 97.7 F (36.5 C)   98.7 F (37.1 C)  TempSrc: Oral   Rectal  SpO2: 100%     Weight:  61.2 kg (135 lb) 61.2 kg (135 lb)    Eyes: PERRL, lids and conjunctivae normal ENMT: Dry mucous membranes. Posterior pharynx clear of any exudate or lesions.Normal dentition.  Neck: normal, supple, no masses, no thyromegaly Respiratory: clear to auscultation bilaterally, no wheezing, no crackles. Normal respiratory effort. No accessory muscle use.  Cardiovascular: Regular rate and rhythm, no murmurs / rubs / gallops. No extremity edema. 2+ pedal pulses. No carotid bruits.  Abdomen: no tenderness, no masses palpated. No hepatosplenomegaly. Bowel sounds positive.  Musculoskeletal: no clubbing /  cyanosis. No joint deformity upper and lower extremities. Good ROM, no contractures. Normal muscle tone.  Skin: no rashes, lesions, ulcers. No induration Neurologic: Mildly lethargic but arousable and obeys simple commands. CN 2-12 grossly intact. Sensation intact, DTR normal. Strength 5/5 in all 4.      Labs on Admission: I have personally reviewed following labs and imaging studies  CBC:  Recent Labs Lab 04/27/17 1828 05/03/17 0847  WBC 12.8* 15.1*  NEUTROABS 10.6*  --   HGB 10.3* 10.6*  HCT 31.5* 32.2*  MCV 89.5 93.6  PLT 293 106   Basic Metabolic Panel:  Recent Labs Lab 04/27/17 1828 05/03/17 0847  NA 132* 128*  K 4.8 5.7*  CL 101 93*  CO2 21* 7*  GLUCOSE 373* 928*  BUN 41* 81*  CREATININE 1.49* 4.73*  CALCIUM 8.8* 8.6*   GFR:  Estimated Creatinine Clearance: 12.2 mL/min (A) (by C-G formula based on SCr of 4.73 mg/dL (H)). Liver Function Tests: No results for input(s): AST, ALT, ALKPHOS, BILITOT, PROT, ALBUMIN in the last 168 hours. No results for input(s): LIPASE, AMYLASE in the last 168 hours. No results for input(s): AMMONIA in the last 168 hours. Coagulation Profile: No results for input(s): INR, PROTIME in the last 168 hours. Cardiac Enzymes: No results for input(s): CKTOTAL, CKMB, CKMBINDEX, TROPONINI in the last 168 hours. BNP (last 3 results) No results for input(s): PROBNP in the last 8760 hours. HbA1C: No results for input(s): HGBA1C in the last 72 hours. CBG:  Recent Labs Lab 04/27/17 1810 04/27/17 1946 04/27/17 2149 05/03/17 0818 05/03/17 1030  GLUCAP 330* 203* 199* >600* >600*   Lipid Profile: No results for input(s): CHOL, HDL, LDLCALC, TRIG, CHOLHDL, LDLDIRECT in the last 72 hours. Thyroid Function Tests: No results for input(s): TSH, T4TOTAL, FREET4, T3FREE, THYROIDAB in the last 72 hours. Anemia Panel: No results for input(s): VITAMINB12, FOLATE, FERRITIN, TIBC, IRON, RETICCTPCT in the last 72 hours. Urine analysis:      Component Value Date/Time   COLORURINE YELLOW 05/03/2017 0819   APPEARANCEUR HAZY (A) 05/03/2017 0819   LABSPEC 1.013 05/03/2017 0819   PHURINE 5.0 05/03/2017 0819   GLUCOSEU >=500 (A) 05/03/2017 0819   HGBUR MODERATE (A) 05/03/2017 0819   BILIRUBINUR NEGATIVE 05/03/2017 0819   KETONESUR 20 (A) 05/03/2017 0819   PROTEINUR 100 (A) 05/03/2017 0819   UROBILINOGEN 0.2 01/29/2017 1041   NITRITE NEGATIVE 05/03/2017 0819   LEUKOCYTESUR LARGE (A) 05/03/2017 0819   Sepsis Labs: _0 (procalcitonin:4,lacticidven:4) )No results found for this or any previous visit (from the past 240 hour(s)).   Radiological Exams on Admission: No results found.  EKG: Independently reviewed.   Assessment/Plan Active Problems:   Uncontrolled type 2 diabetes mellitus with diabetic nephropathy, with long-term current use of insulin ([name])   Dehydration   Urinary tract infection without hematuria   DKA (diabetic ketoacidoses) ([name])   #1 Diabetic ketoacidosis: insulin noncompliance Nothing by mouth for now IV fluids with monitoring of renal function and electrolytes as well as I/O's IV insulin Switch to subcutaneous insulin with basal coverage as needed when gap is closed and acidosis resolved CM/SW  - assistance with home insulin on discharge  #2 Acute Kidney Injury: Baseline creatinine about 1.2 Due to dehydration with poor intake IV rehydration Monitor renal function and electrolytes  #3 Dehydration: Due to that was #1 with polyuria\ IV fluids, supportive care  #4 UTI: Urine culture IV antibiotic  #5 Normocytic Anemia: No evidence of ongoing acute blood loss Stable Outpatient follow-up  DVT prophylaxis: (/Heparin) Code Status: (DNR) Family Communication: Daughter, Sonya, at bedside  Disposition Plan: To be dtermined Consults called:  Admission status: (inpatient / SDU)   OSEI-BONSU,Sarthak Rubenstein MD [facility] Pager [phone]  If 7PM-7AM, please contact  night-coverage [email or web address] Password [facility]  05/03/2017, 10:47 AM

## 2017-05-03 NOTE — ED Notes (Signed)
Hospitalist in room with patient. Will do EKG when DR exits.

## 2017-05-04 LAB — BASIC METABOLIC PANEL
Anion gap: 13 (ref 5–15)
Anion gap: 12 (ref 5–15)
Anion gap: 10 (ref 5–15)
Anion gap: 9 (ref 5–15)
BUN: 66 mg/dL — ABNORMAL HIGH (ref 6–20)
BUN: 65 mg/dL — ABNORMAL HIGH (ref 6–20)
BUN: 62 mg/dL — ABNORMAL HIGH (ref 6–20)
BUN: 55 mg/dL — ABNORMAL HIGH (ref 6–20)
CO2: 17 mmol/L — ABNORMAL LOW (ref 22–32)
CO2: 14 mmol/L — ABNORMAL LOW (ref 22–32)
CO2: 19 mmol/L — ABNORMAL LOW (ref 22–32)
CO2: 16 mmol/L — ABNORMAL LOW (ref 22–32)
Calcium: 8.3 mg/dL — ABNORMAL LOW (ref 8.9–10.3)
Calcium: 8 mg/dL — ABNORMAL LOW (ref 8.9–10.3)
Calcium: 8.2 mg/dL — ABNORMAL LOW (ref 8.9–10.3)
Calcium: 7.6 mg/dL — ABNORMAL LOW (ref 8.9–10.3)
Chloride: 109 mmol/L (ref 101–111)
Chloride: 109 mmol/L (ref 101–111)
Chloride: 108 mmol/L (ref 101–111)
Chloride: 111 mmol/L (ref 101–111)
Creatinine, Ser: 3.71 mg/dL — ABNORMAL HIGH (ref 0.61–1.24)
Creatinine, Ser: 3.4 mg/dL — ABNORMAL HIGH (ref 0.61–1.24)
Creatinine, Ser: 3.13 mg/dL — ABNORMAL HIGH (ref 0.61–1.24)
Creatinine, Ser: 2.88 mg/dL — ABNORMAL HIGH (ref 0.61–1.24)
GFR calc Af Amer: 17 mL/min — ABNORMAL LOW (ref >60)
GFR calc Af Amer: 19 mL/min — ABNORMAL LOW (ref >60)
GFR calc Af Amer: 21 mL/min — ABNORMAL LOW (ref >60)
GFR calc Af Amer: 24 mL/min — ABNORMAL LOW (ref >60)
GFR calc non Af Amer: 15 mL/min — ABNORMAL LOW (ref >60)
GFR calc non Af Amer: 17 mL/min — ABNORMAL LOW (ref >60)
GFR calc non Af Amer: 18 mL/min — ABNORMAL LOW (ref >60)
GFR calc non Af Amer: 20 mL/min — ABNORMAL LOW (ref >60)
Glucose, Bld: 163 mg/dL — ABNORMAL HIGH (ref 65–99)
Glucose, Bld: 327 mg/dL — ABNORMAL HIGH (ref 65–99)
Glucose, Bld: 131 mg/dL — ABNORMAL HIGH (ref 65–99)
Glucose, Bld: 244 mg/dL — ABNORMAL HIGH (ref 65–99)
Potassium: 4 mmol/L (ref 3.5–5.1)
Potassium: 4.4 mmol/L (ref 3.5–5.1)
Potassium: 3.5 mmol/L (ref 3.5–5.1)
Potassium: 4.2 mmol/L (ref 3.5–5.1)
Sodium: 139 mmol/L (ref 135–145)
Sodium: 135 mmol/L (ref 135–145)
Sodium: 137 mmol/L (ref 135–145)
Sodium: 136 mmol/L (ref 135–145)

## 2017-05-04 LAB — GLUCOSE, CAPILLARY
Glucose-Capillary: 138 mg/dL — ABNORMAL HIGH (ref 65–99)
Glucose-Capillary: 172 mg/dL — ABNORMAL HIGH (ref 65–99)
Glucose-Capillary: 133 mg/dL — ABNORMAL HIGH (ref 65–99)
Glucose-Capillary: 284 mg/dL — ABNORMAL HIGH (ref 65–99)
Glucose-Capillary: 325 mg/dL — ABNORMAL HIGH (ref 65–99)
Glucose-Capillary: 192 mg/dL — ABNORMAL HIGH (ref 65–99)
Glucose-Capillary: 227 mg/dL — ABNORMAL HIGH (ref 65–99)
Glucose-Capillary: 138 mg/dL — ABNORMAL HIGH (ref 65–99)
Glucose-Capillary: 119 mg/dL — ABNORMAL HIGH (ref 65–99)
Glucose-Capillary: 154 mg/dL — ABNORMAL HIGH (ref 65–99)
Glucose-Capillary: 137 mg/dL — ABNORMAL HIGH (ref 65–99)
Glucose-Capillary: 175 mg/dL — ABNORMAL HIGH (ref 65–99)
Glucose-Capillary: 215 mg/dL — ABNORMAL HIGH (ref 65–99)
Glucose-Capillary: 221 mg/dL — ABNORMAL HIGH (ref 65–99)
Glucose-Capillary: 173 mg/dL — ABNORMAL HIGH (ref 65–99)
Glucose-Capillary: 127 mg/dL — ABNORMAL HIGH (ref 65–99)
Glucose-Capillary: 152 mg/dL — ABNORMAL HIGH (ref 65–99)
Glucose-Capillary: 162 mg/dL — ABNORMAL HIGH (ref 65–99)
Glucose-Capillary: 134 mg/dL — ABNORMAL HIGH (ref 65–99)
Glucose-Capillary: 137 mg/dL — ABNORMAL HIGH (ref 65–99)
Glucose-Capillary: 135 mg/dL — ABNORMAL HIGH (ref 65–99)

## 2017-05-04 LAB — HEMOGLOBIN A1C
Hgb A1c MFr Bld: 11.6 % — ABNORMAL HIGH (ref 4.8–5.6)
Mean Plasma Glucose: 286 mg/dL

## 2017-05-04 MED ORDER — INSULIN DETEMIR 100 UNIT/ML ~~LOC~~ SOLN
5.0000 [IU] | Freq: Every day | SUBCUTANEOUS | Status: DC
  Administered 2017-05-04 – 2017-05-05 (×2): 5 [IU] via SUBCUTANEOUS
  Filled 2017-05-04 (×2): qty 0.05

## 2017-05-04 MED ORDER — SODIUM CHLORIDE 0.9 % IV BOLUS (SEPSIS)
500.0000 mL | Freq: Once | INTRAVENOUS | Status: AC
  Administered 2017-05-04: 500 mL via INTRAVENOUS

## 2017-05-04 MED ORDER — SODIUM CHLORIDE 0.9 % IV BOLUS (SEPSIS)
1000.0000 mL | Freq: Once | INTRAVENOUS | Status: AC
  Administered 2017-05-04: 1000 mL via INTRAVENOUS

## 2017-05-04 MED ORDER — HYDROCODONE-ACETAMINOPHEN 5-325 MG PO TABS
1.0000 | ORAL_TABLET | Freq: Once | ORAL | Status: AC
  Administered 2017-05-04: 1 via ORAL
  Filled 2017-05-04: qty 1

## 2017-05-04 NOTE — Care Management Note (Signed)
Case Management Note  Patient Details  Name: [name] [name] MRN: [id number] Date of Birth: 1946-04-04  Subjective/Objective:     dka and aki               Action/Plan: Date:  May 04, 2017 Chart reviewed for concurrent status and case management needs. Will continue to follow patient progress. Discharge Planning: following for needs Expected discharge date: [id number] [name], BSN, [place], [place]  Expected Discharge Date:  05/07/17               Expected Discharge Plan:  [place]  In-House Referral:     Discharge planning Services  CM Consult  Post Acute Care Choice:    Choice offered to:     DME Arranged:    DME Agency:     HH Arranged:    HH Agency:     Status of Service:  In process, will continue to follow  If discussed at Long Length of Stay Meetings, dates discussed:    Additional Comments:  Leeroy Cha, RN 05/04/2017, 8:58 AM

## 2017-05-04 NOTE — Progress Notes (Addendum)
[facility] PROGRESS NOTE    Progress Note  [name] [name]  AQT:[id number] DOB: 12-Jan-1946 DOA: 05/03/2017 PCP: [name], [name]     Brief Narrative:   [name] [name] is an 71 y.o. male past medical history diverticulitis previous DKA comes in with polyuria and polydipsia. He has not been getting his insulin regularly due to insurance.  Assessment/Plan:   DKA (diabetic ketoacidoses) ([name])/Uncontrolled type 2 diabetes mellitus with diabetic nephropathy, with long-term current use of insulin ([place])  He continues to be in DKA, will continue IV insulin, anion gap has closed bicarbonate is still 14, Normally his bicarbonate was greater than 20. We'll continue to monitor and replete potassium as needed. Give Levemir, cont. IV insulin until HCO > 20.  AKI: Baseline creatinine is less than 1, admission 3.4. Likely prerenal in etiology we'll give him a bolus of normal saline continue half normal saline with D5.  Urinary tract infection without hematuria Unlikely nitrates negative, afebrile leukocytosis possibly due to DKA. He denies any urinary symptoms. Discontinued IV Antibiotics await urine cultures.  Normocytic anemia. Follow-up with PCP as an outpatient.   DVT prophylaxis: lovenox Family Communication:none Disposition Plan/Barrier to D/C: home in am Code Status:     Code Status Orders        Start     Ordered   05/03/17 1233  Full code  Continuous     05/03/17 1232    Code Status History    Date Active Date Inactive Code Status Order ID Comments User Context   05/03/2017 10:45 AM 05/03/2017 12:32 PM DNR [id number]  Benito Mccreedy, MD ED   03/22/2017 11:51 PM 03/31/2017  3:46 PM DNR [id number]  Karmen Bongo, MD Inpatient   01/30/2017  3:02 AM 02/01/2017  7:11 PM Full Code [id number]  Gardiner Barefoot, NP Inpatient   01/29/2017 10:22 PM 01/30/2017  3:02 AM Full Code [id number]  Karmen Bongo, MD Inpatient   01/16/2017 10:09 PM 01/20/2017  6:43 PM Full Code [id number]   Toy Baker, MD Inpatient   12/28/2016  5:12 PM 12/30/2016  7:24 PM Full Code [id number]  Hosie Poisson, MD ED   12/14/2016 12:38 AM 12/17/2016  9:10 PM Full Code [id number]  Vianne Bulls, MD ED   11/16/2016 11:17 AM 11/20/2016 10:30 PM Full Code [id number]  Radene Gunning, NP ED   10/01/2016  6:28 PM 10/07/2016  7:53 PM Full Code [id number]  Omar Person, NP ED   08/17/2016  2:38 PM 08/19/2016 10:16 PM Full Code [id number]  Maren Reamer, MD ED   07/06/2016  1:30 AM 07/07/2016 10:43 PM Full Code [id number]  Lily Kocher, MD Inpatient   06/15/2016 10:07 AM 06/21/2016  7:21 PM Full Code [id number]  Tawni Millers, MD Inpatient   05/15/2016  9:35 PM 05/17/2016  4:53 PM Full Code [id number]  Vianne Bulls, MD ED   05/09/2016  2:52 AM 05/12/2016  5:16 PM Full Code [id number]  Roney Jaffe, MD Inpatient   11/01/2015  6:37 PM 11/03/2015  5:08 PM Full Code [id number]  Robbie Lis, MD Inpatient   09/26/2015 12:12 AM 09/27/2015  3:42 PM Full Code [id number]  Etta Quill, DO ED   08/21/2015  5:25 PM 08/24/2015  9:20 PM Full Code [id number]  Domenic Polite, MD Inpatient   08/12/2015  4:37 PM 08/14/2015  2:45 PM Full Code [id number]  Robbie Lis, MD ED   08/08/2015 12:37 AM 08/10/2015  6:30 PM Full Code [id number]  Allyne Gee, MD ED  07/05/2015  9:21 AM 07/09/2015  4:35 PM Full Code [id number]  Chesley Mires, MD ED   07/02/2015  6:36 PM 07/04/2015 10:36 PM Full Code [id number]  Marshell Garfinkel, MD ED   03/20/2015 11:06 AM 03/23/2015  3:50 PM Full Code [id number]  Allie Bossier, MD ED   03/20/2015 12:32 AM 03/20/2015 11:06 AM Full Code [id number]  Lavina Hamman, MD ED   03/19/2014  9:57 PM 03/22/2014 12:32 PM Full Code [id number]  Rise Patience, MD Inpatient   01/21/2014  3:06 PM 01/25/2014 12:12 PM Full Code [id number]  Eugenie Filler, MD ED   07/03/2013  8:50 PM 07/05/2013  4:09 PM Full Code [id number]  Theodis Blaze, MD ED   05/29/2013 10:49 PM 05/31/2013  4:02 PM Full Code [id number]  Merton Border, MD  Inpatient   02/05/2013  7:19 PM 02/09/2013  5:40 PM Full Code [id number]  [name], MD Inpatient   02/02/2013  8:57 PM 02/05/2013  7:19 PM Full Code [id number]  Erline Hau, MD ED    Advance Directive Documentation     Most Recent Value  Type of Advance Directive  Healthcare Power of Attorney  Pre-existing out of facility DNR order (yellow form or pink MOST form)  -  "MOST" Form in Place?  -        IV Access:    Peripheral IV   Procedures and diagnostic studies:   No results found.   Medical Consultants:    None.  Anti-Infectives:   Rocephin  Subjective:    [name] [name] urinate he is thirsty E he relates her sores.  Objective:    Vitals:   05/04/17 0400 05/04/17 0500 05/04/17 0600 05/04/17 0700  BP: (!) 106/44 (!) 92/50 (!) 107/54 (!) 97/54  Pulse: (!) 55 (!) 54 (!) 55 (!) 53  Resp: 19 18 19 19   Temp:      TempSrc:      SpO2: 95% 96% 97% 99%  Weight:      Height:        Intake/Output Summary (Last 24 hours) at 05/04/17 0713 Last data filed at 05/04/17 0600  Gross per 24 hour  Intake          3489.75 ml  Output              390 ml  Net          3099.75 ml   Filed Weights   05/03/17 0818 05/03/17 0854 05/04/17 0315  Weight: 61.2 kg (135 lb) 61.2 kg (135 lb) 64.9 kg (143 lb 1.3 oz)    Exam: General exam: In no acute distress. Respiratory system: Good air movement and clear to auscultation. Cardiovascular system: Regular rate and rhythm with positive S1-S2 no murmurs, no JVD. Gastrointestinal system: Abdomen is soft, nontender nondistended Central nervous system: Awake alert and oriented 3 nonfocal. Extremities: No lower extremity edema. Skin: No rashes. Psychiatry: Judgment and insight appeared normal.   Data Reviewed:    Labs: Basic Metabolic Panel:  Recent Labs Lab 05/03/17 1247 05/03/17 1628 05/03/17 2110 05/04/17 0037 05/04/17 0335  NA 135 139 139 139 135  K 4.1 4.5 4.4 4.0 4.4  CL 103 108 107 109 109  CO2  8* 16* 18* 17* 14*  GLUCOSE 490* 156* 168* 163* 327*  BUN 79* 74* 71* 66* 65*  CREATININE 4.66* 4.38* 3.79* 3.71* 3.40*  CALCIUM 8.5* 8.6* 8.3* 8.3* 8.0*  MG 2.7*  --   --   --   --  PHOS 6.2*  --   --   --   --    GFR Estimated Creatinine Clearance: 18 mL/min (A) (by C-G formula based on SCr of 3.4 mg/dL (H)). Liver Function Tests: No results for input(s): AST, ALT, ALKPHOS, BILITOT, PROT, ALBUMIN in the last 168 hours. No results for input(s): LIPASE, AMYLASE in the last 168 hours. No results for input(s): AMMONIA in the last 168 hours. Coagulation profile No results for input(s): INR, PROTIME in the last 168 hours.  CBC:  Recent Labs Lab 04/27/17 1828 05/03/17 0847 05/03/17 1247  WBC 12.8* 15.1* 14.3*  NEUTROABS 10.6*  --   --   HGB 10.3* 10.6* 10.7*  HCT 31.5* 32.2* 31.1*  MCV 89.5 93.6 88.6  PLT 293 264 252   Cardiac Enzymes: No results for input(s): CKTOTAL, CKMB, CKMBINDEX, TROPONINI in the last 168 hours. BNP (last 3 results) No results for input(s): PROBNP in the last 8760 hours. CBG:  Recent Labs Lab 05/04/17 0214 05/04/17 0331 05/04/17 0425 05/04/17 0529 05/04/17 0609  GLUCAP 133* 284* 325* 227* 192*   D-Dimer: No results for input(s): DDIMER in the last 72 hours. Hgb A1c: No results for input(s): HGBA1C in the last 72 hours. Lipid Profile: No results for input(s): CHOL, HDL, LDLCALC, TRIG, CHOLHDL, LDLDIRECT in the last 72 hours. Thyroid function studies: No results for input(s): TSH, T4TOTAL, T3FREE, THYROIDAB in the last 72 hours.  Invalid input(s): FREET3 Anemia work up: No results for input(s): VITAMINB12, FOLATE, FERRITIN, TIBC, IRON, RETICCTPCT in the last 72 hours. Sepsis Labs:  Recent Labs Lab 04/27/17 1828 05/03/17 0847 05/03/17 0853 05/03/17 1247  WBC 12.8* 15.1*  --  14.3*  LATICACIDVEN  --   --  2.18*  --    Microbiology Recent Results (from the past 240 hour(s))  MRSA PCR Screening     Status: None   Collection Time:  05/03/17 12:37 PM  Result Value Ref Range Status   MRSA by PCR NEGATIVE NEGATIVE Final    Comment:        The GeneXpert MRSA Assay (FDA approved for NASAL specimens only), is one component of a comprehensive MRSA colonization surveillance program. It is not intended to diagnose MRSA infection nor to guide or monitor treatment for MRSA infections.      Medications:   . amLODipine  10 mg Oral Daily  . aspirin EC  81 mg Oral Daily  . carvedilol  6.25 mg Oral BID WC  . FLUoxetine  40 mg Oral Daily  . heparin  5,000 Units Subcutaneous Q8H  . vitamin B-12  1,000 mcg Oral Daily   Continuous Infusions: . sodium chloride Stopped (05/03/17 1423)  . sodium chloride Stopped (05/03/17 1645)  . cefTRIAXone (ROCEPHIN)  IV    . dextrose 5 % and 0.45% NaCl    . dextrose 5 % and 0.45% NaCl 125 mL/hr at 05/04/17 0600  . insulin (NOVOLIN-R) infusion 4 Units/hr (05/04/17 0630)  . sodium chloride        LOS: 1 day   Charlynne Cousins  [facility] Pager [phone]  *Please refer to [email or web address], password [email or web address] to get updated schedule on who will round on this patient, as hospitalists switch teams weekly. If 7PM-7AM, please contact night-coverage at [email or web address], password [email or web address] for any overnight needs.  05/04/2017, 7:13 AM

## 2017-05-05 LAB — GLUCOSE, CAPILLARY
Glucose-Capillary: 134 mg/dL — ABNORMAL HIGH (ref 65–99)
Glucose-Capillary: 136 mg/dL — ABNORMAL HIGH (ref 65–99)
Glucose-Capillary: 137 mg/dL — ABNORMAL HIGH (ref 65–99)
Glucose-Capillary: 137 mg/dL — ABNORMAL HIGH (ref 65–99)
Glucose-Capillary: 143 mg/dL — ABNORMAL HIGH (ref 65–99)
Glucose-Capillary: 152 mg/dL — ABNORMAL HIGH (ref 65–99)
Glucose-Capillary: 152 mg/dL — ABNORMAL HIGH (ref 65–99)
Glucose-Capillary: 156 mg/dL — ABNORMAL HIGH (ref 65–99)
Glucose-Capillary: 165 mg/dL — ABNORMAL HIGH (ref 65–99)
Glucose-Capillary: 166 mg/dL — ABNORMAL HIGH (ref 65–99)
Glucose-Capillary: 169 mg/dL — ABNORMAL HIGH (ref 65–99)
Glucose-Capillary: 190 mg/dL — ABNORMAL HIGH (ref 65–99)
Glucose-Capillary: 191 mg/dL — ABNORMAL HIGH (ref 65–99)
Glucose-Capillary: 191 mg/dL — ABNORMAL HIGH (ref 65–99)
Glucose-Capillary: 201 mg/dL — ABNORMAL HIGH (ref 65–99)
Glucose-Capillary: 212 mg/dL — ABNORMAL HIGH (ref 65–99)
Glucose-Capillary: 236 mg/dL — ABNORMAL HIGH (ref 65–99)
Glucose-Capillary: 239 mg/dL — ABNORMAL HIGH (ref 65–99)
Glucose-Capillary: 253 mg/dL — ABNORMAL HIGH (ref 65–99)
Glucose-Capillary: 267 mg/dL — ABNORMAL HIGH (ref 65–99)

## 2017-05-05 LAB — BASIC METABOLIC PANEL
Anion gap: 12 (ref 5–15)
Anion gap: 12 (ref 5–15)
Anion gap: 12 (ref 5–15)
Anion gap: 7 (ref 5–15)
BUN: 33 mg/dL — ABNORMAL HIGH (ref 6–20)
BUN: 29 mg/dL — ABNORMAL HIGH (ref 6–20)
BUN: 27 mg/dL — ABNORMAL HIGH (ref 6–20)
BUN: 24 mg/dL — ABNORMAL HIGH (ref 6–20)
CO2: 16 mmol/L — ABNORMAL LOW (ref 22–32)
CO2: 15 mmol/L — ABNORMAL LOW (ref 22–32)
CO2: 17 mmol/L — ABNORMAL LOW (ref 22–32)
CO2: 19 mmol/L — ABNORMAL LOW (ref 22–32)
Calcium: 8.2 mg/dL — ABNORMAL LOW (ref 8.9–10.3)
Calcium: 8 mg/dL — ABNORMAL LOW (ref 8.9–10.3)
Calcium: 8.3 mg/dL — ABNORMAL LOW (ref 8.9–10.3)
Calcium: 8.2 mg/dL — ABNORMAL LOW (ref 8.9–10.3)
Chloride: 110 mmol/L (ref 101–111)
Chloride: 108 mmol/L (ref 101–111)
Chloride: 108 mmol/L (ref 101–111)
Chloride: 109 mmol/L (ref 101–111)
Creatinine, Ser: 1.93 mg/dL — ABNORMAL HIGH (ref 0.61–1.24)
Creatinine, Ser: 1.89 mg/dL — ABNORMAL HIGH (ref 0.61–1.24)
Creatinine, Ser: 1.78 mg/dL — ABNORMAL HIGH (ref 0.61–1.24)
Creatinine, Ser: 1.52 mg/dL — ABNORMAL HIGH (ref 0.61–1.24)
GFR calc Af Amer: 38 mL/min — ABNORMAL LOW (ref >60)
GFR calc Af Amer: 39 mL/min — ABNORMAL LOW (ref >60)
GFR calc Af Amer: 42 mL/min — ABNORMAL LOW (ref >60)
GFR calc Af Amer: 51 mL/min — ABNORMAL LOW (ref >60)
GFR calc non Af Amer: 33 mL/min — ABNORMAL LOW (ref >60)
GFR calc non Af Amer: 34 mL/min — ABNORMAL LOW (ref >60)
GFR calc non Af Amer: 36 mL/min — ABNORMAL LOW (ref >60)
GFR calc non Af Amer: 44 mL/min — ABNORMAL LOW (ref >60)
Glucose, Bld: 153 mg/dL — ABNORMAL HIGH (ref 65–99)
Glucose, Bld: 276 mg/dL — ABNORMAL HIGH (ref 65–99)
Glucose, Bld: 192 mg/dL — ABNORMAL HIGH (ref 65–99)
Glucose, Bld: 275 mg/dL — ABNORMAL HIGH (ref 65–99)
Potassium: 4.1 mmol/L (ref 3.5–5.1)
Potassium: 4.1 mmol/L (ref 3.5–5.1)
Potassium: 4.5 mmol/L (ref 3.5–5.1)
Potassium: 4.4 mmol/L (ref 3.5–5.1)
Sodium: 138 mmol/L (ref 135–145)
Sodium: 135 mmol/L (ref 135–145)
Sodium: 137 mmol/L (ref 135–145)
Sodium: 135 mmol/L (ref 135–145)

## 2017-05-05 LAB — URINE CULTURE: Culture: >=100000 — AB

## 2017-05-05 MED ORDER — DEXTROSE 5 % IV SOLN
1.0000 g | INTRAVENOUS | Status: DC
  Administered 2017-05-05 – 2017-05-06 (×2): 1 g via INTRAVENOUS
  Filled 2017-05-05 (×2): qty 10

## 2017-05-05 MED ORDER — SODIUM CHLORIDE 0.9 % IV SOLN
0.1000 [IU]/h | INTRAVENOUS | Status: DC
  Filled 2017-05-05: qty 1

## 2017-05-05 MED ORDER — DEXTROSE-NACL 5-0.45 % IV SOLN
INTRAVENOUS | Status: DC
  Administered 2017-05-05: 21:00:00 via INTRAVENOUS

## 2017-05-05 NOTE — Progress Notes (Signed)
Inpatient Diabetes Program Recommendations  AACE/ADA: New Consensus Statement on Inpatient Glycemic Control (2015)  Target Ranges:  Prepandial:   less than 140 mg/dL      Peak postprandial:   less than 180 mg/dL (1-2 hours)      Critically ill patients:  140 - 180 mg/dL   Lab Results  Component Value Date   GLUCAP 165 (H) 05/05/2017   HGBA1C 11.6 (H) 05/03/2017    Review of Glycemic Control  LATE ENTRY  Spoke with pt on 7/23 regarding high blood sugars and questioned why he missed his insulin doses. Pt states he had not gone to drugstore to pick it up. States he has a hard time affording the insulin. Also has transportation problems.   7th admission in past 6 months!  Appears to need 24 hour care with insulin administration and continued hypo- or hyperglycemia. Pt needs more supervision if being discharged home. Would recommend SNF placement for safety of patient.  When criteria is met for discontinuation of insulin drip, give Lantus 7 units QHS. Novolog 0-9 units tidwc and hs + 3 units tidwc  Will follow closely.   Thank you. Lorenda Peck, RD, LDN, CDE Inpatient Diabetes Coordinator [phone]

## 2017-05-05 NOTE — Progress Notes (Signed)
Dr. Venetia Constable aware of BMP.  And pt status.

## 2017-05-05 NOTE — Progress Notes (Signed)
Dr. Venetia Constable notified of BMP.

## 2017-05-05 NOTE — Progress Notes (Signed)
CSW consulted to assist with SNF placement. PT eval is needed, when medically appropriate, to help determine dc needs.  Werner Lean LCSW [phone]

## 2017-05-05 NOTE — Progress Notes (Addendum)
[facility] PROGRESS NOTE    Progress Note  [name] [name]  ZOX:[id number] DOB: 20-Dec-1945 DOA: 05/03/2017 PCP: [name], [name]     Brief Narrative:   [name] [name] is an 71 y.o. male past medical history diverticulitis previous DKA comes in with polyuria and polydipsia. He has not been getting his insulin regularly due to insurance.  Assessment/Plan:   DKA (diabetic ketoacidoses) ([name])/Uncontrolled type 2 diabetes mellitus with diabetic nephropathy, with long-term current use of insulin ([place])  He continues to be in DKA, will continue IV insulin, anion gap has closed, cont IV insulin until HCO3>20 We'll continue to monitor and replete potassium as needed. We cannot take him off insulin until HCO3>20.  AKI: Baseline creatinine is less than 1, admission 3.4. Likely prerenal in etiology, cont IV fluids, creatinine is improving slowly.  Urinary tract infection without hematuria Resume IV Antibiotics, UC grew klebsiella.  Normocytic anemia. Follow-up with PCP as an outpatient.   DVT prophylaxis: lovenox Family Communication:none Disposition Plan/Barrier to D/C: home in 2 days Code Status:     Code Status Orders        Start     Ordered   05/03/17 1233  Full code  Continuous     05/03/17 1232    Code Status History    Date Active Date Inactive Code Status Order ID Comments User Context   05/03/2017 10:45 AM 05/03/2017 12:32 PM DNR [id number]  Benito Mccreedy, MD ED   03/22/2017 11:51 PM 03/31/2017  3:46 PM DNR [id number]  Karmen Bongo, MD Inpatient   01/30/2017  3:02 AM 02/01/2017  7:11 PM Full Code [id number]  Gardiner Barefoot, NP Inpatient   01/29/2017 10:22 PM 01/30/2017  3:02 AM Full Code [id number]  Karmen Bongo, MD Inpatient   01/16/2017 10:09 PM 01/20/2017  6:43 PM Full Code [id number]  Toy Baker, MD Inpatient   12/28/2016  5:12 PM 12/30/2016  7:24 PM Full Code [id number]  Hosie Poisson, MD ED   12/14/2016 12:38 AM 12/17/2016  9:10 PM Full Code [id number]   Vianne Bulls, MD ED   11/16/2016 11:17 AM 11/20/2016 10:30 PM Full Code [id number]  Radene Gunning, NP ED   10/01/2016  6:28 PM 10/07/2016  7:53 PM Full Code [id number]  Omar Person, NP ED   08/17/2016  2:38 PM 08/19/2016 10:16 PM Full Code [id number]  Maren Reamer, MD ED   07/06/2016  1:30 AM 07/07/2016 10:43 PM Full Code [id number]  Lily Kocher, MD Inpatient   06/15/2016 10:07 AM 06/21/2016  7:21 PM Full Code [id number]  Tawni Millers, MD Inpatient   05/15/2016  9:35 PM 05/17/2016  4:53 PM Full Code [id number]  Vianne Bulls, MD ED   05/09/2016  2:52 AM 05/12/2016  5:16 PM Full Code [id number]  Roney Jaffe, MD Inpatient   11/01/2015  6:37 PM 11/03/2015  5:08 PM Full Code [id number]  Robbie Lis, MD Inpatient   09/26/2015 12:12 AM 09/27/2015  3:42 PM Full Code [id number]  Etta Quill, DO ED   08/21/2015  5:25 PM 08/24/2015  9:20 PM Full Code [id number]  Domenic Polite, MD Inpatient   08/12/2015  4:37 PM 08/14/2015  2:45 PM Full Code [id number]  Robbie Lis, MD ED   08/08/2015 12:37 AM 08/10/2015  6:30 PM Full Code [id number]  Allyne Gee, MD ED   07/05/2015  9:21 AM 07/09/2015  4:35 PM Full Code [id number]  Chesley Mires, MD ED   07/02/2015  6:36 PM 07/04/2015 10:36 PM  Full Code [id number]  Marshell Garfinkel, MD ED   03/20/2015 11:06 AM 03/23/2015  3:50 PM Full Code [id number]  Allie Bossier, MD ED   03/20/2015 12:32 AM 03/20/2015 11:06 AM Full Code [id number]  Lavina Hamman, MD ED   03/19/2014  9:57 PM 03/22/2014 12:32 PM Full Code [id number]  Rise Patience, MD Inpatient   01/21/2014  3:06 PM 01/25/2014 12:12 PM Full Code [id number]  Eugenie Filler, MD ED   07/03/2013  8:50 PM 07/05/2013  4:09 PM Full Code [id number]  Theodis Blaze, MD ED   05/29/2013 10:49 PM 05/31/2013  4:02 PM Full Code [id number]  Merton Border, MD Inpatient   02/05/2013  7:19 PM 02/09/2013  5:40 PM Full Code [id number]  [name], MD Inpatient   02/02/2013  8:57 PM 02/05/2013  7:19 PM Full Code [id number]  Erline Hau, MD ED    Advance Directive Documentation     Most Recent Value  Type of Advance Directive  Healthcare Power of Attorney  Pre-existing out of facility DNR order (yellow form or pink MOST form)  -  "MOST" Form in Place?  -        IV Access:    Peripheral IV   Procedures and diagnostic studies:   No results found.   Medical Consultants:    None.  Anti-Infectives:   Rocephin  Subjective:    [name] [name] No new complains  Objective:    Vitals:   05/05/17 0400 05/05/17 0500 05/05/17 0513 05/05/17 0522  BP: 123/68 (!) 143/63    Pulse: (!) 57 (!) 57 (!) 58   Resp: 15 20 (!) 21   Temp:      TempSrc:      SpO2: 96% 98% 97%   Weight:    67.2 kg (148 lb 2.4 oz)  Height:        Intake/Output Summary (Last 24 hours) at 05/05/17 0705 Last data filed at 05/05/17 0557  Gross per 24 hour  Intake          2146.51 ml  Output             2400 ml  Net          -253.49 ml   Filed Weights   05/03/17 0854 05/04/17 0315 05/05/17 0522  Weight: 61.2 kg (135 lb) 64.9 kg (143 lb 1.3 oz) 67.2 kg (148 lb 2.4 oz)    Exam: General exam: In no acute distress Respiratory system: good air movement CTA B/L Cardiovascular system: RRR + S1 and S2. Gastrointestinal system:Soft, NT, ND. Central nervous system: A&Ox3 Extremities: No edema Skin: No rashes. Psychiatry: JUdgment and insight are normal.   Data Reviewed:    Labs: Basic Metabolic Panel:  Recent Labs Lab 05/03/17 1247  05/04/17 0037 05/04/17 0335 05/04/17 0820 05/04/17 1425 05/05/17 0621  NA 135  < > 139 135 137 136 138  K 4.1  < > 4.0 4.4 3.5 4.2 4.1  CL 103  < > 109 109 108 111 110  CO2 8*  < > 17* 14* 19* 16* 16*  GLUCOSE 490*  < > 163* 327* 131* 244* 153*  BUN 79*  < > 66* 65* 62* 55* 33*  CREATININE 4.66*  < > 3.71* 3.40* 3.13* 2.88* 1.93*  CALCIUM 8.5*  < > 8.3* 8.0* 8.2* 7.6* 8.2*  MG 2.7*  --   --   --   --   --   --   PHOS 6.2*  --   --   --   --   --   --   < > =  values in this  interval not displayed. GFR Estimated Creatinine Clearance: 32.9 mL/min (A) (by C-G formula based on SCr of 1.93 mg/dL (H)). Liver Function Tests: No results for input(s): AST, ALT, ALKPHOS, BILITOT, PROT, ALBUMIN in the last 168 hours. No results for input(s): LIPASE, AMYLASE in the last 168 hours. No results for input(s): AMMONIA in the last 168 hours. Coagulation profile No results for input(s): INR, PROTIME in the last 168 hours.  CBC:  Recent Labs Lab 05/03/17 0847 05/03/17 1247  WBC 15.1* 14.3*  HGB 10.6* 10.7*  HCT 32.2* 31.1*  MCV 93.6 88.6  PLT 264 252   Cardiac Enzymes: No results for input(s): CKTOTAL, CKMB, CKMBINDEX, TROPONINI in the last 168 hours. BNP (last 3 results) No results for input(s): PROBNP in the last 8760 hours. CBG:  Recent Labs Lab 05/05/17 0107 05/05/17 0205 05/05/17 0333 05/05/17 0449 05/05/17 0556  GLUCAP 143* 136* 137* 156* 137*   D-Dimer: No results for input(s): DDIMER in the last 72 hours. Hgb A1c:  Recent Labs  05/03/17 1247  HGBA1C 11.6*   Lipid Profile: No results for input(s): CHOL, HDL, LDLCALC, TRIG, CHOLHDL, LDLDIRECT in the last 72 hours. Thyroid function studies: No results for input(s): TSH, T4TOTAL, T3FREE, THYROIDAB in the last 72 hours.  Invalid input(s): FREET3 Anemia work up: No results for input(s): VITAMINB12, FOLATE, FERRITIN, TIBC, IRON, RETICCTPCT in the last 72 hours. Sepsis Labs:  Recent Labs Lab 05/03/17 0847 05/03/17 0853 05/03/17 1247  WBC 15.1*  --  14.3*  LATICACIDVEN  --  2.18*  --    Microbiology Recent Results (from the past 240 hour(s))  Culture, Urine     Status: Abnormal (Preliminary result)   Collection Time: 05/03/17  8:19 AM  Result Value Ref Range Status   Specimen Description URINE, CATHETERIZED  Final   Special Requests NONE  Final   Culture >=100,000 COLONIES/mL KLEBSIELLA PNEUMONIAE (A)  Final   Report Status PENDING  Incomplete  MRSA PCR Screening     Status: None    Collection Time: 05/03/17 12:37 PM  Result Value Ref Range Status   MRSA by PCR NEGATIVE NEGATIVE Final    Comment:        The GeneXpert MRSA Assay (FDA approved for NASAL specimens only), is one component of a comprehensive MRSA colonization surveillance program. It is not intended to diagnose MRSA infection nor to guide or monitor treatment for MRSA infections.      Medications:   . amLODipine  10 mg Oral Daily  . aspirin EC  81 mg Oral Daily  . carvedilol  6.25 mg Oral BID WC  . FLUoxetine  40 mg Oral Daily  . heparin  5,000 Units Subcutaneous Q8H  . insulin detemir  5 Units Subcutaneous Daily  . vitamin B-12  1,000 mcg Oral Daily   Continuous Infusions: . dextrose 5 % and 0.45% NaCl 100 mL/hr at 05/05/17 0500  . insulin (NOVOLIN-R) infusion Stopped (05/05/17 0557)      LOS: 2 days   Charlynne Cousins  [facility] Pager [phone]  *Please refer to [email or web address], password [email or web address] to get updated schedule on who will round on this patient, as hospitalists switch teams weekly. If 7PM-7AM, please contact night-coverage at [email or web address], password [email or web address] for any overnight needs.  05/05/2017, 7:05 AM

## 2017-05-06 DIAGNOSIS — Z794 Long term (current) use of insulin: Secondary | ICD-10-CM

## 2017-05-06 DIAGNOSIS — E1165 Type 2 diabetes mellitus with hyperglycemia: Secondary | ICD-10-CM

## 2017-05-06 DIAGNOSIS — N179 Acute kidney failure, unspecified: Secondary | ICD-10-CM

## 2017-05-06 DIAGNOSIS — N3000 Acute cystitis without hematuria: Secondary | ICD-10-CM

## 2017-05-06 DIAGNOSIS — E1121 Type 2 diabetes mellitus with diabetic nephropathy: Secondary | ICD-10-CM

## 2017-05-06 DIAGNOSIS — E1010 Type 1 diabetes mellitus with ketoacidosis without coma: Secondary | ICD-10-CM

## 2017-05-06 LAB — BASIC METABOLIC PANEL
Anion gap: 8 (ref 5–15)
BUN: 20 mg/dL (ref 6–20)
CO2: 20 mmol/L — ABNORMAL LOW (ref 22–32)
Calcium: 8.3 mg/dL — ABNORMAL LOW (ref 8.9–10.3)
Chloride: 108 mmol/L (ref 101–111)
Creatinine, Ser: 1.38 mg/dL — ABNORMAL HIGH (ref 0.61–1.24)
GFR calc Af Amer: 57 mL/min — ABNORMAL LOW (ref >60)
GFR calc non Af Amer: 50 mL/min — ABNORMAL LOW (ref >60)
Glucose, Bld: 110 mg/dL — ABNORMAL HIGH (ref 65–99)
Potassium: 4 mmol/L (ref 3.5–5.1)
Sodium: 136 mmol/L (ref 135–145)

## 2017-05-06 LAB — GLUCOSE, CAPILLARY
Glucose-Capillary: 100 mg/dL — ABNORMAL HIGH (ref 65–99)
Glucose-Capillary: 103 mg/dL — ABNORMAL HIGH (ref 65–99)
Glucose-Capillary: 107 mg/dL — ABNORMAL HIGH (ref 65–99)
Glucose-Capillary: 115 mg/dL — ABNORMAL HIGH (ref 65–99)
Glucose-Capillary: 122 mg/dL — ABNORMAL HIGH (ref 65–99)
Glucose-Capillary: 190 mg/dL — ABNORMAL HIGH (ref 65–99)
Glucose-Capillary: 197 mg/dL — ABNORMAL HIGH (ref 65–99)
Glucose-Capillary: 219 mg/dL — ABNORMAL HIGH (ref 65–99)
Glucose-Capillary: 53 mg/dL — ABNORMAL LOW (ref 65–99)
Glucose-Capillary: 93 mg/dL (ref 65–99)

## 2017-05-06 MED ORDER — INSULIN ASPART 100 UNIT/ML ~~LOC~~ SOLN
0.0000 [IU] | Freq: Three times a day (TID) | SUBCUTANEOUS | Status: DC
  Administered 2017-05-06: 3 [IU] via SUBCUTANEOUS
  Administered 2017-05-07: 11 [IU] via SUBCUTANEOUS
  Administered 2017-05-08 (×2): 3 [IU] via SUBCUTANEOUS

## 2017-05-06 MED ORDER — INSULIN ASPART 100 UNIT/ML ~~LOC~~ SOLN
0.0000 [IU] | Freq: Every day | SUBCUTANEOUS | Status: DC
  Administered 2017-05-06: 2 [IU] via SUBCUTANEOUS

## 2017-05-06 MED ORDER — AMOXICILLIN-POT CLAVULANATE 875-125 MG PO TABS
1.0000 | ORAL_TABLET | Freq: Two times a day (BID) | ORAL | Status: DC
  Administered 2017-05-07 – 2017-05-08 (×3): 1 via ORAL
  Filled 2017-05-06 (×3): qty 1

## 2017-05-06 MED ORDER — INSULIN DETEMIR 100 UNIT/ML ~~LOC~~ SOLN
5.0000 [IU] | Freq: Two times a day (BID) | SUBCUTANEOUS | Status: DC
  Administered 2017-05-06 (×2): 5 [IU] via SUBCUTANEOUS
  Filled 2017-05-06 (×2): qty 0.05

## 2017-05-06 MED ORDER — INSULIN DETEMIR 100 UNIT/ML ~~LOC~~ SOLN
7.0000 [IU] | Freq: Every day | SUBCUTANEOUS | Status: DC
  Administered 2017-05-06 – 2017-05-07 (×2): 7 [IU] via SUBCUTANEOUS
  Filled 2017-05-06 (×3): qty 0.07

## 2017-05-06 NOTE — Progress Notes (Signed)
PROGRESS NOTE    [name] [name]  NIO:[id number] DOB: [birth year] DOA: 05/03/2017 PCP: [name], [name]    Brief Narrative:  [name] is an 71 y.o. male past medical history diverticulitis previous DKA comes in with polyuria and polydipsia. He has not been getting his insulin regularly due to insurance.    Assessment & Plan:   Active Problems:   Uncontrolled type 2 diabetes mellitus with diabetic nephropathy, with long-term current use of insulin ([name])   AKI (acute kidney injury) ([place])   Dehydration   Urinary tract infection without hematuria   DKA (diabetic ketoacidoses) ([name])   DKA (diabetic ketoacidoses) ([name])/Uncontrolled type 2 diabetes mellitus with diabetic nephropathy, with long-term current use of insulin ([place])   likely secondary to medical noncompliance. Patient was on glucose stabilizer however continued to be in DKA with gap subsequently closing and bicarbonate now approximately 20. Patient has been transitioned off of insulin drip and currently on Lantus 5 units twice daily. CBGs have ranged from 53 -197. Hemoglobin A1c is 11.6. Will change Lantus to 7 units daily at bedtime. Patient with multiple hospitalizations of 7 admissions in the past 6 months. PT OT evaluating patient for possible placement in SNF. If patient refuses SNF will need to send home with home health RN to monitor patient's compliance with his insulin regimen.  Acute renal failure: Baseline creatinine is less than 1, admission creatinine was 4.66. Likely prerenal in etiology and secondary to dehydration and DKA. Renal function improving daily and creatinine currently at 1.38. Avoid nephrotoxic agents.  Klebsiella pneumonia Urinary tract infection without hematuria Patient has received 2 days of IV Rocephin. Patient still with a leukocytosis. Patient afebrile. Urine cultures growing Klebsiella pneumonia resistant to ampicillin however sensitive to ampicillin sulbactam, cephalosporins, quinolones,  Bactrim. Will change IV Rocephin to oral Augmentin to complete course of antibiotic treatment.  Normocytic anemia. Follow-up with PCP as an outpatient.    DVT prophylaxis: Heparin Code Status: Full Family Communication: Updated patient. No family at bedside. Disposition Plan: Transfer to [place]. Skilled nursing facility versus home with home health when medically stable and improved.   Consultants:   Diabetes coordinator  Procedures:   None  Antimicrobials:   IV Rocephin 2 days stop date 05/06/2017  Augmentin 05/06/2017   Subjective: Patient laying in bed. No chest pain. No shortness of breath. Patient states he's feeling better. Patient states he's of April 12 40s medications.  Objective: Vitals:   05/06/17 0614 05/06/17 0630 05/06/17 0800 05/06/17 0935  BP:  (!) 159/85  (!) 119/56  Pulse: 63     Resp: (!) 25     Temp:   97.9 F (36.6 C)   TempSrc:   Oral   SpO2: 100%     Weight:      Height:        Intake/Output Summary (Last 24 hours) at 05/06/17 1054 Last data filed at 05/06/17 0600  Gross per 24 hour  Intake          1630.25 ml  Output             5400 ml  Net         -3769.75 ml   Filed Weights   05/04/17 0315 05/05/17 0522 05/06/17 0600  Weight: 64.9 kg (143 lb 1.3 oz) 67.2 kg (148 lb 2.4 oz) 66.3 kg (146 lb 2.6 oz)    Examination:  General exam: Appears calm and comfortable  Respiratory system: Clear to auscultation anterior lung fields. Respiratory effort normal. Cardiovascular  system: S1 & S2 heard, RRR. No JVD, murmurs, rubs, gallops or clicks. No pedal edema. Gastrointestinal system: Abdomen is nondistended, soft and nontender. No organomegaly or masses felt. Normal bowel sounds heard. Central nervous system: Alert and oriented. No focal neurological deficits. Extremities: Symmetric 5 x 5 power. Skin: No rashes, lesions or ulcers Psychiatry: Judgement and insight appear fair. Mood & affect appropriate.     Data Reviewed: I have  personally reviewed following labs and imaging studies  CBC:  Recent Labs Lab 05/03/17 0847 05/03/17 1247  WBC 15.1* 14.3*  HGB 10.6* 10.7*  HCT 32.2* 31.1*  MCV 93.6 88.6  PLT 264 428   Basic Metabolic Panel:  Recent Labs Lab 05/03/17 1247  05/05/17 0621 05/05/17 1033 05/05/17 1408 05/05/17 2042 05/06/17 0038  NA 135  < > 138 135 137 135 136  K 4.1  < > 4.1 4.1 4.5 4.4 4.0  CL 103  < > 110 108 108 109 108  CO2 8*  < > 16* 15* 17* 19* 20*  GLUCOSE 490*  < > 153* 276* 192* 275* 110*  BUN 79*  < > 33* 29* 27* 24* 20  CREATININE 4.66*  < > 1.93* 1.89* 1.78* 1.52* 1.38*  CALCIUM 8.5*  < > 8.2* 8.0* 8.3* 8.2* 8.3*  MG 2.7*  --   --   --   --   --   --   PHOS 6.2*  --   --   --   --   --   --   < > = values in this interval not displayed. GFR: Estimated Creatinine Clearance: 45.4 mL/min (A) (by C-G formula based on SCr of 1.38 mg/dL (H)). Liver Function Tests: No results for input(s): AST, ALT, ALKPHOS, BILITOT, PROT, ALBUMIN in the last 168 hours. No results for input(s): LIPASE, AMYLASE in the last 168 hours. No results for input(s): AMMONIA in the last 168 hours. Coagulation Profile: No results for input(s): INR, PROTIME in the last 168 hours. Cardiac Enzymes: No results for input(s): CKTOTAL, CKMB, CKMBINDEX, TROPONINI in the last 168 hours. BNP (last 3 results) No results for input(s): PROBNP in the last 8760 hours. HbA1C:  Recent Labs  05/03/17 1247  HGBA1C 11.6*   CBG:  Recent Labs Lab 05/06/17 0216 05/06/17 0328 05/06/17 0428 05/06/17 0755 05/06/17 0857  GLUCAP 103* 122* 107* 53* 197*   Lipid Profile: No results for input(s): CHOL, HDL, LDLCALC, TRIG, CHOLHDL, LDLDIRECT in the last 72 hours. Thyroid Function Tests: No results for input(s): TSH, T4TOTAL, FREET4, T3FREE, THYROIDAB in the last 72 hours. Anemia Panel: No results for input(s): VITAMINB12, FOLATE, FERRITIN, TIBC, IRON, RETICCTPCT in the last 72 hours. Sepsis Labs:  Recent Labs Lab  05/03/17 0853  LATICACIDVEN 2.18*    Recent Results (from the past 240 hour(s))  Culture, Urine     Status: Abnormal   Collection Time: 05/03/17  8:19 AM  Result Value Ref Range Status   Specimen Description URINE, CATHETERIZED  Final   Special Requests NONE  Final   Culture >=100,000 COLONIES/mL KLEBSIELLA PNEUMONIAE (A)  Final   Report Status 05/05/2017 FINAL  Final   Organism ID, Bacteria KLEBSIELLA PNEUMONIAE (A)  Final      Susceptibility   Klebsiella pneumoniae - MIC*    AMPICILLIN RESISTANT Resistant     CEFAZOLIN <=4 SENSITIVE Sensitive     CEFTRIAXONE <=1 SENSITIVE Sensitive     CIPROFLOXACIN <=0.25 SENSITIVE Sensitive     GENTAMICIN <=1 SENSITIVE Sensitive  IMIPENEM <=0.25 SENSITIVE Sensitive     NITROFURANTOIN <=16 SENSITIVE Sensitive     TRIMETH/SULFA <=20 SENSITIVE Sensitive     AMPICILLIN/SULBACTAM 4 SENSITIVE Sensitive     PIP/TAZO <=4 SENSITIVE Sensitive     Extended ESBL NEGATIVE Sensitive     * >=100,000 COLONIES/mL KLEBSIELLA PNEUMONIAE  MRSA PCR Screening     Status: None   Collection Time: 05/03/17 12:37 PM  Result Value Ref Range Status   MRSA by PCR NEGATIVE NEGATIVE Final    Comment:        The GeneXpert MRSA Assay (FDA approved for NASAL specimens only), is one component of a comprehensive MRSA colonization surveillance program. It is not intended to diagnose MRSA infection nor to guide or monitor treatment for MRSA infections.          Radiology Studies: No results found.      Scheduled Meds: . amLODipine  10 mg Oral Daily  . aspirin EC  81 mg Oral Daily  . carvedilol  6.25 mg Oral BID WC  . FLUoxetine  40 mg Oral Daily  . heparin  5,000 Units Subcutaneous Q8H  . insulin aspart  0-15 Units Subcutaneous TID WC  . insulin aspart  0-5 Units Subcutaneous QHS  . insulin detemir  5 Units Subcutaneous BID  . vitamin B-12  1,000 mcg Oral Daily   Continuous Infusions: . cefTRIAXone (ROCEPHIN)  IV Stopped (05/06/17 0840)      LOS: 3 days    Time spent: 49 minutes    Selina Tapper, MD [facility] Pager [phone]  If 7PM-7AM, please contact night-coverage [email or web address] Password [facility] 05/06/2017, 10:54 AM

## 2017-05-06 NOTE — Progress Notes (Signed)
Nurse informed provider of the latest BMP and CBG results. Glucose stabilizer instructed the nurse to hold the insulin gtt for 1 hour. Provider gave orders to not stop insulin gtt. D5 1/2NS increased, long-acting insulin initiated. Nurse will continue to monitor q1hr CBG

## 2017-05-06 NOTE — Progress Notes (Signed)
Provider was notified that patient no longer had BMPs ordered. Also made aware that patient is now on a non-titratable insulin gtt without any coverage for meals. Provider gave order to restart DKA protocol with glucose stabilizer. Continue to monitor q4 BMPs.

## 2017-05-06 NOTE — Progress Notes (Signed)
OT Cancellation Note  Patient Details Name: Kaci Freel MRN: [id number] DOB: 08-23-1946   Cancelled Treatment:    Reason Eval/Treat Not Completed: Other (comment).  Pt is being transferred from ICU. Will check back tomorrow.  Trenice Mesa 05/06/2017, 3:03 PM  Lesle Chris, OTR/L [phone] 05/06/2017

## 2017-05-07 ENCOUNTER — Ambulatory Visit: Payer: Medicare Other | Admitting: Family Medicine

## 2017-05-07 DIAGNOSIS — E875 Hyperkalemia: Secondary | ICD-10-CM

## 2017-05-07 DIAGNOSIS — E860 Dehydration: Secondary | ICD-10-CM

## 2017-05-07 LAB — CBC
HCT: 30.6 % — ABNORMAL LOW (ref 39.0–52.0)
Hemoglobin: 10.8 g/dL — ABNORMAL LOW (ref 13.0–17.0)
MCH: 30 pg (ref 26.0–34.0)
MCHC: 35.3 g/dL (ref 30.0–36.0)
MCV: 85 fL (ref 78.0–100.0)
Platelets: 135 10*3/uL — ABNORMAL LOW (ref 150–400)
RBC: 3.6 MIL/uL — ABNORMAL LOW (ref 4.22–5.81)
RDW: 13.5 % (ref 11.5–15.5)
WBC: 6 10*3/uL (ref 4.0–10.5)

## 2017-05-07 LAB — BASIC METABOLIC PANEL
Anion gap: 8 (ref 5–15)
Anion gap: 11 (ref 5–15)
BUN: 16 mg/dL (ref 6–20)
BUN: 28 mg/dL — ABNORMAL HIGH (ref 6–20)
CO2: 23 mmol/L (ref 22–32)
CO2: 22 mmol/L (ref 22–32)
Calcium: 8.9 mg/dL (ref 8.9–10.3)
Calcium: 9 mg/dL (ref 8.9–10.3)
Chloride: 104 mmol/L (ref 101–111)
Chloride: 100 mmol/L — ABNORMAL LOW (ref 101–111)
Creatinine, Ser: 1.21 mg/dL (ref 0.61–1.24)
Creatinine, Ser: 1.59 mg/dL — ABNORMAL HIGH (ref 0.61–1.24)
GFR calc Af Amer: >60 mL/min (ref >60)
GFR calc Af Amer: 48 mL/min — ABNORMAL LOW (ref >60)
GFR calc non Af Amer: 58 mL/min — ABNORMAL LOW (ref >60)
GFR calc non Af Amer: 42 mL/min — ABNORMAL LOW (ref >60)
Glucose, Bld: 107 mg/dL — ABNORMAL HIGH (ref 65–99)
Glucose, Bld: 475 mg/dL — ABNORMAL HIGH (ref 65–99)
Potassium: 4 mmol/L (ref 3.5–5.1)
Potassium: 4.7 mmol/L (ref 3.5–5.1)
Sodium: 135 mmol/L (ref 135–145)
Sodium: 133 mmol/L — ABNORMAL LOW (ref 135–145)

## 2017-05-07 LAB — GLUCOSE, CAPILLARY
Glucose-Capillary: 67 mg/dL (ref 65–99)
Glucose-Capillary: 350 mg/dL — ABNORMAL HIGH (ref 65–99)
Glucose-Capillary: 425 mg/dL — ABNORMAL HIGH (ref 65–99)
Glucose-Capillary: 162 mg/dL — ABNORMAL HIGH (ref 65–99)

## 2017-05-07 MED ORDER — INSULIN ASPART 100 UNIT/ML ~~LOC~~ SOLN
19.0000 [IU] | Freq: Once | SUBCUTANEOUS | Status: AC
Start: 2017-05-07 — End: 2017-05-07
  Administered 2017-05-07: 19 [IU] via SUBCUTANEOUS

## 2017-05-07 MED ORDER — HYDROCODONE-ACETAMINOPHEN 5-325 MG PO TABS
1.0000 | ORAL_TABLET | Freq: Four times a day (QID) | ORAL | Status: DC | PRN
Start: 2017-05-07 — End: 2017-05-08
  Administered 2017-05-07: 1 via ORAL
  Filled 2017-05-07: qty 1

## 2017-05-07 NOTE — Progress Notes (Signed)
PROGRESS NOTE    [name] [name]  ZOX:[id number] DOB: 04-29-1946 DOA: 05/03/2017 PCP: [name], [name]    Brief Narrative:  [name] [name] is an 71 y.o. male past medical history diverticulitis previous DKA comes in with polyuria and polydipsia. He has not been getting his insulin regularly due to insurance.    Assessment & Plan:   Active Problems:   Uncontrolled type 2 diabetes mellitus with diabetic nephropathy, with long-term current use of insulin ([name])   AKI (acute kidney injury) ([place])   Dehydration   Urinary tract infection without hematuria   DKA (diabetic ketoacidoses) ([name])   DKA (diabetic ketoacidoses) ([name])/Uncontrolled type 2 diabetes mellitus with diabetic nephropathy, with long-term current use of insulin ([place])   likely secondary to medical noncompliance. Patient was on glucose stabilizer however continued to be in DKA with gap subsequently closing and bicarbonate now approximately 20. Patient has been transitioned off of insulin drip and currently on Lantus 5 units twice daily. CBGs have ranged from 67 -350. Hemoglobin A1c is 11.6. Continue Lantus 7 units daily at bedtime. Patient with multiple hospitalizations of 7 admissions in the past 6 months. PT OT evaluating patient for possible placement in SNF. If patient refuses SNF will need to send home with home health RN to monitor patient's compliance with his insulin regimen.  Acute renal failure: Baseline creatinine is less than 1, admission creatinine was 4.66. Likely prerenal in etiology and secondary to dehydration and DKA. Renal function improving daily and creatinine currently at 1.21. Avoid nephrotoxic agents.  Klebsiella pneumonia Urinary tract infection without hematuria Patient received 2 days of IV Rocephin. Patient still with a leukocytosis. Patient afebrile. Urine cultures growing Klebsiella pneumonia resistant to ampicillin however sensitive to ampicillin sulbactam, cephalosporins, quinolones, Bactrim.  Changed IV Rocephin to oral Augmentin to complete course of antibiotic treatment.  Normocytic anemia. Follow-up with PCP as an outpatient.    DVT prophylaxis: Heparin Code Status: Full Family Communication: Updated patient. No family at bedside. Disposition Plan: Transfer to [place]. Skilled nursing facility versus home with home health when medically stable and improved.   Consultants:   Diabetes coordinator  Procedures:   None  Antimicrobials:   IV Rocephin 2 days stop date 05/06/2017  Augmentin 05/06/2017   Subjective: Patient sitting up in bed eating lunch. Patient denies chest pain. No shortness of breath. Patient states clinically improved.   Objective: Vitals:   05/06/17 1445 05/06/17 1528 05/06/17 2130 05/07/17 0613  BP:  (!) 152/83 136/82 (!) 141/88  Pulse: 65 68 66 70  Resp: (!) 21 18 16 14   Temp:  98.5 F (36.9 C) 98.9 F (37.2 C) 99.1 F (37.3 C)  TempSrc:  Oral Oral Oral  SpO2: 100% 98% 99% 99%  Weight:      Height:        Intake/Output Summary (Last 24 hours) at 05/07/17 1223 Last data filed at 05/07/17 1008  Gross per 24 hour  Intake              600 ml  Output             2650 ml  Net            -2050 ml   Filed Weights   05/04/17 0315 05/05/17 0522 05/06/17 0600  Weight: 64.9 kg (143 lb 1.3 oz) 67.2 kg (148 lb 2.4 oz) 66.3 kg (146 lb 2.6 oz)    Examination:  General exam: Appears calm and comfortable  Respiratory system: Clear to auscultation anterior lung  fields. Respiratory effort normal. Cardiovascular system: S1 & S2 heard, RRR. No JVD, murmurs, rubs, gallops or clicks. No pedal edema. Gastrointestinal system: Abdomen is nondistended, soft and nontender. No organomegaly or masses felt. Normal bowel sounds heard. Central nervous system: Alert and oriented. No focal neurological deficits. Extremities: Symmetric 5 x 5 power. Skin: No rashes, lesions or ulcers Psychiatry: Judgement and insight appear fair. Mood & affect  appropriate.     Data Reviewed: I have personally reviewed following labs and imaging studies  CBC:  Recent Labs Lab 05/03/17 0847 05/03/17 1247 05/07/17 0426  WBC 15.1* 14.3* 6.0  HGB 10.6* 10.7* 10.8*  HCT 32.2* 31.1* 30.6*  MCV 93.6 88.6 85.0  PLT 264 252 466*   Basic Metabolic Panel:  Recent Labs Lab 05/03/17 1247  05/05/17 1033 05/05/17 1408 05/05/17 2042 05/06/17 0038 05/07/17 0426  NA 135  < > 135 137 135 136 135  K 4.1  < > 4.1 4.5 4.4 4.0 4.0  CL 103  < > 108 108 109 108 104  CO2 8*  < > 15* 17* 19* 20* 23  GLUCOSE 490*  < > 276* 192* 275* 110* 107*  BUN 79*  < > 29* 27* 24* 20 16  CREATININE 4.66*  < > 1.89* 1.78* 1.52* 1.38* 1.21  CALCIUM 8.5*  < > 8.0* 8.3* 8.2* 8.3* 8.9  MG 2.7*  --   --   --   --   --   --   PHOS 6.2*  --   --   --   --   --   --   < > = values in this interval not displayed. GFR: Estimated Creatinine Clearance: 51.7 mL/min (by C-G formula based on SCr of 1.21 mg/dL). Liver Function Tests: No results for input(s): AST, ALT, ALKPHOS, BILITOT, PROT, ALBUMIN in the last 168 hours. No results for input(s): LIPASE, AMYLASE in the last 168 hours. No results for input(s): AMMONIA in the last 168 hours. Coagulation Profile: No results for input(s): INR, PROTIME in the last 168 hours. Cardiac Enzymes: No results for input(s): CKTOTAL, CKMB, CKMBINDEX, TROPONINI in the last 168 hours. BNP (last 3 results) No results for input(s): PROBNP in the last 8760 hours. HbA1C: No results for input(s): HGBA1C in the last 72 hours. CBG:  Recent Labs Lab 05/06/17 1222 05/06/17 1701 05/06/17 2051 05/07/17 0732 05/07/17 1138  GLUCAP 190* 93 219* 67 350*   Lipid Profile: No results for input(s): CHOL, HDL, LDLCALC, TRIG, CHOLHDL, LDLDIRECT in the last 72 hours. Thyroid Function Tests: No results for input(s): TSH, T4TOTAL, FREET4, T3FREE, THYROIDAB in the last 72 hours. Anemia Panel: No results for input(s): VITAMINB12, FOLATE, FERRITIN,  TIBC, IRON, RETICCTPCT in the last 72 hours. Sepsis Labs:  Recent Labs Lab 05/03/17 0853  LATICACIDVEN 2.18*    Recent Results (from the past 240 hour(s))  Culture, Urine     Status: Abnormal   Collection Time: 05/03/17  8:19 AM  Result Value Ref Range Status   Specimen Description URINE, CATHETERIZED  Final   Special Requests NONE  Final   Culture >=100,000 COLONIES/mL KLEBSIELLA PNEUMONIAE (A)  Final   Report Status 05/05/2017 FINAL  Final   Organism ID, Bacteria KLEBSIELLA PNEUMONIAE (A)  Final      Susceptibility   Klebsiella pneumoniae - MIC*    AMPICILLIN RESISTANT Resistant     CEFAZOLIN <=4 SENSITIVE Sensitive     CEFTRIAXONE <=1 SENSITIVE Sensitive     CIPROFLOXACIN <=0.25 SENSITIVE Sensitive  GENTAMICIN <=1 SENSITIVE Sensitive     IMIPENEM <=0.25 SENSITIVE Sensitive     NITROFURANTOIN <=16 SENSITIVE Sensitive     TRIMETH/SULFA <=20 SENSITIVE Sensitive     AMPICILLIN/SULBACTAM 4 SENSITIVE Sensitive     PIP/TAZO <=4 SENSITIVE Sensitive     Extended ESBL NEGATIVE Sensitive     * >=100,000 COLONIES/mL KLEBSIELLA PNEUMONIAE  MRSA PCR Screening     Status: None   Collection Time: 05/03/17 12:37 PM  Result Value Ref Range Status   MRSA by PCR NEGATIVE NEGATIVE Final    Comment:        The GeneXpert MRSA Assay (FDA approved for NASAL specimens only), is one component of a comprehensive MRSA colonization surveillance program. It is not intended to diagnose MRSA infection nor to guide or monitor treatment for MRSA infections.          Radiology Studies: No results found.      Scheduled Meds: . amLODipine  10 mg Oral Daily  . amoxicillin-clavulanate  1 tablet Oral Q12H  . aspirin EC  81 mg Oral Daily  . carvedilol  6.25 mg Oral BID WC  . FLUoxetine  40 mg Oral Daily  . heparin  5,000 Units Subcutaneous Q8H  . insulin aspart  0-15 Units Subcutaneous TID WC  . insulin aspart  0-5 Units Subcutaneous QHS  . insulin detemir  7 Units Subcutaneous QHS    . vitamin B-12  1,000 mcg Oral Daily   Continuous Infusions:    LOS: 4 days    Time spent: 53 minutes    THOMPSON,DANIEL, MD [facility] Pager [phone]  If 7PM-7AM, please contact night-coverage [email or web address] Password [facility] 05/07/2017, 12:23 PM

## 2017-05-07 NOTE — Care Management Important Message (Signed)
Important Message  Patient Details  Name: [name] [name] MRN: [id number] Date of Birth: 07/14/1946   Medicare Important Message Given:  Yes    Kerin Salen 05/07/2017, 10:35 AMImportant Message  Patient Details  Name: [name] [name] MRN: [id number] Date of Birth: [birth year]   Medicare Important Message Given:  Yes    Kerin Salen 05/07/2017, 10:35 AM

## 2017-05-07 NOTE — Progress Notes (Signed)
CSW consulted to assist with d/c planning. PT eval completed. Pt is ambulating 400 ft. min guard. HHPT has been recommended. RNCM will assist with d/c planning.  CSW signing off.  Werner Lean LCSW [phone]

## 2017-05-07 NOTE — Progress Notes (Signed)
PT Cancellation Note  Patient Details Name: Rashi Giuliani MRN: [id number] DOB: [birth year]   Cancelled Treatment:    Reason Eval/Treat Not Completed: Fatigue/lethargy limiting ability to participatepatient just washed up and wants to walk after lunch. Will check back later.   Claretha Cooper 05/07/2017, 11:30 AM Tresa Endo PT [phone]

## 2017-05-07 NOTE — Evaluation (Signed)
Physical Therapy Evaluation Patient Details Name: [name] [name] MRN: [id number] DOB: 1946/07/01 Today's Date: 05/07/2017   History of Present Illness  pt was admitted for DKA.  PMH:  CVA, DM  Clinical Impression  The patient ambulated around  Unit with RW. Plans to DC to home. Pt admitted with above diagnosis. Pt currently with functional limitations due to the deficits listed below (see PT Problem List).  Pt will benefit from skilled PT to increase their independence and safety with mobility to allow discharge to the venue listed below.       Follow Up Recommendations Home health PT;Supervision/Assistance - 24 hour    Equipment Recommendations  Rolling walker with 5" wheels    Recommendations for Other Services       Precautions / Restrictions Precautions Precautions: Fall Restrictions Weight Bearing Restrictions: No      Mobility  Bed Mobility Overal bed mobility: Needs Assistance Bed Mobility: Supine to Sit;Sit to Supine     Supine to sit: Supervision Sit to supine: Supervision   General bed mobility comments: light assist for legs for back to bed.  Changed technique for back to bed due to back pain  Transfers Overall transfer level: Needs assistance Equipment used: Rolling walker (2 wheeled) Transfers: Sit to/from [facility] Sit to Stand: Min guard Stand pivot transfers: Min assist       General transfer comment: close guard for safety;r.  Pt did not have LOB but was unsteady and RLE lagged   Ambulation/Gait Ambulation/Gait assistance: Min guard Ambulation Distance (Feet): 400 Feet Assistive device: Rolling walker (2 wheeled) Gait Pattern/deviations: Step-through pattern        Stairs            Wheelchair Mobility    Modified Rankin (Stroke Patients Only)       Balance                                             Pertinent Vitals/Pain Pain Assessment: No/denies pain Pain Score: 8  Pain Location:  back Pain Intervention(s): Limited activity within patient's tolerance;Monitored during session;Repositioned;Patient requesting pain meds-RN notified    Home Living Family/patient expects to be discharged to:: Private residence Living Arrangements: Alone Available Help at Discharge: Family Type of Home: Apartment Home Access: Elevator     Home Layout: One level Home Equipment: None Additional Comments: pt states daugther comes every day    Prior Function Level of Independence: Independent         Comments: daughter checks on pt daily; she takes him grocery shopping     Hand Dominance   Dominant Hand: Right    Extremity/Trunk Assessment   Upper Extremity Assessment Upper Extremity Assessment: Defer to OT evaluation    Lower Extremity Assessment Lower Extremity Assessment: Generalized weakness    Cervical / Trunk Assessment Cervical / Trunk Assessment: Normal  Communication   Communication:  (dysarthric)  Cognition Arousal/Alertness: Awake/alert Behavior During Therapy: WFL for tasks assessed/performed Overall Cognitive Status: Within Functional Limits for tasks assessed                                 General Comments: pt states he does not have h/o CVA.  Cognition mostly WFLs      General Comments      Exercises     Assessment/Plan  PT Assessment Patient needs continued PT services  PT Problem List Decreased strength;Decreased activity tolerance;Decreased balance;Decreased mobility;Decreased knowledge of precautions;Decreased knowledge of use of DME;Decreased safety awareness       PT Treatment Interventions DME instruction;Gait training;Functional mobility training    PT Goals (Current goals can be found in the Care Plan section)  Acute Rehab PT Goals Patient Stated Goal: home PT Goal Formulation: With patient Time For Goal Achievement: 05/21/17 Potential to Achieve Goals: Good    Frequency Min 3X/week   Barriers to discharge         Co-evaluation               AM-PAC PT "6 Clicks" Daily Activity  Outcome Measure Difficulty turning over in bed (including adjusting bedclothes, sheets and blankets)?: A Little Difficulty moving from lying on back to sitting on the side of the bed? : A Little Difficulty sitting down on and standing up from a chair with arms (e.g., wheelchair, bedside commode, etc,.)?: Total Help needed moving to and from a bed to chair (including a wheelchair)?: Total Help needed walking in hospital room?: Total Help needed climbing 3-5 steps with a railing? : Total 6 Click Score: 10    End of Session   Activity Tolerance: Patient tolerated treatment well Patient left: in bed;with call bell/phone within reach;with bed alarm set Nurse Communication: Mobility status PT Visit Diagnosis: Difficulty in walking, not elsewhere classified (R26.2)    Time: 4656-8127 PT Time Calculation (min) (ACUTE ONLY): 13 min   Charges:   PT Evaluation $PT Eval Low Complexity: 1 Procedure     PT G CodesTresa Endo PT [phone]   Claretha Cooper 05/07/2017, 3:15 PM

## 2017-05-07 NOTE — Evaluation (Signed)
Occupational Therapy Evaluation Patient Details Name: [name] [name] MRN: [id number] DOB: April 19, 1946 Today's Date: 05/07/2017    History of Present Illness pt was admitted for DKA.  PMH:  CVA, DM   Clinical Impression   This 71 year old man was admitted for the above.  He was independent with adls at baseline, and currently needs min guard to min A.  Goals in acute are for mod I level    Follow Up Recommendations  Home health OT;SNF (vs.  )    Equipment Recommendations  None recommended by OT    Recommendations for Other Services       Precautions / Restrictions Precautions Precautions: Fall Restrictions Weight Bearing Restrictions: No      Mobility Bed Mobility Overal bed mobility: Needs Assistance Bed Mobility: Supine to Sit;Sit to Supine     Supine to sit: Min guard Sit to supine: Min assist   General bed mobility comments: light assist for legs for back to bed.  Changed technique for back to bed due to back pain  Transfers Overall transfer level: Needs assistance Equipment used: None Transfers: Sit to/from [facility] Sit to Stand: Min guard Stand pivot transfers: Min assist       General transfer comment: close guard for safety; light steadying assistance during transfer.  Pt did not have LOB but was unsteady and RLE lagged     Balance                                           ADL either performed or assessed with clinical judgement   ADL Overall ADL's : Needs assistance/impaired Eating/Feeding: Independent   Grooming: Set up;Sitting   Upper Body Bathing: Set up;Sitting   Lower Body Bathing: Min guard;Sit to/from stand   Upper Body Dressing : Set up;Sitting   Lower Body Dressing: Min guard;Sit to/from stand   Toilet Transfer: Minimal assistance;Stand-pivot (to chair without device)             General ADL Comments: pt c/o back pain with movement. States he had a fall at home, but did I didn't see this  documented.  He does not use AD; min A for steadying for spt.      Vision         Perception     Praxis      Pertinent Vitals/Pain Pain Assessment: 0-10 Pain Score: 8  Pain Location: back Pain Intervention(s): Limited activity within patient's tolerance;Monitored during session;Repositioned;Patient requesting pain meds-RN notified     Hand Dominance     Extremity/Trunk Assessment Upper Extremity Assessment Upper Extremity Assessment: Overall WFL for tasks assessed (R shoulder to 140 abduction)           Communication Communication Communication:  (dysarthric)   Cognition Arousal/Alertness: Awake/alert Behavior During Therapy: WFL for tasks assessed/performed                                   General Comments: pt states he does not have h/o CVA.  Cognition mostly WFLs   General Comments       Exercises     Shoulder Instructions      Home Living Family/patient expects to be discharged to:: Private residence Living Arrangements: Alone Available Help at Discharge: Family  Bathroom Shower/Tub: Occupational psychologist: Standard     Home Equipment: None          Prior Functioning/Environment Level of Independence: Independent        Comments: daughter checks on pt daily; she takes him grocery shopping        OT Problem List: Decreased strength;Decreased activity tolerance;Impaired balance (sitting and/or standing);Pain      OT Treatment/Interventions: Self-care/ADL training;DME and/or AE instruction;Therapeutic activities;Balance training;Patient/family education    OT Goals(Current goals can be found in the care plan section) Acute Rehab OT Goals Patient Stated Goal: home OT Goal Formulation: With patient Time For Goal Achievement: 05/14/17 Potential to Achieve Goals: Good ADL Goals Pt Will Transfer to Toilet: with modified independence;ambulating;regular height toilet Additional ADL Goal #1: pt will  gather clothes at mod I level and complete ADL without supervision  OT Frequency: Min 2X/week   Barriers to D/C:            Co-evaluation              AM-PAC PT "6 Clicks" Daily Activity     Outcome Measure Help from another person eating meals?: None Help from another person taking care of personal grooming?: A Little Help from another person toileting, which includes using toliet, bedpan, or urinal?: A Little Help from another person bathing (including washing, rinsing, drying)?: A Little Help from another person to put on and taking off regular upper body clothing?: A Little Help from another person to put on and taking off regular lower body clothing?: A Little 6 Click Score: 19   End of Session    Activity Tolerance: Patient limited by pain Patient left: in bed;with call bell/phone within reach;with bed alarm set  OT Visit Diagnosis: Unsteadiness on feet (R26.81)                Time: 8341-9622 OT Time Calculation (min): 21 min Charges:  OT General Charges $OT Visit: 1 Procedure OT Evaluation $OT Eval Low Complexity: 1 Procedure G-Codes:     [place], OTR/L [phone] 05/07/2017  Shazia Mitchener 05/07/2017, 12:16 PM

## 2017-05-08 LAB — BASIC METABOLIC PANEL
Anion gap: 9 (ref 5–15)
BUN: 37 mg/dL — ABNORMAL HIGH (ref 6–20)
CO2: 23 mmol/L (ref 22–32)
Calcium: 9.1 mg/dL (ref 8.9–10.3)
Chloride: 100 mmol/L — ABNORMAL LOW (ref 101–111)
Creatinine, Ser: 1.44 mg/dL — ABNORMAL HIGH (ref 0.61–1.24)
GFR calc Af Amer: 55 mL/min — ABNORMAL LOW (ref >60)
GFR calc non Af Amer: 47 mL/min — ABNORMAL LOW (ref >60)
Glucose, Bld: 369 mg/dL — ABNORMAL HIGH (ref 65–99)
Potassium: 5.7 mmol/L — ABNORMAL HIGH (ref 3.5–5.1)
Sodium: 132 mmol/L — ABNORMAL LOW (ref 135–145)

## 2017-05-08 LAB — CBC
HCT: 30.8 % — ABNORMAL LOW (ref 39.0–52.0)
Hemoglobin: 10.4 g/dL — ABNORMAL LOW (ref 13.0–17.0)
MCH: 29.2 pg (ref 26.0–34.0)
MCHC: 33.8 g/dL (ref 30.0–36.0)
MCV: 86.5 fL (ref 78.0–100.0)
Platelets: 157 10*3/uL (ref 150–400)
RBC: 3.56 MIL/uL — ABNORMAL LOW (ref 4.22–5.81)
RDW: 13.4 % (ref 11.5–15.5)
WBC: 6.7 10*3/uL (ref 4.0–10.5)

## 2017-05-08 LAB — GLUCOSE, CAPILLARY
Glucose-Capillary: 188 mg/dL — ABNORMAL HIGH (ref 65–99)
Glucose-Capillary: 197 mg/dL — ABNORMAL HIGH (ref 65–99)

## 2017-05-08 LAB — POTASSIUM: Potassium: 4.6 mmol/L (ref 3.5–5.1)

## 2017-05-08 MED ORDER — INSULIN DETEMIR 100 UNIT/ML FLEXPEN: 7.0000 [IU] | PEN_INJECTOR | Freq: Every day | SUBCUTANEOUS | 0 refills | Status: DC

## 2017-05-08 MED ORDER — GLUCERNA SHAKE PO LIQD
237.0000 mL | Freq: Two times a day (BID) | ORAL | Status: DC
  Administered 2017-05-08: 237 mL via ORAL
  Filled 2017-05-08: qty 237

## 2017-05-08 MED ORDER — BLOOD GLUCOSE METER KIT: PACK | 0 refills | Status: DC

## 2017-05-08 MED ORDER — INSULIN NPH ISOPHANE & REGULAR (70-30) 100 UNIT/ML ~~LOC~~ SUSP: 5.0000 [IU] | Freq: Two times a day (BID) | SUBCUTANEOUS | 0 refills | Status: DC

## 2017-05-08 MED ORDER — "INSULIN SYRINGE 31G X 5/16"" 0.3 ML MISC": 5.0000 [IU] | Freq: Two times a day (BID) | 0 refills | Status: DC

## 2017-05-08 MED ORDER — INSULIN PEN NEEDLE 31G X 5 MM MISC: 0 refills | Status: DC

## 2017-05-08 MED ORDER — AMOXICILLIN-POT CLAVULANATE 875-125 MG PO TABS: 1.0000 | ORAL_TABLET | Freq: Two times a day (BID) | ORAL | 0 refills | Status: AC

## 2017-05-08 MED ORDER — AMLODIPINE BESYLATE 10 MG PO TABS: 10.0000 mg | ORAL_TABLET | Freq: Every day | ORAL | 0 refills | Status: DC

## 2017-05-08 MED ORDER — INSULIN ASPART 100 UNIT/ML FLEXPEN: PEN_INJECTOR | SUBCUTANEOUS | 0 refills | Status: DC

## 2017-05-08 NOTE — Progress Notes (Signed)
Initial Nutrition Assessment  DOCUMENTATION CODES:   Not applicable  INTERVENTION:    Glucerna Shake po BID, each supplement provides 220 kcal and 10 grams of protein  NUTRITION DIAGNOSIS:   Increased nutrient needs related to acute illness, chronic illness as evidenced by estimated needs  GOAL:   Patient will meet greater than or equal to 90% of their needs  MONITOR:   PO intake, Supplement acceptance, Labs, Weight trends, Skin, I & O's  REASON FOR ASSESSMENT:   Consult Assessment of nutrition requirement/status  ASSESSMENT:   71 y.o. Male with PMH of diverticulitis previous DKA comes in with polyuria and polydipsia. He has not been getting his insulin regularly due to insurance.   RD spoke with pt at bedside. Reports a good appetite. PO intake 75% per flowsheet records. Endorses a 16-17 lb weight loss x 3 months, however, readings do not reflect his.  Medications reviewed and include Vitamin B-12. Labs reviewed. Na 132 (L). K 5.7 (L). CBG's [phone].  Nutrition focused physical exam completed.  No muscle or subcutaneous fat depletion noticed.  Diet Order:  Diet Carb Modified Fluid consistency: Thin; Room service appropriate? Yes  Skin:  Reviewed, no issues  Last BM:  7/26  Height:   Ht Readings from Last 1 Encounters:  05/04/17 5' 9.5" (1.765 m)    Weight:   Wt Readings from Last 1 Encounters:  05/06/17 146 lb 2.6 oz (66.3 kg)   Wt Readings from Last 10 Encounters:  05/06/17 146 lb 2.6 oz (66.3 kg)  04/27/17 135 lb (61.2 kg)  03/29/17 137 lb 1.6 oz (62.2 kg)  02/01/17 132 lb (59.9 kg)  01/29/17 131 lb (59.4 kg)  01/16/17 127 lb (57.6 kg)  01/02/17 130 lb (59 kg)  12/29/16 146 lb 2.6 oz (66.3 kg)  12/16/16 132 lb 9.6 oz (60.1 kg)  10/03/16 155 lb 13.8 oz (70.7 kg)   Ideal Body Weight:  72.7 kg  BMI:  Body mass index is 21.28 kg/m.  Estimated Nutritional Needs:   Kcal:  1800-2000  Protein:  90-100 gm  Fluid:  1.8-2.0 L  EDUCATION  NEEDS:   No education needs identified at this time  Arthur Holms, RD, LDN Pager #: [phone] After-Hours Pager #: [phone]

## 2017-05-08 NOTE — Progress Notes (Signed)
MD notified re: K level of 5.7, new order received.

## 2017-05-08 NOTE — Progress Notes (Signed)
Spoke with patient at bedside. States he has insulin pens at home. States he has all diabetes supplies at home. Per chart this was not the case on admission. Patient says he has insurance coverage, states he gets his meds filled at [facility]. [place], their records indicate his coverage lapsed in mid-July. Contacted the daughter, no answer so left a message. Per records has not seen PCP since April, contacted Patient Kell. Appointment scheduled and place in AVS. D/c on hold until we can decipher med coverage and what he can afford.

## 2017-05-08 NOTE — Progress Notes (Signed)
Occupational Therapy Treatment Patient Details Name: [name] [name] MRN: [id number] DOB: 26-Nov-1945 Today's Date: 05/08/2017    History of present illness pt was admitted for DKA.  PMH:  CVA, DM   OT comments  Assisted pt with dressing in preparation for discharge home.  Pt steadier with transfers; had difficulty with shoes this session  Follow Up Recommendations  Home health OT;Supervision/Assistance - 24 hour    Equipment Recommendations  None recommended by OT    Recommendations for Other Services      Precautions / Restrictions Precautions Precautions: Fall Restrictions Weight Bearing Restrictions: No       Mobility Bed Mobility Overal bed mobility: Independent                Transfers Overall transfer level: Needs assistance Equipment used: Rolling walker (2 wheeled) Transfers: Sit to/from Stand Sit to Stand: Supervision Stand pivot transfers: Supervision            Balance Overall balance assessment: Needs assistance         Standing balance support: During functional activity;Bilateral upper extremity supported Standing balance-Leahy Scale: Fair                             ADL either performed or assessed with clinical judgement   ADL                   Upper Body Dressing : Set up;Sitting   Lower Body Dressing: Minimal assistance;Sit to/from stand   Toilet Transfer: Supervision/safety;Stand-pivot (to chair)             General ADL Comments: pt is planning discharge today.  He donned clothing:  had difficulty with shoes over hospital socks.  Back hurts when he bends forward.  He states his daughter can assist him.       Vision       Perception     Praxis      Cognition Arousal/Alertness: Awake/alert Behavior During Therapy: WFL for tasks assessed/performed Overall Cognitive Status: Within Functional Limits for tasks assessed                                          Exercises     Shoulder  Instructions       General Comments      Pertinent Vitals/ Pain       Pain Assessment: No/denies pain  Home Living                                          Prior Functioning/Environment              Frequency           Progress Toward Goals  OT Goals(current goals can now be found in the care plan section)  Progress towards OT goals: Progressing toward goals     Plan      Co-evaluation                 AM-PAC PT "6 Clicks" Daily Activity     Outcome Measure   Help from another person eating meals?: None Help from another person taking care of personal grooming?: A Little Help from another person toileting, which includes using toliet, bedpan, or urinal?:  A Little Help from another person bathing (including washing, rinsing, drying)?: A Little Help from another person to put on and taking off regular upper body clothing?: A Little Help from another person to put on and taking off regular lower body clothing?: A Little 6 Click Score: 19    End of Session    OT Visit Diagnosis: Unsteadiness on feet (R26.81)   Activity Tolerance Patient tolerated treatment well   Patient Left in chair;with call bell/phone within reach;with chair alarm set   Nurse Communication          Time: 4388-8757 OT Time Calculation (min): 15 min  Charges: OT General Charges $OT Visit: 1 Procedure OT Treatments $Self Care/Home Management : 8-22 mins  Lesle Chris, OTR/L [phone] 05/08/2017   [place] 05/08/2017, 2:01 PM

## 2017-05-08 NOTE — Discharge Summary (Signed)
Physician Discharge Summary  [name] [name] MVE:[id number] DOB: 04-11-1946 DOA: 05/03/2017  PCP: [name] Jun, [name]  Admit date: 05/03/2017 Discharge date: 05/08/2017  Time spent: 65 minutes  Recommendations for Outpatient Follow-up:  1. Follow-up with [name] Jun, [name] 05/12/2017. Patient only diabetes reassessed. Patient's insulin was changed from Levemir to 70/30 5 units twice daily as it was noted that patient's insurance denied his refills on Levemir. Patient will need basic metabolic profile done to follow-up on electrolytes and renal function.   Discharge Diagnoses:  Active Problems:   Uncontrolled type 2 diabetes mellitus with diabetic nephropathy, with long-term current use of insulin ([name])   AKI (acute kidney injury) ([place])   Dehydration   Urinary tract infection without hematuria   DKA (diabetic ketoacidoses) ([place])   Discharge Condition: Stable and improved  Diet recommendation: carb modified  Filed Weights   05/04/17 0315 05/05/17 0522 05/06/17 0600  Weight: 64.9 kg (143 lb 1.3 oz) 67.2 kg (148 lb 2.4 oz) 66.3 kg (146 lb 2.6 oz)    History of present illness:  Per Dr [name] [name] is a 71 y.o. male with medical history significant for but not limited to diverticulitis with previous DKA and hyperosmolar states presenting with generalized weakness and polyuria for several days associated with region of "high" on his glucometer. He has not been able to take his insulin due to insurance problems. Chest pain, fever or chills.  ED Course: At the ED patient was hemodynamically stable but lethargic. He was started on IV fluid bolus with insulin drip and is admitted for DKA.  Hospital Course:  DKA (diabetic ketoacidoses) ([name])/Uncontrolled type 2 diabetes mellitus with diabetic nephropathy, with long-term current use of insulin ([place])  Patient was admitted in DKA with a blood glucose of 928, bicarbonate of 7, anion gap of 24. Etiology likely secondary to medical  noncompliance versus inability to get medication secondary to insurance issues in the setting of urinary tract infection. Patient was  admitted to the stepdown unit and placed on glucose stabilizer on admission however continued to be in DKA with gap subsequently closing and bicarbonate now approximately 20. Patient  tr was subsequently transitioned off of insulin drip and placed on Levemir 5 units twice daily which was subsequently adjusted to 70 units at bedtime. Patient was also maintained on a sliding scale insulin.   Hemoglobin A1c is 11.6.  patient was to be discharged home on regimen of Levemir at 7 units at bedtime however was noted per case manager that patient's insulin prescription got denied by insurance at the pharmacy and a such patient's Levemir has now been changed to Novolin 70/30   5 units twice daily. Patient will be discharged home with home health RN to monitor patient's compliance and insulin regimen and adjust insulin in consultation with patient's PCP. Patient will be discharged home in stable and improved condition.   Acute renal failure: Baseline creatinine is less than 1, admission creatinine was 4.66. Likely prerenal in etiology and secondary to dehydration and DKA. Renal function improved daily with hydration such that by day of discharge creatinine was down to 1.44. Outpatient follow-up.   Klebsiella pneumonia Urinary tract infection without hematuria Patient noted on admission to have a UTI. Patient initially placed empirically on IV Rocephin while urine cultures were pending. Patient received 2 days of IV Rocephin. Patient still with a leukocytosis. Patient afebrile. Urine cultures growing Klebsiella pneumonia resistant to ampicillin however sensitive to ampicillin sulbactam, cephalosporins, quinolones, Bactrim. Changed IV Rocephin to  oral Augmentin to complete course of antibiotic treatment.  Normocytic anemia. Follow-up with PCP as an  outpatient.   Procedures:  None  Consultations:  Diabetes coordinator  Discharge Exam: Vitals:   05/08/17 0744 05/08/17 0925  BP: (!) 148/75 123/62  Pulse: 66   Resp:    Temp:      General: NAD Cardiovascular: RRR Respiratory: CTAB  Discharge Instructions   Discharge Instructions    Diet Carb Modified    Complete by:  As directed    Increase activity slowly    Complete by:  As directed      Current Discharge Medication List    START taking these medications   Details  amoxicillin-clavulanate (AUGMENTIN) 875-125 MG tablet Take 1 tablet by mouth every 12 (twelve) hours. Take for 3 days then stop. Qty: 6 tablet, Refills: 0    !! blood glucose meter kit and supplies Dispense based on patient and insurance preference. Use up to four times daily as directed. (FOR ICD-9 250.00, 250.01). Qty: 1 each, Refills: 0    insulin NPH-regular Human (NOVOLIN 70/30) (70-30) 100 UNIT/ML injection Inject 5 Units into the skin 2 (two) times daily with a meal. Qty: 10 mL, Refills: 0    Insulin Syringe-Needle U-100 (INSULIN SYRINGE .3CC/31GX5/16") 31G X 5/16" 0.3 ML MISC 5 Units by Does not apply route 2 (two) times daily. Qty: 100 each, Refills: 0     !! - Potential duplicate medications found. Please discuss with provider.    CONTINUE these medications which have CHANGED   Details  amLODipine (NORVASC) 10 MG tablet Take 1 tablet (10 mg total) by mouth daily. Qty: 30 tablet, Refills: 0      CONTINUE these medications which have NOT CHANGED   Details  aspirin 81 MG EC tablet Take 1 tablet (81 mg total) by mouth daily. Qty: 90 tablet, Refills: 3    !! blood glucose meter kit and supplies Dispense based on patient and insurance preference. Use up to four times daily as directed. (FOR ICD-9 250.00, 250.01). Qty: 1 each, Refills: 0    carvedilol (COREG) 6.25 MG tablet Take 1 tablet (6.25 mg total) by mouth 2 (two) times daily with a meal. Qty: 60 tablet, Refills: 1     FLUoxetine (PROZAC) 40 MG capsule Take 1 capsule (40 mg total) by mouth daily. Qty: 90 capsule, Refills: 3    vitamin B-12 (CYANOCOBALAMIN) 1000 MCG tablet Take 1 tablet (1,000 mcg total) by mouth daily. Qty: 30 tablet, Refills: 1    acetaminophen (TYLENOL) 325 MG tablet Take 2 tablets (650 mg total) by mouth every 6 (six) hours as needed for mild pain (or Fever >/= 101). Qty: 30 tablet, Refills: 0    feeding supplement, GLUCERNA SHAKE, (GLUCERNA SHAKE) LIQD Take 237 mLs by mouth 2 (two) times daily between meals. Qty: 30 Can, Refills: 0    HYDROcodone-acetaminophen (NORCO/VICODIN) 5-325 MG tablet Take 1 tablet by mouth every 6 (six) hours as needed for moderate pain or severe pain. Qty: 10 tablet, Refills: 0     !! - Potential duplicate medications found. Please discuss with provider.    STOP taking these medications     insulin aspart (NOVOLOG FLEXPEN) 100 UNIT/ML FlexPen      Insulin Detemir (LEVEMIR) 100 UNIT/ML Pen      Insulin Pen Needle 31G X 5 MM MISC      insulin aspart (NOVOLOG FLEXPEN) 100 UNIT/ML FlexPen        No Known Allergies Follow-up Information  [name] Jun, [name]. Go on 05/12/2017.   Specialty:  Family Medicine Why:  appointment at 9:30 am Contact information: [place] [id number] [phone]            The results of significant diagnostics from this hospitalization (including imaging, microbiology, ancillary and laboratory) are listed below for reference.    Significant Diagnostic Studies: Dg Chest Portable 1 View  Result Date: 04/27/2017 CLINICAL DATA:  Hyperglycemia EXAM: PORTABLE CHEST 1 VIEW COMPARISON:  01/29/2017 FINDINGS: The heart size and mediastinal contours are within normal limits. Both lungs are clear. The visualized skeletal structures are unremarkable. IMPRESSION: No active disease. Electronically Signed   By: Ashley Royalty M.D.   On: 04/27/2017 19:50    Microbiology: Recent Results (from the past 240  hour(s))  Culture, Urine     Status: Abnormal   Collection Time: 05/03/17  8:19 AM  Result Value Ref Range Status   Specimen Description URINE, CATHETERIZED  Final   Special Requests NONE  Final   Culture >=100,000 COLONIES/mL KLEBSIELLA PNEUMONIAE (A)  Final   Report Status 05/05/2017 FINAL  Final   Organism ID, Bacteria KLEBSIELLA PNEUMONIAE (A)  Final      Susceptibility   Klebsiella pneumoniae - MIC*    AMPICILLIN RESISTANT Resistant     CEFAZOLIN <=4 SENSITIVE Sensitive     CEFTRIAXONE <=1 SENSITIVE Sensitive     CIPROFLOXACIN <=0.25 SENSITIVE Sensitive     GENTAMICIN <=1 SENSITIVE Sensitive     IMIPENEM <=0.25 SENSITIVE Sensitive     NITROFURANTOIN <=16 SENSITIVE Sensitive     TRIMETH/SULFA <=20 SENSITIVE Sensitive     AMPICILLIN/SULBACTAM 4 SENSITIVE Sensitive     PIP/TAZO <=4 SENSITIVE Sensitive     Extended ESBL NEGATIVE Sensitive     * >=100,000 COLONIES/mL KLEBSIELLA PNEUMONIAE  MRSA PCR Screening     Status: None   Collection Time: 05/03/17 12:37 PM  Result Value Ref Range Status   MRSA by PCR NEGATIVE NEGATIVE Final    Comment:        The GeneXpert MRSA Assay (FDA approved for NASAL specimens only), is one component of a comprehensive MRSA colonization surveillance program. It is not intended to diagnose MRSA infection nor to guide or monitor treatment for MRSA infections.      Labs: Basic Metabolic Panel:  Recent Labs Lab 05/03/17 1247  05/05/17 2042 05/06/17 0038 05/07/17 0426 05/07/17 1806 05/08/17 0351 05/08/17 0855  NA 135  < > 135 136 135 133* 132*  --   K 4.1  < > 4.4 4.0 4.0 4.7 5.7* 4.6  CL 103  < > 109 108 104 100* 100*  --   CO2 8*  < > 19* 20* '23 22 23  ' --   GLUCOSE 490*  < > 275* 110* 107* 475* 369*  --   BUN 79*  < > 24* 20 16 28* 37*  --   CREATININE 4.66*  < > 1.52* 1.38* 1.21 1.59* 1.44*  --   CALCIUM 8.5*  < > 8.2* 8.3* 8.9 9.0 9.1  --   MG 2.7*  --   --   --   --   --   --   --   PHOS 6.2*  --   --   --   --   --   --   --    < > = values in this interval not displayed. Liver Function Tests: No results for input(s): AST, ALT, ALKPHOS, BILITOT, PROT, ALBUMIN in  the last 168 hours. No results for input(s): LIPASE, AMYLASE in the last 168 hours. No results for input(s): AMMONIA in the last 168 hours. CBC:  Recent Labs Lab 05/03/17 0847 05/03/17 1247 05/07/17 0426 05/08/17 0351  WBC 15.1* 14.3* 6.0 6.7  HGB 10.6* 10.7* 10.8* 10.4*  HCT 32.2* 31.1* 30.6* 30.8*  MCV 93.6 88.6 85.0 86.5  PLT 264 252 135* 157   Cardiac Enzymes: No results for input(s): CKTOTAL, CKMB, CKMBINDEX, TROPONINI in the last 168 hours. BNP: BNP (last 3 results) No results for input(s): BNP in the last 8760 hours.  ProBNP (last 3 results) No results for input(s): PROBNP in the last 8760 hours.  CBG:  Recent Labs Lab 05/07/17 1138 05/07/17 1711 05/07/17 2128 05/08/17 0717 05/08/17 1202  GLUCAP 350* 425* 162* 188* 197*       Signed:  Persephanie Laatsch MD.  [facility] 05/08/2017, 12:53 PM  [id number]

## 2017-05-08 NOTE — Progress Notes (Signed)
Pt discharged home with sister [name] in stable condition. Discharge instructions and scripts given. Insulin scripts given to sister, [name], per her request to "ensure they get filled"  No immediate questions or concerns at this time. Pt verbalized understanding of insulin administration via teach back. No immediate questions or concerns at this time.

## 2017-05-08 NOTE — Progress Notes (Signed)
Physical Therapy Treatment Patient Details Name: [name] [name] MRN: [id number] DOB: 20-Nov-1945 Today's Date: 05/08/2017    History of Present Illness pt was admitted for DKA.  PMH:  CVA, DM    PT Comments    The patient demonstrates improved bed mobility and ambulation with RW is steady, no balance losses noted. Plans DC today.    Follow Up Recommendations  Home health PT;Supervision/Assistance - 24 hour     Equipment Recommendations  None recommended by PT (pt. reports the RW in his room belongs to patient.)    Recommendations for Other Services       Precautions / Restrictions Precautions Precautions: Fall    Mobility  Bed Mobility Overal bed mobility: Independent                Transfers Overall transfer level: Needs assistance Equipment used: Rolling walker (2 wheeled) Transfers: Sit to/from Stand Sit to Stand: Supervision            Ambulation/Gait Ambulation/Gait assistance: Supervision Ambulation Distance (Feet): 400 Feet Assistive device: Rolling walker (2 wheeled) Gait Pattern/deviations: Step-through pattern   Gait velocity interpretation: Below normal speed for age/gender General Gait Details: gait is steady with RW.    Stairs            Wheelchair Mobility    Modified Rankin (Stroke Patients Only)       Balance Overall balance assessment: Needs assistance         Standing balance support: During functional activity;Bilateral upper extremity supported Standing balance-Leahy Scale: Fair                              Cognition Arousal/Alertness: Awake/alert                                            Exercises      General Comments        Pertinent Vitals/Pain Pain Assessment: No/denies pain    Home Living                      Prior Function            PT Goals (current goals can now be found in the care plan section) Progress towards PT goals: Progressing toward  goals    Frequency    Min 3X/week      PT Plan Current plan remains appropriate    Co-evaluation              AM-PAC PT "6 Clicks" Daily Activity  Outcome Measure  Difficulty turning over in bed (including adjusting bedclothes, sheets and blankets)?: None Difficulty moving from lying on back to sitting on the side of the bed? : None Difficulty sitting down on and standing up from a chair with arms (e.g., wheelchair, bedside commode, etc,.)?: Total Help needed moving to and from a bed to chair (including a wheelchair)?: A Little Help needed walking in hospital room?: A Little Help needed climbing 3-5 steps with a railing? : Total 6 Click Score: 16    End of Session   Activity Tolerance: Patient tolerated treatment well Patient left: in bed;with call bell/phone within reach;with bed alarm set Nurse Communication: Mobility status PT Visit Diagnosis: Difficulty in walking, not elsewhere classified (R26.2)     Time: 6803-2122 PT Time Calculation (min) (ACUTE  ONLY): 12 min  Charges:  $Gait Training: 8-22 mins                    G CodesTresa Endo PT [phone]   Claretha Cooper 05/08/2017, 1:38 PM

## 2017-05-10 DIAGNOSIS — Z794 Long term (current) use of insulin: Secondary | ICD-10-CM | POA: Diagnosis not present

## 2017-05-10 DIAGNOSIS — I10 Essential (primary) hypertension: Secondary | ICD-10-CM | POA: Diagnosis not present

## 2017-05-10 DIAGNOSIS — Z8673 Personal history of transient ischemic attack (TIA), and cerebral infarction without residual deficits: Secondary | ICD-10-CM | POA: Diagnosis not present

## 2017-05-10 DIAGNOSIS — D509 Iron deficiency anemia, unspecified: Secondary | ICD-10-CM | POA: Diagnosis not present

## 2017-05-10 DIAGNOSIS — Z87440 Personal history of urinary (tract) infections: Secondary | ICD-10-CM | POA: Diagnosis not present

## 2017-05-10 DIAGNOSIS — Z7982 Long term (current) use of aspirin: Secondary | ICD-10-CM | POA: Diagnosis not present

## 2017-05-10 DIAGNOSIS — E1165 Type 2 diabetes mellitus with hyperglycemia: Secondary | ICD-10-CM | POA: Diagnosis not present

## 2017-05-11 ENCOUNTER — Telehealth: Payer: Self-pay | Admitting: Family Medicine

## 2017-05-11 DIAGNOSIS — Z7982 Long term (current) use of aspirin: Secondary | ICD-10-CM | POA: Diagnosis not present

## 2017-05-11 DIAGNOSIS — Z794 Long term (current) use of insulin: Secondary | ICD-10-CM | POA: Diagnosis not present

## 2017-05-11 DIAGNOSIS — I10 Essential (primary) hypertension: Secondary | ICD-10-CM | POA: Diagnosis not present

## 2017-05-11 DIAGNOSIS — E1165 Type 2 diabetes mellitus with hyperglycemia: Secondary | ICD-10-CM | POA: Diagnosis not present

## 2017-05-11 DIAGNOSIS — D509 Iron deficiency anemia, unspecified: Secondary | ICD-10-CM | POA: Diagnosis not present

## 2017-05-11 DIAGNOSIS — Z87440 Personal history of urinary (tract) infections: Secondary | ICD-10-CM | POA: Diagnosis not present

## 2017-05-11 NOTE — Telephone Encounter (Signed)
Diane with home health is needing to get verbal by Dr. Brigitte Pulse regarding OT 2 times a week for 1 week and 1 time a week for 3 weeks   Best number for diane [phone]

## 2017-05-11 NOTE — Telephone Encounter (Signed)
Verbal orders given  

## 2017-05-12 ENCOUNTER — Ambulatory Visit (INDEPENDENT_AMBULATORY_CARE_PROVIDER_SITE_OTHER): Payer: Medicare Other | Admitting: Family Medicine

## 2017-05-12 ENCOUNTER — Encounter: Payer: Self-pay | Admitting: Family Medicine

## 2017-05-12 VITALS — BP 110/68 | HR 60 | Temp 98.1°F | Resp 14 | Ht 69.5 in | Wt 138.8 lb

## 2017-05-12 DIAGNOSIS — E0865 Diabetes mellitus due to underlying condition with hyperglycemia: Secondary | ICD-10-CM | POA: Diagnosis not present

## 2017-05-12 DIAGNOSIS — N183 Chronic kidney disease, stage 3 unspecified: Secondary | ICD-10-CM

## 2017-05-12 DIAGNOSIS — E0822 Diabetes mellitus due to underlying condition with diabetic chronic kidney disease: Secondary | ICD-10-CM | POA: Diagnosis not present

## 2017-05-12 LAB — POCT URINALYSIS DIP (DEVICE)
Bilirubin Urine: NEGATIVE
Glucose, UA: NEGATIVE mg/dL
Hgb urine dipstick: NEGATIVE
Ketones, ur: NEGATIVE mg/dL
Leukocytes, UA: NEGATIVE
Nitrite: NEGATIVE
Protein, ur: 30 mg/dL — AB
Specific Gravity, Urine: 1.01 (ref 1.005–1.030)
Urobilinogen, UA: 0.2 mg/dL (ref 0.0–1.0)
pH: 7 (ref 5.0–8.0)

## 2017-05-12 LAB — GLUCOSE, CAPILLARY: Glucose-Capillary: 133 mg/dL — ABNORMAL HIGH (ref 65–99)

## 2017-05-12 MED ORDER — INSULIN GLARGINE 100 UNIT/ML SOLOSTAR PEN: 20.0000 [IU] | PEN_INJECTOR | Freq: Every day | SUBCUTANEOUS | 2 refills | Status: DC

## 2017-05-12 MED ORDER — TRAMADOL-ACETAMINOPHEN 37.5-325 MG PO TABS: 1.0000 | ORAL_TABLET | Freq: Three times a day (TID) | ORAL | 0 refills | Status: DC | PRN

## 2017-05-12 MED ORDER — INSULIN GLARGINE 100 UNIT/ML SOLOSTAR PEN: 20.0000 [IU] | PEN_INJECTOR | Freq: Two times a day (BID) | SUBCUTANEOUS | 2 refills | Status: DC

## 2017-05-12 MED ORDER — INSULIN ASPART 100 UNIT/ML FLEXPEN: 5.0000 [IU] | PEN_INJECTOR | Freq: Three times a day (TID) | SUBCUTANEOUS | 1 refills | Status: DC

## 2017-05-12 MED ORDER — INSULIN PEN NEEDLE 29G X 10MM MISC: 2 refills | Status: DC

## 2017-05-12 MED FILL — TRUEPLUS PEN NDL 31GX5/16": 31G X 8 MM | 30 days supply | Qty: 100 | Fill #0

## 2017-05-12 MED FILL — NOVOLOG FLEXPEN SYRINGE: 100 | 30 days supply | Qty: 6 | Fill #0

## 2017-05-12 MED FILL — LANTUS SOLOSTAR 100 UNITS/M: 100 | 30 days supply | Qty: 12 | Fill #0

## 2017-05-12 MED FILL — TRUEPLUS PEN NDL 31GX5/16: 31G X 8 MM | 30 days supply | Qty: 100 | Fill #0

## 2017-05-12 NOTE — Telephone Encounter (Signed)
Information updated with [facility]

## 2017-05-12 NOTE — Telephone Encounter (Signed)
Pt has never been seen here. It looks like he is being seen by Lavell Anchors over at [facility] so will pass message along.  Maudie Mercury - we gave initial authorization so if you disagree please cancel asap. I will not be able to sign any of his medicare authorization forms since I have never seen this pt so please have someone from your office call the [facility] to ensure all forms are faxed to your clinic.   Thanks so much! Harmon Pier

## 2017-05-12 NOTE — Patient Instructions (Addendum)
I am changing your diabetes regimen as follows:  Discontinue Novolin.   All medications are ready for pick-up at [facility].  Administer Lantus twice daily 20 units. Each a high protein snack after administration of medication.  Administer Novolog only prior to meals, 3 times daily for blood sugars greater than 150.  Continue check blood sugar and keep a log of readings.  I am referring you to nephrology and home health for medication management.   Diabetes Mellitus and Food It is important for you to manage your blood sugar (glucose) level. Your blood glucose level can be greatly affected by what you eat. Eating healthier foods in the appropriate amounts throughout the day at about the same time each day will help you control your blood glucose level. It can also help slow or prevent worsening of your diabetes mellitus. Healthy eating may even help you improve the level of your blood pressure and reach or maintain a healthy weight. General recommendations for healthful eating and cooking habits include:  Eating meals and snacks regularly. Avoid going long periods of time without eating to lose weight.  Eating a diet that consists mainly of plant-based foods, such as fruits, vegetables, nuts, legumes, and whole grains.  Using low-heat cooking methods, such as baking, instead of high-heat cooking methods, such as deep frying.  Work with your dietitian to make sure you understand how to use the Nutrition Facts information on food labels. How can food affect me? Carbohydrates Carbohydrates affect your blood glucose level more than any other type of food. Your dietitian will help you determine how many carbohydrates to eat at each meal and teach you how to count carbohydrates. Counting carbohydrates is important to keep your blood glucose at a healthy level, especially if you are using insulin or taking certain medicines for diabetes mellitus. Alcohol Alcohol can cause  sudden decreases in blood glucose (hypoglycemia), especially if you use insulin or take certain medicines for diabetes mellitus. Hypoglycemia can be a life-threatening condition. Symptoms of hypoglycemia (sleepiness, dizziness, and disorientation) are similar to symptoms of having too much alcohol. If your health care provider has given you approval to drink alcohol, do so in moderation and use the following guidelines:  Women should not have more than one drink per day, and men should not have more than two drinks per day. One drink is equal to: ? 12 oz of beer. ? 5 oz of wine. ? 1 oz of hard liquor.  Do not drink on an empty stomach.  Keep yourself hydrated. Have water, diet soda, or unsweetened iced tea.  Regular soda, juice, and other mixers might contain a lot of carbohydrates and should be counted.  What foods are not recommended? As you make food choices, it is important to remember that all foods are not the same. Some foods have fewer nutrients per serving than other foods, even though they might have the same number of calories or carbohydrates. It is difficult to get your body what it needs when you eat foods with fewer nutrients. Examples of foods that you should avoid that are high in calories and carbohydrates but low in nutrients include:  Trans fats (most processed foods list trans fats on the Nutrition Facts label).  Regular soda.  Juice.  Candy.  Sweets, such as cake, pie, doughnuts, and cookies.  Fried foods.  What foods can I eat? Eat nutrient-rich foods, which will nourish your body and keep you healthy. The food you should eat also will  depend on several factors, including:  The calories you need.  The medicines you take.  Your weight.  Your blood glucose level.  Your blood pressure level.  Your cholesterol level.  You should eat a variety of foods, including:  Protein. ? Lean cuts of meat. ? Proteins low in saturated fats, such as fish, egg  whites, and beans. Avoid processed meats.  Fruits and vegetables. ? Fruits and vegetables that may help control blood glucose levels, such as apples, mangoes, and yams.  Dairy products. ? Choose fat-free or low-fat dairy products, such as milk, yogurt, and cheese.  Grains, bread, pasta, and rice. ? Choose whole grain products, such as multigrain bread, whole oats, and brown rice. These foods may help control blood pressure.  Fats. ? Foods containing healthful fats, such as nuts, avocado, olive oil, canola oil, and fish.  Does everyone with diabetes mellitus have the same meal plan? Because every person with diabetes mellitus is different, there is not one meal plan that works for everyone. It is very important that you meet with a dietitian who will help you create a meal plan that is just right for you. This information is not intended to replace advice given to you by your health care provider. Make sure you discuss any questions you have with your health care provider. Document Released: 06/26/2005 Document Revised: 03/06/2016 Document Reviewed: 08/26/2013 Elsevier Interactive Patient Education  2017 [employer].

## 2017-05-12 NOTE — Progress Notes (Signed)
Patient ID: [name] [name], male    DOB: 1946/04/30, 71 y.o.   MRN: [id number]  PCP: [name], [name]  Chief Complaint  Patient presents with  . Hospitalization Follow-up    Subjective:  HPI  [name] [name] is a 71 y.o. male presents for a hospital follow-up.  Medical problems include uncontrolled diabetes, impaired mobility, medication non-compliance, and current smoker. [name] [name] has had 7 hospital admission in less than 6 months due to hyperglycemia. He is accompanied [name] [name] with his daughter and caregiver. [name] [name] was recently hospitalized for DKA after home glucometer readings at read "high" for several days. Upon arrival at the ED, [name] [name] was lethargic. He was treated with fluid rehydration and placed on an insulin drip. He was found to have an UTI and was experiencing AKI with a creatinine 4.66, however renal function improved to 1.44 upon discharge. [name] [name], [name] [name] reports that he is not experiencing problems or frequency of urination. Due to multiple admission to the hospital he is now receiving, home health services providing physical therapy and occupational therapy services. His daughter reports that his blood pressures have improved as the machine is not reading "high" despite readings continuing to range on 300-400's.  Daughter reports that [name] [name] is only receiving insulin when she stops by his home to administer it. She reports that he has a poor diet and mostly drinks shakes. [name] [name]'s current hemoglobin A1C 11.6. Daughter reports that his highest blood sugar reading since leaving the hospital 537.  Social History   Social History  . Marital status: Single    Spouse name: N/A  . Number of children: 2  . Years of education: 13   Occupational History  . Retired    Social History Main Topics  . Smoking status: Current Some Day Smoker    Packs/day: 0.25    Years: 30.00    Types: Cigarettes    Last attempt to quit: 01/31/2013  . Smokeless tobacco: Never Used     Comment: "some  days I light one", requests a nicotine patch  . Alcohol use No  . Drug use: No  . Sexual activity: No   Other Topics Concern  . Not on file   Social History Narrative   Fun: Likes to do handyman related projects, photography.    Denies religious beliefs effecting health care.     Family History  Problem Relation Age of Onset  . Diabetes Mother   . Alzheimer's disease Mother   . Hypertension Mother   . Hyperlipidemia Father   . Hypertension Father   . Healthy Maternal Grandmother   . Pneumonia Maternal Grandfather    Review of Systems See HPI Patient Active Problem List   Diagnosis Date Noted  . DKA (diabetic ketoacidoses) ([place]) 05/03/2017  . DKA (diabetic ketoacidosis) ([place]) 03/22/2017  . Elbow pain, right 03/22/2017  . Abdominal pain due to injury 03/22/2017  . Rib pain on left side 01/29/2017  . Dehydration   . Urinary tract infection without hematuria   . Hyperkalemia 01/16/2017  . Acute metabolic encephalopathy 04/88/8916  . Elevated lactic acid level   . AKI (acute kidney injury) ([place]) 12/14/2016  . Hypoglycemia   . Sepsis ([place]) 12/13/2016  . Uncontrolled type 2 diabetes mellitus with complication ([place])   . Acute renal failure superimposed on stage 3 chronic kidney disease ([place])   . Noncompliance with medication regimen 08/17/2016  . History of DVT (deep vein thrombosis) 07/06/2016  . Retroperitoneal hematoma 06/18/2016  . Vitamin B 12 deficiency 06/18/2016  .  Hyperglycemia 05/15/2016  . Anxiety 05/15/2016  . History of CVA (cerebrovascular accident) 05/15/2016  . Hypoglycemia, coma ([place]) 11/02/2015  . Narcotic dependency, continuous ([place]) 11/02/2015  . Uncontrolled type 2 diabetes mellitus with diabetic nephropathy, with long-term current use of insulin ([place]) 08/12/2015  . Depression 08/12/2015  . CKD (chronic kidney disease) stage 3, GFR 30-59 ml/min 03/20/2014    No Known Allergies  Prior to Admission medications   Medication Sig Start Date End Date  Taking? Authorizing Provider  amLODipine (NORVASC) 10 MG tablet Take 1 tablet (10 mg total) by mouth daily. 05/08/17  Yes Eugenie Filler, MD  aspirin 81 MG EC tablet Take 1 tablet (81 mg total) by mouth daily. 01/29/17  Yes [name], [name]  blood glucose meter kit and supplies Dispense based on patient and insurance preference. Use up to four times daily as directed. (FOR ICD-9 250.00, 250.01). 01/20/17  Yes Barton Dubois, MD  blood glucose meter kit and supplies Dispense based on patient and insurance preference. Use up to four times daily as directed. (FOR ICD-9 250.00, 250.01). 05/08/17  Yes Eugenie Filler, MD  carvedilol (COREG) 6.25 MG tablet Take 1 tablet (6.25 mg total) by mouth 2 (two) times daily with a meal. 01/30/17  Yes [name], [name]  feeding supplement, GLUCERNA SHAKE, (GLUCERNA SHAKE) LIQD Take 237 mLs by mouth 2 (two) times daily between meals. 02/01/17  Yes Mariel Aloe, MD  FLUoxetine (PROZAC) 40 MG capsule Take 1 capsule (40 mg total) by mouth daily. 01/30/17  Yes [name], [name]  insulin NPH-regular Human (NOVOLIN 70/30) (70-30) 100 UNIT/ML injection Inject 5 Units into the skin 2 (two) times daily with a meal. 05/08/17  Yes Eugenie Filler, MD  vitamin B-12 (CYANOCOBALAMIN) 1000 MCG tablet Take 1 tablet (1,000 mcg total) by mouth daily. 01/20/17  Yes Barton Dubois, MD  acetaminophen (TYLENOL) 325 MG tablet Take 2 tablets (650 mg total) by mouth every 6 (six) hours as needed for mild pain (or Fever >/= 101). Patient not taking: Reported on 05/12/2017 01/20/17   Barton Dubois, MD  HYDROcodone-acetaminophen (NORCO/VICODIN) 5-325 MG tablet Take 1 tablet by mouth every 6 (six) hours as needed for moderate pain or severe pain. Patient not taking: Reported on 04/27/2017 02/01/17   Mariel Aloe, MD  Insulin Syringe-Needle U-100 (INSULIN SYRINGE .3CC/31GX5/16") 31G X 5/16" 0.3 ML MISC 5 Units by Does not apply route 2 (two) times daily. 05/08/17   Eugenie Filler, MD    Past Medical, Surgical Family and Social History reviewed and updated.    Objective:   [name] [name]'s Vitals   05/12/17 0930  BP: 110/68  Pulse: 60  Resp: 14  Temp: 98.1 F (36.7 C)  TempSrc: Oral  SpO2: 100%  Weight: 138 lb 12.8 oz (63 kg)  Height: 5' 9.5" (1.765 m)    Wt Readings from Last 3 Encounters:  05/12/17 138 lb 12.8 oz (63 kg)  05/06/17 146 lb 2.6 oz (66.3 kg)  04/27/17 135 lb (61.2 kg)   Physical Exam  Constitutional: He is oriented to person, place, and time. He appears well-developed and well-nourished.  HENT:  Head: Normocephalic and atraumatic.  Eyes: Pupils are equal, round, and reactive to light. Conjunctivae are normal.  Neck: Normal range of motion. Neck supple.  Cardiovascular: Normal rate, regular rhythm, normal heart sounds and intact distal pulses.   Pulmonary/Chest: Effort normal and breath sounds normal.  Musculoskeletal: He exhibits no deformity.  Neurological: He is alert and  oriented to person, place, and time. Coordination abnormal.  Skin: Skin is warm and dry.  Psychiatric: He has a normal mood and affect. His behavior is normal. Judgment and thought content normal.   Assessment & Plan:  1. Diabetes mellitus due to underlying condition, uncontrolled, with chronic kidney disease, without long-term current use of insulin, unspecified CKD stage ([place]), uncontrolled. Current A1C 11.6 - HM Diabetes Foot Exam -Discontinue insulin 70/30 -Start Lantus 20 units twice daily -Start insulin 5 units, three times daily with each meal. -ordering diabetes education and medication via Home health to improve medication compliance.  2. CKD (chronic kidney disease) stage 3, GFR 30-59 ml/min - Ambulatory referral to Nephrology, worsening renal function secondary to uncontrolled diabetes.  RTC: 1 month evaluation of diabetes.   Carroll Sage. Kenton Kingfisher, MSN, [name]-C [facility]  [address] [place], [place]  [address] [phone]

## 2017-05-12 NOTE — Telephone Encounter (Signed)
[name] [name],  Please call [facility] to request any future orders for this patient or documentation to out office. They have forwarded prior requests to PCP.   Carroll Sage. Kenton Kingfisher, MSN, [name]-C [facility]  [address] [place], [place] [address] [phone]

## 2017-05-13 DIAGNOSIS — D509 Iron deficiency anemia, unspecified: Secondary | ICD-10-CM | POA: Diagnosis not present

## 2017-05-13 DIAGNOSIS — E1165 Type 2 diabetes mellitus with hyperglycemia: Secondary | ICD-10-CM | POA: Diagnosis not present

## 2017-05-13 DIAGNOSIS — Z7982 Long term (current) use of aspirin: Secondary | ICD-10-CM | POA: Diagnosis not present

## 2017-05-13 DIAGNOSIS — I10 Essential (primary) hypertension: Secondary | ICD-10-CM | POA: Diagnosis not present

## 2017-05-13 DIAGNOSIS — Z87440 Personal history of urinary (tract) infections: Secondary | ICD-10-CM | POA: Diagnosis not present

## 2017-05-13 DIAGNOSIS — Z794 Long term (current) use of insulin: Secondary | ICD-10-CM | POA: Diagnosis not present

## 2017-05-18 DIAGNOSIS — D509 Iron deficiency anemia, unspecified: Secondary | ICD-10-CM | POA: Diagnosis not present

## 2017-05-18 DIAGNOSIS — Z7982 Long term (current) use of aspirin: Secondary | ICD-10-CM | POA: Diagnosis not present

## 2017-05-18 DIAGNOSIS — I10 Essential (primary) hypertension: Secondary | ICD-10-CM | POA: Diagnosis not present

## 2017-05-18 DIAGNOSIS — Z87440 Personal history of urinary (tract) infections: Secondary | ICD-10-CM | POA: Diagnosis not present

## 2017-05-18 DIAGNOSIS — Z794 Long term (current) use of insulin: Secondary | ICD-10-CM | POA: Diagnosis not present

## 2017-05-18 DIAGNOSIS — E1165 Type 2 diabetes mellitus with hyperglycemia: Secondary | ICD-10-CM | POA: Diagnosis not present

## 2017-05-19 DIAGNOSIS — Z87440 Personal history of urinary (tract) infections: Secondary | ICD-10-CM | POA: Diagnosis not present

## 2017-05-19 DIAGNOSIS — I10 Essential (primary) hypertension: Secondary | ICD-10-CM | POA: Diagnosis not present

## 2017-05-19 DIAGNOSIS — Z794 Long term (current) use of insulin: Secondary | ICD-10-CM | POA: Diagnosis not present

## 2017-05-19 DIAGNOSIS — Z7982 Long term (current) use of aspirin: Secondary | ICD-10-CM | POA: Diagnosis not present

## 2017-05-19 DIAGNOSIS — E1165 Type 2 diabetes mellitus with hyperglycemia: Secondary | ICD-10-CM | POA: Diagnosis not present

## 2017-05-19 DIAGNOSIS — D509 Iron deficiency anemia, unspecified: Secondary | ICD-10-CM | POA: Diagnosis not present

## 2017-05-21 ENCOUNTER — Telehealth: Payer: Self-pay | Admitting: Family Medicine

## 2017-05-21 DIAGNOSIS — Z87440 Personal history of urinary (tract) infections: Secondary | ICD-10-CM | POA: Diagnosis not present

## 2017-05-21 DIAGNOSIS — D509 Iron deficiency anemia, unspecified: Secondary | ICD-10-CM | POA: Diagnosis not present

## 2017-05-21 DIAGNOSIS — I10 Essential (primary) hypertension: Secondary | ICD-10-CM | POA: Diagnosis not present

## 2017-05-21 DIAGNOSIS — E1165 Type 2 diabetes mellitus with hyperglycemia: Secondary | ICD-10-CM | POA: Diagnosis not present

## 2017-05-21 DIAGNOSIS — Z794 Long term (current) use of insulin: Secondary | ICD-10-CM | POA: Diagnosis not present

## 2017-05-21 DIAGNOSIS — Z7982 Long term (current) use of aspirin: Secondary | ICD-10-CM | POA: Diagnosis not present

## 2017-05-25 DIAGNOSIS — Z87440 Personal history of urinary (tract) infections: Secondary | ICD-10-CM | POA: Diagnosis not present

## 2017-05-25 DIAGNOSIS — Z7982 Long term (current) use of aspirin: Secondary | ICD-10-CM | POA: Diagnosis not present

## 2017-05-25 DIAGNOSIS — D509 Iron deficiency anemia, unspecified: Secondary | ICD-10-CM | POA: Diagnosis not present

## 2017-05-25 DIAGNOSIS — I10 Essential (primary) hypertension: Secondary | ICD-10-CM | POA: Diagnosis not present

## 2017-05-25 DIAGNOSIS — Z794 Long term (current) use of insulin: Secondary | ICD-10-CM | POA: Diagnosis not present

## 2017-05-25 DIAGNOSIS — E1165 Type 2 diabetes mellitus with hyperglycemia: Secondary | ICD-10-CM | POA: Diagnosis not present

## 2017-05-26 ENCOUNTER — Telehealth: Payer: Self-pay | Admitting: Family Medicine

## 2017-05-26 NOTE — Telephone Encounter (Signed)
Completed form for PCA request and faxed to numbers indicated on coversheet.

## 2017-05-27 ENCOUNTER — Telehealth: Payer: Self-pay | Admitting: Family Medicine

## 2017-05-27 DIAGNOSIS — Z87440 Personal history of urinary (tract) infections: Secondary | ICD-10-CM | POA: Diagnosis not present

## 2017-05-27 DIAGNOSIS — E1165 Type 2 diabetes mellitus with hyperglycemia: Secondary | ICD-10-CM | POA: Diagnosis not present

## 2017-05-27 DIAGNOSIS — D509 Iron deficiency anemia, unspecified: Secondary | ICD-10-CM | POA: Diagnosis not present

## 2017-05-27 DIAGNOSIS — I10 Essential (primary) hypertension: Secondary | ICD-10-CM | POA: Diagnosis not present

## 2017-05-27 DIAGNOSIS — Z7982 Long term (current) use of aspirin: Secondary | ICD-10-CM | POA: Diagnosis not present

## 2017-05-27 DIAGNOSIS — Z794 Long term (current) use of insulin: Secondary | ICD-10-CM | POA: Diagnosis not present

## 2017-05-27 NOTE — Telephone Encounter (Signed)
Received a call from the answering service regarding BS > 400, per nurse with [facility]. Spoke with Suella Grove, nurse with [facility]. His blood sugar is down to 358 after administration of 15 units of insulin around 11:30 AM. She advised me that the daughter is not administered insulin according to the regimen recently discussed during office visit and he remains on a SS and only receives insulin when she is available to give him insulin. Advised nurse to administer another dose of 10 units regular insulin. Mr. Boesen needs to increase his fluid intake with water today in addition he needs to eat something for large to prevent hypoglycemia episode later in the day. Mr. Macphee is scheduled to see me on Friday at 945 for follow-up. Advised nurse to have her person caring for him later this afternoon notify me here at the office if his blood sugar is above 400 again today.  Carroll Sage. Kenton Kingfisher, MSN, [name]-C [facility]  [address] [place], [place] [address] [phone]

## 2017-05-29 ENCOUNTER — Ambulatory Visit: Payer: Medicare Other | Admitting: Family Medicine

## 2017-05-29 DIAGNOSIS — E1165 Type 2 diabetes mellitus with hyperglycemia: Secondary | ICD-10-CM | POA: Diagnosis not present

## 2017-05-29 DIAGNOSIS — Z7982 Long term (current) use of aspirin: Secondary | ICD-10-CM | POA: Diagnosis not present

## 2017-05-29 DIAGNOSIS — Z794 Long term (current) use of insulin: Secondary | ICD-10-CM | POA: Diagnosis not present

## 2017-05-29 DIAGNOSIS — Z87440 Personal history of urinary (tract) infections: Secondary | ICD-10-CM | POA: Diagnosis not present

## 2017-05-29 DIAGNOSIS — D509 Iron deficiency anemia, unspecified: Secondary | ICD-10-CM | POA: Diagnosis not present

## 2017-05-29 DIAGNOSIS — I10 Essential (primary) hypertension: Secondary | ICD-10-CM | POA: Diagnosis not present

## 2017-05-29 NOTE — Telephone Encounter (Signed)
Patient sugar was 346 at 920am  And 411 at 1120am. Patient was given 5 units at 9am and 5 units at 11am.  Per Hattie Perch was advise to give patient 5 more units and recheck in 15 mins.

## 2017-06-02 ENCOUNTER — Telehealth: Payer: Self-pay | Admitting: Family Medicine

## 2017-06-02 DIAGNOSIS — I10 Essential (primary) hypertension: Secondary | ICD-10-CM | POA: Diagnosis not present

## 2017-06-02 DIAGNOSIS — Z794 Long term (current) use of insulin: Secondary | ICD-10-CM | POA: Diagnosis not present

## 2017-06-02 DIAGNOSIS — Z87440 Personal history of urinary (tract) infections: Secondary | ICD-10-CM | POA: Diagnosis not present

## 2017-06-02 DIAGNOSIS — E1165 Type 2 diabetes mellitus with hyperglycemia: Secondary | ICD-10-CM | POA: Diagnosis not present

## 2017-06-02 DIAGNOSIS — D509 Iron deficiency anemia, unspecified: Secondary | ICD-10-CM | POA: Diagnosis not present

## 2017-06-02 DIAGNOSIS — Z7982 Long term (current) use of aspirin: Secondary | ICD-10-CM | POA: Diagnosis not present

## 2017-06-02 NOTE — Telephone Encounter (Signed)
Received a call from home health regarding [name] [name] glucose was 53 at lunch, 409 this morning and meter read as high yesterday.  [name] was eating lunch and advised nurse to recheck blood sugar if remained low, to call back to notify me here at the office.

## 2017-06-02 NOTE — Telephone Encounter (Signed)
Blood sugar 53 before  [phone] 6 units  9pm 499

## 2017-06-04 DIAGNOSIS — Z87440 Personal history of urinary (tract) infections: Secondary | ICD-10-CM | POA: Diagnosis not present

## 2017-06-04 DIAGNOSIS — Z794 Long term (current) use of insulin: Secondary | ICD-10-CM | POA: Diagnosis not present

## 2017-06-04 DIAGNOSIS — D509 Iron deficiency anemia, unspecified: Secondary | ICD-10-CM | POA: Diagnosis not present

## 2017-06-04 DIAGNOSIS — E1165 Type 2 diabetes mellitus with hyperglycemia: Secondary | ICD-10-CM | POA: Diagnosis not present

## 2017-06-04 DIAGNOSIS — Z7982 Long term (current) use of aspirin: Secondary | ICD-10-CM | POA: Diagnosis not present

## 2017-06-04 DIAGNOSIS — I10 Essential (primary) hypertension: Secondary | ICD-10-CM | POA: Diagnosis not present

## 2017-06-09 ENCOUNTER — Emergency Department (HOSPITAL_COMMUNITY)
Admission: EM | Admit: 2017-06-09 | Discharge: 2017-06-09 | Disposition: A | Discharge status: Home / Self Care | Payer: Medicare Other | Attending: Emergency Medicine | Admitting: Emergency Medicine

## 2017-06-09 ENCOUNTER — Ambulatory Visit (INDEPENDENT_AMBULATORY_CARE_PROVIDER_SITE_OTHER): Payer: Medicare Other | Admitting: Family Medicine

## 2017-06-09 ENCOUNTER — Encounter: Payer: Self-pay | Admitting: Family Medicine

## 2017-06-09 ENCOUNTER — Encounter (HOSPITAL_COMMUNITY): Payer: Self-pay | Admitting: Emergency Medicine

## 2017-06-09 VITALS — BP 133/74 | HR 75 | Temp 98.5°F | Resp 14 | Ht 69.0 in | Wt 135.2 lb

## 2017-06-09 DIAGNOSIS — Z79899 Other long term (current) drug therapy: Secondary | ICD-10-CM | POA: Diagnosis not present

## 2017-06-09 DIAGNOSIS — Z9114 Patient's other noncompliance with medication regimen: Secondary | ICD-10-CM | POA: Diagnosis not present

## 2017-06-09 DIAGNOSIS — E1165 Type 2 diabetes mellitus with hyperglycemia: Secondary | ICD-10-CM

## 2017-06-09 DIAGNOSIS — E108 Type 1 diabetes mellitus with unspecified complications: Secondary | ICD-10-CM | POA: Diagnosis not present

## 2017-06-09 DIAGNOSIS — IMO0002 Reserved for concepts with insufficient information to code with codable children: Secondary | ICD-10-CM

## 2017-06-09 DIAGNOSIS — N183 Chronic kidney disease, stage 3 (moderate): Secondary | ICD-10-CM | POA: Diagnosis not present

## 2017-06-09 DIAGNOSIS — Z794 Long term (current) use of insulin: Secondary | ICD-10-CM | POA: Diagnosis not present

## 2017-06-09 DIAGNOSIS — F17210 Nicotine dependence, cigarettes, uncomplicated: Secondary | ICD-10-CM | POA: Insufficient documentation

## 2017-06-09 DIAGNOSIS — E1122 Type 2 diabetes mellitus with diabetic chronic kidney disease: Secondary | ICD-10-CM | POA: Diagnosis not present

## 2017-06-09 DIAGNOSIS — R739 Hyperglycemia, unspecified: Secondary | ICD-10-CM

## 2017-06-09 DIAGNOSIS — E118 Type 2 diabetes mellitus with unspecified complications: Secondary | ICD-10-CM | POA: Diagnosis not present

## 2017-06-09 DIAGNOSIS — R0682 Tachypnea, not elsewhere classified: Secondary | ICD-10-CM | POA: Diagnosis not present

## 2017-06-09 LAB — GLUCOSE, CAPILLARY
Glucose-Capillary: >600 mg/dL (ref 65–99)
Glucose-Capillary: >600 mg/dL (ref 65–99)

## 2017-06-09 LAB — CBC
HCT: 32.6 % — ABNORMAL LOW (ref 39.0–52.0)
Hemoglobin: 11 g/dL — ABNORMAL LOW (ref 13.0–17.0)
MCH: 30 pg (ref 26.0–34.0)
MCHC: 33.7 g/dL (ref 30.0–36.0)
MCV: 88.8 fL (ref 78.0–100.0)
Platelets: 310 10*3/uL (ref 150–400)
RBC: 3.67 MIL/uL — ABNORMAL LOW (ref 4.22–5.81)
RDW: 14.2 % (ref 11.5–15.5)
WBC: 8.9 10*3/uL (ref 4.0–10.5)

## 2017-06-09 LAB — COMPREHENSIVE METABOLIC PANEL
ALT: 18 U/L (ref 17–63)
AST: 20 U/L (ref 15–41)
Albumin: 4 g/dL (ref 3.5–5.0)
Alkaline Phosphatase: 120 U/L (ref 38–126)
Anion gap: 10 (ref 5–15)
BUN: 30 mg/dL — ABNORMAL HIGH (ref 6–20)
CO2: 23 mmol/L (ref 22–32)
Calcium: 9.5 mg/dL (ref 8.9–10.3)
Chloride: 104 mmol/L (ref 101–111)
Creatinine, Ser: 1.41 mg/dL — ABNORMAL HIGH (ref 0.61–1.24)
GFR calc Af Amer: 56 mL/min — ABNORMAL LOW (ref >60)
GFR calc non Af Amer: 48 mL/min — ABNORMAL LOW (ref >60)
Glucose, Bld: 86 mg/dL (ref 65–99)
Potassium: 4 mmol/L (ref 3.5–5.1)
Sodium: 137 mmol/L (ref 135–145)
Total Bilirubin: 0.5 mg/dL (ref 0.3–1.2)
Total Protein: 8 g/dL (ref 6.5–8.1)

## 2017-06-09 LAB — CBG MONITORING, ED
Glucose-Capillary: 98 mg/dL (ref 65–99)
Glucose-Capillary: 89 mg/dL (ref 65–99)
Glucose-Capillary: 248 mg/dL — ABNORMAL HIGH (ref 65–99)
Glucose-Capillary: 415 mg/dL — ABNORMAL HIGH (ref 65–99)

## 2017-06-09 NOTE — Patient Instructions (Addendum)
I am referring you to the Emergency Department for further evaluation of  High blood sugar.   I am referring you to endocrinology for further evaluation of your uncontrolled diabetes.  I encourage monitoring of glucose three times daily with meals and at bedtime.   Continue current regimen of Lantus 20 units every 12 hours with protein rich snack.  Continue Novolog 5 units with each meal.

## 2017-06-09 NOTE — ED Notes (Signed)
Patient ate [place] sandwich and drank OJ.

## 2017-06-09 NOTE — Progress Notes (Signed)
Patient ID: [name] [name], male    DOB: 1946-01-11, 71 y.o.   MRN: [id number]  PCP: [name], [name]  Chief Complaint  Patient presents with  . Follow-up    diabetes    Subjective:  HPI [name] [name] is a 71 y.o. male with a history of uncontrolled diabetes and other chronic conditions presents for evaluation of hyperglycemia. [name] [name] resides at home and is currently under the care of home health and his daughter who is present at today's visit. [name] [name] has an extensive history of noncompliance with diabetes regimen, he continues to be a current smoker, and admits to a poor dietary intake. [name] [name] daughter reports that [name] [name] has not had any insulin today as he has no testing strips and she did not want to give him insulin and fear of him being hypoglycemic. Yesterday she reports she had a blood sugar reading of high on the meter and reports that he was also hypoglycemic within the last 48 hours with a blood sugar of 17. At present his blood sugar is reading "high" x 2 readings here in the office. [name] [name] was placed on a revised diabetes regimen at his last office visit however according to reports from home health and multiple discrepancies reported by his daughter here in office today it appears that he is not following the previously prescribed regimen. His home health nurses have called report here to the office that the patient's insulin had not been administered by his daughter as prescribed and when she administered medication, she had not recorded which insulin given and  therefore it was difficult to assess how his current medication regiment was impacting his glucose readings. His daughter is very much against discussion or consideration of [name] [name] p.m. placed in an assisted living facility where he could receive more optimal care. At present his daughter reports today that he doesn't have any money, nor food in the house and she is taking him over to live with his mother and  sister until she is able to get her finances stable and hire someone to provide care for him during the day while she is at work.  Social History   Social History  . Marital status: Single    Spouse name: N/A  . Number of children: 2  . Years of education: 13   Occupational History  . Retired    Social History Main Topics  . Smoking status: Current Some Day Smoker    Packs/day: 0.25    Years: 30.00    Types: Cigarettes    Last attempt to quit: 01/31/2013  . Smokeless tobacco: Never Used     Comment: "some days I light one", requests a nicotine patch  . Alcohol use No  . Drug use: No  . Sexual activity: No   Other Topics Concern  . Not on file   Social History Narrative   Fun: Likes to do handyman related projects, photography.    Denies religious beliefs effecting health care.     Family History  Problem Relation Age of Onset  . Diabetes Mother   . Alzheimer's disease Mother   . Hypertension Mother   . Hyperlipidemia Father   . Hypertension Father   . Healthy Maternal Grandmother   . Pneumonia Maternal Grandfather    Review of Systems See HPI  Patient Active Problem List   Diagnosis Date Noted  . DKA (diabetic ketoacidoses) ([place]) 05/03/2017  . DKA (diabetic ketoacidosis) ([place]) 03/22/2017  .  Elbow pain, right 03/22/2017  . Abdominal pain due to injury 03/22/2017  . Rib pain on left side 01/29/2017  . Dehydration   . Urinary tract infection without hematuria   . Hyperkalemia 01/16/2017  . Acute metabolic encephalopathy 11/57/2620  . Elevated lactic acid level   . AKI (acute kidney injury) ([place]) 12/14/2016  . Hypoglycemia   . Sepsis ([place]) 12/13/2016  . Uncontrolled type 2 diabetes mellitus with complication ([place])   . Acute renal failure superimposed on stage 3 chronic kidney disease ([place])   . Noncompliance with medication regimen 08/17/2016  . History of DVT (deep vein thrombosis) 07/06/2016  . Retroperitoneal hematoma 06/18/2016  . Vitamin B 12 deficiency  06/18/2016  . Hyperglycemia 05/15/2016  . Anxiety 05/15/2016  . History of CVA (cerebrovascular accident) 05/15/2016  . Hypoglycemia, coma ([place]) 11/02/2015  . Narcotic dependency, continuous ([place]) 11/02/2015  . Uncontrolled type 2 diabetes mellitus with diabetic nephropathy, with long-term current use of insulin ([place]) 08/12/2015  . Depression 08/12/2015  . CKD (chronic kidney disease) stage 3, GFR 30-59 ml/min 03/20/2014    No Known Allergies  Prior to Admission medications   Medication Sig Start Date End Date Taking? Authorizing Provider  acetaminophen (TYLENOL) 325 MG tablet Take 2 tablets (650 mg total) by mouth every 6 (six) hours as needed for mild pain (or Fever >/= 101). 01/20/17  Yes Barton Dubois, MD  amLODipine (NORVASC) 10 MG tablet Take 1 tablet (10 mg total) by mouth daily. 05/08/17  Yes Eugenie Filler, MD  aspirin 81 MG EC tablet Take 1 tablet (81 mg total) by mouth daily. 01/29/17  Yes [name], [name]  blood glucose meter kit and supplies Dispense based on patient and insurance preference. Use up to four times daily as directed. (FOR ICD-9 250.00, 250.01). 01/20/17  Yes Barton Dubois, MD  blood glucose meter kit and supplies Dispense based on patient and insurance preference. Use up to four times daily as directed. (FOR ICD-9 250.00, 250.01). 05/08/17  Yes Eugenie Filler, MD  carvedilol (COREG) 6.25 MG tablet Take 1 tablet (6.25 mg total) by mouth 2 (two) times daily with a meal. 01/30/17  Yes [name], [name]  feeding supplement, GLUCERNA SHAKE, (GLUCERNA SHAKE) LIQD Take 237 mLs by mouth 2 (two) times daily between meals. 02/01/17  Yes Mariel Aloe, MD  FLUoxetine (PROZAC) 40 MG capsule Take 1 capsule (40 mg total) by mouth daily. 01/30/17  Yes [name], [name]  HYDROcodone-acetaminophen (NORCO/VICODIN) 5-325 MG tablet Take 1 tablet by mouth every 6 (six) hours as needed for moderate pain or severe pain. 02/01/17  Yes Mariel Aloe, MD  insulin  aspart (NOVOLOG) 100 UNIT/ML FlexPen Inject 5 Units into the skin 3 (three) times daily with meals. For blood sugars greater than 150 only 05/12/17  Yes [name], [name]  Insulin Glargine (LANTUS SOLOSTAR) 100 UNIT/ML Solostar Pen Inject 20 Units into the skin every 12 (twelve) hours. 05/12/17  Yes [name], [name]  Insulin Pen Needle 29G X 10MM MISC To attached to insulin pen and administer medication as prescribed. 05/12/17  Yes [name], [name]  Insulin Syringe-Needle U-100 (INSULIN SYRINGE .3CC/31GX5/16") 31G X 5/16" 0.3 ML MISC 5 Units by Does not apply route 2 (two) times daily. 05/08/17  Yes Eugenie Filler, MD  traMADol-acetaminophen (ULTRACET) 37.5-325 MG tablet Take 1 tablet by mouth every 8 (eight) hours as needed for severe pain. 05/12/17  Yes [name], [name]  vitamin B-12 (CYANOCOBALAMIN)  1000 MCG tablet Take 1 tablet (1,000 mcg total) by mouth daily. 01/20/17  Yes Barton Dubois, MD    Past Medical, Surgical Family and Social History reviewed and updated.    Objective:   Today's Vitals   06/09/17 1003  BP: 133/74  Pulse: 75  Resp: 14  Temp: 98.5 F (36.9 C)  TempSrc: Oral  SpO2: 96%  Weight: 135 lb 3.2 oz (61.3 kg)  Height: _0  (1.753 m)    Wt Readings from Last 3 Encounters:  06/09/17 135 lb 3.2 oz (61.3 kg)  05/12/17 138 lb 12.8 oz (63 kg)  05/06/17 146 lb 2.6 oz (66.3 kg)   Physical Exam  Constitutional: He is oriented to person, place, and time. He appears ill.  Thin appearing   HENT:  Head: Normocephalic and atraumatic.  Eyes: Pupils are equal, round, and reactive to light. Conjunctivae are normal.  Neck: Normal range of motion. Neck supple.  Cardiovascular: Normal rate, regular rhythm, normal heart sounds and intact distal pulses.   Pulmonary/Chest: Effort normal and breath sounds normal.  Abdominal: Soft. Bowel sounds are normal.  Musculoskeletal: Normal range of motion.  Neurological: He is alert and oriented to person,  place, and time.  Skin: Skin is warm and dry.  Psychiatric: He has a normal mood and affect. His behavior is normal. Judgment and thought content normal.     Assessment & Plan:  1. Uncontrolled type 2 diabetes mellitus with complication, with long-term current use of insulin ([place]), uncontrolled A1C 11.6, 05/03/2017 -Referring patient to the Emergency Department for further evaluation of "High" reading on glucose meter x 2 measurements. He is being referred to the ED for treatment. Did not administer any medication here in office concerned that patient's daughter most likely would not have him evaluated in the emergency department.  2. Medication noncompliance due to cognitive impairment, patient has recently received extensive diabetes self- management education through home health. Barriers to compliance include current cognitive ability, financial limitations, and lack of knowledge and medication compliance  from primary caregiver.     Orders Placed This Encounter  Procedures  . Glucose, capillary  . Glucose, capillary  . Ambulatory referral to Endocrinology    Carroll Sage. Kenton Kingfisher, MSN, [name]-C [facility]  [address] Barbara Cower [place], [place] [address] [phone]

## 2017-06-09 NOTE — ED Provider Notes (Signed)
[facility] DEPT Provider Note   CSN: [id number] Arrival date & time: 06/09/17  1431     History   Chief Complaint Chief Complaint  Patient presents with  . Hyperglycemia    HPI [name] [name] is a 71 y.o. male.  HPI  71 year old male with an extensive past medical history listed below including diabetes on insulin who presents to the emergency department for hyperglycemia. Patient was seen by his primary care provider earlier today and noted to have a CBG greater than 600. He was instructed to present to the emergency department for further workup and management. Patient decided to go home instead. His daughter gave the patient 20 units of NovoLog. Repeat checks revealed that the patient's sugar was in the 140s. Patient reports not having eaten today. Family was able to talk the patient into coming to the emergency department. Patient is denied any physical complaints including dizziness, headache, focal weakness, nausea, chest pain, shortness of breath, abdominal pain. He denies any recent fevers, or infections. Currently patient is asymptomatic. No other alleviating or aggravating factors.  Past Medical History:  Diagnosis Date  . 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 Closed comminuted intertrochanteric fracture of left femur ([place])   . Diabetes mellitus without complication ([place])   . Diabetic hyperosmolar non-ketotic state ([place]) 05/15/2016  . DKA (diabetic ketoacidoses) ([place]) 05/09/2016  . Hypertension   . Postoperative anemia due to acute blood loss 06/18/2016  . Retroperitoneal hematoma 06/18/2016  . Stroke ([place])   . Vitamin B 12 deficiency 06/18/2016    Patient Active Problem List   Diagnosis Date Noted  . DKA (diabetic ketoacidoses) ([place]) 05/03/2017  . DKA (diabetic ketoacidosis) ([place]) 03/22/2017  . Elbow pain, right 03/22/2017  . Abdominal pain due to injury 03/22/2017  . Rib pain on left side 01/29/2017  . Dehydration     . Urinary tract infection without hematuria   . Hyperkalemia 01/16/2017  . Acute metabolic encephalopathy 12/75/1700  . Elevated lactic acid level   . AKI (acute kidney injury) ([place]) 12/14/2016  . Hypoglycemia   . Sepsis ([place]) 12/13/2016  . Uncontrolled type 2 diabetes mellitus with complication ([place])   . Acute renal failure superimposed on stage 3 chronic kidney disease ([place])   . Noncompliance with medication regimen 08/17/2016  . History of DVT (deep vein thrombosis) 07/06/2016  . Retroperitoneal hematoma 06/18/2016  . Vitamin B 12 deficiency 06/18/2016  . Hyperglycemia 05/15/2016  . Anxiety 05/15/2016  . History of CVA (cerebrovascular accident) 05/15/2016  . Hypoglycemia, coma ([place]) 11/02/2015  . Narcotic dependency, continuous ([place]) 11/02/2015  . Uncontrolled type 2 diabetes mellitus with diabetic nephropathy, with long-term current use of insulin ([place]) 08/12/2015  . Depression 08/12/2015  . CKD (chronic kidney disease) stage 3, GFR 30-59 ml/min 03/20/2014    Past Surgical History:  Procedure Laterality Date  . HIP ARTHROPLASTY Right 02/05/2013   Procedure: ARTHROPLASTY BIPOLAR HIP;  Surgeon: [name], MD;  Location: [facility];  Service: Orthopedics;  Laterality: Right;  . INTRAMEDULLARY (IM) NAIL INTERTROCHANTERIC Left 06/16/2016   Procedure: INTRAMEDULLARY (IM) NAIL INTERTROCHANTRIC;  Surgeon: Rod Can, MD;  Location: [facility];  Service: Orthopedics;  Laterality: Left;       Home Medications    Prior to Admission medications   Medication Sig Start Date End Date Taking? Authorizing Provider  amLODipine (NORVASC) 10 MG tablet Take 1 tablet (10 mg total) by mouth daily. 05/08/17  Yes Eugenie Filler, MD  aspirin 81  MG EC tablet Take 1 tablet (81 mg total) by mouth daily. 01/29/17  Yes [name], [name]  carvedilol (COREG) 6.25 MG tablet Take 1 tablet (6.25 mg total) by mouth 2 (two) times daily with a meal. 01/30/17  Yes [name], [name]  FLUoxetine  (PROZAC) 40 MG capsule Take 1 capsule (40 mg total) by mouth daily. 01/30/17  Yes [name], [name]  Insulin Glargine (LANTUS SOLOSTAR) 100 UNIT/ML Solostar Pen Inject 20 Units into the skin every 12 (twelve) hours. 05/12/17  Yes [name], [name]  NOVOLIN 70/30 (70-30) 100 UNIT/ML injection Inject 5 Units into the skin 2 (two) times daily with a meal.  05/26/17  Yes [provider]  acetaminophen (TYLENOL) 325 MG tablet Take 2 tablets (650 mg total) by mouth every 6 (six) hours as needed for mild pain (or Fever >/= 101). 01/20/17   Barton Dubois, MD  blood glucose meter kit and supplies Dispense based on patient and insurance preference. Use up to four times daily as directed. (FOR ICD-9 250.00, 250.01). 01/20/17   Barton Dubois, MD  blood glucose meter kit and supplies Dispense based on patient and insurance preference. Use up to four times daily as directed. (FOR ICD-9 250.00, 250.01). 05/08/17   Eugenie Filler, MD  feeding supplement, GLUCERNA SHAKE, (GLUCERNA SHAKE) LIQD Take 237 mLs by mouth 2 (two) times daily between meals. Patient not taking: Reported on 06/09/2017 02/01/17   Mariel Aloe, MD  HYDROcodone-acetaminophen (NORCO/VICODIN) 5-325 MG tablet Take 1 tablet by mouth every 6 (six) hours as needed for moderate pain or severe pain. Patient not taking: Reported on 06/09/2017 02/01/17   Mariel Aloe, MD  insulin aspart (NOVOLOG) 100 UNIT/ML FlexPen Inject 5 Units into the skin 3 (three) times daily with meals. For blood sugars greater than 150 only Patient not taking: Reported on 06/09/2017 05/12/17   [name], [name]  Insulin Pen Needle 29G X 10MM MISC To attached to insulin pen and administer medication as prescribed. 05/12/17   [name], [name]  Insulin Syringe-Needle U-100 (INSULIN SYRINGE .3CC/31GX5/16") 31G X 5/16" 0.3 ML MISC 5 Units by Does not apply route 2 (two) times daily. 05/08/17   Eugenie Filler, MD  traMADol-acetaminophen (ULTRACET)  37.5-325 MG tablet Take 1 tablet by mouth every 8 (eight) hours as needed for severe pain. Patient not taking: Reported on 06/09/2017 05/12/17   [name], [name]  vitamin B-12 (CYANOCOBALAMIN) 1000 MCG tablet Take 1 tablet (1,000 mcg total) by mouth daily. Patient not taking: Reported on 06/09/2017 01/20/17   Barton Dubois, MD    Family History Family History  Problem Relation Age of Onset  . Diabetes Mother   . Alzheimer's disease Mother   . Hypertension Mother   . Hyperlipidemia Father   . Hypertension Father   . Healthy Maternal Grandmother   . Pneumonia Maternal Grandfather     Social History Social History  Substance Use Topics  . Smoking status: Current Some Day Smoker    Packs/day: 0.25    Years: 30.00    Types: Cigarettes    Last attempt to quit: 01/31/2013  . Smokeless tobacco: Never Used     Comment: "some days I light one", requests a nicotine patch  . Alcohol use No     Allergies   Patient has no known allergies.   Review of Systems Review of Systems All other systems are reviewed and are negative for acute change except as noted in the  HPI   Physical Exam Updated Vital Signs BP 121/83 (BP Location: Right Arm)   Pulse 64   Temp 98.1 F (36.7 C) (Oral)   Resp 16   Ht '5\' 9"'  (1.753 m)   Wt 61.2 kg (135 lb)   SpO2 100%   BMI 19.94 kg/m   Physical Exam  Constitutional: He is oriented to person, place, and time. He appears well-developed and well-nourished. No distress.  HENT:  Head: Normocephalic and atraumatic.  Nose: Nose normal.  Eyes: Pupils are equal, round, and reactive to light. Conjunctivae and EOM are normal. Right eye exhibits no discharge. Left eye exhibits no discharge. No scleral icterus.  Neck: Normal range of motion. Neck supple.  Cardiovascular: Normal rate and regular rhythm.  Exam reveals no gallop and no friction rub.   No murmur heard. Pulmonary/Chest: Effort normal and breath sounds normal. No stridor. No respiratory  distress. He has no rales.  Abdominal: Soft. He exhibits no distension. There is no tenderness.  Musculoskeletal: He exhibits no edema or tenderness.  Neurological: He is alert and oriented to person, place, and time.  Skin: Skin is warm and dry. No rash noted. He is not diaphoretic. No erythema.  Psychiatric: He has a normal mood and affect.  Vitals reviewed.    ED Treatments / Results  Labs (all labs ordered are listed, but only abnormal results are displayed) Labs Reviewed  CBC - Abnormal; Notable for the following:       Result Value   RBC 3.67 (*)    Hemoglobin 11.0 (*)    HCT 32.6 (*)    All other components within normal limits  COMPREHENSIVE METABOLIC PANEL - Abnormal; Notable for the following:    BUN 30 (*)    Creatinine, Ser 1.41 (*)    GFR calc non Af Amer 48 (*)    GFR calc Af Amer 56 (*)    All other components within normal limits  CBG MONITORING, ED - Abnormal; Notable for the following:    Glucose-Capillary 248 (*)    All other components within normal limits  URINALYSIS, ROUTINE W REFLEX MICROSCOPIC  CBG MONITORING, ED  CBG MONITORING, ED  CBG MONITORING, ED    EKG  EKG Interpretation None       Radiology No results found.  Procedures Procedures (including critical care time)  Medications Ordered in ED Medications - No data to display   Initial Impression / Assessment and Plan / ED Course  I have reviewed the triage vital signs and the nursing notes.  Pertinent labs & imaging results that were available during my care of the patient were reviewed by me and considered in my medical decision making (see chart for details).      Current CBG and 98. Screening labs obtained without evidence of DKA or HHS. Patient provided with oral nutrition. Repeat sugar up to 240.  The patient is safe for discharge with strict return precautions.  Recommended continuing on his diabetic medication and to have close follow-up with his primary care  provider.   Final Clinical Impressions(s) / ED Diagnoses   Final diagnoses:  Hyperglycemia   Disposition: Discharge  Condition: Good  I have discussed the results, Dx and Tx plan with the patient who expressed understanding and agree(s) with the plan. Discharge instructions discussed at great length. The patient was given strict return precautions who verbalized understanding of the instructions. No further questions at time of discharge.    New Prescriptions   No medications on  file    Follow Up: [name], [place] [address] [phone]  Schedule an appointment as soon as possible for a visit  For close follow up for management of your diabetes      Cardama, Grayce Sessions, MD 06/09/17 [phone]

## 2017-06-09 NOTE — ED Notes (Signed)
Bed: [facility] Expected date:  Expected time:  Means of arrival:  Comments: 

## 2017-06-09 NOTE — ED Notes (Signed)
Attempted blood draw with no success.     

## 2017-06-09 NOTE — ED Triage Notes (Signed)
Per EMS, pt was seen at PCP today for hyperglycemia and told to come to ED. Pt went home instead and took 4 units at 1PM. Pt's family then convinced pt to come to ED. Pt's initial CBG was 251, last checked was 165.

## 2017-06-12 DIAGNOSIS — Z794 Long term (current) use of insulin: Secondary | ICD-10-CM | POA: Diagnosis not present

## 2017-06-12 DIAGNOSIS — I10 Essential (primary) hypertension: Secondary | ICD-10-CM | POA: Diagnosis not present

## 2017-06-12 DIAGNOSIS — E1165 Type 2 diabetes mellitus with hyperglycemia: Secondary | ICD-10-CM | POA: Diagnosis not present

## 2017-06-12 DIAGNOSIS — Z7982 Long term (current) use of aspirin: Secondary | ICD-10-CM | POA: Diagnosis not present

## 2017-06-12 DIAGNOSIS — Z87440 Personal history of urinary (tract) infections: Secondary | ICD-10-CM | POA: Diagnosis not present

## 2017-06-12 DIAGNOSIS — D509 Iron deficiency anemia, unspecified: Secondary | ICD-10-CM | POA: Diagnosis not present

## 2017-06-17 DIAGNOSIS — I10 Essential (primary) hypertension: Secondary | ICD-10-CM | POA: Diagnosis not present

## 2017-06-17 DIAGNOSIS — Z87440 Personal history of urinary (tract) infections: Secondary | ICD-10-CM | POA: Diagnosis not present

## 2017-06-17 DIAGNOSIS — E1165 Type 2 diabetes mellitus with hyperglycemia: Secondary | ICD-10-CM | POA: Diagnosis not present

## 2017-06-17 DIAGNOSIS — Z794 Long term (current) use of insulin: Secondary | ICD-10-CM | POA: Diagnosis not present

## 2017-06-17 DIAGNOSIS — D509 Iron deficiency anemia, unspecified: Secondary | ICD-10-CM | POA: Diagnosis not present

## 2017-06-17 DIAGNOSIS — Z7982 Long term (current) use of aspirin: Secondary | ICD-10-CM | POA: Diagnosis not present

## 2017-06-24 DIAGNOSIS — Z7982 Long term (current) use of aspirin: Secondary | ICD-10-CM | POA: Diagnosis not present

## 2017-06-24 DIAGNOSIS — D509 Iron deficiency anemia, unspecified: Secondary | ICD-10-CM | POA: Diagnosis not present

## 2017-06-24 DIAGNOSIS — Z87440 Personal history of urinary (tract) infections: Secondary | ICD-10-CM | POA: Diagnosis not present

## 2017-06-24 DIAGNOSIS — Z794 Long term (current) use of insulin: Secondary | ICD-10-CM | POA: Diagnosis not present

## 2017-06-24 DIAGNOSIS — I10 Essential (primary) hypertension: Secondary | ICD-10-CM | POA: Diagnosis not present

## 2017-06-24 DIAGNOSIS — E1165 Type 2 diabetes mellitus with hyperglycemia: Secondary | ICD-10-CM | POA: Diagnosis not present

## 2017-06-25 ENCOUNTER — Telehealth: Payer: Self-pay

## 2017-06-25 NOTE — Telephone Encounter (Signed)
Museum/gallery conservator at [facility] [employer] have tried to contact patient several time with no luck and also sent out a letter. They will be closing referral and patient will have to be re referred to there office

## 2017-06-27 ENCOUNTER — Encounter (HOSPITAL_COMMUNITY): Payer: Self-pay | Admitting: Emergency Medicine

## 2017-06-27 ENCOUNTER — Inpatient Hospital Stay (HOSPITAL_COMMUNITY)
Admission: EM | Admit: 2017-06-27 | Discharge: 2017-06-30 | DRG: 644 | Disposition: A | Discharge status: Home / Self Care | Payer: Medicare Other | Attending: Internal Medicine | Admitting: Internal Medicine

## 2017-06-27 ENCOUNTER — Emergency Department (HOSPITAL_COMMUNITY): Payer: Medicare Other

## 2017-06-27 DIAGNOSIS — Z794 Long term (current) use of insulin: Secondary | ICD-10-CM

## 2017-06-27 DIAGNOSIS — E160 Drug-induced hypoglycemia without coma: Principal | ICD-10-CM | POA: Diagnosis present

## 2017-06-27 DIAGNOSIS — T68XXXA Hypothermia, initial encounter: Secondary | ICD-10-CM

## 2017-06-27 DIAGNOSIS — Z96641 Presence of right artificial hip joint: Secondary | ICD-10-CM | POA: Diagnosis present

## 2017-06-27 DIAGNOSIS — N183 Chronic kidney disease, stage 3 unspecified: Secondary | ICD-10-CM | POA: Diagnosis present

## 2017-06-27 DIAGNOSIS — E871 Hypo-osmolality and hyponatremia: Secondary | ICD-10-CM | POA: Diagnosis present

## 2017-06-27 DIAGNOSIS — Z79899 Other long term (current) drug therapy: Secondary | ICD-10-CM

## 2017-06-27 DIAGNOSIS — I10 Essential (primary) hypertension: Secondary | ICD-10-CM | POA: Diagnosis not present

## 2017-06-27 DIAGNOSIS — R739 Hyperglycemia, unspecified: Secondary | ICD-10-CM | POA: Diagnosis not present

## 2017-06-27 DIAGNOSIS — Z23 Encounter for immunization: Secondary | ICD-10-CM

## 2017-06-27 DIAGNOSIS — E872 Acidosis, unspecified: Secondary | ICD-10-CM | POA: Diagnosis present

## 2017-06-27 DIAGNOSIS — Z9114 Patient's other noncompliance with medication regimen: Secondary | ICD-10-CM

## 2017-06-27 DIAGNOSIS — E162 Hypoglycemia, unspecified: Secondary | ICD-10-CM | POA: Diagnosis not present

## 2017-06-27 DIAGNOSIS — Z7982 Long term (current) use of aspirin: Secondary | ICD-10-CM

## 2017-06-27 DIAGNOSIS — Z8673 Personal history of transient ischemic attack (TIA), and cerebral infarction without residual deficits: Secondary | ICD-10-CM

## 2017-06-27 DIAGNOSIS — D638 Anemia in other chronic diseases classified elsewhere: Secondary | ICD-10-CM | POA: Diagnosis not present

## 2017-06-27 DIAGNOSIS — E119 Type 2 diabetes mellitus without complications: Secondary | ICD-10-CM | POA: Diagnosis not present

## 2017-06-27 DIAGNOSIS — E1165 Type 2 diabetes mellitus with hyperglycemia: Secondary | ICD-10-CM

## 2017-06-27 DIAGNOSIS — E1065 Type 1 diabetes mellitus with hyperglycemia: Secondary | ICD-10-CM | POA: Diagnosis present

## 2017-06-27 DIAGNOSIS — I129 Hypertensive chronic kidney disease with stage 1 through stage 4 chronic kidney disease, or unspecified chronic kidney disease: Secondary | ICD-10-CM | POA: Diagnosis present

## 2017-06-27 DIAGNOSIS — E10649 Type 1 diabetes mellitus with hypoglycemia without coma: Secondary | ICD-10-CM | POA: Diagnosis present

## 2017-06-27 DIAGNOSIS — E785 Hyperlipidemia, unspecified: Secondary | ICD-10-CM

## 2017-06-27 DIAGNOSIS — F17210 Nicotine dependence, cigarettes, uncomplicated: Secondary | ICD-10-CM | POA: Diagnosis present

## 2017-06-27 DIAGNOSIS — IMO0002 Reserved for concepts with insufficient information to code with codable children: Secondary | ICD-10-CM

## 2017-06-27 DIAGNOSIS — T383X5A Adverse effect of insulin and oral hypoglycemic [antidiabetic] drugs, initial encounter: Secondary | ICD-10-CM | POA: Diagnosis not present

## 2017-06-27 DIAGNOSIS — E1121 Type 2 diabetes mellitus with diabetic nephropathy: Secondary | ICD-10-CM

## 2017-06-27 DIAGNOSIS — I152 Hypertension secondary to endocrine disorders: Secondary | ICD-10-CM | POA: Diagnosis present

## 2017-06-27 DIAGNOSIS — E1169 Type 2 diabetes mellitus with other specified complication: Secondary | ICD-10-CM

## 2017-06-27 DIAGNOSIS — E1159 Type 2 diabetes mellitus with other circulatory complications: Secondary | ICD-10-CM | POA: Diagnosis present

## 2017-06-27 DIAGNOSIS — R680 Hypothermia, not associated with low environmental temperature: Secondary | ICD-10-CM | POA: Diagnosis present

## 2017-06-27 DIAGNOSIS — E161 Other hypoglycemia: Secondary | ICD-10-CM | POA: Diagnosis not present

## 2017-06-27 DIAGNOSIS — E1021 Type 1 diabetes mellitus with diabetic nephropathy: Secondary | ICD-10-CM | POA: Diagnosis present

## 2017-06-27 DIAGNOSIS — E109 Type 1 diabetes mellitus without complications: Secondary | ICD-10-CM | POA: Diagnosis present

## 2017-06-27 DIAGNOSIS — Z9119 Patient's noncompliance with other medical treatment and regimen: Secondary | ICD-10-CM

## 2017-06-27 DIAGNOSIS — E11649 Type 2 diabetes mellitus with hypoglycemia without coma: Secondary | ICD-10-CM | POA: Diagnosis not present

## 2017-06-27 DIAGNOSIS — IMO0001 Reserved for inherently not codable concepts without codable children: Secondary | ICD-10-CM

## 2017-06-27 DIAGNOSIS — E1022 Type 1 diabetes mellitus with diabetic chronic kidney disease: Secondary | ICD-10-CM | POA: Diagnosis present

## 2017-06-27 DIAGNOSIS — R918 Other nonspecific abnormal finding of lung field: Secondary | ICD-10-CM | POA: Diagnosis not present

## 2017-06-27 LAB — COMPREHENSIVE METABOLIC PANEL
ALT: 29 U/L (ref 17–63)
AST: 45 U/L — ABNORMAL HIGH (ref 15–41)
Albumin: 3.6 g/dL (ref 3.5–5.0)
Alkaline Phosphatase: 102 U/L (ref 38–126)
Anion gap: 10 (ref 5–15)
BUN: 29 mg/dL — ABNORMAL HIGH (ref 6–20)
CO2: 21 mmol/L — ABNORMAL LOW (ref 22–32)
Calcium: 9 mg/dL (ref 8.9–10.3)
Chloride: 106 mmol/L (ref 101–111)
Creatinine, Ser: 1.52 mg/dL — ABNORMAL HIGH (ref 0.61–1.24)
GFR calc Af Amer: 51 mL/min — ABNORMAL LOW (ref >60)
GFR calc non Af Amer: 44 mL/min — ABNORMAL LOW (ref >60)
Glucose, Bld: 119 mg/dL — ABNORMAL HIGH (ref 65–99)
Potassium: 4.8 mmol/L (ref 3.5–5.1)
Sodium: 137 mmol/L (ref 135–145)
Total Bilirubin: 0.3 mg/dL (ref 0.3–1.2)
Total Protein: 7.1 g/dL (ref 6.5–8.1)

## 2017-06-27 LAB — CBG MONITORING, ED
Glucose-Capillary: 94 mg/dL (ref 65–99)
Glucose-Capillary: 144 mg/dL — ABNORMAL HIGH (ref 65–99)

## 2017-06-27 LAB — CK: Total CK: 100 U/L (ref 49–397)

## 2017-06-27 LAB — CBC WITH DIFFERENTIAL/PLATELET
Basophils Absolute: 0 10*3/uL (ref 0.0–0.1)
Basophils Relative: 0 %
Eosinophils Absolute: 0.1 10*3/uL (ref 0.0–0.7)
Eosinophils Relative: 1 %
HCT: 32.8 % — ABNORMAL LOW (ref 39.0–52.0)
Hemoglobin: 10.6 g/dL — ABNORMAL LOW (ref 13.0–17.0)
Lymphocytes Relative: 18 %
Lymphs Abs: 1.3 10*3/uL (ref 0.7–4.0)
MCH: 29.5 pg (ref 26.0–34.0)
MCHC: 32.3 g/dL (ref 30.0–36.0)
MCV: 91.4 fL (ref 78.0–100.0)
Monocytes Absolute: 0.5 10*3/uL (ref 0.1–1.0)
Monocytes Relative: 7 %
Neutro Abs: 5.3 10*3/uL (ref 1.7–7.7)
Neutrophils Relative %: 74 %
Platelets: 279 10*3/uL (ref 150–400)
RBC: 3.59 MIL/uL — ABNORMAL LOW (ref 4.22–5.81)
RDW: 15.1 % (ref 11.5–15.5)
WBC: 7.2 10*3/uL (ref 4.0–10.5)

## 2017-06-27 LAB — URINALYSIS, ROUTINE W REFLEX MICROSCOPIC
Bacteria, UA: NONE SEEN
Bilirubin Urine: NEGATIVE
Glucose, UA: 50 mg/dL — AB
Hgb urine dipstick: NEGATIVE
Ketones, ur: NEGATIVE mg/dL
Leukocytes, UA: NEGATIVE
Nitrite: NEGATIVE
Protein, ur: 30 mg/dL — AB
RBC / HPF: NONE SEEN RBC/hpf (ref 0–5)
Specific Gravity, Urine: 1.012 (ref 1.005–1.030)
Squamous Epithelial / LPF: NONE SEEN
pH: 6 (ref 5.0–8.0)

## 2017-06-27 LAB — RAPID URINE DRUG SCREEN, HOSP PERFORMED
Amphetamines: NOT DETECTED
Barbiturates: NOT DETECTED
Benzodiazepines: NOT DETECTED
Cocaine: NOT DETECTED
Opiates: NOT DETECTED
Tetrahydrocannabinol: NOT DETECTED

## 2017-06-27 LAB — TSH: TSH: 1.362 u[IU]/mL (ref 0.350–4.500)

## 2017-06-27 LAB — HEMOGLOBIN A1C
Hgb A1c MFr Bld: 9.9 % — ABNORMAL HIGH (ref 4.8–5.6)
Mean Plasma Glucose: 237.43 mg/dL

## 2017-06-27 LAB — LACTIC ACID, PLASMA: Lactic Acid, Venous: 1.6 mmol/L (ref 0.5–1.9)

## 2017-06-27 LAB — I-STAT CG4 LACTIC ACID, ED: Lactic Acid, Venous: 2.88 mmol/L (ref 0.5–1.9)

## 2017-06-27 LAB — GLUCOSE, CAPILLARY: Glucose-Capillary: 408 mg/dL — ABNORMAL HIGH (ref 65–99)

## 2017-06-27 LAB — PROCALCITONIN: Procalcitonin: 0.44 ng/mL

## 2017-06-27 LAB — ETHANOL: Alcohol, Ethyl (B): <5 mg/dL (ref <5)

## 2017-06-27 MED ORDER — ACETAMINOPHEN 650 MG RE SUPP: 650.0000 mg | Freq: Four times a day (QID) | RECTAL | Status: DC | PRN

## 2017-06-27 MED ORDER — FLUOXETINE HCL 40 MG PO CAPS
40.0000 mg | ORAL_CAPSULE | Freq: Every day | ORAL | Status: DC
  Filled 2017-06-27: qty 1

## 2017-06-27 MED ORDER — ASPIRIN EC 81 MG PO TBEC
81.0000 mg | DELAYED_RELEASE_TABLET | Freq: Every day | ORAL | Status: DC
  Administered 2017-06-27 – 2017-06-30 (×4): 81 mg via ORAL
  Filled 2017-06-27 (×4): qty 1

## 2017-06-27 MED ORDER — DEXTROSE 5 % IV SOLN
INTRAVENOUS | Status: DC
  Administered 2017-06-27 – 2017-06-28 (×2): via INTRAVENOUS

## 2017-06-27 MED ORDER — ASPIRIN EC 81 MG PO TBEC: 81.0000 mg | DELAYED_RELEASE_TABLET | Freq: Every day | ORAL | Status: DC

## 2017-06-27 MED ORDER — ENOXAPARIN SODIUM 40 MG/0.4ML ~~LOC~~ SOLN
40.0000 mg | SUBCUTANEOUS | Status: DC
  Administered 2017-06-27 – 2017-06-29 (×3): 40 mg via SUBCUTANEOUS
  Filled 2017-06-27 (×3): qty 0.4

## 2017-06-27 MED ORDER — INFLUENZA VAC SPLIT HIGH-DOSE 0.5 ML IM SUSY
0.5000 mL | PREFILLED_SYRINGE | INTRAMUSCULAR | Status: AC
  Administered 2017-06-28: 0.5 mL via INTRAMUSCULAR
  Filled 2017-06-27: qty 0.5

## 2017-06-27 MED ORDER — SODIUM CHLORIDE 0.9 % IV BOLUS (SEPSIS)
1000.0000 mL | Freq: Once | INTRAVENOUS | Status: AC
  Administered 2017-06-27: 1000 mL via INTRAVENOUS

## 2017-06-27 MED ORDER — ACETAMINOPHEN 325 MG PO TABS: 650.0000 mg | ORAL_TABLET | Freq: Four times a day (QID) | ORAL | Status: DC | PRN

## 2017-06-27 MED ORDER — SODIUM CHLORIDE 0.9% FLUSH: 3.0000 mL | Freq: Two times a day (BID) | INTRAVENOUS | Status: DC

## 2017-06-27 MED ORDER — FLUOXETINE HCL 20 MG PO CAPS
40.0000 mg | ORAL_CAPSULE | Freq: Every day | ORAL | Status: DC
  Administered 2017-06-27 – 2017-06-30 (×4): 40 mg via ORAL
  Filled 2017-06-27 (×4): qty 2

## 2017-06-27 NOTE — ED Notes (Signed)
Beir hugger applied to pt.

## 2017-06-27 NOTE — Progress Notes (Signed)
New Admission Note:   Arrival Method: Bed Mental Orientation: A&O X4 Telemetry: Initiated Assessment: Completed Skin: See Flowsheets IV: Clean, Dry, Intact Pain: Denies Safety Measures: Safety Fall Prevention Plan has been given, discussed and signed Admission: Completed Unit Orientation: Patient has been orientated to the room, unit and staff.  Family: Daughter at bedside  Orders have been reviewed and implemented. Will continue to monitor the patient. Call light has been placed within reach and bed alarm has been activated.    Dixie Dials RN, BSN

## 2017-06-27 NOTE — ED Provider Notes (Signed)
[facility] DEPT Provider Note   CSN: [id number] Arrival date & time: 06/27/17  1026     History   Chief Complaint Chief Complaint  Patient presents with  . Hypoglycemia    HPI [name] [name] is a 71 y.o. male.  HPI   [name] [name] is a 71 y.o. male, with a history of poorly controlled diabetes, stroke, DKA, hypertension, presenting to the ED with hypoglycemia. Patient's daughter, Karna Christmas, states she came to check on the patient this morning around 9AM and found the patient confused, trembling, and diaphoretic in his bed. EMS found patient's BG to be 15. Pt states he took 5 units of his NovoLog this morning, but did not eat. Terri states patient was normal yesterday when she left his house around 3pm last night. She also spoke to him on the phone at 6AM this morning and pt was speaking normally.    Denies CP, SOB, recent illness, urinary complaints, headache, neck/back pain, abdominal pain, weakness, numbness, fever/chills, or any other complaints.     Past Medical History:  Diagnosis Date  . 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 Closed comminuted intertrochanteric fracture of left femur ([place])   . Diabetes mellitus without complication ([place])   . Diabetic hyperosmolar non-ketotic state ([place]) 05/15/2016  . DKA (diabetic ketoacidoses) ([place]) 05/09/2016  . Hypertension   . Postoperative anemia due to acute blood loss 06/18/2016  . Retroperitoneal hematoma 06/18/2016  . Stroke ([place])   . Vitamin B 12 deficiency 06/18/2016    Patient Active Problem List   Diagnosis Date Noted  . Hypoglycemia due to insulin 06/27/2017  . CKD (chronic kidney disease), stage III 06/27/2017  . Diabetes mellitus, insulin dependent (IDDM), controlled ([place]) 06/27/2017  . History of CVA in adulthood 06/27/2017  . Hypertension 06/27/2017  . Hypothermia 06/27/2017  . Lactic acid increased 06/27/2017  . DKA (diabetic ketoacidoses) ([place]) 05/03/2017  . DKA  (diabetic ketoacidosis) ([place]) 03/22/2017  . Elbow pain, right 03/22/2017  . Abdominal pain due to injury 03/22/2017  . Rib pain on left side 01/29/2017  . Dehydration   . Urinary tract infection without hematuria   . Hyperkalemia 01/16/2017  . Acute metabolic encephalopathy 40/98/1191  . Elevated lactic acid level   . AKI (acute kidney injury) ([place]) 12/14/2016  . Hypoglycemia   . Sepsis ([place]) 12/13/2016  . Uncontrolled type 2 diabetes mellitus with complication ([place])   . Acute renal failure superimposed on stage 3 chronic kidney disease ([place])   . Noncompliance with medication regimen 08/17/2016  . History of DVT (deep vein thrombosis) 07/06/2016  . Retroperitoneal hematoma 06/18/2016  . Vitamin B 12 deficiency 06/18/2016  . Hyperglycemia 05/15/2016  . Anxiety 05/15/2016  . History of CVA (cerebrovascular accident) 05/15/2016  . Hypoglycemia, coma ([place]) 11/02/2015  . Narcotic dependency, continuous ([place]) 11/02/2015  . Uncontrolled type 2 diabetes mellitus with diabetic nephropathy, with long-term current use of insulin ([place]) 08/12/2015  . Depression 08/12/2015  . CKD (chronic kidney disease) stage 3, GFR 30-59 ml/min 03/20/2014    Past Surgical History:  Procedure Laterality Date  . HIP ARTHROPLASTY Right 02/05/2013   Procedure: ARTHROPLASTY BIPOLAR HIP;  Surgeon: [name], MD;  Location: [facility];  Service: Orthopedics;  Laterality: Right;  . INTRAMEDULLARY (IM) NAIL INTERTROCHANTERIC Left 06/16/2016   Procedure: INTRAMEDULLARY (IM) NAIL INTERTROCHANTRIC;  Surgeon: Rod Can, MD;  Location: [facility];  Service: Orthopedics;  Laterality: Left;       Home Medications  Prior to Admission medications   Medication Sig Start Date End Date Taking? Authorizing Provider  acetaminophen (TYLENOL) 325 MG tablet Take 2 tablets (650 mg total) by mouth every 6 (six) hours as needed for mild pain (or Fever >/= 101). 01/20/17  Yes Barton Dubois, MD  amLODipine (NORVASC) 10 MG tablet  Take 1 tablet (10 mg total) by mouth daily. 05/08/17  Yes Eugenie Filler, MD  aspirin 81 MG EC tablet Take 1 tablet (81 mg total) by mouth daily. 01/29/17  Yes [name], [name]  carvedilol (COREG) 6.25 MG tablet Take 1 tablet (6.25 mg total) by mouth 2 (two) times daily with a meal. Patient taking differently: Take 6.25 mg by mouth daily.  01/30/17  Yes [name], [name]  FLUoxetine (PROZAC) 40 MG capsule Take 1 capsule (40 mg total) by mouth daily. 01/30/17  Yes [name], [name]  Insulin Glargine (LANTUS SOLOSTAR) 100 UNIT/ML Solostar Pen Inject 20 Units into the skin every 12 (twelve) hours. Patient taking differently: Inject 5 Units into the skin every 12 (twelve) hours.  05/12/17  Yes [name], [name]  blood glucose meter kit and supplies Dispense based on patient and insurance preference. Use up to four times daily as directed. (FOR ICD-9 250.00, 250.01). Patient not taking: Reported on 06/27/2017 01/20/17   Barton Dubois, MD  blood glucose meter kit and supplies Dispense based on patient and insurance preference. Use up to four times daily as directed. (FOR ICD-9 250.00, 250.01). Patient not taking: Reported on 06/27/2017 05/08/17   Eugenie Filler, MD  feeding supplement, GLUCERNA SHAKE, (GLUCERNA SHAKE) LIQD Take 237 mLs by mouth 2 (two) times daily between meals. Patient not taking: Reported on 06/09/2017 02/01/17   Mariel Aloe, MD  HYDROcodone-acetaminophen (NORCO/VICODIN) 5-325 MG tablet Take 1 tablet by mouth every 6 (six) hours as needed for moderate pain or severe pain. Patient not taking: Reported on 06/09/2017 02/01/17   Mariel Aloe, MD  insulin aspart (NOVOLOG) 100 UNIT/ML FlexPen Inject 5 Units into the skin 3 (three) times daily with meals. For blood sugars greater than 150 only Patient not taking: Reported on 06/09/2017 05/12/17   [name], [name]  Insulin Pen Needle 29G X 10MM MISC To attached to insulin pen and administer medication as  prescribed. Patient not taking: Reported on 06/27/2017 05/12/17   [name], [name]  Insulin Syringe-Needle U-100 (INSULIN SYRINGE .3CC/31GX5/16") 31G X 5/16" 0.3 ML MISC 5 Units by Does not apply route 2 (two) times daily. Patient not taking: Reported on 06/27/2017 05/08/17   Eugenie Filler, MD  traMADol-acetaminophen (ULTRACET) 37.5-325 MG tablet Take 1 tablet by mouth every 8 (eight) hours as needed for severe pain. Patient not taking: Reported on 06/09/2017 05/12/17   [name], [name]  vitamin B-12 (CYANOCOBALAMIN) 1000 MCG tablet Take 1 tablet (1,000 mcg total) by mouth daily. Patient not taking: Reported on 06/09/2017 01/20/17   Barton Dubois, MD    Family History Family History  Problem Relation Age of Onset  . Diabetes Mother   . Alzheimer's disease Mother   . Hypertension Mother   . Hyperlipidemia Father   . Hypertension Father   . Healthy Maternal Grandmother   . Pneumonia Maternal Grandfather     Social History Social History  Substance Use Topics  . Smoking status: Current Some Day Smoker    Packs/day: 0.25    Years: 30.00    Types: Cigarettes    Last attempt to quit: 01/31/2013  .  Smokeless tobacco: Never Used     Comment: "some days I light one", requests a nicotine patch  . Alcohol use No     Allergies   Patient has no known allergies.   Review of Systems Review of Systems  Constitutional: Negative for chills and fever.  Respiratory: Negative for shortness of breath.   Cardiovascular: Negative for chest pain.  Gastrointestinal: Negative for abdominal pain, diarrhea, nausea and vomiting.  Endocrine:       Hypoglycemia  Musculoskeletal: Negative for back pain and neck pain.  Neurological: Negative for dizziness, weakness, light-headedness, numbness and headaches.  Psychiatric/Behavioral: Positive for confusion.  All other systems reviewed and are negative.    Physical Exam Updated Vital Signs BP (!) 148/129   Pulse (!) 58   Temp (!)  94.9 F (34.9 C) (Oral)   Resp (!) 25   Ht _0  (1.753 m)   Wt 61.2 kg (135 lb)   SpO2 99%   BMI 19.94 kg/m   Physical Exam  Constitutional: He is oriented to person, place, and time. He appears well-developed and well-nourished. No distress.  HENT:  Head: Normocephalic and atraumatic.  Mouth/Throat: Oropharynx is clear and moist.  Eyes: Pupils are equal, round, and reactive to light. Conjunctivae and EOM are normal.  Neck: Normal range of motion. Neck supple.  Cardiovascular: Normal rate, regular rhythm, normal heart sounds and intact distal pulses.   Pulmonary/Chest: Effort normal and breath sounds normal. No respiratory distress.  Abdominal: Soft. There is no tenderness. There is no guarding.  Musculoskeletal: He exhibits no edema.  Normal motor function intact in all extremities and spine. No midline spinal tenderness.   Lymphadenopathy:    He has no cervical adenopathy.  Neurological: He is alert and oriented to person, place, and time.  No sensory deficits.  No noted speech deficits. No aphasia. Patient handles oral secretions without difficulty. No noted swallowing defects.  Equal grip strength bilaterally. Strength 5/5 in the upper extremities. Strength 5/5 in the lower extremities.  No upright ataxia. Coordination intact. Cranial nerves III-XII grossly intact.  No facial droop.   Skin: Skin is warm and dry. He is not diaphoretic.  Psychiatric: He has a normal mood and affect. His behavior is normal.  Nursing note and vitals reviewed.    ED Treatments / Results  Labs (all labs ordered are listed, but only abnormal results are displayed) Labs Reviewed  CBC WITH DIFFERENTIAL/PLATELET - Abnormal; Notable for the following:       Result Value   RBC 3.59 (*)    Hemoglobin 10.6 (*)    HCT 32.8 (*)    All other components within normal limits  COMPREHENSIVE METABOLIC PANEL - Abnormal; Notable for the following:    CO2 21 (*)    Glucose, Bld 119 (*)    BUN 29 (*)     Creatinine, Ser 1.52 (*)    AST 45 (*)    GFR calc non Af Amer 44 (*)    GFR calc Af Amer 51 (*)    All other components within normal limits  CBG MONITORING, ED - Abnormal; Notable for the following:    Glucose-Capillary 144 (*)    All other components within normal limits  I-STAT CG4 LACTIC ACID, ED - Abnormal; Notable for the following:    Lactic Acid, Venous 2.88 (*)    All other components within normal limits  CULTURE, BLOOD (ROUTINE X 2)  CULTURE, BLOOD (ROUTINE X 2)  ETHANOL  URINALYSIS, ROUTINE W REFLEX  MICROSCOPIC  RAPID URINE DRUG SCREEN, HOSP PERFORMED  TSH  CK  PROCALCITONIN  C-PEPTIDE  HEMOGLOBIN A1C  CBG MONITORING, ED   BUN  Date Value Ref Range Status  06/27/2017 29 (H) 6 - 20 mg/dL Final  06/09/2017 30 (H) 6 - 20 mg/dL Final  05/08/2017 37 (H) 6 - 20 mg/dL Final  05/07/2017 28 (H) 6 - 20 mg/dL Final   Creatinine, Ser  Date Value Ref Range Status  06/27/2017 1.52 (H) 0.61 - 1.24 mg/dL Final  06/09/2017 1.41 (H) 0.61 - 1.24 mg/dL Final  05/08/2017 1.44 (H) 0.61 - 1.24 mg/dL Final  05/07/2017 1.59 (H) 0.61 - 1.24 mg/dL Final     EKG  EKG Interpretation None       Radiology Dg Chest Portable 1 View  Result Date: 06/27/2017 CLINICAL DATA:  71 year old male - evaluate source of infection. EXAM: PORTABLE CHEST 1 VIEW COMPARISON:  04/27/2017 and prior radiographs FINDINGS: The cardiomediastinal silhouette is unremarkable. There is no evidence of focal airspace disease, pulmonary edema, suspicious pulmonary nodule/mass, pleural effusion, or pneumothorax. No acute bony abnormalities are identified. IMPRESSION: No active disease. Electronically Signed   By: Margarette Canada M.D.   On: 06/27/2017 14:17    Procedures Procedures (including critical care time)  Medications Ordered in ED Medications  dextrose 5 % solution (not administered)  sodium chloride 0.9 % bolus 1,000 mL (1,000 mLs Intravenous New Bag/Given 06/27/17 1342)     Initial Impression /  Assessment and Plan / ED Course  I have reviewed the triage vital signs and the nursing notes.  Pertinent labs & imaging results that were available during my care of the patient were reviewed by me and considered in my medical decision making (see chart for details).  Clinical Course as of Jun 27 1516  Sat Jun 27, 2017  1430 Consistent with previous. BUN: (!) 29 [SJ]  1515 Spoke with Erin Hearing with hospitalist team who agrees to admit the patient.   [SJ]    Clinical Course User Index [SJ] Katisha Shimizu C, PA-C    Patient presents following an episode of hypoglycemia. Patient also noted to be hypothermic upon arrival. No injuries or neuro deficits noted. Lactic acidosis noted. Small increase in creatinine. Admission due to hypoglycemic episode and subsequent hypothermia. UA and UDS pending upon admission.   Findings and plan of care discussed with Zenovia Jarred, MD. Dr. Thomasene Lot personally evaluated and examined this patient.  Vitals:   06/27/17 1145 06/27/17 1343 06/27/17 1345 06/27/17 1403  BP: 118/63 124/70 124/77   Pulse: 64 72 69 66  Resp: (!) 25 (!) 23 (!) 23 (!) 25  Temp:    98.6 F (37 C)  TempSrc:    Oral  SpO2: 99% 99% 99% 99%  Weight:      Height:         Final Clinical Impressions(s) / ED Diagnoses   Final diagnoses:  Hypoglycemia  Hypothermia, initial encounter    New Prescriptions New Prescriptions   No medications on file     Layla Maw 06/27/17 1517    Macarthur Critchley, MD 06/28/17 1136

## 2017-06-27 NOTE — Progress Notes (Signed)
Orders to check CBG q2h. MD notified. Orders changed to CBG monitoring q4h. Will continue to monitor.

## 2017-06-27 NOTE — ED Notes (Signed)
Report given to [facility].

## 2017-06-27 NOTE — H&P (Signed)
History and Physical    [name] [name] EXN:[id number] DOB: June 26, 1946 DOA: 06/27/2017   PCP: [name], [name]   Attending physician: Roel Cluck  Patient coming from/Resides with: Private residence  Chief Complaint: Acute hypoglycemia  HPI: [name] [name] is a 71 y.o. male with medical history significant for hypertension, prior CVA, brittle insulin dependent diabetes and stage III chronic kidney disease. Patient was recently hospitalized in July with DKA and UTI. Patient has a history of noncompliance with diabetes medications and poorly controlled diabetes in the past. Patient presented to the ER today with acute hypoglycemia and hypothermia after taking his insulin but forgetting to eat. His initial CBG was 15. He was hypothermic with an oral temperature of 94.9. He had a mildly elevated lactic acid of 2.88. He was given normal saline 1 L 1 in the ER. Her to arrival he was given D50 by EMS. Glucose electrolyte panel 119.  ED Course:  Vital Signs: BP 124/77   Pulse 66   Temp 98.6 F (37 C) (Oral)   Resp (!) 25   Ht '5\' 9"'  (1.753 m)   Wt 61.2 kg (135 lb)   SpO2 99%   BMI 19.94 kg/m  PCXR: No acute process Lab data: Sodium 137, potassium 4.8, chloride 106, CO2 21, glucose 119, BUN 29, creatinine 1.52, AST 45, lactic acid 2.88, white count 7200, hemoglobin 10.6, platelets 279,000, ETOH < 5, blood cultures obtained Medications and treatments: Normal saline 1 L  Review of Systems:  In addition to the HPI above,  No Fever-chills, myalgias or other constitutional symptoms No Headache, changes with Vision or hearing, new weakness, tingling, numbness in any extremity, dizziness, dysarthria or word finding difficulty, gait disturbance or imbalance, tremors or seizure activity No problems swallowing food or Liquids, indigestion/reflux, choking or coughing while eating, abdominal pain with or after eating No Chest pain, Cough or Shortness of Breath, palpitations, orthopnea or DOE No Abdominal  pain, N/V, melena,hematochezia, dark tarry stools, constipation No dysuria, malodorous urine, hematuria or flank pain No new skin rashes, lesions, masses or bruises, No new joint pains, aches, swelling or redness No recent unintentional weight gain or loss No polyuria, polydypsia or polyphagia   Past Medical History:  Diagnosis Date  . 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 Closed comminuted intertrochanteric fracture of left femur ([place])   . Diabetes mellitus without complication ([place])   . Diabetic hyperosmolar non-ketotic state ([place]) 05/15/2016  . DKA (diabetic ketoacidoses) ([place]) 05/09/2016  . Hypertension   . Postoperative anemia due to acute blood loss 06/18/2016  . Retroperitoneal hematoma 06/18/2016  . Stroke ([place])   . Vitamin B 12 deficiency 06/18/2016    Past Surgical History:  Procedure Laterality Date  . HIP ARTHROPLASTY Right 02/05/2013   Procedure: ARTHROPLASTY BIPOLAR HIP;  Surgeon: [name], MD;  Location: [facility];  Service: Orthopedics;  Laterality: Right;  . INTRAMEDULLARY (IM) NAIL INTERTROCHANTERIC Left 06/16/2016   Procedure: INTRAMEDULLARY (IM) NAIL INTERTROCHANTRIC;  Surgeon: Rod Can, MD;  Location: [facility];  Service: Orthopedics;  Laterality: Left;    Social History   Social History  . Marital status: Single    Spouse name: N/A  . Number of children: 2  . Years of education: 13   Occupational History  . Retired    Social History Main Topics  . Smoking status: Current Some Day Smoker    Packs/day: 0.25    Years: 30.00    Types: Cigarettes  Last attempt to quit: 01/31/2013  . Smokeless tobacco: Never Used     Comment: "some days I light one", requests a nicotine patch  . Alcohol use No  . Drug use: No  . Sexual activity: No   Other Topics Concern  . Not on file   Social History Narrative   Fun: Likes to do handyman related projects, photography.    Denies religious beliefs effecting  health care.     Mobility: RW Work history: N/A   No Known Allergies  Family History  Problem Relation Age of Onset  . Diabetes Mother   . Alzheimer's disease Mother   . Hypertension Mother   . Hyperlipidemia Father   . Hypertension Father   . Healthy Maternal Grandmother   . Pneumonia Maternal Grandfather     Prior to Admission medications   Medication Sig Start Date End Date Taking? Authorizing Provider  acetaminophen (TYLENOL) 325 MG tablet Take 2 tablets (650 mg total) by mouth every 6 (six) hours as needed for mild pain (or Fever >/= 101). 01/20/17  Yes Barton Dubois, MD  amLODipine (NORVASC) 10 MG tablet Take 1 tablet (10 mg total) by mouth daily. 05/08/17  Yes Eugenie Filler, MD  aspirin 81 MG EC tablet Take 1 tablet (81 mg total) by mouth daily. 01/29/17  Yes [name], [name]  carvedilol (COREG) 6.25 MG tablet Take 1 tablet (6.25 mg total) by mouth 2 (two) times daily with a meal. Patient taking differently: Take 6.25 mg by mouth daily.  01/30/17  Yes [name], [name]  FLUoxetine (PROZAC) 40 MG capsule Take 1 capsule (40 mg total) by mouth daily. 01/30/17  Yes [name], [name]  Insulin Glargine (LANTUS SOLOSTAR) 100 UNIT/ML Solostar Pen Inject 20 Units into the skin every 12 (twelve) hours. Patient taking differently: Inject 5 Units into the skin every 12 (twelve) hours.  05/12/17  Yes [name], [name]  blood glucose meter kit and supplies Dispense based on patient and insurance preference. Use up to four times daily as directed. (FOR ICD-9 250.00, 250.01). Patient not taking: Reported on 06/27/2017 01/20/17   Barton Dubois, MD  blood glucose meter kit and supplies Dispense based on patient and insurance preference. Use up to four times daily as directed. (FOR ICD-9 250.00, 250.01). Patient not taking: Reported on 06/27/2017 05/08/17   Eugenie Filler, MD  feeding supplement, GLUCERNA SHAKE, (GLUCERNA SHAKE) LIQD Take 237 mLs by mouth 2 (two) times  daily between meals. Patient not taking: Reported on 06/09/2017 02/01/17   Mariel Aloe, MD  HYDROcodone-acetaminophen (NORCO/VICODIN) 5-325 MG tablet Take 1 tablet by mouth every 6 (six) hours as needed for moderate pain or severe pain. Patient not taking: Reported on 06/09/2017 02/01/17   Mariel Aloe, MD  insulin aspart (NOVOLOG) 100 UNIT/ML FlexPen Inject 5 Units into the skin 3 (three) times daily with meals. For blood sugars greater than 150 only Patient not taking: Reported on 06/09/2017 05/12/17   [name], [name]  Insulin Pen Needle 29G X 10MM MISC To attached to insulin pen and administer medication as prescribed. Patient not taking: Reported on 06/27/2017 05/12/17   [name], [name]  Insulin Syringe-Needle U-100 (INSULIN SYRINGE .3CC/31GX5/16") 31G X 5/16" 0.3 ML MISC 5 Units by Does not apply route 2 (two) times daily. Patient not taking: Reported on 06/27/2017 05/08/17   Eugenie Filler, MD  traMADol-acetaminophen (ULTRACET) 37.5-325 MG tablet Take 1 tablet by mouth every 8 (eight) hours as  needed for severe pain. Patient not taking: Reported on 06/09/2017 05/12/17   [name], [name]  vitamin B-12 (CYANOCOBALAMIN) 1000 MCG tablet Take 1 tablet (1,000 mcg total) by mouth daily. Patient not taking: Reported on 06/09/2017 01/20/17   Barton Dubois, MD    Physical Exam: Vitals:   06/27/17 1145 06/27/17 1343 06/27/17 1345 06/27/17 1403  BP: 118/63 124/70 124/77   Pulse: 64 72 69 66  Resp: (!) 25 (!) 23 (!) 23 (!) 25  Temp:    98.6 F (37 C)  TempSrc:    Oral  SpO2: 99% 99% 99% 99%  Weight:      Height:          Constitutional: NAD, calm, comfortable Eyes: PERRL, lids and conjunctivae normal ENMT: Mucous membranes are moist. Posterior pharynx clear of any exudate or lesions. Poor dentition.  Neck: normal, supple, no masses, no thyromegaly Respiratory: clear to auscultation bilaterally, no wheezing, no crackles. Normal respiratory effort. No accessory muscle  use.  Cardiovascular: Regular rate and rhythm, no murmurs / rubs / gallops. No extremity edema. 2+ pedal pulses. No carotid bruits.  Abdomen: no tenderness, no masses palpated. No hepatosplenomegaly. Bowel sounds positive.  Musculoskeletal: no clubbing / cyanosis. No joint deformity upper and lower extremities. Good ROM, no contractures. Normal muscle tone.  Skin: no rashes, lesions, ulcers. No induration Neurologic: CN 2-12 grossly intact. Sensation intact, DTR normal. Strength 5/5 x all 4 extremities.  Psychiatric: Alert and oriented x 3. Normal mood.    Labs on Admission: I have personally reviewed following labs and imaging studies  CBC:  Recent Labs Lab 06/27/17 1230  WBC 7.2  NEUTROABS 5.3  HGB 10.6*  HCT 32.8*  MCV 91.4  PLT 871   Basic Metabolic Panel:  Recent Labs Lab 06/27/17 1320  NA 137  K 4.8  CL 106  CO2 21*  GLUCOSE 119*  BUN 29*  CREATININE 1.52*  CALCIUM 9.0   GFR: Estimated Creatinine Clearance: 38 mL/min (A) (by C-G formula based on SCr of 1.52 mg/dL (H)). Liver Function Tests:  Recent Labs Lab 06/27/17 1320  AST 45*  ALT 29  ALKPHOS 102  BILITOT 0.3  PROT 7.1  ALBUMIN 3.6   No results for input(s): LIPASE, AMYLASE in the last 168 hours. No results for input(s): AMMONIA in the last 168 hours. Coagulation Profile: No results for input(s): INR, PROTIME in the last 168 hours. Cardiac Enzymes: No results for input(s): CKTOTAL, CKMB, CKMBINDEX, TROPONINI in the last 168 hours. BNP (last 3 results) No results for input(s): PROBNP in the last 8760 hours. HbA1C: No results for input(s): HGBA1C in the last 72 hours. CBG:  Recent Labs Lab 06/27/17 1033 06/27/17 1215  GLUCAP 94 144*   Lipid Profile: No results for input(s): CHOL, HDL, LDLCALC, TRIG, CHOLHDL, LDLDIRECT in the last 72 hours. Thyroid Function Tests: No results for input(s): TSH, T4TOTAL, FREET4, T3FREE, THYROIDAB in the last 72 hours. Anemia Panel: No results for  input(s): VITAMINB12, FOLATE, FERRITIN, TIBC, IRON, RETICCTPCT in the last 72 hours. Urine analysis:    Component Value Date/Time   COLORURINE YELLOW 05/03/2017 0819   APPEARANCEUR HAZY (A) 05/03/2017 0819   LABSPEC 1.010 05/12/2017 0939   PHURINE 7.0 05/12/2017 0939   GLUCOSEU NEGATIVE 05/12/2017 0939   HGBUR NEGATIVE 05/12/2017 0939   BILIRUBINUR NEGATIVE 05/12/2017 0939   KETONESUR NEGATIVE 05/12/2017 0939   PROTEINUR 30 (A) 05/12/2017 0939   UROBILINOGEN 0.2 05/12/2017 0939   NITRITE NEGATIVE 05/12/2017 0939   LEUKOCYTESUR  NEGATIVE 05/12/2017 0939   Sepsis Labs: '@LABRCNTIP' (procalcitonin:4,lacticidven:4) )No results found for this or any previous visit (from the past 240 hour(s)).   Radiological Exams on Admission: Dg Chest Portable 1 View  Result Date: 06/27/2017 CLINICAL DATA:  71 year old male - evaluate source of infection. EXAM: PORTABLE CHEST 1 VIEW COMPARISON:  04/27/2017 and prior radiographs FINDINGS: The cardiomediastinal silhouette is unremarkable. There is no evidence of focal airspace disease, pulmonary edema, suspicious pulmonary nodule/mass, pleural effusion, or pneumothorax. No acute bony abnormalities are identified. IMPRESSION: No active disease. Electronically Signed   By: Margarette Canada M.D.   On: 06/27/2017 14:17    EKG: (Independently reviewed) Sinus rhythm with ventricle rate 66 bpm, QTC 443 ms, normal R-wave rotation, prominent T waves in V3 and V4, downsloping ST segment in lead 3, no acute ischemic changes  Assessment/Plan Principal Problem:   Hypoglycemia due to insulin/  Diabetes mellitus, insulin dependent (IDDM), controlled  -Patient presents with symptomatic hypoglycemia with initial CBG 15 after taking insulin but forgetting to eat -History of poorly controlled/brittle diabetes secondary to noncompliance in the past with hemoglobin A1c 11.6 in July -Patient reports has been taking insulin regularly since last discharge and states CBGs typically run  between 240 and 280 -Hold Lantus for now -Follow CBGs every 2 hours -D5W infusion at 100/hr -Carb modified diet -Hemoglobin A1c and C-peptide level  Active Problems:   Hypothermia -Secondary to profound hypoglycemia -Resolved    CKD (chronic kidney disease), stage III -Renal function stable and at baseline    Hypertension -Blood pressure somewhat suboptimal in setting of acute hypoglycemia and hypothermia -Hold preadmission carvedilol and Norvasc    Lactic acid increased -Likely secondary to hypoperfusion in setting of acute hypoglycemia and associated hypothermia -Trend lactic acid -No reported infectious signs but agree with blood cultures obtained by ER-urinalysis and culture pending -Procalcitonin    History of CVA in adulthood -Chart notes patient has guardian -Patient reports lives alone and uses a rolling walker to mobilize      DVT prophylaxis: Lovenox Code Status: Full  Family Communication: No family at bedside Disposition Plan: Home Consults called: None     Shaliyah Taite L. ANP-BC [facility] Pager [phone]   If 7PM-7AM, please contact night-coverage [email or web address] Password [email or web address]  06/27/2017, 3:30 PM

## 2017-06-27 NOTE — ED Notes (Signed)
Pt given orange juice.

## 2017-06-27 NOTE — ED Triage Notes (Signed)
Pt to ED from home--- pt lives alone-- with initial cbg was 15 -- received D50 x 1amp enroute-- pt has a 20G left EJ-- pt is alert/oriented to self, and birthdate.

## 2017-06-27 NOTE — ED Notes (Signed)
Eating a fish sandwich from [employer] that daughter brought to him. Remains alert.

## 2017-06-28 DIAGNOSIS — Z8673 Personal history of transient ischemic attack (TIA), and cerebral infarction without residual deficits: Secondary | ICD-10-CM | POA: Diagnosis not present

## 2017-06-28 DIAGNOSIS — E1022 Type 1 diabetes mellitus with diabetic chronic kidney disease: Secondary | ICD-10-CM | POA: Diagnosis present

## 2017-06-28 DIAGNOSIS — Z96641 Presence of right artificial hip joint: Secondary | ICD-10-CM | POA: Diagnosis present

## 2017-06-28 DIAGNOSIS — E1121 Type 2 diabetes mellitus with diabetic nephropathy: Secondary | ICD-10-CM | POA: Diagnosis not present

## 2017-06-28 DIAGNOSIS — I10 Essential (primary) hypertension: Secondary | ICD-10-CM | POA: Diagnosis not present

## 2017-06-28 DIAGNOSIS — E109 Type 1 diabetes mellitus without complications: Secondary | ICD-10-CM | POA: Diagnosis present

## 2017-06-28 DIAGNOSIS — E1021 Type 1 diabetes mellitus with diabetic nephropathy: Secondary | ICD-10-CM | POA: Diagnosis present

## 2017-06-28 DIAGNOSIS — Z9119 Patient's noncompliance with other medical treatment and regimen: Secondary | ICD-10-CM | POA: Diagnosis not present

## 2017-06-28 DIAGNOSIS — E162 Hypoglycemia, unspecified: Secondary | ICD-10-CM | POA: Diagnosis not present

## 2017-06-28 DIAGNOSIS — E10649 Type 1 diabetes mellitus with hypoglycemia without coma: Secondary | ICD-10-CM | POA: Diagnosis present

## 2017-06-28 DIAGNOSIS — E872 Acidosis: Secondary | ICD-10-CM | POA: Diagnosis not present

## 2017-06-28 DIAGNOSIS — Z79899 Other long term (current) drug therapy: Secondary | ICD-10-CM | POA: Diagnosis not present

## 2017-06-28 DIAGNOSIS — E1065 Type 1 diabetes mellitus with hyperglycemia: Secondary | ICD-10-CM | POA: Diagnosis present

## 2017-06-28 DIAGNOSIS — D638 Anemia in other chronic diseases classified elsewhere: Secondary | ICD-10-CM | POA: Diagnosis present

## 2017-06-28 DIAGNOSIS — R680 Hypothermia, not associated with low environmental temperature: Secondary | ICD-10-CM | POA: Diagnosis present

## 2017-06-28 DIAGNOSIS — N183 Chronic kidney disease, stage 3 (moderate): Secondary | ICD-10-CM | POA: Diagnosis not present

## 2017-06-28 DIAGNOSIS — T68XXXA Hypothermia, initial encounter: Secondary | ICD-10-CM | POA: Diagnosis not present

## 2017-06-28 DIAGNOSIS — Z7982 Long term (current) use of aspirin: Secondary | ICD-10-CM | POA: Diagnosis not present

## 2017-06-28 DIAGNOSIS — F17210 Nicotine dependence, cigarettes, uncomplicated: Secondary | ICD-10-CM | POA: Diagnosis present

## 2017-06-28 DIAGNOSIS — Z23 Encounter for immunization: Secondary | ICD-10-CM | POA: Diagnosis not present

## 2017-06-28 DIAGNOSIS — E160 Drug-induced hypoglycemia without coma: Secondary | ICD-10-CM | POA: Diagnosis not present

## 2017-06-28 DIAGNOSIS — E871 Hypo-osmolality and hyponatremia: Secondary | ICD-10-CM | POA: Diagnosis present

## 2017-06-28 DIAGNOSIS — Z9114 Patient's other noncompliance with medication regimen: Secondary | ICD-10-CM | POA: Diagnosis not present

## 2017-06-28 DIAGNOSIS — T383X5A Adverse effect of insulin and oral hypoglycemic [antidiabetic] drugs, initial encounter: Secondary | ICD-10-CM | POA: Diagnosis not present

## 2017-06-28 DIAGNOSIS — E119 Type 2 diabetes mellitus without complications: Secondary | ICD-10-CM | POA: Diagnosis not present

## 2017-06-28 DIAGNOSIS — I129 Hypertensive chronic kidney disease with stage 1 through stage 4 chronic kidney disease, or unspecified chronic kidney disease: Secondary | ICD-10-CM | POA: Diagnosis present

## 2017-06-28 DIAGNOSIS — Z794 Long term (current) use of insulin: Secondary | ICD-10-CM | POA: Diagnosis not present

## 2017-06-28 DIAGNOSIS — E1165 Type 2 diabetes mellitus with hyperglycemia: Secondary | ICD-10-CM

## 2017-06-28 LAB — CBC WITH DIFFERENTIAL/PLATELET
Basophils Absolute: 0 10*3/uL (ref 0.0–0.1)
Basophils Relative: 0 %
Eosinophils Absolute: 0.3 10*3/uL (ref 0.0–0.7)
Eosinophils Relative: 3 %
HCT: 28.9 % — ABNORMAL LOW (ref 39.0–52.0)
Hemoglobin: 9.6 g/dL — ABNORMAL LOW (ref 13.0–17.0)
Lymphocytes Relative: 28 %
Lymphs Abs: 2.9 10*3/uL (ref 0.7–4.0)
MCH: 29.7 pg (ref 26.0–34.0)
MCHC: 33.2 g/dL (ref 30.0–36.0)
MCV: 89.5 fL (ref 78.0–100.0)
Monocytes Absolute: 0.9 10*3/uL (ref 0.1–1.0)
Monocytes Relative: 9 %
Neutro Abs: 6.1 10*3/uL (ref 1.7–7.7)
Neutrophils Relative %: 60 %
Platelets: 213 10*3/uL (ref 150–400)
RBC: 3.23 MIL/uL — ABNORMAL LOW (ref 4.22–5.81)
RDW: 13.9 % (ref 11.5–15.5)
WBC: 10.3 10*3/uL (ref 4.0–10.5)

## 2017-06-28 LAB — COMPREHENSIVE METABOLIC PANEL
ALT: 29 U/L (ref 17–63)
AST: 37 U/L (ref 15–41)
Albumin: 3.1 g/dL — ABNORMAL LOW (ref 3.5–5.0)
Alkaline Phosphatase: 102 U/L (ref 38–126)
Anion gap: 5 (ref 5–15)
BUN: 24 mg/dL — ABNORMAL HIGH (ref 6–20)
CO2: 24 mmol/L (ref 22–32)
Calcium: 8.5 mg/dL — ABNORMAL LOW (ref 8.9–10.3)
Chloride: 106 mmol/L (ref 101–111)
Creatinine, Ser: 1.27 mg/dL — ABNORMAL HIGH (ref 0.61–1.24)
GFR calc Af Amer: >60 mL/min (ref >60)
GFR calc non Af Amer: 55 mL/min — ABNORMAL LOW (ref >60)
Glucose, Bld: 225 mg/dL — ABNORMAL HIGH (ref 65–99)
Potassium: 4.1 mmol/L (ref 3.5–5.1)
Sodium: 135 mmol/L (ref 135–145)
Total Bilirubin: 0.4 mg/dL (ref 0.3–1.2)
Total Protein: 6.3 g/dL — ABNORMAL LOW (ref 6.5–8.1)

## 2017-06-28 LAB — GLUCOSE, CAPILLARY
Glucose-Capillary: 123 mg/dL — ABNORMAL HIGH (ref 65–99)
Glucose-Capillary: 141 mg/dL — ABNORMAL HIGH (ref 65–99)
Glucose-Capillary: 263 mg/dL — ABNORMAL HIGH (ref 65–99)
Glucose-Capillary: 345 mg/dL — ABNORMAL HIGH (ref 65–99)
Glucose-Capillary: 455 mg/dL — ABNORMAL HIGH (ref 65–99)
Glucose-Capillary: >600 mg/dL (ref 65–99)
Glucose-Capillary: >600 mg/dL (ref 65–99)

## 2017-06-28 LAB — PHOSPHORUS: Phosphorus: 3 mg/dL (ref 2.5–4.6)

## 2017-06-28 LAB — MAGNESIUM: Magnesium: 2.2 mg/dL (ref 1.7–2.4)

## 2017-06-28 MED ORDER — INSULIN ASPART 100 UNIT/ML ~~LOC~~ SOLN
0.0000 [IU] | Freq: Three times a day (TID) | SUBCUTANEOUS | Status: DC
  Administered 2017-06-28: 1 [IU] via SUBCUTANEOUS
  Administered 2017-06-28: 9 [IU] via SUBCUTANEOUS
  Administered 2017-06-29: 7 [IU] via SUBCUTANEOUS
  Administered 2017-06-29: 9 [IU] via SUBCUTANEOUS
  Administered 2017-06-30: 2 [IU] via SUBCUTANEOUS

## 2017-06-28 MED ORDER — AMLODIPINE BESYLATE 10 MG PO TABS
10.0000 mg | ORAL_TABLET | Freq: Every day | ORAL | Status: DC
  Administered 2017-06-28 – 2017-06-30 (×3): 10 mg via ORAL
  Filled 2017-06-28 (×3): qty 1

## 2017-06-28 MED ORDER — CARVEDILOL 6.25 MG PO TABS
6.2500 mg | ORAL_TABLET | Freq: Two times a day (BID) | ORAL | Status: DC
  Administered 2017-06-28 – 2017-06-30 (×4): 6.25 mg via ORAL
  Filled 2017-06-28 (×4): qty 1

## 2017-06-28 MED ORDER — INSULIN ASPART 100 UNIT/ML ~~LOC~~ SOLN
3.0000 [IU] | Freq: Three times a day (TID) | SUBCUTANEOUS | Status: DC
  Administered 2017-06-28 – 2017-06-29 (×3): 3 [IU] via SUBCUTANEOUS

## 2017-06-28 MED ORDER — INSULIN ASPART 100 UNIT/ML ~~LOC~~ SOLN
12.0000 [IU] | Freq: Once | SUBCUTANEOUS | Status: AC
  Administered 2017-06-28: 12 [IU] via SUBCUTANEOUS

## 2017-06-28 MED ORDER — INSULIN ASPART 100 UNIT/ML ~~LOC~~ SOLN
0.0000 [IU] | Freq: Every day | SUBCUTANEOUS | Status: DC
  Administered 2017-06-28: 3 [IU] via SUBCUTANEOUS

## 2017-06-28 MED ORDER — SODIUM CHLORIDE 0.9 % IV SOLN
INTRAVENOUS | Status: DC
  Administered 2017-06-28 – 2017-06-29 (×2): via INTRAVENOUS

## 2017-06-28 MED ORDER — INSULIN GLARGINE 100 UNIT/ML ~~LOC~~ SOLN
7.0000 [IU] | Freq: Every day | SUBCUTANEOUS | Status: DC
  Administered 2017-06-29: 7 [IU] via SUBCUTANEOUS
  Filled 2017-06-28 (×2): qty 0.07

## 2017-06-28 NOTE — Progress Notes (Signed)
CRITICAL VALUE ALERT  Critical Value: CBG:455  Date & Time Notied: 06/28/17 1130  Provider Notified: Alfredia Ferguson  Orders Received/Actions taken: MD stated to give 9 units of Novolog Insulin. Orders followed. Will continue to monitor.

## 2017-06-28 NOTE — Progress Notes (Signed)
Patient CBG is 408. Jeannette Corpus, NP notified. Will continue to monitor.

## 2017-06-28 NOTE — Progress Notes (Signed)
CBG is greater than 600. Jeannette Corpus, NP notified. Will give Novolog 12units sq and recheck CBG in 1hr as ordered.

## 2017-06-28 NOTE — Progress Notes (Signed)
PROGRESS NOTE    [name]  PJK:[id number] DOB: [birth year] DOA: 06/27/2017 PCP: [name], [name]   Brief Narrative:  [name] [name] is a 71 y.o. male with medical history significant for hypertension, prior CVA, brittle insulin dependent diabetes and stage III chronic kidney disease. Patient was recently hospitalized in July with DKA and UTI. Patient has a history of noncompliance with diabetes medications and poorly controlled diabetes in the past. Patient presented to the ER today with acute hypoglycemia and hypothermia after taking his insulin (likely double dose) but forgetting to eat. His initial CBG was 15. He was hypothermic with an oral temperature and admitted for further evaluation and workup.   Assessment & Plan:   Principal Problem:   Hypoglycemia due to insulin Active Problems:   Uncontrolled type 2 diabetes mellitus with diabetic nephropathy, with long-term current use of insulin ([name])   Hypoglycemia   CKD (chronic kidney disease), stage III   Diabetes mellitus, insulin dependent (IDDM), controlled ([place])   History of CVA in adulthood   Hypertension   Hypothermia   Lactic acid increased   Hypoglycemia due to Insulin/ Diabetes Mellitus, Insulin dependent (IDDM), Brittle -Patient presentedwith symptomatic hypoglycemia with initial CBG 15 after taking insulin (double dose of Novolog) but forgetting to eat -History of poorly controlled/Brittle diabetes secondary to noncompliance in the past with hemoglobin A1c 11.6 in July; -Diabetes Education Coordinator consulted -Patient reports has been taking insulin regularly since last discharge and states CBGs typically run between 240 and 280 -Follow CBGs every 2 hours -D5W infusion at 100/hr now D/C'd -C/w Carb Modified Diet -Repeat Hemoglobin A1c was 9.9 and C-peptide Level pending; Last C-Peptide was 0.00 in 2015 -Patient was started on Lantus 7 units sq qHS, Sensitive Novolog SSI AC/HS, and 3 Units TIDwm if patient eats >50%  of meals per Diabetes Coordinator Recc's -CBG's ranging from 141-455; Continue to Monitor Closely  Hypothermia -Secondary to profound hypoglycemia -TSH was 1.362 -Resolved and patient was Slightly febrile at 100.4 likely from Warming Blankets -Temperature was no 98.5  CKD (Chronic Kidney Disease), Stage III -Renal function stable and at baseline -BUN/Cr was 29/1.52; Repeat CMP pending today -Continue to Monitor and repeat CMP in AM  Essential Hypertension -Blood pressure was somewhat suboptimal in setting of acute hypoglycemia and hypothermia but improved -Held Carvedilol 6.25 mg po BID and Amlodipine 10 mg po dail on admission but will restart today  Lactic Acidosis -Likely secondary to hypoperfusion in setting of acute hypoglycemia and associated hypothermia -Improved. LA went from 2.88 -> 1.6 -No reported infectious signs but agree with blood cultures obtained by ER; Blood Cx showed NGTD <24 -Urrinalysis appeared Negative  -Procalcitonin was 0.44  History of CVA in Adulthood from [place] if Right PCA -Chart notes patient has guardian -Patient reports lives alone and uses a rolling walker to mobilize -PT/OT evaluate    DVT prophylaxis: Enoxaparin 40 mg sq q24h Code Status: FULL CODE Family Communication: No family present at bedside Disposition Plan: Pending PT evaluation; Anticipate D/C in 24-48 hours  Consultants:   Diabetes Education Coordinator   Procedures:  None   Antimicrobials:  Anti-infectives    None     Subjective: Seen and examined and was doing better. No nausea or vomiting. States he took insulin twice. No other concerns or complaints.   Objective: Vitals:   06/27/17 1607 06/27/17 1645 06/27/17 2031 06/28/17 0418  BP: 138/80 137/77 (!) 146/64 136/74  Pulse:  70 82 71  Resp:  20 18 18   Temp:  98.8 F (37.1 C) (!) 100.4 F (38 C) 98.5 F (36.9 C)  TempSrc:  Oral Oral Oral  SpO2:  100% 99% 100%  Weight:   60.8 kg (134 lb)   Height:    5' 9.5" (1.765 m)     Intake/Output Summary (Last 24 hours) at 06/28/17 0754 Last data filed at 06/28/17 0724  Gross per 24 hour  Intake          3443.33 ml  Output             3700 ml  Net          -256.67 ml   Filed Weights   06/27/17 1032 06/27/17 2031  Weight: 61.2 kg (135 lb) 60.8 kg (134 lb)   Examination: Physical Exam:  Constitutional: Thin AAM in NAD and appears calm and comfortable Eyes:  Lids and conjunctivae normal, sclerae anicteric  ENMT: External Ears, Nose appear normal. Grossly normal hearing. Mucous membranes are moist. Has poor dentition  Neck: Appears normal, supple, no cervical masses, normal ROM, no appreciable thyromegaly, no JVD Respiratory: Clear to auscultation bilaterally, no wheezing, rales, rhonchi or crackles. Normal respiratory effort and patient is not tachypenic. No accessory muscle use.  Cardiovascular: RRR, no murmurs / rubs / gallops. S1 and S2 auscultated. No extremity edema. Abdomen: Soft, non-tender, non-distended. No masses palpated. No appreciable hepatosplenomegaly. Bowel sounds positive.  GU: Deferred. Musculoskeletal: No clubbing / cyanosis of digits/nails. No joint deformity upper and lower extremities. Good ROM, no contractures.  Skin: No rashes, lesions, ulcers. No induration; Warm and dry.  Neurologic: CN 2-12 grossly intact with no focal deficits. Sensation intact in all 4 Extremities. Romberg sign cerebellar reflexes not assessed.  Psychiatric:  Alert and oriented x 3. Normal mood and appropriate affect.   Data Reviewed: I have personally reviewed following labs and imaging studies  CBC:  Recent Labs Lab 06/27/17 1230  WBC 7.2  NEUTROABS 5.3  HGB 10.6*  HCT 32.8*  MCV 91.4  PLT 242   Basic Metabolic Panel:  Recent Labs Lab 06/27/17 1320  NA 137  K 4.8  CL 106  CO2 21*  GLUCOSE 119*  BUN 29*  CREATININE 1.52*  CALCIUM 9.0   GFR: Estimated Creatinine Clearance: 37.8 mL/min (A) (by C-G formula based on SCr of  1.52 mg/dL (H)). Liver Function Tests:  Recent Labs Lab 06/27/17 1320  AST 45*  ALT 29  ALKPHOS 102  BILITOT 0.3  PROT 7.1  ALBUMIN 3.6   No results for input(s): LIPASE, AMYLASE in the last 168 hours. No results for input(s): AMMONIA in the last 168 hours. Coagulation Profile: No results for input(s): INR, PROTIME in the last 168 hours. Cardiac Enzymes:  Recent Labs Lab 06/27/17 1320  CKTOTAL 100   BNP (last 3 results) No results for input(s): PROBNP in the last 8760 hours. HbA1C:  Recent Labs  06/27/17 1320  HGBA1C 9.9*   CBG:  Recent Labs Lab 06/28/17 0006 06/28/17 0143 06/28/17 0329 06/28/17 0410 06/28/17 0733  GLUCAP >600* >600* 345* 263* 141*   Lipid Profile: No results for input(s): CHOL, HDL, LDLCALC, TRIG, CHOLHDL, LDLDIRECT in the last 72 hours. Thyroid Function Tests:  Recent Labs  06/27/17 1512  TSH 1.362   Anemia Panel: No results for input(s): VITAMINB12, FOLATE, FERRITIN, TIBC, IRON, RETICCTPCT in the last 72 hours. Sepsis Labs:  Recent Labs Lab 06/27/17 1239 06/27/17 1512 06/27/17 1601  PROCALCITON  --  0.44  --   LATICACIDVEN 2.88*  --  1.6  No results found for this or any previous visit (from the past 240 hour(s)).   Radiology Studies: Dg Chest Portable 1 View  Result Date: 06/27/2017 CLINICAL DATA:  71 year old male - evaluate source of infection. EXAM: PORTABLE CHEST 1 VIEW COMPARISON:  04/27/2017 and prior radiographs FINDINGS: The cardiomediastinal silhouette is unremarkable. There is no evidence of focal airspace disease, pulmonary edema, suspicious pulmonary nodule/mass, pleural effusion, or pneumothorax. No acute bony abnormalities are identified. IMPRESSION: No active disease. Electronically Signed   By: Margarette Canada M.D.   On: 06/27/2017 14:17   Scheduled Meds: . aspirin EC  81 mg Oral Daily  . enoxaparin (LOVENOX) injection  40 mg Subcutaneous Q24H  . FLUoxetine  40 mg Oral Daily  . Influenza vac split  quadrivalent PF  0.5 mL Intramuscular Tomorrow-1000  . sodium chloride flush  3 mL Intravenous Q12H   Continuous Infusions: . dextrose 100 mL/hr at 06/28/17 0330    LOS: 0 days   [name], DO [facility] Pager [phone]  If 7PM-7AM, please contact night-coverage [email or web address] Password [facility] 06/28/2017, 7:54 AM

## 2017-06-28 NOTE — Progress Notes (Signed)
   06/28/17 1800  Clinical Encounter Type  Visited With Patient  Visit Type Initial (Advance Directives Material)  Referral From Physician  Recommendations gave materials AD to patient to consider and avail M-F before 3   Spiritual Encounters  Spiritual Needs Prayer;Other (Comment) (AD materials explained )  Stress Factors  Patient Stress Factors None identified  Family Stress Factors None identified  Advance Directives (For Healthcare)  Does Patient Have a Medical Advance Directive? No (considering)  visited with patient and discussed special concerns before prayer. Asked patient about AD and he seemed interested. Explained this is available M-F before 3. Explained do not sign until witnesses and notary are here.

## 2017-06-28 NOTE — Progress Notes (Signed)
Inpatient Diabetes Program Recommendations  AACE/ADA: New Consensus Statement on Inpatient Glycemic Control (2015)  Target Ranges:  Prepandial:   less than 140 mg/dL      Peak postprandial:   less than 180 mg/dL (1-2 hours)      Critically ill patients:  140 - 180 mg/dL   Lab Results  Component Value Date   GLUCAP 141 (H) 06/28/2017   HGBA1C 9.9 (H) 06/27/2017    Review of Glycemic Control Results for [name] [name], [name] [name] (MRN [id number]) as of 06/28/2017 07:35  Ref. Range 06/28/2017 00:06 06/28/2017 01:43 06/28/2017 03:29 06/28/2017 04:10 06/28/2017 07:33  Glucose-Capillary Latest Ref Range: 65 - 99 mg/dL >600 (HH) >600 (HH) 345 (H) 263 (H) 141 (H)   Diabetes history: DM Outpatient Diabetes medications: Lantus 5 units bid per chart Current orders for Inpatient glycemic control: None  Inpatient Diabetes Program Recommendations:  Patient was last seen by Diabetes Coordinator 05/05/17 on 7th admission in 7 months.  - Lantus 7 units QHS -Novolog 0-9 units tidwc and hs 0-5 units correction -3 units tidwc meal coverage if eats 50%  Will follow during hospitalization.  Thank you, [name] [name]. [name] Ethier, RN, MSN, CDE  Diabetes Coordinator Inpatient Glycemic Control Team Team Pager [phone] (8am-5pm) 06/28/2017 7:39 AM

## 2017-06-28 NOTE — Evaluation (Signed)
Occupational Therapy Evaluation Patient Details Name: [name] [name] MRN: [id number] DOB: 1946-08-19 Today's Date: 06/28/2017    History of Present Illness Pt presented to the ED with acute hypoglycemia. Pt reports taking twice the amount of insulin he was supposed to accidentally. PMH significant for hypertension, CVA, brittle insulin dependent diabetes, and stage III chronic kidney disease. Pt recently hospitalized with DKA and UTI.    Clinical Impression   PTA, pt reports independence with ADL and home management tasks. His daughter provides transportation and checks in on him daily. Pt currently requires min guard assist for toilet transfers due to decreased stability during dynamic tasks. He additionally requires supervision for safety with standing grooming tasks. Pt reporting home set-up information inconsistently and demonstrates decreased awareness of safety and deficits. He would benefit from continued OT services while admitted to improve independence with ADL and functional mobility. OT will continue to follow while admitted in preparation for D/C home with home health OT follow-up.     Follow Up Recommendations  Home health OT;Supervision/Assistance - 24 hour    Equipment Recommendations  3 in 1 bedside commode    Recommendations for Other Services       Precautions / Restrictions Precautions Precautions: Fall Precaution Comments: Unstable on feet Restrictions Weight Bearing Restrictions: No      Mobility Bed Mobility Overal bed mobility: Modified Independent                Transfers Overall transfer level: Needs assistance   Transfers: Sit to/from Stand Sit to Stand: Supervision         General transfer comment: Supervision on sit<> stand with min guard assist once participating in functional mobility.     Balance Overall balance assessment: Needs assistance Sitting-balance support: No upper extremity supported;Feet supported Sitting balance-Leahy  Scale: Good     Standing balance support: Bilateral upper extremity supported;No upper extremity supported Standing balance-Leahy Scale: Fair Standing balance comment: Able to statically stand for ADL tasks but requires min guard assist for dynamic standing tasks.                            ADL either performed or assessed with clinical judgement   ADL Overall ADL's : Needs assistance/impaired Eating/Feeding: Sitting;Set up   Grooming: Supervision/safety;Standing   Upper Body Bathing: Supervision/ safety;Sitting   Lower Body Bathing: Min guard;Sit to/from stand   Upper Body Dressing : Supervision/safety;Sitting   Lower Body Dressing: Min guard;Sit to/from stand   Toilet Transfer: Min guard;Ambulation Toilet Transfer Details (indicate cue type and reason): Simulated in room Toileting- Clothing Manipulation and Hygiene: Supervision/safety;Sit to/from stand       Functional mobility during ADLs: Min guard General ADL Comments: Min guard assist for safety this session as pt with shuffling and unstable steps.      Vision Patient Visual Report: No change from baseline Vision Assessment?: No apparent visual deficits     Perception     Praxis      Pertinent Vitals/Pain Pain Assessment: No/denies pain     Hand Dominance Right   Extremity/Trunk Assessment Upper Extremity Assessment Upper Extremity Assessment: Generalized weakness   Lower Extremity Assessment Lower Extremity Assessment: Generalized weakness       Communication     Cognition Arousal/Alertness: Awake/alert Behavior During Therapy: WFL for tasks assessed/performed Overall Cognitive Status: No family/caregiver present to determine baseline cognitive functioning Area of Impairment: Memory;Attention;Awareness;Problem solving;Safety/judgement  Current Attention Level: Selective Memory: Decreased short-term memory   Safety/Judgement: Decreased awareness of  safety;Decreased awareness of deficits Awareness: Emergent Problem Solving: Slow processing General Comments: Pt with inconsistent reports concerning home set-up. Able to complete delayed recall task. Decreased awareness of deficits and their effect on his safety.    General Comments       Exercises     Shoulder Instructions      Home Living Family/patient expects to be discharged to:: Private residence Living Arrangements: Alone Available Help at Discharge: Family;Available PRN/intermittently Type of Home: House (previous admission states apartment) Home Access: Elevator     Home Layout: Multi-level     Bathroom Shower/Tub: Hospital doctor Toilet: Standard     Home Equipment: None   Additional Comments: Pt reports he rented out his home because it was 3 levels. Per chart on previous admission, pt living in apartment. Pt reporting to OT this session that he lives on one level of his home.       Prior Functioning/Environment Level of Independence: Independent        Comments: Daughter checks in on pt daily and provides transportation.         OT Problem List: Impaired balance (sitting and/or standing);Decreased cognition;Decreased safety awareness;Decreased knowledge of use of DME or AE;Decreased strength;Decreased activity tolerance      OT Treatment/Interventions: Self-care/ADL training;Therapeutic exercise;Energy conservation;DME and/or AE instruction;Therapeutic activities;Patient/family education;Balance training    OT Goals(Current goals can be found in the care plan section) Acute Rehab OT Goals Patient Stated Goal: to go home today OT Goal Formulation: With patient Time For Goal Achievement: 07/05/17 Potential to Achieve Goals: Good ADL Goals Pt Will Perform Grooming: with modified independence;standing Pt Will Perform Upper Body Dressing: with modified independence;sitting Pt Will Perform Lower Body Dressing: with modified independence;sit  to/from stand Pt Will Transfer to Toilet: with modified independence;ambulating;regular height toilet Pt Will Perform Toileting - Clothing Manipulation and hygiene: with modified independence;sit to/from stand  OT Frequency: Min 2X/week   Barriers to D/C:            Co-evaluation              AM-PAC PT "6 Clicks" Daily Activity     Outcome Measure Help from another person eating meals?: None Help from another person taking care of personal grooming?: A Little Help from another person toileting, which includes using toliet, bedpan, or urinal?: A Little Help from another person bathing (including washing, rinsing, drying)?: A Little Help from another person to put on and taking off regular upper body clothing?: None Help from another person to put on and taking off regular lower body clothing?: A Little 6 Click Score: 20   End of Session Equipment Utilized During Treatment: Gait belt Nurse Communication: Mobility status  Activity Tolerance: Patient tolerated treatment well Patient left: in bed;with call bell/phone within reach  OT Visit Diagnosis: Muscle weakness (generalized) (M62.81);Other symptoms and signs involving cognitive function                Time: 1330-1345 OT Time Calculation (min): 15 min Charges:  OT General Charges $OT Visit: 1 Visit OT Evaluation $OT Eval Moderate Complexity: 1 Mod G-Codes:     Norman Herrlich, MS OTR/L  Pager: [place] A Staley Budzinski 06/28/2017, 2:42 PM

## 2017-06-29 LAB — COMPREHENSIVE METABOLIC PANEL
ALT: 28 U/L (ref 17–63)
AST: 34 U/L (ref 15–41)
Albumin: 3.1 g/dL — ABNORMAL LOW (ref 3.5–5.0)
Alkaline Phosphatase: 102 U/L (ref 38–126)
Anion gap: 11 (ref 5–15)
BUN: 27 mg/dL — ABNORMAL HIGH (ref 6–20)
CO2: 18 mmol/L — ABNORMAL LOW (ref 22–32)
Calcium: 8.9 mg/dL (ref 8.9–10.3)
Chloride: 102 mmol/L (ref 101–111)
Creatinine, Ser: 1.37 mg/dL — ABNORMAL HIGH (ref 0.61–1.24)
GFR calc Af Amer: 58 mL/min — ABNORMAL LOW (ref >60)
GFR calc non Af Amer: 50 mL/min — ABNORMAL LOW (ref >60)
Glucose, Bld: 385 mg/dL — ABNORMAL HIGH (ref 65–99)
Potassium: 4.4 mmol/L (ref 3.5–5.1)
Sodium: 131 mmol/L — ABNORMAL LOW (ref 135–145)
Total Bilirubin: 0.3 mg/dL (ref 0.3–1.2)
Total Protein: 6.9 g/dL (ref 6.5–8.1)

## 2017-06-29 LAB — CBC WITH DIFFERENTIAL/PLATELET
Basophils Absolute: 0 10*3/uL (ref 0.0–0.1)
Basophils Relative: 0 %
Eosinophils Absolute: 0.3 10*3/uL (ref 0.0–0.7)
Eosinophils Relative: 5 %
HCT: 29.3 % — ABNORMAL LOW (ref 39.0–52.0)
Hemoglobin: 9.9 g/dL — ABNORMAL LOW (ref 13.0–17.0)
Lymphocytes Relative: 26 %
Lymphs Abs: 1.8 10*3/uL (ref 0.7–4.0)
MCH: 30.5 pg (ref 26.0–34.0)
MCHC: 33.8 g/dL (ref 30.0–36.0)
MCV: 90.2 fL (ref 78.0–100.0)
Monocytes Absolute: 0.5 10*3/uL (ref 0.1–1.0)
Monocytes Relative: 7 %
Neutro Abs: 4.3 10*3/uL (ref 1.7–7.7)
Neutrophils Relative %: 62 %
Platelets: 216 10*3/uL (ref 150–400)
RBC: 3.25 MIL/uL — ABNORMAL LOW (ref 4.22–5.81)
RDW: 14.3 % (ref 11.5–15.5)
WBC: 7 10*3/uL (ref 4.0–10.5)

## 2017-06-29 LAB — GLUCOSE, CAPILLARY
Glucose-Capillary: 91 mg/dL (ref 65–99)
Glucose-Capillary: 270 mg/dL — ABNORMAL HIGH (ref 65–99)
Glucose-Capillary: 362 mg/dL — ABNORMAL HIGH (ref 65–99)
Glucose-Capillary: 323 mg/dL — ABNORMAL HIGH (ref 65–99)
Glucose-Capillary: 223 mg/dL — ABNORMAL HIGH (ref 65–99)

## 2017-06-29 LAB — PHOSPHORUS: Phosphorus: 3.2 mg/dL (ref 2.5–4.6)

## 2017-06-29 LAB — MAGNESIUM: Magnesium: 2.2 mg/dL (ref 1.7–2.4)

## 2017-06-29 LAB — C-PEPTIDE: C-Peptide: <0.1 ng/mL — ABNORMAL LOW (ref 1.1–4.4)

## 2017-06-29 LAB — PROCALCITONIN: Procalcitonin: 0.96 ng/mL

## 2017-06-29 MED ORDER — SODIUM CHLORIDE 0.9 % IV SOLN: INTRAVENOUS | Status: DC

## 2017-06-29 MED ORDER — INSULIN ASPART 100 UNIT/ML ~~LOC~~ SOLN
5.0000 [IU] | Freq: Three times a day (TID) | SUBCUTANEOUS | Status: DC
  Administered 2017-06-29: 5 [IU] via SUBCUTANEOUS

## 2017-06-29 MED ORDER — INSULIN GLARGINE 100 UNIT/ML ~~LOC~~ SOLN
20.0000 [IU] | Freq: Every day | SUBCUTANEOUS | Status: DC
  Administered 2017-06-29: 20 [IU] via SUBCUTANEOUS
  Filled 2017-06-29: qty 0.2

## 2017-06-29 MED ORDER — INSULIN ASPART 100 UNIT/ML ~~LOC~~ SOLN
5.0000 [IU] | Freq: Once | SUBCUTANEOUS | Status: AC
  Administered 2017-06-29: 5 [IU] via SUBCUTANEOUS

## 2017-06-29 MED ORDER — INSULIN ASPART 100 UNIT/ML ~~LOC~~ SOLN
3.0000 [IU] | Freq: Three times a day (TID) | SUBCUTANEOUS | Status: DC
  Administered 2017-06-30: 3 [IU] via SUBCUTANEOUS

## 2017-06-29 NOTE — Progress Notes (Addendum)
Inpatient Diabetes Program Recommendations  AACE/ADA: New Consensus Statement on Inpatient Glycemic Control (2015)  Target Ranges:  Prepandial:   less than 140 mg/dL      Peak postprandial:   less than 180 mg/dL (1-2 hours)      Critically ill patients:  140 - 180 mg/dL   Results for [name] [name], [name] [name] (MRN [id number]) as of 06/29/2017 09:48  Ref. Range 06/27/2017 10:33 06/27/2017 12:15 06/27/2017 17:14 06/27/2017 20:28 06/28/2017 00:06 06/28/2017 01:43 06/28/2017 03:29 06/28/2017 04:10 06/28/2017 07:33 06/28/2017 11:37 06/28/2017 17:06 06/28/2017 20:37 06/29/2017 08:13  Glucose-Capillary Latest Ref Range: 65 - 99 mg/dL 94 144 (H) 91 408 (H) >600 (HH) >600 (HH)  12 units Novolog 345 (H) 263 (H) 141 (H)   455 (H)  12 units Novolog 123 (H)  4 units Novolog 270 (H)  3 units Novolog 362 (H)  12 units Novolog    Home DM Meds: Lantus 5 units BID       Novolog (pt not taking)  Current Insulin Orders: Lantus 7 units QHS      Novolog Sensitive Correction Scale/ SSI (0-9 units) TID AC + HS      Novolog 3 units TID with meals        Note patient Hypoglycemic on admission after accidentally taking too much insulin at home.  Lantus restarted last PM.  CBG this AM was elevated to 362 mg/dl.    Per Review of Chart, patient saw his PCP [name], NP) on 06/09/17.  At that visit, patient was instructed to take the following insulin at home: Lantus 20 units BID Novolog 5 units TID with meals     MD- Please consider the following in-hospital insulin adjustments:  1. Increase Lantus to 20 units QHS (50% total home dose)  2. Increase Novolog Meal Coverage to: Novolog 5 units TID with meals (home dose)    Addendum 1:50pm: Spoke with patient at bedside today (daughter not present).  Patient told me that the [facility] has not been coming to his house.  I read the note from the PT team and it looks as if pt refuses HHRN from time to time. Could not recall how much insulin he is supposed to be taking and  could not recall which insulin he accidentally took too much of at home prior to admission.  I am concerned that this patient cannot safely dose his insulin (states he has a paper on his refrigerator with instructions at home).  States he gives himself all injections and that his daughter does not need to help him.  Patient likely would benefit from closer supervision of insulin doses at home by either the [facility] or his family.     --Will follow patient during hospitalization--  Wyn Quaker RN, MSN, CDE Diabetes Coordinator Inpatient Glycemic Control Team Team Pager: [phone] (8a-5p)

## 2017-06-29 NOTE — Progress Notes (Signed)
Report received from dialysis and pt wheeled to room in nad,alert and coherent. Will initiate adm assessment.

## 2017-06-29 NOTE — Evaluation (Signed)
Physical Therapy Evaluation Patient Details Name: [name] [name] MRN: [id number] DOB: 1946-06-06 Today's Date: 06/29/2017   History of Present Illness  71 y.o. male presented to the ED with acute hypoglycemia on 06/27/17. Pt reports taking twice the amount of insulin he was supposed to accidentally. PMH significant for hypertension, CVA, brittle insulin dependent diabetes, and stage III chronic kidney disease. Pt recently hospitalized with DKA and UTI.   Clinical Impression  Pt is mildly unsteady on his feet and reluctant to agree to an assistive device and politely declining offers of home therapy follow up.  He is certainly at risk for falls, and admits to a sedentary lifestyle.  PT will follow acutely.     Follow Up Recommendations Home health PT;Supervision - Intermittent (pt may decline)    Equipment Recommendations  Rolling walker with 5" wheels (pt may decline)    Recommendations for Other Services   NA    Precautions / Restrictions Precautions Precautions: Fall Precaution Comments: generally unsteady on his feet.  Admitts to 2 falls in the past year both in the bathroom over the bathmat.      Mobility  Bed Mobility Overal bed mobility: Independent                Transfers Overall transfer level: Needs assistance   Transfers: Sit to/from Stand Sit to Stand: Supervision         General transfer comment: supervision for safety  Ambulation/Gait Ambulation/Gait assistance: Min guard Ambulation Distance (Feet): 150 Feet Assistive device: None Gait Pattern/deviations: Step-through pattern;Staggering right;Staggering left Gait velocity: decreased   General Gait Details: Pt with moderately staggering gait pattern, walks as if his sensation in his feet is bad (toes up on the back portion of his feet).  Pt reports his sensation is fine.  His gait pattern did improve with increased gait distance, but still unsteady enough to need an assistive device.  He politely declined  an assistive device and reports "I don't need therapy" when I approached him about having someone come to the house.          Balance Overall balance assessment: Needs assistance Sitting-balance support: Feet supported;No upper extremity supported Sitting balance-Leahy Scale: Good     Standing balance support: Single extremity supported Standing balance-Leahy Scale: Fair                               Pertinent Vitals/Pain Pain Assessment: No/denies pain    Home Living Family/patient expects to be discharged to:: Private residence Living Arrangements: Alone Available Help at Discharge: Family;Available PRN/intermittently (daughter comes in AM and PM to check on him) Type of Home: Apartment (previous admission states apartment) Home Access: Elevator     Home Layout: One level Home Equipment: None      Prior Function Level of Independence: Independent         Comments: Daughter checks in on pt daily and provides transportation. Pt admitts to sedentary lifestyle due to falls.  He no longer goes outside because the last time he did he fell.  He does not have or want an assistive device.      Hand Dominance   Dominant Hand: Right    Extremity/Trunk Assessment   Upper Extremity Assessment Upper Extremity Assessment: Defer to OT evaluation    Lower Extremity Assessment Lower Extremity Assessment: Generalized weakness (4/5 grossly, some instability and stiffness with gait)    Cervical / Trunk Assessment Cervical / Trunk Assessment:  Normal  Communication   Communication: No difficulties  Cognition Arousal/Alertness: Awake/alert Behavior During Therapy: WFL for tasks assessed/performed Overall Cognitive Status:  (Not specifically tested, OT noticed deficits on eval)                                               Assessment/Plan    PT Assessment Patient needs continued PT services  PT Problem List Decreased strength;Decreased  activity tolerance;Decreased balance;Decreased mobility;Decreased knowledge of use of DME       PT Treatment Interventions DME instruction;Gait training;Functional mobility training;Therapeutic activities;Balance training;Therapeutic exercise;Stair training;Neuromuscular re-education;Patient/family education    PT Goals (Current goals can be found in the Care Plan section)  Acute Rehab PT Goals Patient Stated Goal: to go home PT Goal Formulation: With patient Time For Goal Achievement: 07/13/17 Potential to Achieve Goals: Good    Frequency Min 3X/week   Barriers to discharge Decreased caregiver support does not have 24/7 supervision at home       AM-PAC PT "6 Clicks" Daily Activity  Outcome Measure Difficulty turning over in bed (including adjusting bedclothes, sheets and blankets)?: None Difficulty moving from lying on back to sitting on the side of the bed? : None Difficulty sitting down on and standing up from a chair with arms (e.g., wheelchair, bedside commode, etc,.)?: None Help needed moving to and from a bed to chair (including a wheelchair)?: A Little Help needed walking in hospital room?: A Little Help needed climbing 3-5 steps with a railing? : A Little 6 Click Score: 21    End of Session Equipment Utilized During Treatment: Gait belt Activity Tolerance: Patient limited by fatigue Patient left: in chair;with call bell/phone within reach;with chair alarm set   PT Visit Diagnosis: Unsteadiness on feet (R26.81);History of falling (Z91.81);Difficulty in walking, not elsewhere classified (R26.2)    Time: 2992-4268 PT Time Calculation (min) (ACUTE ONLY): 15 min   Charges:       Wells Guiles B. Khalessi Blough, PT, DPT [phone]   PT Evaluation $PT Eval Moderate Complexity: 1 Mod     06/29/2017, 11:11 AM

## 2017-06-29 NOTE — Progress Notes (Signed)
PROGRESS NOTE    [name] [name]  YBO:[id number] DOB: 06-May-1946 DOA: 06/27/2017 PCP: [name], [name]   Brief Narrative:  [name] [name] is a 71 y.o. male with medical history significant for hypertension, prior CVA, brittle insulin dependent diabetes and stage III chronic kidney disease. Patient was recently hospitalized in July with DKA and UTI. Patient has a history of noncompliance with diabetes medications and poorly controlled diabetes in the past. Patient presented to the ER today with acute hypoglycemia and hypothermia after taking his insulin (likely double dose) but forgetting to eat. His initial CBG was 15. He was hypothermic with an oral temperature and admitted for further evaluation and workup. Patient became hypoglycemic again this Afternoon but was asymptomatic so insulin regimen was adjusted. PT/OT Evaluated and recommending Home Health.   Assessment & Plan:   Principal Problem:   Hypoglycemia due to insulin Active Problems:   Uncontrolled type 2 diabetes mellitus with diabetic nephropathy, with long-term current use of insulin ([name])   Hypoglycemia   CKD (chronic kidney disease), stage III   Diabetes mellitus, insulin dependent (IDDM), controlled ([place])   History of CVA in adulthood   Hypertension   Hypothermia   Lactic acid increased   Brittle diabetes mellitus ([place])   Hypoglycemia due to Insulin/ Diabetes Mellitus, Insulin dependent (IDDM), Brittle -Patient presentedwith symptomatic hypoglycemia with initial CBG 15 after taking insulin (double dose of Novolog) but forgetting to eat -History of poorly controlled/Brittle diabetes secondary to noncompliance in the past with hemoglobin A1c 11.6 in July; -Diabetes Education Coordinator consulted -Patient reports has been taking insulin regularly since last discharge and states CBGs typically run between 240 and 280 -Follow CBGs every 2 hours -D5W infusion at 100/hr now D/C'd -C/w Carb Modified Diet -Repeat Hemoglobin A1c  was 9.9 and C-peptide Level <0.1; Last C-Peptide was 0.00 in 2015 -Patient was started on Lantus 7 units sq qHS but increased to 20 units per Diabetes Coordinator Reccs, C/w Sensitive Novolog SSI AC and will D/C HS coverage, and 3 Units TIDwm if patient eats >50% of meals per Diabetes Coordinator Recc's increased to 5 units but cut back to 3 after patient became hypoglycemic at 34; -CBG's ranging from 34-323; Continue to Monitor Closely  Hypothermia, improved -Secondary to profound hypoglycemia -TSH was 1.362 -Resolved and patient was Slightly febrile at 100.4 likely from Warming Blankets -Temperature was no 98.5  CKD (Chronic Kidney Disease), Stage III -Renal function stable and at baseline -BUN/Cr was 29/1.52; Repeat CMP this AM showed BUN/Cr of 27/1.37 -Continue to Monitor and repeat CMP in AM  Essential Hypertension -Blood pressure was somewhat suboptimal in setting of acute hypoglycemia and hypothermia but improved -Held Carvedilol 6.25 mg po BID and Amlodipine 10 mg po dail on admission but will restart today  Lactic Acidosis -Likely secondary to hypoperfusion in setting of acute hypoglycemia and associated hypothermia -Improved. LA went from 2.88 -> 1.6 -No reported infectious signs but agree with blood cultures obtained by ER; Blood Cx showed NGTD <24 -Urrinalysis appeared Negative  -Procalcitonin was 0.44  History of CVA in Adulthood from [place] if Right PCA -Chart notes patient has guardian -Patient reports lives alone and uses a rolling walker to mobilize -PT/OT evaluate and recommend Home Health PT  Hyponatremia -Mild. Na+ was 131 -Started back IVF with NS at 75 mL/hr -Continue to Monitor and replete as Necessary  Anemia of Chronic Disease -Likely from CKD -Hb/Hct went from 9.6/28.9 -> 9.9/29.3 -Continue to Monitor for S/Sx of Bleeding -Repeat CBC in AM  DVT prophylaxis: Enoxaparin 40 mg sq q24h Code Status: FULL CODE Family Communication: No family  present at bedside Disposition Plan: Anticipate D/C Home with Home Health PT in 24-48 hours  Consultants:   Diabetes Education Coordinator   Procedures:  None   Antimicrobials:  Anti-infectives    None     Subjective: Seen and examined and was doing fine this AM but then in the afternoon BS dropped dramatically and he had a low of 34. Was asymptomatic though. He denied any CP or SOB. No other concerns or complaints. Patient was quite forgetful and stated he was taking 70/30 at home but [facility] indicated Lantus.    Objective: Vitals:   06/28/17 1644 06/28/17 1747 06/28/17 2038 06/29/17 0416  BP: (!) 147/77 (!) 142/83 134/61 139/82  Pulse:  72 70 65  Resp:  18 18 18   Temp:  98.5 F (36.9 C) 98.8 F (37.1 C) 98.6 F (37 C)  TempSrc:  Oral Oral Oral  SpO2:  100% 100% 97%  Weight:   61 kg (134 lb 8 oz)   Height:        Intake/Output Summary (Last 24 hours) at 06/29/17 1946 Last data filed at 06/29/17 1800  Gross per 24 hour  Intake           679.17 ml  Output             3475 ml  Net         -2795.83 ml   Filed Weights   06/27/17 1032 06/27/17 2031 06/28/17 2038  Weight: 61.2 kg (135 lb) 60.8 kg (134 lb) 61 kg (134 lb 8 oz)   Examination: Physical Exam:  Constitutional: Thin AAM in NAD appears calm and comfortable laying in bed Eyes: Lids normal. Sclerae anicteric.  ENMT: Poor dentition. MMM. Grossly normal hearing Neck: Supple with no JVD Respiratory: CTAB with no appreciable wheezing/rales/rhonchi Cardiovascular: RRR; No extremity edema noted Abdomen: Soft, NT, ND. Bowel sounds present GU: Deferred Musculoskeletal: No contractures; No cyanosis Skin: Warm and dry. No rashes or lesions on a limited skin eval.  Neurologic: CN 2-12 grossly intact. No appreciable focal deficits Psychiatric:  Impaired Judgement and insight. Alert and Awake. Pleasant mood and affect.  Data Reviewed: I have personally reviewed following labs and imaging studies  CBC:  Recent  Labs Lab 06/27/17 1230 06/28/17 0910 06/29/17 1011  WBC 7.2 10.3 7.0  NEUTROABS 5.3 6.1 4.3  HGB 10.6* 9.6* 9.9*  HCT 32.8* 28.9* 29.3*  MCV 91.4 89.5 90.2  PLT 279 213 017   Basic Metabolic Panel:  Recent Labs Lab 06/27/17 1320 06/28/17 1439 06/29/17 1011  NA 137 135 131*  K 4.8 4.1 4.4  CL 106 106 102  CO2 21* 24 18*  GLUCOSE 119* 225* 385*  BUN 29* 24* 27*  CREATININE 1.52* 1.27* 1.37*  CALCIUM 9.0 8.5* 8.9  MG  --  2.2 2.2  PHOS  --  3.0 3.2   GFR: Estimated Creatinine Clearance: 42.1 mL/min (A) (by C-G formula based on SCr of 1.37 mg/dL (H)). Liver Function Tests:  Recent Labs Lab 06/27/17 1320 06/28/17 1439 06/29/17 1011  AST 45* 37 34  ALT 29 29 28   ALKPHOS 102 102 102  BILITOT 0.3 0.4 0.3  PROT 7.1 6.3* 6.9  ALBUMIN 3.6 3.1* 3.1*   No results for input(s): LIPASE, AMYLASE in the last 168 hours. No results for input(s): AMMONIA in the last 168 hours. Coagulation Profile: No results for input(s): INR, PROTIME in the last  168 hours. Cardiac Enzymes:  Recent Labs Lab 06/27/17 1320  CKTOTAL 100   BNP (last 3 results) No results for input(s): PROBNP in the last 8760 hours. HbA1C:  Recent Labs  06/27/17 1320  HGBA1C 9.9*   CBG:  Recent Labs Lab 06/28/17 1706 06/28/17 2037 06/29/17 0813 06/29/17 1231 06/29/17 1810  GLUCAP 123* 270* 362* 323* 223*   Lipid Profile: No results for input(s): CHOL, HDL, LDLCALC, TRIG, CHOLHDL, LDLDIRECT in the last 72 hours. Thyroid Function Tests:  Recent Labs  06/27/17 1512  TSH 1.362   Anemia Panel: No results for input(s): VITAMINB12, FOLATE, FERRITIN, TIBC, IRON, RETICCTPCT in the last 72 hours. Sepsis Labs:  Recent Labs Lab 06/27/17 1239 06/27/17 1512 06/27/17 1601 06/29/17 0500  PROCALCITON  --  0.44  --  0.96  LATICACIDVEN 2.88*  --  1.6  --     Recent Results (from the past 240 hour(s))  Culture, blood (routine x 2)     Status: None (Preliminary result)   Collection Time:  06/27/17  1:20 PM  Result Value Ref Range Status   Specimen Description BLOOD RIGHT FOREARM  Final   Special Requests   Final    BOTTLES DRAWN AEROBIC AND ANAEROBIC Blood Culture adequate volume   Culture NO GROWTH 2 DAYS  Final   Report Status PENDING  Incomplete  Culture, blood (routine x 2)     Status: None (Preliminary result)   Collection Time: 06/27/17  2:06 PM  Result Value Ref Range Status   Specimen Description BLOOD LEFT FOREARM  Final   Special Requests IN PEDIATRIC BOTTLE Blood Culture adequate volume  Final   Culture NO GROWTH 2 DAYS  Final   Report Status PENDING  Incomplete    Radiology Studies: No results found. Scheduled Meds: . amLODipine  10 mg Oral Daily  . aspirin EC  81 mg Oral Daily  . carvedilol  6.25 mg Oral BID WC  . enoxaparin (LOVENOX) injection  40 mg Subcutaneous Q24H  . FLUoxetine  40 mg Oral Daily  . insulin aspart  0-9 Units Subcutaneous TID WC  . [START ON 06/30/2017] insulin aspart  3 Units Subcutaneous TID WC  . insulin glargine  20 Units Subcutaneous QHS  . sodium chloride flush  3 mL Intravenous Q12H   Continuous Infusions: . sodium chloride 75 mL/hr at 06/29/17 1230    LOS: 1 day   [name], DO [facility] Pager [phone]  If 7PM-7AM, please contact night-coverage [email or web address] Password [facility] 06/29/2017, 7:46 PM

## 2017-06-29 NOTE — Progress Notes (Signed)
Occupational Therapy Treatment Patient Details Name: [name] [name] MRN: [id number] DOB: 1946-07-30 Today's Date: 06/29/2017    History of present illness 71 y.o. male presented to the ED with acute hypoglycemia on 06/27/17. Pt reports taking twice the amount of insulin he was supposed to accidentally. PMH significant for hypertension, CVA, brittle insulin dependent diabetes, and stage III chronic kidney disease. Pt recently hospitalized with DKA and UTI.    OT comments  This 71 yo male seen to address safety/balance with basic ADLs. He did have a LOB x2 that he self corrected by stabilizing against surface with legs and grabbing on to grab bar with arm--he feels he is weak and off balance because of not being up and about more and is not open to even using and AD for the short term. HHOT with 24 hour  S/prn A anytime he is up on his feet is recommendation.  Follow Up Recommendations  Home health OT;Supervision/Assistance - 24 hour    Equipment Recommendations  3 in 1 bedside commode       Precautions / Restrictions Precautions Precautions: Fall Precaution Comments: generally unsteady on his feet.  Admitts to 2 falls in the past year both in the bathroom over the bathmat; however unwilling to use an AD Restrictions Weight Bearing Restrictions: No       Mobility Bed Mobility Overal bed mobility: Independent                Transfers Overall transfer level: Needs assistance Equipment used: None Transfers: Sit to/from Stand Sit to Stand: Supervision         General transfer comment: supervision for safety    Balance Overall balance assessment: Needs assistance Sitting-balance support: Feet supported;No upper extremity supported Sitting balance-Leahy Scale: Good     Standing balance support: Single extremity supported Standing balance-Leahy Scale: Fair Standing balance comment: Able to statically stand with S; but requires min guard assist for dynamic standing tasks.                             ADL either performed or assessed with clinical judgement   ADL Overall ADL's : Needs assistance/impaired     Grooming: Supervision/safety;Standing Grooming Details (indicate cue type and reason): min guard A from bed to sink with one LOB that he self corrected by using the bed against the back of his calves                  Toilet Transfer: Min guard;Ambulation Toilet Transfer Details (indicate cue type and reason): No AD, pt does not feel he needs one that he just needs to be up and about more due to being in bed. He had one LOB that he self corrected by grabbing onto rail in bathroom                 Vision Patient Visual Report: No change from baseline            Cognition Arousal/Alertness: Awake/alert Behavior During Therapy: WFL for tasks assessed/performed Overall Cognitive Status: No family/caregiver present to determine baseline cognitive functioning Area of Impairment: Safety/judgement                         Safety/Judgement: Decreased awareness of deficits;Decreased awareness of safety                         Pertinent Vitals/ Pain  Pain Assessment: No/denies pain  Home Living Family/patient expects to be discharged to:: Private residence Living Arrangements: Alone Available Help at Discharge: Family;Available PRN/intermittently (daughter comes in AM and PM to check on him) Type of Home: Apartment (previous admission states apartment) Home Access: Elevator     Home Layout: One level     Bathroom Shower/Tub: Occupational psychologist: Standard     Home Equipment: None          Prior Functioning/Environment Level of Independence: Independent        Comments: Daughter checks in on pt daily and provides transportation. Pt admitts to sedentary lifestyle due to falls.  He no longer goes outside because the last time he did he fell.  He does not have or want an assistive  device.    Frequency  Min 2X/week        Progress Toward Goals  OT Goals(current goals can now be found in the care plan section)  Progress towards OT goals: Not progressing toward goals - comment (still unsteady on his feet and not agreeable to use AD despite his balance improved with use of IV pole.)  Acute Rehab OT Goals Patient Stated Goal: to go home  Plan Discharge plan remains appropriate       AM-PAC PT "6 Clicks" Daily Activity     Outcome Measure   Help from another person eating meals?: None Help from another person taking care of personal grooming?: A Little Help from another person toileting, which includes using toliet, bedpan, or urinal?: A Little Help from another person bathing (including washing, rinsing, drying)?: A Little Help from another person to put on and taking off regular upper body clothing?: A Little Help from another person to put on and taking off regular lower body clothing?: A Little 6 Click Score: 19    End of Session    OT Visit Diagnosis: Muscle weakness (generalized) (M62.81);Other symptoms and signs involving cognitive function   Activity Tolerance Patient tolerated treatment well   Patient Left in bed;with call bell/phone within reach;Other (comment) (bed alarm not set due to would not set;)   Nurse Communication  (RN, NT, and unit secretary aware bed alarm not set and secretary called for someone to come look at it)        Time: 7209-4709 OT Time Calculation (min): 13 min  Charges: OT General Charges $OT Visit: 1 Visit OT Treatments $Self Care/Home Management : 8-22 mins     Almon Register 06/29/2017, 1:39 PM

## 2017-06-29 NOTE — Progress Notes (Signed)
Hypoglycemic Event  CBG: 34  Treatment: 15 GM carbohydrate snack  Symptoms: None  Follow-up CBG: Time:1810 CBG Result:223  Possible Reasons for Event: Unknown  Comments/MD notified Dr Alfredia Ferguson notified of cbg 34, pt given carb snack. MD readjusting meal coverage.     [name] [name], [place]

## 2017-06-29 NOTE — Progress Notes (Signed)
[facility]  Patient Status: Active (receiving services up to time of hospitalization)  [facility] is providing the following services: RN and MSW  If patient discharges after hours, please call [phone].   [name] [name] 06/29/2017, 12:37 PM

## 2017-06-30 LAB — CBC WITH DIFFERENTIAL/PLATELET
Basophils Absolute: 0 10*3/uL (ref 0.0–0.1)
Basophils Relative: 0 %
Eosinophils Absolute: 0.4 10*3/uL (ref 0.0–0.7)
Eosinophils Relative: 6 %
HCT: 28.7 % — ABNORMAL LOW (ref 39.0–52.0)
Hemoglobin: 9.3 g/dL — ABNORMAL LOW (ref 13.0–17.0)
Lymphocytes Relative: 32 %
Lymphs Abs: 2.3 10*3/uL (ref 0.7–4.0)
MCH: 29.1 pg (ref 26.0–34.0)
MCHC: 32.4 g/dL (ref 30.0–36.0)
MCV: 89.7 fL (ref 78.0–100.0)
Monocytes Absolute: 0.6 10*3/uL (ref 0.1–1.0)
Monocytes Relative: 9 %
Neutro Abs: 3.8 10*3/uL (ref 1.7–7.7)
Neutrophils Relative %: 53 %
Platelets: 233 10*3/uL (ref 150–400)
RBC: 3.2 MIL/uL — ABNORMAL LOW (ref 4.22–5.81)
RDW: 14 % (ref 11.5–15.5)
WBC: 7.1 10*3/uL (ref 4.0–10.5)

## 2017-06-30 LAB — COMPREHENSIVE METABOLIC PANEL
ALT: 24 U/L (ref 17–63)
AST: 26 U/L (ref 15–41)
Albumin: 2.9 g/dL — ABNORMAL LOW (ref 3.5–5.0)
Alkaline Phosphatase: 88 U/L (ref 38–126)
Anion gap: 9 (ref 5–15)
BUN: 31 mg/dL — ABNORMAL HIGH (ref 6–20)
CO2: 22 mmol/L (ref 22–32)
Calcium: 8.6 mg/dL — ABNORMAL LOW (ref 8.9–10.3)
Chloride: 106 mmol/L (ref 101–111)
Creatinine, Ser: 1.25 mg/dL — ABNORMAL HIGH (ref 0.61–1.24)
GFR calc Af Amer: >60 mL/min (ref >60)
GFR calc non Af Amer: 56 mL/min — ABNORMAL LOW (ref >60)
Glucose, Bld: 137 mg/dL — ABNORMAL HIGH (ref 65–99)
Potassium: 4.2 mmol/L (ref 3.5–5.1)
Sodium: 137 mmol/L (ref 135–145)
Total Bilirubin: 0.5 mg/dL (ref 0.3–1.2)
Total Protein: 6.2 g/dL — ABNORMAL LOW (ref 6.5–8.1)

## 2017-06-30 LAB — MAGNESIUM: Magnesium: 2.2 mg/dL (ref 1.7–2.4)

## 2017-06-30 LAB — GLUCOSE, CAPILLARY
Glucose-Capillary: 38 mg/dL — CL (ref 65–99)
Glucose-Capillary: 418 mg/dL — ABNORMAL HIGH (ref 65–99)
Glucose-Capillary: 118 mg/dL — ABNORMAL HIGH (ref 65–99)
Glucose-Capillary: 151 mg/dL — ABNORMAL HIGH (ref 65–99)
Glucose-Capillary: 75 mg/dL (ref 65–99)
Glucose-Capillary: 322 mg/dL — ABNORMAL HIGH (ref 65–99)

## 2017-06-30 LAB — PHOSPHORUS: Phosphorus: 4.4 mg/dL (ref 2.5–4.6)

## 2017-06-30 MED ORDER — INSULIN ASPART 100 UNIT/ML FLEXPEN: 3.0000 [IU] | PEN_INJECTOR | Freq: Three times a day (TID) | SUBCUTANEOUS | 1 refills | Status: DC

## 2017-06-30 MED ORDER — INSULIN PEN NEEDLE 29G X 10MM MISC: 2 refills | Status: DC

## 2017-06-30 MED ORDER — BLOOD GLUCOSE METER KIT: PACK | 0 refills | Status: DC

## 2017-06-30 MED ORDER — VITAMIN B-12 1000 MCG PO TABS: 1000.0000 ug | ORAL_TABLET | Freq: Every day | ORAL | 1 refills | Status: DC

## 2017-06-30 MED ORDER — GLUCAGON (RDNA) 1 MG IJ KIT: 1.0000 mg | PACK | Freq: Once | INTRAMUSCULAR | 12 refills | Status: DC | PRN

## 2017-06-30 MED ORDER — "INSULIN SYRINGE 31G X 5/16"" 0.3 ML MISC": 5.0000 [IU] | Freq: Two times a day (BID) | 0 refills | Status: DC

## 2017-06-30 MED ORDER — GLUCERNA SHAKE PO LIQD: 237.0000 mL | Freq: Two times a day (BID) | ORAL | 0 refills | Status: DC

## 2017-06-30 MED ORDER — INSULIN GLARGINE 100 UNIT/ML ~~LOC~~ SOLN
10.0000 [IU] | Freq: Every day | SUBCUTANEOUS | Status: DC
  Filled 2017-06-30: qty 0.1

## 2017-06-30 MED ORDER — INSULIN GLARGINE 100 UNIT/ML SOLOSTAR PEN: 10.0000 [IU] | PEN_INJECTOR | Freq: Every day | SUBCUTANEOUS | 0 refills | Status: DC

## 2017-06-30 NOTE — Plan of Care (Signed)
Problem: Education: Goal: Knowledge of [place] [facility] Education information/materials will improve Outcome: Progressing Discuss and review plan of care with the pt  Problem: Health Behavior/Discharge Planning: Goal: Ability to manage health-related needs will improve Outcome: Progressing Encourage with compliance with the tx  Problem: Pain Managment: Goal: General experience of comfort will improve Outcome: Adequate for Discharge Assess and or monitor presence of pain  Problem: Physical Regulation: Goal: Ability to maintain clinical measurements within normal limits will improve Outcome: Progressing Assess and monitor response  To treatment Goal: Will remain free from infection Outcome: Progressing Provide infection prevention measures  Problem: Skin Integrity: Goal: Risk for impaired skin integrity will decrease Outcome: Progressing Provide skin care

## 2017-06-30 NOTE — Progress Notes (Signed)
Physical Therapy Treatment Patient Details Name: [name] [name] MRN: [id number] DOB: 01/25/1946 Today's Date: 06/30/2017    History of Present Illness 71 y.o. male presented to the ED with acute hypoglycemia on 06/27/17. Pt reports taking twice the amount of insulin he was supposed to accidentally. PMH significant for hypertension, CVA, brittle insulin dependent diabetes, and stage III chronic kidney disease. Pt recently hospitalized with DKA and UTI.     PT Comments    Continuing work on functional mobility and activity tolerance;  Noting overall improvements in gait and balance, still noting that pt tends to reach out for UE support/furniture walks initially; upgraded equipment recommendations from RW to cane -- he remains resistant to using any assistive device, or HHPT or Outpt PT   Follow Up Recommendations  Home health PT;Supervision - Intermittent (pt may decline)     Equipment Recommendations  Cane (pt may decline)    Recommendations for Other Services       Precautions / Restrictions Precautions Precautions: Fall Precaution Comments: generally unsteady on his feet.  Admitts to 2 falls in the past year both in the bathroom over the bathmat; however unwilling to use an AD    Mobility  Bed Mobility Overal bed mobility: Independent                Transfers Overall transfer level: Needs assistance Equipment used: None Transfers: Sit to/from Stand Sit to Stand: Supervision         General transfer comment: supervision for safety  Ambulation/Gait Ambulation/Gait assistance: Min guard;Supervision Ambulation Distance (Feet): 150 Feet (greater than) Assistive device: None (and pushing IV pole) Gait Pattern/deviations: Step-through pattern Gait velocity: decreased   General Gait Details: Noting less staggering this session; wider step width; stiff trunk with decr trunk rotation, and no UE swing or opposition; Able to loosen up and incr arm swing with  cues   Stairs Stairs:  (Declined stair practice)          Wheelchair Mobility    Modified Rankin (Stroke Patients Only)       Balance     Sitting balance-Leahy Scale: Good       Standing balance-Leahy Scale: Fair (approaching Good)                              Cognition Arousal/Alertness: Awake/alert Behavior During Therapy: WFL for tasks assessed/performed Overall Cognitive Status: No family/caregiver present to determine baseline cognitive functioning                                        Exercises      General Comments        Pertinent Vitals/Pain Pain Assessment: No/denies pain    Home Living                      Prior Function            PT Goals (current goals can now be found in the care plan section) Acute Rehab PT Goals Patient Stated Goal: to go home PT Goal Formulation: With patient Time For Goal Achievement: 07/13/17 Potential to Achieve Goals: Good Progress towards PT goals: Progressing toward goals    Frequency    Min 3X/week      PT Plan Current plan remains appropriate    Co-evaluation  AM-PAC PT "6 Clicks" Daily Activity  Outcome Measure  Difficulty turning over in bed (including adjusting bedclothes, sheets and blankets)?: None Difficulty moving from lying on back to sitting on the side of the bed? : None Difficulty sitting down on and standing up from a chair with arms (e.g., wheelchair, bedside commode, etc,.)?: None Help needed moving to and from a bed to chair (including a wheelchair)?: None Help needed walking in hospital room?: A Little Help needed climbing 3-5 steps with a railing? : A Little 6 Click Score: 22    End of Session Equipment Utilized During Treatment: Gait belt Activity Tolerance: Patient tolerated treatment well Patient left: in bed;with call bell/phone within reach;with bed alarm set Nurse Communication: Mobility status PT Visit  Diagnosis: Unsteadiness on feet (R26.81);History of falling (Z91.81);Difficulty in walking, not elsewhere classified (R26.2)     Time: 4462-8638 PT Time Calculation (min) (ACUTE ONLY): 17 min  Charges:  $Gait Training: 8-22 mins                    G Codes:       Roney Marion, PT  Acute Rehabilitation Services Pager [phone] Office [place] 06/30/2017, 12:32 PM

## 2017-06-30 NOTE — Progress Notes (Signed)
[name] [name] to be D/C'd Home per MD order.  Discussed prescriptions and follow up appointments with the patient. Prescriptions given to patient, medication list explained in detail. Pt verbalized understanding.  Allergies as of 06/30/2017   No Known Allergies     Medication List    STOP taking these medications   HYDROcodone-acetaminophen 5-325 MG tablet Commonly known as:  NORCO/VICODIN   traMADol-acetaminophen 37.5-325 MG tablet Commonly known as:  ULTRACET     TAKE these medications   acetaminophen 325 MG tablet Commonly known as:  TYLENOL Take 2 tablets (650 mg total) by mouth every 6 (six) hours as needed for mild pain (or Fever >/= 101).   amLODipine 10 MG tablet Commonly known as:  NORVASC Take 1 tablet (10 mg total) by mouth daily.   aspirin 81 MG EC tablet Take 1 tablet (81 mg total) by mouth daily.   blood glucose meter kit and supplies Dispense based on patient and insurance preference. Use up to four times daily as directed. (FOR ICD-9 250.00, 250.01).   blood glucose meter kit and supplies Dispense based on patient and insurance preference. Use up to four times daily as directed. (FOR ICD-9 250.00, 250.01).   carvedilol 6.25 MG tablet Commonly known as:  COREG Take 1 tablet (6.25 mg total) by mouth 2 (two) times daily with a meal. What changed:  when to take this   feeding supplement (GLUCERNA SHAKE) Liqd Take 237 mLs by mouth 2 (two) times daily between meals.   FLUoxetine 40 MG capsule Commonly known as:  PROZAC Take 1 capsule (40 mg total) by mouth daily.   glucagon 1 MG injection Commonly known as:  GLUCAGON EMERGENCY Inject 1 mg into the vein once as needed.   insulin aspart 100 UNIT/ML FlexPen Commonly known as:  NOVOLOG Inject 3 Units into the skin 3 (three) times daily with meals. For blood sugars greater than 150 only What changed:  how much to take   Insulin Glargine 100 UNIT/ML Solostar Pen Commonly known as:  LANTUS SOLOSTAR Inject 10  Units into the skin daily at 10 pm. What changed:  how much to take  when to take this   Insulin Pen Needle 29G X 10MM Misc To attached to insulin pen and administer medication as prescribed.   INSULIN SYRINGE .3CC/31GX5/16" 31G X 5/16" 0.3 ML Misc 5 Units by Does not apply route 2 (two) times daily.   vitamin B-12 1000 MCG tablet Commonly known as:  CYANOCOBALAMIN Take 1 tablet (1,000 mcg total) by mouth daily.            Durable Medical Equipment        Start     Ordered   06/30/17 1405  For home use only DME Walker rolling  [facility])  Once    Question:  Patient needs a walker to treat with the following condition  Answer:  Generalized weakness   06/30/17 1406       Discharge Care Instructions        Start     Ordered   06/30/17 0000  blood glucose meter kit and supplies    Question Answer Comment  Number of strips 300   Number of lancets 300      06/30/17 1406   06/30/17 0000  blood glucose meter kit and supplies    Question Answer Comment  Number of strips 100   Number of lancets 100      06/30/17 1406   06/30/17 0000  feeding supplement,  GLUCERNA SHAKE, (GLUCERNA SHAKE) LIQD  2 times daily between meals     06/30/17 1406   06/30/17 0000  insulin aspart (NOVOLOG) 100 UNIT/ML FlexPen  3 times daily with meals     06/30/17 1406   06/30/17 0000  Insulin Glargine (LANTUS SOLOSTAR) 100 UNIT/ML Solostar Pen  Daily at 10 pm     06/30/17 1406   06/30/17 0000  Insulin Pen Needle 29G X 10MM MISC     06/30/17 1406   06/30/17 0000  Insulin Syringe-Needle U-100 (INSULIN SYRINGE .3CC/31GX5/16") 31G X 5/16" 0.3 ML MISC  2 times daily     06/30/17 1406   06/30/17 0000  vitamin B-12 (CYANOCOBALAMIN) 1000 MCG tablet  Daily     06/30/17 1406   06/30/17 0000  Ambulatory referral to Nutrition and Diabetic Education     06/30/17 1406   06/30/17 0000  Increase activity slowly     06/30/17 1406   06/30/17 0000  Diet - low sodium heart healthy     06/30/17 1406    06/30/17 0000  Discharge instructions    Comments:  Follow up PCP, Endocrinology, and Ambulatory Referral to OP Diabetic Coordinator. Take all medications as prescribed. If   06/30/17 1406   06/30/17 0000  Call MD for:  temperature >100.4     06/30/17 1406   06/30/17 0000  Call MD for:  persistant nausea and vomiting     06/30/17 1406   06/30/17 0000  Call MD for:  severe uncontrolled pain     06/30/17 1406   06/30/17 0000  Call MD for:  redness, tenderness, or signs of infection (pain, swelling, redness, odor or green/yellow discharge around incision site)     06/30/17 1406   06/30/17 0000  Call MD for:  difficulty breathing, headache or visual disturbances     06/30/17 1406   06/30/17 0000  Call MD for:  hives     06/30/17 1406   06/30/17 0000  Call MD for:  persistant dizziness or light-headedness     06/30/17 1406   06/30/17 0000  Call MD for:  extreme fatigue     06/30/17 1406   06/30/17 0000  glucagon (GLUCAGON EMERGENCY) 1 MG injection  Once PRN     06/30/17 1406      Vitals:   06/30/17 0515 06/30/17 1000  BP: (!) 145/74 132/72  Pulse: 69 72  Resp: 18 18  Temp: 98.3 F (36.8 C) 98 F (36.7 C)  SpO2: 100% 99%    Skin clean, dry and intact without evidence of skin break down, no evidence of skin tears noted. IV catheter discontinued intact. Site without signs and symptoms of complications. Dressing and pressure applied. Pt denies pain at this time. No complaints noted.  An After Visit Summary was printed and given to the patient. Patient escorted via [place], and D/C home via private auto.  Dixie Dials RN, BSN

## 2017-06-30 NOTE — Care Management Note (Signed)
Case Management Note  Patient Details  Name: [name] [name] MRN: [id number] Date of Birth: 06-06-1946  Subjective/Objective:         CM following for progression and d/c planning.            Action/Plan: 06/30/2017 Met with pt who gave CM permission to speak to his daughter re [facility] needs . This CM spoke with pt daugher who states that the pt has been active with [facility] for [facility] services and she wishes to continue these services. This CM notified [place] and learned that this pt has been receiving [place] services. This level of [place] service will be continued per [facility].Pt daughter will provide transportation home.  No DME needs.   Expected Discharge Date:  06/30/17               Expected Discharge Plan:  [place]  In-House Referral:  NA  Discharge planning Services  CM Consult  Post Acute Care Choice:  Home Health Choice offered to:  Adult Children  DME Arranged:  N/A DME Agency:  [place] Arranged:  RN, OT, PT, Nurse's Aide [place] Agency:  [place]  Status of Service:  Completed, signed off  If discussed at [place] of Stay Meetings, dates discussed:    Additional Comments:  Adron Bene, RN 06/30/2017, 4:01 PM

## 2017-06-30 NOTE — Discharge Summary (Signed)
Physician Discharge Summary  [name] [name] WNI:[id number] DOB: 1945-12-13 DOA: 06/27/2017  PCP: [name], [name]  Admit date: 06/27/2017 Discharge date: 06/30/2017  Admitted From: Home Disposition: Home with Home Health PT/OT/RN/Aide  Recommendations for Outpatient Follow-up:  1. Follow up with PCP in 1-2 weeks 2. Follow up with Endocrinology as an outpatient. Referral was made for you by PCP last visit 3. Follow up with Outpatient Diabetic Education Coordinatior 4. Please obtain CMP/CBC, Mag, Phos in one week 5. Patient will need close supervision with Insulin injections and monitoring 6. Follow up on Blood Cx's   Home Health: YES Equipment/Devices: RW with 5" wheels    Discharge Condition: Stable CODE STATUS: FULL CODE Diet recommendation: Heart Healthy Carb Modified Diet  Brief/Interim Summary: [name] [name] a 71 y.o.malewith medical history significant for hypertension, prior CVA, brittle insulin dependent diabetes and stage III chronic kidney disease. Patient was recently hospitalized in July with DKA and UTI. Patient has a history of noncompliance with diabetes medications and poorly controlled diabetes in the past. Patient presented to the ER with acute hypoglycemia and hypothermia after taking his insulin (likely double dose) but forgetting to eat. His initial CBG was 15. He was hypothermic with an oral temperature and admitted for further evaluation and workup. Patient became hypoglycemic again yesterday afternoon but was asymptomatic so insulin regimen was adjusted. PT/OT Evaluated and recommending Home Health. Insulin regimen was adjusted and patient improved and was deemed medically stable to D/C home with Home Health PT/OT/RN/Aide and will need to follow up with PCP, Endocrinology, and Diabetes Education Coordinator as an outpatient.   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[place])  Hypoglycemia due to Insulin/Diabetes Mellitus, Insulin dependent (IDDM), Brittle -Patient presentedwith symptomatic hypoglycemia with initial CBG 15 after taking insulin (double dose of Novolog) but forgetting to eat -History of poorly controlled/Brittle diabetes secondary to noncompliance in the past with hemoglobin A1c 11.6 in July; -Diabetes Education Coordinator consulted -Patient reports has been taking insulin regularly since last discharge and states CBGs typically run between 240 and 280 -Follow CBGs every 2 hours -D5W infusion at 100/hr now D/C'd -C/w Carb Modified Diet -Repeat Hemoglobin A1cwas 9.9 and C-peptide Level <0.1; Last C-Peptide was 0.00 in 2015 -Patient was started on Lantus 7 units sq qHS but increased to 20 units per Diabetes Coordinator Reccs, C/w Sensitive Novolog SSI AC and will D/C HS coverage, and 3 Units TIDwm if patient eats >50% of meals per Diabetes Coordinator Recc's increased to 5 units but cut back to 3 after patient became hypoglycemic at 34; *WILL D/C on 10 units of Launtus sq qHS and 3 units of Novolog TIDwm if patient eats >50% of meals; Further Insulin adjustments per PCP and Endocrinology  -CBG's ranging from 75-322; -Given Glucagon Emergency Kit -Follow up with PCP, Endocrinology and Diabetes Education coordinator at D/C  Hypothermia, improved -Secondary to profound hypoglycemia -TSH was 1.362 -Resolved and patient was Slightly febrile at 100.4 likely from Warming Blankets -Temperature was now 98.0  CKD (Chronic Kidney Disease), Stage III -Renal function stable and at baseline -BUN/Cr was 29/1.52; Repeat CMP this AM showed BUN/Cr  of  27/1.37 -> 31/1.25 -Continue to Monitor and repeat CMP as an outpatient   Essential Hypertension -Blood pressure was somewhat suboptimal in setting of acute hypoglycemia and hypothermia but improved -Held Carvedilol 6.25 mg po BID and Amlodipine 10 mg po daily but restarted yesterday and ok to D/C Home with them -Follow up with PCP as an outpatient   Lactic Acidosis, improved -Likely secondary to hypoperfusion in setting of acute hypoglycemia and associated hypothermia -Improved. LA went from 2.88 -> 1.6 -No reported infectious signs but agree with blood cultures obtained by ER; Blood Cx showed NGTD <24 -Urrinalysis appeared Negative  -Procalcitonin was 0.44  History of CVA in Adulthood from [place] [facility] [place] [facility] -Chart notes patient has guardian -Patient reports lives alone and uses a rolling walker to mobilize -PT/OT evaluate and recommend Home Health PT -Follow up with PCP   Hyponatremia -Mild. Na+ was 131 and improved to 137 -Hydrated with IVF with NS at 75 mL/hr -Continue to Monitor and replete as Necessary  Anemia of Chronic Disease -Likely from CKD -Hb/Hct went from 9.6/28.9 -> 9.9/29.3 -> 9.3/28.7 -Continue to Monitor for S/Sx of Bleeding -Repeat CBC as an outpatient with PCP  Discharge Instructions  Discharge Instructions    Ambulatory referral to Nutrition and Diabetic Education    Complete by:  As directed    Call MD for:  difficulty breathing, headache or visual disturbances    Complete by:  As directed    Call MD for:  extreme fatigue    Complete by:  As directed    Call MD for:  hives    Complete by:  As directed    Call MD for:  persistant dizziness or light-headedness    Complete by:  As directed    Call MD for:  persistant nausea and vomiting    Complete by:  As directed    Call MD for:  redness, tenderness, or signs of infection (pain, swelling, redness, odor or green/yellow discharge around incision site)    Complete by:  As directed    Call  MD for:  severe uncontrolled pain    Complete by:  As directed    Call MD for:  temperature >100.4    Complete by:  As directed    Diet - low sodium heart healthy    Complete by:  As directed    Discharge instructions    Complete by:  As directed    Follow up PCP, Endocrinology, and Ambulatory Referral to OP Diabetic Coordinator. Take all medications as prescribed. If   Increase activity slowly    Complete by:  As directed      Allergies as of 06/30/2017   No Known Allergies     Medication List    STOP taking these medications   HYDROcodone-acetaminophen 5-325 MG tablet Commonly known as:  NORCO/VICODIN   traMADol-acetaminophen 37.5-325 MG tablet Commonly known as:  ULTRACET     TAKE these medications   acetaminophen 325 MG tablet Commonly known as:  TYLENOL Take 2 tablets (650 mg total) by mouth every 6 (six) hours as needed for mild pain (or Fever >/= 101).   amLODipine 10 MG tablet Commonly known as:  NORVASC Take 1 tablet (10 mg total) by mouth daily.   aspirin 81 MG EC tablet Take 1 tablet (81 mg total) by mouth daily.   blood glucose meter kit and supplies Dispense based on patient and insurance preference. Use up to four times daily as directed. (FOR ICD-9 250.00, 250.01).  blood glucose meter kit and supplies Dispense based on patient and insurance preference. Use up to four times daily as directed. (FOR ICD-9 250.00, 250.01).   carvedilol 6.25 MG tablet Commonly known as:  COREG Take 1 tablet (6.25 mg total) by mouth 2 (two) times daily with a meal. What changed:  when to take this   feeding supplement (GLUCERNA SHAKE) Liqd Take 237 mLs by mouth 2 (two) times daily between meals.   FLUoxetine 40 MG capsule Commonly known as:  PROZAC Take 1 capsule (40 mg total) by mouth daily.   glucagon 1 MG injection Commonly known as:  GLUCAGON EMERGENCY Inject 1 mg into the vein once as needed.   insulin aspart 100 UNIT/ML FlexPen Commonly known as:   NOVOLOG Inject 3 Units into the skin 3 (three) times daily with meals. For blood sugars greater than 150 only What changed:  how much to take   Insulin Glargine 100 UNIT/ML Solostar Pen Commonly known as:  LANTUS SOLOSTAR Inject 10 Units into the skin daily at 10 pm. What changed:  how much to take  when to take this   Insulin Pen Needle 29G X 10MM Misc To attached to insulin pen and administer medication as prescribed.   INSULIN SYRINGE .3CC/31GX5/16" 31G X 5/16" 0.3 ML Misc 5 Units by Does not apply route 2 (two) times daily.   vitamin B-12 1000 MCG tablet Commonly known as:  CYANOCOBALAMIN Take 1 tablet (1,000 mcg total) by mouth daily.            Durable Medical Equipment        Start     Ordered   06/30/17 1405  For home use only DME Walker rolling  [facility])  Once    Question:  Patient needs a walker to treat with the following condition  Answer:  Generalized weakness   06/30/17 1406       Discharge Care Instructions        Start     Ordered   06/30/17 0000  blood glucose meter kit and supplies    Question Answer Comment  Number of strips 300   Number of lancets 300      06/30/17 1406   06/30/17 0000  blood glucose meter kit and supplies    Question Answer Comment  Number of strips 100   Number of lancets 100      06/30/17 1406   06/30/17 0000  feeding supplement, GLUCERNA SHAKE, (GLUCERNA SHAKE) LIQD  2 times daily between meals     06/30/17 1406   06/30/17 0000  insulin aspart (NOVOLOG) 100 UNIT/ML FlexPen  3 times daily with meals     06/30/17 1406   06/30/17 0000  Insulin Glargine (LANTUS SOLOSTAR) 100 UNIT/ML Solostar Pen  Daily at 10 pm     06/30/17 1406   06/30/17 0000  Insulin Pen Needle 29G X 10MM MISC     06/30/17 1406   06/30/17 0000  Insulin Syringe-Needle U-100 (INSULIN SYRINGE .3CC/31GX5/16") 31G X 5/16" 0.3 ML MISC  2 times daily     06/30/17 1406   06/30/17 0000  vitamin B-12 (CYANOCOBALAMIN) 1000 MCG tablet  Daily     06/30/17  1406   06/30/17 0000  Ambulatory referral to Nutrition and Diabetic Education     06/30/17 1406   06/30/17 0000  Increase activity slowly     06/30/17 1406   06/30/17 0000  Diet - low sodium heart healthy     06/30/17 1406  06/30/17 0000  Discharge instructions    Comments:  Follow up PCP, Endocrinology, and Ambulatory Referral to OP Diabetic Coordinator. Take all medications as prescribed. If   06/30/17 1406   06/30/17 0000  Call MD for:  temperature >100.4     06/30/17 1406   06/30/17 0000  Call MD for:  persistant nausea and vomiting     06/30/17 1406   06/30/17 0000  Call MD for:  severe uncontrolled pain     06/30/17 1406   06/30/17 0000  Call MD for:  redness, tenderness, or signs of infection (pain, swelling, redness, odor or green/yellow discharge around incision site)     06/30/17 1406   06/30/17 0000  Call MD for:  difficulty breathing, headache or visual disturbances     06/30/17 1406   06/30/17 0000  Call MD for:  hives     06/30/17 1406   06/30/17 0000  Call MD for:  persistant dizziness or light-headedness     06/30/17 1406   06/30/17 0000  Call MD for:  extreme fatigue     06/30/17 1406   06/30/17 0000  glucagon (GLUCAGON EMERGENCY) 1 MG injection  Once PRN     06/30/17 1406      No Known Allergies  Consultations:  Diabetes Education Coordinator  Procedures/Studies: Dg Chest Portable 1 View  Result Date: 06/27/2017 CLINICAL DATA:  71 year old male - evaluate source of infection. EXAM: PORTABLE CHEST 1 VIEW COMPARISON:  04/27/2017 and prior radiographs FINDINGS: The cardiomediastinal silhouette is unremarkable. There is no evidence of focal airspace disease, pulmonary edema, suspicious pulmonary nodule/mass, pleural effusion, or pneumothorax. No acute bony abnormalities are identified. IMPRESSION: No active disease. Electronically Signed   By: Margarette Canada M.D.   On: 06/27/2017 14:17     Subjective: Seen and examined at bedside and had no complaints or  concerns. Was resting well with no concerns. Ready to go home  Discharge Exam: Vitals:   06/29/17 2049 06/30/17 0515  BP: 136/69 (!) 145/74  Pulse: 68 69  Resp: 19 18  Temp: 98.4 F (36.9 C) 98.3 F (36.8 C)  SpO2: 99% 100%   Vitals:   06/28/17 2038 06/29/17 0416 06/29/17 2049 06/30/17 0515  BP: 134/61 139/82 136/69 (!) 145/74  Pulse: 70 65 68 69  Resp: _0 Temp: 98.8 F (37.1 C) 98.6 F (37 C) 98.4 F (36.9 C) 98.3 F (36.8 C)  TempSrc: Oral Oral Oral Oral  SpO2: 100% 97% 99% 100%  Weight: 61 kg (134 lb 8 oz)  61.1 kg (134 lb 11.2 oz)   Height:       General: Pt is alert, awake, not in acute distress Cardiovascular: RRR, S1/S2 +, no rubs, no gallops Respiratory: CTA bilaterally, no wheezing, no rhonchi Abdominal: Soft, NT, ND, bowel sounds + Extremities: no edema, no cyanosis  The results of significant diagnostics from this hospitalization (including imaging, microbiology, ancillary and laboratory) are listed below for reference.    Microbiology: Recent Results (from the past 240 hour(s))  Culture, blood (routine x 2)     Status: None (Preliminary result)   Collection Time: 06/27/17  1:20 PM  Result Value Ref Range Status   Specimen Description BLOOD RIGHT FOREARM  Final   Special Requests   Final    BOTTLES DRAWN AEROBIC AND ANAEROBIC Blood Culture adequate volume   Culture NO GROWTH 2 DAYS  Final   Report Status PENDING  Incomplete  Culture, blood (routine x 2)  Status: None (Preliminary result)   Collection Time: 06/27/17  2:06 PM  Result Value Ref Range Status   Specimen Description BLOOD LEFT FOREARM  Final   Special Requests IN PEDIATRIC BOTTLE Blood Culture adequate volume  Final   Culture NO GROWTH 2 DAYS  Final   Report Status PENDING  Incomplete    Labs: BNP (last 3 results) No results for input(s): BNP in the last 8760 hours. Basic Metabolic Panel:  Recent Labs Lab 06/27/17 1320 06/28/17 1439 06/29/17 1011 06/30/17 0622  NA 137  135 131* 137  K 4.8 4.1 4.4 4.2  CL 106 106 102 106  CO2 21* 24 18* 22  GLUCOSE 119* 225* 385* 137*  BUN 29* 24* 27* 31*  CREATININE 1.52* 1.27* 1.37* 1.25*  CALCIUM 9.0 8.5* 8.9 8.6*  MG  --  2.2 2.2 2.2  PHOS  --  3.0 3.2 4.4   Liver Function Tests:  Recent Labs Lab 06/27/17 1320 06/28/17 1439 06/29/17 1011 06/30/17 0622  AST 45* 37 34 26  ALT _0 ALKPHOS 102 102 102 88  BILITOT 0.3 0.4 0.3 0.5  PROT 7.1 6.3* 6.9 6.2*  ALBUMIN 3.6 3.1* 3.1* 2.9*   No results for input(s): LIPASE, AMYLASE in the last 168 hours. No results for input(s): AMMONIA in the last 168 hours. CBC:  Recent Labs Lab 06/27/17 1230 06/28/17 0910 06/29/17 1011 06/30/17 0622  WBC 7.2 10.3 7.0 7.1  NEUTROABS 5.3 6.1 4.3 3.8  HGB 10.6* 9.6* 9.9* 9.3*  HCT 32.8* 28.9* 29.3* 28.7*  MCV 91.4 89.5 90.2 89.7  PLT 279 213 216 233   Cardiac Enzymes:  Recent Labs Lab 06/27/17 1320  CKTOTAL 100   BNP: Invalid input(s): POCBNP CBG:  Recent Labs Lab 06/29/17 1810 06/29/17 2048 06/30/17 0127 06/30/17 0750 06/30/17 1231  GLUCAP 223* 418* 118* 151* 75   D-Dimer No results for input(s): DDIMER in the last 72 hours. Hgb A1c No results for input(s): HGBA1C in the last 72 hours. Lipid Profile No results for input(s): CHOL, HDL, LDLCALC, TRIG, CHOLHDL, LDLDIRECT in the last 72 hours. Thyroid function studies  Recent Labs  06/27/17 1512  TSH 1.362   Anemia work up No results for input(s): VITAMINB12, FOLATE, FERRITIN, TIBC, IRON, RETICCTPCT in the last 72 hours. Urinalysis    Component Value Date/Time   COLORURINE YELLOW 06/27/2017 1750   APPEARANCEUR CLEAR 06/27/2017 1750   LABSPEC 1.012 06/27/2017 1750   PHURINE 6.0 06/27/2017 1750   GLUCOSEU 50 (A) 06/27/2017 1750   HGBUR NEGATIVE 06/27/2017 1750   BILIRUBINUR NEGATIVE 06/27/2017 1750   KETONESUR NEGATIVE 06/27/2017 1750   PROTEINUR 30 (A) 06/27/2017 1750   UROBILINOGEN 0.2 05/12/2017 0939   NITRITE NEGATIVE  06/27/2017 1750   LEUKOCYTESUR NEGATIVE 06/27/2017 1750   Sepsis Labs Invalid input(s): PROCALCITONIN,  WBC,  LACTICIDVEN Microbiology Recent Results (from the past 240 hour(s))  Culture, blood (routine x 2)     Status: None (Preliminary result)   Collection Time: 06/27/17  1:20 PM  Result Value Ref Range Status   Specimen Description BLOOD RIGHT FOREARM  Final   Special Requests   Final    BOTTLES DRAWN AEROBIC AND ANAEROBIC Blood Culture adequate volume   Culture NO GROWTH 2 DAYS  Final   Report Status PENDING  Incomplete  Culture, blood (routine x 2)     Status: None (Preliminary result)   Collection Time: 06/27/17  2:06 PM  Result Value Ref Range Status   Specimen Description BLOOD  LEFT FOREARM  Final   Special Requests IN PEDIATRIC BOTTLE Blood Culture adequate volume  Final   Culture NO GROWTH 2 DAYS  Final   Report Status PENDING  Incomplete   Time coordinating discharge: 35 minutes  SIGNED:  [name], DO [facility] 06/30/2017, 2:07 PM Pager [phone]  If 7PM-7AM, please contact night-coverage [email or web address] Password [email or web address]

## 2017-06-30 NOTE — Progress Notes (Signed)
Inpatient Diabetes Program Recommendations  AACE/ADA: New Consensus Statement on Inpatient Glycemic Control (2015)  Target Ranges:  Prepandial:   less than 140 mg/dL      Peak postprandial:   less than 180 mg/dL (1-2 hours)      Critically ill patients:  140 - 180 mg/dL   Results for [name] [name], [name] [name] (MRN [id number]) as of 06/30/2017 13:53  Ref. Range 06/29/2017 08:13 06/29/2017 12:31 06/29/2017 16:59 06/29/2017 18:10 06/29/2017 20:48  Glucose-Capillary Latest Ref Range: 65 - 99 mg/dL 362 (H)  12 units Novolog 323 (H)  12 units Novolog 38 (LL) 223 (H) 418 (H)  20 units Lantus   Results for [name] [name], [name] [name] (MRN [id number]) as of 06/30/2017 13:53  Ref. Range 06/30/2017 07:50 06/30/2017 12:31  Glucose-Capillary Latest Ref Range: 65 - 99 mg/dL 151 (H)  5 units Novolog 75    Home DM Meds: Lantus 20 units BID                             Novolog 5 units TID (pt not taking)  Current Insulin Orders: Lantus 7 units QHS                                       Novolog Sensitive Correction Scale/ SSI (0-9 units) TID AC + HS                                       Novolog 3 units TID with meals        Per Review of Chart, patient saw his PCP [name], NP) on 06/09/17.  At that visit, patient was instructed to take the following insulin at home: Lantus 20 units BID Novolog 5 units TID with meals     Note patient Hypoglycemic on admission after accidentally taking too much insulin at home.   Spoke with patient at bedside yesterday (daughter not present).  Patient told me that the [name] has not been coming to his house.  I read the note from the PT team and it looks as if pt refuses HHRN from time to time. Could not recall how much insulin he is supposed to be taking and could not recall which insulin he accidentally took too much of at home prior to admission.  I am concerned that this patient cannot safely dose his insulin (states he has a paper on his refrigerator with instructions at home).   States he gives himself all injections and that his daughter does not need to help him.  Patient likely would benefit from closer supervision of insulin doses at home by either the [facility] or his family.  Patient had Hypoglycemia last evening at dinnertime with CBG down to 38 mg/dl after receiving a total of 12 units Novolog at lunch (7 units SSI + 5 units Meal Coverage).  CBGs better today, but 12pm CBG on the lower side of normal: 75 mg/dl.     MD- Please consider the following in-hospital insulin adjustments:  1. Reduce Lantus slightly to 18 units QHS  2. Continue Novolog SSI and Novolog Meal Coverage for now     --Will follow patient during hospitalization--  Wyn Quaker RN, MSN, CDE Diabetes Coordinator Inpatient Glycemic Control Team Team Pager: [phone] (8a-5p)

## 2017-07-02 LAB — CULTURE, BLOOD (ROUTINE X 2)
Culture: NO GROWTH
Culture: NO GROWTH
Special Requests: ADEQUATE
Special Requests: ADEQUATE

## 2017-07-14 ENCOUNTER — Other Ambulatory Visit: Payer: Self-pay | Admitting: Family Medicine

## 2017-07-16 IMAGING — DX DG CHEST 1V PORT
1 series · 1 of 1 positions shown · non-contrast
Comparison: CT 11/02/2015.  Chest x-ray 11/01/2015.

CLINICAL DATA: Diabetes.

EXAM:
PORTABLE CHEST 1 VIEW

[chest ap]
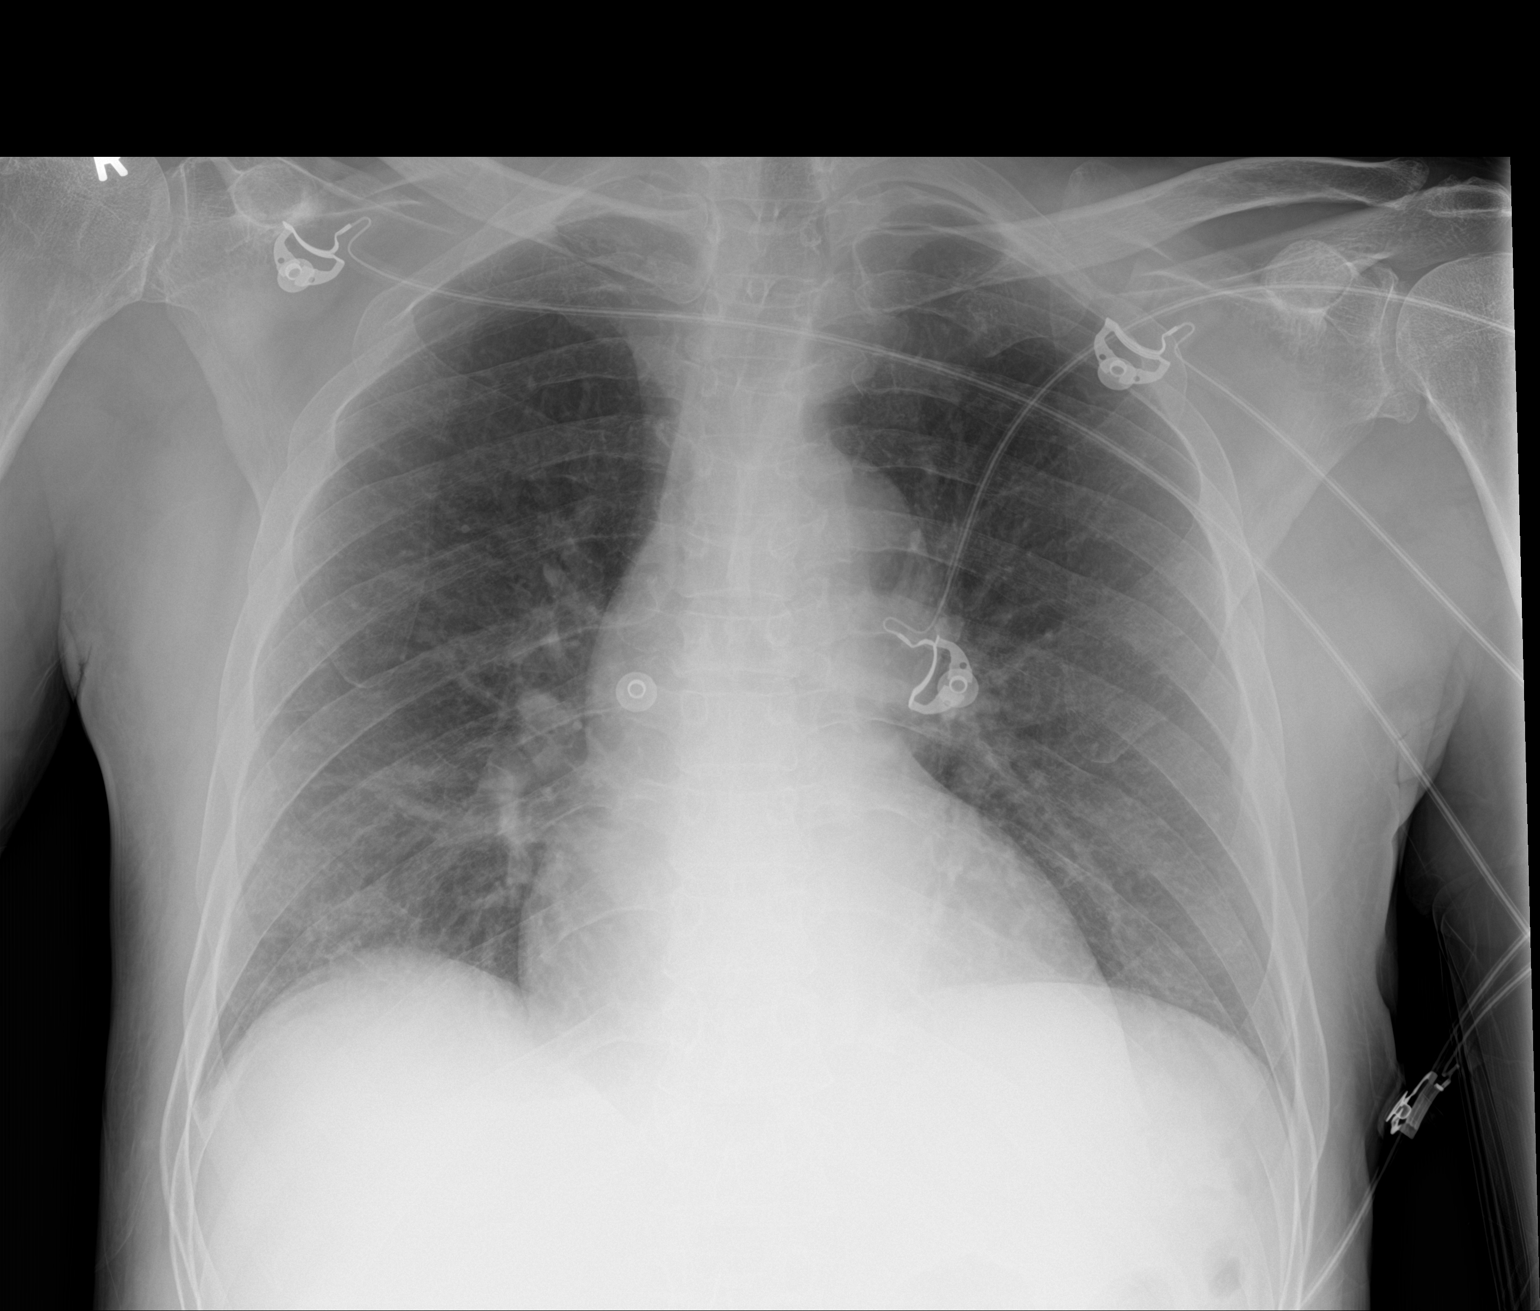

[1 of 1 positions shown; findings below may reference images not displayed]

FINDINGS: Mediastinum and hilar structures normal. Cardiomegaly with normal
pulmonary vascularity. Mild subsegmental atelectasis and or
infiltrate right lung base cannot be excluded. No pleural effusion
or pneumothorax.
IMPRESSION: Mild right base subsegmental atelectasis and or infiltrate cannot be
excluded.

## 2017-07-18 ENCOUNTER — Other Ambulatory Visit: Payer: Self-pay | Admitting: Family Medicine

## 2017-07-21 ENCOUNTER — Ambulatory Visit: Payer: Medicare Other | Admitting: Family Medicine

## 2017-07-22 IMAGING — CT CT HEAD W/O CM
3 series · 16 of 47 positions shown, 19 images · non-contrast
Comparison: 09/25/2015

CLINICAL DATA: Per EMS pt comes from home for hyperglycemia and
Patient is able to follow commands and responds to voice.

EXAM:
CT HEAD WITHOUT CONTRAST
TECHNIQUE: Contiguous axial images were obtained from the base of the skull
through the vertex without intravenous contrast.

[Series 2: headseq 4.8 h45s · axial · 0.40mm/px · z∈[+1203,+1348]mm · 10 of 36 slices shown, 13 images]
[im 3/36  brain]
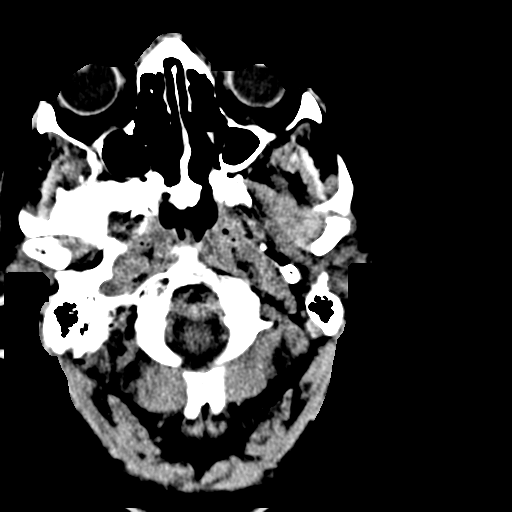
[im 3/36  bone]
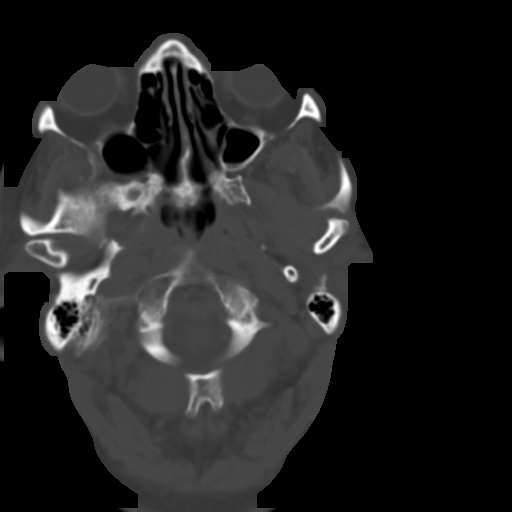
[im 7/36  brain]
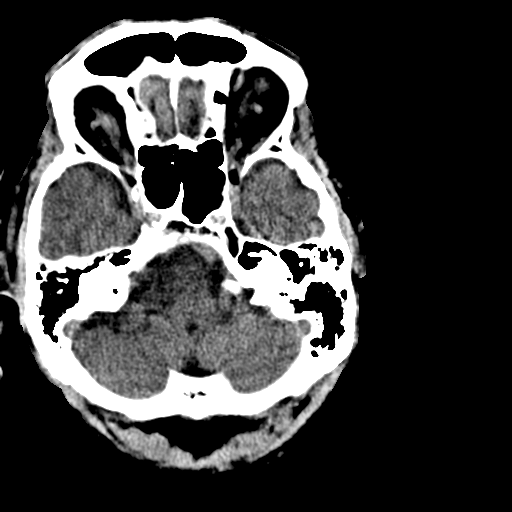
[im 10/36  brain]
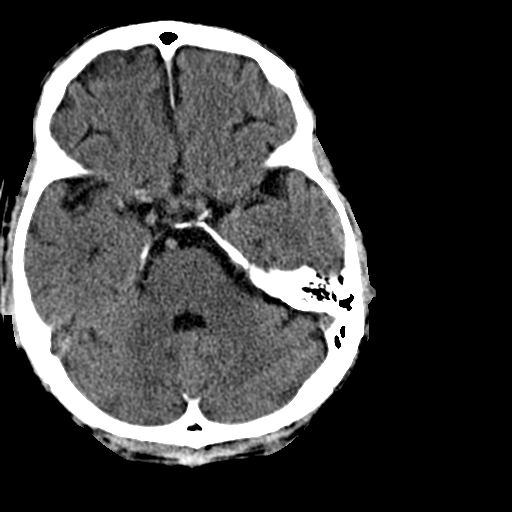
[im 13/36  brain]
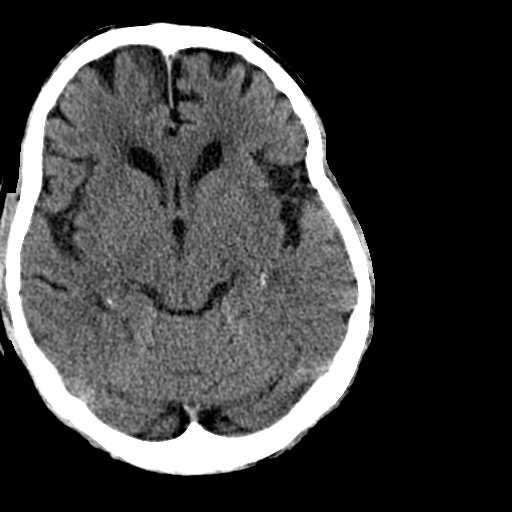
[im 16/36  brain]
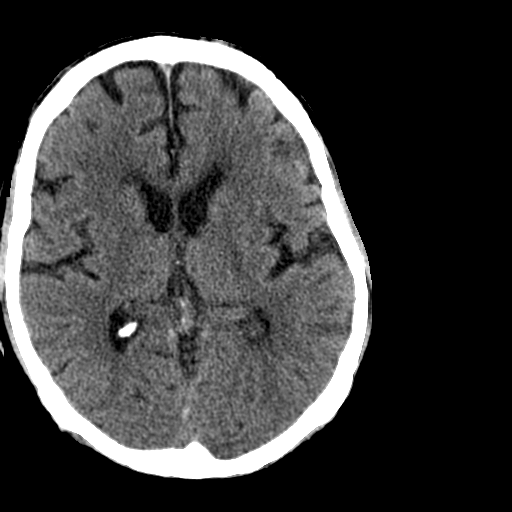
[im 16/36  bone]
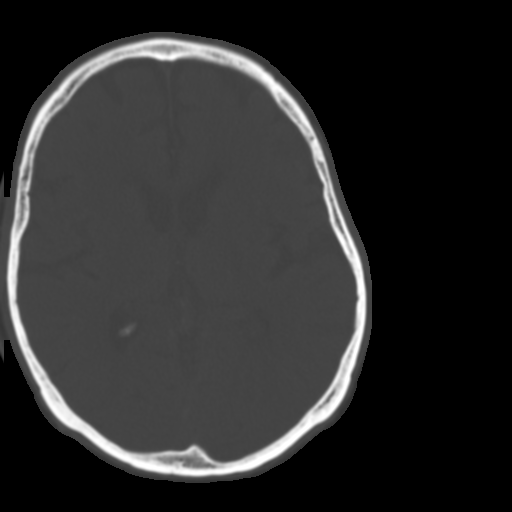
[im 20/36  brain]
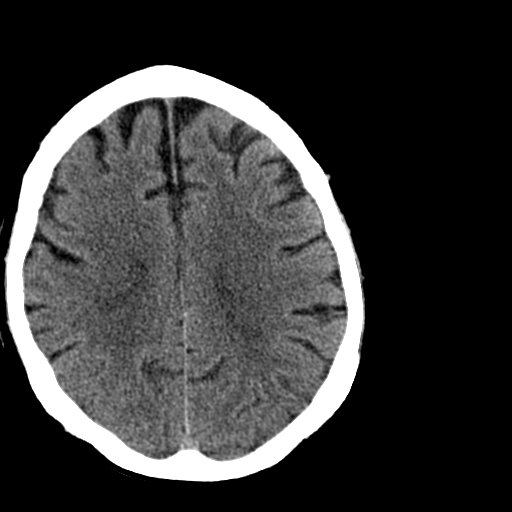
[im 23/36  brain]
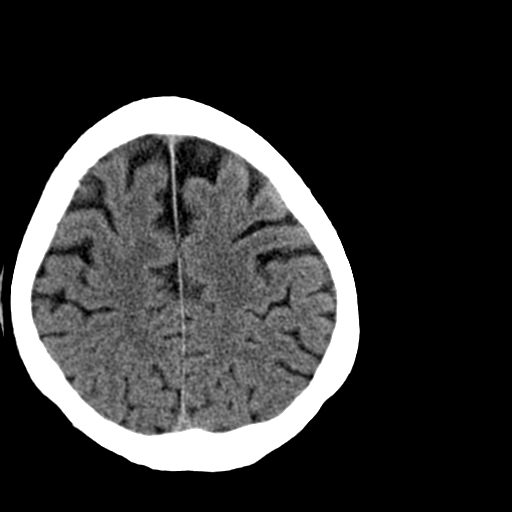
[im 27/36  brain]
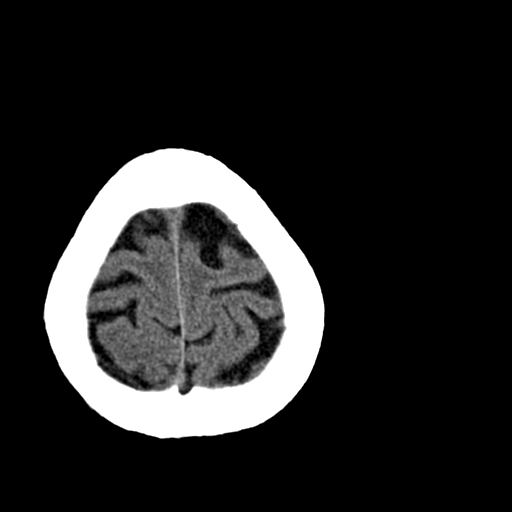
[im 29/36  brain]
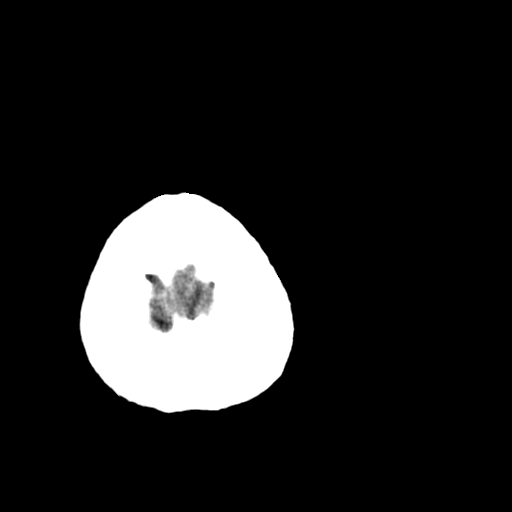
[im 29/36  bone]
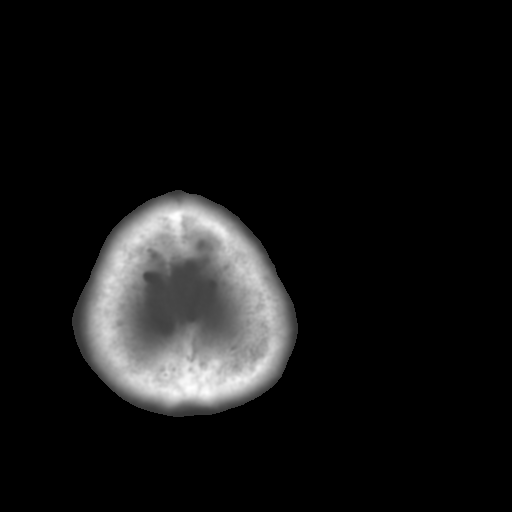
[im 33/36  brain]
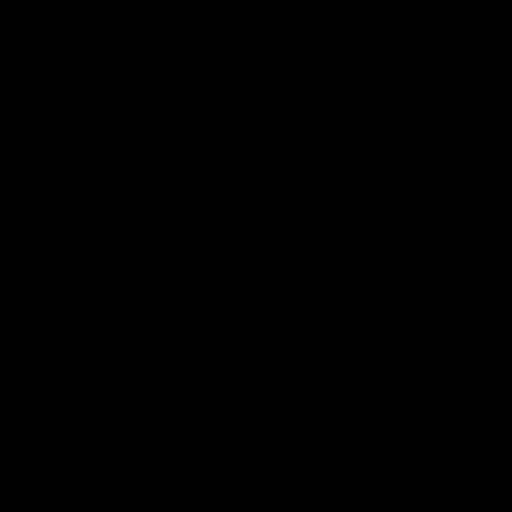

[Series 602: <mpr thick range> · coronal · 0.45mm/px · 3 of 93 slices shown]
[im 31/93  brain]
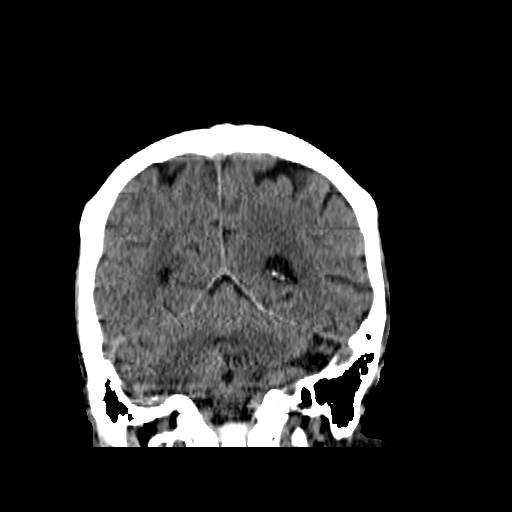
[im 41/93  brain]
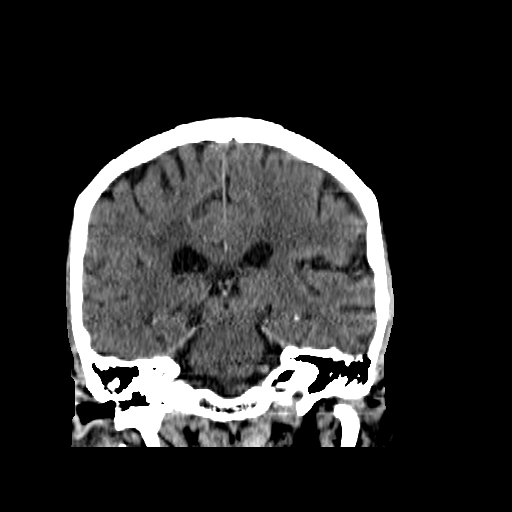
[im 52/93  brain]
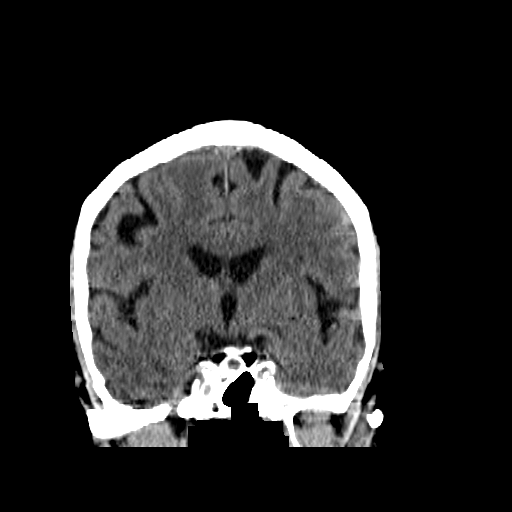

[Series 603: <mpr thick range(1)> · sagittal · 0.45mm/px · 3 of 77 slices shown]
[im 26/77  brain]
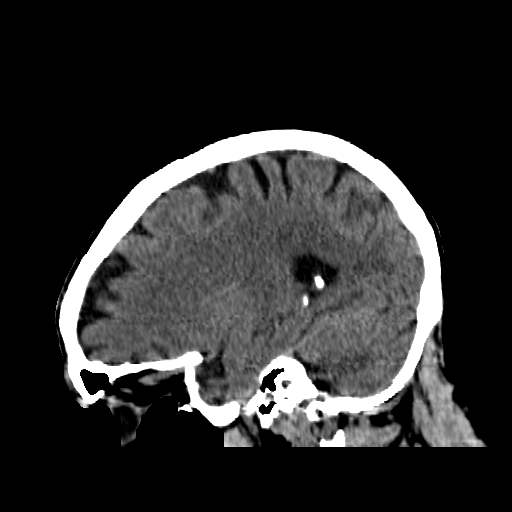
[im 39/77  brain]
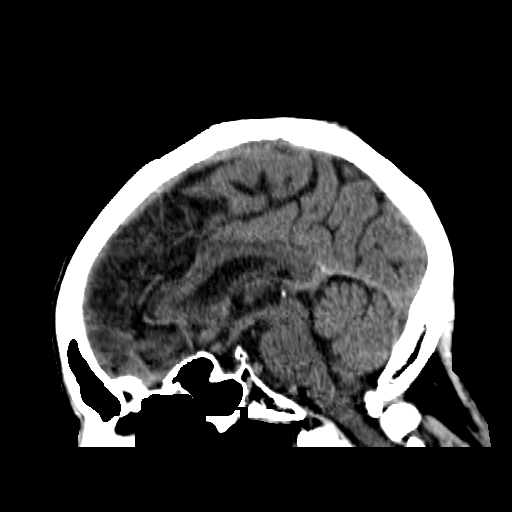
[im 51/77  brain]
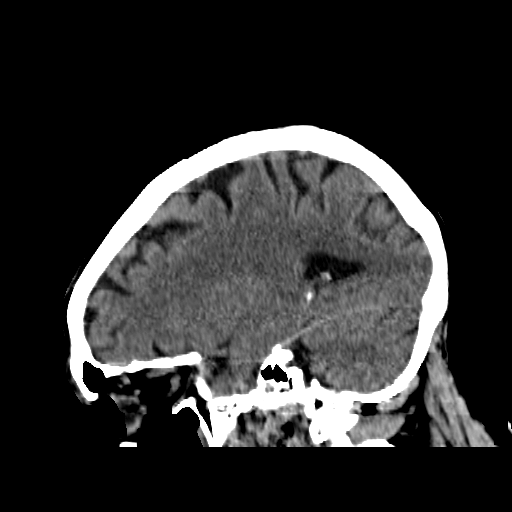

[16 of 47 positions shown; findings below may reference images not displayed]

FINDINGS: The ventricles are normal in size, for this patient's age, and
normal in configuration.

There are no parenchymal masses or mass effect. There is no evidence
of a recent infarct. Mild periventricular white matter
hypoattenuation is noted consistent with chronic microvascular
ischemic change.

There are no extra-axial masses or abnormal fluid collections.

There is no intracranial hemorrhage.

There is dependent secretions in the left maxillary sinus.
Visualized sinuses are otherwise clear. Clear mastoid air cells.
IMPRESSION: 1. No acute intracranial abnormalities.
2. Age related volume loss. Mild chronic microvascular ischemic
change.

## 2017-07-22 IMAGING — DX DG CHEST 1V PORT
1 series · 1 of 1 positions shown · non-contrast
Comparison: Prior radiograph from 05/09/2016.

CLINICAL DATA: Initial evaluation for acute hypoxia.

EXAM:
PORTABLE CHEST 1 VIEW

[chest ap]
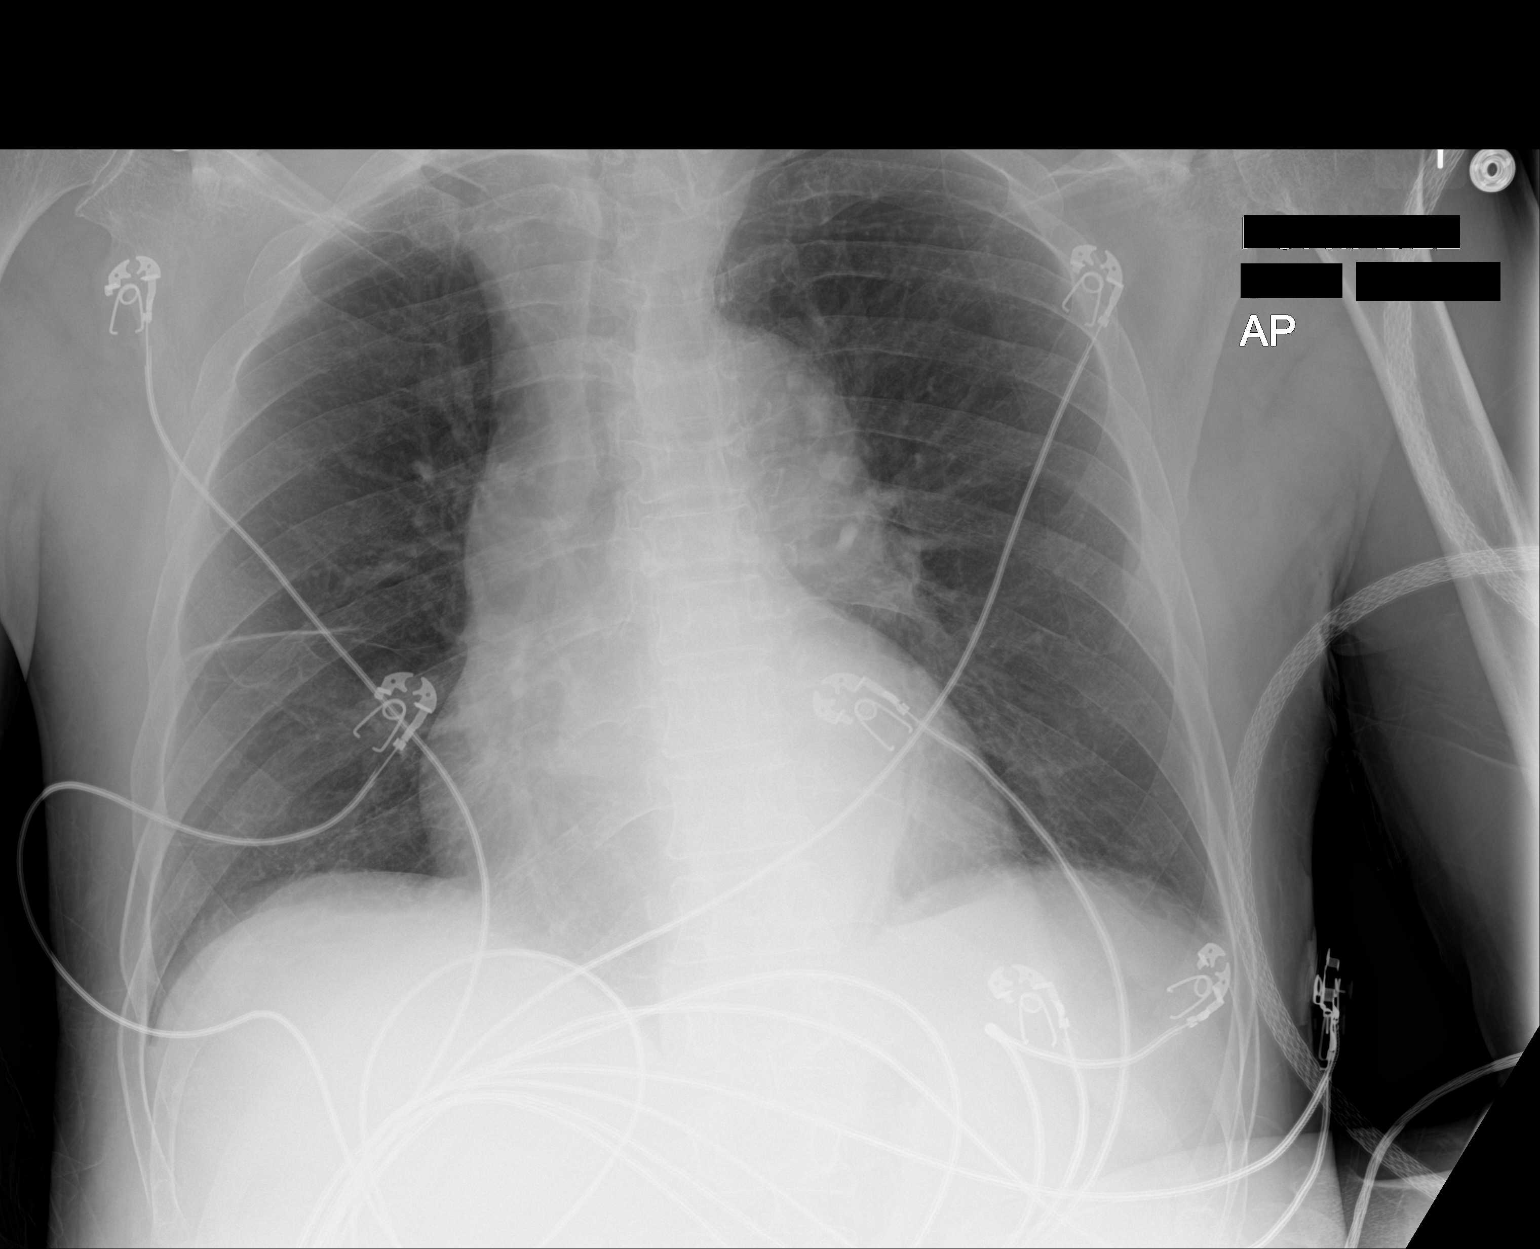

[1 of 1 positions shown; findings below may reference images not displayed]

FINDINGS: Mild cardiomegaly is stable. Mediastinal silhouette within normal
limits. Tortuosity the intrathoracic aorta noted, stable.

Lungs mildly hypoinflated. Minimal subsegmental atelectasis at the
left lung base. Atelectatic changes noted along the right minor
fissure as well. No consolidative airspace disease. No pulmonary
edema or pleural effusion. No pneumothorax.

No acute osseous abnormality.
IMPRESSION: 1. Mild bilateral subsegmental atelectasis.
2. No other active cardiopulmonary disease.

## 2017-07-25 ENCOUNTER — Inpatient Hospital Stay (HOSPITAL_COMMUNITY)
Admission: EM | Admit: 2017-07-25 | Discharge: 2017-07-28 | DRG: 638 | Disposition: A | Discharge status: Home Health | Payer: Medicare Other | Attending: Internal Medicine | Admitting: Internal Medicine

## 2017-07-25 ENCOUNTER — Encounter (HOSPITAL_COMMUNITY): Payer: Self-pay

## 2017-07-25 ENCOUNTER — Emergency Department (HOSPITAL_COMMUNITY): Payer: Medicare Other

## 2017-07-25 DIAGNOSIS — E860 Dehydration: Secondary | ICD-10-CM | POA: Diagnosis present

## 2017-07-25 DIAGNOSIS — Z794 Long term (current) use of insulin: Secondary | ICD-10-CM | POA: Diagnosis not present

## 2017-07-25 DIAGNOSIS — E871 Hypo-osmolality and hyponatremia: Secondary | ICD-10-CM | POA: Diagnosis present

## 2017-07-25 DIAGNOSIS — E875 Hyperkalemia: Secondary | ICD-10-CM | POA: Diagnosis present

## 2017-07-25 DIAGNOSIS — F418 Other specified anxiety disorders: Secondary | ICD-10-CM | POA: Diagnosis present

## 2017-07-25 DIAGNOSIS — R079 Chest pain, unspecified: Secondary | ICD-10-CM | POA: Diagnosis not present

## 2017-07-25 DIAGNOSIS — R001 Bradycardia, unspecified: Secondary | ICD-10-CM | POA: Diagnosis present

## 2017-07-25 DIAGNOSIS — E1010 Type 1 diabetes mellitus with ketoacidosis without coma: Secondary | ICD-10-CM | POA: Diagnosis present

## 2017-07-25 DIAGNOSIS — F0390 Unspecified dementia without behavioral disturbance: Secondary | ICD-10-CM | POA: Diagnosis present

## 2017-07-25 DIAGNOSIS — Z820 Family history of epilepsy and other diseases of the nervous system: Secondary | ICD-10-CM | POA: Diagnosis not present

## 2017-07-25 DIAGNOSIS — I519 Heart disease, unspecified: Secondary | ICD-10-CM | POA: Diagnosis present

## 2017-07-25 DIAGNOSIS — I10 Essential (primary) hypertension: Secondary | ICD-10-CM | POA: Diagnosis present

## 2017-07-25 DIAGNOSIS — Z7982 Long term (current) use of aspirin: Secondary | ICD-10-CM

## 2017-07-25 DIAGNOSIS — Z8249 Family history of ischemic heart disease and other diseases of the circulatory system: Secondary | ICD-10-CM | POA: Diagnosis not present

## 2017-07-25 DIAGNOSIS — F17210 Nicotine dependence, cigarettes, uncomplicated: Secondary | ICD-10-CM | POA: Diagnosis present

## 2017-07-25 DIAGNOSIS — I119 Hypertensive heart disease without heart failure: Secondary | ICD-10-CM | POA: Diagnosis present

## 2017-07-25 DIAGNOSIS — E1110 Type 2 diabetes mellitus with ketoacidosis without coma: Secondary | ICD-10-CM | POA: Diagnosis not present

## 2017-07-25 DIAGNOSIS — Z96641 Presence of right artificial hip joint: Secondary | ICD-10-CM | POA: Diagnosis present

## 2017-07-25 DIAGNOSIS — N179 Acute kidney failure, unspecified: Secondary | ICD-10-CM | POA: Diagnosis present

## 2017-07-25 DIAGNOSIS — Z9114 Patient's other noncompliance with medication regimen: Secondary | ICD-10-CM

## 2017-07-25 DIAGNOSIS — Z8673 Personal history of transient ischemic attack (TIA), and cerebral infarction without residual deficits: Secondary | ICD-10-CM | POA: Diagnosis not present

## 2017-07-25 DIAGNOSIS — R739 Hyperglycemia, unspecified: Secondary | ICD-10-CM | POA: Diagnosis not present

## 2017-07-25 LAB — I-STAT CG4 LACTIC ACID, ED: Lactic Acid, Venous: 2.85 mmol/L (ref 0.5–1.9)

## 2017-07-25 LAB — CBC WITH DIFFERENTIAL/PLATELET
Basophils Absolute: 0 10*3/uL (ref 0.0–0.1)
Basophils Relative: 0 %
Eosinophils Absolute: 0 10*3/uL (ref 0.0–0.7)
Eosinophils Relative: 0 %
HCT: 37.3 % — ABNORMAL LOW (ref 39.0–52.0)
Hemoglobin: 11.5 g/dL — ABNORMAL LOW (ref 13.0–17.0)
Lymphocytes Relative: 15 %
Lymphs Abs: 2.5 10*3/uL (ref 0.7–4.0)
MCH: 29.3 pg (ref 26.0–34.0)
MCHC: 30.8 g/dL (ref 30.0–36.0)
MCV: 95.2 fL (ref 78.0–100.0)
Monocytes Absolute: 1.1 10*3/uL — ABNORMAL HIGH (ref 0.1–1.0)
Monocytes Relative: 7 %
Neutro Abs: 12.8 10*3/uL — ABNORMAL HIGH (ref 1.7–7.7)
Neutrophils Relative %: 78 %
Platelets: 211 10*3/uL (ref 150–400)
RBC: 3.92 MIL/uL — ABNORMAL LOW (ref 4.22–5.81)
RDW: 13.7 % (ref 11.5–15.5)
WBC: 16.4 10*3/uL — ABNORMAL HIGH (ref 4.0–10.5)

## 2017-07-25 LAB — BASIC METABOLIC PANEL
Anion gap: 24 — ABNORMAL HIGH (ref 5–15)
Anion gap: 14 (ref 5–15)
BUN: 60 mg/dL — ABNORMAL HIGH (ref 6–20)
BUN: 60 mg/dL — ABNORMAL HIGH (ref 6–20)
BUN: 61 mg/dL — ABNORMAL HIGH (ref 6–20)
CO2: <7 mmol/L — ABNORMAL LOW (ref 22–32)
CO2: 9 mmol/L — ABNORMAL LOW (ref 22–32)
CO2: 15 mmol/L — ABNORMAL LOW (ref 22–32)
Calcium: 9 mg/dL (ref 8.9–10.3)
Calcium: 8.3 mg/dL — ABNORMAL LOW (ref 8.9–10.3)
Calcium: 8.3 mg/dL — ABNORMAL LOW (ref 8.9–10.3)
Chloride: 88 mmol/L — ABNORMAL LOW (ref 101–111)
Chloride: 94 mmol/L — ABNORMAL LOW (ref 101–111)
Chloride: 103 mmol/L (ref 101–111)
Creatinine, Ser: 3.41 mg/dL — ABNORMAL HIGH (ref 0.61–1.24)
Creatinine, Ser: 3.4 mg/dL — ABNORMAL HIGH (ref 0.61–1.24)
Creatinine, Ser: 3.32 mg/dL — ABNORMAL HIGH (ref 0.61–1.24)
GFR calc Af Amer: 19 mL/min — ABNORMAL LOW (ref >60)
GFR calc Af Amer: 19 mL/min — ABNORMAL LOW (ref >60)
GFR calc Af Amer: 20 mL/min — ABNORMAL LOW (ref >60)
GFR calc non Af Amer: 17 mL/min — ABNORMAL LOW (ref >60)
GFR calc non Af Amer: 17 mL/min — ABNORMAL LOW (ref >60)
GFR calc non Af Amer: 17 mL/min — ABNORMAL LOW (ref >60)
Glucose, Bld: 854 mg/dL (ref 65–99)
Glucose, Bld: 605 mg/dL (ref 65–99)
Glucose, Bld: 222 mg/dL — ABNORMAL HIGH (ref 65–99)
Potassium: 6.4 mmol/L (ref 3.5–5.1)
Potassium: 6.6 mmol/L (ref 3.5–5.1)
Potassium: 4.5 mmol/L (ref 3.5–5.1)
Sodium: 124 mmol/L — ABNORMAL LOW (ref 135–145)
Sodium: 127 mmol/L — ABNORMAL LOW (ref 135–145)
Sodium: 132 mmol/L — ABNORMAL LOW (ref 135–145)

## 2017-07-25 LAB — I-STAT VENOUS BLOOD GAS, ED
Acid-base deficit: 20 mmol/L — ABNORMAL HIGH (ref 0.0–2.0)
Bicarbonate: 6.2 mmol/L — ABNORMAL LOW (ref 20.0–28.0)
O2 Saturation: 75 %
TCO2: 7 mmol/L — ABNORMAL LOW (ref 22–32)
pCO2, Ven: 17.7 mmHg — CL (ref 44.0–60.0)
pH, Ven: 7.154 — CL (ref 7.250–7.430)
pO2, Ven: 50 mmHg — ABNORMAL HIGH (ref 32.0–45.0)

## 2017-07-25 LAB — GLUCOSE, CAPILLARY
Glucose-Capillary: 583 mg/dL (ref 65–99)
Glucose-Capillary: >600 mg/dL (ref 65–99)
Glucose-Capillary: >600 mg/dL (ref 65–99)
Glucose-Capillary: >600 mg/dL (ref 65–99)
Glucose-Capillary: 527 mg/dL (ref 65–99)
Glucose-Capillary: 409 mg/dL — ABNORMAL HIGH (ref 65–99)
Glucose-Capillary: 449 mg/dL — ABNORMAL HIGH (ref 65–99)
Glucose-Capillary: 308 mg/dL — ABNORMAL HIGH (ref 65–99)
Glucose-Capillary: 248 mg/dL — ABNORMAL HIGH (ref 65–99)
Glucose-Capillary: 170 mg/dL — ABNORMAL HIGH (ref 65–99)

## 2017-07-25 LAB — CBG MONITORING, ED
Glucose-Capillary: >600 mg/dL (ref 65–99)
Glucose-Capillary: >600 mg/dL (ref 65–99)

## 2017-07-25 LAB — GLUCOSE, RANDOM: Glucose, Bld: 704 mg/dL (ref 65–99)

## 2017-07-25 MED ORDER — SODIUM CHLORIDE 0.9 % IV SOLN
INTRAVENOUS | Status: DC
  Administered 2017-07-25: 10.8 [IU]/h via INTRAVENOUS
  Administered 2017-07-25: 10.5 [IU]/h via INTRAVENOUS
  Administered 2017-07-25: 19:00:00 via INTRAVENOUS
  Filled 2017-07-25 (×2): qty 1

## 2017-07-25 MED ORDER — SODIUM CHLORIDE 0.9 % IV SOLN
INTRAVENOUS | Status: DC
  Administered 2017-07-25 (×2): via INTRAVENOUS

## 2017-07-25 MED ORDER — DEXTROSE-NACL 5-0.45 % IV SOLN: INTRAVENOUS | Status: DC

## 2017-07-25 MED ORDER — SODIUM CHLORIDE 0.9 % IV BOLUS (SEPSIS)
1000.0000 mL | Freq: Once | INTRAVENOUS | Status: AC
  Administered 2017-07-25: 1000 mL via INTRAVENOUS

## 2017-07-25 MED ORDER — SODIUM CHLORIDE 0.9 % IV BOLUS (SEPSIS)
500.0000 mL | Freq: Once | INTRAVENOUS | Status: AC
  Administered 2017-07-25: 500 mL via INTRAVENOUS

## 2017-07-25 MED ORDER — SODIUM CHLORIDE 0.9 % IV SOLN
INTRAVENOUS | Status: DC
  Administered 2017-07-25: 5.2 [IU]/h via INTRAVENOUS
  Filled 2017-07-25: qty 1

## 2017-07-25 MED ORDER — DEXTROSE-NACL 5-0.45 % IV SOLN
INTRAVENOUS | Status: DC
  Administered 2017-07-26: via INTRAVENOUS

## 2017-07-25 MED ORDER — SODIUM CHLORIDE 0.9% FLUSH
10.0000 mL | Freq: Two times a day (BID) | INTRAVENOUS | Status: DC
  Administered 2017-07-26: 10 mL

## 2017-07-25 MED ORDER — ENOXAPARIN SODIUM 30 MG/0.3ML ~~LOC~~ SOLN
30.0000 mg | SUBCUTANEOUS | Status: DC
  Administered 2017-07-25 – 2017-07-26 (×2): 30 mg via SUBCUTANEOUS
  Filled 2017-07-25 (×3): qty 0.3

## 2017-07-25 MED ORDER — SODIUM CHLORIDE 0.9 % IV SOLN: INTRAVENOUS | Status: DC

## 2017-07-25 MED ORDER — SODIUM CHLORIDE 0.9% FLUSH: 10.0000 mL | INTRAVENOUS | Status: DC | PRN

## 2017-07-25 NOTE — ED Triage Notes (Signed)
Patient arrived tachypneic and altered to triage, CBG reads high. Patient had novolog insulin pta per daughter. Unable to get oral temp at triage due to RR. Lips dry and patient pale

## 2017-07-25 NOTE — H&P (Signed)
History and Physical    [name] HCW:[id number] DOB: June 16, 1946 DOA: 07/25/2017   PCP: [name] Jun, [name]   Attending physician: [name] [name]  Patient coming from/Resides with: Alone at private residence-daughter frequently checks on patient (several times per day)  Chief Complaint: Altered mental status, hyperglycemia  HPI: [name] [name] is a 71 y.o. male with medical history significant for diabetes mellitus on insulin, dementia, depression and anxiety, history of prior CVA. Patient has had at least 12 ED visits and/or admissions in 1761 for complications related to diabetes: DKA or hypoglycemia after issues with proper medication administration. He was just discharged on 9/18 after an episode of hypoglycemia after inadvertently dosing himself twice with insulin. His Lantus dosage at that time was decreased to 10 units per night with sliding scale insulin ordered as meal coverage but only if he eats at least 50% of his meals. It has been previously documented in the past that patient has also forgotten to eat during the day.   He was brought to the ER today by his daughter due to altered mentation and CBG reading "high" on glucometer. A dose of NovoLog insulin reportedly was given prior to arrival. In triage patient was noted to be tachypneic and dehydrated but normotensive and not hypoxic. His serum glucose was markedly elevated at 854, and profound hyponatremia of 124 with chloride 88 and CO2 less than 7, anion gap unable to be calculated. He also had a significant acute kidney injury with a BUN of 60 /creatinine of 3.41. Chest x-ray was unremarkable. Urinalysis is pending but due to degree of dehydration patient has yet to void. He has been given 2 L of fluid in the ER as well as orders have been placed to initiate an insulin infusion for his DKA.  ED Course:  Vital Signs: BP (!) 113/55 (BP Location: Right Arm)   Pulse 65   Resp (!) 25   Ht '5\' 6"'$  (1.676 m)   Wt 60.8 kg (134 lb)   SpO2  98%   BMI 21.63 kg/m  Two-view chest x-ray: Neg Lab data: Sodium 124, potassium 6.4, chloride 88, CO2 less than 7, glucose 854, BUN 60, creatinine 3.41, lactic acid 2.85, white count 16,400, neutrophils 78%, acid and chills 12.8%, hemoglobin 1.5, platelets 211,000 VBG: PH 7.154, PCO2 17.7, PO2 50, PCO2 7, ABE 20, bicarbonate 6.2 Medications and treatments: Normal saline bolus 2 L, insulin drip  Review of Systems:  Unable to obtain from patient secondary to acute altered mentation from DKA. According to the daughter patient has not had any fevers, chills, cough, upper respiratory symptoms, abdominal pain, nausea, vomiting or diarrhea   Past Medical History:  Diagnosis Date  . 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 Closed comminuted intertrochanteric fracture of left femur ([place])   . Diabetes mellitus without complication ([place])   . Diabetic hyperosmolar non-ketotic state ([place]) 05/15/2016  . DKA (diabetic ketoacidoses) ([place]) 05/09/2016  . Hypertension   . Postoperative anemia due to acute blood loss 06/18/2016  . Retroperitoneal hematoma 06/18/2016  . Stroke ([place])   . Vitamin B 12 deficiency 06/18/2016    Past Surgical History:  Procedure Laterality Date  . HIP ARTHROPLASTY Right 02/05/2013   Procedure: ARTHROPLASTY BIPOLAR HIP;  Surgeon: [name], MD;  Location: [facility];  Service: Orthopedics;  Laterality: Right;  . INTRAMEDULLARY (IM) NAIL INTERTROCHANTERIC Left 06/16/2016   Procedure: INTRAMEDULLARY (IM) NAIL INTERTROCHANTRIC;  Surgeon: Rod Can, MD;  Location: [facility];  Service: Orthopedics;  Laterality: Left;    Social History   Social History  . Marital status: Single    Spouse name: N/A  . Number of children: 2  . Years of education: 13   Occupational History  . Retired    Social History Main Topics  . Smoking status: Current Some Day Smoker    Packs/day: 0.25    Years: 30.00    Types: Cigarettes    Last attempt  to quit: 01/31/2013  . Smokeless tobacco: Never Used     Comment: "some days I light one", requests a nicotine patch  . Alcohol use No  . Drug use: No  . Sexual activity: No   Other Topics Concern  . Not on file   Social History Narrative   Fun: Likes to do handyman related projects, photography.    Denies religious beliefs effecting health care.     Mobility: Independent with rolling walker Work history: Not obtained   No Known Allergies  Family History  Problem Relation Age of Onset  . Diabetes Mother   . Alzheimer's disease Mother   . Hypertension Mother   . Hyperlipidemia Father   . Hypertension Father   . Healthy Maternal Grandmother   . Pneumonia Maternal Grandfather      Prior to Admission medications   Medication Sig Start Date End Date Taking? Authorizing Provider  amLODipine (NORVASC) 10 MG tablet TAKE 1 TABLET ONCE DAILY. 07/20/17  Yes Bing Neighbors, [name]  aspirin 81 MG EC tablet Take 1 tablet (81 mg total) by mouth daily. 01/29/17  Yes Bing Neighbors, [name]  carvedilol (COREG) 6.25 MG tablet TAKE 1 TABLET TWICE DAILY WITH A MEAL. 07/15/17  Yes Bing Neighbors, [name]  feeding supplement, GLUCERNA SHAKE, (GLUCERNA SHAKE) LIQD Take 237 mLs by mouth 2 (two) times daily between meals. 06/30/17  Yes [name], [name], DO  FLUoxetine (PROZAC) 40 MG capsule Take 1 capsule (40 mg total) by mouth daily. 01/30/17  Yes Bing Neighbors, [name]  insulin aspart (NOVOLOG) 100 UNIT/ML FlexPen Inject 3 Units into the skin 3 (three) times daily with meals. For blood sugars greater than 150 only 06/30/17  Yes [name], [name], DO  Insulin Glargine (LANTUS SOLOSTAR) 100 UNIT/ML Solostar Pen Inject 10 Units into the skin daily at 10 pm. 06/30/17  Yes [name], [name], DO  Insulin Pen Needle 29G X MISC To attached to insulin pen and administer medication as prescribed. 06/30/17  Yes [name], [name], DO  Insulin Syringe-Needle U-100 (INSULIN SYRINGE .3CC/31GX5/16") 31G X  5/16" 0.3 ML MISC 5 Units by Does not apply route 2 (two) times daily. 06/30/17  Yes [name], [name], DO  acetaminophen (TYLENOL) 325 MG tablet Take 2 tablets (650 mg total) by mouth every 6 (six) hours as needed for mild pain (or Fever >/= 101). Patient not taking: Reported on 07/25/2017 01/20/17   Vassie Loll, MD  blood glucose meter kit and supplies Dispense based on patient and insurance preference. Use up to four times daily as directed. (FOR ICD-9 250.00, 250.01). Patient not taking: Reported on 07/25/2017 06/30/17   Marguerita Merles Latif, DO  glucagon (GLUCAGON EMERGENCY) 1 MG injection Inject 1 mg into the vein once as needed. 06/30/17   Marguerita Merles Latif, DO  vitamin B-12 (CYANOCOBALAMIN) 1000 MCG tablet Take 1 tablet (1,000 mcg total) by mouth daily. Patient not taking: Reported on 07/25/2017 06/30/17   Merlene Laughter, DO    Physical Exam: Vitals:   07/25/17  1245 07/25/17 0845 07/25/17 1203 07/25/17 1208  BP: 127/86 124/60  (!) 113/55  Pulse: 90 88  65  Resp: (!) 36 (!) 34  (!) 25  SpO2: 97% 100%  98%  Weight:   60.8 kg (134 lb)   Height:   '5\' 6"'$  (1.676 m)       Constitutional: Lethargic but in no acute distress, calm, comfortable Eyes: PERRL, lids and conjunctivae normal ENMT: Mucous membranes are very dry. Posterior pharynx clear of any exudate or lesions. Poor dentition.  Neck: normal, supple, no masses, no thyromegaly Respiratory: clear to auscultation bilaterally, no wheezing, no crackles. Normal respiratory effort. No accessory muscle use.  Cardiovascular: Regular rate and rhythm, no murmurs / rubs / gallops. No extremity edema. 2+ pedal pulses. No carotid bruits.  Abdomen: no tenderness, no masses palpated. No hepatosplenomegaly. Bowel sounds positive.  Musculoskeletal: no clubbing / cyanosis. No joint deformity upper and lower extremities. Good ROM, no contractures. Normal muscle tone.  Skin: no rashes, lesions, ulcers. No induration Neurologic: CN 2-12  grossly intact based on inspection. Sensation appears to be intact exam limited by patient's ability to participate, DTR equivocal. Unable to test strength secondary to inability to participate with exam Psychiatric: Lethargic    Labs on Admission: I have personally reviewed following labs and imaging studies  CBC:  Recent Labs Lab 07/25/17 1051  WBC 16.4*  NEUTROABS 12.8*  HGB 11.5*  HCT 37.3*  MCV 95.2  PLT 809   Basic Metabolic Panel:  Recent Labs Lab 07/25/17 1051  NA 124*  K 6.4*  CL 88*  CO2 <7*  GLUCOSE 854*  BUN 60*  CREATININE 3.41*  CALCIUM 9.0   GFR: Estimated Creatinine Clearance: 16.8 mL/min (A) (by C-G formula based on SCr of 3.41 mg/dL (H)). Liver Function Tests: No results for input(s): AST, ALT, ALKPHOS, BILITOT, PROT, ALBUMIN in the last 168 hours. No results for input(s): LIPASE, AMYLASE in the last 168 hours. No results for input(s): AMMONIA in the last 168 hours. Coagulation Profile: No results for input(s): INR, PROTIME in the last 168 hours. Cardiac Enzymes: No results for input(s): CKTOTAL, CKMB, CKMBINDEX, TROPONINI in the last 168 hours. BNP (last 3 results) No results for input(s): PROBNP in the last 8760 hours. HbA1C: No results for input(s): HGBA1C in the last 72 hours. CBG:  Recent Labs Lab 07/25/17 0812 07/25/17 1001  GLUCAP >600* >600*   Lipid Profile: No results for input(s): CHOL, HDL, LDLCALC, TRIG, CHOLHDL, LDLDIRECT in the last 72 hours. Thyroid Function Tests: No results for input(s): TSH, T4TOTAL, FREET4, T3FREE, THYROIDAB in the last 72 hours. Anemia Panel: No results for input(s): VITAMINB12, FOLATE, FERRITIN, TIBC, IRON, RETICCTPCT in the last 72 hours. Urine analysis:    Component Value Date/Time   COLORURINE YELLOW 06/27/2017 1750   APPEARANCEUR CLEAR 06/27/2017 1750   LABSPEC 1.012 06/27/2017 1750   PHURINE 6.0 06/27/2017 1750   GLUCOSEU 50 (A) 06/27/2017 1750   HGBUR NEGATIVE 06/27/2017 1750    BILIRUBINUR NEGATIVE 06/27/2017 1750   KETONESUR NEGATIVE 06/27/2017 1750   PROTEINUR 30 (A) 06/27/2017 1750   UROBILINOGEN 0.2 05/12/2017 0939   NITRITE NEGATIVE 06/27/2017 1750   LEUKOCYTESUR NEGATIVE 06/27/2017 1750   Sepsis Labs: '@LABRCNTIP'$ (procalcitonin:4,lacticidven:4) )No results found for this or any previous visit (from the past 240 hour(s)).   Radiological Exams on Admission: Dg Chest 2 View  Result Date: 07/25/2017 CLINICAL DATA:  Diabetic ketoacidosis. EXAM: CHEST  2 VIEW COMPARISON:  Chest x-ray dated June 27, 2017. FINDINGS: The cardiomediastinal  silhouette is normal in size. Normal pulmonary vascularity. No focal consolidation, pleural effusion, or pneumothorax. Unchanged compression deformities of T6 and T7. No acute osseous abnormality. IMPRESSION: No active cardiopulmonary disease. Electronically Signed   By: Titus Dubin M.D.   On: 07/25/2017 09:43    EKG: (Independently reviewed) Sinus rhythm with ventricular rate 67 bpm, QTC 430 ms, normal R-wave rotation, no acute ischemic changes, peaked T waves in the setting of acute hyperkalemia from dehydration and metabolic acidemia/DKA  Assessment/Plan Principal Problem:   DKA (diabetic ketoacidoses)  -Patient presents with acute DKA in setting of presumed inadequate dosing of home insulins (see previous admission/progress notes) -K+ 6.4--tx with aggressive hydration-repeat labs at 2 PM and if potassium remains elevated may need to initiate pharmacotherapy -Insulin infusion -Electrolytes every 4 hours until anion gap closed -Aggressive volume resuscitation with normal saline at 150/hr -Currently anuric-monitor output closely-may need additional fluid challenges -Diabetes educator consultation (see below) -HgbA1c last month 9.9 -?? Tradjenta w/ SSI better option  Active Problems:   Dementia -Patient lives alone and as documented in HPI he has had frequent visits to the ER 2/2 hypoglycemia in the setting of extra  insulin dosages or DKA secondary to noncompliance with Lantus -According to the daughter the patient appears to be inconsistent with checking CBGs as well -Daughter checks on patient frequently but is unable to live with him 24/7-she reports there are times that she knows he is not being truthful with her about his medical care i.e. when asked he'll report his CBG is 85 but she'll decide to drive over and check on him and when CBG actually checked his greater than 400 -Patient insistent on remaining as independent as possible and does not wish to be placed in nursing facility. Daughter concurs with this line of thought stating "we are also taking care of my grandmother who is 15 and does not want to go in a nursing home" -Previous PT/OT evaluations determined patient was not appropriate for skilled nursing level care -Daughter reports they have had nursing aide help in the home before but with most recent request this was denied by [facility] a few occasions the patient will not allow help to come into the home -I have discussed this extensively with the daughter and she will try to arrange for other family members to come and assist in the home. We are both concerned that patient likely needs 24/7 care and that if he wishes to live in his home another family member may need to move in with him -Case management consulted to assist -Social work consulted regarding assisting daughter with locating respite care -Diabetes educator consulted to determine if other medication administration options are available regarding patient's memory deficits that will limit medication errors    Acute kidney injury  -Secondary to profound dehydration from DKA with associated metabolic acidemia and hyperkalemia -Primary treatment is hydration and correction of hyperglycemia -Baseline renal function: 31/1.25 -Current renal function: 60/3.41    HTN (hypertension)/Left ventricular diastolic dysfunction -Current blood  pressure readings suboptimal setting of profound dehydration -Hold preadmission Norvasc and carvedilol    History of cerebrovascular accident (CVA) in adulthood -On baby aspirin at home -Not on statin    Depression with anxiety -On Prozac at home      DVT prophylaxis: Lovenox Code Status: Full Family Communication: daughter Disposition Plan: home Consults called: none     ELLIS,ALLISON L. ANP-BC [facility] Pager [phone]   If 7PM-7AM, please contact night-coverage [email or web address] Password [email or web address]  07/25/2017, 12:40  PM

## 2017-07-25 NOTE — ED Notes (Signed)
Pt resting Daughter -- Davy Pique on way back to hospital  [phone]

## 2017-07-25 NOTE — ED Notes (Signed)
Pt's IV infiltrated -- dr notified, PICC line ordered

## 2017-07-25 NOTE — ED Notes (Signed)
Lactic acid result given to Dr. Rex Kras

## 2017-07-25 NOTE — Progress Notes (Signed)
CRITICAL VALUE STICKER  CRITICAL VALUE: [place] (on-site recipient of call): Shirley Muscat RN  DATE & TIME NOTIFIED: 07/25/17 1714  MESSENGER (representative from lab):Merrilee Seashore  MD NOTIFIED:   TIME OF NOTIFICATION:1714  RESPONSE:

## 2017-07-25 NOTE — ED Notes (Signed)
IV therapy remains at bedside 

## 2017-07-25 NOTE — Progress Notes (Signed)
CRITICAL VALUE STICKER  CRITICAL VALUE: K+ 6.8, CO2 7,  RECEIVER (on-site recipient of call):TIM   DATE & TIME NOTIFIED: 07/25/17 1904  MESSENGER (representative from lab):  MD NOTIFIED: Paged 7:22 PM   TIME OF NOTIFICATION:  RESPONSE:
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
## 2017-07-26 DIAGNOSIS — E1110 Type 2 diabetes mellitus with ketoacidosis without coma: Secondary | ICD-10-CM

## 2017-07-26 LAB — BASIC METABOLIC PANEL
Anion gap: 10 (ref 5–15)
Anion gap: 12 (ref 5–15)
BUN: 64 mg/dL — ABNORMAL HIGH (ref 6–20)
BUN: 56 mg/dL — ABNORMAL HIGH (ref 6–20)
BUN: 54 mg/dL — ABNORMAL HIGH (ref 6–20)
CO2: <7 mmol/L — ABNORMAL LOW (ref 22–32)
CO2: 19 mmol/L — ABNORMAL LOW (ref 22–32)
CO2: 17 mmol/L — ABNORMAL LOW (ref 22–32)
Calcium: 7.8 mg/dL — ABNORMAL LOW (ref 8.9–10.3)
Calcium: 8 mg/dL — ABNORMAL LOW (ref 8.9–10.3)
Calcium: 8.3 mg/dL — ABNORMAL LOW (ref 8.9–10.3)
Chloride: 92 mmol/L — ABNORMAL LOW (ref 101–111)
Chloride: 106 mmol/L (ref 101–111)
Chloride: 106 mmol/L (ref 101–111)
Creatinine, Ser: 3.67 mg/dL — ABNORMAL HIGH (ref 0.61–1.24)
Creatinine, Ser: 2.82 mg/dL — ABNORMAL HIGH (ref 0.61–1.24)
Creatinine, Ser: 2.74 mg/dL — ABNORMAL HIGH (ref 0.61–1.24)
GFR calc Af Amer: 18 mL/min — ABNORMAL LOW (ref >60)
GFR calc Af Amer: 24 mL/min — ABNORMAL LOW (ref >60)
GFR calc Af Amer: 25 mL/min — ABNORMAL LOW (ref >60)
GFR calc non Af Amer: 15 mL/min — ABNORMAL LOW (ref >60)
GFR calc non Af Amer: 21 mL/min — ABNORMAL LOW (ref >60)
GFR calc non Af Amer: 22 mL/min — ABNORMAL LOW (ref >60)
Glucose, Bld: 691 mg/dL (ref 65–99)
Glucose, Bld: 103 mg/dL — ABNORMAL HIGH (ref 65–99)
Glucose, Bld: 191 mg/dL — ABNORMAL HIGH (ref 65–99)
Potassium: 6.8 mmol/L (ref 3.5–5.1)
Potassium: 4.3 mmol/L (ref 3.5–5.1)
Potassium: 4.4 mmol/L (ref 3.5–5.1)
Sodium: 122 mmol/L — ABNORMAL LOW (ref 135–145)
Sodium: 135 mmol/L (ref 135–145)
Sodium: 135 mmol/L (ref 135–145)

## 2017-07-26 LAB — GLUCOSE, CAPILLARY
Glucose-Capillary: 110 mg/dL — ABNORMAL HIGH (ref 65–99)
Glucose-Capillary: 135 mg/dL — ABNORMAL HIGH (ref 65–99)
Glucose-Capillary: 155 mg/dL — ABNORMAL HIGH (ref 65–99)
Glucose-Capillary: 170 mg/dL — ABNORMAL HIGH (ref 65–99)
Glucose-Capillary: 198 mg/dL — ABNORMAL HIGH (ref 65–99)
Glucose-Capillary: 202 mg/dL — ABNORMAL HIGH (ref 65–99)
Glucose-Capillary: 216 mg/dL — ABNORMAL HIGH (ref 65–99)
Glucose-Capillary: 535 mg/dL (ref 65–99)
Glucose-Capillary: 86 mg/dL (ref 65–99)
Glucose-Capillary: 90 mg/dL (ref 65–99)
Glucose-Capillary: 97 mg/dL (ref 65–99)

## 2017-07-26 LAB — URINALYSIS, ROUTINE W REFLEX MICROSCOPIC
Bilirubin Urine: NEGATIVE
Glucose, UA: >=500 mg/dL — AB
Ketones, ur: 20 mg/dL — AB
Leukocytes, UA: NEGATIVE
Nitrite: NEGATIVE
Protein, ur: 30 mg/dL — AB
Specific Gravity, Urine: 1.012 (ref 1.005–1.030)
pH: 5 (ref 5.0–8.0)

## 2017-07-26 LAB — MRSA PCR SCREENING: MRSA by PCR: NEGATIVE

## 2017-07-26 MED ORDER — INSULIN ASPART 100 UNIT/ML ~~LOC~~ SOLN
0.0000 [IU] | SUBCUTANEOUS | Status: DC
  Administered 2017-07-26: 8 [IU] via SUBCUTANEOUS
  Administered 2017-07-26 (×2): 4 [IU] via SUBCUTANEOUS
  Administered 2017-07-26: 2 [IU] via SUBCUTANEOUS
  Administered 2017-07-26: 24 [IU] via SUBCUTANEOUS
  Administered 2017-07-27: 16 [IU] via SUBCUTANEOUS
  Administered 2017-07-27: 2 [IU] via SUBCUTANEOUS

## 2017-07-26 MED ORDER — SODIUM CHLORIDE 0.9 % IV BOLUS (SEPSIS)
1000.0000 mL | Freq: Once | INTRAVENOUS | Status: AC
  Administered 2017-07-26: 1000 mL via INTRAVENOUS

## 2017-07-26 MED ORDER — INSULIN ASPART 100 UNIT/ML ~~LOC~~ SOLN: 0.0000 [IU] | SUBCUTANEOUS | Status: DC

## 2017-07-26 MED ORDER — INSULIN GLARGINE 100 UNIT/ML ~~LOC~~ SOLN: 10.0000 [IU] | Freq: Every day | SUBCUTANEOUS | Status: DC

## 2017-07-26 MED ORDER — INSULIN GLARGINE 100 UNIT/ML ~~LOC~~ SOLN
10.0000 [IU] | Freq: Every day | SUBCUTANEOUS | Status: DC
  Administered 2017-07-26 – 2017-07-28 (×3): 10 [IU] via SUBCUTANEOUS
  Filled 2017-07-26 (×3): qty 0.1

## 2017-07-26 MED ORDER — SODIUM BICARBONATE 650 MG PO TABS
650.0000 mg | ORAL_TABLET | Freq: Two times a day (BID) | ORAL | Status: DC
  Administered 2017-07-26 – 2017-07-28 (×4): 650 mg via ORAL
  Filled 2017-07-26 (×4): qty 1

## 2017-07-26 MED ORDER — SODIUM CHLORIDE 0.9 % IV SOLN
INTRAVENOUS | Status: DC
  Administered 2017-07-26 – 2017-07-28 (×3): via INTRAVENOUS

## 2017-07-26 NOTE — Progress Notes (Signed)
BS 535 24 units of sliding scale given paged md to notify no new orders given

## 2017-07-26 NOTE — Progress Notes (Signed)
PROGRESS NOTE    [name] [name]  DTO:[id number] DOB: May 17, 1946 DOA: 07/25/2017 PCP: [name], [name]    Brief Narrative: [name] [name] is a 71 y.o. male with medical history significant for diabetes mellitus on insulin, dementia, depression and anxiety, history of prior CVA. Patient has had at least 12 ED visits and/or admissions in 9833 for complications related to diabetes: DKA or hypoglycemia after issues with proper medication administration. He was just discharged on 9/18 after an episode of hypoglycemia after inadvertently dosing himself twice with insulin. His Lantus dosage at that time was decreased to 10 units per night with sliding scale insulin ordered as meal coverage but only if he eats at least 50% of his meals. It has been previously documented in the past that patient has also forgotten to eat during the day.   He was brought to the ER today by his daughter due to altered mentation and CBG reading "high" on glucometer. A dose of NovoLog insulin reportedly was given prior to arrival. In triage patient was noted to be tachypneic and dehydrated but normotensive and not hypoxic. His serum glucose was markedly elevated at 854, and profound hyponatremia of 124 with chloride 88 and CO2 less than 7, anion gap unable to be calculated. He also had a significant acute kidney injury with a BUN of 60 /creatinine of 3.41. Chest x-ray was unremarkable. Urinalysis is pending but due to degree of dehydration patient has yet to void. He has been given 2 L of fluid in the ER as well as orders have been placed to initiate an insulin infusion for his DKA.    Assessment & Plan:   Principal Problem:   DKA (diabetic ketoacidoses) ([place]) Active Problems:   History of cerebrovascular accident (CVA) in adulthood   HTN (hypertension)   Depression with anxiety   Left ventricular diastolic dysfunction   Dementia   Acute kidney injury ([place])   1-DKA; He was on insulin gtt. Now on lantus, SSI. Gap  close.  IV fluids.   2-AKI; in setting of DKA.  Continue with IV fluids.  Repeat labs in am.   3-Hyperkalemia; resolved.   4- HTN (hypertension)/Left ventricular diastolic dysfunction -Current blood pressure readings suboptimal setting of profound dehydration -Hold preadmission Norvasc and carvedilol    History of cerebrovascular accident (CVA) in adulthood -On baby aspirin at home -Not on statin    Depression with anxiety -On Prozac at home   Dementia; need safe discharge home     DVT prophylaxis:  Code Status: Full code.  Family Communication: care discussed with patient  Disposition Plan: need safe discharge plan to prevents readmission.  Consultants:   none   Procedures: none   Antimicrobials: none   Subjective: Patient doing ok, denies chest pain, abdominal pain.  He decline SNF    Objective: Vitals:   07/26/17 0338 07/26/17 0400 07/26/17 0530 07/26/17 0600  BP: 102/63 (!) 103/51 107/62 (!) 116/57  Pulse: (!) 55 (!) 54  (!) 57  Resp: 13 20  16   Temp: 98.5 F (36.9 C)     TempSrc: Oral     SpO2: 94% 96%  100%  Weight:      Height:        Intake/Output Summary (Last 24 hours) at 07/26/17 0939 Last data filed at 07/26/17 0600  Gross per 24 hour  Intake          3575.34 ml  Output  300 ml  Net          3275.34 ml   Filed Weights   07/25/17 1203  Weight: 60.8 kg (134 lb)    Examination:  General exam: Appears calm and comfortable  Respiratory system: Clear to auscultation. Respiratory effort normal. Cardiovascular system: S1 & S2 heard, RRR. No JVD, murmurs, rubs, gallops or clicks. No pedal edema. Gastrointestinal system: Abdomen is nondistended, soft and nontender. No organomegaly or masses felt. Normal bowel sounds heard. Central nervous system: Alert and oriented. No focal neurological deficits. Extremities: Symmetric 5 x 5 power. Skin: No rashes, lesions or ulcers     Data Reviewed: I have personally reviewed  following labs and imaging studies  CBC:  Recent Labs Lab 07/25/17 1051  WBC 16.4*  NEUTROABS 12.8*  HGB 11.5*  HCT 37.3*  MCV 95.2  PLT 811   Basic Metabolic Panel:  Recent Labs Lab 07/25/17 1536 07/25/17 1706 07/25/17 2225 07/26/17 0350 07/26/17 0701  NA 127* 122* 132* 135 135  K 6.6* 6.8* 4.5 4.3 4.4  CL 94* 92* 103 106 106  CO2 9* <7* 15* 19* 17*  GLUCOSE 605* 691*  704* 222* 103* 191*  BUN 60* 64* 61* 56* 54*  CREATININE 3.40* 3.67* 3.32* 2.82* 2.74*  CALCIUM 8.3* 7.8* 8.3* 8.0* 8.3*   GFR: Estimated Creatinine Clearance: 21 mL/min (A) (by C-G formula based on SCr of 2.74 mg/dL (H)). Liver Function Tests: No results for input(s): AST, ALT, ALKPHOS, BILITOT, PROT, ALBUMIN in the last 168 hours. No results for input(s): LIPASE, AMYLASE in the last 168 hours. No results for input(s): AMMONIA in the last 168 hours. Coagulation Profile: No results for input(s): INR, PROTIME in the last 168 hours. Cardiac Enzymes: No results for input(s): CKTOTAL, CKMB, CKMBINDEX, TROPONINI in the last 168 hours. BNP (last 3 results) No results for input(s): PROBNP in the last 8760 hours. HbA1C: No results for input(s): HGBA1C in the last 72 hours. CBG:  Recent Labs Lab 07/26/17 0336 07/26/17 0439 07/26/17 0540 07/26/17 0643 07/26/17 0901  GLUCAP 97 155* 202* 216* 170*   Lipid Profile: No results for input(s): CHOL, HDL, LDLCALC, TRIG, CHOLHDL, LDLDIRECT in the last 72 hours. Thyroid Function Tests: No results for input(s): TSH, T4TOTAL, FREET4, T3FREE, THYROIDAB in the last 72 hours. Anemia Panel: No results for input(s): VITAMINB12, FOLATE, FERRITIN, TIBC, IRON, RETICCTPCT in the last 72 hours. Sepsis Labs:  Recent Labs Lab 07/25/17 1101  LATICACIDVEN 2.85*    Recent Results (from the past 240 hour(s))  MRSA PCR Screening     Status: None   Collection Time: 07/25/17 11:12 PM  Result Value Ref Range Status   MRSA by PCR NEGATIVE NEGATIVE Final    Comment:         The GeneXpert MRSA Assay (FDA approved for NASAL specimens only), is one component of a comprehensive MRSA colonization surveillance program. It is not intended to diagnose MRSA infection nor to guide or monitor treatment for MRSA infections.          Radiology Studies: Dg Chest 2 View  Result Date: 07/25/2017 CLINICAL DATA:  Diabetic ketoacidosis. EXAM: CHEST  2 VIEW COMPARISON:  Chest x-ray dated June 27, 2017. FINDINGS: The cardiomediastinal silhouette is normal in size. Normal pulmonary vascularity. No focal consolidation, pleural effusion, or pneumothorax. Unchanged compression deformities of T6 and T7. No acute osseous abnormality. IMPRESSION: No active cardiopulmonary disease. Electronically Signed   By: Titus Dubin M.D.   On: 07/25/2017 09:43  Scheduled Meds: . enoxaparin (LOVENOX) injection  30 mg Subcutaneous Q24H  . insulin aspart  0-24 Units Subcutaneous Q4H  . insulin glargine  10 Units Subcutaneous Daily  . sodium chloride flush  10-40 mL Intracatheter Q12H   Continuous Infusions: . sodium chloride 100 mL/hr at 07/26/17 0815     LOS: 1 day    Time spent: 35 minutes.     Elmarie Shiley, MD [facility] Pager [phone]  If 7PM-7AM, please contact night-coverage [email or web address] Password [email or web address] 07/26/2017, 9:39 AM

## 2017-07-26 NOTE — Progress Notes (Signed)
Inpatient Diabetes Program Recommendations  AACE/ADA: New Consensus Statement on Inpatient Glycemic Control (2015)  Target Ranges:  Prepandial:   less than 140 mg/dL      Peak postprandial:   less than 180 mg/dL (1-2 hours)      Critically ill patients:  140 - 180 mg/dL   Lab Results  Component Value Date   GLUCAP 198 (H) 07/26/2017   HGBA1C 9.9 (H) 06/27/2017    Review of Glycemic Control  Diabetes history: DM2 Outpatient Diabetes medications: Lantus 10 units QHS, Novolog 3 units tidwc (daughter gave him 15 units yesterday 07/25/2017) Current orders for Inpatient glycemic control: Lantus 10 units QD, Novolog 0-24 units Q4H  Inpatient Diabetes Program Recommendations:    Change Novolog to 0-9 units tidwc   Per DM Coordinator's note on 06/30/2017:  Spoke with patient at bedside yesterday (daughter not present). Patient told me that the [facility] has not been coming to his house. I read the note from the PT team and it looks as if pt refuses HHRN from time to time. Could not recall how much insulin he is supposed to be taking and could not recall which insulin he accidentally took too much of at home prior to admission.I am concerned that this patient cannot safely dose his insulin (states he has a paper on his refrigerator with instructions at home). States he gives himself all injections and that his daughter does not need to help him. Patient likely would benefit from closer supervision of insulin doses at home by either the [facility] or his family.  Pt is unable to safely give insulin at home. Will follow.   Thank you. Lorenda Peck, RD, LDN, CDE Inpatient Diabetes Coordinator [phone]

## 2017-07-27 DIAGNOSIS — N179 Acute kidney failure, unspecified: Secondary | ICD-10-CM

## 2017-07-27 LAB — CBC
HCT: 25.9 % — ABNORMAL LOW (ref 39.0–52.0)
Hemoglobin: 8.9 g/dL — ABNORMAL LOW (ref 13.0–17.0)
MCH: 30.2 pg (ref 26.0–34.0)
MCHC: 34.4 g/dL (ref 30.0–36.0)
MCV: 87.8 fL (ref 78.0–100.0)
Platelets: 164 10*3/uL (ref 150–400)
RBC: 2.95 MIL/uL — ABNORMAL LOW (ref 4.22–5.81)
RDW: 14 % (ref 11.5–15.5)
WBC: 7.3 10*3/uL (ref 4.0–10.5)

## 2017-07-27 LAB — BASIC METABOLIC PANEL
Anion gap: 8 (ref 5–15)
BUN: 42 mg/dL — ABNORMAL HIGH (ref 6–20)
CO2: 21 mmol/L — ABNORMAL LOW (ref 22–32)
Calcium: 8.2 mg/dL — ABNORMAL LOW (ref 8.9–10.3)
Chloride: 108 mmol/L (ref 101–111)
Creatinine, Ser: 1.65 mg/dL — ABNORMAL HIGH (ref 0.61–1.24)
GFR calc Af Amer: 46 mL/min — ABNORMAL LOW (ref >60)
GFR calc non Af Amer: 40 mL/min — ABNORMAL LOW (ref >60)
Glucose, Bld: 112 mg/dL — ABNORMAL HIGH (ref 65–99)
Potassium: 3.8 mmol/L (ref 3.5–5.1)
Sodium: 137 mmol/L (ref 135–145)

## 2017-07-27 LAB — GLUCOSE, CAPILLARY
Glucose-Capillary: 100 mg/dL — ABNORMAL HIGH (ref 65–99)
Glucose-Capillary: 108 mg/dL — ABNORMAL HIGH (ref 65–99)
Glucose-Capillary: 121 mg/dL — ABNORMAL HIGH (ref 65–99)
Glucose-Capillary: 160 mg/dL — ABNORMAL HIGH (ref 65–99)
Glucose-Capillary: 183 mg/dL — ABNORMAL HIGH (ref 65–99)
Glucose-Capillary: 231 mg/dL — ABNORMAL HIGH (ref 65–99)
Glucose-Capillary: 325 mg/dL — ABNORMAL HIGH (ref 65–99)

## 2017-07-27 MED ORDER — ENOXAPARIN SODIUM 40 MG/0.4ML ~~LOC~~ SOLN
40.0000 mg | SUBCUTANEOUS | Status: DC
  Administered 2017-07-27: 40 mg via SUBCUTANEOUS
  Filled 2017-07-27: qty 0.4

## 2017-07-27 MED ORDER — AMLODIPINE BESYLATE 10 MG PO TABS
10.0000 mg | ORAL_TABLET | Freq: Every day | ORAL | Status: DC
  Administered 2017-07-27 – 2017-07-28 (×2): 10 mg via ORAL
  Filled 2017-07-27 (×2): qty 1

## 2017-07-27 MED ORDER — INSULIN ASPART 100 UNIT/ML ~~LOC~~ SOLN
0.0000 [IU] | Freq: Three times a day (TID) | SUBCUTANEOUS | Status: DC
  Administered 2017-07-27: 3 [IU] via SUBCUTANEOUS
  Administered 2017-07-27 – 2017-07-28 (×2): 2 [IU] via SUBCUTANEOUS
  Administered 2017-07-28: 3 [IU] via SUBCUTANEOUS

## 2017-07-27 MED ORDER — ASPIRIN EC 81 MG PO TBEC
81.0000 mg | DELAYED_RELEASE_TABLET | Freq: Every day | ORAL | Status: DC
  Administered 2017-07-27 – 2017-07-28 (×2): 81 mg via ORAL
  Filled 2017-07-27 (×2): qty 1

## 2017-07-27 MED ORDER — FLUOXETINE HCL 20 MG PO CAPS
40.0000 mg | ORAL_CAPSULE | Freq: Every day | ORAL | Status: DC
  Administered 2017-07-27 – 2017-07-28 (×2): 40 mg via ORAL
  Filled 2017-07-27 (×3): qty 2

## 2017-07-27 NOTE — Care Management Note (Addendum)
Case Management Note Marvetta Gibbons RN, BSN Unit 4E-Case Manager [phone]  Patient Details  Name: [name] [name] MRN: [id number] Date of Birth: 12/28/1945  Subjective/Objective:  Pt admitted with DKA                Action/Plan: PTA pt lived at home alone, was active with [facility] for [facility] services- RN/PT/OT/aide- under [place] program.- daughter works but check on pt during day. PT/OT evals pending on this admission- if pt returns home will need resumption of HH services- [facility] will continue to follow under [place] for enhanced [place] services.   Expected Discharge Date:                  Expected Discharge Plan:  [place]  In-House Referral:  Clinical Social Work  Discharge planning Services  CM Consult  Post Acute Care Choice:  Home Health, Resumption of Svcs/PTA Provider Choice offered to:  Adult Children, Patient  DME Arranged:    DME Agency:     HH Arranged:  RN, PT, OT, Nurse's Aide [place] Agency:  [place]  Status of Service:  In process, will continue to follow  If discussed at Long Length of Stay Meetings, dates discussed:    Discharge Disposition:   Additional Comments:  Dawayne Patricia, RN 07/27/2017, 11:00 AM

## 2017-07-27 NOTE — Care Management Note (Deleted)
Case Management Note Marvetta Gibbons RN, BSN Unit 4E-Case Manager [phone]  Patient Details  Name: Rik Wadel MRN: [id number] Date of Birth: Nov 01, 1945  Subjective/Objective:    Pt admitted with DKA               Action/Plan: PTA pt lived at home alone, was active with [facility] for [facility] services- RN/PT/OT/aide under [place]- daughter works during day. PT/OT evals pending on this admission- if pt returns home will need to resume [place] will continue under [place] program.   Expected Discharge Date:                 Expected Discharge Plan:  [place]  In-House Referral:  NA  Discharge planning Services     Post Acute Care Choice:  Resumption of Svcs/PTA Provider, Home Health Choice offered to:  Adult Children  DME Arranged:  N/A DME Agency:     HH Arranged:  RN, OT, PT, Nurse's Aide [place] Agency:  [place] [facility]  Status of Service:  Completed, signed off  If discussed at [place] of Stay Meetings, dates discussed:    Discharge Disposition:   Additional Comments:  [place], [place], RN 07/27/2017, 10:55 AM

## 2017-07-27 NOTE — Progress Notes (Signed)
PROGRESS NOTE    [name] [name]  NLG:[id number] DOB: 1946/02/13 DOA: 07/25/2017 PCP: [name], [name]    Brief Narrative: [name] [name] is a 71 y.o. male with medical history significant for diabetes mellitus on insulin, dementia, depression and anxiety, history of prior CVA. Patient has had at least 12 ED visits and/or admissions in 0814 for complications related to diabetes: DKA or hypoglycemia after issues with proper medication administration. He was just discharged on 9/18 after an episode of hypoglycemia after inadvertently dosing himself twice with insulin. His Lantus dosage at that time was decreased to 10 units per night with sliding scale insulin ordered as meal coverage but only if he eats at least 50% of his meals. It has been previously documented in the past that patient has also forgotten to eat during the day.   He was brought to the ER today by his daughter due to altered mentation and CBG reading "high" on glucometer. A dose of NovoLog insulin reportedly was given prior to arrival. In triage patient was noted to be tachypneic and dehydrated but normotensive and not hypoxic. His serum glucose was markedly elevated at 854, and profound hyponatremia of 124 with chloride 88 and CO2 less than 7, anion gap unable to be calculated. He also had a significant acute kidney injury with a BUN of 60 /creatinine of 3.41. Chest x-ray was unremarkable. Urinalysis is pending but due to degree of dehydration patient has yet to void. He has been given 2 L of fluid in the ER as well as orders have been placed to initiate an insulin infusion for his DKA.    Assessment & Plan:   Principal Problem:   DKA (diabetic ketoacidoses) ([place]) Active Problems:   History of cerebrovascular accident (CVA) in adulthood   HTN (hypertension)   Depression with anxiety   Left ventricular diastolic dysfunction   Dementia   Acute kidney injury ([place])   1-DKA; Resolved He was on insulin gtt. Now on lantus,  SSI. Gap close.  IV fluids.  Continue with lantus and SSI.  Need nurse follow up at home.   2-AKI; in setting of DKA.  Continue with IV fluids.  Improving with IV fluids.  Cr peak to 3.6, it has decreased to 1.7.  Metabolic acidosis, improved with sodium bicarb and correction of renal failure.   3-Hyperkalemia; resolved.   4- HTN (hypertension)/Left ventricular diastolic dysfunction -Current blood pressure readings suboptimal setting of profound dehydration -Hold preadmission  Carvedilol due to bradycardia.  Resume norvasc,     History of cerebrovascular accident (CVA) in adulthood -On baby aspirin at home, resume.  -Not on statin    Depression with anxiety -On Prozac at home   Dementia; need safe discharge home     DVT prophylaxis:  Code Status: Full code.  Family Communication: care discussed with patient  Disposition Plan: need safe discharge plan to prevents readmission.  Consultants:   none   Procedures: none   Antimicrobials: none   Subjective: Denies chest pain, dyspnea.  He decline SNF    Objective: Vitals:   07/27/17 0017 07/27/17 0341 07/27/17 0745 07/27/17 1300  BP: 129/77 122/76 (!) 164/74   Pulse: 62 60 62   Resp: 14 19 18    Temp: 98.7 F (37.1 C) 98.3 F (36.8 C) 98.1 F (36.7 C) 98.5 F (36.9 C)  TempSrc: Oral Oral Oral Oral  SpO2: 99% 98% 100%   Weight:      Height:        Intake/Output Summary (  Last 24 hours) at 07/27/17 1419 Last data filed at 07/27/17 0900  Gross per 24 hour  Intake             2627 ml  Output             1850 ml  Net              777 ml   Filed Weights   07/25/17 1203  Weight: 60.8 kg (134 lb)    Examination:  General exam: NAD Respiratory system: CTA Cardiovascular system: S 1, S 2 RRR Gastrointestinal system: Bs present, soft, nt Central nervous system: alert, non focal.  Extremities: symmetric power.  Skin: No rashes, lesions or ulcers     Data Reviewed: I have personally reviewed  following labs and imaging studies  CBC:  Recent Labs Lab 07/25/17 1051 07/27/17 0432  WBC 16.4* 7.3  NEUTROABS 12.8*  --   HGB 11.5* 8.9*  HCT 37.3* 25.9*  MCV 95.2 87.8  PLT 211 094   Basic Metabolic Panel:  Recent Labs Lab 07/25/17 1706 07/25/17 2225 07/26/17 0350 07/26/17 0701 07/27/17 0432  NA 122* 132* 135 135 137  K 6.8* 4.5 4.3 4.4 3.8  CL 92* 103 106 106 108  CO2 <7* 15* 19* 17* 21*  GLUCOSE 691*  704* 222* 103* 191* 112*  BUN 64* 61* 56* 54* 42*  CREATININE 3.67* 3.32* 2.82* 2.74* 1.65*  CALCIUM 7.8* 8.3* 8.0* 8.3* 8.2*   GFR: Estimated Creatinine Clearance: 34.8 mL/min (A) (by C-G formula based on SCr of 1.65 mg/dL (H)). Liver Function Tests: No results for input(s): AST, ALT, ALKPHOS, BILITOT, PROT, ALBUMIN in the last 168 hours. No results for input(s): LIPASE, AMYLASE in the last 168 hours. No results for input(s): AMMONIA in the last 168 hours. Coagulation Profile: No results for input(s): INR, PROTIME in the last 168 hours. Cardiac Enzymes: No results for input(s): CKTOTAL, CKMB, CKMBINDEX, TROPONINI in the last 168 hours. BNP (last 3 results) No results for input(s): PROBNP in the last 8760 hours. HbA1C: No results for input(s): HGBA1C in the last 72 hours. CBG:  Recent Labs Lab 07/26/17 2123 07/27/17 0016 07/27/17 0443 07/27/17 0742 07/27/17 1158  GLUCAP 535* 325* 108* 121* 183*   Lipid Profile: No results for input(s): CHOL, HDL, LDLCALC, TRIG, CHOLHDL, LDLDIRECT in the last 72 hours. Thyroid Function Tests: No results for input(s): TSH, T4TOTAL, FREET4, T3FREE, THYROIDAB in the last 72 hours. Anemia Panel: No results for input(s): VITAMINB12, FOLATE, FERRITIN, TIBC, IRON, RETICCTPCT in the last 72 hours. Sepsis Labs:  Recent Labs Lab 07/25/17 1101  LATICACIDVEN 2.85*    Recent Results (from the past 240 hour(s))  MRSA PCR Screening     Status: None   Collection Time: 07/25/17 11:12 PM  Result Value Ref Range Status    MRSA by PCR NEGATIVE NEGATIVE Final    Comment:        The GeneXpert MRSA Assay (FDA approved for NASAL specimens only), is one component of a comprehensive MRSA colonization surveillance program. It is not intended to diagnose MRSA infection nor to guide or monitor treatment for MRSA infections.          Radiology Studies: No results found.      Scheduled Meds: . enoxaparin (LOVENOX) injection  30 mg Subcutaneous Q24H  . insulin aspart  0-9 Units Subcutaneous TID WC  . insulin glargine  10 Units Subcutaneous Daily  . sodium bicarbonate  650 mg Oral BID  . sodium chloride flush  10-40 mL Intracatheter Q12H   Continuous Infusions: . sodium chloride 100 mL/hr at 07/27/17 0447     LOS: 2 days    Time spent: 35 minutes.     Elmarie Shiley, MD [facility] Pager [phone]  If 7PM-7AM, please contact night-coverage [email or web address] Password [email or web address] 07/27/2017, 2:19 PM

## 2017-07-27 NOTE — Clinical Social Work Note (Signed)
Clinical Social Work Assessment  Patient Details  Name: [name] [name] MRN: [id number] Date of Birth: 02-23-1946  Date of referral:  07/27/17               Reason for consult:  Facility Placement, Discharge Planning                Permission sought to share information with:  Family Supports Permission granted to share information::  Yes, Verbal Permission Granted  Name::     Jawann Urbani  Agency::     Relationship::  daughter  Contact Information:  [phone]  Housing/Transportation Living arrangements for the past 2 months:  [place] of Information:  Patient Patient Interpreter Needed:  None Criminal Activity/Legal Involvement Pertinent to Current Situation/Hospitalization:  No - Comment as needed Significant Relationships:  Siblings, Adult Children Lives with:  Self Do you feel safe going back to the place where you live?  Yes Need for family participation in patient care:  Yes (Comment)  Care giving concerns: Patient from home alone. Daughter lives in the area and helps patient at home.   Social Worker assessment / plan: CSW met with patient at bedside. Patient alert and oriented. CSW discussed PT/OT recommendations. Patient adamantly prefers to go home. Patient does not want home health, but reports he's had [facility] services in the past; indicates he will choose that as opposed to SNF. Patient gave verbal permission for CSW to talk to his daughter, Karna Christmas. CSW notes patient has a legal guardian, as indicated in chart. CSW called patient's daughter and left voicemail requesting call back to discuss disposition planning. CSW to follow and confirm who is legal guardian and support with disposition planning.  Employment status:  Retired Forensic scientist:  [employer] PT Recommendations:  [place], Home with [place] / Referral to community resources:  [place]  Patient/Family's Response to care: Patient refusing SNF, but note  patient has legal guardian.  Patient/Family's Understanding of and Emotional Response to Diagnosis, Current Treatment, and Prognosis: Patient prefers to go home, has poor understanding of conditions and how to safely manage them.   Emotional Assessment Appearance:  Appears stated age Attitude/Demeanor/Rapport:  Other (appropriate) Affect (typically observed):  Calm Orientation:  Oriented to Self, Oriented to Place, Oriented to  Time, Oriented to Situation Alcohol / Substance use:  Not Applicable Psych involvement (Current and /or in the community):  No (Comment)  Discharge Needs  Concerns to be addressed:  Discharge Planning Concerns Readmission within the last 30 days:  Yes Current discharge risk:  Physical Impairment Barriers to Discharge:  Continued Medical Work up   Estanislado Emms, LCSW 07/27/2017, 2:38 PM

## 2017-07-27 NOTE — Evaluation (Signed)
Occupational Therapy Evaluation Patient Details Name: [name] [name] MRN: [id number] DOB: 04-Jan-1946 Today's Date: 07/27/2017    History of Present Illness (P) Pt is a 71 y.o. male admitted with DKA as well as AKI. Pt with PMH signficinat for diabetes mellitus on insulin, dementia, depression, anxiety, and history of CVA. Pt with multiple ED visits and admissions related to diabetes this year.    Clinical Impression   PTA, pt was living alone and reports independence with basic ADL and functional mobility. He currently presents with decreased awareness, attention, and memory as well as generalized weakness and decreased stability on his feet impacting his safety and independence with ADL participation. Pt currently requires min assist for toilet transfers and LB ADL. Due to current deficits, feel that pt is not safe to be home alone and would best benefit from SNF level rehabilitation. However, pt reports strong preference to return home. OT will continue to follow while admitted.    Follow Up Recommendations  SNF;Supervision/Assistance - 24 hour (Will need maximum HH services if refuses SNF)    Equipment Recommendations  3 in 1 bedside commode    Recommendations for Other Services       Precautions / Restrictions Precautions Precautions:  Fall Restrictions Weight Bearing Restrictions:  No      Mobility Bed Mobility Overal bed mobility: Needs Assistance Bed Mobility: Supine to Sit;Sit to Supine     Supine to sit: Supervision Sit to supine: Supervision   General bed mobility comments: Supervision for safety.   Transfers Overall transfer level:  Needs assistance Equipment used:  None Transfers:  Sit to/from [facility] Sit to Stand:  Min assist Stand pivot transfers:  Min assist       General transfer comment:  Min assist for safety and stability. Pt impulsive.     Balance                                           ADL either performed  or assessed with clinical judgement   ADL Overall ADL's : Needs assistance/impaired Eating/Feeding: Supervision/ safety;Sitting   Grooming: Supervision/safety;Sitting   Upper Body Bathing: Sitting;Supervision/ safety   Lower Body Bathing: Minimal assistance;Sit to/from stand   Upper Body Dressing : Supervision/safety;Sitting   Lower Body Dressing: Minimal assistance;Sit to/from stand   Toilet Transfer: Minimal Production assistant, radio Details (indicate cue type and reason): simulated from bed to chair for cognitive assessment; pt declined to sit in chair after test and completed second transfer back to bed.  Toileting- Clothing Manipulation and Hygiene: Minimal assistance;Sit to/from stand       Functional mobility during ADLs: Minimal assistance General ADL Comments: Pt impulsive and unsteady this session.      Vision Patient Visual Report: No change from baseline Vision Assessment?: No apparent visual deficits     Perception     Praxis      Pertinent Vitals/Pain Pain Assessment: No/denies pain     Hand Dominance     Extremity/Trunk Assessment Upper Extremity Assessment Upper Extremity Assessment: Generalized weakness   Lower Extremity Assessment Lower Extremity Assessment: Generalized weakness       Communication Communication Communication: (P) No difficulties   Cognition Arousal/Alertness: (P) Awake/alert Behavior During Therapy: (P) WFL for tasks assessed/performed Overall Cognitive Status: (P) History of cognitive impairments - at baseline Area of Impairment: (P) Memory;Attention;Awareness;Safety/judgement  Current Attention Level: Selective Memory: Decreased short-term memory   Safety/Judgement: Decreased awareness of safety;Decreased awareness of deficits Awareness: Intellectual   General Comments: (P) Pt scored 17/30 on MOCA 7.1 with noted deficits in memory and attention. Pt additionally with poor  executive functioning. Unable to understand the connection between his memory deficits and his lack of safety with medication management.    General Comments       Exercises     Shoulder Instructions      Home Living Family/patient expects to be discharged to:: (P) Private residence Living Arrangements: (P) Alone Available Help at Discharge: (P) Family;Available PRN/intermittently Type of Home: (P) Apartment Home Access: (P) Elevator     Home Layout: (P) One level     Bathroom Shower/Tub: (P) Walk-in shower   Bathroom Toilet: (P) Standard                Prior Functioning/Environment Level of Independence: (P) Independent        Comments: (P) Daughter checks in on pt daily but cannot stay 24 hours.         OT Problem List: Decreased strength;Decreased range of motion;Decreased activity tolerance;Impaired balance (sitting and/or standing);Decreased cognition;Decreased safety awareness;Decreased knowledge of use of DME or AE;Decreased knowledge of precautions      OT Treatment/Interventions: Self-care/ADL training;Therapeutic exercise;Energy conservation;DME and/or AE instruction;Therapeutic activities;Patient/family education;Balance training    OT Goals(Current goals can be found in the care plan section) Acute Rehab OT Goals Patient Stated Goal: (P) to go home not SNF OT Goal Formulation: With patient Time For Goal Achievement: 08/10/17 Potential to Achieve Goals: Good ADL Goals Pt Will Perform Grooming: with supervision;standing Pt Will Transfer to Toilet: with supervision;ambulating;regular height toilet Pt Will Perform Toileting - Clothing Manipulation and hygiene: with supervision;sit to/from stand Additional ADL Goal #1: Pt will incorporate 2 external memory aids into medication management routine with no more than 1 VC.  Additional ADL Goal #2: Pt will demonstrate alternating attention during standing grooming tasks in a moderately distracting environment.    OT Frequency: Min 2X/week   Barriers to D/C:            Co-evaluation              AM-PAC PT "6 Clicks" Daily Activity     Outcome Measure Help from another person eating meals?: A Little Help from another person taking care of personal grooming?: A Little Help from another person toileting, which includes using toliet, bedpan, or urinal?: A Little Help from another person bathing (including washing, rinsing, drying)?: A Little Help from another person to put on and taking off regular upper body clothing?: A Little Help from another person to put on and taking off regular lower body clothing?: A Little 6 Click Score: 18   End of Session    Activity Tolerance: Patient tolerated treatment well Patient left: in bed;with call bell/phone within reach  OT Visit Diagnosis: Unsteadiness on feet (R26.81);Other symptoms and signs involving cognitive function                Time: 1601-0932 OT Time Calculation (min): 31 min Charges:  OT General Charges $OT Visit: 1 Visit OT Evaluation $OT Eval Moderate Complexity: 1 Mod OT Treatments $Therapeutic Activity: 8-22 mins G-Codes:     [name] Herrlich, [name] [name]  Pager: [name] [name] 07/27/2017, 12:57 PM

## 2017-07-27 NOTE — Evaluation (Signed)
Physical Therapy Evaluation Patient Details Name: [name] [name] MRN: [id number] DOB: [birth year] Today's Date: 07/27/2017   History of Present Illness  Pt is a 71 y.o. male admitted with DKA as well as AKI. Pt with PMH signficinat for diabetes mellitus on insulin, dementia, depression, anxiety, and history of CVA. Pt with multiple ED visits and admissions related to diabetes this year.   Clinical Impression  Pt admitted with above diagnosis. Pt currently with functional limitations due to the deficits listed below (see PT Problem List). Pt is limited by generalized weakness and decreased awareness, and memory. Pt is currently supervision for bed mobility, and min guard for transfers and ambulation of 125 with RW. Pt educated on need to allow for help with maintaining his blood sugar in order to stay in his home environment as it is his strong desire to stay home. Pt will benefit from skilled PT to increase their independence and safety with mobility to allow discharge to the venue listed below.       Follow Up Recommendations Home health PT;Supervision - Intermittent    Equipment Recommendations  None recommended by PT    Recommendations for Other Services       Precautions / Restrictions Precautions Precautions: Fall Restrictions Weight Bearing Restrictions: No      Mobility  Bed Mobility Overal bed mobility: Needs Assistance Bed Mobility: Supine to Sit;Sit to Supine     Supine to sit: Supervision Sit to supine: Supervision   General bed mobility comments: Supervision for safety.   Transfers Overall transfer level: Needs assistance Equipment used: None Transfers: Sit to/from [facility] Sit to Stand: Min guard Stand pivot transfers: Min assist       General transfer comment: min guard for safety, good power up and steadying with RW  Ambulation/Gait Ambulation/Gait assistance: Min guard Ambulation Distance (Feet): 125 Feet Assistive device: Rolling  walker (2 wheeled) Gait Pattern/deviations: Step-through pattern;Decreased step length - right;Decreased step length - left;Shuffle Gait velocity: slowed Gait velocity interpretation: Below normal speed for age/gender General Gait Details: min guard for safety, slow steady cadence, no LoB, no buckling, vc for upright posture and looking up and out with gait      Balance Overall balance assessment: Needs assistance Sitting-balance support: Feet supported;No upper extremity supported Sitting balance-Leahy Scale: Good     Standing balance support: Bilateral upper extremity supported Standing balance-Leahy Scale: Fair Standing balance comment: utilized RW for UE support                             Pertinent Vitals/Pain Pain Assessment: No/denies pain    Home Living Family/patient expects to be discharged to:: Private residence Living Arrangements: Alone Available Help at Discharge: Family;Available PRN/intermittently Type of Home: Apartment Home Access: Elevator     Home Layout: One level        Prior Function Level of Independence: Independent         Comments: Daughter checks in on pt daily but cannot stay 24 hours.      Hand Dominance        Extremity/Trunk Assessment   Upper Extremity Assessment Upper Extremity Assessment: Defer to OT evaluation    Lower Extremity Assessment Lower Extremity Assessment: Generalized weakness       Communication   Communication: No difficulties  Cognition Arousal/Alertness: Awake/alert Behavior During Therapy: WFL for tasks assessed/performed Overall Cognitive Status: History of cognitive impairments - at baseline Area of Impairment: Memory;Attention;Awareness;Safety/judgement  Current Attention Level: Selective Memory: Decreased short-term memory   Safety/Judgement: Decreased awareness of safety;Decreased awareness of deficits Awareness: Intellectual   General Comments: Pt  scored 17/30 on MOCA 7.1 with noted deficits in memory and attention. Pt additionally with poor executive functioning. Unable to understand the connection between his memory deficits and his lack of safety with medication management.       General Comments General comments (skin integrity, edema, etc.): VSS        Assessment/Plan    PT Assessment Patient needs continued PT services  PT Problem List Decreased strength;Decreased activity tolerance;Decreased balance;Decreased mobility;Decreased safety awareness       PT Treatment Interventions DME instruction;Gait training;Stair training;Functional mobility training;Therapeutic activities;Therapeutic exercise;Balance training;Patient/family education;Cognitive remediation    PT Goals (Current goals can be found in the Care Plan section)  Acute Rehab PT Goals Patient Stated Goal: to go home not SNF PT Goal Formulation: With patient Time For Goal Achievement: 08/03/17 Potential to Achieve Goals: Fair    Frequency Min 3X/week   Barriers to discharge Decreased caregiver support         AM-PAC PT "6 Clicks" Daily Activity  Outcome Measure Difficulty turning over in bed (including adjusting bedclothes, sheets and blankets)?: A Little Difficulty moving from lying on back to sitting on the side of the bed? : A Little Difficulty sitting down on and standing up from a chair with arms (e.g., wheelchair, bedside commode, etc,.)?: A Little Help needed moving to and from a bed to chair (including a wheelchair)?: A Little Help needed walking in hospital room?: A Little Help needed climbing 3-5 steps with a railing? : A Lot 6 Click Score: 17    End of Session Equipment Utilized During Treatment: Gait belt Activity Tolerance: Patient tolerated treatment well Patient left: in bed;with call bell/phone within reach Nurse Communication: Mobility status PT Visit Diagnosis: Unsteadiness on feet (R26.81);Other abnormalities of gait and mobility  (R26.89);Muscle weakness (generalized) (M62.81);Difficulty in walking, not elsewhere classified (R26.2)    Time: 1610-9604 PT Time Calculation (min) (ACUTE ONLY): 18 min   Charges:   PT Evaluation $PT Eval Low Complexity: 1 Low     PT G Codes:        Beatriz Settles B. Migdalia Dk PT, DPT Acute Rehabilitation  [phone] Pager [phone]    [place] 07/27/2017, 1:12 PM

## 2017-07-28 LAB — GLUCOSE, CAPILLARY
Glucose-Capillary: 178 mg/dL — ABNORMAL HIGH (ref 65–99)
Glucose-Capillary: 158 mg/dL — ABNORMAL HIGH (ref 65–99)
Glucose-Capillary: 223 mg/dL — ABNORMAL HIGH (ref 65–99)

## 2017-07-28 LAB — BASIC METABOLIC PANEL
Anion gap: 7 (ref 5–15)
BUN: 18 mg/dL (ref 6–20)
CO2: 22 mmol/L (ref 22–32)
Calcium: 8.5 mg/dL — ABNORMAL LOW (ref 8.9–10.3)
Chloride: 108 mmol/L (ref 101–111)
Creatinine, Ser: 1.18 mg/dL (ref 0.61–1.24)
GFR calc Af Amer: >60 mL/min (ref >60)
GFR calc non Af Amer: 60 mL/min — ABNORMAL LOW (ref >60)
Glucose, Bld: 211 mg/dL — ABNORMAL HIGH (ref 65–99)
Potassium: 4.4 mmol/L (ref 3.5–5.1)
Sodium: 137 mmol/L (ref 135–145)

## 2017-07-28 NOTE — Progress Notes (Signed)
Patient given discharge instructions, medication list and follow up appointments. Patient verbalized understanding. IV and tele dcd. Reviewed discharge information also with Daughter. Will discharge home with daughter, transported to hospital exit via wheelchair with hospital volunteer. Shaunie Boehm, Bettina Gavia rN

## 2017-07-28 NOTE — Discharge Summary (Addendum)
Physician Discharge Summary  [name] [name] SRP:[id number] DOB: [birth year] DOA: 07/25/2017  PCP: [name], [name]  Admit date: 07/25/2017 Discharge date: 07/28/2017  Admitted From: home  Disposition:  Home   Recommendations for Outpatient Follow-up:  1. Follow up with PCP in 1-2 weeks 2. Please obtain BMP/CBC in one week 3. Needs close follow up with PCP for DM management.  4. He continue to refuse SNF  Home Health: yes.  Discharge Condition: stable.  CODE STATUS: full code  Diet recommendation: Carb Modified  Brief/Interim Summary:  [name] [name] a 71 y.o.malewith medical history significant for diabetes mellitus on insulin, dementia, depression and anxiety, history of prior CVA. Patient has had at least 12 ED visits and/or admissions in 2446 for complications related to diabetes: DKA or hypoglycemia after issues with proper medication administration.He was just discharged on 9/18 after an episode of hypoglycemia after inadvertently dosing himself twice with insulin. His Lantus dosage at that time was decreased to 10 units per night with sliding scale insulin ordered as meal coverage but only if he eats at least 50% of his meals. It has been previously documented in the past that patient hasalso forgotten to eat during the day.   He was brought to the ER today by his daughter due to altered mentation and CBG reading "high" on glucometer. A dose of NovoLog insulin reportedly was given prior to arrival. In triage patient was noted to be tachypneic and dehydrated but normotensive and not hypoxic. His serum glucose was markedly elevated at 854, and profound hyponatremia of 124 with chloride 88 and CO2 less than 7, anion gap unable to be calculated. He also had a significant acute kidney injury with a BUN of 60 /creatinine of 3.41.Chest x-ray was unremarkable. Urinalysis is pending but due to degree of dehydration patient has yet to void. He has been given 2 L of fluid in the ER as well  as orders have been placed to initiate an insulin infusion for his DKA.    Assessment & Plan:   Principal Problem:   DKA (diabetic ketoacidoses) ([place]) Active Problems:   History of cerebrovascular accident (CVA) in adulthood   HTN (hypertension)   Depression with anxiety   Left ventricular diastolic dysfunction   Dementia   Acute kidney injury ([place])   1-DKA; Resolved. Due to no complaint with medications.  He was on insulin gtt. Now on lantus, SSI. Gap close.  IV fluids.  Continue with lantus  And 3 units with meals coverage Need HH nurse follow up at home.  He continue to decline SNF, daughter will take him home. [place] arrange  2-AKI; secondary to  DKA.  Continue with IV fluids.  Improving with IV fluids.  Cr peak to 3.6, it has decreased to 1.7.  Metabolic acidosis, improved with sodium bicarb and correction of renal failure.  Resolved.   3-Hyperkalemia; resolved.   4-HTN (hypertension)/Left ventricular diastolic dysfunction -Current blood pressure readings suboptimal setting of profound dehydration -Hold preadmission  Carvedilol due to bradycardia.  Resume norvasc,   History of cerebrovascular accident (CVA) in adulthood -On baby aspirin at home, resume.  -Not on statin  Depression with anxiety -On Prozac at home  Dementia; daughter to help with care. [place] arrange.     Discharge Diagnoses:  Principal Problem:   DKA (diabetic ketoacidoses) ([place]) Active Problems:   History of cerebrovascular accident (CVA) in adulthood   HTN (hypertension)   Depression with anxiety   Left ventricular diastolic dysfunction   Dementia  Acute kidney injury [facility])    Discharge Instructions  Discharge Instructions    Diet - low sodium heart healthy    Complete by:  As directed    Increase activity slowly    Complete by:  As directed      Allergies as of 07/28/2017   No Known Allergies     Medication List    STOP taking these medications    acetaminophen 325 MG tablet Commonly known as:  TYLENOL   carvedilol 6.25 MG tablet Commonly known as:  COREG   INSULIN SYRINGE .3CC/31GX5/16" 31G X 5/16" 0.3 ML Misc   vitamin B-12 1000 MCG tablet Commonly known as:  CYANOCOBALAMIN     TAKE these medications   amLODipine 10 MG tablet Commonly known as:  NORVASC TAKE 1 TABLET ONCE DAILY.   aspirin 81 MG EC tablet Take 1 tablet (81 mg total) by mouth daily.   blood glucose meter kit and supplies Dispense based on patient and insurance preference. Use up to four times daily as directed. (FOR ICD-9 250.00, 250.01).   feeding supplement (GLUCERNA SHAKE) Liqd Take 237 mLs by mouth 2 (two) times daily between meals.   FLUoxetine 40 MG capsule Commonly known as:  PROZAC Take 1 capsule (40 mg total) by mouth daily.   glucagon 1 MG injection Commonly known as:  GLUCAGON EMERGENCY Inject 1 mg into the vein once as needed.   insulin aspart 100 UNIT/ML FlexPen Commonly known as:  NOVOLOG Inject 3 Units into the skin 3 (three) times daily with meals. For blood sugars greater than 150 only   Insulin Glargine 100 UNIT/ML Solostar Pen Commonly known as:  LANTUS SOLOSTAR Inject 10 Units into the skin daily at 10 pm.   Insulin Pen Needle 29G X 10MM Misc To attached to insulin pen and administer medication as prescribed.       No Known Allergies  Consultations:  none   Procedures/Studies: Dg Chest 2 View  Result Date: 07/25/2017 CLINICAL DATA:  Diabetic ketoacidosis. EXAM: CHEST  2 VIEW COMPARISON:  Chest x-ray dated June 27, 2017. FINDINGS: The cardiomediastinal silhouette is normal in size. Normal pulmonary vascularity. No focal consolidation, pleural effusion, or pneumothorax. Unchanged compression deformities of T6 and T7. No acute osseous abnormality. IMPRESSION: No active cardiopulmonary disease. Electronically Signed   By: Titus Dubin M.D.   On: 07/25/2017 09:43     Subjective: He is alert, I no  distress.    Discharge Exam: Vitals:   07/28/17 0400 07/28/17 1027  BP: 140/84 137/80  Pulse: (!) 58   Resp: (!) 25 18  Temp:    SpO2: 97%    Vitals:   07/28/17 0300 07/28/17 0356 07/28/17 0400 07/28/17 1027  BP: 128/69  140/84 137/80  Pulse: (!) 29 (!) 58 (!) 58   Resp: (!) 21 (!) 25 (!) 25 18  Temp:  98.7 F (37.1 C)    TempSrc:  Oral    SpO2: (!) 88% 97% 97%   Weight:  62.9 kg (138 lb 10.7 oz)    Height:        General: Pt is alert, awake, not in acute distress Cardiovascular: RRR, S1/S2 +, no rubs, no gallops Respiratory: CTA bilaterally, no wheezing, no rhonchi Abdominal: Soft, NT, ND, bowel sounds + Extremities: no edema, no cyanosis    The results of significant diagnostics from this hospitalization (including imaging, microbiology, ancillary and laboratory) are listed below for reference.     Microbiology: Recent Results (from the past 240 hour(s))  MRSA PCR Screening     Status: None   Collection Time: 07/25/17 11:12 PM  Result Value Ref Range Status   MRSA by PCR NEGATIVE NEGATIVE Final    Comment:        The GeneXpert MRSA Assay (FDA approved for NASAL specimens only), is one component of a comprehensive MRSA colonization surveillance program. It is not intended to diagnose MRSA infection nor to guide or monitor treatment for MRSA infections.      Labs: BNP (last 3 results) No results for input(s): BNP in the last 8760 hours. Basic Metabolic Panel:  Recent Labs Lab 07/25/17 2225 07/26/17 0350 07/26/17 0701 07/27/17 0432 07/28/17 0333  NA 132* 135 135 137 137  K 4.5 4.3 4.4 3.8 4.4  CL 103 106 106 108 108  CO2 15* 19* 17* 21* 22  GLUCOSE 222* 103* 191* 112* 211*  BUN 61* 56* 54* 42* 18  CREATININE 3.32* 2.82* 2.74* 1.65* 1.18  CALCIUM 8.3* 8.0* 8.3* 8.2* 8.5*   Liver Function Tests: No results for input(s): AST, ALT, ALKPHOS, BILITOT, PROT, ALBUMIN in the last 168 hours. No results for input(s): LIPASE, AMYLASE in the last 168  hours. No results for input(s): AMMONIA in the last 168 hours. CBC:  Recent Labs Lab 07/25/17 1051 07/27/17 0432  WBC 16.4* 7.3  NEUTROABS 12.8*  --   HGB 11.5* 8.9*  HCT 37.3* 25.9*  MCV 95.2 87.8  PLT 211 164   Cardiac Enzymes: No results for input(s): CKTOTAL, CKMB, CKMBINDEX, TROPONINI in the last 168 hours. BNP: Invalid input(s): POCBNP CBG:  Recent Labs Lab 07/27/17 1611 07/27/17 2009 07/27/17 2343 07/28/17 0350 07/28/17 0630  GLUCAP 231* 100* 160* 178* 158*   D-Dimer No results for input(s): DDIMER in the last 72 hours. Hgb A1c No results for input(s): HGBA1C in the last 72 hours. Lipid Profile No results for input(s): CHOL, HDL, LDLCALC, TRIG, CHOLHDL, LDLDIRECT in the last 72 hours. Thyroid function studies No results for input(s): TSH, T4TOTAL, T3FREE, THYROIDAB in the last 72 hours.  Invalid input(s): FREET3 Anemia work up No results for input(s): VITAMINB12, FOLATE, FERRITIN, TIBC, IRON, RETICCTPCT in the last 72 hours. Urinalysis    Component Value Date/Time   COLORURINE YELLOW 07/25/2017 0817   APPEARANCEUR HAZY (A) 07/25/2017 0817   LABSPEC 1.012 07/25/2017 0817   PHURINE 5.0 07/25/2017 0817   GLUCOSEU >=500 (A) 07/25/2017 0817   HGBUR SMALL (A) 07/25/2017 0817   BILIRUBINUR NEGATIVE 07/25/2017 0817   KETONESUR 20 (A) 07/25/2017 0817   PROTEINUR 30 (A) 07/25/2017 0817   UROBILINOGEN 0.2 05/12/2017 0939   NITRITE NEGATIVE 07/25/2017 0817   LEUKOCYTESUR NEGATIVE 07/25/2017 0817   Sepsis Labs Invalid input(s): PROCALCITONIN,  WBC,  LACTICIDVEN Microbiology Recent Results (from the past 240 hour(s))  MRSA PCR Screening     Status: None   Collection Time: 07/25/17 11:12 PM  Result Value Ref Range Status   MRSA by PCR NEGATIVE NEGATIVE Final    Comment:        The GeneXpert MRSA Assay (FDA approved for NASAL specimens only), is one component of a comprehensive MRSA colonization surveillance program. It is not intended to diagnose  MRSA infection nor to guide or monitor treatment for MRSA infections.      Time coordinating discharge: Over 30 minutes  SIGNED:   Elmarie Shiley, MD  [facility] 07/28/2017, 10:33 AM Pager   If 7PM-7AM, please contact night-coverage [email or web address] Password [email or web address]

## 2017-07-28 NOTE — Care Management Note (Addendum)
Case Management Note Marvetta Gibbons RN, BSN Unit 4E-Case Manager [phone]  Patient Details  Name: [name] [name] MRN: [id number] Date of Birth: [birth year]  Subjective/Objective:  Pt admitted with DKA                Action/Plan: PTA pt lived at home alone, was active with [facility] for [facility] services- RN/PT/OT/aide- under [place] program.- daughter works but check on pt during day. PT/OT evals pending on this admission- if pt returns home will need resumption of HH services- [facility] will continue to follow under [place] for enhanced [place] services.   Expected Discharge Date:  07/28/17               Expected Discharge Plan:  [place]  In-House Referral:  Clinical Social Work  Discharge planning Services  CM Consult  Post Acute Care Choice:  Home Health, Resumption of Svcs/PTA Provider Choice offered to:  Adult Children, Patient  DME Arranged:  N/A DME Agency:  NA  HH Arranged:  RN, PT, OT, Nurse's Aide [place] Agency:  [place]  Status of Service:  Completed, signed off  If discussed at [place] of Stay Meetings, dates discussed:  10/16  Discharge Disposition: home/home health   Additional Comments:  07/28/17- 1100- Adeleigh Barletta RN, CM- pt for d/c home today- orders to resume [facility] services have been placed- have notified Jermaine with [facility] for resumption of [place] services- pt will remain under [place] program at this time. Update- notified by [facility] that pt had discharged them from [facility] services prior to admission- and would not let them come out to his home- they also report that they had some issues with pt prior to that with pt answering his phone and letting them in when they arrived for visits- [facility] is willing to try again with [facility] services but feel like it would be best if someone from the family could be there when they make their visit- spoke again with pt at bedside who is now stating that he does not want [facility] coming and will not let them in if they do- asked if this CM could  call his daughter- pt gave verbal permission to call daughter- call made to Karna Christmas- pt's daughter to discuss the [facility] situation- Karna Christmas states she will speak with pt and see if she can get him to be agreeable to services. Explained to her that if he is not agreeable then [facility] or any agency would not be able to provide services.   07/27/17- 1600- Amya Hlad RN, CM-  Spoke with pt and daughter at bedside- daughter would like to continue [facility] services at discharge with [facility]- pt is not agreeable to SNF placement at this time- daughter asked about other [facility] services- explained that pt had high level of services under [place] program with [facility]- if pt needed services such as PCS aides to provide more frequent services daughter would need to arrange this time of service and it would be out of pocket expense as medicare does not cover this- daughter states she checks on pt as much as possible but works a lot. Family will be available to transport pt home tomorrow.  Will have MD resume [place] orders for discharge.   Dawayne Patricia, RN 07/28/2017, 11:02 AM

## 2017-08-01 ENCOUNTER — Inpatient Hospital Stay (HOSPITAL_COMMUNITY): Payer: Medicare Other

## 2017-08-01 ENCOUNTER — Emergency Department (HOSPITAL_COMMUNITY): Payer: Medicare Other

## 2017-08-01 ENCOUNTER — Inpatient Hospital Stay (HOSPITAL_COMMUNITY)
Admission: EM | Admit: 2017-08-01 | Discharge: 2017-08-13 | DRG: 870 | Disposition: A | Discharge status: Skilled Nursing Facility | Payer: Medicare Other | Attending: Family Medicine | Admitting: Family Medicine

## 2017-08-01 DIAGNOSIS — Z0189 Encounter for other specified special examinations: Secondary | ICD-10-CM

## 2017-08-01 DIAGNOSIS — Z8349 Family history of other endocrine, nutritional and metabolic diseases: Secondary | ICD-10-CM

## 2017-08-01 DIAGNOSIS — J690 Pneumonitis due to inhalation of food and vomit: Secondary | ICD-10-CM | POA: Diagnosis not present

## 2017-08-01 DIAGNOSIS — E874 Mixed disorder of acid-base balance: Secondary | ICD-10-CM | POA: Diagnosis not present

## 2017-08-01 DIAGNOSIS — F17210 Nicotine dependence, cigarettes, uncomplicated: Secondary | ICD-10-CM | POA: Diagnosis present

## 2017-08-01 DIAGNOSIS — F419 Anxiety disorder, unspecified: Secondary | ICD-10-CM | POA: Diagnosis not present

## 2017-08-01 DIAGNOSIS — A419 Sepsis, unspecified organism: Secondary | ICD-10-CM | POA: Diagnosis not present

## 2017-08-01 DIAGNOSIS — I639 Cerebral infarction, unspecified: Secondary | ICD-10-CM | POA: Diagnosis not present

## 2017-08-01 DIAGNOSIS — G9341 Metabolic encephalopathy: Secondary | ICD-10-CM | POA: Diagnosis present

## 2017-08-01 DIAGNOSIS — R1312 Dysphagia, oropharyngeal phase: Secondary | ICD-10-CM | POA: Diagnosis not present

## 2017-08-01 DIAGNOSIS — N182 Chronic kidney disease, stage 2 (mild): Secondary | ICD-10-CM | POA: Diagnosis present

## 2017-08-01 DIAGNOSIS — E875 Hyperkalemia: Secondary | ICD-10-CM | POA: Diagnosis present

## 2017-08-01 DIAGNOSIS — R633 Feeding difficulties: Secondary | ICD-10-CM | POA: Diagnosis not present

## 2017-08-01 DIAGNOSIS — Z820 Family history of epilepsy and other diseases of the nervous system: Secondary | ICD-10-CM

## 2017-08-01 DIAGNOSIS — E118 Type 2 diabetes mellitus with unspecified complications: Secondary | ICD-10-CM | POA: Diagnosis not present

## 2017-08-01 DIAGNOSIS — R1313 Dysphagia, pharyngeal phase: Secondary | ICD-10-CM | POA: Diagnosis not present

## 2017-08-01 DIAGNOSIS — E1111 Type 2 diabetes mellitus with ketoacidosis with coma: Secondary | ICD-10-CM | POA: Diagnosis not present

## 2017-08-01 DIAGNOSIS — Z7982 Long term (current) use of aspirin: Secondary | ICD-10-CM

## 2017-08-01 DIAGNOSIS — E1310 Other specified diabetes mellitus with ketoacidosis without coma: Secondary | ICD-10-CM | POA: Diagnosis not present

## 2017-08-01 DIAGNOSIS — J9601 Acute respiratory failure with hypoxia: Secondary | ICD-10-CM | POA: Diagnosis not present

## 2017-08-01 DIAGNOSIS — M6281 Muscle weakness (generalized): Secondary | ICD-10-CM | POA: Diagnosis not present

## 2017-08-01 DIAGNOSIS — R9431 Abnormal electrocardiogram [ECG] [EKG]: Secondary | ICD-10-CM | POA: Diagnosis not present

## 2017-08-01 DIAGNOSIS — E1065 Type 1 diabetes mellitus with hyperglycemia: Secondary | ICD-10-CM | POA: Diagnosis not present

## 2017-08-01 DIAGNOSIS — Z8249 Family history of ischemic heart disease and other diseases of the circulatory system: Secondary | ICD-10-CM | POA: Diagnosis not present

## 2017-08-01 DIAGNOSIS — D638 Anemia in other chronic diseases classified elsewhere: Secondary | ICD-10-CM | POA: Diagnosis present

## 2017-08-01 DIAGNOSIS — Z8673 Personal history of transient ischemic attack (TIA), and cerebral infarction without residual deficits: Secondary | ICD-10-CM | POA: Diagnosis not present

## 2017-08-01 DIAGNOSIS — R739 Hyperglycemia, unspecified: Secondary | ICD-10-CM | POA: Diagnosis not present

## 2017-08-01 DIAGNOSIS — R41841 Cognitive communication deficit: Secondary | ICD-10-CM | POA: Diagnosis not present

## 2017-08-01 DIAGNOSIS — E1365 Other specified diabetes mellitus with hyperglycemia: Secondary | ICD-10-CM | POA: Diagnosis not present

## 2017-08-01 DIAGNOSIS — J9811 Atelectasis: Secondary | ICD-10-CM | POA: Diagnosis not present

## 2017-08-01 DIAGNOSIS — I5032 Chronic diastolic (congestive) heart failure: Secondary | ICD-10-CM | POA: Diagnosis present

## 2017-08-01 DIAGNOSIS — J988 Other specified respiratory disorders: Secondary | ICD-10-CM | POA: Diagnosis not present

## 2017-08-01 DIAGNOSIS — Z9289 Personal history of other medical treatment: Secondary | ICD-10-CM

## 2017-08-01 DIAGNOSIS — I959 Hypotension, unspecified: Secondary | ICD-10-CM | POA: Diagnosis not present

## 2017-08-01 DIAGNOSIS — J9690 Respiratory failure, unspecified, unspecified whether with hypoxia or hypercapnia: Secondary | ICD-10-CM | POA: Diagnosis not present

## 2017-08-01 DIAGNOSIS — D631 Anemia in chronic kidney disease: Secondary | ICD-10-CM | POA: Diagnosis not present

## 2017-08-01 DIAGNOSIS — I6309 Cerebral infarction due to thrombosis of other precerebral artery: Secondary | ICD-10-CM | POA: Diagnosis not present

## 2017-08-01 DIAGNOSIS — R296 Repeated falls: Secondary | ICD-10-CM | POA: Diagnosis not present

## 2017-08-01 DIAGNOSIS — D6959 Other secondary thrombocytopenia: Secondary | ICD-10-CM | POA: Diagnosis present

## 2017-08-01 DIAGNOSIS — Z96641 Presence of right artificial hip joint: Secondary | ICD-10-CM | POA: Diagnosis present

## 2017-08-01 DIAGNOSIS — N183 Chronic kidney disease, stage 3 (moderate): Secondary | ICD-10-CM | POA: Diagnosis not present

## 2017-08-01 DIAGNOSIS — Z833 Family history of diabetes mellitus: Secondary | ICD-10-CM | POA: Diagnosis not present

## 2017-08-01 DIAGNOSIS — R278 Other lack of coordination: Secondary | ICD-10-CM | POA: Diagnosis not present

## 2017-08-01 DIAGNOSIS — E1122 Type 2 diabetes mellitus with diabetic chronic kidney disease: Secondary | ICD-10-CM | POA: Diagnosis present

## 2017-08-01 DIAGNOSIS — E870 Hyperosmolality and hypernatremia: Secondary | ICD-10-CM | POA: Diagnosis not present

## 2017-08-01 DIAGNOSIS — E878 Other disorders of electrolyte and fluid balance, not elsewhere classified: Secondary | ICD-10-CM | POA: Diagnosis not present

## 2017-08-01 DIAGNOSIS — J189 Pneumonia, unspecified organism: Secondary | ICD-10-CM | POA: Diagnosis not present

## 2017-08-01 DIAGNOSIS — E1169 Type 2 diabetes mellitus with other specified complication: Secondary | ICD-10-CM | POA: Diagnosis not present

## 2017-08-01 DIAGNOSIS — Z794 Long term (current) use of insulin: Secondary | ICD-10-CM

## 2017-08-01 DIAGNOSIS — J181 Lobar pneumonia, unspecified organism: Secondary | ICD-10-CM | POA: Diagnosis not present

## 2017-08-01 DIAGNOSIS — D649 Anemia, unspecified: Secondary | ICD-10-CM | POA: Diagnosis not present

## 2017-08-01 DIAGNOSIS — E876 Hypokalemia: Secondary | ICD-10-CM | POA: Diagnosis not present

## 2017-08-01 DIAGNOSIS — I130 Hypertensive heart and chronic kidney disease with heart failure and stage 1 through stage 4 chronic kidney disease, or unspecified chronic kidney disease: Secondary | ICD-10-CM | POA: Diagnosis present

## 2017-08-01 DIAGNOSIS — E1110 Type 2 diabetes mellitus with ketoacidosis without coma: Secondary | ICD-10-CM | POA: Diagnosis present

## 2017-08-01 DIAGNOSIS — J9600 Acute respiratory failure, unspecified whether with hypoxia or hypercapnia: Secondary | ICD-10-CM | POA: Diagnosis not present

## 2017-08-01 DIAGNOSIS — E1159 Type 2 diabetes mellitus with other circulatory complications: Secondary | ICD-10-CM | POA: Diagnosis not present

## 2017-08-01 DIAGNOSIS — Z9119 Patient's noncompliance with other medical treatment and regimen: Secondary | ICD-10-CM | POA: Diagnosis not present

## 2017-08-01 DIAGNOSIS — E1010 Type 1 diabetes mellitus with ketoacidosis without coma: Secondary | ICD-10-CM | POA: Diagnosis not present

## 2017-08-01 DIAGNOSIS — E1011 Type 1 diabetes mellitus with ketoacidosis with coma: Secondary | ICD-10-CM | POA: Diagnosis not present

## 2017-08-01 DIAGNOSIS — D696 Thrombocytopenia, unspecified: Secondary | ICD-10-CM | POA: Diagnosis not present

## 2017-08-01 DIAGNOSIS — N179 Acute kidney failure, unspecified: Secondary | ICD-10-CM | POA: Diagnosis not present

## 2017-08-01 DIAGNOSIS — I10 Essential (primary) hypertension: Secondary | ICD-10-CM | POA: Diagnosis not present

## 2017-08-01 DIAGNOSIS — Z4682 Encounter for fitting and adjustment of non-vascular catheter: Secondary | ICD-10-CM | POA: Diagnosis not present

## 2017-08-01 DIAGNOSIS — R579 Shock, unspecified: Secondary | ICD-10-CM | POA: Diagnosis not present

## 2017-08-01 DIAGNOSIS — N170 Acute kidney failure with tubular necrosis: Secondary | ICD-10-CM | POA: Diagnosis present

## 2017-08-01 DIAGNOSIS — R4020 Unspecified coma: Secondary | ICD-10-CM | POA: Diagnosis not present

## 2017-08-01 DIAGNOSIS — G9340 Encephalopathy, unspecified: Secondary | ICD-10-CM | POA: Diagnosis not present

## 2017-08-01 LAB — I-STAT CHEM 8, ED
BUN: 56 mg/dL — ABNORMAL HIGH (ref 6–20)
Calcium, Ion: 1.15 mmol/L (ref 1.15–1.40)
Chloride: 101 mmol/L (ref 101–111)
Creatinine, Ser: 3.6 mg/dL — ABNORMAL HIGH (ref 0.61–1.24)
Glucose, Bld: >700 mg/dL (ref 65–99)
HCT: 30 % — ABNORMAL LOW (ref 39.0–52.0)
Hemoglobin: 10.2 g/dL — ABNORMAL LOW (ref 13.0–17.0)
Potassium: 7.8 mmol/L (ref 3.5–5.1)
Sodium: 130 mmol/L — ABNORMAL LOW (ref 135–145)
TCO2: 6 mmol/L — ABNORMAL LOW (ref 22–32)

## 2017-08-01 LAB — I-STAT ARTERIAL BLOOD GAS, ED
Acid-base deficit: 30 mmol/L — ABNORMAL HIGH (ref 0.0–2.0)
Bicarbonate: 2.8 mmol/L — ABNORMAL LOW (ref 20.0–28.0)
O2 Saturation: 99 %
Patient temperature: 95.2
TCO2: <5 mmol/L — ABNORMAL LOW (ref 22–32)
pCO2 arterial: 15 mmHg — CL (ref 32.0–48.0)
pH, Arterial: 6.86 — CL (ref 7.350–7.450)
pO2, Arterial: 281 mmHg — ABNORMAL HIGH (ref 83.0–108.0)

## 2017-08-01 LAB — POCT I-STAT 3, ART BLOOD GAS (G3+)
Acid-base deficit: 24 mmol/L — ABNORMAL HIGH (ref 0.0–2.0)
Bicarbonate: 5.9 mmol/L — ABNORMAL LOW (ref 20.0–28.0)
O2 Saturation: 99 %
Patient temperature: 97.8
TCO2: 7 mmol/L — ABNORMAL LOW (ref 22–32)
pCO2 arterial: 23.9 mmHg — ABNORMAL LOW (ref 32.0–48.0)
pH, Arterial: 7.001 — CL (ref 7.350–7.450)
pO2, Arterial: 186 mmHg — ABNORMAL HIGH (ref 83.0–108.0)

## 2017-08-01 LAB — BASIC METABOLIC PANEL
Anion gap: 31 — ABNORMAL HIGH (ref 5–15)
BUN: 49 mg/dL — ABNORMAL HIGH (ref 6–20)
BUN: 45 mg/dL — ABNORMAL HIGH (ref 6–20)
CO2: <7 mmol/L — ABNORMAL LOW (ref 22–32)
CO2: 8 mmol/L — ABNORMAL LOW (ref 22–32)
Calcium: 9 mg/dL (ref 8.9–10.3)
Calcium: 7.7 mg/dL — ABNORMAL LOW (ref 8.9–10.3)
Chloride: 92 mmol/L — ABNORMAL LOW (ref 101–111)
Chloride: 100 mmol/L — ABNORMAL LOW (ref 101–111)
Creatinine, Ser: 4.3 mg/dL — ABNORMAL HIGH (ref 0.61–1.24)
Creatinine, Ser: 4.22 mg/dL — ABNORMAL HIGH (ref 0.61–1.24)
GFR calc Af Amer: 15 mL/min — ABNORMAL LOW (ref >60)
GFR calc Af Amer: 15 mL/min — ABNORMAL LOW (ref >60)
GFR calc non Af Amer: 13 mL/min — ABNORMAL LOW (ref >60)
GFR calc non Af Amer: 13 mL/min — ABNORMAL LOW (ref >60)
Glucose, Bld: 1139 mg/dL (ref 65–99)
Glucose, Bld: 924 mg/dL (ref 65–99)
Potassium: 7.4 mmol/L (ref 3.5–5.1)
Potassium: 4 mmol/L (ref 3.5–5.1)
Sodium: 131 mmol/L — ABNORMAL LOW (ref 135–145)
Sodium: 139 mmol/L (ref 135–145)

## 2017-08-01 LAB — CBG MONITORING, ED
Glucose-Capillary: >600 mg/dL (ref 65–99)
Glucose-Capillary: >600 mg/dL (ref 65–99)
Glucose-Capillary: >600 mg/dL (ref 65–99)
Glucose-Capillary: >600 mg/dL (ref 65–99)

## 2017-08-01 LAB — CBC WITH DIFFERENTIAL/PLATELET
Basophils Absolute: 0 10*3/uL (ref 0.0–0.1)
Basophils Relative: 0 %
Eosinophils Absolute: 0.2 10*3/uL (ref 0.0–0.7)
Eosinophils Relative: 1 %
HCT: 33.1 % — ABNORMAL LOW (ref 39.0–52.0)
Hemoglobin: 8.7 g/dL — ABNORMAL LOW (ref 13.0–17.0)
Lymphocytes Relative: 13 %
Lymphs Abs: 2.6 10*3/uL (ref 0.7–4.0)
MCH: 30.2 pg (ref 26.0–34.0)
MCHC: 26.3 g/dL — ABNORMAL LOW (ref 30.0–36.0)
MCV: 114.9 fL — ABNORMAL HIGH (ref 78.0–100.0)
Monocytes Absolute: 2 10*3/uL — ABNORMAL HIGH (ref 0.1–1.0)
Monocytes Relative: 10 %
Neutro Abs: 15.5 10*3/uL — ABNORMAL HIGH (ref 1.7–7.7)
Neutrophils Relative %: 76 %
Platelets: 324 10*3/uL (ref 150–400)
RBC: 2.88 MIL/uL — ABNORMAL LOW (ref 4.22–5.81)
RDW: 15.1 % (ref 11.5–15.5)
WBC: 20.3 10*3/uL — ABNORMAL HIGH (ref 4.0–10.5)

## 2017-08-01 LAB — COMPREHENSIVE METABOLIC PANEL
ALT: 26 U/L (ref 17–63)
AST: 21 U/L (ref 15–41)
Albumin: 3.3 g/dL — ABNORMAL LOW (ref 3.5–5.0)
Alkaline Phosphatase: 105 U/L (ref 38–126)
BUN: 50 mg/dL — ABNORMAL HIGH (ref 6–20)
CO2: <7 mmol/L — ABNORMAL LOW (ref 22–32)
Calcium: 8.8 mg/dL — ABNORMAL LOW (ref 8.9–10.3)
Chloride: 93 mmol/L — ABNORMAL LOW (ref 101–111)
Creatinine, Ser: 4.48 mg/dL — ABNORMAL HIGH (ref 0.61–1.24)
GFR calc Af Amer: 14 mL/min — ABNORMAL LOW (ref >60)
GFR calc non Af Amer: 12 mL/min — ABNORMAL LOW (ref >60)
Glucose, Bld: 1169 mg/dL (ref 65–99)
Potassium: >7.5 mmol/L (ref 3.5–5.1)
Sodium: 131 mmol/L — ABNORMAL LOW (ref 135–145)
Total Bilirubin: 1.5 mg/dL — ABNORMAL HIGH (ref 0.3–1.2)
Total Protein: 6.3 g/dL — ABNORMAL LOW (ref 6.5–8.1)

## 2017-08-01 LAB — LACTIC ACID, PLASMA: Lactic Acid, Venous: 6.9 mmol/L (ref 0.5–1.9)

## 2017-08-01 LAB — MRSA PCR SCREENING: MRSA by PCR: NEGATIVE

## 2017-08-01 LAB — I-STAT CG4 LACTIC ACID, ED: Lactic Acid, Venous: 7.94 mmol/L (ref 0.5–1.9)

## 2017-08-01 LAB — MAGNESIUM: Magnesium: 2.2 mg/dL (ref 1.7–2.4)

## 2017-08-01 LAB — GLUCOSE, CAPILLARY
Glucose-Capillary: >600 mg/dL (ref 65–99)
Glucose-Capillary: >600 mg/dL (ref 65–99)
Glucose-Capillary: >600 mg/dL (ref 65–99)
Glucose-Capillary: >600 mg/dL (ref 65–99)

## 2017-08-01 LAB — PHOSPHORUS: Phosphorus: 7.3 mg/dL — ABNORMAL HIGH (ref 2.5–4.6)

## 2017-08-01 LAB — TROPONIN I: Troponin I: <0.03 ng/mL (ref <0.03)

## 2017-08-01 LAB — PROCALCITONIN: Procalcitonin: 5.14 ng/mL

## 2017-08-01 MED ORDER — FAMOTIDINE IN NACL 20-0.9 MG/50ML-% IV SOLN
20.0000 mg | INTRAVENOUS | Status: DC
Start: 2017-08-02 — End: 2017-08-03
  Administered 2017-08-02: 20 mg via INTRAVENOUS
  Filled 2017-08-01: qty 50

## 2017-08-01 MED ORDER — HEPARIN SODIUM (PORCINE) 5000 UNIT/ML IJ SOLN
5000.0000 [IU] | Freq: Three times a day (TID) | INTRAMUSCULAR | Status: DC
  Administered 2017-08-02 – 2017-08-13 (×32): 5000 [IU] via SUBCUTANEOUS
  Filled 2017-08-01 (×33): qty 1

## 2017-08-01 MED ORDER — FENTANYL CITRATE (PF) 100 MCG/2ML IJ SOLN
50.0000 ug | Freq: Once | INTRAMUSCULAR | Status: AC
  Administered 2017-08-01: 50 ug via INTRAVENOUS

## 2017-08-01 MED ORDER — FENTANYL CITRATE (PF) 100 MCG/2ML IJ SOLN: 50.0000 ug | Freq: Once | INTRAMUSCULAR | Status: DC

## 2017-08-01 MED ORDER — DEXTROSE-NACL 5-0.45 % IV SOLN
INTRAVENOUS | Status: DC
  Administered 2017-08-02 (×2): via INTRAVENOUS

## 2017-08-01 MED ORDER — PHENYLEPHRINE 40 MCG/ML (10ML) SYRINGE FOR IV PUSH (FOR BLOOD PRESSURE SUPPORT)
120.0000 ug | PREFILLED_SYRINGE | Freq: Once | INTRAVENOUS | Status: DC
  Filled 2017-08-01: qty 10

## 2017-08-01 MED ORDER — MIDAZOLAM HCL 2 MG/2ML IJ SOLN: 2.0000 mg | INTRAMUSCULAR | Status: DC | PRN

## 2017-08-01 MED ORDER — SODIUM BICARBONATE 8.4 % IV SOLN
50.0000 meq | Freq: Once | INTRAVENOUS | Status: AC
  Administered 2017-08-01: 50 meq via INTRAVENOUS
  Filled 2017-08-01: qty 50

## 2017-08-01 MED ORDER — FENTANYL BOLUS VIA INFUSION
25.0000 ug | INTRAVENOUS | Status: DC | PRN
  Administered 2017-08-02 – 2017-08-06 (×5): 25 ug via INTRAVENOUS
  Filled 2017-08-01: qty 25

## 2017-08-01 MED ORDER — FENTANYL 2500MCG IN NS 250ML (10MCG/ML) PREMIX INFUSION
25.0000 ug/h | INTRAVENOUS | Status: DC
  Administered 2017-08-01: 20 ug/h via INTRAVENOUS
  Administered 2017-08-01: 50 ug/h via INTRAVENOUS
  Administered 2017-08-02: 300 ug/h via INTRAVENOUS
  Administered 2017-08-02: 225 ug/h via INTRAVENOUS
  Administered 2017-08-03: 200 ug/h via INTRAVENOUS
  Administered 2017-08-03: 175 ug/h via INTRAVENOUS
  Administered 2017-08-04: 100 ug/h via INTRAVENOUS
  Administered 2017-08-06: 200 ug/h via INTRAVENOUS
  Filled 2017-08-01 (×8): qty 250

## 2017-08-01 MED ORDER — ALBUTEROL (5 MG/ML) CONTINUOUS INHALATION SOLN
10.0000 mg/h | INHALATION_SOLUTION | Freq: Once | RESPIRATORY_TRACT | Status: AC
  Administered 2017-08-01: 10 mg/h via RESPIRATORY_TRACT
  Filled 2017-08-01: qty 20

## 2017-08-01 MED ORDER — SODIUM CHLORIDE 0.9 % IV SOLN
INTRAVENOUS | Status: DC
  Administered 2017-08-01: 5.4 [IU]/h via INTRAVENOUS
  Administered 2017-08-02: 13.8 [IU]/h via INTRAVENOUS
  Administered 2017-08-03: 3.6 [IU]/h via INTRAVENOUS
  Filled 2017-08-01 (×4): qty 1

## 2017-08-01 MED ORDER — FENTANYL CITRATE (PF) 100 MCG/2ML IJ SOLN
INTRAMUSCULAR | Status: AC
  Filled 2017-08-01: qty 2

## 2017-08-01 MED ORDER — PIPERACILLIN-TAZOBACTAM 3.375 G IVPB 30 MIN
3.3750 g | Freq: Once | INTRAVENOUS | Status: AC
  Administered 2017-08-01: 3.375 g via INTRAVENOUS
  Filled 2017-08-01: qty 50

## 2017-08-01 MED ORDER — PIPERACILLIN-TAZOBACTAM IN DEX 2-0.25 GM/50ML IV SOLN
2.2500 g | Freq: Three times a day (TID) | INTRAVENOUS | Status: DC
  Administered 2017-08-01 – 2017-08-04 (×10): 2.25 g via INTRAVENOUS
  Filled 2017-08-01 (×14): qty 50

## 2017-08-01 MED ORDER — VANCOMYCIN HCL IN DEXTROSE 1-5 GM/200ML-% IV SOLN
1000.0000 mg | Freq: Once | INTRAVENOUS | Status: AC
  Administered 2017-08-01: 1000 mg via INTRAVENOUS
  Filled 2017-08-01: qty 200

## 2017-08-01 MED ORDER — CHLORHEXIDINE GLUCONATE 0.12% ORAL RINSE (MEDLINE KIT)
15.0000 mL | Freq: Two times a day (BID) | OROMUCOSAL | Status: DC
  Administered 2017-08-01 – 2017-08-10 (×18): 15 mL via OROMUCOSAL
  Filled 2017-08-01: qty 15

## 2017-08-01 MED ORDER — SODIUM BICARBONATE 8.4 % IV SOLN
INTRAVENOUS | Status: AC
  Filled 2017-08-01: qty 100

## 2017-08-01 MED ORDER — VANCOMYCIN HCL IN DEXTROSE 1-5 GM/200ML-% IV SOLN
1000.0000 mg | INTRAVENOUS | Status: DC
  Administered 2017-08-03: 1000 mg via INTRAVENOUS
  Filled 2017-08-01: qty 200

## 2017-08-01 MED ORDER — STERILE WATER FOR INJECTION IV SOLN
INTRAVENOUS | Status: DC
  Administered 2017-08-01 (×2): via INTRAVENOUS
  Filled 2017-08-01 (×5): qty 850

## 2017-08-01 MED ORDER — SODIUM CHLORIDE 0.9 % IV BOLUS (SEPSIS)
1000.0000 mL | Freq: Once | INTRAVENOUS | Status: AC
  Administered 2017-08-01: 1000 mL via INTRAVENOUS

## 2017-08-01 MED ORDER — SODIUM CHLORIDE 0.9 % IV SOLN: INTRAVENOUS | Status: DC

## 2017-08-01 MED ORDER — NOREPINEPHRINE BITARTRATE 1 MG/ML IV SOLN
0.0000 ug/min | INTRAVENOUS | Status: DC
  Administered 2017-08-01: 3 ug/min via INTRAVENOUS
  Administered 2017-08-01: 10 ug/min via INTRAVENOUS
  Administered 2017-08-01 – 2017-08-02 (×2): 2 ug/min via INTRAVENOUS
  Filled 2017-08-01 (×2): qty 4

## 2017-08-01 MED ORDER — ETOMIDATE 2 MG/ML IV SOLN
INTRAVENOUS | Status: AC
  Filled 2017-08-01: qty 20

## 2017-08-01 MED ORDER — ORAL CARE MOUTH RINSE
15.0000 mL | Freq: Four times a day (QID) | OROMUCOSAL | Status: DC
  Administered 2017-08-02 – 2017-08-10 (×37): 15 mL via OROMUCOSAL

## 2017-08-01 MED ORDER — ETOMIDATE 2 MG/ML IV SOLN
INTRAVENOUS | Status: DC | PRN
  Administered 2017-08-01: 20 mg via INTRAVENOUS

## 2017-08-01 MED ORDER — SODIUM BICARBONATE 8.4 % IV SOLN
INTRAVENOUS | Status: DC | PRN
  Administered 2017-08-01 (×2): 50 meq via INTRAVENOUS

## 2017-08-01 MED ORDER — SODIUM CHLORIDE 0.9 % IV SOLN: INTRAVENOUS | Status: AC

## 2017-08-01 MED ORDER — CALCIUM GLUCONATE 10 % IV SOLN
1.0000 g | Freq: Once | INTRAVENOUS | Status: AC
  Administered 2017-08-01: 1 g via INTRAVENOUS
  Filled 2017-08-01: qty 10

## 2017-08-01 MED ORDER — LACTATED RINGERS IV BOLUS (SEPSIS)
1000.0000 mL | Freq: Once | INTRAVENOUS | Status: AC
  Administered 2017-08-01: 1000 mL via INTRAVENOUS

## 2017-08-01 NOTE — ED Notes (Signed)
Family at bedside. 

## 2017-08-01 NOTE — Progress Notes (Signed)
eLink Physician-Brief Progress Note Patient Name: [name] [name] DOB: 1946/07/02 MRN: [id number]   Date of Service  08/01/2017  HPI/Events of Note  Severe acidosis with hypotension persists.  PH 7.00  eICU Interventions  continue bicarb drip Push 1 amp sodium bicarb     Intervention Category Major Interventions: Acid-Base disturbance - evaluation and management  Mauri Brooklyn, P 08/01/2017, 10:04 PM

## 2017-08-01 NOTE — Progress Notes (Signed)
Per MD, CAT ordered d/t Potassium.  [facility] clear.  MD at bedside now.

## 2017-08-01 NOTE — Progress Notes (Signed)
CRITICAL VALUE ALERT  Critical Value:  Lactic Acid 6.9   Glucose 924  Date & Time Notied:  08/01/17 2218  Provider Notified: Bethann Humble, MD  Orders Received/Actions taken: push 1 amp bicarb and continue bicarb drip

## 2017-08-01 NOTE — ED Provider Notes (Signed)
I saw and evaluated the patient, reviewed the resident's note and I agree with the findings and plan. I was present and provided direct supervision for all procedures. Please see associated encounter note. Briefly, the patient is a 71 yo M here with profound diabetic ketoacidosis and subsequent shock. Pt given fluids, insulin gtt, and temporization of potassium in setting of marked hyperkalemia from his DKA as well as acute renal failure. Initially pt moaning but tachypneic, tolerating his oral secretions - will likely need intubation but he is compensating as well as possible at this time, will aggressively rescucitate, monitor his resp status.  Pt aggressively resuscitated with fluids, bicarb bolus and gtt, and insulin but he became increasingly tachypneic and began having difficulty tolerating his secretions, occasional choking. Pt intubated for airway protection in setting of decompensating mental status and concern for failure to protect airway. Bicarb bolus given prior to intubation, and he tolerated it relatively well though with mild hypotension. Resp rate matched on vent. Admit to ICU.   EKG Interpretation  Date/Time:  Saturday August 01 2017 14:28:19 EDT Ventricular Rate:  99 PR Interval:    QRS Duration: 126 QT Interval:  378 QTC Calculation: 486 R Axis:   -86 Text Interpretation:  Ectopic atrial rhythm Nonspecific IVCD with LAD Probable anteroseptal infarct, old Since last EKG, T waves significantly peaked and QRS widened Confirmed by Duffy Bruce [phone]) on 08/01/2017 4:03:48 PM         Emergency Ultrasound Study:   Angiocath insertion Performed by: Evonnie Pat Consent: Verbal consent/emergent consent obtained. Risks and benefits: risks, benefits and alternatives were discussed Immediately prior to procedure the correct patient, procedure, equipment, support staff and site/side marked as needed.  Indication: difficult IV access Preparation: Patient was prepped and  draped in the usual sterile fashion. Sterile gel was used for this procedure and the ultrasound probe was sterilized prior to use. Vein Location: Right forearm vein was visualized during assessment for potential access sites and was found to be patent/ easily compressed with linear ultrasound.  The needle was visualized with real-time ultrasound and guided into the vein. Gauge: 20  Image saved and stored.  Normal blood return.   Patient tolerance: Patient tolerated the procedure well with no immediate complications.     Emergency Ultrasound Study:   Angiocath insertion Performed by: Evonnie Pat Consent: Verbal consent/emergent consent obtained. Risks and benefits: risks, benefits and alternatives were discussed Immediately prior to procedure the correct patient, procedure, equipment, support staff and site/side marked as needed.  Indication: difficult IV access Preparation: Patient was prepped and draped in the usual sterile fashion. Sterile gel was used for this procedure and the ultrasound probe was sterilized prior to use. Vein Location: Left forearm vein was visualized during assessment for potential access sites and was found to be patent/ easily compressed with linear ultrasound.  The needle was visualized with real-time ultrasound and guided into the vein. Gauge: 18  Image saved and stored.  Normal blood return.   Patient tolerance: Patient tolerated the procedure well with no immediate complications.        Duffy Bruce, MD 08/02/17 [phone]

## 2017-08-01 NOTE — ED Notes (Signed)
Pt's daughter Davy Pique  [phone]

## 2017-08-01 NOTE — ED Notes (Signed)
Pt's sister [name] [phone]

## 2017-08-01 NOTE — ED Notes (Signed)
Bair Hugger applied

## 2017-08-01 NOTE — ED Notes (Signed)
Critical Care doctor at bedside preparing to intubate pt,.

## 2017-08-01 NOTE — Procedures (Signed)
Endotracheal Intubation Procedure Note Indication for endotracheal intubation: airway compromise and impending respiratory failure Sedation: etomidate Paralytic: none Equipment: Macintosh 3 laryngoscope blade and 7.21mm cuffed endotracheal tube Cricoid Pressure: no Number of attempts: 1 ETT location confirmed by by CXR.  Procedure performed by Dr Rene Kocher (ER housestaff) which I supervised. I was present for entirety of procedure, which patient tolerated well. No complications. No blood loss. CXR showed ETT low at level of carina. Retracted ETT by 2cm, now secured 22cm at the lip.

## 2017-08-01 NOTE — Progress Notes (Signed)
Chaplain had prayer with the family and patient.  Also helped a family member to find the room as I went back to my office, encountered someone trying to find the patient.  [name] [name], Chaplain    08/01/17 2300  Clinical Encounter Type  Visited With Patient and family together  Visit Type Initial  Referral From Chaplain  Spiritual Encounters  Spiritual Needs Prayer  Stress Factors  Patient Stress Factors None identified  Family Stress Factors None identified

## 2017-08-01 NOTE — ED Provider Notes (Signed)
INTUBATION Performed by: Pilar Plate Iyanni Hepp  Supervised by: Dr. Jimmey Ralph  Required items: required blood products, implants, devices, and special equipment available Patient identity confirmed: provided demographic data and hospital-assigned identification number Time out: Immediately prior to procedure a "time out" was called to verify the correct patient, procedure, equipment, support staff and site/side marked as required.  Indications: Acute respiratory failure, somnolence, airway protection  Intubation method: Mac 3 DL   Preoxygenation: BVM  Sedatives: Etomidate Paralytic: Not used  Tube Size: 8-0 cuffed  Post-procedure assessment: chest rise and ETCO2 monitor Breath sounds: equal and absent over the epigastrium Tube secured with: ETT holder Chest x-ray interpreted by radiologist and me.  Chest x-ray findings: endotracheal tube too low, retracted 2cm.   Patient tolerated the procedure well with no immediate complications.       [name] Emerald, MD 08/01/17 Lattie Corns    Duffy Bruce, MD 08/03/17 [phone]

## 2017-08-01 NOTE — ED Triage Notes (Signed)
Per EMS- recent admission for DKA. Pt not taking his insulin at home. Pt found altered with increased respiratory rate with CBG reading HIGH.

## 2017-08-01 NOTE — Progress Notes (Signed)
Pharmacy Antibiotic Note  [name] [name] is a 71 y.o. male admitted on 08/01/2017 with sepsis.  Pharmacy has been consulted for vancomycin and zosyn dosing. He is acute renal failure with Cr 3.6 and CrCl 16.75ml/min.  Vancomycin trough goal 15-20  Plan: 1) Vancomycin 1g IV q48 2) Zosyn 2.25g IV q8 3) Follow renal function, cultures, LOT, level if needed     Temp (24hrs), Avg:95.2 F (35.1 C), Min:95.2 F (35.1 C), Max:95.2 F (35.1 C)   Recent Labs Lab 07/26/17 0701 07/27/17 0432 07/28/17 0333 08/01/17 1501 08/01/17 1619  WBC  --  7.3  --  PENDING  --   CREATININE 2.74* 1.65* 1.18 4.30* 3.60*  LATICACIDVEN  --   --   --   --  7.94*    Estimated Creatinine Clearance: 16.5 mL/min (A) (by C-G formula based on SCr of 3.6 mg/dL (H)).    No Known Allergies  Antimicrobials this admission: 10/20 Vancomycin >> 10/20 Zosyn >>  Dose adjustments this admission: n/a  Microbiology results: 10/20 blood x2 >> 10/20 urine >>  Thank you for allowing pharmacy to be a part of this patient's care.  Deboraha Sprang 08/01/2017 4:30 PM

## 2017-08-01 NOTE — ED Provider Notes (Signed)
[facility] NEURO/TRAUMA/SURGICAL ICU Provider Note   CSN: [id number] Arrival date & time: 08/01/17  1423     History   Chief Complaint Chief Complaint  Patient presents with  . Diabetic Ketoacidosis    HPI [name] [name] is a 71 y.o. male.  71 year old male history of insulin-dependent diabetes, recurrent DKA, stroke, UTI, and DVT who presents with altered mental status, tachypnea and hyperglycemia.  Was discharged 4 days ago after admission for DKA. Patient is poor historian due to respiratory status and somnolence. Protecting airway. Patient's sister and daughter arrived to bedside. Pt's daughter states she gave pt's novolog (5U) and lantus (10U) yesterday evening. Pt's glucose was over 500 yesterday night. Typically runs in 300-400 range. Pt's sister states difficulty caring for patient as she has to care for elderly mother age 45 and another family member with down syndrome.  The history is provided by medical records, the EMS personnel and a relative. No language interpreter was used.    Past Medical History:  Diagnosis Date  . 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 Closed comminuted intertrochanteric fracture of left femur ([place])   . Diabetes mellitus without complication ([place])   . Diabetic hyperosmolar non-ketotic state ([place]) 05/15/2016  . DKA (diabetic ketoacidoses) ([place]) 05/09/2016  . Hypertension   . Postoperative anemia due to acute blood loss 06/18/2016  . Retroperitoneal hematoma 06/18/2016  . Stroke ([place])   . Vitamin B 12 deficiency 06/18/2016    Patient Active Problem List   Diagnosis Date Noted  . Respiratory failure ([place])   . DKA, type 2 ([place]) 08/01/2017  . History of cerebrovascular accident (CVA) in adulthood 07/25/2017  . HTN (hypertension) 07/25/2017  . Depression with anxiety 07/25/2017  . Left ventricular diastolic dysfunction 22/97/9892  . Dementia 07/25/2017  . Acute kidney injury ([place]) 07/25/2017  .  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 Hypoglycemia due to insulin 06/27/2017  . CKD (chronic kidney disease), stage III ([place]) 06/27/2017  . Diabetes mellitus, insulin dependent (IDDM), controlled ([place]) 06/27/2017  . History of CVA in adulthood 06/27/2017  . Hypertension 06/27/2017  . Hypothermia 06/27/2017  . Lactic acid increased 06/27/2017  . DKA (diabetic ketoacidoses) ([place]) 05/03/2017  . DKA (diabetic ketoacidosis) ([place]) 03/22/2017  . Elbow pain, right 03/22/2017  . Abdominal pain due to injury 03/22/2017  . Rib pain on left side 01/29/2017  . Dehydration   . Urinary tract infection without hematuria   . Hyperkalemia 01/16/2017  . Acute metabolic encephalopathy 11/94/1740  . Elevated lactic acid level   . AKI (acute kidney injury) ([place]) 12/14/2016  . Hypoglycemia   . Sepsis ([place]) 12/13/2016  . Uncontrolled type 2 diabetes mellitus with complication ([place])   . Acute renal failure superimposed on stage 3 chronic kidney disease ([place])   . Noncompliance with medication regimen 08/17/2016  . History of DVT (deep vein thrombosis) 07/06/2016  . Retroperitoneal hematoma 06/18/2016  . Vitamin B 12 deficiency 06/18/2016  . Hyperglycemia 05/15/2016  . Anxiety 05/15/2016  . History of CVA (cerebrovascular accident) 05/15/2016  . Hypoglycemia, coma ([place]) 11/02/2015  . Narcotic dependency, continuous ([place]) 11/02/2015  . Uncontrolled type 2 diabetes mellitus with diabetic nephropathy, with long-term current use of insulin ([place]) 08/12/2015  . Depression 08/12/2015  . CKD (chronic kidney disease) stage 3, GFR 30-59 ml/min ([name]) 03/20/2014    Past Surgical History:  Procedure Laterality Date  . HIP ARTHROPLASTY Right 02/05/2013   Procedure: ARTHROPLASTY BIPOLAR HIP;  Surgeon: Kipp Brood  Gioffre, MD;  Location: [facility];  Service: Orthopedics;  Laterality: Right;  . INTRAMEDULLARY (IM) NAIL INTERTROCHANTERIC Left 06/16/2016   Procedure: INTRAMEDULLARY (IM) NAIL INTERTROCHANTRIC;  Surgeon:  Rod Can, MD;  Location: [facility];  Service: Orthopedics;  Laterality: Left;       Home Medications    Prior to Admission medications   Medication Sig Start Date End Date Taking? Authorizing Provider  amLODipine (NORVASC) 10 MG tablet TAKE 1 TABLET ONCE DAILY. 07/20/17  Yes [name], [name]  aspirin 81 MG EC tablet Take 1 tablet (81 mg total) by mouth daily. 01/29/17  Yes [name], [name]  FLUoxetine (PROZAC) 40 MG capsule Take 1 capsule (40 mg total) by mouth daily. 01/30/17  Yes [name], [name]  glucagon (GLUCAGON EMERGENCY) 1 MG injection Inject 1 mg into the vein once as needed. 06/30/17  Yes [name], [name], DO  insulin aspart (NOVOLOG) 100 UNIT/ML FlexPen Inject 3 Units into the skin 3 (three) times daily with meals. For blood sugars greater than 150 only 06/30/17  Yes [name], [name], DO  Insulin Glargine (LANTUS SOLOSTAR) 100 UNIT/ML Solostar Pen Inject 10 Units into the skin daily at 10 pm. 06/30/17  Yes [name], [name], DO  blood glucose meter kit and supplies Dispense based on patient and insurance preference. Use up to four times daily as directed. (FOR ICD-9 250.00, 250.01). Patient not taking: Reported on 07/25/2017 06/30/17   [name], DO  feeding supplement, GLUCERNA SHAKE, (GLUCERNA SHAKE) LIQD Take 237 mLs by mouth 2 (two) times daily between meals. Patient not taking: Reported on 08/01/2017 06/30/17   [name], DO  Insulin Pen Needle 29G X 10MM MISC To attached to insulin pen and administer medication as prescribed. Patient not taking: Reported on 08/01/2017 06/30/17   [name], DO    Family History Family History  Problem Relation Age of Onset  . Diabetes Mother   . Alzheimer's disease Mother   . Hypertension Mother   . Hyperlipidemia Father   . Hypertension Father   . Healthy Maternal Grandmother   . Pneumonia Maternal Grandfather     Social History Social History  Substance Use Topics  . Smoking  status: Current Some Day Smoker    Packs/day: 0.25    Years: 30.00    Types: Cigarettes    Last attempt to quit: 01/31/2013  . Smokeless tobacco: Never Used     Comment: "some days I light one", requests a nicotine patch  . Alcohol use No     Allergies   Patient has no known allergies.   Review of Systems Review of Systems  Unable to perform ROS: Mental status change     Physical Exam Updated Vital Signs BP (!) 97/50   Pulse 65   Temp 98.3 F (36.8 C) (Axillary)   Resp 14   Wt 67.2 kg (148 lb 2.4 oz)   SpO2 100%   BMI 23.91 kg/m   Physical Exam  Constitutional: He appears well-developed. He appears listless. He has a sickly appearance. He appears ill.  HENT:  Head: Normocephalic and atraumatic.  Mouth/Throat: Mucous membranes are dry.  Neck: Neck supple.  Cardiovascular: Normal heart sounds.  Tachycardia present.   Pulmonary/Chest: Accessory muscle usage present. Tachypnea noted. He has no wheezes. He has no rales.  Abdominal: Soft. He exhibits no distension. There is no tenderness.  Musculoskeletal: He exhibits no edema or deformity.  Neurological: He appears listless.     ED Treatments /  Results  Labs (all labs ordered are listed, but only abnormal results are displayed) Labs Reviewed  BASIC METABOLIC PANEL - Abnormal; Notable for the following:       Result Value   Sodium 131 (*)    Potassium 7.4 (*)    Chloride 92 (*)    CO2 <7 (*)    Glucose, Bld 1,139 (*)    BUN 49 (*)    Creatinine, Ser 4.30 (*)    GFR calc non Af Amer 13 (*)    GFR calc Af Amer 15 (*)    All other components within normal limits  CBC WITH DIFFERENTIAL/PLATELET - Abnormal; Notable for the following:    WBC 20.3 (*)    RBC 2.88 (*)    Hemoglobin 8.7 (*)    HCT 33.1 (*)    MCV 114.9 (*)    MCHC 26.3 (*)    Neutro Abs 15.5 (*)    Monocytes Absolute 2.0 (*)    All other components within normal limits  URINALYSIS, ROUTINE W REFLEX MICROSCOPIC - Abnormal; Notable for the  following:    APPearance HAZY (*)    Glucose, UA >=500 (*)    Hgb urine dipstick MODERATE (*)    Ketones, ur 20 (*)    Protein, ur 100 (*)    Bacteria, UA RARE (*)    Squamous Epithelial / LPF 0-5 (*)    All other components within normal limits  COMPREHENSIVE METABOLIC PANEL - Abnormal; Notable for the following:    Sodium 131 (*)    Potassium >7.5 (*)    Chloride 93 (*)    CO2 <7 (*)    Glucose, Bld 1,169 (*)    BUN 50 (*)    Creatinine, Ser 4.48 (*)    Calcium 8.8 (*)    Total Protein 6.3 (*)    Albumin 3.3 (*)    Total Bilirubin 1.5 (*)    GFR calc non Af Amer 12 (*)    GFR calc Af Amer 14 (*)    All other components within normal limits  BASIC METABOLIC PANEL - Abnormal; Notable for the following:    Chloride 100 (*)    CO2 8 (*)    Glucose, Bld 924 (*)    BUN 45 (*)    Creatinine, Ser 4.22 (*)    Calcium 7.7 (*)    GFR calc non Af Amer 13 (*)    GFR calc Af Amer 15 (*)    Anion gap 31 (*)    All other components within normal limits  BASIC METABOLIC PANEL - Abnormal; Notable for the following:    Potassium 3.1 (*)    Chloride 98 (*)    CO2 15 (*)    Glucose, Bld 743 (*)    BUN 44 (*)    Creatinine, Ser 4.35 (*)    Calcium 7.5 (*)    GFR calc non Af Amer 12 (*)    GFR calc Af Amer 14 (*)    Anion gap 26 (*)    All other components within normal limits  BASIC METABOLIC PANEL - Abnormal; Notable for the following:    Potassium 2.9 (*)    CO2 19 (*)    Glucose, Bld 399 (*)    BUN 44 (*)    Creatinine, Ser 4.34 (*)    Calcium 7.9 (*)    GFR calc non Af Amer 12 (*)    GFR calc Af Amer 14 (*)    Anion gap 21 (*)  All other components within normal limits  PHOSPHORUS - Abnormal; Notable for the following:    Phosphorus 7.3 (*)    All other components within normal limits  CBC - Abnormal; Notable for the following:    WBC 10.9 (*)    RBC 2.43 (*)    Hemoglobin 7.3 (*)    HCT 21.7 (*)    All other components within normal limits  BLOOD GAS, ARTERIAL -  Abnormal; Notable for the following:    pCO2 arterial 22.5 (*)    pO2, Arterial 442 (*)    Bicarbonate 13.9 (*)    Acid-base deficit 9.9 (*)    All other components within normal limits  LACTIC ACID, PLASMA - Abnormal; Notable for the following:    Lactic Acid, Venous 6.9 (*)    All other components within normal limits  GLUCOSE, CAPILLARY - Abnormal; Notable for the following:    Glucose-Capillary >600 (*)    All other components within normal limits  BASIC METABOLIC PANEL - Abnormal; Notable for the following:    Potassium 2.7 (*)    Glucose, Bld 196 (*)    BUN 43 (*)    Creatinine, Ser 4.33 (*)    Calcium 7.9 (*)    GFR calc non Af Amer 12 (*)    GFR calc Af Amer 14 (*)    All other components within normal limits  LACTIC ACID, PLASMA - Abnormal; Notable for the following:    Lactic Acid, Venous 6.4 (*)    All other components within normal limits  GLUCOSE, CAPILLARY - Abnormal; Notable for the following:    Glucose-Capillary >600 (*)    All other components within normal limits  GLUCOSE, CAPILLARY - Abnormal; Notable for the following:    Glucose-Capillary >600 (*)    All other components within normal limits  GLUCOSE, CAPILLARY - Abnormal; Notable for the following:    Glucose-Capillary >600 (*)    All other components within normal limits  GLUCOSE, CAPILLARY - Abnormal; Notable for the following:    Glucose-Capillary >600 (*)    All other components within normal limits  GLUCOSE, CAPILLARY - Abnormal; Notable for the following:    Glucose-Capillary 514 (*)    All other components within normal limits  GLUCOSE, CAPILLARY - Abnormal; Notable for the following:    Glucose-Capillary 519 (*)    All other components within normal limits  GLUCOSE, CAPILLARY - Abnormal; Notable for the following:    Glucose-Capillary 438 (*)    All other components within normal limits  GLUCOSE, CAPILLARY - Abnormal; Notable for the following:    Glucose-Capillary 384 (*)    All other  components within normal limits  GLUCOSE, CAPILLARY - Abnormal; Notable for the following:    Glucose-Capillary 300 (*)    All other components within normal limits  BLOOD GAS, ARTERIAL - Abnormal; Notable for the following:    pH, Arterial 7.586 (*)    pCO2 arterial 26.2 (*)    pO2, Arterial 31.7 (*)    Acid-Base Excess 3.0 (*)    All other components within normal limits  PHOSPHORUS - Abnormal; Notable for the following:    Phosphorus 1.1 (*)    All other components within normal limits  LACTIC ACID, PLASMA - Abnormal; Notable for the following:    Lactic Acid, Venous 7.2 (*)    All other components within normal limits  LACTIC ACID, PLASMA - Abnormal; Notable for the following:    Lactic Acid, Venous 2.1 (*)  All other components within normal limits  MAGNESIUM - Abnormal; Notable for the following:    Magnesium 1.6 (*)    All other components within normal limits  MAGNESIUM - Abnormal; Notable for the following:    Magnesium 2.5 (*)    All other components within normal limits  PHOSPHORUS - Abnormal; Notable for the following:    Phosphorus 1.6 (*)    All other components within normal limits  PHOSPHORUS - Abnormal; Notable for the following:    Phosphorus 1.9 (*)    All other components within normal limits  GLUCOSE, CAPILLARY - Abnormal; Notable for the following:    Glucose-Capillary 134 (*)    All other components within normal limits  GLUCOSE, CAPILLARY - Abnormal; Notable for the following:    Glucose-Capillary 249 (*)    All other components within normal limits  GLUCOSE, CAPILLARY - Abnormal; Notable for the following:    Glucose-Capillary 188 (*)    All other components within normal limits  GLUCOSE, CAPILLARY - Abnormal; Notable for the following:    Glucose-Capillary 149 (*)    All other components within normal limits  GLUCOSE, CAPILLARY - Abnormal; Notable for the following:    Glucose-Capillary 118 (*)    All other components within normal limits    GLUCOSE, CAPILLARY - Abnormal; Notable for the following:    Glucose-Capillary 144 (*)    All other components within normal limits  GLUCOSE, CAPILLARY - Abnormal; Notable for the following:    Glucose-Capillary 131 (*)    All other components within normal limits  GLUCOSE, CAPILLARY - Abnormal; Notable for the following:    Glucose-Capillary 130 (*)    All other components within normal limits  GLUCOSE, CAPILLARY - Abnormal; Notable for the following:    Glucose-Capillary 188 (*)    All other components within normal limits  CBG MONITORING, ED - Abnormal; Notable for the following:    Glucose-Capillary >600 (*)    All other components within normal limits  I-STAT CHEM 8, ED - Abnormal; Notable for the following:    Sodium 130 (*)    Potassium 7.8 (*)    BUN 56 (*)    Creatinine, Ser 3.60 (*)    Glucose, Bld >700 (*)    TCO2 6 (*)    Hemoglobin 10.2 (*)    HCT 30.0 (*)    All other components within normal limits  I-STAT ARTERIAL BLOOD GAS, ED - Abnormal; Notable for the following:    pH, Arterial 6.860 (*)    pCO2 arterial 15.0 (*)    pO2, Arterial 281.0 (*)    Bicarbonate 2.8 (*)    TCO2 <5 (*)    Acid-base deficit 30.0 (*)    All other components within normal limits  I-STAT CG4 LACTIC ACID, ED - Abnormal; Notable for the following:    Lactic Acid, Venous 7.94 (*)    All other components within normal limits  CBG MONITORING, ED - Abnormal; Notable for the following:    Glucose-Capillary >600 (*)    All other components within normal limits  CBG MONITORING, ED - Abnormal; Notable for the following:    Glucose-Capillary >600 (*)    All other components within normal limits  CBG MONITORING, ED - Abnormal; Notable for the following:    Glucose-Capillary >600 (*)    All other components within normal limits  POCT I-STAT 3, ART BLOOD GAS (G3+) - Abnormal; Notable for the following:    pH, Arterial 7.001 (*)  pCO2 arterial 23.9 (*)    pO2, Arterial 186.0 (*)     Bicarbonate 5.9 (*)    TCO2 7 (*)    Acid-base deficit 24.0 (*)    All other components within normal limits  CULTURE, BLOOD (ROUTINE X 2)  CULTURE, BLOOD (ROUTINE X 2)  MRSA PCR SCREENING  CULTURE, RESPIRATORY (NON-EXPECTORATED)  URINE CULTURE  MAGNESIUM  TROPONIN I  PROCALCITONIN  PROCALCITONIN  MAGNESIUM  GLUCOSE, CAPILLARY  BASIC METABOLIC PANEL  BASIC METABOLIC PANEL  LACTIC ACID, PLASMA  BASIC METABOLIC PANEL  PROCALCITONIN  CBC WITH DIFFERENTIAL/PLATELET  MAGNESIUM  PHOSPHORUS  CBC  BASIC METABOLIC PANEL  BLOOD GAS, ARTERIAL  BASIC METABOLIC PANEL  I-STAT CG4 LACTIC ACID, ED    EKG  EKG Interpretation  Date/Time:  Saturday August 01 2017 14:28:19 EDT Ventricular Rate:  99 PR Interval:    QRS Duration: 126 QT Interval:  378 QTC Calculation: 486 R Axis:   -86 Text Interpretation:  Ectopic atrial rhythm Nonspecific IVCD with LAD Probable anteroseptal infarct, old Since last EKG, T waves significantly peaked and QRS widened Confirmed by Duffy Bruce [phone]) on 08/01/2017 4:03:48 PM       Radiology Portable Chest X-ray (1 View)  Result Date: 08/02/2017 CLINICAL DATA:  Respiratory failure. EXAM: PORTABLE CHEST 1 VIEW COMPARISON:  08/01/2017. FINDINGS: Stable and unchanged cardiomediastinal silhouette. ET tube has been pulled back, now 4.1 cm above carina, satisfactory position. Center venous catheter tip lies within the RIGHT atrium, approximately 4 cm below the cavoatrial junction. Rotated radiograph demonstrates mild vascular congestion without consolidation or edema. No effusion or pneumothorax. Slight worsening aeration compared with priors. IMPRESSION: ET tube 4 cm above carina. Central venous catheter tip lies well within the RIGHT atrium. Mild vascular congestion is developing. Electronically Signed   By: Staci Righter M.D.   On: 08/02/2017 07:27   Dg Chest Portable 1 View  Result Date: 08/01/2017 CLINICAL DATA:  Status post intubation EXAM: PORTABLE  CHEST 1 VIEW COMPARISON:  08/01/2017 FINDINGS: Cardiac shadow is stable. Right jugular central line is noted at the cavoatrial junction. Endotracheal tube is seen in the distal trachea approximately 1.7 cm above the carina. The lungs are well aerated bilaterally. No acute bony abnormality is seen. No focal infiltrate is noted. IMPRESSION: Tubes and lines as described.  No change from the prior exam. Electronically Signed   By: Inez Catalina M.D.   On: 08/01/2017 18:55   Dg Chest Port 1 View  Result Date: 08/01/2017 CLINICAL DATA:  Altered level of consciousness, increased respiratory rate and hypothermia. EXAM: PORTABLE CHEST 1 VIEW COMPARISON:  07/25/2017 and prior exams FINDINGS: The cardiomediastinal silhouette is unremarkable. There is no evidence of focal airspace disease, pulmonary edema, suspicious pulmonary nodule/mass, pleural effusion, or pneumothorax. No acute bony abnormalities are identified. IMPRESSION: No active disease. Electronically Signed   By: Margarette Canada M.D.   On: 08/01/2017 15:55   Dg Abd Portable 1v  Result Date: 08/02/2017 CLINICAL DATA:  Encounter for nasogastric tube placement. EXAM: PORTABLE ABDOMEN - 1 VIEW COMPARISON:  None. FINDINGS: Nasogastric tube has been placed and overlies the LEFT upper quadrant. Presumably this lies within the stomach, with the tip directed toward the fundus. Visualized bowel gas pattern appears nonobstructive. IMPRESSION: Nasogastric tube tip is directed upward into the LEFT upper quadrant, likely into the fundus of the stomach. Electronically Signed   By: Staci Righter M.D.   On: 08/02/2017 14:51    Procedures .Critical Care Performed by: Payton Emerald Authorized by: Kattie Santoyo,  [place] care provider statement:    Critical care time (minutes):  40   Critical care time was exclusive of:  Teaching time and separately billable procedures and treating other patients   Critical care was necessary to treat or prevent imminent or life-threatening  deterioration of the following conditions:  Endocrine crisis and shock   Critical care was time spent personally by me on the following activities:  Ordering and performing treatments and interventions, development of treatment plan with patient or surrogate, discussions with consultants, ordering and review of laboratory studies, ordering and review of radiographic studies, pulse oximetry, evaluation of patient's response to treatment, re-evaluation of patient's condition, examination of patient, review of old charts and ventilator management   I assumed direction of critical care for this patient from another provider in my specialty: no     .Central Line Date/Time: 08/01/2017 4:30 PM Performed by: Payton Emerald Authorized by: Payton Emerald   Consent:    Consent obtained:  Written and verbal   Consent given by: Adult daughter.   Risks discussed:  Arterial puncture, bleeding, infection, incorrect placement, nerve damage and pneumothorax   Alternatives discussed:  No treatment Pre-procedure details:    Hand hygiene: Hand hygiene performed prior to insertion     Sterile barrier technique: All elements of maximal sterile technique followed     Skin preparation:  ChloraPrep   Skin preparation agent: Skin preparation agent completely dried prior to procedure   Procedure details:    Location:  R internal jugular   Patient position:  Trendelenburg   Procedural supplies:  Triple lumen   Landmarks identified: yes     Ultrasound guidance: yes     Sterile ultrasound techniques: Sterile gel and sterile probe covers were used     Number of attempts:  1   Successful placement: yes   Post-procedure details:    Post-procedure:  Dressing applied and line sutured   Assessment:  Blood return through all ports, free fluid flow, placement verified by x-ray and no pneumothorax on x-ray   Patient tolerance of procedure:  Tolerated well, no immediate complications   (including critical care time)  18g IV placed  in R brachial vein under US guidance.  Medications Ordered in ED Medications  insulin regular (NOVOLIN R,HUMULIN R) 100 Units in sodium chloride 0.9 % 100 mL (1 Units/mL) infusion (0.6 Units/hr Intravenous Rate/Dose Change 08/02/17 1701)  vancomycin (VANCOCIN) IVPB 1000 mg/200 mL premix (not administered)  piperacillin-tazobactam (ZOSYN) IVPB 2.25 g (0 g Intravenous Stopped 08/02/17 1722)  0.9 %  sodium chloride infusion (not administered)  0.9 %  sodium chloride infusion ( Intravenous Stopped 08/02/17 0814)  dextrose 5 %-0.45 % sodium chloride infusion ( Intravenous New Bag/Given 08/02/17 1744)  heparin injection 5,000 Units (5,000 Units Subcutaneous Given 08/02/17 1432)  chlorhexidine gluconate (MEDLINE KIT) (PERIDEX) 0.12 % solution 15 mL (15 mLs Mouth Rinse Given 08/02/17 0857)  MEDLINE mouth rinse (15 mLs Mouth Rinse Given 08/02/17 1615)  etomidate (AMIDATE) 2 MG/ML injection (not administered)  sodium bicarbonate 1 mEq/mL injection (not administered)  fentaNYL 2560mg in NS 2558m(1024mml) infusion-PREMIX (250 mcg/hr Intravenous Rate/Dose Verify 08/02/17 1700)  fentaNYL (SUBLIMAZE) bolus via infusion 25 mcg (25 mcg Intravenous Bolus from Bag 08/02/17 1207)  midazolam (VERSED) injection 2 mg (not administered)  midazolam (VERSED) injection 2 mg (not administered)  fentaNYL (SUBLIMAZE) injection 50 mcg (not administered)  fentaNYL (SUBLIMAZE) 100 MCG/2ML injection (not administered)  fentaNYL (SUBLIMAZE) injection 50 mcg (not administered)  famotidine (PEPCID) IVPB  20 mg premix (0 mg Intravenous Stopped 08/02/17 1130)  sodium bicarbonate injection (50 mEq Intravenous Given 08/01/17 1818)  etomidate (AMIDATE) injection (20 mg Intravenous Given 08/01/17 1820)  norepinephrine (LEVOPHED) 4 mg in dextrose 5 % 250 mL (0.016 mg/mL) infusion (0 mcg/min Intravenous Stopped 08/02/17 1430)  feeding supplement (VITAL HIGH PROTEIN) liquid 1,000 mL ( Per Tube Given 08/02/17 1700)  feeding  supplement (PRO-STAT SUGAR FREE 64) liquid 30 mL (30 mLs Per Tube Given 08/02/17 1547)  potassium PHOSPHATE 30 mmol in dextrose 5 % 500 mL infusion (30 mmol Intravenous New Bag/Given 08/02/17 1640)  Chlorhexidine Gluconate Cloth 2 % PADS 6 each (not administered)  lactated ringers bolus 1,000 mL (0 mLs Intravenous Stopped 08/01/17 1736)  sodium chloride 0.9 % bolus 1,000 mL (0 mLs Intravenous Stopped 08/01/17 1720)  piperacillin-tazobactam (ZOSYN) IVPB 3.375 g (0 g Intravenous Stopped 08/01/17 1829)  vancomycin (VANCOCIN) IVPB 1000 mg/200 mL premix (0 mg Intravenous Stopped 08/01/17 1929)  sodium bicarbonate injection 50 mEq (50 mEq Intravenous Given 08/01/17 1623)  calcium gluconate inj 10% (1 g) URGENT USE ONLY! (1 g Intravenous Given 08/01/17 1647)  albuterol (PROVENTIL,VENTOLIN) solution continuous neb (10 mg/hr Nebulization Given by Other 08/01/17 1632)  sodium chloride 0.9 % bolus 1,000 mL (1,000 mLs Intravenous New Bag/Given 08/01/17 1722)  sodium chloride 0.9 % bolus 1,000 mL (1,000 mLs Intravenous New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

## 2017-08-01 NOTE — Progress Notes (Signed)
Panic ABG results given to ED attending MD, no new RT orders received.

## 2017-08-01 NOTE — ED Notes (Signed)
Consent signed by daughter for [employer] after procedure explained to her

## 2017-08-01 NOTE — ED Notes (Signed)
Pt remains unresponsive with snoring resps.  02 Sats remain 100% on NRB mask
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
## 2017-08-02 ENCOUNTER — Inpatient Hospital Stay (HOSPITAL_COMMUNITY): Payer: Medicare Other

## 2017-08-02 DIAGNOSIS — R579 Shock, unspecified: Secondary | ICD-10-CM

## 2017-08-02 DIAGNOSIS — G9340 Encephalopathy, unspecified: Secondary | ICD-10-CM

## 2017-08-02 DIAGNOSIS — J9690 Respiratory failure, unspecified, unspecified whether with hypoxia or hypercapnia: Secondary | ICD-10-CM

## 2017-08-02 LAB — BASIC METABOLIC PANEL
Anion gap: 26 — ABNORMAL HIGH (ref 5–15)
Anion gap: 21 — ABNORMAL HIGH (ref 5–15)
Anion gap: 13 (ref 5–15)
Anion gap: 6 (ref 5–15)
BUN: 44 mg/dL — ABNORMAL HIGH (ref 6–20)
BUN: 44 mg/dL — ABNORMAL HIGH (ref 6–20)
BUN: 43 mg/dL — ABNORMAL HIGH (ref 6–20)
BUN: 43 mg/dL — ABNORMAL HIGH (ref 6–20)
CO2: 15 mmol/L — ABNORMAL LOW (ref 22–32)
CO2: 19 mmol/L — ABNORMAL LOW (ref 22–32)
CO2: 24 mmol/L (ref 22–32)
CO2: 26 mmol/L (ref 22–32)
Calcium: 7.5 mg/dL — ABNORMAL LOW (ref 8.9–10.3)
Calcium: 7.9 mg/dL — ABNORMAL LOW (ref 8.9–10.3)
Calcium: 7.9 mg/dL — ABNORMAL LOW (ref 8.9–10.3)
Calcium: 7 mg/dL — ABNORMAL LOW (ref 8.9–10.3)
Chloride: 98 mmol/L — ABNORMAL LOW (ref 101–111)
Chloride: 104 mmol/L (ref 101–111)
Chloride: 107 mmol/L (ref 101–111)
Chloride: 108 mmol/L (ref 101–111)
Creatinine, Ser: 4.35 mg/dL — ABNORMAL HIGH (ref 0.61–1.24)
Creatinine, Ser: 4.34 mg/dL — ABNORMAL HIGH (ref 0.61–1.24)
Creatinine, Ser: 4.33 mg/dL — ABNORMAL HIGH (ref 0.61–1.24)
Creatinine, Ser: 4.52 mg/dL — ABNORMAL HIGH (ref 0.61–1.24)
GFR calc Af Amer: 14 mL/min — ABNORMAL LOW (ref >60)
GFR calc Af Amer: 14 mL/min — ABNORMAL LOW (ref >60)
GFR calc Af Amer: 14 mL/min — ABNORMAL LOW (ref >60)
GFR calc Af Amer: 14 mL/min — ABNORMAL LOW (ref >60)
GFR calc non Af Amer: 12 mL/min — ABNORMAL LOW (ref >60)
GFR calc non Af Amer: 12 mL/min — ABNORMAL LOW (ref >60)
GFR calc non Af Amer: 12 mL/min — ABNORMAL LOW (ref >60)
GFR calc non Af Amer: 12 mL/min — ABNORMAL LOW (ref >60)
Glucose, Bld: 743 mg/dL (ref 65–99)
Glucose, Bld: 399 mg/dL — ABNORMAL HIGH (ref 65–99)
Glucose, Bld: 196 mg/dL — ABNORMAL HIGH (ref 65–99)
Glucose, Bld: 150 mg/dL — ABNORMAL HIGH (ref 65–99)
Potassium: 3.1 mmol/L — ABNORMAL LOW (ref 3.5–5.1)
Potassium: 2.9 mmol/L — ABNORMAL LOW (ref 3.5–5.1)
Potassium: 2.7 mmol/L — CL (ref 3.5–5.1)
Potassium: 3.7 mmol/L (ref 3.5–5.1)
Sodium: 139 mmol/L (ref 135–145)
Sodium: 144 mmol/L (ref 135–145)
Sodium: 144 mmol/L (ref 135–145)
Sodium: 140 mmol/L (ref 135–145)

## 2017-08-02 LAB — GLUCOSE, CAPILLARY
Glucose-Capillary: 118 mg/dL — ABNORMAL HIGH (ref 65–99)
Glucose-Capillary: 130 mg/dL — ABNORMAL HIGH (ref 65–99)
Glucose-Capillary: 131 mg/dL — ABNORMAL HIGH (ref 65–99)
Glucose-Capillary: 134 mg/dL — ABNORMAL HIGH (ref 65–99)
Glucose-Capillary: 143 mg/dL — ABNORMAL HIGH (ref 65–99)
Glucose-Capillary: 144 mg/dL — ABNORMAL HIGH (ref 65–99)
Glucose-Capillary: 149 mg/dL — ABNORMAL HIGH (ref 65–99)
Glucose-Capillary: 153 mg/dL — ABNORMAL HIGH (ref 65–99)
Glucose-Capillary: 183 mg/dL — ABNORMAL HIGH (ref 65–99)
Glucose-Capillary: 188 mg/dL — ABNORMAL HIGH (ref 65–99)
Glucose-Capillary: 188 mg/dL — ABNORMAL HIGH (ref 65–99)
Glucose-Capillary: 208 mg/dL — ABNORMAL HIGH (ref 65–99)
Glucose-Capillary: 216 mg/dL — ABNORMAL HIGH (ref 65–99)
Glucose-Capillary: 224 mg/dL — ABNORMAL HIGH (ref 65–99)
Glucose-Capillary: 249 mg/dL — ABNORMAL HIGH (ref 65–99)
Glucose-Capillary: 300 mg/dL — ABNORMAL HIGH (ref 65–99)
Glucose-Capillary: 384 mg/dL — ABNORMAL HIGH (ref 65–99)
Glucose-Capillary: 438 mg/dL — ABNORMAL HIGH (ref 65–99)
Glucose-Capillary: 514 mg/dL (ref 65–99)
Glucose-Capillary: 519 mg/dL (ref 65–99)
Glucose-Capillary: 95 mg/dL (ref 65–99)
Glucose-Capillary: >600 mg/dL (ref 65–99)

## 2017-08-02 LAB — CBC
HCT: 21.7 % — ABNORMAL LOW (ref 39.0–52.0)
Hemoglobin: 7.3 g/dL — ABNORMAL LOW (ref 13.0–17.0)
MCH: 30 pg (ref 26.0–34.0)
MCHC: 33.6 g/dL (ref 30.0–36.0)
MCV: 89.3 fL (ref 78.0–100.0)
Platelets: 198 10*3/uL (ref 150–400)
RBC: 2.43 MIL/uL — ABNORMAL LOW (ref 4.22–5.81)
RDW: 13.9 % (ref 11.5–15.5)
WBC: 10.9 10*3/uL — ABNORMAL HIGH (ref 4.0–10.5)

## 2017-08-02 LAB — URINALYSIS, ROUTINE W REFLEX MICROSCOPIC
Bilirubin Urine: NEGATIVE
Glucose, UA: >=500 mg/dL — AB
Ketones, ur: 20 mg/dL — AB
Leukocytes, UA: NEGATIVE
Nitrite: NEGATIVE
Protein, ur: 100 mg/dL — AB
Specific Gravity, Urine: 1.014 (ref 1.005–1.030)
pH: 5 (ref 5.0–8.0)

## 2017-08-02 LAB — BLOOD GAS, ARTERIAL
Acid-base deficit: 9.9 mmol/L — ABNORMAL HIGH (ref 0.0–2.0)
Bicarbonate: 13.9 mmol/L — ABNORMAL LOW (ref 20.0–28.0)
Drawn by: 39595
FIO2: 100
MECHVT: 510 mL
O2 Saturation: 99.7 %
PEEP: 5 cmH2O
Patient temperature: 98.6
RATE: 32 resp/min
pCO2 arterial: 22.5 mmHg — ABNORMAL LOW (ref 32.0–48.0)
pH, Arterial: 7.407 (ref 7.350–7.450)
pO2, Arterial: 442 mmHg — ABNORMAL HIGH (ref 83.0–108.0)

## 2017-08-02 LAB — PHOSPHORUS
Phosphorus: 1.1 mg/dL — ABNORMAL LOW (ref 2.5–4.6)
Phosphorus: 1.6 mg/dL — ABNORMAL LOW (ref 2.5–4.6)
Phosphorus: 1.9 mg/dL — ABNORMAL LOW (ref 2.5–4.6)

## 2017-08-02 LAB — LACTIC ACID, PLASMA
Lactic Acid, Venous: 6.4 mmol/L (ref 0.5–1.9)
Lactic Acid, Venous: 7.2 mmol/L (ref 0.5–1.9)
Lactic Acid, Venous: 2.1 mmol/L (ref 0.5–1.9)
Lactic Acid, Venous: 1.5 mmol/L (ref 0.5–1.9)

## 2017-08-02 LAB — MAGNESIUM
Magnesium: 1.7 mg/dL (ref 1.7–2.4)
Magnesium: 1.6 mg/dL — ABNORMAL LOW (ref 1.7–2.4)
Magnesium: 2.5 mg/dL — ABNORMAL HIGH (ref 1.7–2.4)

## 2017-08-02 LAB — PROCALCITONIN: Procalcitonin: 8.78 ng/mL

## 2017-08-02 MED ORDER — CHLORHEXIDINE GLUCONATE CLOTH 2 % EX PADS
6.0000 | MEDICATED_PAD | Freq: Every day | CUTANEOUS | Status: DC
  Administered 2017-08-03 – 2017-08-10 (×8): 6 via TOPICAL

## 2017-08-02 MED ORDER — MAGNESIUM SULFATE 2 GM/50ML IV SOLN
2.0000 g | Freq: Once | INTRAVENOUS | Status: AC
  Administered 2017-08-02: 2 g via INTRAVENOUS
  Filled 2017-08-02: qty 50

## 2017-08-02 MED ORDER — PRO-STAT SUGAR FREE PO LIQD
30.0000 mL | Freq: Two times a day (BID) | ORAL | Status: DC
  Administered 2017-08-02 – 2017-08-03 (×3): 30 mL
  Filled 2017-08-02 (×3): qty 30

## 2017-08-02 MED ORDER — POTASSIUM CHLORIDE 10 MEQ/100ML IV SOLN
10.0000 meq | INTRAVENOUS | Status: AC
  Administered 2017-08-02 (×4): 10 meq via INTRAVENOUS
  Filled 2017-08-02 (×5): qty 100

## 2017-08-02 MED ORDER — SODIUM CHLORIDE 0.9 % IV BOLUS (SEPSIS)
500.0000 mL | Freq: Once | INTRAVENOUS | Status: AC
  Administered 2017-08-02: 500 mL via INTRAVENOUS

## 2017-08-02 MED ORDER — VITAL HIGH PROTEIN PO LIQD
1000.0000 mL | ORAL | Status: DC
  Administered 2017-08-02 (×2)
  Administered 2017-08-02: 1000 mL
  Administered 2017-08-03 (×3)

## 2017-08-02 MED ORDER — CHLORHEXIDINE GLUCONATE 0.12 % MT SOLN
OROMUCOSAL | Status: AC
  Administered 2017-08-02: 15 mL
  Filled 2017-08-02: qty 15

## 2017-08-02 MED ORDER — POTASSIUM CHLORIDE 20 MEQ/15ML (10%) PO SOLN: 40.0000 meq | Freq: Once | ORAL | Status: DC

## 2017-08-02 MED ORDER — POTASSIUM PHOSPHATES 15 MMOLE/5ML IV SOLN
30.0000 mmol | Freq: Once | INTRAVENOUS | Status: AC
  Administered 2017-08-02: 30 mmol via INTRAVENOUS
  Filled 2017-08-02: qty 10

## 2017-08-02 MED ORDER — POTASSIUM CHLORIDE 20 MEQ/15ML (10%) PO SOLN: 40.0000 meq | Freq: Three times a day (TID) | ORAL | Status: DC

## 2017-08-02 MED ORDER — POTASSIUM CHLORIDE 20 MEQ/15ML (10%) PO SOLN
40.0000 meq | Freq: Once | ORAL | Status: AC
  Administered 2017-08-02: 40 meq
  Filled 2017-08-02: qty 30

## 2017-08-02 NOTE — Progress Notes (Signed)
CRITICAL VALUE ALERT  Critical Value:  Lactic Acid 7.2  Date & Time Notied:  08/02/17 1034  Provider Notified: Dr Nelda Marseille  Orders Received/Actions taken: notified

## 2017-08-02 NOTE — Progress Notes (Signed)
Brief Nutrition Note  Consult received for enteral/tube feeding initiation and management.  Adult Enteral Nutrition Protocol initiated. Full assessment to follow.  Admitting Dx: Respiratory failure ([place]) [J96.90]  There is no height or weight on file to calculate BMI. Pt meets criteria for ? based on current BMI.  Labs:   Recent Labs Lab 08/01/17 2100 08/02/17 0025 08/02/17 0514 08/02/17 0850 08/02/17 1214  NA 139 139 144 144  --   K 4.0 3.1* 2.9* 2.7*  --   CL 100* 98* 104 107  --   CO2 8* 15* 19* 24  --   BUN 45* 44* 44* 43*  --   CREATININE 4.22* 4.35* 4.34* 4.33*  --   CALCIUM 7.7* 7.5* 7.9* 7.9*  --   MG 2.2  --   --   --  1.7  PHOS 7.3*  --   --   --  1.1*  GLUCOSE 924* 743* 399* 196*  --     [name] Bibles, MS, RD, LDN [facility] Inpatient Clinical Dietitian Pager: [phone] After Hours Pager: [phone]

## 2017-08-02 NOTE — Progress Notes (Signed)
eLink Physician-Brief Progress Note Patient Name: [name] [name] DOB: 04/04/1946 MRN: [id number]   Date of Service  08/02/2017  HPI/Events of Note  Low urine output  eICU Interventions  500 cc bolus normal saline     Intervention Category Intermediate Interventions: Other:  Raigen Jagielski 08/02/2017, 10:50 PM

## 2017-08-02 NOTE — Progress Notes (Signed)
This AM, Potassium low at 2.9. Patient has already received a total of 30MEQ KCL IV. Now have results of phosphorus very low at 1.1 and magnesium low at 1.7. An order for 60mmol of Kphos is already in place (not given yet); Will order 2g Magnesium sulfate IV as well.

## 2017-08-02 NOTE — Progress Notes (Signed)
[facility] Progress Note Patient Name: [name] [name] DOB: 1945/11/16 MRN: [id number]   Date of Service  08/02/2017  HPI/Events of Note  Severe DKA with marked improvement of PH to 7.4 on last abg  eICU Interventions  Stop bicarbonate drip     Intervention Category Major Interventions: Acid-Base disturbance - evaluation and management  Mauri [place], P 08/02/2017, 1:02 AM

## 2017-08-02 NOTE — Progress Notes (Signed)
CRITICAL VALUE ALERT  Critical Value:  Potassium 2.7  Date & Time Notied:  08/02/17 0948  Provider Notified: Yacoub  Orders Received/Actions taken: 4 runs K+ IV

## 2017-08-02 NOTE — Progress Notes (Signed)
Value:  UOP 35cc in 3.5 hr  Date & Time Notied:  08/02/17 2237  Provider Notified: Mannam  Orders Received/Actions taken: 500cc bolus NS
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
## 2017-08-02 NOTE — Procedures (Signed)
NGT Placement By MD  NGT placed, auscultated in good position.  Rush Farmer, M.D. [facility] Pulmonary/Critical Care Medicine. Pager: [phone]. After hours pager: [phone].

## 2017-08-03 ENCOUNTER — Inpatient Hospital Stay (HOSPITAL_COMMUNITY): Payer: Medicare Other

## 2017-08-03 DIAGNOSIS — N170 Acute kidney failure with tubular necrosis: Secondary | ICD-10-CM

## 2017-08-03 DIAGNOSIS — J988 Other specified respiratory disorders: Secondary | ICD-10-CM

## 2017-08-03 DIAGNOSIS — E1011 Type 1 diabetes mellitus with ketoacidosis with coma: Secondary | ICD-10-CM

## 2017-08-03 DIAGNOSIS — A419 Sepsis, unspecified organism: Principal | ICD-10-CM

## 2017-08-03 LAB — BLOOD GAS, ARTERIAL
Acid-Base Excess: 3 mmol/L — ABNORMAL HIGH (ref 0.0–2.0)
Acid-Base Excess: 1 mmol/L (ref 0.0–2.0)
Bicarbonate: 25.2 mmol/L (ref 20.0–28.0)
Bicarbonate: 25.1 mmol/L (ref 20.0–28.0)
Drawn by: 295031
Drawn by: 39898
FIO2: 40
FIO2: 40
MECHVT: 510 mL
MECHVT: 510 mL
O2 Saturation: 75.6 %
O2 Saturation: 99.2 %
PEEP: 5 cmH2O
PEEP: 5 cmH2O
Patient temperature: 97.5
Patient temperature: 97.6
RATE: 32 resp/min
RATE: 14 resp/min
pCO2 arterial: 26.2 mmHg — ABNORMAL LOW (ref 32.0–48.0)
pCO2 arterial: 39 mmHg (ref 32.0–48.0)
pH, Arterial: 7.586 — ABNORMAL HIGH (ref 7.350–7.450)
pH, Arterial: 7.421 (ref 7.350–7.450)
pO2, Arterial: 31.7 mmHg — CL (ref 83.0–108.0)
pO2, Arterial: 146 mmHg — ABNORMAL HIGH (ref 83.0–108.0)

## 2017-08-03 LAB — URINE CULTURE: Culture: NO GROWTH

## 2017-08-03 LAB — PROCALCITONIN: Procalcitonin: 8.69 ng/mL

## 2017-08-03 LAB — CBC WITH DIFFERENTIAL/PLATELET
Basophils Absolute: 0 10*3/uL (ref 0.0–0.1)
Basophils Relative: 0 %
Eosinophils Absolute: 0.3 10*3/uL (ref 0.0–0.7)
Eosinophils Relative: 4 %
HCT: 21.1 % — ABNORMAL LOW (ref 39.0–52.0)
Hemoglobin: 7.2 g/dL — ABNORMAL LOW (ref 13.0–17.0)
Lymphocytes Relative: 24 %
Lymphs Abs: 1.9 10*3/uL (ref 0.7–4.0)
MCH: 30 pg (ref 26.0–34.0)
MCHC: 34.1 g/dL (ref 30.0–36.0)
MCV: 87.9 fL (ref 78.0–100.0)
Monocytes Absolute: 0.6 10*3/uL (ref 0.1–1.0)
Monocytes Relative: 7 %
Neutro Abs: 5.1 10*3/uL (ref 1.7–7.7)
Neutrophils Relative %: 65 %
Platelets: 136 10*3/uL — ABNORMAL LOW (ref 150–400)
RBC: 2.4 MIL/uL — ABNORMAL LOW (ref 4.22–5.81)
RDW: 14.5 % (ref 11.5–15.5)
WBC: 7.9 10*3/uL (ref 4.0–10.5)

## 2017-08-03 LAB — BASIC METABOLIC PANEL
Anion gap: 11 (ref 5–15)
Anion gap: 5 (ref 5–15)
BUN: 43 mg/dL — ABNORMAL HIGH (ref 6–20)
BUN: 42 mg/dL — ABNORMAL HIGH (ref 6–20)
CO2: 25 mmol/L (ref 22–32)
CO2: 26 mmol/L (ref 22–32)
Calcium: 7.1 mg/dL — ABNORMAL LOW (ref 8.9–10.3)
Calcium: 7 mg/dL — ABNORMAL LOW (ref 8.9–10.3)
Chloride: 107 mmol/L (ref 101–111)
Chloride: 108 mmol/L (ref 101–111)
Creatinine, Ser: 4.43 mg/dL — ABNORMAL HIGH (ref 0.61–1.24)
Creatinine, Ser: 4.4 mg/dL — ABNORMAL HIGH (ref 0.61–1.24)
GFR calc Af Amer: 14 mL/min — ABNORMAL LOW (ref >60)
GFR calc Af Amer: 14 mL/min — ABNORMAL LOW (ref >60)
GFR calc non Af Amer: 12 mL/min — ABNORMAL LOW (ref >60)
GFR calc non Af Amer: 12 mL/min — ABNORMAL LOW (ref >60)
Glucose, Bld: 107 mg/dL — ABNORMAL HIGH (ref 65–99)
Glucose, Bld: 132 mg/dL — ABNORMAL HIGH (ref 65–99)
Potassium: 3.8 mmol/L (ref 3.5–5.1)
Potassium: 3.8 mmol/L (ref 3.5–5.1)
Sodium: 143 mmol/L (ref 135–145)
Sodium: 139 mmol/L (ref 135–145)

## 2017-08-03 LAB — GLUCOSE, CAPILLARY
Glucose-Capillary: 102 mg/dL — ABNORMAL HIGH (ref 65–99)
Glucose-Capillary: 106 mg/dL — ABNORMAL HIGH (ref 65–99)
Glucose-Capillary: 118 mg/dL — ABNORMAL HIGH (ref 65–99)
Glucose-Capillary: 127 mg/dL — ABNORMAL HIGH (ref 65–99)
Glucose-Capillary: 139 mg/dL — ABNORMAL HIGH (ref 65–99)
Glucose-Capillary: 142 mg/dL — ABNORMAL HIGH (ref 65–99)
Glucose-Capillary: 149 mg/dL — ABNORMAL HIGH (ref 65–99)
Glucose-Capillary: 163 mg/dL — ABNORMAL HIGH (ref 65–99)
Glucose-Capillary: 180 mg/dL — ABNORMAL HIGH (ref 65–99)
Glucose-Capillary: 190 mg/dL — ABNORMAL HIGH (ref 65–99)
Glucose-Capillary: 195 mg/dL — ABNORMAL HIGH (ref 65–99)
Glucose-Capillary: 258 mg/dL — ABNORMAL HIGH (ref 65–99)

## 2017-08-03 LAB — MAGNESIUM
Magnesium: 2.2 mg/dL (ref 1.7–2.4)
Magnesium: 2.1 mg/dL (ref 1.7–2.4)

## 2017-08-03 LAB — PHOSPHORUS
Phosphorus: 4.1 mg/dL (ref 2.5–4.6)
Phosphorus: 3.8 mg/dL (ref 2.5–4.6)

## 2017-08-03 MED ORDER — INSULIN DETEMIR 100 UNIT/ML ~~LOC~~ SOLN
8.0000 [IU] | Freq: Every day | SUBCUTANEOUS | Status: DC
  Administered 2017-08-03 – 2017-08-05 (×3): 8 [IU] via SUBCUTANEOUS
  Filled 2017-08-03 (×5): qty 0.08

## 2017-08-03 MED ORDER — ASPIRIN EC 81 MG PO TBEC
81.0000 mg | DELAYED_RELEASE_TABLET | Freq: Every day | ORAL | Status: DC
  Administered 2017-08-03: 81 mg via ORAL
  Filled 2017-08-03: qty 1

## 2017-08-03 MED ORDER — DEXTROSE 10 % IV SOLN: INTRAVENOUS | Status: DC | PRN

## 2017-08-03 MED ORDER — INSULIN ASPART 100 UNIT/ML ~~LOC~~ SOLN: 0.0000 [IU] | Freq: Three times a day (TID) | SUBCUTANEOUS | Status: DC

## 2017-08-03 MED ORDER — INSULIN GLARGINE 100 UNIT/ML ~~LOC~~ SOLN
10.0000 [IU] | Freq: Every day | SUBCUTANEOUS | Status: DC
  Administered 2017-08-03: 10 [IU] via SUBCUTANEOUS
  Filled 2017-08-03: qty 0.1

## 2017-08-03 MED ORDER — PANTOPRAZOLE SODIUM 40 MG PO PACK
40.0000 mg | PACK | Freq: Every day | ORAL | Status: DC
  Administered 2017-08-03 – 2017-08-06 (×4): 40 mg
  Filled 2017-08-03 (×4): qty 20

## 2017-08-03 MED ORDER — INSULIN ASPART 100 UNIT/ML ~~LOC~~ SOLN
1.0000 [IU] | SUBCUTANEOUS | Status: DC
  Administered 2017-08-03 (×2): 1 [IU] via SUBCUTANEOUS

## 2017-08-03 MED ORDER — INSULIN ASPART 100 UNIT/ML ~~LOC~~ SOLN
1.0000 [IU] | SUBCUTANEOUS | Status: DC
  Administered 2017-08-03: 3 [IU] via SUBCUTANEOUS
  Administered 2017-08-03: 2 [IU] via SUBCUTANEOUS

## 2017-08-03 MED ORDER — VITAL AF 1.2 CAL PO LIQD
1000.0000 mL | ORAL | Status: DC
  Administered 2017-08-03 – 2017-08-05 (×4): 1000 mL

## 2017-08-03 MED ORDER — INSULIN GLARGINE 100 UNIT/ML ~~LOC~~ SOLN
8.0000 [IU] | SUBCUTANEOUS | Status: DC
  Administered 2017-08-03: 8 [IU] via SUBCUTANEOUS
  Filled 2017-08-03: qty 0.08

## 2017-08-03 MED ORDER — INSULIN ASPART 100 UNIT/ML ~~LOC~~ SOLN
0.0000 [IU] | SUBCUTANEOUS | Status: DC
  Administered 2017-08-03 (×2): 2 [IU] via SUBCUTANEOUS
  Administered 2017-08-04: 3 [IU] via SUBCUTANEOUS
  Administered 2017-08-04 – 2017-08-05 (×4): 2 [IU] via SUBCUTANEOUS
  Administered 2017-08-05: 5 [IU] via SUBCUTANEOUS
  Administered 2017-08-05 (×2): 3 [IU] via SUBCUTANEOUS
  Administered 2017-08-05: 1 [IU] via SUBCUTANEOUS
  Administered 2017-08-05: 2 [IU] via SUBCUTANEOUS
  Administered 2017-08-06: 7 [IU] via SUBCUTANEOUS
  Administered 2017-08-06: 1 [IU] via SUBCUTANEOUS
  Administered 2017-08-06 (×2): 5 [IU] via SUBCUTANEOUS
  Administered 2017-08-08: 1 [IU] via SUBCUTANEOUS
  Administered 2017-08-08: 3 [IU] via SUBCUTANEOUS
  Administered 2017-08-08: 5 [IU] via SUBCUTANEOUS
  Administered 2017-08-08: 1 [IU] via SUBCUTANEOUS
  Administered 2017-08-08: 3 [IU] via SUBCUTANEOUS
  Administered 2017-08-08 – 2017-08-09 (×2): 2 [IU] via SUBCUTANEOUS
  Administered 2017-08-09: 3 [IU] via SUBCUTANEOUS
  Administered 2017-08-09 (×2): 2 [IU] via SUBCUTANEOUS
  Administered 2017-08-09: 5 [IU] via SUBCUTANEOUS
  Administered 2017-08-09: 2 [IU] via SUBCUTANEOUS
  Administered 2017-08-10: 1 [IU] via SUBCUTANEOUS
  Administered 2017-08-10: 3 [IU] via SUBCUTANEOUS
  Administered 2017-08-10: 1 [IU] via SUBCUTANEOUS
  Administered 2017-08-10 (×2): 7 [IU] via SUBCUTANEOUS

## 2017-08-03 MED ORDER — SODIUM CHLORIDE 0.9 % IV SOLN
INTRAVENOUS | Status: DC
  Administered 2017-08-03 – 2017-08-04 (×2): via INTRAVENOUS

## 2017-08-03 NOTE — Progress Notes (Signed)
Per GlucoStabilizer protocol, the [facility] provider, Mannam, was notified that the insulin rate dropped below 1.0unit/hr, and Phase 2 orders were requested. Dr. Vaughan Browner gave orders for 8 units of Lantus and a sliding scale.

## 2017-08-03 NOTE — Progress Notes (Signed)
eLink Physician-Brief Progress Note Patient Name: [name] [name] DOB: November 30, 1945 MRN: [id number]   Date of Service  08/03/2017  HPI/Events of Note  Gap has closed.  Insulin drip at 1 unit/h  eICU Interventions  Transition to phase 2 DKA protocol 8 units Lantus ordered.  Transition off drip     Intervention Category Intermediate Interventions: Hyperglycemia - evaluation and treatment  Juliano Mceachin 08/03/2017, 5:09 AM

## 2017-08-03 NOTE — Progress Notes (Signed)
Initial Nutrition Assessment  DOCUMENTATION CODES:   Not applicable  INTERVENTION:  - Initiate TF of Vital AF 1.2 at a rate of 55 mL/hr (1320 mL daily) via NGT - Tube feeding regimen provides 1584 kcal, 99 grams of protein, and 1069 ml of H2O.   NUTRITION DIAGNOSIS:   Inadequate oral intake related to inability to eat as evidenced by NPO status.  GOAL:   Patient will meet greater than or equal to 90% of their needs  MONITOR:   Vent status, TF tolerance, I & O's, Weight trends  REASON FOR ASSESSMENT:   Consult Enteral/tube feeding initiation and management  ASSESSMENT:   Pt with PMH of uncontrolled type II DM, dementia, hx of CVA, depression and anxiety presents with AMS from DKA, intubated 10/20 to protect airway   Discussed pt with RN, pt with no BM yet. Propofol and glucose stabilizer d/c.  Per chart pt has had 13 ED visits r/t DM complications. Per chart pt's weight has trended upwards over the past couple months.  Patient is currently intubated on ventilator support MV: 6.7 L/min Temp (24hrs), Avg:98.3 F (36.8 C), Min:97.6 F (36.4 C), Max:98.9 F (37.2 C)  Nutrition focused physical exam completed. Findings include no fat or muscle depletions and no edema.  Labs reviewed; CBG 106-258, Hemoglobin 7.2 Medications reviewed; sliding scale insulin, Lantus, Protonix  Diet Order:  Diet NPO time specified  Skin:  Reviewed, no issues  Last BM:  Unknown BM Date  Height:   Ht Readings from Last 1 Encounters:  07/25/17 5\' 6"  (1.676 m)    Weight:   Wt Readings from Last 1 Encounters:  08/03/17 151 lb 3.8 oz (68.6 kg)    Ideal Body Weight:  64.5 kg  BMI:  Body mass index is 24.41 kg/m.  Estimated Nutritional Needs:   Kcal:  1536  Protein:  85-100 grams  Fluid:  >/= 1.5 L/d  EDUCATION NEEDS:   Education needs no appropriate at this time  Parks Ranger, MS, RDN, LDN 08/03/2017 1:19 PM

## 2017-08-03 NOTE — Progress Notes (Signed)
PULMONARY / CRITICAL CARE MEDICINE   Name: [name] [name] MRN: [id number] DOB: 08-25-1946    ADMISSION DATE:  08/01/2017 CONSULTATION DATE:  08/01/2017  REFERRING MD:    CHIEF COMPLAINT:  Altered mental status  HISTORY OF PRESENT ILLNESS:   71 yo male with brittle diabetes presented with altered mental status from DKA, lactic acidosis, VDRF with compromised airway, and sepsis. PMHx of DM, dementia, anxiety, depression, CVA.  SUBJECTIVE:  Remains on full vent support, insulin gtt.  VITAL SIGNS: BP 100/63   Pulse 64   Temp 98.3 F (36.8 C) (Axillary)   Resp 14   Wt 151 lb 3.8 oz (68.6 kg)   SpO2 100%   BMI 24.41 kg/m   VENTILATOR SETTINGS: Vent Mode: PRVC FiO2 (%):  [40 %] 40 % Set Rate:  [14 bmp-32 bmp] 14 bmp Vt Set:  [510 mL] 510 mL PEEP:  [5 cmH20] 5 cmH20 Plateau Pressure:  [15 cmH20-19 cmH20] 18 cmH20  INTAKE / OUTPUT: I/O last 3 completed shifts: In: 7386.1 [I.V.:5508.1; NG/GT:568; IV Piggyback:1310] Out: 605 [Urine:605]  PHYSICAL EXAMINATION:  General - sedated Eyes - pupils reactive ENT - ETT, NG tubes in place Cardiac - regular, no murmur Chest - no wheeze, rales Abd - soft, non tender Ext - no edema Skin - no rashes Neuro - opens eyes with stimulation   LABS:  BMET  Recent Labs Lab 08/02/17 2151 08/03/17 0147 08/03/17 0437  NA 140 143 139  K 3.7 3.8 3.8  CL 108 107 108  CO2 26 25 26   BUN 43* 43* 42*  CREATININE 4.52* 4.43* 4.40*  GLUCOSE 150* 107* 132*   Electrolytes  Recent Labs Lab 08/02/17 1403 08/02/17 1639 08/02/17 2151 08/03/17 0147 08/03/17 0437  CALCIUM  --   --  7.0* 7.1* 7.0*  MG 1.6* 2.5*  --   --  2.2  PHOS 1.6* 1.9*  --   --  4.1   CBC  Recent Labs Lab 08/01/17 1501 08/01/17 1619 08/02/17 0514 08/03/17 0437  WBC 20.3*  --  10.9* 7.9  HGB 8.7* 10.2* 7.3* 7.2*  HCT 33.1* 30.0* 21.7* 21.1*  PLT 324  --  198 136*   Coag's No results for input(s): APTT, INR in the last 168 hours.  Sepsis  Markers  Recent Labs Lab 08/01/17 2100  08/02/17 0514 08/02/17 0926 08/02/17 1403 08/02/17 1812 08/03/17 0437  LATICACIDVEN 6.9*  < >  --  7.2* 2.1* 1.5  --   PROCALCITON 5.14  --  8.78  --   --   --  8.69  < > = values in this interval not displayed. ABG  Recent Labs Lab 08/02/17 0018 08/02/17 0945 08/03/17 0320  PHART 7.407 7.586* 7.421  PCO2ART 22.5* 26.2* 39.0  PO2ART 442* 31.7* 146*   Liver Enzymes  Recent Labs Lab 08/01/17 1608  AST 21  ALT 26  ALKPHOS 105  BILITOT 1.5*  ALBUMIN 3.3*   Cardiac Enzymes  Recent Labs Lab 08/01/17 2100  TROPONINI <0.03   Glucose  Recent Labs Lab 08/03/17 0148 08/03/17 0246 08/03/17 0348 08/03/17 0457 08/03/17 0608 08/03/17 0737  GLUCAP 106* 127* 142* 118* 149* 163*   Imaging Dg Chest Port 1 View  Result Date: 08/03/2017 CLINICAL DATA:  Endotracheal tube.  Diabetic ketoacidosis. EXAM: PORTABLE CHEST 1 VIEW COMPARISON:  One-view chest x-ray 08/02/2017 FINDINGS: The endotracheal tube is stable, 4 cm above the carina. A right IJ line is stable, just be on the cavoatrial junction. A NG tube terminates in the  stomach. The heart is mildly enlarged. There is progressive volume loss with bibasilar airspace disease, likely atelectasis. There is no edema or effusion. No pneumothorax is present. IMPRESSION: 1. Progressive volume loss and bibasilar airspace disease, likely atelectasis. 2. The support apparatus is stable. Electronically Signed   By: San Morelle M.D.   On: 08/03/2017 07:37   Dg Abd Portable 1v  Result Date: 08/02/2017 CLINICAL DATA:  Encounter for nasogastric tube placement. EXAM: PORTABLE ABDOMEN - 1 VIEW COMPARISON:  None. FINDINGS: Nasogastric tube has been placed and overlies the LEFT upper quadrant. Presumably this lies within the stomach, with the tip directed toward the fundus. Visualized bowel gas pattern appears nonobstructive. IMPRESSION: Nasogastric tube tip is directed upward into the LEFT upper  quadrant, likely into the fundus of the stomach. Electronically Signed   By: Staci Righter M.D.   On: 08/02/2017 14:51   STUDIES:   CULTURES: Blood 10/20 >> Urine 10/21 >> Sputum 10/21 >>   ANTIBIOTICS: Zosyn 10/20 >> Vancomycin 10/20 >>  SIGNIFICANT EVENTS: 08/01/2017>> Admission for DKA/ AMS/ ? Sepsis  LINES/TUBES: RIJ Triple Lumen CVC 10/20 >> ETT 10/20 >>  DISCUSSION: 71 yo male with recurrent DKA, VDRF, sepsis, ARF.  ASSESSMENT / PLAN:  DKA. - plan to transition off insulin gtt  Acute renal failure with ATN >> baseline creatinine 1.18 from 07/28/17. Hyperkalemia >> resolved. Lactic acidosis >> resolved. - continue IV fluids - f/u BMET - monitor renal fx  Compromised airway. - full vent support - f/u CXR  Sepsis likely from pneumonia. - day 2 of vancomycin, zosyn - continue IV fluids  Anemia of critical illness and chronic disease. - f/u CBC  Acute metabolic encephalopathy. Hx of dementia, depression, anxiety, CVA. - RASS goal 0  DVT prophylaxis - SQ heparin SUP - protonix Nutrition - tube feeds Goals of care - Full code  CC time 32 minutes  Chesley Mires, MD [place] 08/03/2017, 8:26 AM Pager:  [phone] After 3pm call: [phone]

## 2017-08-04 ENCOUNTER — Ambulatory Visit: Payer: Medicare Other | Admitting: Family Medicine

## 2017-08-04 DIAGNOSIS — J181 Lobar pneumonia, unspecified organism: Secondary | ICD-10-CM

## 2017-08-04 LAB — BASIC METABOLIC PANEL
Anion gap: 10 (ref 5–15)
BUN: 42 mg/dL — ABNORMAL HIGH (ref 6–20)
CO2: 24 mmol/L (ref 22–32)
Calcium: 7.4 mg/dL — ABNORMAL LOW (ref 8.9–10.3)
Chloride: 108 mmol/L (ref 101–111)
Creatinine, Ser: 3.86 mg/dL — ABNORMAL HIGH (ref 0.61–1.24)
GFR calc Af Amer: 17 mL/min — ABNORMAL LOW (ref >60)
GFR calc non Af Amer: 14 mL/min — ABNORMAL LOW (ref >60)
Glucose, Bld: 83 mg/dL (ref 65–99)
Potassium: 4.2 mmol/L (ref 3.5–5.1)
Sodium: 142 mmol/L (ref 135–145)

## 2017-08-04 LAB — GLUCOSE, CAPILLARY
Glucose-Capillary: 158 mg/dL — ABNORMAL HIGH (ref 65–99)
Glucose-Capillary: 185 mg/dL — ABNORMAL HIGH (ref 65–99)
Glucose-Capillary: 218 mg/dL — ABNORMAL HIGH (ref 65–99)
Glucose-Capillary: 220 mg/dL — ABNORMAL HIGH (ref 65–99)
Glucose-Capillary: 78 mg/dL (ref 65–99)
Glucose-Capillary: 93 mg/dL (ref 65–99)

## 2017-08-04 LAB — CULTURE, RESPIRATORY W GRAM STAIN: Culture: NORMAL

## 2017-08-04 LAB — IRON AND TIBC
Iron: 37 ug/dL — ABNORMAL LOW (ref 45–182)
Saturation Ratios: 18 % (ref 17.9–39.5)
TIBC: 203 ug/dL — ABNORMAL LOW (ref 250–450)
UIBC: 166 ug/dL

## 2017-08-04 LAB — FERRITIN: Ferritin: 671 ng/mL — ABNORMAL HIGH (ref 24–336)

## 2017-08-04 MED ORDER — ASPIRIN 81 MG PO CHEW
81.0000 mg | CHEWABLE_TABLET | Freq: Every day | ORAL | Status: DC
  Administered 2017-08-04 – 2017-08-06 (×3): 81 mg via ORAL
  Filled 2017-08-04 (×2): qty 1

## 2017-08-04 MED ORDER — ASPIRIN 81 MG PO CHEW
CHEWABLE_TABLET | ORAL | Status: AC
  Filled 2017-08-04: qty 1

## 2017-08-04 NOTE — Care Management Note (Signed)
Case Management Note  Patient Details  Name: [name] [name] MRN: [id number] Date of Birth: [birth year]  Subjective/Objective:   Pt admitted on 08/01/17 with severe DKA, AMS, hyperglycemia.  Pt intubated for airway protection.  PTA, pt lived at home alone; daughter works but checks on pt during the day.  Pt active with [facility] under [facility] program ([facility])                 Action/Plan: Pt currently remains intubated.  Will follow for discharge planning as pt progresses.   Expected Discharge Date:                  Expected Discharge Plan:     In-House Referral:     Discharge planning Services  CM Consult  Post Acute Care Choice:    Choice offered to:     DME Arranged:    DME Agency:     HH Arranged:    HH Agency:     Status of Service:  In process, will continue to follow  If discussed at Long Length of Stay Meetings, dates discussed:    Additional Comments:  Reinaldo Raddle, RN, BSN  Trauma/Neuro ICU Case Manager [phone]

## 2017-08-04 NOTE — Progress Notes (Signed)
PULMONARY / CRITICAL CARE MEDICINE   Name: [name] [name] MRN: [id number] DOB: 1946-01-16    ADMISSION DATE:  08/01/2017 CONSULTATION DATE:  08/01/2017  REFERRING MD:    CHIEF COMPLAINT:  Altered mental status  HISTORY OF PRESENT ILLNESS:   71 yo male with brittle diabetes presented with altered mental status from DKA, lactic acidosis, VDRF with compromised airway, and sepsis. PMHx of DM, dementia, anxiety, depression, CVA.  SUBJECTIVE:  Off insulin, failed wean due to rr 43 and Vt 261.   VITAL SIGNS: BP 102/61   Pulse 65   Temp 97.8 F (36.6 C) (Axillary)   Resp 14   Ht 5\' 6"  (1.676 m)   Wt 153 lb 10.6 oz (69.7 kg)   SpO2 100%   BMI 24.80 kg/m   VENTILATOR SETTINGS: Vent Mode: PRVC FiO2 (%):  [30 %-40 %] 30 % Set Rate:  [14 bmp] 14 bmp Vt Set:  [510 mL] 510 mL PEEP:  [5 cmH20] 5 cmH20 Pressure Support:  [18 cmH20] 18 cmH20 Plateau Pressure:  [19 cmH20-20 cmH20] 20 cmH20  INTAKE / OUTPUT: I/O last 3 completed shifts: In: 6219 [I.V.:4139.3; NG/GT:1679.8; IV Piggyback:400] Out: 6789 [Urine:1745]  PHYSICAL EXAMINATION:  General:  Frail, ill appearing male , follows commands despite being on  fentanyl drip HEENT: MM pink/moist, Et-> vent PSY:NA sedated on vent Neuro: Follows commands  CV: HSR RRR PULM: even/non-labored, lungs bilaterally decreased bs bases FY:BOFB, non-tender, bsx4 active  Extremities: warm/dry, - edema  Skin: no rashes or lesions   LABS:  BMET  Recent Labs Lab 08/03/17 0147 08/03/17 0437 08/04/17 0500  NA 143 139 142  K 3.8 3.8 4.2  CL 107 108 108  CO2 25 26 24   BUN 43* 42* 42*  CREATININE 4.43* 4.40* 3.86*  GLUCOSE 107* 132* 83   Electrolytes  Recent Labs Lab 08/02/17 1639  08/03/17 0147 08/03/17 0437 08/03/17 1636 08/04/17 0500  CALCIUM  --   < > 7.1* 7.0*  --  7.4*  MG 2.5*  --   --  2.2 2.1  --   PHOS 1.9*  --   --  4.1 3.8  --   < > = values in this interval not displayed. CBC  Recent Labs Lab 08/01/17 1501  08/01/17 1619 08/02/17 0514 08/03/17 0437  WBC 20.3*  --  10.9* 7.9  HGB 8.7* 10.2* 7.3* 7.2*  HCT 33.1* 30.0* 21.7* 21.1*  PLT 324  --  198 136*   Coag's No results for input(s): APTT, INR in the last 168 hours.  Sepsis Markers  Recent Labs Lab 08/01/17 2100  08/02/17 0514 08/02/17 0926 08/02/17 1403 08/02/17 1812 08/03/17 0437  LATICACIDVEN 6.9*  < >  --  7.2* 2.1* 1.5  --   PROCALCITON 5.14  --  8.78  --   --   --  8.69  < > = values in this interval not displayed. ABG  Recent Labs Lab 08/02/17 0018 08/02/17 0945 08/03/17 0320  PHART 7.407 7.586* 7.421  PCO2ART 22.5* 26.2* 39.0  PO2ART 442* 31.7* 146*   Liver Enzymes  Recent Labs Lab 08/01/17 1608  AST 21  ALT 26  ALKPHOS 105  BILITOT 1.5*  ALBUMIN 3.3*   Cardiac Enzymes  Recent Labs Lab 08/01/17 2100  TROPONINI <0.03   Glucose  Recent Labs Lab 08/03/17 1235 08/03/17 1644 08/03/17 1957 08/03/17 2345 08/04/17 0352 08/04/17 0812  GLUCAP 258* 195* 180* 102* 78 93   Imaging No results found. STUDIES:   CULTURES: Blood 10/20 >>  Urine 10/21 >>neg Sputum 10/21 >>   ANTIBIOTICS: Zosyn 10/20 >> Vancomycin 10/20 >>  SIGNIFICANT EVENTS: 08/01/2017>> Admission for DKA/ AMS/ ? Sepsis  LINES/TUBES: RIJ Triple Lumen CVC 10/20 >> ETT 10/20 >>  DISCUSSION: 71 yo male with recurrent DKA, VDRF, sepsis, ARF. 10/23 off insulin drip, metabolic disarray improved. Will stated weaning from vent.  ASSESSMENT / PLAN:  DKA. - off insulin drip 10/23  Recent Labs Lab 08/03/17 0147 08/03/17 0437 08/04/17 0500  K 3.8 3.8 4.2    Lab Results  Component Value Date   CREATININE 3.86 (H) 08/04/2017   CREATININE 4.40 (H) 08/03/2017   CREATININE 4.43 (H) 08/03/2017    Recent Labs Lab 08/03/17 0147 08/03/17 0437 08/04/17 0500  NA 143 139 142    Acute renal failure with ATN >> baseline creatinine 1.18 from 07/28/17. Slowly improving Hyperkalemia >> resolved. Lactic acidosis >>  resolved. - continue IV fluids - f/u BMET - monitor renal fx  Compromised airway. - full vent support - f/u CXR -wean as tolerated  Sepsis likely from pneumonia. - day 3 of vancomycin, zosyn with no + culture data 10/23 - continue IV fluids  Anemia of critical illness and chronic disease.  Recent Labs  08/02/17 0514 08/03/17 0437  HGB 7.3* 7.2*    - f/u CBC  Acute metabolic encephalopathy. 10/23 awake and follows commands. Weaning 10/23 Hx of dementia, depression, anxiety, CVA. - RASS goal 0  DVT prophylaxis - SQ heparin SUP - protonix Nutrition - tube feeds Goals of care - Full code  CC time 31 minutes  Richardson Landry Minor ACNP Maryanna Shape PCCM Pager [phone] till 3 pm If no answer page [phone] 08/04/2017, 8:50 AM

## 2017-08-04 NOTE — Progress Notes (Signed)
Pharmacy Antibiotic Note  [name] [name] is a 71 y.o. male admitted on 08/01/2017 with sepsis/pneumonia.  Pharmacy managing vancomycin and zosyn dosing. He remains in acute renal failure and CrCl ~ 15 ml/min. WBC wnl. Cultures remain negative. On D#4 of abx.   Vancomycin trough goal 15-20  Plan: 1) Continue Vancomycin 1g IV q48 2) Continue Zosyn 2.25g IV q8 3) Follow renal function, cultures, LOT, level if needed  Height: 5\' 6"  (167.6 cm) Weight: 153 lb 10.6 oz (69.7 kg) IBW/kg (Calculated) : 63.8  Temp (24hrs), Avg:98.3 F (36.8 C), Min:97.8 F (36.6 C), Max:98.9 F (37.2 C)   Recent Labs Lab 08/01/17 1501  08/01/17 2100 08/02/17 0025 08/02/17 0514 08/02/17 0850 08/02/17 0926 08/02/17 1403 08/02/17 1812 08/02/17 2151 08/03/17 0147 08/03/17 0437 08/04/17 0500  WBC 20.3*  --   --   --  10.9*  --   --   --   --   --   --  7.9  --   CREATININE 4.30*  < > 4.22* 4.35* 4.34* 4.33*  --   --   --  4.52* 4.43* 4.40* 3.86*  LATICACIDVEN  --   < > 6.9* 6.4*  --   --  7.2* 2.1* 1.5  --   --   --   --   < > = values in this interval not displayed.  Estimated Creatinine Clearance: 15.6 mL/min (A) (by C-G formula based on SCr of 3.86 mg/dL (H)).    No Known Allergies  Antimicrobials this admission: 10/20 Vancomycin >> 10/20 Zosyn >>  Dose adjustments this admission: n/a  Microbiology results: 10/20 blood x2>> ngtd 10/20 urine >>negF 10/21 TA>>ngtd   Thank you for allowing pharmacy to be a part of this patient's care.  Albertina Parr, PharmD., BCPS Clinical Pharmacist Pager [phone]

## 2017-08-05 ENCOUNTER — Inpatient Hospital Stay (HOSPITAL_COMMUNITY): Payer: Medicare Other

## 2017-08-05 LAB — BASIC METABOLIC PANEL
Anion gap: 9 (ref 5–15)
BUN: 41 mg/dL — ABNORMAL HIGH (ref 6–20)
CO2: 25 mmol/L (ref 22–32)
Calcium: 8 mg/dL — ABNORMAL LOW (ref 8.9–10.3)
Chloride: 110 mmol/L (ref 101–111)
Creatinine, Ser: 3.1 mg/dL — ABNORMAL HIGH (ref 0.61–1.24)
GFR calc Af Amer: 22 mL/min — ABNORMAL LOW (ref >60)
GFR calc non Af Amer: 19 mL/min — ABNORMAL LOW (ref >60)
Glucose, Bld: 253 mg/dL — ABNORMAL HIGH (ref 65–99)
Potassium: 4.2 mmol/L (ref 3.5–5.1)
Sodium: 144 mmol/L (ref 135–145)

## 2017-08-05 LAB — GLUCOSE, CAPILLARY
Glucose-Capillary: 174 mg/dL — ABNORMAL HIGH (ref 65–99)
Glucose-Capillary: 188 mg/dL — ABNORMAL HIGH (ref 65–99)
Glucose-Capillary: 193 mg/dL — ABNORMAL HIGH (ref 65–99)
Glucose-Capillary: 216 mg/dL — ABNORMAL HIGH (ref 65–99)
Glucose-Capillary: 288 mg/dL — ABNORMAL HIGH (ref 65–99)

## 2017-08-05 LAB — CBC
HCT: 24.7 % — ABNORMAL LOW (ref 39.0–52.0)
Hemoglobin: 8 g/dL — ABNORMAL LOW (ref 13.0–17.0)
MCH: 29.6 pg (ref 26.0–34.0)
MCHC: 32.4 g/dL (ref 30.0–36.0)
MCV: 91.5 fL (ref 78.0–100.0)
Platelets: 122 10*3/uL — ABNORMAL LOW (ref 150–400)
RBC: 2.7 MIL/uL — ABNORMAL LOW (ref 4.22–5.81)
RDW: 15.6 % — ABNORMAL HIGH (ref 11.5–15.5)
WBC: 5.9 10*3/uL (ref 4.0–10.5)

## 2017-08-05 LAB — MAGNESIUM: Magnesium: 1.9 mg/dL (ref 1.7–2.4)

## 2017-08-05 LAB — PHOSPHORUS: Phosphorus: 3.6 mg/dL (ref 2.5–4.6)

## 2017-08-05 MED ORDER — PIPERACILLIN-TAZOBACTAM 3.375 G IVPB
3.3750 g | Freq: Three times a day (TID) | INTRAVENOUS | Status: DC
  Administered 2017-08-05 – 2017-08-10 (×13): 3.375 g via INTRAVENOUS
  Filled 2017-08-05 (×16): qty 50

## 2017-08-05 MED ORDER — AMLODIPINE 1 MG/ML ORAL SUSPENSION
10.0000 mg | Freq: Every day | ORAL | Status: DC
Start: 2017-08-05 — End: 2017-08-05

## 2017-08-05 MED ORDER — FUROSEMIDE 10 MG/ML IJ SOLN
60.0000 mg | Freq: Once | INTRAMUSCULAR | Status: AC
  Administered 2017-08-05: 60 mg via INTRAVENOUS
  Filled 2017-08-05: qty 6

## 2017-08-05 MED ORDER — AMLODIPINE BESYLATE 10 MG PO TABS
10.0000 mg | ORAL_TABLET | Freq: Every day | ORAL | Status: DC
  Administered 2017-08-05 – 2017-08-06 (×2): 10 mg
  Filled 2017-08-05 (×2): qty 1

## 2017-08-05 NOTE — Progress Notes (Signed)
PULMONARY / CRITICAL CARE MEDICINE   Name: Kellyn Mansfield MRN: [id number] DOB: Jan 04, 1946    ADMISSION DATE:  08/01/2017 CONSULTATION DATE:  08/01/2017  REFERRING MD:    CHIEF COMPLAINT:  Altered mental status  HISTORY OF PRESENT ILLNESS:   71 yo male with brittle diabetes presented with altered mental status from DKA, lactic acidosis, VDRF with compromised airway, and sepsis. PMHx of DM, dementia, anxiety, depression, CVA.  SUBJECTIVE:  Low Vt, increased RR with SBT.  VITAL SIGNS: BP (!) 167/75   Pulse 73   Temp 98 F (36.7 C) (Axillary)   Resp (!) 24   Ht 5\' 6"  (1.676 m)   Wt 152 lb 1.9 oz (69 kg)   SpO2 100%   BMI 24.55 kg/m   VENTILATOR SETTINGS: Vent Mode: PRVC FiO2 (%):  [30 %] 30 % Set Rate:  [14 bmp] 14 bmp Vt Set:  [510 mL] 510 mL PEEP:  [5 cmH20] 5 cmH20 Plateau Pressure:  [17 cmH20-24 cmH20] 19 cmH20  INTAKE / OUTPUT: I/O last 3 completed shifts: In: 5063.5 [I.V.:3048.5; NG/GT:1815; IV Piggyback:200] Out: 5852 [Urine:4515]  PHYSICAL EXAMINATION:  General - alert Eyes - pupils reactive ENT - ETT in place Cardiac - regular, no murmur Chest - b/l crackles Abd - soft, non tender Ext - no edema Skin - no rashes Neuro - RASS 0  LABS:  BMET  Recent Labs Lab 08/03/17 0437 08/04/17 0500 08/05/17 0435  NA 139 142 144  K 3.8 4.2 4.2  CL 108 108 110  CO2 26 24 25   BUN 42* 42* 41*  CREATININE 4.40* 3.86* 3.10*  GLUCOSE 132* 83 253*   Electrolytes  Recent Labs Lab 08/03/17 0437 08/03/17 1636 08/04/17 0500 08/05/17 0435  CALCIUM 7.0*  --  7.4* 8.0*  MG 2.2 2.1  --  1.9  PHOS 4.1 3.8  --  3.6   CBC  Recent Labs Lab 08/02/17 0514 08/03/17 0437 08/05/17 0435  WBC 10.9* 7.9 5.9  HGB 7.3* 7.2* 8.0*  HCT 21.7* 21.1* 24.7*  PLT 198 136* 122*   Coag's No results for input(s): APTT, INR in the last 168 hours.  Sepsis Markers  Recent Labs Lab 08/01/17 2100  08/02/17 0514 08/02/17 0926 08/02/17 1403 08/02/17 1812 08/03/17 0437   LATICACIDVEN 6.9*  < >  --  7.2* 2.1* 1.5  --   PROCALCITON 5.14  --  8.78  --   --   --  8.69  < > = values in this interval not displayed. ABG  Recent Labs Lab 08/02/17 0018 08/02/17 0945 08/03/17 0320  PHART 7.407 7.586* 7.421  PCO2ART 22.5* 26.2* 39.0  PO2ART 442* 31.7* 146*   Liver Enzymes  Recent Labs Lab 08/01/17 1608  AST 21  ALT 26  ALKPHOS 105  BILITOT 1.5*  ALBUMIN 3.3*   Cardiac Enzymes  Recent Labs Lab 08/01/17 2100  TROPONINI <0.03   Glucose  Recent Labs Lab 08/04/17 0812 08/04/17 1143 08/04/17 1543 08/04/17 2021 08/04/17 2338 08/05/17 0324  GLUCAP 93 220* 158* 185* 218* 188*   Imaging No results found.   STUDIES:   CULTURES: Blood 10/20 >> Urine 10/21 >> negative Sputum 10/21 >> negative  ANTIBIOTICS: Zosyn 10/20 >> Vancomycin 10/20 >> 10/23  SIGNIFICANT EVENTS: 10/20 Admit 10/23 off insulin gtt  LINES/TUBES: Rt IJ CVL 10/20 >> ETT 10/20 >>  DISCUSSION: 71 yo male with recurrent DKA, VDRF, sepsis, ARF.  ASSESSMENT / PLAN:  DKA >> resolved. Insulin dependent DM. - SSI with levemir  Acute renal  failure with ATN >> baseline creatinine 1.18 from 07/28/17. CKD 2. Hypervolemia. Hyperkalemia >> resolved. Lactic acidosis >> resolved. - KVO IV fluids - lasix 60 mg IV x one 10/24 - keep foley in for now to closely monitor I/O's  Compromised airway. - pressure support wean as tolerated >> not ready for extubation yet  Sepsis likely from pneumonia. - day 3 of Abx, currently on zosyn  Hypertension. - resume norvasc  Anemia of critical illness and chronic disease. Thrombocytopenia in setting of sepsis. - f/u CBC  Acute metabolic encephalopathy. Hx of dementia, depression, anxiety, CVA. - RASS goal 0  DVT prophylaxis - SQ heparin SUP - protonix Nutrition - tube feeds Goals of care - Full code  Chesley Mires, MD [place] 08/05/2017, 7:15 AM Pager:  [phone] After 3pm call:  [phone]

## 2017-08-05 NOTE — Progress Notes (Signed)
   08/05/17 1000  Clinical Encounter Type  Visited With Patient and family together  Visit Type Spiritual support  Consult/Referral To Chaplain  Spiritual Encounters  Spiritual Needs Other (Comment) (social support)  Stress Factors  Patient Stress Factors Health changes  Family Stress Factors Exhausted  chaplain visited with PT daughter.  She is a Marine scientist and expressed wanting to know what was going on with her father.  Matt attended to the PT daughters needs.  Chaplain explored  Coping mechanisms with the daughter.  Chaplain encouraged the daughter to take life by the horns.  PT is tickled pink to see daughter when she comes.

## 2017-08-05 NOTE — Progress Notes (Signed)
Wean ended at this time d/t apneic episodes. Will continue to monitor patient.

## 2017-08-05 NOTE — Progress Notes (Signed)
Pharmacy Antibiotic Note  Jayziah Bankhead is a 71 y.o. male admitted on 08/01/2017 with sepsis/pneumonia.  Pharmacy managing zosyn dosing. Patient's AKI is improving and SCr is down to 3.1. CrCl ~20 ml/min. WBC wnl. Cultures remain negative. On D#5 of abx.   Plan: 1) Increase Zosyn to 3.375 g IV q8 2) Follow renal function, cultures, LOT, level if needed  Height: 5\' 6"  (167.6 cm) Weight: 152 lb 1.9 oz (69 kg) IBW/kg (Calculated) : 63.8  Temp (24hrs), Avg:98.4 F (36.9 C), Min:97.8 F (36.6 C), Max:99.3 F (37.4 C)   Recent Labs Lab 08/01/17 1501  08/01/17 2100 08/02/17 0025 08/02/17 0514  08/02/17 0926 08/02/17 1403 08/02/17 1812 08/02/17 2151 08/03/17 0147 08/03/17 0437 08/04/17 0500 08/05/17 0435  WBC 20.3*  --   --   --  10.9*  --   --   --   --   --   --  7.9  --  5.9  CREATININE 4.30*  < > 4.22* 4.35* 4.34*  < >  --   --   --  4.52* 4.43* 4.40* 3.86* 3.10*  LATICACIDVEN  --   < > 6.9* 6.4*  --   --  7.2* 2.1* 1.5  --   --   --   --   --   < > = values in this interval not displayed.  Estimated Creatinine Clearance: 19.4 mL/min (A) (by C-G formula based on SCr of 3.1 mg/dL (H)).    No Known Allergies  Antimicrobials this admission: 10/20 Vancomycin >>10/23  10/20 Zosyn >>  Dose adjustments this admission: n/a  Microbiology results: 10/20 blood x2>> ngtd 10/20 urine >>negF 10/21 TA>>ngtd   Thank you for allowing pharmacy to be a part of this patient's care.  Albertina Parr, PharmD., BCPS Clinical Pharmacist Pager [phone]

## 2017-08-06 ENCOUNTER — Inpatient Hospital Stay (HOSPITAL_COMMUNITY): Payer: Medicare Other

## 2017-08-06 LAB — CBC
HCT: 23.5 % — ABNORMAL LOW (ref 39.0–52.0)
Hemoglobin: 7.5 g/dL — ABNORMAL LOW (ref 13.0–17.0)
MCH: 29.4 pg (ref 26.0–34.0)
MCHC: 31.9 g/dL (ref 30.0–36.0)
MCV: 92.2 fL (ref 78.0–100.0)
Platelets: 121 10*3/uL — ABNORMAL LOW (ref 150–400)
RBC: 2.55 MIL/uL — ABNORMAL LOW (ref 4.22–5.81)
RDW: 15.6 % — ABNORMAL HIGH (ref 11.5–15.5)
WBC: 7.6 10*3/uL (ref 4.0–10.5)

## 2017-08-06 LAB — GLUCOSE, CAPILLARY
Glucose-Capillary: 104 mg/dL — ABNORMAL HIGH (ref 65–99)
Glucose-Capillary: 129 mg/dL — ABNORMAL HIGH (ref 65–99)
Glucose-Capillary: 259 mg/dL — ABNORMAL HIGH (ref 65–99)
Glucose-Capillary: 274 mg/dL — ABNORMAL HIGH (ref 65–99)
Glucose-Capillary: 303 mg/dL — ABNORMAL HIGH (ref 65–99)
Glucose-Capillary: 80 mg/dL (ref 65–99)

## 2017-08-06 LAB — CULTURE, BLOOD (ROUTINE X 2)
Culture: NO GROWTH
Culture: NO GROWTH
Special Requests: ADEQUATE
Special Requests: ADEQUATE

## 2017-08-06 LAB — BASIC METABOLIC PANEL
Anion gap: 10 (ref 5–15)
BUN: 35 mg/dL — ABNORMAL HIGH (ref 6–20)
CO2: 24 mmol/L (ref 22–32)
Calcium: 7.9 mg/dL — ABNORMAL LOW (ref 8.9–10.3)
Chloride: 110 mmol/L (ref 101–111)
Creatinine, Ser: 2.64 mg/dL — ABNORMAL HIGH (ref 0.61–1.24)
GFR calc Af Amer: 26 mL/min — ABNORMAL LOW (ref >60)
GFR calc non Af Amer: 23 mL/min — ABNORMAL LOW (ref >60)
Glucose, Bld: 360 mg/dL — ABNORMAL HIGH (ref 65–99)
Potassium: 3.9 mmol/L (ref 3.5–5.1)
Sodium: 144 mmol/L (ref 135–145)

## 2017-08-06 MED ORDER — INSULIN DETEMIR 100 UNIT/ML ~~LOC~~ SOLN
8.0000 [IU] | Freq: Two times a day (BID) | SUBCUTANEOUS | Status: DC
  Administered 2017-08-06 – 2017-08-07 (×3): 8 [IU] via SUBCUTANEOUS
  Filled 2017-08-06 (×6): qty 0.08

## 2017-08-06 MED ORDER — FENTANYL CITRATE (PF) 100 MCG/2ML IJ SOLN: 25.0000 ug | INTRAMUSCULAR | Status: DC | PRN

## 2017-08-06 MED ORDER — POTASSIUM CHLORIDE 20 MEQ/15ML (10%) PO SOLN
20.0000 meq | Freq: Once | ORAL | Status: AC
Start: 2017-08-06 — End: 2017-08-06
  Administered 2017-08-06: 20 meq
  Filled 2017-08-06: qty 15

## 2017-08-06 MED ORDER — FUROSEMIDE 10 MG/ML IJ SOLN
60.0000 mg | Freq: Once | INTRAMUSCULAR | Status: AC
  Administered 2017-08-06: 60 mg via INTRAVENOUS
  Filled 2017-08-06: qty 6

## 2017-08-06 NOTE — Progress Notes (Signed)
Wasted 115ml Fentanyl with Mahala Menghini RN

## 2017-08-06 NOTE — Progress Notes (Signed)
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 PMHx of DM, dementia, anxiety, depression, CVA.  SUBJECTIVE:  Meets criteria for weaning. Fentanyl stopped. Lasix repeat 10/25. May be extubated .  VITAL SIGNS: BP (!) 112/58   Pulse 64   Temp 98.3 F (36.8 C) (Oral)   Resp 15   Ht 5\' 6"  (1.676 m)   Wt 145 lb 4.5 oz (65.9 kg)   SpO2 100%   BMI 23.45 kg/m   VENTILATOR SETTINGS: Vent Mode: PRVC FiO2 (%):  [30 %] 30 % Set Rate:  [15 bmp] 15 bmp Vt Set:  [510 mL] 510 mL PEEP:  [5 cmH20] 5 cmH20 Plateau Pressure:  [16 cmH20-19 cmH20] 19 cmH20  INTAKE / OUTPUT: I/O last 3 completed shifts: In: 3525.8 [I.V.:1560.8; NG/GT:1815; IV Piggyback:150] Out: 4562 [BWLSL:3734; Stool:1]  Intake/Output Summary (Last 24 hours) at 08/06/17 0825 Last data filed at 08/06/17 0800  Gross per 24 hour  Intake             1952 ml  Output             4531 ml  Net            -2579 ml   PHYSICAL EXAMINATION:  General:  Awake and alert, follows commands. Tolerating PS 5 HEENT: ET/ogt PSY:nl affect Neuro: itact CV: HSr RRR PULM: Decreased in bases KA:JGOT, non-tender, bsx4 active , TF Extremities: warm/dry, + edema  Skin: no rashes or lesions   LABS:  BMET  Recent Labs Lab 08/04/17 0500 08/05/17 0435 08/06/17 0356  NA 142 144 144  K 4.2 4.2 3.9  CL 108 110 110  CO2 24 25 24   BUN 42* 41* 35*  CREATININE 3.86* 3.10* 2.64*  GLUCOSE 83 253* 360*   Electrolytes  Recent Labs Lab 08/03/17 0437 08/03/17 1636 08/04/17 0500 08/05/17 0435 08/06/17 0356  CALCIUM 7.0*  --  7.4* 8.0* 7.9*  MG 2.2 2.1  --  1.9  --   PHOS 4.1 3.8  --  3.6  --     CBC  Recent Labs Lab 08/03/17 0437 08/05/17 0435 08/06/17 0356  WBC 7.9 5.9 7.6  HGB 7.2* 8.0* 7.5*  HCT 21.1* 24.7* 23.5*  PLT 136* 122* 121*   Coag's No results for input(s): APTT, INR in the last 168 hours.  Sepsis Markers  Recent Labs Lab 08/01/17 2100  08/02/17 0514 08/02/17 0926 08/02/17 1403 08/02/17 1812 08/03/17 0437  LATICACIDVEN 6.9*  < >  --  7.2* 2.1* 1.5  --   PROCALCITON 5.14  --  8.78  --   --   --  8.69  < > = values in this interval not displayed. ABG  Recent Labs Lab 08/02/17 0018 08/02/17 0945 08/03/17 0320  PHART 7.407 7.586* 7.421  PCO2ART 22.5* 26.2* 39.0  PO2ART 442* 31.7* 146*   Liver Enzymes  Recent Labs Lab 08/01/17 1608  AST 21  ALT 26  ALKPHOS 105  BILITOT 1.5*  ALBUMIN 3.3*   Cardiac Enzymes  Recent Labs Lab 08/01/17 2100  TROPONINI <0.03   Glucose  Recent Labs Lab 08/05/17 0324 08/05/17 0812 08/05/17 1129 08/05/17 1559 08/05/17 2348 08/06/17 0401  GLUCAP  188* 288* 193* 216* 174* 303*   Imaging Dg Chest Port 1 View  Result Date: 08/06/2017 CLINICAL DATA:  Respiratory failure EXAM: PORTABLE CHEST 1 VIEW COMPARISON:  08/05/2017 FINDINGS: Endotracheal tube, nasogastric catheter and right jugular central line are again noted and stable. Cardiac shadow is stable. Bibasilar atelectatic changes are again seen without significant change. No acute bony abnormality is noted. IMPRESSION: Stable bibasilar atelectasis. Tubes and lines as described. Electronically Signed   By: Inez Catalina M.D.   On: 08/06/2017 07:30     STUDIES:   CULTURES: Blood 10/20 >> Urine 10/21 >> negative Sputum 10/21 >> negative  ANTIBIOTICS: Zosyn 10/20 >> Vancomycin 10/20 >> 10/23  SIGNIFICANT EVENTS: 10/20 Admit 10/23 off insulin gtt  LINES/TUBES: Rt IJ CVL 10/20 >> ETT 10/20 >>  DISCUSSION: 71 yo male with recurrent DKA, VDRF, sepsis, ARF.Underlying dementia. Appears close to being extubated. Repeat lasix with neg I/o and  creatine trending down  ASSESSMENT / PLAN:  DKA >> resolved. Insulin dependent DM. CBG (last 3)   Recent Labs  08/05/17 1559 08/05/17 2348 08/06/17 0401  GLUCAP 216* 174* 303*     - SSI with levemir with dosed doubled 10/25  Acute renal failure with ATN >> baseline creatinine 1.18 from 07/28/17.-> 2.64 10/25 Lab Results  Component Value Date   CREATININE 2.64 (H) 08/06/2017   CREATININE 3.10 (H) 08/05/2017   CREATININE 3.86 (H) 08/04/2017    CKD 2. Hypervolemia. Hyperkalemia >> resolved. Lactic acidosis >> resolved. - KVO IV fluids - lasix 60 mg IV x repeat 10/25 - keep foley in for now to closely monitor I/O's  Compromised airway. - pressure support wean as tolerated >>wean 10/25 -hold iv narcotic - may be ready for extubation 10/25  Sepsis likely from pneumonia. - day 4 of Abx, currently on zosyn, NGTD on cultures 10/25  Hypertension. Improved 10/25 -On norvasc  Anemia of critical illness and chronic disease. Thrombocytopenia in setting of sepsis. - f/u CBC  Acute metabolic encephalopathy. resolved 10/25, awake and follows commands Hx of dementia, depression, anxiety, CVA. - RASS goal 0 -Stop fentanyl 10/25 during weaning  DVT prophylaxis - SQ heparin SUP - protonix Nutrition - tube feeds Goals of care - Full code App CCT 30 min  Richardson Landry Minor ACNP Maryanna Shape PCCM Pager [phone] till 3 pm If no answer page [phone] 08/06/2017, 8:10 AM

## 2017-08-06 NOTE — Progress Notes (Signed)
Pt did not receive 1600 dose of Zosyn due to tubing being clamped. Because next dose due at 0000, the original was not infused so that the two doses would not be given too closely together.

## 2017-08-06 NOTE — Care Management Note (Signed)
Case Management Note  Patient Details  Name: [name] [name] MRN: [id number] Date of Birth: 01-04-1946  Subjective/Objective:   Pt admitted on 08/01/17 with severe DKA, AMS, hyperglycemia.  Pt intubated for airway protection.  PTA, pt lived at home alone; daughter works but checks on pt during the day.  Pt active with [facility] under [place] program ([facility])                 Action/Plan: Pt currently remains intubated.  Will follow for discharge planning as pt progresses.   Expected Discharge Date:                  Expected Discharge Plan:     In-House Referral:     Discharge planning Services  CM Consult  Post Acute Care Choice:    Choice offered to:     DME Arranged:    DME Agency:     HH Arranged:    HH Agency:     Status of Service:  In process, will continue to follow  If discussed at Long Length of Stay Meetings, dates discussed:    Additional Comments:  08/06/17 J. Jaydynn Wolford, RN, BSN Discussed pt's case with [facility] rep.  Per [facility] agency, pt had been active with [facility] agency for about a month.  He became increasingly frustrated with homecare nurses coming to his home, and eventually stopped answering when they called or came to his door.  He has 2 sisters who live nearby and were assisting with care at one point, but per [facility] report had turned on them as well, refusing to let them in his home.   Pt lives alone; [place] questioned if pt may have early dementia.  Will follow up with pt to discuss dc plans now that he is extubated.    Reinaldo Raddle, RN, BSN  Trauma/Neuro ICU Case Manager [phone]

## 2017-08-06 NOTE — Progress Notes (Signed)
Spoke with NP Richardson Landry Minor about removing Foley and [employer]. NP Minor advised to leave in and reassess tomorrow.

## 2017-08-06 NOTE — Procedures (Signed)
Extubation Procedure Note  Patient Details:   Name: [name] [name] DOB: Jun 09, 1946 MRN: [id number]   Airway Documentation:  Airway 8 mm (Active)  Secured at (cm) 22 cm 08/06/2017  8:03 AM  Measured From Lips 08/06/2017  8:03 AM  [place] 08/06/2017  8:03 AM  Secured By [employer] 08/06/2017  8:03 AM  Tube Holder Repositioned Yes 08/06/2017  8:03 AM  Cuff Pressure (cm H2O) 28 cm H2O 08/05/2017 11:57 PM  Site Condition Dry 08/06/2017  8:03 AM    Evaluation  O2 sats: stable throughout Complications: No apparent complications Patient did tolerate procedure well. Bilateral Breath Sounds: Clear, Diminished   Yes   Patient extubated per order to 2L [place] with no apparent complications. Positive cuff leak was noted prior to extubation. Patient is alert and oriented and is able to speak. Vitals are stable and sats are 95%. RT will continue to monitor.   Jude Linck Clyda Greener 08/06/2017, 9:55 AM

## 2017-08-06 NOTE — Progress Notes (Addendum)
Nutrition Follow-up  INTERVENTION:   Monitor for diet advancement  If fails swallow eval recommend Cortrak tube and initiate enteral feedings Glucerna 1.2 @ 60 ml/hr (1440 ml/day) 30 ml Prostat daily Providers: 1828 kcal, 101 grams protein and 1167 ml free water.   NUTRITION DIAGNOSIS:   Inadequate oral intake related to inability to eat as evidenced by NPO status. Ongoing.   GOAL:   Patient will meet greater than or equal to 90% of their needs Not met.   MONITOR:   Diet advancement, I & O's  ASSESSMENT:   Pt with PMH of uncontrolled type II DM, dementia, hx of CVA, depression and anxiety presents with AMS from DKA, intubated 10/20 to protect airway  10/25 extubated Spoke with pt's RN, plan for swallow eval 10/26  Medications reviewed and include: levemir 8 units BID Labs reviewed: glucose 360 CBG's: [phone]   Diet Order:  Diet NPO time specified  Skin:  Reviewed, no issues  Last BM:  10/24 - medium  Height:   Ht Readings from Last 1 Encounters:  08/04/17 '5\' 6"'  (1.676 m)    Weight:   Wt Readings from Last 1 Encounters:  08/06/17 145 lb 4.5 oz (65.9 kg)    Ideal Body Weight:  64.5 kg  BMI:  Body mass index is 23.45 kg/m.  Estimated Nutritional Needs:   Kcal:  1600-1800  Protein:  85-100 grams  Fluid:  >/= 1.6 L/day  EDUCATION NEEDS:   Education needs no appropriate at this time  [place], [place], [place] Pager [phone] After Hours Pager

## 2017-08-07 LAB — PHOSPHORUS: Phosphorus: 3.8 mg/dL (ref 2.5–4.6)

## 2017-08-07 LAB — CBC
HCT: 24 % — ABNORMAL LOW (ref 39.0–52.0)
Hemoglobin: 7.6 g/dL — ABNORMAL LOW (ref 13.0–17.0)
MCH: 29.3 pg (ref 26.0–34.0)
MCHC: 31.7 g/dL (ref 30.0–36.0)
MCV: 92.7 fL (ref 78.0–100.0)
Platelets: 143 10*3/uL — ABNORMAL LOW (ref 150–400)
RBC: 2.59 MIL/uL — ABNORMAL LOW (ref 4.22–5.81)
RDW: 15.3 % (ref 11.5–15.5)
WBC: 6.8 10*3/uL (ref 4.0–10.5)

## 2017-08-07 LAB — BASIC METABOLIC PANEL
Anion gap: 11 (ref 5–15)
BUN: 28 mg/dL — ABNORMAL HIGH (ref 6–20)
CO2: 28 mmol/L (ref 22–32)
Calcium: 8.7 mg/dL — ABNORMAL LOW (ref 8.9–10.3)
Chloride: 110 mmol/L (ref 101–111)
Creatinine, Ser: 2.36 mg/dL — ABNORMAL HIGH (ref 0.61–1.24)
GFR calc Af Amer: 30 mL/min — ABNORMAL LOW (ref >60)
GFR calc non Af Amer: 26 mL/min — ABNORMAL LOW (ref >60)
Glucose, Bld: 98 mg/dL (ref 65–99)
Potassium: 3.5 mmol/L (ref 3.5–5.1)
Sodium: 149 mmol/L — ABNORMAL HIGH (ref 135–145)

## 2017-08-07 LAB — GLUCOSE, CAPILLARY
Glucose-Capillary: 101 mg/dL — ABNORMAL HIGH (ref 65–99)
Glucose-Capillary: 76 mg/dL (ref 65–99)
Glucose-Capillary: 74 mg/dL (ref 65–99)
Glucose-Capillary: 45 mg/dL — ABNORMAL LOW (ref 65–99)
Glucose-Capillary: 93 mg/dL (ref 65–99)
Glucose-Capillary: 74 mg/dL (ref 65–99)

## 2017-08-07 LAB — MAGNESIUM: Magnesium: 1.8 mg/dL (ref 1.7–2.4)

## 2017-08-07 MED ORDER — DEXTROSE 50 % IV SOLN
25.0000 mL | Freq: Once | INTRAVENOUS | Status: AC
  Administered 2017-08-07: 25 mL via INTRAVENOUS
  Filled 2017-08-07: qty 50

## 2017-08-07 MED ORDER — AMLODIPINE BESYLATE 10 MG PO TABS: 10.0000 mg | ORAL_TABLET | Freq: Every day | ORAL | Status: DC

## 2017-08-07 MED ORDER — DEXTROSE-NACL 5-0.45 % IV SOLN
INTRAVENOUS | Status: DC
  Administered 2017-08-07: 10:00:00 via INTRAVENOUS

## 2017-08-07 NOTE — Evaluation (Signed)
Clinical/Bedside Swallow Evaluation Patient Details  Name: [name] [name] MRN: [id number] Date of Birth: 1946/08/15  Today's Date: 08/07/2017 Time: SLP Start Time (ACUTE ONLY): 0930 SLP Stop Time (ACUTE ONLY): 0945 SLP Time Calculation (min) (ACUTE ONLY): 15 min  Past Medical History:  Past Medical History:  Diagnosis Date  . 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 Closed comminuted intertrochanteric fracture of left femur ([place])   . Diabetes mellitus without complication ([place])   . Diabetic hyperosmolar non-ketotic state ([place]) 05/15/2016  . DKA (diabetic ketoacidoses) ([place]) 05/09/2016  . Hypertension   . Postoperative anemia due to acute blood loss 06/18/2016  . Retroperitoneal hematoma 06/18/2016  . Stroke ([place])   . Vitamin B 12 deficiency 06/18/2016   Past Surgical History:  Past Surgical History:  Procedure Laterality Date  . HIP ARTHROPLASTY Right 02/05/2013   Procedure: ARTHROPLASTY BIPOLAR HIP;  Surgeon: [name], MD;  Location: [facility];  Service: Orthopedics;  Laterality: Right;  . 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 PMHx of DM, dementia, anxiety, depression, CVA.  ETT 10/20-10/25.    Assessment / Plan / Recommendation Clinical Impression  Pt presents with s/s of a post-extubation dysphagia with wet, weak cough elicited following all trials of ice chips and water.  Poor respiratory support for speech, producing max of three words per exhalation.  Aspiration risk is high, due likely to laryngeal trauma.  Pt not ready for POs - SLP will f/u next date for improved readiness; will likely need instrumental swallow study.   Anticipate rapid improvements. D/W RN.  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 PMHx of DM, dementia, anxiety, depression, CVA.  ETT 10/20-10/25.  Type of Study: Bedside Swallow Evaluation Diet Prior to this Study: NPO Temperature Spikes Noted: No Respiratory Status: Room air History of Recent Intubation: Yes Length of Intubations (days): 5 days Date extubated: 08/06/17 Behavior/Cognition: Alert;Cooperative Oral Cavity Assessment: Within Functional Limits Oral Care Completed by SLP: No Oral Cavity - Dentition: Missing dentition Vision: Functional for self-feeding Self-Feeding Abilities: Needs assist Patient Positioning: Upright in bed Baseline Vocal Quality: Hoarse Volitional Cough: Weak Volitional Swallow: Able to elicit    Oral/Motor/Sensory Function Overall Oral Motor/Sensory Function: Within functional limits   Ice Chips Ice chips: Impaired Presentation: Spoon Pharyngeal Phase Impairments: Wet Vocal Quality;Cough - Immediate   Thin Liquid Thin Liquid: Impaired Presentation: Spoon Pharyngeal  Phase Impairments: Wet Vocal Quality;Cough - Immediate    Nectar Thick Nectar Thick Liquid: Not tested   Honey Thick Honey Thick Liquid: Not tested   Puree Puree: Not tested   Solid   GO   Solid: Not tested  [name] [name] 08/07/2017,9:47 AM

## 2017-08-07 NOTE — Progress Notes (Signed)
Repoprt received from [place]. Pt arrived to the unit at 1650.

## 2017-08-07 NOTE — Evaluation (Signed)
Occupational Therapy Evaluation Patient Details Name: [name] [name] MRN: [id number] DOB: June 12, 1946 Today's Date: 08/07/2017    History of Present Illness 71 y.o. male admitted to [facility] on 08/01/17 for severe DKA, AMS, hyperglycemia, lactic acidosis, intubated 08/01/17-08/06/17.  Pt with other significant PMH of stroke, HTN, DM, L femur fx s/p IM nail, and R THA.     Clinical Impression   This 71 yo male admitted with above presents to acute OT with deficits below (see OT problem list) thus affecting per what he reports as being totally independent at home for basic ADLs and doing some cooking. He will benefit from acute OT with follow up OT at SNF, unless he has 24 hour s/prn A then could go home with [place].     Follow Up Recommendations  SNF;Supervision/Assistance - 24 hour;Other (comment) (pt not safe to go home alone, needs therapy and 24 hour care)  3n1 and tub seat (if he does not already have)         Precautions / Restrictions Precautions Precautions: Fall Restrictions Weight Bearing Restrictions: No      Mobility Bed Mobility Overal bed mobility: Needs Assistance Bed Mobility: Supine to Sit;Sit to Supine     Supine to sit: Supervision;HOB elevated Sit to supine: Supervision;HOB elevated      Transfers Overall transfer level: Needs assistance Equipment used: Rolling walker (2 wheeled) Transfers: Sit to/from Stand Sit to Stand: Min assist         General transfer comment: without RW pt could not fully get his balance upright, reaching for IV pole    Balance Overall balance assessment: Needs assistance Sitting-balance support: No upper extremity supported;Feet supported Sitting balance-Leahy Scale: Good     Standing balance support: Bilateral upper extremity supported;During functional activity Standing balance-Leahy Scale: Poor Standing balance comment: utilized RW for UE support                           ADL either performed or assessed with  clinical judgement   ADL Overall ADL's : Needs assistance/impaired Eating/Feeding: NPO   Grooming: Supervision/safety;Set up;Sitting (EOB)   Upper Body Bathing: Supervision/ safety;Set up;Sitting (EOB)   Lower Body Bathing: Moderate assistance Lower Body Bathing Details (indicate cue type and reason): min A sit<>stand Upper Body Dressing : Set up;Supervision/safety;Sitting (EOB)   Lower Body Dressing: Moderate assistance Lower Body Dressing Details (indicate cue type and reason): min A sit<>stand Toilet Transfer: Minimal assistance;Ambulation;RW Toilet Transfer Details (indicate cue type and reason): bed>out door and back to bed (80 feet) Toileting- Clothing Manipulation and Hygiene: Moderate assistance Toileting - Clothing Manipulation Details (indicate cue type and reason): min A sit<>stand             Vision Patient Visual Report: No change from baseline              Pertinent Vitals/Pain Pain Assessment: No/denies pain     Hand Dominance Right   Extremity/Trunk Assessment Upper Extremity Assessment Upper Extremity Assessment: Overall WFL for tasks assessed           Communication Communication Communication:  (does have hoarse voice --likely due to intubation)   Cognition Arousal/Alertness: Awake/alert Behavior During Therapy: WFL for tasks assessed/performed Overall Cognitive Status: History of cognitive impairments - at baseline Area of Impairment: Safety/judgement                         Safety/Judgement: Decreased awareness of safety;Decreased awareness of deficits  General Comments: when sitting EOB pt decided he was ready to lay down without regards to lines and tubes--pulled condom cath off and got very upset due to then his bed and gown got wet              Home Living Family/patient expects to be discharged to:: Skilled nursing facility Living Arrangements: Alone Available Help at Discharge: Family;Available  PRN/intermittently Type of Home: Apartment Home Access: Elevator     Home Layout: One level     Bathroom Shower/Tub: Occupational psychologist: Standard     Home Equipment: Environmental consultant - 2 wheels          Prior Functioning/Environment Level of Independence: Independent with assistive device(s)        Comments: Per daughter he uses a RW, per pt he does not.         OT Problem List: Impaired balance (sitting and/or standing);Decreased cognition;Decreased safety awareness      OT Treatment/Interventions: Self-care/ADL training;Therapeutic activities;DME and/or AE instruction;Patient/family education;Balance training    OT Goals(Current goals can be found in the care plan section) Acute Rehab OT Goals Patient Stated Goal: to get something to eat OT Goal Formulation: With patient Time For Goal Achievement: 08/21/17 Potential to Achieve Goals: Good  OT Frequency: Min 2X/week   Barriers to D/C: Decreased caregiver support             AM-PAC PT "6 Clicks" Daily Activity     Outcome Measure Help from another person eating meals?: Total (NPO) Help from another person taking care of personal grooming?: A Little Help from another person toileting, which includes using toliet, bedpan, or urinal?: A Lot Help from another person bathing (including washing, rinsing, drying)?: A Lot Help from another person to put on and taking off regular upper body clothing?: A Little Help from another person to put on and taking off regular lower body clothing?: A Lot 6 Click Score: 13   End of Session Equipment Utilized During Treatment: Gait belt;Rolling walker Nurse Communication:  (sats 97% on RA--RN (Matt) OK'd to leave O2 off)  Activity Tolerance: Patient tolerated treatment well Patient left: in bed;with call bell/phone within reach;with bed alarm set  OT Visit Diagnosis: Unsteadiness on feet (R26.81);Muscle weakness (generalized) (M62.81);Other symptoms and signs involving  cognitive function                Time: 1459-1521 OT Time Calculation (min): 22 min Charges:  OT General Charges $OT Visit: 1 Visit OT Evaluation $OT Eval Moderate Complexity: 1 Mod

## 2017-08-07 NOTE — Progress Notes (Signed)
Milton Sagona is a 71 y.o. male patient is transfer from [place], awake, alert - oriented  X 4 - no acute distress noted.  VSS - Blood pressure (!) 144/68, pulse (!) 56, temperature 99 F (37.2 C), temperature source Oral, resp. rate 20, height 5\' 6"  (1.676 m), weight 65.9 kg (145 lb 4.5 oz), SpO2 95 %.    IV in place, occlusive dsg intact without redness.  Orientation to room, and floor completed with information packet given to patient/family.  Patient declined safety video at this time.  Admission INP armband ID verified with patient/family, and in place.   SR up x 2, fall assessment complete, with patient and family able to verbalize understanding of risk associated with falls, and verbalized understanding to call nsg before up out of bed.  Call light within reach, patient able to voice, and demonstrate understanding.  Skin, clean-dry- intact with evidence of bruising on arm and wrist.   Will cont to eval and treat per MD orders.  Dorris Carnes, RN 08/07/2017 4:55 PM

## 2017-08-07 NOTE — Progress Notes (Signed)
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 PMHx of DM, dementia, anxiety, depression, CVA.  SUBJECTIVE:  Feels hoarse in throat.  Denies chest pain, abdominal pain.  Breathing okay.  VITAL SIGNS: BP 113/71   Pulse 60   Temp 98.7 F (37.1 C) (Oral)   Resp 18   Ht 5\' 6"  (1.676 m)   Wt 145 lb 4.5 oz (65.9 kg)   SpO2 99%   BMI 23.45 kg/m   INTAKE / OUTPUT: I/O last 3 completed shifts: In: 1475.3 [I.V.:519; NG/GT:756.3; IV Piggyback:200] Out: 3419 [Urine:4080; Stool:1]  PHYSICAL EXAMINATION:  General - pleasant Eyes - pupils reactive ENT - no sinus tenderness, no oral exudate, no LAN, raspy voice Cardiac - regular, no murmur Chest - no wheeze, rales Abd - soft, non tender Ext - no edema Skin - no rashes Neuro - normal strength Psych - normal mood  LABS:  BMET  Recent Labs Lab 08/05/17 0435 08/06/17 0356 08/07/17 0500  NA 144 144 149*  K 4.2 3.9 3.5  CL 110 110 110  CO2 25 24 28   BUN 41* 35* 28*  CREATININE 3.10* 2.64* 2.36*  GLUCOSE 253* 360* 98   Electrolytes  Recent Labs Lab 08/03/17 1636  08/05/17 0435 08/06/17 0356 08/07/17 0500  CALCIUM  --   < > 8.0* 7.9* 8.7*  MG 2.1  --  1.9  --  1.8  PHOS 3.8  --  3.6  --  3.8  < > = values in this interval not displayed. CBC  Recent Labs Lab 08/05/17 0435 08/06/17 0356 08/07/17 0500  WBC 5.9 7.6 6.8  HGB 8.0* 7.5* 7.6*  HCT 24.7* 23.5* 24.0*  PLT 122* 121* 143*   Coag's No results for input(s): APTT, INR in the last 168 hours.  Sepsis Markers  Recent Labs Lab 08/01/17 2100  08/02/17 0514 08/02/17 0926 08/02/17 1403 08/02/17 1812 08/03/17 0437   LATICACIDVEN 6.9*  < >  --  7.2* 2.1* 1.5  --   PROCALCITON 5.14  --  8.78  --   --   --  8.69  < > = values in this interval not displayed. ABG  Recent Labs Lab 08/02/17 0018 08/02/17 0945 08/03/17 0320  PHART 7.407 7.586* 7.421  PCO2ART 22.5* 26.2* 39.0  PO2ART 442* 31.7* 146*   Liver Enzymes  Recent Labs Lab 08/01/17 1608  AST 21  ALT 26  ALKPHOS 105  BILITOT 1.5*  ALBUMIN 3.3*   Cardiac Enzymes  Recent Labs Lab 08/01/17 2100  TROPONINI <0.03   Glucose  Recent Labs Lab 08/06/17 1254 08/06/17 1637 08/06/17 1953 08/06/17 2344 08/07/17 0333 08/07/17 0755  GLUCAP 259* 129* 80 104* 101* 76   Imaging No results found.   STUDIES:   CULTURES: Blood 10/20 >> negative Urine 10/21 >> negative Sputum 10/21 >> negative  ANTIBIOTICS: Zosyn 10/20 >> Vancomycin 10/20 >> 10/23  SIGNIFICANT EVENTS: 10/20 Admit 10/23 off insulin gtt 10/27 Transfer to floor bed  LINES/TUBES: Rt IJ CVL 10/20 >> 10/26 ETT 10/20 >>10/25  DISCUSSION: 71 yo male with recurrent DKA, VDRF, sepsis, ARF.  Successfully extubated 10/25.  Need to start mobilizing and assess swallowing.  ASSESSMENT / PLAN:  DKA >> resolved. Insulin dependent DM. - continue SSI with levemir - add D5 1/2 NS at 30 ml/hr until able to eat  Acute renal failure with ATN >> baseline creatinine 1.18 from 07/28/17. CKD 2. Hypervolemia >> improved. Hyperkalemia >> resolved. Lactic acidosis >> resolved. - even fluid balance >> hold additional lasix 10/26 - f/u BMET - d/c foley  Compromised airway. - monitor respiratory status  Sepsis likely from pneumonia. - day 5/7 of zosyn  Hypertension.  - resume norvasc one he is able to swallow pills  Anemia of critical illness and chronic disease. Thrombocytopenia in setting of sepsis. - f/u CBC  Acute metabolic encephalopathy. resolved 10/25, awake and follows commands Hx of dementia, depression, anxiety, CVA. - monitor mental  status  Dysphagia. - f/u speech therapy  Deconditioning. - PT assessment.  DVT prophylaxis - SQ heparin SUP - d/c protonix 10/26 >> SUP no longer indicated Nutrition - NPO Goals of care - Full code  Transfer to floor bed 10/26 >> To [facility] 10/27 and PCCM off.  Chesley Mires, MD [facility] Pulmonary/Critical Care 08/07/2017, 8:20 AM Pager:  [phone] After 3pm call: [phone]

## 2017-08-07 NOTE — Evaluation (Signed)
Physical Therapy Evaluation Patient Details Name: [name] [name] MRN: [id number] DOB: 1946/01/10 Today's Date: 08/07/2017   History of Present Illness  71 y.o. male admitted to [facility] on 08/01/17 for severe DKA, AMS, hyperglycemia, lactic acidosis, intubated 08/01/17-08/06/17.  Pt with other significant PMH of stroke, HTN, DM, L femur fx s/p IM nail, and R THA.    Clinical Impression  Pt is weak and generally debilitated from this most recent episode.  He is hopeful to improve to the point he can go home (and daughter says even if he doesn't he would likely refuse SNF placement) at discharge.  He is currently SNF appropriate, but may progress enough to go home with max [facility] services depending on how long he is here and how much he is mobilized regularly by staff and therapy.   PT to follow acutely for deficits listed below.     Follow Up Recommendations SNF;Supervision for mobility/OOB    Equipment Recommendations  None recommended by PT    Recommendations for Other Services OT consult     Precautions / Restrictions Precautions Precautions: Fall      Mobility  Bed Mobility Overal bed mobility: Needs Assistance Bed Mobility: Supine to Sit     Supine to sit: Min assist     General bed mobility comments: Min assist to support trunk to get to sitting with mattress deflated.   Transfers Overall transfer level: Needs assistance Equipment used: Rolling walker (2 wheeled) Transfers: Sit to/from Stand Sit to Stand: Mod assist         General transfer comment: Mod assist to support trunk during transition to stand and to stabilize RW during transition of hands.  Verbal and manual cues to put hands up on RW handles.   Ambulation/Gait Ambulation/Gait assistance: Min assist Ambulation Distance (Feet): 65 Feet Assistive device: Rolling walker (2 wheeled) Gait Pattern/deviations: Step-through pattern;Shuffle;Trunk flexed Gait velocity: decreased Gait velocity interpretation: Below normal  speed for age/gender General Gait Details: Pt with heavy min assist at trunk for balance, verbal cues for upright posture, but likely walks flexed at baseline.  Several coughing spells during gait resulting in temporary decrease in O2 sats to 85% on RA.  Otherwise, >90 on RA for the rest of gait and at rest.          Balance Overall balance assessment: Needs assistance Sitting-balance support: Feet supported;Bilateral upper extremity supported Sitting balance-Leahy Scale: Fair     Standing balance support: Bilateral upper extremity supported Standing balance-Leahy Scale: Poor                               Pertinent Vitals/Pain Pain Assessment: No/denies pain    Home Living Family/patient expects to be discharged to:: Private residence Living Arrangements: Alone Available Help at Discharge: Family;Available PRN/intermittently Type of Home: Apartment Home Access: Elevator     Home Layout: One level Home Equipment: Walker - 2 wheels      Prior Function Level of Independence: Independent with assistive device(s)         Comments: Per daughter he uses a RW, per pt he does not.      Hand Dominance   Dominant Hand: Right    Extremity/Trunk Assessment   Upper Extremity Assessment Upper Extremity Assessment: Defer to OT evaluation    Lower Extremity Assessment Lower Extremity Assessment: Generalized weakness (at least 3/5 per gross seated MMT)    Cervical / Trunk Assessment Cervical / Trunk Assessment: Normal  Communication   Communication: No difficulties (horse voice quality likely due to intubation)  Cognition Arousal/Alertness: Awake/alert Behavior During Therapy: WFL for tasks assessed/performed Overall Cognitive Status: History of cognitive impairments - at baseline Area of Impairment: Memory;Orientation;Attention;Following commands;Safety/judgement;Awareness;Problem solving                 Orientation Level: Disoriented  to;Time Current Attention Level: Sustained Memory: Decreased short-term memory Following Commands: Follows one step commands consistently Safety/Judgement: Decreased awareness of safety;Decreased awareness of deficits Awareness: Intellectual Problem Solving: Slow processing;Difficulty sequencing;Requires verbal cues;Requires tactile cues General Comments: Pt seems to have some baseline cognitive deficits.  He was extremely emotional when daughter walked into the room (very emotionally labile) and throughout the rest of the session.              Assessment/Plan    PT Assessment Patient needs continued PT services  PT Problem List Decreased strength;Decreased activity tolerance;Decreased balance;Decreased mobility;Decreased cognition;Decreased knowledge of use of DME;Decreased safety awareness;Cardiopulmonary status limiting activity       PT Treatment Interventions DME instruction;Gait training;Functional mobility training;Therapeutic activities;Therapeutic exercise;Neuromuscular re-education;Balance training;Cognitive remediation;Patient/family education    PT Goals (Current goals can be found in the Care Plan section)  Acute Rehab PT Goals Patient Stated Goal: to go home PT Goal Formulation: With patient/family Time For Goal Achievement: 08/21/17 Potential to Achieve Goals: Good    Frequency Min 3X/week   Barriers to discharge Decreased caregiver support pt lives alone, daughter checks on him daily, however she works.         AM-PAC PT "6 Clicks" Daily Activity  Outcome Measure Difficulty turning over in bed (including adjusting bedclothes, sheets and blankets)?: Unable Difficulty moving from lying on back to sitting on the side of the bed? : Unable Difficulty sitting down on and standing up from a chair with arms (e.g., wheelchair, bedside commode, etc,.)?: Unable Help needed moving to and from a bed to chair (including a wheelchair)?: A Little Help needed walking in  hospital room?: A Little Help needed climbing 3-5 steps with a railing? : A Lot 6 Click Score: 11    End of Session Equipment Utilized During Treatment: Gait belt Activity Tolerance: Patient limited by fatigue Patient left: in chair;with call bell/phone within reach;with chair alarm set   PT Visit Diagnosis: Unsteadiness on feet (R26.81);Muscle weakness (generalized) (M62.81);Difficulty in walking, not elsewhere classified (R26.2)    Time: 4403-4742 PT Time Calculation (min) (ACUTE ONLY): 26 min   Charges:         Wells Guiles B. Terrilyn Tyner, PT, DPT [phone]   PT Evaluation $PT Eval Moderate Complexity: 1 Mod PT Treatments $Gait Training: 8-22 mins   08/07/2017, 11:37 AM

## 2017-08-08 ENCOUNTER — Inpatient Hospital Stay (HOSPITAL_COMMUNITY): Payer: Medicare Other

## 2017-08-08 LAB — CBC
HCT: 24.8 % — ABNORMAL LOW (ref 39.0–52.0)
Hemoglobin: 7.9 g/dL — ABNORMAL LOW (ref 13.0–17.0)
MCH: 28.9 pg (ref 26.0–34.0)
MCHC: 31.9 g/dL (ref 30.0–36.0)
MCV: 90.8 fL (ref 78.0–100.0)
Platelets: 184 10*3/uL (ref 150–400)
RBC: 2.73 MIL/uL — ABNORMAL LOW (ref 4.22–5.81)
RDW: 15.1 % (ref 11.5–15.5)
WBC: 6.9 10*3/uL (ref 4.0–10.5)

## 2017-08-08 LAB — GLUCOSE, CAPILLARY
Glucose-Capillary: 132 mg/dL — ABNORMAL HIGH (ref 65–99)
Glucose-Capillary: 128 mg/dL — ABNORMAL HIGH (ref 65–99)
Glucose-Capillary: 213 mg/dL — ABNORMAL HIGH (ref 65–99)
Glucose-Capillary: 258 mg/dL — ABNORMAL HIGH (ref 65–99)
Glucose-Capillary: 198 mg/dL — ABNORMAL HIGH (ref 65–99)
Glucose-Capillary: 244 mg/dL — ABNORMAL HIGH (ref 65–99)

## 2017-08-08 LAB — BASIC METABOLIC PANEL
Anion gap: 14 (ref 5–15)
BUN: 22 mg/dL — ABNORMAL HIGH (ref 6–20)
CO2: 18 mmol/L — ABNORMAL LOW (ref 22–32)
Calcium: 8.4 mg/dL — ABNORMAL LOW (ref 8.9–10.3)
Chloride: 115 mmol/L — ABNORMAL HIGH (ref 101–111)
Creatinine, Ser: 2.2 mg/dL — ABNORMAL HIGH (ref 0.61–1.24)
GFR calc Af Amer: 33 mL/min — ABNORMAL LOW (ref >60)
GFR calc non Af Amer: 28 mL/min — ABNORMAL LOW (ref >60)
Glucose, Bld: 309 mg/dL — ABNORMAL HIGH (ref 65–99)
Potassium: 3.5 mmol/L (ref 3.5–5.1)
Sodium: 147 mmol/L — ABNORMAL HIGH (ref 135–145)

## 2017-08-08 MED ORDER — ZOLPIDEM TARTRATE 5 MG PO TABS
5.0000 mg | ORAL_TABLET | Freq: Once | ORAL | Status: AC
  Administered 2017-08-09: 5 mg via ORAL
  Filled 2017-08-08: qty 1

## 2017-08-08 MED ORDER — DEXTROSE 5 % IV SOLN
INTRAVENOUS | Status: DC
Start: 2017-08-08 — End: 2017-08-10
  Administered 2017-08-08: 18:00:00 via INTRAVENOUS

## 2017-08-08 MED ORDER — ASPIRIN 300 MG RE SUPP
300.0000 mg | Freq: Every day | RECTAL | Status: DC
  Administered 2017-08-09: 300 mg via RECTAL
  Filled 2017-08-08: qty 1

## 2017-08-08 MED ORDER — INSULIN DETEMIR 100 UNIT/ML ~~LOC~~ SOLN: 6.0000 [IU] | Freq: Every day | SUBCUTANEOUS | Status: DC

## 2017-08-08 MED ORDER — HYDRALAZINE HCL 20 MG/ML IJ SOLN
10.0000 mg | INTRAMUSCULAR | Status: DC | PRN
  Administered 2017-08-12: 10 mg via INTRAVENOUS
  Filled 2017-08-08: qty 1

## 2017-08-08 MED ORDER — NITROGLYCERIN 0.4 MG/HR TD PT24
0.4000 mg | MEDICATED_PATCH | Freq: Every day | TRANSDERMAL | Status: DC
  Administered 2017-08-08 – 2017-08-13 (×6): 0.4 mg via TRANSDERMAL
  Filled 2017-08-08 (×6): qty 1

## 2017-08-08 MED ORDER — SODIUM CHLORIDE 0.45 % IV SOLN: INTRAVENOUS | Status: DC

## 2017-08-08 NOTE — Progress Notes (Signed)
Cortrak team in room to place NG tube, Pt refused placement. Pt A&O x 4, competent to refuse. MD paged and aware. Will continue to keep Pt NPO with ice chips and monitor swallowing.

## 2017-08-08 NOTE — Progress Notes (Signed)
  Speech Language Pathology Treatment: Dysphagia  Patient Details Name: [name] [name] MRN: [id number] DOB: September 20, 1946 Today's Date: 08/08/2017 Time: 4619-0122 SLP Time Calculation (min) (ACUTE ONLY): 10 min  Assessment / Plan / Recommendation Clinical Impression  Pt seen for follow-up for post-extubation dysphagia to determine readiness for instrumental exam. Pt hoarse, requesting water. SLP assisted pt with performing oral care prior to administering diagnostic trials of ice chips, thin and nectar thick water, puree solids. Pt presents with subtle throat clearing with ice chips. With liquids, pt with multiple swallows, immediate coughing, suggestive of reduced airway protection. Suspect delayed swallow initiation. With pureed solids, pt initially tolerates with no overt signs of aspiration, however subtle delayed cough noted. Will proceed with instrumental testing to determine readiness for diet initiation vs. possible need for temporary alternative means of nutrition. MBS to be performed later this morning. Pt, RN in agreement.     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 PMHx of DM, dementia, anxiety, depression, CVA.  ETT 10/20-10/25.       SLP Plan  MBS;New goals to be determined pending instrumental study       Recommendations  Diet recommendations: NPO Medication Administration: Via alternative means                Oral Care Recommendations: Oral care QID Follow up Recommendations: Other (comment) (tba) SLP Visit Diagnosis: Dysphagia, unspecified (R13.10) Plan: MBS;New goals to be determined pending instrumental study       [place], [place], [place] Pathologist [phone]  Aliene Altes 08/08/2017, 8:28 AM

## 2017-08-08 NOTE — Progress Notes (Signed)
Modified Barium Swallow Progress Note  Patient Details  Name: [name] [name] MRN: [id number] Date of Birth: July 25, 1946  Today's Date: 08/08/2017  Modified Barium Swallow completed.  Full report located under Chart Review in the Imaging Section.  Brief recommendations include the following:  Clinical Impression  Patient presents with mild oral and moderate-severe pharyngeal dysphagia s/p intubation which is likely acute, reversible. Oral stage likely impacted by xerostomia, dentition; prolonged bolus formation, delayed anterior to posterior transit greater with solids, thicker consistencies. Pt has delayed swallow initiation (pyriform sinuses with liquids) resulting in delayed closure of the laryngeal vestibule with penetration/aspiration occuring before the swallow. Penetration/aspiration also occurs after the swallow due decreased tongue base retraction, hyolaryngeal excursion and pharyngeal constriction which result in poor stripping of residue from the pharynx. Pt did not initially sense penetration or aspiration; he did ultimately have a response to larger amounts of aspiration with a delay, but neither volitional cough response nor cued coughs/throat clears were effective in ejecting aspiration/penetration. Given residue pattern along the posterior pharyngeal wall, suspect secretions also impacting swallow function. Trialed chin tuck which minimally reduced overall residue, but did not improve pt's airway protection. MD paged; recommend temporary means of nutrition and pt to remain NPO with exception of a few ice chips after oral care to facilitate use of swallowing musculature and promote hydration of mucosa. Will follow for interventions to improve swallow function, readiness to repeat instrumental. FEES recommended for better visualization of secretions, structures given intubation.   Swallow Evaluation Recommendations       SLP Diet Recommendations: NPO;Ice chips PRN after oral care   Liquid  Administration via: Spoon   Medication Administration: Via alternative means               Oral Care Recommendations: Oral care QID   Other Recommendations: Remove water pitcher;Have oral suction available;Prohibited food (jello, ice cream, thin soups)  [name] Lever, MS, [employer] Speech-Language Pathologist [phone]  Aliene Altes 08/08/2017,1:35 PM

## 2017-08-08 NOTE — Progress Notes (Signed)
RN called to room because patient became combative and stated he wants water.  Patient pushed nurse tech away and refused to lay back down until given water.  Patient is NPO because of failing swallow test and RN reinforced the dangers of eating and drinking if unable to swallow properly. Patient is alert and oriented and understands the risks of aspiration with eating and drinking.  RN called Dr. Maudie Mercury and made him aware that patient wants to drink and eat and patient becoming combative because he is frustrated at being NPO.  Dr. Maudie Mercury states patient must remain NPO, but he will prescribe Ambien to help patient sleep and patient may take Ambien with a sip of water.  RN explained this to patient and patient agreeable at this time.  RN will continue to monitor patient and report any problems to MD immediately.  P.J. Linus Mako, RN

## 2017-08-08 NOTE — Progress Notes (Signed)
Cortrak Tube Team Note:  Consult received to place a Cortrak feeding tube.   On RD arrival, patient willfully declines to accept tube. Tells RD repeatedly to "get out of here" RD explained need to tube to receive nutrition, but still not agreeable. Says he wont do it.    RD spoke with RN who called MD. Patient is competent and has right to refuse.RD explained even if he refuses his diet will remain NPO.  He says he will go home and eat.   RD will d/c consult. If patient becomes agreeable, please re consult, however, service is not available on Sundays. Nursing would need to place NGT at bedside or IR would need to place small bore tube if needed prior to service restarting Monday.   Burtis Junes RD, LDN, CNSC Clinical Nutrition Pager: [phone] 08/08/2017 2:40 PM

## 2017-08-08 NOTE — Procedures (Signed)
Objective Swallowing Evaluation: Type of Study: MBS-Modified Barium Swallow Study  Patient Details  Name: [name] [name] MRN: [id number] Date of Birth: [birth year]  Today's Date: 08/08/2017 Time: SLP Start Time (ACUTE ONLY): 1102-SLP Stop Time (ACUTE ONLY): 1120 SLP Time Calculation (min) (ACUTE ONLY): 18 min  Past Medical History:  Past Medical History:  Diagnosis Date  . 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 Closed comminuted intertrochanteric fracture of left femur ([place])   . Diabetes mellitus without complication ([place])   . Diabetic hyperosmolar non-ketotic state ([place]) 05/15/2016  . DKA (diabetic ketoacidoses) ([place]) 05/09/2016  . Hypertension   . Postoperative anemia due to acute blood loss 06/18/2016  . Retroperitoneal hematoma 06/18/2016  . Stroke ([place])   . Vitamin B 12 deficiency 06/18/2016   Past Surgical History:  Past Surgical History:  Procedure Laterality Date  . HIP ARTHROPLASTY Right 02/05/2013   Procedure: ARTHROPLASTY BIPOLAR HIP;  Surgeon: [name], MD;  Location: [facility];  Service: Orthopedics;  Laterality: Right;  . INTRAMEDULLARY (IM) NAIL INTERTROCHANTERIC Left 06/16/2016   Procedure: INTRAMEDULLARY (IM) NAIL INTERTROCHANTRIC;  Surgeon: Rod Can, MD;  Location: [facility];  Service: Orthopedics;  Laterality: Left;   HPI: 71 yo male with brittle diabetes presented with altered mental status from DKA, lactic acidosis, VDRF with compromised airway, and sepsis. PMHx of DM, dementia, anxiety, depression, CVA.  ETT 10/20-10/25.   Subjective: pleasant, cooperative   Assessment / Plan / Recommendation  CHL IP CLINICAL IMPRESSIONS 08/08/2017  Clinical Impression Patient presents with mild oral and moderate-severe pharyngeal dysphagia s/p intubation which is likely acute, reversible. Oral stage likely impacted by xerostomia, dentition; prolonged bolus formation, delayed anterior to posterior transit greater with solids,  thicker consistencies. Pt has delayed swallow initiation (pyriform sinuses with liquids) resulting in delayed closure of the laryngeal vestibule with penetration/aspiration occuring before the swallow. Penetration/aspiration also occurs after the swallow due decreased tongue base retraction, hyolaryngeal excursion and pharyngeal constriction which result in poor stripping of residue from the pharynx. Pt did not initially sense penetration or aspiration; he did ultimately have a response to larger amounts of aspiration with a delay, but neither volitional cough response nor cued coughs/throat clears were effective in ejecting aspiration/penetration. Given residue pattern along the posterior pharyngeal wall, suspect secretions also impacting swallow function. Trialed chin tuck which minimally reduced overall residue, but did not improve pt's airway protection. MD paged; recommend temporary means of nutrition and pt to remain NPO with exception of a few ice chips after oral care to facilitate use of swallowing musculature and promote hydration of mucosa. Will follow for interventions to improve swallow function, readiness to repeat instrumental. FEES recommended for better visualization of secretions, structures given intubation.  SLP Visit Diagnosis Dysphagia, oropharyngeal phase (R13.12)  Attention and concentration deficit following --  Frontal lobe and executive function deficit following --  Impact on safety and function Severe aspiration risk;Risk for inadequate nutrition/hydration      CHL IP TREATMENT RECOMMENDATION 08/08/2017  Treatment Recommendations Therapy as outlined in treatment plan below;F/U FEES in --- days (Comment)     Prognosis 08/08/2017  Prognosis for Safe Diet Advancement Good  Barriers to Reach Goals --  Barriers/Prognosis Comment --    CHL IP DIET RECOMMENDATION 08/08/2017  SLP Diet Recommendations NPO;Ice chips PRN after oral care  Liquid Administration via Spoon   Medication Administration Via alternative means  Compensations --  Postural Changes --      CHL IP OTHER RECOMMENDATIONS 08/08/2017  Recommended Consults --  Oral Care Recommendations Oral care QID  Other Recommendations Remove water pitcher;Have oral suction available;Prohibited food (jello, ice cream, thin soups)      CHL IP FOLLOW UP RECOMMENDATIONS 08/08/2017  Follow up Recommendations Other (comment)      CHL IP FREQUENCY AND DURATION 08/08/2017  Speech Therapy Frequency (ACUTE ONLY) min 3x week  Treatment Duration 2 weeks           CHL IP ORAL PHASE 08/08/2017  Oral Phase Impaired  Oral - Pudding Teaspoon --  Oral - Pudding Cup --  Oral - Honey Teaspoon --  Oral - Honey Cup --  Oral - Nectar Teaspoon --  Oral - Nectar Cup --  Oral - Nectar Straw --  Oral - Thin Teaspoon --  Oral - Thin Cup --  Oral - Thin Straw --  Oral - Puree --  Oral - Mech Soft --  Oral - Regular --  Oral - Multi-Consistency --  Oral - Pill --  Oral Phase - Comment --    CHL IP PHARYNGEAL PHASE 08/08/2017  Pharyngeal Phase Impaired  Pharyngeal- Pudding Teaspoon --  Pharyngeal --  Pharyngeal- Pudding Cup --  Pharyngeal --  Pharyngeal- Honey Teaspoon Delayed swallow initiation-pyriform sinuses;Reduced airway/laryngeal closure;Reduced laryngeal elevation;Reduced epiglottic inversion;Reduced tongue base retraction;Penetration/Aspiration before swallow;Penetration/Apiration after swallow;Significant aspiration (Amount);Pharyngeal residue - valleculae;Pharyngeal residue - posterior pharnyx;Pharyngeal residue - pyriform;Reduced pharyngeal peristalsis  Pharyngeal Material enters airway, passes BELOW cords without attempt by patient to eject out (silent aspiration);Material enters airway, passes BELOW cords and not ejected out despite cough attempt by patient  Pharyngeal- Honey Cup --  Pharyngeal --  Pharyngeal- Nectar Teaspoon --  Pharyngeal --  Pharyngeal- Nectar Cup --  Pharyngeal --   Pharyngeal- Nectar Straw --  Pharyngeal --  Pharyngeal- Thin Teaspoon Delayed swallow initiation-pyriform sinuses;Reduced epiglottic inversion;Reduced pharyngeal peristalsis;Reduced laryngeal elevation;Reduced airway/laryngeal closure;Reduced tongue base retraction;Penetration/Aspiration before swallow;Pharyngeal residue - valleculae;Pharyngeal residue - pyriform;Pharyngeal residue - posterior pharnyx;Significant aspiration (Amount)  Pharyngeal Material enters airway, passes BELOW cords without attempt by patient to eject out (silent aspiration);Material enters airway, passes BELOW cords and not ejected out despite cough attempt by patient  Pharyngeal- Thin Cup --  Pharyngeal --  Pharyngeal- Thin Straw --  Pharyngeal --  Pharyngeal- Puree Delayed swallow initiation-vallecula;Reduced pharyngeal peristalsis;Reduced epiglottic inversion;Reduced laryngeal elevation;Reduced airway/laryngeal closure;Penetration/Apiration after swallow;Reduced tongue base retraction;Pharyngeal residue - valleculae;Pharyngeal residue - pyriform;Pharyngeal residue - posterior pharnyx  Pharyngeal Material enters airway, remains ABOVE vocal cords and not ejected out  Pharyngeal- Mechanical Soft --  Pharyngeal --  Pharyngeal- Regular Delayed swallow initiation-vallecula;Reduced pharyngeal peristalsis;Reduced epiglottic inversion;Reduced laryngeal elevation;Reduced airway/laryngeal closure;Reduced tongue base retraction;Pharyngeal residue - valleculae;Pharyngeal residue - pyriform;Pharyngeal residue - posterior pharnyx  Pharyngeal --  Pharyngeal- Multi-consistency --  Pharyngeal --  Pharyngeal- Pill --  Pharyngeal --  Pharyngeal Comment --     CHL IP CERVICAL ESOPHAGEAL PHASE 08/08/2017  Cervical Esophageal Phase Impaired  Pudding Teaspoon --  Pudding Cup --  Honey Teaspoon --  Honey Cup --  Nectar Teaspoon --  Nectar Cup --  Nectar Straw --  Thin Teaspoon --  Thin Cup --  Thin Straw --  Puree --  Mechanical  Soft --  Regular --  Multi-consistency --  Pill --  Cervical Esophageal Comment --    No flowsheet data found.  Aliene Altes 08/08/2017, 1:36 PM   Deneise Lever, [place], [place] Speech-Language Pathologist [phone]

## 2017-08-08 NOTE — Progress Notes (Signed)
[facility] Progress Note  Patient: [name] [name] KTG:[id number]   PCP: [name], [name] DOB: 03/10/1946   DOA: 08/01/2017   DOS: 08/08/2017   Date of Service: the patient was seen and examined on 08/08/2017  Subjective: Wants to eat.  No nausea no vomiting.  No chest pain no abdominal pain.  Feeling better breathing better.  Still on oxygen.  Brief hospital course: Pt. with PMH of type II DM, dementia, depression, anxiety, CVA, multiple admissions with poorly controlled diabetes (both hyper and hypoglycemia); admitted on 08/01/2017, presented with complaint of confusion and hypoglycemia, was found to have DKA, acute encephalopathy requiring intubation for airway protection.  Patient was admitted to ICU.  DKA resolved on 08/04/2017.  Extubated on 08/06/2017.  Also had sepsis and was given IV antibiotics. Currently further plan is continue to work on swallowing.  Assessment and Plan: 1.  Acute hypoxic respiratory failure. Acute metabolic encephalopathy. Required intubation, was with ICU, currently better but still with dysphagia. Closely monitor. Extubated on 08/05/2017.  2.  Sepsis with aspiration pneumonia. Bibasilar. Treated with IV vancomycin and Zosyn. Blood cultures negative so far. Currently on IV Zosyn. Complete 7-day treatment per CCM.  3.  DKA. Type 2 diabetes mellitus, uncontrolled, both hyper and hypoglycemia. Hemoglobin A1c 9.9 and 06/27/2017. Multiple ER visits for both hyper and hypoglycemia. Unsure the patient is competent enough to manage his diabetes treatment at home on his own. Currently hyperglycemic, was hypoglycemic on 08/07/2017. Unfortunately patient will remain n.p.o. due to his dysphagia and therefore we will continue every 4 CBG, sensitive sliding scale. Patient was actually on 9 units of Levemir twice a day which I will reduce to 6 units of Levemir daily.  4.  Dysphagia. Aspiration. Patient likely presented with sepsis with aspiration  pneumonia. Is post extubation remains at high risk for aspiration. Underwent modified barium swallow on 08/08/2017 which still shows high risk for aspiration and speech therapy is recommending FEES and cor trac for enteral feeding. Patient refused CorTRak placement for now.  Remains n.p.o.  5.  Acute kidney injury on chronic kidney disease stage II. Hyperkalemia. Metabolic acidosis.  Non-anion gap Hypernatremia. Hyperchloremia. Hyperkalemia currently resolved, patient shows hypokalemia. Renal function back to baseline for now. Metabolic acidosis still remains a concern although anion gap remains closed. Acidosis more likely due to hyperchloremia. Patient was on D5 half normal saline, will maintain only on D5 as needed.  6.  Deconditioning. PT OT assessment.  7.  Anemia. No active bleeding, likely associated with acute sepsis. Monitor for now.  Diet: NPO, ice chips in between per speech therapist after mouth care. DVT Prophylaxis: subcutaneous Heparin  Advance goals of care discussion: full code  Family Communication: nofamily was present at bedside, at the time of interview.  Disposition:  Discharge to be determined.   Consultants: PCCM primary [place]  Procedures: ETT, MBS  Antibiotics: Anti-infectives    Start     Dose/Rate Route Frequency Ordered Stop   08/05/17 1600  piperacillin-tazobactam (ZOSYN) IVPB 3.375 g     3.375 g 12.5 mL/hr over 240 Minutes Intravenous Every 8 hours 08/05/17 0818     08/03/17 1600  vancomycin (VANCOCIN) IVPB 1000 mg/200 mL premix  Status:  Discontinued     1,000 mg 200 mL/hr over 60 Minutes Intravenous Every 48 hours 08/01/17 1630 08/04/17 1005   08/02/17 0000  piperacillin-tazobactam (ZOSYN) IVPB 2.25 g  Status:  Discontinued     2.25 g 100 mL/hr over 30 Minutes Intravenous Every 8 hours 08/01/17 1630  08/05/17 0818   08/01/17 1530  piperacillin-tazobactam (ZOSYN) IVPB 3.375 g     3.375 g 100 mL/hr over 30 Minutes Intravenous  Once  08/01/17 1523 08/01/17 1829   08/01/17 1530  vancomycin (VANCOCIN) IVPB 1000 mg/200 mL premix     1,000 mg 200 mL/hr over 60 Minutes Intravenous  Once 08/01/17 1523 08/01/17 1929       Objective: Physical Exam: Vitals:   08/07/17 1600 08/07/17 1712 08/08/17 0421 08/08/17 1348  BP: (!) 144/68 (!) 147/67 (!) 161/74 125/80  Pulse: (!) 56 (!) 59 65 76  Resp: '20 20 20 20  ' Temp:  98.6 F (37 C) 98.8 F (37.1 C) 98.1 F (36.7 C)  TempSrc:  Oral Oral Oral  SpO2: 95% 94% 92% 96%  Weight:      Height:        Intake/Output Summary (Last 24 hours) at 08/08/17 1509 Last data filed at 08/08/17 0645  Gross per 24 hour  Intake              512 ml  Output             2050 ml  Net            -1538 ml   Filed Weights   08/04/17 0156 08/05/17 0446 08/06/17 0500  Weight: 69.7 kg (153 lb 10.6 oz) 69 kg (152 lb 1.9 oz) 65.9 kg (145 lb 4.5 oz)   General: Alert, Awake and Oriented to Time, Place and Person. Appear in mild distress, affect appropriate Eyes: PERRL, Conjunctiva normal ENT: Oral Mucosa clear moist. Neck: no JVD, no Abnormal Mass Or lumps Cardiovascular: S1 and S2 Present, no Murmur, Peripheral Pulses Present Respiratory: normal respiratory effort, Bilateral Air entry equal and Decreased, no use of accessory muscle, bilateral Crackles, no wheezes Abdomen: Bowel Sound present, Soft and no tenderness, no hernia Skin: no redness, no Rash, no induration Extremities: no Pedal edema, no calf tenderness Neurologic: Grossly no focal neuro deficit. Bilaterally Equal motor strength  Data Reviewed: CBC:  Recent Labs Lab 08/03/17 0437 08/05/17 0435 08/06/17 0356 08/07/17 0500 08/08/17 1016  WBC 7.9 5.9 7.6 6.8 6.9  NEUTROABS 5.1  --   --   --   --   HGB 7.2* 8.0* 7.5* 7.6* 7.9*  HCT 21.1* 24.7* 23.5* 24.0* 24.8*  MCV 87.9 91.5 92.2 92.7 90.8  PLT 136* 122* 121* 143* 735   Basic Metabolic Panel:  Recent Labs Lab 08/02/17 1639  08/03/17 0437 08/03/17 1636 08/04/17 0500  08/05/17 0435 08/06/17 0356 08/07/17 0500 08/08/17 1016  NA  --   < > 139  --  142 144 144 149* 147*  K  --   < > 3.8  --  4.2 4.2 3.9 3.5 3.5  CL  --   < > 108  --  108 110 110 110 115*  CO2  --   < > 26  --  '24 25 24 28 ' 18*  GLUCOSE  --   < > 132*  --  83 253* 360* 98 309*  BUN  --   < > 42*  --  42* 41* 35* 28* 22*  CREATININE  --   < > 4.40*  --  3.86* 3.10* 2.64* 2.36* 2.20*  CALCIUM  --   < > 7.0*  --  7.4* 8.0* 7.9* 8.7* 8.4*  MG 2.5*  --  2.2 2.1  --  1.9  --  1.8  --   PHOS 1.9*  --  4.1  3.8  --  3.6  --  3.8  --   < > = values in this interval not displayed.  Liver Function Tests:  Recent Labs Lab 08/01/17 1608  AST 21  ALT 26  ALKPHOS 105  BILITOT 1.5*  PROT 6.3*  ALBUMIN 3.3*   No results for input(s): LIPASE, AMYLASE in the last 168 hours. No results for input(s): AMMONIA in the last 168 hours. Coagulation Profile: No results for input(s): INR, PROTIME in the last 168 hours. Cardiac Enzymes:  Recent Labs Lab 08/01/17 2100  TROPONINI <0.03   BNP (last 3 results) No results for input(s): PROBNP in the last 8760 hours. CBG:  Recent Labs Lab 08/07/17 2029 08/08/17 0112 08/08/17 0409 08/08/17 0741 08/08/17 1156  GLUCAP 74 132* 128* 213* 258*   Studies: No results found.  Scheduled Meds: . [START ON 08/09/2017] aspirin  300 mg Rectal Daily  . chlorhexidine gluconate (MEDLINE KIT)  15 mL Mouth Rinse BID  . Chlorhexidine Gluconate Cloth  6 each Topical Q0600  . heparin  5,000 Units Subcutaneous Q8H  . insulin aspart  0-9 Units Subcutaneous Q4H  . mouth rinse  15 mL Mouth Rinse QID  . nitroGLYCERIN  0.4 mg Transdermal Daily   Continuous Infusions: . sodium chloride    . piperacillin-tazobactam (ZOSYN)  IV Stopped (08/08/17 1200)   PRN Meds: fentaNYL (SUBLIMAZE) injection, hydrALAZINE  Time spent: 35 minutes  Author: Berle Mull, MD [facility] Pager: [phone] 08/08/2017 3:09 PM  If 7PM-7AM, please contact night-coverage at  [email or web address], password [facility]

## 2017-08-09 ENCOUNTER — Inpatient Hospital Stay (HOSPITAL_COMMUNITY): Payer: Medicare Other

## 2017-08-09 LAB — CBC
HCT: 24.6 % — ABNORMAL LOW (ref 39.0–52.0)
Hemoglobin: 8.1 g/dL — ABNORMAL LOW (ref 13.0–17.0)
MCH: 29.7 pg (ref 26.0–34.0)
MCHC: 32.9 g/dL (ref 30.0–36.0)
MCV: 90.1 fL (ref 78.0–100.0)
Platelets: 193 10*3/uL (ref 150–400)
RBC: 2.73 MIL/uL — ABNORMAL LOW (ref 4.22–5.81)
RDW: 15.3 % (ref 11.5–15.5)
WBC: 6.2 10*3/uL (ref 4.0–10.5)

## 2017-08-09 LAB — GLUCOSE, CAPILLARY
Glucose-Capillary: 123 mg/dL — ABNORMAL HIGH (ref 65–99)
Glucose-Capillary: 165 mg/dL — ABNORMAL HIGH (ref 65–99)
Glucose-Capillary: 186 mg/dL — ABNORMAL HIGH (ref 65–99)
Glucose-Capillary: 188 mg/dL — ABNORMAL HIGH (ref 65–99)
Glucose-Capillary: 194 mg/dL — ABNORMAL HIGH (ref 65–99)
Glucose-Capillary: 221 mg/dL — ABNORMAL HIGH (ref 65–99)
Glucose-Capillary: 258 mg/dL — ABNORMAL HIGH (ref 65–99)

## 2017-08-09 LAB — BASIC METABOLIC PANEL
Anion gap: 15 (ref 5–15)
BUN: 21 mg/dL — ABNORMAL HIGH (ref 6–20)
CO2: 18 mmol/L — ABNORMAL LOW (ref 22–32)
Calcium: 8.3 mg/dL — ABNORMAL LOW (ref 8.9–10.3)
Chloride: 109 mmol/L (ref 101–111)
Creatinine, Ser: 2 mg/dL — ABNORMAL HIGH (ref 0.61–1.24)
GFR calc Af Amer: 37 mL/min — ABNORMAL LOW (ref >60)
GFR calc non Af Amer: 32 mL/min — ABNORMAL LOW (ref >60)
Glucose, Bld: 202 mg/dL — ABNORMAL HIGH (ref 65–99)
Potassium: 3.1 mmol/L — ABNORMAL LOW (ref 3.5–5.1)
Sodium: 142 mmol/L (ref 135–145)

## 2017-08-09 LAB — MAGNESIUM: Magnesium: 1.9 mg/dL (ref 1.7–2.4)

## 2017-08-09 MED ORDER — INSULIN DETEMIR 100 UNIT/ML ~~LOC~~ SOLN
6.0000 [IU] | Freq: Every day | SUBCUTANEOUS | Status: DC
  Administered 2017-08-09 – 2017-08-11 (×3): 6 [IU] via SUBCUTANEOUS
  Filled 2017-08-09 (×3): qty 0.06

## 2017-08-09 MED ORDER — RESOURCE THICKENUP CLEAR PO POWD
ORAL | Status: DC | PRN
  Administered 2017-08-10: 17:00:00 via ORAL
  Filled 2017-08-09: qty 125

## 2017-08-09 MED ORDER — ASPIRIN EC 81 MG PO TBEC
81.0000 mg | DELAYED_RELEASE_TABLET | Freq: Every day | ORAL | Status: DC
  Administered 2017-08-10 – 2017-08-13 (×4): 81 mg via ORAL
  Filled 2017-08-09 (×5): qty 1

## 2017-08-09 MED ORDER — POTASSIUM CHLORIDE 10 MEQ/100ML IV SOLN
10.0000 meq | INTRAVENOUS | Status: AC
  Administered 2017-08-09 (×4): 10 meq via INTRAVENOUS
  Filled 2017-08-09 (×4): qty 100

## 2017-08-09 NOTE — Progress Notes (Signed)
Transportation here to take patient to CT. P.J. Linus Mako, RN

## 2017-08-09 NOTE — Progress Notes (Addendum)
  Speech Language Pathology Treatment: Dysphagia  Patient Details Name: [name] [name] MRN: [id number] DOB: [birth year] Today's Date: 08/09/2017 Time: 1035-1050 SLP Time Calculation (min) (ACUTE ONLY): 15 min  Assessment / Plan / Recommendation Clinical Impression  Pt seen for follow-up for dysphagia. Per charting, pt refused cortrak, was found to be drinking water, coughing by RN, fell getting out of bed this morning and taken for stat head CT, which was stable. Pt verbally agitated upon SLP entry. After SLP spoke with pt re: his wishes to eat and drink and offered to trial purees, pt was more agreeable and able to participate in treatment session. SLP educated pt again re: results of his MBS yesterday which showed frank aspiration of all liquids before the swallow, with delayed, non-productive cough. Educated pt re: acute and reversible nature of post-extubation dysphagia. As pt did not overtly aspirate pureed solids during yesterday's exam, provided trials of pureed solids via teaspoon for diagnostic assessment and training in compensatory techniques. Pt consumed 3 oz pureed solids with SLP assistance for control of rate/bolus size and cues for double swallow, intermittent throat clear. Noted wet vocal quality x1 when pt began speaking prior to initiating 2nd dry swallow. Spoke with RN; I recommend initiating small snacks of pureed solids, pudding thick liquids from floor stock with full supervision and cues for 2 swallows per bite, intermittent throat clear. Necessary medications can be given crushed in puree. Pt may have a few ice chips during the day after thorough oral care. Aspiration risk remains high; do not recommend initiating full diet yet. Again, anticipate rapid improvements in swallow function with additional time post-extubation. Will follow and repeat instrumental (likely FEES) as pt ready.     HPI HPI: 71 yo male with brittle diabetes presented with altered mental status from DKA, lactic  acidosis, VDRF with compromised airway, and sepsis. PMHx of DM, dementia, anxiety, depression, CVA.  ETT 10/20-10/25.       SLP Plan  Continue with current plan of care       Recommendations  Diet recommendations: snacks from floor stock: pureed solids;Pudding-thick liquid Liquids provided via: Teaspoon Medication Administration: Crushed with puree Supervision: Staff to assist with self feeding;Full supervision/cueing for compensatory strategies Compensations: Slow rate;Small sips/bites;Multiple dry swallows after each bite/sip;Clear throat intermittently Postural Changes and/or Swallow Maneuvers: Seated upright 90 degrees                Oral Care Recommendations: Oral care before and after PO Follow up Recommendations: Other (comment) (tba) SLP Visit Diagnosis: Dysphagia, oropharyngeal phase (R13.12) Plan: Continue with current plan of care       [place], [place], [place] Speech-Language Pathologist [phone]  Aliene Altes 08/09/2017, 10:56 AM

## 2017-08-09 NOTE — Progress Notes (Signed)
[facility] Progress Note  Patient: [name] [name] WHQ:[id number]   PCP: [name], [name] DOB: 04-Mar-1946   DOA: 08/01/2017   DOS: 08/09/2017   Date of Service: the patient was seen and examined on 08/09/2017  Subjective: No acute complaints, no headache no dizziness no lightheadedness, no nausea no vomiting.  Yesterday initially refused the cortrac placement, now agreeing to it.  Brief hospital course: Pt. with PMH of type II DM, dementia, depression, anxiety, CVA, multiple admissions with poorly controlled diabetes (both hyper and hypoglycemia); admitted on 08/01/2017, presented with complaint of confusion and hypoglycemia, was found to have DKA, acute encephalopathy requiring intubation for airway protection.  Patient was admitted to ICU.  DKA resolved on 08/04/2017.  Extubated on 08/06/2017.  Also had sepsis and was given IV antibiotics. Currently further plan is continue to work on swallowing.  Assessment and Plan: 1.  Acute hypoxic respiratory failure. Acute metabolic encephalopathy. Required intubation, was with ICU, currently better but still with dysphagia. Closely monitor. Extubated on 08/05/2017.  2.  Sepsis with aspiration pneumonia. Bibasilar. Treated with IV vancomycin and Zosyn. Blood cultures negative so far. Currently on IV Zosyn. Complete 7-day treatment per CCM.  3.  DKA. Type 2 diabetes mellitus, uncontrolled, both hyper and hypoglycemia. Hemoglobin A1c 9.9 and 06/27/2017. Multiple ER visits for both hyper and hypoglycemia. Unsure the patient is competent enough to manage his diabetes treatment at home on his own. Currently hyperglycemic, was hypoglycemic on 08/07/2017. Unfortunately patient will remain n.p.o. due to his dysphagia and therefore we will continue every 4 CBG, sensitive sliding scale. Patient was actually on 9 units of Levemir twice a day which I will reduce to 6 units of Levemir daily.  4.  Dysphagia. Aspiration. Patient likely presented  with sepsis with aspiration pneumonia. Is post extubation remains at high risk for aspiration. Underwent modified barium swallow on 08/08/2017 which still shows high risk for aspiration and speech therapy is recommending FEES and cor trac for enteral feeding. Patient refused CorTRak placement on 08/08/2017, now agreeing to it. Speech therapy consult today recommends to advance diet with small snacks of pured solid, pudding thick liquids and assisted feeding.  We will monitor for now.  5.  Acute kidney injury on chronic kidney disease stage II. Hyperkalemia. Metabolic acidosis.  Non-anion gap Hypernatremia. Hyperchloremia. Hyperkalemia currently resolved, patient shows hypokalemia. Renal function back to baseline for now. Metabolic acidosis still remains a concern although anion gap remains closed. Acidosis more likely due to hyperchloremia. Patient was on D5 half normal saline, will maintain only on D5 as needed.  6.  Deconditioning. PT OT assessment.  7.  Anemia. No active bleeding, likely associated with acute sepsis. Monitor for now.  Diet: NPO, ice chips in between per speech therapist after mouth care. DVT Prophylaxis: subcutaneous Heparin  Advance goals of care discussion: full code  Family Communication: nofamily was present at bedside, at the time of interview.  Disposition:  Discharge to be determined.   Consultants: PCCM primary [place]  Procedures: ETT, MBS  Antibiotics: Anti-infectives    Start     Dose/Rate Route Frequency Ordered Stop   08/05/17 1600  piperacillin-tazobactam (ZOSYN) IVPB 3.375 g     3.375 g 12.5 mL/hr over 240 Minutes Intravenous Every 8 hours 08/05/17 0818     08/03/17 1600  vancomycin (VANCOCIN) IVPB 1000 mg/200 mL premix  Status:  Discontinued     1,000 mg 200 mL/hr over 60 Minutes Intravenous Every 48 hours 08/01/17 1630 08/04/17 1005   08/02/17 0000  piperacillin-tazobactam (ZOSYN) IVPB 2.25 g  Status:  Discontinued     2.25 g 100  mL/hr over 30 Minutes Intravenous Every 8 hours 08/01/17 1630 08/05/17 0818   08/01/17 1530  piperacillin-tazobactam (ZOSYN) IVPB 3.375 g     3.375 g 100 mL/hr over 30 Minutes Intravenous  Once 08/01/17 1523 08/01/17 1829   08/01/17 1530  vancomycin (VANCOCIN) IVPB 1000 mg/200 mL premix     1,000 mg 200 mL/hr over 60 Minutes Intravenous  Once 08/01/17 1523 08/01/17 1929       Objective: Physical Exam: Vitals:   08/09/17 0800 08/09/17 1100 08/09/17 1300 08/09/17 1559  BP: (!) 141/63 (!) 137/91 123/71 137/76  Pulse: (!) 59  62 (!) 58  Resp:      Temp:      TempSrc:      SpO2:      Weight:      Height:        Intake/Output Summary (Last 24 hours) at 08/09/17 1605 Last data filed at 08/09/17 1333  Gross per 24 hour  Intake              261 ml  Output              975 ml  Net             -714 ml   Filed Weights   08/04/17 0156 08/05/17 0446 08/06/17 0500  Weight: 69.7 kg (153 lb 10.6 oz) 69 kg (152 lb 1.9 oz) 65.9 kg (145 lb 4.5 oz)   General: Alert, Awake and Oriented to Time, Place and Person. Appear in mild distress, affect appropriate Eyes: PERRL, Conjunctiva normal ENT: Oral Mucosa clear moist. Neck: no JVD, no Abnormal Mass Or lumps Cardiovascular: S1 and S2 Present, no Murmur, Peripheral Pulses Present Respiratory: normal respiratory effort, Bilateral Air entry equal and Decreased, no use of accessory muscle, bilateral Crackles, no wheezes Abdomen: Bowel Sound present, Soft and no tenderness, no hernia Skin: no redness, no Rash, no induration Extremities: no Pedal edema, no calf tenderness Neurologic: Grossly no focal neuro deficit. Bilaterally Equal motor strength  Data Reviewed: CBC:  Recent Labs Lab 08/03/17 0437 08/05/17 0435 08/06/17 0356 08/07/17 0500 08/08/17 1016 08/09/17 0418  WBC 7.9 5.9 7.6 6.8 6.9 6.2  NEUTROABS 5.1  --   --   --   --   --   HGB 7.2* 8.0* 7.5* 7.6* 7.9* 8.1*  HCT 21.1* 24.7* 23.5* 24.0* 24.8* 24.6*  MCV 87.9 91.5 92.2 92.7  90.8 90.1  PLT 136* 122* 121* 143* 184 384   Basic Metabolic Panel:  Recent Labs Lab 08/02/17 1639  08/03/17 0437 08/03/17 1636  08/05/17 0435 08/06/17 0356 08/07/17 0500 08/08/17 1016 08/09/17 0418  NA  --   < > 139  --   < > 144 144 149* 147* 142  K  --   < > 3.8  --   < > 4.2 3.9 3.5 3.5 3.1*  CL  --   < > 108  --   < > 110 110 110 115* 109  CO2  --   < > 26  --   < > _0 18* 18*  GLUCOSE  --   < > 132*  --   < > 253* 360* 98 309* 202*  BUN  --   < > 42*  --   < > 41* 35* 28* 22* 21*  CREATININE  --   < > 4.40*  --   < >  3.10* 2.64* 2.36* 2.20* 2.00*  CALCIUM  --   < > 7.0*  --   < > 8.0* 7.9* 8.7* 8.4* 8.3*  MG 2.5*  --  2.2 2.1  --  1.9  --  1.8  --  1.9  PHOS 1.9*  --  4.1 3.8  --  3.6  --  3.8  --   --   < > = values in this interval not displayed.  Liver Function Tests: No results for input(s): AST, ALT, ALKPHOS, BILITOT, PROT, ALBUMIN in the last 168 hours. No results for input(s): LIPASE, AMYLASE in the last 168 hours. No results for input(s): AMMONIA in the last 168 hours. Coagulation Profile: No results for input(s): INR, PROTIME in the last 168 hours. Cardiac Enzymes: No results for input(s): CKTOTAL, CKMB, CKMBINDEX, TROPONINI in the last 168 hours. BNP (last 3 results) No results for input(s): PROBNP in the last 8760 hours. CBG:  Recent Labs Lab 08/09/17 0014 08/09/17 0344 08/09/17 0757 08/09/17 1204 08/09/17 1543  GLUCAP 258* 188* 165* 221* 194*   Studies: Ct Head Wo Contrast  Result Date: 08/09/2017 CLINICAL DATA:  Combative, did not want to wake up after Ambien. History of hypertension, diabetes and stroke. EXAM: CT HEAD WITHOUT CONTRAST TECHNIQUE: Contiguous axial images were obtained from the base of the skull through the vertex without intravenous contrast. COMPARISON:  CT HEAD December 13, 2016 FINDINGS: BRAIN: No intraparenchymal hemorrhage, mass effect nor midline shift. The ventricles and sulci are normal for age. Patchy supratentorial  white matter hypodensities less than expected for patient's age, though non-specific are most compatible with chronic small vessel ischemic disease. No acute large vascular territory infarcts. No abnormal extra-axial fluid collections. Basal cisterns are patent. VASCULAR: Moderate calcific atherosclerosis of the carotid siphons. SKULL: No skull fracture. No significant scalp soft tissue swelling. SINUSES/ORBITS: Small LEFT sphenoid sinus air-fluid level. Mastoid air cells are well aerated. The included ocular globes and orbital contents are non-suspicious. OTHER: None. IMPRESSION: Stable negative noncontrast CT HEAD. Electronically Signed   By: Elon Alas M.D.   On: 08/09/2017 01:58    Scheduled Meds: . aspirin EC  81 mg Oral Daily  . chlorhexidine gluconate (MEDLINE KIT)  15 mL Mouth Rinse BID  . Chlorhexidine Gluconate Cloth  6 each Topical Q0600  . heparin  5,000 Units Subcutaneous Q8H  . insulin aspart  0-9 Units Subcutaneous Q4H  . insulin detemir  6 Units Subcutaneous Daily  . mouth rinse  15 mL Mouth Rinse QID  . nitroGLYCERIN  0.4 mg Transdermal Daily   Continuous Infusions: . dextrose 10 mL/hr at 08/08/17 1738  . piperacillin-tazobactam (ZOSYN)  IV Stopped (08/09/17 1247)   PRN Meds: hydrALAZINE, RESOURCE THICKENUP CLEAR  Time spent: 35 minutes  Author: Berle Mull, MD [facility] Pager: [phone] 08/09/2017 4:05 PM  If 7PM-7AM, please contact night-coverage at [email or web address], password [facility]

## 2017-08-09 NOTE — Progress Notes (Signed)
Patient's bed alarm sounded and staff arrived at room, patient up on side of bed and fell before RN and NTs could reach patient.  Patient hit his head on sink.  Staff assisted patient back to bed, no obvious injury noted.  Stat head CT ordered per protocol, MD notified, neuro checks and VS started per protocol.  No neuro deficit noted at this time.  RN will continue to follow up and assess patient and make MD aware of any issues. P.J. Linus Mako, RN

## 2017-08-09 NOTE — Progress Notes (Signed)
Patient returned from CT

## 2017-08-09 NOTE — Progress Notes (Signed)
RN attempted to notify patient's daughter regarding fall, no answer.  RN left general, HIPPA compliant VM requesting daughter return call.  P.J. Linus Mako, RN

## 2017-08-10 ENCOUNTER — Inpatient Hospital Stay (HOSPITAL_COMMUNITY): Payer: Medicare Other

## 2017-08-10 LAB — GLUCOSE, CAPILLARY
Glucose-Capillary: 118 mg/dL — ABNORMAL HIGH (ref 65–99)
Glucose-Capillary: 130 mg/dL — ABNORMAL HIGH (ref 65–99)
Glucose-Capillary: 132 mg/dL — ABNORMAL HIGH (ref 65–99)
Glucose-Capillary: 232 mg/dL — ABNORMAL HIGH (ref 65–99)
Glucose-Capillary: 302 mg/dL — ABNORMAL HIGH (ref 65–99)
Glucose-Capillary: 325 mg/dL — ABNORMAL HIGH (ref 65–99)
Glucose-Capillary: 44 mg/dL — CL (ref 65–99)

## 2017-08-10 LAB — RETICULOCYTES
RBC.: 2.84 MIL/uL — ABNORMAL LOW (ref 4.22–5.81)
Retic Count, Absolute: 56.8 10*3/uL (ref 19.0–186.0)
Retic Ct Pct: 2 % (ref 0.4–3.1)

## 2017-08-10 LAB — BASIC METABOLIC PANEL
Anion gap: 9 (ref 5–15)
Anion gap: 8 (ref 5–15)
BUN: 14 mg/dL (ref 6–20)
BUN: 13 mg/dL (ref 6–20)
CO2: 21 mmol/L — ABNORMAL LOW (ref 22–32)
CO2: 21 mmol/L — ABNORMAL LOW (ref 22–32)
Calcium: 8 mg/dL — ABNORMAL LOW (ref 8.9–10.3)
Calcium: 8 mg/dL — ABNORMAL LOW (ref 8.9–10.3)
Chloride: 112 mmol/L — ABNORMAL HIGH (ref 101–111)
Chloride: 112 mmol/L — ABNORMAL HIGH (ref 101–111)
Creatinine, Ser: 1.74 mg/dL — ABNORMAL HIGH (ref 0.61–1.24)
Creatinine, Ser: 1.59 mg/dL — ABNORMAL HIGH (ref 0.61–1.24)
GFR calc Af Amer: 43 mL/min — ABNORMAL LOW (ref >60)
GFR calc Af Amer: 48 mL/min — ABNORMAL LOW (ref >60)
GFR calc non Af Amer: 37 mL/min — ABNORMAL LOW (ref >60)
GFR calc non Af Amer: 42 mL/min — ABNORMAL LOW (ref >60)
Glucose, Bld: 134 mg/dL — ABNORMAL HIGH (ref 65–99)
Glucose, Bld: 141 mg/dL — ABNORMAL HIGH (ref 65–99)
Potassium: 2.6 mmol/L — CL (ref 3.5–5.1)
Potassium: 3.1 mmol/L — ABNORMAL LOW (ref 3.5–5.1)
Sodium: 142 mmol/L (ref 135–145)
Sodium: 141 mmol/L (ref 135–145)

## 2017-08-10 LAB — MAGNESIUM: Magnesium: 1.7 mg/dL (ref 1.7–2.4)

## 2017-08-10 LAB — CBC
HCT: 21.6 % — ABNORMAL LOW (ref 39.0–52.0)
HCT: 25.5 % — ABNORMAL LOW (ref 39.0–52.0)
Hemoglobin: 7.3 g/dL — ABNORMAL LOW (ref 13.0–17.0)
Hemoglobin: 8.2 g/dL — ABNORMAL LOW (ref 13.0–17.0)
MCH: 29.9 pg (ref 26.0–34.0)
MCH: 28.9 pg (ref 26.0–34.0)
MCHC: 33.8 g/dL (ref 30.0–36.0)
MCHC: 32.2 g/dL (ref 30.0–36.0)
MCV: 88.5 fL (ref 78.0–100.0)
MCV: 89.8 fL (ref 78.0–100.0)
Platelets: 235 10*3/uL (ref 150–400)
Platelets: 285 10*3/uL (ref 150–400)
RBC: 2.44 MIL/uL — ABNORMAL LOW (ref 4.22–5.81)
RBC: 2.84 MIL/uL — ABNORMAL LOW (ref 4.22–5.81)
RDW: 15.3 % (ref 11.5–15.5)
RDW: 15 % (ref 11.5–15.5)
WBC: 8 10*3/uL (ref 4.0–10.5)
WBC: 7 10*3/uL (ref 4.0–10.5)

## 2017-08-10 LAB — DIRECT ANTIGLOBULIN TEST (NOT AT ARMC)
DAT, IgG: NEGATIVE
DAT, complement: NEGATIVE

## 2017-08-10 LAB — LACTATE DEHYDROGENASE: LDH: 298 U/L — ABNORMAL HIGH (ref 98–192)

## 2017-08-10 LAB — TYPE AND SCREEN
ABO/RH(D): B POS
Antibody Screen: NEGATIVE

## 2017-08-10 LAB — FERRITIN: Ferritin: 546 ng/mL — ABNORMAL HIGH (ref 24–336)

## 2017-08-10 LAB — IRON AND TIBC
Iron: 59 ug/dL (ref 45–182)
Saturation Ratios: 24 % (ref 17.9–39.5)
TIBC: 251 ug/dL (ref 250–450)
UIBC: 192 ug/dL

## 2017-08-10 LAB — VITAMIN B12: Vitamin B-12: 2265 pg/mL — ABNORMAL HIGH (ref 180–914)

## 2017-08-10 MED ORDER — RESOURCE THICKENUP CLEAR PO POWD
Freq: Once | ORAL | Status: AC
  Administered 2017-08-10: 18:00:00 via ORAL
  Filled 2017-08-10: qty 125

## 2017-08-10 MED ORDER — MAGNESIUM SULFATE IN D5W 1-5 GM/100ML-% IV SOLN
1.0000 g | Freq: Once | INTRAVENOUS | Status: AC
  Administered 2017-08-10: 1 g via INTRAVENOUS
  Filled 2017-08-10: qty 100

## 2017-08-10 MED ORDER — POTASSIUM CHLORIDE 10 MEQ/100ML IV SOLN
10.0000 meq | INTRAVENOUS | Status: AC
  Administered 2017-08-10 (×5): 10 meq via INTRAVENOUS
  Filled 2017-08-10 (×6): qty 100

## 2017-08-10 MED ORDER — INSULIN ASPART 100 UNIT/ML ~~LOC~~ SOLN
0.0000 [IU] | Freq: Three times a day (TID) | SUBCUTANEOUS | Status: DC
  Administered 2017-08-11: 9 [IU] via SUBCUTANEOUS

## 2017-08-10 MED ORDER — POTASSIUM CHLORIDE 20 MEQ PO PACK
20.0000 meq | PACK | Freq: Two times a day (BID) | ORAL | Status: DC
  Administered 2017-08-10 – 2017-08-12 (×4): 20 meq via ORAL
  Filled 2017-08-10 (×4): qty 1

## 2017-08-10 NOTE — Progress Notes (Signed)
[facility] Progress Note  Patient: [name] [name] KGU:[id number]   PCP: [name], [name] DOB: 04/09/1946   DOA: 08/01/2017   DOS: 08/10/2017   Date of Service: the patient was seen and examined on 08/10/2017  Subjective: No acute complaints, no headache no dizziness no lightheadedness, no nausea no vomiting.   Continues to have difficulty swallowing.  No diarrhea  Brief hospital course: Pt. with PMH of type II DM, dementia, depression, anxiety, CVA, multiple admissions with poorly controlled diabetes (both hyper and hypoglycemia); admitted on 08/01/2017, presented with complaint of confusion and hypoglycemia, was found to have DKA, acute encephalopathy requiring intubation for airway protection.  Patient was admitted to ICU.  DKA resolved on 08/04/2017.  Extubated on 08/06/2017.  Also had sepsis and was given IV antibiotics. Currently further plan is continue to work on swallowing.  Assessment and Plan: 1.  Acute hypoxic respiratory failure. Acute metabolic encephalopathy. Required intubation, was with ICU, currently better but still with dysphagia. Closely monitor. Extubated on 08/05/2017.  2.  Sepsis with aspiration pneumonia. Bibasilar. Treated with IV vancomycin and Zosyn. Blood cultures negative so far. Completed 10-day treatment course with IV Zosyn.  3.  DKA. Type 2 diabetes mellitus, uncontrolled, both hyper and hypoglycemia. Hemoglobin A1c 9.9 and 06/27/2017. Multiple ER visits for both hyper and hypoglycemia. Unsure the patient is competent enough to manage his diabetes treatment at home on his own. Blood sugars are fluctuating for both hyper and hypo-glycemia. Patient will be advanced to dysphagia type I diet will monitor on current regimen.  4.  Post extubation dysphagia. Aspiration. Patient likely presented with sepsis with aspiration pneumonia. Remains at high risk for aspiration after extubation Underwent modified barium swallow on 08/08/2017 as well as  on 08/10/2017, which still shows high risk for aspiration and speech therapy is recommending dysphagia 1 diet with pudding thick consistency. Concern is that the patient will not be able to maintain hydration status on this thickness. Patient not a great candidate for FEES. Appreciate input from speech therapy.  5.  Acute kidney injury on chronic kidney disease stage II. Hyperkalemia. Metabolic acidosis.  Non-anion gap Hypernatremia. Hyperchloremia. Hyperkalemia currently resolved, patient shows hypokalemia. Renal function back to baseline for now. Metabolic acidosis still remains a concern although anion gap remains closed. Acidosis more likely due to hyperchloremia. Patient was on D5 half normal saline, will maintain only on D5 as needed.  6.  Deconditioning. PT OT assessment.  7.  Anemia. No active bleeding, likely associated with acute sepsis. Monitor for now. Check anemia panel, if hemoglobin less than 7 transfuse.  8.  Goals of care discussion. Discussed with patient's daughter on the phone regarding his goals of care. Patient wants to go home wants to eat. Daughter wants the patient to remain full code. Continue to monitor for now. Full code for now.  Diet: Dysphagia type I diet DVT Prophylaxis: SCD  Advance goals of care discussion: full code  Family Communication: no family was present at bedside, at the time of interview.  Discussed with daughter on the phone Disposition:  Discharge to SNF  Consultants: PCCM primary admkission  Procedures: ETT, MBS  Antibiotics: Anti-infectives    Start     Dose/Rate Route Frequency Ordered Stop   08/05/17 1600  piperacillin-tazobactam (ZOSYN) IVPB 3.375 g  Status:  Discontinued     3.375 g 12.5 mL/hr over 240 Minutes Intravenous Every 8 hours 08/05/17 0818 08/10/17 0827   08/03/17 1600  vancomycin (VANCOCIN) IVPB 1000 mg/200 mL premix  Status:  Discontinued     1,000 mg 200 mL/hr over 60 Minutes Intravenous Every 48 hours  08/01/17 1630 08/04/17 1005   08/02/17 0000  piperacillin-tazobactam (ZOSYN) IVPB 2.25 g  Status:  Discontinued     2.25 g 100 mL/hr over 30 Minutes Intravenous Every 8 hours 08/01/17 1630 08/05/17 0818   08/01/17 1530  piperacillin-tazobactam (ZOSYN) IVPB 3.375 g     3.375 g 100 mL/hr over 30 Minutes Intravenous  Once 08/01/17 1523 08/01/17 1829   08/01/17 1530  vancomycin (VANCOCIN) IVPB 1000 mg/200 mL premix     1,000 mg 200 mL/hr over 60 Minutes Intravenous  Once 08/01/17 1523 08/01/17 1929       Objective: Physical Exam: Vitals:   08/09/17 1300 08/09/17 1559 08/09/17 2345 08/10/17 0424  BP: 123/71 137/76 (!) 160/66 (!) 163/77  Pulse: 62 (!) 58 (!) 54 (!) 50  Resp:   18 18  Temp:   98.3 F (36.8 C) 98.6 F (37 C)  TempSrc:   Oral Oral  SpO2:   100% 99%  Weight:      Height:        Intake/Output Summary (Last 24 hours) at 08/10/17 1514 Last data filed at 08/10/17 0647  Gross per 24 hour  Intake           272.67 ml  Output                0 ml  Net           272.67 ml   Filed Weights   08/04/17 0156 08/05/17 0446 08/06/17 0500  Weight: 69.7 kg (153 lb 10.6 oz) 69 kg (152 lb 1.9 oz) 65.9 kg (145 lb 4.5 oz)   General: Alert, Awake and Oriented to Time, Place and Person. Appear in mild distress, affect appropriate Eyes: PERRL, Conjunctiva normal ENT: Oral Mucosa clear moist. Neck: no JVD, no Abnormal Mass Or lumps Cardiovascular: S1 and S2 Present, no Murmur, Peripheral Pulses Present Respiratory: normal respiratory effort, Bilateral Air entry equal and Decreased, no use of accessory muscle, bilateral Crackles, no wheezes Abdomen: Bowel Sound present, Soft and no tenderness, no hernia Skin: no redness, no Rash, no induration Extremities: no Pedal edema, no calf tenderness Neurologic: Grossly no focal neuro deficit. Bilaterally Equal motor strength  Data Reviewed: CBC:  Recent Labs Lab 08/06/17 0356 08/07/17 0500 08/08/17 1016 08/09/17 0418 08/10/17 0544  WBC  7.6 6.8 6.9 6.2 8.0  HGB 7.5* 7.6* 7.9* 8.1* 7.3*  HCT 23.5* 24.0* 24.8* 24.6* 21.6*  MCV 92.2 92.7 90.8 90.1 88.5  PLT 121* 143* 184 193 631   Basic Metabolic Panel:  Recent Labs Lab 08/03/17 1636  08/05/17 0435 08/06/17 0356 08/07/17 0500 08/08/17 1016 08/09/17 0418 08/10/17 0544  NA  --   < > 144 144 149* 147* 142 142  K  --   < > 4.2 3.9 3.5 3.5 3.1* 2.6*  CL  --   < > 110 110 110 115* 109 112*  CO2  --   < > '25 24 28 ' 18* 18* 21*  GLUCOSE  --   < > 253* 360* 98 309* 202* 134*  BUN  --   < > 41* 35* 28* 22* 21* 14  CREATININE  --   < > 3.10* 2.64* 2.36* 2.20* 2.00* 1.74*  CALCIUM  --   < > 8.0* 7.9* 8.7* 8.4* 8.3* 8.0*  MG 2.1  --  1.9  --  1.8  --  1.9  --   PHOS  3.8  --  3.6  --  3.8  --   --   --   < > = values in this interval not displayed.  Liver Function Tests: No results for input(s): AST, ALT, ALKPHOS, BILITOT, PROT, ALBUMIN in the last 168 hours. No results for input(s): LIPASE, AMYLASE in the last 168 hours. No results for input(s): AMMONIA in the last 168 hours. Coagulation Profile: No results for input(s): INR, PROTIME in the last 168 hours. Cardiac Enzymes: No results for input(s): CKTOTAL, CKMB, CKMBINDEX, TROPONINI in the last 168 hours. BNP (last 3 results) No results for input(s): PROBNP in the last 8760 hours. CBG:  Recent Labs Lab 08/09/17 2009 08/10/17 0019 08/10/17 0422 08/10/17 0830 08/10/17 1103  GLUCAP 186* 132* 130* 302* 232*   Studies: Dg Swallowing Func-speech Pathology  Result Date: 08/10/2017 Objective Swallowing Evaluation: Type of Study: MBS-Modified Barium Swallow Study Patient Details Name: Ilija Maxim MRN: [id number] Date of Birth: 08-05-1946 Today's Date: 08/10/2017 Time: SLP Start Time (ACUTE ONLY): 1300-SLP Stop Time (ACUTE ONLY): 1345 SLP Time Calculation (min) (ACUTE ONLY): 45 min Past Medical History: Past Medical History: Diagnosis Date . 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 Closed comminuted intertrochanteric fracture of left femur ([place])  . Diabetes mellitus without complication ([place])  . Diabetic hyperosmolar non-ketotic state ([place]) 05/15/2016 . DKA (diabetic ketoacidoses) ([place]) 05/09/2016 . Hypertension  . Postoperative anemia due to acute blood loss 06/18/2016 . Retroperitoneal hematoma 06/18/2016 . Stroke ([place])  . Vitamin B 12 deficiency 06/18/2016 Past Surgical History: Past Surgical History: Procedure Laterality Date . HIP ARTHROPLASTY Right 02/05/2013  Procedure: ARTHROPLASTY BIPOLAR HIP;  Surgeon: [name], MD;  Location: [facility];  Service: Orthopedics;  Laterality: Right; . INTRAMEDULLARY (IM) NAIL INTERTROCHANTERIC Left 06/16/2016  Procedure: INTRAMEDULLARY (IM) NAIL INTERTROCHANTRIC;  Surgeon: Rod Can, MD;  Location: [facility];  Service: Orthopedics;  Laterality: Left; HPI: 71 yo male with brittle diabetes presented with altered mental status from DKA, lactic acidosis, VDRF with compromised airway, and sepsis. PMHx of DM, dementia, anxiety, depression, CVA.  ETT 10/20-10/25.  Subjective: pleasant, cooperative Assessment / Plan / Recommendation CHL IP CLINICAL IMPRESSIONS 08/10/2017 Clinical Impression Pt presents with persisting dysphagia notable for trace aspiration of thin, nectar, and honey-thick consistencies - acute source is likely to be laryngeal trauma s/p five-day intubation, but pt also presents with reduced propulsion of materials through pharynx and presence of osteophytes that may impede full airway closure.  There is likely a mild, chronic component to his dysphagia.  Postural adjustments (head turns, tilts left and right and chin tuck) made only a marginal difference and did not reliably protect airway. Discussed results with pt, who viewed video in real time.  Barium that lined anterior trachea was identified for him so that he could visualize the aspiration, but he continued to deny that he is having any difficulty swallowing.  Results were also  discussed with Dr. Posey Pronto and pt's daughter, Karna Christmas, via phone.  Safest POs that are least likely to be aspirated are dysphagia 1 solids and pudding-thick liquids, the latter unlikely to be accepted by the pt.  For today, recommend initiating this diet until pt/family accept the risks associated with eating/drinking according to his preferences.  SLP Visit Diagnosis Dysphagia, oropharyngeal phase (R13.12) Attention and concentration deficit following -- Frontal lobe and executive function deficit following -- Impact on safety and function Moderate aspiration risk;Risk for inadequate nutrition/hydration   CHL IP TREATMENT RECOMMENDATION 08/10/2017 Treatment Recommendations Therapy  as outlined in treatment plan below   Prognosis 08/10/2017 Prognosis for Safe Diet Advancement Fair Barriers to Reach Goals -- Barriers/Prognosis Comment -- CHL IP DIET RECOMMENDATION 08/10/2017 SLP Diet Recommendations Dysphagia 1 (Puree) solids;Pudding thick liquid Liquid Administration via Spoon Medication Administration Crushed with puree Compensations Slow rate;Small sips/bites;Effortful swallow Postural Changes Remain semi-upright after after feeds/meals (Comment)   CHL IP OTHER RECOMMENDATIONS 08/10/2017 Recommended Consults -- Oral Care Recommendations Oral care BID Other Recommendations Order thickener from pharmacy   CHL IP FOLLOW UP RECOMMENDATIONS 08/10/2017 Follow up Recommendations Skilled Nursing facility   [facility] IP FREQUENCY AND DURATION 08/10/2017 Speech Therapy Frequency (ACUTE ONLY) min 2x/week Treatment Duration 2 weeks      CHL IP ORAL PHASE 08/10/2017 Oral Phase Impaired Oral - Pudding Teaspoon -- Oral - Pudding Cup -- Oral - Honey Teaspoon -- Oral - Honey Cup -- Oral - Nectar Teaspoon -- Oral - Nectar Cup -- Oral - Nectar Straw -- Oral - Thin Teaspoon -- Oral - Thin Cup -- Oral - Thin Straw -- Oral - Puree Weak lingual manipulation;Delayed oral transit Oral - Mech Soft -- Oral - Regular -- Oral - Multi-Consistency -- Oral  - Pill -- Oral Phase - Comment --  CHL IP PHARYNGEAL PHASE 08/10/2017 Pharyngeal Phase Impaired Pharyngeal- Pudding Teaspoon -- Pharyngeal -- Pharyngeal- Pudding Cup -- Pharyngeal -- Pharyngeal- Honey Teaspoon Delayed swallow initiation-pyriform sinuses;Reduced epiglottic inversion;Reduced anterior laryngeal mobility;Reduced laryngeal elevation;Reduced airway/laryngeal closure;Reduced tongue base retraction;Penetration/Aspiration before swallow;Trace aspiration;Pharyngeal residue - valleculae Pharyngeal Material enters airway, passes BELOW cords without attempt by patient to eject out (silent aspiration) Pharyngeal- Honey Cup -- Pharyngeal -- Pharyngeal- Nectar Teaspoon -- Pharyngeal -- Pharyngeal- Nectar Cup -- Pharyngeal -- Pharyngeal- Nectar Straw -- Pharyngeal -- Pharyngeal- Thin Teaspoon Delayed swallow initiation-pyriform sinuses;Reduced epiglottic inversion;Reduced anterior laryngeal mobility;Reduced laryngeal elevation;Reduced airway/laryngeal closure;Reduced tongue base retraction;Penetration/Aspiration before swallow;Trace aspiration Pharyngeal Material enters airway, passes BELOW cords without attempt by patient to eject out (silent aspiration);Material enters airway, passes BELOW cords and not ejected out despite cough attempt by patient Pharyngeal- Thin Cup -- Pharyngeal -- Pharyngeal- Thin Straw -- Pharyngeal -- Pharyngeal- Puree Delayed swallow initiation-pyriform sinuses;Reduced epiglottic inversion;Reduced anterior laryngeal mobility;Reduced laryngeal elevation;Reduced airway/laryngeal closure;Reduced tongue base retraction;Pharyngeal residue - valleculae Pharyngeal Material does not enter airway Pharyngeal- Mechanical Soft -- Pharyngeal -- Pharyngeal- Regular -- Pharyngeal -- Pharyngeal- Multi-consistency -- Pharyngeal -- Pharyngeal- Pill -- Pharyngeal -- Pharyngeal Comment --  CHL IP CERVICAL ESOPHAGEAL PHASE 08/08/2017 Cervical Esophageal Phase Impaired Pudding Teaspoon -- Pudding Cup -- Honey  Teaspoon -- Honey Cup -- Nectar Teaspoon -- Nectar Cup -- Nectar Straw -- Thin Teaspoon -- Thin Cup -- Thin Straw -- Puree -- Mechanical Soft -- Regular -- Multi-consistency -- Pill -- Cervical Esophageal Comment -- No flowsheet data found. Juan Quam Laurice 08/10/2017, 2:04 PM               Scheduled Meds: . aspirin EC  81 mg Oral Daily  . chlorhexidine gluconate (MEDLINE KIT)  15 mL Mouth Rinse BID  . Chlorhexidine Gluconate Cloth  6 each Topical Q0600  . heparin  5,000 Units Subcutaneous Q8H  . insulin aspart  0-9 Units Subcutaneous Q4H  . insulin detemir  6 Units Subcutaneous Daily  . mouth rinse  15 mL Mouth Rinse QID  . nitroGLYCERIN  0.4 mg Transdermal Daily  . RESOURCE THICKENUP CLEAR   Oral Once   Continuous Infusions: . dextrose 10 mL/hr at 08/09/17 1700   PRN Meds: hydrALAZINE, RESOURCE THICKENUP CLEAR  Time spent: 35 minutes  Author: Berle Mull, MD [facility] Pager: [phone] 08/10/2017 3:14 PM  If 7PM-7AM, please contact night-coverage at [email or web address], password [facility]

## 2017-08-10 NOTE — Care Management Important Message (Signed)
Important Message  Patient Details  Name: [name] [name] MRN: [id number] Date of Birth: November 10, 1945   Medicare Important Message Given:  Yes    Yassin Scales Abena 08/10/2017, 9:22 AM

## 2017-08-10 NOTE — Progress Notes (Signed)
PT Cancellation Note  Patient Details Name: [name] [name] MRN: [id number] DOB: [birth year]   Cancelled Treatment:    Reason Eval/Treat Not Completed: Patient declined, no reason specified.  Pt declined to get up OOB even with daughter's encouragement.  Will try back tomorrow as able. 08/10/2017  Donnella Sham, [place] [phone]  (pager)   [name] 08/10/2017, 4:18 PM

## 2017-08-10 NOTE — Progress Notes (Signed)
PT Cancellation Note  Patient Details Name: [name] [name] MRN: [id number] DOB: [birth year]   Cancelled Treatment:    Reason Eval/Treat Not Completed: Patient at procedure or test/unavailable.  Pt heading imminently to swallow study.  Will see later as able.  08/10/2017  Donnella Sham, [place] [phone]  (pager)   Tessie Fass Marguarite Markov 08/10/2017, 12:46 PM

## 2017-08-10 NOTE — Progress Notes (Signed)
OT Cancellation    08/10/17 1500  OT Visit Information  Last OT Received On 08/10/17  Reason Eval/Treat Not Completed Patient declined, no reason specified (Pt declining any level of therapy stating "I won't get out of this bed. I don't care what you have to say. I just want to be left alone. Leave me alone." Will return as schedule allows.)   Kynlea Blackston MSOT, OTR/L Acute Rehab Pager: [phone] Office: [phone]

## 2017-08-10 NOTE — Progress Notes (Signed)
  Speech Language Pathology Treatment: Dysphagia  Patient Details Name: [name] [name] MRN: [id number] DOB: 01/21/1946 Today's Date: 08/10/2017 Time: 1010-1020 SLP Time Calculation (min) (ACUTE ONLY): 10 min  Assessment / Plan / Recommendation Clinical Impression  Pt with improved toleration of pureed solids at bedside, continues with persisting s/s of aspiration after consumption of thin liquids.  Voice quality is improved with better volume. Question some baseline level of dysphagia PTA.  Recommend repeating MBS today to determine safest PO diet pending D/C.  Pt has refused cortrak placement, and he would likely not tolerate FEES.  D/W RN.   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 PMHx of DM, dementia, anxiety, depression, CVA.  ETT 10/20-10/25.       SLP Plan  MBS       Recommendations  Diet recommendations: NPO Medication Administration: Crushed with puree                Plan: MBS       GO                Juan Quam Laurice 08/10/2017, 11:28 AM

## 2017-08-10 NOTE — NC FL2 (Signed)
[place] LEVEL OF CARE SCREENING TOOL     IDENTIFICATION  Patient Name: [name] [name] Birthdate: 05/06/1946 Sex: male Admission Date (Current Location): 08/01/2017  [facility] and [place] Number:  Herbalist and Address:  [facility], [place] [address] [place], [place] [address]      Provider Number: [id number]  Attending Physician Name and Address:  Lavina Hamman, MD  Relative Name and Phone Number:       Current Level of Care: Hospital Recommended Level of Care: [place] Prior Approval Number:    Date Approved/Denied:   PASRR Number: [id number]  Discharge Plan: SNF    Current Diagnoses: Patient Active Problem List   Diagnosis Date Noted  . Respiratory failure ([place])   . DKA, type 2 ([place]) 08/01/2017  . History of cerebrovascular accident (CVA) in adulthood 07/25/2017  . HTN (hypertension) 07/25/2017  . Depression with anxiety 07/25/2017  . Left ventricular diastolic dysfunction 16/55/3748  . Dementia 07/25/2017  . Acute kidney injury ([place]) 07/25/2017  . 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 Hypoglycemia due to insulin 06/27/2017  . CKD (chronic kidney disease), stage III ([place]) 06/27/2017  . Diabetes mellitus, insulin dependent (IDDM), controlled ([place]) 06/27/2017  . History of CVA in adulthood 06/27/2017  . Hypertension 06/27/2017  . Hypothermia 06/27/2017  . Lactic acid increased 06/27/2017  . DKA (diabetic ketoacidoses) ([place]) 05/03/2017  . DKA (diabetic ketoacidosis) ([place]) 03/22/2017  . Elbow pain, right 03/22/2017  . Abdominal pain due to injury 03/22/2017  . Rib pain on left side 01/29/2017  . Dehydration   . Urinary tract infection without hematuria   . Hyperkalemia 01/16/2017  . Acute metabolic encephalopathy 27/04/8674  . Elevated lactic acid level   . AKI (acute kidney injury) ([place]) 12/14/2016  . Hypoglycemia   . Sepsis ([place]) 12/13/2016  . Uncontrolled type 2 diabetes mellitus with  complication ([place])   . Acute renal failure superimposed on stage 3 chronic kidney disease ([place])   . Noncompliance with medication regimen 08/17/2016  . History of DVT (deep vein thrombosis) 07/06/2016  . Retroperitoneal hematoma 06/18/2016  . Vitamin B 12 deficiency 06/18/2016  . Hyperglycemia 05/15/2016  . Anxiety 05/15/2016  . History of CVA (cerebrovascular accident) 05/15/2016  . Hypoglycemia, coma ([place]) 11/02/2015  . Narcotic dependency, continuous ([place]) 11/02/2015  . Uncontrolled type 2 diabetes mellitus with diabetic nephropathy, with long-term current use of insulin ([place]) 08/12/2015  . Depression 08/12/2015  . CKD (chronic kidney disease) stage 3, GFR 30-59 ml/min ([name]) 03/20/2014    Orientation RESPIRATION BLADDER Height & Weight     Self, Place  Normal Incontinent, External catheter Weight: 145 lb 4.5 oz (65.9 kg) Height:  _0  (167.6 cm)  BEHAVIORAL SYMPTOMS/MOOD NEUROLOGICAL BOWEL NUTRITION STATUS      Continent Diet (see DC summary)  AMBULATORY STATUS COMMUNICATION OF NEEDS Skin   Limited Assist Verbally Normal                       Personal Care Assistance Level of Assistance  Bathing, Dressing Bathing Assistance: Limited assistance   Dressing Assistance: Limited assistance     Functional Limitations Info             SPECIAL CARE FACTORS FREQUENCY  PT (By licensed PT), OT (By licensed OT)     PT Frequency: 5/wk OT Frequency: 5/wk            Contractures      Additional  Factors Info  Code Status, Allergies, Insulin Sliding Scale Code Status Info: FULL Allergies Info: NKA   Insulin Sliding Scale Info: 7/day       Current Medications (08/10/2017):  This is the current hospital active medication list Current Facility-Administered Medications  Medication Dose Route Frequency Provider Last Rate Last Dose  . aspirin EC tablet 81 mg  81 mg Oral Daily Lavina Hamman, MD      . chlorhexidine gluconate (MEDLINE KIT) (PERIDEX) 0.12 % solution  15 mL  15 mL Mouth Rinse BID Magdalen Spatz, NP   15 mL at 08/10/17 0908  . Chlorhexidine Gluconate Cloth 2 % PADS 6 each  6 each Topical Q0600 Hammonds, Sharyn Blitz, MD   6 each at 08/10/17 0600  . dextrose 5 % solution   Intravenous Continuous Lavina Hamman, MD 10 mL/hr at 08/09/17 1700    . heparin injection 5,000 Units  5,000 Units Subcutaneous Q8H Magdalen Spatz, NP   5,000 Units at 08/10/17 0551  . hydrALAZINE (APRESOLINE) injection 10 mg  10 mg Intravenous Q4H PRN Lavina Hamman, MD      . insulin aspart (novoLOG) injection 0-9 Units  0-9 Units Subcutaneous Q4H Chesley Mires, MD   7 Units at 08/10/17 0908  . insulin detemir (LEVEMIR) injection 6 Units  6 Units Subcutaneous Daily Lavina Hamman, MD   6 Units at 08/10/17 [phone]  . MEDLINE mouth rinse  15 mL Mouth Rinse QID Magdalen Spatz, NP   15 mL at 08/10/17 0431  . nitroGLYCERIN (NITRODUR - Dosed in mg/24 hr) patch 0.4 mg  0.4 mg Transdermal Daily Lavina Hamman, MD   0.4 mg at 08/10/17 0911  . potassium chloride 10 mEq in 100 mL IVPB  10 mEq Intravenous Q1 Hr x 5 Lavina Hamman, MD 100 mL/hr at 08/10/17 0910 10 mEq at 08/10/17 0910  . [place]   Oral PRN Chesley Mires, MD         Discharge Medications: Please see discharge summary for a list of discharge medications.  Relevant Imaging Results:  Relevant Lab Results:   Additional Information SSN: [id number]  Jorge Ny, LCSW

## 2017-08-10 NOTE — Progress Notes (Signed)
Modified Barium Swallow Progress Note  Patient Details  Name: [name] [name] MRN: [id number] Date of Birth: 08/27/1946  Today's Date: 08/10/2017  Modified Barium Swallow completed.  Full report located under Chart Review in the Imaging Section.  Brief recommendations include the following:  Clinical Impression  Pt presents with persisting dysphagia notable for trace aspiration of thin, nectar, and honey-thick consistencies - acute source is likely to be laryngeal trauma s/p five-day intubation, but pt also presents with reduced propulsion of materials through pharynx and presence of osteophytes that may impede full airway closure.  There is likely a mild, chronic component to his dysphagia.  Postural adjustments (head turns, tilts left and right and chin tuck) made only a marginal difference and did not reliably protect airway. Discussed results with pt, who viewed video in real time.  Barium that lined anterior trachea was identified for him so that he could visualize the aspiration, but he continued to deny that he is having any difficulty swallowing.  Results were also discussed with Dr. Posey Pronto and pt's daughter, Karna Christmas, via phone.  Safest POs that are least likely to be aspirated are dysphagia 1 solids and pudding-thick liquids, the latter unlikely to be accepted by the pt.  For today, recommend initiating this diet until pt/family accept the risks associated with eating/drinking according to his preferences.    Swallow Evaluation Recommendations       SLP Diet Recommendations: Dysphagia 1 (Puree) solids;Pudding thick liquid   Liquid Administration via: Spoon   Medication Administration: Crushed with puree   Supervision: Patient able to self feed;Full supervision/cueing for compensatory strategies   Compensations: Slow rate;Small sips/bites;Effortful swallow   Postural Changes: Remain semi-upright after after feeds/meals (Comment)   Oral Care Recommendations: Oral care BID   Other  Recommendations: Order thickener from pharmacy    Juan Quam Laurice 08/10/2017,2:05 PM

## 2017-08-11 ENCOUNTER — Encounter (HOSPITAL_COMMUNITY): Payer: Self-pay

## 2017-08-11 LAB — BASIC METABOLIC PANEL
Anion gap: 13 (ref 5–15)
Anion gap: 11 (ref 5–15)
Anion gap: 8 (ref 5–15)
BUN: 25 mg/dL — ABNORMAL HIGH (ref 6–20)
BUN: 24 mg/dL — ABNORMAL HIGH (ref 6–20)
BUN: 19 mg/dL (ref 6–20)
CO2: 16 mmol/L — ABNORMAL LOW (ref 22–32)
CO2: 15 mmol/L — ABNORMAL LOW (ref 22–32)
CO2: 18 mmol/L — ABNORMAL LOW (ref 22–32)
Calcium: 8.3 mg/dL — ABNORMAL LOW (ref 8.9–10.3)
Calcium: 8.3 mg/dL — ABNORMAL LOW (ref 8.9–10.3)
Calcium: 8.5 mg/dL — ABNORMAL LOW (ref 8.9–10.3)
Chloride: 106 mmol/L (ref 101–111)
Chloride: 110 mmol/L (ref 101–111)
Chloride: 115 mmol/L — ABNORMAL HIGH (ref 101–111)
Creatinine, Ser: 1.89 mg/dL — ABNORMAL HIGH (ref 0.61–1.24)
Creatinine, Ser: 1.84 mg/dL — ABNORMAL HIGH (ref 0.61–1.24)
Creatinine, Ser: 1.4 mg/dL — ABNORMAL HIGH (ref 0.61–1.24)
GFR calc Af Amer: 39 mL/min — ABNORMAL LOW (ref >60)
GFR calc Af Amer: 41 mL/min — ABNORMAL LOW (ref >60)
GFR calc Af Amer: 56 mL/min — ABNORMAL LOW (ref >60)
GFR calc non Af Amer: 34 mL/min — ABNORMAL LOW (ref >60)
GFR calc non Af Amer: 35 mL/min — ABNORMAL LOW (ref >60)
GFR calc non Af Amer: 49 mL/min — ABNORMAL LOW (ref >60)
Glucose, Bld: 783 mg/dL (ref 65–99)
Glucose, Bld: 668 mg/dL (ref 65–99)
Glucose, Bld: 103 mg/dL — ABNORMAL HIGH (ref 65–99)
Potassium: 4.8 mmol/L (ref 3.5–5.1)
Potassium: 4 mmol/L (ref 3.5–5.1)
Potassium: 3.3 mmol/L — ABNORMAL LOW (ref 3.5–5.1)
Sodium: 135 mmol/L (ref 135–145)
Sodium: 136 mmol/L (ref 135–145)
Sodium: 141 mmol/L (ref 135–145)

## 2017-08-11 LAB — CBC
HCT: 25.4 % — ABNORMAL LOW (ref 39.0–52.0)
Hemoglobin: 7.8 g/dL — ABNORMAL LOW (ref 13.0–17.0)
MCH: 28.9 pg (ref 26.0–34.0)
MCHC: 30.7 g/dL (ref 30.0–36.0)
MCV: 94.1 fL (ref 78.0–100.0)
Platelets: 310 10*3/uL (ref 150–400)
RBC: 2.7 MIL/uL — ABNORMAL LOW (ref 4.22–5.81)
RDW: 15.8 % — ABNORMAL HIGH (ref 11.5–15.5)
WBC: 6.3 10*3/uL (ref 4.0–10.5)

## 2017-08-11 LAB — HAPTOGLOBIN: Haptoglobin: 227 mg/dL — ABNORMAL HIGH (ref 34–200)

## 2017-08-11 LAB — GLUCOSE, CAPILLARY
Glucose-Capillary: >600 mg/dL (ref 65–99)
Glucose-Capillary: >600 mg/dL (ref 65–99)
Glucose-Capillary: >600 mg/dL (ref 65–99)
Glucose-Capillary: 506 mg/dL (ref 65–99)
Glucose-Capillary: 563 mg/dL (ref 65–99)
Glucose-Capillary: 463 mg/dL — ABNORMAL HIGH (ref 65–99)
Glucose-Capillary: 414 mg/dL — ABNORMAL HIGH (ref 65–99)
Glucose-Capillary: 363 mg/dL — ABNORMAL HIGH (ref 65–99)
Glucose-Capillary: 300 mg/dL — ABNORMAL HIGH (ref 65–99)
Glucose-Capillary: 216 mg/dL — ABNORMAL HIGH (ref 65–99)
Glucose-Capillary: 156 mg/dL — ABNORMAL HIGH (ref 65–99)
Glucose-Capillary: 87 mg/dL (ref 65–99)
Glucose-Capillary: 146 mg/dL — ABNORMAL HIGH (ref 65–99)
Glucose-Capillary: 135 mg/dL — ABNORMAL HIGH (ref 65–99)
Glucose-Capillary: 162 mg/dL — ABNORMAL HIGH (ref 65–99)

## 2017-08-11 MED ORDER — INSULIN ASPART 100 UNIT/ML ~~LOC~~ SOLN
0.0000 [IU] | Freq: Three times a day (TID) | SUBCUTANEOUS | Status: DC
  Administered 2017-08-12 (×2): 2 [IU] via SUBCUTANEOUS
  Administered 2017-08-12 – 2017-08-13 (×2): 5 [IU] via SUBCUTANEOUS
  Administered 2017-08-13: 3 [IU] via SUBCUTANEOUS

## 2017-08-11 MED ORDER — SODIUM CHLORIDE 0.9 % IV SOLN
INTRAVENOUS | Status: DC
  Administered 2017-08-11: 5 [IU]/h via INTRAVENOUS
  Administered 2017-08-11: 4 [IU]/h via INTRAVENOUS
  Filled 2017-08-11: qty 1

## 2017-08-11 MED ORDER — SODIUM CHLORIDE 0.9 % IV BOLUS (SEPSIS)
1000.0000 mL | Freq: Once | INTRAVENOUS | Status: AC
  Administered 2017-08-11: 1000 mL via INTRAVENOUS

## 2017-08-11 MED ORDER — INSULIN ASPART 100 UNIT/ML ~~LOC~~ SOLN
14.0000 [IU] | Freq: Once | SUBCUTANEOUS | Status: AC
  Administered 2017-08-11: 14 [IU] via SUBCUTANEOUS

## 2017-08-11 MED ORDER — GLUCERNA SHAKE PO LIQD
237.0000 mL | Freq: Three times a day (TID) | ORAL | Status: DC
  Administered 2017-08-11 (×2): 237 mL via ORAL

## 2017-08-11 MED ORDER — INSULIN ASPART 100 UNIT/ML ~~LOC~~ SOLN
3.0000 [IU] | Freq: Three times a day (TID) | SUBCUTANEOUS | Status: DC
  Administered 2017-08-12 (×3): 3 [IU] via SUBCUTANEOUS

## 2017-08-11 MED ORDER — INSULIN REGULAR BOLUS VIA INFUSION
0.0000 [IU] | Freq: Three times a day (TID) | INTRAVENOUS | Status: DC
  Administered 2017-08-11: 6 [IU] via INTRAVENOUS
  Administered 2017-08-11: 2 [IU] via INTRAVENOUS
  Filled 2017-08-11: qty 10

## 2017-08-11 MED ORDER — DEXTROSE-NACL 5-0.45 % IV SOLN
INTRAVENOUS | Status: DC
  Administered 2017-08-11: 20:00:00 via INTRAVENOUS

## 2017-08-11 MED ORDER — SODIUM CHLORIDE 0.9 % IV SOLN
INTRAVENOUS | Status: DC
  Administered 2017-08-11 – 2017-08-12 (×2): via INTRAVENOUS

## 2017-08-11 MED ORDER — DEXTROSE 50 % IV SOLN
25.0000 mL | INTRAVENOUS | Status: DC | PRN
  Administered 2017-08-12 (×2): 25 mL via INTRAVENOUS
  Filled 2017-08-11 (×2): qty 50

## 2017-08-11 MED ORDER — POTASSIUM CHLORIDE 10 MEQ/100ML IV SOLN
10.0000 meq | INTRAVENOUS | Status: AC
  Administered 2017-08-11 (×2): 10 meq via INTRAVENOUS
  Filled 2017-08-11 (×2): qty 100

## 2017-08-11 MED ORDER — INSULIN GLARGINE 100 UNIT/ML ~~LOC~~ SOLN
10.0000 [IU] | Freq: Every day | SUBCUTANEOUS | Status: DC
  Administered 2017-08-11: 10 [IU] via SUBCUTANEOUS
  Filled 2017-08-11 (×2): qty 0.1

## 2017-08-11 NOTE — Progress Notes (Signed)
PT Cancellation Note  Patient Details Name: Manish Ruggiero MRN: [id number] DOB: Jan 28, 1946   Cancelled Treatment:    Reason Eval/Treat Not Completed: Patient with medical issues, transferring to step down.   Duncan Dull 08/11/2017, 12:00 PM Alben Deeds, PT DPT  Board Certified Neurologic Specialist [phone]

## 2017-08-11 NOTE — Progress Notes (Signed)
Patient arrived from 5W to 4E room 18 on low bed and glucostabilizer.  Telemetry monitor applied and CCMD notified.  Telemonitor in room.  Patient oriented to unit and room to include call light and phone.  Will continue to monitor.

## 2017-08-11 NOTE — Progress Notes (Signed)
[facility] Progress Note  Patient: [name] [name] MEQ:[id number]   PCP: [name], [name] DOB: Mar 28, 1946   DOA: 08/01/2017   DOS: 08/11/2017   Date of Service: the patient was seen and examined on 08/11/2017  Subjective: Denies any nausea or vomiting.  No fever no chills.  No chest pain.  No diarrhea.  Brief hospital course: Pt. with PMH of type II DM, dementia, depression, anxiety, CVA, multiple admissions with poorly controlled diabetes (both hyper and hypoglycemia); admitted on 08/01/2017, presented with complaint of confusion and hypoglycemia, was found to have DKA, acute encephalopathy requiring intubation for airway protection.  Patient was admitted to ICU.  DKA resolved on 08/04/2017.  Extubated on 08/06/2017.  Also had sepsis and was given IV antibiotics. Currently further plan is controlled hyperglycemia  Assessment and Plan: 1.  Acute hypoxic respiratory failure. Acute metabolic encephalopathy. Required intubation, was with ICU, currently better but still with dysphagia. Closely monitor. Extubated on 08/05/2017.  2.  Sepsis with aspiration pneumonia. Bibasilar. Treated with IV vancomycin and Zosyn. Blood cultures negative so far. Completed 10-day treatment course with IV Zosyn.  3.  DKA. Type 2 diabetes mellitus, uncontrolled, both hyper and hypoglycemia. Hemoglobin A1c 9.9 and 06/27/2017. Multiple ER visits for both hyper and hypoglycemia. Unsure the patient is competent enough to manage his diabetes treatment at home on his own. Blood sugars are fluctuating for both hyper and hypo-glycemia. Patient will be advanced to dysphagia type I diet will monitor on current regimen. Patient sugar increased to 740 this morning.  Given subcutaneous insulin without any improvement.  Started on IV glucose stabilizer and transferred to stepdown unit for that.  Given IV normal saline bolus as well as continuous IV normal saline infusion.  No recurrence of DKA. At present the goal  is to control the CBG for less than 200 x 2.  After that the patient should be transitioned to Lantus 10 units, sensitive sliding scale insulin as well as 3 units of pre-meal coverage.  4.  Post extubation dysphagia. Aspiration. Patient likely presented with sepsis with aspiration pneumonia. Remains at high risk for aspiration after extubation Underwent modified barium swallow on 08/08/2017 as well as on 08/10/2017, which still shows high risk for aspiration and speech therapy is recommending dysphagia 1 diet with pudding thick consistency. Concern is that the patient will not be able to maintain hydration status on this thickness. Patient not a great candidate for FEES. Appreciate input from speech therapy.  5.  Acute kidney injury on chronic kidney disease stage II. Hyperkalemia. Metabolic acidosis.  Non-anion gap Hypernatremia. Hyperchloremia. Hyperkalemia currently resolved, patient shows hypokalemia. Renal function back to baseline for now. Metabolic acidosis still remains a concern although anion gap remains closed. Acidosis more likely due to hyperchloremia.  6.  Deconditioning. PT OT assessment.  7.  Anemia. No active bleeding, likely associated with acute sepsis. Monitor for now. Check anemia panel, if hemoglobin less than 7 transfuse.  8.  Goals of care discussion. Discussed with patient's daughter on the phone regarding his goals of care. Patient wants to go home wants to eat. Daughter wants the patient to remain full code. Continue to monitor for now. Full code for now.  Diet: Dysphagia type I diet DVT Prophylaxis: SCD  Advance goals of care discussion: full code  Family Communication: no family was present at bedside, at the time of interview.  Discussed with daughter on the phone Disposition:  Discharge to SNF  Consultants: PCCM primary admkission  Procedures: ETT, MBS  Antibiotics: Anti-infectives    Start     Dose/Rate Route Frequency Ordered Stop    08/05/17 1600  piperacillin-tazobactam (ZOSYN) IVPB 3.375 g  Status:  Discontinued     3.375 g 12.5 mL/hr over 240 Minutes Intravenous Every 8 hours 08/05/17 0818 08/10/17 0827   08/03/17 1600  vancomycin (VANCOCIN) IVPB 1000 mg/200 mL premix  Status:  Discontinued     1,000 mg 200 mL/hr over 60 Minutes Intravenous Every 48 hours 08/01/17 1630 08/04/17 1005   08/02/17 0000  piperacillin-tazobactam (ZOSYN) IVPB 2.25 g  Status:  Discontinued     2.25 g 100 mL/hr over 30 Minutes Intravenous Every 8 hours 08/01/17 1630 08/05/17 0818   08/01/17 1530  piperacillin-tazobactam (ZOSYN) IVPB 3.375 g     3.375 g 100 mL/hr over 30 Minutes Intravenous  Once 08/01/17 1523 08/01/17 1829   08/01/17 1530  vancomycin (VANCOCIN) IVPB 1000 mg/200 mL premix     1,000 mg 200 mL/hr over 60 Minutes Intravenous  Once 08/01/17 1523 08/01/17 1929       Objective: Physical Exam: Vitals:   08/11/17 0541 08/11/17 1023 08/11/17 1416 08/11/17 1721  BP: 138/66 (!) 167/75 135/73 135/77  Pulse: 63 61 76 64  Resp: 18 (!) 22 20 (!) 24  Temp: 99 F (37.2 C)  97.6 F (36.4 C) 98.1 F (36.7 C)  TempSrc: Oral  Axillary Oral  SpO2: 98% 99% 100% 100%  Weight:      Height:        Intake/Output Summary (Last 24 hours) at 08/11/17 1727 Last data filed at 08/11/17 0917  Gross per 24 hour  Intake             1630 ml  Output             1500 ml  Net              130 ml   Filed Weights   08/04/17 0156 08/05/17 0446 08/06/17 0500  Weight: 69.7 kg (153 lb 10.6 oz) 69 kg (152 lb 1.9 oz) 65.9 kg (145 lb 4.5 oz)   General: Alert, Awake and Oriented to Time, Place and Person. Appear in mild distress, affect appropriate Eyes: PERRL, Conjunctiva normal ENT: Oral Mucosa clear moist. Neck: no JVD, no Abnormal Mass Or lumps Cardiovascular: S1 and S2 Present, no Murmur, Peripheral Pulses Present Respiratory: normal respiratory effort, Bilateral Air entry equal and Decreased, no use of accessory muscle, bilateral Crackles, no  wheezes Abdomen: Bowel Sound present, Soft and no tenderness, no hernia Skin: no redness, no Rash, no induration Extremities: no Pedal edema, no calf tenderness Neurologic: Grossly no focal neuro deficit. Bilaterally Equal motor strength  Data Reviewed: CBC:  Recent Labs Lab 08/08/17 1016 08/09/17 0418 08/10/17 0544 08/10/17 1707 08/11/17 0554  WBC 6.9 6.2 8.0 7.0 6.3  HGB 7.9* 8.1* 7.3* 8.2* 7.8*  HCT 24.8* 24.6* 21.6* 25.5* 25.4*  MCV 90.8 90.1 88.5 89.8 94.1  PLT 184 193 235 285 825   Basic Metabolic Panel:  Recent Labs Lab 08/05/17 0435  08/07/17 0500  08/09/17 0418 08/10/17 0544 08/10/17 1456 08/11/17 0554 08/11/17 1107  NA 144  < > 149*  < > 142 142 141 135 136  K 4.2  < > 3.5  < > 3.1* 2.6* 3.1* 4.8 4.0  CL 110  < > 110  < > 109 112* 112* 106 110  CO2 25  < > 28  < > 18* 21* 21* 16* 15*  GLUCOSE 253*  < >  98  < > 202* 134* 141* 783* 668*  BUN 41*  < > 28*  < > 21* 14 13 25* 24*  CREATININE 3.10*  < > 2.36*  < > 2.00* 1.74* 1.59* 1.89* 1.84*  CALCIUM 8.0*  < > 8.7*  < > 8.3* 8.0* 8.0* 8.3* 8.3*  MG 1.9  --  1.8  --  1.9  --  1.7  --   --   PHOS 3.6  --  3.8  --   --   --   --   --   --   < > = values in this interval not displayed.  Liver Function Tests: No results for input(s): AST, ALT, ALKPHOS, BILITOT, PROT, ALBUMIN in the last 168 hours. No results for input(s): LIPASE, AMYLASE in the last 168 hours. No results for input(s): AMMONIA in the last 168 hours. Coagulation Profile: No results for input(s): INR, PROTIME in the last 168 hours. Cardiac Enzymes: No results for input(s): CKTOTAL, CKMB, CKMBINDEX, TROPONINI in the last 168 hours. BNP (last 3 results) No results for input(s): PROBNP in the last 8760 hours. CBG:  Recent Labs Lab 08/11/17 1237 08/11/17 1341 08/11/17 1449 08/11/17 1554 08/11/17 1656  GLUCAP 463* 414* 363* 300* 216*   Studies: No results found.  Scheduled Meds: . aspirin EC  81 mg Oral Daily  . feeding supplement  (GLUCERNA SHAKE)  237 mL Oral TID BM  . heparin  5,000 Units Subcutaneous Q8H  . insulin regular  0-10 Units Intravenous TID WC  . nitroGLYCERIN  0.4 mg Transdermal Daily  . potassium chloride  20 mEq Oral BID   Continuous Infusions: . sodium chloride 150 mL/hr at 08/11/17 1130  . insulin (NOVOLIN-R) infusion 4.7 Units/hr (08/11/17 1659)   PRN Meds: dextrose, hydrALAZINE, RESOURCE THICKENUP CLEAR  Time spent: 35 minutes  Author: Berle Mull, MD [facility] Pager: [phone] 08/11/2017 5:27 PM  If 7PM-7AM, please contact night-coverage at [email or web address], password [facility]

## 2017-08-11 NOTE — Progress Notes (Signed)
NURSING PROGRESS NOTE  [name] [name] [id number] Transfer Data: 08/11/2017 2:31 PM Attending Provider: Lavina Hamman, MD [id number], Carroll Sage, [name] Code Status:  Full    [name] [name] is a 71 y.o. male patient transferred to [place]   -No acute distress noted.  -No complaints of shortness of breath.  -No complaints of chest pain.   Blood pressure 135/73, pulse 76, temperature 97.6 F (36.4 C), temperature source Axillary, resp. rate 20, height 5\' 6"  (1.676 m), weight 65.9 kg (145 lb 4.5 oz), SpO2 100 %.   IV Fluids:  IV in place, occlusive dsg intact without redness, IV cath forearm left, condition patent and no redness with insulin and normal saline infusing.   Allergies:  Patient has no known allergies.  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  Past Surgical History:   has a past surgical history that includes Hip Arthroplasty (Right, 02/05/2013) and Intramedullary (im) nail intertrochanteric (Left, 06/16/2016).  Social History:   reports that he has been smoking Cigarettes.  He has a 7.50 pack-year smoking history. He has never used smokeless tobacco. He reports that he does not drink alcohol or use drugs.  Report called to [facility] [place] prior to transfer.

## 2017-08-11 NOTE — Progress Notes (Signed)
   08/11/17 1023  Vitals  BP (!) 167/75  MAP (mmHg) 98  BP Location Left Arm  BP Method Automatic  Patient Position (if appropriate) Sitting  Pulse Rate 61  Pulse Rate Source Dinamap  Resp (!) 22  Oxygen Therapy  SpO2 99 %  O2 Device Room Air   CBG still grater than 600 after bolus and insulin. Provider notified

## 2017-08-11 NOTE — Progress Notes (Signed)
CRITICAL VALUE STICKER  CRITICAL VALUE: glucose 783  RECEIVER (on-site recipient of call): [name] [name]  DATE & TIME NOTIFIED: 08/11/17 @ 7:54 am  MD NOTIFIED: Posey Pronto  TIME OF NOTIFICATION: 7:55 am  RESPONSE: NS bolus 1L

## 2017-08-11 NOTE — Progress Notes (Signed)
SLP Cancellation Note  Patient Details Name: [name] [name] MRN: [id number] DOB: 1946-02-19   Cancelled treatment:       Reason Eval/Treat Not Completed: Medical issues which prohibited therapy   Juan Quam Laurice 08/11/2017, 11:26 AM

## 2017-08-11 NOTE — Progress Notes (Signed)
Nutrition Follow-up  DOCUMENTATION CODES:   Not applicable  INTERVENTION:  1. Glucerna Shake po TID, each supplement provides 220 kcal and 10 grams of protein  NUTRITION DIAGNOSIS:   Inadequate oral intake related to dysphagia as evidenced by per patient/family report. -ongoing  GOAL:   Patient will meet greater than or equal to 90% of their needs -not meeting currently  MONITOR:   PO intake, Diet advancement, I & O's, Labs, Weight trends  ASSESSMENT:   Pt with PMH of uncontrolled type II DM, dementia, hx of CVA, depression and anxiety presents with AMS from DKA, intubated 10/20 to protect airway Extubated now on NDD1-pudding thick liquids, deemed high risk for aspiration following 2 MBS by Speech 10/27 and 10/29. Down 3 pounds from admit weight. Patient and family do not want tube-feeds. Patient wants to eat. Remains full code requiring thickening of liquids.   Refused to get out of bed for therapies yesterday. Discussed with RN. Appears patient received multiple oral nutrition supplements last night, which led to his CBGs this morning. Patient was not hungry for breakfast. PO was poor until last night.  Meal Completion: 20%  Labs reviewed:  CBGs > 600 Glucose 783 at 0554  Medications reviewed and include:  Insulin bolus 0-10 units TID with meals, Insulin gtt 20K+ BID  NS at 157mL/hr    Intake/Output Summary (Last 24 hours) at 08/11/17 1513 Last data filed at 08/11/17 0917  Gross per 24 hour  Intake             1630 ml  Output             2500 ml  Net             -870 ml  562mL UOP so far today   Diet Order:  DIET - DYS 1 Room service appropriate? Yes; Fluid consistency: Pudding Thick  EDUCATION NEEDS:   Not appropriate for education at this time  Skin:  Skin Assessment: Reviewed, no issues  Last BM:  08/10/2017 (type 7)  Height:   Ht Readings from Last 1 Encounters:  08/04/17 5\' 6"  (1.676 m)    Weight:   Wt Readings from Last 1 Encounters:   08/06/17 145 lb 4.5 oz (65.9 kg)    Ideal Body Weight:  64.5 kg  BMI:  Body mass index is 23.45 kg/m.  Estimated Nutritional Needs:   Kcal:  1600-1800  Protein:  85-100 grams  Fluid:  >/= 1.6 L/day  Satira Anis. Nik Gorrell, MS, RD LDN Inpatient Clinical Dietitian Pager [phone]

## 2017-08-12 DIAGNOSIS — R1312 Dysphagia, oropharyngeal phase: Secondary | ICD-10-CM

## 2017-08-12 DIAGNOSIS — N179 Acute kidney failure, unspecified: Secondary | ICD-10-CM

## 2017-08-12 DIAGNOSIS — D649 Anemia, unspecified: Secondary | ICD-10-CM

## 2017-08-12 LAB — BASIC METABOLIC PANEL
Anion gap: 11 (ref 5–15)
BUN: 17 mg/dL (ref 6–20)
CO2: 17 mmol/L — ABNORMAL LOW (ref 22–32)
Calcium: 8.7 mg/dL — ABNORMAL LOW (ref 8.9–10.3)
Chloride: 114 mmol/L — ABNORMAL HIGH (ref 101–111)
Creatinine, Ser: 1.36 mg/dL — ABNORMAL HIGH (ref 0.61–1.24)
GFR calc Af Amer: 58 mL/min — ABNORMAL LOW (ref >60)
GFR calc non Af Amer: 50 mL/min — ABNORMAL LOW (ref >60)
Glucose, Bld: 276 mg/dL — ABNORMAL HIGH (ref 65–99)
Potassium: 4.1 mmol/L (ref 3.5–5.1)
Sodium: 142 mmol/L (ref 135–145)

## 2017-08-12 LAB — CBC
HCT: 28.9 % — ABNORMAL LOW (ref 39.0–52.0)
Hemoglobin: 9.7 g/dL — ABNORMAL LOW (ref 13.0–17.0)
MCH: 29.9 pg (ref 26.0–34.0)
MCHC: 33.6 g/dL (ref 30.0–36.0)
MCV: 89.2 fL (ref 78.0–100.0)
Platelets: 258 10*3/uL (ref 150–400)
RBC: 3.24 MIL/uL — ABNORMAL LOW (ref 4.22–5.81)
RDW: 15.2 % (ref 11.5–15.5)
WBC: 15.3 10*3/uL — ABNORMAL HIGH (ref 4.0–10.5)

## 2017-08-12 LAB — GLUCOSE, CAPILLARY
Glucose-Capillary: 103 mg/dL — ABNORMAL HIGH (ref 65–99)
Glucose-Capillary: 109 mg/dL — ABNORMAL HIGH (ref 65–99)
Glucose-Capillary: 174 mg/dL — ABNORMAL HIGH (ref 65–99)
Glucose-Capillary: 187 mg/dL — ABNORMAL HIGH (ref 65–99)
Glucose-Capillary: 256 mg/dL — ABNORMAL HIGH (ref 65–99)
Glucose-Capillary: 40 mg/dL — CL (ref 65–99)
Glucose-Capillary: 52 mg/dL — ABNORMAL LOW (ref 65–99)
Glucose-Capillary: 91 mg/dL (ref 65–99)

## 2017-08-12 LAB — MAGNESIUM: Magnesium: 1.8 mg/dL (ref 1.7–2.4)

## 2017-08-12 NOTE — Progress Notes (Signed)
Inpatient Diabetes Program Recommendations  AACE/ADA: New Consensus Statement on Inpatient Glycemic Control (2015)  Target Ranges:  Prepandial:   less than 140 mg/dL      Peak postprandial:   less than 180 mg/dL (1-2 hours)      Critically ill patients:  140 - 180 mg/dL   Lab Results  Component Value Date   GLUCAP 174 (H) 08/12/2017   HGBA1C 9.9 (H) 06/27/2017    Review of Glycemic Control Inpatient Diabetes Program Recommendations:    Noted hypoglycemia post correction + meal coverage. -Decrease Novolog meal coverage to 2 units if eats 50%  Thank you, Bethena Roys E. Saadia Dewitt, RN, MSN, CDE  Diabetes Coordinator Inpatient Glycemic Control Team Team Pager [phone] (8am-5pm) 08/12/2017 2:00 PM

## 2017-08-12 NOTE — Progress Notes (Signed)
Physical Therapy Treatment Patient Details Name: [name] [name] MRN: [id number] DOB: Oct 20, 1945 Today's Date: 08/12/2017    History of Present Illness 71 y.o. male admitted to [facility] on 08/01/17 for severe DKA, AMS, hyperglycemia, lactic acidosis, intubated 08/01/17-08/06/17.  Pt with other significant PMH of stroke, HTN, DM, L femur fx s/p IM nail, and R THA.      PT Comments    Pt very agitated during session. Min assist for gait due to lack of safety awareness. Pt attempted to stop at water fountain.Educated pt that he was not able to drink water at this time due to swallowing precautions. Educated pt on the importance of getting out of the bed. Declined sitting in recliner. Pt rushed into bed without regards to lines and tubes being tangled and underneath him.   Follow Up Recommendations  SNF;Supervision for mobility/OOB     Equipment Recommendations  None recommended by PT    Recommendations for Other Services       Precautions / Restrictions Precautions Precautions: Fall Restrictions Weight Bearing Restrictions: No    Mobility  Bed Mobility Overal bed mobility: Needs Assistance Bed Mobility: Supine to Sit;Sit to Supine     Supine to sit: Supervision;HOB elevated Sit to supine: Min assist   General bed mobility comments: min assist for sit to supine due to pt being agitated rushing back into bed without regards of lines  Transfers Overall transfer level: Needs assistance Equipment used: Rolling walker (2 wheeled) Transfers: Sit to/from Stand Sit to Stand: Min guard         General transfer comment: pt rushed to stand up without warning while SPTA getting lines situated. Vc needed to slow down during transfers  Ambulation/Gait Ambulation/Gait assistance: Min assist Ambulation Distance (Feet): 65 Feet Assistive device: Rolling walker (2 wheeled) Gait Pattern/deviations: Step-through pattern;Shuffle;Trunk flexed;Narrow base of support     General Gait Details: vc  for upright posture during when ambulating with RW. Pt very agitated vc to slow down when walking.    Stairs            Wheelchair Mobility    Modified Rankin (Stroke Patients Only)       Balance Overall balance assessment: Needs assistance Sitting-balance support: No upper extremity supported;Feet supported Sitting balance-Leahy Scale: Good     Standing balance support: Bilateral upper extremity supported;During functional activity Standing balance-Leahy Scale: Poor Standing balance comment: rely on RW for UE support                            Cognition Arousal/Alertness: Awake/alert Behavior During Therapy: WFL for tasks assessed/performed Overall Cognitive Status: History of cognitive impairments - at baseline Area of Impairment: Safety/judgement                       Following Commands: Follows one step commands consistently Safety/Judgement: Decreased awareness of safety;Decreased awareness of deficits   Problem Solving: Slow processing;Difficulty sequencing;Requires verbal cues;Requires tactile cues General Comments: during todays session pt decided to get up and lay down without regards to lines and tubes; very agitated      Exercises      General Comments        Pertinent Vitals/Pain Pain Assessment: 0-10 Pain Score: 8  Pain Location: legs Pain Descriptors / Indicators: Sore;Aching Pain Intervention(s): Monitored during session;Repositioned    Home Living  Prior Function            PT Goals (current goals can now be found in the care plan section) Acute Rehab PT Goals Patient Stated Goal: to get a drink of water PT Goal Formulation: With patient/family Potential to Achieve Goals: Good    Frequency    Min 3X/week      PT Plan Current plan remains appropriate    Co-evaluation              AM-PAC PT "6 Clicks" Daily Activity  Outcome Measure  Difficulty turning over in bed  (including adjusting bedclothes, sheets and blankets)?: Unable Difficulty moving from lying on back to sitting on the side of the bed? : Unable Difficulty sitting down on and standing up from a chair with arms (e.g., wheelchair, bedside commode, etc,.)?: Unable Help needed moving to and from a bed to chair (including a wheelchair)?: A Little Help needed walking in hospital room?: A Little Help needed climbing 3-5 steps with a railing? : A Lot 6 Click Score: 11    End of Session Equipment Utilized During Treatment: Gait belt Activity Tolerance: Other (comment) (Pt very agitated) Patient left: in bed;with call bell/phone within reach (Pt refused to get in recliner) Nurse Communication: Mobility status PT Visit Diagnosis: Unsteadiness on feet (R26.81);Muscle weakness (generalized) (M62.81);Difficulty in walking, not elsewhere classified (R26.2)     Time: 7943-2761 PT Time Calculation (min) (ACUTE ONLY): 21 min  Charges:  $Gait Training: 8-22 mins                    G Codes:  Functional Assessment Tool Used: AM-PAC 6 Clicks Basic Mobility    Fransisca Connors, SPTA    Fransisca Connors 08/12/2017, 2:28 PM

## 2017-08-12 NOTE — Progress Notes (Signed)
PROGRESS NOTE  [name] [name] IFO:[id number] DOB: [birth year] DOA: 08/01/2017 PCP: [name], [name]  Brief Narrative: 71 year old male with multiple issues related to either hyperglycemia/DKA or hypoglycemia secondary to insulin use without eating, recently discharged after treatment for DKA, who presented unresponsive and was found to have profound metabolic acidosis, acute kidney injury, hyperkalemia and was emergently intubated in the emergency department.  He was treated with aggressive care, subsequently extubated and acute kidney injury resolved.  Hospitalization was complicated by dysphagia requiring several days of n.p.o. status.  He has now been advanced.  Also noted to have had one fall.  Assessment/Plan Severe DKA superimposed on diabetes mellitus type 2 uncontrolled by hemoglobin A1c, with marked anion gap metabolic acidosis on admission, with associated metabolic encephalopathy -DKA resolved.  Blood sugars improved again today after being severely high yesterday. -Given recurrent severe hyperglycemia yesterday, will monitor today on subcutaneous insulin. -Patient has refused skilled nursing facility in the past.  According to the chart he is also refused home health and they have come to visit him.  Sepsis secondary to pneumonia, associated acute hypoxic respiratory failure.  Clinical diagnosis.  No radiographic evidence.  Hypoxia has resolved. -Completed 10-day course of Zosyn.  Acute kidney injury with hyperkalemia on admission -Creatinine continues to improve towards normal.  Potassium remains within normal limits.  Expect continued improvement.  Anemia critical illness superimposed on anemia of chronic disease -Stable.  Thrombocytopenia secondary to sepsis -Resolved.  Dysphagia, oropharyngeal phase -Dysphagia 1 (Puree) solids;Pudding thick liquid  Status post fall in the hospital, patient is CT had no acute abnormalities.  PMH stroke, DVT  Anticipate release from  hospital Nov 1, total per patient preference  DVT prophylaxis: heparin Code Status: full Family Communication: none Disposition Plan: home with [facility]    Murray Hodgkins, MD  [facility] Direct contact: [phone] --Via [place]  --[email or web address]; password [email or web address]  7PM-7AM contact night coverage as above 08/12/2017, 10:52 AM  LOS: 11 days   Consultants:  PCCM  Procedures:  Central line RIJ placement in the emergency department 10/20  Intubated 10/20 >> 10/25  Antimicrobials:  Zosyn 10/20 >>  Vancomycin 10/20 >> 10/23  Interval history/Subjective: Patient was transferred to stepdown yesterday after blood sugar increased to over 700.  Feels fine today.  No complaints.  No vomiting.  Tolerating diet.  Objective: Vitals: Afebrile, 100.6, 27, 66, 151/86, 99% on room air  Exam:     Constitutional: Appears calm, comfortable lying in bed.  Eyes.  Pupils, irises, lids appear unremarkable.  ENT.  Grossly normal hearing, lips, tongue.  Cardiovascular.  Regular rate and rhythm.  No murmur, rub or gallop.  Clear to auscultation bilaterally.  No wheezes, rales or rhonchi.  Normal respiratory effort.  Abdomen soft  Musculoskeletal.  Grossly normal tone and strength bilateral lower extremities.  Moves legs to command.  Psychiatric.  Grossly normal mood and affect.  Speech fluent and appropriate.  Oriented to self, location, month, year.   I have personally reviewed the following:   Labs:  Blood sugars yesterday, now well controlled, less than 300  Potassium within normal limits, anion gap within normal limits, creatinine improving, 1.36.  Magnesium within normal limits.  WBC has increased, 15.3.  Hemoglobin stable 9.7.  Imaging studies:  Chest x-ray no acute disease   Review and summation of old records:  According to discharge summary 10/16: Presented with altered mentation and hyperglycemia.  Admitted for DKA acute kidney injury, hyperkalemia.   Concern expressed for unintentional noncompliance with  diet and treatment.  Discharge summary 9/18: Admitted for hypoglycemia secondary to insulin use without eating.  Noted to have a history of poorly controlled and brittle diabetes secondary to noncompliance.  Discharge summary 7/27: Treated for DKA, acute kidney injury  Discharge summary 6/19: Treated for DKA.  Scheduled Meds: . aspirin EC  81 mg Oral Daily  . feeding supplement (GLUCERNA SHAKE)  237 mL Oral TID BM  . heparin  5,000 Units Subcutaneous Q8H  . insulin aspart  0-9 Units Subcutaneous TID WC  . insulin aspart  3 Units Subcutaneous TID WC  . insulin glargine  10 Units Subcutaneous QHS  . nitroGLYCERIN  0.4 mg Transdermal Daily  . potassium chloride  20 mEq Oral BID   Continuous Infusions: . sodium chloride Stopped (08/11/17 1956)    Active Problems:   DKA, type 2 ([place])   Respiratory failure ([place])   LOS: 11 days

## 2017-08-12 NOTE — Progress Notes (Signed)
Hypoglycemic Event  CBG: 40  Treatment: D50 IV 25 mL (patient asked for something to drink first- when he refused after 4 attempts, this was given at 11:05pm)  Symptoms: None  Follow-up CBG: Time:2220 CBG Result: (will be charted by pt's RN when time)  Possible Reasons for Event: Inadequate meal intake  Comments/MD notified:NP Schorr (per pt's RN)    Gildardo Griffes

## 2017-08-12 NOTE — Progress Notes (Signed)
Hypoglycemic Event  CBG: 40   Treatment: 15 GM carbohydrate snack  Symptoms: None  Follow-up CBG: Time:2319 CBG Result:103  Possible Reasons for Event: Inadequate meal intake  Comments/MD notified:n/a    Susann Givens, Dene Gentry

## 2017-08-12 NOTE — Progress Notes (Signed)
OT Cancellation Note  Patient Details Name: [name] [name] MRN: [id number] DOB: June 03, 1946   Cancelled Treatment:    Reason Eval/Treat Not Completed: Patient declined, no reason specified. OT arrived for treatment session. Pt reporting his feet are hurting today. Pt declining adamantly to get out of bed this afternoon for ADL participation raising voice and repeating "get out." Will check back as able.  Norman Herrlich, MS OTR/L  Pager: [phone]   Norman Herrlich 08/12/2017, 3:58 PM

## 2017-08-12 NOTE — Progress Notes (Addendum)
Patient is not agreeable to discharge to a SNF. Patient daughter was at bedside and stated since patient is refusing to go to SNF she would rather him discharge home. CSW signing off as patient no longer has needs   Rhea Pink, MSW,  [place]

## 2017-08-12 NOTE — Progress Notes (Signed)
PT Cancellation Note  Patient Details Name: Breven Guidroz MRN: [id number] DOB: Jan 19, 1946   Cancelled Treatment:    Reason Eval/Treat Not Completed: (P) Other (comment) (Nurse requested to wait to do PT due to pts blood sugar being too low.)   Fransisca Connors 08/12/2017, 11:53 AM   Fransisca Connors, SPTA

## 2017-08-12 NOTE — Progress Notes (Signed)
  Speech Language Pathology Treatment: Dysphagia  Patient Details Name: [name] [name] MRN: [id number] DOB: 09/18/1946 Today's Date: 08/12/2017 Time: 5573-2202 SLP Time Calculation (min) (ACUTE ONLY): 19 min  Assessment / Plan / Recommendation Clinical Impression  Pt has made very little clinical improvement in swallowing.  Difficult to discern etiology of deficits, but suspect a mild, chronic component that was exacerbated by five-day intubation and overall deconditioning.  Current diet - dysphagia 1, pudding-thick liquids - is not a practical diet for the long-term, leading to difficulty maintaining hydration, impacting quality of life, and being unreasonable knowing Mr. Winker proclivity for denying/disregarding certain recommendations.  This clinician spoke again today with Georganna Skeans, pt's daughter, and outlined: nature of pt's dysphagia; his lack of progress thus far; the likelihood that he will continue to aspirate trace amounts of food/liquid and our inability to prevent that, only minimize it.  We discussed issues of dehydration as a result of such a restrictive diet.  Ms Velardi verbalized being overwhelmed and unable to make a decision.  As our conversation progressed our call was disconnected and I was unable to reach her again after two attempts; I left a voice mail reiterating the basic points from our discussion.    SLP will follow for plan.    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 PMHx of DM, dementia, anxiety, depression, CVA.  ETT 10/20-10/25.       SLP Plan  Continue with current plan of care       Recommendations  Diet recommendations: Dysphagia 1 (puree);Pudding-thick liquid Liquids provided via: Teaspoon Medication Administration: Crushed with puree Supervision: Staff to assist with self feeding;Full supervision/cueing for compensatory strategies Compensations: Slow  rate;Small sips/bites;Effortful swallow Postural Changes and/or Swallow Maneuvers: Seated upright 90 degrees                Follow up Recommendations: Skilled Nursing facility SLP Visit Diagnosis: Dysphagia, oropharyngeal phase (R13.12) Plan: Continue with current plan of care       GO               Delaney Schnick L. Tivis Ringer, [place] CCC/SLP Pager [phone]  Juan Quam Laurice 08/12/2017, 2:55 PM

## 2017-08-13 DIAGNOSIS — E118 Type 2 diabetes mellitus with unspecified complications: Secondary | ICD-10-CM | POA: Diagnosis not present

## 2017-08-13 DIAGNOSIS — M6281 Muscle weakness (generalized): Secondary | ICD-10-CM | POA: Diagnosis not present

## 2017-08-13 DIAGNOSIS — J9601 Acute respiratory failure with hypoxia: Secondary | ICD-10-CM | POA: Diagnosis not present

## 2017-08-13 DIAGNOSIS — J189 Pneumonia, unspecified organism: Secondary | ICD-10-CM

## 2017-08-13 DIAGNOSIS — G9341 Metabolic encephalopathy: Secondary | ICD-10-CM | POA: Diagnosis not present

## 2017-08-13 DIAGNOSIS — E1310 Other specified diabetes mellitus with ketoacidosis without coma: Secondary | ICD-10-CM | POA: Diagnosis not present

## 2017-08-13 DIAGNOSIS — I6309 Cerebral infarction due to thrombosis of other precerebral artery: Secondary | ICD-10-CM | POA: Diagnosis not present

## 2017-08-13 DIAGNOSIS — E1111 Type 2 diabetes mellitus with ketoacidosis with coma: Secondary | ICD-10-CM | POA: Diagnosis not present

## 2017-08-13 DIAGNOSIS — E1159 Type 2 diabetes mellitus with other circulatory complications: Secondary | ICD-10-CM | POA: Diagnosis not present

## 2017-08-13 DIAGNOSIS — R633 Feeding difficulties: Secondary | ICD-10-CM | POA: Diagnosis not present

## 2017-08-13 DIAGNOSIS — Z9119 Patient's noncompliance with other medical treatment and regimen: Secondary | ICD-10-CM | POA: Diagnosis not present

## 2017-08-13 DIAGNOSIS — F419 Anxiety disorder, unspecified: Secondary | ICD-10-CM | POA: Diagnosis not present

## 2017-08-13 DIAGNOSIS — N183 Chronic kidney disease, stage 3 (moderate): Secondary | ICD-10-CM | POA: Diagnosis not present

## 2017-08-13 DIAGNOSIS — D631 Anemia in chronic kidney disease: Secondary | ICD-10-CM | POA: Diagnosis not present

## 2017-08-13 DIAGNOSIS — Z86718 Personal history of other venous thrombosis and embolism: Secondary | ICD-10-CM | POA: Diagnosis not present

## 2017-08-13 DIAGNOSIS — E1365 Other specified diabetes mellitus with hyperglycemia: Secondary | ICD-10-CM | POA: Diagnosis not present

## 2017-08-13 DIAGNOSIS — D6959 Other secondary thrombocytopenia: Secondary | ICD-10-CM | POA: Diagnosis not present

## 2017-08-13 DIAGNOSIS — Z8673 Personal history of transient ischemic attack (TIA), and cerebral infarction without residual deficits: Secondary | ICD-10-CM | POA: Diagnosis not present

## 2017-08-13 DIAGNOSIS — E1169 Type 2 diabetes mellitus with other specified complication: Secondary | ICD-10-CM | POA: Diagnosis not present

## 2017-08-13 DIAGNOSIS — D638 Anemia in other chronic diseases classified elsewhere: Secondary | ICD-10-CM | POA: Diagnosis not present

## 2017-08-13 DIAGNOSIS — E1110 Type 2 diabetes mellitus with ketoacidosis without coma: Secondary | ICD-10-CM | POA: Diagnosis not present

## 2017-08-13 DIAGNOSIS — R278 Other lack of coordination: Secondary | ICD-10-CM | POA: Diagnosis not present

## 2017-08-13 DIAGNOSIS — R1319 Other dysphagia: Secondary | ICD-10-CM | POA: Diagnosis not present

## 2017-08-13 DIAGNOSIS — D696 Thrombocytopenia, unspecified: Secondary | ICD-10-CM

## 2017-08-13 DIAGNOSIS — E119 Type 2 diabetes mellitus without complications: Secondary | ICD-10-CM | POA: Diagnosis not present

## 2017-08-13 DIAGNOSIS — R41841 Cognitive communication deficit: Secondary | ICD-10-CM | POA: Diagnosis not present

## 2017-08-13 DIAGNOSIS — R296 Repeated falls: Secondary | ICD-10-CM | POA: Diagnosis not present

## 2017-08-13 DIAGNOSIS — J9600 Acute respiratory failure, unspecified whether with hypoxia or hypercapnia: Secondary | ICD-10-CM | POA: Diagnosis not present

## 2017-08-13 DIAGNOSIS — A419 Sepsis, unspecified organism: Secondary | ICD-10-CM | POA: Diagnosis not present

## 2017-08-13 DIAGNOSIS — I10 Essential (primary) hypertension: Secondary | ICD-10-CM | POA: Diagnosis not present

## 2017-08-13 DIAGNOSIS — R1312 Dysphagia, oropharyngeal phase: Secondary | ICD-10-CM | POA: Diagnosis not present

## 2017-08-13 DIAGNOSIS — N179 Acute kidney failure, unspecified: Secondary | ICD-10-CM | POA: Diagnosis not present

## 2017-08-13 LAB — GLUCOSE, CAPILLARY
Glucose-Capillary: 189 mg/dL — ABNORMAL HIGH (ref 65–99)
Glucose-Capillary: 210 mg/dL — ABNORMAL HIGH (ref 65–99)
Glucose-Capillary: 276 mg/dL — ABNORMAL HIGH (ref 65–99)
Glucose-Capillary: 94 mg/dL (ref 65–99)

## 2017-08-13 MED ORDER — INSULIN GLARGINE 100 UNIT/ML ~~LOC~~ SOLN
5.0000 [IU] | Freq: Every day | SUBCUTANEOUS | Status: DC
  Administered 2017-08-13: 5 [IU] via SUBCUTANEOUS
  Filled 2017-08-13: qty 0.05

## 2017-08-13 MED ORDER — INSULIN GLARGINE 100 UNIT/ML SOLOSTAR PEN: 5.0000 [IU] | PEN_INJECTOR | Freq: Every day | SUBCUTANEOUS | Status: DC

## 2017-08-13 NOTE — Progress Notes (Signed)
Clinical Social Worker facilitated patient discharge including contacting patient family and facility to confirm patient discharge plans.  Clinical information faxed to facility and family agreeable with plan.  CSW arranged ambulance transport via [place] to [place] for 5pm .  RN to call [phone] (pt will go in room 304A) for report prior to discharge.  Clinical Social Worker will sign off for now as social work intervention is no longer needed. Please consult [place] again if new need arises.  Rhea Pink, MSW, [place]

## 2017-08-13 NOTE — Progress Notes (Signed)
Hypoglycemic Event  CBG: 40  Treatment: D50 IV 25 mL  Symptoms: None  Follow-up CBG: Time:2319 CBG Result:103  Possible Reasons for Event: Inadequate meal intake  Comments/MD notified:K. Schoor, NP Night time lantus held per K. Schoor  Unable to complete previous note by Gabriel Rainwater, RN.    Delle Reining

## 2017-08-13 NOTE — Discharge Summary (Signed)
Physician Discharge Summary  [name] QQP:[id number] DOB: Dec 03, 1945 DOA: 08/01/2017  PCP: [name], [name]  Admit date: 08/01/2017 Discharge date: 08/13/2017  Recommendations for Outpatient Follow-up:  1. Care for brittle DM, check CBG frequently, monitor for hyper and hypoglycemia. 2. ST, PT, OT at facility with attention to dysphagia.   Contact information for follow-up providers    [name], [name]. Schedule an appointment as soon as possible for a visit in 1 week(s).   Specialty:  Family Medicine Contact information: [place] [address] [phone]            Contact information for after-discharge care    Destination    [facility] Follow up.   Specialty:  [place] information: [address] [place] [phone]                   Discharge Diagnoses:  1. Severe DKA superimposed on diabetes mellitus type 2  2. Metabolic encephalopathy 3. Sepsis secondary to pneumonia,  4. Acute hypoxic respiratory failure.  5. Acute kidney injury with hyperkalemia  6. Anemia critical illness superimposed on anemia of chronic disease 7. Thrombocytopenia secondary to sepsis 8. Dysphagia, oropharyngeal phase  Discharge Condition: improved  Disposition: SNF  Diet recommendation:  Diet recommendations: Dysphagia 1 (puree);Pudding-thick liquid Liquids provided via: Teaspoon Medication Administration: Crushed with puree Supervision: Staff to assist with self feeding;Full supervision/cueing for compensatory strategies Compensations: Slow rate;Small sips/bites;Effortful swallow Postural Changes and/or Swallow Maneuvers: Seated upright 90 degrees  Filed Weights   08/05/17 0446 08/06/17 0500 08/13/17 0400  Weight: 69 kg (152 lb 1.9 oz) 65.9 kg (145 lb 4.5 oz) 63.5 kg (140 lb)    History of present illness:  71 year old male with multiple issues related to either  hyperglycemia/DKA or hypoglycemia secondary to insulin use without eating, recently discharged after treatment for DKA, who presented unresponsive and was found to have profound metabolic acidosis, acute kidney injury, hyperkalemia and was emergently intubated in the emergency department.    Hospital Course:  He was treated with aggressive care for pneumonia and DKA, subsequently transitioned to SQ insulin, extubated and acute kidney injury resolved. Pneumonia resolved with antibiotics. Hospitalization was complicated by dysphagia requiring several days of n.p.o. status.  He has now been advanced. Also noted to have had one fall. Had recurrent hyperglycemia prolonging stay, but glucose now stable. Seen by PT with recommendation for SNF which patient has accepted.  Severe DKA superimposed on diabetes mellitus type 2 uncontrolled by hemoglobin A1c, with marked anion gap metabolic acidosis on admission, with associated metabolic encephalopathy -resolved with standard care.  Sepsis secondary to pneumonia, associated acute hypoxic respiratory failure.  Clinical diagnosis.  No radiographic evidence.  Hypoxia has resolved. -Completed 10-day course of Zosyn.  Acute kidney injury with hyperkalemia on admission -Creatinine trending towards normal.  Potassium remains within normal limits.  Expect continued improvement.  Anemia critical illness superimposed on anemia of chronic disease -Stable.  Thrombocytopenia secondary to sepsis -Resolved.  Dysphagia, oropharyngeal phase -diet as above  Status post fall in the hospital, patient is CT had no acute abnormalities.  Consultants:  PCCM  Procedures:  Central line RIJ placement in the emergency department 10/20  Intubated 10/20 >> 10/25  Antimicrobials:  Zosyn 10/20 >> 10/29  Vancomycin 10/20 >> 10/23  Today's assessment: S: hypoglycemic overnight Feels fine, no complaints.  O: Vitals:  Vitals:   08/13/17 0400 08/13/17 0802    BP: (!) 144/74 (!) 148/70  Pulse: (!) 59 (!) 59  Resp: (!) 22 20  Temp: 98.4 F (36.9 C) 97.9 F (36.6 C)  SpO2: 97% 97%     Constitutional:  Appears calm and comfortable Respiratory:  CTA bilaterally, no w/r/r.  Respiratory effort normale Cardiovascular:  RRR, no m/r/g No LE extremity edema   Psychiatric:  Mental status Mood, affect appropriate  CBG low last night, stable since 11 PM.  Modest leukocytosis noted 10/31. Significance unclear, patient afebrile and asymptomatic. No further evaluation suggested at this point.  Discharge Instructions   Allergies as of 08/13/2017   No Known Allergies     Medication List    TAKE these medications   amLODipine 10 MG tablet Commonly known as:  NORVASC TAKE 1 TABLET ONCE DAILY.   aspirin 81 MG EC tablet Take 1 tablet (81 mg total) by mouth daily.   FLUoxetine 40 MG capsule Commonly known as:  PROZAC Take 1 capsule (40 mg total) by mouth daily.   glucagon 1 MG injection Commonly known as:  GLUCAGON EMERGENCY Inject 1 mg into the vein once as needed.   insulin aspart 100 UNIT/ML FlexPen Commonly known as:  NOVOLOG Inject 3 Units into the skin 3 (three) times daily with meals. For blood sugars greater than 150 only   Insulin Glargine 100 UNIT/ML Solostar Pen Commonly known as:  LANTUS SOLOSTAR Inject 5 Units into the skin daily at 10 pm. What changed:  how much to take      No Known Allergies  The results of significant diagnostics from this hospitalization (including imaging, microbiology, ancillary and laboratory) are listed below for reference.    Significant Diagnostic Studies: Dg Chest 2 View  Result Date: 07/25/2017 CLINICAL DATA:  Diabetic ketoacidosis. EXAM: CHEST  2 VIEW COMPARISON:  Chest x-ray dated June 27, 2017. FINDINGS: The cardiomediastinal silhouette is normal in size. Normal pulmonary vascularity. No focal consolidation, pleural effusion, or pneumothorax. Unchanged compression  deformities of T6 and T7. No acute osseous abnormality. IMPRESSION: No active cardiopulmonary disease. Electronically Signed   By: Titus Dubin M.D.   On: 07/25/2017 09:43   Ct Head Wo Contrast  Result Date: 08/09/2017 CLINICAL DATA:  Combative, did not want to wake up after Ambien. History of hypertension, diabetes and stroke. EXAM: CT HEAD WITHOUT CONTRAST TECHNIQUE: Contiguous axial images were obtained from the base of the skull through the vertex without intravenous contrast. COMPARISON:  CT HEAD December 13, 2016 FINDINGS: BRAIN: No intraparenchymal hemorrhage, mass effect nor midline shift. The ventricles and sulci are normal for age. Patchy supratentorial white matter hypodensities less than expected for patient's age, though non-specific are most compatible with chronic small vessel ischemic disease. No acute large vascular territory infarcts. No abnormal extra-axial fluid collections. Basal cisterns are patent. VASCULAR: Moderate calcific atherosclerosis of the carotid siphons. SKULL: No skull fracture. No significant scalp soft tissue swelling. SINUSES/ORBITS: Small LEFT sphenoid sinus air-fluid level. Mastoid air cells are well aerated. The included ocular globes and orbital contents are non-suspicious. OTHER: None. IMPRESSION: Stable negative noncontrast CT HEAD. Electronically Signed   By: Elon Alas M.D.   On: 08/09/2017 01:58   Dg Chest Port 1 View  Result Date: 08/06/2017 CLINICAL DATA:  Respiratory failure EXAM: PORTABLE CHEST 1 VIEW COMPARISON:  08/05/2017 FINDINGS: Endotracheal tube, nasogastric catheter and right jugular central line are again noted and stable. Cardiac shadow is stable. Bibasilar atelectatic changes are again seen without significant change. No acute bony abnormality is noted. IMPRESSION: Stable bibasilar atelectasis. Tubes and lines as  described. Electronically Signed   By: Inez Catalina M.D.   On: 08/06/2017 07:30   Dg Chest Port 1 View  Result Date:  08/05/2017 CLINICAL DATA:  Respiratory failure.  Ventilator support. EXAM: PORTABLE CHEST 1 VIEW COMPARISON:  08/03/2017 FINDINGS: Endotracheal tube tip is 3 cm above the carina. Nasogastric tube enters the stomach. Right internal jugular central line tip is in the right atrium as seen yesterday. Moderate basilar atelectasis persists, slightly worsened particularly on the left. IMPRESSION: Endotracheal tube and nasogastric tube well positioned. Internal jugular central line tip again within the right atrium. Slight worsening of basilar atelectasis particularly on the left. Electronically Signed   By: Nelson Chimes M.D.   On: 08/05/2017 08:07   Dg Chest Port 1 View  Result Date: 08/03/2017 CLINICAL DATA:  Endotracheal tube.  Diabetic ketoacidosis. EXAM: PORTABLE CHEST 1 VIEW COMPARISON:  One-view chest x-ray 08/02/2017 FINDINGS: The endotracheal tube is stable, 4 cm above the carina. A right IJ line is stable, just be on the cavoatrial junction. A NG tube terminates in the stomach. The heart is mildly enlarged. There is progressive volume loss with bibasilar airspace disease, likely atelectasis. There is no edema or effusion. No pneumothorax is present. IMPRESSION: 1. Progressive volume loss and bibasilar airspace disease, likely atelectasis. 2. The support apparatus is stable. Electronically Signed   By: San Morelle M.D.   On: 08/03/2017 07:37   Portable Chest X-ray (1 View)  Result Date: 08/02/2017 CLINICAL DATA:  Respiratory failure. EXAM: PORTABLE CHEST 1 VIEW COMPARISON:  08/01/2017. FINDINGS: Stable and unchanged cardiomediastinal silhouette. ET tube has been pulled back, now 4.1 cm above carina, satisfactory position. Center venous catheter tip lies within the RIGHT atrium, approximately 4 cm below the cavoatrial junction. Rotated radiograph demonstrates mild vascular congestion without consolidation or edema. No effusion or pneumothorax. Slight worsening aeration compared with priors.  IMPRESSION: ET tube 4 cm above carina. Central venous catheter tip lies well within the RIGHT atrium. Mild vascular congestion is developing. Electronically Signed   By: Staci Righter M.D.   On: 08/02/2017 07:27   Dg Chest Portable 1 View  Result Date: 08/01/2017 CLINICAL DATA:  Status post intubation EXAM: PORTABLE CHEST 1 VIEW COMPARISON:  08/01/2017 FINDINGS: Cardiac shadow is stable. Right jugular central line is noted at the cavoatrial junction. Endotracheal tube is seen in the distal trachea approximately 1.7 cm above the carina. The lungs are well aerated bilaterally. No acute bony abnormality is seen. No focal infiltrate is noted. IMPRESSION: Tubes and lines as described.  No change from the prior exam. Electronically Signed   By: Inez Catalina M.D.   On: 08/01/2017 18:55   Dg Chest Port 1 View  Result Date: 08/01/2017 CLINICAL DATA:  Altered level of consciousness, increased respiratory rate and hypothermia. EXAM: PORTABLE CHEST 1 VIEW COMPARISON:  07/25/2017 and prior exams FINDINGS: The cardiomediastinal silhouette is unremarkable. There is no evidence of focal airspace disease, pulmonary edema, suspicious pulmonary nodule/mass, pleural effusion, or pneumothorax. No acute bony abnormalities are identified. IMPRESSION: No active disease. Electronically Signed   By: Margarette Canada M.D.   On: 08/01/2017 15:55   Dg Abd Portable 1v  Result Date: 08/02/2017 CLINICAL DATA:  Encounter for nasogastric tube placement. EXAM: PORTABLE ABDOMEN - 1 VIEW COMPARISON:  None. FINDINGS: Nasogastric tube has been placed and overlies the LEFT upper quadrant. Presumably this lies within the stomach, with the tip directed toward the fundus. Visualized bowel gas pattern appears nonobstructive. IMPRESSION: Nasogastric tube tip is directed upward  into the LEFT upper quadrant, likely into the fundus of the stomach. Electronically Signed   By: Staci Righter M.D.   On: 08/02/2017 14:51   Dg Swallowing Func-speech  Pathology  Result Date: 08/10/2017 Objective Swallowing Evaluation: Type of Study: MBS-Modified Barium Swallow Study Patient Details Name: [name] [name] MRN: [id number] Date of Birth: 08-06-1946 Today's Date: 08/10/2017 Time: SLP Start Time (ACUTE ONLY): 1300-SLP Stop Time (ACUTE ONLY): 1345 SLP Time Calculation (min) (ACUTE ONLY): 45 min Past Medical History: Past Medical History: Diagnosis Date . 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 Closed comminuted intertrochanteric fracture of left femur ([place])  . Diabetes mellitus without complication ([place])  . Diabetic hyperosmolar non-ketotic state ([place]) 05/15/2016 . DKA (diabetic ketoacidoses) ([place]) 05/09/2016 . Hypertension  . Postoperative anemia due to acute blood loss 06/18/2016 . Retroperitoneal hematoma 06/18/2016 . Stroke ([place])  . Vitamin B 12 deficiency 06/18/2016 Past Surgical History: Past Surgical History: Procedure Laterality Date . HIP ARTHROPLASTY Right 02/05/2013  Procedure: ARTHROPLASTY BIPOLAR HIP;  Surgeon: [name], MD;  Location: [facility];  Service: Orthopedics;  Laterality: Right; . INTRAMEDULLARY (IM) NAIL INTERTROCHANTERIC Left 06/16/2016  Procedure: INTRAMEDULLARY (IM) NAIL INTERTROCHANTRIC;  Surgeon: Rod Can, MD;  Location: [facility];  Service: Orthopedics;  Laterality: Left; HPI: 71 yo male with brittle diabetes presented with altered mental status from DKA, lactic acidosis, VDRF with compromised airway, and sepsis. PMHx of DM, dementia, anxiety, depression, CVA.  ETT 10/20-10/25.  Subjective: pleasant, cooperative Assessment / Plan / Recommendation CHL IP CLINICAL IMPRESSIONS 08/10/2017 Clinical Impression Pt presents with persisting dysphagia notable for trace aspiration of thin, nectar, and honey-thick consistencies - acute source is likely to be laryngeal trauma s/p five-day intubation, but pt also presents with reduced propulsion of materials through pharynx and presence of osteophytes that may  impede full airway closure.  There is likely a mild, chronic component to his dysphagia.  Postural adjustments (head turns, tilts left and right and chin tuck) made only a marginal difference and did not reliably protect airway. Discussed results with pt, who viewed video in real time.  Barium that lined anterior trachea was identified for him so that he could visualize the aspiration, but he continued to deny that he is having any difficulty swallowing.  Results were also discussed with Dr. Posey Pronto and pt's daughter, Karna Christmas, via phone.  Safest POs that are least likely to be aspirated are dysphagia 1 solids and pudding-thick liquids, the latter unlikely to be accepted by the pt.  For today, recommend initiating this diet until pt/family accept the risks associated with eating/drinking according to his preferences.  SLP Visit Diagnosis Dysphagia, oropharyngeal phase (R13.12) Attention and concentration deficit following -- Frontal lobe and executive function deficit following -- Impact on safety and function Moderate aspiration risk;Risk for inadequate nutrition/hydration   CHL IP TREATMENT RECOMMENDATION 08/10/2017 Treatment Recommendations Therapy as outlined in treatment plan below   Prognosis 08/10/2017 Prognosis for Safe Diet Advancement Fair Barriers to Reach Goals -- Barriers/Prognosis Comment -- CHL IP DIET RECOMMENDATION 08/10/2017 SLP Diet Recommendations Dysphagia 1 (Puree) solids;Pudding thick liquid Liquid Administration via Spoon Medication Administration Crushed with puree Compensations Slow rate;Small sips/bites;Effortful swallow Postural Changes Remain semi-upright after after feeds/meals (Comment)   CHL IP OTHER RECOMMENDATIONS 08/10/2017 Recommended Consults -- Oral Care Recommendations Oral care BID Other Recommendations Order thickener from pharmacy   CHL IP FOLLOW UP RECOMMENDATIONS 08/10/2017 Follow up Recommendations Skilled Nursing facility   [facility] IP FREQUENCY AND DURATION 08/10/2017 Speech  Therapy  Frequency (ACUTE ONLY) min 2x/week Treatment Duration 2 weeks      CHL IP ORAL PHASE 08/10/2017 Oral Phase Impaired Oral - Pudding Teaspoon -- Oral - Pudding Cup -- Oral - Honey Teaspoon -- Oral - Honey Cup -- Oral - Nectar Teaspoon -- Oral - Nectar Cup -- Oral - Nectar Straw -- Oral - Thin Teaspoon -- Oral - Thin Cup -- Oral - Thin Straw -- Oral - Puree Weak lingual manipulation;Delayed oral transit Oral - Mech Soft -- Oral - Regular -- Oral - Multi-Consistency -- Oral - Pill -- Oral Phase - Comment --  CHL IP PHARYNGEAL PHASE 08/10/2017 Pharyngeal Phase Impaired Pharyngeal- Pudding Teaspoon -- Pharyngeal -- Pharyngeal- Pudding Cup -- Pharyngeal -- Pharyngeal- Honey Teaspoon Delayed swallow initiation-pyriform sinuses;Reduced epiglottic inversion;Reduced anterior laryngeal mobility;Reduced laryngeal elevation;Reduced airway/laryngeal closure;Reduced tongue base retraction;Penetration/Aspiration before swallow;Trace aspiration;Pharyngeal residue - valleculae Pharyngeal Material enters airway, passes BELOW cords without attempt by patient to eject out (silent aspiration) Pharyngeal- Honey Cup -- Pharyngeal -- Pharyngeal- Nectar Teaspoon -- Pharyngeal -- Pharyngeal- Nectar Cup -- Pharyngeal -- Pharyngeal- Nectar Straw -- Pharyngeal -- Pharyngeal- Thin Teaspoon Delayed swallow initiation-pyriform sinuses;Reduced epiglottic inversion;Reduced anterior laryngeal mobility;Reduced laryngeal elevation;Reduced airway/laryngeal closure;Reduced tongue base retraction;Penetration/Aspiration before swallow;Trace aspiration Pharyngeal Material enters airway, passes BELOW cords without attempt by patient to eject out (silent aspiration);Material enters airway, passes BELOW cords and not ejected out despite cough attempt by patient Pharyngeal- Thin Cup -- Pharyngeal -- Pharyngeal- Thin Straw -- Pharyngeal -- Pharyngeal- Puree Delayed swallow initiation-pyriform sinuses;Reduced epiglottic inversion;Reduced anterior laryngeal  mobility;Reduced laryngeal elevation;Reduced airway/laryngeal closure;Reduced tongue base retraction;Pharyngeal residue - valleculae Pharyngeal Material does not enter airway Pharyngeal- Mechanical Soft -- Pharyngeal -- Pharyngeal- Regular -- Pharyngeal -- Pharyngeal- Multi-consistency -- Pharyngeal -- Pharyngeal- Pill -- Pharyngeal -- Pharyngeal Comment --  CHL IP CERVICAL ESOPHAGEAL PHASE 08/08/2017 Cervical Esophageal Phase Impaired Pudding Teaspoon -- Pudding Cup -- Honey Teaspoon -- Honey Cup -- Nectar Teaspoon -- Nectar Cup -- Nectar Straw -- Thin Teaspoon -- Thin Cup -- Thin Straw -- Puree -- Mechanical Soft -- Regular -- Multi-consistency -- Pill -- Cervical Esophageal Comment -- No flowsheet data found. Juan Quam Laurice 08/10/2017, 2:04 PM               Labs: Basic Metabolic Panel:  Recent Labs Lab 08/07/17 0500  08/09/17 0418  08/10/17 1456 08/11/17 0554 08/11/17 1107 08/11/17 2007 08/12/17 0253  NA 149*  < > 142  < > 141 135 136 141 142  K 3.5  < > 3.1*  < > 3.1* 4.8 4.0 3.3* 4.1  CL 110  < > 109  < > 112* 106 110 115* 114*  CO2 28  < > 18*  < > 21* 16* 15* 18* 17*  GLUCOSE 98  < > 202*  < > 141* 783* 668* 103* 276*  BUN 28*  < > 21*  < > 13 25* 24* 19 17  CREATININE 2.36*  < > 2.00*  < > 1.59* 1.89* 1.84* 1.40* 1.36*  CALCIUM 8.7*  < > 8.3*  < > 8.0* 8.3* 8.3* 8.5* 8.7*  MG 1.8  --  1.9  --  1.7  --   --   --  1.8  PHOS 3.8  --   --   --   --   --   --   --   --   < > = values in this interval not displayed.  CBC:  Recent Labs Lab 08/09/17 0418 08/10/17 0544 08/10/17 1707 08/11/17 8546  08/12/17 0253  WBC 6.2 8.0 7.0 6.3 15.3*  HGB 8.1* 7.3* 8.2* 7.8* 9.7*  HCT 24.6* 21.6* 25.5* 25.4* 28.9*  MCV 90.1 88.5 89.8 94.1 89.2  PLT 193 235 285 310 258    CBG:  Recent Labs Lab 08/12/17 2319 08/13/17 0001 08/13/17 0409 08/13/17 0605 08/13/17 1125  GLUCAP 103* 109* 189* 210* 276*    Active Problems:   DKA, type 2 ([place])   Respiratory failure  ([place])   Time coordinating discharge: 35 minutes  Signed:  Murray Hodgkins, MD [facility] 08/13/2017, 2:09 PM

## 2017-08-13 NOTE — Care Management Note (Signed)
Case Management Note Marvetta Gibbons RN, BSN Unit 4E-Case Manager [phone]  Patient Details  Name: [name] [name] MRN: [id number] Date of Birth: 01-09-1946  Subjective/Objective:    Pt admitted with severe DKA, sepsis secondary to PNA,  AKI              Action/Plan: PTA pt lived at home alone, pt is well known to this CM from previous admission- daughter checks on pt but works during day- pt has refused SNF in past- and has been active with [facility] under [place] (high risk initiative) but unfortunately pt refuses to let [facility] go out to do their visits. CSW has seen pt regarding SNF on this admission- pt agreeable however this may change prior to discharge- CM to follow.   Expected Discharge Date:  08/12/17               Expected Discharge Plan:  [place]  In-House Referral:  Clinical Social Work  Discharge planning Services  CM Consult  Post Acute Care Choice:  Home Health, Resumption of Svcs/PTA Provider Choice offered to:  Adult Children, Patient  DME Arranged:    DME Agency:     HH Arranged:    HH Agency:     Status of Service:  In process, will continue to follow  If discussed at Long Length of Stay Meetings, dates discussed:    Discharge Disposition:   Additional Comments:  Dawayne Patricia, RN 08/13/2017, 2:01 PM

## 2017-08-13 NOTE — Progress Notes (Addendum)
SLP Cancellation Note  Patient Details Name: [name] [name] MRN: [id number] DOB: 11-06-1945   Cancelled treatment:        Pt asleep on arrival. Responded to SLP 's voice and briefly opened eyes. Explained to pt goal goal for possibility of initiating Mare Ferrari water protocol following order care as he is not currently on pudding thick "liquids". Pt verbally refused several times. Had pt open eyes to look at cup to ensure comprehension and he continued to refuse. SLP's thorough note read from yesterday re: SLP's conversation with pt's daughter who didn't voice willingness to accept any aspiration risk at that time. No family present. ST will continue efforts.   Houston Siren 08/13/2017, 11:44 AM

## 2017-08-13 NOTE — Progress Notes (Signed)
Physical Therapy Treatment Patient Details Name: [name] [name] MRN: [id number] DOB: 12-09-1945 Today's Date: 08/13/2017    History of Present Illness 71 y.o. male admitted to [facility] on 08/01/17 for severe DKA, AMS, hyperglycemia, lactic acidosis, intubated 08/01/17-08/06/17.  Pt with other significant PMH of stroke, HTN, DM, L femur fx s/p IM nail, and R THA.      PT Comments    Dtr present during today's session. Pt declined PT at beginning of session. Lots of encouragement needed from SPTA and Dtr today to get pt participation. Educated pt the importance of therapy before heading to a facility. Pt agreed to strengthening exercises in supine. Cues needed for slow and control movements during exercises. Eventually agreed to walk. VC for sequencing and erect posture for safety during gait training.   Follow Up Recommendations  SNF;Supervision for mobility/OOB     Equipment Recommendations       Recommendations for Other Services       Precautions / Restrictions Precautions Precautions: Fall Restrictions Weight Bearing Restrictions: No    Mobility  Bed Mobility Overal bed mobility: Needs Assistance Bed Mobility: Supine to Sit;Sit to Supine     Supine to sit: Supervision;HOB elevated (supervision for safety) Sit to supine: Min guard   General bed mobility comments: min guard for safety  Transfers Overall transfer level: Needs assistance Equipment used: Rolling walker (2 wheeled) Transfers: Sit to/from Stand Sit to Stand: Min guard         General transfer comment: vc for proper hand placement and power up when standing  Ambulation/Gait Ambulation/Gait assistance: Min guard Ambulation Distance (Feet): 60 Feet Assistive device: Rolling walker (2 wheeled) Gait Pattern/deviations: Step-through pattern;Shuffle;Trunk flexed;Narrow base of support Gait velocity: decreased   General Gait Details: vc for upright posture, increase step height when ambulating with RW.     Stairs            Wheelchair Mobility    Modified Rankin (Stroke Patients Only)       Balance Overall balance assessment: Needs assistance Sitting-balance support: No upper extremity supported;Feet supported Sitting balance-Leahy Scale: Good     Standing balance support: Bilateral upper extremity supported;During functional activity Standing balance-Leahy Scale: Poor Standing balance comment: rely on RW for UE support                            Cognition Arousal/Alertness: Awake/alert Behavior During Therapy: WFL for tasks assessed/performed Overall Cognitive Status: History of cognitive impairments - at baseline Area of Impairment: Safety/judgement;Following commands                       Following Commands: Follows one step commands consistently Safety/Judgement: Decreased awareness of safety;Decreased awareness of deficits   Problem Solving: Slow processing;Difficulty sequencing;Requires verbal cues;Requires tactile cues General Comments: max encouragement needed today to participate in PT      Exercises General Exercises - Lower Extremity Ankle Circles/Pumps: AROM;10 reps;Both;Supine Quad Sets: 10 reps;Supine;AROM;Both Gluteal Sets: AROM;10 reps;Supine Hip ABduction/ADduction: AROM;10 reps;Supine Straight Leg Raises: AROM;Both;10 reps;Supine Hip Flexion/Marching: AROM;Supine;15 reps;Both    General Comments        Pertinent Vitals/Pain Pain Score: 7  Pain Location: legs Pain Descriptors / Indicators: Sore;Aching Pain Intervention(s): Monitored during session;Repositioned    Home Living                      Prior Function  PT Goals (current goals can now be found in the care plan section) Progress towards PT goals: Progressing toward goals    Frequency    Min 3X/week      PT Plan Current plan remains appropriate    Co-evaluation              AM-PAC PT "6 Clicks" Daily Activity   Outcome Measure  Difficulty turning over in bed (including adjusting bedclothes, sheets and blankets)?: Unable Difficulty moving from lying on back to sitting on the side of the bed? : A Lot Difficulty sitting down on and standing up from a chair with arms (e.g., wheelchair, bedside commode, etc,.)?: A Lot Help needed moving to and from a bed to chair (including a wheelchair)?: A Little Help needed walking in hospital room?: A Little Help needed climbing 3-5 steps with a railing? : A Lot 6 Click Score: 13    End of Session Equipment Utilized During Treatment: Gait belt Activity Tolerance: Other (comment) (Pt slighty agitated) Patient left: in bed;with call bell/phone within reach;with family/visitor present Nurse Communication: Mobility status PT Visit Diagnosis: Unsteadiness on feet (R26.81);Muscle weakness (generalized) (M62.81);Difficulty in walking, not elsewhere classified (R26.2)     Time: 5277-8242 PT Time Calculation (min) (ACUTE ONLY): 31 min  Charges:  $Gait Training: 8-22 mins $Therapeutic Exercise: 8-22 mins                    G Codes:       Fransisca Connors, SPTA   Fransisca Connors 08/13/2017, 4:05 PM

## 2017-08-13 NOTE — Progress Notes (Signed)
Assumed care   Patient alert & oriented; patient denies pain; patient ate breakfast;   @1130  patient resting during rounding

## 2017-08-14 DIAGNOSIS — R1319 Other dysphagia: Secondary | ICD-10-CM | POA: Diagnosis not present

## 2017-08-14 DIAGNOSIS — D638 Anemia in other chronic diseases classified elsewhere: Secondary | ICD-10-CM | POA: Diagnosis not present

## 2017-08-14 DIAGNOSIS — E119 Type 2 diabetes mellitus without complications: Secondary | ICD-10-CM | POA: Diagnosis not present

## 2017-08-14 DIAGNOSIS — I10 Essential (primary) hypertension: Secondary | ICD-10-CM | POA: Diagnosis not present

## 2017-08-18 DIAGNOSIS — D638 Anemia in other chronic diseases classified elsewhere: Secondary | ICD-10-CM | POA: Diagnosis not present

## 2017-08-18 DIAGNOSIS — E119 Type 2 diabetes mellitus without complications: Secondary | ICD-10-CM | POA: Diagnosis not present

## 2017-08-18 DIAGNOSIS — I10 Essential (primary) hypertension: Secondary | ICD-10-CM | POA: Diagnosis not present

## 2017-08-19 DIAGNOSIS — D638 Anemia in other chronic diseases classified elsewhere: Secondary | ICD-10-CM | POA: Diagnosis not present

## 2017-08-19 DIAGNOSIS — I10 Essential (primary) hypertension: Secondary | ICD-10-CM | POA: Diagnosis not present

## 2017-08-19 DIAGNOSIS — E119 Type 2 diabetes mellitus without complications: Secondary | ICD-10-CM | POA: Diagnosis not present

## 2017-08-22 IMAGING — DX DG KNEE 1-2V PORT*L*
4 series · 5 of 5 positions shown · non-contrast
Comparison: None.

CLINICAL DATA: Left knee pain today. No known injury. Initial
encounter.

EXAM:
PORTABLE LEFT KNEE - 1-2 VIEW

[knee ap]
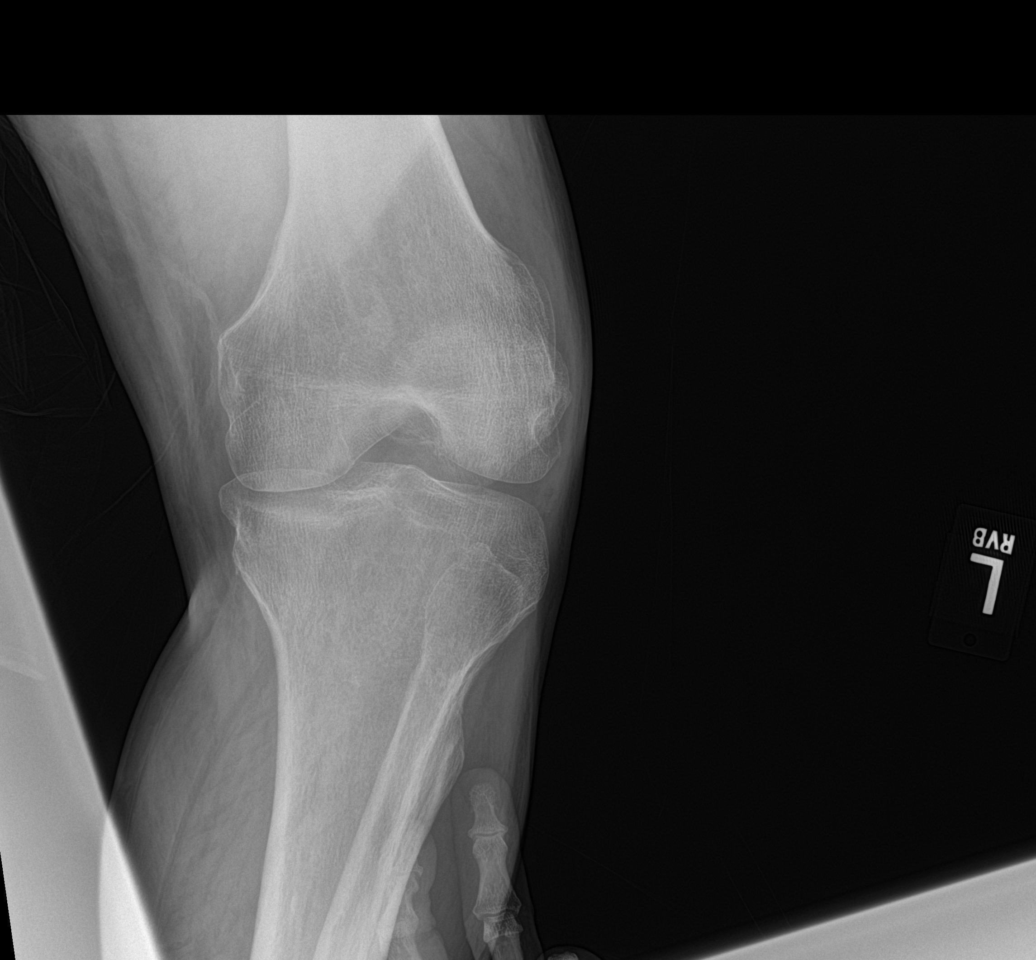

[Series 2: knee lat · 0.14mm/px · 2 of 2 slices shown]
[im 1/2]
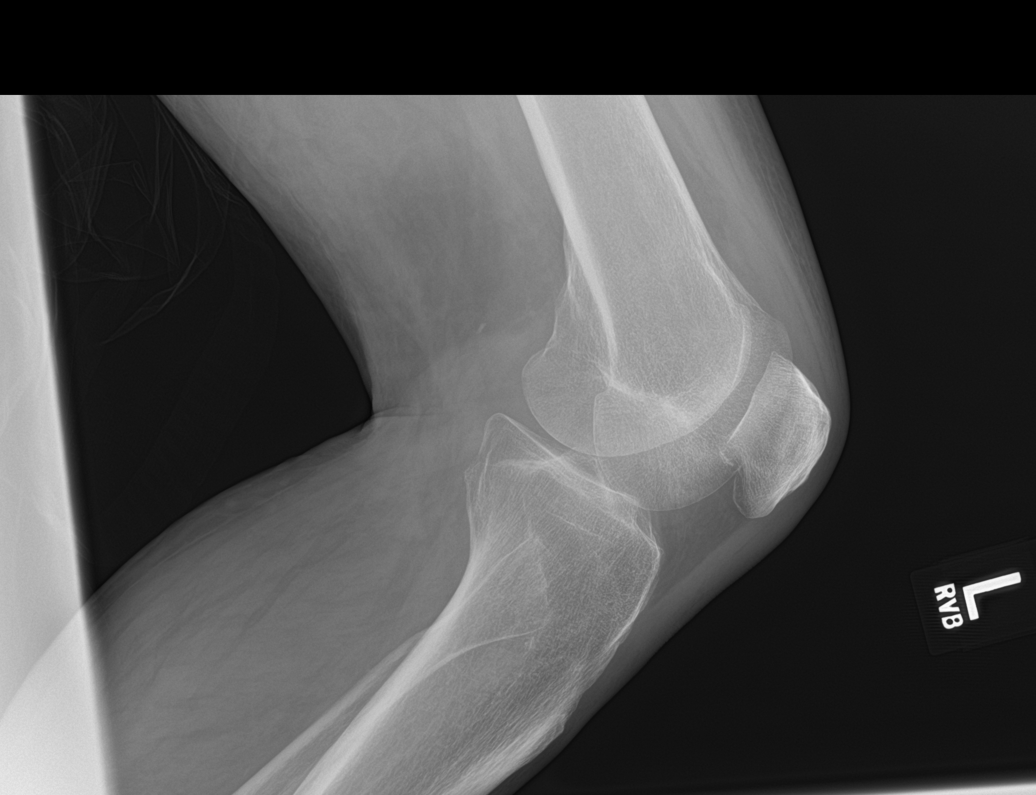
[im 2/2]
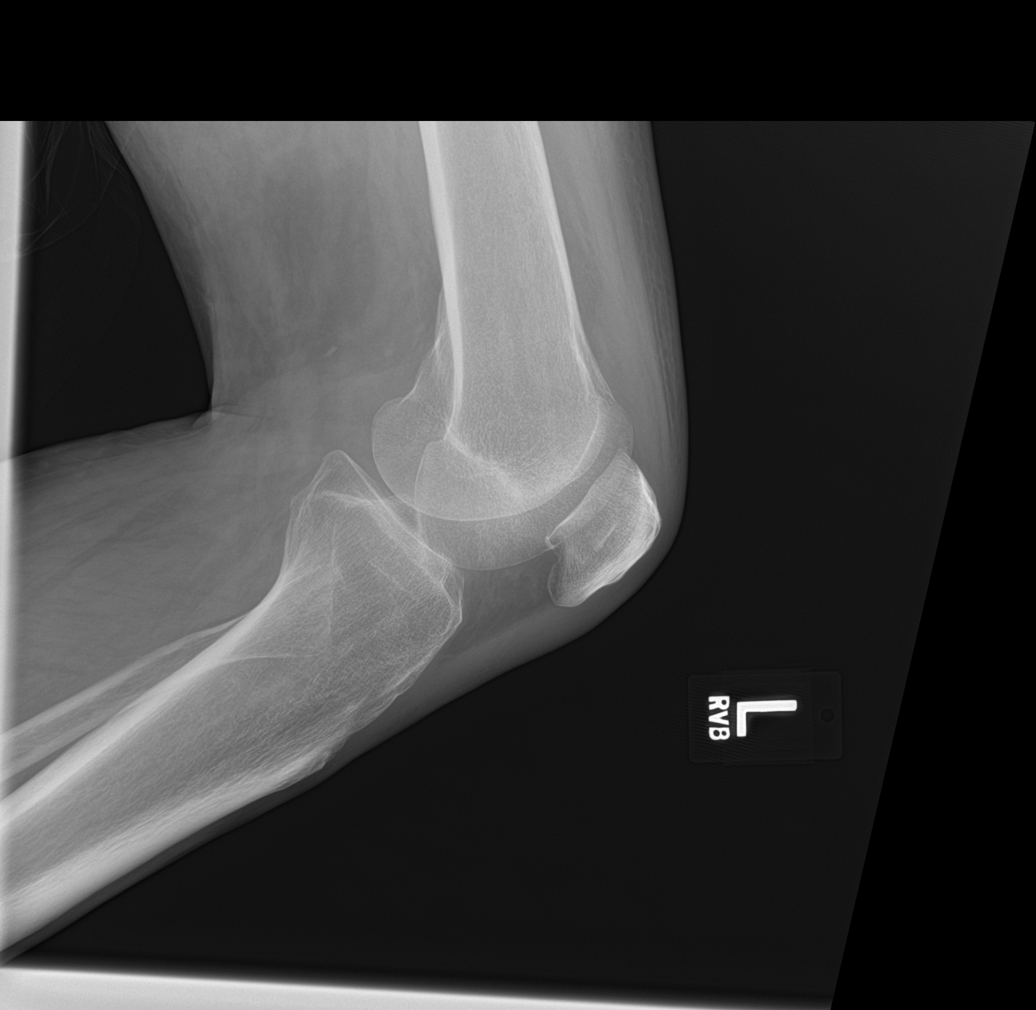

[knee obl (1 of 2)]
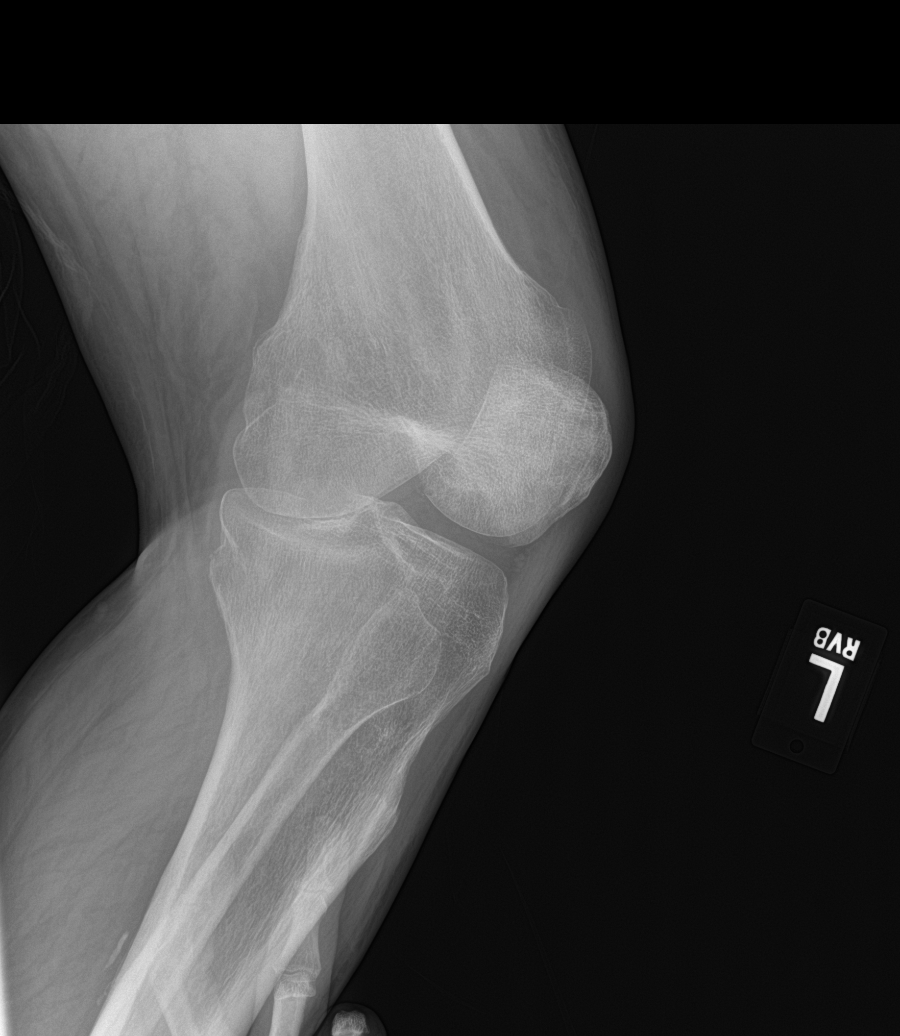

[knee obl (2 of 2)]
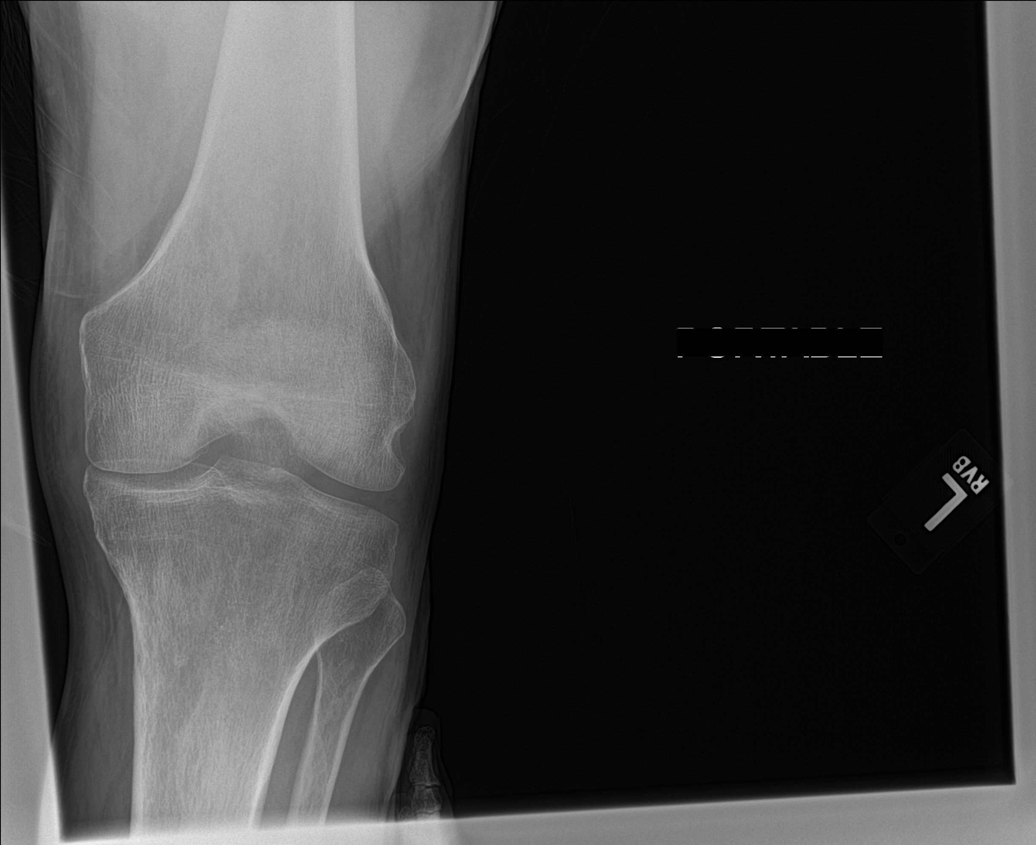

[5 of 5 positions shown; findings below may reference images not displayed]

FINDINGS: No evidence of fracture, dislocation, or joint effusion. No evidence
of arthropathy or other focal bone abnormality. Soft tissues are
unremarkable.
IMPRESSION: Negative exam.

## 2017-08-22 IMAGING — CT CT HEAD W/O CM
4 of 7 series · 16 of 47 positions shown, 17 images · non-contrast
Comparison: CT of the head performed 05/15/2016, and CT of the
cervical spine performed 08/07/2015. MRI of the brain performed
07/07/2015

CLINICAL DATA: Status post fall, hitting head. Concern for head or
cervical spine injury. Initial encounter.

EXAM:
CT HEAD WITHOUT CONTRAST
CT CERVICAL SPINE WITHOUT CONTRAST
TECHNIQUE: Multidetector CT imaging of the head and cervical spine was
performed following the standard protocol without intravenous
contrast. Multiplanar CT image reconstructions of the cervical spine
were also generated.

[Series 3: head w/o · axial · non-contrast · 0.43mm/px · z∈[-135,-50]mm · 4 of 29 slices shown, 5 images]
[im 6/29  brain]
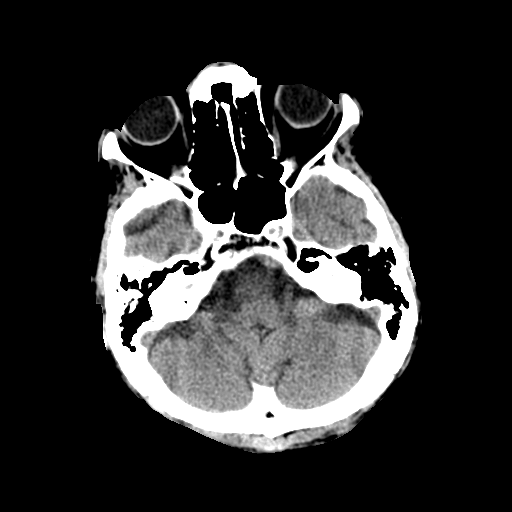
[im 6/29  bone]
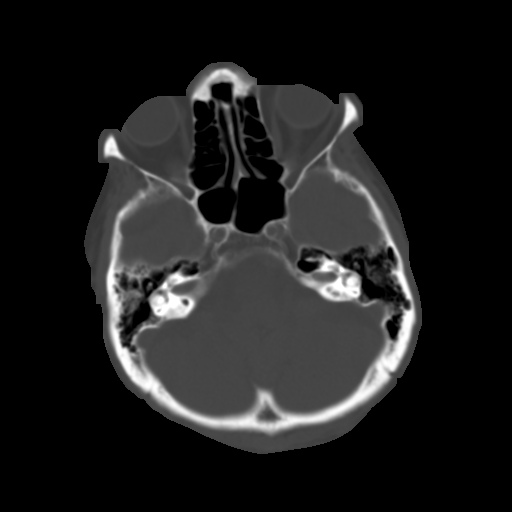
[im 12/29  brain]
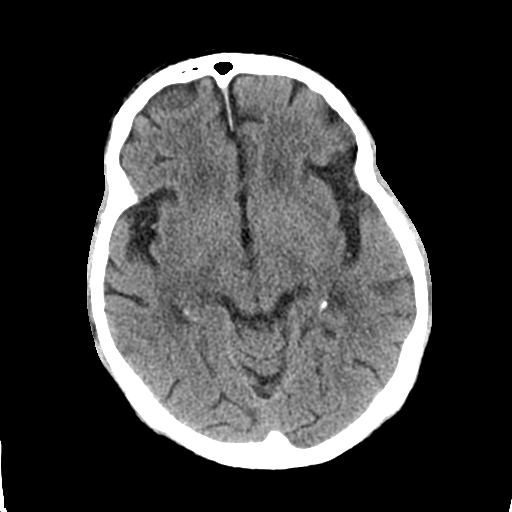
[im 17/29  brain]
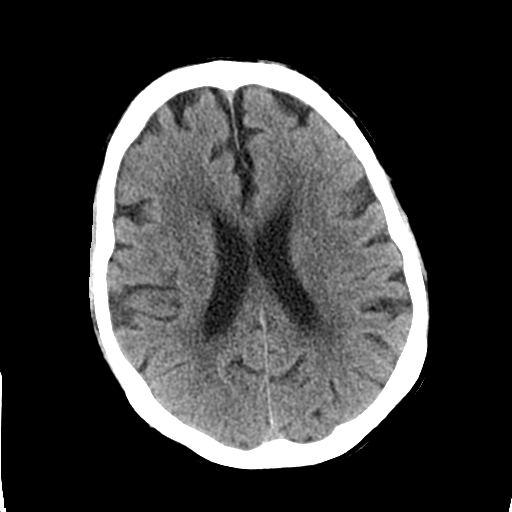
[im 23/29  brain]
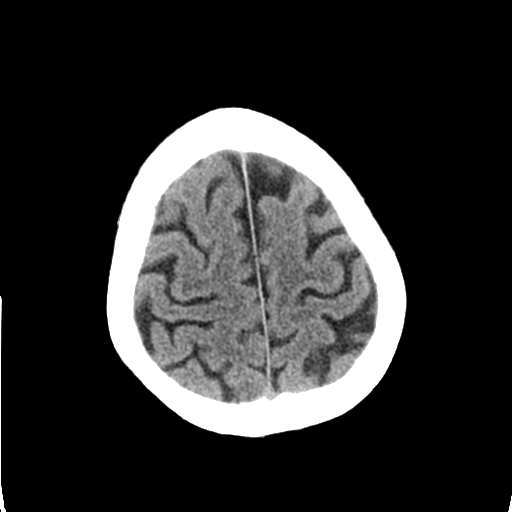

[Series 4: bone windows · axial · 0.43mm/px · z∈[-145,-37]mm · 7 of 48 slices shown]
[im 6/48  bone]
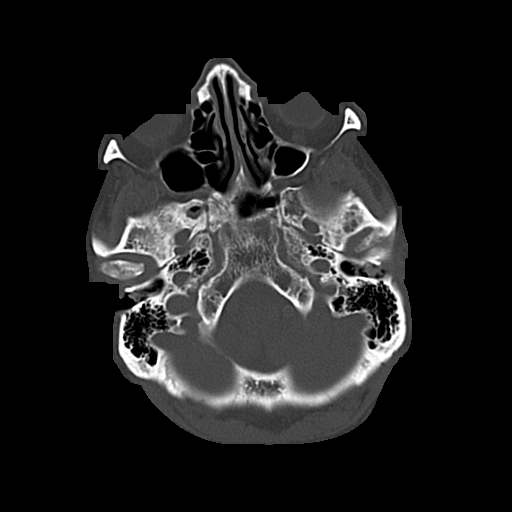
[im 12/48  bone]
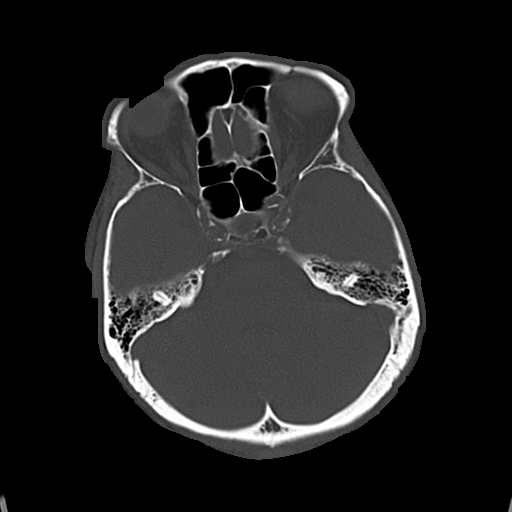
[im 18/48  bone]
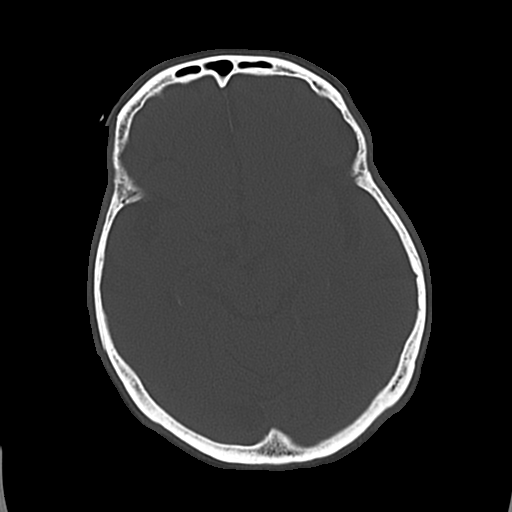
[im 24/48  bone]
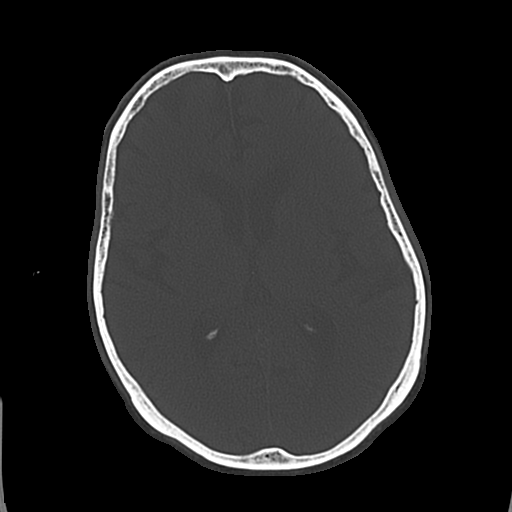
[im 30/48  bone]
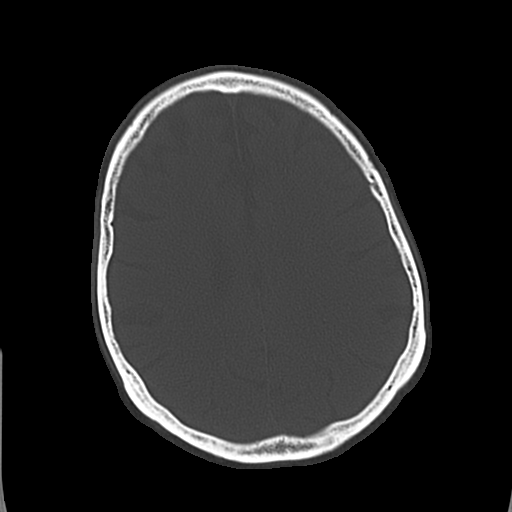
[im 36/48  bone]
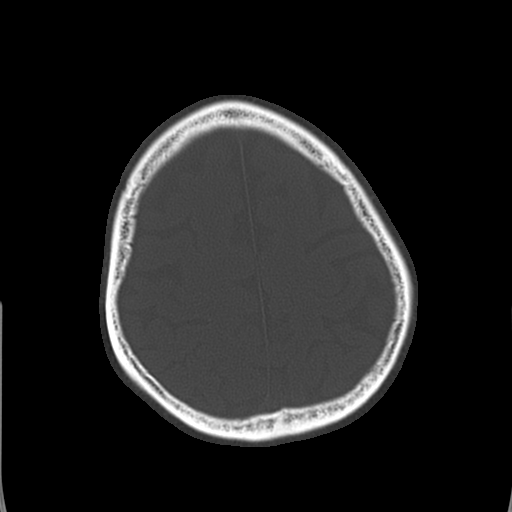
[im 42/48  bone]
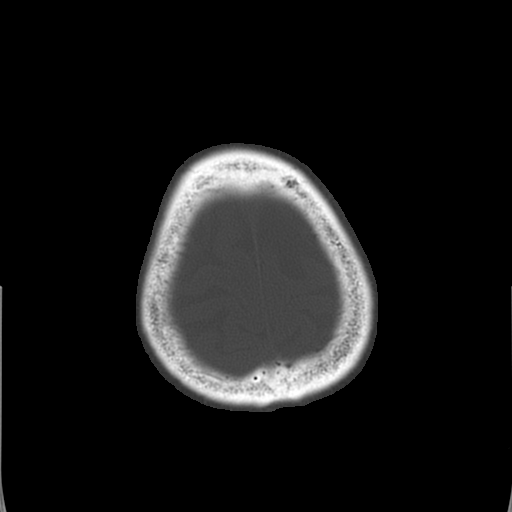

[Series 9: coronal · coronal · 0.29mm/px · 3 of 61 slices shown]
[im 23/61  brain]
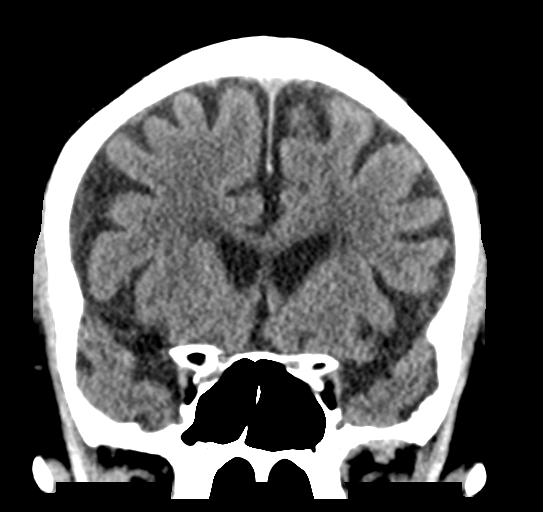
[im 31/61  brain]
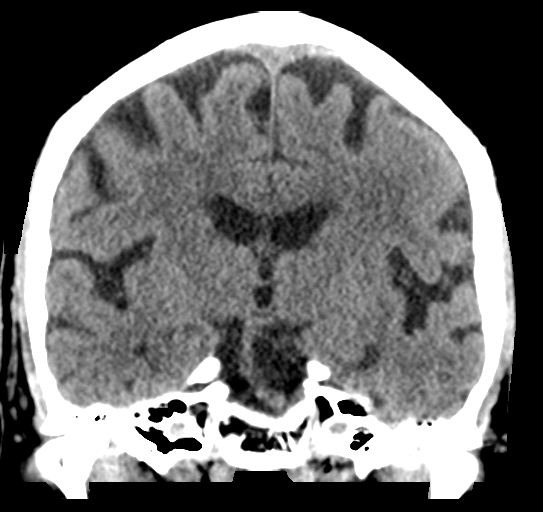
[im 38/61  brain]
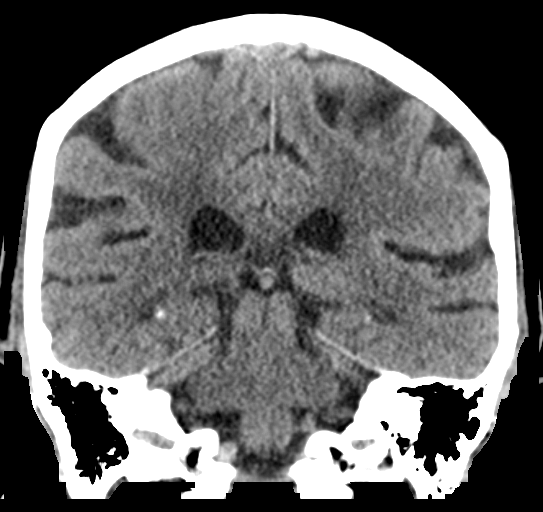

[Series 10: sagittal · sagittal · 0.30mm/px · 2 of 49 slices shown]
[im 17/49  brain]
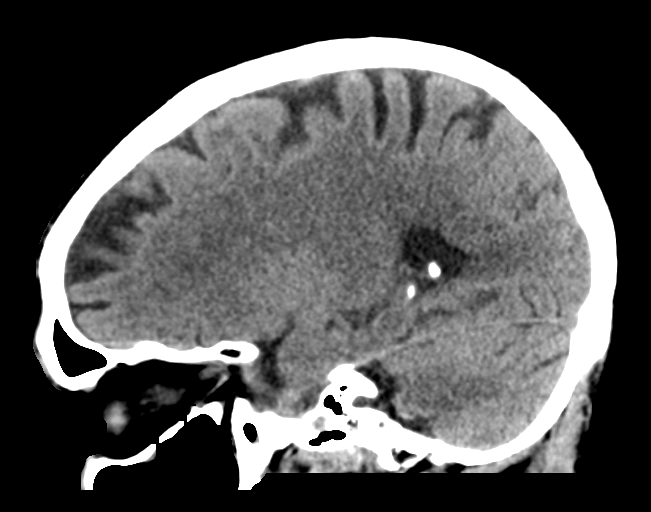
[im 33/49  brain]
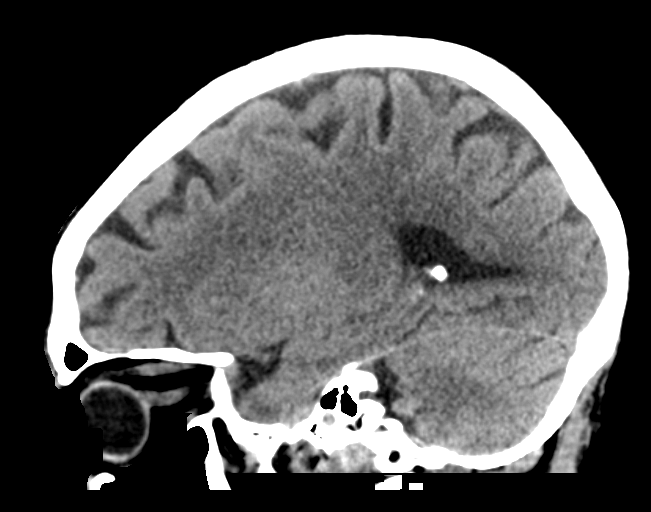

[16 of 47 positions shown; findings below may reference images not displayed]

FINDINGS: CT HEAD FINDINGS

There is no evidence of acute infarction, mass lesion, or intra- or
extra-axial hemorrhage on CT.

Prominence of the ventricles and sulci reflects mild cortical volume
loss. Mild cerebellar atrophy is noted. Scattered periventricular
and subcortical white matter change likely reflects small vessel
ischemic microangiopathy.

The brainstem and fourth ventricle are within normal limits. The
basal ganglia are unremarkable in appearance. The cerebral
hemispheres demonstrate grossly normal gray-white differentiation.
No mass effect or midline shift is seen.

There is no evidence of fracture; visualized osseous structures are
unremarkable in appearance. The visualized portions of the orbits
are within normal limits. There is mild partial opacification of the
left maxillary sinus. The remaining paranasal sinuses and mastoid
air cells are well-aerated. No significant soft tissue abnormalities
are seen.

CT CERVICAL SPINE FINDINGS

There is no evidence of fracture or subluxation. Vertebral bodies
demonstrate normal height and alignment. Multilevel disc space
narrowing and endplate sclerotic change are noted along the cervical
spine, with anterior and posterior disc osteophyte complexes.
Prevertebral soft tissues are within normal limits.

The thyroid gland is unremarkable in appearance. The visualized lung
apices are clear. Minimal calcification is seen at the carotid
bifurcations bilaterally.
IMPRESSION: 1. No evidence of traumatic intracranial injury or fracture.
2. No evidence of fracture or subluxation along the cervical spine.
3. Mild cortical volume loss and scattered small vessel ischemic
microangiopathy.
4. Mild degenerative change along the cervical spine.
5. Mild partial opacification of the left maxillary sinus.

## 2017-08-22 IMAGING — CR DG HIP (WITH OR WITHOUT PELVIS) 2-3V*L*
3 series · 3 of 3 positions shown · non-contrast
Comparison: None.

CLINICAL DATA: Status post unwitnessed fall, with left hip pain and
deformity. Initial encounter.

EXAM:
DG HIP (WITH OR WITHOUT PELVIS) 2-3V LEFT

[x hip lat left]
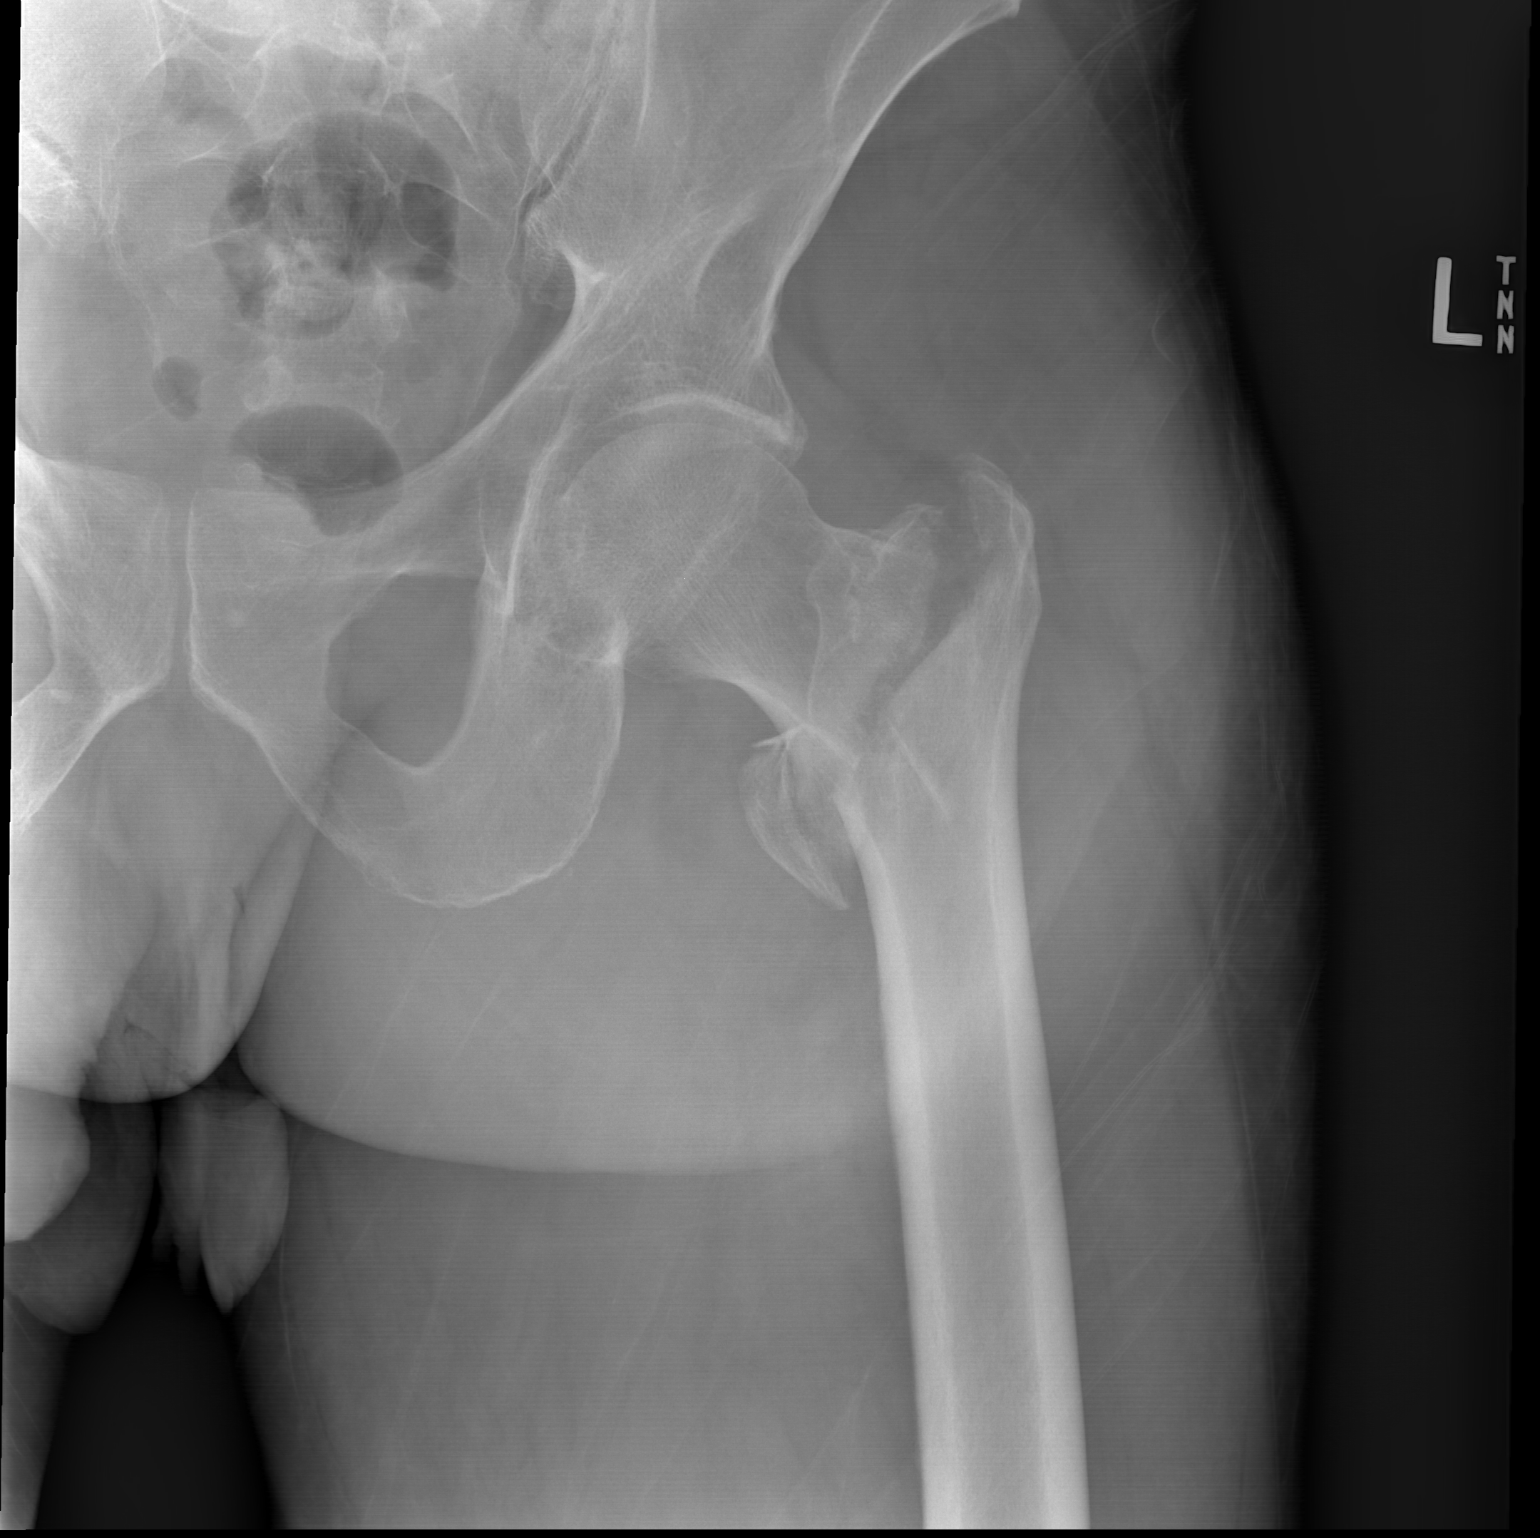

[x pelvis]
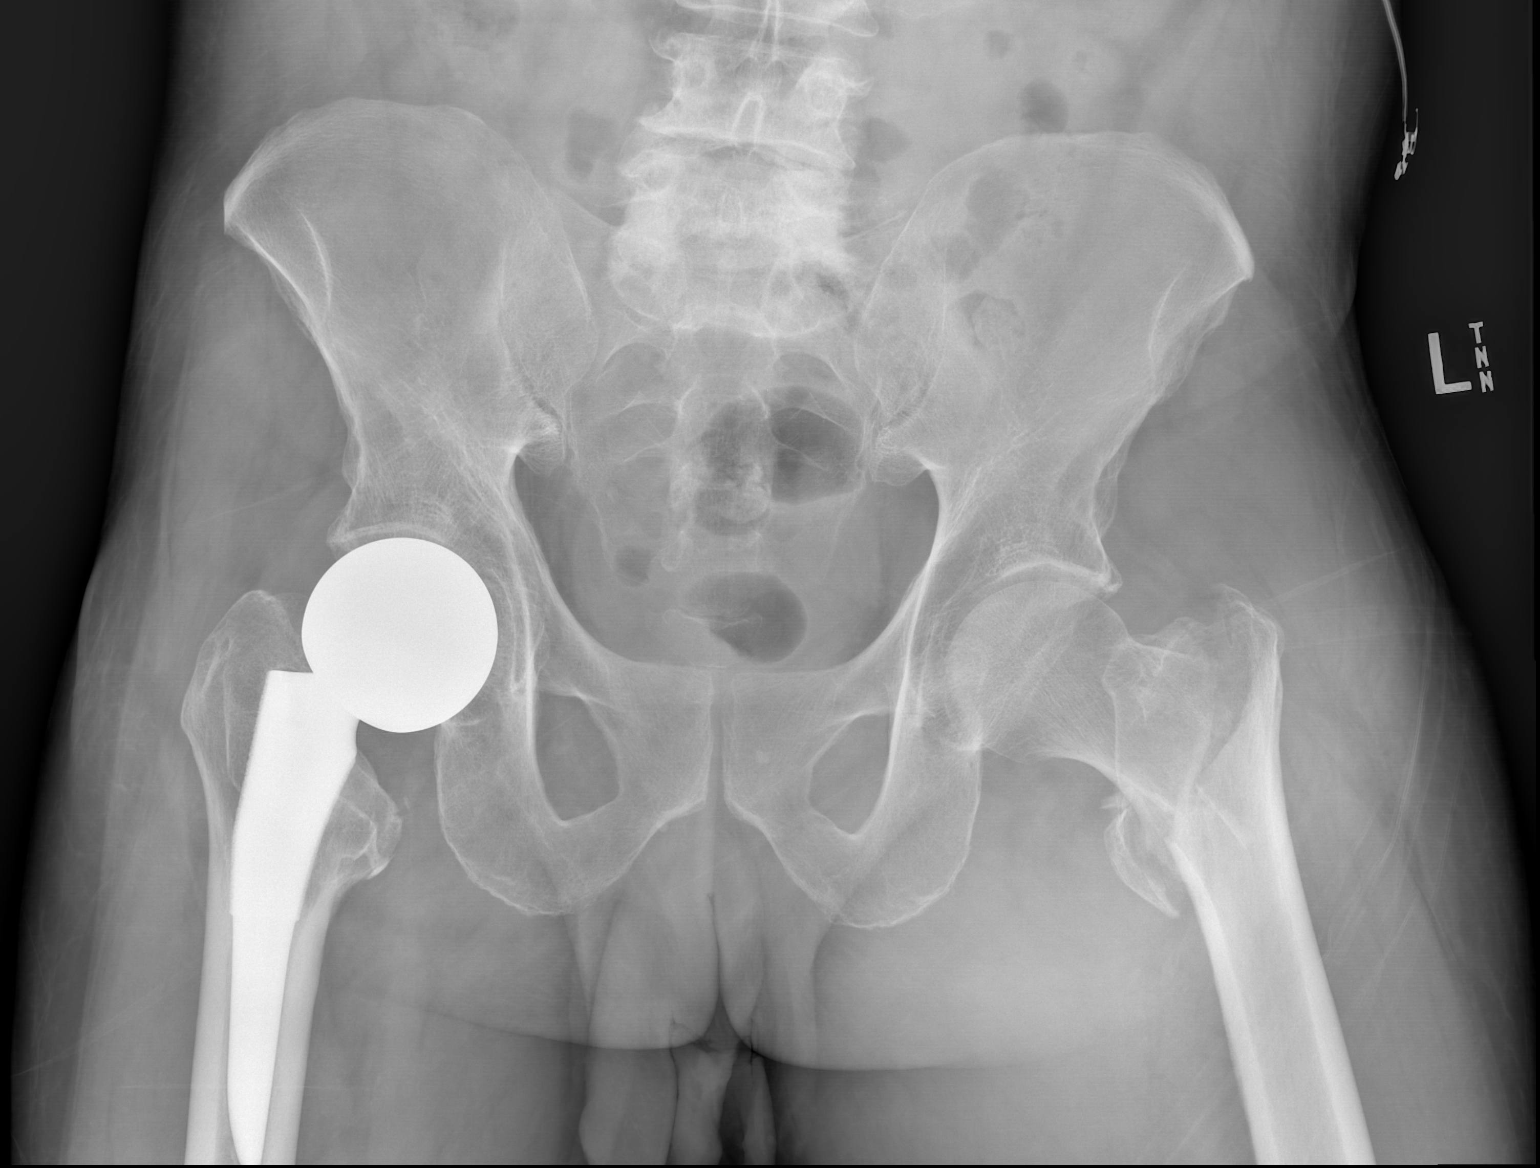

[w hip lat left]
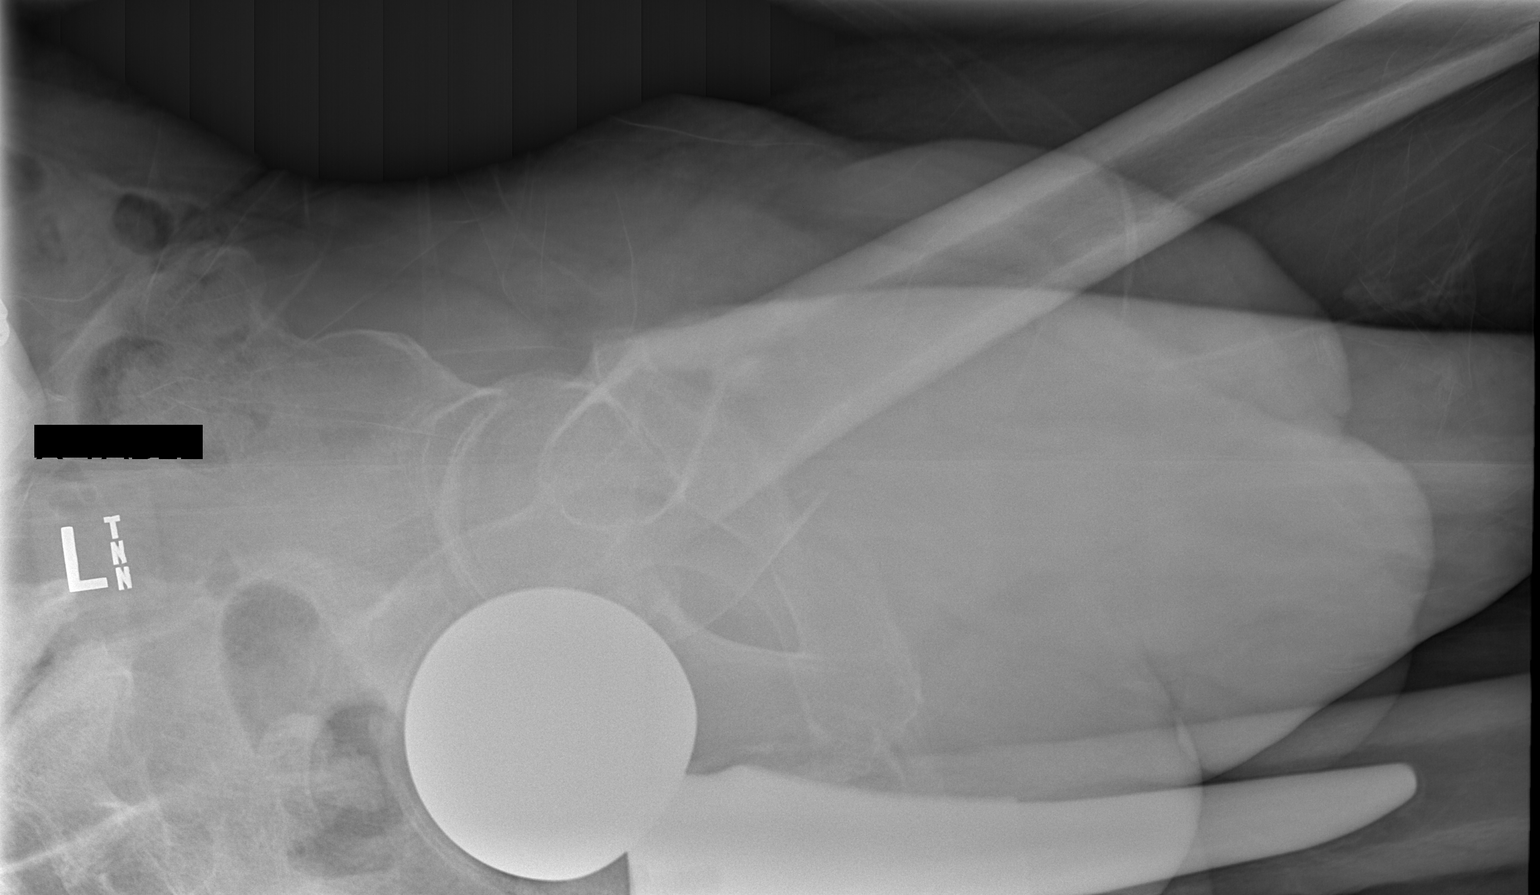

[3 of 3 positions shown; findings below may reference images not displayed]

FINDINGS: There is a comminuted left femoral intertrochanteric fracture, with
mild displacement. The left femoral head remains seated at the
acetabulum. The right hip arthroplasty is grossly unremarkable in
appearance. No additional fractures are seen.

The sacroiliac joints are within normal limits. Mild degenerative
change is noted at the lower lumbar spine. Soft tissue swelling is
noted about the left hip.
IMPRESSION: Comminuted left femoral intertrochanteric fracture, with mild
displacement.

## 2017-08-22 IMAGING — CR DG CHEST 1V
1 series · 1 of 1 positions shown · non-contrast
Comparison: Chest radiograph performed 05/15/2016

CLINICAL DATA: Acute onset of left hip pain and deformity after
fall. Initial encounter.

EXAM:
CHEST 1 VIEW

[x chest ap]
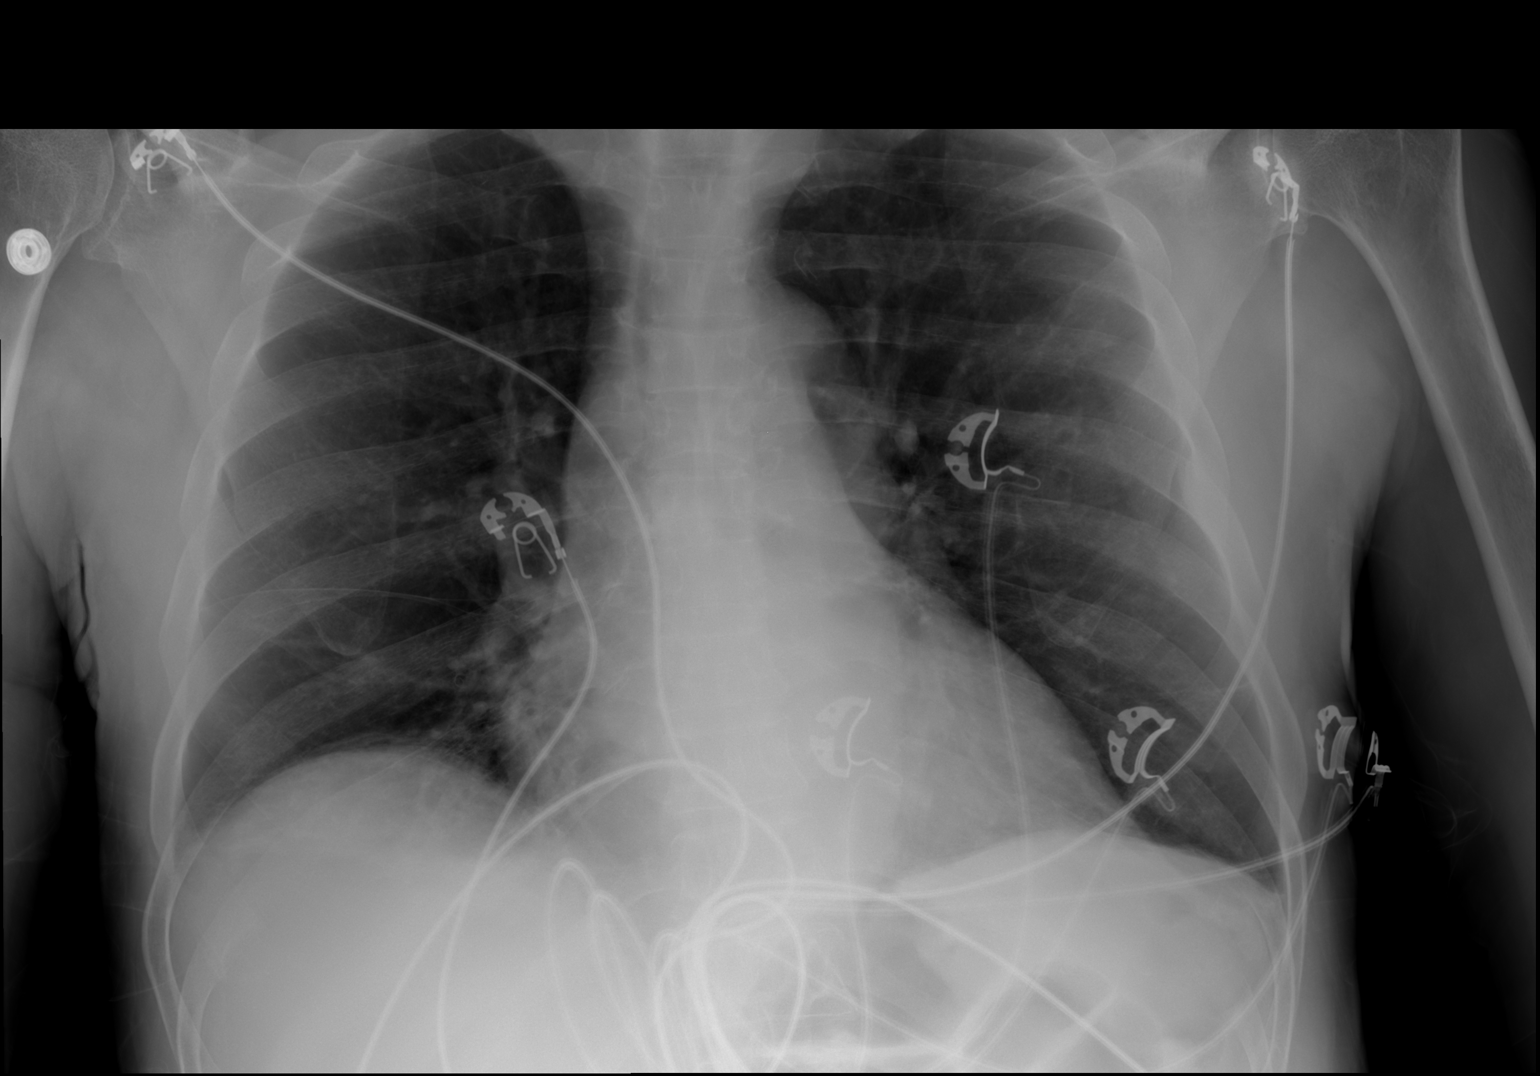

[1 of 1 positions shown; findings below may reference images not displayed]

FINDINGS: The lungs are well-aerated and clear. There is no evidence of focal
opacification, pleural effusion or pneumothorax.

The cardiomediastinal silhouette is within normal limits. No acute
osseous abnormalities are seen.
IMPRESSION: No acute cardiopulmonary process seen. No displaced rib fractures
identified.

## 2017-08-23 IMAGING — CR DG PORTABLE PELVIS
1 series · 1 of 1 positions shown · non-contrast
Comparison: 06/15/2016

CLINICAL DATA: 70-year-old male with a history of left hip fracture
repair

EXAM:
PORTABLE PELVIS 1-2 VIEWS

[AP]
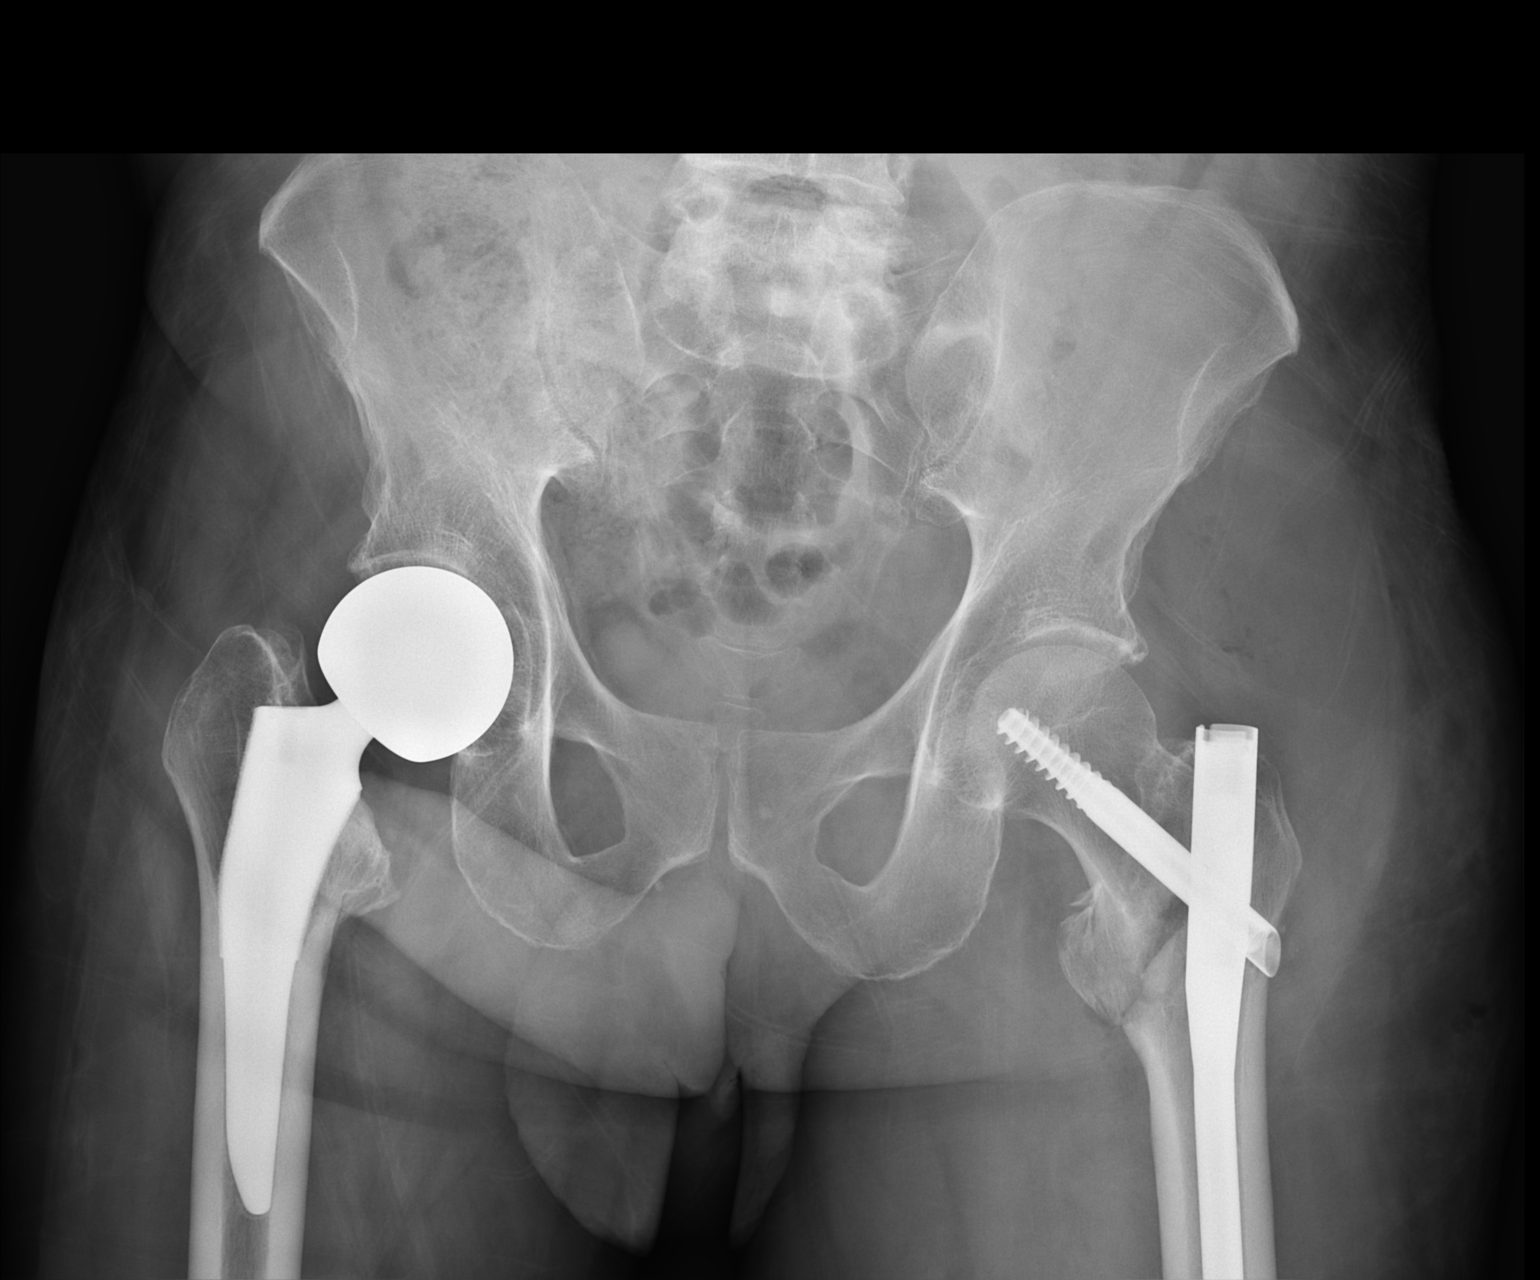

[1 of 1 positions shown; findings below may reference images not displayed]

FINDINGS: Bony pelvic ring intact.

Surgical changes of right hip arthroplasty, unchanged.

Interval ORIF of left hip fracture with anatomic alignment
reestablish. No complicating features.
IMPRESSION: Early postoperative changes of left hip intratrochanteric fracture
ORIF with anatomic alignment reestablished. No complicating
features.

Surgical changes of right hip arthroplasty.

## 2017-08-23 IMAGING — RF DG FEMUR 2+V*L*
1 series · 4 of 4 positions shown · non-contrast
Comparison: Left hip radiographs dated 06/15/2016

CLINICAL DATA: ORIF left hip

EXAM:
DG C-ARM 61-120 MIN; LEFT FEMUR 2 VIEWS
FLUOROSCOPY TIME:  1 minutes 55 seconds

[Series 1: run · 4 of 4 slices shown]
[im 1/4]
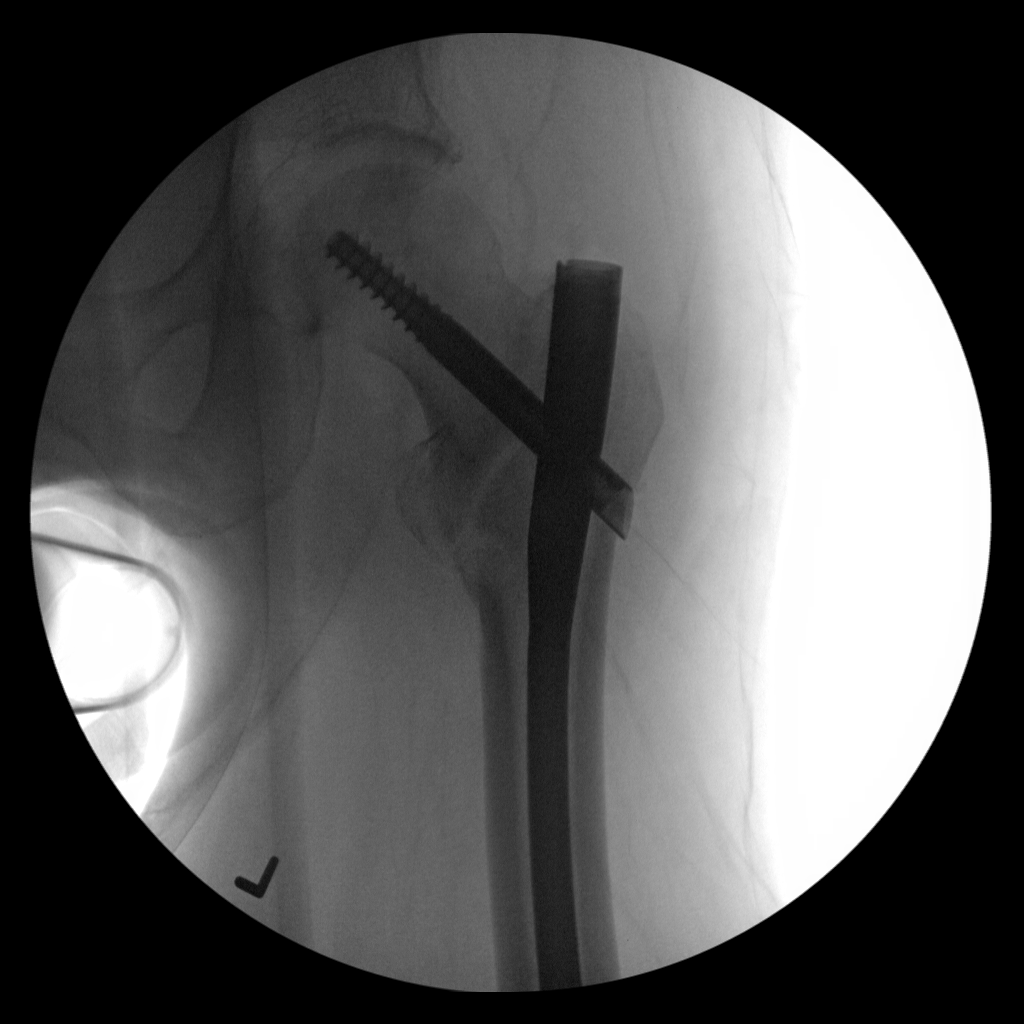
[im 2/4]
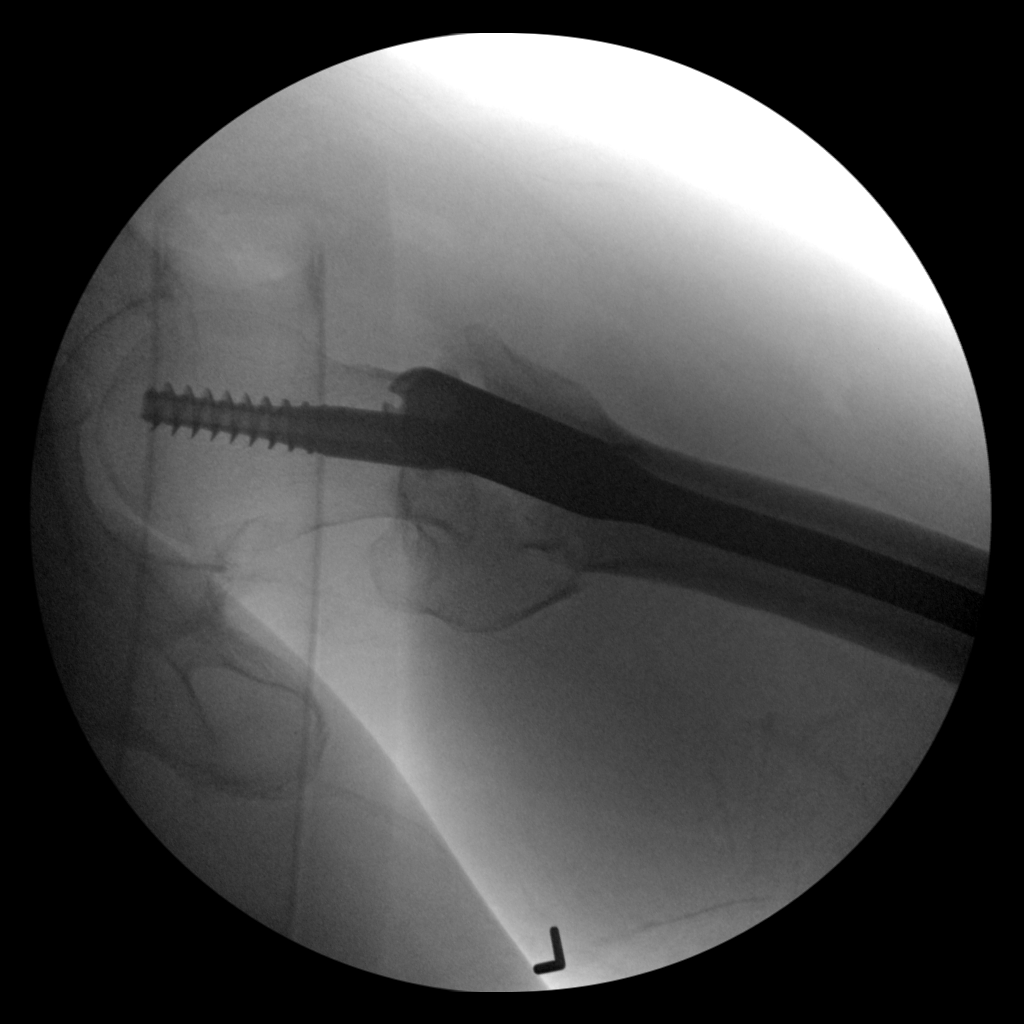
[im 3/4]
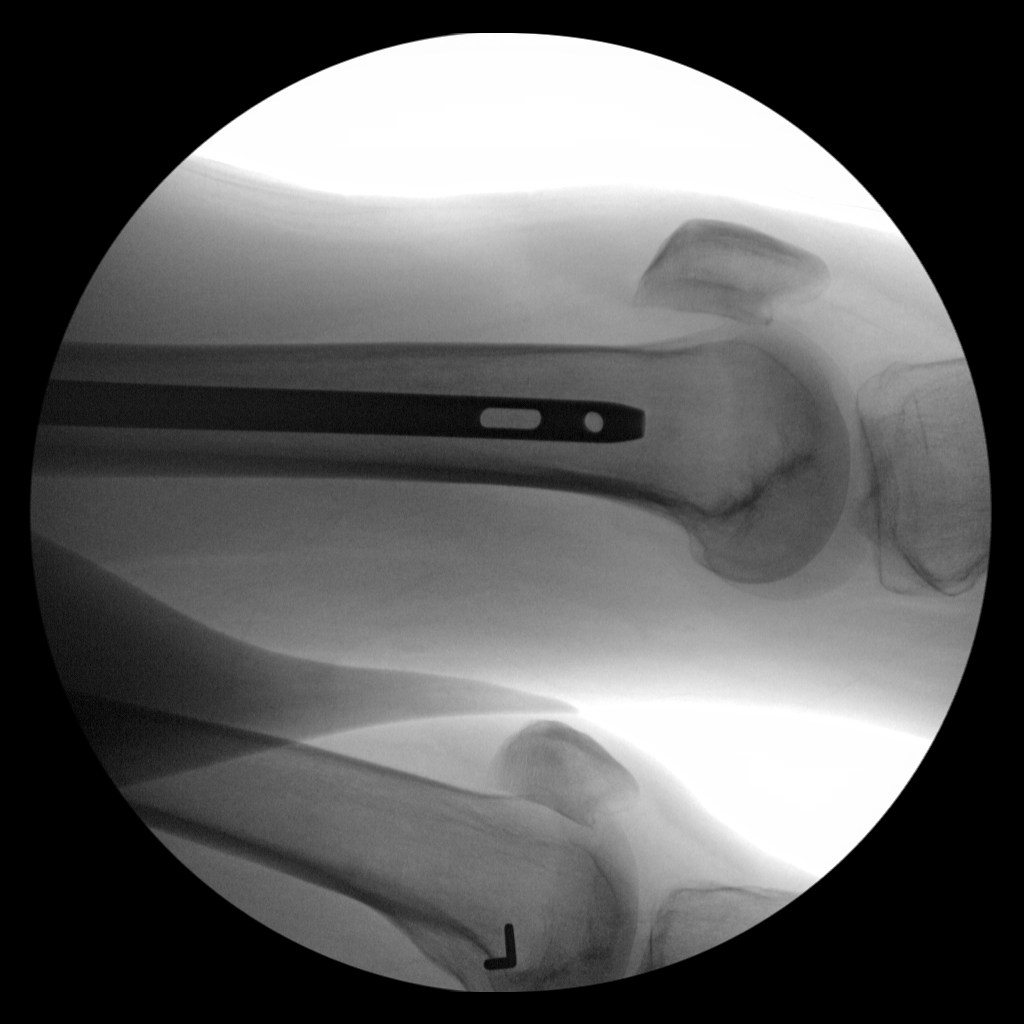
[im 4/4]
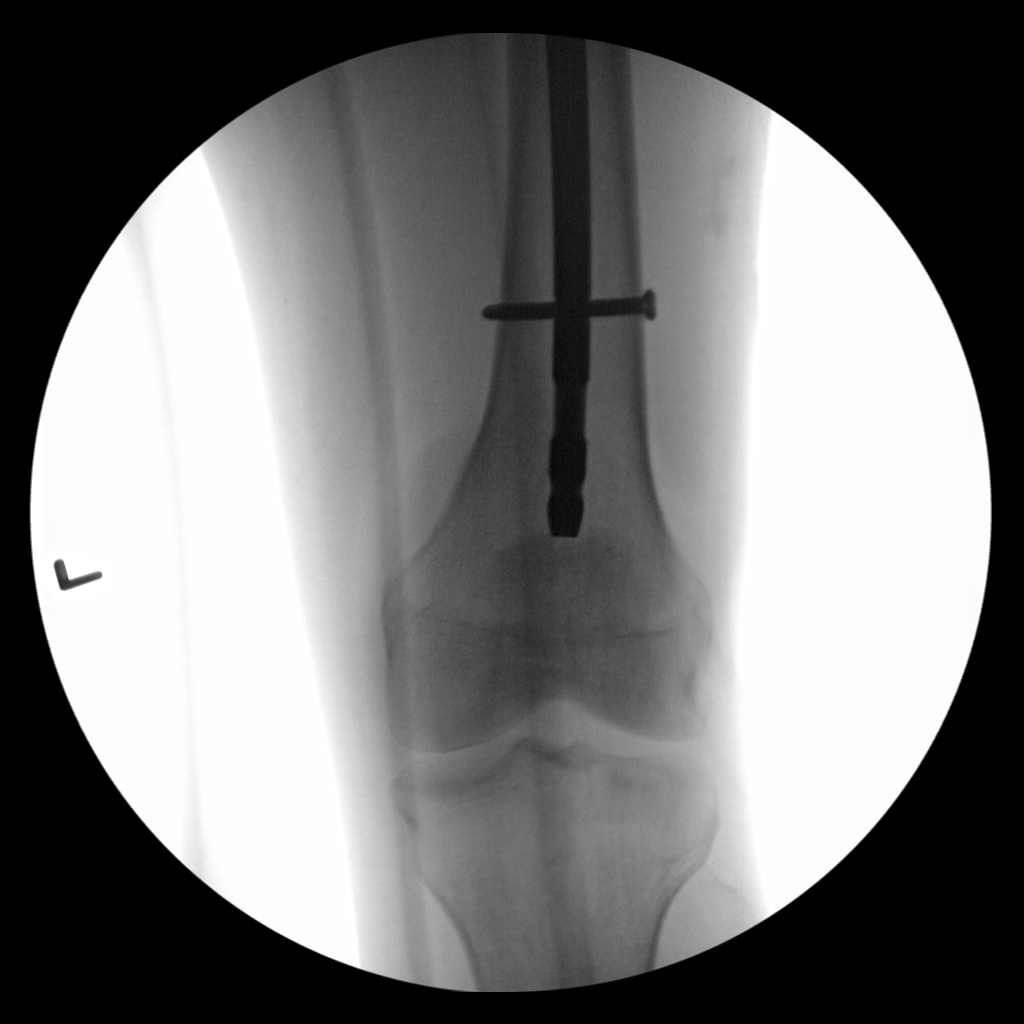

[4 of 4 positions shown; findings below may reference images not displayed]

FINDINGS: Intraoperative fluoroscopic radiographs during IM nail with dynamic
hip screw fixation of an intertrochanteric left hip fracture.

Fracture fragments are in near anatomic alignment and position.

Single distal interlocking screw.
IMPRESSION: Intraoperative fluoroscopic radiographs during ORIF of an
intertrochanteric left hip fracture, as above.

## 2017-08-23 IMAGING — CR DG FEMUR 2+V PORT*L*
4 series · 4 of 4 positions shown · non-contrast
Comparison: 06/15/2016

CLINICAL DATA: 70-year-old male with a history of postoperative
stricture or pair left hip.

EXAM:
LEFT FEMUR PORTABLE 2 VIEWS

[AP (1 of 2)]
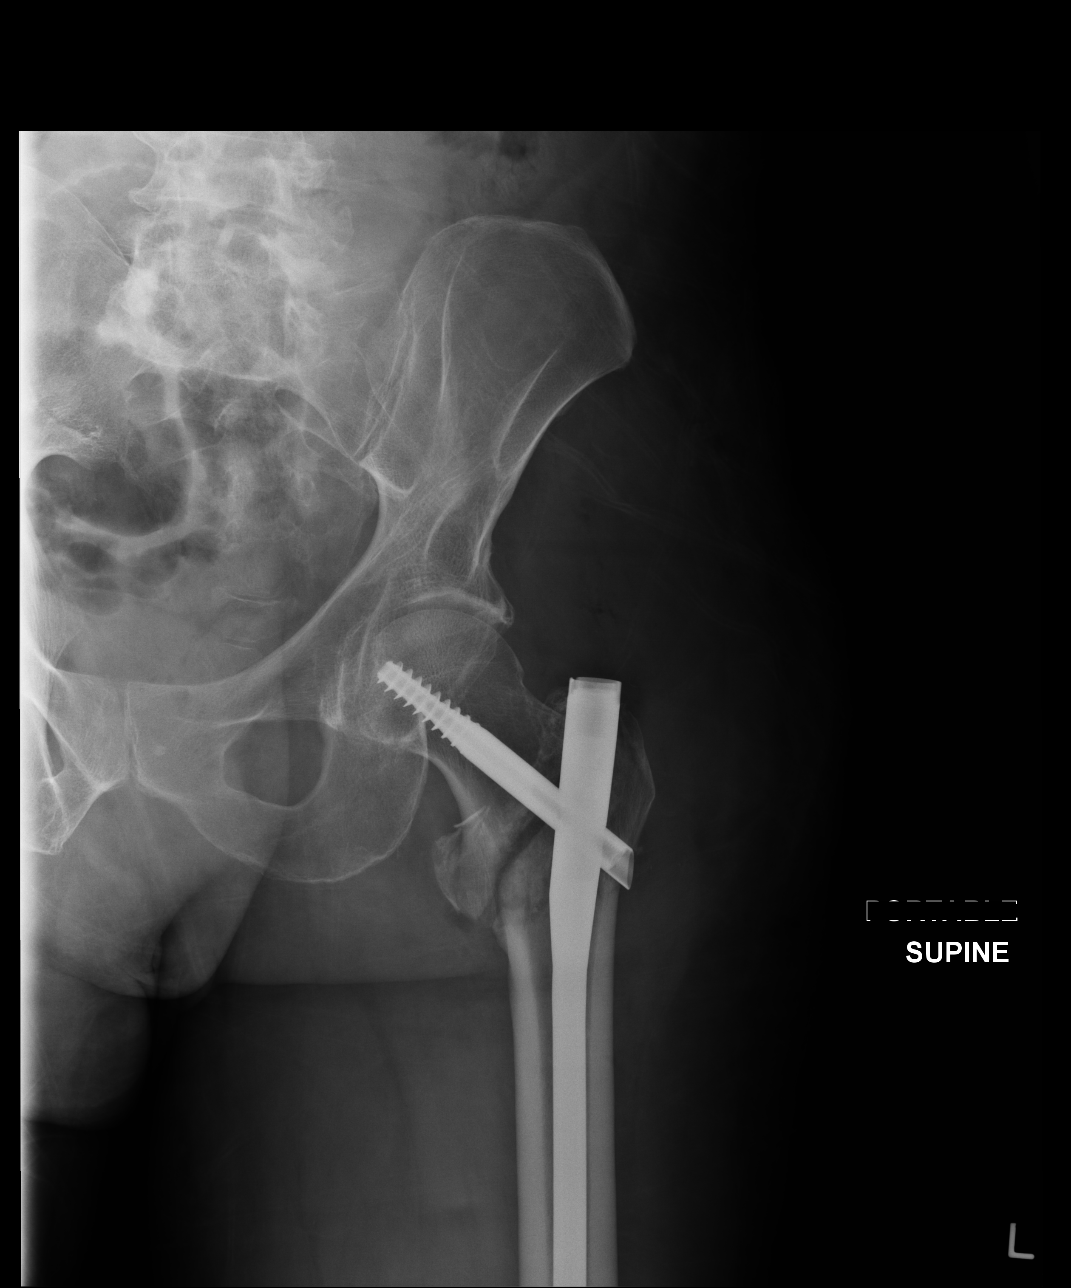

[AP (2 of 2)]
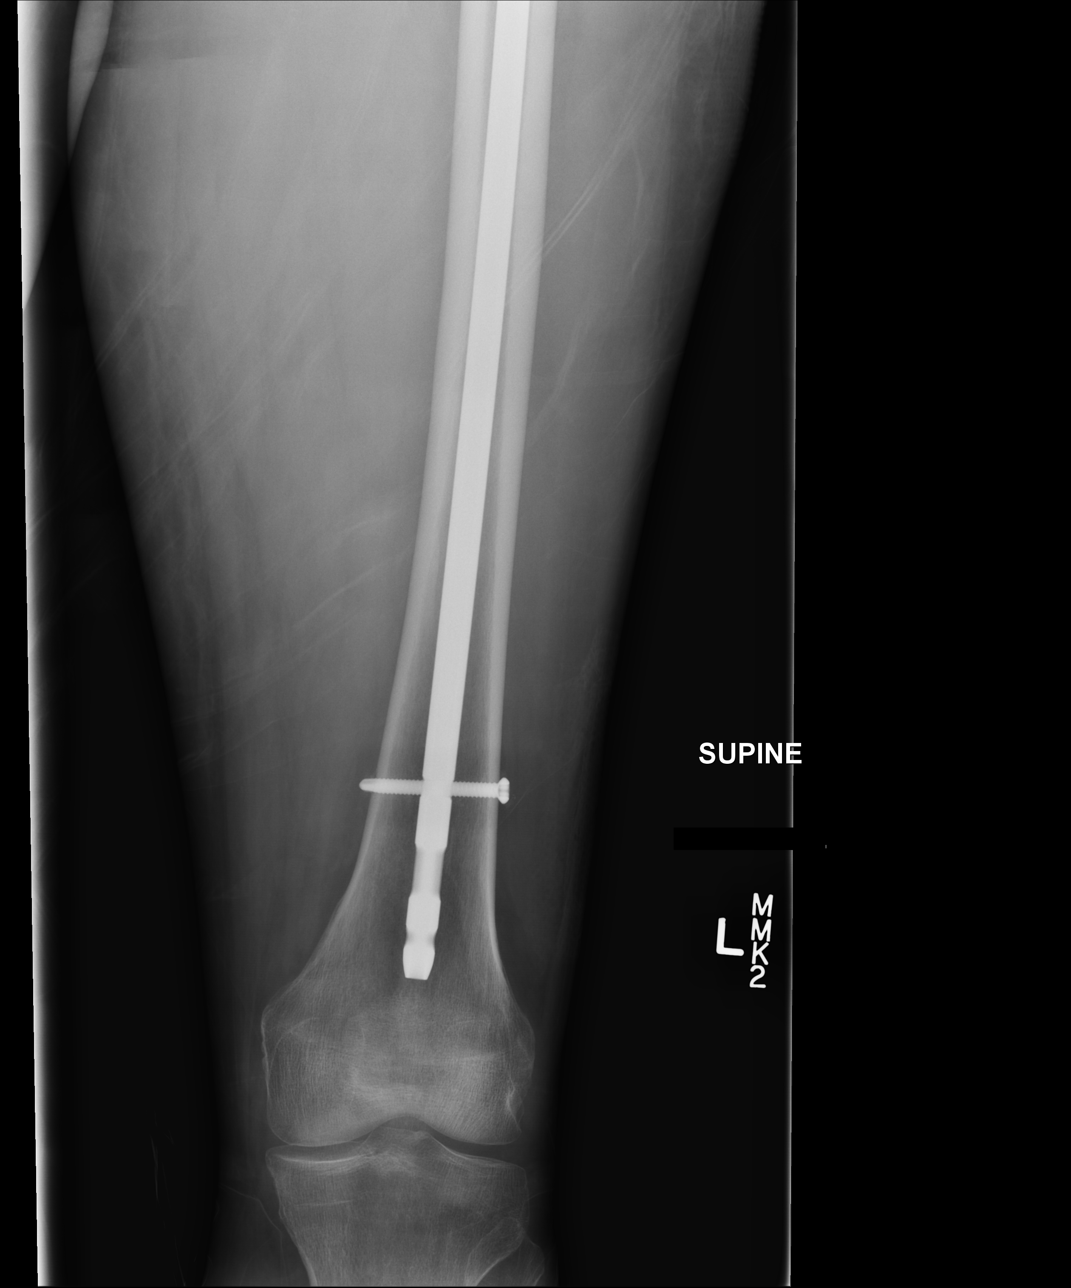

[lateral]
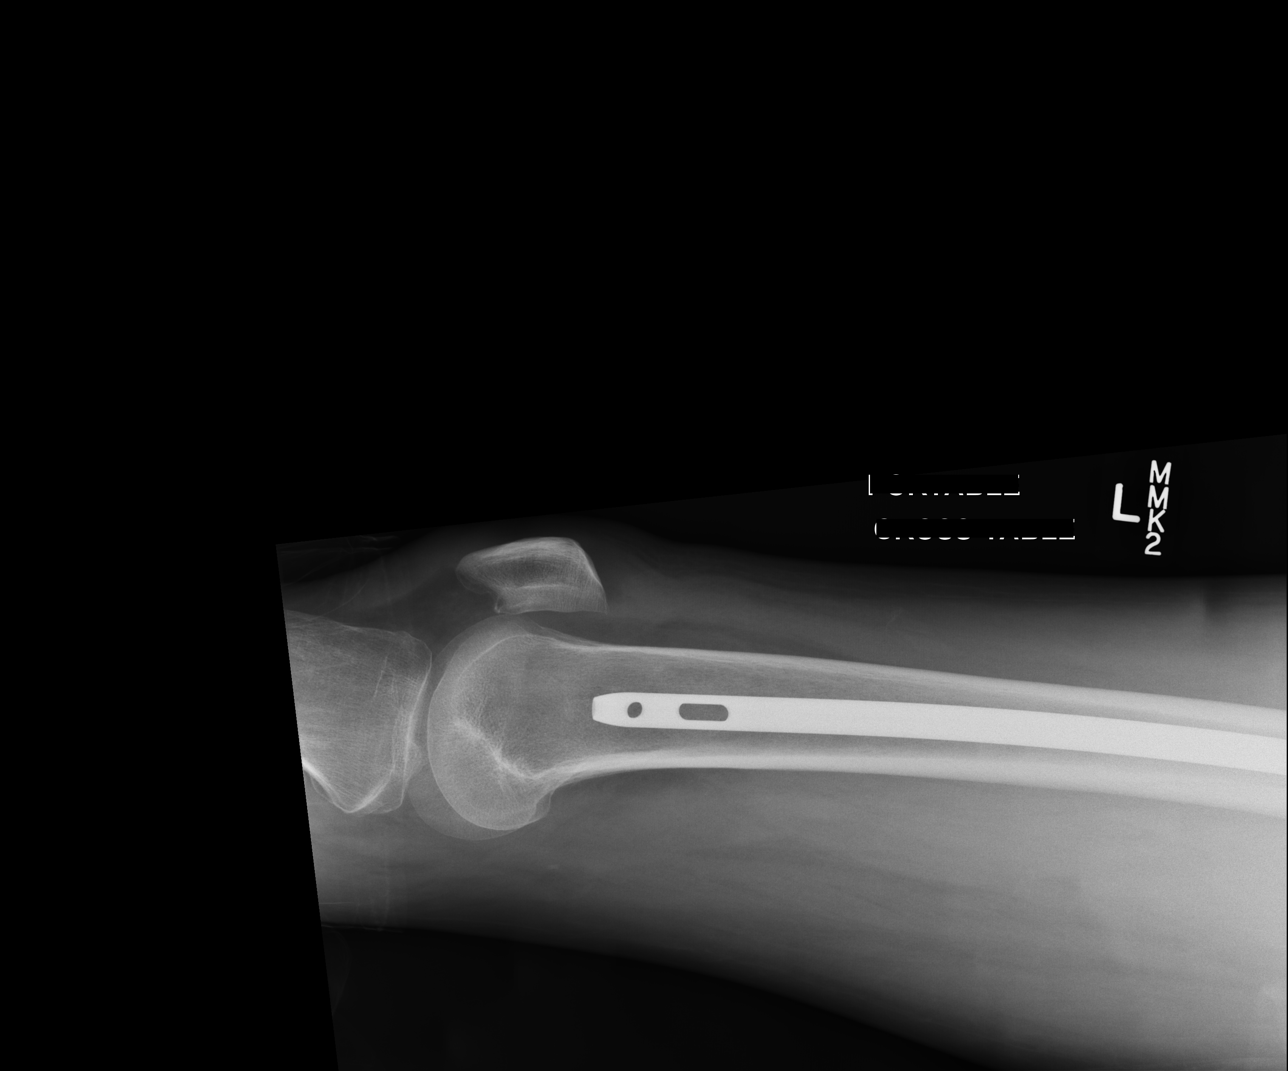

[xtable lateral]
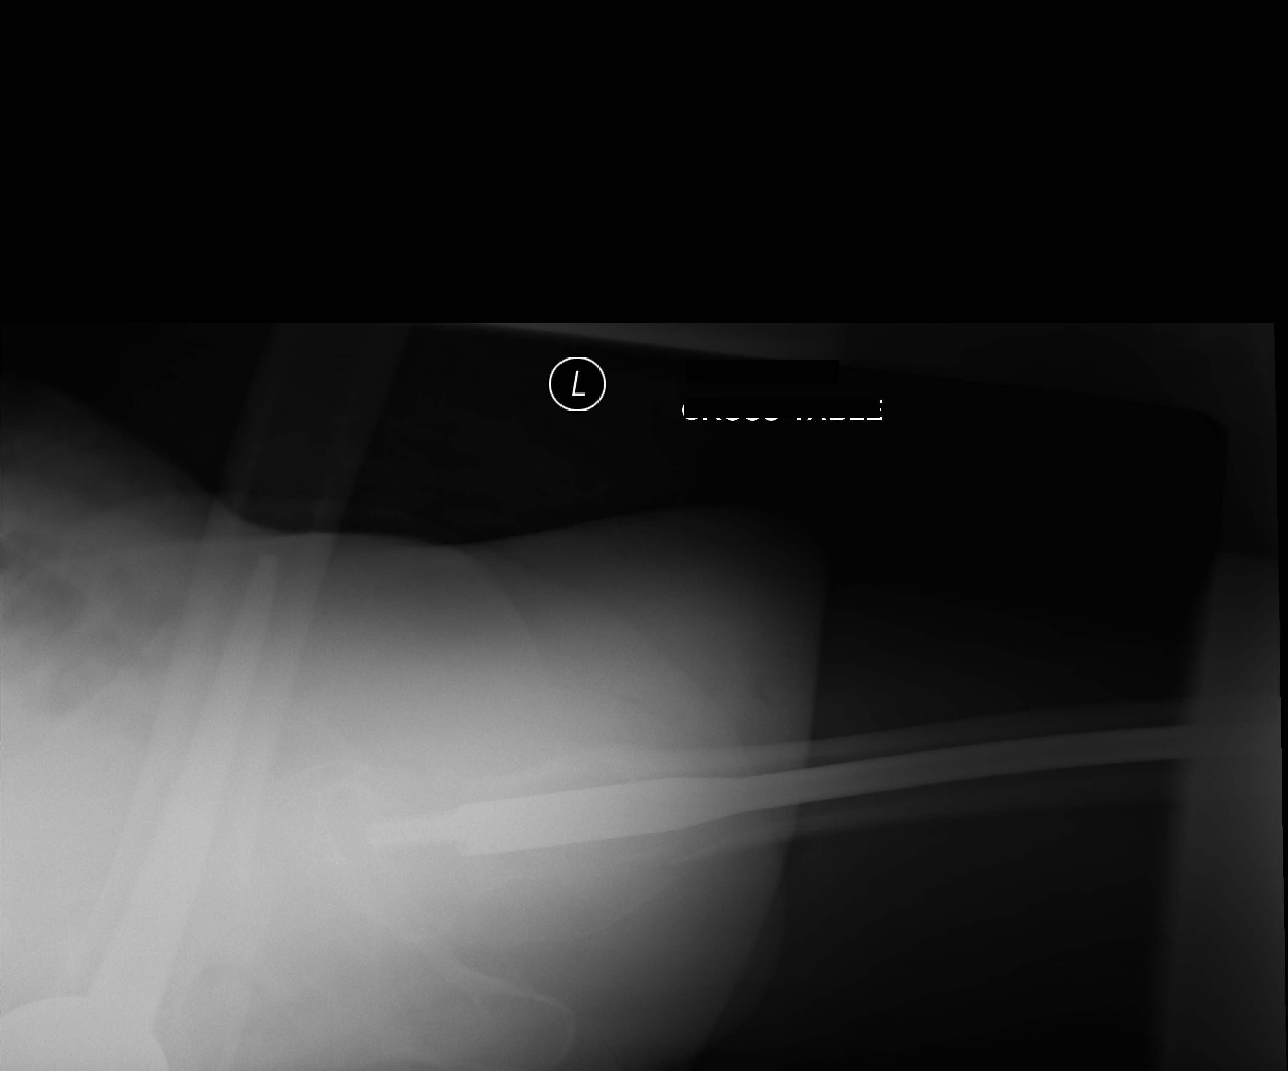

[4 of 4 positions shown; findings below may reference images not displayed]

FINDINGS: Early postoperative changes of left inter trochanteric hip fracture
repair with antegrade left femoral intra medullary rod and gamma
nail fixation at the fracture site alignment is now anatomic. No
complicating features.

Single distal interlocking screw.
IMPRESSION: Early postoperative changes of left intratrochanteric hip fracture
ORIF with antegrade intra medullary rod and gamma nail fixation of
the femoral head, with single distal interlocking screw. No
complicating features.

## 2017-08-24 IMAGING — CT CT ABD-PELV W/O CM
2 of 4 series · 15 of 46 positions shown, 17 images · non-contrast
Comparison: Pelvic radiograph June 16, 2016

CLINICAL DATA: Fell yesterday resulting in LEFT femur fracture.
Drop in hemoglobin. Assess for retroperitoneal hemorrhage. History
of hypertension and diabetes.

EXAM:
CT ABDOMEN AND PELVIS WITHOUT CONTRAST
TECHNIQUE: Multidetector CT imaging of the abdomen and pelvis was performed
following the standard protocol without IV contrast.

[Series 2: a/p w/o 5mm · axial · non-contrast · 0.67mm/px · z∈[+1066,+1451]mm · 12 of 88 slices shown, 14 images]
[im 7/88  soft-tissue]
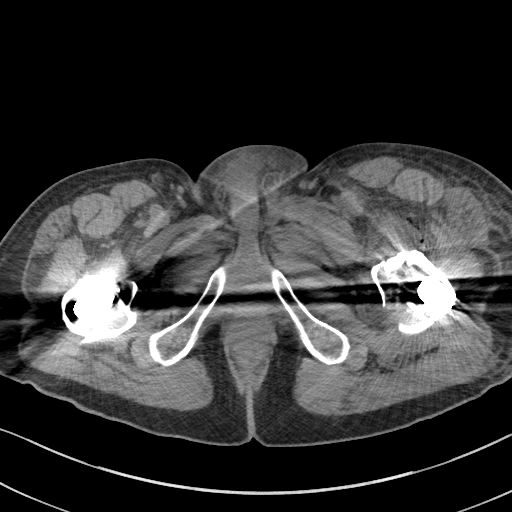
[im 7/88  bone]
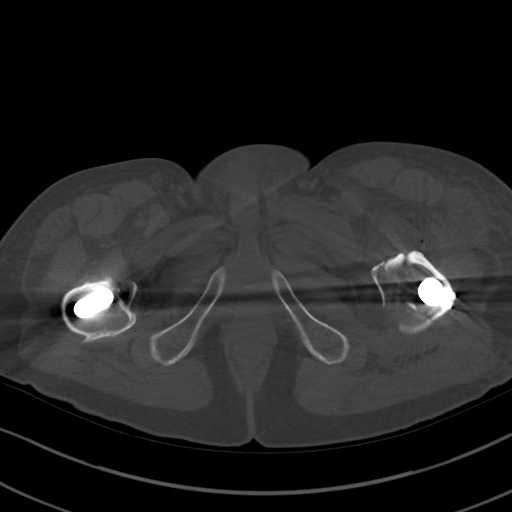
[im 14/88  soft-tissue]
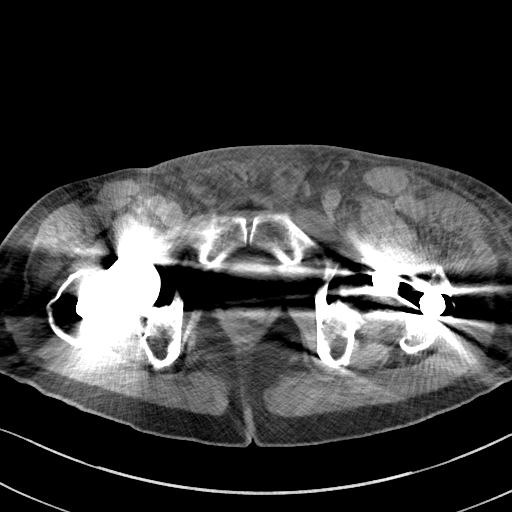
[im 21/88  soft-tissue]
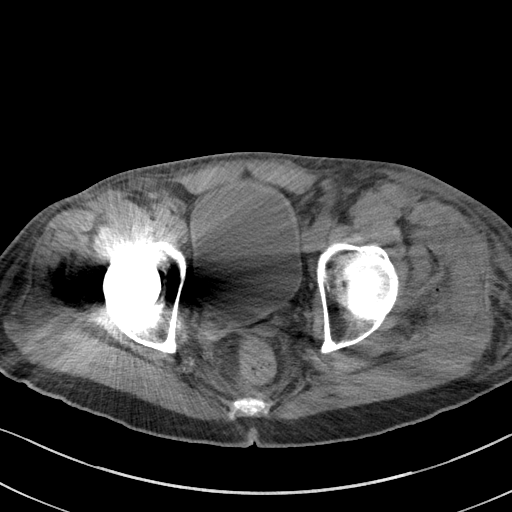
[im 28/88  soft-tissue]
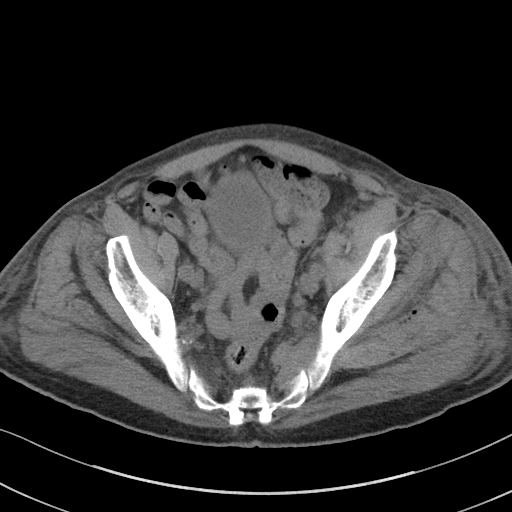
[im 35/88  soft-tissue]
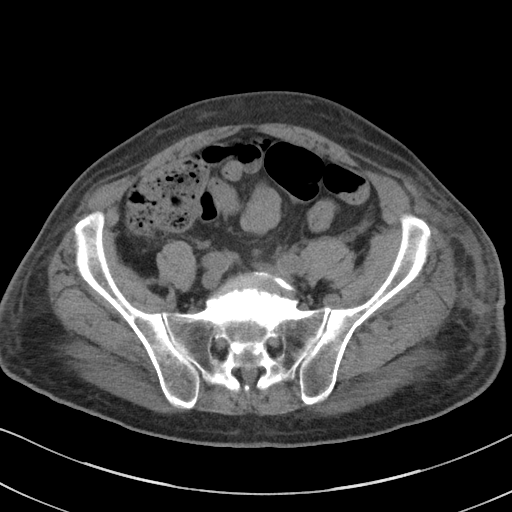
[im 42/88  soft-tissue]
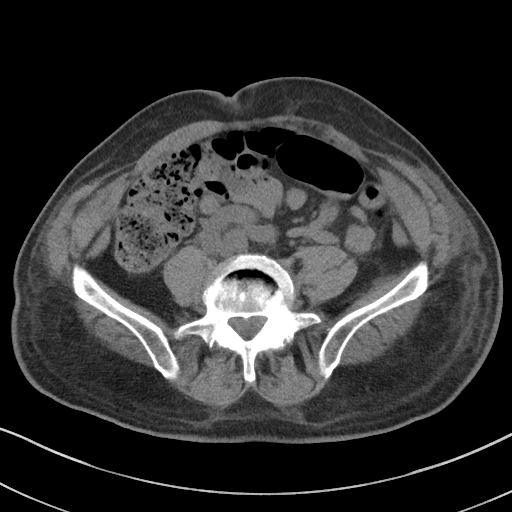
[im 49/88  soft-tissue]
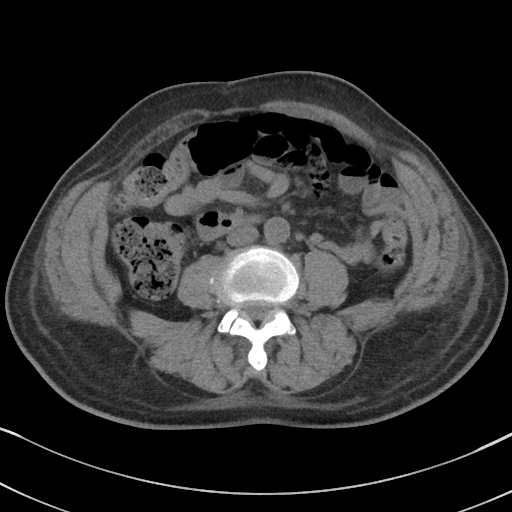
[im 56/88  soft-tissue]
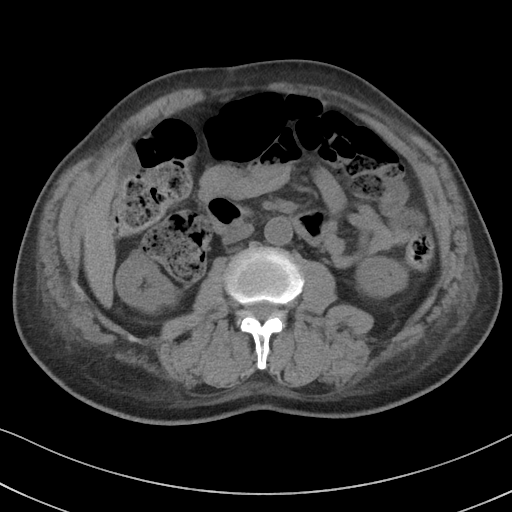
[im 63/88  soft-tissue]
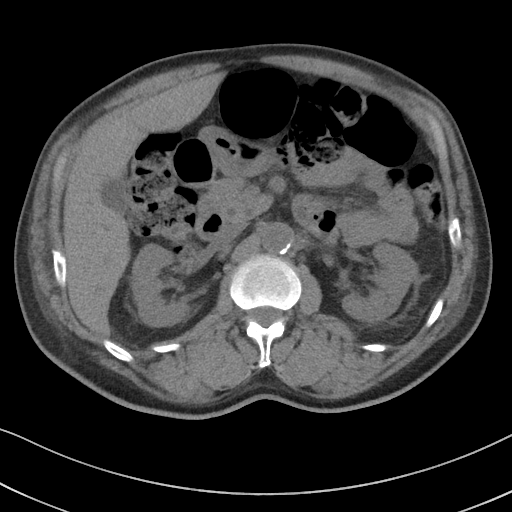
[im 63/88  bone]
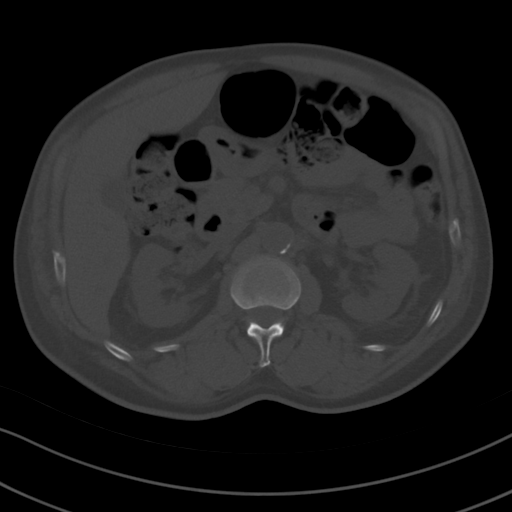
[im 70/88  soft-tissue]
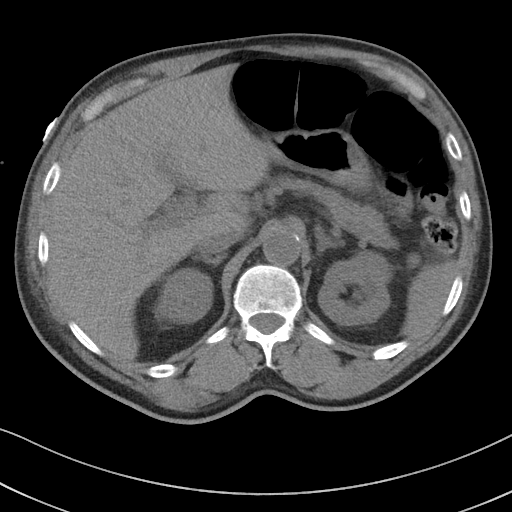
[im 77/88  soft-tissue]
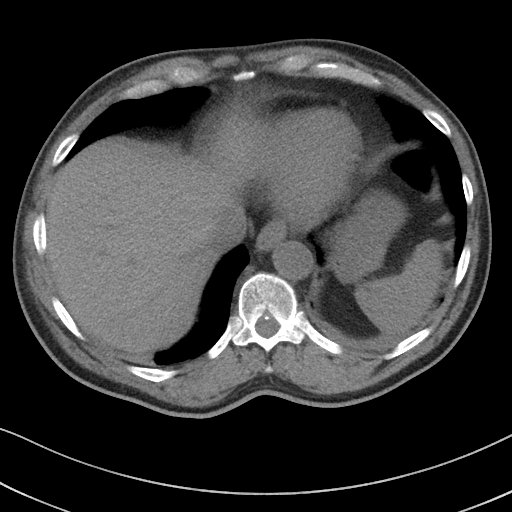
[im 84/88  soft-tissue]
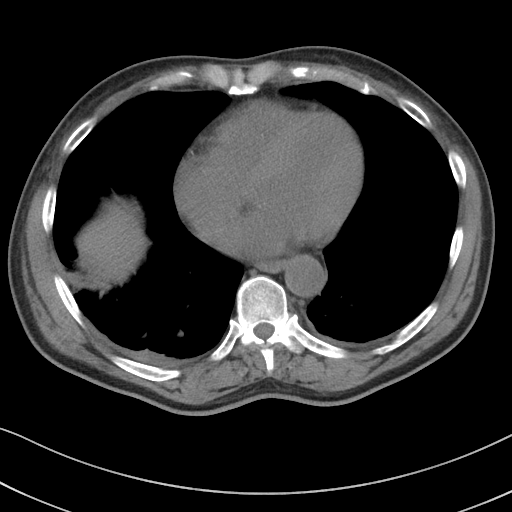

[Series 5: a/p w/o cor · coronal · non-contrast · 0.71mm/px · 3 of 142 slices shown]
[im 48/142  soft-tissue]
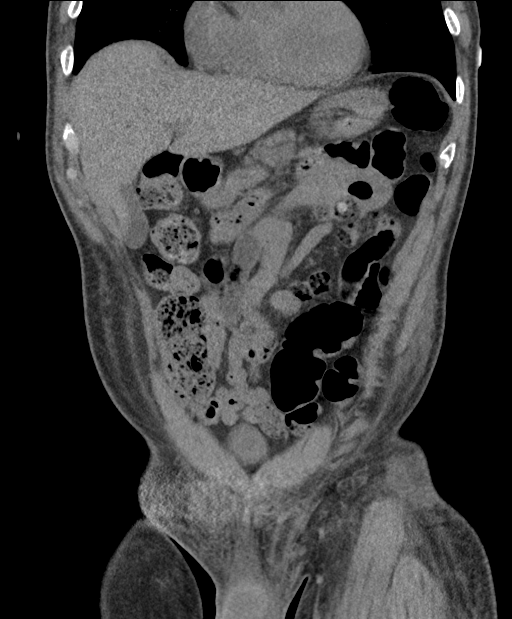
[im 63/142  soft-tissue]
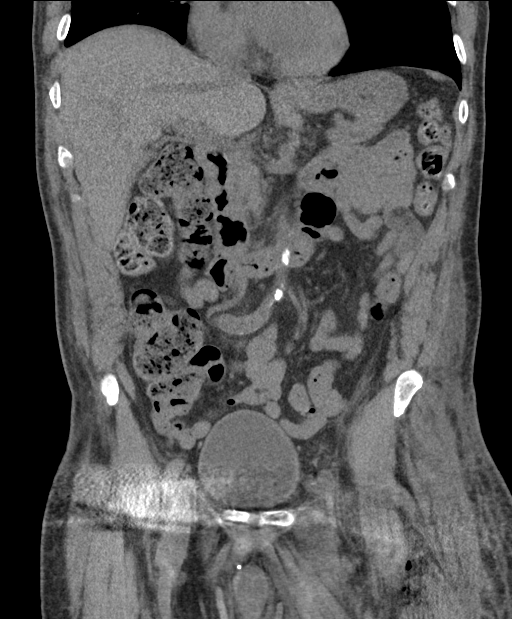
[im 79/142  soft-tissue]
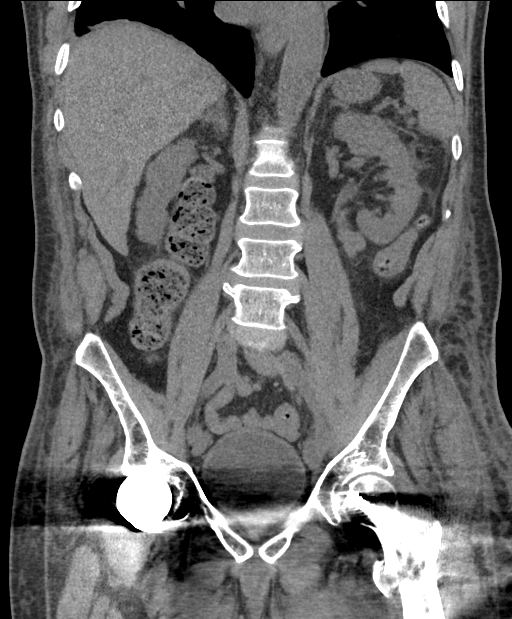

[15 of 46 positions shown; findings below may reference images not displayed]

FINDINGS: LUNG BASES: Bibasilar atelectasis. Heart size is mildly enlarged.
Partially imaged coronary artery calcifications. No pericardial
effusion.

KIDNEYS/BLADDER: Kidneys are orthotopic, demonstrating normal size
and morphology. No nephrolithiasis, hydronephrosis; limited
assessment for renal masses on this nonenhanced examination. 11 mm
LEFT lower pole cyst. 14 mm LEFT interpolar cyst. The unopacified
ureters are normal in course and caliber. Urinary bladder is well
distended and unremarkable.

SOLID ORGANS: The liver, gallbladder, pancreas are unremarkable for
this non-contrast examination. Trace splenic calcifications.
Thickened adrenal glands without dominant nodule most compatible
with hyperplasia.

GASTROINTESTINAL TRACT: The stomach, small and large bowel are
normal in course and caliber without inflammatory changes, the
sensitivity may be decreased by lack of enteric contrast. Normal
appendix.

PERITONEUM/RETROPERITONEUM: Aortoiliac vessels are normal in course
and caliber, mild calcific atherosclerosis. No lymphadenopathy by CT
size criteria. Prostate obscured by streak artifact. No definite
intraperitoneal free fluid or free air. Trace retroperitoneal free
fluid/hemorrhage.

SOFT TISSUES/ OSSEOUS STRUCTURES: Mildly enlarged LEFT iliopsoas and
LEFT piriformis muscles. RIGHT piriformis muscle atrophy. Enlarged
LEFT pelvic muscles most compatible with intramuscular hematoma.
Bilateral flank subcutaneous fat stranding. Small amount of gas
about the LEFT hip consistent with recent surgery for femur
fracture. Small to moderate fat containing umbilical hernia. Severe
L4-5 and L5-S1 degenerative discs with broad-based disc osteophyte
complexes resulting in moderate canal stenosis L5-S1 and RIGHT
lateral recess effacement which may affect the traversing RIGHT S1
nerve. Severe L4-5 and L5-S1 neural foraminal narrowing.
IMPRESSION: Trace retroperitoneal hemorrhage with LEFT iliopsoas and LEFT
piriformis intramuscular hematomas. Status post recent LEFT femur
fracture ORIF.

## 2017-08-25 DIAGNOSIS — D638 Anemia in other chronic diseases classified elsewhere: Secondary | ICD-10-CM | POA: Diagnosis not present

## 2017-08-25 DIAGNOSIS — E1110 Type 2 diabetes mellitus with ketoacidosis without coma: Secondary | ICD-10-CM | POA: Diagnosis not present

## 2017-08-25 DIAGNOSIS — R1319 Other dysphagia: Secondary | ICD-10-CM | POA: Diagnosis not present

## 2017-08-26 DIAGNOSIS — I10 Essential (primary) hypertension: Secondary | ICD-10-CM | POA: Diagnosis not present

## 2017-08-26 DIAGNOSIS — E119 Type 2 diabetes mellitus without complications: Secondary | ICD-10-CM | POA: Diagnosis not present

## 2017-08-26 DIAGNOSIS — D638 Anemia in other chronic diseases classified elsewhere: Secondary | ICD-10-CM | POA: Diagnosis not present

## 2017-08-28 DIAGNOSIS — D638 Anemia in other chronic diseases classified elsewhere: Secondary | ICD-10-CM | POA: Diagnosis not present

## 2017-08-28 DIAGNOSIS — R1319 Other dysphagia: Secondary | ICD-10-CM | POA: Diagnosis not present

## 2017-08-28 DIAGNOSIS — E119 Type 2 diabetes mellitus without complications: Secondary | ICD-10-CM | POA: Diagnosis not present

## 2017-09-01 DIAGNOSIS — Z86718 Personal history of other venous thrombosis and embolism: Secondary | ICD-10-CM | POA: Diagnosis not present

## 2017-09-01 DIAGNOSIS — E119 Type 2 diabetes mellitus without complications: Secondary | ICD-10-CM | POA: Diagnosis not present

## 2017-09-11 IMAGING — CR DG CHEST 2V
2 series · 2 of 2 positions shown · non-contrast
Comparison: 06/15/2016

CLINICAL DATA: Shortness of breath. Patient was found outside
unresponsive. No injuries. No fall. Slurred speech.

EXAM:
CHEST  2 VIEW

[chest pa]
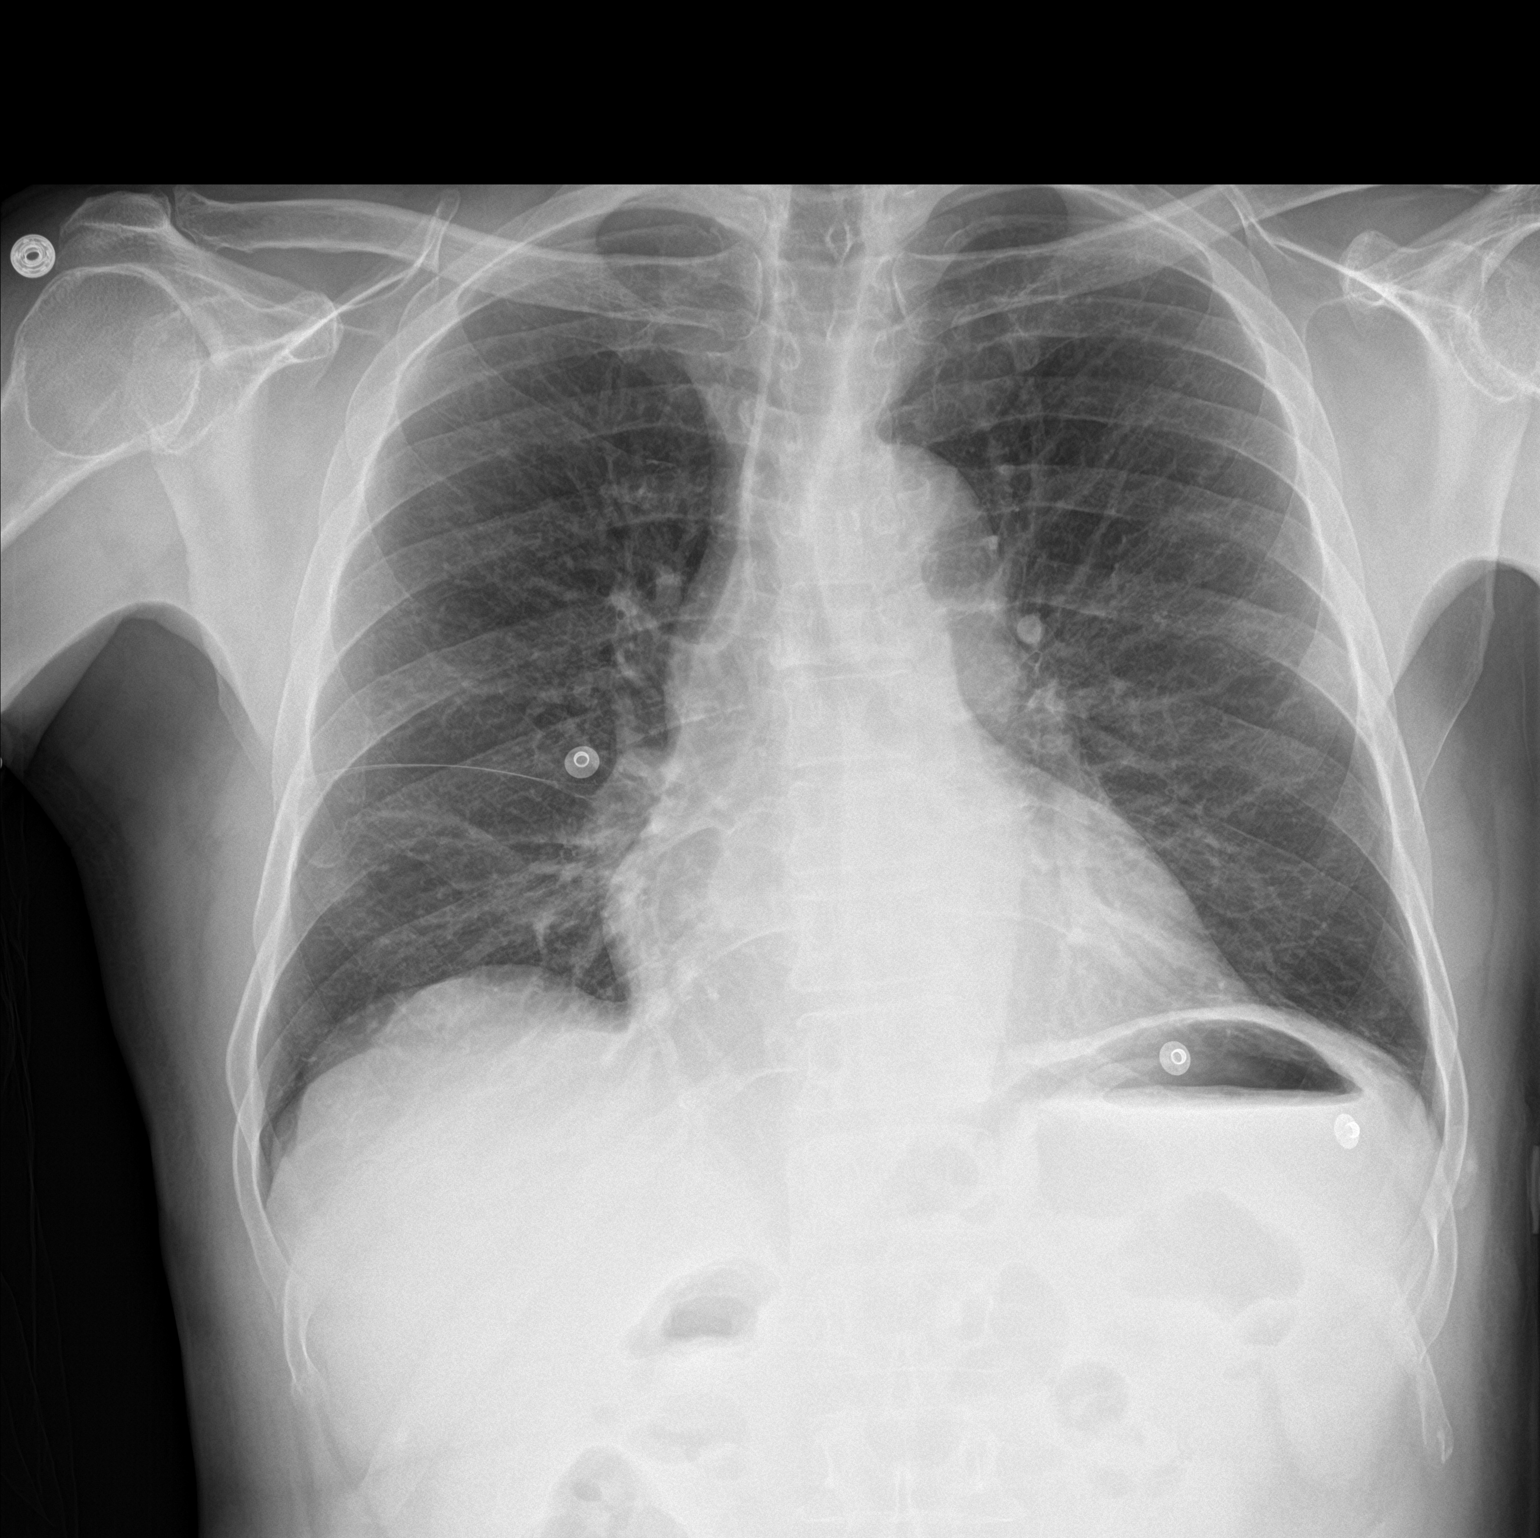

[chest lat]
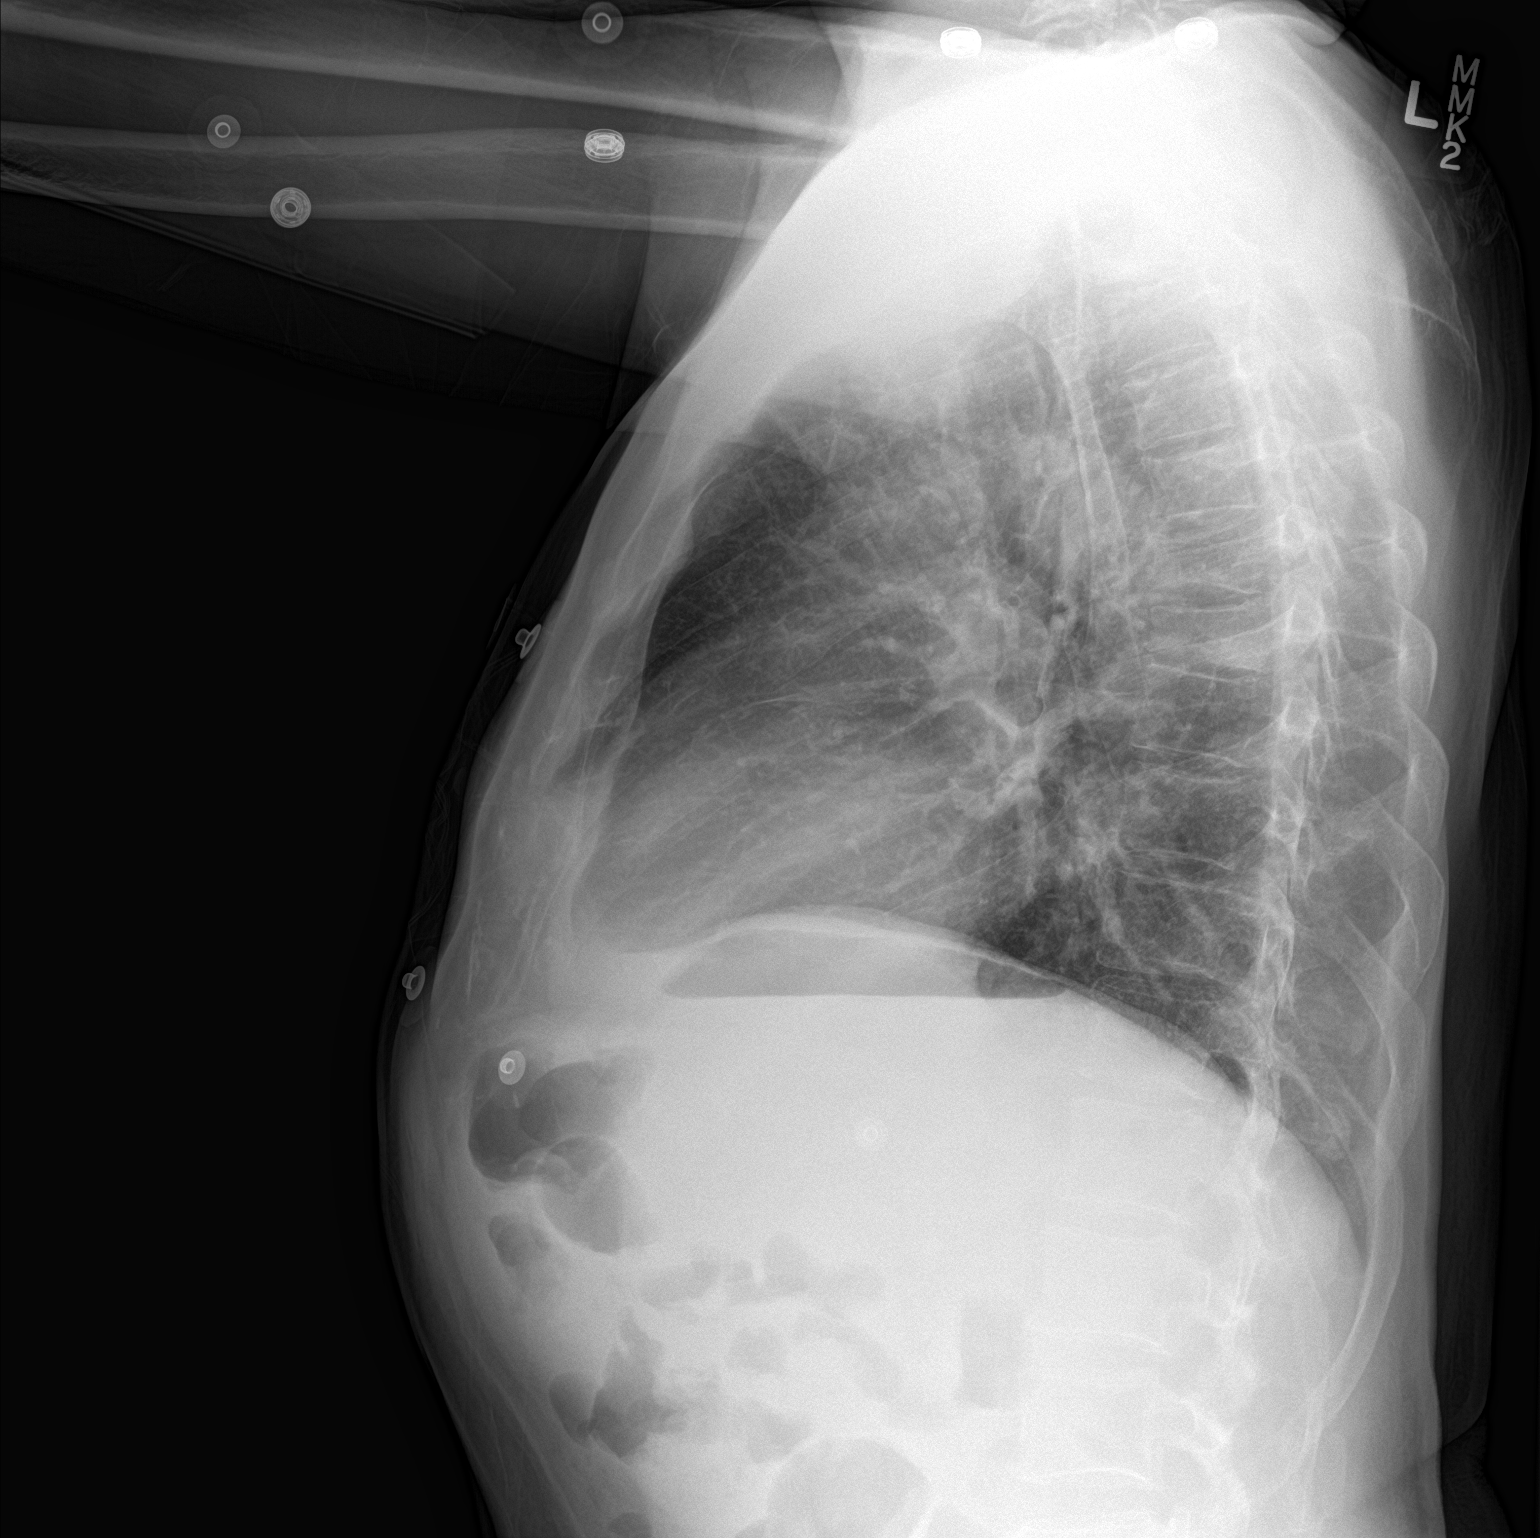

[2 of 2 positions shown; findings below may reference images not displayed]

FINDINGS: Normal heart size and pulmonary vascularity. Lungs appear clear and
expanded. No focal airspace disease or consolidation. No blunting of
costophrenic angles. No pneumothorax. Mediastinal contours appear
intact. Tortuous aorta. Anterior wedge deformities of mid thoracic
vertebra, probably represent T6 and T7. One of the levels has
progressed since previous chest CT from 11/02/2015.
IMPRESSION: No evidence of active pulmonary disease. Mid thoracic vertebral
compression deformity with progression since 11/02/2015.

## 2017-09-12 IMAGING — NM NM PULMONARY VENT & PERF
16 series · 16 of 16 positions shown · non-contrast
Comparison: None.

CLINICAL DATA: Syncope, dyspnea, recent surgery for left leg
fracture

EXAM:
NUCLEAR MEDICINE VENTILATION - PERFUSION LUNG SCAN
TECHNIQUE: Ventilation images were obtained in multiple projections using
inhaled aerosol Dc-TTm DTPA. Perfusion images were obtained in
multiple projections after intravenous injection of Dc-TTm MAA.
RADIOPHARMACEUTICALS:  32.0 mCi 6echnetium-JJm DTPA aerosol
inhalation and 4.4 mCi 6echnetium-JJm MAA IV

[Series 1: ant/post vent · 4.14mm/px · 1 of 1 slices shown (1 of 2)]
[im 1/1]
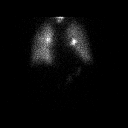

[Series 1: ant/post vent · 4.14mm/px · 1 of 1 slices shown (2 of 2)]
[im 1/1  full-range]
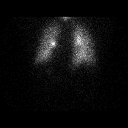

[Series 2: lao/rpo vent · 4.14mm/px · 1 of 1 slices shown (1 of 2)]
[im 1/1]
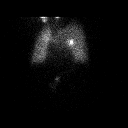

[Series 2: lao/rpo vent · 4.14mm/px · 1 of 1 slices shown (2 of 2)]
[im 1/1  full-range]
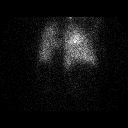

[Series 3: lpo/rao vent · 4.14mm/px · 1 of 1 slices shown (1 of 2)]
[im 1/1  full-range]
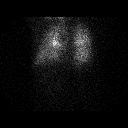

[Series 3: lpo/rao vent · 4.14mm/px · 1 of 1 slices shown (2 of 2)]
[im 1/1]
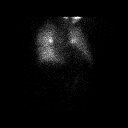

[Series 4: lt lat/rt lat vent · 4.14mm/px · 1 of 1 slices shown (1 of 2)]
[im 1/1]
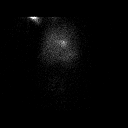

[Series 4: lt lat/rt lat vent · 4.14mm/px · 1 of 1 slices shown (2 of 2)]
[im 1/1]
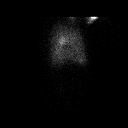

[Series 5: lt lat/rt lat perf · 4.14mm/px · 1 of 1 slices shown (1 of 2)]
[im 1/1]
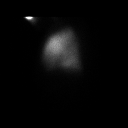

[Series 5: lt lat/rt lat perf · 4.14mm/px · 1 of 1 slices shown (2 of 2)]
[im 1/1]
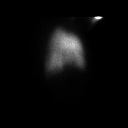

[Series 6: lpo/rao perf · 4.14mm/px · 1 of 1 slices shown (1 of 2)]
[im 1/1]
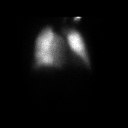

[Series 6: lpo/rao perf · 4.14mm/px · 1 of 1 slices shown (2 of 2)]
[im 1/1]
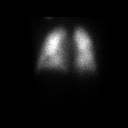

[Series 7: ant/post perf · 4.14mm/px · 1 of 1 slices shown (1 of 2)]
[im 1/1]
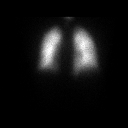

[Series 7: ant/post perf · 4.14mm/px · 1 of 1 slices shown (2 of 2)]
[im 1/1]
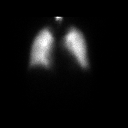

[Series 8: lao/rpo perf · 4.14mm/px · 1 of 1 slices shown (1 of 2)]
[im 1/1]
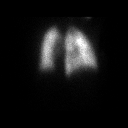

[Series 8: lao/rpo perf · 4.14mm/px · 1 of 1 slices shown (2 of 2)]
[im 1/1]
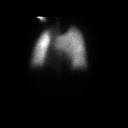

[16 of 16 positions shown; findings below may reference images not displayed]

FINDINGS: Ventilation: No focal ventilation defect. Mild clumping the central
airways.

Perfusion: No wedge shaped peripheral perfusion defects to suggest
acute pulmonary embolism.

Corresponding chest radiographs dated 07/05/2016 are clear.
IMPRESSION: No evidence of pulmonary embolism.

## 2017-09-12 IMAGING — CT CT ABD-PELV W/O CM
2 of 4 series · 15 of 46 positions shown, 17 images · non-contrast
Comparison: 06/17/2016 CT O

CLINICAL DATA: Patient is syncopal after being placed back on
aspirin and Plavix. History of intramedullary nail placement
06/16/2016 and retroperitoneal hematoma diagnosed on 06/17/2016 CT.
Reassess retroperitoneal hemorrhage. Go

EXAM:
CT ABDOMEN AND PELVIS WITHOUT CONTRAST
TECHNIQUE: Multidetector CT imaging of the abdomen and pelvis was performed
following the standard protocol without IV contrast.

[Series 2: a/p w/o 5mm · axial · non-contrast · 0.66mm/px · z∈[+962,+1312]mm · 12 of 84 slices shown, 14 images]
[im 7/84  soft-tissue]
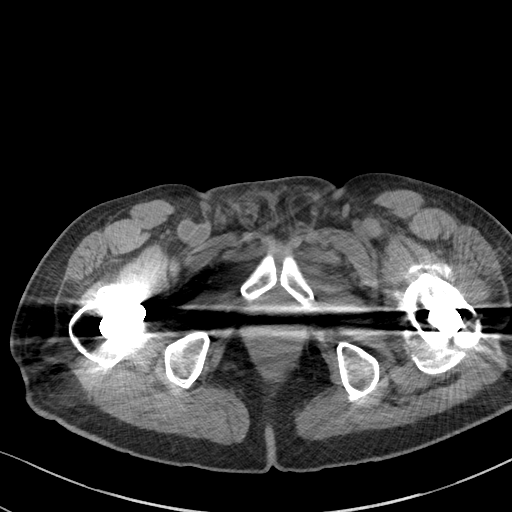
[im 7/84  bone]
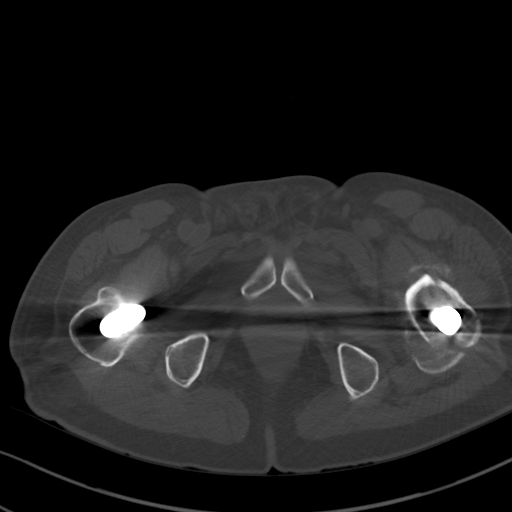
[im 13/84  soft-tissue]
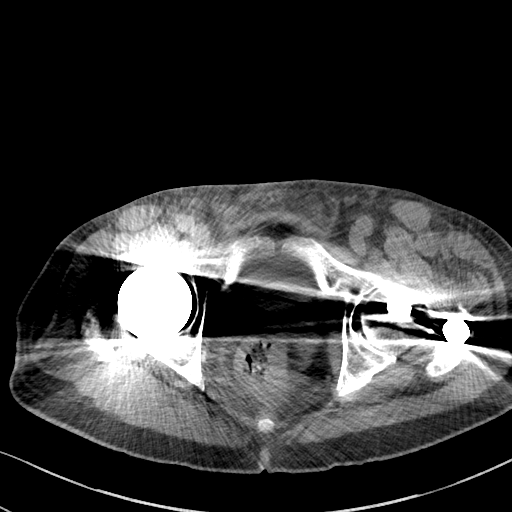
[im 20/84  soft-tissue]
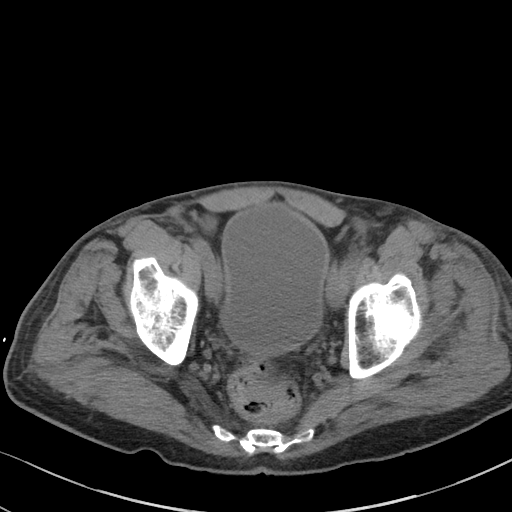
[im 26/84  soft-tissue]
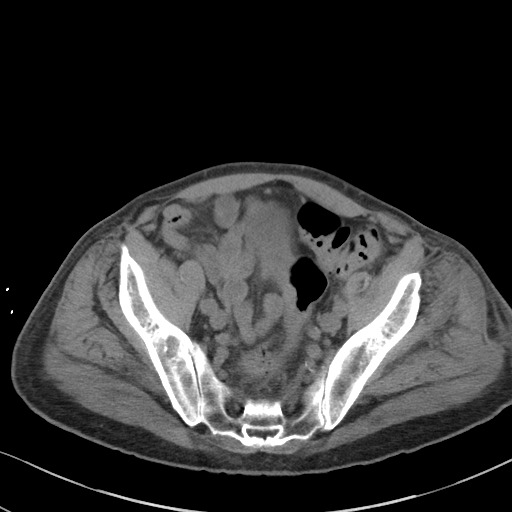
[im 32/84  soft-tissue]
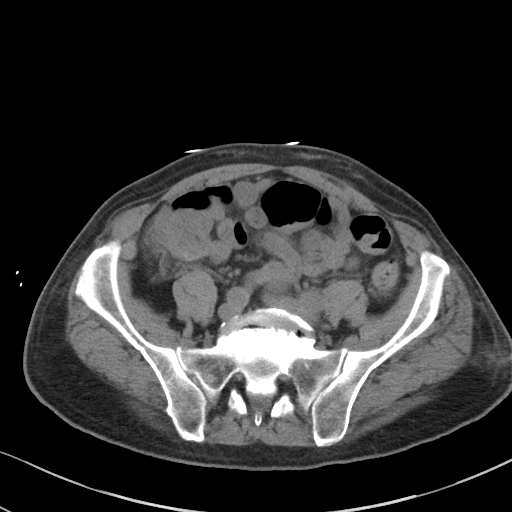
[im 39/84  soft-tissue]
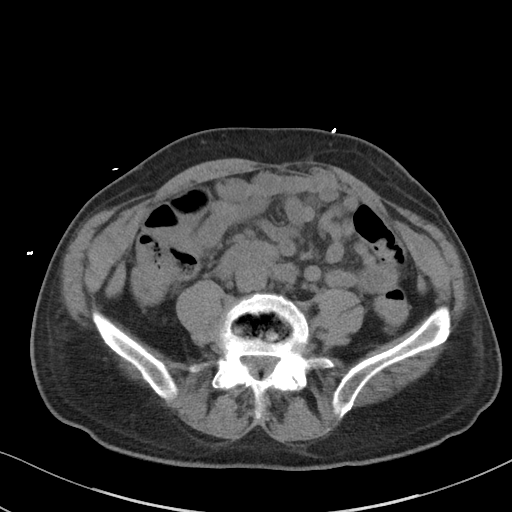
[im 45/84  soft-tissue]
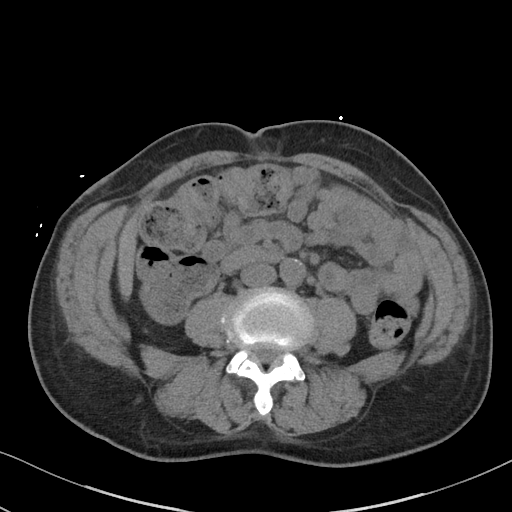
[im 52/84  soft-tissue]
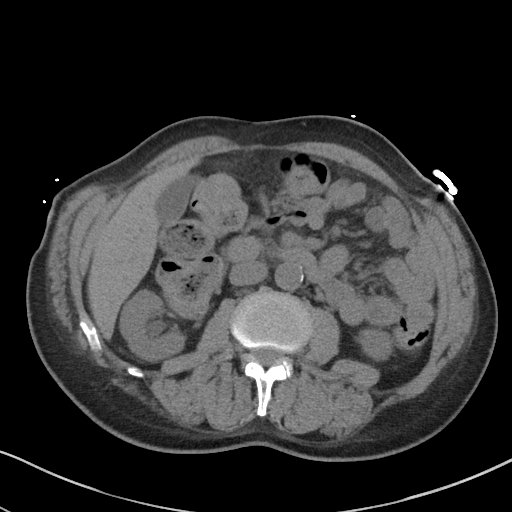
[im 58/84  soft-tissue]
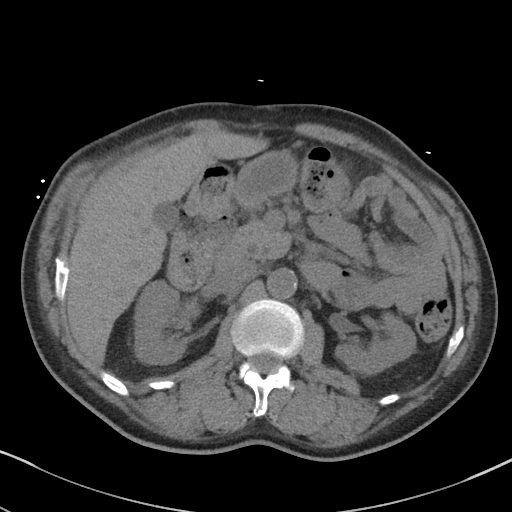
[im 58/84  bone]
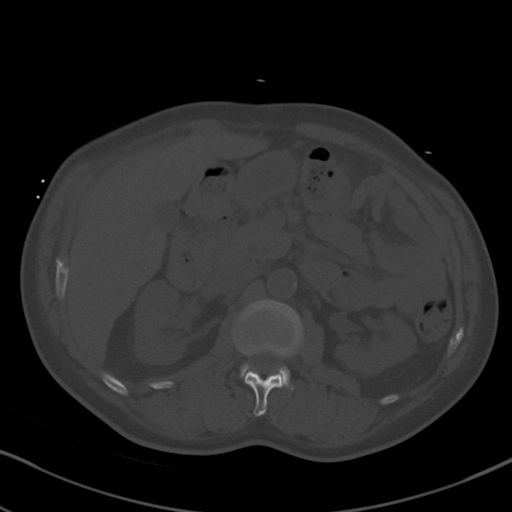
[im 64/84  soft-tissue]
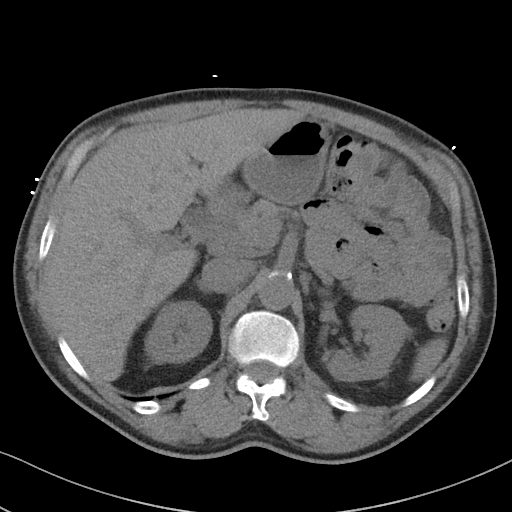
[im 71/84  soft-tissue]
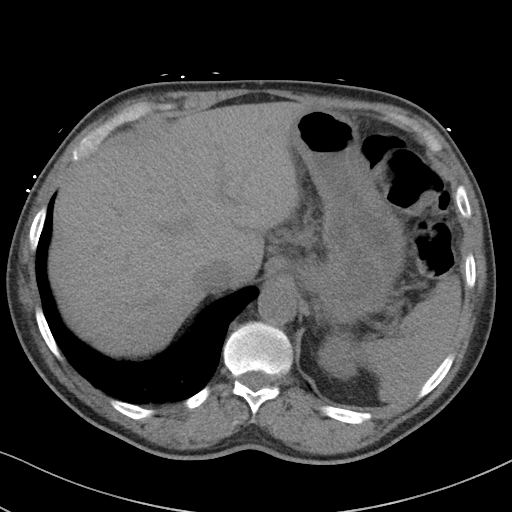
[im 77/84  soft-tissue]
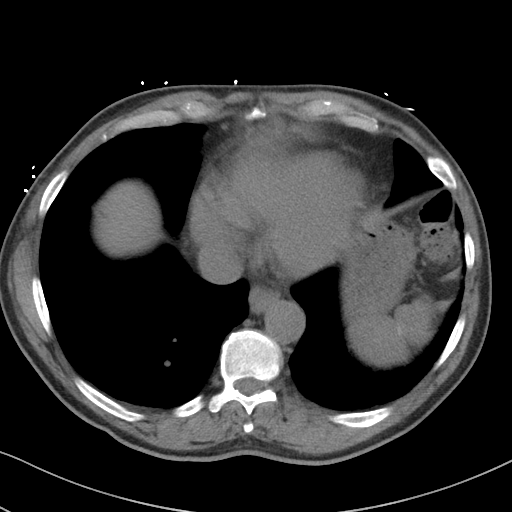

[Series 5: a/p w/o cor · coronal · non-contrast · 0.61mm/px · 3 of 151 slices shown]
[im 51/151  soft-tissue]
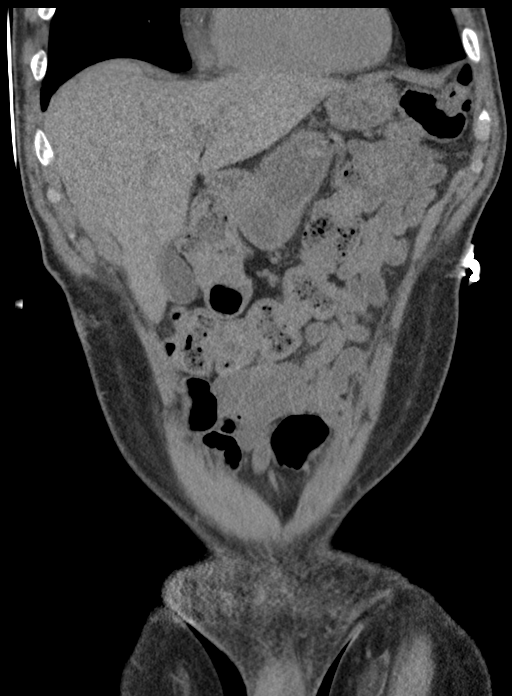
[im 67/151  soft-tissue]
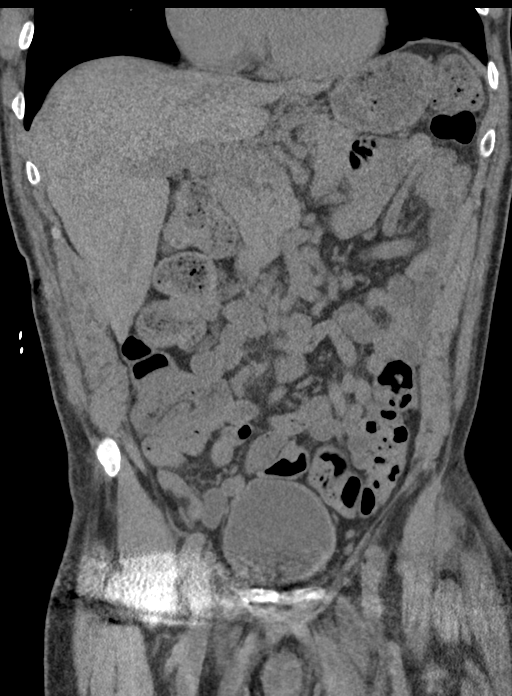
[im 84/151  soft-tissue]
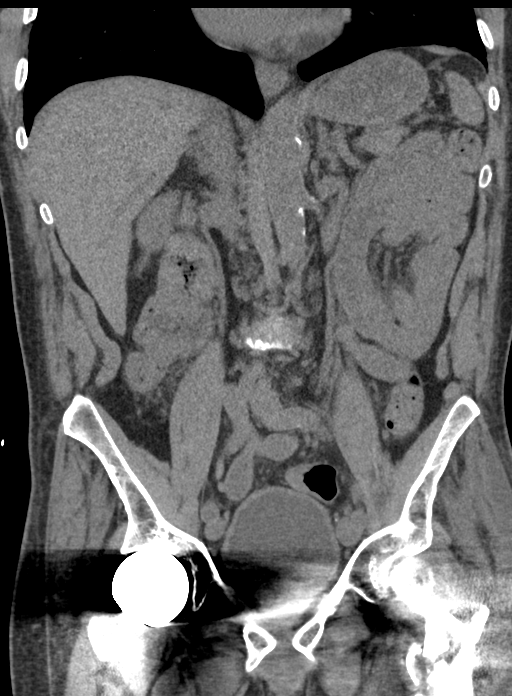

[15 of 46 positions shown; findings below may reference images not displayed]

FINDINGS: Lower chest: Bibasilar dependent atelectasis. Partial clearing of a
small focus of right lower lobe atelectasis/consolidation. No
pneumothorax, effusion or pneumonic consolidations. Minimal pleural
thickening along the right major fissure.

Hepatobiliary: No focal liver abnormality is seen. No gallstones,
gallbladder wall thickening, or biliary dilatation.

Pancreas: Unremarkable. No pancreatic ductal dilatation or
surrounding inflammatory changes.

Spleen: Trace linear calcifications along a splenic cleft. No
splenomegaly. No subcapsular fluid.

Adrenals/Urinary Tract: Stable left 14 mm interpolar and 12 mm lower
pole renal cysts. No genitourinary calculi nor obstructive uropathy.
Normal appearance of the bladder. The

Stomach/Bowel: Normal bowel rotation. No bowel obstruction or acute
inflammation. Moderate amount of fecal residue within large bowel.
Normal appendix.

Vascular/Lymphatic: Stable scattered atherosclerotic calcifications
noted along the course the normal calibered aorta and common iliacs.
No lymphadenopathy.

Reproductive: Prostate obscured by streak artifacts from the
patient's right hip arthroplasty and left dynamic compression screw
fixation hardware.

Other: No evidence of retroperitoneal hemorrhage.

Musculoskeletal: Resolution of left iliopsoas and left piriformis
intramuscular hematomas. Small fat containing umbilical hernia.
Vacuum disc phenomena at L4-5 and L5-S1 with disc space narrowing.
Broad-based disc-osteophyte complexes are seen at these levels.
Moderate canal stenosis at L5-S1. Marked L4-5 and L5-S1 neural
foraminal narrowing. No acute osseous abnormality.
IMPRESSION: No recurrent retroperitoneal hemorrhage. Resolution of left-sided
iliopsoas and piriformis hematomas.

## 2017-10-02 ENCOUNTER — Other Ambulatory Visit: Payer: Self-pay

## 2017-10-02 MED ORDER — INSULIN GLARGINE 100 UNIT/ML SOLOSTAR PEN: 5.0000 [IU] | PEN_INJECTOR | Freq: Every day | SUBCUTANEOUS | 0 refills | Status: DC

## 2017-10-24 IMAGING — CT CT HEAD W/O CM
6 of 12 series · 16 of 47 positions shown, 17 images · non-contrast
Comparison: None.

CLINICAL DATA: Found unresponsive with left orbital hematoma

EXAM:
CT HEAD WITHOUT CONTRAST
CT CERVICAL SPINE WITHOUT CONTRAST
CT ORBITS WITHOUT CONTRAST
TECHNIQUE: Multidetector CT imaging of the head, orbits and cervical spine was
performed following the standard protocol without intravenous
contrast. Multiplanar CT image reconstructions were also generated.

[Series 4: head without · axial · non-contrast · 0.48mm/px · z∈[-64,+91]mm · 3 of 32 slices shown, 4 images]
[im 1/32  brain]
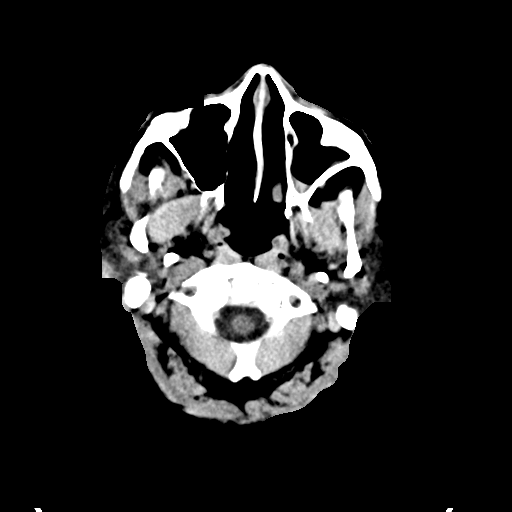
[im 1/32  bone]
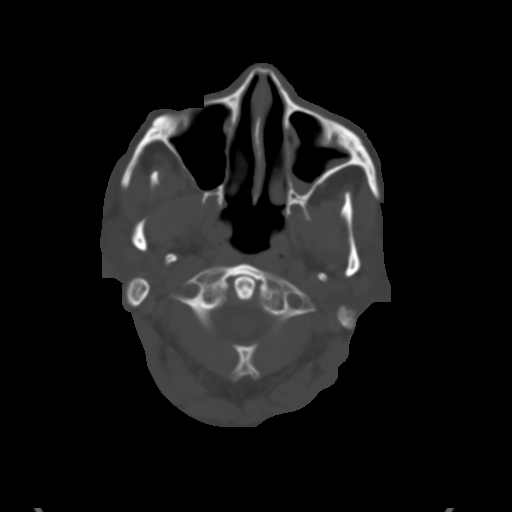
[im 16/32  brain]
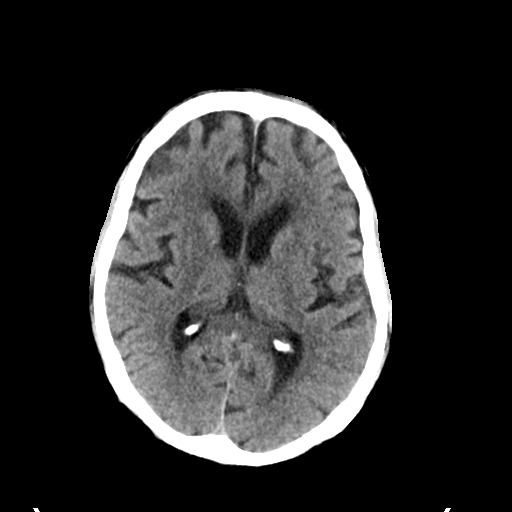
[im 32/32  brain]
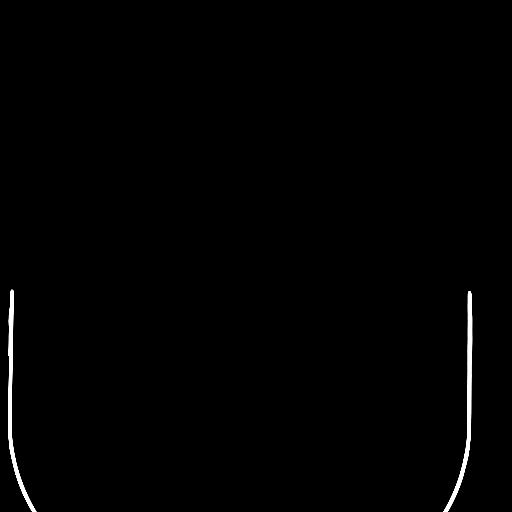

[Series 5: head bone · axial · 0.45mm/px · z∈[-36,+58]mm · 4 of 79 slices shown]
[im 16/79  bone]
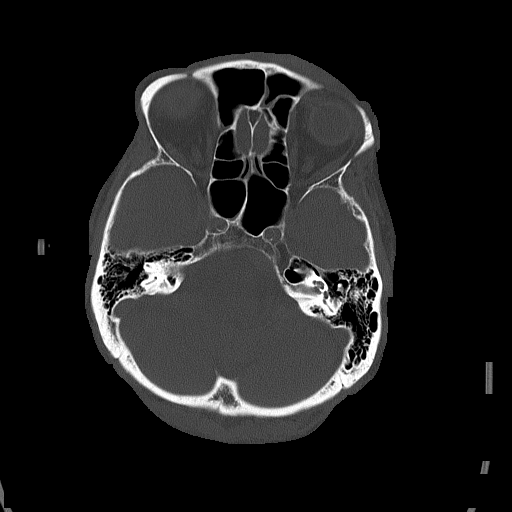
[im 32/79  bone]
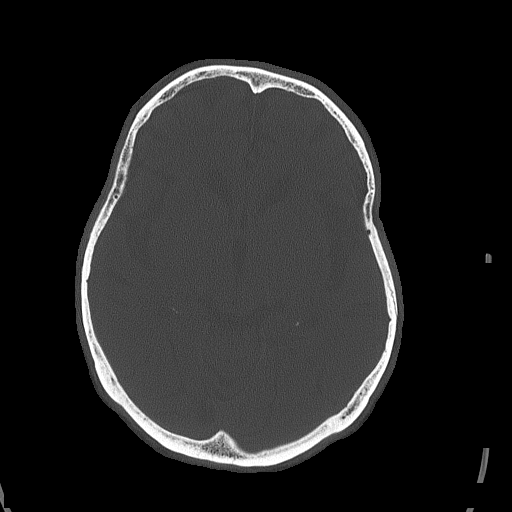
[im 47/79  bone]
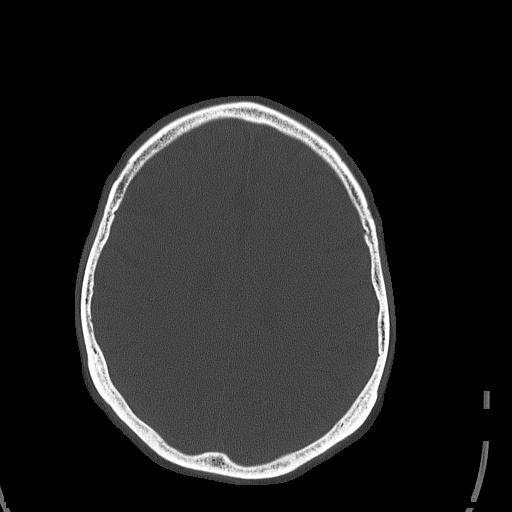
[im 63/79  bone]
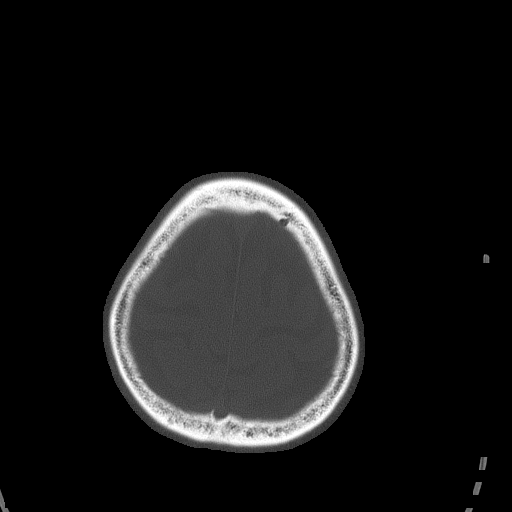

[Series 8: facialbone 2.0 st · axial · 0.33mm/px · z∈[-90,-34]mm · 3 of 57 slices shown]
[im 15/57  brain]
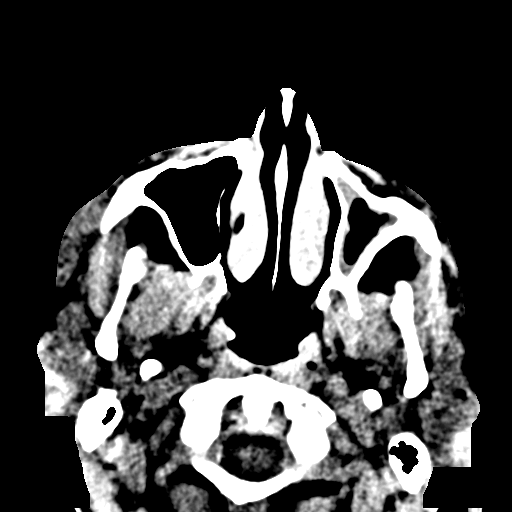
[im 29/57  brain]
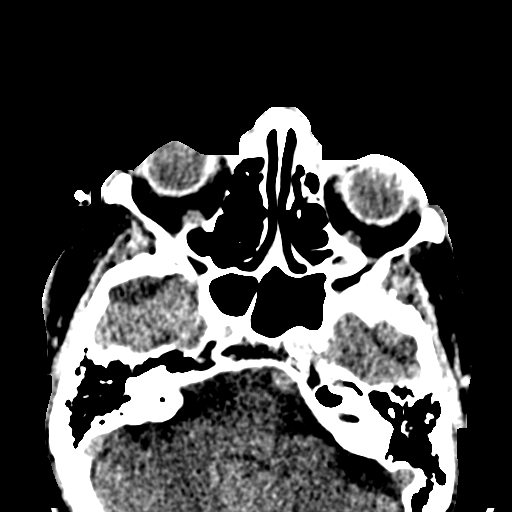
[im 43/57  brain]
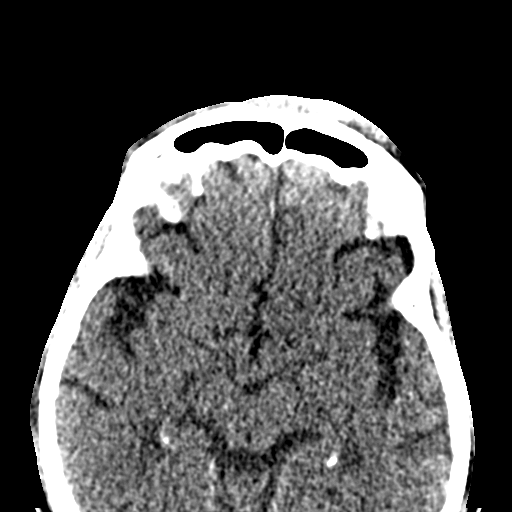

[Series 12: facialbone 2.0 cor st · coronal · 0.25mm/px · 2 of 75 slices shown]
[im 25/75  brain]
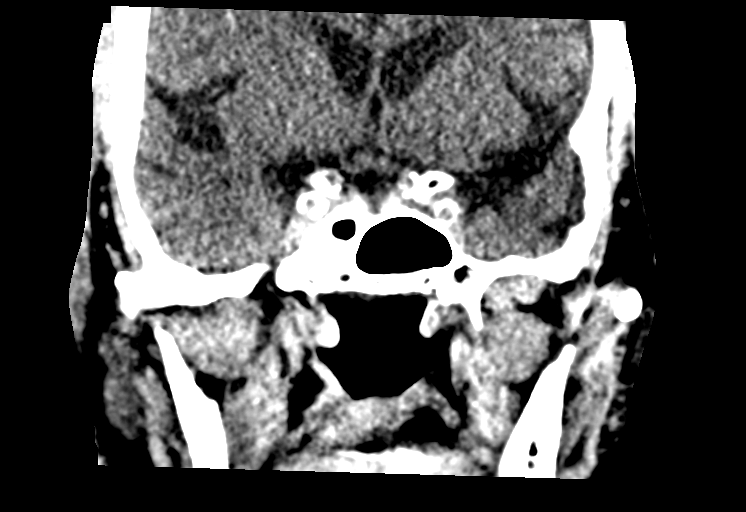
[im 50/75  brain]
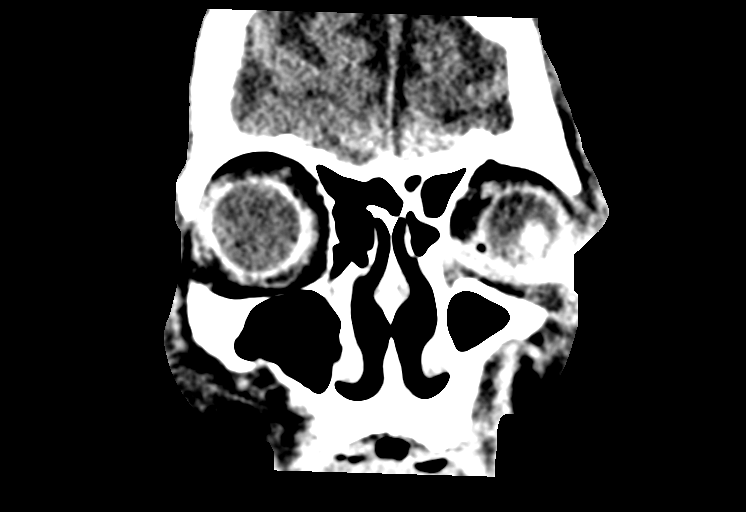

[Series 13: facialbone 2.0 sag st · sagittal · 0.22mm/px · 1 of 101 slices shown]
[im 51/101  brain]
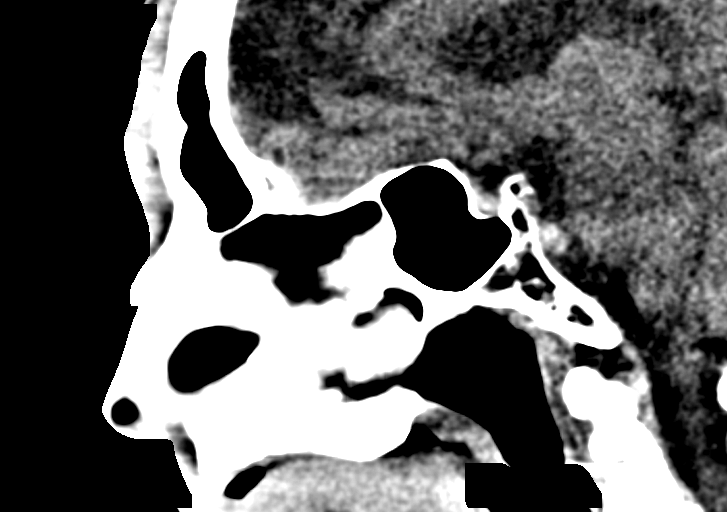

[Series 14: c_spine 2.0 st · axial · 0.30mm/px · z∈[-221,-163]mm · 3 of 102 slices shown]
[im 15/102  brain]
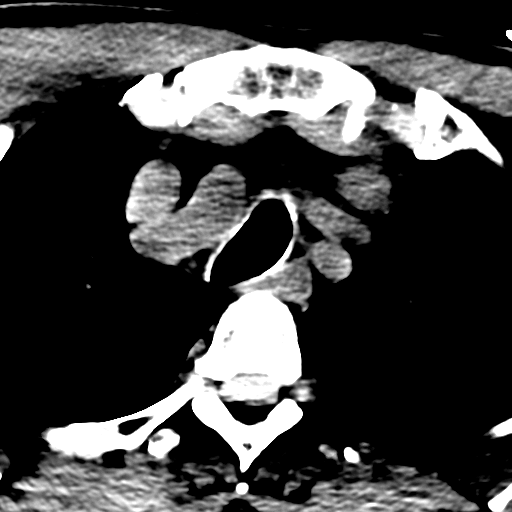
[im 29/102  brain]
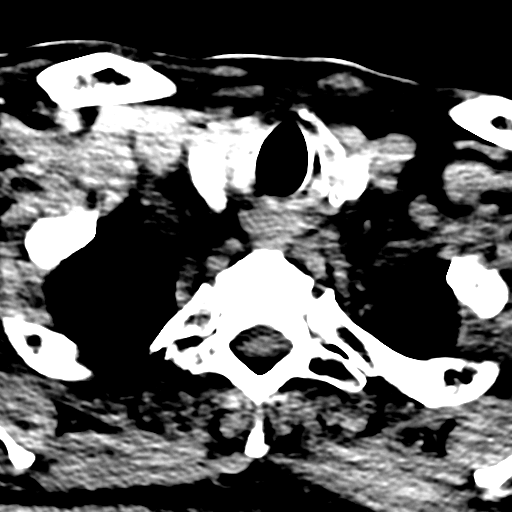
[im 44/102  brain]
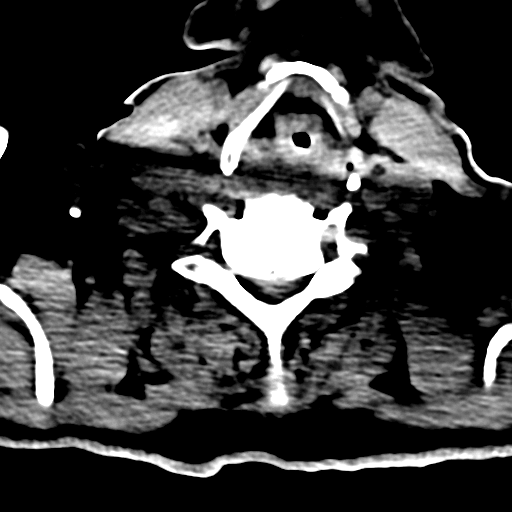

[16 of 47 positions shown; findings below may reference images not displayed]

FINDINGS: CT HEAD FINDINGS

Brain: No evidence of acute infarction, hemorrhage, hydrocephalus,
extra-axial collection or mass lesion/mass effect. Mild atrophic
changes are noted.

Vascular: No hyperdense vessel or unexpected calcification.

Skull: Normal. Negative for fracture or focal lesion.

Sinuses/Orbits: Mucosal thickening is noted within the left
maxillary antrum.

Other: Soft tissue swelling is noted over the left frontal region
with a small hematoma measuring 2.6 cm in greatest dimension.

CT CERVICAL SPINE FINDINGS

Alignment: Normal.

Skull base and vertebrae: 7 cervical segments are well visualized.
Vertebral body height is well maintained. Facet hypertrophic changes
are noted. No acute fracture is seen.

Soft tissues and spinal canal: No prevertebral fluid or swelling. No
visible canal hematoma.

Disc levels: Disc space narrowing is noted at C3-4, C4-5, C5-6 and
C6-7 with associated anterior and posterior osteophytes. This
contributes to multilevel mild spinal canal stenosis and neural
foraminal narrowing.

Upper chest: Negative.

Other: None

CT ORBITS FINDINGS

Orbits: The orbits and their contents are within normal limits. No
definitive orbital wall fracture is noted. No other fractures are
noted.

Visualized sinuses: Mucosal thickening is noted within the left
maxillary antrum.

Soft tissues: Left frontal soft tissue hematoma is noted consistent
with the recent injury.
IMPRESSION: CT of the head:  No acute intracranial abnormality noted.

Atrophic changes.

Left frontal soft tissue hematoma.

CT orbits: No acute abnormality of the orbits.

Left maxillary mucosal thickening.

Left frontal soft tissue hematoma.

CT of cervical spine:

Multilevel degenerative change without acute abnormality.

## 2017-11-13 ENCOUNTER — Ambulatory Visit: Payer: Medicare Other | Admitting: Family Medicine

## 2017-11-16 ENCOUNTER — Encounter (HOSPITAL_COMMUNITY): Payer: Self-pay | Admitting: Emergency Medicine

## 2017-11-16 ENCOUNTER — Ambulatory Visit: Payer: Medicare Other | Admitting: Family Medicine

## 2017-11-16 ENCOUNTER — Inpatient Hospital Stay (HOSPITAL_COMMUNITY)
Admission: EM | Admit: 2017-11-16 | Discharge: 2017-11-19 | DRG: 638 | Disposition: A | Discharge status: Home Health | Payer: Medicare Other | Attending: Internal Medicine | Admitting: Internal Medicine

## 2017-11-16 DIAGNOSIS — E1122 Type 2 diabetes mellitus with diabetic chronic kidney disease: Secondary | ICD-10-CM | POA: Diagnosis present

## 2017-11-16 DIAGNOSIS — Z8673 Personal history of transient ischemic attack (TIA), and cerebral infarction without residual deficits: Secondary | ICD-10-CM

## 2017-11-16 DIAGNOSIS — N183 Chronic kidney disease, stage 3 (moderate): Secondary | ICD-10-CM | POA: Diagnosis present

## 2017-11-16 DIAGNOSIS — R1010 Upper abdominal pain, unspecified: Secondary | ICD-10-CM | POA: Diagnosis not present

## 2017-11-16 DIAGNOSIS — E1110 Type 2 diabetes mellitus with ketoacidosis without coma: Principal | ICD-10-CM | POA: Diagnosis present

## 2017-11-16 DIAGNOSIS — Z681 Body mass index (BMI) 19 or less, adult: Secondary | ICD-10-CM | POA: Diagnosis not present

## 2017-11-16 DIAGNOSIS — I10 Essential (primary) hypertension: Secondary | ICD-10-CM | POA: Diagnosis not present

## 2017-11-16 DIAGNOSIS — I152 Hypertension secondary to endocrine disorders: Secondary | ICD-10-CM | POA: Diagnosis present

## 2017-11-16 DIAGNOSIS — R636 Underweight: Secondary | ICD-10-CM | POA: Diagnosis present

## 2017-11-16 DIAGNOSIS — Z794 Long term (current) use of insulin: Secondary | ICD-10-CM

## 2017-11-16 DIAGNOSIS — E860 Dehydration: Secondary | ICD-10-CM | POA: Diagnosis present

## 2017-11-16 DIAGNOSIS — Z79899 Other long term (current) drug therapy: Secondary | ICD-10-CM

## 2017-11-16 DIAGNOSIS — Z9114 Patient's other noncompliance with medication regimen: Secondary | ICD-10-CM

## 2017-11-16 DIAGNOSIS — I444 Left anterior fascicular block: Secondary | ICD-10-CM | POA: Diagnosis present

## 2017-11-16 DIAGNOSIS — E1165 Type 2 diabetes mellitus with hyperglycemia: Secondary | ICD-10-CM

## 2017-11-16 DIAGNOSIS — F17210 Nicotine dependence, cigarettes, uncomplicated: Secondary | ICD-10-CM | POA: Diagnosis present

## 2017-11-16 DIAGNOSIS — I129 Hypertensive chronic kidney disease with stage 1 through stage 4 chronic kidney disease, or unspecified chronic kidney disease: Secondary | ICD-10-CM | POA: Diagnosis present

## 2017-11-16 DIAGNOSIS — N185 Chronic kidney disease, stage 5: Secondary | ICD-10-CM | POA: Diagnosis present

## 2017-11-16 DIAGNOSIS — N179 Acute kidney failure, unspecified: Secondary | ICD-10-CM | POA: Diagnosis not present

## 2017-11-16 DIAGNOSIS — R42 Dizziness and giddiness: Secondary | ICD-10-CM | POA: Diagnosis not present

## 2017-11-16 DIAGNOSIS — Z7982 Long term (current) use of aspirin: Secondary | ICD-10-CM

## 2017-11-16 DIAGNOSIS — D649 Anemia, unspecified: Secondary | ICD-10-CM | POA: Diagnosis present

## 2017-11-16 DIAGNOSIS — D631 Anemia in chronic kidney disease: Secondary | ICD-10-CM | POA: Diagnosis present

## 2017-11-16 DIAGNOSIS — R109 Unspecified abdominal pain: Secondary | ICD-10-CM | POA: Diagnosis not present

## 2017-11-16 DIAGNOSIS — Z96641 Presence of right artificial hip joint: Secondary | ICD-10-CM | POA: Diagnosis present

## 2017-11-16 DIAGNOSIS — E1121 Type 2 diabetes mellitus with diabetic nephropathy: Secondary | ICD-10-CM | POA: Diagnosis present

## 2017-11-16 DIAGNOSIS — IMO0002 Reserved for concepts with insufficient information to code with codable children: Secondary | ICD-10-CM

## 2017-11-16 DIAGNOSIS — E1159 Type 2 diabetes mellitus with other circulatory complications: Secondary | ICD-10-CM | POA: Diagnosis present

## 2017-11-16 DIAGNOSIS — F329 Major depressive disorder, single episode, unspecified: Secondary | ICD-10-CM | POA: Diagnosis present

## 2017-11-16 DIAGNOSIS — Z91148 Patient's other noncompliance with medication regimen for other reason: Secondary | ICD-10-CM

## 2017-11-16 DIAGNOSIS — R5383 Other fatigue: Secondary | ICD-10-CM | POA: Diagnosis not present

## 2017-11-16 DIAGNOSIS — E871 Hypo-osmolality and hyponatremia: Secondary | ICD-10-CM

## 2017-11-16 DIAGNOSIS — E875 Hyperkalemia: Secondary | ICD-10-CM | POA: Diagnosis present

## 2017-11-16 LAB — URINALYSIS, ROUTINE W REFLEX MICROSCOPIC
Bacteria, UA: NONE SEEN
Bilirubin Urine: NEGATIVE
Glucose, UA: >=500 mg/dL — AB
Hgb urine dipstick: NEGATIVE
Ketones, ur: 20 mg/dL — AB
Leukocytes, UA: NEGATIVE
Nitrite: NEGATIVE
Protein, ur: NEGATIVE mg/dL
RBC / HPF: NONE SEEN RBC/hpf (ref 0–5)
Specific Gravity, Urine: 1.013 (ref 1.005–1.030)
Squamous Epithelial / LPF: NONE SEEN
pH: 6 (ref 5.0–8.0)

## 2017-11-16 LAB — COMPREHENSIVE METABOLIC PANEL
ALT: 30 U/L (ref 17–63)
AST: 34 U/L (ref 15–41)
Albumin: 3.3 g/dL — ABNORMAL LOW (ref 3.5–5.0)
Alkaline Phosphatase: 159 U/L — ABNORMAL HIGH (ref 38–126)
Anion gap: 17 — ABNORMAL HIGH (ref 5–15)
BUN: 72 mg/dL — ABNORMAL HIGH (ref 6–20)
CO2: 17 mmol/L — ABNORMAL LOW (ref 22–32)
Calcium: 9 mg/dL (ref 8.9–10.3)
Chloride: 93 mmol/L — ABNORMAL LOW (ref 101–111)
Creatinine, Ser: 1.92 mg/dL — ABNORMAL HIGH (ref 0.61–1.24)
GFR calc Af Amer: 39 mL/min — ABNORMAL LOW (ref >60)
GFR calc non Af Amer: 33 mL/min — ABNORMAL LOW (ref >60)
Glucose, Bld: 584 mg/dL (ref 65–99)
Potassium: 5.1 mmol/L (ref 3.5–5.1)
Sodium: 127 mmol/L — ABNORMAL LOW (ref 135–145)
Total Bilirubin: 1 mg/dL (ref 0.3–1.2)
Total Protein: 7.6 g/dL (ref 6.5–8.1)

## 2017-11-16 LAB — CBC WITH DIFFERENTIAL/PLATELET
Basophils Absolute: 0 10*3/uL (ref 0.0–0.1)
Basophils Relative: 0 %
Eosinophils Absolute: 0.3 10*3/uL (ref 0.0–0.7)
Eosinophils Relative: 4 %
HCT: 32.9 % — ABNORMAL LOW (ref 39.0–52.0)
Hemoglobin: 11 g/dL — ABNORMAL LOW (ref 13.0–17.0)
Lymphocytes Relative: 24 %
Lymphs Abs: 1.8 10*3/uL (ref 0.7–4.0)
MCH: 29.3 pg (ref 26.0–34.0)
MCHC: 33.4 g/dL (ref 30.0–36.0)
MCV: 87.5 fL (ref 78.0–100.0)
Monocytes Absolute: 0.5 10*3/uL (ref 0.1–1.0)
Monocytes Relative: 6 %
Neutro Abs: 4.9 10*3/uL (ref 1.7–7.7)
Neutrophils Relative %: 66 %
Platelets: 329 10*3/uL (ref 150–400)
RBC: 3.76 MIL/uL — ABNORMAL LOW (ref 4.22–5.81)
RDW: 13.7 % (ref 11.5–15.5)
WBC: 7.5 10*3/uL (ref 4.0–10.5)

## 2017-11-16 LAB — LIPASE, BLOOD: Lipase: 47 U/L (ref 11–51)

## 2017-11-16 LAB — CBG MONITORING, ED
Glucose-Capillary: 149 mg/dL — ABNORMAL HIGH (ref 65–99)
Glucose-Capillary: >600 mg/dL (ref 65–99)

## 2017-11-16 LAB — TROPONIN I: Troponin I: <0.03 ng/mL (ref <0.03)

## 2017-11-16 MED ORDER — SODIUM CHLORIDE 0.9 % IV SOLN
INTRAVENOUS | Status: DC
  Administered 2017-11-16: 22:00:00 via INTRAVENOUS

## 2017-11-16 MED ORDER — SODIUM CHLORIDE 0.9 % IV BOLUS (SEPSIS)
500.0000 mL | Freq: Once | INTRAVENOUS | Status: AC
  Administered 2017-11-16: 500 mL via INTRAVENOUS

## 2017-11-16 NOTE — ED Notes (Signed)
Failed attmpt to collect labs 

## 2017-11-16 NOTE — ED Provider Notes (Signed)
Patient placed in Quick Look pathway, seen and evaluated   Chief Complaint: Abdominal weakness and dizziness  HPI:   72 year old male presents with acute onset of abdominal weakness and dizziness that occurred at rest just prior to arrival.   he denies any new focal deficits..  No severe headaches or neurological symptoms.  ROS: No vomiting or diarrhea  (one)  Physical Exam:   Gen: No distress  Neuro: Awake and Alert  Skin: Warm    Focused Exam: Patient's abdomen is soft nontender nondistended   Initiation of care has begun. The patient has been counseled on the process, plan, and necessity for staying for the completion/evaluation, and the remainder of the medical screening examination    Lacretia Leigh, MD 11/16/17 1350

## 2017-11-16 NOTE — ED Provider Notes (Signed)
[facility] DEPT Provider Note   CSN: [id number] Arrival date & time: 11/16/17  1316     History   Chief Complaint Chief Complaint  Patient presents with  . Dizziness  . Abdominal Pain    HPI Trent Gabler is a 72 y.o. male.  HPI Patient had an episode of abdominal pain and dizziness.  He was eating and had upper abdominal pain and then began to feel lightheaded.  Almost all years, passed out.  Had a 6-1/2-hour wait to get back to the room and states he feels better now.  Sugars have been running a little high but have been high is slightly above 300.  No dysuria.  No fevers.  Has had a decreased appetite however.  History of diabetes.  No chest pain.  No trouble breathing.  No headache.  No confusion.  Walks with a walker at baseline. Past Medical History:  Diagnosis Date  . 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 Closed comminuted intertrochanteric fracture of left femur ([place])   . Diabetes mellitus without complication ([place])   . Diabetic hyperosmolar non-ketotic state ([place]) 05/15/2016  . DKA (diabetic ketoacidoses) ([place]) 05/09/2016  . Hypertension   . Postoperative anemia due to acute blood loss 06/18/2016  . Retroperitoneal hematoma 06/18/2016  . Stroke ([place])   . Vitamin B 12 deficiency 06/18/2016    Patient Active Problem List   Diagnosis Date Noted  . Respiratory failure ([place])   . DKA, type 2 ([place]) 08/01/2017  . History of cerebrovascular accident (CVA) in adulthood 07/25/2017  . HTN (hypertension) 07/25/2017  . Depression with anxiety 07/25/2017  . Left ventricular diastolic dysfunction 73/22/0254  . Dementia 07/25/2017  . Acute kidney injury ([place]) 07/25/2017  . 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 Hypoglycemia due to insulin 06/27/2017  . CKD (chronic kidney disease), stage III ([place]) 06/27/2017  . Diabetes mellitus, insulin dependent (IDDM), controlled ([place]) 06/27/2017  . History of  CVA in adulthood 06/27/2017  . Hypertension 06/27/2017  . Hypothermia 06/27/2017  . Lactic acid increased 06/27/2017  . DKA (diabetic ketoacidoses) ([place]) 05/03/2017  . DKA (diabetic ketoacidosis) ([place]) 03/22/2017  . Elbow pain, right 03/22/2017  . Abdominal pain due to injury 03/22/2017  . Rib pain on left side 01/29/2017  . Dehydration   . Urinary tract infection without hematuria   . Hyperkalemia 01/16/2017  . Acute metabolic encephalopathy 27/03/2375  . Elevated lactic acid level   . AKI (acute kidney injury) ([place]) 12/14/2016  . Hypoglycemia   . Sepsis ([place]) 12/13/2016  . Uncontrolled type 2 diabetes mellitus with complication ([place])   . Acute renal failure superimposed on stage 3 chronic kidney disease ([place])   . Noncompliance with medication regimen 08/17/2016  . History of DVT (deep vein thrombosis) 07/06/2016  . Retroperitoneal hematoma 06/18/2016  . Vitamin B 12 deficiency 06/18/2016  . Hyperglycemia 05/15/2016  . Anxiety 05/15/2016  . History of CVA (cerebrovascular accident) 05/15/2016  . Hypoglycemia, coma ([place]) 11/02/2015  . Narcotic dependency, continuous ([place]) 11/02/2015  . Uncontrolled type 2 diabetes mellitus with diabetic nephropathy, with long-term current use of insulin ([place]) 08/12/2015  . Depression 08/12/2015  . CKD (chronic kidney disease) stage 3, GFR 30-59 ml/min ([name]) 03/20/2014    Past Surgical History:  Procedure Laterality Date  . HIP ARTHROPLASTY Right 02/05/2013   Procedure: ARTHROPLASTY BIPOLAR HIP;  Surgeon: [name], MD;  Location: [facility];  Service: Orthopedics;  Laterality: Right;  . INTRAMEDULLARY (  IM) NAIL INTERTROCHANTERIC Left 06/16/2016   Procedure: INTRAMEDULLARY (IM) NAIL INTERTROCHANTRIC;  Surgeon: Rod Can, MD;  Location: [facility];  Service: Orthopedics;  Laterality: Left;       Home Medications    Prior to Admission medications   Medication Sig Start Date End Date Taking? Authorizing Provider  amLODipine (NORVASC) 10 MG  tablet TAKE 1 TABLET ONCE DAILY. 07/20/17  Yes [name], [name]  aspirin 81 MG EC tablet Take 1 tablet (81 mg total) by mouth daily. 01/29/17  Yes [name], [name]  FLUoxetine (PROZAC) 40 MG capsule Take 1 capsule (40 mg total) by mouth daily. 01/30/17  Yes [name], [name]  insulin aspart (NOVOLOG) 100 UNIT/ML FlexPen Inject 3 Units into the skin 3 (three) times daily with meals. For blood sugars greater than 150 only 06/30/17  Yes [name], [name], DO  Insulin Glargine (LANTUS SOLOSTAR) 100 UNIT/ML Solostar Pen Inject 5 Units into the skin daily at 10 pm. 10/02/17  Yes [name], [name]  glucagon (GLUCAGON EMERGENCY) 1 MG injection Inject 1 mg into the vein once as needed. 06/30/17   [name], DO    Family History Family History  Problem Relation Age of Onset  . Diabetes Mother   . Alzheimer's disease Mother   . Hypertension Mother   . Hyperlipidemia Father   . Hypertension Father   . Healthy Maternal Grandmother   . Pneumonia Maternal Grandfather     Social History Social History   Tobacco Use  . Smoking status: Current Some Day Smoker    Packs/day: 0.25    Years: 30.00    Pack years: 7.50    Types: Cigarettes    Last attempt to quit: 01/31/2013    Years since quitting: 4.7  . Smokeless tobacco: Never Used  . Tobacco comment: "some days I light one", requests a nicotine patch  Substance Use Topics  . Alcohol use: No  . Drug use: No     Allergies   Patient has no known allergies.   Review of Systems Review of Systems  Constitutional: Positive for appetite change and fatigue.  HENT: Negative for congestion.   Respiratory: Negative for shortness of breath.   Cardiovascular: Negative for chest pain.  Gastrointestinal: Positive for abdominal pain.  Genitourinary: Negative for dysuria.  Musculoskeletal: Negative for back pain.  Neurological: Positive for light-headedness. Negative for syncope.  Hematological: Negative for  adenopathy.  Psychiatric/Behavioral: Negative for confusion.     Physical Exam Updated Vital Signs BP 136/71   Pulse 77   Temp 98.2 F (36.8 C) (Oral)   Resp 16   Wt 72.6 kg (160 lb)   SpO2 100%   BMI 25.82 kg/m   Physical Exam  Constitutional: He appears well-developed.  HENT:  Head: Atraumatic.  Eyes: Pupils are equal, round, and reactive to light.  Cardiovascular: Regular rhythm.  Pulmonary/Chest: Breath sounds normal.  Abdominal: Normal appearance. There is no tenderness.  Neurological: He is alert.  Skin: Skin is warm. Capillary refill takes less than 2 seconds.  Psychiatric: He has a normal mood and affect.     ED Treatments / Results  Labs (all labs ordered are listed, but only abnormal results are displayed) Labs Reviewed  CBC WITH DIFFERENTIAL/PLATELET - Abnormal; Notable for the following components:      Result Value   RBC 3.76 (*)    Hemoglobin 11.0 (*)    HCT 32.9 (*)    All other components within normal limits  COMPREHENSIVE  METABOLIC PANEL - Abnormal; Notable for the following components:   Sodium 127 (*)    Chloride 93 (*)    CO2 17 (*)    Glucose, Bld 584 (*)    BUN 72 (*)    Creatinine, Ser 1.92 (*)    Albumin 3.3 (*)    Alkaline Phosphatase 159 (*)    GFR calc non Af Amer 33 (*)    GFR calc Af Amer 39 (*)    Anion gap 17 (*)    All other components within normal limits  URINALYSIS, ROUTINE W REFLEX MICROSCOPIC - Abnormal; Notable for the following components:   Color, Urine STRAW (*)    Glucose, UA >=500 (*)    Ketones, ur 20 (*)    All other components within normal limits  CBG MONITORING, ED - Abnormal; Notable for the following components:   Glucose-Capillary 149 (*)    All other components within normal limits  CBG MONITORING, ED - Abnormal; Notable for the following components:   Glucose-Capillary >600 (*)    All other components within normal limits  LIPASE, BLOOD  TROPONIN I    EKG  EKG  Interpretation  Date/Time:  Monday November 16 2017 13:47:28 EST Ventricular Rate:  74 PR Interval:    QRS Duration: 91 QT Interval:  389 QTC Calculation: 432 R Axis:   -73 Text Interpretation:  Sinus rhythm Left anterior fascicular block Anterior infarct, old Confirmed by Davonna Belling [phone]) on 11/16/2017 7:37:49 PM       Radiology No results found.  Procedures Procedures (including critical care time)  Medications Ordered in ED Medications  0.9 %  sodium chloride infusion ( Intravenous New Bag/Given 11/16/17 2133)  sodium chloride 0.9 % bolus 500 mL (0 mLs Intravenous Stopped 11/16/17 2124)     Initial Impression / Assessment and Plan / ED Course  I have reviewed the triage vital signs and the nursing notes.  Pertinent labs & imaging results that were available during my care of the patient were reviewed by me and considered in my medical decision making (see chart for details).     Patient presents with dizziness and lightheaded after upper abdominal pain.  Has had some vomiting.  Appears to be in a DKA with anion gap and hyperglycemia.  Does have ketones in urine. History of same.  Will admit to hospitalist. Final Clinical Impressions(s) / ED Diagnoses   Final diagnoses:  Diabetic ketoacidosis without coma associated with type 2 diabetes mellitus [facility])    ED Discharge Orders    None       Davonna Belling, MD 11/16/17 2200

## 2017-11-16 NOTE — ED Notes (Signed)
In phlebotomy

## 2017-11-16 NOTE — ED Notes (Signed)
Pt had small episode of vomiting

## 2017-11-16 NOTE — ED Triage Notes (Signed)
Pt comes in with complaints of generalized weakness and dizziness over the past 45 minutes that has altered his ability to ambulate. Usually ambulates with walker. Also complains of generalized abdominal pain that began around same time. Denies nausea, vomiting, or diarrhea. Pt oriented to self, situation, and location but not time or president.

## 2017-11-16 NOTE — ED Notes (Signed)
2 failed attempts at IV insertion

## 2017-11-17 DIAGNOSIS — Z9114 Patient's other noncompliance with medication regimen: Secondary | ICD-10-CM | POA: Diagnosis not present

## 2017-11-17 DIAGNOSIS — D631 Anemia in chronic kidney disease: Secondary | ICD-10-CM | POA: Diagnosis present

## 2017-11-17 DIAGNOSIS — F329 Major depressive disorder, single episode, unspecified: Secondary | ICD-10-CM | POA: Diagnosis present

## 2017-11-17 DIAGNOSIS — Z681 Body mass index (BMI) 19 or less, adult: Secondary | ICD-10-CM | POA: Diagnosis not present

## 2017-11-17 DIAGNOSIS — Z8673 Personal history of transient ischemic attack (TIA), and cerebral infarction without residual deficits: Secondary | ICD-10-CM | POA: Diagnosis not present

## 2017-11-17 DIAGNOSIS — I129 Hypertensive chronic kidney disease with stage 1 through stage 4 chronic kidney disease, or unspecified chronic kidney disease: Secondary | ICD-10-CM | POA: Diagnosis present

## 2017-11-17 DIAGNOSIS — D649 Anemia, unspecified: Secondary | ICD-10-CM | POA: Diagnosis not present

## 2017-11-17 DIAGNOSIS — Z794 Long term (current) use of insulin: Secondary | ICD-10-CM | POA: Diagnosis not present

## 2017-11-17 DIAGNOSIS — N183 Chronic kidney disease, stage 3 (moderate): Secondary | ICD-10-CM | POA: Diagnosis not present

## 2017-11-17 DIAGNOSIS — R1010 Upper abdominal pain, unspecified: Secondary | ICD-10-CM | POA: Diagnosis not present

## 2017-11-17 DIAGNOSIS — E1121 Type 2 diabetes mellitus with diabetic nephropathy: Secondary | ICD-10-CM | POA: Diagnosis not present

## 2017-11-17 DIAGNOSIS — N179 Acute kidney failure, unspecified: Secondary | ICD-10-CM | POA: Diagnosis not present

## 2017-11-17 DIAGNOSIS — F17210 Nicotine dependence, cigarettes, uncomplicated: Secondary | ICD-10-CM | POA: Diagnosis present

## 2017-11-17 DIAGNOSIS — E1165 Type 2 diabetes mellitus with hyperglycemia: Secondary | ICD-10-CM | POA: Diagnosis not present

## 2017-11-17 DIAGNOSIS — R636 Underweight: Secondary | ICD-10-CM | POA: Diagnosis not present

## 2017-11-17 DIAGNOSIS — I10 Essential (primary) hypertension: Secondary | ICD-10-CM | POA: Diagnosis not present

## 2017-11-17 DIAGNOSIS — I444 Left anterior fascicular block: Secondary | ICD-10-CM | POA: Diagnosis present

## 2017-11-17 DIAGNOSIS — E860 Dehydration: Secondary | ICD-10-CM | POA: Diagnosis present

## 2017-11-17 DIAGNOSIS — E875 Hyperkalemia: Secondary | ICD-10-CM | POA: Diagnosis not present

## 2017-11-17 DIAGNOSIS — Z96641 Presence of right artificial hip joint: Secondary | ICD-10-CM | POA: Diagnosis present

## 2017-11-17 DIAGNOSIS — Z79899 Other long term (current) drug therapy: Secondary | ICD-10-CM | POA: Diagnosis not present

## 2017-11-17 DIAGNOSIS — E871 Hypo-osmolality and hyponatremia: Secondary | ICD-10-CM | POA: Diagnosis not present

## 2017-11-17 DIAGNOSIS — E1110 Type 2 diabetes mellitus with ketoacidosis without coma: Secondary | ICD-10-CM | POA: Diagnosis not present

## 2017-11-17 DIAGNOSIS — Z7982 Long term (current) use of aspirin: Secondary | ICD-10-CM | POA: Diagnosis not present

## 2017-11-17 DIAGNOSIS — E1122 Type 2 diabetes mellitus with diabetic chronic kidney disease: Secondary | ICD-10-CM | POA: Diagnosis present

## 2017-11-17 LAB — BASIC METABOLIC PANEL
Anion gap: 22 — ABNORMAL HIGH (ref 5–15)
Anion gap: 21 — ABNORMAL HIGH (ref 5–15)
Anion gap: 11 (ref 5–15)
Anion gap: 10 (ref 5–15)
BUN: 66 mg/dL — ABNORMAL HIGH (ref 6–20)
BUN: 64 mg/dL — ABNORMAL HIGH (ref 6–20)
BUN: 56 mg/dL — ABNORMAL HIGH (ref 6–20)
BUN: 50 mg/dL — ABNORMAL HIGH (ref 6–20)
CO2: 9 mmol/L — ABNORMAL LOW (ref 22–32)
CO2: 9 mmol/L — ABNORMAL LOW (ref 22–32)
CO2: 17 mmol/L — ABNORMAL LOW (ref 22–32)
CO2: 17 mmol/L — ABNORMAL LOW (ref 22–32)
Calcium: 7.7 mg/dL — ABNORMAL LOW (ref 8.9–10.3)
Calcium: 8.1 mg/dL — ABNORMAL LOW (ref 8.9–10.3)
Calcium: 8 mg/dL — ABNORMAL LOW (ref 8.9–10.3)
Calcium: 8 mg/dL — ABNORMAL LOW (ref 8.9–10.3)
Chloride: 97 mmol/L — ABNORMAL LOW (ref 101–111)
Chloride: 103 mmol/L (ref 101–111)
Chloride: 108 mmol/L (ref 101–111)
Chloride: 110 mmol/L (ref 101–111)
Creatinine, Ser: 2.04 mg/dL — ABNORMAL HIGH (ref 0.61–1.24)
Creatinine, Ser: 2.04 mg/dL — ABNORMAL HIGH (ref 0.61–1.24)
Creatinine, Ser: 1.6 mg/dL — ABNORMAL HIGH (ref 0.61–1.24)
Creatinine, Ser: 1.62 mg/dL — ABNORMAL HIGH (ref 0.61–1.24)
GFR calc Af Amer: 36 mL/min — ABNORMAL LOW (ref >60)
GFR calc Af Amer: 36 mL/min — ABNORMAL LOW (ref >60)
GFR calc Af Amer: 48 mL/min — ABNORMAL LOW (ref >60)
GFR calc Af Amer: 47 mL/min — ABNORMAL LOW (ref >60)
GFR calc non Af Amer: 31 mL/min — ABNORMAL LOW (ref >60)
GFR calc non Af Amer: 31 mL/min — ABNORMAL LOW (ref >60)
GFR calc non Af Amer: 41 mL/min — ABNORMAL LOW (ref >60)
GFR calc non Af Amer: 41 mL/min — ABNORMAL LOW (ref >60)
Glucose, Bld: 817 mg/dL (ref 65–99)
Glucose, Bld: 484 mg/dL — ABNORMAL HIGH (ref 65–99)
Glucose, Bld: 138 mg/dL — ABNORMAL HIGH (ref 65–99)
Glucose, Bld: 148 mg/dL — ABNORMAL HIGH (ref 65–99)
Potassium: 6.2 mmol/L — ABNORMAL HIGH (ref 3.5–5.1)
Potassium: 4.2 mmol/L (ref 3.5–5.1)
Potassium: 4.6 mmol/L (ref 3.5–5.1)
Potassium: 4.1 mmol/L (ref 3.5–5.1)
Sodium: 128 mmol/L — ABNORMAL LOW (ref 135–145)
Sodium: 133 mmol/L — ABNORMAL LOW (ref 135–145)
Sodium: 136 mmol/L (ref 135–145)
Sodium: 137 mmol/L (ref 135–145)

## 2017-11-17 LAB — GLUCOSE, CAPILLARY
Glucose-Capillary: 469 mg/dL — ABNORMAL HIGH (ref 65–99)
Glucose-Capillary: 393 mg/dL — ABNORMAL HIGH (ref 65–99)
Glucose-Capillary: 215 mg/dL — ABNORMAL HIGH (ref 65–99)
Glucose-Capillary: 134 mg/dL — ABNORMAL HIGH (ref 65–99)
Glucose-Capillary: 155 mg/dL — ABNORMAL HIGH (ref 65–99)
Glucose-Capillary: 174 mg/dL — ABNORMAL HIGH (ref 65–99)
Glucose-Capillary: 126 mg/dL — ABNORMAL HIGH (ref 65–99)
Glucose-Capillary: 190 mg/dL — ABNORMAL HIGH (ref 65–99)
Glucose-Capillary: 137 mg/dL — ABNORMAL HIGH (ref 65–99)
Glucose-Capillary: 162 mg/dL — ABNORMAL HIGH (ref 65–99)
Glucose-Capillary: 287 mg/dL — ABNORMAL HIGH (ref 65–99)
Glucose-Capillary: 307 mg/dL — ABNORMAL HIGH (ref 65–99)
Glucose-Capillary: 196 mg/dL — ABNORMAL HIGH (ref 65–99)
Glucose-Capillary: 323 mg/dL — ABNORMAL HIGH (ref 65–99)
Glucose-Capillary: 160 mg/dL — ABNORMAL HIGH (ref 65–99)
Glucose-Capillary: 130 mg/dL — ABNORMAL HIGH (ref 65–99)
Glucose-Capillary: 164 mg/dL — ABNORMAL HIGH (ref 65–99)
Glucose-Capillary: 144 mg/dL — ABNORMAL HIGH (ref 65–99)
Glucose-Capillary: 152 mg/dL — ABNORMAL HIGH (ref 65–99)
Glucose-Capillary: 131 mg/dL — ABNORMAL HIGH (ref 65–99)
Glucose-Capillary: 140 mg/dL — ABNORMAL HIGH (ref 65–99)
Glucose-Capillary: 136 mg/dL — ABNORMAL HIGH (ref 65–99)

## 2017-11-17 LAB — HEMOGLOBIN A1C
Hgb A1c MFr Bld: 12.2 % — ABNORMAL HIGH (ref 4.8–5.6)
Mean Plasma Glucose: 303.44 mg/dL

## 2017-11-17 LAB — CBG MONITORING, ED
Glucose-Capillary: 569 mg/dL (ref 65–99)
Glucose-Capillary: >600 mg/dL (ref 65–99)

## 2017-11-17 LAB — MAGNESIUM: Magnesium: 2.4 mg/dL (ref 1.7–2.4)

## 2017-11-17 LAB — MRSA PCR SCREENING: MRSA by PCR: NEGATIVE

## 2017-11-17 MED ORDER — SODIUM CHLORIDE 0.9 % IV SOLN
INTRAVENOUS | Status: DC
  Administered 2017-11-17 (×2): via INTRAVENOUS

## 2017-11-17 MED ORDER — FLUOXETINE HCL 20 MG PO CAPS
40.0000 mg | ORAL_CAPSULE | Freq: Every day | ORAL | Status: DC
  Administered 2017-11-17 – 2017-11-19 (×3): 40 mg via ORAL
  Filled 2017-11-17 (×3): qty 2

## 2017-11-17 MED ORDER — INSULIN REGULAR HUMAN 100 UNIT/ML IJ SOLN
INTRAMUSCULAR | Status: AC
  Administered 2017-11-17: 10.2 [IU]/h via INTRAVENOUS
  Administered 2017-11-17: 5.4 [IU]/h via INTRAVENOUS
  Filled 2017-11-17 (×2): qty 1

## 2017-11-17 MED ORDER — AMLODIPINE BESYLATE 10 MG PO TABS
10.0000 mg | ORAL_TABLET | Freq: Every day | ORAL | Status: DC
  Administered 2017-11-17 – 2017-11-19 (×3): 10 mg via ORAL
  Filled 2017-11-17 (×2): qty 2
  Filled 2017-11-17: qty 1

## 2017-11-17 MED ORDER — ENOXAPARIN SODIUM 30 MG/0.3ML ~~LOC~~ SOLN
30.0000 mg | SUBCUTANEOUS | Status: DC
  Administered 2017-11-17 – 2017-11-18 (×2): 30 mg via SUBCUTANEOUS
  Filled 2017-11-17 (×2): qty 0.3

## 2017-11-17 MED ORDER — ASPIRIN EC 81 MG PO TBEC
81.0000 mg | DELAYED_RELEASE_TABLET | Freq: Every day | ORAL | Status: DC
  Administered 2017-11-17 – 2017-11-19 (×3): 81 mg via ORAL
  Filled 2017-11-17 (×3): qty 1

## 2017-11-17 MED ORDER — DEXTROSE-NACL 5-0.45 % IV SOLN
INTRAVENOUS | Status: DC
  Administered 2017-11-17 (×2): via INTRAVENOUS

## 2017-11-17 MED ORDER — SODIUM CHLORIDE 0.9 % IV SOLN
INTRAVENOUS | Status: AC
  Administered 2017-11-17: 01:00:00 via INTRAVENOUS

## 2017-11-17 NOTE — Care Management Note (Signed)
Case Management Note  Patient Details  Name: [name] [name] MRN: [id number] Date of Birth: [birth year]  Subjective/Objective:                  abd pain and dka  Action/Plan: Date: November 17, 2017 [name], BSN, [place], [place] Chart and notes review for patient progress and needs. Will follow for case management and discharge needs. No cm or discharge needs present at time of this review. Next review date: [id number]  Expected Discharge Date:                  Expected Discharge Plan:  Home/Self Care  In-House Referral:     Discharge planning Services  CM Consult  Post Acute Care Choice:    Choice offered to:     DME Arranged:    DME Agency:     HH Arranged:    HH Agency:     Status of Service:  In process, will continue to follow  If discussed at Long Length of Stay Meetings, dates discussed:    Additional Comments:  Leeroy Cha, RN 11/17/2017, 8:51 AM

## 2017-11-17 NOTE — Progress Notes (Addendum)
Inpatient Diabetes Program Recommendations  AACE/ADA: New Consensus Statement on Inpatient Glycemic Control (2015)  Target Ranges:  Prepandial:   less than 140 mg/dL      Peak postprandial:   less than 180 mg/dL (1-2 hours)      Critically ill patients:  140 - 180 mg/dL   Lab Results  Component Value Date   GLUCAP 215 (H) 11/17/2017   HGBA1C 9.9 (H) 06/27/2017    Review of Glycemic Control Results for BLESSED, COTHAM (MRN [id number]) as of 11/17/2017 09:06  Ref. Range 11/17/2017 02:25 11/17/2017 03:23 11/17/2017 04:25 11/17/2017 05:45  Glucose-Capillary Latest Ref Range: 65 - 99 mg/dL 569 (HH) 469 (H) 393 (H) 215 (H)   Diabetes history: Type 2 DM Outpatient Diabetes medications: Novolog 3 Units TID for BS >150mg /dL, Lantus 5 Units QHS Current orders for Inpatient glycemic control: insulin drip per phase 1 DKA order set  Inpatient Diabetes Program Recommendations:    Patient has long history of multiple admissions related to DKA for DM; 4th in last 6 months.  Per previous notes, patient has had difficulty with self-administration of correct doses of insulin. Per last DC note, he was discharge to SNF, however, for this current admission RN reports he did not have enough insulin at home per a conversation with his sister by phone, of which patient denies this report. There has been multiple concerns for self-care and safety related to insulin. Will plan to touch base with patient today. Recommending A1C. When ready to transition off the insulin drip, give Lantus 5 Units (home dose) 2 hours prior to discontinuing the drip.  Thanks, Bronson Curb, MSN, RNC-OB Diabetes Coordinator [phone] (8a-5p)  Addendum: Spoke with patient regarding home regimen and it does not correlate with medications that were part of medication reconciliation. Per patient, " I take Humalin 70/30 5 units BID." Patient did not keep eye contact during conversation and had flat affect. Patient reports that obtaining  medications is not a problem per transportation or financial. He reports that he lives alone and gives all insulin by himself. Patient states he checks blood sugars twice/day; one in the AM and one in afternoon prior to dinner with a range of 80-150mg /dL. Confirmed that patient came from home, not SNF. However, based on different reports of medication administration and concerns from previous coordinators, discharging MDs previously and patient's family member it is apparent that patient needs help with management of disease .   Spoke with patient's RN, Hildred Alamin regarding POC. RN states that she has cared for this patient prior and there have been issues with consistent story. Came from home. RN states that patient lives with daughter who helps with budgeting money, transportation and obtaining medications. Patient told her that he was taking different amount of insulin by different method of administration; no consistency. She has provided care for this patient in prior admissions, and patient's family was sneaking food in for patient while on [place]. Presents concern for discharge from a safety standpoint.

## 2017-11-17 NOTE — Progress Notes (Signed)
[name] Salk, RN spoke with patient's sister via phone overnight. Sister noted concern regarding the patient's insulin supply at home. She stated that the pt had been out of insulin for several weeks. During AM assessment patient reported that he has been taking his blood sugars regularly at home and sugars have been in the 180s. He denied being out of insulin and indicated that he has been covering his sugars with ss. Although he was unable to express how much insulin he gives himself. He admitted that he has not been eating a carb modified diet at home and has "being eating wrong." Will provide education on diet and continue to monitor patient closely.

## 2017-11-17 NOTE — ED Notes (Signed)
ED TO INPATIENT HANDOFF REPORT  Name/Age/Gender [name] [name] 72 y.o. male  Code Status    Code Status Orders  (From admission, onward)        Start     Ordered   11/17/17 0005  Full code  Continuous     11/17/17 0006    Code Status History    Date Active Date Inactive Code Status Order ID Comments User Context   08/01/2017 17:58 08/13/2017 22:37 Full Code [id number]  Magdalen Spatz, NP ED   07/25/2017 13:59 07/28/2017 16:41 Full Code [id number]  Samella Parr, NP ED   06/27/2017 16:19 06/30/2017 20:06 Full Code [id number]  Samella Parr, NP Inpatient   05/03/2017 12:32 05/08/2017 18:55 Full Code [id number]  Benito Mccreedy, MD Inpatient   05/03/2017 10:45 05/03/2017 12:32 DNR [id number]  Benito Mccreedy, MD ED   03/22/2017 23:51 03/31/2017 15:46 DNR [id number]  Karmen Bongo, MD Inpatient   01/30/2017 03:02 02/01/2017 19:11 Full Code [id number]  Gardiner Barefoot, NP Inpatient   01/29/2017 22:22 01/30/2017 03:02 Full Code [id number]  Karmen Bongo, MD Inpatient   01/16/2017 22:09 01/20/2017 18:43 Full Code [id number]  Toy Baker, MD Inpatient   12/28/2016 17:12 12/30/2016 19:24 Full Code [id number]  Hosie Poisson, MD ED   12/14/2016 00:38 12/17/2016 21:10 Full Code [id number]  Vianne Bulls, MD ED   11/16/2016 11:17 11/20/2016 22:30 Full Code [id number]  Radene Gunning, NP ED   10/01/2016 18:28 10/07/2016 19:53 Full Code [id number]  Omar Person, NP ED   08/17/2016 14:38 08/19/2016 22:16 Full Code [id number]  Maren Reamer, MD ED   07/06/2016 01:30 07/07/2016 22:43 Full Code [id number]  Lily Kocher, MD Inpatient   06/15/2016 10:07 06/21/2016 19:21 Full Code [id number]  Tawni Millers, MD Inpatient   05/15/2016 21:35 05/17/2016 16:53 Full Code [id number]  Vianne Bulls, MD ED   05/09/2016 02:52 05/12/2016 17:16 Full Code [id number]  Roney Jaffe, MD Inpatient   11/01/2015 18:37 11/03/2015 17:08 Full Code [id number]  Robbie Lis, MD Inpatient   09/26/2015 00:12 09/27/2015 15:42  Full Code [id number]  Etta Quill, DO ED   08/21/2015 17:25 08/24/2015 21:20 Full Code [id number]  Domenic Polite, MD Inpatient   08/12/2015 16:37 08/14/2015 14:45 Full Code [id number]  Robbie Lis, MD ED   08/08/2015 00:37 08/10/2015 18:30 Full Code [id number]  Allyne Gee, MD ED   07/05/2015 09:21 07/09/2015 16:35 Full Code [id number]  Chesley Mires, MD ED   07/02/2015 18:36 07/04/2015 22:36 Full Code [id number]  Marshell Garfinkel, MD ED   03/20/2015 11:06 03/23/2015 15:50 Full Code [id number]  Allie Bossier, MD ED   03/20/2015 00:32 03/20/2015 11:06 Full Code [id number]  Lavina Hamman, MD ED   03/19/2014 21:57 03/22/2014 12:32 Full Code [id number]  Rise Patience, MD Inpatient   01/21/2014 15:06 01/25/2014 12:12 Full Code [id number]  Eugenie Filler, MD ED   07/03/2013 20:50 07/05/2013 16:09 Full Code [id number]  Theodis Blaze, MD ED   05/29/2013 22:49 05/31/2013 16:02 Full Code [id number]  Merton Border, MD Inpatient   02/05/2013 19:19 02/09/2013 17:40 Full Code [id number]  [name], MD Inpatient   02/02/2013 20:57 02/05/2013 19:19 Full Code [id number]  Erline Hau, MD ED      Home/SNF/Other Home  Chief Complaint Weakness; Unable to Walk  Level of Care/Admitting Diagnosis ED Disposition    ED Disposition Condition Comment   Admit  Hospital Area: [facility] [100102]  Level of Care: Stepdown [  14]  Admit to SDU based on following criteria: Severe physiological/psychological symptoms:  Any diagnosis requiring assessment & intervention at least every 4 hours on an ongoing basis to obtain desired patient outcomes including stability and rehabilitation  Diagnosis: DKA (diabetic ketoacidoses) [facility]) [017793]  Admitting Physician: Doreatha Massed  Attending Physician: Etta Quill [phone]  Estimated length of stay: past midnight tomorrow  Certification:: I certify this patient will need inpatient services for at least 2 midnights  PT Class (Do Not Modify):  Inpatient [101]  PT Acc Code (Do Not Modify): Private [1]       Medical History Past Medical History:  Diagnosis Date  . 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 Closed comminuted intertrochanteric fracture of left femur ([place])   . Diabetes mellitus without complication ([place])   . Diabetic hyperosmolar non-ketotic state ([place]) 05/15/2016  . DKA (diabetic ketoacidoses) ([place]) 05/09/2016  . Hypertension   . Postoperative anemia due to acute blood loss 06/18/2016  . Retroperitoneal hematoma 06/18/2016  . Stroke ([place])   . Vitamin B 12 deficiency 06/18/2016    Allergies No Known Allergies  IV Location/Drains/Wounds Patient Lines/Drains/Airways Status   Active Line/Drains/Airways    Name:   Placement date:   Placement time:   Site:   Days:   Peripheral IV 11/16/17 Left;Posterior Forearm   11/16/17    2015    Forearm   1   External Urinary Catheter   08/12/17    0658    -   97   Wound / Incision (Open or Dehisced) 05/03/17 Laceration Knee Right;Left   05/03/17    1200    Knee   198          Labs/Imaging Results for orders placed or performed during the hospital encounter of 11/16/17 (from the past 48 hour(s))  CBG monitoring, ED     Status: Abnormal   Collection Time: 11/16/17  1:29 PM  Result Value Ref Range   Glucose-Capillary 149 (H) 65 - 99 mg/dL  CBC with Differential/Platelet     Status: Abnormal   Collection Time: 11/16/17  8:15 PM  Result Value Ref Range   WBC 7.5 4.0 - 10.5 K/uL   RBC 3.76 (L) 4.22 - 5.81 MIL/uL   Hemoglobin 11.0 (L) 13.0 - 17.0 g/dL   HCT 32.9 (L) 39.0 - 52.0 %   MCV 87.5 78.0 - 100.0 fL   MCH 29.3 26.0 - 34.0 pg   MCHC 33.4 30.0 - 36.0 g/dL   RDW 13.7 11.5 - 15.5 %   Platelets 329 150 - 400 K/uL   Neutrophils Relative % 66 %   Neutro Abs 4.9 1.7 - 7.7 K/uL   Lymphocytes Relative 24 %   Lymphs Abs 1.8 0.7 - 4.0 K/uL   Monocytes Relative 6 %   Monocytes Absolute 0.5 0.1 - 1.0 K/uL   Eosinophils  Relative 4 %   Eosinophils Absolute 0.3 0.0 - 0.7 K/uL   Basophils Relative 0 %   Basophils Absolute 0.0 0.0 - 0.1 K/uL    Comment: Performed at [facility], [place] [address], [place], [place] [address]  Comprehensive metabolic panel     Status: Abnormal   Collection Time: 11/16/17  8:15 PM  Result Value Ref Range   Sodium 127 (L) 135 - 145 mmol/L   Potassium 5.1 3.5 - 5.1 mmol/L   Chloride 93 (L) 101 - 111 mmol/L   CO2 17 (L) 22 -  32 mmol/L   Glucose, Bld 584 (HH) 65 - 99 mg/dL    Comment: CRITICAL RESULT CALLED TO, READ BACK BY AND VERIFIED WITH: L ADKINS RN 2124 11/16/17 A NAVARRO    BUN 72 (H) 6 - 20 mg/dL   Creatinine, Ser 1.92 (H) 0.61 - 1.24 mg/dL   Calcium 9.0 8.9 - 10.3 mg/dL   Total Protein 7.6 6.5 - 8.1 g/dL   Albumin 3.3 (L) 3.5 - 5.0 g/dL   AST 34 15 - 41 U/L   ALT 30 17 - 63 U/L   Alkaline Phosphatase 159 (H) 38 - 126 U/L   Total Bilirubin 1.0 0.3 - 1.2 mg/dL   GFR calc non Af Amer 33 (L) >60 mL/min   GFR calc Af Amer 39 (L) >60 mL/min    Comment: (NOTE) The eGFR has been calculated using the CKD EPI equation. This calculation has not been validated in all clinical situations. eGFR's persistently <60 mL/min signify possible Chronic Kidney Disease.    Anion gap 17 (H) 5 - 15    Comment: Performed at [facility], [place] [address], [place], [place] [address]  Lipase, blood     Status: None   Collection Time: 11/16/17  8:15 PM  Result Value Ref Range   Lipase 47 11 - 51 U/L    Comment: Performed at [facility], [place] [address], [place], [place] [address]  Troponin I     Status: None   Collection Time: 11/16/17  8:15 PM  Result Value Ref Range   Troponin I <0.03 <0.03 ng/mL    Comment: Performed at [facility], [place] [address] [place], [place] [address]  Urinalysis, Routine w reflex microscopic     Status: Abnormal   Collection Time: 11/16/17  8:15 PM  Result Value Ref Range   Color,  Urine STRAW (A) YELLOW   APPearance CLEAR CLEAR   Specific Gravity, Urine 1.013 1.005 - 1.030   pH 6.0 5.0 - 8.0   Glucose, UA >=500 (A) NEGATIVE mg/dL   Hgb urine dipstick NEGATIVE NEGATIVE   Bilirubin Urine NEGATIVE NEGATIVE   Ketones, ur 20 (A) NEGATIVE mg/dL   Protein, ur NEGATIVE NEGATIVE mg/dL   Nitrite NEGATIVE NEGATIVE   Leukocytes, UA NEGATIVE NEGATIVE   RBC / HPF NONE SEEN 0 - 5 RBC/hpf   WBC, UA 0-5 0 - 5 WBC/hpf   Bacteria, UA NONE SEEN NONE SEEN   Squamous Epithelial / LPF NONE SEEN NONE SEEN   Mucus PRESENT     Comment: Performed at [facility], [place] [address] [place], [place] [address]  CBG monitoring, ED     Status: Abnormal   Collection Time: 11/16/17  9:31 PM  Result Value Ref Range   Glucose-Capillary >600 (HH) 65 - 99 mg/dL  CBG monitoring, ED     Status: Abnormal   Collection Time: 11/17/17 12:36 AM  Result Value Ref Range   Glucose-Capillary >600 (HH) 65 - 99 mg/dL   Comment 1 Notify RN    Comment 2 Document in Chart   Basic metabolic panel     Status: Abnormal   Collection Time: 11/17/17  1:04 AM  Result Value Ref Range   Sodium 128 (L) 135 - 145 mmol/L   Potassium 6.2 (H) 3.5 - 5.1 mmol/L    Comment: DELTA CHECK NOTED REPEATED TO VERIFY SLIGHT HEMOLYSIS    Chloride 97 (L) 101 - 111 mmol/L   CO2 9 (L) 22 - 32 mmol/L  Glucose, Bld 817 (HH) 65 - 99 mg/dL    Comment: CRITICAL RESULT CALLED TO, READ BACK BY AND VERIFIED WITH: H Audria Takeshita RN 0214 11/17/17 A NAVARRO    BUN 66 (H) 6 - 20 mg/dL   Creatinine, Ser 2.04 (H) 0.61 - 1.24 mg/dL   Calcium 7.7 (L) 8.9 - 10.3 mg/dL   GFR calc non Af Amer 31 (L) >60 mL/min   GFR calc Af Amer 36 (L) >60 mL/min    Comment: (NOTE) The eGFR has been calculated using the CKD EPI equation. This calculation has not been validated in all clinical situations. eGFR's persistently <60 mL/min signify possible Chronic Kidney Disease.    Anion gap 22 (H) 5 - 15    Comment: Performed at [facility], [place] [address] [place], [place] [address]  CBG monitoring, ED     Status: Abnormal   Collection Time: 11/17/17  2:25 AM  Result Value Ref Range   Glucose-Capillary 569 (HH) 65 - 99 mg/dL   No results found.  Pending Labs Unresulted Labs (From admission, onward)   Start     Ordered   11/17/17 8478  Basic metabolic panel  STAT Now then every 4 hours ,   STAT     11/17/17 0006      Vitals/Pain Today's Vitals   11/17/17 0042 11/17/17 0100 11/17/17 0130 11/17/17 0200  BP:  (!) 123/54 (!) 133/55 139/66  Pulse:  87 86 88  Resp:  18 (!) 24 (!) 24  Temp:      TempSrc:      SpO2:  100% 100% 100%  Weight:      Height: 5' 9.5" (1.765 m)     PainSc:        Isolation Precautions No active isolations  Medications Medications  0.9 %  sodium chloride infusion ( Intravenous New Bag/Given 11/16/17 2133)  0.9 %  sodium chloride infusion ( Intravenous Stopped 11/17/17 0218)  dextrose 5 %-0.45 % sodium chloride infusion ( Intravenous Hold 11/17/17 0229)  insulin regular (NOVOLIN R,HUMULIN R) 100 Units in sodium chloride 0.9 % 100 mL (1 Units/mL) infusion (10.2 Units/hr Intravenous New Bag/Given 11/17/17 0229)  enoxaparin (LOVENOX) injection 30 mg (not administered)  0.9 %  sodium chloride infusion ( Intravenous New Bag/Given 11/17/17 0103)  amLODipine (NORVASC) tablet 10 mg (not administered)  aspirin EC tablet 81 mg (not administered)  FLUoxetine (PROZAC) capsule 40 mg (not administered)  sodium chloride 0.9 % bolus 500 mL (0 mLs Intravenous Stopped 11/16/17 2124)    Mobility walks with device

## 2017-11-17 NOTE — Progress Notes (Signed)
Patient seen and evaluated, chart reviewed, please see EMR for updated orders. Please see full H&P dictated by admitting physician Dr. Alcario Drought for same date of service.    1)DKA -patient apparently ran out of insulin, had no transportation to go pick up his insulin, Pretty frequent history of same with 5 admissions in 2018 for DKA-  patient would need home health RN to improve medication compliance post discharge  Anion gap is down to 10 from 22, bicarb is up to 17 from 9, continue IV insulin drip per protocol  Diabetic education advised,  patient would need home health RN to improve medication compliance post dischargep   2) AKI-on CKD III -worsening renal function due to hyperglycemia induced polyuria/diuresis with dehydration , creatinine is down to 1.6 from 2.0 on admission, baseline creatinine around 1.4, baseline GFR around 50,  continue to hydrate, avoid nephrotoxic agent  Patient seen and evaluated, chart reviewed, please see EMR for updated orders. Please see full H&P dictated by admitting physician Dr. Alcario Drought for same date of service.

## 2017-11-17 NOTE — H&P (Addendum)
History and Physical    [name] [name] XTK:[id number] DOB: 1946/01/02 DOA: 11/16/2017  PCP: [name], [name]  Patient coming from: Home  I have personally briefly reviewed patient's old medical records in [place]  Chief Complaint: Abd pain, dizziness  HPI: [name] [name] is a 71 y.o. male with medical history significant of DM, DKA x 5 episodes in 2018.  Patient presents to the ED with c/o abd pain, dizziness.  Eating and had upper abd pain.  Began to feel light headed.  Sugars running slightly above 300 at home.   ED Course: After getting back to room, patient found to be in DKA with BGL in the 500s, bicarb 17, AG 17.  AKI with creat 1.9 up from 1.3 in October, BUN 72.   Review of Systems: As per HPI otherwise 10 point review of systems negative.   Past Medical History:  Diagnosis Date  . 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 Closed comminuted intertrochanteric fracture of left femur ([place])   . Diabetes mellitus without complication ([place])   . Diabetic hyperosmolar non-ketotic state ([place]) 05/15/2016  . DKA (diabetic ketoacidoses) ([place]) 05/09/2016  . Hypertension   . Postoperative anemia due to acute blood loss 06/18/2016  . Retroperitoneal hematoma 06/18/2016  . Stroke ([place])   . Vitamin B 12 deficiency 06/18/2016    Past Surgical History:  Procedure Laterality Date  . HIP ARTHROPLASTY Right 02/05/2013   Procedure: ARTHROPLASTY BIPOLAR HIP;  Surgeon: [name], MD;  Location: [facility];  Service: Orthopedics;  Laterality: Right;  . INTRAMEDULLARY (IM) NAIL INTERTROCHANTERIC Left 06/16/2016   Procedure: INTRAMEDULLARY (IM) NAIL INTERTROCHANTRIC;  Surgeon: Rod Can, MD;  Location: [facility];  Service: Orthopedics;  Laterality: Left;     reports that he has been smoking cigarettes.  He has a 7.50 pack-year smoking history. he has never used smokeless tobacco. He reports that he does not drink alcohol or use drugs.  No  Known Allergies  Family History  Problem Relation Age of Onset  . Diabetes Mother   . Alzheimer's disease Mother   . Hypertension Mother   . Hyperlipidemia Father   . Hypertension Father   . Healthy Maternal Grandmother   . Pneumonia Maternal Grandfather      Prior to Admission medications   Medication Sig Start Date End Date Taking? Authorizing Provider  amLODipine (NORVASC) 10 MG tablet TAKE 1 TABLET ONCE DAILY. 07/20/17  Yes [name], [name]  aspirin 81 MG EC tablet Take 1 tablet (81 mg total) by mouth daily. 01/29/17  Yes [name], [name]  FLUoxetine (PROZAC) 40 MG capsule Take 1 capsule (40 mg total) by mouth daily. 01/30/17  Yes [name], [name]  insulin aspart (NOVOLOG) 100 UNIT/ML FlexPen Inject 3 Units into the skin 3 (three) times daily with meals. For blood sugars greater than 150 only 06/30/17  Yes [name], [name], DO  Insulin Glargine (LANTUS SOLOSTAR) 100 UNIT/ML Solostar Pen Inject 5 Units into the skin daily at 10 pm. 10/02/17  Yes [name], [name]  glucagon (GLUCAGON EMERGENCY) 1 MG injection Inject 1 mg into the vein once as needed. 06/30/17   Raiford Noble [place], DO    Physical Exam: Vitals:   11/16/17 2300 11/16/17 2330 11/17/17 0000 11/17/17 0030  BP: (!) 126/58 (!) 110/58 (!) 126/57 (!) 104/53  Pulse: 88 85 91 89  Resp: 16 17    Temp:      TempSrc:  SpO2: 99% 100% 100% 99%  Weight:        Constitutional: NAD, calm, comfortable Eyes: PERRL, lids and conjunctivae normal ENMT: Mucous membranes are moist. Posterior pharynx clear of any exudate or lesions.Normal dentition.  Neck: normal, supple, no masses, no thyromegaly Respiratory: clear to auscultation bilaterally, no wheezing, no crackles. Normal respiratory effort. No accessory muscle use.  Cardiovascular: Regular rate and rhythm, no murmurs / rubs / gallops. No extremity edema. 2+ pedal pulses. No carotid bruits.  Abdomen: no tenderness, no masses palpated. No  hepatosplenomegaly. Bowel sounds positive.  Musculoskeletal: no clubbing / cyanosis. No joint deformity upper and lower extremities. Good ROM, no contractures. Normal muscle tone.  Skin: no rashes, lesions, ulcers. No induration Neurologic: CN 2-12 grossly intact. Sensation intact, DTR normal. Strength 5/5 in all 4.  Psychiatric: Normal judgment and insight. Alert and oriented x 3. Normal mood.    Labs on Admission: I have personally reviewed following labs and imaging studies  CBC: Recent Labs  Lab 11/16/17 2015  WBC 7.5  NEUTROABS 4.9  HGB 11.0*  HCT 32.9*  MCV 87.5  PLT 833   Basic Metabolic Panel: Recent Labs  Lab 11/16/17 2015  NA 127*  K 5.1  CL 93*  CO2 17*  GLUCOSE 584*  BUN 72*  CREATININE 1.92*  CALCIUM 9.0   GFR: Estimated Creatinine Clearance: 31.4 mL/min (A) (by C-G formula based on SCr of 1.92 mg/dL (H)). Liver Function Tests: Recent Labs  Lab 11/16/17 2015  AST 34  ALT 30  ALKPHOS 159*  BILITOT 1.0  PROT 7.6  ALBUMIN 3.3*   Recent Labs  Lab 11/16/17 2015  LIPASE 47   No results for input(s): AMMONIA in the last 168 hours. Coagulation Profile: No results for input(s): INR, PROTIME in the last 168 hours. Cardiac Enzymes: Recent Labs  Lab 11/16/17 2015  TROPONINI <0.03   BNP (last 3 results) No results for input(s): PROBNP in the last 8760 hours. HbA1C: No results for input(s): HGBA1C in the last 72 hours. CBG: Recent Labs  Lab 11/16/17 1329 11/16/17 2131 11/17/17 0036  GLUCAP 149* >600* >600*   Lipid Profile: No results for input(s): CHOL, HDL, LDLCALC, TRIG, CHOLHDL, LDLDIRECT in the last 72 hours. Thyroid Function Tests: No results for input(s): TSH, T4TOTAL, FREET4, T3FREE, THYROIDAB in the last 72 hours. Anemia Panel: No results for input(s): VITAMINB12, FOLATE, FERRITIN, TIBC, IRON, RETICCTPCT in the last 72 hours. Urine analysis:    Component Value Date/Time   COLORURINE STRAW (A) 11/16/2017 2015   APPEARANCEUR CLEAR  11/16/2017 2015   LABSPEC 1.013 11/16/2017 2015   PHURINE 6.0 11/16/2017 2015   GLUCOSEU >=500 (A) 11/16/2017 2015   HGBUR NEGATIVE 11/16/2017 2015   BILIRUBINUR NEGATIVE 11/16/2017 2015   KETONESUR 20 (A) 11/16/2017 2015   PROTEINUR NEGATIVE 11/16/2017 2015   UROBILINOGEN 0.2 05/12/2017 0939   NITRITE NEGATIVE 11/16/2017 2015   LEUKOCYTESUR NEGATIVE 11/16/2017 2015    Radiological Exams on Admission: No results found.  EKG: Independently reviewed.  Assessment/Plan Principal Problem:   DKA, type 2 ([name]) Active Problems:   Uncontrolled type 2 diabetes mellitus with diabetic nephropathy, with long-term current use of insulin ([name])   Acute renal failure superimposed on stage 3 chronic kidney disease ([name])   HTN (hypertension)    1. DKA - 1. DKA pathway 2. IVF: 2L bolus, 125 cc/hr NS then 75 cc/hr D5 half 3. Insulin gtt per pathway 4. BMP Q4H 2. HTN - continue home BP meds 3. AKI on CKD  stage 3 - 1. Pre-renal due to dehydration associated with DKA 2. IVF 3. Serial BMPs  DVT prophylaxis: Lovenox Code Status: Full Family Communication: No family in room Disposition Plan: Home after admit Consults called: None Admission status: Admit to inpatient - inpatient status for treatment of DKA.   Etta Quill DO [facility] Pager [phone]  If 7AM-7PM, please contact day team taking care of patient [email or web address] Password [email or web address]  11/17/2017, 12:40 AM

## 2017-11-18 DIAGNOSIS — Z794 Long term (current) use of insulin: Secondary | ICD-10-CM

## 2017-11-18 DIAGNOSIS — N183 Chronic kidney disease, stage 3 (moderate): Secondary | ICD-10-CM

## 2017-11-18 DIAGNOSIS — R636 Underweight: Secondary | ICD-10-CM | POA: Diagnosis present

## 2017-11-18 DIAGNOSIS — E1110 Type 2 diabetes mellitus with ketoacidosis without coma: Principal | ICD-10-CM

## 2017-11-18 DIAGNOSIS — Z9114 Patient's other noncompliance with medication regimen: Secondary | ICD-10-CM

## 2017-11-18 DIAGNOSIS — E1165 Type 2 diabetes mellitus with hyperglycemia: Secondary | ICD-10-CM

## 2017-11-18 DIAGNOSIS — E875 Hyperkalemia: Secondary | ICD-10-CM

## 2017-11-18 DIAGNOSIS — D649 Anemia, unspecified: Secondary | ICD-10-CM | POA: Diagnosis present

## 2017-11-18 DIAGNOSIS — I10 Essential (primary) hypertension: Secondary | ICD-10-CM

## 2017-11-18 DIAGNOSIS — E871 Hypo-osmolality and hyponatremia: Secondary | ICD-10-CM

## 2017-11-18 DIAGNOSIS — N179 Acute kidney failure, unspecified: Secondary | ICD-10-CM

## 2017-11-18 DIAGNOSIS — E1121 Type 2 diabetes mellitus with diabetic nephropathy: Secondary | ICD-10-CM

## 2017-11-18 LAB — GLUCOSE, CAPILLARY
Glucose-Capillary: 105 mg/dL — ABNORMAL HIGH (ref 65–99)
Glucose-Capillary: 119 mg/dL — ABNORMAL HIGH (ref 65–99)
Glucose-Capillary: 128 mg/dL — ABNORMAL HIGH (ref 65–99)
Glucose-Capillary: 129 mg/dL — ABNORMAL HIGH (ref 65–99)
Glucose-Capillary: 158 mg/dL — ABNORMAL HIGH (ref 65–99)
Glucose-Capillary: 161 mg/dL — ABNORMAL HIGH (ref 65–99)
Glucose-Capillary: 179 mg/dL — ABNORMAL HIGH (ref 65–99)
Glucose-Capillary: 186 mg/dL — ABNORMAL HIGH (ref 65–99)
Glucose-Capillary: 190 mg/dL — ABNORMAL HIGH (ref 65–99)
Glucose-Capillary: 192 mg/dL — ABNORMAL HIGH (ref 65–99)
Glucose-Capillary: 205 mg/dL — ABNORMAL HIGH (ref 65–99)
Glucose-Capillary: 212 mg/dL — ABNORMAL HIGH (ref 65–99)
Glucose-Capillary: 219 mg/dL — ABNORMAL HIGH (ref 65–99)

## 2017-11-18 LAB — CBC
HCT: 27.2 % — ABNORMAL LOW (ref 39.0–52.0)
Hemoglobin: 9.4 g/dL — ABNORMAL LOW (ref 13.0–17.0)
MCH: 29.7 pg (ref 26.0–34.0)
MCHC: 34.6 g/dL (ref 30.0–36.0)
MCV: 86.1 fL (ref 78.0–100.0)
Platelets: 278 10*3/uL (ref 150–400)
RBC: 3.16 MIL/uL — ABNORMAL LOW (ref 4.22–5.81)
RDW: 13.9 % (ref 11.5–15.5)
WBC: 7.8 10*3/uL (ref 4.0–10.5)

## 2017-11-18 LAB — BASIC METABOLIC PANEL
Anion gap: 8 (ref 5–15)
BUN: 38 mg/dL — ABNORMAL HIGH (ref 6–20)
CO2: 18 mmol/L — ABNORMAL LOW (ref 22–32)
Calcium: 8 mg/dL — ABNORMAL LOW (ref 8.9–10.3)
Chloride: 108 mmol/L (ref 101–111)
Creatinine, Ser: 1.45 mg/dL — ABNORMAL HIGH (ref 0.61–1.24)
GFR calc Af Amer: 54 mL/min — ABNORMAL LOW (ref >60)
GFR calc non Af Amer: 47 mL/min — ABNORMAL LOW (ref >60)
Glucose, Bld: 154 mg/dL — ABNORMAL HIGH (ref 65–99)
Potassium: 3.7 mmol/L (ref 3.5–5.1)
Sodium: 134 mmol/L — ABNORMAL LOW (ref 135–145)

## 2017-11-18 MED ORDER — ENOXAPARIN SODIUM 40 MG/0.4ML ~~LOC~~ SOLN
40.0000 mg | SUBCUTANEOUS | Status: DC
  Administered 2017-11-19: 40 mg via SUBCUTANEOUS
  Filled 2017-11-18: qty 0.4

## 2017-11-18 MED ORDER — INSULIN GLARGINE 100 UNIT/ML ~~LOC~~ SOLN
10.0000 [IU] | Freq: Every day | SUBCUTANEOUS | Status: DC
  Administered 2017-11-18 – 2017-11-19 (×2): 10 [IU] via SUBCUTANEOUS
  Filled 2017-11-18 (×3): qty 0.1

## 2017-11-18 MED ORDER — INSULIN ASPART 100 UNIT/ML ~~LOC~~ SOLN
0.0000 [IU] | Freq: Every day | SUBCUTANEOUS | Status: DC
  Administered 2017-11-18: 2 [IU] via SUBCUTANEOUS

## 2017-11-18 MED ORDER — INSULIN ASPART 100 UNIT/ML ~~LOC~~ SOLN
0.0000 [IU] | Freq: Three times a day (TID) | SUBCUTANEOUS | Status: DC
  Administered 2017-11-18: 1 [IU] via SUBCUTANEOUS
  Administered 2017-11-18: 2 [IU] via SUBCUTANEOUS
  Administered 2017-11-19 (×2): 1 [IU] via SUBCUTANEOUS

## 2017-11-18 MED ORDER — INSULIN ASPART 100 UNIT/ML ~~LOC~~ SOLN
3.0000 [IU] | Freq: Three times a day (TID) | SUBCUTANEOUS | Status: DC
  Administered 2017-11-18 – 2017-11-19 (×4): 3 [IU] via SUBCUTANEOUS

## 2017-11-18 NOTE — Progress Notes (Signed)
Report called to receiving Rn 5E03. Patient with no complaints at the current time. Insulin gtt d/c'd per order. Will transfer via [place].

## 2017-11-18 NOTE — Progress Notes (Signed)
PT Cancellation Note  Patient Details Name: [name] [name] MRN: [id number] DOB: 1946/08/12   Cancelled Treatment:    Reason Eval/Treat Not Completed: Patient declined, " Not this mornming". Just up to [facility]. RN encouraged patient also, still declined. Will check back another time.  Claretha Cooper 11/18/2017, 10:13 AM  Tresa Endo PT [phone]

## 2017-11-18 NOTE — Progress Notes (Addendum)
  Inpatient Diabetes Program Recommendations  AACE/ADA: New Consensus Statement on Inpatient Glycemic Control (2015)  Target Ranges:  Prepandial:   less than 140 mg/dL      Peak postprandial:   less than 180 mg/dL (1-2 hours)      Critically ill patients:  140 - 180 mg/dL   Lab Results  Component Value Date   GLUCAP 105 (H) 11/18/2017   HGBA1C 12.2 (H) 11/17/2017     Spoke with Dr Rockne Menghini regarding diabetes consult and concern for long term management of disease process, see coordinator note on 11/17/17. Admitted 4xs in last 6 months, with additional admits prior all related to poorly controlled diabetes. Informed MD of inconsistent statements made by the patient regarding home situation, frequency of blood sugar checks, and insulin administration to include: dosages, insulin type, and administration method (syringe vs. Pen). Expressed concerns for discharging patient home and safe administration of insulin medications. Plan for social work and case management to formulate plan.   Thanks, Bronson Curb, MSN, RNC-OB Diabetes Coordinator [phone] (8a-5p)

## 2017-11-18 NOTE — Progress Notes (Signed)
Progress Note    [name] [name]  AQT:[id number] DOB: 1946/08/29  DOA: 11/16/2017 PCP: [name], [name]    Brief Narrative:   Chief complaint: F/U abdominal pain/dizziness secondary to DKA  Medical records reviewed and are as summarized below:  [name] [name] is an 72 y.o. male with a PMH of diabetes and multiple episodes of DKA who was admitted 11/16/16 with a chief complaint of abdominal pain associated with dizziness.  Upon initial evaluation in the ED, found to be in DKA with blood glucoses in the 500s, bicarbonate level of 17, and an anion gap of 17.  He also was noted to have acute kidney injury with creatinine of 1.9 (baseline 1.3).  Assessment/Plan:   Principal Problem:   DKA, type 2 ([name]) Status post IV fluid bolus in the ER consisting of normal saline x2 liters, and ongoing vigorous hydration . Remains on insulin drip per glucose stabilizer protocol with every hour CBG checks. Transition to basal/bolus insulin.  IV fluids switched to D5 half-normal saline when serum glucose less than 250: D/C IV fluids containing dextrose. DM coordinator & dietician consultations requested.  CBG range 119-219.  Anion gap closed, 8 this morning.  Transfer to the floor.  Mobilize.  Active Problems:   Uncontrolled type 2 diabetes mellitus with diabetic nephropathy, with long-term current use of insulin ([place]) See principal problem.  Hemoglobin A1c is 12.2% indicating poor outpatient control.    Acute renal failure superimposed on stage 3 chronic kidney disease ([name])/hyperkalemia Creatinine 2.04---> 1.45 with vigorous hydration.  Hyperkalemia resolved.  Likely secondary to AKI.    HTN (hypertension) Continue amlodipine.  Blood pressure currently controlled.    Hyponatremia Improved with IV fluids and correction of elevated blood glucoses.    Normocytic anemia Drop in hemoglobin is likely dilutional in the setting of vigorous hydration.    Underweight Body mass index is 17.91  kg/m.   Family Communication/Anticipated D/C date and plan/Code Status   DVT prophylaxis: Lovenox ordered. Code Status: Full Code.  Family Communication: No family at the bedside. Disposition Plan: Home versus rehab depending on PT evaluation.  Likely stable for discharge 11/19/17.   Medical Consultants:    None.   Anti-Infectives:    None  Subjective:   [name] [name] feels well. Denies SOB, nausea, pain. Bowels moved normally last night. Trigger for DKA was running out of medication/insulin 2 days prior to admission.  Objective:    Vitals:   11/18/17 0000 11/18/17 0311 11/18/17 0400 11/18/17 0700  BP: (!) 118/52  131/66 130/65  Pulse: 67  65 69  Resp: (!) 22  17   Temp:  98.7 F (37.1 C)    TempSrc:  Oral    SpO2: 99%  100% 97%  Weight:      Height:        Intake/Output Summary (Last 24 hours) at 11/18/2017 0714 Last data filed at 11/18/2017 0600 Gross per 24 hour  Intake 584.74 ml  Output 1680 ml  Net -1095.26 ml   Filed Weights   11/16/17 1342 11/17/17 0300  Weight: 72.6 kg (160 lb) 55.8 kg (123 lb 0.3 oz)    Exam: General: No acute distress. Cardiovascular: Heart sounds show a regular rate, and rhythm. No gallops or rubs. No murmurs. No JVD. Lungs: Clear to auscultation bilaterally with good air movement. No rales, rhonchi or wheezes. Abdomen: Soft, nontender, nondistended with normal active bowel sounds. No masses. No hepatosplenomegaly. Neurological: Alert and oriented 2. Moves all extremities 4 with  equal strength. Cranial nerves II through XII grossly intact. Skin: Warm and dry. No rashes or lesions.  Skin is dry and scaly. Extremities: No clubbing or cyanosis. No edema. Pedal pulses 2+. Psychiatric: Mood and affect are normal. Insight and judgment are impaired  Data Reviewed:   I have personally reviewed following labs and imaging studies:  Labs: Labs show the following:   Basic Metabolic Panel: Recent Labs  Lab 11/17/17 0104 11/17/17 0335  11/17/17 0829 11/17/17 1245 11/18/17 0243  NA 128* 133* 136 137 134*  K 6.2* 4.2 4.6 4.1 3.7  CL 97* 103 108 110 108  CO2 9* 9* 17* 17* 18*  GLUCOSE 817* 484* 138* 148* 154*  BUN 66* 64* 56* 50* 38*  CREATININE 2.04* 2.04* 1.60* 1.62* 1.45*  CALCIUM 7.7* 8.1* 8.0* 8.0* 8.0*  MG  --   --  2.4  --   --    GFR Estimated Creatinine Clearance: 36.3 mL/min (A) (by C-G formula based on SCr of 1.45 mg/dL (H)). Liver Function Tests: Recent Labs  Lab 11/16/17 2015  AST 34  ALT 30  ALKPHOS 159*  BILITOT 1.0  PROT 7.6  ALBUMIN 3.3*   Recent Labs  Lab 11/16/17 2015  LIPASE 47   CBC: Recent Labs  Lab 11/16/17 2015 11/18/17 0243  WBC 7.5 7.8  NEUTROABS 4.9  --   HGB 11.0* 9.4*  HCT 32.9* 27.2*  MCV 87.5 86.1  PLT 329 278   Cardiac Enzymes: Recent Labs  Lab 11/16/17 2015  TROPONINI <0.03   CBG: Recent Labs  Lab 11/18/17 0230 11/18/17 0342 11/18/17 0434 11/18/17 0532 11/18/17 0634  GLUCAP 158* 119* 128* 219* 192*   Hgb A1c: Recent Labs    11/17/17 1419  HGBA1C 12.2*    Microbiology Recent Results (from the past 240 hour(s))  MRSA PCR Screening     Status: None   Collection Time: 11/17/17  3:20 AM  Result Value Ref Range Status   MRSA by PCR NEGATIVE NEGATIVE Final    Comment:        The GeneXpert MRSA Assay (FDA approved for NASAL specimens only), is one component of a comprehensive MRSA colonization surveillance program. It is not intended to diagnose MRSA infection nor to guide or monitor treatment for MRSA infections. Performed at [facility], [place] [address] [place], [place] [address]     Procedures and diagnostic studies:  No results found.  Medications:   . amLODipine  10 mg Oral Daily  . aspirin EC  81 mg Oral Daily  . enoxaparin (LOVENOX) injection  30 mg Subcutaneous Q24H  . FLUoxetine  40 mg Oral Daily   Continuous Infusions: . sodium chloride 20 mL/hr at 11/16/17 2133  . sodium chloride 150 mL/hr at  11/17/17 1429  . dextrose 5 % and 0.45% NaCl 75 mL/hr at 11/17/17 1630  . insulin (NOVOLIN-R) infusion 2.7 Units/hr (11/18/17 0645)     LOS: 1 day   Jacquelynn Cree  [facility] Pager [phone]. If unable to reach me by pager, please call my cell phone at [phone].  *Please refer to [email or web address], password [email or web address] to get updated schedule on who will round on this patient, as hospitalists switch teams weekly. If 7PM-7AM, please contact night-coverage at [email or web address], password [email or web address] for any overnight needs.  11/18/2017, 7:14 AM

## 2017-11-19 LAB — BASIC METABOLIC PANEL
Anion gap: 8 (ref 5–15)
BUN: 26 mg/dL — ABNORMAL HIGH (ref 6–20)
CO2: 21 mmol/L — ABNORMAL LOW (ref 22–32)
Calcium: 8.5 mg/dL — ABNORMAL LOW (ref 8.9–10.3)
Chloride: 106 mmol/L (ref 101–111)
Creatinine, Ser: 1.14 mg/dL (ref 0.61–1.24)
GFR calc Af Amer: >60 mL/min (ref >60)
GFR calc non Af Amer: >60 mL/min (ref >60)
Glucose, Bld: 208 mg/dL — ABNORMAL HIGH (ref 65–99)
Potassium: 4.3 mmol/L (ref 3.5–5.1)
Sodium: 135 mmol/L (ref 135–145)

## 2017-11-19 LAB — GLUCOSE, CAPILLARY
Glucose-Capillary: 407 mg/dL — ABNORMAL HIGH (ref 65–99)
Glucose-Capillary: 149 mg/dL — ABNORMAL HIGH (ref 65–99)
Glucose-Capillary: 142 mg/dL — ABNORMAL HIGH (ref 65–99)

## 2017-11-19 MED ORDER — INSULIN ASPART 100 UNIT/ML ~~LOC~~ SOLN
9.0000 [IU] | Freq: Once | SUBCUTANEOUS | Status: AC
  Administered 2017-11-19: 9 [IU] via SUBCUTANEOUS

## 2017-11-19 NOTE — Progress Notes (Signed)
PT Cancellation Note  Patient Details Name: [name] [name] MRN: [id number] DOB: [birth year]   Cancelled Treatment:    Reason Eval/Treat Not Completed: Patient declined, no reason specified   Weston Anna, MPT Pager: [phone]

## 2017-11-19 NOTE — Evaluation (Signed)
Occupational Therapy Evaluation Patient Details Name: [name] [name] MRN: [id number] DOB: 08-29-1946 Today's Date: 11/19/2017    History of Present Illness [name] [name] is an 72 y.o. male with a PMH of diabetes and multiple episodes of DKA who was admitted 11/16/16 with a chief complaint of abdominal pain associated with dizziness.  Upon initial evaluation in the ED, found to be in DKA with blood glucoses in the 500s, bicarbonate level of 17, and an anion gap of 17.  He also was noted to have acute kidney injury with creatinine of 1.9 (baseline 1.3).   Clinical Impression   Pt admitted with DKA. Pt currently with functional limitations due to the deficits listed below (see OT Problem List).  Pt will benefit from skilled OT to increase their safety and independence with ADL and functional mobility for ADL to facilitate discharge to venue listed below.      Follow Up Recommendations  No OT follow up;Supervision/Assistance - 24 hour    Equipment Recommendations  None recommended by OT    Recommendations for Other Services       Precautions / Restrictions        Mobility Bed Mobility Overal bed mobility: Independent                Transfers Overall transfer level: Needs assistance   Transfers: Sit to/from Stand;Stand Pivot Transfers Sit to Stand: Min guard Stand pivot transfers: Min guard       General transfer comment: VC for safety    Balance              impaired- refused formal testing                             ADL either performed or assessed with clinical judgement   ADL Overall ADL's : Needs assistance/impaired                                       General ADL Comments: Pt agreed to get OOB and over to chair for about 10 min.  Pt overall min A but did not want help from OT. Pt has a walker and does not use it. Pt reports he hasnt had a fall.  Pt with limited participation     Vision Baseline Vision/History: No visual  deficits Patient Visual Report: No change from baseline       Perception     Praxis      Pertinent Vitals/Pain Pain Assessment: No/denies pain Pain Score: 0-No pain     Hand Dominance     Extremity/Trunk Assessment Upper Extremity Assessment Upper Extremity Assessment: Generalized weakness           Communication     Cognition Arousal/Alertness: Awake/alert Behavior During Therapy: Agitated Overall Cognitive Status: Within Functional Limits for tasks assessed                                                Home Living Family/patient expects to be discharged to:: Private residence Living Arrangements: Children;Alone   Type of Home: Apartment Home Access: Elevator           Bathroom Shower/Tub: Occupational psychologist: Standard     Home Equipment: Environmental consultant -  2 wheels          Prior Functioning/Environment Level of Independence: Independent        Comments: has a walker but does not use it        OT Problem List: Decreased strength;Impaired balance (sitting and/or standing);Decreased knowledge of use of DME or AE;Decreased safety awareness      OT Treatment/Interventions: Self-care/ADL training;Patient/family education;DME and/or AE instruction    OT Goals(Current goals can be found in the care plan section) Acute Rehab OT Goals Patient Stated Goal: home and to be left alone OT Goal Formulation: With patient Time For Goal Achievement: 12/03/17 Potential to Achieve Goals: Good  OT Frequency: Min 2X/week   Barriers to D/C:               AM-PAC PT "6 Clicks" Daily Activity     Outcome Measure Help from another person eating meals?: None Help from another person taking care of personal grooming?: A Little Help from another person toileting, which includes using toliet, bedpan, or urinal?: A Little Help from another person bathing (including washing, rinsing, drying)?: A Little Help from another person to put on  and taking off regular upper body clothing?: None Help from another person to put on and taking off regular lower body clothing?: A Little 6 Click Score: 20   End of Session Nurse Communication: Mobility status  Activity Tolerance: Treatment limited secondary to agitation Patient left: in bed;with call bell/phone within reach  OT Visit Diagnosis: Unsteadiness on feet (R26.81)                Time: 7939-0300 OT Time Calculation (min): 10 min Charges:  OT General Charges $OT Visit: 1 Visit OT Evaluation $OT Eval Moderate Complexity: 1 Mod G-Codes:     Kari Baars, OT [phone]  Payton Mccallum D 11/19/2017, 11:52 AM

## 2017-11-19 NOTE — Progress Notes (Addendum)
[id number]/MTNZDK Davis,BSN,RN3,CCM: [phone]/MGEEATVV Kabat daughter left message for her to call back to this number concerning hhc for patient since she is the legal guardian. [id number]/tcf-Teri Pawelski/ if patient does not agree to hhc it is ok.

## 2017-11-19 NOTE — Progress Notes (Signed)
Spoke with pt  Concerning HH, Pt declined.

## 2017-11-19 NOTE — Progress Notes (Signed)
Inpatient Diabetes Program Recommendations  AACE/ADA: New Consensus Statement on Inpatient Glycemic Control (2015)  Target Ranges:  Prepandial:   less than 140 mg/dL      Peak postprandial:   less than 180 mg/dL (1-2 hours)      Critically ill patients:  140 - 180 mg/dL   Lab Results  Component Value Date   GLUCAP 407 (H) 11/19/2017   HGBA1C 12.2 (H) 11/17/2017    Review of Glycemic Control Results for [name] [name], [name] [name] (MRN [id number]) as of 11/19/2017 08:40  Ref. Range 11/18/2017 18:19 11/18/2017 21:09 11/19/2017 07:43  Glucose-Capillary Latest Ref Range: 65 - 99 mg/dL 190 (H) 205 (H) 407 (H)   Diabetes history: Type 2 DM Outpatient Diabetes medications: Novolog 3 Units TID for BS >150mg /dL, Lantus 5 Units QHS Current orders for Inpatient glycemic control: Novolog 0-5 Units QHS, Novolog 0-9 Units TID, Novolog 3 Units TIDAC, Lantus 10 Units QD    Inpatient Diabetes Program Recommendations:    Noted AM FSBS of 407 mg/dL, would recommend increasing basal to Lantus 15 Units QD and increasing meal coverage to Novolog 4 Units TIDAC.   Thanks, Bronson Curb, MSN, RNC-OB Diabetes Coordinator [phone] (8a-5p)

## 2017-11-19 NOTE — Discharge Summary (Signed)
Physician Discharge Summary  [name] [name] YYQ:[id number] DOB: [birth year] DOA: 11/16/2017  PCP: [name], [name]  Admit date: 11/16/2017 Discharge date: 11/19/2017  Admitted From: Home Discharge disposition: Home   Recommendations for Outpatient Follow-Up:   1. Needs a home safety evaluation. Home health SN and SW set up.   Discharge Diagnosis:   Principal Problem:   DKA, type 2 ([place]) Active Problems:   Uncontrolled type 2 diabetes mellitus with diabetic nephropathy, with long-term current use of insulin ([name])   Hyponatremia   Noncompliance with medication regimen   Acute renal failure superimposed on stage 3 chronic kidney disease ([name])   Hyperkalemia   Hypertension   HTN (hypertension)   Underweight   Normocytic anemia  Discharge Condition: Improved.  Diet recommendation: Low sodium, heart healthy.  Carbohydrate-modified.    History of Present Illness:   [name] [name] is an 72 y.o. male with a PMH of diabetes and multiple episodes of DKA who was admitted 11/16/16 with a chief complaint of abdominal pain associated with dizziness.  Upon initial evaluation in the ED, found to be in DKA with blood glucoses in the 500s, bicarbonate level of 17, and an anion gap of 17.  He also was noted to have acute kidney injury with creatinine of 1.9 (baseline 1.3).  Hospital Course by Problem:   Principal Problem:   DKA, type 2 ([place]) Status post IV fluid bolus in the ER consisting of normal saline x2 liters, and ongoing vigorous hydration . Insulin drip per glucose stabilizer protocol D/C'd 11/18/17 and the patient was transitioned to basal/bolus insulin. DM coordinator & dietician consultations performed, issue appears to be social/poor healthcare literacy.  CBG range 1105-407 (407 outlier).  Home health safety evaluation requested.  Active Problems:   Uncontrolled type 2 diabetes mellitus with diabetic nephropathy, with long-term current use of insulin ([place]) See principal problem.   Hemoglobin A1c is 12.2% indicating poor outpatient control. Needs close outpatient follow up.    Acute renal failure superimposed on stage 3 chronic kidney disease ([name])/hyperkalemia Creatinine 2.04---> 1.14 with vigorous hydration.  Hyperkalemia resolved.  Likely secondary to AKI.    HTN (hypertension) Continue amlodipine.  Blood pressure currently controlled.    Hyponatremia Resolved with IV fluids and correction of elevated blood glucoses.    Normocytic anemia Drop in hemoglobin is likely dilutional in the setting of vigorous hydration.    Underweight Body mass index is 17.91 kg/m.  Medical Consultants:    None.   Discharge Exam:   Vitals:   11/18/17 2111 11/19/17 0425  BP: 117/74 128/79  Pulse: 71 72  Resp: 18 20  Temp: 98.3 F (36.8 C) 98.8 F (37.1 C)  SpO2: 99% 98%   Vitals:   11/18/17 1200 11/18/17 1319 11/18/17 2111 11/19/17 0425  BP: 123/64 119/60 117/74 128/79  Pulse: 65 63 71 72  Resp: 19 18 18 20   Temp: 98.3 F (36.8 C) 97.6 F (36.4 C) 98.3 F (36.8 C) 98.8 F (37.1 C)  TempSrc: Oral Oral Oral Oral  SpO2: 97% 96% 99% 98%  Weight:      Height:        General exam: Appears calm and comfortable.  Respiratory system: Clear to auscultation. Respiratory effort normal. Cardiovascular system: S1 & S2 heard, RRR. No JVD,  rubs, gallops or clicks. No murmurs. Gastrointestinal system: Abdomen is nondistended, soft and nontender. No organomegaly or masses felt. Normal bowel sounds heard. Central nervous system: Alert and oriented x 2. No focal neurological deficits. Extremities: No  clubbing,  or cyanosis. No edema. Skin: No rashes, lesions or ulcers. Dry skin. Psychiatry: Judgement and insight appear impaired. Mood & affect appropriate.    The results of significant diagnostics from this hospitalization (including imaging, microbiology, ancillary and laboratory) are listed below for reference.     Procedures and Diagnostic Studies:   No  results found.   Labs:   Basic Metabolic Panel: Recent Labs  Lab 11/17/17 0335 11/17/17 0829 11/17/17 1245 11/18/17 0243 11/19/17 0618  NA 133* 136 137 134* 135  K 4.2 4.6 4.1 3.7 4.3  CL 103 108 110 108 106  CO2 9* 17* 17* 18* 21*  GLUCOSE 484* 138* 148* 154* 208*  BUN 64* 56* 50* 38* 26*  CREATININE 2.04* 1.60* 1.62* 1.45* 1.14  CALCIUM 8.1* 8.0* 8.0* 8.0* 8.5*  MG  --  2.4  --   --   --    GFR Estimated Creatinine Clearance: 46.2 mL/min (by C-G formula based on SCr of 1.14 mg/dL). Liver Function Tests: Recent Labs  Lab 11/16/17 2015  AST 34  ALT 30  ALKPHOS 159*  BILITOT 1.0  PROT 7.6  ALBUMIN 3.3*   Recent Labs  Lab 11/16/17 2015  LIPASE 47   CBC: Recent Labs  Lab 11/16/17 2015 11/18/17 0243  WBC 7.5 7.8  NEUTROABS 4.9  --   HGB 11.0* 9.4*  HCT 32.9* 27.2*  MCV 87.5 86.1  PLT 329 278   Cardiac Enzymes: Recent Labs  Lab 11/16/17 2015  TROPONINI <0.03   CBG: Recent Labs  Lab 11/18/17 1045 11/18/17 1203 11/18/17 1819 11/18/17 2109 11/19/17 0743  GLUCAP 161* 105* 190* 205* 407*    Hgb A1c Recent Labs    11/17/17 1419  HGBA1C 12.2*   Microbiology Recent Results (from the past 240 hour(s))  MRSA PCR Screening     Status: None   Collection Time: 11/17/17  3:20 AM  Result Value Ref Range Status   MRSA by PCR NEGATIVE NEGATIVE Final    Comment:        The GeneXpert MRSA Assay (FDA approved for NASAL specimens only), is one component of a comprehensive MRSA colonization surveillance program. It is not intended to diagnose MRSA infection nor to guide or monitor treatment for MRSA infections. Performed at [facility], [place] [address] [place], [place] [address]      Discharge Instructions:   Discharge Instructions    Call MD for:  difficulty breathing, headache or visual disturbances   Complete by:  As directed    Call MD for:  extreme fatigue   Complete by:  As directed    Call MD for:  persistant  nausea and vomiting   Complete by:  As directed    Diet Carb Modified   Complete by:  As directed    Increase activity slowly   Complete by:  As directed      Allergies as of 11/19/2017   No Known Allergies     Medication List    TAKE these medications   amLODipine 10 MG tablet Commonly known as:  NORVASC TAKE 1 TABLET ONCE DAILY.   aspirin 81 MG EC tablet Take 1 tablet (81 mg total) by mouth daily.   FLUoxetine 40 MG capsule Commonly known as:  PROZAC Take 1 capsule (40 mg total) by mouth daily.   glucagon 1 MG injection Commonly known as:  GLUCAGON EMERGENCY Inject 1 mg into the vein once as needed.   insulin aspart 100 UNIT/ML FlexPen Commonly known  as:  NOVOLOG Inject 3 Units into the skin 3 (three) times daily with meals. For blood sugars greater than 150 only   Insulin Glargine 100 UNIT/ML Solostar Pen Commonly known as:  LANTUS SOLOSTAR Inject 5 Units into the skin daily at 10 pm.      Follow-up Information    [name], [name]. Schedule an appointment as soon as possible for a visit in 1 week(s).   Specialty:  Family Medicine Why:  Hospital follow up. Contact information: [place] [address] [phone]            Time coordinating discharge: 35 minutes.  SignedMargreta Journey Orvel Cutsforth  Pager [phone] [facility] 11/19/2017, 9:40 AM

## 2017-12-08 IMAGING — CT CT CERVICAL SPINE W/O CM
4 of 8 series · 13 of 33 positions shown, 14 images · non-contrast
Comparison: 08/17/2016

CLINICAL DATA: Fall. Found on floor unresponsive. Initial
encounter.

EXAM:
CT HEAD WITHOUT CONTRAST
CT CERVICAL SPINE WITHOUT CONTRAST
TECHNIQUE: Multidetector CT imaging of the head and cervical spine was
performed following the standard protocol without intravenous
contrast. Multiplanar CT image reconstructions of the cervical spine
were also generated.

[Series 203: coronal st, idose (1) · coronal · 0.40mm/px · 2 of 82 slices shown]
[im 28/82  bone]
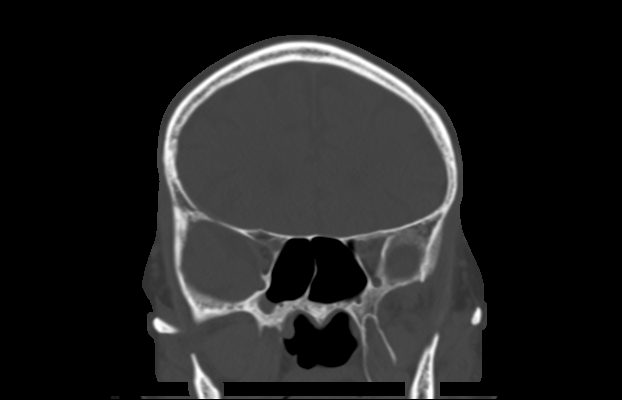
[im 55/82  bone]
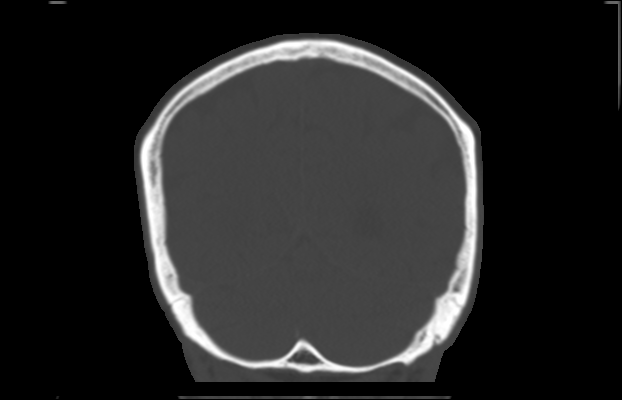

[Series 302: soft tissue, idose (2) · axial · 0.39mm/px · z∈[+155,+255]mm · 3 of 102 slices shown]
[im 26/102  soft-tissue]
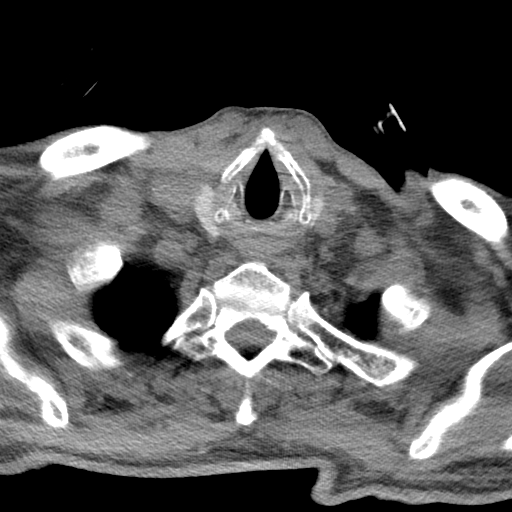
[im 51/102  soft-tissue]
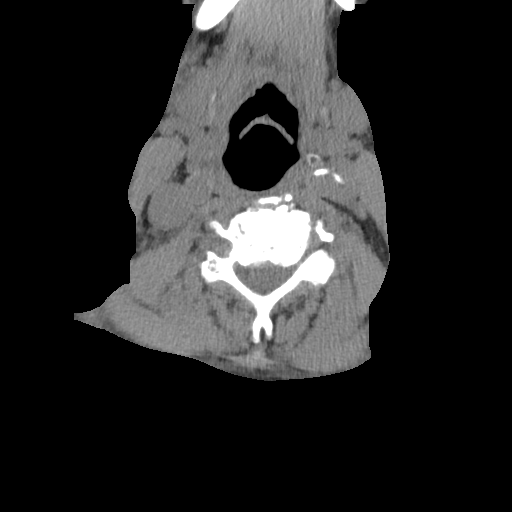
[im 76/102  soft-tissue]
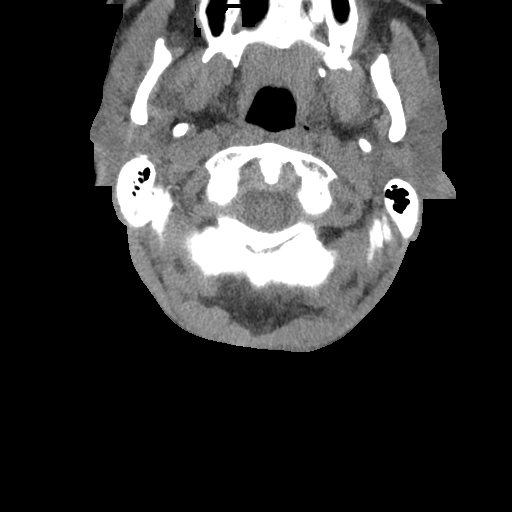

[Series 305: sagittal, idose (2) · sagittal · 0.34mm/px · 5 of 100 slices shown]
[im 17/100  bone]
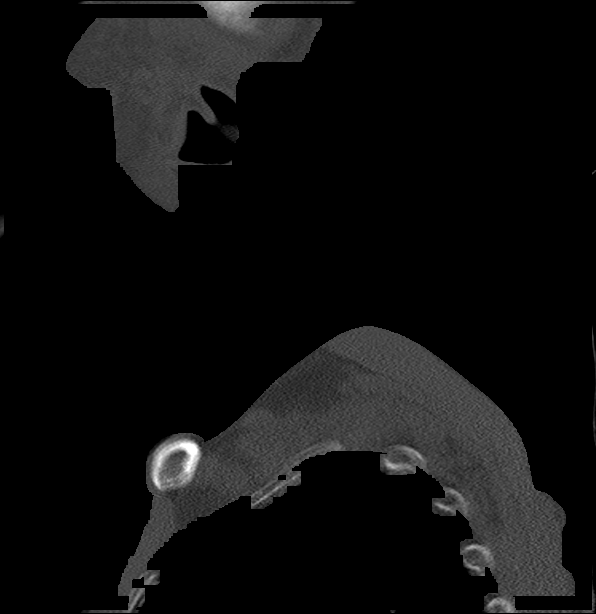
[im 34/100  bone]
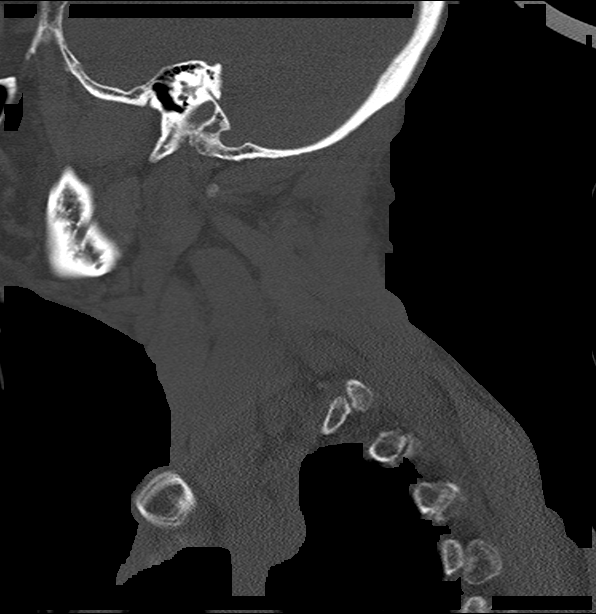
[im 50/100  bone]
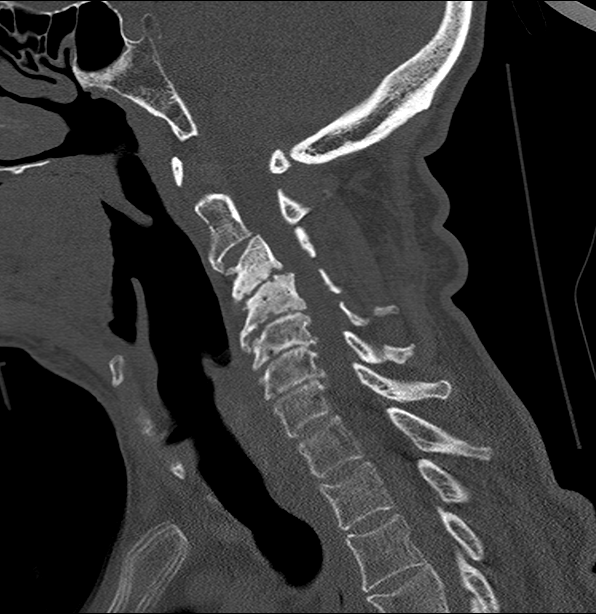
[im 67/100  bone]
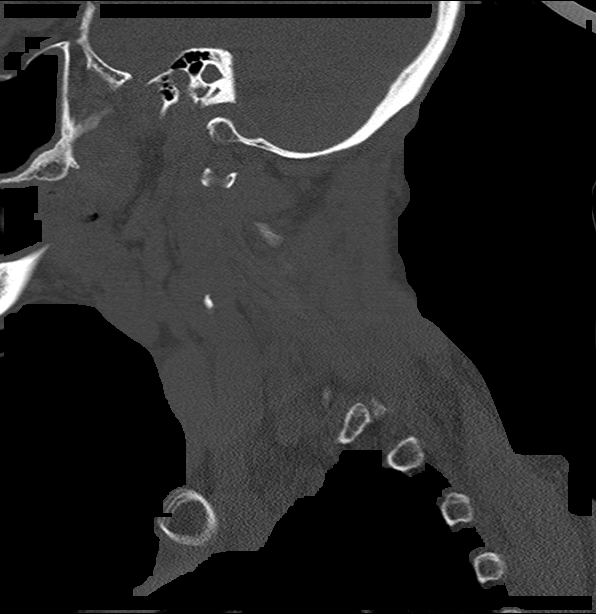
[im 83/100  bone]
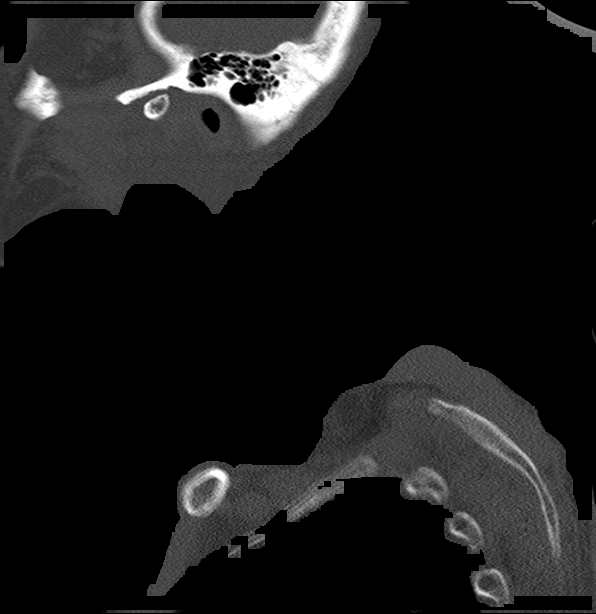

[Series 306: orthogonals, idose (2) · axial · 0.53mm/px · z∈[+115,+204]mm · 3 of 102 slices shown, 4 images]
[im 26/102  soft-tissue]
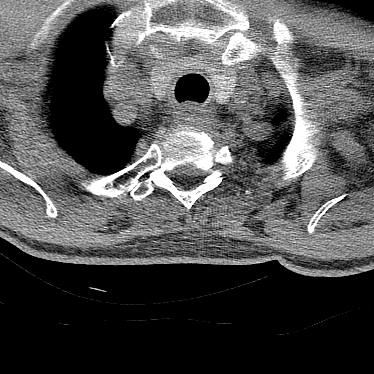
[im 26/102  bone]
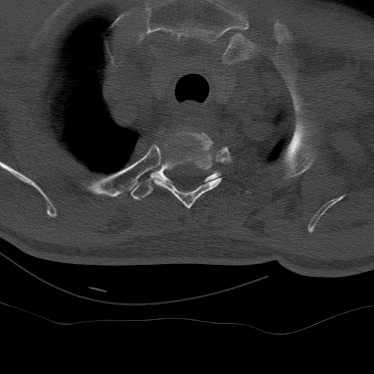
[im 51/102  bone]
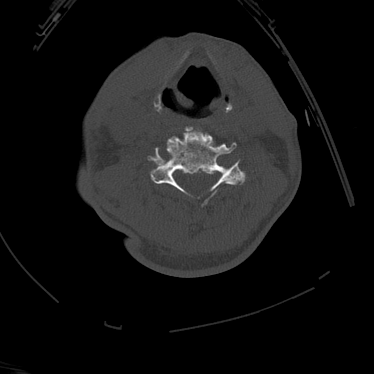
[im 76/102  bone]
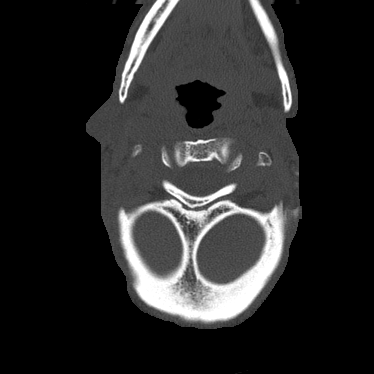

[13 of 33 positions shown; findings below may reference images not displayed]

FINDINGS: CT HEAD FINDINGS

Brain: No evidence of acute infarction, hemorrhage, hydrocephalus,
extra-axial collection or mass lesion/mass effect.

Vascular: Atherosclerotic calcification.

Skull: Negative for fracture

Sinuses/Orbits: No acute finding. Mild secretions layering the
sinuses. Left maxillary sinusitis is improved from comparison. Nasal
septal perforation.

CT CERVICAL SPINE FINDINGS

Alignment: Reversal of cervical lordosis from degenerative disease.
No traumatic malalignment when compared to prior.

Skull base and vertebrae: Negative for acute fracture. T2 and T4
superior endplate concavities are chronic.

Soft tissues and spinal canal: No gross canal hematoma or
prevertebral edema.

Disc levels: Advanced disc degeneration with narrowing, spurring,
and sclerosis from C3-4 to C6-7. Multilevel facet arthropathy.

Upper chest: No acute finding.
IMPRESSION: 1. No evidence of acute intracranial or cervical spine injury.
2. Stable exam compared to prior.  Incidental findings noted above.

## 2017-12-08 IMAGING — CR DG PORTABLE PELVIS
2 series · 2 of 2 positions shown · non-contrast
Comparison: 06/16/2016

CLINICAL DATA: Pain after fall. Initial encounter.

EXAM:
PORTABLE PELVIS 1-2 VIEWS

[AP (1 of 2)]
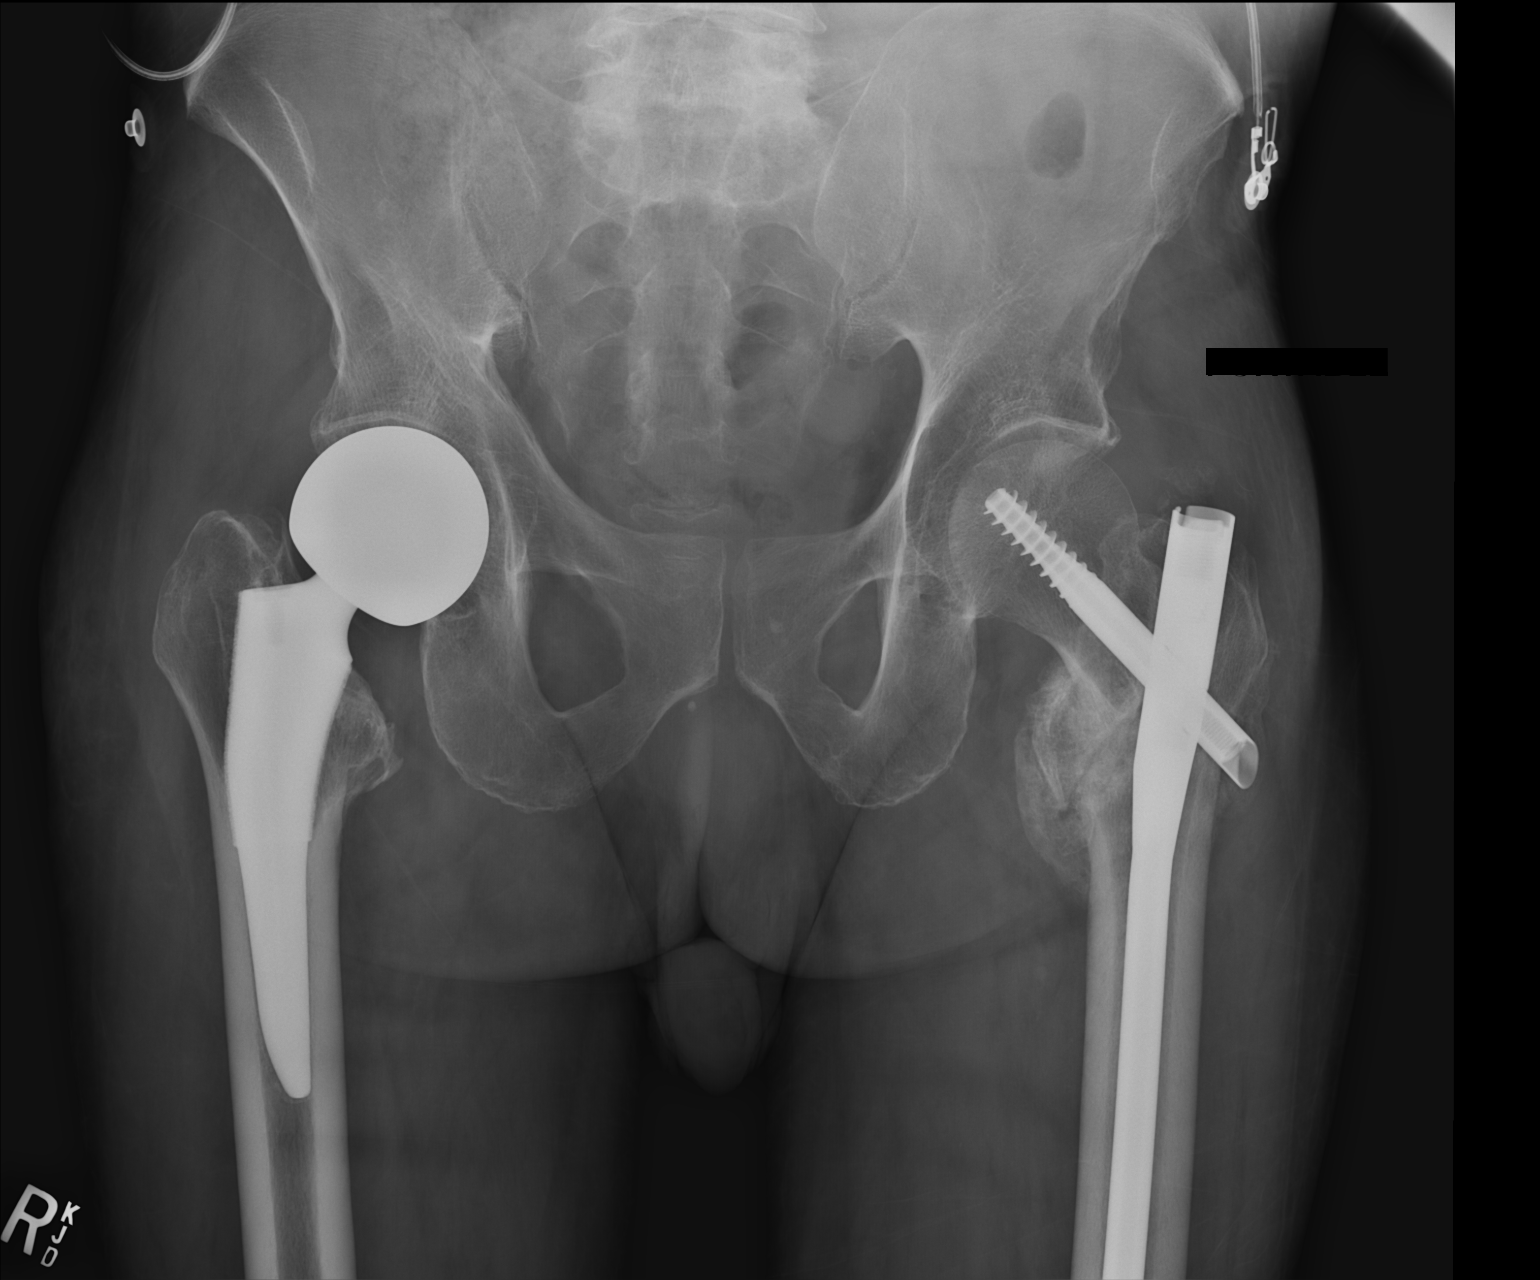

[AP (2 of 2)]
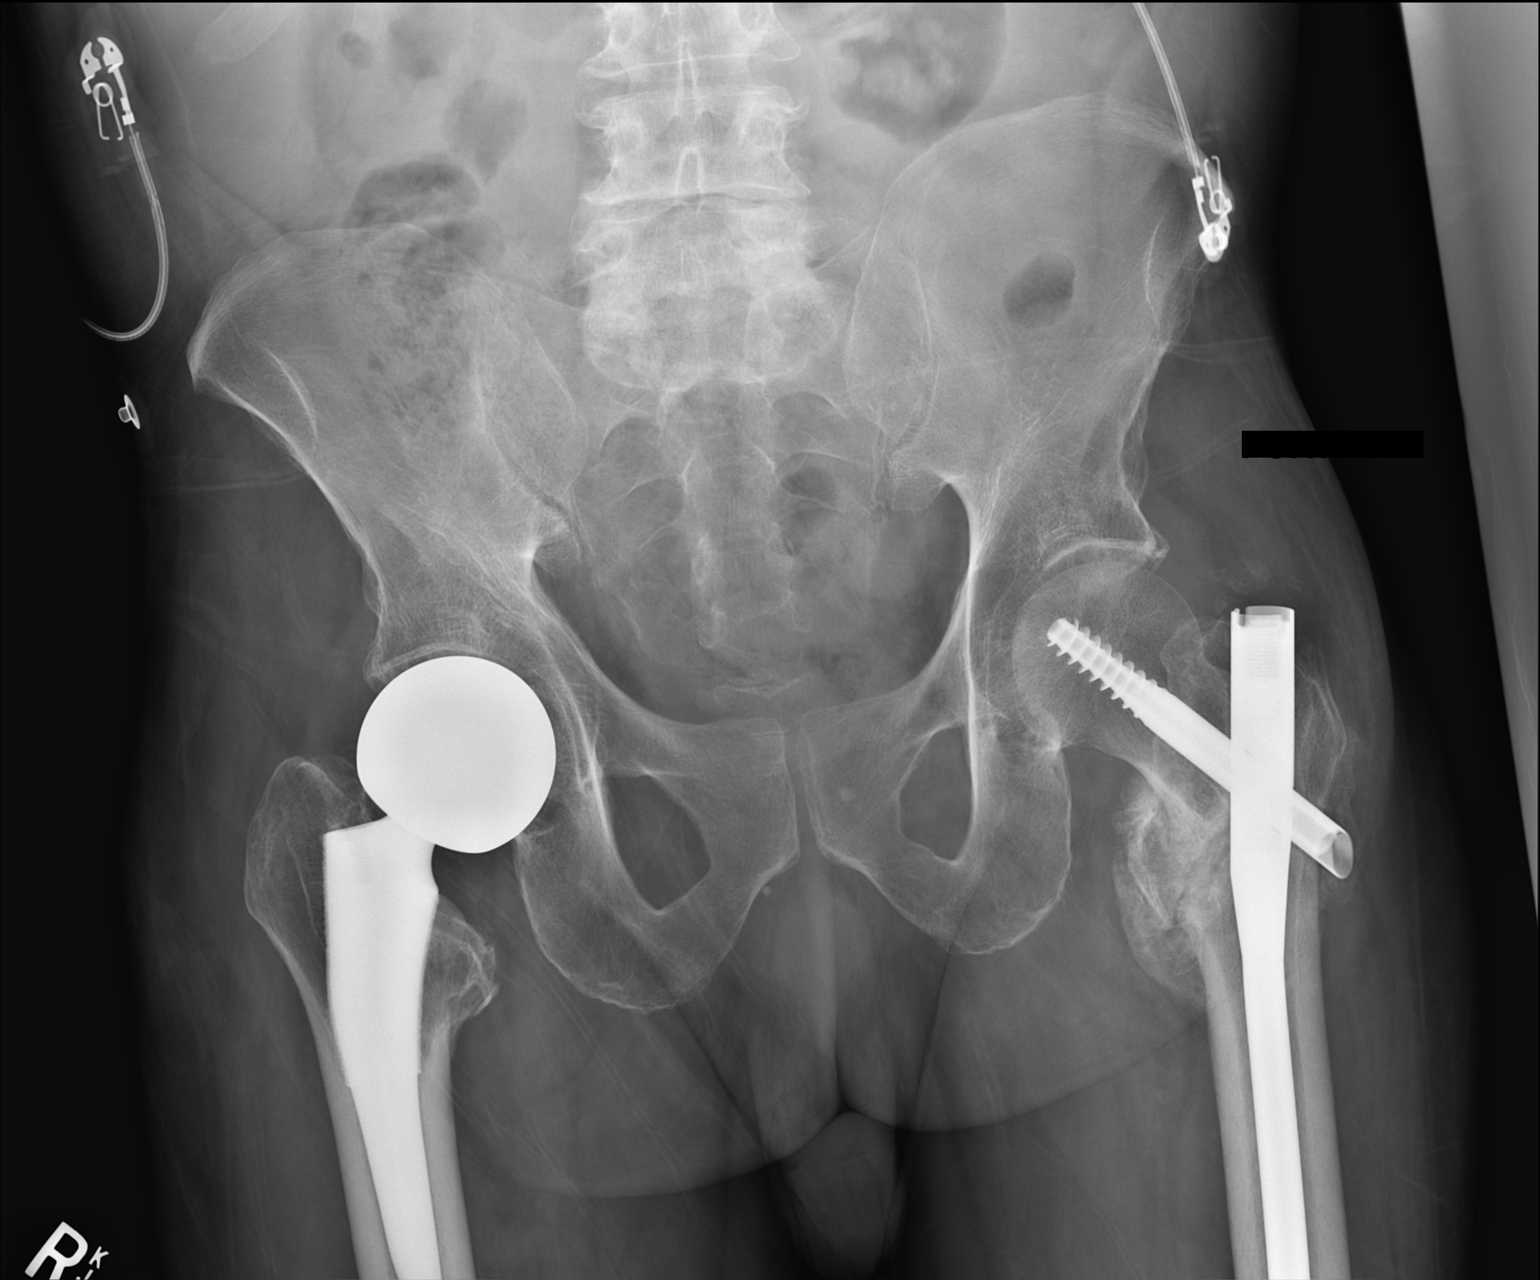

[2 of 2 positions shown; findings below may reference images not displayed]

FINDINGS: Status post ORIF of intertrochanteric left femur fracture.
Hypertrophic healing has developed along the lower fracture margin
when compared to prior. Dynamic hip screw has settled compared to
prior, expected. Bipolar right hip hemiarthroplasty appears located
and intact in the frontal projection. No visible pelvic ring
fracture or diastasis. Lower lumbar disc degeneration.
IMPRESSION: 1. No acute finding.
2. Right hip hemiarthroplasty and left femur intertrochanteric
fracture ORIF. The left femur fracture shows progressive healing
since 06/16/2016 comparison. If hip pain, two-view hip radiographs
would increase sensitivity.

## 2017-12-09 IMAGING — CR DG CHEST 1V PORT
1 series · 1 of 1 positions shown · non-contrast
Comparison: Prior radiograph from 07/05/2016.

CLINICAL DATA: Initial evaluation for central line placement.

EXAM:
PORTABLE CHEST 1 VIEW

[AP]
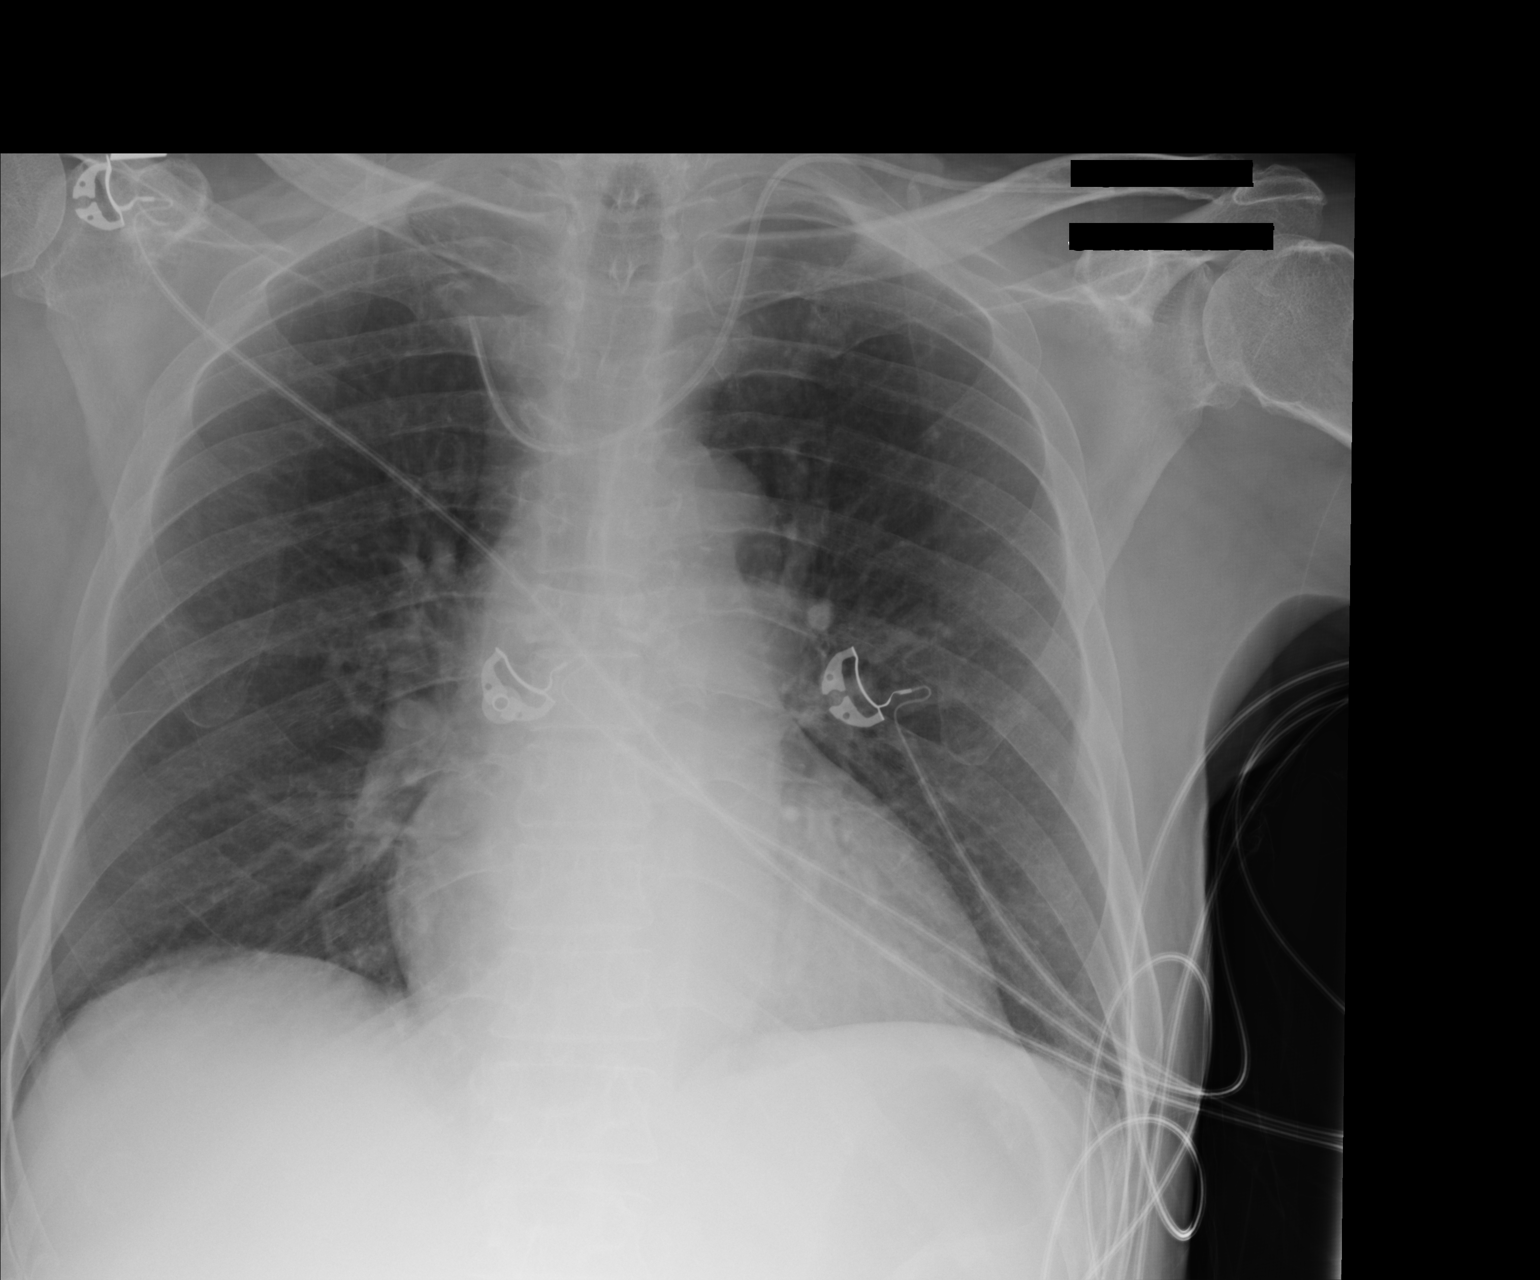

[1 of 1 positions shown; findings below may reference images not displayed]

FINDINGS: There has been interval placement of a left IJ approach centra
venous catheter. Catheter seen coursing across the expected location
of the left brachiocephalic vein and in cephalad into what is
probably the superior SVC. Repositioning recommended.

Cardiac and mediastinal silhouettes are stable, and remain within
normal limits.

Lungs are normally inflated. No focal infiltrates. No pulmonary
edema or pleural effusion. No pneumothorax.

No acute osseous abnormality.
IMPRESSION: 1. Malpositioned left IJ approach central venous catheter with tip
projecting superiorly, likely in the proximal SVC. Repositioning
recommended.
2. Otherwise stable appearance of the chest with no active
cardiopulmonary disease identified.
These results will be called to the ordering clinician or
representative by the Radiologist Assistant, and communication
documented in the PACS or zVision Dashboard.

## 2018-01-04 ENCOUNTER — Other Ambulatory Visit: Payer: Self-pay | Admitting: Family Medicine

## 2018-01-23 IMAGING — CR DG CHEST 1V PORT
1 series · 1 of 1 positions shown · non-contrast
Comparison: 10/02/2016

CLINICAL DATA: Generalized weakness and rapid respiratory rate.

EXAM:
PORTABLE CHEST 1 VIEW

[AP]
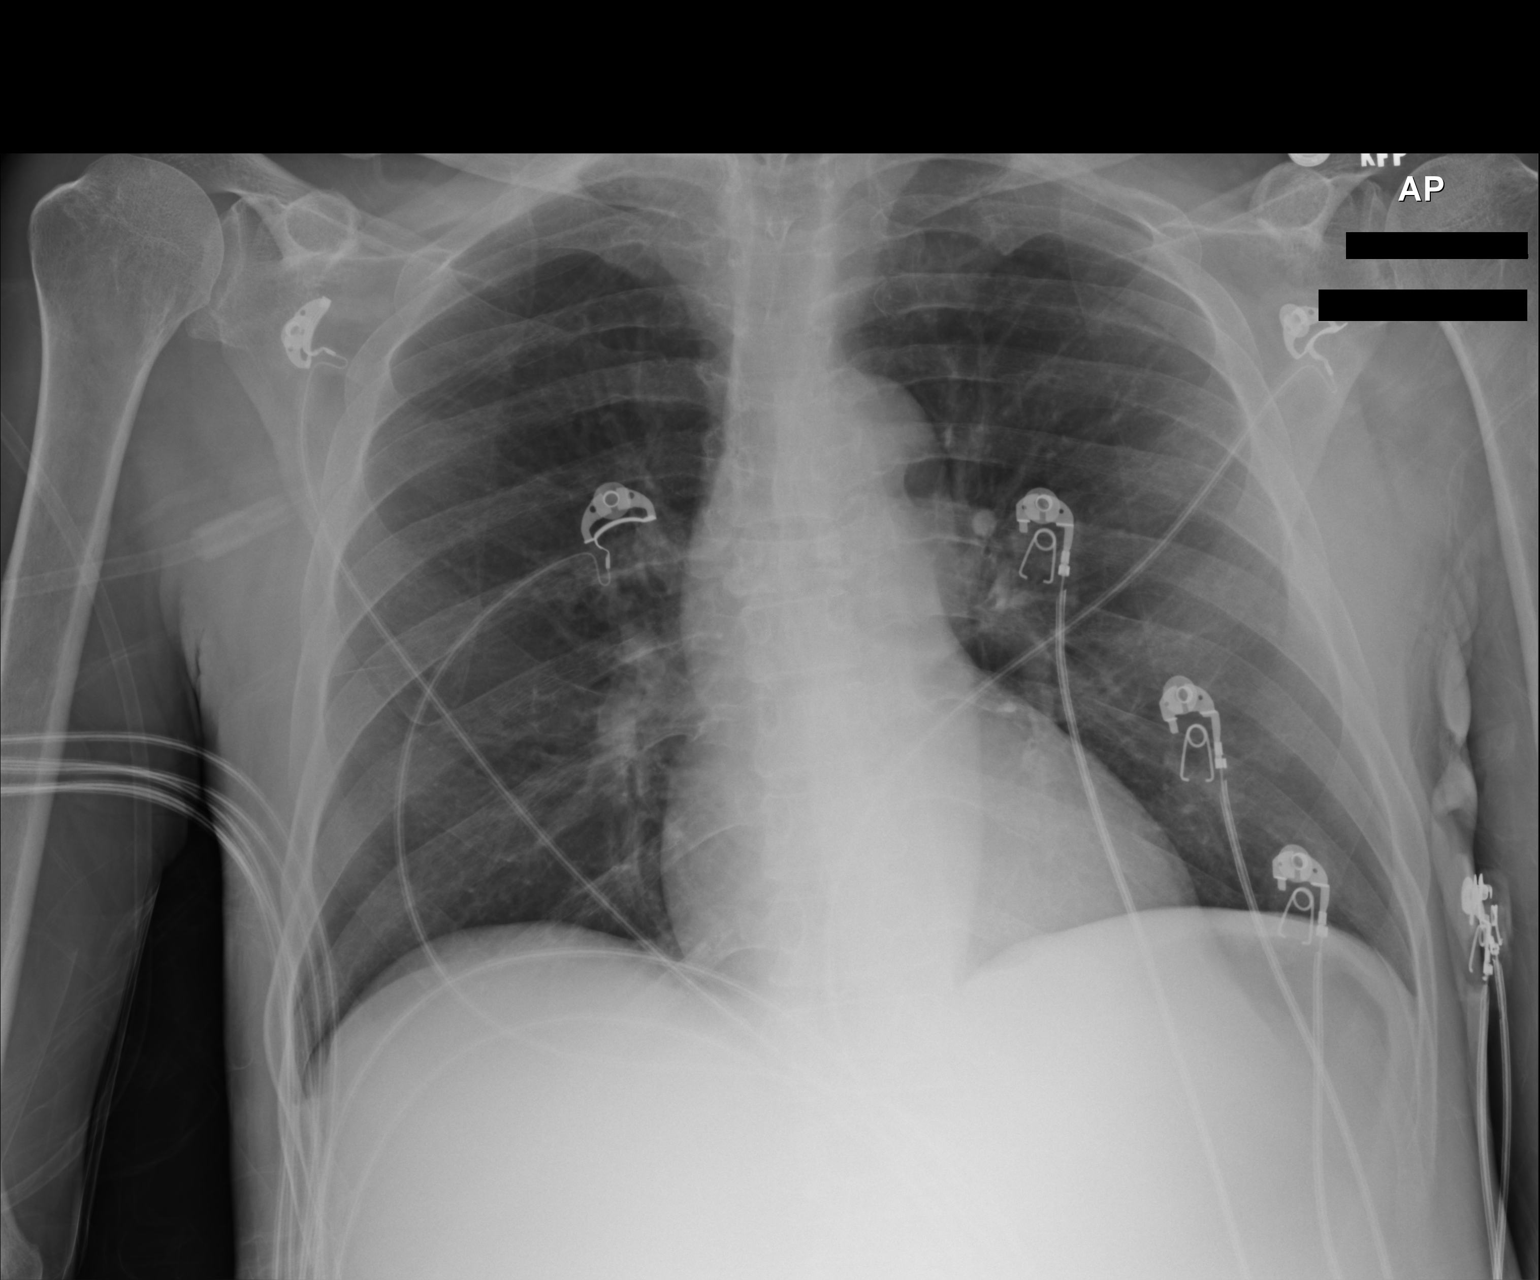

[1 of 1 positions shown; findings below may reference images not displayed]

FINDINGS: The cardiac silhouette, mediastinal and hilar contours are within
normal limits and stable. There is mild tortuosity of the thoracic
aorta. The lungs are clear. No pleural effusion. The bony thorax is
intact.
IMPRESSION: No acute cardiopulmonary findings.

## 2018-02-01 ENCOUNTER — Other Ambulatory Visit: Payer: Self-pay | Admitting: Family Medicine

## 2018-02-19 IMAGING — CT CT HEAD W/O CM
4 series · 16 of 47 positions shown, 18 images · non-contrast
Comparison: CT of the head performed 10/01/2016

CLINICAL DATA: Possible seizure, acute onset. Patient found
unresponsive. Initial encounter.

EXAM:
CT HEAD WITHOUT CONTRAST
TECHNIQUE: Contiguous axial images were obtained from the base of the skull
through the vertex without intravenous contrast.

[Series 2: head without · axial · non-contrast · 0.42mm/px · z∈[+1117,+1237]mm · 7 of 34 slices shown, 9 images]
[im 5/34  brain]
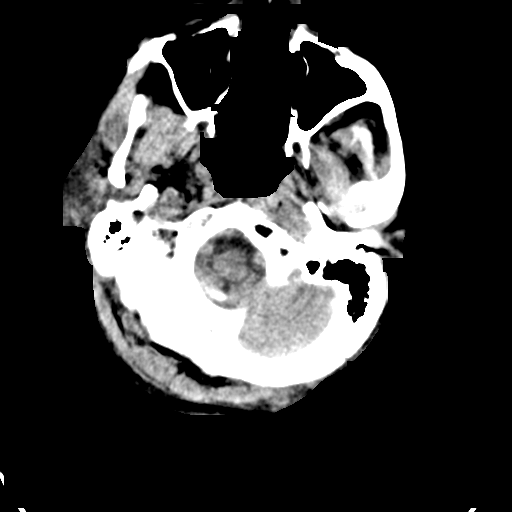
[im 5/34  bone]
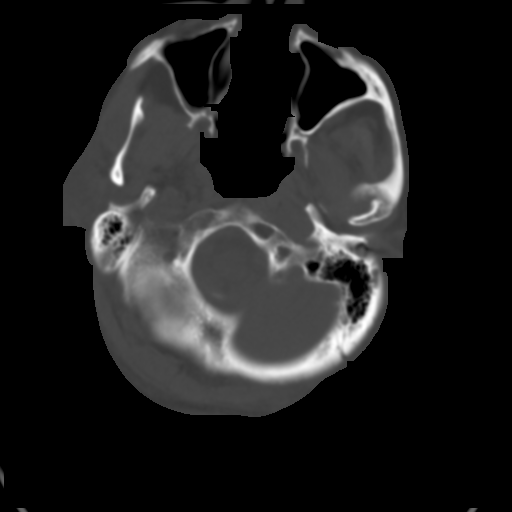
[im 9/34  brain]
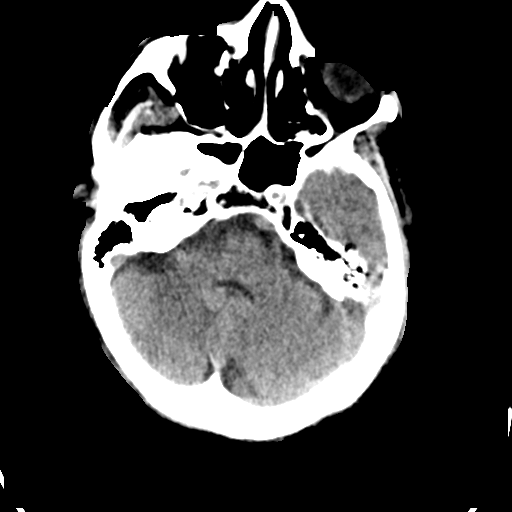
[im 13/34  brain]
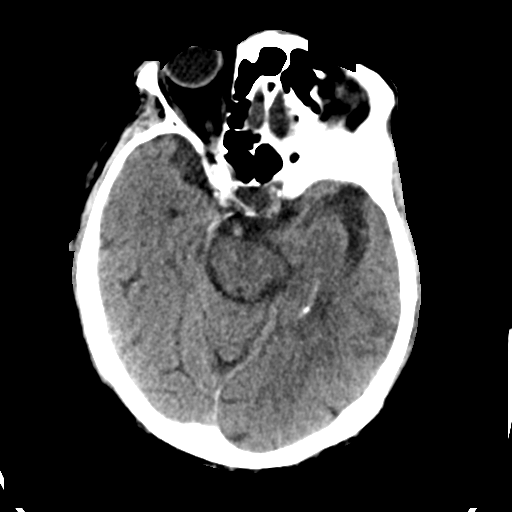
[im 17/34  brain]
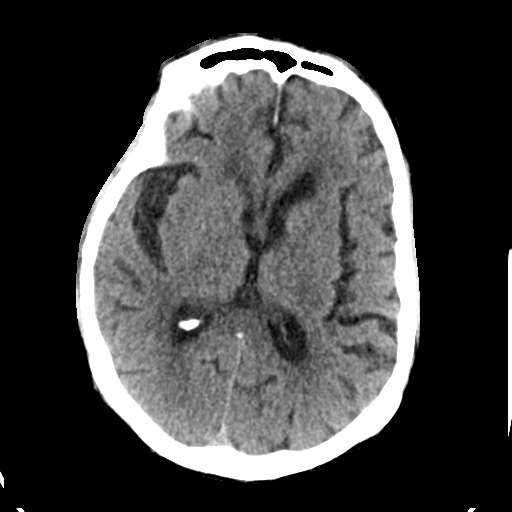
[im 21/34  brain]
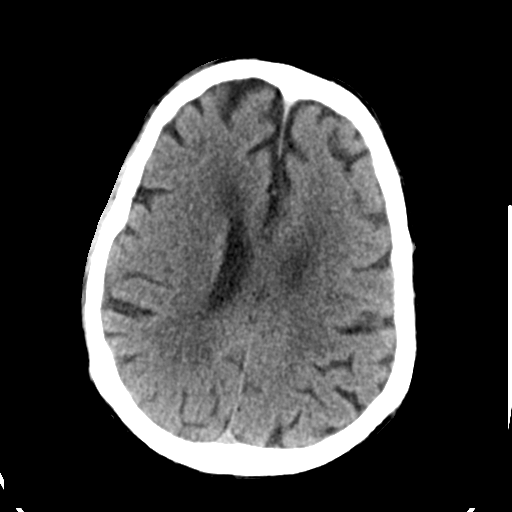
[im 21/34  bone]
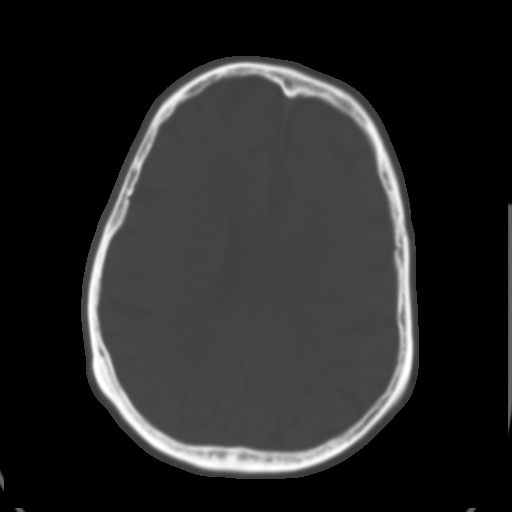
[im 25/34  brain]
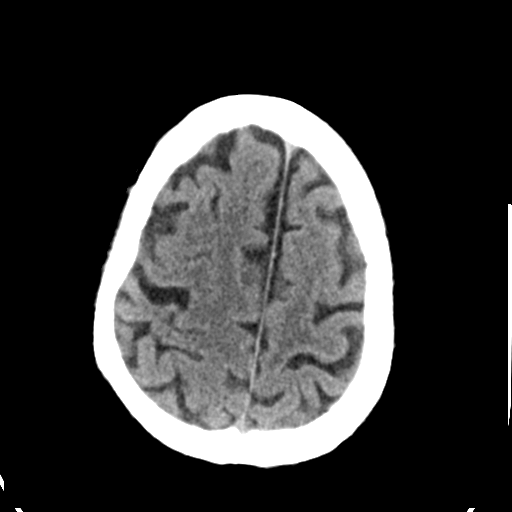
[im 29/34  brain]
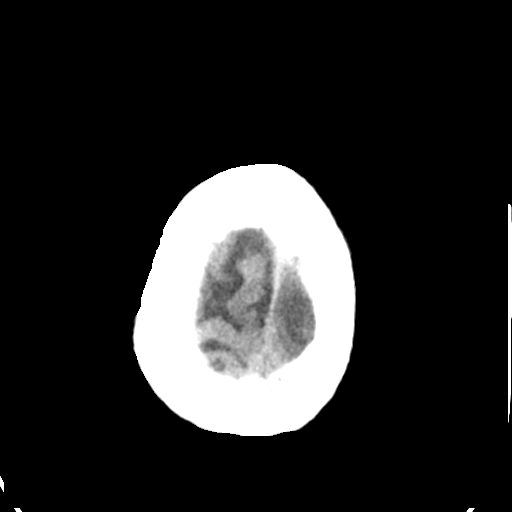

[Series 3: head bone · axial · 0.42mm/px · z∈[+1113,+1147]mm · 3 of 85 slices shown]
[im 9/85  bone]
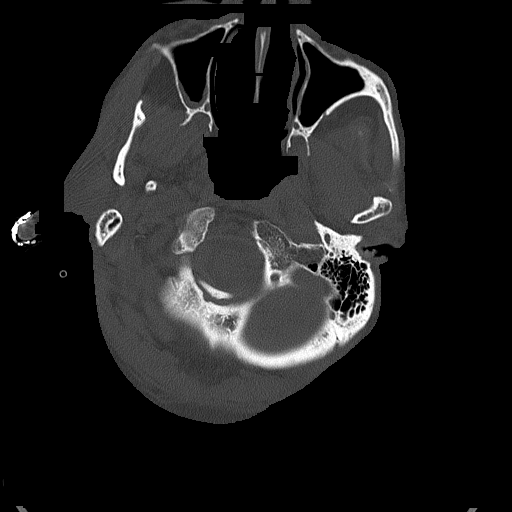
[im 17/85  bone]
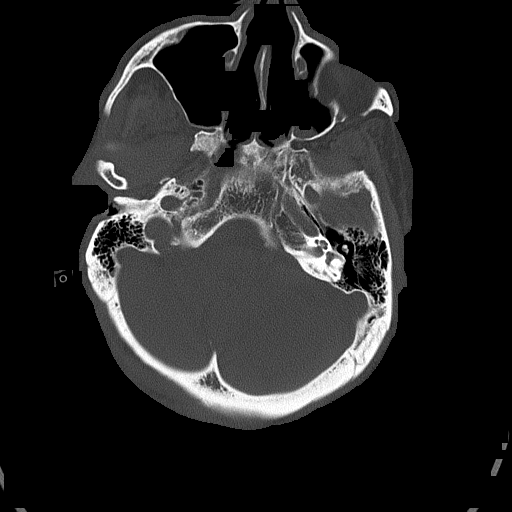
[im 26/85  bone]
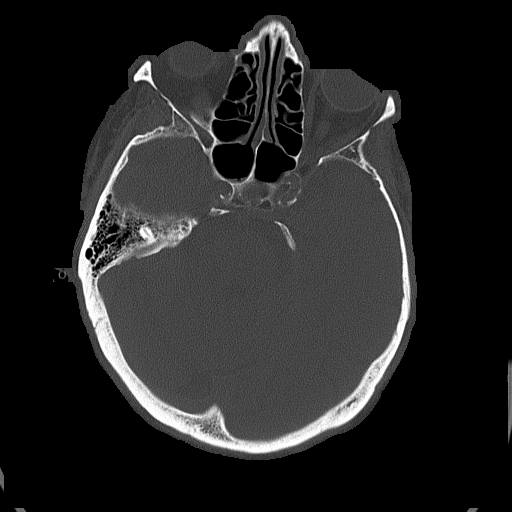

[Series 4: head without cor · coronal · non-contrast · 0.32mm/px · 3 of 68 slices shown]
[im 23/68  brain]
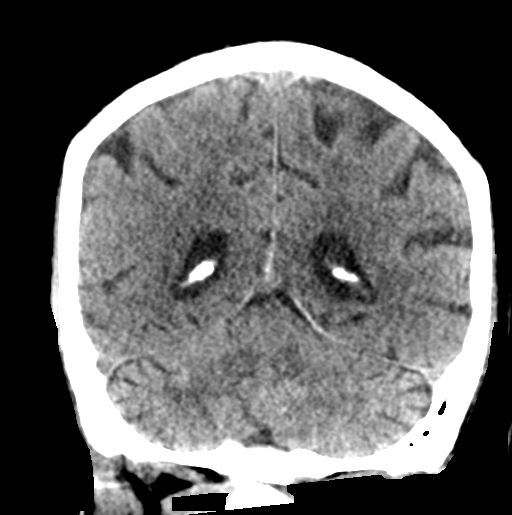
[im 30/68  brain]
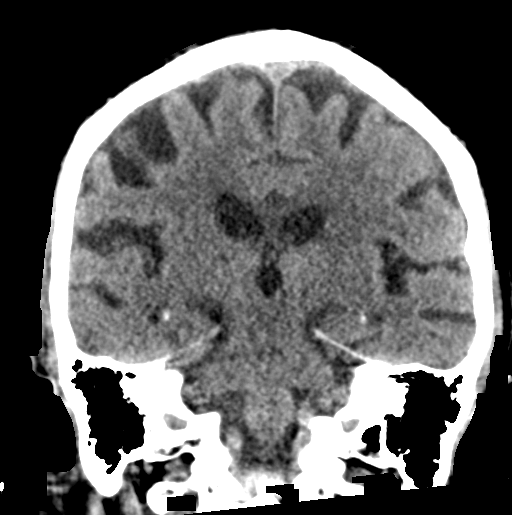
[im 38/68  brain]
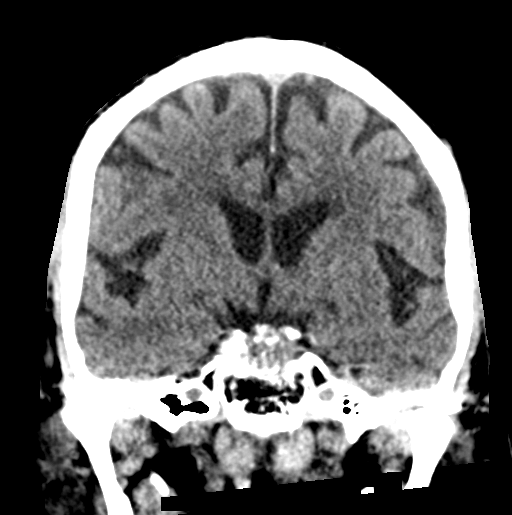

[Series 5: head without sag · sagittal · non-contrast · 0.32mm/px · 3 of 54 slices shown]
[im 19/54  brain]
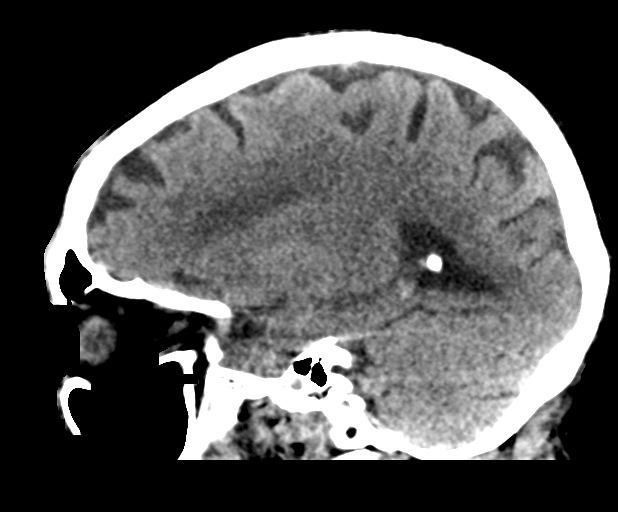
[im 27/54  brain]
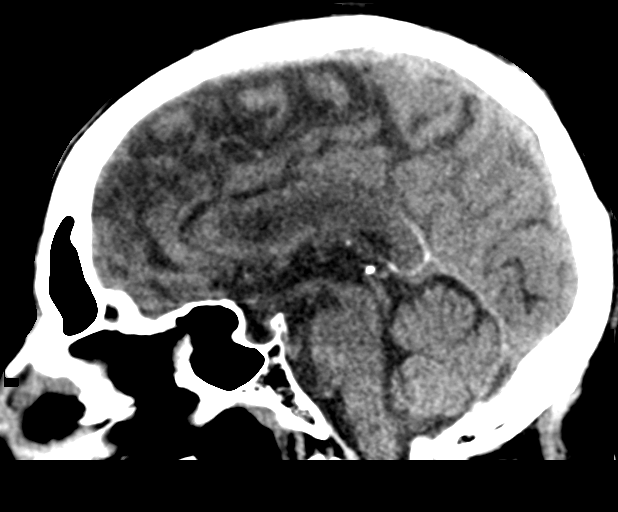
[im 35/54  brain]
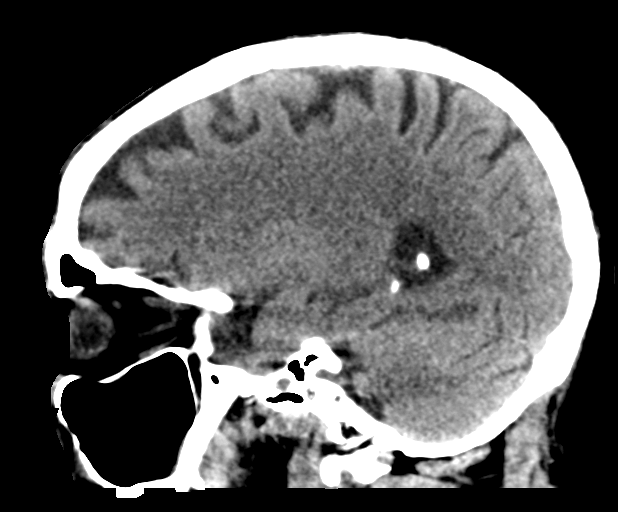

[16 of 47 positions shown; findings below may reference images not displayed]

FINDINGS: Brain: No evidence of acute infarction, hemorrhage, hydrocephalus,
extra-axial collection or mass lesion/mass effect.

Prominence of the ventricles and sulci reflects mild cortical volume
loss. Mild cerebellar atrophy is noted. Scattered periventricular
white matter change likely reflects small vessel ischemic
microangiopathy.

The brainstem and fourth ventricle are within normal limits. The
basal ganglia are unremarkable in appearance. The cerebral
hemispheres demonstrate grossly normal gray-white differentiation.
No mass effect or midline shift is seen.

Vascular: No hyperdense vessel or unexpected calcification.

Skull: There is no evidence of fracture; visualized osseous
structures are unremarkable in appearance.

Sinuses/Orbits: The orbits are within normal limits. There is mild
partial opacification of the left maxillary sinus. The remaining
paranasal sinuses and mastoid air cells are well-aerated.

Other: No significant soft tissue abnormalities are seen.
IMPRESSION: 1. No acute intracranial pathology seen on CT.
2. Mild cortical volume loss and scattered small vessel ischemic
microangiopathy.
3. Mild partial opacification of the left maxillary sinus.

## 2018-02-19 IMAGING — CR DG CHEST 1V PORT
2 series · 2 of 2 positions shown · non-contrast
Comparison: Radiographs 11/16/2016

CLINICAL DATA: Seizure.

EXAM:
PORTABLE CHEST 1 VIEW

[AP (1 of 2)]
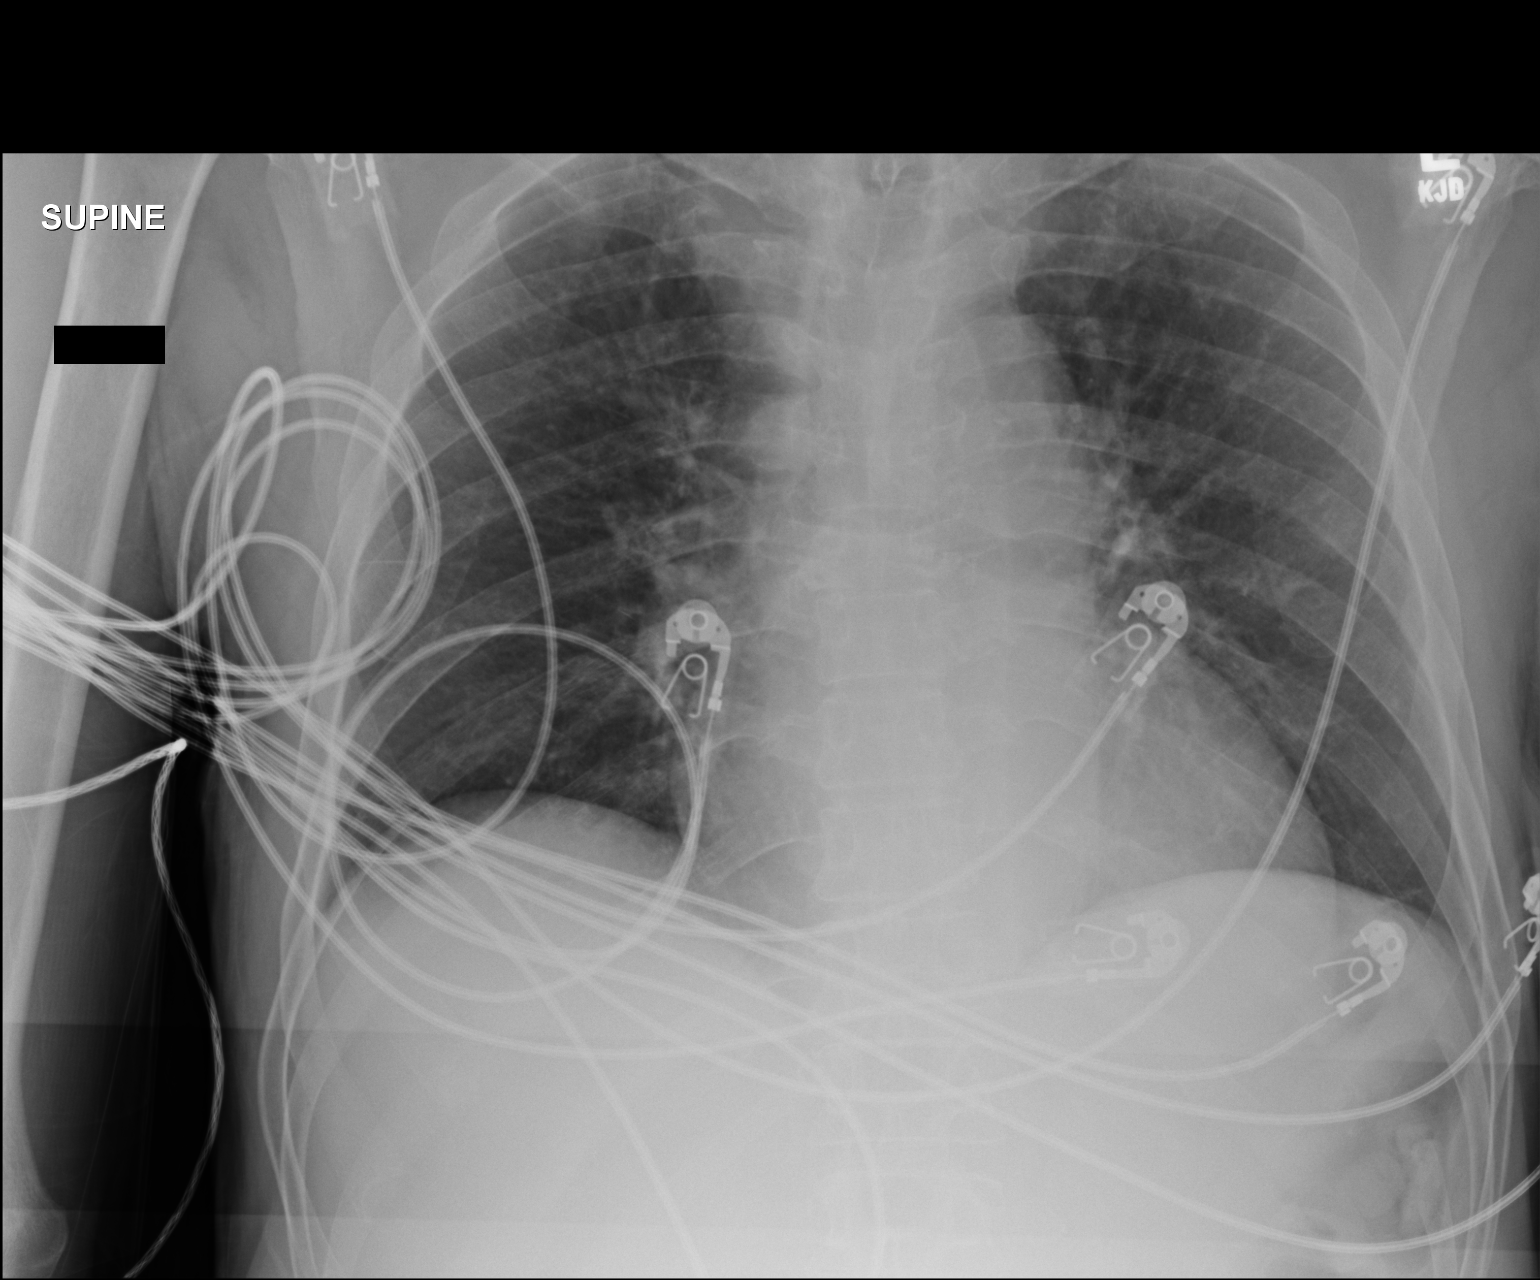

[AP (2 of 2)]
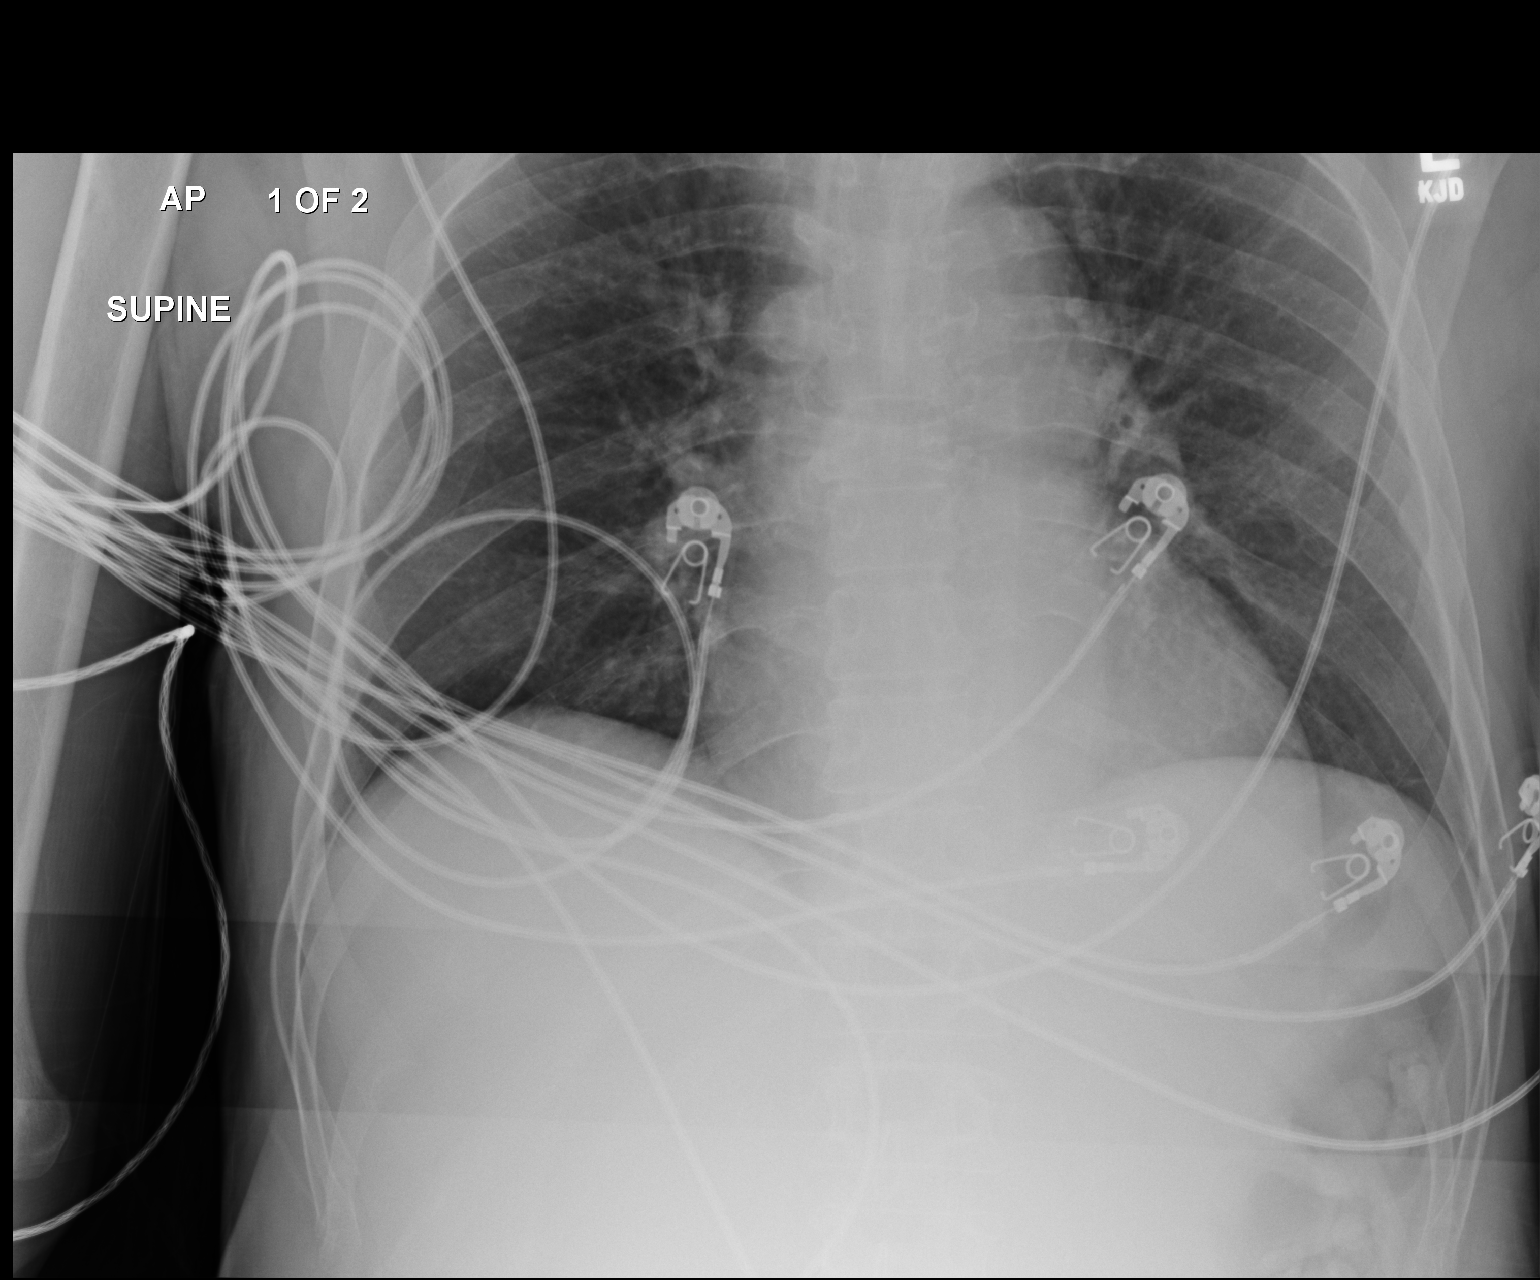

[2 of 2 positions shown; findings below may reference images not displayed]

FINDINGS: Lower lung volumes from prior exam. There is mild cardiomegaly. Mild
vascular congestion and cephalization of pulmonary vasculature. No
focal airspace disease, pleural effusion or pneumothorax. No acute
osseous abnormalities are seen. Degenerative change in both
shoulders.
IMPRESSION: Mild cardiomegaly and vascular congestion. This is new from prior
exam.

## 2018-03-06 IMAGING — DX DG CHEST 1V PORT
1 series · 1 of 1 positions shown · non-contrast
Comparison: 12/13/2016

CLINICAL DATA: Chest pain, hyperglycemia, hypotension. History of
diabetes and hypertension. Former smoker.

EXAM:
PORTABLE CHEST 1 VIEW

[chest ap]
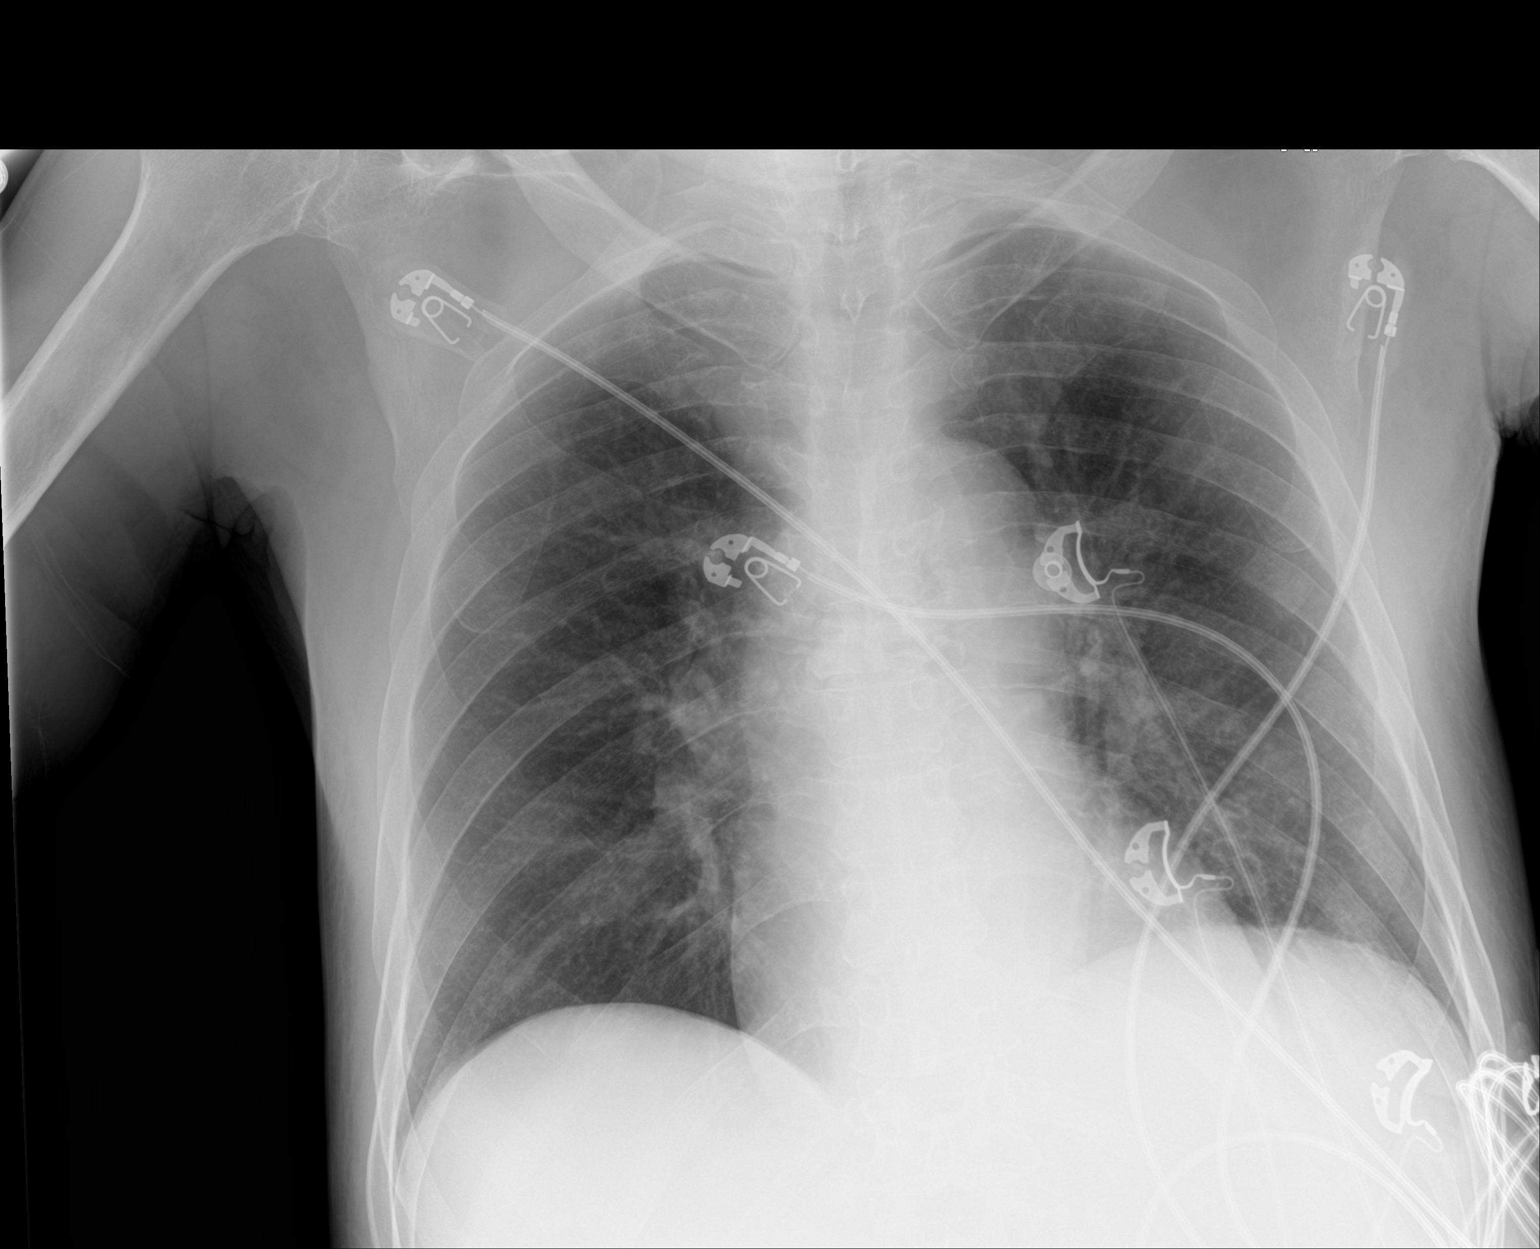

[1 of 1 positions shown; findings below may reference images not displayed]

FINDINGS: Shallow inspiration. Normal heart size and pulmonary vascularity. No
focal airspace disease or consolidation in the lungs. No blunting of
costophrenic angles. No pneumothorax. Mediastinal contours appear
intact.
IMPRESSION: No active disease.

## 2018-03-09 ENCOUNTER — Other Ambulatory Visit: Payer: Self-pay | Admitting: Internal Medicine

## 2018-03-12 ENCOUNTER — Other Ambulatory Visit: Payer: Self-pay | Admitting: Internal Medicine

## 2018-04-02 ENCOUNTER — Ambulatory Visit: Payer: Medicare Other | Admitting: Family Medicine

## 2018-04-02 ENCOUNTER — Other Ambulatory Visit: Payer: Self-pay | Admitting: Family Medicine

## 2018-04-07 IMAGING — CR DG CHEST 2V
2 series · 2 of 2 positions shown · non-contrast
Comparison: 12/28/2016

CLINICAL DATA: Left rib pain. No known injury. History of diabetes
and hypertension.

EXAM:
CHEST  2 VIEW

[w chest lat]
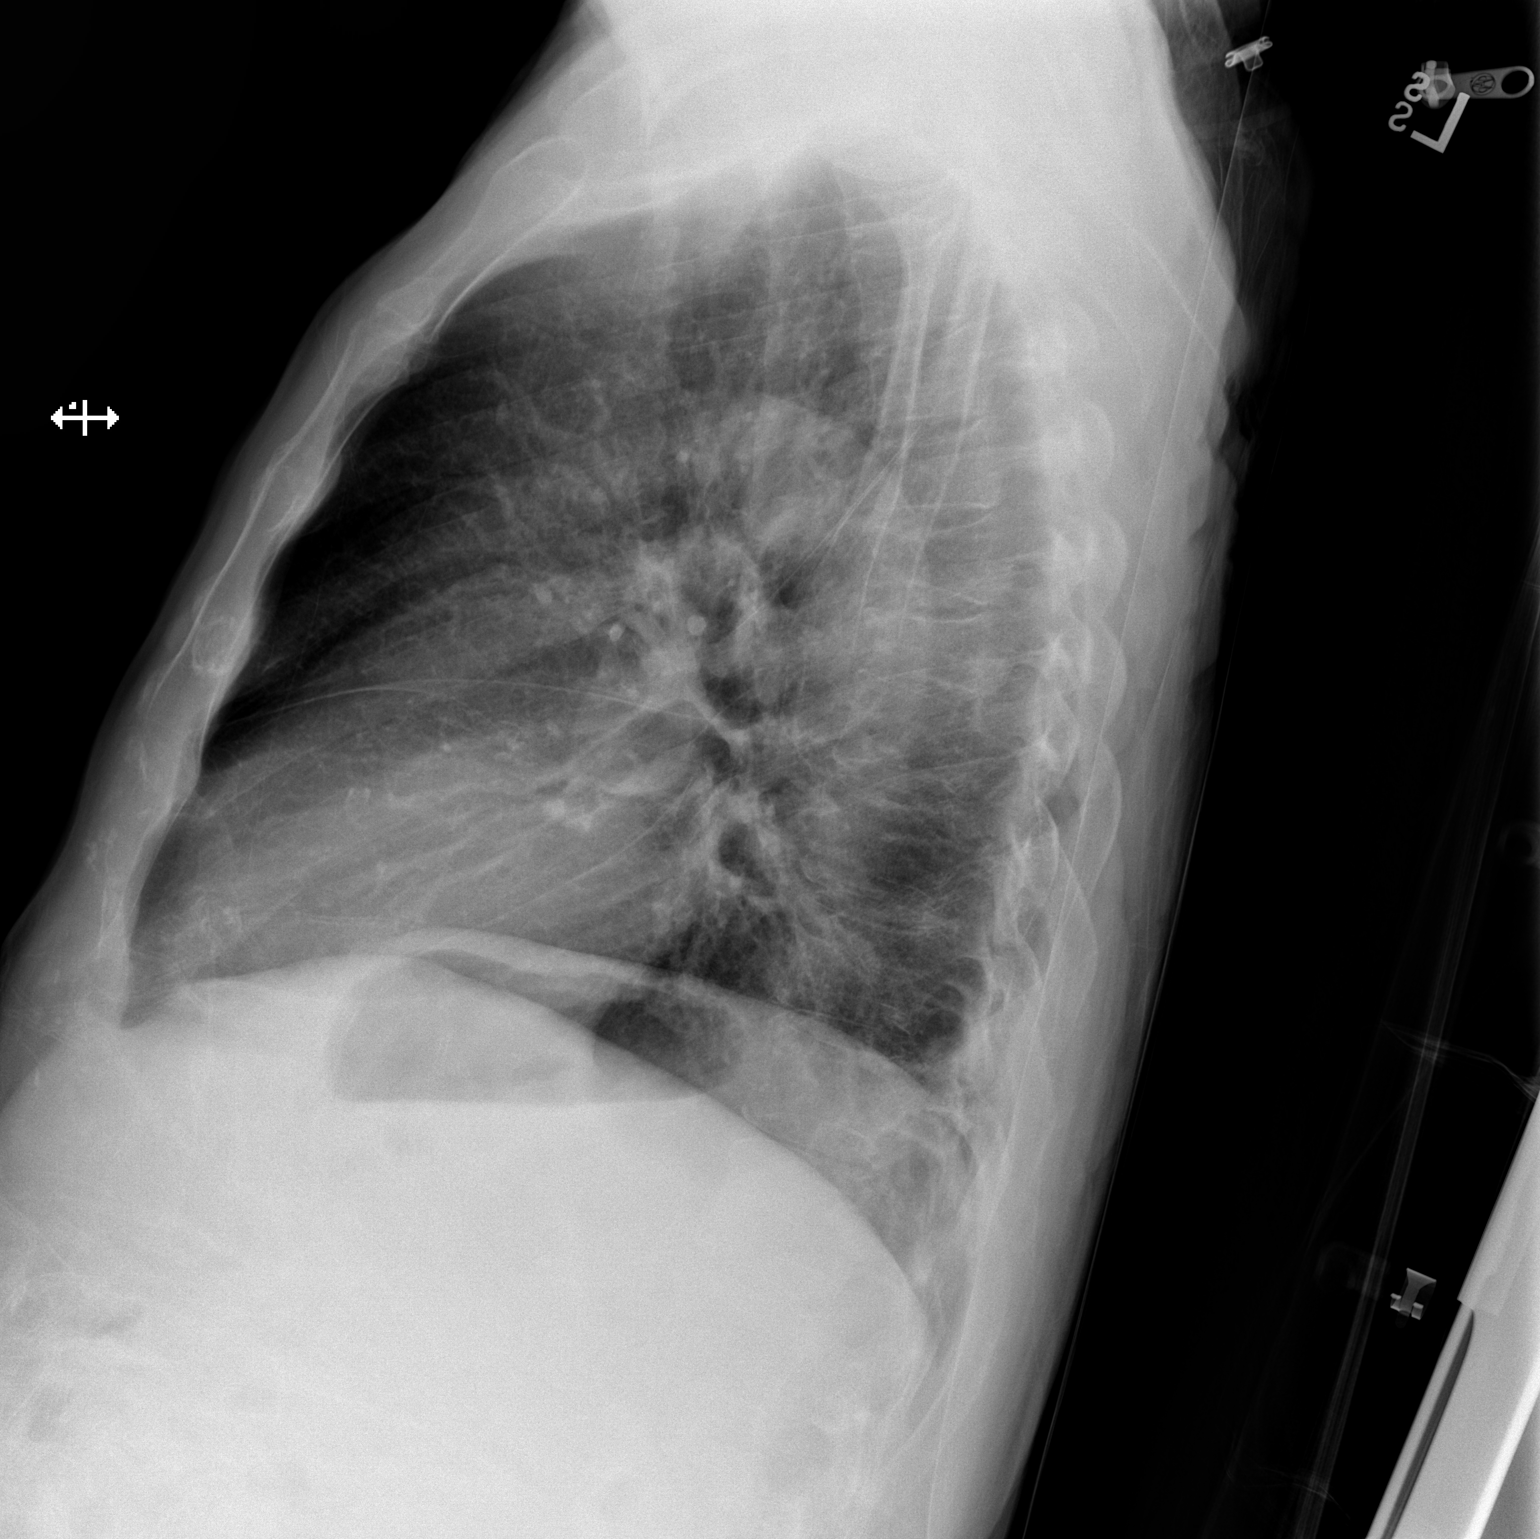

[x chest ap]
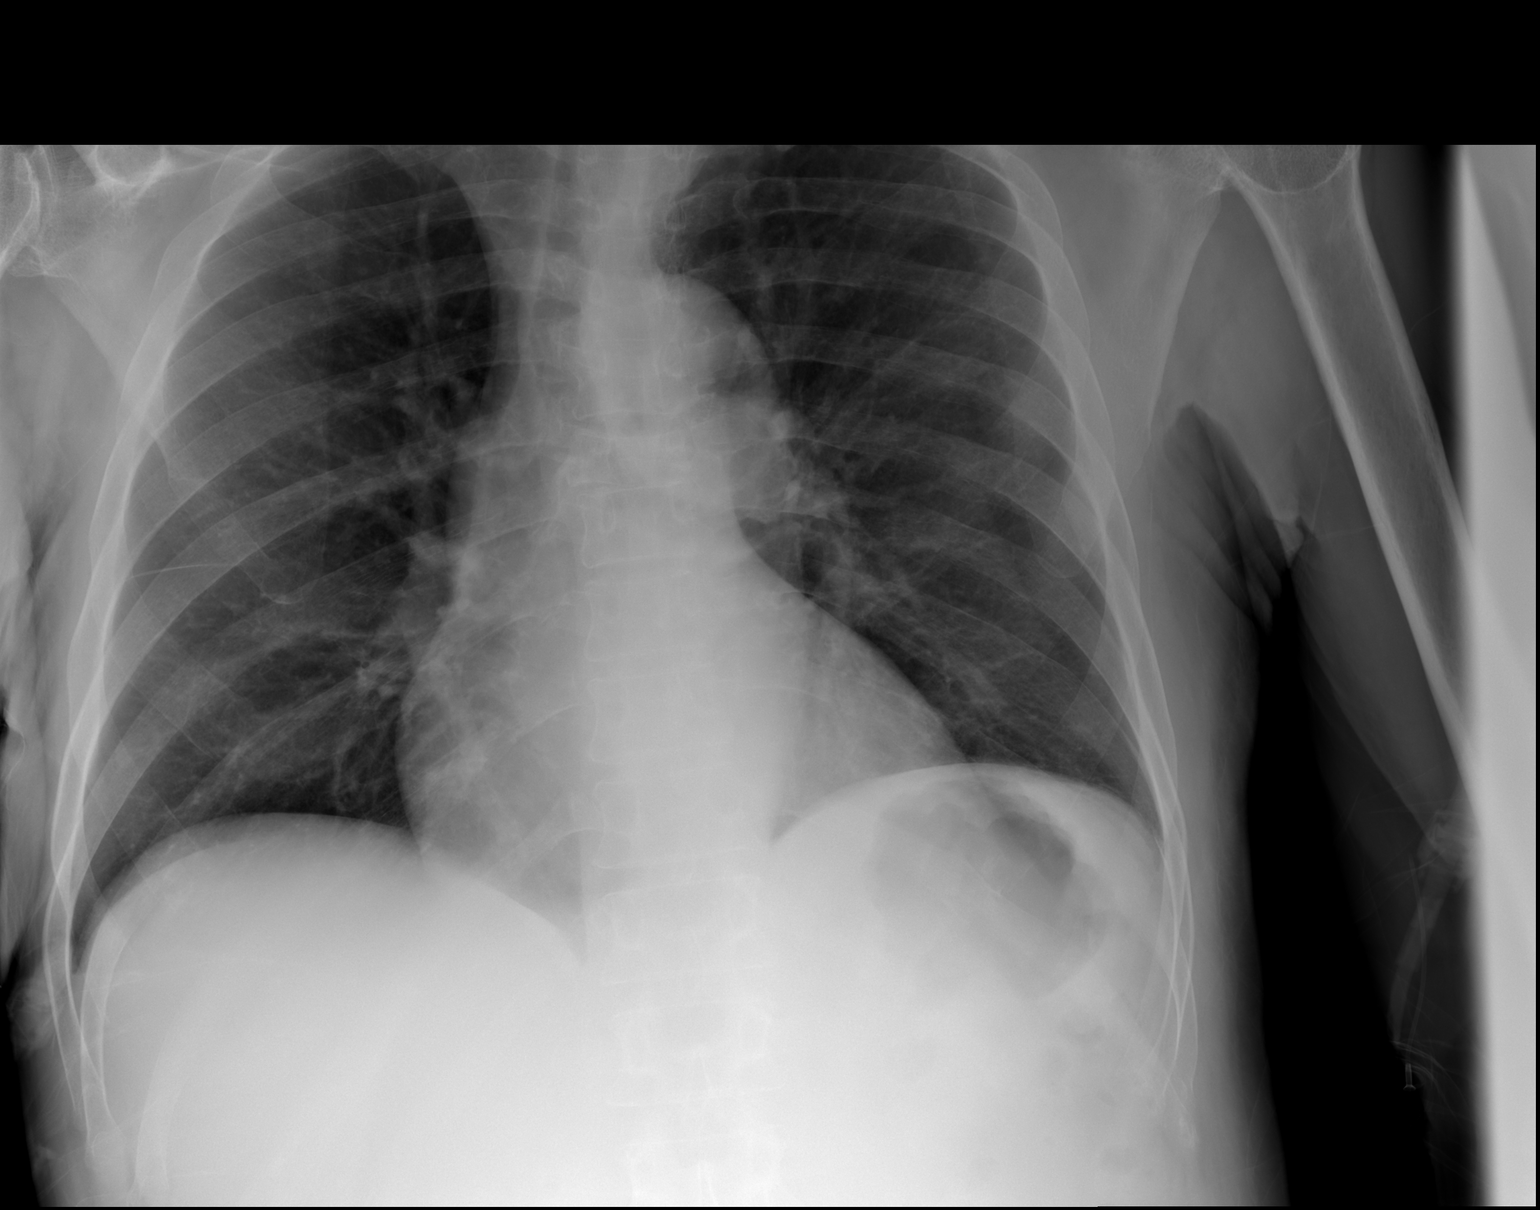

[2 of 2 positions shown; findings below may reference images not displayed]

FINDINGS: Mild irregularity of the posterior left eleventh rib. In the setting
of rib pain, this could indicate a fracture. Shallow inspiration.
Normal heart size and pulmonary vascularity. No focal airspace
disease or consolidation in the lungs. No blunting of costophrenic
angles. No pneumothorax. Mild wedge deformities of mid thoracic
vertebrae without change since 07/05/2016
IMPRESSION: Possible left posterior eleventh rib fracture. No evidence of active
pulmonary disease.

## 2018-04-27 ENCOUNTER — Other Ambulatory Visit: Payer: Self-pay | Admitting: Family Medicine

## 2018-05-12 ENCOUNTER — Ambulatory Visit: Payer: Medicare Other | Admitting: Family Medicine

## 2018-05-16 ENCOUNTER — Other Ambulatory Visit: Payer: Self-pay | Admitting: Family Medicine

## 2018-05-17 ENCOUNTER — Ambulatory Visit: Payer: Medicare Other | Admitting: Family Medicine

## 2018-05-23 DIAGNOSIS — R404 Transient alteration of awareness: Secondary | ICD-10-CM | POA: Diagnosis not present

## 2018-05-23 DIAGNOSIS — I10 Essential (primary) hypertension: Secondary | ICD-10-CM | POA: Diagnosis not present

## 2018-05-23 DIAGNOSIS — E162 Hypoglycemia, unspecified: Secondary | ICD-10-CM | POA: Diagnosis not present

## 2018-05-23 DIAGNOSIS — E161 Other hypoglycemia: Secondary | ICD-10-CM | POA: Diagnosis not present

## 2018-05-23 DIAGNOSIS — R0902 Hypoxemia: Secondary | ICD-10-CM | POA: Diagnosis not present

## 2018-05-25 ENCOUNTER — Encounter: Payer: Self-pay | Admitting: Family Medicine

## 2018-05-25 ENCOUNTER — Ambulatory Visit (INDEPENDENT_AMBULATORY_CARE_PROVIDER_SITE_OTHER): Payer: Medicare Other | Admitting: Family Medicine

## 2018-05-25 ENCOUNTER — Ambulatory Visit: Payer: Medicare Other | Admitting: Family Medicine

## 2018-05-25 VITALS — BP 124/86 | HR 80 | Temp 98.0°F | Ht 69.5 in | Wt 149.0 lb

## 2018-05-25 DIAGNOSIS — Z0000 Encounter for general adult medical examination without abnormal findings: Secondary | ICD-10-CM | POA: Diagnosis not present

## 2018-05-25 DIAGNOSIS — E119 Type 2 diabetes mellitus without complications: Secondary | ICD-10-CM

## 2018-05-25 DIAGNOSIS — Z09 Encounter for follow-up examination after completed treatment for conditions other than malignant neoplasm: Secondary | ICD-10-CM | POA: Diagnosis not present

## 2018-05-25 DIAGNOSIS — Z794 Long term (current) use of insulin: Secondary | ICD-10-CM | POA: Diagnosis not present

## 2018-05-25 DIAGNOSIS — R8290 Unspecified abnormal findings in urine: Secondary | ICD-10-CM

## 2018-05-25 DIAGNOSIS — R739 Hyperglycemia, unspecified: Secondary | ICD-10-CM | POA: Diagnosis not present

## 2018-05-25 DIAGNOSIS — I10 Essential (primary) hypertension: Secondary | ICD-10-CM | POA: Diagnosis not present

## 2018-05-25 DIAGNOSIS — F419 Anxiety disorder, unspecified: Secondary | ICD-10-CM

## 2018-05-25 LAB — POCT GLYCOSYLATED HEMOGLOBIN (HGB A1C): Hemoglobin A1C: 9.6 % — AB (ref 4.0–5.6)

## 2018-05-25 LAB — POCT URINALYSIS DIP (MANUAL ENTRY)
Bilirubin, UA: NEGATIVE
Glucose, UA: 500 mg/dL — AB
Ketones, POC UA: NEGATIVE mg/dL
Leukocytes, UA: NEGATIVE
Nitrite, UA: NEGATIVE
Protein Ur, POC: 100 mg/dL — AB
Spec Grav, UA: 1.015 (ref 1.010–1.025)
Urobilinogen, UA: 0.2 E.U./dL
pH, UA: 7 (ref 5.0–8.0)

## 2018-05-25 MED ORDER — INSULIN GLARGINE 100 UNIT/ML SOLOSTAR PEN: 5.0000 [IU] | PEN_INJECTOR | Freq: Every day | SUBCUTANEOUS | 0 refills | Status: DC

## 2018-05-25 MED ORDER — CLONIDINE HCL 0.1 MG PO TABS
0.2000 mg | ORAL_TABLET | Freq: Once | ORAL | Status: AC
  Administered 2018-05-25: 0.2 mg via ORAL

## 2018-05-25 MED ORDER — FLUOXETINE HCL 40 MG PO CAPS: 40.0000 mg | ORAL_CAPSULE | Freq: Every day | ORAL | 3 refills | Status: DC

## 2018-05-25 MED ORDER — INSULIN ASPART 100 UNIT/ML FLEXPEN: 3.0000 [IU] | PEN_INJECTOR | Freq: Three times a day (TID) | SUBCUTANEOUS | 1 refills | Status: DC

## 2018-05-25 MED ORDER — AMLODIPINE BESYLATE 10 MG PO TABS: 10.0000 mg | ORAL_TABLET | Freq: Every day | ORAL | 11 refills | Status: DC

## 2018-05-25 MED ORDER — ASPIRIN 81 MG PO TBEC: 81.0000 mg | DELAYED_RELEASE_TABLET | Freq: Every day | ORAL | 3 refills | Status: DC

## 2018-05-25 MED ORDER — GLUCAGON (RDNA) 1 MG IJ KIT: 1.0000 mg | PACK | Freq: Once | INTRAMUSCULAR | 12 refills | Status: DC | PRN

## 2018-05-25 MED ORDER — GLUCOSE BLOOD VI STRP: ORAL_STRIP | 0 refills | Status: DC

## 2018-05-25 NOTE — Progress Notes (Signed)
Follow Up  Subjective:    Patient ID: [name] [name], male    DOB: [birth year], 72 y.o.   MRN: [id number]   Chief Complaint  Patient presents with  . Follow-up    chronic condition     HPI  Mr. Sheffler has a past medical history of Hypertension, Vitamin B-12 Deficiency, Stroke, DKA, and Diabetes. He is here today for follow up.  Current Status: Since his last office visit, he is doing well. He reports mild weakness. He is s/p: stroke and has left sided weakness. He is ambulating well using his rolling walker. He is accompanied today by his daughter.   He denies fevers, chills, fatigue, recent infections, weight loss, and night sweats.   He has not had any headaches, visual changes, dizziness, and falls.   No chest pain, heart palpitations, cough and shortness of breath reported.   No reports of GI problems such as nausea, vomiting, diarrhea, and constipation. He has no reports of blood in stools, dysuria and hematuria. No depression or anxiety reported.   He denies pain today.   Past Medical History:  Diagnosis Date  . 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 Closed comminuted intertrochanteric fracture of left femur ([place])   . Diabetes mellitus without complication ([place])   . Diabetic hyperosmolar non-ketotic state ([place]) 05/15/2016  . DKA (diabetic ketoacidoses) ([place]) 05/09/2016  . Hypertension   . Postoperative anemia due to acute blood loss 06/18/2016  . Retroperitoneal hematoma 06/18/2016  . Stroke ([place])   . Vitamin B 12 deficiency 06/18/2016   Family History  Problem Relation Age of Onset  . Diabetes Mother   . Alzheimer's disease Mother   . Hypertension Mother   . Hyperlipidemia Father   . Hypertension Father   . Healthy Maternal Grandmother   . Pneumonia Maternal Grandfather     Social History   Socioeconomic History  . Marital status: Single    Spouse name: Not on file  . Number of children: 2  . Years of education: 10   . Highest education level: Not on file  Occupational History  . Occupation: Retired  Scientific laboratory technician  . Financial resource strain: Not on file  . Food insecurity:    Worry: Not on file    Inability: Not on file  . Transportation needs:    Medical: Not on file    Non-medical: Not on file  Tobacco Use  . Smoking status: Current Some Day Smoker    Packs/day: 0.25    Years: 30.00    Pack years: 7.50    Types: Cigarettes    Last attempt to quit: 01/31/2013    Years since quitting: 5.3  . Smokeless tobacco: Never Used  . Tobacco comment: "some days I light one", requests a nicotine patch  Substance and Sexual Activity  . Alcohol use: No  . Drug use: No  . Sexual activity: Never  Lifestyle  . Physical activity:    Days per week: Not on file    Minutes per session: Not on file  . Stress: Not on file  Relationships  . Social connections:    Talks on phone: Not on file    Gets together: Not on file    Attends religious service: Not on file    Active member of club or organization: Not on file    Attends meetings of clubs or organizations: Not on file    Relationship status: Not on file  . Intimate partner  violence:    Fear of current or ex partner: Not on file    Emotionally abused: Not on file    Physically abused: Not on file    Forced sexual activity: Not on file  Other Topics Concern  . Not on file  Social History Narrative   Fun: Likes to do handyman related projects, photography.    Denies religious beliefs effecting health care.     Past Surgical History:  Procedure Laterality Date  . HIP ARTHROPLASTY Right 02/05/2013   Procedure: ARTHROPLASTY BIPOLAR HIP;  Surgeon: [name], MD;  Location: [facility];  Service: Orthopedics;  Laterality: Right;  . INTRAMEDULLARY (IM) NAIL INTERTROCHANTERIC Left 06/16/2016   Procedure: INTRAMEDULLARY (IM) NAIL INTERTROCHANTRIC;  Surgeon: Rod Can, MD;  Location: [facility];  Service: Orthopedics;  Laterality: Left;     Immunization History  Administered Date(s) Administered  . Influenza, High Dose Seasonal PF 08/11/2016, 06/28/2017  . Pneumococcal Polysaccharide-23 03/14/2016  . Tdap 01/29/2017    No outpatient medications have been marked as taking for the 05/25/18 encounter (Office Visit) with Azzie Glatter, [name].    No Known Allergies  BP 124/86   Pulse 80   Temp 98 F (36.7 C) (Oral)   Ht 5' 9.5" (1.765 m)   Wt 149 lb (67.6 kg)   SpO2 97%   BMI 21.69 kg/m    Review of Systems  Constitutional: Negative.   HENT: Negative.   Eyes: Negative.   Respiratory: Negative.   Cardiovascular: Negative.   Gastrointestinal: Negative.   Endocrine: Negative.   Genitourinary: Negative.   Musculoskeletal: Negative.   Skin: Negative.   Allergic/Immunologic: Negative.   Neurological: Positive for weakness (Left sided weakness--r/t history of Stroke.).  Hematological: Negative.   Psychiatric/Behavioral: Negative.    Objective:   Physical Exam  Constitutional: He is oriented to person, place, and time. He appears well-developed and well-nourished.  HENT:  Head: Normocephalic and atraumatic.  Right Ear: External ear normal.  Left Ear: External ear normal.  Nose: Nose normal.  Mouth/Throat: Oropharynx is clear and moist.  Eyes: Pupils are equal, round, and reactive to light. Conjunctivae and EOM are normal.  Neck: Normal range of motion. Neck supple.  Cardiovascular: Normal rate, regular rhythm, normal heart sounds and intact distal pulses.  Pulmonary/Chest: Effort normal and breath sounds normal.  Abdominal: Soft. Bowel sounds are normal.  Musculoskeletal: Normal range of motion.  Neurological: He is alert and oriented to person, place, and time.  Skin: Skin is warm and dry. Capillary refill takes less than 2 seconds.  Scattered bruises over entire body.  Psychiatric: He has a normal mood and affect. His behavior is normal. Judgment and thought content normal.  Nursing note and vitals  reviewed.  Assessment & Plan:   1. Type 2 diabetes mellitus without complication, with long-term current use of insulin ([place]) He will continue to decrease foods/beverages high in sugars and carbs and follow Heart Healthy or DASH diet. Increase physical activity to at least 30 minutes cardio exercise daily.   - POCT glycosylated hemoglobin (Hb A1C) - POCT urinalysis dipstick - cloNIDine (CATAPRES) tablet 0.2 mg - glucagon (GLUCAGON EMERGENCY) 1 MG injection; Inject 1 mg into the vein once as needed for up to 1 dose.  Dispense: 1 each; Refill: 12 - insulin aspart (NOVOLOG) 100 UNIT/ML FlexPen; Inject 3 Units into the skin 3 (three) times daily with meals. For blood sugars greater than 150 only  Dispense: 15 mL; Refill: 1 - Insulin Glargine (LANTUS SOLOSTAR) 100  UNIT/ML Solostar Pen; Inject 5 Units into the skin daily at 10 pm.  Dispense: 15 mL; Refill: 0 - glucose blood (TRUE METRIX BLOOD GLUCOSE TEST) test strip; CHECK BLOOD SUGAR UP TO 4 TIMES A DAY.  Dispense: 300 each; Refill: 0 - CBC with Differential - Comprehensive metabolic panel - Lipid Panel - POCT glucose (manual entry)  2. Essential hypertension Blood pressure is stable at 124/86 today. He will continue Amlodipine as prescribed. He will continue to decrease high sodium intake, excessive alcohol intake, increase potassium intake, smoking cessation, and increase physical activity of at least 30 minutes of cardio activity daily. He will continue to follow Heart Healthy or DASH diet.  - amLODipine (NORVASC) 10 MG tablet; Take 1 tablet (10 mg total) by mouth daily.  Dispense: 30 tablet; Refill: 11 - CBC with Differential - Comprehensive metabolic panel - Lipid Panel  3. Hyperglycemia Hgb A1c has decreased at 9.6 today, from 12.2 on 11/17/2017. He will continue Insulin as prescribed. He will continue to decrease foods/beverages high in sugars and carbs and follow Heart Healthy or DASH diet. Increase physical activity to at least 30  minutes cardio exercise daily.   - POCT glucose (manual entry)  4. Anxiety We will refill Prozac today.  - FLUoxetine (PROZAC) 40 MG capsule; Take 1 capsule (40 mg total) by mouth daily.  Dispense: 90 capsule; Refill: 3 - Comprehensive metabolic panel  5. Abnormal urinalysis - Urine Culture  6. Healthcare maintenance - aspirin 81 MG EC tablet; Take 1 tablet (81 mg total) by mouth daily.  Dispense: 90 tablet; Refill: 3 - Comprehensive metabolic panel - TSH  7. Follow up He will follow up in 3 months.   Meds ordered this encounter  Medications  . cloNIDine (CATAPRES) tablet 0.2 mg  . amLODipine (NORVASC) 10 MG tablet    Sig: Take 1 tablet (10 mg total) by mouth daily.    Dispense:  30 tablet    Refill:  11  . aspirin 81 MG EC tablet    Sig: Take 1 tablet (81 mg total) by mouth daily.    Dispense:  90 tablet    Refill:  3  . FLUoxetine (PROZAC) 40 MG capsule    Sig: Take 1 capsule (40 mg total) by mouth daily.    Dispense:  90 capsule    Refill:  3  . glucagon (GLUCAGON EMERGENCY) 1 MG injection    Sig: Inject 1 mg into the vein once as needed for up to 1 dose.    Dispense:  1 each    Refill:  12  . insulin aspart (NOVOLOG) 100 UNIT/ML FlexPen    Sig: Inject 3 Units into the skin 3 (three) times daily with meals. For blood sugars greater than 150 only    Dispense:  15 mL    Refill:  1  . Insulin Glargine (LANTUS SOLOSTAR) 100 UNIT/ML Solostar Pen    Sig: Inject 5 Units into the skin daily at 10 pm.    Dispense:  15 mL    Refill:  0    Patient needs a appointment  . glucose blood (TRUE METRIX BLOOD GLUCOSE TEST) test strip    Sig: CHECK BLOOD SUGAR UP TO 4 TIMES A DAY.    Dispense:  300 each    Refill:  Le[place],  MSN, [name]-C [facility] [address] [place], [place] [address] [phone]

## 2018-05-26 LAB — CBC WITH DIFFERENTIAL/PLATELET
Basophils Absolute: 0 10*3/uL (ref 0.0–0.2)
Basos: 0 %
EOS (ABSOLUTE): 0.2 10*3/uL (ref 0.0–0.4)
Eos: 2 %
Hematocrit: 35.3 % — ABNORMAL LOW (ref 37.5–51.0)
Hemoglobin: 11.3 g/dL — ABNORMAL LOW (ref 13.0–17.7)
Immature Grans (Abs): 0 10*3/uL (ref 0.0–0.1)
Immature Granulocytes: 0 %
Lymphocytes Absolute: 1.6 10*3/uL (ref 0.7–3.1)
Lymphs: 19 %
MCH: 28.1 pg (ref 26.6–33.0)
MCHC: 32 g/dL (ref 31.5–35.7)
MCV: 88 fL (ref 79–97)
Monocytes Absolute: 0.5 10*3/uL (ref 0.1–0.9)
Monocytes: 6 %
Neutrophils Absolute: 6.2 10*3/uL (ref 1.4–7.0)
Neutrophils: 73 %
Platelets: 83 10*3/uL — CL (ref 150–450)
RBC: 4.02 x10E6/uL — ABNORMAL LOW (ref 4.14–5.80)
RDW: 13.4 % (ref 12.3–15.4)
WBC: 8.6 10*3/uL (ref 3.4–10.8)

## 2018-05-26 LAB — LIPID PANEL
Chol/HDL Ratio: 2.6 ratio (ref 0.0–5.0)
Cholesterol, Total: 204 mg/dL — ABNORMAL HIGH (ref 100–199)
HDL: 79 mg/dL (ref >39)
LDL Calculated: 96 mg/dL (ref 0–99)
Triglycerides: 146 mg/dL (ref 0–149)
VLDL Cholesterol Cal: 29 mg/dL (ref 5–40)

## 2018-05-26 LAB — COMPREHENSIVE METABOLIC PANEL
ALT: 12 IU/L (ref 0–44)
AST: 16 IU/L (ref 0–40)
Albumin/Globulin Ratio: 1.3 (ref 1.2–2.2)
Albumin: 4.1 g/dL (ref 3.5–4.8)
Alkaline Phosphatase: 78 IU/L (ref 39–117)
BUN/Creatinine Ratio: 18 (ref 10–24)
BUN: 31 mg/dL — ABNORMAL HIGH (ref 8–27)
Bilirubin Total: 0.2 mg/dL (ref 0.0–1.2)
CO2: 20 mmol/L (ref 20–29)
Calcium: 9.3 mg/dL (ref 8.6–10.2)
Chloride: 99 mmol/L (ref 96–106)
Creatinine, Ser: 1.7 mg/dL — ABNORMAL HIGH (ref 0.76–1.27)
GFR calc Af Amer: 45 mL/min/{1.73_m2} — ABNORMAL LOW (ref >59)
GFR calc non Af Amer: 39 mL/min/{1.73_m2} — ABNORMAL LOW (ref >59)
Globulin, Total: 3.2 g/dL (ref 1.5–4.5)
Glucose: 364 mg/dL — ABNORMAL HIGH (ref 65–99)
Potassium: 5 mmol/L (ref 3.5–5.2)
Sodium: 136 mmol/L (ref 134–144)
Total Protein: 7.3 g/dL (ref 6.0–8.5)

## 2018-05-26 LAB — TSH: TSH: 2.84 u[IU]/mL (ref 0.450–4.500)

## 2018-05-27 LAB — URINE CULTURE

## 2018-06-03 ENCOUNTER — Telehealth: Payer: Self-pay | Admitting: Family Medicine

## 2018-06-03 ENCOUNTER — Other Ambulatory Visit: Payer: Self-pay | Admitting: Family Medicine

## 2018-06-03 DIAGNOSIS — D696 Thrombocytopenia, unspecified: Secondary | ICD-10-CM

## 2018-06-03 NOTE — Telephone Encounter (Signed)
Attempted to contact patient several times to review recent lab results.

## 2018-06-03 NOTE — Progress Notes (Signed)
Order for CBC in today. Patient will be contacted by scheduler to set up Lab appointment within 1 week to re-evaluate Platelet count. Attempted to contact patient to review labs.

## 2018-06-04 IMAGING — US US RENAL
1 series · 14 of 25 positions shown · non-contrast
Comparison: Abdominal CT 07/06/2016

CLINICAL DATA: Acute renal failure.

EXAM:
RENAL / URINARY TRACT ULTRASOUND COMPLETE

[Series 1: us renal · 0.23mm/px · 14 of 25 slices shown]
[im 1/25]
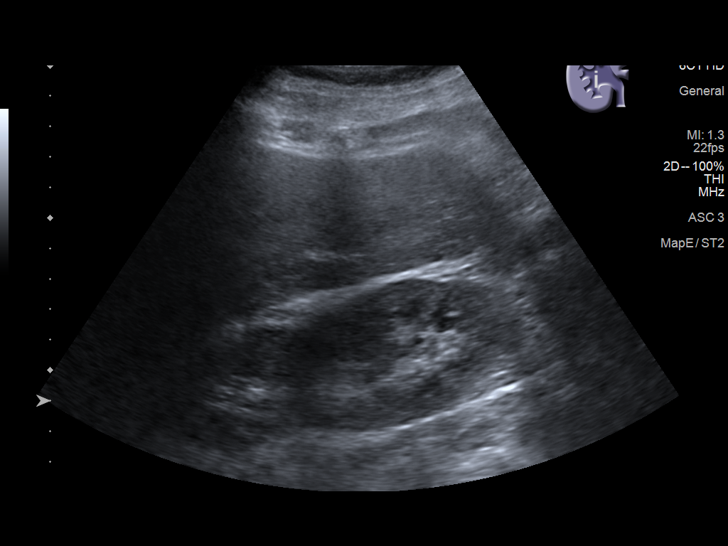
[im 3/25]
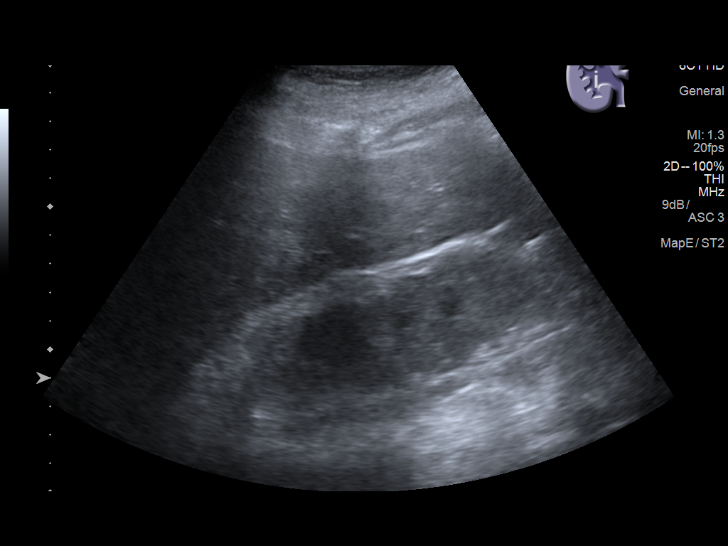
[im 5/25]
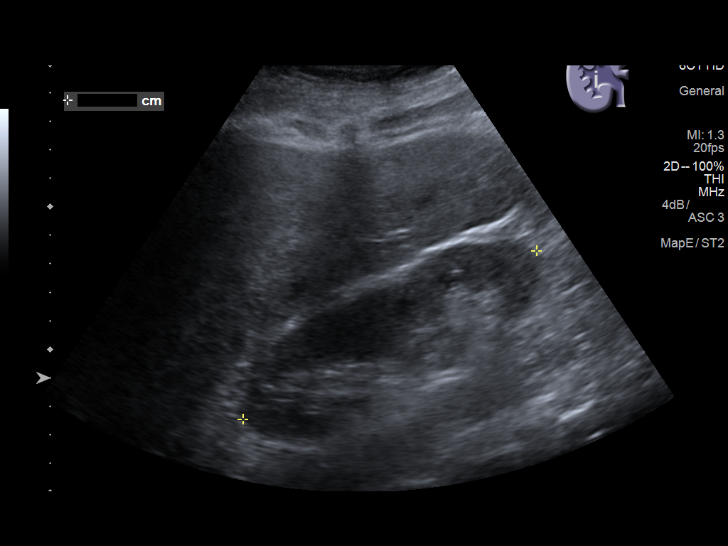
[im 7/25]
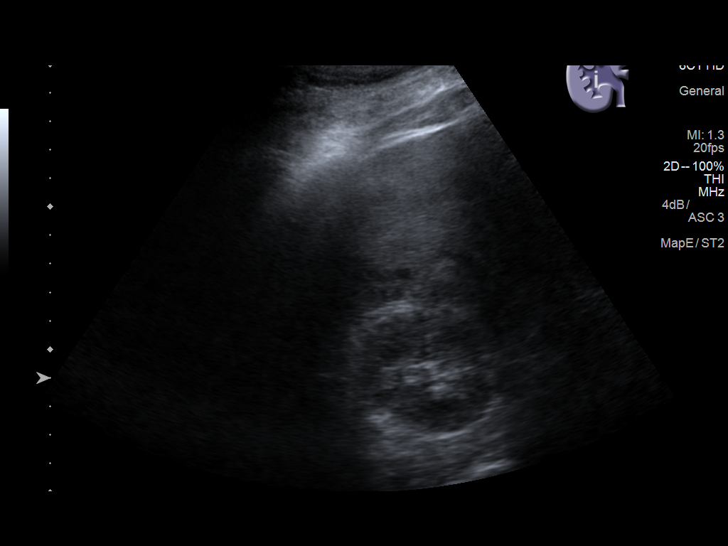
[im 9/25]
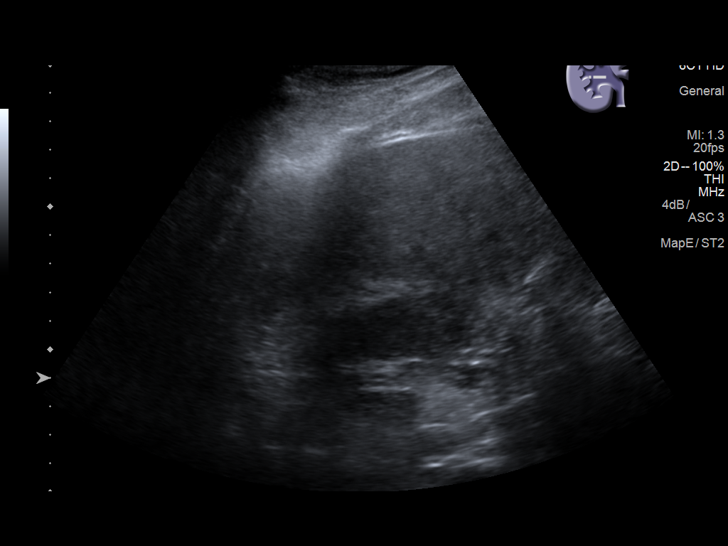
[im 10/25]
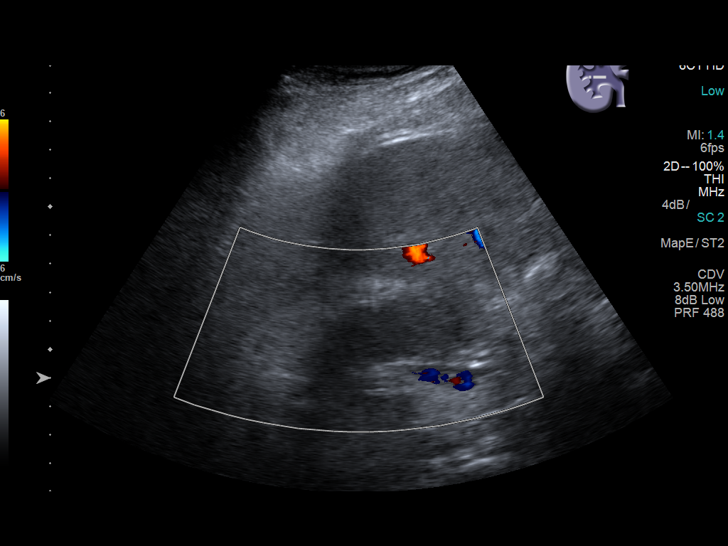
[im 12/25]
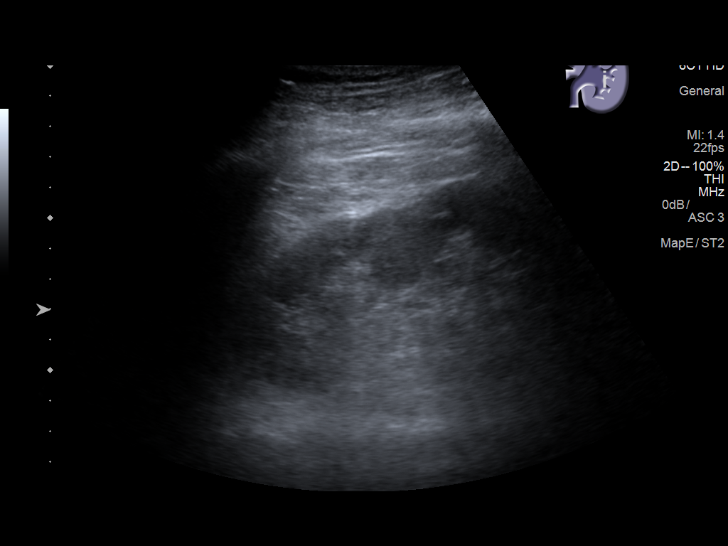
[im 14/25]
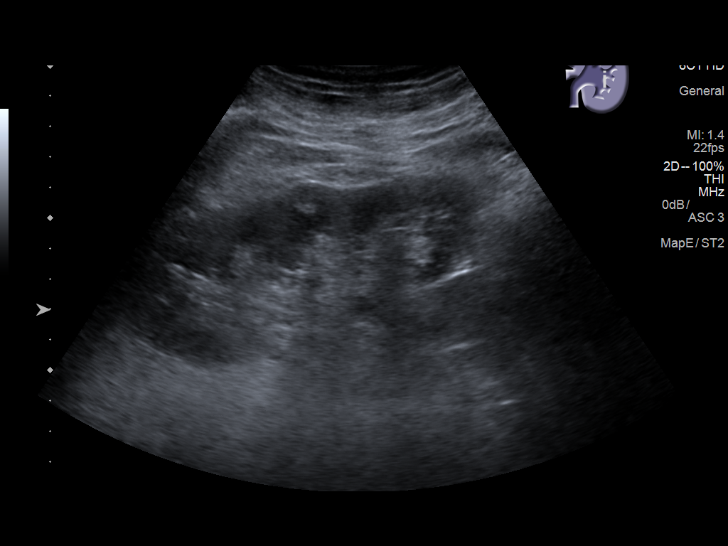
[im 16/25]
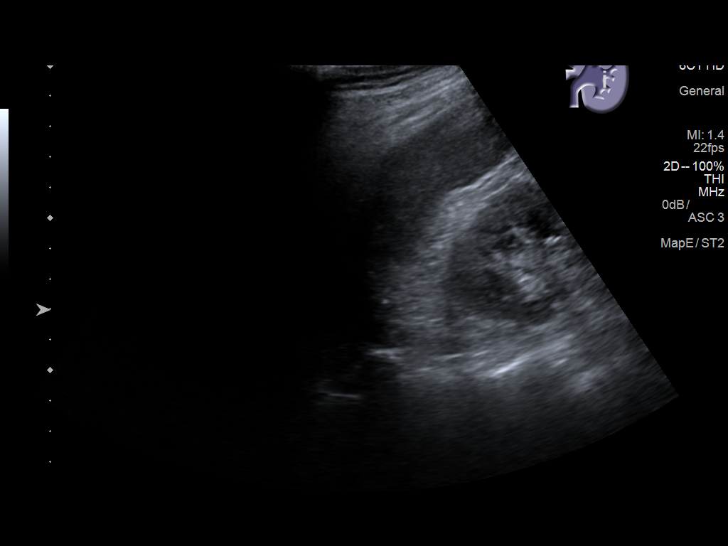
[im 17/25]
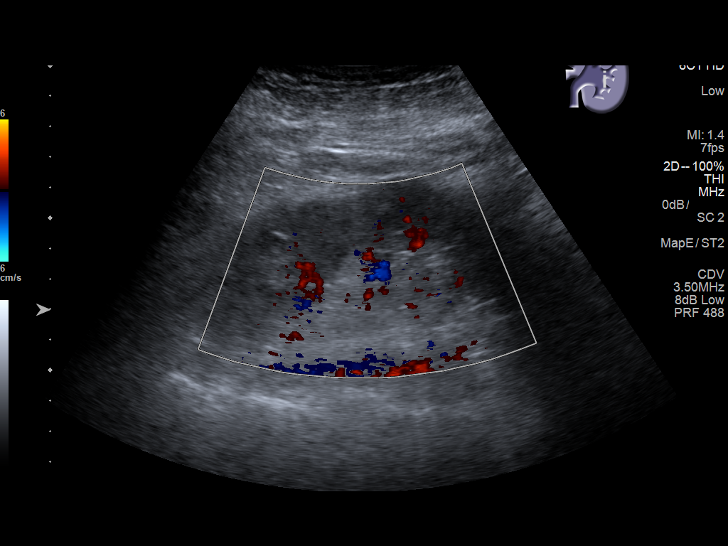
[im 19/25]
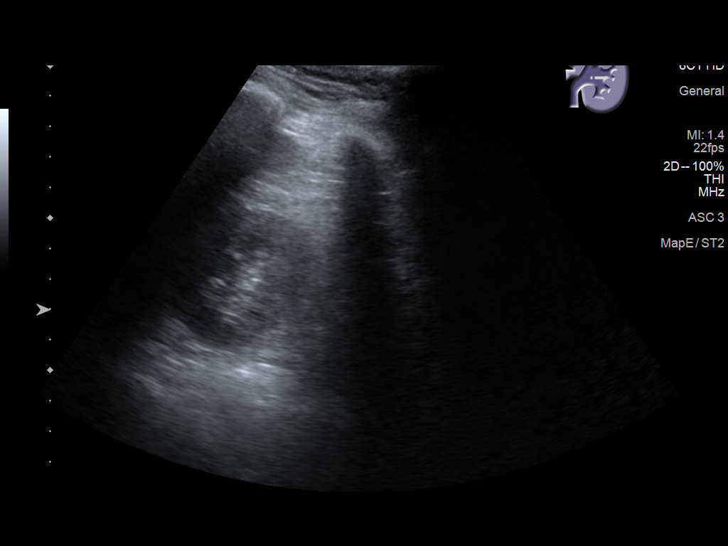
[im 21/25]
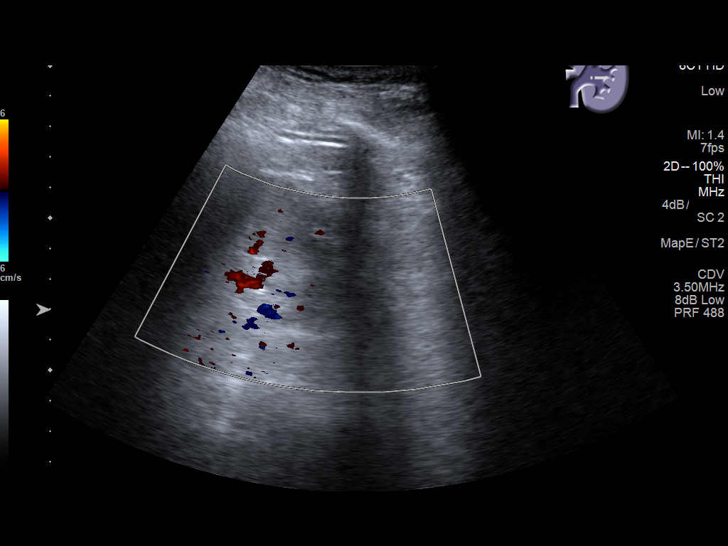
[im 23/25]
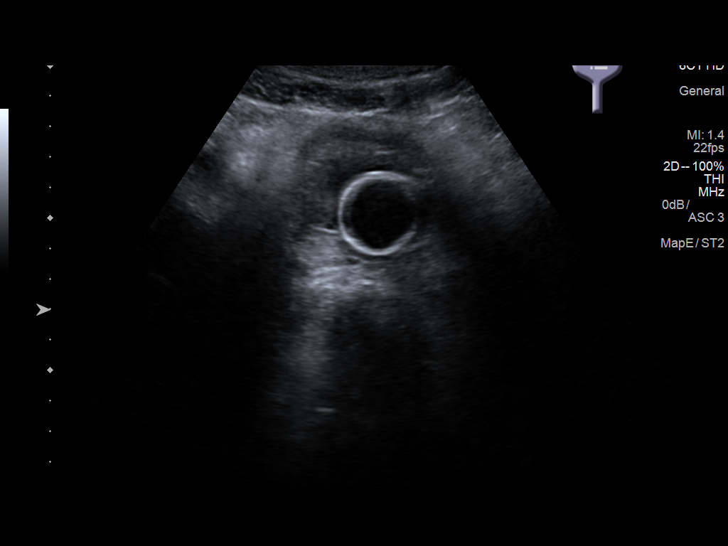
[im 25/25]
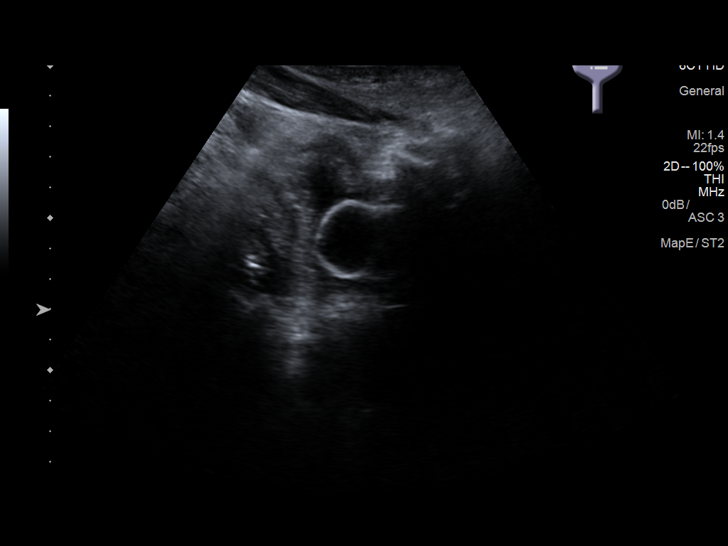

[14 of 25 positions shown; findings below may reference images not displayed]

FINDINGS: Right Kidney:

Length: 11.9 cm. Echogenicity within normal limits. No mass or
hydronephrosis visualized.

Left Kidney:

Length: 12.3 cm. Echogenicity within normal limits. No mass or
hydronephrosis visualized.

Bladder:

Bladder is decompressed with a catheter.
IMPRESSION: Normal renal ultrasound.

## 2018-07-04 IMAGING — DX DG CHEST 1V PORT
1 series · 1 of 1 positions shown · non-contrast
Comparison: 01/29/2017

CLINICAL DATA: Hyperglycemia

EXAM:
PORTABLE CHEST 1 VIEW

[chest ap]
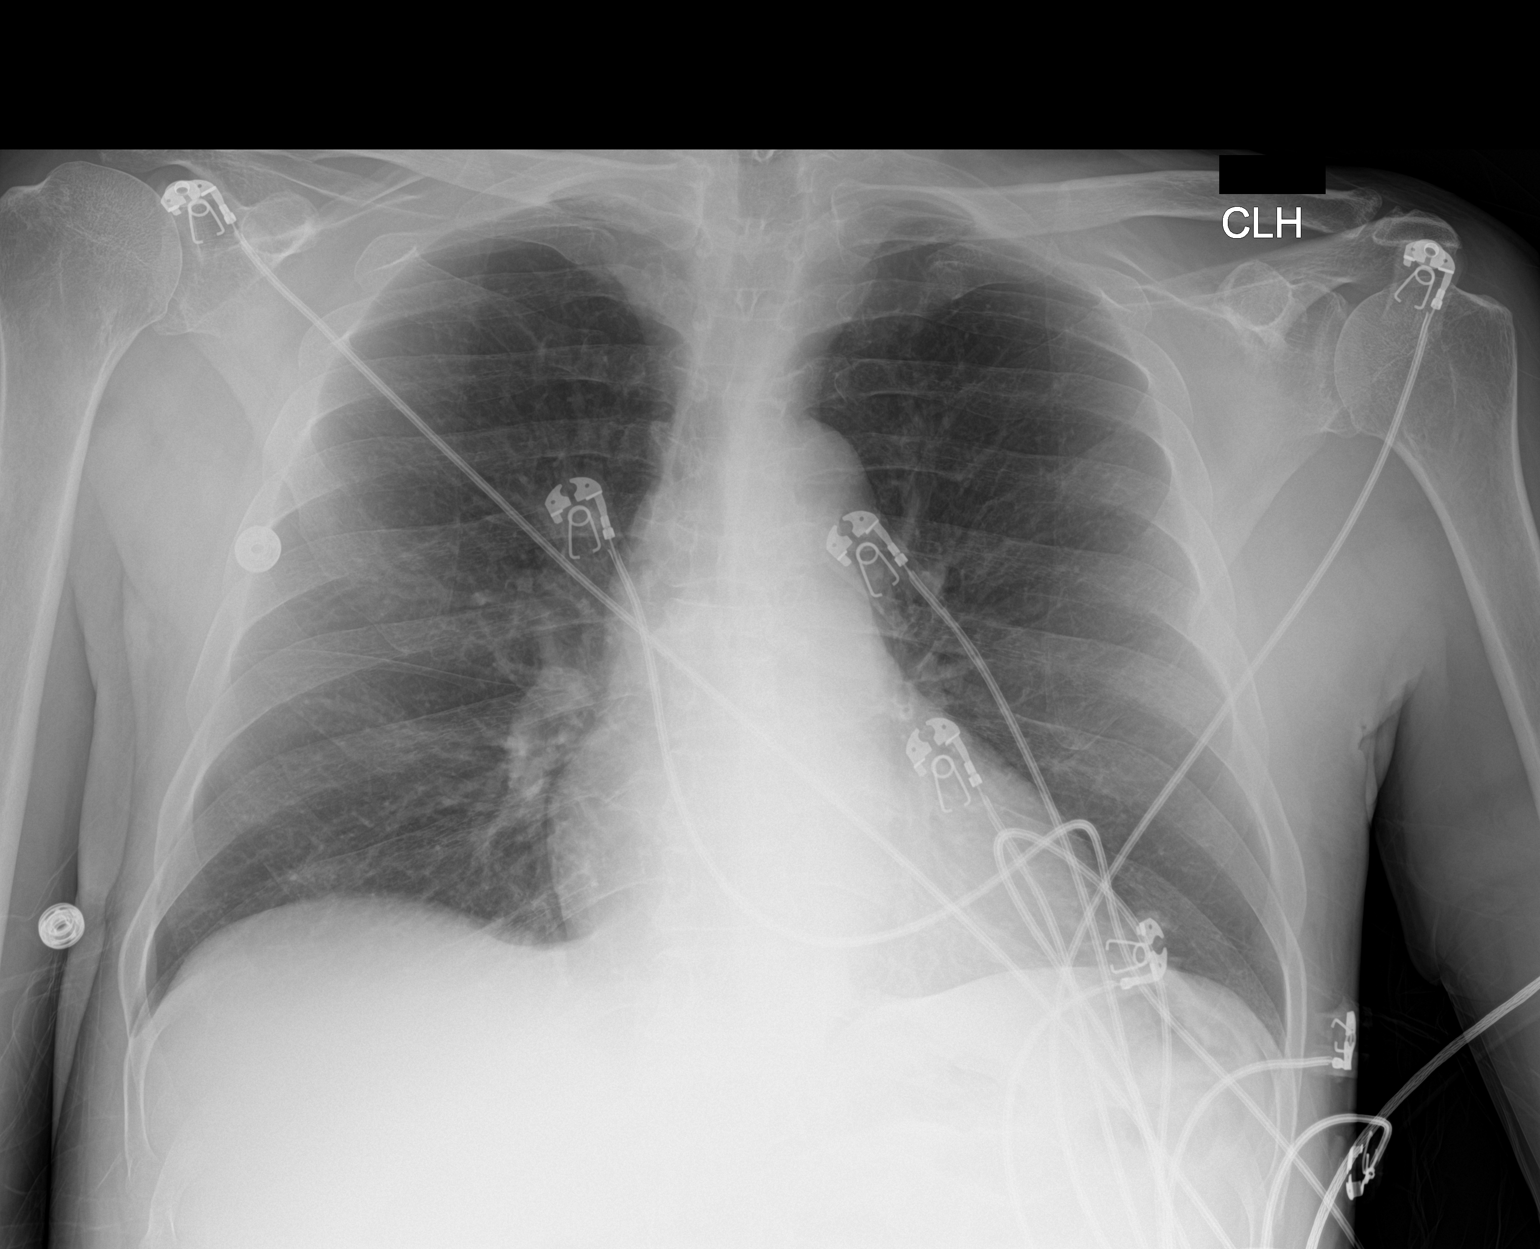

[1 of 1 positions shown; findings below may reference images not displayed]

FINDINGS: The heart size and mediastinal contours are within normal limits.
Both lungs are clear. The visualized skeletal structures are
unremarkable.
IMPRESSION: No active disease.

## 2018-07-05 ENCOUNTER — Other Ambulatory Visit: Payer: Medicare Other

## 2018-07-07 ENCOUNTER — Other Ambulatory Visit: Payer: Medicare Other

## 2018-07-08 ENCOUNTER — Other Ambulatory Visit: Payer: Medicare Other

## 2018-07-21 IMAGING — US US ABDOMEN LIMITED
1 series · 14 of 25 positions shown · non-contrast
Comparison: CT abdomen pelvis 07/06/2016

CLINICAL DATA: Elevated LFTs, hypertension, diabetes mellitus

EXAM:
US ABDOMEN LIMITED - RIGHT UPPER QUADRANT

[Series 1: us abdomen limited · 0.30mm/px · 14 of 58 slices shown]
[im 1/58]
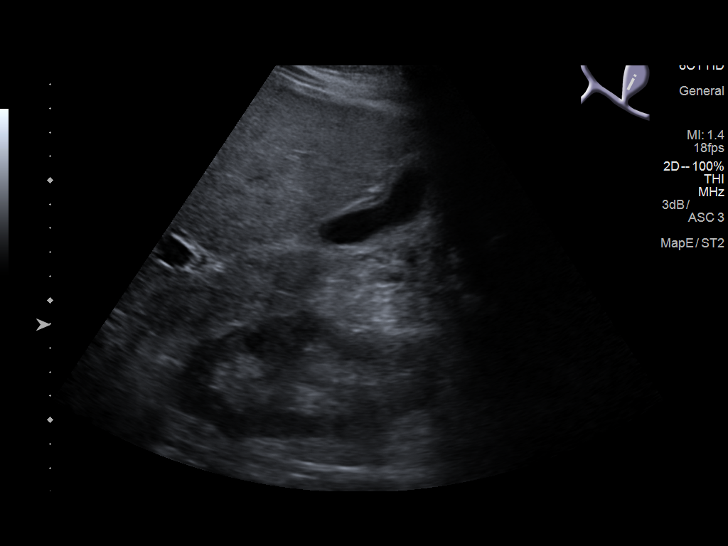
[im 5/58]
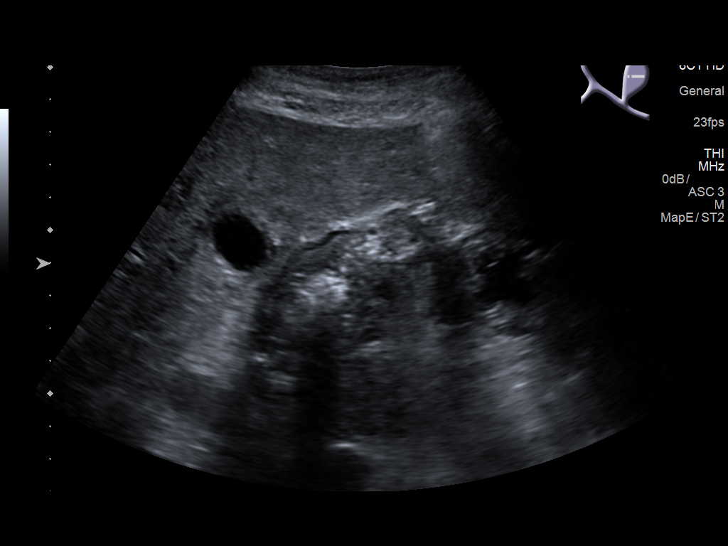
[im 10/58]
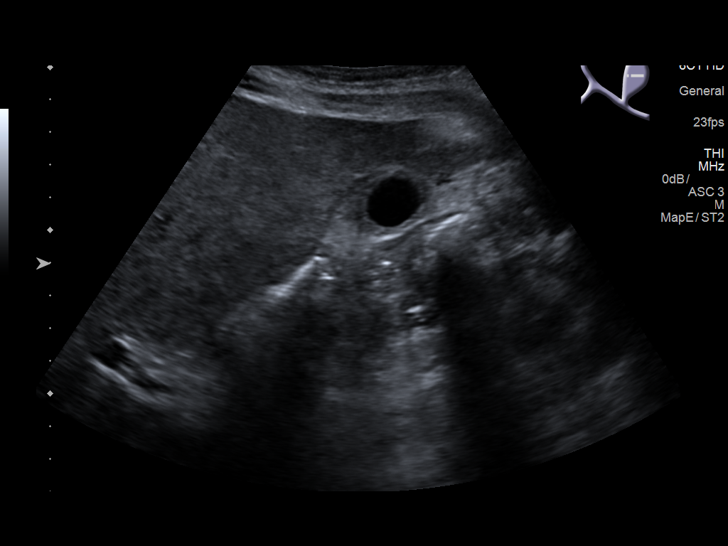
[im 15/58]
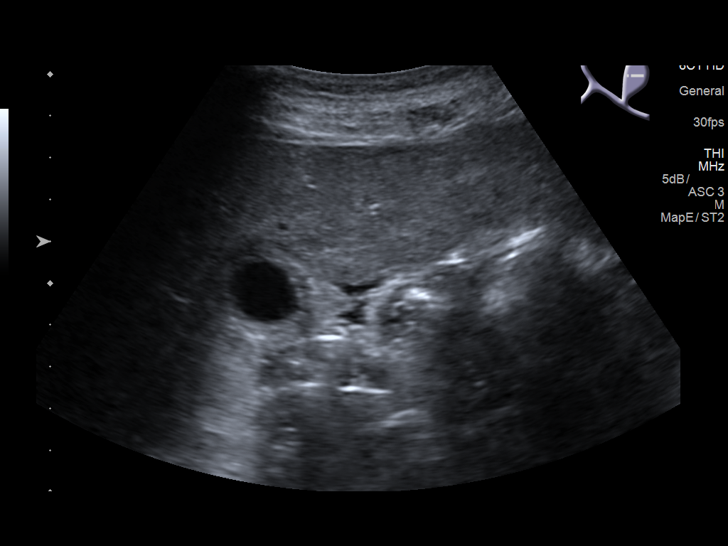
[im 20/58]
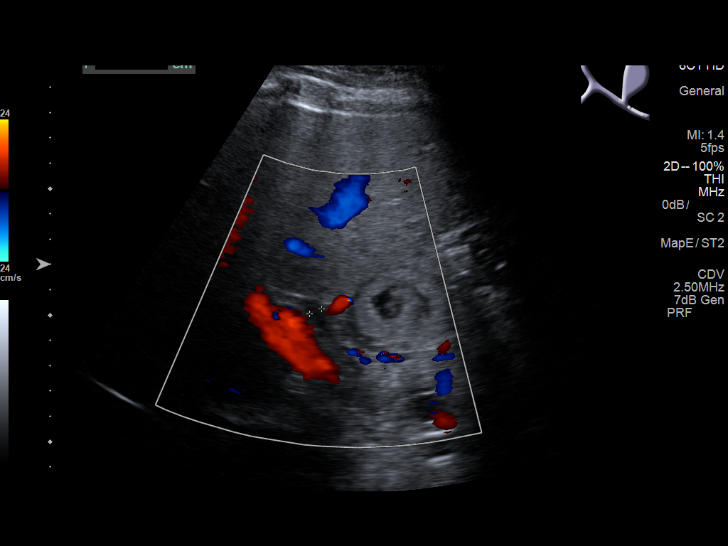
[im 22/58]
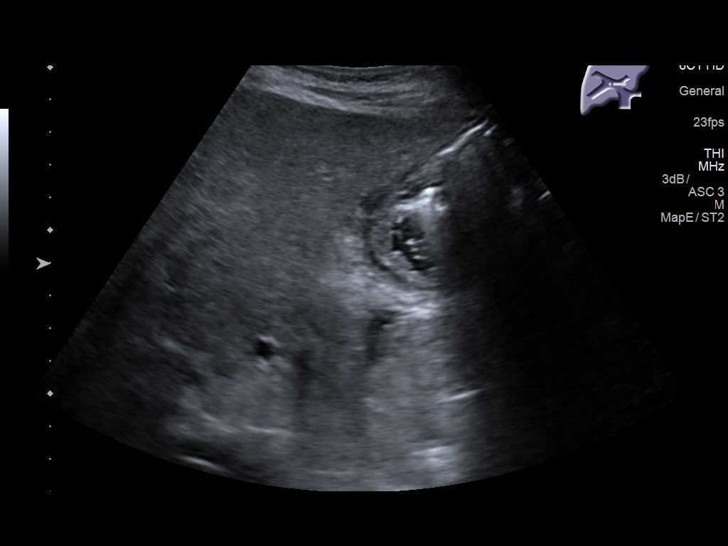
[im 27/58]
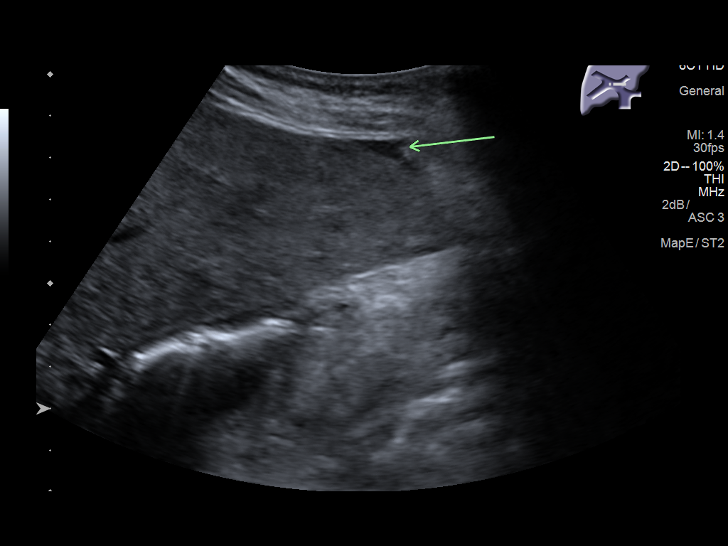
[im 31/58]
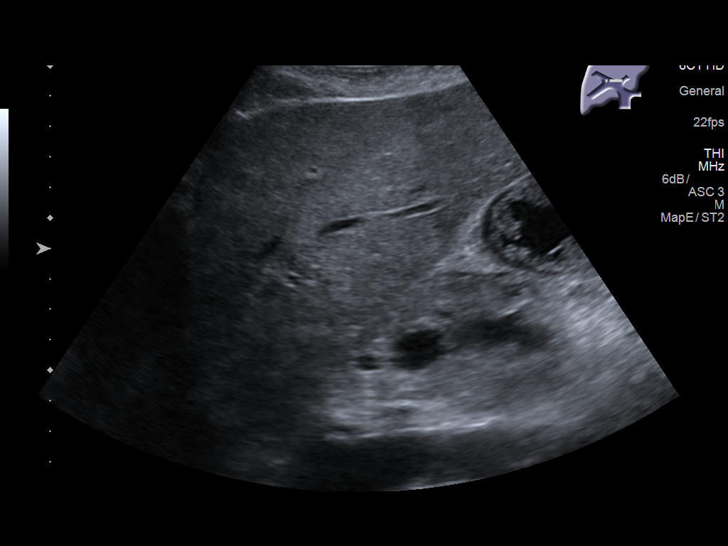
[im 36/58]
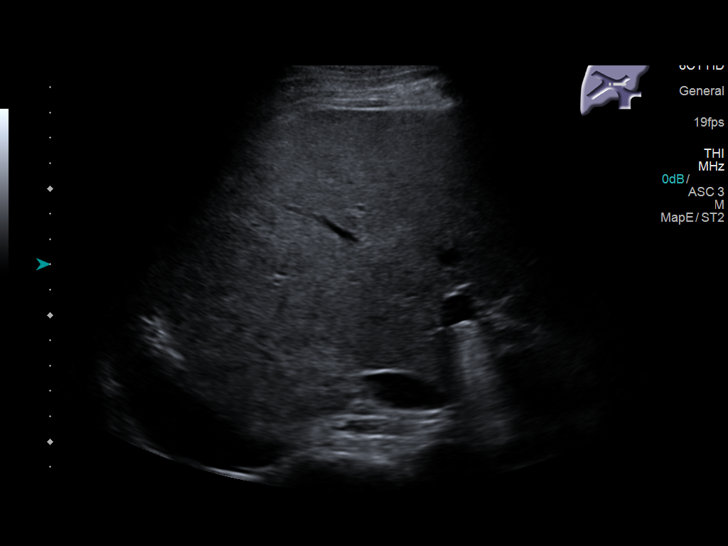
[im 39/58]
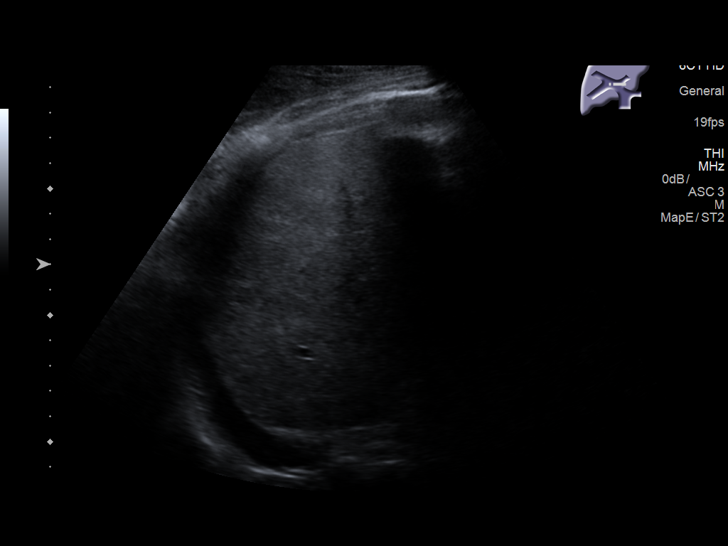
[im 43/58]
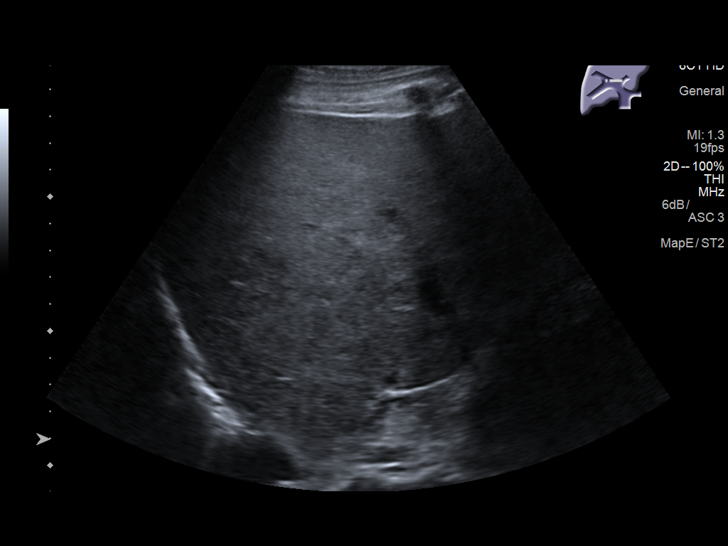
[im 48/58]
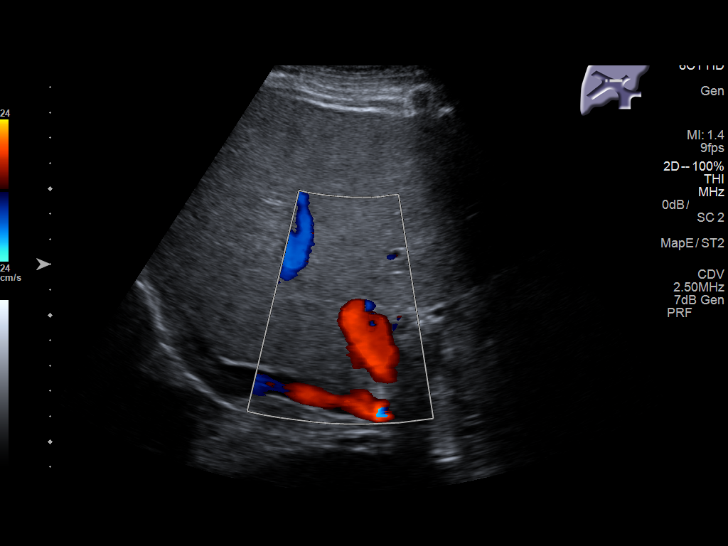
[im 53/58]
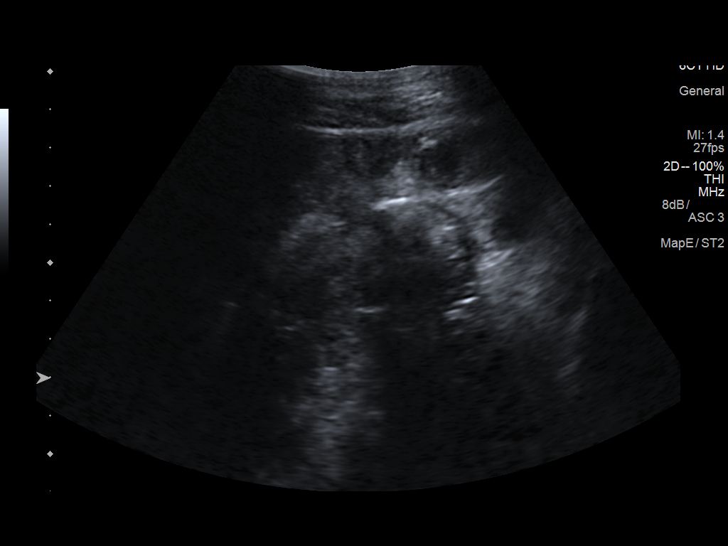
[im 58/58]
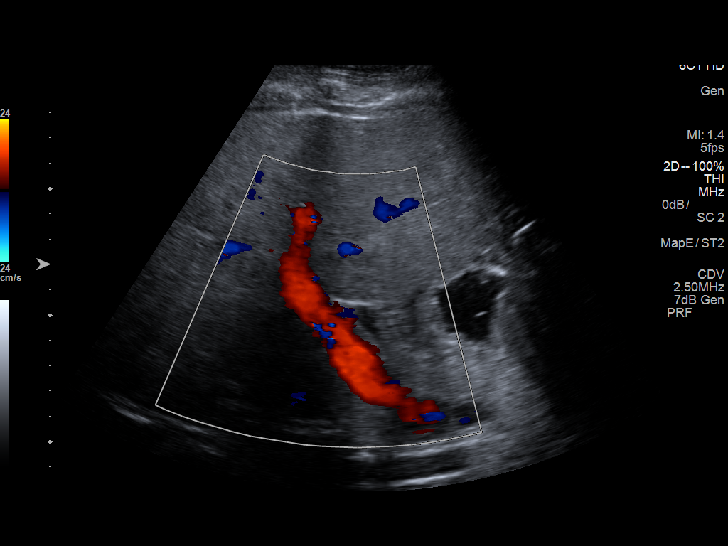

[14 of 25 positions shown; findings below may reference images not displayed]

FINDINGS: Gallbladder:

Incompletely distended with borderline wall thickening; patient
reportedly NPO since 0366 hours last night. No gallstones, definite
pericholecystic fluid or sonographic Murphy sign.

Common bile duct:

Diameter: 5 mm diameter, normal

Liver:

Echogenic, likely fatty infiltration, though this can be seen with
cirrhosis and certain infiltrative disorders. No focal hepatic mass
or nodularity. Hepatopetal portal venous flow.

Trace RIGHT upper quadrant ascites in perihepatic region.
IMPRESSION: Incompletely distended gallbladder with borderline gallbladder wall
thickening, nonspecific.

Probable fatty infiltration of liver.

Trace ascites.

## 2018-08-09 ENCOUNTER — Other Ambulatory Visit: Payer: Medicare Other

## 2018-08-09 DIAGNOSIS — D696 Thrombocytopenia, unspecified: Secondary | ICD-10-CM | POA: Diagnosis not present

## 2018-08-10 LAB — CBC WITH DIFFERENTIAL/PLATELET
Basophils Absolute: 0 10*3/uL (ref 0.0–0.2)
Basos: 0 %
EOS (ABSOLUTE): 0.4 10*3/uL (ref 0.0–0.4)
Eos: 4 %
Hematocrit: 34.2 % — ABNORMAL LOW (ref 37.5–51.0)
Hemoglobin: 11.4 g/dL — ABNORMAL LOW (ref 13.0–17.7)
Immature Grans (Abs): 0 10*3/uL (ref 0.0–0.1)
Immature Granulocytes: 0 %
Lymphocytes Absolute: 2.2 10*3/uL (ref 0.7–3.1)
Lymphs: 23 %
MCH: 29.5 pg (ref 26.6–33.0)
MCHC: 33.3 g/dL (ref 31.5–35.7)
MCV: 89 fL (ref 79–97)
Monocytes Absolute: 0.7 10*3/uL (ref 0.1–0.9)
Monocytes: 8 %
Neutrophils Absolute: 6.1 10*3/uL (ref 1.4–7.0)
Neutrophils: 65 %
Platelets: 132 10*3/uL — ABNORMAL LOW (ref 150–450)
RBC: 3.86 x10E6/uL — ABNORMAL LOW (ref 4.14–5.80)
RDW: 14.8 % (ref 12.3–15.4)
WBC: 9.4 10*3/uL (ref 3.4–10.8)

## 2018-08-17 ENCOUNTER — Emergency Department (HOSPITAL_COMMUNITY)
Admission: EM | Admit: 2018-08-17 | Discharge: 2018-08-17 | Disposition: A | Discharge status: Home / Self Care | Payer: Medicare Other | Attending: Emergency Medicine | Admitting: Emergency Medicine

## 2018-08-17 ENCOUNTER — Encounter (HOSPITAL_COMMUNITY): Payer: Self-pay

## 2018-08-17 ENCOUNTER — Other Ambulatory Visit: Payer: Self-pay

## 2018-08-17 DIAGNOSIS — I10 Essential (primary) hypertension: Secondary | ICD-10-CM | POA: Diagnosis not present

## 2018-08-17 DIAGNOSIS — Z794 Long term (current) use of insulin: Secondary | ICD-10-CM | POA: Insufficient documentation

## 2018-08-17 DIAGNOSIS — I129 Hypertensive chronic kidney disease with stage 1 through stage 4 chronic kidney disease, or unspecified chronic kidney disease: Secondary | ICD-10-CM | POA: Insufficient documentation

## 2018-08-17 DIAGNOSIS — E1122 Type 2 diabetes mellitus with diabetic chronic kidney disease: Secondary | ICD-10-CM | POA: Diagnosis not present

## 2018-08-17 DIAGNOSIS — N183 Chronic kidney disease, stage 3 (moderate): Secondary | ICD-10-CM | POA: Diagnosis not present

## 2018-08-17 DIAGNOSIS — F17210 Nicotine dependence, cigarettes, uncomplicated: Secondary | ICD-10-CM | POA: Diagnosis not present

## 2018-08-17 DIAGNOSIS — E162 Hypoglycemia, unspecified: Secondary | ICD-10-CM

## 2018-08-17 DIAGNOSIS — Z7982 Long term (current) use of aspirin: Secondary | ICD-10-CM | POA: Diagnosis not present

## 2018-08-17 DIAGNOSIS — E11649 Type 2 diabetes mellitus with hypoglycemia without coma: Secondary | ICD-10-CM | POA: Diagnosis not present

## 2018-08-17 DIAGNOSIS — E161 Other hypoglycemia: Secondary | ICD-10-CM | POA: Diagnosis not present

## 2018-08-17 DIAGNOSIS — R0689 Other abnormalities of breathing: Secondary | ICD-10-CM | POA: Diagnosis not present

## 2018-08-17 DIAGNOSIS — R404 Transient alteration of awareness: Secondary | ICD-10-CM | POA: Diagnosis not present

## 2018-08-17 DIAGNOSIS — R7309 Other abnormal glucose: Secondary | ICD-10-CM | POA: Diagnosis present

## 2018-08-17 LAB — COMPREHENSIVE METABOLIC PANEL
ALT: 22 U/L (ref 0–44)
AST: 31 U/L (ref 15–41)
Albumin: 4 g/dL (ref 3.5–5.0)
Alkaline Phosphatase: 66 U/L (ref 38–126)
Anion gap: 10 (ref 5–15)
BUN: 53 mg/dL — ABNORMAL HIGH (ref 8–23)
CO2: 22 mmol/L (ref 22–32)
Calcium: 8.9 mg/dL (ref 8.9–10.3)
Chloride: 104 mmol/L (ref 98–111)
Creatinine, Ser: 1.87 mg/dL — ABNORMAL HIGH (ref 0.61–1.24)
GFR calc Af Amer: 39 mL/min — ABNORMAL LOW (ref >60)
GFR calc non Af Amer: 34 mL/min — ABNORMAL LOW (ref >60)
Glucose, Bld: 254 mg/dL — ABNORMAL HIGH (ref 70–99)
Potassium: 4.9 mmol/L (ref 3.5–5.1)
Sodium: 136 mmol/L (ref 135–145)
Total Bilirubin: 0.2 mg/dL — ABNORMAL LOW (ref 0.3–1.2)
Total Protein: 8.5 g/dL — ABNORMAL HIGH (ref 6.5–8.1)

## 2018-08-17 LAB — CBC WITH DIFFERENTIAL/PLATELET
Abs Immature Granulocytes: 0.04 10*3/uL (ref 0.00–0.07)
Basophils Absolute: 0 10*3/uL (ref 0.0–0.1)
Basophils Relative: 0 %
Eosinophils Absolute: 0 10*3/uL (ref 0.0–0.5)
Eosinophils Relative: 0 %
HCT: 37.3 % — ABNORMAL LOW (ref 39.0–52.0)
Hemoglobin: 11.5 g/dL — ABNORMAL LOW (ref 13.0–17.0)
Immature Granulocytes: 1 %
Lymphocytes Relative: 9 %
Lymphs Abs: 0.7 10*3/uL (ref 0.7–4.0)
MCH: 29 pg (ref 26.0–34.0)
MCHC: 30.8 g/dL (ref 30.0–36.0)
MCV: 94 fL (ref 80.0–100.0)
Monocytes Absolute: 0.5 10*3/uL (ref 0.1–1.0)
Monocytes Relative: 6 %
Neutro Abs: 6.3 10*3/uL (ref 1.7–7.7)
Neutrophils Relative %: 84 %
Platelets: 283 10*3/uL (ref 150–400)
RBC: 3.97 MIL/uL — ABNORMAL LOW (ref 4.22–5.81)
RDW: 14 % (ref 11.5–15.5)
WBC: 7.5 10*3/uL (ref 4.0–10.5)
nRBC: 0 % (ref 0.0–0.2)

## 2018-08-17 LAB — URINALYSIS, ROUTINE W REFLEX MICROSCOPIC
Bilirubin Urine: NEGATIVE
Glucose, UA: >=500 mg/dL — AB
Ketones, ur: NEGATIVE mg/dL
Leukocytes, UA: NEGATIVE
Nitrite: NEGATIVE
Protein, ur: 30 mg/dL — AB
Specific Gravity, Urine: 1.01 (ref 1.005–1.030)
pH: 5 (ref 5.0–8.0)

## 2018-08-17 LAB — CBG MONITORING, ED
Glucose-Capillary: 163 mg/dL — ABNORMAL HIGH (ref 70–99)
Glucose-Capillary: 245 mg/dL — ABNORMAL HIGH (ref 70–99)

## 2018-08-17 NOTE — ED Provider Notes (Addendum)
[facility] DEPT Provider Note   CSN: [id number] Arrival date & time: 08/17/18  0849     History   Chief Complaint Chief Complaint  Patient presents with  . Hypoglycemia    HPI [name] [name] is a 72 y.o. male.  HPI  Presents with hypoglycemia Daughter went to check on him and found him barely responsive this AM Checked his sugar and found it to be 20. He was too sleepy to eat so she called EMS. Was 50 for EMS and they gave him glucagon. Pt now reports he feels at baseline, denies concerns. His daughter gave him insulin last night however pt reports he forgot she gave it to him and he took additional insulin after eating a hot dog. Reports he took it latera last night Denies recent illness  Using Novilin 70-30, 5-20U, as that is what they can afford. Do not have lantus or other.   Daughter reports she believes her siblings encouraged him to take insulin not knowing that she had already given it to them. Past Medical History:  Diagnosis Date  . 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 Closed comminuted intertrochanteric fracture of left femur ([place])   . Diabetes mellitus without complication ([place])   . Diabetic hyperosmolar non-ketotic state ([place]) 05/15/2016  . DKA (diabetic ketoacidoses) ([place]) 05/09/2016  . Hypertension   . Postoperative anemia due to acute blood loss 06/18/2016  . Retroperitoneal hematoma 06/18/2016  . Stroke ([place])   . Vitamin B 12 deficiency 06/18/2016    Patient Active Problem List   Diagnosis Date Noted  . Underweight 11/18/2017  . Normocytic anemia 11/18/2017  . DKA, type 2 ([place]) 08/01/2017  . History of cerebrovascular accident (CVA) in adulthood 07/25/2017  . HTN (hypertension) 07/25/2017  . Depression with anxiety 07/25/2017  . Left ventricular diastolic dysfunction 76/72/0947  . Dementia ([place]) 07/25/2017  . 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 Diabetes mellitus,  insulin dependent (IDDM), controlled ([place]) 06/27/2017  . Hypertension 06/27/2017  . Hyperkalemia 01/16/2017  . Acute renal failure superimposed on stage 3 chronic kidney disease ([place])   . Noncompliance with medication regimen 08/17/2016  . History of DVT (deep vein thrombosis) 07/06/2016  . Vitamin B 12 deficiency 06/18/2016  . Hyperglycemia 05/15/2016  . Hyponatremia 05/15/2016  . Anxiety 05/15/2016  . Narcotic dependency, continuous ([place]) 11/02/2015  . Uncontrolled type 2 diabetes mellitus with diabetic nephropathy, with long-term current use of insulin ([place]) 08/12/2015  . Depression 08/12/2015  . CKD (chronic kidney disease) stage 3, GFR 30-59 ml/min ([name]) 03/20/2014    Past Surgical History:  Procedure Laterality Date  . HIP ARTHROPLASTY Right 02/05/2013   Procedure: ARTHROPLASTY BIPOLAR HIP;  Surgeon: [name], MD;  Location: [facility];  Service: Orthopedics;  Laterality: Right;  . INTRAMEDULLARY (IM) NAIL INTERTROCHANTERIC Left 06/16/2016   Procedure: INTRAMEDULLARY (IM) NAIL INTERTROCHANTRIC;  Surgeon: Rod Can, MD;  Location: [facility];  Service: Orthopedics;  Laterality: Left;        Home Medications    Prior to Admission medications   Medication Sig Start Date End Date Taking? Authorizing Provider  amLODipine (NORVASC) 10 MG tablet Take 1 tablet (10 mg total) by mouth daily. 05/25/18  Yes Azzie Glatter, [name]  aspirin 81 MG EC tablet Take 1 tablet (81 mg total) by mouth daily. 05/25/18  Yes Azzie Glatter, [name]  FLUoxetine (PROZAC) 40 MG capsule Take 1 capsule (40 mg total) by mouth daily. 05/25/18  Yes Azzie Glatter, [name]  insulin NPH-regular Human (70-30) 100 UNIT/ML injection Inject 5-20 Units into the skin See admin instructions. Checks blood sugar 3-4 times daily and administers insulin based on readings   Yes [provider]  glucagon (GLUCAGON EMERGENCY) 1 MG injection Inject 1 mg into the vein once as needed for up to 1 dose. Patient not taking:  Reported on 08/17/2018 05/25/18   Azzie Glatter, [name]  glucose blood (TRUE METRIX BLOOD GLUCOSE TEST) test strip CHECK BLOOD SUGAR UP TO 4 TIMES A DAY. 05/25/18   Azzie Glatter, [name]  insulin aspart (NOVOLOG) 100 UNIT/ML FlexPen Inject 3 Units into the skin 3 (three) times daily with meals. For blood sugars greater than 150 only Patient not taking: Reported on 08/17/2018 05/25/18   Azzie Glatter, [name]  Insulin Glargine (LANTUS SOLOSTAR) 100 UNIT/ML Solostar Pen Inject 5 Units into the skin daily at 10 pm. Patient not taking: Reported on 08/17/2018 05/25/18   Azzie Glatter, [name]    Family History Family History  Problem Relation Age of Onset  . Diabetes Mother   . Alzheimer's disease Mother   . Hypertension Mother   . Hyperlipidemia Father   . Hypertension Father   . Healthy Maternal Grandmother   . Pneumonia Maternal Grandfather     Social History Social History   Tobacco Use  . Smoking status: Current Some Day Smoker    Packs/day: 0.25    Years: 30.00    Pack years: 7.50    Types: Cigarettes    Last attempt to quit: 01/31/2013    Years since quitting: 5.5  . Smokeless tobacco: Never Used  . Tobacco comment: "some days I light one", requests a nicotine patch  Substance Use Topics  . Alcohol use: No  . Drug use: No     Allergies   Patient has no known allergies.   Review of Systems Review of Systems  Constitutional: Negative for fever.  HENT: Negative for sore throat.   Eyes: Negative for visual disturbance.  Respiratory: Negative for shortness of breath.   Cardiovascular: Negative for chest pain.  Gastrointestinal: Negative for abdominal pain.  Genitourinary: Negative for difficulty urinating.  Musculoskeletal: Negative for back pain and neck stiffness.  Skin: Negative for rash.  Neurological: Negative for syncope and headaches.     Physical Exam Updated Vital Signs BP (!) 151/83   Pulse 62   Temp 97.6 F (36.4 C) (Oral)   Resp 18   Ht 5\' 7"  (1.702  m)   Wt 65.8 kg   SpO2 100%   BMI 22.71 kg/m   Physical Exam  Constitutional: He is oriented to person, place, and time. He appears well-developed and well-nourished. No distress.  HENT:  Head: Normocephalic and atraumatic.  Eyes: Conjunctivae and EOM are normal.  Neck: Normal range of motion.  Cardiovascular: Normal rate, regular rhythm, normal heart sounds and intact distal pulses. Exam reveals no gallop and no friction rub.  No murmur heard. Pulmonary/Chest: Effort normal and breath sounds normal. No respiratory distress. He has no wheezes. He has no rales.  Abdominal: Soft. He exhibits no distension. There is no tenderness. There is no guarding.  Musculoskeletal: He exhibits no edema.  Neurological: He is alert and oriented to person, place, and time.  Skin: Skin is warm and dry. He is not diaphoretic.  Nursing note and vitals reviewed.    ED Treatments / Results  Labs (all labs ordered are listed, but only abnormal results are displayed)  Labs Reviewed  CBC WITH DIFFERENTIAL/PLATELET - Abnormal; Notable for the following components:      Result Value   RBC 3.97 (*)    Hemoglobin 11.5 (*)    HCT 37.3 (*)    All other components within normal limits  COMPREHENSIVE METABOLIC PANEL - Abnormal; Notable for the following components:   Glucose, Bld 254 (*)    BUN 53 (*)    Creatinine, Ser 1.87 (*)    Total Protein 8.5 (*)    Total Bilirubin 0.2 (*)    GFR calc non Af Amer 34 (*)    GFR calc Af Amer 39 (*)    All other components within normal limits  URINALYSIS, ROUTINE W REFLEX MICROSCOPIC - Abnormal; Notable for the following components:   Color, Urine STRAW (*)    Glucose, UA >=500 (*)    Hgb urine dipstick SMALL (*)    Protein, ur 30 (*)    Bacteria, UA RARE (*)    All other components within normal limits  CBG MONITORING, ED - Abnormal; Notable for the following components:   Glucose-Capillary 163 (*)    All other components within normal limits  CBG MONITORING,  ED - Abnormal; Notable for the following components:   Glucose-Capillary 245 (*)    All other components within normal limits    EKG None  Radiology No results found.  Procedures Procedures (including critical care time)  Medications Ordered in ED Medications - No data to display   Initial Impression / Assessment and Plan / ED Course  I have reviewed the triage vital signs and the nursing notes.  Pertinent labs & imaging results that were available during my care of the patient were reviewed by me and considered in my medical decision making (see chart for details).     72yo male with history above presents with concern for hypoglycemia.  Patient was hypoglycemic prior to arrival, received glucagon With EMS, and has anormal glucose on arrival to the emergency department.  He was given food and able to maintain his glucose on recheck.  Renal function looks slightly worsened, however no significant changes to indicate admission.  Recommend continued close follow-up as an outpatient.  Suspect likely etiology of his hypoglycemia and inappropriate medication administration, with patient's taking another dose of insulin after his daughter had already given him some.  No signs of infection or other etiology of hypoglycemia.    Consulted case management and pt declined help at home.  Daughter states understanding. Patient discharged in stable condition with understanding of reasons to return.   Final Clinical Impressions(s) / ED Diagnoses   Final diagnoses:  Hypoglycemia    ED Discharge Orders    None       Gareth Morgan, MD 08/17/18 [id number]    Gareth Morgan, MD 08/17/18 [id number]

## 2018-08-17 NOTE — Care Management Note (Signed)
Case Management Note  CM consulted for assistance with medication.  Dr. Billy Fischer reports that pt is having trouble affording all of his medications.  CM spoke with pt and daughter at bedside.  Pt refused HH, refused information about medicaid, refused having a provider come to his home instead of going to them, pt refused all information.  Pt's daughter is aware of the suggestion to file for medicaid and said she would follow up.  She also advised she would get the pt back on silver scripts.  Updated Dr. Billy Fischer and Ronalee Belts, RN.  No further CM needs noted at this time.  Evagelia Knack, Benjaman Lobe, RN 08/17/2018, 12:27 PM

## 2018-08-17 NOTE — ED Triage Notes (Signed)
EMS reports from [facility], called out for Hypoglycemia, staff stated CBG 20, upon EMS arrival CBG of 32. Pt displayed tremors. Denies pain.  Given 1mg  Glucagon IM to Left delt enroute, last CBG was 110 @ 0850  BP 160//80 HR 74 RR 22 Sp02 97 RA

## 2018-08-17 NOTE — ED Notes (Signed)
Bed: SU19 Expected date: 08/17/18 Expected time: 8:48 AM Means of arrival: Ambulance Comments:

## 2018-08-25 ENCOUNTER — Ambulatory Visit: Payer: Medicare Other | Admitting: Family Medicine

## 2018-08-28 IMAGING — US US RENAL
1 series · 14 of 25 positions shown · non-contrast
Comparison: Abdomen and pelvis CT dated 07/06/2016.

CLINICAL DATA: Acute renal failure.  Diabetes.  Hypertension.

EXAM:
RENAL / URINARY TRACT ULTRASOUND COMPLETE

[Series 1: us renal · 0.26mm/px · 14 of 35 slices shown]
[im 1/35]
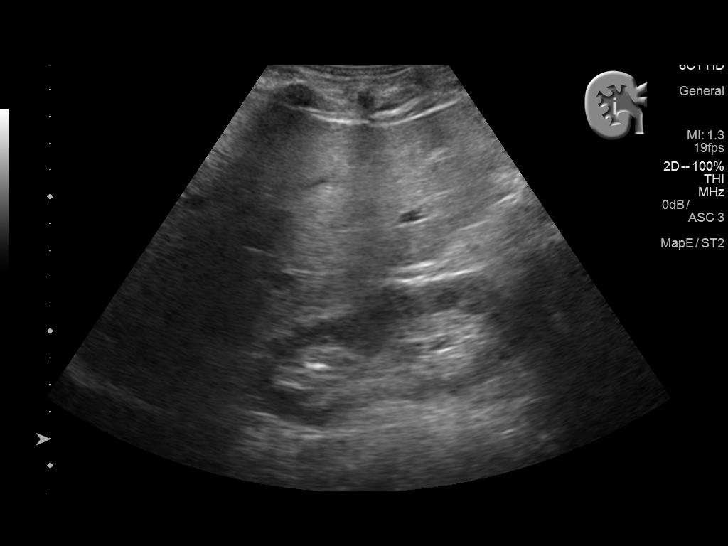
[im 3/35]
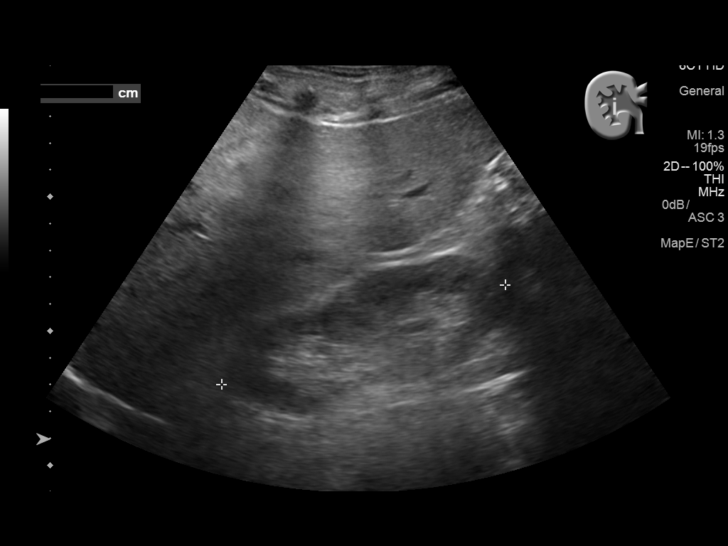
[im 6/35]
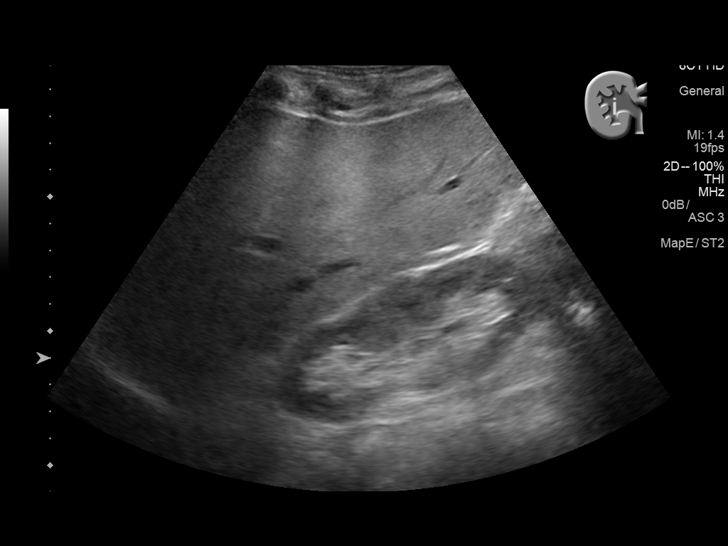
[im 9/35]
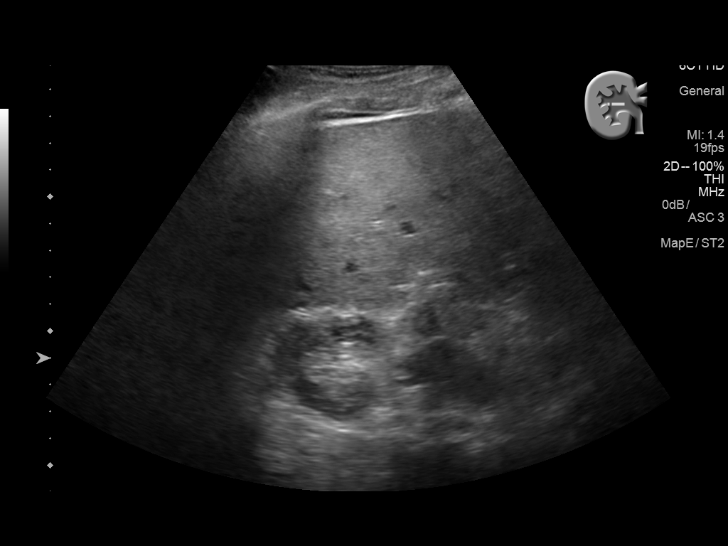
[im 12/35]
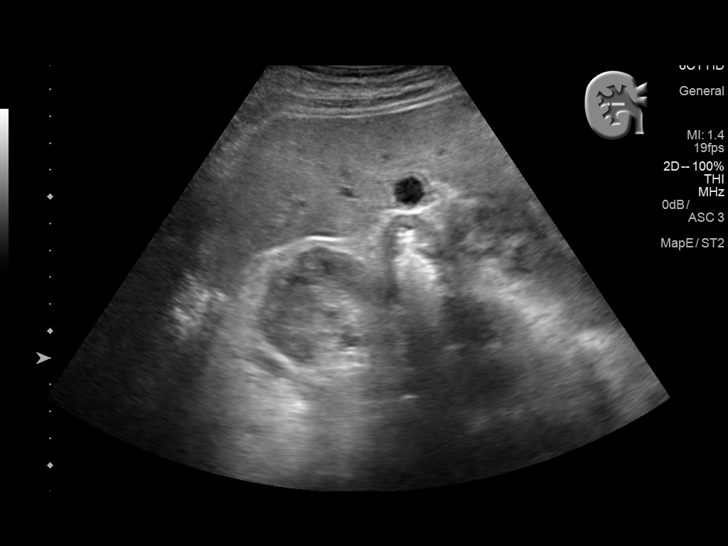
[im 13/35]
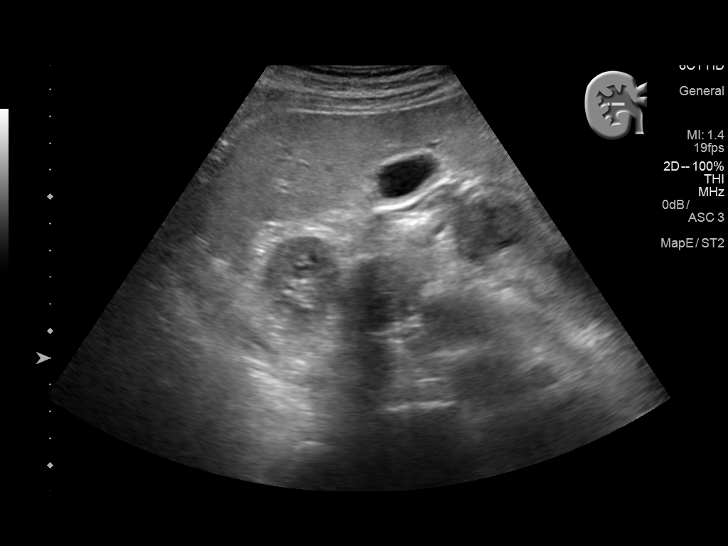
[im 16/35]
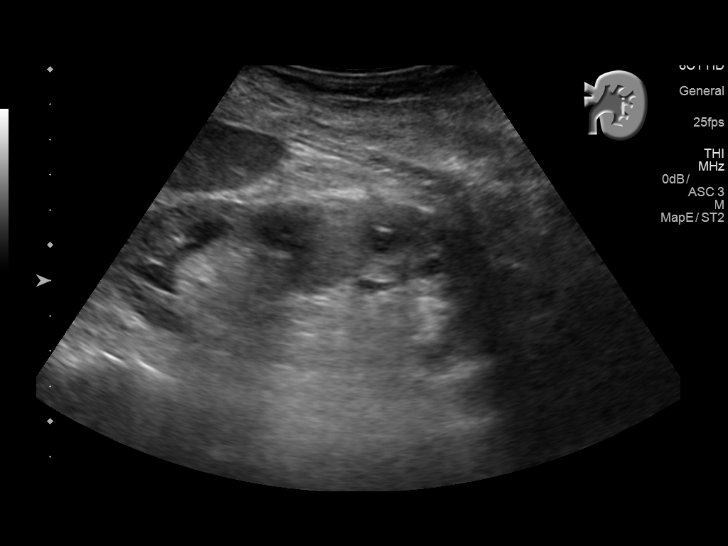
[im 19/35]
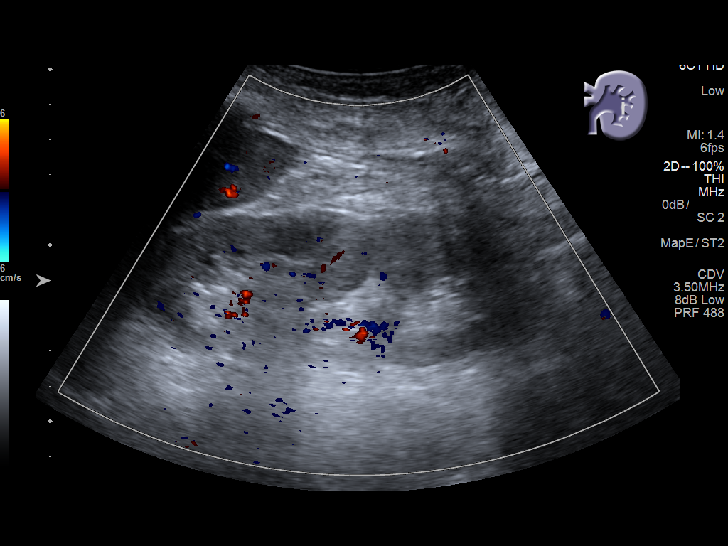
[im 22/35]
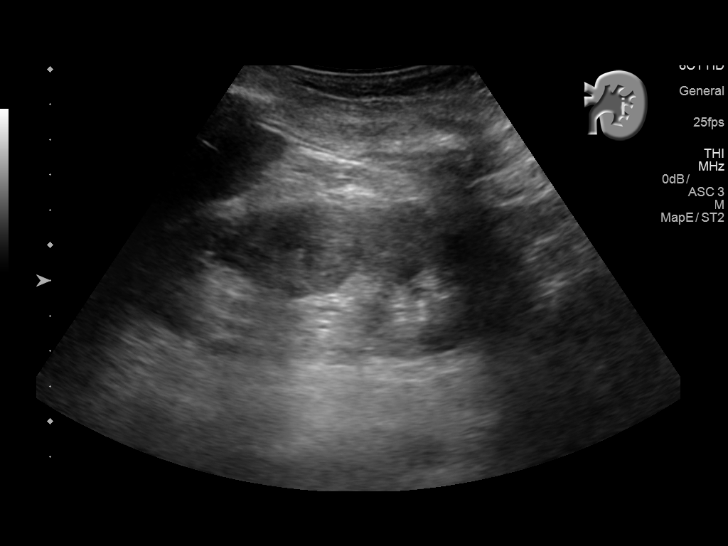
[im 23/35]
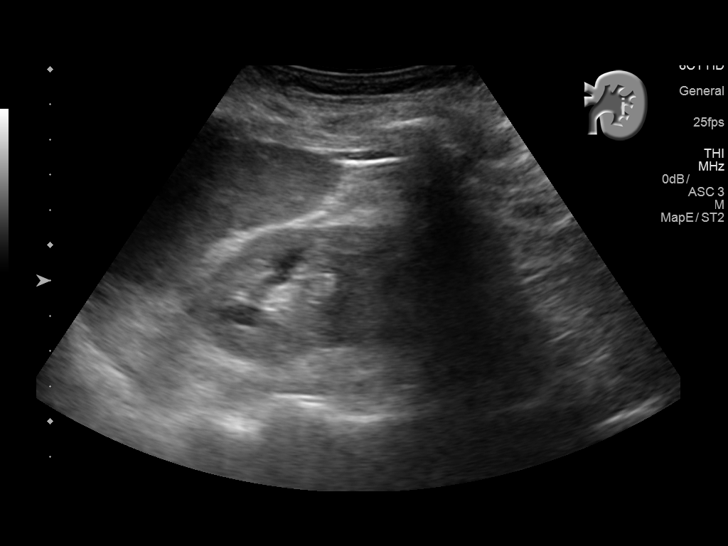
[im 26/35]
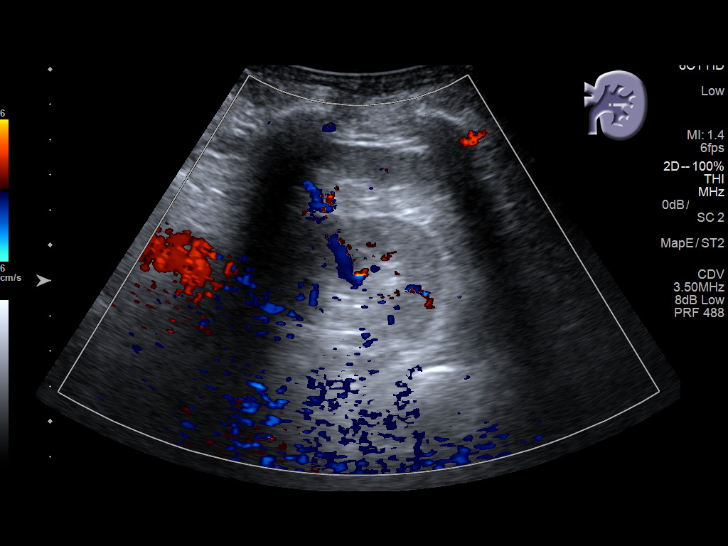
[im 29/35]
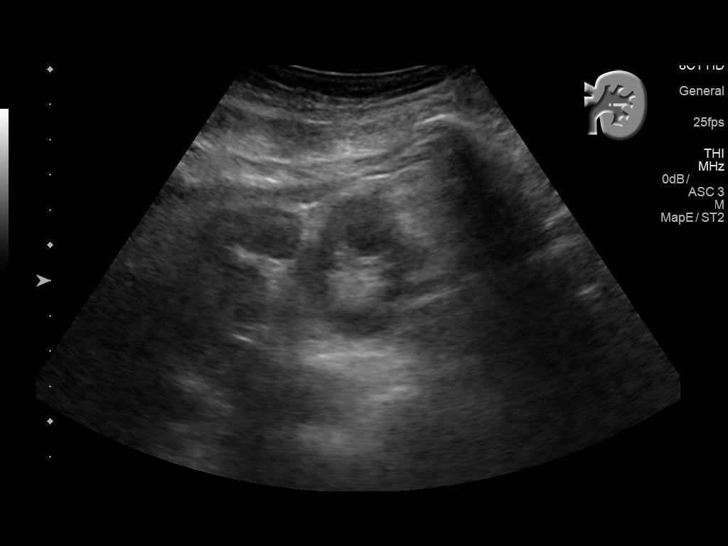
[im 32/35]
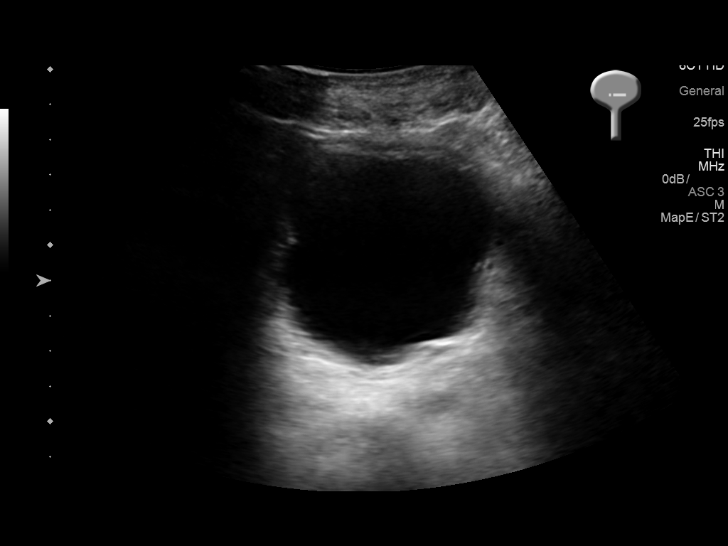
[im 35/35]
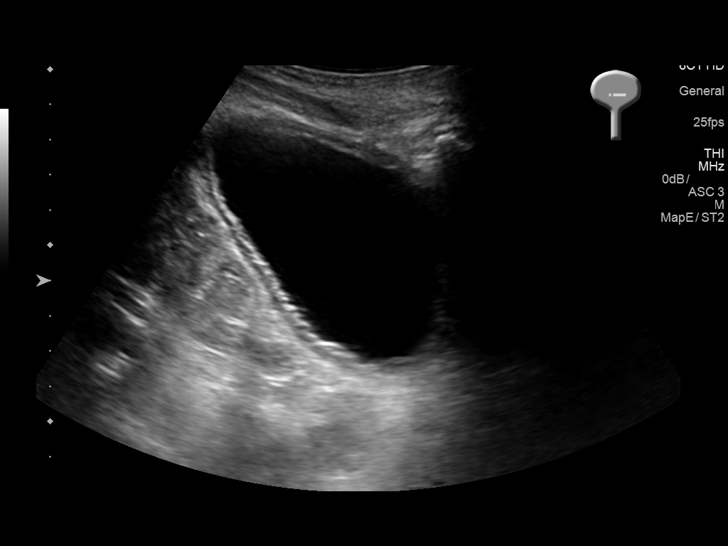

[14 of 25 positions shown; findings below may reference images not displayed]

FINDINGS: Right Kidney:

Length: 11.2 cm. Echogenicity within normal limits. No mass or
hydronephrosis visualized.

Left Kidney:

Length: 11.0 cm. Echogenicity within normal limits. No mass or
hydronephrosis visualized.

Bladder:

Appears normal for degree of bladder distention.
IMPRESSION: Normal examination.

## 2018-09-03 IMAGING — DX DG CHEST 1V PORT
1 series · 1 of 1 positions shown · non-contrast
Comparison: 04/27/2017 and prior radiographs

CLINICAL DATA: 71-year-old male - evaluate source of infection.

EXAM:
PORTABLE CHEST 1 VIEW

[chest ap]
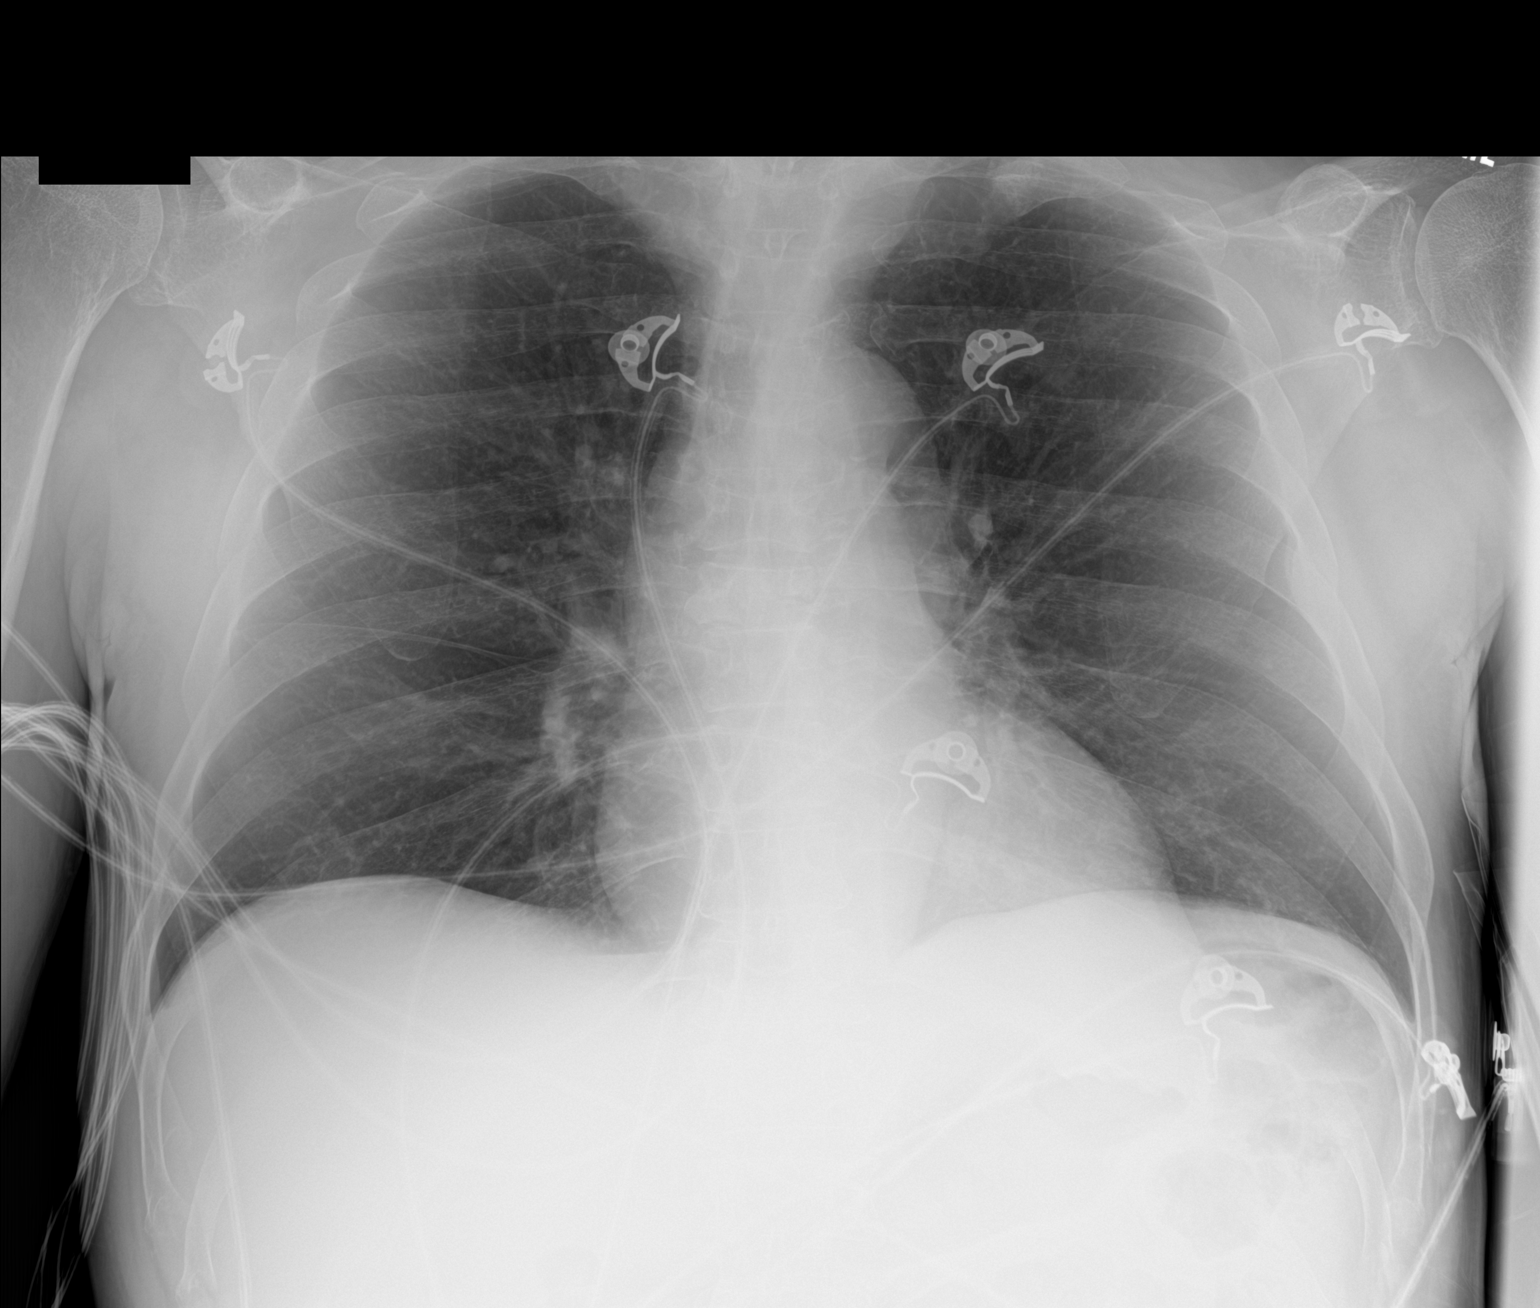

[1 of 1 positions shown; findings below may reference images not displayed]

FINDINGS: The cardiomediastinal silhouette is unremarkable.

There is no evidence of focal airspace disease, pulmonary edema,
suspicious pulmonary nodule/mass, pleural effusion, or pneumothorax.

No acute bony abnormalities are identified.
IMPRESSION: No active disease.

## 2018-09-20 ENCOUNTER — Other Ambulatory Visit: Payer: Self-pay | Admitting: Family Medicine

## 2018-09-20 DIAGNOSIS — Z794 Long term (current) use of insulin: Principal | ICD-10-CM

## 2018-09-20 DIAGNOSIS — E119 Type 2 diabetes mellitus without complications: Secondary | ICD-10-CM

## 2018-09-22 ENCOUNTER — Other Ambulatory Visit: Payer: Self-pay | Admitting: Family Medicine

## 2018-09-22 DIAGNOSIS — Z794 Long term (current) use of insulin: Principal | ICD-10-CM

## 2018-09-22 DIAGNOSIS — E119 Type 2 diabetes mellitus without complications: Secondary | ICD-10-CM

## 2018-10-01 IMAGING — DX DG CHEST 2V
2 series · 2 of 2 positions shown · non-contrast
Comparison: Chest x-ray dated June 27, 2017.

CLINICAL DATA: Diabetic ketoacidosis.

EXAM:
CHEST  2 VIEW

[x chest ap]
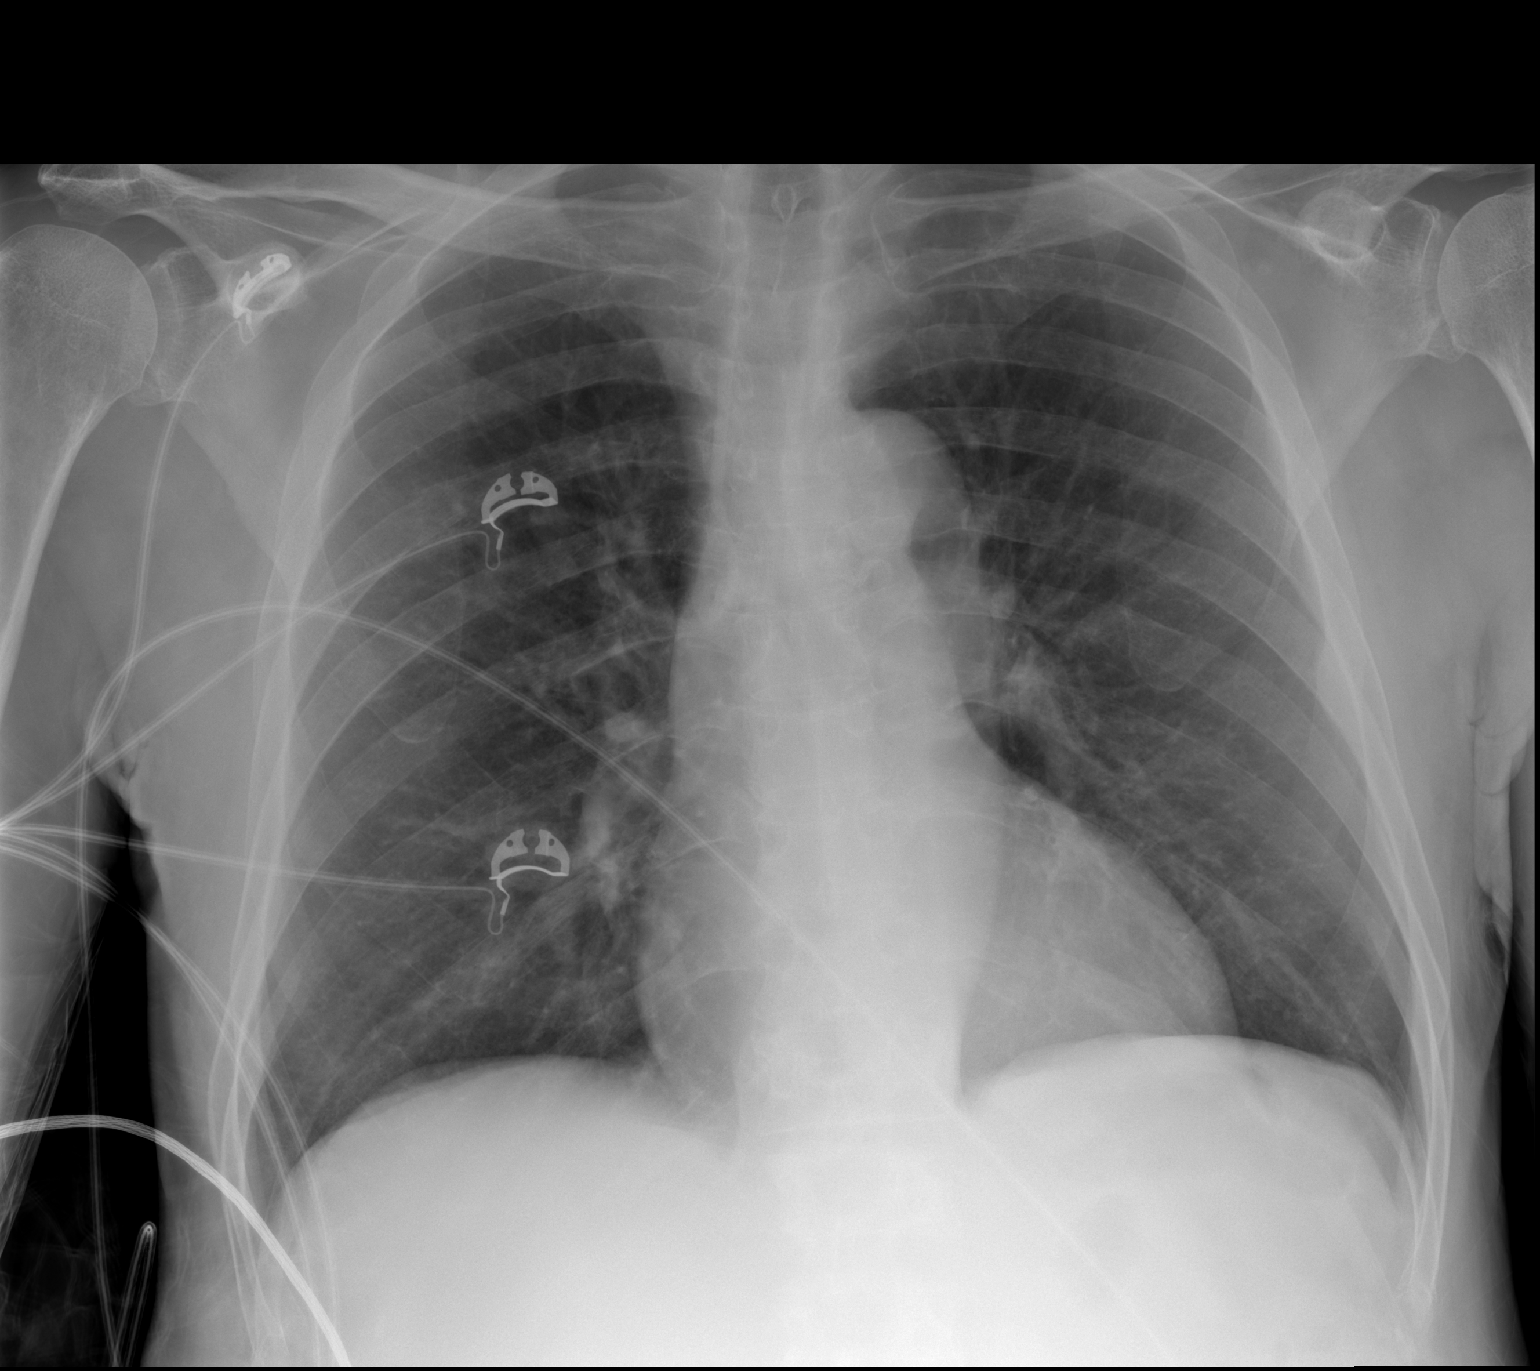

[w chest lat]
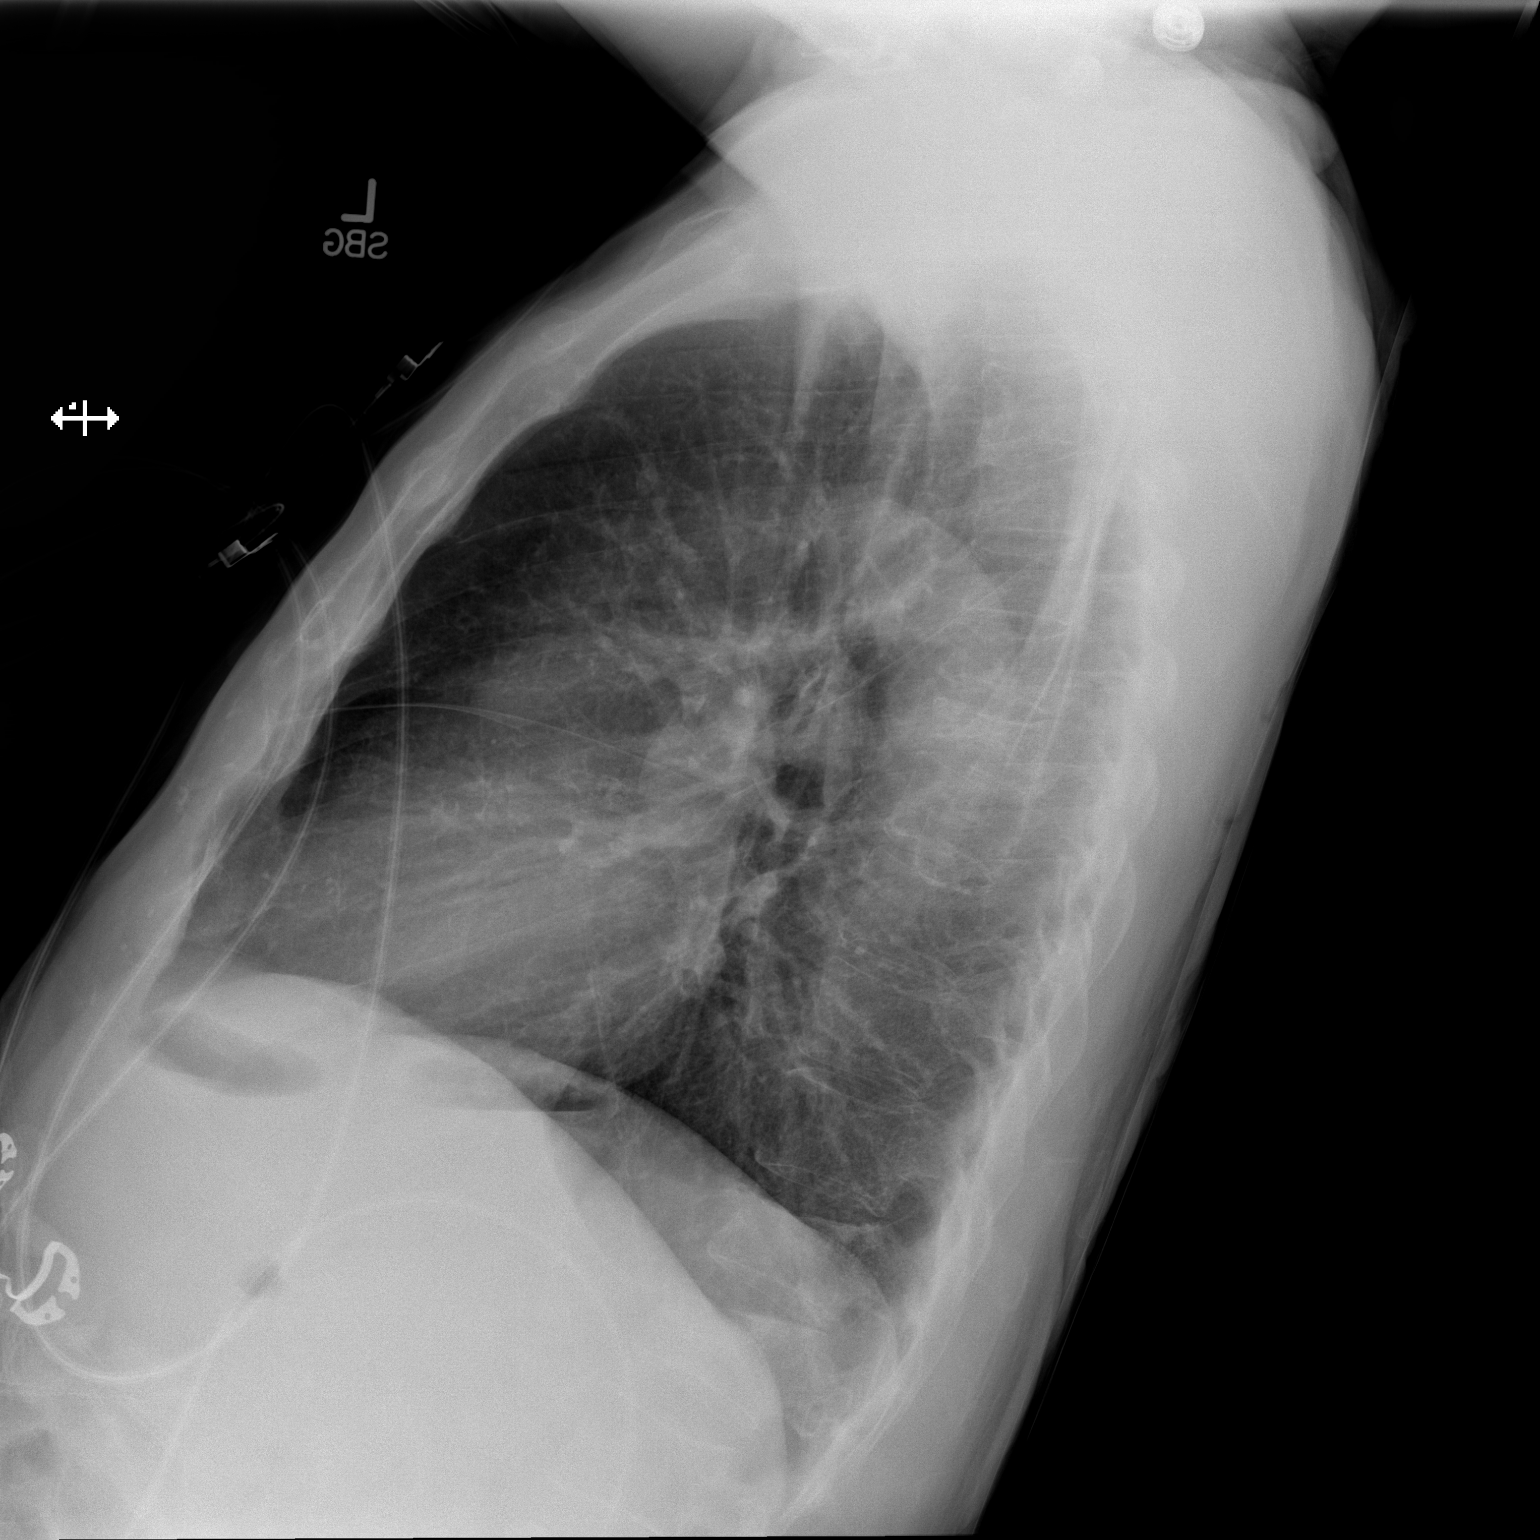

[2 of 2 positions shown; findings below may reference images not displayed]

FINDINGS: The cardiomediastinal silhouette is normal in size. Normal pulmonary
vascularity. No focal consolidation, pleural effusion, or
pneumothorax. Unchanged compression deformities of T6 and T7. No
acute osseous abnormality.
IMPRESSION: No active cardiopulmonary disease.

## 2018-10-02 ENCOUNTER — Observation Stay (HOSPITAL_COMMUNITY)
Admission: EM | Admit: 2018-10-02 | Discharge: 2018-10-04 | Disposition: A | Discharge status: Home / Self Care | Payer: Medicare Other | Attending: Internal Medicine | Admitting: Internal Medicine

## 2018-10-02 ENCOUNTER — Encounter (HOSPITAL_COMMUNITY): Payer: Self-pay

## 2018-10-02 ENCOUNTER — Emergency Department (HOSPITAL_COMMUNITY): Payer: Medicare Other

## 2018-10-02 DIAGNOSIS — R51 Headache: Secondary | ICD-10-CM | POA: Insufficient documentation

## 2018-10-02 DIAGNOSIS — D649 Anemia, unspecified: Secondary | ICD-10-CM | POA: Diagnosis not present

## 2018-10-02 DIAGNOSIS — Z79899 Other long term (current) drug therapy: Secondary | ICD-10-CM | POA: Diagnosis not present

## 2018-10-02 DIAGNOSIS — E1122 Type 2 diabetes mellitus with diabetic chronic kidney disease: Secondary | ICD-10-CM | POA: Diagnosis not present

## 2018-10-02 DIAGNOSIS — R079 Chest pain, unspecified: Secondary | ICD-10-CM

## 2018-10-02 DIAGNOSIS — I152 Hypertension secondary to endocrine disorders: Secondary | ICD-10-CM | POA: Diagnosis present

## 2018-10-02 DIAGNOSIS — M542 Cervicalgia: Secondary | ICD-10-CM | POA: Diagnosis not present

## 2018-10-02 DIAGNOSIS — Z8673 Personal history of transient ischemic attack (TIA), and cerebral infarction without residual deficits: Secondary | ICD-10-CM

## 2018-10-02 DIAGNOSIS — R2681 Unsteadiness on feet: Secondary | ICD-10-CM | POA: Diagnosis not present

## 2018-10-02 DIAGNOSIS — N179 Acute kidney failure, unspecified: Secondary | ICD-10-CM

## 2018-10-02 DIAGNOSIS — I10 Essential (primary) hypertension: Secondary | ICD-10-CM | POA: Diagnosis present

## 2018-10-02 DIAGNOSIS — W19XXXA Unspecified fall, initial encounter: Secondary | ICD-10-CM

## 2018-10-02 DIAGNOSIS — F17210 Nicotine dependence, cigarettes, uncomplicated: Secondary | ICD-10-CM | POA: Diagnosis not present

## 2018-10-02 DIAGNOSIS — R739 Hyperglycemia, unspecified: Secondary | ICD-10-CM | POA: Diagnosis present

## 2018-10-02 DIAGNOSIS — S0990XA Unspecified injury of head, initial encounter: Secondary | ICD-10-CM | POA: Diagnosis not present

## 2018-10-02 DIAGNOSIS — Z7982 Long term (current) use of aspirin: Secondary | ICD-10-CM | POA: Insufficient documentation

## 2018-10-02 DIAGNOSIS — E875 Hyperkalemia: Secondary | ICD-10-CM | POA: Diagnosis present

## 2018-10-02 DIAGNOSIS — S20219A Contusion of unspecified front wall of thorax, initial encounter: Secondary | ICD-10-CM | POA: Diagnosis not present

## 2018-10-02 DIAGNOSIS — Z794 Long term (current) use of insulin: Secondary | ICD-10-CM | POA: Insufficient documentation

## 2018-10-02 DIAGNOSIS — E871 Hypo-osmolality and hyponatremia: Secondary | ICD-10-CM | POA: Diagnosis present

## 2018-10-02 DIAGNOSIS — S199XXA Unspecified injury of neck, initial encounter: Secondary | ICD-10-CM | POA: Diagnosis not present

## 2018-10-02 DIAGNOSIS — Z9181 History of falling: Secondary | ICD-10-CM | POA: Diagnosis not present

## 2018-10-02 DIAGNOSIS — N183 Chronic kidney disease, stage 3 (moderate): Secondary | ICD-10-CM | POA: Insufficient documentation

## 2018-10-02 DIAGNOSIS — M6281 Muscle weakness (generalized): Secondary | ICD-10-CM | POA: Diagnosis not present

## 2018-10-02 DIAGNOSIS — I129 Hypertensive chronic kidney disease with stage 1 through stage 4 chronic kidney disease, or unspecified chronic kidney disease: Secondary | ICD-10-CM | POA: Insufficient documentation

## 2018-10-02 DIAGNOSIS — S0993XA Unspecified injury of face, initial encounter: Secondary | ICD-10-CM | POA: Diagnosis not present

## 2018-10-02 DIAGNOSIS — Y92009 Unspecified place in unspecified non-institutional (private) residence as the place of occurrence of the external cause: Secondary | ICD-10-CM

## 2018-10-02 DIAGNOSIS — E1165 Type 2 diabetes mellitus with hyperglycemia: Secondary | ICD-10-CM | POA: Diagnosis not present

## 2018-10-02 DIAGNOSIS — W010XXA Fall on same level from slipping, tripping and stumbling without subsequent striking against object, initial encounter: Secondary | ICD-10-CM | POA: Insufficient documentation

## 2018-10-02 DIAGNOSIS — Z23 Encounter for immunization: Secondary | ICD-10-CM | POA: Diagnosis not present

## 2018-10-02 DIAGNOSIS — E1159 Type 2 diabetes mellitus with other circulatory complications: Secondary | ICD-10-CM | POA: Diagnosis present

## 2018-10-02 DIAGNOSIS — I444 Left anterior fascicular block: Secondary | ICD-10-CM | POA: Diagnosis not present

## 2018-10-02 LAB — CBG MONITORING, ED: Glucose-Capillary: 557 mg/dL (ref 70–99)

## 2018-10-02 MED ORDER — INSULIN ASPART 100 UNIT/ML ~~LOC~~ SOLN
5.0000 [IU] | Freq: Once | SUBCUTANEOUS | Status: AC
  Administered 2018-10-03: 5 [IU] via SUBCUTANEOUS
  Filled 2018-10-02: qty 1

## 2018-10-02 NOTE — ED Provider Notes (Signed)
[facility] DEPT Provider Note   CSN: [id number] Arrival date & time: 10/02/18  2308     History   Chief Complaint Chief Complaint  Patient presents with  . Fall  . Hyperglycemia    HPI [name] [name] is a 72 y.o. male.  The history is provided by the patient.  Fall   Hyperglycemia  He has history of hypertension, diabetes, stroke, chronic kidney disease and comes in after falling at home.  He states that he tripped and hit the left side of his face against the wall.  He denies loss of consciousness.  He denies visual change, nausea, vomiting.  He denies other injury.  He was brought by ambulance.  EMS noted elevated capillary blood glucose of 556.  Patient does state that his home glucose readings have been high recently.  Past Medical History:  Diagnosis Date  . 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 Closed comminuted intertrochanteric fracture of left femur ([place])   . Diabetes mellitus without complication ([place])   . Diabetic hyperosmolar non-ketotic state ([place]) 05/15/2016  . DKA (diabetic ketoacidoses) ([place]) 05/09/2016  . Hypertension   . Postoperative anemia due to acute blood loss 06/18/2016  . Retroperitoneal hematoma 06/18/2016  . Stroke ([place])   . Vitamin B 12 deficiency 06/18/2016    Patient Active Problem List   Diagnosis Date Noted  . Underweight 11/18/2017  . Normocytic anemia 11/18/2017  . DKA, type 2 ([place]) 08/01/2017  . History of cerebrovascular accident (CVA) in adulthood 07/25/2017  . HTN (hypertension) 07/25/2017  . Depression with anxiety 07/25/2017  . Left ventricular diastolic dysfunction 33/82/5053  . Dementia ([place]) 07/25/2017  . 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 Diabetes mellitus, insulin dependent (IDDM), controlled ([place]) 06/27/2017  . Hypertension 06/27/2017  . Hyperkalemia 01/16/2017  . Acute renal failure superimposed on stage 3 chronic kidney disease  ([place])   . Noncompliance with medication regimen 08/17/2016  . History of DVT (deep vein thrombosis) 07/06/2016  . Vitamin B 12 deficiency 06/18/2016  . Hyperglycemia 05/15/2016  . Hyponatremia 05/15/2016  . Anxiety 05/15/2016  . Narcotic dependency, continuous ([place]) 11/02/2015  . Uncontrolled type 2 diabetes mellitus with diabetic nephropathy, with long-term current use of insulin ([place]) 08/12/2015  . Depression 08/12/2015  . CKD (chronic kidney disease) stage 3, GFR 30-59 ml/min ([name]) 03/20/2014    Past Surgical History:  Procedure Laterality Date  . HIP ARTHROPLASTY Right 02/05/2013   Procedure: ARTHROPLASTY BIPOLAR HIP;  Surgeon: [name], MD;  Location: [facility];  Service: Orthopedics;  Laterality: Right;  . INTRAMEDULLARY (IM) NAIL INTERTROCHANTERIC Left 06/16/2016   Procedure: INTRAMEDULLARY (IM) NAIL INTERTROCHANTRIC;  Surgeon: Rod Can, MD;  Location: [facility];  Service: Orthopedics;  Laterality: Left;        Home Medications    Prior to Admission medications   Medication Sig Start Date End Date Taking? Authorizing Provider  amLODipine (NORVASC) 10 MG tablet Take 1 tablet (10 mg total) by mouth daily. 05/25/18   Azzie Glatter, [name]  aspirin 81 MG EC tablet Take 1 tablet (81 mg total) by mouth daily. 05/25/18   Azzie Glatter, [name]  FLUoxetine (PROZAC) 40 MG capsule Take 1 capsule (40 mg total) by mouth daily. 05/25/18   Azzie Glatter, [name]  glucagon (GLUCAGON EMERGENCY) 1 MG injection Inject 1 mg into the vein once as needed for up to 1 dose. Patient not taking: Reported on 08/17/2018 05/25/18   Clovis Riley,  Ellie Lunch, [name]  glucose blood (TRUE METRIX BLOOD GLUCOSE TEST) test strip CHECK BLOOD SUGAR UP TO 4 TIMES A DAY. 05/25/18   Azzie Glatter, [name]  insulin aspart (NOVOLOG) 100 UNIT/ML FlexPen Inject 3 Units into the skin 3 (three) times daily with meals. For blood sugars greater than 150 only Patient not taking: Reported on 08/17/2018 05/25/18   Azzie Glatter, [name]    Insulin Glargine (LANTUS SOLOSTAR) 100 UNIT/ML Solostar Pen Inject 5 Units into the skin daily at 10 pm. Patient not taking: Reported on 08/17/2018 05/25/18   Azzie Glatter, [name]  insulin NPH-regular Human (70-30) 100 UNIT/ML injection Inject 5-20 Units into the skin See admin instructions. Checks blood sugar 3-4 times daily and administers insulin based on readings    [provider]    Family History Family History  Problem Relation Age of Onset  . Diabetes Mother   . Alzheimer's disease Mother   . Hypertension Mother   . Hyperlipidemia Father   . Hypertension Father   . Healthy Maternal Grandmother   . Pneumonia Maternal Grandfather     Social History Social History   Tobacco Use  . Smoking status: Current Some Day Smoker    Packs/day: 0.25    Years: 30.00    Pack years: 7.50    Types: Cigarettes    Last attempt to quit: 01/31/2013    Years since quitting: 5.6  . Smokeless tobacco: Never Used  . Tobacco comment: "some days I light one", requests a nicotine patch  Substance Use Topics  . Alcohol use: No  . Drug use: No     Allergies   Patient has no known allergies.   Review of Systems Review of Systems  All other systems reviewed and are negative.    Physical Exam Updated Vital Signs BP (!) 147/76 (BP Location: Left Arm)   Pulse 68   Temp 97.7 F (36.5 C) (Oral)   Resp 18   Ht 5\' 10"  (1.778 m)   Wt 66.2 kg   SpO2 99%   BMI 20.95 kg/m   Physical Exam Vitals signs and nursing note reviewed.    72 year old male, resting comfortably and in no acute distress. Vital signs are significant for mild elevation of systolic blood pressure. Oxygen saturation is 99%, which is normal. Head is normocephalic.  Mild soft tissue swelling noted to the left malar area without deformity.  There is tenderness over the same area. PERRLA, EOMI. Oropharynx is clear. Neck is immobilized in a stiff cervical collar.  There is mild tenderness over the cervical spine.   There is no adenopathy or JVD. Back is nontender and there is no CVA tenderness. Lungs are clear without rales, wheezes, or rhonchi. Chest is nontender. Heart has regular rate and rhythm without murmur. Abdomen is soft, flat, nontender without masses or hepatosplenomegaly and peristalsis is normoactive. Extremities have no cyanosis or edema, full range of motion is present. Skin is warm and dry without rash. Neurologic: Mental status is normal, cranial nerves are intact, there are no motor or sensory deficits.  ED Treatments / Results  Labs (all labs ordered are listed, but only abnormal results are displayed) Labs Reviewed  BASIC METABOLIC PANEL - Abnormal; Notable for the following components:      Result Value   Sodium 129 (*)    Potassium 5.8 (*)    Chloride 95 (*)    CO2 19 (*)    Glucose, Bld 519 (*)    BUN  60 (*)    Creatinine, Ser 2.48 (*)    GFR calc non Af Amer 25 (*)    GFR calc Af Amer 29 (*)    All other components within normal limits  CBC - Abnormal; Notable for the following components:   RBC 4.11 (*)    Hemoglobin 12.1 (*)    HCT 37.5 (*)    All other components within normal limits  BLOOD GAS, VENOUS - Abnormal; Notable for the following components:   pCO2, Ven 42.4 (*)    Acid-base deficit 4.8 (*)    All other components within normal limits  CBG MONITORING, ED - Abnormal; Notable for the following components:   Glucose-Capillary 557 (*)    All other components within normal limits  CBG MONITORING, ED - Abnormal; Notable for the following components:   Glucose-Capillary 279 (*)    All other components within normal limits  DIFFERENTIAL  CBC WITH DIFFERENTIAL/PLATELET  URINALYSIS, ROUTINE W REFLEX MICROSCOPIC  BASIC METABOLIC PANEL  TROPONIN I  CBG MONITORING, ED   Radiology Ct Head Wo Contrast  Result Date: 10/03/2018 CLINICAL DATA:  Fall, headache, facial and neck pain. History of stroke, hypertension, diabetes. EXAM: CT HEAD WITHOUT CONTRAST CT  MAXILLOFACIAL WITHOUT CONTRAST CT CERVICAL SPINE WITHOUT CONTRAST TECHNIQUE: Multidetector CT imaging of the head, cervical spine, and maxillofacial structures were performed using the standard protocol without intravenous contrast. Multiplanar CT image reconstructions of the cervical spine and maxillofacial structures were also generated. COMPARISON:  CT HEAD August 09, 2017 and CT cervical spine October 01, 2016 FINDINGS: CT HEAD FINDINGS BRAIN: No intraparenchymal hemorrhage, mass effect nor midline shift. The ventricles and sulci are normal for age. Confluent supratentorial white matter hypodensities. No acute large vascular territory infarcts. No abnormal extra-axial fluid collections. Basal cisterns are patent. VASCULAR: Moderate to severe calcific atherosclerosis of the carotid siphons. SKULL: No skull fracture. No significant scalp soft tissue swelling. OTHER: None. CT MAXILLOFACIAL FINDINGS OSSEOUS: No acute facial fracture. The mandible is intact, the condyles are located. No destructive bony lesions. Moderate LEFT temporomandibular osteoarthrosis. Patient is edentulous. ORBITS: Old LEFT orbital floor fracture with mild external herniation of extraconal fat. Ocular globes intact, lenses are located. Preservation orbital fat. SINUSES: Perforated nasal septum. Mild RIGHT sphenoid sinus mucosal thickening without air-fluid levels. Intact nasal septum is midline. Mastoid aircells are well aerated. SOFT TISSUES: No significant soft tissue swelling. No subcutaneous gas or radiopaque foreign bodies. CT CERVICAL SPINE FINDINGS ALIGNMENT: Straightened cervical lordosis. Minimal grade 1 C7-T1 anterolisthesis. SKULL BASE AND VERTEBRAE: Cervical vertebral bodies and posterior elements are intact. Mild old T2 compression fracture. Severe C3-4 through C6-7 disc height loss with endplate sclerosis and marginal spurring consistent with degenerative disc. Moderate RIGHT C2-3 facet arthropathy. SOFT TISSUES AND SPINAL  CANAL: Nonacute. Mild calcific atherosclerosis carotid siphon. DISC LEVELS: Moderate canal stenosis C5-6 and C6-7. Neural foraminal narrowing C3-4 through C7-T1 varying from moderate to severe. UPPER CHEST: Lung apices are clear. OTHER: None. IMPRESSION: CT HEAD: 1. No acute intracranial process. 2. Increased moderate chronic small vessel ischemic changes. CT MAXILLOFACIAL: 1. No acute facial fracture. CT CERVICAL SPINE: 1. No fracture.  Minimal grade 1 C7-T1 anterolisthesis. 2. Similar advanced spondylosis resulting in moderate canal stenosis C5-6 and C6-7, multi level neural foraminal narrowing. Electronically Signed   By: Elon Alas M.D.   On: 10/03/2018 00:35   Ct Cervical Spine Wo Contrast  Result Date: 10/03/2018 CLINICAL DATA:  Fall, headache, facial and neck pain. History of stroke, hypertension, diabetes. EXAM: CT  HEAD WITHOUT CONTRAST CT MAXILLOFACIAL WITHOUT CONTRAST CT CERVICAL SPINE WITHOUT CONTRAST TECHNIQUE: Multidetector CT imaging of the head, cervical spine, and maxillofacial structures were performed using the standard protocol without intravenous contrast. Multiplanar CT image reconstructions of the cervical spine and maxillofacial structures were also generated. COMPARISON:  CT HEAD August 09, 2017 and CT cervical spine October 01, 2016 FINDINGS: CT HEAD FINDINGS BRAIN: No intraparenchymal hemorrhage, mass effect nor midline shift. The ventricles and sulci are normal for age. Confluent supratentorial white matter hypodensities. No acute large vascular territory infarcts. No abnormal extra-axial fluid collections. Basal cisterns are patent. VASCULAR: Moderate to severe calcific atherosclerosis of the carotid siphons. SKULL: No skull fracture. No significant scalp soft tissue swelling. OTHER: None. CT MAXILLOFACIAL FINDINGS OSSEOUS: No acute facial fracture. The mandible is intact, the condyles are located. No destructive bony lesions. Moderate LEFT temporomandibular osteoarthrosis.  Patient is edentulous. ORBITS: Old LEFT orbital floor fracture with mild external herniation of extraconal fat. Ocular globes intact, lenses are located. Preservation orbital fat. SINUSES: Perforated nasal septum. Mild RIGHT sphenoid sinus mucosal thickening without air-fluid levels. Intact nasal septum is midline. Mastoid aircells are well aerated. SOFT TISSUES: No significant soft tissue swelling. No subcutaneous gas or radiopaque foreign bodies. CT CERVICAL SPINE FINDINGS ALIGNMENT: Straightened cervical lordosis. Minimal grade 1 C7-T1 anterolisthesis. SKULL BASE AND VERTEBRAE: Cervical vertebral bodies and posterior elements are intact. Mild old T2 compression fracture. Severe C3-4 through C6-7 disc height loss with endplate sclerosis and marginal spurring consistent with degenerative disc. Moderate RIGHT C2-3 facet arthropathy. SOFT TISSUES AND SPINAL CANAL: Nonacute. Mild calcific atherosclerosis carotid siphon. DISC LEVELS: Moderate canal stenosis C5-6 and C6-7. Neural foraminal narrowing C3-4 through C7-T1 varying from moderate to severe. UPPER CHEST: Lung apices are clear. OTHER: None. IMPRESSION: CT HEAD: 1. No acute intracranial process. 2. Increased moderate chronic small vessel ischemic changes. CT MAXILLOFACIAL: 1. No acute facial fracture. CT CERVICAL SPINE: 1. No fracture.  Minimal grade 1 C7-T1 anterolisthesis. 2. Similar advanced spondylosis resulting in moderate canal stenosis C5-6 and C6-7, multi level neural foraminal narrowing. Electronically Signed   By: Elon Alas M.D.   On: 10/03/2018 00:35   Ct Maxillofacial Wo Contrast  Result Date: 10/03/2018 CLINICAL DATA:  Fall, headache, facial and neck pain. History of stroke, hypertension, diabetes. EXAM: CT HEAD WITHOUT CONTRAST CT MAXILLOFACIAL WITHOUT CONTRAST CT CERVICAL SPINE WITHOUT CONTRAST TECHNIQUE: Multidetector CT imaging of the head, cervical spine, and maxillofacial structures were performed using the standard protocol  without intravenous contrast. Multiplanar CT image reconstructions of the cervical spine and maxillofacial structures were also generated. COMPARISON:  CT HEAD August 09, 2017 and CT cervical spine October 01, 2016 FINDINGS: CT HEAD FINDINGS BRAIN: No intraparenchymal hemorrhage, mass effect nor midline shift. The ventricles and sulci are normal for age. Confluent supratentorial white matter hypodensities. No acute large vascular territory infarcts. No abnormal extra-axial fluid collections. Basal cisterns are patent. VASCULAR: Moderate to severe calcific atherosclerosis of the carotid siphons. SKULL: No skull fracture. No significant scalp soft tissue swelling. OTHER: None. CT MAXILLOFACIAL FINDINGS OSSEOUS: No acute facial fracture. The mandible is intact, the condyles are located. No destructive bony lesions. Moderate LEFT temporomandibular osteoarthrosis. Patient is edentulous. ORBITS: Old LEFT orbital floor fracture with mild external herniation of extraconal fat. Ocular globes intact, lenses are located. Preservation orbital fat. SINUSES: Perforated nasal septum. Mild RIGHT sphenoid sinus mucosal thickening without air-fluid levels. Intact nasal septum is midline. Mastoid aircells are well aerated. SOFT TISSUES: No significant soft tissue swelling. No subcutaneous gas  or radiopaque foreign bodies. CT CERVICAL SPINE FINDINGS ALIGNMENT: Straightened cervical lordosis. Minimal grade 1 C7-T1 anterolisthesis. SKULL BASE AND VERTEBRAE: Cervical vertebral bodies and posterior elements are intact. Mild old T2 compression fracture. Severe C3-4 through C6-7 disc height loss with endplate sclerosis and marginal spurring consistent with degenerative disc. Moderate RIGHT C2-3 facet arthropathy. SOFT TISSUES AND SPINAL CANAL: Nonacute. Mild calcific atherosclerosis carotid siphon. DISC LEVELS: Moderate canal stenosis C5-6 and C6-7. Neural foraminal narrowing C3-4 through C7-T1 varying from moderate to severe. UPPER CHEST:  Lung apices are clear. OTHER: None. IMPRESSION: CT HEAD: 1. No acute intracranial process. 2. Increased moderate chronic small vessel ischemic changes. CT MAXILLOFACIAL: 1. No acute facial fracture. CT CERVICAL SPINE: 1. No fracture.  Minimal grade 1 C7-T1 anterolisthesis. 2. Similar advanced spondylosis resulting in moderate canal stenosis C5-6 and C6-7, multi level neural foraminal narrowing. Electronically Signed   By: Elon Alas M.D.   On: 10/03/2018 00:35    Procedures Procedures   Medications Ordered in ED Medications  sodium chloride 0.9 % bolus 1,000 mL (1,000 mLs Intravenous New Bag/Given 10/03/18 0331)  heparin injection 5,000 Units (has no administration in time range)  sodium chloride flush (NS) 0.9 % injection 3 mL (has no administration in time range)  0.9 %  sodium chloride infusion (has no administration in time range)  ondansetron (ZOFRAN) tablet 4 mg (has no administration in time range)    Or  ondansetron (ZOFRAN) injection 4 mg (has no administration in time range)  acetaminophen (TYLENOL) tablet 650 mg (has no administration in time range)    Or  acetaminophen (TYLENOL) suppository 650 mg (has no administration in time range)  albuterol (PROVENTIL) (2.5 MG/3ML) 0.083% nebulizer solution 2.5 mg (has no administration in time range)  traMADol (ULTRAM) tablet 50 mg (has no administration in time range)  insulin aspart (novoLOG) injection 5 Units (5 Units Subcutaneous Given 10/03/18 0007)  acetaminophen (TYLENOL) tablet 650 mg (650 mg Oral Given 10/03/18 0203)  traMADol (ULTRAM) tablet 50 mg (50 mg Oral Given 10/03/18 0253)  sodium chloride 0.9 % bolus 1,000 mL (0 mLs Intravenous Stopped 10/03/18 0357)     Initial Impression / Assessment and Plan / ED Course  I have reviewed the triage vital signs and the nursing notes.  Pertinent labs & imaging results that were available during my care of the patient were reviewed by me and considered in my medical decision  making (see chart for details).  Fall at home with minor head injury.  He will be sent for CT scans.  Elevated blood glucose.  On review of old records, he is noted to have several ED visits for hypoglycemia, and several hospitalizations for ketoacidosis.  Will check metabolic panel to make sure he is not in early ketoacidosis, and he will be given a dose of insulin.  Glucose is come down to a reasonable level with low dose of insulin.  CT of head, maxillofacial, cervical spine shows no acute injury.  Labs show evidence of acute kidney injury with creatinine 2.48 compared with 1.87 on 08/17/2018.  Also, CO2 is borderline low but with normal anion gap.  There is concerned that this could represent early DKA.  Venous blood gas was obtained which was unremarkable.  Mild anemia is noted which is actually improved over baseline.  Case is discussed with Dr. Tamala Julian of [facility], who agrees to admit the patient for hydration and monitoring electrolytes.  Final Clinical Impressions(s) / ED Diagnoses   Final diagnoses:  Acute kidney  injury (nontraumatic) ([place])  Fall at home, initial encounter  Hyperglycemia  Normochromic normocytic anemia    ED Discharge Orders    None       Delora Fuel, MD 91/66/06 [phone]

## 2018-10-02 NOTE — ED Notes (Signed)
Bed: EF20 Expected date:  Expected time:  Means of arrival:  Comments: Hyperglycemia/fall poss LOC

## 2018-10-02 NOTE — ED Triage Notes (Signed)
Pt arrived from home due to a fall, per in house caregiver pt had a loc. Diabetic, per ems CBG was 556.

## 2018-10-03 ENCOUNTER — Observation Stay (HOSPITAL_COMMUNITY): Payer: Medicare Other

## 2018-10-03 ENCOUNTER — Other Ambulatory Visit: Payer: Self-pay

## 2018-10-03 DIAGNOSIS — Z794 Long term (current) use of insulin: Secondary | ICD-10-CM

## 2018-10-03 DIAGNOSIS — E875 Hyperkalemia: Secondary | ICD-10-CM

## 2018-10-03 DIAGNOSIS — Z8673 Personal history of transient ischemic attack (TIA), and cerebral infarction without residual deficits: Secondary | ICD-10-CM

## 2018-10-03 DIAGNOSIS — E871 Hypo-osmolality and hyponatremia: Secondary | ICD-10-CM | POA: Diagnosis not present

## 2018-10-03 DIAGNOSIS — W19XXXA Unspecified fall, initial encounter: Secondary | ICD-10-CM | POA: Diagnosis not present

## 2018-10-03 DIAGNOSIS — S0990XA Unspecified injury of head, initial encounter: Secondary | ICD-10-CM | POA: Diagnosis not present

## 2018-10-03 DIAGNOSIS — R0781 Pleurodynia: Secondary | ICD-10-CM | POA: Diagnosis not present

## 2018-10-03 DIAGNOSIS — S0993XA Unspecified injury of face, initial encounter: Secondary | ICD-10-CM | POA: Diagnosis not present

## 2018-10-03 DIAGNOSIS — S20219A Contusion of unspecified front wall of thorax, initial encounter: Secondary | ICD-10-CM

## 2018-10-03 DIAGNOSIS — S299XXA Unspecified injury of thorax, initial encounter: Secondary | ICD-10-CM | POA: Diagnosis not present

## 2018-10-03 DIAGNOSIS — E1165 Type 2 diabetes mellitus with hyperglycemia: Secondary | ICD-10-CM | POA: Diagnosis not present

## 2018-10-03 DIAGNOSIS — S199XXA Unspecified injury of neck, initial encounter: Secondary | ICD-10-CM | POA: Diagnosis not present

## 2018-10-03 DIAGNOSIS — N179 Acute kidney failure, unspecified: Secondary | ICD-10-CM | POA: Diagnosis not present

## 2018-10-03 DIAGNOSIS — I10 Essential (primary) hypertension: Secondary | ICD-10-CM

## 2018-10-03 DIAGNOSIS — M542 Cervicalgia: Secondary | ICD-10-CM | POA: Diagnosis not present

## 2018-10-03 DIAGNOSIS — R51 Headache: Secondary | ICD-10-CM | POA: Diagnosis not present

## 2018-10-03 LAB — HEMOGLOBIN A1C
Hgb A1c MFr Bld: 11.1 % — ABNORMAL HIGH (ref 4.8–5.6)
Mean Plasma Glucose: 271.87 mg/dL

## 2018-10-03 LAB — BASIC METABOLIC PANEL
Anion gap: 15 (ref 5–15)
Anion gap: 9 (ref 5–15)
BUN: 60 mg/dL — ABNORMAL HIGH (ref 8–23)
BUN: 57 mg/dL — ABNORMAL HIGH (ref 8–23)
CO2: 19 mmol/L — ABNORMAL LOW (ref 22–32)
CO2: 19 mmol/L — ABNORMAL LOW (ref 22–32)
Calcium: 9.5 mg/dL (ref 8.9–10.3)
Calcium: 8.3 mg/dL — ABNORMAL LOW (ref 8.9–10.3)
Chloride: 95 mmol/L — ABNORMAL LOW (ref 98–111)
Chloride: 105 mmol/L (ref 98–111)
Creatinine, Ser: 2.48 mg/dL — ABNORMAL HIGH (ref 0.61–1.24)
Creatinine, Ser: 2.33 mg/dL — ABNORMAL HIGH (ref 0.61–1.24)
GFR calc Af Amer: 29 mL/min — ABNORMAL LOW (ref >60)
GFR calc Af Amer: 31 mL/min — ABNORMAL LOW (ref >60)
GFR calc non Af Amer: 25 mL/min — ABNORMAL LOW (ref >60)
GFR calc non Af Amer: 27 mL/min — ABNORMAL LOW (ref >60)
Glucose, Bld: 519 mg/dL (ref 70–99)
Glucose, Bld: 269 mg/dL — ABNORMAL HIGH (ref 70–99)
Potassium: 5.8 mmol/L — ABNORMAL HIGH (ref 3.5–5.1)
Potassium: 4.3 mmol/L (ref 3.5–5.1)
Sodium: 129 mmol/L — ABNORMAL LOW (ref 135–145)
Sodium: 133 mmol/L — ABNORMAL LOW (ref 135–145)

## 2018-10-03 LAB — DIFFERENTIAL
Abs Immature Granulocytes: 0.06 10*3/uL (ref 0.00–0.07)
Basophils Absolute: 0.1 10*3/uL (ref 0.0–0.1)
Basophils Relative: 1 %
Eosinophils Absolute: 0.2 10*3/uL (ref 0.0–0.5)
Eosinophils Relative: 2 %
Immature Granulocytes: 1 %
Lymphocytes Relative: 18 %
Lymphs Abs: 1.7 10*3/uL (ref 0.7–4.0)
Monocytes Absolute: 0.7 10*3/uL (ref 0.1–1.0)
Monocytes Relative: 8 %
Neutro Abs: 7 10*3/uL (ref 1.7–7.7)
Neutrophils Relative %: 70 %

## 2018-10-03 LAB — URINALYSIS, ROUTINE W REFLEX MICROSCOPIC
Bacteria, UA: NONE SEEN
Bilirubin Urine: NEGATIVE
Glucose, UA: >=500 mg/dL — AB
Hgb urine dipstick: NEGATIVE
Ketones, ur: 5 mg/dL — AB
Leukocytes, UA: NEGATIVE
Nitrite: NEGATIVE
Protein, ur: 30 mg/dL — AB
Specific Gravity, Urine: 1.013 (ref 1.005–1.030)
pH: 5 (ref 5.0–8.0)

## 2018-10-03 LAB — BLOOD GAS, VENOUS
Acid-base deficit: 4.8 mmol/L — ABNORMAL HIGH (ref 0.0–2.0)
Bicarbonate: 20.7 mmol/L (ref 20.0–28.0)
FIO2: 0.21
O2 Saturation: 57.5 %
Patient temperature: 98.6
pCO2, Ven: 42.4 mmHg — ABNORMAL LOW (ref 44.0–60.0)
pH, Ven: 7.309 (ref 7.250–7.430)

## 2018-10-03 LAB — CBC
HCT: 37.5 % — ABNORMAL LOW (ref 39.0–52.0)
Hemoglobin: 12.1 g/dL — ABNORMAL LOW (ref 13.0–17.0)
MCH: 29.4 pg (ref 26.0–34.0)
MCHC: 32.3 g/dL (ref 30.0–36.0)
MCV: 91.2 fL (ref 80.0–100.0)
Platelets: 232 10*3/uL (ref 150–400)
RBC: 4.11 MIL/uL — ABNORMAL LOW (ref 4.22–5.81)
RDW: 13.5 % (ref 11.5–15.5)
WBC: 9.7 10*3/uL (ref 4.0–10.5)
nRBC: 0 % (ref 0.0–0.2)

## 2018-10-03 LAB — GLUCOSE, CAPILLARY
Glucose-Capillary: 191 mg/dL — ABNORMAL HIGH (ref 70–99)
Glucose-Capillary: 212 mg/dL — ABNORMAL HIGH (ref 70–99)
Glucose-Capillary: 230 mg/dL — ABNORMAL HIGH (ref 70–99)
Glucose-Capillary: 270 mg/dL — ABNORMAL HIGH (ref 70–99)

## 2018-10-03 LAB — TROPONIN I: Troponin I: <0.03 ng/mL (ref <0.03)

## 2018-10-03 LAB — CBG MONITORING, ED: Glucose-Capillary: 279 mg/dL — ABNORMAL HIGH (ref 70–99)

## 2018-10-03 MED ORDER — SODIUM CHLORIDE 0.9 % IV BOLUS
1000.0000 mL | Freq: Once | INTRAVENOUS | Status: AC
  Administered 2018-10-03: 1000 mL via INTRAVENOUS

## 2018-10-03 MED ORDER — INFLUENZA VAC SPLIT HIGH-DOSE 0.5 ML IM SUSY
0.5000 mL | PREFILLED_SYRINGE | INTRAMUSCULAR | Status: AC
  Administered 2018-10-04: 0.5 mL via INTRAMUSCULAR
  Filled 2018-10-03: qty 0.5

## 2018-10-03 MED ORDER — ACETAMINOPHEN 325 MG PO TABS
650.0000 mg | ORAL_TABLET | Freq: Once | ORAL | Status: AC
  Administered 2018-10-03: 650 mg via ORAL
  Filled 2018-10-03: qty 2

## 2018-10-03 MED ORDER — SODIUM CHLORIDE 0.9% FLUSH
3.0000 mL | Freq: Two times a day (BID) | INTRAVENOUS | Status: DC
  Administered 2018-10-03: 3 mL via INTRAVENOUS

## 2018-10-03 MED ORDER — OXYCODONE HCL 5 MG PO TABS
5.0000 mg | ORAL_TABLET | Freq: Once | ORAL | Status: AC
  Administered 2018-10-03: 5 mg via ORAL
  Filled 2018-10-03: qty 1

## 2018-10-03 MED ORDER — TRAMADOL HCL 50 MG PO TABS: 50.0000 mg | ORAL_TABLET | Freq: Four times a day (QID) | ORAL | Status: DC | PRN

## 2018-10-03 MED ORDER — ACETAMINOPHEN 325 MG PO TABS
650.0000 mg | ORAL_TABLET | Freq: Four times a day (QID) | ORAL | Status: DC | PRN
  Administered 2018-10-03 – 2018-10-04 (×3): 650 mg via ORAL
  Filled 2018-10-03 (×3): qty 2

## 2018-10-03 MED ORDER — ALBUTEROL SULFATE (2.5 MG/3ML) 0.083% IN NEBU: 2.5000 mg | INHALATION_SOLUTION | Freq: Four times a day (QID) | RESPIRATORY_TRACT | Status: DC | PRN

## 2018-10-03 MED ORDER — HEPARIN SODIUM (PORCINE) 5000 UNIT/ML IJ SOLN
5000.0000 [IU] | Freq: Three times a day (TID) | INTRAMUSCULAR | Status: DC
  Administered 2018-10-03 – 2018-10-04 (×5): 5000 [IU] via SUBCUTANEOUS
  Filled 2018-10-03 (×5): qty 1

## 2018-10-03 MED ORDER — TRAMADOL HCL 50 MG PO TABS
50.0000 mg | ORAL_TABLET | Freq: Two times a day (BID) | ORAL | Status: DC | PRN
  Administered 2018-10-03 – 2018-10-04 (×2): 50 mg via ORAL
  Filled 2018-10-03 (×2): qty 1

## 2018-10-03 MED ORDER — ASPIRIN EC 81 MG PO TBEC
81.0000 mg | DELAYED_RELEASE_TABLET | Freq: Every day | ORAL | Status: DC
  Administered 2018-10-03 – 2018-10-04 (×2): 81 mg via ORAL
  Filled 2018-10-03 (×2): qty 1

## 2018-10-03 MED ORDER — ACETAMINOPHEN 650 MG RE SUPP: 650.0000 mg | Freq: Four times a day (QID) | RECTAL | Status: DC | PRN

## 2018-10-03 MED ORDER — AMLODIPINE BESYLATE 10 MG PO TABS
10.0000 mg | ORAL_TABLET | Freq: Every day | ORAL | Status: DC
  Administered 2018-10-03 – 2018-10-04 (×2): 10 mg via ORAL
  Filled 2018-10-03 (×2): qty 1

## 2018-10-03 MED ORDER — INSULIN ASPART PROT & ASPART (70-30 MIX) 100 UNIT/ML ~~LOC~~ SUSP
5.0000 [IU] | Freq: Two times a day (BID) | SUBCUTANEOUS | Status: DC
  Administered 2018-10-03: 5 [IU] via SUBCUTANEOUS
  Filled 2018-10-03: qty 10

## 2018-10-03 MED ORDER — ONDANSETRON HCL 4 MG PO TABS: 4.0000 mg | ORAL_TABLET | Freq: Four times a day (QID) | ORAL | Status: DC | PRN

## 2018-10-03 MED ORDER — SODIUM CHLORIDE 0.9 % IV SOLN
INTRAVENOUS | Status: DC
  Administered 2018-10-03 – 2018-10-04 (×4): via INTRAVENOUS

## 2018-10-03 MED ORDER — ONDANSETRON HCL 4 MG/2ML IJ SOLN: 4.0000 mg | Freq: Four times a day (QID) | INTRAMUSCULAR | Status: DC | PRN

## 2018-10-03 MED ORDER — TRAMADOL HCL 50 MG PO TABS
50.0000 mg | ORAL_TABLET | Freq: Once | ORAL | Status: AC
  Administered 2018-10-03: 50 mg via ORAL
  Filled 2018-10-03: qty 1

## 2018-10-03 MED ORDER — INSULIN ASPART PROT & ASPART (70-30 MIX) 100 UNIT/ML ~~LOC~~ SUSP
8.0000 [IU] | Freq: Two times a day (BID) | SUBCUTANEOUS | Status: DC
  Administered 2018-10-03 – 2018-10-04 (×2): 8 [IU] via SUBCUTANEOUS
  Filled 2018-10-03: qty 10

## 2018-10-03 NOTE — ED Notes (Signed)
Patient transported to X-ray 

## 2018-10-03 NOTE — ED Notes (Signed)
ED TO INPATIENT HANDOFF REPORT  Name/Age/Gender [name] [name] 72 y.o. male  Code Status    Code Status Orders  (From admission, onward)         Start     Ordered   10/03/18 0351  Full code  Continuous     10/03/18 0353        Code Status History    Date Active Date Inactive Code Status Order ID Comments User Context   11/17/2017 0006 11/19/2017 2317 Full Code [id number]  Etta Quill, DO ED   08/01/2017 1758 08/13/2017 2237 Full Code [id number]  Magdalen Spatz, NP ED   07/25/2017 1359 07/28/2017 1641 Full Code [id number]  Samella Parr, NP ED   06/27/2017 1619 06/30/2017 2006 Full Code [id number]  Samella Parr, NP Inpatient   05/03/2017 1232 05/08/2017 1855 Full Code [id number]  Benito Mccreedy, MD Inpatient   05/03/2017 1045 05/03/2017 1232 DNR [id number]  Benito Mccreedy, MD ED   03/22/2017 2351 03/31/2017 1546 DNR [id number]  Karmen Bongo, MD Inpatient   01/30/2017 0302 02/01/2017 1911 Full Code [id number]  Gardiner Barefoot, NP Inpatient   01/29/2017 2222 01/30/2017 0302 Full Code [id number]  Karmen Bongo, MD Inpatient   01/16/2017 2209 01/20/2017 1843 Full Code [id number]  Toy Baker, MD Inpatient   12/28/2016 1712 12/30/2016 1924 Full Code [id number]  Hosie Poisson, MD ED   12/14/2016 0038 12/17/2016 2110 Full Code [id number]  Vianne Bulls, MD ED   11/16/2016 1117 11/20/2016 2230 Full Code [id number]  Radene Gunning, NP ED   10/01/2016 1828 10/07/2016 1953 Full Code [id number]  Omar Person, NP ED   08/17/2016 1438 08/19/2016 2216 Full Code [id number]  Maren Reamer, MD ED   07/06/2016 0130 07/07/2016 2243 Full Code [id number]  Lily Kocher, MD Inpatient   06/15/2016 1007 06/21/2016 1921 Full Code [id number]  Tawni Millers, MD Inpatient   05/15/2016 2135 05/17/2016 1653 Full Code [id number]  Vianne Bulls, MD ED   05/09/2016 0252 05/12/2016 1716 Full Code [id number]  Roney Jaffe, MD Inpatient   11/01/2015 1837 11/03/2015 1708 Full Code [id number]  Robbie Lis, MD  Inpatient   09/26/2015 0012 09/27/2015 1542 Full Code [id number]  Etta Quill, DO ED   08/21/2015 1725 08/24/2015 2120 Full Code [id number]  Domenic Polite, MD Inpatient   08/12/2015 1637 08/14/2015 1445 Full Code [id number]  Robbie Lis, MD ED   08/08/2015 0037 08/10/2015 1830 Full Code [id number]  Allyne Gee, MD ED   07/05/2015 0921 07/09/2015 1635 Full Code [id number]  Chesley Mires, MD ED   07/02/2015 1836 07/04/2015 2236 Full Code [id number]  Marshell Garfinkel, MD ED   03/20/2015 1106 03/23/2015 1550 Full Code [id number]  Allie Bossier, MD ED   03/20/2015 0032 03/20/2015 1106 Full Code [id number]  Lavina Hamman, MD ED   03/19/2014 2157 03/22/2014 1232 Full Code [id number]  Rise Patience, MD Inpatient   01/21/2014 1506 01/25/2014 1212 Full Code [id number]  Eugenie Filler, MD ED   07/03/2013 2050 07/05/2013 1609 Full Code [id number]  Theodis Blaze, MD ED   05/29/2013 2249 05/31/2013 1602 Full Code [id number]  Merton Border, MD Inpatient   02/05/2013 1919 02/09/2013 1740 Full Code [id number]  [name], MD Inpatient   02/02/2013 2057 02/05/2013 1919 Full Code [id number]  Erline Hau, MD ED      Home/SNF/Other Home  Chief Complaint fall;hyperglycemia  Level of Care/Admitting Diagnosis ED Disposition    ED Disposition Condition  [facility]: [facility] [[id number]]  Level of Care: Telemetry [5]  Admit to tele based on following criteria: Complex arrhythmia (Bradycardia/Tachycardia)  Diagnosis: Fall [290176]  Admitting Physician: Norval Morton [[id number]]  Attending Physician: Norval Morton [[id number]]  PT Class (Do Not Modify): Observation [104]  PT Acc Code (Do Not Modify): Observation [10022]       Medical History Past Medical History:  Diagnosis Date  . 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 Closed comminuted intertrochanteric fracture of left femur ([place])   . Diabetes mellitus  without complication ([place])   . Diabetic hyperosmolar non-ketotic state ([place]) 05/15/2016  . DKA (diabetic ketoacidoses) ([place]) 05/09/2016  . Hypertension   . Postoperative anemia due to acute blood loss 06/18/2016  . Retroperitoneal hematoma 06/18/2016  . Stroke ([place])   . Vitamin B 12 deficiency 06/18/2016    Allergies No Known Allergies  IV Location/Drains/Wounds Patient Lines/Drains/Airways Status   Active Line/Drains/Airways    Name:   Placement date:   Placement time:   Site:   Days:   Peripheral IV 10/03/18 Right;Medial Arm   10/03/18    0140    Arm   less than 1          Labs/Imaging Results for orders placed or performed during the hospital encounter of 10/02/18 (from the past 48 hour(s))  CBG monitoring, ED     Status: Abnormal   Collection Time: 10/02/18 11:21 PM  Result Value Ref Range   Glucose-Capillary 557 (HH) 70 - 99 mg/dL   Comment 1 Notify RN   Basic metabolic panel     Status: Abnormal   Collection Time: 10/03/18 12:09 AM  Result Value Ref Range   Sodium 129 (L) 135 - 145 mmol/L   Potassium 5.8 (H) 3.5 - 5.1 mmol/L   Chloride 95 (L) 98 - 111 mmol/L   CO2 19 (L) 22 - 32 mmol/L   Glucose, Bld 519 (HH) 70 - 99 mg/dL    Comment: CRITICAL RESULT CALLED TO, READ BACK BY AND VERIFIED WITH: Edge Mauger, H RN @0137  ON 10/03/18 [name],K    BUN 60 (H) 8 - 23 mg/dL   Creatinine, Ser 2.48 (H) 0.61 - 1.24 mg/dL   Calcium 9.5 8.9 - 10.3 mg/dL   GFR calc non Af Amer 25 (L) >60 mL/min   GFR calc Af Amer 29 (L) >60 mL/min   Anion gap 15 5 - 15    Comment: Performed at [facility], [place] [address] [place], [place] [address]  CBC     Status: Abnormal   Collection Time: 10/03/18  1:41 AM  Result Value Ref Range   WBC 9.7 4.0 - 10.5 K/uL   RBC 4.11 (L) 4.22 - 5.81 MIL/uL   Hemoglobin 12.1 (L) 13.0 - 17.0 g/dL   HCT 37.5 (L) 39.0 - 52.0 %   MCV 91.2 80.0 - 100.0 fL   MCH 29.4 26.0 - 34.0 pg   MCHC 32.3 30.0 - 36.0 g/dL   RDW 13.5 11.5 - 15.5 %   Platelets  232 150 - 400 K/uL    Comment: REPEATED TO VERIFY DELTA CHECK NOTED SPECIMEN CHECKED FOR CLOTS    nRBC 0.0 0.0 - 0.2 %    Comment: Performed at [facility], [place] [address] [place], [place] [address]  Differential     Status: None   Collection Time: 10/03/18  1:41 AM  Result Value  Ref Range   Neutrophils Relative % 70 %   Neutro Abs 7.0 1.7 - 7.7 K/uL   Lymphocytes Relative 18 %   Lymphs Abs 1.7 0.7 - 4.0 K/uL   Monocytes Relative 8 %   Monocytes Absolute 0.7 0.1 - 1.0 K/uL   Eosinophils Relative 2 %   Eosinophils Absolute 0.2 0.0 - 0.5 K/uL   Basophils Relative 1 %   Basophils Absolute 0.1 0.0 - 0.1 K/uL   Immature Granulocytes 1 %   Abs Immature Granulocytes 0.06 0.00 - 0.07 K/uL    Comment: Performed at [facility], [place] [address], [place], [place] [address]  POC CBG, ED     Status: Abnormal   Collection Time: 10/03/18  3:28 AM  Result Value Ref Range   Glucose-Capillary 279 (H) 70 - 99 mg/dL  Blood gas, venous     Status: Abnormal   Collection Time: 10/03/18  3:28 AM  Result Value Ref Range   FIO2 0.21    Delivery systems ROOM AIR    pH, Ven 7.309 7.250 - 7.430   pCO2, Ven 42.4 (L) 44.0 - 60.0 mmHg   pO2, Ven BELOW REPORTABLE RANGE 32.0 - 45.0 mmHg    Comment: CRITICAL RESULT CALLED TO, READ BACK BY AND VERIFIED WITH: Fuller Plan, MD AT [phone] BY JESSICA NEUGENT,RRT,RCP ON 10/03/18    Bicarbonate 20.7 20.0 - 28.0 mmol/L   Acid-base deficit 4.8 (H) 0.0 - 2.0 mmol/L   O2 Saturation 57.5 %   Patient temperature 98.6    Collection site VEIN    Drawn by [place]    Sample type VENOUS     Comment: Performed at [facility] [address] [place], [place] [address]   Ct Head Wo Contrast  Result Date: 10/03/2018 CLINICAL DATA:  Fall, headache, facial and neck pain. History of stroke, hypertension, diabetes. EXAM: CT HEAD WITHOUT CONTRAST CT MAXILLOFACIAL WITHOUT CONTRAST CT CERVICAL SPINE WITHOUT  CONTRAST TECHNIQUE: Multidetector CT imaging of the head, cervical spine, and maxillofacial structures were performed using the standard protocol without intravenous contrast. Multiplanar CT image reconstructions of the cervical spine and maxillofacial structures were also generated. COMPARISON:  CT HEAD August 09, 2017 and CT cervical spine October 01, 2016 FINDINGS: CT HEAD FINDINGS BRAIN: No intraparenchymal hemorrhage, mass effect nor midline shift. The ventricles and sulci are normal for age. Confluent supratentorial white matter hypodensities. No acute large vascular territory infarcts. No abnormal extra-axial fluid collections. Basal cisterns are patent. VASCULAR: Moderate to severe calcific atherosclerosis of the carotid siphons. SKULL: No skull fracture. No significant scalp soft tissue swelling. OTHER: None. CT MAXILLOFACIAL FINDINGS OSSEOUS: No acute facial fracture. The mandible is intact, the condyles are located. No destructive bony lesions. Moderate LEFT temporomandibular osteoarthrosis. Patient is edentulous. ORBITS: Old LEFT orbital floor fracture with mild external herniation of extraconal fat. Ocular globes intact, lenses are located. Preservation orbital fat. SINUSES: Perforated nasal septum. Mild RIGHT sphenoid sinus mucosal thickening without air-fluid levels. Intact nasal septum is midline. Mastoid aircells are well aerated. SOFT TISSUES: No significant soft tissue swelling. No subcutaneous gas or radiopaque foreign bodies. CT CERVICAL SPINE FINDINGS ALIGNMENT: Straightened cervical lordosis. Minimal grade 1 C7-T1 anterolisthesis. SKULL BASE AND VERTEBRAE: Cervical vertebral bodies and posterior elements are intact. Mild old T2 compression fracture. Severe C3-4 through C6-7 disc height loss with endplate sclerosis and marginal spurring consistent with degenerative disc. Moderate RIGHT C2-3 facet arthropathy. SOFT TISSUES AND SPINAL CANAL: Nonacute. Mild calcific atherosclerosis carotid  siphon.  DISC LEVELS: Moderate canal stenosis C5-6 and C6-7. Neural foraminal narrowing C3-4 through C7-T1 varying from moderate to severe. UPPER CHEST: Lung apices are clear. OTHER: None. IMPRESSION: CT HEAD: 1. No acute intracranial process. 2. Increased moderate chronic small vessel ischemic changes. CT MAXILLOFACIAL: 1. No acute facial fracture. CT CERVICAL SPINE: 1. No fracture.  Minimal grade 1 C7-T1 anterolisthesis. 2. Similar advanced spondylosis resulting in moderate canal stenosis C5-6 and C6-7, multi level neural foraminal narrowing. Electronically Signed   By: Elon Alas M.D.   On: 10/03/2018 00:35   Ct Cervical Spine Wo Contrast  Result Date: 10/03/2018 CLINICAL DATA:  Fall, headache, facial and neck pain. History of stroke, hypertension, diabetes. EXAM: CT HEAD WITHOUT CONTRAST CT MAXILLOFACIAL WITHOUT CONTRAST CT CERVICAL SPINE WITHOUT CONTRAST TECHNIQUE: Multidetector CT imaging of the head, cervical spine, and maxillofacial structures were performed using the standard protocol without intravenous contrast. Multiplanar CT image reconstructions of the cervical spine and maxillofacial structures were also generated. COMPARISON:  CT HEAD August 09, 2017 and CT cervical spine October 01, 2016 FINDINGS: CT HEAD FINDINGS BRAIN: No intraparenchymal hemorrhage, mass effect nor midline shift. The ventricles and sulci are normal for age. Confluent supratentorial white matter hypodensities. No acute large vascular territory infarcts. No abnormal extra-axial fluid collections. Basal cisterns are patent. VASCULAR: Moderate to severe calcific atherosclerosis of the carotid siphons. SKULL: No skull fracture. No significant scalp soft tissue swelling. OTHER: None. CT MAXILLOFACIAL FINDINGS OSSEOUS: No acute facial fracture. The mandible is intact, the condyles are located. No destructive bony lesions. Moderate LEFT temporomandibular osteoarthrosis. Patient is edentulous. ORBITS: Old LEFT orbital floor  fracture with mild external herniation of extraconal fat. Ocular globes intact, lenses are located. Preservation orbital fat. SINUSES: Perforated nasal septum. Mild RIGHT sphenoid sinus mucosal thickening without air-fluid levels. Intact nasal septum is midline. Mastoid aircells are well aerated. SOFT TISSUES: No significant soft tissue swelling. No subcutaneous gas or radiopaque foreign bodies. CT CERVICAL SPINE FINDINGS ALIGNMENT: Straightened cervical lordosis. Minimal grade 1 C7-T1 anterolisthesis. SKULL BASE AND VERTEBRAE: Cervical vertebral bodies and posterior elements are intact. Mild old T2 compression fracture. Severe C3-4 through C6-7 disc height loss with endplate sclerosis and marginal spurring consistent with degenerative disc. Moderate RIGHT C2-3 facet arthropathy. SOFT TISSUES AND SPINAL CANAL: Nonacute. Mild calcific atherosclerosis carotid siphon. DISC LEVELS: Moderate canal stenosis C5-6 and C6-7. Neural foraminal narrowing C3-4 through C7-T1 varying from moderate to severe. UPPER CHEST: Lung apices are clear. OTHER: None. IMPRESSION: CT HEAD: 1. No acute intracranial process. 2. Increased moderate chronic small vessel ischemic changes. CT MAXILLOFACIAL: 1. No acute facial fracture. CT CERVICAL SPINE: 1. No fracture.  Minimal grade 1 C7-T1 anterolisthesis. 2. Similar advanced spondylosis resulting in moderate canal stenosis C5-6 and C6-7, multi level neural foraminal narrowing. Electronically Signed   By: Elon Alas M.D.   On: 10/03/2018 00:35   Ct Maxillofacial Wo Contrast  Result Date: 10/03/2018 CLINICAL DATA:  Fall, headache, facial and neck pain. History of stroke, hypertension, diabetes. EXAM: CT HEAD WITHOUT CONTRAST CT MAXILLOFACIAL WITHOUT CONTRAST CT CERVICAL SPINE WITHOUT CONTRAST TECHNIQUE: Multidetector CT imaging of the head, cervical spine, and maxillofacial structures were performed using the standard protocol without intravenous contrast. Multiplanar CT image  reconstructions of the cervical spine and maxillofacial structures were also generated. COMPARISON:  CT HEAD August 09, 2017 and CT cervical spine October 01, 2016 FINDINGS: CT HEAD FINDINGS BRAIN: No intraparenchymal hemorrhage, mass effect nor midline shift. The ventricles and sulci are normal for age. Confluent supratentorial white matter  hypodensities. No acute large vascular territory infarcts. No abnormal extra-axial fluid collections. Basal cisterns are patent. VASCULAR: Moderate to severe calcific atherosclerosis of the carotid siphons. SKULL: No skull fracture. No significant scalp soft tissue swelling. OTHER: None. CT MAXILLOFACIAL FINDINGS OSSEOUS: No acute facial fracture. The mandible is intact, the condyles are located. No destructive bony lesions. Moderate LEFT temporomandibular osteoarthrosis. Patient is edentulous. ORBITS: Old LEFT orbital floor fracture with mild external herniation of extraconal fat. Ocular globes intact, lenses are located. Preservation orbital fat. SINUSES: Perforated nasal septum. Mild RIGHT sphenoid sinus mucosal thickening without air-fluid levels. Intact nasal septum is midline. Mastoid aircells are well aerated. SOFT TISSUES: No significant soft tissue swelling. No subcutaneous gas or radiopaque foreign bodies. CT CERVICAL SPINE FINDINGS ALIGNMENT: Straightened cervical lordosis. Minimal grade 1 C7-T1 anterolisthesis. SKULL BASE AND VERTEBRAE: Cervical vertebral bodies and posterior elements are intact. Mild old T2 compression fracture. Severe C3-4 through C6-7 disc height loss with endplate sclerosis and marginal spurring consistent with degenerative disc. Moderate RIGHT C2-3 facet arthropathy. SOFT TISSUES AND SPINAL CANAL: Nonacute. Mild calcific atherosclerosis carotid siphon. DISC LEVELS: Moderate canal stenosis C5-6 and C6-7. Neural foraminal narrowing C3-4 through C7-T1 varying from moderate to severe. UPPER CHEST: Lung apices are clear. OTHER: None. IMPRESSION: CT  HEAD: 1. No acute intracranial process. 2. Increased moderate chronic small vessel ischemic changes. CT MAXILLOFACIAL: 1. No acute facial fracture. CT CERVICAL SPINE: 1. No fracture.  Minimal grade 1 C7-T1 anterolisthesis. 2. Similar advanced spondylosis resulting in moderate canal stenosis C5-6 and C6-7, multi level neural foraminal narrowing. Electronically Signed   By: Elon Alas M.D.   On: 10/03/2018 00:35   None  Pending Labs Unresulted Labs (From admission, onward)    Start     Ordered   10/03/18 1245  Basic metabolic panel  Tomorrow morning,   R     10/03/18 0353   10/03/18 0327  Urinalysis, Routine w reflex microscopic  ONCE - STAT,   R     10/03/18 0326   10/02/18 2338  CBC with Differential  Once,   STAT     10/02/18 2338          Vitals/Pain Today's Vitals   10/03/18 0140 10/03/18 0200 10/03/18 0333 10/03/18 0358  BP: (!) 89/65 128/72  122/62  Pulse: 86 85  63  Resp: 20 18  17   Temp:      TempSrc:      SpO2: 99% 99%  99%  Weight:      Height:      PainSc:  9  9      Isolation Precautions No active isolations  Medications Medications  sodium chloride 0.9 % bolus 1,000 mL (1,000 mLs Intravenous New Bag/Given 10/03/18 0331)  heparin injection 5,000 Units (has no administration in time range)  sodium chloride flush (NS) 0.9 % injection 3 mL (has no administration in time range)  0.9 %  sodium chloride infusion (has no administration in time range)  ondansetron (ZOFRAN) tablet 4 mg (has no administration in time range)    Or  ondansetron (ZOFRAN) injection 4 mg (has no administration in time range)  acetaminophen (TYLENOL) tablet 650 mg (has no administration in time range)    Or  acetaminophen (TYLENOL) suppository 650 mg (has no administration in time range)  albuterol (PROVENTIL) (2.5 MG/3ML) 0.083% nebulizer solution 2.5 mg (has no administration in time range)  traMADol (ULTRAM) tablet 50 mg (has no administration in time range)  insulin aspart  (novoLOG) injection 5 Units (  5 Units Subcutaneous Given 10/03/18 0007)  acetaminophen (TYLENOL) tablet 650 mg (650 mg Oral Given 10/03/18 0203)  traMADol (ULTRAM) tablet 50 mg (50 mg Oral Given 10/03/18 0253)  sodium chloride 0.9 % bolus 1,000 mL (0 mLs Intravenous Stopped 10/03/18 0357)    Mobility walks

## 2018-10-03 NOTE — H&P (Signed)
History and Physical    [name] [name] PIR:[id number] DOB: [birth year] DOA: 10/02/2018  Referring MD/NP/PA: Dr. Delora Fuel PCP: Azzie Glatter, [name]  Patient coming from: home via EMS  Chief Complaint:Fall  I have personally briefly reviewed patient's old medical records in [place]   HPI: [name] [name] is a 72 y.o. male with medical history significant of HTN, DM type II, CVAs with residual deficit, and CKD stage III; who presents after having a fall at home.  Patient provides some history and and talks with his sister over the phone more information is obtained.  Apparently the patient reports tripping and falling last night he hit his head and chest on a nearby wall and fell down to the ground.  His sister reports that he did lose consciousness for some unknown period in time as the reason for her calling EMS.  After regaining consciousness patient complains of chest discomfort.  The patient's daughter helped care for him, but died recently and was buried on December 13. Since that time the patient has been living with his sister who reports being not as familiar with his medication regimen.  However, over the last 2 days she notes that his glucose has been reading high and they had given him 10 or 15 units of insulin earlier in the day.  He recently lost insurance and has been buying 70/30 insulin given himself anywhere from 5 to 20 units depending on his blood sugar readings.  Reports that he has been drinking lots of water.  Sister reports that she has 59 year old mother and a Down syndrome brother to care for and that the patient likely needs placement in a senior care facility.  ED Course: Upon admission into the emergency department patient was seen to be afebrile, blood pressure 89/65-147/76, and all other vital signs maintained.  Labs revealed WBC 9.7, hemoglobin 12.1, sodium 129, potassium 5.8, chloride 95, CO2 19, BUN 60, creatinine 2.48, glucose 519, and anion gap 15.  He was  given 1 L normal saline IV fluids with improvement of blood pressures and 5 units of insulin.  TRH called to admit for acute kidney injury.  Review of Systems  Constitutional: Positive for malaise/fatigue. Negative for chills and fever.  HENT: Negative for congestion and sinus pain.   Eyes: Negative for photophobia and pain.  Respiratory: Negative for sputum production and shortness of breath.   Cardiovascular: Positive for chest pain. Negative for leg swelling.  Gastrointestinal: Negative for abdominal pain, nausea and vomiting.  Genitourinary: Positive for frequency. Negative for hematuria.  Musculoskeletal: Positive for falls and myalgias.  Skin: Negative for rash.  Neurological: Positive for loss of consciousness. Negative for focal weakness.  Endo/Heme/Allergies: Positive for polydipsia.  Psychiatric/Behavioral: Positive for depression. Negative for substance abuse.    Past Medical History:  Diagnosis Date  . 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 Closed comminuted intertrochanteric fracture of left femur ([place])   . Diabetes mellitus without complication ([place])   . Diabetic hyperosmolar non-ketotic state ([place]) 05/15/2016  . DKA (diabetic ketoacidoses) ([place]) 05/09/2016  . Hypertension   . Postoperative anemia due to acute blood loss 06/18/2016  . Retroperitoneal hematoma 06/18/2016  . Stroke ([place])   . Vitamin B 12 deficiency 06/18/2016    Past Surgical History:  Procedure Laterality Date  . HIP ARTHROPLASTY Right 02/05/2013   Procedure: ARTHROPLASTY BIPOLAR HIP;  Surgeon: [name], MD;  Location: [facility];  Service: Orthopedics;  Laterality: Right;  .  INTRAMEDULLARY (IM) NAIL INTERTROCHANTERIC Left 06/16/2016   Procedure: INTRAMEDULLARY (IM) NAIL INTERTROCHANTRIC;  Surgeon: Rod Can, MD;  Location: [facility];  Service: Orthopedics;  Laterality: Left;     reports that he has been smoking cigarettes. He has a 7.50 pack-year  smoking history. He has never used smokeless tobacco. He reports that he does not drink alcohol or use drugs.  No Known Allergies  Family History  Problem Relation Age of Onset  . Diabetes Mother   . Alzheimer's disease Mother   . Hypertension Mother   . Hyperlipidemia Father   . Hypertension Father   . Healthy Maternal Grandmother   . Pneumonia Maternal Grandfather     Prior to Admission medications   Medication Sig Start Date End Date Taking? Authorizing Provider  amLODipine (NORVASC) 10 MG tablet Take 1 tablet (10 mg total) by mouth daily. 05/25/18   Azzie Glatter, [name]  aspirin 81 MG EC tablet Take 1 tablet (81 mg total) by mouth daily. 05/25/18   Azzie Glatter, [name]  FLUoxetine (PROZAC) 40 MG capsule Take 1 capsule (40 mg total) by mouth daily. 05/25/18   Azzie Glatter, [name]  glucagon (GLUCAGON EMERGENCY) 1 MG injection Inject 1 mg into the vein once as needed for up to 1 dose. Patient not taking: Reported on 08/17/2018 05/25/18   Azzie Glatter, [name]  glucose blood (TRUE METRIX BLOOD GLUCOSE TEST) test strip CHECK BLOOD SUGAR UP TO 4 TIMES A DAY. 05/25/18   Azzie Glatter, [name]  insulin aspart (NOVOLOG) 100 UNIT/ML FlexPen Inject 3 Units into the skin 3 (three) times daily with meals. For blood sugars greater than 150 only Patient not taking: Reported on 08/17/2018 05/25/18   Azzie Glatter, [name]  Insulin Glargine (LANTUS SOLOSTAR) 100 UNIT/ML Solostar Pen Inject 5 Units into the skin daily at 10 pm. Patient not taking: Reported on 08/17/2018 05/25/18   Azzie Glatter, [name]  insulin NPH-regular Human (70-30) 100 UNIT/ML injection Inject 5-20 Units into the skin See admin instructions. Checks blood sugar 3-4 times daily and administers insulin based on readings    [provider]    Physical Exam:  Constitutional: Elderly male appears to be in no acute discomfort and able to follow commands NAD, calm, comfortable Vitals:   10/02/18 2309 10/02/18 2315 10/03/18  0140 10/03/18 0200  BP:  (!) 147/76 (!) 89/65 128/72  Pulse:  68 86 85  Resp:  18 20 18   Temp:  97.7 F (36.5 C)    TempSrc:  Oral    SpO2: 98% 99% 99% 99%  Weight:  66.2 kg    Height:  5\' 10"  (1.778 m)     Eyes: PERRL, lids and conjunctivae normal ENMT: Mucous membranes are moist. Posterior pharynx clear of any exudate or lesions.Normal dentition.  Neck: normal, supple, no masses, no thyromegaly Respiratory: clear to auscultation bilaterally, no wheezing, no crackles. Normal respiratory effort. No accessory muscle use.  Cardiovascular: Regular rate and rhythm, no murmurs / rubs / gallops. No extremity edema. 2+ pedal pulses. No carotid bruits.  Tenderness to palpation of chest wall.   Abdomen: no tenderness, no masses palpated. No hepatosplenomegaly. Bowel sounds positive.  Musculoskeletal: no clubbing / cyanosis. No joint deformity upper and lower extremities. Good ROM, no contractures. Normal muscle tone.  Skin: Poor skin turgor.  No rashes, lesions, ulcers. No induration Neurologic: CN 2-12 grossly intact. Sensation intact, DTR normal. Strength 5/5 in all 4.  Speech slowed. Psychiatric: Normal judgment and insight. Alert  and oriented x 3.  Flat affect.     Labs on Admission: I have personally reviewed following labs and imaging studies  CBC: No results for input(s): WBC, NEUTROABS, HGB, HCT, MCV, PLT in the last 168 hours. Basic Metabolic Panel: Recent Labs  Lab 10/03/18 0009  NA 129*  K 5.8*  CL 95*  CO2 19*  GLUCOSE 519*  BUN 60*  CREATININE 2.48*  CALCIUM 9.5   GFR: Estimated Creatinine Clearance: 24.8 mL/min (A) (by C-G formula based on SCr of 2.48 mg/dL (H)). Liver Function Tests: No results for input(s): AST, ALT, ALKPHOS, BILITOT, PROT, ALBUMIN in the last 168 hours. No results for input(s): LIPASE, AMYLASE in the last 168 hours. No results for input(s): AMMONIA in the last 168 hours. Coagulation Profile: No results for input(s): INR, PROTIME in the last 168  hours. Cardiac Enzymes: No results for input(s): CKTOTAL, CKMB, CKMBINDEX, TROPONINI in the last 168 hours. BNP (last 3 results) No results for input(s): PROBNP in the last 8760 hours. HbA1C: No results for input(s): HGBA1C in the last 72 hours. CBG: Recent Labs  Lab 10/02/18 2321  GLUCAP 557*   Lipid Profile: No results for input(s): CHOL, HDL, LDLCALC, TRIG, CHOLHDL, LDLDIRECT in the last 72 hours. Thyroid Function Tests: No results for input(s): TSH, T4TOTAL, FREET4, T3FREE, THYROIDAB in the last 72 hours. Anemia Panel: No results for input(s): VITAMINB12, FOLATE, FERRITIN, TIBC, IRON, RETICCTPCT in the last 72 hours. Urine analysis:    Component Value Date/Time   COLORURINE STRAW (A) 08/17/2018 0933   APPEARANCEUR CLEAR 08/17/2018 0933   LABSPEC 1.010 08/17/2018 0933   PHURINE 5.0 08/17/2018 0933   GLUCOSEU >=500 (A) 08/17/2018 0933   HGBUR SMALL (A) 08/17/2018 0933   BILIRUBINUR NEGATIVE 08/17/2018 0933   BILIRUBINUR negative 05/25/2018 1318   KETONESUR NEGATIVE 08/17/2018 0933   PROTEINUR 30 (A) 08/17/2018 0933   UROBILINOGEN 0.2 05/25/2018 1318   UROBILINOGEN 0.2 05/12/2017 0939   NITRITE NEGATIVE 08/17/2018 0933   LEUKOCYTESUR NEGATIVE 08/17/2018 0933   Sepsis Labs: No results found for this or any previous visit (from the past 240 hour(s)).   Radiological Exams on Admission: Ct Head Wo Contrast  Result Date: 10/03/2018 CLINICAL DATA:  Fall, headache, facial and neck pain. History of stroke, hypertension, diabetes. EXAM: CT HEAD WITHOUT CONTRAST CT MAXILLOFACIAL WITHOUT CONTRAST CT CERVICAL SPINE WITHOUT CONTRAST TECHNIQUE: Multidetector CT imaging of the head, cervical spine, and maxillofacial structures were performed using the standard protocol without intravenous contrast. Multiplanar CT image reconstructions of the cervical spine and maxillofacial structures were also generated. COMPARISON:  CT HEAD August 09, 2017 and CT cervical spine October 01, 2016  FINDINGS: CT HEAD FINDINGS BRAIN: No intraparenchymal hemorrhage, mass effect nor midline shift. The ventricles and sulci are normal for age. Confluent supratentorial white matter hypodensities. No acute large vascular territory infarcts. No abnormal extra-axial fluid collections. Basal cisterns are patent. VASCULAR: Moderate to severe calcific atherosclerosis of the carotid siphons. SKULL: No skull fracture. No significant scalp soft tissue swelling. OTHER: None. CT MAXILLOFACIAL FINDINGS OSSEOUS: No acute facial fracture. The mandible is intact, the condyles are located. No destructive bony lesions. Moderate LEFT temporomandibular osteoarthrosis. Patient is edentulous. ORBITS: Old LEFT orbital floor fracture with mild external herniation of extraconal fat. Ocular globes intact, lenses are located. Preservation orbital fat. SINUSES: Perforated nasal septum. Mild RIGHT sphenoid sinus mucosal thickening without air-fluid levels. Intact nasal septum is midline. Mastoid aircells are well aerated. SOFT TISSUES: No significant soft tissue swelling. No subcutaneous gas or  radiopaque foreign bodies. CT CERVICAL SPINE FINDINGS ALIGNMENT: Straightened cervical lordosis. Minimal grade 1 C7-T1 anterolisthesis. SKULL BASE AND VERTEBRAE: Cervical vertebral bodies and posterior elements are intact. Mild old T2 compression fracture. Severe C3-4 through C6-7 disc height loss with endplate sclerosis and marginal spurring consistent with degenerative disc. Moderate RIGHT C2-3 facet arthropathy. SOFT TISSUES AND SPINAL CANAL: Nonacute. Mild calcific atherosclerosis carotid siphon. DISC LEVELS: Moderate canal stenosis C5-6 and C6-7. Neural foraminal narrowing C3-4 through C7-T1 varying from moderate to severe. UPPER CHEST: Lung apices are clear. OTHER: None. IMPRESSION: CT HEAD: 1. No acute intracranial process. 2. Increased moderate chronic small vessel ischemic changes. CT MAXILLOFACIAL: 1. No acute facial fracture. CT CERVICAL  SPINE: 1. No fracture.  Minimal grade 1 C7-T1 anterolisthesis. 2. Similar advanced spondylosis resulting in moderate canal stenosis C5-6 and C6-7, multi level neural foraminal narrowing. Electronically Signed   By: Elon Alas M.D.   On: 10/03/2018 00:35   Ct Cervical Spine Wo Contrast  Result Date: 10/03/2018 CLINICAL DATA:  Fall, headache, facial and neck pain. History of stroke, hypertension, diabetes. EXAM: CT HEAD WITHOUT CONTRAST CT MAXILLOFACIAL WITHOUT CONTRAST CT CERVICAL SPINE WITHOUT CONTRAST TECHNIQUE: Multidetector CT imaging of the head, cervical spine, and maxillofacial structures were performed using the standard protocol without intravenous contrast. Multiplanar CT image reconstructions of the cervical spine and maxillofacial structures were also generated. COMPARISON:  CT HEAD August 09, 2017 and CT cervical spine October 01, 2016 FINDINGS: CT HEAD FINDINGS BRAIN: No intraparenchymal hemorrhage, mass effect nor midline shift. The ventricles and sulci are normal for age. Confluent supratentorial white matter hypodensities. No acute large vascular territory infarcts. No abnormal extra-axial fluid collections. Basal cisterns are patent. VASCULAR: Moderate to severe calcific atherosclerosis of the carotid siphons. SKULL: No skull fracture. No significant scalp soft tissue swelling. OTHER: None. CT MAXILLOFACIAL FINDINGS OSSEOUS: No acute facial fracture. The mandible is intact, the condyles are located. No destructive bony lesions. Moderate LEFT temporomandibular osteoarthrosis. Patient is edentulous. ORBITS: Old LEFT orbital floor fracture with mild external herniation of extraconal fat. Ocular globes intact, lenses are located. Preservation orbital fat. SINUSES: Perforated nasal septum. Mild RIGHT sphenoid sinus mucosal thickening without air-fluid levels. Intact nasal septum is midline. Mastoid aircells are well aerated. SOFT TISSUES: No significant soft tissue swelling. No subcutaneous  gas or radiopaque foreign bodies. CT CERVICAL SPINE FINDINGS ALIGNMENT: Straightened cervical lordosis. Minimal grade 1 C7-T1 anterolisthesis. SKULL BASE AND VERTEBRAE: Cervical vertebral bodies and posterior elements are intact. Mild old T2 compression fracture. Severe C3-4 through C6-7 disc height loss with endplate sclerosis and marginal spurring consistent with degenerative disc. Moderate RIGHT C2-3 facet arthropathy. SOFT TISSUES AND SPINAL CANAL: Nonacute. Mild calcific atherosclerosis carotid siphon. DISC LEVELS: Moderate canal stenosis C5-6 and C6-7. Neural foraminal narrowing C3-4 through C7-T1 varying from moderate to severe. UPPER CHEST: Lung apices are clear. OTHER: None. IMPRESSION: CT HEAD: 1. No acute intracranial process. 2. Increased moderate chronic small vessel ischemic changes. CT MAXILLOFACIAL: 1. No acute facial fracture. CT CERVICAL SPINE: 1. No fracture.  Minimal grade 1 C7-T1 anterolisthesis. 2. Similar advanced spondylosis resulting in moderate canal stenosis C5-6 and C6-7, multi level neural foraminal narrowing. Electronically Signed   By: Elon Alas M.D.   On: 10/03/2018 00:35   Ct Maxillofacial Wo Contrast  Result Date: 10/03/2018 CLINICAL DATA:  Fall, headache, facial and neck pain. History of stroke, hypertension, diabetes. EXAM: CT HEAD WITHOUT CONTRAST CT MAXILLOFACIAL WITHOUT CONTRAST CT CERVICAL SPINE WITHOUT CONTRAST TECHNIQUE: Multidetector CT imaging of the head, cervical  spine, and maxillofacial structures were performed using the standard protocol without intravenous contrast. Multiplanar CT image reconstructions of the cervical spine and maxillofacial structures were also generated. COMPARISON:  CT HEAD August 09, 2017 and CT cervical spine October 01, 2016 FINDINGS: CT HEAD FINDINGS BRAIN: No intraparenchymal hemorrhage, mass effect nor midline shift. The ventricles and sulci are normal for age. Confluent supratentorial white matter hypodensities. No acute  large vascular territory infarcts. No abnormal extra-axial fluid collections. Basal cisterns are patent. VASCULAR: Moderate to severe calcific atherosclerosis of the carotid siphons. SKULL: No skull fracture. No significant scalp soft tissue swelling. OTHER: None. CT MAXILLOFACIAL FINDINGS OSSEOUS: No acute facial fracture. The mandible is intact, the condyles are located. No destructive bony lesions. Moderate LEFT temporomandibular osteoarthrosis. Patient is edentulous. ORBITS: Old LEFT orbital floor fracture with mild external herniation of extraconal fat. Ocular globes intact, lenses are located. Preservation orbital fat. SINUSES: Perforated nasal septum. Mild RIGHT sphenoid sinus mucosal thickening without air-fluid levels. Intact nasal septum is midline. Mastoid aircells are well aerated. SOFT TISSUES: No significant soft tissue swelling. No subcutaneous gas or radiopaque foreign bodies. CT CERVICAL SPINE FINDINGS ALIGNMENT: Straightened cervical lordosis. Minimal grade 1 C7-T1 anterolisthesis. SKULL BASE AND VERTEBRAE: Cervical vertebral bodies and posterior elements are intact. Mild old T2 compression fracture. Severe C3-4 through C6-7 disc height loss with endplate sclerosis and marginal spurring consistent with degenerative disc. Moderate RIGHT C2-3 facet arthropathy. SOFT TISSUES AND SPINAL CANAL: Nonacute. Mild calcific atherosclerosis carotid siphon. DISC LEVELS: Moderate canal stenosis C5-6 and C6-7. Neural foraminal narrowing C3-4 through C7-T1 varying from moderate to severe. UPPER CHEST: Lung apices are clear. OTHER: None. IMPRESSION: CT HEAD: 1. No acute intracranial process. 2. Increased moderate chronic small vessel ischemic changes. CT MAXILLOFACIAL: 1. No acute facial fracture. CT CERVICAL SPINE: 1. No fracture.  Minimal grade 1 C7-T1 anterolisthesis. 2. Similar advanced spondylosis resulting in moderate canal stenosis C5-6 and C6-7, multi level neural foraminal narrowing. Electronically Signed    By: Elon Alas M.D.   On: 10/03/2018 00:35    EKG: Independently reviewed.  71 bpm with left anterior fascicular block similar to previous EKG  Assessment/Plan Diabetes mellitus type 2 with hyperglycemia: Patient presents with elevated blood glucose of 519 with CO2 19 and anion gap of the upper limit of normal at 15.  Patient had been given 5 units of insulin and 1 L of normal saline IV fluids.  No initial urinalysis or venous blood gas was obtained.  As hemoglobin A1c noted to be 9.6 on 8/13.  Patient had recently lost insurance and therefore has been taking over-the-counter 70/30 insulin.  Patient has history of hypoglycemia just last month. - Admit to a telemetry bed - Follow-up venous blood gas - Check urinalysis and hemoglobin A1c - Bolus additional 1 L normal saline IV fluids then placed on rate of 100 mL/h - Recheck BMP  - Hypoglycemic protocols - CBGs every 4 hours - Repeat blood glucose down to 279 after 1 L of IV fluids and 5 units of insulin. - Start NovoLog 70/30 insulin 5 units twice daily with meals - Adjust dosage as needed  Acute kidney injury superimposed on chronic kidney disease: Patient baseline creatinine previously have been around 1.87 on 11/5, but he presents with a creatinine of 2.48 and BUN 60.  Elevated BUN to creatinine ratio suggest prerenal cause of symptoms especially in the setting of hyperglycemia/possible DKA. - Continue IV fluids as tolerated - Check urinalysis - Check renal ultrasound  Fall with chest  contusion: Patient reports tripping and falling hitting his head head and chest on a wall.  He was found to have no acute findings on CT imaging of the head and neck.  He complains of chest pain has he hit his chest on a wall prior to falling.  Physical exam reveals tenderness to chest wall. - Check troponin - Check rib series - Incentive spirometry  - Tramadol as needed chest pain - Physical therapy to eval and treat - Telemetry for possibility  of arrhythmia  Essential hypertension: Patient was initially noted to have low blood pressures, but improved significantly after 1 L of normal saline IV fluids. - Continue amlodipine as tolerated  Hyperkalemia: Acute. Initial potassium 5.8 without significant EKG changes. - IV fluids as seen above - Recheck serum  Hyponatremia: Sodium 129 on admission.  Sodium levels within normal limits when corrected for hyperglycemia. - Continue to monitor  History of stroke with residual deficit - Continue aspirin  Social work consult for likely need of placement.  He reportedly lost his insurance.  He was previously cared for by his daughter, but she recently passed away and has been living with his sister who is having difficulty caring for 3 people in her home in addition to herself.  DVT prophylaxis: Heparin Code Status: Full Family Communication: Discussed over the phone with patient's sister Disposition Plan: TBD  Consults called: None Admission status: observation  Norval Morton MD [facility] Pager [phone]   If 7PM-7AM, please contact night-coverage [email or web address] Password [facility]  10/03/2018, 3:19 AM

## 2018-10-03 NOTE — ED Notes (Signed)
(  [phone] Sister to be updated/ride home

## 2018-10-03 NOTE — ED Notes (Signed)
Unsuccessful attempts to draw blood.

## 2018-10-03 NOTE — ED Notes (Signed)
Ultrasound at bedside

## 2018-10-03 NOTE — Plan of Care (Signed)
72 year old male admitted this morning with hyperglycemia treated with insulin and IV fluids.  Found also to have AKI on CKD.  He lives at home with his 75 year old mother.  His sister helps to take care of him his mother and brother with Down syndrome.  She feels overwhelmed taking care of all 3 of them.  And is requesting placement.

## 2018-10-04 DIAGNOSIS — Z794 Long term (current) use of insulin: Secondary | ICD-10-CM | POA: Diagnosis not present

## 2018-10-04 DIAGNOSIS — E1165 Type 2 diabetes mellitus with hyperglycemia: Secondary | ICD-10-CM | POA: Diagnosis not present

## 2018-10-04 LAB — GLUCOSE, CAPILLARY
Glucose-Capillary: 151 mg/dL — ABNORMAL HIGH (ref 70–99)
Glucose-Capillary: 204 mg/dL — ABNORMAL HIGH (ref 70–99)
Glucose-Capillary: 214 mg/dL — ABNORMAL HIGH (ref 70–99)
Glucose-Capillary: 268 mg/dL — ABNORMAL HIGH (ref 70–99)
Glucose-Capillary: 386 mg/dL — ABNORMAL HIGH (ref 70–99)

## 2018-10-04 LAB — BASIC METABOLIC PANEL
Anion gap: 9 (ref 5–15)
BUN: 31 mg/dL — ABNORMAL HIGH (ref 8–23)
CO2: 19 mmol/L — ABNORMAL LOW (ref 22–32)
Calcium: 8.4 mg/dL — ABNORMAL LOW (ref 8.9–10.3)
Chloride: 107 mmol/L (ref 98–111)
Creatinine, Ser: 1.48 mg/dL — ABNORMAL HIGH (ref 0.61–1.24)
GFR calc Af Amer: 54 mL/min — ABNORMAL LOW (ref >60)
GFR calc non Af Amer: 46 mL/min — ABNORMAL LOW (ref >60)
Glucose, Bld: 164 mg/dL — ABNORMAL HIGH (ref 70–99)
Potassium: 4.1 mmol/L (ref 3.5–5.1)
Sodium: 135 mmol/L (ref 135–145)

## 2018-10-04 MED ORDER — INSULIN ASPART PROT & ASPART (70-30 MIX) 100 UNIT/ML ~~LOC~~ SUSP: 12.0000 [IU] | Freq: Two times a day (BID) | SUBCUTANEOUS | 1 refills | Status: DC

## 2018-10-04 MED ORDER — INSULIN ASPART PROT & ASPART (70-30 MIX) 100 UNIT/ML ~~LOC~~ SUSP: 10.0000 [IU] | Freq: Two times a day (BID) | SUBCUTANEOUS | 1 refills | Status: DC

## 2018-10-04 MED ORDER — HYDRALAZINE HCL 20 MG/ML IJ SOLN
10.0000 mg | Freq: Four times a day (QID) | INTRAMUSCULAR | Status: DC | PRN
  Administered 2018-10-04: 10 mg via INTRAVENOUS
  Filled 2018-10-04: qty 1

## 2018-10-04 MED ORDER — INSULIN ASPART 100 UNIT/ML ~~LOC~~ SOLN
10.0000 [IU] | Freq: Once | SUBCUTANEOUS | Status: AC
  Administered 2018-10-04: 10 [IU] via SUBCUTANEOUS

## 2018-10-04 NOTE — Discharge Summary (Signed)
Physician Discharge Summary  [name] [name] ZRA:[id number] DOB: 1946-04-28 DOA: 10/02/2018  PCP: Azzie Glatter, [name]  Admit date: 10/02/2018 Discharge date: 10/04/2018  Admitted From: Home Disposition: Home  Recommendations for Outpatient Follow-up:  1. Follow up with PCP in 1-2 weeks 2. Please obtain BMP/CBC in one week   Home Health: PT OT, RN aide and social worker Equipment/Devices: None Discharge Condition: Stable CODE STATUS: Full code Diet recommendation: Carb modified cardiac Brief/Interim Summary:72 y.o. male with medical history significant of HTN, DM type II, CVAs with residual deficit, and CKD stage III; who presents after having a fall at home.  Patient provides some history and and talks with his sister over the phone more information is obtained.  Apparently the patient reports tripping and falling last night he hit his head and chest on a nearby wall and fell down to the ground.  His sister reports that he did lose consciousness for some unknown period in time as the reason for her calling EMS.  After regaining consciousness patient complains of chest discomfort.  The patient's daughter helped care for him, but died recently and was buried on December 13. Since that time the patient has been living with his sister who reports being not as familiar with his medication regimen.  However, over the last 2 days she notes that his glucose has been reading high and they had given him 10 or 15 units of insulin earlier in the day.  He recently lost insurance and has been buying 70/30 insulin given himself anywhere from 5 to 20 units depending on his blood sugar readings.  Reports that he has been drinking lots of water.  Sister reports that she has 47 year old mother and a Down syndrome brother to care for and that the patient likely needs placement in a senior care facility.  ED Course: Upon admission into the emergency department patient was seen to be afebrile, blood pressure  89/65-147/76, and all other vital signs maintained.  Labs revealed WBC 9.7, hemoglobin 12.1, sodium 129, potassium 5.8, chloride 95, CO2 19, BUN 60, creatinine 2.48, glucose 519, and anion gap 15.  He was given 1 L normal saline IV fluids with improvement of blood pressures and 5 units of insulin.  [facility] called to admit for acute kidney injury.  Discharge Diagnoses:  Principal Problem:   Uncontrolled type 2 diabetes mellitus with hyperglycemia, with long-term current use of insulin ([name]) Active Problems:   Hyponatremia   Hyperkalemia   Hypertension   History of cerebrovascular accident (CVA) in adulthood   Fall   Chest wall contusion   Diabetes mellitus type 2 with hyperglycemia: Patient presents with elevated blood glucose of 519 with CO2 19 and anion gap of the upper limit of normal at 15.  Patient had been given 5 units of insulin and 1 L of normal saline IV fluids.  We will discharge the patient on 7030 NPH 12 units twice a day.  Continue short-acting insulin 3 units prior to meals meals 3 times a day.    Acute kidney injury superimposed on chronic kidney disease: Stable and back to baseline with IV hydration.   Essential hypertension: Patient was initially noted to have low blood pressures, but improved significantly after 1 L of normal saline IV fluids. - Continue amlodipine as tolerated  Hyperkalemia: Resolved  Hyponatremia: Resolved  History of stroke with residual deficit - Continue aspirin    Estimated body mass index is 20.95 kg/m as calculated from the following:   Height as of  this encounter: 5\' 10"  (1.778 m).   Weight as of this encounter: 66.2 kg.  Discharge Instructions  Discharge Instructions    Call MD for:  persistant nausea and vomiting   Complete by:  As directed    Call MD for:  redness, tenderness, or signs of infection (pain, swelling, redness, odor or green/yellow discharge around incision site)   Complete by:  As directed    Call MD for:   temperature >100.4   Complete by:  As directed    Diet - low sodium heart healthy   Complete by:  As directed    Increase activity slowly   Complete by:  As directed      Allergies as of 10/04/2018   No Known Allergies     Medication List    STOP taking these medications   FLUoxetine 40 MG capsule Commonly known as:  PROZAC   glucagon 1 MG injection Commonly known as:  GLUCAGON EMERGENCY   Insulin Glargine 100 UNIT/ML Solostar Pen Commonly known as:  LANTUS SOLOSTAR   insulin NPH-regular Human (70-30) 100 UNIT/ML injection     TAKE these medications   amLODipine 10 MG tablet Commonly known as:  NORVASC Take 1 tablet (10 mg total) by mouth daily.   aspirin 81 MG EC tablet Take 1 tablet (81 mg total) by mouth daily.   glucose blood test strip Commonly known as:  TRUE METRIX BLOOD GLUCOSE TEST CHECK BLOOD SUGAR UP TO 4 TIMES A DAY.   insulin aspart 100 UNIT/ML FlexPen Commonly known as:  NOVOLOG Inject 3 Units into the skin 3 (three) times daily with meals. For blood sugars greater than 150 only   insulin aspart protamine- aspart (70-30) 100 UNIT/ML injection Commonly known as:  NOVOLOG MIX 70/30 Inject 0.12 mLs (12 Units total) into the skin 2 (two) times daily with a meal.       No Known Allergies  Consultations:  None   Procedures/Studies: Dg Ribs Bilateral W/chest  Result Date: 10/03/2018 CLINICAL DATA:  [place], landing on chest.  Rib pain. EXAM: BILATERAL RIBS AND CHEST - 4+ VIEW COMPARISON:  Chest radiograph August 06, 2017 FINDINGS: No acute fracture or other bone lesions are seen involving the ribs. Old bilateral rib fractures. There is no evidence of pneumothorax or pleural effusion. No pleural effusion or focal consolidation. Minimal bibasilar atelectasis. No pneumothorax. Heart size is upper limits of normal in size. Mediastinal silhouette is not suspicious. IMPRESSION: 1. No acute rib fracture deformity. 2. Borderline cardiomegaly, minimal  bibasilar atelectasis. Electronically Signed   By: Elon Alas M.D.   On: 10/03/2018 05:32   Ct Head Wo Contrast  Result Date: 10/03/2018 CLINICAL DATA:  Fall, headache, facial and neck pain. History of stroke, hypertension, diabetes. EXAM: CT HEAD WITHOUT CONTRAST CT MAXILLOFACIAL WITHOUT CONTRAST CT CERVICAL SPINE WITHOUT CONTRAST TECHNIQUE: Multidetector CT imaging of the head, cervical spine, and maxillofacial structures were performed using the standard protocol without intravenous contrast. Multiplanar CT image reconstructions of the cervical spine and maxillofacial structures were also generated. COMPARISON:  CT HEAD August 09, 2017 and CT cervical spine October 01, 2016 FINDINGS: CT HEAD FINDINGS BRAIN: No intraparenchymal hemorrhage, mass effect nor midline shift. The ventricles and sulci are normal for age. Confluent supratentorial white matter hypodensities. No acute large vascular territory infarcts. No abnormal extra-axial fluid collections. Basal cisterns are patent. VASCULAR: Moderate to severe calcific atherosclerosis of the carotid siphons. SKULL: No skull fracture. No significant scalp soft tissue swelling. OTHER: None. CT MAXILLOFACIAL FINDINGS  OSSEOUS: No acute facial fracture. The mandible is intact, the condyles are located. No destructive bony lesions. Moderate LEFT temporomandibular osteoarthrosis. Patient is edentulous. ORBITS: Old LEFT orbital floor fracture with mild external herniation of extraconal fat. Ocular globes intact, lenses are located. Preservation orbital fat. SINUSES: Perforated nasal septum. Mild RIGHT sphenoid sinus mucosal thickening without air-fluid levels. Intact nasal septum is midline. Mastoid aircells are well aerated. SOFT TISSUES: No significant soft tissue swelling. No subcutaneous gas or radiopaque foreign bodies. CT CERVICAL SPINE FINDINGS ALIGNMENT: Straightened cervical lordosis. Minimal grade 1 C7-T1 anterolisthesis. SKULL BASE AND VERTEBRAE:  Cervical vertebral bodies and posterior elements are intact. Mild old T2 compression fracture. Severe C3-4 through C6-7 disc height loss with endplate sclerosis and marginal spurring consistent with degenerative disc. Moderate RIGHT C2-3 facet arthropathy. SOFT TISSUES AND SPINAL CANAL: Nonacute. Mild calcific atherosclerosis carotid siphon. DISC LEVELS: Moderate canal stenosis C5-6 and C6-7. Neural foraminal narrowing C3-4 through C7-T1 varying from moderate to severe. UPPER CHEST: Lung apices are clear. OTHER: None. IMPRESSION: CT HEAD: 1. No acute intracranial process. 2. Increased moderate chronic small vessel ischemic changes. CT MAXILLOFACIAL: 1. No acute facial fracture. CT CERVICAL SPINE: 1. No fracture.  Minimal grade 1 C7-T1 anterolisthesis. 2. Similar advanced spondylosis resulting in moderate canal stenosis C5-6 and C6-7, multi level neural foraminal narrowing. Electronically Signed   By: Elon Alas M.D.   On: 10/03/2018 00:35   Ct Cervical Spine Wo Contrast  Result Date: 10/03/2018 CLINICAL DATA:  Fall, headache, facial and neck pain. History of stroke, hypertension, diabetes. EXAM: CT HEAD WITHOUT CONTRAST CT MAXILLOFACIAL WITHOUT CONTRAST CT CERVICAL SPINE WITHOUT CONTRAST TECHNIQUE: Multidetector CT imaging of the head, cervical spine, and maxillofacial structures were performed using the standard protocol without intravenous contrast. Multiplanar CT image reconstructions of the cervical spine and maxillofacial structures were also generated. COMPARISON:  CT HEAD August 09, 2017 and CT cervical spine October 01, 2016 FINDINGS: CT HEAD FINDINGS BRAIN: No intraparenchymal hemorrhage, mass effect nor midline shift. The ventricles and sulci are normal for age. Confluent supratentorial white matter hypodensities. No acute large vascular territory infarcts. No abnormal extra-axial fluid collections. Basal cisterns are patent. VASCULAR: Moderate to severe calcific atherosclerosis of the  carotid siphons. SKULL: No skull fracture. No significant scalp soft tissue swelling. OTHER: None. CT MAXILLOFACIAL FINDINGS OSSEOUS: No acute facial fracture. The mandible is intact, the condyles are located. No destructive bony lesions. Moderate LEFT temporomandibular osteoarthrosis. Patient is edentulous. ORBITS: Old LEFT orbital floor fracture with mild external herniation of extraconal fat. Ocular globes intact, lenses are located. Preservation orbital fat. SINUSES: Perforated nasal septum. Mild RIGHT sphenoid sinus mucosal thickening without air-fluid levels. Intact nasal septum is midline. Mastoid aircells are well aerated. SOFT TISSUES: No significant soft tissue swelling. No subcutaneous gas or radiopaque foreign bodies. CT CERVICAL SPINE FINDINGS ALIGNMENT: Straightened cervical lordosis. Minimal grade 1 C7-T1 anterolisthesis. SKULL BASE AND VERTEBRAE: Cervical vertebral bodies and posterior elements are intact. Mild old T2 compression fracture. Severe C3-4 through C6-7 disc height loss with endplate sclerosis and marginal spurring consistent with degenerative disc. Moderate RIGHT C2-3 facet arthropathy. SOFT TISSUES AND SPINAL CANAL: Nonacute. Mild calcific atherosclerosis carotid siphon. DISC LEVELS: Moderate canal stenosis C5-6 and C6-7. Neural foraminal narrowing C3-4 through C7-T1 varying from moderate to severe. UPPER CHEST: Lung apices are clear. OTHER: None. IMPRESSION: CT HEAD: 1. No acute intracranial process. 2. Increased moderate chronic small vessel ischemic changes. CT MAXILLOFACIAL: 1. No acute facial fracture. CT CERVICAL SPINE: 1. No fracture.  Minimal grade  1 C7-T1 anterolisthesis. 2. Similar advanced spondylosis resulting in moderate canal stenosis C5-6 and C6-7, multi level neural foraminal narrowing. Electronically Signed   By: Elon Alas M.D.   On: 10/03/2018 00:35   US Renal  Result Date: 10/03/2018 CLINICAL DATA:  Acute onset of renal insufficiency. EXAM: RENAL / URINARY  TRACT ULTRASOUND COMPLETE COMPARISON:  Renal ultrasound performed 12/28/2016, and CT of the abdomen and pelvis performed 07/06/2016 FINDINGS: Right Kidney: Renal measurements: 9.7 x 3.3 x 3.9 cm = volume: 64.8 mL. Echogenicity within normal limits. No mass or hydronephrosis visualized. Left Kidney: Renal measurements: 9.6 x 4.3 x 6.1 cm = volume: 130.0 mL. Echogenicity within normal limits. No mass or hydronephrosis visualized. Bladder: Appears normal for degree of bladder distention. IMPRESSION: Unremarkable renal ultrasound.  No evidence of hydronephrosis. Electronically Signed   By: Garald Balding M.D.   On: 10/03/2018 05:47   Ct Maxillofacial Wo Contrast  Result Date: 10/03/2018 CLINICAL DATA:  Fall, headache, facial and neck pain. History of stroke, hypertension, diabetes. EXAM: CT HEAD WITHOUT CONTRAST CT MAXILLOFACIAL WITHOUT CONTRAST CT CERVICAL SPINE WITHOUT CONTRAST TECHNIQUE: Multidetector CT imaging of the head, cervical spine, and maxillofacial structures were performed using the standard protocol without intravenous contrast. Multiplanar CT image reconstructions of the cervical spine and maxillofacial structures were also generated. COMPARISON:  CT HEAD August 09, 2017 and CT cervical spine October 01, 2016 FINDINGS: CT HEAD FINDINGS BRAIN: No intraparenchymal hemorrhage, mass effect nor midline shift. The ventricles and sulci are normal for age. Confluent supratentorial white matter hypodensities. No acute large vascular territory infarcts. No abnormal extra-axial fluid collections. Basal cisterns are patent. VASCULAR: Moderate to severe calcific atherosclerosis of the carotid siphons. SKULL: No skull fracture. No significant scalp soft tissue swelling. OTHER: None. CT MAXILLOFACIAL FINDINGS OSSEOUS: No acute facial fracture. The mandible is intact, the condyles are located. No destructive bony lesions. Moderate LEFT temporomandibular osteoarthrosis. Patient is edentulous. ORBITS: Old LEFT  orbital floor fracture with mild external herniation of extraconal fat. Ocular globes intact, lenses are located. Preservation orbital fat. SINUSES: Perforated nasal septum. Mild RIGHT sphenoid sinus mucosal thickening without air-fluid levels. Intact nasal septum is midline. Mastoid aircells are well aerated. SOFT TISSUES: No significant soft tissue swelling. No subcutaneous gas or radiopaque foreign bodies. CT CERVICAL SPINE FINDINGS ALIGNMENT: Straightened cervical lordosis. Minimal grade 1 C7-T1 anterolisthesis. SKULL BASE AND VERTEBRAE: Cervical vertebral bodies and posterior elements are intact. Mild old T2 compression fracture. Severe C3-4 through C6-7 disc height loss with endplate sclerosis and marginal spurring consistent with degenerative disc. Moderate RIGHT C2-3 facet arthropathy. SOFT TISSUES AND SPINAL CANAL: Nonacute. Mild calcific atherosclerosis carotid siphon. DISC LEVELS: Moderate canal stenosis C5-6 and C6-7. Neural foraminal narrowing C3-4 through C7-T1 varying from moderate to severe. UPPER CHEST: Lung apices are clear. OTHER: None. IMPRESSION: CT HEAD: 1. No acute intracranial process. 2. Increased moderate chronic small vessel ischemic changes. CT MAXILLOFACIAL: 1. No acute facial fracture. CT CERVICAL SPINE: 1. No fracture.  Minimal grade 1 C7-T1 anterolisthesis. 2. Similar advanced spondylosis resulting in moderate canal stenosis C5-6 and C6-7, multi level neural foraminal narrowing. Electronically Signed   By: Elon Alas M.D.   On: 10/03/2018 00:35   (Echo, Carotid, EGD, Colonoscopy, ERCP)    Subjective: Resting in bed in no acute distress awake alert answers all the questions appropriately  Discharge Exam: Vitals:   10/03/18 2044 10/04/18 0614  BP: 137/75 (!) 164/87  Pulse: 61 (!) 57  Resp: 18 18  Temp: 97.9 F (36.6 C) 98  F (36.7 C)  SpO2: 99% 96%   Vitals:   10/03/18 0542 10/03/18 1437 10/03/18 2044 10/04/18 0614  BP: 134/74 134/85 137/75 (!) 164/87   Pulse: 61 60 61 (!) 57  Resp: 17 13 18 18   Temp: 97.7 F (36.5 C) 97.7 F (36.5 C) 97.9 F (36.6 C) 98 F (36.7 C)  TempSrc: Oral Oral Oral Oral  SpO2: 100% 100% 99% 96%  Weight:      Height:        General: Pt is alert, awake, not in acute distress Cardiovascular: RRR, S1/S2 +, no rubs, no gallops Respiratory: CTA bilaterally, no wheezing, no rhonchi Abdominal: Soft, NT, ND, bowel sounds + Extremities: no edema, no cyanosis    The results of significant diagnostics from this hospitalization (including imaging, microbiology, ancillary and laboratory) are listed below for reference.     Microbiology: No results found for this or any previous visit (from the past 240 hour(s)).   Labs: BNP (last 3 results) No results for input(s): BNP in the last 8760 hours. Basic Metabolic Panel: Recent Labs  Lab 10/03/18 0009 10/03/18 0440 10/04/18 0452  NA 129* 133* 135  K 5.8* 4.3 4.1  CL 95* 105 107  CO2 19* 19* 19*  GLUCOSE 519* 269* 164*  BUN 60* 57* 31*  CREATININE 2.48* 2.33* 1.48*  CALCIUM 9.5 8.3* 8.4*   Liver Function Tests: No results for input(s): AST, ALT, ALKPHOS, BILITOT, PROT, ALBUMIN in the last 168 hours. No results for input(s): LIPASE, AMYLASE in the last 168 hours. No results for input(s): AMMONIA in the last 168 hours. CBC: Recent Labs  Lab 10/03/18 0141  WBC 9.7  NEUTROABS 7.0  HGB 12.1*  HCT 37.5*  MCV 91.2  PLT 232   Cardiac Enzymes: Recent Labs  Lab 10/03/18 0440  TROPONINI <0.03   BNP: Invalid input(s): POCBNP CBG: Recent Labs  Lab 10/03/18 2047 10/04/18 0009 10/04/18 0448 10/04/18 0743 10/04/18 1204  GLUCAP 270* 204* 151* 268* 386*   D-Dimer No results for input(s): DDIMER in the last 72 hours. Hgb A1c Recent Labs    10/03/18 0141  HGBA1C 11.1*   Lipid Profile No results for input(s): CHOL, HDL, LDLCALC, TRIG, CHOLHDL, LDLDIRECT in the last 72 hours. Thyroid function studies No results for input(s): TSH, T4TOTAL,  T3FREE, THYROIDAB in the last 72 hours.  Invalid input(s): FREET3 Anemia work up No results for input(s): VITAMINB12, FOLATE, FERRITIN, TIBC, IRON, RETICCTPCT in the last 72 hours. Urinalysis    Component Value Date/Time   COLORURINE YELLOW 10/03/2018 0327   APPEARANCEUR CLEAR 10/03/2018 0327   LABSPEC 1.013 10/03/2018 0327   PHURINE 5.0 10/03/2018 0327   GLUCOSEU >=500 (A) 10/03/2018 0327   HGBUR NEGATIVE 10/03/2018 0327   BILIRUBINUR NEGATIVE 10/03/2018 0327   BILIRUBINUR negative 05/25/2018 1318   KETONESUR 5 (A) 10/03/2018 0327   PROTEINUR 30 (A) 10/03/2018 0327   UROBILINOGEN 0.2 05/25/2018 1318   UROBILINOGEN 0.2 05/12/2017 0939   NITRITE NEGATIVE 10/03/2018 0327   LEUKOCYTESUR NEGATIVE 10/03/2018 0327   Sepsis Labs Invalid input(s): PROCALCITONIN,  WBC,  LACTICIDVEN Microbiology No results found for this or any previous visit (from the past 240 hour(s)).   Time coordinating discharge: 33  minutes  SIGNED:   Georgette Shell, MD  [facility] 10/04/2018, 12:25 PM   If 7PM-7AM, please contact night-coverage [email or web address] Password [email or web address]

## 2018-10-04 NOTE — Progress Notes (Addendum)
Dr. Rodena Piety paged and made aware that lunch CBG is 386. New orders to follow.

## 2018-10-04 NOTE — Care Management Note (Signed)
Case Management Note  Patient Details  Name: [name] [name] MRN: [id number] Date of Birth: May 08, 1946  Subjective/Objective: Spoke to patient in rm about d/c plans-he lives in a group home in has a rm.Has rw. Per staff patient's sister takes care of their mother who is 73 years old,& brother who has downs syndrome. Cm tried to contact sister [name] [phone] but no answer,& no vm to leave a message. Patient states his sister will pick him up to transport him to group home.Patient chose [facility] for HHC-HHRN/PT/OT/aide/csw-rep aren aware.No further CM needs.                    Action/Plan:dc home w/HHC.   Expected Discharge Date:  10/04/18               Expected Discharge Plan:  [place]  In-House Referral:     Discharge planning Services  CM Consult  Post Acute Care Choice:  Durable Medical Equipment(rw) Choice offered to:  Patient  DME Arranged:    DME Agency:     HH Arranged:  RN, PT, OT, Nurse's Aide, Social Work [employer] Agency:  [place]  Status of Service:  Completed, signed off  If discussed at [employer], dates discussed:    Additional Comments:  Dessa Phi, RN 10/04/2018, 12:57 PM

## 2018-10-04 NOTE — Progress Notes (Signed)
Per [facility] rep Santiago Glad she will check if they can accept-await outcome.

## 2018-10-04 NOTE — Progress Notes (Signed)
Inpatient Diabetes Program Recommendations  AACE/ADA: New Consensus Statement on Inpatient Glycemic Control (2015)  Target Ranges:  Prepandial:   less than 140 mg/dL      Peak postprandial:   less than 180 mg/dL (1-2 hours)      Critically ill patients:  140 - 180 mg/dL   Lab Results  Component Value Date   GLUCAP 268 (H) 10/04/2018   HGBA1C 11.1 (H) 10/03/2018    Review of Glycemic Control  Diabetes history: DM2 Outpatient Diabetes medications: Novolin 70/30 5-20 units 3-5x/day. Previously on Lantus 5 units QHS and Novolog 3 units tidwc, but lost insurance Current orders for Inpatient glycemic control: Novolog 70/30 8 units bid  HgbA1C - 11.1% - likely d/t non-compliance with insulin since losing insurance coverage. Will likely d/c to SNF.  Inpatient Diabetes Program Recommendations:     Add Novolog 0-9 units tidwc.  Will follow.   Thank you. Lorenda Peck, RD, LDN, CDE Inpatient Diabetes Coordinator [phone]

## 2018-10-04 NOTE — Progress Notes (Signed)
Pt discharged home today per Dr. Rodena Piety. Pt's IV site D/C'd and WDL. Pt's VSS. Pt and sister provided with home medication list, discharge instructions and prescriptions. Verbalized understanding. Pt left floor via WC in stable condition accompanied by NT.

## 2018-10-04 NOTE — Evaluation (Signed)
Physical Therapy Evaluation Patient Details Name: [name] [name] MRN: [id number] DOB: 10/18/1945 Today's Date: 10/04/2018   History of Present Illness  72 yo male admitted with uncontrolled DM, falls, Hx of CVA, Dm, CKD  Clinical Impression  On eval, pt required Min assist for mobility. He walked ~200 feet with use of a 4 wheeled walker. He walked ~15'x2 without an assistive device as well. Pt is very unsteady and remains at high risk for falls; especially without use of a walker. Highly recommend RW use for ambulation safety. Do not think pt is safe to walk unassisted or live alone at this time. Recommend 24 hour supervision/safety until he returns to his baseline and is able to function with modified independence. Will continue to follow.     Follow Up Recommendations Home health PT;Supervision/Assistance - 24 hour    Equipment Recommendations  Rolling walker with 5" wheels(if pt doesn't already have one)    Recommendations for Other Services       Precautions / Restrictions Precautions Precautions: Fall Restrictions Weight Bearing Restrictions: No      Mobility  Bed Mobility Overal bed mobility: Modified Independent                Transfers Overall transfer level: Needs assistance Equipment used: None Transfers: Sit to/from Stand Sit to Stand: Min assist         General transfer comment: LOB x 1 posteriorly back on to bed. Unsteady. Assist to stabilize  Ambulation/Gait Ambulation/Gait assistance: Min assist Gait Distance (Feet): 200 Feet(15'x2 without support) Assistive device: 4-wheeled walker;None Gait Pattern/deviations: Step-through pattern;Trunk flexed;Decreased stride length     General Gait Details: Unsteady. Assist to stabilize required especially if pt walks without UE support of walker or IV pole.   Stairs            Wheelchair Mobility    Modified Rankin (Stroke Patients Only)       Balance Overall balance assessment: History of  Falls;Needs assistance         Standing balance support: Bilateral upper extremity supported Standing balance-Leahy Scale: Poor                               Pertinent Vitals/Pain Pain Assessment: No/denies pain    Home Living Family/patient expects to be discharged to:: Unsure     Type of Home: Group Home         Home Equipment: Walker - 2 wheels      Prior Function Level of Independence: Independent         Comments: has a walker but does not use it     Hand Dominance        Extremity/Trunk Assessment   Upper Extremity Assessment Upper Extremity Assessment: Generalized weakness    Lower Extremity Assessment Lower Extremity Assessment: Generalized weakness    Cervical / Trunk Assessment Cervical / Trunk Assessment: Normal  Communication   Communication: Expressive difficulties  Cognition Arousal/Alertness: Awake/alert Behavior During Therapy: WFL for tasks assessed/performed Overall Cognitive Status: Within Functional Limits for tasks assessed                                 General Comments: pt tearful during session-crying about loss of his daughter      General Comments      Exercises     Assessment/Plan    PT Assessment Patient needs continued  PT services  PT Problem List Decreased balance;Decreased mobility;Decreased strength;Decreased knowledge of use of DME;Decreased safety awareness       PT Treatment Interventions DME instruction;Gait training;Functional mobility training;Therapeutic activities;Balance training;Patient/family education;Therapeutic exercise    PT Goals (Current goals can be found in the Care Plan section)  Acute Rehab PT Goals Patient Stated Goal: home soon PT Goal Formulation: With patient Time For Goal Achievement: 10/18/18 Potential to Achieve Goals: Good    Frequency Min 3X/week   Barriers to discharge        Co-evaluation               AM-PAC PT "6 Clicks" Mobility   Outcome Measure Help needed turning from your back to your side while in a flat bed without using bedrails?: None Help needed moving from lying on your back to sitting on the side of a flat bed without using bedrails?: None Help needed moving to and from a bed to a chair (including a wheelchair)?: A Little Help needed standing up from a chair using your arms (e.g., wheelchair or bedside chair)?: A Little Help needed to walk in hospital room?: A Little Help needed climbing 3-5 steps with a railing? : A Little 6 Click Score: 20    End of Session Equipment Utilized During Treatment: Gait belt Activity Tolerance: Patient tolerated treatment well Patient left: in bed;with call bell/phone within reach;with bed alarm set   PT Visit Diagnosis: Muscle weakness (generalized) (M62.81);Unsteadiness on feet (R26.81);History of falling (Z91.81)    Time: 7371-0626 PT Time Calculation (min) (ACUTE ONLY): 17 min   Charges:   PT Evaluation $PT Eval Moderate Complexity: [place], PT Acute Rehabilitation Services Pager: [phone] Office: [phone]

## 2018-10-04 NOTE — Progress Notes (Signed)
Confirmed [facility] will accept-rep Santiago Glad following.

## 2018-10-08 IMAGING — DX DG CHEST 1V PORT
1 series · 1 of 1 positions shown · non-contrast
Comparison: 07/25/2017 and prior exams

CLINICAL DATA: Altered level of consciousness, increased
respiratory rate and hypothermia.

EXAM:
PORTABLE CHEST 1 VIEW

[chest ap]
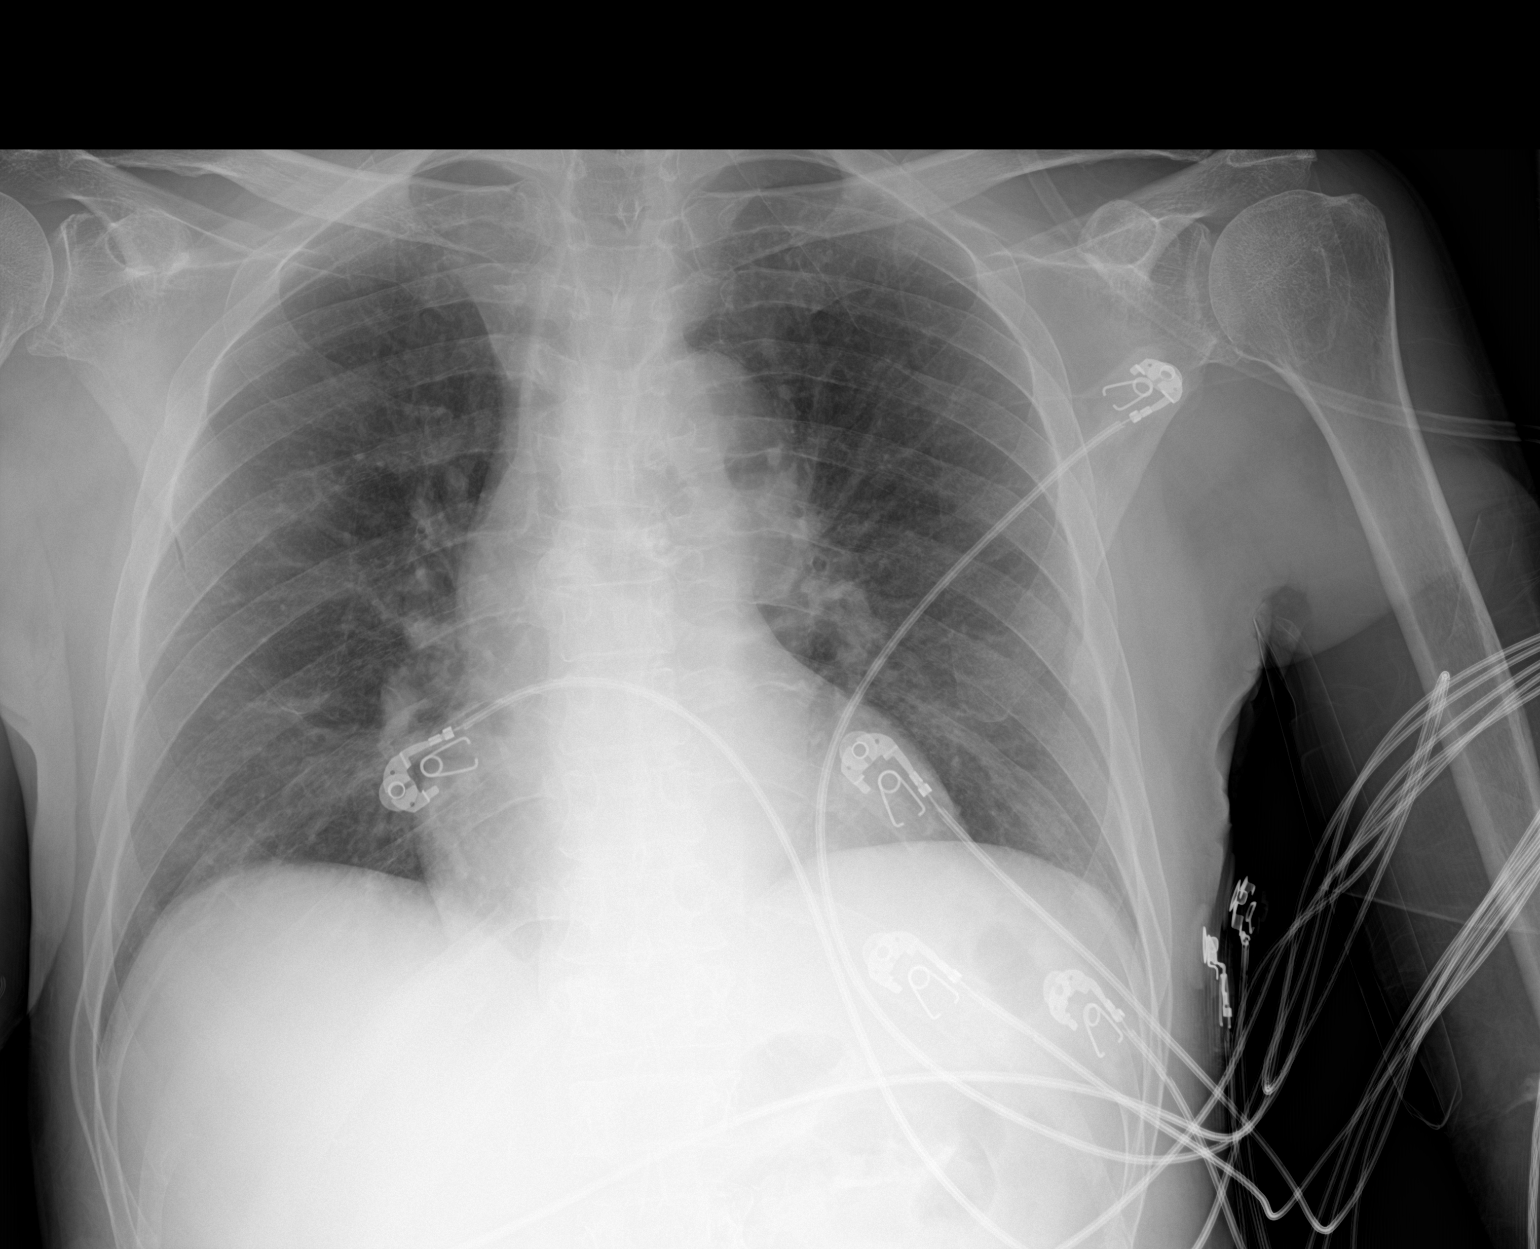

[1 of 1 positions shown; findings below may reference images not displayed]

FINDINGS: The cardiomediastinal silhouette is unremarkable.

There is no evidence of focal airspace disease, pulmonary edema,
suspicious pulmonary nodule/mass, pleural effusion, or pneumothorax.

No acute bony abnormalities are identified.
IMPRESSION: No active disease.

## 2018-10-08 IMAGING — DX DG CHEST 1V PORT
1 series · 1 of 1 positions shown · non-contrast
Comparison: 08/01/2017

CLINICAL DATA: Status post intubation

EXAM:
PORTABLE CHEST 1 VIEW

[chest ap]
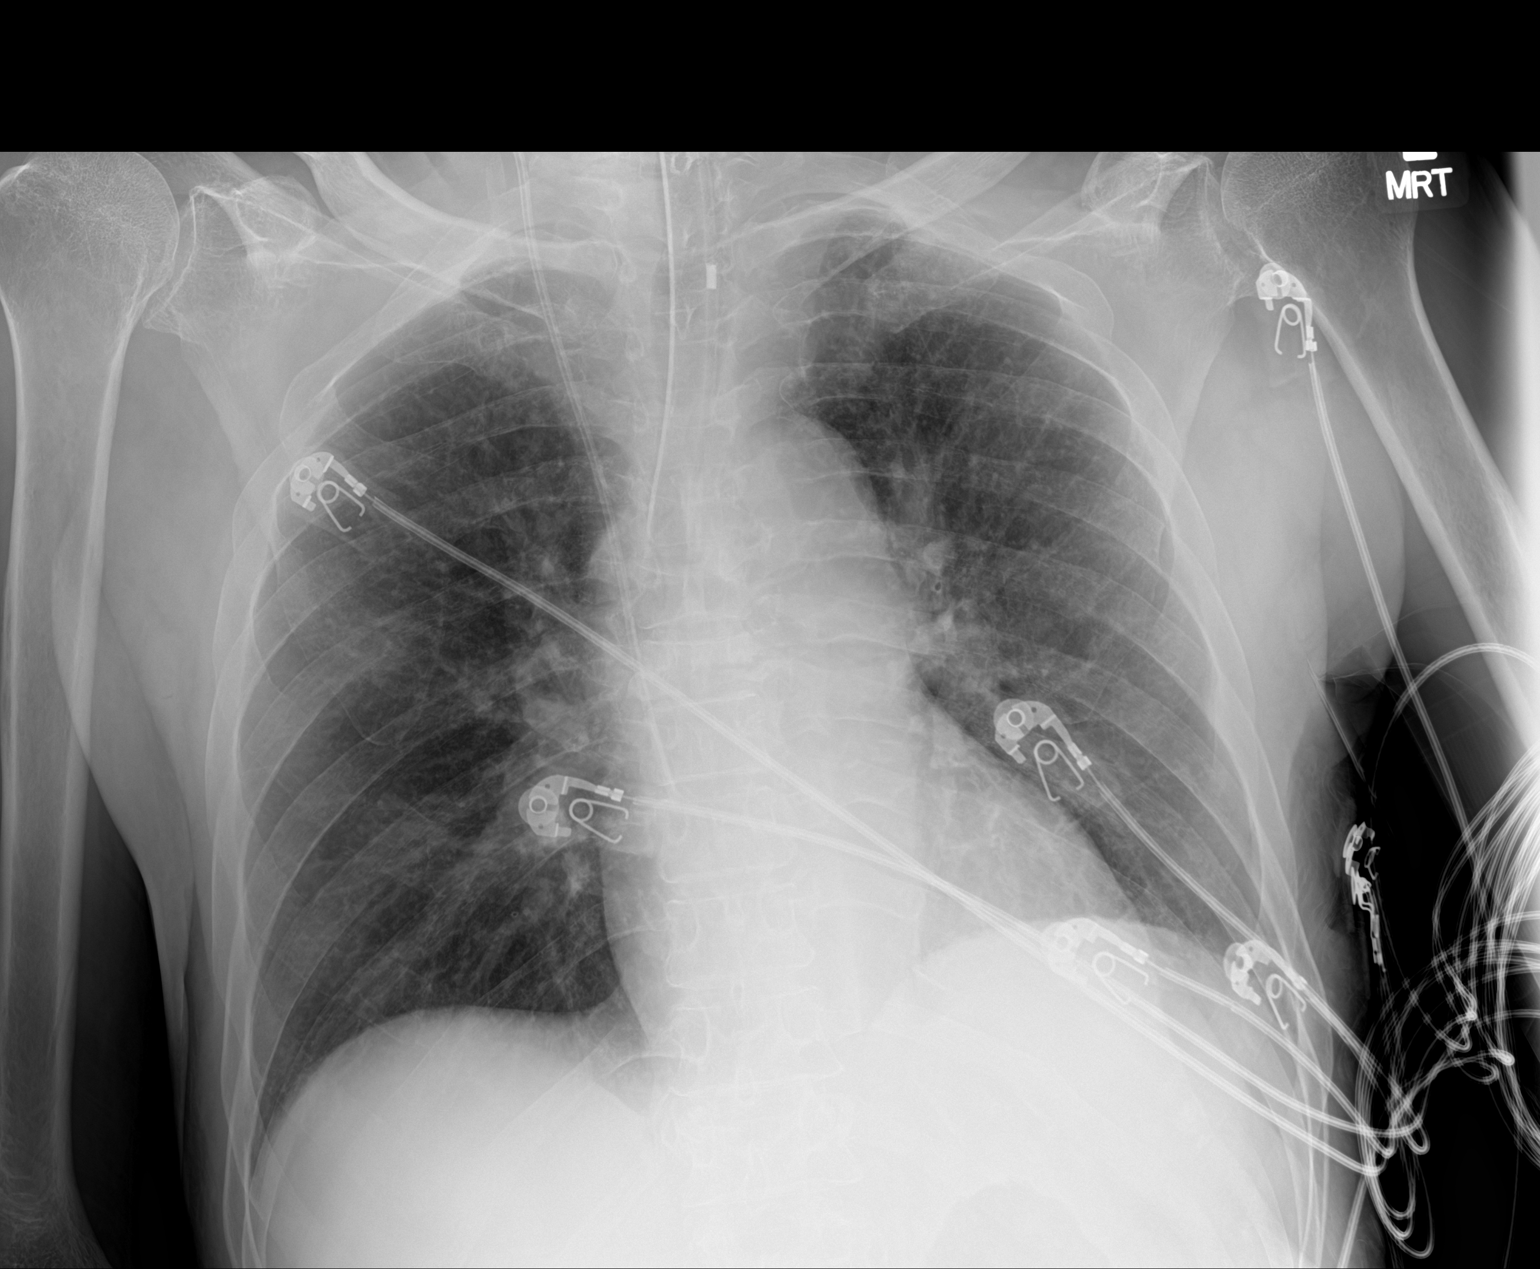

[1 of 1 positions shown; findings below may reference images not displayed]

FINDINGS: Cardiac shadow is stable. Right jugular central line is noted at the
cavoatrial junction. Endotracheal tube is seen in the distal trachea
approximately 1.7 cm above the carina. The lungs are well aerated
bilaterally. No acute bony abnormality is seen. No focal infiltrate
is noted.
IMPRESSION: Tubes and lines as described.  No change from the prior exam.

## 2018-10-09 IMAGING — CR DG CHEST 1V PORT
1 series · 1 of 1 positions shown · non-contrast
Comparison: 08/01/2017.

CLINICAL DATA: Respiratory failure.

EXAM:
PORTABLE CHEST 1 VIEW

[AP]
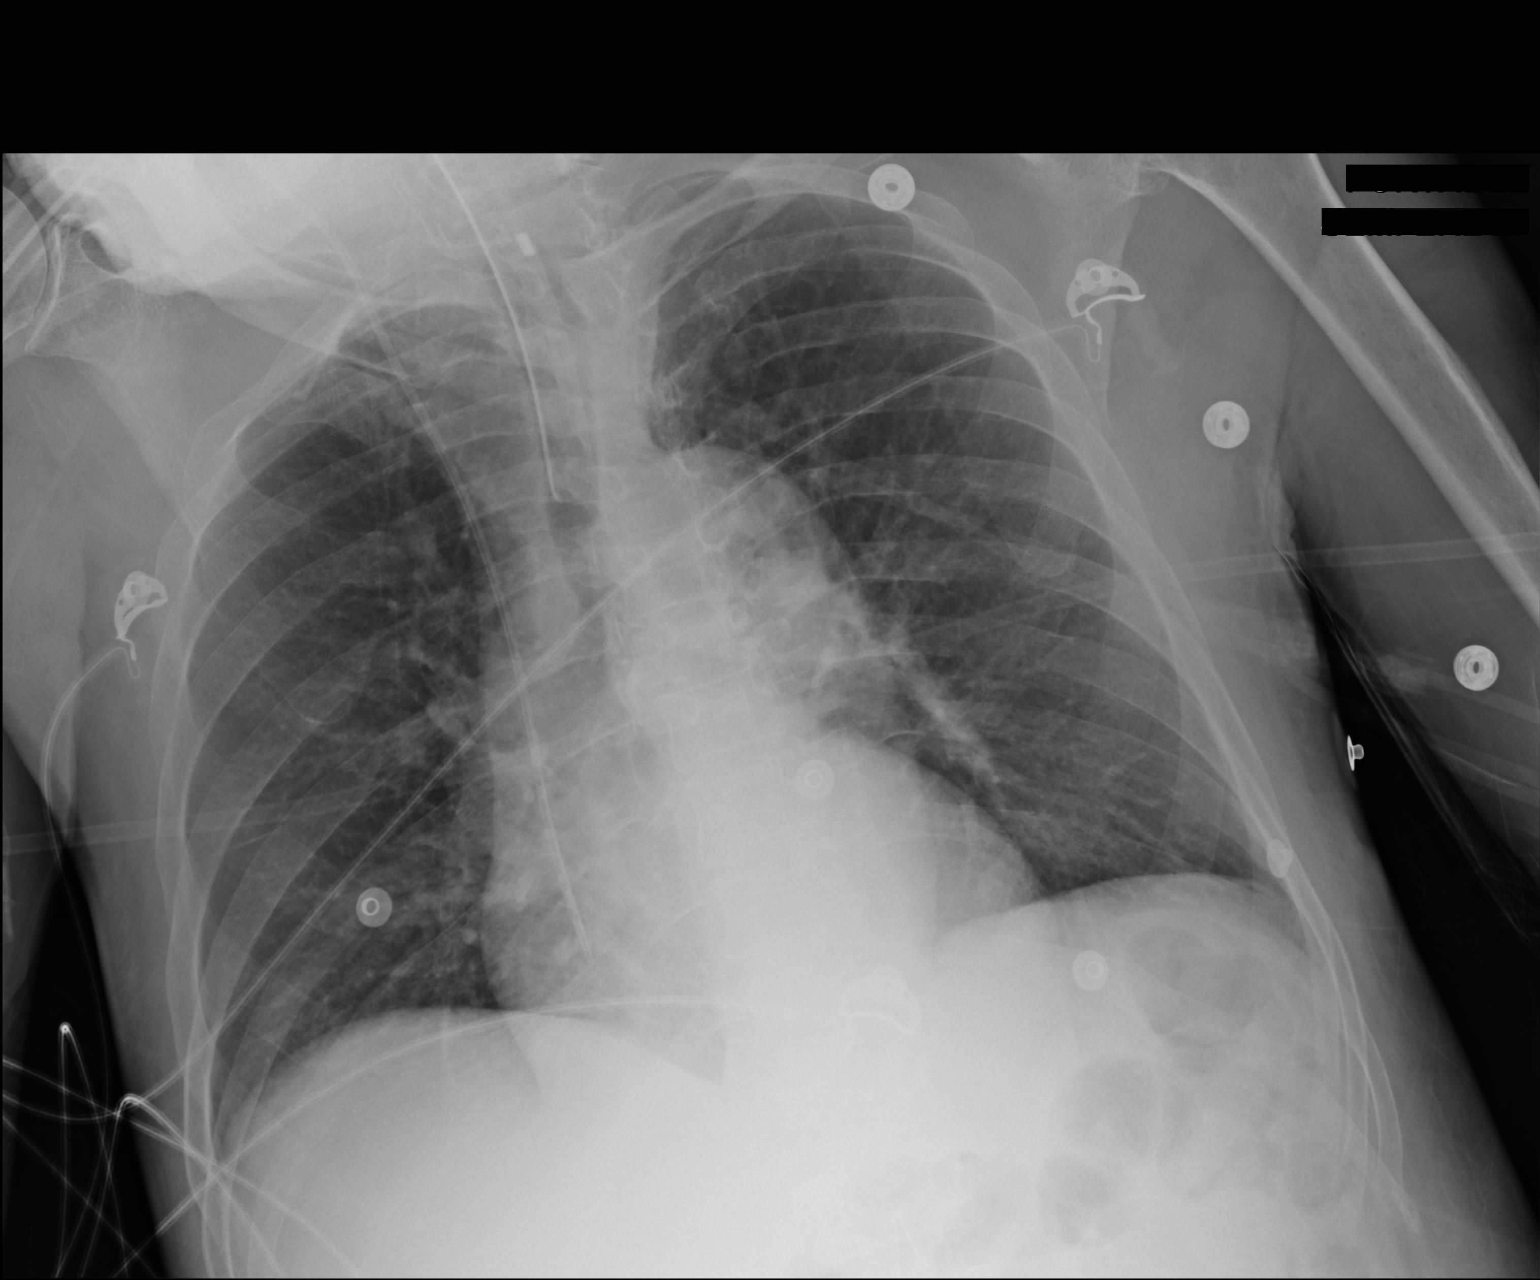

[1 of 1 positions shown; findings below may reference images not displayed]

FINDINGS: Stable and unchanged cardiomediastinal silhouette. ET tube has been
pulled back, now 4.1 cm above carina, satisfactory position. Center
venous catheter tip lies within the RIGHT atrium, approximately 4 cm
below the cavoatrial junction. Rotated radiograph demonstrates mild
vascular congestion without consolidation or edema. No effusion or
pneumothorax. Slight worsening aeration compared with priors.
IMPRESSION: ET tube 4 cm above carina. Central venous catheter tip lies well
within the RIGHT atrium. Mild vascular congestion is developing.

## 2018-10-09 IMAGING — CR DG ABD PORTABLE 1V
1 series · 1 of 1 positions shown · non-contrast
Comparison: None.

CLINICAL DATA: Encounter for nasogastric tube placement.

EXAM:
PORTABLE ABDOMEN - 1 VIEW

[AP]
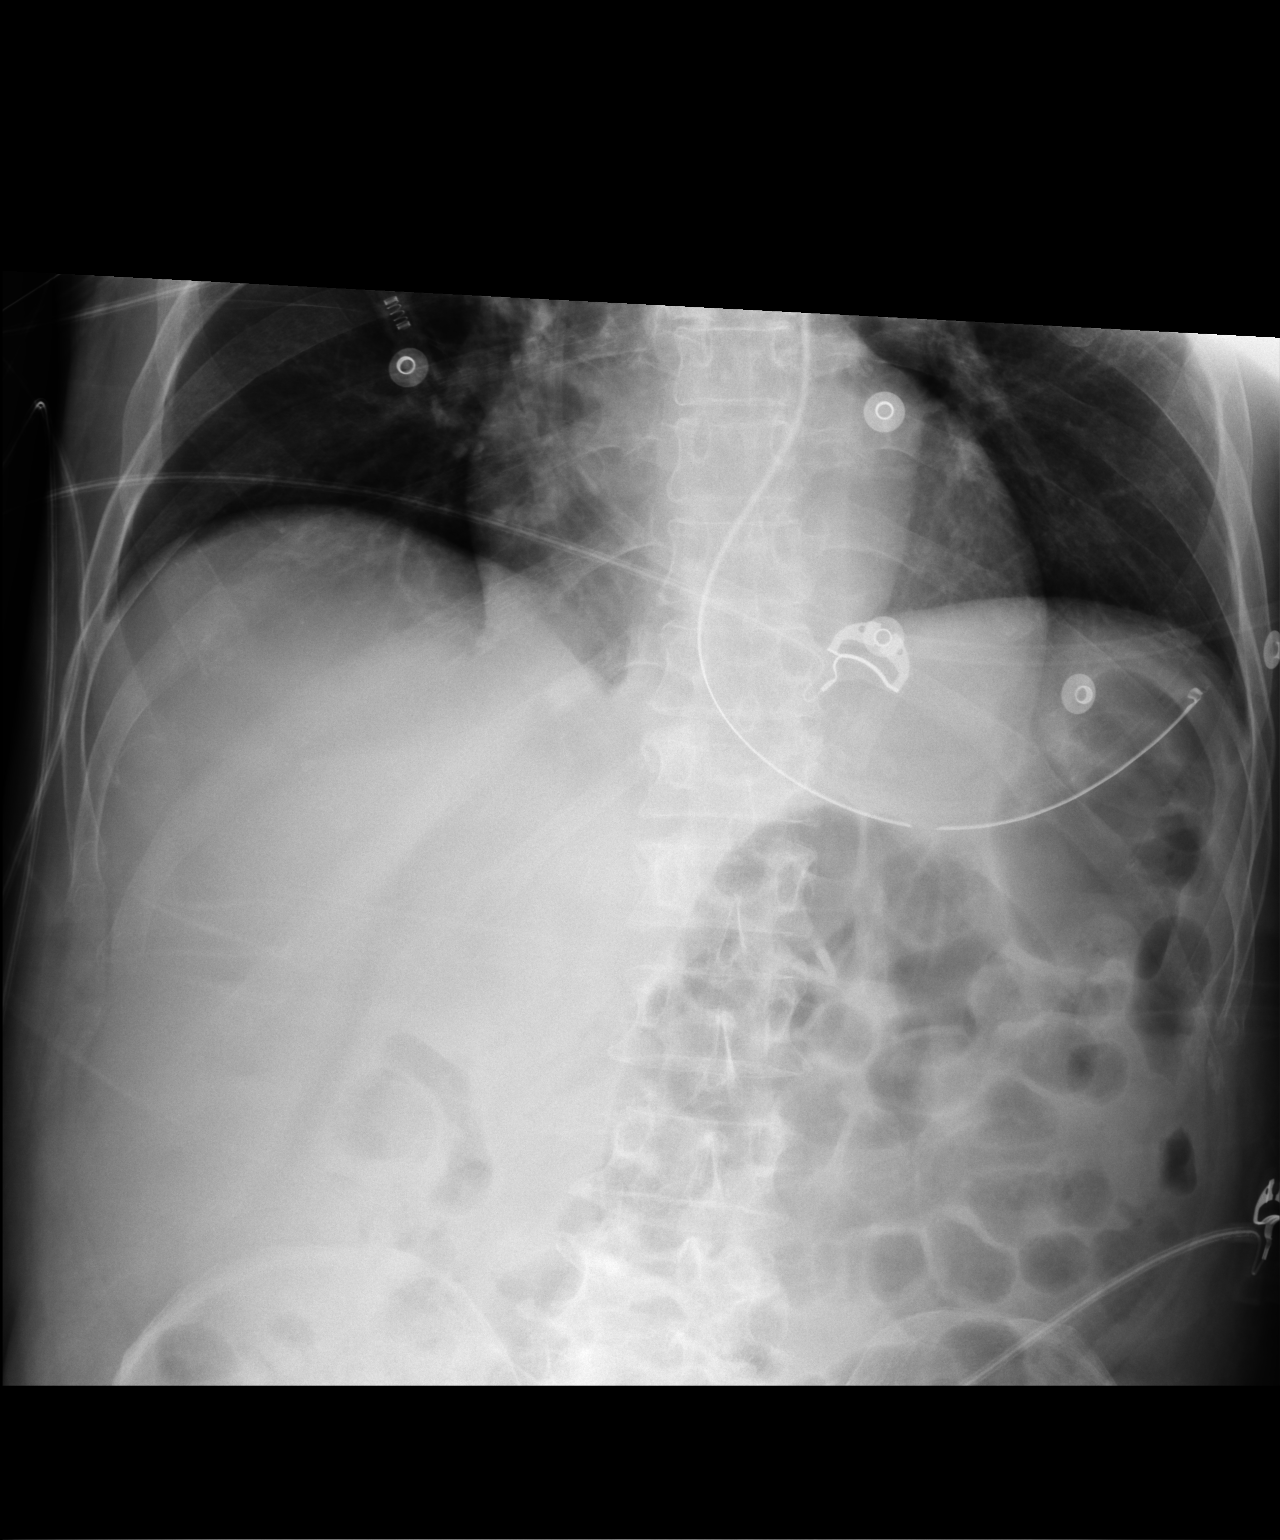

[1 of 1 positions shown; findings below may reference images not displayed]

FINDINGS: Nasogastric tube has been placed and overlies the LEFT upper
quadrant. Presumably this lies within the stomach, with the tip
directed toward the fundus. Visualized bowel gas pattern appears
nonobstructive.
IMPRESSION: Nasogastric tube tip is directed upward into the LEFT upper
quadrant, likely into the fundus of the stomach.

## 2018-10-10 IMAGING — DX DG CHEST 1V PORT
1 series · 1 of 1 positions shown · non-contrast
Comparison: One-view chest x-ray 08/02/2017

CLINICAL DATA: Endotracheal tube.  Diabetic ketoacidosis.

EXAM:
PORTABLE CHEST 1 VIEW

[chest ap]
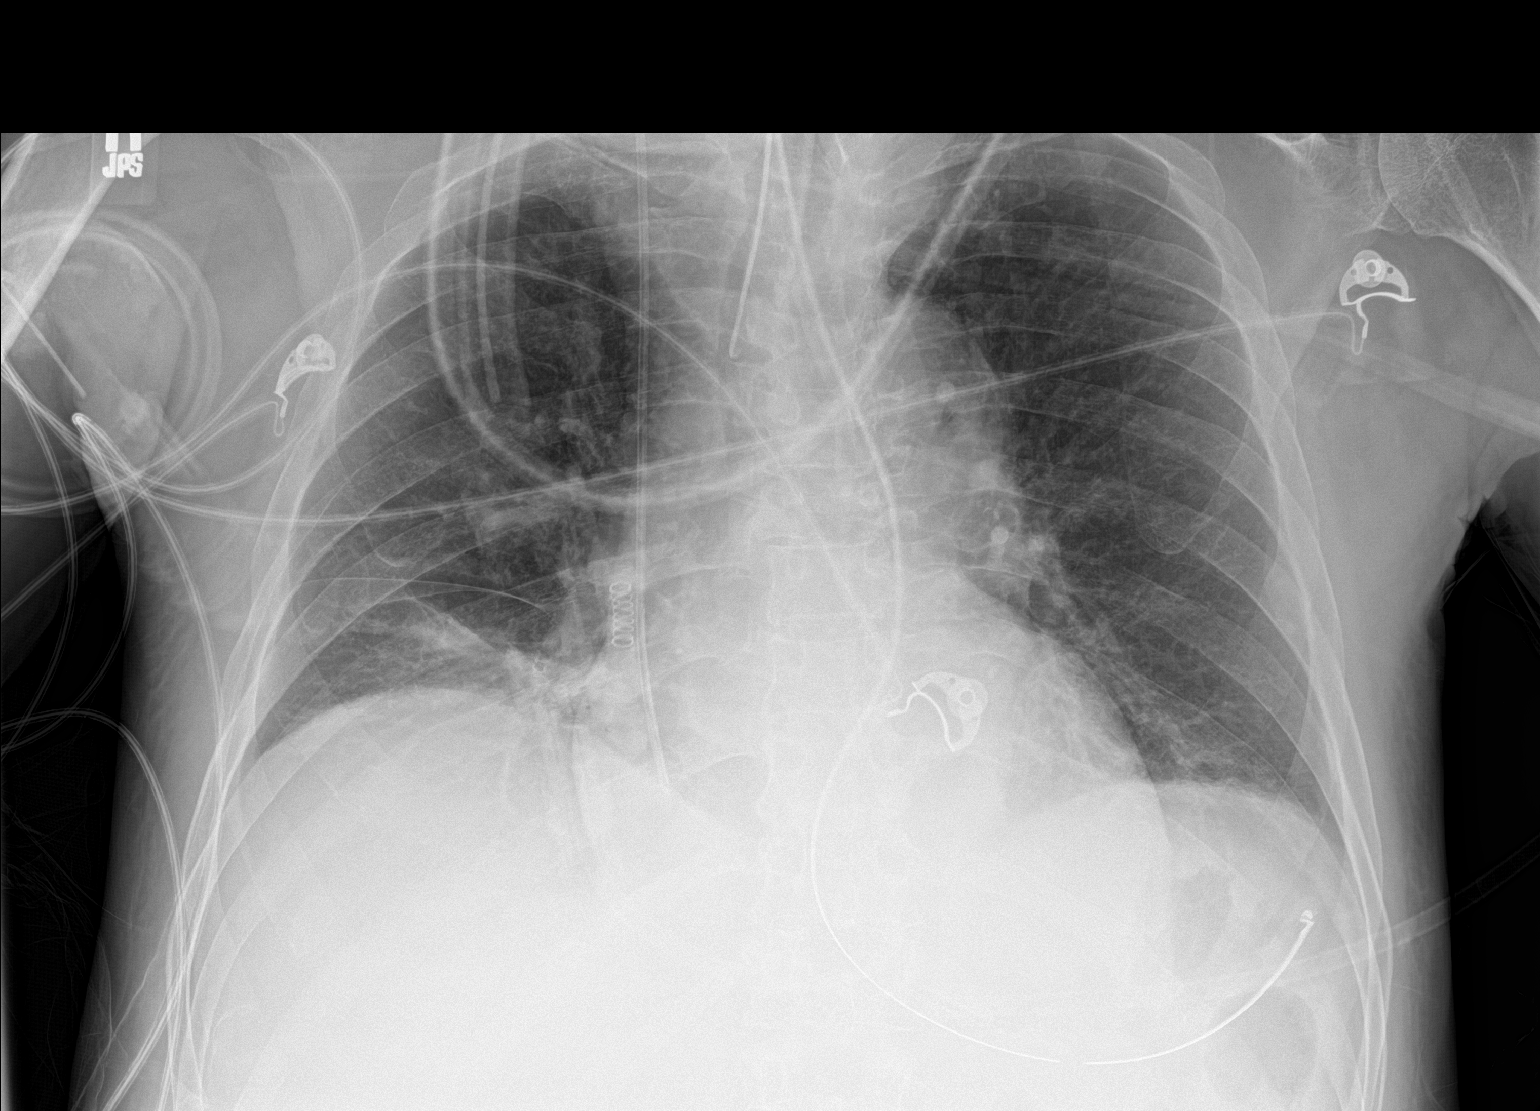

[1 of 1 positions shown; findings below may reference images not displayed]

FINDINGS: The endotracheal tube is stable, 4 cm above the carina. A right IJ
line is stable, just be on the cavoatrial junction. A NG tube
terminates in the stomach.

The heart is mildly enlarged. There is progressive volume loss with
bibasilar airspace disease, likely atelectasis. There is no edema or
effusion. No pneumothorax is present.
IMPRESSION: 1. Progressive volume loss and bibasilar airspace disease, likely
atelectasis.
2. The support apparatus is stable.

## 2018-10-12 IMAGING — DX DG CHEST 1V PORT
1 series · 1 of 1 positions shown · non-contrast
Comparison: 08/03/2017

CLINICAL DATA: Respiratory failure.  Ventilator support.

EXAM:
PORTABLE CHEST 1 VIEW

[chest ap]
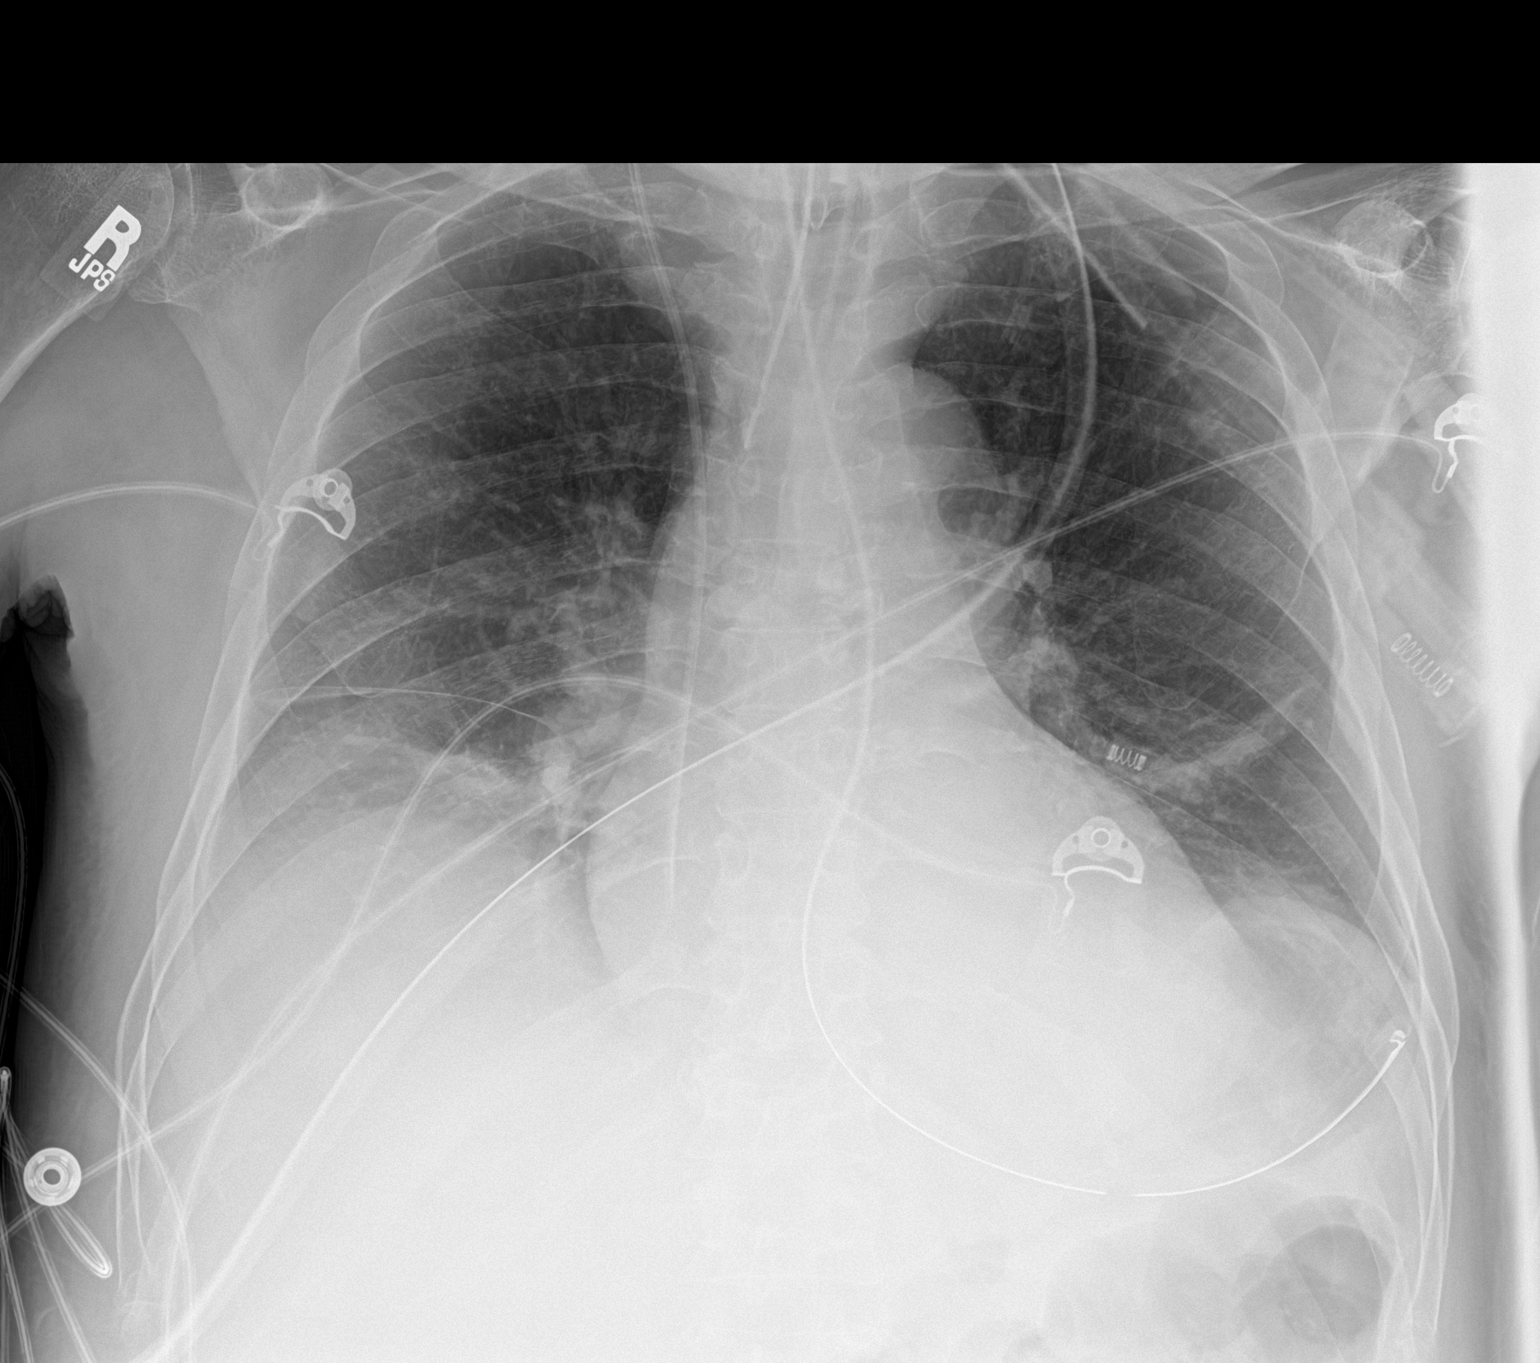

[1 of 1 positions shown; findings below may reference images not displayed]

FINDINGS: Endotracheal tube tip is 3 cm above the carina. Nasogastric tube
enters the stomach. Right internal jugular central line tip is in
the right atrium as seen yesterday. Moderate basilar atelectasis
persists, slightly worsened particularly on the left.
IMPRESSION: Endotracheal tube and nasogastric tube well positioned. Internal
jugular central line tip again within the right atrium. Slight
worsening of basilar atelectasis particularly on the left.

## 2018-10-13 IMAGING — DX DG CHEST 1V PORT
1 series · 1 of 1 positions shown · non-contrast
Comparison: 08/05/2017

CLINICAL DATA: Respiratory failure

EXAM:
PORTABLE CHEST 1 VIEW

[chest ap]
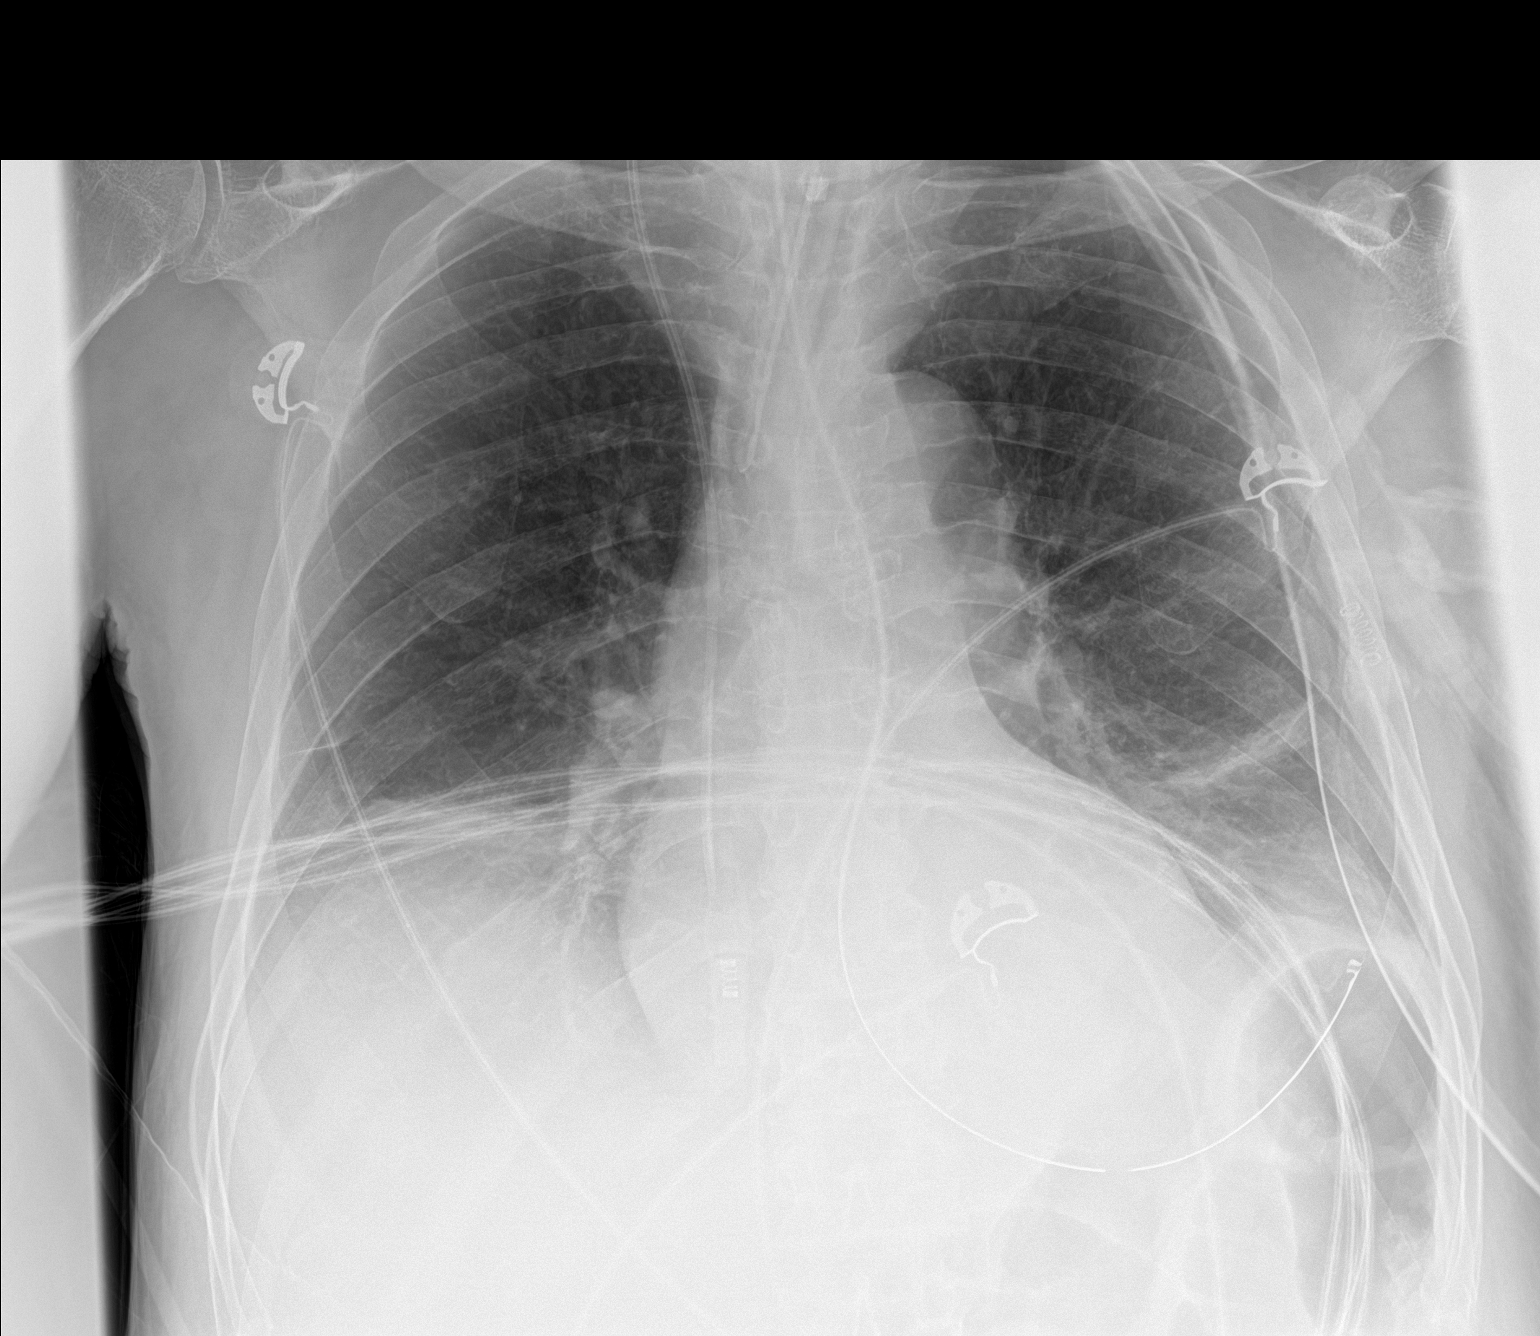

[1 of 1 positions shown; findings below may reference images not displayed]

FINDINGS: Endotracheal tube, nasogastric catheter and right jugular central
line are again noted and stable. Cardiac shadow is stable. Bibasilar
atelectatic changes are again seen without significant change. No
acute bony abnormality is noted.
IMPRESSION: Stable bibasilar atelectasis.

Tubes and lines as described.

## 2018-10-16 IMAGING — CT CT HEAD W/O CM
4 series · 16 of 47 positions shown, 18 images · non-contrast
Comparison: CT HEAD December 13, 2016

CLINICAL DATA: Combative, did not want to Charity up after Ambien.
History of hypertension, diabetes and stroke.

EXAM:
CT HEAD WITHOUT CONTRAST
TECHNIQUE: Contiguous axial images were obtained from the base of the skull
through the vertex without intravenous contrast.

[Series 3: head without · axial · non-contrast · 0.42mm/px · z∈[-61,+59]mm · 7 of 32 slices shown, 9 images]
[im 4/32  brain]
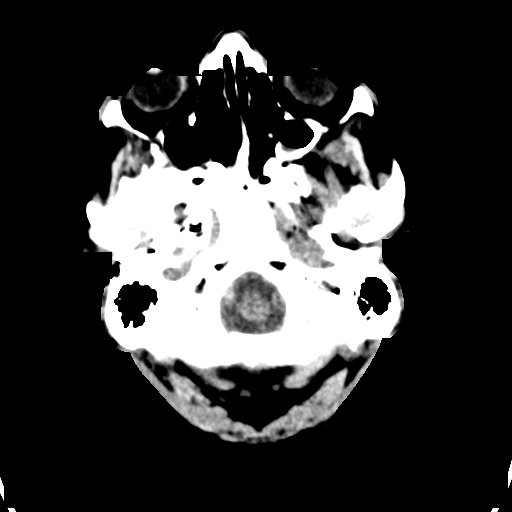
[im 4/32  bone]
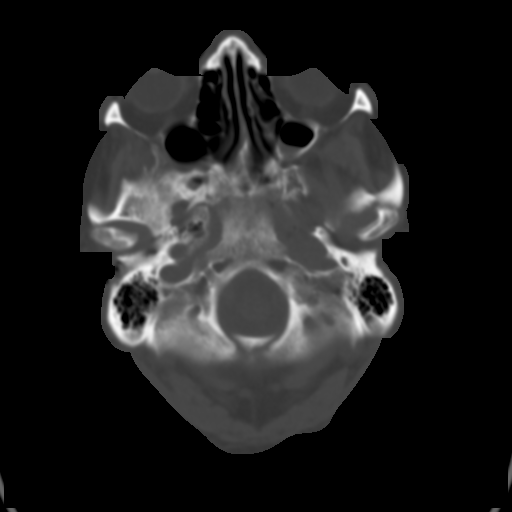
[im 8/32  brain]
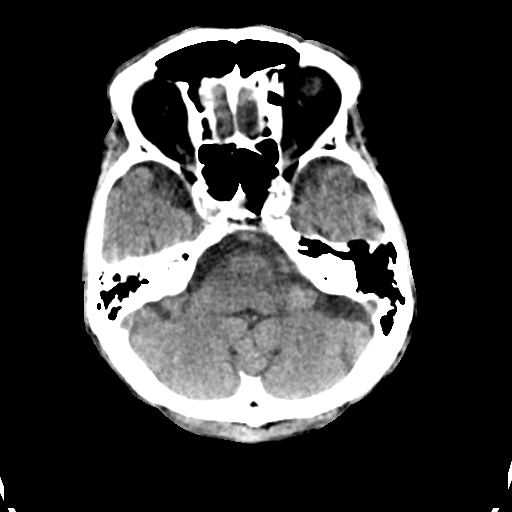
[im 12/32  brain]
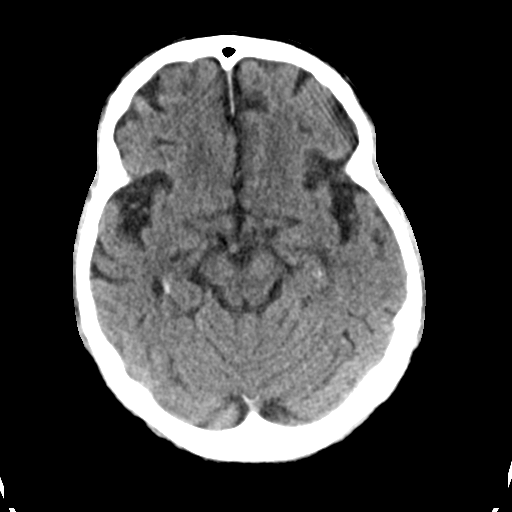
[im 16/32  brain]
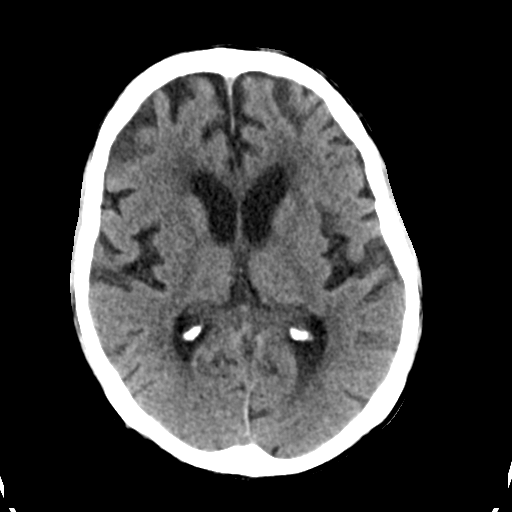
[im 20/32  brain]
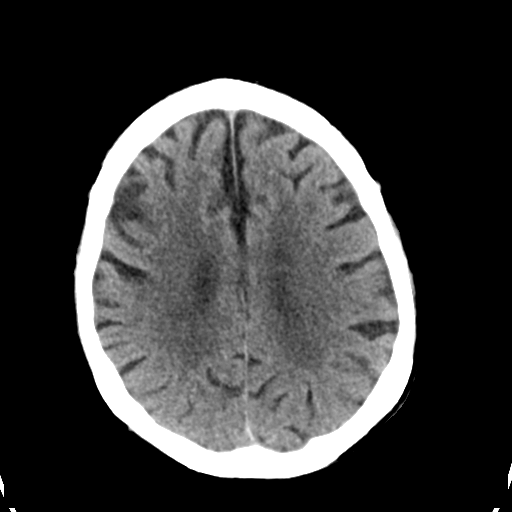
[im 20/32  bone]
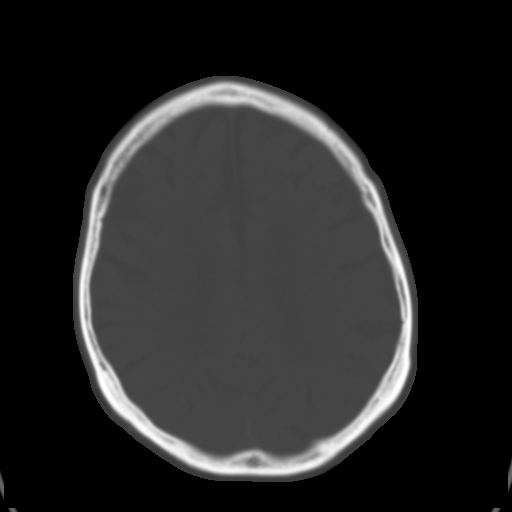
[im 24/32  brain]
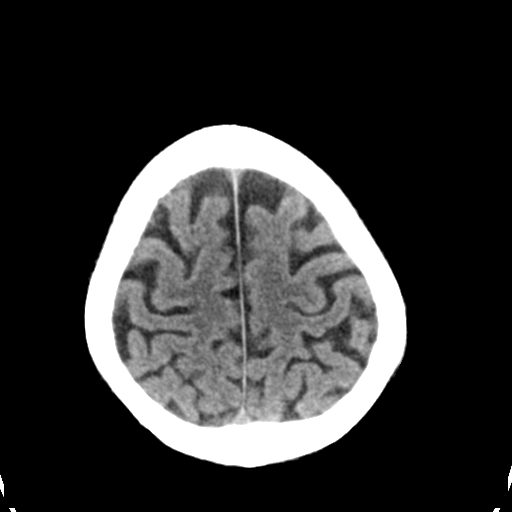
[im 28/32  brain]
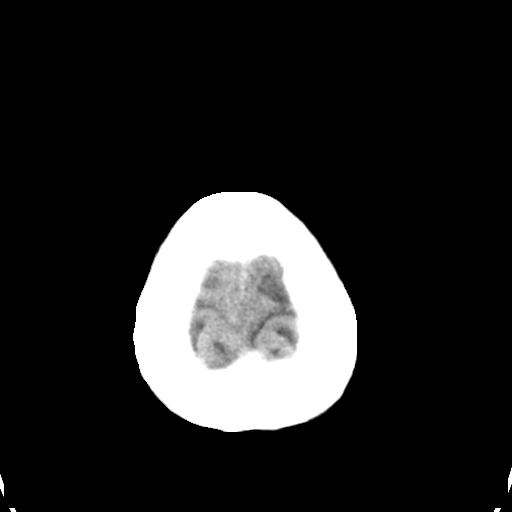

[Series 4: head bone · axial · 0.42mm/px · z∈[-62,-30]mm · 3 of 79 slices shown]
[im 8/79  bone]
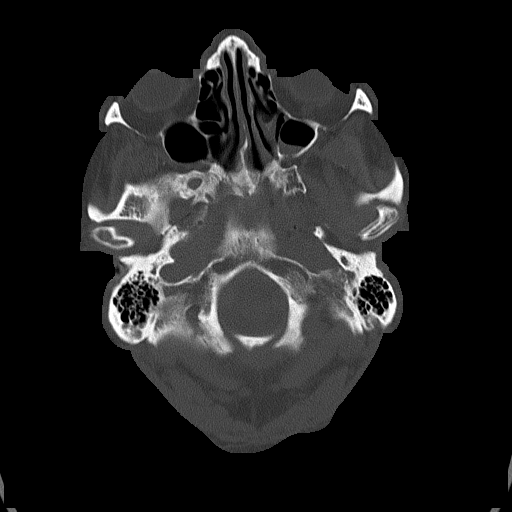
[im 16/79  bone]
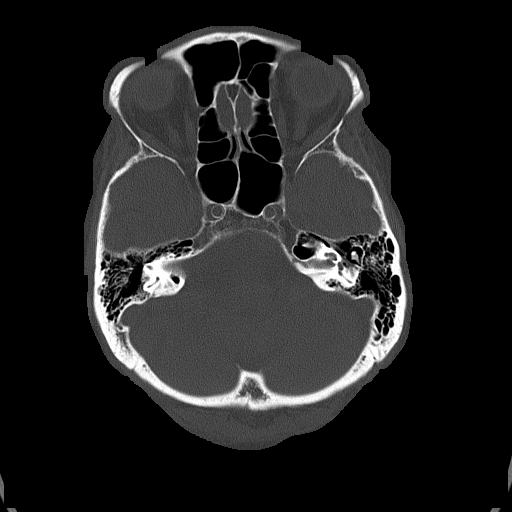
[im 24/79  bone]
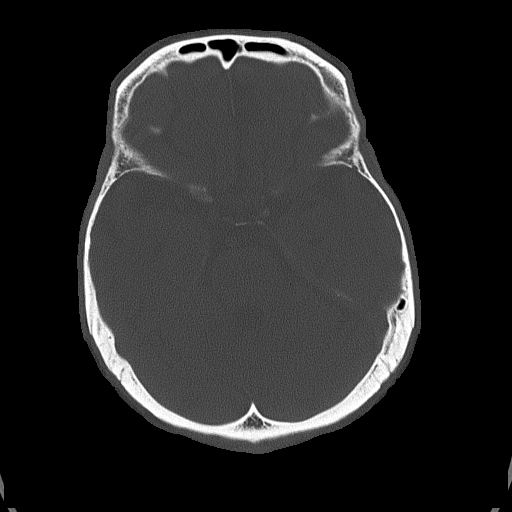

[Series 5: head without cor · coronal · non-contrast · 0.31mm/px · 3 of 67 slices shown]
[im 23/67  brain]
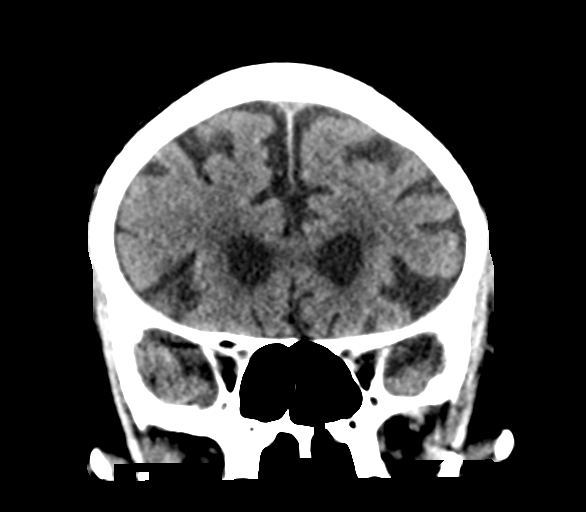
[im 30/67  brain]
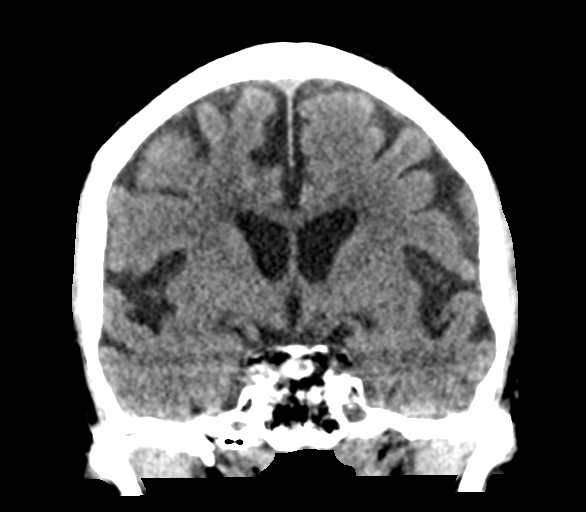
[im 37/67  brain]
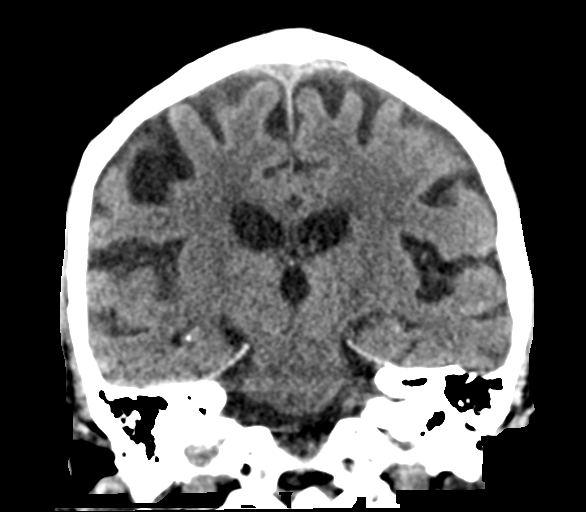

[Series 6: head without sag · sagittal · non-contrast · 0.32mm/px · 3 of 64 slices shown]
[im 22/64  brain]
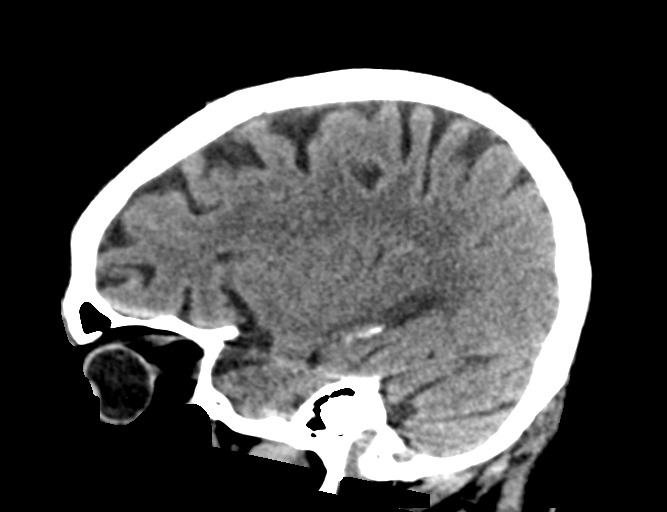
[im 32/64  brain]
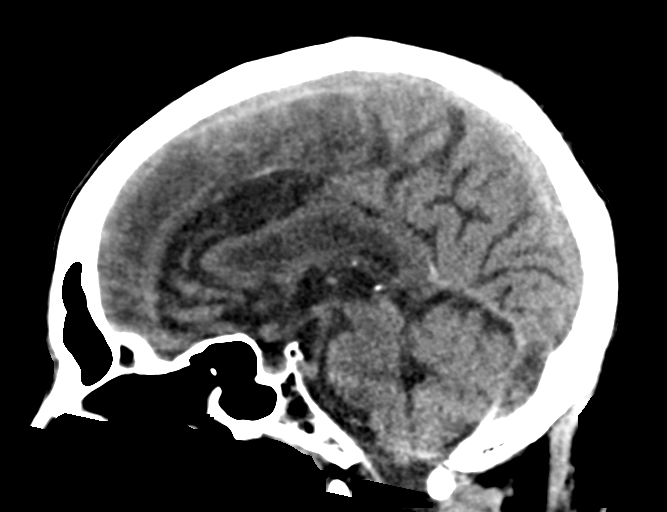
[im 43/64  brain]
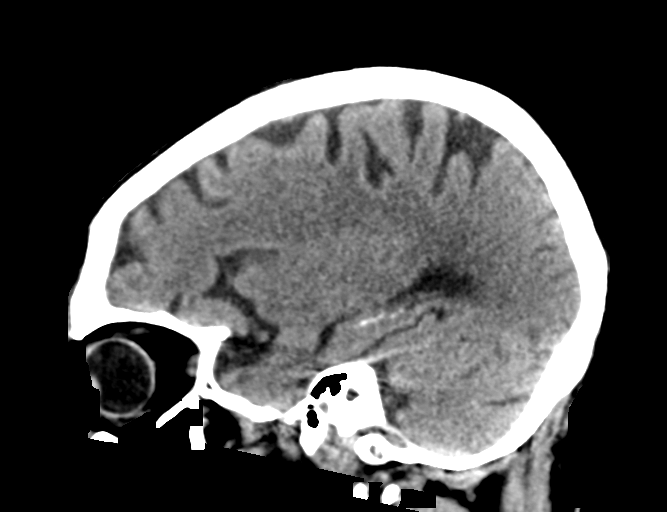

[16 of 47 positions shown; findings below may reference images not displayed]

FINDINGS: BRAIN: No intraparenchymal hemorrhage, mass effect nor midline
shift. The ventricles and sulci are normal for age. Patchy
supratentorial white matter hypodensities less than expected for
patient's age, though non-specific are most compatible with chronic
small vessel ischemic disease. No acute large vascular territory
infarcts. No abnormal extra-axial fluid collections. Basal cisterns
are patent.

VASCULAR: Moderate calcific atherosclerosis of the carotid siphons.

SKULL: No skull fracture. No significant scalp soft tissue swelling.

SINUSES/ORBITS: Small LEFT sphenoid sinus air-fluid level. Mastoid
air cells are well aerated. The included ocular globes and orbital
contents are non-suspicious.

OTHER: None.
IMPRESSION: Stable negative noncontrast CT HEAD.

## 2018-10-17 IMAGING — RF DG SWALLOWING FUNCTION - NRPT [facility]
1 series · 18 of 24 positions shown · non-contrast
Comparison: none

[Series 1: run · 16 acquisitions, 18 frames shown]
[im 1/16]
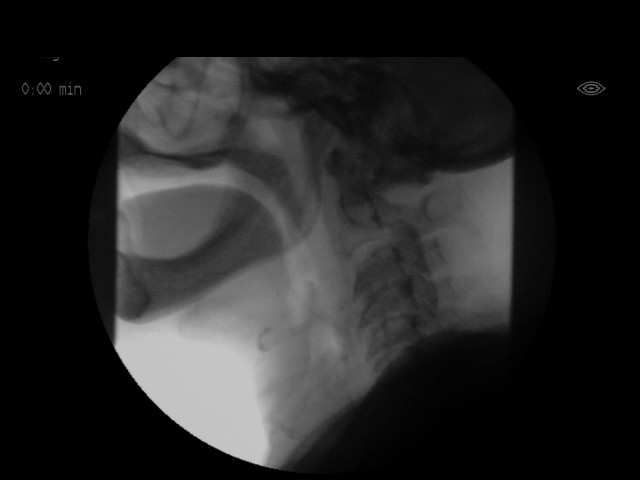
[im 2/16]
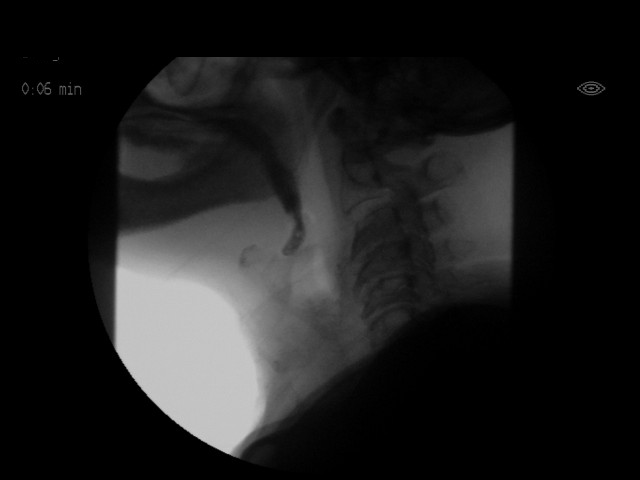
[im 3/16]
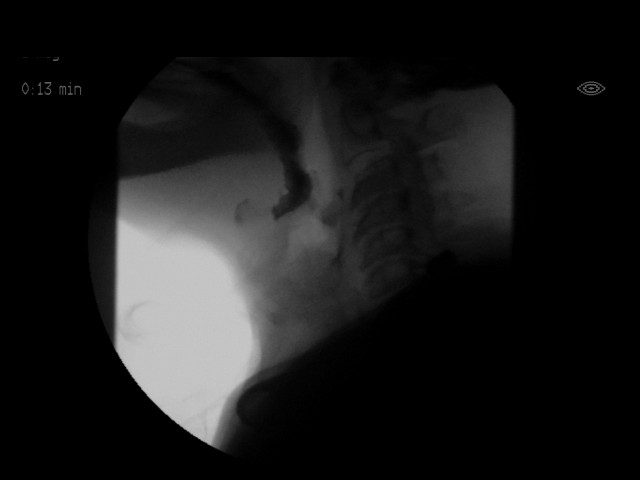
[im 3/16]
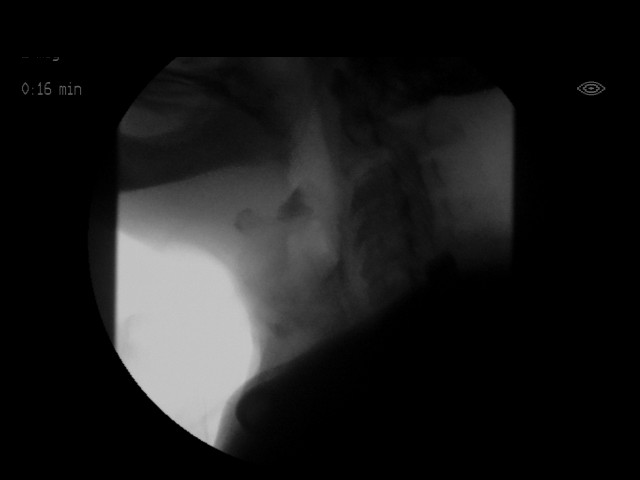
[im 5/16]
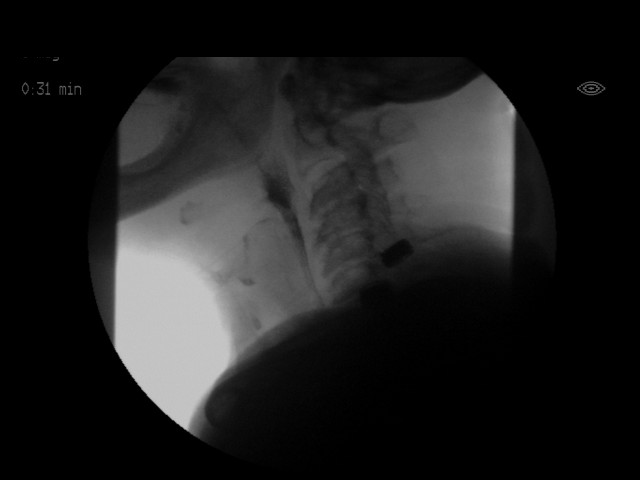
[im 5/16]
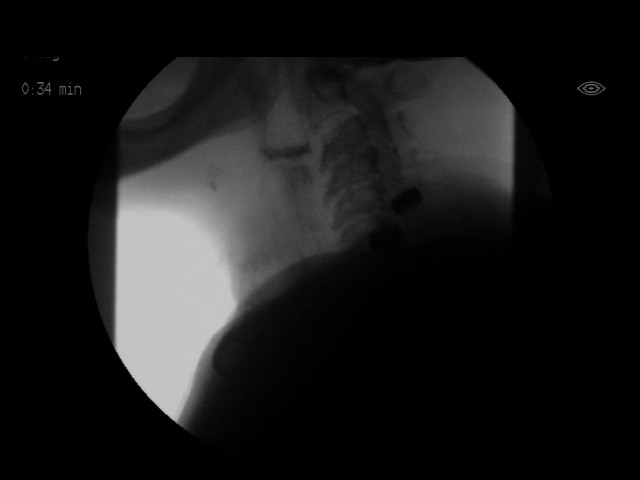
[im 6/16]
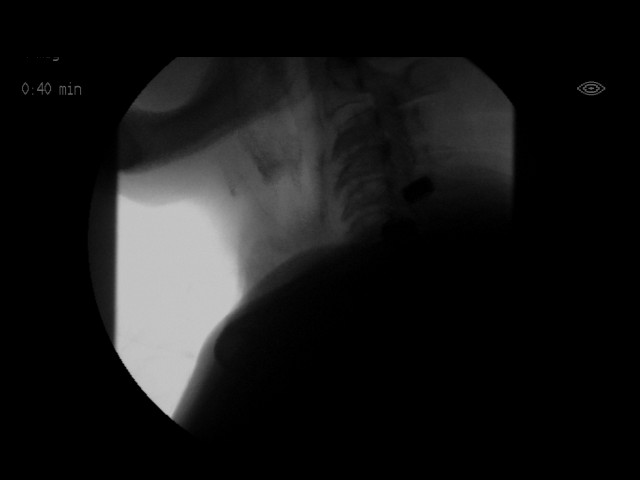
[im 7/16]
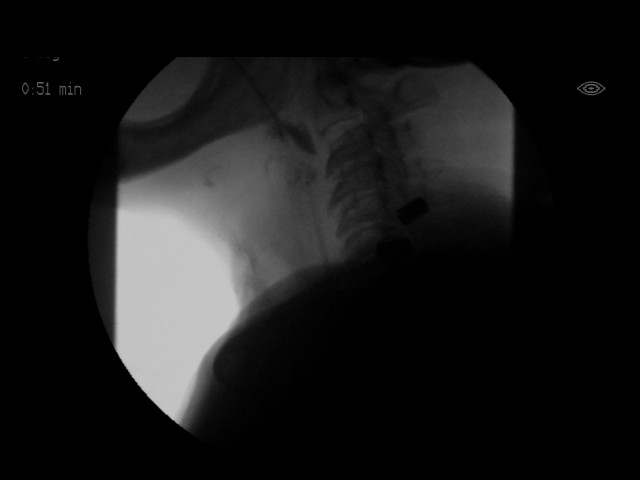
[im 8/16]
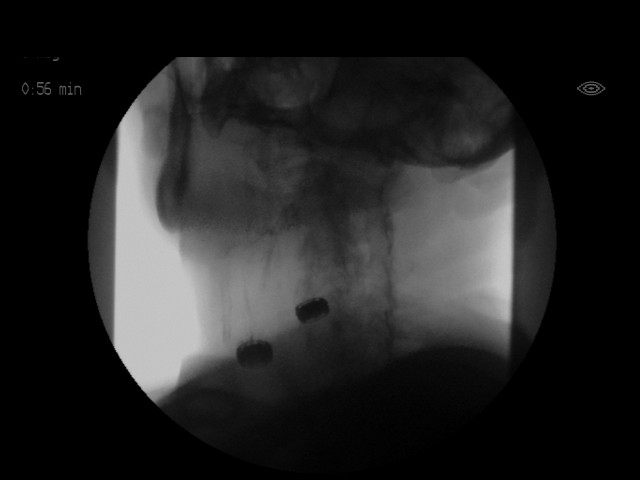
[im 9/16]
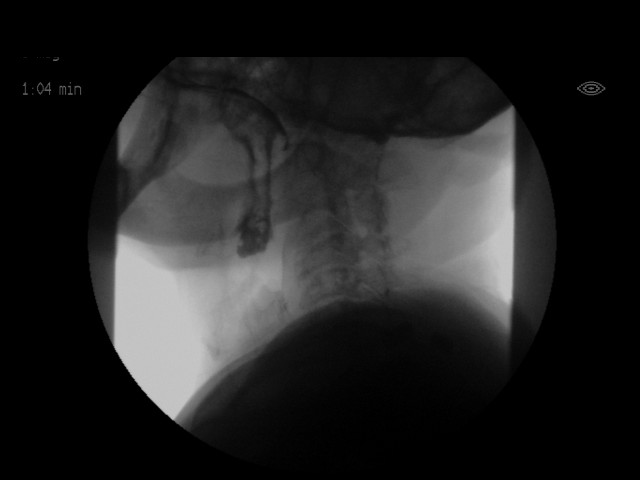
[im 10/16]
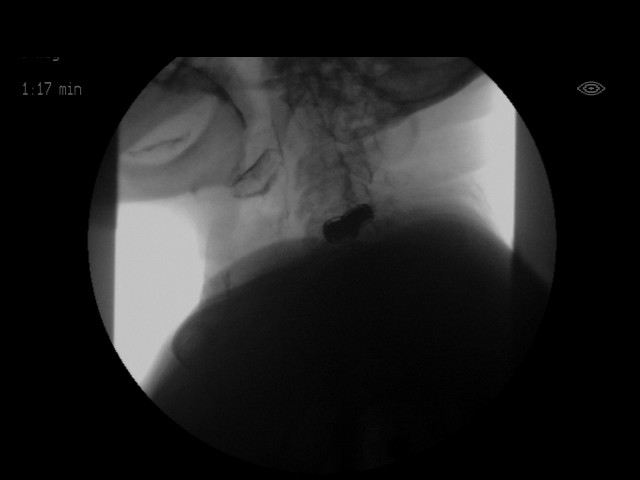
[im 11/16]
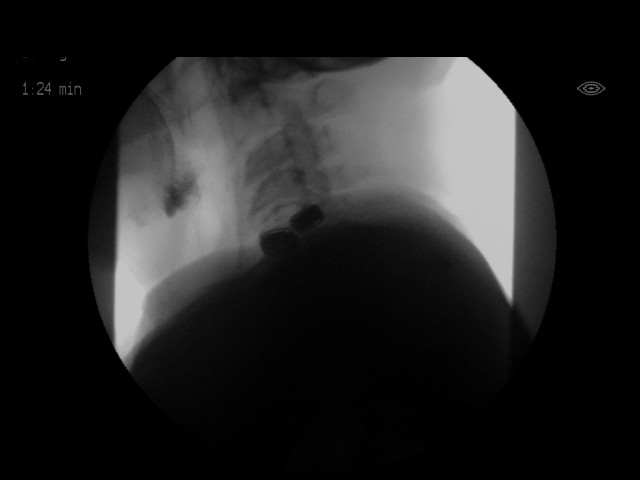
[im 12/16]
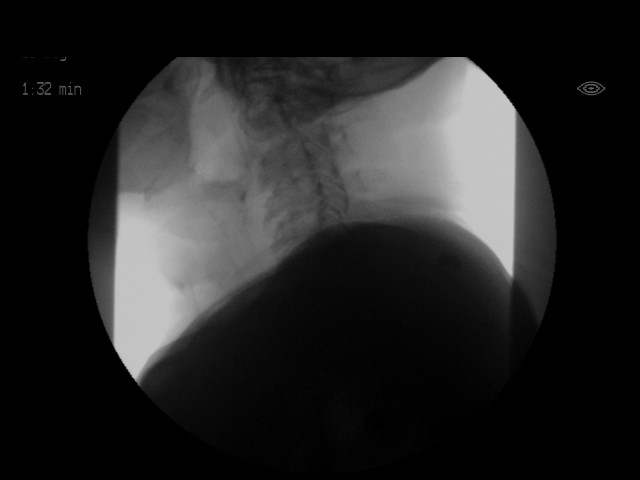
[im 13/16]
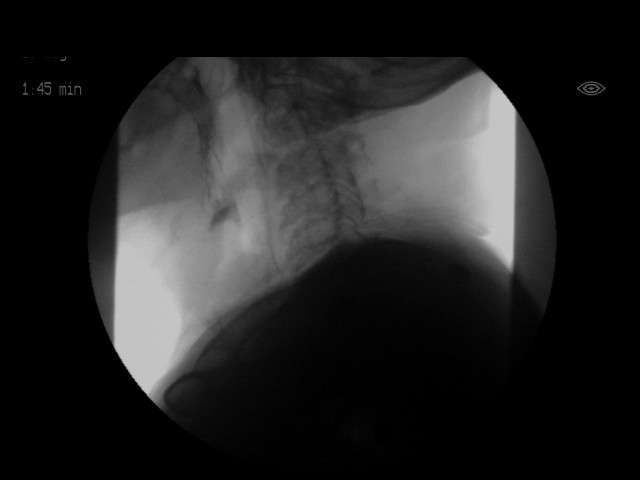
[im 14/16]
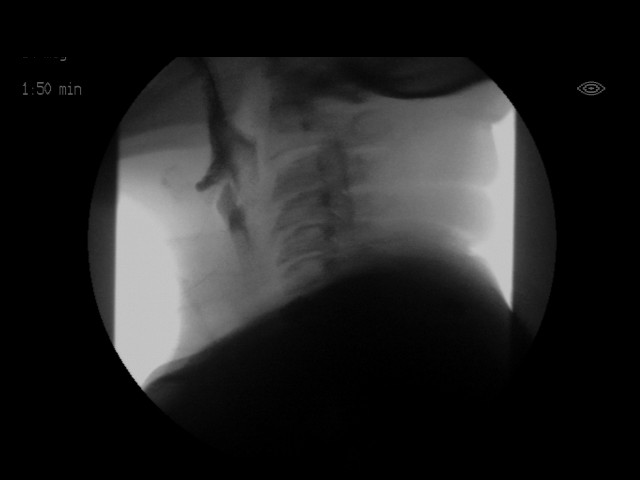
[im 14/16]
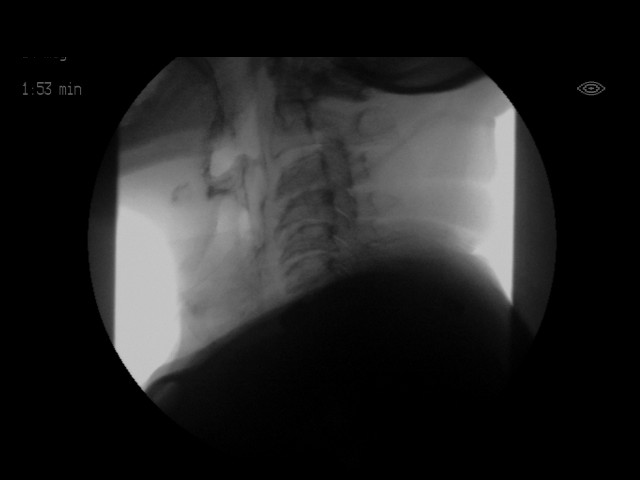
[im 16/16]
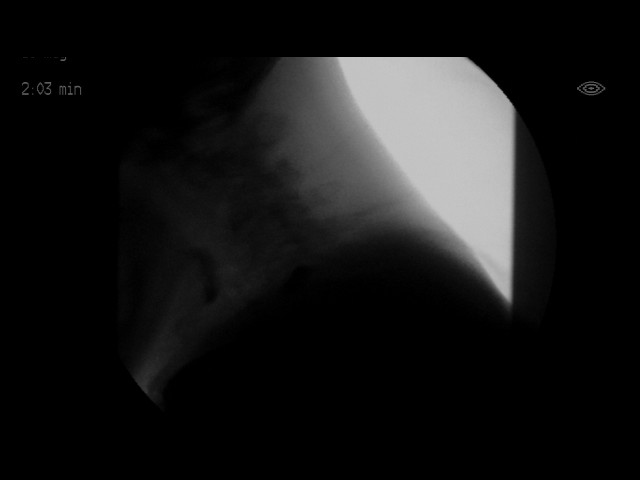
[im 16/16]
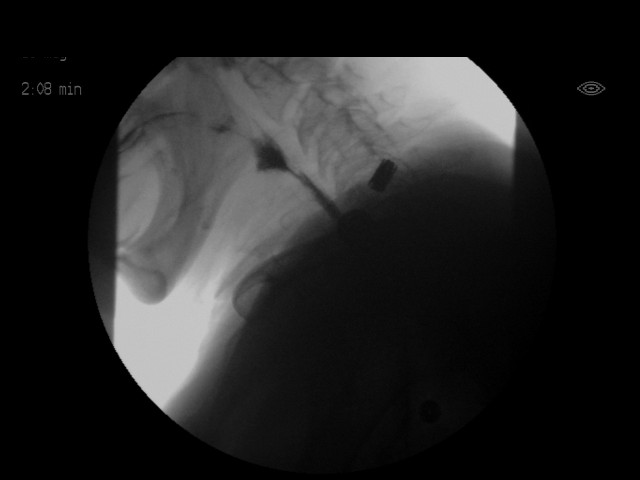

[18 of 24 positions shown; findings below may reference images not displayed]

FLUOROSCOPY FOR SWALLOWING FUNCTION STUDY:
Fluoroscopy was provided for swallowing function study, which was administered by a speech pathologist.  Final results and recommendations from this study are contained within the speech pathology report.

## 2018-11-03 ENCOUNTER — Other Ambulatory Visit: Payer: Self-pay

## 2018-11-03 MED ORDER — ACCU-CHEK GUIDE W/DEVICE KIT: 1.0000 | PACK | Freq: Four times a day (QID) | 0 refills | Status: DC

## 2018-11-03 MED ORDER — GLUCOSE BLOOD VI STRP: ORAL_STRIP | 12 refills | Status: DC

## 2018-11-03 NOTE — Telephone Encounter (Signed)
Device and test strips sent to pharmacy

## 2019-01-27 ENCOUNTER — Emergency Department (HOSPITAL_COMMUNITY): Payer: Medicare Other

## 2019-01-27 ENCOUNTER — Inpatient Hospital Stay (HOSPITAL_COMMUNITY)
Admission: EM | Admit: 2019-01-27 | Discharge: 2019-02-01 | DRG: 638 | Disposition: A | Discharge status: Skilled Nursing Facility | Payer: Medicare Other | Attending: Family Medicine | Admitting: Family Medicine

## 2019-01-27 ENCOUNTER — Other Ambulatory Visit: Payer: Self-pay

## 2019-01-27 ENCOUNTER — Encounter (HOSPITAL_COMMUNITY): Payer: Self-pay

## 2019-01-27 ENCOUNTER — Inpatient Hospital Stay (HOSPITAL_COMMUNITY): Payer: Medicare Other

## 2019-01-27 DIAGNOSIS — F418 Other specified anxiety disorders: Secondary | ICD-10-CM | POA: Diagnosis present

## 2019-01-27 DIAGNOSIS — Y939 Activity, unspecified: Secondary | ICD-10-CM

## 2019-01-27 DIAGNOSIS — Z7982 Long term (current) use of aspirin: Secondary | ICD-10-CM | POA: Diagnosis not present

## 2019-01-27 DIAGNOSIS — Y92019 Unspecified place in single-family (private) house as the place of occurrence of the external cause: Secondary | ICD-10-CM | POA: Diagnosis not present

## 2019-01-27 DIAGNOSIS — R0902 Hypoxemia: Secondary | ICD-10-CM | POA: Diagnosis not present

## 2019-01-27 DIAGNOSIS — R531 Weakness: Secondary | ICD-10-CM

## 2019-01-27 DIAGNOSIS — F17210 Nicotine dependence, cigarettes, uncomplicated: Secondary | ICD-10-CM | POA: Diagnosis present

## 2019-01-27 DIAGNOSIS — R279 Unspecified lack of coordination: Secondary | ICD-10-CM | POA: Diagnosis not present

## 2019-01-27 DIAGNOSIS — Z794 Long term (current) use of insulin: Secondary | ICD-10-CM | POA: Diagnosis not present

## 2019-01-27 DIAGNOSIS — R269 Unspecified abnormalities of gait and mobility: Secondary | ICD-10-CM | POA: Diagnosis not present

## 2019-01-27 DIAGNOSIS — E860 Dehydration: Secondary | ICD-10-CM | POA: Diagnosis present

## 2019-01-27 DIAGNOSIS — Z8249 Family history of ischemic heart disease and other diseases of the circulatory system: Secondary | ICD-10-CM

## 2019-01-27 DIAGNOSIS — Z86718 Personal history of other venous thrombosis and embolism: Secondary | ICD-10-CM

## 2019-01-27 DIAGNOSIS — R5381 Other malaise: Secondary | ICD-10-CM | POA: Diagnosis not present

## 2019-01-27 DIAGNOSIS — E875 Hyperkalemia: Secondary | ICD-10-CM | POA: Diagnosis present

## 2019-01-27 DIAGNOSIS — W19XXXA Unspecified fall, initial encounter: Secondary | ICD-10-CM | POA: Diagnosis not present

## 2019-01-27 DIAGNOSIS — E1151 Type 2 diabetes mellitus with diabetic peripheral angiopathy without gangrene: Secondary | ICD-10-CM | POA: Diagnosis present

## 2019-01-27 DIAGNOSIS — Z9114 Patient's other noncompliance with medication regimen: Secondary | ICD-10-CM | POA: Diagnosis not present

## 2019-01-27 DIAGNOSIS — I129 Hypertensive chronic kidney disease with stage 1 through stage 4 chronic kidney disease, or unspecified chronic kidney disease: Secondary | ICD-10-CM | POA: Diagnosis present

## 2019-01-27 DIAGNOSIS — Z9181 History of falling: Secondary | ICD-10-CM | POA: Diagnosis not present

## 2019-01-27 DIAGNOSIS — E1110 Type 2 diabetes mellitus with ketoacidosis without coma: Secondary | ICD-10-CM

## 2019-01-27 DIAGNOSIS — Z820 Family history of epilepsy and other diseases of the nervous system: Secondary | ICD-10-CM | POA: Diagnosis not present

## 2019-01-27 DIAGNOSIS — E1122 Type 2 diabetes mellitus with diabetic chronic kidney disease: Secondary | ICD-10-CM | POA: Diagnosis present

## 2019-01-27 DIAGNOSIS — R51 Headache: Secondary | ICD-10-CM | POA: Diagnosis not present

## 2019-01-27 DIAGNOSIS — Z8349 Family history of other endocrine, nutritional and metabolic diseases: Secondary | ICD-10-CM | POA: Diagnosis not present

## 2019-01-27 DIAGNOSIS — M6281 Muscle weakness (generalized): Secondary | ICD-10-CM | POA: Diagnosis not present

## 2019-01-27 DIAGNOSIS — R4587 Impulsiveness: Secondary | ICD-10-CM | POA: Diagnosis not present

## 2019-01-27 DIAGNOSIS — I444 Left anterior fascicular block: Secondary | ICD-10-CM | POA: Diagnosis present

## 2019-01-27 DIAGNOSIS — I69322 Dysarthria following cerebral infarction: Secondary | ICD-10-CM | POA: Diagnosis not present

## 2019-01-27 DIAGNOSIS — Z79899 Other long term (current) drug therapy: Secondary | ICD-10-CM | POA: Diagnosis not present

## 2019-01-27 DIAGNOSIS — F0390 Unspecified dementia without behavioral disturbance: Secondary | ICD-10-CM | POA: Diagnosis present

## 2019-01-27 DIAGNOSIS — Z96641 Presence of right artificial hip joint: Secondary | ICD-10-CM | POA: Diagnosis present

## 2019-01-27 DIAGNOSIS — W1830XA Fall on same level, unspecified, initial encounter: Secondary | ICD-10-CM | POA: Diagnosis present

## 2019-01-27 DIAGNOSIS — N183 Chronic kidney disease, stage 3 (moderate): Secondary | ICD-10-CM | POA: Diagnosis present

## 2019-01-27 DIAGNOSIS — R4781 Slurred speech: Secondary | ICD-10-CM | POA: Diagnosis not present

## 2019-01-27 DIAGNOSIS — I7389 Other specified peripheral vascular diseases: Secondary | ICD-10-CM | POA: Diagnosis not present

## 2019-01-27 DIAGNOSIS — E1165 Type 2 diabetes mellitus with hyperglycemia: Secondary | ICD-10-CM | POA: Diagnosis not present

## 2019-01-27 DIAGNOSIS — N179 Acute kidney failure, unspecified: Secondary | ICD-10-CM | POA: Diagnosis present

## 2019-01-27 DIAGNOSIS — E1100 Type 2 diabetes mellitus with hyperosmolarity without nonketotic hyperglycemic-hyperosmolar coma (NKHHC): Secondary | ICD-10-CM

## 2019-01-27 DIAGNOSIS — R2681 Unsteadiness on feet: Secondary | ICD-10-CM | POA: Diagnosis not present

## 2019-01-27 DIAGNOSIS — Z833 Family history of diabetes mellitus: Secondary | ICD-10-CM | POA: Diagnosis not present

## 2019-01-27 DIAGNOSIS — Z743 Need for continuous supervision: Secondary | ICD-10-CM | POA: Diagnosis not present

## 2019-01-27 DIAGNOSIS — R2689 Other abnormalities of gait and mobility: Secondary | ICD-10-CM | POA: Diagnosis not present

## 2019-01-27 DIAGNOSIS — R41841 Cognitive communication deficit: Secondary | ICD-10-CM | POA: Diagnosis not present

## 2019-01-27 LAB — CBG MONITORING, ED
Glucose-Capillary: >600 mg/dL (ref 70–99)
Glucose-Capillary: >600 mg/dL (ref 70–99)
Glucose-Capillary: >600 mg/dL (ref 70–99)
Glucose-Capillary: >600 mg/dL (ref 70–99)
Glucose-Capillary: 526 mg/dL (ref 70–99)
Glucose-Capillary: 320 mg/dL — ABNORMAL HIGH (ref 70–99)

## 2019-01-27 LAB — BASIC METABOLIC PANEL
Anion gap: 17 — ABNORMAL HIGH (ref 5–15)
Anion gap: 15 (ref 5–15)
BUN: 49 mg/dL — ABNORMAL HIGH (ref 8–23)
BUN: 41 mg/dL — ABNORMAL HIGH (ref 8–23)
CO2: 17 mmol/L — ABNORMAL LOW (ref 22–32)
CO2: 12 mmol/L — ABNORMAL LOW (ref 22–32)
Calcium: 9.3 mg/dL (ref 8.9–10.3)
Calcium: 9.4 mg/dL (ref 8.9–10.3)
Chloride: 84 mmol/L — ABNORMAL LOW (ref 98–111)
Chloride: 110 mmol/L (ref 98–111)
Creatinine, Ser: 2.53 mg/dL — ABNORMAL HIGH (ref 0.61–1.24)
Creatinine, Ser: 1.94 mg/dL — ABNORMAL HIGH (ref 0.61–1.24)
GFR calc Af Amer: 28 mL/min — ABNORMAL LOW (ref >60)
GFR calc Af Amer: 39 mL/min — ABNORMAL LOW (ref >60)
GFR calc non Af Amer: 24 mL/min — ABNORMAL LOW (ref >60)
GFR calc non Af Amer: 33 mL/min — ABNORMAL LOW (ref >60)
Glucose, Bld: 1191 mg/dL (ref 70–99)
Glucose, Bld: 291 mg/dL — ABNORMAL HIGH (ref 70–99)
Potassium: 6.3 mmol/L (ref 3.5–5.1)
Potassium: 6.2 mmol/L — ABNORMAL HIGH (ref 3.5–5.1)
Sodium: 118 mmol/L — CL (ref 135–145)
Sodium: 137 mmol/L (ref 135–145)

## 2019-01-27 LAB — POCT I-STAT EG7
Acid-base deficit: 5 mmol/L — ABNORMAL HIGH (ref 0.0–2.0)
Bicarbonate: 18.7 mmol/L — ABNORMAL LOW (ref 20.0–28.0)
Calcium, Ion: 0.98 mmol/L — ABNORMAL LOW (ref 1.15–1.40)
HCT: 39 % (ref 39.0–52.0)
Hemoglobin: 13.3 g/dL (ref 13.0–17.0)
O2 Saturation: 83 %
Potassium: 5.6 mmol/L — ABNORMAL HIGH (ref 3.5–5.1)
Sodium: 123 mmol/L — ABNORMAL LOW (ref 135–145)
TCO2: 20 mmol/L — ABNORMAL LOW (ref 22–32)
pCO2, Ven: 29.3 mmHg — ABNORMAL LOW (ref 44.0–60.0)
pH, Ven: 7.413 (ref 7.250–7.430)
pO2, Ven: 46 mmHg — ABNORMAL HIGH (ref 32.0–45.0)

## 2019-01-27 LAB — URINALYSIS, ROUTINE W REFLEX MICROSCOPIC
Bacteria, UA: NONE SEEN
Bilirubin Urine: NEGATIVE
Glucose, UA: >=500 mg/dL — AB
Ketones, ur: 5 mg/dL — AB
Leukocytes,Ua: NEGATIVE
Nitrite: NEGATIVE
Protein, ur: NEGATIVE mg/dL
Specific Gravity, Urine: 1.023 (ref 1.005–1.030)
pH: 6 (ref 5.0–8.0)

## 2019-01-27 LAB — CBC WITH DIFFERENTIAL/PLATELET
Abs Immature Granulocytes: 0.03 10*3/uL (ref 0.00–0.07)
Basophils Absolute: 0 10*3/uL (ref 0.0–0.1)
Basophils Relative: 1 %
Eosinophils Absolute: 0.1 10*3/uL (ref 0.0–0.5)
Eosinophils Relative: 1 %
HCT: 44.8 % (ref 39.0–52.0)
Hemoglobin: 12.6 g/dL — ABNORMAL LOW (ref 13.0–17.0)
Immature Granulocytes: 1 %
Lymphocytes Relative: 13 %
Lymphs Abs: 0.8 10*3/uL (ref 0.7–4.0)
MCH: 29 pg (ref 26.0–34.0)
MCHC: 28.1 g/dL — ABNORMAL LOW (ref 30.0–36.0)
MCV: 103 fL — ABNORMAL HIGH (ref 80.0–100.0)
Monocytes Absolute: 0.5 10*3/uL (ref 0.1–1.0)
Monocytes Relative: 8 %
Neutro Abs: 4.7 10*3/uL (ref 1.7–7.7)
Neutrophils Relative %: 76 %
Platelets: 218 10*3/uL (ref 150–400)
RBC: 4.35 MIL/uL (ref 4.22–5.81)
RDW: 13 % (ref 11.5–15.5)
WBC: 6.1 10*3/uL (ref 4.0–10.5)
nRBC: 0 % (ref 0.0–0.2)

## 2019-01-27 LAB — MAGNESIUM: Magnesium: 2.5 mg/dL — ABNORMAL HIGH (ref 1.7–2.4)

## 2019-01-27 LAB — LIPID PANEL
Cholesterol: 294 mg/dL — ABNORMAL HIGH (ref 0–200)
HDL: 66 mg/dL (ref >40)
LDL Cholesterol: 152 mg/dL — ABNORMAL HIGH (ref 0–99)
Total CHOL/HDL Ratio: 4.5 RATIO
Triglycerides: 380 mg/dL — ABNORMAL HIGH (ref <150)
VLDL: 76 mg/dL — ABNORMAL HIGH (ref 0–40)

## 2019-01-27 LAB — GLUCOSE, CAPILLARY
Glucose-Capillary: 111 mg/dL — ABNORMAL HIGH (ref 70–99)
Glucose-Capillary: 114 mg/dL — ABNORMAL HIGH (ref 70–99)
Glucose-Capillary: 124 mg/dL — ABNORMAL HIGH (ref 70–99)
Glucose-Capillary: 132 mg/dL — ABNORMAL HIGH (ref 70–99)
Glucose-Capillary: 187 mg/dL — ABNORMAL HIGH (ref 70–99)

## 2019-01-27 LAB — TROPONIN I: Troponin I: <0.03 ng/mL (ref <0.03)

## 2019-01-27 LAB — ETHANOL: Alcohol, Ethyl (B): <10 mg/dL (ref <10)

## 2019-01-27 MED ORDER — SODIUM CHLORIDE 0.9 % IV BOLUS
1000.0000 mL | Freq: Once | INTRAVENOUS | Status: AC
  Administered 2019-01-27: 1000 mL via INTRAVENOUS

## 2019-01-27 MED ORDER — SODIUM CHLORIDE 0.9 % IV SOLN: INTRAVENOUS | Status: DC

## 2019-01-27 MED ORDER — ACETAMINOPHEN 650 MG RE SUPP: 650.0000 mg | Freq: Four times a day (QID) | RECTAL | Status: DC | PRN

## 2019-01-27 MED ORDER — ONDANSETRON HCL 4 MG/2ML IJ SOLN: 4.0000 mg | Freq: Four times a day (QID) | INTRAMUSCULAR | Status: DC | PRN

## 2019-01-27 MED ORDER — ENOXAPARIN SODIUM 30 MG/0.3ML ~~LOC~~ SOLN
30.0000 mg | SUBCUTANEOUS | Status: DC
  Administered 2019-01-27: 30 mg via SUBCUTANEOUS
  Filled 2019-01-27 (×2): qty 0.3

## 2019-01-27 MED ORDER — DEXTROSE-NACL 5-0.45 % IV SOLN
INTRAVENOUS | Status: DC
  Administered 2019-01-27: 20:00:00 via INTRAVENOUS

## 2019-01-27 MED ORDER — INSULIN GLARGINE 100 UNIT/ML ~~LOC~~ SOLN
14.0000 [IU] | Freq: Every day | SUBCUTANEOUS | Status: DC
  Administered 2019-01-27: 14 [IU] via SUBCUTANEOUS
  Filled 2019-01-27 (×2): qty 0.14

## 2019-01-27 MED ORDER — SODIUM CHLORIDE 0.9 % IV SOLN
INTRAVENOUS | Status: DC
  Administered 2019-01-27: 16:00:00 via INTRAVENOUS

## 2019-01-27 MED ORDER — ENOXAPARIN SODIUM 30 MG/0.3ML ~~LOC~~ SOLN: 30.0000 mg | SUBCUTANEOUS | Status: DC

## 2019-01-27 MED ORDER — INSULIN REGULAR(HUMAN) IN NACL 100-0.9 UT/100ML-% IV SOLN
INTRAVENOUS | Status: DC
  Administered 2019-01-27: 5.4 [IU]/h via INTRAVENOUS
  Filled 2019-01-27: qty 100

## 2019-01-27 MED ORDER — DEXTROSE-NACL 5-0.45 % IV SOLN: INTRAVENOUS | Status: DC

## 2019-01-27 MED ORDER — ONDANSETRON HCL 4 MG PO TABS: 4.0000 mg | ORAL_TABLET | Freq: Four times a day (QID) | ORAL | Status: DC | PRN

## 2019-01-27 MED ORDER — ACETAMINOPHEN 325 MG PO TABS: 650.0000 mg | ORAL_TABLET | Freq: Four times a day (QID) | ORAL | Status: DC | PRN

## 2019-01-27 MED ORDER — INSULIN REGULAR(HUMAN) IN NACL 100-0.9 UT/100ML-% IV SOLN: INTRAVENOUS | Status: DC

## 2019-01-27 MED ORDER — INSULIN ASPART 100 UNIT/ML ~~LOC~~ SOLN
0.0000 [IU] | Freq: Three times a day (TID) | SUBCUTANEOUS | Status: DC
  Administered 2019-01-28: 17:00:00 3 [IU] via SUBCUTANEOUS
  Administered 2019-01-28 (×2): 2 [IU] via SUBCUTANEOUS
  Administered 2019-01-29: 5 [IU] via SUBCUTANEOUS
  Administered 2019-01-29: 7 [IU] via SUBCUTANEOUS
  Administered 2019-01-30: 5 [IU] via SUBCUTANEOUS
  Administered 2019-01-30 – 2019-02-01 (×6): 9 [IU] via SUBCUTANEOUS

## 2019-01-27 MED ORDER — SODIUM CHLORIDE 0.9 % IV SOLN
Freq: Once | INTRAVENOUS | Status: AC
  Administered 2019-01-27: 17:00:00 via INTRAVENOUS

## 2019-01-27 NOTE — ED Notes (Signed)
CBG 320 RN Cindy notified.

## 2019-01-27 NOTE — ED Notes (Signed)
Please call sister with updates  Janson Lamar [phone]

## 2019-01-27 NOTE — H&P (Signed)
[facility] Admission History and Physical Service Pager: [phone]  Patient name: [name] [name] Medical record number: [id number] Date of birth: Jan 25, 1946 Age: 73 y.o. Gender: male  Primary Care Provider: Azzie Glatter, [name] Consultants: None Code Status: FULL Preferred Emergency Contact: Sister [name] [phone]  Chief Complaint: " I fell and hit my head. I also know my sugars have been high".  Assessment and Plan: Michai Dieppa is a 73 y.o. male presenting with a fall and found to have HHS. PMH is significant for T2DM, HTN, history of stroke.  HHS Patient presented with glucose of 1,197. He is mentating at baseline and is oriented to person, place, and time. He had a fall and was found by his sister today. The most likely etiology of his HHS is medication non-compliance. He lives alone and has since December of 2019 as he previously lived with his daughter who passed away at that time. His sister comes to check on him periodically and "helps me with my meds". Initially, concern for another stroke. However, head CT was negative for any acute infarct, mass, or hemorrhage with no focal deficits on exam. He has an unchanged dysarthria from a previous stroke. We did consider possible infectious etiology as the inciting event to his HHS however he is asymptomatic with no chest pain, dyspnea, abdominal pain, nausea, vomiting, and hemodynamically stable. We will obtain CXR and one time troponin to rule out any cardiopulmonary causes such as acute MI or CAP. Encouraged that his EKG on admission is unchanged from previous. Of note, corrected sodium of 135; 118 on BMP. Potassium of 6.3. Potassium is likely falsely elevated due to lack of insulin to aid in cellular transport. He has received 2L of NS bolus in ED. Anion Gap of 17 on admission. - Admit to Progressive, attending Dr. Andria Frames - Vitals per floor - 2L in ED, additional 1L NS bolus - NS @ 1 1/2 maintenance rate while  CBG > 250 - D5 1/2 NS @ maintenance when CBG < 250 - Insulin gtt - BMP q4 hours while on insulin drip - When anion gap is closed x 2; transition to SubQ insulin - If K+ drops below 4.5 while on insulin add K+ to fluids - PT/OT eval and treat  T2DM. Chronic, poorly controlled. A1c pending on this admission. Patient will need DM education and help with administering meds at home. - see problem above for acute inpatient regimen; will transition to subQ insulin after anion gap has closed x 2 - DM educator consult  AKI with CKD III. Acute on Chronic. Baseline Scr appears to be in the 1.48-1.87 range. 2.53 on admission. Most likely prerenal from dehydration 2/2 to HHS. BUN/Creatnine ratio of almost 20. - Aggressive hydration with IVFs (see problem above) - Daily BMP  HTN. Chronic, stable. Mostly normotensive since admission with SBP range of 105-152 and DBP range of 65-88. - Holding home amlodipine while NPO.  Hx of Stroke. Chronic. Stable. Neuro exam with no focal deficits and no strength or sensory deficits. Unchanged dysarthria from baseline. - Cont to monitor for signs of acute stroke. - Vitals per floor  FEN/GI: NPO while on insulin gtt Prophylaxis: Lovenox  Disposition: inpatient  History of Present Illness:  [name] [name] is a 73 y.o. male presenting with an acute fall and impact to his head this morning. Patient remembers the fall and denies any LOC. He also states he knows his "sugars have been have over the past 4 days". Patient endorses  his home glucometer has been unable to read his blood glucose b/c it has been so elevated. He is unsure why it has become so elevated and he states he takes his home insulin. He says he thinks he is on 6U each morning and he has not missed any doses. He has no other complaints and states he has no weakness, headache, vision changes, chest pain, dyspnea, cough, abdominal pain, nausea, vomiting, urinary pain, or urinary frequency/urgency.  I am  suspicious patient is unable to manage his home insulin regimen effectively.  Review Of Systems: Per HPI with the following additions:  Review of Systems  Constitutional: Negative for chills, fever and malaise/fatigue.  HENT: Negative for congestion, sinus pain and sore throat.   Respiratory: Negative for cough, sputum production, shortness of breath and wheezing.   Cardiovascular: Negative for chest pain, palpitations and leg swelling.  Gastrointestinal: Negative for abdominal pain, constipation, diarrhea, nausea and vomiting.  Genitourinary: Negative for dysuria, frequency and urgency.  Neurological: Negative for dizziness, sensory change, speech change, focal weakness, loss of consciousness and headaches.    Patient Active Problem List   Diagnosis Date Noted  . Fall 10/03/2018  . Uncontrolled type 2 diabetes mellitus with hyperglycemia, with long-term current use of insulin ([place]) 10/03/2018  . Chest wall contusion 10/03/2018  . Underweight 11/18/2017  . Normocytic anemia 11/18/2017  . DKA, type 2 ([place]) 08/01/2017  . History of cerebrovascular accident (CVA) in adulthood 07/25/2017  . HTN (hypertension) 07/25/2017  . Depression with anxiety 07/25/2017  . Left ventricular diastolic dysfunction 56/31/4970  . Dementia ([place]) 07/25/2017  . 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 Diabetes mellitus, insulin dependent (IDDM), controlled ([place]) 06/27/2017  . Hypertension 06/27/2017  . Hyperkalemia 01/16/2017  . Acute renal failure superimposed on stage 3 chronic kidney disease ([place])   . Noncompliance with medication regimen 08/17/2016  . History of DVT (deep vein thrombosis) 07/06/2016  . Vitamin B 12 deficiency 06/18/2016  . Hyperglycemia 05/15/2016  . Hyponatremia 05/15/2016  . Anxiety 05/15/2016  . Narcotic dependency, continuous ([place]) 11/02/2015  . Uncontrolled type 2 diabetes mellitus with diabetic nephropathy, with long-term current use of insulin ([place]) 08/12/2015  .  Depression 08/12/2015  . CKD (chronic kidney disease) stage 3, GFR 30-59 ml/min ([name]) 03/20/2014    Past Medical History: Past Medical History:  Diagnosis Date  . 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 Closed comminuted intertrochanteric fracture of left femur ([place])   . Diabetes mellitus without complication ([place])   . Diabetic hyperosmolar non-ketotic state ([place]) 05/15/2016  . DKA (diabetic ketoacidoses) ([place]) 05/09/2016  . Hypertension   . Postoperative anemia due to acute blood loss 06/18/2016  . Retroperitoneal hematoma 06/18/2016  . Stroke ([place])   . Vitamin B 12 deficiency 06/18/2016    Past Surgical History: Past Surgical History:  Procedure Laterality Date  . HIP ARTHROPLASTY Right 02/05/2013   Procedure: ARTHROPLASTY BIPOLAR HIP;  Surgeon: [name], MD;  Location: [facility];  Service: Orthopedics;  Laterality: Right;  . INTRAMEDULLARY (IM) NAIL INTERTROCHANTERIC Left 06/16/2016   Procedure: INTRAMEDULLARY (IM) NAIL INTERTROCHANTRIC;  Surgeon: Rod Can, MD;  Location: [facility];  Service: Orthopedics;  Laterality: Left;    Social History: Social History   Tobacco Use  . Smoking status: Current Some Day Smoker    Packs/day: 0.25    Years: 30.00    Pack years: 7.50    Types: Cigarettes    Last attempt to quit: 01/31/2013  Years since quitting: 5.9  . Smokeless tobacco: Never Used  . Tobacco comment: "some days I light one", requests a nicotine patch  Substance Use Topics  . Alcohol use: No  . Drug use: No   Additional social history: none Please also refer to relevant sections of EMR.  Family History: Family History  Problem Relation Age of Onset  . Diabetes Mother   . Alzheimer's disease Mother   . Hypertension Mother   . Hyperlipidemia Father   . Hypertension Father   . Healthy Maternal Grandmother   . Pneumonia Maternal Grandfather     Allergies and Medications: No Known Allergies No current  facility-administered medications on file prior to encounter.    Current Outpatient Medications on File Prior to Encounter  Medication Sig Dispense Refill  . amLODipine (NORVASC) 10 MG tablet Take 1 tablet (10 mg total) by mouth daily. 30 tablet 11  . aspirin 81 MG EC tablet Take 1 tablet (81 mg total) by mouth daily. 90 tablet 3  . Blood Glucose Monitoring Suppl (ACCU-CHEK GUIDE) w/Device KIT 1 Device by Does not apply route QID. 1 kit 0  . glucose blood (ACCU-CHEK GUIDE) test strip Use as instructed 100 each 12  . glucose blood (TRUE METRIX BLOOD GLUCOSE TEST) test strip CHECK BLOOD SUGAR UP TO 4 TIMES A DAY. 300 each 0  . insulin aspart (NOVOLOG) 100 UNIT/ML FlexPen Inject 3 Units into the skin 3 (three) times daily with meals. For blood sugars greater than 150 only (Patient not taking: Reported on 08/17/2018) 15 mL 1  . insulin aspart protamine- aspart (NOVOLOG MIX 70/30) (70-30) 100 UNIT/ML injection Inject 0.12 mLs (12 Units total) into the skin 2 (two) times daily with a meal. 10 mL 1    Objective: BP 139/73   Pulse 61   Temp 98.2 F (36.8 C)   Resp 19   Ht '5\' 10"'  (1.778 m)   Wt 66.2 kg   SpO2 100%   BMI 20.94 kg/m  Exam: Gen: Alert and Oriented x 3, NAD HEENT: Normocephalic, atraumatic, PERRLA, EOMI,  non-swollen, non-erythematous pharyngeal mucosa, MMM, no exudates Neck: trachea midline, no LAD, no JVD CV: RRR, no murmurs, normal S1, S2 split Resp: CTAB, no wheezing, rales, or rhonchi, comfortable work of breathing Abd: non-distended, non-tender, soft, +bs in all four quadrants MSK: bilateral thumb hypothenar deviation Ext: no clubbing, cyanosis, or edema Neuro: CN II-XII intact, No gross deficits Skin: warm, dry, intact, no rashes  Labs and Imaging: CBC BMET  Recent Labs  Lab 01/27/19 1149 01/27/19 1412  WBC 6.1  --   HGB 12.6* 13.3  HCT 44.8 39.0  PLT 218  --    Recent Labs  Lab 01/27/19 1149 01/27/19 1412  NA 118* 123*  K 6.3* 5.6*  CL 84*  --   CO2 17*   --   BUN 49*  --   CREATININE 2.53*  --   GLUCOSE 1,191*  --   CALCIUM 9.3  --      U/A: >500 glucose, 5 ketones, negative for leuks or bacteria UCx: pending Ethanol: <10 Mag: 2.5 Hgb A1c: pending Troponin: <0.03 CXR: No acute cardiopulmonary disease EKG: NSR, LAFB, old anteroseptal infarct  [place], DO 01/27/2019, 2:51 PM PGY-2, [place] Intern pager: [phone], text pages welcome

## 2019-01-27 NOTE — ED Provider Notes (Signed)
Medical screening examination/treatment/procedure(s) were conducted as a shared visit with non-physician practitioner(s) and myself.  I personally evaluated the patient during the encounter.  EKG Interpretation  Date/Time:  Thursday January 27 2019 11:29:05 EDT Ventricular Rate:  67 PR Interval:    QRS Duration: 105 QT Interval:  405 QTC Calculation: 428 R Axis:   -71 Text Interpretation:  Sinus rhythm Left anterior fascicular block Anteroseptal infarct, old No significant change since last tracing Confirmed by Fredia Sorrow [phone]) on 01/27/2019 11:47:04 AM Also confirmed by Fredia Sorrow [phone]), editor Hattie Perch ([id number])  on 01/27/2019 12:39:03 PM   Patient sent in by family members for high blood sugars.  Patient is alert but does not seem to understand why the sugars would be high.  Significant lab abnormalities here with a blood sugar of 1191.  Potassium appropriately acidotic so consistent with DKA.  Sodium low due to pseudohyponatremia due to the high blood sugars.  Patient will need to be started on glucose stabilizer and will need to be admitted.  No leukocytosis.  Hemoglobin slightly down at 12.  Patient's exam is stated alert the lungs clear abdomen soft nontender.  CRITICAL CARE Performed by: Fredia Sorrow Total critical care time: 30 minutes Critical care time was exclusive of separately billable procedures and treating other patients. Critical care was necessary to treat or prevent imminent or life-threatening deterioration. Critical care was time spent personally by me on the following activities: development of treatment plan with patient and/or surrogate as well as nursing, discussions with consultants, evaluation of patient's response to treatment, examination of patient, obtaining history from patient or surrogate, ordering and performing treatments and interventions, ordering and review of laboratory studies, ordering and review of radiographic studies, pulse  oximetry and re-evaluation of patient's condition.    Fredia Sorrow, MD 01/27/19 1257

## 2019-01-27 NOTE — ED Triage Notes (Addendum)
Per [facility]: Pt has been having "HIGH" CBG readings since Monday. Family was able to bring CBG down to 350 on Monday but that it went back up and has not been able to come back down since then. Pt has had slurred speech since Monday that may or may not be worse. Last seen normal was last night around 6:30 pm. Family checked on pt this morning and noted that the pts right arm had a tremor. Pt is alert and oriented X 4. No complaints of pain. Temp normal with EMS. CBG read "HIGH" at home and with EMS. Pt is strong bilaterally and neurologically intact.

## 2019-01-27 NOTE — ED Provider Notes (Signed)
[facility] EMERGENCY DEPARTMENT Provider Note   CSN: [id number] Arrival date & time: 01/27/19  1110    History   Chief Complaint Chief Complaint  Patient presents with   Hyperglycemia    HPI Earland Reish is a 73 y.o. male with history of insulin-dependent diabetes mellitus, CVA, hypertension, CKD presenting brought in for acute onset, progressively worsening hyperglycemia for 4 days.  Per EMS report, patient's blood sugars began running 350 on Monday and since then have been reading "high" on his glucometer.  The patient sisters come to visit him frequently to check up on him and this morning at around 8:30 AM his sister noted a tremor to the right upper extremity which was not present last night when she left him at around 6 PM.  The patient currently denies any symptoms including nausea, vomiting, abdominal pain, chest pain, shortness of breath, urinary symptoms, fever, cough, headache, or weakness, or numbness.  He does report decreased oral intake for the last 6 months or so but denies any weight loss.  He exhibits some dysarthric speech which per EMS is baseline when his sugars are this elevated.  He said he last had his insulin sometime last night but is unsure how much he had.     The history is provided by the patient and the EMS personnel.    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 ([place]) 06/08/2015   Closed comminuted intertrochanteric fracture of left femur ([name])    Diabetes mellitus without complication ([place])    Diabetic hyperosmolar non-ketotic state ([place]) 05/15/2016   DKA (diabetic ketoacidoses) ([place]) 05/09/2016   Hypertension    Postoperative anemia due to acute blood loss 06/18/2016   Retroperitoneal hematoma 06/18/2016   Stroke ([place])    Vitamin B 12 deficiency 06/18/2016    Patient Active Problem List   Diagnosis Date Noted   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/27/2019   Fall 10/03/2018   Uncontrolled type 2 diabetes mellitus with hyperglycemia, with long-term current use of insulin ([place]) 10/03/2018   Chest wall contusion 10/03/2018   Underweight 11/18/2017   Normocytic anemia 11/18/2017   DKA, type 2 ([place]) 08/01/2017   History of cerebrovascular accident (CVA) in adulthood 07/25/2017   HTN (hypertension) 07/25/2017   Depression with anxiety 07/25/2017   Left ventricular diastolic dysfunction 59/93/5701   Dementia ([place]) 07/25/2017   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   Diabetes mellitus, insulin dependent (IDDM), controlled ([name]) 06/27/2017   Hypertension 06/27/2017   Hyperkalemia 01/16/2017   Acute renal failure superimposed on stage 3 chronic kidney disease ([place])    Noncompliance with medication regimen 08/17/2016   History of DVT (deep vein thrombosis) 07/06/2016   Vitamin B 12 deficiency 06/18/2016   Hyperglycemia 05/15/2016   Hyponatremia 05/15/2016   Anxiety 05/15/2016   Narcotic dependency, continuous ([place]) 11/02/2015   Uncontrolled type 2 diabetes mellitus with diabetic nephropathy, with long-term current use of insulin ([place]) 08/12/2015   Depression 08/12/2015   CKD (chronic kidney disease) stage 3, GFR 30-59 ml/min ([place]) 03/20/2014    Past Surgical History:  Procedure Laterality Date   HIP ARTHROPLASTY Right 02/05/2013   Procedure: ARTHROPLASTY BIPOLAR HIP;  Surgeon: [name], MD;  Location: [facility];  Service: Orthopedics;  Laterality: Right;   INTRAMEDULLARY (IM) NAIL INTERTROCHANTERIC Left 06/16/2016   Procedure: INTRAMEDULLARY (IM) NAIL INTERTROCHANTRIC;  Surgeon: Rod Can, MD;  Location: [facility];  Service: Orthopedics;  Laterality: Left;  Home Medications    Prior to Admission medications   Medication Sig Start Date End Date Taking? Authorizing Provider  amLODipine (NORVASC) 10 MG tablet Take 1 tablet (10 mg total) by mouth daily. 05/25/18   Azzie Glatter, [name]    aspirin 81 MG EC tablet Take 1 tablet (81 mg total) by mouth daily. 05/25/18   Azzie Glatter, [name]  Blood Glucose Monitoring Suppl (ACCU-CHEK GUIDE) w/Device KIT 1 Device by Does not apply route QID. 11/03/18   Azzie Glatter, [name]  glucose blood (ACCU-CHEK GUIDE) test strip Use as instructed 11/03/18   Azzie Glatter, [name]  glucose blood (TRUE METRIX BLOOD GLUCOSE TEST) test strip CHECK BLOOD SUGAR UP TO 4 TIMES A DAY. 05/25/18   Azzie Glatter, [name]  insulin aspart (NOVOLOG) 100 UNIT/ML FlexPen Inject 3 Units into the skin 3 (three) times daily with meals. For blood sugars greater than 150 only 05/25/18   Azzie Glatter, [name]  insulin aspart protamine- aspart (NOVOLOG MIX 70/30) (70-30) 100 UNIT/ML injection Inject 0.12 mLs (12 Units total) into the skin 2 (two) times daily with a meal. 10/04/18   Georgette Shell, MD    Family History Family History  Problem Relation Age of Onset   Diabetes Mother    Alzheimer's disease Mother    Hypertension Mother    Hyperlipidemia Father    Hypertension Father    Healthy Maternal Grandmother    Pneumonia Maternal Grandfather     Social History Social History   Tobacco Use   Smoking status: Current Some Day Smoker    Packs/day: 0.25    Years: 30.00    Pack years: 7.50    Types: Cigarettes    Last attempt to quit: 01/31/2013    Years since quitting: 5.9   Smokeless tobacco: Never Used   Tobacco comment: "some days I light one", requests a nicotine patch  Substance Use Topics   Alcohol use: No   Drug use: No     Allergies   Patient has no known allergies.   Review of Systems Review of Systems  Constitutional: Negative for chills and fever.  Respiratory: Negative for cough and shortness of breath.   Cardiovascular: Negative for chest pain.  Gastrointestinal: Negative for abdominal pain, nausea and vomiting.  Endocrine: Negative for polyuria.  Genitourinary: Negative for dysuria, frequency and urgency.   Neurological: Positive for tremors. Negative for syncope, numbness and headaches.  All other systems reviewed and are negative.    Physical Exam Updated Vital Signs BP 116/80    Pulse (!) 59    Temp 98.2 F (36.8 C)    Resp (!) 21    Ht _0  (1.778 m)    Wt 66.2 kg    SpO2 98%    BMI 20.94 kg/m   Physical Exam Vitals signs and nursing note reviewed.  Constitutional:      General: He is not in acute distress.    Appearance: He is well-developed.  HENT:     Head: Normocephalic and atraumatic.     Mouth/Throat:     Mouth: Mucous membranes are dry.  Eyes:     General:        Right eye: No discharge.        Left eye: No discharge.     Extraocular Movements: Extraocular movements intact.     Conjunctiva/sclera: Conjunctivae normal.     Pupils: Pupils are equal, round, and reactive to light.  Neck:     Musculoskeletal:  Normal range of motion and neck supple.     Vascular: No JVD.     Trachea: No tracheal deviation.  Cardiovascular:     Rate and Rhythm: Normal rate and regular rhythm.     Pulses: Normal pulses.     Heart sounds: Normal heart sounds.  Pulmonary:     Effort: Pulmonary effort is normal.     Breath sounds: Normal breath sounds.  Abdominal:     General: A surgical scar is present. Bowel sounds are normal. There is no distension.     Palpations: Abdomen is soft.     Tenderness: There is no abdominal tenderness. There is no guarding or rebound.  Skin:    General: Skin is warm and dry.     Findings: No erythema.  Neurological:     Mental Status: He is alert and oriented to person, place, and time.     Cranial Nerves: No cranial nerve deficit.     Sensory: No sensory deficit.     Comments: Mildly dysarthric speech.  Cranial nerves II through XII tested and intact.  Patient oriented to person place and time.  He exhibits some tremulousness of the right upper extremity with elevation of the arm against gravity.  No pronator drift.  5/5 strength of BUE and BLE major  muscle groups.  Sensation intact to soft touch of upper and lower extremities.  Psychiatric:        Behavior: Behavior normal.      ED Treatments / Results  Labs (all labs ordered are listed, but only abnormal results are displayed) Labs Reviewed  BASIC METABOLIC PANEL - Abnormal; Notable for the following components:      Result Value   Sodium 118 (*)    Potassium 6.3 (*)    Chloride 84 (*)    CO2 17 (*)    Glucose, Bld 1,191 (*)    BUN 49 (*)    Creatinine, Ser 2.53 (*)    GFR calc non Af Amer 24 (*)    GFR calc Af Amer 28 (*)    Anion gap 17 (*)    All other components within normal limits  CBC WITH DIFFERENTIAL/PLATELET - Abnormal; Notable for the following components:   Hemoglobin 12.6 (*)    MCV 103.0 (*)    MCHC 28.1 (*)    All other components within normal limits  URINALYSIS, ROUTINE W REFLEX MICROSCOPIC - Abnormal; Notable for the following components:   Color, Urine COLORLESS (*)    Glucose, UA >=500 (*)    Hgb urine dipstick SMALL (*)    Ketones, ur 5 (*)    All other components within normal limits  CBG MONITORING, ED - Abnormal; Notable for the following components:   Glucose-Capillary >600 (*)    All other components within normal limits  CBG MONITORING, ED - Abnormal; Notable for the following components:   Glucose-Capillary >600 (*)    All other components within normal limits  POCT I-STAT EG7 - Abnormal; Notable for the following components:   pCO2, Ven 29.3 (*)    pO2, Ven 46.0 (*)    Bicarbonate 18.7 (*)    TCO2 20 (*)    Acid-base deficit 5.0 (*)    Sodium 123 (*)    Potassium 5.6 (*)    Calcium, Ion 0.98 (*)    All other components within normal limits  CBG MONITORING, ED - Abnormal; Notable for the following components:   Glucose-Capillary >600 (*)    All other components  within normal limits  CBG MONITORING, ED - Abnormal; Notable for the following components:   Glucose-Capillary >600 (*)    All other components within normal limits    URINE CULTURE  ETHANOL  BASIC METABOLIC PANEL  BASIC METABOLIC PANEL  BASIC METABOLIC PANEL  MAGNESIUM  HEMOGLOBIN A1C  TROPONIN I  LIPID PANEL    EKG EKG Interpretation  Date/Time:  Thursday January 27 2019 11:29:05 EDT Ventricular Rate:  67 PR Interval:    QRS Duration: 105 QT Interval:  405 QTC Calculation: 428 R Axis:   -71 Text Interpretation:  Sinus rhythm Left anterior fascicular block Anteroseptal infarct, old No significant change since last tracing Confirmed by Fredia Sorrow [phone]) on 01/27/2019 11:47:04 AM Also confirmed by Fredia Sorrow [phone]), editor Hattie Perch ([id number])  on 01/27/2019 12:39:03 PM   Radiology Ct Head Wo Contrast  Result Date: 01/27/2019 CLINICAL DATA:  Pain following fall EXAM: CT HEAD WITHOUT CONTRAST TECHNIQUE: Contiguous axial images were obtained from the base of the skull through the vertex without intravenous contrast. COMPARISON:  October 02, 2018 FINDINGS: Brain: Mild to moderate diffuse atrophy is stable. There is no intracranial mass, hemorrhage, extra-axial fluid collection, or midline shift. There is small vessel disease in the centra semiovale bilaterally, stable. No acute infarct evident. Vascular: No hyperdense vessel. There is calcification in each distal vertebral artery and carotid siphon regions bilaterally. Skull: The bony calvarium appears intact. Sinuses/Orbits: There is an air-fluid level in the left maxillary antrum. There is opacification throughout much of the right sphenoid sinus. Orbits appear symmetric bilaterally. Other: Mastoid air cells are clear. There is debris in each external auditory canal. IMPRESSION: Atrophy with patchy periventricular small vessel disease. No acute infarct. No mass or hemorrhage. There are foci of arterial vascular calcification. Foci of paranasal sinus disease noted. There is probable cerumen in each external auditory canal. Electronically Signed   By: Lowella Grip III M.D.   On:  01/27/2019 12:56    Procedures .Critical Care Performed by: Renita Papa, PA-C Authorized by: Renita Papa, PA-C   Critical care provider statement:    Critical care time (minutes):  40   Critical care was necessary to treat or prevent imminent or life-threatening deterioration of the following conditions:  Metabolic crisis   Critical care was time spent personally by me on the following activities:  Discussions with consultants, evaluation of patient's response to treatment, examination of patient, ordering and performing treatments and interventions, ordering and review of laboratory studies, ordering and review of radiographic studies, pulse oximetry, re-evaluation of patient's condition, obtaining history from patient or surrogate and review of old charts   I assumed direction of critical care for this patient from another provider in my specialty: no     (including critical care time)  Medications Ordered in ED Medications  0.9 %  sodium chloride infusion ( Intravenous New Bag/Given 01/27/19 1610)  dextrose 5 %-0.45 % sodium chloride infusion (has no administration in time range)  insulin regular, human (MYXREDLIN) 100 units/ 100 mL infusion (16.2 Units/hr Intravenous Restarted 01/27/19 1608)  enoxaparin (LOVENOX) injection 30 mg (has no administration in time range)  0.9 %  sodium chloride infusion (has no administration in time range)  sodium chloride 0.9 % bolus 1,000 mL (1,000 mLs Intravenous New Bag/Given 01/27/19 1149)  sodium chloride 0.9 % bolus 1,000 mL (0 mLs Intravenous Stopped 01/27/19 1543)     Initial Impression / Assessment and Plan / ED Course  I have reviewed the triage vital  signs and the nursing notes.  Pertinent labs & imaging results that were available during my care of the patient were reviewed by me and considered in my medical decision making (see chart for details).        Patient presenting in DKA.  He is afebrile, vital signs are stable.  Nontoxic  in appearance.  Mild dysarthria and tremulousness of the right upper extremity but no focal neurologic deficits.  Head CT shows atrophy with patchy periventricular small vessel disease. No acute infarct.  Lab work significant for severe hyperglycemia, corrected sodium of 135, hyperkalemia though EKG shows mildly peaked T waves, QTC within normal limits.  Elevated anion gap of 17, low bicarb of 17.  VBG shows pH of 7.4.  No leukocytosis, mild anemia.  He was given IV fluids, started on insulin drip.  Will require admission for DKA.  Spoke with Dr. Garlan Fillers with family medicine who agrees to assume care of patient and bring him into the hospital for further evaluation and management.  Final Clinical Impressions(s) / ED Diagnoses   Final diagnoses:  Diabetic ketoacidosis without coma associated with type 2 diabetes mellitus ([place])  HHS (hypothenar hammer syndrome) [facility])    ED Discharge Orders    None       Renita Papa, PA-C 01/27/19 1631    Fredia Sorrow, MD 01/30/19 [phone]

## 2019-01-27 NOTE — ED Notes (Signed)
Patient transported to X-ray 

## 2019-01-27 NOTE — Progress Notes (Signed)
I saw and examined [name].  I discussed with Dr. Garlan Fillers and we have agreed on a management plan.  I will cosign the H&PE when available.  Briefly, 73 yo male with poorly controled diabetes and has been living alone since his daughter died Oct 21, 2018.  He has generally felt well despite his blood sugars being to high for his home monitor to measure.  Per the patient, his two sisters alternate daily visits and help him with his insulin.  He has not showed them his high blood sugar measures nor has he seen his PCP recently ("It has been over a year.")  So, no insulin adjustments have been made.  Patient fell today near his bed, struck his head and could not get up.  Fortunately, his sister came by shortly thereafter and called 66.  Issues: 1. Hyperosmolar, hyperglycemic state in a type 2 diabetic.  I do not see any precipitating cause. 2. AKI, likely from dehydration from osmotic diuresis of glycosuria. 3. Fall, no sig trauma.  Will need PT and OT evals to assess safety. 4. Lives alone.  This easily could have been a long lie syndrome.  Fortunately, his sister found him quickly.  We will assess safety in his dispo plan. 5. Grief: Daughter, who was also his housemate, died 4 months ago.  I worry about depression, healthy meals, medication compliance, etc.   Admit, hydrate, follow electrolytes and renal function as we correct blood sugar with insulin drip.   PT, OT evals.  May need SW involvement.

## 2019-01-27 NOTE — ED Notes (Addendum)
ED TO INPATIENT HANDOFF REPORT  ED Nurse Name and Phone #:  Jenny Reichmann [phone]  S Name/Age/Gender [name] [name] 73 y.o. male Room/Bed: 035C/035C  Code Status   Code Status: Full Code  Home/SNF/Other Home Patient oriented to: self place time  Is this baseline? Yes   Triage Complete: Triage complete  Chief Complaint Hyperglycemia  Triage Note Per [facility]: Pt has been having "HIGH" CBG readings since Monday. Family was able to bring CBG down to 350 on Monday but that it went back up and has not been able to come back down since then. Pt has had slurred speech since Monday that [name] or [name] not be worse. Last seen normal was last night around 6:30 pm. Family checked on pt this morning and noted that the pts right arm had a tremor. Pt is alert and oriented X 4. No complaints of pain. Temp normal with EMS. CBG read "HIGH" at home and with EMS. Pt is strong bilaterally and neurologically intact.    Allergies No Known Allergies  Level of Care/Admitting Diagnosis ED Disposition    ED Disposition Condition [facility] Area: [place] [[id number]]  Level of Care: Progressive [102]  Covid Evaluation: N/A  Diagnosis: HHS (hypothenar hammer syndrome) [facility]) [[id number]]  Admitting Physician: [name] [[id number]]  Attending Physician: [name] [[id number]]  Estimated length of stay: past midnight tomorrow  Certification:: I certify this patient will need inpatient services for at least 2 midnights  PT Class (Do Not Modify): Inpatient [101]  PT Acc Code (Do Not Modify): Private [1]       B Medical/Surgery History Past Medical History:  Diagnosis Date  . 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 Closed comminuted intertrochanteric fracture of left femur ([name] [name])   . Diabetes mellitus without complication ([name] [name])   . Diabetic hyperosmolar non-ketotic state ([name] [name]) 05/15/2016  . DKA (diabetic ketoacidoses) ([name] [name])  05/09/2016  . Hypertension   . Postoperative anemia due to acute blood loss 06/18/2016  . Retroperitoneal hematoma 06/18/2016  . Stroke ([place])   . Vitamin B 12 deficiency 06/18/2016   Past Surgical History:  Procedure Laterality Date  . HIP ARTHROPLASTY Right 02/05/2013   Procedure: ARTHROPLASTY BIPOLAR HIP;  Surgeon: [name] Bastos, MD;  Location: [facility];  Service: Orthopedics;  Laterality: Right;  . INTRAMEDULLARY (IM) NAIL INTERTROCHANTERIC Left 06/16/2016   Procedure: INTRAMEDULLARY (IM) NAIL INTERTROCHANTRIC;  Surgeon: [name] Can, MD;  Location: [facility];  Service: Orthopedics;  Laterality: Left;     A IV Location/Drains/Wounds Patient Lines/Drains/Airways Status   Active Line/Drains/Airways    Name:   Placement date:   Placement time:   Site:   Days:   Peripheral IV 01/27/19 Right Wrist   01/27/19    1147    Wrist   less than 1   External Urinary Catheter   01/27/19    1413    -   less than 1          Intake/Output Last 24 hours  Intake/Output Summary (Last 24 hours) at 01/27/2019 1821 Last data filed at 01/27/2019 1649 Gross per 24 hour  Intake 2000 ml  Output 750 ml  Net 1250 ml    Labs/Imaging Results for orders placed or performed during the hospital encounter of 01/27/19 (from the past 48 hour(s))  CBG monitoring, ED     Status: Abnormal   Collection Time: 01/27/19 11:22 AM  Result Value Ref Range   Glucose-Capillary >  600 (HH) 70 - 99 mg/dL  Urinalysis, Routine w reflex microscopic     Status: Abnormal   Collection Time: 01/27/19 11:25 AM  Result Value Ref Range   Color, Urine COLORLESS (A) YELLOW   APPearance CLEAR CLEAR   Specific Gravity, Urine 1.023 1.005 - 1.030   pH 6.0 5.0 - 8.0   Glucose, UA >=500 (A) NEGATIVE mg/dL   Hgb urine dipstick SMALL (A) NEGATIVE   Bilirubin Urine NEGATIVE NEGATIVE   Ketones, ur 5 (A) NEGATIVE mg/dL   Protein, ur NEGATIVE NEGATIVE mg/dL   Nitrite NEGATIVE NEGATIVE   Leukocytes,Ua NEGATIVE NEGATIVE   RBC / HPF 0-5 0 - 5  RBC/hpf   WBC, UA 0-5 0 - 5 WBC/hpf   Bacteria, UA NONE SEEN NONE SEEN    Comment: Performed at [facility], [place] [address] [place], [place] [address]  Basic metabolic panel     Status: Abnormal   Collection Time: 01/27/19 11:49 AM  Result Value Ref Range   Sodium 118 (LL) 135 - 145 mmol/L    Comment: NO VISIBLE HEMOLYSIS CRITICAL RESULT CALLED TO, READ BACK BY AND VERIFIED WITH: [name] [name],C RN @1242  ON [id number] BY FLEMINGS    Potassium 6.3 (HH) 3.5 - 5.1 mmol/L    Comment: NO VISIBLE HEMOLYSIS CRITICAL RESULT CALLED TO, READ BACK BY AND VERIFIED WITH: [name] [name],C RN @1242  ON [id number] BY FLEMINGS    Chloride 84 (L) 98 - 111 mmol/L   CO2 17 (L) 22 - 32 mmol/L   Glucose, Bld 1,191 (HH) 70 - 99 mg/dL    Comment: RESULTS CONFIRMED BY MANUAL DILUTION CRITICAL RESULT CALLED TO, READ BACK BY AND VERIFIED WITH: [name] [name],C RN @1243  ON [id number] BY FLEMINGS    BUN 49 (H) 8 - 23 mg/dL   Creatinine, Ser 2.53 (H) 0.61 - 1.24 mg/dL   Calcium 9.3 8.9 - 10.3 mg/dL   GFR calc non Af Amer 24 (L) >60 mL/min   GFR calc Af Amer 28 (L) >60 mL/min   Anion gap 17 (H) 5 - 15    Comment: Performed at [facility], [place] [address], [place], [place] [address]  CBC with Differential     Status: Abnormal   Collection Time: 01/27/19 11:49 AM  Result Value Ref Range   WBC 6.1 4.0 - 10.5 K/uL   RBC 4.35 4.22 - 5.81 MIL/uL   Hemoglobin 12.6 (L) 13.0 - 17.0 g/dL   HCT 44.8 39.0 - 52.0 %   MCV 103.0 (H) 80.0 - 100.0 fL   MCH 29.0 26.0 - 34.0 pg   MCHC 28.1 (L) 30.0 - 36.0 g/dL   RDW 13.0 11.5 - 15.5 %   Platelets 218 150 - 400 K/uL   nRBC 0.0 0.0 - 0.2 %   Neutrophils Relative % 76 %   Neutro Abs 4.7 1.7 - 7.7 K/uL   Lymphocytes Relative 13 %   Lymphs Abs 0.8 0.7 - 4.0 K/uL   Monocytes Relative 8 %   Monocytes Absolute 0.5 0.1 - 1.0 K/uL   Eosinophils Relative 1 %   Eosinophils Absolute 0.1 0.0 - 0.5 K/uL   Basophils Relative 1 %   Basophils Absolute 0.0 0.0 - 0.1 K/uL   Immature  Granulocytes 1 %   Abs Immature Granulocytes 0.03 0.00 - 0.07 K/uL    Comment: Performed at [facility] [address], [place], [place] [address]  Ethanol     Status: None   Collection Time: 01/27/19 12:50 PM  Result Value Ref Range   Alcohol, Ethyl (B) <10 <10 mg/dL    Comment: (NOTE) Lowest detectable limit for serum alcohol is 10 mg/dL. For medical purposes only. Performed at [facility], [place] [address] [place], [place] [address]   CBG monitoring, ED     Status: Abnormal   Collection Time: 01/27/19  1:22 PM  Result Value Ref Range   Glucose-Capillary >600 (HH) 70 - 99 mg/dL  POCT I-Stat EG7     Status: Abnormal   Collection Time: 01/27/19  2:12 PM  Result Value Ref Range   pH, Ven 7.413 7.250 - 7.430   pCO2, Ven 29.3 (L) 44.0 - 60.0 mmHg   pO2, Ven 46.0 (H) 32.0 - 45.0 mmHg   Bicarbonate 18.7 (L) 20.0 - 28.0 mmol/L   TCO2 20 (L) 22 - 32 mmol/L   O2 Saturation 83.0 %   Acid-base deficit 5.0 (H) 0.0 - 2.0 mmol/L   Sodium 123 (L) 135 - 145 mmol/L   Potassium 5.6 (H) 3.5 - 5.1 mmol/L   Calcium, Ion 0.98 (L) 1.15 - 1.40 mmol/L   HCT 39.0 39.0 - 52.0 %   Hemoglobin 13.3 13.0 - 17.0 g/dL   Patient temperature HIDE    Sample type VENOUS   CBG monitoring, ED     Status: Abnormal   Collection Time: 01/27/19  2:36 PM  Result Value Ref Range   Glucose-Capillary >600 (HH) 70 - 99 mg/dL  CBG monitoring, ED     Status: Abnormal   Collection Time: 01/27/19  3:39 PM  Result Value Ref Range   Glucose-Capillary >600 (HH) 70 - 99 mg/dL   Comment 1 Notify RN    Comment 2 Document in Chart   Magnesium     Status: Abnormal   Collection Time: 01/27/19  3:49 PM  Result Value Ref Range   Magnesium 2.5 (H) 1.7 - 2.4 mg/dL    Comment: Performed at [facility], [address] [place], [place] [address]  Troponin I - Once     Status: None   Collection Time: 01/27/19  4:06 PM  Result Value Ref Range   Troponin I <0.03 <0.03 ng/mL    Comment: Performed at [facility] [address] [place], [place] [address]  Lipid panel     Status: Abnormal   Collection Time: 01/27/19  4:06 PM  Result Value Ref Range   Cholesterol 294 (H) 0 - 200 mg/dL   Triglycerides 380 (H) <150 mg/dL   HDL 66 >40 mg/dL   Total CHOL/HDL Ratio 4.5 RATIO   VLDL 76 (H) 0 - 40 mg/dL   LDL Cholesterol 152 (H) 0 - 99 mg/dL    Comment:        Total Cholesterol/HDL:CHD Risk Coronary Heart Disease Risk Table                     Men   Women  1/2 Average Risk   3.4   3.3  Average Risk       5.0   4.4  2 X Average Risk   9.6   7.1  3 X Average Risk  23.4   11.0        Use the calculated Patient Ratio above and the CHD Risk Table to determine the patient's CHD Risk.        ATP III CLASSIFICATION (LDL):  <100     mg/dL   Optimal  100-129  mg/dL  Near or Above                    Optimal  130-159  mg/dL   Borderline  160-189  mg/dL   High  >190     mg/dL   Very High Performed at [facility] [address] [place], [place] [address]   CBG monitoring, ED     Status: Abnormal   Collection Time: 01/27/19  4:45 PM  Result Value Ref Range   Glucose-Capillary 526 (HH) 70 - 99 mg/dL   Comment 1 Document in Chart   CBG monitoring, ED     Status: Abnormal   Collection Time: 01/27/19  5:49 PM  Result Value Ref Range   Glucose-Capillary 320 (H) 70 - 99 mg/dL   Comment 1 Notify RN    Comment 2 Document in Chart    Dg Chest 2 View  Result Date: 01/27/2019 CLINICAL DATA:  Hypothenar hammer syndrome EXAM: CHEST - 2 VIEW COMPARISON:  10/03/2018 FINDINGS: The heart size and mediastinal contours are within normal limits. Both lungs are clear. The visualized skeletal structures are unremarkable. IMPRESSION: No active cardiopulmonary disease. Electronically Signed   By: Franchot Gallo M.D.   On: 01/27/2019 16:39   Ct Head Wo Contrast  Result Date: 01/27/2019 CLINICAL DATA:  Pain following fall EXAM: CT HEAD WITHOUT CONTRAST TECHNIQUE: Contiguous axial images were obtained from  the base of the skull through the vertex without intravenous contrast. COMPARISON:  October 02, 2018 FINDINGS: Brain: Mild to moderate diffuse atrophy is stable. There is no intracranial mass, hemorrhage, extra-axial fluid collection, or midline shift. There is small vessel disease in the centra semiovale bilaterally, stable. No acute infarct evident. Vascular: No hyperdense vessel. There is calcification in each distal vertebral artery and carotid siphon regions bilaterally. Skull: The bony calvarium appears intact. Sinuses/Orbits: There is an air-fluid level in the left maxillary antrum. There is opacification throughout much of the right sphenoid sinus. Orbits appear symmetric bilaterally. Other: Mastoid air cells are clear. There is debris in each external auditory canal. IMPRESSION: Atrophy with patchy periventricular small vessel disease. No acute infarct. No mass or hemorrhage. There are foci of arterial vascular calcification. Foci of paranasal sinus disease noted. There is probable cerumen in each external auditory canal. Electronically Signed   By: Lowella Grip III M.D.   On: 01/27/2019 12:56    Pending Labs Unresulted Labs (From admission, onward)    Start     Ordered   01/28/19 0500  CBC  Tomorrow morning,   R     01/27/19 1556   01/27/19 1551  Hemoglobin A1c  Once,   R     01/27/19 1556   01/27/19 8366  Basic metabolic panel  STAT Now then every 4 hours ,   STAT     01/27/19 1556   01/27/19 1125  Urine culture  ONCE - STAT,   STAT     01/27/19 1124          Vitals/Pain Today's Vitals   01/27/19 1700 01/27/19 1715 01/27/19 1730 01/27/19 1745  BP: 130/72 138/72 (!) 143/66 116/79  Pulse: (!) 56 (!) 56 (!) 55 (!) 53  Resp: 19 16 16 16   Temp:      SpO2: 96% 96% 97% 97%  Weight:      Height:      PainSc:        Isolation Precautions No active isolations  Medications Medications  0.9 %  sodium chloride  infusion ( Intravenous New Bag/Given 01/27/19 1610)  dextrose 5  %-0.45 % sodium chloride infusion (has no administration in time range)  insulin regular, human (MYXREDLIN) 100 units/ 100 mL infusion (7.8 Units/hr Intravenous Rate/Dose Change 01/27/19 1753)  enoxaparin (LOVENOX) injection 30 mg (has no administration in time range)  acetaminophen (TYLENOL) tablet 650 mg (has no administration in time range)    Or  acetaminophen (TYLENOL) suppository 650 mg (has no administration in time range)  ondansetron (ZOFRAN) tablet 4 mg (has no administration in time range)    Or  ondansetron (ZOFRAN) injection 4 mg (has no administration in time range)  sodium chloride 0.9 % bolus 1,000 mL (0 mLs Intravenous Stopped 01/27/19 1649)  sodium chloride 0.9 % bolus 1,000 mL (0 mLs Intravenous Stopped 01/27/19 1543)  0.9 %  sodium chloride infusion ( Intravenous New Bag/Given 01/27/19 1654)    Mobility walks with device Low fall risk   Focused Assessments Cardiac Assessment Handoff:  Cardiac Rhythm: Normal sinus rhythm Lab Results  Component Value Date   CKTOTAL 100 06/27/2017   TROPONINI <0.03 01/27/2019   No results found for: DDIMER Does the Patient currently have chest pain? No     R Recommendations: See Admitting Provider Note  Report given to:   Additional Notes:  CXR: No acute cardiopulmonary disease EKG: NSR, LAFB, old anteroseptal infarct Pt mentation at baseline Most recent CBG 320 Pt has condom cath

## 2019-01-28 ENCOUNTER — Other Ambulatory Visit: Payer: Self-pay

## 2019-01-28 LAB — BASIC METABOLIC PANEL
Anion gap: 12 (ref 5–15)
Anion gap: 10 (ref 5–15)
Anion gap: 11 (ref 5–15)
BUN: 33 mg/dL — ABNORMAL HIGH (ref 8–23)
BUN: 24 mg/dL — ABNORMAL HIGH (ref 8–23)
BUN: 25 mg/dL — ABNORMAL HIGH (ref 8–23)
CO2: 19 mmol/L — ABNORMAL LOW (ref 22–32)
CO2: 21 mmol/L — ABNORMAL LOW (ref 22–32)
CO2: 19 mmol/L — ABNORMAL LOW (ref 22–32)
Calcium: 8.7 mg/dL — ABNORMAL LOW (ref 8.9–10.3)
Calcium: 8.4 mg/dL — ABNORMAL LOW (ref 8.9–10.3)
Calcium: 8.4 mg/dL — ABNORMAL LOW (ref 8.9–10.3)
Chloride: 109 mmol/L (ref 98–111)
Chloride: 105 mmol/L (ref 98–111)
Chloride: 108 mmol/L (ref 98–111)
Creatinine, Ser: 1.65 mg/dL — ABNORMAL HIGH (ref 0.61–1.24)
Creatinine, Ser: 1.79 mg/dL — ABNORMAL HIGH (ref 0.61–1.24)
Creatinine, Ser: 1.81 mg/dL — ABNORMAL HIGH (ref 0.61–1.24)
GFR calc Af Amer: 47 mL/min — ABNORMAL LOW (ref >60)
GFR calc Af Amer: 43 mL/min — ABNORMAL LOW (ref >60)
GFR calc Af Amer: 42 mL/min — ABNORMAL LOW (ref >60)
GFR calc non Af Amer: 41 mL/min — ABNORMAL LOW (ref >60)
GFR calc non Af Amer: 37 mL/min — ABNORMAL LOW (ref >60)
GFR calc non Af Amer: 36 mL/min — ABNORMAL LOW (ref >60)
Glucose, Bld: 200 mg/dL — ABNORMAL HIGH (ref 70–99)
Glucose, Bld: 205 mg/dL — ABNORMAL HIGH (ref 70–99)
Glucose, Bld: 225 mg/dL — ABNORMAL HIGH (ref 70–99)
Potassium: 4.2 mmol/L (ref 3.5–5.1)
Potassium: 4.3 mmol/L (ref 3.5–5.1)
Potassium: 4.1 mmol/L (ref 3.5–5.1)
Sodium: 140 mmol/L (ref 135–145)
Sodium: 136 mmol/L (ref 135–145)
Sodium: 138 mmol/L (ref 135–145)

## 2019-01-28 LAB — GLUCOSE, CAPILLARY
Glucose-Capillary: 166 mg/dL — ABNORMAL HIGH (ref 70–99)
Glucose-Capillary: 177 mg/dL — ABNORMAL HIGH (ref 70–99)
Glucose-Capillary: 180 mg/dL — ABNORMAL HIGH (ref 70–99)
Glucose-Capillary: 230 mg/dL — ABNORMAL HIGH (ref 70–99)
Glucose-Capillary: 248 mg/dL — ABNORMAL HIGH (ref 70–99)

## 2019-01-28 LAB — HEMOGLOBIN A1C
Hgb A1c MFr Bld: >15.5 % — ABNORMAL HIGH (ref 4.8–5.6)
Mean Plasma Glucose: >398 mg/dL

## 2019-01-28 LAB — URINE CULTURE: Culture: NO GROWTH

## 2019-01-28 LAB — CBC
HCT: 40.7 % (ref 39.0–52.0)
Hemoglobin: 13.8 g/dL (ref 13.0–17.0)
MCH: 30.1 pg (ref 26.0–34.0)
MCHC: 33.9 g/dL (ref 30.0–36.0)
MCV: 88.7 fL (ref 80.0–100.0)
Platelets: 170 10*3/uL (ref 150–400)
RBC: 4.59 MIL/uL (ref 4.22–5.81)
RDW: 12.7 % (ref 11.5–15.5)
WBC: 6.3 10*3/uL (ref 4.0–10.5)
nRBC: 0 % (ref 0.0–0.2)

## 2019-01-28 MED ORDER — ENOXAPARIN SODIUM 40 MG/0.4ML ~~LOC~~ SOLN
40.0000 mg | SUBCUTANEOUS | Status: DC
  Administered 2019-01-28 – 2019-01-29 (×2): 40 mg via SUBCUTANEOUS
  Filled 2019-01-28 (×2): qty 0.4

## 2019-01-28 MED ORDER — INSULIN ASPART PROT & ASPART (70-30 MIX) 100 UNIT/ML ~~LOC~~ SUSP: 5.0000 [IU] | Freq: Every day | SUBCUTANEOUS | Status: DC

## 2019-01-28 MED ORDER — INSULIN ASPART PROT & ASPART (70-30 MIX) 100 UNIT/ML ~~LOC~~ SUSP
3.0000 [IU] | Freq: Once | SUBCUTANEOUS | Status: AC
  Administered 2019-01-28: 17:00:00 3 [IU] via SUBCUTANEOUS
  Filled 2019-01-28: qty 10

## 2019-01-28 MED ORDER — INSULIN ASPART PROT & ASPART (70-30 MIX) 100 UNIT/ML ~~LOC~~ SUSP
10.0000 [IU] | Freq: Every day | SUBCUTANEOUS | Status: DC
  Administered 2019-01-29: 09:00:00 10 [IU] via SUBCUTANEOUS
  Filled 2019-01-28: qty 10

## 2019-01-28 MED ORDER — INSULIN ASPART PROT & ASPART (70-30 MIX) 100 UNIT/ML ~~LOC~~ SUSP: 10.0000 [IU] | Freq: Every day | SUBCUTANEOUS | Status: DC

## 2019-01-28 MED ORDER — INSULIN ASPART PROT & ASPART (70-30 MIX) 100 UNIT/ML ~~LOC~~ SUSP
5.0000 [IU] | Freq: Every day | SUBCUTANEOUS | Status: DC
  Filled 2019-01-28: qty 10

## 2019-01-28 MED ORDER — SODIUM CHLORIDE 0.9 % IV SOLN
INTRAVENOUS | Status: DC
  Administered 2019-01-28 (×2): via INTRAVENOUS

## 2019-01-28 NOTE — Progress Notes (Signed)
Patient alert and oriented x4, noted on glucose stabilizer for DKA.  Blood sugars improved with hourly assess, lowest noted at  111mg /dl.  Was able to stop insulin drip per algorithm around 11pm.  Family Medicine notified [name] Boston, MD provided orders to stop drip two hours post lantus administration.  Blood glucose rechecked x 2 hours and noted 288mg /dl. Family Med notified again orders received to stop D5 0.45% NS.  No hypoglycemic episodes noted post drip.  Follow up BS noted 188mg /dl at 0620. Remains in stable condition.  Will continue to monitor.

## 2019-01-28 NOTE — Progress Notes (Addendum)
Inpatient Diabetes Program Recommendations  AACE/ADA: New Consensus Statement on Inpatient Glycemic Control (2015)  Target Ranges:  Prepandial:   less than 140 mg/dL      Peak postprandial:   less than 180 mg/dL (1-2 hours)      Critically ill patients:  140 - 180 mg/dL   Lab Results  Component Value Date   GLUCAP 166 (H) 01/28/2019   HGBA1C 11.1 (H) 10/03/2018    Review of Glycemic Control Results for NAI, DASCH (MRN [id number]) as of 01/28/2019 09:41  Ref. Range 01/27/2019 23:33 01/28/2019 00:51 01/28/2019 07:52  Glucose-Capillary Latest Ref Range: 70 - 99 mg/dL 132 (H) 180 (H) 166 (H)   Diabetes history: Type 2 DM Outpatient Diabetes medications: Novolin 70/30 12 units BID Current orders for Inpatient glycemic control: Lantus 14 units QHS, Novolog 0-9 units TID  Inpatient Diabetes Program Recommendations:    Noted consult, A1C in process. Per MD note, sisters have been helping with insulin administration. Contacted RN this am to have him demonstrate insulin injection for the next scheduled dose and will plan to reach out to patient and family.  At this time, in agreement with current orders.   Addendum@1400 : Spoke with patient regarding outpatient diabetes management. Patient states he has been taking Novolin 70/30 3 units BID. Patient is unsure and confused when asked who perscribed this dose. Admits to missing doses occasionally, "It's been hard since losing my daughter in December." Reviewed patient's current A1c of >15.5%. Explained what a A1c is and what it measures. Also reviewed goal A1c with patient, importance of good glucose control @ home, and blood sugar goals. Reviewed patho of DM , need for insulin, role of pancreas, vascular changes and co-morbidies. Reviewed at length new doses, when to inject, sick day rules, survival skills and the importance of following up with PCP. Patient has a meter and checks blood sugars occasionally, he says they usually run in the 200's mg/dL.  However, in asking specifics patient cannot answer, so I doubt these values based on conversation. Patient has two sisters that check on him and ensure he "gets what he needs". Drinks juice and milk occasionally. Encouraged to be mindful of amount of carbohydrates he consumes and discussed why water and unsweetened beverages were better options. Patient to set up appointment with PCP, reminded that it was Friday and to go ahead and set up today.   @1430 : Spoke with his sister by phone to relay same information to create consistency. She reports: "I go over in the evenings, my other sister goes in the mornings. We share the responsibility. We care for our mother who is 73 years old and a disabled brother with Down syndrome. We don't have time to stay and provide care."  Reviewed patient's current A1c of >15.5%. Explained what a A1c is and what it measures. Also reviewed goal A1c with patient, importance of good glucose control @ home, and blood sugar goals. Reviewed patho of DM, need for insulin, role of pancreas, vascular changes and co-morbidies.  We discussed what the patient had been doing at home; injecting 70/30 3 units BID and admitting to missing doses compared to inpatient doses and previous home regimen. We discussed the need for someone to help provide/oversee injections, reviewed the doses, and survival skills. Sister able to state new dosages. Expressed great frustration with the situation. Feels that he needs SNF. She states, "I simply cannot do it all, but I can do the evening check that includes BP, CBG, and medicine." She  states, " I am frustrated with this because he goes through a gallon of milk and drinks juice, despite our help. He is stubborn." Discussed that [place] maybe an option. Encouragement provided. She plans to relay information to other family members who will be providing care. Has supplies and things they need. Will plan to attach PCP numbers so she can aide patient in making a  follow up appointment. Plan to relay concerns with team regarding outpatient plan. No further questions at this time.  @1509 : Discussed with resident the outpatient plan for discharge for this patient. Concerned for long term compliance and self care in the event he goes home by himself, in the setting of dementia, as well as recommendations per PT/OT. Per sister's report, appear to provide minimal care and even with long conversations regarding importance and dosages, I feel patient would be mostly alone. Thus, the reservations for sending home for self care and self injections. Md to share with team and develop a plan.   Thanks, Bronson Curb, MSN, RNC-OB Diabetes Coordinator [phone] (8a-5p)

## 2019-01-28 NOTE — Evaluation (Signed)
Physical Therapy Evaluation Patient Details Name: [name] [name] MRN: [id number] DOB: [birth year] Today's Date: 01/28/2019   History of Present Illness  Pt is a 73 y/o male admitted secondary to fall and HHS. PMH includes DM, CKD, HTN, dementia, CVA, and R hip arthroplasty.   Clinical Impression  Pt admitted secondary to problem above with deficits below. Pt unsteady during gait without AD, however, pt refusing to use RW. Pt with decreased safety awareness and required min to mod A for mobility tasks this session. Pt currently lives alone and feel he is a high fall risk. Educated about SNF, however, pt will likely refuse. Will need max HH services if refuses. Will continue to follow acutely to maximize functional mobility independence and safety.     Follow Up Recommendations SNF;Supervision/Assistance - 24 hour    Equipment Recommendations  None recommended by PT    Recommendations for Other Services       Precautions / Restrictions Precautions Precautions: Fall Restrictions Weight Bearing Restrictions: No      Mobility  Bed Mobility Overal bed mobility: Needs Assistance Bed Mobility: Supine to Sit;Sit to Supine     Supine to sit: Supervision Sit to supine: Supervision   General bed mobility comments: Supervision for safety.   Transfers Overall transfer level: Needs assistance Equipment used: None Transfers: Sit to/from Stand Sit to Stand: Min assist;Mod assist         General transfer comment: Min A for lift assist and steadying assist to stand. Upon return to room, pt attempting to prematurely sit and required multimodal cues and mod A for controlled descent to EOB. Educated about importance of feeling surface on back of legs prior to sitting.   Ambulation/Gait Ambulation/Gait assistance: Min Web designer (Feet): 50 Feet Assistive device: IV Pole Gait Pattern/deviations: Step-through pattern;Decreased stride length;Trunk flexed;Drifts right/left Gait  velocity: Decreased    General Gait Details: Pt with unsteady gait with use of IV pole. Pt with very flexed posture. Required min A for steadying; pt refusing to use RW.   Stairs            Wheelchair Mobility    Modified Rankin (Stroke Patients Only)       Balance Overall balance assessment: Needs assistance Sitting-balance support: No upper extremity supported;Feet supported Sitting balance-Leahy Scale: Fair     Standing balance support: Single extremity supported Standing balance-Leahy Scale: Poor Standing balance comment: reliant on external and at least 1 UE support                              Pertinent Vitals/Pain Pain Assessment: No/denies pain    Home Living Family/patient expects to be discharged to:: Private residence Living Arrangements: Alone Available Help at Discharge: Family;Available PRN/intermittently Type of Home: Apartment Home Access: Elevator     Home Layout: One level Home Equipment: Walker - 2 wheels      Prior Function Level of Independence: Independent         Comments: Pt reports he was independent with mobility. Has RW, however, did not use.     Hand Dominance        Extremity/Trunk Assessment   Upper Extremity Assessment Upper Extremity Assessment: Defer to OT evaluation    Lower Extremity Assessment Lower Extremity Assessment: Generalized weakness    Cervical / Trunk Assessment Cervical / Trunk Assessment: Kyphotic  Communication   Communication: Expressive difficulties;Other (comment)(slurred speech)  Cognition Arousal/Alertness: Awake/alert Behavior During Therapy: WFL for tasks  assessed/performed Overall Cognitive Status: History of cognitive impairments - at baseline                                 General Comments: Per chart hx of dementia. Pt with poor awareness of safety and deficits.       General Comments      Exercises     Assessment/Plan    PT Assessment Patient  needs continued PT services  PT Problem List Decreased strength;Decreased balance;Decreased cognition;Decreased mobility;Decreased knowledge of use of DME;Decreased knowledge of precautions       PT Treatment Interventions Gait training;DME instruction;Functional mobility training;Therapeutic activities;Therapeutic exercise;Balance training;Patient/family education    PT Goals (Current goals can be found in the Care Plan section)  Acute Rehab PT Goals Patient Stated Goal: to go home PT Goal Formulation: With patient Time For Goal Achievement: 02/11/19 Potential to Achieve Goals: Good    Frequency Min 3X/week   Barriers to discharge Decreased caregiver support      Co-evaluation               AM-PAC PT "6 Clicks" Mobility  Outcome Measure Help needed turning from your back to your side while in a flat bed without using bedrails?: None Help needed moving from lying on your back to sitting on the side of a flat bed without using bedrails?: A Little Help needed moving to and from a bed to a chair (including a wheelchair)?: A Little Help needed standing up from a chair using your arms (e.g., wheelchair or bedside chair)?: A Little Help needed to walk in hospital room?: A Little Help needed climbing 3-5 steps with a railing? : A Lot 6 Click Score: 18    End of Session Equipment Utilized During Treatment: Gait belt Activity Tolerance: Patient tolerated treatment well Patient left: in bed;with call bell/phone within reach;with bed alarm set Nurse Communication: Mobility status PT Visit Diagnosis: Unsteadiness on feet (R26.81);History of falling (Z91.81);Muscle weakness (generalized) (M62.81)    Time: 0100-7121 PT Time Calculation (min) (ACUTE ONLY): 13 min   Charges:   PT Evaluation $PT Eval Moderate Complexity: [place], PT, DPT  Acute Rehabilitation Services  Pager: [phone] Office: [phone]   Rudean Hitt 01/28/2019, 1:32 PM

## 2019-01-28 NOTE — Evaluation (Signed)
Occupational Therapy Evaluation Patient Details Name: [name] [name] MRN: [id number] DOB: 1946/03/20 Today's Date: 01/28/2019    History of Present Illness Pt is a 73 y/o male admitted secondary to fall and HHS. PMH includes DM, CKD, HTN, dementia, CVA, and R hip arthroplasty.    Clinical Impression   Pt PTA: living alone, sister reports that pt does not bathe properly to RN. Currently, OTR unable to convince pt to bathe at this time. Pt performing bed mobility with minguardA; transfers with minguardA to minA and adl functional mobility with minguardA with RW. Pt unsteady without Rw and unable to properly are for self. Pt very defensive regarding ADL tasks and would not revisit topic of washing face or underarms. Pt unfit to go home alone as pt has poro activity tolerance, decreased strength and requires increased assist for ADL. Pt requires continued OT for ADL, mobility and safety in SNF setting. OT to follow acutely.    Follow Up Recommendations  SNF;Supervision/Assistance - 24 hour    Equipment Recommendations  3 in 1 bedside commode    Recommendations for Other Services       Precautions / Restrictions Precautions Precautions: Fall Restrictions Weight Bearing Restrictions: No      Mobility Bed Mobility Overal bed mobility: Needs Assistance Bed Mobility: Supine to Sit;Sit to Supine     Supine to sit: Supervision Sit to supine: Supervision   General bed mobility comments: Supervision for safety.   Transfers Overall transfer level: Needs assistance Equipment used: None Transfers: Sit to/from Stand Sit to Stand: Min assist;Mod assist         General transfer comment: minA for power up and steadying with minguardA    Balance Overall balance assessment: Needs assistance Sitting-balance support: No upper extremity supported;Feet supported Sitting balance-Leahy Scale: Fair     Standing balance support: Single extremity supported Standing balance-Leahy Scale:  Poor Standing balance comment: reliant on external of RW                           ADL either performed or assessed with clinical judgement   ADL Overall ADL's : Needs assistance/impaired Eating/Feeding: Modified independent;Sitting   Grooming: Set up;Sitting   Upper Body Bathing: Min guard;Sitting   Lower Body Bathing: Minimal assistance;Sitting/lateral leans;Sit to/from stand   Upper Body Dressing : Set up;Sitting;Standing   Lower Body Dressing: Minimal assistance;Sitting/lateral leans;Sit to/from stand   Toilet Transfer: Min guard;RW   Toileting- Water quality scientist and Hygiene: Minimal assistance;Sitting/lateral lean;Sit to/from stand       Functional mobility during ADLs: Minimal assistance;Rolling walker(unsteady) General ADL Comments: Pt denying need to perform grooming or bathing at this time adamantly. Pt transferred to commode and donned/doffed socks.     Vision Baseline Vision/History: No visual deficits Patient Visual Report: No change from baseline Vision Assessment?: No apparent visual deficits     Perception     Praxis      Pertinent Vitals/Pain Pain Assessment: No/denies pain     Hand Dominance Right   Extremity/Trunk Assessment Upper Extremity Assessment Upper Extremity Assessment: Generalized weakness   Lower Extremity Assessment Lower Extremity Assessment: Generalized weakness   Cervical / Trunk Assessment Cervical / Trunk Assessment: Kyphotic   Communication Communication Communication: Expressive difficulties;Other (comment)(slurred speech)   Cognition Arousal/Alertness: Awake/alert Behavior During Therapy: WFL for tasks assessed/performed Overall Cognitive Status: History of cognitive impairments - at baseline  General Comments: Per chart hx of dementia. Pt with poor awareness of safety and deficits.    General Comments       Exercises     Shoulder Instructions       Home Living Family/patient expects to be discharged to:: Private residence Living Arrangements: Alone Available Help at Discharge: Family;Available PRN/intermittently Type of Home: Apartment Home Access: Elevator     Home Layout: One level     Bathroom Shower/Tub: Occupational psychologist: Standard     Home Equipment: Environmental consultant - 2 wheels          Prior Functioning/Environment Level of Independence: Independent  Gait / Transfers Assistance Needed: RW for mobility ADL's / Homemaking Assistance Needed: Pt reports independence with bathing/dressing, but RN reports that family wanted pt to have a bath as he does not do so at home.    Comments: Pt reports he was independent with mobility. Has RW, however, did not use.        OT Problem List: Decreased strength;Decreased activity tolerance;Impaired balance (sitting and/or standing);Decreased coordination;Decreased cognition;Decreased safety awareness      OT Treatment/Interventions: Self-care/ADL training;Therapeutic exercise;Neuromuscular education;Energy conservation;Therapeutic activities;Visual/perceptual remediation/compensation;Patient/family education;Balance training    OT Goals(Current goals can be found in the care plan section) Acute Rehab OT Goals Patient Stated Goal: to go home OT Goal Formulation: With patient Time For Goal Achievement: 02/11/19 Potential to Achieve Goals: Good ADL Goals Pt Will Perform Grooming: with modified independence;standing Pt Will Perform Upper Body Bathing: with modified independence;standing Pt Will Perform Lower Body Bathing: with modified independence;sit to/from stand Pt Will Perform Toileting - Clothing Manipulation and hygiene: with modified independence;sit to/from stand Additional ADL Goal #1: Pt will stand for x5 mins for ADL routine with fair balance.  OT Frequency: Min 3X/week   Barriers to D/C:            Co-evaluation              AM-PAC OT "6  Clicks" Daily Activity     Outcome Measure Help from another person eating meals?: None Help from another person taking care of personal grooming?: A Little Help from another person toileting, which includes using toliet, bedpan, or urinal?: A Lot Help from another person bathing (including washing, rinsing, drying)?: A Little Help from another person to put on and taking off regular upper body clothing?: None Help from another person to put on and taking off regular lower body clothing?: None 6 Click Score: 20   End of Session Equipment Utilized During Treatment: Gait belt;Rolling walker Nurse Communication: Mobility status  Activity Tolerance: Patient tolerated treatment well Patient left: in bed;with call bell/phone within reach;with bed alarm set  OT Visit Diagnosis: Unsteadiness on feet (R26.81);Muscle weakness (generalized) (M62.81)                Time: 4585-9292 OT Time Calculation (min): 26 min Charges:  OT General Charges $OT Visit: 1 Visit OT Evaluation $OT Eval Moderate Complexity: 1 Mod OT Treatments $Self Care/Home Management : 8-22 mins  Ebony Hail Harold Hedge) Marsa Aris OTR/L Acute Rehabilitation Services Pager: [phone] Office: [place] 01/28/2019, 2:56 PM

## 2019-01-28 NOTE — Plan of Care (Signed)

## 2019-01-28 NOTE — Progress Notes (Signed)
Family Medicine Teaching Service Daily Progress Note Intern Pager: [phone]  Patient name: [name] [name] Medical record number: [id number] Date of birth: March 26, 1946 Age: 73 y.o. Gender: male  Primary Care Provider: Azzie Glatter, [name] Consultants: none Code Status: Full  Pt Overview and Major Events to Date:  4/16 admitted for HHS  Assessment and Plan: [name] [name] is a 73 y.o. male presenting with a fall and found to have HHS. PMH is significant for T2DM, HTN, history of stroke.  HHS, resolved. T2DM. Uncontrolled with a1c >15.5. Patient reports only taking 3U BID of 70/30 as outpatient with poor medication compliance. Now on subcutaneous lantus 14U since last night with CBGs ranging 132-205. - transition to insulin 70/30 as this is what patient can afford. 10U in the morning and 5U before evening meal. Since he already received lantus last night tonight's dose will be only 3U.  - DM education, appreciate assistance  CKD III. Stable. Appears to be at baseline Cr 1.8 - monitor   HTN. BPs normotensive - will restart home norvasc on DC  Poor social situation.  Patient lives at home alone and has his sisters checking on him occasionally.  He was found down fortunately for only a short period of time prior to admission. -PT OT consulted  FEN/GI: carb modified diet PPx: lovenox  Disposition: pending PT/OT recommendations. If safe for home can be discharged later today.  Subjective:  Feels well this morning. Wants to go home. States that he has only been taking 3U of his insulin twice a day at home.   Objective: Temp:  [97.6 F (36.4 C)-98.9 F (37.2 C)] 98.9 F (37.2 C) (04/17 0337) Pulse Rate:  [33-71] 57 (04/17 0344) Resp:  [14-21] 20 (04/16 1912) BP: (105-152)/(62-88) 111/62 (04/17 0344) SpO2:  [92 %-100 %] 100 % (04/17 0337) Weight:  [64.6 kg-66.2 kg] 64.6 kg (04/17 0609) Physical Exam: General: laying in bed comfortably, in NAD Cardiovascular: RRR, no  murmurs Respiratory: CTAB, NWOB on RA  Abdomen: soft, nontender, nondistended, + bowel sounds Extremities: WWP, no LE edema  Laboratory: Recent Labs  Lab 01/27/19 1149 01/27/19 1412  WBC 6.1  --   HGB 12.6* 13.3  HCT 44.8 39.0  PLT 218  --    Recent Labs  Lab 01/27/19 1952 01/28/19 0131 01/28/19 0720  NA 137 140 136  K 6.2* 4.2 4.3  CL 110 109 105  CO2 12* 19* 21*  BUN 41* 33* 24*  CREATININE 1.94* 1.65* 1.79*  CALCIUM 9.4 8.7* 8.4*  GLUCOSE 291* 200* 205*    Imaging/Diagnostic Tests: Dg Chest 2 View  Result Date: 01/27/2019 CLINICAL DATA:  Hypothenar hammer syndrome EXAM: CHEST - 2 VIEW COMPARISON:  10/03/2018 FINDINGS: The heart size and mediastinal contours are within normal limits. Both lungs are clear. The visualized skeletal structures are unremarkable. IMPRESSION: No active cardiopulmonary disease. Electronically Signed   By: Franchot Gallo M.D.   On: 01/27/2019 16:39    Bufford Lope, DO 01/28/2019, 8:44 AM PGY-3, [place] Intern pager: [phone], text pages welcome

## 2019-01-29 DIAGNOSIS — R531 Weakness: Secondary | ICD-10-CM

## 2019-01-29 LAB — BASIC METABOLIC PANEL
Anion gap: 10 (ref 5–15)
BUN: 19 mg/dL (ref 8–23)
CO2: 20 mmol/L — ABNORMAL LOW (ref 22–32)
Calcium: 8.5 mg/dL — ABNORMAL LOW (ref 8.9–10.3)
Chloride: 106 mmol/L (ref 98–111)
Creatinine, Ser: 1.51 mg/dL — ABNORMAL HIGH (ref 0.61–1.24)
GFR calc Af Amer: 52 mL/min — ABNORMAL LOW (ref >60)
GFR calc non Af Amer: 45 mL/min — ABNORMAL LOW (ref >60)
Glucose, Bld: 221 mg/dL — ABNORMAL HIGH (ref 70–99)
Potassium: 4.2 mmol/L (ref 3.5–5.1)
Sodium: 136 mmol/L (ref 135–145)

## 2019-01-29 LAB — GLUCOSE, CAPILLARY
Glucose-Capillary: 290 mg/dL — ABNORMAL HIGH (ref 70–99)
Glucose-Capillary: 323 mg/dL — ABNORMAL HIGH (ref 70–99)
Glucose-Capillary: 100 mg/dL — ABNORMAL HIGH (ref 70–99)
Glucose-Capillary: 88 mg/dL (ref 70–99)
Glucose-Capillary: 150 mg/dL — ABNORMAL HIGH (ref 70–99)

## 2019-01-29 MED ORDER — INSULIN ASPART PROT & ASPART (70-30 MIX) 100 UNIT/ML ~~LOC~~ SUSP
10.0000 [IU] | Freq: Every day | SUBCUTANEOUS | Status: DC
  Administered 2019-01-30: 09:00:00 10 [IU] via SUBCUTANEOUS

## 2019-01-29 MED ORDER — INSULIN ASPART PROT & ASPART (70-30 MIX) 100 UNIT/ML ~~LOC~~ SUSP
8.0000 [IU] | Freq: Every day | SUBCUTANEOUS | Status: DC
  Administered 2019-01-29: 8 [IU] via SUBCUTANEOUS

## 2019-01-29 NOTE — Discharge Summary (Addendum)
[name] [name] Discharge Summary  Patient name: [name] [name] Medical record number: [id number] Date of birth: 02-Mar-1946 Age: 73 y.o. Gender: male Date of Admission: 01/27/2019  Date of Discharge: 02/01/2019 Admitting Physician: [name], DO  Primary Care Provider: Azzie Glatter, [name] Consultants: None  Indication for Hospitalization: Fall, Hyperglycemia  Discharge Diagnoses/Problem List:  Fall Hyperglycemia hyperosmolar syndrome Uncontrolled type 2 diabetes mellitus with hyperglycemia on long term insulin CKDIII HTN History of stroke  Disposition: SNF  Discharge Condition: Stable and improved  Discharge Exam:  General: well nourished, well developed, in no acute distress with non-toxic appearance, sitting up comfortably in bed HEENT: normocephalic, atraumatic, moist mucous membranes CV: regular rate and rhythm without murmurs, rubs, or gallops, no lower extremity edema Lungs: clear to auscultation bilaterally with normal work of breathing Abdomen: soft, non-tender, non-distended, normoactive bowel sounds Skin: warm, dry, no rashes or lesions Extremities: warm and well perfused Neuro: Alert and oriented, speech difficult to understand 2/2 to dentention  Brief [name] Course:  [name] is a 73 y.o. male with past medical history significant for T2DM, HTN, and history of stroke, who presented with a glucose of 1197 and found to have hyperglycemic hyperosmolar syndrome likely from poor medication compliance. Initial concern for stroke however head CT was negative for any acute infarct, mass, or hemorrhage. Initial work up included A1C >15.5 and creatinine of 2.53 (baseline 1.4-1.8). Patient was given fluids with improvement in creatinine. He was initially started on insulin drip but transitioned to 70/30 SQ insulin (due to medication affordability) once stable. During admission, patient's sugar levels continued to improve with daily adjustments to  insulin regimen and he continued to receive diabetes education. Patient was discharged on 70/30 insulin: 20U qAM and 15U qHS.  Social Concerns: Patient is noted to live alone with intermittent family visits. During admission, there was concern for lack of capacity in regards to the severity of his medical conditions and how to appropriately treat them. Due to PT/OT recs, patient was discharged to SNF in order to receive continued diabetes management and supervised care.  Issues for Follow Up:  1. Ensure continued diabetes education  2. Recommended close blood sugar monitoring and diabetes management.  Significant Procedures: Insulin drip, CT head  Significant Labs and Imaging:  Recent Labs  Lab 01/27/19 1149 01/27/19 1412 01/28/19 1012  WBC 6.1  --  6.3  HGB 12.6* 13.3 13.8  HCT 44.8 39.0 40.7  PLT 218  --  170   Recent Labs  Lab 01/27/19 1549  01/28/19 1012 01/29/19 0350 01/30/19 0829 01/30/19 1018 01/31/19 0436 01/31/19 1224  NA  --    < > 138 136  --  123* 132* 128*  K  --    < > 4.1 4.2  --  5.1 4.3 4.7  CL  --    < > 108 106  --  97* 102 98  CO2  --    < > 19* 20*  --  12* 22 20*  GLUCOSE  --    < > 225* 221* 883* 817* 242* 667*  BUN  --    < > 25* 19  --  32* 29* 36*  CREATININE  --    < > 1.81* 1.51*  --  2.04* 1.64* 1.89*  CALCIUM  --    < > 8.4* 8.5*  --  8.1* 8.6* 8.6*  MG 2.5*  --   --   --   --   --   --   --    < > =  values in this interval not displayed.   Urinalysis    Component Value Date/Time   COLORURINE COLORLESS (A) 01/27/2019 1125   APPEARANCEUR CLEAR 01/27/2019 1125   LABSPEC 1.023 01/27/2019 1125   PHURINE 6.0 01/27/2019 1125   GLUCOSEU >=500 (A) 01/27/2019 1125   HGBUR SMALL (A) 01/27/2019 1125   BILIRUBINUR NEGATIVE 01/27/2019 1125   KETONESUR 5 (A) 01/27/2019 1125   PROTEINUR NEGATIVE 01/27/2019 1125   NITRITE NEGATIVE 01/27/2019 1125   LEUKOCYTESUR NEGATIVE 01/27/2019 1125   Urine Culture: negative Ethanol negative Troponin:  neg Cholesterol: Chol 294, Trig 380, HDL 66, LDL 152  Dg Chest 2 View  Result Date: 01/27/2019 CLINICAL DATA:  Hypothenar hammer syndrome EXAM: CHEST - 2 VIEW COMPARISON:  10/03/2018 FINDINGS: The heart size and mediastinal contours are within normal limits. Both lungs are clear. The visualized skeletal structures are unremarkable. IMPRESSION: No active cardiopulmonary disease. Electronically Signed   By: Franchot Gallo M.D.   On: 01/27/2019 16:39   Ct Head Wo Contrast  Result Date: 01/27/2019 CLINICAL DATA:  Pain following fall EXAM: CT HEAD WITHOUT CONTRAST TECHNIQUE: Contiguous axial images were obtained from the base of the skull through the vertex without intravenous contrast. COMPARISON:  October 02, 2018 FINDINGS: Brain: Mild to moderate diffuse atrophy is stable. There is no intracranial mass, hemorrhage, extra-axial fluid collection, or midline shift. There is small vessel disease in the centra semiovale bilaterally, stable. No acute infarct evident. Vascular: No hyperdense vessel. There is calcification in each distal vertebral artery and carotid siphon regions bilaterally. Skull: The bony calvarium appears intact. Sinuses/Orbits: There is an air-fluid level in the left maxillary antrum. There is opacification throughout much of the right sphenoid sinus. Orbits appear symmetric bilaterally. Other: Mastoid air cells are clear. There is debris in each external auditory canal. IMPRESSION: Atrophy with patchy periventricular small vessel disease. No acute infarct. No mass or hemorrhage. There are foci of arterial vascular calcification. Foci of paranasal sinus disease noted. There is probable cerumen in each external auditory canal. Electronically Signed   By: Lowella Grip III M.D.   On: 01/27/2019 12:56    Results/Tests Pending at Time of Discharge: None  Discharge Medications:  Allergies as of 02/01/2019   No Known Allergies     Medication List    STOP taking these medications    aspirin 81 MG EC tablet   insulin aspart 100 UNIT/ML FlexPen Commonly known as:  NOVOLOG     TAKE these medications   Accu-Chek Guide w/Device Kit 1 Device by Does not apply route QID.   amLODipine 10 MG tablet Commonly known as:  NORVASC Take 1 tablet (10 mg total) by mouth daily.   FLUoxetine 40 MG capsule Commonly known as:  PROZAC Take 40 mg by mouth daily.   glucose blood test strip Commonly known as:  True Metrix Blood Glucose Test CHECK BLOOD SUGAR UP TO 4 TIMES A DAY.   glucose blood test strip Commonly known as:  Accu-Chek Guide Use as instructed   insulin aspart protamine- aspart (70-30) 100 UNIT/ML injection Commonly known as:  NOVOLOG MIX 70/30 Inject 0.15 mLs (15 Units total) into the skin daily with supper. What changed:    how much to take  when to take this   insulin aspart protamine- aspart (70-30) 100 UNIT/ML injection Commonly known as:  NOVOLOG MIX 70/30 Inject 0.2 mLs (20 Units total) into the skin daily with breakfast. Start taking on:  February 02, 2019 What changed:  You were already taking  a medication with the same name, and this prescription was added. Make sure you understand how and when to take each.       Discharge Instructions: Please refer to Patient Instructions section of EMR for full details.  Patient was counseled important signs and symptoms that should prompt return to medical care, changes in medications, dietary instructions, activity restrictions, and follow up appointments.   Follow-Up Appointments:  Contact information for follow-up providers    Azzie Glatter, [name]. Schedule an appointment as soon as possible for a visit.   Specialty:  Family Medicine Why:  at earliest convienence for follow up appointment Contact information: [address] [place] [address] [phone]            Contact information for after-discharge care    Destination    [facility] Preferred SNF .   Service:  Skilled  Nursing Contact information: [place] [address] [place], [place], DO 02/01/2019, 11:39 AM PGY-1, [place]

## 2019-01-29 NOTE — Progress Notes (Signed)
Family Medicine Teaching Service Daily Progress Note Intern Pager: [phone]  Patient name: [name] [name] Medical record number: [id number] Date of birth: 1945-10-30 Age: 73 y.o. Gender: male  Primary Care Provider: Azzie Glatter, [name] Consultants: none Code Status: Full  Pt Overview and Major Events to Date:  4/16 admitted for [facility] 4/18 PT/OT rec SNF  Assessment and Plan: [name] [name] is a 73 y.o. male presenting with a fall and found to have HHS. PMH is significant for T2DM, HTN, history of stroke.  Poor Social Situation: Patient desired to leave AMA. Given reasons: "I have been at the hospital too long." Although patient A&Ox3 and able to answer all questions, some concern for lack of capacity. Limited understanding of what brought patient into hospital and how to properly treat diabetes. Upon discussion with patient, he insisted he has only been getting 1 insulin shot a day since he has been admitted in the hospital. He noted that his "daughter" called the doctor because his "sugar was too high or too low" and that is why he was admitted. However, when asked, he noted he only had two children (one daughter and one son) and his daughter recently passed away. When asked how his daughter called the doctor, he diverted the question. After further discussion with patient, he opted to stay an additional day for further medical care. Patient lives alone at home with occasional family supervision. PT/OT rec SNF.  - awaiting placement - Psych consult for further evaluation of capacity  T2DM, uncontrolled  Resolved HHS:  Uncontrolled with A1C >15.5.  Likely to poor understanding of disease state and inadequate home insulin use. PM 70/30 insulin increased to 8U overnight. CBG 150 at 2211 but 883 this AM after breakfast. Denied any abdominal pain, N/V, headache, vision changes. Anion gap WNL. -70/30 10U am and increase to 10U qHS - sSSI - DM education, appreciate assistance   CKD III. Stable. I/O's  appropriate. Cr increased from 1.51>2.04. - give 548mL NS bolus x 1 - continue to monitor AM BMP   HTN. BPs normotensive - will restart home norvasc on DC  FEN/GI: carb modified diet PPx: lovenox  Disposition: awaiting SNF placement  Subjective:  Patient feels well this AM. Just ate breakfast. Desires to leave AMA although still open to going to SNF, just ready to leave today.  Denies any headache, vision changes, CP, SOB, abdominal pain, N/V.   Objective: Temp:  [98.3 F (36.8 C)-99.1 F (37.3 C)] 98.3 F (36.8 C) (04/19 0531) Pulse Rate:  [61-78] 78 (04/19 0531) Resp:  [16-18] 18 (04/19 0531) BP: (103-159)/(77-88) 128/83 (04/19 0531) SpO2:  [94 %-100 %] 98 % (04/19 0531) Physical Exam: General: well nourished, well developed, in no acute distress with non-toxic appearance, sitting up comfortably in chair getting dressed HEENT: normocephalic, atraumatic, moist mucous membranes Neck: supple, non-tender without lymphadenopathy CV: regular rate and rhythm without murmurs, rubs, or gallops, no lower extremity edema Lungs: clear to auscultation bilaterally with normal work of breathing on RA, speaking in complete sentences Abdomen: soft, non-tender, non-distended, normoactive bowel sounds Skin: warm, dry Extremities: warm and well perfused, normal tone MSK: ROM grossly intact, strength intact, gait slow with some shuffling Neuro: Alert and oriented although does not appear to have understanding of medical diagnosis or treatment, speech difficult to understand 2/2 to dentention  Laboratory: Recent Labs  Lab 01/27/19 1149 01/27/19 1412 01/28/19 1012  WBC 6.1  --  6.3  HGB 12.6* 13.3 13.8  HCT 44.8 39.0 40.7  PLT 218  --  170   Recent Labs  Lab 01/28/19 1012 01/29/19 0350 01/30/19 0829 01/30/19 1018  NA 138 136  --  123*  K 4.1 4.2  --  5.1  CL 108 106  --  97*  CO2 19* 20*  --  12*  BUN 25* 19  --  32*  CREATININE 1.81* 1.51*  --  2.04*  CALCIUM 8.4* 8.5*  --   8.1*  GLUCOSE 225* 221* 883* 817*    Imaging/Diagnostic Tests: No results found.  Danna Hefty, DO 01/30/2019, 11:20 AM PGY-1, [place] Intern pager: [phone], text pages welcome

## 2019-01-29 NOTE — Progress Notes (Signed)
Family Medicine Teaching Service Daily Progress Note Intern Pager: [phone]  Patient name: [name] [name] Medical record number: [id number] Date of birth: 08/30/1946 Age: 73 y.o. Gender: male  Primary Care Provider: Azzie Glatter, [name] Consultants: none Code Status: Full  Pt Overview and Major Events to Date:  4/16 admitted for HHS  Assessment and Plan: [name] [name] is a 73 y.o. male presenting with a fall and found to have HHS. PMH is significant for T2DM, HTN, history of stroke.  T2DM, with resolved HHS. Uncontrolled with a1c >15.5. likely due to poor understanding of disease state and inadequate home insulin use.  -70/30 10U am and 5U pm - DM education, appreciate assistance  CKD III. Stable. Appears to be at baseline Cr 1.8 - monitor   HTN. BPs normotensive - will restart home norvasc on DC  Poor social situation.  Patient lives at home alone and has his sisters checking on him occasionally.  He was found down fortunately for only a short period of time prior to admission.PT/OT rec SNF.  -awaiting placement  FEN/GI: carb modified diet PPx: lovenox  Disposition: awaiting SNF placement  Subjective:  Pt feels well this am, eating breakfast. Initially declined SNF, when I voiced concerns with him being able to manage insulin at home and ending up readmitted, he thought SNF would be better and agreed with this plan.   Objective: Temp:  [98.4 F (36.9 C)-99 F (37.2 C)] 98.9 F (37.2 C) (04/18 0452) Pulse Rate:  [58-60] 59 (04/18 0452) Resp:  [18] 18 (04/17 1442) BP: (123-136)/(66-84) 136/79 (04/18 0452) SpO2:  [97 %-98 %] 98 % (04/18 0452) Weight:  [63.3 kg] 63.3 kg (04/18 0355) Physical Exam: General: laying in bed comfortably, in NAD Cardiovascular: RRR, no murmurs Respiratory: CTAB, NWOB on RA  Abdomen: soft, nontender, nondistended, + bowel sounds Extremities: WWP, no LE edema  Laboratory: Recent Labs  Lab 01/27/19 1149 01/27/19 1412 01/28/19 1012  WBC  6.1  --  6.3  HGB 12.6* 13.3 13.8  HCT 44.8 39.0 40.7  PLT 218  --  170   Recent Labs  Lab 01/28/19 0720 01/28/19 1012 01/29/19 0350  NA 136 138 136  K 4.3 4.1 4.2  CL 105 108 106  CO2 21* 19* 20*  BUN 24* 25* 19  CREATININE 1.79* 1.81* 1.51*  CALCIUM 8.4* 8.4* 8.5*  GLUCOSE 205* 225* 221*    Imaging/Diagnostic Tests: No results found.  [name] Hilding, MD 01/29/2019, 8:15 AM PGY-3, [place] Intern pager: [phone], text pages welcome

## 2019-01-29 NOTE — TOC Initial Note (Signed)
Transition of Care [facility]) - Initial/Assessment Note    Patient Details  Name: [name] [name] MRN: [id number] Date of Birth: [birth year]  [facility]) CM/SW Contact:    Glendale Chard, LCSW Phone Number: 01/29/2019, 12:55 PM  Clinical Narrative:                  Patient resides alone at home. CSW attemped to contact son and sister and demographics and left VM for callback to assist in discharge planning. CSW was able to confirm with patient on SNF rehab recommendations by PT/OT. Patient amenable to referrals being made and understands disposition. CSW will begin process in locating short-term rehab facilities for patient.   Expected Discharge Plan: Skilled Nursing Facility Barriers to Discharge: No Barriers Identified   Patient Goals and CMS Choice   CMS Medicare.gov Compare Post Acute Care list provided to:: Patient Choice offered to / list presented to : Patient  Expected Discharge Plan and Services Expected Discharge Plan: [place]       Living arrangements for the past 2 months: Apartment                          Prior Living Arrangements/Services Living arrangements for the past 2 months: Apartment Lives with:: Self Patient language and need for interpreter reviewed:: No Do you feel safe going back to the place where you live?: Yes      Need for Family Participation in Patient Care: No (Comment) Care giver support system in place?: No (comment)   Criminal Activity/Legal Involvement Pertinent to Current Situation/Hospitalization: No - Comment as needed  Activities of Daily Living Home Assistive Devices/Equipment: None ADL Screening (condition at time of admission) Patient's cognitive ability adequate to safely complete daily activities?: Yes Is the patient deaf or have difficulty hearing?: No Does the patient have difficulty seeing, even when wearing glasses/contacts?: No Does the patient have difficulty concentrating, remembering, or making  decisions?: No Patient able to express need for assistance with ADLs?: Yes Does the patient have difficulty dressing or bathing?: No Independently performs ADLs?: Yes (appropriate for developmental age) Does the patient have difficulty walking or climbing stairs?: Yes Weakness of Legs: Both Weakness of Arms/Hands: None  Permission Sought/Granted   Permission granted to share information with : Yes, Verbal Permission Granted              Emotional Assessment   Attitude/Demeanor/Rapport: Gracious Affect (typically observed): Accepting Orientation: : Oriented to Self, Oriented to Place, Oriented to Situation Alcohol / Substance Use: Not Applicable Psych Involvement: No (comment)  Admission diagnosis:  HHS (hypothenar hammer syndrome) ([place]) [I73.89] Diabetic ketoacidosis without coma associated with type 2 diabetes mellitus ([place]) [E11.10] Patient Active Problem List   Diagnosis Date Noted  . Fall 10/03/2018  . Uncontrolled type 2 diabetes mellitus with hyperglycemia, with long-term current use of insulin ([place]) 10/03/2018  . Chest wall contusion 10/03/2018  . Underweight 11/18/2017  . Normocytic anemia 11/18/2017  . DKA, type 2 ([place]) 08/01/2017  . History of cerebrovascular accident (CVA) in adulthood 07/25/2017  . HTN (hypertension) 07/25/2017  . Depression with anxiety 07/25/2017  . Left ventricular diastolic dysfunction 14/48/1856  . Dementia ([place]) 07/25/2017  . 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 Diabetes mellitus, insulin dependent (IDDM), controlled ([place]) 06/27/2017  . Hypertension 06/27/2017  . Hyperkalemia 01/16/2017  . Acute renal failure superimposed on stage 3 chronic kidney disease ([place])   . Noncompliance with medication regimen 08/17/2016  . History  of DVT (deep vein thrombosis) 07/06/2016  . Vitamin B 12 deficiency 06/18/2016  . Hyperglycemia 05/15/2016  . Diabetic hyperosmolar non-ketotic state ([place]) 05/15/2016  . Hyponatremia 05/15/2016  .  Anxiety 05/15/2016  . Narcotic dependency, continuous ([place]) 11/02/2015  . Uncontrolled type 2 diabetes mellitus with diabetic nephropathy, with long-term current use of insulin ([place]) 08/12/2015  . Depression 08/12/2015  . CKD (chronic kidney disease) stage 3, GFR 30-59 ml/min ([name]) 03/20/2014   PCP:  Azzie Glatter, [name] Pharmacy:   [facility], [address] [place] [address] Phone: [phone] Fax: [phone]     Social Determinants of Health (SDOH) Interventions    Readmission Risk Interventions No flowsheet data found.

## 2019-01-30 LAB — BASIC METABOLIC PANEL
Anion gap: 14 (ref 5–15)
BUN: 32 mg/dL — ABNORMAL HIGH (ref 8–23)
CO2: 12 mmol/L — ABNORMAL LOW (ref 22–32)
Calcium: 8.1 mg/dL — ABNORMAL LOW (ref 8.9–10.3)
Chloride: 97 mmol/L — ABNORMAL LOW (ref 98–111)
Creatinine, Ser: 2.04 mg/dL — ABNORMAL HIGH (ref 0.61–1.24)
GFR calc Af Amer: 36 mL/min — ABNORMAL LOW (ref >60)
GFR calc non Af Amer: 31 mL/min — ABNORMAL LOW (ref >60)
Glucose, Bld: 817 mg/dL (ref 70–99)
Potassium: 5.1 mmol/L (ref 3.5–5.1)
Sodium: 123 mmol/L — ABNORMAL LOW (ref 135–145)

## 2019-01-30 LAB — GLUCOSE, CAPILLARY
Glucose-Capillary: >600 mg/dL (ref 70–99)
Glucose-Capillary: >600 mg/dL (ref 70–99)
Glucose-Capillary: >600 mg/dL (ref 70–99)
Glucose-Capillary: 426 mg/dL — ABNORMAL HIGH (ref 70–99)
Glucose-Capillary: 264 mg/dL — ABNORMAL HIGH (ref 70–99)
Glucose-Capillary: 197 mg/dL — ABNORMAL HIGH (ref 70–99)

## 2019-01-30 LAB — GLUCOSE, RANDOM: Glucose, Bld: 883 mg/dL (ref 70–99)

## 2019-01-30 MED ORDER — ENOXAPARIN SODIUM 30 MG/0.3ML ~~LOC~~ SOLN
30.0000 mg | SUBCUTANEOUS | Status: DC
  Administered 2019-01-30: 30 mg via SUBCUTANEOUS
  Filled 2019-01-30: qty 0.3

## 2019-01-30 MED ORDER — INSULIN ASPART PROT & ASPART (70-30 MIX) 100 UNIT/ML ~~LOC~~ SUSP
10.0000 [IU] | Freq: Every day | SUBCUTANEOUS | Status: DC
  Administered 2019-01-31: 10 [IU] via SUBCUTANEOUS

## 2019-01-30 MED ORDER — INSULIN ASPART PROT & ASPART (70-30 MIX) 100 UNIT/ML ~~LOC~~ SUSP
10.0000 [IU] | Freq: Every day | SUBCUTANEOUS | Status: DC
  Administered 2019-01-30: 10 [IU] via SUBCUTANEOUS

## 2019-01-30 MED ORDER — SODIUM CHLORIDE 0.9 % IV BOLUS
500.0000 mL | Freq: Once | INTRAVENOUS | Status: AC
  Administered 2019-01-30: 500 mL via INTRAVENOUS

## 2019-01-30 NOTE — Progress Notes (Signed)
MD about patient wanting to leave AMA again. They say they are coming up to see patient and to involuntarily commit the patient. Social work notified. Sister [name] [name], called earlier to notify that patient is wanting to leave AMA.

## 2019-01-30 NOTE — Progress Notes (Signed)
Pt cbg was rechecked and showed 426. Paged provider on call. Awaiting further orders. Will continue to monitor pt. Per provider Daryll Drown, give pt his 70/30 & recheck cbg in 2 hrs.

## 2019-01-30 NOTE — Progress Notes (Signed)
No complain throughout the shift  Per pt responding well to treatment

## 2019-01-30 NOTE — Progress Notes (Signed)
MD paged about HI reading on CBG. Also informed about patient wanting to leave AMA. MD came to bedside and patient is still insisting on leaving AMA.

## 2019-01-30 NOTE — Progress Notes (Signed)
CSW was contacted to complete IVC paperwork due to patient wanting to leave AMA against medical advise. RN has now stated patient is compliant and will stay at medical center. IVC paperwork will not be needed at this current time. CSW acknowledged. Will continue to assist as needed.

## 2019-01-30 NOTE — Progress Notes (Signed)
Spoke with Dr. Tarry Kos regarding sliding scale coverage for glucose of 883. Will recheck CBG at 11:00 at given sliding scale based off of this result. Ordered to not give any coverage at this time.

## 2019-01-30 NOTE — Progress Notes (Signed)
MD paged about CBG reading saying critical Hi.

## 2019-01-30 NOTE — Progress Notes (Signed)
CRITICAL VALUE ALERT  Critical Value:  Glucose 883  Date & Time Notied: 01/30/19 0920  Provider Notified: Dr. Venia Minks  Orders Received/Actions taken: MD at bedside

## 2019-01-31 LAB — GLUCOSE, CAPILLARY
Glucose-Capillary: 371 mg/dL — ABNORMAL HIGH (ref 70–99)
Glucose-Capillary: >600 mg/dL (ref 70–99)
Glucose-Capillary: >600 mg/dL (ref 70–99)
Glucose-Capillary: 552 mg/dL (ref 70–99)
Glucose-Capillary: 318 mg/dL — ABNORMAL HIGH (ref 70–99)
Glucose-Capillary: 381 mg/dL — ABNORMAL HIGH (ref 70–99)
Glucose-Capillary: 151 mg/dL — ABNORMAL HIGH (ref 70–99)

## 2019-01-31 LAB — BASIC METABOLIC PANEL
Anion gap: 8 (ref 5–15)
Anion gap: 10 (ref 5–15)
BUN: 29 mg/dL — ABNORMAL HIGH (ref 8–23)
BUN: 36 mg/dL — ABNORMAL HIGH (ref 8–23)
CO2: 22 mmol/L (ref 22–32)
CO2: 20 mmol/L — ABNORMAL LOW (ref 22–32)
Calcium: 8.6 mg/dL — ABNORMAL LOW (ref 8.9–10.3)
Calcium: 8.6 mg/dL — ABNORMAL LOW (ref 8.9–10.3)
Chloride: 102 mmol/L (ref 98–111)
Chloride: 98 mmol/L (ref 98–111)
Creatinine, Ser: 1.64 mg/dL — ABNORMAL HIGH (ref 0.61–1.24)
Creatinine, Ser: 1.89 mg/dL — ABNORMAL HIGH (ref 0.61–1.24)
GFR calc Af Amer: 47 mL/min — ABNORMAL LOW (ref >60)
GFR calc Af Amer: 40 mL/min — ABNORMAL LOW (ref >60)
GFR calc non Af Amer: 41 mL/min — ABNORMAL LOW (ref >60)
GFR calc non Af Amer: 34 mL/min — ABNORMAL LOW (ref >60)
Glucose, Bld: 242 mg/dL — ABNORMAL HIGH (ref 70–99)
Glucose, Bld: 667 mg/dL (ref 70–99)
Potassium: 4.3 mmol/L (ref 3.5–5.1)
Potassium: 4.7 mmol/L (ref 3.5–5.1)
Sodium: 132 mmol/L — ABNORMAL LOW (ref 135–145)
Sodium: 128 mmol/L — ABNORMAL LOW (ref 135–145)

## 2019-01-31 MED ORDER — INSULIN ASPART PROT & ASPART (70-30 MIX) 100 UNIT/ML ~~LOC~~ SUSP: 15.0000 [IU] | Freq: Every day | SUBCUTANEOUS | Status: DC

## 2019-01-31 MED ORDER — INSULIN ASPART PROT & ASPART (70-30 MIX) 100 UNIT/ML ~~LOC~~ SUSP
18.0000 [IU] | Freq: Every day | SUBCUTANEOUS | Status: DC
  Administered 2019-02-01: 18 [IU] via SUBCUTANEOUS

## 2019-01-31 MED ORDER — INSULIN ASPART PROT & ASPART (70-30 MIX) 100 UNIT/ML ~~LOC~~ SUSP: 10.0000 [IU] | Freq: Two times a day (BID) | SUBCUTANEOUS | 1 refills | Status: DC

## 2019-01-31 MED ORDER — INSULIN ASPART PROT & ASPART (70-30 MIX) 100 UNIT/ML ~~LOC~~ SUSP: 10.0000 [IU] | Freq: Every day | SUBCUTANEOUS | Status: DC

## 2019-01-31 MED ORDER — ENOXAPARIN SODIUM 40 MG/0.4ML ~~LOC~~ SOLN
40.0000 mg | SUBCUTANEOUS | Status: DC
  Administered 2019-01-31: 40 mg via SUBCUTANEOUS
  Filled 2019-01-31: qty 0.4

## 2019-01-31 MED ORDER — INSULIN ASPART PROT & ASPART (70-30 MIX) 100 UNIT/ML ~~LOC~~ SUSP
10.0000 [IU] | Freq: Every day | SUBCUTANEOUS | Status: DC
  Administered 2019-01-31: 17:00:00 10 [IU] via SUBCUTANEOUS

## 2019-01-31 MED ORDER — INSULIN ASPART 100 UNIT/ML ~~LOC~~ SOLN
5.0000 [IU] | Freq: Once | SUBCUTANEOUS | Status: AC
  Administered 2019-01-31: 5 [IU] via SUBCUTANEOUS

## 2019-01-31 NOTE — Progress Notes (Signed)
Family Medicine Teaching Service Daily Progress Note Intern Pager: [phone]  Patient name: [name] [name] Medical record number: [id number] Date of birth: [birth year] Age: 73 y.o. Gender: male  Primary Care Provider: Azzie Glatter, [name] Consultants: none Code Status: Full  Pt Overview and Major Events to Date:  4/16 admitted for [facility] 4/18 PT/OT rec SNF  Assessment and Plan: [name] [name] is a 73 y.o. male presenting with a fall and found to have HHS. PMH is significant for T2DM, HTN, history of stroke.  T2DM, uncontrolled  Resolved HHS:  Uncontrolled with A1C >15.5.  Likely to poor understanding of disease state and inadequate home insulin use. Elevated blood sugars over the course of yesterday, PM 70/30 insulin increased to 10U overnight. CBG 200's overnight. Denied any abdominal pain, N/V, headache, vision changes.  - Continue 70/30 10U am and 10U qHS - sSSI - DM education, appreciate assistance   CKD III. Stable. I/O's appropriate. Cr improved s/p 0.5L bolus (1.51>2.04>1.64). - continue to monitor  HTN. BPs normotensive - will restart home norvasc on DC  Poor Social Situation: Patient more calm and relaxed today. Not desiring to leave AMA at this time. Patient lives alone at home with occasional family supervision. PT/OT rec SNF. - awaiting placement - Psych consult for further evaluation of capacity  FEN/GI: carb modified diet PPx: lovenox  Disposition: awaiting SNF placement  Subjective:  Patient feels well this AM. Calm and much better attitude. Denies any concerns or complaints this morning.   Objective: Temp:  [98.3 F (36.8 C)-99 F (37.2 C)] 98.5 F (36.9 C) (04/20 0523) Pulse Rate:  [58-76] 58 (04/20 0523) Resp:  [18] 18 (04/20 0523) BP: (130-162)/(87-92) 138/87 (04/20 0523) SpO2:  [97 %-100 %] 97 % (04/20 0523) Weight:  [64.1 kg] 64.1 kg (04/20 1607) Physical Exam: General: well nourished, well developed, in no acute distress with non-toxic appearance,  sitting up comfortably in bed HEENT: normocephalic, atraumatic, moist mucous membranes CV: regular rate and rhythm without murmurs, rubs, or gallops, no lower extremity edema Lungs: clear to auscultation bilaterally with normal work of breathing Abdomen: soft, non-tender, non-distended, normoactive bowel sounds Skin: warm, dry, no rashes or lesions Extremities: warm and well perfused Neuro: Alert and oriented, speech difficult to understand 2/2 to dentention  Laboratory: Recent Labs  Lab 01/27/19 1149 01/27/19 1412 01/28/19 1012  WBC 6.1  --  6.3  HGB 12.6* 13.3 13.8  HCT 44.8 39.0 40.7  PLT 218  --  170   Recent Labs  Lab 01/29/19 0350 01/30/19 0829 01/30/19 1018 01/31/19 0436  NA 136  --  123* 132*  K 4.2  --  5.1 4.3  CL 106  --  97* 102  CO2 20*  --  12* 22  BUN 19  --  32* 29*  CREATININE 1.51*  --  2.04* 1.64*  CALCIUM 8.5*  --  8.1* 8.6*  GLUCOSE 221* 883* 817* 242*    Imaging/Diagnostic Tests: No results found.  Mina Marble [place], DO 01/31/2019, 10:02 AM PGY-1, [place] Intern pager: [phone], text pages welcome

## 2019-01-31 NOTE — TOC Transition Note (Addendum)
Transition of Care [facility]) - CM/SW Discharge Note   Patient Details  Name: [name] [name] MRN: [id number] Date of Birth: [birth year]  Transition of Care [facility]) CM/SW Contact:  Gelene Mink, [place] Phone Number: 01/31/2019, 2:11 PM   Clinical Narrative:   Nurse to call report to [phone]. The patient will go to room 1206P.   **Please call patient's sister [name] [name] and inform her when he is leaving**  Nurse to call for PTAR once patient has stable blood sugars. Before 5:00pm call [phone], After 5:00pm call [phone] option 1, 3.     Final next level of care: Skilled Nursing Facility Barriers to Discharge: No Barriers Identified   Patient Goals and CMS Choice Patient states their goals for this hospitalization and ongoing recovery are:: Pt sister wants her brother to get better.  CMS Medicare.gov Compare Post Acute Care list provided to:: Patient Choice offered to / list presented to : Patient  Discharge Placement   Existing PASRR number confirmed : 01/31/19          Patient [facility] bed at: [facility] Patient to be transferred to facility by: [place] Name of family member notified: [name] [name] Patient and family notified of of transfer: 01/31/19  Discharge Plan and Services                DME Arranged: N/A DME Agency: NA HH Arranged: NA HH Agency: NA   Social Determinants of Health (SDOH) Interventions     Readmission Risk Interventions No flowsheet data found.

## 2019-01-31 NOTE — Progress Notes (Addendum)
Physical Therapy Treatment Patient Details Name: [name] [name] MRN: [id number] DOB: 09-Nov-1945 Today's Date: 01/31/2019    History of Present Illness Pt is a 73 y/o male admitted secondary to fall and HHS. PMH includes DM, CKD, HTN, dementia, CVA, and R hip arthroplasty.     PT Comments    Pt progressing towards goals. Continues to remain unsteady and required min A for steadying assist during ambulation. Pt also continues to present with cognitive deficits. Educated about seated HEP this session. Current recommendations appropriate. Will continue to follow acutely to maximize functional mobility independence and safety.    Follow Up Recommendations  SNF;Supervision/Assistance - 24 hour     Equipment Recommendations  None recommended by PT    Recommendations for Other Services       Precautions / Restrictions Precautions Precautions: Fall Restrictions Weight Bearing Restrictions: No    Mobility  Bed Mobility Overal bed mobility: Needs Assistance Bed Mobility: Supine to Sit;Sit to Supine     Supine to sit: Supervision Sit to supine: Supervision   General bed mobility comments: Supervision for safety.   Transfers Overall transfer level: Needs assistance Equipment used: None Transfers: Sit to/from Stand Sit to Stand: Min guard         General transfer comment: Min guard for steadying assist to stand. Cues to wait for PT to stand, as pt slightly impulsive.   Ambulation/Gait Ambulation/Gait assistance: Min assist Gait Distance (Feet): 100 Feet Assistive device: 1 person hand held assist Gait Pattern/deviations: Step-through pattern;Decreased stride length;Drifts right/left;Narrow base of support Gait velocity: Decreased    General Gait Details: Slightly improved balance, however, pt remained unsteady during gait. Required min A and HHA for steadying throughout. Pt with narrow BOS and tended to drift to L/R during ambulation. Pt continuing to refuse use of RW.     Stairs             Wheelchair Mobility    Modified Rankin (Stroke Patients Only)       Balance Overall balance assessment: Needs assistance Sitting-balance support: No upper extremity supported;Feet supported Sitting balance-Leahy Scale: Fair     Standing balance support: Single extremity supported Standing balance-Leahy Scale: Poor Standing balance comment: reliant on external of RW                            Cognition Arousal/Alertness: Awake/alert Behavior During Therapy: WFL for tasks assessed/performed Overall Cognitive Status: History of cognitive impairments - at baseline                                 General Comments: Per chart hx of dementia. Pt with poor awareness of safety and deficits.       Exercises General Exercises - Lower Extremity Long Arc Quad: AROM;Both;10 reps;Seated Hip Flexion/Marching: AROM;Both;10 reps;Seated    General Comments        Pertinent Vitals/Pain Pain Assessment: No/denies pain    Home Living                      Prior Function            PT Goals (current goals can now be found in the care plan section) Acute Rehab PT Goals Patient Stated Goal: to go home PT Goal Formulation: With patient Time For Goal Achievement: 02/11/19 Potential to Achieve Goals: Good Progress towards PT goals: Progressing toward goals  Frequency    Min 3X/week      PT Plan Current plan remains appropriate    Co-evaluation              AM-PAC PT "6 Clicks" Mobility   Outcome Measure  Help needed turning from your back to your side while in a flat bed without using bedrails?: None Help needed moving from lying on your back to sitting on the side of a flat bed without using bedrails?: A Little Help needed moving to and from a bed to a chair (including a wheelchair)?: A Little Help needed standing up from a chair using your arms (e.g., wheelchair or bedside chair)?: A Little Help  needed to walk in hospital room?: A Little Help needed climbing 3-5 steps with a railing? : A Lot 6 Click Score: 18    End of Session Equipment Utilized During Treatment: Gait belt Activity Tolerance: Patient tolerated treatment well Patient left: in bed;with call bell/phone within reach;with bed alarm set Nurse Communication: Mobility status PT Visit Diagnosis: Unsteadiness on feet (R26.81);History of falling (Z91.81);Muscle weakness (generalized) (M62.81)     Time: 5686-1683 PT Time Calculation (min) (ACUTE ONLY): 13 min  Charges:  $Gait Training: 8-22 mins                     Leighton Ruff, PT, DPT  Acute Rehabilitation Services  Pager: [phone] Office: [phone]    Rudean Hitt 01/31/2019, 1:11 PM

## 2019-01-31 NOTE — Progress Notes (Addendum)
Inpatient Diabetes Program Recommendations  AACE/ADA: New Consensus Statement on Inpatient Glycemic Control (2015)  Target Ranges:  Prepandial:   less than 140 mg/dL      Peak postprandial:   less than 180 mg/dL (1-2 hours)      Critically ill patients:  140 - 180 mg/dL   Lab Results  Component Value Date   GLUCAP 371 (H) 01/31/2019   HGBA1C >15.5 (H) 01/27/2019     Results for TYHIR, SCHWAN (MRN [id number]) as of 01/31/2019 11:16  Ref. Range 01/29/2019 22:11 01/30/2019 07:23 01/30/2019 07:28 01/30/2019 10:56 01/30/2019 14:43 01/30/2019 16:24 01/30/2019 20:19 01/31/2019 07:54  Glucose-Capillary Latest Ref Range: 70 - 99 mg/dL 150 (H) >600 (HH) >600 (HH) >600 (HH) 426 (H) 264 (H) 197 (H) 371 (H)     Review of Glycemic Control  Diabetes history: Type 2  Outpatient Diabetes medications: Novolog 70/30 12 units BID  Current orders for Inpatient glycemic control: Novolog 70/30 10 units BID                                                                           Novolog sensitive (0-9 units) tid    Patient admitted in [facility] 4/16 with a lab glucose of 1191. Had been better controlled ( 4/18 CBG was 150mg /dl at 2211) THEN the next am 4/19 lab glucose was >600. Labs on 4/19 am showed increase in several values from day prior 4/18 ( BUN increased 19 to 32/ Creatinine increased 1.51 to 2.04).  Today's  0400 lab values 242 mg/dl blood gluocse; BUN 29/ Creatinine 1.64.   CBG increased to 371 by 0800. Patient receiving the ordered 70/30 10 units BID and sensitive scale Novolog TID. Ate 75% of meal.   Note patient will most likely be discharged to a SNF.     MD please consider the following inpatient insulin adjustments:  1. Increase Novolog mix 70/30 insulin to 14 units BID  2. Add  Novolog (0-5) hs scale     UPDATE 1300 - CBG > 600 at lunch. Spoke to bedside RN and patient had 2 strawberry Ensure drinks and a coke before lunch. Will contact MD with above suggestions for increasing insulin today  and RN will discuss better drink choices with patient.   -- Will follow during hospitalization.--  Jonna Clark RN, MSN Diabetes Coordinator Inpatient Glycemic Control Team Team Pager: [phone] (8am-5pm)

## 2019-01-31 NOTE — NC FL2 (Signed)
[place] LEVEL OF CARE SCREENING TOOL     IDENTIFICATION  Patient Name: [name] [name] Birthdate: 1946/07/27 Sex: male Admission Date (Current Location): 01/27/2019  [facility] and [place] Number:  Herbalist and Address:  [facility], [place] [address] [place], [place] [address]      Provider Number: [id number]  Attending Physician Name and Address:  [name], MD  Relative Name and Phone Number:  Rafferty, [place], [phone]    Current Level of Care: Hospital Recommended Level of Care: [place] Prior Approval Number:    Date Approved/Denied:   PASRR Number: [id number]  Discharge Plan: SNF    Current Diagnoses: Patient Active Problem List   Diagnosis Date Noted  . Fall 10/03/2018  . Uncontrolled type 2 diabetes mellitus with hyperglycemia, with long-term current use of insulin ([place]) 10/03/2018  . Chest wall contusion 10/03/2018  . Underweight 11/18/2017  . Normocytic anemia 11/18/2017  . DKA, type 2 ([place]) 08/01/2017  . History of cerebrovascular accident (CVA) in adulthood 07/25/2017  . HTN (hypertension) 07/25/2017  . Depression with anxiety 07/25/2017  . Left ventricular diastolic dysfunction 41/42/3953  . Dementia ([place]) 07/25/2017  . 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 Diabetes mellitus, insulin dependent (IDDM), controlled ([place]) 06/27/2017  . Hypertension 06/27/2017  . Hyperkalemia 01/16/2017  . Weakness 11/16/2016  . Acute renal failure superimposed on stage 3 chronic kidney disease ([place])   . Noncompliance with medication regimen 08/17/2016  . History of DVT (deep vein thrombosis) 07/06/2016  . Vitamin B 12 deficiency 06/18/2016  . Hyperglycemia 05/15/2016  . Diabetic hyperosmolar non-ketotic state ([place]) 05/15/2016  . Hyponatremia 05/15/2016  . Anxiety 05/15/2016  . Narcotic dependency, continuous ([name]) 11/02/2015  . Uncontrolled type 2 diabetes mellitus with diabetic  nephropathy, with long-term current use of insulin ([place]) 08/12/2015  . Depression 08/12/2015  . CKD (chronic kidney disease) stage 3, GFR 30-59 ml/min ([name]) 03/20/2014    Orientation RESPIRATION BLADDER Height & Weight     Self, Time, Situation, Place  Normal Continent, External catheter Weight: 141 lb 5 oz (64.1 kg) Height:  5\' 10"  (177.8 cm)  BEHAVIORAL SYMPTOMS/MOOD NEUROLOGICAL BOWEL NUTRITION STATUS      Continent Diet(Carb mod, thin liquids)  AMBULATORY STATUS COMMUNICATION OF NEEDS Skin   Limited Assist   Skin abrasions(Dry skin, abrasion on leg)                       Personal Care Assistance Level of Assistance  Bathing, Feeding, Dressing, Total care Bathing Assistance: Limited assistance Feeding assistance: Independent Dressing Assistance: Limited assistance Total Care Assistance: Limited assistance   Functional Limitations Info  Sight, Hearing, Speech Sight Info: Adequate Hearing Info: Adequate Speech Info: Adequate    SPECIAL CARE FACTORS FREQUENCY  PT (By licensed PT), OT (By licensed OT)     PT Frequency: 5x/wk OT Frequency: 5x/wk            Contractures Contractures Info: Not present    Additional Factors Info  Code Status, Allergies, Insulin Sliding Scale Code Status Info: Full Code Allergies Info: No Known Allergies   Insulin Sliding Scale Info: insulin aspart novolog 0-9 units 3x daily w/meals & insulin aspart protamine (novolog 70/30 mix) 10 units daily with breakfast and dinner       Current Medications (01/31/2019):  This is the current hospital active medication list Current Facility-Administered Medications  Medication Dose Route Frequency Provider Last Rate Last Dose  .  acetaminophen (TYLENOL) tablet 650 mg  650 mg Oral Q6H PRN Lockamy, Timothy, DO       Or  . acetaminophen (TYLENOL) suppository 650 mg  650 mg Rectal Q6H PRN Lockamy, Timothy, DO      . enoxaparin (LOVENOX) injection 30 mg  30 mg Subcutaneous Q24H Pierce, Dwayne A,  RPH   30 mg at 01/30/19 1956  . insulin aspart (novoLOG) injection 0-9 Units  0-9 Units Subcutaneous TID WC Matilde Haymaker, MD   9 Units at 01/31/19 0848  . insulin aspart protamine- aspart (NOVOLOG MIX 70/30) injection 10 Units  10 Units Subcutaneous Q supper Rory Percy, DO   10 Units at 01/30/19 1756   And  . insulin aspart protamine- aspart (NOVOLOG MIX 70/30) injection 10 Units  10 Units Subcutaneous Q breakfast Rory Percy, DO   10 Units at 01/31/19 0849  . ondansetron (ZOFRAN) tablet 4 mg  4 mg Oral Q6H PRN Lockamy, Timothy, DO       Or  . ondansetron (ZOFRAN) injection 4 mg  4 mg Intravenous Q6H PRN [name], DO         Discharge Medications: Please see discharge summary for a list of discharge medications.  Relevant Imaging Results:  Relevant Lab Results:   Additional Information SSN: [id number]  [place], LCSWA

## 2019-02-01 DIAGNOSIS — Z9111 Patient's noncompliance with dietary regimen: Secondary | ICD-10-CM | POA: Diagnosis not present

## 2019-02-01 DIAGNOSIS — E1100 Type 2 diabetes mellitus with hyperosmolarity without nonketotic hyperglycemic-hyperosmolar coma (NKHHC): Secondary | ICD-10-CM | POA: Diagnosis not present

## 2019-02-01 DIAGNOSIS — Z743 Need for continuous supervision: Secondary | ICD-10-CM | POA: Diagnosis not present

## 2019-02-01 DIAGNOSIS — R269 Unspecified abnormalities of gait and mobility: Secondary | ICD-10-CM | POA: Diagnosis not present

## 2019-02-01 DIAGNOSIS — R279 Unspecified lack of coordination: Secondary | ICD-10-CM | POA: Diagnosis not present

## 2019-02-01 DIAGNOSIS — Z9181 History of falling: Secondary | ICD-10-CM | POA: Diagnosis not present

## 2019-02-01 DIAGNOSIS — R2681 Unsteadiness on feet: Secondary | ICD-10-CM | POA: Diagnosis not present

## 2019-02-01 DIAGNOSIS — R531 Weakness: Secondary | ICD-10-CM | POA: Diagnosis not present

## 2019-02-01 DIAGNOSIS — M6281 Muscle weakness (generalized): Secondary | ICD-10-CM | POA: Diagnosis not present

## 2019-02-01 DIAGNOSIS — R41841 Cognitive communication deficit: Secondary | ICD-10-CM | POA: Diagnosis not present

## 2019-02-01 DIAGNOSIS — F39 Unspecified mood [affective] disorder: Secondary | ICD-10-CM | POA: Diagnosis not present

## 2019-02-01 DIAGNOSIS — I10 Essential (primary) hypertension: Secondary | ICD-10-CM | POA: Diagnosis not present

## 2019-02-01 DIAGNOSIS — E1165 Type 2 diabetes mellitus with hyperglycemia: Secondary | ICD-10-CM | POA: Diagnosis not present

## 2019-02-01 DIAGNOSIS — I6389 Other cerebral infarction: Secondary | ICD-10-CM | POA: Diagnosis not present

## 2019-02-01 DIAGNOSIS — F3489 Other specified persistent mood disorders: Secondary | ICD-10-CM | POA: Diagnosis not present

## 2019-02-01 DIAGNOSIS — N178 Other acute kidney failure: Secondary | ICD-10-CM | POA: Diagnosis not present

## 2019-02-01 DIAGNOSIS — R5381 Other malaise: Secondary | ICD-10-CM | POA: Diagnosis not present

## 2019-02-01 DIAGNOSIS — B349 Viral infection, unspecified: Secondary | ICD-10-CM | POA: Diagnosis not present

## 2019-02-01 DIAGNOSIS — R4587 Impulsiveness: Secondary | ICD-10-CM | POA: Diagnosis not present

## 2019-02-01 DIAGNOSIS — R2689 Other abnormalities of gait and mobility: Secondary | ICD-10-CM | POA: Diagnosis not present

## 2019-02-01 DIAGNOSIS — I129 Hypertensive chronic kidney disease with stage 1 through stage 4 chronic kidney disease, or unspecified chronic kidney disease: Secondary | ICD-10-CM | POA: Diagnosis not present

## 2019-02-01 LAB — GLUCOSE, CAPILLARY
Glucose-Capillary: >600 mg/dL (ref 70–99)
Glucose-Capillary: 536 mg/dL (ref 70–99)
Glucose-Capillary: 362 mg/dL — ABNORMAL HIGH (ref 70–99)
Glucose-Capillary: 197 mg/dL — ABNORMAL HIGH (ref 70–99)

## 2019-02-01 MED ORDER — INSULIN ASPART PROT & ASPART (70-30 MIX) 100 UNIT/ML ~~LOC~~ SUSP: 20.0000 [IU] | Freq: Every day | SUBCUTANEOUS | 11 refills | Status: DC

## 2019-02-01 MED ORDER — INSULIN ASPART PROT & ASPART (70-30 MIX) 100 UNIT/ML ~~LOC~~ SUSP: 15.0000 [IU] | Freq: Every day | SUBCUTANEOUS | Status: DC

## 2019-02-01 MED ORDER — INSULIN ASPART PROT & ASPART (70-30 MIX) 100 UNIT/ML ~~LOC~~ SUSP: 20.0000 [IU] | Freq: Every day | SUBCUTANEOUS | Status: DC

## 2019-02-01 MED ORDER — INSULIN ASPART PROT & ASPART (70-30 MIX) 100 UNIT/ML ~~LOC~~ SUSP: 15.0000 [IU] | Freq: Every day | SUBCUTANEOUS | 11 refills | Status: DC

## 2019-02-01 NOTE — TOC Transition Note (Signed)
Transition of Care (TOC) - CM/SW Discharge Note 02/01/19 - DISCHARGED TO [place]   Patient Details  Name: [name] [name] MRN: [id number] Date of Birth: 1945/11/13  Transition of Care [facility]) CM/SW Contact:  Sable Feil, LCSW Phone Number: 02/01/2019, 2:28 PM   Clinical Narrative:  CSW advised that patient ready for discharge to [facility] today. Contacted facility admissions director Irine Seal and informed her of discharge, and d/c clinicals transmitted to facility. Sister Estiven Kohan [phone] contacted and advised of discharge.    Final next level of care: [place] [facility] Barriers to Discharge: No Barriers Identified   Patient Goals and CMS Choice Patient states their goals for this hospitalization and ongoing recovery are:: Pt sister wants her brother to get better.  CMS Medicare.gov Compare Post Acute Care list provided to:: Patient Choice offered to / list presented to : Patient  Discharge Placement   Existing PASRR number confirmed : 01/31/19          Patient chooses bed at: [facility] Patient to be transferred to facility by: [place] Name of family member notified: [name] Patient and family notified of of transfer: 01/31/19  Discharge Plan and Services - D/C to SNF for rehab                DME Arranged: N/A DME Agency: NA HH Arranged: NA HH Agency: NA   Social Determinants of Health ([name]) Interventions  No interventions needed at this time   Readmission Risk Interventions No flowsheet data found.

## 2019-02-01 NOTE — Care Management Important Message (Signed)
Important Message  Patient Details  Name: [name] [name] MRN: [id number] Date of Birth: 01/11/1946   Medicare Important Message Given:  Yes    Orbie Pyo 02/01/2019, 2:47 PM

## 2019-02-01 NOTE — Progress Notes (Signed)
Inpatient Diabetes Program Recommendations  AACE/ADA: New Consensus Statement on Inpatient Glycemic Control (2015)  Target Ranges:  Prepandial:   less than 140 mg/dL      Peak postprandial:   less than 180 mg/dL (1-2 hours)      Critically ill patients:  140 - 180 mg/dL   Lab Results  Component Value Date   GLUCAP 362 (H) 02/01/2019   HGBA1C >15.5 (H) 01/27/2019    Discussed discharge plan with Dr Tarry Kos based off of current inpatient glucose trends. Plan for SNF with goal to return home for independent care with injections. Hence, will continue with plan for 70/30 at discharge. Noted changes on discharge rec.   Thanks, Bronson Curb, MSN, RNC-OB Diabetes Coordinator [phone] (8a-5p)

## 2019-02-02 LAB — GLUCOSE, CAPILLARY
Glucose-Capillary: 186 mg/dL — ABNORMAL HIGH (ref 70–99)
Glucose-Capillary: 244 mg/dL — ABNORMAL HIGH (ref 70–99)
Glucose-Capillary: 191 mg/dL — ABNORMAL HIGH (ref 70–99)

## 2019-02-03 DIAGNOSIS — Z9111 Patient's noncompliance with dietary regimen: Secondary | ICD-10-CM | POA: Diagnosis not present

## 2019-02-03 DIAGNOSIS — E1165 Type 2 diabetes mellitus with hyperglycemia: Secondary | ICD-10-CM | POA: Diagnosis not present

## 2019-02-03 DIAGNOSIS — I6389 Other cerebral infarction: Secondary | ICD-10-CM | POA: Diagnosis not present

## 2019-02-03 DIAGNOSIS — N178 Other acute kidney failure: Secondary | ICD-10-CM | POA: Diagnosis not present

## 2019-02-03 DIAGNOSIS — F39 Unspecified mood [affective] disorder: Secondary | ICD-10-CM | POA: Diagnosis not present

## 2019-02-03 DIAGNOSIS — I129 Hypertensive chronic kidney disease with stage 1 through stage 4 chronic kidney disease, or unspecified chronic kidney disease: Secondary | ICD-10-CM | POA: Diagnosis not present

## 2019-02-14 DIAGNOSIS — I6389 Other cerebral infarction: Secondary | ICD-10-CM | POA: Diagnosis not present

## 2019-02-14 DIAGNOSIS — Z9111 Patient's noncompliance with dietary regimen: Secondary | ICD-10-CM | POA: Diagnosis not present

## 2019-02-14 DIAGNOSIS — F3489 Other specified persistent mood disorders: Secondary | ICD-10-CM | POA: Diagnosis not present

## 2019-02-14 DIAGNOSIS — I10 Essential (primary) hypertension: Secondary | ICD-10-CM | POA: Diagnosis not present

## 2019-02-14 DIAGNOSIS — I129 Hypertensive chronic kidney disease with stage 1 through stage 4 chronic kidney disease, or unspecified chronic kidney disease: Secondary | ICD-10-CM | POA: Diagnosis not present

## 2019-02-14 DIAGNOSIS — E1165 Type 2 diabetes mellitus with hyperglycemia: Secondary | ICD-10-CM | POA: Diagnosis not present

## 2019-02-14 DIAGNOSIS — N178 Other acute kidney failure: Secondary | ICD-10-CM | POA: Diagnosis not present

## 2019-02-17 DIAGNOSIS — E1165 Type 2 diabetes mellitus with hyperglycemia: Secondary | ICD-10-CM | POA: Diagnosis not present

## 2019-02-17 DIAGNOSIS — Z9111 Patient's noncompliance with dietary regimen: Secondary | ICD-10-CM | POA: Diagnosis not present

## 2019-02-17 DIAGNOSIS — E1122 Type 2 diabetes mellitus with diabetic chronic kidney disease: Secondary | ICD-10-CM | POA: Diagnosis not present

## 2019-02-17 DIAGNOSIS — I1310 Hypertensive heart and chronic kidney disease without heart failure, with stage 1 through stage 4 chronic kidney disease, or unspecified chronic kidney disease: Secondary | ICD-10-CM | POA: Diagnosis not present

## 2019-02-17 DIAGNOSIS — Z794 Long term (current) use of insulin: Secondary | ICD-10-CM | POA: Diagnosis not present

## 2019-02-17 DIAGNOSIS — N183 Chronic kidney disease, stage 3 (moderate): Secondary | ICD-10-CM | POA: Diagnosis not present

## 2019-02-17 DIAGNOSIS — F418 Other specified anxiety disorders: Secondary | ICD-10-CM | POA: Diagnosis not present

## 2019-02-17 DIAGNOSIS — Z9181 History of falling: Secondary | ICD-10-CM | POA: Diagnosis not present

## 2019-02-17 DIAGNOSIS — E1100 Type 2 diabetes mellitus with hyperosmolarity without nonketotic hyperglycemic-hyperosmolar coma (NKHHC): Secondary | ICD-10-CM | POA: Diagnosis not present

## 2019-02-17 DIAGNOSIS — Z8673 Personal history of transient ischemic attack (TIA), and cerebral infarction without residual deficits: Secondary | ICD-10-CM | POA: Diagnosis not present

## 2019-02-17 DIAGNOSIS — F0390 Unspecified dementia without behavioral disturbance: Secondary | ICD-10-CM | POA: Diagnosis not present

## 2019-02-23 DIAGNOSIS — E1122 Type 2 diabetes mellitus with diabetic chronic kidney disease: Secondary | ICD-10-CM | POA: Diagnosis not present

## 2019-02-23 DIAGNOSIS — I1310 Hypertensive heart and chronic kidney disease without heart failure, with stage 1 through stage 4 chronic kidney disease, or unspecified chronic kidney disease: Secondary | ICD-10-CM | POA: Diagnosis not present

## 2019-02-23 DIAGNOSIS — E1100 Type 2 diabetes mellitus with hyperosmolarity without nonketotic hyperglycemic-hyperosmolar coma (NKHHC): Secondary | ICD-10-CM | POA: Diagnosis not present

## 2019-02-23 DIAGNOSIS — E1165 Type 2 diabetes mellitus with hyperglycemia: Secondary | ICD-10-CM | POA: Diagnosis not present

## 2019-02-23 DIAGNOSIS — F0390 Unspecified dementia without behavioral disturbance: Secondary | ICD-10-CM | POA: Diagnosis not present

## 2019-02-23 DIAGNOSIS — N183 Chronic kidney disease, stage 3 (moderate): Secondary | ICD-10-CM | POA: Diagnosis not present

## 2019-02-24 ENCOUNTER — Telehealth: Payer: Self-pay

## 2019-02-24 DIAGNOSIS — F0390 Unspecified dementia without behavioral disturbance: Secondary | ICD-10-CM | POA: Diagnosis not present

## 2019-02-24 DIAGNOSIS — E1122 Type 2 diabetes mellitus with diabetic chronic kidney disease: Secondary | ICD-10-CM | POA: Diagnosis not present

## 2019-02-24 DIAGNOSIS — E1100 Type 2 diabetes mellitus with hyperosmolarity without nonketotic hyperglycemic-hyperosmolar coma (NKHHC): Secondary | ICD-10-CM | POA: Diagnosis not present

## 2019-02-24 DIAGNOSIS — E1165 Type 2 diabetes mellitus with hyperglycemia: Secondary | ICD-10-CM | POA: Diagnosis not present

## 2019-02-24 DIAGNOSIS — N183 Chronic kidney disease, stage 3 (moderate): Secondary | ICD-10-CM | POA: Diagnosis not present

## 2019-02-24 DIAGNOSIS — I1310 Hypertensive heart and chronic kidney disease without heart failure, with stage 1 through stage 4 chronic kidney disease, or unspecified chronic kidney disease: Secondary | ICD-10-CM | POA: Diagnosis not present

## 2019-02-24 NOTE — Telephone Encounter (Signed)
Shanon Brow was out to do physical therapy and states that patient blood sugar was 473. Also states that patient blood pressure was 100/60 sitting and 76/56 standing. Also states that patient doesn't remember if he has taking insulin or not. Patient sister was not there to confirm if patient is taking medication. Patient was advised to go to ED per Venora Maples. Shanon Brow will also let patient sister know that he needs to go to ED and also schedule a follow up with [place] in office

## 2019-02-25 DIAGNOSIS — N183 Chronic kidney disease, stage 3 (moderate): Secondary | ICD-10-CM | POA: Diagnosis not present

## 2019-02-25 DIAGNOSIS — E1165 Type 2 diabetes mellitus with hyperglycemia: Secondary | ICD-10-CM | POA: Diagnosis not present

## 2019-02-25 DIAGNOSIS — E1122 Type 2 diabetes mellitus with diabetic chronic kidney disease: Secondary | ICD-10-CM | POA: Diagnosis not present

## 2019-02-25 DIAGNOSIS — F0390 Unspecified dementia without behavioral disturbance: Secondary | ICD-10-CM | POA: Diagnosis not present

## 2019-02-25 DIAGNOSIS — E1100 Type 2 diabetes mellitus with hyperosmolarity without nonketotic hyperglycemic-hyperosmolar coma (NKHHC): Secondary | ICD-10-CM | POA: Diagnosis not present

## 2019-02-25 DIAGNOSIS — I1310 Hypertensive heart and chronic kidney disease without heart failure, with stage 1 through stage 4 chronic kidney disease, or unspecified chronic kidney disease: Secondary | ICD-10-CM | POA: Diagnosis not present

## 2019-03-01 DIAGNOSIS — E1165 Type 2 diabetes mellitus with hyperglycemia: Secondary | ICD-10-CM | POA: Diagnosis not present

## 2019-03-01 DIAGNOSIS — E1122 Type 2 diabetes mellitus with diabetic chronic kidney disease: Secondary | ICD-10-CM | POA: Diagnosis not present

## 2019-03-01 DIAGNOSIS — E1100 Type 2 diabetes mellitus with hyperosmolarity without nonketotic hyperglycemic-hyperosmolar coma (NKHHC): Secondary | ICD-10-CM | POA: Diagnosis not present

## 2019-03-01 DIAGNOSIS — F0390 Unspecified dementia without behavioral disturbance: Secondary | ICD-10-CM | POA: Diagnosis not present

## 2019-03-01 DIAGNOSIS — N183 Chronic kidney disease, stage 3 (moderate): Secondary | ICD-10-CM | POA: Diagnosis not present

## 2019-03-01 DIAGNOSIS — I1310 Hypertensive heart and chronic kidney disease without heart failure, with stage 1 through stage 4 chronic kidney disease, or unspecified chronic kidney disease: Secondary | ICD-10-CM | POA: Diagnosis not present

## 2019-03-02 DIAGNOSIS — I1310 Hypertensive heart and chronic kidney disease without heart failure, with stage 1 through stage 4 chronic kidney disease, or unspecified chronic kidney disease: Secondary | ICD-10-CM | POA: Diagnosis not present

## 2019-03-02 DIAGNOSIS — F0390 Unspecified dementia without behavioral disturbance: Secondary | ICD-10-CM | POA: Diagnosis not present

## 2019-03-02 DIAGNOSIS — E1122 Type 2 diabetes mellitus with diabetic chronic kidney disease: Secondary | ICD-10-CM | POA: Diagnosis not present

## 2019-03-02 DIAGNOSIS — E1100 Type 2 diabetes mellitus with hyperosmolarity without nonketotic hyperglycemic-hyperosmolar coma (NKHHC): Secondary | ICD-10-CM | POA: Diagnosis not present

## 2019-03-02 DIAGNOSIS — N183 Chronic kidney disease, stage 3 (moderate): Secondary | ICD-10-CM | POA: Diagnosis not present

## 2019-03-02 DIAGNOSIS — E1165 Type 2 diabetes mellitus with hyperglycemia: Secondary | ICD-10-CM | POA: Diagnosis not present

## 2019-03-03 DIAGNOSIS — N183 Chronic kidney disease, stage 3 (moderate): Secondary | ICD-10-CM | POA: Diagnosis not present

## 2019-03-03 DIAGNOSIS — E1100 Type 2 diabetes mellitus with hyperosmolarity without nonketotic hyperglycemic-hyperosmolar coma (NKHHC): Secondary | ICD-10-CM | POA: Diagnosis not present

## 2019-03-03 DIAGNOSIS — E1122 Type 2 diabetes mellitus with diabetic chronic kidney disease: Secondary | ICD-10-CM | POA: Diagnosis not present

## 2019-03-03 DIAGNOSIS — F0390 Unspecified dementia without behavioral disturbance: Secondary | ICD-10-CM | POA: Diagnosis not present

## 2019-03-03 DIAGNOSIS — E1165 Type 2 diabetes mellitus with hyperglycemia: Secondary | ICD-10-CM | POA: Diagnosis not present

## 2019-03-03 DIAGNOSIS — I1310 Hypertensive heart and chronic kidney disease without heart failure, with stage 1 through stage 4 chronic kidney disease, or unspecified chronic kidney disease: Secondary | ICD-10-CM | POA: Diagnosis not present

## 2019-03-04 ENCOUNTER — Telehealth: Payer: Self-pay

## 2019-03-04 NOTE — Telephone Encounter (Signed)
David with physical therapy states that patient missed his appointment today. They will and this visit on to the end of the treatment so that he will still get appointment

## 2019-03-05 DIAGNOSIS — I1310 Hypertensive heart and chronic kidney disease without heart failure, with stage 1 through stage 4 chronic kidney disease, or unspecified chronic kidney disease: Secondary | ICD-10-CM | POA: Diagnosis not present

## 2019-03-05 DIAGNOSIS — N183 Chronic kidney disease, stage 3 (moderate): Secondary | ICD-10-CM | POA: Diagnosis not present

## 2019-03-05 DIAGNOSIS — E1100 Type 2 diabetes mellitus with hyperosmolarity without nonketotic hyperglycemic-hyperosmolar coma (NKHHC): Secondary | ICD-10-CM | POA: Diagnosis not present

## 2019-03-05 DIAGNOSIS — E1165 Type 2 diabetes mellitus with hyperglycemia: Secondary | ICD-10-CM | POA: Diagnosis not present

## 2019-03-05 DIAGNOSIS — E1122 Type 2 diabetes mellitus with diabetic chronic kidney disease: Secondary | ICD-10-CM | POA: Diagnosis not present

## 2019-03-05 DIAGNOSIS — F0390 Unspecified dementia without behavioral disturbance: Secondary | ICD-10-CM | POA: Diagnosis not present

## 2019-03-08 ENCOUNTER — Telehealth: Payer: Self-pay

## 2019-03-08 DIAGNOSIS — N183 Chronic kidney disease, stage 3 (moderate): Secondary | ICD-10-CM | POA: Diagnosis not present

## 2019-03-08 DIAGNOSIS — E1165 Type 2 diabetes mellitus with hyperglycemia: Secondary | ICD-10-CM | POA: Diagnosis not present

## 2019-03-08 DIAGNOSIS — E1100 Type 2 diabetes mellitus with hyperosmolarity without nonketotic hyperglycemic-hyperosmolar coma (NKHHC): Secondary | ICD-10-CM | POA: Diagnosis not present

## 2019-03-08 DIAGNOSIS — F0390 Unspecified dementia without behavioral disturbance: Secondary | ICD-10-CM | POA: Diagnosis not present

## 2019-03-08 DIAGNOSIS — I1310 Hypertensive heart and chronic kidney disease without heart failure, with stage 1 through stage 4 chronic kidney disease, or unspecified chronic kidney disease: Secondary | ICD-10-CM | POA: Diagnosis not present

## 2019-03-08 DIAGNOSIS — E1122 Type 2 diabetes mellitus with diabetic chronic kidney disease: Secondary | ICD-10-CM | POA: Diagnosis not present

## 2019-03-08 NOTE — Telephone Encounter (Signed)
Patient was negative for the screening and will be at appointment tomorrow

## 2019-03-09 ENCOUNTER — Ambulatory Visit: Payer: Medicare Other | Admitting: Family Medicine

## 2019-03-09 ENCOUNTER — Telehealth: Payer: Self-pay | Admitting: Family Medicine

## 2019-03-09 DIAGNOSIS — F0390 Unspecified dementia without behavioral disturbance: Secondary | ICD-10-CM | POA: Diagnosis not present

## 2019-03-09 DIAGNOSIS — E1100 Type 2 diabetes mellitus with hyperosmolarity without nonketotic hyperglycemic-hyperosmolar coma (NKHHC): Secondary | ICD-10-CM | POA: Diagnosis not present

## 2019-03-09 DIAGNOSIS — I1310 Hypertensive heart and chronic kidney disease without heart failure, with stage 1 through stage 4 chronic kidney disease, or unspecified chronic kidney disease: Secondary | ICD-10-CM | POA: Diagnosis not present

## 2019-03-09 DIAGNOSIS — N183 Chronic kidney disease, stage 3 (moderate): Secondary | ICD-10-CM | POA: Diagnosis not present

## 2019-03-09 DIAGNOSIS — E1122 Type 2 diabetes mellitus with diabetic chronic kidney disease: Secondary | ICD-10-CM | POA: Diagnosis not present

## 2019-03-09 DIAGNOSIS — E1165 Type 2 diabetes mellitus with hyperglycemia: Secondary | ICD-10-CM | POA: Diagnosis not present

## 2019-03-10 NOTE — Telephone Encounter (Signed)
Left a vm for patient to callback 

## 2019-03-11 NOTE — Telephone Encounter (Signed)
Left another vm for them to callback regarding patient

## 2019-03-11 NOTE — Telephone Encounter (Signed)
FYI-Dee from [facility] will be d/c social work services and patient feels that he has gotten what he needs. They will be sending over paperwork.

## 2019-03-14 NOTE — Telephone Encounter (Signed)
The first entry was on 5/27 from [place] to [place] then Morey Hummingbird was documenting on the progression and it was not sent to you until Friday 5/29

## 2019-03-16 ENCOUNTER — Encounter: Payer: Self-pay | Admitting: Family Medicine

## 2019-03-16 ENCOUNTER — Ambulatory Visit (INDEPENDENT_AMBULATORY_CARE_PROVIDER_SITE_OTHER): Payer: Medicare Other | Admitting: Family Medicine

## 2019-03-16 ENCOUNTER — Other Ambulatory Visit: Payer: Self-pay

## 2019-03-16 VITALS — BP 118/66 | HR 64 | Temp 98.4°F | Ht 70.0 in | Wt 141.0 lb

## 2019-03-16 DIAGNOSIS — E1165 Type 2 diabetes mellitus with hyperglycemia: Secondary | ICD-10-CM | POA: Diagnosis not present

## 2019-03-16 DIAGNOSIS — Z23 Encounter for immunization: Secondary | ICD-10-CM | POA: Diagnosis not present

## 2019-03-16 DIAGNOSIS — R739 Hyperglycemia, unspecified: Secondary | ICD-10-CM | POA: Diagnosis not present

## 2019-03-16 DIAGNOSIS — I10 Essential (primary) hypertension: Secondary | ICD-10-CM | POA: Diagnosis not present

## 2019-03-16 DIAGNOSIS — Z794 Long term (current) use of insulin: Secondary | ICD-10-CM

## 2019-03-16 DIAGNOSIS — E119 Type 2 diabetes mellitus without complications: Secondary | ICD-10-CM

## 2019-03-16 DIAGNOSIS — E559 Vitamin D deficiency, unspecified: Secondary | ICD-10-CM

## 2019-03-16 DIAGNOSIS — Z09 Encounter for follow-up examination after completed treatment for conditions other than malignant neoplasm: Secondary | ICD-10-CM | POA: Diagnosis not present

## 2019-03-16 DIAGNOSIS — E538 Deficiency of other specified B group vitamins: Secondary | ICD-10-CM

## 2019-03-16 LAB — POCT URINALYSIS DIP (MANUAL ENTRY)
Bilirubin, UA: NEGATIVE
Blood, UA: NEGATIVE
Glucose, UA: 500 mg/dL — AB
Ketones, POC UA: NEGATIVE mg/dL
Leukocytes, UA: NEGATIVE
Nitrite, UA: NEGATIVE
Protein Ur, POC: 30 mg/dL — AB
Spec Grav, UA: 1.01 (ref 1.010–1.025)
Urobilinogen, UA: 0.2 E.U./dL
pH, UA: 6 (ref 5.0–8.0)

## 2019-03-16 LAB — GLUCOSE, POCT (MANUAL RESULT ENTRY): POC Glucose: 546 mg/dl — AB (ref 70–99)

## 2019-03-16 MED ORDER — INSULIN LISPRO 100 UNIT/ML ~~LOC~~ SOLN
20.0000 [IU] | Freq: Once | SUBCUTANEOUS | Status: AC
  Administered 2019-03-16: 20 [IU] via SUBCUTANEOUS

## 2019-03-16 MED ORDER — INSULIN LISPRO 100 UNIT/ML ~~LOC~~ SOLN: SUBCUTANEOUS | 3 refills | Status: DC

## 2019-03-16 MED ORDER — METFORMIN HCL 500 MG PO TABS: 500.0000 mg | ORAL_TABLET | Freq: Two times a day (BID) | ORAL | 3 refills | Status: DC

## 2019-03-16 MED ORDER — GLIPIZIDE 10 MG PO TABS: 10.0000 mg | ORAL_TABLET | Freq: Two times a day (BID) | ORAL | 3 refills | Status: DC

## 2019-03-16 MED ORDER — INSULIN GLARGINE 100 UNIT/ML SOLOSTAR PEN: 50.0000 [IU] | PEN_INJECTOR | Freq: Every day | SUBCUTANEOUS | 99 refills | Status: DC

## 2019-03-16 NOTE — Patient Instructions (Addendum)
Diabetes Mellitus and Nutrition, Adult When you have diabetes (diabetes mellitus), it is very important to have healthy eating habits because your blood sugar (glucose) levels are greatly affected by what you eat and drink. Eating healthy foods in the appropriate amounts, at about the same times every day, can help you:  Control your blood glucose.  Lower your risk of heart disease.  Improve your blood pressure.  Reach or maintain a healthy weight. Every person with diabetes is different, and each person has different needs for a meal plan. Your health care provider may recommend that you work with a diet and nutrition specialist (dietitian) to make a meal plan that is best for you. Your meal plan may vary depending on factors such as:  The calories you need.  The medicines you take.  Your weight.  Your blood glucose, blood pressure, and cholesterol levels.  Your activity level.  Other health conditions you have, such as heart or kidney disease. How do carbohydrates affect me? Carbohydrates, also called carbs, affect your blood glucose level more than any other type of food. Eating carbs naturally raises the amount of glucose in your blood. Carb counting is a method for keeping track of how many carbs you eat. Counting carbs is important to keep your blood glucose at a healthy level, especially if you use insulin or take certain oral diabetes medicines. It is important to know how many carbs you can safely have in each meal. This is different for every person. Your dietitian can help you calculate how many carbs you should have at each meal and for each snack. Foods that contain carbs include:  Bread, cereal, rice, pasta, and crackers.  Potatoes and corn.  Peas, beans, and lentils.  Milk and yogurt.  Fruit and juice.  Desserts, such as cakes, cookies, ice cream, and candy. How does alcohol affect me? Alcohol can cause a sudden decrease in blood glucose (hypoglycemia),  especially if you use insulin or take certain oral diabetes medicines. Hypoglycemia can be a life-threatening condition. Symptoms of hypoglycemia (sleepiness, dizziness, and confusion) are similar to symptoms of having too much alcohol. If your health care provider says that alcohol is safe for you, follow these guidelines:  Limit alcohol intake to no more than 1 drink per day for nonpregnant women and 2 drinks per day for men. One drink equals 12 oz of beer, 5 oz of wine, or 1 oz of hard liquor.  Do not drink on an empty stomach.  Keep yourself hydrated with water, diet soda, or unsweetened iced tea.  Keep in mind that regular soda, juice, and other mixers may contain a lot of sugar and must be counted as carbs. What are tips for following this plan?  Reading food labels  Start by checking the serving size on the "Nutrition Facts" label of packaged foods and drinks. The amount of calories, carbs, fats, and other nutrients listed on the label is based on one serving of the item. Many items contain more than one serving per package.  Check the total grams (g) of carbs in one serving. You can calculate the number of servings of carbs in one serving by dividing the total carbs by 15. For example, if a food has 30 g of total carbs, it would be equal to 2 servings of carbs.  Check the number of grams (g) of saturated and trans fats in one serving. Choose foods that have low or no amount of these fats.  Check the number of  milligrams (mg) of salt (sodium) in one serving. Most people should limit total sodium intake to less than 2,300 mg per day.  Always check the nutrition information of foods labeled as "low-fat" or "nonfat". These foods may be higher in added sugar or refined carbs and should be avoided.  Talk to your dietitian to identify your daily goals for nutrients listed on the label. Shopping  Avoid buying canned, premade, or processed foods. These foods tend to be high in fat, sodium,  and added sugar.  Shop around the outside edge of the grocery store. This includes fresh fruits and vegetables, bulk grains, fresh meats, and fresh dairy. Cooking  Use low-heat cooking methods, such as baking, instead of high-heat cooking methods like deep frying.  Cook using healthy oils, such as olive, canola, or sunflower oil.  Avoid cooking with butter, cream, or high-fat meats. Meal planning  Eat meals and snacks regularly, preferably at the same times every day. Avoid going long periods of time without eating.  Eat foods high in fiber, such as fresh fruits, vegetables, beans, and whole grains. Talk to your dietitian about how many servings of carbs you can eat at each meal.  Eat 4-6 ounces (oz) of lean protein each day, such as lean meat, chicken, fish, eggs, or tofu. One oz of lean protein is equal to: ? 1 oz of meat, chicken, or fish. ? 1 egg. ?  cup of tofu.  Eat some foods each day that contain healthy fats, such as avocado, nuts, seeds, and fish. Lifestyle  Check your blood glucose regularly.  Exercise regularly as told by your health care provider. This may include: ? 150 minutes of moderate-intensity or vigorous-intensity exercise each week. This could be brisk walking, biking, or water aerobics. ? Stretching and doing strength exercises, such as yoga or weightlifting, at least 2 times a week.  Take medicines as told by your health care provider.  Do not use any products that contain nicotine or tobacco, such as cigarettes and e-cigarettes. If you need help quitting, ask your health care provider.  Work with a Social worker or diabetes educator to identify strategies to manage stress and any emotional and social challenges. Questions to ask a health care provider  Do I need to meet with a diabetes educator?  Do I need to meet with a dietitian?  What number can I call if I have questions?  When are the best times to check my blood glucose? Where to find more  information:  American Diabetes Association: diabetes.org  Academy of Nutrition and Dietetics: www.eatright.[employer] of Diabetes and Digestive and Kidney Diseases (NIH): [employer] Summary  A healthy meal plan will help you control your blood glucose and maintain a healthy lifestyle.  Working with a diet and nutrition specialist (dietitian) can help you make a meal plan that is best for you.  Keep in mind that carbohydrates (carbs) and alcohol have immediate effects on your blood glucose levels. It is important to count carbs and to use alcohol carefully. This information is not intended to replace advice given to you by your health care provider. Make sure you discuss any questions you have with your health care provider. Document Released: 06/26/2005 Document Revised: 04/29/2017 Document Reviewed: 11/03/2016 Elsevier Interactive Patient Education  2019 [place]. Diabetes Basics  Diabetes (diabetes mellitus) is a long-term (chronic) disease. It occurs when the body does not properly use sugar (glucose) that is released from food after you eat. Diabetes may be caused by  one or both of these problems:  Your pancreas does not make enough of a hormone called insulin.  Your body does not react in a normal way to insulin that it makes. Insulin lets sugars (glucose) go into cells in your body. This gives you energy. If you have diabetes, sugars cannot get into cells. This causes high blood sugar (hyperglycemia). Follow these instructions at home: How is diabetes treated? You may need to take insulin or other diabetes medicines daily to keep your blood sugar in balance. Take your diabetes medicines every day as told by your doctor. List your diabetes medicines here: Diabetes medicines  Name of medicine: ______________________________ ? Amount (dose): _______________ Time (a.m./p.m.): _______________ Notes: ___________________________________  Name of medicine:  ______________________________ ? Amount (dose): _______________ Time (a.m./p.m.): _______________ Notes: ___________________________________  Name of medicine: ______________________________ ? Amount (dose): _______________ Time (a.m./p.m.): _______________ Notes: ___________________________________ If you use insulin, you will learn how to give yourself insulin by injection. You may need to adjust the amount based on the food that you eat. List the types of insulin you use here: Insulin  Insulin type: ______________________________ ? Amount (dose): _______________ Time (a.m./p.m.): _______________ Notes: ___________________________________  Insulin type: ______________________________ ? Amount (dose): _______________ Time (a.m./p.m.): _______________ Notes: ___________________________________  Insulin type: ______________________________ ? Amount (dose): _______________ Time (a.m./p.m.): _______________ Notes: ___________________________________  Insulin type: ______________________________ ? Amount (dose): _______________ Time (a.m./p.m.): _______________ Notes: ___________________________________  Insulin type: ______________________________ ? Amount (dose): _______________ Time (a.m./p.m.): _______________ Notes: ___________________________________ How do I manage my blood sugar?  Check your blood sugar levels using a blood glucose monitor as directed by your doctor. Your doctor will set treatment goals for you. Generally, you should have these blood sugar levels:  Before meals (preprandial): 80-130 mg/dL (4.4-7.2 mmol/L).  After meals (postprandial): below 180 mg/dL (10 mmol/L).  A1c level: less than 7%. Write down the times that you will check your blood sugar levels: Blood sugar checks  Time: _______________ Notes: ___________________________________  Time: _______________ Notes: ___________________________________  Time: _______________ Notes:  ___________________________________  Time: _______________ Notes: ___________________________________  Time: _______________ Notes: ___________________________________  Time: _______________ Notes: ___________________________________  What do I need to know about low blood sugar? Low blood sugar is called hypoglycemia. This is when blood sugar is at or below 70 mg/dL (3.9 mmol/L). Symptoms may include:  Feeling: ? Hungry. ? Worried or nervous (anxious). ? Sweaty and clammy. ? Confused. ? Dizzy. ? Sleepy. ? Sick to your stomach (nauseous).  Having: ? A fast heartbeat. ? A headache. ? A change in your vision. ? Tingling or no feeling (numbness) around the mouth, lips, or tongue. ? Jerky movements that you cannot control (seizure).  Having trouble with: ? Moving (coordination). ? Sleeping. ? Passing out (fainting). ? Getting upset easily (irritability). Treating low blood sugar To treat low blood sugar, eat or drink something sugary right away. If you can think clearly and swallow safely, follow the 15:15 rule:  Take 15 grams of a fast-acting carb (carbohydrate). Talk with your doctor about how much you should take.  Some fast-acting carbs are: ? Sugar tablets (glucose pills). Take 3-4 glucose pills. ? 6-8 pieces of hard candy. ? 4-6 oz (120-150 mL) of fruit juice. ? 4-6 oz (120-150 mL) of regular (not diet) soda. ? 1 Tbsp (15 mL) honey or sugar.  Check your blood sugar 15 minutes after you take the carb.  If your blood sugar is still at or below 70 mg/dL (3.9 mmol/L), take 15 grams of a carb again.  If your  blood sugar does not go above 70 mg/dL (3.9 mmol/L) after 3 tries, get help right away.  After your blood sugar goes back to normal, eat a meal or a snack within 1 hour. Treating very low blood sugar If your blood sugar is at or below 54 mg/dL (3 mmol/L), you have very low blood sugar (severe hypoglycemia). This is an emergency. Do not wait to see if the symptoms  will go away. Get medical help right away. Call your local emergency services (911 in the [place] Do not drive yourself to the hospital. Questions to ask your health care provider  Do I need to meet with a diabetes educator?  What equipment will I need to care for myself at home?  What diabetes medicines do I need? When should I take them?  How often do I need to check my blood sugar?  What number can I call if I have questions?  When is my next doctor's visit?  Where can I find a support group for people with diabetes? Where to find more information  American Diabetes Association: www.diabetes.org  American Association of Diabetes Educators: www.diabeteseducator.org/patient-resources Contact a doctor if:  Your blood sugar is at or above 240 mg/dL (13.3 mmol/L) for 2 days in a row.  You have been sick or have had a fever for 2 days or more, and you are not getting better.  You have any of these problems for more than 6 hours: ? You cannot eat or drink. ? You feel sick to your stomach (nauseous). ? You throw up (vomit). ? You have watery poop (diarrhea). Get help right away if:  Your blood sugar is lower than 54 mg/dL (3 mmol/L).  You get confused.  You have trouble: ? Thinking clearly. ? Breathing. Summary  Diabetes (diabetes mellitus) is a long-term (chronic) disease. It occurs when the body does not properly use sugar (glucose) that is released from food after digestion.  Take insulin and diabetes medicines as told.  Check your blood sugar every day, as often as told.  Keep all follow-up visits as told by your doctor. This is important. This information is not intended to replace advice given to you by your health care provider. Make sure you discuss any questions you have with your health care provider. Document Released: 01/01/2018 Document Revised: 03/22/2018 Document Reviewed: 01/01/2018 Elsevier Interactive Patient Education  2019 [place]. Insulin  Glargine injection What is this medicine? INSULIN GLARGINE (IN su lin GLAR geen) is a human-made form of insulin. This drug lowers the amount of sugar in your blood. It is a long-acting insulin that is usually given once a day. This medicine may be used for other purposes; ask your health care provider or pharmacist if you have questions. COMMON BRAND NAME(S): BASAGLAR, Lantus, Lantus SoloStar, Toujeo Max SoloStar, Toujeo SoloStar What should I tell my health care provider before I take this medicine? They need to know if you have any of these conditions: -episodes of low blood sugar -eye disease, vision problems -kidney disease -liver disease -an unusual or allergic reaction to insulin, metacresol, other medicines, foods, dyes, or preservatives -pregnant or trying to get pregnant -breast-feeding How should I use this medicine? This medicine is for injection under the skin. Use this medicine at the same time each day. Use exactly as directed. This insulin should never be mixed in the same syringe with other insulins before injection. Do not vigorously shake before use. You will be taught how to use this medicine and  how to adjust doses for activities and illness. Do not use more insulin than prescribed. Always check the appearance of your insulin before using it. This medicine should be clear and colorless like water. Do not use it if it is cloudy, thickened, colored, or has solid particles in it. If you use an insulin pen, be sure to take off the outer needle cover before using the dose. It is important that you put your used needles and syringes in a special sharps container. Do not put them in a trash can. If you do not have a sharps container, call your pharmacist or healthcare provider to get one. Talk to your pediatrician regarding the use of this medicine in children. Special care may be needed. Overdosage: If you think you have taken too much of this medicine contact a poison control  center or emergency room at once. NOTE: This medicine is only for you. Do not share this medicine with others. What if I miss a dose? It is important not to miss a dose. Your health care professional or doctor should discuss a plan for missed doses with you. If you do miss a dose, follow their plan. Do not take double doses. What may interact with this medicine? -other medicines for diabetes Many medications may cause changes in blood sugar, these include: -alcohol containing beverages -antiviral medicines for HIV or AIDS -aspirin and aspirin-like drugs -certain medicines for blood pressure, heart disease, irregular heart beat -chromium -diuretics -male hormones, such as estrogens or progestins, birth control pills -fenofibrate -gemfibrozil -isoniazid -lanreotide -male hormones or anabolic steroids -MAOIs like Carbex, Eldepryl, Marplan, Nardil, and Parnate -medicines for weight loss -medicines for allergies, asthma, cold, or cough -medicines for depression, anxiety, or psychotic disturbances -niacin -nicotine -NSAIDs, medicines for pain and inflammation, like ibuprofen or naproxen -octreotide -pasireotide -pentamidine -phenytoin -probenecid -quinolone antibiotics such as ciprofloxacin, levofloxacin, ofloxacin -some herbal dietary supplements -steroid medicines such as prednisone or cortisone -sulfamethoxazole; trimethoprim -thyroid hormones Some medications can hide the warning symptoms of low blood sugar (hypoglycemia). You may need to monitor your blood sugar more closely if you are taking one of these medications. These include: -beta-blockers, often used for high blood pressure or heart problems (examples include atenolol, metoprolol, propranolol) -clonidine -guanethidine -reserpine This list may not describe all possible interactions. Give your health care provider a list of all the medicines, herbs, non-prescription drugs, or dietary supplements you use. Also tell  them if you smoke, drink alcohol, or use illegal drugs. Some items may interact with your medicine. What should I watch for while using this medicine? Visit your health care professional or doctor for regular checks on your progress. Do not drive, use machinery, or do anything that needs mental alertness until you know how this medicine affects you. Alcohol may interfere with the effect of this medicine. Avoid alcoholic drinks. A test called the HbA1C (A1C) will be monitored. This is a simple blood test. It measures your blood sugar control over the last 2 to 3 months. You will receive this test every 3 to 6 months. Learn how to check your blood sugar. Learn the symptoms of low and high blood sugar and how to manage them. Always carry a quick-source of sugar with you in case you have symptoms of low blood sugar. Examples include hard sugar candy or glucose tablets. Make sure others know that you can choke if you eat or drink when you develop serious symptoms of low blood sugar, such as seizures or unconsciousness. They must  get medical help at once. Tell your doctor or health care professional if you have high blood sugar. You might need to change the dose of your medicine. If you are sick or exercising more than usual, you might need to change the dose of your medicine. Do not skip meals. Ask your doctor or health care professional if you should avoid alcohol. Many nonprescription cough and cold products contain sugar or alcohol. These can affect blood sugar. Make sure that you have the right kind of syringe for the type of insulin you use. Try not to change the brand and type of insulin or syringe unless your health care professional or doctor tells you to. Switching insulin brand or type can cause dangerously high or low blood sugar. Always keep an extra supply of insulin, syringes, and needles on hand. Use a syringe one time only. Throw away syringe and needle in a closed container to prevent accidental  needle sticks. Insulin pens and cartridges should never be shared. Even if the needle is changed, sharing may result in passing of viruses like hepatitis or HIV. Each time you get a new box of pen needles, check to see if they are the same type as the ones you were trained to use. If not, ask your health care professional to show you how to use this new type properly. Wear a medical ID bracelet or chain, and carry a card that describes your disease and details of your medicine and dosage times. What side effects may I notice from receiving this medicine? Side effects that you should report to your doctor or health care professional as soon as possible: -allergic reactions like skin rash, itching or hives, swelling of the face, lips, or tongue -breathing problems -signs and symptoms of high blood sugar such as dizziness, dry mouth, dry skin, fruity breath, nausea, stomach pain, increased hunger or thirst, increased urination -signs and symptoms of low blood sugar such as feeling anxious, confusion, dizziness, increased hunger, unusually weak or tired, sweating, shakiness, cold, irritable, headache, blurred vision, fast heartbeat, loss of consciousness Side effects that usually do not require medical attention (report to your doctor or health care professional if they continue or are bothersome): -increase or decrease in fatty tissue under the skin due to overuse of a particular injection site -itching, burning, swelling, or rash at site where injected This list may not describe all possible side effects. Call your doctor for medical advice about side effects. You may report side effects to FDA at 1-800-FDA-1088. Where should I keep my medicine? Keep out of the reach of children. Store unopened vials in a refrigerator between 2 and 8 degrees C (36 and 46 degrees F). Do not freeze or use if the insulin has been frozen. Opened vials (vials currently in use) may be stored in the refrigerator or at room  temperature, at approximately 25 degrees C (77 degrees F) or cooler. Keeping your insulin at room temperature decreases the amount of pain during injection. Once opened, your insulin can be used for 28 days. After 28 days, the vial should be thrown away. Store Lantus [employer], mining in a refrigerator between 2 and 8 degrees C (36 and 46 degrees F) or at room temperature below 30 degrees C (86 degrees F). Do not freeze or use if the insulin has been frozen. Once opened, the pens should be kept at room temperature. Do not store in the refrigerator once opened. Once opened, the insulin can be used for  28 days. After 28 days, the Lantus Solostar Pen or Basaglar KwikPen should be thrown away. Store Toujeo and [employer] in a refrigerator between 2 and 8 degrees C (36 and 46 degrees F). Do not freeze or use if the insulin has been frozen. Once opened, the pens should be kept at room temperature below 30 degrees C (86 degrees F). Do not store in the refrigerator once opened. Once opened, the insulin can be used for 56 days. After 56 days, the Toujeo or [employer] Pen should be thrown away. Protect from light and excessive heat. Throw away any unused medicine after the expiration date or after the specified time for room temperature storage has passed. NOTE: This sheet is a summary. It may not cover all possible information. If you have questions about this medicine, talk to your doctor, pharmacist, or health care provider.  2019 Elsevier/Gold Standard (2018-04-01 15:06:27) Insulin Lispro injection What is this medicine? INSULIN LISPRO (IN su lin LYE sproe) is a human-made form of insulin. This drug lowers the amount of sugar in your blood. This medicine is a rapid-acting insulin that starts working faster than regular insulin. It will not work as long as regular insulin. This medicine may be used for other purposes; ask your health care provider or pharmacist if you have  questions. COMMON BRAND NAME(S): Admelog, Admelog SoloStar, Humalog, Humalog Junior KwikPen, Humalog KwikPen What should I tell my health care provider before I take this medicine? They need to know if you have any of these conditions: -episodes of low blood sugar -eye disease, vision problems -kidney disease -liver disease -an unusual or allergic reaction to insulin, metacresol, other medicines, foods, dyes, or preservatives -pregnant or trying to get pregnant -breast-feeding How should I use this medicine? This medicine is for injection under the skin or infusion into a vein. This medicine may be given by health care professional in a hospital or clinic setting. It is important to follow the directions given to you by your health care professional or doctor. You should inject this medicine within 15 minutes before or after your meal. Have food ready before injection. Do not delay eating. You will be taught how to use this medicine and how to adjust doses for activities and illness. Do not use more insulin than prescribed. Do not use more or less often than prescribed. Always check the appearance of your insulin before using it. This medicine should be clear and colorless like water. Do not use it if it is cloudy, thickened, colored, or has solid particles in it. If you use a pen, be sure to take off the outer needle cover before using the dose. It is important that you put your used needles and syringes in a special sharps container. Do not put them in a trash can. If you do not have a sharps container, call your pharmacist or healthcare provider to get one. Talk to your pediatrician regarding the use of this medicine in children. Special care may be needed. Overdosage: If you think you have taken too much of this medicine contact a poison control center or emergency room at once. NOTE: This medicine is only for you. Do not share this medicine with others. What if I miss a dose? It is important  not to miss a dose. Your health care professional or doctor should discuss a plan for missed doses with you. If you do miss a dose, follow their plan. Do not take double doses. What may  interact with this medicine? -other medicines for diabetes Many medications may cause changes in blood sugar, these include: -alcohol containing beverages -antiviral medicines for HIV or AIDS -aspirin and aspirin-like drugs -certain medicines for blood pressure, heart disease, irregular heart beat -chromium -diuretics -male hormones, such as estrogens or progestins, birth control pills -fenofibrate -gemfibrozil -isoniazid -lanreotide -male hormones or anabolic steroids -MAOIs like Carbex, Eldepryl, Marplan, Nardil, and Parnate -medicines for weight loss -medicines for allergies, asthma, cold, or cough -medicines for depression, anxiety, or psychotic disturbances -niacin -nicotine -NSAIDs, medicines for pain and inflammation, like ibuprofen or naproxen -octreotide -pasireotide -pentamidine -phenytoin -probenecid -quinolone antibiotics such as ciprofloxacin, levofloxacin, ofloxacin -some herbal dietary supplements -steroid medicines such as prednisone or cortisone -sulfamethoxazole; trimethoprim -thyroid hormones Some medications can hide the warning symptoms of low blood sugar (hypoglycemia). You may need to monitor your blood sugar more closely if you are taking one of these medications. These include: -beta-blockers, often used for high blood pressure or heart problems (examples include atenolol, metoprolol, propranolol) -clonidine -guanethidine -reserpine This list may not describe all possible interactions. Give your health care provider a list of all the medicines, herbs, non-prescription drugs, or dietary supplements you use. Also tell them if you smoke, drink alcohol, or use illegal drugs. Some items may interact with your medicine. What should I watch for while using this  medicine? Visit your health care professional or doctor for regular checks on your progress. A test called the HbA1C (A1C) will be monitored. This is a simple blood test. It measures your blood sugar control over the last 2 to 3 months. You will receive this test every 3 to 6 months. Learn how to check your blood sugar. Learn the symptoms of low and high blood sugar and how to manage them. Always carry a quick-source of sugar with you in case you have symptoms of low blood sugar. Examples include hard sugar candy or glucose tablets. Make sure others know that you can choke if you eat or drink when you develop serious symptoms of low blood sugar, such as seizures or unconsciousness. They must get medical help at once. Tell your doctor or health care professional if you have high blood sugar. You might need to change the dose of your medicine. If you are sick or exercising more than usual, you might need to change the dose of your medicine. Do not skip meals. Ask your doctor or health care professional if you should avoid alcohol. Many nonprescription cough and cold products contain sugar or alcohol. These can affect blood sugar. Make sure that you have the right kind of syringe for the type of insulin you use. Try not to change the brand and type of insulin or syringe unless your health care professional or doctor tells you to. Switching insulin brand or type can cause dangerously high or low blood sugar. Always keep an extra supply of insulin, syringes, and needles on hand. Use a syringe one time only. Throw away syringe and needle in a closed container to prevent accidental needle sticks. Insulin pens and cartridges should never be shared. Even if the needle is changed, sharing may result in passing of viruses like hepatitis or HIV. Each time you get a new box of pen needles, check to see if they are the same type as the ones you were trained to use. If not, ask your health care professional to show you  how to use this new type properly. Wear a medical ID bracelet or chain, and carry  a card that describes your disease and details of your medicine and dosage times. What side effects may I notice from receiving this medicine? Side effects that you should report to your doctor or health care professional as soon as possible: -allergic reactions like skin rash, itching or hives, swelling of the face, lips, or tongue -breathing problems -signs and symptoms of high blood sugar such as dizziness, dry mouth, dry skin, fruity breath, nausea, stomach pain, increased hunger or thirst, increased urination -signs and symptoms of low blood sugar such as feeling anxious, confusion, dizziness, increased hunger, unusually weak or tired, sweating, shakiness, cold, irritable, headache, blurred vision, fast heartbeat, loss of consciousness Side effects that usually do not require medical attention (report to your doctor or health care professional if they continue or are bothersome): -increase or decrease in fatty tissue under the skin due to overuse of a particular injection site -itching, burning, swelling, or rash at site where injected This list may not describe all possible side effects. Call your doctor for medical advice about side effects. You may report side effects to FDA at 1-800-FDA-1088. Where should I keep my medicine? Keep out of the reach of children. Store unopened insulin vials in a refrigerator between 2 and 8 degrees C (36 and 46 degrees F). Do not freeze or use if the insulin has been frozen. Opened vials (vials currently in use) may be stored in the refrigerator or at room temperature, at approximately 30 degrees C (86 degrees F) or cooler. Keeping your insulin at room temperature decreases the amount of pain during injection. Once opened, your insulin can be used for 28 days. After 28 days, the vial of insulin should be thrown away. Store unopened cartridges or disposable pens in a refrigerator  between 2 and 8 degrees C (36 and 46 degrees F.) Do not freeze or use if the insulin has been frozen. Once opened, the disposable pens and cartridges that are inserted into pens should be kept at room temperature, approximately 30 degrees C (80 degrees F) or cooler. Do not store in the refrigerator. Once opened, the insulin can be used for 28 days. After 28 days, the cartridge or disposable pen should be thrown away. Protect from light and excessive heat. Throw away any unused medicine after the expiration date or after the specified time for room temperature storage has passed. NOTE: This sheet is a summary. It may not cover all possible information. If you have questions about this medicine, talk to your doctor, pharmacist, or health care provider.  2019 [employer] (2018-04-02 12:24:59) Glipizide tablets What is this medicine? GLIPIZIDE (GLIP i zide) helps to treat type 2 diabetes. Treatment is combined with diet and exercise. The medicine helps your body to use insulin better. This medicine may be used for other purposes; ask your health care provider or pharmacist if you have questions. COMMON BRAND NAME(S): Glucotrol What should I tell my health care provider before I take this medicine? They need to know if you have any of these conditions: -diabetic ketoacidosis -glucose-6-phosphate dehydrogenase deficiency -heart disease -kidney disease -liver disease -porphyria -severe infection or injury -thyroid disease -an unusual or allergic reaction to glipizide, sulfa drugs, other medicines, foods, dyes, or preservatives -pregnant or trying to get pregnant -breast-feeding How should I use this medicine? Take this medicine by mouth. Swallow with a drink of water. Do not take with food. Take it 30 minutes before a meal. Follow the directions on the prescription label. If you take this medicine  once a day, take it 30 minutes before breakfast. Take your doses at the same time each day.  Do not take more often than directed. Talk to your pediatrician regarding the use of this medicine in children. Special care may be needed. Elderly patients over 88 years old may have a stronger reaction and need a smaller dose. Overdosage: If you think you have taken too much of this medicine contact a poison control center or emergency room at once. NOTE: This medicine is only for you. Do not share this medicine with others. What if I miss a dose? If you miss a dose, take it as soon as you can. If it is almost time for your next dose, take only that dose. Do not take double or extra doses. What may interact with this medicine? -bosentan -chloramphenicol -cisapride -clarithromycin -medicines for fungal or yeast infections -metoclopramide -probenecid -warfarin Many medications may cause an increase or decrease in blood sugar, these include: -alcohol containing beverages -aspirin and aspirin-like drugs -chloramphenicol -chromium -diuretics -male hormones, like estrogens or progestins and birth control pills -heart medicines -isoniazid -male hormones or anabolic steroids -medicines for weight loss -medicines for allergies, asthma, cold, or cough -medicines for mental problems -medicines called MAO Inhibitors like Nardil, Parnate, Marplan, Eldepryl -niacin -NSAIDs, medicines for pain and inflammation, like ibuprofen or naproxen -pentamidine -phenytoin -probenecid -quinolone antibiotics like ciprofloxacin, levofloxacin, ofloxacin -some herbal dietary supplements -steroid medicines like prednisone or cortisone -thyroid medicine This list may not describe all possible interactions. Give your health care provider a list of all the medicines, herbs, non-prescription drugs, or dietary supplements you use. Also tell them if you smoke, drink alcohol, or use illegal drugs. Some items may interact with your medicine. What should I watch for while using this medicine? Visit your doctor  or health care professional for regular checks on your progress. A test called the HbA1C (A1C) will be monitored. This is a simple blood test. It measures your blood sugar control over the last 2 to 3 months. You will receive this test every 3 to 6 months. Learn how to check your blood sugar. Learn the symptoms of low and high blood sugar and how to manage them. Always carry a quick-source of sugar with you in case you have symptoms of low blood sugar. Examples include hard sugar candy or glucose tablets. Make sure others know that you can choke if you eat or drink when you develop serious symptoms of low blood sugar, such as seizures or unconsciousness. They must get medical help at once. Tell your doctor or health care professional if you have high blood sugar. You might need to change the dose of your medicine. If you are sick or exercising more than usual, you might need to change the dose of your medicine. Do not skip meals. Ask your doctor or health care professional if you should avoid alcohol. Many nonprescription cough and cold products contain sugar or alcohol. These can affect blood sugar. This medicine can make you more sensitive to the sun. Keep out of the sun. If you cannot avoid being in the sun, wear protective clothing and use sunscreen. Do not use sun lamps or tanning beds/booths. Wear a medical ID bracelet or chain, and carry a card that describes your disease and details of your medicine and dosage times. What side effects may I notice from receiving this medicine? Side effects that you should report to your doctor or health care professional as soon as possible: -allergic reactions like skin  rash, itching or hives, swelling of the face, lips, or tongue -breathing problems -dark urine -fever, chills, sore throat -signs and symptoms of low blood sugar such as feeling anxious, confusion, dizziness, increased hunger, unusually weak or tired, sweating, shakiness, cold, irritable,  headache, blurred vision, fast heartbeat, loss of consciousness -unusual bleeding or bruising -yellowing of the eyes or skin Side effects that usually do not require medical attention (report to your doctor or health care professional if they continue or are bothersome): -diarrhea -dizziness -headache -heartburn -nausea -stomach gas This list may not describe all possible side effects. Call your doctor for medical advice about side effects. You may report side effects to FDA at 1-800-FDA-1088. Where should I keep my medicine? Keep out of the reach of children. Store at room temperature below 30 degrees C (86 degrees F). Throw away any unused medicine after the expiration date. NOTE: This sheet is a summary. It may not cover all possible information. If you have questions about this medicine, talk to your doctor, pharmacist, or health care provider.  2019 [employer] (2013-01-12 14:42:46) Metformin tablets What is this medicine? METFORMIN (met FOR min) is used to treat type 2 diabetes. It helps to control blood sugar. Treatment is combined with diet and exercise. This medicine can be used alone or with other medicines for diabetes. This medicine may be used for other purposes; ask your health care provider or pharmacist if you have questions. COMMON BRAND NAME(S): Glucophage What should I tell my health care provider before I take this medicine? They need to know if you have any of these conditions: -anemia -dehydration -heart disease -frequently drink alcohol-containing beverages -kidney disease -liver disease -polycystic ovary syndrome -serious infection or injury -vomiting -an unusual or allergic reaction to metformin, other medicines, foods, dyes, or preservatives -pregnant or trying to get pregnant -breast-feeding How should I use this medicine? Take this medicine by mouth with a glass of water. Follow the directions on the prescription label. Take this medicine  with food. Take your medicine at regular intervals. Do not take your medicine more often than directed. Do not stop taking except on your doctor's advice. Talk to your pediatrician regarding the use of this medicine in children. While this drug may be prescribed for children as young as 57 years of age for selected conditions, precautions do apply. Overdosage: If you think you have taken too much of this medicine contact a poison control center or emergency room at once. NOTE: This medicine is only for you. Do not share this medicine with others. What if I miss a dose? If you miss a dose, take it as soon as you can. If it is almost time for your next dose, take only that dose. Do not take double or extra doses. What may interact with this medicine? Do not take this medicine with any of the following medications: -certain contrast medicines given before X-rays, CT scans, MRI, or other procedures -dofetilide This medicine may also interact with the following medications: -acetazolamide -alcohol -certain antivirals for HIV or hepatitis -certain medicines for blood pressure, heart disease, irregular heart beat -cimetidine -dichlorphenamide -digoxin -diuretics -male hormones, like estrogens or progestins and birth control pills -glycopyrrolate -isoniazid -lamotrigine -memantine -methazolamide -metoclopramide -midodrine -niacin -phenothiazines like chlorpromazine, mesoridazine, prochlorperazine, thioridazine -phenytoin -ranolazine -steroid medicines like prednisone or cortisone -stimulant medicines for attention disorders, weight loss, or to stay awake -thyroid medicines -topiramate -trospium -vandetanib -zonisamide This list may not describe all possible interactions. Give your health care provider a list  of all the medicines, herbs, non-prescription drugs, or dietary supplements you use. Also tell them if you smoke, drink alcohol, or use illegal drugs. Some items may interact with  your medicine. What should I watch for while using this medicine? Visit your doctor or health care professional for regular checks on your progress. A test called the HbA1C (A1C) will be monitored. This is a simple blood test. It measures your blood sugar control over the last 2 to 3 months. You will receive this test every 3 to 6 months. Learn how to check your blood sugar. Learn the symptoms of low and high blood sugar and how to manage them. Always carry a quick-source of sugar with you in case you have symptoms of low blood sugar. Examples include hard sugar candy or glucose tablets. Make sure others know that you can choke if you eat or drink when you develop serious symptoms of low blood sugar, such as seizures or unconsciousness. They must get medical help at once. Tell your doctor or health care professional if you have high blood sugar. You might need to change the dose of your medicine. If you are sick or exercising more than usual, you might need to change the dose of your medicine. Do not skip meals. Ask your doctor or health care professional if you should avoid alcohol. Many nonprescription cough and cold products contain sugar or alcohol. These can affect blood sugar. This medicine may cause ovulation in premenopausal women who do not have regular monthly periods. This may increase your chances of becoming pregnant. You should not take this medicine if you become pregnant or think you may be pregnant. Talk with your doctor or health care professional about your birth control options while taking this medicine. Contact your doctor or health care professional right away if you think you are pregnant. If you are going to need surgery, a MRI, CT scan, or other procedure, tell your doctor that you are taking this medicine. You may need to stop taking this medicine before the procedure. Wear a medical ID bracelet or chain, and carry a card that describes your disease and details of your medicine  and dosage times. This medicine may cause a decrease in folic acid and vitamin B12. You should make sure that you get enough vitamins while you are taking this medicine. Discuss the foods you eat and the vitamins you take with your health care professional. What side effects may I notice from receiving this medicine? Side effects that you should report to your doctor or health care professional as soon as possible: -allergic reactions like skin rash, itching or hives, swelling of the face, lips, or tongue -breathing problems -feeling faint or lightheaded, falls -muscle aches or pains -signs and symptoms of low blood sugar such as feeling anxious, confusion, dizziness, increased hunger, unusually weak or tired, sweating, shakiness, cold, irritable, headache, blurred vision, fast heartbeat, loss of consciousness -slow or irregular heartbeat -unusual stomach pain or discomfort -unusually tired or weak Side effects that usually do not require medical attention (report to your doctor or health care professional if they continue or are bothersome): -diarrhea -headache -heartburn -metallic taste in mouth -nausea -stomach gas, upset This list may not describe all possible side effects. Call your doctor for medical advice about side effects. You may report side effects to FDA at 1-800-FDA-1088. Where should I keep my medicine? Keep out of the reach of children. Store at room temperature between 15 and 30 degrees C (59 and 86  degrees F). Protect from moisture and light. Throw away any unused medicine after the expiration date. NOTE: This sheet is a summary. It may not cover all possible information. If you have questions about this medicine, talk to your doctor, pharmacist, or health care provider.  2019 [employer] (2017-11-05 19:15:19)

## 2019-03-16 NOTE — Progress Notes (Signed)
Patient [facility]   Established Patient Office Visit  Subjective:  Patient ID: [name] [name], male    DOB: Feb 28, 1946  Age: 73 y.o. MRN: [id number]  CC:  Chief Complaint  Patient presents with  . Follow-up    chronic condition     HPI [name] [name] is a 73 year old male who presents for Follow up today.   Past Medical History:  Diagnosis Date  . 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 Closed comminuted intertrochanteric fracture of left femur ([place])   . Diabetes mellitus without complication ([place])   . Diabetic hyperosmolar non-ketotic state ([place]) 05/15/2016  . DKA (diabetic ketoacidoses) ([place]) 05/09/2016  . Hypertension   . Postoperative anemia due to acute blood loss 06/18/2016  . Retroperitoneal hematoma 06/18/2016  . Stroke ([place])   . Vitamin B 12 deficiency 06/18/2016   Current Status: Since his last office visit, he is doing well with no complaints. His blood glucose and Hgb A1c levels are undetectable today. His most recent normal range of preprandial blood glucose levels have been between 500 or higher. He denies fatigue, frequent urination, blurred vision, excessive hunger, excessive thirst, weight gain, weight loss, and poor wound healing. He  continues to check his feet regularly. He does not control his diet. He continues to eat high carb food, drink sweet tea and whole milk, and cakes and and cookies.   He denies fevers, chills, fatigue, recent infections, weight loss, and night sweats. He has not had any headaches, visual changes, dizziness, and falls. No chest pain, heart palpitations, cough and shortness of breath reported. No reports of GI problems such as nausea, vomiting, diarrhea, and constipation. He has no reports of blood in stools, dysuria and hematuria. No depression or anxiety reported.  He denies pain today.   Past Surgical History:  Procedure Laterality Date  . HIP  ARTHROPLASTY Right 02/05/2013   Procedure: ARTHROPLASTY BIPOLAR HIP;  Surgeon: [name], MD;  Location: [facility];  Service: Orthopedics;  Laterality: Right;  . INTRAMEDULLARY (IM) NAIL INTERTROCHANTERIC Left 06/16/2016   Procedure: INTRAMEDULLARY (IM) NAIL INTERTROCHANTRIC;  Surgeon: Rod Can, MD;  Location: [facility];  Service: Orthopedics;  Laterality: Left;    Family History  Problem Relation Age of Onset  . Diabetes Mother   . Alzheimer's disease Mother   . Hypertension Mother   . Hyperlipidemia Father   . Hypertension Father   . Healthy Maternal Grandmother   . Pneumonia Maternal Grandfather     Social History   Socioeconomic History  . Marital status: Single    Spouse name: Not on file  . Number of children: 2  . Years of education: 21  . Highest education level: Not on file  Occupational History  . Occupation: Retired  Scientific laboratory technician  . Financial resource strain: Not on file  . Food insecurity:    Worry: Not on file    Inability: Not on file  . Transportation needs:    Medical: Not on file    Non-medical: Not on file  Tobacco Use  . Smoking status: Current Some Day Smoker    Packs/day: 0.25    Years: 30.00    Pack years: 7.50    Types: Cigarettes    Last attempt to quit: 01/31/2013    Years since quitting: 6.1  . Smokeless tobacco: Never Used  . Tobacco comment: "some days I light one", requests a nicotine patch  Substance and Sexual Activity  . Alcohol use: No  . Drug use: No  . Sexual activity: Never  Lifestyle  . Physical activity:    Days per week: Not on file    Minutes per session: Not on file  . Stress: Not on file  Relationships  . Social connections:    Talks on phone: Not on file    Gets together: Not on file    Attends religious service: Not on file    Active member of club or organization: Not on file    Attends meetings of clubs or organizations: Not on file    Relationship status: Not on file  . Intimate partner violence:    Fear  of current or ex partner: Not on file    Emotionally abused: Not on file    Physically abused: Not on file    Forced sexual activity: Not on file  Other Topics Concern  . Not on file  Social History Narrative   Fun: Likes to do handyman related projects, photography.    Denies religious beliefs effecting health care.     Outpatient Medications Prior to Visit  Medication Sig Dispense Refill  . amLODipine (NORVASC) 10 MG tablet Take 1 tablet (10 mg total) by mouth daily. 30 tablet 11  . Blood Glucose Monitoring Suppl (ACCU-CHEK GUIDE) w/Device KIT 1 Device by Does not apply route QID. 1 kit 0  . FLUoxetine (PROZAC) 40 MG capsule Take 40 mg by mouth daily.    Marland Kitchen glucose blood (ACCU-CHEK GUIDE) test strip Use as instructed 100 each 12  . glucose blood (TRUE METRIX BLOOD GLUCOSE TEST) test strip CHECK BLOOD SUGAR UP TO 4 TIMES A DAY. 300 each 0  . insulin aspart protamine- aspart (NOVOLOG MIX 70/30) (70-30) 100 UNIT/ML injection Inject 0.2 mLs (20 Units total) into the skin daily with breakfast. 10 mL 11  . insulin aspart protamine- aspart (NOVOLOG MIX 70/30) (70-30) 100 UNIT/ML injection Inject 0.15 mLs (15 Units total) into the skin daily with supper. 10 mL 11   No facility-administered medications prior to visit.     No Known Allergies  ROS Review of Systems    Objective:    Physical Exam  BP 118/66 (BP Location: Left Arm, Patient Position: Sitting, Cuff Size: Small)   Pulse 64   Temp 98.4 F (36.9 C) (Oral)   Ht _0  (1.778 m)   Wt 141 lb (64 kg)   SpO2 100%   BMI 20.23 kg/m  Wt Readings from Last 3 Encounters:  03/16/19 141 lb (64 kg)  02/01/19 142 lb 12.8 oz (64.8 kg)  10/02/18 146 lb (66.2 kg)     Health Maintenance Due  Topic Date Due  . URINE MICROALBUMIN  02/13/1955  . COLONOSCOPY  02/13/1995  . OPHTHALMOLOGY EXAM  10/14/2016  . FOOT EXAM  05/12/2018    There are no preventive care reminders to display for this patient.  Lab Results  Component Value  Date   TSH 2.840 05/25/2018   Lab Results  Component Value Date   WBC 6.3 01/28/2019   HGB 13.8 01/28/2019   HCT 40.7 01/28/2019   MCV 88.7 01/28/2019   PLT 170 01/28/2019   Lab Results  Component Value Date   NA 128 (L) 01/31/2019   K 4.7 01/31/2019   CO2 20 (L) 01/31/2019   GLUCOSE 667 (HH) 01/31/2019   BUN 36 (H) 01/31/2019   CREATININE 1.89 (H) 01/31/2019   BILITOT 0.2 (L) 08/17/2018   ALKPHOS  66 08/17/2018   AST 31 08/17/2018   ALT 22 08/17/2018   PROT 8.5 (H) 08/17/2018   ALBUMIN 4.0 08/17/2018   CALCIUM 8.6 (L) 01/31/2019   ANIONGAP 10 01/31/2019   GFR 78.20 05/26/2016   Lab Results  Component Value Date   CHOL 294 (H) 01/27/2019   Lab Results  Component Value Date   HDL 66 01/27/2019   Lab Results  Component Value Date   LDLCALC 152 (H) 01/27/2019   Lab Results  Component Value Date   TRIG 380 (H) 01/27/2019   Lab Results  Component Value Date   CHOLHDL 4.5 01/27/2019   Lab Results  Component Value Date   HGBA1C >15.5 (H) 01/27/2019      Assessment & Plan:   1. Uncontrolled type 2 diabetes mellitus with hyperglycemia ([name]) Blood glucose levels continue to be elevated, as r/t his diet. lenthy discussion about diabetic diet and taking medications as prescribed. We will dose change Insulin, and add Metformin and Glucotrol today. He will continue to decrease foods/beverages high in sugars and carbs and follow Heart Healthy or DASH diet. Increase physical activity to at least 30 minutes cardio exercise daily. We will also refer patient to Endocrinology for further assessment - Ambulatory referral to Endocrinology - CBC with Differential - Comprehensive metabolic panel - Lipid Panel - TSH - Hemoglobin A1c - Insulin Glargine (LANTUS SOLOSTAR) 100 UNIT/ML Solostar Pen; Inject 50 Units into the skin daily.  Dispense: 5 pen; Refill: PRN - insulin lispro (HUMALOG) 100 UNIT/ML injection; Inject 30 units of insulin before meals and at bedtime for glucose  levels >125  Dispense: 10 mL; Refill: 3 - glipiZIDE (GLUCOTROL) 10 MG tablet; Take 1 tablet (10 mg total) by mouth 2 (two) times daily before a meal.  Dispense: 60 tablet; Refill: 3 - metFORMIN (GLUCOPHAGE) 500 MG tablet; Take 1 tablet (500 mg total) by mouth 2 (two) times daily with a meal.  Dispense: 180 tablet; Refill: 3  2. Hyperglycemia - insulin lispro (HUMALOG) injection 20 Units - Ambulatory referral to Endocrinology - Hemoglobin A1c - Insulin Glargine (LANTUS SOLOSTAR) 100 UNIT/ML Solostar Pen; Inject 50 Units into the skin daily.  Dispense: 5 pen; Refill: PRN - insulin lispro (HUMALOG) 100 UNIT/ML injection; Inject 30 units of insulin before meals and at bedtime for glucose levels >125  Dispense: 10 mL; Refill: 3 - glipiZIDE (GLUCOTROL) 10 MG tablet; Take 1 tablet (10 mg total) by mouth 2 (two) times daily before a meal.  Dispense: 60 tablet; Refill: 3 - metFORMIN (GLUCOPHAGE) 500 MG tablet; Take 1 tablet (500 mg total) by mouth 2 (two) times daily with a meal.  Dispense: 180 tablet; Refill: 3  3. Type 2 diabetes mellitus without complication, with long-term current use of insulin ([name]) - POCT urinalysis dipstick - POCT glucose (manual entry) - glipiZIDE (GLUCOTROL) 10 MG tablet; Take 1 tablet (10 mg total) by mouth 2 (two) times daily before a meal.  Dispense: 60 tablet; Refill: 3 - metFORMIN (GLUCOPHAGE) 500 MG tablet; Take 1 tablet (500 mg total) by mouth 2 (two) times daily with a meal.  Dispense: 180 tablet; Refill: 3  4. Essential hypertension The current medical regimen is effective; blood pressure is stable at 118/66 today; continue present plan and medications as prescribed. He will continue to decrease high sodium intake, excessive alcohol intake, increase potassium intake, smoking cessation, and increase physical activity of at least 30 minutes of cardio activity daily. He will continue to follow Heart Healthy or DASH diet.  5.  Need for pneumococcal vaccine - Pneumococcal  conjugate vaccine 13-valent IM  6. Vitamin D deficiency - Vitamin D, 25-hydroxy  7. Vitamin B12 deficiency - Vitamin B12  8. Follow up He will follow up in 1 month.   Meds ordered this encounter  Medications  . insulin lispro (HUMALOG) injection 20 Units  . Insulin Glargine (LANTUS SOLOSTAR) 100 UNIT/ML Solostar Pen    Sig: Inject 50 Units into the skin daily.    Dispense:  5 pen    Refill:  PRN  . insulin lispro (HUMALOG) 100 UNIT/ML injection    Sig: Inject 30 units of insulin before meals and at bedtime for glucose levels >125    Dispense:  10 mL    Refill:  3  . glipiZIDE (GLUCOTROL) 10 MG tablet    Sig: Take 1 tablet (10 mg total) by mouth 2 (two) times daily before a meal.    Dispense:  60 tablet    Refill:  3  . metFORMIN (GLUCOPHAGE) 500 MG tablet    Sig: Take 1 tablet (500 mg total) by mouth 2 (two) times daily with a meal.    Dispense:  180 tablet    Refill:  3    Orders Placed This Encounter  Procedures  . Pneumococcal conjugate vaccine 13-valent IM  . CBC with Differential  . Comprehensive metabolic panel  . Lipid Panel  . TSH  . Vitamin B12  . Vitamin D, 25-hydroxy  . Hemoglobin A1c  . Ambulatory referral to Endocrinology  . POCT urinalysis dipstick  . POCT glucose (manual entry)    Referral Orders     Ambulatory referral to Endocrinology   Kathe Becton,  MSN, [name]-BC Patient [facility] [place], [place] [phone]    Problem List Items Addressed This Visit      Cardiovascular and Mediastinum   HTN (hypertension) (Chronic)     Other   Hyperglycemia   Relevant Medications   insulin lispro (HUMALOG) injection 20 Units (Completed)   Insulin Glargine (LANTUS SOLOSTAR) 100 UNIT/ML Solostar Pen   insulin lispro (HUMALOG) 100 UNIT/ML injection   glipiZIDE (GLUCOTROL) 10 MG tablet   metFORMIN (GLUCOPHAGE) 500 MG tablet   Other Relevant Orders   Ambulatory referral to Endocrinology    Hemoglobin A1c    Other Visit Diagnoses    Uncontrolled type 2 diabetes mellitus with hyperglycemia ([name])    -  Primary   Relevant Medications   insulin lispro (HUMALOG) injection 20 Units (Completed)   Insulin Glargine (LANTUS SOLOSTAR) 100 UNIT/ML Solostar Pen   insulin lispro (HUMALOG) 100 UNIT/ML injection   glipiZIDE (GLUCOTROL) 10 MG tablet   metFORMIN (GLUCOPHAGE) 500 MG tablet   Other Relevant Orders   Ambulatory referral to Endocrinology   CBC with Differential   Comprehensive metabolic panel   Lipid Panel   TSH   Hemoglobin A1c   Type 2 diabetes mellitus without complication, with long-term current use of insulin ([name])       Relevant Medications   insulin lispro (HUMALOG) injection 20 Units (Completed)   Insulin Glargine (LANTUS SOLOSTAR) 100 UNIT/ML Solostar Pen   insulin lispro (HUMALOG) 100 UNIT/ML injection   glipiZIDE (GLUCOTROL) 10 MG tablet   metFORMIN (GLUCOPHAGE) 500 MG tablet   Other Relevant Orders   POCT urinalysis dipstick (Completed)   POCT glucose (manual entry) (Completed)   Need for pneumococcal vaccine       Relevant Orders   Pneumococcal conjugate vaccine 13-valent IM (Completed)  Vitamin D deficiency       Relevant Orders   Vitamin D, 25-hydroxy   Vitamin B12 deficiency       Relevant Orders   Vitamin B12   Follow up          Meds ordered this encounter  Medications  . insulin lispro (HUMALOG) injection 20 Units  . Insulin Glargine (LANTUS SOLOSTAR) 100 UNIT/ML Solostar Pen    Sig: Inject 50 Units into the skin daily.    Dispense:  5 pen    Refill:  PRN  . insulin lispro (HUMALOG) 100 UNIT/ML injection    Sig: Inject 30 units of insulin before meals and at bedtime for glucose levels >125    Dispense:  10 mL    Refill:  3  . glipiZIDE (GLUCOTROL) 10 MG tablet    Sig: Take 1 tablet (10 mg total) by mouth 2 (two) times daily before a meal.    Dispense:  60 tablet    Refill:  3  . metFORMIN (GLUCOPHAGE) 500 MG tablet    Sig: Take 1  tablet (500 mg total) by mouth 2 (two) times daily with a meal.    Dispense:  180 tablet    Refill:  3    Follow-up: Return in about 1 month (around 04/15/2019).    Azzie Glatter, [name]

## 2019-03-17 ENCOUNTER — Encounter: Payer: Self-pay | Admitting: Family Medicine

## 2019-03-17 ENCOUNTER — Telehealth: Payer: Self-pay

## 2019-03-17 LAB — LIPID PANEL
Chol/HDL Ratio: 3.6 ratio (ref 0.0–5.0)
Cholesterol, Total: 220 mg/dL — ABNORMAL HIGH (ref 100–199)
HDL: 61 mg/dL (ref >39)
LDL Calculated: 117 mg/dL — ABNORMAL HIGH (ref 0–99)
Triglycerides: 212 mg/dL — ABNORMAL HIGH (ref 0–149)
VLDL Cholesterol Cal: 42 mg/dL — ABNORMAL HIGH (ref 5–40)

## 2019-03-17 LAB — COMPREHENSIVE METABOLIC PANEL
ALT: 13 IU/L (ref 0–44)
AST: 13 IU/L (ref 0–40)
Albumin/Globulin Ratio: 1.7 (ref 1.2–2.2)
Albumin: 4.3 g/dL (ref 3.7–4.7)
Alkaline Phosphatase: 108 IU/L (ref 39–117)
BUN/Creatinine Ratio: 13 (ref 10–24)
BUN: 27 mg/dL (ref 8–27)
Bilirubin Total: 0.3 mg/dL (ref 0.0–1.2)
CO2: 17 mmol/L — ABNORMAL LOW (ref 20–29)
Calcium: 9.2 mg/dL (ref 8.6–10.2)
Chloride: 95 mmol/L — ABNORMAL LOW (ref 96–106)
Creatinine, Ser: 2.15 mg/dL — ABNORMAL HIGH (ref 0.76–1.27)
GFR calc Af Amer: 34 mL/min/{1.73_m2} — ABNORMAL LOW (ref >59)
GFR calc non Af Amer: 29 mL/min/{1.73_m2} — ABNORMAL LOW (ref >59)
Globulin, Total: 2.6 g/dL (ref 1.5–4.5)
Glucose: 510 mg/dL (ref 65–99)
Potassium: 3.9 mmol/L (ref 3.5–5.2)
Sodium: 131 mmol/L — ABNORMAL LOW (ref 134–144)
Total Protein: 6.9 g/dL (ref 6.0–8.5)

## 2019-03-17 LAB — CBC WITH DIFFERENTIAL/PLATELET
Basophils Absolute: 0 10*3/uL (ref 0.0–0.2)
Basos: 1 %
EOS (ABSOLUTE): 0.5 10*3/uL — ABNORMAL HIGH (ref 0.0–0.4)
Eos: 7 %
Hematocrit: 33.1 % — ABNORMAL LOW (ref 37.5–51.0)
Hemoglobin: 10.6 g/dL — ABNORMAL LOW (ref 13.0–17.7)
Immature Grans (Abs): 0 10*3/uL (ref 0.0–0.1)
Immature Granulocytes: 0 %
Lymphocytes Absolute: 1.9 10*3/uL (ref 0.7–3.1)
Lymphs: 28 %
MCH: 28.7 pg (ref 26.6–33.0)
MCHC: 32 g/dL (ref 31.5–35.7)
MCV: 90 fL (ref 79–97)
Monocytes Absolute: 0.4 10*3/uL (ref 0.1–0.9)
Monocytes: 7 %
Neutrophils Absolute: 3.8 10*3/uL (ref 1.4–7.0)
Neutrophils: 57 %
Platelets: 121 10*3/uL — ABNORMAL LOW (ref 150–450)
RBC: 3.69 x10E6/uL — ABNORMAL LOW (ref 4.14–5.80)
RDW: 13.5 % (ref 11.6–15.4)
WBC: 6.6 10*3/uL (ref 3.4–10.8)

## 2019-03-17 LAB — HEMOGLOBIN A1C
Est. average glucose Bld gHb Est-mCnc: 392 mg/dL
Hgb A1c MFr Bld: 15.3 % — ABNORMAL HIGH (ref 4.8–5.6)

## 2019-03-17 LAB — VITAMIN D 25 HYDROXY (VIT D DEFICIENCY, FRACTURES): Vit D, 25-Hydroxy: 16.8 ng/mL — ABNORMAL LOW (ref 30.0–100.0)

## 2019-03-17 LAB — TSH: TSH: 4.24 u[IU]/mL (ref 0.450–4.500)

## 2019-03-17 LAB — VITAMIN B12: Vitamin B-12: 725 pg/mL (ref 232–1245)

## 2019-03-17 NOTE — Telephone Encounter (Signed)
-----   Message from Azzie Glatter, [place] sent at 03/16/2019 10:27 PM EDT ----- Regarding: "New Medications" Please inform patient that new Rxs for Metformin and Glucotrol to pharmacy.

## 2019-03-17 NOTE — Telephone Encounter (Signed)
Patient sister [name] has been notified and will pick up patient medication

## 2019-03-17 NOTE — Telephone Encounter (Signed)
Message not needed. °

## 2019-03-17 NOTE — Progress Notes (Signed)
After hours answer service called at 2:55am. [facility] with critical lab value of 510 for glucose. Review of records reveals that patient had similar glucose level in office in the past. Non compliant with medications and treatment. Will forward information to PCP, Providence Crosby, [name]

## 2019-03-19 DIAGNOSIS — E1122 Type 2 diabetes mellitus with diabetic chronic kidney disease: Secondary | ICD-10-CM | POA: Diagnosis not present

## 2019-03-19 DIAGNOSIS — N183 Chronic kidney disease, stage 3 (moderate): Secondary | ICD-10-CM | POA: Diagnosis not present

## 2019-03-19 DIAGNOSIS — I1310 Hypertensive heart and chronic kidney disease without heart failure, with stage 1 through stage 4 chronic kidney disease, or unspecified chronic kidney disease: Secondary | ICD-10-CM | POA: Diagnosis not present

## 2019-03-19 DIAGNOSIS — Z9181 History of falling: Secondary | ICD-10-CM | POA: Diagnosis not present

## 2019-03-19 DIAGNOSIS — F0390 Unspecified dementia without behavioral disturbance: Secondary | ICD-10-CM | POA: Diagnosis not present

## 2019-03-19 DIAGNOSIS — E1100 Type 2 diabetes mellitus with hyperosmolarity without nonketotic hyperglycemic-hyperosmolar coma (NKHHC): Secondary | ICD-10-CM | POA: Diagnosis not present

## 2019-03-19 DIAGNOSIS — Z794 Long term (current) use of insulin: Secondary | ICD-10-CM | POA: Diagnosis not present

## 2019-03-19 DIAGNOSIS — Z9111 Patient's noncompliance with dietary regimen: Secondary | ICD-10-CM | POA: Diagnosis not present

## 2019-03-19 DIAGNOSIS — F418 Other specified anxiety disorders: Secondary | ICD-10-CM | POA: Diagnosis not present

## 2019-03-19 DIAGNOSIS — E1165 Type 2 diabetes mellitus with hyperglycemia: Secondary | ICD-10-CM | POA: Diagnosis not present

## 2019-03-19 DIAGNOSIS — Z8673 Personal history of transient ischemic attack (TIA), and cerebral infarction without residual deficits: Secondary | ICD-10-CM | POA: Diagnosis not present

## 2019-03-21 ENCOUNTER — Encounter: Payer: Self-pay | Admitting: Family Medicine

## 2019-03-21 ENCOUNTER — Other Ambulatory Visit: Payer: Self-pay | Admitting: Family Medicine

## 2019-03-21 ENCOUNTER — Telehealth: Payer: Self-pay

## 2019-03-21 DIAGNOSIS — E1165 Type 2 diabetes mellitus with hyperglycemia: Secondary | ICD-10-CM

## 2019-03-21 DIAGNOSIS — E559 Vitamin D deficiency, unspecified: Secondary | ICD-10-CM

## 2019-03-21 MED ORDER — ATORVASTATIN CALCIUM 10 MG PO TABS: 10.0000 mg | ORAL_TABLET | Freq: Every day | ORAL | 3 refills | Status: DC

## 2019-03-21 MED ORDER — VITAMIN D (ERGOCALCIFEROL) 1.25 MG (50000 UNIT) PO CAPS: 50000.0000 [IU] | ORAL_CAPSULE | ORAL | 3 refills | Status: DC

## 2019-03-21 NOTE — Telephone Encounter (Signed)
Called and spoke with patient. Advised that diabetic medication has been sent to pharmacy along with ne rx's for Vitamin D deficiency and Lipitor for cholesterol. Asked that he take these as directed. Asked that he increase physical activity to 30 minutes of cardio daily and to eat low carb/ low sugar heart healthy diet. Patient verbalized understanding. Thanks!

## 2019-03-21 NOTE — Telephone Encounter (Signed)
-----   Message from Azzie Glatter, [place] sent at 03/21/2019 11:54 AM EDT ----- Anti-diabetic medications called to pharmacy last week. Additional Rxs sent to pharmacy today for Vitamin D Deficiency and Lipitor. Please inform patient. Remind him to continue to decrease foods/beverages high in sugars and carbs and follow Heart Healthy or DASH diet. Increase physical activity to at least 30 minutes cardio exercise daily. Thank you.

## 2019-03-23 ENCOUNTER — Encounter (HOSPITAL_COMMUNITY): Payer: Self-pay

## 2019-03-23 DIAGNOSIS — E1100 Type 2 diabetes mellitus with hyperosmolarity without nonketotic hyperglycemic-hyperosmolar coma (NKHHC): Secondary | ICD-10-CM | POA: Diagnosis not present

## 2019-03-23 DIAGNOSIS — I1310 Hypertensive heart and chronic kidney disease without heart failure, with stage 1 through stage 4 chronic kidney disease, or unspecified chronic kidney disease: Secondary | ICD-10-CM | POA: Diagnosis not present

## 2019-03-23 DIAGNOSIS — F0390 Unspecified dementia without behavioral disturbance: Secondary | ICD-10-CM | POA: Diagnosis not present

## 2019-03-23 DIAGNOSIS — E1165 Type 2 diabetes mellitus with hyperglycemia: Secondary | ICD-10-CM | POA: Diagnosis not present

## 2019-03-23 DIAGNOSIS — E1122 Type 2 diabetes mellitus with diabetic chronic kidney disease: Secondary | ICD-10-CM | POA: Diagnosis not present

## 2019-03-23 DIAGNOSIS — N183 Chronic kidney disease, stage 3 (moderate): Secondary | ICD-10-CM | POA: Diagnosis not present

## 2019-04-19 ENCOUNTER — Ambulatory Visit: Payer: Medicare Other | Admitting: Family Medicine

## 2019-05-02 ENCOUNTER — Ambulatory Visit: Payer: Medicare Other | Admitting: Internal Medicine

## 2019-05-16 ENCOUNTER — Inpatient Hospital Stay (HOSPITAL_COMMUNITY)
Admission: EM | Admit: 2019-05-16 | Discharge: 2019-05-17 | DRG: 638 | Discharge status: Against Medical Advice | Payer: Medicare Other | Attending: Internal Medicine | Admitting: Internal Medicine

## 2019-05-16 ENCOUNTER — Other Ambulatory Visit: Payer: Self-pay

## 2019-05-16 ENCOUNTER — Encounter (HOSPITAL_COMMUNITY): Payer: Self-pay | Admitting: Emergency Medicine

## 2019-05-16 DIAGNOSIS — E1165 Type 2 diabetes mellitus with hyperglycemia: Secondary | ICD-10-CM

## 2019-05-16 DIAGNOSIS — IMO0002 Reserved for concepts with insufficient information to code with codable children: Secondary | ICD-10-CM

## 2019-05-16 DIAGNOSIS — Z8673 Personal history of transient ischemic attack (TIA), and cerebral infarction without residual deficits: Secondary | ICD-10-CM

## 2019-05-16 DIAGNOSIS — F10920 Alcohol use, unspecified with intoxication, uncomplicated: Secondary | ICD-10-CM | POA: Diagnosis not present

## 2019-05-16 DIAGNOSIS — E1110 Type 2 diabetes mellitus with ketoacidosis without coma: Secondary | ICD-10-CM | POA: Diagnosis not present

## 2019-05-16 DIAGNOSIS — F17210 Nicotine dependence, cigarettes, uncomplicated: Secondary | ICD-10-CM | POA: Diagnosis present

## 2019-05-16 DIAGNOSIS — I5032 Chronic diastolic (congestive) heart failure: Secondary | ICD-10-CM | POA: Diagnosis present

## 2019-05-16 DIAGNOSIS — N183 Chronic kidney disease, stage 3 unspecified: Secondary | ICD-10-CM | POA: Diagnosis present

## 2019-05-16 DIAGNOSIS — Z8349 Family history of other endocrine, nutritional and metabolic diseases: Secondary | ICD-10-CM

## 2019-05-16 DIAGNOSIS — Z5329 Procedure and treatment not carried out because of patient's decision for other reasons: Secondary | ICD-10-CM | POA: Diagnosis present

## 2019-05-16 DIAGNOSIS — Z20828 Contact with and (suspected) exposure to other viral communicable diseases: Secondary | ICD-10-CM | POA: Diagnosis present

## 2019-05-16 DIAGNOSIS — E1121 Type 2 diabetes mellitus with diabetic nephropathy: Secondary | ICD-10-CM

## 2019-05-16 DIAGNOSIS — I10 Essential (primary) hypertension: Secondary | ICD-10-CM

## 2019-05-16 DIAGNOSIS — Z96641 Presence of right artificial hip joint: Secondary | ICD-10-CM | POA: Diagnosis present

## 2019-05-16 DIAGNOSIS — E538 Deficiency of other specified B group vitamins: Secondary | ICD-10-CM | POA: Diagnosis present

## 2019-05-16 DIAGNOSIS — E1122 Type 2 diabetes mellitus with diabetic chronic kidney disease: Secondary | ICD-10-CM | POA: Diagnosis present

## 2019-05-16 DIAGNOSIS — Z8249 Family history of ischemic heart disease and other diseases of the circulatory system: Secondary | ICD-10-CM

## 2019-05-16 DIAGNOSIS — E785 Hyperlipidemia, unspecified: Secondary | ICD-10-CM | POA: Diagnosis present

## 2019-05-16 DIAGNOSIS — F10129 Alcohol abuse with intoxication, unspecified: Secondary | ICD-10-CM | POA: Diagnosis present

## 2019-05-16 DIAGNOSIS — Z833 Family history of diabetes mellitus: Secondary | ICD-10-CM

## 2019-05-16 DIAGNOSIS — Z820 Family history of epilepsy and other diseases of the nervous system: Secondary | ICD-10-CM

## 2019-05-16 DIAGNOSIS — F1010 Alcohol abuse, uncomplicated: Secondary | ICD-10-CM

## 2019-05-16 DIAGNOSIS — I130 Hypertensive heart and chronic kidney disease with heart failure and stage 1 through stage 4 chronic kidney disease, or unspecified chronic kidney disease: Secondary | ICD-10-CM | POA: Diagnosis present

## 2019-05-16 DIAGNOSIS — Z794 Long term (current) use of insulin: Secondary | ICD-10-CM

## 2019-05-16 LAB — CBC WITH DIFFERENTIAL/PLATELET
Abs Immature Granulocytes: 0.01 10*3/uL (ref 0.00–0.07)
Basophils Absolute: 0 10*3/uL (ref 0.0–0.1)
Basophils Relative: 0 %
Eosinophils Absolute: 0.2 10*3/uL (ref 0.0–0.5)
Eosinophils Relative: 3 %
HCT: 38.5 % — ABNORMAL LOW (ref 39.0–52.0)
Hemoglobin: 12.4 g/dL — ABNORMAL LOW (ref 13.0–17.0)
Immature Granulocytes: 0 %
Lymphocytes Relative: 25 %
Lymphs Abs: 1.4 10*3/uL (ref 0.7–4.0)
MCH: 29.9 pg (ref 26.0–34.0)
MCHC: 32.2 g/dL (ref 30.0–36.0)
MCV: 92.8 fL (ref 80.0–100.0)
Monocytes Absolute: 0.5 10*3/uL (ref 0.1–1.0)
Monocytes Relative: 8 %
Neutro Abs: 3.5 10*3/uL (ref 1.7–7.7)
Neutrophils Relative %: 64 %
Platelets: 215 10*3/uL (ref 150–400)
RBC: 4.15 MIL/uL — ABNORMAL LOW (ref 4.22–5.81)
RDW: 13.4 % (ref 11.5–15.5)
WBC: 5.5 10*3/uL (ref 4.0–10.5)
nRBC: 0 % (ref 0.0–0.2)

## 2019-05-16 LAB — COMPREHENSIVE METABOLIC PANEL
ALT: 12 U/L (ref 0–44)
AST: 15 U/L (ref 15–41)
Albumin: 3.8 g/dL (ref 3.5–5.0)
Alkaline Phosphatase: 77 U/L (ref 38–126)
Anion gap: 21 — ABNORMAL HIGH (ref 5–15)
BUN: 31 mg/dL — ABNORMAL HIGH (ref 8–23)
CO2: 11 mmol/L — ABNORMAL LOW (ref 22–32)
Calcium: 9 mg/dL (ref 8.9–10.3)
Chloride: 99 mmol/L (ref 98–111)
Creatinine, Ser: 2.61 mg/dL — ABNORMAL HIGH (ref 0.61–1.24)
GFR calc Af Amer: 27 mL/min — ABNORMAL LOW (ref >60)
GFR calc non Af Amer: 23 mL/min — ABNORMAL LOW (ref >60)
Glucose, Bld: 526 mg/dL (ref 70–99)
Potassium: 3.6 mmol/L (ref 3.5–5.1)
Sodium: 131 mmol/L — ABNORMAL LOW (ref 135–145)
Total Bilirubin: 0.4 mg/dL (ref 0.3–1.2)
Total Protein: 7.4 g/dL (ref 6.5–8.1)

## 2019-05-16 LAB — CBG MONITORING, ED: Glucose-Capillary: 465 mg/dL — ABNORMAL HIGH (ref 70–99)

## 2019-05-16 LAB — ETHANOL: Alcohol, Ethyl (B): 180 mg/dL — ABNORMAL HIGH (ref <10)

## 2019-05-16 MED ORDER — DEXTROSE-NACL 5-0.45 % IV SOLN
INTRAVENOUS | Status: DC
  Administered 2019-05-17: 05:00:00 via INTRAVENOUS

## 2019-05-16 MED ORDER — POTASSIUM CHLORIDE 10 MEQ/100ML IV SOLN
10.0000 meq | Freq: Once | INTRAVENOUS | Status: AC
  Administered 2019-05-17: 10 meq via INTRAVENOUS
  Filled 2019-05-16: qty 100

## 2019-05-16 MED ORDER — INSULIN REGULAR(HUMAN) IN NACL 100-0.9 UT/100ML-% IV SOLN
INTRAVENOUS | Status: DC
  Administered 2019-05-17: 3.6 [IU]/h via INTRAVENOUS
  Filled 2019-05-16: qty 100

## 2019-05-16 MED ORDER — SODIUM CHLORIDE 0.9 % IV BOLUS
1000.0000 mL | Freq: Once | INTRAVENOUS | Status: AC
  Administered 2019-05-16: 23:00:00 1000 mL via INTRAVENOUS

## 2019-05-16 NOTE — H&P (Addendum)
[name] [name] BTD:[id number] DOB: Nov 12, 1945 DOA: 05/16/2019     PCP: Azzie Glatter, [name]   Outpatient Specialists:  NONE    Patient arrived to ER on 05/16/19 at 2154  Patient coming from: home Lives alone     Chief Complaint:  High blood sugar HPI: [name] [name] is a 73 y.o. male with medical history significant of ETOH, DM2, hyperlipidemia History of stroke.  Hypertension.  History of retroperitoneal hematoma.  Vitamin B12 deficiency  Presented with  He dos not know how long he took his Insulin. History of diabetes poorly controlled secondary to noncompliance a with his medications his blood sugar routinely runs in the 500s    EMS was called by a neighbor today after he was noted to be intoxicated CBG showed blood sugar above 500 he is not endorsing any nausea vomiting unable to provide detailed history denies alcohol use but her neighbor has heavy alcohol abuse history.  He is not sure when was the last time he took insulin at all. Reports that he usually takes 5 units in the morning but again unsure  Infectious risk factors:  Reports none  In  ER RAPID COVID TEST in house testing  Pending     Regarding pertinent Chronic problems:     Hyperlipidemia -  on statins Lipitor  Hypertension on Norvasc  Diastolic CHF last echogram in September 2019 showing grade 1 diastolic dysfunction EF 10-62%  DM 2 -  Lab Results  Component Value Date   HGBA1C 15.3 (H) 03/16/2019   on insulin, noncompliant glipizide and metformin  CKD stage 3- baseline Cr 2.2    While in ER:  The following Work up has been ordered so far:  Orders Placed This Encounter  Procedures  . SARS Coronavirus 2 [facility] order, Performed in [facility] hospital lab) Nasopharyngeal Nasopharyngeal Swab  . SARS CORONAVIRUS 2 Nasal Swab Aptima Multi Swab  . DG CHEST PORT 1 VIEW  . CBC with Differential  . Ethanol  . Comprehensive metabolic panel  . Urinalysis, Routine w reflex microscopic  .  Beta-hydroxybutyric acid  . Lactic acid, plasma  . Magnesium  . Phosphorus  . CK  . Protime-INR  . Initiate Carrier Fluid Protocol  . Cardiac monitoring  . Consult for [place] Admission  ALL PATIENTS BEING ADMITTED/HAVING PROCEDURES NEED COVID-19 SCREENING  . Droplet precaution  . CBG monitoring, ED  . CBG monitoring, ED  . EKG 12-Lead  . EKG 12-Lead  . Saline lock IV  . Place in observation (patient's expected length of stay will be less than 2 midnights)     Following Medications were ordered in ER: Medications  dextrose 5 %-0.45 % sodium chloride infusion (has no administration in time range)  insulin regular, human (MYXREDLIN) 100 units/ 100 mL infusion (3.6 Units/hr Intravenous New Bag/Given 05/17/19 0026)  potassium chloride 10 mEq in 100 mL IVPB (10 mEq Intravenous New Bag/Given 05/17/19 0027)  sodium chloride 0.9 % bolus 1,000 mL (1,000 mLs Intravenous New Bag/Given 05/16/19 2259)      Significant initial  Findings: Abnormal Labs Reviewed  CBC WITH DIFFERENTIAL/PLATELET - Abnormal; Notable for the following components:      Result Value   RBC 4.15 (*)    Hemoglobin 12.4 (*)    HCT 38.5 (*)    All other components within normal limits  ETHANOL - Abnormal; Notable for the following components:   Alcohol, Ethyl (B) 180 (*)    All other components within normal limits  COMPREHENSIVE METABOLIC PANEL - Abnormal; Notable for the following components:   Sodium 131 (*)    CO2 11 (*)    Glucose, Bld 526 (*)    BUN 31 (*)    Creatinine, Ser 2.61 (*)    GFR calc non Af Amer 23 (*)    GFR calc Af Amer 27 (*)    Anion gap 21 (*)    All other components within normal limits  CBG MONITORING, ED - Abnormal; Notable for the following components:   Glucose-Capillary 465 (*)    All other components within normal limits  CBG MONITORING, ED - Abnormal; Notable for the following components:   Glucose-Capillary 418 (*)    All other components within normal limits     Otherwise labs showing:    Recent Labs  Lab 05/16/19 2211  NA 131*  K 3.6  CO2 11*  GLUCOSE 526*  BUN 31*  CREATININE 2.61*  CALCIUM 9.0    Cr     Up from baseline see below Lab Results  Component Value Date   CREATININE 2.61 (H) 05/16/2019   CREATININE 2.15 (H) 03/16/2019   CREATININE 1.89 (H) 01/31/2019    Recent Labs  Lab 05/16/19 2211  AST 15  ALT 12  ALKPHOS 77  BILITOT 0.4  PROT 7.4  ALBUMIN 3.8   Lab Results  Component Value Date   CALCIUM 9.0 05/16/2019   PHOS 3.8 08/07/2017      WBC      Component Value Date/Time   WBC 5.5 05/16/2019 2211   ANC    Component Value Date/Time   NEUTROABS 3.5 05/16/2019 2211   NEUTROABS 3.8 03/16/2019 1021   ALC No results found for: LYMPHOABS    Plt: Lab Results  Component Value Date   PLT 215 05/16/2019    Lactic Acid, Venous    Component Value Date/Time   LATICACIDVEN 1.5 08/02/2017 1812     COVID-19 Labs  No results for input(s): DDIMER, FERRITIN, LDH, CRP in the last 72 hours.  No results found for: SARSCOV2NAA      HG/HCT  stable,      Component Value Date/Time   HGB 12.4 (L) 05/16/2019 2211   HGB 10.6 (L) 03/16/2019 1021   HCT 38.5 (L) 05/16/2019 2211   HCT 33.1 (L) 03/16/2019 1021    DM  labs:  HbA1C: Recent Labs    10/03/18 0141 01/27/19 1551 03/16/19 1031  HGBA1C 11.1* >15.5* 15.3*       CBG (last 3)  Recent Labs    05/16/19 2214 05/17/19 0014  GLUCAP 465* 418*      UA   ordered      CXR -  NON acute   ECG:  Personally reviewed by me showing: HR : 66 Rhythm:  NSR,    no evidence of ischemic changes QTC 449     ED Triage Vitals  Enc Vitals Group     BP 05/16/19 2200 139/78     Pulse Rate 05/16/19 2200 69     Resp 05/16/19 2200 16     Temp 05/16/19 2201 97.6 F (36.4 C)     Temp Source 05/16/19 2201 Oral     SpO2 05/16/19 2200 99 %     Weight 05/16/19 2202 140 lb (63.5 kg)     Height 05/16/19 2202 '5\' 11"'  (1.803 m)     Head Circumference --      Peak  Flow --      Pain Score 05/16/19 2202  0     Pain Loc --      Pain Edu? --      Excl. in [place]? --   TMAX(24)@       Latest  Blood pressure 118/66, pulse 64, temperature 97.6 F (36.4 C), temperature source Oral, resp. rate 20, height '5\' 11"'  (1.803 m), weight 63.5 kg, SpO2 95 %.    Hospitalist was called for admission for DKA   Review of Systems:    Pertinent positives include:   fatigue,   Constitutional:  No weight loss, night sweats, Fevers, chillsweight loss  HEENT:  No headaches, Difficulty swallowing,Tooth/dental problems,Sore throat,  No sneezing, itching, ear ache, nasal congestion, post nasal drip,  Cardio-vascular:  No chest pain, Orthopnea, PND, anasarca, dizziness, palpitations.no Bilateral lower extremity swelling  GI:  No heartburn, indigestion, abdominal pain, nausea, vomiting, diarrhea, change in bowel habits, loss of appetite, melena, blood in stool, hematemesis Resp:  no shortness of breath at rest. No dyspnea on exertion, No excess mucus, no productive cough, No non-productive cough, No coughing up of blood.No change in color of mucus.No wheezing. Skin:  no rash or lesions. No jaundice GU:  no dysuria, change in color of urine, no urgency or frequency. No straining to urinate.  No flank pain.  Musculoskeletal:  No joint pain or no joint swelling. No decreased range of motion. No back pain.  Psych:  No change in mood or affect. No depression or anxiety. No memory loss.  Neuro: no localizing neurological complaints, no tingling, no weakness, no double vision, no gait abnormality, no slurred speech, no confusion  All systems reviewed and apart from [place] all are negative  Past Medical History:   Past Medical History:  Diagnosis Date  . Cerebral infarction due to thrombosis of right posterior cerebral artery ([place]) 06/08/2015  . Closed comminuted intertrochanteric fracture of left femur ([place])   . Diabetes mellitus without complication ([place])   . Diabetic  hyperosmolar non-ketotic state ([place]) 05/15/2016  . DKA (diabetic ketoacidoses) ([place]) 05/09/2016  . Hypertension   . Postoperative anemia due to acute blood loss 06/18/2016  . Retroperitoneal hematoma 06/18/2016  . Stroke ([place])   . Vitamin B 12 deficiency 06/18/2016      Past Surgical History:  Procedure Laterality Date  . HIP ARTHROPLASTY Right 02/05/2013   Procedure: ARTHROPLASTY BIPOLAR HIP;  Surgeon: [name], MD;  Location: [facility];  Service: Orthopedics;  Laterality: Right;  . INTRAMEDULLARY (IM) NAIL INTERTROCHANTERIC Left 06/16/2016   Procedure: INTRAMEDULLARY (IM) NAIL INTERTROCHANTRIC;  Surgeon: Rod Can, MD;  Location: [place];  Service: Orthopedics;  Laterality: Left;    Social History:  Ambulatory  independently       reports that he has been smoking cigarettes. He has a 7.50 pack-year smoking history. He has never used smokeless tobacco. He reports that he does not drink alcohol or use drugs.   Family History:   Family History  Problem Relation Age of Onset  . Diabetes Mother   . Alzheimer's disease Mother   . Hypertension Mother   . Hyperlipidemia Father   . Hypertension Father   . Healthy Maternal Grandmother   . Pneumonia Maternal Grandfather     Allergies: No Known Allergies   Prior to Admission medications   Medication Sig Start Date End Date Taking? Authorizing Provider  amLODipine (NORVASC) 10 MG tablet Take 1 tablet (10 mg total) by mouth daily. 05/25/18   Azzie Glatter, [name]  atorvastatin (LIPITOR) 10 MG tablet Take 1 tablet (10  mg total) by mouth daily. 03/21/19   Azzie Glatter, [name]  Blood Glucose Monitoring Suppl (ACCU-CHEK GUIDE) w/Device KIT 1 Device by Does not apply route QID. 11/03/18   Azzie Glatter, [name]  FLUoxetine (PROZAC) 40 MG capsule Take 40 mg by mouth daily.    [provider]  glipiZIDE (GLUCOTROL) 10 MG tablet Take 1 tablet (10 mg total) by mouth 2 (two) times daily before a meal. 03/16/19   Azzie Glatter, [name]   glucose blood (ACCU-CHEK GUIDE) test strip Use as instructed 11/03/18   Azzie Glatter, [name]  glucose blood (TRUE METRIX BLOOD GLUCOSE TEST) test strip CHECK BLOOD SUGAR UP TO 4 TIMES A DAY. 05/25/18   Azzie Glatter, [name]  Insulin Glargine (LANTUS SOLOSTAR) 100 UNIT/ML Solostar Pen Inject 50 Units into the skin daily. 03/16/19   Azzie Glatter, [name]  insulin lispro (HUMALOG) 100 UNIT/ML injection Inject 30 units of insulin before meals and at bedtime for glucose levels >125 03/16/19 03/15/20  Azzie Glatter, [name]  metFORMIN (GLUCOPHAGE) 500 MG tablet Take 1 tablet (500 mg total) by mouth 2 (two) times daily with a meal. 03/16/19   Azzie Glatter, [name]  Vitamin D, Ergocalciferol, (DRISDOL) 1.25 MG (50000 UT) CAPS capsule Take 1 capsule (50,000 Units total) by mouth every 7 (seven) days. 03/21/19   Azzie Glatter, [name]   Physical Exam: Blood pressure 118/66, pulse 64, temperature 97.6 F (36.4 C), temperature source Oral, resp. rate 20, height '5\' 11"'  (1.803 m), weight 63.5 kg, SpO2 95 %. 1. General:  in No  Acute distress    Chronically ill  -appearing 2. Psychological: Alert and  Oriented but intoxicated 3. Head/ENT:    Dry Mucous Membranes                          Head Non traumatic, neck supple                           Poor Dentition 4. SKIN: n decreased Skin turgor,  Skin clean Dry and intact no rash 5. Heart: Regular rate and rhythm no Murmur, no Rub or gallop 6. Lungs: no wheezes or crackles   7. Abdomen: Soft, non-tender, Non distended obese  bowel sounds present 8. Lower extremities: no clubbing, cyanosis, no  edema 9. Neurologically Grossly intact, moving all 4 extremities equally   10. MSK: Normal range of motion   All other LABS:     Recent Labs  Lab 05/16/19 2211  WBC 5.5  NEUTROABS 3.5  HGB 12.4*  HCT 38.5*  MCV 92.8  PLT 215     Recent Labs  Lab 05/16/19 2211  NA 131*  K 3.6  CL 99  CO2 11*  GLUCOSE 526*  BUN 31*  CREATININE 2.61*  CALCIUM 9.0      Recent Labs  Lab 05/16/19 2211  AST 15  ALT 12  ALKPHOS 77  BILITOT 0.4  PROT 7.4  ALBUMIN 3.8       Cultures:    Component Value Date/Time   SDES URINE, RANDOM 01/27/2019 1125   SPECREQUEST NONE 01/27/2019 1125   CULT  01/27/2019 1125    NO GROWTH Performed at [facility], [place] [address], [place], [place] [address]    REPTSTATUS 01/28/2019 FINAL 01/27/2019 1125     Radiological Exams on Admission: Dg Chest Port 1 View  Result Date: 05/17/2019 CLINICAL DATA:  Diabetic, hyperglycemia. EXAM: PORTABLE CHEST 1 VIEW COMPARISON:  01/27/2019 FINDINGS: Heart and mediastinal contours are within normal limits. No focal opacities or effusions. No acute bony abnormality. IMPRESSION: No active disease. Electronically Signed   By: Rolm Baptise M.D.   On: 05/17/2019 00:40    Chart has been reviewed    Assessment/Plan   73 y.o. male with medical history significant of ETOH, DM2, hyperlipidemia History of stroke.  Hypertension.  History of retroperitoneal hematoma.  Vitamin B12 deficiency Admitted for  DKA  Present on Admission: . DKA, type 2 ([place]) - will admit per DKA protocol, obtain serial BMET, start on glucosestabalizer,   IVF.   Will work up cause of DKA with CXR, ECG one set of cardiac enzymes, UA. Monitor in [place]. Replace potassium as needed.    Likely cause been noncompliance will order diabetic coordinator consult  . HTN (hypertension) continue home medications currently stable  . CKD (chronic kidney disease) stage 3, GFR 30-59 ml/min ([name]) able avoid nephrotoxic medications . ETOH abuse - CIWA protocol has been ordered  . Chronic diastolic CHF (congestive heart failure) ([place])- - currently appears to be slightly on the dry side carefuly follow fluid status and Cr    Other plan as per orders.  DVT prophylaxis:  SCD      Code Status:  FULL CODE   I had personally discussed CODE STATUS with patient     Family Communication:   Family not at  Bedside     Disposition Plan:     To home once workup is complete and patient is stable    Diabetes coordinator  Consults called:  none  Admission status:  ED Disposition    ED Disposition Condition [place]: [place] [100100]  Level of Care: Progressive [102]  I expect the patient will be discharged within 24 hours: No (not a candidate for 5C-Observation unit)  Covid Evaluation: Asymptomatic Screening Protocol (No Symptoms)  Diagnosis: DKA, type 2 ([place]) [893810]  Admitting Physician: Toy Baker [[id number]]  Attending Physician: Toy Baker [[id number]]  PT Class (Do Not Modify): Observation [104]  PT Acc Code (Do Not Modify): Observation [10022]        Obs    Level of care        SDU tele indefinitely please discontinue once patient no longer qualifies  Precautions:  Droplet    PPE: Used by the provider:   P100  eye Goggles,  Gloves    Baily Hovanec 05/17/2019, 12:49 AM    [facility]     after 2 AM please page floor coverage PA If 7AM-7PM, please contact the day team taking care of the patient using [email or web address]

## 2019-05-16 NOTE — ED Provider Notes (Signed)
[facility] EMERGENCY DEPARTMENT Provider Note   CSN: [id number] Arrival date & time: 05/16/19  2154     History   Chief Complaint No chief complaint on file.   HPI [name] [name] is a 73 y.o. male.     Per EMS report, they were called by neighbor after CBG check revealed blood sugar of >500. No vomiting, fall, injury, complaint of pain. The patient was reportedly drinking alcohol with a neighbor but the patient denies alcohol use. He does not remember his last insulin dose. He states he only takes 5 Units once daily, morning dose.   The history is provided by the patient and the EMS personnel. No language interpreter was used.    Past Medical History:  Diagnosis Date  . Cerebral infarction due to thrombosis of right posterior cerebral artery ([place]) 06/08/2015  . Closed comminuted intertrochanteric fracture of left femur ([place])   . Diabetes mellitus without complication ([place])   . Diabetic hyperosmolar non-ketotic state ([place]) 05/15/2016  . DKA (diabetic ketoacidoses) ([place]) 05/09/2016  . Hypertension   . Postoperative anemia due to acute blood loss 06/18/2016  . Retroperitoneal hematoma 06/18/2016  . Stroke ([place])   . Vitamin B 12 deficiency 06/18/2016    Patient Active Problem List   Diagnosis Date Noted  . Fall 10/03/2018  . Uncontrolled type 2 diabetes mellitus with hyperglycemia, with long-term current use of insulin ([place]) 10/03/2018  . Chest wall contusion 10/03/2018  . Underweight 11/18/2017  . Normocytic anemia 11/18/2017  . DKA, type 2 ([place]) 08/01/2017  . History of cerebrovascular accident (CVA) in adulthood 07/25/2017  . HTN (hypertension) 07/25/2017  . Depression with anxiety 07/25/2017  . Left ventricular diastolic dysfunction 40/98/1191  . Dementia ([place]) 07/25/2017  . 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 Diabetes mellitus, insulin dependent (IDDM), controlled ([place]) 06/27/2017  . Hypertension 06/27/2017  . Hyperkalemia 01/16/2017  . Weakness  11/16/2016  . Acute renal failure superimposed on stage 3 chronic kidney disease ([place])   . Noncompliance with medication regimen 08/17/2016  . History of DVT (deep vein thrombosis) 07/06/2016  . Vitamin B 12 deficiency 06/18/2016  . Hyperglycemia 05/15/2016  . Diabetic hyperosmolar non-ketotic state ([place]) 05/15/2016  . Hyponatremia 05/15/2016  . Anxiety 05/15/2016  . Narcotic dependency, continuous ([place]) 11/02/2015  . Uncontrolled type 2 diabetes mellitus with diabetic nephropathy, with long-term current use of insulin ([place]) 08/12/2015  . Depression 08/12/2015  . CKD (chronic kidney disease) stage 3, GFR 30-59 ml/min ([name]) 03/20/2014    Past Surgical History:  Procedure Laterality Date  . HIP ARTHROPLASTY Right 02/05/2013   Procedure: ARTHROPLASTY BIPOLAR HIP;  Surgeon: [name], MD;  Location: [facility];  Service: Orthopedics;  Laterality: Right;  . INTRAMEDULLARY (IM) NAIL INTERTROCHANTERIC Left 06/16/2016   Procedure: INTRAMEDULLARY (IM) NAIL INTERTROCHANTRIC;  Surgeon: Rod Can, MD;  Location: [facility];  Service: Orthopedics;  Laterality: Left;        Home Medications    Prior to Admission medications   Medication Sig Start Date End Date Taking? Authorizing Provider  amLODipine (NORVASC) 10 MG tablet Take 1 tablet (10 mg total) by mouth daily. 05/25/18   Azzie Glatter, [name]  atorvastatin (LIPITOR) 10 MG tablet Take 1 tablet (10 mg total) by mouth daily. 03/21/19   Azzie Glatter, [name]  Blood Glucose Monitoring Suppl (ACCU-CHEK GUIDE) w/Device KIT 1 Device by Does not apply route QID. 11/03/18   Azzie Glatter, [name]  FLUoxetine (PROZAC) 40 MG capsule Take 40 mg by  mouth daily.    [provider]  glipiZIDE (GLUCOTROL) 10 MG tablet Take 1 tablet (10 mg total) by mouth 2 (two) times daily before a meal. 03/16/19   Azzie Glatter, [name]  glucose blood (ACCU-CHEK GUIDE) test strip Use as instructed 11/03/18   Azzie Glatter, [name]  glucose blood (TRUE METRIX BLOOD  GLUCOSE TEST) test strip CHECK BLOOD SUGAR UP TO 4 TIMES A DAY. 05/25/18   Azzie Glatter, [name]  Insulin Glargine (LANTUS SOLOSTAR) 100 UNIT/ML Solostar Pen Inject 50 Units into the skin daily. 03/16/19   Azzie Glatter, [name]  insulin lispro (HUMALOG) 100 UNIT/ML injection Inject 30 units of insulin before meals and at bedtime for glucose levels >125 03/16/19 03/15/20  Azzie Glatter, [name]  metFORMIN (GLUCOPHAGE) 500 MG tablet Take 1 tablet (500 mg total) by mouth 2 (two) times daily with a meal. 03/16/19   Azzie Glatter, [name]  Vitamin D, Ergocalciferol, (DRISDOL) 1.25 MG (50000 UT) CAPS capsule Take 1 capsule (50,000 Units total) by mouth every 7 (seven) days. 03/21/19   Azzie Glatter, [name]    Family History Family History  Problem Relation Age of Onset  . Diabetes Mother   . Alzheimer's disease Mother   . Hypertension Mother   . Hyperlipidemia Father   . Hypertension Father   . Healthy Maternal Grandmother   . Pneumonia Maternal Grandfather     Social History Social History   Tobacco Use  . Smoking status: Current Some Day Smoker    Packs/day: 0.25    Years: 30.00    Pack years: 7.50    Types: Cigarettes    Last attempt to quit: 01/31/2013    Years since quitting: 6.2  . Smokeless tobacco: Never Used  . Tobacco comment: "some days I light one", requests a nicotine patch  Substance Use Topics  . Alcohol use: No  . Drug use: No     Allergies   Patient has no known allergies.   Review of Systems Review of Systems  Constitutional: Negative for chills and fever.  HENT: Negative.   Respiratory: Negative.   Cardiovascular: Negative.   Gastrointestinal: Negative.   Musculoskeletal: Negative.   Skin: Negative.   Neurological: Negative.      Physical Exam Updated Vital Signs BP 139/78 (BP Location: Right Arm)   Pulse 74   Temp 97.6 F (36.4 C) (Oral)   Resp 16   Ht _0  (1.803 m)   Wt 63.5 kg   SpO2 99%   BMI 19.53 kg/m   Physical Exam Vitals signs and  nursing note reviewed.  Constitutional:      General: He is not in acute distress.    Appearance: Normal appearance.     Comments: Patient is pleasant, animated, speaking with loud, slurred speech.   HENT:     Head: Atraumatic.     Mouth/Throat:     Mouth: Mucous membranes are moist.  Neck:     Musculoskeletal: Normal range of motion.  Pulmonary:     Effort: Pulmonary effort is normal.     Breath sounds: No wheezing, rhonchi or rales.  Abdominal:     General: There is no distension.     Palpations: Abdomen is soft.     Tenderness: There is no abdominal tenderness.  Skin:    General: Skin is warm and dry.  Neurological:     General: No focal deficit present.     Mental Status: He is alert.  ED Treatments / Results  Labs (all labs ordered are listed, but only abnormal results are displayed) Labs Reviewed  CBC WITH DIFFERENTIAL/PLATELET  ETHANOL  COMPREHENSIVE METABOLIC PANEL  CBG MONITORING, ED    EKG None  Radiology No results found.  Procedures Procedures (including critical care time) CRITICAL CARE Performed by: Dewaine Oats   Total critical care time: 45 minutes  Critical care time was exclusive of separately billable procedures and treating other patients.  Critical care was necessary to treat or prevent imminent or life-threatening deterioration.  Critical care was time spent personally by me on the following activities: development of treatment plan with patient and/or surrogate as well as nursing, discussions with consultants, evaluation of patient's response to treatment, examination of patient, obtaining history from patient or surrogate, ordering and performing treatments and interventions, ordering and review of laboratory studies, ordering and review of radiographic studies, pulse oximetry and re-evaluation of patient's condition.  Medications Ordered in ED Medications - No data to display   Initial Impression / Assessment and Plan / ED  Course  I have reviewed the triage vital signs and the nursing notes.  Pertinent labs & imaging results that were available during my care of the patient were reviewed by me and considered in my medical decision making (see chart for details).        The patient arrives via EMS reportedly called by neighbor after finding his blood sugar elevated. Possible alcohol use, however, patient denies drinking today. He has no complaints.   Patient's ETOH 180. He is in diabetic ketoacidosis with CO2 of 11, anion gap of 21. IVF boluses begun on arrival. CBG 465. He is started on the glucostabilizer. Potassium supplementation ordered.   11:30 - He will need to be admitted to the hospital for further care. On re-examination, he remains alert and oriented. No complaints. No vomiting.   Final Clinical Impressions(s) / ED Diagnoses   Final diagnoses:  None   1. DKA 2. Alcohol intoxication  ED Discharge Orders    None       Dennie Bible 04/05/75 2831    Delora Fuel, MD 51/76/16 [phone]

## 2019-05-16 NOTE — ED Triage Notes (Signed)
[facility]- pt brought in for hyperglycemia. ETOH found at home. Pt was combative on scene with EMS. Unknown if pt is compliant with insulin.    CBG 520 BP 117/70 HR 80

## 2019-05-17 ENCOUNTER — Observation Stay (HOSPITAL_COMMUNITY): Payer: Medicare Other

## 2019-05-17 DIAGNOSIS — Z96641 Presence of right artificial hip joint: Secondary | ICD-10-CM | POA: Diagnosis present

## 2019-05-17 DIAGNOSIS — E1110 Type 2 diabetes mellitus with ketoacidosis without coma: Secondary | ICD-10-CM | POA: Diagnosis present

## 2019-05-17 DIAGNOSIS — F10129 Alcohol abuse with intoxication, unspecified: Secondary | ICD-10-CM | POA: Diagnosis present

## 2019-05-17 DIAGNOSIS — Z8249 Family history of ischemic heart disease and other diseases of the circulatory system: Secondary | ICD-10-CM | POA: Diagnosis not present

## 2019-05-17 DIAGNOSIS — I5032 Chronic diastolic (congestive) heart failure: Secondary | ICD-10-CM | POA: Diagnosis present

## 2019-05-17 DIAGNOSIS — E785 Hyperlipidemia, unspecified: Secondary | ICD-10-CM | POA: Diagnosis present

## 2019-05-17 DIAGNOSIS — N183 Chronic kidney disease, stage 3 (moderate): Secondary | ICD-10-CM | POA: Diagnosis present

## 2019-05-17 DIAGNOSIS — Z20828 Contact with and (suspected) exposure to other viral communicable diseases: Secondary | ICD-10-CM | POA: Diagnosis present

## 2019-05-17 DIAGNOSIS — E538 Deficiency of other specified B group vitamins: Secondary | ICD-10-CM | POA: Diagnosis present

## 2019-05-17 DIAGNOSIS — Z820 Family history of epilepsy and other diseases of the nervous system: Secondary | ICD-10-CM | POA: Diagnosis not present

## 2019-05-17 DIAGNOSIS — F17210 Nicotine dependence, cigarettes, uncomplicated: Secondary | ICD-10-CM | POA: Diagnosis present

## 2019-05-17 DIAGNOSIS — E1122 Type 2 diabetes mellitus with diabetic chronic kidney disease: Secondary | ICD-10-CM | POA: Diagnosis present

## 2019-05-17 DIAGNOSIS — Z794 Long term (current) use of insulin: Secondary | ICD-10-CM | POA: Diagnosis not present

## 2019-05-17 DIAGNOSIS — Z8349 Family history of other endocrine, nutritional and metabolic diseases: Secondary | ICD-10-CM | POA: Diagnosis not present

## 2019-05-17 DIAGNOSIS — Z833 Family history of diabetes mellitus: Secondary | ICD-10-CM | POA: Diagnosis not present

## 2019-05-17 DIAGNOSIS — Z8673 Personal history of transient ischemic attack (TIA), and cerebral infarction without residual deficits: Secondary | ICD-10-CM | POA: Diagnosis not present

## 2019-05-17 DIAGNOSIS — Z5329 Procedure and treatment not carried out because of patient's decision for other reasons: Secondary | ICD-10-CM | POA: Diagnosis present

## 2019-05-17 DIAGNOSIS — F1010 Alcohol abuse, uncomplicated: Secondary | ICD-10-CM | POA: Diagnosis present

## 2019-05-17 DIAGNOSIS — I130 Hypertensive heart and chronic kidney disease with heart failure and stage 1 through stage 4 chronic kidney disease, or unspecified chronic kidney disease: Secondary | ICD-10-CM | POA: Diagnosis present

## 2019-05-17 LAB — CBC
HCT: 34.3 % — ABNORMAL LOW (ref 39.0–52.0)
Hemoglobin: 11.4 g/dL — ABNORMAL LOW (ref 13.0–17.0)
MCH: 29.5 pg (ref 26.0–34.0)
MCHC: 33.2 g/dL (ref 30.0–36.0)
MCV: 88.9 fL (ref 80.0–100.0)
Platelets: 219 10*3/uL (ref 150–400)
RBC: 3.86 MIL/uL — ABNORMAL LOW (ref 4.22–5.81)
RDW: 13.2 % (ref 11.5–15.5)
WBC: 6.4 10*3/uL (ref 4.0–10.5)
nRBC: 0 % (ref 0.0–0.2)

## 2019-05-17 LAB — POCT I-STAT EG7
Acid-base deficit: 12 mmol/L — ABNORMAL HIGH (ref 0.0–2.0)
Bicarbonate: 12.4 mmol/L — ABNORMAL LOW (ref 20.0–28.0)
Calcium, Ion: 1.1 mmol/L — ABNORMAL LOW (ref 1.15–1.40)
HCT: 34 % — ABNORMAL LOW (ref 39.0–52.0)
Hemoglobin: 11.6 g/dL — ABNORMAL LOW (ref 13.0–17.0)
O2 Saturation: 98 %
Patient temperature: 37
Potassium: 3.6 mmol/L (ref 3.5–5.1)
Sodium: 136 mmol/L (ref 135–145)
TCO2: 13 mmol/L — ABNORMAL LOW (ref 22–32)
pCO2, Ven: 25 mmHg — ABNORMAL LOW (ref 44.0–60.0)
pH, Ven: 7.303 (ref 7.250–7.430)
pO2, Ven: 113 mmHg — ABNORMAL HIGH (ref 32.0–45.0)

## 2019-05-17 LAB — COMPREHENSIVE METABOLIC PANEL
ALT: 12 U/L (ref 0–44)
ALT: 10 U/L (ref 0–44)
AST: 18 U/L (ref 15–41)
AST: 17 U/L (ref 15–41)
Albumin: 3.5 g/dL (ref 3.5–5.0)
Albumin: 3.1 g/dL — ABNORMAL LOW (ref 3.5–5.0)
Alkaline Phosphatase: 69 U/L (ref 38–126)
Alkaline Phosphatase: 59 U/L (ref 38–126)
Anion gap: 15 (ref 5–15)
Anion gap: 11 (ref 5–15)
BUN: 29 mg/dL — ABNORMAL HIGH (ref 8–23)
BUN: 24 mg/dL — ABNORMAL HIGH (ref 8–23)
CO2: 13 mmol/L — ABNORMAL LOW (ref 22–32)
CO2: 15 mmol/L — ABNORMAL LOW (ref 22–32)
Calcium: 8.6 mg/dL — ABNORMAL LOW (ref 8.9–10.3)
Calcium: 8.5 mg/dL — ABNORMAL LOW (ref 8.9–10.3)
Chloride: 107 mmol/L (ref 98–111)
Chloride: 113 mmol/L — ABNORMAL HIGH (ref 98–111)
Creatinine, Ser: 2.19 mg/dL — ABNORMAL HIGH (ref 0.61–1.24)
Creatinine, Ser: 1.72 mg/dL — ABNORMAL HIGH (ref 0.61–1.24)
GFR calc Af Amer: 33 mL/min — ABNORMAL LOW (ref >60)
GFR calc Af Amer: 44 mL/min — ABNORMAL LOW (ref >60)
GFR calc non Af Amer: 29 mL/min — ABNORMAL LOW (ref >60)
GFR calc non Af Amer: 38 mL/min — ABNORMAL LOW (ref >60)
Glucose, Bld: 353 mg/dL — ABNORMAL HIGH (ref 70–99)
Glucose, Bld: 101 mg/dL — ABNORMAL HIGH (ref 70–99)
Potassium: 3.7 mmol/L (ref 3.5–5.1)
Potassium: 4.5 mmol/L (ref 3.5–5.1)
Sodium: 135 mmol/L (ref 135–145)
Sodium: 139 mmol/L (ref 135–145)
Total Bilirubin: 0.4 mg/dL (ref 0.3–1.2)
Total Bilirubin: 0.5 mg/dL (ref 0.3–1.2)
Total Protein: 6.9 g/dL (ref 6.5–8.1)
Total Protein: 6.1 g/dL — ABNORMAL LOW (ref 6.5–8.1)

## 2019-05-17 LAB — URINALYSIS, ROUTINE W REFLEX MICROSCOPIC
Bacteria, UA: NONE SEEN
Bilirubin Urine: NEGATIVE
Glucose, UA: >=500 mg/dL — AB
Ketones, ur: NEGATIVE mg/dL
Leukocytes,Ua: NEGATIVE
Nitrite: NEGATIVE
Protein, ur: 30 mg/dL — AB
Specific Gravity, Urine: 1.011 (ref 1.005–1.030)
pH: 5 (ref 5.0–8.0)

## 2019-05-17 LAB — PROTIME-INR
INR: 1 (ref 0.8–1.2)
Prothrombin Time: 12.7 seconds (ref 11.4–15.2)

## 2019-05-17 LAB — CBG MONITORING, ED
Glucose-Capillary: 388 mg/dL — ABNORMAL HIGH (ref 70–99)
Glucose-Capillary: 418 mg/dL — ABNORMAL HIGH (ref 70–99)

## 2019-05-17 LAB — TSH: TSH: 2.247 u[IU]/mL (ref 0.350–4.500)

## 2019-05-17 LAB — GLUCOSE, CAPILLARY
Glucose-Capillary: 337 mg/dL — ABNORMAL HIGH (ref 70–99)
Glucose-Capillary: 250 mg/dL — ABNORMAL HIGH (ref 70–99)
Glucose-Capillary: 183 mg/dL — ABNORMAL HIGH (ref 70–99)
Glucose-Capillary: 126 mg/dL — ABNORMAL HIGH (ref 70–99)
Glucose-Capillary: 62 mg/dL — ABNORMAL LOW (ref 70–99)
Glucose-Capillary: 82 mg/dL (ref 70–99)
Glucose-Capillary: 108 mg/dL — ABNORMAL HIGH (ref 70–99)
Glucose-Capillary: 75 mg/dL (ref 70–99)
Glucose-Capillary: 71 mg/dL (ref 70–99)
Glucose-Capillary: 75 mg/dL (ref 70–99)
Glucose-Capillary: 156 mg/dL — ABNORMAL HIGH (ref 70–99)

## 2019-05-17 LAB — BETA-HYDROXYBUTYRIC ACID: Beta-Hydroxybutyric Acid: 0.29 mmol/L — ABNORMAL HIGH (ref 0.05–0.27)

## 2019-05-17 LAB — MAGNESIUM
Magnesium: 2.2 mg/dL (ref 1.7–2.4)
Magnesium: 2.2 mg/dL (ref 1.7–2.4)

## 2019-05-17 LAB — PHOSPHORUS
Phosphorus: 4.3 mg/dL (ref 2.5–4.6)
Phosphorus: 3.8 mg/dL (ref 2.5–4.6)

## 2019-05-17 LAB — LACTIC ACID, PLASMA
Lactic Acid, Venous: 5.1 mmol/L (ref 0.5–1.9)
Lactic Acid, Venous: 1.2 mmol/L (ref 0.5–1.9)

## 2019-05-17 LAB — CK: Total CK: 120 U/L (ref 49–397)

## 2019-05-17 LAB — SARS CORONAVIRUS 2 BY RT PCR (HOSPITAL ORDER, PERFORMED IN ~~LOC~~ HOSPITAL LAB): SARS Coronavirus 2: NEGATIVE

## 2019-05-17 LAB — TROPONIN I (HIGH SENSITIVITY)
Troponin I (High Sensitivity): 8 ng/L (ref <18)
Troponin I (High Sensitivity): 9 ng/L (ref <18)

## 2019-05-17 MED ORDER — DEXTROSE 50 % IV SOLN
25.0000 mL | INTRAVENOUS | Status: DC | PRN
  Administered 2019-05-17: 09:00:00 15 mL via INTRAVENOUS
  Filled 2019-05-17: qty 50

## 2019-05-17 MED ORDER — ACETAMINOPHEN 650 MG RE SUPP: 650.0000 mg | Freq: Four times a day (QID) | RECTAL | Status: DC | PRN

## 2019-05-17 MED ORDER — INSULIN REGULAR(HUMAN) IN NACL 100-0.9 UT/100ML-% IV SOLN: INTRAVENOUS | Status: DC

## 2019-05-17 MED ORDER — VITAMIN B-1 100 MG PO TABS: 100.0000 mg | ORAL_TABLET | Freq: Every day | ORAL | Status: DC

## 2019-05-17 MED ORDER — INSULIN GLARGINE 100 UNIT/ML ~~LOC~~ SOLN
25.0000 [IU] | Freq: Every day | SUBCUTANEOUS | Status: DC
  Administered 2019-05-17: 25 [IU] via SUBCUTANEOUS
  Filled 2019-05-17: qty 0.25

## 2019-05-17 MED ORDER — ONDANSETRON HCL 4 MG/2ML IJ SOLN: 4.0000 mg | Freq: Four times a day (QID) | INTRAMUSCULAR | Status: DC | PRN

## 2019-05-17 MED ORDER — ACETAMINOPHEN 325 MG PO TABS: 650.0000 mg | ORAL_TABLET | Freq: Four times a day (QID) | ORAL | Status: DC | PRN

## 2019-05-17 MED ORDER — THIAMINE HCL 100 MG/ML IJ SOLN
100.0000 mg | Freq: Every day | INTRAMUSCULAR | Status: DC
  Administered 2019-05-17: 100 mg via INTRAVENOUS
  Filled 2019-05-17: qty 2

## 2019-05-17 MED ORDER — HYDROCODONE-ACETAMINOPHEN 5-325 MG PO TABS: 1.0000 | ORAL_TABLET | ORAL | Status: DC | PRN

## 2019-05-17 MED ORDER — FOLIC ACID 5 MG/ML IJ SOLN
1.0000 mg | Freq: Every day | INTRAMUSCULAR | Status: DC
  Filled 2019-05-17 (×2): qty 0.2

## 2019-05-17 MED ORDER — SODIUM CHLORIDE 0.9 % IV BOLUS
1000.0000 mL | Freq: Once | INTRAVENOUS | Status: AC
  Administered 2019-05-17: 1000 mL via INTRAVENOUS

## 2019-05-17 MED ORDER — FLUOXETINE HCL 20 MG PO CAPS
40.0000 mg | ORAL_CAPSULE | Freq: Every day | ORAL | Status: DC
  Administered 2019-05-17: 40 mg via ORAL
  Filled 2019-05-17: qty 2

## 2019-05-17 MED ORDER — ONDANSETRON HCL 4 MG PO TABS: 4.0000 mg | ORAL_TABLET | Freq: Four times a day (QID) | ORAL | Status: DC | PRN

## 2019-05-17 MED ORDER — AMLODIPINE BESYLATE 10 MG PO TABS
10.0000 mg | ORAL_TABLET | Freq: Every day | ORAL | Status: DC
  Administered 2019-05-17: 11:00:00 10 mg via ORAL
  Filled 2019-05-17: qty 1

## 2019-05-17 MED ORDER — ATORVASTATIN CALCIUM 10 MG PO TABS
10.0000 mg | ORAL_TABLET | Freq: Every day | ORAL | Status: DC
  Administered 2019-05-17: 10 mg via ORAL
  Filled 2019-05-17: qty 1

## 2019-05-17 MED ORDER — INSULIN REGULAR BOLUS VIA INFUSION
0.0000 [IU] | Freq: Three times a day (TID) | INTRAVENOUS | Status: DC
  Filled 2019-05-17: qty 10

## 2019-05-17 MED ORDER — FOLIC ACID 1 MG PO TABS: 1.0000 mg | ORAL_TABLET | Freq: Every day | ORAL | Status: DC

## 2019-05-17 MED ORDER — SODIUM CHLORIDE 0.9 % IV SOLN
INTRAVENOUS | Status: DC
  Administered 2019-05-17: 12:00:00 via INTRAVENOUS

## 2019-05-17 MED ORDER — INSULIN ASPART 100 UNIT/ML ~~LOC~~ SOLN
0.0000 [IU] | Freq: Three times a day (TID) | SUBCUTANEOUS | Status: DC
  Administered 2019-05-17: 17:00:00 2 [IU] via SUBCUTANEOUS

## 2019-05-17 MED ORDER — INSULIN ASPART 100 UNIT/ML ~~LOC~~ SOLN: 0.0000 [IU] | Freq: Every day | SUBCUTANEOUS | Status: DC

## 2019-05-17 NOTE — ED Notes (Signed)
ED TO INPATIENT HANDOFF REPORT  ED Nurse Name and Phone #: Oveta Idris [phone]  S Name/Age/Gender [name] [name] 73 y.o. male Room/Bed: 017C/017C  Code Status   Code Status: Prior  Home/SNF/Other Home Patient oriented to: self, place, time and situation Is this baseline? Yes   Triage Complete: Triage complete  Chief Complaint Hyperglycemia; ETOH  Triage Note [facility]- pt brought in for hyperglycemia. ETOH found at home. Pt was combative on scene with EMS. Unknown if pt is compliant with insulin.    CBG 520 BP 117/70 HR 80   Allergies No Known Allergies  Level of Care/Admitting Diagnosis ED Disposition    ED Disposition Condition [facility] Area: [place] [100100]  Level of Care: Progressive [102]  I expect the patient will be discharged within 24 hours: No (not a candidate for 5C-Observation unit)  Covid Evaluation: Asymptomatic Screening Protocol (No Symptoms)  Diagnosis: DKA, type 2 ([place]) [263785]  Admitting Physician: Toy Baker [[id number]]  Attending Physician: Toy Baker [[id number]]  PT Class (Do Not Modify): Observation [104]  PT Acc Code (Do Not Modify): Observation [10022]       B Medical/Surgery History Past Medical History:  Diagnosis Date  . 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 Closed comminuted intertrochanteric fracture of left femur ([place])   . Diabetes mellitus without complication ([place])   . Diabetic hyperosmolar non-ketotic state ([place]) 05/15/2016  . DKA (diabetic ketoacidoses) ([place]) 05/09/2016  . Hypertension   . Postoperative anemia due to acute blood loss 06/18/2016  . Retroperitoneal hematoma 06/18/2016  . Stroke ([place])   . Vitamin B 12 deficiency 06/18/2016   Past Surgical History:  Procedure Laterality Date  . HIP ARTHROPLASTY Right 02/05/2013   Procedure: ARTHROPLASTY BIPOLAR HIP;  Surgeon: [name], MD;  Location: [facility];  Service: Orthopedics;   Laterality: Right;  . INTRAMEDULLARY (IM) NAIL INTERTROCHANTERIC Left 06/16/2016   Procedure: INTRAMEDULLARY (IM) NAIL INTERTROCHANTRIC;  Surgeon: Rod Can, MD;  Location: [facility];  Service: Orthopedics;  Laterality: Left;     A IV Location/Drains/Wounds Patient Lines/Drains/Airways Status   Active Line/Drains/Airways    Name:   Placement date:   Placement time:   Site:   Days:   Peripheral IV 05/16/19 Left;Anterior Forearm   05/16/19    2251    Forearm   1   External Urinary Catheter   05/17/19    0056    -   less than 1          Intake/Output Last 24 hours No intake or output data in the 24 hours ending 05/17/19 0058  Labs/Imaging Results for orders placed or performed during the hospital encounter of 05/16/19 (from the past 48 hour(s))  CBC with Differential     Status: Abnormal   Collection Time: 05/16/19 10:11 PM  Result Value Ref Range   WBC 5.5 4.0 - 10.5 K/uL   RBC 4.15 (L) 4.22 - 5.81 MIL/uL   Hemoglobin 12.4 (L) 13.0 - 17.0 g/dL   HCT 38.5 (L) 39.0 - 52.0 %   MCV 92.8 80.0 - 100.0 fL   MCH 29.9 26.0 - 34.0 pg   MCHC 32.2 30.0 - 36.0 g/dL   RDW 13.4 11.5 - 15.5 %   Platelets 215 150 - 400 K/uL   nRBC 0.0 0.0 - 0.2 %   Neutrophils Relative % 64 %   Neutro Abs 3.5 1.7 - 7.7 K/uL   Lymphocytes Relative 25 %  Lymphs Abs 1.4 0.7 - 4.0 K/uL   Monocytes Relative 8 %   Monocytes Absolute 0.5 0.1 - 1.0 K/uL   Eosinophils Relative 3 %   Eosinophils Absolute 0.2 0.0 - 0.5 K/uL   Basophils Relative 0 %   Basophils Absolute 0.0 0.0 - 0.1 K/uL   Immature Granulocytes 0 %   Abs Immature Granulocytes 0.01 0.00 - 0.07 K/uL    Comment: Performed at [facility], [place] [address] [place], [place] [address]  Ethanol     Status: Abnormal   Collection Time: 05/16/19 10:11 PM  Result Value Ref Range   Alcohol, Ethyl (B) 180 (H) <10 mg/dL    Comment: (NOTE) Lowest detectable limit for serum alcohol is 10 mg/dL. For medical purposes only. Performed at [facility], [place] [address] [place], [place] [address]   Comprehensive metabolic panel     Status: Abnormal   Collection Time: 05/16/19 10:11 PM  Result Value Ref Range   Sodium 131 (L) 135 - 145 mmol/L   Potassium 3.6 3.5 - 5.1 mmol/L   Chloride 99 98 - 111 mmol/L   CO2 11 (L) 22 - 32 mmol/L   Glucose, Bld 526 (HH) 70 - 99 mg/dL    Comment: CRITICAL RESULT CALLED TO, READ BACK BY AND VERIFIED WITH: Elius Etheredge A,RN 05/16/19 2318 WAYK    BUN 31 (H) 8 - 23 mg/dL   Creatinine, Ser 2.61 (H) 0.61 - 1.24 mg/dL   Calcium 9.0 8.9 - 10.3 mg/dL   Total Protein 7.4 6.5 - 8.1 g/dL   Albumin 3.8 3.5 - 5.0 g/dL   AST 15 15 - 41 U/L   ALT 12 0 - 44 U/L   Alkaline Phosphatase 77 38 - 126 U/L   Total Bilirubin 0.4 0.3 - 1.2 mg/dL   GFR calc non Af Amer 23 (L) >60 mL/min   GFR calc Af Amer 27 (L) >60 mL/min   Anion gap 21 (H) 5 - 15    Comment: Performed at [facility] [address], [place], [place] [address]  CBG monitoring, ED     Status: Abnormal   Collection Time: 05/16/19 10:14 PM  Result Value Ref Range   Glucose-Capillary 465 (H) 70 - 99 mg/dL   Comment 1 Document in Chart   CBG monitoring, ED     Status: Abnormal   Collection Time: 05/17/19 12:14 AM  Result Value Ref Range   Glucose-Capillary 418 (H) 70 - 99 mg/dL   Dg Chest Port 1 View  Result Date: 05/17/2019 CLINICAL DATA:  Diabetic, hyperglycemia. EXAM: PORTABLE CHEST 1 VIEW COMPARISON:  01/27/2019 FINDINGS: Heart and mediastinal contours are within normal limits. No focal opacities or effusions. No acute bony abnormality. IMPRESSION: No active disease. Electronically Signed   By: Rolm Baptise M.D.   On: 05/17/2019 00:40    Pending Labs Unresulted Labs (From admission, onward)    Start     Ordered   05/17/19 0047  SARS CORONAVIRUS 2 Nasal Swab Aptima Multi Swab  (Asymptomatic Patients Labs)  Once,   STAT    Question Answer Comment  Is this test for diagnosis or screening Screening   Symptomatic for COVID-19 as defined by  CDC No   Hospitalized for COVID-19 No   Admitted to ICU for COVID-19 No   Previously tested for COVID-19 Yes   Resident in a congregate (group) care setting No   Employed in healthcare setting No      05/17/19 0046  05/17/19 0036  CK  Add-on,   AD     05/17/19 0035   05/17/19 0036  Protime-INR  Once,   STAT     05/17/19 0035   05/17/19 0035  Magnesium  Add-on,   AD     05/17/19 0035   05/17/19 0035  Phosphorus  Add-on,   AD     05/17/19 0035   05/17/19 0029  Lactic acid, plasma  Once,   STAT     05/17/19 0028   05/17/19 0023  Urinalysis, Routine w reflex microscopic  Once,   STAT     05/17/19 0022   05/17/19 0023  Beta-hydroxybutyric acid  Once,   STAT     05/17/19 0022   05/16/19 2341  SARS Coronavirus 2 [facility] order, Performed in [facility] hospital lab) Nasopharyngeal Nasopharyngeal Swab  (Asymptomatic Patients Labs)  Once,   STAT    Question Answer Comment  Is this test for diagnosis or screening Screening   Symptomatic for COVID-19 as defined by CDC No   Hospitalized for COVID-19 No   Admitted to ICU for COVID-19 No   Previously tested for COVID-19 No   Resident in a congregate (group) care setting No   Employed in healthcare setting No      05/16/19 2340   Signed and Held  Magnesium  Tomorrow morning,   R    Comments: Call MD if <1.5    Signed and Held   Signed and Held  Phosphorus  Tomorrow morning,   R     Signed and Held   Signed and Held  TSH  Once,   R    Comments: Cancel if already done within 1 month and notify MD    Signed and Held   Signed and Held  Comprehensive metabolic panel  Once,   R    Comments: Cal MD for K<3.5 or >5.0    Signed and Held   Signed and Held  CBC  Once,   R    Comments: Call for hg <8.0    Signed and Held          Vitals/Pain Today's Vitals   05/16/19 2202 05/16/19 2236 05/16/19 2246 05/17/19 0045  BP:   118/66 (!) 151/84  Pulse:  66 64 65  Resp:  14 20 20   Temp:      TempSrc:      SpO2:  97% 95% 97%  Weight:  63.5 kg     Height: 5\' 11"  (1.803 m)     PainSc: 0-No pain       Isolation Precautions Droplet precaution  Medications Medications  dextrose 5 %-0.45 % sodium chloride infusion (has no administration in time range)  insulin regular, human (MYXREDLIN) 100 units/ 100 mL infusion (3.6 Units/hr Intravenous New Bag/Given 05/17/19 0026)  potassium chloride 10 mEq in 100 mL IVPB (10 mEq Intravenous New Bag/Given 05/17/19 0027)  sodium chloride 0.9 % bolus 1,000 mL (1,000 mLs Intravenous New Bag/Given 05/16/19 2259)    Mobility walks Moderate fall risk   Focused Assessments Cardiac Assessment Handoff:    Lab Results  Component Value Date   CKTOTAL 100 06/27/2017   TROPONINI <0.03 01/27/2019   No results found for: DDIMER Does the Patient currently have chest pain? No     R Recommendations: See Admitting Provider Note  Report given to:   Additional Notes: Glucose stabilizer.

## 2019-05-17 NOTE — NC FL2 (Signed)
[place] LEVEL OF CARE SCREENING TOOL     IDENTIFICATION  Patient Name: [name] [name] Birthdate: Jul 24, 1946 Sex: male Admission Date (Current Location): 05/16/2019  [facility] and [place] Number:  Herbalist and Address:  [facility], [place] [address] [place], [place] [address]      Provider Number: [id number]  Attending Physician Name and Address:  Shelly Coss, MD  Relative Name and Phone Number:  [name] sister, [phone]    Current Level of Care: Hospital Recommended Level of Care: [place] Prior Approval Number:    Date Approved/Denied:   PASRR Number: [id number]  Discharge Plan: SNF    Current Diagnoses: Patient Active Problem List   Diagnosis Date Noted  . ETOH abuse 05/17/2019  . Chronic diastolic CHF (congestive heart failure) ([place]) 05/17/2019  . Fall 10/03/2018  . Uncontrolled type 2 diabetes mellitus with hyperglycemia, with long-term current use of insulin ([place]) 10/03/2018  . Chest wall contusion 10/03/2018  . Underweight 11/18/2017  . Normocytic anemia 11/18/2017  . DKA, type 2 ([place]) 08/01/2017  . History of cerebrovascular accident (CVA) in adulthood 07/25/2017  . HTN (hypertension) 07/25/2017  . Depression with anxiety 07/25/2017  . Left ventricular diastolic dysfunction 16/10/930  . Dementia ([place]) 07/25/2017  . Brittle diabetes mellitus ([place]) 06/28/2017  . Diabetes mellitus, insulin dependent (IDDM), controlled ([place]) 06/27/2017  . Hypertension 06/27/2017  . Hyperkalemia 01/16/2017  . Weakness 11/16/2016  . Acute renal failure superimposed on stage 3 chronic kidney disease ([place])   . Noncompliance with medication regimen 08/17/2016  . History of DVT (deep vein thrombosis) 07/06/2016  . Vitamin B 12 deficiency 06/18/2016  . Hyperglycemia 05/15/2016  . Diabetic hyperosmolar non-ketotic state ([place]) 05/15/2016  . Hyponatremia 05/15/2016  . Anxiety 05/15/2016  . Narcotic dependency,  continuous ([place]) 11/02/2015  . Uncontrolled type 2 diabetes mellitus with diabetic nephropathy, with long-term current use of insulin ([place]) 08/12/2015  . Depression 08/12/2015  . CKD (chronic kidney disease) stage 3, GFR 30-59 ml/min ([name]) 03/20/2014    Orientation RESPIRATION BLADDER Height & Weight     Self, Time, Situation, Place  Normal External catheter, Incontinent(placed 05/17/19) Weight: 143 lb 15.4 oz (65.3 kg) Height:  5\' 10"  (177.8 cm)  BEHAVIORAL SYMPTOMS/MOOD NEUROLOGICAL BOWEL NUTRITION STATUS      Continent Diet(carb modified, thin liquids)  AMBULATORY STATUS COMMUNICATION OF NEEDS Skin   Limited Assist Verbally Normal                       Personal Care Assistance Level of Assistance  Bathing, Dressing, Feeding Bathing Assistance: Limited assistance Feeding assistance: Independent Dressing Assistance: Limited assistance     Functional Limitations Info  Sight, Hearing, Speech Sight Info: Adequate Hearing Info: Adequate Speech Info: Adequate    SPECIAL CARE FACTORS FREQUENCY  PT (By licensed PT), OT (By licensed OT)     PT Frequency: 5x OT Frequency: 5x            Contractures Contractures Info: Not present    Additional Factors Info  Code Status, Allergies Code Status Info: Full Code Allergies Info: No known allergies           Current Medications (05/17/2019):  This is the current hospital active medication list Current Facility-Administered Medications  Medication Dose Route Frequency Provider Last Rate Last Dose  . 0.9 %  sodium chloride infusion   Intravenous Continuous Shelly Coss, MD 125 mL/hr at 05/17/19 1212    . acetaminophen (TYLENOL)  tablet 650 mg  650 mg Oral Q6H PRN Toy Baker, MD       Or  . acetaminophen (TYLENOL) suppository 650 mg  650 mg Rectal Q6H PRN Doutova, Anastassia, MD      . amLODipine (NORVASC) tablet 10 mg  10 mg Oral Daily Doutova, Anastassia, MD   10 mg at 05/17/19 1042  . atorvastatin (LIPITOR)  tablet 10 mg  10 mg Oral Daily Doutova, Anastassia, MD   10 mg at 05/17/19 1043  . dextrose 50 % solution 25 mL  25 mL Intravenous PRN Toy Baker, MD   15 mL at 05/17/19 0834  . FLUoxetine (PROZAC) capsule 40 mg  40 mg Oral Daily Doutova, Anastassia, MD   40 mg at 05/17/19 1043  . [START ON 4/0/8144] folic acid (FOLVITE) tablet 1 mg  1 mg Oral Daily Adhikari, Amrit, MD      . HYDROcodone-acetaminophen (NORCO/VICODIN) 5-325 MG per tablet 1-2 tablet  1-2 tablet Oral Q4H PRN Doutova, Anastassia, MD      . insulin aspart (novoLOG) injection 0-5 Units  0-5 Units Subcutaneous QHS Adhikari, Amrit, MD      . insulin aspart (novoLOG) injection 0-9 Units  0-9 Units Subcutaneous TID WC Adhikari, Amrit, MD      . insulin glargine (LANTUS) injection 25 Units  25 Units Subcutaneous Daily Shelly Coss, MD   25 Units at 05/17/19 1058  . ondansetron (ZOFRAN) tablet 4 mg  4 mg Oral Q6H PRN Toy Baker, MD       Or  . ondansetron (ZOFRAN) injection 4 mg  4 mg Intravenous Q6H PRN Toy Baker, MD      . Derrill Memo ON 05/18/2019] thiamine (VITAMIN B-1) tablet 100 mg  100 mg Oral Daily Shelly Coss, MD         Discharge Medications: Please see discharge summary for a list of discharge medications.  Relevant Imaging Results:  Relevant Lab Results:   Additional Information [id number]  Eileen Stanford, LCSW

## 2019-05-17 NOTE — Progress Notes (Signed)
PROGRESS NOTE    [name] [name]  ZOX:[id number] DOB: Apr 16, 1946 DOA: 05/16/2019 PCP: Azzie Glatter, [name]   Brief Narrative:  Patient is a 21 male with history of alcohol abuse, diabetes mellitus, hyperlipidemia, stroke, hypertension, retroperitoneal hematoma, vitamin B12 deficiency who presented with altered mental status, hyperglycemia, intoxication.  He was found to be in DKA and admitted for further management.  Assessment & Plan:   Active Problems:   CKD (chronic kidney disease) stage 3, GFR 30-59 ml/min ([name])   Uncontrolled type 2 diabetes mellitus with diabetic nephropathy, with long-term current use of insulin ([name])   HTN (hypertension)   DKA, type 2 ([name])   ETOH abuse   Chronic diastolic CHF (congestive heart failure) ([place])   DKA (diabetic ketoacidoses) ([place])   DKA: Has history of uncontrolled diabetes type 2.  Presented with DKA.  Started on insulin drip.  Currently gap is closed.  Started on diet , long and short acting insulin.  Diabetic coordinator following. Patient says he is not sure how much insulin he takes .  He said he gets insulin from his sister.  He thinks he takes 5 units a day.  History of noncompliance.  Hemoglobin A1c was 15.3 on 03/16/2019.  Continue gentle IV fluids.  Altered mental status: Confused on presentation.  Most likely associated with hyperglycemia.  Currently alert and oriented to place and person.  Hypertension: Current blood pressure stable.  Continue current medicines  CKD stage III: His baseline creatinine ranged from 1.6-1.7.  Currently kidney function on baseline  Ethanol abuse: Started on CIWA protocol.  Started on thiamine folic acid.  Counseled for consultation.  Has history of chronic alcohol abuse  Chronic diastolic CHF: Currently appears dehydrated.  Continue gentle IV fluids.  Since patient is still confused, diet started and not sure whether he will tolerate,we  will move him to inpatient.  He needs IV fluids.  Also waiting for  physical therapy assessment          DVT prophylaxis:SCD Code Status: Full Family Communication: Sister on phone Disposition Plan: likely home tomorrow   Consultants: None  Procedures:None  Antimicrobials:  Anti-infectives (From admission, onward)   None      Subjective:  Patient seen and examined at bedside this afternoon.  Hemodynamically stable.  Currently awake and alert but little confused.  He does not know how much insulin he takes but he thinks that he takes 5 units at home and this is he does not have authority on insulin and his sisters give insulin.  Called sister Jourdon Zimmerle.  She is very frustrated with his brother.  She is concerned about her alcohol habit.  She says he does not take care for himself.  She was concerned about giving her Lantus because he is not eating that much.  She says currently he is on both 70/30 and Lantus.  Objective: Vitals:   05/17/19 0220 05/17/19 0754 05/17/19 1042 05/17/19 1155  BP: 133/82 133/77 (!) 143/93 129/80  Pulse: 70 67  69  Resp: 20 (!) 24  20  Temp:  98.4 F (36.9 C)  98.5 F (36.9 C)  TempSrc:  Oral  Oral  SpO2: 99% 98%  99%  Weight:      Height:        Intake/Output Summary (Last 24 hours) at 05/17/2019 1413 Last data filed at 05/17/2019 1212 Gross per 24 hour  Intake 1805.14 ml  Output 900 ml  Net 905.14 ml   Filed Weights   05/16/19 2202  05/17/19 0145  Weight: 63.5 kg 65.3 kg    Examination:  General exam: Not in distress,thin built HEENT:PERRL,Oral mucosa moist, Ear/Nose normal on gross exam Respiratory system: Bilateral equal air entry, normal vesicular breath sounds, no wheezes or crackles  Cardiovascular system: S1 & S2 heard, RRR. No JVD, murmurs, rubs, gallops or clicks. No pedal edema. Gastrointestinal system: Abdomen is nondistended, soft and nontender. No organomegaly or masses felt. Normal bowel sounds heard. Central nervous system: Alert and awake.  Oriented to place and person .no focal  neurological deficits. Extremities: No edema, no clubbing ,no cyanosis, distal peripheral pulses palpable. Skin: No rashes, lesions or ulcers,no icterus ,no pallor   Data Reviewed: I have personally reviewed following labs and imaging studies  CBC: Recent Labs  Lab 05/16/19 2211 05/17/19 0119 05/17/19 0300  WBC 5.5  --  6.4  NEUTROABS 3.5  --   --   HGB 12.4* 11.6* 11.4*  HCT 38.5* 34.0* 34.3*  MCV 92.8  --  88.9  PLT 215  --  809   Basic Metabolic Panel: Recent Labs  Lab 05/16/19 2211 05/17/19 0111 05/17/19 0119 05/17/19 0300 05/17/19 0913  NA 131*  --  136 135 139  K 3.6  --  3.6 3.7 4.5  CL 99  --   --  107 113*  CO2 11*  --   --  13* 15*  GLUCOSE 526*  --   --  353* 101*  BUN 31*  --   --  29* 24*  CREATININE 2.61*  --   --  2.19* 1.72*  CALCIUM 9.0  --   --  8.6* 8.5*  MG  --  2.2  --  2.2  --   PHOS  --  4.3  --  3.8  --    GFR: Estimated Creatinine Clearance: 34.8 mL/min (A) (by C-G formula based on SCr of 1.72 mg/dL (H)). Liver Function Tests: Recent Labs  Lab 05/16/19 2211 05/17/19 0300 05/17/19 0913  AST 15 18 17   ALT 12 12 10   ALKPHOS 77 69 59  BILITOT 0.4 0.4 0.5  PROT 7.4 6.9 6.1*  ALBUMIN 3.8 3.5 3.1*   No results for input(s): LIPASE, AMYLASE in the last 168 hours. No results for input(s): AMMONIA in the last 168 hours. Coagulation Profile: Recent Labs  Lab 05/17/19 0112  INR 1.0   Cardiac Enzymes: Recent Labs  Lab 05/17/19 0111  CKTOTAL 120   BNP (last 3 results) No results for input(s): PROBNP in the last 8760 hours. HbA1C: No results for input(s): HGBA1C in the last 72 hours. CBG: Recent Labs  Lab 05/17/19 0829 05/17/19 0854 05/17/19 0959 05/17/19 1058 05/17/19 1153  GLUCAP 62* 108* 75 71 75   Lipid Profile: No results for input(s): CHOL, HDL, LDLCALC, TRIG, CHOLHDL, LDLDIRECT in the last 72 hours. Thyroid Function Tests: Recent Labs    05/17/19 0300  TSH 2.247   Anemia Panel: No results for input(s):  VITAMINB12, FOLATE, FERRITIN, TIBC, IRON, RETICCTPCT in the last 72 hours. Sepsis Labs: Recent Labs  Lab 05/17/19 0112 05/17/19 1155  LATICACIDVEN 5.1* 1.2    Recent Results (from the past 240 hour(s))  SARS Coronavirus 2 [facility] order, Performed in [facility] hospital lab) Nasopharyngeal Nasopharyngeal Swab     Status: None   Collection Time: 05/17/19 12:10 AM   Specimen: Nasopharyngeal Swab  Result Value Ref Range Status   SARS Coronavirus 2 NEGATIVE NEGATIVE Final    Comment: (NOTE) If result is NEGATIVE SARS-CoV-2 target nucleic  acids are NOT DETECTED. The SARS-CoV-2 RNA is generally detectable in upper and lower  respiratory specimens during the acute phase of infection. The lowest  concentration of SARS-CoV-2 viral copies this assay can detect is 250  copies / mL. A negative result does not preclude SARS-CoV-2 infection  and should not be used as the sole basis for treatment or other  patient management decisions.  A negative result may occur with  improper specimen collection / handling, submission of specimen other  than nasopharyngeal swab, presence of viral mutation(s) within the  areas targeted by this assay, and inadequate number of viral copies  (<250 copies / mL). A negative result must be combined with clinical  observations, patient history, and epidemiological information. If result is POSITIVE SARS-CoV-2 target nucleic acids are DETECTED. The SARS-CoV-2 RNA is generally detectable in upper and lower  respiratory specimens dur ing the acute phase of infection.  Positive  results are indicative of active infection with SARS-CoV-2.  Clinical  correlation with patient history and other diagnostic information is  necessary to determine patient infection status.  Positive results do  not rule out bacterial infection or co-infection with other viruses. If result is PRESUMPTIVE POSTIVE SARS-CoV-2 nucleic acids MAY BE PRESENT.   A presumptive positive result was  obtained on the submitted specimen  and confirmed on repeat testing.  While 2019 novel coronavirus  (SARS-CoV-2) nucleic acids may be present in the submitted sample  additional confirmatory testing may be necessary for epidemiological  and / or clinical management purposes  to differentiate between  SARS-CoV-2 and other Sarbecovirus currently known to infect humans.  If clinically indicated additional testing with an alternate test  methodology [phone]) is advised. The SARS-CoV-2 RNA is generally  detectable in upper and lower respiratory sp ecimens during the acute  phase of infection. The expected result is Negative. Fact Sheet for Patients:  StrictlyIdeas.no Fact Sheet for Healthcare Providers: BankingDealers.co.za This test is not yet approved or cleared by the [place] FDA and has been authorized for detection and/or diagnosis of SARS-CoV-2 by FDA under an Emergency Use Authorization (EUA).  This EUA will remain in effect (meaning this test can be used) for the duration of the COVID-19 declaration under Section 564(b)(1) of the Act, 21 U.S.C. section 360bbb-3(b)(1), unless the authorization is terminated or revoked sooner. Performed at [facility], [place] [address] [place], [place] [address]          Radiology Studies: Dg Chest Port 1 View  Result Date: 05/17/2019 CLINICAL DATA:  Diabetic, hyperglycemia. EXAM: PORTABLE CHEST 1 VIEW COMPARISON:  01/27/2019 FINDINGS: Heart and mediastinal contours are within normal limits. No focal opacities or effusions. No acute bony abnormality. IMPRESSION: No active disease. Electronically Signed   By: Rolm Baptise M.D.   On: 05/17/2019 00:40        Scheduled Meds: . amLODipine  10 mg Oral Daily  . atorvastatin  10 mg Oral Daily  . FLUoxetine  40 mg Oral Daily  . [START ON 04/15/2594] folic acid  1 mg Oral Daily  . insulin aspart  0-5 Units Subcutaneous QHS  . insulin aspart   0-9 Units Subcutaneous TID WC  . insulin glargine  25 Units Subcutaneous Daily  . [START ON 05/18/2019] thiamine  100 mg Oral Daily   Continuous Infusions: . sodium chloride 125 mL/hr at 05/17/19 1212     LOS: 0 days    Time spent: More than 50% of that time was spent in counseling and/or  coordination of care.      Shelly Coss, MD [facility] Pager [phone]  If 7PM-7AM, please contact night-coverage [email or web address] Password [email or web address] 05/17/2019, 2:13 PM

## 2019-05-17 NOTE — Progress Notes (Signed)
CRITICAL VALUE ALERT  Critical Value: Lactic acid- 5.1  Date & Time Notied:  05/17/19 0237  Provider Notified: Kennon Holter  Orders Received/Actions taken: per protocol

## 2019-05-17 NOTE — Progress Notes (Signed)
PT Cancellation Note  Patient Details Name: [name] [name] MRN: [id number] DOB: Dec 06, 1945   Cancelled Treatment:    Reason Eval/Treat Not Completed: (P) Medical issues which prohibited therapy Pt with low CBG and RN request hold therapy until diet order placed and pt eats. PT will follow back later this afternoon.   Ireland Chagnon B. Migdalia Dk PT, DPT Acute Rehabilitation Services Pager [phone] Office [phone]    [place] 05/17/2019, 10:09 AM

## 2019-05-17 NOTE — Progress Notes (Signed)
CBG at 0720 is 82. CBG did not cross over into Epic due to issue with meter. Insulin drip adjusted per glucostabilzer and verified with Nira Conn, RN

## 2019-05-17 NOTE — Evaluation (Signed)
Physical Therapy Evaluation Patient Details Name: [name] [name] MRN: [id number] DOB: [birth year] Today's Date: 05/17/2019   History of Present Illness  73 y.o. male with medical history significant of ETOH, DM2, hyperlipidemia History of stroke.  Hypertension.  History of retroperitoneal hematoma.  Vitamin B12 deficiency. EMS called by neighbor due to intoxication with CBG >500. Pt can not recall when he took his insulin. Admitted for observation 05/16/19.   Clinical Impression  PTA pt reports living independently in apartment, ambulating community distances without AD, taking bus to get groceries. Pt reports his sister checks on him everyday. Pt is tearful when he reports his daughter used to be the one to check on him everyday but she passed away earlier this year. Pt is currently limited in safe mobility by decreased strength and balance. Pt is modI for bed mobility and min guard for transfers and ambulation with RW. From a mobility standpoint pt would benefit from use of his RW and HHPT to work on balance and strength, however from notes it appears pt needs assist with his medications and would possible benefit from counseling for loss of his daughter. It is highly likely that pt will reject [facility] services. PT will continue to follow acutely.     Follow Up Recommendations Home health PT;Supervision - Intermittent    Equipment Recommendations  None recommended by PT       Precautions / Restrictions Precautions Precautions: Fall Precaution Comments: hx of falling  Restrictions Weight Bearing Restrictions: No      Mobility  Bed Mobility Overal bed mobility: Modified Independent             General bed mobility comments: increased time and effort to get to EoB  Transfers Overall transfer level: Needs assistance Equipment used: Rolling walker (2 wheeled) Transfers: Sit to/from Stand Sit to Stand: Min guard         General transfer comment: min guard for safety with power up and  steadying  Ambulation/Gait Ambulation/Gait assistance: Min guard Gait Distance (Feet): 130 Feet Assistive device: Rolling walker (2 wheeled) Gait Pattern/deviations: Step-through pattern;Decreased step length - right;Decreased step length - left;Drifts right/left;Narrow base of support Gait velocity: slowed Gait velocity interpretation: 1.31 - 2.62 ft/sec, indicative of limited community ambulator General Gait Details: hands on min guard for safety, slow, mildly unsteady gait with no overt LoB, vc for proximity to RW, upright posture and keeping RW on floor     Balance Overall balance assessment: Needs assistance Sitting-balance support: Feet supported;No upper extremity supported Sitting balance-Leahy Scale: Fair     Standing balance support: No upper extremity supported;During functional activity;Bilateral upper extremity supported Standing balance-Leahy Scale: Fair Standing balance comment: able to static stand without UE support requires at least single UE support with dynamic activity                             Pertinent Vitals/Pain Pain Assessment: No/denies pain    Home Living Family/patient expects to be discharged to:: Private residence Living Arrangements: Alone Available Help at Discharge: Family;Available PRN/intermittently Type of Home: Apartment Home Access: Elevator     Home Layout: One level Home Equipment: Walker - 2 wheels      Prior Function Level of Independence: Independent         Comments: reports independence with maobility, takes bus to get groceries     Hand Dominance        Extremity/Trunk Assessment   Upper Extremity Assessment Upper  Extremity Assessment: Generalized weakness    Lower Extremity Assessment Lower Extremity Assessment: Generalized weakness       Communication   Communication: Expressive difficulties(slurred speech)  Cognition Arousal/Alertness: Awake/alert Behavior During Therapy: WFL for tasks  assessed/performed Overall Cognitive Status: Impaired/Different from baseline Area of Impairment: Safety/judgement;Problem solving                         Safety/Judgement: Decreased awareness of safety;Decreased awareness of deficits   Problem Solving: Slow processing;Difficulty sequencing;Requires verbal cues;Requires tactile cues General Comments: require vc for sequencing      General Comments General comments (skin integrity, edema, etc.): VSS,     Exercises     Assessment/Plan    PT Assessment Patient needs continued PT services  PT Problem List Decreased strength;Decreased activity tolerance;Decreased balance;Decreased mobility;Decreased coordination;Decreased safety awareness;Decreased knowledge of use of DME       PT Treatment Interventions DME instruction;Gait training;Functional mobility training;Therapeutic activities;Therapeutic exercise;Balance training;Cognitive remediation    PT Goals (Current goals can be found in the Care Plan section)  Acute Rehab PT Goals Patient Stated Goal: go home PT Goal Formulation: With patient Time For Goal Achievement: 05/31/19 Potential to Achieve Goals: Fair    Frequency Min 3X/week   Barriers to discharge Decreased caregiver support         AM-PAC PT "6 Clicks" Mobility  Outcome Measure Help needed turning from your back to your side while in a flat bed without using bedrails?: None Help needed moving from lying on your back to sitting on the side of a flat bed without using bedrails?: None Help needed moving to and from a bed to a chair (including a wheelchair)?: A Little Help needed standing up from a chair using your arms (e.g., wheelchair or bedside chair)?: A Little Help needed to walk in hospital room?: A Little Help needed climbing 3-5 steps with a railing? : A Lot 6 Click Score: 19    End of Session Equipment Utilized During Treatment: Gait belt Activity Tolerance: Patient tolerated treatment  well Patient left: in bed;with call bell/phone within reach;with bed alarm set Nurse Communication: Mobility status PT Visit Diagnosis: Unsteadiness on feet (R26.81);Other abnormalities of gait and mobility (R26.89);Muscle weakness (generalized) (M62.81);Difficulty in walking, not elsewhere classified (R26.2);History of falling (Z91.81);Repeated falls (R29.6)    Time: 7510-2585 PT Time Calculation (min) (ACUTE ONLY): 27 min   Charges:   PT Evaluation $PT Eval Moderate Complexity: 1 Mod PT Treatments $Gait Training: 8-22 mins        Ottavio Norem B. Migdalia Dk PT, DPT Acute Rehabilitation Services Pager [phone] Office [phone]   [place] 05/17/2019, 3:06 PM

## 2019-05-17 NOTE — Progress Notes (Addendum)
Inpatient Diabetes Program Recommendations  AACE/ADA: New Consensus Statement on Inpatient Glycemic Control (2015)  Target Ranges:  Prepandial:   less than 140 mg/dL      Peak postprandial:   less than 180 mg/dL (1-2 hours)      Critically ill patients:  140 - 180 mg/dL   Lab Results  Component Value Date   GLUCAP 75 05/17/2019   HGBA1C 15.3 (H) 03/16/2019    Review of Glycemic Control Results for [name] [name], [name] [name] (MRN [id number]) as of 05/17/2019 12:14  Ref. Range 05/16/2019 22:14 05/17/2019 00:14 05/17/2019 01:20  Glucose-Capillary Latest Ref Range: 70 - 99 mg/dL 465 (H) 418 (H) 388 (H)   Diabetes history: DM 2 Outpatient Diabetes medications:  Humalog 30 units tid with meals and HS (patient not taking) Glucotrol 10 mg bid (not taking) Metformin 500 mg bid (not taking) Lantus 50 units daily Novolog 15 units q HS Current orders for Inpatient glycemic control:  Novolog sensitive tid with meals and HS Lantus 25 units daily  Inpatient Diabetes Program Recommendations:     Agree with current orders.   Review of chart completed.  Note that patient was discharged from hospital on 02/01/19 on 70/30 20 units with breakfast and 15 units with supper (Patient was d/c'd to SNF).  - It then appears that patient saw PCP on 6/3 and insulins were changed to Lantus 50 units q HS and Humalog 30 units tid with meals.  He definitely does not need Lantus and 70/30 at d/c.  I am concerned about patients ability to self administer insulin and care for self.  During last hospitalization, Diabetes coordinator spoke with patients family however at that time they were unable to give full assistance in administration of medications and checking CBG's, etc.    - 12:30p Spoke with patient by phone.  He seemed appropriate however upon further conversation, he does seem confused.  Patient was in the hospital in April of 2020 and was d/c'd to [facility].  I asked patient when he was d/c'd from the nursing home and he  stated "3-4 years ago".  I asked a different way and he stated again that it had been 3-4 years since he was in the hospital.  He also states that he takes the "gray pen"- 5 units 3 times a day.  This may be the Humalog.  I will call sister to get clarification of what he is taking at home.    Adah Perl, RN, BC-ADM Inpatient Diabetes Coordinator Pager [phone]   14:30 Spoke with patient's sister by phone.  She states that patient cannot give his own insulin.  Therefore [name] gives Lantus in the morning.  She states that patient gets between 5-15 units of Lantus in the mornings.  Then in the evenings, [name] gives him approximately 15 units of 70/30.  She states that patient is not safe to give insulin to himself. She is very frustrated with patient stating "we cannot take care of him".  She states that Lantus 50 units would be "way to much" for him.  Discussed that we would try to get medication doses clarified at d/c.  Agree that patient probably does not need more that Lantus 15-20 units daily.  Again, patient DOES NOT self-administer insulin.

## 2019-05-17 NOTE — Progress Notes (Signed)
Pt is leaving AMA. Pt is AO x 4. Pt informed of the risk involved in leaving the hosp without receiving further care. NP on call was notified and the Charge Nurse.

## 2019-05-18 NOTE — Discharge Summary (Signed)
Physician Discharge Summary  [name] [name] EHM:[id number] DOB: 12-Mar-1946 DOA: 05/16/2019  PCP: Azzie Glatter, [name]  Admit date: 05/16/2019 Discharge date: 05/18/2019  Admitted From: Home Disposition:  Home  Signed AMA  Brief/Interim Summary: Please see my progress note from 05/17/19 for further details.  Discharge Diagnoses:  Active Problems:   CKD (chronic kidney disease) stage 3, GFR 30-59 ml/min ([name])   Uncontrolled type 2 diabetes mellitus with diabetic nephropathy, with long-term current use of insulin ([name])   HTN (hypertension)   DKA, type 2 ([name])   ETOH abuse   Chronic diastolic CHF (congestive heart failure) ([place])   DKA (diabetic ketoacidoses) ([place])    Discharge Instructions   Allergies as of 05/17/2019   No Known Allergies     Medication List    ASK your doctor about these medications   Accu-Chek Guide w/Device Kit 1 Device by Does not apply route QID.   amLODipine 10 MG tablet Commonly known as: NORVASC Take 1 tablet (10 mg total) by mouth daily.   atorvastatin 10 MG tablet Commonly known as: LIPITOR Take 1 tablet (10 mg total) by mouth daily.   FLUoxetine 40 MG capsule Commonly known as: PROZAC Take 40 mg by mouth daily.   glipiZIDE 10 MG tablet Commonly known as: GLUCOTROL Take 1 tablet (10 mg total) by mouth 2 (two) times daily before a meal.   glucose blood test strip Commonly known as: True Metrix Blood Glucose Test CHECK BLOOD SUGAR UP TO 4 TIMES A DAY.   glucose blood test strip Commonly known as: Accu-Chek Guide Use as instructed   Insulin Glargine 100 UNIT/ML Solostar Pen Commonly known as: Lantus SoloStar Inject 50 Units into the skin daily.   insulin lispro 100 UNIT/ML injection Commonly known as: HUMALOG Inject 30 units of insulin before meals and at bedtime for glucose levels >125   metFORMIN 500 MG tablet Commonly known as: GLUCOPHAGE Take 1 tablet (500 mg total) by mouth 2 (two) times daily with a meal.   NovoLOG Mix 70/30  (70-30) 100 UNIT/ML injection Generic drug: insulin aspart protamine- aspart Inject 15 Units into the skin at bedtime.   Vitamin D (Ergocalciferol) 1.25 MG (50000 UT) Caps capsule Commonly known as: DRISDOL Take 1 capsule (50,000 Units total) by mouth every 7 (seven) days.       No Known Allergies  Consultations:     Procedures/Studies: Dg Chest Port 1 View  Result Date: 05/17/2019 CLINICAL DATA:  Diabetic, hyperglycemia. EXAM: PORTABLE CHEST 1 VIEW COMPARISON:  01/27/2019 FINDINGS: Heart and mediastinal contours are within normal limits. No focal opacities or effusions. No acute bony abnormality. IMPRESSION: No active disease. Electronically Signed   By: Rolm Baptise M.D.   On: 05/17/2019 00:40       Subjective:   Discharge Exam: Vitals:   05/17/19 2004 05/17/19 2005  BP: (!) 156/81   Pulse: 76   Resp: (!) 22   Temp: 98.7 F (37.1 C) 98.7 F (37.1 C)  SpO2: 99%    Vitals:   05/17/19 1155 05/17/19 1425 05/17/19 2004 05/17/19 2005  BP: 129/80 134/81 (!) 156/81   Pulse: 69 71 76   Resp: 20 (!) 26 (!) 22   Temp: 98.5 F (36.9 C) 98.1 F (36.7 C) 98.7 F (37.1 C) 98.7 F (37.1 C)  TempSrc: Oral Oral Oral Oral  SpO2: 99% 97% 99%   Weight:      Height:          The results of significant diagnostics from  this hospitalization (including imaging, microbiology, ancillary and laboratory) are listed below for reference.     Microbiology: Recent Results (from the past 240 hour(s))  SARS Coronavirus 2 [facility] order, Performed in [facility] hospital lab) Nasopharyngeal Nasopharyngeal Swab     Status: None   Collection Time: 05/17/19 12:10 AM   Specimen: Nasopharyngeal Swab  Result Value Ref Range Status   SARS Coronavirus 2 NEGATIVE NEGATIVE Final    Comment: (NOTE) If result is NEGATIVE SARS-CoV-2 target nucleic acids are NOT DETECTED. The SARS-CoV-2 RNA is generally detectable in upper and lower  respiratory specimens during the acute phase of infection.  The lowest  concentration of SARS-CoV-2 viral copies this assay can detect is 250  copies / mL. A negative result does not preclude SARS-CoV-2 infection  and should not be used as the sole basis for treatment or other  patient management decisions.  A negative result may occur with  improper specimen collection / handling, submission of specimen other  than nasopharyngeal swab, presence of viral mutation(s) within the  areas targeted by this assay, and inadequate number of viral copies  (<250 copies / mL). A negative result must be combined with clinical  observations, patient history, and epidemiological information. If result is POSITIVE SARS-CoV-2 target nucleic acids are DETECTED. The SARS-CoV-2 RNA is generally detectable in upper and lower  respiratory specimens dur ing the acute phase of infection.  Positive  results are indicative of active infection with SARS-CoV-2.  Clinical  correlation with patient history and other diagnostic information is  necessary to determine patient infection status.  Positive results do  not rule out bacterial infection or co-infection with other viruses. If result is PRESUMPTIVE POSTIVE SARS-CoV-2 nucleic acids MAY BE PRESENT.   A presumptive positive result was obtained on the submitted specimen  and confirmed on repeat testing.  While 2019 novel coronavirus  (SARS-CoV-2) nucleic acids may be present in the submitted sample  additional confirmatory testing may be necessary for epidemiological  and / or clinical management purposes  to differentiate between  SARS-CoV-2 and other Sarbecovirus currently known to infect humans.  If clinically indicated additional testing with an alternate test  methodology [phone]) is advised. The SARS-CoV-2 RNA is generally  detectable in upper and lower respiratory sp ecimens during the acute  phase of infection. The expected result is Negative. Fact Sheet for Patients:   StrictlyIdeas.no Fact Sheet for Healthcare Providers: BankingDealers.co.za This test is not yet approved or cleared by the [place] FDA and has been authorized for detection and/or diagnosis of SARS-CoV-2 by FDA under an Emergency Use Authorization (EUA).  This EUA will remain in effect (meaning this test can be used) for the duration of the COVID-19 declaration under Section 564(b)(1) of the Act, 21 U.S.C. section 360bbb-3(b)(1), unless the authorization is terminated or revoked sooner. Performed at [facility], [place] [address], [place], [place] [address]      Labs: BNP (last 3 results) No results for input(s): BNP in the last 8760 hours. Basic Metabolic Panel: Recent Labs  Lab 05/16/19 2211 05/17/19 0111 05/17/19 0119 05/17/19 0300 05/17/19 0913  NA 131*  --  136 135 139  K 3.6  --  3.6 3.7 4.5  CL 99  --   --  107 113*  CO2 11*  --   --  13* 15*  GLUCOSE 526*  --   --  353* 101*  BUN 31*  --   --  29* 24*  CREATININE 2.61*  --   --  2.19* 1.72*  CALCIUM 9.0  --   --  8.6* 8.5*  MG  --  2.2  --  2.2  --   PHOS  --  4.3  --  3.8  --    Liver Function Tests: Recent Labs  Lab 05/16/19 2211 05/17/19 0300 05/17/19 0913  AST '15 18 17  ' ALT '12 12 10  ' ALKPHOS 77 69 59  BILITOT 0.4 0.4 0.5  PROT 7.4 6.9 6.1*  ALBUMIN 3.8 3.5 3.1*   No results for input(s): LIPASE, AMYLASE in the last 168 hours. No results for input(s): AMMONIA in the last 168 hours. CBC: Recent Labs  Lab 05/16/19 2211 05/17/19 0119 05/17/19 0300  WBC 5.5  --  6.4  NEUTROABS 3.5  --   --   HGB 12.4* 11.6* 11.4*  HCT 38.5* 34.0* 34.3*  MCV 92.8  --  88.9  PLT 215  --  219   Cardiac Enzymes: Recent Labs  Lab 05/17/19 0111  CKTOTAL 120   BNP: Invalid input(s): POCBNP CBG: Recent Labs  Lab 05/17/19 0854 05/17/19 0959 05/17/19 1058 05/17/19 1153 05/17/19 1546  GLUCAP 108* 75 71 75 156*   D-Dimer No results for input(s):  DDIMER in the last 72 hours. Hgb A1c No results for input(s): HGBA1C in the last 72 hours. Lipid Profile No results for input(s): CHOL, HDL, LDLCALC, TRIG, CHOLHDL, LDLDIRECT in the last 72 hours. Thyroid function studies Recent Labs    05/17/19 0300  TSH 2.247   Anemia work up No results for input(s): VITAMINB12, FOLATE, FERRITIN, TIBC, IRON, RETICCTPCT in the last 72 hours. Urinalysis    Component Value Date/Time   COLORURINE STRAW (A) 05/17/2019 0313   APPEARANCEUR CLEAR 05/17/2019 0313   LABSPEC 1.011 05/17/2019 0313   PHURINE 5.0 05/17/2019 0313   GLUCOSEU >=500 (A) 05/17/2019 0313   HGBUR SMALL (A) 05/17/2019 0313   BILIRUBINUR NEGATIVE 05/17/2019 0313   BILIRUBINUR negative 03/16/2019 0937   KETONESUR NEGATIVE 05/17/2019 0313   PROTEINUR 30 (A) 05/17/2019 0313   UROBILINOGEN 0.2 03/16/2019 0937   UROBILINOGEN 0.2 05/12/2017 0939   NITRITE NEGATIVE 05/17/2019 0313   LEUKOCYTESUR NEGATIVE 05/17/2019 0313   Sepsis Labs Invalid input(s): PROCALCITONIN,  WBC,  LACTICIDVEN Microbiology Recent Results (from the past 240 hour(s))  SARS Coronavirus 2 [facility] order, Performed in [facility] hospital lab) Nasopharyngeal Nasopharyngeal Swab     Status: None   Collection Time: 05/17/19 12:10 AM   Specimen: Nasopharyngeal Swab  Result Value Ref Range Status   SARS Coronavirus 2 NEGATIVE NEGATIVE Final    Comment: (NOTE) If result is NEGATIVE SARS-CoV-2 target nucleic acids are NOT DETECTED. The SARS-CoV-2 RNA is generally detectable in upper and lower  respiratory specimens during the acute phase of infection. The lowest  concentration of SARS-CoV-2 viral copies this assay can detect is 250  copies / mL. A negative result does not preclude SARS-CoV-2 infection  and should not be used as the sole basis for treatment or other  patient management decisions.  A negative result may occur with  improper specimen collection / handling, submission of specimen other  than  nasopharyngeal swab, presence of viral mutation(s) within the  areas targeted by this assay, and inadequate number of viral copies  (<250 copies / mL). A negative result must be combined with clinical  observations, patient history, and epidemiological information. If result is POSITIVE SARS-CoV-2 target nucleic acids are DETECTED. The SARS-CoV-2 RNA is generally detectable in upper and lower  respiratory specimens dur ing  the acute phase of infection.  Positive  results are indicative of active infection with SARS-CoV-2.  Clinical  correlation with patient history and other diagnostic information is  necessary to determine patient infection status.  Positive results do  not rule out bacterial infection or co-infection with other viruses. If result is PRESUMPTIVE POSTIVE SARS-CoV-2 nucleic acids MAY BE PRESENT.   A presumptive positive result was obtained on the submitted specimen  and confirmed on repeat testing.  While 2019 novel coronavirus  (SARS-CoV-2) nucleic acids may be present in the submitted sample  additional confirmatory testing may be necessary for epidemiological  and / or clinical management purposes  to differentiate between  SARS-CoV-2 and other Sarbecovirus currently known to infect humans.  If clinically indicated additional testing with an alternate test  methodology [phone]) is advised. The SARS-CoV-2 RNA is generally  detectable in upper and lower respiratory sp ecimens during the acute  phase of infection. The expected result is Negative. Fact Sheet for Patients:  StrictlyIdeas.no Fact Sheet for Healthcare Providers: BankingDealers.co.za This test is not yet approved or cleared by the [place] FDA and has been authorized for detection and/or diagnosis of SARS-CoV-2 by FDA under an Emergency Use Authorization (EUA).  This EUA will remain in effect (meaning this test can be used) for the duration of  the COVID-19 declaration under Section 564(b)(1) of the Act, 21 U.S.C. section 360bbb-3(b)(1), unless the authorization is terminated or revoked sooner. Performed at [facility], [place] [address] [place], [place] [address]     Please note: You were cared for by a hospitalist during your hospital stay. Once you are discharged, your primary care physician will handle any further medical issues. Please note that NO REFILLS for any discharge medications will be authorized once you are discharged, as it is imperative that you return to your primary care physician (or establish a relationship with a primary care physician if you do not have one) for your post hospital discharge needs so that they can reassess your need for medications and monitor your lab values.    Time coordinating discharge: 40 minutes  SIGNED:   Shelly Coss, MD  [facility] 05/18/2019, 2:41 PM Pager [phone]  If 7PM-7AM, please contact night-coverage [email or web address] Password [email or web address]

## 2019-05-20 ENCOUNTER — Other Ambulatory Visit: Payer: Self-pay | Admitting: Family Medicine

## 2019-06-22 ENCOUNTER — Other Ambulatory Visit: Payer: Self-pay | Admitting: Family Medicine

## 2019-06-26 ENCOUNTER — Other Ambulatory Visit: Payer: Self-pay | Admitting: Family Medicine

## 2019-06-26 DIAGNOSIS — I10 Essential (primary) hypertension: Secondary | ICD-10-CM

## 2019-07-26 ENCOUNTER — Other Ambulatory Visit: Payer: Self-pay | Admitting: Family Medicine

## 2019-07-26 DIAGNOSIS — I10 Essential (primary) hypertension: Secondary | ICD-10-CM

## 2019-08-24 ENCOUNTER — Other Ambulatory Visit: Payer: Self-pay | Admitting: Family Medicine

## 2019-08-24 DIAGNOSIS — I10 Essential (primary) hypertension: Secondary | ICD-10-CM

## 2019-09-28 ENCOUNTER — Other Ambulatory Visit: Payer: Self-pay | Admitting: Family Medicine

## 2019-09-28 DIAGNOSIS — I10 Essential (primary) hypertension: Secondary | ICD-10-CM

## 2019-09-28 DIAGNOSIS — E559 Vitamin D deficiency, unspecified: Secondary | ICD-10-CM

## 2019-10-29 ENCOUNTER — Other Ambulatory Visit: Payer: Self-pay | Admitting: Family Medicine

## 2019-10-29 DIAGNOSIS — E559 Vitamin D deficiency, unspecified: Secondary | ICD-10-CM

## 2019-10-29 DIAGNOSIS — I10 Essential (primary) hypertension: Secondary | ICD-10-CM

## 2019-11-02 ENCOUNTER — Other Ambulatory Visit: Payer: Self-pay

## 2019-11-02 ENCOUNTER — Other Ambulatory Visit: Payer: Self-pay | Admitting: Family Medicine

## 2019-11-02 DIAGNOSIS — E1165 Type 2 diabetes mellitus with hyperglycemia: Secondary | ICD-10-CM

## 2019-11-02 DIAGNOSIS — I10 Essential (primary) hypertension: Secondary | ICD-10-CM

## 2019-11-02 DIAGNOSIS — R739 Hyperglycemia, unspecified: Secondary | ICD-10-CM

## 2019-11-02 MED ORDER — INSULIN LISPRO 100 UNIT/ML ~~LOC~~ SOLN: SUBCUTANEOUS | 3 refills | Status: DC

## 2019-11-02 MED ORDER — ATORVASTATIN CALCIUM 10 MG PO TABS: 10.0000 mg | ORAL_TABLET | Freq: Every day | ORAL | 3 refills | Status: DC

## 2019-11-02 MED ORDER — BD PEN NEEDLE SHORT U/F 31G X 8 MM MISC: 99 refills | Status: DC

## 2019-11-02 MED ORDER — LANTUS SOLOSTAR 100 UNIT/ML ~~LOC~~ SOPN: 50.0000 [IU] | PEN_INJECTOR | Freq: Every day | SUBCUTANEOUS | 99 refills | Status: DC

## 2019-11-02 MED ORDER — "INSULIN SYRINGE-NEEDLE U-100 31G X 5/16"" 0.3 ML MISC": 1.0000 | Freq: Two times a day (BID) | 0 refills | Status: DC

## 2019-11-02 MED ORDER — ACCU-CHEK GUIDE W/DEVICE KIT: 1.0000 | PACK | Freq: Four times a day (QID) | 0 refills | Status: DC

## 2019-11-02 MED ORDER — FLUOXETINE HCL 40 MG PO CAPS: 40.0000 mg | ORAL_CAPSULE | Freq: Every day | ORAL | 0 refills | Status: DC

## 2019-11-09 ENCOUNTER — Ambulatory Visit (INDEPENDENT_AMBULATORY_CARE_PROVIDER_SITE_OTHER): Payer: Medicare Other | Admitting: Family Medicine

## 2019-11-09 ENCOUNTER — Encounter: Payer: Self-pay | Admitting: Family Medicine

## 2019-11-09 ENCOUNTER — Other Ambulatory Visit: Payer: Self-pay

## 2019-11-09 VITALS — BP 142/87 | HR 77 | Temp 98.1°F | Ht 70.0 in | Wt 143.0 lb

## 2019-11-09 DIAGNOSIS — E1165 Type 2 diabetes mellitus with hyperglycemia: Secondary | ICD-10-CM | POA: Diagnosis not present

## 2019-11-09 DIAGNOSIS — R7309 Other abnormal glucose: Secondary | ICD-10-CM

## 2019-11-09 DIAGNOSIS — Z794 Long term (current) use of insulin: Secondary | ICD-10-CM | POA: Diagnosis not present

## 2019-11-09 DIAGNOSIS — R739 Hyperglycemia, unspecified: Secondary | ICD-10-CM

## 2019-11-09 DIAGNOSIS — B888 Other specified infestations: Secondary | ICD-10-CM

## 2019-11-09 DIAGNOSIS — E119 Type 2 diabetes mellitus without complications: Secondary | ICD-10-CM

## 2019-11-09 DIAGNOSIS — F419 Anxiety disorder, unspecified: Secondary | ICD-10-CM

## 2019-11-09 DIAGNOSIS — I10 Essential (primary) hypertension: Secondary | ICD-10-CM

## 2019-11-09 DIAGNOSIS — E538 Deficiency of other specified B group vitamins: Secondary | ICD-10-CM

## 2019-11-09 DIAGNOSIS — Z09 Encounter for follow-up examination after completed treatment for conditions other than malignant neoplasm: Secondary | ICD-10-CM

## 2019-11-09 DIAGNOSIS — E559 Vitamin D deficiency, unspecified: Secondary | ICD-10-CM

## 2019-11-09 LAB — POCT URINALYSIS DIPSTICK
Bilirubin, UA: NEGATIVE
Glucose, UA: POSITIVE — AB
Ketones, UA: NEGATIVE
Leukocytes, UA: NEGATIVE
Nitrite, UA: NEGATIVE
Protein, UA: POSITIVE — AB
Spec Grav, UA: 1.025 (ref 1.010–1.025)
Urobilinogen, UA: 0.2 E.U./dL
pH, UA: 7 (ref 5.0–8.0)

## 2019-11-09 LAB — POCT GLYCOSYLATED HEMOGLOBIN (HGB A1C): Hemoglobin A1C: 11.5 % — AB (ref 4.0–5.6)

## 2019-11-09 LAB — GLUCOSE, POCT (MANUAL RESULT ENTRY): POC Glucose: 318 mg/dl — AB (ref 70–99)

## 2019-11-09 MED ORDER — VITAMIN D (ERGOCALCIFEROL) 1.25 MG (50000 UNIT) PO CAPS: 50000.0000 [IU] | ORAL_CAPSULE | ORAL | 6 refills | Status: DC

## 2019-11-09 MED ORDER — FLUOXETINE HCL 40 MG PO CAPS: 40.0000 mg | ORAL_CAPSULE | Freq: Every day | ORAL | 6 refills | Status: DC

## 2019-11-09 NOTE — Patient Instructions (Signed)
DASH Eating Plan DASH stands for "Dietary Approaches to Stop Hypertension." The DASH eating plan is a healthy eating plan that has been shown to reduce high blood pressure (hypertension). It may also reduce your risk for type 2 diabetes, heart disease, and stroke. The DASH eating plan may also help with weight loss. What are tips for following this plan?  General guidelines  Avoid eating more than 2,300 mg (milligrams) of salt (sodium) a day. If you have hypertension, you may need to reduce your sodium intake to 1,500 mg a day.  Limit alcohol intake to no more than 1 drink a day for nonpregnant women and 2 drinks a day for men. One drink equals 12 oz of beer, 5 oz of wine, or 1 oz of hard liquor.  Work with your health care provider to maintain a healthy body weight or to lose weight. Ask what an ideal weight is for you.  Get at least 30 minutes of exercise that causes your heart to beat faster (aerobic exercise) most days of the week. Activities may include walking, swimming, or biking.  Work with your health care provider or diet and nutrition specialist (dietitian) to adjust your eating plan to your individual calorie needs. Reading food labels   Check food labels for the amount of sodium per serving. Choose foods with less than 5 percent of the Daily Value of sodium. Generally, foods with less than 300 mg of sodium per serving fit into this eating plan.  To find whole grains, look for the word "whole" as the first word in the ingredient list. Shopping  Buy products labeled as "low-sodium" or "no salt added."  Buy fresh foods. Avoid canned foods and premade or frozen meals. Cooking  Avoid adding salt when cooking. Use salt-free seasonings or herbs instead of table salt or sea salt. Check with your health care provider or pharmacist before using salt substitutes.  Do not fry foods. Cook foods using healthy methods such as baking, boiling, grilling, and broiling instead.  Cook with  heart-healthy oils, such as olive, canola, soybean, or sunflower oil. Meal planning  Eat a balanced diet that includes: ? 5 or more servings of fruits and vegetables each day. At each meal, try to fill half of your plate with fruits and vegetables. ? Up to 6-8 servings of whole grains each day. ? Less than 6 oz of lean meat, poultry, or fish each day. A 3-oz serving of meat is about the same size as a deck of cards. One egg equals 1 oz. ? 2 servings of low-fat dairy each day. ? A serving of nuts, seeds, or beans 5 times each week. ? Heart-healthy fats. Healthy fats called Omega-3 fatty acids are found in foods such as flaxseeds and coldwater fish, like sardines, salmon, and mackerel.  Limit how much you eat of the following: ? Canned or prepackaged foods. ? Food that is high in trans fat, such as fried foods. ? Food that is high in saturated fat, such as fatty meat. ? Sweets, desserts, sugary drinks, and other foods with added sugar. ? Full-fat dairy products.  Do not salt foods before eating.  Try to eat at least 2 vegetarian meals each week.  Eat more home-cooked food and less restaurant, buffet, and fast food.  When eating at a restaurant, ask that your food be prepared with less salt or no salt, if possible. What foods are recommended? The items listed may not be a complete list. Talk with your dietitian about   what dietary choices are best for you. Grains Whole-grain or whole-wheat bread. Whole-grain or whole-wheat pasta. Brown rice. Oatmeal. Quinoa. Bulgur. Whole-grain and low-sodium cereals. Pita bread. Low-fat, low-sodium crackers. Whole-wheat flour tortillas. Vegetables Fresh or frozen vegetables (raw, steamed, roasted, or grilled). Low-sodium or reduced-sodium tomato and vegetable juice. Low-sodium or reduced-sodium tomato sauce and tomato paste. Low-sodium or reduced-sodium canned vegetables. Fruits All fresh, dried, or frozen fruit. Canned fruit in natural juice (without  added sugar). Meat and other protein foods Skinless chicken or turkey. Ground chicken or turkey. Pork with fat trimmed off. Fish and seafood. Egg whites. Dried beans, peas, or lentils. Unsalted nuts, nut butters, and seeds. Unsalted canned beans. Lean cuts of beef with fat trimmed off. Low-sodium, lean deli meat. Dairy Low-fat (1%) or fat-free (skim) milk. Fat-free, low-fat, or reduced-fat cheeses. Nonfat, low-sodium ricotta or cottage cheese. Low-fat or nonfat yogurt. Low-fat, low-sodium cheese. Fats and oils Soft margarine without trans fats. Vegetable oil. Low-fat, reduced-fat, or light mayonnaise and salad dressings (reduced-sodium). Canola, safflower, olive, soybean, and sunflower oils. Avocado. Seasoning and other foods Herbs. Spices. Seasoning mixes without salt. Unsalted popcorn and pretzels. Fat-free sweets. What foods are not recommended? The items listed may not be a complete list. Talk with your dietitian about what dietary choices are best for you. Grains Baked goods made with fat, such as croissants, muffins, or some breads. Dry pasta or rice meal packs. Vegetables Creamed or fried vegetables. Vegetables in a cheese sauce. Regular canned vegetables (not low-sodium or reduced-sodium). Regular canned tomato sauce and paste (not low-sodium or reduced-sodium). Regular tomato and vegetable juice (not low-sodium or reduced-sodium). Pickles. Olives. Fruits Canned fruit in a light or heavy syrup. Fried fruit. Fruit in cream or butter sauce. Meat and other protein foods Fatty cuts of meat. Ribs. Fried meat. Bacon. Sausage. Bologna and other processed lunch meats. Salami. Fatback. Hotdogs. Bratwurst. Salted nuts and seeds. Canned beans with added salt. Canned or smoked fish. Whole eggs or egg yolks. Chicken or turkey with skin. Dairy Whole or 2% milk, cream, and half-and-half. Whole or full-fat cream cheese. Whole-fat or sweetened yogurt. Full-fat cheese. Nondairy creamers. Whipped toppings.  Processed cheese and cheese spreads. Fats and oils Butter. Stick margarine. Lard. Shortening. Ghee. Bacon fat. Tropical oils, such as coconut, palm kernel, or palm oil. Seasoning and other foods Salted popcorn and pretzels. Onion salt, garlic salt, seasoned salt, table salt, and sea salt. Worcestershire sauce. Tartar sauce. Barbecue sauce. Teriyaki sauce. Soy sauce, including reduced-sodium. Steak sauce. Canned and packaged gravies. Fish sauce. Oyster sauce. Cocktail sauce. Horseradish that you find on the shelf. Ketchup. Mustard. Meat flavorings and tenderizers. Bouillon cubes. Hot sauce and Tabasco sauce. Premade or packaged marinades. Premade or packaged taco seasonings. Relishes. Regular salad dressings. Where to find more information:  National Heart, Lung, and Blood Institute: www.nhlbi.nih.gov  American Heart Association: www.heart.org Summary  The DASH eating plan is a healthy eating plan that has been shown to reduce high blood pressure (hypertension). It may also reduce your risk for type 2 diabetes, heart disease, and stroke.  With the DASH eating plan, you should limit salt (sodium) intake to 2,300 mg a day. If you have hypertension, you may need to reduce your sodium intake to 1,500 mg a day.  When on the DASH eating plan, aim to eat more fresh fruits and vegetables, whole grains, lean proteins, low-fat dairy, and heart-healthy fats.  Work with your health care provider or diet and nutrition specialist (dietitian) to adjust your eating plan to your   individual calorie needs. This information is not intended to replace advice given to you by your health care provider. Make sure you discuss any questions you have with your health care provider. Document Revised: 09/11/2017 Document Reviewed: 09/22/2016 Elsevier Patient Education  2020 Elsevier Inc.  

## 2019-11-09 NOTE — Progress Notes (Signed)
Patient [facility]   Established Patient Office Visit  Subjective:  Patient ID: [name] [name], male    DOB: 01/02/1946  Age: 74 y.o. MRN: [id number]  CC:  Chief Complaint  Patient presents with  . Follow-up    DM    HPI [name] [name] is a 74 year old male who presents for Follow Up today.   Past Medical History:  Diagnosis Date  . 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 Closed comminuted intertrochanteric fracture of left femur ([place])   . Diabetes mellitus without complication ([place])   . Diabetic hyperosmolar non-ketotic state ([place]) 05/15/2016  . DKA (diabetic ketoacidoses) ([place]) 05/09/2016  . Hypertension   . Postoperative anemia due to acute blood loss 06/18/2016  . Retroperitoneal hematoma 06/18/2016  . Stroke ([place])   . Vitamin B 12 deficiency 06/18/2016   Current Status: Since his last office visit, he is doing well with no complaints. His most recent normal range of preprandial blood glucose levels have been between . He has seen low range of 70 and high of 500 since his last office visit. He denies fatigue, frequent urination, blurred vision, excessive hunger, excessive thirst, weight gain, weight loss, and poor wound healing. He continues to check his feet regularly. His sister is accompanying him today. Her and his other sisters are attempting to help him with his healthcare. Sister reports that he does not follow diabetic diet and includes increased sweets in his diet, and sometimes misses meals. While examining patient today, several insects (bedbugs) were found on exam table and patient clothing. Patient admits to several past bedbug infestations and also that his couch was current had these same bugs in couch cushions. His sister was not aware of this and reported that family had recently removed old couch for the same reason, and replaced it with a new couch. He denies visual changes, chest  pain, cough, shortness of breath, heart palpitations, and falls. He has occasional headaches and dizziness with position changes. Denies severe headaches, confusion, seizures, double vision, and blurred vision, nausea and vomiting. He denies fevers, chills, recent infections, weight loss, and night sweats. No reports of GI problems such as diarrhea, and constipation. He has no reports of blood in stools, dysuria and hematuria. No depression or anxiety reported. He denies suicidal ideations, homicidal ideations, or auditory hallucinations. He denies pain today.   Past Surgical History:  Procedure Laterality Date  . HIP ARTHROPLASTY Right 02/05/2013   Procedure: ARTHROPLASTY BIPOLAR HIP;  Surgeon: [name], MD;  Location: [facility];  Service: Orthopedics;  Laterality: Right;  . INTRAMEDULLARY (IM) NAIL INTERTROCHANTERIC Left 06/16/2016   Procedure: INTRAMEDULLARY (IM) NAIL INTERTROCHANTRIC;  Surgeon: Rod Can, MD;  Location: [facility];  Service: Orthopedics;  Laterality: Left;    Family History  Problem Relation Age of Onset  . Diabetes Mother   . Alzheimer's disease Mother   . Hypertension Mother   . Hyperlipidemia Father   . Hypertension Father   . Healthy Maternal Grandmother   . Pneumonia Maternal Grandfather     Social History   Socioeconomic History  . Marital status: Single    Spouse name: Not on file  . Number of children: 2  . Years of education: 51  . Highest education level: Not on file  Occupational History  . Occupation: Retired  Tobacco Use  . Smoking status: Former Smoker    Packs/day: 0.25    Years:  30.00    Pack years: 7.50    Types: Cigarettes    Quit date: 02/01/2016    Years since quitting: 3.7  . Smokeless tobacco: Never Used  Substance and Sexual Activity  . Alcohol use: No  . Drug use: No  . Sexual activity: Not Currently  Other Topics Concern  . Not on file  Social History Narrative   Fun: Likes to do handyman related projects, photography.     Denies religious beliefs effecting health care.    Social Determinants of Health   Financial Resource Strain:   . Difficulty of Paying Living Expenses: Not on file  Food Insecurity:   . Worried About Charity fundraiser in the Last Year: Not on file  . Ran Out of Food in the Last Year: Not on file  Transportation Needs:   . Lack of Transportation (Medical): Not on file  . Lack of Transportation (Non-Medical): Not on file  Physical Activity:   . Days of Exercise per Week: Not on file  . Minutes of Exercise per Session: Not on file  Stress:   . Feeling of Stress : Not on file  Social Connections:   . Frequency of Communication with Friends and Family: Not on file  . Frequency of Social Gatherings with Friends and Family: Not on file  . Attends Religious Services: Not on file  . Active Member of Clubs or Organizations: Not on file  . Attends Archivist Meetings: Not on file  . Marital Status: Not on file  Intimate Partner Violence:   . Fear of Current or Ex-Partner: Not on file  . Emotionally Abused: Not on file  . Physically Abused: Not on file  . Sexually Abused: Not on file    Outpatient Medications Prior to Visit  Medication Sig Dispense Refill  . amLODipine (NORVASC) 10 MG tablet TAKE 1 TABLET ONCE DAILY. 30 tablet 0  . atorvastatin (LIPITOR) 10 MG tablet Take 1 tablet (10 mg total) by mouth daily. 30 tablet 3  . Blood Glucose Monitoring Suppl (ACCU-CHEK GUIDE) w/Device KIT 1 Device by Does not apply route QID. 1 kit 0  . glucose blood (ACCU-CHEK GUIDE) test strip Use as instructed 100 each 12  . glucose blood (TRUE METRIX BLOOD GLUCOSE TEST) test strip CHECK BLOOD SUGAR UP TO 4 TIMES A DAY. 300 each 0  . Insulin Glargine (LANTUS SOLOSTAR) 100 UNIT/ML Solostar Pen Inject 50 Units into the skin daily. 5 pen PRN  . insulin lispro (HUMALOG) 100 UNIT/ML injection Inject 30 units of insulin before meals and at bedtime for glucose levels >125 10 mL 3  . Insulin Pen  Needle (B-D ULTRAFINE III SHORT PEN) 31G X 8 MM MISC USE WITH INJECTIONS 100 each PRN  . Insulin Syringe-Needle U-100 (BD INSULIN SYRINGE U/F) 31G X 5/16" 0.3 ML MISC Inject 1 each as directed 2 (two) times daily. as directed 10 each 0  . FLUoxetine (PROZAC) 40 MG capsule Take 1 capsule (40 mg total) by mouth daily. 30 capsule 0  . Vitamin D, Ergocalciferol, (DRISDOL) 1.25 MG (50000 UT) CAPS capsule TAKE 1 CAPSULE WEEKLY. 5 capsule 0  . glipiZIDE (GLUCOTROL) 10 MG tablet Take 1 tablet (10 mg total) by mouth 2 (two) times daily before a meal. (Patient not taking: Reported on 05/17/2019) 60 tablet 3  . metFORMIN (GLUCOPHAGE) 500 MG tablet Take 1 tablet (500 mg total) by mouth 2 (two) times daily with a meal. (Patient not taking: Reported on 05/17/2019) 180 tablet 3  .  NOVOLOG MIX 70/30 (70-30) 100 UNIT/ML injection Inject 15 Units into the skin at bedtime.     No facility-administered medications prior to visit.    No Known Allergies  ROS Review of Systems  Constitutional: Negative.   HENT: Negative.   Eyes: Negative.   Respiratory: Negative.   Cardiovascular: Negative.   Gastrointestinal: Negative.   Endocrine: Negative.   Genitourinary: Negative.   Musculoskeletal: Positive for arthralgias (generalized. ).  Skin: Negative.   Allergic/Immunologic: Negative.   Neurological: Positive for dizziness (occasional ) and headaches (occasional).  Hematological: Negative.   Psychiatric/Behavioral: Negative.       Objective:    Physical Exam  Constitutional: He is oriented to person, place, and time. He appears well-developed and well-nourished.  HENT:  Head: Normocephalic and atraumatic.  Eyes: Conjunctivae are normal.  Cardiovascular: Normal rate, regular rhythm, normal heart sounds and intact distal pulses.  Pulmonary/Chest: Effort normal and breath sounds normal.  Abdominal: Soft. Bowel sounds are normal.  Musculoskeletal:        General: Normal range of motion.     Cervical back:  Normal range of motion and neck supple.  Neurological: He is alert and oriented to person, place, and time. He has normal reflexes.  Skin: Skin is warm and dry.  Bed bugs  Psychiatric: He has a normal mood and affect. His behavior is normal. Judgment and thought content normal.  Nursing note and vitals reviewed.   BP (!) 142/87 Comment: BP med taken at 8AM  Pulse 77   Temp 98.1 F (36.7 C) (Oral)   Ht '5\' 10"'  (1.778 m)   Wt 143 lb (64.9 kg)   SpO2 99%   BMI 20.52 kg/m  Wt Readings from Last 3 Encounters:  11/09/19 143 lb (64.9 kg)  05/17/19 143 lb 15.4 oz (65.3 kg)  03/16/19 141 lb (64 kg)     Health Maintenance Due  Topic Date Due  . URINE MICROALBUMIN  02/13/1955  . COLONOSCOPY  02/13/1995  . OPHTHALMOLOGY EXAM  10/14/2016  . FOOT EXAM  05/12/2018  . INFLUENZA VACCINE  05/14/2019    There are no preventive care reminders to display for this patient.  Lab Results  Component Value Date   TSH 2.247 05/17/2019   Lab Results  Component Value Date   WBC 6.4 05/17/2019   HGB 11.4 (L) 05/17/2019   HCT 34.3 (L) 05/17/2019   MCV 88.9 05/17/2019   PLT 219 05/17/2019   Lab Results  Component Value Date   NA 139 05/17/2019   K 4.5 05/17/2019   CO2 15 (L) 05/17/2019   GLUCOSE 101 (H) 05/17/2019   BUN 24 (H) 05/17/2019   CREATININE 1.72 (H) 05/17/2019   BILITOT 0.5 05/17/2019   ALKPHOS 59 05/17/2019   AST 17 05/17/2019   ALT 10 05/17/2019   PROT 6.1 (L) 05/17/2019   ALBUMIN 3.1 (L) 05/17/2019   CALCIUM 8.5 (L) 05/17/2019   ANIONGAP 11 05/17/2019   GFR 78.20 05/26/2016   Lab Results  Component Value Date   CHOL 220 (H) 03/16/2019   Lab Results  Component Value Date   HDL 61 03/16/2019   Lab Results  Component Value Date   LDLCALC 117 (H) 03/16/2019   Lab Results  Component Value Date   TRIG 212 (H) 03/16/2019   Lab Results  Component Value Date   CHOLHDL 3.6 03/16/2019   Lab Results  Component Value Date   HGBA1C 11.5 (A) 11/09/2019        Assessment & Plan:  1. Infestation by bed bug Family will obtain home treatment for infestation.   2. Type 2 diabetes mellitus without complication, with long-term current use of insulin ([place]) He will continue medication as prescribed, to decrease foods/beverages high in sugars and carbs and follow Heart Healthy or DASH diet. Increase physical activity to at least 30 minutes cardio exercise daily.  - POCT urinalysis dipstick - POCT glycosylated hemoglobin (Hb A1C) - POCT glucose (manual entry)  3. Uncontrolled type 2 diabetes mellitus with hyperglycemia ([place])  4. Hemoglobin A1C greater than 9%, indicating poor diabetic control Improved. Hgb A1c decreased at 11.5, from 15.3 on 03/16/2019. Monitor.   5. Hyperglycemia  6. Essential hypertension The current medical regimen is effective; blood pressure is stable at 142/87 today; continue present plan and medications as prescribed. He will continue to take medications as prescribed, to decrease high sodium intake, excessive alcohol intake, increase potassium intake, smoking cessation, and increase physical activity of at least 30 minutes of cardio activity daily. He will continue to follow Heart Healthy or DASH diet.  7. Vitamin D deficiency - Vitamin D, 25-hydroxy - Vitamin D, Ergocalciferol, (DRISDOL) 1.25 MG (50000 UNIT) CAPS capsule; Take 1 capsule (50,000 Units total) by mouth once a week.  Dispense: 5 capsule; Refill: 6  8. Vitamin B12 deficiency - Vitamin B12  9. Anxiety - FLUoxetine (PROZAC) 40 MG capsule; Take 1 capsule (40 mg total) by mouth daily.  Dispense: 30 capsule; Refill: 6  10. Follow up He will follow up in 3 months.   Meds ordered this encounter  Medications  . Vitamin D, Ergocalciferol, (DRISDOL) 1.25 MG (50000 UNIT) CAPS capsule    Sig: Take 1 capsule (50,000 Units total) by mouth once a week.    Dispense:  5 capsule    Refill:  6  . FLUoxetine (PROZAC) 40 MG capsule    Sig: Take 1 capsule (40 mg total) by  mouth daily.    Dispense:  30 capsule    Refill:  6    Orders Placed This Encounter  Procedures  . Vitamin D, 25-hydroxy  . Vitamin B12  . POCT urinalysis dipstick  . POCT glycosylated hemoglobin (Hb A1C)  . POCT glucose (manual entry)    Referral Orders  No referral(s) requested today    Kathe Becton,  MSN, [name]-BC [place],  [phone] fax   Problem List Items Addressed This Visit      Cardiovascular and Mediastinum   HTN (hypertension) (Chronic)     Other   Anxiety   Relevant Medications   FLUoxetine (PROZAC) 40 MG capsule   Hyperglycemia    Other Visit Diagnoses    Infestation by bed bug    -  Primary   Type 2 diabetes mellitus without complication, with long-term current use of insulin ([name])       Relevant Orders   POCT urinalysis dipstick (Completed)   POCT glycosylated hemoglobin (Hb A1C) (Completed)   POCT glucose (manual entry) (Completed)   Uncontrolled type 2 diabetes mellitus with hyperglycemia ([name])       Hemoglobin A1C greater than 9%, indicating poor diabetic control       Vitamin D deficiency       Relevant Medications   Vitamin D, Ergocalciferol, (DRISDOL) 1.25 MG (50000 UNIT) CAPS capsule   Other Relevant Orders   Vitamin D, 25-hydroxy (Completed)   Vitamin B12 deficiency  Relevant Orders   Vitamin B12 (Completed)   Follow up          Meds ordered this encounter  Medications  . Vitamin D, Ergocalciferol, (DRISDOL) 1.25 MG (50000 UNIT) CAPS capsule    Sig: Take 1 capsule (50,000 Units total) by mouth once a week.    Dispense:  5 capsule    Refill:  6  . FLUoxetine (PROZAC) 40 MG capsule    Sig: Take 1 capsule (40 mg total) by mouth daily.    Dispense:  30 capsule    Refill:  6    Follow-up: Return in about 3 months (around 02/07/2020).    Azzie Glatter, [name]

## 2019-11-10 LAB — VITAMIN D 25 HYDROXY (VIT D DEFICIENCY, FRACTURES): Vit D, 25-Hydroxy: 25.7 ng/mL — ABNORMAL LOW (ref 30.0–100.0)

## 2019-11-10 LAB — VITAMIN B12: Vitamin B-12: 490 pg/mL (ref 232–1245)

## 2019-11-20 ENCOUNTER — Other Ambulatory Visit: Payer: Self-pay | Admitting: Family Medicine

## 2019-11-21 ENCOUNTER — Other Ambulatory Visit: Payer: Self-pay | Admitting: Family Medicine

## 2019-11-22 ENCOUNTER — Emergency Department (HOSPITAL_COMMUNITY): Payer: Medicare Other

## 2019-11-22 ENCOUNTER — Other Ambulatory Visit: Payer: Self-pay

## 2019-11-22 ENCOUNTER — Inpatient Hospital Stay (HOSPITAL_COMMUNITY)
Admission: EM | Admit: 2019-11-22 | Discharge: 2019-11-25 | DRG: 637 | Disposition: A | Discharge status: Home Health | Payer: Medicare Other | Attending: Internal Medicine | Admitting: Internal Medicine

## 2019-11-22 DIAGNOSIS — Z87891 Personal history of nicotine dependence: Secondary | ICD-10-CM | POA: Diagnosis not present

## 2019-11-22 DIAGNOSIS — D649 Anemia, unspecified: Secondary | ICD-10-CM | POA: Diagnosis present

## 2019-11-22 DIAGNOSIS — F0280 Dementia in other diseases classified elsewhere without behavioral disturbance: Secondary | ICD-10-CM | POA: Diagnosis present

## 2019-11-22 DIAGNOSIS — E538 Deficiency of other specified B group vitamins: Secondary | ICD-10-CM | POA: Diagnosis present

## 2019-11-22 DIAGNOSIS — Z794 Long term (current) use of insulin: Secondary | ICD-10-CM | POA: Diagnosis not present

## 2019-11-22 DIAGNOSIS — I129 Hypertensive chronic kidney disease with stage 1 through stage 4 chronic kidney disease, or unspecified chronic kidney disease: Secondary | ICD-10-CM | POA: Diagnosis present

## 2019-11-22 DIAGNOSIS — N1832 Chronic kidney disease, stage 3b: Secondary | ICD-10-CM | POA: Diagnosis present

## 2019-11-22 DIAGNOSIS — N179 Acute kidney failure, unspecified: Secondary | ICD-10-CM | POA: Diagnosis present

## 2019-11-22 DIAGNOSIS — Z20822 Contact with and (suspected) exposure to covid-19: Secondary | ICD-10-CM | POA: Diagnosis present

## 2019-11-22 DIAGNOSIS — E160 Drug-induced hypoglycemia without coma: Secondary | ICD-10-CM | POA: Diagnosis not present

## 2019-11-22 DIAGNOSIS — E876 Hypokalemia: Secondary | ICD-10-CM | POA: Diagnosis present

## 2019-11-22 DIAGNOSIS — H9190 Unspecified hearing loss, unspecified ear: Secondary | ICD-10-CM | POA: Diagnosis present

## 2019-11-22 DIAGNOSIS — I5032 Chronic diastolic (congestive) heart failure: Secondary | ICD-10-CM | POA: Diagnosis present

## 2019-11-22 DIAGNOSIS — F0150 Vascular dementia without behavioral disturbance: Secondary | ICD-10-CM | POA: Diagnosis present

## 2019-11-22 DIAGNOSIS — E162 Hypoglycemia, unspecified: Secondary | ICD-10-CM

## 2019-11-22 DIAGNOSIS — G309 Alzheimer's disease, unspecified: Secondary | ICD-10-CM | POA: Diagnosis present

## 2019-11-22 DIAGNOSIS — R4189 Other symptoms and signs involving cognitive functions and awareness: Secondary | ICD-10-CM

## 2019-11-22 DIAGNOSIS — I69319 Unspecified symptoms and signs involving cognitive functions following cerebral infarction: Secondary | ICD-10-CM

## 2019-11-22 DIAGNOSIS — E860 Dehydration: Secondary | ICD-10-CM | POA: Diagnosis present

## 2019-11-22 DIAGNOSIS — Y92009 Unspecified place in unspecified non-institutional (private) residence as the place of occurrence of the external cause: Secondary | ICD-10-CM | POA: Diagnosis not present

## 2019-11-22 DIAGNOSIS — Z833 Family history of diabetes mellitus: Secondary | ICD-10-CM

## 2019-11-22 DIAGNOSIS — F329 Major depressive disorder, single episode, unspecified: Secondary | ICD-10-CM | POA: Diagnosis present

## 2019-11-22 DIAGNOSIS — Z79899 Other long term (current) drug therapy: Secondary | ICD-10-CM

## 2019-11-22 DIAGNOSIS — I639 Cerebral infarction, unspecified: Secondary | ICD-10-CM

## 2019-11-22 DIAGNOSIS — E1122 Type 2 diabetes mellitus with diabetic chronic kidney disease: Secondary | ICD-10-CM | POA: Diagnosis present

## 2019-11-22 DIAGNOSIS — Z23 Encounter for immunization: Secondary | ICD-10-CM | POA: Diagnosis present

## 2019-11-22 DIAGNOSIS — R739 Hyperglycemia, unspecified: Secondary | ICD-10-CM

## 2019-11-22 DIAGNOSIS — E1165 Type 2 diabetes mellitus with hyperglycemia: Secondary | ICD-10-CM | POA: Diagnosis not present

## 2019-11-22 DIAGNOSIS — G9341 Metabolic encephalopathy: Secondary | ICD-10-CM | POA: Diagnosis present

## 2019-11-22 DIAGNOSIS — E11649 Type 2 diabetes mellitus with hypoglycemia without coma: Secondary | ICD-10-CM | POA: Diagnosis not present

## 2019-11-22 DIAGNOSIS — Z8249 Family history of ischemic heart disease and other diseases of the circulatory system: Secondary | ICD-10-CM

## 2019-11-22 DIAGNOSIS — Z820 Family history of epilepsy and other diseases of the nervous system: Secondary | ICD-10-CM

## 2019-11-22 DIAGNOSIS — T383X5A Adverse effect of insulin and oral hypoglycemic [antidiabetic] drugs, initial encounter: Secondary | ICD-10-CM | POA: Diagnosis present

## 2019-11-22 LAB — CBC
HCT: 35.7 % — ABNORMAL LOW (ref 39.0–52.0)
HCT: 34.4 % — ABNORMAL LOW (ref 39.0–52.0)
Hemoglobin: 11.6 g/dL — ABNORMAL LOW (ref 13.0–17.0)
Hemoglobin: 11.1 g/dL — ABNORMAL LOW (ref 13.0–17.0)
MCH: 29.2 pg (ref 26.0–34.0)
MCH: 29.4 pg (ref 26.0–34.0)
MCHC: 32.5 g/dL (ref 30.0–36.0)
MCHC: 32.3 g/dL (ref 30.0–36.0)
MCV: 89.9 fL (ref 80.0–100.0)
MCV: 91 fL (ref 80.0–100.0)
Platelets: 168 10*3/uL (ref 150–400)
Platelets: 235 10*3/uL (ref 150–400)
RBC: 3.97 MIL/uL — ABNORMAL LOW (ref 4.22–5.81)
RBC: 3.78 MIL/uL — ABNORMAL LOW (ref 4.22–5.81)
RDW: 16.3 % — ABNORMAL HIGH (ref 11.5–15.5)
RDW: 14.9 % (ref 11.5–15.5)
WBC: 12.6 10*3/uL — ABNORMAL HIGH (ref 4.0–10.5)
WBC: 10.5 10*3/uL (ref 4.0–10.5)
nRBC: 0 % (ref 0.0–0.2)
nRBC: 0 % (ref 0.0–0.2)

## 2019-11-22 LAB — COMPREHENSIVE METABOLIC PANEL
ALT: 17 U/L (ref 0–44)
AST: 31 U/L (ref 15–41)
Albumin: 2.7 g/dL — ABNORMAL LOW (ref 3.5–5.0)
Alkaline Phosphatase: 53 U/L (ref 38–126)
Anion gap: 9 (ref 5–15)
BUN: 27 mg/dL — ABNORMAL HIGH (ref 8–23)
CO2: 23 mmol/L (ref 22–32)
Calcium: 8.3 mg/dL — ABNORMAL LOW (ref 8.9–10.3)
Chloride: 111 mmol/L (ref 98–111)
Creatinine, Ser: 2.24 mg/dL — ABNORMAL HIGH (ref 0.61–1.24)
GFR calc Af Amer: 32 mL/min — ABNORMAL LOW (ref >60)
GFR calc non Af Amer: 28 mL/min — ABNORMAL LOW (ref >60)
Glucose, Bld: 36 mg/dL — CL (ref 70–99)
Potassium: 2.7 mmol/L — CL (ref 3.5–5.1)
Sodium: 143 mmol/L (ref 135–145)
Total Bilirubin: 0.6 mg/dL (ref 0.3–1.2)
Total Protein: 6.2 g/dL — ABNORMAL LOW (ref 6.5–8.1)

## 2019-11-22 LAB — CBG MONITORING, ED
Glucose-Capillary: 156 mg/dL — ABNORMAL HIGH (ref 70–99)
Glucose-Capillary: 146 mg/dL — ABNORMAL HIGH (ref 70–99)
Glucose-Capillary: 16 mg/dL — CL (ref 70–99)
Glucose-Capillary: 25 mg/dL — CL (ref 70–99)
Glucose-Capillary: 32 mg/dL — CL (ref 70–99)
Glucose-Capillary: 115 mg/dL — ABNORMAL HIGH (ref 70–99)
Glucose-Capillary: 84 mg/dL (ref 70–99)

## 2019-11-22 LAB — GLUCOSE, CAPILLARY
Glucose-Capillary: 133 mg/dL — ABNORMAL HIGH (ref 70–99)
Glucose-Capillary: 211 mg/dL — ABNORMAL HIGH (ref 70–99)
Glucose-Capillary: 54 mg/dL — ABNORMAL LOW (ref 70–99)

## 2019-11-22 LAB — URINALYSIS, ROUTINE W REFLEX MICROSCOPIC
Bacteria, UA: NONE SEEN
Bilirubin Urine: NEGATIVE
Glucose, UA: 150 mg/dL — AB
Ketones, ur: NEGATIVE mg/dL
Leukocytes,Ua: NEGATIVE
Nitrite: NEGATIVE
Protein, ur: 100 mg/dL — AB
Specific Gravity, Urine: 1.011 (ref 1.005–1.030)
pH: 6 (ref 5.0–8.0)

## 2019-11-22 LAB — MAGNESIUM: Magnesium: 2.2 mg/dL (ref 1.7–2.4)

## 2019-11-22 LAB — SARS CORONAVIRUS 2 (TAT 6-24 HRS): SARS Coronavirus 2: NEGATIVE

## 2019-11-22 MED ORDER — DEXTROSE 50 % IV SOLN: 25.0000 g | Freq: Once | INTRAVENOUS | Status: DC

## 2019-11-22 MED ORDER — DEXTROSE 50 % IV SOLN
INTRAVENOUS | Status: AC
  Filled 2019-11-22: qty 50

## 2019-11-22 MED ORDER — DEXTROSE-NACL 5-0.9 % IV SOLN: INTRAVENOUS | Status: DC

## 2019-11-22 MED ORDER — POTASSIUM CHLORIDE 10 MEQ/100ML IV SOLN
10.0000 meq | INTRAVENOUS | Status: DC
  Administered 2019-11-22: 10 meq via INTRAVENOUS
  Filled 2019-11-22: qty 100

## 2019-11-22 MED ORDER — DEXTROSE 10 % IV SOLN: 5.0000 mL/kg | INTRAVENOUS | Status: DC

## 2019-11-22 MED ORDER — AMLODIPINE BESYLATE 10 MG PO TABS
10.0000 mg | ORAL_TABLET | Freq: Every day | ORAL | Status: DC
  Administered 2019-11-22 – 2019-11-25 (×4): 10 mg via ORAL
  Filled 2019-11-22: qty 2
  Filled 2019-11-22 (×3): qty 1

## 2019-11-22 MED ORDER — ATORVASTATIN CALCIUM 10 MG PO TABS
10.0000 mg | ORAL_TABLET | Freq: Every day | ORAL | Status: DC
  Administered 2019-11-22 – 2019-11-25 (×4): 10 mg via ORAL
  Filled 2019-11-22 (×4): qty 1

## 2019-11-22 MED ORDER — VITAMIN D (ERGOCALCIFEROL) 1.25 MG (50000 UNIT) PO CAPS: 50000.0000 [IU] | ORAL_CAPSULE | ORAL | Status: DC

## 2019-11-22 MED ORDER — SODIUM CHLORIDE 0.9% FLUSH
3.0000 mL | Freq: Once | INTRAVENOUS | Status: AC
  Administered 2019-11-22: 3 mL via INTRAVENOUS

## 2019-11-22 MED ORDER — ASPIRIN 81 MG PO CHEW
81.0000 mg | CHEWABLE_TABLET | Freq: Every day | ORAL | Status: DC
  Administered 2019-11-22 – 2019-11-25 (×4): 81 mg via ORAL
  Filled 2019-11-22 (×4): qty 1

## 2019-11-22 MED ORDER — ACETAMINOPHEN 650 MG RE SUPP: 650.0000 mg | Freq: Four times a day (QID) | RECTAL | Status: DC | PRN

## 2019-11-22 MED ORDER — DEXTROSE 50 % IV SOLN
INTRAVENOUS | Status: AC
  Administered 2019-11-22: 25 mL
  Filled 2019-11-22: qty 50

## 2019-11-22 MED ORDER — HEPARIN SODIUM (PORCINE) 5000 UNIT/ML IJ SOLN
5000.0000 [IU] | Freq: Three times a day (TID) | INTRAMUSCULAR | Status: DC
  Administered 2019-11-22 – 2019-11-25 (×9): 5000 [IU] via SUBCUTANEOUS
  Filled 2019-11-22 (×9): qty 1

## 2019-11-22 MED ORDER — POTASSIUM CHLORIDE 20 MEQ PO PACK
40.0000 meq | PACK | ORAL | Status: AC
  Administered 2019-11-22 – 2019-11-23 (×2): 40 meq via ORAL
  Filled 2019-11-22 (×4): qty 2

## 2019-11-22 MED ORDER — FLUOXETINE HCL 20 MG PO CAPS
40.0000 mg | ORAL_CAPSULE | Freq: Every day | ORAL | Status: DC
  Administered 2019-11-22 – 2019-11-25 (×4): 40 mg via ORAL
  Filled 2019-11-22 (×4): qty 2

## 2019-11-22 MED ORDER — KCL IN DEXTROSE-NACL 10-5-0.45 MEQ/L-%-% IV SOLN
INTRAVENOUS | Status: DC
  Filled 2019-11-22 (×2): qty 1000

## 2019-11-22 MED ORDER — AMLODIPINE BESYLATE 5 MG PO TABS
10.0000 mg | ORAL_TABLET | Freq: Once | ORAL | Status: AC
  Administered 2019-11-22: 10 mg via ORAL
  Filled 2019-11-22: qty 2

## 2019-11-22 MED ORDER — ONDANSETRON HCL 4 MG PO TABS: 4.0000 mg | ORAL_TABLET | Freq: Four times a day (QID) | ORAL | Status: DC | PRN

## 2019-11-22 MED ORDER — HYDRALAZINE HCL 20 MG/ML IJ SOLN
10.0000 mg | Freq: Four times a day (QID) | INTRAMUSCULAR | Status: DC | PRN
  Administered 2019-11-25: 10 mg via INTRAVENOUS
  Filled 2019-11-22 (×2): qty 1

## 2019-11-22 MED ORDER — ONDANSETRON HCL 4 MG/2ML IJ SOLN: 4.0000 mg | Freq: Four times a day (QID) | INTRAMUSCULAR | Status: DC | PRN

## 2019-11-22 MED ORDER — FAMOTIDINE 20 MG PO TABS
20.0000 mg | ORAL_TABLET | Freq: Every day | ORAL | Status: DC
  Administered 2019-11-22 – 2019-11-25 (×4): 20 mg via ORAL
  Filled 2019-11-22 (×4): qty 1

## 2019-11-22 MED ORDER — DEXTROSE 5 % IV SOLN: Freq: Once | INTRAVENOUS | Status: DC

## 2019-11-22 MED ORDER — ACETAMINOPHEN 325 MG PO TABS: 650.0000 mg | ORAL_TABLET | Freq: Four times a day (QID) | ORAL | Status: DC | PRN

## 2019-11-22 NOTE — ED Notes (Signed)
Pt denied this RN's offer to call his family to give them an update.

## 2019-11-22 NOTE — H&P (Signed)
History and Physical    DOA: 11/22/2019  PCP: Azzie Glatter, [name]  Patient coming from: Home  Chief Complaint: Altered mental status  HPI: [name] [name] is a 74 y.o. male with history h/o hypertension, diabetes mellitus-insulin dependent, CVA in December 19, 2014, CKD stage IIIb with baseline creatinine around 1.6-1.8, chronic anemia, cognitive impairment since stroke who lives alone brought in by family and concern for altered mental status.  Brought in by familybrought in by family in concern for AMS. He apparently had slurred speech, appeared less responsive. EMS was summoned and he was found to have BG of 33. Patient received 1 amp of D50 via EMS, BG improved to 150 on arrival here but dropped again to <60 prompting another dose D50 ampule and initiation of IV dextrose fluids.Home med list shows Lantus, 70/30 insulin, sliding scale with aspart insulin, Glipizide and Metformin. Patient apparently has memory issues since last stroke and does not remember if he took medications.  His daughter used to be his primary caregiver but she passed away in 12-19-17 and since then his sisters Dorris Fetch and [name]) have been trying to help him out by taking turns, cooking/cleaning for him and administering meds.  According to sister, patient's oral intake has been erratic lately.  She draws up insulin for patient to administer himself based on the blood glucose levels before breakfast and before dinner.  They apparently administer 70/30 insulin at night 5 units as he tends to eat small amounts and dinner.  They appear to be following a sliding scale pattern for administering Lantus (if blood glucose before breakfast is greater than 500, patient gets 15 units-if blood glucose 200-400, he gets 6 units and if less than 200 he gets no insulin.  He however gets oral hypoglycemic agents irrespective of blood glucose levels.  Has been hypoglycemic over the last week with BG in 14s every time sisters visited him.Hence they have held subcu  insulins but patient been getting oral hypoglycemics anyway.  Last time he got insulin was past Thursday. No history of vomiting, diarrhea, dysuria, fever, chills.  No history of seizures.  CT head in the ED showed mild cortical atrophy, chronic microvascular disease but no acute strokes.  Currently he is awake and communicating but does not recollect events today.  BP elevated with systolic 1 65-9 93T.  Review of Systems: As per HPI otherwise 10 point review of systems negative.    Past Medical History:  Diagnosis Date  . Cerebral infarction due to thrombosis of right posterior cerebral artery ([place]) 06/08/2015  . Closed comminuted intertrochanteric fracture of left femur ([place])   . Diabetes mellitus without complication ([place])   . Diabetic hyperosmolar non-ketotic state ([place]) 05/15/2016  . DKA (diabetic ketoacidoses) ([place]) 05/09/2016  . Hypertension   . Postoperative anemia due to acute blood loss 06/18/2016  . Retroperitoneal hematoma 06/18/2016  . Stroke ([place])   . Vitamin B 12 deficiency 06/18/2016    Past Surgical History:  Procedure Laterality Date  . HIP ARTHROPLASTY Right 02/05/2013   Procedure: ARTHROPLASTY BIPOLAR HIP;  Surgeon: [name], MD;  Location: [facility];  Service: Orthopedics;  Laterality: Right;  . INTRAMEDULLARY (IM) NAIL INTERTROCHANTERIC Left 06/16/2016   Procedure: INTRAMEDULLARY (IM) NAIL INTERTROCHANTRIC;  Surgeon: Rod Can, MD;  Location: [facility];  Service: Orthopedics;  Laterality: Left;    Social history:  reports that he quit smoking about 3 years ago. His smoking use included cigarettes. He has a 7.50 pack-year smoking history. He has never used smokeless  tobacco. He reports that he does not drink alcohol or use drugs.   No Known Allergies  Family History  Problem Relation Age of Onset  . Diabetes Mother   . Alzheimer's disease Mother   . Hypertension Mother   . Hyperlipidemia Father   . Hypertension Father   . Healthy Maternal Grandmother   .  Pneumonia Maternal Grandfather       Prior to Admission medications   Medication Sig Start Date End Date Taking? Authorizing Provider  amLODipine (NORVASC) 10 MG tablet TAKE 1 TABLET ONCE DAILY. 09/28/19  Yes Azzie Glatter, [name]  atorvastatin (LIPITOR) 10 MG tablet Take 1 tablet (10 mg total) by mouth daily. 11/02/19  Yes Azzie Glatter, [name]  FLUoxetine (PROZAC) 40 MG capsule Take 1 capsule (40 mg total) by mouth daily. 11/09/19  Yes Azzie Glatter, [name]  glipiZIDE (GLUCOTROL) 10 MG tablet Take 1 tablet (10 mg total) by mouth 2 (two) times daily before a meal. 03/16/19  Yes Azzie Glatter, [name]  NOVOLOG MIX 70/30 (70-30) 100 UNIT/ML injection Inject 0-10 Units into the skin at bedtime.  05/16/19  Yes [provider]  Vitamin D, Ergocalciferol, (DRISDOL) 1.25 MG (50000 UNIT) CAPS capsule Take 1 capsule (50,000 Units total) by mouth once a week. 11/09/19  Yes Azzie Glatter, [name]  ACCU-CHEK GUIDE test strip USE AS DIRECTED 11/21/19   Azzie Glatter, [name]  Blood Glucose Monitoring Suppl (ACCU-CHEK GUIDE) w/Device KIT 1 Device by Does not apply route QID. 11/02/19   Azzie Glatter, [name]  glucose blood (ACCU-CHEK GUIDE) test strip Use as instructed 11/03/18   Azzie Glatter, [name]  glucose blood (TRUE METRIX BLOOD GLUCOSE TEST) test strip CHECK BLOOD SUGAR UP TO 4 TIMES A DAY. 05/25/18   Azzie Glatter, [name]  Insulin Glargine (LANTUS SOLOSTAR) 100 UNIT/ML Solostar Pen Inject 50 Units into the skin daily. Patient taking differently: Inject 6-12 Units into the skin daily.  11/02/19   Azzie Glatter, [name]  insulin lispro (HUMALOG) 100 UNIT/ML injection Inject 30 units of insulin before meals and at bedtime for glucose levels >125 Patient taking differently: Inject 0-12 Units into the skin 3 (three) times daily with meals. Inject 30 units of insulin before meals and at bedtime for glucose levels >125 11/02/19 11/01/20  Azzie Glatter, [name]  Insulin Pen Needle (B-D ULTRAFINE III SHORT  PEN) 31G X 8 MM MISC USE WITH INJECTIONS 11/02/19   Azzie Glatter, [name]  Insulin Syringe-Needle U-100 (BD INSULIN SYRINGE U/F) 31G X 5/16" 0.3 ML MISC Inject 1 each as directed 2 (two) times daily. as directed 11/02/19   Azzie Glatter, [name]  metFORMIN (GLUCOPHAGE) 500 MG tablet Take 1 tablet (500 mg total) by mouth 2 (two) times daily with a meal. Patient not taking: Reported on 05/17/2019 03/16/19   Azzie Glatter, [name]    Physical Exam: Vitals:   11/22/19 1645 11/22/19 1715 11/22/19 1730 11/22/19 1745  BP: (!) 159/99 (!) 190/81 (!) 186/93 (!) 171/87  Pulse:      Resp: 20 (!) 21 (!) 22 (!) 21  Temp:      TempSrc:      SpO2:      Weight:      Height:        Constitutional: NAD, calm, comfortable. AAO x 2 now.  Eyes: PERRL, lids and conjunctivae normal ENMT: Mucous membranes are moist. Posterior pharynx clear of any exudate or lesions.Normal dentition.  Neck: normal, supple, no masses,  no thyromegaly Respiratory: clear to auscultation bilaterally, no wheezing, no crackles. Normal respiratory effort. No accessory muscle use.  Cardiovascular: Regular rate and rhythm, no murmurs / rubs / gallops. No extremity edema. 2+ pedal pulses. No carotid bruits.  Abdomen: no tenderness, no masses palpated. No hepatosplenomegaly. Bowel sounds positive.  Musculoskeletal: no clubbing / cyanosis. No joint deformity upper and lower extremities. Good ROM, no contractures. Normal muscle tone.  Neurologic: CN 2-12 grossly intact. Sensation intact, DTR normal. Strength 5/5 in all 4.  Psychiatric: Normal judgment and insight. Alert and oriented x 2- 3. Normal mood.  SKIN/catheters: no rashes, lesions, ulcers. No induration  Labs on Admission: I have personally reviewed following labs and imaging studies  CBC: Recent Labs  Lab 11/22/19 1250  WBC 12.6*  HGB 11.6*  HCT 35.7*  MCV 89.9  PLT 242   Basic Metabolic Panel: Recent Labs  Lab 11/22/19 1446 11/22/19 1600  NA SPECIMEN HEMOLYZED.  HEMOLYSIS MAY AFFECT INTEGRITY OF RESULTS. [place] HEMOLYSIS MAY AFFECT INTEGRITY OF RESULTS. 2.7*  CL SPECIMEN HEMOLYZED. HEMOLYSIS MAY AFFECT INTEGRITY OF RESULTS. [place] HEMOLYSIS MAY AFFECT INTEGRITY OF RESULTS. 23  GLUCOSE SPECIMEN HEMOLYZED. HEMOLYSIS MAY AFFECT INTEGRITY OF RESULTS. 36*  BUN SPECIMEN HEMOLYZED. HEMOLYSIS MAY AFFECT INTEGRITY OF RESULTS. 27*  CREATININE SPECIMEN HEMOLYZED. HEMOLYSIS MAY AFFECT INTEGRITY OF RESULTS. 2.24*  CALCIUM SPECIMEN HEMOLYZED. HEMOLYSIS MAY AFFECT INTEGRITY OF RESULTS. 8.3*   GFR: Estimated Creatinine Clearance: 26 mL/min (A) (by C-G formula based on SCr of 2.24 mg/dL (H)). Recent Labs  Lab 11/22/19 1250  WBC 12.6*   Liver Function Tests: Recent Labs  Lab 11/22/19 1446 11/22/19 1600  AST SPECIMEN HEMOLYZED. HEMOLYSIS MAY AFFECT INTEGRITY OF RESULTS. 31  ALT SPECIMEN HEMOLYZED. HEMOLYSIS MAY AFFECT INTEGRITY OF RESULTS. [place] HEMOLYSIS MAY AFFECT INTEGRITY OF RESULTS. 53  BILITOT SPECIMEN HEMOLYZED. HEMOLYSIS MAY AFFECT INTEGRITY OF RESULTS. 0.6  PROT SPECIMEN HEMOLYZED. HEMOLYSIS MAY AFFECT INTEGRITY OF RESULTS. 6.2*  ALBUMIN SPECIMEN HEMOLYZED. HEMOLYSIS MAY AFFECT INTEGRITY OF RESULTS. 2.7*   No results for input(s): LIPASE, AMYLASE in the last 168 hours. No results for input(s): AMMONIA in the last 168 hours. Coagulation Profile: No results for input(s): INR, PROTIME in the last 168 hours. Cardiac Enzymes: No results for input(s): CKTOTAL, CKMB, CKMBINDEX, TROPONINI in the last 168 hours. BNP (last 3 results) No results for input(s): PROBNP in the last 8760 hours. HbA1C: No results for input(s): HGBA1C in the last 72 hours. CBG: Recent Labs  Lab 11/22/19 1106 11/22/19 1557 11/22/19 1558 11/22/19 1610 11/22/19 1825  GLUCAP 156* 16* 25* 146* 32*   Lipid Profile: No results for input(s): CHOL, HDL, LDLCALC, TRIG, CHOLHDL, LDLDIRECT in the last 72  hours. Thyroid Function Tests: No results for input(s): TSH, T4TOTAL, FREET4, T3FREE, THYROIDAB in the last 72 hours. Anemia Panel: No results for input(s): VITAMINB12, FOLATE, FERRITIN, TIBC, IRON, RETICCTPCT in the last 72 hours. Urine analysis:    Component Value Date/Time   COLORURINE STRAW (A) 11/22/2019 1600   APPEARANCEUR CLEAR 11/22/2019 1600   LABSPEC 1.011 11/22/2019 1600   PHURINE 6.0 11/22/2019 1600   GLUCOSEU 150 (A) 11/22/2019 1600   HGBUR SMALL (A) 11/22/2019 1600   BILIRUBINUR NEGATIVE 11/22/2019 1600   BILIRUBINUR Negative 11/09/2019 0910   KETONESUR NEGATIVE 11/22/2019 1600   PROTEINUR 100 (A) 11/22/2019 1600   UROBILINOGEN 0.2 11/09/2019 0910   UROBILINOGEN 0.2 05/12/2017 0939   NITRITE NEGATIVE 11/22/2019 1600   LEUKOCYTESUR NEGATIVE 11/22/2019  1600    Radiological Exams on Admission: Personally reviewed  CT Head Wo Contrast  Result Date: 11/22/2019 CLINICAL DATA:  Altered mental status. EXAM: CT HEAD WITHOUT CONTRAST TECHNIQUE: Contiguous axial images were obtained from the base of the skull through the vertex without intravenous contrast. COMPARISON:  January 27, 2019. FINDINGS: Brain: Mild diffuse cortical atrophy is noted. Mild chronic ischemic white matter disease is noted. No mass effect or midline shift is noted. Ventricular size is within normal limits. There is no evidence of mass lesion, hemorrhage or acute infarction. Vascular: No hyperdense vessel or unexpected calcification. Skull: Normal. Negative for fracture or focal lesion. Sinuses/Orbits: No acute finding. Other: None. IMPRESSION: Mild diffuse cortical atrophy. Mild chronic ischemic white matter disease. No acute intracranial abnormality seen. Electronically Signed   By: Marijo Conception M.D.   On: 11/22/2019 16:34   DG Chest Portable 1 View  Result Date: 11/22/2019 CLINICAL DATA:  Altered level of consciousness, leukocytosis EXAM: PORTABLE CHEST 1 VIEW COMPARISON:  05/17/2019 FINDINGS: The heart size  and mediastinal contours are within normal limits. Both lungs are clear. The visualized skeletal structures are unremarkable. IMPRESSION: No active disease. Electronically Signed   By: Randa Ngo M.D.   On: 11/22/2019 15:13    EKG: not done in ED. Ordered     Assessment and Plan:   Principal Problem:   Acute metabolic encephalopathy Active Problems:   Vitamin B 12 deficiency   Hypoglycemia due to insulin   Normocytic anemia   Uncontrolled type 2 diabetes mellitus with hyperglycemia, with long-term current use of insulin ([name])   CVA (cerebral vascular accident) ([place])   Cognitive decline    1.  Acute metabolic encephalopathy due to hypoglycemia: Blood glucose levels dropped again in the ED and now initiated on dextrose fluids.  Monitor blood glucose levels every 2 hours for next 8 hours and then every 4 hours.  Admit with continuous dextrose fluids.  CT head with mild cortical atrophy/chronic microvascular disease.  Slurred speech and altered mental status likely due to hypoglycemia.  Will defer further stroke work-up.  He does take aspirin/statins at baseline-will resume.  2.  Diabetes mellitus: Hold all oral hypoglycemic agents and subcu insulin regimen as discussed above.  Patient should not be on Metformin given CKD.  3.  AKI on CKD stage IIIb: Secondary to poor oral intake and dehydration.  Repeat labs after IV hydration.  Hold Metformin.  Avoid nephrotoxic agents.  4. Hypokalemia: No report of diarrhea or vomiting.  Could be related to insulin overdosing.  Replace IV and orally.  5.  History of CVA: Resume aspirin and statins.  6.  Chronic normocytic anemia: Baseline hemoglobin around 10-12.  Patient has history of vitamin B12 deficiency, retroperitoneal hematoma/blood loss anemia.  Denies any active bleeding.  Hemoglobin stable.  Continue to monitor.  7.  Hypertension: Blood pressure elevated on presentation.  Patient received Norvasc 10 mg in the ED.  Resume home  medications.  Hydralazine as needed available.  8. Vascular dementia/depression: According to sisters patient has had cognitive decline since CVA in 2016.  CT also shows mild atrophy.  Resume home medications.  May need home health services upon discharge.  DVT prophylaxis: Heparin  COVID screen: Pending  Code Status: Full code.Health care proxy would be his sisters [name] and [name]  Patient/Family Communication: Discussed with patient and both his sisters, all questions answered to satisfaction.  Consults called: None. Admission status :I certify that at the point of admission it is my  clinical judgment that the patient will require inpatient hospital care spanning beyond 2 midnights from the point of admission due to high intensity of service and high frequency of surveillance required.Inpatient status is judged to be reasonable and necessary in order to provide the required intensity of service to ensure the patient's safety. The patient's presenting symptoms, physical exam findings, and initial radiographic and laboratory data in the context of their chronic comorbidities is felt to place them at high risk for further clinical deterioration. The following factors support the patient status of inpatient : Refractory hypoglycemia with associated metabolic encephalopathy requiring IV dextrose fluids and insulin dose adjustment.  Patient also with dehydration, AKI requiring IV fluids.     Guilford Shi MD [facility] Pager in Page  If 7PM-7AM, please contact night-coverage [email or web address]   11/22/2019, 6:31 PM

## 2019-11-22 NOTE — ED Notes (Signed)
Pt remains alert and states he feels when his BG is low, mouth becomes dry.  Admitting MD paged again.  Previously paged with no response with lab results.  ED MD aware.

## 2019-11-22 NOTE — ED Triage Notes (Signed)
Reports of AMS, slurred speech and less reponsive this AM.  BG 33 by EMS.  Glucagon IM given and EJ started with 50 of Dextrose given.

## 2019-11-22 NOTE — ED Provider Notes (Signed)
RN indicates awaiting admit orders, and she is unsure if earlier APP arranged admission with hospitalist.   CBG - low. d50 1 amp iv, and d10ns gtt. Encourage pt to eat to help prevent recurrent hypoglycemia.   k low, kcl iv. Mg added to labs.   Hospitalist called - she indicates is placing orders now and is on way to admit.      Lajean Saver, MD 11/22/19 [phone]

## 2019-11-22 NOTE — Progress Notes (Signed)
Pt stated that it was okay to speak with sister Fraser Din. Gave her an update.

## 2019-11-22 NOTE — ED Provider Notes (Signed)
[place] EMERGENCY DEPARTMENT Provider Note   CSN: [id number] Arrival date & time: 11/22/19  1105     History No chief complaint on file.   [name] [name] is a 74 y.o. male   HPI  Patient is a 74 year old diabetic male with history of DKA, HHS, hypoglycemia and stroke.  Also with history of Alzheimer's and currently lives alone although his sister is come to his house daily to provide him his medications and check on him. He is a poor historian likely at least partially due to patient's Alzheimer's disease.  He is pleasant but unable to provide detailed history--however he does not seem acutely altered and is able to answer yes/no questions.  He is hard of hearing.  Notably in patients EMR he was noted to be "a brittle diabetic"  [name] [name] (sister) states she went to see patient this morning and he was "unresponsive" which she describes as breathing and moving a little bit but not answering questions. She states EMS found BS to be 50 and started an IV and gave D50. Sister states pt's BS has been 78-50 for the past week without insulin.  Per sister, patient was last given insulin this past Thursday and has not had insulin since that time because of his low blood sugars.  He has been receiving glipizide as prescribed.  Per sister patient does not have any known medications at his house does not have access to any medications that would cause hypoglycemia.  States the patient has not complained of any symptoms such as cough, congestion, headache, dizziness, lightheadedness, fevers, chills, abdominal pain, chest pain, shortness of breath, diarrhea or urinary problems.     Past Medical History:  Diagnosis Date  . 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 Closed comminuted intertrochanteric fracture of left femur ([place])   . Diabetes mellitus without complication ([place])   . Diabetic hyperosmolar non-ketotic state ([place]) 05/15/2016  . DKA  (diabetic ketoacidoses) ([place]) 05/09/2016  . Hypertension   . Postoperative anemia due to acute blood loss 06/18/2016  . Retroperitoneal hematoma 06/18/2016  . Stroke ([place])   . Vitamin B 12 deficiency 06/18/2016    Patient Active Problem List   Diagnosis Date Noted  . ETOH abuse 05/17/2019  . Chronic diastolic CHF (congestive heart failure) ([place]) 05/17/2019  . DKA (diabetic ketoacidoses) ([place]) 05/17/2019  . Fall 10/03/2018  . Uncontrolled type 2 diabetes mellitus with hyperglycemia, with long-term current use of insulin ([place]) 10/03/2018  . Chest wall contusion 10/03/2018  . Underweight 11/18/2017  . Normocytic anemia 11/18/2017  . DKA, type 2 ([place]) 08/01/2017  . History of cerebrovascular accident (CVA) in adulthood 07/25/2017  . HTN (hypertension) 07/25/2017  . Depression with anxiety 07/25/2017  . Left ventricular diastolic dysfunction 29/52/8413  . Dementia ([place]) 07/25/2017  . 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 Diabetes mellitus, insulin dependent (IDDM), controlled 06/27/2017  . Hypertension 06/27/2017  . Hyperkalemia 01/16/2017  . Weakness 11/16/2016  . Acute renal failure superimposed on stage 3 chronic kidney disease ([place])   . Noncompliance with medication regimen 08/17/2016  . History of DVT (deep vein thrombosis) 07/06/2016  . Vitamin B 12 deficiency 06/18/2016  . Hyperglycemia 05/15/2016  . Diabetic hyperosmolar non-ketotic state ([place]) 05/15/2016  . Hyponatremia 05/15/2016  . Anxiety 05/15/2016  . Narcotic dependency, continuous ([place]) 11/02/2015  . Uncontrolled type 2 diabetes mellitus with diabetic nephropathy, with long-term current use of insulin ([place]) 08/12/2015  . Depression 08/12/2015  .  CKD (chronic kidney disease) stage 3, GFR 30-59 ml/min ([name]) 03/20/2014    Past Surgical History:  Procedure Laterality Date  . HIP ARTHROPLASTY Right 02/05/2013   Procedure: ARTHROPLASTY BIPOLAR HIP;  Surgeon: [name], MD;  Location: [facility];  Service:  Orthopedics;  Laterality: Right;  . INTRAMEDULLARY (IM) NAIL INTERTROCHANTERIC Left 06/16/2016   Procedure: INTRAMEDULLARY (IM) NAIL INTERTROCHANTRIC;  Surgeon: [name] Can, MD;  Location: [facility];  Service: Orthopedics;  Laterality: Left;       Family History  Problem Relation Age of Onset  . Diabetes Mother   . Alzheimer's disease Mother   . Hypertension Mother   . Hyperlipidemia Father   . Hypertension Father   . Healthy Maternal Grandmother   . Pneumonia Maternal Grandfather     Social History   Tobacco Use  . Smoking status: Former Smoker    Packs/day: 0.25    Years: 30.00    Pack years: 7.50    Types: Cigarettes    Quit date: 02/01/2016    Years since quitting: 3.8  . Smokeless tobacco: Never Used  Substance Use Topics  . Alcohol use: No  . Drug use: No    Home Medications Prior to Admission medications   Medication Sig Start Date End Date Taking? Authorizing Provider  amLODipine (NORVASC) 10 MG tablet TAKE 1 TABLET ONCE DAILY. 09/28/19  Yes Azzie Glatter, [name]  atorvastatin (LIPITOR) 10 MG tablet Take 1 tablet (10 mg total) by mouth daily. 11/02/19  Yes Azzie Glatter, [name]  FLUoxetine (PROZAC) 40 MG capsule Take 1 capsule (40 mg total) by mouth daily. 11/09/19  Yes Azzie Glatter, [name]  glipiZIDE (GLUCOTROL) 10 MG tablet Take 1 tablet (10 mg total) by mouth 2 (two) times daily before a meal. 03/16/19  Yes Azzie Glatter, [name]  NOVOLOG MIX 70/30 (70-30) 100 UNIT/ML injection Inject 0-10 Units into the skin at bedtime.  05/16/19  Yes [provider]  Vitamin D, Ergocalciferol, (DRISDOL) 1.25 MG (50000 UNIT) CAPS capsule Take 1 capsule (50,000 Units total) by mouth once a week. 11/09/19  Yes Azzie Glatter, [name]  ACCU-CHEK GUIDE test strip USE AS DIRECTED 11/21/19   Azzie Glatter, [name]  Blood Glucose Monitoring Suppl (ACCU-CHEK GUIDE) w/Device KIT 1 Device by Does not apply route QID. 11/02/19   Azzie Glatter, [name]  glucose blood (ACCU-CHEK GUIDE) test  strip Use as instructed 11/03/18   Azzie Glatter, [name]  glucose blood (TRUE METRIX BLOOD GLUCOSE TEST) test strip CHECK BLOOD SUGAR UP TO 4 TIMES A DAY. 05/25/18   Azzie Glatter, [name]  Insulin Glargine (LANTUS SOLOSTAR) 100 UNIT/ML Solostar Pen Inject 50 Units into the skin daily. Patient taking differently: Inject 6-12 Units into the skin daily.  11/02/19   Azzie Glatter, [name]  insulin lispro (HUMALOG) 100 UNIT/ML injection Inject 30 units of insulin before meals and at bedtime for glucose levels >125 Patient taking differently: Inject 0-12 Units into the skin 3 (three) times daily with meals. Inject 30 units of insulin before meals and at bedtime for glucose levels >125 11/02/19 11/01/20  Azzie Glatter, [name]  Insulin Pen Needle (B-D ULTRAFINE III SHORT PEN) 31G X 8 MM MISC USE WITH INJECTIONS 11/02/19   Azzie Glatter, [name]  Insulin Syringe-Needle U-100 (BD INSULIN SYRINGE U/F) 31G X 5/16" 0.3 ML MISC Inject 1 each as directed 2 (two) times daily. as directed 11/02/19   Azzie Glatter, [name]  metFORMIN (GLUCOPHAGE) 500 MG tablet Take  1 tablet (500 mg total) by mouth 2 (two) times daily with a meal. Patient not taking: Reported on 05/17/2019 03/16/19   Azzie Glatter, [name]    Allergies    Patient has no known allergies.  Review of Systems   Review of Systems  Constitutional: Negative for chills and fever.  HENT: Negative for congestion.   Eyes: Negative for pain.  Respiratory: Negative for cough and shortness of breath.   Cardiovascular: Negative for chest pain and leg swelling.  Gastrointestinal: Negative for abdominal pain and vomiting.  Genitourinary: Negative for dysuria.  Musculoskeletal: Negative for myalgias.  Skin: Negative for rash.  Neurological: Negative for dizziness and headaches.    Physical Exam Updated Vital Signs BP (!) 179/82   Pulse 64   Temp 98.2 F (36.8 C) (Oral)   Resp 14   Ht _0  (1.778 m)   Wt 63.5 kg   SpO2 100%   BMI 20.09 kg/m   Physical  Exam Vitals and nursing note reviewed.  Constitutional:      General: He is not in acute distress.    Comments: 74 year old male appears stated age no acute distress  HENT:     Head: Normocephalic and atraumatic.     Nose: Nose normal.     Mouth/Throat:     Mouth: Mucous membranes are dry.  Eyes:     General: No scleral icterus. Cardiovascular:     Rate and Rhythm: Normal rate and regular rhythm.     Pulses: Normal pulses.     Heart sounds: Normal heart sounds.  Pulmonary:     Effort: Pulmonary effort is normal. No respiratory distress.     Breath sounds: No wheezing.  Abdominal:     Palpations: Abdomen is soft.     Tenderness: There is no abdominal tenderness. There is no right CVA tenderness, left CVA tenderness, guarding or rebound.  Musculoskeletal:     Cervical back: Normal range of motion.     Right lower leg: No edema.     Left lower leg: No edema.  Skin:    General: Skin is warm and dry.     Capillary Refill: Capillary refill takes less than 2 seconds.  Neurological:     Mental Status: He is alert. Mental status is at baseline.     Comments: Alert and oriented to self, place, time and event.   Speech is fluent, clear without dysarthria or dysphasia.   Strength 5/5 in upper/lower extremities  Sensation intact in upper/lower extremities   Deferred gait analysis this patient does appear to be somewhat weak. Negative Romberg. No pronator drift.  Normal finger-to-nose and feet tapping.  CN I not tested  CN II grossly intact visual fields bilaterally. Did not visualize posterior eye.   CN III, IV, VI PERRLA and EOMs intact bilaterally  CN V Intact sensation to sharp and light touch to the face  CN VII facial movements symmetric  CN VIII not tested  CN IX, X no uvula deviation, symmetric rise of soft palate  CN XI 5/5 SCM and trapezius strength bilaterally  CN XII Midline tongue protrusion, symmetric L/R movements   Psychiatric:        Mood and Affect: Mood  normal.        Behavior: Behavior normal.     ED Results / Procedures / Treatments   Labs (all labs ordered are listed, but only abnormal results are displayed) Labs Reviewed  CBC - Abnormal; Notable for the following components:  Result Value   WBC 12.6 (*)    RBC 3.97 (*)    Hemoglobin 11.6 (*)    HCT 35.7 (*)    RDW 16.3 (*)    All other components within normal limits  URINALYSIS, ROUTINE W REFLEX MICROSCOPIC - Abnormal; Notable for the following components:   Color, Urine STRAW (*)    Glucose, UA 150 (*)    Hgb urine dipstick SMALL (*)    Protein, ur 100 (*)    All other components within normal limits  CBG MONITORING, ED - Abnormal; Notable for the following components:   Glucose-Capillary 156 (*)    All other components within normal limits  CBG MONITORING, ED - Abnormal; Notable for the following components:   Glucose-Capillary 16 (*)    All other components within normal limits  CBG MONITORING, ED - Abnormal; Notable for the following components:   Glucose-Capillary 25 (*)    All other components within normal limits  CBG MONITORING, ED - Abnormal; Notable for the following components:   Glucose-Capillary 146 (*)    All other components within normal limits  URINE CULTURE  SARS CORONAVIRUS 2 (TAT 6-24 HRS)  COMPREHENSIVE METABOLIC PANEL  COMPREHENSIVE METABOLIC PANEL    EKG None  Radiology CT Head Wo Contrast  Result Date: 11/22/2019 CLINICAL DATA:  Altered mental status. EXAM: CT HEAD WITHOUT CONTRAST TECHNIQUE: Contiguous axial images were obtained from the base of the skull through the vertex without intravenous contrast. COMPARISON:  January 27, 2019. FINDINGS: Brain: Mild diffuse cortical atrophy is noted. Mild chronic ischemic white matter disease is noted. No mass effect or midline shift is noted. Ventricular size is within normal limits. There is no evidence of mass lesion, hemorrhage or acute infarction. Vascular: No hyperdense vessel or unexpected  calcification. Skull: Normal. Negative for fracture or focal lesion. Sinuses/Orbits: No acute finding. Other: None. IMPRESSION: Mild diffuse cortical atrophy. Mild chronic ischemic white matter disease. No acute intracranial abnormality seen. Electronically Signed   By: Marijo Conception M.D.   On: 11/22/2019 16:34   DG Chest Portable 1 View  Result Date: 11/22/2019 CLINICAL DATA:  Altered level of consciousness, leukocytosis EXAM: PORTABLE CHEST 1 VIEW COMPARISON:  05/17/2019 FINDINGS: The heart size and mediastinal contours are within normal limits. Both lungs are clear. The visualized skeletal structures are unremarkable. IMPRESSION: No active disease. Electronically Signed   By: Randa Ngo M.D.   On: 11/22/2019 15:13    Procedures Procedures (including critical care time) CRITICAL CARE Performed by: Tedd Sias   Total critical care time: 35 minutes  Critical care time was exclusive of separately billable procedures and treating other patients.  Critical care was necessary to treat or prevent imminent or life-threatening deterioration.  Critical care was time spent personally by me on the following activities: development of treatment plan with patient and/or surrogate as well as nursing, discussions with consultants, evaluation of patient's response to treatment, examination of patient, obtaining history from patient or surrogate, ordering and performing treatments and interventions, ordering and review of laboratory studies, ordering and review of radiographic studies, pulse oximetry and re-evaluation of patient's condition.  Numerous repeat CBG monitoring orders were required and discussions with family members due to patient's Alzheimer's.  Patient has critical hypoglycemia requiring bolus and infusion of dextrose.  Medications Ordered in ED Medications  dextrose 5 %-0.9 % sodium chloride infusion (has no administration in time range)  sodium chloride flush (NS) 0.9 % injection 3  mL (3 mLs Intravenous Given 11/22/19  1613)  amLODipine (NORVASC) tablet 10 mg (10 mg Oral Given 11/22/19 1652)  dextrose 50 % solution (  Given 11/22/19 1606)    ED Course  I have reviewed the triage vital signs and the nursing notes.  Pertinent labs & imaging results that were available during my care of the patient were reviewed by me and considered in my medical decision making (see chart for details).    MDM Rules/Calculators/A&P                      [name] is a 74 year old diabetic patient with a history of Alzheimer's and stroke negative for hypoglycemic episode occurred at home when he was found by EMS to have a blood sugar of 50.  On physical exam he has no focal neurologic deficits or lateralizing neurologic deficits.  He is hypertensive has not taken his morning medications per sister-we will provide patient with a dose of his home Norvasc.  Patient's blood work required approximately 5 hours to required two hours to obtain d/t hemolyzed specimens x2.  While waiting in the ED patient's voice appeared to become somewhat slurred on my reevaluation at bedside and repeat CBG showed BS of 16--25 on recheck.  He was given D50 and placed on glucose drip.  Blood sugar improved to 146 however this pain the second time his blood sugar drops during his ED stay for observation and possibly titration of his diabetes medications.   5:02 PM discussed case with Dr.Kamineni of [facility] hospitalist.  She will admit patient to hospital.  She recommends starting patient on D5 normal saline at 75 mL/h -- appreciate her expert consultation.  CMP is pending at this time due to issues with hemolysis. Dr.Kamineni will admit the patient to the hospital.  CMP is pending she states that she will make alterations to patient fluids replete electrolytes as needed based on CMP results.  CBG monitoring ordered and reviewed with nursing staff importance of neurochecks to monitor patient's mental status.  Covid swab  ordered and pending.  [name] [name] was evaluated in Emergency Department on 11/22/2019 for the symptoms described in the history of present illness. He was evaluated in the context of the global COVID-19 pandemic, which necessitated consideration that the patient might be at risk for infection with the SARS-CoV-2 virus that causes COVID-19. Institutional protocols and algorithms that pertain to the evaluation of patients at risk for COVID-19 are in a state of rapid change based on information released by regulatory bodies including the CDC and federal and state organizations. These policies and algorithms were followed during the patient's care in the ED.  Final Clinical Impression(s) / ED Diagnoses Final diagnoses:  Hypoglycemia    Rx / DC Orders ED Discharge Orders    None       Tedd Sias, [place] 11/22/19 2052    Pattricia Boss, MD 11/28/19 1450

## 2019-11-23 DIAGNOSIS — G9341 Metabolic encephalopathy: Secondary | ICD-10-CM

## 2019-11-23 LAB — COMPREHENSIVE METABOLIC PANEL
ALT: 18 U/L (ref 0–44)
AST: 30 U/L (ref 15–41)
Albumin: 2.4 g/dL — ABNORMAL LOW (ref 3.5–5.0)
Alkaline Phosphatase: 56 U/L (ref 38–126)
Anion gap: 11 (ref 5–15)
BUN: 26 mg/dL — ABNORMAL HIGH (ref 8–23)
CO2: 18 mmol/L — ABNORMAL LOW (ref 22–32)
Calcium: 7.9 mg/dL — ABNORMAL LOW (ref 8.9–10.3)
Chloride: 110 mmol/L (ref 98–111)
Creatinine, Ser: 2.19 mg/dL — ABNORMAL HIGH (ref 0.61–1.24)
GFR calc Af Amer: 33 mL/min — ABNORMAL LOW (ref >60)
GFR calc non Af Amer: 29 mL/min — ABNORMAL LOW (ref >60)
Glucose, Bld: 170 mg/dL — ABNORMAL HIGH (ref 70–99)
Potassium: 3.8 mmol/L (ref 3.5–5.1)
Sodium: 139 mmol/L (ref 135–145)
Total Bilirubin: 0.8 mg/dL (ref 0.3–1.2)
Total Protein: 5.5 g/dL — ABNORMAL LOW (ref 6.5–8.1)

## 2019-11-23 LAB — URINE CULTURE: Culture: NO GROWTH

## 2019-11-23 LAB — GLUCOSE, CAPILLARY
Glucose-Capillary: 333 mg/dL — ABNORMAL HIGH (ref 70–99)
Glucose-Capillary: 483 mg/dL — ABNORMAL HIGH (ref 70–99)
Glucose-Capillary: 339 mg/dL — ABNORMAL HIGH (ref 70–99)
Glucose-Capillary: 205 mg/dL — ABNORMAL HIGH (ref 70–99)
Glucose-Capillary: 299 mg/dL — ABNORMAL HIGH (ref 70–99)
Glucose-Capillary: 404 mg/dL — ABNORMAL HIGH (ref 70–99)
Glucose-Capillary: 215 mg/dL — ABNORMAL HIGH (ref 70–99)
Glucose-Capillary: 141 mg/dL — ABNORMAL HIGH (ref 70–99)
Glucose-Capillary: 211 mg/dL — ABNORMAL HIGH (ref 70–99)
Glucose-Capillary: 220 mg/dL — ABNORMAL HIGH (ref 70–99)

## 2019-11-23 LAB — TSH: TSH: 3.203 u[IU]/mL (ref 0.350–4.500)

## 2019-11-23 LAB — VITAMIN B12: Vitamin B-12: 231 pg/mL (ref 180–914)

## 2019-11-23 MED ORDER — INSULIN DETEMIR 100 UNIT/ML ~~LOC~~ SOLN
10.0000 [IU] | Freq: Every day | SUBCUTANEOUS | Status: DC
  Administered 2019-11-24 – 2019-11-25 (×2): 10 [IU] via SUBCUTANEOUS
  Filled 2019-11-23 (×2): qty 0.1

## 2019-11-23 MED ORDER — GLUCERNA SHAKE PO LIQD
237.0000 mL | Freq: Two times a day (BID) | ORAL | Status: DC
  Administered 2019-11-23 – 2019-11-25 (×4): 237 mL via ORAL

## 2019-11-23 MED ORDER — INFLUENZA VAC A&B SA ADJ QUAD 0.5 ML IM PRSY
0.5000 mL | PREFILLED_SYRINGE | INTRAMUSCULAR | Status: AC
  Administered 2019-11-24: 0.5 mL via INTRAMUSCULAR
  Filled 2019-11-23: qty 0.5

## 2019-11-23 MED ORDER — INSULIN ASPART 100 UNIT/ML ~~LOC~~ SOLN
5.0000 [IU] | Freq: Once | SUBCUTANEOUS | Status: AC
  Administered 2019-11-23: 5 [IU] via SUBCUTANEOUS

## 2019-11-23 MED ORDER — INSULIN ASPART 100 UNIT/ML ~~LOC~~ SOLN
0.0000 [IU] | Freq: Three times a day (TID) | SUBCUTANEOUS | Status: DC
  Administered 2019-11-23: 9 [IU] via SUBCUTANEOUS
  Administered 2019-11-23: 1 [IU] via SUBCUTANEOUS
  Administered 2019-11-24: 7 [IU] via SUBCUTANEOUS
  Administered 2019-11-24: 3 [IU] via SUBCUTANEOUS

## 2019-11-23 MED ORDER — INSULIN DETEMIR 100 UNIT/ML ~~LOC~~ SOLN
5.0000 [IU] | Freq: Every day | SUBCUTANEOUS | Status: DC
  Administered 2019-11-23: 5 [IU] via SUBCUTANEOUS
  Filled 2019-11-23: qty 0.05

## 2019-11-23 MED ORDER — INSULIN GLARGINE 100 UNIT/ML ~~LOC~~ SOLN
5.0000 [IU] | Freq: Once | SUBCUTANEOUS | Status: AC
  Administered 2019-11-23: 5 [IU] via SUBCUTANEOUS
  Filled 2019-11-23 (×2): qty 0.05

## 2019-11-23 NOTE — Progress Notes (Signed)
Inpatient Diabetes Program Recommendations  AACE/ADA: New Consensus Statement on Inpatient Glycemic Control (2015)  Target Ranges:  Prepandial:   less than 140 mg/dL      Peak postprandial:   less than 180 mg/dL (1-2 hours)      Critically ill patients:  140 - 180 mg/dL   Results for RODRIGUEZ, SEDORE (MRN [id number]) as of 11/23/2019 07:32  Ref. Range 11/22/2019 11:06 11/22/2019 15:57 11/22/2019 15:58 11/22/2019 16:10 11/22/2019 18:25 11/22/2019 18:43 11/22/2019 19:49 11/22/2019 21:51 11/22/2019 22:34  Glucose-Capillary Latest Ref Range: 70 - 99 mg/dL 156 (H) 16 (LL) 25 (LL)  D50% given 146 (H) 32 (LL)  D50% given 115 (H) 84 54 (L)  D50% given 133 (H)   Results for KALIQ, CONGLETON (MRN [id number]) as of 11/23/2019 07:32  Ref. Range 11/22/2019 23:41 11/23/2019 01:03 11/23/2019 03:08 11/23/2019 05:44  Glucose-Capillary Latest Ref Range: 70 - 99 mg/dL 211 (H) 333 (H) 483 (H)  5 units NOVOLOG given at 4am 339 (H)   Results for KAMRYNN, LUTY (MRN [id number]) as of 11/23/2019 07:32  Ref. Range 11/09/2019 09:09  Hemoglobin A1C Latest Ref Range: 4.0 - 5.6 % 11.5 (A)    Admit with: AMS/ Hypoglycemia  History: DM, CVA, CKA  Home DM Meds: Lantus 6-12 units Daily       Humalog 0-12 units TID with meals       70/30 Insulin 0-10 units QHS       Glipizide 10 mg BID       Metformin (NOT taking)  Current Orders: None yet--CBG Checks Q2 hours    Getting D5 1/2 NS at 75cc/hr.   MD- Note patient admitted with Severe Hypoglycemia.  Unsure why patient taking 70/30 Insulin, Humalog, and Lantus.  Looks like he needs simplification of home Diabetes med regimen in order to prevent mistakes and further issues with Hypoglycemia.  CBG 483 at 3am today--5 units Novolog given.  Please consider the following:  1. Start Lantus 6 units Daily this AM (0.1 units/kg)  2. Start Novolog Sensitive Correction Scale/ SSI (0-9 units) TID AC + HS  3. May want to Stop the Glipizide, Metformin, and 70/30 Insulin from the home meds and simplify  regimen to Lantus and Humalog only.  Patient may benefit from simple SSI regimen to take at home--Family members can check his CBGs and give Humalog based on his CBG     --Will follow patient during hospitalization--  Wyn Quaker RN, MSN, CDE Diabetes Coordinator Inpatient Glycemic Control Team Team Pager: [phone] (8a-5p)

## 2019-11-23 NOTE — Evaluation (Signed)
Physical Therapy Evaluation Patient Details Name: [name] [name] MRN: [id number] DOB: [birth year] Today's Date: 11/23/2019   History of Present Illness  74 year old with history of HTN, DM2-insulin-dependent, CVA 2016, CKD stage IIIb, chronic anemia, cognitive impairment secondary to CVA brought to the hospital for change in mental status.  He was found to be hypoglycemic which improved after D50.  Admitted to the hospital for closer monitoring and concerns of dehydration.  In the meantime allowing medication to washout.  Clinical Impression  Pt admitted with above diagnosis. Pt was able to ambulate with RW with min guard assist  And without with min assist as pt is unsteady wihtout device.  Pt adamant that he is "not going to use a RW at home because he will do what he wants." Reiterated to pt safety concerns and that he needs to use RW at all times. Will continue PT.  Pt currently with functional limitations due to the deficits listed below (see PT Problem List). Pt will benefit from skilled PT to increase their independence and safety with mobility to allow discharge to the venue listed below.      Follow Up Recommendations Home health PT;Supervision - Intermittent    Equipment Recommendations  Rolling walker with 5" wheels    Recommendations for Other Services       Precautions / Restrictions Precautions Precautions: Fall Restrictions Weight Bearing Restrictions: No      Mobility  Bed Mobility Overal bed mobility: Needs Assistance Bed Mobility: Supine to Sit     Supine to sit: Independent        Transfers Overall transfer level: Independent                  Ambulation/Gait Ambulation/Gait assistance: Min guard;Min assist Gait Distance (Feet): 400 Feet Assistive device: Rolling walker (2 wheeled);None Gait Pattern/deviations: Step-through pattern;Decreased stride length;Trunk flexed;Wide base of support;Drifts right/left;Staggering left;Staggering right   Gait  velocity interpretation: <1.31 ft/sec, indicative of household ambulator General Gait Details: Pt unsteady staggering without device initially therefore obtained RW and pt walked in [place] with RW with min guard assist with cues to stay close to RW as well as cues with turns.  Pt kept stating he doesnt need the RW at home.  Attempted to walk again without the RW with pt staggering around and needing min assist to recover.  Pt also with DOE with RW 1/4 but without device, DOE 4/4 and he had to go sit down.  RW would assist pt with his energy conservation as well.  Reinforced to pt multiple times that he needs to use RW at home.  Pt states, " I will do what I want."  Reiterated to pt he is a fall risk.    Stairs            Wheelchair Mobility    Modified Rankin (Stroke Patients Only)       Balance Overall balance assessment: Needs assistance Sitting-balance support: No upper extremity supported;Feet supported Sitting balance-Leahy Scale: Fair     Standing balance support: Bilateral upper extremity supported;During functional activity;No upper extremity supported Standing balance-Leahy Scale: Poor Standing balance comment: relies on UE support for balance.                              Pertinent Vitals/Pain Pain Assessment: No/denies pain    Home Living Family/patient expects to be discharged to:: Private residence Living Arrangements: Alone Available Help at Discharge: Family;Available PRN/intermittently Type of  Home: Apartment Home Access: Elevator     Home Layout: One level Home Equipment: None      Prior Function Level of Independence: Independent         Comments: sister gets groceries and meds     Hand Dominance   Dominant Hand: Right    Extremity/Trunk Assessment   Upper Extremity Assessment Upper Extremity Assessment: Defer to OT evaluation    Lower Extremity Assessment Lower Extremity Assessment: Generalized weakness    Cervical / Trunk  Assessment Cervical / Trunk Assessment: Kyphotic  Communication   Communication: Expressive difficulties(slurred speech)  Cognition Arousal/Alertness: Awake/alert Behavior During Therapy: Impulsive;Restless Overall Cognitive Status: History of cognitive impairments - at baseline                                 General Comments: Pt impulsive and poor safety awareness overall.       General Comments      Exercises     Assessment/Plan    PT Assessment Patient needs continued PT services  PT Problem List Decreased balance;Decreased activity tolerance;Decreased mobility;Decreased knowledge of use of DME;Decreased safety awareness;Decreased knowledge of precautions;Cardiopulmonary status limiting activity       PT Treatment Interventions DME instruction;Gait training;Stair training;Functional mobility training;Therapeutic activities;Therapeutic exercise;Balance training;Patient/family education    PT Goals (Current goals can be found in the Care Plan section)  Acute Rehab PT Goals Patient Stated Goal: to go home PT Goal Formulation: With patient Time For Goal Achievement: 12/07/19 Potential to Achieve Goals: Good    Frequency Min 3X/week   Barriers to discharge Decreased caregiver support      Co-evaluation               AM-PAC PT "6 Clicks" Mobility  Outcome Measure Help needed turning from your back to your side while in a flat bed without using bedrails?: None Help needed moving from lying on your back to sitting on the side of a flat bed without using bedrails?: None Help needed moving to and from a bed to a chair (including a wheelchair)?: None Help needed standing up from a chair using your arms (e.g., wheelchair or bedside chair)?: A Little Help needed to walk in hospital room?: A Little Help needed climbing 3-5 steps with a railing? : A Little 6 Click Score: 21    End of Session Equipment Utilized During Treatment: Gait belt Activity  Tolerance: Patient limited by fatigue Patient left: in bed;with call bell/phone within reach;with bed alarm set Nurse Communication: Mobility status PT Visit Diagnosis: Unsteadiness on feet (R26.81)    Time: [id number] PT Time Calculation (min) (ACUTE ONLY): 12 min   Charges:   PT Evaluation $PT Eval Moderate Complexity: 1 Mod          Dusan Lipford W,PT Acute Rehabilitation Services Pager:  [phone]  Office:  [phone]    Denice Paradise 11/23/2019, 2:15 PM

## 2019-11-23 NOTE — Progress Notes (Signed)
Initial Nutrition Assessment   RD working remotely.  DOCUMENTATION CODES:   Not applicable  INTERVENTION:  Provide Glucerna Shake po BID, each supplement provides 220 kcal and 10 grams of protein  Encourage adequate PO intake.   NUTRITION DIAGNOSIS:   Increased nutrient needs related to acute illness as evidenced by estimated needs.  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  He was found to be hypoglycemic which improved after D50. Pt with acute metabolic encephalopathy secondary to hypoglycemia.  Pt unavailable during attempted time of contact. Meal completion has been 100%. RD to order nutritional supplements to aid in increased caloric and protein needs. MD and care team continue to monitor blood glucose. Insulin and diabetes medication to be adjusted upon discharge home.   Unable to complete Nutrition-Focused physical exam at this time.   Labs and medications reviewed.   Diet Order:   Diet Order            Diet heart healthy/carb modified Room service appropriate? Yes; Fluid consistency: Thin  Diet effective now              EDUCATION NEEDS:   Not appropriate for education at this time  Skin:  Skin Assessment: Reviewed RN Assessment  Last BM:  2/10  Height:   Ht Readings from Last 1 Encounters:  11/22/19 5\' 10"  (1.778 m)    Weight:   Wt Readings from Last 1 Encounters:  11/22/19 63.5 kg    Ideal Body Weight:  75.45 kg  BMI:  Body mass index is 20.09 kg/m.  Estimated Nutritional Needs:   Kcal:  1900-2100  Protein:  90-100 grams  Fluid:  >/= 1.9  L/day  Corrin Parker, MS, RD, LDN RD pager number/after hours weekend pager number on Amion.

## 2019-11-23 NOTE — Progress Notes (Signed)
Sent message about pts blood sugar increasing to 400s. Waiting for a response.

## 2019-11-23 NOTE — Progress Notes (Signed)
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  He was found to be hypoglycemic which improved after D50.  Admitted to the hospital for closer monitoring and concerns of dehydration.  In the meantime allowing medication to washout.   Assessment & Plan:   Principal Problem:   Acute metabolic encephalopathy Active Problems:   Vitamin B 12 deficiency   Hypoglycemia due to insulin   Normocytic anemia   Uncontrolled type 2 diabetes mellitus with hyperglycemia, with long-term current use of insulin ([name])   CVA (cerebral vascular accident) ([place])   Cognitive decline   Metabolic encephalopathy  Acute metabolic encephalopathy secondary to hypoglycemia Uncontrolled diabetes mellitus type 2, insulin-dependent -Hypoglycemia is improved so his mentation.  CT of the head shows chronic infarct otherwise no acute pathology. -Home p.o. medications stopped.  Lantus and sliding scale added -Hemoglobin A1c 11.5 -Continue to monitor blood glucose.  In the meantime await his p.o. intake to improve and stay steady.  Blood glucose is still very labile.  AKI on CKD stage III. -Baseline creatinine 1.8.  Slowly improving with fluids.  History of CVA -Aspirin and statin  Essential hypertension -As needed hydralazine  History of vascular dementia/depression History of CVA -CT of the head mild atrophy of the brain.  Supportive care.  Chronic normocytic anemia -Hemoglobin stable no active signs of bleeding  PT/OT  DVT prophylaxis: Subcutaneous heparin Code Status: Full code Family Communication:  [name] updated.  Disposition Plan:   Patient From= Home  Patient Anticipated D/C place= Home  Barriers= Very labile blood glucose. Unstable  for discharge.   Subjective: Seen and examined at bedside, wishes to go home as he feels better but I explained to him that his sugars are still very labile and his p.o. intake is not necessarily consistent.  Review of Systems Otherwise negative except as per HPI, including: General: Denies fever, chills, night sweats or unintended weight loss. Resp: Denies cough, wheezing, shortness of breath. Cardiac: Denies chest pain, palpitations, orthopnea, paroxysmal nocturnal dyspnea. GI: Denies abdominal pain, nausea, vomiting, diarrhea or constipation GU: Denies dysuria, frequency, hesitancy or incontinence MS: Denies muscle aches, joint pain or swelling Neuro: Denies headache, neurologic deficits (focal weakness, numbness, tingling), abnormal gait Psych: Denies anxiety, depression, SI/HI/AVH Skin: Denies new rashes or lesions ID: Denies sick contacts, exotic exposures, travel  Examination:  General exam: Appears calm and comfortable  Respiratory system: Clear to auscultation. Respiratory effort normal. Cardiovascular system: S1 & S2 heard, RRR. No JVD, murmurs, rubs, gallops or clicks. No pedal edema. Gastrointestinal system: Abdomen is nondistended, soft and nontender. No organomegaly or masses felt. Normal bowel sounds heard. Central nervous system: Alert and oriented. No focal neurological deficits. Extremities: Symmetric 4 x 5 power. Skin: No rashes, lesions or ulcers Psychiatry: Judgement and insight appear Poor. Mood & affect appropriate.     Objective: Vitals:   11/22/19 2030 11/22/19 2100 11/22/19 2126 11/23/19 0756  BP: (!) 161/89 (!) 157/81 (!) 182/83 (!) 146/73  Pulse: 68  63 76  Resp: (!) 23 (!) 22 20 16   Temp:   98.4 F (36.9 C) 99.7 F (37.6 C)  TempSrc:      SpO2: 97%  100% 97%  Weight:      Height:  Intake/Output Summary (Last 24 hours) at 11/23/2019 1303 Last data filed at 11/23/2019 1003 Gross per 24 hour  Intake 1738.24 ml  Output 610 ml  Net  1128.24 ml   Filed Weights   11/22/19 1109  Weight: 63.5 kg     Data Reviewed:   CBC: Recent Labs  Lab 11/22/19 1250 11/22/19 2310  WBC 12.6* 10.5  HGB 11.6* 11.1*  HCT 35.7* 34.4*  MCV 89.9 91.0  PLT 168 [id number]   Basic Metabolic Panel: Recent Labs  Lab 11/22/19 1446 11/22/19 1600 11/22/19 1818 11/22/19 2310  NA SPECIMEN HEMOLYZED. HEMOLYSIS MAY AFFECT INTEGRITY OF RESULTS. 143  --  [place] HEMOLYSIS MAY AFFECT INTEGRITY OF RESULTS. 2.7*  --  3.8  CL SPECIMEN HEMOLYZED. HEMOLYSIS MAY AFFECT INTEGRITY OF RESULTS. 111  --  110  CO2 SPECIMEN HEMOLYZED. HEMOLYSIS MAY AFFECT INTEGRITY OF RESULTS. 23  --  18*  GLUCOSE SPECIMEN HEMOLYZED. HEMOLYSIS MAY AFFECT INTEGRITY OF RESULTS. 36*  --  170*  BUN SPECIMEN HEMOLYZED. HEMOLYSIS MAY AFFECT INTEGRITY OF RESULTS. 27*  --  26*  CREATININE SPECIMEN HEMOLYZED. HEMOLYSIS MAY AFFECT INTEGRITY OF RESULTS. 2.24*  --  2.19*  CALCIUM SPECIMEN HEMOLYZED. HEMOLYSIS MAY AFFECT INTEGRITY OF RESULTS. 8.3*  --  7.9*  MG  --   --  2.2  --    GFR: Estimated Creatinine Clearance: 26.6 mL/min (A) (by C-G formula based on SCr of 2.19 mg/dL (H)). Liver Function Tests: Recent Labs  Lab 11/22/19 1446 11/22/19 1600 11/22/19 2310  AST SPECIMEN HEMOLYZED. HEMOLYSIS MAY AFFECT INTEGRITY OF RESULTS. 31 30  ALT SPECIMEN HEMOLYZED. HEMOLYSIS MAY AFFECT INTEGRITY OF RESULTS. 17 18  ALKPHOS SPECIMEN HEMOLYZED. HEMOLYSIS MAY AFFECT INTEGRITY OF RESULTS. 53 80  BILITOT SPECIMEN HEMOLYZED. HEMOLYSIS MAY AFFECT INTEGRITY OF RESULTS. 0.6 0.8  PROT SPECIMEN HEMOLYZED. HEMOLYSIS MAY AFFECT INTEGRITY OF RESULTS. 6.2* 5.5*  ALBUMIN SPECIMEN HEMOLYZED. HEMOLYSIS MAY AFFECT INTEGRITY OF RESULTS. 2.7* 2.4*   No results for input(s): LIPASE, AMYLASE in the last 168 hours. No results for input(s): AMMONIA in the last 168 hours. Coagulation Profile: No results for input(s): INR, PROTIME in the last 168 hours. Cardiac Enzymes: No results for  input(s): CKTOTAL, CKMB, CKMBINDEX, TROPONINI in the last 168 hours. BNP (last 3 results) No results for input(s): PROBNP in the last 8760 hours. HbA1C: No results for input(s): HGBA1C in the last 72 hours. CBG: Recent Labs  Lab 11/23/19 0308 11/23/19 0544 11/23/19 0753 11/23/19 1025 11/23/19 1148  GLUCAP 483* 339* 205* 299* 404*   Lipid Profile: No results for input(s): CHOL, HDL, LDLCALC, TRIG, CHOLHDL, LDLDIRECT in the last 72 hours. Thyroid Function Tests: Recent Labs    11/23/19 1031  TSH 3.203   Anemia Panel: Recent Labs    11/23/19 1031  VITAMINB12 231   Sepsis Labs: No results for input(s): PROCALCITON, LATICACIDVEN in the last 168 hours.  Recent Results (from the past 240 hour(s))  Urine culture     Status: None   Collection Time: 11/22/19  4:00 PM   Specimen: Urine, Random  Result Value Ref Range Status   Specimen Description URINE, RANDOM  Final   Special Requests NONE  Final   Culture   Final    NO GROWTH Performed at [facility], [address] [place], [place] [address]    Report Status 11/23/2019 FINAL  Final  SARS CORONAVIRUS 2 (TAT 6-24 HRS) Nasopharyngeal Nasopharyngeal Swab     Status: None   Collection Time: 11/22/19  4:08  PM   Specimen: Nasopharyngeal Swab  Result Value Ref Range Status   SARS Coronavirus 2 NEGATIVE NEGATIVE Final    Comment: (NOTE) SARS-CoV-2 target nucleic acids are NOT DETECTED. The SARS-CoV-2 RNA is generally detectable in upper and lower respiratory specimens during the acute phase of infection. Negative results do not preclude SARS-CoV-2 infection, do not rule out co-infections with other pathogens, and should not be used as the sole basis for treatment or other patient management decisions. Negative results must be combined with clinical observations, patient history, and epidemiological information. The expected result is Negative. Fact Sheet for Patients: SugarRoll.be Fact  Sheet for Healthcare Providers: https://www.woods-mathews.com/ This test is not yet approved or cleared by the [place] FDA and  has been authorized for detection and/or diagnosis of SARS-CoV-2 by FDA under an Emergency Use Authorization (EUA). This EUA will remain  in effect (meaning this test can be used) for the duration of the COVID-19 declaration under Section 56 4(b)(1) of the Act, 21 U.S.C. section 360bbb-3(b)(1), unless the authorization is terminated or revoked sooner. Performed at [facility], [place] [address], [place], [place] [address]          Radiology Studies: CT Head Wo Contrast  Result Date: 11/22/2019 CLINICAL DATA:  Altered mental status. EXAM: CT HEAD WITHOUT CONTRAST TECHNIQUE: Contiguous axial images were obtained from the base of the skull through the vertex without intravenous contrast. COMPARISON:  January 27, 2019. FINDINGS: Brain: Mild diffuse cortical atrophy is noted. Mild chronic ischemic white matter disease is noted. No mass effect or midline shift is noted. Ventricular size is within normal limits. There is no evidence of mass lesion, hemorrhage or acute infarction. Vascular: No hyperdense vessel or unexpected calcification. Skull: Normal. Negative for fracture or focal lesion. Sinuses/Orbits: No acute finding. Other: None. IMPRESSION: Mild diffuse cortical atrophy. Mild chronic ischemic white matter disease. No acute intracranial abnormality seen. Electronically Signed   By: Marijo Conception M.D.   On: 11/22/2019 16:34   DG Chest Portable 1 View  Result Date: 11/22/2019 CLINICAL DATA:  Altered level of consciousness, leukocytosis EXAM: PORTABLE CHEST 1 VIEW COMPARISON:  05/17/2019 FINDINGS: The heart size and mediastinal contours are within normal limits. Both lungs are clear. The visualized skeletal structures are unremarkable. IMPRESSION: No active disease. Electronically Signed   By: Randa Ngo M.D.   On: 11/22/2019 15:13         Scheduled Meds: . amLODipine  10 mg Oral Daily  . aspirin  81 mg Oral Daily  . atorvastatin  10 mg Oral Daily  . famotidine  20 mg Oral Daily  . FLUoxetine  40 mg Oral Daily  . heparin  5,000 Units Subcutaneous Q8H  . [START ON 11/24/2019] influenza vaccine adjuvanted  0.5 mL Intramuscular Tomorrow-1000  . insulin aspart  0-9 Units Subcutaneous TID WC  . insulin detemir  5 Units Subcutaneous Daily  . [START ON 11/28/2019] Vitamin D (Ergocalciferol)  50,000 Units Oral Weekly   Continuous Infusions:   LOS: 1 day   Time spent= 35 mins    Jaran Sainz Arsenio Loader, MD [facility]  If 7PM-7AM, please contact night-coverage  11/23/2019, 1:03 PM

## 2019-11-24 LAB — HEMOGLOBIN A1C
Hgb A1c MFr Bld: 10.7 % — ABNORMAL HIGH (ref 4.8–5.6)
Mean Plasma Glucose: 260 mg/dL

## 2019-11-24 LAB — GLUCOSE, CAPILLARY
Glucose-Capillary: 93 mg/dL (ref 70–99)
Glucose-Capillary: 311 mg/dL — ABNORMAL HIGH (ref 70–99)
Glucose-Capillary: 224 mg/dL — ABNORMAL HIGH (ref 70–99)

## 2019-11-24 NOTE — Progress Notes (Signed)
Inpatient Diabetes Program Recommendations  AACE/ADA: New Consensus Statement on Inpatient Glycemic Control (2015)  Target Ranges:  Prepandial:   less than 140 mg/dL      Peak postprandial:   less than 180 mg/dL (1-2 hours)      Critically ill patients:  140 - 180 mg/dL   Lab Results  Component Value Date   GLUCAP 93 11/24/2019   HGBA1C 10.7 (H) 11/23/2019    Review of Glycemic Control Results for [name] [name], [name] [name] (MRN [id number]) as of 11/24/2019 11:15  Ref. Range 11/23/2019 14:45 11/23/2019 16:48 11/23/2019 20:22 11/23/2019 23:04 11/24/2019 06:58  Glucose-Capillary Latest Ref Range: 70 - 99 mg/dL 215 (H) 141 (H) 211 (H) 220 (H) 93   Diabetes history: DM 2- however very sensitive to insulin Outpatient Diabetes medications:  Lantus 8-12 units in the AM (per sister based on A1C), 70/30 4-5 units with supper Glipizide 10 mg bid Current orders for Inpatient glycemic control:  Levemir 10 units daily, Novolog sensitive tid with meals  Inpatient Diabetes Program Recommendations:    Spoke at length to patient's sister by phone. Patient is NOT self administering insulin. [name] goes in the morning and gives Lantus (8-12 units) based on patients blood sugar and [name] goes in the evening and give 70/30 4-5 units in the evening (they also make sure that he eats). One change that was made was that MD told them to give Glucotrol- Since starting Glucotrol blood sugars have been lower and sisters had not administered any insulin since Thursday February 4th.    Recommendations:  -D/c Glipizide at discharge -Continue Lantus 10 units q AM -Continue 70/30 5 units with supper -Hold insulin if CBG < 100 mg/dL -F/U with PCP  Thanks  Adah Perl, RN, BC-ADM Inpatient Diabetes Coordinator Pager [phone] (8a-5p)

## 2019-11-24 NOTE — Evaluation (Signed)
Occupational Therapy Evaluation Patient Details Name: [name] [name] MRN: [id number] DOB: 08-24-1946 Today's Date: 11/24/2019    History of Present Illness 74 year old with history of HTN, DM2-insulin-dependent, CVA 2016, CKD stage IIIb, chronic anemia, cognitive impairment secondary to CVA brought to the hospital for change in mental status.  He was found to be hypoglycemic which improved after D50.  Admitted to the hospital for closer monitoring and concerns of dehydration.  In the meantime allowing medication to washout.   Clinical Impression   Pt lives alone with intermittent assistance of his sister for medication administration and transportation. Pt has baseline impaired cognition. He furniture walks at home or pushes the grocery cart if he goes shopping. Pt reports he is functioning at his baseline. Educated in fall prevention and benefits of DME, but pt refusing.    Follow Up Recommendations  No OT follow up    Equipment Recommendations  None recommended by OT    Recommendations for Other Services       Precautions / Restrictions Precautions Precautions: Fall Restrictions Weight Bearing Restrictions: No      Mobility Bed Mobility Overal bed mobility: Modified Independent             General bed mobility comments: HOB up  Transfers Overall transfer level: Independent Equipment used: None                  Balance Overall balance assessment: Needs assistance Sitting-balance support: No upper extremity supported;Feet supported Sitting balance-Leahy Scale: Good       Standing balance-Leahy Scale: Fair Standing balance comment: statically, reaches for sink, door with ambulation                           ADL either performed or assessed with clinical judgement   ADL Overall ADL's : At baseline                                       General ADL Comments: supervised for safety, but pt likely at his baseline     Vision Patient  Visual Report: No change from baseline       Perception     Praxis      Pertinent Vitals/Pain Pain Assessment: No/denies pain     Hand Dominance Right   Extremity/Trunk Assessment Upper Extremity Assessment Upper Extremity Assessment: Overall WFL for tasks assessed(L thumb joint deformity)   Lower Extremity Assessment Lower Extremity Assessment: Defer to PT evaluation   Cervical / Trunk Assessment Cervical / Trunk Assessment: Kyphotic   Communication Communication Communication: Expressive difficulties   Cognition Arousal/Alertness: Awake/alert Behavior During Therapy: WFL for tasks assessed/performed Overall Cognitive Status: History of cognitive impairments - at baseline                                 General Comments: decreased safety awareness, fall risk, but refusing DME   General Comments       Exercises     Shoulder Instructions      Home Living Family/patient expects to be discharged to:: Private residence Living Arrangements: Alone Available Help at Discharge: Family;Available PRN/intermittently Type of Home: Apartment Home Access: Elevator     Home Layout: One level     Bathroom Shower/Tub: Occupational psychologist: Standard     Home Equipment: None  Prior Functioning/Environment Level of Independence: Independent        Comments: sister gets groceries and meds        OT Problem List:        OT Treatment/Interventions:      OT Goals(Current goals can be found in the care plan section) Acute Rehab OT Goals Patient Stated Goal: to go home  OT Frequency:     Barriers to D/C:            Co-evaluation              AM-PAC OT "6 Clicks" Daily Activity     Outcome Measure Help from another person eating meals?: None Help from another person taking care of personal grooming?: A Little Help from another person toileting, which includes using toliet, bedpan, or urinal?: A Little Help from  another person bathing (including washing, rinsing, drying)?: None Help from another person to put on and taking off regular upper body clothing?: None Help from another person to put on and taking off regular lower body clothing?: None 6 Click Score: 22   End of Session    Activity Tolerance: Patient tolerated treatment well Patient left: in bed;with call bell/phone within reach;with bed alarm set  OT Visit Diagnosis: Other abnormalities of gait and mobility (R26.89)                Time: [id number] OT Time Calculation (min): 18 min Charges:  OT General Charges $OT Visit: 1 Visit OT Evaluation $OT Eval Moderate Complexity: 1 Mod  Nestor Lewandowsky, OTR/L Acute Rehabilitation Services Pager: [phone] Office: [phone]  [name] 11/24/2019, 1:32 PM

## 2019-11-24 NOTE — Plan of Care (Signed)
Working torwards all goals.

## 2019-11-24 NOTE — Progress Notes (Signed)
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  He was found to be hypoglycemic which improved after D50.  Admitted to the hospital for closer monitoring and concerns of dehydration.  In the meantime allowing medication to washout.   Assessment & Plan:   Principal Problem:   Acute metabolic encephalopathy Active Problems:   Vitamin B 12 deficiency   Hypoglycemia due to insulin   Normocytic anemia   Uncontrolled type 2 diabetes mellitus with hyperglycemia, with long-term current use of insulin ([name])   CVA (cerebral vascular accident) ([place])   Cognitive decline   Metabolic encephalopathy  Acute metabolic encephalopathy; improved. Uncontrolled diabetes mellitus type 2, insulin-dependent. Labile.  -Hypoglycemia is improved so his mentation, blood glucose still slightly labile.  CT of the head shows chronic infarct otherwise no acute pathology. -Lantus 8-12 U in am, 70/30 4-5 U Qhs. Diabetic coordinator work appreciated.  -Hemoglobin A1c 11.5  AKI on CKD stage III. -Baseline creatinine 1.8.  Slowly improving with fluids.  History of CVA -Aspirin and statin  Essential hypertension -As needed hydralazine  History of vascular dementia/depression History of CVA -CT of the head mild atrophy of the brain.  Supportive care.  Chronic normocytic anemia -Hemoglobin stable no active signs of bleeding  PT/OT  Family unable to receive him today and unable to support him at home until starting tomorrow.  They are the ones of administration his insulin after checking his blood glucose.  Patient requires very close monitoring of his blood glucose therefore we will continue to monitor him here.  DVT prophylaxis: Subcutaneous  heparin Code Status: Full code Family Communication:  [name] updated.  Disposition Plan:   Patient From= Home  Patient Anticipated D/C place= Home  Barriers= labile blood glucose, lack of family support at home who administering his insulin daily.  He still requires nursing supervision and correct administration of dosages.  We will discharge him tomorrow.  Subjective: Feels a little better this morning.  P.o. intake is still inconsistent.  Review of Systems Otherwise negative except as per HPI, including: General = no fevers, chills, dizziness,  fatigue HEENT/EYES = negative for loss of vision, double vision, blurred vision,  sore throa Cardiovascular= negative for chest pain, palpitation Respiratory/lungs= negative for shortness of breath, cough, wheezing; hemoptysis,  Gastrointestinal= negative for nausea, vomiting, abdominal pain Genitourinary= negative for Dysuria MSK = Negative for arthralgia, myalgias Neurology= Negative for headache, numbness, tingling  Psychiatry= Negative for suicidal and homocidal ideation Skin= Negative for Rash  Examination:  Constitutional: Not in acute distress Respiratory: Clear to auscultation bilaterally Cardiovascular: Normal sinus rhythm, no rubs Abdomen: Nontender nondistended good bowel sounds Musculoskeletal: No edema noted Skin: No rashes seen Neurologic: CN 2-12 grossly intact.  And nonfocal Psychiatric: Normal judgment and insight. Alert and oriented x 3. Normal mood.     Objective: Vitals:   11/23/19 0756 11/23/19 1647 11/23/19 2303 11/24/19 0826  BP: (!) 146/73 (!) 147/73 (!) 165/80 (!) 151/86  Pulse: 76 69 71 68  Resp: 16 16  17   Temp: 99.7 F (37.6 C) 98.4 F (36.9 C) 98.9 F (37.2 C) 98.8 F (37.1 C)  TempSrc:    Oral  SpO2: 97% 95% 100% 98%  Weight:      Height:  Intake/Output Summary (Last 24 hours) at 11/24/2019 1157 Last data filed at 11/24/2019 1026 Gross per 24 hour  Intake 1200 ml  Output 1400 ml  Net  -200 ml   Filed Weights   11/22/19 1109  Weight: 63.5 kg     Data Reviewed:   CBC: Recent Labs  Lab 11/22/19 1250 11/22/19 2310  WBC 12.6* 10.5  HGB 11.6* 11.1*  HCT 35.7* 34.4*  MCV 89.9 91.0  PLT 168 [id number]   Basic Metabolic Panel: Recent Labs  Lab 11/22/19 1446 11/22/19 1600 11/22/19 1818 11/22/19 2310  NA SPECIMEN HEMOLYZED. HEMOLYSIS MAY AFFECT INTEGRITY OF RESULTS. 143  --  [place] HEMOLYSIS MAY AFFECT INTEGRITY OF RESULTS. 2.7*  --  3.8  CL SPECIMEN HEMOLYZED. HEMOLYSIS MAY AFFECT INTEGRITY OF RESULTS. 111  --  110  CO2 SPECIMEN HEMOLYZED. HEMOLYSIS MAY AFFECT INTEGRITY OF RESULTS. 23  --  18*  GLUCOSE SPECIMEN HEMOLYZED. HEMOLYSIS MAY AFFECT INTEGRITY OF RESULTS. 36*  --  170*  BUN SPECIMEN HEMOLYZED. HEMOLYSIS MAY AFFECT INTEGRITY OF RESULTS. 27*  --  26*  CREATININE SPECIMEN HEMOLYZED. HEMOLYSIS MAY AFFECT INTEGRITY OF RESULTS. 2.24*  --  2.19*  CALCIUM SPECIMEN HEMOLYZED. HEMOLYSIS MAY AFFECT INTEGRITY OF RESULTS. 8.3*  --  7.9*  MG  --   --  2.2  --    GFR: Estimated Creatinine Clearance: 26.6 mL/min (A) (by C-G formula based on SCr of 2.19 mg/dL (H)). Liver Function Tests: Recent Labs  Lab 11/22/19 1446 11/22/19 1600 11/22/19 2310  AST SPECIMEN HEMOLYZED. HEMOLYSIS MAY AFFECT INTEGRITY OF RESULTS. 31 30  ALT SPECIMEN HEMOLYZED. HEMOLYSIS MAY AFFECT INTEGRITY OF RESULTS. 17 18  ALKPHOS SPECIMEN HEMOLYZED. HEMOLYSIS MAY AFFECT INTEGRITY OF RESULTS. 53 35  BILITOT SPECIMEN HEMOLYZED. HEMOLYSIS MAY AFFECT INTEGRITY OF RESULTS. 0.6 0.8  PROT SPECIMEN HEMOLYZED. HEMOLYSIS MAY AFFECT INTEGRITY OF RESULTS. 6.2* 5.5*  ALBUMIN SPECIMEN HEMOLYZED. HEMOLYSIS MAY AFFECT INTEGRITY OF RESULTS. 2.7* 2.4*   No results for input(s): LIPASE, AMYLASE in the last 168 hours. No results for input(s): AMMONIA in the last 168 hours. Coagulation Profile: No results for input(s): INR, PROTIME in the last 168 hours. Cardiac Enzymes: No results for input(s):  CKTOTAL, CKMB, CKMBINDEX, TROPONINI in the last 168 hours. BNP (last 3 results) No results for input(s): PROBNP in the last 8760 hours. HbA1C: Recent Labs    11/23/19 1031  HGBA1C 10.7*   CBG: Recent Labs  Lab 11/23/19 1445 11/23/19 1648 11/23/19 2022 11/23/19 2304 11/24/19 0658  GLUCAP 215* 141* 211* 220* 93   Lipid Profile: No results for input(s): CHOL, HDL, LDLCALC, TRIG, CHOLHDL, LDLDIRECT in the last 72 hours. Thyroid Function Tests: Recent Labs    11/23/19 1031  TSH 3.203   Anemia Panel: Recent Labs    11/23/19 1031  VITAMINB12 231   Sepsis Labs: No results for input(s): PROCALCITON, LATICACIDVEN in the last 168 hours.  Recent Results (from the past 240 hour(s))  Urine culture     Status: None   Collection Time: 11/22/19  4:00 PM   Specimen: Urine, Random  Result Value Ref Range Status   Specimen Description URINE, RANDOM  Final   Special Requests NONE  Final   Culture   Final    NO GROWTH Performed at [facility], [address] [place], [place] [address]    Report Status 11/23/2019 FINAL  Final  SARS CORONAVIRUS 2 (TAT 6-24 HRS) Nasopharyngeal Nasopharyngeal Swab     Status: None   Collection Time: 11/22/19  4:08 PM   Specimen: Nasopharyngeal Swab  Result Value Ref Range Status   SARS Coronavirus 2 NEGATIVE NEGATIVE Final    Comment: (NOTE) SARS-CoV-2 target nucleic acids are NOT DETECTED. The SARS-CoV-2 RNA is generally detectable in upper and lower respiratory specimens during the acute phase of infection. Negative results do not preclude SARS-CoV-2 infection, do not rule out co-infections with other pathogens, and should not be used as the sole basis for treatment or other patient management decisions. Negative results must be combined with clinical observations, patient history, and epidemiological information. The expected result is Negative. Fact Sheet for Patients: SugarRoll.be Fact Sheet for  Healthcare Providers: https://www.woods-mathews.com/ This test is not yet approved or cleared by the [place] FDA and  has been authorized for detection and/or diagnosis of SARS-CoV-2 by FDA under an Emergency Use Authorization (EUA). This EUA will remain  in effect (meaning this test can be used) for the duration of the COVID-19 declaration under Section 56 4(b)(1) of the Act, 21 U.S.C. section 360bbb-3(b)(1), unless the authorization is terminated or revoked sooner. Performed at [facility], [place] [address], [place], [name] [address]          Radiology Studies: CT Head Wo Contrast  Result Date: 11/22/2019 CLINICAL DATA:  Altered mental status. EXAM: CT HEAD WITHOUT CONTRAST TECHNIQUE: Contiguous axial images were obtained from the base of the skull through the vertex without intravenous contrast. COMPARISON:  January 27, 2019. FINDINGS: Brain: Mild diffuse cortical atrophy is noted. Mild chronic ischemic white matter disease is noted. No mass effect or midline shift is noted. Ventricular size is within normal limits. There is no evidence of mass lesion, hemorrhage or acute infarction. Vascular: No hyperdense vessel or unexpected calcification. Skull: Normal. Negative for fracture or focal lesion. Sinuses/Orbits: No acute finding. Other: None. IMPRESSION: Mild diffuse cortical atrophy. Mild chronic ischemic white matter disease. No acute intracranial abnormality seen. Electronically Signed   By: Marijo Conception M.D.   On: 11/22/2019 16:34   DG Chest Portable 1 View  Result Date: 11/22/2019 CLINICAL DATA:  Altered level of consciousness, leukocytosis EXAM: PORTABLE CHEST 1 VIEW COMPARISON:  05/17/2019 FINDINGS: The heart size and mediastinal contours are within normal limits. Both lungs are clear. The visualized skeletal structures are unremarkable. IMPRESSION: No active disease. Electronically Signed   By: Randa Ngo M.D.   On: 11/22/2019 15:13        Scheduled  Meds: . amLODipine  10 mg Oral Daily  . aspirin  81 mg Oral Daily  . atorvastatin  10 mg Oral Daily  . famotidine  20 mg Oral Daily  . feeding supplement (GLUCERNA SHAKE)  237 mL Oral BID BM  . FLUoxetine  40 mg Oral Daily  . heparin  5,000 Units Subcutaneous Q8H  . insulin aspart  0-9 Units Subcutaneous TID WC  . insulin detemir  10 Units Subcutaneous Daily  . [START ON 11/28/2019] Vitamin D (Ergocalciferol)  50,000 Units Oral Weekly   Continuous Infusions:   LOS: 2 days   Time spent= 35 mins    Lillyana Majette Arsenio Loader, MD [facility]  If 7PM-7AM, please contact night-coverage  11/24/2019, 11:57 AM

## 2019-11-25 LAB — GLUCOSE, CAPILLARY
Glucose-Capillary: 558 mg/dL (ref 70–99)
Glucose-Capillary: 548 mg/dL (ref 70–99)
Glucose-Capillary: 320 mg/dL — ABNORMAL HIGH (ref 70–99)
Glucose-Capillary: 199 mg/dL — ABNORMAL HIGH (ref 70–99)

## 2019-11-25 MED ORDER — INSULIN LISPRO 100 UNIT/ML ~~LOC~~ SOLN: SUBCUTANEOUS | 3 refills | Status: DC

## 2019-11-25 MED ORDER — INSULIN ASPART 100 UNIT/ML ~~LOC~~ SOLN
15.0000 [IU] | Freq: Once | SUBCUTANEOUS | Status: AC
  Administered 2019-11-25: 15 [IU] via SUBCUTANEOUS

## 2019-11-25 MED ORDER — INSULIN ASPART 100 UNIT/ML ~~LOC~~ SOLN
3.0000 [IU] | Freq: Three times a day (TID) | SUBCUTANEOUS | Status: DC
  Administered 2019-11-25 (×2): 3 [IU] via SUBCUTANEOUS

## 2019-11-25 MED ORDER — INSULIN ASPART 100 UNIT/ML ~~LOC~~ SOLN
0.0000 [IU] | Freq: Three times a day (TID) | SUBCUTANEOUS | Status: DC
  Administered 2019-11-25: 3 [IU] via SUBCUTANEOUS

## 2019-11-25 MED ORDER — NOVOLOG MIX 70/30 (70-30) 100 UNIT/ML ~~LOC~~ SUSP: 4.0000 [IU] | Freq: Every day | SUBCUTANEOUS | 11 refills | Status: DC

## 2019-11-25 MED ORDER — LANTUS SOLOSTAR 100 UNIT/ML ~~LOC~~ SOPN: 10.0000 [IU] | PEN_INJECTOR | Freq: Every day | SUBCUTANEOUS | Status: DC

## 2019-11-25 MED ORDER — ASPIRIN 81 MG PO CHEW: 81.0000 mg | CHEWABLE_TABLET | Freq: Every day | ORAL | 0 refills | Status: AC

## 2019-11-25 MED ORDER — INSULIN ASPART 100 UNIT/ML ~~LOC~~ SOLN: 0.0000 [IU] | Freq: Every day | SUBCUTANEOUS | Status: DC

## 2019-11-25 NOTE — Care Management Important Message (Signed)
Important Message  Patient Details  Name: [name] [name] MRN: [id number] Date of Birth: 1946/02/03   Medicare Important Message Given:  Yes     Glendora Clouatre 11/25/2019, 1:16 PM

## 2019-11-25 NOTE — Progress Notes (Signed)
Inpatient Diabetes Program Recommendations  AACE/ADA: New Consensus Statement on Inpatient Glycemic Control (2015)  Target Ranges:  Prepandial:   less than 140 mg/dL      Peak postprandial:   less than 180 mg/dL (1-2 hours)      Critically ill patients:  140 - 180 mg/dL   Lab Results  Component Value Date   GLUCAP 320 (H) 11/25/2019   HGBA1C 10.7 (H) 11/23/2019    Review of Glycemic Control Results for KRIST, BUCHOLZ (MRN [id number]) as of 11/25/2019 10:54  Ref. Range 11/25/2019 07:27 11/25/2019 08:53 11/25/2019 10:46  Glucose-Capillary Latest Ref Range: 70 - 99 mg/dL 558 (HH) 548 (HH) 320 (H)  Diabetes history: DM 2- however very sensitive to insulin Outpatient Diabetes medications:  Lantus 8-12 units in the AM (per sister based on A1C), 70/30 4-5 units with supper Glipizide 10 mg bid Current orders for Inpatient glycemic control:  Levemir 10 units daily, Novolog sensitive tid with meals  Inpatient Diabetes Program Recommendations:    Spoke with both sisters by phone.  We reviewed plans for discharge including stopping Glipzide and Metformin.  Also [name] will still administer Lantus in the mornings with breakfast and [name] will administer evening 70/30 (per doses on AVS) with supper.  MD will also add Humalog correction scale based on what blood sugar checks are with each visit.  They verbalized understanding and state that he needs Rx. Called in for 70/30 insulin pens and Humalog insulin pens (for Correction insulin) to [facility]. Sent message to MD as well. Note patient also has home health orders.   Thanks  Adah Perl, RN, BC-ADM Inpatient Diabetes Coordinator Pager [phone] (8a-5p)

## 2019-11-25 NOTE — Progress Notes (Signed)
Patient Blood sugar is 558 this am MD notified,awaiting instruction. Patient is alert and oriented at bedside.

## 2019-11-25 NOTE — TOC Transition Note (Signed)
Transition of Care [facility]) - CM/SW Discharge Note   Patient Details  Name: [name] [name] MRN: [id number] Date of Birth: August 06, 1946  [facility] CM/SW Contact:  Pollie Friar, RN Phone Number: 11/25/2019, 10:40 AM   Clinical Narrative:    Pt discharging home with [facility] services through [facility]. Cassie with [facility] aware of d/c.  Per sister pt has walker at home. (pt refused walker and says wont use) Sisters to provide transport home.    Final next level of care: Home w Home Health Services Barriers to Discharge: No Barriers Identified   Patient Goals and CMS Choice   CMS Medicare.gov Compare Post Acute Care list provided to:: Patient Choice offered to / list presented to : Patient, Sibling  Discharge Placement                       Discharge Plan and Services   Discharge Planning Services: CM Consult Post Acute Care Choice: Home Health                    HH Arranged: PT, OT [facility] Agency: [facility] Date [place]: 11/25/19   Representative spoke with at [place]: Cassie  Social Determinants of Health ([place]) Interventions     Readmission Risk Interventions No flowsheet data found.

## 2019-11-25 NOTE — Discharge Summary (Signed)
Physician Discharge Summary  [place] DVV:[id number] DOB: [birth year] DOA: 11/22/2019  PCP: Azzie Glatter, [name]  Admit date: 11/22/2019 Discharge date: 11/25/2019  Admitted From: Home Disposition:  Home  Recommendations for Outpatient Follow-up:  1. Follow up with PCP in 1-2 weeks.  Message sent to his outpatient PCP regarding changes 2. Please obtain BMP/CBC in one week your next doctors visit.  3. Would benefit from outpatient referral to endocrinology 4. Discontinue glipizide and Metformin. 5. Please exactly give the insulin regimen that is currently prescribed as listed below. 6. Diabetic coordinator has provided extensive education to the patient and the family. 7. Should outpatient consider to place him on continuous glucose monitoring if really necessary such as libre.   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  He was found to be hypoglycemic which improved after D50.  Admitted to the hospital for closer monitoring and concerns of dehydration.  In the meantime allowing medication to washout.  During the hospitalization his Metformin and glipizide were held.  Insulin including Lantus, sliding scales were adjusted.  Extensive education to the family was provided.  His sugars were labile but fairly better controlled whenever medications were given. Although I find 70/30 added to this regimen is abnormal but it has worked for the family and would like to continue to this.  Should get outpatient endocrinology referral.  Acute metabolic encephalopathy; improved. Uncontrolled diabetes mellitus type 2, insulin-dependent. Labile.  -Mentation has improved.  Blood glucose is labile.  After extensive discussion between diabetic coordinator,  family and myself-family has decided to keep him on Lantus, sliding scale as prescribed along with 70/30 in the evening. Somewhat unusual regimen but has worked for the family.  She will obtain outpatient endocrinology evaluation. -Appreciate input from diabetic coordinator -Discontinue Metformin and glipizide  AKI on CKD stage III. -Baseline creatinine 1.8.  Slowly improving with fluids.  History of CVA -Aspirin and statin  Essential hypertension -As needed hydralazine  History of vascular dementia/depression History of CVA -CT of the head mild atrophy of the brain.  Supportive care.  Chronic normocytic anemia -Hemoglobin stable no active signs of bleeding   Discharge Diagnoses:  Principal Problem:   Acute metabolic encephalopathy Active Problems:   Vitamin B 12 deficiency   Hypoglycemia due to insulin   Normocytic anemia   Uncontrolled type 2 diabetes mellitus with hyperglycemia, with long-term current use of insulin ([name])   CVA (cerebral vascular accident) ([place])   Cognitive decline   Metabolic encephalopathy    Consultations:  Diabetic foot  Subjective: Patient is awake alert, does not any complaints of.  Overall feels okay.  Blood glucose elevated this morning but resolved with NovoLog.  Discharge Exam: Vitals:   11/25/19 0853 11/25/19 1049  BP: (!) 175/93 121/76  Pulse: 97 82  Resp:    Temp:    SpO2:     Vitals:   11/25/19 0558 11/25/19 0750 11/25/19 0853 11/25/19 1049  BP: (!) 155/86 (!) 160/78 (!) 175/93 121/76  Pulse:  95 97 82  Resp:  16    Temp:  99 F (37.2 C)    TempSrc:  Oral    SpO2:  97%    Weight:      Height:        General: Pt is alert, awake, not in acute distress  Cardiovascular: RRR, S1/S2 +, no rubs, no gallops Respiratory: CTA bilaterally, no wheezing, no rhonchi Abdominal: Soft, NT, ND, bowel sounds + Extremities: no edema, no cyanosis.  Chronic joint deformity.  Discharge Instructions   Allergies as of 11/25/2019    No Known Allergies     Medication List    STOP taking these medications   glipiZIDE 10 MG tablet Commonly known as: GLUCOTROL   metFORMIN 500 MG tablet Commonly known as: GLUCOPHAGE     TAKE these medications   Accu-Chek Guide w/Device Kit 1 Device by Does not apply route QID.   amLODipine 10 MG tablet Commonly known as: NORVASC TAKE 1 TABLET ONCE DAILY.   aspirin 81 MG chewable tablet Chew 1 tablet (81 mg total) by mouth daily.   atorvastatin 10 MG tablet Commonly known as: LIPITOR Take 1 tablet (10 mg total) by mouth daily.   B-D ULTRAFINE III SHORT PEN 31G X 8 MM Misc Generic drug: Insulin Pen Needle USE WITH INJECTIONS   FLUoxetine 40 MG capsule Commonly known as: PROZAC Take 1 capsule (40 mg total) by mouth daily.   glucose blood test strip Commonly known as: True Metrix Blood Glucose Test CHECK BLOOD SUGAR UP TO 4 TIMES A DAY.   glucose blood test strip Commonly known as: Accu-Chek Guide Use as instructed   Accu-Chek Guide test strip Generic drug: glucose blood USE AS DIRECTED   insulin lispro 100 UNIT/ML injection Commonly known as: HUMALOG Blood Sugar  Low Dose   60-150  No Insulin 151-200  1 units  201-250  3 units 251-300  5 units  301-350  6 units  >350              6 units Call MD  For blood sugar < 60 mg/dL . Call your Doctor -Drink Salli Real What changed: additional instructions   Insulin Syringe-Needle U-100 31G X 5/16" 0.3 ML Misc Commonly known as: BD Insulin Syringe U/F Inject 1 each as directed 2 (two) times daily. as directed   Lantus SoloStar 100 UNIT/ML Solostar Pen Generic drug: Insulin Glargine Inject 10-12 Units into the skin daily. What changed: how much to take   NovoLOG Mix 70/30 (70-30) 100 UNIT/ML injection Generic drug: insulin aspart protamine- aspart Inject 0.04-0.06 mLs (4-6 Units total) into the skin at bedtime. What changed: how much to take   Vitamin D (Ergocalciferol) 1.25 MG (50000 UNIT) Caps  capsule Commonly known as: DRISDOL Take 1 capsule (50,000 Units total) by mouth once a week.       No Known Allergies  You were cared for by a hospitalist during your hospital stay. If you have any questions about your discharge medications or the care you received while you were in the hospital after you are discharged, you can call the unit and asked to speak with the hospitalist on call if the hospitalist that took care of you is not available. Once you are discharged, your primary care physician will handle any further medical issues. Please note that no refills for any discharge medications will be authorized once you are discharged, as it is imperative that you return to your primary care physician (or establish a relationship with a primary care physician if you do not have one) for your aftercare needs so that they can reassess your need for medications and monitor your lab values.   Procedures/Studies: CT Head Wo Contrast  Result Date: 11/22/2019 CLINICAL DATA:  Altered mental status. EXAM: CT HEAD WITHOUT CONTRAST TECHNIQUE: Contiguous axial images  were obtained from the base of the skull through the vertex without intravenous contrast. COMPARISON:  January 27, 2019. FINDINGS: Brain: Mild diffuse cortical atrophy is noted. Mild chronic ischemic white matter disease is noted. No mass effect or midline shift is noted. Ventricular size is within normal limits. There is no evidence of mass lesion, hemorrhage or acute infarction. Vascular: No hyperdense vessel or unexpected calcification. Skull: Normal. Negative for fracture or focal lesion. Sinuses/Orbits: No acute finding. Other: None. IMPRESSION: Mild diffuse cortical atrophy. Mild chronic ischemic white matter disease. No acute intracranial abnormality seen. Electronically Signed   By: Marijo Conception M.D.   On: 11/22/2019 16:34   DG Chest Portable 1 View  Result Date: 11/22/2019 CLINICAL DATA:  Altered level of consciousness, leukocytosis  EXAM: PORTABLE CHEST 1 VIEW COMPARISON:  05/17/2019 FINDINGS: The heart size and mediastinal contours are within normal limits. Both lungs are clear. The visualized skeletal structures are unremarkable. IMPRESSION: No active disease. Electronically Signed   By: Randa Ngo M.D.   On: 11/22/2019 15:13      The results of significant diagnostics from this hospitalization (including imaging, microbiology, ancillary and laboratory) are listed below for reference.     Microbiology: Recent Results (from the past 240 hour(s))  Urine culture     Status: None   Collection Time: 11/22/19  4:00 PM   Specimen: Urine, Random  Result Value Ref Range Status   Specimen Description URINE, RANDOM  Final   Special Requests NONE  Final   Culture   Final    NO GROWTH Performed at [facility], [address] [place], [place] [address]    Report Status 11/23/2019 FINAL  Final  SARS CORONAVIRUS 2 (TAT 6-24 HRS) Nasopharyngeal Nasopharyngeal Swab     Status: None   Collection Time: 11/22/19  4:08 PM   Specimen: Nasopharyngeal Swab  Result Value Ref Range Status   SARS Coronavirus 2 NEGATIVE NEGATIVE Final    Comment: (NOTE) SARS-CoV-2 target nucleic acids are NOT DETECTED. The SARS-CoV-2 RNA is generally detectable in upper and lower respiratory specimens during the acute phase of infection. Negative results do not preclude SARS-CoV-2 infection, do not rule out co-infections with other pathogens, and should not be used as the sole basis for treatment or other patient management decisions. Negative results must be combined with clinical observations, patient history, and epidemiological information. The expected result is Negative. Fact Sheet for Patients: SugarRoll.be Fact Sheet for Healthcare Providers: https://www.woods-mathews.com/ This test is not yet approved or cleared by the [place] FDA and  has been authorized for detection and/or  diagnosis of SARS-CoV-2 by FDA under an Emergency Use Authorization (EUA). This EUA will remain  in effect (meaning this test can be used) for the duration of the COVID-19 declaration under Section 56 4(b)(1) of the Act, 21 U.S.C. section 360bbb-3(b)(1), unless the authorization is terminated or revoked sooner. Performed at [facility], [place] [address], [place], [place] [address]      Labs: BNP (last 3 results) No results for input(s): BNP in the last 8760 hours. Basic Metabolic Panel: Recent Labs  Lab 11/22/19 1446 11/22/19 1600 11/22/19 1818 11/22/19 2310  NA SPECIMEN HEMOLYZED. HEMOLYSIS MAY AFFECT INTEGRITY OF RESULTS. 143  --  [place] HEMOLYSIS MAY AFFECT INTEGRITY OF RESULTS. 2.7*  --  3.8  CL SPECIMEN HEMOLYZED. HEMOLYSIS MAY AFFECT INTEGRITY OF RESULTS. 111  --  110  CO2 SPECIMEN HEMOLYZED. HEMOLYSIS MAY AFFECT INTEGRITY OF RESULTS. 23  --  18*  GLUCOSE SPECIMEN HEMOLYZED. HEMOLYSIS MAY AFFECT INTEGRITY OF RESULTS. 36*  --  170*  BUN SPECIMEN HEMOLYZED. HEMOLYSIS MAY AFFECT INTEGRITY OF RESULTS. 27*  --  26*  CREATININE SPECIMEN HEMOLYZED. HEMOLYSIS MAY AFFECT INTEGRITY OF RESULTS. 2.24*  --  2.19*  CALCIUM SPECIMEN HEMOLYZED. HEMOLYSIS MAY AFFECT INTEGRITY OF RESULTS. 8.3*  --  7.9*  MG  --   --  2.2  --    Liver Function Tests: Recent Labs  Lab 11/22/19 1446 11/22/19 1600 11/22/19 2310  AST SPECIMEN HEMOLYZED. HEMOLYSIS MAY AFFECT INTEGRITY OF RESULTS. 31 30  ALT SPECIMEN HEMOLYZED. HEMOLYSIS MAY AFFECT INTEGRITY OF RESULTS. 17 18  ALKPHOS SPECIMEN HEMOLYZED. HEMOLYSIS MAY AFFECT INTEGRITY OF RESULTS. 53 83  BILITOT SPECIMEN HEMOLYZED. HEMOLYSIS MAY AFFECT INTEGRITY OF RESULTS. 0.6 0.8  PROT SPECIMEN HEMOLYZED. HEMOLYSIS MAY AFFECT INTEGRITY OF RESULTS. 6.2* 5.5*  ALBUMIN SPECIMEN HEMOLYZED. HEMOLYSIS MAY AFFECT INTEGRITY OF RESULTS. 2.7* 2.4*   No results for input(s): LIPASE, AMYLASE in the last 168 hours. No results for input(s):  AMMONIA in the last 168 hours. CBC: Recent Labs  Lab 11/22/19 1250 11/22/19 2310  WBC 12.6* 10.5  HGB 11.6* 11.1*  HCT 35.7* 34.4*  MCV 89.9 91.0  PLT 168 235   Cardiac Enzymes: No results for input(s): CKTOTAL, CKMB, CKMBINDEX, TROPONINI in the last 168 hours. BNP: Invalid input(s): POCBNP CBG: Recent Labs  Lab 11/24/19 1600 11/25/19 0727 11/25/19 0853 11/25/19 1046 11/25/19 1204  GLUCAP 224* 558* 548* 320* 199*   D-Dimer No results for input(s): DDIMER in the last 72 hours. Hgb A1c Recent Labs    11/23/19 1031  HGBA1C 10.7*   Lipid Profile No results for input(s): CHOL, HDL, LDLCALC, TRIG, CHOLHDL, LDLDIRECT in the last 72 hours. Thyroid function studies Recent Labs    11/23/19 1031  TSH 3.203   Anemia work up Recent Labs    11/23/19 1031  VITAMINB12 231   Urinalysis    Component Value Date/Time   COLORURINE STRAW (A) 11/22/2019 1600   APPEARANCEUR CLEAR 11/22/2019 1600   LABSPEC 1.011 11/22/2019 1600   PHURINE 6.0 11/22/2019 1600   GLUCOSEU 150 (A) 11/22/2019 1600   HGBUR SMALL (A) 11/22/2019 1600   BILIRUBINUR NEGATIVE 11/22/2019 1600   BILIRUBINUR Negative 11/09/2019 0910   KETONESUR NEGATIVE 11/22/2019 1600   PROTEINUR 100 (A) 11/22/2019 1600   UROBILINOGEN 0.2 11/09/2019 0910   UROBILINOGEN 0.2 05/12/2017 0939   NITRITE NEGATIVE 11/22/2019 1600   LEUKOCYTESUR NEGATIVE 11/22/2019 1600   Sepsis Labs Invalid input(s): PROCALCITONIN,  WBC,  LACTICIDVEN Microbiology Recent Results (from the past 240 hour(s))  Urine culture     Status: None   Collection Time: 11/22/19  4:00 PM   Specimen: Urine, Random  Result Value Ref Range Status   Specimen Description URINE, RANDOM  Final   Special Requests NONE  Final   Culture   Final    NO GROWTH Performed at [facility], [place] [address] [place], [place] [address]    Report Status 11/23/2019 FINAL  Final  SARS CORONAVIRUS 2 (TAT 6-24 HRS) Nasopharyngeal Nasopharyngeal Swab     Status:  None   Collection Time: 11/22/19  4:08 PM   Specimen: Nasopharyngeal Swab  Result Value Ref Range Status   SARS Coronavirus 2 NEGATIVE NEGATIVE Final    Comment: (NOTE) SARS-CoV-2 target nucleic acids are NOT DETECTED. The SARS-CoV-2 RNA is generally detectable in upper and lower respiratory specimens during the acute phase of infection. Negative results do not preclude SARS-CoV-2  infection, do not rule out co-infections with other pathogens, and should not be used as the sole basis for treatment or other patient management decisions. Negative results must be combined with clinical observations, patient history, and epidemiological information. The expected result is Negative. Fact Sheet for Patients: SugarRoll.be Fact Sheet for Healthcare Providers: https://www.woods-mathews.com/ This test is not yet approved or cleared by the [place] FDA and  has been authorized for detection and/or diagnosis of SARS-CoV-2 by FDA under an Emergency Use Authorization (EUA). This EUA will remain  in effect (meaning this test can be used) for the duration of the COVID-19 declaration under Section 56 4(b)(1) of the Act, 21 U.S.C. section 360bbb-3(b)(1), unless the authorization is terminated or revoked sooner. Performed at [facility], [place] [address] [place], [place] [address]      Time coordinating discharge:  I have spent 35 minutes face to face with the patient and on the ward discussing the patients care, assessment, plan and disposition with other care givers. >50% of the time was devoted counseling the patient about the risks and benefits of treatment/Discharge disposition and coordinating care.   SIGNED:   Damita Lack, MD  [facility] 11/25/2019, 12:14 PM   If 7PM-7AM, please contact night-coverage

## 2019-11-25 NOTE — Progress Notes (Signed)
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  He was found to be hypoglycemic which improved after D50.  Admitted to the hospital for closer monitoring and concerns of dehydration.  In the meantime allowing medication to washout.    PT Comments    Pt admitted with above diagnosis. Pt was able to ambulate in hallways with RW with min guard assist and fair safety with RW. Encouraged to use RW at home even though pt still adamantly refusing to use device.  Pt states,"it is just because  I have been laid up."  Discussed safety however pt states, "I will do what I want." Pt currently with functional limitations due to balance and endurance deficits. Pt will benefit from skilled PT to increase their independence and safety with mobility to allow discharge to the venue listed below.     Follow Up Recommendations  Home health PT;Supervision - Intermittent     Equipment Recommendations  Rolling walker with 5" wheels    Recommendations for Other Services       Precautions / Restrictions Precautions Precautions: Fall Restrictions Weight Bearing Restrictions: No    Mobility  Bed Mobility Overal bed mobility: Modified Independent             General bed mobility comments: HOB up  Transfers Overall transfer level: Independent Equipment used: None                Ambulation/Gait Ambulation/Gait assistance: Min guard;Min Web designer (Feet): 550 Feet Assistive device: Rolling walker (2 wheeled);None Gait Pattern/deviations: Step-through pattern;Decreased stride length;Trunk flexed;Wide base of support;Drifts right/left;Staggering left;Staggering right   Gait velocity interpretation:  <1.31 ft/sec, indicative of household ambulator General Gait Details: Pt unsteady without device initially therefore obtained RW and pt walked in [place] with RW with min guard assist with cues to stay close to RW as well as cues with turns.  Ptcontinues to  state he doesnt need the RW at home.  RW would assist pt with his energy conservation as well.  Reinforced to pt multiple times that he needs to use RW at home.  Pt states, " I will do what I want."  Reiterated to pt he is a fall risk.     Stairs             Wheelchair Mobility    Modified Rankin (Stroke Patients Only)       Balance Overall balance assessment: Needs assistance Sitting-balance support: No upper extremity supported;Feet supported Sitting balance-Leahy Scale: Good     Standing balance support: Bilateral upper extremity supported;During functional activity;No upper extremity supported Standing balance-Leahy Scale: Fair Standing balance comment: statically, reaches for sink, door with ambulation                            Cognition Arousal/Alertness: Awake/alert Behavior During Therapy: WFL for tasks assessed/performed Overall Cognitive Status: History of cognitive impairments - at baseline                                 General Comments: decreased safety awareness, fall risk, but refusing DME      Exercises      General  Comments        Pertinent Vitals/Pain Pain Assessment: No/denies pain    Home Living                      Prior Function            PT Goals (current goals can now be found in the care plan section) Acute Rehab PT Goals Patient Stated Goal: to go home Progress towards PT goals: Progressing toward goals    Frequency    Min 3X/week      PT Plan Current plan remains appropriate    Co-evaluation              AM-PAC PT "6 Clicks" Mobility   Outcome Measure  Help needed turning from your back to your side while in a flat bed  without using bedrails?: None Help needed moving from lying on your back to sitting on the side of a flat bed without using bedrails?: None Help needed moving to and from a bed to a chair (including a wheelchair)?: None Help needed standing up from a chair using your arms (e.g., wheelchair or bedside chair)?: A Little Help needed to walk in hospital room?: A Little Help needed climbing 3-5 steps with a railing? : A Little 6 Click Score: 21    End of Session Equipment Utilized During Treatment: Gait belt Activity Tolerance: Patient limited by fatigue Patient left: in bed;with call bell/phone within reach;with bed alarm set Nurse Communication: Mobility status PT Visit Diagnosis: Unsteadiness on feet (R26.81)     Time: [id number] PT Time Calculation (min) (ACUTE ONLY): 12 min  Charges:  $Gait Training: 8-22 mins                     Caidance Sybert W,PT Acute Rehabilitation Services Pager:  [phone]  Office:  [place] 11/25/2019, 12:57 PM

## 2019-11-25 NOTE — TOC Initial Note (Signed)
Transition of Care [facility]) - Initial/Assessment Note    Patient Details  Name: [name] [name] MRN: [id number] Date of Birth: 1946/06/20  Transition of Care [facility]) CM/SW Contact:    Pollie Friar, RN Phone Number: 11/25/2019, 8:57 AM  Clinical Narrative:                 CM met with the patient and then called and spoke to pts sister [name] with his permission. [name] says she and her sister keep patients medications at home and bring them to him when doses are due.  CM inquired about [place] services. Pt and Faye agreeable. CM provided choice and [facility] decided on. Cassie with [facility] accepted the referral.  Sisters to provide transport home when medically ready.   Expected Discharge Plan: [place] Barriers to Discharge: Continued Medical Work up   Patient Goals and CMS Choice   CMS Medicare.gov Compare Post Acute Care list provided to:: Patient Choice offered to / list presented to : Patient, Sibling  Expected Discharge Plan and Services Expected Discharge Plan: [place]   Discharge Planning Services: CM Consult Post Acute Care Choice: [place] arrangements for the past 2 months: Apartment Expected Discharge Date: 11/25/19                         HH Arranged: PT, OT HH Agency: [facility] Date [place]: 11/25/19   Representative spoke with at [place]: Cassie  Prior Living Arrangements/Services Living arrangements for the past 2 months: [place] with:: Self Patient language and need for interpreter reviewed:: Yes Do you feel safe going back to the place where you live?: Yes      Need for Family Participation in Patient Care: Yes (Comment) Care giver support system in place?: Yes (comment)(sisters can provide intermittent supervision)   Criminal Activity/Legal Involvement Pertinent to Current Situation/Hospitalization: No - Comment as needed  Activities of Daily Living Home Assistive  Devices/Equipment: CBG Meter ADL Screening (condition at time of admission) Patient's cognitive ability adequate to safely complete daily activities?: Yes Is the patient deaf or have difficulty hearing?: No Does the patient have difficulty seeing, even when wearing glasses/contacts?: No Does the patient have difficulty concentrating, remembering, or making decisions?: Yes(has dementia, patient denies issues with memory/concentration.Marland Kitchen) Patient able to express need for assistance with ADLs?: Yes Does the patient have difficulty dressing or bathing?: No Independently performs ADLs?: Yes (appropriate for developmental age) Does the patient have difficulty walking or climbing stairs?: No Weakness of Legs: None Weakness of Arms/Hands: None  Permission Sought/Granted                  Emotional Assessment Appearance:: Appears stated age Attitude/Demeanor/Rapport: Engaged Affect (typically observed): Accepting Orientation: : Oriented to Place, Oriented to Self, Oriented to Situation   Psych Involvement: No (comment)  Admission diagnosis:  Metabolic encephalopathy [I34.74] Hypoglycemia [E16.2] Patient Active Problem List   Diagnosis Date Noted  . CVA (cerebral vascular accident) ([place]) 11/22/2019  . Cognitive decline 11/22/2019  . Metabolic encephalopathy 25/95/6387  . ETOH abuse 05/17/2019  . Chronic diastolic CHF (congestive heart failure) ([place]) 05/17/2019  . DKA (diabetic ketoacidoses) ([place]) 05/17/2019  . Fall 10/03/2018  . Uncontrolled type 2 diabetes mellitus with hyperglycemia, with long-term current use of insulin ([place]) 10/03/2018  . Chest wall contusion 10/03/2018  . Underweight 11/18/2017  . Normocytic anemia 11/18/2017  . DKA, type 2 ([place]) 08/01/2017  . History  of cerebrovascular accident (CVA) in adulthood 07/25/2017  . HTN (hypertension) 07/25/2017  . Depression with anxiety 07/25/2017  . Left ventricular diastolic dysfunction 43/24/6997  . Dementia ([place]) 07/25/2017   . 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 Hypoglycemia due to insulin 06/27/2017  . Diabetes mellitus, insulin dependent (IDDM), controlled 06/27/2017  . Hypertension 06/27/2017  . Hyperkalemia 01/16/2017  . Acute metabolic encephalopathy 80/20/8910  . Weakness 11/16/2016  . Acute renal failure superimposed on stage 3 chronic kidney disease ([place])   . Noncompliance with medication regimen 08/17/2016  . History of DVT (deep vein thrombosis) 07/06/2016  . Vitamin B 12 deficiency 06/18/2016  . Hyperglycemia 05/15/2016  . Diabetic hyperosmolar non-ketotic state ([place]) 05/15/2016  . Hyponatremia 05/15/2016  . Anxiety 05/15/2016  . Narcotic dependency, continuous ([place]) 11/02/2015  . Uncontrolled type 2 diabetes mellitus with diabetic nephropathy, with long-term current use of insulin ([place]) 08/12/2015  . Depression 08/12/2015  . CKD (chronic kidney disease) stage 3, GFR 30-59 ml/min ([name]) 03/20/2014   PCP:  Azzie Glatter, [name] Pharmacy:   [facility], [address] [place] [address] Phone: [phone] Fax: [phone]     Social Determinants of Health (SDOH) Interventions    Readmission Risk Interventions No flowsheet data found.

## 2019-11-28 ENCOUNTER — Encounter (HOSPITAL_COMMUNITY): Payer: Self-pay | Admitting: Emergency Medicine

## 2019-11-28 ENCOUNTER — Emergency Department (HOSPITAL_COMMUNITY): Payer: Medicare Other

## 2019-11-28 ENCOUNTER — Inpatient Hospital Stay (HOSPITAL_COMMUNITY)
Admission: EM | Admit: 2019-11-28 | Discharge: 2019-12-04 | DRG: 637 | Disposition: A | Discharge status: Home Health | Payer: Medicare Other | Attending: Internal Medicine | Admitting: Internal Medicine

## 2019-11-28 DIAGNOSIS — G9341 Metabolic encephalopathy: Secondary | ICD-10-CM | POA: Diagnosis not present

## 2019-11-28 DIAGNOSIS — N179 Acute kidney failure, unspecified: Secondary | ICD-10-CM

## 2019-11-28 DIAGNOSIS — E11649 Type 2 diabetes mellitus with hypoglycemia without coma: Secondary | ICD-10-CM | POA: Diagnosis not present

## 2019-11-28 DIAGNOSIS — E785 Hyperlipidemia, unspecified: Secondary | ICD-10-CM | POA: Diagnosis present

## 2019-11-28 DIAGNOSIS — F329 Major depressive disorder, single episode, unspecified: Secondary | ICD-10-CM | POA: Diagnosis present

## 2019-11-28 DIAGNOSIS — Z79899 Other long term (current) drug therapy: Secondary | ICD-10-CM

## 2019-11-28 DIAGNOSIS — N1832 Chronic kidney disease, stage 3b: Secondary | ICD-10-CM | POA: Diagnosis present

## 2019-11-28 DIAGNOSIS — Z833 Family history of diabetes mellitus: Secondary | ICD-10-CM

## 2019-11-28 DIAGNOSIS — D649 Anemia, unspecified: Secondary | ICD-10-CM | POA: Diagnosis present

## 2019-11-28 DIAGNOSIS — Z87891 Personal history of nicotine dependence: Secondary | ICD-10-CM

## 2019-11-28 DIAGNOSIS — R4189 Other symptoms and signs involving cognitive functions and awareness: Secondary | ICD-10-CM | POA: Diagnosis not present

## 2019-11-28 DIAGNOSIS — E162 Hypoglycemia, unspecified: Secondary | ICD-10-CM | POA: Diagnosis present

## 2019-11-28 DIAGNOSIS — E160 Drug-induced hypoglycemia without coma: Secondary | ICD-10-CM | POA: Diagnosis present

## 2019-11-28 DIAGNOSIS — I130 Hypertensive heart and chronic kidney disease with heart failure and stage 1 through stage 4 chronic kidney disease, or unspecified chronic kidney disease: Secondary | ICD-10-CM | POA: Diagnosis present

## 2019-11-28 DIAGNOSIS — I5032 Chronic diastolic (congestive) heart failure: Secondary | ICD-10-CM | POA: Diagnosis present

## 2019-11-28 DIAGNOSIS — E875 Hyperkalemia: Secondary | ICD-10-CM | POA: Diagnosis not present

## 2019-11-28 DIAGNOSIS — T383X5A Adverse effect of insulin and oral hypoglycemic [antidiabetic] drugs, initial encounter: Secondary | ICD-10-CM

## 2019-11-28 DIAGNOSIS — G309 Alzheimer's disease, unspecified: Secondary | ICD-10-CM | POA: Diagnosis present

## 2019-11-28 DIAGNOSIS — E1165 Type 2 diabetes mellitus with hyperglycemia: Secondary | ICD-10-CM

## 2019-11-28 DIAGNOSIS — Z20822 Contact with and (suspected) exposure to covid-19: Secondary | ICD-10-CM | POA: Diagnosis present

## 2019-11-28 DIAGNOSIS — Z96641 Presence of right artificial hip joint: Secondary | ICD-10-CM | POA: Diagnosis present

## 2019-11-28 DIAGNOSIS — Z86718 Personal history of other venous thrombosis and embolism: Secondary | ICD-10-CM

## 2019-11-28 DIAGNOSIS — Z794 Long term (current) use of insulin: Secondary | ICD-10-CM

## 2019-11-28 DIAGNOSIS — R739 Hyperglycemia, unspecified: Secondary | ICD-10-CM

## 2019-11-28 DIAGNOSIS — Z8673 Personal history of transient ischemic attack (TIA), and cerebral infarction without residual deficits: Secondary | ICD-10-CM

## 2019-11-28 DIAGNOSIS — N189 Chronic kidney disease, unspecified: Secondary | ICD-10-CM

## 2019-11-28 DIAGNOSIS — Z7982 Long term (current) use of aspirin: Secondary | ICD-10-CM

## 2019-11-28 DIAGNOSIS — E1122 Type 2 diabetes mellitus with diabetic chronic kidney disease: Secondary | ICD-10-CM | POA: Diagnosis present

## 2019-11-28 DIAGNOSIS — N185 Chronic kidney disease, stage 5: Secondary | ICD-10-CM | POA: Diagnosis present

## 2019-11-28 DIAGNOSIS — F0280 Dementia in other diseases classified elsewhere without behavioral disturbance: Secondary | ICD-10-CM | POA: Diagnosis present

## 2019-11-28 DIAGNOSIS — E1121 Type 2 diabetes mellitus with diabetic nephropathy: Secondary | ICD-10-CM | POA: Diagnosis present

## 2019-11-28 DIAGNOSIS — D6489 Other specified anemias: Secondary | ICD-10-CM | POA: Diagnosis present

## 2019-11-28 LAB — CBG MONITORING, ED
Glucose-Capillary: 108 mg/dL — ABNORMAL HIGH (ref 70–99)
Glucose-Capillary: 126 mg/dL — ABNORMAL HIGH (ref 70–99)
Glucose-Capillary: 71 mg/dL (ref 70–99)
Glucose-Capillary: 67 mg/dL — ABNORMAL LOW (ref 70–99)

## 2019-11-28 LAB — GLUCOSE, CAPILLARY
Glucose-Capillary: 16 mg/dL — CL (ref 70–99)
Glucose-Capillary: 119 mg/dL — ABNORMAL HIGH (ref 70–99)
Glucose-Capillary: 396 mg/dL — ABNORMAL HIGH (ref 70–99)
Glucose-Capillary: 509 mg/dL (ref 70–99)

## 2019-11-28 LAB — URINALYSIS, ROUTINE W REFLEX MICROSCOPIC
Bilirubin Urine: NEGATIVE
Glucose, UA: 150 mg/dL — AB
Hgb urine dipstick: NEGATIVE
Ketones, ur: NEGATIVE mg/dL
Leukocytes,Ua: NEGATIVE
Nitrite: NEGATIVE
Protein, ur: >=300 mg/dL — AB
Specific Gravity, Urine: 1.013 (ref 1.005–1.030)
pH: 7 (ref 5.0–8.0)

## 2019-11-28 LAB — CBC WITH DIFFERENTIAL/PLATELET
Abs Immature Granulocytes: 0.07 10*3/uL (ref 0.00–0.07)
Basophils Absolute: 0 10*3/uL (ref 0.0–0.1)
Basophils Relative: 0 %
Eosinophils Absolute: 0 10*3/uL (ref 0.0–0.5)
Eosinophils Relative: 0 %
HCT: 30.7 % — ABNORMAL LOW (ref 39.0–52.0)
Hemoglobin: 9.8 g/dL — ABNORMAL LOW (ref 13.0–17.0)
Immature Granulocytes: 1 %
Lymphocytes Relative: 6 %
Lymphs Abs: 0.7 10*3/uL (ref 0.7–4.0)
MCH: 29.7 pg (ref 26.0–34.0)
MCHC: 31.9 g/dL (ref 30.0–36.0)
MCV: 93 fL (ref 80.0–100.0)
Monocytes Absolute: 0.9 10*3/uL (ref 0.1–1.0)
Monocytes Relative: 8 %
Neutro Abs: 9.6 10*3/uL — ABNORMAL HIGH (ref 1.7–7.7)
Neutrophils Relative %: 85 %
Platelets: 260 10*3/uL (ref 150–400)
RBC: 3.3 MIL/uL — ABNORMAL LOW (ref 4.22–5.81)
RDW: 14.3 % (ref 11.5–15.5)
WBC: 11.3 10*3/uL — ABNORMAL HIGH (ref 4.0–10.5)
nRBC: 0 % (ref 0.0–0.2)

## 2019-11-28 LAB — COMPREHENSIVE METABOLIC PANEL
ALT: 23 U/L (ref 0–44)
AST: 36 U/L (ref 15–41)
Albumin: 2.2 g/dL — ABNORMAL LOW (ref 3.5–5.0)
Alkaline Phosphatase: 65 U/L (ref 38–126)
Anion gap: 10 (ref 5–15)
BUN: 37 mg/dL — ABNORMAL HIGH (ref 8–23)
CO2: 22 mmol/L (ref 22–32)
Calcium: 8.2 mg/dL — ABNORMAL LOW (ref 8.9–10.3)
Chloride: 106 mmol/L (ref 98–111)
Creatinine, Ser: 2.39 mg/dL — ABNORMAL HIGH (ref 0.61–1.24)
GFR calc Af Amer: 30 mL/min — ABNORMAL LOW (ref >60)
GFR calc non Af Amer: 26 mL/min — ABNORMAL LOW (ref >60)
Glucose, Bld: 223 mg/dL — ABNORMAL HIGH (ref 70–99)
Potassium: 3.7 mmol/L (ref 3.5–5.1)
Sodium: 138 mmol/L (ref 135–145)
Total Bilirubin: 0.4 mg/dL (ref 0.3–1.2)
Total Protein: 5.6 g/dL — ABNORMAL LOW (ref 6.5–8.1)

## 2019-11-28 MED ORDER — HEPARIN SODIUM (PORCINE) 5000 UNIT/ML IJ SOLN
5000.0000 [IU] | Freq: Three times a day (TID) | INTRAMUSCULAR | Status: DC
  Administered 2019-11-28 – 2019-12-04 (×19): 5000 [IU] via SUBCUTANEOUS
  Filled 2019-11-28 (×19): qty 1

## 2019-11-28 MED ORDER — ASPIRIN 81 MG PO CHEW
81.0000 mg | CHEWABLE_TABLET | Freq: Every day | ORAL | Status: DC
  Administered 2019-11-28 – 2019-12-04 (×7): 81 mg via ORAL
  Filled 2019-11-28 (×7): qty 1

## 2019-11-28 MED ORDER — SODIUM CHLORIDE 0.9% FLUSH
3.0000 mL | Freq: Two times a day (BID) | INTRAVENOUS | Status: DC
  Administered 2019-11-28 – 2019-12-04 (×10): 3 mL via INTRAVENOUS

## 2019-11-28 MED ORDER — ACETAMINOPHEN 325 MG PO TABS: 650.0000 mg | ORAL_TABLET | Freq: Four times a day (QID) | ORAL | Status: DC | PRN

## 2019-11-28 MED ORDER — ATORVASTATIN CALCIUM 10 MG PO TABS
10.0000 mg | ORAL_TABLET | Freq: Every day | ORAL | Status: DC
  Administered 2019-11-28 – 2019-12-04 (×6): 10 mg via ORAL
  Filled 2019-11-28 (×7): qty 1

## 2019-11-28 MED ORDER — INSULIN ASPART 100 UNIT/ML ~~LOC~~ SOLN
4.0000 [IU] | Freq: Once | SUBCUTANEOUS | Status: AC
  Administered 2019-11-28: 22:00:00 4 [IU] via SUBCUTANEOUS

## 2019-11-28 MED ORDER — ONDANSETRON HCL 4 MG/2ML IJ SOLN: 4.0000 mg | Freq: Four times a day (QID) | INTRAMUSCULAR | Status: DC | PRN

## 2019-11-28 MED ORDER — ONDANSETRON HCL 4 MG PO TABS: 4.0000 mg | ORAL_TABLET | Freq: Four times a day (QID) | ORAL | Status: DC | PRN

## 2019-11-28 MED ORDER — AMLODIPINE BESYLATE 10 MG PO TABS
10.0000 mg | ORAL_TABLET | Freq: Every day | ORAL | Status: DC
  Administered 2019-11-28 – 2019-12-04 (×7): 10 mg via ORAL
  Filled 2019-11-28 (×2): qty 1
  Filled 2019-11-28: qty 2
  Filled 2019-11-28 (×4): qty 1

## 2019-11-28 MED ORDER — DEXTROSE 10 % IV SOLN: INTRAVENOUS | Status: DC

## 2019-11-28 MED ORDER — ACETAMINOPHEN 650 MG RE SUPP: 650.0000 mg | Freq: Four times a day (QID) | RECTAL | Status: DC | PRN

## 2019-11-28 MED ORDER — ALBUTEROL SULFATE (2.5 MG/3ML) 0.083% IN NEBU: 2.5000 mg | INHALATION_SOLUTION | Freq: Four times a day (QID) | RESPIRATORY_TRACT | Status: DC | PRN

## 2019-11-28 MED ORDER — FLUOXETINE HCL 20 MG PO CAPS
40.0000 mg | ORAL_CAPSULE | Freq: Every day | ORAL | Status: DC
  Administered 2019-11-28 – 2019-12-04 (×7): 40 mg via ORAL
  Filled 2019-11-28 (×8): qty 2

## 2019-11-28 NOTE — ED Provider Notes (Signed)
[facility] EMERGENCY DEPARTMENT Provider Note   CSN: [id number] Arrival date & time: 11/28/19  0857     History Chief Complaint  Patient presents with  . Hypoglycemia    [name] [name] is a 74 y.o. male.  [name] [name] is a 74 y.o. male with a history of Alzheimer's, CVA, diabetes, hypertension, vitamin B12 deficiency, who presents to the emergency department via EMS for hypoglycemia.  EMS called out by patient's sister this morning after he was found unresponsive laying on the couch, drooling and breathing but not able to answer questions.  When EMS arrived blood sugar was 42.  Patient was given glucagon and blood sugar steadily improved from 51-71-106, CBG is 108 on arrival.  Patient is able to provide limited history, states that he feels fine and is able to answer all yes or no questions.  He denies any pain anywhere.  He states that he lives at home but his sisters check on him twice a day and bring him his medications and meals.  He states he has not had any insulin this morning.  He does not remember what happened.  Denies any chest pain, shortness of breath, abdominal pain.  He has not had any recent vomiting or diarrhea.  Denies any fevers or urinary symptoms.  Denies any falls.  I spoke with patient's sister wife who found him this morning she states that he was laying on the couch drooling, he was breathing but was not responding to questions and she called EMS.  She reports that she found him in similar condition yesterday morning but he was more alert and she was able to get him to drink some juice and then he improved, he ate breakfast and seemed to be doing well and his blood sugar was improving.  Patient has not had any insulin since Saturday evening, Saturday afternoon patient received 2 units of Lantus with his meal, blood sugars were in the 200s per sister, when his sister returned in the evening to give him his dinner and check his sugar his blood sugar was 500 and  so he received 5 units of 70/30 mix insulin.  He has not received any insulin since then but is continued to have low blood sugars.  He was recently hospitalized for similar episodes of hypoglycemia on 2/9, was discharged from the hospital on 2/12, but even that night had blood sugars as low as 56, which improved with juice and food.  Patient did have a fall on Friday after coming home from the hospital per patient's sister, she is not sure if he hit his head.  She reports aside from these episodes of hypoglycemia he has been at baseline although more sluggish than usual spending most of the weekend on the couch.  She has not noted any fevers, cough, vomiting or diarrhea.        Past Medical History:  Diagnosis Date  . 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 Closed comminuted intertrochanteric fracture of left femur ([place])   . Diabetes mellitus without complication ([place])   . Diabetic hyperosmolar non-ketotic state ([place]) 05/15/2016  . DKA (diabetic ketoacidoses) ([place]) 05/09/2016  . Hypertension   . Postoperative anemia due to acute blood loss 06/18/2016  . Retroperitoneal hematoma 06/18/2016  . Stroke ([place])   . Vitamin B 12 deficiency 06/18/2016    Patient Active Problem List   Diagnosis Date Noted  . CVA (cerebral vascular accident) ([place]) 11/22/2019  .  Cognitive decline 11/22/2019  . Metabolic encephalopathy 28/76/8115  . ETOH abuse 05/17/2019  . Chronic diastolic CHF (congestive heart failure) ([place]) 05/17/2019  . DKA (diabetic ketoacidoses) ([place]) 05/17/2019  . Fall 10/03/2018  . Uncontrolled type 2 diabetes mellitus with hyperglycemia, with long-term current use of insulin ([place]) 10/03/2018  . Chest wall contusion 10/03/2018  . Underweight 11/18/2017  . Normocytic anemia 11/18/2017  . DKA, type 2 ([place]) 08/01/2017  . History of cerebrovascular accident (CVA) in adulthood 07/25/2017  . HTN (hypertension) 07/25/2017  . Depression with  anxiety 07/25/2017  . Left ventricular diastolic dysfunction 72/62/0355  . Dementia ([place]) 07/25/2017  . 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 Hypoglycemia due to insulin 06/27/2017  . Diabetes mellitus, insulin dependent (IDDM), controlled 06/27/2017  . Hypertension 06/27/2017  . Hyperkalemia 01/16/2017  . Acute metabolic encephalopathy 97/41/6384  . Weakness 11/16/2016  . Acute renal failure superimposed on stage 3 chronic kidney disease ([place])   . Noncompliance with medication regimen 08/17/2016  . History of DVT (deep vein thrombosis) 07/06/2016  . Vitamin B 12 deficiency 06/18/2016  . Hyperglycemia 05/15/2016  . Diabetic hyperosmolar non-ketotic state ([place]) 05/15/2016  . Hyponatremia 05/15/2016  . Anxiety 05/15/2016  . Narcotic dependency, continuous ([place]) 11/02/2015  . Uncontrolled type 2 diabetes mellitus with diabetic nephropathy, with long-term current use of insulin ([place]) 08/12/2015  . Depression 08/12/2015  . CKD (chronic kidney disease) stage 3, GFR 30-59 ml/min ([name]) 03/20/2014    Past Surgical History:  Procedure Laterality Date  . HIP ARTHROPLASTY Right 02/05/2013   Procedure: ARTHROPLASTY BIPOLAR HIP;  Surgeon: [name], MD;  Location: [facility];  Service: Orthopedics;  Laterality: Right;  . INTRAMEDULLARY (IM) NAIL INTERTROCHANTERIC Left 06/16/2016   Procedure: INTRAMEDULLARY (IM) NAIL INTERTROCHANTRIC;  Surgeon: Rod Can, MD;  Location: [facility];  Service: Orthopedics;  Laterality: Left;       Family History  Problem Relation Age of Onset  . Diabetes Mother   . Alzheimer's disease Mother   . Hypertension Mother   . Hyperlipidemia Father   . Hypertension Father   . Healthy Maternal Grandmother   . Pneumonia Maternal Grandfather     Social History   Tobacco Use  . Smoking status: Former Smoker    Packs/day: 0.25    Years: 30.00    Pack years: 7.50    Types: Cigarettes    Quit date: 02/01/2016    Years since quitting: 3.8  . Smokeless  tobacco: Never Used  Substance Use Topics  . Alcohol use: No  . Drug use: No    Home Medications Prior to Admission medications   Medication Sig Start Date End Date Taking? Authorizing Provider  ACCU-CHEK GUIDE test strip USE AS DIRECTED 11/21/19   Azzie Glatter, [name]  amLODipine (NORVASC) 10 MG tablet TAKE 1 TABLET ONCE DAILY. 09/28/19   Azzie Glatter, [name]  aspirin 81 MG chewable tablet Chew 1 tablet (81 mg total) by mouth daily. 11/25/19 02/23/20  Amin, Jeanella Flattery, MD  atorvastatin (LIPITOR) 10 MG tablet Take 1 tablet (10 mg total) by mouth daily. 11/02/19   Azzie Glatter, [name]  Blood Glucose Monitoring Suppl (ACCU-CHEK GUIDE) w/Device KIT 1 Device by Does not apply route QID. 11/02/19   Azzie Glatter, [name]  FLUoxetine (PROZAC) 40 MG capsule Take 1 capsule (40 mg total) by mouth daily. 11/09/19   Azzie Glatter, [name]  glucose blood (ACCU-CHEK GUIDE) test strip Use as instructed 11/03/18   Azzie Glatter, [name]  glucose blood (  TRUE METRIX BLOOD GLUCOSE TEST) test strip CHECK BLOOD SUGAR UP TO 4 TIMES A DAY. 05/25/18   Azzie Glatter, [name]  Insulin Glargine (LANTUS SOLOSTAR) 100 UNIT/ML Solostar Pen Inject 10-12 Units into the skin daily. 11/25/19   Amin, Jeanella Flattery, MD  insulin lispro (HUMALOG) 100 UNIT/ML injection Blood Sugar  Low Dose   60-150  No Insulin 151-200  1 units  201-250  3 units 251-300  5 units  301-350  6 units  >350              6 units Call MD  For blood sugar < 60 mg/dL . Call your Doctor -Drink Salli Real 11/25/19   Damita Lack, MD  Insulin Pen Needle (B-D ULTRAFINE III SHORT PEN) 31G X 8 MM MISC USE WITH INJECTIONS 11/02/19   Azzie Glatter, [name]  Insulin Syringe-Needle U-100 (BD INSULIN SYRINGE U/F) 31G X 5/16" 0.3 ML MISC Inject 1 each as directed 2 (two) times daily. as directed 11/02/19   Azzie Glatter, [name]  NOVOLOG MIX 70/30 (70-30) 100 UNIT/ML injection Inject 0.04-0.06 mLs (4-6 Units total) into the skin at bedtime. 11/25/19   Amin,  Jeanella Flattery, MD  Vitamin D, Ergocalciferol, (DRISDOL) 1.25 MG (50000 UNIT) CAPS capsule Take 1 capsule (50,000 Units total) by mouth once a week. 11/09/19   Azzie Glatter, [name]    Allergies    Patient has no known allergies.  Review of Systems   Review of Systems  Unable to perform ROS: Dementia    Physical Exam Updated Vital Signs BP (!) 183/88   Pulse 70   Temp (!) 97.4 F (36.3 C) (Oral)   Resp (!) 24   SpO2 100%   Physical Exam Vitals and nursing note reviewed.  Constitutional:      General: He is not in acute distress.    Appearance: He is well-developed. He is not diaphoretic.  HENT:     Head: Normocephalic and atraumatic.  Eyes:     General:        Right eye: No discharge.        Left eye: No discharge.     Pupils: Pupils are equal, round, and reactive to light.  Cardiovascular:     Rate and Rhythm: Normal rate and regular rhythm.     Heart sounds: Normal heart sounds.  Pulmonary:     Effort: Pulmonary effort is normal. No respiratory distress.     Breath sounds: Normal breath sounds. No wheezing or rales.  Abdominal:     General: Bowel sounds are normal. There is no distension.     Palpations: Abdomen is soft. There is no mass.     Tenderness: There is no abdominal tenderness. There is no guarding.  Musculoskeletal:        General: No deformity.     Cervical back: Neck supple.  Skin:    General: Skin is warm and dry.     Capillary Refill: Capillary refill takes less than 2 seconds.  Neurological:     Mental Status: He is alert.     Coordination: Coordination normal.     Comments: Speech is clear, able to follow commands CN III-XII intact Normal strength in upper and lower extremities bilaterally including dorsiflexion and plantar flexion, strong and equal grip strength Sensation normal to light and sharp touch Moves extremities without ataxia, coordination intact   Psychiatric:        Mood and Affect: Mood normal.  Behavior: Behavior  normal.     ED Results / Procedures / Treatments   Labs (all labs ordered are listed, but only abnormal results are displayed) Labs Reviewed  COMPREHENSIVE METABOLIC PANEL - Abnormal; Notable for the following components:      Result Value   Glucose, Bld 223 (*)    BUN 37 (*)    Creatinine, Ser 2.39 (*)    Calcium 8.2 (*)    Total Protein 5.6 (*)    Albumin 2.2 (*)    GFR calc non Af Amer 26 (*)    GFR calc Af Amer 30 (*)    All other components within normal limits  CBC WITH DIFFERENTIAL/PLATELET - Abnormal; Notable for the following components:   WBC 11.3 (*)    RBC 3.30 (*)    Hemoglobin 9.8 (*)    HCT 30.7 (*)    Neutro Abs 9.6 (*)    All other components within normal limits  URINALYSIS, ROUTINE W REFLEX MICROSCOPIC - Abnormal; Notable for the following components:   Glucose, UA 150 (*)    Protein, ur >=300 (*)    Bacteria, UA RARE (*)    All other components within normal limits  CBG MONITORING, ED - Abnormal; Notable for the following components:   Glucose-Capillary 108 (*)    All other components within normal limits  CBG MONITORING, ED - Abnormal; Notable for the following components:   Glucose-Capillary 126 (*)    All other components within normal limits  CBG MONITORING, ED - Abnormal; Notable for the following components:   Glucose-Capillary 67 (*)    All other components within normal limits  CBG MONITORING, ED  CBG MONITORING, ED  CBG MONITORING, ED  CBG MONITORING, ED  CBG MONITORING, ED  CBG MONITORING, ED    EKG None  Radiology CT Head Wo Contrast  Result Date: 11/28/2019 CLINICAL DATA:  Altered mental status EXAM: CT HEAD WITHOUT CONTRAST TECHNIQUE: Contiguous axial images were obtained from the base of the skull through the vertex without intravenous contrast. COMPARISON:  November 22, 2019 FINDINGS: Brain: There is mild diffuse atrophy. There is no intracranial mass, hemorrhage, extra-axial fluid collection, or midline shift. There is patchy  small vessel disease in the centra semiovale bilaterally. Brain parenchyma elsewhere appears unremarkable. No acute infarct is demonstrable. Vascular: There is no hyperdense vessel. There is calcification in the distal vertebral arteries and carotid siphon regions bilaterally. Skull: Bony calvarium appears intact. Sinuses/Orbits: There is mucosal thickening in the posterior right sphenoid sinus as well as in several ethmoid air cells. Orbits appear symmetric bilaterally. Other: Visualized mastoid air cells are clear. There is debris in each external auditory canal. IMPRESSION: Atrophy with periventricular small vessel disease. No acute infarct. No mass or hemorrhage. Appearance stable compared to recent study. There are foci of arterial vascular calcification. There are foci paranasal sinus disease. There is probable cerumen in each external auditory canal. Electronically Signed   By: Lowella Grip III M.D.   On: 11/28/2019 10:57   DG Chest Port 1 View  Result Date: 11/28/2019 CLINICAL DATA:  Hypoglycemia EXAM: PORTABLE CHEST 1 VIEW COMPARISON:  November 22, 2019 FINDINGS: The lungs are clear. Heart is upper normal in size with pulmonary vascularity normal. No adenopathy. No bone lesions. IMPRESSION: Lungs clear.  Heart upper normal in size.  No adenopathy. Electronically Signed   By: Lowella Grip III M.D.   On: 11/28/2019 09:45    Procedures .Critical Care Performed by: Jacqlyn Larsen, PA-C Authorized by:  Jacqlyn Larsen, PA-C   Critical care provider statement:    Critical care time (minutes):  45   Critical care was necessary to treat or prevent imminent or life-threatening deterioration of the following conditions:  Metabolic crisis (Hypoglycemia)   Critical care was time spent personally by me on the following activities:  Discussions with consultants, evaluation of patient's response to treatment, examination of patient, ordering and performing treatments and interventions, ordering and  review of laboratory studies, ordering and review of radiographic studies, pulse oximetry, re-evaluation of patient's condition, obtaining history from patient or surrogate and review of old charts   (including critical care time)  Medications Ordered in ED Medications  dextrose 10 % infusion ( Intravenous New Bag/Given 11/28/19 1346)  aspirin chewable tablet 81 mg (81 mg Oral Given 11/28/19 1531)  atorvastatin (LIPITOR) tablet 10 mg (10 mg Oral Given 11/28/19 1531)  FLUoxetine (PROZAC) capsule 40 mg (has no administration in time range)  amLODipine (NORVASC) tablet 10 mg (10 mg Oral Given 11/28/19 1531)  heparin injection 5,000 Units (5,000 Units Subcutaneous Given 11/28/19 1534)  sodium chloride flush (NS) 0.9 % injection 3 mL (3 mLs Intravenous Given 11/28/19 1531)  acetaminophen (TYLENOL) tablet 650 mg (has no administration in time range)    Or  acetaminophen (TYLENOL) suppository 650 mg (has no administration in time range)  ondansetron (ZOFRAN) tablet 4 mg (has no administration in time range)    Or  ondansetron (ZOFRAN) injection 4 mg (has no administration in time range)  albuterol (PROVENTIL) (2.5 MG/3ML) 0.083% nebulizer solution 2.5 mg (has no administration in time range)    ED Course  I have reviewed the triage vital signs and the nursing notes.  Pertinent labs & imaging results that were available during my care of the patient were reviewed by me and considered in my medical decision making (see chart for details).    MDM Rules/Calculators/A&P                     74 year old male presents with hypoglycemia, had recent admission for the same and was discharged Friday, despite changes to diabetes treatment regimen patient has continued to have multiple hypothermic episodes over the weekend, sugar 42 when EMS arrived this morning and patient minimally responsive, was given glucagon with improvement, blood sugar of 108 on arrival, patient without infectious symptoms, he is mildly  hypertensive but vitals are otherwise normal and he is in no acute distress.  Unclear etiology for patient's hypoglycemia he has not had any insulin since Saturday, had recent hospitalization and work-up for this that was largely unremarkable aside from some AKI, will get lab work and closely monitor patient's blood sugar.  Labs show minimal leukocytosis of 11.3, hemoglobin of 9.1, worsening renal function with creatinine of 2.39, BUN of 37, glucose continues to improve at 223, no other significant electrolyte derangements.  Urinalysis without signs of infection.  Chest x-ray is clear.  Head CT ordered as patient's family member reports a fall flying home from hospital on Friday, this does not show any acute abnormalities.  Unclear etiology for patient's hypoglycemia, on blood sugar rechecks patient sugar has begun to drop again down to 76, he lives at home alone with his sister's comp check on him but given these recurrent episodes of hypoglycemia despite changes to insulin regimen feel he will require admission for further evaluation, unclear whether this could be related to his worsening renal function versus other unknown cause.  Case discussed with Dr. Tamala Julian with  [facility] hospitalist who will see and admit the patient.   Final Clinical Impression(s) / ED Diagnoses Final diagnoses:  Hypoglycemia  AKI (acute kidney injury) [facility])    Rx / DC Orders ED Discharge Orders    None       Janet Berlin 11/30/19 2243    Lucrezia Starch, MD 12/01/19 [phone]

## 2019-11-28 NOTE — Progress Notes (Signed)
ER staff able to obtain USGIV in right upper arm. VAST services no longer needed at this time.

## 2019-11-28 NOTE — ED Notes (Signed)
Pt's CBG result was 108. Informed Maggie - RN and Katie - RN.

## 2019-11-28 NOTE — ED Notes (Signed)
Pt's CBG result was 126. Informed Katie - RN.

## 2019-11-28 NOTE — H&P (Signed)
History and Physical    [name] [name] MOL:[id number] DOB: [birth year] DOA: 11/28/2019  Referring MD/NP/PA:  Benedetto Goad, PA-C PCP: Azzie Glatter, [name]  Patient coming from: Home via EMS  Chief Complaint: Low mental status  I have personally briefly reviewed patient's old medical records in [place]   HPI: [name] [name] is a 74 y.o. male with medical history significant of hypertension, CVA with mild cognitive impairment, and DM type II. He presents with hypoglycemia.  Most of history is obtained from the patient's sister over the phone.  Patient lives alone, but sisters check on him through out the day. His sisters bring him food for breakfast and dinner and make sure his refrigerator is stocked.  This morning when they came to check on him they found him drooling and not responding like normal and therefore called EMS.  He just was hospitalized 2/9 -2/12 for an change in mental status due to hypoglycemia.  During that hospitalization he was taken off Metformin and glipizide as these were thought to be a cause for his low blood sugars.  He was discharged home to be on Lantus 10-12 units daily and Humalog sliding scale insulin.  On try to get him home patient's blood sugars dropped to 59 and he reportedly fell in the parking lot.  People nearby had to help get him to get him into the car.  They were able to get the patient to eat, but at baseline note that he is a picky eater.  2 days ago his blood sugar was elevated into the 500s.  He was given 2 units of Lantus and 5 units of 70/30 insulin.  Since that time he has had no other insulin, but blood sugars have been intermittently in the 50s.  Patient denies having any complaints and states that he feels fine.  ED Course: Upon admission into the emergency department patient was noted to be afebrile, respirations 18-24, blood pressure elevated up to 189/85, and all other vital signs maintained.  Labs significant for WBC 11.3, hemoglobin 9.8, BUN 37,  creatinine 2.39, glucose 223-> 71.  Patient was started on a D10 infusion. TRH called to admit.  Review of Systems  Constitutional: Negative for chills and fever.  HENT: Negative for congestion and ear discharge.   Eyes: Negative for photophobia and pain.  Respiratory: Negative for cough and shortness of breath.   Cardiovascular: Negative for chest pain and claudication.  Gastrointestinal: Negative for vomiting.  Genitourinary: Negative for dysuria and hematuria.  Musculoskeletal: Positive for falls. Negative for joint pain.  Skin: Negative for itching.  Neurological: Negative for focal weakness and loss of consciousness.  Psychiatric/Behavioral: Negative for substance abuse.    Past Medical History:  Diagnosis Date  . 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 Closed comminuted intertrochanteric fracture of left femur ([place])   . Diabetes mellitus without complication ([place])   . Diabetic hyperosmolar non-ketotic state ([place]) 05/15/2016  . DKA (diabetic ketoacidoses) ([place]) 05/09/2016  . Hypertension   . Postoperative anemia due to acute blood loss 06/18/2016  . Retroperitoneal hematoma 06/18/2016  . Stroke ([place])   . Vitamin B 12 deficiency 06/18/2016    Past Surgical History:  Procedure Laterality Date  . HIP ARTHROPLASTY Right 02/05/2013   Procedure: ARTHROPLASTY BIPOLAR HIP;  Surgeon: [name], MD;  Location: [facility];  Service: Orthopedics;  Laterality: Right;  . INTRAMEDULLARY (IM) NAIL INTERTROCHANTERIC Left 06/16/2016   Procedure: INTRAMEDULLARY (IM) NAIL INTERTROCHANTRIC;  Surgeon: Rod Can, MD;  Location: [facility];  Service: Orthopedics;  Laterality: Left;     reports that he quit smoking about 3 years ago. His smoking use included cigarettes. He has a 7.50 pack-year smoking history. He has never used smokeless tobacco. He reports that he does not drink alcohol or use drugs.  No Known Allergies  Family History  Problem  Relation Age of Onset  . Diabetes Mother   . Alzheimer's disease Mother   . Hypertension Mother   . Hyperlipidemia Father   . Hypertension Father   . Healthy Maternal Grandmother   . Pneumonia Maternal Grandfather     Prior to Admission medications   Medication Sig Start Date End Date Taking? Authorizing Provider  ACCU-CHEK GUIDE test strip USE AS DIRECTED 11/21/19  Yes Azzie Glatter, [name]  amLODipine (NORVASC) 10 MG tablet TAKE 1 TABLET ONCE DAILY. Patient taking differently: Take 10 mg by mouth daily.  09/28/19  Yes Azzie Glatter, [name]  aspirin 81 MG chewable tablet Chew 1 tablet (81 mg total) by mouth daily. 11/25/19 02/23/20 Yes Amin, Jeanella Flattery, MD  Blood Glucose Monitoring Suppl (ACCU-CHEK GUIDE) w/Device KIT 1 Device by Does not apply route QID. 11/02/19  Yes Azzie Glatter, [name]  FLUoxetine (PROZAC) 40 MG capsule Take 1 capsule (40 mg total) by mouth daily. 11/09/19  Yes Azzie Glatter, [name]  glucose blood (ACCU-CHEK GUIDE) test strip Use as instructed 11/03/18  Yes Kathe Becton M, [name]  glucose blood (TRUE METRIX BLOOD GLUCOSE TEST) test strip CHECK BLOOD SUGAR UP TO 4 TIMES A DAY. 05/25/18  Yes Azzie Glatter, [name]  Insulin Glargine (LANTUS SOLOSTAR) 100 UNIT/ML Solostar Pen Inject 10-12 Units into the skin daily. 11/25/19  Yes Amin, Ankit Chirag, MD  Insulin Pen Needle (B-D ULTRAFINE III SHORT PEN) 31G X 8 MM MISC USE WITH INJECTIONS 11/02/19  Yes Azzie Glatter, [name]  Insulin Syringe-Needle U-100 (BD INSULIN SYRINGE U/F) 31G X 5/16" 0.3 ML MISC Inject 1 each as directed 2 (two) times daily. as directed 11/02/19  Yes Azzie Glatter, [name]  Vitamin D, Ergocalciferol, (DRISDOL) 1.25 MG (50000 UNIT) CAPS capsule Take 1 capsule (50,000 Units total) by mouth once a week. 11/09/19  Yes Azzie Glatter, [name]  atorvastatin (LIPITOR) 10 MG tablet Take 1 tablet (10 mg total) by mouth daily. 11/02/19   Azzie Glatter, [name]  insulin lispro (HUMALOG) 100 UNIT/ML injection Blood Sugar   Low Dose   60-150  No Insulin 151-200  1 units  201-250  3 units 251-300  5 units  301-350  6 units  >350              6 units Call MD  For blood sugar < 60 mg/dL . Call your Doctor -Drink Salli Real Patient not taking: Reported on 11/28/2019 11/25/19   Damita Lack, MD  NOVOLOG MIX 70/30 (70-30) 100 UNIT/ML injection Inject 0.04-0.06 mLs (4-6 Units total) into the skin at bedtime. 11/25/19   Damita Lack, MD    Physical Exam:  Constitutional: Elderly male appears currently in NAD, calm, comfortable Vitals:   11/28/19 0901 11/28/19 0905 11/28/19 0911 11/28/19 1346  BP:  (!) 189/85 (!) 183/88 (!) 171/80  Pulse:  68 70 72  Resp:  (!) 23 (!) 24 20  Temp:   (!) 97.4 F (36.3 C)   TempSrc:  Oral Oral   SpO2: 100% 100% 100% 100%   Eyes: PERRL, lids and conjunctivae normal ENMT: Mucous  membranes are moist. Posterior pharynx clear of any exudate or lesions. Neck: normal, supple, no masses, no thyromegaly Respiratory: clear to auscultation bilaterally, no wheezing, no crackles. Normal respiratory effort. No accessory muscle use.  Cardiovascular: Regular rate and rhythm, no murmurs / rubs / gallops. No extremity edema. 2+ pedal pulses. No carotid bruits.  Abdomen: no tenderness, no masses palpated. No hepatosplenomegaly. Bowel sounds positive.  Musculoskeletal: no clubbing / cyanosis. No joint deformity upper and lower extremities. Good ROM, no contractures. Normal muscle tone.  Skin: no rashes, lesions, ulcers. No induration Neurologic: CN 2-12 grossly intact. Sensation intact, DTR normal. Strength 5/5 in all 4.  Psychiatric: Normal judgment and insight. Alert and oriented x 3. Normal mood.     Labs on Admission: I have personally reviewed following labs and imaging studies  CBC: Recent Labs  Lab 11/22/19 1250 11/22/19 2310 11/28/19 1006  WBC 12.6* 10.5 11.3*  NEUTROABS  --   --  9.6*  HGB 11.6* 11.1* 9.8*  HCT 35.7* 34.4* 30.7*  MCV 89.9 91.0 93.0  PLT 168  235 606   Basic Metabolic Panel: Recent Labs  Lab 11/22/19 1446 11/22/19 1600 11/22/19 1818 11/22/19 2310 11/28/19 1006  NA SPECIMEN HEMOLYZED. HEMOLYSIS MAY AFFECT INTEGRITY OF RESULTS. 143  --  Delta HEMOLYSIS MAY AFFECT INTEGRITY OF RESULTS. 2.7*  --  3.8 3.7  CL SPECIMEN HEMOLYZED. HEMOLYSIS MAY AFFECT INTEGRITY OF RESULTS. 111  --  110 106  CO2 SPECIMEN HEMOLYZED. HEMOLYSIS MAY AFFECT INTEGRITY OF RESULTS. 23  --  18* 22  GLUCOSE SPECIMEN HEMOLYZED. HEMOLYSIS MAY AFFECT INTEGRITY OF RESULTS. 36*  --  170* 223*  BUN SPECIMEN HEMOLYZED. HEMOLYSIS MAY AFFECT INTEGRITY OF RESULTS. 27*  --  26* 37*  CREATININE SPECIMEN HEMOLYZED. HEMOLYSIS MAY AFFECT INTEGRITY OF RESULTS. 2.24*  --  2.19* 2.39*  CALCIUM SPECIMEN HEMOLYZED. HEMOLYSIS MAY AFFECT INTEGRITY OF RESULTS. 8.3*  --  7.9* 8.2*  MG  --   --  2.2  --   --    GFR: Estimated Creatinine Clearance: 24.4 mL/min (A) (by C-G formula based on SCr of 2.39 mg/dL (H)). Liver Function Tests: Recent Labs  Lab 11/22/19 1446 11/22/19 1600 11/22/19 2310 11/28/19 1006  AST SPECIMEN HEMOLYZED. HEMOLYSIS MAY AFFECT INTEGRITY OF RESULTS. 31 30 36  ALT SPECIMEN HEMOLYZED. HEMOLYSIS MAY AFFECT INTEGRITY OF RESULTS. '17 18 23  ' ALKPHOS SPECIMEN HEMOLYZED. HEMOLYSIS MAY AFFECT INTEGRITY OF RESULTS. 53 56 65  BILITOT SPECIMEN HEMOLYZED. HEMOLYSIS MAY AFFECT INTEGRITY OF RESULTS. 0.6 0.8 0.4  PROT SPECIMEN HEMOLYZED. HEMOLYSIS MAY AFFECT INTEGRITY OF RESULTS. 6.2* 5.5* 5.6*  ALBUMIN SPECIMEN HEMOLYZED. HEMOLYSIS MAY AFFECT INTEGRITY OF RESULTS. 2.7* 2.4* 2.2*   No results for input(s): LIPASE, AMYLASE in the last 168 hours. No results for input(s): AMMONIA in the last 168 hours. Coagulation Profile: No results for input(s): INR, PROTIME in the last 168 hours. Cardiac Enzymes: No results for input(s): CKTOTAL, CKMB, CKMBINDEX, TROPONINI in the last 168 hours. BNP (last 3 results) No results for input(s): PROBNP in the  last 8760 hours. HbA1C: No results for input(s): HGBA1C in the last 72 hours. CBG: Recent Labs  Lab 11/25/19 1046 11/25/19 1204 11/28/19 0904 11/28/19 1119 11/28/19 1316  GLUCAP 320* 199* 108* 126* 71   Lipid Profile: No results for input(s): CHOL, HDL, LDLCALC, TRIG, CHOLHDL, LDLDIRECT in the last 72 hours. Thyroid Function Tests: No results for input(s): TSH, T4TOTAL, FREET4, T3FREE, THYROIDAB in the last 72 hours. Anemia Panel: No results for input(s):  VITAMINB12, FOLATE, FERRITIN, TIBC, IRON, RETICCTPCT in the last 72 hours. Urine analysis:    Component Value Date/Time   COLORURINE STRAW (A) 11/22/2019 1600   APPEARANCEUR CLEAR 11/22/2019 1600   LABSPEC 1.011 11/22/2019 1600   PHURINE 6.0 11/22/2019 1600   GLUCOSEU 150 (A) 11/22/2019 1600   HGBUR SMALL (A) 11/22/2019 1600   BILIRUBINUR NEGATIVE 11/22/2019 1600   BILIRUBINUR Negative 11/09/2019 0910   KETONESUR NEGATIVE 11/22/2019 1600   PROTEINUR 100 (A) 11/22/2019 1600   UROBILINOGEN 0.2 11/09/2019 0910   UROBILINOGEN 0.2 05/12/2017 0939   NITRITE NEGATIVE 11/22/2019 1600   LEUKOCYTESUR NEGATIVE 11/22/2019 1600   Sepsis Labs: Recent Results (from the past 240 hour(s))  Urine culture     Status: None   Collection Time: 11/22/19  4:00 PM   Specimen: Urine, Random  Result Value Ref Range Status   Specimen Description URINE, RANDOM  Final   Special Requests NONE  Final   Culture   Final    NO GROWTH Performed at [facility], [place] [address] [place], [place] [address]    Report Status 11/23/2019 FINAL  Final  SARS CORONAVIRUS 2 (TAT 6-24 HRS) Nasopharyngeal Nasopharyngeal Swab     Status: None   Collection Time: 11/22/19  4:08 PM   Specimen: Nasopharyngeal Swab  Result Value Ref Range Status   SARS Coronavirus 2 NEGATIVE NEGATIVE Final    Comment: (NOTE) SARS-CoV-2 target nucleic acids are NOT DETECTED. The SARS-CoV-2 RNA is generally detectable in upper and lower respiratory specimens during the acute  phase of infection. Negative results do not preclude SARS-CoV-2 infection, do not rule out co-infections with other pathogens, and should not be used as the sole basis for treatment or other patient management decisions. Negative results must be combined with clinical observations, patient history, and epidemiological information. The expected result is Negative. Fact Sheet for Patients: SugarRoll.be Fact Sheet for Healthcare Providers: https://www.woods-mathews.com/ This test is not yet approved or cleared by the [place] FDA and  has been authorized for detection and/or diagnosis of SARS-CoV-2 by FDA under an Emergency Use Authorization (EUA). This EUA will remain  in effect (meaning this test can be used) for the duration of the COVID-19 declaration under Section 56 4(b)(1) of the Act, 21 U.S.C. section 360bbb-3(b)(1), unless the authorization is terminated or revoked sooner. Performed at [facility], [place] [address] [place], [place] [address]      Radiological Exams on Admission: CT Head Wo Contrast  Result Date: 11/28/2019 CLINICAL DATA:  Altered mental status EXAM: CT HEAD WITHOUT CONTRAST TECHNIQUE: Contiguous axial images were obtained from the base of the skull through the vertex without intravenous contrast. COMPARISON:  November 22, 2019 FINDINGS: Brain: There is mild diffuse atrophy. There is no intracranial mass, hemorrhage, extra-axial fluid collection, or midline shift. There is patchy small vessel disease in the centra semiovale bilaterally. Brain parenchyma elsewhere appears unremarkable. No acute infarct is demonstrable. Vascular: There is no hyperdense vessel. There is calcification in the distal vertebral arteries and carotid siphon regions bilaterally. Skull: Bony calvarium appears intact. Sinuses/Orbits: There is mucosal thickening in the posterior right sphenoid sinus as well as in several ethmoid air cells. Orbits  appear symmetric bilaterally. Other: Visualized mastoid air cells are clear. There is debris in each external auditory canal. IMPRESSION: Atrophy with periventricular small vessel disease. No acute infarct. No mass or hemorrhage. Appearance stable compared to recent study. There are foci of arterial vascular calcification. There are foci paranasal sinus disease. There  is probable cerumen in each external auditory canal. Electronically Signed   By: Lowella Grip III M.D.   On: 11/28/2019 10:57   DG Chest Port 1 View  Result Date: 11/28/2019 CLINICAL DATA:  Hypoglycemia EXAM: PORTABLE CHEST 1 VIEW COMPARISON:  November 22, 2019 FINDINGS: The lungs are clear. Heart is upper normal in size with pulmonary vascularity normal. No adenopathy. No bone lesions. IMPRESSION: Lungs clear.  Heart upper normal in size.  No adenopathy. Electronically Signed   By: Lowella Grip III M.D.   On: 11/28/2019 09:45    EKG: Independently reviewed.  Sinus rhythm at 62 bpm  Assessment/Plan Acute metabolic encephalopathy secondary to hypoglycemia: Recurrent.  Patient presents again after being found to acutely altered.  Initial blood glucose noted to be 42.  He had received glucagon in route with EMS.  Patient was started on D10 drip at 75 mL/h.  Home regimen was supposed to be 10-12 units of Lantus daily and Humalog sliding scale.   He has only received 5 units of 70/30 mix and 2 units of Lantus on 2/13 when blood sugars were 500, but has had persistent lows.   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  Baseline creatinine previously noted to be around 1.7-1.8 in  2020.  Suspect secondary to poor intake and avoid dehydration. -IV fluids as seen above -Check BMP in a.m.  History of CVA -Continue aspirin and statin  Chronic normocytic anemia: Hemoglobin 9.8 g/dL on admission.  Baseline appears to range from 10-12.  No reports of bleeding. -Continue to monitor  Essential hypertension: Home medications include amlodipine 10 mg daily -Continue amlodipine  Hyperlipidemia -Continue atorvastatin  Depression -Continue Prozac   Cognitive impairment: Since previous stroke in 2016 patient has had some cognitive decline.  But has been able to care for himself at home with his sisters coming to check on him.   DVT prophylaxis: humalog Code Status: Full Family Communication: Discussed plan of care with the patient's family over the phone Disposition Plan: Possible discharge home in 1 to 2 days Consults called: Diabetic educator Admission status: Observation  Norval Morton MD [facility] Pager [phone]   If 7PM-7AM, please contact night-coverage [email or web address] Password [facility]  11/28/2019, 2:06 PM

## 2019-11-28 NOTE — ED Triage Notes (Signed)
Per [facility] pt coming from home, sister came to check on patient and pt was laying on couch, slouched over and drooling. Initial CBG 42 glucogon administered CBG 51-71-106. Patient alert and orientated x 4. Lives by himself, sisters check on him regularly.

## 2019-11-29 ENCOUNTER — Other Ambulatory Visit: Payer: Self-pay

## 2019-11-29 ENCOUNTER — Encounter (HOSPITAL_COMMUNITY): Payer: Self-pay | Admitting: Internal Medicine

## 2019-11-29 DIAGNOSIS — F329 Major depressive disorder, single episode, unspecified: Secondary | ICD-10-CM | POA: Diagnosis present

## 2019-11-29 DIAGNOSIS — Z87891 Personal history of nicotine dependence: Secondary | ICD-10-CM | POA: Diagnosis not present

## 2019-11-29 DIAGNOSIS — E1122 Type 2 diabetes mellitus with diabetic chronic kidney disease: Secondary | ICD-10-CM | POA: Diagnosis present

## 2019-11-29 DIAGNOSIS — Z794 Long term (current) use of insulin: Secondary | ICD-10-CM | POA: Diagnosis not present

## 2019-11-29 DIAGNOSIS — E875 Hyperkalemia: Secondary | ICD-10-CM | POA: Diagnosis not present

## 2019-11-29 DIAGNOSIS — F0280 Dementia in other diseases classified elsewhere without behavioral disturbance: Secondary | ICD-10-CM | POA: Diagnosis present

## 2019-11-29 DIAGNOSIS — Z8673 Personal history of transient ischemic attack (TIA), and cerebral infarction without residual deficits: Secondary | ICD-10-CM | POA: Diagnosis not present

## 2019-11-29 DIAGNOSIS — Z86718 Personal history of other venous thrombosis and embolism: Secondary | ICD-10-CM | POA: Diagnosis not present

## 2019-11-29 DIAGNOSIS — E160 Drug-induced hypoglycemia without coma: Secondary | ICD-10-CM | POA: Diagnosis not present

## 2019-11-29 DIAGNOSIS — E162 Hypoglycemia, unspecified: Secondary | ICD-10-CM

## 2019-11-29 DIAGNOSIS — E1121 Type 2 diabetes mellitus with diabetic nephropathy: Secondary | ICD-10-CM | POA: Diagnosis present

## 2019-11-29 DIAGNOSIS — Z7982 Long term (current) use of aspirin: Secondary | ICD-10-CM | POA: Diagnosis not present

## 2019-11-29 DIAGNOSIS — D6489 Other specified anemias: Secondary | ICD-10-CM | POA: Diagnosis present

## 2019-11-29 DIAGNOSIS — Z833 Family history of diabetes mellitus: Secondary | ICD-10-CM | POA: Diagnosis not present

## 2019-11-29 DIAGNOSIS — Z79899 Other long term (current) drug therapy: Secondary | ICD-10-CM | POA: Diagnosis not present

## 2019-11-29 DIAGNOSIS — I130 Hypertensive heart and chronic kidney disease with heart failure and stage 1 through stage 4 chronic kidney disease, or unspecified chronic kidney disease: Secondary | ICD-10-CM | POA: Diagnosis present

## 2019-11-29 DIAGNOSIS — N179 Acute kidney failure, unspecified: Secondary | ICD-10-CM | POA: Diagnosis present

## 2019-11-29 DIAGNOSIS — I5032 Chronic diastolic (congestive) heart failure: Secondary | ICD-10-CM | POA: Diagnosis present

## 2019-11-29 DIAGNOSIS — Z20822 Contact with and (suspected) exposure to covid-19: Secondary | ICD-10-CM | POA: Diagnosis present

## 2019-11-29 DIAGNOSIS — Z96641 Presence of right artificial hip joint: Secondary | ICD-10-CM | POA: Diagnosis present

## 2019-11-29 DIAGNOSIS — G309 Alzheimer's disease, unspecified: Secondary | ICD-10-CM | POA: Diagnosis present

## 2019-11-29 DIAGNOSIS — E11649 Type 2 diabetes mellitus with hypoglycemia without coma: Secondary | ICD-10-CM | POA: Diagnosis present

## 2019-11-29 DIAGNOSIS — G9341 Metabolic encephalopathy: Secondary | ICD-10-CM | POA: Diagnosis present

## 2019-11-29 DIAGNOSIS — T383X5A Adverse effect of insulin and oral hypoglycemic [antidiabetic] drugs, initial encounter: Secondary | ICD-10-CM | POA: Diagnosis present

## 2019-11-29 DIAGNOSIS — E785 Hyperlipidemia, unspecified: Secondary | ICD-10-CM | POA: Diagnosis present

## 2019-11-29 DIAGNOSIS — N1832 Chronic kidney disease, stage 3b: Secondary | ICD-10-CM | POA: Diagnosis present

## 2019-11-29 HISTORY — DX: Hypoglycemia, unspecified: E16.2

## 2019-11-29 LAB — CBC
HCT: 34 % — ABNORMAL LOW (ref 39.0–52.0)
Hemoglobin: 11.1 g/dL — ABNORMAL LOW (ref 13.0–17.0)
MCH: 29.7 pg (ref 26.0–34.0)
MCHC: 32.6 g/dL (ref 30.0–36.0)
MCV: 90.9 fL (ref 80.0–100.0)
Platelets: 225 10*3/uL (ref 150–400)
RBC: 3.74 MIL/uL — ABNORMAL LOW (ref 4.22–5.81)
RDW: 14.2 % (ref 11.5–15.5)
WBC: 8.6 10*3/uL (ref 4.0–10.5)
nRBC: 0 % (ref 0.0–0.2)

## 2019-11-29 LAB — BASIC METABOLIC PANEL
Anion gap: 12 (ref 5–15)
BUN: 44 mg/dL — ABNORMAL HIGH (ref 8–23)
CO2: 21 mmol/L — ABNORMAL LOW (ref 22–32)
Calcium: 8.2 mg/dL — ABNORMAL LOW (ref 8.9–10.3)
Chloride: 104 mmol/L (ref 98–111)
Creatinine, Ser: 2.48 mg/dL — ABNORMAL HIGH (ref 0.61–1.24)
GFR calc Af Amer: 29 mL/min — ABNORMAL LOW (ref >60)
GFR calc non Af Amer: 25 mL/min — ABNORMAL LOW (ref >60)
Glucose, Bld: 179 mg/dL — ABNORMAL HIGH (ref 70–99)
Potassium: 3.8 mmol/L (ref 3.5–5.1)
Sodium: 137 mmol/L (ref 135–145)

## 2019-11-29 LAB — GLUCOSE, CAPILLARY
Glucose-Capillary: 234 mg/dL — ABNORMAL HIGH (ref 70–99)
Glucose-Capillary: 255 mg/dL — ABNORMAL HIGH (ref 70–99)
Glucose-Capillary: 373 mg/dL — ABNORMAL HIGH (ref 70–99)
Glucose-Capillary: 393 mg/dL — ABNORMAL HIGH (ref 70–99)
Glucose-Capillary: 426 mg/dL — ABNORMAL HIGH (ref 70–99)
Glucose-Capillary: 82 mg/dL (ref 70–99)

## 2019-11-29 MED ORDER — INSULIN ASPART 100 UNIT/ML ~~LOC~~ SOLN
5.0000 [IU] | Freq: Once | SUBCUTANEOUS | Status: AC
  Administered 2019-11-29: 5 [IU] via SUBCUTANEOUS

## 2019-11-29 MED ORDER — INSULIN ASPART PROT & ASPART (70-30 MIX) 100 UNIT/ML ~~LOC~~ SUSP: 4.0000 [IU] | Freq: Two times a day (BID) | SUBCUTANEOUS | Status: DC

## 2019-11-29 MED ORDER — INSULIN ASPART PROT & ASPART (70-30 MIX) 100 UNIT/ML ~~LOC~~ SUSP
4.0000 [IU] | Freq: Two times a day (BID) | SUBCUTANEOUS | Status: DC
  Filled 2019-11-29: qty 10

## 2019-11-29 MED ORDER — INSULIN ASPART PROT & ASPART (70-30 MIX) 100 UNIT/ML ~~LOC~~ SUSP
4.0000 [IU] | Freq: Two times a day (BID) | SUBCUTANEOUS | Status: DC
  Administered 2019-11-29: 18:00:00 4 [IU] via SUBCUTANEOUS
  Filled 2019-11-29: qty 10

## 2019-11-29 MED ORDER — INSULIN ASPART 100 UNIT/ML ~~LOC~~ SOLN
0.0000 [IU] | Freq: Three times a day (TID) | SUBCUTANEOUS | Status: DC
  Administered 2019-11-29 – 2019-11-30 (×3): 5 [IU] via SUBCUTANEOUS

## 2019-11-29 NOTE — TOC Initial Note (Signed)
Transition of Care [facility]) - Initial/Assessment Note    Patient Details  Name: [name] [name] MRN: [id number] Date of Birth: 1946-08-19  [facility] of [facility]) CM/SW Contact:    Marilu Favre, RN Phone Number: 11/29/2019, 4:28 PM  Clinical Narrative:                 Spoke to patient , gave permission to call sister [name] [phone] . Patient from home alone , however [name] and another sister check on patient and provide assistance and transportation. Patient active with [facility] for PT, and OT , MD added RN for medication assistance . [name] in agreement   Expected Discharge Plan: [place]     Patient Goals and CMS Choice Patient states their goals for this hospitalization and ongoing recovery are:: to go home CMS Medicare.gov Compare Post Acute Care list provided to:: Patient Choice offered to / list presented to : Patient  Expected Discharge Plan and Services Expected Discharge Plan: [place]   Discharge Planning Services: CM Consult Post Acute Care Choice: [place] arrangements for the past 2 months: Single Family Home                 DME Arranged: N/A         HH Arranged: PT, OT, RN [place] Agency: [facility] Date [place]: 11/29/19 Time HH Agency Contacted: [id number] Representative spoke with at [place] Arrangements/Services Living arrangements for the past 2 months: [place] with:: Self Patient language and need for interpreter reviewed:: Yes Do you feel safe going back to the place where you live?: Yes      Need for Family Participation in Patient Care: Yes (Comment) Care giver support system in place?: Yes (comment) Current home services: Home PT, Home OT Criminal Activity/Legal Involvement Pertinent to Current Situation/Hospitalization: No - Comment as needed  Activities of Daily Living Home Assistive Devices/Equipment: None ADL Screening (condition at time  of admission) Patient's cognitive ability adequate to safely complete daily activities?: Yes Is the patient deaf or have difficulty hearing?: Yes Does the patient have difficulty seeing, even when wearing glasses/contacts?: No Does the patient have difficulty concentrating, remembering, or making decisions?: No Patient able to express need for assistance with ADLs?: Yes Does the patient have difficulty dressing or bathing?: No Independently performs ADLs?: Yes (appropriate for developmental age) Does the patient have difficulty walking or climbing stairs?: Yes Weakness of Legs: Both Weakness of Arms/Hands: None  Permission Sought/Granted   Permission granted to share information with : Yes, Verbal Permission Granted  Share Information with NAME: Gains [place] [phone] [place] granted to share info w AGENCY: [facility]  Permission granted to share info w Relationship: sister     Emotional Assessment Appearance:: Appears stated age Attitude/Demeanor/Rapport: Engaged Affect (typically observed): Accepting Orientation: : Oriented to Situation, Oriented to  Time, Oriented to Place, Oriented to Self Alcohol / Substance Use: Not Applicable Psych Involvement: No (comment)  Admission diagnosis:  Hypoglycemia [E16.2] AKI (acute kidney injury) ([place]) [N17.9] Patient Active Problem List   Diagnosis Date Noted  . Hypoglycemia 11/28/2019  . CVA (cerebral vascular accident) ([place]) 11/22/2019  . Cognitive decline 11/22/2019  . Metabolic encephalopathy 0000000  . ETOH abuse 05/17/2019  . Chronic diastolic CHF (congestive heart failure) ([place]) 05/17/2019  . DKA (diabetic ketoacidoses) ([place]) 05/17/2019  . Fall 10/03/2018  . Uncontrolled type 2 diabetes mellitus with hyperglycemia, with  long-term current use of insulin ([place]) 10/03/2018  . Chest wall contusion 10/03/2018  . Underweight 11/18/2017  . Normocytic anemia 11/18/2017  . DKA, type 2 ([place]) 08/01/2017  . History of cerebrovascular  accident (CVA) in adulthood 07/25/2017  . HTN (hypertension) 07/25/2017  . Depression with anxiety 07/25/2017  . Left ventricular diastolic dysfunction [id number]  . Dementia ([place]) 07/25/2017  . 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 Hypoglycemia due to insulin 06/27/2017  . Diabetes mellitus, insulin dependent (IDDM), controlled 06/27/2017  . Hypertension 06/27/2017  . Hyperkalemia 01/16/2017  . Acute metabolic encephalopathy [id number]  . Weakness 11/16/2016  . Acute kidney injury superimposed on chronic kidney disease ([place])   . Noncompliance with medication regimen 08/17/2016  . History of DVT (deep vein thrombosis) 07/06/2016  . Vitamin B 12 deficiency 06/18/2016  . Hyperglycemia 05/15/2016  . Diabetic hyperosmolar non-ketotic state ([place]) 05/15/2016  . Hyponatremia 05/15/2016  . Anxiety 05/15/2016  . Narcotic dependency, continuous ([place]) 11/02/2015  . Uncontrolled type 2 diabetes mellitus with diabetic nephropathy, with long-term current use of insulin ([place]) 08/12/2015  . Depression 08/12/2015  . CKD (chronic kidney disease) stage 3, GFR 30-59 ml/min ([name]) 03/20/2014   PCP:  Azzie Glatter, [name] Pharmacy:   [facility], [place] [address] Phone: [phone] Fax: [phone]     Social Determinants of Health (SDOH) Interventions    Readmission Risk Interventions No flowsheet data found.

## 2019-11-29 NOTE — Progress Notes (Signed)
Inpatient Diabetes Program Recommendations  AACE/ADA: New Consensus Statement on Inpatient Glycemic Control (2015)  Target Ranges:  Prepandial:   less than 140 mg/dL      Peak postprandial:   less than 180 mg/dL (1-2 hours)      Critically ill patients:  140 - 180 mg/dL   Lab Results  Component Value Date   GLUCAP 234 (H) 11/29/2019   HGBA1C 10.7 (H) 11/23/2019    Review of Glycemic Control  Diabetes history: DM2 Outpatient Diabetes medications: Lantus 10-12 units q am + Novolog 70/30 insulin mix 4-6 units ac supper Current orders for Inpatient glycemic control:   Inpatient Diabetes Program Recommendations:   -Custom Novolog correction scale 0-5 units       151-200  1 unit      201-250  2 units      251-300  3 units      301-350  4 units      351-400  5 units -Novolog 70/30 4 units bid ac meals  I spoke with sister [name] listed as contact. [name] said his other sister gives am Lantus insulin 10-12 units and Faye gives pm 70/30 4-6 units ac supper and gets him to eat. States he didn't take Glucotrol since came home from hospital after last admission and CBGs remained low.  Thank you, Nani Gasser. Freya Zobrist, RN, MSN, CDE  Diabetes Coordinator Inpatient Glycemic Control Team Team Pager [phone] (8am-5pm) 11/29/2019 10:37 AM

## 2019-11-29 NOTE — Progress Notes (Signed)
Progress Note    [name] [name]  [id number] DOB: Mar 30, 1946  DOA: 11/28/2019 PCP: Azzie Glatter, [name]    Brief Narrative:   Chief complaint: acute encephalopathy  Medical records reviewed and are as summarized below:  [name] [name] is an 74 y.o. 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  Discharged 5 days ago after a 3-day hospitalization for same.  He reports his sisters manage his medications.  Assessment/Plan:   Principal Problem:   Hypoglycemia due to insulin Active Problems:   Acute kidney injury superimposed on chronic kidney disease ([name])   Acute metabolic encephalopathy   Cognitive decline   History of cerebrovascular accident (CVA) in adulthood   Normocytic anemia   Chronic diastolic CHF (congestive heart failure) ([place])   #1.  Acute encephalopathy secondary to hypoglycemia due to insulin. Recurrent.   Resolved this morning.  Patient discharged 5 days ago with same. Has cognitive impairment and sisters assit with insulin.  Reportedly blood sugars running low so 7030 mix and Lantus doses adjusted and on February 13 blood sugars were 500 and insulin doses not adjusted.  Also chart review indicates sister reports patient not taking Glucotrol since his discharge last week and his blood sugars became low.  Provided with D10 CBGs every 4 hours.  During this time CBGs ranged from 82-3 93. -Discontinue D10 -Custom NovoLog scale per diabetes coordinator as well as NovoLog 70/30 4 units twice daily with meals. -Monitor closely -adjust insulin as indicated  #2.  Hypoglycemia due to insulin.  See #1  #3.  Acute kidney injury superimposed on chronic kidney disease stage III.  Creatinine 2.39 on admission.  This appears to be above baseline. -Hold nephrotoxins -Stop IV fluids as noted above -Monitor urine output -Recheck in  the morning  #4.  Hypertension.  Home medications include amlodipine.  Blood pressure is high end of normal. -Monitor closely -consider adding second agent  #5.  History of CVA. -Continue home aspirin and statin  #6.  chronic normocytic anemia.  Stable.   Family Communication/Anticipated D/C date and plan/Code Status   DVT prophylaxis: Lovenox ordered. Code Status: Full Code.  Family Communication: sister Disposition Plan: home when ready   Medical Consultants:    Diabetes coordinator   Anti-Infectives:    None  Subjective:   Awake alert oriented to time and place.  Watching TV.  Denies any pain or discomfort  Objective:    Vitals:   11/28/19 1626 11/28/19 1945 11/29/19 0429 11/29/19 0756  BP: (!) 173/117 (!) 147/86 (!) 153/98 (!) 159/90  Pulse: 70 85 81 77  Resp: 18 18 14 18   Temp: 98.6 F (37 C) 98.9 F (37.2 C) 98.8 F (37.1 C) 98.8 F (37.1 C)  TempSrc: Oral Oral Oral Oral  SpO2: 100% 100% 98% 97%    Intake/Output Summary (Last 24 hours) at 11/29/2019 1233 Last data filed at 11/29/2019 0700 Gross per 24 hour  Intake 1252.32 ml  Output 1400 ml  Net -147.68 ml   There were no vitals filed for this visit.  Exam: General: Awake alert well-nourished no acute distress CV: Regular rate and rhythm no murmur gallop or rub no lower extremity edema Respiratory: No increased work of breathing breath sounds are clear bilaterally I hear no rhonchi no wheezes Abdomen: Nondistended soft positive bowel sounds throughout nontender to palpation no guarding or rebounding Musculoskeletal: Joints without  swelling/erythema full range of motion moving all extremities spontaneously Neuro: Alert and oriented x3 speech clear facial symmetry tongue midline bilateral grip 5 out of 5 cranial nerves II through XII grossly intact  Data Reviewed:   I have personally reviewed following labs and imaging studies:  Labs: Labs show the following:   Basic Metabolic  Panel: Recent Labs  Lab 11/22/19 1446 11/22/19 1446 11/22/19 1600 11/22/19 1600 11/22/19 1818 11/22/19 2310 11/22/19 2310 11/28/19 1006 11/29/19 0253  NA SPECIMEN HEMOLYZED. HEMOLYSIS MAY AFFECT INTEGRITY OF RESULTS.  --  143  --   --  139  --  [place]. HEMOLYSIS MAY AFFECT INTEGRITY OF RESULTS.   < > 2.7*   < >  --  3.8   < > 3.7 3.8  CL SPECIMEN HEMOLYZED. HEMOLYSIS MAY AFFECT INTEGRITY OF RESULTS.  --  111  --   --  110  --  106 104  CO2 SPECIMEN HEMOLYZED. HEMOLYSIS MAY AFFECT INTEGRITY OF RESULTS.  --  23  --   --  18*  --  22 21*  GLUCOSE SPECIMEN HEMOLYZED. HEMOLYSIS MAY AFFECT INTEGRITY OF RESULTS.  --  36*  --   --  170*  --  223* 179*  BUN SPECIMEN HEMOLYZED. HEMOLYSIS MAY AFFECT INTEGRITY OF RESULTS.  --  27*  --   --  26*  --  1* 39*  CREATININE SPECIMEN HEMOLYZED. HEMOLYSIS MAY AFFECT INTEGRITY OF RESULTS.  --  2.24*  --   --  2.19*  --  2.39* 2.48*  CALCIUM SPECIMEN HEMOLYZED. HEMOLYSIS MAY AFFECT INTEGRITY OF RESULTS.  --  8.3*  --   --  7.9*  --  8.2* 8.2*  MG  --   --   --   --  2.2  --   --   --   --    < > = values in this interval not displayed.   GFR Estimated Creatinine Clearance: 23.5 mL/min (A) (by C-G formula based on SCr of 2.48 mg/dL (H)). Liver Function Tests: Recent Labs  Lab 11/22/19 1446 11/22/19 1600 11/22/19 2310 11/28/19 1006  AST SPECIMEN HEMOLYZED. HEMOLYSIS MAY AFFECT INTEGRITY OF RESULTS. 31 30 36  ALT SPECIMEN HEMOLYZED. HEMOLYSIS MAY AFFECT INTEGRITY OF RESULTS. 17 18 23   ALKPHOS SPECIMEN HEMOLYZED. HEMOLYSIS MAY AFFECT INTEGRITY OF RESULTS. 53 56 65  BILITOT SPECIMEN HEMOLYZED. HEMOLYSIS MAY AFFECT INTEGRITY OF RESULTS. 0.6 0.8 0.4  PROT SPECIMEN HEMOLYZED. HEMOLYSIS MAY AFFECT INTEGRITY OF RESULTS. 6.2* 5.5* 5.6*  ALBUMIN SPECIMEN HEMOLYZED. HEMOLYSIS MAY AFFECT INTEGRITY OF RESULTS. 2.7* 2.4* 2.2*   No results for input(s): LIPASE, AMYLASE in the last 168 hours. No results for input(s): AMMONIA in the last 168  hours. Coagulation profile No results for input(s): INR, PROTIME in the last 168 hours.  CBC: Recent Labs  Lab 11/22/19 1250 11/22/19 2310 11/28/19 1006 11/29/19 0253  WBC 12.6* 10.5 11.3* 8.6  NEUTROABS  --   --  9.6*  --   HGB 11.6* 11.1* 9.8* 11.1*  HCT 35.7* 34.4* 30.7* 34.0*  MCV 89.9 91.0 93.0 90.9  PLT 168 235 260 225   Cardiac Enzymes: No results for input(s): CKTOTAL, CKMB, CKMBINDEX, TROPONINI in the last 168 hours. BNP (last 3 results) No results for input(s): PROBNP in the last 8760 hours. CBG: Recent Labs  Lab 11/28/19 2051 11/29/19 0035 11/29/19 0423 11/29/19 0842 11/29/19 1130  GLUCAP 509* 426* 82 234* 393*   D-Dimer: No results for input(s): DDIMER in the last 72  hours. Hgb A1c: No results for input(s): HGBA1C in the last 72 hours. Lipid Profile: No results for input(s): CHOL, HDL, LDLCALC, TRIG, CHOLHDL, LDLDIRECT in the last 72 hours. Thyroid function studies: No results for input(s): TSH, T4TOTAL, T3FREE, THYROIDAB in the last 72 hours.  Invalid input(s): FREET3 Anemia work up: No results for input(s): VITAMINB12, FOLATE, FERRITIN, TIBC, IRON, RETICCTPCT in the last 72 hours. Sepsis Labs: Recent Labs  Lab 11/22/19 1250 11/22/19 2310 11/28/19 1006 11/29/19 0253  WBC 12.6* 10.5 11.3* 8.6    Microbiology Recent Results (from the past 240 hour(s))  Urine culture     Status: None   Collection Time: 11/22/19  4:00 PM   Specimen: Urine, Random  Result Value Ref Range Status   Specimen Description URINE, RANDOM  Final   Special Requests NONE  Final   Culture   Final    NO GROWTH Performed at [facility], [address], [place], [place] [address]    Report Status 11/23/2019 FINAL  Final  SARS CORONAVIRUS 2 (TAT 6-24 HRS) Nasopharyngeal Nasopharyngeal Swab     Status: None   Collection Time: 11/22/19  4:08 PM   Specimen: Nasopharyngeal Swab  Result Value Ref Range Status   SARS Coronavirus 2 NEGATIVE NEGATIVE Final    Comment:  (NOTE) SARS-CoV-2 target nucleic acids are NOT DETECTED. The SARS-CoV-2 RNA is generally detectable in upper and lower respiratory specimens during the acute phase of infection. Negative results do not preclude SARS-CoV-2 infection, do not rule out co-infections with other pathogens, and should not be used as the sole basis for treatment or other patient management decisions. Negative results must be combined with clinical observations, patient history, and epidemiological information. The expected result is Negative. Fact Sheet for Patients: SugarRoll.be Fact Sheet for Healthcare Providers: https://www.woods-mathews.com/ This test is not yet approved or cleared by the [place] FDA and  has been authorized for detection and/or diagnosis of SARS-CoV-2 by FDA under an Emergency Use Authorization (EUA). This EUA will remain  in effect (meaning this test can be used) for the duration of the COVID-19 declaration under Section 56 4(b)(1) of the Act, 21 U.S.C. section 360bbb-3(b)(1), unless the authorization is terminated or revoked sooner. Performed at  [facility], [place] [address], [place],  [address]     Procedures and diagnostic studies:  CT Head Wo Contrast  Result Date: 11/28/2019 CLINICAL DATA:  Altered mental status EXAM: CT HEAD WITHOUT CONTRAST TECHNIQUE: Contiguous axial images were obtained from the base of the skull through the vertex without intravenous contrast. COMPARISON:  November 22, 2019 FINDINGS: Brain: There is mild diffuse atrophy. There is no intracranial mass, hemorrhage, extra-axial fluid collection, or midline shift. There is patchy small vessel disease in the centra semiovale bilaterally. Brain parenchyma elsewhere appears unremarkable. No acute infarct is demonstrable. Vascular: There is no hyperdense vessel. There is calcification in the distal vertebral arteries and carotid siphon regions bilaterally. Skull:  Bony calvarium appears intact. Sinuses/Orbits: There is mucosal thickening in the posterior right sphenoid sinus as well as in several ethmoid air cells. Orbits appear symmetric bilaterally. Other: Visualized mastoid air cells are clear. There is debris in each external auditory canal. IMPRESSION: Atrophy with periventricular small vessel disease. No acute infarct. No mass or hemorrhage. Appearance stable compared to recent study. There are foci of arterial vascular calcification. There are foci paranasal sinus disease. There is probable cerumen in each external auditory canal. Electronically Signed   By: Lowella Grip III M.D.  On: 11/28/2019 10:57   DG Chest Port 1 View  Result Date: 11/28/2019 CLINICAL DATA:  Hypoglycemia EXAM: PORTABLE CHEST 1 VIEW COMPARISON:  November 22, 2019 FINDINGS: The lungs are clear. Heart is upper normal in size with pulmonary vascularity normal. No adenopathy. No bone lesions. IMPRESSION: Lungs clear.  Heart upper normal in size.  No adenopathy. Electronically Signed   By: Lowella Grip III M.D.   On: 11/28/2019 09:45    Medications:   . amLODipine  10 mg Oral Daily  . aspirin  81 mg Oral Daily  . atorvastatin  10 mg Oral Daily  . FLUoxetine  40 mg Oral Daily  . heparin  5,000 Units Subcutaneous Q8H  . insulin aspart  0-5 Units Subcutaneous TID WC  . [START ON 11/30/2019] insulin aspart protamine- aspart  4 Units Subcutaneous BID WC  . sodium chloride flush  3 mL Intravenous Q12H   Continuous Infusions:   LOS: 0 days   Radene Gunning NP [facility]   How to contact the [facility] Attending or Consulting provider [place] or covering provider during after hours [place], for this patient?  1. Check the care team in [facility] and look for a) attending/consulting TRH provider listed and b) the [facility] team listed 2. Log into [email or web address] and use [place]'s universal password to access. If you do not have the password, please contact the hospital  operator. 3. Locate the [facility] provider you are looking for under [facility] and page to a number that you can be directly reached. 4. If you still have difficulty reaching the provider, please page the [facility] (Director on Call) for the Hospitalists listed on amion for assistance.  11/29/2019, 12:33 PM

## 2019-11-29 NOTE — Plan of Care (Signed)
  Problem: Education: Goal: Knowledge of General Education information will improve Description: Including pain rating scale, medication(s)/side effects and non-pharmacologic comfort measures Outcome: Progressing   Problem: Health Behavior/Discharge Planning: Goal: Ability to manage health-related needs will improve Outcome: Progressing   Problem: Clinical Measurements: Goal: Ability to maintain clinical measurements within normal limits will improve Outcome: Progressing Goal: Will remain free from infection Outcome: Progressing Goal: Diagnostic test results will improve Outcome: Progressing   Problem: Activity: Goal: Risk for activity intolerance will decrease Outcome: Progressing   Problem: Nutrition: Goal: Adequate nutrition will be maintained Outcome: Progressing   Problem: Coping: Goal: Level of anxiety will decrease Outcome: Progressing   Problem: Elimination: Goal: Will not experience complications related to bowel motility Outcome: Progressing Goal: Will not experience complications related to urinary retention Outcome: Progressing   Problem: Safety: Goal: Ability to remain free from injury will improve Outcome: Progressing   Problem: Skin Integrity: Goal: Risk for impaired skin integrity will decrease Outcome: Progressing   

## 2019-11-30 LAB — CBC
HCT: 42.4 % (ref 39.0–52.0)
Hemoglobin: 14.1 g/dL (ref 13.0–17.0)
MCH: 29.7 pg (ref 26.0–34.0)
MCHC: 33.3 g/dL (ref 30.0–36.0)
MCV: 89.5 fL (ref 80.0–100.0)
Platelets: 191 10*3/uL (ref 150–400)
RBC: 4.74 MIL/uL (ref 4.22–5.81)
RDW: 13.6 % (ref 11.5–15.5)
WBC: 6.9 10*3/uL (ref 4.0–10.5)
nRBC: 0 % (ref 0.0–0.2)

## 2019-11-30 LAB — BASIC METABOLIC PANEL
Anion gap: 13 (ref 5–15)
BUN: 46 mg/dL — ABNORMAL HIGH (ref 8–23)
CO2: 19 mmol/L — ABNORMAL LOW (ref 22–32)
Calcium: 8.5 mg/dL — ABNORMAL LOW (ref 8.9–10.3)
Chloride: 102 mmol/L (ref 98–111)
Creatinine, Ser: 2.41 mg/dL — ABNORMAL HIGH (ref 0.61–1.24)
GFR calc Af Amer: 30 mL/min — ABNORMAL LOW (ref >60)
GFR calc non Af Amer: 25 mL/min — ABNORMAL LOW (ref >60)
Glucose, Bld: 559 mg/dL (ref 70–99)
Potassium: 5.4 mmol/L — ABNORMAL HIGH (ref 3.5–5.1)
Sodium: 134 mmol/L — ABNORMAL LOW (ref 135–145)

## 2019-11-30 LAB — GLUCOSE, CAPILLARY
Glucose-Capillary: 527 mg/dL (ref 70–99)
Glucose-Capillary: 452 mg/dL — ABNORMAL HIGH (ref 70–99)
Glucose-Capillary: 440 mg/dL — ABNORMAL HIGH (ref 70–99)
Glucose-Capillary: 413 mg/dL — ABNORMAL HIGH (ref 70–99)
Glucose-Capillary: 159 mg/dL — ABNORMAL HIGH (ref 70–99)
Glucose-Capillary: 168 mg/dL — ABNORMAL HIGH (ref 70–99)

## 2019-11-30 MED ORDER — INSULIN ASPART 100 UNIT/ML ~~LOC~~ SOLN
0.0000 [IU] | Freq: Three times a day (TID) | SUBCUTANEOUS | Status: DC
  Administered 2019-11-30: 6 [IU] via SUBCUTANEOUS
  Administered 2019-11-30: 1 [IU] via SUBCUTANEOUS
  Administered 2019-12-01: 4 [IU] via SUBCUTANEOUS
  Administered 2019-12-01: 2 [IU] via SUBCUTANEOUS
  Administered 2019-12-01: 12:00:00 6 [IU] via SUBCUTANEOUS
  Administered 2019-12-02: 14:00:00 5 [IU] via SUBCUTANEOUS
  Administered 2019-12-02: 09:00:00 1 [IU] via SUBCUTANEOUS
  Administered 2019-12-03: 13:00:00 4 [IU] via SUBCUTANEOUS
  Administered 2019-12-03 – 2019-12-04 (×2): 2 [IU] via SUBCUTANEOUS

## 2019-11-30 MED ORDER — INSULIN ASPART PROT & ASPART (70-30 MIX) 100 UNIT/ML ~~LOC~~ SUSP
7.0000 [IU] | Freq: Two times a day (BID) | SUBCUTANEOUS | Status: DC
  Administered 2019-11-30 – 2019-12-01 (×4): 7 [IU] via SUBCUTANEOUS
  Filled 2019-11-30: qty 10

## 2019-11-30 MED ORDER — HYDRALAZINE HCL 25 MG PO TABS
25.0000 mg | ORAL_TABLET | Freq: Three times a day (TID) | ORAL | Status: DC
  Administered 2019-11-30 – 2019-12-01 (×3): 25 mg via ORAL
  Filled 2019-11-30 (×3): qty 1

## 2019-11-30 MED ORDER — SODIUM CHLORIDE 0.9 % IV SOLN: INTRAVENOUS | Status: DC

## 2019-11-30 NOTE — Progress Notes (Signed)
Progress Note    [name] [name]  [id number] DOB: 08-Aug-1946  DOA: 11/28/2019 PCP: Azzie Glatter, [name]    Brief Narrative:   Chief complaint: acute encephalopathy  Medical records reviewed and are as summarized below:  [name] [name] is an 74 y.o. 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  Discharged 5 days ago after a 3-day hospitalization for same.  He reports his sisters manage his medications.  Assessment/Plan:   Principal Problem:   Hypoglycemia due to insulin Active Problems:   Acute kidney injury superimposed on chronic kidney disease ([name])   Acute metabolic encephalopathy   History of cerebrovascular accident (CVA) in adulthood   Normocytic anemia   Chronic diastolic CHF (congestive heart failure) ([name])   Cognitive decline   Hypoglycemia   Acute encephalopathy secondary to hypoglycemia due to insulin.  - Patient discharged 5 days ago with same. Has cognitive impairment and sisters assit with insulin.   - chart review indicates sister reports patient not taking Glucotrol since his discharge last week and his blood sugars became low.  -Discontinue D10 -Patient seems to have wild swings in his blood sugar as he was greater than 500 this a.m. -Will adjust 70/30 and sliding scale insulin with careful attention to hypoglycemia -Diabetic coordinator suggest a 2 AM CBG which has been ordered   Acute kidney injury superimposed on chronic kidney disease stage III.  Creatinine 2.39 on admission.  This appears to be above baseline. -Encourage p.o. water intake   Hypertension.  -Home medication resumed -There is to be continually elevated so we will add second agent (avoid blocker due to bradycardia and ACE inhibitor due to hyperkalemia and CKD)   History of CVA. -Continue home aspirin and statin     Family  Communication/Anticipated D/C date and plan/Code Status   DVT prophylaxis: heparin Code Status: Full Code.  Family Communication: sister Disposition Plan: home when ready   Medical Consultants:    Diabetes coordinator   Anti-Infectives:    None  Subjective:   Awake alert oriented to time and place.  Watching TV.  Denies any pain or discomfort  Objective:    Vitals:   11/29/19 1944 11/29/19 2144 11/30/19 0441 11/30/19 0741  BP: (!) 156/84  (!) 186/86 (!) 176/95  Pulse: (!) 52  77 80  Resp: 16  16 18   Temp: 98.6 F (37 C)  98.5 F (36.9 C) 98.1 F (36.7 C)  TempSrc: Oral  Oral Oral  SpO2: 100%  95% 97%  Weight:  68.7 kg    Height:  5' 9.5" (1.765 m)      Intake/Output Summary (Last 24 hours) at 11/30/2019 1208 Last data filed at 11/30/2019 0900 Gross per 24 hour  Intake 480 ml  Output 2650 ml  Net -2170 ml   Filed Weights   11/29/19 2144  Weight: 68.7 kg    Exam: On side of bed Pleasant cooperative Regular rate and rhythm No increased work of breathing  Data Reviewed:   I have personally reviewed following labs and imaging studies:  Labs: Labs show the following:   Basic Metabolic Panel: Recent Labs  Lab 11/28/19 1006 11/28/19 1006 11/29/19 0253 11/30/19 0551  NA 138  --  137 134*  K 3.7   < > 3.8 5.4*  CL 106  --  104 102  CO2 22  --  21* 19*  GLUCOSE 223*  --  179* 559*  BUN 37*  --  44* 46*  CREATININE 2.39*  --  2.48* 2.41*  CALCIUM 8.2*  --  8.2* 8.5*   < > = values in this interval not displayed.   GFR Estimated Creatinine Clearance: 26.1 mL/min (A) (by C-G formula based on SCr of 2.41 mg/dL (H)). Liver Function Tests: Recent Labs  Lab 11/28/19 1006  AST 36  ALT 23  ALKPHOS 65  BILITOT 0.4  PROT 5.6*  ALBUMIN 2.2*   No results for input(s): LIPASE, AMYLASE in the last 168 hours. No results for input(s): AMMONIA in the last 168 hours. Coagulation profile No results for input(s): INR, PROTIME in the last 168  hours.  CBC: Recent Labs  Lab 11/28/19 1006 11/29/19 0253 11/30/19 0658  WBC 11.3* 8.6 6.9  NEUTROABS 9.6*  --   --   HGB 9.8* 11.1* 14.1  HCT 30.7* 34.0* 42.4  MCV 93.0 90.9 89.5  PLT 260 225 191   Cardiac Enzymes: No results for input(s): CKTOTAL, CKMB, CKMBINDEX, TROPONINI in the last 168 hours. BNP (last 3 results) No results for input(s): PROBNP in the last 8760 hours. CBG: Recent Labs  Lab 11/29/19 2053 11/30/19 0652 11/30/19 0847 11/30/19 1042 11/30/19 1157  GLUCAP 255* 527* 452* 440* 413*   D-Dimer: No results for input(s): DDIMER in the last 72 hours. Hgb A1c: No results for input(s): HGBA1C in the last 72 hours. Lipid Profile: No results for input(s): CHOL, HDL, LDLCALC, TRIG, CHOLHDL, LDLDIRECT in the last 72 hours. Thyroid function studies: No results for input(s): TSH, T4TOTAL, T3FREE, THYROIDAB in the last 72 hours.  Invalid input(s): FREET3 Anemia work up: No results for input(s): VITAMINB12, FOLATE, FERRITIN, TIBC, IRON, RETICCTPCT in the last 72 hours. Sepsis Labs: Recent Labs  Lab 11/28/19 1006 11/29/19 0253 11/30/19 0658  WBC 11.3* 8.6 6.9    Microbiology Recent Results (from the past 240 hour(s))  Urine culture     Status: None   Collection Time: 11/22/19  4:00 PM   Specimen: Urine, Random  Result Value Ref Range Status   Specimen Description URINE, RANDOM  Final   Special Requests NONE  Final   Culture   Final    NO GROWTH Performed at [facility], [address] [place], [name] [address]    Report Status 11/23/2019 FINAL  Final  SARS CORONAVIRUS 2 (TAT 6-24 HRS) Nasopharyngeal Nasopharyngeal Swab     Status: None   Collection Time: 11/22/19  4:08 PM   Specimen: Nasopharyngeal Swab  Result Value Ref Range Status   SARS Coronavirus 2 NEGATIVE NEGATIVE Final    Comment: (NOTE) SARS-CoV-2 target nucleic acids are NOT DETECTED. The SARS-CoV-2 RNA is generally detectable in upper and lower respiratory specimens during the  acute phase of infection. Negative results do not preclude SARS-CoV-2 infection, do not rule out co-infections with other pathogens, and should not be used as the sole basis for treatment or other patient management decisions. Negative results must be combined with clinical observations, patient history, and epidemiological information. The expected result is Negative. Fact Sheet for Patients: SugarRoll.be Fact Sheet for Healthcare Providers: https://www.woods-mathews.com/ This test is not yet approved or cleared by the [place] FDA and  has been authorized for detection and/or diagnosis of SARS-CoV-2 by FDA under an Emergency Use Authorization (EUA). This EUA will remain  in effect (meaning this test can be used) for the duration of the COVID-19 declaration under Section 56 4(b)(1) of the  Act, 21 U.S.C. section 360bbb-3(b)(1), unless the authorization is terminated or revoked sooner. Performed at [facility], [place] [address], [place], [place] [address]     Procedures and diagnostic studies:  No results found.  Medications:   . amLODipine  10 mg Oral Daily  . aspirin  81 mg Oral Daily  . atorvastatin  10 mg Oral Daily  . FLUoxetine  40 mg Oral Daily  . heparin  5,000 Units Subcutaneous Q8H  . insulin aspart  0-6 Units Subcutaneous TID WC  . insulin aspart protamine- aspart  7 Units Subcutaneous BID WC  . sodium chloride flush  3 mL Intravenous Q12H   Continuous Infusions:   LOS: 1 day   [name] DO [facility]   How to contact the [facility] Attending or Consulting provider [place] or covering provider during after hours [place], for this patient?  1. Check the care team in [facility] and look for a) attending/consulting TRH provider listed and b) the [facility] team listed 2. Log into [email or web address] and use [place]'s universal password to access. If you do not have the password, please contact the hospital  operator. 3. Locate the [facility] provider you are looking for under [facility] and page to a number that you can be directly reached. 4. If you still have difficulty reaching the provider, please page the [facility] (Director on Call) for the Hospitalists listed on amion for assistance.  11/30/2019, 12:08 PM

## 2019-11-30 NOTE — Progress Notes (Signed)
Inpatient Diabetes Program Recommendations  AACE/ADA: New Consensus Statement on Inpatient Glycemic Control (2015)  Target Ranges:  Prepandial:   less than 140 mg/dL      Peak postprandial:   less than 180 mg/dL (1-2 hours)      Critically ill patients:  140 - 180 mg/dL   Lab Results  Component Value Date   GLUCAP 452 (H) 11/30/2019   HGBA1C 10.7 (H) 11/23/2019    Review of Glycemic Control Results for TYREC, WRIDE (MRN [id number]) as of 11/30/2019 10:26  Ref. Range 11/29/2019 20:53 11/30/2019 06:52 11/30/2019 08:47  Glucose-Capillary Latest Ref Range: 70 - 99 mg/dL 255 (H) 527 (HH) 452 (H)   Diabetes history: DM2 Outpatient Diabetes medications: Lantus 10-12 units q am + Novolog 70/30 insulin mix 4-6 units ac supper Current orders for Inpatient glycemic control: Novolog 70/30 7 units BID, Novolog 0-5 units custom TID  Inpatient Diabetes Program Recommendations:    Noted insulin adjustment and AM CBG of > 500 mg/dL.  Consider adding 0200 CBG.  Following.   Thanks, Bronson Curb, MSN, RNC-OB Diabetes Coordinator [phone] (8a-5p)

## 2019-11-30 NOTE — Progress Notes (Signed)
Lab notified nurse of a blood sugar of 559  On call MD notified

## 2019-12-01 LAB — GLUCOSE, CAPILLARY
Glucose-Capillary: 101 mg/dL — ABNORMAL HIGH (ref 70–99)
Glucose-Capillary: 213 mg/dL — ABNORMAL HIGH (ref 70–99)
Glucose-Capillary: 455 mg/dL — ABNORMAL HIGH (ref 70–99)
Glucose-Capillary: 309 mg/dL — ABNORMAL HIGH (ref 70–99)
Glucose-Capillary: 244 mg/dL — ABNORMAL HIGH (ref 70–99)

## 2019-12-01 MED ORDER — HYDRALAZINE HCL 50 MG PO TABS
50.0000 mg | ORAL_TABLET | Freq: Three times a day (TID) | ORAL | Status: DC
  Administered 2019-12-01 – 2019-12-04 (×10): 50 mg via ORAL
  Filled 2019-12-01 (×10): qty 1

## 2019-12-01 NOTE — Progress Notes (Signed)
Inpatient Diabetes Program Recommendations  AACE/ADA: New Consensus Statement on Inpatient Glycemic Control (2015)  Target Ranges:  Prepandial:   less than 140 mg/dL      Peak postprandial:   less than 180 mg/dL (1-2 hours)      Critically ill patients:  140 - 180 mg/dL   Lab Results  Component Value Date   GLUCAP 455 (H) 12/01/2019   HGBA1C 10.7 (H) 11/23/2019    Review of Glycemic Control Results for [name] [name], [name] [name] (MRN [id number]) as of 12/01/2019 13:15  Ref. Range 11/30/2019 20:38 12/01/2019 01:59 12/01/2019 06:36 12/01/2019 11:31  Glucose-Capillary Latest Ref Range: 70 - 99 mg/dL 168 (H) 101 (H) 213 (H) 455 (H)   Diabetes history:DM2 Outpatient Diabetes medications:Lantus 10-12 units q am + Novolog 70/30 insulin mix 4-6 units ac supper Current orders for Inpatient glycemic control: Novolog 70/30 7 units BID, Novolog 0-6 units TID  Inpatient Diabetes Program Recommendations:  AM glucose trends were beginning to trend more appropriately. Lunch time CBG elevated to 455 mg/dL, however dose of Novolog 70/30 was delayed.  In agreement with current orders, will follow.   Thanks, Bronson Curb, MSN, RNC-OB Diabetes Coordinator [phone] (8a-5p)

## 2019-12-01 NOTE — Progress Notes (Signed)
Progress Note    [name] [name]  [id number] DOB: May 12, 1946  DOA: 11/28/2019 PCP: Azzie Glatter, [name]    Brief Narrative:   Chief complaint: acute encephalopathy  Medical records reviewed and are as summarized below:  [name] is an 74 y.o. 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  Discharged 5 days ago after a 3-day hospitalization for same.  He reports his sisters manage his medications.  Assessment/Plan:   Principal Problem:   Hypoglycemia due to insulin Active Problems:   Acute kidney injury superimposed on chronic kidney disease ([name])   Acute metabolic encephalopathy   History of cerebrovascular accident (CVA) in adulthood   Normocytic anemia   Chronic diastolic CHF (congestive heart failure) ([name])   Cognitive decline   Hypoglycemia   Acute encephalopathy secondary to hypoglycemia due to insulin.  - Patient discharged 5 days ago with same. Has cognitive impairment and sisters assit with insulin.   - chart review indicates sister reports patient not taking Glucotrol since his discharge last week and his blood sugars became low.  -Discontinued D10 -Patient seems to have wild swings in his blood sugar as he was greater than 500 yesterday. -Will adjust 70/30 and sliding scale insulin with careful attention to hypoglycemia -Diabetic coordinator suggest a 2 AM CBG: Was 101   Acute kidney injury superimposed on chronic kidney disease stage III.  Creatinine 2.39 on admission.  This appears to be above baseline. -Encourage p.o. water intake   Hypertension.  -Home medication resumed -There is to be continually elevated so we will add second agent (avoid blocker due to bradycardia and ACE inhibitor due to hyperkalemia and CKD)   History of CVA. -Continue home aspirin and statin     Family  Communication/Anticipated D/C date and plan/Code Status   DVT prophylaxis: heparin Code Status: Full Code.  Family Communication: sister Disposition Plan: home once plan for insulin has been finalized, hope for the a.m.   Medical Consultants:    Diabetes coordinator     Subjective:   No current complaints, patient was told by his PCP to take his 70/30 every 8 hours  Objective:    Vitals:   12/01/19 0418 12/01/19 0641 12/01/19 0907 12/01/19 1027  BP: (!) 177/79 (!) 181/87 (!) 178/94 (!) 178/94  Pulse: 78 80 73   Resp: 15 17 16    Temp: 98.1 F (36.7 C)  98.2 F (36.8 C)   TempSrc:   Oral   SpO2: 100% 98% 97%   Weight:      Height:        Intake/Output Summary (Last 24 hours) at 12/01/2019 1116 Last data filed at 12/01/2019 0900 Gross per 24 hour  Intake 1793.07 ml  Output 2550 ml  Net -756.93 ml   Filed Weights   11/29/19 2144  Weight: 68.7 kg    Exam: Eating breakfast, no acute distress Regular rate and rhythm No increased work of breathing Alert, pleasant and cooperative  Data Reviewed:   I have personally reviewed following labs and imaging studies:  Labs: Labs show the following:   Basic Metabolic Panel: Recent Labs  Lab 11/28/19 1006 11/28/19 1006 11/29/19 0253 11/30/19 0551  NA 138  --  137 134*  K 3.7   < > 3.8 5.4*  CL 106  --  104 102  CO2 22  --  21* 19*  GLUCOSE 223*  --  179* 559*  BUN 37*  --  44* 46*  CREATININE 2.39*  --  2.48* 2.41*  CALCIUM 8.2*  --  8.2* 8.5*   < > = values in this interval not displayed.   GFR Estimated Creatinine Clearance: 26.1 mL/min (A) (by C-G formula based on SCr of 2.41 mg/dL (H)). Liver Function Tests: Recent Labs  Lab 11/28/19 1006  AST 36  ALT 23  ALKPHOS 65  BILITOT 0.4  PROT 5.6*  ALBUMIN 2.2*   No results for input(s): LIPASE, AMYLASE in the last 168 hours. No results for input(s): AMMONIA in the last 168 hours. Coagulation profile No results for input(s): INR, PROTIME in the  last 168 hours.  CBC: Recent Labs  Lab 11/28/19 1006 11/29/19 0253 11/30/19 0658  WBC 11.3* 8.6 6.9  NEUTROABS 9.6*  --   --   HGB 9.8* 11.1* 14.1  HCT 30.7* 34.0* 42.4  MCV 93.0 90.9 89.5  PLT 260 225 191   Cardiac Enzymes: No results for input(s): CKTOTAL, CKMB, CKMBINDEX, TROPONINI in the last 168 hours. BNP (last 3 results) No results for input(s): PROBNP in the last 8760 hours. CBG: Recent Labs  Lab 11/30/19 1157 11/30/19 1637 11/30/19 2038 12/01/19 0159 12/01/19 0636  GLUCAP 413* 159* 168* 101* 213*   D-Dimer: No results for input(s): DDIMER in the last 72 hours. Hgb A1c: No results for input(s): HGBA1C in the last 72 hours. Lipid Profile: No results for input(s): CHOL, HDL, LDLCALC, TRIG, CHOLHDL, LDLDIRECT in the last 72 hours. Thyroid function studies: No results for input(s): TSH, T4TOTAL, T3FREE, THYROIDAB in the last 72 hours.  Invalid input(s): FREET3 Anemia work up: No results for input(s): VITAMINB12, FOLATE, FERRITIN, TIBC, IRON, RETICCTPCT in the last 72 hours. Sepsis Labs: Recent Labs  Lab 11/28/19 1006 11/29/19 0253 11/30/19 0658  WBC 11.3* 8.6 6.9    Microbiology Recent Results (from the past 240 hour(s))  Urine culture     Status: None   Collection Time: 11/22/19  4:00 PM   Specimen: Urine, Random  Result Value Ref Range Status   Specimen Description URINE, RANDOM  Final   Special Requests NONE  Final   Culture   Final    NO GROWTH Performed at [facility], [address], [place], [place] [address]    Report Status 11/23/2019 FINAL  Final  SARS CORONAVIRUS 2 (TAT 6-24 HRS) Nasopharyngeal Nasopharyngeal Swab     Status: None   Collection Time: 11/22/19  4:08 PM   Specimen: Nasopharyngeal Swab  Result Value Ref Range Status   SARS Coronavirus 2 NEGATIVE NEGATIVE Final    Comment: (NOTE) SARS-CoV-2 target nucleic acids are NOT DETECTED. The SARS-CoV-2 RNA is generally detectable in upper and lower respiratory specimens  during the acute phase of infection. Negative results do not preclude SARS-CoV-2 infection, do not rule out co-infections with other pathogens, and should not be used as the sole basis for treatment or other patient management decisions. Negative results must be combined with clinical observations, patient history, and epidemiological information. The expected result is Negative. Fact Sheet for Patients: SugarRoll.be Fact Sheet for Healthcare Providers: https://www.woods-mathews.com/ This test is not yet approved or cleared by the [place] FDA and  has been authorized for detection and/or diagnosis of SARS-CoV-2 by FDA under an Emergency Use Authorization (EUA). This EUA will remain  in effect (meaning this test can be used) for the duration of the COVID-19 declaration under Section 56 4(b)(1) of the Act, 21 U.S.C. section 360bbb-3(b)(1),  unless the authorization is terminated or revoked sooner. Performed at [facility], [place] [address], [place], [place] [address]     Procedures and diagnostic studies:  No results found.  Medications:   . amLODipine  10 mg Oral Daily  . aspirin  81 mg Oral Daily  . atorvastatin  10 mg Oral Daily  . FLUoxetine  40 mg Oral Daily  . heparin  5,000 Units Subcutaneous Q8H  . hydrALAZINE  50 mg Oral Q8H  . insulin aspart  0-6 Units Subcutaneous TID WC  . insulin aspart protamine- aspart  7 Units Subcutaneous BID WC  . sodium chloride flush  3 mL Intravenous Q12H   Continuous Infusions:   LOS: 2 days   [name] DO [facility]   How to contact the [facility] Attending or Consulting provider [place] or covering provider during after hours [place], for this patient?  1. Check the care team in [facility] and look for a) attending/consulting TRH provider listed and b) the [facility] team listed 2. Log into [email or web address] and use [place]'s universal password to access. If you do not have the password,  please contact the hospital operator. 3. Locate the [facility] provider you are looking for under [facility] and page to a number that you can be directly reached. 4. If you still have difficulty reaching the provider, please page the [facility] (Director on Call) for the Hospitalists listed on amion for assistance.  12/01/2019, 11:16 AM

## 2019-12-01 NOTE — Progress Notes (Signed)
I spoke with pharmacy and made them aware that I have not received insulin yet.  They are aware of blood glucose also.

## 2019-12-01 NOTE — Progress Notes (Signed)
Pharmacy has been notified x 2 regarding the need for insulin Novolog 70/30 this am

## 2019-12-01 NOTE — Progress Notes (Signed)
Pt 0400 BP 177/79, schedule hydralazine given at 0530 recheck BP 181/87 @ 0641. Notified MD. Will continue to monitor.

## 2019-12-01 NOTE — Plan of Care (Signed)

## 2019-12-02 LAB — CBC
HCT: 28.9 % — ABNORMAL LOW (ref 39.0–52.0)
Hemoglobin: 9.5 g/dL — ABNORMAL LOW (ref 13.0–17.0)
MCH: 29.8 pg (ref 26.0–34.0)
MCHC: 32.9 g/dL (ref 30.0–36.0)
MCV: 90.6 fL (ref 80.0–100.0)
Platelets: 251 10*3/uL (ref 150–400)
RBC: 3.19 MIL/uL — ABNORMAL LOW (ref 4.22–5.81)
RDW: 13.5 % (ref 11.5–15.5)
WBC: 7.2 10*3/uL (ref 4.0–10.5)
nRBC: 0 % (ref 0.0–0.2)

## 2019-12-02 LAB — BASIC METABOLIC PANEL
Anion gap: 11 (ref 5–15)
BUN: 40 mg/dL — ABNORMAL HIGH (ref 8–23)
CO2: 21 mmol/L — ABNORMAL LOW (ref 22–32)
Calcium: 8 mg/dL — ABNORMAL LOW (ref 8.9–10.3)
Chloride: 108 mmol/L (ref 98–111)
Creatinine, Ser: 2.68 mg/dL — ABNORMAL HIGH (ref 0.61–1.24)
GFR calc Af Amer: 26 mL/min — ABNORMAL LOW (ref >60)
GFR calc non Af Amer: 22 mL/min — ABNORMAL LOW (ref >60)
Glucose, Bld: 90 mg/dL (ref 70–99)
Potassium: 4.2 mmol/L (ref 3.5–5.1)
Sodium: 140 mmol/L (ref 135–145)

## 2019-12-02 LAB — GLUCOSE, CAPILLARY
Glucose-Capillary: 194 mg/dL — ABNORMAL HIGH (ref 70–99)
Glucose-Capillary: 381 mg/dL — ABNORMAL HIGH (ref 70–99)
Glucose-Capillary: 431 mg/dL — ABNORMAL HIGH (ref 70–99)
Glucose-Capillary: 324 mg/dL — ABNORMAL HIGH (ref 70–99)

## 2019-12-02 MED ORDER — INSULIN GLARGINE 100 UNIT/ML ~~LOC~~ SOLN
3.0000 [IU] | Freq: Every day | SUBCUTANEOUS | Status: DC
  Filled 2019-12-02: qty 0.03

## 2019-12-02 MED ORDER — INSULIN GLARGINE 100 UNIT/ML ~~LOC~~ SOLN
10.0000 [IU] | Freq: Every day | SUBCUTANEOUS | Status: DC
  Administered 2019-12-02: 14:00:00 10 [IU] via SUBCUTANEOUS
  Filled 2019-12-02: qty 0.1

## 2019-12-02 MED ORDER — VITAMIN B-12 1000 MCG PO TABS
1000.0000 ug | ORAL_TABLET | Freq: Every day | ORAL | Status: DC
  Administered 2019-12-02 – 2019-12-04 (×3): 1000 ug via ORAL
  Filled 2019-12-02 (×3): qty 1

## 2019-12-02 MED ORDER — SODIUM CHLORIDE 0.9 % IV BOLUS
250.0000 mL | Freq: Once | INTRAVENOUS | Status: AC
  Administered 2019-12-02: 18:00:00 250 mL via INTRAVENOUS

## 2019-12-02 MED ORDER — INSULIN ASPART 100 UNIT/ML ~~LOC~~ SOLN
6.0000 [IU] | Freq: Once | SUBCUTANEOUS | Status: AC
  Administered 2019-12-02: 18:00:00 6 [IU] via SUBCUTANEOUS

## 2019-12-02 MED ORDER — INSULIN GLARGINE 100 UNIT/ML ~~LOC~~ SOLN
10.0000 [IU] | Freq: Every day | SUBCUTANEOUS | Status: DC
  Administered 2019-12-03: 10:00:00 10 [IU] via SUBCUTANEOUS
  Filled 2019-12-02: qty 0.1

## 2019-12-02 NOTE — Progress Notes (Signed)
Inpatient Diabetes Program Recommendations  AACE/ADA: New Consensus Statement on Inpatient Glycemic Control (2015)  Target Ranges:  Prepandial:   less than 140 mg/dL      Peak postprandial:   less than 180 mg/dL (1-2 hours)      Critically ill patients:  140 - 180 mg/dL   Lab Results  Component Value Date   GLUCAP 381 (H) 12/02/2019   HGBA1C 10.7 (H) 11/23/2019    Review of Glycemic Control Results for [name] [name], [name] [name] (MRN [id number]) as of 12/02/2019 13:03  Ref. Range 12/01/2019 16:46 12/01/2019 20:38 12/02/2019 06:40 12/02/2019 11:34  Glucose-Capillary Latest Ref Range: 70 - 99 mg/dL 309 (H) 244 (H) 194 (H) 381 (H)    Outpatient Diabetes medications:Lantus 10-12 units q am + Novolog 70/30 insulin mix 4-6 units ac supper Current orders for Inpatient glycemic control:Lantus 10 units QD, Novolog 0-6 units TID  Inpatient Diabetes Program Recommendations:  -Novolog 70/30 7 units BID  Discussion with Dr Posey Pronto regarding switch back to Lantus 10 units QD, especially with concern for social situation following discharge and recurrent episodes of hypoglycemia.  Reviewed repeated hospitalization for hypoglycemia, previous admissions for DKA (05/2019), inconsistent meal intake at home, sisters providing care and injections twice per day, and there was inconsistentcy on whether sisters were giving a sulfonylurea at supper. Question whether Lantus plus 70/30 dose was contributing to lows, as sisters informed our team that since the previous hospitalization patient was not received sulfonylurea.  Feel that patient will require short acting insulin, which per MD they cannot afford. Thus, presenting challenges with optimal glycemic control.  Priority for this patient is safety and to ensure safe disposition. Not planning to discharge for next 1-2 days. Will watch with insulin change, although anticipate needs at meals. HHRN will be planned at discharge.  Will continue to follow.   Thanks, Bronson Curb, MSN, RNC-OB Diabetes Coordinator [phone] (8a-5p)

## 2019-12-02 NOTE — Progress Notes (Signed)
Progress Note    [name] [name]  [id number] DOB: [birth year]  DOA: 11/28/2019 PCP: Azzie Glatter, [name]    Brief Narrative:   Chief complaint: acute encephalopathy  Medical records reviewed and are as summarized below:  [name] [name] is an 74 y.o. 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  Discharged 5 days ago after a 3-day hospitalization for same.   Discussed with Sister Heron Corfield who states they were giving him Lantus in the morning and insulin 70/30 in the evening depending on his blood sugars.  Per sister, he does not always eat consistently and sometimes they find have eaten meals when they return to his home.  He currently does not have a PCP for follow-up.  His daughter who used to help him with medical staff recently passed away and since then his sisters have taken over his care.  His sisters also care for an elderly mother and a brother with Down syndrome.  They are willing to help him with insulin appointments.  Also would appreciate having home health with RN services.  Remains inpatient for insulin dose adjustment given 2 recent admissions for hypoglycemia.  Assessment/Plan:   Principal Problem:   Hypoglycemia due to insulin Active Problems:   Acute kidney injury superimposed on chronic kidney disease ([name])   Acute metabolic encephalopathy   History of cerebrovascular accident (CVA) in adulthood   Normocytic anemia   Chronic diastolic CHF (congestive heart failure) ([name])   Cognitive decline   Hypoglycemia   Acute encephalopathy secondary to hypoglycemia due to insulin - Patient discharged 5 days ago with same -He has cognitive issues and his sisters given insulin.  It is unclear if he was receiving Glucotrol or not. -Discontinued D10 -Very labile blood sugars -Given his advanced CKD, inconsistent oral intake  and baseline cognitive issues-insulin 70/30 would not be a good option given risk of hypoglycemia.  Will discontinue insulin 70/30. -Start insulin Lantus 10 units in the morning, 3 units at bedtime.  Follow blood glucose and adjust insulin dose as needed.  Per sister he was on mealtime insulin but this was not covered by his insurance and they were unable to afford this. -He will benefit from having close follow-up with PCP and addition of DPP 4 inhibitor or newer antidiabetics as renal function allows and insurance covers.   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  Creatinine 2.39 on admission.  This appears to be above baseline. -Encourage p.o. water intake -Renally dose all medications, avoid nephrotoxic drugs -Will get BMP tomorrow morning   Hypertension.  -Home medication resumed -Continue home amlodipine.  Hydralazine added during this hospitalization   History of CVA. -Continue home aspirin and statin    Family Communication/Anticipated D/C date and plan/Code Status   DVT prophylaxis: heparin Code Status: Full Code.  Family Communication: sister Disposition Plan: Tentative plan for discharge home in 1 to 2 days with home health RN services   Medical Consultants:    Diabetes coordinator   Subjective:   No complaints.  Lives alone.  Sisters visited every day to give him the insulin and his medications.  Objective:    Vitals:   12/02/19 0347 12/02/19 0549 12/02/19 0817 12/02/19 1700  BP: (!) 148/87 (!) 167/83 (!) 151/87 (!) 108/57  Pulse: 75  80 78  Resp: 17  20 17   Temp: 98.8 F (  37.1 C)  99.5 F (37.5 C) 98.9 F (37.2 C)  TempSrc: Oral  Oral Oral  SpO2: 99%  95% 100%  Weight:      Height:        Intake/Output Summary (Last 24 hours) at 12/02/2019 1723 Last data filed at 12/02/2019 1431 Gross per 24 hour  Intake --  Output 2000 ml  Net -2000 ml   Filed Weights   11/29/19 2144  Weight: 68.7 kg    Exam: Eating  breakfast, no acute distress Regular rate and rhythm No increased work of breathing Alert, pleasant and cooperative  Data Reviewed:   I have personally reviewed following labs and imaging studies:  Labs: Labs show the following:   Basic Metabolic Panel: Recent Labs  Lab 11/28/19 1006 11/28/19 1006 11/29/19 0253 11/29/19 0253 11/30/19 0551 12/02/19 0420  NA 138  --  137  --  134* 140  K 3.7   < > 3.8   < > 5.4* 4.2  CL 106  --  104  --  102 108  CO2 22  --  21*  --  19* 21*  GLUCOSE 223*  --  179*  --  559* 90  BUN 37*  --  44*  --  46* 40*  CREATININE 2.39*  --  2.48*  --  2.41* 2.68*  CALCIUM 8.2*  --  8.2*  --  8.5* 8.0*   < > = values in this interval not displayed.   GFR Estimated Creatinine Clearance: 23.5 mL/min (A) (by C-G formula based on SCr of 2.68 mg/dL (H)). Liver Function Tests: Recent Labs  Lab 11/28/19 1006  AST 36  ALT 23  ALKPHOS 65  BILITOT 0.4  PROT 5.6*  ALBUMIN 2.2*   No results for input(s): LIPASE, AMYLASE in the last 168 hours. No results for input(s): AMMONIA in the last 168 hours. Coagulation profile No results for input(s): INR, PROTIME in the last 168 hours.  CBC: Recent Labs  Lab 11/28/19 1006 11/29/19 0253 11/30/19 0658 12/02/19 0745  WBC 11.3* 8.6 6.9 7.2  NEUTROABS 9.6*  --   --   --   HGB 9.8* 11.1* 14.1 9.5*  HCT 30.7* 34.0* 42.4 28.9*  MCV 93.0 90.9 89.5 90.6  PLT 260 225 191 251   Cardiac Enzymes: No results for input(s): CKTOTAL, CKMB, CKMBINDEX, TROPONINI in the last 168 hours. BNP (last 3 results) No results for input(s): PROBNP in the last 8760 hours. CBG: Recent Labs  Lab 12/01/19 1646 12/01/19 2038 12/02/19 0640 12/02/19 1134 12/02/19 1653  GLUCAP 309* 244* 194* 381* 431*   D-Dimer: No results for input(s): DDIMER in the last 72 hours. Hgb A1c: No results for input(s): HGBA1C in the last 72 hours. Lipid Profile: No results for input(s): CHOL, HDL, LDLCALC, TRIG, CHOLHDL, LDLDIRECT in the last  72 hours. Thyroid function studies: No results for input(s): TSH, T4TOTAL, T3FREE, THYROIDAB in the last 72 hours.  Invalid input(s): FREET3 Anemia work up: No results for input(s): VITAMINB12, FOLATE, FERRITIN, TIBC, IRON, RETICCTPCT in the last 72 hours. Sepsis Labs: Recent Labs  Lab 11/28/19 1006 11/29/19 0253 11/30/19 0658 12/02/19 0745  WBC 11.3* 8.6 6.9 7.2    Microbiology No results found for this or any previous visit (from the past 240 hour(s)).  Procedures and diagnostic studies:  No results found.  Medications:   . amLODipine  10 mg Oral Daily  . aspirin  81 mg Oral Daily  . atorvastatin  10 mg Oral Daily  .  FLUoxetine  40 mg Oral Daily  . heparin  5,000 Units Subcutaneous Q8H  . hydrALAZINE  50 mg Oral Q8H  . insulin aspart  0-6 Units Subcutaneous TID WC  . insulin aspart  6 Units Subcutaneous Once  . [START ON 12/03/2019] insulin glargine  10 Units Subcutaneous Daily   And  . [START ON 12/03/2019] insulin glargine  3 Units Subcutaneous QHS  . sodium chloride flush  3 mL Intravenous Q12H  . vitamin B-12  1,000 mcg Oral Daily   Continuous Infusions:   LOS: 3 days   [name] MD [facility]   How to contact the [facility] Attending or Consulting provider [place] or covering provider during after hours [place], for this patient?  1. Check the care team in [facility] and look for a) attending/consulting TRH provider listed and b) the [facility] team listed 2. Log into [email or web address] and use [place]'s universal password to access. If you do not have the password, please contact the hospital operator. 3. Locate the [facility] provider you are looking for under [facility] and page to a number that you can be directly reached. 4. If you still have difficulty reaching the provider, please page the [facility] (Director on Call) for the Hospitalists listed on amion for assistance.  12/02/2019, 5:23 PM

## 2019-12-02 NOTE — Plan of Care (Signed)

## 2019-12-03 LAB — BASIC METABOLIC PANEL
Anion gap: 9 (ref 5–15)
BUN: 36 mg/dL — ABNORMAL HIGH (ref 8–23)
CO2: 23 mmol/L (ref 22–32)
Calcium: 8.3 mg/dL — ABNORMAL LOW (ref 8.9–10.3)
Chloride: 106 mmol/L (ref 98–111)
Creatinine, Ser: 2.7 mg/dL — ABNORMAL HIGH (ref 0.61–1.24)
GFR calc Af Amer: 26 mL/min — ABNORMAL LOW (ref >60)
GFR calc non Af Amer: 22 mL/min — ABNORMAL LOW (ref >60)
Glucose, Bld: 147 mg/dL — ABNORMAL HIGH (ref 70–99)
Potassium: 4.1 mmol/L (ref 3.5–5.1)
Sodium: 138 mmol/L (ref 135–145)

## 2019-12-03 LAB — GLUCOSE, CAPILLARY
Glucose-Capillary: 123 mg/dL — ABNORMAL HIGH (ref 70–99)
Glucose-Capillary: 347 mg/dL — ABNORMAL HIGH (ref 70–99)
Glucose-Capillary: 206 mg/dL — ABNORMAL HIGH (ref 70–99)
Glucose-Capillary: 129 mg/dL — ABNORMAL HIGH (ref 70–99)

## 2019-12-03 MED ORDER — INSULIN GLARGINE 100 UNIT/ML ~~LOC~~ SOLN
3.0000 [IU] | Freq: Every day | SUBCUTANEOUS | Status: DC
  Administered 2019-12-03: 23:00:00 3 [IU] via SUBCUTANEOUS
  Filled 2019-12-03 (×2): qty 0.03

## 2019-12-03 MED ORDER — INSULIN GLARGINE 100 UNIT/ML ~~LOC~~ SOLN
10.0000 [IU] | Freq: Every day | SUBCUTANEOUS | Status: DC
  Administered 2019-12-04: 10 [IU] via SUBCUTANEOUS
  Filled 2019-12-03 (×2): qty 0.1

## 2019-12-03 MED ORDER — FUROSEMIDE 20 MG PO TABS
20.0000 mg | ORAL_TABLET | Freq: Every day | ORAL | Status: DC
  Administered 2019-12-03 – 2019-12-04 (×2): 20 mg via ORAL
  Filled 2019-12-03 (×2): qty 1

## 2019-12-03 NOTE — Plan of Care (Signed)
  Problem: Activity: Goal: Risk for activity intolerance will decrease Outcome: Progressing   Problem: Nutrition: Goal: Adequate nutrition will be maintained Outcome: Progressing   Problem: Coping: Goal: Level of anxiety will decrease Outcome: Progressing   Problem: Pain Managment: Goal: General experience of comfort will improve Outcome: Progressing   Problem: Safety: Goal: Ability to remain free from injury will improve Outcome: Progressing   

## 2019-12-03 NOTE — Progress Notes (Signed)
Progress Note    [name] [name]  [id number] DOB: [birth year]  DOA: 11/28/2019 PCP: Azzie Glatter, [name]    Brief Narrative:   Chief complaint: acute encephalopathy  Medical records reviewed and are as summarized below:  [name] [name] is an 74 y.o. 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  Discharged 5 days ago after a 3-day hospitalization for same.   Discussed with Sister Shmar Fochs who states they were giving him Lantus in the morning and insulin 70/30 in the evening depending on his blood sugars.  Per sister, he does not always eat consistently and sometimes they find have eaten meals when they return to his home.  He currently does not have a PCP for follow-up.  His daughter who used to help him with medical staff recently passed away and since then his sisters have taken over his care.  His sisters also care for an elderly mother and a brother with Down syndrome.  They are willing to help him with insulin appointments.  Also would appreciate having home health with RN services.  Remains inpatient for insulin dose adjustment given 2 recent admissions for hypoglycemia.  Assessment/Plan:   Principal Problem:   Hypoglycemia due to insulin Active Problems:   Acute kidney injury superimposed on chronic kidney disease ([name])   Acute metabolic encephalopathy   History of cerebrovascular accident (CVA) in adulthood   Normocytic anemia   Chronic diastolic CHF (congestive heart failure) ([name])   Cognitive decline   Hypoglycemia   Acute encephalopathy secondary to hypoglycemia due to insulin - Patient discharged 5 days ago with same -He has cognitive issues and his sisters give him insulin.  It is unclear if he was receiving Glucotrol or not. -Very labile blood sugars -Given his advanced CKD, inconsistent oral intake and baseline  cognitive issues-insulin 70/30 would not be a good option given risk of hypoglycemia.  Will discontinue insulin 70/30. -Start insulin Lantus 10 units in the morning (change time of administration from 10 AM to 8 AM), 3 units at bedtime.  Follow blood glucose and adjust insulin dose as needed.  Per sister he was on mealtime insulin but this was not covered by his insurance and they were unable to afford this. -He will benefit from having close follow-up with PCP (sister dissatisfied with current PCP, TOC consult placed to provide with alternative options) and addition of DPP 4 inhibitor or newer antidiabetics as renal function allows and insurance covers.   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  Creatinine 2.39 on admission.  This appears to be above baseline.  Improved since admission but now uptrending.  Worsened with gentle hydration.  Will start low-dose diuretics. -Renally dose all medications, avoid nephrotoxic drugs -Will get BMP tomorrow morning   Hypertension.  -Home medication resumed -Continue home amlodipine.  Hydralazine added during this hospitalization   History of CVA. -Continue home aspirin and statin    Family Communication/Anticipated D/C date and plan/Code Status   DVT prophylaxis: heparin Thomson Code Status: Full Code.  Family Communication:  Disposition Plan: Tentative plan for discharge home in 1 to 2 days with home health RN services.  Will need close follow-up with PCP, extensive education to prevent rehospitalization for similar issues.   Medical Consultants:    Diabetes coordinator   Subjective:   No complaints.  Asking when he can go home.  Plan for discharge tomorrow.  He is agreeable with this.  Objective:    Vitals:   12/03/19 0404 12/03/19 0514 12/03/19 0741 12/03/19 1357  BP: (!) 139/91 (!) 164/85 (!) 175/79 (!) 152/76  Pulse: 82  75 82  Resp: 17  17 16   Temp: 98.9 F (37.2 C)  98.9 F (37.2 C) 99.2 F (37.3 C)   TempSrc: Oral  Oral Oral  SpO2: 97%  100% 98%  Weight:      Height:        Intake/Output Summary (Last 24 hours) at 12/03/2019 1734 Last data filed at 12/03/2019 0900 Gross per 24 hour  Intake 603 ml  Output 1950 ml  Net -1347 ml   Filed Weights   11/29/19 2144  Weight: 68.7 kg    Exam: Eating breakfast, no acute distress Regular rate and rhythm No increased work of breathing Alert, pleasant and cooperative  Data Reviewed:   I have personally reviewed following labs and imaging studies:  Labs: Labs show the following:   Basic Metabolic Panel: Recent Labs  Lab 11/28/19 1006 11/28/19 1006 11/29/19 0253 11/29/19 0253 11/30/19 0551 11/30/19 0551 12/02/19 0420 12/03/19 0410  NA 138  --  137  --  134*  --  140 138  K 3.7   < > 3.8   < > 5.4*   < > 4.2 4.1  CL 106  --  104  --  102  --  108 106  CO2 22  --  21*  --  19*  --  21* 23  GLUCOSE 223*  --  179*  --  559*  --  90 147*  BUN 37*  --  44*  --  46*  --  40* 36*  CREATININE 2.39*  --  2.48*  --  2.41*  --  2.68* 2.70*  CALCIUM 8.2*  --  8.2*  --  8.5*  --  8.0* 8.3*   < > = values in this interval not displayed.   GFR Estimated Creatinine Clearance: 23.3 mL/min (A) (by C-G formula based on SCr of 2.7 mg/dL (H)). Liver Function Tests: Recent Labs  Lab 11/28/19 1006  AST 36  ALT 23  ALKPHOS 65  BILITOT 0.4  PROT 5.6*  ALBUMIN 2.2*   No results for input(s): LIPASE, AMYLASE in the last 168 hours. No results for input(s): AMMONIA in the last 168 hours. Coagulation profile No results for input(s): INR, PROTIME in the last 168 hours.  CBC: Recent Labs  Lab 11/28/19 1006 11/29/19 0253 11/30/19 0658 12/02/19 0745  WBC 11.3* 8.6 6.9 7.2  NEUTROABS 9.6*  --   --   --   HGB 9.8* 11.1* 14.1 9.5*  HCT 30.7* 34.0* 42.4 28.9*  MCV 93.0 90.9 89.5 90.6  PLT 260 225 191 251   Cardiac Enzymes: No results for input(s): CKTOTAL, CKMB, CKMBINDEX, TROPONINI in the last 168 hours. BNP (last 3 results) No  results for input(s): PROBNP in the last 8760 hours. CBG: Recent Labs  Lab 12/02/19 1653 12/02/19 1924 12/03/19 0651 12/03/19 1126 12/03/19 1607  GLUCAP 431* 324* 123* 347* 206*   D-Dimer: No results for input(s): DDIMER in the last 72 hours. Hgb A1c: No results for input(s): HGBA1C in the last 72 hours. Lipid Profile: No results for input(s): CHOL, HDL, LDLCALC, TRIG, CHOLHDL, LDLDIRECT in the last 72 hours. Thyroid function studies: No results for input(s): TSH, T4TOTAL, T3FREE, THYROIDAB in the last 72 hours.  Invalid input(s): FREET3 Anemia work up: No  results for input(s): VITAMINB12, FOLATE, FERRITIN, TIBC, IRON, RETICCTPCT in the last 72 hours. Sepsis Labs: Recent Labs  Lab 11/28/19 1006 11/29/19 0253 11/30/19 0658 12/02/19 0745  WBC 11.3* 8.6 6.9 7.2    Microbiology No results found for this or any previous visit (from the past 240 hour(s)).  Procedures and diagnostic studies:  No results found.  Medications:   . amLODipine  10 mg Oral Daily  . aspirin  81 mg Oral Daily  . atorvastatin  10 mg Oral Daily  . FLUoxetine  40 mg Oral Daily  . heparin  5,000 Units Subcutaneous Q8H  . hydrALAZINE  50 mg Oral Q8H  . insulin aspart  0-6 Units Subcutaneous TID WC  . [START ON 12/04/2019] insulin glargine  10 Units Subcutaneous Q breakfast   And  . insulin glargine  3 Units Subcutaneous QHS  . sodium chloride flush  3 mL Intravenous Q12H  . vitamin B-12  1,000 mcg Oral Daily   Continuous Infusions:   LOS: 4 days   [name] MD [facility]   How to contact the [facility] Attending or Consulting provider [place] or covering provider during after hours [place], for this patient?  1. Check the care team in [facility] and look for a) attending/consulting [facility] provider listed and b) the [facility] team listed 2. Log into [email or web address] and use [place]'s universal password to access. If you do not have the password, please contact the hospital operator. 3. Locate the [facility]  provider you are looking for under [facility] and page to a number that you can be directly reached. 4. If you still have difficulty reaching the provider, please page the [facility] (Director on Call) for the Hospitalists listed on amion for assistance.  12/03/2019, 5:34 PM

## 2019-12-04 ENCOUNTER — Encounter (HOSPITAL_COMMUNITY): Payer: Self-pay | Admitting: Internal Medicine

## 2019-12-04 LAB — GLUCOSE, CAPILLARY
Glucose-Capillary: 149 mg/dL — ABNORMAL HIGH (ref 70–99)
Glucose-Capillary: 222 mg/dL — ABNORMAL HIGH (ref 70–99)

## 2019-12-04 MED ORDER — SENNOSIDES-DOCUSATE SODIUM 8.6-50 MG PO TABS
1.0000 | ORAL_TABLET | Freq: Once | ORAL | Status: AC
  Administered 2019-12-04: 1 via ORAL
  Filled 2019-12-04: qty 1

## 2019-12-04 MED ORDER — CYANOCOBALAMIN 1000 MCG PO TABS: 1000.0000 ug | ORAL_TABLET | Freq: Every day | ORAL | 1 refills | Status: DC

## 2019-12-04 MED ORDER — LANTUS SOLOSTAR 100 UNIT/ML ~~LOC~~ SOPN: PEN_INJECTOR | SUBCUTANEOUS | 5 refills | Status: DC

## 2019-12-04 MED ORDER — FUROSEMIDE 20 MG PO TABS: 20.0000 mg | ORAL_TABLET | Freq: Every day | ORAL | 1 refills | Status: DC

## 2019-12-04 MED ORDER — POLYETHYLENE GLYCOL 3350 17 G PO PACK
17.0000 g | PACK | Freq: Once | ORAL | Status: AC
  Administered 2019-12-04: 14:00:00 17 g via ORAL
  Filled 2019-12-04: qty 1

## 2019-12-04 MED ORDER — HYDRALAZINE HCL 50 MG PO TABS: 50.0000 mg | ORAL_TABLET | Freq: Three times a day (TID) | ORAL | 1 refills | Status: DC

## 2019-12-04 MED ORDER — ATORVASTATIN CALCIUM 10 MG PO TABS: 10.0000 mg | ORAL_TABLET | Freq: Every day | ORAL | 3 refills | Status: DC

## 2019-12-04 NOTE — Care Management (Signed)
Patient given [facility] number and information to find a new PCP.

## 2019-12-04 NOTE — Progress Notes (Signed)
Inpatient Diabetes Program Recommendations  AACE/ADA: New Consensus Statement on Inpatient Glycemic Control   Target Ranges:  Prepandial:   less than 140 mg/dL      Peak postprandial:   less than 180 mg/dL (1-2 hours)      Critically ill patients:  140 - 180 mg/dL   Results for LYTLE, MARINACCI (MRN [id number]) as of 12/04/2019 09:38  Ref. Range 12/03/2019 06:51 12/03/2019 11:26 12/03/2019 16:07 12/03/2019 21:21 12/04/2019 06:18  Glucose-Capillary Latest Ref Range: 70 - 99 mg/dL 123 (H) 347 (H) 206 (H) 129 (H) 149 (H)   Review of Glycemic Control  Outpatient Diabetes medications: Lantus 10-12 units q am + Novolog 70/30 insulin mix 4-6 units ac supper Current orders for Inpatient glycemic control:Lantus 10 units QD, Lantus 3 units QHS, Novolog 0-6units TID  Inpatient Diabetes Program Recommendations:   Outpatient DM medications: Noted Dr. Serita Grit note on 12/03/19 that 70/30 insulin not a good option given inconsistent oral intake and risk of hypoglycemia and considering using Lantus and DPP-4 for outpatient DM control. Agreeable to discharging on Lantus and DPP-4 (such as Tradjenta 5 mg daily) and have patient follow up with PCP. [name] [name] has low risk of hypoglycemia and is excreted via feces.  Thanks,  Barnie Alderman, RN, MSN, CDE Diabetes Coordinator Inpatient Diabetes Program [phone] (Team Pager from 8am to 5pm)

## 2019-12-04 NOTE — Plan of Care (Signed)

## 2019-12-04 NOTE — Discharge Summary (Addendum)
Physician Discharge Summary  [name] [name] [id number] DOB: 1946/01/06 DOA: 11/28/2019  PCP: Azzie Glatter, [name]  Admit date: 11/28/2019 Discharge date: 12/04/2019  Admitted From: Home Disposition:  home  Recommendations for Outpatient Follow-up:  1. Needs BMP on follow up. Consider adding ARB/ACEI for proteinuria and/or nephrology referral for advanced CKD. Started on low dose lasix 20 mg.   2. Diabetes medication regimen simplified given cognitive issues, inconsistent po intake, renal dysfunction and recurrent hospitalization for hypoglycemia. Insulin 70/30 discontinued. Discharged on insulin lantus 10 units in the morning with breakfast and 3 units at bedtime. May need dose adjustments. Also recommend starting DPP4 inhibitor Linagliptin 5 mg daily if able to afford as it is excreted in faeces and is not associated with significant hypoglycemia.   3. Hydralazine 50 mg TID added for HTN. Monitor BP and adjust dose as appropriate.   4. Needs close frequent PCP follow-up to prevent recurrent hospitalizations.  Home Health: Yes PT/OT, RN services added for medication assistance Equipment/Devices: None  Discharge Condition: Stable CODE STATUS: Full code Diet recommendation: Diabetic/cardiac diet  Brief/Interim Summary: 74 year old African-American male with PMH of CVA with residual mild cognitive impairment, hypertension, type 2 diabetes mellitus presented with encephalopathy likely secondary to hypoglycemia.  CT head negative for acute abnormality.  Chest x-ray negative.  He was discharged about 5 days ago after a 3-day hospitalization for a similar problem.  He was discharged on insulin Lantus in the morning and insulin 70/30 in the evening.  It is unclear if he was receiving sulfonylureas.  He lives by himself and his sisters who live nearby, come to administer insulin and give him medications.  Sisters note inconsistent meal intake.   On chart review his last visit to his current PCP was  in January 2021.  Sisters dissatisfied with current PCP.  Provided with resources to establish care with another primary care physician.  Education material on prevention of hypoglycemia provided to the patient at the time of discharge. He was noted to have elevated creatinine on admission, likely in setting of worsening CKD due to poorly controlled hypertension and diabetes.  Urine output adequate.  Low-dose Lasix started and will need close follow-up with lab work. Given advanced CKD, cognitive impairments, inconsistent meal pattern, recurrent hypoglycemia requiring hospitalizations-insulin 70/30 was discontinued.  He was continued on insulin Lantus only while in the hospital with relatively better glucose control and no episodes of hypoglycemia.  Discharge Diagnoses:  Principal Problem:   Hypoglycemia due to insulin Active Problems:   Acute kidney injury superimposed on chronic kidney disease ([name])   Acute metabolic encephalopathy   History of cerebrovascular accident (CVA) in adulthood   Normocytic anemia   Chronic diastolic CHF (congestive heart failure) ([place])   Cognitive decline   Hypoglycemia    Discharge Instructions:  Discharge Instructions    Diet - low sodium heart healthy   Complete by: As directed    Diet Carb Modified   Complete by: As directed    Increase activity slowly   Complete by: As directed      Allergies as of 12/04/2019   No Known Allergies     Medication List    STOP taking these medications   insulin lispro 100 UNIT/ML injection Commonly known as: HUMALOG   NovoLOG Mix 70/30 (70-30) 100 UNIT/ML injection Generic drug: insulin aspart protamine- aspart     TAKE these medications   Accu-Chek Guide w/Device Kit 1 Device by Does not apply route QID.   amLODipine 10 MG tablet  Commonly known as: NORVASC TAKE 1 TABLET ONCE DAILY.   aspirin 81 MG chewable tablet Chew 1 tablet (81 mg total) by mouth daily.   atorvastatin 10 MG tablet Commonly  known as: LIPITOR Take 1 tablet (10 mg total) by mouth daily.   B-D ULTRAFINE III SHORT PEN 31G X 8 MM Misc Generic drug: Insulin Pen Needle USE WITH INJECTIONS   cyanocobalamin 1000 MCG tablet Take 1 tablet (1,000 mcg total) by mouth daily. Start taking on: December 05, 2019   FLUoxetine 40 MG capsule Commonly known as: PROZAC Take 1 capsule (40 mg total) by mouth daily.   furosemide 20 MG tablet Commonly known as: LASIX Take 1 tablet (20 mg total) by mouth daily. Start taking on: December 05, 2019   glucose blood test strip Commonly known as: True Metrix Blood Glucose Test CHECK BLOOD SUGAR UP TO 4 TIMES A DAY.   glucose blood test strip Commonly known as: Accu-Chek Guide Use as instructed   Accu-Chek Guide test strip Generic drug: glucose blood USE AS DIRECTED   hydrALAZINE 50 MG tablet Commonly known as: APRESOLINE Take 1 tablet (50 mg total) by mouth every 8 (eight) hours.   Insulin Syringe-Needle U-100 31G X 5/16" 0.3 ML Misc Commonly known as: BD Insulin Syringe U/F Inject 1 each as directed 2 (two) times daily. as directed   Lantus SoloStar 100 UNIT/ML Solostar Pen Generic drug: Insulin Glargine Take 10 units lantus in the morning and 3 units lantus at bedtime. What changed:   how much to take  how to take this  when to take this  additional instructions   Vitamin D (Ergocalciferol) 1.25 MG (50000 UNIT) Caps capsule Commonly known as: DRISDOL Take 1 capsule (50,000 Units total) by mouth once a week.      Follow-up Information    [facility]. Call.   Why: Please call [phone] to be connected with a [facility] provider.  You may also contact your insurance company for a provider in your network.  The number is on your insurance card.          No Known Allergies  Consultations:  Diabetes coordinator   Procedures/Studies: CT Head Wo Contrast  Result Date: 11/28/2019 CLINICAL DATA:  Altered mental status EXAM: CT HEAD WITHOUT  CONTRAST TECHNIQUE: Contiguous axial images were obtained from the base of the skull through the vertex without intravenous contrast. COMPARISON:  November 22, 2019 FINDINGS: Brain: There is mild diffuse atrophy. There is no intracranial mass, hemorrhage, extra-axial fluid collection, or midline shift. There is patchy small vessel disease in the centra semiovale bilaterally. Brain parenchyma elsewhere appears unremarkable. No acute infarct is demonstrable. Vascular: There is no hyperdense vessel. There is calcification in the distal vertebral arteries and carotid siphon regions bilaterally. Skull: Bony calvarium appears intact. Sinuses/Orbits: There is mucosal thickening in the posterior right sphenoid sinus as well as in several ethmoid air cells. Orbits appear symmetric bilaterally. Other: Visualized mastoid air cells are clear. There is debris in each external auditory canal. IMPRESSION: Atrophy with periventricular small vessel disease. No acute infarct. No mass or hemorrhage. Appearance stable compared to recent study. There are foci of arterial vascular calcification. There are foci paranasal sinus disease. There is probable cerumen in each external auditory canal. Electronically Signed   By: Lowella Grip III M.D.   On: 11/28/2019 10:57   CT Head Wo Contrast  Result Date: 11/22/2019 CLINICAL DATA:  Altered mental status. EXAM: CT HEAD WITHOUT CONTRAST TECHNIQUE: Contiguous axial images were  obtained from the base of the skull through the vertex without intravenous contrast. COMPARISON:  January 27, 2019. FINDINGS: Brain: Mild diffuse cortical atrophy is noted. Mild chronic ischemic white matter disease is noted. No mass effect or midline shift is noted. Ventricular size is within normal limits. There is no evidence of mass lesion, hemorrhage or acute infarction. Vascular: No hyperdense vessel or unexpected calcification. Skull: Normal. Negative for fracture or focal lesion. Sinuses/Orbits: No acute  finding. Other: None. IMPRESSION: Mild diffuse cortical atrophy. Mild chronic ischemic white matter disease. No acute intracranial abnormality seen. Electronically Signed   By: Marijo Conception M.D.   On: 11/22/2019 16:34   DG Chest Port 1 View  Result Date: 11/28/2019 CLINICAL DATA:  Hypoglycemia EXAM: PORTABLE CHEST 1 VIEW COMPARISON:  November 22, 2019 FINDINGS: The lungs are clear. Heart is upper normal in size with pulmonary vascularity normal. No adenopathy. No bone lesions. IMPRESSION: Lungs clear.  Heart upper normal in size.  No adenopathy. Electronically Signed   By: Lowella Grip III M.D.   On: 11/28/2019 09:45   DG Chest Portable 1 View  Result Date: 11/22/2019 CLINICAL DATA:  Altered level of consciousness, leukocytosis EXAM: PORTABLE CHEST 1 VIEW COMPARISON:  05/17/2019 FINDINGS: The heart size and mediastinal contours are within normal limits. Both lungs are clear. The visualized skeletal structures are unremarkable. IMPRESSION: No active disease. Electronically Signed   By: Randa Ngo M.D.   On: 11/22/2019 15:13     Subjective: Feels fine.  No new complaints.  Wants to go home.  Discharge Exam: Vitals:   12/04/19 0439 12/04/19 0745  BP: (!) 159/84 (!) 161/83  Pulse: 87 82  Resp: 16 16  Temp: 98.3 F (36.8 C) 98.8 F (37.1 C)  SpO2: 95% 94%   Vitals:   12/03/19 2037 12/03/19 2231 12/04/19 0439 12/04/19 0745  BP: (!) 165/85 (!) 161/94 (!) 159/84 (!) 161/83  Pulse: 77  87 82  Resp: '16  16 16  ' Temp: 98.4 F (36.9 C)  98.3 F (36.8 C) 98.8 F (37.1 C)  TempSrc: Oral  Oral Oral  SpO2: 99%  95% 94%  Weight:      Height:        General: Pt is alert, awake, not in acute distress Cardiovascular: RRR, S1/S2 +, no rubs, no gallops Respiratory:  no wheezing, no rhonchi, no accessory muscle use Abdominal: Soft, NT, ND, bowel sounds + Extremities: no edema, no cyanosis    The results of significant diagnostics from this hospitalization (including imaging,  microbiology, ancillary and laboratory) are listed below for reference.     Microbiology: No results found for this or any previous visit (from the past 240 hour(s)).   Labs: BNP (last 3 results) No results for input(s): BNP in the last 8760 hours. Basic Metabolic Panel: Recent Labs  Lab 11/28/19 1006 11/29/19 0253 11/30/19 0551 12/02/19 0420 12/03/19 0410  NA 138 137 134* 140 138  K 3.7 3.8 5.4* 4.2 4.1  CL 106 104 102 108 106  CO2 22 21* 19* 21* 23  GLUCOSE 223* 179* 559* 90 147*  BUN 37* 44* 46* 40* 36*  CREATININE 2.39* 2.48* 2.41* 2.68* 2.70*  CALCIUM 8.2* 8.2* 8.5* 8.0* 8.3*   Liver Function Tests: Recent Labs  Lab 11/28/19 1006  AST 36  ALT 23  ALKPHOS 65  BILITOT 0.4  PROT 5.6*  ALBUMIN 2.2*   No results for input(s): LIPASE, AMYLASE in the last 168 hours. No results for input(s): AMMONIA in  the last 168 hours. CBC: Recent Labs  Lab 11/28/19 1006 11/29/19 0253 11/30/19 0658 12/02/19 0745  WBC 11.3* 8.6 6.9 7.2  NEUTROABS 9.6*  --   --   --   HGB 9.8* 11.1* 14.1 9.5*  HCT 30.7* 34.0* 42.4 28.9*  MCV 93.0 90.9 89.5 90.6  PLT 260 225 191 251   Cardiac Enzymes: No results for input(s): CKTOTAL, CKMB, CKMBINDEX, TROPONINI in the last 168 hours. BNP: Invalid input(s): POCBNP CBG: Recent Labs  Lab 12/03/19 1126 12/03/19 1607 12/03/19 2121 12/04/19 0618 12/04/19 1141  GLUCAP 347* 206* 129* 149* 222*   D-Dimer No results for input(s): DDIMER in the last 72 hours. Hgb A1c No results for input(s): HGBA1C in the last 72 hours. Lipid Profile No results for input(s): CHOL, HDL, LDLCALC, TRIG, CHOLHDL, LDLDIRECT in the last 72 hours. Thyroid function studies No results for input(s): TSH, T4TOTAL, T3FREE, THYROIDAB in the last 72 hours.  Invalid input(s): FREET3 Anemia work up No results for input(s): VITAMINB12, FOLATE, FERRITIN, TIBC, IRON, RETICCTPCT in the last 72 hours. Urinalysis    Component Value Date/Time   COLORURINE YELLOW  11/28/2019 1409   APPEARANCEUR CLEAR 11/28/2019 1409   LABSPEC 1.013 11/28/2019 1409   PHURINE 7.0 11/28/2019 1409   GLUCOSEU 150 (A) 11/28/2019 1409   HGBUR NEGATIVE 11/28/2019 1409   BILIRUBINUR NEGATIVE 11/28/2019 1409   BILIRUBINUR Negative 11/09/2019 0910   KETONESUR NEGATIVE 11/28/2019 1409   PROTEINUR >=300 (A) 11/28/2019 1409   UROBILINOGEN 0.2 11/09/2019 0910   UROBILINOGEN 0.2 05/12/2017 0939   NITRITE NEGATIVE 11/28/2019 1409   LEUKOCYTESUR NEGATIVE 11/28/2019 1409   Sepsis Labs Invalid input(s): PROCALCITONIN,  WBC,  LACTICIDVEN Microbiology No results found for this or any previous visit (from the past 240 hour(s)).   Time coordinating discharge: Over 30 minutes  SIGNED:   [name], MD  [facility] 12/04/2019, 11:48 AM  If 7PM-7AM, please contact night-coverage

## 2019-12-04 NOTE — Discharge Instructions (Signed)
Preventing Hypoglycemia Hypoglycemia occurs when the level of sugar (glucose) in the blood is too low. Hypoglycemia can happen in people who do or do not have diabetes (diabetes mellitus). It can develop quickly, and it can be a medical emergency. For most people with diabetes, a blood glucose level below 70 mg/dL (3.9 mmol/L) is considered hypoglycemia. Glucose is a type of sugar that provides the body's main source of energy. Certain hormones (insulin and glucagon) control the level of glucose in the blood. Insulin lowers blood glucose, and glucagon increases blood glucose. Hypoglycemia can result from having too much insulin in the bloodstream, or from not eating enough food that contains glucose. Your risk for hypoglycemia is higher:  If you take insulin or diabetes medicines to help lower your blood glucose or help your body make more insulin.  If you skip or delay a meal or snack.  If you are ill.  During and after exercise. You can prevent hypoglycemia by working with your health care provider to adjust your meal plan as needed and by taking other precautions. How can hypoglycemia affect me? Mild symptoms Mild hypoglycemia may not cause any symptoms. If you do have symptoms, they may include:  Hunger.  Anxiety.  Sweating and feeling clammy.  Dizziness or feeling light-headed.  Sleepiness.  Nausea.  Increased heart rate.  Headache.  Blurry vision.  Irritability.  Tingling or numbness around the mouth, lips, or tongue.  A change in coordination.  Restless sleep. If mild hypoglycemia is not recognized and treated, it can quickly become moderate or severe hypoglycemia. Moderate symptoms Moderate hypoglycemia can cause:  Mental confusion and poor judgment.  Behavior changes.  Weakness.  Irregular heartbeat. Severe symptoms Severe hypoglycemia is a medical emergency. It can cause:  Fainting.  Seizures.  Loss of consciousness (coma).  Death. What  nutrition changes can be made?  Work with your health care provider or diet and nutrition specialist (dietitian) to make a healthy meal plan that is right for you. Follow your meal plan carefully.  Eat meals at regular times.  If recommended by your health care provider, have snacks between meals.  Donot skip or delay meals or snacks. You can be at risk for hypoglycemia if you are not getting enough carbohydrates. What lifestyle changes can be made?   Work closely with your health care provider to manage your blood glucose. Make sure you know: ? Your goal blood glucose levels. ? How and when to check your blood glucose. ? The symptoms of hypoglycemia. It is important to treat it right away to keep it from becoming severe.  Do not drink alcohol on an empty stomach.  When you are ill, check your blood glucose more often than usual. Follow your sick day plan whenever you cannot eat or drink normally. Make this plan in advance with your health care provider.  Always check your blood glucose before, during, and after exercise. How is this treated? This condition can often be treated by immediately eating or drinking something that contains sugar, such as:  Fruit juice, 4-6 oz (120-150 mL).  Regular (not diet) soda, 4-6 oz (120-150 mL).  Low-fat milk, 4 oz (120 mL).  Several pieces of hard candy.  Sugar or honey, 1 Tbsp (15 mL). Treating hypoglycemia if you have diabetes If you are alert and able to swallow safely, follow the 15:15 rule:  Take 15 grams of a rapid-acting carbohydrate. Talk with your health care provider about how much you should take.  Rapid-acting options  include: ? Glucose pills (take 15 grams). ? 6-8 pieces of hard candy. ? 4-6 oz (120-150 mL) of fruit juice. ? 4-6 oz (120-150 mL) of regular (not diet) soda.  Check your blood glucose 15 minutes after you take the carbohydrate.  If the repeat blood glucose level is still at or below 70 mg/dL (3.9 mmol/L),  take 15 grams of a carbohydrate again.  If your blood glucose level does not increase above 70 mg/dL (3.9 mmol/L) after 3 tries, seek emergency medical care.  After your blood glucose level returns to normal, eat a meal or a snack within 1 hour. Treating severe hypoglycemia Severe hypoglycemia is when your blood glucose level is at or below 54 mg/dL (3 mmol/L). Severe hypoglycemia is a medical emergency. Get medical help right away. If you have severe hypoglycemia and you cannot eat or drink, you may need an injection of glucagon. A family member or close friend should learn how to check your blood glucose and how to give you a glucagon injection. Ask your health care provider if you need to have an emergency glucagon injection kit available. Severe hypoglycemia may need to be treated in a hospital. The treatment may include getting glucose through an IV. You may also need treatment for the cause of your hypoglycemia. Where to find more information  American Diabetes Association: www.diabetes.[employer] of Diabetes and Digestive and Kidney Diseases: [employer] Contact a health care provider if:  You have problems keeping your blood glucose in your target range.  You have frequent episodes of hypoglycemia. Get help right away if:  You continue to have hypoglycemia symptoms after eating or drinking something containing glucose.  Your blood glucose level is at or below 54 mg/dL (3 mmol/L).  You faint.  You have a seizure. These symptoms may represent a serious problem that is an emergency. Do not wait to see if the symptoms will go away. Get medical help right away. Call your local emergency services (911 in the [place] Summary  Know the symptoms of hypoglycemia, and when you are at risk for it (such as during exercise or when you are sick). Check your blood glucose often when you are at risk for hypoglycemia.  Hypoglycemia can develop quickly, and it can be dangerous  if it is not treated right away. If you have a history of severe hypoglycemia, make sure you know how to use your glucagon injection kit.  Make sure you know how to treat hypoglycemia. Keep a carbohydrate snack available when you may be at risk for hypoglycemia. This information is not intended to replace advice given to you by your health care provider. Make sure you discuss any questions you have with your health care provider. Document Revised: 01/21/2019 Document Reviewed: 05/27/2017 Elsevier Patient Education  [place].

## 2019-12-05 ENCOUNTER — Telehealth: Payer: Self-pay | Admitting: Family Medicine

## 2019-12-05 NOTE — Telephone Encounter (Signed)
Patient seen in the ED on 11/28/2019 & 11/22/2019 Hyperglycemia. His next appointment is on 02/07/2020. Do you need to see him sooner.

## 2019-12-07 ENCOUNTER — Other Ambulatory Visit: Payer: Self-pay

## 2019-12-07 ENCOUNTER — Telehealth: Payer: Self-pay

## 2019-12-07 NOTE — Telephone Encounter (Signed)
A one-time Home Health Assessment / home visit has been granted to Dr [facility]).  All future orders should be sent to Dr. Abelino Derrick his new PCP. Patient has an appointment with Dr. Ethelene Hal on 12/08/2019. Ok per [employer].

## 2019-12-08 ENCOUNTER — Other Ambulatory Visit (INDEPENDENT_AMBULATORY_CARE_PROVIDER_SITE_OTHER): Payer: Medicare Other

## 2019-12-08 ENCOUNTER — Encounter: Payer: Self-pay | Admitting: Family Medicine

## 2019-12-08 ENCOUNTER — Ambulatory Visit (INDEPENDENT_AMBULATORY_CARE_PROVIDER_SITE_OTHER): Payer: Medicare Other | Admitting: Family Medicine

## 2019-12-08 VITALS — BP 164/78 | HR 69 | Temp 96.6°F | Ht 67.0 in | Wt 148.2 lb

## 2019-12-08 DIAGNOSIS — Z609 Problem related to social environment, unspecified: Secondary | ICD-10-CM

## 2019-12-08 DIAGNOSIS — Z794 Long term (current) use of insulin: Secondary | ICD-10-CM

## 2019-12-08 DIAGNOSIS — E538 Deficiency of other specified B group vitamins: Secondary | ICD-10-CM

## 2019-12-08 DIAGNOSIS — E1165 Type 2 diabetes mellitus with hyperglycemia: Secondary | ICD-10-CM

## 2019-12-08 DIAGNOSIS — D649 Anemia, unspecified: Secondary | ICD-10-CM

## 2019-12-08 DIAGNOSIS — E1121 Type 2 diabetes mellitus with diabetic nephropathy: Secondary | ICD-10-CM

## 2019-12-08 DIAGNOSIS — Z91148 Patient's other noncompliance with medication regimen for other reason: Secondary | ICD-10-CM

## 2019-12-08 DIAGNOSIS — IMO0002 Reserved for concepts with insufficient information to code with codable children: Secondary | ICD-10-CM

## 2019-12-08 DIAGNOSIS — I10 Essential (primary) hypertension: Secondary | ICD-10-CM

## 2019-12-08 DIAGNOSIS — N184 Chronic kidney disease, stage 4 (severe): Secondary | ICD-10-CM

## 2019-12-08 DIAGNOSIS — I739 Peripheral vascular disease, unspecified: Secondary | ICD-10-CM

## 2019-12-08 DIAGNOSIS — Z8639 Personal history of other endocrine, nutritional and metabolic disease: Secondary | ICD-10-CM

## 2019-12-08 DIAGNOSIS — F0390 Unspecified dementia without behavioral disturbance: Secondary | ICD-10-CM | POA: Diagnosis not present

## 2019-12-08 DIAGNOSIS — Z659 Problem related to unspecified psychosocial circumstances: Secondary | ICD-10-CM | POA: Insufficient documentation

## 2019-12-08 DIAGNOSIS — Z9114 Patient's other noncompliance with medication regimen: Secondary | ICD-10-CM

## 2019-12-08 LAB — COMPREHENSIVE METABOLIC PANEL
ALT: 15 U/L (ref 0–53)
AST: 16 U/L (ref 0–37)
Albumin: 2.9 g/dL — ABNORMAL LOW (ref 3.5–5.2)
Alkaline Phosphatase: 85 U/L (ref 39–117)
BUN: 24 mg/dL — ABNORMAL HIGH (ref 6–23)
CO2: 24 mEq/L (ref 19–32)
Calcium: 7.9 mg/dL — ABNORMAL LOW (ref 8.4–10.5)
Chloride: 105 mEq/L (ref 96–112)
Creatinine, Ser: 2.61 mg/dL — ABNORMAL HIGH (ref 0.40–1.50)
GFR: 29.15 mL/min — ABNORMAL LOW (ref >60.00)
Glucose, Bld: 326 mg/dL — ABNORMAL HIGH (ref 70–99)
Potassium: 4.2 mEq/L (ref 3.5–5.1)
Sodium: 137 mEq/L (ref 135–145)
Total Bilirubin: 0.3 mg/dL (ref 0.2–1.2)
Total Protein: 6.2 g/dL (ref 6.0–8.3)

## 2019-12-08 LAB — CBC
HCT: 30 % — ABNORMAL LOW (ref 39.0–52.0)
Hemoglobin: 10.1 g/dL — ABNORMAL LOW (ref 13.0–17.0)
MCHC: 33.5 g/dL (ref 30.0–36.0)
MCV: 90.2 fl (ref 78.0–100.0)
Platelets: 301 10*3/uL (ref 150.0–400.0)
RBC: 3.33 Mil/uL — ABNORMAL LOW (ref 4.22–5.81)
RDW: 14.2 % (ref 11.5–15.5)
WBC: 6.8 10*3/uL (ref 4.0–10.5)

## 2019-12-08 LAB — GLUCOSE, POCT (MANUAL RESULT ENTRY): POC Glucose: 192 mg/dl — AB (ref 70–99)

## 2019-12-08 LAB — IRON,TIBC AND FERRITIN PANEL
%SAT: 22 % (calc) (ref 20–48)
Ferritin: 139 ng/mL (ref 24–380)
Iron: 51 ug/dL (ref 50–180)
TIBC: 232 mcg/dL (calc) — ABNORMAL LOW (ref 250–425)

## 2019-12-08 LAB — B12 AND FOLATE PANEL
Folate: >23.7 ng/mL (ref >5.9)
Vitamin B-12: 490 pg/mL (ref 211–911)

## 2019-12-08 NOTE — Progress Notes (Addendum)
Established Patient Office Visit  Subjective:  Patient ID: [name] [name], male    DOB: 05-25-1946  Age: 74 y.o. MRN: [id number]  CC:  Chief Complaint  Patient presents with  . Establish Care    New patient, hospital follow up.     HPI [name] [name] presents for hospital discharge follow-up and establishment of care.  Significant past medical history of type 2 diabetes uncontrolled with chronic kidney disease at stage IV, depression cognitive decline and a poor social situation.  Patient lives alone.  He has 2 sisters that check on him and bring him his meals 1 in the morning and 1 in the evening.  They are helping him with his medicines at that time as well.  Visiting home health has recently been arranged.  Multiple hospital admissions for hypoglycemia.  For example sister present today told tells me that his sugar was 300 last night before bed and she had decided to hold his insulin last night.  When the other sister arrived this morning his blood sugar was 40.  He was given juice prior to coming in for his appointment today measured blood sugar today was 190.  He did not receive any of his other medications including his blood pressure medicines this morning.  Chart review reveals past medical history of alcoholism narcotic dependency.  At this time he is not smoking, drinking alcohol or using illicit drugs.  Present sister says that they are also caring for their 48 year old mother as well as a family member with Down syndrome.  Past Medical History:  Diagnosis Date  . 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 Closed comminuted intertrochanteric fracture of left femur ([place])   . Diabetes mellitus without complication ([place])   . Diabetic hyperosmolar non-ketotic state ([place]) 05/15/2016  . DKA (diabetic ketoacidoses) ([place]) 05/09/2016  . Hypertension   . Hypoglycemia 11/29/2019  . Postoperative anemia due to acute blood loss 06/18/2016  .  Retroperitoneal hematoma 06/18/2016  . Stroke ([place])   . Vitamin B 12 deficiency 06/18/2016    Past Surgical History:  Procedure Laterality Date  . HIP ARTHROPLASTY Right 02/05/2013   Procedure: ARTHROPLASTY BIPOLAR HIP;  Surgeon: [name], MD;  Location: [facility];  Service: Orthopedics;  Laterality: Right;  . INTRAMEDULLARY (IM) NAIL INTERTROCHANTERIC Left 06/16/2016   Procedure: INTRAMEDULLARY (IM) NAIL INTERTROCHANTRIC;  Surgeon: Rod Can, MD;  Location: [facility];  Service: Orthopedics;  Laterality: Left;    Family History  Problem Relation Age of Onset  . Alzheimer's disease Mother   . Hypertension Mother   . Hyperlipidemia Father   . Hypertension Father   . Healthy Maternal Grandmother   . Pneumonia Maternal Grandfather     Social History   Socioeconomic History  . Marital status: Single    Spouse name: Not on file  . Number of children: 2  . Years of education: 10  . Highest education level: Not on file  Occupational History  . Occupation: Retired  Tobacco Use  . Smoking status: Former Smoker    Packs/day: 0.25    Years: 30.00    Pack years: 7.50    Types: Cigarettes    Quit date: 02/01/2016    Years since quitting: 4.0  . Smokeless tobacco: Never Used  Substance and Sexual Activity  . Alcohol use: No  . Drug use: No  . Sexual activity: Not Currently  Other Topics Concern  . Not on file  Social History Narrative  Fun: Likes to do handyman related projects, photography.    Denies religious beliefs effecting health care.    Social Determinants of Health   Financial Resource Strain:   . Difficulty of Paying Living Expenses:   Food Insecurity:   . Worried About Charity fundraiser in the Last Year:   . Arboriculturist in the Last Year:   Transportation Needs:   . Film/video editor (Medical):   Marland Kitchen Lack of Transportation (Non-Medical):   Physical Activity:   . Days of Exercise per Week:   . Minutes of Exercise per Session:   Stress:   . Feeling  of Stress :   Social Connections:   . Frequency of Communication with Friends and Family:   . Frequency of Social Gatherings with Friends and Family:   . Attends Religious Services:   . Active Member of Clubs or Organizations:   . Attends Archivist Meetings:   Marland Kitchen Marital Status:   Intimate Partner Violence:   . Fear of Current or Ex-Partner:   . Emotionally Abused:   Marland Kitchen Physically Abused:   . Sexually Abused:     Outpatient Medications Prior to Visit  Medication Sig Dispense Refill  . amLODipine (NORVASC) 10 MG tablet TAKE 1 TABLET ONCE DAILY. (Patient taking differently: Take 10 mg by mouth daily. ) 30 tablet 0  . aspirin 81 MG chewable tablet Chew 1 tablet (81 mg total) by mouth daily. 90 tablet 0  . atorvastatin (LIPITOR) 10 MG tablet Take 1 tablet (10 mg total) by mouth daily. 30 tablet 3  . Blood Glucose Monitoring Suppl (ACCU-CHEK GUIDE) w/Device KIT 1 Device by Does not apply route QID. (Patient not taking: Reported on 01/03/2020) 1 kit 0  . FLUoxetine (PROZAC) 40 MG capsule Take 1 capsule (40 mg total) by mouth daily. 30 capsule 6  . furosemide (LASIX) 20 MG tablet Take 1 tablet (20 mg total) by mouth daily. 30 tablet 1  . glucose blood (ACCU-CHEK GUIDE) test strip Use as instructed 100 each 12  . glucose blood (TRUE METRIX BLOOD GLUCOSE TEST) test strip CHECK BLOOD SUGAR UP TO 4 TIMES A DAY. (Patient taking differently: CHECK BLOOD SUGAR UP TO 2 TIMES A DAY.) 300 each 0  . hydrALAZINE (APRESOLINE) 50 MG tablet Take 1 tablet (50 mg total) by mouth every 8 (eight) hours. 90 tablet 1  . Insulin Syringe-Needle U-100 (BD INSULIN SYRINGE U/F) 31G X 5/16" 0.3 ML MISC Inject 1 each as directed 2 (two) times daily. as directed 10 each 0  . vitamin B-12 1000 MCG tablet Take 1 tablet (1,000 mcg total) by mouth daily. 60 tablet 1  . Vitamin D, Ergocalciferol, (DRISDOL) 1.25 MG (50000 UNIT) CAPS capsule Take 1 capsule (50,000 Units total) by mouth once a week. 5 capsule 6  .  ACCU-CHEK GUIDE test strip USE AS DIRECTED 50 strip 0  . Insulin Glargine (LANTUS SOLOSTAR) 100 UNIT/ML Solostar Pen Take 10 units lantus in the morning and 3 units lantus at bedtime. 15 mL 5  . Insulin Pen Needle (B-D ULTRAFINE III SHORT PEN) 31G X 8 MM MISC USE WITH INJECTIONS 100 each PRN   No facility-administered medications prior to visit.    No Known Allergies  ROS Review of Systems  Constitutional: Negative.   HENT: Negative.   Eyes: Negative for photophobia and visual disturbance.  Respiratory: Negative.   Cardiovascular: Negative.   Gastrointestinal: Negative.   Endocrine: Negative for polyphagia and polyuria.  Musculoskeletal: Negative for  joint swelling and myalgias.  Neurological: Negative for seizures and speech difficulty.  Psychiatric/Behavioral: Positive for confusion and decreased concentration.      Objective:    Physical Exam  Constitutional: He is oriented to person, place, and time. He appears well-developed. No distress.  HENT:  Head: Normocephalic and atraumatic.  Right Ear: External ear normal.  Left Ear: External ear normal.  Eyes: Conjunctivae are normal. Right eye exhibits no discharge. Left eye exhibits no discharge. No scleral icterus.  Neck: No JVD present. No tracheal deviation present.  Cardiovascular: Normal rate, regular rhythm and normal heart sounds.  Pulmonary/Chest: Effort normal and breath sounds normal. No stridor.  Abdominal: Bowel sounds are normal. There is no abdominal tenderness.  Musculoskeletal:        General: Edema present.  Neurological: He is alert and oriented to person, place, and time.  Oriented to person, place. Michela Pitcher that the year was 2000 but did say that the [place] was [place] and that [place] counties.   Skin: Skin is warm and dry. He is not diaphoretic.  Psychiatric: He has a normal mood and affect. His behavior is normal.    BP (!) 164/78   Pulse 69   Temp (!) 96.6 F (35.9 C) (Tympanic)   Ht '5\' 7"'   (1.702 m)   Wt 148 lb 3.2 oz (67.2 kg)   SpO2 98%   BMI 23.21 kg/m  Wt Readings from Last 3 Encounters:  01/03/20 141 lb 6.4 oz (64.1 kg)  12/08/19 148 lb 3.2 oz (67.2 kg)  11/29/19 151 lb 7.3 oz (68.7 kg)     Health Maintenance Due  Topic Date Due  . COVID-19 Vaccine (1) Never done  . COLONOSCOPY  Never done  . OPHTHALMOLOGY EXAM  10/14/2016  . FOOT EXAM  05/12/2018    There are no preventive care reminders to display for this patient.  Lab Results  Component Value Date   TSH 3.203 11/23/2019   Lab Results  Component Value Date   WBC 6.8 12/08/2019   HGB 10.1 (L) 12/08/2019   HCT 30.0 (L) 12/08/2019   MCV 90.2 12/08/2019   PLT 301.0 12/08/2019   Lab Results  Component Value Date   NA 137 12/08/2019   K 4.2 12/08/2019   CO2 24 12/08/2019   GLUCOSE 326 (H) 12/08/2019   BUN 24 (H) 12/08/2019   CREATININE 2.61 (H) 12/08/2019   BILITOT 0.3 12/08/2019   ALKPHOS 85 12/08/2019   AST 16 12/08/2019   ALT 15 12/08/2019   PROT 6.2 12/08/2019   ALBUMIN 2.9 (L) 12/08/2019   CALCIUM 7.9 (L) 12/08/2019   ANIONGAP 9 12/03/2019   GFR 29.15 (L) 12/08/2019   Lab Results  Component Value Date   CHOL 220 (H) 03/16/2019   Lab Results  Component Value Date   HDL 61 03/16/2019   Lab Results  Component Value Date   LDLCALC 117 (H) 03/16/2019   Lab Results  Component Value Date   TRIG 212 (H) 03/16/2019   Lab Results  Component Value Date   CHOLHDL 3.6 03/16/2019   Lab Results  Component Value Date   HGBA1C 10.7 (H) 11/23/2019      Assessment & Plan:   Problem List Items Addressed This Visit      Cardiovascular and Mediastinum   Hypertension   Relevant Orders   Iron, TIBC and Ferritin Panel (Completed)   B12 and Folate Panel (Completed)   Comprehensive metabolic panel (Completed)   Urinalysis, Routine w reflex microscopic (  Completed)   CBC (Completed)   PVD (peripheral vascular disease) ([name])     Endocrine   Uncontrolled type 2 diabetes mellitus with  diabetic nephropathy, with long-term current use of insulin ([name]) - Primary (Chronic)   Relevant Orders   POCT Glucose (CBG) (Completed)   Ambulatory referral to Endocrinology   Iron, TIBC and Ferritin Panel (Completed)   Comprehensive metabolic panel (Completed)   Urinalysis, Routine w reflex microscopic (Completed)   CBC (Completed)     Nervous and Auditory   Dementia ([name])   Relevant Orders   B12 and Folate Panel (Completed)   Comprehensive metabolic panel (Completed)   CBC (Completed)     Genitourinary   CKD (chronic kidney disease) stage 4, GFR 15-29 ml/min ([name])   Relevant Orders   Iron, TIBC and Ferritin Panel (Completed)   B12 and Folate Panel (Completed)   Comprehensive metabolic panel (Completed)   Urinalysis, Routine w reflex microscopic (Completed)   CBC (Completed)     Other   Vitamin B 12 deficiency   Relevant Orders   B12 and Folate Panel (Completed)   Comprehensive metabolic panel (Completed)   Noncompliance with medication regimen   Anemia   Relevant Orders   Iron, TIBC and Ferritin Panel (Completed)   Comprehensive metabolic panel (Completed)   CBC (Completed)   History of hypoglycemia   Poor social situation      No orders of the defined types were placed in this encounter.   Follow-up: Return in about 1 month (around 01/05/2020), or Continue all meds. Check and record bps..   Asked the sister to have visiting home health check his blood pressure and record these for me.  Seeking endocrinology input or more stable and safe diabetic regimen.  Libby Maw, MD

## 2019-12-09 LAB — URINALYSIS, ROUTINE W REFLEX MICROSCOPIC
Bilirubin Urine: NEGATIVE
Ketones, ur: NEGATIVE
Leukocytes,Ua: NEGATIVE
Nitrite: NEGATIVE
Specific Gravity, Urine: 1.02 (ref 1.000–1.030)
Total Protein, Urine: 100 — AB
Urine Glucose: >=1000 — AB
Urobilinogen, UA: 0.2 (ref 0.0–1.0)
WBC, UA: NONE SEEN (ref 0–?)
pH: 7 (ref 5.0–8.0)

## 2019-12-09 IMAGING — CT CT MAXILLOFACIAL W/O CM
5 of 11 series · 15 of 47 positions shown, 17 images · non-contrast
Comparison: CT HEAD August 09, 2017 and CT cervical spine [place]

CLINICAL DATA: Fall, headache, facial and neck pain. History of
stroke, hypertension, diabetes.

EXAM:
CT HEAD WITHOUT CONTRAST
CT MAXILLOFACIAL WITHOUT CONTRAST
CT CERVICAL SPINE WITHOUT CONTRAST
TECHNIQUE: Multidetector CT imaging of the head, cervical spine, and
maxillofacial structures were performed using the standard protocol
without intravenous contrast. Multiplanar CT image reconstructions
of the cervical spine and maxillofacial structures were also
generated.

[Series 4: head bone · axial · 0.47mm/px · z∈[+1503,+1584]mm · 3 of 55 slices shown]
[im 14/55  bone]
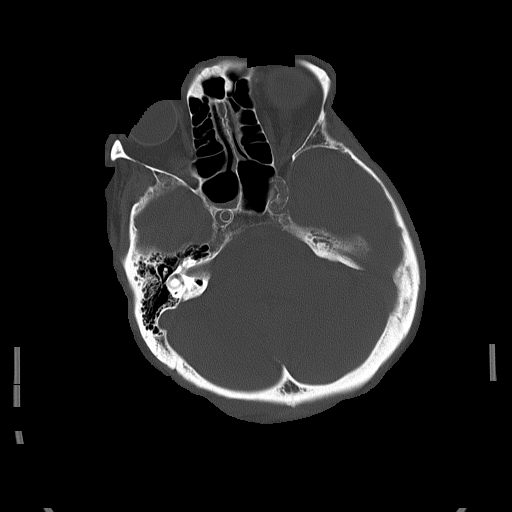
[im 28/55  bone]
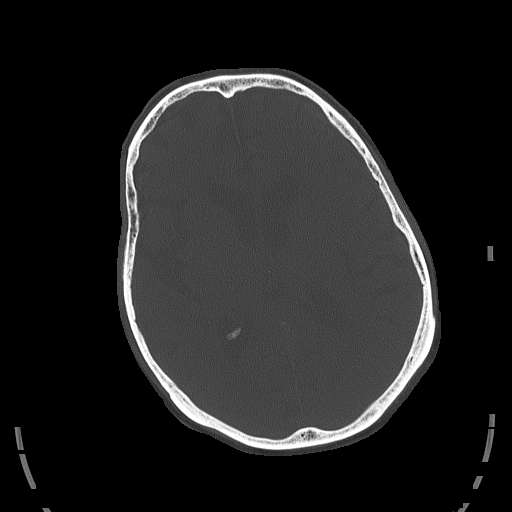
[im 41/55  bone]
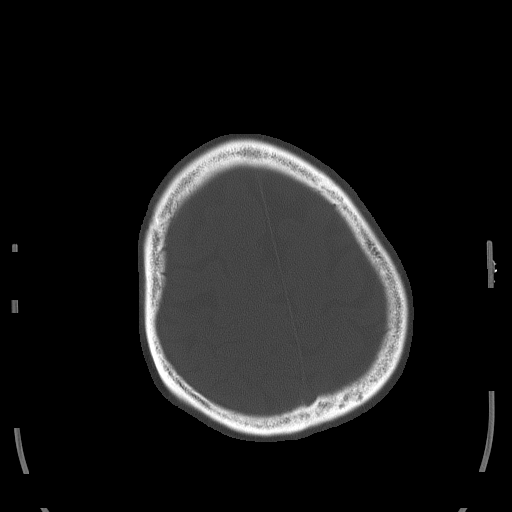

[Series 6: coronal soft tissue · coronal · 0.32mm/px · 2 of 67 slices shown]
[im 23/67  bone]
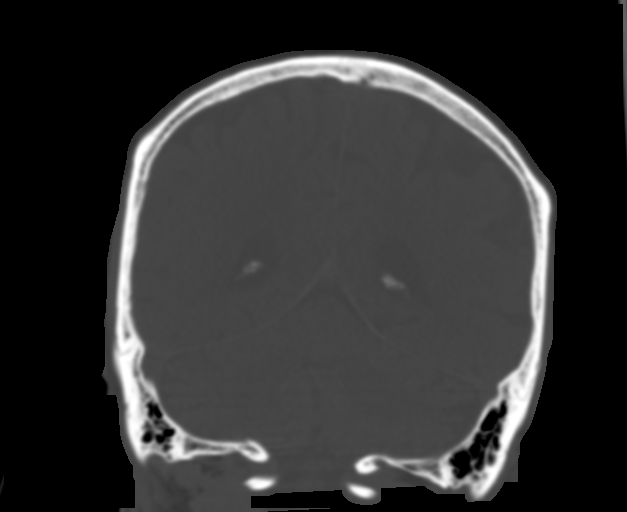
[im 45/67  bone]
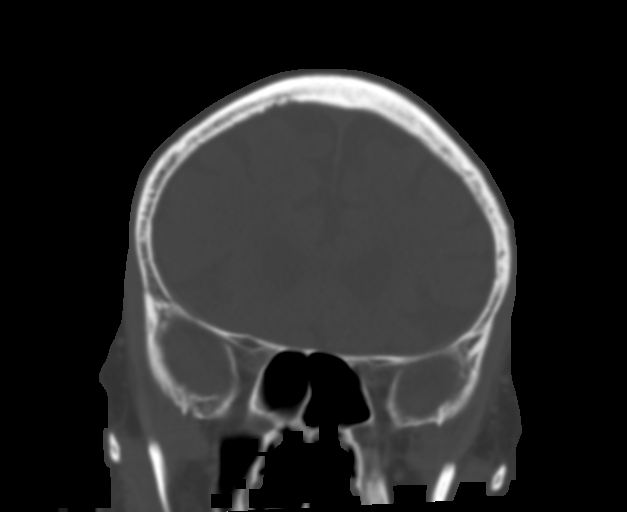

[Series 9: c spine soft · axial · 0.29mm/px · z∈[+1330,+1384]mm · 3 of 81 slices shown]
[im 14/81  brain]
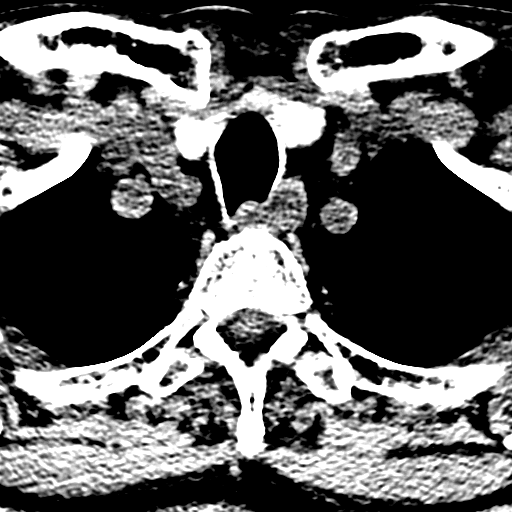
[im 27/81  brain]
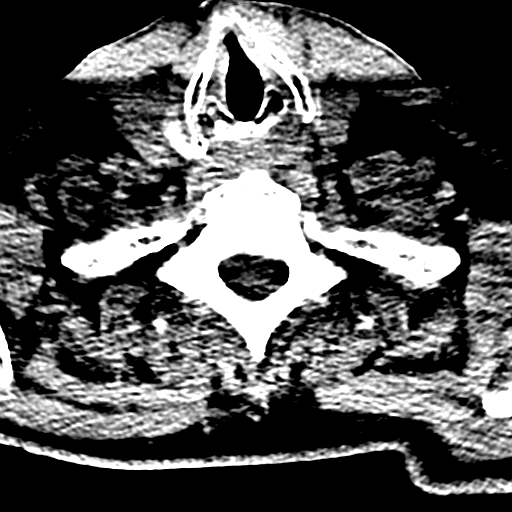
[im 41/81  brain]
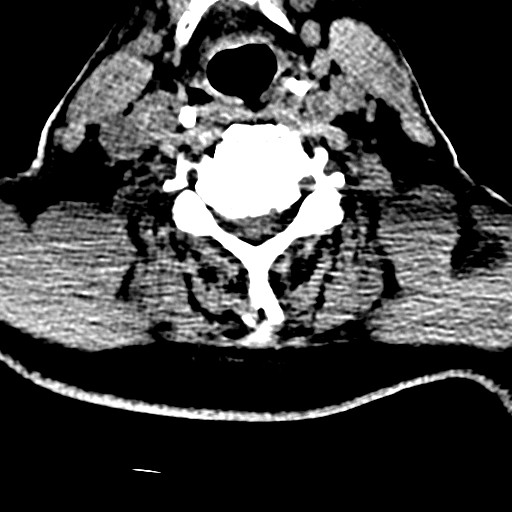

[Series 11: orthogonal axials · axial · 0.23mm/px · z∈[+1311,+1417]mm · 6 of 85 slices shown, 8 images]
[im 13/85  brain]
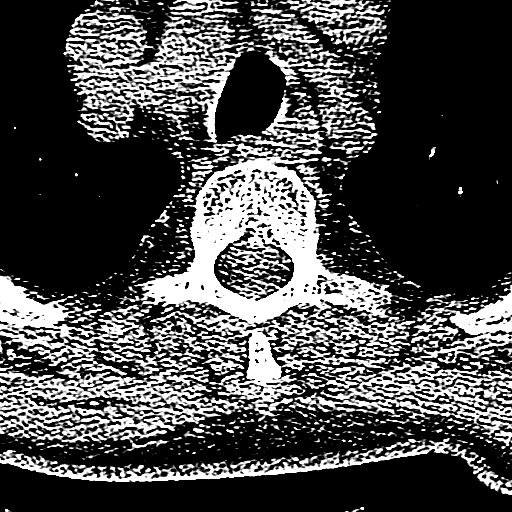
[im 13/85  bone]
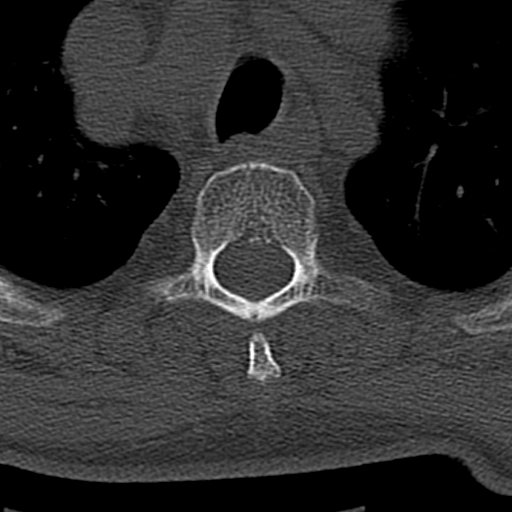
[im 25/85  bone]
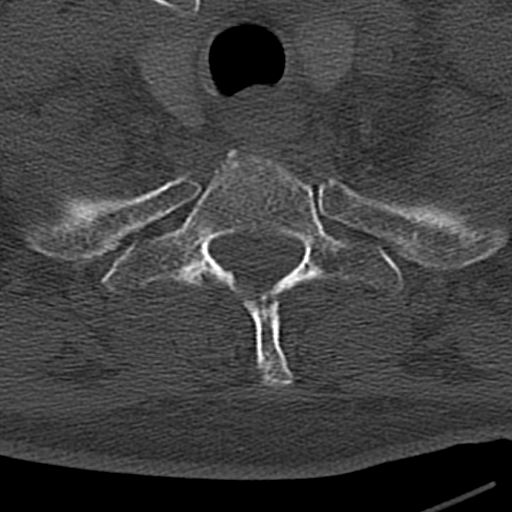
[im 37/85  bone]
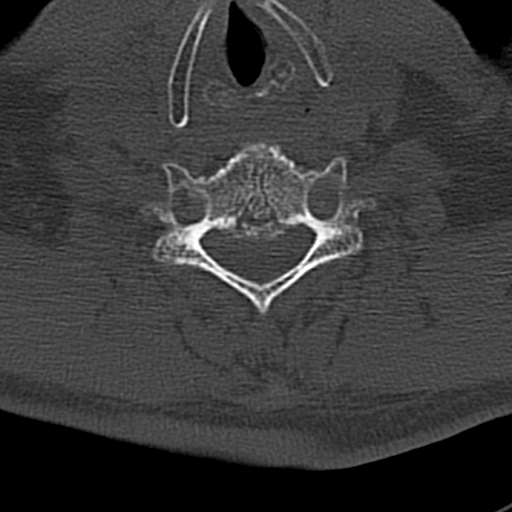
[im 49/85  bone]
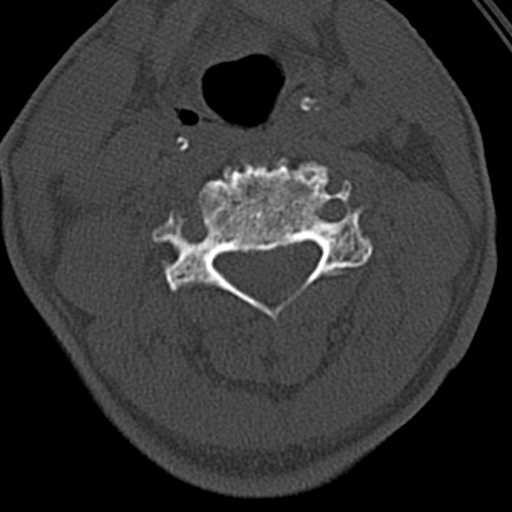
[im 61/85  brain]
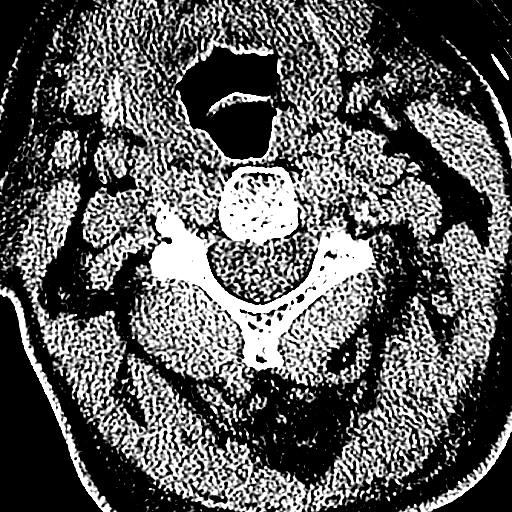
[im 61/85  bone]
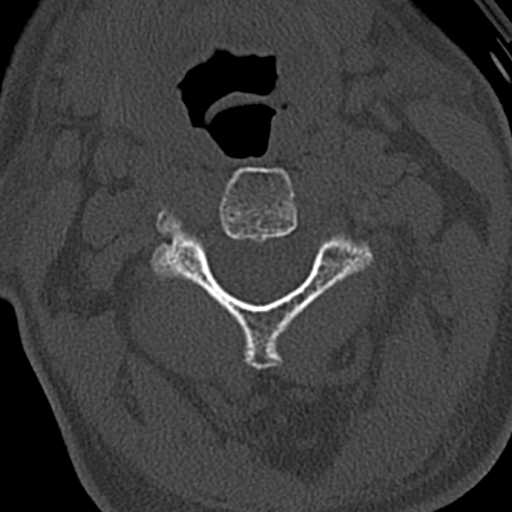
[im 73/85  bone]
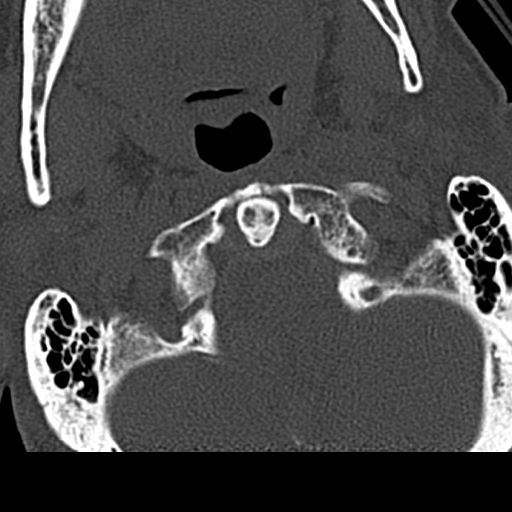

[Series 20: sagittal soft · sagittal · 0.36mm/px · 1 of 82 slices shown]
[im 41/82  bone]
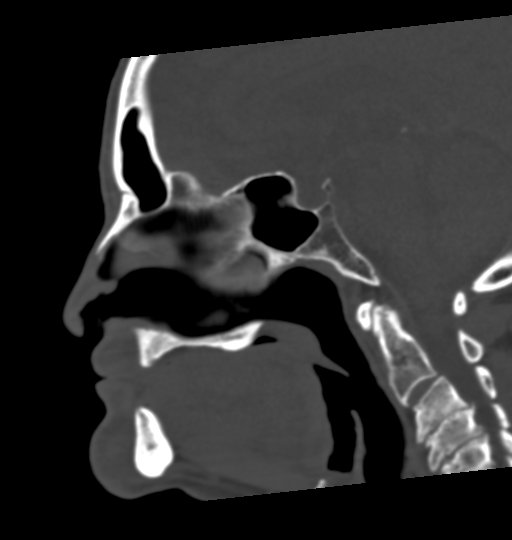

[15 of 47 positions shown; findings below may reference images not displayed]

FINDINGS: CT HEAD FINDINGS

BRAIN: No intraparenchymal hemorrhage, mass effect nor midline
shift. The ventricles and sulci are normal for age. Confluent
supratentorial white matter hypodensities. No acute large vascular
territory infarcts. No abnormal extra-axial fluid collections. Basal
cisterns are patent.

VASCULAR: Moderate to severe calcific atherosclerosis of the carotid
siphons.

SKULL: No skull fracture. No significant scalp soft tissue swelling.

OTHER: None.

CT MAXILLOFACIAL FINDINGS

OSSEOUS: No acute facial fracture. The mandible is intact, the
condyles are located. No destructive bony lesions. Moderate LEFT
temporomandibular osteoarthrosis. Patient is edentulous.

ORBITS: Old LEFT orbital floor fracture with mild external
herniation of extraconal fat. Ocular globes intact, lenses are
located. Preservation orbital fat.

SINUSES: Perforated nasal septum. Mild RIGHT sphenoid sinus mucosal
thickening without air-fluid levels. Intact nasal septum is midline.
Mastoid aircells are well aerated.

SOFT TISSUES: No significant soft tissue swelling. No subcutaneous
gas or radiopaque foreign bodies.

CT CERVICAL SPINE FINDINGS

ALIGNMENT: Straightened cervical lordosis. Minimal grade 1 C7-T1
anterolisthesis.

SKULL BASE AND VERTEBRAE: Cervical vertebral bodies and posterior
elements are intact. Mild old T2 compression fracture. Severe C3-4
through C6-7 disc height loss with endplate sclerosis and marginal
spurring consistent with degenerative disc. Moderate RIGHT C2-3
facet arthropathy.

SOFT TISSUES AND SPINAL CANAL: Nonacute. Mild calcific
atherosclerosis carotid siphon.

DISC LEVELS: Moderate canal stenosis C5-6 and C6-7. Neural foraminal
narrowing C3-4 through C7-T1 varying from moderate to severe.

UPPER CHEST: Lung apices are clear.

OTHER: None.
IMPRESSION: CT HEAD:

1. No acute intracranial process.
2. Increased moderate chronic small vessel ischemic changes.

CT MAXILLOFACIAL:

1. No acute facial fracture.

CT CERVICAL SPINE:

1. No fracture.  Minimal grade 1 C7-T1 anterolisthesis.
2. Similar advanced spondylosis resulting in moderate canal stenosis
C5-6 and C6-7, multi level neural foraminal narrowing.

## 2019-12-10 IMAGING — CR DG RIBS W/ CHEST 3+V BILAT
6 series · 6 of 6 positions shown · non-contrast
Comparison: Chest radiograph August 06, 2017

CLINICAL DATA: Fell, landing on chest.  Rib pain.

EXAM:
BILATERAL RIBS AND CHEST - 4+ VIEW

[x chest ap (1 of 6)]
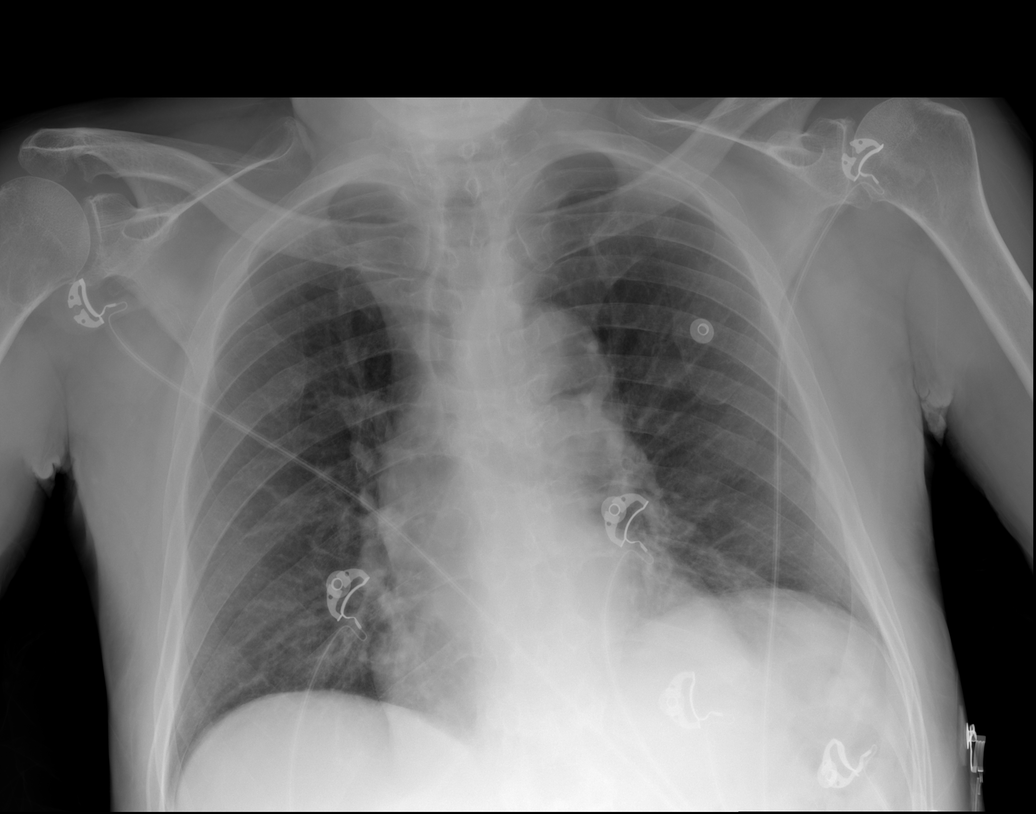

[x chest ap (2 of 6)]
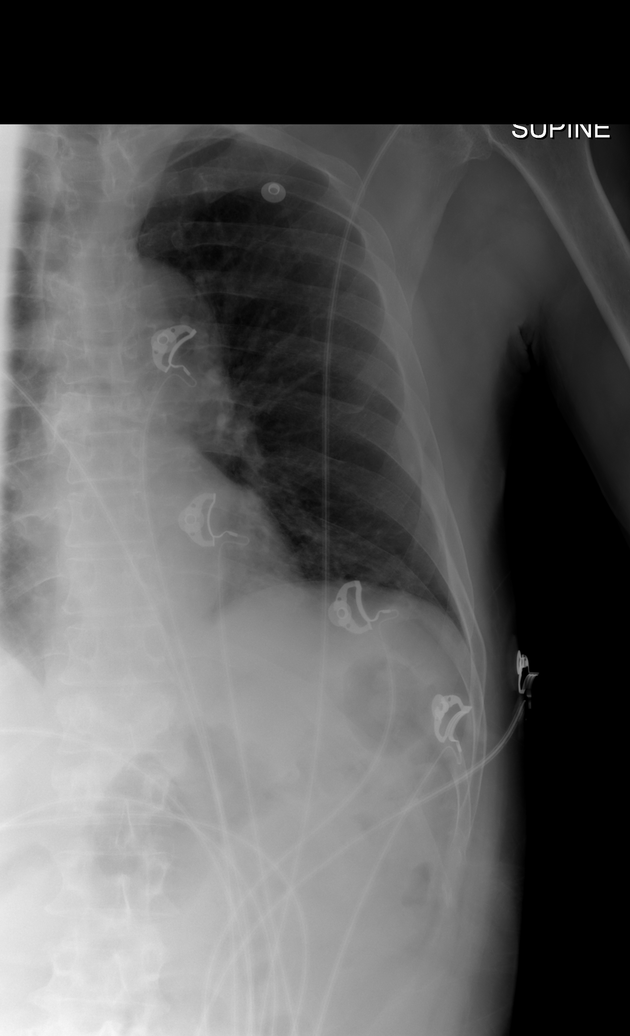

[x chest ap (3 of 6)]
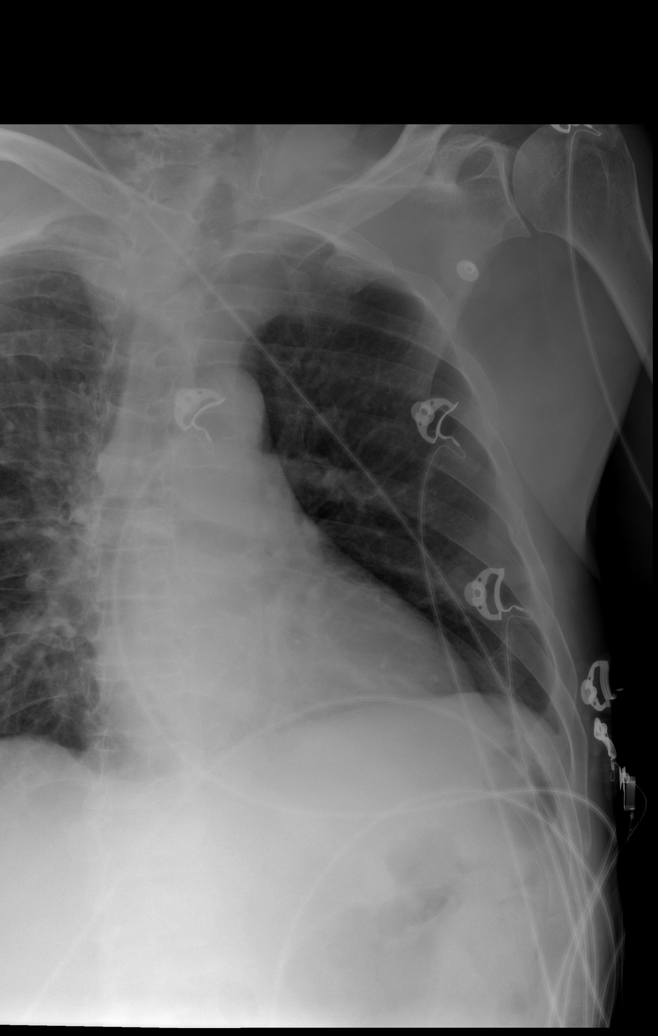

[x chest ap (4 of 6)]
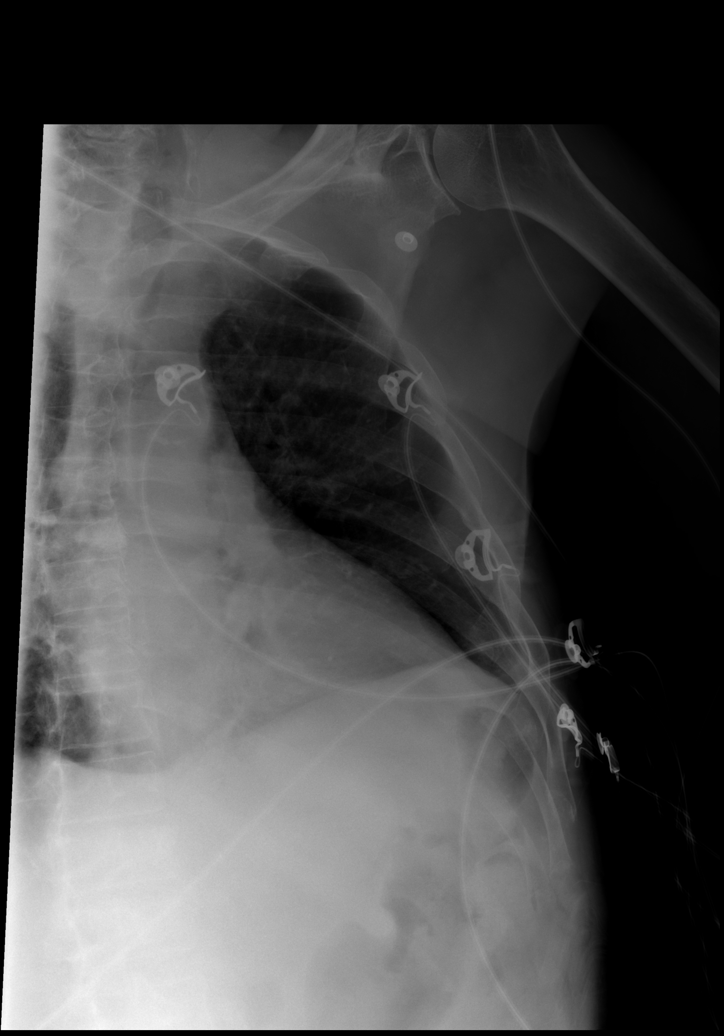

[x chest ap (5 of 6)]
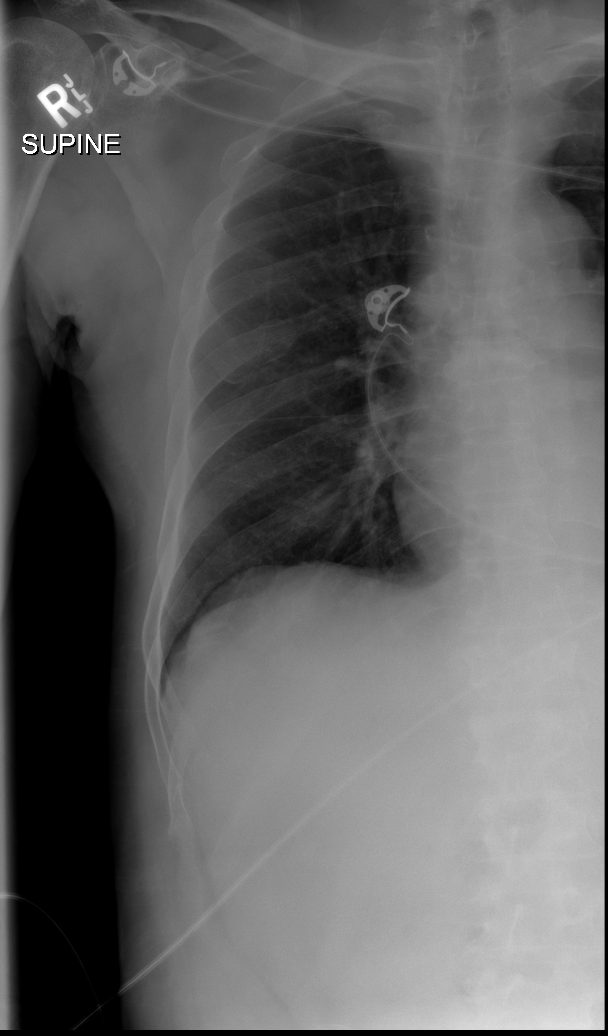

[x chest ap (6 of 6)]
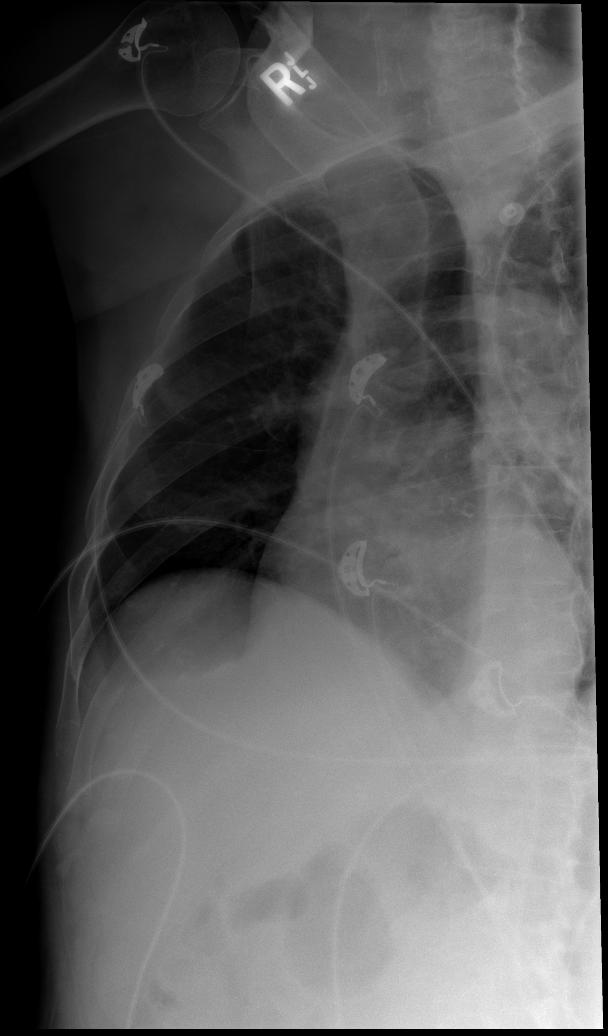

[6 of 6 positions shown; findings below may reference images not displayed]

FINDINGS: No acute fracture or other bone lesions are seen involving the ribs.
Old bilateral rib fractures. There is no evidence of pneumothorax or
pleural effusion. No pleural effusion or focal consolidation.
Minimal bibasilar atelectasis. No pneumothorax. Heart size is upper
limits of normal in size. Mediastinal silhouette is not suspicious.
IMPRESSION: 1. No acute rib fracture deformity.
2. Borderline cardiomegaly, minimal bibasilar atelectasis.

## 2019-12-13 DIAGNOSIS — E11649 Type 2 diabetes mellitus with hypoglycemia without coma: Secondary | ICD-10-CM | POA: Diagnosis not present

## 2019-12-13 DIAGNOSIS — N1832 Chronic kidney disease, stage 3b: Secondary | ICD-10-CM | POA: Diagnosis not present

## 2019-12-13 DIAGNOSIS — I69318 Other symptoms and signs involving cognitive functions following cerebral infarction: Secondary | ICD-10-CM | POA: Diagnosis not present

## 2019-12-13 DIAGNOSIS — D631 Anemia in chronic kidney disease: Secondary | ICD-10-CM | POA: Diagnosis not present

## 2019-12-13 DIAGNOSIS — E538 Deficiency of other specified B group vitamins: Secondary | ICD-10-CM | POA: Diagnosis not present

## 2019-12-13 DIAGNOSIS — I129 Hypertensive chronic kidney disease with stage 1 through stage 4 chronic kidney disease, or unspecified chronic kidney disease: Secondary | ICD-10-CM | POA: Diagnosis not present

## 2019-12-13 DIAGNOSIS — E1122 Type 2 diabetes mellitus with diabetic chronic kidney disease: Secondary | ICD-10-CM | POA: Diagnosis not present

## 2019-12-13 DIAGNOSIS — G9341 Metabolic encephalopathy: Secondary | ICD-10-CM | POA: Diagnosis not present

## 2019-12-16 DIAGNOSIS — N1832 Chronic kidney disease, stage 3b: Secondary | ICD-10-CM | POA: Diagnosis not present

## 2019-12-16 DIAGNOSIS — G9341 Metabolic encephalopathy: Secondary | ICD-10-CM | POA: Diagnosis not present

## 2019-12-16 DIAGNOSIS — E11649 Type 2 diabetes mellitus with hypoglycemia without coma: Secondary | ICD-10-CM | POA: Diagnosis not present

## 2019-12-16 DIAGNOSIS — E538 Deficiency of other specified B group vitamins: Secondary | ICD-10-CM | POA: Diagnosis not present

## 2019-12-16 DIAGNOSIS — I69318 Other symptoms and signs involving cognitive functions following cerebral infarction: Secondary | ICD-10-CM | POA: Diagnosis not present

## 2019-12-16 DIAGNOSIS — D631 Anemia in chronic kidney disease: Secondary | ICD-10-CM | POA: Diagnosis not present

## 2019-12-16 DIAGNOSIS — E1122 Type 2 diabetes mellitus with diabetic chronic kidney disease: Secondary | ICD-10-CM | POA: Diagnosis not present

## 2019-12-16 DIAGNOSIS — I129 Hypertensive chronic kidney disease with stage 1 through stage 4 chronic kidney disease, or unspecified chronic kidney disease: Secondary | ICD-10-CM | POA: Diagnosis not present

## 2019-12-17 DIAGNOSIS — G9341 Metabolic encephalopathy: Secondary | ICD-10-CM | POA: Diagnosis not present

## 2019-12-17 DIAGNOSIS — N1832 Chronic kidney disease, stage 3b: Secondary | ICD-10-CM | POA: Diagnosis not present

## 2019-12-17 DIAGNOSIS — E1122 Type 2 diabetes mellitus with diabetic chronic kidney disease: Secondary | ICD-10-CM | POA: Diagnosis not present

## 2019-12-17 DIAGNOSIS — E11649 Type 2 diabetes mellitus with hypoglycemia without coma: Secondary | ICD-10-CM | POA: Diagnosis not present

## 2019-12-17 DIAGNOSIS — I69318 Other symptoms and signs involving cognitive functions following cerebral infarction: Secondary | ICD-10-CM | POA: Diagnosis not present

## 2019-12-17 DIAGNOSIS — E538 Deficiency of other specified B group vitamins: Secondary | ICD-10-CM | POA: Diagnosis not present

## 2019-12-17 DIAGNOSIS — D631 Anemia in chronic kidney disease: Secondary | ICD-10-CM | POA: Diagnosis not present

## 2019-12-17 DIAGNOSIS — I129 Hypertensive chronic kidney disease with stage 1 through stage 4 chronic kidney disease, or unspecified chronic kidney disease: Secondary | ICD-10-CM | POA: Diagnosis not present

## 2019-12-19 ENCOUNTER — Other Ambulatory Visit: Payer: Self-pay | Admitting: Family Medicine

## 2019-12-20 DIAGNOSIS — G9341 Metabolic encephalopathy: Secondary | ICD-10-CM | POA: Diagnosis not present

## 2019-12-20 DIAGNOSIS — I129 Hypertensive chronic kidney disease with stage 1 through stage 4 chronic kidney disease, or unspecified chronic kidney disease: Secondary | ICD-10-CM | POA: Diagnosis not present

## 2019-12-20 DIAGNOSIS — E1122 Type 2 diabetes mellitus with diabetic chronic kidney disease: Secondary | ICD-10-CM | POA: Diagnosis not present

## 2019-12-20 DIAGNOSIS — N1832 Chronic kidney disease, stage 3b: Secondary | ICD-10-CM | POA: Diagnosis not present

## 2019-12-20 DIAGNOSIS — I69318 Other symptoms and signs involving cognitive functions following cerebral infarction: Secondary | ICD-10-CM | POA: Diagnosis not present

## 2019-12-20 DIAGNOSIS — E538 Deficiency of other specified B group vitamins: Secondary | ICD-10-CM | POA: Diagnosis not present

## 2019-12-20 DIAGNOSIS — D631 Anemia in chronic kidney disease: Secondary | ICD-10-CM | POA: Diagnosis not present

## 2019-12-20 DIAGNOSIS — E11649 Type 2 diabetes mellitus with hypoglycemia without coma: Secondary | ICD-10-CM | POA: Diagnosis not present

## 2019-12-30 ENCOUNTER — Other Ambulatory Visit: Payer: Self-pay

## 2020-01-03 ENCOUNTER — Encounter: Payer: Self-pay | Admitting: Internal Medicine

## 2020-01-03 ENCOUNTER — Ambulatory Visit (INDEPENDENT_AMBULATORY_CARE_PROVIDER_SITE_OTHER): Payer: Medicare Other | Admitting: Internal Medicine

## 2020-01-03 ENCOUNTER — Other Ambulatory Visit: Payer: Self-pay

## 2020-01-03 VITALS — BP 118/68 | HR 88 | Temp 98.8°F | Ht 67.0 in | Wt 141.4 lb

## 2020-01-03 DIAGNOSIS — E1065 Type 1 diabetes mellitus with hyperglycemia: Secondary | ICD-10-CM | POA: Diagnosis not present

## 2020-01-03 DIAGNOSIS — E109 Type 1 diabetes mellitus without complications: Secondary | ICD-10-CM | POA: Insufficient documentation

## 2020-01-03 DIAGNOSIS — E1022 Type 1 diabetes mellitus with diabetic chronic kidney disease: Secondary | ICD-10-CM | POA: Diagnosis not present

## 2020-01-03 DIAGNOSIS — E1059 Type 1 diabetes mellitus with other circulatory complications: Secondary | ICD-10-CM | POA: Diagnosis not present

## 2020-01-03 DIAGNOSIS — N184 Chronic kidney disease, stage 4 (severe): Secondary | ICD-10-CM | POA: Diagnosis not present

## 2020-01-03 LAB — GLUCOSE, POCT (MANUAL RESULT ENTRY): POC Glucose: 381 mg/dl — AB (ref 70–99)

## 2020-01-03 MED ORDER — INSULIN LISPRO (1 UNIT DIAL) 100 UNIT/ML (KWIKPEN): PEN_INJECTOR | SUBCUTANEOUS | 11 refills | Status: DC

## 2020-01-03 MED ORDER — LANTUS SOLOSTAR 100 UNIT/ML ~~LOC~~ SOPN: 12.0000 [IU] | PEN_INJECTOR | Freq: Every day | SUBCUTANEOUS | 5 refills | Status: DC

## 2020-01-03 MED ORDER — INSULIN PEN NEEDLE 32G X 4 MM MISC: 1.0000 | Freq: Four times a day (QID) | 11 refills | Status: DC

## 2020-01-03 NOTE — Patient Instructions (Addendum)
-   Change lantus to 12 units daily  - Humalog correctional insulin: Use the scale below to help guide you:   Blood sugar before meal Number of units to inject  Less than 180 0 unit  181 -  240 1 units  240 -  300 2 units  301 -  360 3 units  361 -  420 4 units  421 -  480 5 units  481 -  540 6 units  541 -  600 7 units     HOW TO TREAT LOW BLOOD SUGARS (Blood sugar LESS THAN 70 MG/DL)  Please follow the RULE OF 15 for the treatment of hypoglycemia treatment (when your (blood sugars are less than 70 mg/dL)    STEP 1: Take 15 grams of carbohydrates when your blood sugar is low, which includes:   3-4 GLUCOSE TABS  OR  3-4 OZ OF JUICE OR REGULAR SODA OR  ONE TUBE OF GLUCOSE GEL     STEP 2: RECHECK blood sugar in 15 MINUTES STEP 3: If your blood sugar is still low at the 15 minute recheck --> then, go back to STEP 1 and treat AGAIN with another 15 grams of carbohydrates.

## 2020-01-03 NOTE — Progress Notes (Signed)
Name: [name] [name]  MRN/ DOB: [id number], 06/18/1946   Age/ Sex: 74 y.o., male    PCP: Libby Maw, MD   Reason for Endocrinology Evaluation: Type 1 Diabetes Mellitus     Date of Initial Endocrinology Visit: 01/03/2020     PATIENT IDENTIFIER: [name] [name] is a 74 y.o. male with a past medical history of HTN, T2DM, CKD, depression and cognitive decline. The patient presented for initial endocrinology clinic visit on 01/03/2020 for consultative assistance with his diabetes management.    HPI: [name] is here with his sister [name] -who is his caregiver   Diagnosed with DM > 10 yrs ago per sister, but the patient states he has been diagnosed with diabetes since the age of 21 and has been on insulin since his diagnosis Prior Medications tried/Intolerance: was on insulin  70/30  Currently checking blood sugars 2 x / day,  before meals  Hypoglycemia episodes : yes              Symptoms: lip numbness                 Frequency: rare Hemoglobin A1c has ranged from 9.9% in 2018, peaking at 15.5%in 2020. Patient required assistance for hypoglycemia: no  Patient has required hospitalization within the last 1 year from hyper or hypoglycemia: yes   In terms of diet, the patient 2 meals a day  ( breakfast and dinner )   Pt lives alone but has 2 sisters that check on him, multiple ED visit for hypoglycemia.   Was on insulin mix 70/30 as well as lantus but insulin mix was stopped in 11/2019   Drinks sugar sweetened beverages   HOME DIABETES REGIMEN: Lantus 10 units in the morning and 3 units in the evening   Statin: yes ACE-I/ARB: no     METER DOWNLOAD SUMMARY: Did not bring   DIABETIC COMPLICATIONS: Microvascular complications:   CKD IV  Last eye exam: Completed and known  Macrovascular complications:   CVA  Denies: CAD, PVD   PAST HISTORY: Past Medical History:  Past Medical History:  Diagnosis Date  . 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 Closed comminuted intertrochanteric fracture of left femur ([place])   . Diabetes mellitus without complication ([place])   . Diabetic hyperosmolar non-ketotic state ([place]) 05/15/2016  . DKA (diabetic ketoacidoses) ([place]) 05/09/2016  . Hypertension   . Hypoglycemia 11/29/2019  . Postoperative anemia due to acute blood loss 06/18/2016  . Retroperitoneal hematoma 06/18/2016  . Stroke ([place])   . Vitamin B 12 deficiency 06/18/2016   Past Surgical History:  Past Surgical History:  Procedure Laterality Date  . HIP ARTHROPLASTY Right 02/05/2013   Procedure: ARTHROPLASTY BIPOLAR HIP;  Surgeon: [name], MD;  Location: [facility];  Service: Orthopedics;  Laterality: Right;  . INTRAMEDULLARY (IM) NAIL INTERTROCHANTERIC Left 06/16/2016   Procedure: INTRAMEDULLARY (IM) NAIL INTERTROCHANTRIC;  Surgeon: Rod Can, MD;  Location: [facility];  Service: Orthopedics;  Laterality: Left;      Social History:  reports that he quit smoking about 3 years ago. His smoking use included cigarettes. He has a 7.50 pack-year smoking history. He has never used smokeless tobacco. He reports that he does not drink alcohol or use drugs. Family History:  Family History  Problem Relation Age of Onset  . Alzheimer's disease Mother   . Hypertension Mother   . Hyperlipidemia Father   . Hypertension Father   . Healthy  Maternal Grandmother   . Pneumonia Maternal Grandfather      HOME MEDICATIONS: Allergies as of 01/03/2020   No Known Allergies     Medication List       Accurate as of January 03, 2020 11:25 AM. If you have any questions, ask your nurse or doctor.        Accu-Chek Guide w/Device Kit 1 Device by Does not apply route QID.   amLODipine 10 MG tablet Commonly known as: NORVASC TAKE 1 TABLET ONCE DAILY.   aspirin 81 MG chewable tablet Chew 1 tablet (81 mg total) by mouth daily.   atorvastatin 10 MG tablet Commonly known as: LIPITOR Take 1 tablet (10 mg total) by mouth  daily.   B-D ULTRAFINE III SHORT PEN 31G X 8 MM Misc Generic drug: Insulin Pen Needle USE WITH INJECTIONS   cyanocobalamin 1000 MCG tablet Take 1 tablet (1,000 mcg total) by mouth daily.   FLUoxetine 40 MG capsule Commonly known as: PROZAC Take 1 capsule (40 mg total) by mouth daily.   furosemide 20 MG tablet Commonly known as: LASIX Take 1 tablet (20 mg total) by mouth daily.   glucose blood test strip Commonly known as: True Metrix Blood Glucose Test CHECK BLOOD SUGAR UP TO 4 TIMES A DAY. What changed: additional instructions   glucose blood test strip Commonly known as: Accu-Chek Guide Use as instructed What changed: Another medication with the same name was changed. Make sure you understand how and when to take each.   Accu-Chek Guide test strip Generic drug: glucose blood USE AS DIRECTED What changed: Another medication with the same name was changed. Make sure you understand how and when to take each.   hydrALAZINE 50 MG tablet Commonly known as: APRESOLINE Take 1 tablet (50 mg total) by mouth every 8 (eight) hours.   Insulin Syringe-Needle U-100 31G X 5/16" 0.3 ML Misc Commonly known as: BD Insulin Syringe U/F Inject 1 each as directed 2 (two) times daily. as directed   Lantus SoloStar 100 UNIT/ML Solostar Pen Generic drug: insulin glargine Take 10 units lantus in the morning and 3 units lantus at bedtime.   Vitamin D (Ergocalciferol) 1.25 MG (50000 UNIT) Caps capsule Commonly known as: DRISDOL Take 1 capsule (50,000 Units total) by mouth once a week.        ALLERGIES: No Known Allergies   REVIEW OF SYSTEMS: A comprehensive ROS was conducted with the patient and is negative except as per HPI    OBJECTIVE:   VITAL SIGNS: BP 118/68 (BP Location: Left Arm, Patient Position: Sitting, Cuff Size: Large)   Pulse 88   Temp 98.8 F (37.1 C)   Ht '5\' 7"'  (1.702 m)   Wt 141 lb 6.4 oz (64.1 kg)   SpO2 99%   BMI 22.15 kg/m    PHYSICAL EXAM:  General: Pt  appears well and is in NAD  Neck: General: Supple without adenopathy or carotid bruits. Thyroid: Thyroid size normal.  No goiter or nodules appreciated. No thyroid bruit.  Lungs: Clear with good BS bilat with no rales, rhonchi, or wheezes  Heart: RRR   Extremities:  Lower extremities - No pretibial edema.  Neuro: MS is good with appropriate affect     DATA REVIEWED:  Lab Results  Component Value Date   HGBA1C 10.7 (H) 11/23/2019   HGBA1C 11.5 (A) 11/09/2019   HGBA1C 15.3 (H) 03/16/2019   Lab Results  Component Value Date   LDLCALC 117 (H) 03/16/2019   CREATININE 2.61 (  H) 12/08/2019   No results found for: [facility]  Lab Results  Component Value Date   CHOL 220 (H) 03/16/2019   HDL 61 03/16/2019   LDLCALC 117 (H) 03/16/2019   TRIG 212 (H) 03/16/2019   CHOLHDL 3.6 03/16/2019        ASSESSMENT / PLAN / RECOMMENDATIONS:   1) Type 1 Diabetes Mellitus, poorly controlled, With CKD IV and macro vascular complications - Most recent A1c of 10.7 %. Goal A1c < 8.0%.    -This is a difficult social situation, as the patient has cognitive issues and is dependent on his sisters who do not live with him to provide food, glucose checks, and insulin injections , which is done twice a day. -The sister tells me that due to fear of hypoglycemia from past experience she has been guessing on giving him the basal insulin based on his sugars.  She provides an example last night where she took vegetables for dinner for him, the patient did not eat the vegetables but drank sugar sweetened soda and by the time she checked it last night it was over 500 mg/DL, the sister gave him 6 units of Lantus (rather than the 3 units that is prescribed at night) and his BG this a.m. was 380 MGs/DL -I did discuss with the sister the dangers of guessing on insulin dose, we also discussed that in type 1 diabetes we typically have a standing dose of prandial insulin to be given with meals, the sister stated that she is  not comfortable doing that with her brother as he has an predictable eating habits, as well as her concerns about hypoglycemia. - Discussed pharmacokinetics of basal/bolus insulin, I tried to explain to her that basal insulin is not used for correcting severe hyperglycemia, and that we would use prandial insulin for this purpose due to rapid mechanism of action. - We also discussed avoiding sugar-sweetened beverages , but the patient does not want to try any diet drinks and would like to continue on sugar sweetened beverages. -Given the above I will goal in this situation is to avoid hypoglycemia and to avoid severe hyperglycemia. -I again advised the sister not to adjust his Lantus dose and to use the correction scale that will be provided today during the day for hypoglycemia if needed. -I also have asked her to change the Lantus to once daily dosing   MEDICATIONS: -Change Lantus to 12 units once daily -Correction scale: Humalog  ( BG-120/60) at mealtimes  EDUCATION / INSTRUCTIONS:  BG monitoring instructions: Patient is instructed to check his blood sugars 3 times a day, before meals.  Call [facility] if: BG persistently < 70 or > 300. . I reviewed the Rule of 15 for the treatment of hypoglycemia in detail with the patient. Literature supplied.  Follow-up in 3 months   Signed electronically by: Mack Guise, MD  [facility] End[facility]  [address] [place], [place] [address] Phone: [phone] FAX: [phone]   CC: Libby Maw, [place] [address] Phone: [phone]  Fax: [phone]    Return to Endocrinology clinic as below: Future Appointments  Date Time Provider [place]  02/07/2020  8:20 AM Azzie Glatter, [name] [facility] None

## 2020-01-04 DIAGNOSIS — N1832 Chronic kidney disease, stage 3b: Secondary | ICD-10-CM | POA: Diagnosis not present

## 2020-01-04 DIAGNOSIS — E1122 Type 2 diabetes mellitus with diabetic chronic kidney disease: Secondary | ICD-10-CM | POA: Diagnosis not present

## 2020-01-04 DIAGNOSIS — D631 Anemia in chronic kidney disease: Secondary | ICD-10-CM | POA: Diagnosis not present

## 2020-01-04 DIAGNOSIS — I69318 Other symptoms and signs involving cognitive functions following cerebral infarction: Secondary | ICD-10-CM | POA: Diagnosis not present

## 2020-01-04 DIAGNOSIS — E538 Deficiency of other specified B group vitamins: Secondary | ICD-10-CM | POA: Diagnosis not present

## 2020-01-04 DIAGNOSIS — I129 Hypertensive chronic kidney disease with stage 1 through stage 4 chronic kidney disease, or unspecified chronic kidney disease: Secondary | ICD-10-CM | POA: Diagnosis not present

## 2020-01-04 DIAGNOSIS — E11649 Type 2 diabetes mellitus with hypoglycemia without coma: Secondary | ICD-10-CM | POA: Diagnosis not present

## 2020-01-04 DIAGNOSIS — G9341 Metabolic encephalopathy: Secondary | ICD-10-CM | POA: Diagnosis not present

## 2020-01-30 ENCOUNTER — Telehealth: Payer: Self-pay | Admitting: Family Medicine

## 2020-01-30 NOTE — Telephone Encounter (Signed)
[name] [name] is calling to let Dr. Ethelene Hal know that the PAD screening report showed right foot 0.86 moderate and left foot 0.86 moderate. CB is [phone]

## 2020-01-31 NOTE — Telephone Encounter (Signed)
Please see message . Thank you .

## 2020-02-02 DIAGNOSIS — I739 Peripheral vascular disease, unspecified: Secondary | ICD-10-CM | POA: Insufficient documentation

## 2020-02-07 ENCOUNTER — Ambulatory Visit: Payer: Medicare Other | Admitting: Family Medicine

## 2020-02-10 ENCOUNTER — Other Ambulatory Visit: Payer: Self-pay | Admitting: Family Medicine

## 2020-03-16 ENCOUNTER — Other Ambulatory Visit: Payer: Self-pay | Admitting: Family Medicine

## 2020-03-22 ENCOUNTER — Other Ambulatory Visit: Payer: Self-pay | Admitting: Family Medicine

## 2020-03-24 ENCOUNTER — Inpatient Hospital Stay (HOSPITAL_COMMUNITY)
Admission: AD | Admit: 2020-03-24 | Discharge: 2020-03-29 | DRG: 638 | Disposition: A | Discharge status: Skilled Nursing Facility | Payer: Medicare Other | Attending: Family Medicine | Admitting: Family Medicine

## 2020-03-24 ENCOUNTER — Other Ambulatory Visit: Payer: Self-pay | Admitting: Family Medicine

## 2020-03-24 ENCOUNTER — Emergency Department (HOSPITAL_COMMUNITY): Payer: Medicare Other

## 2020-03-24 ENCOUNTER — Other Ambulatory Visit: Payer: Self-pay

## 2020-03-24 ENCOUNTER — Inpatient Hospital Stay (HOSPITAL_COMMUNITY): Payer: Medicare Other

## 2020-03-24 DIAGNOSIS — Z820 Family history of epilepsy and other diseases of the nervous system: Secondary | ICD-10-CM | POA: Diagnosis not present

## 2020-03-24 DIAGNOSIS — F0390 Unspecified dementia without behavioral disturbance: Secondary | ICD-10-CM | POA: Diagnosis present

## 2020-03-24 DIAGNOSIS — I639 Cerebral infarction, unspecified: Secondary | ICD-10-CM

## 2020-03-24 DIAGNOSIS — N179 Acute kidney failure, unspecified: Secondary | ICD-10-CM | POA: Diagnosis present

## 2020-03-24 DIAGNOSIS — E109 Type 1 diabetes mellitus without complications: Secondary | ICD-10-CM | POA: Diagnosis present

## 2020-03-24 DIAGNOSIS — G9341 Metabolic encephalopathy: Secondary | ICD-10-CM | POA: Diagnosis present

## 2020-03-24 DIAGNOSIS — F419 Anxiety disorder, unspecified: Secondary | ICD-10-CM | POA: Diagnosis present

## 2020-03-24 DIAGNOSIS — Z515 Encounter for palliative care: Secondary | ICD-10-CM | POA: Diagnosis not present

## 2020-03-24 DIAGNOSIS — F1010 Alcohol abuse, uncomplicated: Secondary | ICD-10-CM | POA: Diagnosis present

## 2020-03-24 DIAGNOSIS — E1100 Type 2 diabetes mellitus with hyperosmolarity without nonketotic hyperglycemic-hyperosmolar coma (NKHHC): Principal | ICD-10-CM | POA: Diagnosis present

## 2020-03-24 DIAGNOSIS — R7989 Other specified abnormal findings of blood chemistry: Secondary | ICD-10-CM

## 2020-03-24 DIAGNOSIS — E1059 Type 1 diabetes mellitus with other circulatory complications: Secondary | ICD-10-CM

## 2020-03-24 DIAGNOSIS — E1151 Type 2 diabetes mellitus with diabetic peripheral angiopathy without gangrene: Secondary | ICD-10-CM | POA: Diagnosis present

## 2020-03-24 DIAGNOSIS — E1310 Other specified diabetes mellitus with ketoacidosis without coma: Secondary | ICD-10-CM | POA: Diagnosis not present

## 2020-03-24 DIAGNOSIS — E876 Hypokalemia: Secondary | ICD-10-CM | POA: Diagnosis not present

## 2020-03-24 DIAGNOSIS — R531 Weakness: Secondary | ICD-10-CM | POA: Diagnosis not present

## 2020-03-24 DIAGNOSIS — E870 Hyperosmolality and hypernatremia: Secondary | ICD-10-CM

## 2020-03-24 DIAGNOSIS — Z7189 Other specified counseling: Secondary | ICD-10-CM | POA: Diagnosis not present

## 2020-03-24 DIAGNOSIS — I130 Hypertensive heart and chronic kidney disease with heart failure and stage 1 through stage 4 chronic kidney disease, or unspecified chronic kidney disease: Secondary | ICD-10-CM | POA: Diagnosis present

## 2020-03-24 DIAGNOSIS — E538 Deficiency of other specified B group vitamins: Secondary | ICD-10-CM | POA: Diagnosis present

## 2020-03-24 DIAGNOSIS — F329 Major depressive disorder, single episode, unspecified: Secondary | ICD-10-CM | POA: Diagnosis present

## 2020-03-24 DIAGNOSIS — R296 Repeated falls: Secondary | ICD-10-CM | POA: Diagnosis present

## 2020-03-24 DIAGNOSIS — E1110 Type 2 diabetes mellitus with ketoacidosis without coma: Secondary | ICD-10-CM | POA: Diagnosis not present

## 2020-03-24 DIAGNOSIS — Z9114 Patient's other noncompliance with medication regimen: Secondary | ICD-10-CM

## 2020-03-24 DIAGNOSIS — N184 Chronic kidney disease, stage 4 (severe): Secondary | ICD-10-CM | POA: Diagnosis present

## 2020-03-24 DIAGNOSIS — Z794 Long term (current) use of insulin: Secondary | ICD-10-CM

## 2020-03-24 DIAGNOSIS — Z79899 Other long term (current) drug therapy: Secondary | ICD-10-CM

## 2020-03-24 DIAGNOSIS — Z8673 Personal history of transient ischemic attack (TIA), and cerebral infarction without residual deficits: Secondary | ICD-10-CM

## 2020-03-24 DIAGNOSIS — Z8249 Family history of ischemic heart disease and other diseases of the circulatory system: Secondary | ICD-10-CM

## 2020-03-24 DIAGNOSIS — E11649 Type 2 diabetes mellitus with hypoglycemia without coma: Secondary | ICD-10-CM | POA: Diagnosis not present

## 2020-03-24 DIAGNOSIS — Z87891 Personal history of nicotine dependence: Secondary | ICD-10-CM

## 2020-03-24 DIAGNOSIS — Z20822 Contact with and (suspected) exposure to covid-19: Secondary | ICD-10-CM | POA: Diagnosis present

## 2020-03-24 DIAGNOSIS — Z86718 Personal history of other venous thrombosis and embolism: Secondary | ICD-10-CM | POA: Diagnosis not present

## 2020-03-24 DIAGNOSIS — I5032 Chronic diastolic (congestive) heart failure: Secondary | ICD-10-CM | POA: Diagnosis present

## 2020-03-24 LAB — COMPREHENSIVE METABOLIC PANEL
ALT: 14 U/L (ref 0–44)
AST: 17 U/L (ref 15–41)
Albumin: 2.9 g/dL — ABNORMAL LOW (ref 3.5–5.0)
Alkaline Phosphatase: 60 U/L (ref 38–126)
BUN: 54 mg/dL — ABNORMAL HIGH (ref 8–23)
CO2: <7 mmol/L — ABNORMAL LOW (ref 22–32)
Calcium: 8.3 mg/dL — ABNORMAL LOW (ref 8.9–10.3)
Chloride: 98 mmol/L (ref 98–111)
Creatinine, Ser: 5.63 mg/dL — ABNORMAL HIGH (ref 0.61–1.24)
GFR calc Af Amer: 11 mL/min — ABNORMAL LOW (ref >60)
GFR calc non Af Amer: 9 mL/min — ABNORMAL LOW (ref >60)
Glucose, Bld: 876 mg/dL (ref 70–99)
Potassium: 4.2 mmol/L (ref 3.5–5.1)
Sodium: 130 mmol/L — ABNORMAL LOW (ref 135–145)
Total Bilirubin: 1.3 mg/dL — ABNORMAL HIGH (ref 0.3–1.2)
Total Protein: 6.1 g/dL — ABNORMAL LOW (ref 6.5–8.1)

## 2020-03-24 LAB — DIFFERENTIAL
Abs Immature Granulocytes: 0.07 10*3/uL (ref 0.00–0.07)
Basophils Absolute: 0.1 10*3/uL (ref 0.0–0.1)
Basophils Relative: 0 %
Eosinophils Absolute: 0.1 10*3/uL (ref 0.0–0.5)
Eosinophils Relative: 1 %
Immature Granulocytes: 1 %
Lymphocytes Relative: 9 %
Lymphs Abs: 1.2 10*3/uL (ref 0.7–4.0)
Monocytes Absolute: 0.6 10*3/uL (ref 0.1–1.0)
Monocytes Relative: 5 %
Neutro Abs: 10.4 10*3/uL — ABNORMAL HIGH (ref 1.7–7.7)
Neutrophils Relative %: 84 %

## 2020-03-24 LAB — I-STAT VENOUS BLOOD GAS, ED
Acid-base deficit: 23 mmol/L — ABNORMAL HIGH (ref 0.0–2.0)
Bicarbonate: 6.1 mmol/L — ABNORMAL LOW (ref 20.0–28.0)
Calcium, Ion: 1.07 mmol/L — ABNORMAL LOW (ref 1.15–1.40)
HCT: 38 % — ABNORMAL LOW (ref 39.0–52.0)
Hemoglobin: 12.9 g/dL — ABNORMAL LOW (ref 13.0–17.0)
O2 Saturation: 97 %
Potassium: 4.2 mmol/L (ref 3.5–5.1)
Sodium: 131 mmol/L — ABNORMAL LOW (ref 135–145)
TCO2: 7 mmol/L — ABNORMAL LOW (ref 22–32)
pCO2, Ven: 22.6 mmHg — ABNORMAL LOW (ref 44.0–60.0)
pH, Ven: 7.037 — CL (ref 7.250–7.430)
pO2, Ven: 122 mmHg — ABNORMAL HIGH (ref 32.0–45.0)

## 2020-03-24 LAB — CBG MONITORING, ED
Glucose-Capillary: >600 mg/dL (ref 70–99)
Glucose-Capillary: >600 mg/dL (ref 70–99)
Glucose-Capillary: >600 mg/dL (ref 70–99)
Glucose-Capillary: 518 mg/dL (ref 70–99)
Glucose-Capillary: >600 mg/dL (ref 70–99)
Glucose-Capillary: 483 mg/dL — ABNORMAL HIGH (ref 70–99)
Glucose-Capillary: 468 mg/dL — ABNORMAL HIGH (ref 70–99)
Glucose-Capillary: 384 mg/dL — ABNORMAL HIGH (ref 70–99)

## 2020-03-24 LAB — GLUCOSE, CAPILLARY
Glucose-Capillary: 241 mg/dL — ABNORMAL HIGH (ref 70–99)
Glucose-Capillary: 149 mg/dL — ABNORMAL HIGH (ref 70–99)

## 2020-03-24 LAB — CBC
HCT: 34.9 % — ABNORMAL LOW (ref 39.0–52.0)
Hemoglobin: 10.5 g/dL — ABNORMAL LOW (ref 13.0–17.0)
MCH: 30.1 pg (ref 26.0–34.0)
MCHC: 30.1 g/dL (ref 30.0–36.0)
MCV: 100 fL (ref 80.0–100.0)
Platelets: 299 10*3/uL (ref 150–400)
RBC: 3.49 MIL/uL — ABNORMAL LOW (ref 4.22–5.81)
RDW: 14.2 % (ref 11.5–15.5)
WBC: 12.3 10*3/uL — ABNORMAL HIGH (ref 4.0–10.5)
nRBC: 0 % (ref 0.0–0.2)

## 2020-03-24 LAB — I-STAT CHEM 8, ED
BUN: 68 mg/dL — ABNORMAL HIGH (ref 8–23)
Calcium, Ion: 0.84 mmol/L — CL (ref 1.15–1.40)
Chloride: 111 mmol/L (ref 98–111)
Creatinine, Ser: 5.5 mg/dL — ABNORMAL HIGH (ref 0.61–1.24)
Glucose, Bld: >700 mg/dL (ref 70–99)
HCT: 33 % — ABNORMAL LOW (ref 39.0–52.0)
Hemoglobin: 11.2 g/dL — ABNORMAL LOW (ref 13.0–17.0)
Potassium: 5 mmol/L (ref 3.5–5.1)
Sodium: 125 mmol/L — ABNORMAL LOW (ref 135–145)
TCO2: 6 mmol/L — ABNORMAL LOW (ref 22–32)

## 2020-03-24 LAB — URINALYSIS, ROUTINE W REFLEX MICROSCOPIC
Bilirubin Urine: NEGATIVE
Glucose, UA: >=500 mg/dL — AB
Ketones, ur: 20 mg/dL — AB
Leukocytes,Ua: NEGATIVE
Nitrite: NEGATIVE
Protein, ur: >=300 mg/dL — AB
Specific Gravity, Urine: 1.017 (ref 1.005–1.030)
pH: 5 (ref 5.0–8.0)

## 2020-03-24 LAB — BASIC METABOLIC PANEL
BUN: 53 mg/dL — ABNORMAL HIGH (ref 8–23)
CO2: <7 mmol/L — ABNORMAL LOW (ref 22–32)
Calcium: 7.8 mg/dL — ABNORMAL LOW (ref 8.9–10.3)
Chloride: 101 mmol/L (ref 98–111)
Creatinine, Ser: 5.39 mg/dL — ABNORMAL HIGH (ref 0.61–1.24)
GFR calc Af Amer: 11 mL/min — ABNORMAL LOW (ref >60)
GFR calc non Af Amer: 10 mL/min — ABNORMAL LOW (ref >60)
Glucose, Bld: 809 mg/dL (ref 70–99)
Potassium: 4.2 mmol/L (ref 3.5–5.1)
Sodium: 130 mmol/L — ABNORMAL LOW (ref 135–145)

## 2020-03-24 LAB — HEMOGLOBIN A1C
Hgb A1c MFr Bld: 10.3 % — ABNORMAL HIGH (ref 4.8–5.6)
Mean Plasma Glucose: 248.91 mg/dL

## 2020-03-24 LAB — ETHANOL: Alcohol, Ethyl (B): <10 mg/dL (ref <10)

## 2020-03-24 LAB — OSMOLALITY: Osmolality: 339 mOsm/kg (ref 275–295)

## 2020-03-24 LAB — PROTIME-INR
INR: 1.2 (ref 0.8–1.2)
Prothrombin Time: 14.3 seconds (ref 11.4–15.2)

## 2020-03-24 LAB — APTT: aPTT: 27 seconds (ref 24–36)

## 2020-03-24 LAB — SARS CORONAVIRUS 2 BY RT PCR (HOSPITAL ORDER, PERFORMED IN ~~LOC~~ HOSPITAL LAB): SARS Coronavirus 2: NEGATIVE

## 2020-03-24 MED ORDER — DEXTROSE-NACL 5-0.45 % IV SOLN: INTRAVENOUS | Status: DC

## 2020-03-24 MED ORDER — POTASSIUM CHLORIDE 10 MEQ/100ML IV SOLN
10.0000 meq | INTRAVENOUS | Status: AC
  Administered 2020-03-24 (×2): 10 meq via INTRAVENOUS
  Filled 2020-03-24 (×2): qty 100

## 2020-03-24 MED ORDER — HYDRALAZINE HCL 20 MG/ML IJ SOLN: 5.0000 mg | INTRAMUSCULAR | Status: DC | PRN

## 2020-03-24 MED ORDER — IOHEXOL 350 MG/ML SOLN
75.0000 mL | Freq: Once | INTRAVENOUS | Status: AC | PRN
  Administered 2020-03-24: 60 mL via INTRAVENOUS

## 2020-03-24 MED ORDER — ACETAMINOPHEN 650 MG RE SUPP: 650.0000 mg | RECTAL | Status: DC | PRN

## 2020-03-24 MED ORDER — LACTATED RINGERS IV BOLUS
1000.0000 mL | Freq: Once | INTRAVENOUS | Status: AC
  Administered 2020-03-24: 1000 mL via INTRAVENOUS

## 2020-03-24 MED ORDER — INSULIN REGULAR(HUMAN) IN NACL 100-0.9 UT/100ML-% IV SOLN
INTRAVENOUS | Status: DC
  Administered 2020-03-24: 8 [IU]/h via INTRAVENOUS
  Filled 2020-03-24 (×2): qty 100

## 2020-03-24 MED ORDER — SODIUM CHLORIDE 0.9 % IV BOLUS (SEPSIS)
1000.0000 mL | Freq: Once | INTRAVENOUS | Status: AC
  Administered 2020-03-24: 1000 mL via INTRAVENOUS

## 2020-03-24 MED ORDER — FENTANYL CITRATE (PF) 100 MCG/2ML IJ SOLN
12.5000 ug | Freq: Once | INTRAMUSCULAR | Status: AC
  Administered 2020-03-24: 12.5 ug via INTRAVENOUS
  Filled 2020-03-24: qty 2

## 2020-03-24 MED ORDER — SODIUM CHLORIDE 0.9 % IV SOLN: INTRAVENOUS | Status: DC

## 2020-03-24 MED ORDER — ACETAMINOPHEN 160 MG/5ML PO SOLN: 650.0000 mg | ORAL | Status: DC | PRN

## 2020-03-24 MED ORDER — HEPARIN SODIUM (PORCINE) 5000 UNIT/ML IJ SOLN
5000.0000 [IU] | Freq: Three times a day (TID) | INTRAMUSCULAR | Status: DC
  Administered 2020-03-24 – 2020-03-29 (×14): 5000 [IU] via SUBCUTANEOUS
  Filled 2020-03-24 (×14): qty 1

## 2020-03-24 MED ORDER — STROKE: EARLY STAGES OF RECOVERY BOOK
Freq: Once | Status: AC
  Filled 2020-03-24: qty 1

## 2020-03-24 MED ORDER — DEXTROSE 50 % IV SOLN: 0.0000 mL | INTRAVENOUS | Status: DC | PRN

## 2020-03-24 MED ORDER — LORAZEPAM 2 MG/ML IJ SOLN
INTRAMUSCULAR | Status: AC
  Administered 2020-03-24 (×2): 0.5 mg
  Filled 2020-03-24: qty 1

## 2020-03-24 MED ORDER — ACETAMINOPHEN 325 MG PO TABS: 650.0000 mg | ORAL_TABLET | ORAL | Status: DC | PRN

## 2020-03-24 MED ORDER — SODIUM CHLORIDE 0.9 % IV SOLN
1000.0000 mL | INTRAVENOUS | Status: DC
  Administered 2020-03-24: 1000 mL via INTRAVENOUS

## 2020-03-24 MED ORDER — SODIUM CHLORIDE 0.9% FLUSH: 3.0000 mL | Freq: Once | INTRAVENOUS | Status: DC

## 2020-03-24 NOTE — ED Provider Notes (Signed)
[facility] EMERGENCY DEPARTMENT Provider Note   CSN: [id number] Arrival date & time: 03/24/20  1139  An emergency department physician performed an initial assessment on this suspected stroke patient at 1141.  History Chief complaint acute stroke  [name] [name] is a 74 y.o. male.  HPI   Patient presented to the ED as an acute stroke.  EMS activated the patient as a code stroke.  They noted right sided gaze and left-sided weakness.  Patient's last known well was yesterday at 39 when he left his sister's house.  When family called him this morning he noticed that his speech was slurred.  Patient noticed feeling weird when he woke up this morning.  Past Medical History:  Diagnosis Date  . 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 Closed comminuted intertrochanteric fracture of left femur ([place])   . Diabetes mellitus without complication ([place])   . Diabetic hyperosmolar non-ketotic state ([place]) 05/15/2016  . DKA (diabetic ketoacidoses) ([place]) 05/09/2016  . Hypertension   . Hypoglycemia 11/29/2019  . Postoperative anemia due to acute blood loss 06/18/2016  . Retroperitoneal hematoma 06/18/2016  . Stroke ([place])   . Vitamin B 12 deficiency 06/18/2016    Patient Active Problem List   Diagnosis Date Noted  . PVD (peripheral vascular disease) ([place]) 02/02/2020  . Diabetes mellitus type I ([place]) 01/03/2020  . Type 1 diabetes mellitus with hyperglycemia ([place]) 01/03/2020  . 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 History of hypoglycemia 12/08/2019  . Poor social situation 12/08/2019  . CKD (chronic kidney disease) stage 4, GFR 15-29 ml/min ([name]) 12/08/2019  . Hypoglycemia 11/28/2019  . CVA (cerebral vascular accident) ([place]) 11/22/2019  . Cognitive decline 11/22/2019  . Metabolic encephalopathy 26/37/8588  . ETOH abuse 05/17/2019  . Chronic diastolic CHF (congestive heart failure) ([place])  05/17/2019  . DKA (diabetic ketoacidoses) ([place]) 05/17/2019  . Fall 10/03/2018  . Uncontrolled type 2 diabetes mellitus with hyperglycemia, with long-term current use of insulin ([place]) 10/03/2018  . Chest wall contusion 10/03/2018  . Underweight 11/18/2017  . Anemia 11/18/2017  . DKA, type 2 ([place]) 08/01/2017  . History of cerebrovascular accident (CVA) in adulthood 07/25/2017  . HTN (hypertension) 07/25/2017  . Depression with anxiety 07/25/2017  . Left ventricular diastolic dysfunction 50/27/7412  . Dementia ([place]) 07/25/2017  . Brittle diabetes mellitus ([place]) 06/28/2017  . Hypoglycemia due to insulin 06/27/2017  . Diabetes mellitus, insulin dependent (IDDM), controlled 06/27/2017  . Hypertension 06/27/2017  . Hyperkalemia 01/16/2017  . Acute metabolic encephalopathy 87/86/7672  . Weakness 11/16/2016  . Acute kidney injury superimposed on chronic kidney disease ([place])   . Noncompliance with medication regimen 08/17/2016  . History of DVT (deep vein thrombosis) 07/06/2016  . Vitamin B 12 deficiency 06/18/2016  . Hyperglycemia 05/15/2016  . Diabetic hyperosmolar non-ketotic state ([place]) 05/15/2016  . Hyponatremia 05/15/2016  . Anxiety 05/15/2016  . Narcotic dependency, continuous ([place]) 11/02/2015  . Uncontrolled type 2 diabetes mellitus with diabetic nephropathy, with long-term current use of insulin ([place]) 08/12/2015  . Depression 08/12/2015  . CKD (chronic kidney disease) stage 3, GFR 30-59 ml/min ([name]) 03/20/2014    Past Surgical History:  Procedure Laterality Date  . HIP ARTHROPLASTY Right 02/05/2013   Procedure: ARTHROPLASTY BIPOLAR HIP;  Surgeon: [name], MD;  Location: [facility];  Service: Orthopedics;  Laterality: Right;  . INTRAMEDULLARY (IM) NAIL INTERTROCHANTERIC Left 06/16/2016   Procedure: INTRAMEDULLARY (IM) NAIL INTERTROCHANTRIC;  Surgeon:  Rod Can, MD;  Location: [facility];  Service: Orthopedics;  Laterality: Left;       Family History  Problem Relation Age  of Onset  . Alzheimer's disease Mother   . Hypertension Mother   . Hyperlipidemia Father   . Hypertension Father   . Healthy Maternal Grandmother   . Pneumonia Maternal Grandfather     Social History   Tobacco Use  . Smoking status: Former Smoker    Packs/day: 0.25    Years: 30.00    Pack years: 7.50    Types: Cigarettes    Quit date: 02/01/2016    Years since quitting: 4.1  . Smokeless tobacco: Never Used  Vaping Use  . Vaping Use: Never used  Substance Use Topics  . Alcohol use: No  . Drug use: No    Home Medications Prior to Admission medications   Medication Sig Start Date End Date Taking? Authorizing Provider  amLODipine (NORVASC) 10 MG tablet TAKE 1 TABLET ONCE DAILY. Patient taking differently: Take 10 mg by mouth daily.  09/28/19  Yes Azzie Glatter, [name]  atorvastatin (LIPITOR) 10 MG tablet Take 1 tablet (10 mg total) by mouth daily. 12/04/19  Yes [name], MD  cholecalciferol (VITAMIN D3) 25 MCG (1000 UNIT) tablet Take 1,000 Units by mouth daily.   Yes [provider]  hydrALAZINE (APRESOLINE) 50 MG tablet Take 1 tablet (50 mg total) by mouth every 8 (eight) hours. 12/04/19  Yes [name], MD  insulin glargine (LANTUS SOLOSTAR) 100 UNIT/ML Solostar Pen Inject 12 Units into the skin daily. 01/03/20  Yes [name], [name], MD  insulin lispro (HUMALOG KWIKPEN) 100 UNIT/ML KwikPen Max daily 24 units Per correctional scale Patient taking differently: Inject 1-7 Units into the skin 3 (three) times daily. Max daily 24 units Per correctional scale 01/03/20  Yes [name], [name], MD  vitamin B-12 1000 MCG tablet Take 1 tablet (1,000 mcg total) by mouth daily. 12/05/19  Yes [name], MD  ACCU-CHEK GUIDE test strip USE AS DIRECTED 12/19/19   Azzie Glatter, [name]  Blood Glucose Monitoring Suppl (ACCU-CHEK GUIDE) w/Device KIT 1 Device by Does not apply route QID. Patient not taking: Reported on 01/03/2020 11/02/19   Azzie Glatter, [name]   FLUoxetine (PROZAC) 40 MG capsule Take 1 capsule (40 mg total) by mouth daily. Patient not taking: Reported on 03/24/2020 11/09/19   Azzie Glatter, [name]  furosemide (LASIX) 20 MG tablet Take 1 tablet (20 mg total) by mouth daily. 12/05/19   [name], MD  glucose blood (ACCU-CHEK GUIDE) test strip Use as instructed 11/03/18   Azzie Glatter, [name]  glucose blood (TRUE METRIX BLOOD GLUCOSE TEST) test strip CHECK BLOOD SUGAR UP TO 4 TIMES A DAY. Patient taking differently: CHECK BLOOD SUGAR UP TO 2 TIMES A DAY. 05/25/18   Azzie Glatter, [name]  Insulin Pen Needle 32G X 4 MM MISC 1 Device by Does not apply route in the morning, at noon, in the evening, and at bedtime. 01/03/20   [name], [name], MD  Insulin Syringe-Needle U-100 (BD INSULIN SYRINGE U/F) 31G X 5/16" 0.3 ML MISC Inject 1 each as directed 2 (two) times daily. as directed 11/02/19   Azzie Glatter, [name]  Vitamin D, Ergocalciferol, (DRISDOL) 1.25 MG (50000 UNIT) CAPS capsule Take 1 capsule (50,000 Units total) by mouth once a week. Patient not taking: Reported on 03/24/2020 11/09/19   Azzie Glatter, [name]    Allergies  Patient has no known allergies.  Review of Systems   Review of Systems  All other systems reviewed and are negative.   Physical Exam Updated Vital Signs BP (!) 159/65   Pulse 99   Temp (!) 96.6 F (35.9 C) (Tympanic)   Resp (!) 25   SpO2 100%   Physical Exam Vitals and nursing note reviewed.  Constitutional:      General: He is not in acute distress.    Appearance: He is well-developed.  HENT:     Head: Normocephalic and atraumatic.     Right Ear: External ear normal.     Left Ear: External ear normal.  Eyes:     General: No scleral icterus.       Right eye: No discharge.        Left eye: No discharge.     Conjunctiva/sclera: Conjunctivae normal.  Neck:     Trachea: No tracheal deviation.  Cardiovascular:     Rate and Rhythm: Normal rate and regular rhythm.     Pulses: Normal  pulses.     Heart sounds: Normal heart sounds.  Pulmonary:     Effort: Pulmonary effort is normal. No respiratory distress.     Breath sounds: No stridor.  Abdominal:     General: There is no distension.  Musculoskeletal:        General: No swelling or deformity.     Cervical back: Neck supple.  Skin:    General: Skin is warm and dry.     Findings: No rash.  Neurological:     Mental Status: He is alert.     Cranial Nerves: Cranial nerve deficit: Right-sided gaze.     Comments: Left-sided weakness     ED Results / Procedures / Treatments   Labs (all labs ordered are listed, but only abnormal results are displayed) Labs Reviewed  CBC - Abnormal; Notable for the following components:      Result Value   WBC 12.3 (*)    RBC 3.49 (*)    Hemoglobin 10.5 (*)    HCT 34.9 (*)    All other components within normal limits  DIFFERENTIAL - Abnormal; Notable for the following components:   Neutro Abs 10.4 (*)    All other components within normal limits  COMPREHENSIVE METABOLIC PANEL - Abnormal; Notable for the following components:   Sodium 130 (*)    CO2 <7 (*)    Glucose, Bld 876 (*)    BUN 54 (*)    Creatinine, Ser 5.63 (*)    Calcium 8.3 (*)    Total Protein 6.1 (*)    Albumin 2.9 (*)    Total Bilirubin 1.3 (*)    GFR calc non Af Amer 9 (*)    GFR calc Af Amer 11 (*)    All other components within normal limits  I-STAT CHEM 8, ED - Abnormal; Notable for the following components:   Sodium 125 (*)    BUN 68 (*)    Creatinine, Ser 5.50 (*)    Glucose, Bld >700 (*)    Calcium, Ion 0.84 (*)    TCO2 6 (*)    Hemoglobin 11.2 (*)    HCT 33.0 (*)    All other components within normal limits  CBG MONITORING, ED - Abnormal; Notable for the following components:   Glucose-Capillary >600 (*)    All other components within normal limits  CBG MONITORING, ED - Abnormal; Notable for the following components:   Glucose-Capillary >600 (*)  All other components within normal limits   SARS CORONAVIRUS 2 BY RT PCR (HOSPITAL ORDER, [facility])  PROTIME-INR  APTT  BASIC METABOLIC PANEL  BASIC METABOLIC PANEL  BASIC METABOLIC PANEL  BASIC METABOLIC PANEL  OSMOLALITY  URINALYSIS, ROUTINE W REFLEX MICROSCOPIC  I-STAT VENOUS BLOOD GAS, ED    EKG EKG Interpretation  Date/Time:  Saturday March 24 2020 12:32:30 EDT Ventricular Rate:  111 PR Interval:    QRS Duration: 102 QT Interval:  364 QTC Calculation: 495 R Axis:   -37 Text Interpretation: Sinus tachycardia Multiform ventricular premature complexes , new since last tracing Probable left atrial enlargement Left ventricular hypertrophy Anterior infarct, old Nonspecific T abnormalities, lateral leads Baseline wander in lead(s) V5 Confirmed by Dorie Rank [phone]) on 03/24/2020 1:28:15 PM   Radiology CT Code Stroke CTA Head W/WO contrast  Result Date: 03/24/2020 CLINICAL DATA:  Left-sided weakness EXAM: CT ANGIOGRAPHY HEAD AND NECK TECHNIQUE: Multidetector CT imaging of the head and neck was performed using the standard protocol during bolus administration of intravenous contrast. Multiplanar CT image reconstructions and MIPs were obtained to evaluate the vascular anatomy. Carotid stenosis measurements (when applicable) are obtained utilizing NASCET criteria, using the distal internal carotid diameter as the denominator. CONTRAST:  Dose is not yet known COMPARISON:  Noncontrast head CT from earlier today FINDINGS: CTA NECK FINDINGS Aortic arch: Atherosclerotic plaque.  Three vessel branching. Right carotid system: Tortuous vessels. Overall mild atherosclerosis. No flow limiting stenosis or ulceration. Left carotid system: Mild mainly calcified plaque at the bifurcation. ICA tortuosity at the skull base. No flow limiting stenosis or ulceration. Vertebral arteries: No proximal subclavian stenosis. Limited assessment of vertebral origins due to image blurring. Bilateral vertebral tortuosity without flow  limiting stenosis or beading. Skeleton: Advanced degenerative disc narrowing and ridging with C2-3 anterolisthesis. Other neck: No acute finding. Upper chest: Negative Review of the MIP images confirms the above findings CTA HEAD FINDINGS Anterior circulation: Atheromatous calcification along the carotid siphons. Mild scattered atheromatous irregularity of bilateral MCA branches. No branch occlusion, beading, or aneurysm. Posterior circulation: Bilateral V4 segment atheromatous plaque. The vertebrobasilar arteries are diffusely patent. Moderate right distal P2 segment narrowing. Left PCA branch narrowings. Venous sinuses: Unremarkable in the arterial phase Anatomic variants: None significant Review of the MIP images confirms the above findings IMPRESSION: 1. Negative for large vessel occlusion or other emergent finding. 2. Cervical atherosclerosis without flow limiting stenosis of major neck vessels. 3. Intracranial atherosclerosis, most notably a moderate right P2 segment stenosis. Electronically Signed   By: Monte Fantasia M.D.   On: 03/24/2020 12:38   CT Code Stroke CTA Neck W/WO contrast  Result Date: 03/24/2020 CLINICAL DATA:  Left-sided weakness EXAM: CT ANGIOGRAPHY HEAD AND NECK TECHNIQUE: Multidetector CT imaging of the head and neck was performed using the standard protocol during bolus administration of intravenous contrast. Multiplanar CT image reconstructions and MIPs were obtained to evaluate the vascular anatomy. Carotid stenosis measurements (when applicable) are obtained utilizing NASCET criteria, using the distal internal carotid diameter as the denominator. CONTRAST:  Dose is not yet known COMPARISON:  Noncontrast head CT from earlier today FINDINGS: CTA NECK FINDINGS Aortic arch: Atherosclerotic plaque.  Three vessel branching. Right carotid system: Tortuous vessels. Overall mild atherosclerosis. No flow limiting stenosis or ulceration. Left carotid system: Mild mainly calcified plaque at the  bifurcation. ICA tortuosity at the skull base. No flow limiting stenosis or ulceration. Vertebral arteries: No proximal subclavian stenosis. Limited assessment of vertebral origins due to image  blurring. Bilateral vertebral tortuosity without flow limiting stenosis or beading. Skeleton: Advanced degenerative disc narrowing and ridging with C2-3 anterolisthesis. Other neck: No acute finding. Upper chest: Negative Review of the MIP images confirms the above findings CTA HEAD FINDINGS Anterior circulation: Atheromatous calcification along the carotid siphons. Mild scattered atheromatous irregularity of bilateral MCA branches. No branch occlusion, beading, or aneurysm. Posterior circulation: Bilateral V4 segment atheromatous plaque. The vertebrobasilar arteries are diffusely patent. Moderate right distal P2 segment narrowing. Left PCA branch narrowings. Venous sinuses: Unremarkable in the arterial phase Anatomic variants: None significant Review of the MIP images confirms the above findings IMPRESSION: 1. Negative for large vessel occlusion or other emergent finding. 2. Cervical atherosclerosis without flow limiting stenosis of major neck vessels. 3. Intracranial atherosclerosis, most notably a moderate right P2 segment stenosis. Electronically Signed   By: Monte Fantasia M.D.   On: 03/24/2020 12:38   CT HEAD CODE STROKE WO CONTRAST  Result Date: 03/24/2020 CLINICAL DATA:  Code stroke. Right-sided gaze and left-sided weakness EXAM: CT HEAD WITHOUT CONTRAST TECHNIQUE: Contiguous axial images were obtained from the base of the skull through the vertex without intravenous contrast. COMPARISON:  11/22/2019 FINDINGS: Brain: No evidence of acute infarction, hemorrhage, hydrocephalus, extra-axial collection or mass lesion/mass effect. Chronic small vessel ischemia in the cerebral white matter to a moderate degree. Moderate brain atrophy Vascular: Atherosclerotic calcification Skull: Normal. Negative for fracture or focal  lesion. Sinuses/Orbits: No acute finding. Other: These results were communicated to Dr. Leonel Ramsay at 11:51 amon 6/12/2021by text page via the [facility] ([facility]) messaging system. ASPECTS [facility] Stroke Program Early CT Score) - Ganglionic level infarction (caudate, lentiform nuclei, internal capsule, insula, M1-M3 cortex): 7 - Supraganglionic infarction (M4-M6 cortex): 3 Total score (0-10 with 10 being normal): 10 IMPRESSION: Senescent changes without acute finding. Electronically Signed   By: Monte Fantasia M.D.   On: 03/24/2020 11:53    Procedures .Critical Care Performed by: Dorie Rank, MD Authorized by: Dorie Rank, MD   Critical care provider statement:    Critical care time (minutes):  45   Critical care was time spent personally by me on the following activities:  Discussions with consultants, evaluation of patient's response to treatment, examination of patient, ordering and performing treatments and interventions, ordering and review of laboratory studies, ordering and review of radiographic studies, pulse oximetry, re-evaluation of patient's condition, obtaining history from patient or surrogate and review of old charts   (including critical care time)  Medications Ordered in ED Medications  sodium chloride flush (NS) 0.9 % injection 3 mL (3 mLs Intravenous Not Given 03/24/20 1243)  sodium chloride 0.9 % bolus 1,000 mL (0 mLs Intravenous Stopped 03/24/20 1313)    Followed by  0.9 %  sodium chloride infusion (0 mLs Intravenous Stopped 03/24/20 1331)  insulin regular, human (MYXREDLIN) 100 units/ 100 mL infusion (8 Units/hr Intravenous New Bag/Given 03/24/20 1351)  0.9 %  sodium chloride infusion ( Intravenous New Bag/Given 03/24/20 1346)  dextrose 5 %-0.45 % sodium chloride infusion (has no administration in time range)  dextrose 50 % solution 0-50 mL (has no administration in time range)  potassium chloride 10 mEq in 100 mL IVPB (10 mEq Intravenous New Bag/Given 03/24/20 1342)  iohexol (OMNIPAQUE)  350 MG/ML injection 75 mL (60 mLs Intravenous Contrast Given 03/24/20 1223)    ED Course  I have reviewed the triage vital signs and the nursing notes.  Pertinent labs & imaging results that were available during my care of the patient were reviewed by me and  considered in my medical decision making (see chart for details).  Clinical Course as of Mar 24 1408  Sat Mar 24, 2020  1220 I-STAT shows elevated blood sugar.  BUN and creatinine are also elevated compared to recent.  Sodium decreased compared to previous   [JK]  1243 Pt has been evaluated by neurology team.  Not a tpa candidate.  Not a neurovascular candidate.  Will need medical admission   [JK]    Clinical Course User Index [JK] Dorie Rank, MD   MDM Rules/Calculators/A&P                          Patient presented as an acute stroke.  Symptoms are concerning for large vessel occlusion.  Imaging studies did not show any signs of vascular occlusion.  CT scan without acute findings.  Patient's laboratory tests are notable for significant hyperglycemia.  Symptoms were concerning for the possibility of hyperosmolar syndrome versus DKA as he does have an anion gap acidosis.  Patient was started on IV fluids.  IV insulin infusion was ordered.  Patient was seen by the neurology team on arrival.  Plan on admission to the hospital for further treatment. Final Clinical Impression(s) / ED Diagnoses Final diagnoses:  Diabetic ketoacidosis without coma associated with other specified diabetes mellitus ([place])  Weakness     Dorie Rank, MD 03/24/20 1409

## 2020-03-24 NOTE — ED Notes (Signed)
Sister [name], would like an update [phone]

## 2020-03-24 NOTE — H&P (Addendum)
[facility] Admission History and Physical Service Pager: [phone]  Patient name: [name] [name] Medical record number: [id number] Date of birth: 05-04-1946 Age: 74 y.o. Gender: male  Primary Care Provider: Libby Maw, MD Consultants: Neurology Code Status: Full code  Chief Complaint: Possible stroke  Assessment and Plan: [name] [name] is a 74 y.o. male presenting with strokelike symptoms including slurred speech.  Was also found to be hyperglycemic.  PMH is significant for T2DM, hypertension, CKD 4, history of DVT, history of CVA, EtOH use, cognitive decline, poor social situation.  HHS Patient presented with a glucose of 876.  VBG showed pH of 7.037, PCO2 22.6, PO2 122, bicarb 6.1.  Osmolality 339.  Sodium 131, potassium 4.2, ionized calcium 1.07.  Sodium corrects to 141.  Urine ketones at 20.  Urine glucose is greater than 500.  Rare bacteria noted on UA.  Patient is oriented to person and place but not time.  He was found by his sister and was slurring his speech and had a rightward gaze.  Per ER report patient admitted to medication noncompliance.  He reports that his sister helps him with his meds.  Patient has had prior admissions for this issue.  HHS most likely related to medication noncompliance.  This could be in correlation with CVA or could have been prior to CVA given patient has history of medication compliance issues.  Patient has been afebrile but does have elevated WBC at 12.3.  Most likely related to hemoconcentration but will continue to monitor for signs and symptoms of infection.  [name] [name] was initially worked up for CVA although further assessment revealed clear HHS.  The HHS may be the main cause for his altered mental status and focal deficits.  We will continue treating the HHS at this time as we monitor his mental status. -Admit to inpatient teaching service with Dr. Erin Hearing as attending -Vitals per routine -Hemoglobin  A1c -Continuous cardiac monitoring -Insulin drip -NS at 1.5 x maintenance rate if CBGs>250 -D5 half NS maintenance if CBG <250 -BMP every 4 hours while on insulin drip -Transition to subcu insulin once anion gap is closed x2 checks -Monitor K and if drops below 4.5 on insulin advocated fluids -Strict I's and O's  Concern for CVA Patient was last seen normal last night (6/11).  This morning when his sister called to speak with him she reported he had slurred speech.  When she arrived at his house she noticed he had slurred speech and a rightward gaze and was on the floor.  She called 911 who also noted rightward gaze.  Code stroke was called and patient was brought to the emergency department where stroke protocol CT was completed showing no acute findings.  Patient was seen by neurology who recommended stroke work-up.  Per patient he was at home this morning and he slipped and fell out of his chair.  He is unsure why.  Patient is a very poor historian.  Physical exam showed patient has rightward gaze but is able to look left.  And does not consistently respond appropriately to questions.  Patient is oriented to person and place but not time.  Patient has no focal neurologic deficits on exam.  This disorientation as well as rightward gaze could be due to altered mental status secondary to HHS or could also be related to CVA.  Overall, low suspicion for seizure.  Hopefully, his focal deficits will show a significant improvement as we treat his HHS. -Neurology consulted, appreciate  recommendations -Follow-up on MRI brain -Echocardiogram ordered -Prophylactic therapy with antiplatelet medication -Permissive hypertension with limit being 220/110 -Hydralazine as needed for blood pressure over 220/110 -Lipid panel -High intensity statin -PT/OT eval and treat -Continuous cardiac monitoring -Frequent neurochecks -N.p.o. until swallow study or speech eval  Acute renal failure in setting of CKD stage  IIIb. Patient's creatinine on admission was 5.39.  His baseline is between 2.3 and 2.6 with a GFR between 26-40.  Most likely secondary to his hyperglycemia.  We are currently prioritizing resuscitation with IVF.  We are currently working to have a central line placed for continued volume resuscitation. -Aggressive hydration with IV fluids -Morning BMPs -Avoid nephrotoxic agents -Consider nephrology consult  Hx of stroke Patient had right thalamic lacunar infarct in June 2016.  Home medications include Lipitor 10 mg daily. -Holding home medications at this time given patient is n.p.o.  HTN Patient's blood pressures since arrival have ranged from 124/101-161/82.  Home medications include amlodipine 10 mg daily, hydralazine 50 mg 3 times daily, Lasix 20 mg daily. -Holding home medications given permissive hypertension for possible CVA  Depression/anxiety Patient is on Prozac 40 mg daily. -Holding home medications given n.p.o. status -We will restart when available  History of alcohol use disorder Unclear from our admission exam and interview if he has been drinking recently or is currently intoxicated. -Follow-up serum EtOH (add on) -Consider CIWA scoring  FEN/GI: N.p.o. while on insulin drip Prophylaxis: Heparin GTT  Disposition: Admit to progressive  History of Present Illness:  [name] [name] is a 74 y.o. male presenting with possible CVA being code stroke as well as HHS.  Per chart review patient was last seen within normal limits last night (6/11).  Patient sister reportedly called him this morning and noticed that he was slurring his words.  She went to see him and noticed he was still slurring his words and he was looking off to the right.  She called EMS who brought him to the hospital.  Patient is a very poor historian.  Patient is alert and oriented to person and place but not time.  He thinks it is 2017 when asked what year it is.  He reports that he was at home this morning  and fell out of his chair and hit his head.  He is unsure why he fell but he said that his head hurts right on his forehead.  He reports that he has been taking his diabetes medications as prescribed although per chart review he acknowledged to the ED provider that he had not been compliant with his medication.  Patient reports that his sister is the one who helps with him with his medications to ensure he is taking them correctly.  Review Of Systems: Per HPI with the following additions:   Though [name] [name] would respond to simple questions during his presentation, is difficult to thoroughly assess his history.  A comprehensive review of systems was not done due to his mental status.  Patient Active Problem List   Diagnosis Date Noted  . Hyperosmolar hyperglycemic state (HHS) ([place]) 03/24/2020  . PVD (peripheral vascular disease) ([place]) 02/02/2020  . Diabetes mellitus type I ([place]) 01/03/2020  . Type 1 diabetes mellitus with hyperglycemia ([place]) 01/03/2020  . Type 1 diabetes mellitus with stage 4 chronic kidney disease ([place]) 01/03/2020  . History of hypoglycemia 12/08/2019  . Poor social situation 12/08/2019  . CKD (chronic kidney disease) stage 4, GFR 15-29 ml/min ([name]) 12/08/2019  . Hypoglycemia 11/28/2019  .  CVA (cerebral vascular accident) ([place]) 11/22/2019  . Cognitive decline 11/22/2019  . Metabolic encephalopathy 95/06/3266  . ETOH abuse 05/17/2019  . Chronic diastolic CHF (congestive heart failure) ([place]) 05/17/2019  . DKA (diabetic ketoacidoses) ([place]) 05/17/2019  . Fall 10/03/2018  . Uncontrolled type 2 diabetes mellitus with hyperglycemia, with long-term current use of insulin ([place]) 10/03/2018  . Chest wall contusion 10/03/2018  . Underweight 11/18/2017  . Anemia 11/18/2017  . DKA, type 2 ([place]) 08/01/2017  . History of cerebrovascular accident (CVA) in adulthood 07/25/2017  . HTN (hypertension) 07/25/2017  . Depression with anxiety 07/25/2017  . Left ventricular diastolic  dysfunction 12/45/8099  . Dementia ([place]) 07/25/2017  . Brittle diabetes mellitus ([place]) 06/28/2017  . Hypoglycemia due to insulin 06/27/2017  . Diabetes mellitus, insulin dependent (IDDM), controlled 06/27/2017  . Hypertension 06/27/2017  . Hyperkalemia 01/16/2017  . Acute metabolic encephalopathy 83/38/2505  . Weakness 11/16/2016  . Acute kidney injury superimposed on chronic kidney disease ([place])   . Noncompliance with medication regimen 08/17/2016  . History of DVT (deep vein thrombosis) 07/06/2016  . Vitamin B 12 deficiency 06/18/2016  . Hyperglycemia 05/15/2016  . Diabetic hyperosmolar non-ketotic state ([place]) 05/15/2016  . Hyponatremia 05/15/2016  . Anxiety 05/15/2016  . Narcotic dependency, continuous ([place]) 11/02/2015  . Uncontrolled type 2 diabetes mellitus with diabetic nephropathy, with long-term current use of insulin ([place]) 08/12/2015  . Depression 08/12/2015  . CKD (chronic kidney disease) stage 3, GFR 30-59 ml/min ([name]) 03/20/2014    Past Medical History: Past Medical History:  Diagnosis Date  . Cerebral infarction due to thrombosis of right posterior cerebral artery ([place]) 06/08/2015  . Closed comminuted intertrochanteric fracture of left femur ([place])   . Diabetes mellitus without complication ([place])   . Diabetic hyperosmolar non-ketotic state ([place]) 05/15/2016  . DKA (diabetic ketoacidoses) ([place]) 05/09/2016  . Hypertension   . Hypoglycemia 11/29/2019  . Postoperative anemia due to acute blood loss 06/18/2016  . Retroperitoneal hematoma 06/18/2016  . Stroke ([place])   . Vitamin B 12 deficiency 06/18/2016    Past Surgical History: Past Surgical History:  Procedure Laterality Date  . HIP ARTHROPLASTY Right 02/05/2013   Procedure: ARTHROPLASTY BIPOLAR HIP;  Surgeon: [name], MD;  Location: [facility];  Service: Orthopedics;  Laterality: Right;  . INTRAMEDULLARY (IM) NAIL INTERTROCHANTERIC Left 06/16/2016   Procedure: INTRAMEDULLARY (IM) NAIL INTERTROCHANTRIC;  Surgeon: Rod Can, MD;  Location: [facility];  Service: Orthopedics;  Laterality: Left;    Social History: Social History   Tobacco Use  . Smoking status: Former Smoker    Packs/day: 0.25    Years: 30.00    Pack years: 7.50    Types: Cigarettes    Quit date: 02/01/2016    Years since quitting: 4.1  . Smokeless tobacco: Never Used  Vaping Use  . Vaping Use: Never used  Substance Use Topics  . Alcohol use: No  . Drug use: No   Additional social history: Please also refer to relevant sections of EMR.  Family History: Family History  Problem Relation Age of Onset  . Alzheimer's disease Mother   . Hypertension Mother   . Hyperlipidemia Father   . Hypertension Father   . Healthy Maternal Grandmother   . Pneumonia Maternal Grandfather    Allergies and Medications: No Known Allergies No current facility-administered medications on file prior to encounter.   Current Outpatient Medications on File Prior to Encounter  Medication Sig Dispense Refill  . amLODipine (NORVASC) 10 MG tablet TAKE 1 TABLET ONCE DAILY. (  Patient taking differently: Take 10 mg by mouth daily. ) 30 tablet 0  . atorvastatin (LIPITOR) 10 MG tablet Take 1 tablet (10 mg total) by mouth daily. 30 tablet 3  . cholecalciferol (VITAMIN D3) 25 MCG (1000 UNIT) tablet Take 1,000 Units by mouth daily.    . hydrALAZINE (APRESOLINE) 50 MG tablet Take 1 tablet (50 mg total) by mouth every 8 (eight) hours. 90 tablet 1  . insulin glargine (LANTUS SOLOSTAR) 100 UNIT/ML Solostar Pen Inject 12 Units into the skin daily. 15 mL 5  . insulin lispro (HUMALOG KWIKPEN) 100 UNIT/ML KwikPen Max daily 24 units Per correctional scale (Patient taking differently: Inject 1-7 Units into the skin 3 (three) times daily. Max daily 24 units Per correctional scale) 15 mL 11  . vitamin B-12 1000 MCG tablet Take 1 tablet (1,000 mcg total) by mouth daily. 60 tablet 1  . ACCU-CHEK GUIDE test strip USE AS DIRECTED 50 strip 0  . Blood Glucose Monitoring Suppl  (ACCU-CHEK GUIDE) w/Device KIT 1 Device by Does not apply route QID. (Patient not taking: Reported on 01/03/2020) 1 kit 0  . FLUoxetine (PROZAC) 40 MG capsule Take 1 capsule (40 mg total) by mouth daily. (Patient not taking: Reported on 03/24/2020) 30 capsule 6  . furosemide (LASIX) 20 MG tablet Take 1 tablet (20 mg total) by mouth daily. 30 tablet 1  . glucose blood (ACCU-CHEK GUIDE) test strip Use as instructed 100 each 12  . glucose blood (TRUE METRIX BLOOD GLUCOSE TEST) test strip CHECK BLOOD SUGAR UP TO 4 TIMES A DAY. (Patient taking differently: CHECK BLOOD SUGAR UP TO 2 TIMES A DAY.) 300 each 0  . Insulin Pen Needle 32G X 4 MM MISC 1 Device by Does not apply route in the morning, at noon, in the evening, and at bedtime. 150 each 11  . Insulin Syringe-Needle U-100 (BD INSULIN SYRINGE U/F) 31G X 5/16" 0.3 ML MISC Inject 1 each as directed 2 (two) times daily. as directed 10 each 0  . Vitamin D, Ergocalciferol, (DRISDOL) 1.25 MG (50000 UNIT) CAPS capsule Take 1 capsule (50,000 Units total) by mouth once a week. (Patient not taking: Reported on 03/24/2020) 5 capsule 6    Objective: BP (!) 159/65   Pulse 99   Temp (!) 96.6 F (35.9 C) (Tympanic)   Resp (!) 25   SpO2 100%  Physical Exam Vitals reviewed.  Constitutional:      General: He is not in acute distress. HENT:     Head: Normocephalic and atraumatic.     Comments: Patient reports pain on his forehead where he "hit his head".  No abrasions or hematomas noticed.    Nose: Nose normal. No congestion.     Mouth/Throat:     Mouth: Mucous membranes are moist.  Eyes:     General: No scleral icterus.    Extraocular Movements: Extraocular movements intact.     Pupils: Pupils are equal, round, and reactive to light.     Comments: Patient has rightward deviation of eyes but is able to look left.  Extraocular eye movement is sluggish but intact.  Cardiovascular:     Rate and Rhythm: Tachycardia present.     Pulses: Normal pulses.   Pulmonary:     Effort: Pulmonary effort is normal.     Breath sounds: Normal breath sounds.  Abdominal:     General: Bowel sounds are normal. There is no distension.     Palpations: Abdomen is soft.  Musculoskeletal:  General: No swelling or tenderness. Normal range of motion.     Cervical back: Normal range of motion and neck supple.  Skin:    General: Skin is warm and dry.  Neurological:     Mental Status: He is alert.     Comments: Patient has rightward gaze but extraocular movement is intact and is able to look left.  No neglect noted.  Patient has 4/5 strength in upper and lower extremities bilaterally.  Sensation is intact.  He is alert and oriented to person place not time but does know who the president is.    Labs and Imaging: CBC BMET  Recent Labs  Lab 03/24/20 1221 03/24/20 1221 03/24/20 1419  WBC 12.3*  --   --   HGB 10.5*   < > 12.9*  HCT 34.9*   < > 38.0*  PLT 299  --   --    < > = values in this interval not displayed.   Recent Labs  Lab 03/24/20 1332 03/24/20 1332 03/24/20 1419  NA 130*   < > 131*  K 4.2   < > 4.2  CL 101  --   --   CO2 <7*  --   --   BUN 53*  --   --   CREATININE 5.39*  --   --   GLUCOSE 809*  --   --   CALCIUM 7.8*  --   --    < > = values in this interval not displayed.     Urinalysis    Component Value Date/Time   COLORURINE YELLOW 03/24/2020 1542   APPEARANCEUR CLEAR 03/24/2020 1542   LABSPEC 1.017 03/24/2020 1542   PHURINE 5.0 03/24/2020 1542   GLUCOSEU >=500 (A) 03/24/2020 1542   GLUCOSEU >=1000 (A) 12/08/2019 1139   HGBUR SMALL (A) 03/24/2020 1542   BILIRUBINUR NEGATIVE 03/24/2020 1542   BILIRUBINUR Negative 11/09/2019 0910   KETONESUR 20 (A) 03/24/2020 1542   PROTEINUR >=300 (A) 03/24/2020 1542   UROBILINOGEN 0.2 12/08/2019 1139   NITRITE NEGATIVE 03/24/2020 1542   LEUKOCYTESUR NEGATIVE 03/24/2020 1542   Results for ASHYR, HEDGEPATH (MRN [id number]) as of 03/24/2020 17:03  Ref. Range 03/24/2020 14:19  Sample  type Unknown VENOUS  pH, Ven Latest Ref Range: 7.25 - 7.43  7.037 (LL)  pCO2, Ven Latest Ref Range: 44 - 60 mmHg 22.6 (L)  pO2, Ven Latest Ref Range: 32 - 45 mmHg 122.0 (H)  TCO2 Latest Ref Range: 22 - 32 mmol/L 7 (L)  Acid-base deficit Latest Ref Range: 0.0 - 2.0 mmol/L 23.0 (H)  Bicarbonate Latest Ref Range: 20.0 - 28.0 mmol/L 6.1 (L)  O2 Saturation Latest Units: % 97.0    EKG: Sinus tachycardia  CT Code Stroke CTA Head W/WO contrast  Result Date: 03/24/2020 CLINICAL DATA:  Left-sided weakness EXAM: CT ANGIOGRAPHY HEAD AND NECK TECHNIQUE: Multidetector CT imaging of the head and neck was performed using the standard protocol during bolus administration of intravenous contrast. Multiplanar CT image reconstructions and MIPs were obtained to evaluate the vascular anatomy. Carotid stenosis measurements (when applicable) are obtained utilizing NASCET criteria, using the distal internal carotid diameter as the denominator. CONTRAST:  Dose is not yet known COMPARISON:  Noncontrast head CT from earlier today FINDINGS: CTA NECK FINDINGS Aortic arch: Atherosclerotic plaque.  Three vessel branching. Right carotid system: Tortuous vessels. Overall mild atherosclerosis. No flow limiting stenosis or ulceration. Left carotid system: Mild mainly calcified plaque at the bifurcation. ICA tortuosity at the skull base. No flow limiting  stenosis or ulceration. Vertebral arteries: No proximal subclavian stenosis. Limited assessment of vertebral origins due to image blurring. Bilateral vertebral tortuosity without flow limiting stenosis or beading. Skeleton: Advanced degenerative disc narrowing and ridging with C2-3 anterolisthesis. Other neck: No acute finding. Upper chest: Negative Review of the MIP images confirms the above findings CTA HEAD FINDINGS Anterior circulation: Atheromatous calcification along the carotid siphons. Mild scattered atheromatous irregularity of bilateral MCA branches. No branch occlusion,  beading, or aneurysm. Posterior circulation: Bilateral V4 segment atheromatous plaque. The vertebrobasilar arteries are diffusely patent. Moderate right distal P2 segment narrowing. Left PCA branch narrowings. Venous sinuses: Unremarkable in the arterial phase Anatomic variants: None significant Review of the MIP images confirms the above findings IMPRESSION: 1. Negative for large vessel occlusion or other emergent finding. 2. Cervical atherosclerosis without flow limiting stenosis of major neck vessels. 3. Intracranial atherosclerosis, most notably a moderate right P2 segment stenosis. Electronically Signed   By: Monte Fantasia M.D.   On: 03/24/2020 12:38   CT Code Stroke CTA Neck W/WO contrast  Result Date: 03/24/2020 CLINICAL DATA:  Left-sided weakness EXAM: CT ANGIOGRAPHY HEAD AND NECK TECHNIQUE: Multidetector CT imaging of the head and neck was performed using the standard protocol during bolus administration of intravenous contrast. Multiplanar CT image reconstructions and MIPs were obtained to evaluate the vascular anatomy. Carotid stenosis measurements (when applicable) are obtained utilizing NASCET criteria, using the distal internal carotid diameter as the denominator. CONTRAST:  Dose is not yet known COMPARISON:  Noncontrast head CT from earlier today FINDINGS: CTA NECK FINDINGS Aortic arch: Atherosclerotic plaque.  Three vessel branching. Right carotid system: Tortuous vessels. Overall mild atherosclerosis. No flow limiting stenosis or ulceration. Left carotid system: Mild mainly calcified plaque at the bifurcation. ICA tortuosity at the skull base. No flow limiting stenosis or ulceration. Vertebral arteries: No proximal subclavian stenosis. Limited assessment of vertebral origins due to image blurring. Bilateral vertebral tortuosity without flow limiting stenosis or beading. Skeleton: Advanced degenerative disc narrowing and ridging with C2-3 anterolisthesis. Other neck: No acute finding. Upper  chest: Negative Review of the MIP images confirms the above findings CTA HEAD FINDINGS Anterior circulation: Atheromatous calcification along the carotid siphons. Mild scattered atheromatous irregularity of bilateral MCA branches. No branch occlusion, beading, or aneurysm. Posterior circulation: Bilateral V4 segment atheromatous plaque. The vertebrobasilar arteries are diffusely patent. Moderate right distal P2 segment narrowing. Left PCA branch narrowings. Venous sinuses: Unremarkable in the arterial phase Anatomic variants: None significant Review of the MIP images confirms the above findings IMPRESSION: 1. Negative for large vessel occlusion or other emergent finding. 2. Cervical atherosclerosis without flow limiting stenosis of major neck vessels. 3. Intracranial atherosclerosis, most notably a moderate right P2 segment stenosis. Electronically Signed   By: Monte Fantasia M.D.   On: 03/24/2020 12:38   CT HEAD CODE STROKE WO CONTRAST  Result Date: 03/24/2020 CLINICAL DATA:  Code stroke. Right-sided gaze and left-sided weakness EXAM: CT HEAD WITHOUT CONTRAST TECHNIQUE: Contiguous axial images were obtained from the base of the skull through the vertex without intravenous contrast. COMPARISON:  11/22/2019 FINDINGS: Brain: No evidence of acute infarction, hemorrhage, hydrocephalus, extra-axial collection or mass lesion/mass effect. Chronic small vessel ischemia in the cerebral white matter to a moderate degree. Moderate brain atrophy Vascular: Atherosclerotic calcification Skull: Normal. Negative for fracture or focal lesion. Sinuses/Orbits: No acute finding. Other: These results were communicated to Dr. Leonel Ramsay at 11:51 amon 6/12/2021by text page via the [facility] messaging system. ASPECTS [facility] Stroke Program Early CT Score) - Ganglionic level  infarction (caudate, lentiform nuclei, internal capsule, insula, M1-M3 cortex): 7 - Supraganglionic infarction (M4-M6 cortex): 3 Total score (0-10 with 10 being  normal): 10 IMPRESSION: Senescent changes without acute finding. Electronically Signed   By: Monte Fantasia M.D.   On: 03/24/2020 11:53     Gifford Shave, MD 03/24/2020, 2:56 PM PGY-1, [place] Intern pager: [phone], text pages welcome  FPTS Upper-Level Resident Addendum   I have independently interviewed and examined the patient. I have discussed the above with the original author and agree with their documentation. My edits for correction/addition/clarification are in blue. Please see also any attending notes.    Matilde Haymaker MD PGY-2, [facility] 03/24/2020 9:19 PM  FPTS Service pager: [phone] (text pages welcome through [place])

## 2020-03-24 NOTE — ED Provider Notes (Signed)
Angiocath insertion Performed by: Aubre Quincy  Consent: Verbal consent obtained. Risks and benefits: risks, benefits and alternatives were discussed Time out: Immediately prior to procedure a "time out" was called to verify the correct patient, procedure, equipment, support staff and site/side marked as required.  Preparation: Patient was prepped and draped in the usual sterile fashion.  Vein Location: Left AC  Ultrasound Guided: Yes  Gauge: 18 g  Normal blood return and flush without difficulty Patient tolerance: Patient tolerated the procedure well with no immediate complications.   Patient had no IV access and required CT perfusion study.  CT perfusion study requires 18-gauge.  We were able to successfully place an ultrasound-guided IV over the left [facility] without any complications.   Varney Biles, MD 03/24/20 1235

## 2020-03-24 NOTE — Procedures (Addendum)
     Central Venous Catheter Insertion Procedure Note [name] [name] [id number] 14-May-1946  Procedure: Insertion of Central Venous Catheter Indications: Drug and/or fluid administration  Procedure Details Consent: Consent obtained via phone from patient's sister [name] [name] Time Out: Verified patient identification, verified procedure, site/side was marked, verified correct patient position, special equipment/implants available, medications/allergies/relevent history reviewed, required imaging and test results available.  Performed  Maximum sterile technique was used including antiseptics, cap, gloves, gown, hand hygiene, mask and sheet. Skin prep: Chlorhexidine; local anesthetic administered A antimicrobial bonded/coated triple lumen catheter was placed in the left internal jugular vein using the Seldinger technique and verified with [place].  Evaluation Blood flow good Complications: No apparent complications Patient did tolerate procedure well. Chest X-ray ordered to verify placement.  CXR: pending   Otilio Carpen Britt Petroni, PA-C 9:26 PM

## 2020-03-24 NOTE — Progress Notes (Signed)
Spoke with CCM regarding central line placement. The NP agreed that she would come evaluate the patient and place central line if necessary. He must remain in the emergency room until he received central line.

## 2020-03-24 NOTE — Progress Notes (Signed)
VAST consult to place midline. Notified pt's nurse that he is not a candidate for midline or PICC placement as he is a renal pt with a creatinine <30.  Education provided regarding vein preservation in this patient population as well as need for nephrology approval of midline or PICC placement in this patient population. ER RN verbalized understanding and to notify physician.

## 2020-03-24 NOTE — Progress Notes (Signed)
FPTS Interim Progress Note  S: PM check on patient.  Patient was awake and alert and answering all questions appropriately.  Is oriented to person which is consistent with admission exam.  Was able to tell me that he is doing better.  CCM team at bedside setting up to place central line.  Per CCM patient was able to answer all questions appropriately when they were discussing central line.  They have called patient's sister to update her on patient status.  Hopefully with increased fluids patient will continue to improve.  O: BP (!) 120/93   Pulse 91   Temp (!) 96.6 F (35.9 C) (Tympanic)   Resp (!) 23   SpO2 98%   Gen: awake and alert, NAD Neuro: oriented x1, answering questions appropriately   A/P: Concern for CVA -neurology consulted, appreciate recommendations -currently not stable for MRI, will obtain pending clinical improvement  HHS Unable to obtain further fluids until central line is placed. CCM at bedside to place central line for access. AMS likely 2/2 to HHS -continue fluid boluses as previously ordered  -NS @ 200cc/h, will switch to D5 when CBG <250 -BMP q4h  Caroline More, DO 03/24/2020, 8:13 PM PGY-3, [place] [facility] Service pager [phone]

## 2020-03-24 NOTE — Consult Note (Addendum)
NEURO HOSPITALIST  CONSULT   Requesting Physician: Dr. Tomi Bamberger    Chief Complaint: rt gaze/ left side weakness  History obtained from:  EMS HPI:                                                                                                                                         [name] [name] is an 74 y.o. male  With PMH, CVA (05/2015), HTN, DM2 who presented to [facility] ED as a code stroke with c/o right gaze left side weakness.   Per report patient's LKW was 1946 03/23/20 when his sister left his house. This morning she called him to ask what he wanted for breakfast and she stated that he sounded slurred over the phone. When she got there she found the patient with slurred speech, right gaze and on the floor. Per patient when he woke up this morning he felt weird.   ED course:  CTH: no hemorrhage CBG> 400 (has been over 400 all week) CBG> 700, Na: 125 creatinine 5.50 BUN: 68 BP 132/86 CTA: no LVO tPA Given: no; outside of window Modified Rankin: Rankin Score=3 NIHSS:8     Past Medical History:  Diagnosis Date  . 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 Closed comminuted intertrochanteric fracture of left femur ([place])   . Diabetes mellitus without complication ([place])   . Diabetic hyperosmolar non-ketotic state ([place]) 05/15/2016  . DKA (diabetic ketoacidoses) ([place]) 05/09/2016  . Hypertension   . Hypoglycemia 11/29/2019  . Postoperative anemia due to acute blood loss 06/18/2016  . Retroperitoneal hematoma 06/18/2016  . Stroke ([place])   . Vitamin B 12 deficiency 06/18/2016    Past Surgical History:  Procedure Laterality Date  . HIP ARTHROPLASTY Right 02/05/2013   Procedure: ARTHROPLASTY BIPOLAR HIP;  Surgeon: [name], MD;  Location: [facility];  Service: Orthopedics;  Laterality: Right;  . INTRAMEDULLARY (IM) NAIL INTERTROCHANTERIC Left 06/16/2016   Procedure: INTRAMEDULLARY (IM) NAIL  INTERTROCHANTRIC;  Surgeon: Rod Can, MD;  Location: [place];  Service: Orthopedics;  Laterality: Left;    Family History  Problem Relation Age of Onset  . Alzheimer's disease Mother   . Hypertension Mother   . Hyperlipidemia Father   . Hypertension Father   . Healthy Maternal Grandmother   . Pneumonia Maternal Grandfather       Social History:  reports that he quit smoking about 4 years ago. His smoking use included cigarettes. He has a 7.50 pack-year smoking history. He has never used smokeless tobacco. He reports that he does not drink alcohol and does not use drugs.  Allergies:  No Known Allergies  Medications:                                                                                                                          Current Facility-Administered Medications  Medication Dose Route Frequency Provider Last Rate Last Admin  . iohexol (OMNIPAQUE) 350 MG/ML injection 75 mL  75 mL Intravenous Once PRN Greta Doom, MD      . sodium chloride flush (NS) 0.9 % injection 3 mL  3 mL Intravenous Once Dorie Rank, MD       Current Outpatient Medications  Medication Sig Dispense Refill  . ACCU-CHEK GUIDE test strip USE AS DIRECTED 50 strip 0  . amLODipine (NORVASC) 10 MG tablet TAKE 1 TABLET ONCE DAILY. (Patient taking differently: Take 10 mg by mouth daily. ) 30 tablet 0  . atorvastatin (LIPITOR) 10 MG tablet Take 1 tablet (10 mg total) by mouth daily. 30 tablet 3  . Blood Glucose Monitoring Suppl (ACCU-CHEK GUIDE) w/Device KIT 1 Device by Does not apply route QID. (Patient not taking: Reported on 01/03/2020) 1 kit 0  . FLUoxetine (PROZAC) 40 MG capsule Take 1 capsule (40 mg total) by mouth daily. 30 capsule 6  . furosemide (LASIX) 20 MG tablet Take 1 tablet (20 mg total) by mouth daily. 30 tablet 1  . glucose blood (ACCU-CHEK GUIDE) test strip Use as instructed 100 each 12  . glucose blood (TRUE METRIX BLOOD GLUCOSE TEST) test strip CHECK BLOOD SUGAR UP TO 4 TIMES A  DAY. (Patient taking differently: CHECK BLOOD SUGAR UP TO 2 TIMES A DAY.) 300 each 0  . hydrALAZINE (APRESOLINE) 50 MG tablet Take 1 tablet (50 mg total) by mouth every 8 (eight) hours. 90 tablet 1  . insulin glargine (LANTUS SOLOSTAR) 100 UNIT/ML Solostar Pen Inject 12 Units into the skin daily. 15 mL 5  . insulin lispro (HUMALOG KWIKPEN) 100 UNIT/ML KwikPen Max daily 24 units Per correctional scale 15 mL 11  . Insulin Pen Needle 32G X 4 MM MISC 1 Device by Does not apply route in the morning, at noon, in the evening, and at bedtime. 150 each 11  . Insulin Syringe-Needle U-100 (BD INSULIN SYRINGE U/F) 31G X 5/16" 0.3 ML MISC Inject 1 each as directed 2 (two) times daily. as directed 10 each 0  . vitamin B-12 1000 MCG tablet Take 1 tablet (1,000 mcg total) by mouth daily. 60 tablet 1  . Vitamin D, Ergocalciferol, (DRISDOL) 1.25 MG (50000 UNIT) CAPS capsule Take 1 capsule (50,000 Units total) by mouth once a week. 5 capsule 6    ROS:  ROS was performed and is negative except as noted in HPI    General Examination:                                                                                                      There were no vitals taken for this visit.  Physical Exam  Constitutional: Appears well-developed and well-nourished.  Psych: Affect appropriate to situation Eyes: Normal external eye and conjunctiva. HENT: Normocephalic, no lesions, without obvious abnormality.   Musculoskeletal-no joint tenderness, deformity or swelling Cardiovascular: Normal rate and regular rhythm.  Respiratory: Effort normal, non-labored breathing saturations WNL GI: Soft.  No distension. There is no tenderness.  Skin: WDI  Neurological Examination Mental Status: Alert, oriented name/ age/year, missed month, thought content appropriate.  Speech fluent without evidence of  aphasia. severely dysarthric, Able to follow commands without difficulty. Cranial Nerves: II:  Visual fields grossly normal,  III,IV, VI: ptosis not present, right gaze preference, but able to cross midline extra-ocular motions intact bilaterally, pupils equal, round, reactive to light  V,VII: smile symmetric, facial light touch sensation normal bilaterally VIII: hearing normal bilaterally IX,X: uvula rises midline XI: bilateral shoulder shrug XII: midline tongue extension Motor: Able to raise BUE and right lower extremity anti-gravity without drift. LLE did not move at first, but started to move in CT. Tone and bulk:normal tone throughout; no atrophy noted Sensory:  Extinction to left with DSS Plantars: Right: downgoing   Left: downgoing Cerebellar: No ataxia noted Gait: deferred   Lab Results: Basic Metabolic Panel: Recent Labs  Lab 03/24/20 1216  NA 125*  K 5.0  CL 111  GLUCOSE >700*  BUN 68*  CREATININE 5.50*    CBC: Recent Labs  Lab 03/24/20 1216  HGB 11.2*  HCT 33.0*     CBG: Recent Labs  Lab 03/24/20 1207  GLUCAP >600*    Imaging: CT HEAD CODE STROKE WO CONTRAST  Result Date: 03/24/2020 CLINICAL DATA:  Code stroke. Right-sided gaze and left-sided weakness EXAM: CT HEAD WITHOUT CONTRAST TECHNIQUE: Contiguous axial images were obtained from the base of the skull through the vertex without intravenous contrast. COMPARISON:  11/22/2019 FINDINGS: Brain: No evidence of acute infarction, hemorrhage, hydrocephalus, extra-axial collection or mass lesion/mass effect. Chronic small vessel ischemia in the cerebral white matter to a moderate degree. Moderate brain atrophy Vascular: Atherosclerotic calcification Skull: Normal. Negative for fracture or focal lesion. Sinuses/Orbits: No acute finding. Other: These results were communicated to Dr. Leonel Ramsay at 11:51 amon 6/12/2021by text page via the [facility] messaging system. ASPECTS [facility] Stroke Program Early CT Score) -  Ganglionic level infarction (caudate, lentiform nuclei, internal capsule, insula, M1-M3 cortex): 7 - Supraganglionic infarction (M4-M6 cortex): 3 Total score (0-10 with 10 being normal): 10 IMPRESSION: Senescent changes without acute finding. Electronically Signed   By: Monte Fantasia M.D.   On: 03/24/2020 11:53       Laurey Morale, MSN, NP-C [facility] [phone]  03/24/2020, 11:47 AM   Attending physician note to follow with Assessment and plan .  I have seen the patient reviewed the above note.  Assessment: 74  y.o. male With PMH, CVA (05/2015), HTN, DM2 who presented to [facility] ED as a code stroke with c/o right gaze left side weakness. TPA was not offered d/t patient presenting outside of the window.CTA delayed d/t difficulty obtaining access. CTA: no LVO; patient not a candidate for IR.  Stroke Risk Factors - diabetes mellitus    Recommendations: -- BP goal : Permissive HTN upto 220/110 mmHg (for 24-48 post admission) goal is to normalize BP < 140/90 by discharge. --MRI Brain  --CTA (done) --Echocardiogram -- Prophylactic therapy-Antiplatelet med -- High intensity Statin if LDL > 70 -- HgbA1c, fasting lipid panel -- PT consult, OT consult, Speech consult --Telemetry monitoring --Frequent neuro checks --Stroke swallow screen  --Please page the Stroke team from 8am-4pm.   You can look them up on [email or web address]    Roland Rack, MD [facility] [phone]  If 7pm- 7am, please page neurology on call as listed in [place].

## 2020-03-24 NOTE — ED Triage Notes (Signed)
Pt here via EMS as code stroke, last seen normal 1930 last night. Pt lives alone and when his family called him to ask what he wanted for breakfast this morning, his speech was slurred. Family then went to the house and found patient on the floor. Presents with L sided weakness and neglect, slurred speech, and R gaze.

## 2020-03-24 NOTE — Progress Notes (Signed)
FPTS Interim Progress Note  PM check on patient. Patient resting comfortably. Easily awakens. Drowsy, but appropriate given it was close to midnight. Patient was upset that I woke up. Would not answer questions but did state he wished to return to sleep. Moving extremities and following commands. Last CBG 149, currently on D5 @150cc /hr  O: BP (!) 179/92 (BP Location: Right Arm)   Pulse 84   Temp 98.7 F (37.1 C) (Oral)   Resp 20   SpO2 100%   Gen: awake and alert, NAD, drowsy but awakes easily to voice Cardio: RRR, no MRG Resp: CTAB, no wheezes, rales or rhonchi GI: soft, non tender, non distended, bowel sounds present Ext: no edema Neuro: following commands, sensation intact  A/P: HHS -BMP q4hr, last BMP obtained but pending results  -continue fluids -can likely transition to SQ insulin in AM  Caroline More, DO 03/24/2020, 11:57 PM PGY-3, [place] Medicine Service pager [phone]

## 2020-03-24 NOTE — Progress Notes (Signed)
VAST consulted to obtain 2nd IV access as pt is receiving insulin drip. Assessed bilateral lower and upper arms utilizing ultrasound. Pt does not have any vessels that are appropriate for USGIV placement. ER RN notified. Recommended patient have a CL placed. Pt's sister verbalized that with previous hospitalizations, he has had lines in his neck and chest.

## 2020-03-25 ENCOUNTER — Inpatient Hospital Stay (HOSPITAL_COMMUNITY): Payer: Medicare Other

## 2020-03-25 DIAGNOSIS — E1310 Other specified diabetes mellitus with ketoacidosis without coma: Secondary | ICD-10-CM

## 2020-03-25 DIAGNOSIS — R531 Weakness: Secondary | ICD-10-CM

## 2020-03-25 LAB — BASIC METABOLIC PANEL
Anion gap: 11 (ref 5–15)
Anion gap: 11 (ref 5–15)
Anion gap: 9 (ref 5–15)
Anion gap: 8 (ref 5–15)
Anion gap: 9 (ref 5–15)
BUN: 44 mg/dL — ABNORMAL HIGH (ref 8–23)
BUN: 43 mg/dL — ABNORMAL HIGH (ref 8–23)
BUN: 42 mg/dL — ABNORMAL HIGH (ref 8–23)
BUN: 41 mg/dL — ABNORMAL HIGH (ref 8–23)
BUN: 41 mg/dL — ABNORMAL HIGH (ref 8–23)
CO2: 16 mmol/L — ABNORMAL LOW (ref 22–32)
CO2: 15 mmol/L — ABNORMAL LOW (ref 22–32)
CO2: 15 mmol/L — ABNORMAL LOW (ref 22–32)
CO2: 15 mmol/L — ABNORMAL LOW (ref 22–32)
CO2: 15 mmol/L — ABNORMAL LOW (ref 22–32)
Calcium: 8.1 mg/dL — ABNORMAL LOW (ref 8.9–10.3)
Calcium: 7.7 mg/dL — ABNORMAL LOW (ref 8.9–10.3)
Calcium: 7.6 mg/dL — ABNORMAL LOW (ref 8.9–10.3)
Calcium: 7.5 mg/dL — ABNORMAL LOW (ref 8.9–10.3)
Calcium: 7.8 mg/dL — ABNORMAL LOW (ref 8.9–10.3)
Chloride: 109 mmol/L (ref 98–111)
Chloride: 109 mmol/L (ref 98–111)
Chloride: 111 mmol/L (ref 98–111)
Chloride: 111 mmol/L (ref 98–111)
Chloride: 111 mmol/L (ref 98–111)
Creatinine, Ser: 4.5 mg/dL — ABNORMAL HIGH (ref 0.61–1.24)
Creatinine, Ser: 4.15 mg/dL — ABNORMAL HIGH (ref 0.61–1.24)
Creatinine, Ser: 4.34 mg/dL — ABNORMAL HIGH (ref 0.61–1.24)
Creatinine, Ser: 4.13 mg/dL — ABNORMAL HIGH (ref 0.61–1.24)
Creatinine, Ser: 4.01 mg/dL — ABNORMAL HIGH (ref 0.61–1.24)
GFR calc Af Amer: 14 mL/min — ABNORMAL LOW (ref >60)
GFR calc Af Amer: 15 mL/min — ABNORMAL LOW (ref >60)
GFR calc Af Amer: 14 mL/min — ABNORMAL LOW (ref >60)
GFR calc Af Amer: 15 mL/min — ABNORMAL LOW (ref >60)
GFR calc Af Amer: 16 mL/min — ABNORMAL LOW (ref >60)
GFR calc non Af Amer: 12 mL/min — ABNORMAL LOW (ref >60)
GFR calc non Af Amer: 13 mL/min — ABNORMAL LOW (ref >60)
GFR calc non Af Amer: 12 mL/min — ABNORMAL LOW (ref >60)
GFR calc non Af Amer: 13 mL/min — ABNORMAL LOW (ref >60)
GFR calc non Af Amer: 14 mL/min — ABNORMAL LOW (ref >60)
Glucose, Bld: 179 mg/dL — ABNORMAL HIGH (ref 70–99)
Glucose, Bld: 130 mg/dL — ABNORMAL HIGH (ref 70–99)
Glucose, Bld: 171 mg/dL — ABNORMAL HIGH (ref 70–99)
Glucose, Bld: 163 mg/dL — ABNORMAL HIGH (ref 70–99)
Glucose, Bld: 276 mg/dL — ABNORMAL HIGH (ref 70–99)
Potassium: 3.4 mmol/L — ABNORMAL LOW (ref 3.5–5.1)
Potassium: 3.6 mmol/L (ref 3.5–5.1)
Potassium: 3.6 mmol/L (ref 3.5–5.1)
Potassium: 3.2 mmol/L — ABNORMAL LOW (ref 3.5–5.1)
Potassium: 4.8 mmol/L (ref 3.5–5.1)
Sodium: 136 mmol/L (ref 135–145)
Sodium: 135 mmol/L (ref 135–145)
Sodium: 135 mmol/L (ref 135–145)
Sodium: 134 mmol/L — ABNORMAL LOW (ref 135–145)
Sodium: 135 mmol/L (ref 135–145)

## 2020-03-25 LAB — LIPID PANEL
Cholesterol: 185 mg/dL (ref 0–200)
HDL: 76 mg/dL (ref >40)
LDL Cholesterol: 97 mg/dL (ref 0–99)
Total CHOL/HDL Ratio: 2.4 RATIO
Triglycerides: 61 mg/dL (ref <150)
VLDL: 12 mg/dL (ref 0–40)

## 2020-03-25 LAB — GLUCOSE, CAPILLARY
Glucose-Capillary: 115 mg/dL — ABNORMAL HIGH (ref 70–99)
Glucose-Capillary: 131 mg/dL — ABNORMAL HIGH (ref 70–99)
Glucose-Capillary: 136 mg/dL — ABNORMAL HIGH (ref 70–99)
Glucose-Capillary: 142 mg/dL — ABNORMAL HIGH (ref 70–99)
Glucose-Capillary: 144 mg/dL — ABNORMAL HIGH (ref 70–99)
Glucose-Capillary: 163 mg/dL — ABNORMAL HIGH (ref 70–99)
Glucose-Capillary: 165 mg/dL — ABNORMAL HIGH (ref 70–99)
Glucose-Capillary: 168 mg/dL — ABNORMAL HIGH (ref 70–99)
Glucose-Capillary: 189 mg/dL — ABNORMAL HIGH (ref 70–99)
Glucose-Capillary: 191 mg/dL — ABNORMAL HIGH (ref 70–99)
Glucose-Capillary: 195 mg/dL — ABNORMAL HIGH (ref 70–99)
Glucose-Capillary: 198 mg/dL — ABNORMAL HIGH (ref 70–99)
Glucose-Capillary: 217 mg/dL — ABNORMAL HIGH (ref 70–99)
Glucose-Capillary: 217 mg/dL — ABNORMAL HIGH (ref 70–99)
Glucose-Capillary: 255 mg/dL — ABNORMAL HIGH (ref 70–99)
Glucose-Capillary: 259 mg/dL — ABNORMAL HIGH (ref 70–99)
Glucose-Capillary: 282 mg/dL — ABNORMAL HIGH (ref 70–99)
Glucose-Capillary: 306 mg/dL — ABNORMAL HIGH (ref 70–99)

## 2020-03-25 MED ORDER — POTASSIUM CHLORIDE CRYS ER 20 MEQ PO TBCR
40.0000 meq | EXTENDED_RELEASE_TABLET | Freq: Once | ORAL | Status: AC
  Administered 2020-03-25: 40 meq via ORAL
  Filled 2020-03-25: qty 2

## 2020-03-25 MED ORDER — FLUOXETINE HCL 40 MG PO CAPS: 40.0000 mg | ORAL_CAPSULE | Freq: Every day | ORAL | Status: DC

## 2020-03-25 MED ORDER — POTASSIUM CHLORIDE 10 MEQ/100ML IV SOLN
10.0000 meq | INTRAVENOUS | Status: AC
  Administered 2020-03-25 (×2): 10 meq via INTRAVENOUS
  Filled 2020-03-25: qty 100

## 2020-03-25 MED ORDER — ATORVASTATIN CALCIUM 10 MG PO TABS
10.0000 mg | ORAL_TABLET | Freq: Every day | ORAL | Status: DC
  Administered 2020-03-25: 10 mg via ORAL
  Filled 2020-03-25: qty 1

## 2020-03-25 MED ORDER — ASPIRIN EC 81 MG PO TBEC
81.0000 mg | DELAYED_RELEASE_TABLET | Freq: Every day | ORAL | Status: DC
  Administered 2020-03-25 – 2020-03-29 (×5): 81 mg via ORAL
  Filled 2020-03-25 (×5): qty 1

## 2020-03-25 MED ORDER — ATORVASTATIN CALCIUM 40 MG PO TABS
40.0000 mg | ORAL_TABLET | Freq: Every day | ORAL | Status: DC
  Administered 2020-03-26 – 2020-03-29 (×4): 40 mg via ORAL
  Filled 2020-03-25 (×4): qty 1

## 2020-03-25 MED ORDER — HYDRALAZINE HCL 50 MG PO TABS
50.0000 mg | ORAL_TABLET | Freq: Three times a day (TID) | ORAL | Status: DC
  Administered 2020-03-25 – 2020-03-29 (×12): 50 mg via ORAL
  Filled 2020-03-25 (×12): qty 1

## 2020-03-25 MED ORDER — SODIUM CHLORIDE 0.9 % IV SOLN: INTRAVENOUS | Status: DC

## 2020-03-25 MED ORDER — AMLODIPINE BESYLATE 10 MG PO TABS
10.0000 mg | ORAL_TABLET | Freq: Every day | ORAL | Status: DC
  Administered 2020-03-25 – 2020-03-29 (×5): 10 mg via ORAL
  Filled 2020-03-25 (×5): qty 1

## 2020-03-25 MED ORDER — INSULIN ASPART 100 UNIT/ML ~~LOC~~ SOLN
0.0000 [IU] | Freq: Three times a day (TID) | SUBCUTANEOUS | Status: DC
  Administered 2020-03-25: 7 [IU] via SUBCUTANEOUS
  Administered 2020-03-26: 1 [IU] via SUBCUTANEOUS
  Administered 2020-03-27: 2 [IU] via SUBCUTANEOUS
  Administered 2020-03-28: 3 [IU] via SUBCUTANEOUS
  Administered 2020-03-28: 1 [IU] via SUBCUTANEOUS
  Administered 2020-03-29: 2 [IU] via SUBCUTANEOUS
  Administered 2020-03-29: 3 [IU] via SUBCUTANEOUS

## 2020-03-25 MED ORDER — CHLORHEXIDINE GLUCONATE CLOTH 2 % EX PADS
6.0000 | MEDICATED_PAD | Freq: Every day | CUTANEOUS | Status: DC
  Administered 2020-03-25 – 2020-03-29 (×5): 6 via TOPICAL

## 2020-03-25 MED ORDER — INSULIN GLARGINE 100 UNIT/ML ~~LOC~~ SOLN
12.0000 [IU] | Freq: Every day | SUBCUTANEOUS | Status: DC
  Administered 2020-03-25 – 2020-03-27 (×3): 12 [IU] via SUBCUTANEOUS
  Filled 2020-03-25 (×4): qty 0.12

## 2020-03-25 NOTE — Progress Notes (Signed)
Subjective: Markedly improved  Exam: Vitals:   03/25/20 1039 03/25/20 1225  BP: (!) 168/84 (!) 179/76  Pulse: 68 65  Resp:  18  Temp:  98.1 F (36.7 C)  SpO2:  100%   Gen: In bed, NAD Resp: non-labored breathing, no acute distress Abd: soft, nt  Neuro: MS: Awake, alert, not oriented to month CN: Pupils equal round and reactive, visual fields full, extraocular movements intact, face symmetric Motor: 5/5 throughout Sensory: Intact to light touch  Pertinent Labs: Creatinine 4.1 Glucose 800s -> 163 Echo is negative for embolic source.  LDL 117  Impression: 74 year old male who presented with focal symptoms in the setting of severe hyperglycemia. TIA is a possibility, though given duration of symptoms, less likely.  I do think increasing statin therapy and using antiplatelet therapy would be prudent, and could consider MRI.  Recommendations: 1) recommend increasing statin therapy for LDL 117 2) consider antiplatelet therapy 3) Consider MRI brain if not at baseline.  4) Please call with further questions or concerns.   Roland Rack, MD [facility] [phone]  If 7pm- 7am, please page neurology on call as listed in [place].

## 2020-03-25 NOTE — Progress Notes (Addendum)
Family Medicine Teaching Service Daily Progress Note Intern Pager: [phone]  Patient name: [name] [name] Medical record number: [id number] Date of birth: 11-Feb-1946 Age: 74 y.o. Gender: male  Primary Care Provider: Libby Maw, MD Consultants: Neurology Code Status: Full code  Pt Overview and Major Events to Date:  03/24/2020 admitted for possible stroke  Assessment and Plan: [name] [name] is a 74 year old male presenting with stroke-like symptoms, including slurred speech.  Additionally, was found to be hyperglycemic to 809.  PMH significant for T2DM, hypertension, CKD 4, history of DVT, history of CVA, alcohol use disorder, cognitive decline, and poor social situation.  Altered mentation due to HHS, resolved Patient presented with altered mentation, was found to have glucose 809, was started on normal saline at one half maintenance, then converted overnight to D5 half-normal saline maintenance rate.  Patient CBGs now below 200 x10, patient's anion gap is now closed x3.  Patient was initially worked up for CVA due to his altered mentation and focal neurological deficits (see next problem).  Patient has a history of medication noncompliance, HbA1c on admission 10.3%. -Convert to subcutaneous insulin sensitive sliding scale, Lantus 12 at night, carb modified diet -Continuous cardiac monitoring -BMP every 12 hours -Strict I's and O's -Patient is now back to his baseline neurological function, will cancel MRI, low concern for stroke  Concern for CVA Patient altered on admission, had been found down at home by family member, noted to have slurred speech and rightward gaze by family member and EMS.  Work-up in emergency department negative for CVA no focal neurological deficits.  Additionally, patient is poor historian, at baseline oriented to person and place, not time.  Lipid panel this admission within normal limits.  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   Home meds include Lipitor 10 mg daily.  CTA was negative for any acute findings. -Neurology following, appreciate recommendations -Low suspicion for stroke as patient is now back to his baseline after correcting elevated blood sugar, canceling MRI, echo -PT/OT eval and treat -Statin Lipitor 10  Acute on chronic kidney disease, 4, slightly improved Creatinine on admission 5.39, slightly improved today at 4.34, GFR today improved at 14. Historically baseline 2.3-2.6, GFR 26-40. -Avoid nephrotoxic medications -Continue to monitor -Continue IV hydration  Hypokalemia Patient with hypokalemia at 3.2 after being treated for HHS with insulin drip. -Replete with K-Dur 40 mEq x 1 -Follow-up tomorrow morning  Hypertension Most recent blood pressure 179/76. Patient taking amlodipine 10 mg daily, hydralazine 50 mg 3 times daily, Lasix 20 mg daily. -Low suspicion for stroke as patient is now back to baseline after correction of hyperglycemia -We will restart amlodipine, hydralazine; continue to hold Lasix, can always restart depending on blood pressures  Depression/Anxiety Patient prescribed Prozac 40 mg daily at home, however states he is currently not taking -Hold home Prozac for now  History alcohol use disorder EtOH on admission <10. -Follow CIWA's  FEN/GI: Carb modified diet PPx: Heparin subQ  Disposition: Progressive care  Subjective:  Patient seen resting in bed this morning, is very sleepy, but is easily woken, wakes up to see me and says "Hey darling!"  Spoke with the patient's sister [name] who says that based on his physical exam findings he sounds like he is at his baseline.   Objective: Temp:  [96.6 F (35.9 C)-98.7 F (37.1 C)] 98.1 F (36.7 C) (06/13 0811) Pulse Rate:  [72-110] 72 (06/13 0811) Resp:  [16-26] 20 (06/13 0811) BP: (120-202)/(56-109) 153/86 (06/13 7169)  SpO2:  [98 %-100 %] 100 % (06/13 0811)  Physical Exam: General: No apparent distress, pleasant  patient, appears older than stated age Cardiovascular: RRR, S1-S2 present, no murmurs Respiratory: CTA bilaterally, comfortable work of breathing Abdomen: Soft, nontender, normal bowel sounds appreciated Neuro: Patient alert and oriented to person and place, does not know the year.  Cranial nerves II-XII intact, no focal neurological deficits appreciated on physical exam  Laboratory: Recent Labs  Lab 03/24/20 1216 03/24/20 1221 03/24/20 1419  WBC  --  12.3*  --   HGB 11.2* 10.5* 12.9*  HCT 33.0* 34.9* 38.0*  PLT  --  299  --    Recent Labs  Lab 03/24/20 1221 03/24/20 1221 03/24/20 1332 03/24/20 1332 03/24/20 1419 03/24/20 2259 03/25/20 0213  NA 130*   < > 130*   < > 131* 136 135  K 4.2   < > 4.2   < > 4.2 3.4* 3.6  CL 98   < > 101  --   --  109 109  CO2 <7*   < > <7*  --   --  16* 15*  BUN 54*   < > 53*  --   --  44* 43*  CREATININE 5.63*   < > 5.39*  --   --  4.50* 4.15*  CALCIUM 8.3*   < > 7.8*  --   --  8.1* 7.7*  PROT 6.1*  --   --   --   --   --   --   BILITOT 1.3*  --   --   --   --   --   --   ALKPHOS 60  --   --   --   --   --   --   ALT 14  --   --   --   --   --   --   AST 17  --   --   --   --   --   --   GLUCOSE 876*   < > 809*  --   --  179* 130*   < > = values in this interval not displayed.   Lipid Panel     Component Value Date/Time   CHOL 185 03/25/2020 0213   CHOL 220 (H) 03/16/2019 1021   TRIG 61 03/25/2020 0213   HDL 76 03/25/2020 0213   HDL 61 03/16/2019 1021   CHOLHDL 2.4 03/25/2020 0213   VLDL 12 03/25/2020 0213   LDLCALC 97 03/25/2020 0213   LDLCALC 117 (H) 03/16/2019 1021   LABVLDL 42 (H) 03/16/2019 1021   Ethanol level: Less than 10  Urinalysis    Component Value Date/Time   COLORURINE YELLOW 03/24/2020 1542   APPEARANCEUR CLEAR 03/24/2020 1542   LABSPEC 1.017 03/24/2020 1542   PHURINE 5.0 03/24/2020 1542   GLUCOSEU >=500 (A) 03/24/2020 1542   GLUCOSEU >=1000 (A) 12/08/2019 1139   HGBUR SMALL (A) 03/24/2020 1542    BILIRUBINUR NEGATIVE 03/24/2020 1542   BILIRUBINUR Negative 11/09/2019 0910   KETONESUR 20 (A) 03/24/2020 1542   PROTEINUR >=300 (A) 03/24/2020 1542   UROBILINOGEN 0.2 12/08/2019 1139   NITRITE NEGATIVE 03/24/2020 1542   LEUKOCYTESUR NEGATIVE 03/24/2020 1542    Imaging/Diagnostic Tests: CT Code Stroke CTA Head W/WO (03/24/2020) 1. Negative for large vessel occlusion or other emergent finding.  2. Cervical atherosclerosis without flow limiting stenosis of major neck vessels.  3. Intracranial atherosclerosis, most notably a moderate right P2 segment stenosis.  CT Code Stroke CTA Neck W/WO contrast (03/24/2020): 1. Negative for large vessel occlusion or other emergent finding.  2. Cervical atherosclerosis without flow limiting stenosis of major neck vessels.  3. Intracranial atherosclerosis, most notably a moderate right P2 segment stenosis.  CT head code stroke without contrast (03/24/2020): Senescent changes without acute finding.  CXR 03/24/20:  Cardiomegaly, vascular congestion. Left central line tip in the SVC.  No pneumothorax.   Daisy Floro, DO 03/25/2020, 8:17 AM PGY-2, [place] Intern pager: [phone], text pages welcome

## 2020-03-25 NOTE — Progress Notes (Addendum)
Follow up per message below from RN Adetutu Abidoye. Did not receive a page from this RN during our shift regarding [place]. I did not call or speak with this nurse or agree to putting this order in. Unsure why patient would need this imaging.     Called floor and spoke to patient's day nurse, was also unsure of reason for KBU or whom RN Nydia Bouton said she spoke to. Asked her to investigate and call back as needed.  UPDATE: RN called back, stated MRI needed the [place] for clearance for the patient to have his MRI. KUB order placed.    [name] [name], [place], PGY-2 03/25/2020 8:17 AM

## 2020-03-25 NOTE — Progress Notes (Signed)
Inpatient Diabetes Program Recommendations  AACE/ADA: New Consensus Statement on Inpatient Glycemic Control (2015)  Target Ranges:  Prepandial:   less than 140 mg/dL      Peak postprandial:   less than 180 mg/dL (1-2 hours)      Critically ill patients:  140 - 180 mg/dL   Lab Results  Component Value Date   GLUCAP 168 (H) 03/25/2020   HGBA1C 10.3 (H) 03/24/2020    Review of Glycemic Control Results for [name] [name], [name] [name] (MRN [id number]) as of 03/25/2020 09:41  Ref. Range 03/25/2020 08:45  Sodium Latest Ref Range: 135 - 145 mmol/L 134 (L)  Potassium Latest Ref Range: 3.5 - 5.1 mmol/L 3.2 (L)  Chloride Latest Ref Range: 98 - 111 mmol/L 111  CO2 Latest Ref Range: 22 - 32 mmol/L 15 (L)  Glucose Latest Ref Range: 70 - 99 mg/dL 163 (H)  BUN Latest Ref Range: 8 - 23 mg/dL 41 (H)  Creatinine Latest Ref Range: 0.61 - 1.24 mg/dL 4.13 (H)  Calcium Latest Ref Range: 8.9 - 10.3 mg/dL 7.5 (L)  Anion gap Latest Ref Range: 5 - 15  8   CVA/Diagnosis DKA per ED physician  Diabetes history: DM 2 Outpatient Diabetes medications: Lantus 12 units, Humalog 1-7 units tid Current orders for Inpatient glycemic control: IV insulin Endotool  A1c 10.3% on 6/12 (medication noncompliance listed in chart) Glucose 809 on presentation  Inpatient Diabetes Program Recommendations:    Recommend IV insulin for now  CO2 -15 indicating acidosis Consider obtaining beta hydroxybutyric acid  DM coordinator will evaluate pt when appropriate and see with A1c level.  Thanks,  Tama Headings RN, MSN, BC-ADM Inpatient Diabetes Coordinator Team Pager [phone] (8a-5p)

## 2020-03-25 NOTE — Progress Notes (Signed)
MD was paged to put an order for KBU from MRI . MD called back and agreed to put in the order. Awaiting order.

## 2020-03-25 NOTE — Progress Notes (Signed)
Followed up with Dr. Ouida Sills about d/c of insulin drip as pt. has been in range the last 2 hours. DO stated to leave rate as is until pt. can be assessed by DO and follow-up with patient's sister. Insulin drip stopped at 1305.
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
## 2020-03-26 ENCOUNTER — Telehealth: Payer: Self-pay | Admitting: Family Medicine

## 2020-03-26 LAB — BASIC METABOLIC PANEL
Anion gap: 7 (ref 5–15)
BUN: 35 mg/dL — ABNORMAL HIGH (ref 8–23)
CO2: 17 mmol/L — ABNORMAL LOW (ref 22–32)
Calcium: 7.8 mg/dL — ABNORMAL LOW (ref 8.9–10.3)
Chloride: 113 mmol/L — ABNORMAL HIGH (ref 98–111)
Creatinine, Ser: 3.8 mg/dL — ABNORMAL HIGH (ref 0.61–1.24)
GFR calc Af Amer: 17 mL/min — ABNORMAL LOW (ref >60)
GFR calc non Af Amer: 15 mL/min — ABNORMAL LOW (ref >60)
Glucose, Bld: 105 mg/dL — ABNORMAL HIGH (ref 70–99)
Potassium: 3.4 mmol/L — ABNORMAL LOW (ref 3.5–5.1)
Sodium: 137 mmol/L (ref 135–145)

## 2020-03-26 LAB — CBC
HCT: 27.2 % — ABNORMAL LOW (ref 39.0–52.0)
Hemoglobin: 8.9 g/dL — ABNORMAL LOW (ref 13.0–17.0)
MCH: 28.9 pg (ref 26.0–34.0)
MCHC: 32.7 g/dL (ref 30.0–36.0)
MCV: 88.3 fL (ref 80.0–100.0)
Platelets: 201 10*3/uL (ref 150–400)
RBC: 3.08 MIL/uL — ABNORMAL LOW (ref 4.22–5.81)
RDW: 14.3 % (ref 11.5–15.5)
WBC: 6.1 10*3/uL (ref 4.0–10.5)
nRBC: 0 % (ref 0.0–0.2)

## 2020-03-26 LAB — GLUCOSE, CAPILLARY
Glucose-Capillary: 118 mg/dL — ABNORMAL HIGH (ref 70–99)
Glucose-Capillary: 146 mg/dL — ABNORMAL HIGH (ref 70–99)
Glucose-Capillary: 148 mg/dL — ABNORMAL HIGH (ref 70–99)
Glucose-Capillary: 205 mg/dL — ABNORMAL HIGH (ref 70–99)
Glucose-Capillary: 61 mg/dL — ABNORMAL LOW (ref 70–99)

## 2020-03-26 MED ORDER — POTASSIUM CHLORIDE CRYS ER 20 MEQ PO TBCR
20.0000 meq | EXTENDED_RELEASE_TABLET | Freq: Once | ORAL | Status: AC
  Administered 2020-03-26: 20 meq via ORAL
  Filled 2020-03-26: qty 1
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
## 2020-03-26 NOTE — Progress Notes (Signed)
Inpatient Diabetes Program Recommendations  AACE/ADA: New Consensus Statement on Inpatient Glycemic Control (2015)  Target Ranges:  Prepandial:   less than 140 mg/dL      Peak postprandial:   less than 180 mg/dL (1-2 hours)      Critically ill patients:  140 - 180 mg/dL   Lab Results  Component Value Date   GLUCAP 118 (H) 03/26/2020   HGBA1C 10.3 (H) 03/24/2020    Review of Glycemic Control Results for GERAL, COKER (MRN [id number]) as of 03/26/2020 13:07  Ref. Range 03/26/2020 03:41 03/26/2020 08:10 03/26/2020 10:44  Glucose-Capillary Latest Ref Range: 70 - 99 mg/dL 148 (H) 61 (L) 118 (H)   Diabetes history:  DM2  Outpatient Diabetes medications:  Lantus 12 units daily Humalog 1-7 units tid  Current orders for Inpatient glycemic control:  Lantus 12 units daily  Novolog 0-9 units tid  Inpatient Diabetes Program Recommendations:     Lantus 10 units (80% of home dose)  Will continue to follow while inpatient.  Thank you, Reche Dixon, RN, BSN Diabetes Coordinator Inpatient Diabetes Program [phone] (team pager from 8a-5p)

## 2020-03-26 NOTE — Progress Notes (Signed)
Called [name] [name], Maximilian's sister to see her thoughts on bringing palliative on board to discuss patient's goals of care and to see what support may be provided to assist them as caretakers.  Patient sister states she is taking care of their mother who is 3 as well as a family member who has Down syndrome at home alongside the help of her other sister.  Patient's sister states she would be very interested in any support that might be able to be provided and thinks bringing palliative on board may be very helpful for her and her family.  Patient's sister states she would appreciate daily updates and states that tomorrow, 6/15 she will be at work but available by cell phone at [phone].

## 2020-03-26 NOTE — Evaluation (Signed)
Occupational Therapy Evaluation Patient Details Name: [name] [name] MRN: [id number] DOB: [birth year] Today's Date: 03/26/2020    History of Present Illness 74 y.o. male with PMH significant for T2DM, hypertension, CKD 4, history of DVT, history of CVA, EtOH use, cognitive decline, poor social situation presented to ED 03/24/20 with strokelike symptoms including slurred speech, Rt gaze preference.  Was also found to be hyperglycemic (CBG 876). CT head no acute findings. MRI cancelled as focal deficits resolved as his blood sugar level decreased.    Clinical Impression   PTA pt reports being independent in ADLs, sister helping with IADLs, not driving or using device to ambulate. Admitted for above and treated for problem list below. Pt A&Ox2 disoriented to time and situation. Decreased safety awareness and awareness of deficits during eval reporting he was okay to go home by himself. Pt required min A with ADLs due to generalized weakness and unsteadiness on feet with multimodal cues for sequencing and safety. Pt required min A with transfers and ambulation due to deficits in balance - attempted to use RW but pt unsafe with walker. Believe pt would benefit from skilled OT services acutely and at the SNF level to increase independence with ADLs and return to PLOF.      Follow Up Recommendations  SNF;Supervision/Assistance - 24 hour    Equipment Recommendations  Other (comment) (RW)    Recommendations for Other Services       Precautions / Restrictions Precautions Precautions: Fall Restrictions Weight Bearing Restrictions: No      Mobility Bed Mobility Overal bed mobility: Modified Independent Bed Mobility: Supine to Sit;Sit to Supine     Supine to sit: Supervision Sit to supine: Supervision   General bed mobility comments: Pt able to get to supine<>sit EOB with supervision and verbal cues to scoot to EOB  Transfers Overall transfer level: Needs assistance Equipment used: Rolling  walker (2 wheeled);1 person hand held assist Transfers: Sit to/from Stand Sit to Stand: Min assist         General transfer comment: Pt able sit<>stand from EOB with Min A. Tried RW with verbal cues, but unsafe with walker    Balance Overall balance assessment: Needs assistance Sitting-balance support: No upper extremity supported;Feet supported Sitting balance-Leahy Scale: Fair     Standing balance support: Single extremity supported;Bilateral upper extremity supported;During functional activity Standing balance-Leahy Scale: Poor Standing balance comment: needed min A to steady during ambulation                           ADL either performed or assessed with clinical judgement   ADL Overall ADL's : Needs assistance/impaired     Grooming: Wash/dry hands;Minimal assistance;Cueing for sequencing;Standing Grooming Details (indicate cue type and reason): Pt washed and dried hands at sink with min cues for sequencing and min A for steady Upper Body Bathing: Minimal assistance;Cueing for sequencing;Sitting Upper Body Bathing Details (indicate cue type and reason): Min A for reaching back and require min verbal cues Lower Body Bathing: Cueing for safety;Sitting/lateral leans;Moderate assistance Lower Body Bathing Details (indicate cue type and reason): Min A to reach feet and perineal area safely and min cues for safety and sequencing while seated  Upper Body Dressing : Minimal assistance;Sitting Upper Body Dressing Details (indicate cue type and reason): Min A to obtain clothes and assist with sequencing due to weakness Lower Body Dressing: Cueing for safety;Cueing for sequencing;Sit to/from stand;Minimal assistance Lower Body Dressing Details (indicate cue type and reason):  Min A with threading feet through legs and steadying for sit<>stand Toilet Transfer: Minimal assistance;Ambulation;Regular Glass blower/designer Details (indicate cue type and reason): Min A for steady  sit<>stand Toileting- Clothing Manipulation and Hygiene: Minimal assistance;Sit to/from stand Toileting - Clothing Manipulation Details (indicate cue type and reason): Min A for steady with stand     Functional mobility during ADLs: Minimal assistance;Rolling walker General ADL Comments: Pt requires min A with ADLs due to general weakness and unsteadiness with cueing for safety and sequencing      Vision Baseline Vision/History: No visual deficits Patient Visual Report: No change from baseline Vision Assessment?: Vision impaired- to be further tested in functional context     Perception     Praxis      Pertinent Vitals/Pain Pain Assessment: No/denies pain     Hand Dominance Right   Extremity/Trunk Assessment Upper Extremity Assessment Upper Extremity Assessment: Generalized weakness   Lower Extremity Assessment Lower Extremity Assessment: Defer to PT evaluation   Cervical / Trunk Assessment Cervical / Trunk Assessment: Normal   Communication Communication Communication: Expressive difficulties   Cognition Arousal/Alertness: Awake/alert Behavior During Therapy: Flat affect;Impulsive Overall Cognitive Status: Impaired/Different from baseline Area of Impairment: Orientation;Memory;Following commands;Safety/judgement;Problem solving                 Orientation Level: Disoriented to;Time;Situation   Memory: Decreased recall of precautions;Decreased short-term memory Following Commands: Follows one step commands with increased time;Follows multi-step commands inconsistently Safety/Judgement: Decreased awareness of safety;Decreased awareness of deficits   Problem Solving: Slow processing;Difficulty sequencing;Requires verbal cues;Requires tactile cues General Comments: oriented to self and place, unaware of deficits and decreased safety awareness - especialyl with walker.   General Comments  Pt reported he "didn't feel right" but when asked why he didn't feel right  he replied, "because I'm not home."    Exercises     Shoulder Instructions      Home Living Family/patient expects to be discharged to:: Unsure Living Arrangements: Alone Available Help at Discharge: Family;Available PRN/intermittently (sister checks on him and assists in IADLs) Type of Home: Apartment Home Access: Elevator     Home Layout: One level     Bathroom Shower/Tub: Occupational psychologist: Standard     Home Equipment: None   Additional Comments:  (pt denies needing using any DME)      Prior Functioning/Environment Level of Independence: Independent  Gait / Transfers Assistance Needed: reports independent in ambulation but unsure of accuracy of report ADL's / Homemaking Assistance Needed: pt reports independence in all ADLs Communication / Swallowing Assistance Needed: speech slurred and garbled somewhat difficult to understand Comments: pt reports he no longer drives because he does not feel it is safe. Sister gets groceries and assists with meds.        OT Problem List: Decreased strength;Decreased activity tolerance;Impaired balance (sitting and/or standing);Impaired vision/perception;Decreased coordination;Decreased cognition;Decreased safety awareness;Decreased knowledge of use of DME or AE;Decreased knowledge of precautions      OT Treatment/Interventions: Self-care/ADL training;Therapeutic exercise;Energy conservation;DME and/or AE instruction;Therapeutic activities;Cognitive remediation/compensation;Visual/perceptual remediation/compensation;Patient/family education;Balance training    OT Goals(Current goals can be found in the care plan section) Acute Rehab OT Goals Patient Stated Goal: wants to go home OT Goal Formulation: With patient Time For Goal Achievement: 04/09/20 Potential to Achieve Goals: Good  OT Frequency: Min 2X/week   Barriers to D/C: Decreased caregiver support  Pt needs 24/7 support to go home. Sister is available PRN        Co-evaluation  AM-PAC OT "6 Clicks" Daily Activity     Outcome Measure Help from another person eating meals?: None Help from another person taking care of personal grooming?: A Little Help from another person toileting, which includes using toliet, bedpan, or urinal?: A Little Help from another person bathing (including washing, rinsing, drying)?: A Little Help from another person to put on and taking off regular upper body clothing?: A Little Help from another person to put on and taking off regular lower body clothing?: A Little 6 Click Score: 19   End of Session Equipment Utilized During Treatment: Gait belt;Rolling walker Nurse Communication: Mobility status  Activity Tolerance: Patient tolerated treatment well Patient left: in bed;with call bell/phone within reach;with bed alarm set;with nursing/sitter in room  OT Visit Diagnosis: Unsteadiness on feet (R26.81);Other abnormalities of gait and mobility (R26.89);Muscle weakness (generalized) (M62.81);Other symptoms and signs involving cognitive function                Time: 1351 (Simultaneous filing. User may not have seen previous data.)-1412 (Simultaneous filing. User may not have seen previous data.) OT Time Calculation (min): 21 min (Simultaneous filing. User may not have seen previous data.) Charges:  OT General Charges $OT Visit: 1 Visit OT Evaluation $OT Eval Moderate Complexity: 1 Mod  Ariez Neilan/OTS  Cottrell Gentles 03/26/2020, 3:09 PM

## 2020-03-26 NOTE — TOC Initial Note (Signed)
Transition of Care [facility]) - Initial/Assessment Note    Patient Details  Name: [name] [name] MRN: [id number] Date of Birth: [birth year]  Transition of [facility]) CM/SW Contact:    Pollie Friar, RN Phone Number: 03/26/2020, 3:13 PM  Clinical Narrative:                 Pt is from home alone. PT/OT recommending SNF and pt is refusing. CM states his sister: [name] comes in daily and provides his medications. CM asked about speaking to her and he voiced approval. CM has reached out to [name] and she feels the patient needs SNF rehab. She states he fell at home multiple times last week. She states he is hard to deal with at home and will not have 24 hour supervision at home. She asked that the patient be faxed out for SNF and she will come to the hospital and speak with him about rehab.  TOC following.  Expected Discharge Plan: Skilled Nursing Facility Barriers to Discharge: Continued Medical Work up   Patient Goals and CMS Choice   CMS Medicare.gov Compare Post Acute Care list provided to:: Patient Choice offered to / list presented to : Patient, Sibling  Expected Discharge Plan and Services Expected Discharge Plan: [place] In-house Referral: Clinical Social Work Discharge Planning Services: CM Consult Post Acute Care Choice: [place] Living arrangements for the past 2 months: Apartment                                      Prior Living Arrangements/Services Living arrangements for the past 2 months: Apartment Lives with:: Self Patient language and need for interpreter reviewed:: Yes Do you feel safe going back to the place where you live?: Yes      Need for Family Participation in Patient Care: Yes (Comment) Care giver support system in place?: No (comment) Current home services: DME Criminal Activity/Legal Involvement Pertinent to Current Situation/Hospitalization: No - Comment as needed  Activities of Daily Living      Permission  Sought/Granted                  Emotional Assessment Appearance:: Appears stated age Attitude/Demeanor/Rapport: Aggressive (Verbally and/or physically), Angry, Reactive Affect (typically observed): Agitated, Angry Orientation: : Oriented to Self, Oriented to Place, Oriented to Situation   Psych Involvement: No (comment)  Admission diagnosis:  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 PVD (peripheral vascular disease) ([place]) 02/02/2020  . Diabetes mellitus type I ([place]) 01/03/2020  . Type 1 diabetes mellitus with hyperglycemia ([name]) 01/03/2020  . 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 History of hypoglycemia 12/08/2019  . Poor social situation 12/08/2019  . CKD (chronic kidney disease) stage 4, GFR 15-29 ml/min ([name]) 12/08/2019  . Hypoglycemia 11/28/2019  . CVA (cerebral vascular accident) ([place]) 11/22/2019  . Cognitive decline 11/22/2019  . Metabolic encephalopathy 10/21/3233  . ETOH abuse 05/17/2019  . Chronic diastolic CHF (congestive heart failure) ([place]) 05/17/2019  . DKA (diabetic ketoacidoses) ([place]) 05/17/2019  . Fall 10/03/2018  . Uncontrolled type 2 diabetes mellitus with hyperglycemia, with long-term current use of insulin ([place]) 10/03/2018  . Chest wall  contusion 10/03/2018  . Underweight 11/18/2017  . Anemia 11/18/2017  . DKA, type 2 ([place]) 08/01/2017  . History of cerebrovascular accident (CVA) in adulthood 07/25/2017  . HTN (hypertension) 07/25/2017  . Depression with anxiety 07/25/2017  . Left ventricular diastolic dysfunction 85/11/7739  . Dementia ([place]) 07/25/2017  . Brittle diabetes mellitus ([place]) 06/28/2017  .  Hypoglycemia due to insulin 06/27/2017  . Diabetes mellitus, insulin dependent (IDDM), controlled 06/27/2017  . Hypertension 06/27/2017  . Hyperkalemia 01/16/2017  . Acute metabolic encephalopathy 28/78/6767  . Weakness 11/16/2016  . Acute kidney injury superimposed on chronic kidney disease ([place])   . Noncompliance with medication regimen 08/17/2016  . History of DVT (deep vein thrombosis) 07/06/2016  . Vitamin B 12 deficiency 06/18/2016  . Hyperglycemia 05/15/2016  . Diabetic hyperosmolar non-ketotic state ([place]) 05/15/2016  . Hyponatremia 05/15/2016  . Anxiety 05/15/2016  . Narcotic dependency, continuous ([place]) 11/02/2015  . Uncontrolled type 2 diabetes mellitus with diabetic nephropathy, with long-term current use of insulin ([place]) 08/12/2015  . Depression 08/12/2015  . CKD (chronic kidney disease) stage 3, GFR 30-59 ml/min ([name]) 03/20/2014   PCP:  Libby Maw, MD Pharmacy:   [facility], [place] [address] Phone: [phone] Fax: [phone]     Social Determinants of Health (SDOH) Interventions    Readmission Risk Interventions No flowsheet data found.

## 2020-03-26 NOTE — Telephone Encounter (Signed)
Patient's sister called and stated the patient is currently in the hospital, but needs all his current medications refilled to [facility] in [place], [phone].

## 2020-03-26 NOTE — NC FL2 (Signed)
[place] LEVEL OF CARE SCREENING TOOL     IDENTIFICATION  Patient Name: [name] [name] Birthdate: April 28, 1946 Sex: male Admission Date (Current Location): 03/24/2020  [facility] [place] Number:  Herbalist and Address:  [facility], [place] [address], [place], [place] [address]      Provider Number: [id number]  Attending Physician Name and Address:  Lind Covert, MD  Relative Name and Phone Number:       Current Level of Care: Hospital Recommended Level of Care: [place] Prior Approval Number:    Date Approved/Denied:   PASRR Number: [id number]  Discharge Plan: SNF    Current Diagnoses: Patient Active Problem List   Diagnosis Date Noted  . 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 PVD (peripheral vascular disease) ([place]) 02/02/2020  . Diabetes mellitus type I ([place]) 01/03/2020  . Type 1 diabetes mellitus with hyperglycemia ([place]) 01/03/2020  . 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 History of hypoglycemia 12/08/2019  . Poor social situation 12/08/2019  . CKD (chronic kidney disease) stage 4, GFR 15-29 ml/min ([name]) 12/08/2019  . Hypoglycemia 11/28/2019  . CVA (cerebral vascular accident) ([place]) 11/22/2019  . Cognitive decline 11/22/2019  . Metabolic encephalopathy 18/29/9371  . ETOH abuse 05/17/2019  . Chronic diastolic CHF (congestive heart failure) ([place]) 05/17/2019  . DKA (diabetic ketoacidoses) ([place]) 05/17/2019  . Fall 10/03/2018  . Uncontrolled type 2 diabetes mellitus with hyperglycemia, with long-term current use of insulin ([place]) 10/03/2018  . Chest wall contusion 10/03/2018  . Underweight 11/18/2017  . Anemia 11/18/2017  . DKA, type 2 ([place]) 08/01/2017  . History of cerebrovascular accident (CVA) in adulthood 07/25/2017  . HTN (hypertension) 07/25/2017  . Depression with anxiety 07/25/2017  . Left ventricular diastolic dysfunction 69/67/8938  . Dementia  ([place]) 07/25/2017  . 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 Hypoglycemia due to insulin 06/27/2017  . Diabetes mellitus, insulin dependent (IDDM), controlled 06/27/2017  . Hypertension 06/27/2017  . Hyperkalemia 01/16/2017  . Acute metabolic encephalopathy 07/29/5101  . Weakness 11/16/2016  . Acute kidney injury superimposed on chronic kidney disease ([place])   . Noncompliance with medication regimen 08/17/2016  . History of DVT (deep vein thrombosis) 07/06/2016  . Vitamin B 12 deficiency 06/18/2016  . Hyperglycemia 05/15/2016  . Diabetic hyperosmolar non-ketotic state ([place]) 05/15/2016  . Hyponatremia 05/15/2016  . Anxiety 05/15/2016  . Narcotic dependency, continuous ([place]) 11/02/2015  . Uncontrolled type 2 diabetes mellitus with diabetic nephropathy, with long-term current use of insulin ([employer] [employer]) 08/12/2015  . Depression 08/12/2015  . CKD (chronic kidney disease) stage 3, GFR 30-59 ml/min ([name]) 03/20/2014    Orientation RESPIRATION BLADDER Height & Weight     Self, Situation, Place  Normal Incontinent Weight:   Height:     BEHAVIORAL SYMPTOMS/MOOD NEUROLOGICAL BOWEL NUTRITION STATUS      Continent Diet (Carb modified with thin liquids)  AMBULATORY STATUS COMMUNICATION OF NEEDS Skin   Limited Assist Verbally Normal (dry skin)                       Personal Care Assistance Level of Assistance  Bathing, Feeding, Dressing Bathing Assistance: Maximum assistance Feeding assistance: Limited assistance Dressing Assistance: Limited assistance     Functional Limitations Info  Sight, Hearing, Speech Sight Info: Adequate Hearing Info: Adequate Speech Info: Impaired (slur)    SPECIAL CARE FACTORS FREQUENCY  PT (By licensed PT), OT (By licensed OT), Speech therapy  PT Frequency: 5x/wk OT Frequency: 5x/wk     Speech Therapy Frequency: 3x/wk      Contractures Contractures Info: Not present    Additional Factors Info  Code Status, Allergies, Insulin Sliding Scale  Code Status Info: Full Allergies Info: NKA   Insulin Sliding Scale Info: Novolog 0-9 units SQ Three times a day       Current Medications (03/26/2020):  This is the current hospital active medication list Current Facility-Administered Medications  Medication Dose Route Frequency Provider Last Rate Last Admin  . 0.9 %  sodium chloride infusion   Intravenous Continuous Brimage, Vondra, DO 75 mL/hr at 03/26/20 1411 New Bag at 03/26/20 1411  . acetaminophen (TYLENOL) tablet 650 mg  650 mg Oral Q4H PRN Gifford Shave, MD       Or  . acetaminophen (TYLENOL) 160 MG/5ML solution 650 mg  650 mg Per Tube Q4H PRN Gifford Shave, MD       Or  . acetaminophen (TYLENOL) suppository 650 mg  650 mg Rectal Q4H PRN Gifford Shave, MD      . amLODipine (NORVASC) tablet 10 mg  10 mg Oral Daily Milus Banister C, DO   10 mg at 03/26/20 1039  . aspirin EC tablet 81 mg  81 mg Oral Daily Greta Doom, MD   81 mg at 03/26/20 1038  . atorvastatin (LIPITOR) tablet 40 mg  40 mg Oral Daily Brimage, Vondra, DO   40 mg at 03/26/20 1038  . Chlorhexidine Gluconate Cloth 2 % PADS 6 each  6 each Topical Daily Lind Covert, MD   6 each at 03/26/20 1039  . heparin injection 5,000 Units  5,000 Units Subcutaneous Q8H Gifford Shave, MD   5,000 Units at 03/26/20 1412  . hydrALAZINE (APRESOLINE) tablet 50 mg  50 mg Oral Q8H Anderson, Hannah C, DO   50 mg at 03/26/20 1411  . insulin aspart (novoLOG) injection 0-9 Units  0-9 Units Subcutaneous TID WC Milus Banister C, DO   7 Units at 03/25/20 1542  . insulin glargine (LANTUS) injection 12 Units  12 Units Subcutaneous QHS Daisy Floro, DO   12 Units at 03/25/20 2130     Discharge Medications: Please see discharge summary for a list of discharge medications.  Relevant Imaging Results:  Relevant Lab Results:   Additional Information KPT:[id number]  Pollie Friar, RN

## 2020-03-26 NOTE — Progress Notes (Signed)
Hypoglycemic Event  CBG:61  Treatment: 4 oz juice/soda  Symptoms: None  Follow-up CBG: Time:1044 CBG Result:118  Possible Reasons for Event: Unknown  Comments/MD notified: NT didn't inform RN of pt CBG but reported MD was in room and witnessed the CBG results. Pt assessed at 0830 and witnessed to be asymptomatic, denies any discomfort. Pt was sitting up in bed and eating breakfast at time. CBG rechecked to be 118. Pt resting comfortably in bed with call light within reach. Will continue to closely monitor. Francis Gaines Bryker Fletchall RN    Yavonne Kiss

## 2020-03-27 DIAGNOSIS — Z515 Encounter for palliative care: Secondary | ICD-10-CM

## 2020-03-27 DIAGNOSIS — Z7189 Other specified counseling: Secondary | ICD-10-CM

## 2020-03-27 LAB — BASIC METABOLIC PANEL
Anion gap: 6 (ref 5–15)
BUN: 33 mg/dL — ABNORMAL HIGH (ref 8–23)
CO2: 18 mmol/L — ABNORMAL LOW (ref 22–32)
Calcium: 7.7 mg/dL — ABNORMAL LOW (ref 8.9–10.3)
Chloride: 116 mmol/L — ABNORMAL HIGH (ref 98–111)
Creatinine, Ser: 3.39 mg/dL — ABNORMAL HIGH (ref 0.61–1.24)
GFR calc Af Amer: 19 mL/min — ABNORMAL LOW (ref >60)
GFR calc non Af Amer: 17 mL/min — ABNORMAL LOW (ref >60)
Glucose, Bld: 112 mg/dL — ABNORMAL HIGH (ref 70–99)
Potassium: 3.6 mmol/L (ref 3.5–5.1)
Sodium: 140 mmol/L (ref 135–145)

## 2020-03-27 LAB — GLUCOSE, CAPILLARY
Glucose-Capillary: 159 mg/dL — ABNORMAL HIGH (ref 70–99)
Glucose-Capillary: 161 mg/dL — ABNORMAL HIGH (ref 70–99)
Glucose-Capillary: 188 mg/dL — ABNORMAL HIGH (ref 70–99)
Glucose-Capillary: 68 mg/dL — ABNORMAL LOW (ref 70–99)
Glucose-Capillary: 78 mg/dL (ref 70–99)

## 2020-03-27 LAB — CBC
HCT: 26.4 % — ABNORMAL LOW (ref 39.0–52.0)
Hemoglobin: 8.7 g/dL — ABNORMAL LOW (ref 13.0–17.0)
MCH: 29.6 pg (ref 26.0–34.0)
MCHC: 33 g/dL (ref 30.0–36.0)
MCV: 89.8 fL (ref 80.0–100.0)
Platelets: 185 10*3/uL (ref 150–400)
RBC: 2.94 MIL/uL — ABNORMAL LOW (ref 4.22–5.81)
RDW: 14.4 % (ref 11.5–15.5)
WBC: 5.8 10*3/uL (ref 4.0–10.5)
nRBC: 0 % (ref 0.0–0.2)

## 2020-03-27 NOTE — Telephone Encounter (Signed)
I spoke with pt's sister, and she said she will call office back to schedule an appointment, once pt is discharged from hospital.

## 2020-03-27 NOTE — Telephone Encounter (Signed)
Needs to be seen for hospital discharge follow up.

## 2020-03-27 NOTE — Telephone Encounter (Signed)
Plz schedule pt for a hospital follow-up per Dr. Elease Etienne dmf

## 2020-03-27 NOTE — Progress Notes (Signed)
Physical Therapy Treatment Patient Details Name: [name] [name] MRN: [id number] DOB: December 23, 1945 Today's Date: 03/27/2020    History of Present Illness 74 y.o. male with PMH significant for T2DM, hypertension, CKD 4, history of DVT, history of CVA, EtOH use, cognitive decline, poor social situation presented to ED 03/24/20 with strokelike symptoms including slurred speech, Rt gaze preference.  Was also found to be hyperglycemic (CBG 876). CT head no acute findings. MRI cancelled as focal deficits resolved as his blood sugar level decreased.     PT Comments    Patient seen for mobility progression. Pt continues to present with generalized weakness, impaired balance, and cognitive deficits increasing risk for falls. Pt will benefit from further skilled PT services to maximize independence and safety with mobility prior to d/c home alone.     Follow Up Recommendations  SNF;Supervision/Assistance - 24 hour     Equipment Recommendations  Rolling walker with 5" wheels    Recommendations for Other Services OT consult;Speech consult     Precautions / Restrictions Precautions Precautions: Fall Restrictions Weight Bearing Restrictions: No    Mobility  Bed Mobility Overal bed mobility: Modified Independent Bed Mobility: Supine to Sit;Sit to Supine     Supine to sit: Supervision Sit to supine: Supervision   General bed mobility comments: supervision for safety; use of rail and increased time/effort   Transfers Overall transfer level: Needs assistance Equipment used: None Transfers: Sit to/from Stand Sit to Stand: Min assist;Mod assist         General transfer comment: pt stood from EOB with bilat LE braced against side of bed and holding onto bed rail; pt denied need for use of RW in standing however agreeable to use   Ambulation/Gait Ambulation/Gait assistance: Mod assist;Min assist Gait Distance (Feet): 140 Feet Assistive device: Rolling walker (2 wheeled) Gait  Pattern/deviations: Step-through pattern;Decreased stride length;Drifts right/left;Trunk flexed     General Gait Details: grossly mod A needed; assistance required for balance and managing RW; pt with very flexed posture and max cues needed for upright posture and safe use of AD; pt drifts R and L and with multiple near LOB   Stairs             Wheelchair Mobility    Modified Rankin (Stroke Patients Only)       Balance Overall balance assessment: Needs assistance Sitting-balance support: No upper extremity supported;Feet supported Sitting balance-Leahy Scale: Fair     Standing balance support: Bilateral upper extremity supported;During functional activity Standing balance-Leahy Scale: Poor                              Cognition Arousal/Alertness: Awake/alert Behavior During Therapy: Flat affect;Impulsive Overall Cognitive Status: Impaired/Different from baseline Area of Impairment: Orientation;Memory;Following commands;Safety/judgement;Problem solving                 Orientation Level: Disoriented to;Time;Situation   Memory: Decreased recall of precautions;Decreased short-term memory Following Commands: Follows one step commands with increased time;Follows multi-step commands inconsistently Safety/Judgement: Decreased awareness of safety;Decreased awareness of deficits   Problem Solving: Slow processing;Difficulty sequencing;Requires verbal cues;Requires tactile cues General Comments: pt with poor insight into deficits and easily agitated when given cues       Exercises      General Comments        Pertinent Vitals/Pain Pain Assessment: No/denies pain    Home Living  Prior Function            PT Goals (current goals can now be found in the care plan section) Acute Rehab PT Goals Patient Stated Goal: wants to go home Progress towards PT goals: Progressing toward goals    Frequency    Min  3X/week      PT Plan Current plan remains appropriate    Co-evaluation              AM-PAC PT "6 Clicks" Mobility   Outcome Measure  Help needed turning from your back to your side while in a flat bed without using bedrails?: A Little Help needed moving from lying on your back to sitting on the side of a flat bed without using bedrails?: A Little Help needed moving to and from a bed to a chair (including a wheelchair)?: A Lot Help needed standing up from a chair using your arms (e.g., wheelchair or bedside chair)?: A Lot Help needed to walk in hospital room?: A Lot Help needed climbing 3-5 steps with a railing? : Total 6 Click Score: 13    End of Session Equipment Utilized During Treatment: Gait belt Activity Tolerance: Patient tolerated treatment well Patient left: in bed;with call bell/phone within reach;with bed alarm set Nurse Communication: Mobility status PT Visit Diagnosis: Unsteadiness on feet (R26.81);Muscle weakness (generalized) (M62.81);Difficulty in walking, not elsewhere classified (R26.2)     Time: 2395-3202 PT Time Calculation (min) (ACUTE ONLY): 16 min  Charges:  $Gait Training: 8-22 mins                     Earney Navy, PTA Acute Rehabilitation Services Pager: [phone] Office: [phone]     Darliss Cheney 03/27/2020, 3:35 PM

## 2020-03-27 NOTE — Progress Notes (Signed)
Pt left IJ central line removed per protocol as ordered. Blue tip remain intact with no discharge noted to the tip. Site clean, and intact and Vaseline and sterile dsg applied to site. Site dsg clean, dry and intact with no drainage or bleeding noted. Will continue to closely monitor pt. Francis Gaines Jeorge Reister RN.

## 2020-03-27 NOTE — Progress Notes (Addendum)
Inpatient Diabetes Program Recommendations  AACE/ADA: New Consensus Statement on Inpatient Glycemic Control (2015)  Target Ranges:  Prepandial:   less than 140 mg/dL      Peak postprandial:   less than 180 mg/dL (1-2 hours)      Critically ill patients:  140 - 180 mg/dL   Lab Results  Component Value Date   GLUCAP 161 (H) 03/27/2020   HGBA1C 10.3 (H) 03/24/2020    Review of Glycemic Control Results for [name] [name], [name] [name] (MRN [id number]) as of 03/27/2020 13:48  Ref. Range 03/26/2020 00:06 03/26/2020 03:41 03/26/2020 08:10  Glucose-Capillary Latest Ref Range: 70 - 99 mg/dL 205 (H) 148 (H) 61 (L)   Results for [name] [name], [name] [name] (MRN [id number]) as of 03/27/2020 13:48  Ref. Range 03/26/2020 10:44 03/26/2020 15:57 03/27/2020 00:02 03/27/2020 07:49 03/27/2020 11:58  Glucose-Capillary Latest Ref Range: 70 - 99 mg/dL 118 (H) 146 (H) 159 (H) 68 (L) 161 (H)   Diabetes history:  DM2  Outpatient Diabetes medications:  Lantus 12 units daily Humalog 1-7 units tid  Current orders for Inpatient glycemic control:  Lantus 12 units daily  Novolog 0-9 units tid  Inpatient Diabetes Program Recommendations:     Lantus 10 units (80% of home dose) to avoid more episodes of hypoglycemia  Note:  Spoke with patient at bedside.  He states his sister, [name] gives him his insulin 2 times a day.  He is unsure of his medications.  Asked permission to call sister.  Spoke with sister, [name] on the phone.  She states [name] goes to his house 2 times a day and gives him his insulin.   She states he takes the above home medications but the Humalog is only 2 times a day.  He used to have a [place] who would give him hid dose at lunch but does not have [place] anymore.  She is only able to drive there twice a day.  She states he does have lows as low as 30 mg/dl at times.  Reviewed patient's current A1c of 10.3% (average bs of 250 mg/dl). Explained what a A1c is and what it measures. Also reviewed goal A1c with patient, importance of good  glucose control @ home, and blood sugar goals.  She states he does drink regular Gingerale and juice.  Educated her on only drinking sugary drinks if he has a low blood sugar of less than 70 mg/dl.  Reviewed The Plate Method and CHO's and which foods contain CHO's.  Expressed importance of limiting CHO's for better glucose control.  Also encouraged her to try to have someone help him with his lunch time insulin.  All questions answered.    Will continue to follow while inpatient.  Thank you, Reche Dixon, RN, BSN Diabetes Coordinator Inpatient Diabetes Program [phone] (team pager from 8a-5p)    Will continue to follow while inpatient.  Thank you, Reche Dixon, RN, BSN Diabetes Coordinator Inpatient Diabetes Program [phone] (team pager from 8a-5p)

## 2020-03-27 NOTE — Progress Notes (Signed)
Family Medicine Teaching Service Daily Progress Note Intern Pager: [phone]  Patient name: [name] [name] Medical record number: [id number] Date of birth: 12-01-1945 Age: 74 y.o. Gender: male  Primary Care Provider: Libby Maw, MD Consultants: Neurology Code Status: Full code  Pt Overview and Major Events to Date:  03/24/2020 admitted for possible stroke  Assessment and Plan: [name] is a 74 year old male presenting with stroke-like symptoms, including slurred speech.  Additionally, was found to be hyperglycemic to 809.  PMH significant for T2DM, hypertension, CKD 4, history of DVT, history of CVA, alcohol use disorder, cognitive decline, and poor social situation.  Altered mentation due to HHS - resolved Patient presented with altered mentation, was found to have glucose 809, was started on normal saline at one half maintenance, then converted overnight to D5 half-normal saline maintenance rate.  Patient CBGs now 148, patient's anion gap is now closed.  Patient was initially worked up for CVA due to his altered mentation and focal neurological deficits (see next problem).  Patient has a history of medication noncompliance, HbA1c on admission 10.3%. -Lantus 12 -Continuous cardiac monitoring -BMP every 12 hours -Strict I's and O's  Challenging social situation PT/OT recommends SNF after evaluating patient, however patient refuses.  Patient thought to be unsafe to discharge home.  Patient does have 2 sisters who assist with his care taking, however they also care for 70 year old brother as well as another family member who has Down syndrome.  Patient with baseline dementia alert and oriented to person and place only at baseline. -Palliative consult  Concern for CVA Patient altered on admission, had been found down at home by family member, noted to have slurred speech and rightward gaze by family member and EMS.  Work-up in emergency department negative for CVA no focal  neurological deficits.  Additionally, patient is poor historian, at baseline oriented to person and place, not time.  Lipid panel this admission within normal limits.  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  Home meds include Lipitor 10 mg daily.  CTA was negative for any acute findings. -Neurology following, appreciate recommendations -Low suspicion for stroke as patient is now back to his baseline after correcting elevated blood sugar, canceling MRI, echo -PT/OT eval and treat -Lipitor increased from 10-40 per neurology recommendations  Acute on chronic kidney disease, 4, slightly improved Creatinine on admission 5.39, improved today at 3.39. Historically baseline 2.3-2.6. -Avoid nephrotoxic medications -Continue to monitor -Will discontinue IV fluid  Hypokalemia Patient with hypokalemia at 3.6. -AM BMP  Hypertension Most recent blood pressure 137/82. Patient taking amlodipine 10 mg daily, hydralazine 50 mg 3 times daily, Lasix 20 mg daily. -Low suspicion for stroke as patient is now back to baseline after correction of hyperglycemia -We will restart amlodipine, hydralazine; continue to hold Lasix, can always restart depending on blood pressures  Depression/Anxiety Patient prescribed Prozac 40 mg daily at home, however states he is currently not taking -Hold home Prozac for now  History alcohol use disorder EtOH on admission <10. -Follow CIWA's  FEN/GI: Carb modified diet PPx: Heparin subQ  Disposition: Pending palliative consult to discuss goals of care and possibilities for safe dispo plan.  Subjective:  Patient enjoying breakfast during my encounter with him this morning.  No complaints.  Patient alert and oriented to person and place, for your patient states it is year 2028.  When asked what brought the patient and he states he had a fall.  Objective: Temp:  [98.1 F (36.7 C)-98.6  F (37 C)] 98.1 F (36.7 C) (06/15 0420) Pulse Rate:   [66-74] 69 (06/15 0420) Resp:  [15-20] 15 (06/15 0420) BP: (128-158)/(77-85) 137/82 (06/15 0420) SpO2:  [99 %-100 %] 100 % (06/15 0420)  Physical Exam: General: Alert and oriented to person and place, not to year or situation (seems to be at baseline) Heart: S1, S2 with no murmurs appreciated Lungs: CTA bilaterally, no wheezing Abdomen: Bowel sounds present, no abdominal pain Skin: Warm and dry Extremities: No lower extremity edema   Laboratory: Recent Labs  Lab 03/24/20 1221 03/24/20 1221 03/24/20 1419 03/26/20 1100 03/27/20 0444  WBC 12.3*  --   --  6.1 5.8  HGB 10.5*   < > 12.9* 8.9* 8.7*  HCT 34.9*   < > 38.0* 27.2* 26.4*  PLT 299  --   --  201 185   < > = values in this interval not displayed.   Recent Labs  Lab 03/24/20 1221 03/24/20 1332 03/25/20 1445 03/26/20 0556 03/27/20 0444  NA 130*   < > 135 137 140  K 4.2   < > 4.8 3.4* 3.6  CL 98   < > 111 113* 116*  CO2 <7*   < > 15* 17* 18*  BUN 54*   < > 41* 35* 33*  CREATININE 5.63*   < > 4.01* 3.80* 3.39*  CALCIUM 8.3*   < > 7.8* 7.8* 7.7*  PROT 6.1*  --   --   --   --   BILITOT 1.3*  --   --   --   --   ALKPHOS 60  --   --   --   --   ALT 14  --   --   --   --   AST 17  --   --   --   --   GLUCOSE 876*   < > 276* 105* 112*   < > = values in this interval not displayed.   Lipid Panel     Component Value Date/Time   CHOL 185 03/25/2020 0213   CHOL 220 (H) 03/16/2019 1021   TRIG 61 03/25/2020 0213   HDL 76 03/25/2020 0213   HDL 61 03/16/2019 1021   CHOLHDL 2.4 03/25/2020 0213   VLDL 12 03/25/2020 0213   LDLCALC 97 03/25/2020 0213   LDLCALC 117 (H) 03/16/2019 1021   LABVLDL 42 (H) 03/16/2019 1021   Ethanol level: Less than 10  Urinalysis    Component Value Date/Time   COLORURINE YELLOW 03/24/2020 1542   APPEARANCEUR CLEAR 03/24/2020 1542   LABSPEC 1.017 03/24/2020 1542   PHURINE 5.0 03/24/2020 1542   GLUCOSEU >=500 (A) 03/24/2020 1542   GLUCOSEU >=1000 (A) 12/08/2019 1139   HGBUR SMALL (A)  03/24/2020 1542   BILIRUBINUR NEGATIVE 03/24/2020 1542   BILIRUBINUR Negative 11/09/2019 0910   KETONESUR 20 (A) 03/24/2020 1542   PROTEINUR >=300 (A) 03/24/2020 1542   UROBILINOGEN 0.2 12/08/2019 1139   NITRITE NEGATIVE 03/24/2020 1542   LEUKOCYTESUR NEGATIVE 03/24/2020 1542    Imaging/Diagnostic Tests: CT Code Stroke CTA Head W/WO (03/24/2020) 1. Negative for large vessel occlusion or other emergent finding.  2. Cervical atherosclerosis without flow limiting stenosis of major neck vessels.  3. Intracranial atherosclerosis, most notably a moderate right P2 segment stenosis.   CT Code Stroke CTA Neck W/WO contrast (03/24/2020): 1. Negative for large vessel occlusion or other emergent finding.  2. Cervical atherosclerosis without flow limiting stenosis of major neck vessels.  3. Intracranial atherosclerosis, most  notably a moderate right P2 segment stenosis.  CT head code stroke without contrast (03/24/2020): Senescent changes without acute finding.  CXR 03/24/20:  Cardiomegaly, vascular congestion. Left central line tip in the SVC.  No pneumothorax.   Lurline Del, DO 03/27/2020, 6:19 AM PGY-2, [facility] Intern pager: [phone], text pages welcome

## 2020-03-27 NOTE — Telephone Encounter (Signed)
Last OV 11/2019 Please see message and advise.  Thank you.

## 2020-03-27 NOTE — Progress Notes (Signed)
This nurse spoke with Casandra Doffing RN. At this time there are no infusions or test ordered that require PIV at this time. This nurse will complete consult. And instructed nurse to notify [facility] with any further needs or orders. VU. Fran Lowes, RN [facility]
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
## 2020-03-28 LAB — GLUCOSE, CAPILLARY
Glucose-Capillary: 128 mg/dL — ABNORMAL HIGH (ref 70–99)
Glucose-Capillary: 199 mg/dL — ABNORMAL HIGH (ref 70–99)
Glucose-Capillary: 213 mg/dL — ABNORMAL HIGH (ref 70–99)
Glucose-Capillary: 222 mg/dL — ABNORMAL HIGH (ref 70–99)
Glucose-Capillary: 44 mg/dL — CL (ref 70–99)
Glucose-Capillary: 68 mg/dL — ABNORMAL LOW (ref 70–99)
Glucose-Capillary: 84 mg/dL (ref 70–99)
Glucose-Capillary: 93 mg/dL (ref 70–99)

## 2020-03-28 LAB — CBC
HCT: 26.6 % — ABNORMAL LOW (ref 39.0–52.0)
Hemoglobin: 8.6 g/dL — ABNORMAL LOW (ref 13.0–17.0)
MCH: 29.2 pg (ref 26.0–34.0)
MCHC: 32.3 g/dL (ref 30.0–36.0)
MCV: 90.2 fL (ref 80.0–100.0)
Platelets: 165 10*3/uL (ref 150–400)
RBC: 2.95 MIL/uL — ABNORMAL LOW (ref 4.22–5.81)
RDW: 14.2 % (ref 11.5–15.5)
WBC: 5.5 10*3/uL (ref 4.0–10.5)
nRBC: 0 % (ref 0.0–0.2)

## 2020-03-28 LAB — BASIC METABOLIC PANEL
Anion gap: 6 (ref 5–15)
BUN: 34 mg/dL — ABNORMAL HIGH (ref 8–23)
CO2: 17 mmol/L — ABNORMAL LOW (ref 22–32)
Calcium: 7.7 mg/dL — ABNORMAL LOW (ref 8.9–10.3)
Chloride: 115 mmol/L — ABNORMAL HIGH (ref 98–111)
Creatinine, Ser: 3.24 mg/dL — ABNORMAL HIGH (ref 0.61–1.24)
GFR calc Af Amer: 21 mL/min — ABNORMAL LOW (ref >60)
GFR calc non Af Amer: 18 mL/min — ABNORMAL LOW (ref >60)
Glucose, Bld: 62 mg/dL — ABNORMAL LOW (ref 70–99)
Potassium: 3.4 mmol/L — ABNORMAL LOW (ref 3.5–5.1)
Sodium: 138 mmol/L (ref 135–145)

## 2020-03-28 LAB — SARS CORONAVIRUS 2 BY RT PCR (HOSPITAL ORDER, PERFORMED IN ~~LOC~~ HOSPITAL LAB): SARS Coronavirus 2: NEGATIVE

## 2020-03-28 MED ORDER — INSULIN GLARGINE 100 UNIT/ML ~~LOC~~ SOLN
8.0000 [IU] | Freq: Every day | SUBCUTANEOUS | Status: DC
  Filled 2020-03-28: qty 0.08

## 2020-03-28 MED ORDER — FUROSEMIDE 20 MG PO TABS
20.0000 mg | ORAL_TABLET | Freq: Every day | ORAL | Status: DC
  Administered 2020-03-28 – 2020-03-29 (×2): 20 mg via ORAL
  Filled 2020-03-28 (×2): qty 1

## 2020-03-28 MED ORDER — INSULIN GLARGINE 100 UNIT/ML ~~LOC~~ SOLN
6.0000 [IU] | Freq: Every day | SUBCUTANEOUS | Status: DC
  Administered 2020-03-28: 6 [IU] via SUBCUTANEOUS
  Filled 2020-03-28 (×2): qty 0.06

## 2020-03-28 MED ORDER — POTASSIUM CHLORIDE CRYS ER 20 MEQ PO TBCR
40.0000 meq | EXTENDED_RELEASE_TABLET | Freq: Once | ORAL | Status: AC
  Administered 2020-03-28: 40 meq via ORAL
  Filled 2020-03-28: qty 2

## 2020-03-28 NOTE — Plan of Care (Signed)
  Problem: Nutrition: Goal: Adequate nutrition will be maintained Outcome: Progressing   Problem: Pain Managment: Goal: General experience of comfort will improve Outcome: Progressing   Problem: Safety: Goal: Ability to remain free from injury will improve Outcome: Progressing   

## 2020-03-28 NOTE — Discharge Summary (Addendum)
I have reviewed the below documentation. I have seen the patient on the day of discharge and agree with the below exam.  In addition to below, I recommend the following at follow up: My edits are below.   Dorris Singh, MD  [facility] Discharge Summary  Patient name: [name] [name] Medical record number: [id number] Date of birth: 1946-04-23 Age: 74 y.o. Gender: male Date of Admission: 03/24/2020  Date of Discharge: 03/29/2020 Admitting Physician: Lind Covert, MD  Primary Care Provider: Libby Maw, MD Consultants: Neurology  Indication for Hospitalization: Altered mental status due to hyperglycemic hyperosmolar syndrome, now resolved.   Discharge Diagnoses/Problem List:  Active Problems:   Hypertension    CKD IV    Type 2 Diabetes    History of depression and anxiety   History of CVA   Disposition: SNF  Discharge Condition: Stable  Discharge Exam:  General: Alert and oriented to person and place--- does not know why he is in hospital, cannot explain situation.  in no apparent distress Heart: S1, S2 with no murmurs appreciated Lungs: CTA bilaterally, no wheezing Abdomen: Bowel sounds present, no abdominal pain Neuro: Alert and oriented to person, place, not year or situation.  CN II through XII intact, fine touch sensation intact in upper and lower extremities bilaterally, strength 5/5 in elbow flexion, extension, grip bilaterally, strength 5/5 in hip flexion bilaterally. Extremities: No lower extremity edema   Brief Hospital Course:  Altered mental status due to hyperglycemia/HHS Patient mid to the hospital on 6/12 with altered mental status, concern for CVA. Neurology was initially consulted after concern for CVA. Patient's initial blood work was significant for greatly elevated blood glucose level of 876. After CVA work-up neurology signed off and patient continued with insulin drip/fluids  to resolve his HHS. As this improved patient's mental status improved to his approximate baseline which is alert and oriented to person, place but not to time or situation.  Due to some bouts of hypoglycemia patient's long-acting insulin was reduced to 6 units/day prior to discharge.  Please use caution if increasing this dosage due to bouts of hypoglycemia.  Challenging social situation The patient improved and got close to time of discharge palliative was brought on at the request of patient's sister due to a challenging social situation. Patient's sister cares for their mother who is 61 years old as well as another family member who has a chronic illness and thought it would be challenging to care for Mr. Schleicher as well. Palliative discussed with him as well as with social work and determined that they would be all right with skilled nursing facility, as Mr. Brandow was not felt to have capacity.  Hypertension: During hospitalization patient had some bouts of hypertension which prompted [place] to increase his hydralazine to 75 mg 3 times per day.  Issues for Follow Up:  1. Recommend follow-up monitor patient's glucose within few days after discharge and use caution if increase in his insulin.  Long-acting insulin reduced to 6 units/day prior to discharge. 2. Patient with AKI on admission which was improving throughout the course of hospitalization, recommend BMP on Monday 6/21.Baseline creatinine ranges between 2-3.  3. Patient's hydralazine was increased to 75 mg 3 times per day for hypertension 4. Patient's atorvastatin was increased to 40 mg/day.  Significant Procedures: None  Significant Labs and Imaging:  Recent Labs  Lab 03/27/20 0444 03/28/20 0650 03/29/20 0526  WBC 5.8 5.5 5.6  HGB 8.7* 8.6* 9.2*  HCT 26.4* 26.6* 27.7*  PLT 185 165 185   Recent Labs  Lab 03/24/20 1221 03/24/20 1332 03/25/20 1445 03/25/20 1445 03/26/20 0556 03/26/20 0556 03/27/20 0444 03/27/20 0444  03/28/20 0650 03/29/20 0526  NA 130*   < > 135  --  137  --  140  --  138 135  K 4.2   < > 4.8   < > 3.4*   < > 3.6   < > 3.4* 4.1  CL 98   < > 111  --  113*  --  116*  --  115* 111  CO2 <7*   < > 15*  --  17*  --  18*  --  17* 18*  GLUCOSE 876*   < > 276*  --  105*  --  112*  --  62* 189*  BUN 54*   < > 41*  --  35*  --  33*  --  34* 34*  CREATININE 5.63*   < > 4.01*  --  3.80*  --  3.39*  --  3.24* 3.37*  CALCIUM 8.3*   < > 7.8*  --  7.8*  --  7.7*  --  7.7* 8.1*  ALKPHOS 60  --   --   --   --   --   --   --   --   --   AST 17  --   --   --   --   --   --   --   --   --   ALT 14  --   --   --   --   --   --   --   --   --   ALBUMIN 2.9*  --   --   --   --   --   --   --   --   --    < > = values in this interval not displayed.     Results/Tests Pending at Time of Discharge: None  Discharge Medications:  Allergies as of 03/29/2020   No Known Allergies     Medication List    STOP taking these medications   FLUoxetine 40 MG capsule Commonly known as: PROZAC   Lantus SoloStar 100 UNIT/ML Solostar Pen Generic drug: insulin glargine Replaced by: insulin glargine 100 UNIT/ML injection     TAKE these medications   Accu-Chek Guide w/Device Kit 1 Device by Does not apply route QID.   amLODipine 10 MG tablet Commonly known as: NORVASC TAKE 1 TABLET ONCE DAILY.   atorvastatin 40 MG tablet Commonly known as: LIPITOR Take 1 tablet (40 mg total) by mouth daily. What changed:   medication strength  how much to take   cholecalciferol 25 MCG (1000 UNIT) tablet Commonly known as: VITAMIN D3 Take 1,000 Units by mouth daily.   cyanocobalamin 1000 MCG tablet Take 1 tablet (1,000 mcg total) by mouth daily.   furosemide 20 MG tablet Commonly known as: LASIX Take 1 tablet (20 mg total) by mouth daily.   glucose blood test strip Commonly known as: True Metrix Blood Glucose Test CHECK BLOOD SUGAR UP TO 4 TIMES A DAY. What changed: additional instructions   glucose blood  test strip Commonly known as: Accu-Chek Guide Use as instructed What changed: Another medication with the same name was changed. Make sure you understand how and when to take each.   Accu-Chek Guide test strip Generic drug: glucose blood  USE AS DIRECTED What changed: Another medication with the same name was changed. Make sure you understand how and when to take each.   hydrALAZINE 25 MG tablet Commonly known as: APRESOLINE Take 3 tablets (75 mg total) by mouth every 8 (eight) hours. What changed:   medication strength  how much to take   insulin glargine 100 UNIT/ML injection Commonly known as: LANTUS Inject 0.06 mLs (6 Units total) into the skin at bedtime. Replaces: Lantus SoloStar 100 UNIT/ML Solostar Pen   insulin lispro 100 UNIT/ML KwikPen Commonly known as: HumaLOG KwikPen Max daily 24 units Per correctional scale What changed:   how much to take  how to take this  when to take this   Insulin Pen Needle 32G X 4 MM Misc 1 Device by Does not apply route in the morning, at noon, in the evening, and at bedtime.   Insulin Syringe-Needle U-100 31G X 5/16" 0.3 ML Misc Commonly known as: BD Insulin Syringe U/F Inject 1 each as directed 2 (two) times daily. as directed   Vitamin D (Ergocalciferol) 1.25 MG (50000 UNIT) Caps capsule Commonly known as: DRISDOL Take 1 capsule (50,000 Units total) by mouth once a week.       Discharge Instructions: Please refer to Patient Instructions section of EMR for full details.  Patient was counseled important signs and symptoms that should prompt return to medical care, changes in medications, dietary instructions, activity restrictions, and follow up appointments.   Follow-Up Appointments:  Contact information for after-discharge care    Destination    [facility] Preferred SNF .   Service: Skilled Nursing Contact information: [address] [place], [place], [place] 03/29/2020, 11:30 AM PGY-1, [place]

## 2020-03-28 NOTE — Progress Notes (Addendum)
Patient's CBG at 1150 is 44. Patient is asymptomatic. Patient was given 8 ounces of orange juice and patient's lunch arrived short time after. CBG rechecked at 1210 and was 68. Patient still eating lunch, including a chocolate chip cookie. CBG rechecked at 1230 and is 84. DO notified. Will continue to monitor.

## 2020-03-28 NOTE — Progress Notes (Signed)
Occupational Therapy Treatment Patient Details Name: [name] [name] MRN: [id number] DOB: 1946/05/12 Today's Date: 03/28/2020    History of present illness 74 y.o. male with PMH significant for T2DM, hypertension, CKD 4, history of DVT, history of CVA, EtOH use, cognitive decline, poor social situation presented to ED 03/24/20 with strokelike symptoms including slurred speech, Rt gaze preference.  Was also found to be hyperglycemic (CBG 876). CT head no acute findings. MRI cancelled as focal deficits resolved as his blood sugar level decreased.    OT comments  Pt received attempting to get OOB reporting urge to urinate despite education on having catheter. Pt completed bed mobility with supervision. Sit<>stand with no AD with MIN A. Pt very unsteady during ambulation to BR with pt reaching out for furniture. Utilized RW with notable balance improvements. Pt complete toileting with MIN A for balance during sit<>stand. Pt continues to present with cognitive impairments in the areas of following commands, safety/judgement and Problem solving. Agree with DC plan below, will follow acutely per POC.   Follow Up Recommendations  SNF;Supervision/Assistance - 24 hour    Equipment Recommendations  Other (comment) (RW)    Recommendations for Other Services      Precautions / Restrictions Precautions Precautions: Fall Restrictions Weight Bearing Restrictions: No       Mobility Bed Mobility Overal bed mobility: Needs Assistance Bed Mobility: Supine to Sit;Sit to Supine     Supine to sit: Supervision Sit to supine: Supervision   General bed mobility comments: supervision for safety; use of rail and increased time/effort   Transfers Overall transfer level: Needs assistance Equipment used: None Transfers: Sit to/from Stand Sit to Stand: Min assist         General transfer comment: MIN A to power into standing from EOB with no AD and to initially steady self    Balance Overall balance  assessment: Needs assistance Sitting-balance support: No upper extremity supported;Feet supported Sitting balance-Leahy Scale: Fair     Standing balance support: Bilateral upper extremity supported;During functional activity Standing balance-Leahy Scale: Poor Standing balance comment: needed min A to steady during ambulation                           ADL either performed or assessed with clinical judgement   ADL Overall ADL's : Needs assistance/impaired                         Toilet Transfer: Minimal assistance;Ambulation;Regular Toilet;RW;Grab bars Toilet Transfer Details (indicate cue type and reason): pt able to ambulate to BR with RW and MIN A for balance, use of grab bars noted Toileting- Clothing Manipulation and Hygiene: Minimal assistance;Sit to/from stand Toileting - Clothing Manipulation Details (indicate cue type and reason): Min A for steady with stand     Functional mobility during ADLs: Minimal assistance;Rolling walker;Cueing for sequencing;Cueing for safety General ADL Comments: pt slightly impulsive attempting to get OOB upon arrival. trialed mobility with no AD with pt reaching for furniture, MIN A for functionalmobility with RW needing assist to manage RW and for balance.     Vision Baseline Vision/History: No visual deficits     Perception     Praxis      Cognition Arousal/Alertness: Awake/alert Behavior During Therapy: Flat affect;Impulsive Overall Cognitive Status: Impaired/Different from baseline Area of Impairment: Following commands;Safety/judgement;Problem solving  Following Commands: Follows one step commands with increased time;Follows multi-step commands inconsistently Safety/Judgement: Decreased awareness of safety;Decreased awareness of deficits   Problem Solving: Slow processing;Difficulty sequencing;Requires verbal cues;Requires tactile cues General Comments: pt with poor insight into  deficits noted to be attempting to exit OOB upon arrival.        Exercises     Shoulder Instructions       General Comments pt noted to be attempting to exit bed upon arrival stating he needs to urinate despite education on having primo fot cathether    Pertinent Vitals/ Pain       Pain Assessment: No/denies pain  Home Living                                          Prior Functioning/Environment              Frequency  Min 2X/week        Progress Toward Goals  OT Goals(current goals can now be found in the care plan section)  Progress towards OT goals: Progressing toward goals  Acute Rehab OT Goals Patient Stated Goal: wants to go home OT Goal Formulation: With patient Time For Goal Achievement: 04/09/20 Potential to Achieve Goals: Good  Plan Discharge plan remains appropriate;Frequency remains appropriate    Co-evaluation                 AM-PAC OT "6 Clicks" Daily Activity     Outcome Measure   Help from another person eating meals?: None Help from another person taking care of personal grooming?: A Little Help from another person toileting, which includes using toliet, bedpan, or urinal?: A Little Help from another person bathing (including washing, rinsing, drying)?: A Little Help from another person to put on and taking off regular upper body clothing?: A Little Help from another person to put on and taking off regular lower body clothing?: A Little 6 Click Score: 19    End of Session Equipment Utilized During Treatment: Rolling walker;Gait belt  OT Visit Diagnosis: Unsteadiness on feet (R26.81);Other abnormalities of gait and mobility (R26.89);Muscle weakness (generalized) (M62.81);Other symptoms and signs involving cognitive function   Activity Tolerance Patient tolerated treatment well   Patient Left in bed;with call bell/phone within reach;with bed alarm set   Nurse Communication Mobility status;Other (comment) (needs  new primo fit)        Time: 5449-2010 OT Time Calculation (min): 12 min  Charges: OT General Charges $OT Visit: 1 Visit OT Treatments $Self Care/Home Management : 8-22 mins  Lanier Clam., COTA/L Acute Rehabilitation Services [phone]    Ihor Gully 03/28/2020, 4:34 PM

## 2020-03-28 NOTE — Progress Notes (Addendum)
Family Medicine Teaching Service Daily Progress Note Intern Pager: [phone]  Patient name: [name] [name] Medical record number: [id number] Date of birth: Jan 05, 1946 Age: 74 y.o. Gender: male  Primary Care Provider: Libby Maw, MD Consultants: Neurology Code Status: Full code  Pt Overview and Major Events to Date:  03/24/2020 admitted for possible stroke  Assessment and Plan: [name] [name] is a 74 year old male presenting with stroke-like symptoms, including slurred speech.  Additionally, was found to be hyperglycemic to 809.  PMH significant for T2DM, hypertension, CKD 4, history of DVT, history of CVA, alcohol use disorder, cognitive decline, and poor social situation.  Altered mentation due to HHS - resolved Patient presented with altered mentation, was found to have glucose 809, was started on normal saline at one half maintenance, then converted overnight to D5 half-normal saline maintenance rate.  Patient CBGs now 148, patient's anion gap is now closed.  Patient was initially worked up for CVA due to his altered mentation and focal neurological deficits (see next problem).  Patient has a history of medication noncompliance, HbA1c on admission 10.3%. -Reduce Lantus to 8u to avoid hypoglycemia. -Continuous cardiac monitoring -BMP every 12 hours -Strict I's and O's  Challenging social situation PT/OT recommends SNF after evaluating patient, however patient refuses.  Patient thought to be unsafe to discharge home.  Patient does have 2 sisters who assist with his care taking, however they also care for 35 year old brother as well as another family member who has Down syndrome.  Patient with baseline dementia alert and oriented to person and place only at baseline. Patient does not have capacity and so after speaking with patient's caretakers palliative was brought on board. -Palliative consult -Per palliative recommendations patient's family requesting SNF placement with outpatient  palliative support.  Concern for CVA-resolved Patient altered on admission, had been found down at home by family member, noted to have slurred speech and rightward gaze by family member and EMS.  Work-up in emergency department negative for CVA no focal neurological deficits.  Additionally, patient is poor historian, at baseline oriented to person and place, not time.  Lipid panel this admission within normal limits.  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  Home meds include Lipitor 10 mg daily.  CTA was negative for any acute findings. -Lipitor increased from 10 to 40 per neurology recommendations  Acute on chronic kidney disease, 4, slightly improved Creatinine on admission 5.39, improved today at 3.24. Historically baseline 2.3-2.6. -Avoid nephrotoxic medications -Continue to monitor -Will discontinue IV fluid  Hypokalemia-resolved Current potassium 3.4 -Replete with 40 K-Dur -AM BMP  Hypertension Most recent blood pressure 163/92. Patient taking amlodipine 10 mg daily, hydralazine 50 mg 3 times daily, Lasix 20 mg daily. -Low suspicion for stroke as patient is now back to baseline after correction of hyperglycemia -We will restart amlodipine, hydralazine; -Restart patient's Lasix 20 mg once daily  Depression/Anxiety Patient prescribed Prozac 40 mg daily at home, however states he is currently not taking -Hold home Prozac for now  History alcohol use disorder EtOH on admission <10. -Follow CIWA's  FEN/GI: Carb modified diet PPx: Heparin subQ  Disposition: Medically clear for discharge. Pending SNF placement  Subjective:  Patient without complaints this morning. Patient alert to his name, location, and year this morning, not to situation.  Objective: Temp:  [98.3 F (36.8 C)-99.6 F (37.6 C)] 98.6 F (37 C) (06/16 0335) Pulse Rate:  [66-79] 72 (06/16 0335) Resp:  [16-20] 16 (06/16 0335) BP: (139-163)/(69-94) 163/92 (06/16 0335)  SpO2:   [95 %-100 %] 98 % (06/16 0335)  Physical Exam: General: Alert and oriented to person, place, year, but not to situation. In no apparent distress Heart: S1, S2 present with no murmurs appreciated Lungs: CTA bilaterally, no wheezing Abdomen: Bowel sounds present, no abdominal pain Skin: Warm and dry Extremities: No lower extremity edema  Laboratory: Recent Labs  Lab 03/24/20 1221 03/24/20 1221 03/24/20 1419 03/26/20 1100 03/27/20 0444  WBC 12.3*  --   --  6.1 5.8  HGB 10.5*   < > 12.9* 8.9* 8.7*  HCT 34.9*   < > 38.0* 27.2* 26.4*  PLT 299  --   --  201 185   < > = values in this interval not displayed.   Recent Labs  Lab 03/24/20 1221 03/24/20 1332 03/25/20 1445 03/26/20 0556 03/27/20 0444  NA 130*   < > 135 137 140  K 4.2   < > 4.8 3.4* 3.6  CL 98   < > 111 113* 116*  CO2 <7*   < > 15* 17* 18*  BUN 54*   < > 41* 35* 33*  CREATININE 5.63*   < > 4.01* 3.80* 3.39*  CALCIUM 8.3*   < > 7.8* 7.8* 7.7*  PROT 6.1*  --   --   --   --   BILITOT 1.3*  --   --   --   --   ALKPHOS 60  --   --   --   --   ALT 14  --   --   --   --   AST 17  --   --   --   --   GLUCOSE 876*   < > 276* 105* 112*   < > = values in this interval not displayed.   Lipid Panel     Component Value Date/Time   CHOL 185 03/25/2020 0213   CHOL 220 (H) 03/16/2019 1021   TRIG 61 03/25/2020 0213   HDL 76 03/25/2020 0213   HDL 61 03/16/2019 1021   CHOLHDL 2.4 03/25/2020 0213   VLDL 12 03/25/2020 0213   LDLCALC 97 03/25/2020 0213   LDLCALC 117 (H) 03/16/2019 1021   LABVLDL 42 (H) 03/16/2019 1021   Ethanol level: Less than 10  Urinalysis    Component Value Date/Time   COLORURINE YELLOW 03/24/2020 1542   APPEARANCEUR CLEAR 03/24/2020 1542   LABSPEC 1.017 03/24/2020 1542   PHURINE 5.0 03/24/2020 1542   GLUCOSEU >=500 (A) 03/24/2020 1542   GLUCOSEU >=1000 (A) 12/08/2019 1139   HGBUR SMALL (A) 03/24/2020 1542   BILIRUBINUR NEGATIVE 03/24/2020 1542   BILIRUBINUR Negative 11/09/2019 0910    KETONESUR 20 (A) 03/24/2020 1542   PROTEINUR >=300 (A) 03/24/2020 1542   UROBILINOGEN 0.2 12/08/2019 1139   NITRITE NEGATIVE 03/24/2020 1542   LEUKOCYTESUR NEGATIVE 03/24/2020 1542    Imaging/Diagnostic Tests: CT Code Stroke CTA Head W/WO (03/24/2020) 1. Negative for large vessel occlusion or other emergent finding.  2. Cervical atherosclerosis without flow limiting stenosis of major neck vessels.  3. Intracranial atherosclerosis, most notably a moderate right P2 segment stenosis.   CT Code Stroke CTA Neck W/WO contrast (03/24/2020): 1. Negative for large vessel occlusion or other emergent finding.  2. Cervical atherosclerosis without flow limiting stenosis of major neck vessels.  3. Intracranial atherosclerosis, most notably a moderate right P2 segment stenosis.  CT head code stroke without contrast (03/24/2020): Senescent changes without acute finding.  CXR 03/24/20:  Cardiomegaly, vascular congestion. Left central  line tip in the SVC.  No pneumothorax.   Lurline Del, DO 03/28/2020, 6:31 AM PGY-1, [place] Intern pager: [phone], text pages welcome

## 2020-03-28 NOTE — TOC Progression Note (Addendum)
Transition of Care [facility]) - Progression Note    Patient Details  Name: [name] [name] MRN: [id number] Date of Birth: December 14, 1945  [facility] CM/SW Contact  Pollie Friar, RN Phone Number: 03/28/2020, 10:46 AM  Clinical Narrative:    MD's note states patient is without capacity to make own medical decisions. CM has updated his sister: [name] [name]. She was provided the bed offers for SNF. She is going to share this information with her family and patients son and choose a SNF rehab and update CM later today.  CM has already submitted for insurance authorization for SNF rehab and await authorization. Pt will require new covid test prior to d/cing to SNF. CM will update MD.  TOC following.  8299: Family selected [facility]. CM has updated [name] [name] with [name] [name] and will update [employer] for auth.    Expected Discharge Plan: [place] Barriers to Discharge: Continued Medical Work up  Expected Discharge Plan and Services Expected Discharge Plan: [place] In-house Referral: Clinical Social Work Discharge Planning Services: CM Consult Post Acute Care Choice: [place] Living arrangements for the past 2 months: Apartment                                       Social Determinants of Health (SDOH) Interventions    Readmission Risk Interventions No flowsheet data found.

## 2020-03-29 DIAGNOSIS — G9341 Metabolic encephalopathy: Secondary | ICD-10-CM

## 2020-03-29 DIAGNOSIS — E109 Type 1 diabetes mellitus without complications: Secondary | ICD-10-CM

## 2020-03-29 DIAGNOSIS — Z8673 Personal history of transient ischemic attack (TIA), and cerebral infarction without residual deficits: Secondary | ICD-10-CM

## 2020-03-29 LAB — CBC
HCT: 27.7 % — ABNORMAL LOW (ref 39.0–52.0)
Hemoglobin: 9.2 g/dL — ABNORMAL LOW (ref 13.0–17.0)
MCH: 29.8 pg (ref 26.0–34.0)
MCHC: 33.2 g/dL (ref 30.0–36.0)
MCV: 89.6 fL (ref 80.0–100.0)
Platelets: 185 10*3/uL (ref 150–400)
RBC: 3.09 MIL/uL — ABNORMAL LOW (ref 4.22–5.81)
RDW: 14.2 % (ref 11.5–15.5)
WBC: 5.6 10*3/uL (ref 4.0–10.5)
nRBC: 0 % (ref 0.0–0.2)

## 2020-03-29 LAB — BASIC METABOLIC PANEL
Anion gap: 6 (ref 5–15)
BUN: 34 mg/dL — ABNORMAL HIGH (ref 8–23)
CO2: 18 mmol/L — ABNORMAL LOW (ref 22–32)
Calcium: 8.1 mg/dL — ABNORMAL LOW (ref 8.9–10.3)
Chloride: 111 mmol/L (ref 98–111)
Creatinine, Ser: 3.37 mg/dL — ABNORMAL HIGH (ref 0.61–1.24)
GFR calc Af Amer: 20 mL/min — ABNORMAL LOW (ref >60)
GFR calc non Af Amer: 17 mL/min — ABNORMAL LOW (ref >60)
Glucose, Bld: 189 mg/dL — ABNORMAL HIGH (ref 70–99)
Potassium: 4.1 mmol/L (ref 3.5–5.1)
Sodium: 135 mmol/L (ref 135–145)

## 2020-03-29 LAB — GLUCOSE, CAPILLARY
Glucose-Capillary: 208 mg/dL — ABNORMAL HIGH (ref 70–99)
Glucose-Capillary: 183 mg/dL — ABNORMAL HIGH (ref 70–99)

## 2020-03-29 MED ORDER — HYDRALAZINE HCL 50 MG PO TABS: 75.0000 mg | ORAL_TABLET | Freq: Three times a day (TID) | ORAL | Status: DC

## 2020-03-29 MED ORDER — HYDRALAZINE HCL 25 MG PO TABS: 75.0000 mg | ORAL_TABLET | Freq: Three times a day (TID) | ORAL | 0 refills | Status: DC

## 2020-03-29 MED ORDER — ATORVASTATIN CALCIUM 40 MG PO TABS: 40.0000 mg | ORAL_TABLET | Freq: Every day | ORAL | 0 refills | Status: DC

## 2020-03-29 MED ORDER — INSULIN GLARGINE 100 UNIT/ML ~~LOC~~ SOLN: 6.0000 [IU] | Freq: Every day | SUBCUTANEOUS | 0 refills | Status: DC

## 2020-03-29 NOTE — Progress Notes (Signed)
Pt. Discharged to [name] via [facility]. Report given by Probation officer to the receiving nurse at the facility [facility]. At the time of discharge pt was A&O x 3, ambulated with one assist with the walker, c/o no pain and had no skin issues. All personal belongings were sent with the pt.

## 2020-03-29 NOTE — Progress Notes (Signed)
PT Cancellation Note  Patient Details Name: [name] [name] MRN: [id number] DOB: [birth year]   Cancelled Treatment:    Reason Eval/Treat Not Completed: Patient declined, no reason specified Attempted to see pt for PT treatment. Pt declined participating in therapy at this time. PT will continue to follow acutely. Noted pt is to d/c to SNF today.  Earney Navy, PTA Acute Rehabilitation Services Pager: [phone] Office: [phone]   03/29/2020, 10:00 AM

## 2020-03-29 NOTE — TOC Transition Note (Signed)
Transition of Care [facility]) - CM/SW Discharge Note   Patient Details  Name: [name] [name] MRN: [id number] Date of Birth: 1946-10-06  Transition of Care [facility]) CM/SW Contact:  Pollie Friar, RN Phone Number: 03/29/2020, 12:48 PM   Clinical Narrative:    Pt discharging to [facility] today. CM has updated the patient and his sister: [name].  D/c packet at the desk and will arrange PTAR for transportation. Bedside RN updated.   Room: 706 Number for report: [phone]   Final next level of care: Skilled Nursing Facility Barriers to Discharge: No Barriers Identified   Patient Goals and CMS Choice   CMS Medicare.gov Compare Post Acute Care list provided to:: Patient Represenative (must comment) Choice offered to / list presented to : Sibling  Discharge Placement              Patient chooses bed at: [facility] Patient to be transferred to facility by: [facility] Name of family member notified: [name] Patient and family notified of of transfer: 03/29/20  Discharge Plan and Services In-house Referral: Clinical Social Work Discharge Planning Services: CM Consult Post Acute Care Choice: [place]                               Social Determinants of Health (SDOH) Interventions     Readmission Risk Interventions No flowsheet data found.

## 2020-03-29 NOTE — Progress Notes (Signed)
Insurance authorization information:  Auth ID: [id number]  Ref #: [id number]  Good: 03/28/20-03/30/20  Cresenciano Genre .  Fax: [phone]

## 2020-04-04 ENCOUNTER — Ambulatory Visit: Payer: Medicare Other | Admitting: Internal Medicine

## 2020-04-04 IMAGING — CR CHEST - 2 VIEW
2 series · 2 of 2 positions shown · non-contrast
Comparison: 10/03/2018

CLINICAL DATA: Hypothenar Bolen syndrome

EXAM:
CHEST - 2 VIEW

[chest lat]
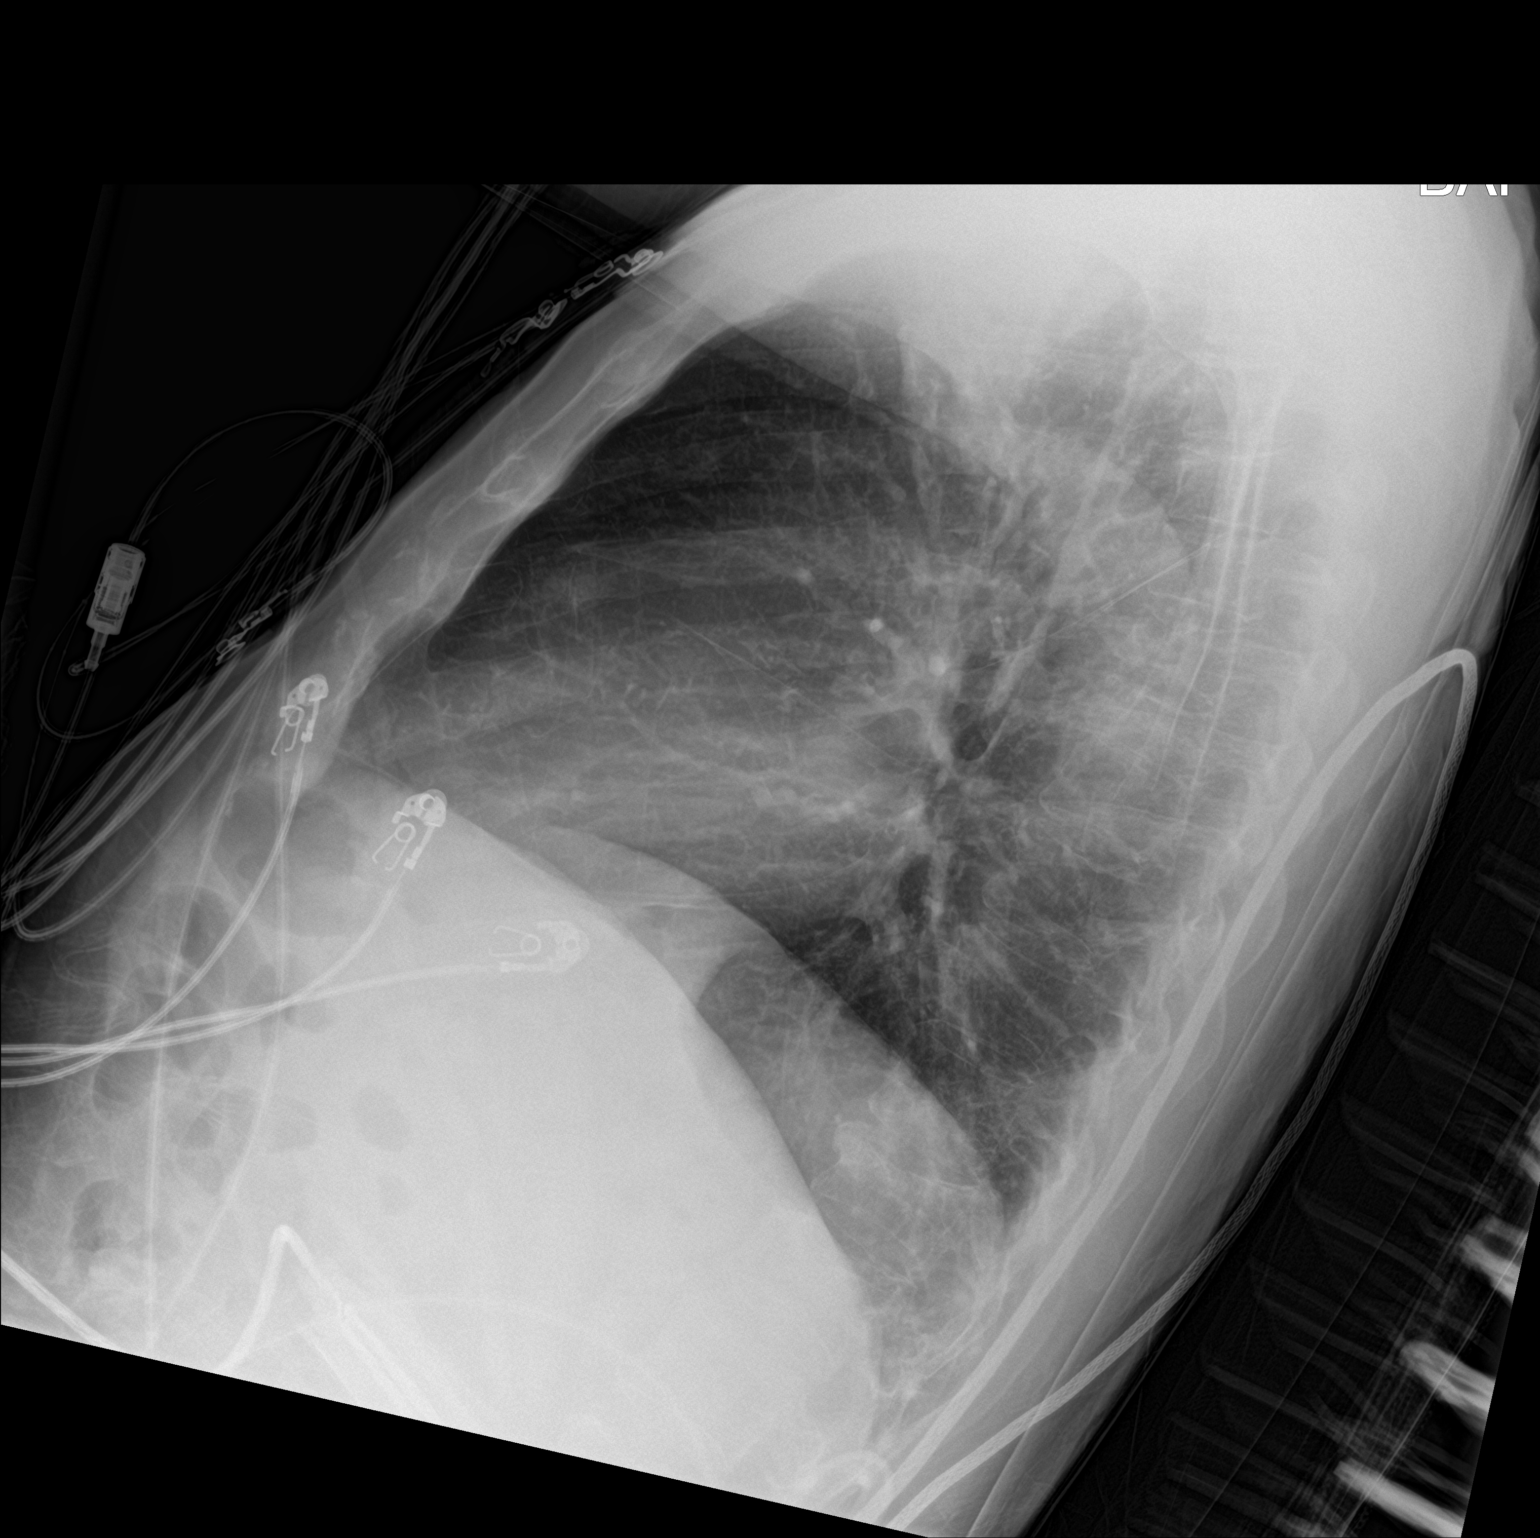

[chest ap]
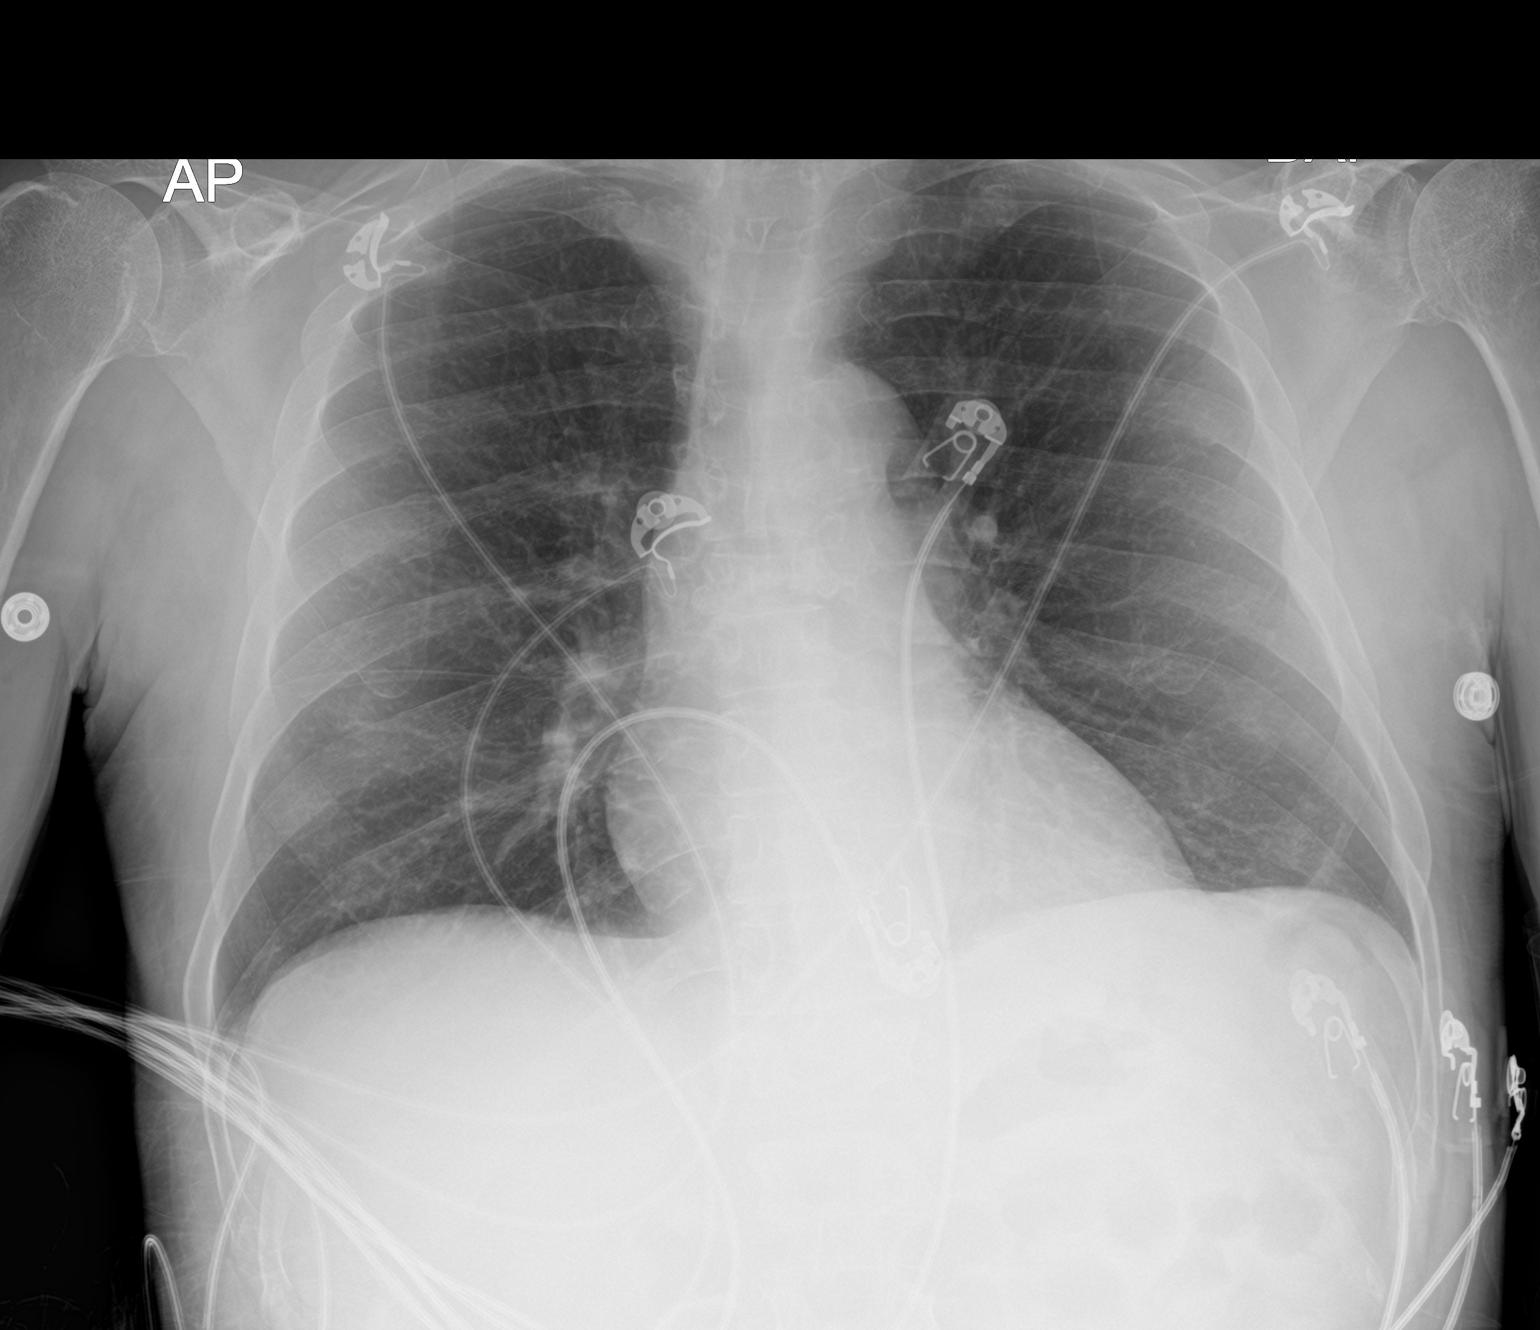

[2 of 2 positions shown; findings below may reference images not displayed]

FINDINGS: The heart size and mediastinal contours are within normal limits.
Both lungs are clear. The visualized skeletal structures are
unremarkable.
IMPRESSION: No active cardiopulmonary disease.

## 2020-04-04 IMAGING — CT CT HEAD WITHOUT CONTRAST
3 series · 15 of 47 positions shown, 18 images · non-contrast
Comparison: October 02, 2018

CLINICAL DATA: Pain following fall

EXAM:
CT HEAD WITHOUT CONTRAST
TECHNIQUE: Contiguous axial images were obtained from the base of the skull
through the vertex without intravenous contrast.

[Series 3: head 5.0 h30s · axial · 0.43mm/px · z∈[-111,+24]mm · 9 of 33 slices shown, 12 images]
[im 3/33  brain]
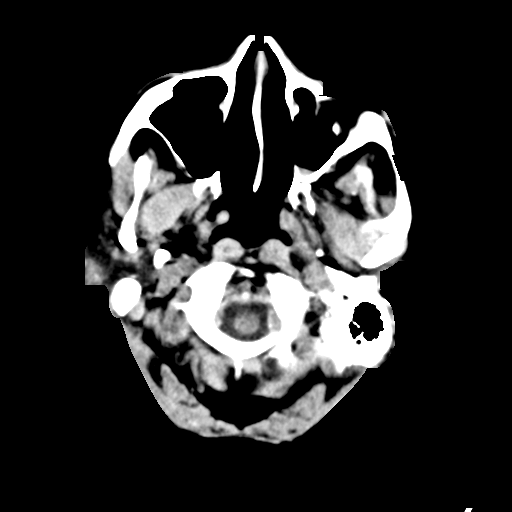
[im 3/33  bone]
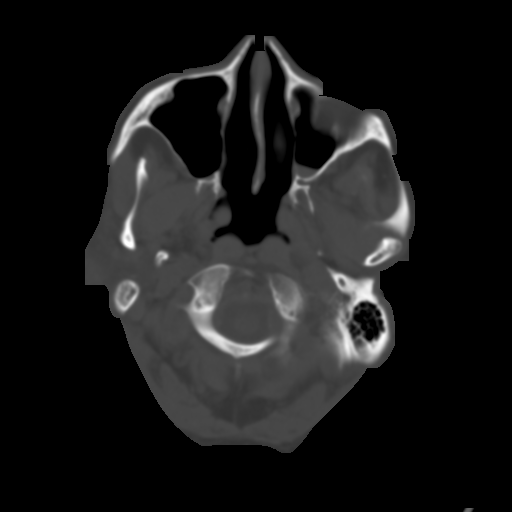
[im 6/33  brain]
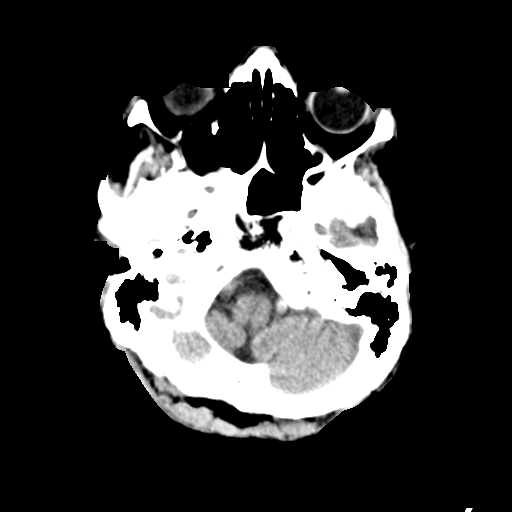
[im 9/33  brain]
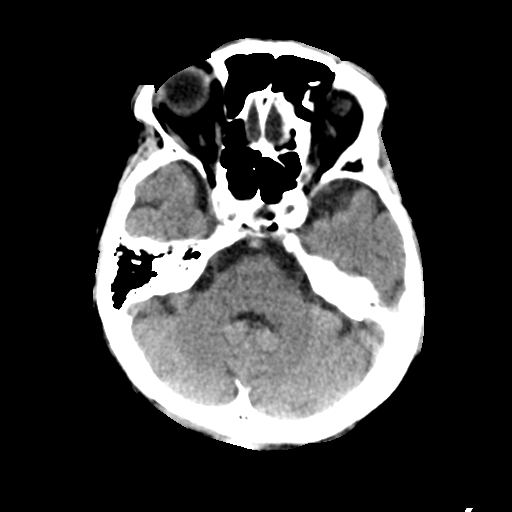
[im 13/33  brain]
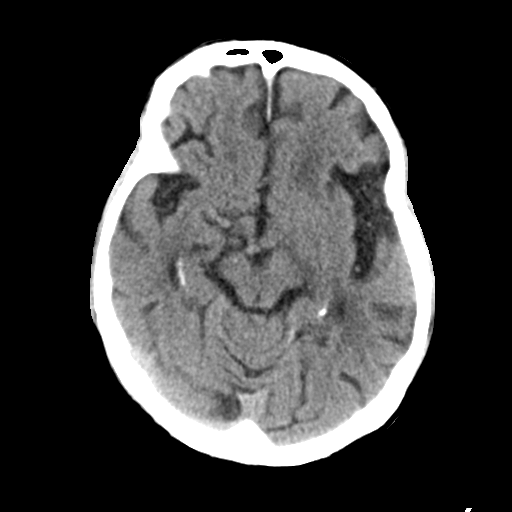
[im 17/33  brain]
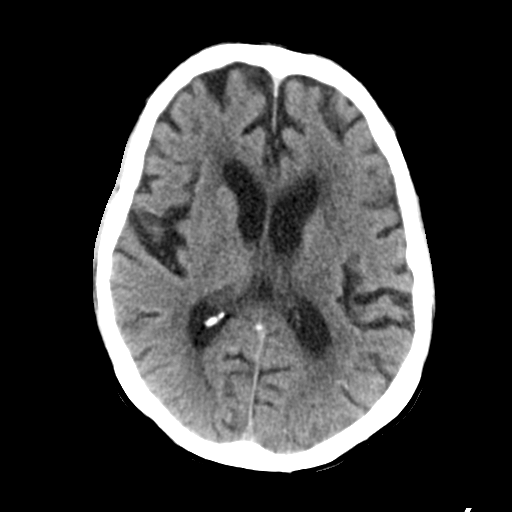
[im 17/33  bone]
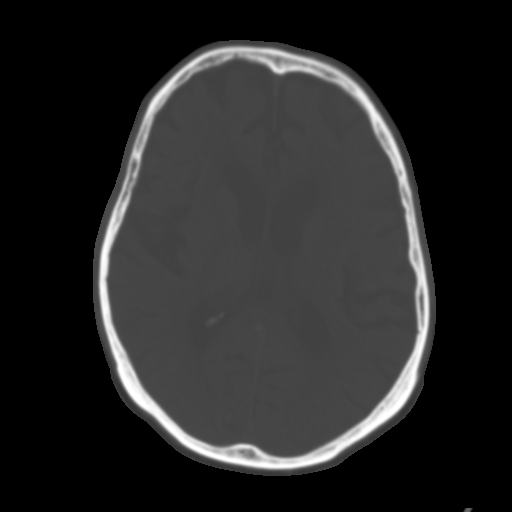
[im 20/33  brain]
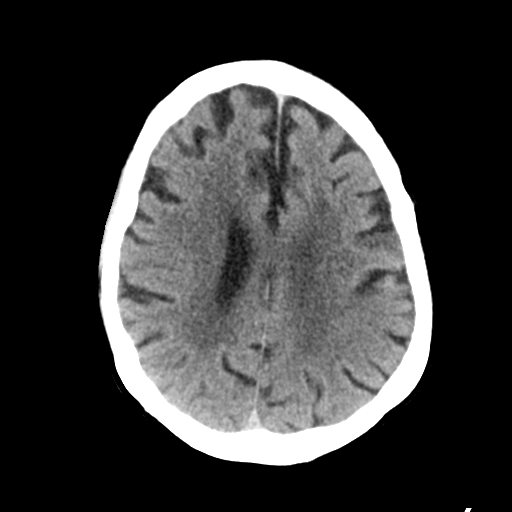
[im 24/33  brain]
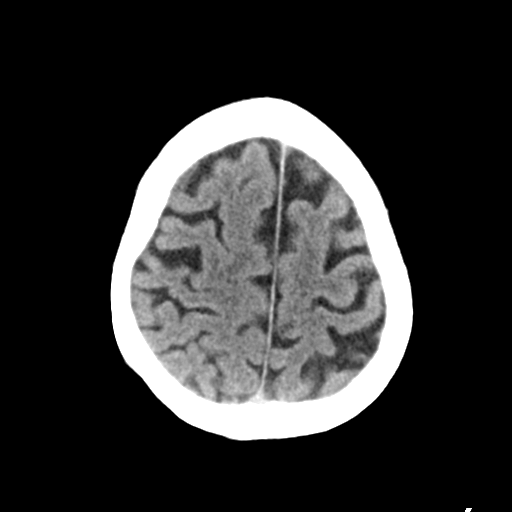
[im 27/33  brain]
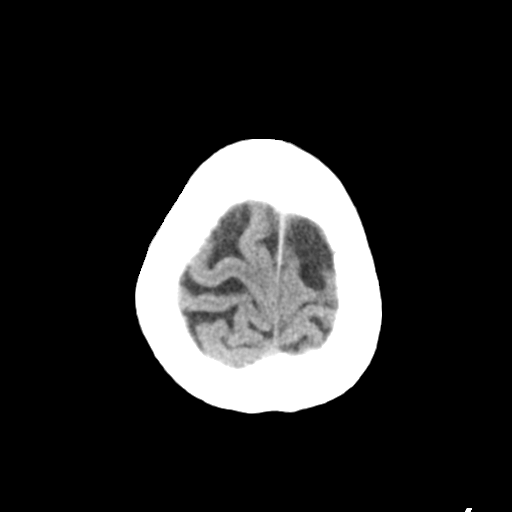
[im 30/33  brain]
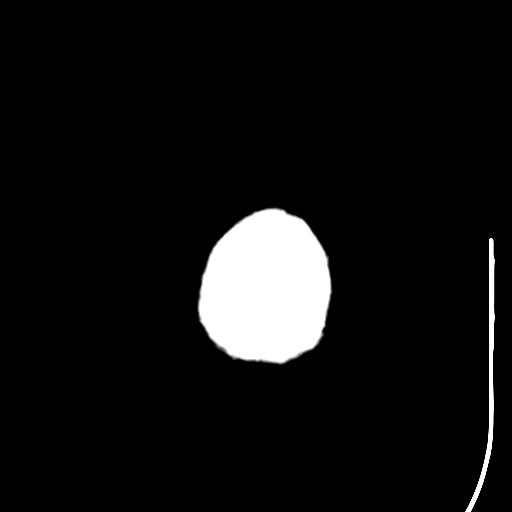
[im 30/33  bone]
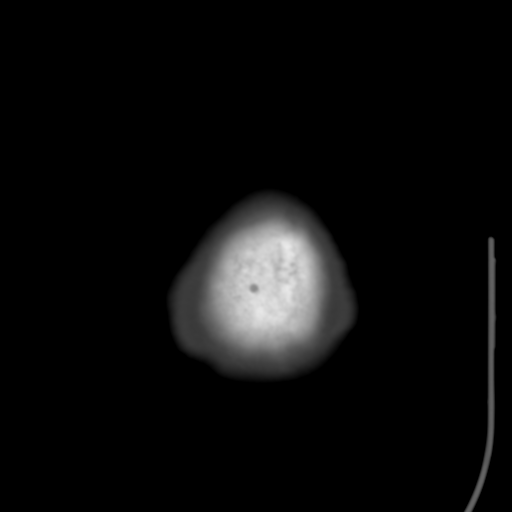

[Series 5: head 3.0 mpr cor · coronal · 0.31mm/px · 3 of 67 slices shown]
[im 23/67  brain]
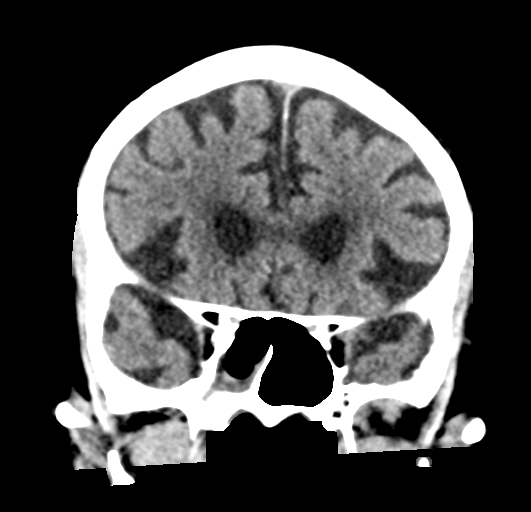
[im 30/67  brain]
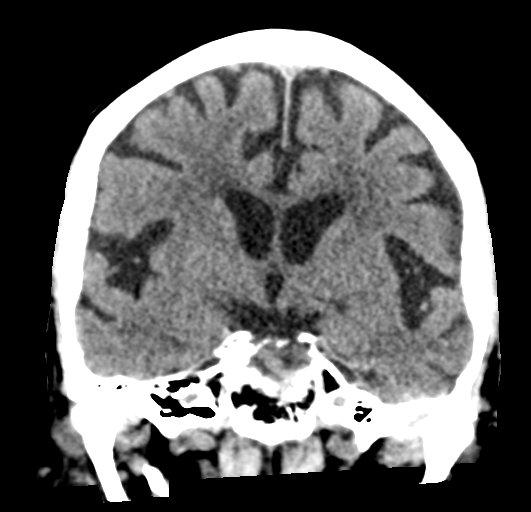
[im 37/67  brain]
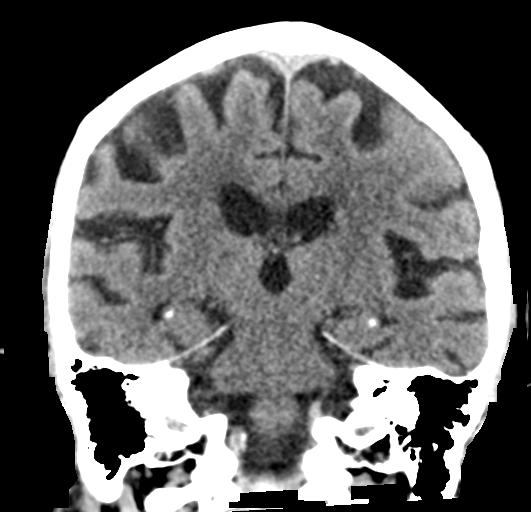

[Series 6: head 3.0 mpr sag · sagittal · 0.30mm/px · 3 of 56 slices shown]
[im 19/56  brain]
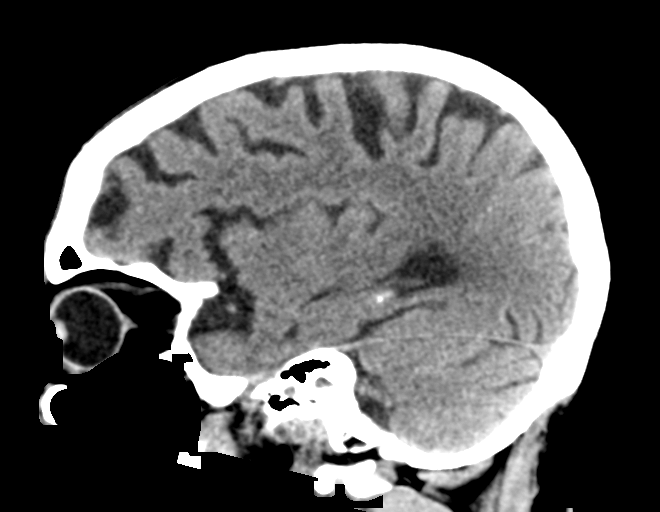
[im 28/56  brain]
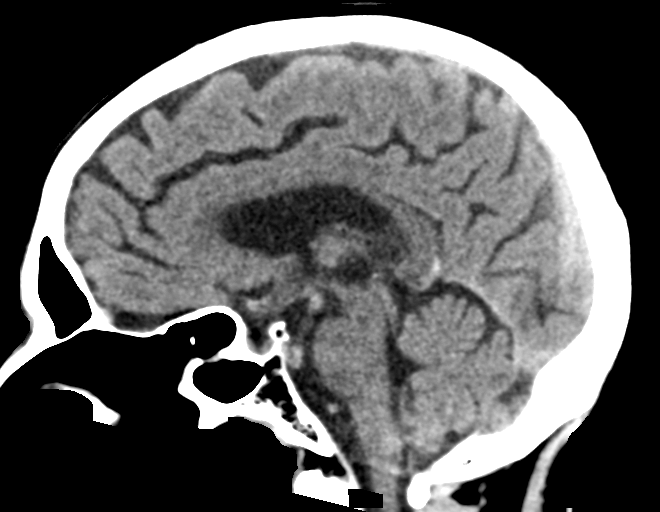
[im 37/56  brain]
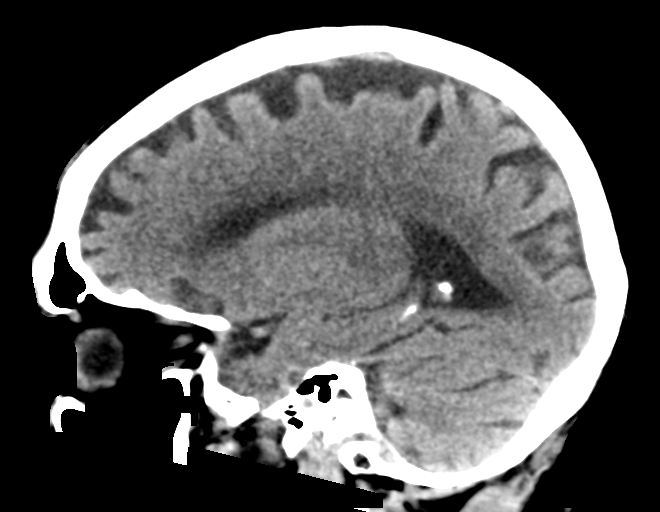

[15 of 47 positions shown; findings below may reference images not displayed]

FINDINGS: Brain: Mild to moderate diffuse atrophy is stable. There is no
intracranial mass, hemorrhage, extra-axial fluid collection, or
midline shift. There is small vessel disease in the centra semiovale
bilaterally, stable. No acute infarct evident.

Vascular: No hyperdense vessel. There is calcification in each
distal vertebral artery and carotid siphon regions bilaterally.

Skull: The bony calvarium appears intact.

Sinuses/Orbits: There is an air-fluid level in the left maxillary
antrum. There is opacification throughout much of the right sphenoid
sinus. Orbits appear symmetric bilaterally.

Other: Mastoid air cells are clear. There is debris in each external
auditory canal.
IMPRESSION: Atrophy with patchy periventricular small vessel disease. No acute
infarct. No mass or hemorrhage.

There are foci of arterial vascular calcification. Foci of paranasal
sinus disease noted. There is probable cerumen in each external
auditory canal.

## 2020-04-14 ENCOUNTER — Emergency Department (HOSPITAL_COMMUNITY): Payer: Medicare Other

## 2020-04-14 ENCOUNTER — Other Ambulatory Visit: Payer: Self-pay

## 2020-04-14 ENCOUNTER — Inpatient Hospital Stay (HOSPITAL_COMMUNITY)
Admission: EM | Admit: 2020-04-14 | Discharge: 2020-04-20 | DRG: 637 | Disposition: A | Discharge status: Skilled Nursing Facility | Payer: Medicare Other | Attending: Internal Medicine | Admitting: Internal Medicine

## 2020-04-14 ENCOUNTER — Inpatient Hospital Stay (HOSPITAL_COMMUNITY): Payer: Medicare Other

## 2020-04-14 ENCOUNTER — Encounter (HOSPITAL_COMMUNITY): Payer: Self-pay

## 2020-04-14 DIAGNOSIS — R531 Weakness: Secondary | ICD-10-CM | POA: Diagnosis present

## 2020-04-14 DIAGNOSIS — Z515 Encounter for palliative care: Secondary | ICD-10-CM | POA: Diagnosis not present

## 2020-04-14 DIAGNOSIS — Z20822 Contact with and (suspected) exposure to covid-19: Secondary | ICD-10-CM | POA: Diagnosis present

## 2020-04-14 DIAGNOSIS — R4781 Slurred speech: Secondary | ICD-10-CM | POA: Diagnosis present

## 2020-04-14 DIAGNOSIS — D519 Vitamin B12 deficiency anemia, unspecified: Secondary | ICD-10-CM | POA: Diagnosis present

## 2020-04-14 DIAGNOSIS — Z8673 Personal history of transient ischemic attack (TIA), and cerebral infarction without residual deficits: Secondary | ICD-10-CM | POA: Diagnosis not present

## 2020-04-14 DIAGNOSIS — N185 Chronic kidney disease, stage 5: Secondary | ICD-10-CM | POA: Diagnosis present

## 2020-04-14 DIAGNOSIS — E1121 Type 2 diabetes mellitus with diabetic nephropathy: Secondary | ICD-10-CM | POA: Diagnosis not present

## 2020-04-14 DIAGNOSIS — I130 Hypertensive heart and chronic kidney disease with heart failure and stage 1 through stage 4 chronic kidney disease, or unspecified chronic kidney disease: Secondary | ICD-10-CM | POA: Diagnosis present

## 2020-04-14 DIAGNOSIS — I959 Hypotension, unspecified: Secondary | ICD-10-CM | POA: Diagnosis present

## 2020-04-14 DIAGNOSIS — E875 Hyperkalemia: Secondary | ICD-10-CM | POA: Diagnosis present

## 2020-04-14 DIAGNOSIS — Z8249 Family history of ischemic heart disease and other diseases of the circulatory system: Secondary | ICD-10-CM | POA: Diagnosis not present

## 2020-04-14 DIAGNOSIS — E559 Vitamin D deficiency, unspecified: Secondary | ICD-10-CM | POA: Diagnosis present

## 2020-04-14 DIAGNOSIS — Z83438 Family history of other disorder of lipoprotein metabolism and other lipidemia: Secondary | ICD-10-CM

## 2020-04-14 DIAGNOSIS — I5032 Chronic diastolic (congestive) heart failure: Secondary | ICD-10-CM | POA: Diagnosis present

## 2020-04-14 DIAGNOSIS — G9341 Metabolic encephalopathy: Secondary | ICD-10-CM | POA: Diagnosis present

## 2020-04-14 DIAGNOSIS — IMO0002 Reserved for concepts with insufficient information to code with codable children: Secondary | ICD-10-CM

## 2020-04-14 DIAGNOSIS — I10 Essential (primary) hypertension: Secondary | ICD-10-CM | POA: Diagnosis not present

## 2020-04-14 DIAGNOSIS — E1169 Type 2 diabetes mellitus with other specified complication: Secondary | ICD-10-CM | POA: Diagnosis present

## 2020-04-14 DIAGNOSIS — Y92039 Unspecified place in apartment as the place of occurrence of the external cause: Secondary | ICD-10-CM | POA: Diagnosis not present

## 2020-04-14 DIAGNOSIS — I69318 Other symptoms and signs involving cognitive functions following cerebral infarction: Secondary | ICD-10-CM

## 2020-04-14 DIAGNOSIS — E860 Dehydration: Secondary | ICD-10-CM | POA: Diagnosis present

## 2020-04-14 DIAGNOSIS — Z7189 Other specified counseling: Secondary | ICD-10-CM | POA: Diagnosis not present

## 2020-04-14 DIAGNOSIS — Z820 Family history of epilepsy and other diseases of the nervous system: Secondary | ICD-10-CM

## 2020-04-14 DIAGNOSIS — E1165 Type 2 diabetes mellitus with hyperglycemia: Secondary | ICD-10-CM

## 2020-04-14 DIAGNOSIS — E1122 Type 2 diabetes mellitus with diabetic chronic kidney disease: Secondary | ICD-10-CM | POA: Diagnosis present

## 2020-04-14 DIAGNOSIS — N184 Chronic kidney disease, stage 4 (severe): Secondary | ICD-10-CM | POA: Diagnosis present

## 2020-04-14 DIAGNOSIS — E1110 Type 2 diabetes mellitus with ketoacidosis without coma: Secondary | ICD-10-CM | POA: Diagnosis present

## 2020-04-14 DIAGNOSIS — M79661 Pain in right lower leg: Secondary | ICD-10-CM | POA: Diagnosis present

## 2020-04-14 DIAGNOSIS — E785 Hyperlipidemia, unspecified: Secondary | ICD-10-CM | POA: Diagnosis present

## 2020-04-14 DIAGNOSIS — Z79899 Other long term (current) drug therapy: Secondary | ICD-10-CM

## 2020-04-14 DIAGNOSIS — Z87891 Personal history of nicotine dependence: Secondary | ICD-10-CM

## 2020-04-14 DIAGNOSIS — F1010 Alcohol abuse, uncomplicated: Secondary | ICD-10-CM | POA: Diagnosis present

## 2020-04-14 DIAGNOSIS — E1159 Type 2 diabetes mellitus with other circulatory complications: Secondary | ICD-10-CM | POA: Diagnosis present

## 2020-04-14 DIAGNOSIS — E538 Deficiency of other specified B group vitamins: Secondary | ICD-10-CM | POA: Diagnosis not present

## 2020-04-14 DIAGNOSIS — E11649 Type 2 diabetes mellitus with hypoglycemia without coma: Secondary | ICD-10-CM | POA: Diagnosis present

## 2020-04-14 DIAGNOSIS — E1151 Type 2 diabetes mellitus with diabetic peripheral angiopathy without gangrene: Secondary | ICD-10-CM | POA: Diagnosis present

## 2020-04-14 DIAGNOSIS — I152 Hypertension secondary to endocrine disorders: Secondary | ICD-10-CM | POA: Diagnosis present

## 2020-04-14 DIAGNOSIS — R52 Pain, unspecified: Secondary | ICD-10-CM

## 2020-04-14 DIAGNOSIS — N179 Acute kidney failure, unspecified: Secondary | ICD-10-CM | POA: Diagnosis present

## 2020-04-14 DIAGNOSIS — D72829 Elevated white blood cell count, unspecified: Secondary | ICD-10-CM | POA: Diagnosis present

## 2020-04-14 DIAGNOSIS — W19XXXA Unspecified fall, initial encounter: Secondary | ICD-10-CM | POA: Diagnosis present

## 2020-04-14 DIAGNOSIS — I739 Peripheral vascular disease, unspecified: Secondary | ICD-10-CM | POA: Diagnosis not present

## 2020-04-14 DIAGNOSIS — Z794 Long term (current) use of insulin: Secondary | ICD-10-CM

## 2020-04-14 DIAGNOSIS — Z86718 Personal history of other venous thrombosis and embolism: Secondary | ICD-10-CM

## 2020-04-14 DIAGNOSIS — Z96641 Presence of right artificial hip joint: Secondary | ICD-10-CM | POA: Diagnosis present

## 2020-04-14 DIAGNOSIS — Z79890 Hormone replacement therapy (postmenopausal): Secondary | ICD-10-CM

## 2020-04-14 LAB — BASIC METABOLIC PANEL
Anion gap: 29 — ABNORMAL HIGH (ref 5–15)
Anion gap: 17 — ABNORMAL HIGH (ref 5–15)
BUN: 69 mg/dL — ABNORMAL HIGH (ref 8–23)
BUN: 64 mg/dL — ABNORMAL HIGH (ref 8–23)
CO2: 7 mmol/L — ABNORMAL LOW (ref 22–32)
CO2: 15 mmol/L — ABNORMAL LOW (ref 22–32)
Calcium: 8.2 mg/dL — ABNORMAL LOW (ref 8.9–10.3)
Calcium: 8.6 mg/dL — ABNORMAL LOW (ref 8.9–10.3)
Chloride: 93 mmol/L — ABNORMAL LOW (ref 98–111)
Chloride: 104 mmol/L (ref 98–111)
Creatinine, Ser: 5.23 mg/dL — ABNORMAL HIGH (ref 0.61–1.24)
Creatinine, Ser: 4.86 mg/dL — ABNORMAL HIGH (ref 0.61–1.24)
GFR calc Af Amer: 11 mL/min — ABNORMAL LOW (ref >60)
GFR calc Af Amer: 13 mL/min — ABNORMAL LOW (ref >60)
GFR calc non Af Amer: 10 mL/min — ABNORMAL LOW (ref >60)
GFR calc non Af Amer: 11 mL/min — ABNORMAL LOW (ref >60)
Glucose, Bld: 1197 mg/dL (ref 70–99)
Glucose, Bld: 426 mg/dL — ABNORMAL HIGH (ref 70–99)
Potassium: 7.4 mmol/L (ref 3.5–5.1)
Potassium: 4.7 mmol/L (ref 3.5–5.1)
Sodium: 129 mmol/L — ABNORMAL LOW (ref 135–145)
Sodium: 136 mmol/L (ref 135–145)

## 2020-04-14 LAB — CBC WITH DIFFERENTIAL/PLATELET
Abs Immature Granulocytes: 0.26 10*3/uL — ABNORMAL HIGH (ref 0.00–0.07)
Basophils Absolute: 0.1 10*3/uL (ref 0.0–0.1)
Basophils Relative: 0 %
Eosinophils Absolute: 0.1 10*3/uL (ref 0.0–0.5)
Eosinophils Relative: 1 %
HCT: 30.7 % — ABNORMAL LOW (ref 39.0–52.0)
Hemoglobin: 8 g/dL — ABNORMAL LOW (ref 13.0–17.0)
Immature Granulocytes: 2 %
Lymphocytes Relative: 11 %
Lymphs Abs: 1.3 10*3/uL (ref 0.7–4.0)
MCH: 29.7 pg (ref 26.0–34.0)
MCHC: 26.1 g/dL — ABNORMAL LOW (ref 30.0–36.0)
MCV: 114.1 fL — ABNORMAL HIGH (ref 80.0–100.0)
Monocytes Absolute: 0.4 10*3/uL (ref 0.1–1.0)
Monocytes Relative: 4 %
Neutro Abs: 9.4 10*3/uL — ABNORMAL HIGH (ref 1.7–7.7)
Neutrophils Relative %: 82 %
Platelets: 284 10*3/uL (ref 150–400)
RBC: 2.69 MIL/uL — ABNORMAL LOW (ref 4.22–5.81)
RDW: 13.9 % (ref 11.5–15.5)
WBC: 11.5 10*3/uL — ABNORMAL HIGH (ref 4.0–10.5)
nRBC: 0 % (ref 0.0–0.2)

## 2020-04-14 LAB — GLUCOSE, CAPILLARY
Glucose-Capillary: 270 mg/dL — ABNORMAL HIGH (ref 70–99)
Glucose-Capillary: 350 mg/dL — ABNORMAL HIGH (ref 70–99)
Glucose-Capillary: 408 mg/dL — ABNORMAL HIGH (ref 70–99)

## 2020-04-14 LAB — CK: Total CK: 225 U/L (ref 49–397)

## 2020-04-14 LAB — CBG MONITORING, ED
Glucose-Capillary: >600 mg/dL (ref 70–99)
Glucose-Capillary: >600 mg/dL (ref 70–99)
Glucose-Capillary: >600 mg/dL (ref 70–99)
Glucose-Capillary: >600 mg/dL (ref 70–99)
Glucose-Capillary: >600 mg/dL (ref 70–99)
Glucose-Capillary: 462 mg/dL — ABNORMAL HIGH (ref 70–99)

## 2020-04-14 LAB — BETA-HYDROXYBUTYRIC ACID
Beta-Hydroxybutyric Acid: 7.12 mmol/L — ABNORMAL HIGH (ref 0.05–0.27)
Beta-Hydroxybutyric Acid: 0.26 mmol/L (ref 0.05–0.27)

## 2020-04-14 LAB — SARS CORONAVIRUS 2 BY RT PCR (HOSPITAL ORDER, PERFORMED IN ~~LOC~~ HOSPITAL LAB): SARS Coronavirus 2: NEGATIVE

## 2020-04-14 MED ORDER — DEXTROSE-NACL 5-0.45 % IV SOLN: INTRAVENOUS | Status: DC

## 2020-04-14 MED ORDER — INSULIN REGULAR(HUMAN) IN NACL 100-0.9 UT/100ML-% IV SOLN
INTRAVENOUS | Status: DC
  Administered 2020-04-14: 8.5 [IU]/h via INTRAVENOUS
  Filled 2020-04-14: qty 100

## 2020-04-14 MED ORDER — SODIUM BICARBONATE 8.4 % IV SOLN
50.0000 meq | Freq: Once | INTRAVENOUS | Status: AC
  Administered 2020-04-14: 50 meq via INTRAVENOUS
  Filled 2020-04-14: qty 50

## 2020-04-14 MED ORDER — DEXTROSE 50 % IV SOLN: 0.0000 mL | INTRAVENOUS | Status: DC | PRN

## 2020-04-14 MED ORDER — SODIUM CHLORIDE 0.9 % IV BOLUS
1000.0000 mL | Freq: Once | INTRAVENOUS | Status: AC
  Administered 2020-04-14: 1000 mL via INTRAVENOUS

## 2020-04-14 MED ORDER — SODIUM CHLORIDE 0.9 % IV SOLN: INTRAVENOUS | Status: DC

## 2020-04-14 MED ORDER — CALCIUM GLUCONATE 10 % IV SOLN: 1.0000 g | Freq: Once | INTRAVENOUS | Status: DC

## 2020-04-14 MED ORDER — INSULIN REGULAR(HUMAN) IN NACL 100-0.9 UT/100ML-% IV SOLN
INTRAVENOUS | Status: DC
  Filled 2020-04-14: qty 100

## 2020-04-14 MED ORDER — ENOXAPARIN SODIUM 30 MG/0.3ML ~~LOC~~ SOLN
30.0000 mg | SUBCUTANEOUS | Status: DC
  Administered 2020-04-14 – 2020-04-19 (×6): 30 mg via SUBCUTANEOUS
  Filled 2020-04-14 (×6): qty 0.3

## 2020-04-14 MED ORDER — CALCIUM GLUCONATE-NACL 1-0.675 GM/50ML-% IV SOLN
1.0000 g | Freq: Once | INTRAVENOUS | Status: AC
  Administered 2020-04-14: 1000 mg via INTRAVENOUS
  Filled 2020-04-14: qty 50

## 2020-04-14 MED ORDER — HYDROMORPHONE HCL 1 MG/ML IJ SOLN: 0.5000 mg | INTRAMUSCULAR | Status: DC | PRN

## 2020-04-14 MED ORDER — CHLORHEXIDINE GLUCONATE CLOTH 2 % EX PADS
6.0000 | MEDICATED_PAD | Freq: Every day | CUTANEOUS | Status: DC
  Administered 2020-04-16 – 2020-04-20 (×6): 6 via TOPICAL

## 2020-04-14 MED ORDER — LACTATED RINGERS IV BOLUS
20.0000 mL/kg | Freq: Once | INTRAVENOUS | Status: AC
  Administered 2020-04-14: 1374 mL via INTRAVENOUS

## 2020-04-14 MED ORDER — POLYETHYLENE GLYCOL 3350 17 G PO PACK: 17.0000 g | PACK | Freq: Every day | ORAL | Status: DC | PRN

## 2020-04-14 MED ORDER — METOPROLOL TARTRATE 5 MG/5ML IV SOLN
5.0000 mg | Freq: Four times a day (QID) | INTRAVENOUS | Status: DC | PRN
  Administered 2020-04-14 – 2020-04-16 (×2): 5 mg via INTRAVENOUS
  Filled 2020-04-14 (×2): qty 5

## 2020-04-14 NOTE — ED Notes (Signed)
Patient calling out for help, this RN entered room, patient stating he needs to use the bathroom. Patient currently wearing external urinary catheter and brief. Patient informed he could use the bathroom whenever he needs to. Patient upset, given verbal reassurance, asked to take some deep breaths. Patient currently calm and resting. Denies pain at this time.

## 2020-04-14 NOTE — H&P (Signed)
History and Physical    [name] [name] BDZ:[id number] DOB: Oct 10, 1946 DOA: 04/14/2020  PCP: Libby Maw, MD  Patient coming from: [name]  I have personally briefly reviewed patient's old medical records in [place]  Chief Complaint: Altered mental status and fall  HPI: [name] [name] is a 74 y.o. male with medical history significant of type 2 diabetes, history of CVA with mild cognitive impairment, hypertension, chronic kidney disease stage IV who was recently admitted from 6/12-6/17 with acute encephalopathy related to hyperosmolar hyperglycemia.  At that time his Lantus was reduced to 6 units/day with sliding scale for meal coverage due to ongoing hypoglycemic events.  At that admission he was discharged to a skilled nursing facility due to a complicated social situation.  He has ongoing cognitive impairment and is being cared for by 2 sisters who are also responsible for caring for his 12 year old mother and a brother with Down syndrome.  Apparently he had fallen at the nursing home and was just not acting like himself.  According to the nursing home he had hyperglycemia for the past 2 days.  History was obtained by reading the chart in the ED note as the patient is unable to give correct history at this time  ED Course: In the emergency department he was found to be confused and hyperventilating.  Labs revealed anion gap acidemia consistent with DKA.  He was also noted to be hyperglycemic with a blood sugar in the 1197 range.  His bicarb was 7 the anion gap was 29.  At that time his potassium was 7.4.  He was noted to be anemic with a hemoglobin of 8.0 in the setting of known vitamin B12 deficiency.  We are asked to admit for correction of his ongoing DKA.  Review of Systems: As per HPI otherwise 10 point review of systems negative.   Past Medical History:  Diagnosis Date  . 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  Closed comminuted intertrochanteric fracture of left femur ([place])   . Diabetes mellitus without complication ([place])   . Diabetic hyperosmolar non-ketotic state ([place]) 05/15/2016  . DKA (diabetic ketoacidoses) ([employer]) 05/09/2016  . Hypertension   . Hypoglycemia 11/29/2019  . Postoperative anemia due to acute blood loss 06/18/2016  . Retroperitoneal hematoma 06/18/2016  . Stroke ([place])   . Vitamin B 12 deficiency 06/18/2016    Past Surgical History:  Procedure Laterality Date  . HIP ARTHROPLASTY Right 02/05/2013   Procedure: ARTHROPLASTY BIPOLAR HIP;  Surgeon: [name], MD;  Location: [facility];  Service: Orthopedics;  Laterality: Right;  . INTRAMEDULLARY (IM) NAIL INTERTROCHANTERIC Left 06/16/2016   Procedure: INTRAMEDULLARY (IM) NAIL INTERTROCHANTRIC;  Surgeon: Rod Can, MD;  Location: [facility];  Service: Orthopedics;  Laterality: Left;     reports that he quit smoking about 4 years ago. His smoking use included cigarettes. He has a 7.50 pack-year smoking history. He has never used smokeless tobacco. He reports that he does not drink alcohol and does not use drugs.  No Known Allergies  Family History  Problem Relation Age of Onset  . Alzheimer's disease Mother   . Hypertension Mother   . Hyperlipidemia Father   . Hypertension Father   . Healthy Maternal Grandmother   . Pneumonia Maternal Grandfather     Prior to Admission medications   Medication Sig Start Date End Date Taking? Authorizing Provider  ACCU-CHEK GUIDE test strip USE AS DIRECTED 12/19/19   Kathe Becton  M, [name]  amLODipine (NORVASC) 10 MG tablet TAKE 1 TABLET ONCE DAILY. Patient taking differently: Take 10 mg by mouth daily.  09/28/19   Azzie Glatter, [name]  atorvastatin (LIPITOR) 40 MG tablet Take 1 tablet (40 mg total) by mouth daily. 03/29/20   Lurline Del, DO  Blood Glucose Monitoring Suppl (ACCU-CHEK GUIDE) w/Device KIT 1 Device by Does not apply route QID. Patient not taking: Reported on 01/03/2020 11/02/19   Azzie Glatter, [name]  cholecalciferol (VITAMIN D3) 25 MCG (1000 UNIT) tablet Take 1,000 Units by mouth daily.    [provider]  furosemide (LASIX) 20 MG tablet Take 1 tablet (20 mg total) by mouth daily. 12/05/19   [name], MD  glucose blood (ACCU-CHEK GUIDE) test strip Use as instructed 11/03/18   Azzie Glatter, [name]  glucose blood (TRUE METRIX BLOOD GLUCOSE TEST) test strip CHECK BLOOD SUGAR UP TO 4 TIMES A DAY. Patient taking differently: CHECK BLOOD SUGAR UP TO 2 TIMES A DAY. 05/25/18   Azzie Glatter, [name]  hydrALAZINE (APRESOLINE) 25 MG tablet Take 3 tablets (75 mg total) by mouth every 8 (eight) hours. 03/29/20   Welborn, Ryan, DO  insulin glargine (LANTUS) 100 UNIT/ML injection Inject 0.06 mLs (6 Units total) into the skin at bedtime. 03/29/20   Lurline Del, DO  insulin lispro (HUMALOG KWIKPEN) 100 UNIT/ML KwikPen Max daily 24 units Per correctional scale Patient taking differently: Inject 1-7 Units into the skin 3 (three) times daily. Max daily 24 units Per correctional scale 01/03/20   [name], [name], MD  Insulin Pen Needle 32G X 4 MM MISC 1 Device by Does not apply route in the morning, at noon, in the evening, and at bedtime. 01/03/20   [name], [name], MD  Insulin Syringe-Needle U-100 (BD INSULIN SYRINGE U/F) 31G X 5/16" 0.3 ML MISC Inject 1 each as directed 2 (two) times daily. as directed 11/02/19   Azzie Glatter, [name]  levothyroxine (SYNTHROID) 25 MCG tablet Take 25 mcg by mouth daily. 04/13/20   [provider]  vitamin B-12 1000 MCG tablet Take 1 tablet (1,000 mcg total) by mouth daily. 12/05/19   [name], MD    Physical Exam: Vitals:   04/14/20 1547 04/14/20 1602 04/14/20 1617 04/14/20 1632  BP: (!) 103/54 101/72 108/70 99/79  Pulse: 98 96 96 99  Resp: (!) 25 (!) 26 (!) 28 (!) 28  Temp: 97.6 F (36.4 C)     TempSrc: Oral     SpO2: 100% 100% 100% 100%  Weight:        Constitutional: NAD, lying in bed, arouses and  speaks with slurred words Eyes: lids and conjunctivae normal ENMT: Mucous membranes are moist. Posterior pharynx clear of any exudate or lesions. Edentulous Neck: normal, supple, no masses, no thyromegaly Respiratory: Kussmaul's respirations, clear to auscultation bilaterally, no wheezing, no crackles. Normal respiratory effort. No accessory muscle use.  Cardiovascular: Distant heart sounds regular rate and rhythm, no murmurs / rubs / gallops. No extremity edema. 2+ pedal pulses. No carotid bruits.  Abdomen: no tenderness, no masses palpated. No hepatosplenomegaly. Bowel sounds positive.  Musculoskeletal: no clubbing / cyanosis. No joint deformity upper and lower extremities. Good ROM, no contractures. Normal muscle tone.  Skin: no rashes, lesions, ulcers. No induration Neurologic: Arouses, verbalizes, but does not really answer questions. Psychiatric: Normal judgment and insight. Alert and oriented x 3. Normal mood.   Labs on Admission: I have personally reviewed following labs and imaging studies  CBC: Recent Labs  Lab 04/14/20 1315  WBC 11.5*  NEUTROABS 9.4*  HGB 8.0*  HCT 30.7*  MCV 114.1*  PLT 292   Basic Metabolic Panel: Recent Labs  Lab 04/14/20 1315  NA 129*  K 7.4*  CL 93*  CO2 7*  GLUCOSE 1,197*  BUN 69*  CREATININE 5.23*  CALCIUM 8.2*   CBG: Recent Labs  Lab 04/14/20 1325 04/14/20 1359 04/14/20 1435 04/14/20 1550  GLUCAP >600* >600* >600* >600*   Urine analysis:    Component Value Date/Time   COLORURINE YELLOW 03/24/2020 1542   APPEARANCEUR CLEAR 03/24/2020 1542   LABSPEC 1.017 03/24/2020 1542   PHURINE 5.0 03/24/2020 1542   GLUCOSEU >=500 (A) 03/24/2020 1542   GLUCOSEU >=1000 (A) 12/08/2019 1139   HGBUR SMALL (A) 03/24/2020 1542   BILIRUBINUR NEGATIVE 03/24/2020 1542   BILIRUBINUR Negative 11/09/2019 0910   KETONESUR 20 (A) 03/24/2020 1542   PROTEINUR >=300 (A) 03/24/2020 1542   UROBILINOGEN 0.2 12/08/2019 1139   NITRITE NEGATIVE 03/24/2020  1542   LEUKOCYTESUR NEGATIVE 03/24/2020 1542    Radiological Exams on Admission: DG Chest Port 1 View  Result Date: 04/14/2020 CLINICAL DATA:  Hyperglycemia EXAM: PORTABLE CHEST 1 VIEW COMPARISON:  March 24, 2020 FINDINGS: The mediastinal contour and cardiac silhouette are stable. There is no focal infiltrate, pulmonary edema, or pleural effusion. The visualized bony structures are unremarkable. IMPRESSION: No active disease. Electronically Signed   By: Abelardo Diesel M.D.   On: 04/14/2020 14:31    EKG: Independently reviewed.  Sinus rhythm, incomplete right bundle branch block, left anterior fascicular block  Assessment/Plan Principal Problem:   DKA (diabetic ketoacidoses) ([name]) Active Problems:   Uncontrolled type 2 diabetes mellitus with diabetic nephropathy, with long-term current use of insulin ([name])   Vitamin B 12 deficiency   History of DVT (deep vein thrombosis)   Acute kidney injury superimposed on chronic kidney disease ([name])   Weakness   Acute metabolic encephalopathy   Hyperkalemia   Type 2 diabetes mellitus with hyperlipidemia ([place])   Hypertension   History of cerebrovascular accident (CVA) in adulthood   DKA, type 2 ([place])   Anemia   Uncontrolled type 2 diabetes mellitus with hyperglycemia, with long-term current use of insulin ([name])   PVD (peripheral vascular disease) ([place])  DKA Suspect falls and altered mental status is all related to DKA. IV fluid bolus complete Given calcium gluconate Endo tool with insulin GTT N.p.o. for now Serial labs with correction of electrolyte abnormalities as needed  Poorly controlled diabetes with diabetic nephropathy and long-term use of insulin On Endo tool for now and given this is a second admission for similar problems will need to titrate his insulin very carefully to ensure he does not represent for another admission  Vitamin D deficiency Holding his vitamin D for now will need resumption at discharge  History of DVT No  evidence of DVT at present, on Lovenox for DVT prophylaxis  Acute kidney injury on chronic kidney disease Likely related to dehydration and DKA, baseline creatinine is in the 2-3 range was 3.2 at last discharge. Avoid nephrotoxic agents  Weakness Likely related to DKA, will monitor  Acute metabolic encephalopathy Likely related to DKA, will monitor  Hyperkalemia Suspect this will correct with insulin IV fluid hydration, will need to monitor electrolytes closely and replete as necessary  Hyperlipidemia Lipitor is on hold  Hypertension Hypotensive at time of admission, holding Norvasc 10  History of CVA Ongoing cognitive delays, stable  Anemia Hemoglobin is 8 will  transfuse if less than 7.  Last B12 was in February, will recheck this  Peripheral vascular disease   DVT prophylaxis: Lovenox SQ Code Status: Full code confirmed at last admission with family medicine and palliative care Family Communication: ER communicated with his sister Disposition Plan: SNF Consults called: None Admission status: Inpatient   Donnamae Jude MD [facility]  If 7PM-7AM, please contact night-coverage 04/14/2020, 4:44 PM

## 2020-04-14 NOTE — ED Notes (Signed)
Rounded on patient, patient calm and sleeping. Insulin infusion currently running. Vital signs stable. Will continue to monitor.

## 2020-04-14 NOTE — ED Triage Notes (Signed)
EMS reports from independent living, sister called out for hyperglycemia since yesterday, found on floor possible fall, no obvious injury noted other than abrasion to right elbow. Prior stay at [facility] for same. Daughter stated Pt verbally baseline. Pt Hx of dementia.  BP 140/60 HR 94 RR 36 Sp02 100 RA CBG "HIGH"  22 L hand  440ml NS enroute

## 2020-04-14 NOTE — ED Provider Notes (Signed)
[facility] DEPT Provider Note   CSN: [id number] Arrival date & time: 04/14/20  1258     History Chief Complaint  Patient presents with  . Hyperglycemia    [name] [name] is a 74 y.o. male.  HPI     74 year old male comes in a chief complaint of elevated blood sugar. Level 5 caveat for altered mental status.  Patient has history of stroke, poorly controlled diabetes.  He was admitted to the hospital in June for DKA and discharged to a nursing facility.  According to patient's niece, patient was discharged to an independent living facility by the nursing home yesterday as his Medicare time ran out.  Today he was found down by one of the residents and they called for help.  Family thinks that patient is more sluggish than usual today.  His speech is more slurred.  Patient also had elevated blood sugar yesterday and today.  She is not sure if he can live independently and take care of himself.  Patient denies any pain, fevers.  Past Medical History:  Diagnosis Date  . 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 Closed comminuted intertrochanteric fracture of left femur ([place])   . Diabetes mellitus without complication ([place])   . Diabetic hyperosmolar non-ketotic state ([place]) 05/15/2016  . DKA (diabetic ketoacidoses) ([place]) 05/09/2016  . Hypertension   . Hypoglycemia 11/29/2019  . Postoperative anemia due to acute blood loss 06/18/2016  . Retroperitoneal hematoma 06/18/2016  . Stroke ([place])   . Vitamin B 12 deficiency 06/18/2016    Patient Active Problem List   Diagnosis Date Noted  . DKA (diabetic ketoacidoses) ([place]) 04/14/2020  . PVD (peripheral vascular disease) ([place]) 02/02/2020  . Poor social situation 12/08/2019  . CKD (chronic kidney disease) stage 4, GFR 15-29 ml/min ([name]) 12/08/2019  . Cognitive decline 11/22/2019  . ETOH abuse 05/17/2019  . Chronic diastolic CHF (congestive heart failure) ([place])  05/17/2019  . Uncontrolled type 2 diabetes mellitus with hyperglycemia, with long-term current use of insulin ([facility]) 10/03/2018  . Underweight 11/18/2017  . Anemia 11/18/2017  . DKA, type 2 ([place]) 08/01/2017  . History of cerebrovascular accident (CVA) in adulthood 07/25/2017  . Depression with anxiety 07/25/2017  . Left ventricular diastolic dysfunction 40/98/1191  . Dementia ([place]) 07/25/2017  . Brittle diabetes mellitus ([place]) 06/28/2017  . Type 2 diabetes mellitus with hyperlipidemia ([place]) 06/27/2017  . Hypertension 06/27/2017  . Hyperkalemia 01/16/2017  . Acute metabolic encephalopathy 47/82/9562  . Weakness 11/16/2016  . Acute kidney injury superimposed on chronic kidney disease ([place])   . Noncompliance with medication regimen 08/17/2016  . History of DVT (deep vein thrombosis) 07/06/2016  . Vitamin B 12 deficiency 06/18/2016  . Hyponatremia 05/15/2016  . Anxiety 05/15/2016  . Narcotic dependency, continuous ([place]) 11/02/2015  . Uncontrolled type 2 diabetes mellitus with diabetic nephropathy, with long-term current use of insulin ([place]) 08/12/2015    Past Surgical History:  Procedure Laterality Date  . HIP ARTHROPLASTY Right 02/05/2013   Procedure: ARTHROPLASTY BIPOLAR HIP;  Surgeon: [name], MD;  Location: [facility];  Service: Orthopedics;  Laterality: Right;  . INTRAMEDULLARY (IM) NAIL INTERTROCHANTERIC Left 06/16/2016   Procedure: INTRAMEDULLARY (IM) NAIL INTERTROCHANTRIC;  Surgeon: Rod Can, MD;  Location: [facility];  Service: Orthopedics;  Laterality: Left;       Family History  Problem Relation Age of Onset  . Alzheimer's disease Mother   . Hypertension Mother   . Hyperlipidemia Father   .  Hypertension Father   . Healthy Maternal Grandmother   . Pneumonia Maternal Grandfather     Social History   Tobacco Use  . Smoking status: Former Smoker    Packs/day: 0.25    Years: 30.00    Pack years: 7.50    Types: Cigarettes    Quit date: 02/01/2016    Years since  quitting: 4.2  . Smokeless tobacco: Never Used  Vaping Use  . Vaping Use: Never used  Substance Use Topics  . Alcohol use: No  . Drug use: No    Home Medications Prior to Admission medications   Medication Sig Start Date End Date Taking? Authorizing Provider  ACCU-CHEK GUIDE test strip USE AS DIRECTED 12/19/19  Yes Azzie Glatter, [name]  amLODipine (NORVASC) 10 MG tablet TAKE 1 TABLET ONCE DAILY. Patient taking differently: Take 10 mg by mouth daily.  09/28/19  Yes Azzie Glatter, [name]  atorvastatin (LIPITOR) 40 MG tablet Take 1 tablet (40 mg total) by mouth daily. 03/29/20  Yes Welborn, Ryan, DO  furosemide (LASIX) 20 MG tablet Take 1 tablet (20 mg total) by mouth daily. 12/05/19  Yes [name], MD  glucose blood (ACCU-CHEK GUIDE) test strip Use as instructed 11/03/18  Yes Kathe Becton M, [name]  glucose blood (TRUE METRIX BLOOD GLUCOSE TEST) test strip CHECK BLOOD SUGAR UP TO 4 TIMES A DAY. Patient taking differently: 1 each by Other route See admin instructions. CHECK BLOOD SUGAR UP TO 2 TIMES A DAY. 05/25/18  Yes Azzie Glatter, [name]  hydrALAZINE (APRESOLINE) 50 MG tablet Take 50 mg by mouth 3 (three) times daily. 04/13/20  Yes [provider]  insulin glargine (LANTUS) 100 UNIT/ML injection Inject 0.06 mLs (6 Units total) into the skin at bedtime. 03/29/20  Yes Welborn, Ryan, DO  insulin lispro (HUMALOG KWIKPEN) 100 UNIT/ML KwikPen Max daily 24 units Per correctional scale Patient taking differently: Inject 1-7 Units into the skin 3 (three) times daily. Max daily 24 units Per correctional scale 01/03/20  Yes [name], [name], MD  Insulin Pen Needle 32G X 4 MM MISC 1 Device by Does not apply route in the morning, at noon, in the evening, and at bedtime. 01/03/20  Yes [name], [name], MD  Insulin Syringe-Needle U-100 (BD INSULIN SYRINGE U/F) 31G X 5/16" 0.3 ML MISC Inject 1 each as directed 2 (two) times daily. as directed 11/02/19  Yes Azzie Glatter, [name]   levothyroxine (SYNTHROID) 25 MCG tablet Take 25 mcg by mouth daily before breakfast.  04/13/20  Yes [provider]  vitamin B-12 1000 MCG tablet Take 1 tablet (1,000 mcg total) by mouth daily. 12/05/19  Yes [name], MD  Vitamin D, Ergocalciferol, (DRISDOL) 1.25 MG (50000 UNIT) CAPS capsule Take 50,000 Units by mouth every 7 (seven) days. Thursdays   Yes [provider]  Blood Glucose Monitoring Suppl (ACCU-CHEK GUIDE) w/Device KIT 1 Device by Does not apply route QID. Patient not taking: Reported on 01/03/2020 11/02/19   Azzie Glatter, [name]    Allergies    Patient has no known allergies.  Review of Systems   Review of Systems  Unable to perform ROS: Mental status change    Physical Exam Updated Vital Signs BP 99/79   Pulse 99   Temp 97.6 F (36.4 C) (Oral)   Resp (!) 28   Wt 68.7 kg   SpO2 100%   BMI 23.72 kg/m   Physical Exam Vitals and nursing note reviewed.  Constitutional:  Appearance: He is well-developed.  HENT:     Head: Normocephalic and atraumatic.  Eyes:     Pupils: Pupils are equal, round, and reactive to light.  Neck:     Vascular: No JVD.  Cardiovascular:     Rate and Rhythm: Bradycardia present. Rhythm irregular.  Pulmonary:     Effort: No respiratory distress.     Breath sounds: Normal breath sounds. No wheezing.     Comments: Tachypnea Abdominal:     General: Bowel sounds are normal. There is no distension.     Palpations: Abdomen is soft.     Tenderness: There is no abdominal tenderness.  Musculoskeletal:     Cervical back: Normal range of motion and neck supple.  Skin:    General: Skin is warm and dry.  Neurological:     Mental Status: He is alert.     Comments: Speech is slurred Patient is oriented x2. He is moving all 4 extremities. Gross sensory exam over the face and upper extremities normal. Pupils are equal, reactive to light.     ED Results / Procedures / Treatments   Labs (all labs ordered are  listed, but only abnormal results are displayed) Labs Reviewed  BASIC METABOLIC PANEL - Abnormal; Notable for the following components:      Result Value   Sodium 129 (*)    Potassium 7.4 (*)    Chloride 93 (*)    CO2 7 (*)    Glucose, Bld 1,197 (*)    BUN 69 (*)    Creatinine, Ser 5.23 (*)    Calcium 8.2 (*)    GFR calc non Af Amer 10 (*)    GFR calc Af Amer 11 (*)    Anion gap 29 (*)    All other components within normal limits  CBC WITH DIFFERENTIAL/PLATELET - Abnormal; Notable for the following components:   WBC 11.5 (*)    RBC 2.69 (*)    Hemoglobin 8.0 (*)    HCT 30.7 (*)    MCV 114.1 (*)    MCHC 26.1 (*)    Neutro Abs 9.4 (*)    Abs Immature Granulocytes 0.26 (*)    All other components within normal limits  BETA-HYDROXYBUTYRIC ACID - Abnormal; Notable for the following components:   Beta-Hydroxybutyric Acid 7.12 (*)    All other components within normal limits  CBG MONITORING, ED - Abnormal; Notable for the following components:   Glucose-Capillary >600 (*)    All other components within normal limits  CBG MONITORING, ED - Abnormal; Notable for the following components:   Glucose-Capillary >600 (*)    All other components within normal limits  CBG MONITORING, ED - Abnormal; Notable for the following components:   Glucose-Capillary >600 (*)    All other components within normal limits  CBG MONITORING, ED - Abnormal; Notable for the following components:   Glucose-Capillary >600 (*)    All other components within normal limits  SARS CORONAVIRUS 2 BY RT PCR (HOSPITAL ORDER, [facility])  CK  URINALYSIS, ROUTINE W REFLEX MICROSCOPIC  BETA-HYDROXYBUTYRIC ACID  VITAMIN B12    EKG EKG Interpretation  Date/Time:  Saturday April 14 2020 13:23:53 EDT Ventricular Rate:  66 PR Interval:    QRS Duration: 119 QT Interval:  451 QTC Calculation: 473 R Axis:   -80 Text Interpretation: Sinus rhythm Incomplete RBBB and LAFB PEAKED T WAVES new  changes noted Nonspecific ST and T wave abnormality Confirmed by Kathrynn Humble, Alyx Mcguirk [phone]) on  04/14/2020 5:21:26 PM   Radiology DG Chest Port 1 View  Result Date: 04/14/2020 CLINICAL DATA:  Hyperglycemia EXAM: PORTABLE CHEST 1 VIEW COMPARISON:  March 24, 2020 FINDINGS: The mediastinal contour and cardiac silhouette are stable. There is no focal infiltrate, pulmonary edema, or pleural effusion. The visualized bony structures are unremarkable. IMPRESSION: No active disease. Electronically Signed   By: Abelardo Diesel M.D.   On: 04/14/2020 14:31    Procedures .Critical Care Performed by: Varney Biles, MD Authorized by: Varney Biles, MD   Critical care provider statement:    Critical care time (minutes):  62   Critical care was necessary to treat or prevent imminent or life-threatening deterioration of the following conditions:  Metabolic crisis   Critical care was time spent personally by me on the following activities:  Discussions with consultants, evaluation of patient's response to treatment, examination of patient, ordering and performing treatments and interventions, ordering and review of laboratory studies, ordering and review of radiographic studies, pulse oximetry, re-evaluation of patient's condition, obtaining history from patient or surrogate and review of old charts   (including critical care time)  Medications Ordered in ED Medications  insulin regular, human (MYXREDLIN) 100 units/ 100 mL infusion (8.5 Units/hr Intravenous New Bag/Given 04/14/20 1351)  0.9 %  sodium chloride infusion (has no administration in time range)  dextrose 5 %-0.45 % sodium chloride infusion (has no administration in time range)  dextrose 50 % solution 0-50 mL (has no administration in time range)  lactated ringers bolus 1,374 mL (0 mL/kg  68.7 kg Intravenous Stopped 04/14/20 1552)  sodium bicarbonate injection 50 mEq (50 mEq Intravenous Given 04/14/20 1501)  calcium gluconate 1 g/ 50 mL sodium chloride IVPB  (0 g Intravenous Stopped 04/14/20 1633)  sodium chloride 0.9 % bolus 1,000 mL (1,000 mLs Intravenous New Bag/Given 04/14/20 1554)    ED Course  I have reviewed the triage vital signs and the nursing notes.  Pertinent labs & imaging results that were available during my care of the patient were reviewed by me and considered in my medical decision making (see chart for details).    MDM Rules/Calculators/A&P                          74 year old comes in w/ chief complaint of altered mental status. Patient was noted to have elevated blood glucose.  He was just discharged from nursing facility yesterday to an independent living facility.    Patient is noted to be tachypneic and has elevated blood glucose.  Were quite sure he is going to be in DKA.  DKA treatment has been initiated along with diagnostic work-up.  Additionally patient is noted to have heart rate in the 50s.  EKG ordered and we see peak T waves.  Potassium did come back elevated.  Given the T wave changes, we will treat the pseudohyperkalemia with calcium gluconate and sodium bicarb to provide some transient relief.  I don't think patient was appropriate for independent living at this time.  I spoke with patient's family member and Ms. [name] reported that she will be able to help further herself, as she does not trust the type of nursing home she he was at.  I consulted palliative service and they will see the patient to see if they can provide outpatient help.  Final Clinical Impression(s) / ED Diagnoses Final diagnoses:  Diabetic ketoacidosis without coma associated with type 2 diabetes mellitus ([place])    Rx / DC  Orders ED Discharge Orders    None       Varney Biles, MD 04/14/20 1724

## 2020-04-14 NOTE — ED Notes (Signed)
Report received from Mike, RN

## 2020-04-15 ENCOUNTER — Inpatient Hospital Stay (HOSPITAL_COMMUNITY): Payer: Medicare Other

## 2020-04-15 DIAGNOSIS — E1121 Type 2 diabetes mellitus with diabetic nephropathy: Secondary | ICD-10-CM

## 2020-04-15 DIAGNOSIS — Z794 Long term (current) use of insulin: Secondary | ICD-10-CM

## 2020-04-15 DIAGNOSIS — I739 Peripheral vascular disease, unspecified: Secondary | ICD-10-CM

## 2020-04-15 DIAGNOSIS — I10 Essential (primary) hypertension: Secondary | ICD-10-CM

## 2020-04-15 DIAGNOSIS — E1165 Type 2 diabetes mellitus with hyperglycemia: Secondary | ICD-10-CM

## 2020-04-15 DIAGNOSIS — Z8673 Personal history of transient ischemic attack (TIA), and cerebral infarction without residual deficits: Secondary | ICD-10-CM

## 2020-04-15 DIAGNOSIS — N189 Chronic kidney disease, unspecified: Secondary | ICD-10-CM

## 2020-04-15 DIAGNOSIS — E1110 Type 2 diabetes mellitus with ketoacidosis without coma: Principal | ICD-10-CM

## 2020-04-15 DIAGNOSIS — E538 Deficiency of other specified B group vitamins: Secondary | ICD-10-CM

## 2020-04-15 DIAGNOSIS — Z86718 Personal history of other venous thrombosis and embolism: Secondary | ICD-10-CM

## 2020-04-15 DIAGNOSIS — E875 Hyperkalemia: Secondary | ICD-10-CM

## 2020-04-15 LAB — GLUCOSE, CAPILLARY
Glucose-Capillary: 101 mg/dL — ABNORMAL HIGH (ref 70–99)
Glucose-Capillary: 109 mg/dL — ABNORMAL HIGH (ref 70–99)
Glucose-Capillary: 110 mg/dL — ABNORMAL HIGH (ref 70–99)
Glucose-Capillary: 116 mg/dL — ABNORMAL HIGH (ref 70–99)
Glucose-Capillary: 125 mg/dL — ABNORMAL HIGH (ref 70–99)
Glucose-Capillary: 125 mg/dL — ABNORMAL HIGH (ref 70–99)
Glucose-Capillary: 131 mg/dL — ABNORMAL HIGH (ref 70–99)
Glucose-Capillary: 152 mg/dL — ABNORMAL HIGH (ref 70–99)
Glucose-Capillary: 182 mg/dL — ABNORMAL HIGH (ref 70–99)
Glucose-Capillary: 200 mg/dL — ABNORMAL HIGH (ref 70–99)
Glucose-Capillary: 207 mg/dL — ABNORMAL HIGH (ref 70–99)
Glucose-Capillary: 99 mg/dL (ref 70–99)

## 2020-04-15 LAB — CBC WITH DIFFERENTIAL/PLATELET
Abs Immature Granulocytes: 0.03 10*3/uL (ref 0.00–0.07)
Basophils Absolute: 0 10*3/uL (ref 0.0–0.1)
Basophils Relative: 0 %
Eosinophils Absolute: 0.2 10*3/uL (ref 0.0–0.5)
Eosinophils Relative: 2 %
HCT: 25.9 % — ABNORMAL LOW (ref 39.0–52.0)
Hemoglobin: 8.4 g/dL — ABNORMAL LOW (ref 13.0–17.0)
Immature Granulocytes: 0 %
Lymphocytes Relative: 20 %
Lymphs Abs: 2.4 10*3/uL (ref 0.7–4.0)
MCH: 29.5 pg (ref 26.0–34.0)
MCHC: 32.4 g/dL (ref 30.0–36.0)
MCV: 90.9 fL (ref 80.0–100.0)
Monocytes Absolute: 1.3 10*3/uL — ABNORMAL HIGH (ref 0.1–1.0)
Monocytes Relative: 11 %
Neutro Abs: 7.8 10*3/uL — ABNORMAL HIGH (ref 1.7–7.7)
Neutrophils Relative %: 67 %
Platelets: 243 10*3/uL (ref 150–400)
RBC: 2.85 MIL/uL — ABNORMAL LOW (ref 4.22–5.81)
RDW: 13.5 % (ref 11.5–15.5)
WBC: 11.7 10*3/uL — ABNORMAL HIGH (ref 4.0–10.5)
nRBC: 0 % (ref 0.0–0.2)

## 2020-04-15 LAB — HEPATIC FUNCTION PANEL
ALT: 15 U/L (ref 0–44)
AST: 23 U/L (ref 15–41)
Albumin: 2.7 g/dL — ABNORMAL LOW (ref 3.5–5.0)
Alkaline Phosphatase: 67 U/L (ref 38–126)
Bilirubin, Direct: 0.1 mg/dL (ref 0.0–0.2)
Indirect Bilirubin: 0.4 mg/dL (ref 0.3–0.9)
Total Bilirubin: 0.5 mg/dL (ref 0.3–1.2)
Total Protein: 5.7 g/dL — ABNORMAL LOW (ref 6.5–8.1)

## 2020-04-15 LAB — BASIC METABOLIC PANEL
Anion gap: 10 (ref 5–15)
Anion gap: 11 (ref 5–15)
BUN: 63 mg/dL — ABNORMAL HIGH (ref 8–23)
BUN: 63 mg/dL — ABNORMAL HIGH (ref 8–23)
CO2: 20 mmol/L — ABNORMAL LOW (ref 22–32)
CO2: 19 mmol/L — ABNORMAL LOW (ref 22–32)
Calcium: 8.1 mg/dL — ABNORMAL LOW (ref 8.9–10.3)
Calcium: 8 mg/dL — ABNORMAL LOW (ref 8.9–10.3)
Chloride: 108 mmol/L (ref 98–111)
Chloride: 108 mmol/L (ref 98–111)
Creatinine, Ser: 4.66 mg/dL — ABNORMAL HIGH (ref 0.61–1.24)
Creatinine, Ser: 4.49 mg/dL — ABNORMAL HIGH (ref 0.61–1.24)
GFR calc Af Amer: 13 mL/min — ABNORMAL LOW (ref >60)
GFR calc Af Amer: 14 mL/min — ABNORMAL LOW (ref >60)
GFR calc non Af Amer: 11 mL/min — ABNORMAL LOW (ref >60)
GFR calc non Af Amer: 12 mL/min — ABNORMAL LOW (ref >60)
Glucose, Bld: 110 mg/dL — ABNORMAL HIGH (ref 70–99)
Glucose, Bld: 128 mg/dL — ABNORMAL HIGH (ref 70–99)
Potassium: 4.6 mmol/L (ref 3.5–5.1)
Potassium: 4.3 mmol/L (ref 3.5–5.1)
Sodium: 138 mmol/L (ref 135–145)
Sodium: 138 mmol/L (ref 135–145)

## 2020-04-15 LAB — VITAMIN B12: Vitamin B-12: 3509 pg/mL — ABNORMAL HIGH (ref 180–914)

## 2020-04-15 LAB — MAGNESIUM: Magnesium: 2.3 mg/dL (ref 1.7–2.4)

## 2020-04-15 LAB — PHOSPHORUS: Phosphorus: 4.6 mg/dL (ref 2.5–4.6)

## 2020-04-15 LAB — BETA-HYDROXYBUTYRIC ACID: Beta-Hydroxybutyric Acid: 0.14 mmol/L (ref 0.05–0.27)

## 2020-04-15 LAB — MRSA PCR SCREENING: MRSA by PCR: NEGATIVE

## 2020-04-15 MED ORDER — ATORVASTATIN CALCIUM 40 MG PO TABS
40.0000 mg | ORAL_TABLET | Freq: Every day | ORAL | Status: DC
  Administered 2020-04-15 – 2020-04-20 (×6): 40 mg via ORAL
  Filled 2020-04-15 (×6): qty 1

## 2020-04-15 MED ORDER — SODIUM CHLORIDE 0.9 % IV SOLN: INTRAVENOUS | Status: DC

## 2020-04-15 MED ORDER — INSULIN ASPART 100 UNIT/ML ~~LOC~~ SOLN: 0.0000 [IU] | Freq: Every day | SUBCUTANEOUS | Status: DC

## 2020-04-15 MED ORDER — OXYCODONE HCL 5 MG PO TABS
5.0000 mg | ORAL_TABLET | ORAL | Status: DC | PRN
  Administered 2020-04-15 – 2020-04-18 (×4): 5 mg via ORAL
  Filled 2020-04-15 (×4): qty 1

## 2020-04-15 MED ORDER — INSULIN ASPART 100 UNIT/ML ~~LOC~~ SOLN
0.0000 [IU] | Freq: Three times a day (TID) | SUBCUTANEOUS | Status: DC
  Administered 2020-04-15: 2 [IU] via SUBCUTANEOUS
  Administered 2020-04-15: 3 [IU] via SUBCUTANEOUS
  Administered 2020-04-16: 2 [IU] via SUBCUTANEOUS
  Administered 2020-04-16: 7 [IU] via SUBCUTANEOUS
  Administered 2020-04-16: 5 [IU] via SUBCUTANEOUS
  Administered 2020-04-17: 1 [IU] via SUBCUTANEOUS
  Administered 2020-04-17: 3 [IU] via SUBCUTANEOUS
  Administered 2020-04-17: 5 [IU] via SUBCUTANEOUS
  Administered 2020-04-18: 3 [IU] via SUBCUTANEOUS
  Administered 2020-04-18 – 2020-04-19 (×2): 2 [IU] via SUBCUTANEOUS
  Administered 2020-04-19: 5 [IU] via SUBCUTANEOUS
  Administered 2020-04-20: 3 [IU] via SUBCUTANEOUS
  Administered 2020-04-20: 7 [IU] via SUBCUTANEOUS

## 2020-04-15 MED ORDER — HYDRALAZINE HCL 50 MG PO TABS: 50.0000 mg | ORAL_TABLET | Freq: Three times a day (TID) | ORAL | Status: DC

## 2020-04-15 MED ORDER — INSULIN GLARGINE 100 UNIT/ML ~~LOC~~ SOLN
4.0000 [IU] | Freq: Every day | SUBCUTANEOUS | Status: DC
  Administered 2020-04-15 – 2020-04-16 (×2): 4 [IU] via SUBCUTANEOUS
  Filled 2020-04-15 (×2): qty 0.04

## 2020-04-15 MED ORDER — LEVOTHYROXINE SODIUM 25 MCG PO TABS
25.0000 ug | ORAL_TABLET | Freq: Every day | ORAL | Status: DC
  Administered 2020-04-16 – 2020-04-20 (×5): 25 ug via ORAL
  Filled 2020-04-15 (×5): qty 1
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
## 2020-04-16 DIAGNOSIS — Z7189 Other specified counseling: Secondary | ICD-10-CM

## 2020-04-16 DIAGNOSIS — R531 Weakness: Secondary | ICD-10-CM

## 2020-04-16 DIAGNOSIS — Z515 Encounter for palliative care: Secondary | ICD-10-CM

## 2020-04-16 LAB — CBC WITH DIFFERENTIAL/PLATELET
Abs Immature Granulocytes: 0.03 10*3/uL (ref 0.00–0.07)
Basophils Absolute: 0.1 10*3/uL (ref 0.0–0.1)
Basophils Relative: 1 %
Eosinophils Absolute: 0.5 10*3/uL (ref 0.0–0.5)
Eosinophils Relative: 6 %
HCT: 26.8 % — ABNORMAL LOW (ref 39.0–52.0)
Hemoglobin: 8.3 g/dL — ABNORMAL LOW (ref 13.0–17.0)
Immature Granulocytes: 0 %
Lymphocytes Relative: 30 %
Lymphs Abs: 2.7 10*3/uL (ref 0.7–4.0)
MCH: 29.3 pg (ref 26.0–34.0)
MCHC: 31 g/dL (ref 30.0–36.0)
MCV: 94.7 fL (ref 80.0–100.0)
Monocytes Absolute: 0.8 10*3/uL (ref 0.1–1.0)
Monocytes Relative: 8 %
Neutro Abs: 5.1 10*3/uL (ref 1.7–7.7)
Neutrophils Relative %: 55 %
Platelets: 205 10*3/uL (ref 150–400)
RBC: 2.83 MIL/uL — ABNORMAL LOW (ref 4.22–5.81)
RDW: 14.2 % (ref 11.5–15.5)
WBC: 9.1 10*3/uL (ref 4.0–10.5)
nRBC: 0 % (ref 0.0–0.2)

## 2020-04-16 LAB — GLUCOSE, CAPILLARY
Glucose-Capillary: 279 mg/dL — ABNORMAL HIGH (ref 70–99)
Glucose-Capillary: 303 mg/dL — ABNORMAL HIGH (ref 70–99)
Glucose-Capillary: 178 mg/dL — ABNORMAL HIGH (ref 70–99)
Glucose-Capillary: 180 mg/dL — ABNORMAL HIGH (ref 70–99)

## 2020-04-16 LAB — BASIC METABOLIC PANEL
Anion gap: 8 (ref 5–15)
BUN: 60 mg/dL — ABNORMAL HIGH (ref 8–23)
CO2: 19 mmol/L — ABNORMAL LOW (ref 22–32)
Calcium: 7.9 mg/dL — ABNORMAL LOW (ref 8.9–10.3)
Chloride: 108 mmol/L (ref 98–111)
Creatinine, Ser: 4.63 mg/dL — ABNORMAL HIGH (ref 0.61–1.24)
GFR calc Af Amer: 13 mL/min — ABNORMAL LOW (ref >60)
GFR calc non Af Amer: 11 mL/min — ABNORMAL LOW (ref >60)
Glucose, Bld: 196 mg/dL — ABNORMAL HIGH (ref 70–99)
Potassium: 4.7 mmol/L (ref 3.5–5.1)
Sodium: 135 mmol/L (ref 135–145)

## 2020-04-16 MED ORDER — INSULIN GLARGINE 100 UNIT/ML ~~LOC~~ SOLN
6.0000 [IU] | Freq: Every day | SUBCUTANEOUS | Status: DC
  Administered 2020-04-17 – 2020-04-18 (×2): 6 [IU] via SUBCUTANEOUS
  Filled 2020-04-16 (×3): qty 0.06

## 2020-04-16 MED ORDER — INSULIN ASPART 100 UNIT/ML ~~LOC~~ SOLN
2.0000 [IU] | Freq: Three times a day (TID) | SUBCUTANEOUS | Status: DC
  Administered 2020-04-16 – 2020-04-18 (×5): 2 [IU] via SUBCUTANEOUS

## 2020-04-16 MED ORDER — HYDRALAZINE HCL 20 MG/ML IJ SOLN
10.0000 mg | Freq: Four times a day (QID) | INTRAMUSCULAR | Status: DC | PRN
  Administered 2020-04-16 – 2020-04-17 (×3): 10 mg via INTRAVENOUS
  Filled 2020-04-16 (×3): qty 1

## 2020-04-16 MED ORDER — HYDRALAZINE HCL 50 MG PO TABS
50.0000 mg | ORAL_TABLET | Freq: Three times a day (TID) | ORAL | Status: DC
  Administered 2020-04-16 – 2020-04-18 (×7): 50 mg via ORAL
  Filled 2020-04-16 (×7): qty 1

## 2020-04-16 MED ORDER — AMLODIPINE BESYLATE 10 MG PO TABS
10.0000 mg | ORAL_TABLET | Freq: Every day | ORAL | Status: DC
  Administered 2020-04-16 – 2020-04-20 (×5): 10 mg via ORAL
  Filled 2020-04-16 (×5): qty 1

## 2020-04-16 NOTE — NC FL2 (Signed)
[place] LEVEL OF CARE SCREENING TOOL     IDENTIFICATION  Patient Name: [name] [name] Birthdate: 1946-04-29 Sex: male Admission Date (Current Location): 04/14/2020  [facility] [place] Number:  Herbalist and Address:  [facility],  [place], [place]      Provider Number: [id number]  Attending Physician Name and Address:  [name], DO  Relative Name and Phone Number:       Current Level of Care: Hospital Recommended Level of Care: [place] Prior Approval Number:    Date Approved/Denied:   PASRR Number: [id number]  Discharge Plan: SNF    Current Diagnoses: Patient Active Problem List   Diagnosis Date Noted  . DKA (diabetic ketoacidoses) ([place]) 04/14/2020  . PVD (peripheral vascular disease) ([place]) 02/02/2020  . Poor social situation 12/08/2019  . CKD (chronic kidney disease) stage 4, GFR 15-29 ml/min ([name]) 12/08/2019  . Cognitive decline 11/22/2019  . ETOH abuse 05/17/2019  . Chronic diastolic CHF (congestive heart failure) ([place]) 05/17/2019  . Uncontrolled type 2 diabetes mellitus with hyperglycemia, with long-term current use of insulin ([place]) 10/03/2018  . Underweight 11/18/2017  . Anemia 11/18/2017  . DKA, type 2 ([place]) 08/01/2017  . History of cerebrovascular accident (CVA) in adulthood 07/25/2017  . Depression with anxiety 07/25/2017  . Left ventricular diastolic dysfunction 26/20/3559  . Dementia ([place]) 07/25/2017  . Brittle diabetes mellitus ([place]) 06/28/2017  . Type 2 diabetes mellitus with hyperlipidemia ([place]) 06/27/2017  . Hypertension 06/27/2017  . Hyperkalemia 01/16/2017  . Acute metabolic encephalopathy 74/16/3845  . Weakness 11/16/2016  . Acute kidney injury superimposed on chronic kidney disease ([place])   . Noncompliance with medication regimen 08/17/2016  . History of DVT (deep vein thrombosis) 07/06/2016  . Vitamin B 12 deficiency 06/18/2016  . Hyponatremia 05/15/2016  .  Anxiety 05/15/2016  . Narcotic dependency, continuous ([place]) 11/02/2015  . Uncontrolled type 2 diabetes mellitus with diabetic nephropathy, with long-term current use of insulin ([place]) 08/12/2015    Orientation RESPIRATION BLADDER Height & Weight     Self, Time, Situation, Place  Normal Incontinent Weight: 68.7 kg Height:     BEHAVIORAL SYMPTOMS/MOOD NEUROLOGICAL BOWEL NUTRITION STATUS      Continent Diet (regular)  AMBULATORY STATUS COMMUNICATION OF NEEDS Skin   Extensive Assist Verbally Normal                       Personal Care Assistance Level of Assistance  Bathing, Feeding, Dressing Bathing Assistance: Maximum assistance Feeding assistance: Limited assistance Dressing Assistance: Limited assistance     Functional Limitations Info  Hearing, Speech, Sight Sight Info: Adequate Hearing Info: Adequate Speech Info: Impaired (cva speech is slurred)    SPECIAL CARE FACTORS FREQUENCY  PT (By licensed PT), OT (By licensed OT), Speech therapy     PT Frequency: 5x/weekly OT Frequency: 5xweek     Speech Therapy Frequency: 3xweekly      Contractures Contractures Info: Not present    Additional Factors Info  Code Status Code Status Info: full Allergies Info: nka           Current Medications (04/16/2020):  This is the current hospital active medication list Current Facility-Administered Medications  Medication Dose Route Frequency Provider Last Rate Last Admin  . 0.9 %  sodium chloride infusion   Intravenous Continuous Raiford Noble [place], DO 75 mL/hr at 04/15/20 1600 Rate Verify at 04/15/20 1600  . atorvastatin (LIPITOR) tablet 40 mg  40 mg Oral  Daily Raiford Noble [place], [place]   40 mg at 04/15/20 1631  . Chlorhexidine Gluconate Cloth 2 % PADS 6 each  6 each Topical Q0600 Donnamae Jude, MD   6 each at 04/16/20 [phone]  . dextrose 50 % solution 0-50 mL  0-50 mL Intravenous PRN Donnamae Jude, MD      . enoxaparin (LOVENOX) injection 30 mg  30 mg Subcutaneous Q24H Donnamae Jude, MD   30 mg at 04/15/20 2137  . HYDROmorphone (DILAUDID) injection 0.5-1 mg  0.5-1 mg Intravenous Q2H PRN Donnamae Jude, MD      . insulin aspart (novoLOG) injection 0-5 Units  0-5 Units Subcutaneous QHS Opyd, Timothy S, MD      . insulin aspart (novoLOG) injection 0-9 Units  0-9 Units Subcutaneous TID WC Opyd, Ilene Qua, MD   2 Units at 04/15/20 1632  . insulin glargine (LANTUS) injection 4 Units  4 Units Subcutaneous Daily Opyd, Ilene Qua, MD   4 Units at 04/15/20 0551  . levothyroxine (SYNTHROID) tablet 25 mcg  25 mcg Oral QAC breakfast Raiford Noble [place], [place]   25 mcg at 04/16/20 2334  . metoprolol tartrate (LOPRESSOR) injection 5 mg  5 mg Intravenous Q6H PRN Donnamae Jude, MD   5 mg at 04/16/20 0220  . oxyCODONE (Oxy IR/ROXICODONE) immediate release tablet 5 mg  5 mg Oral Q4H PRN Raiford Noble [place], DO   5 mg at 04/16/20 3568  . polyethylene glycol (MIRALAX / GLYCOLAX) packet 17 g  17 g Oral Daily PRN Donnamae Jude, MD         Discharge Medications: Please see discharge summary for a list of discharge medications.  Relevant Imaging Results:  Relevant Lab Results:   Additional Information SHU:[id number]  Leeroy Cha, RN

## 2020-04-16 NOTE — Progress Notes (Signed)
PROGRESS NOTE    [name] [name]  WEX:[id number] DOB: Mar 02, 1946 DOA: 04/14/2020 PCP: Libby Maw, MD   Brief Narrative:  HPI per Dr. Darron Doom on 04/14/20  [name] [name] is a 74 y.o. male with medical history significant of type 2 diabetes, history of CVA with mild cognitive impairment, hypertension, chronic kidney disease stage IV who was recently admitted from 6/12-6/17 with acute encephalopathy related to hyperosmolar hyperglycemia.  At that time his Lantus was reduced to 6 units/day with sliding scale for meal coverage due to ongoing hypoglycemic events.  At that admission he was discharged to a skilled nursing facility due to a complicated social situation.  He has ongoing cognitive impairment and is being cared for by 2 sisters who are also responsible for caring for his 82 year old mother and a brother with Down syndrome.  Apparently he had fallen at the nursing home and was just not acting like himself.  According to the nursing home he had hyperglycemia for the past 2 days.  History was obtained by reading the chart in the ED note as the patient is unable to give correct history at this time  ED Course: In the emergency department he was found to be confused and hyperventilating.  Labs revealed anion gap acidemia consistent with DKA.  He was also noted to be hyperglycemic with a blood sugar in the 1197 range.  His bicarb was 7 the anion gap was 29.  At that time his potassium was 7.4.  He was noted to be anemic with a hemoglobin of 8.0 in the setting of known vitamin B12 deficiency.  We are asked to admit for correction of his ongoing DKA.  **Interim History He is complaining of some right leg pain but he is more awake and wanting food.  Is transition off the insulin drip and onto his long-acting insulin and started on a diet.  Assessment & Plan:   Principal Problem:   DKA (diabetic ketoacidoses) ([place]) Active Problems:   Uncontrolled type 2 diabetes mellitus with diabetic  nephropathy, with long-term current use of insulin ([name])   Vitamin B 12 deficiency   History of DVT (deep vein thrombosis)   Acute kidney injury superimposed on chronic kidney disease ([name])   Weakness   Acute metabolic encephalopathy   Hyperkalemia   Type 2 diabetes mellitus with hyperlipidemia ([name])   Hypertension   History of cerebrovascular accident (CVA) in adulthood   DKA, type 2 ([place])   Anemia   Uncontrolled type 2 diabetes mellitus with hyperglycemia, with long-term current use of insulin ([name])   PVD (peripheral vascular disease) ([place])  DKA, improved Pseudohyponatremia in the setting of his hyperglycemia -Suspect falls and altered mental status is all related to DKA. -Unclear why he went into DKA but could be secondary to noncompliance -Make sure to rule out infection so will obtain a urinalysis; head CT was negative, chest x-ray showed no active disease -IV fluid bolus complete and he is given a lactated Ringer's 1.374 L as well as a 1 L normal saline bolus -Given Calcium Gluconate 1,000 mg -Started on Endo tool with insulin GTT and was transitioned to Long Acting Insulin; see below -Was N.p.o. but diet was started with Carb Modified Diet  -Trended Serial labs with correction of electrolyte abnormalities as needed -BMP done earlier this morning showed a CO2 of 19, anion gap of 8, chloride level 108 -His beta hydroxybutyrate acid was 7.12 on admission is now 0.14 -Sodium on admission was 129 and after IV fluid  resuscitation and correction his glucose is now 138 -Continue to monitor and trend and repeat CMP in a.m.  -He is currently getting D5 half-normal saline at 75 MLS per hour but will stop this and start normal saline at 75 mL's per hour given his already been transitioned to long-acting insulin and because he is eating -See below  Poorly controlled diabetes with diabetic nephropathy and long-term use of insulin -He wasOn Endo tool for now and transitioned to Long Acting  Insulin -Given this is a second admission for similar problems will need to titrate his insulin very carefully to ensure he does not represent for another admission -He is on Lantus 4 units subcu daily as well as sensitive NovoLog/scale insulin before meals and at bedtime; we will increase the Lantus to 6 units subcu daily and also add 2 units of NovoLog 3 times daily with meals if he eats greater than 50% of his meals -CBGs ranging from 152-303 -Recent hemoglobin A1c was 10.3 -We will consult diabetes education coordinator for further evaluation recommendations -Care consulted given his frequent hospitalizations and will try and address goals of care  Vitamin D Deficiency -Holding his vitamin D for now will need resumption at discharge  History of DVT -No evidence of DVT at present, on Lovenox  30 mg for DVT prophylaxis  Acute Kidney Injury on chronic kidney disease Stage IV Metabolic Acidosis -Likely related to dehydration and DKA,  -Baseline creatinine is in the 2-3 range was 3.2 at last discharge. -Patient presented with a BUN/creatinine of 69/5.23 and is now 63/4.49 yesterday and slightly worse today with a BUN/Cr of 60/4.63 -Has a mild metabolic acidosis with a CO2 of 19, anion gap of 8, chloride level 108 -Continue hydration but will change to normal saline at 75 MLS per hour -Avoid nephrotoxic medications, contrast dyes, hypotension and renally dose medications -Continue monitor and trend renal function repeat CMP in a.m.  Generalized Weakness and Fall -Likely related to DKA and Hyperglycemia -Obtain PT/OT Evaluation and they are recommending SNF -Check TSH, B12 was 3509 and CK was 225 -Received no results-aspirin placed 3 weeks ago and likely can go back but will have a co-pay of $2576 per TOC.  Will need a safe discharge disposition as he likely cannot live by himself anymore given his recurrent hospitalizations and poor medical insight to his disease processes  Acute  Metabolic Encephalopathy -Likely related to DKA -Head CT done and showed "No acute abnormality. Atrophy and chronic microvascular ischemic change." -Continue to Monitor and Place on Delirium Precautions  Hyperkalemia, improved  -Correct with insulin IV fluid hydration,  -Will need to monitor electrolytes closely and replete as necessary -Patient's potassium is now 4.7 -Continue to monitor and trend and repeat CMP in a.m.  Hyperlipidemia -Resume Atorvastatin 40 mg po daily   Hypertension -Hypotensive at time of admission but now improved -Resumed Amlodipine 10 mg po Daily and Hydralazine 50 mg po TID -Also will Discontinue Lopressor 5 mg PRN and start Hydralazine 10 mg IV q6hprn for SBP>170 or DBP>90 -Continue to Monitor BP per Protocol   History of CVA -Continues to have Ongoing cognitive delays -Delirium Precautions -PT/OT to evaluate and Treat and they are recommending SNF -Resume Home Atorvastatin  Microcytic/Normocytic Anemia likely in the setting of chronic kidney disease -Hemoglobin is 8.0 on admission; repeat hemoglobin/hematocrit is now 8.4/25.9 yesterday and today is 8.3/26.8 -Will transfuse if less than 7.   -B12 was 3,509 -Check Anemia panel in the a.m. -Continue to monitor for signs  and symptoms of bleeding; currently no overt bleeding noted -Repeat CBC in a.m.  Right Leg/Shin Pain -DG Tib/Fib showed "Mild diffuse decreased bone mineralization. No evidence of acute fracture or dislocation." -Pain is better today   Peripheral vascular disease -Resume Home Atorvastatin   Leukocytosis -Mild and in the setting of DKA -WBC wernt from 11.5 -> 11.7 -> 9.1 -Continue to monitor for signs and symptoms of infection -Repeat CBC in a.m.  DVT prophylaxis: Enoxaparin 30 mg sq q24h Code Status: FULL CODE Family Communication: No family present ata bedside  Disposition Plan: Pending further improvement and evaluation by PT and OT and they are recommending SNF   Status is: Inpatient  Remains inpatient appropriate because:Unsafe d/c plan, IV treatments appropriate due to intensity of illness or inability to take PO and Inpatient level of care appropriate due to severity of illness   Dispo: The patient is from: Home              Anticipated d/c is to: SNF              Anticipated d/c date is: 2 days              Patient currently is not medically stable to d/c.  Consultants:   Palliative Care   Procedures:  None  Antimicrobials:  Anti-infectives (From admission, onward)   None     Subjective: Seen and examined at bedside he he states he is feeling better and states that his leg is not hurting anymore.  No nausea or vomiting.  Is eating his breakfast.  No chest pain, lightheadedness or dizziness but no other concerns reported at this time.  Objective: Vitals:   04/16/20 1200 04/16/20 1300 04/16/20 1345 04/16/20 1500  BP: (!) 159/68 (!) 177/91 (!) 149/78 (!) 179/77  Pulse: 73 95 82 73  Resp: 14 17 17 14   Temp: 98.4 F (36.9 C)     TempSrc: Oral     SpO2: 99% 100% 100% 100%  Weight:        Intake/Output Summary (Last 24 hours) at 04/16/2020 1600 Last data filed at 04/16/2020 1500 Gross per 24 hour  Intake 1738.8 ml  Output 1625 ml  Net 113.8 ml   Filed Weights   04/14/20 1319  Weight: 68.7 kg   Examination: Physical Exam:  Constitutional: The patient is a thin chronically ill-appearing male currently in no acute distress appears calm and comfortable eating his breakfast. Eyes: Lids and conjunctivae normal, sclerae anicteric  ENMT: External Ears, Nose appear normal. Grossly normal hearing. Neck: Appears normal, supple, no cervical masses, normal ROM, no appreciable thyromegaly; no JVD Respiratory: Diminished to auscultation bilaterally, no wheezing, rales, rhonchi or crackles. Normal respiratory effort and patient is not tachypenic. No accessory muscle use.  Unlabored breathing Cardiovascular: RRR, no murmurs / rubs /  gallops. S1 and S2 auscultated.  Abdomen: Soft, non-tender, non-distended. Bowel sounds positive.  GU: Deferred. Musculoskeletal: No clubbing / cyanosis of digits/nails. No joint deformity upper and lower extremities.  Skin: No rashes, lesions, ulcers on limited skin evaluation and he does have a right leg bandage. No induration; Warm and dry.  Neurologic: CN 2-12 grossly intact with no focal deficits.  Romberg sign and cerebellar reflexes not assessed.  Psychiatric: Mildly impaired judgment and insight. Alert and oriented. Normal mood and appropriate affect.   Data Reviewed: I have personally reviewed following labs and imaging studies  CBC: Recent Labs  Lab 04/14/20 1315 04/15/20 0823 04/16/20 0327  WBC 11.5* 11.7* 9.1  NEUTROABS 9.4* 7.8* 5.1  HGB 8.0* 8.4* 8.3*  HCT 30.7* 25.9* 26.8*  MCV 114.1* 90.9 94.7  PLT 284 243 888   Basic Metabolic Panel: Recent Labs  Lab 04/14/20 1315 04/14/20 2219 04/15/20 0347 04/15/20 0707 04/15/20 0823 04/16/20 0327  NA 129* 136 138 138  --  135  K 7.4* 4.7 4.6 4.3  --  4.7  CL 93* 104 108 108  --  108  CO2 7* 15* 20* 19*  --  19*  GLUCOSE 1,197* 426* 110* 128*  --  196*  BUN 69* 64* 63* 63*  --  60*  CREATININE 5.23* 4.86* 4.66* 4.49*  --  4.63*  CALCIUM 8.2* 8.6* 8.1* 8.0*  --  7.9*  MG  --   --   --   --  2.3  --   PHOS  --   --   --   --  4.6  --    GFR: Estimated Creatinine Clearance: 12.9 mL/min (A) (by C-G formula based on SCr of 4.63 mg/dL (H)). Liver Function Tests: Recent Labs  Lab 04/15/20 0834  AST 23  ALT 15  ALKPHOS 67  BILITOT 0.5  PROT 5.7*  ALBUMIN 2.7*   No results for input(s): LIPASE, AMYLASE in the last 168 hours. No results for input(s): AMMONIA in the last 168 hours. Coagulation Profile: No results for input(s): INR, PROTIME in the last 168 hours. Cardiac Enzymes: Recent Labs  Lab 04/14/20 1542  CKTOTAL 225   BNP (last 3 results) No results for input(s): PROBNP in the last 8760 hours. HbA1C:  No results for input(s): HGBA1C in the last 72 hours. CBG: Recent Labs  Lab 04/15/20 1202 04/15/20 1553 04/15/20 2133 04/16/20 0742 04/16/20 1210  GLUCAP 207* 182* 152* 279* 303*   Lipid Profile: No results for input(s): CHOL, HDL, LDLCALC, TRIG, CHOLHDL, LDLDIRECT in the last 72 hours. Thyroid Function Tests: No results for input(s): TSH, T4TOTAL, FREET4, T3FREE, THYROIDAB in the last 72 hours. Anemia Panel: Recent Labs    04/15/20 0347  VITAMINB12 3,509*   Sepsis Labs: No results for input(s): PROCALCITON, LATICACIDVEN in the last 168 hours.  Recent Results (from the past 240 hour(s))  SARS Coronavirus 2 by RT PCR (hospital order, performed in [facility] hospital lab) Nasopharyngeal Nasopharyngeal Swab     Status: None   Collection Time: 04/14/20  1:44 PM   Specimen: Nasopharyngeal Swab  Result Value Ref Range Status   SARS Coronavirus 2 NEGATIVE NEGATIVE Final    Comment: (NOTE) SARS-CoV-2 target nucleic acids are NOT DETECTED.  The SARS-CoV-2 RNA is generally detectable in upper and lower respiratory specimens during the acute phase of infection. The lowest concentration of SARS-CoV-2 viral copies this assay can detect is 250 copies / mL. A negative result does not preclude SARS-CoV-2 infection and should not be used as the sole basis for treatment or other patient management decisions.  A negative result may occur with improper specimen collection / handling, submission of specimen other than nasopharyngeal swab, presence of viral mutation(s) within the areas targeted by this assay, and inadequate number of viral copies (<250 copies / mL). A negative result must be combined with clinical observations, patient history, and epidemiological information.  Fact Sheet for Patients:   StrictlyIdeas.no  Fact Sheet for Healthcare Providers: BankingDealers.co.za  This test is not yet approved or  cleared by the [place] FDA and has been authorized for detection and/or diagnosis of SARS-CoV-2 by FDA under an Emergency Use Authorization (EUA).  This EUA will remain in effect (meaning this test can be used) for the duration of the COVID-19 declaration under Section 564(b)(1) of the Act, 21 U.S.C. section 360bbb-3(b)(1), unless the authorization is terminated or revoked sooner.  Performed at [facility], [place] [address], [place], [place] [address]   MRSA PCR Screening     Status: None   Collection Time: 04/14/20  9:57 PM   Specimen: Nasal Mucosa; Nasopharyngeal  Result Value Ref Range Status   MRSA by PCR NEGATIVE NEGATIVE Final    Comment:        The GeneXpert MRSA Assay (FDA approved for NASAL specimens only), is one component of a comprehensive MRSA colonization surveillance program. It is not intended to diagnose MRSA infection nor to guide or monitor treatment for MRSA infections. Performed at [facility], [place] [address] [place],  [address]     RN Pressure Injury Documentation:     Estimated body mass index is 23.72 kg/m as calculated from the following:   Height as of 01/03/20: 5\' 7"  (1.702 m).   Weight as of this encounter: 68.7 kg.  Malnutrition Type:      Malnutrition Characteristics:      Nutrition Interventions:    Radiology Studies: DG Tibia/Fibula Right  Result Date: 04/15/2020 CLINICAL DATA:  Recent fall with right lower leg pain. EXAM: RIGHT TIBIA AND FIBULA - 2 VIEW COMPARISON:  None. FINDINGS: Mild diffuse decreased bone mineralization. No evidence of acute fracture or dislocation. IMPRESSION: No acute findings. Electronically Signed   By: Marin Olp M.D.   On: 04/15/2020 11:10   CT Head Wo Contrast  Result Date: 04/14/2020 CLINICAL DATA:  Status post fall.  Initial encounter. EXAM: CT HEAD WITHOUT CONTRAST TECHNIQUE: Contiguous axial images were obtained from the base of the skull through the vertex without  intravenous contrast. COMPARISON:  Head CT scan 03/24/2020. FINDINGS: Brain: No evidence of acute infarction, hemorrhage, hydrocephalus, extra-axial collection or mass lesion/mass effect. Atrophy and chronic microvascular ischemic change again seen. Vascular: No hyperdense vessel or unexpected calcification. Skull: Intact.  No focal lesion. Sinuses/Orbits: Negative. Other: None. IMPRESSION: No acute abnormality. Atrophy and chronic microvascular ischemic change. Electronically Signed   By: Inge Rise M.D.   On: 04/14/2020 17:28   Scheduled Meds: . amLODipine  10 mg Oral Daily  . atorvastatin  40 mg Oral Daily  . Chlorhexidine Gluconate Cloth  6 each Topical Q0600  . enoxaparin (LOVENOX) injection  30 mg Subcutaneous Q24H  . hydrALAZINE  50 mg Oral TID  . insulin aspart  0-5 Units Subcutaneous QHS  . insulin aspart  0-9 Units Subcutaneous TID WC  . insulin aspart  2 Units Subcutaneous TID WC  . [START ON 04/17/2020] insulin glargine  6 Units Subcutaneous Daily  . levothyroxine  25 mcg Oral QAC breakfast   Continuous Infusions: . sodium chloride 75 mL/hr at 04/16/20 1100    LOS: 2 days   [name], DO [facility] PAGER is on [place]  If 7PM-7AM, please contact night-coverage [email or web address]

## 2020-04-16 NOTE — TOC Initial Note (Addendum)
Transition of Care [facility]) - Initial/Assessment Note    Patient Details  Name: [name] [name] MRN: [id number] Date of Birth: 1946-06-28  Transition of Care [facility]) CM/SW Contact:    Leeroy Cha, RN Phone Number: 04/16/2020, 8:37 AM  Clinical Narrative:                 Admitted for dka lives byself, hx of cva, pt-snf placment was in [place] 3 weeks ago.   fl2 sent out to area snf. Spoke with patient does wish to return to [facility].  [facility] Tursi will check benefits and call back. TCF-[facility]y [facility] pt may return to [facility] but will have to pay a co-pay of 2576.00 Will let patient be aware of this. Attempt to inform patient of the co-pay was alseep will speak with him in the am. Expected Discharge Plan: Skilled Nursing Facility Barriers to Discharge: Continued Medical Work up   Patient Goals and CMS Choice Patient states their goals for this hospitalization and ongoing recovery are:: to go home again CMS Medicare.gov Compare Post Acute Care list provided to:: Patient    Expected Discharge Plan and Services Expected Discharge Plan: [place]   Discharge Planning Services: CM Consult Post Acute Care Choice: [place] Living arrangements for the past 2 months: Apartment                                      Prior Living Arrangements/Services Living arrangements for the past 2 months: Apartment Lives with:: Self Patient language and need for interpreter reviewed:: Yes Do you feel safe going back to the place where you live?: Yes      Need for Family Participation in Patient Care: Yes (Comment) Care giver support system in place?: No (comment)   Criminal Activity/Legal Involvement Pertinent to Current Situation/Hospitalization: No - Comment as needed  Activities of Daily Living      Permission Sought/Granted                  Emotional Assessment Appearance:: Appears stated age     Orientation:  : Oriented to Self, Oriented to Place, Oriented to Situation Alcohol / Substance Use: Alcohol Use Psych Involvement: No (comment)  Admission diagnosis:  DKA (diabetic ketoacidoses) ([place]) [E11.10] Diabetic ketoacidosis without coma associated with type 2 diabetes mellitus ([place]) [E11.10] Patient Active Problem List   Diagnosis Date Noted  . DKA (diabetic ketoacidoses) ([place]) 04/14/2020  . PVD (peripheral vascular disease) ([place]) 02/02/2020  . Poor social situation 12/08/2019  . CKD (chronic kidney disease) stage 4, GFR 15-29 ml/min ([name]) 12/08/2019  . Cognitive decline 11/22/2019  . ETOH abuse 05/17/2019  . Chronic diastolic CHF (congestive heart failure) ([place]) 05/17/2019  . Uncontrolled type 2 diabetes mellitus with hyperglycemia, with long-term current use of insulin ([place]) 10/03/2018  . Underweight 11/18/2017  . Anemia 11/18/2017  . DKA, type 2 ([place]) 08/01/2017  . History of cerebrovascular accident (CVA) in adulthood 07/25/2017  . Depression with anxiety 07/25/2017  . Left ventricular diastolic dysfunction 77/82/4235  . Dementia ([place]) 07/25/2017  . 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 Type 2 diabetes mellitus with hyperlipidemia ([place]) 06/27/2017  . Hypertension 06/27/2017  . Hyperkalemia 01/16/2017  . Acute metabolic encephalopathy 36/14/4315  . Weakness 11/16/2016  . Acute kidney injury superimposed on chronic kidney disease ([place])   . Noncompliance with medication regimen 08/17/2016  . History of DVT (deep vein  thrombosis) 07/06/2016  . Vitamin B 12 deficiency 06/18/2016  . Hyponatremia 05/15/2016  . Anxiety 05/15/2016  . Narcotic dependency, continuous ([place]) 11/02/2015  . Uncontrolled type 2 diabetes mellitus with diabetic nephropathy, with long-term current use of insulin ([place]) 08/12/2015   PCP:  Libby Maw, MD Pharmacy:   [facility], [place] [address] Phone:  [phone] Fax: [phone]     Social Determinants of Health (SDOH) Interventions    Readmission Risk Interventions No flowsheet data found.

## 2020-04-16 NOTE — Progress Notes (Addendum)
Palliative Care Consult received  Chart reviewed.  [name] [name] was recently seen by our team and was not interesting in discussion of advance directives at that time.  He was recently transitioned from skilled facility to more independent living and this resulted in quick readmission to the hospital.  Most of the concern regarding his long term care plan seems to on safe disposition.    Our team is happy to help [name] [name] in any way possible, but I would like to coordinate with [facility] team as well.  Will therefore plan to see him tomorrow after I can reach out to [facility] team for input prior to my visit as I think having a clearer picture of options in mind during conversation will be helpful.  Please call for any palliative care specific needs in the interim.  [name], MD [place] [facility] Palliative Medicine Team [phone]    NO CHARGE NOTE

## 2020-04-16 NOTE — Progress Notes (Signed)
Inpatient Diabetes Program Recommendations  AACE/ADA: New Consensus Statement on Inpatient Glycemic Control (2015)  Target Ranges:  Prepandial:   less than 140 mg/dL      Peak postprandial:   less than 180 mg/dL (1-2 hours)      Critically ill patients:  140 - 180 mg/dL   Lab Results  Component Value Date   GLUCAP 279 (H) 04/16/2020   HGBA1C 10.3 (H) 03/24/2020    Review of Glycemic Control  Diabetes history: DM2 Outpatient Diabetes medications: Lantus 6 units QD, Humalog 1-7 units tid Current orders for Inpatient glycemic control: Lantus 4 units QD, Novolog 0-9 units tidwc and hs   Diabetes Coordinator spoke with pt's sister during last admission (3 weeks ago) and sister states she goes to pt's house 2x/day to give insulin. Coordinator also spoke to sister and stated pt was drinking regular [place] and juice. Also drinks juice and regular soda for hypoglycemia. Pt was d/ced to [facility] after last admission.   Inpatient Diabetes Program Recommendations:     Increase Lantus to 6 units QD Add Novolog 2 units tidwc for meal coverage insulin if pt eats > 50% meal.  Continue to titrate until FBS and post-prandials < 180 mg/dL.  Thank you. Lorenda Peck, RD, LDN, CDE Inpatient Diabetes Coordinator [phone]

## 2020-04-17 ENCOUNTER — Inpatient Hospital Stay: Payer: Medicare Other | Admitting: Family Medicine

## 2020-04-17 DIAGNOSIS — R600 Localized edema: Secondary | ICD-10-CM

## 2020-04-17 LAB — COMPREHENSIVE METABOLIC PANEL
ALT: 16 U/L (ref 0–44)
AST: 17 U/L (ref 15–41)
Albumin: 2.4 g/dL — ABNORMAL LOW (ref 3.5–5.0)
Alkaline Phosphatase: 74 U/L (ref 38–126)
Anion gap: 11 (ref 5–15)
BUN: 51 mg/dL — ABNORMAL HIGH (ref 8–23)
CO2: 17 mmol/L — ABNORMAL LOW (ref 22–32)
Calcium: 7.8 mg/dL — ABNORMAL LOW (ref 8.9–10.3)
Chloride: 110 mmol/L (ref 98–111)
Creatinine, Ser: 4.28 mg/dL — ABNORMAL HIGH (ref 0.61–1.24)
GFR calc Af Amer: 15 mL/min — ABNORMAL LOW (ref >60)
GFR calc non Af Amer: 13 mL/min — ABNORMAL LOW (ref >60)
Glucose, Bld: 260 mg/dL — ABNORMAL HIGH (ref 70–99)
Potassium: 4.6 mmol/L (ref 3.5–5.1)
Sodium: 138 mmol/L (ref 135–145)
Total Bilirubin: 0.5 mg/dL (ref 0.3–1.2)
Total Protein: 5.5 g/dL — ABNORMAL LOW (ref 6.5–8.1)

## 2020-04-17 LAB — CBC WITH DIFFERENTIAL/PLATELET
Abs Immature Granulocytes: 0.03 10*3/uL (ref 0.00–0.07)
Basophils Absolute: 0 10*3/uL (ref 0.0–0.1)
Basophils Relative: 1 %
Eosinophils Absolute: 0.5 10*3/uL (ref 0.0–0.5)
Eosinophils Relative: 8 %
HCT: 27.4 % — ABNORMAL LOW (ref 39.0–52.0)
Hemoglobin: 8.6 g/dL — ABNORMAL LOW (ref 13.0–17.0)
Immature Granulocytes: 1 %
Lymphocytes Relative: 29 %
Lymphs Abs: 1.7 10*3/uL (ref 0.7–4.0)
MCH: 29.1 pg (ref 26.0–34.0)
MCHC: 31.4 g/dL (ref 30.0–36.0)
MCV: 92.6 fL (ref 80.0–100.0)
Monocytes Absolute: 0.6 10*3/uL (ref 0.1–1.0)
Monocytes Relative: 11 %
Neutro Abs: 3.1 10*3/uL (ref 1.7–7.7)
Neutrophils Relative %: 50 %
Platelets: 202 10*3/uL (ref 150–400)
RBC: 2.96 MIL/uL — ABNORMAL LOW (ref 4.22–5.81)
RDW: 14 % (ref 11.5–15.5)
WBC: 6 10*3/uL (ref 4.0–10.5)
nRBC: 0 % (ref 0.0–0.2)

## 2020-04-17 LAB — RETICULOCYTES
Immature Retic Fract: 7 % (ref 2.3–15.9)
RBC.: 2.93 MIL/uL — ABNORMAL LOW (ref 4.22–5.81)
Retic Count, Absolute: 26.7 10*3/uL (ref 19.0–186.0)
Retic Ct Pct: 0.9 % (ref 0.4–3.1)

## 2020-04-17 LAB — FERRITIN: Ferritin: 170 ng/mL (ref 24–336)

## 2020-04-17 LAB — PHOSPHORUS: Phosphorus: 4.1 mg/dL (ref 2.5–4.6)

## 2020-04-17 LAB — FOLATE: Folate: 19.1 ng/mL (ref >5.9)

## 2020-04-17 LAB — IRON AND TIBC
Iron: 68 ug/dL (ref 45–182)
Saturation Ratios: 31 % (ref 17.9–39.5)
TIBC: 216 ug/dL — ABNORMAL LOW (ref 250–450)
UIBC: 148 ug/dL

## 2020-04-17 LAB — GLUCOSE, CAPILLARY
Glucose-Capillary: 300 mg/dL — ABNORMAL HIGH (ref 70–99)
Glucose-Capillary: 206 mg/dL — ABNORMAL HIGH (ref 70–99)
Glucose-Capillary: 128 mg/dL — ABNORMAL HIGH (ref 70–99)
Glucose-Capillary: 90 mg/dL (ref 70–99)

## 2020-04-17 LAB — VITAMIN B12: Vitamin B-12: 1813 pg/mL — ABNORMAL HIGH (ref 180–914)

## 2020-04-17 LAB — MAGNESIUM: Magnesium: 2.2 mg/dL (ref 1.7–2.4)

## 2020-04-17 MED ORDER — SODIUM BICARBONATE 650 MG PO TABS
650.0000 mg | ORAL_TABLET | Freq: Two times a day (BID) | ORAL | Status: DC
  Administered 2020-04-17 – 2020-04-20 (×7): 650 mg via ORAL
  Filled 2020-04-17 (×7): qty 1

## 2020-04-17 NOTE — TOC Progression Note (Addendum)
Transition of Care [name]) - Progression Note    Patient Details  Name: Erskin Zinda MRN: [id number] Date of Birth: 09/19/1946  [facility] CM/SW Contact  Leeroy Cha, RN Phone Number: 04/17/2020, 3:31 PM  Clinical Narrative:    Patient made aware of the co-pay for snf of 2576.00 states he has the money but has lost his card.  That his sister faye may help. TCT-Faye Keenum-sister-will call [facility] and see what arrangement can be made and will call the bank to get patient a new card.  Does not have checks with his account. tcf-Faye-pt does not have that much money in his account.  She has called [facility] and is waiting on a call back but would prefer for him to go elsewhere. TCT-tammy blankley-[employer] will start auth and call in the am. Sister has approved for patient to go [place], [place], [place], and [place].   Does not want brother to go to [place].  Expected Discharge Plan: [place] Barriers to Discharge: Continued Medical Work up  Expected Discharge Plan and Services Expected Discharge Plan: [place]   Discharge Planning Services: CM Consult Post Acute Care Choice: [place] Living arrangements for the past 2 months: Apartment                                       Social Determinants of Health (SDOH) Interventions    Readmission Risk Interventions No flowsheet data found.

## 2020-04-17 NOTE — Progress Notes (Signed)
PROGRESS NOTE    [name] [name]  IWP:[id number] DOB: [birth year] DOA: 04/14/2020 PCP: Libby Maw, MD   Brief Narrative:  HPI per Dr. Darron Doom on 04/14/20  [name] [name] is a 74 y.o. male with medical history significant of type 2 diabetes, history of CVA with mild cognitive impairment, hypertension, chronic kidney disease stage IV who was recently admitted from 6/12-6/17 with acute encephalopathy related to hyperosmolar hyperglycemia.  At that time his Lantus was reduced to 6 units/day with sliding scale for meal coverage due to ongoing hypoglycemic events.  At that admission he was discharged to a skilled nursing facility due to a complicated social situation.  He has ongoing cognitive impairment and is being cared for by 2 sisters who are also responsible for caring for his 64 year old mother and a brother with Down syndrome.  Apparently he had fallen at the nursing home and was just not acting like himself.  According to the nursing home he had hyperglycemia for the past 2 days.  History was obtained by reading the chart in the ED note as the patient is unable to give correct history at this time  ED Course: In the emergency department he was found to be confused and hyperventilating.  Labs revealed anion gap acidemia consistent with DKA.  He was also noted to be hyperglycemic with a blood sugar in the 1197 range.  His bicarb was 7 the anion gap was 29.  At that time his potassium was 7.4.  He was noted to be anemic with a hemoglobin of 8.0 in the setting of known vitamin B12 deficiency.  We are asked to admit for correction of his ongoing DKA.  **Interim History He is complaining of some right leg pain but he is more awake and wanting food.  Transition off the long-acting insulin and is tolerating his diet.  He is improving steadily but still has a slight metabolic acidosis.  Insulin has been adjusted.  PT OT recommending SNF.  Transition of care assistance with placement.  He had some  hand edema due to IV infiltration and we have remove the IV and asked the patient to elevate his extremity.  Prior saw the patient and they want continued aggressive interventions with the hope that he can stabilize enough to recover his function to be transferred back to independent living facility.  Likely patient will be transitioned to rehab prior to being going home and palliative care recommending outpatient palliative care to follow if he does end up going to another facility.  Assessment & Plan:   Principal Problem:   DKA (diabetic ketoacidoses) ([facility]) Active Problems:   Uncontrolled type 2 diabetes mellitus with diabetic nephropathy, with long-term current use of insulin ([name])   Vitamin B 12 deficiency   History of DVT (deep vein thrombosis)   Acute kidney injury superimposed on chronic kidney disease ([name])   Weakness   Acute metabolic encephalopathy   Hyperkalemia   Type 2 diabetes mellitus with hyperlipidemia ([name])   Hypertension   History of cerebrovascular accident (CVA) in adulthood   DKA, type 2 ([facility])   Anemia   Uncontrolled type 2 diabetes mellitus with hyperglycemia, with long-term current use of insulin ([name])   PVD (peripheral vascular disease) ([facility] [place])  DKA, improved Pseudohyponatremia in the setting of his hyperglycemia -Suspect falls and altered mental status is all related to DKA. -Unclear why he went into DKA but could be secondary to noncompliance -Make sure to rule out infection so will obtain a urinalysis; head CT  was negative, chest x-ray showed no active disease -IV fluid bolus complete and he is given a lactated Ringer's 1.374 L as well as a 1 L normal saline bolus -Given Calcium Gluconate 1,000 mg -Started on Endo tool with insulin GTT and was transitioned to Long Acting Insulin; see below -Was N.p.o. but diet was started with Carb Modified Diet  -Trended Serial labs with correction of electrolyte abnormalities as needed and his DKA has improved -BMP done  earlier this morning showed a CO2 of 19, anion gap of 8, chloride level 108 -His beta hydroxybutyrate acid was 7.12 on admission is now 0.14 -Sodium on admission was 129 and after IV fluid resuscitation and correction his glucose is now 138 -Continue to monitor and trend and repeat CMP in a.m.  -He is currently getting D5 half-normal saline at 75 MLS per hour but will stop this and start normal saline at 75 mL's per hour given his already been transitioned to long-acting insulin and because he is eating -See below  Poorly controlled diabetes with diabetic nephropathy and long-term use of insulin -He wasOn Endo tool for now and transitioned to Long Acting Insulin -Given this is a second admission for similar problems will need to titrate his insulin very carefully to ensure he does not represent for another admission -He is on Lantus 4 units subcu daily as well as sensitive NovoLog/scale insulin before meals and at bedtime; we will increase the Lantus to 6 units subcu daily and also add 2 units of NovoLog 3 times daily with meals if he eats greater than 50% of his meals -CBGs ranging from 1 78-300 -Recent hemoglobin A1c was 10.3 -We will consult diabetes education coordinator for further evaluation recommendations -Care consulted given his frequent hospitalizations and will try and address goals of care and family wants full aggressive care; patient may end up going to SNF and if this is the case palliative care can follow at the SNF  Vitamin D Deficiency -Holding his vitamin D for now will need resumption at discharge  History of DVT -No evidence of DVT at present, on Lovenox  30 mg for DVT prophylaxis  Acute Kidney Injury on chronic kidney disease Stage IV Metabolic Acidosis -Likely related to dehydration and DKA,  -Baseline creatinine is in the 2-3 range was 3.2 at last discharge. -Patient presented with a BUN/creatinine of 69/5.23 and is now 51/4.28 -Has a mild metabolic acidosis  with a CO2 of 17, anion gap of 11, chloride level 110 -We will add sodium bicarbonate tabs 650 mg p.o. twice daily -Continue hydration but will change to normal saline at 75 MLS per hour for today and reevaluate in the morning if he needs to continue to have IV fluid hydration -Avoid nephrotoxic medications, contrast dyes, hypotension and renally dose medications -Continue monitor and trend renal function repeat CMP in a.m.  Generalized Weakness and Fall -Likely related to DKA and Hyperglycemia -Obtain PT/OT Evaluation and they are recommending SNF -Check TSH, B12 was 3509 and CK was 225 -Continue with normal saline at 75 MLS per hour -Received no results-aspirin placed 3 weeks ago and likely can go back but will have a co-pay of $2576 per TOC.  Will need a safe discharge disposition as he likely cannot live by himself anymore given his recurrent hospitalizations and poor medical insight to his disease processes  Acute Metabolic Encephalopathy and is now improved -Likely related to DKA -Head CT done and showed "No acute abnormality. Atrophy and chronic microvascular ischemic change." -Continue  to Monitor and Place on Delirium Precautions -Patient's mentation has much improved  Hyperkalemia, improved  -Correct with insulin IV fluid hydration,  -Will need to monitor electrolytes closely and replete as necessary -Patient's potassium is now 4.6 -Continue to monitor and trend and repeat CMP in a.m.  Hyperlipidemia -Resume Atorvastatin 40 mg po daily   Hypertension -Hypotensive at time of admission but now improved -Resumed Amlodipine 10 mg po Daily and Hydralazine 50 mg po TID -Also will Discontinue Lopressor 5 mg PRN and start Hydralazine 10 mg IV q6hprn for SBP>170 or DBP>90 -Continue to Monitor BP per Protocol   History of CVA -Continues to have Ongoing cognitive delays -Delirium Precautions -PT/OT to evaluate and Treat and they are recommending SNF -Resume Home Atorvastatin   Microcytic/Normocytic Anemia likely in the setting of chronic kidney disease -Hemoglobin is 8.0 on admission; repeat hemoglobin/hematocrit is now 8.6/27.4 -Will transfuse if less than 7.   -B12 was 3,509 -Checked Anemia Panel and it showed an iron level of 68, U IBC of 148, TIBC of 216, saturation ratio 31%, ferritin level 170, folate of 19.1, and vitamin B12 level of 1813 -Continue to monitor for signs and symptoms of bleeding; currently no overt bleeding noted -Repeat CBC in a.m.  Right Leg/Shin Pain -DG Tib/Fib showed "Mild diffuse decreased bone mineralization. No evidence of acute fracture or dislocation." -Pain is better today   Peripheral vascular disease -Resume Home Atorvastatin   Leukocytosis -Mild and in the setting of DKA -WBC wernt from 11.5 -> 11.7 -> 9.1 -> 6.0 -Continue to monitor for signs and symptoms of infection -Repeat CBC in a.m.  DVT prophylaxis: Enoxaparin 30 mg sq q24h Code Status: FULL CODE Family Communication: No family present at bedside  Disposition Plan: Pending further improvement and evaluation by PT and OT and they are recommending SNF; TSE assisting with trying to find SNF for safe discharge disposition; currently he is stable to be transferred to the medical floor  Status is: Inpatient  Remains inpatient appropriate because:Unsafe d/c plan, IV treatments appropriate due to intensity of illness or inability to take PO and Inpatient level of care appropriate due to severity of illness   Dispo: The patient is from: Home              Anticipated d/c is to: SNF              Anticipated d/c date is: 2 days              Patient currently is not medically stable to d/c.  Consultants:   Palliative Care   Procedures:  None  Antimicrobials:  Anti-infectives (From admission, onward)   None     Subjective: Seen and examined at bedside and he states that he is feeling better.  No complaints of leg pain.  States his hand got swollen but  states his little bit better and likely from IV.  I told him to elevate his hand and he is agreeable.  No chest pain lightheadedness or dizziness.  Still feels weak.  No other concerns or complaints at this time.  Objective: Vitals:   04/17/20 0605 04/17/20 0800 04/17/20 1000 04/17/20 1200  BP: (!) 183/84 (!) 168/83 (!) 144/80 127/78  Pulse:  78 88 76  Resp:  20 19 14   Temp:  98.5 F (36.9 C)  98.5 F (36.9 C)  TempSrc:  Oral  Oral  SpO2:  100% 100% 99%  Weight:        Intake/Output Summary (Last  24 hours) at 04/17/2020 1336 Last data filed at 04/17/2020 1200 Gross per 24 hour  Intake 1535.76 ml  Output 2275 ml  Net -739.24 ml   Filed Weights   04/14/20 1319  Weight: 68.7 kg   Examination: Physical Exam:  Constitutional: Patient is a thin chronically ill-appearing male who is slightly disheveled currently no acute distress appears calm and comfortable in the bed. Eyes: Lids and conjunctivae normal, sclerae anicteric  ENMT: External Ears, Nose appear normal. Grossly normal hearing. .  Neck: Appears normal, supple, no cervical masses, normal ROM, no appreciable thyromegaly; no JVD Respiratory: Diminished to auscultation bilaterally, no wheezing, rales, rhonchi or crackles. Normal respiratory effort and patient is not tachypenic. No accessory muscle use.  Unlabored breathing Cardiovascular: RRR, no murmurs / rubs / gallops. S1 and S2 auscultated.  Right hand is extremely swollen in setting of IV infiltration Abdomen: Soft, non-tender, non-distended. Bowel sounds positive.  GU: Deferred. Musculoskeletal: No clubbing / cyanosis of digits/nails. No joint deformity upper and lower extremities.  Skin: No rashes, lesions, ulcers on limited skin evaluation does have a right leg bandage.. No induration; Warm and dry.  Neurologic: CN 2-12 grossly intact with no focal deficits. Romberg sign and cerebellar reflexes not assessed.  Psychiatric: Slightly impaired judgment and insight. Alert and  oriented x 3. Normal mood and appropriate affect.   Data Reviewed: I have personally reviewed following labs and imaging studies  CBC: Recent Labs  Lab 04/14/20 1315 04/15/20 0823 04/16/20 0327 04/17/20 0306  WBC 11.5* 11.7* 9.1 6.0  NEUTROABS 9.4* 7.8* 5.1 3.1  HGB 8.0* 8.4* 8.3* 8.6*  HCT 30.7* 25.9* 26.8* 27.4*  MCV 114.1* 90.9 94.7 92.6  PLT 284 243 205 373   Basic Metabolic Panel: Recent Labs  Lab 04/14/20 2219 04/15/20 0347 04/15/20 0707 04/15/20 0823 04/16/20 0327 04/17/20 0306  NA 136 138 138  --  135 138  K 4.7 4.6 4.3  --  4.7 4.6  CL 104 108 108  --  108 110  CO2 15* 20* 19*  --  19* 17*  GLUCOSE 426* 110* 128*  --  196* 260*  BUN 64* 63* 63*  --  60* 51*  CREATININE 4.86* 4.66* 4.49*  --  4.63* 4.28*  CALCIUM 8.6* 8.1* 8.0*  --  7.9* 7.8*  MG  --   --   --  2.3  --  2.2  PHOS  --   --   --  4.6  --  4.1   GFR: Estimated Creatinine Clearance: 13.9 mL/min (A) (by C-G formula based on SCr of 4.28 mg/dL (H)). Liver Function Tests: Recent Labs  Lab 04/15/20 0834 04/17/20 0306  AST 23 17  ALT 15 16  ALKPHOS 67 74  BILITOT 0.5 0.5  PROT 5.7* 5.5*  ALBUMIN 2.7* 2.4*   No results for input(s): LIPASE, AMYLASE in the last 168 hours. No results for input(s): AMMONIA in the last 168 hours. Coagulation Profile: No results for input(s): INR, PROTIME in the last 168 hours. Cardiac Enzymes: Recent Labs  Lab 04/14/20 1542  CKTOTAL 225   BNP (last 3 results) No results for input(s): PROBNP in the last 8760 hours. HbA1C: No results for input(s): HGBA1C in the last 72 hours. CBG: Recent Labs  Lab 04/16/20 1210 04/16/20 1605 04/16/20 2107 04/17/20 0828 04/17/20 1149  GLUCAP 303* 178* 180* 300* 206*   Lipid Profile: No results for input(s): CHOL, HDL, LDLCALC, TRIG, CHOLHDL, LDLDIRECT in the last 72 hours. Thyroid Function Tests: No results  for input(s): TSH, T4TOTAL, FREET4, T3FREE, THYROIDAB in the last 72 hours. Anemia Panel: Recent Labs     04/15/20 0347 04/17/20 0306  VITAMINB12 3,509* 1,813*  FOLATE  --  19.1  FERRITIN  --  170  TIBC  --  216*  IRON  --  68  RETICCTPCT  --  0.9   Sepsis Labs: No results for input(s): PROCALCITON, LATICACIDVEN in the last 168 hours.  Recent Results (from the past 240 hour(s))  SARS Coronavirus 2 by RT PCR (hospital order, performed in [facility] hospital lab) Nasopharyngeal Nasopharyngeal Swab     Status: None   Collection Time: 04/14/20  1:44 PM   Specimen: Nasopharyngeal Swab  Result Value Ref Range Status   SARS Coronavirus 2 NEGATIVE NEGATIVE Final    Comment: (NOTE) SARS-CoV-2 target nucleic acids are NOT DETECTED.  The SARS-CoV-2 RNA is generally detectable in upper and lower respiratory specimens during the acute phase of infection. The lowest concentration of SARS-CoV-2 viral copies this assay can detect is 250 copies / mL. A negative result does not preclude SARS-CoV-2 infection and should not be used as the sole basis for treatment or other patient management decisions.  A negative result may occur with improper specimen collection / handling, submission of specimen other than nasopharyngeal swab, presence of viral mutation(s) within the areas targeted by this assay, and inadequate number of viral copies (<250 copies / mL). A negative result must be combined with clinical observations, patient history, and epidemiological information.  Fact Sheet for Patients:   StrictlyIdeas.no  Fact Sheet for Healthcare Providers: BankingDealers.co.za  This test is not yet approved or  cleared by the [place] FDA and has been authorized for detection and/or diagnosis of SARS-CoV-2 by FDA under an Emergency Use Authorization (EUA).  This EUA will remain in effect (meaning this test can be used) for the duration of the COVID-19 declaration under Section 564(b)(1) of the Act, 21 U.S.C. section 360bbb-3(b)(1), unless the  authorization is terminated or revoked sooner.  Performed at [facility], [place] [address] [place], [place] [address]   MRSA PCR Screening     Status: None   Collection Time: 04/14/20  9:57 PM   Specimen: Nasal Mucosa; Nasopharyngeal  Result Value Ref Range Status   MRSA by PCR NEGATIVE NEGATIVE Final    Comment:        The GeneXpert MRSA Assay (FDA approved for NASAL specimens only), is one component of a comprehensive MRSA colonization surveillance program. It is not intended to diagnose MRSA infection nor to guide or monitor treatment for MRSA infections. Performed at [facility], [place] [address] [place], [place] [address]     RN Pressure Injury Documentation:     Estimated body mass index is 23.72 kg/m as calculated from the following:   Height as of 01/03/20: 5\' 7"  (1.702 m).   Weight as of this encounter: 68.7 kg.  Malnutrition Type:      Malnutrition Characteristics:      Nutrition Interventions:    Radiology Studies: No results found. Scheduled Meds: . amLODipine  10 mg Oral Daily  . atorvastatin  40 mg Oral Daily  . Chlorhexidine Gluconate Cloth  6 each Topical Q0600  . enoxaparin (LOVENOX) injection  30 mg Subcutaneous Q24H  . hydrALAZINE  50 mg Oral TID  . insulin aspart  0-5 Units Subcutaneous QHS  . insulin aspart  0-9 Units Subcutaneous TID WC  . insulin aspart  2 Units Subcutaneous TID WC  .  insulin glargine  6 Units Subcutaneous Daily  . levothyroxine  25 mcg Oral QAC breakfast   Continuous Infusions: . sodium chloride 75 mL/hr at 04/17/20 0525    LOS: 3 days   [name], DO [facility] PAGER is on AMION  If 7PM-7AM, please contact night-coverage [email or web address]

## 2020-04-17 NOTE — Consult Note (Signed)
Consultation Note Date: 04/17/2020   Patient Name: [name] [name]  DOB: October 12, 1946  MRN: [id number]  Age / Sex: 74 y.o., male   PCP: Libby Maw, MD Referring Physician: [name], DO   REASON FOR CONSULTATION:Establishing goals of care   Palliative care consult received.  Chart reviewed including personal review of pertinent labs and imaging.  Discussed with his bedside care team.  Briefly, [name] [name] is a 74 year old male with past medical history including CKD 4, DVT, CVA, PVD, diabetes, cognitive decline, alcohol abuse, diastolic heart failure, hypertension, falls and dementia who presented to the ED from home following recent transition from skilled facility back to independent living.  He is being treated for DKA.  Palliative consulted for goals of care.  Clinical Assessment and Goals of Care: [name] [name] is well-known to our team from previous admission where he consulted with my partner, Alda Lea.  Please see her note for further information regarding his social history and family situation.  Basically, he lives alone and has 2 sisters who try to help care for him, but they are also working to care for a family member with Down syndrome and their 61 year old mother.  [name] [name] is awake and alert.  He is able to answer questions appropriately, but he remains resistant to any discussion regarding, "negativity."  He also asked me to call his sister.  I called and was able to reach his sisters, [name] [name] and [name] [name].  We reviewed again hospice and palliative care services and both [name] [name] and his sisters are not at a point where they are really interested in consideration for anything less than aggressive care plan.  They are open to consideration for outpatient palliative care services once he is discharged.  For short-term, [name] [name] and his sisters would be in agreement with placement for rehab if this can be arranged.  I called to discuss with transition of care  team, however, with this being a holiday, information regarding his eligibility days is not available today.  Appreciate transition of care continuing to work on disposition.  SOCIAL HISTORY:     reports that he quit smoking about 4 years ago. His smoking use included cigarettes. He has a 7.50 pack-year smoking history. He has never used smokeless tobacco. He reports that he does not drink alcohol and does not use drugs.  CODE STATUS: Full code  ADVANCE DIRECTIVES: Per [name] [name], his sisters [name] [name] and [name] [name]) would be his desires surrogate decision makers.  There is a designated party release naming his sisters, but no other advance care planning paperwork.  I advised family that, legally speaking, his son and 58 year old mother would be his surrogate decision makers.  I encourage them to complete healthcare power of attorney paperwork naming his sisters as his surrogate as this is a stated desire.  He declined to do this today.   SYMPTOM MANAGEMENT: per attending  Palliative Prophylaxis:   Delirium Protocol  PSYCHO-SOCIAL/SPIRITUAL:  Support System: Family  Desire for further Chaplaincy support: NO   Additional Recommendations (Limitations, Scope, Preferences):  Full Scope Treatment   PAST MEDICAL HISTORY: Past Medical History:  Diagnosis Date  . 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 Closed comminuted intertrochanteric fracture of left femur ([place])   . Diabetes mellitus without complication ([place])   . Diabetic hyperosmolar non-ketotic state ([place]) 05/15/2016  . DKA (diabetic ketoacidoses) ([place]) 05/09/2016  . Hypertension   . Hypoglycemia 11/29/2019  .  Postoperative anemia due to acute blood loss 06/18/2016  . Retroperitoneal hematoma 06/18/2016  . Stroke ([place])   . Vitamin B 12 deficiency 06/18/2016    PAST SURGICAL HISTORY:  Past Surgical History:  Procedure Laterality Date  . HIP ARTHROPLASTY Right 02/05/2013   Procedure:  ARTHROPLASTY BIPOLAR HIP;  Surgeon: [name], MD;  Location: [facility];  Service: Orthopedics;  Laterality: Right;  . INTRAMEDULLARY (IM) NAIL INTERTROCHANTERIC Left 06/16/2016   Procedure: INTRAMEDULLARY (IM) NAIL INTERTROCHANTRIC;  Surgeon: [name] Can, MD;  Location: [facility];  Service: Orthopedics;  Laterality: Left;    ALLERGIES:  has No Known Allergies.   MEDICATIONS:  Current Facility-Administered Medications  Medication Dose Route Frequency Provider Last Rate Last Admin  . 0.9 %  sodium chloride infusion   Intravenous Continuous Raiford Noble [place], [place] 75 mL/hr at 04/17/20 0525 New Bag/Given (Non-Interop) at 04/17/20 0525  . amLODipine (NORVASC) tablet 10 mg  10 mg Oral Daily Raiford Noble [place], DO   10 mg at 04/17/20 9450  . atorvastatin (LIPITOR) tablet 40 mg  40 mg Oral Daily Raiford Noble [place], DO   40 mg at 04/17/20 3888  . Chlorhexidine Gluconate Cloth 2 % PADS 6 each  6 each Topical Q0600 Donnamae Jude, MD   6 each at 04/16/20 2230  . dextrose 50 % solution 0-50 mL  0-50 mL Intravenous PRN Donnamae Jude, MD      . enoxaparin (LOVENOX) injection 30 mg  30 mg Subcutaneous Q24H Donnamae Jude, MD   30 mg at 04/16/20 2126  . hydrALAZINE (APRESOLINE) injection 10 mg  10 mg Intravenous Q6H PRN Raiford Noble [place], DO   10 mg at 04/17/20 2800  . hydrALAZINE (APRESOLINE) tablet 50 mg  50 mg Oral TID Raiford Noble [place], DO   50 mg at 04/17/20 0954  . HYDROmorphone (DILAUDID) injection 0.5-1 mg  0.5-1 mg Intravenous Q2H PRN Donnamae Jude, MD      . insulin aspart (novoLOG) injection 0-5 Units  0-5 Units Subcutaneous QHS Opyd, Timothy S, MD      . insulin aspart (novoLOG) injection 0-9 Units  0-9 Units Subcutaneous TID WC Opyd, Ilene Qua, MD   5 Units at 04/17/20 0847  . insulin aspart (novoLOG) injection 2 Units  2 Units Subcutaneous TID WC Raiford Noble [place], DO   2 Units at 04/17/20 0847  . insulin glargine (LANTUS) injection 6 Units  6 Units Subcutaneous Daily Raiford Noble  [place], [place]   6 Units at 04/17/20 3491  . levothyroxine (SYNTHROID) tablet 25 mcg  25 mcg Oral QAC breakfast Raiford Noble [place], DO   25 mcg at 04/17/20 0522  . oxyCODONE (Oxy IR/ROXICODONE) immediate release tablet 5 mg  5 mg Oral Q4H PRN Raiford Noble [place], DO   5 mg at 04/16/20 7915  . polyethylene glycol (MIRALAX / GLYCOLAX) packet 17 g  17 g Oral Daily PRN Donnamae Jude, MD        VITAL SIGNS: BP (!) 183/84   Pulse 79   Temp 98.5 F (36.9 C) (Oral)   Resp 12   Wt 68.7 kg   SpO2 100%   BMI 23.72 kg/m  Filed Weights   04/14/20 1319  Weight: 68.7 kg    Estimated body mass index is 23.72 kg/m as calculated from the following:   Height as of 01/03/20: 5\' 7"  (1.702 m).   Weight as of this encounter: 68.7 kg.  LABS: CBC:    Component Value Date/Time  WBC 6.0 04/17/2020 0306   HGB 8.6 (L) 04/17/2020 0306   HGB 10.6 (L) 03/16/2019 1021   HCT 27.4 (L) 04/17/2020 0306   HCT 33.1 (L) 03/16/2019 1021   PLT 202 04/17/2020 0306   PLT 121 (L) 03/16/2019 1021   Comprehensive Metabolic Panel:    Component Value Date/Time   NA 138 04/17/2020 0306   NA 131 (L) 03/16/2019 1021   K 4.6 04/17/2020 0306   CO2 17 (L) 04/17/2020 0306   BUN 51 (H) 04/17/2020 0306   BUN 27 03/16/2019 1021   CREATININE 4.28 (H) 04/17/2020 0306   ALBUMIN 2.4 (L) 04/17/2020 0306   ALBUMIN 4.3 03/16/2019 1021   Review of Systems  Neurological: Positive for weakness.  Otherwise, review of systems negative, however, it appears evident to me that he does minimize his symptoms.  Physical Exam General: NAD, alert and oriented  Cardiovascular: regular rate and rhythm Pulmonary: clear ant fields Abdomen: soft, nontender, + bowel sounds Extremities: no edema, no joint deformities Skin: no rashes, warm and dry Neurological: answers most questions appropriately, follows commands   Prognosis: Guarded   Discharge Planning:  To Be Determined-? back to skilled facility for rehab  Recommendations:  Full  Code/Full scope   Continue current plan of care per medical team  As per last palliative medicine encounter, Mr. Jozwiak and his sisters desire to continue with aggressive interventions.  They remain hopeful that he will have enough stabilization and recovery of his function to be able to eventually transposition back to living independently.  His sisters will continue to try and support him as much as possible, however, they are also caring for another family member with Down syndrome as well as their 10 year old mother.  He is not at a point to consider hospice services.  He "may" be willing to consider palliative care support, but this would really be very limited additional support compared to the care needs that he has.  Family expresses concerns about how well he is going to be able to fare on his own once he leaves the hospital.  His sister reports that he needs very clear directions on how to manage his diabetes.  She does not feel was being adequately managed at the skilled facility when he left the hospital recently and this was precursor to his need to return to the hospital.  I reached out to touch base with the diabetic coordinator here to ensure they are aware of his outpatient situation.  While our diabetic coordinator will certainly do as much as possible to outline his care plan, there is unfortunately little influence that the hospital has on how well his diabetes is managed at outside facilities.  He and his sisters are hopeful he can transition to skilled facility for continued rehab.  Discussed with case management today and await information regarding his benefits and how many rehab days he has left this period.  As this is more of a question of disposition and patient and family are not interested in consideration for anything less than a full aggressive care plan, palliative care will not follow moving forward.  He may benefit from outpatient palliative care following if he can be  arranged to whatever facility/venue he ends up transitioning to.  If there are other palliative specific needs this admission, please call or reconsult.  Palliative Performance Scale: PPS 30%              Patient and sisters, [name] [name] and [name] [name] expressed understanding and was  in agreement with this plan.   Thank you for allowing the Palliative Medicine Team to assist in the care of this patient.  Start time: 1530 End Time: 1630 Total time: 60 minutes   Greater than 50%  of this time was spent counseling and coordinating care related to the above assessment and plan.  Signed by: [name], MD [place] Team [phone]

## 2020-04-17 NOTE — Progress Notes (Addendum)
Pt R hand noted to be +3 edematous.This RN removed the IV in that hand  & hand elevated. Pt denies any pain. On call triad MD notified. RN will continue to monitor.

## 2020-04-18 DIAGNOSIS — N184 Chronic kidney disease, stage 4 (severe): Secondary | ICD-10-CM

## 2020-04-18 DIAGNOSIS — E1169 Type 2 diabetes mellitus with other specified complication: Secondary | ICD-10-CM

## 2020-04-18 DIAGNOSIS — N179 Acute kidney failure, unspecified: Secondary | ICD-10-CM

## 2020-04-18 DIAGNOSIS — E785 Hyperlipidemia, unspecified: Secondary | ICD-10-CM

## 2020-04-18 DIAGNOSIS — G9341 Metabolic encephalopathy: Secondary | ICD-10-CM

## 2020-04-18 LAB — COMPREHENSIVE METABOLIC PANEL
ALT: 14 U/L (ref 0–44)
AST: 14 U/L — ABNORMAL LOW (ref 15–41)
Albumin: 2.4 g/dL — ABNORMAL LOW (ref 3.5–5.0)
Alkaline Phosphatase: 65 U/L (ref 38–126)
Anion gap: 6 (ref 5–15)
BUN: 40 mg/dL — ABNORMAL HIGH (ref 8–23)
CO2: 18 mmol/L — ABNORMAL LOW (ref 22–32)
Calcium: 7.9 mg/dL — ABNORMAL LOW (ref 8.9–10.3)
Chloride: 114 mmol/L — ABNORMAL HIGH (ref 98–111)
Creatinine, Ser: 3.35 mg/dL — ABNORMAL HIGH (ref 0.61–1.24)
GFR calc Af Amer: 20 mL/min — ABNORMAL LOW (ref >60)
GFR calc non Af Amer: 17 mL/min — ABNORMAL LOW (ref >60)
Glucose, Bld: 159 mg/dL — ABNORMAL HIGH (ref 70–99)
Potassium: 4.2 mmol/L (ref 3.5–5.1)
Sodium: 138 mmol/L (ref 135–145)
Total Bilirubin: 0.5 mg/dL (ref 0.3–1.2)
Total Protein: 5.3 g/dL — ABNORMAL LOW (ref 6.5–8.1)

## 2020-04-18 LAB — CBC WITH DIFFERENTIAL/PLATELET
Abs Immature Granulocytes: 0.02 10*3/uL (ref 0.00–0.07)
Basophils Absolute: 0 10*3/uL (ref 0.0–0.1)
Basophils Relative: 1 %
Eosinophils Absolute: 0.4 10*3/uL (ref 0.0–0.5)
Eosinophils Relative: 8 %
HCT: 26.9 % — ABNORMAL LOW (ref 39.0–52.0)
Hemoglobin: 8.3 g/dL — ABNORMAL LOW (ref 13.0–17.0)
Immature Granulocytes: 0 %
Lymphocytes Relative: 39 %
Lymphs Abs: 2.1 10*3/uL (ref 0.7–4.0)
MCH: 28.9 pg (ref 26.0–34.0)
MCHC: 30.9 g/dL (ref 30.0–36.0)
MCV: 93.7 fL (ref 80.0–100.0)
Monocytes Absolute: 0.6 10*3/uL (ref 0.1–1.0)
Monocytes Relative: 10 %
Neutro Abs: 2.2 10*3/uL (ref 1.7–7.7)
Neutrophils Relative %: 42 %
Platelets: 189 10*3/uL (ref 150–400)
RBC: 2.87 MIL/uL — ABNORMAL LOW (ref 4.22–5.81)
RDW: 14.1 % (ref 11.5–15.5)
WBC: 5.4 10*3/uL (ref 4.0–10.5)
nRBC: 0 % (ref 0.0–0.2)

## 2020-04-18 LAB — PHOSPHORUS: Phosphorus: 4.2 mg/dL (ref 2.5–4.6)

## 2020-04-18 LAB — GLUCOSE, CAPILLARY
Glucose-Capillary: 221 mg/dL — ABNORMAL HIGH (ref 70–99)
Glucose-Capillary: 190 mg/dL — ABNORMAL HIGH (ref 70–99)
Glucose-Capillary: 83 mg/dL (ref 70–99)
Glucose-Capillary: 142 mg/dL — ABNORMAL HIGH (ref 70–99)

## 2020-04-18 LAB — MAGNESIUM: Magnesium: 1.9 mg/dL (ref 1.7–2.4)

## 2020-04-18 MED ORDER — HYDRALAZINE HCL 25 MG PO TABS: 25.0000 mg | ORAL_TABLET | ORAL | Status: DC | PRN

## 2020-04-18 MED ORDER — HYDRALAZINE HCL 50 MG PO TABS
75.0000 mg | ORAL_TABLET | Freq: Three times a day (TID) | ORAL | Status: DC
  Administered 2020-04-18 (×2): 75 mg via ORAL
  Filled 2020-04-18 (×2): qty 1

## 2020-04-18 MED ORDER — HYDRALAZINE HCL 25 MG PO TABS
25.0000 mg | ORAL_TABLET | Freq: Once | ORAL | Status: AC
  Administered 2020-04-18: 25 mg via ORAL
  Filled 2020-04-18: qty 1

## 2020-04-18 MED ORDER — LABETALOL HCL 5 MG/ML IV SOLN: 10.0000 mg | INTRAVENOUS | Status: DC | PRN

## 2020-04-18 NOTE — Assessment & Plan Note (Signed)
-  A1c 10.3% in June 2021 -DKA has now resolved -Continue on Lantus and SSI

## 2020-04-18 NOTE — Assessment & Plan Note (Signed)
-   resolved with treatment

## 2020-04-18 NOTE — Assessment & Plan Note (Signed)
-  Resolved with treatment of acute on chronic renal failure

## 2020-04-18 NOTE — Assessment & Plan Note (Signed)
-   responded well to IVF - educated patient's son on progression of CKD if DM not well controlled

## 2020-04-18 NOTE — Progress Notes (Signed)
PT Cancellation Note  Patient Details Name: Julias Mould MRN: [id number] DOB: [birth year]   Cancelled Treatment:    Reason Eval/Treat Not Completed: Patient declined, no reason specified    Doreatha Massed, PT Acute Rehabilitation  Office: [phone] Pager: [phone]

## 2020-04-18 NOTE — Care Management Important Message (Signed)
Important Message  Patient Details IM Letter given to [name] RN Case Manager to present to the Patient Name: [name] [name] MRN: [id number] Date of Birth: 1946/06/03   Medicare Important Message Given:  Yes     Kerin Salen 04/18/2020, 12:04 PM

## 2020-04-18 NOTE — Hospital Course (Addendum)
[name] [name] is a 74 y.o. male with medical history significant of type 2 diabetes (uncontrolled), history of CVA with mild cognitive impairment, hypertension, chronic kidney disease stage IV who was recently admitted from 6/12-6/17 with acute encephalopathy related to hyperosmolar hyperglycemia.   At that time his Lantus was reduced to 6 units/day with sliding scale for meal coverage due to ongoing hypoglycemic events.  At that admission he was discharged to a skilled nursing facility due to a complicated social situation.   He has ongoing cognitive impairment and is being cared for by 2 sisters who are also responsible for caring for his 74 year old mother and a brother with Down syndrome.    For this hospitalization, he was found to be in DKA and started on adequate protocol.  He responded well and his DKA resolved.  He was transitioned back to sliding scale insulin and Lantus. He also had acute on chronic renal failure which also responded to IV fluids.  His case was discussed with family especially his son [name] [name].   His blood pressure also remained above goal and his hydralazine was uptitrated as needed.  He may need further titration of other antihypertensives for adequate control.

## 2020-04-18 NOTE — Progress Notes (Signed)
OT Cancellation Note  Patient Details Name: [name] [name] MRN: [id number] DOB: October 04, 1946   Cancelled Treatment:    Reason Eval/Treat Not Completed: Patient declined, no reason specified Patient declines therapy stating adamantly "I'm comfortable in this bed and I'm watching tv."  [name] L Lacorey Brusca 04/18/2020, 9:51 AM

## 2020-04-18 NOTE — Assessment & Plan Note (Signed)
-  Due to DKA.  Mentation has returned to normal

## 2020-04-18 NOTE — TOC Progression Note (Addendum)
Transition of Care [facility]) - Progression Note    Patient Details  Name: [name] [name] MRN: [id number] Date of Birth: 1945-12-28  Transition of Care [facility]) CM/SW Contact  Leeroy Cha, RN Phone Number: 04/18/2020, 8:24 AM  Clinical Narrative:    Financial counselor Maren [place] called and left message to please assist this patient with medicaid application.  He is out of bed days through [employer]. [facility] [employer]has active medicaid.  Will let [facility] be aware. Charlane Ferretti with [facility]/insurance started late yesterday with [facility] medicare.  Has confirmed that patient does have community medicare.  WCB with auth when notified. Spoke with the patient to inform him of progress states that he does not want to go anywhere but his apartment.  States he perfectly capable of caring for himself. Patient has now decided to go to [facility] after talking with representative. Expected Discharge Plan: [place] Barriers to Discharge: Continued Medical Work up  Expected Discharge Plan and Services Expected Discharge Plan: [place]   Discharge Planning Services: CM Consult Post Acute Care Choice: [place] Living arrangements for the past 2 months: Apartment                                       Social Determinants of Health (SDOH) Interventions    Readmission Risk Interventions No flowsheet data found.

## 2020-04-18 NOTE — Progress Notes (Signed)
PROGRESS NOTE    [name] [name]   MRN:[id number]  DOB: 02/03/1946  DOA: 04/14/2020     4  PCP: Kremer, William Alfred, MD  CC: confusion, elevated glucose  Hospital Course: [name] [name] is a 74 y.o. male with medical history significant of type 2 diabetes (uncontrolled), history of CVA with mild cognitive impairment, hypertension, chronic kidney disease stage IV who was recently admitted from 6/12-6/17 with acute encephalopathy related to hyperosmolar hyperglycemia.   At that time his Lantus was reduced to 6 units/day with sliding scale for meal coverage due to ongoing hypoglycemic events.  At that admission he was discharged to a skilled nursing facility due to a complicated social situation.   He has ongoing cognitive impairment and is being cared for by 2 sisters who are also responsible for caring for his 105-year-old mother and a brother with Down syndrome.   For this hospitalization, he was found to be in DKA and started on adequate protocol.  He responded well and his DKA resolved.  He was transitioned back to sliding scale insulin and Lantus. He also had acute on chronic renal failure which also responded to IV fluids.  His case was discussed with family especially his son [name].  At time of discharge, the plan was for patient to discharge back home in care of his family.  The patient also adamantly refused going to rehab.   Interval History:  No events overnight. He is agitated this am and adamant on going home when able. He is refusing rehab/SNF placement. He also cannot provide any information on his insulin regimen at home nor how many times he gets a shot even though he says his family gives him the shots.   Old records reviewed in assessment of this patient  ROS: Constitutional: negative for chills, fatigue and fevers, Eyes: negative for visual disturbance, Cardiovascular: negative for chest pain and Gastrointestinal: negative for abdominal pain  Assessment &  Plan: Uncontrolled type 2 diabetes mellitus with diabetic nephropathy, with long-term current use of insulin ([name]) -A1c 10.3% in June 2021 -DKA has now resolved -Continue on Lantus and SSI  DKA, type 2 ([name]) - resolved with treatment   Acute renal failure superimposed on stage 4 chronic kidney disease ([name]) - responded well to IVF - educated patient's son on progression of CKD if DM not well controlled  Acute metabolic encephalopathy -Due to DKA.  Mentation has returned to normal  Hyperkalemia -Resolved with treatment of acute on chronic renal failure  Type 2 diabetes mellitus with hyperlipidemia ([name]) -Last A1c 10.3% in June 2021 -Continue SSI and Lantus  Hypertension -Continue amlodipine and hydralazine   Antimicrobials: n/a  DVT prophylaxis: Lovenox Code Status: Full Family Communication: Son, Richard Jr [phone] Disposition Plan:  . Patient came from: home        . Barriers to d/c OR conditions which need to be met to effect a safe d/c: Patient refusing rehab . The current disposition plan is discharge to: Home in care of family in 1-2 days   Objective: Vitals:   04/17/20 2158 04/18/20 0220 04/18/20 0537 04/18/20 1336  BP: (!) 153/81 (!) 145/85 (!) 156/82 (!) 160/96  Pulse: 92 82 71 80  Resp: 19 19 18 16  Temp: 97.8 F (36.6 C) 98.5 F (36.9 C) 98.4 F (36.9 C) 98.3 F (36.8 C)  TempSrc: Oral Oral Oral Oral  SpO2: 98% 96% 98% 98%  Weight:        Intake/Output Summary (Last 24 hours) at 04/18/2020   1605 Last data filed at 04/18/2020 1300 Gross per 24 hour  Intake 314.95 ml  Output 675 ml  Net -360.05 ml   Filed Weights   04/14/20 1319  Weight: 68.7 kg    Examination: General appearance: alert, no distress and agitated Head: Normocephalic, without obvious abnormality Eyes: EOMI Lungs: clear to auscultation bilaterally Heart: regular rate and rhythm and S1, S2 normal Abdomen: normal findings: bowel sounds normal and soft, non-tender Extremities:  no edema Skin: mobility and turgor normal Neurologic: Grossly normal   Consultants:   n/a  Procedures:   n/a  Data Reviewed: I have personally reviewed following labs and imaging studies Results for orders placed or performed during the hospital encounter of 04/14/20 (from the past 24 hour(s))  Glucose, capillary     Status: None   Collection Time: 04/17/20 10:08 PM  Result Value Ref Range   Glucose-Capillary 90 70 - 99 mg/dL  CBC with Differential/Platelet     Status: Abnormal   Collection Time: 04/18/20  5:18 AM  Result Value Ref Range   WBC 5.4 4.0 - 10.5 K/uL   RBC 2.87 (L) 4.22 - 5.81 MIL/uL   Hemoglobin 8.3 (L) 13.0 - 17.0 g/dL   HCT 26.9 (L) 39 - 52 %   MCV 93.7 80.0 - 100.0 fL   MCH 28.9 26.0 - 34.0 pg   MCHC 30.9 30.0 - 36.0 g/dL   RDW 14.1 11.5 - 15.5 %   Platelets 189 150 - 400 K/uL   nRBC 0.0 0.0 - 0.2 %   Neutrophils Relative % 42 %   Neutro Abs 2.2 1.7 - 7.7 K/uL   Lymphocytes Relative 39 %   Lymphs Abs 2.1 0.7 - 4.0 K/uL   Monocytes Relative 10 %   Monocytes Absolute 0.6 0 - 1 K/uL   Eosinophils Relative 8 %   Eosinophils Absolute 0.4 0 - 0 K/uL   Basophils Relative 1 %   Basophils Absolute 0.0 0 - 0 K/uL   Immature Granulocytes 0 %   Abs Immature Granulocytes 0.02 0.00 - 0.07 K/uL  Comprehensive metabolic panel     Status: Abnormal   Collection Time: 04/18/20  5:18 AM  Result Value Ref Range   Sodium 138 135 - 145 mmol/L   Potassium 4.2 3.5 - 5.1 mmol/L   Chloride 114 (H) 98 - 111 mmol/L   CO2 18 (L) 22 - 32 mmol/L   Glucose, Bld 159 (H) 70 - 99 mg/dL   BUN 40 (H) 8 - 23 mg/dL   Creatinine, Ser 3.35 (H) 0.61 - 1.24 mg/dL   Calcium 7.9 (L) 8.9 - 10.3 mg/dL   Total Protein 5.3 (L) 6.5 - 8.1 g/dL   Albumin 2.4 (L) 3.5 - 5.0 g/dL   AST 14 (L) 15 - 41 U/L   ALT 14 0 - 44 U/L   Alkaline Phosphatase 65 38 - 126 U/L   Total Bilirubin 0.5 0.3 - 1.2 mg/dL   GFR calc non Af Amer 17 (L) >60 mL/min   GFR calc Af Amer 20 (L) >60 mL/min   Anion gap 6 5 -  15  Phosphorus     Status: None   Collection Time: 04/18/20  5:18 AM  Result Value Ref Range   Phosphorus 4.2 2.5 - 4.6 mg/dL  Magnesium     Status: None   Collection Time: 04/18/20  5:18 AM  Result Value Ref Range   Magnesium 1.9 1.7 - 2.4 mg/dL  Glucose, capillary     Status:   Abnormal   Collection Time: 04/18/20  8:16 AM  Result Value Ref Range   Glucose-Capillary 221 (H) 70 - 99 mg/dL  Glucose, capillary     Status: Abnormal   Collection Time: 04/18/20 11:21 AM  Result Value Ref Range   Glucose-Capillary 190 (H) 70 - 99 mg/dL    Recent Results (from the past 240 hour(s))  SARS Coronavirus 2 by RT PCR (hospital order, performed in [place] hospital lab) Nasopharyngeal Nasopharyngeal Swab     Status: None   Collection Time: 04/14/20  1:44 PM   Specimen: Nasopharyngeal Swab  Result Value Ref Range Status   SARS Coronavirus 2 NEGATIVE NEGATIVE Final    Comment: (NOTE) SARS-CoV-2 target nucleic acids are NOT DETECTED.  The SARS-CoV-2 RNA is generally detectable in upper and lower respiratory specimens during the acute phase of infection. The lowest concentration of SARS-CoV-2 viral copies this assay can detect is 250 copies / mL. A negative result does not preclude SARS-CoV-2 infection and should not be used as the sole basis for treatment or other patient management decisions.  A negative result may occur with improper specimen collection / handling, submission of specimen other than nasopharyngeal swab, presence of viral mutation(s) within the areas targeted by this assay, and inadequate number of viral copies (<250 copies / mL). A negative result must be combined with clinical observations, patient history, and epidemiological information.  Fact Sheet for Patients:   https://www.fda.gov/media/136312/download  Fact Sheet for Healthcare Providers: https://www.fda.gov/media/136313/download  This test is not yet approved or  cleared by the [place] FDA and has been  authorized for detection and/or diagnosis of SARS-CoV-2 by FDA under an Emergency Use Authorization (EUA).  This EUA will remain in effect (meaning this test can be used) for the duration of the COVID-19 declaration under Section 564(b)(1) of the Act, 21 U.S.C. section 360bbb-3(b)(1), unless the authorization is terminated or revoked sooner.  Performed at [facility], [address] [place], [place] [address]   MRSA PCR Screening     Status: None   Collection Time: 04/14/20  9:57 PM   Specimen: Nasal Mucosa; Nasopharyngeal  Result Value Ref Range Status   MRSA by PCR NEGATIVE NEGATIVE Final    Comment:        The GeneXpert MRSA Assay (FDA approved for NASAL specimens only), is one component of a comprehensive MRSA colonization surveillance program. It is not intended to diagnose MRSA infection nor to guide or monitor treatment for MRSA infections. Performed at [facility], [address] [place], [place] [address]      Radiology Studies: No results found. DG Tibia/Fibula Right  Final Result    CT Head Wo Contrast  Final Result    DG Chest Port 1 View  Final Result       Scheduled Meds: . amLODipine  10 mg Oral Daily  . atorvastatin  40 mg Oral Daily  . Chlorhexidine Gluconate Cloth  6 each Topical Q0600  . enoxaparin (LOVENOX) injection  30 mg Subcutaneous Q24H  . hydrALAZINE  75 mg Oral TID  . insulin aspart  0-5 Units Subcutaneous QHS  . insulin aspart  0-9 Units Subcutaneous TID WC  . insulin glargine  6 Units Subcutaneous Daily  . levothyroxine  25 mcg Oral QAC breakfast  . sodium bicarbonate  650 mg Oral BID   PRN Meds: dextrose, hydrALAZINE, labetalol, oxyCODONE, polyethylene glycol Continuous Infusions: . sodium chloride 75 mL/hr at 04/17/20 1700      LOS:   4 days  Time spent: Greater than 50% of the 35 minute visit was spent in counseling/coordination of care for the patient as laid out in the A&P.    [name], MD [facility] 04/18/2020, 4:05 PM   Contact via secure chat.  To contact the attending provider between 7A-7P or the covering provider during after hours 7P-7A, please log into the web site [email or web address] and access using universal [place] password for that web site. If you do not have the password, please call the hospital operator.

## 2020-04-18 NOTE — Assessment & Plan Note (Addendum)
-  Continue amlodipine and hydralazine; hydralazine was uptitrated during hospitalization to max dose.  If blood pressure still uncontrolled, may need third agent

## 2020-04-18 NOTE — Assessment & Plan Note (Addendum)
-  Last A1c 10.3% in June 2021 -Continue SSI and Lantus -Early morning hypoglycemia noted while on Lantus 6 units daily; dose was decreased down to 4 units daily and still may need further adjustment, but glucose levels remained within goal on this dose

## 2020-04-19 LAB — CBC WITH DIFFERENTIAL/PLATELET
Abs Immature Granulocytes: 0.01 10*3/uL (ref 0.00–0.07)
Basophils Absolute: 0 10*3/uL (ref 0.0–0.1)
Basophils Relative: 1 %
Eosinophils Absolute: 0.4 10*3/uL (ref 0.0–0.5)
Eosinophils Relative: 9 %
HCT: 28.4 % — ABNORMAL LOW (ref 39.0–52.0)
Hemoglobin: 8.9 g/dL — ABNORMAL LOW (ref 13.0–17.0)
Immature Granulocytes: 0 %
Lymphocytes Relative: 36 %
Lymphs Abs: 1.8 10*3/uL (ref 0.7–4.0)
MCH: 29.1 pg (ref 26.0–34.0)
MCHC: 31.3 g/dL (ref 30.0–36.0)
MCV: 92.8 fL (ref 80.0–100.0)
Monocytes Absolute: 0.5 10*3/uL (ref 0.1–1.0)
Monocytes Relative: 10 %
Neutro Abs: 2.1 10*3/uL (ref 1.7–7.7)
Neutrophils Relative %: 44 %
Platelets: 205 10*3/uL (ref 150–400)
RBC: 3.06 MIL/uL — ABNORMAL LOW (ref 4.22–5.81)
RDW: 13.9 % (ref 11.5–15.5)
WBC: 4.8 10*3/uL (ref 4.0–10.5)
nRBC: 0 % (ref 0.0–0.2)

## 2020-04-19 LAB — GLUCOSE, CAPILLARY
Glucose-Capillary: 75 mg/dL (ref 70–99)
Glucose-Capillary: 296 mg/dL — ABNORMAL HIGH (ref 70–99)
Glucose-Capillary: 184 mg/dL — ABNORMAL HIGH (ref 70–99)
Glucose-Capillary: 109 mg/dL — ABNORMAL HIGH (ref 70–99)

## 2020-04-19 LAB — BASIC METABOLIC PANEL
Anion gap: 7 (ref 5–15)
BUN: 32 mg/dL — ABNORMAL HIGH (ref 8–23)
CO2: 20 mmol/L — ABNORMAL LOW (ref 22–32)
Calcium: 8.2 mg/dL — ABNORMAL LOW (ref 8.9–10.3)
Chloride: 115 mmol/L — ABNORMAL HIGH (ref 98–111)
Creatinine, Ser: 2.99 mg/dL — ABNORMAL HIGH (ref 0.61–1.24)
GFR calc Af Amer: 23 mL/min — ABNORMAL LOW (ref >60)
GFR calc non Af Amer: 19 mL/min — ABNORMAL LOW (ref >60)
Glucose, Bld: 60 mg/dL — ABNORMAL LOW (ref 70–99)
Potassium: 4 mmol/L (ref 3.5–5.1)
Sodium: 142 mmol/L (ref 135–145)

## 2020-04-19 LAB — MAGNESIUM: Magnesium: 1.8 mg/dL (ref 1.7–2.4)

## 2020-04-19 MED ORDER — INSULIN GLARGINE 100 UNIT/ML ~~LOC~~ SOLN
4.0000 [IU] | Freq: Every day | SUBCUTANEOUS | Status: DC
  Administered 2020-04-19 – 2020-04-20 (×2): 4 [IU] via SUBCUTANEOUS
  Filled 2020-04-19 (×2): qty 0.04

## 2020-04-19 MED ORDER — INSULIN GLARGINE 100 UNIT/ML ~~LOC~~ SOLN: 4.0000 [IU] | Freq: Every day | SUBCUTANEOUS | 0 refills | Status: DC

## 2020-04-19 MED ORDER — SODIUM BICARBONATE 650 MG PO TABS: 650.0000 mg | ORAL_TABLET | Freq: Two times a day (BID) | ORAL | 3 refills | Status: DC

## 2020-04-19 MED ORDER — HYDRALAZINE HCL 50 MG PO TABS
100.0000 mg | ORAL_TABLET | Freq: Three times a day (TID) | ORAL | Status: DC
  Administered 2020-04-19 – 2020-04-20 (×4): 100 mg via ORAL
  Filled 2020-04-19 (×4): qty 2

## 2020-04-19 MED ORDER — HYDRALAZINE HCL 100 MG PO TABS: 100.0000 mg | ORAL_TABLET | Freq: Three times a day (TID) | ORAL | 3 refills | Status: DC

## 2020-04-19 NOTE — Progress Notes (Signed)
Physical Therapy Treatment Patient Details Name: [name] [name] MRN: [id number] DOB: [birth year] Today's Date: 04/19/2020    History of Present Illness 74 y.o. male with PMH significant for T2DM, hypertension, CKD 4, history of DVT, history of CVA, EtOH use, cognitive decline presented to ED s/p fall having been found on floor and with AMS and hyperglycemia.    PT Comments    Assisted OOB to amb.  General transfer comment: "I don't need no walker" pt was able to rise OOB and transfer but required max use B UE to steady self.General Gait Details: first amb with rw at [place] with 50% VC's on safety with turns and proper walker to self distance.  Then amb without any AD as pt stated "I won't use this at home" noted increased gait instability with increased base of support, decreased speed and slight lateral sway with shuffled, lazy steps.  No overt LOB. Pt requested back to bed.    Follow Up Recommendations  SNF;Supervision/Assistance - 24 hour (pt declines SNF)     Equipment Recommendations  None recommended by PT (pt declines the need for a walker or a cane "I don't need that")    Recommendations for Other Services       Precautions / Restrictions Precautions Precautions: Fall Precaution Comments: impulsive    Mobility  Bed Mobility Overal bed mobility: Needs Assistance Bed Mobility: Supine to Sit;Sit to Supine     Supine to sit: Supervision Sit to supine: Supervision   General bed mobility comments: supervision for safety  Transfers Overall transfer level: Needs assistance Equipment used: Rolling walker (2 wheeled);None Transfers: Sit to/from Stand Sit to Stand: Min guard;Supervision         General transfer comment: "I don't need no walker" pt was able to rise OOB and transfer but required max use B UE to steady self.  Ambulation/Gait Ambulation/Gait assistance: Min assist;Min guard Gait Distance (Feet): 220 Feet Assistive device: Rolling walker (2  wheeled);None Gait Pattern/deviations: Step-through pattern;Decreased stride length;Drifts right/left;Trunk flexed     General Gait Details: first amb with rw at [place] with 50% VC's on safety with turns and proper walker to self distance.  Then amb without any AD as pt stated "I won't use this at home" noted increased gait instability with increased base of support, decreased speed and slight lateral sway with shuffled, lazy steps.  No overt LOB.   Stairs             Wheelchair Mobility    Modified Rankin (Stroke Patients Only)       Balance                                            Cognition Arousal/Alertness: Awake/alert Behavior During Therapy: Impulsive;Agitated Overall Cognitive Status: No family/caregiver present to determine baseline cognitive functioning Area of Impairment: Following commands;Safety/judgement;Problem solving                     Memory: Decreased recall of precautions;Decreased short-term memory                Exercises      General Comments        Pertinent Vitals/Pain Pain Assessment: No/denies pain    Home Living                      Prior Function  PT Goals (current goals can now be found in the care plan section) Acute Rehab PT Goals Patient Stated Goal: to go home Progress towards PT goals: Progressing toward goals    Frequency    Min 2X/week      PT Plan Current plan remains appropriate    Co-evaluation       OT goals addressed during session: ADL's and self-care;Other (comment) (functional mobility)      AM-PAC PT "6 Clicks" Mobility   Outcome Measure  Help needed turning from your back to your side while in a flat bed without using bedrails?: None Help needed moving from lying on your back to sitting on the side of a flat bed without using bedrails?: A Little Help needed moving to and from a bed to a chair (including a wheelchair)?: A  Little Help needed standing up from a chair using your arms (e.g., wheelchair or bedside chair)?: A Little Help needed to walk in hospital room?: A Little Help needed climbing 3-5 steps with a railing? : A Little 6 Click Score: 19    End of Session Equipment Utilized During Treatment: Gait belt Activity Tolerance: Patient tolerated treatment well Patient left: in bed;with call bell/phone within reach;with bed alarm set Nurse Communication: Mobility status PT Visit Diagnosis: Unsteadiness on feet (R26.81);Muscle weakness (generalized) (M62.81);Difficulty in walking, not elsewhere classified (R26.2)     Time: 3244-0102 PT Time Calculation (min) (ACUTE ONLY): 13 min  Charges:  $Gait Training: 8-22 mins                     Rica Koyanagi  PTA Acute  Rehabilitation Services Pager      [phone] Office      [phone]

## 2020-04-19 NOTE — Progress Notes (Signed)
Occupational Therapy Treatment Patient Details Name: [name] [name] MRN: [id number] DOB: 09/03/1946 Today's Date: 04/19/2020    History of present illness 74 y.o. male with PMH significant for T2DM, hypertension, CKD 4, history of DVT, history of CVA, EtOH use, cognitive decline presented to ED s/p fall having been found on floor and with AMS and hyperglycemia.   OT comments  [name] demonstrates improved functional mobility today requiring only min guard for ambulation in room with RW. Patient independent with bed mobility. Patient able to perform sit to stands without assistance. Patient moves fast and continues to demonstrate impulsive behavior - however improved balance. Patient refused ADLs other than to demonstrate ability to doff and donn socks. Therapist recommended use of RW at home which patient adamantly refused. Patient easily irritated by therapist's questions and suggestions. Patient adamant about going home and living alone. Highly recommend 24/7 assistance at discharge.    Follow Up Recommendations  SNF;Supervision/Assistance - 24 hour    Equipment Recommendations  None recommended by OT    Recommendations for Other Services      Precautions / Restrictions Precautions Precautions: Fall       Mobility Bed Mobility   Bed Mobility: Supine to Sit;Sit to Supine     Supine to sit: Supervision Sit to supine: Supervision   General bed mobility comments: supervision for safety  Transfers Overall transfer level: Needs assistance Equipment used: Rolling walker (2 wheeled) Transfers: Sit to/from Stand Sit to Stand: Min guard         General transfer comment: Patient min guard to ambulate in room x 2 laps with rw. No loss of balance today. Patient able to perform multiple sit to stands and bed mobility with supervision only.    Balance                                           ADL either performed or assessed with clinical judgement   ADL                    Upper Body Dressing : Supervision/safety Upper Body Dressing Details (indicate cue type and reason): Patient demonstrated ability to donn socks seated at side of bed.                         Vision       Perception     Praxis      Cognition Arousal/Alertness: Awake/alert Behavior During Therapy: Impulsive;Agitated Overall Cognitive Status: No family/caregiver present to determine baseline cognitive functioning                                          Exercises     Shoulder Instructions       General Comments      Pertinent Vitals/ Pain       Pain Assessment: No/denies pain  Home Living                                          Prior Functioning/Environment              Frequency  Progress Toward Goals  OT Goals(current goals can now be found in the care plan section)  Progress towards OT goals: Progressing toward goals  Acute Rehab OT Goals Patient Stated Goal: to go home OT Goal Formulation: With patient Time For Goal Achievement: 04/29/20 Potential to Achieve Goals: [place] Discharge plan remains appropriate;Frequency remains appropriate    Co-evaluation          OT goals addressed during session: ADL's and self-care;Other (comment) (functional mobility)      AM-PAC OT "6 Clicks" Daily Activity     Outcome Measure     Help from another person taking care of personal grooming?: None Help from another person toileting, which includes using toliet, bedpan, or urinal?: A Little Help from another person bathing (including washing, rinsing, drying)?: A Little Help from another person to put on and taking off regular upper body clothing?: A Little Help from another person to put on and taking off regular lower body clothing?: A Little 6 Click Score: 16    End of Session Equipment Utilized During Treatment: Rolling walker;Gait belt  OT Visit Diagnosis:  Unsteadiness on feet (R26.81);Other abnormalities of gait and mobility (R26.89);Muscle weakness (generalized) (M62.81);Other symptoms and signs involving cognitive function   Activity Tolerance Patient tolerated treatment well   Patient Left in bed;with call bell/phone within reach;with bed alarm set   Nurse Communication  (okay to see per RN)        Time: 2244-9753 OT Time Calculation (min): 20 min  Charges: OT General Charges $OT Visit: 1 Visit OT Treatments $Therapeutic Activity: 8-22 mins  Derl Barrow, OTR/L [place]  Office [phone] Pager: [place] 04/19/2020, 1:03 PM

## 2020-04-19 NOTE — TOC Progression Note (Addendum)
Transition of Care [facility]) - Progression Note    Patient Details  Name: [name] [name] MRN: [id number] Date of Birth: [birth year]  Transition of Care [facility]) CM/SW Contact  Servando Snare, [place] Phone Number: 04/19/2020, 10:22 AM  Clinical Narrative:   Patient declines SNF. Patient from home alone. Patient reports that he ambulates independently at [place] line and is independent in ADLs. Patient prefers to go home. Patient is open to home health, but reports that he did not like it in the past. LCSW notified attending and floor RN.   DC plan: home  3:21 PM ** LCSW received a call from [place] stating that patient has a bed and Authorization. LCSW followed up with patient who continues to refuse SNF and states he wants to go home.   [name] CSW [phone]     Expected Discharge Plan: [place] Barriers to Discharge: Continued Medical Work up  Expected Discharge Plan and Services Expected Discharge Plan: [place]   Discharge Planning Services: CM Consult Post Acute Care Choice: [place] Living arrangements for the past 2 months: Apartment                                       Social Determinants of Health (SDOH) Interventions    Readmission Risk Interventions No flowsheet data found.

## 2020-04-19 NOTE — Progress Notes (Signed)
Inpatient Diabetes Program Recommendations  AACE/ADA: New Consensus Statement on Inpatient Glycemic Control (2015)  Target Ranges:  Prepandial:   less than 140 mg/dL      Peak postprandial:   less than 180 mg/dL (1-2 hours)      Critically ill patients:  140 - 180 mg/dL   Lab Results  Component Value Date   GLUCAP 296 (H) 04/19/2020   HGBA1C 10.3 (H) 03/24/2020    Review of Glycemic Control  Diabetes history: DM2 Outpatient Diabetes medications: Lantus 6 units QHS, Humalog 1-7 units tidwc (only taking bid) Current orders for Inpatient glycemic control: Lantus 4 units QD, Novolog 0-9 units tidwc and 0-5 units QHS  HgbA1C - 10.3% Pt is d/cing home.  Inpatient Diabetes Program Recommendations:     Met with pt at bedside. Pt states he got mad at his sister (who gives him insulin) and told her to get out of his house. Pt states he can give his own insulin.  Diabetes Coordinator spoke with pt's sister during last admission (3 weeks ago) and sister states she goes to pt's house 2x/day to give insulin. Coordinator also spoke to sister and stated pt was drinking regular Gingerale and juice. Also drinks juice and regular soda for hypoglycemia. Concern with pt going home and being unable to manage his diabetes, but refuses SNF. Hopefully sister will continue to give pt his insulin injections.  Discussed with RN.  Thank you. Lorenda Peck, RD, LDN, CDE Inpatient Diabetes Coordinator [phone]

## 2020-04-20 LAB — BASIC METABOLIC PANEL WITH GFR
Anion gap: 6 (ref 5–15)
BUN: 35 mg/dL — ABNORMAL HIGH (ref 8–23)
CO2: 21 mmol/L — ABNORMAL LOW (ref 22–32)
Calcium: 7.9 mg/dL — ABNORMAL LOW (ref 8.9–10.3)
Chloride: 113 mmol/L — ABNORMAL HIGH (ref 98–111)
Creatinine, Ser: 3.08 mg/dL — ABNORMAL HIGH (ref 0.61–1.24)
GFR calc Af Amer: 22 mL/min — ABNORMAL LOW
GFR calc non Af Amer: 19 mL/min — ABNORMAL LOW
Glucose, Bld: 207 mg/dL — ABNORMAL HIGH (ref 70–99)
Potassium: 4.5 mmol/L (ref 3.5–5.1)
Sodium: 140 mmol/L (ref 135–145)

## 2020-04-20 LAB — CBC WITH DIFFERENTIAL/PLATELET
Abs Immature Granulocytes: 0.01 K/uL (ref 0.00–0.07)
Basophils Absolute: 0 K/uL (ref 0.0–0.1)
Basophils Relative: 1 %
Eosinophils Absolute: 0.4 K/uL (ref 0.0–0.5)
Eosinophils Relative: 9 %
HCT: 26.3 % — ABNORMAL LOW (ref 39.0–52.0)
Hemoglobin: 8.1 g/dL — ABNORMAL LOW (ref 13.0–17.0)
Immature Granulocytes: 0 %
Lymphocytes Relative: 34 %
Lymphs Abs: 1.7 K/uL (ref 0.7–4.0)
MCH: 28.5 pg (ref 26.0–34.0)
MCHC: 30.8 g/dL (ref 30.0–36.0)
MCV: 92.6 fL (ref 80.0–100.0)
Monocytes Absolute: 0.6 K/uL (ref 0.1–1.0)
Monocytes Relative: 11 %
Neutro Abs: 2.3 K/uL (ref 1.7–7.7)
Neutrophils Relative %: 45 %
Platelets: 176 K/uL (ref 150–400)
RBC: 2.84 MIL/uL — ABNORMAL LOW (ref 4.22–5.81)
RDW: 13.7 % (ref 11.5–15.5)
WBC: 5 K/uL (ref 4.0–10.5)
nRBC: 0 % (ref 0.0–0.2)

## 2020-04-20 LAB — SARS CORONAVIRUS 2 BY RT PCR (HOSPITAL ORDER, PERFORMED IN ~~LOC~~ HOSPITAL LAB): SARS Coronavirus 2: NEGATIVE

## 2020-04-20 LAB — MAGNESIUM: Magnesium: 1.9 mg/dL (ref 1.7–2.4)

## 2020-04-20 LAB — GLUCOSE, CAPILLARY
Glucose-Capillary: 214 mg/dL — ABNORMAL HIGH (ref 70–99)
Glucose-Capillary: 338 mg/dL — ABNORMAL HIGH (ref 70–99)

## 2020-04-20 MED ORDER — INSULIN ASPART 100 UNIT/ML ~~LOC~~ SOLN: 1.0000 [IU] | Freq: Three times a day (TID) | SUBCUTANEOUS | 11 refills | Status: DC

## 2020-04-20 NOTE — Progress Notes (Signed)
Pt discharged to [facility] today per Dr. Sabino Gasser. Pt's IV site d/c'd and WDL. Pt's VSS. Report called to Tammy, receiving RN. Verbalized understanding. Pt currently waiting on [facility] for transport.

## 2020-04-20 NOTE — Plan of Care (Signed)

## 2020-04-20 NOTE — Progress Notes (Signed)
PROGRESS NOTE    [name] [name]   RCV:[id number]  DOB: 1946/02/25  DOA: 04/14/2020     6  PCP: Libby Maw, MD  CC: confusion, elevated glucose  Hospital Course: [name] [name] is a 74 y.o. male with medical history significant of type 2 diabetes (uncontrolled), history of CVA with mild cognitive impairment, hypertension, chronic kidney disease stage IV who was recently admitted from 6/12-6/17 with acute encephalopathy related to hyperosmolar hyperglycemia.   At that time his Lantus was reduced to 6 units/day with sliding scale for meal coverage due to ongoing hypoglycemic events.  At that admission he was discharged to a skilled nursing facility due to a complicated social situation.   He has ongoing cognitive impairment and is being cared for by 2 sisters who are also responsible for caring for his 34 year old mother and a brother with Down syndrome.    For this hospitalization, he was found to be in DKA and started on adequate protocol.  He responded well and his DKA resolved.  He was transitioned back to sliding scale insulin and Lantus. He also had acute on chronic renal failure which also responded to IV fluids.  His case was discussed with family especially his son [name] [name].   His blood pressure also remained above goal and his hydralazine was uptitrated as needed.  He may need further titration of other antihypertensives for adequate control.   Interval History:  No events overnight. Mood much improved today. He felt okay in general. There was much discussion between patient and family today regarding disposition.   Old records reviewed in assessment of this patient  ROS: Constitutional: negative for chills, fatigue and fevers, Eyes: negative for visual disturbance, Cardiovascular: negative for chest pain and Gastrointestinal: negative for abdominal pain  Assessment & Plan: Uncontrolled type 2 diabetes mellitus with diabetic nephropathy, with long-term current use of  insulin ([name]) -A1c 10.3% in June 2021 -DKA has now resolved -Continue on Lantus and SSI  DKA, type 2 ([place]) - resolved with treatment   Acute renal failure superimposed on stage 4 chronic kidney disease ([place]) - responded well to IVF - educated patient's son on progression of CKD if DM not well controlled  Acute metabolic encephalopathy -Due to DKA.  Mentation has returned to normal  Hyperkalemia -Resolved with treatment of acute on chronic renal failure  Type 2 diabetes mellitus with hyperlipidemia ([name]) -Last A1c 10.3% in June 2021 -Continue SSI and Lantus -Early morning hypoglycemia noted while on Lantus 6 units daily; dose was decreased down to 4 units daily and still may need further adjustment, but glucose levels remained within goal on this dose  Hypertension -Continue amlodipine and hydralazine; hydralazine was uptitrated during hospitalization to max dose.  If blood pressure still uncontrolled, may need third agent   Antimicrobials: n/a  DVT prophylaxis: Lovenox Code Status: Full Family Communication: [name] [name] [phone] Disposition Plan:  . Patient came from: home        . Barriers to d/c OR conditions which need to be met to effect a safe d/c: Patient refusing rehab . The current disposition plan is discharge to: Home in care of family in 1-2 days   Objective: Vitals:   04/19/20 0900 04/19/20 1220 04/19/20 2019 04/20/20 0609  BP: (!) 149/88 (!) 150/86 139/83 (!) 154/87  Pulse: 81 76 76 74  Resp: '16 19 19 19  ' Temp: 98.6 F (37 C) 98.1 F (36.7 C) 99.1 F (37.3 C) 98.5 F (36.9 C)  TempSrc: Oral Oral  Oral  SpO2: 97% 99% 98% 100%  Weight:      Height:        Intake/Output Summary (Last 24 hours) at 04/20/2020 1105 Last data filed at 04/20/2020 1023 Gross per 24 hour  Intake 1395 ml  Output 1445 ml  Net -50 ml   Filed Weights   04/14/20 1319 04/18/20 2225  Weight: 68.7 kg 70.8 kg    Examination: General appearance: alert, no distress and  agitated Head: Normocephalic, without obvious abnormality Eyes: EOMI Lungs: clear to auscultation bilaterally Heart: regular rate and rhythm and S1, S2 normal Abdomen: normal findings: bowel sounds normal and soft, non-tender Extremities: no edema Skin: mobility and turgor normal Neurologic: Grossly normal   Consultants:   n/a  Procedures:   n/a  Data Reviewed: I have personally reviewed following labs and imaging studies Results for orders placed or performed during the hospital encounter of 04/14/20 (from the past 24 hour(s))  Glucose, capillary     Status: Abnormal   Collection Time: 04/19/20 11:06 AM  Result Value Ref Range   Glucose-Capillary 296 (H) 70 - 99 mg/dL  Glucose, capillary     Status: Abnormal   Collection Time: 04/19/20  4:45 PM  Result Value Ref Range   Glucose-Capillary 184 (H) 70 - 99 mg/dL  Glucose, capillary     Status: Abnormal   Collection Time: 04/19/20  9:24 PM  Result Value Ref Range   Glucose-Capillary 109 (H) 70 - 99 mg/dL  Basic metabolic panel     Status: Abnormal   Collection Time: 04/20/20  4:57 AM  Result Value Ref Range   Sodium 140 135 - 145 mmol/L   Potassium 4.5 3.5 - 5.1 mmol/L   Chloride 113 (H) 98 - 111 mmol/L   CO2 21 (L) 22 - 32 mmol/L   Glucose, Bld 207 (H) 70 - 99 mg/dL   BUN 35 (H) 8 - 23 mg/dL   Creatinine, Ser 3.08 (H) 0.61 - 1.24 mg/dL   Calcium 7.9 (L) 8.9 - 10.3 mg/dL   GFR calc non Af Amer 19 (L) >60 mL/min   GFR calc Af Amer 22 (L) >60 mL/min   Anion gap 6 5 - 15  CBC with Differential/Platelet     Status: Abnormal   Collection Time: 04/20/20  4:57 AM  Result Value Ref Range   WBC 5.0 4.0 - 10.5 K/uL   RBC 2.84 (L) 4.22 - 5.81 MIL/uL   Hemoglobin 8.1 (L) 13.0 - 17.0 g/dL   HCT 26.3 (L) 39 - 52 %   MCV 92.6 80.0 - 100.0 fL   MCH 28.5 26.0 - 34.0 pg   MCHC 30.8 30.0 - 36.0 g/dL   RDW 13.7 11.5 - 15.5 %   Platelets 176 150 - 400 K/uL   nRBC 0.0 0.0 - 0.2 %   Neutrophils Relative % 45 %   Neutro Abs 2.3 1.7 -  7.7 K/uL   Lymphocytes Relative 34 %   Lymphs Abs 1.7 0.7 - 4.0 K/uL   Monocytes Relative 11 %   Monocytes Absolute 0.6 0 - 1 K/uL   Eosinophils Relative 9 %   Eosinophils Absolute 0.4 0 - 0 K/uL   Basophils Relative 1 %   Basophils Absolute 0.0 0 - 0 K/uL   Immature Granulocytes 0 %   Abs Immature Granulocytes 0.01 0.00 - 0.07 K/uL  Magnesium     Status: None   Collection Time: 04/20/20  4:57 AM  Result Value Ref Range   Magnesium  1.9 1.7 - 2.4 mg/dL  Glucose, capillary     Status: Abnormal   Collection Time: 04/20/20  7:40 AM  Result Value Ref Range   Glucose-Capillary 214 (H) 70 - 99 mg/dL    Recent Results (from the past 240 hour(s))  SARS Coronavirus 2 by RT PCR (hospital order, performed in [facility] hospital lab) Nasopharyngeal Nasopharyngeal Swab     Status: None   Collection Time: 04/14/20  1:44 PM   Specimen: Nasopharyngeal Swab  Result Value Ref Range Status   SARS Coronavirus 2 NEGATIVE NEGATIVE Final    Comment: (NOTE) SARS-CoV-2 target nucleic acids are NOT DETECTED.  The SARS-CoV-2 RNA is generally detectable in upper and lower respiratory specimens during the acute phase of infection. The lowest concentration of SARS-CoV-2 viral copies this assay can detect is 250 copies / mL. A negative result does not preclude SARS-CoV-2 infection and should not be used as the sole basis for treatment or other patient management decisions.  A negative result may occur with improper specimen collection / handling, submission of specimen other than nasopharyngeal swab, presence of viral mutation(s) within the areas targeted by this assay, and inadequate number of viral copies (<250 copies / mL). A negative result must be combined with clinical observations, patient history, and epidemiological information.  Fact Sheet for Patients:   StrictlyIdeas.no  Fact Sheet for Healthcare Providers: BankingDealers.co.za  This test is  not yet approved or  cleared by the [place] FDA and has been authorized for detection and/or diagnosis of SARS-CoV-2 by FDA under an Emergency Use Authorization (EUA).  This EUA will remain in effect (meaning this test can be used) for the duration of the COVID-19 declaration under Section 564(b)(1) of the Act, 21 U.S.C. section 360bbb-3(b)(1), unless the authorization is terminated or revoked sooner.  Performed at [facility], [place] [address] [place], [place] [address]   MRSA PCR Screening     Status: None   Collection Time: 04/14/20  9:57 PM   Specimen: Nasal Mucosa; Nasopharyngeal  Result Value Ref Range Status   MRSA by PCR NEGATIVE NEGATIVE Final    Comment:        The GeneXpert MRSA Assay (FDA approved for NASAL specimens only), is one component of a comprehensive MRSA colonization surveillance program. It is not intended to diagnose MRSA infection nor to guide or monitor treatment for MRSA infections. Performed at [facility], [place] [address] [place], [place] [address]      Radiology Studies: No results found. DG Tibia/Fibula Right  Final Result    CT Head Wo Contrast  Final Result    DG Chest Port 1 View  Final Result       Scheduled Meds: . amLODipine  10 mg Oral Daily  . atorvastatin  40 mg Oral Daily  . Chlorhexidine Gluconate Cloth  6 each Topical Q0600  . enoxaparin (LOVENOX) injection  30 mg Subcutaneous Q24H  . hydrALAZINE  100 mg Oral TID  . insulin aspart  0-5 Units Subcutaneous QHS  . insulin aspart  0-9 Units Subcutaneous TID WC  . insulin glargine  4 Units Subcutaneous Daily  . levothyroxine  25 mcg Oral QAC breakfast  . sodium bicarbonate  650 mg Oral BID   PRN Meds: dextrose, hydrALAZINE, labetalol, oxyCODONE, polyethylene glycol Continuous Infusions: . sodium chloride Stopped (04/19/20 1600)      LOS: 6 days  Time spent: Greater than 50% of the 35 minute visit was spent in  counseling/coordination of  care for the patient as laid out in the A&P.   [name], MD [facility] 04/20/2020, 11:05 AM Late entry. Patient seen and round on, on 04/19/20  Contact via secure chat.  To contact the attending provider between 7A-7P or the covering provider during after hours 7P-7A, please log into the web site [email or web address] and access using universal [place] password for that web site. If you do not have the password, please call the hospital operator.

## 2020-04-20 NOTE — Discharge Instructions (Signed)
Continuous Peripheral Nerve Block Infusion Self-Care A continuous peripheral nerve block infusion provides pain relief after certain kinds of surgery, such as shoulder or knee surgery. A pump delivers numbing medicine near a nerve, which lessens the amount of pain you feel and allows you to be leave the hospital sooner. How to protect the numb area Until feeling returns to the numbed area of your body, take these actions to protect the numb area.  Carefully pad the numb area. This will help prevent injury and the development of sores.  Be careful when using items that are hot or cold. You will not be able to feel temperature in the numb area, so your skin in that area can get damaged more easily.  If your arm or leg is numb, wear a sling or brace as told by your health care provider. If you were given crutches, use them as told.  Try to keep the numb area raised (elevated) whenever possible. This will help lessen the amount of swelling and make you more comfortable. How to care for the catheter Take these actions to care for your catheter for as long as it is in place:  While bathing, make sure to keep the catheter site, pump, and tubing dry.  Try to avoid kinking or pinching of the catheter.  Avoid pulling or tugging on the catheter. Follow your health care provider's instructions about when and how to remove the catheter. For most devices, you will need to take the following steps: 1. Wash your hands with soap and warm water. 2. Remove the bandage (dressing) along with any tape or additional bandages that are used to secure the catheter. 3. Firmly grasp the catheter where it enters the skin. 4. Gently pull the catheter. The catheter should come out easily. If it does not, or if you have pain while you pull it, stop and call your health care provider. Do not cut the catheter. 5. Look at the catheter to make sure the entire catheter was removed. Most catheters have a mark on the end. If you  do not see a mark, or if the tubing is broken, contact your health care provider right away. 6. Cover the opening with an adhesive dressing. 7. Throw away the pump and tubing.  When you remove the catheter, it is normal to see a small amount of fluid in the place where the catheter was. Get help right away if:  The place where the catheter was inserted becomes red, painful, or swollen.  There is yellowish fluid coming from the place where the catheter was.  You have a rash or hives.  You have uncontrolled pain in the area that should be numbed.  You have a fever.  You have symptoms of a reaction to the nerve block. Symptoms of a reaction include: ? Numb lips. ? A metallic taste in your mouth. ? Ringing in your ears. ? Increased anxiety. ? Sudden unexpected sleepiness. ? Blurred vision. ? Dizziness. ? Tremors, shaking, twitching, or seizures. ? Changes in bowel or bladder control. ? Trouble breathing. If you have any of these symptoms, clamp the catheter to stop the medicine from going into your body and get medical care right away. This information is not intended to replace advice given to you by your health care provider. Make sure you discuss any questions you have with your health care provider. Document Revised: 09/11/2017 Document Reviewed: 01/25/2016 Elsevier Patient Education  [place].   Managing Pain Without Opioids Opioids are  strong medicines used to treat moderate to severe pain. For some people, especially those who have long-term (chronic) pain, opioids may not be the best choice for pain management due to:  Side effects like nausea, constipation, and sleepiness.  The risk of addiction (opioid use disorder). The longer you take opioids, the greater your risk of addiction. Pain that lasts for more than 3 months is called chronic pain. Managing chronic pain usually requires more than one approach and is often provided by a team of health care providers  working together (multidisciplinary approach). Pain management may be done at a pain management center or pain clinic. Types of pain management without opioids Managing pain without opioids can involve:  Non-opioid medicines.  Exercises to help relieve pain and improve strength and range of motion (physical therapy).  Therapy to help with everyday tasks and activities (occupational therapy).  Therapy to help you find ways to relieve pain by doing things you enjoy (recreational therapy).  Talk therapy (psychotherapy) and other mental health therapies.  Medical treatments such as injections or devices.  Making lifestyle changes. Pain management options Non-opioid medicines Non-opioid medicines for pain may include medicines taken by mouth (oral medicines), such as:  Over-the-counter or prescription NSAIDs. These may be the first medicines used for pain. They work well for muscle and bone pain, and they reduce swelling.  Acetaminophen. This over-the-counter medicine may work well for milder pain but not swelling.  Antidepressants. These may be used to treat chronic pain. A certain type of antidepressant (tricyclics) is often used. These medicines are given in lower doses for pain than when used for depression.  Anticonvulsants. These are usually used to treat seizures but may also reduce nerve (neuropathic) pain.  Muscle relaxants. These relieve pain caused by sudden muscle tightening (spasms). You may also use a type of pain medicine that is applied to the skin as a patch, cream, or gel (topical analgesic), such as a numbing medicine. These may cause fewer side effects than oral medicines. Therapy Physical therapy involves doing exercises to gain strength and flexibility. A physical therapist may teach you exercises to move and stretch parts of your body that are weak, stiff, or painful. You can learn these exercises at physical therapy visits and practice them at home. Physical therapy  may also involve:  Massage.  Heat wraps or applying heat or cold to affected areas.  Sending electrical signals through the skin to interrupt pain signals (transcutaneous electrical nerve stimulation, TENS).  Sending weak lasers through the skin to reduce pain and swelling (low-level laser therapy).  Using signals from your body to help you learn to regulate pain (biofeedback). Occupational therapy helps you learn ways to function at home and work with less pain. Recreational therapy may involve trying new activities or hobbies, such as drawing or a physical activity. Types of mental health therapy for pain include:  Cognitive behavioral therapy (CBT) to help you learn coping skills for dealing with pain.  Acceptance and commitment therapy (ACT) to change the way you think and react to pain.  Relaxation therapies, including muscle relaxation exercises and focusing your mind on the present moment to lower stress (mindfulness-based stress reduction).  Pain management counseling. This may be individual, family, or group counseling.  Medical treatments Medical treatments for pain management include:  Nerve block injections. These may include a pain blocker and anti-inflammatory medicines. You may have injections: ? Near the spine to relieve chronic back or neck pain. ? Into joints to relieve back  or joint pain. ? Into nerve areas that supply a painful area to relieve body pain. ? Into muscles (trigger point injections) to relieve some painful muscle conditions.  A medical device placed near your spine to help block pain signals and relieve nerve pain or chronic back pain (spinal cord stimulation device).  Acupuncture. Follow these instructions at home Medicines  Take over-the-counter and prescription medicines only as told by your health care provider.  If you are taking pain medicine, ask your health care providers about possible side effects to watch out for.  Do not drive or  use heavy machinery while taking prescription pain medicine. Lifestyle   Do not use drugs or alcohol to reduce pain. Limit alcohol intake to no more than 1 drink a day for nonpregnant women and 2 drinks a day for men. One drink equals 12 oz of beer, 5 oz of wine, or 1 oz of hard liquor.  Do not use any products that contain nicotine or tobacco, such as cigarettes and e-cigarettes. These can delay healing. If you need help quitting, ask your health care provider.  Eat a healthy diet and maintain a healthy weight. Poor diet and excess weight may make pain worse. ? Eat foods that are high in fiber. These include fresh fruits and vegetables, whole grains, and beans. ? Limit foods that are high in fat and processed sugars, such as fried and sweet foods.  Exercise regularly. Exercise lowers stress and may help relieve pain. ? Ask your health care provider what activities and exercises are safe for you. ? If your health care provider approves, join an exercise class that combines movement and stress reduction. Examples include yoga and tai chi.  Get enough sleep. Lack of sleep may make pain worse.  Lower stress as much as possible. Practice stress reduction techniques as told by your therapist. General instructions  Work with all your pain management providers to find the treatments that work best for you. You are an important member of your pain management team. There are many things you can do to reduce pain on your own.  Consider joining an online or in-person support group for people who have chronic pain.  Keep all follow-up visits as told by your health care providers. This is important. Where to find more information You can find more information about managing pain without opioids from:  American Academy of Pain Medicine: painmed.[place] for Chronic Pain: instituteforchronicpain.org  American Chronic Pain Association: theacpa.org Contact a health care provider if:  You  have side effects from pain medicine.  Your pain gets worse or does not get better with treatments or home care.  You are struggling with anxiety or depression. Summary  Many types of pain can be managed without opioids. Chronic pain may respond better to pain management without opioids.  Pain is best managed with a team of providers working together.  Pain management without opioids may include non-opioid medicines, medical treatments, physical therapy, mental health therapy, and lifestyle changes.  Tell your health care providers if your pain gets worse or is not being managed well enough. This information is not intended to replace advice given to you by your health care provider. Make sure you discuss any questions you have with your health care provider. Document Revised: 06/22/2019 Document Reviewed: 07/21/2017 Elsevier Patient Education  [place] Pain Cancer pain is different for everyone. It is important to work with your cancer care team to develop a pain  management plan that is best for you. There are many options for pain control, and cancer pain can usually be managed with the right plan. Making lifestyle changes and following home care instructions from your cancer care team are also important parts of your plan. This can improve your quality of life while managing cancer pain. How to manage lifestyle changes Managing cancer pain can be stressful and exhausting. You may find that it is harder to control your pain when your levels of stress and exhaustion increase. Making lifestyle changes may help you lower stress and manage cancer pain. Managing stress   Try stress reduction techniques. These may lower stress and anxiety and take your mind off your pain. Ask your cancer care team for advice on good options. You may want to try: ? Guided relaxation. ? Meditation. ? Self-hypnosis. ? Deep breathing. ? Gentle yoga. ? Tai chi. ? Massage.  Get  regular exercise. Exercise lowers stress and helps you sleep better at night. Ask your cancer care team what type of exercise is best for you.  Make sure you have a good support system of friends, family, and other care providers. Use your support system for emotional support and for help with daily chores and activities.  Consider joining a cancer support group. Ask your cancer care team to recommend one in your area.  Do not use drugs or alcohol to manage stress. This can make both stress and pain worse. Avoiding exhaustion  Eat a healthy diet. Work with your cancer care team to come up with a nutritious diet that is best for you.  Plan activities for times when you are most rested and your pain is best controlled. This will help you avoid becoming physically exhausted.  Get enough sleep. Being overtired can increase pain. Tell your cancer care team if you are having trouble sleeping. Follow these instructions at home: Medicines  Take over-the-counter and prescription medicines only as told by your health care provider.  Take your pain medicine on a regular schedule before pain becomes too severe.  You may have medicine for pain that happens in between doses of your usual pain control medicine (breakthrough pain). Do not wait until breakthrough pain is severe before taking the designated medicine.  Do not stop or reduce your pain medicine before talking to your cancer care team.  Do not crush or break pain pills unless your health care provider says you can.  Keep a 1-week supply of pain medicine on hand. Store your medicine safely away from children and pets.  Keep a complete list of all your medicines. Activity  Return to your normal activities as told by your health care provider. Ask your health care provider what activities are safe for you.  Do exercises as told by your health care provider. Increase your activity level as your pain is relieved.  Do not drive or use heavy  machinery while taking prescription pain medicine. Managing constipation You may need to take actions to prevent or treat constipation, such as:  Drink enough fluid to keep your urine pale yellow.  Take over-the-counter or prescription medicines.  Eat foods that are high in fiber, such as beans, whole grains, and fresh fruits and vegetables.  Limit foods that are high in fat and processed sugars, such as fried or sweet foods. Alcohol use  Do not drink alcohol if: ? Your health care provider tells you not to drink. ? You are pregnant, may be pregnant, or are planning to become pregnant.  If you drink alcohol: ? Limit how much you use to:  0-1 drink a day for women.  0-2 drinks a day for men. ? Be aware of how much alcohol is in your drink. In the [place] one drink equals one 12 oz bottle of beer (355 mL), one 5 oz glass of wine (148 mL), or one 1 oz glass of hard liquor (44 mL). General instructions   Ask your cancer care team about keeping a pain diary. This may help your care team adjust your pain control plan as needed.  Ask to meet with a pain specialist who can help create a plan of care that works well for you.  Use gentle creams and lotions to keep your skin moist.  Do not use any products that contain nicotine or tobacco, such as cigarettes, e-cigarettes, and chewing tobacco. If you need help quitting, ask your health care provider.  Keep all follow-up visits as told by your health care provider. This is important. Where to find more information  American Cancer Society: www.cancer.[name]: www.cancer.gov Contact a health care provider if:  Your pain medicine is not controlling your pain.  You have trouble sleeping.  You feel depressed or anxious.  Your pain medicine is making you nauseous, sleepy, dizzy, or constipated.  You are unable to manage your pain at home. Summary  Cancer pain is different for everyone. Work with your cancer  care team to develop the pain management plan that is best for you.  Making lifestyle changes and following home care instructions can improve your quality of life while managing cancer pain.  Lifestyle changes include eating a nutritious diet, getting regular exercise, and managing stress.  Carefully follow instructions for taking your medicine as told by your cancer care team.  Let your cancer care team know if your pain is not controlled at home. This information is not intended to replace advice given to you by your health care provider. Make sure you discuss any questions you have with your health care provider. Document Revised: 06/22/2019 Document Reviewed: 06/03/2018 Elsevier Patient Education  [place].   Pain Relief Before and After Surgery  Pain relief is an important part of your overall care before, during, and after surgery. You and your health care provider will work together to make a plan to manage pain that you have before surgery (preoperative) and after surgery (postoperative). Addressing pain before surgery lessens the pain that you will have after surgery. Make sure that you fully understand and agree with your pain relief plan. If you have questions or concerns, it is important to discuss them with your health care provider. If you have pain that is not controlled by medicine, tell your health care provider. Severe pain after surgery may:  Prevent sleep.  Decrease your ability to breathe deeply and to cough. This can result in pneumonia or upper airway infections.  Cause your heart to beat more quickly.  Cause your blood pressure to be higher.  Increase your risk for stomach and digestive problems.  Slow down wound healing.  Lead to depression, anxiety, and feelings of helplessness. Your health care provider may use more than one method at a time to help relieve your pain. Using this approach may allow you to eat, move around, and possibly leave the  hospital sooner. What are options for managing pain before and after surgery? Oral pain medicines Pain medicines taken by mouth (orally) include:  Non-narcotic medicines: ? Acetaminophen. ? NSAIDs,  such as ibuprofen and naproxen.  Muscle relaxants. These may relieve pain caused by muscle spasms.  Anticonvulsants. These medicines are usually used to treat seizures. They may help to lessen nerve pain.  Opioids. These medicines relieve pain by binding to pain receptors in the brain and spinal cord (narcotic pain medicines). Opioids may help relieve short-term (acute) postoperative pain that is moderate to moderately severe. ? Opioids are often combined with non-narcotic medicines to improve pain relief, lower the risk of side effects, and lower the chance of addiction. ? To help prevent addiction, opioids are given for short periods of time in careful doses. If you follow instructions from your health care provider and you do not have a history of substance abuse, your risk of becoming addicted to opioids is low. Some of these medicines may be available in injectable form. They may be given through an IV if you are unable to eat or drink. As-needed pain control You can receive pain medicine when you need it, through an IV or as a pill or liquid. When you tell your health care provider that you are having pain, he or she will give you the proper pain medicine. Medicine that numbs an area You may be given pain medicine that numbs an area (local anesthetic):  As an injection near your painful area (local infiltration).  As an injection near the nerve that provides feeling to a specific part of your body (peripheral nerve block).  As an injection in your spine (spinal block).  Through a local anesthetic reservoir pump. For this method, one or more catheters are inserted into your incision at the end of your procedure. These catheters are connected to a device that is filled with a non-narcotic  pain medicine. Medicine gradually empties into your incision area over the next several days. Continuous epidural pain control With this method, you receive pain medicine through a small, thin tube (catheter) that is inserted into your back, near your spinal cord. Medicine flows through the catheter to lessen pain in areas of your body that are below the catheter. The catheter is usually put into the back shortly before surgery. It may be left in until you can eat, take medicine by mouth, pass urine, and have a bowel movement. This method may be recommended if you are having surgery on your abdomen, hip area, or legs. This method of pain relief may help you heal faster because you may be able to do these things sooner:  Regain normal bowel and bladder function.  Return to eating.  Get up and walk. IV patient-controlled analgesia (PCA) pump With this method, you receive pain medicine through an IV that is connected to a PCA pump. The PCA pump gives you a specific amount of medicine when you push a button. This lets you control how much medicine you receive. You are the only person who should push this button. The pump is set up so that you cannot accidentally give yourself too much medicine. You will be able to start using your PCA pump in the recovery room after your procedure. Tell your health care provider:  If you are having too much pain.  If you cannot push the button.  If you are feeling too sleepy or nauseous. Other pain control methods Other methods of pain relief after surgery include:  Heat and cold therapy.  Massage.  Topical analgesics. These are patches, creams, and gels that can be applied on the skin.  Steroid medicines. These medicines may be  given to lessen swelling.  Physical therapy. A physical therapist will work with you to meet goals, such as feeling and functioning better. Physical therapy usually includes specific exercises that are tailored to your  needs.  Transcutaneous electrical nerve stimulation (TENS). This method sends electrical signals through the skin to interrupt pain signals.  Cognitive behavioral therapy (CBT). This therapy helps you learn coping skills for dealing with pain. What are some questions to ask my health care provider?  What pain relief options would be best for me?  What are the risks of each option?  What are the benefits of each option?  How long will I need pain relief after surgery? Summary  A plan to manage pain that you may have before surgery (preoperative) and after surgery (postoperative) is an important part of your overall care.  Pain management options include medicines and non-medical therapies, such as physical therapy, massage, and heat or cold therapy.  Pain management medicines include opioids and non-narcotic medicines such as NSAIDs, steroids, or local anesthetics.  Pain medicines can have side effects. Side effects of opioids include constipation, nausea, excessive sleepiness, and risk of addiction. Your health care provider will work with you to prevent or manage these side effects and risks. This information is not intended to replace advice given to you by your health care provider. Make sure you discuss any questions you have with your health care provider. Document Revised: 06/22/2019 Document Reviewed: 01/09/2017 Elsevier Patient Education  [place].

## 2020-04-20 NOTE — Discharge Summary (Addendum)
Physician Discharge Summary  [name] [name] CZY:[id number] DOB: Jun 04, 1946 DOA: 04/14/2020  PCP: Libby Maw, MD  Admit date: 04/14/2020 Discharge date: 04/20/2020  Admitted From: home Disposition:  [place] Discharging physician: [name], MD  Recommendations for Outpatient Follow-up:  1. Adjust BP regimen further as needed 2. Adjust Lantus further as needed  Patient discharged to [place] in Discharge Condition: stable CODE STATUS: Full Diet recommendation:  Diet Orders (From admission, onward)    Start     Ordered   04/20/20 0000  Diet Carb Modified        04/20/20 1104   04/19/20 0000  Diet Carb Modified        04/19/20 1215   04/15/20 0834  Diet Carb Modified Fluid consistency: Thin; Room service appropriate? Yes; Fluid restriction: 1800 mL Fluid  Diet effective now       Question Answer Comment  Diet-HS Snack? Nothing   Calorie Level Medium 1600-2000   Fluid consistency: Thin   Room service appropriate? Yes   Fluid restriction: 1800 mL Fluid      04/15/20 0932          Hospital Course: [name] [name] is a 74 y.o. male with medical history significant of type 2 diabetes (uncontrolled), history of CVA with mild cognitive impairment, hypertension, chronic kidney disease stage IV who was recently admitted from 6/12-6/17 with acute encephalopathy related to hyperosmolar hyperglycemia.   At that time his Lantus was reduced to 6 units/day with sliding scale for meal coverage due to ongoing hypoglycemic events.  At that admission he was discharged to a skilled nursing facility due to a complicated social situation.   He has ongoing cognitive impairment and is being cared for by 2 sisters who are also responsible for caring for his 51 year old mother and a brother with Down syndrome.    For this hospitalization, he was found to be in DKA and started on adequate protocol.  He responded well and his DKA resolved.  He was transitioned back to sliding scale insulin and  Lantus. He also had acute on chronic renal failure which also responded to IV fluids.  His case was discussed with family especially his son [name] [name].   His blood pressure also remained above goal and his hydralazine was uptitrated as needed.  He may need further titration of other antihypertensives for adequate control.   Uncontrolled type 2 diabetes mellitus with diabetic nephropathy, with long-term current use of insulin ([name]) -A1c 10.3% in June 2021 -DKA has now resolved -Continue on Lantus and SSI  DKA, type 2 ([employer]) - resolved with treatment   Acute renal failure superimposed on stage 4 chronic kidney disease ([place]) - responded well to IVF - educated patient's son on progression of CKD if DM not well controlled  Acute metabolic encephalopathy -Due to DKA.  Mentation has returned to normal  Hyperkalemia -Resolved with treatment of acute on chronic renal failure  Type 2 diabetes mellitus with hyperlipidemia ([name]) -Last A1c 10.3% in June 2021 -Continue SSI and Lantus -Early morning hypoglycemia noted while on Lantus 6 units daily; dose was decreased down to 4 units daily and still may need further adjustment, but glucose levels remained within goal on this dose  Hypertension -Continue amlodipine and hydralazine; hydralazine was uptitrated during hospitalization to max dose.  If blood pressure still uncontrolled, may need third agent    The patient's chronic medical conditions were treated accordingly per the patient's home medication regimen except as noted.  On day of discharge, patient was  felt deemed stable for discharge. Patient/family member advised to call PCP or come back to ER if needed.   Discharge Diagnoses:   Principal Diagnosis: <principal problem not specified>  Active Hospital Problems   Diagnosis Date Noted  . Uncontrolled type 2 diabetes mellitus with diabetic nephropathy, with long-term current use of insulin ([place]) 08/12/2015    Priority: High  . Type 2  diabetes mellitus with hyperlipidemia ([place]) 06/27/2017  . Hypertension 06/27/2017    Resolved Hospital Problems   Diagnosis Date Noted Date Resolved  . DKA, type 2 ([place]) 08/01/2017 04/18/2020  . Hyperkalemia 01/16/2017 04/18/2020  . Acute metabolic encephalopathy 27/25/3664 04/18/2020  . Acute renal failure superimposed on stage 4 chronic kidney disease ([place])  04/19/2020    Discharge Instructions    Diet Carb Modified   Complete by: As directed    Diet Carb Modified   Complete by: As directed    Increase activity slowly   Complete by: As directed    Increase activity slowly   Complete by: As directed      Allergies as of 04/20/2020   No Known Allergies     Medication List    STOP taking these medications   insulin lispro 100 UNIT/ML KwikPen Commonly known as: HumaLOG KwikPen     TAKE these medications   Accu-Chek Guide w/Device Kit 1 Device by Does not apply route QID.   amLODipine 10 MG tablet Commonly known as: NORVASC TAKE 1 TABLET ONCE DAILY.   atorvastatin 40 MG tablet Commonly known as: LIPITOR Take 1 tablet (40 mg total) by mouth daily.   cyanocobalamin 1000 MCG tablet Take 1 tablet (1,000 mcg total) by mouth daily.   furosemide 20 MG tablet Commonly known as: LASIX Take 1 tablet (20 mg total) by mouth daily.   glucose blood test strip Commonly known as: True Metrix Blood Glucose Test CHECK BLOOD SUGAR UP TO 4 TIMES A DAY. What changed:   how much to take  how to take this  when to take this  additional instructions   glucose blood test strip Commonly known as: Accu-Chek Guide Use as instructed What changed: Another medication with the same name was changed. Make sure you understand how and when to take each.   Accu-Chek Guide test strip Generic drug: glucose blood USE AS DIRECTED What changed: Another medication with the same name was changed. Make sure you understand how and when to take each.   hydrALAZINE 100 MG tablet Commonly  known as: APRESOLINE Take 1 tablet (100 mg total) by mouth 3 (three) times daily. What changed:   medication strength  how much to take   insulin aspart 100 UNIT/ML injection Commonly known as: novoLOG Inject 1-9 Units into the skin 4 (four) times daily -  before meals and at bedtime. CBG 121 - 150: 1 unit, CBG 151 - 200: 2 units, CBG 201 - 250: 3 units, CBG 251 - 300: 5 units, CBG 301 - 350: 7 units, CBG 351 - 400  9 units, CBG > 400 call MD   insulin glargine 100 UNIT/ML injection Commonly known as: LANTUS Inject 0.04 mLs (4 Units total) into the skin at bedtime. What changed: how much to take   Insulin Pen Needle 32G X 4 MM Misc 1 Device by Does not apply route in the morning, at noon, in the evening, and at bedtime.   Insulin Syringe-Needle U-100 31G X 5/16" 0.3 ML Misc Commonly known as: BD Insulin Syringe U/F Inject 1 each as directed  2 (two) times daily. as directed   levothyroxine 25 MCG tablet Commonly known as: SYNTHROID Take 25 mcg by mouth daily before breakfast.   sodium bicarbonate 650 MG tablet Take 1 tablet (650 mg total) by mouth 2 (two) times daily.   Vitamin D (Ergocalciferol) 1.25 MG (50000 UNIT) Caps capsule Commonly known as: DRISDOL Take 50,000 Units by mouth every 7 (seven) days. Thursdays       Contact information for follow-up providers    Libby Maw, MD. Schedule an appointment as soon as possible for a visit in 1 week(s).   Specialty: Family Medicine Contact information: [place] [address] [phone]            Contact information for after-discharge care    Destination    [facility] .   Service: Skilled Nursing Contact information: [place] [phone]                 No Known Allergies    Procedures/Studies: CT Code Stroke CTA Head W/WO contrast  Result Date: 03/24/2020 CLINICAL DATA:  Left-sided weakness  EXAM: CT ANGIOGRAPHY HEAD AND NECK TECHNIQUE: Multidetector CT imaging of the head and neck was performed using the standard protocol during bolus administration of intravenous contrast. Multiplanar CT image reconstructions and MIPs were obtained to evaluate the vascular anatomy. Carotid stenosis measurements (when applicable) are obtained utilizing NASCET criteria, using the distal internal carotid diameter as the denominator. CONTRAST:  Dose is not yet known COMPARISON:  Noncontrast head CT from earlier today FINDINGS: CTA NECK FINDINGS Aortic arch: Atherosclerotic plaque.  Three vessel branching. Right carotid system: Tortuous vessels. Overall mild atherosclerosis. No flow limiting stenosis or ulceration. Left carotid system: Mild mainly calcified plaque at the bifurcation. ICA tortuosity at the skull base. No flow limiting stenosis or ulceration. Vertebral arteries: No proximal subclavian stenosis. Limited assessment of vertebral origins due to image blurring. Bilateral vertebral tortuosity without flow limiting stenosis or beading. Skeleton: Advanced degenerative disc narrowing and ridging with C2-3 anterolisthesis. Other neck: No acute finding. Upper chest: Negative Review of the MIP images confirms the above findings CTA HEAD FINDINGS Anterior circulation: Atheromatous calcification along the carotid siphons. Mild scattered atheromatous irregularity of bilateral MCA branches. No branch occlusion, beading, or aneurysm. Posterior circulation: Bilateral V4 segment atheromatous plaque. The vertebrobasilar arteries are diffusely patent. Moderate right distal P2 segment narrowing. Left PCA branch narrowings. Venous sinuses: Unremarkable in the arterial phase Anatomic variants: None significant Review of the MIP images confirms the above findings IMPRESSION: 1. Negative for large vessel occlusion or other emergent finding. 2. Cervical atherosclerosis without flow limiting stenosis of major neck vessels. 3.  Intracranial atherosclerosis, most notably a moderate right P2 segment stenosis. Electronically Signed   By: Monte Fantasia M.D.   On: 03/24/2020 12:38   DG Chest 1 View  Result Date: 03/24/2020 CLINICAL DATA:  Go stroke, line placement EXAM: CHEST  1 VIEW COMPARISON:  11/28/2019 FINDINGS: Left central line tip is in the SVC. No pneumothorax. Mild cardiomegaly and vascular congestion. No confluent opacities, effusions or edema. No acute bony abnormality. IMPRESSION: Cardiomegaly, vascular congestion. Left central line tip in the SVC.  No pneumothorax. Electronically Signed   By: Rolm Baptise M.D.   On: 03/24/2020 21:51   DG Tibia/Fibula Right  Result Date: 04/15/2020 CLINICAL DATA:  Recent fall with right lower leg pain. EXAM: RIGHT TIBIA AND FIBULA - 2 VIEW COMPARISON:  None. FINDINGS: Mild diffuse decreased bone  mineralization. No evidence of acute fracture or dislocation. IMPRESSION: No acute findings. Electronically Signed   By: Marin Olp M.D.   On: 04/15/2020 11:10   DG Abd 1 View  Result Date: 03/25/2020 CLINICAL DATA:  High creatinine EXAM: ABDOMEN - 1 VIEW COMPARISON:  None. FINDINGS: The bowel gas pattern is normal. No radio-opaque calculi or other significant radiographic abnormality are seen. IMPRESSION: Nonobstructive pattern of bowel gas. No free air in the abdomen on supine radiograph. No radiopaque calculi noted radiographically; CT is more sensitive for the detection of urinary tract calculi if clinically suspected. Electronically Signed   By: Eddie Candle M.D.   On: 03/25/2020 11:40   CT Head Wo Contrast  Result Date: 04/14/2020 CLINICAL DATA:  Status post fall.  Initial encounter. EXAM: CT HEAD WITHOUT CONTRAST TECHNIQUE: Contiguous axial images were obtained from the base of the skull through the vertex without intravenous contrast. COMPARISON:  Head CT scan 03/24/2020. FINDINGS: Brain: No evidence of acute infarction, hemorrhage, hydrocephalus, extra-axial collection or mass  lesion/mass effect. Atrophy and chronic microvascular ischemic change again seen. Vascular: No hyperdense vessel or unexpected calcification. Skull: Intact.  No focal lesion. Sinuses/Orbits: Negative. Other: None. IMPRESSION: No acute abnormality. Atrophy and chronic microvascular ischemic change. Electronically Signed   By: Inge Rise M.D.   On: 04/14/2020 17:28   CT Code Stroke CTA Neck W/WO contrast  Result Date: 03/24/2020 CLINICAL DATA:  Left-sided weakness EXAM: CT ANGIOGRAPHY HEAD AND NECK TECHNIQUE: Multidetector CT imaging of the head and neck was performed using the standard protocol during bolus administration of intravenous contrast. Multiplanar CT image reconstructions and MIPs were obtained to evaluate the vascular anatomy. Carotid stenosis measurements (when applicable) are obtained utilizing NASCET criteria, using the distal internal carotid diameter as the denominator. CONTRAST:  Dose is not yet known COMPARISON:  Noncontrast head CT from earlier today FINDINGS: CTA NECK FINDINGS Aortic arch: Atherosclerotic plaque.  Three vessel branching. Right carotid system: Tortuous vessels. Overall mild atherosclerosis. No flow limiting stenosis or ulceration. Left carotid system: Mild mainly calcified plaque at the bifurcation. ICA tortuosity at the skull base. No flow limiting stenosis or ulceration. Vertebral arteries: No proximal subclavian stenosis. Limited assessment of vertebral origins due to image blurring. Bilateral vertebral tortuosity without flow limiting stenosis or beading. Skeleton: Advanced degenerative disc narrowing and ridging with C2-3 anterolisthesis. Other neck: No acute finding. Upper chest: Negative Review of the MIP images confirms the above findings CTA HEAD FINDINGS Anterior circulation: Atheromatous calcification along the carotid siphons. Mild scattered atheromatous irregularity of bilateral MCA branches. No branch occlusion, beading, or aneurysm. Posterior circulation:  Bilateral V4 segment atheromatous plaque. The vertebrobasilar arteries are diffusely patent. Moderate right distal P2 segment narrowing. Left PCA branch narrowings. Venous sinuses: Unremarkable in the arterial phase Anatomic variants: None significant Review of the MIP images confirms the above findings IMPRESSION: 1. Negative for large vessel occlusion or other emergent finding. 2. Cervical atherosclerosis without flow limiting stenosis of major neck vessels. 3. Intracranial atherosclerosis, most notably a moderate right P2 segment stenosis. Electronically Signed   By: Monte Fantasia M.D.   On: 03/24/2020 12:38   DG Chest Port 1 View  Result Date: 04/14/2020 CLINICAL DATA:  Hyperglycemia EXAM: PORTABLE CHEST 1 VIEW COMPARISON:  March 24, 2020 FINDINGS: The mediastinal contour and cardiac silhouette are stable. There is no focal infiltrate, pulmonary edema, or pleural effusion. The visualized bony structures are unremarkable. IMPRESSION: No active disease. Electronically Signed   By: Abelardo Diesel M.D.   On:  04/14/2020 14:31   CT HEAD CODE STROKE WO CONTRAST  Result Date: 03/24/2020 CLINICAL DATA:  Code stroke. Right-sided gaze and left-sided weakness EXAM: CT HEAD WITHOUT CONTRAST TECHNIQUE: Contiguous axial images were obtained from the base of the skull through the vertex without intravenous contrast. COMPARISON:  11/22/2019 FINDINGS: Brain: No evidence of acute infarction, hemorrhage, hydrocephalus, extra-axial collection or mass lesion/mass effect. Chronic small vessel ischemia in the cerebral white matter to a moderate degree. Moderate brain atrophy Vascular: Atherosclerotic calcification Skull: Normal. Negative for fracture or focal lesion. Sinuses/Orbits: No acute finding. Other: These results were communicated to Dr. Leonel Ramsay at 11:51 amon 6/12/2021by text page via the [facility] messaging system. ASPECTS [facility] Stroke Program Early CT Score) - Ganglionic level infarction (caudate, lentiform nuclei,  internal capsule, insula, M1-M3 cortex): 7 - Supraganglionic infarction (M4-M6 cortex): 3 Total score (0-10 with 10 being normal): 10 IMPRESSION: Senescent changes without acute finding. Electronically Signed   By: Monte Fantasia M.D.   On: 03/24/2020 11:53    Discharge Exam: BP 135/80 (BP Location: Right Arm)   Pulse 81   Temp 98.9 F (37.2 C) (Oral)   Resp 20   Ht 5' 9.5" (1.765 m)   Wt 70.8 kg   SpO2 96%   BMI 22.71 kg/m  General appearance: alert, no distress and agitated Head: Normocephalic, without obvious abnormality Eyes: EOMI Lungs: clear to auscultation bilaterally Heart: regular rate and rhythm and S1, S2 normal Abdomen: normal findings: bowel sounds normal and soft, non-tender Extremities: no edema Skin: mobility and turgor normal Neurologic: Grossly norma  The results of significant diagnostics from this hospitalization (including imaging, microbiology, ancillary and laboratory) are listed below for reference.     Microbiology: Recent Results (from the past 240 hour(s))  SARS Coronavirus 2 by RT PCR (hospital order, performed in [facility] hospital lab) Nasopharyngeal Nasopharyngeal Swab     Status: None   Collection Time: 04/14/20  1:44 PM   Specimen: Nasopharyngeal Swab  Result Value Ref Range Status   SARS Coronavirus 2 NEGATIVE NEGATIVE Final    Comment: (NOTE) SARS-CoV-2 target nucleic acids are NOT DETECTED.  The SARS-CoV-2 RNA is generally detectable in upper and lower respiratory specimens during the acute phase of infection. The lowest concentration of SARS-CoV-2 viral copies this assay can detect is 250 copies / mL. A negative result does not preclude SARS-CoV-2 infection and should not be used as the sole basis for treatment or other patient management decisions.  A negative result may occur with improper specimen collection / handling, submission of specimen other than nasopharyngeal swab, presence of viral mutation(s) within the areas targeted  by this assay, and inadequate number of viral copies (<250 copies / mL). A negative result must be combined with clinical observations, patient history, and epidemiological information.  Fact Sheet for Patients:   StrictlyIdeas.no  Fact Sheet for Healthcare Providers: BankingDealers.co.za  This test is not yet approved or  cleared by the [place] FDA and has been authorized for detection and/or diagnosis of SARS-CoV-2 by FDA under an Emergency Use Authorization (EUA).  This EUA will remain in effect (meaning this test can be used) for the duration of the COVID-19 declaration under Section 564(b)(1) of the Act, 21 U.S.C. section 360bbb-3(b)(1), unless the authorization is terminated or revoked sooner.  Performed at [facility], [place] [address], [place], [place] [address]   MRSA PCR Screening     Status: None   Collection Time: 04/14/20  9:57 PM   Specimen: Nasal Mucosa;  Nasopharyngeal  Result Value Ref Range Status   MRSA by PCR NEGATIVE NEGATIVE Final    Comment:        The GeneXpert MRSA Assay (FDA approved for NASAL specimens only), is one component of a comprehensive MRSA colonization surveillance program. It is not intended to diagnose MRSA infection nor to guide or monitor treatment for MRSA infections. Performed at [facility], [place] [address], [place], [place] [address]   SARS Coronavirus 2 by RT PCR (hospital order, performed in [facility] hospital lab) Nasopharyngeal Nasopharyngeal Swab     Status: None   Collection Time: 04/20/20 12:13 PM   Specimen: Nasopharyngeal Swab  Result Value Ref Range Status   SARS Coronavirus 2 NEGATIVE NEGATIVE Final    Comment: (NOTE) SARS-CoV-2 target nucleic acids are NOT DETECTED.  The SARS-CoV-2 RNA is generally detectable in upper and lower respiratory specimens during the acute phase of infection. The lowest concentration of SARS-CoV-2  viral copies this assay can detect is 250 copies / mL. A negative result does not preclude SARS-CoV-2 infection and should not be used as the sole basis for treatment or other patient management decisions.  A negative result may occur with improper specimen collection / handling, submission of specimen other than nasopharyngeal swab, presence of viral mutation(s) within the areas targeted by this assay, and inadequate number of viral copies (<250 copies / mL). A negative result must be combined with clinical observations, patient history, and epidemiological information.  Fact Sheet for Patients:   StrictlyIdeas.no  Fact Sheet for Healthcare Providers: BankingDealers.co.za  This test is not yet approved or  cleared by the [place] FDA and has been authorized for detection and/or diagnosis of SARS-CoV-2 by FDA under an Emergency Use Authorization (EUA).  This EUA will remain in effect (meaning this test can be used) for the duration of the COVID-19 declaration under Section 564(b)(1) of the Act, 21 U.S.C. section 360bbb-3(b)(1), unless the authorization is terminated or revoked sooner.  Performed at [facility], [place] [address] [place], [place] [address]      Labs: BNP (last 3 results) No results for input(s): BNP in the last 8760 hours. Basic Metabolic Panel: Recent Labs  Lab 04/15/20 0707 04/15/20 0823 04/16/20 0327 04/17/20 0306 04/18/20 0518 04/19/20 0541 04/20/20 0457  NA   < >  --  135 138 138 142 140  K   < >  --  4.7 4.6 4.2 4.0 4.5  CL   < >  --  108 110 114* 115* 113*  CO2   < >  --  19* 17* 18* 20* 21*  GLUCOSE   < >  --  196* 260* 159* 60* 207*  BUN   < >  --  60* 51* 40* 32* 35*  CREATININE   < >  --  4.63* 4.28* 3.35* 2.99* 3.08*  CALCIUM   < >  --  7.9* 7.8* 7.9* 8.2* 7.9*  MG  --  2.3  --  2.2 1.9 1.8 1.9  PHOS  --  4.6  --  4.1 4.2  --   --    < > = values in this interval not  displayed.   Liver Function Tests: Recent Labs  Lab 04/15/20 0834 04/17/20 0306 04/18/20 0518  AST 23 17 14*  ALT '15 16 14  ' ALKPHOS 67 74 65  BILITOT 0.5 0.5 0.5  PROT 5.7* 5.5* 5.3*  ALBUMIN 2.7* 2.4* 2.4*   No results for input(s): LIPASE,  AMYLASE in the last 168 hours. No results for input(s): AMMONIA in the last 168 hours. CBC: Recent Labs  Lab 04/16/20 0327 04/17/20 0306 04/18/20 0518 04/19/20 0541 04/20/20 0457  WBC 9.1 6.0 5.4 4.8 5.0  NEUTROABS 5.1 3.1 2.2 2.1 2.3  HGB 8.3* 8.6* 8.3* 8.9* 8.1*  HCT 26.8* 27.4* 26.9* 28.4* 26.3*  MCV 94.7 92.6 93.7 92.8 92.6  PLT 205 202 189 205 176   Cardiac Enzymes: Recent Labs  Lab 04/14/20 1542  CKTOTAL 225   BNP: Invalid input(s): POCBNP CBG: Recent Labs  Lab 04/19/20 1106 04/19/20 1645 04/19/20 2124 04/20/20 0740 04/20/20 1144  GLUCAP 296* 184* 109* 214* 338*   D-Dimer No results for input(s): DDIMER in the last 72 hours. Hgb A1c No results for input(s): HGBA1C in the last 72 hours. Lipid Profile No results for input(s): CHOL, HDL, LDLCALC, TRIG, CHOLHDL, LDLDIRECT in the last 72 hours. Thyroid function studies No results for input(s): TSH, T4TOTAL, T3FREE, THYROIDAB in the last 72 hours.  Invalid input(s): FREET3 Anemia work up No results for input(s): VITAMINB12, FOLATE, FERRITIN, TIBC, IRON, RETICCTPCT in the last 72 hours. Urinalysis    Component Value Date/Time   COLORURINE YELLOW 03/24/2020 1542   APPEARANCEUR CLEAR 03/24/2020 1542   LABSPEC 1.017 03/24/2020 1542   PHURINE 5.0 03/24/2020 1542   GLUCOSEU >=500 (A) 03/24/2020 1542   GLUCOSEU >=1000 (A) 12/08/2019 1139   HGBUR SMALL (A) 03/24/2020 1542   BILIRUBINUR NEGATIVE 03/24/2020 1542   BILIRUBINUR Negative 11/09/2019 0910   KETONESUR 20 (A) 03/24/2020 1542   PROTEINUR >=300 (A) 03/24/2020 1542   UROBILINOGEN 0.2 12/08/2019 1139   NITRITE NEGATIVE 03/24/2020 1542   LEUKOCYTESUR NEGATIVE 03/24/2020 1542   Sepsis Labs Invalid  input(s): PROCALCITONIN,  WBC,  LACTICIDVEN Microbiology Recent Results (from the past 240 hour(s))  SARS Coronavirus 2 by RT PCR (hospital order, performed in [place] hospital lab) Nasopharyngeal Nasopharyngeal Swab     Status: None   Collection Time: 04/14/20  1:44 PM   Specimen: Nasopharyngeal Swab  Result Value Ref Range Status   SARS Coronavirus 2 NEGATIVE NEGATIVE Final    Comment: (NOTE) SARS-CoV-2 target nucleic acids are NOT DETECTED.  The SARS-CoV-2 RNA is generally detectable in upper and lower respiratory specimens during the acute phase of infection. The lowest concentration of SARS-CoV-2 viral copies this assay can detect is 250 copies / mL. A negative result does not preclude SARS-CoV-2 infection and should not be used as the sole basis for treatment or other patient management decisions.  A negative result may occur with improper specimen collection / handling, submission of specimen other than nasopharyngeal swab, presence of viral mutation(s) within the areas targeted by this assay, and inadequate number of viral copies (<250 copies / mL). A negative result must be combined with clinical observations, patient history, and epidemiological information.  Fact Sheet for Patients:   StrictlyIdeas.no  Fact Sheet for Healthcare Providers: BankingDealers.co.za  This test is not yet approved or  cleared by the [place] FDA and has been authorized for detection and/or diagnosis of SARS-CoV-2 by FDA under an Emergency Use Authorization (EUA).  This EUA will remain in effect (meaning this test can be used) for the duration of the COVID-19 declaration under Section 564(b)(1) of the Act, 21 U.S.C. section 360bbb-3(b)(1), unless the authorization is terminated or revoked sooner.  Performed at [facility], [place] [address], [place], [place] [address]   MRSA PCR Screening     Status: None   Collection  Time: 04/14/20  9:57 PM   Specimen: Nasal Mucosa; Nasopharyngeal  Result Value Ref Range Status   MRSA by PCR NEGATIVE NEGATIVE Final    Comment:        The GeneXpert MRSA Assay (FDA approved for NASAL specimens only), is one component of a comprehensive MRSA colonization surveillance program. It is not intended to diagnose MRSA infection nor to guide or monitor treatment for MRSA infections. Performed at [facility], [place] [address] [place], [place] [address]   SARS Coronavirus 2 by RT PCR (hospital order, performed in [facility] hospital lab) Nasopharyngeal Nasopharyngeal Swab     Status: None   Collection Time: 04/20/20 12:13 PM   Specimen: Nasopharyngeal Swab  Result Value Ref Range Status   SARS Coronavirus 2 NEGATIVE NEGATIVE Final    Comment: (NOTE) SARS-CoV-2 target nucleic acids are NOT DETECTED.  The SARS-CoV-2 RNA is generally detectable in upper and lower respiratory specimens during the acute phase of infection. The lowest concentration of SARS-CoV-2 viral copies this assay can detect is 250 copies / mL. A negative result does not preclude SARS-CoV-2 infection and should not be used as the sole basis for treatment or other patient management decisions.  A negative result may occur with improper specimen collection / handling, submission of specimen other than nasopharyngeal swab, presence of viral mutation(s) within the areas targeted by this assay, and inadequate number of viral copies (<250 copies / mL). A negative result must be combined with clinical observations, patient history, and epidemiological information.  Fact Sheet for Patients:   StrictlyIdeas.no  Fact Sheet for Healthcare Providers: BankingDealers.co.za  This test is not yet approved or  cleared by the [place] FDA and has been authorized for detection and/or diagnosis of SARS-CoV-2 by FDA under an Emergency Use  Authorization (EUA).  This EUA will remain in effect (meaning this test can be used) for the duration of the COVID-19 declaration under Section 564(b)(1) of the Act, 21 U.S.C. section 360bbb-3(b)(1), unless the authorization is terminated or revoked sooner.  Performed at [facility], [place] [address], [place], [place] [address]      Time coordinating discharge: Over 68 minutes    [name], MD  [facility] 04/20/2020, 2:08 PM Pager: Secure chat  If 7PM-7AM, please contact night-coverage [email or web address] Password [email or web address]

## 2020-04-20 NOTE — TOC Transition Note (Signed)
Transition of Care [facility]) - CM/SW Discharge Note   Patient Details  Name: [name] [name] MRN: [id number] Date of Birth: 10-Jun-1946  [facility] CM/SW Contact:  Shade Flood, LCSW Phone Number: 04/20/2020, 11:14 AM   Clinical Narrative:     Pt stable for dc. Spoke with pt's sister, [name], regarding dc plans. Updated [name] on pt's refusal yesterday to go to SNF as planned. She spoke with pt and now he is in agreement again. Updated MD and RN.   DC clinical sent electronically. Pt going to room 117 and number for report is [phone].  DC envelope will be at the RN station. Will call PTAR when RN ready.  There are no other TOC needs for dc.  Final next level of care: Skilled Nursing Facility Barriers to Discharge: Barriers Resolved   Patient Goals and CMS Choice Patient states their goals for this hospitalization and ongoing recovery are:: to go home again CMS Medicare.gov Compare Post Acute Care list provided to:: Patient    Discharge Placement   Existing PASRR number confirmed : 04/16/20          Patient chooses bed at: Other - please specify in the comment section below: ([facility]) Patient to be transferred to facility by: [facility] Name of family member notified: [name] Patient and family notified of of transfer: 04/20/20  Discharge Plan and Services   Discharge Planning Services: CM Consult Post Acute Care Choice: [facility]                               Social Determinants of Health (SDOH) Interventions     Readmission Risk Interventions Readmission Risk Prevention Plan 04/20/2020  Transportation Screening Complete  Medication Review Press photographer) Complete  SW Recovery Care/Counseling Consult Complete  Palliative Care Screening Complete  Skilled Nursing Facility Complete  Some recent data might be hidden

## 2020-04-22 ENCOUNTER — Inpatient Hospital Stay (HOSPITAL_COMMUNITY)
Admission: EM | Admit: 2020-04-22 | Discharge: 2020-04-28 | DRG: 638 | Disposition: A | Discharge status: Skilled Nursing Facility | Payer: Medicare Other | Attending: Internal Medicine | Admitting: Internal Medicine

## 2020-04-22 ENCOUNTER — Other Ambulatory Visit: Payer: Self-pay

## 2020-04-22 ENCOUNTER — Emergency Department (HOSPITAL_COMMUNITY): Payer: Medicare Other

## 2020-04-22 DIAGNOSIS — Z83438 Family history of other disorder of lipoprotein metabolism and other lipidemia: Secondary | ICD-10-CM

## 2020-04-22 DIAGNOSIS — Z66 Do not resuscitate: Secondary | ICD-10-CM | POA: Diagnosis present

## 2020-04-22 DIAGNOSIS — E1169 Type 2 diabetes mellitus with other specified complication: Secondary | ICD-10-CM | POA: Diagnosis present

## 2020-04-22 DIAGNOSIS — Z79899 Other long term (current) drug therapy: Secondary | ICD-10-CM

## 2020-04-22 DIAGNOSIS — D696 Thrombocytopenia, unspecified: Secondary | ICD-10-CM | POA: Diagnosis present

## 2020-04-22 DIAGNOSIS — E1110 Type 2 diabetes mellitus with ketoacidosis without coma: Principal | ICD-10-CM | POA: Diagnosis present

## 2020-04-22 DIAGNOSIS — Z8673 Personal history of transient ischemic attack (TIA), and cerebral infarction without residual deficits: Secondary | ICD-10-CM

## 2020-04-22 DIAGNOSIS — Z79890 Hormone replacement therapy (postmenopausal): Secondary | ICD-10-CM

## 2020-04-22 DIAGNOSIS — N184 Chronic kidney disease, stage 4 (severe): Secondary | ICD-10-CM | POA: Diagnosis present

## 2020-04-22 DIAGNOSIS — E785 Hyperlipidemia, unspecified: Secondary | ICD-10-CM | POA: Diagnosis present

## 2020-04-22 DIAGNOSIS — E860 Dehydration: Secondary | ICD-10-CM | POA: Diagnosis present

## 2020-04-22 DIAGNOSIS — R739 Hyperglycemia, unspecified: Secondary | ICD-10-CM | POA: Diagnosis not present

## 2020-04-22 DIAGNOSIS — E1010 Type 1 diabetes mellitus with ketoacidosis without coma: Secondary | ICD-10-CM

## 2020-04-22 DIAGNOSIS — Z8279 Family history of other congenital malformations, deformations and chromosomal abnormalities: Secondary | ICD-10-CM

## 2020-04-22 DIAGNOSIS — Z20822 Contact with and (suspected) exposure to covid-19: Secondary | ICD-10-CM | POA: Diagnosis present

## 2020-04-22 DIAGNOSIS — D631 Anemia in chronic kidney disease: Secondary | ICD-10-CM | POA: Diagnosis present

## 2020-04-22 DIAGNOSIS — I129 Hypertensive chronic kidney disease with stage 1 through stage 4 chronic kidney disease, or unspecified chronic kidney disease: Secondary | ICD-10-CM | POA: Diagnosis present

## 2020-04-22 DIAGNOSIS — Z87891 Personal history of nicotine dependence: Secondary | ICD-10-CM

## 2020-04-22 DIAGNOSIS — N179 Acute kidney failure, unspecified: Secondary | ICD-10-CM | POA: Diagnosis present

## 2020-04-22 DIAGNOSIS — E875 Hyperkalemia: Secondary | ICD-10-CM | POA: Diagnosis present

## 2020-04-22 DIAGNOSIS — E039 Hypothyroidism, unspecified: Secondary | ICD-10-CM | POA: Diagnosis present

## 2020-04-22 DIAGNOSIS — I152 Hypertension secondary to endocrine disorders: Secondary | ICD-10-CM | POA: Diagnosis present

## 2020-04-22 DIAGNOSIS — Z8249 Family history of ischemic heart disease and other diseases of the circulatory system: Secondary | ICD-10-CM

## 2020-04-22 DIAGNOSIS — D6959 Other secondary thrombocytopenia: Secondary | ICD-10-CM | POA: Diagnosis present

## 2020-04-22 DIAGNOSIS — E538 Deficiency of other specified B group vitamins: Secondary | ICD-10-CM | POA: Diagnosis present

## 2020-04-22 DIAGNOSIS — E11649 Type 2 diabetes mellitus with hypoglycemia without coma: Secondary | ICD-10-CM | POA: Diagnosis present

## 2020-04-22 DIAGNOSIS — IMO0002 Reserved for concepts with insufficient information to code with codable children: Secondary | ICD-10-CM

## 2020-04-22 DIAGNOSIS — Z794 Long term (current) use of insulin: Secondary | ICD-10-CM

## 2020-04-22 DIAGNOSIS — E1165 Type 2 diabetes mellitus with hyperglycemia: Secondary | ICD-10-CM

## 2020-04-22 DIAGNOSIS — D638 Anemia in other chronic diseases classified elsewhere: Secondary | ICD-10-CM | POA: Diagnosis present

## 2020-04-22 DIAGNOSIS — I69319 Unspecified symptoms and signs involving cognitive functions following cerebral infarction: Secondary | ICD-10-CM

## 2020-04-22 DIAGNOSIS — E1122 Type 2 diabetes mellitus with diabetic chronic kidney disease: Secondary | ICD-10-CM | POA: Diagnosis present

## 2020-04-22 DIAGNOSIS — Z96641 Presence of right artificial hip joint: Secondary | ICD-10-CM | POA: Diagnosis present

## 2020-04-22 DIAGNOSIS — N185 Chronic kidney disease, stage 5: Secondary | ICD-10-CM | POA: Diagnosis present

## 2020-04-22 LAB — I-STAT VENOUS BLOOD GAS, ED
Acid-base deficit: 19 mmol/L — ABNORMAL HIGH (ref 0.0–2.0)
Bicarbonate: 8.7 mmol/L — ABNORMAL LOW (ref 20.0–28.0)
Calcium, Ion: 1.17 mmol/L (ref 1.15–1.40)
HCT: 29 % — ABNORMAL LOW (ref 39.0–52.0)
Hemoglobin: 9.9 g/dL — ABNORMAL LOW (ref 13.0–17.0)
O2 Saturation: 86 %
Potassium: 5.3 mmol/L — ABNORMAL HIGH (ref 3.5–5.1)
Sodium: 126 mmol/L — ABNORMAL LOW (ref 135–145)
TCO2: 9 mmol/L — ABNORMAL LOW (ref 22–32)
pCO2, Ven: 26.7 mmHg — ABNORMAL LOW (ref 44.0–60.0)
pH, Ven: 7.12 — CL (ref 7.250–7.430)
pO2, Ven: 67 mmHg — ABNORMAL HIGH (ref 32.0–45.0)

## 2020-04-22 LAB — I-STAT CHEM 8, ED
BUN: 62 mg/dL — ABNORMAL HIGH (ref 8–23)
Calcium, Ion: 1.19 mmol/L (ref 1.15–1.40)
Chloride: 96 mmol/L — ABNORMAL LOW (ref 98–111)
Creatinine, Ser: 4.9 mg/dL — ABNORMAL HIGH (ref 0.61–1.24)
Glucose, Bld: >700 mg/dL (ref 70–99)
HCT: 27 % — ABNORMAL LOW (ref 39.0–52.0)
Hemoglobin: 9.2 g/dL — ABNORMAL LOW (ref 13.0–17.0)
Potassium: 5.2 mmol/L — ABNORMAL HIGH (ref 3.5–5.1)
Sodium: 126 mmol/L — ABNORMAL LOW (ref 135–145)
TCO2: 10 mmol/L — ABNORMAL LOW (ref 22–32)

## 2020-04-22 LAB — CBG MONITORING, ED
Glucose-Capillary: 556 mg/dL (ref 70–99)
Glucose-Capillary: >600 mg/dL (ref 70–99)
Glucose-Capillary: >600 mg/dL (ref 70–99)
Glucose-Capillary: >600 mg/dL (ref 70–99)
Glucose-Capillary: >600 mg/dL (ref 70–99)

## 2020-04-22 LAB — LACTIC ACID, PLASMA: Lactic Acid, Venous: 3.6 mmol/L (ref 0.5–1.9)

## 2020-04-22 MED ORDER — DEXTROSE 50 % IV SOLN
0.0000 mL | INTRAVENOUS | Status: DC | PRN
  Filled 2020-04-22 (×2): qty 50

## 2020-04-22 MED ORDER — SODIUM CHLORIDE 0.9 % IV SOLN: INTRAVENOUS | Status: DC

## 2020-04-22 MED ORDER — DEXTROSE 50 % IV SOLN: 0.0000 mL | INTRAVENOUS | Status: DC | PRN

## 2020-04-22 MED ORDER — DEXTROSE-NACL 5-0.45 % IV SOLN: INTRAVENOUS | Status: DC

## 2020-04-22 MED ORDER — ATORVASTATIN CALCIUM 40 MG PO TABS
40.0000 mg | ORAL_TABLET | Freq: Every day | ORAL | Status: DC
  Administered 2020-04-23 – 2020-04-28 (×6): 40 mg via ORAL
  Filled 2020-04-22 (×6): qty 1

## 2020-04-22 MED ORDER — SODIUM CHLORIDE 0.9 % IV BOLUS: 1000.0000 mL | Freq: Once | INTRAVENOUS | Status: DC

## 2020-04-22 MED ORDER — LEVOTHYROXINE SODIUM 25 MCG PO TABS
25.0000 ug | ORAL_TABLET | Freq: Every day | ORAL | Status: DC
  Administered 2020-04-24 – 2020-04-28 (×5): 25 ug via ORAL
  Filled 2020-04-22 (×5): qty 1

## 2020-04-22 MED ORDER — INSULIN REGULAR(HUMAN) IN NACL 100-0.9 UT/100ML-% IV SOLN
INTRAVENOUS | Status: DC
  Administered 2020-04-22: 9 [IU]/h via INTRAVENOUS
  Filled 2020-04-22: qty 100

## 2020-04-22 MED ORDER — SODIUM CHLORIDE 0.9 % IV BOLUS: 500.0000 mL | Freq: Once | INTRAVENOUS | Status: DC

## 2020-04-22 MED ORDER — SODIUM CHLORIDE 0.9 % IV BOLUS
1000.0000 mL | Freq: Once | INTRAVENOUS | Status: AC
  Administered 2020-04-22: 1000 mL via INTRAVENOUS

## 2020-04-22 MED ORDER — INSULIN REGULAR(HUMAN) IN NACL 100-0.9 UT/100ML-% IV SOLN
INTRAVENOUS | Status: DC
  Administered 2020-04-23: 13 [IU]/h via INTRAVENOUS
  Filled 2020-04-22: qty 100

## 2020-04-22 MED ORDER — ASPIRIN EC 81 MG PO TBEC
81.0000 mg | DELAYED_RELEASE_TABLET | Freq: Every day | ORAL | Status: DC
  Administered 2020-04-23 – 2020-04-28 (×6): 81 mg via ORAL
  Filled 2020-04-22 (×6): qty 1

## 2020-04-22 MED ORDER — HEPARIN SODIUM (PORCINE) 5000 UNIT/ML IJ SOLN: 5000.0000 [IU] | Freq: Three times a day (TID) | INTRAMUSCULAR | Status: DC

## 2020-04-22 NOTE — ED Notes (Signed)
Pt c/o being  cold

## 2020-04-22 NOTE — ED Provider Notes (Signed)
[facility] EMERGENCY DEPARTMENT Provider Note   CSN: [id number] Arrival date & time: 04/22/20  1927     History Chief Complaint  Patient presents with  . Hyperglycemia    [name] [name] is a 74 y.o. male.  HPI     74 yo male ho dka recently discharged (yesterday) represents with elevated blood sugar.  Patient states he did not receive his insulin as prescribed.  His bs is elevated at >600.  Patient without other complaints. Per EMS patient had cbg high after dinne and was given 9 units which is consistent with his d/c ss.  CBG continued to read high and patient sent to ED'  From d/c summary 7/9: [name] [name] a 74 y.o.malewith medical history significant oftype2diabetes (uncontrolled),history of CVA with mild cognitive impairment, hypertension, chronic kidney disease stage IV who was recently admitted from 6/12-6/17with acute encephalopathy related to hyperosmolar hyperglycemia.  At that time his Lantus was reduced to 6 units/day with sliding scale for meal coverage due to ongoing hypoglycemic events. At that admission he was discharged to a skilled nursing facility due to a complicated social situation.  He has ongoing cognitive impairment and is being cared for by 2 sisters who are also responsible for caring for his 67 year old mother and a brother with Down syndrome.   For this hospitalization, he was found to be in DKA and started on adequate protocol.  He responded well and his DKA resolved.  He was transitioned back to sliding scale insulin and Lantus. He also had acute on chronic renal failure which also responded to IV fluids.  His case was discussed with family especially his son [name] [name].   His blood pressure also remained above goal and his hydralazine was uptitrated as needed.  He may need further titration of other antihypertensives for adequate control.   Past Medical History:  Diagnosis Date  . 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 Closed comminuted intertrochanteric fracture of left femur ([place])   . Diabetes mellitus without complication ([place])   . Diabetic hyperosmolar non-ketotic state ([place]) 05/15/2016  . DKA (diabetic ketoacidoses) ([place]) 05/09/2016  . Hypertension   . Hypoglycemia 11/29/2019  . Postoperative anemia due to acute blood loss 06/18/2016  . Retroperitoneal hematoma 06/18/2016  . Stroke ([place])   . Vitamin B 12 deficiency 06/18/2016    Patient Active Problem List   Diagnosis Date Noted  . DKA (diabetic ketoacidoses) ([place]) 04/14/2020  . PVD (peripheral vascular disease) ([place]) 02/02/2020  . Poor social situation 12/08/2019  . CKD (chronic kidney disease) stage 4, GFR 15-29 ml/min ([name]) 12/08/2019  . Cognitive decline 11/22/2019  . ETOH abuse 05/17/2019  . Chronic diastolic CHF (congestive heart failure) ([place]) 05/17/2019  . Uncontrolled type 2 diabetes mellitus with hyperglycemia, with long-term current use of insulin ([place]) 10/03/2018  . Underweight 11/18/2017  . Anemia 11/18/2017  . History of cerebrovascular accident (CVA) in adulthood 07/25/2017  . Depression with anxiety 07/25/2017  . Left ventricular diastolic dysfunction 23/55/7322  . Dementia ([place]) 07/25/2017  . Brittle diabetes mellitus ([place]) 06/28/2017  . Type 2 diabetes mellitus with hyperlipidemia ([place]) 06/27/2017  . Hypertension 06/27/2017  . Weakness 11/16/2016  . Noncompliance with medication regimen 08/17/2016  . History of DVT (deep vein thrombosis) 07/06/2016  . Vitamin B 12 deficiency 06/18/2016  . Hyponatremia 05/15/2016  . Anxiety 05/15/2016  . Narcotic dependency, continuous ([place]) 11/02/2015  . Uncontrolled type 2 diabetes mellitus with diabetic nephropathy, with long-term  current use of insulin ([place]) 08/12/2015    Past Surgical History:  Procedure Laterality Date  . HIP ARTHROPLASTY Right 02/05/2013   Procedure: ARTHROPLASTY BIPOLAR HIP;  Surgeon: [name], MD;  Location:  [facility];  Service: Orthopedics;  Laterality: Right;  . INTRAMEDULLARY (IM) NAIL INTERTROCHANTERIC Left 06/16/2016   Procedure: INTRAMEDULLARY (IM) NAIL INTERTROCHANTRIC;  Surgeon: Rod Can, MD;  Location: [facility];  Service: Orthopedics;  Laterality: Left;       Family History  Problem Relation Age of Onset  . Alzheimer's disease Mother   . Hypertension Mother   . Hyperlipidemia Father   . Hypertension Father   . Healthy Maternal Grandmother   . Pneumonia Maternal Grandfather     Social History   Tobacco Use  . Smoking status: Former Smoker    Packs/day: 0.25    Years: 30.00    Pack years: 7.50    Types: Cigarettes    Quit date: 02/01/2016    Years since quitting: 4.2  . Smokeless tobacco: Never Used  Vaping Use  . Vaping Use: Never used  Substance Use Topics  . Alcohol use: No  . Drug use: No    Home Medications Prior to Admission medications   Medication Sig Start Date End Date Taking? Authorizing Provider  ACCU-CHEK GUIDE test strip USE AS DIRECTED 12/19/19   Azzie Glatter, [name]  amLODipine (NORVASC) 10 MG tablet TAKE 1 TABLET ONCE DAILY. Patient taking differently: Take 10 mg by mouth daily.  09/28/19   Azzie Glatter, [name]  atorvastatin (LIPITOR) 40 MG tablet Take 1 tablet (40 mg total) by mouth daily. 03/29/20   Lurline Del, DO  Blood Glucose Monitoring Suppl (ACCU-CHEK GUIDE) w/Device KIT 1 Device by Does not apply route QID. Patient not taking: Reported on 01/03/2020 11/02/19   Azzie Glatter, [name]  furosemide (LASIX) 20 MG tablet Take 1 tablet (20 mg total) by mouth daily. 12/05/19   [name], MD  glucose blood (ACCU-CHEK GUIDE) test strip Use as instructed 11/03/18   Azzie Glatter, [name]  glucose blood (TRUE METRIX BLOOD GLUCOSE TEST) test strip CHECK BLOOD SUGAR UP TO 4 TIMES A DAY. Patient taking differently: 1 each by Other route See admin instructions. CHECK BLOOD SUGAR UP TO 2 TIMES A DAY. 05/25/18   Azzie Glatter, [name]  hydrALAZINE  (APRESOLINE) 100 MG tablet Take 1 tablet (100 mg total) by mouth 3 (three) times daily. 04/19/20   [name], MD  insulin aspart (NOVOLOG) 100 UNIT/ML injection Inject 1-9 Units into the skin 4 (four) times daily -  before meals and at bedtime. CBG 121 - 150: 1 unit, CBG 151 - 200: 2 units, CBG 201 - 250: 3 units, CBG 251 - 300: 5 units, CBG 301 - 350: 7 units, CBG 351 - 400  9 units, CBG > 400 call MD 04/20/20   [name], MD  insulin glargine (LANTUS) 100 UNIT/ML injection Inject 0.04 mLs (4 Units total) into the skin at bedtime. 04/19/20   [name], MD  Insulin Pen Needle 32G X 4 MM MISC 1 Device by Does not apply route in the morning, at noon, in the evening, and at bedtime. 01/03/20   [name], [name], MD  Insulin Syringe-Needle U-100 (BD INSULIN SYRINGE U/F) 31G X 5/16" 0.3 ML MISC Inject 1 each as directed 2 (two) times daily. as directed 11/02/19   Azzie Glatter, [name]  levothyroxine (SYNTHROID) 25 MCG tablet Take 25 mcg by mouth daily before breakfast.  04/13/20   [provider]  sodium bicarbonate 650 MG tablet Take 1 tablet (650 mg total) by mouth 2 (two) times daily. 04/19/20   [name], MD  vitamin B-12 1000 MCG tablet Take 1 tablet (1,000 mcg total) by mouth daily. 12/05/19   [name], MD  Vitamin D, Ergocalciferol, (DRISDOL) 1.25 MG (50000 UNIT) CAPS capsule Take 50,000 Units by mouth every 7 (seven) days. Thursdays    [provider]    Allergies    Patient has no known allergies.  Review of Systems   Review of Systems  All other systems reviewed and are negative.   Physical Exam Updated Vital Signs BP 131/70 (BP Location: Right Arm)   Pulse 98   Temp (!) 97.5 F (36.4 C) (Oral)   Resp 20   Ht 1.702 m ('5\' 7"' )   Wt 72.6 kg   SpO2 100%   BMI 25.06 kg/m   Physical Exam Vitals and nursing note reviewed.  Constitutional:      Appearance: Normal appearance.  HENT:     Head: Normocephalic.     Right Ear: External ear  normal.     Nose: Nose normal.     Mouth/Throat:     Mouth: Mucous membranes are moist.  Cardiovascular:     Rate and Rhythm: Normal rate.  Pulmonary:     Effort: Pulmonary effort is normal.  Abdominal:     General: Abdomen is flat.  Musculoskeletal:        General: Normal range of motion.  Skin:    General: Skin is warm.     Capillary Refill: Capillary refill takes less than 2 seconds.  Neurological:     Mental Status: He is alert. Mental status is at baseline.  Psychiatric:        Mood and Affect: Mood normal.     ED Results / Procedures / Treatments   Labs (all labs ordered are listed, but only abnormal results are displayed) Labs Reviewed  CBG MONITORING, ED - Abnormal; Notable for the following components:      Result Value   Glucose-Capillary >600 (*)    All other components within normal limits  I-STAT CHEM 8, ED  I-STAT VENOUS BLOOD GAS, ED    EKG None  Radiology No results found.  Procedures .Critical Care Performed by: Pattricia Boss, MD Authorized by: Pattricia Boss, MD   Critical care provider statement:    Critical care time (minutes):  45   Critical care end time:  04/22/2020 10:36 PM   Critical care was necessary to treat or prevent imminent or life-threatening deterioration of the following conditions:  Endocrine crisis   Critical care was time spent personally by me on the following activities:  Discussions with consultants, evaluation of patient's response to treatment, examination of patient, ordering and performing treatments and interventions, ordering and review of laboratory studies, ordering and review of radiographic studies, pulse oximetry, re-evaluation of patient's condition, obtaining history from patient or surrogate and review of old charts   (including critical care time)  Medications Ordered in ED Medications  sodium chloride 0.9 % bolus 1,000 mL (has no administration in time range)    ED Course  I have reviewed the triage  vital signs and the nursing notes.  Pertinent labs & imaging results that were available during my care of the patient were reviewed by me and considered in my medical decision making (see chart for details).    MDM Rules/Calculators/A&P  Patient presents with hyperglycemia and dka after recent discharge for same.  Patient started on ns and insulin drip per insulin tool Discussed with Dr. Posey Pronto who will see for admission  Final Clinical Impression(s) / ED Diagnoses Final diagnoses:  DKA (diabetic ketoacidoses) ([place])  Diabetic ketoacidosis without coma associated with type 1 diabetes mellitus [facility])    Rx / DC Orders ED Discharge Orders    None       Pattricia Boss, MD 04/22/20 2236

## 2020-04-22 NOTE — ED Notes (Signed)
The endol tool remains at 9   Blood sugar has not dropped below 700 yet

## 2020-04-22 NOTE — ED Notes (Signed)
thr pts blood sugar is still high but has just now dropped below 600  Insulin  Insulin still at 9 units

## 2020-04-22 NOTE — H&P (Addendum)
History and Physical    [name] [name] KGU:[id number] DOB: May 16, 1946 DOA: 04/22/2020  PCP: Libby Maw, MD  Patient coming from: [facility]  I have personally briefly reviewed patient's old medical records in [place]  Chief Complaint: Hyperglycemia  HPI: [name] [name] is a 74 y.o. male with medical history significant for uncontrolled insulin-dependent diabetes, CKD stage IV, history of CVA with mild cognitive impairment, hypertension, hyperlipidemia, and hypothyroidism who presents to the ED for evaluation of hyperglycemia.  Patient has had 4 hospitalizations this calendar year due to complications from diabetes.  He was hospitalized twice in February 2021 due to hypoglycemic episodes related to insulin therapy.  He has now been hospitalized twice in the last month for DKA.  Last hospitalized from 04/14/2020-04/20/2020 DKA with associated AKI on CKD as well as metabolic encephalopathy.  He was treated with insulin infusion and IV fluids and ultimately discharged to SNF on Lantus 4 units qhs plus NovoLog sliding scale insulin before meals and at bedtime.  Patient states he was brought to the ED today from his nursing facility due to worsening blood sugar control from his diabetes.  His only complaint is that he feels cold.  He denies any nausea, vomiting, abdominal pain, dysuria, chest pain, or shortness of breath.  ED Course:  Initial vitals showed BP 131/70, pulse 98, RR 20, temp 97.5 Fahrenheit, SPO2 100% on room air.  Initial CBG >600.  I-STAT Chem-8 showed sodium 126 (at least 140 when corrected for hyperglycemia), potassium 5.2, chloride 96, BUN 62, creatinine 4.9, serum glucose >700, hemoglobin 9.2, hematocrit 27.0.  VBG showed pH 7.12, PCO2 26.7, PO2 67.  Portable chest x-ray is negative for focal consolidation, edema, or effusion.  Patient was given 1.5 L normal saline and started on insulin infusion.  The hospitalist service was consulted to me for further  evaluation and management.  Review of Systems: All systems reviewed and are negative except as documented in history of present illness above.   Past Medical History:  Diagnosis Date  . 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 Closed comminuted intertrochanteric fracture of left femur ([place])   . Diabetes mellitus without complication ([place])   . Diabetic hyperosmolar non-ketotic state ([place]) 05/15/2016  . DKA (diabetic ketoacidoses) ([place]) 05/09/2016  . Hypertension   . Hypoglycemia 11/29/2019  . Postoperative anemia due to acute blood loss 06/18/2016  . Retroperitoneal hematoma 06/18/2016  . Stroke ([place])   . Vitamin B 12 deficiency 06/18/2016    Past Surgical History:  Procedure Laterality Date  . HIP ARTHROPLASTY Right 02/05/2013   Procedure: ARTHROPLASTY BIPOLAR HIP;  Surgeon: [name], MD;  Location: [facility];  Service: Orthopedics;  Laterality: Right;  . INTRAMEDULLARY (IM) NAIL INTERTROCHANTERIC Left 06/16/2016   Procedure: INTRAMEDULLARY (IM) NAIL INTERTROCHANTRIC;  Surgeon: Rod Can, MD;  Location: [facility];  Service: Orthopedics;  Laterality: Left;    Social History:  reports that he quit smoking about 4 years ago. His smoking use included cigarettes. He has a 7.50 pack-year smoking history. He has never used smokeless tobacco. He reports that he does not drink alcohol and does not use drugs.  No Known Allergies  Family History  Problem Relation Age of Onset  . Alzheimer's disease Mother   . Hypertension Mother   . Hyperlipidemia Father   . Hypertension Father   . Healthy Maternal Grandmother   . Pneumonia Maternal Grandfather      Prior to Admission medications  Medication Sig Start Date End Date Taking? Authorizing Provider  ACCU-CHEK GUIDE test strip USE AS DIRECTED 12/19/19   Azzie Glatter, [name]  amLODipine (NORVASC) 10 MG tablet TAKE 1 TABLET ONCE DAILY. Patient taking differently: Take 10 mg by mouth daily.   09/28/19   Azzie Glatter, [name]  atorvastatin (LIPITOR) 40 MG tablet Take 1 tablet (40 mg total) by mouth daily. 03/29/20   Lurline Del, DO  Blood Glucose Monitoring Suppl (ACCU-CHEK GUIDE) w/Device KIT 1 Device by Does not apply route QID. Patient not taking: Reported on 01/03/2020 11/02/19   Azzie Glatter, [name]  furosemide (LASIX) 20 MG tablet Take 1 tablet (20 mg total) by mouth daily. 12/05/19   [name], MD  glucose blood (ACCU-CHEK GUIDE) test strip Use as instructed 11/03/18   Azzie Glatter, [name]  glucose blood (TRUE METRIX BLOOD GLUCOSE TEST) test strip CHECK BLOOD SUGAR UP TO 4 TIMES A DAY. Patient taking differently: 1 each by Other route See admin instructions. CHECK BLOOD SUGAR UP TO 2 TIMES A DAY. 05/25/18   Azzie Glatter, [name]  hydrALAZINE (APRESOLINE) 100 MG tablet Take 1 tablet (100 mg total) by mouth 3 (three) times daily. 04/19/20   [name], MD  insulin aspart (NOVOLOG) 100 UNIT/ML injection Inject 1-9 Units into the skin 4 (four) times daily -  before meals and at bedtime. CBG 121 - 150: 1 unit, CBG 151 - 200: 2 units, CBG 201 - 250: 3 units, CBG 251 - 300: 5 units, CBG 301 - 350: 7 units, CBG 351 - 400  9 units, CBG > 400 call MD 04/20/20   [name], MD  insulin glargine (LANTUS) 100 UNIT/ML injection Inject 0.04 mLs (4 Units total) into the skin at bedtime. 04/19/20   [name], MD  Insulin Pen Needle 32G X 4 MM MISC 1 Device by Does not apply route in the morning, at noon, in the evening, and at bedtime. 01/03/20   [name], [name], MD  Insulin Syringe-Needle U-100 (BD INSULIN SYRINGE U/F) 31G X 5/16" 0.3 ML MISC Inject 1 each as directed 2 (two) times daily. as directed 11/02/19   Azzie Glatter, [name]  levothyroxine (SYNTHROID) 25 MCG tablet Take 25 mcg by mouth daily before breakfast.  04/13/20   [provider]  sodium bicarbonate 650 MG tablet Take 1 tablet (650 mg total) by mouth 2 (two) times daily. 04/19/20   [name], MD   vitamin B-12 1000 MCG tablet Take 1 tablet (1,000 mcg total) by mouth daily. 12/05/19   [name], MD  Vitamin D, Ergocalciferol, (DRISDOL) 1.25 MG (50000 UNIT) CAPS capsule Take 50,000 Units by mouth every 7 (seven) days. Thursdays    [provider]    Physical Exam: Vitals:   04/22/20 1928 04/22/20 1931 04/22/20 1943  BP:  131/70   Pulse:  98   Resp:  20   Temp:  (!) 97.5 F (36.4 C)   TempSrc:  Oral   SpO2: 96% 100%   Weight:   72.6 kg  Height:   _0  (1.702 m)   Constitutional: Resting supine in bed, NAD, calm, comfortable Eyes: PERRL, lids and conjunctivae normal ENMT: Mucous membranes are dry. Posterior pharynx clear of any exudate or lesions.Normal dentition.  Neck: normal, supple, no masses. Respiratory: clear to auscultation bilaterally, no wheezing, no crackles. Normal respiratory effort. No accessory muscle use.  Cardiovascular: Regular rate and rhythm, no murmurs / rubs / gallops. No extremity edema. 2+  pedal pulses. Abdomen: no tenderness, no masses palpated. No hepatosplenomegaly. Bowel sounds positive.  Musculoskeletal: no clubbing / cyanosis. No joint deformity upper and lower extremities. Good ROM, no contractures. Normal muscle tone.  Skin: no rashes, lesions, ulcers. No induration Neurologic: Speech is slow otherwise CN 2-12 grossly intact. Sensation intact, Strength 5/5 in all 4.  Psychiatric: Awake, alert, and oriented to self, place, and situation.  States the year is 2022.  Normal mood.   Labs on Admission: I have personally reviewed following labs and imaging studies  CBC: Recent Labs  Lab 04/16/20 0327 04/16/20 0327 04/17/20 0306 04/17/20 0306 04/18/20 0518 04/18/20 0518 04/19/20 0541 04/20/20 0457 04/22/20 2051 04/22/20 2052 04/22/20 2335  WBC 9.1   < > 6.0  --  5.4  --  4.8 5.0  --   --  7.9  NEUTROABS 5.1  --  3.1  --  2.2  --  2.1 2.3  --   --   --   HGB 8.3*   < > 8.6*   < > 8.3*   < > 8.9* 8.1* 9.9* 9.2* 8.1*  HCT 26.8*    < > 27.4*   < > 26.9*   < > 28.4* 26.3* 29.0* 27.0* 27.4*  MCV 94.7   < > 92.6  --  93.7  --  92.8 92.6  --   --  98.2  PLT 205   < > 202  --  189  --  205 176  --   --  94*   < > = values in this interval not displayed.   Basic Metabolic Panel: Recent Labs  Lab 04/17/20 0306 04/17/20 0306 04/18/20 0518 04/18/20 0518 04/19/20 0541 04/20/20 0457 04/22/20 2051 04/22/20 2052 04/22/20 2335  NA 138   < > 138   < > 142 140 126* 126* 129*  K 4.6   < > 4.2   < > 4.0 4.5 5.3* 5.2* 4.9  CL 110   < > 114*  --  115* 113*  --  96* 99  CO2 17*  --  18*  --  20* 21*  --   --  12*  GLUCOSE 260*   < > 159*  --  60* 207*  --  >700* 900*  BUN 51*   < > 40*  --  32* 35*  --  62* 64*  CREATININE 4.28*   < > 3.35*  --  2.99* 3.08*  --  4.90* 4.75*  CALCIUM 7.8*  --  7.9*  --  8.2* 7.9*  --   --  8.1*  MG 2.2  --  1.9  --  1.8 1.9  --   --   --   PHOS 4.1  --  4.2  --   --   --   --   --   --    < > = values in this interval not displayed.   GFR: Estimated Creatinine Clearance: 12.6 mL/min (A) (by C-G formula based on SCr of 4.75 mg/dL (H)). Liver Function Tests: Recent Labs  Lab 04/17/20 0306 04/18/20 0518  AST 17 14*  ALT 16 14  ALKPHOS 74 65  BILITOT 0.5 0.5  PROT 5.5* 5.3*  ALBUMIN 2.4* 2.4*   No results for input(s): LIPASE, AMYLASE in the last 168 hours. No results for input(s): AMMONIA in the last 168 hours. Coagulation Profile: No results for input(s): INR, PROTIME in the last 168 hours. Cardiac Enzymes: No results for input(s): CKTOTAL, CKMB, CKMBINDEX, TROPONINI  in the last 168 hours. BNP (last 3 results) No results for input(s): PROBNP in the last 8760 hours. HbA1C: No results for input(s): HGBA1C in the last 72 hours. CBG: Recent Labs  Lab 04/22/20 2228 04/22/20 2301 04/22/20 2333 04/22/20 2336 04/23/20 0009  GLUCAP >600* >600* >600* 556* >600*   Lipid Profile: No results for input(s): CHOL, HDL, LDLCALC, TRIG, CHOLHDL, LDLDIRECT in the last 72 hours. Thyroid  Function Tests: No results for input(s): TSH, T4TOTAL, FREET4, T3FREE, THYROIDAB in the last 72 hours. Anemia Panel: No results for input(s): VITAMINB12, FOLATE, FERRITIN, TIBC, IRON, RETICCTPCT in the last 72 hours. Urine analysis:    Component Value Date/Time   COLORURINE YELLOW 04/22/2020 2335   APPEARANCEUR CLEAR 04/22/2020 2335   LABSPEC 1.015 04/22/2020 2335   PHURINE 5.0 04/22/2020 2335   GLUCOSEU >=500 (A) 04/22/2020 2335   GLUCOSEU >=1000 (A) 12/08/2019 1139   HGBUR SMALL (A) 04/22/2020 2335   BILIRUBINUR NEGATIVE 04/22/2020 2335   BILIRUBINUR Negative 11/09/2019 0910   KETONESUR 20 (A) 04/22/2020 2335   PROTEINUR >=300 (A) 04/22/2020 2335   UROBILINOGEN 0.2 12/08/2019 1139   NITRITE NEGATIVE 04/22/2020 2335   LEUKOCYTESUR NEGATIVE 04/22/2020 2335    Radiological Exams on Admission: DG Chest Port 1 View  Result Date: 04/22/2020 CLINICAL DATA:  Hyperglycemia, tachypnea EXAM: PORTABLE CHEST 1 VIEW COMPARISON:  04/14/2020 FINDINGS: The heart size and mediastinal contours are within normal limits. Both lungs are clear. The visualized skeletal structures are unremarkable. IMPRESSION: No active disease. Electronically Signed   By: Randa Ngo M.D.   On: 04/22/2020 21:16    EKG: Not performed.  Assessment/Plan Principal Problem:   DKA (diabetic ketoacidoses) ([name]) Active Problems:   Anemia of chronic disease   Uncontrolled type 2 diabetes mellitus with diabetic nephropathy, with long-term current use of insulin ([name])   Acute renal failure superimposed on stage 4 chronic kidney disease ([name])   Hyperlipidemia associated with type 2 diabetes mellitus ([place])   Hypertension associated with diabetes ([place])   History of cerebrovascular accident (CVA) in adulthood   Hypothyroidism   Thrombocytopenia ([place])  [name] [name] is a 74 y.o. male with medical history significant for uncontrolled insulin-dependent diabetes, CKD stage IV, history of CVA with mild cognitive impairment,  hypertension, hyperlipidemia, and hypothyroidism who is admitted with DKA.   DKA in uncontrolled insulin-dependent diabetes: Patient admitted with recurrent DKA in setting of uncontrolled insulin-dependent diabetes.  Had prior admissions remdesivir due to hypoglycemia but now admitted for the third time with DKA. -Continue insulin infusion per DKA protocol -Give additional 1 L normal saline now -Continue IV NS @ 125 mL/hr and transition to D5-1/2 NS when CBG <250 -Continue serial BMPs  AKI on CKD stage IV: Due to dehydration/DKA.  Continue IV fluids as above and monitor with serial BMPs.  Hyperkalemia: In setting of DKA/AKI, anticipate improvement with IV fluids and insulin infusion.  Continue to monitor.  Hypertension: Currently normotensive.  Holding home hydralazine and amlodipine for now.  History of CVA with mild cognitive impairment: Chronic and stable.  Speech is slow but he is awake, alert, and oriented to person, place, situation.  States the year is 2022. -Continue atorvastatin and aspirin  Thrombocytopenia: New thrombocytopenia with platelet count 94,000 compared to 176,003 days ago.  No obvious bleeding.  Check peripheral smear, repeat CBC in a.m., and hold pharmacologic VTE prophylaxis.  Anemia of chronic disease: Chronic and appears stable without obvious bleeding.  Continue to monitor.  Hypothyroidism: Continue Synthroid.  Hyperlipidemia: Continue atorvastatin.  DVT prophylaxis: SCDs given thrombocytopenia Code Status: DNR, confirmed with patient Family Communication: Discussed with patient, he has discussed with family Disposition Plan: From [facility] home, likely discharge to same facility Consults called: None Admission status:  Status is: Observation  The patient remains OBS appropriate and will d/c before 2 midnights.  Dispo: The patient is from: SNF              Anticipated d/c is to: SNF              Anticipated d/c date is: 1 day pending  resolution of DKA and transition to subcutaneous insulin.              Patient currently is not medically stable to d/c.  Zada Finders MD [facility]  If 7PM-7AM, please contact night-coverage [email or web address]  04/23/2020, 12:50 AM

## 2020-04-22 NOTE — ED Notes (Signed)
Dr. Jeanell Sparrow and RN Gerald Stabs made aware of critical results.

## 2020-04-22 NOTE — ED Notes (Signed)
The pts insulin is at 9 for the third time  Glucose is above  600 still

## 2020-04-22 NOTE — ED Triage Notes (Signed)
Pt arrives via [facility] from [facility] (recent placement at facility since Friday 7/9). Per facility, pt had CBG of HIGH after dinner. Was given 9 units insulin and rechecked as HIGH. Facility called EMS who confirmed. Pt reports recent change in insulin. Per EMS pt had periods of tachypnea. Arrives A+Ox3 (unsure of date, blames it on age). VSS.

## 2020-04-23 ENCOUNTER — Other Ambulatory Visit: Payer: Self-pay

## 2020-04-23 ENCOUNTER — Encounter (HOSPITAL_COMMUNITY): Payer: Self-pay | Admitting: Internal Medicine

## 2020-04-23 DIAGNOSIS — Z8279 Family history of other congenital malformations, deformations and chromosomal abnormalities: Secondary | ICD-10-CM | POA: Diagnosis not present

## 2020-04-23 DIAGNOSIS — Z794 Long term (current) use of insulin: Secondary | ICD-10-CM | POA: Diagnosis not present

## 2020-04-23 DIAGNOSIS — E11649 Type 2 diabetes mellitus with hypoglycemia without coma: Secondary | ICD-10-CM | POA: Diagnosis present

## 2020-04-23 DIAGNOSIS — R739 Hyperglycemia, unspecified: Secondary | ICD-10-CM | POA: Diagnosis present

## 2020-04-23 DIAGNOSIS — D6959 Other secondary thrombocytopenia: Secondary | ICD-10-CM | POA: Diagnosis present

## 2020-04-23 DIAGNOSIS — D696 Thrombocytopenia, unspecified: Secondary | ICD-10-CM | POA: Diagnosis present

## 2020-04-23 DIAGNOSIS — D638 Anemia in other chronic diseases classified elsewhere: Secondary | ICD-10-CM | POA: Diagnosis not present

## 2020-04-23 DIAGNOSIS — E860 Dehydration: Secondary | ICD-10-CM | POA: Diagnosis present

## 2020-04-23 DIAGNOSIS — E039 Hypothyroidism, unspecified: Secondary | ICD-10-CM | POA: Diagnosis present

## 2020-04-23 DIAGNOSIS — E1122 Type 2 diabetes mellitus with diabetic chronic kidney disease: Secondary | ICD-10-CM | POA: Diagnosis present

## 2020-04-23 DIAGNOSIS — Z79890 Hormone replacement therapy (postmenopausal): Secondary | ICD-10-CM | POA: Diagnosis not present

## 2020-04-23 DIAGNOSIS — Z8249 Family history of ischemic heart disease and other diseases of the circulatory system: Secondary | ICD-10-CM | POA: Diagnosis not present

## 2020-04-23 DIAGNOSIS — E875 Hyperkalemia: Secondary | ICD-10-CM | POA: Diagnosis present

## 2020-04-23 DIAGNOSIS — E538 Deficiency of other specified B group vitamins: Secondary | ICD-10-CM | POA: Diagnosis present

## 2020-04-23 DIAGNOSIS — Z87891 Personal history of nicotine dependence: Secondary | ICD-10-CM | POA: Diagnosis not present

## 2020-04-23 DIAGNOSIS — D631 Anemia in chronic kidney disease: Secondary | ICD-10-CM | POA: Diagnosis present

## 2020-04-23 DIAGNOSIS — I129 Hypertensive chronic kidney disease with stage 1 through stage 4 chronic kidney disease, or unspecified chronic kidney disease: Secondary | ICD-10-CM | POA: Diagnosis present

## 2020-04-23 DIAGNOSIS — Z83438 Family history of other disorder of lipoprotein metabolism and other lipidemia: Secondary | ICD-10-CM | POA: Diagnosis not present

## 2020-04-23 DIAGNOSIS — E1169 Type 2 diabetes mellitus with other specified complication: Secondary | ICD-10-CM | POA: Diagnosis present

## 2020-04-23 DIAGNOSIS — E785 Hyperlipidemia, unspecified: Secondary | ICD-10-CM | POA: Diagnosis present

## 2020-04-23 DIAGNOSIS — Z66 Do not resuscitate: Secondary | ICD-10-CM | POA: Diagnosis present

## 2020-04-23 DIAGNOSIS — N179 Acute kidney failure, unspecified: Secondary | ICD-10-CM | POA: Diagnosis present

## 2020-04-23 DIAGNOSIS — I69319 Unspecified symptoms and signs involving cognitive functions following cerebral infarction: Secondary | ICD-10-CM | POA: Diagnosis not present

## 2020-04-23 DIAGNOSIS — N184 Chronic kidney disease, stage 4 (severe): Secondary | ICD-10-CM | POA: Diagnosis present

## 2020-04-23 DIAGNOSIS — Z20822 Contact with and (suspected) exposure to covid-19: Secondary | ICD-10-CM | POA: Diagnosis present

## 2020-04-23 DIAGNOSIS — E1110 Type 2 diabetes mellitus with ketoacidosis without coma: Secondary | ICD-10-CM | POA: Diagnosis present

## 2020-04-23 DIAGNOSIS — Z96641 Presence of right artificial hip joint: Secondary | ICD-10-CM | POA: Diagnosis present

## 2020-04-23 LAB — URINALYSIS, ROUTINE W REFLEX MICROSCOPIC
Bacteria, UA: NONE SEEN
Bilirubin Urine: NEGATIVE
Glucose, UA: >=500 mg/dL — AB
Ketones, ur: 20 mg/dL — AB
Leukocytes,Ua: NEGATIVE
Nitrite: NEGATIVE
Protein, ur: >=300 mg/dL — AB
Specific Gravity, Urine: 1.015 (ref 1.005–1.030)
pH: 5 (ref 5.0–8.0)

## 2020-04-23 LAB — BASIC METABOLIC PANEL
Anion gap: 11 (ref 5–15)
Anion gap: 11 (ref 5–15)
Anion gap: 18 — ABNORMAL HIGH (ref 5–15)
BUN: 61 mg/dL — ABNORMAL HIGH (ref 8–23)
BUN: 63 mg/dL — ABNORMAL HIGH (ref 8–23)
BUN: 64 mg/dL — ABNORMAL HIGH (ref 8–23)
CO2: 12 mmol/L — ABNORMAL LOW (ref 22–32)
CO2: 17 mmol/L — ABNORMAL LOW (ref 22–32)
CO2: 17 mmol/L — ABNORMAL LOW (ref 22–32)
Calcium: 7.9 mg/dL — ABNORMAL LOW (ref 8.9–10.3)
Calcium: 8 mg/dL — ABNORMAL LOW (ref 8.9–10.3)
Calcium: 8.1 mg/dL — ABNORMAL LOW (ref 8.9–10.3)
Chloride: 104 mmol/L (ref 98–111)
Chloride: 108 mmol/L (ref 98–111)
Chloride: 99 mmol/L (ref 98–111)
Creatinine, Ser: 4.6 mg/dL — ABNORMAL HIGH (ref 0.61–1.24)
Creatinine, Ser: 4.75 mg/dL — ABNORMAL HIGH (ref 0.61–1.24)
Creatinine, Ser: 4.75 mg/dL — ABNORMAL HIGH (ref 0.61–1.24)
GFR calc Af Amer: 13 mL/min — ABNORMAL LOW (ref >60)
GFR calc Af Amer: 13 mL/min — ABNORMAL LOW (ref >60)
GFR calc Af Amer: 13 mL/min — ABNORMAL LOW (ref >60)
GFR calc non Af Amer: 11 mL/min — ABNORMAL LOW (ref >60)
GFR calc non Af Amer: 11 mL/min — ABNORMAL LOW (ref >60)
GFR calc non Af Amer: 12 mL/min — ABNORMAL LOW (ref >60)
Glucose, Bld: 100 mg/dL — ABNORMAL HIGH (ref 70–99)
Glucose, Bld: 473 mg/dL — ABNORMAL HIGH (ref 70–99)
Glucose, Bld: 900 mg/dL (ref 70–99)
Potassium: 3.3 mmol/L — ABNORMAL LOW (ref 3.5–5.1)
Potassium: 3.5 mmol/L (ref 3.5–5.1)
Potassium: 4.9 mmol/L (ref 3.5–5.1)
Sodium: 129 mmol/L — ABNORMAL LOW (ref 135–145)
Sodium: 132 mmol/L — ABNORMAL LOW (ref 135–145)
Sodium: 136 mmol/L (ref 135–145)

## 2020-04-23 LAB — CBC
HCT: 27.4 % — ABNORMAL LOW (ref 39.0–52.0)
Hemoglobin: 8.1 g/dL — ABNORMAL LOW (ref 13.0–17.0)
MCH: 29 pg (ref 26.0–34.0)
MCHC: 29.6 g/dL — ABNORMAL LOW (ref 30.0–36.0)
MCV: 98.2 fL (ref 80.0–100.0)
Platelets: 94 10*3/uL — ABNORMAL LOW (ref 150–400)
RBC: 2.79 MIL/uL — ABNORMAL LOW (ref 4.22–5.81)
RDW: 13.8 % (ref 11.5–15.5)
WBC: 7.9 10*3/uL (ref 4.0–10.5)
nRBC: 0 % (ref 0.0–0.2)

## 2020-04-23 LAB — GLUCOSE, CAPILLARY
Glucose-Capillary: 137 mg/dL — ABNORMAL HIGH (ref 70–99)
Glucose-Capillary: 147 mg/dL — ABNORMAL HIGH (ref 70–99)
Glucose-Capillary: 211 mg/dL — ABNORMAL HIGH (ref 70–99)
Glucose-Capillary: 55 mg/dL — ABNORMAL LOW (ref 70–99)
Glucose-Capillary: 62 mg/dL — ABNORMAL LOW (ref 70–99)

## 2020-04-23 LAB — CBG MONITORING, ED
Glucose-Capillary: 220 mg/dL — ABNORMAL HIGH (ref 70–99)
Glucose-Capillary: 374 mg/dL — ABNORMAL HIGH (ref 70–99)
Glucose-Capillary: 41 mg/dL — CL (ref 70–99)
Glucose-Capillary: 43 mg/dL — CL (ref 70–99)
Glucose-Capillary: 433 mg/dL — ABNORMAL HIGH (ref 70–99)
Glucose-Capillary: 503 mg/dL (ref 70–99)
Glucose-Capillary: 508 mg/dL (ref 70–99)
Glucose-Capillary: 568 mg/dL (ref 70–99)
Glucose-Capillary: 597 mg/dL (ref 70–99)
Glucose-Capillary: 71 mg/dL (ref 70–99)
Glucose-Capillary: 74 mg/dL (ref 70–99)
Glucose-Capillary: 88 mg/dL (ref 70–99)
Glucose-Capillary: >600 mg/dL (ref 70–99)
Glucose-Capillary: >600 mg/dL (ref 70–99)
Glucose-Capillary: >600 mg/dL (ref 70–99)
Glucose-Capillary: >600 mg/dL (ref 70–99)
Glucose-Capillary: >600 mg/dL (ref 70–99)

## 2020-04-23 LAB — SARS CORONAVIRUS 2 BY RT PCR (HOSPITAL ORDER, PERFORMED IN ~~LOC~~ HOSPITAL LAB): SARS Coronavirus 2: NEGATIVE

## 2020-04-23 LAB — BETA-HYDROXYBUTYRIC ACID: Beta-Hydroxybutyric Acid: 4.47 mmol/L — ABNORMAL HIGH (ref 0.05–0.27)

## 2020-04-23 LAB — LACTIC ACID, PLASMA: Lactic Acid, Venous: 2.7 mmol/L (ref 0.5–1.9)

## 2020-04-23 MED ORDER — INSULIN GLARGINE 100 UNIT/ML ~~LOC~~ SOLN
10.0000 [IU] | Freq: Every day | SUBCUTANEOUS | Status: DC
  Filled 2020-04-23: qty 0.1

## 2020-04-23 MED ORDER — DEXTROSE-NACL 5-0.45 % IV SOLN: INTRAVENOUS | Status: DC

## 2020-04-23 MED ORDER — INSULIN ASPART 100 UNIT/ML ~~LOC~~ SOLN: 0.0000 [IU] | Freq: Every day | SUBCUTANEOUS | Status: DC

## 2020-04-23 MED ORDER — INSULIN GLARGINE 100 UNIT/ML ~~LOC~~ SOLN
4.0000 [IU] | Freq: Every day | SUBCUTANEOUS | Status: DC
  Administered 2020-04-23: 4 [IU] via SUBCUTANEOUS
  Filled 2020-04-23 (×2): qty 0.04

## 2020-04-23 MED ORDER — SODIUM CHLORIDE 0.9 % IV SOLN: INTRAVENOUS | Status: DC

## 2020-04-23 MED ORDER — SODIUM BICARBONATE 650 MG PO TABS
650.0000 mg | ORAL_TABLET | Freq: Two times a day (BID) | ORAL | Status: DC
  Administered 2020-04-23 – 2020-04-28 (×11): 650 mg via ORAL
  Filled 2020-04-23 (×13): qty 1

## 2020-04-23 MED ORDER — INSULIN ASPART 100 UNIT/ML ~~LOC~~ SOLN
1.0000 [IU] | SUBCUTANEOUS | Status: DC
  Administered 2020-04-24: 1 [IU] via SUBCUTANEOUS
  Administered 2020-04-24: 2 [IU] via SUBCUTANEOUS
  Administered 2020-04-24: 3 [IU] via SUBCUTANEOUS
  Administered 2020-04-24: 6 [IU] via SUBCUTANEOUS
  Administered 2020-04-24: 2 [IU] via SUBCUTANEOUS
  Administered 2020-04-25: 3 [IU] via SUBCUTANEOUS
  Administered 2020-04-25: 6 [IU] via SUBCUTANEOUS
  Administered 2020-04-25: 4 [IU] via SUBCUTANEOUS
  Administered 2020-04-26: 1 [IU] via SUBCUTANEOUS
  Administered 2020-04-26: 2 [IU] via SUBCUTANEOUS
  Administered 2020-04-26: 3 [IU] via SUBCUTANEOUS
  Administered 2020-04-26: 1 [IU] via SUBCUTANEOUS
  Administered 2020-04-27: 2 [IU] via SUBCUTANEOUS
  Administered 2020-04-27: 6 [IU] via SUBCUTANEOUS
  Administered 2020-04-27 (×3): 2 [IU] via SUBCUTANEOUS
  Administered 2020-04-28: 6 [IU] via SUBCUTANEOUS

## 2020-04-23 MED ORDER — INSULIN ASPART 100 UNIT/ML ~~LOC~~ SOLN: 0.0000 [IU] | Freq: Three times a day (TID) | SUBCUTANEOUS | Status: DC

## 2020-04-23 NOTE — ED Notes (Signed)
Lunch tray ordered 

## 2020-04-23 NOTE — ED Notes (Signed)
PAGED ADMITTING PER RN  

## 2020-04-23 NOTE — ED Notes (Signed)
Current IV not flushing Attending notified Verbal order for Midline ordered from IV team

## 2020-04-23 NOTE — Progress Notes (Signed)
PROGRESS NOTE    [name] [name]  XKG:[id number] DOB: 06-Apr-1946 DOA: 04/22/2020 PCP: Libby Maw, MD    Brief Narrative:  74 year old gentleman with extensive medical issues currently in nursing home, uncontrolled insulin-dependent diabetes, stage IV chronic kidney disease, history of CVA with cognitive dysfunction, hypertension, hyperlipidemia and hypothyroidism admitted from nursing home with hyperglycemia.  In February 2021, he had 2 admissions for hypoglycemic episodes 7/3-7/9, admitted with DKA and metabolic encephalopathy and discharged to nursing home.  Brittle diabetes.  He was discharged on very low doses of insulin. Brought back to the ER with worsening blood sugars.  In the emergency room hemodynamically stable.  On room air.  Blood glucose more than 700 with pH 7.12.  Fluid resuscitation and started on insulin drip. Remained in the emergency room, anion gap is closed.  Blood sugar 71.   Assessment & Plan:   Principal Problem:   DKA (diabetic ketoacidoses) ([place]) Active Problems:   Anemia of chronic disease   Uncontrolled type 2 diabetes mellitus with diabetic nephropathy, with long-term current use of insulin ([name])   Acute renal failure superimposed on stage 4 chronic kidney disease ([name])   Hyperlipidemia associated with type 2 diabetes mellitus ([place])   Hypertension associated with diabetes ([place])   History of cerebrovascular accident (CVA) in adulthood   Hypothyroidism   Thrombocytopenia ([place])  DKA, uncontrolled insulin-dependent diabetes, brittle diabetes: Previously patient had hypoglycemic episodes with small doses of insulin and he was discharged on only 4 units of Lantus at night.  Comes back with blood sugars more than 700. Anion gap is closed with clinical improvement.  Electrolytes normalizing.  Patient is able to eat his breakfast. Will convert him to subcu insulin.  Will start patient on very low-dose of long-acting insulin, frequent blood sugar check and  every 4 hours customized sliding scale. His blood sugar control is going to be challenging because of unreliable oral intake and advanced kidney disease.  AKI on CKD stage IV: Due to dehydration.  Treated with IV fluids.  Now at about baseline.  Hypertension: Blood pressures normotensive.  Holding antihypertensives.  History of CVA with mild cognitive dysfunction: Chronic and stable.  Continue aspirin and Lipitor.  Thrombocytopenia: Acute.  Obvious cause unknown.  We will recheck tomorrow morning.  Probably due to acute illnesses.  Hypothyroidism: On Synthroid that he will continue.  Chronic debility: Needing additional support.  Currently at nursing home.  Will go back to nursing home.   DVT prophylaxis: SCDs Start: 04/23/20 0049   Code Status: DNR Family Communication: None today.  Will call family. Disposition Plan: Status is: Inpatient  Remains inpatient appropriate because:Persistent severe electrolyte disturbances and IV treatments appropriate due to intensity of illness or inability to take PO   Dispo: The patient is from: SNF              Anticipated d/c is to: SNF              Anticipated d/c date is: 2 days              Patient currently is not medically stable to d/c.         Consultants:   None  Procedures:   None  Antimicrobials:   None   Subjective: Patient seen and examined.  Remains in ER.  Poor historian.  Denies any complaints.  Blood sugars improved.  And gap closed.  He is hungry.  Objective: Vitals:   04/23/20 0515 04/23/20 0700 04/23/20 0900 04/23/20 1000  BP: (!) 90/52 (!) 94/51 (!) 91/50 (!) 92/49  Pulse: 73 72 70 72  Resp: (!) 21 (!) 22 (!) 23 17  Temp:      TempSrc:      SpO2: 98% 99% 100% 100%  Weight:      Height:        Intake/Output Summary (Last 24 hours) at 04/23/2020 1150 Last data filed at 04/22/2020 2242 Gross per 24 hour  Intake 500 ml  Output -  Net 500 ml   Filed Weights   04/22/20 1943  Weight: 72.6 kg     Examination:  General exam: Appears calm and comfortable , chronically sick looking.  On room air.  Not in any distress. Respiratory system: Clear to auscultation. Respiratory effort normal. Cardiovascular system: S1 & S2 heard, RRR. No JVD, murmurs, rubs, gallops or clicks. No pedal edema. Gastrointestinal system: Abdomen is nondistended, soft and nontender. No organomegaly or masses felt. Normal bowel sounds heard.     Data Reviewed: I have personally reviewed following labs and imaging studies  CBC: Recent Labs  Lab 04/17/20 0306 04/17/20 0306 04/18/20 0518 04/18/20 0518 04/19/20 0541 04/20/20 0457 04/22/20 2051 04/22/20 2052 04/22/20 2335  WBC 6.0  --  5.4  --  4.8 5.0  --   --  7.9  NEUTROABS 3.1  --  2.2  --  2.1 2.3  --   --   --   HGB 8.6*   < > 8.3*   < > 8.9* 8.1* 9.9* 9.2* 8.1*  HCT 27.4*   < > 26.9*   < > 28.4* 26.3* 29.0* 27.0* 27.4*  MCV 92.6  --  93.7  --  92.8 92.6  --   --  98.2  PLT 202  --  189  --  205 176  --   --  94*   < > = values in this interval not displayed.   Basic Metabolic Panel: Recent Labs  Lab 04/17/20 0306 04/17/20 0306 04/18/20 0518 04/18/20 0518 04/19/20 0541 04/19/20 0541 04/20/20 0457 04/22/20 2051 04/22/20 2052 04/22/20 2335 04/23/20 0604  NA 138   < > 138   < > 142   < > 140 126* 126* 129* 132*  K 4.6   < > 4.2   < > 4.0   < > 4.5 5.3* 5.2* 4.9 3.5  CL 110   < > 114*   < > 115*  --  113*  --  96* 99 104  CO2 17*   < > 18*  --  20*  --  21*  --   --  12* 17*  GLUCOSE 260*   < > 159*   < > 60*  --  207*  --  >700* 900* 473*  BUN 51*   < > 40*   < > 32*  --  35*  --  62* 64* 63*  CREATININE 4.28*   < > 3.35*   < > 2.99*  --  3.08*  --  4.90* 4.75* 4.75*  CALCIUM 7.8*   < > 7.9*  --  8.2*  --  7.9*  --   --  8.1* 7.9*  MG 2.2  --  1.9  --  1.8  --  1.9  --   --   --   --   PHOS 4.1  --  4.2  --   --   --   --   --   --   --   --    < > =  values in this interval not displayed.   GFR: Estimated Creatinine Clearance: 12.6  mL/min (A) (by C-G formula based on SCr of 4.75 mg/dL (H)). Liver Function Tests: Recent Labs  Lab 04/17/20 0306 04/18/20 0518  AST 17 14*  ALT 16 14  ALKPHOS 74 65  BILITOT 0.5 0.5  PROT 5.5* 5.3*  ALBUMIN 2.4* 2.4*   No results for input(s): LIPASE, AMYLASE in the last 168 hours. No results for input(s): AMMONIA in the last 168 hours. Coagulation Profile: No results for input(s): INR, PROTIME in the last 168 hours. Cardiac Enzymes: No results for input(s): CKTOTAL, CKMB, CKMBINDEX, TROPONINI in the last 168 hours. BNP (last 3 results) No results for input(s): PROBNP in the last 8760 hours. HbA1C: No results for input(s): HGBA1C in the last 72 hours. CBG: Recent Labs  Lab 04/23/20 0440 04/23/20 0515 04/23/20 0609 04/23/20 0801 04/23/20 1010  GLUCAP 503* 433* 374* 220* 71   Lipid Profile: No results for input(s): CHOL, HDL, LDLCALC, TRIG, CHOLHDL, LDLDIRECT in the last 72 hours. Thyroid Function Tests: No results for input(s): TSH, T4TOTAL, FREET4, T3FREE, THYROIDAB in the last 72 hours. Anemia Panel: No results for input(s): VITAMINB12, FOLATE, FERRITIN, TIBC, IRON, RETICCTPCT in the last 72 hours. Sepsis Labs: Recent Labs  Lab 04/22/20 2040 04/22/20 2335  LATICACIDVEN 3.6* 2.7*    Recent Results (from the past 240 hour(s))  SARS Coronavirus 2 by RT PCR (hospital order, performed in [facility] hospital lab) Nasopharyngeal Nasopharyngeal Swab     Status: None   Collection Time: 04/14/20  1:44 PM   Specimen: Nasopharyngeal Swab  Result Value Ref Range Status   SARS Coronavirus 2 NEGATIVE NEGATIVE Final    Comment: (NOTE) SARS-CoV-2 target nucleic acids are NOT DETECTED.  The SARS-CoV-2 RNA is generally detectable in upper and lower respiratory specimens during the acute phase of infection. The lowest concentration of SARS-CoV-2 viral copies this assay can detect is 250 copies / mL. A negative result does not preclude SARS-CoV-2 infection and should not be  used as the sole basis for treatment or other patient management decisions.  A negative result may occur with improper specimen collection / handling, submission of specimen other than nasopharyngeal swab, presence of viral mutation(s) within the areas targeted by this assay, and inadequate number of viral copies (<250 copies / mL). A negative result must be combined with clinical observations, patient history, and epidemiological information.  Fact Sheet for Patients:   StrictlyIdeas.no  Fact Sheet for Healthcare Providers: BankingDealers.co.za  This test is not yet approved or  cleared by the [place] FDA and has been authorized for detection and/or diagnosis of SARS-CoV-2 by FDA under an Emergency Use Authorization (EUA).  This EUA will remain in effect (meaning this test can be used) for the duration of the COVID-19 declaration under Section 564(b)(1) of the Act, 21 U.S.C. section 360bbb-3(b)(1), unless the authorization is terminated or revoked sooner.  Performed at [facility], [place] [address] [place], [place] [address]   MRSA PCR Screening     Status: None   Collection Time: 04/14/20  9:57 PM   Specimen: Nasal Mucosa; Nasopharyngeal  Result Value Ref Range Status   MRSA by PCR NEGATIVE NEGATIVE Final    Comment:        The GeneXpert MRSA Assay (FDA approved for NASAL specimens only), is one component of a comprehensive MRSA colonization surveillance program. It is not intended to diagnose MRSA infection nor to guide or monitor treatment for MRSA infections.  Performed at [facility], [place] [address] [place], [place] [address]   SARS Coronavirus 2 by RT PCR (hospital order, performed in [facility] hospital lab) Nasopharyngeal Nasopharyngeal Swab     Status: None   Collection Time: 04/20/20 12:13 PM   Specimen: Nasopharyngeal Swab  Result Value Ref Range Status   SARS  Coronavirus 2 NEGATIVE NEGATIVE Final    Comment: (NOTE) SARS-CoV-2 target nucleic acids are NOT DETECTED.  The SARS-CoV-2 RNA is generally detectable in upper and lower respiratory specimens during the acute phase of infection. The lowest concentration of SARS-CoV-2 viral copies this assay can detect is 250 copies / mL. A negative result does not preclude SARS-CoV-2 infection and should not be used as the sole basis for treatment or other patient management decisions.  A negative result may occur with improper specimen collection / handling, submission of specimen other than nasopharyngeal swab, presence of viral mutation(s) within the areas targeted by this assay, and inadequate number of viral copies (<250 copies / mL). A negative result must be combined with clinical observations, patient history, and epidemiological information.  Fact Sheet for Patients:   StrictlyIdeas.no  Fact Sheet for Healthcare Providers: BankingDealers.co.za  This test is not yet approved or  cleared by the [place] FDA and has been authorized for detection and/or diagnosis of SARS-CoV-2 by FDA under an Emergency Use Authorization (EUA).  This EUA will remain in effect (meaning this test can be used) for the duration of the COVID-19 declaration under Section 564(b)(1) of the Act, 21 U.S.C. section 360bbb-3(b)(1), unless the authorization is terminated or revoked sooner.  Performed at [facility], [place] [address], [place], [place] [address]   SARS Coronavirus 2 by RT PCR (hospital order, performed in [facility] hospital lab) Nasopharyngeal Nasopharyngeal Swab     Status: None   Collection Time: 04/23/20 12:10 AM   Specimen: Nasopharyngeal Swab  Result Value Ref Range Status   SARS Coronavirus 2 NEGATIVE NEGATIVE Final    Comment: (NOTE) SARS-CoV-2 target nucleic acids are NOT DETECTED.  The SARS-CoV-2 RNA is generally detectable  in upper and lower respiratory specimens during the acute phase of infection. The lowest concentration of SARS-CoV-2 viral copies this assay can detect is 250 copies / mL. A negative result does not preclude SARS-CoV-2 infection and should not be used as the sole basis for treatment or other patient management decisions.  A negative result may occur with improper specimen collection / handling, submission of specimen other than nasopharyngeal swab, presence of viral mutation(s) within the areas targeted by this assay, and inadequate number of viral copies (<250 copies / mL). A negative result must be combined with clinical observations, patient history, and epidemiological information.  Fact Sheet for Patients:   StrictlyIdeas.no  Fact Sheet for Healthcare Providers: BankingDealers.co.za  This test is not yet approved or  cleared by the [place] FDA and has been authorized for detection and/or diagnosis of SARS-CoV-2 by FDA under an Emergency Use Authorization (EUA).  This EUA will remain in effect (meaning this test can be used) for the duration of the COVID-19 declaration under Section 564(b)(1) of the Act, 21 U.S.C. section 360bbb-3(b)(1), unless the authorization is terminated or revoked sooner.  Performed at [facility], [place] [address], [place], [place] [address]          Radiology Studies: DG Chest Port 1 View  Result Date: 04/22/2020 CLINICAL DATA:  Hyperglycemia, tachypnea EXAM: PORTABLE CHEST 1 VIEW COMPARISON:  04/14/2020  FINDINGS: The heart size and mediastinal contours are within normal limits. Both lungs are clear. The visualized skeletal structures are unremarkable. IMPRESSION: No active disease. Electronically Signed   By: Randa Ngo M.D.   On: 04/22/2020 21:16        Scheduled Meds: . aspirin EC  81 mg Oral Daily  . atorvastatin  40 mg Oral Daily  . insulin aspart  1-6 Units Subcutaneous Q4H   . insulin glargine  4 Units Subcutaneous Daily  . levothyroxine  25 mcg Oral Q0600  . sodium bicarbonate  650 mg Oral BID   Continuous Infusions: . sodium chloride 125 mL/hr at 04/23/20 0137  . dextrose 5 % and 0.45% NaCl 75 mL/hr at 04/23/20 0845  . sodium chloride       LOS: 0 days    Time spent: 30 minutes    Barb Merino, MD [facility] Pager [phone]

## 2020-04-23 NOTE — ED Notes (Signed)
Patient ate 25% of his meal

## 2020-04-23 NOTE — ED Notes (Addendum)
CBG at 41; 43 D50 administered per hypoglycemia protocol Pt is not eating his meals States he does not like the food   Will continue to monitor Attending paged

## 2020-04-23 NOTE — ED Notes (Signed)
Lunch tray at bedside Food cut-up and set up for patient to eat

## 2020-04-23 NOTE — ED Notes (Signed)
Pt appears not as talkative as he was prior and not as interested in his TV show He is slow to respond. CBG assessed, 41 on first check and 43 on re-check

## 2020-04-23 NOTE — ED Notes (Signed)
Diet tray ordered 

## 2020-04-23 NOTE — ED Notes (Signed)
The pts lab bld sugar is 900 the insulin drip is remaining at 11.5units

## 2020-04-23 NOTE — Progress Notes (Signed)
Inpatient Diabetes Program Recommendations  AACE/ADA: New Consensus Statement on Inpatient Glycemic Control (2015)  Target Ranges:  Prepandial:   less than 140 mg/dL      Peak postprandial:   less than 180 mg/dL (1-2 hours)      Critically ill patients:  140 - 180 mg/dL   Lab Results  Component Value Date   GLUCAP 220 (H) 04/23/2020   HGBA1C 10.3 (H) 03/24/2020    Review of Glycemic Control  Diabetes history: DM1 Outpatient Diabetes medications: Lantus 4 units  Current orders for Inpatient glycemic control: IV insulin  Inpatient Diabetes Program Recommendations:   Patient is type 1 and has received high doses of IV insulin to decrease blood sugars. When ready for transition to subcutaneous: Recommend no basal insulin and  -Custom Novolog correction scale 0-5 units q 4 hrs.      151-200  1 unit      201-250  2 units      251-300  3 units      301-350  4 units      351-400  5 units Until see what CBGs are a few hours afterwards discontinued due to elevated BUN and creatinine.  Will follow and review again this afternoon.  Thank you, Nani Gasser. Zonnique Norkus, RN, MSN, CDE  Diabetes Coordinator Inpatient Glycemic Control Team Team Pager [phone] (8am-5pm) 04/23/2020 8:59 AM

## 2020-04-24 LAB — GLUCOSE, CAPILLARY
Glucose-Capillary: 41 mg/dL — CL (ref 70–99)
Glucose-Capillary: 100 mg/dL — ABNORMAL HIGH (ref 70–99)
Glucose-Capillary: 167 mg/dL — ABNORMAL HIGH (ref 70–99)
Glucose-Capillary: 204 mg/dL — ABNORMAL HIGH (ref 70–99)
Glucose-Capillary: 255 mg/dL — ABNORMAL HIGH (ref 70–99)
Glucose-Capillary: 390 mg/dL — ABNORMAL HIGH (ref 70–99)
Glucose-Capillary: 310 mg/dL — ABNORMAL HIGH (ref 70–99)

## 2020-04-24 LAB — BASIC METABOLIC PANEL
Anion gap: 12 (ref 5–15)
BUN: 57 mg/dL — ABNORMAL HIGH (ref 8–23)
CO2: 18 mmol/L — ABNORMAL LOW (ref 22–32)
Calcium: 7.8 mg/dL — ABNORMAL LOW (ref 8.9–10.3)
Chloride: 106 mmol/L (ref 98–111)
Creatinine, Ser: 4.31 mg/dL — ABNORMAL HIGH (ref 0.61–1.24)
GFR calc Af Amer: 15 mL/min — ABNORMAL LOW (ref >60)
GFR calc non Af Amer: 13 mL/min — ABNORMAL LOW (ref >60)
Glucose, Bld: 110 mg/dL — ABNORMAL HIGH (ref 70–99)
Potassium: 4 mmol/L (ref 3.5–5.1)
Sodium: 136 mmol/L (ref 135–145)

## 2020-04-24 LAB — CBC WITH DIFFERENTIAL/PLATELET
Abs Immature Granulocytes: 0.04 10*3/uL (ref 0.00–0.07)
Basophils Absolute: 0 10*3/uL (ref 0.0–0.1)
Basophils Relative: 0 %
Eosinophils Absolute: 0.4 10*3/uL (ref 0.0–0.5)
Eosinophils Relative: 6 %
HCT: 23.7 % — ABNORMAL LOW (ref 39.0–52.0)
Hemoglobin: 7.9 g/dL — ABNORMAL LOW (ref 13.0–17.0)
Immature Granulocytes: 1 %
Lymphocytes Relative: 31 %
Lymphs Abs: 2.3 10*3/uL (ref 0.7–4.0)
MCH: 29.9 pg (ref 26.0–34.0)
MCHC: 33.3 g/dL (ref 30.0–36.0)
MCV: 89.8 fL (ref 80.0–100.0)
Monocytes Absolute: 0.8 10*3/uL (ref 0.1–1.0)
Monocytes Relative: 11 %
Neutro Abs: 3.8 10*3/uL (ref 1.7–7.7)
Neutrophils Relative %: 51 %
Platelets: 217 10*3/uL (ref 150–400)
RBC: 2.64 MIL/uL — ABNORMAL LOW (ref 4.22–5.81)
RDW: 13.8 % (ref 11.5–15.5)
WBC: 7.4 10*3/uL (ref 4.0–10.5)
nRBC: 0 % (ref 0.0–0.2)

## 2020-04-24 LAB — MAGNESIUM: Magnesium: 1.9 mg/dL (ref 1.7–2.4)

## 2020-04-24 LAB — PHOSPHORUS: Phosphorus: 4.4 mg/dL (ref 2.5–4.6)

## 2020-04-24 MED ORDER — INSULIN GLARGINE 100 UNIT/ML ~~LOC~~ SOLN
4.0000 [IU] | Freq: Every day | SUBCUTANEOUS | Status: DC
  Administered 2020-04-24 – 2020-04-25 (×2): 4 [IU] via SUBCUTANEOUS
  Filled 2020-04-24 (×3): qty 0.04

## 2020-04-24 NOTE — Progress Notes (Signed)
Inpatient Diabetes Program Recommendations  AACE/ADA: New Consensus Statement on Inpatient Glycemic Control (2015)  Target Ranges:  Prepandial:   less than 140 mg/dL      Peak postprandial:   less than 180 mg/dL (1-2 hours)      Critically ill patients:  140 - 180 mg/dL   Lab Results  Component Value Date   GLUCAP 167 (H) 04/24/2020   HGBA1C 10.3 (H) 03/24/2020    Review of Glycemic Control Results for [name] [name], [name] [name] (MRN [id number]) as of 04/24/2020 08:02  Ref. Range 04/23/2020 18:49 04/23/2020 20:13 04/23/2020 23:51 04/24/2020 04:32 04/24/2020 07:46  Glucose-Capillary Latest Ref Range: 70 - 99 mg/dL 137 (H) 147 (H) 211 (H) 100 (H) 167 (H)   Diabetes history: DM1 Outpatient Diabetes medications: Lantus 4 units  Current orders for Inpatient glycemic control: Novolog correction 0-6 units q 4 hrs.   Inpatient Diabetes Program Recommendations:   -Add Lantus 4 units daily Noted patient did not eat well yesterday, so may need meal coverage added  Secure chat sent to Dr. Sloan Leiter.  Thank you, Nani Gasser. Paitlyn Mcclatchey, RN, MSN, CDE  Diabetes Coordinator Inpatient Glycemic Control Team Team Pager [phone] (8am-5pm) 04/24/2020 8:02 AM

## 2020-04-24 NOTE — Progress Notes (Signed)
PROGRESS NOTE    [name] [name]  KWI:[id number] DOB: Aug 10, 1946 DOA: 04/22/2020 PCP: Libby Maw, MD    Brief Narrative:  74 year old gentleman with extensive medical issues currently in nursing home, uncontrolled insulin-dependent diabetes, stage IV chronic kidney disease, history of CVA with cognitive dysfunction, hypertension, hyperlipidemia and hypothyroidism admitted from nursing home with hyperglycemia.  In February 2021, he had 2 admissions for hypoglycemic episodes 7/3-7/9, admitted with DKA and metabolic encephalopathy and discharged to nursing home.  Brittle diabetes.  He was discharged on very low doses of insulin. Brought back to the ER with worsening blood sugars.  In the emergency room hemodynamically stable.  On room air.  Blood glucose more than 700 with pH 7.12.  Fluid resuscitation and started on insulin drip. Anion gap closed and transition to subcu insulin, patient developed hypoglycemic episodes. 7/13, last 24 hours remained on dextrose infusion with blood sugars hardly more than 100.  Does not like to eat much.   Assessment & Plan:   Principal Problem:   DKA (diabetic ketoacidoses) ([place]) Active Problems:   Anemia of chronic disease   Uncontrolled type 2 diabetes mellitus with diabetic nephropathy, with long-term current use of insulin ([name])   Acute renal failure superimposed on stage 4 chronic kidney disease ([name])   Hyperlipidemia associated with type 2 diabetes mellitus ([place])   Hypertension associated with diabetes ([place])   History of cerebrovascular accident (CVA) in adulthood   Hypothyroidism   Thrombocytopenia ([place])  DKA, uncontrolled insulin-dependent diabetes, brittle diabetes: Previously patient had hypoglycemic episodes with small doses of insulin and he was discharged on only 4 units of Lantus at night.  Comes back with blood sugars more than 700. DKA treated with insulin drip, subsequent hypoglycemia.  Normalizing now. Discontinue dextrose fluids.   Keep on customized sliding scale.  Start low-dose Lantus 4 units at night and monitor. His blood sugar control is going to be challenging because of unreliable oral intake and advanced kidney disease.  AKI on CKD stage IV: Due to dehydration.  Treated with IV fluids.  Now at about baseline.  Hypertension: Blood pressures normotensive.  Holding antihypertensives.  History of CVA with mild cognitive dysfunction: Chronic and stable.  Continue aspirin and Lipitor.  Thrombocytopenia: Acute.  Obvious cause unknown.  Normalized.  Hypothyroidism: On Synthroid that he will continue.  Chronic debility: Needing additional support.  Currently at nursing home.  Will go back to nursing home.   DVT prophylaxis: SCDs Start: 04/23/20 0049   Code Status: DNR Family Communication: Sister on the phone, 7/12 Disposition Plan: Status is: Inpatient  Remains inpatient appropriate because:Persistent severe electrolyte disturbances and IV treatments appropriate due to intensity of illness or inability to take PO   Dispo: The patient is from: SNF              Anticipated d/c is to: SNF              Anticipated d/c date is: 2 days              Patient currently is not medically stable to d/c.         Consultants:   None  Procedures:   None  Antimicrobials:   None   Subjective: Patient seen and examined.  No overnight events.  Still on 5% dextrose overnight with blood sugars relatively stable.  Mental status is stable.  "I do not like any cold food so I am not eating anything that is cold"  Objective: Vitals:   04/23/20  2353 04/24/20 0356 04/24/20 0527 04/24/20 0748  BP:   136/78   Pulse:   66   Resp:   18   Temp: 98.3 F (36.8 C) 98 F (36.7 C) 98.3 F (36.8 C) 98.4 F (36.9 C)  TempSrc: Oral Oral Oral Oral  SpO2:   95%   Weight:      Height:        Intake/Output Summary (Last 24 hours) at 04/24/2020 1143 Last data filed at 04/24/2020 0340 Gross per 24 hour  Intake 1453.3  ml  Output 500 ml  Net 953.3 ml   Filed Weights   04/22/20 1943  Weight: 72.6 kg    Examination:  General exam: Appears calm and comfortable , chronically sick looking.  On room air.  Not in any distress.  Patient is alert oriented x3.  Looks comfortable.  Hydrated.  Mucous membranes are moist. Respiratory system: Clear to auscultation. Respiratory effort normal. Cardiovascular system: S1 & S2 heard, RRR. No JVD, murmurs, rubs, gallops or clicks. No pedal edema. Gastrointestinal system: Abdomen is nondistended, soft and nontender. No organomegaly or masses felt. Normal bowel sounds heard.     Data Reviewed: I have personally reviewed following labs and imaging studies  CBC: Recent Labs  Lab 04/18/20 0518 04/18/20 0518 04/19/20 0541 04/19/20 0541 04/20/20 0457 04/22/20 2051 04/22/20 2052 04/22/20 2335 04/24/20 0509  WBC 5.4  --  4.8  --  5.0  --   --  7.9 7.4  NEUTROABS 2.2  --  2.1  --  2.3  --   --   --  3.8  HGB 8.3*   < > 8.9*   < > 8.1* 9.9* 9.2* 8.1* 7.9*  HCT 26.9*   < > 28.4*   < > 26.3* 29.0* 27.0* 27.4* 23.7*  MCV 93.7  --  92.8  --  92.6  --   --  98.2 89.8  PLT 189  --  205  --  176  --   --  94* 217   < > = values in this interval not displayed.   Basic Metabolic Panel: Recent Labs  Lab 04/18/20 0518 04/18/20 0518 04/19/20 0541 04/19/20 0541 04/20/20 0457 04/22/20 2051 04/22/20 2052 04/22/20 2335 04/23/20 0604 04/23/20 1133 04/24/20 0509  NA 138   < > 142   < > 140   < > 126* 129* 132* 136 136  K 4.2   < > 4.0   < > 4.5   < > 5.2* 4.9 3.5 3.3* 4.0  CL 114*   < > 115*   < > 113*  --  96* 99 104 108 106  CO2 18*   < > 20*   < > 21*  --   --  12* 17* 17* 18*  GLUCOSE 159*   < > 60*   < > 207*  --  >700* 900* 473* 100* 110*  BUN 40*   < > 32*   < > 35*  --  62* 64* 63* 61* 57*  CREATININE 3.35*   < > 2.99*   < > 3.08*  --  4.90* 4.75* 4.75* 4.60* 4.31*  CALCIUM 7.9*   < > 8.2*   < > 7.9*  --   --  8.1* 7.9* 8.0* 7.8*  MG 1.9  --  1.8  --  1.9  --    --   --   --   --  1.9  PHOS 4.2  --   --   --   --   --   --   --   --   --  4.4   < > = values in this interval not displayed.   GFR: Estimated Creatinine Clearance: 13.8 mL/min (A) (by C-G formula based on SCr of 4.31 mg/dL (H)). Liver Function Tests: Recent Labs  Lab 04/18/20 0518  AST 14*  ALT 14  ALKPHOS 65  BILITOT 0.5  PROT 5.3*  ALBUMIN 2.4*   No results for input(s): LIPASE, AMYLASE in the last 168 hours. No results for input(s): AMMONIA in the last 168 hours. Coagulation Profile: No results for input(s): INR, PROTIME in the last 168 hours. Cardiac Enzymes: No results for input(s): CKTOTAL, CKMB, CKMBINDEX, TROPONINI in the last 168 hours. BNP (last 3 results) No results for input(s): PROBNP in the last 8760 hours. HbA1C: No results for input(s): HGBA1C in the last 72 hours. CBG: Recent Labs  Lab 04/23/20 1849 04/23/20 2013 04/23/20 2351 04/24/20 0432 04/24/20 0746  GLUCAP 137* 147* 211* 100* 167*   Lipid Profile: No results for input(s): CHOL, HDL, LDLCALC, TRIG, CHOLHDL, LDLDIRECT in the last 72 hours. Thyroid Function Tests: No results for input(s): TSH, T4TOTAL, FREET4, T3FREE, THYROIDAB in the last 72 hours. Anemia Panel: No results for input(s): VITAMINB12, FOLATE, FERRITIN, TIBC, IRON, RETICCTPCT in the last 72 hours. Sepsis Labs: Recent Labs  Lab 04/22/20 2040 04/22/20 2335  LATICACIDVEN 3.6* 2.7*    Recent Results (from the past 240 hour(s))  SARS Coronavirus 2 by RT PCR (hospital order, performed in [facility] hospital lab) Nasopharyngeal Nasopharyngeal Swab     Status: None   Collection Time: 04/14/20  1:44 PM   Specimen: Nasopharyngeal Swab  Result Value Ref Range Status   SARS Coronavirus 2 NEGATIVE NEGATIVE Final    Comment: (NOTE) SARS-CoV-2 target nucleic acids are NOT DETECTED.  The SARS-CoV-2 RNA is generally detectable in upper and lower respiratory specimens during the acute phase of infection. The lowest concentration of  SARS-CoV-2 viral copies this assay can detect is 250 copies / mL. A negative result does not preclude SARS-CoV-2 infection and should not be used as the sole basis for treatment or other patient management decisions.  A negative result may occur with improper specimen collection / handling, submission of specimen other than nasopharyngeal swab, presence of viral mutation(s) within the areas targeted by this assay, and inadequate number of viral copies (<250 copies / mL). A negative result must be combined with clinical observations, patient history, and epidemiological information.  Fact Sheet for Patients:   StrictlyIdeas.no  Fact Sheet for Healthcare Providers: BankingDealers.co.za  This test is not yet approved or  cleared by the [place] FDA and has been authorized for detection and/or diagnosis of SARS-CoV-2 by FDA under an Emergency Use Authorization (EUA).  This EUA will remain in effect (meaning this test can be used) for the duration of the COVID-19 declaration under Section 564(b)(1) of the Act, 21 U.S.C. section 360bbb-3(b)(1), unless the authorization is terminated or revoked sooner.  Performed at [facility], [place] [address] [place], [place] [address]   MRSA PCR Screening     Status: None   Collection Time: 04/14/20  9:57 PM   Specimen: Nasal Mucosa; Nasopharyngeal  Result Value Ref Range Status   MRSA by PCR NEGATIVE NEGATIVE Final    Comment:        The GeneXpert MRSA Assay (FDA approved for NASAL specimens only), is one component of a comprehensive MRSA colonization surveillance program. It is not intended to diagnose MRSA infection nor to guide or monitor treatment for MRSA infections. Performed at [facility]  [place] [address] [place], [place] [address]   SARS Coronavirus 2 by RT PCR (hospital order, performed in [facility] hospital lab) Nasopharyngeal  Nasopharyngeal Swab     Status: None   Collection Time: 04/20/20 12:13 PM   Specimen: Nasopharyngeal Swab  Result Value Ref Range Status   SARS Coronavirus 2 NEGATIVE NEGATIVE Final    Comment: (NOTE) SARS-CoV-2 target nucleic acids are NOT DETECTED.  The SARS-CoV-2 RNA is generally detectable in upper and lower respiratory specimens during the acute phase of infection. The lowest concentration of SARS-CoV-2 viral copies this assay can detect is 250 copies / mL. A negative result does not preclude SARS-CoV-2 infection and should not be used as the sole basis for treatment or other patient management decisions.  A negative result may occur with improper specimen collection / handling, submission of specimen other than nasopharyngeal swab, presence of viral mutation(s) within the areas targeted by this assay, and inadequate number of viral copies (<250 copies / mL). A negative result must be combined with clinical observations, patient history, and epidemiological information.  Fact Sheet for Patients:   StrictlyIdeas.no  Fact Sheet for Healthcare Providers: BankingDealers.co.za  This test is not yet approved or  cleared by the [place] FDA and has been authorized for detection and/or diagnosis of SARS-CoV-2 by FDA under an Emergency Use Authorization (EUA).  This EUA will remain in effect (meaning this test can be used) for the duration of the COVID-19 declaration under Section 564(b)(1) of the Act, 21 U.S.C. section 360bbb-3(b)(1), unless the authorization is terminated or revoked sooner.  Performed at [facility], [place] [address] [place], [place] [address]   SARS Coronavirus 2 by RT PCR (hospital order, performed in [facility] hospital lab) Nasopharyngeal Nasopharyngeal Swab     Status: None   Collection Time: 04/23/20 12:10 AM   Specimen: Nasopharyngeal Swab  Result Value Ref Range Status   SARS  Coronavirus 2 NEGATIVE NEGATIVE Final    Comment: (NOTE) SARS-CoV-2 target nucleic acids are NOT DETECTED.  The SARS-CoV-2 RNA is generally detectable in upper and lower respiratory specimens during the acute phase of infection. The lowest concentration of SARS-CoV-2 viral copies this assay can detect is 250 copies / mL. A negative result does not preclude SARS-CoV-2 infection and should not be used as the sole basis for treatment or other patient management decisions.  A negative result may occur with improper specimen collection / handling, submission of specimen other than nasopharyngeal swab, presence of viral mutation(s) within the areas targeted by this assay, and inadequate number of viral copies (<250 copies / mL). A negative result must be combined with clinical observations, patient history, and epidemiological information.  Fact Sheet for Patients:   StrictlyIdeas.no  Fact Sheet for Healthcare Providers: BankingDealers.co.za  This test is not yet approved or  cleared by the [place] FDA and has been authorized for detection and/or diagnosis of SARS-CoV-2 by FDA under an Emergency Use Authorization (EUA).  This EUA will remain in effect (meaning this test can be used) for the duration of the COVID-19 declaration under Section 564(b)(1) of the Act, 21 U.S.C. section 360bbb-3(b)(1), unless the authorization is terminated or revoked sooner.  Performed at [facility], [place] [address] [place],  [address]          Radiology Studies: DG Chest Port 1 View  Result Date: 04/22/2020 CLINICAL DATA:  Hyperglycemia, tachypnea EXAM: PORTABLE CHEST 1 VIEW COMPARISON:  04/14/2020 FINDINGS: The heart  size and mediastinal contours are within normal limits. Both lungs are clear. The visualized skeletal structures are unremarkable. IMPRESSION: No active disease. Electronically Signed   By: Randa Ngo M.D.   On:  04/22/2020 21:16        Scheduled Meds: . aspirin EC  81 mg Oral Daily  . atorvastatin  40 mg Oral Daily  . insulin aspart  1-6 Units Subcutaneous Q4H  . insulin glargine  4 Units Subcutaneous QHS  . levothyroxine  25 mcg Oral Q0600  . sodium bicarbonate  650 mg Oral BID   Continuous Infusions: . sodium chloride Stopped (04/23/20 1754)     LOS: 1 day    Time spent: 30 minutes    Barb Merino, MD [facility] Pager [phone]

## 2020-04-25 LAB — CBC WITH DIFFERENTIAL/PLATELET
Abs Immature Granulocytes: 0.02 10*3/uL (ref 0.00–0.07)
Basophils Absolute: 0 10*3/uL (ref 0.0–0.1)
Basophils Relative: 1 %
Eosinophils Absolute: 0.3 10*3/uL (ref 0.0–0.5)
Eosinophils Relative: 6 %
HCT: 24.3 % — ABNORMAL LOW (ref 39.0–52.0)
Hemoglobin: 7.7 g/dL — ABNORMAL LOW (ref 13.0–17.0)
Immature Granulocytes: 0 %
Lymphocytes Relative: 34 %
Lymphs Abs: 1.9 10*3/uL (ref 0.7–4.0)
MCH: 28.9 pg (ref 26.0–34.0)
MCHC: 31.7 g/dL (ref 30.0–36.0)
MCV: 91.4 fL (ref 80.0–100.0)
Monocytes Absolute: 0.7 10*3/uL (ref 0.1–1.0)
Monocytes Relative: 13 %
Neutro Abs: 2.5 10*3/uL (ref 1.7–7.7)
Neutrophils Relative %: 46 %
Platelets: 216 10*3/uL (ref 150–400)
RBC: 2.66 MIL/uL — ABNORMAL LOW (ref 4.22–5.81)
RDW: 14.2 % (ref 11.5–15.5)
WBC: 5.4 10*3/uL (ref 4.0–10.5)
nRBC: 0 % (ref 0.0–0.2)

## 2020-04-25 LAB — BASIC METABOLIC PANEL
Anion gap: 10 (ref 5–15)
BUN: 59 mg/dL — ABNORMAL HIGH (ref 8–23)
CO2: 18 mmol/L — ABNORMAL LOW (ref 22–32)
Calcium: 7.7 mg/dL — ABNORMAL LOW (ref 8.9–10.3)
Chloride: 107 mmol/L (ref 98–111)
Creatinine, Ser: 3.82 mg/dL — ABNORMAL HIGH (ref 0.61–1.24)
GFR calc Af Amer: 17 mL/min — ABNORMAL LOW (ref >60)
GFR calc non Af Amer: 15 mL/min — ABNORMAL LOW (ref >60)
Glucose, Bld: 202 mg/dL — ABNORMAL HIGH (ref 70–99)
Potassium: 4.7 mmol/L (ref 3.5–5.1)
Sodium: 135 mmol/L (ref 135–145)

## 2020-04-25 LAB — GLUCOSE, CAPILLARY
Glucose-Capillary: 142 mg/dL — ABNORMAL HIGH (ref 70–99)
Glucose-Capillary: 74 mg/dL (ref 70–99)
Glucose-Capillary: 255 mg/dL — ABNORMAL HIGH (ref 70–99)
Glucose-Capillary: 147 mg/dL — ABNORMAL HIGH (ref 70–99)
Glucose-Capillary: 387 mg/dL — ABNORMAL HIGH (ref 70–99)

## 2020-04-25 NOTE — Progress Notes (Addendum)
Patient from [facility] for rehab. CSW faxed in clinicals to insurance to see if they will approve him to return to SNF.   Emryn Flanery LCSW

## 2020-04-25 NOTE — Progress Notes (Signed)
PROGRESS NOTE    [name] [name]  HWE:[id number] DOB: [birth year] DOA: 04/22/2020 PCP: Libby Maw, MD    Brief Narrative:  74 year old gentleman with extensive medical issues currently in nursing home, uncontrolled insulin-dependent diabetes, stage IV chronic kidney disease, history of CVA with cognitive dysfunction, hypertension, hyperlipidemia and hypothyroidism admitted from nursing home with hyperglycemia.  In February 2021, he had 2 admissions for hypoglycemic episodes 7/3-7/9, admitted with DKA and metabolic encephalopathy and discharged to nursing home.  Brittle diabetes.  He was discharged on very low doses of insulin. Brought back to the ER with worsening blood sugars.  In the emergency room hemodynamically stable.  On room air.  Blood glucose more than 700 with pH 7.12.  Fluid resuscitation and started on insulin drip. Anion gap closed and transition to subcu insulin, patient developed hypoglycemic episodes.    Assessment & Plan:   Principal Problem:   DKA (diabetic ketoacidoses) ([place]) Active Problems:   Anemia of chronic disease   Uncontrolled type 2 diabetes mellitus with diabetic nephropathy, with long-term current use of insulin ([name])   Acute renal failure superimposed on stage 4 chronic kidney disease ([name])   Hyperlipidemia associated with type 2 diabetes mellitus ([employer])   Hypertension associated with diabetes ([place])   History of cerebrovascular accident (CVA) in adulthood   Hypothyroidism   Thrombocytopenia ([place])  DKA, uncontrolled insulin-dependent diabetes, brittle diabetes: Previously patient had hypoglycemic episodes with small doses of insulin and he was discharged on only 4 units of Lantus at night.  Comes back with blood sugars more than 700. DKA treated with insulin drip, subsequent hypoglycemia.  Normalizing now. Diabetic diet, Lantus 4 units at night, customized sliding scale.  Monitor today to ensure stabilization before discharge to the skilled nursing  facility.  AKI on CKD stage IV: Due to dehydration.  Treated with IV fluids.  Now at about baseline.  Hypertension: Blood pressures picking up.  We will continue to hold antihypertensives today.  History of CVA with mild cognitive dysfunction: Chronic and stable.  Continue aspirin and Lipitor.  Thrombocytopenia: Acute.  Obvious cause unknown.  Normalized.  Hypothyroidism: On Synthroid that he will continue.  Chronic debility: Needing additional support.  Currently at nursing home.  Will go back to nursing home. Work with PT OT today.   DVT prophylaxis: SCDs Start: 04/23/20 0049   Code Status: DNR Family Communication: Sister on the phone, 7/12 Disposition Plan: Status is: Inpatient  Remains inpatient appropriate because:Persistent severe electrolyte disturbances and IV treatments appropriate due to intensity of illness or inability to take PO   Dispo: The patient is from: SNF              Anticipated d/c is to: SNF              Anticipated d/c date is: Architectural technologist.              Patient currently is not medically stable to d/c.  Anticipate tomorrow.         Consultants:   None  Procedures:   None  Antimicrobials:   None   Subjective: Seen and examined.  No overnight events.  Blood sugars stable.  He was eating regular diet and regular nutritional supplements of blood sugars were more than 200.  He himself denies any complaints.  Objective: Vitals:   04/24/20 1605 04/24/20 1943 04/25/20 0509 04/25/20 0732  BP: (!) 150/80 126/65 (!) 157/83   Pulse:  82 72   Resp: (!) 21 (!) 22 (!) 24  Temp: 97.6 F (36.4 C) 98.6 F (37 C) 98.5 F (36.9 C) 98.2 F (36.8 C)  TempSrc: Oral Oral Oral Oral  SpO2: 95% 97% 97%   Weight:      Height:        Intake/Output Summary (Last 24 hours) at 04/25/2020 1158 Last data filed at 04/25/2020 0320 Gross per 24 hour  Intake 480 ml  Output 1450 ml  Net -970 ml   Filed Weights   04/22/20 1943  Weight: 72.6 kg     Examination:  General exam: Appears calm and comfortable , chronically sick looking.  On room air.  Not in any distress.  Patient is alert oriented x3.  Looks comfortable.  Hydrated.  Mucous membranes are moist. Respiratory system: Clear to auscultation. Respiratory effort normal. Cardiovascular system: S1 & S2 heard, RRR. No JVD, murmurs, rubs, gallops or clicks. No pedal edema. Gastrointestinal system: Abdomen is nondistended, soft and nontender. No organomegaly or masses felt. Normal bowel sounds heard.     Data Reviewed: I have personally reviewed following labs and imaging studies  CBC: Recent Labs  Lab 04/19/20 0541 04/19/20 0541 04/20/20 0457 04/20/20 0457 04/22/20 2051 04/22/20 2052 04/22/20 2335 04/24/20 0509 04/25/20 0819  WBC 4.8  --  5.0  --   --   --  7.9 7.4 5.4  NEUTROABS 2.1  --  2.3  --   --   --   --  3.8 2.5  HGB 8.9*   < > 8.1*   < > 9.9* 9.2* 8.1* 7.9* 7.7*  HCT 28.4*   < > 26.3*   < > 29.0* 27.0* 27.4* 23.7* 24.3*  MCV 92.8  --  92.6  --   --   --  98.2 89.8 91.4  PLT 205  --  176  --   --   --  94* 217 216   < > = values in this interval not displayed.   Basic Metabolic Panel: Recent Labs  Lab 04/19/20 0541 04/19/20 0541 04/20/20 0457 04/22/20 2051 04/22/20 2335 04/23/20 0604 04/23/20 1133 04/24/20 0509 04/25/20 0819  NA 142   < > 140   < > 129* 132* 136 136 135  K 4.0   < > 4.5   < > 4.9 3.5 3.3* 4.0 4.7  CL 115*   < > 113*   < > 99 104 108 106 107  CO2 20*   < > 21*  --  12* 17* 17* 18* 18*  GLUCOSE 60*   < > 207*   < > 900* 473* 100* 110* 202*  BUN 32*   < > 35*   < > 64* 63* 61* 57* 59*  CREATININE 2.99*   < > 3.08*   < > 4.75* 4.75* 4.60* 4.31* 3.82*  CALCIUM 8.2*   < > 7.9*  --  8.1* 7.9* 8.0* 7.8* 7.7*  MG 1.8  --  1.9  --   --   --   --  1.9  --   PHOS  --   --   --   --   --   --   --  4.4  --    < > = values in this interval not displayed.   GFR: Estimated Creatinine Clearance: 15.6 mL/min (A) (by C-G formula based on SCr  of 3.82 mg/dL (H)). Liver Function Tests: No results for input(s): AST, ALT, ALKPHOS, BILITOT, PROT, ALBUMIN in the last 168 hours. No results for input(s): LIPASE, AMYLASE in the last  168 hours. No results for input(s): AMMONIA in the last 168 hours. Coagulation Profile: No results for input(s): INR, PROTIME in the last 168 hours. Cardiac Enzymes: No results for input(s): CKTOTAL, CKMB, CKMBINDEX, TROPONINI in the last 168 hours. BNP (last 3 results) No results for input(s): PROBNP in the last 8760 hours. HbA1C: No results for input(s): HGBA1C in the last 72 hours. CBG: Recent Labs  Lab 04/24/20 1946 04/24/20 2314 04/25/20 0316 04/25/20 0732 04/25/20 1149  GLUCAP 390* 310* 142* 74 255*   Lipid Profile: No results for input(s): CHOL, HDL, LDLCALC, TRIG, CHOLHDL, LDLDIRECT in the last 72 hours. Thyroid Function Tests: No results for input(s): TSH, T4TOTAL, FREET4, T3FREE, THYROIDAB in the last 72 hours. Anemia Panel: No results for input(s): VITAMINB12, FOLATE, FERRITIN, TIBC, IRON, RETICCTPCT in the last 72 hours. Sepsis Labs: Recent Labs  Lab 04/22/20 2040 04/22/20 2335  LATICACIDVEN 3.6* 2.7*    Recent Results (from the past 240 hour(s))  SARS Coronavirus 2 by RT PCR (hospital order, performed in [facility] hospital lab) Nasopharyngeal Nasopharyngeal Swab     Status: None   Collection Time: 04/20/20 12:13 PM   Specimen: Nasopharyngeal Swab  Result Value Ref Range Status   SARS Coronavirus 2 NEGATIVE NEGATIVE Final    Comment: (NOTE) SARS-CoV-2 target nucleic acids are NOT DETECTED.  The SARS-CoV-2 RNA is generally detectable in upper and lower respiratory specimens during the acute phase of infection. The lowest concentration of SARS-CoV-2 viral copies this assay can detect is 250 copies / mL. A negative result does not preclude SARS-CoV-2 infection and should not be used as the sole basis for treatment or other patient management decisions.  A negative result  may occur with improper specimen collection / handling, submission of specimen other than nasopharyngeal swab, presence of viral mutation(s) within the areas targeted by this assay, and inadequate number of viral copies (<250 copies / mL). A negative result must be combined with clinical observations, patient history, and epidemiological information.  Fact Sheet for Patients:   StrictlyIdeas.no  Fact Sheet for Healthcare Providers: BankingDealers.co.za  This test is not yet approved or  cleared by the [place] FDA and has been authorized for detection and/or diagnosis of SARS-CoV-2 by FDA under an Emergency Use Authorization (EUA).  This EUA will remain in effect (meaning this test can be used) for the duration of the COVID-19 declaration under Section 564(b)(1) of the Act, 21 U.S.C. section 360bbb-3(b)(1), unless the authorization is terminated or revoked sooner.  Performed at [facility], [place] [address], [place], [place] [address]   SARS Coronavirus 2 by RT PCR (hospital order, performed in [facility] hospital lab) Nasopharyngeal Nasopharyngeal Swab     Status: None   Collection Time: 04/23/20 12:10 AM   Specimen: Nasopharyngeal Swab  Result Value Ref Range Status   SARS Coronavirus 2 NEGATIVE NEGATIVE Final    Comment: (NOTE) SARS-CoV-2 target nucleic acids are NOT DETECTED.  The SARS-CoV-2 RNA is generally detectable in upper and lower respiratory specimens during the acute phase of infection. The lowest concentration of SARS-CoV-2 viral copies this assay can detect is 250 copies / mL. A negative result does not preclude SARS-CoV-2 infection and should not be used as the sole basis for treatment or other patient management decisions.  A negative result may occur with improper specimen collection / handling, submission of specimen other than nasopharyngeal swab, presence of viral mutation(s) within  the areas targeted by this assay, and inadequate number of viral copies (<250  copies / mL). A negative result must be combined with clinical observations, patient history, and epidemiological information.  Fact Sheet for Patients:   StrictlyIdeas.no  Fact Sheet for Healthcare Providers: BankingDealers.co.za  This test is not yet approved or  cleared by the [place] FDA and has been authorized for detection and/or diagnosis of SARS-CoV-2 by FDA under an Emergency Use Authorization (EUA).  This EUA will remain in effect (meaning this test can be used) for the duration of the COVID-19 declaration under Section 564(b)(1) of the Act, 21 U.S.C. section 360bbb-3(b)(1), unless the authorization is terminated or revoked sooner.  Performed at [facility], [place] [address] [place], [place] [address]          Radiology Studies: No results found.      Scheduled Meds: . aspirin EC  81 mg Oral Daily  . atorvastatin  40 mg Oral Daily  . insulin aspart  1-6 Units Subcutaneous Q4H  . insulin glargine  4 Units Subcutaneous QHS  . levothyroxine  25 mcg Oral Q0600  . sodium bicarbonate  650 mg Oral BID   Continuous Infusions: . sodium chloride Stopped (04/23/20 1754)     LOS: 2 days    Time spent: 30 minutes    Barb Merino, MD [facility] Pager [phone]

## 2020-04-25 NOTE — Evaluation (Signed)
Physical Therapy Evaluation Patient Details Name: [name] [name] MRN: [id number] DOB: 1946-01-04 Today's Date: 04/25/2020   History of Present Illness  74 y.o. male admitted on 04/22/20 for hyperglycemia.  Dx formally with DKA, brittle DM, AKI on CKD stage IV, thrombocytopenia.  Pt with significant PMH of stroke, HTN, L femur Fx s/p IM nail, R THA.    Clinical Impression  Pt remains appropriate for short term rehab.  He is impulsive, at high risk of falls and even with RW required mod assist to walk around his room.  His cognition is likely his baseline d/t h/o stroke.   PT to follow acutely for deficits listed below.      Follow Up Recommendations SNF    Equipment Recommendations  Rolling walker with 5" wheels    Recommendations for Other Services   NA    Precautions / Restrictions Precautions Precautions: Fall Precaution Comments: impulsive and unsteady, reports no h/o falls      Mobility  Bed Mobility Overal bed mobility: Needs Assistance Bed Mobility: Supine to Sit;Sit to Supine     Supine to sit: Min assist Sit to supine: Supervision   General bed mobility comments: Min hand held assist to pull up to sitting EOB, manages legs on his own,  returned to supine without assist, supervision for safety.   Transfers Overall transfer level: Needs assistance Equipment used: Rolling walker (2 wheeled);None Transfers: Sit to/from Stand Sit to Stand: Min assist         General transfer comment: Min assist to power up to stand and help control descent to sit.  Cues for safe hand placement needed multiple repetitions/reinforcement.   Ambulation/Gait Ambulation/Gait assistance: Mod assist Gait Distance (Feet): 10 Feet (x2) Assistive device: None;Rolling walker (2 wheeled) Gait Pattern/deviations: Step-through pattern;Staggering left;Staggering right;Trunk flexed     General Gait Details: Attempted gait without AD since pt reported not using one at baseline.  Up to mod assist for  balance during gait and pt reaching impulsively for support of both hands, so we sat down and tried again with RW, which was better, but still required mod assist to help support him and safely control the RW (he was outside of the wheels, pushing it too far ahead).          Balance Overall balance assessment: Needs assistance Sitting-balance support: Feet supported;No upper extremity supported Sitting balance-Leahy Scale: Fair     Standing balance support: Single extremity supported;Bilateral upper extremity supported;No upper extremity supported Standing balance-Leahy Scale: Poor Standing balance comment: needs external support in standing.                              Pertinent Vitals/Pain Pain Assessment: No/denies pain    Home Living Family/patient expects to be discharged to:: Skilled nursing facility Living Arrangements: Alone Available Help at Discharge: Family;Available PRN/intermittently Type of Home: Apartment Home Access: Elevator     Home Layout: One level Home Equipment: None Additional Comments: Home set-up from prior medical record. Pt denies having a RW     Prior Function Level of Independence: Needs assistance   Gait / Transfers Assistance Needed: pt reports independence with ambulation sans AD; at end of session RN reports pts son states pt is supposed to use RW but generally does not (per prior admission hx)           Hand Dominance   Dominant Hand: Right    Extremity/Trunk Assessment   Upper Extremity Assessment  Upper Extremity Assessment: Defer to OT evaluation    Lower Extremity Assessment Lower Extremity Assessment: LLE deficits/detail LLE Deficits / Details: left leg with increased weakness and slower activation compared to R leg, 4/5 per seated MMT    Cervical / Trunk Assessment Cervical / Trunk Assessment: Normal  Communication   Communication: Expressive difficulties (from previous stroke)  Cognition  Arousal/Alertness: Awake/alert Behavior During Therapy: WFL for tasks assessed/performed Overall Cognitive Status: History of cognitive impairments - at baseline                                               Assessment/Plan    PT Assessment Patient needs continued PT services  PT Problem List Decreased strength;Decreased activity tolerance;Decreased balance;Decreased mobility;Decreased cognition;Decreased knowledge of use of DME;Decreased safety awareness;Decreased knowledge of precautions       PT Treatment Interventions DME instruction;Gait training;Functional mobility training;Therapeutic activities;Therapeutic exercise;Balance training;Cognitive remediation;Patient/family education    PT Goals (Current goals can be found in the Care Plan section)  Acute Rehab PT Goals Patient Stated Goal: to go home PT Goal Formulation: With patient Time For Goal Achievement: 05/09/20 Potential to Achieve Goals: Good    Frequency Min 2X/week           AM-PAC PT "6 Clicks" Mobility  Outcome Measure Help needed turning from your back to your side while in a flat bed without using bedrails?: A Little Help needed moving from lying on your back to sitting on the side of a flat bed without using bedrails?: A Little Help needed moving to and from a bed to a chair (including a wheelchair)?: A Little Help needed standing up from a chair using your arms (e.g., wheelchair or bedside chair)?: A Little Help needed to walk in hospital room?: A Lot Help needed climbing 3-5 steps with a railing? : A Lot 6 Click Score: 16    End of Session   Activity Tolerance: Patient tolerated treatment well Patient left: in bed;with call bell/phone within reach;with bed alarm set Nurse Communication: Mobility status PT Visit Diagnosis: Unsteadiness on feet (R26.81);Muscle weakness (generalized) (M62.81);Difficulty in walking, not elsewhere classified (R26.2)    Time: 3244-0102 PT Time  Calculation (min) (ACUTE ONLY): 28 min   Charges:         Verdene Lennert, PT, DPT  Acute Rehabilitation [phone] pager #[phone] office     PT Evaluation $PT Eval Moderate Complexity: 1 Mod PT Treatments $Therapeutic Activity: 8-22 mins

## 2020-04-25 NOTE — NC FL2 (Signed)
[place] LEVEL OF CARE SCREENING TOOL     IDENTIFICATION  Patient Name: [name] [name] Birthdate: 10-21-1945 Sex: male Admission Date (Current Location): 04/22/2020  [facility] and [place] Number:  Herbalist and Address:  [facility] [place]. [facility], [place] [address], [place],  [address]      Provider Number: [id number]  Attending Physician Name and Address:  Barb Merino, MD  Relative Name and Phone Number:  [name] sister, [phone]    Current Level of Care: Hospital Recommended Level of Care: [place] Prior Approval Number:    Date Approved/Denied:   PASRR Number: [id number]  Discharge Plan: SNF    Current Diagnoses: Patient Active Problem List   Diagnosis Date Noted  . Thrombocytopenia ([place]) 04/23/2020  . Hypothyroidism 04/22/2020  . DKA (diabetic ketoacidoses) ([place]) 04/14/2020  . PVD (peripheral vascular disease) ([place]) 02/02/2020  . Poor social situation 12/08/2019  . CKD (chronic kidney disease) stage 4, GFR 15-29 ml/min ([name]) 12/08/2019  . Cognitive decline 11/22/2019  . ETOH abuse 05/17/2019  . Chronic diastolic CHF (congestive heart failure) ([place]) 05/17/2019  . Uncontrolled type 2 diabetes mellitus with hyperglycemia, with long-term current use of insulin ([place]) 10/03/2018  . Underweight 11/18/2017  . Anemia 11/18/2017  . History of cerebrovascular accident (CVA) in adulthood 07/25/2017  . Depression with anxiety 07/25/2017  . Left ventricular diastolic dysfunction 00/92/3300  . Dementia ([place]) 07/25/2017  . Brittle diabetes mellitus ([place]) 06/28/2017  . Hyperlipidemia associated with type 2 diabetes mellitus ([place]) 06/27/2017  . Hypertension associated with diabetes ([place]) 06/27/2017  . Weakness 11/16/2016  . Acute renal failure superimposed on stage 4 chronic kidney disease ([place])   . Noncompliance with medication regimen 08/17/2016  . History of DVT (deep vein thrombosis) 07/06/2016  . Vitamin B 12  deficiency 06/18/2016  . Hyponatremia 05/15/2016  . Anxiety 05/15/2016  . Acute on chronic renal failure ([place]) 05/09/2016  . Narcotic dependency, continuous ([place]) 11/02/2015  . Uncontrolled type 2 diabetes mellitus with diabetic nephropathy, with long-term current use of insulin ([place]) 08/12/2015  . Anemia of chronic disease 02/02/2013    Orientation RESPIRATION BLADDER Height & Weight     Self, Time, Situation, Place  Normal Continent Weight: 160 lb (72.6 kg) Height:  5\' 7"  (170.2 cm)  BEHAVIORAL SYMPTOMS/MOOD NEUROLOGICAL BOWEL NUTRITION STATUS      Continent Diet (Please see DC Summary)  AMBULATORY STATUS COMMUNICATION OF NEEDS Skin   Extensive Assist Verbally Normal                       Personal Care Assistance Level of Assistance  Bathing, Feeding, Dressing Bathing Assistance: Maximum assistance Feeding assistance: Limited assistance Dressing Assistance: Limited assistance     Functional Limitations Info  Hearing, Speech, Sight Sight Info: Adequate Hearing Info: Adequate Speech Info: Impaired    SPECIAL CARE FACTORS FREQUENCY  PT (By licensed PT), OT (By licensed OT)     PT Frequency: 5x/week OT Frequency: 5x/week            Contractures Contractures Info: Not present    Additional Factors Info  Code Status, Allergies, Insulin Sliding Scale Code Status Info: DNR Allergies Info: NKA   Insulin Sliding Scale Info: See dc summary for dose       Current Medications (04/25/2020):  This is the current hospital active medication list Current Facility-Administered Medications  Medication Dose Route Frequency Provider Last Rate Last Admin  . aspirin EC tablet 81 mg  81  mg Oral Daily Lenore Cordia, MD   81 mg at 04/25/20 0924  . atorvastatin (LIPITOR) tablet 40 mg  40 mg Oral Daily Lenore Cordia, MD   40 mg at 04/25/20 0924  . dextrose 50 % solution 0-50 mL  0-50 mL Intravenous PRN Zada Finders R, MD      . insulin aspart (novoLOG) injection 1-6 Units   1-6 Units Subcutaneous Q4H Barb Merino, MD   4 Units at 04/25/20 0123  . insulin glargine (LANTUS) injection 4 Units  4 Units Subcutaneous QHS Barb Merino, MD   4 Units at 04/24/20 2123  . levothyroxine (SYNTHROID) tablet 25 mcg  25 mcg Oral Q0600 Lenore Cordia, MD   25 mcg at 04/25/20 0505  . sodium bicarbonate tablet 650 mg  650 mg Oral BID Lenore Cordia, MD   650 mg at 04/25/20 0924  . sodium chloride 0.9 % bolus 1,000 mL  1,000 mL Intravenous Once Lenore Cordia, MD   Stopped at 04/23/20 1754     Discharge Medications: Please see discharge summary for a list of discharge medications.  Relevant Imaging Results:  Relevant Lab Results:   Additional Information JQD:[id number]  Benard Halsted, LCSW

## 2020-04-26 LAB — CBC WITH DIFFERENTIAL/PLATELET
Abs Immature Granulocytes: 0.01 10*3/uL (ref 0.00–0.07)
Basophils Absolute: 0 10*3/uL (ref 0.0–0.1)
Basophils Relative: 1 %
Eosinophils Absolute: 0.3 10*3/uL (ref 0.0–0.5)
Eosinophils Relative: 6 %
HCT: 25.7 % — ABNORMAL LOW (ref 39.0–52.0)
Hemoglobin: 8 g/dL — ABNORMAL LOW (ref 13.0–17.0)
Immature Granulocytes: 0 %
Lymphocytes Relative: 37 %
Lymphs Abs: 2 10*3/uL (ref 0.7–4.0)
MCH: 28.6 pg (ref 26.0–34.0)
MCHC: 31.1 g/dL (ref 30.0–36.0)
MCV: 91.8 fL (ref 80.0–100.0)
Monocytes Absolute: 0.8 10*3/uL (ref 0.1–1.0)
Monocytes Relative: 15 %
Neutro Abs: 2.3 10*3/uL (ref 1.7–7.7)
Neutrophils Relative %: 41 %
Platelets: 194 10*3/uL (ref 150–400)
RBC: 2.8 MIL/uL — ABNORMAL LOW (ref 4.22–5.81)
RDW: 13.9 % (ref 11.5–15.5)
WBC: 5.5 10*3/uL (ref 4.0–10.5)
nRBC: 0 % (ref 0.0–0.2)

## 2020-04-26 LAB — BASIC METABOLIC PANEL
Anion gap: 11 (ref 5–15)
BUN: 61 mg/dL — ABNORMAL HIGH (ref 8–23)
CO2: 19 mmol/L — ABNORMAL LOW (ref 22–32)
Calcium: 8.1 mg/dL — ABNORMAL LOW (ref 8.9–10.3)
Chloride: 107 mmol/L (ref 98–111)
Creatinine, Ser: 3.39 mg/dL — ABNORMAL HIGH (ref 0.61–1.24)
GFR calc Af Amer: 19 mL/min — ABNORMAL LOW (ref >60)
GFR calc non Af Amer: 17 mL/min — ABNORMAL LOW (ref >60)
Glucose, Bld: 124 mg/dL — ABNORMAL HIGH (ref 70–99)
Potassium: 4.6 mmol/L (ref 3.5–5.1)
Sodium: 137 mmol/L (ref 135–145)

## 2020-04-26 LAB — GLUCOSE, CAPILLARY
Glucose-Capillary: 136 mg/dL — ABNORMAL HIGH (ref 70–99)
Glucose-Capillary: 157 mg/dL — ABNORMAL HIGH (ref 70–99)
Glucose-Capillary: 165 mg/dL — ABNORMAL HIGH (ref 70–99)
Glucose-Capillary: 249 mg/dL — ABNORMAL HIGH (ref 70–99)
Glucose-Capillary: 265 mg/dL — ABNORMAL HIGH (ref 70–99)
Glucose-Capillary: 448 mg/dL — ABNORMAL HIGH (ref 70–99)

## 2020-04-26 MED ORDER — INSULIN ASPART 100 UNIT/ML ~~LOC~~ SOLN
8.0000 [IU] | Freq: Once | SUBCUTANEOUS | Status: AC
  Administered 2020-04-26: 8 [IU] via SUBCUTANEOUS

## 2020-04-26 MED ORDER — INSULIN GLARGINE 100 UNIT/ML ~~LOC~~ SOLN
6.0000 [IU] | Freq: Every day | SUBCUTANEOUS | Status: DC
  Administered 2020-04-26 – 2020-04-27 (×2): 6 [IU] via SUBCUTANEOUS
  Filled 2020-04-26 (×4): qty 0.06

## 2020-04-26 NOTE — TOC Progression Note (Signed)
Transition of Care [facility]) - Progression Note    Patient Details  Name: [name] [name] MRN: [id number] Date of Birth: [birth year]  Transition of Care [facility]) CM/SW [place], LCSW Phone Number: 04/26/2020, 2:41 PM  Clinical Narrative:    SNF insurance approval is still pending. Will continue to follow.         Expected Discharge Plan and Services                                                 Social Determinants of Health (SDOH) Interventions    Readmission Risk Interventions Readmission Risk Prevention Plan 04/20/2020  Transportation Screening Complete  Medication Review [employer]) Complete  SW Recovery Care/Counseling Consult Complete  Palliative Care Screening Complete  Skilled Nursing Facility Complete  Some recent data might be hidden

## 2020-04-26 NOTE — Progress Notes (Signed)
PROGRESS NOTE    [name] [name]  URK:[id number] DOB: September 25, 1946 DOA: 04/22/2020 PCP: Libby Maw, MD    Brief Narrative:  74 year old gentleman with extensive medical issues currently in nursing home, uncontrolled insulin-dependent diabetes, stage IV chronic kidney disease, history of CVA with cognitive dysfunction, hypertension, hyperlipidemia and hypothyroidism admitted from nursing home with hyperglycemia.  In February 2021, he had 2 admissions for hypoglycemic episodes 7/3-7/9, admitted with DKA and metabolic encephalopathy and discharged to nursing home.  Brittle diabetes.  He was discharged on very low doses of insulin. Brought back to the ER with worsening blood sugars.  In the emergency room hemodynamically stable.  On room air.  Blood glucose more than 700 with pH 7.12.  Fluid resuscitation and started on insulin drip. Anion gap closed and transition to subcu insulin, patient developed hypoglycemic episodes.    Assessment & Plan:   Principal Problem:   DKA (diabetic ketoacidoses) ([place]) Active Problems:   Anemia of chronic disease   Uncontrolled type 2 diabetes mellitus with diabetic nephropathy, with long-term current use of insulin ([name])   Acute renal failure superimposed on stage 4 chronic kidney disease ([name])   Hyperlipidemia associated with type 2 diabetes mellitus ([place])   Hypertension associated with diabetes ([place])   History of cerebrovascular accident (CVA) in adulthood   Hypothyroidism   Thrombocytopenia ([place])  DKA, uncontrolled insulin-dependent diabetes, brittle diabetes:  Patient discharged to a skilled nursing facility on Lantus 4 units and sliding scale insulin.  Comes with blood sugars more than 700.  Unreliable oral intake and stage IV chronic kidney disease makes very difficult to balance his blood sugars.   His kidney functions has stabilized.  His oral intake has been stable and better today.  Blood sugars have been more than 150.  No hypoglycemic  episodes last 24 hours. Increase Lantus to 6 units at night.  We will keep on customized sliding scale.  If blood sugars remain less than 180, we will keep on this is scheduled.  AKI on CKD stage IV: Due to dehydration.  Treated with IV fluids.  Now at about baseline.  Hypertension: Blood pressures picking up.  Will add antihypertensives from his home regimen.  History of CVA with mild cognitive dysfunction: Chronic and stable.  Continue aspirin and Lipitor.  Thrombocytopenia: Normalized.  Hypothyroidism: On Synthroid that he will continue.  Chronic debility: Needing additional support.  Currently at nursing home.  Will go back to nursing home. Work with PT OT .   DVT prophylaxis: SCDs Start: 04/23/20 0049   Code Status: DNR Family Communication: Sister on the phone, 7/12 Disposition Plan: Status is: Inpatient  Remains inpatient appropriate because:Persistent severe electrolyte disturbances and IV treatments appropriate due to intensity of illness or inability to take PO   Dispo: The patient is from: SNF              Anticipated d/c is to: SNF              Anticipated d/c date is: Today              Patient currently medically stable to discharge to a skilled level of care.   Consultants:   None  Procedures:   None  Antimicrobials:   None   Subjective: Seen and examined.  No overnight events.  Eating breakfast.  He really does not like to go back to a skilled nursing facility and wants to go home.  Patient is a still moderate assist, impulsive and fall risk  on mobility. Blood sugars are adequate and most of the blood sugars more than 200.  He has good appetite now.  Objective: Vitals:   04/26/20 0000 04/26/20 0100 04/26/20 0400 04/26/20 0753  BP: (!) 165/91 (!) 145/88 136/64 (!) 180/94  Pulse: 65 63 60 61  Resp: 20 20 18 20   Temp: 98.5 F (36.9 C)  98.7 F (37.1 C) 98.6 F (37 C)  TempSrc: Oral  Oral Oral  SpO2: 95% 98% 97% 98%  Weight:      Height:         Intake/Output Summary (Last 24 hours) at 04/26/2020 0828 Last data filed at 04/26/2020 6803 Gross per 24 hour  Intake 1220 ml  Output 2130 ml  Net -910 ml   Filed Weights   04/22/20 1943  Weight: 72.6 kg    Examination:  Physical Exam Constitutional:      Appearance: Normal appearance.     Comments: Patient looks fairly comfortable and eating breakfast.  He has some cognitive deficits, however today he is alert oriented x3.  HENT:     Head: Normocephalic.  Cardiovascular:     Rate and Rhythm: Regular rhythm.  Pulmonary:     Breath sounds: Normal breath sounds.  Musculoskeletal:        General: Normal range of motion.  Neurological:     General: No focal deficit present.     Mental Status: He is alert. Mental status is at baseline.        Data Reviewed: I have personally reviewed following labs and imaging studies  CBC: Recent Labs  Lab 04/20/20 0457 04/22/20 2051 04/22/20 2052 04/22/20 2335 04/24/20 0509 04/25/20 0819 04/26/20 0451  WBC 5.0  --   --  7.9 7.4 5.4 5.5  NEUTROABS 2.3  --   --   --  3.8 2.5 2.3  HGB 8.1*   < > 9.2* 8.1* 7.9* 7.7* 8.0*  HCT 26.3*   < > 27.0* 27.4* 23.7* 24.3* 25.7*  MCV 92.6  --   --  98.2 89.8 91.4 91.8  PLT 176  --   --  94* 217 216 194   < > = values in this interval not displayed.   Basic Metabolic Panel: Recent Labs  Lab 04/20/20 0457 04/22/20 2051 04/23/20 0604 04/23/20 1133 04/24/20 0509 04/25/20 0819 04/26/20 0451  NA 140   < > 132* 136 136 135 137  K 4.5   < > 3.5 3.3* 4.0 4.7 4.6  CL 113*   < > 104 108 106 107 107  CO2 21*   < > 17* 17* 18* 18* 19*  GLUCOSE 207*   < > 473* 100* 110* 202* 124*  BUN 35*   < > 63* 61* 57* 59* 61*  CREATININE 3.08*   < > 4.75* 4.60* 4.31* 3.82* 3.39*  CALCIUM 7.9*   < > 7.9* 8.0* 7.8* 7.7* 8.1*  MG 1.9  --   --   --  1.9  --   --   PHOS  --   --   --   --  4.4  --   --    < > = values in this interval not displayed.   GFR: Estimated Creatinine Clearance: 17.6 mL/min  (A) (by C-G formula based on SCr of 3.39 mg/dL (H)). Liver Function Tests: No results for input(s): AST, ALT, ALKPHOS, BILITOT, PROT, ALBUMIN in the last 168 hours. No results for input(s): LIPASE, AMYLASE in the last 168 hours. No results for input(s):  AMMONIA in the last 168 hours. Coagulation Profile: No results for input(s): INR, PROTIME in the last 168 hours. Cardiac Enzymes: No results for input(s): CKTOTAL, CKMB, CKMBINDEX, TROPONINI in the last 168 hours. BNP (last 3 results) No results for input(s): PROBNP in the last 8760 hours. HbA1C: No results for input(s): HGBA1C in the last 72 hours. CBG: Recent Labs  Lab 04/25/20 1609 04/25/20 1956 04/26/20 0006 04/26/20 0411 04/26/20 0752  GLUCAP 147* 387* 265* 136* 157*   Lipid Profile: No results for input(s): CHOL, HDL, LDLCALC, TRIG, CHOLHDL, LDLDIRECT in the last 72 hours. Thyroid Function Tests: No results for input(s): TSH, T4TOTAL, FREET4, T3FREE, THYROIDAB in the last 72 hours. Anemia Panel: No results for input(s): VITAMINB12, FOLATE, FERRITIN, TIBC, IRON, RETICCTPCT in the last 72 hours. Sepsis Labs: Recent Labs  Lab 04/22/20 2040 04/22/20 2335  LATICACIDVEN 3.6* 2.7*    Recent Results (from the past 240 hour(s))  SARS Coronavirus 2 by RT PCR (hospital order, performed in [facility] hospital lab) Nasopharyngeal Nasopharyngeal Swab     Status: None   Collection Time: 04/20/20 12:13 PM   Specimen: Nasopharyngeal Swab  Result Value Ref Range Status   SARS Coronavirus 2 NEGATIVE NEGATIVE Final    Comment: (NOTE) SARS-CoV-2 target nucleic acids are NOT DETECTED.  The SARS-CoV-2 RNA is generally detectable in upper and lower respiratory specimens during the acute phase of infection. The lowest concentration of SARS-CoV-2 viral copies this assay can detect is 250 copies / mL. A negative result does not preclude SARS-CoV-2 infection and should not be used as the sole basis for treatment or other patient  management decisions.  A negative result may occur with improper specimen collection / handling, submission of specimen other than nasopharyngeal swab, presence of viral mutation(s) within the areas targeted by this assay, and inadequate number of viral copies (<250 copies / mL). A negative result must be combined with clinical observations, patient history, and epidemiological information.  Fact Sheet for Patients:   StrictlyIdeas.no  Fact Sheet for Healthcare Providers: BankingDealers.co.za  This test is not yet approved or  cleared by the [place] FDA and has been authorized for detection and/or diagnosis of SARS-CoV-2 by FDA under an Emergency Use Authorization (EUA).  This EUA will remain in effect (meaning this test can be used) for the duration of the COVID-19 declaration under Section 564(b)(1) of the Act, 21 U.S.C. section 360bbb-3(b)(1), unless the authorization is terminated or revoked sooner.  Performed at [facility], [place] [address], [place], [place] [address]   SARS Coronavirus 2 by RT PCR (hospital order, performed in [facility] hospital lab) Nasopharyngeal Nasopharyngeal Swab     Status: None   Collection Time: 04/23/20 12:10 AM   Specimen: Nasopharyngeal Swab  Result Value Ref Range Status   SARS Coronavirus 2 NEGATIVE NEGATIVE Final    Comment: (NOTE) SARS-CoV-2 target nucleic acids are NOT DETECTED.  The SARS-CoV-2 RNA is generally detectable in upper and lower respiratory specimens during the acute phase of infection. The lowest concentration of SARS-CoV-2 viral copies this assay can detect is 250 copies / mL. A negative result does not preclude SARS-CoV-2 infection and should not be used as the sole basis for treatment or other patient management decisions.  A negative result may occur with improper specimen collection / handling, submission of specimen other than nasopharyngeal swab,  presence of viral mutation(s) within the areas targeted by this assay, and inadequate number of viral copies (<250 copies / mL). A negative result  must be combined with clinical observations, patient history, and epidemiological information.  Fact Sheet for Patients:   StrictlyIdeas.no  Fact Sheet for Healthcare Providers: BankingDealers.co.za  This test is not yet approved or  cleared by the [place] FDA and has been authorized for detection and/or diagnosis of SARS-CoV-2 by FDA under an Emergency Use Authorization (EUA).  This EUA will remain in effect (meaning this test can be used) for the duration of the COVID-19 declaration under Section 564(b)(1) of the Act, 21 U.S.C. section 360bbb-3(b)(1), unless the authorization is terminated or revoked sooner.  Performed at [facility], [place] [address], [place], [place] [address]          Radiology Studies: No results found.      Scheduled Meds: . aspirin EC  81 mg Oral Daily  . atorvastatin  40 mg Oral Daily  . insulin aspart  1-6 Units Subcutaneous Q4H  . insulin glargine  6 Units Subcutaneous QHS  . levothyroxine  25 mcg Oral Q0600  . sodium bicarbonate  650 mg Oral BID   Continuous Infusions: . sodium chloride Stopped (04/23/20 1754)     LOS: 3 days    Time spent: 25 minutes    Barb Merino, MD [facility] Pager [phone]

## 2020-04-26 NOTE — Progress Notes (Signed)
Occupational Therapy Evaluation Patient Details Name: [name] [name] MRN: [id number] DOB: 1946/02/11 Today's Date: 04/26/2020    History of Present Illness 74 y.o. male admitted on 04/22/20 for hyperglycemia.  Dx formally with DKA, brittle DM, AKI on CKD stage IV, thrombocytopenia.  Pt with significant PMH of stroke, HTN, L femur Fx s/p IM nail, R THA.     Clinical Impression   Patient here from nursing home where patient states he does not need assist, though patient is poor historian.  Initially nice and agreeable to therapy and abruptly became agitated/restless when suggesting OOB mobility.  Patient yelling he is fine and able to move, kicking legs high in air while laying.  Contradicting himself shortly after stating he needs help at home.  Will need to further eval OOB mobility and functional level, but patient likely requiring verbal cues for sequencing and safety, and mod assist with LB ADLs based on clinical judgement and PT note.  Will continue to follow with OT acutely to address the deficits listed below.      Follow Up Recommendations  SNF;Supervision/Assistance - 24 hour    Equipment Recommendations  Other (comment) (defer to next venue)    Recommendations for Other Services       Precautions / Restrictions Precautions Precautions: Fall Precaution Comments: impulsive and unsteady, reports no h/o falls      Mobility Bed Mobility Overal bed mobility: Needs Assistance             General bed mobility comments: Patient able to kick legs above his head while in bed.    Transfers                 General transfer comment: refused    Balance                                           ADL either performed or assessed with clinical judgement   ADL Overall ADL's : Needs assistance/impaired                                       General ADL Comments: Patient refusing mobility and ADLs.  Using clinical judgement patient will  likely require set up and supervision for seated ADLs and mod assist with LB ALDs, all requiring verbal cueing fo rsafety and sequencing      Vision Baseline Vision/History: No visual deficits Patient Visual Report: No change from baseline Vision Assessment?: No apparent visual deficits     Perception     Praxis      Pertinent Vitals/Pain Pain Assessment: Faces Faces Pain Scale: No hurt     Hand Dominance     Extremity/Trunk Assessment Upper Extremity Assessment Upper Extremity Assessment: Overall WFL for tasks assessed   Lower Extremity Assessment Lower Extremity Assessment: Defer to PT evaluation       Communication     Cognition Arousal/Alertness: Awake/alert Behavior During Therapy: Restless;Agitated Overall Cognitive Status: History of cognitive impairments - at baseline Area of Impairment: Memory;Following commands;Attention;Safety/judgement;Problem solving;Awareness                   Current Attention Level: Focused Memory: Decreased recall of precautions;Decreased short-term memory Following Commands: Follows one step commands inconsistently Safety/Judgement: Decreased awareness of safety;Decreased awareness of deficits Awareness: Intellectual Problem Solving: Slow processing;Difficulty sequencing;Requires  verbal cues;Requires tactile cues General Comments: Patient consistently refusing therapy yelling he is moving just fine and kicking his legs everywhere while in the bed.  Unable to accept reason of therapy.     General Comments       Exercises     Shoulder Instructions      Home Living Family/patient expects to be discharged to:: Skilled nursing facility Living Arrangements: Alone Available Help at Discharge: Family;Available PRN/intermittently Type of Home: Apartment Home Access: Elevator     Home Layout: One level     Bathroom Shower/Tub: Occupational psychologist: Standard     Home Equipment: None   Additional Comments:  Home set-up from prior medical record. Pt denies having a RW       Prior Functioning/Environment Level of Independence: Needs assistance  Gait / Transfers Assistance Needed: RN reports son says patient is supporsed to use RW but does not ADL's / Homemaking Assistance Needed: pt reports independence in all ADLs Communication / Swallowing Assistance Needed: speech slurred and garbled somewhat difficult to understand          OT Problem List: Decreased strength;Decreased activity tolerance;Impaired balance (sitting and/or standing);Decreased coordination;Decreased cognition;Decreased safety awareness;Decreased knowledge of use of DME or AE;Decreased knowledge of precautions      OT Treatment/Interventions: Self-care/ADL training;Therapeutic exercise;DME and/or AE instruction;Therapeutic activities;Cognitive remediation/compensation;Patient/family education;Balance training    OT Goals(Current goals can be found in the care plan section) Acute Rehab OT Goals Patient Stated Goal: to go home OT Goal Formulation: With patient Time For Goal Achievement: 05/10/20 Potential to Achieve Goals: Fair  OT Frequency: Min 2X/week   Barriers to D/C:            Co-evaluation              AM-PAC OT "6 Clicks" Daily Activity     Outcome Measure Help from another person eating meals?: None Help from another person taking care of personal grooming?: A Lot Help from another person toileting, which includes using toliet, bedpan, or urinal?: A Lot Help from another person bathing (including washing, rinsing, drying)?: A Lot Help from another person to put on and taking off regular upper body clothing?: A Little Help from another person to put on and taking off regular lower body clothing?: A Lot 6 Click Score: 15   End of Session Nurse Communication: Mobility status  Activity Tolerance: Treatment limited secondary to agitation Patient left: in bed;with call bell/phone within reach;with bed  alarm set  OT Visit Diagnosis: Unsteadiness on feet (R26.81);Other abnormalities of gait and mobility (R26.89);Muscle weakness (generalized) (M62.81);Other symptoms and signs involving cognitive function                Time: 0955-1010 OT Time Calculation (min): 15 min Charges:  OT General Charges $OT Visit: 1 Visit OT Evaluation $OT Eval Moderate Complexity: 1 [address] OTR/L   Phylliss Bob 04/26/2020, 10:16 AM

## 2020-04-27 LAB — CBC WITH DIFFERENTIAL/PLATELET
Abs Immature Granulocytes: 0.02 10*3/uL (ref 0.00–0.07)
Basophils Absolute: 0.1 10*3/uL (ref 0.0–0.1)
Basophils Relative: 1 %
Eosinophils Absolute: 0.3 10*3/uL (ref 0.0–0.5)
Eosinophils Relative: 5 %
HCT: 25.7 % — ABNORMAL LOW (ref 39.0–52.0)
Hemoglobin: 8.3 g/dL — ABNORMAL LOW (ref 13.0–17.0)
Immature Granulocytes: 0 %
Lymphocytes Relative: 37 %
Lymphs Abs: 2.5 10*3/uL (ref 0.7–4.0)
MCH: 29.5 pg (ref 26.0–34.0)
MCHC: 32.3 g/dL (ref 30.0–36.0)
MCV: 91.5 fL (ref 80.0–100.0)
Monocytes Absolute: 0.9 10*3/uL (ref 0.1–1.0)
Monocytes Relative: 14 %
Neutro Abs: 2.9 10*3/uL (ref 1.7–7.7)
Neutrophils Relative %: 43 %
Platelets: 195 10*3/uL (ref 150–400)
RBC: 2.81 MIL/uL — ABNORMAL LOW (ref 4.22–5.81)
RDW: 13.7 % (ref 11.5–15.5)
WBC: 6.6 10*3/uL (ref 4.0–10.5)
nRBC: 0 % (ref 0.0–0.2)

## 2020-04-27 LAB — GLUCOSE, CAPILLARY
Glucose-Capillary: 104 mg/dL — ABNORMAL HIGH (ref 70–99)
Glucose-Capillary: 145 mg/dL — ABNORMAL HIGH (ref 70–99)
Glucose-Capillary: 186 mg/dL — ABNORMAL HIGH (ref 70–99)
Glucose-Capillary: 227 mg/dL — ABNORMAL HIGH (ref 70–99)
Glucose-Capillary: 229 mg/dL — ABNORMAL HIGH (ref 70–99)
Glucose-Capillary: 237 mg/dL — ABNORMAL HIGH (ref 70–99)
Glucose-Capillary: 368 mg/dL — ABNORMAL HIGH (ref 70–99)
Glucose-Capillary: 56 mg/dL — ABNORMAL LOW (ref 70–99)
Glucose-Capillary: 67 mg/dL — ABNORMAL LOW (ref 70–99)

## 2020-04-27 NOTE — TOC Progression Note (Signed)
Transition of Care [facility]) - Progression Note    Patient Details  Name: [name] [name] MRN: [id number] Date of Birth: [birth year]  Transition of [facility]) CM/SW Jansen, LCSW Phone Number: 04/27/2020, 1:30 PM  Clinical Narrative:    CSW received insurance approval: [id number], [id number], effective through 05/01/20. Per MD, patient not yet ready for discharge. CSW made patient's sister and [employer] aware.    Expected Discharge Plan: [place] Barriers to Discharge: Continued Medical Work up  Expected Discharge Plan and Services Expected Discharge Plan: [place] Choice: [place] arrangements for the past 2 months: Apartment                                       Social Determinants of Health (SDOH) Interventions    Readmission Risk Interventions Readmission Risk Prevention Plan 04/20/2020  Transportation Screening Complete  Medication Review Press photographer) Complete  SW Recovery Care/Counseling Consult Complete  Palliative Care Screening Complete  Skilled Nursing Facility Complete  Some recent data might be hidden

## 2020-04-27 NOTE — Progress Notes (Signed)
Inpatient Diabetes Program Recommendations  AACE/ADA: New Consensus Statement on Inpatient Glycemic Control (2015)  Target Ranges:  Prepandial:   less than 140 mg/dL      Peak postprandial:   less than 180 mg/dL (1-2 hours)      Critically ill patients:  140 - 180 mg/dL   Lab Results  Component Value Date   GLUCAP 186 (H) 04/27/2020   HGBA1C 10.3 (H) 03/24/2020    Review of Glycemic Control Results for [name], [name] [name] (MRN [id number]) as of 04/27/2020 09:51  Ref. Range 04/27/2020 04:27 04/27/2020 04:28 04/27/2020 05:20 04/27/2020 07:38  Glucose-Capillary Latest Ref Range: 70 - 99 mg/dL 56 (L) 67 (L) 104 (H) 186 (H)   Diabetes history:DM1 Outpatient Diabetes medications:Lantus 4 units Current orders for Inpatient glycemic control:Novolog correction 0-6 units q 4 hrs, Lantus 6 units QHS, Novolog 8 units x1  Inpatient Diabetes Program Recommendations:    Noted patient experienced hypoglycemic event of 56 mg/dL following one time dose of Novolog 8 units for a blood sugar of 448 mg/dL. Assuming increased glucose related to carb intake not covered with meal coverage. Noticed that patients glucose trend increases daily at the same time of 2000. Could consider decreasing Lantus to 5 units QHS and adding Novolog 2 units at 1700 for meal coverage (assuming patient is consuming >50% of meal and ONLY with dinner). Will follow.   Thanks, Bronson Curb, MSN, RNC-OB Diabetes Coordinator [phone] (8a-5p)

## 2020-04-27 NOTE — TOC Progression Note (Signed)
Transition of Care [facility]) - Progression Note    Patient Details  Name: Taryn Nave MRN: [id number] Date of Birth: 08/19/1946  Transition of Care [facility]) CM/SW [place], LCSW Phone Number: 04/27/2020, 9:35 AM  Clinical Narrative:    CSW contacted [employer] to check on insurance status. They reported it is still under review. CSW sent updated clinicals.    Expected Discharge Plan: Skilled Nursing Facility Barriers to Discharge: Insurance Authorization  Expected Discharge Plan and Services Expected Discharge Plan: [place] Choice: [place] Living arrangements for the past 2 months: Apartment                                       Social Determinants of Health (SDOH) Interventions    Readmission Risk Interventions Readmission Risk Prevention Plan 04/20/2020  Transportation Screening Complete  Medication Review Press photographer) Complete  SW Recovery Care/Counseling Consult Complete  Palliative Care Screening Complete  Skilled Nursing Facility Complete  Some recent data might be hidden

## 2020-04-27 NOTE — Progress Notes (Signed)
PROGRESS NOTE    [name] [name]  VHQ:[id number] DOB: 1946/04/08 DOA: 04/22/2020 PCP: Libby Maw, MD    Brief Narrative:  74 year old gentleman with extensive medical issues currently in nursing home, uncontrolled insulin-dependent diabetes, stage IV chronic kidney disease, history of CVA with cognitive dysfunction, hypertension, hyperlipidemia and hypothyroidism admitted from nursing home with hyperglycemia.  In February 2021, he had 2 admissions for hypoglycemic episodes 7/3-7/9, admitted with DKA and metabolic encephalopathy and discharged to nursing home.  Brittle diabetes.  He was discharged on very low doses of insulin. Brought back to the ER with worsening blood sugars.  In the emergency room hemodynamically stable.  On room air.  Blood glucose more than 700 with pH 7.12.  Fluid resuscitation and started on insulin drip. Anion gap closed and transition to subcu insulin, patient developed hypoglycemic episodes.  7/16, patient continues to have better control of blood sugars, however last night blood sugars more than 400, additional 8 units of regular insulin caused hypoglycemic episodes in the morning.  Patient asymptomatic.  Assessment & Plan:   Principal Problem:   DKA (diabetic ketoacidoses) ([place]) Active Problems:   Anemia of chronic disease   Uncontrolled type 2 diabetes mellitus with diabetic nephropathy, with long-term current use of insulin ([name])   Acute renal failure superimposed on stage 4 chronic kidney disease ([name])   Hyperlipidemia associated with type 2 diabetes mellitus ([place])   Hypertension associated with diabetes ([place])   History of cerebrovascular accident (CVA) in adulthood   Hypothyroidism   Thrombocytopenia ([place])  DKA, uncontrolled insulin-dependent diabetes, brittle diabetes:  Patient discharged to a skilled nursing facility on Lantus 4 units and sliding scale insulin.  Comes with blood sugars more than 700.  Unreliable oral intake and stage IV chronic  kidney disease makes very difficult to balance his blood sugars.   His kidney functions has stabilized.  His oral intake has been stable and better.  Noted hypoglycemic episodes with additional doses of short acting insulin. Plan: Continue long-acting insulin 6 units at night. We will keep on every 4 hours customized sliding scale with maximum dose of 6 units. His oral intake is not reliably measured, will stay away from scheduled prandial insulin. Will accept some hyperglycemia.  AKI on CKD stage IV: Due to dehydration.  Treated with IV fluids.  Now at about baseline.  Hypertension: Blood pressures better controlled.  History of CVA with mild cognitive dysfunction: Chronic and stable.  Continue aspirin and Lipitor.  Thrombocytopenia: Normalized.  Hypothyroidism: On Synthroid that he will continue.  Chronic debility: Needing additional support.  Currently at nursing home.  Will go back to nursing home. Work with PT OT . He will continue to benefit with inpatient therapies at a skilled nursing facility.   DVT prophylaxis: SCDs Start: 04/23/20 0049   Code Status: DNR Family Communication: Sister on the phone, 7/15.  Mick Sell. Disposition Plan: Status is: Inpatient  Remains inpatient appropriate because:Persistent severe electrolyte disturbances and IV treatments appropriate due to intensity of illness or inability to take PO   Dispo: The patient is from: SNF              Anticipated d/c is to: SNF              Anticipated d/c date is: Today              Patient currently medically stable to discharge to a skilled level of care.   Consultants:   None  Procedures:   None  Antimicrobials:   None   Subjective: Patient seen and examined.  Patient himself denies any complaints.   Received additional 8 units of short acting insulin for blood sugars more than 400 at middle of the night and blood sugars dropped to 56 at 4 AM.  Patient stated good appetite.  Eating most of  the portion of food.   Objective: Vitals:   04/26/20 2031 04/26/20 2359 04/27/20 0422 04/27/20 0700  BP: (!) 163/73 139/83 (!) 161/80 (!) 161/80  Pulse: 61 86 61   Resp: 17 (!) 25 20 (!) 22  Temp: 98.8 F (37.1 C) 99.1 F (37.3 C) 98.1 F (36.7 C) 98.3 F (36.8 C)  TempSrc: Oral Oral Oral Oral  SpO2: 95% 93% 97%   Weight:      Height:        Intake/Output Summary (Last 24 hours) at 04/27/2020 1020 Last data filed at 04/27/2020 0530 Gross per 24 hour  Intake 240 ml  Output 2000 ml  Net -1760 ml   Filed Weights   04/22/20 1943  Weight: 72.6 kg    Examination:  Physical Exam Constitutional:      Appearance: Normal appearance.     Comments: Patient looks fairly comfortable and eating breakfast.  He has some cognitive deficits, however today he is alert oriented x3.  HENT:     Head: Normocephalic.  Cardiovascular:     Rate and Rhythm: Regular rhythm.  Pulmonary:     Breath sounds: Normal breath sounds.  Musculoskeletal:        General: Normal range of motion.  Neurological:     General: No focal deficit present.     Mental Status: He is alert. Mental status is at baseline.        Data Reviewed: I have personally reviewed following labs and imaging studies  CBC: Recent Labs  Lab 04/22/20 2335 04/24/20 0509 04/25/20 0819 04/26/20 0451 04/27/20 0415  WBC 7.9 7.4 5.4 5.5 6.6  NEUTROABS  --  3.8 2.5 2.3 2.9  HGB 8.1* 7.9* 7.7* 8.0* 8.3*  HCT 27.4* 23.7* 24.3* 25.7* 25.7*  MCV 98.2 89.8 91.4 91.8 91.5  PLT 94* 217 216 194 160   Basic Metabolic Panel: Recent Labs  Lab 04/23/20 0604 04/23/20 1133 04/24/20 0509 04/25/20 0819 04/26/20 0451  NA 132* 136 136 135 137  K 3.5 3.3* 4.0 4.7 4.6  CL 104 108 106 107 107  CO2 17* 17* 18* 18* 19*  GLUCOSE 473* 100* 110* 202* 124*  BUN 63* 61* 57* 59* 61*  CREATININE 4.75* 4.60* 4.31* 3.82* 3.39*  CALCIUM 7.9* 8.0* 7.8* 7.7* 8.1*  MG  --   --  1.9  --   --   PHOS  --   --  4.4  --   --    GFR: Estimated  Creatinine Clearance: 17.6 mL/min (A) (by C-G formula based on SCr of 3.39 mg/dL (H)). Liver Function Tests: No results for input(s): AST, ALT, ALKPHOS, BILITOT, PROT, ALBUMIN in the last 168 hours. No results for input(s): LIPASE, AMYLASE in the last 168 hours. No results for input(s): AMMONIA in the last 168 hours. Coagulation Profile: No results for input(s): INR, PROTIME in the last 168 hours. Cardiac Enzymes: No results for input(s): CKTOTAL, CKMB, CKMBINDEX, TROPONINI in the last 168 hours. BNP (last 3 results) No results for input(s): PROBNP in the last 8760 hours. HbA1C: No results for input(s): HGBA1C in the last 72 hours. CBG: Recent Labs  Lab 04/26/20 2357 04/27/20 0427 04/27/20  7867 04/27/20 0520 04/27/20 0738  GLUCAP 368* 56* 67* 104* 186*   Lipid Profile: No results for input(s): CHOL, HDL, LDLCALC, TRIG, CHOLHDL, LDLDIRECT in the last 72 hours. Thyroid Function Tests: No results for input(s): TSH, T4TOTAL, FREET4, T3FREE, THYROIDAB in the last 72 hours. Anemia Panel: No results for input(s): VITAMINB12, FOLATE, FERRITIN, TIBC, IRON, RETICCTPCT in the last 72 hours. Sepsis Labs: Recent Labs  Lab 04/22/20 2040 04/22/20 2335  LATICACIDVEN 3.6* 2.7*    Recent Results (from the past 240 hour(s))  SARS Coronavirus 2 by RT PCR (hospital order, performed in [facility] hospital lab) Nasopharyngeal Nasopharyngeal Swab     Status: None   Collection Time: 04/20/20 12:13 PM   Specimen: Nasopharyngeal Swab  Result Value Ref Range Status   SARS Coronavirus 2 NEGATIVE NEGATIVE Final    Comment: (NOTE) SARS-CoV-2 target nucleic acids are NOT DETECTED.  The SARS-CoV-2 RNA is generally detectable in upper and lower respiratory specimens during the acute phase of infection. The lowest concentration of SARS-CoV-2 viral copies this assay can detect is 250 copies / mL. A negative result does not preclude SARS-CoV-2 infection and should not be used as the sole basis for  treatment or other patient management decisions.  A negative result may occur with improper specimen collection / handling, submission of specimen other than nasopharyngeal swab, presence of viral mutation(s) within the areas targeted by this assay, and inadequate number of viral copies (<250 copies / mL). A negative result must be combined with clinical observations, patient history, and epidemiological information.  Fact Sheet for Patients:   StrictlyIdeas.no  Fact Sheet for Healthcare Providers: BankingDealers.co.za  This test is not yet approved or  cleared by the [place] FDA and has been authorized for detection and/or diagnosis of SARS-CoV-2 by FDA under an Emergency Use Authorization (EUA).  This EUA will remain in effect (meaning this test can be used) for the duration of the COVID-19 declaration under Section 564(b)(1) of the Act, 21 U.S.C. section 360bbb-3(b)(1), unless the authorization is terminated or revoked sooner.  Performed at [facility], [place] [address], [place], [place] [address]   SARS Coronavirus 2 by RT PCR (hospital order, performed in [facility] hospital lab) Nasopharyngeal Nasopharyngeal Swab     Status: None   Collection Time: 04/23/20 12:10 AM   Specimen: Nasopharyngeal Swab  Result Value Ref Range Status   SARS Coronavirus 2 NEGATIVE NEGATIVE Final    Comment: (NOTE) SARS-CoV-2 target nucleic acids are NOT DETECTED.  The SARS-CoV-2 RNA is generally detectable in upper and lower respiratory specimens during the acute phase of infection. The lowest concentration of SARS-CoV-2 viral copies this assay can detect is 250 copies / mL. A negative result does not preclude SARS-CoV-2 infection and should not be used as the sole basis for treatment or other patient management decisions.  A negative result may occur with improper specimen collection / handling, submission of specimen  other than nasopharyngeal swab, presence of viral mutation(s) within the areas targeted by this assay, and inadequate number of viral copies (<250 copies / mL). A negative result must be combined with clinical observations, patient history, and epidemiological information.  Fact Sheet for Patients:   StrictlyIdeas.no  Fact Sheet for Healthcare Providers: BankingDealers.co.za  This test is not yet approved or  cleared by the [place] FDA and has been authorized for detection and/or diagnosis of SARS-CoV-2 by FDA under an Emergency Use Authorization (EUA).  This EUA will remain in effect (meaning this test  can be used) for the duration of the COVID-19 declaration under Section 564(b)(1) of the Act, 21 U.S.C. section 360bbb-3(b)(1), unless the authorization is terminated or revoked sooner.  Performed at [facility], [place] [address] [place], [place] [address]          Radiology Studies: No results found.      Scheduled Meds: . aspirin EC  81 mg Oral Daily  . atorvastatin  40 mg Oral Daily  . insulin aspart  1-6 Units Subcutaneous Q4H  . insulin glargine  6 Units Subcutaneous QHS  . levothyroxine  25 mcg Oral Q0600  . sodium bicarbonate  650 mg Oral BID   Continuous Infusions: . sodium chloride Stopped (04/23/20 1754)     LOS: 4 days    Time spent: 25 minutes    Barb Merino, MD [facility] Pager [phone]

## 2020-04-28 DIAGNOSIS — Z794 Long term (current) use of insulin: Secondary | ICD-10-CM

## 2020-04-28 DIAGNOSIS — D696 Thrombocytopenia, unspecified: Secondary | ICD-10-CM

## 2020-04-28 DIAGNOSIS — N179 Acute kidney failure, unspecified: Secondary | ICD-10-CM

## 2020-04-28 DIAGNOSIS — E1165 Type 2 diabetes mellitus with hyperglycemia: Secondary | ICD-10-CM

## 2020-04-28 DIAGNOSIS — N184 Chronic kidney disease, stage 4 (severe): Secondary | ICD-10-CM

## 2020-04-28 DIAGNOSIS — E1121 Type 2 diabetes mellitus with diabetic nephropathy: Secondary | ICD-10-CM

## 2020-04-28 DIAGNOSIS — D638 Anemia in other chronic diseases classified elsewhere: Secondary | ICD-10-CM

## 2020-04-28 LAB — CBC WITH DIFFERENTIAL/PLATELET
Abs Immature Granulocytes: 0.01 10*3/uL (ref 0.00–0.07)
Basophils Absolute: 0 10*3/uL (ref 0.0–0.1)
Basophils Relative: 1 %
Eosinophils Absolute: 0.3 10*3/uL (ref 0.0–0.5)
Eosinophils Relative: 5 %
HCT: 24.6 % — ABNORMAL LOW (ref 39.0–52.0)
Hemoglobin: 8 g/dL — ABNORMAL LOW (ref 13.0–17.0)
Immature Granulocytes: 0 %
Lymphocytes Relative: 28 %
Lymphs Abs: 1.7 10*3/uL (ref 0.7–4.0)
MCH: 30.1 pg (ref 26.0–34.0)
MCHC: 32.5 g/dL (ref 30.0–36.0)
MCV: 92.5 fL (ref 80.0–100.0)
Monocytes Absolute: 0.7 10*3/uL (ref 0.1–1.0)
Monocytes Relative: 11 %
Neutro Abs: 3.5 10*3/uL (ref 1.7–7.7)
Neutrophils Relative %: 55 %
Platelets: 191 10*3/uL (ref 150–400)
RBC: 2.66 MIL/uL — ABNORMAL LOW (ref 4.22–5.81)
RDW: 13.8 % (ref 11.5–15.5)
WBC: 6.3 10*3/uL (ref 4.0–10.5)
nRBC: 0 % (ref 0.0–0.2)

## 2020-04-28 LAB — GLUCOSE, CAPILLARY
Glucose-Capillary: 68 mg/dL — ABNORMAL LOW (ref 70–99)
Glucose-Capillary: 90 mg/dL (ref 70–99)
Glucose-Capillary: 355 mg/dL — ABNORMAL HIGH (ref 70–99)

## 2020-04-28 MED ORDER — INSULIN GLARGINE 100 UNIT/ML ~~LOC~~ SOLN: 5.0000 [IU] | Freq: Every day | SUBCUTANEOUS | 0 refills | Status: DC

## 2020-04-28 MED ORDER — ASPIRIN 81 MG PO TBEC: 81.0000 mg | DELAYED_RELEASE_TABLET | Freq: Every day | ORAL | 11 refills | Status: DC

## 2020-04-28 NOTE — Discharge Summary (Signed)
Physician Discharge Summary  [name] [name] KGM:[id number] DOB: May 17, 1946 DOA: 04/22/2020  PCP: Libby Maw, MD  Admit date: 04/22/2020 Discharge date: 04/28/2020  Admitted From: Skilled nursing facility Disposition: Skilled nursing facility  Recommendations for Outpatient Follow-up:  1. Follow up with PCP in 1-2 weeks 2. Please obtain BMP/CBC in one week    Discharge Condition: Stable CODE STATUS: DNR Diet recommendation: Low-salt, low-carb diet  Discharge summary:  74 year old gentleman with extensive medical issues currently in skilled nursing home, brittle and uncontrolled insulin-dependent diabetes, stage IV chronic kidney disease, history of CVA with cognitive dysfunction, hypertension, hyperlipidemia and hypothyroidism admitted from nursing home with hyperglycemia.  In February 2021, he had 2 admissions for hypoglycemic episodes 7/3-7/9, admitted with DKA and metabolic encephalopathy and discharged to nursing home.  Brittle diabetes.  He was discharged on very low doses of insulin. Brought back to the ER with worsening blood sugars.  In the emergency room hemodynamically stable.  On room air.  Blood glucose more than 700 with pH .  Started on insulin drip and admitted to the hospital.  Because of his chronic kidney disease and unreliable oral intake, blood sugar control remains a challenge.  Very sensitive to insulin doses especially at night.   Assessment & plan of care:   DKA, uncontrolled insulin-dependent diabetes, brittle diabetes:  With hypoglycemic episodes. Patient discharged to a skilled nursing facility on Lantus 4 units and sliding scale insulin.  Came back with blood sugars more than 700.  Unreliable oral intake and stage IV chronic kidney disease makes very difficult to balance his blood sugars.   His kidney functions has stabilized.  His oral intake has been stable and better.  Noted hypoglycemic episodes with additional doses of short acting  insulin. Plan: Continue long-acting insulin 5 units at night. Keep on low-dose sliding scale insulin.  Will accept hyperglycemia to avoid morning hypoglycemia. Can go up on long-acting insulin if his blood sugars are consistently more than 150.  AKI on CKD stage IV: Due to dehydration.  Treated with IV fluids.  Now at about baseline.  Hypertension: Blood pressures better controlled.  History of CVA with mild cognitive dysfunction: Chronic and stable.    Did not see aspirin on his home medicine list.  Added aspirin 81 mg daily.  On Lipitor.  Thrombocytopenia: Normalized.  Probably due to acute illness.  Hypothyroidism: On Synthroid that he will continue.  Euthyroid.  Chronic debility: Needing additional support.  Currently at nursing home.  Will go back to nursing home. He will continue to benefit with inpatient therapies at a skilled nursing facility.   Medically stable to transfer to skilled level of care.   Discharge Diagnoses:  Principal Problem:   DKA (diabetic ketoacidoses) ([place]) Active Problems:   Anemia of chronic disease   Uncontrolled type 2 diabetes mellitus with diabetic nephropathy, with long-term current use of insulin ([name])   Acute renal failure superimposed on stage 4 chronic kidney disease ([name])   Hyperlipidemia associated with type 2 diabetes mellitus ([place])   Hypertension associated with diabetes ([place])   History of cerebrovascular accident (CVA) in adulthood   Hypothyroidism   Thrombocytopenia ([place])    Discharge Instructions  Discharge Instructions    Diet - low sodium heart healthy   Complete by: As directed    Diet Carb Modified   Complete by: As directed    Increase activity slowly   Complete by: As directed      Allergies as of 04/28/2020   No Known Allergies  Medication List    STOP taking these medications   insulin aspart 100 UNIT/ML injection Commonly known as: novoLOG     TAKE these medications   Accu-Chek Guide w/Device  Kit 1 Device by Does not apply route QID.   Admelog SoloStar 100 UNIT/ML KwikPen Generic drug: insulin lispro Inject 0-9 Units into the skin 4 (four) times daily -  before meals and at bedtime. Sliding Scale  If 0-120= 0 units 121-150= 1 unit 151-200= 2 units 201-250= 3 units 251-300= 5 units 301-350= 7 units 351-400= 9 units Call PCP if CBG 70 or >400   amLODipine 10 MG tablet Commonly known as: NORVASC TAKE 1 TABLET ONCE DAILY.   aspirin 81 MG EC tablet Take 1 tablet (81 mg total) by mouth daily. Swallow whole.   atorvastatin 40 MG tablet Commonly known as: LIPITOR Take 1 tablet (40 mg total) by mouth daily. What changed: when to take this   cyanocobalamin 1000 MCG tablet Take 1 tablet (1,000 mcg total) by mouth daily.   furosemide 20 MG tablet Commonly known as: LASIX Take 1 tablet (20 mg total) by mouth daily.   glucose blood test strip Commonly known as: True Metrix Blood Glucose Test CHECK BLOOD SUGAR UP TO 4 TIMES A DAY. What changed:   how much to take  how to take this  when to take this  additional instructions   glucose blood test strip Commonly known as: Accu-Chek Guide Use as instructed What changed: Another medication with the same name was changed. Make sure you understand how and when to take each.   Accu-Chek Guide test strip Generic drug: glucose blood USE AS DIRECTED What changed: Another medication with the same name was changed. Make sure you understand how and when to take each.   hydrALAZINE 100 MG tablet Commonly known as: APRESOLINE Take 1 tablet (100 mg total) by mouth 3 (three) times daily.   insulin glargine 100 UNIT/ML injection Commonly known as: LANTUS Inject 0.05 mLs (5 Units total) into the skin at bedtime. What changed:   how much to take  Another medication with the same name was removed. Continue taking this medication, and follow the directions you see here.   Insulin Pen Needle 32G X 4 MM Misc 1 Device by  Does not apply route in the morning, at noon, in the evening, and at bedtime.   Insulin Syringe-Needle U-100 31G X 5/16" 0.3 ML Misc Commonly known as: BD Insulin Syringe U/F Inject 1 each as directed 2 (two) times daily. as directed   levothyroxine 25 MCG tablet Commonly known as: SYNTHROID Take 25 mcg by mouth daily before breakfast.   sodium bicarbonate 650 MG tablet Take 1 tablet (650 mg total) by mouth 2 (two) times daily.   Vitamin D (Ergocalciferol) 1.25 MG (50000 UNIT) Caps capsule Commonly known as: DRISDOL Take 50,000 Units by mouth every 7 (seven) days. Thursdays       No Known Allergies  Consultations:  None   Procedures/Studies: DG Tibia/Fibula Right  Result Date: 04/15/2020 CLINICAL DATA:  Recent fall with right lower leg pain. EXAM: RIGHT TIBIA AND FIBULA - 2 VIEW COMPARISON:  None. FINDINGS: Mild diffuse decreased bone mineralization. No evidence of acute fracture or dislocation. IMPRESSION: No acute findings. Electronically Signed   By: Marin Olp M.D.   On: 04/15/2020 11:10   CT Head Wo Contrast  Result Date: 04/14/2020 CLINICAL DATA:  Status post fall.  Initial encounter. EXAM: CT HEAD WITHOUT CONTRAST TECHNIQUE: Contiguous axial images  were obtained from the base of the skull through the vertex without intravenous contrast. COMPARISON:  Head CT scan 03/24/2020. FINDINGS: Brain: No evidence of acute infarction, hemorrhage, hydrocephalus, extra-axial collection or mass lesion/mass effect. Atrophy and chronic microvascular ischemic change again seen. Vascular: No hyperdense vessel or unexpected calcification. Skull: Intact.  No focal lesion. Sinuses/Orbits: Negative. Other: None. IMPRESSION: No acute abnormality. Atrophy and chronic microvascular ischemic change. Electronically Signed   By: Inge Rise M.D.   On: 04/14/2020 17:28   DG Chest Port 1 View  Result Date: 04/22/2020 CLINICAL DATA:  Hyperglycemia, tachypnea EXAM: PORTABLE CHEST 1 VIEW COMPARISON:   04/14/2020 FINDINGS: The heart size and mediastinal contours are within normal limits. Both lungs are clear. The visualized skeletal structures are unremarkable. IMPRESSION: No active disease. Electronically Signed   By: Randa Ngo M.D.   On: 04/22/2020 21:16   DG Chest Port 1 View  Result Date: 04/14/2020 CLINICAL DATA:  Hyperglycemia EXAM: PORTABLE CHEST 1 VIEW COMPARISON:  March 24, 2020 FINDINGS: The mediastinal contour and cardiac silhouette are stable. There is no focal infiltrate, pulmonary edema, or pleural effusion. The visualized bony structures are unremarkable. IMPRESSION: No active disease. Electronically Signed   By: Abelardo Diesel M.D.   On: 04/14/2020 14:31    (Echo, Carotid, EGD, Colonoscopy, ERCP)    Subjective: Patient was seen and examined.  Patient himself denies any complaints.  He is happy to get out of the hospital. Blood sugars 400--- 68 in range over 24 hours. Early morning he was noted to have blood sugar of 68 and was given plenty of oral carbohydrate.   Discharge Exam: Vitals:   04/28/20 0000 04/28/20 0413  BP: (!) 154/75 (!) 172/76  Pulse: 62 64  Resp: 20 18  Temp: 98.3 F (36.8 C) 98.7 F (37.1 C)  SpO2: 96% 98%   Vitals:   04/27/20 1300 04/27/20 2000 04/28/20 0000 04/28/20 0413  BP: (!) 155/94 (!) 160/77 (!) 154/75 (!) 172/76  Pulse: 66 83 62 64  Resp: '19 20 20 18  ' Temp: 98 F (36.7 C) 99 F (37.2 C) 98.3 F (36.8 C) 98.7 F (37.1 C)  TempSrc: Oral  Axillary Oral  SpO2: 96% 97% 96% 98%  Weight:      Height:        General: Pt is alert, awake, not in acute distress, chronically sick looking.  On room air.  Not in distress.  Alert oriented x3.  does have some cognitive dysfunction however he is mostly oriented x3.  Generalized weakness.  No localized weakness. Cardiovascular: RRR, S1/S2 +, no rubs, no gallops Respiratory: CTA bilaterally, no wheezing, no rhonchi Abdominal: Soft, NT, ND, bowel sounds + Extremities: no edema, no  cyanosis    The results of significant diagnostics from this hospitalization (including imaging, microbiology, ancillary and laboratory) are listed below for reference.     Microbiology: Recent Results (from the past 240 hour(s))  SARS Coronavirus 2 by RT PCR (hospital order, performed in [facility] hospital lab) Nasopharyngeal Nasopharyngeal Swab     Status: None   Collection Time: 04/20/20 12:13 PM   Specimen: Nasopharyngeal Swab  Result Value Ref Range Status   SARS Coronavirus 2 NEGATIVE NEGATIVE Final    Comment: (NOTE) SARS-CoV-2 target nucleic acids are NOT DETECTED.  The SARS-CoV-2 RNA is generally detectable in upper and lower respiratory specimens during the acute phase of infection. The lowest concentration of SARS-CoV-2 viral copies this assay can detect is 250 copies / mL. A negative result  does not preclude SARS-CoV-2 infection and should not be used as the sole basis for treatment or other patient management decisions.  A negative result may occur with improper specimen collection / handling, submission of specimen other than nasopharyngeal swab, presence of viral mutation(s) within the areas targeted by this assay, and inadequate number of viral copies (<250 copies / mL). A negative result must be combined with clinical observations, patient history, and epidemiological information.  Fact Sheet for Patients:   StrictlyIdeas.no  Fact Sheet for Healthcare Providers: BankingDealers.co.za  This test is not yet approved or  cleared by the [place] FDA and has been authorized for detection and/or diagnosis of SARS-CoV-2 by FDA under an Emergency Use Authorization (EUA).  This EUA will remain in effect (meaning this test can be used) for the duration of the COVID-19 declaration under Section 564(b)(1) of the Act, 21 U.S.C. section 360bbb-3(b)(1), unless the authorization is terminated or revoked sooner.  Performed  at [facility], [place] [address], [place], [place] [address]   SARS Coronavirus 2 by RT PCR (hospital order, performed in [facility] hospital lab) Nasopharyngeal Nasopharyngeal Swab     Status: None   Collection Time: 04/23/20 12:10 AM   Specimen: Nasopharyngeal Swab  Result Value Ref Range Status   SARS Coronavirus 2 NEGATIVE NEGATIVE Final    Comment: (NOTE) SARS-CoV-2 target nucleic acids are NOT DETECTED.  The SARS-CoV-2 RNA is generally detectable in upper and lower respiratory specimens during the acute phase of infection. The lowest concentration of SARS-CoV-2 viral copies this assay can detect is 250 copies / mL. A negative result does not preclude SARS-CoV-2 infection and should not be used as the sole basis for treatment or other patient management decisions.  A negative result may occur with improper specimen collection / handling, submission of specimen other than nasopharyngeal swab, presence of viral mutation(s) within the areas targeted by this assay, and inadequate number of viral copies (<250 copies / mL). A negative result must be combined with clinical observations, patient history, and epidemiological information.  Fact Sheet for Patients:   StrictlyIdeas.no  Fact Sheet for Healthcare Providers: BankingDealers.co.za  This test is not yet approved or  cleared by the [place] FDA and has been authorized for detection and/or diagnosis of SARS-CoV-2 by FDA under an Emergency Use Authorization (EUA).  This EUA will remain in effect (meaning this test can be used) for the duration of the COVID-19 declaration under Section 564(b)(1) of the Act, 21 U.S.C. section 360bbb-3(b)(1), unless the authorization is terminated or revoked sooner.  Performed at [facility], [place] [address] [place], [place] [address]      Labs: BNP (last 3 results) No results for input(s): BNP in the last 8760  hours. Basic Metabolic Panel: Recent Labs  Lab 04/23/20 0604 04/23/20 1133 04/24/20 0509 04/25/20 0819 04/26/20 0451  NA 132* 136 136 135 137  K 3.5 3.3* 4.0 4.7 4.6  CL 104 108 106 107 107  CO2 17* 17* 18* 18* 19*  GLUCOSE 473* 100* 110* 202* 124*  BUN 63* 61* 57* 59* 61*  CREATININE 4.75* 4.60* 4.31* 3.82* 3.39*  CALCIUM 7.9* 8.0* 7.8* 7.7* 8.1*  MG  --   --  1.9  --   --   PHOS  --   --  4.4  --   --    Liver Function Tests: No results for input(s): AST, ALT, ALKPHOS, BILITOT, PROT, ALBUMIN in the last 168 hours. No results for input(s):  LIPASE, AMYLASE in the last 168 hours. No results for input(s): AMMONIA in the last 168 hours. CBC: Recent Labs  Lab 04/22/20 2335 04/24/20 0509 04/25/20 0819 04/26/20 0451 04/27/20 0415  WBC 7.9 7.4 5.4 5.5 6.6  NEUTROABS  --  3.8 2.5 2.3 2.9  HGB 8.1* 7.9* 7.7* 8.0* 8.3*  HCT 27.4* 23.7* 24.3* 25.7* 25.7*  MCV 98.2 89.8 91.4 91.8 91.5  PLT 94* 217 216 194 195   Cardiac Enzymes: No results for input(s): CKTOTAL, CKMB, CKMBINDEX, TROPONINI in the last 168 hours. BNP: Invalid input(s): POCBNP CBG: Recent Labs  Lab 04/27/20 1712 04/27/20 2034 04/27/20 2353 04/28/20 0354 04/28/20 0412  GLUCAP 237* 227* 145* 68* 90   D-Dimer No results for input(s): DDIMER in the last 72 hours. Hgb A1c No results for input(s): HGBA1C in the last 72 hours. Lipid Profile No results for input(s): CHOL, HDL, LDLCALC, TRIG, CHOLHDL, LDLDIRECT in the last 72 hours. Thyroid function studies No results for input(s): TSH, T4TOTAL, T3FREE, THYROIDAB in the last 72 hours.  Invalid input(s): FREET3 Anemia work up No results for input(s): VITAMINB12, FOLATE, FERRITIN, TIBC, IRON, RETICCTPCT in the last 72 hours. Urinalysis    Component Value Date/Time   COLORURINE YELLOW 04/22/2020 2335   APPEARANCEUR CLEAR 04/22/2020 2335   LABSPEC 1.015 04/22/2020 2335   PHURINE 5.0 04/22/2020 2335   GLUCOSEU >=500 (A) 04/22/2020 2335   GLUCOSEU >=1000 (A)  12/08/2019 1139   HGBUR SMALL (A) 04/22/2020 2335   BILIRUBINUR NEGATIVE 04/22/2020 2335   BILIRUBINUR Negative 11/09/2019 0910   KETONESUR 20 (A) 04/22/2020 2335   PROTEINUR >=300 (A) 04/22/2020 2335   UROBILINOGEN 0.2 12/08/2019 1139   NITRITE NEGATIVE 04/22/2020 2335   LEUKOCYTESUR NEGATIVE 04/22/2020 2335   Sepsis Labs Invalid input(s): PROCALCITONIN,  WBC,  LACTICIDVEN Microbiology Recent Results (from the past 240 hour(s))  SARS Coronavirus 2 by RT PCR (hospital order, performed in [place] hospital lab) Nasopharyngeal Nasopharyngeal Swab     Status: None   Collection Time: 04/20/20 12:13 PM   Specimen: Nasopharyngeal Swab  Result Value Ref Range Status   SARS Coronavirus 2 NEGATIVE NEGATIVE Final    Comment: (NOTE) SARS-CoV-2 target nucleic acids are NOT DETECTED.  The SARS-CoV-2 RNA is generally detectable in upper and lower respiratory specimens during the acute phase of infection. The lowest concentration of SARS-CoV-2 viral copies this assay can detect is 250 copies / mL. A negative result does not preclude SARS-CoV-2 infection and should not be used as the sole basis for treatment or other patient management decisions.  A negative result may occur with improper specimen collection / handling, submission of specimen other than nasopharyngeal swab, presence of viral mutation(s) within the areas targeted by this assay, and inadequate number of viral copies (<250 copies / mL). A negative result must be combined with clinical observations, patient history, and epidemiological information.  Fact Sheet for Patients:   StrictlyIdeas.no  Fact Sheet for Healthcare Providers: BankingDealers.co.za  This test is not yet approved or  cleared by the [place] FDA and has been authorized for detection and/or diagnosis of SARS-CoV-2 by FDA under an Emergency Use Authorization (EUA).  This EUA will remain in effect (meaning  this test can be used) for the duration of the COVID-19 declaration under Section 564(b)(1) of the Act, 21 U.S.C. section 360bbb-3(b)(1), unless the authorization is terminated or revoked sooner.  Performed at [facility], [place] [address] [place], [place] [address]   SARS Coronavirus 2 by RT PCR (hospital  order, performed in [facility] hospital lab) Nasopharyngeal Nasopharyngeal Swab     Status: None   Collection Time: 04/23/20 12:10 AM   Specimen: Nasopharyngeal Swab  Result Value Ref Range Status   SARS Coronavirus 2 NEGATIVE NEGATIVE Final    Comment: (NOTE) SARS-CoV-2 target nucleic acids are NOT DETECTED.  The SARS-CoV-2 RNA is generally detectable in upper and lower respiratory specimens during the acute phase of infection. The lowest concentration of SARS-CoV-2 viral copies this assay can detect is 250 copies / mL. A negative result does not preclude SARS-CoV-2 infection and should not be used as the sole basis for treatment or other patient management decisions.  A negative result may occur with improper specimen collection / handling, submission of specimen other than nasopharyngeal swab, presence of viral mutation(s) within the areas targeted by this assay, and inadequate number of viral copies (<250 copies / mL). A negative result must be combined with clinical observations, patient history, and epidemiological information.  Fact Sheet for Patients:   StrictlyIdeas.no  Fact Sheet for Healthcare Providers: BankingDealers.co.za  This test is not yet approved or  cleared by the [place] FDA and has been authorized for detection and/or diagnosis of SARS-CoV-2 by FDA under an Emergency Use Authorization (EUA).  This EUA will remain in effect (meaning this test can be used) for the duration of the COVID-19 declaration under Section 564(b)(1) of the Act, 21 U.S.C. section 360bbb-3(b)(1), unless the  authorization is terminated or revoked sooner.  Performed at [facility], [place] [address], [place], [place] [address]      Time coordinating discharge:  35 minutes  SIGNED:   Barb Merino, MD  [facility] 04/28/2020, 8:02 AM

## 2020-04-28 NOTE — TOC Transition Note (Signed)
Transition of Care [name]) - CM/SW Discharge Note   Patient Details  Name: [name] [name] MRN: [id number] Date of Birth: [birth year]  [facility] CM/SW Contact:  Emeterio Reeve, [place] Phone Number: 04/28/2020, 11:14 AM   Clinical Narrative:     Pt will discharge to [facility] via ptar. Family has been notified.   Nurse to call report to [phone].  Final next level of care: Skilled Nursing Facility Barriers to Discharge: Barriers Resolved   Patient Goals and CMS Choice Patient states their goals for this hospitalization and ongoing recovery are:: Rehab CMS Medicare.gov Compare Post Acute Care list provided to:: Patient Choice offered to / list presented to : Patient  Discharge Placement              Patient chooses bed at: Other - please specify in the comment section below: ([facility]) Patient to be transferred to facility by: [place] Name of family member notified: [name] Patient and family notified of of transfer: 04/28/20  Discharge Plan and Services     Post Acute Care Choice: [place]                               Social Determinants of Health (SDOH) Interventions     Readmission Risk Interventions Readmission Risk Prevention Plan 04/20/2020  Transportation Screening Complete  Medication Review Press photographer) Complete  SW Recovery Care/Counseling Consult Complete  Palliative Care Screening Complete  Skilled [place] Complete  Some recent data might be hidden    Emeterio Reeve, Latanya Presser, [place] Social Worker [phone]

## 2020-04-28 NOTE — Progress Notes (Signed)
Hypoglycemic Event  CBG: 68 @ 0354          Treatment: 8 oz juice/soda  Symptoms: None  Follow-up CBG: Time:0412 CBG Result: 90  Possible Reasons for Event: Unknown  Comments/MD notified:pt asymptomatic    [name] [name]

## 2020-04-28 NOTE — Discharge Instructions (Signed)
Diabetic Ketoacidosis Diabetic ketoacidosis is a serious complication of diabetes. This condition develops when there is not enough insulin in the body. Insulin is an hormone that regulates blood sugar levels in the body. Normally, insulin allows glucose to enter the cells in the body. The cells break down glucose for energy. Without enough insulin, the body cannot break down glucose, so it breaks down fats instead. This leads to high blood glucose levels in the body and the production of acids that are called ketones. Ketones are poisonous at high levels. If diabetic ketoacidosis is not treated, it can cause severe dehydration and can lead to a coma or death. What are the causes? This condition develops when a lack of insulin causes the body to break down fats instead of glucose. This may be triggered by:  Stress on the body. This stress is brought on by an illness.  Infection.  Medicines that raise blood glucose levels.  Not taking diabetes medicine.  New onset of type 1 diabetes mellitus. What are the signs or symptoms? Symptoms of this condition include:  Fatigue.  Weight loss.  Excessive thirst.  Light-headedness.  Fruity or sweet-smelling breath.  Excessive urination.  Vision changes.  Confusion or irritability.  Nausea.  Vomiting.  Rapid breathing.  Abdominal pain.  Feeling flushed. How is this diagnosed? This condition is diagnosed based on your medical history, a physical exam, and blood tests. You may also have a urine test to check for ketones. How is this treated? This condition may be treated with:  Fluid replacement. This may be done to correct dehydration.  Insulin injections. These may be given through the skin or through an IV tube.  Electrolyte replacement. Electrolytes are minerals in your blood. Electrolytes such as potassium and sodium may be given in pill form or through an IV tube.  Antibiotic medicines. These may be prescribed if your  condition was caused by an infection. Diabetic ketoacidosis is a serious medical condition. You may need emergency treatment in the hospital to monitor your condition. Follow these instructions at home: Eating and drinking  Drink enough fluids to keep your urine clear or pale yellow.  If you are not able to eat, drink clear fluids in small amounts as you are able. Clear fluids include water, ice chips, fruit juice with water added (diluted), and low-calorie sports drinks. You may also have sugar-free jello or popsicles.  If you are able to eat, follow your usual diet and drink sugar-free liquids, such as water. Medicines  Take over-the-counter and prescription medicines only as told by your health care provider.  Continue to take insulin and other diabetes medicines as told by your health care provider.  If you were prescribed an antibiotic, take it as told by your health care provider. Do not stop taking the antibiotic even if you start to feel better. General instructions   Check your urine for ketones when you are ill and as told by your health care provider. ? If your blood glucose is 240 mg/dL (13.3 mmol/L) or higher, check your urine ketones every 4-6 hours.  Check your blood glucose every day, as often as told by your health care provider. ? If your blood glucose is high, drink plenty of fluids. This helps to flush out ketones. ? If your blood glucose is above your target for 2 tests in a row, contact your health care provider.  Carry a medical alert card or wear medical alert jewelry that says that you have diabetes.  Rest  and exercise only as told by your health care provider. Do not exercise when your blood glucose is high and you have ketones in your urine.  If you get sick, call your health care provider and begin treatment quickly. Your body often needs extra insulin to fight an illness. Check your blood glucose every 4-6 hours when you are sick.  Keep all follow-up  visits as told by your health care provider. This is important. Contact a health care provider if:  Your blood glucose level is higher than 240 mg/dL (13.3 mmol/L) for 2 days in a row.  You have moderate or large ketones in your urine.  You have a fever.  You cannot eat or drink without vomiting.  You have been vomiting for more than 2 hours.  You continue to have symptoms of diabetic ketoacidosis.  You develop new symptoms. Get help right away if:  Your blood glucose monitor reads "high" even when you are taking insulin.  You faint.  You have chest pain.  You have trouble breathing.  You have sudden trouble speaking or swallowing.  You have vomiting or diarrhea that gets worse after 3 hours.  You are unable to stay awake.  You have trouble thinking.  You are severely dehydrated. Symptoms of severe dehydration include: ? Extreme thirst. ? Dry mouth. ? Rapid breathing. These symptoms may represent a serious problem that is an emergency. Do not wait to see if the symptoms will go away. Get medical help right away. Call your local emergency services (911 in the [place] Do not drive yourself to the hospital. Summary  Diabetic ketoacidosis is a serious complication of diabetes. This condition develops when there is not enough insulin in the body.  This condition is diagnosed based on your medical history, a physical exam, and blood tests. You may also have a urine test to check for ketones.  Diabetic ketoacidosis is a serious medical condition. You may need emergency treatment in the hospital to monitor your condition.  Contact your health care provider if your blood glucose is higher than 240 mg/dl for 2 days in a row or if you have moderate or large ketones in your urine. This information is not intended to replace advice given to you by your health care provider. Make sure you discuss any questions you have with your health care provider. Document Revised: 11/14/2016  Document Reviewed: 11/03/2016 Elsevier Patient Education  [place].   Hyperglycemia Hyperglycemia occurs when the level of sugar (glucose) in the blood is too high. Glucose is a type of sugar that provides the body's main source of energy. Certain hormones (insulin and glucagon) control the level of glucose in the blood. Insulin lowers blood glucose, and glucagon increases blood glucose. Hyperglycemia can result from having too little insulin in the bloodstream, or from the body not responding normally to insulin. Hyperglycemia occurs most often in people who have diabetes (diabetes mellitus), but it can happen in people who do not have diabetes. It can develop quickly, and it can be life-threatening if it causes you to become severely dehydrated (diabetic ketoacidosis or hyperglycemic hyperosmolar state). Severe hyperglycemia is a medical emergency. What are the causes? If you have diabetes, hyperglycemia may be caused by:  Diabetes medicine.  Medicines that increase blood glucose or affect your diabetes control.  Not eating enough, or not eating often enough.  Changes in physical activity level.  Being sick or having an infection. If you have prediabetes or undiagnosed diabetes:  Hyperglycemia may  be caused by those conditions. If you do not have diabetes, hyperglycemia may be caused by:  Certain medicines, including steroid medicines, beta-blockers, epinephrine, and thiazide diuretics.  Stress.  Serious illness.  Surgery.  Diseases of the pancreas.  Infection. What increases the risk? Hyperglycemia is more likely to develop in people who have risk factors for diabetes, such as:  Having a family member with diabetes.  Having a gene for type 1 diabetes that is passed from parent to child (inherited).  Living in an area with cold weather conditions.  Exposure to certain viruses.  Certain conditions in which the body's disease-fighting (immune) system attacks  itself (autoimmune disorders).  Being overweight or obese.  Having an inactive (sedentary) lifestyle.  Having been diagnosed with insulin resistance.  Having a history of prediabetes, gestational diabetes, or polycystic ovarian syndrome (PCOS).  Being of American-Indian, African-American, Hispanic/Latino, or Asian/Pacific Islander descent. What are the signs or symptoms? Hyperglycemia may not cause any symptoms. If you do have symptoms, they may include early warning signs, such as:  Increased thirst.  Hunger.  Feeling very tired.  Needing to urinate more often than usual.  Blurry vision. Other symptoms may develop if hyperglycemia gets worse, such as:  Dry mouth.  Loss of appetite.  Fruity-smelling breath.  Weakness.  Unexpected or rapid weight gain or weight loss.  Tingling or numbness in the hands or feet.  Headache.  Skin that does not quickly return to normal after being lightly pinched and released (poor skin turgor).  Abdominal pain.  Cuts or bruises that are slow to heal. How is this diagnosed? Hyperglycemia is diagnosed with a blood test to measure your blood glucose level. This blood test is usually done while you are having symptoms. Your health care provider may also do a physical exam and review your medical history. You may have more tests to determine the cause of your hyperglycemia, such as:  A fasting blood glucose (FBG) test. You will not be allowed to eat (you will fast) for at least 8 hours before a blood sample is taken.  An A1c (hemoglobin A1c) blood test. This provides information about blood glucose control over the previous 2-3 months.  An oral glucose tolerance test (OGTT). This measures your blood glucose at two times: ? After fasting. This is your baseline blood glucose level. ? Two hours after drinking a beverage that contains glucose. How is this treated? Treatment depends on the cause of your hyperglycemia. Treatment may  include:  Taking medicine to regulate your blood glucose levels. If you take insulin or other diabetes medicines, your medicine or dosage may be adjusted.  Lifestyle changes, such as exercising more, eating healthier foods, or losing weight.  Treating an illness or infection, if this caused your hyperglycemia.  Checking your blood glucose more often.  Stopping or reducing steroid medicines, if these caused your hyperglycemia. If your hyperglycemia becomes severe and it results in hyperglycemic hyperosmolar state, you must be hospitalized and given IV fluids. Follow these instructions at home:  General instructions  Take over-the-counter and prescription medicines only as told by your health care provider.  Do not use any products that contain nicotine or tobacco, such as cigarettes and e-cigarettes. If you need help quitting, ask your health care provider.  Limit alcohol intake to no more than 1 drink per day for nonpregnant women and 2 drinks per day for men. One drink equals 12 oz of beer, 5 oz of wine, or 1 oz of hard liquor.  Learn to manage stress. If you need help with this, ask your health care provider.  Keep all follow-up visits as told by your health care provider. This is important. Eating and drinking   Maintain a healthy weight.  Exercise regularly, as directed by your health care provider.  Stay hydrated, especially when you exercise, get sick, or spend time in hot temperatures.  Eat healthy foods, such as: ? Lean proteins. ? Complex carbohydrates. ? Fresh fruits and vegetables. ? Low-fat dairy products. ? Healthy fats.  Drink enough fluid to keep your urine clear or pale yellow. If you have diabetes:  Make sure you know the symptoms of hyperglycemia.  Follow your diabetes management plan, as told by your health care provider. Make sure you: ? Take your insulin and medicines as directed. ? Follow your exercise plan. ? Follow your meal plan. Eat on time,  and do not skip meals. ? Check your blood glucose as often as directed. Make sure to check your blood glucose before and after exercise. If you exercise longer or in a different way than usual, check your blood glucose more often. ? Follow your sick day plan whenever you cannot eat or drink normally. Make this plan in advance with your health care provider.  Share your diabetes management plan with people in your workplace, school, and household.  Check your urine for ketones when you are ill and as told by your health care provider.  Carry a medical alert card or wear medical alert jewelry. Contact a health care provider if:  Your blood glucose is at or above 240 mg/dL (13.3 mmol/L) for 2 days in a row.  You have problems keeping your blood glucose in your target range.  You have frequent episodes of hyperglycemia. Get help right away if:  You have difficulty breathing.  You have a change in how you think, feel, or act (mental status).  You have nausea or vomiting that does not go away. These symptoms may represent a serious problem that is an emergency. Do not wait to see if the symptoms will go away. Get medical help right away. Call your local emergency services (911 in the [place] Do not drive yourself to the hospital. Summary  Hyperglycemia occurs when the level of sugar (glucose) in the blood is too high.  Hyperglycemia is diagnosed with a blood test to measure your blood glucose level. This blood test is usually done while you are having symptoms. Your health care provider may also do a physical exam and review your medical history.  If you have diabetes, follow your diabetes management plan as told by your health care provider.  Contact your health care provider if you have problems keeping your blood glucose in your target range. This information is not intended to replace advice given to you by your health care provider. Make sure you discuss any questions you have with  your health care provider. Document Revised: 06/16/2016 Document Reviewed: 06/16/2016 Elsevier Patient Education  [place].

## 2020-04-28 NOTE — Plan of Care (Signed)
  Problem: Education: Goal: Ability to describe self-care measures that may prevent or decrease complications (Diabetes Survival Skills Education) will improve 04/28/2020 1123 by Jeanne Ivan, RN Outcome: Completed/Met 04/28/2020 1043 by Jeanne Ivan, RN Outcome: Progressing Goal: Individualized Educational Video(s) 04/28/2020 1123 by Jeanne Ivan, RN Outcome: Completed/Met 04/28/2020 1043 by Jeanne Ivan, RN Outcome: Progressing   Problem: Cardiac: Goal: Ability to maintain an adequate cardiac output will improve 04/28/2020 1123 by Jeanne Ivan, RN Outcome: Completed/Met 04/28/2020 1043 by Jeanne Ivan, RN Outcome: Progressing   Problem: Health Behavior/Discharge Planning: Goal: Ability to identify and utilize available resources and services will improve 04/28/2020 1123 by Jeanne Ivan, RN Outcome: Completed/Met 04/28/2020 1043 by Jeanne Ivan, RN Outcome: Progressing Goal: Ability to manage health-related needs will improve 04/28/2020 1123 by Jeanne Ivan, RN Outcome: Completed/Met 04/28/2020 1043 by Jeanne Ivan, RN Outcome: Progressing   Problem: Fluid Volume: Goal: Ability to achieve a balanced intake and output will improve 04/28/2020 1123 by Jeanne Ivan, RN Outcome: Completed/Met 04/28/2020 1043 by Jeanne Ivan, RN Outcome: Progressing   Problem: Metabolic: Goal: Ability to maintain appropriate glucose levels will improve 04/28/2020 1123 by Jeanne Ivan, RN Outcome: Completed/Met 04/28/2020 1043 by Jeanne Ivan, RN Outcome: Progressing   Problem: Nutritional: Goal: Maintenance of adequate nutrition will improve 04/28/2020 1123 by Jeanne Ivan, RN Outcome: Completed/Met 04/28/2020 1043 by Jeanne Ivan, RN Outcome: Progressing Goal: Maintenance of adequate weight for body size and type will improve 04/28/2020 1123 by Jeanne Ivan, RN Outcome: Completed/Met 04/28/2020 1043 by Jeanne Ivan, RN Outcome: Progressing    Problem: Respiratory: Goal: Will regain and/or maintain adequate ventilation 04/28/2020 1123 by Jeanne Ivan, RN Outcome: Completed/Met 04/28/2020 1043 by Jeanne Ivan, RN Outcome: Progressing   Problem: Urinary Elimination: Goal: Ability to achieve and maintain adequate renal perfusion and functioning will improve 04/28/2020 1123 by Jeanne Ivan, RN Outcome: Completed/Met 04/28/2020 1043 by Jeanne Ivan, RN Outcome: Progressing

## 2020-04-28 NOTE — Plan of Care (Signed)

## 2020-05-01 ENCOUNTER — Encounter (HOSPITAL_COMMUNITY): Payer: Self-pay

## 2020-05-01 ENCOUNTER — Emergency Department (HOSPITAL_COMMUNITY)
Admission: EM | Admit: 2020-05-01 | Discharge: 2020-05-02 | Disposition: A | Discharge status: Home / Self Care | Payer: Medicare Other | Attending: Emergency Medicine | Admitting: Emergency Medicine

## 2020-05-01 ENCOUNTER — Inpatient Hospital Stay: Payer: Medicare Other | Admitting: Family Medicine

## 2020-05-01 DIAGNOSIS — E1165 Type 2 diabetes mellitus with hyperglycemia: Secondary | ICD-10-CM | POA: Diagnosis not present

## 2020-05-01 DIAGNOSIS — E109 Type 1 diabetes mellitus without complications: Secondary | ICD-10-CM | POA: Diagnosis present

## 2020-05-01 DIAGNOSIS — E1121 Type 2 diabetes mellitus with diabetic nephropathy: Secondary | ICD-10-CM

## 2020-05-01 DIAGNOSIS — Z7982 Long term (current) use of aspirin: Secondary | ICD-10-CM | POA: Diagnosis not present

## 2020-05-01 DIAGNOSIS — E039 Hypothyroidism, unspecified: Secondary | ICD-10-CM | POA: Diagnosis not present

## 2020-05-01 DIAGNOSIS — R739 Hyperglycemia, unspecified: Secondary | ICD-10-CM | POA: Diagnosis present

## 2020-05-01 DIAGNOSIS — I5032 Chronic diastolic (congestive) heart failure: Secondary | ICD-10-CM | POA: Diagnosis not present

## 2020-05-01 DIAGNOSIS — Z87891 Personal history of nicotine dependence: Secondary | ICD-10-CM | POA: Insufficient documentation

## 2020-05-01 DIAGNOSIS — Z20822 Contact with and (suspected) exposure to covid-19: Secondary | ICD-10-CM | POA: Insufficient documentation

## 2020-05-01 DIAGNOSIS — N185 Chronic kidney disease, stage 5: Secondary | ICD-10-CM

## 2020-05-01 DIAGNOSIS — Z79899 Other long term (current) drug therapy: Secondary | ICD-10-CM | POA: Diagnosis not present

## 2020-05-01 DIAGNOSIS — I132 Hypertensive heart and chronic kidney disease with heart failure and with stage 5 chronic kidney disease, or end stage renal disease: Secondary | ICD-10-CM | POA: Insufficient documentation

## 2020-05-01 DIAGNOSIS — Z794 Long term (current) use of insulin: Secondary | ICD-10-CM | POA: Diagnosis not present

## 2020-05-01 DIAGNOSIS — N184 Chronic kidney disease, stage 4 (severe): Secondary | ICD-10-CM

## 2020-05-01 DIAGNOSIS — IMO0002 Reserved for concepts with insufficient information to code with codable children: Secondary | ICD-10-CM

## 2020-05-01 LAB — I-STAT CHEM 8, ED
BUN: 70 mg/dL — ABNORMAL HIGH (ref 8–23)
Calcium, Ion: 1.08 mmol/L — ABNORMAL LOW (ref 1.15–1.40)
Chloride: 100 mmol/L (ref 98–111)
Creatinine, Ser: 4.4 mg/dL — ABNORMAL HIGH (ref 0.61–1.24)
Glucose, Bld: 466 mg/dL — ABNORMAL HIGH (ref 70–99)
HCT: 29 % — ABNORMAL LOW (ref 39.0–52.0)
Hemoglobin: 9.9 g/dL — ABNORMAL LOW (ref 13.0–17.0)
Potassium: 4.6 mmol/L (ref 3.5–5.1)
Sodium: 134 mmol/L — ABNORMAL LOW (ref 135–145)
TCO2: 19 mmol/L — ABNORMAL LOW (ref 22–32)

## 2020-05-01 LAB — I-STAT VENOUS BLOOD GAS, ED
Acid-base deficit: 5 mmol/L — ABNORMAL HIGH (ref 0.0–2.0)
Bicarbonate: 20 mmol/L (ref 20.0–28.0)
Calcium, Ion: 1.06 mmol/L — ABNORMAL LOW (ref 1.15–1.40)
HCT: 28 % — ABNORMAL LOW (ref 39.0–52.0)
Hemoglobin: 9.5 g/dL — ABNORMAL LOW (ref 13.0–17.0)
O2 Saturation: 82 %
Potassium: 4.6 mmol/L (ref 3.5–5.1)
Sodium: 134 mmol/L — ABNORMAL LOW (ref 135–145)
TCO2: 21 mmol/L — ABNORMAL LOW (ref 22–32)
pCO2, Ven: 34.3 mmHg — ABNORMAL LOW (ref 44.0–60.0)
pH, Ven: 7.373 (ref 7.250–7.430)
pO2, Ven: 47 mmHg — ABNORMAL HIGH (ref 32.0–45.0)

## 2020-05-01 LAB — CBC WITH DIFFERENTIAL/PLATELET
Abs Immature Granulocytes: 0.02 10*3/uL (ref 0.00–0.07)
Basophils Absolute: 0.1 10*3/uL (ref 0.0–0.1)
Basophils Relative: 1 %
Eosinophils Absolute: 0.3 10*3/uL (ref 0.0–0.5)
Eosinophils Relative: 4 %
HCT: 28.8 % — ABNORMAL LOW (ref 39.0–52.0)
Hemoglobin: 8.8 g/dL — ABNORMAL LOW (ref 13.0–17.0)
Immature Granulocytes: 0 %
Lymphocytes Relative: 19 %
Lymphs Abs: 1.3 10*3/uL (ref 0.7–4.0)
MCH: 28.6 pg (ref 26.0–34.0)
MCHC: 30.6 g/dL (ref 30.0–36.0)
MCV: 93.5 fL (ref 80.0–100.0)
Monocytes Absolute: 0.7 10*3/uL (ref 0.1–1.0)
Monocytes Relative: 9 %
Neutro Abs: 4.8 10*3/uL (ref 1.7–7.7)
Neutrophils Relative %: 67 %
Platelets: 257 10*3/uL (ref 150–400)
RBC: 3.08 MIL/uL — ABNORMAL LOW (ref 4.22–5.81)
RDW: 13.9 % (ref 11.5–15.5)
WBC: 7.2 10*3/uL (ref 4.0–10.5)
nRBC: 0 % (ref 0.0–0.2)

## 2020-05-01 LAB — SARS CORONAVIRUS 2 BY RT PCR (HOSPITAL ORDER, PERFORMED IN ~~LOC~~ HOSPITAL LAB): SARS Coronavirus 2: NEGATIVE

## 2020-05-01 LAB — BETA-HYDROXYBUTYRIC ACID: Beta-Hydroxybutyric Acid: 0.2 mmol/L (ref 0.05–0.27)

## 2020-05-01 LAB — COMPREHENSIVE METABOLIC PANEL
ALT: 16 U/L (ref 0–44)
AST: 18 U/L (ref 15–41)
Albumin: 3 g/dL — ABNORMAL LOW (ref 3.5–5.0)
Alkaline Phosphatase: 108 U/L (ref 38–126)
Anion gap: 14 (ref 5–15)
BUN: 70 mg/dL — ABNORMAL HIGH (ref 8–23)
CO2: 18 mmol/L — ABNORMAL LOW (ref 22–32)
Calcium: 8.2 mg/dL — ABNORMAL LOW (ref 8.9–10.3)
Chloride: 100 mmol/L (ref 98–111)
Creatinine, Ser: 4.15 mg/dL — ABNORMAL HIGH (ref 0.61–1.24)
GFR calc Af Amer: 15 mL/min — ABNORMAL LOW (ref >60)
GFR calc non Af Amer: 13 mL/min — ABNORMAL LOW (ref >60)
Glucose, Bld: 470 mg/dL — ABNORMAL HIGH (ref 70–99)
Potassium: 4.5 mmol/L (ref 3.5–5.1)
Sodium: 132 mmol/L — ABNORMAL LOW (ref 135–145)
Total Bilirubin: 0.4 mg/dL (ref 0.3–1.2)
Total Protein: 6.4 g/dL — ABNORMAL LOW (ref 6.5–8.1)

## 2020-05-01 LAB — CBG MONITORING, ED
Glucose-Capillary: 482 mg/dL — ABNORMAL HIGH (ref 70–99)
Glucose-Capillary: 248 mg/dL — ABNORMAL HIGH (ref 70–99)

## 2020-05-01 MED ORDER — INSULIN ASPART 100 UNIT/ML ~~LOC~~ SOLN
8.0000 [IU] | Freq: Once | SUBCUTANEOUS | Status: AC
  Administered 2020-05-01: 8 [IU] via SUBCUTANEOUS

## 2020-05-01 MED ORDER — SODIUM CHLORIDE 0.9 % IV BOLUS
1000.0000 mL | Freq: Once | INTRAVENOUS | Status: AC
  Administered 2020-05-01: 1000 mL via INTRAVENOUS

## 2020-05-01 NOTE — Discharge Instructions (Signed)
You were evaluated in the emergency department for elevated blood sugars. You had blood work done that did not show you were in DKA. You currently do not need to be admitted to the hospital but you will need to have your blood sugars closely monitored. We also discussed your case with Dr. Hollie Salk nephrology from [facility] and she is going to have the clinic call you for close follow-up. Your kidney function needs to be better managed by nephrologist. Return to the emergency department if any worsening or concerning symptoms.

## 2020-05-01 NOTE — ED Notes (Signed)
[facility] called by Pollie Friar.  Pt is #3 on list

## 2020-05-01 NOTE — ED Triage Notes (Addendum)
Pt bib [facility] from [place] home w/ c/o hyperglycemia. CBG > 650 w/ EMS. Pt AOx4 although has hx of dementia per EMS. Pt hx CKD, not on dialysis per EMS. Pt denies hyperglycemia symptoms.   EMS VS: BP 128/70 HR 80 RR 18 SPO2 99% RA

## 2020-05-01 NOTE — ED Notes (Signed)
Pt states SNF is not giving him his insulin.

## 2020-05-01 NOTE — Consult Note (Signed)
History and Physical    [name] [name] XBL:[id number] DOB: March 17, 1946 DOA: 05/01/2020  PCP: Libby Maw, MD  Patient coming from: SNF  I have personally briefly reviewed patient's old medical records in [place]  Chief Complaint: Hyperglycemia  HPI: [name] [name] is a 74 y.o. male with medical history significant of CKD 4, brittle DM, HTN, stroke with mild cognitive impairment and debility.  Pt with 2 admissions in Feb for hypoglycemia.  Pt with Admission for DKA in June and 2 thus far in July for DKA.  Pt presents to the ED from SNF due to CBGs running > 650 at SNF.  Pt reports that SNF typically under doses his insulin in order to try and avoid low BGLs.  Review of prior discharge summaries (ie DC summary on 7/17 supports this).  Pt feels at baseline and has no complaints.  Tells me that he feels fine to go back to SNF.   ED Course: BGL 460 in ED, given 8u novolog and BGL down to 248.  Creat 4.4 with BUN 70: this is about baseline for pt whos GFR runs right around 15.  Seems like he hasnt been in to see nephrology yet.  Hospitalist asked to consult.   Review of Systems: As per HPI, otherwise all review of systems negative.  Past Medical History:  Diagnosis Date  . 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 Closed comminuted intertrochanteric fracture of left femur ([place])   . Diabetes mellitus without complication ([place])   . Diabetic hyperosmolar non-ketotic state ([place]) 05/15/2016  . DKA (diabetic ketoacidoses) ([place]) 05/09/2016  . Hypertension   . Hypoglycemia 11/29/2019  . Postoperative anemia due to acute blood loss 06/18/2016  . Retroperitoneal hematoma 06/18/2016  . Stroke ([place])   . Vitamin B 12 deficiency 06/18/2016    Past Surgical History:  Procedure Laterality Date  . HIP ARTHROPLASTY Right 02/05/2013   Procedure: ARTHROPLASTY BIPOLAR HIP;  Surgeon: [name], MD;  Location: [facility];  Service:  Orthopedics;  Laterality: Right;  . INTRAMEDULLARY (IM) NAIL INTERTROCHANTERIC Left 06/16/2016   Procedure: INTRAMEDULLARY (IM) NAIL INTERTROCHANTRIC;  Surgeon: Rod Can, MD;  Location: [facility];  Service: Orthopedics;  Laterality: Left;     reports that he quit smoking about 4 years ago. His smoking use included cigarettes. He has a 7.50 pack-year smoking history. He has never used smokeless tobacco. He reports that he does not drink alcohol and does not use drugs.  No Known Allergies  Family History  Problem Relation Age of Onset  . Alzheimer's disease Mother   . Hypertension Mother   . Hyperlipidemia Father   . Hypertension Father   . Healthy Maternal Grandmother   . Pneumonia Maternal Grandfather      Prior to Admission medications   Medication Sig Start Date End Date Taking? Authorizing Provider  ACCU-CHEK GUIDE test strip USE AS DIRECTED 12/19/19   Azzie Glatter, [name]  amLODipine (NORVASC) 10 MG tablet TAKE 1 TABLET ONCE DAILY. Patient taking differently: Take 10 mg by mouth daily.  09/28/19   Azzie Glatter, [name]  aspirin EC 81 MG EC tablet Take 1 tablet (81 mg total) by mouth daily. Swallow whole. 04/28/20   Barb Merino, MD  atorvastatin (LIPITOR) 40 MG tablet Take 1 tablet (40 mg total) by mouth daily. Patient taking differently: Take 40 mg by mouth every evening.  03/29/20   Lurline Del, DO  Blood Glucose Monitoring Suppl (ACCU-CHEK GUIDE) w/Device  KIT 1 Device by Does not apply route QID. Patient not taking: Reported on 01/03/2020 11/02/19   Azzie Glatter, [name]  furosemide (LASIX) 20 MG tablet Take 1 tablet (20 mg total) by mouth daily. 12/05/19   [name], MD  glucose blood (ACCU-CHEK GUIDE) test strip Use as instructed 11/03/18   Azzie Glatter, [name]  glucose blood (TRUE METRIX BLOOD GLUCOSE TEST) test strip CHECK BLOOD SUGAR UP TO 4 TIMES A DAY. Patient taking differently: 1 each by Other route See admin instructions. CHECK BLOOD SUGAR UP TO 2 TIMES A DAY.  05/25/18   Azzie Glatter, [name]  hydrALAZINE (APRESOLINE) 100 MG tablet Take 1 tablet (100 mg total) by mouth 3 (three) times daily. 04/19/20   [name], MD  insulin glargine (LANTUS) 100 UNIT/ML injection Inject 0.05 mLs (5 Units total) into the skin at bedtime. 04/28/20   Barb Merino, MD  insulin lispro (ADMELOG SOLOSTAR) 100 UNIT/ML KwikPen Inject 0-9 Units into the skin 4 (four) times daily -  before meals and at bedtime. Sliding Scale  If 0-120= 0 units 121-150= 1 unit 151-200= 2 units 201-250= 3 units 251-300= 5 units 301-350= 7 units 351-400= 9 units Call PCP if CBG 70 or >400    [provider]  Insulin Pen Needle 32G X 4 MM MISC 1 Device by Does not apply route in the morning, at noon, in the evening, and at bedtime. 01/03/20   [name], [name], MD  Insulin Syringe-Needle U-100 (BD INSULIN SYRINGE U/F) 31G X 5/16" 0.3 ML MISC Inject 1 each as directed 2 (two) times daily. as directed 11/02/19   Azzie Glatter, [name]  levothyroxine (SYNTHROID) 25 MCG tablet Take 25 mcg by mouth daily before breakfast.  04/13/20   [provider]  sodium bicarbonate 650 MG tablet Take 1 tablet (650 mg total) by mouth 2 (two) times daily. 04/19/20   [name], MD  vitamin B-12 1000 MCG tablet Take 1 tablet (1,000 mcg total) by mouth daily. 12/05/19   [name], MD  Vitamin D, Ergocalciferol, (DRISDOL) 1.25 MG (50000 UNIT) CAPS capsule Take 50,000 Units by mouth every 7 (seven) days. Thursdays    [provider]    Physical Exam: Vitals:   05/01/20 1900 05/01/20 1949 05/01/20 2016 05/01/20 2100  BP: 140/65 136/76 131/68 (!) 157/75  Pulse: 64 91 75 73  Resp: 17 19 (!) 21 (!) 22  Temp: 98.7 F (37.1 C)     TempSrc: Oral     SpO2: 97% 100% 99% 100%    Constitutional: NAD, calm, comfortable Eyes: PERRL, lids and conjunctivae normal ENMT: Mucous membranes are moist. Posterior pharynx clear of any exudate or lesions.Normal dentition.  Neck: normal,  supple, no masses, no thyromegaly Respiratory: clear to auscultation bilaterally, no wheezing, no crackles. Normal respiratory effort. No accessory muscle use.  Cardiovascular: Regular rate and rhythm, no murmurs / rubs / gallops. No extremity edema. 2+ pedal pulses. No carotid bruits.  Abdomen: no tenderness, no masses palpated. No hepatosplenomegaly. Bowel sounds positive.  Musculoskeletal: no clubbing / cyanosis. No joint deformity upper and lower extremities. Good ROM, no contractures. Normal muscle tone.  Skin: no rashes, lesions, ulcers. No induration Neurologic: CN 2-12 grossly intact. Sensation intact, DTR normal. Strength 5/5 in all 4. Slurred speech Psychiatric: Normal judgment and insight. Alert and oriented x 3. Normal mood.    Labs on Admission: I have personally reviewed following labs and imaging studies  CBC: Recent Labs  Lab 04/25/20  [id number] 04/25/20 0819 04/26/20 0451 04/27/20 0415 04/28/20 0748 05/01/20 1954 05/01/20 2013  WBC 5.4  --  5.5 6.6 6.3 7.2  --   NEUTROABS 2.5  --  2.3 2.9 3.5 4.8  --   HGB 7.7*   < > 8.0* 8.3* 8.0* 8.8* 9.5*  9.9*  HCT 24.3*   < > 25.7* 25.7* 24.6* 28.8* 28.0*  29.0*  MCV 91.4  --  91.8 91.5 92.5 93.5  --   PLT 216  --  194 195 191 257  --    < > = values in this interval not displayed.   Basic Metabolic Panel: Recent Labs  Lab 04/25/20 0819 04/26/20 0451 05/01/20 1954 05/01/20 2013  NA 135 137 132* 134*  134*  K 4.7 4.6 4.5 4.6  4.6  CL 107 107 100 100  CO2 18* 19* 18*  --   GLUCOSE 202* 124* 470* 466*  BUN 59* 61* 70* 70*  CREATININE 3.82* 3.39* 4.15* 4.40*  CALCIUM 7.7* 8.1* 8.2*  --    GFR: Estimated Creatinine Clearance: 13.6 mL/min (A) (by C-G formula based on SCr of 4.4 mg/dL (H)). Liver Function Tests: Recent Labs  Lab 05/01/20 1954  AST 18  ALT 16  ALKPHOS 108  BILITOT 0.4  PROT 6.4*  ALBUMIN 3.0*   No results for input(s): LIPASE, AMYLASE in the last 168 hours. No results for input(s): AMMONIA in  the last 168 hours. Coagulation Profile: No results for input(s): INR, PROTIME in the last 168 hours. Cardiac Enzymes: No results for input(s): CKTOTAL, CKMB, CKMBINDEX, TROPONINI in the last 168 hours. BNP (last 3 results) No results for input(s): PROBNP in the last 8760 hours. HbA1C: No results for input(s): HGBA1C in the last 72 hours. CBG: Recent Labs  Lab 04/28/20 0354 04/28/20 0412 04/28/20 0920 05/01/20 1856 05/01/20 2144  GLUCAP 68* 90 355* 482* 248*   Lipid Profile: No results for input(s): CHOL, HDL, LDLCALC, TRIG, CHOLHDL, LDLDIRECT in the last 72 hours. Thyroid Function Tests: No results for input(s): TSH, T4TOTAL, FREET4, T3FREE, THYROIDAB in the last 72 hours. Anemia Panel: No results for input(s): VITAMINB12, FOLATE, FERRITIN, TIBC, IRON, RETICCTPCT in the last 72 hours. Urine analysis:    Component Value Date/Time   COLORURINE YELLOW 04/22/2020 2335   APPEARANCEUR CLEAR 04/22/2020 2335   LABSPEC 1.015 04/22/2020 2335   PHURINE 5.0 04/22/2020 2335   GLUCOSEU >=500 (A) 04/22/2020 2335   GLUCOSEU >=1000 (A) 12/08/2019 1139   HGBUR SMALL (A) 04/22/2020 2335   BILIRUBINUR NEGATIVE 04/22/2020 2335   BILIRUBINUR Negative 11/09/2019 0910   KETONESUR 20 (A) 04/22/2020 2335   PROTEINUR >=300 (A) 04/22/2020 2335   UROBILINOGEN 0.2 12/08/2019 1139   NITRITE NEGATIVE 04/22/2020 2335   LEUKOCYTESUR NEGATIVE 04/22/2020 2335    Radiological Exams on Admission: No results found.  EKG: Independently reviewed.  Assessment/Plan Principal Problem:   Uncontrolled type 2 diabetes mellitus with diabetic nephropathy, with long-term current use of insulin ([name]) Active Problems:   Brittle diabetes mellitus ([name])   CKD (chronic kidney disease) stage 4, GFR 15-29 ml/min ([name])    1. Uncontrolled DM2 - 1. BGL now 248, no anion gap, pH nl and pt feeling at baseline currently 2. Doesn't require admission to hospital at present time. 3. Unreliable oral intake and stage IV  chronic kidney disease makes very difficult to balance his blood sugars. 4. As previously suggested: 1. Continue long-acting insulin 5 units at night. 2. Keep on low-dose sliding scale insulin.  Will accept hyperglycemia  to avoid morning hypoglycemia. 3. Can go up on long-acting insulin if his blood sugars are consistently more than 150. 2. CKD 4-5 1. Chronic with GFR right around 15 at baseline 2. Needs to be seeing a nephrologist but hasnt been! 3. EDP spoke with Dr. Hollie Salk: 1. Nephrology tried to reach out to pt multiple times in 2018 but pt never seen in office yet 2. Now pt in SNF so she will get her office to reach out to Pt's SNF to make an appointment for pt to be seen in the near future. 3. No emergent dialysis needs or indication to admit pt at this time.    [name] [name] Jerilynn Mages DO [facility]  How to contact the [facility] Attending or Consulting provider [place] or covering provider during after hours [place], for this patient?  1. Check the care team in [facility] and look for a) attending/consulting [facility] provider listed and b) the [facility] team listed 2. Log into [email or web address]  Amion Physician Scheduling and messaging for groups and whole hospitals  On call and physician scheduling software for group practices, residents, hospitalists and other medical providers for call, clinic, rotation and shift schedules. On[employer] is a hospital-wide system for scheduling doctors and paging doctors on call. EasyPlot is for scientific plotting and data analysis.  [email or web address]  and use [place]'s universal password to access. If you do not have the password, please contact the hospital operator.  3. Locate the [facility] provider you are looking for under [facility] and page to a number that you can be directly reached. 4. If you still have difficulty reaching the provider, please page the [facility] (Director on Call) for the Hospitalists listed on amion for assistance.  05/01/2020, 10:08 PM

## 2020-05-01 NOTE — ED Provider Notes (Signed)
[facility] EMERGENCY DEPARTMENT Provider Note   CSN: [id number] Arrival date & time: 05/01/20  1853     History Chief Complaint  Patient presents with  . Hyperglycemia    [name] [name] is a 74 y.o. male.  He has a history of diabetes and has been admitted twice this month for DKA.  He is here from his facility for evaluation of elevated blood sugar.  Blood sugar reportedly greater than 650 by EMS.  Patient himself denies any complaints.  No chest pain or shortness of breath no nausea vomiting diarrhea no urinary symptoms.  The history is provided by the patient and the EMS personnel.  Hyperglycemia Blood sugar level PTA:  650 Severity:  Severe Onset quality:  Unable to specify Progression:  Unchanged Chronicity:  Recurrent Diabetes status:  Controlled with insulin Relieved by:  None tried Ineffective treatments:  None tried Associated symptoms: no abdominal pain, no chest pain, no dysuria, no fever, no nausea, no shortness of breath and no vomiting   Risk factors: hx of DKA        Past Medical History:  Diagnosis Date  . 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 Closed comminuted intertrochanteric fracture of left femur ([place])   . Diabetes mellitus without complication ([place])   . Diabetic hyperosmolar non-ketotic state ([place]) 05/15/2016  . DKA (diabetic ketoacidoses) ([place]) 05/09/2016  . Hypertension   . Hypoglycemia 11/29/2019  . Postoperative anemia due to acute blood loss 06/18/2016  . Retroperitoneal hematoma 06/18/2016  . Stroke ([place])   . Vitamin B 12 deficiency 06/18/2016    Patient Active Problem List   Diagnosis Date Noted  . Thrombocytopenia ([place]) 04/23/2020  . Hypothyroidism 04/22/2020  . DKA (diabetic ketoacidoses) ([place]) 04/14/2020  . PVD (peripheral vascular disease) ([place]) 02/02/2020  . Poor social situation 12/08/2019  . CKD (chronic kidney disease) stage 4, GFR 15-29 ml/min ([name]) 12/08/2019  .  Cognitive decline 11/22/2019  . ETOH abuse 05/17/2019  . Chronic diastolic CHF (congestive heart failure) ([place]) 05/17/2019  . Uncontrolled type 2 diabetes mellitus with hyperglycemia, with long-term current use of insulin ([place]) 10/03/2018  . Underweight 11/18/2017  . Anemia 11/18/2017  . History of cerebrovascular accident (CVA) in adulthood 07/25/2017  . Depression with anxiety 07/25/2017  . Left ventricular diastolic dysfunction 07/86/7544  . Dementia ([place]) 07/25/2017  . Brittle diabetes mellitus ([place]) 06/28/2017  . Hyperlipidemia associated with type 2 diabetes mellitus ([place]) 06/27/2017  . Hypertension associated with diabetes ([place]) 06/27/2017  . Weakness 11/16/2016  . Acute renal failure superimposed on stage 4 chronic kidney disease ([place])   . Noncompliance with medication regimen 08/17/2016  . History of DVT (deep vein thrombosis) 07/06/2016  . Vitamin B 12 deficiency 06/18/2016  . Hyponatremia 05/15/2016  . Anxiety 05/15/2016  . Acute on chronic renal failure ([place]) 05/09/2016  . Narcotic dependency, continuous ([place]) 11/02/2015  . Uncontrolled type 2 diabetes mellitus with diabetic nephropathy, with long-term current use of insulin ([place]) 08/12/2015  . Anemia of chronic disease 02/02/2013    Past Surgical History:  Procedure Laterality Date  . HIP ARTHROPLASTY Right 02/05/2013   Procedure: ARTHROPLASTY BIPOLAR HIP;  Surgeon: [name], MD;  Location: [facility];  Service: Orthopedics;  Laterality: Right;  . INTRAMEDULLARY (IM) NAIL INTERTROCHANTERIC Left 06/16/2016   Procedure: INTRAMEDULLARY (IM) NAIL INTERTROCHANTRIC;  Surgeon: Rod Can, MD;  Location: [facility];  Service: Orthopedics;  Laterality: Left;       Family History  Problem  Relation Age of Onset  . Alzheimer's disease Mother   . Hypertension Mother   . Hyperlipidemia Father   . Hypertension Father   . Healthy Maternal Grandmother   . Pneumonia Maternal Grandfather     Social History   Tobacco Use  .  Smoking status: Former Smoker    Packs/day: 0.25    Years: 30.00    Pack years: 7.50    Types: Cigarettes    Quit date: 02/01/2016    Years since quitting: 4.2  . Smokeless tobacco: Never Used  Vaping Use  . Vaping Use: Never used  Substance Use Topics  . Alcohol use: No  . Drug use: No    Home Medications Prior to Admission medications   Medication Sig Start Date End Date Taking? Authorizing Provider  ACCU-CHEK GUIDE test strip USE AS DIRECTED 12/19/19   Azzie Glatter, [name]  amLODipine (NORVASC) 10 MG tablet TAKE 1 TABLET ONCE DAILY. Patient taking differently: Take 10 mg by mouth daily.  09/28/19   Azzie Glatter, [name]  aspirin EC 81 MG EC tablet Take 1 tablet (81 mg total) by mouth daily. Swallow whole. 04/28/20   Barb Merino, MD  atorvastatin (LIPITOR) 40 MG tablet Take 1 tablet (40 mg total) by mouth daily. Patient taking differently: Take 40 mg by mouth every evening.  03/29/20   Lurline Del, DO  Blood Glucose Monitoring Suppl (ACCU-CHEK GUIDE) w/Device KIT 1 Device by Does not apply route QID. Patient not taking: Reported on 01/03/2020 11/02/19   Azzie Glatter, [name]  furosemide (LASIX) 20 MG tablet Take 1 tablet (20 mg total) by mouth daily. 12/05/19   [name], MD  glucose blood (ACCU-CHEK GUIDE) test strip Use as instructed 11/03/18   Azzie Glatter, [name]  glucose blood (TRUE METRIX BLOOD GLUCOSE TEST) test strip CHECK BLOOD SUGAR UP TO 4 TIMES A DAY. Patient taking differently: 1 each by Other route See admin instructions. CHECK BLOOD SUGAR UP TO 2 TIMES A DAY. 05/25/18   Azzie Glatter, [name]  hydrALAZINE (APRESOLINE) 100 MG tablet Take 1 tablet (100 mg total) by mouth 3 (three) times daily. 04/19/20   [name], MD  insulin glargine (LANTUS) 100 UNIT/ML injection Inject 0.05 mLs (5 Units total) into the skin at bedtime. 04/28/20   Barb Merino, MD  insulin lispro (ADMELOG SOLOSTAR) 100 UNIT/ML KwikPen Inject 0-9 Units into the skin 4 (four) times daily -   before meals and at bedtime. Sliding Scale  If 0-120= 0 units 121-150= 1 unit 151-200= 2 units 201-250= 3 units 251-300= 5 units 301-350= 7 units 351-400= 9 units Call PCP if CBG 70 or >400    [provider]  Insulin Pen Needle 32G X 4 MM MISC 1 Device by Does not apply route in the morning, at noon, in the evening, and at bedtime. 01/03/20   [name], [name], MD  Insulin Syringe-Needle U-100 (BD INSULIN SYRINGE U/F) 31G X 5/16" 0.3 ML MISC Inject 1 each as directed 2 (two) times daily. as directed 11/02/19   Azzie Glatter, [name]  levothyroxine (SYNTHROID) 25 MCG tablet Take 25 mcg by mouth daily before breakfast.  04/13/20   [provider]  sodium bicarbonate 650 MG tablet Take 1 tablet (650 mg total) by mouth 2 (two) times daily. 04/19/20   [name], MD  vitamin B-12 1000 MCG tablet Take 1 tablet (1,000 mcg total) by mouth daily. 12/05/19   [name], MD  Vitamin D, Ergocalciferol, (DRISDOL)  1.25 MG (50000 UNIT) CAPS capsule Take 50,000 Units by mouth every 7 (seven) days. Thursdays    [provider]    Allergies    Patient has no known allergies.  Review of Systems   Review of Systems  Constitutional: Negative for fever.  HENT: Negative for sore throat.   Eyes: Negative for pain.  Respiratory: Negative for shortness of breath.   Cardiovascular: Negative for chest pain.  Gastrointestinal: Negative for abdominal pain, nausea and vomiting.  Genitourinary: Negative for dysuria.  Musculoskeletal: Negative for neck pain.  Skin: Negative for rash.  Neurological: Negative for headaches.    Physical Exam Updated Vital Signs BP 140/65 (BP Location: Right Arm)   Pulse 64   Temp 98.7 F (37.1 C) (Oral)   Resp 17   SpO2 97%   Physical Exam Vitals and nursing note reviewed.  Constitutional:      General: He is not in acute distress.    Appearance: Normal appearance. He is well-developed.  HENT:     Head: Normocephalic and  atraumatic.  Eyes:     Conjunctiva/sclera: Conjunctivae normal.  Cardiovascular:     Rate and Rhythm: Normal rate and regular rhythm.     Heart sounds: No murmur heard.   Pulmonary:     Effort: Pulmonary effort is normal. No respiratory distress.     Breath sounds: Normal breath sounds.  Abdominal:     Palpations: Abdomen is soft.     Tenderness: There is no abdominal tenderness.  Musculoskeletal:        General: No deformity or signs of injury. Normal range of motion.     Cervical back: Neck supple.  Skin:    General: Skin is warm and dry.  Neurological:     General: No focal deficit present.     Mental Status: He is alert.     Sensory: No sensory deficit.     Motor: No weakness.     ED Results / Procedures / Treatments   Labs (all labs ordered are listed, but only abnormal results are displayed) Labs Reviewed  COMPREHENSIVE METABOLIC PANEL - Abnormal; Notable for the following components:      Result Value   Sodium 132 (*)    CO2 18 (*)    Glucose, Bld 470 (*)    BUN 70 (*)    Creatinine, Ser 4.15 (*)    Calcium 8.2 (*)    Total Protein 6.4 (*)    Albumin 3.0 (*)    GFR calc non Af Amer 13 (*)    GFR calc Af Amer 15 (*)    All other components within normal limits  CBC WITH DIFFERENTIAL/PLATELET - Abnormal; Notable for the following components:   RBC 3.08 (*)    Hemoglobin 8.8 (*)    HCT 28.8 (*)    All other components within normal limits  CBG MONITORING, ED - Abnormal; Notable for the following components:   Glucose-Capillary 482 (*)    All other components within normal limits  I-STAT VENOUS BLOOD GAS, ED - Abnormal; Notable for the following components:   pCO2, Ven 34.3 (*)    pO2, Ven 47.0 (*)    TCO2 21 (*)    Acid-base deficit 5.0 (*)    Sodium 134 (*)    Calcium, Ion 1.06 (*)    HCT 28.0 (*)    Hemoglobin 9.5 (*)    All other components within normal limits  I-STAT CHEM 8, ED - Abnormal; Notable for the following components:  Sodium 134 (*)     BUN 70 (*)    Creatinine, Ser 4.40 (*)    Glucose, Bld 466 (*)    Calcium, Ion 1.08 (*)    TCO2 19 (*)    Hemoglobin 9.9 (*)    HCT 29.0 (*)    All other components within normal limits  CBG MONITORING, ED - Abnormal; Notable for the following components:   Glucose-Capillary 248 (*)    All other components within normal limits  SARS CORONAVIRUS 2 BY RT PCR [facility] ORDER, [place])  BETA-HYDROXYBUTYRIC ACID    EKG EKG Interpretation  Date/Time:  Tuesday May 01 2020 19:51:31 EDT Ventricular Rate:  87 PR Interval:    QRS Duration: 92 QT Interval:  368 QTC Calculation: 443 R Axis:   -45 Text Interpretation: Sinus rhythm Left anterior fascicular block Anteroseptal infarct, old Nonspecific T abnormalities, lateral leads Confirmed by Aletta Edouard [phone]) on 05/01/2020 7:54:17 PM Also confirmed by Aletta Edouard [phone]), editor Victory Dakin [phone])  on 05/02/2020 7:40:34 AM   Radiology No results found.  Procedures Procedures (including critical care time)  Medications Ordered in ED Medications  sodium chloride 0.9 % bolus 1,000 mL (0 mLs Intravenous Stopped 05/01/20 2145)  insulin aspart (novoLOG) injection 8 Units (8 Units Subcutaneous Given 05/01/20 2109)    ED Course  I have reviewed the triage vital signs and the nursing notes.  Pertinent labs & imaging results that were available during my care of the patient were reviewed by me and considered in my medical decision making (see chart for details).  Clinical Course as of May 03 1051  Tue May 01, 2020  2143 Patient was seen and evaluated by [facility] hospitalist Dr. Alcario Drought.  I consulted him for possible admission versus help with medical management.  He feels the patient is not currently and   [MB]  2157 Discussed with nephrology Dr. Hollie Salk who will arrange the patient get an outpatient referral to the clinic so they can evaluate his CKD.   [MB]  2201 Blood sugar now improved to 248.    [MB]    Clinical Course User Index [MB] Hayden Rasmussen, MD   MDM Rules/Calculators/A&P                         This patient complains of elevated blood sugars, he actually has no complaints; this involves an extensive number of treatment Options and is a complaint that carries with it a high risk of complications and Morbidity. The differential includes hyperglycemia, DKA, metabolic derangement, infection  I ordered, reviewed and interpreted labs, which included CBC with normal white count, stable low hemoglobin, chemistries with elevated glucose and mildly low bicarb although has a normal gap, elevated BUN/creatinine similar to priors.  VBG with normal pH.  Beta hydroxybutyrate not elevated, Covid test negative I ordered medication IV fluids and subcu insulin Previous records obtained and reviewed in epic, frequent almost monthly admissions for DKA. I consulted [facility] hospitalist Dr. Alcario Drought and discussed lab and imaging findings.  We had extensive discussion regarding the patient's presentation, lab abnormalities, and possible need for admission.  After reviewing the medical chart it seems as if they are allowing permissive hyperglycemia due to the patient's frequent hypoglycemic episodes in the setting of brittle diabetes.  Currently not demonstrating DKA physiology.  Discussed with Dr. Hollie Salk nephrology who will arrange outpatient follow-up for evaluation of the patient's CKD as he is trending towards possibly needing  dialysis.  Critical Interventions: None  After the interventions stated above, I reevaluated the patient and found patient to be asymptomatic.  Blood sugars have improved.  Will return to his facility where they can continue to monitor his blood sugars.  Return instructions discussed  Final Clinical Impression(s) / ED Diagnoses Final diagnoses:  Hyperglycemia  Stage 5 chronic kidney disease not on chronic dialysis [facility])    Rx / DC Orders ED Discharge Orders    None        Hayden Rasmussen, MD 05/02/20 1057

## 2020-05-03 ENCOUNTER — Telehealth: Payer: Self-pay

## 2020-05-03 DIAGNOSIS — E1159 Type 2 diabetes mellitus with other circulatory complications: Secondary | ICD-10-CM

## 2020-05-03 DIAGNOSIS — I152 Hypertension secondary to endocrine disorders: Secondary | ICD-10-CM

## 2020-05-03 NOTE — Telephone Encounter (Signed)
Refill request for the furosemide has been denied via fax to [facility]. Rx was last dispensed in Feb with 1 refill. During recent hospital admission they did not give patient furosemide and did not discharge with furosemide.   Sending to PCP for additional advise and/or approval of refill.

## 2020-05-04 MED ORDER — FUROSEMIDE 20 MG PO TABS: 20.0000 mg | ORAL_TABLET | Freq: Every day | ORAL | 1 refills | Status: DC

## 2020-05-04 NOTE — Telephone Encounter (Signed)
Lasix sent to mail order. Please schedule him to follow up with me in 3 months.

## 2020-05-04 NOTE — Telephone Encounter (Signed)
Pt sister [name]) contacted and stated that pt is in a rehab facility at this time. [name] is not sure if the rehab facility is currently administering the medication.

## 2020-05-11 ENCOUNTER — Telehealth: Payer: Self-pay | Admitting: Adult Health Nurse Practitioner

## 2020-05-11 ENCOUNTER — Telehealth: Payer: Self-pay | Admitting: Family Medicine

## 2020-05-11 ENCOUNTER — Encounter: Payer: Self-pay | Admitting: Family Medicine

## 2020-05-11 NOTE — Telephone Encounter (Signed)
Pt was no show for appt 04/17/20. First occurrence. Fee Waived. Letter mailed.  PCP,  Please reply back with corresponding letter matching appropriate follow up needs.  A - No follow up necessary B - Follow up urgent - locate patient immediately to schedule appointment. C - Follow up necessary. Contact patient and schedule visit w/in 7 days. D - Follow up necessary. Contact patient and schedule visit w/in 2-4 weeks.  E - Follow up necessary. Contact patient and schedule visit w/in 3 months.

## 2020-05-11 NOTE — Telephone Encounter (Signed)
Spoke with patient's sister, Nello Corro, regarding Palliative services and all questions were answered.  Sister stated that patient is currently in a rehab facility is supposed to come home on 05/13/20.  I have scheduled an In-person Consult for 05/16/20 @ 2 PM.

## 2020-05-16 ENCOUNTER — Other Ambulatory Visit: Payer: Medicare Other | Admitting: Adult Health Nurse Practitioner

## 2020-05-16 ENCOUNTER — Other Ambulatory Visit: Payer: Self-pay

## 2020-05-16 DIAGNOSIS — E1165 Type 2 diabetes mellitus with hyperglycemia: Secondary | ICD-10-CM

## 2020-05-16 DIAGNOSIS — Z515 Encounter for palliative care: Secondary | ICD-10-CM

## 2020-05-16 NOTE — Progress Notes (Signed)
[facility] Consult Note Telephone: [phone]  Fax: [phone]  PATIENT NAME: [name] [name] DOB: 06/17/1946 MRN: [id number]  PRIMARY CARE PROVIDER:   Libby Maw, MD  REFERRING PROVIDER:  Libby Maw, MD [place],  [place] [address]  RESPONSIBLE PARTY:   [name] [name], sister [phone] or [phone]      RECOMMENDATIONS and PLAN:  1.  Advanced care planning.  Sister states that her brother has voiced that he wants to be a DNR.  She states that if put in that position that she does not think that she could go through with it and would want CPR done.  Left MOST form for them to go over together.  2.  Functional status.  Patient ambulates with walker.  Sister does state that he is fairly independent of ADLs.  Continent of bowel and bladder.  Establish home health PT and work with as ordered.  3.  Support.  Sister is primary caregiver.  She also cares for her mother and brother with Down syndrome in the same home.  Does have sisters who come to help.  Continue to assess for need for help in the home and make social work referral as needed  Patient stable at this time.  Palliative will continue to monitor for symptom management/decline and make recommendations as needed.  Recommend follow-up in about 8 weeks.  Sister is encouraged to call with any questions or concerns  I spent 80 minutes providing this consultation,  from 2:00 to 3:20 including time spent with patient/family, chart review, provider coordination, documentation. More than 50% of the time in this consultation was spent coordinating communication.   HISTORY OF PRESENT ILLNESS:  [name] [name] is a 74 y.o. year old male with multiple medical problems including cognitive impairment post CVA, CKD stage III, DM T2, HTN, anemia, CHF. Palliative Care was asked to help address goals of care.  Patient has had 2 hospitalizations in February of this year  for hypoglycemia.  He was hospitalized once in June of this year for hyperglycemia.  Was hospitalized twice in July of this year for DKA.  Patient has had short-term rehab at [facility].  Patient just came back home 3 days ago.  Home health services are to be started.  Sister does state that she has not been contacted to set up initial evaluation as of yet.  Sister does state that they check his blood sugars fasting in the morning and then before meals.  Patient is currently on Lantus 5 units at night with sliding scale coverage.  Patient states that his appetite is good.  Sister does confirm that he eats well.  Current weight is 155.76 pounds with BMI of 22.71.  Denies pain, headaches, dizziness, fever, shortness of breath, cough, changes in vision, numbness or tingling in extremities, N/V/D, constipation, chest pain, palpitations.  CODE STATUS: see above  PPS: 50% HOSPICE ELIGIBILITY/DIAGNOSIS: TBD  PHYSICAL EXAM:  BP 140/68  HR 70 O2 99% on RA General: NAD, frail appearing, thin Cardiovascular: Irregularly regular rate and rhythm Pulmonary: lung sounds clear; normal respiratory effort Abdomen: soft, nontender, + bowel sounds GU: no suprapubic tenderness Extremities: trace edema, no joint deformities Skin: no rashes on exposed skin Neurological: Weakness; A&O to person and place, did not know month and year   PAST MEDICAL HISTORY:  Past Medical History:  Diagnosis Date  . 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 Closed comminuted  intertrochanteric fracture of left femur ([place])   . Diabetes mellitus without complication ([place])   . Diabetic hyperosmolar non-ketotic state ([place]) 05/15/2016  . DKA (diabetic ketoacidoses) ([place]) 05/09/2016  . Hypertension   . Hypoglycemia 11/29/2019  . Postoperative anemia due to acute blood loss 06/18/2016  . Retroperitoneal hematoma 06/18/2016  . Stroke ([place])   . Vitamin B 12 deficiency 06/18/2016    SOCIAL HX:  Social  History   Tobacco Use  . Smoking status: Former Smoker    Packs/day: 0.25    Years: 30.00    Pack years: 7.50    Types: Cigarettes    Quit date: 02/01/2016    Years since quitting: 4.2  . Smokeless tobacco: Never Used  Substance Use Topics  . Alcohol use: No    ALLERGIES: No Known Allergies   PERTINENT MEDICATIONS:  Outpatient Encounter Medications as of 05/16/2020  Medication Sig  . ACCU-CHEK GUIDE test strip USE AS DIRECTED  . amLODipine (NORVASC) 10 MG tablet TAKE 1 TABLET ONCE DAILY. (Patient taking differently: Take 10 mg by mouth daily. )  . aspirin EC 81 MG EC tablet Take 1 tablet (81 mg total) by mouth daily. Swallow whole.  Marland Kitchen atorvastatin (LIPITOR) 40 MG tablet Take 1 tablet (40 mg total) by mouth daily. (Patient taking differently: Take 40 mg by mouth every evening. )  . Blood Glucose Monitoring Suppl (ACCU-CHEK GUIDE) w/Device KIT 1 Device by Does not apply route QID. (Patient not taking: Reported on 01/03/2020)  . furosemide (LASIX) 20 MG tablet Take 1 tablet (20 mg total) by mouth daily.  Marland Kitchen glucose blood (ACCU-CHEK GUIDE) test strip Use as instructed  . glucose blood (TRUE METRIX BLOOD GLUCOSE TEST) test strip CHECK BLOOD SUGAR UP TO 4 TIMES A DAY. (Patient taking differently: 1 each by Other route See admin instructions. CHECK BLOOD SUGAR UP TO 2 TIMES A DAY.)  . hydrALAZINE (APRESOLINE) 100 MG tablet Take 1 tablet (100 mg total) by mouth 3 (three) times daily.  . insulin glargine (LANTUS) 100 UNIT/ML injection Inject 0.05 mLs (5 Units total) into the skin at bedtime.  . insulin lispro (ADMELOG SOLOSTAR) 100 UNIT/ML KwikPen Inject 0-9 Units into the skin 4 (four) times daily -  before meals and at bedtime. Sliding Scale  If 0-120= 0 units 121-150= 1 unit 151-200= 2 units 201-250= 3 units 251-300= 5 units 301-350= 7 units 351-400= 9 units Call PCP if CBG 70 or >400  . Insulin Pen Needle 32G X 4 MM MISC 1 Device by Does not apply route in the morning, at noon, in the  evening, and at bedtime.  . Insulin Syringe-Needle U-100 (BD INSULIN SYRINGE U/F) 31G X 5/16" 0.3 ML MISC Inject 1 each as directed 2 (two) times daily. as directed  . levothyroxine (SYNTHROID) 25 MCG tablet Take 25 mcg by mouth daily before breakfast.   . sodium bicarbonate 650 MG tablet Take 1 tablet (650 mg total) by mouth 2 (two) times daily.  . vitamin B-12 1000 MCG tablet Take 1 tablet (1,000 mcg total) by mouth daily.  . Vitamin D, Ergocalciferol, (DRISDOL) 1.25 MG (50000 UNIT) CAPS capsule Take 50,000 Units by mouth every 7 (seven) days. Thursdays   No facility-administered encounter medications on file as of 05/16/2020.     Keith Felten Jenetta Downer, NP

## 2020-05-28 ENCOUNTER — Other Ambulatory Visit: Payer: Self-pay | Admitting: Family Medicine

## 2020-06-02 IMAGING — US US RENAL
1 series · 14 of 25 positions shown · non-contrast
Comparison: Renal ultrasound performed 12/28/2016, and CT of the
abdomen and pelvis performed 07/06/2016

CLINICAL DATA: Acute onset of renal insufficiency.

EXAM:
RENAL / URINARY TRACT ULTRASOUND COMPLETE

[Series 1: us renal · 0.23mm/px · 14 of 37 slices shown]
[im 1/37]
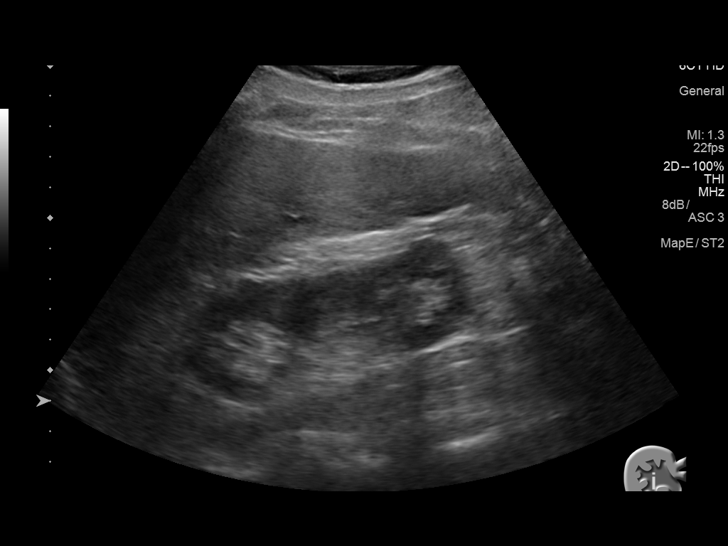
[im 4/37]
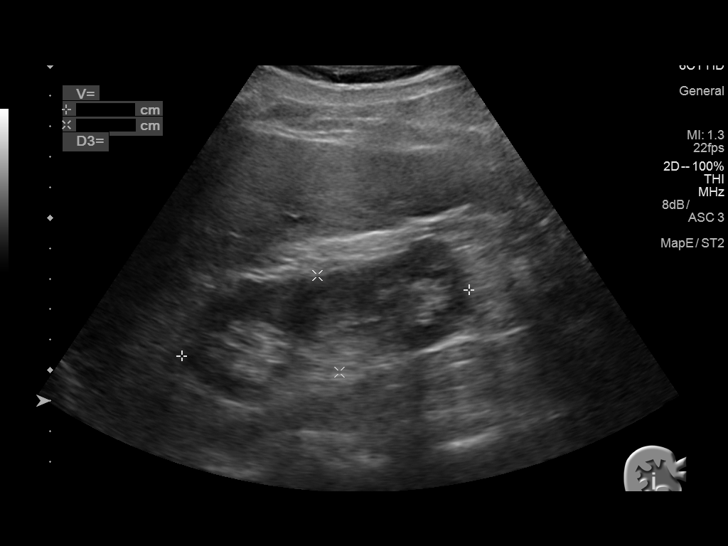
[im 7/37]
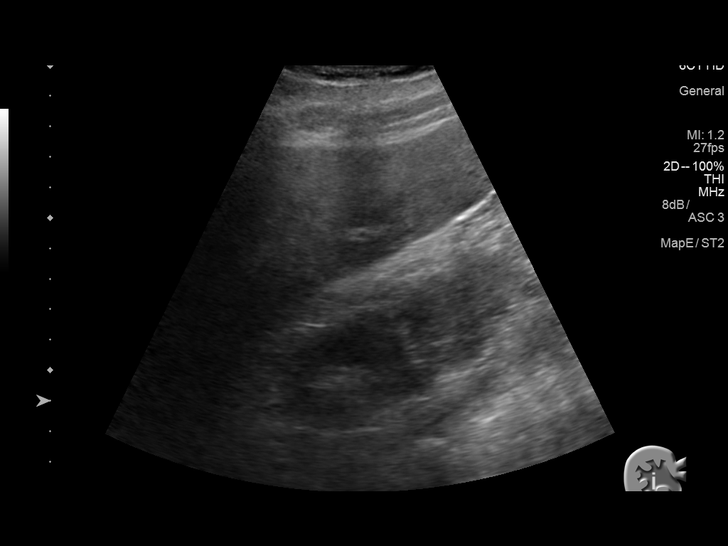
[im 10/37]
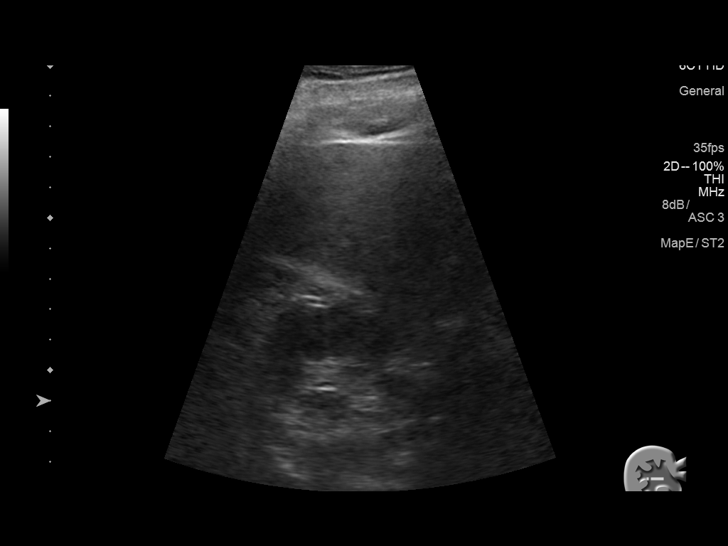
[im 13/37]
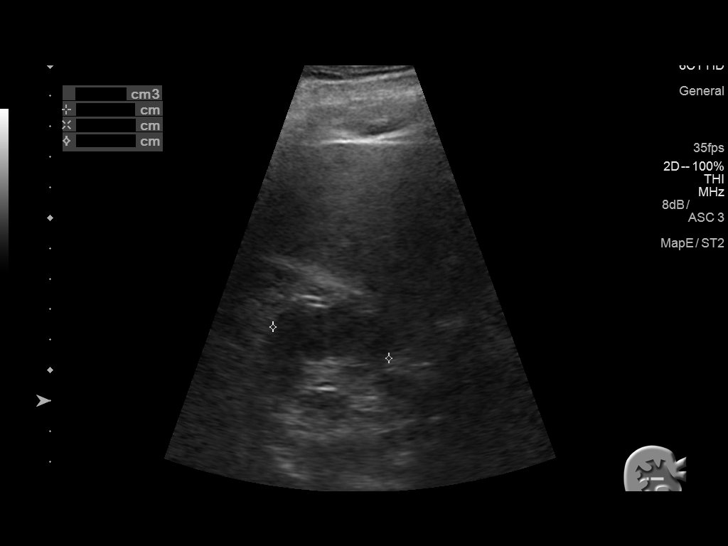
[im 14/37]
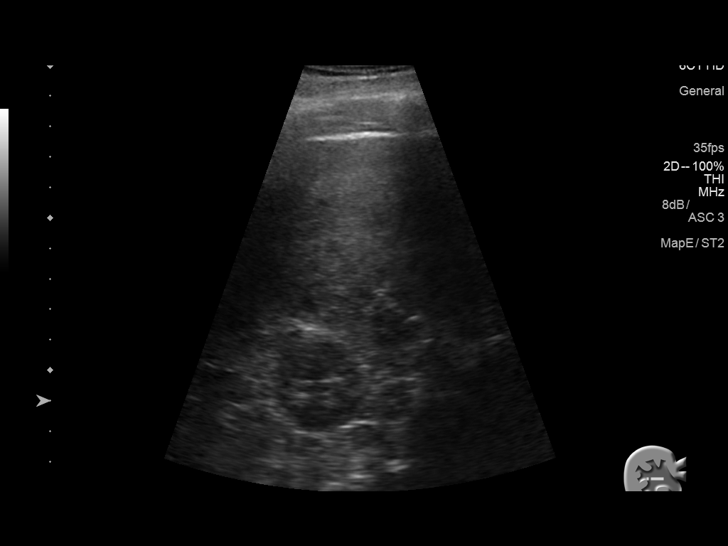
[im 17/37]
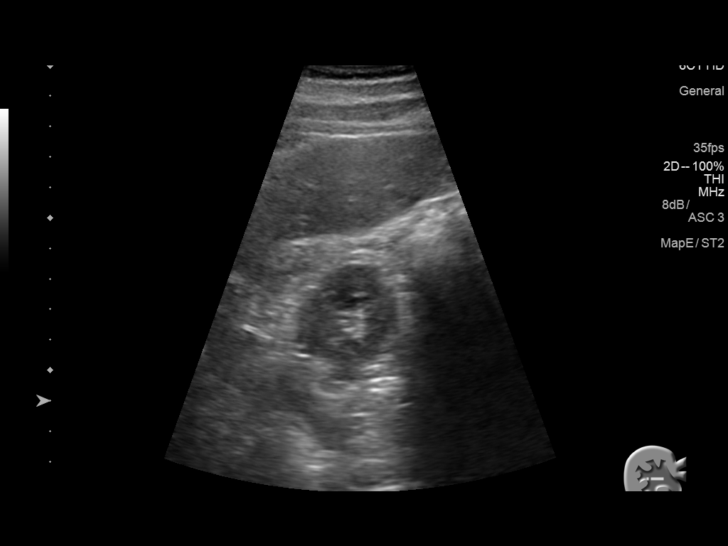
[im 20/37]
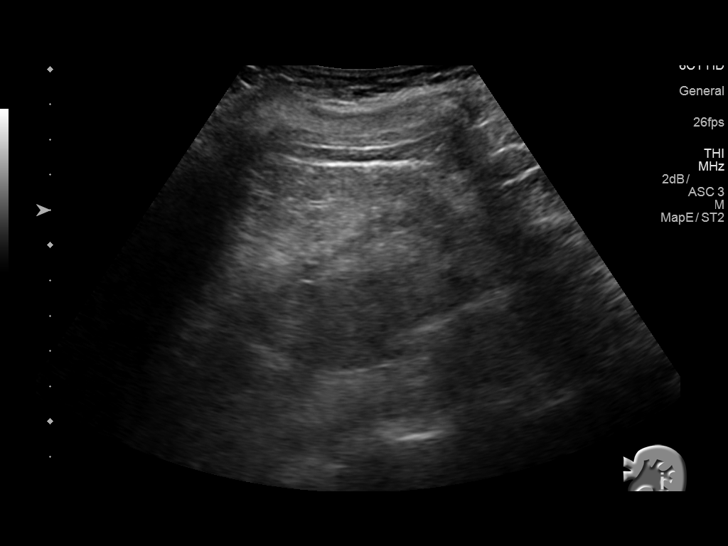
[im 23/37]
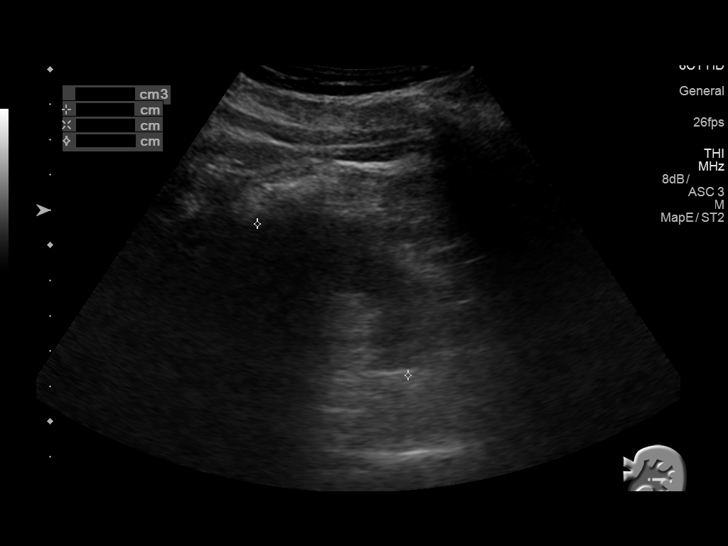
[im 25/37]
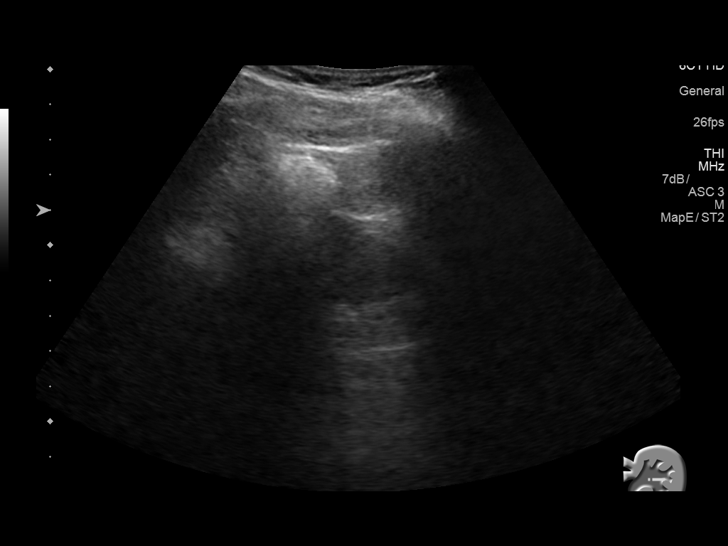
[im 28/37]
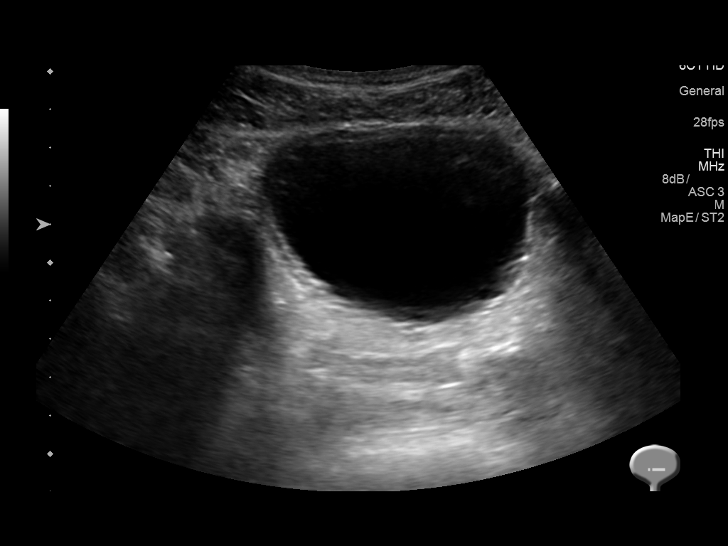
[im 31/37]
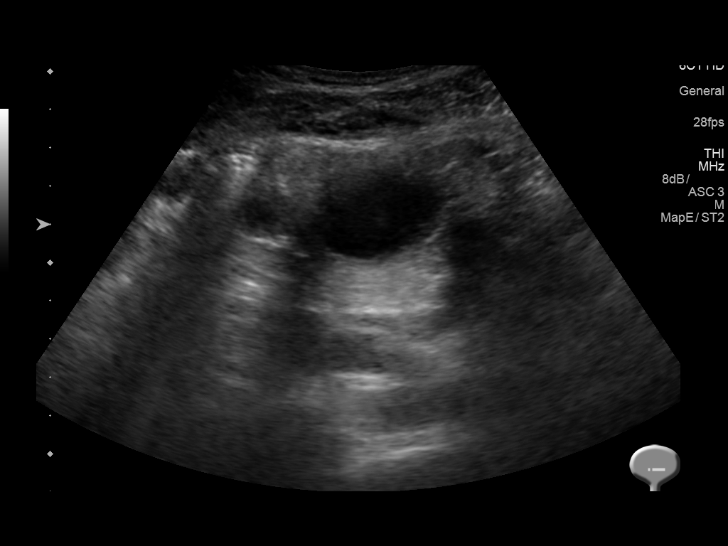
[im 34/37]
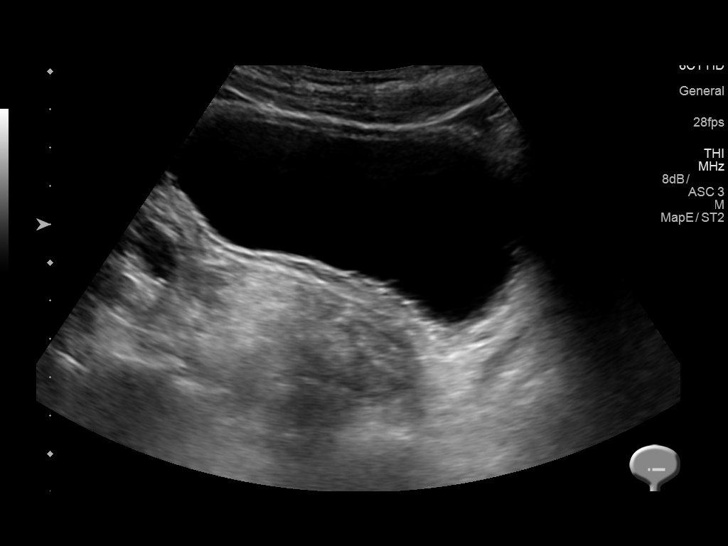
[im 37/37]
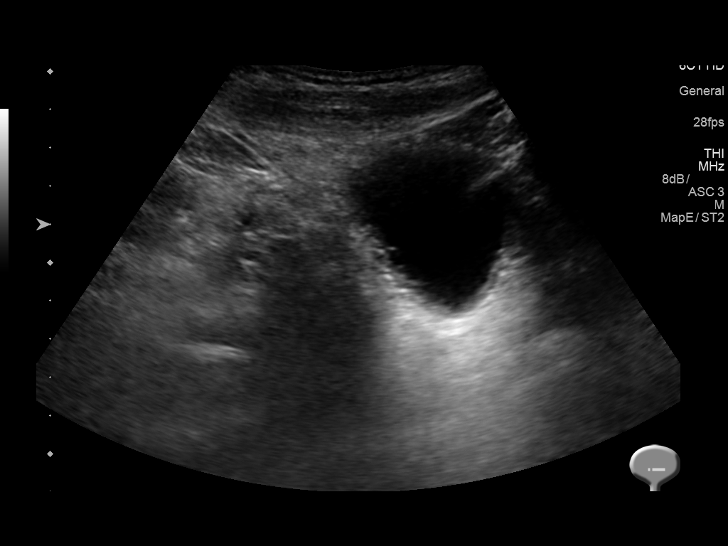

[14 of 25 positions shown; findings below may reference images not displayed]

FINDINGS: Right Kidney:

Renal measurements: 9.7 x 3.3 x 3.9 cm = volume: 64.8 mL.
Echogenicity within normal limits. No mass or hydronephrosis
visualized.

Left Kidney:

Renal measurements: 9.6 x 4.3 x 6.1 cm = volume: 130.0 mL.
Echogenicity within normal limits. No mass or hydronephrosis
visualized.

Bladder:

Appears normal for degree of bladder distention.
IMPRESSION: Unremarkable renal ultrasound.  No evidence of hydronephrosis.

## 2020-06-10 ENCOUNTER — Emergency Department (HOSPITAL_COMMUNITY)
Admission: EM | Admit: 2020-06-10 | Discharge: 2020-06-10 | Disposition: A | Discharge status: Home / Self Care | Payer: Medicare Other | Attending: Emergency Medicine | Admitting: Emergency Medicine

## 2020-06-10 ENCOUNTER — Encounter (HOSPITAL_COMMUNITY): Payer: Self-pay

## 2020-06-10 ENCOUNTER — Other Ambulatory Visit: Payer: Self-pay

## 2020-06-10 ENCOUNTER — Emergency Department (HOSPITAL_COMMUNITY): Payer: Medicare Other

## 2020-06-10 DIAGNOSIS — N184 Chronic kidney disease, stage 4 (severe): Secondary | ICD-10-CM | POA: Diagnosis not present

## 2020-06-10 DIAGNOSIS — Z794 Long term (current) use of insulin: Secondary | ICD-10-CM | POA: Insufficient documentation

## 2020-06-10 DIAGNOSIS — Z8673 Personal history of transient ischemic attack (TIA), and cerebral infarction without residual deficits: Secondary | ICD-10-CM | POA: Insufficient documentation

## 2020-06-10 DIAGNOSIS — Z79890 Hormone replacement therapy (postmenopausal): Secondary | ICD-10-CM | POA: Insufficient documentation

## 2020-06-10 DIAGNOSIS — E1122 Type 2 diabetes mellitus with diabetic chronic kidney disease: Secondary | ICD-10-CM | POA: Insufficient documentation

## 2020-06-10 DIAGNOSIS — E1110 Type 2 diabetes mellitus with ketoacidosis without coma: Secondary | ICD-10-CM | POA: Insufficient documentation

## 2020-06-10 DIAGNOSIS — I130 Hypertensive heart and chronic kidney disease with heart failure and stage 1 through stage 4 chronic kidney disease, or unspecified chronic kidney disease: Secondary | ICD-10-CM | POA: Insufficient documentation

## 2020-06-10 DIAGNOSIS — Z20822 Contact with and (suspected) exposure to covid-19: Secondary | ICD-10-CM | POA: Insufficient documentation

## 2020-06-10 DIAGNOSIS — Z87891 Personal history of nicotine dependence: Secondary | ICD-10-CM | POA: Diagnosis not present

## 2020-06-10 DIAGNOSIS — E1140 Type 2 diabetes mellitus with diabetic neuropathy, unspecified: Secondary | ICD-10-CM | POA: Diagnosis not present

## 2020-06-10 DIAGNOSIS — R739 Hyperglycemia, unspecified: Secondary | ICD-10-CM

## 2020-06-10 DIAGNOSIS — E039 Hypothyroidism, unspecified: Secondary | ICD-10-CM | POA: Diagnosis not present

## 2020-06-10 DIAGNOSIS — Z79899 Other long term (current) drug therapy: Secondary | ICD-10-CM | POA: Insufficient documentation

## 2020-06-10 DIAGNOSIS — Z86718 Personal history of other venous thrombosis and embolism: Secondary | ICD-10-CM | POA: Insufficient documentation

## 2020-06-10 DIAGNOSIS — Z7982 Long term (current) use of aspirin: Secondary | ICD-10-CM | POA: Diagnosis not present

## 2020-06-10 DIAGNOSIS — I5032 Chronic diastolic (congestive) heart failure: Secondary | ICD-10-CM | POA: Insufficient documentation

## 2020-06-10 DIAGNOSIS — Z96641 Presence of right artificial hip joint: Secondary | ICD-10-CM | POA: Insufficient documentation

## 2020-06-10 DIAGNOSIS — E1165 Type 2 diabetes mellitus with hyperglycemia: Secondary | ICD-10-CM | POA: Insufficient documentation

## 2020-06-10 LAB — URINALYSIS, ROUTINE W REFLEX MICROSCOPIC
Bacteria, UA: NONE SEEN
Bilirubin Urine: NEGATIVE
Glucose, UA: >=500 mg/dL — AB
Ketones, ur: NEGATIVE mg/dL
Leukocytes,Ua: NEGATIVE
Nitrite: NEGATIVE
Protein, ur: >=300 mg/dL — AB
Specific Gravity, Urine: 1.012 (ref 1.005–1.030)
pH: 7 (ref 5.0–8.0)

## 2020-06-10 LAB — CBC WITH DIFFERENTIAL/PLATELET
Abs Immature Granulocytes: 0.01 10*3/uL (ref 0.00–0.07)
Basophils Absolute: 0 10*3/uL (ref 0.0–0.1)
Basophils Relative: 1 %
Eosinophils Absolute: 0.4 10*3/uL (ref 0.0–0.5)
Eosinophils Relative: 5 %
HCT: 23.3 % — ABNORMAL LOW (ref 39.0–52.0)
Hemoglobin: 7.5 g/dL — ABNORMAL LOW (ref 13.0–17.0)
Immature Granulocytes: 0 %
Lymphocytes Relative: 17 %
Lymphs Abs: 1.1 10*3/uL (ref 0.7–4.0)
MCH: 29.4 pg (ref 26.0–34.0)
MCHC: 32.2 g/dL (ref 30.0–36.0)
MCV: 91.4 fL (ref 80.0–100.0)
Monocytes Absolute: 0.7 10*3/uL (ref 0.1–1.0)
Monocytes Relative: 11 %
Neutro Abs: 4.3 10*3/uL (ref 1.7–7.7)
Neutrophils Relative %: 66 %
Platelets: 211 10*3/uL (ref 150–400)
RBC: 2.55 MIL/uL — ABNORMAL LOW (ref 4.22–5.81)
RDW: 14 % (ref 11.5–15.5)
WBC: 6.4 10*3/uL (ref 4.0–10.5)
nRBC: 0 % (ref 0.0–0.2)

## 2020-06-10 LAB — COMPREHENSIVE METABOLIC PANEL
ALT: 10 U/L (ref 0–44)
AST: 13 U/L — ABNORMAL LOW (ref 15–41)
Albumin: 3 g/dL — ABNORMAL LOW (ref 3.5–5.0)
Alkaline Phosphatase: 66 U/L (ref 38–126)
Anion gap: 10 (ref 5–15)
BUN: 66 mg/dL — ABNORMAL HIGH (ref 8–23)
CO2: 21 mmol/L — ABNORMAL LOW (ref 22–32)
Calcium: 8 mg/dL — ABNORMAL LOW (ref 8.9–10.3)
Chloride: 102 mmol/L (ref 98–111)
Creatinine, Ser: 4.84 mg/dL — ABNORMAL HIGH (ref 0.61–1.24)
GFR calc Af Amer: 13 mL/min — ABNORMAL LOW (ref >60)
GFR calc non Af Amer: 11 mL/min — ABNORMAL LOW (ref >60)
Glucose, Bld: 518 mg/dL (ref 70–99)
Potassium: 3.9 mmol/L (ref 3.5–5.1)
Sodium: 133 mmol/L — ABNORMAL LOW (ref 135–145)
Total Bilirubin: 0.4 mg/dL (ref 0.3–1.2)
Total Protein: 6.1 g/dL — ABNORMAL LOW (ref 6.5–8.1)

## 2020-06-10 LAB — POC OCCULT BLOOD, ED: Fecal Occult Bld: NEGATIVE

## 2020-06-10 LAB — CBG MONITORING, ED
Glucose-Capillary: 480 mg/dL — ABNORMAL HIGH (ref 70–99)
Glucose-Capillary: 384 mg/dL — ABNORMAL HIGH (ref 70–99)

## 2020-06-10 LAB — SARS CORONAVIRUS 2 BY RT PCR (HOSPITAL ORDER, PERFORMED IN ~~LOC~~ HOSPITAL LAB): SARS Coronavirus 2: NEGATIVE

## 2020-06-10 MED ORDER — SODIUM CHLORIDE 0.9 % IV BOLUS
500.0000 mL | Freq: Once | INTRAVENOUS | Status: AC
  Administered 2020-06-10: 500 mL via INTRAVENOUS

## 2020-06-10 MED ORDER — INSULIN ASPART 100 UNIT/ML ~~LOC~~ SOLN
6.0000 [IU] | Freq: Once | SUBCUTANEOUS | Status: AC
  Administered 2020-06-10: 6 [IU] via INTRAVENOUS
  Filled 2020-06-10: qty 0.06

## 2020-06-10 NOTE — ED Triage Notes (Signed)
His family phoned d/t pt. Having a high blood sugar. He arrives awake, alert and in no distress and in good spirits.

## 2020-06-10 NOTE — Discharge Instructions (Addendum)
Please return for any problem.  Follow up with your regular care provider tomorrow.   You would benefit from adjustment of your diabetes medications to prevent significantly elevated blood sugars.

## 2020-06-10 NOTE — ED Provider Notes (Signed)
[facility] DEPT Provider Note   CSN: [id number] Arrival date & time: 06/10/20  1751     History Chief Complaint  Patient presents with  . Hyperglycemia    [name] [name] is a 74 y.o. male.  74 year old male with prior medical history as detailed below presents for evaluation of elevated BG. Patient otherwise without complaint. Reported sugars reading "HI" for last two days.   No associated fever, nausea, vomiting, pain, dyspnea, or other complaint.     The history is provided by the patient, medical records and the EMS personnel.  Hyperglycemia Blood sugar level PTA:  "Hi" Severity:  Moderate Onset quality:  Unable to specify Timing:  Unable to specify Progression:  Unable to specify Diabetes status:  Unable to specify      Past Medical History:  Diagnosis Date  . 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 Closed comminuted intertrochanteric fracture of left femur ([place])   . Diabetes mellitus without complication ([place])   . Diabetic hyperosmolar non-ketotic state ([place]) 05/15/2016  . DKA (diabetic ketoacidoses) ([place]) 05/09/2016  . Hypertension   . Hypoglycemia 11/29/2019  . Postoperative anemia due to acute blood loss 06/18/2016  . Retroperitoneal hematoma 06/18/2016  . Stroke ([place])   . Vitamin B 12 deficiency 06/18/2016    Patient Active Problem List   Diagnosis Date Noted  . Thrombocytopenia ([place]) 04/23/2020  . Hypothyroidism 04/22/2020  . DKA (diabetic ketoacidoses) ([place]) 04/14/2020  . PVD (peripheral vascular disease) ([place]) 02/02/2020  . Poor social situation 12/08/2019  . CKD (chronic kidney disease) stage 4, GFR 15-29 ml/min ([name]) 12/08/2019  . Cognitive decline 11/22/2019  . ETOH abuse 05/17/2019  . Chronic diastolic CHF (congestive heart failure) ([place]) 05/17/2019  . Uncontrolled type 2 diabetes mellitus with hyperglycemia, with long-term current use of insulin ([place]) 10/03/2018  .  Underweight 11/18/2017  . Anemia 11/18/2017  . History of cerebrovascular accident (CVA) in adulthood 07/25/2017  . Depression with anxiety 07/25/2017  . Left ventricular diastolic dysfunction 25/00/3704  . Dementia ([place]) 07/25/2017  . Brittle diabetes mellitus ([place]) 06/28/2017  . Hyperlipidemia associated with type 2 diabetes mellitus ([place]) 06/27/2017  . Hypertension associated with diabetes ([place]) 06/27/2017  . Weakness 11/16/2016  . Acute renal failure superimposed on stage 4 chronic kidney disease ([place])   . Noncompliance with medication regimen 08/17/2016  . History of DVT (deep vein thrombosis) 07/06/2016  . Vitamin B 12 deficiency 06/18/2016  . Hyponatremia 05/15/2016  . Anxiety 05/15/2016  . Acute on chronic renal failure ([place]) 05/09/2016  . Narcotic dependency, continuous ([place]) 11/02/2015  . Uncontrolled type 2 diabetes mellitus with diabetic nephropathy, with long-term current use of insulin ([place]) 08/12/2015  . Anemia of chronic disease 02/02/2013    Past Surgical History:  Procedure Laterality Date  . HIP ARTHROPLASTY Right 02/05/2013   Procedure: ARTHROPLASTY BIPOLAR HIP;  Surgeon: [name], MD;  Location: [facility];  Service: Orthopedics;  Laterality: Right;  . INTRAMEDULLARY (IM) NAIL INTERTROCHANTERIC Left 06/16/2016   Procedure: INTRAMEDULLARY (IM) NAIL INTERTROCHANTRIC;  Surgeon: Rod Can, MD;  Location: [facility];  Service: Orthopedics;  Laterality: Left;       Family History  Problem Relation Age of Onset  . Alzheimer's disease Mother   . Hypertension Mother   . Hyperlipidemia Father   . Hypertension Father   . Healthy Maternal Grandmother   . Pneumonia Maternal Grandfather     Social History   Tobacco Use  . Smoking  status: Former Smoker    Packs/day: 0.25    Years: 30.00    Pack years: 7.50    Types: Cigarettes    Quit date: 02/01/2016    Years since quitting: 4.3  . Smokeless tobacco: Never Used  Vaping Use  . Vaping Use: Never used   Substance Use Topics  . Alcohol use: No  . Drug use: No    Home Medications Prior to Admission medications   Medication Sig Start Date End Date Taking? Authorizing Provider  ACCU-CHEK GUIDE test strip USE AS DIRECTED 12/19/19   Azzie Glatter, [name]  amLODipine (NORVASC) 10 MG tablet TAKE 1 TABLET ONCE DAILY. Patient taking differently: Take 10 mg by mouth daily.  09/28/19   Azzie Glatter, [name]  aspirin EC 81 MG EC tablet Take 1 tablet (81 mg total) by mouth daily. Swallow whole. 04/28/20   Barb Merino, MD  atorvastatin (LIPITOR) 40 MG tablet Take 1 tablet (40 mg total) by mouth daily. Patient taking differently: Take 40 mg by mouth every evening.  03/29/20   Lurline Del, DO  Blood Glucose Monitoring Suppl (ACCU-CHEK GUIDE) w/Device KIT 1 Device by Does not apply route QID. Patient not taking: Reported on 01/03/2020 11/02/19   Azzie Glatter, [name]  furosemide (LASIX) 20 MG tablet Take 1 tablet (20 mg total) by mouth daily. 05/04/20   Libby Maw, MD  glucose blood (ACCU-CHEK GUIDE) test strip Use as instructed 11/03/18   Azzie Glatter, [name]  glucose blood (TRUE METRIX BLOOD GLUCOSE TEST) test strip CHECK BLOOD SUGAR UP TO 4 TIMES A DAY. Patient taking differently: 1 each by Other route See admin instructions. CHECK BLOOD SUGAR UP TO 2 TIMES A DAY. 05/25/18   Azzie Glatter, [name]  hydrALAZINE (APRESOLINE) 100 MG tablet Take 1 tablet (100 mg total) by mouth 3 (three) times daily. 04/19/20   [name], MD  insulin glargine (LANTUS) 100 UNIT/ML injection Inject 0.05 mLs (5 Units total) into the skin at bedtime. 04/28/20   Barb Merino, MD  insulin lispro (ADMELOG SOLOSTAR) 100 UNIT/ML KwikPen Inject 0-9 Units into the skin 4 (four) times daily -  before meals and at bedtime. Sliding Scale  If 0-120= 0 units 121-150= 1 unit 151-200= 2 units 201-250= 3 units 251-300= 5 units 301-350= 7 units 351-400= 9 units Call PCP if CBG 70 or >400    [provider]   Insulin Pen Needle 32G X 4 MM MISC 1 Device by Does not apply route in the morning, at noon, in the evening, and at bedtime. 01/03/20   [name], [name], MD  Insulin Syringe-Needle U-100 (BD INSULIN SYRINGE U/F) 31G X 5/16" 0.3 ML MISC Inject 1 each as directed 2 (two) times daily. as directed 11/02/19   Azzie Glatter, [name]  levothyroxine (SYNTHROID) 25 MCG tablet Take 25 mcg by mouth daily before breakfast.  04/13/20   [provider]  sodium bicarbonate 650 MG tablet Take 1 tablet (650 mg total) by mouth 2 (two) times daily. 04/19/20   [name], MD  vitamin B-12 1000 MCG tablet Take 1 tablet (1,000 mcg total) by mouth daily. 12/05/19   [name], MD  Vitamin D, Ergocalciferol, (DRISDOL) 1.25 MG (50000 UNIT) CAPS capsule Take 50,000 Units by mouth every 7 (seven) days. Thursdays    [provider]    Allergies    Patient has no known allergies.  Review of Systems   Review of Systems  All other systems reviewed and  are negative.   Physical Exam Updated Vital Signs BP 139/72 (BP Location: Left Arm)   Pulse 69   Temp 97.9 F (36.6 C) (Oral)   Resp (!) 21   Ht 5' 9.5" (1.765 m)   Wt 63.5 kg   SpO2 97%   BMI 20.38 kg/m   Physical Exam Vitals and nursing note reviewed.  Constitutional:      General: He is not in acute distress.    Appearance: Normal appearance. He is well-developed.  HENT:     Head: Normocephalic and atraumatic.     Mouth/Throat:     Mouth: Mucous membranes are moist.  Eyes:     Conjunctiva/sclera: Conjunctivae normal.     Pupils: Pupils are equal, round, and reactive to light.  Cardiovascular:     Rate and Rhythm: Normal rate and regular rhythm.     Heart sounds: Normal heart sounds.  Pulmonary:     Effort: Pulmonary effort is normal. No respiratory distress.     Breath sounds: Normal breath sounds.  Abdominal:     General: There is no distension.     Palpations: Abdomen is soft.     Tenderness: There is no abdominal  tenderness.  Musculoskeletal:        General: No deformity. Normal range of motion.     Cervical back: Normal range of motion and neck supple.  Skin:    General: Skin is warm and dry.  Neurological:     Mental Status: He is alert and oriented to person, place, and time.     ED Results / Procedures / Treatments   Labs (all labs ordered are listed, but only abnormal results are displayed) Labs Reviewed  COMPREHENSIVE METABOLIC PANEL - Abnormal; Notable for the following components:      Result Value   Sodium 133 (*)    CO2 21 (*)    Glucose, Bld 518 (*)    BUN 66 (*)    Creatinine, Ser 4.84 (*)    Calcium 8.0 (*)    Total Protein 6.1 (*)    Albumin 3.0 (*)    AST 13 (*)    GFR calc non Af Amer 11 (*)    GFR calc Af Amer 13 (*)    All other components within normal limits  CBC WITH DIFFERENTIAL/PLATELET - Abnormal; Notable for the following components:   RBC 2.55 (*)    Hemoglobin 7.5 (*)    HCT 23.3 (*)    All other components within normal limits  URINALYSIS, ROUTINE W REFLEX MICROSCOPIC - Abnormal; Notable for the following components:   Color, Urine STRAW (*)    Glucose, UA >=500 (*)    Hgb urine dipstick SMALL (*)    Protein, ur >=300 (*)    All other components within normal limits  CBG MONITORING, ED - Abnormal; Notable for the following components:   Glucose-Capillary 480 (*)    All other components within normal limits  CBG MONITORING, ED - Abnormal; Notable for the following components:   Glucose-Capillary 384 (*)    All other components within normal limits  SARS CORONAVIRUS 2 BY RT PCR [facility] ORDER, [place] LAB)  POC OCCULT BLOOD, ED    EKG EKG Interpretation  Date/Time:  Sunday June 10 2020 18:09:46 EDT Ventricular Rate:  66 PR Interval:    QRS Duration: 94 QT Interval:  435 QTC Calculation: 456 R Axis:   -23 Text Interpretation: Sinus rhythm Anterior infarct, old Confirmed by Dene Gentry (  [id number]) on 06/10/2020  6:12:39 PM   Radiology DG Chest Port 1 View  Result Date: 06/10/2020 CLINICAL DATA:  Dyspnea EXAM: PORTABLE CHEST 1 VIEW COMPARISON:  Chest radiograph dated 04/22/2020 FINDINGS: The heart size and mediastinal contours are within normal limits. Both lungs are clear. The visualized skeletal structures are unremarkable. IMPRESSION: No active disease. Electronically Signed   By: Zerita Boers M.D.   On: 06/10/2020 18:37    Procedures Procedures (including critical care time)  Medications Ordered in ED Medications - No data to display  ED Course  I have reviewed the triage vital signs and the nursing notes.  Pertinent labs & imaging results that were available during my care of the patient were reviewed by me and considered in my medical decision making (see chart for details).    MDM Rules/Calculators/A&P                          MDM  Screen complete  Daishaun Ayre was evaluated in Emergency Department on 06/10/2020 for the symptoms described in the history of present illness. He was evaluated in the context of the global COVID-19 pandemic, which necessitated consideration that the patient might be at risk for infection with the SARS-CoV-2 virus that causes COVID-19. Institutional protocols and algorithms that pertain to the evaluation of patients at risk for COVID-19 are in a state of rapid change based on information released by regulatory bodies including the CDC and federal and state organizations. These policies and algorithms were followed during the patient's care in the ED.  Patient is presenting for evaluation of reported hyperglycemia.  Patient without evidence of concurrent DKA or other metabolic derangement.  Patient without evidence of infection.  Hyperglycemia improved following treatment in the ED.  Patient again remains asymptomatic.  He is appropriate for outpatient management.  Importance of close follow-up is stressed.  Strict return precautions given and  understood.  Final Clinical Impression(s) / ED Diagnoses Final diagnoses:  Hyperglycemia    Rx / DC Orders ED Discharge Orders    None       Valarie Merino, MD 06/10/20 2218

## 2020-06-25 ENCOUNTER — Telehealth: Payer: Self-pay | Admitting: Family Medicine

## 2020-06-25 NOTE — Telephone Encounter (Signed)
error 

## 2020-06-26 ENCOUNTER — Other Ambulatory Visit: Payer: Self-pay

## 2020-06-26 ENCOUNTER — Telehealth: Payer: Self-pay | Admitting: Family Medicine

## 2020-06-26 DIAGNOSIS — E1159 Type 2 diabetes mellitus with other circulatory complications: Secondary | ICD-10-CM

## 2020-06-26 DIAGNOSIS — I10 Essential (primary) hypertension: Secondary | ICD-10-CM

## 2020-06-26 DIAGNOSIS — I152 Hypertension secondary to endocrine disorders: Secondary | ICD-10-CM

## 2020-06-26 MED ORDER — FUROSEMIDE 20 MG PO TABS: 20.0000 mg | ORAL_TABLET | Freq: Every day | ORAL | 1 refills | Status: DC

## 2020-06-26 MED ORDER — ATORVASTATIN CALCIUM 40 MG PO TABS
40.0000 mg | ORAL_TABLET | Freq: Every day | ORAL | 0 refills | Status: DC
Start: 2020-06-26 — End: 2020-10-22

## 2020-06-26 MED ORDER — AMLODIPINE BESYLATE 10 MG PO TABS: 10.0000 mg | ORAL_TABLET | Freq: Every day | ORAL | 0 refills | Status: DC

## 2020-06-26 MED ORDER — LEVOTHYROXINE SODIUM 25 MCG PO TABS: 25.0000 ug | ORAL_TABLET | Freq: Every day | ORAL | 0 refills | Status: DC

## 2020-06-26 NOTE — Telephone Encounter (Signed)
Patient is calling and requesting a call back regarding medication, please advise. CB is [phone]

## 2020-06-26 NOTE — Telephone Encounter (Signed)
Patient sister calling for refill on patients medications. Refills sent in patient scheduled to come in for follow up.

## 2020-06-27 ENCOUNTER — Other Ambulatory Visit: Payer: Self-pay | Admitting: Family Medicine

## 2020-07-06 ENCOUNTER — Encounter (HOSPITAL_COMMUNITY): Payer: Self-pay | Admitting: Emergency Medicine

## 2020-07-06 ENCOUNTER — Emergency Department (HOSPITAL_COMMUNITY)
Admission: EM | Admit: 2020-07-06 | Discharge: 2020-07-06 | Disposition: A | Discharge status: Home / Self Care | Payer: Medicare Other | Attending: Emergency Medicine | Admitting: Emergency Medicine

## 2020-07-06 ENCOUNTER — Other Ambulatory Visit: Payer: Self-pay

## 2020-07-06 ENCOUNTER — Emergency Department (HOSPITAL_COMMUNITY): Payer: Medicare Other

## 2020-07-06 DIAGNOSIS — Z79890 Hormone replacement therapy (postmenopausal): Secondary | ICD-10-CM | POA: Diagnosis not present

## 2020-07-06 DIAGNOSIS — E039 Hypothyroidism, unspecified: Secondary | ICD-10-CM | POA: Diagnosis not present

## 2020-07-06 DIAGNOSIS — Z96641 Presence of right artificial hip joint: Secondary | ICD-10-CM | POA: Insufficient documentation

## 2020-07-06 DIAGNOSIS — S299XXA Unspecified injury of thorax, initial encounter: Secondary | ICD-10-CM | POA: Diagnosis present

## 2020-07-06 DIAGNOSIS — E1122 Type 2 diabetes mellitus with diabetic chronic kidney disease: Secondary | ICD-10-CM | POA: Insufficient documentation

## 2020-07-06 DIAGNOSIS — Z7982 Long term (current) use of aspirin: Secondary | ICD-10-CM | POA: Diagnosis not present

## 2020-07-06 DIAGNOSIS — Z79899 Other long term (current) drug therapy: Secondary | ICD-10-CM | POA: Insufficient documentation

## 2020-07-06 DIAGNOSIS — I130 Hypertensive heart and chronic kidney disease with heart failure and stage 1 through stage 4 chronic kidney disease, or unspecified chronic kidney disease: Secondary | ICD-10-CM | POA: Diagnosis not present

## 2020-07-06 DIAGNOSIS — I5032 Chronic diastolic (congestive) heart failure: Secondary | ICD-10-CM | POA: Insufficient documentation

## 2020-07-06 DIAGNOSIS — S20211A Contusion of right front wall of thorax, initial encounter: Secondary | ICD-10-CM | POA: Diagnosis not present

## 2020-07-06 DIAGNOSIS — W01198A Fall on same level from slipping, tripping and stumbling with subsequent striking against other object, initial encounter: Secondary | ICD-10-CM | POA: Insufficient documentation

## 2020-07-06 DIAGNOSIS — N184 Chronic kidney disease, stage 4 (severe): Secondary | ICD-10-CM | POA: Diagnosis not present

## 2020-07-06 DIAGNOSIS — Z794 Long term (current) use of insulin: Secondary | ICD-10-CM | POA: Insufficient documentation

## 2020-07-06 LAB — CBG MONITORING, ED
Glucose-Capillary: 284 mg/dL — ABNORMAL HIGH (ref 70–99)
Glucose-Capillary: 271 mg/dL — ABNORMAL HIGH (ref 70–99)
Glucose-Capillary: 253 mg/dL — ABNORMAL HIGH (ref 70–99)

## 2020-07-06 LAB — CBC
HCT: 29.8 % — ABNORMAL LOW (ref 39.0–52.0)
Hemoglobin: 9.1 g/dL — ABNORMAL LOW (ref 13.0–17.0)
MCH: 29.2 pg (ref 26.0–34.0)
MCHC: 30.5 g/dL (ref 30.0–36.0)
MCV: 95.5 fL (ref 80.0–100.0)
Platelets: 190 10*3/uL (ref 150–400)
RBC: 3.12 MIL/uL — ABNORMAL LOW (ref 4.22–5.81)
RDW: 14.4 % (ref 11.5–15.5)
WBC: 5.9 10*3/uL (ref 4.0–10.5)
nRBC: 0 % (ref 0.0–0.2)

## 2020-07-06 LAB — BASIC METABOLIC PANEL
Anion gap: 12 (ref 5–15)
BUN: 50 mg/dL — ABNORMAL HIGH (ref 8–23)
CO2: 23 mmol/L (ref 22–32)
Calcium: 8 mg/dL — ABNORMAL LOW (ref 8.9–10.3)
Chloride: 104 mmol/L (ref 98–111)
Creatinine, Ser: 5.35 mg/dL — ABNORMAL HIGH (ref 0.61–1.24)
GFR calc Af Amer: 11 mL/min — ABNORMAL LOW (ref >60)
GFR calc non Af Amer: 10 mL/min — ABNORMAL LOW (ref >60)
Glucose, Bld: 306 mg/dL — ABNORMAL HIGH (ref 70–99)
Potassium: 3.8 mmol/L (ref 3.5–5.1)
Sodium: 139 mmol/L (ref 135–145)

## 2020-07-06 LAB — URINALYSIS, ROUTINE W REFLEX MICROSCOPIC
Bacteria, UA: NONE SEEN
Bilirubin Urine: NEGATIVE
Glucose, UA: >=500 mg/dL — AB
Hgb urine dipstick: NEGATIVE
Ketones, ur: NEGATIVE mg/dL
Leukocytes,Ua: NEGATIVE
Nitrite: NEGATIVE
Protein, ur: >=300 mg/dL — AB
Specific Gravity, Urine: 1.013 (ref 1.005–1.030)
pH: 7 (ref 5.0–8.0)

## 2020-07-06 MED ORDER — IBUPROFEN 400 MG PO TABS
600.0000 mg | ORAL_TABLET | Freq: Once | ORAL | Status: AC
  Administered 2020-07-06: 600 mg via ORAL
  Filled 2020-07-06: qty 1

## 2020-07-06 MED ORDER — INSULIN ASPART 100 UNIT/ML ~~LOC~~ SOLN: 10.0000 [IU] | Freq: Once | SUBCUTANEOUS | Status: DC

## 2020-07-06 MED ORDER — ACETAMINOPHEN 325 MG PO TABS
650.0000 mg | ORAL_TABLET | Freq: Once | ORAL | Status: AC
  Administered 2020-07-06: 650 mg via ORAL
  Filled 2020-07-06: qty 2

## 2020-07-06 MED ORDER — ACETAMINOPHEN 325 MG PO TABS: 650.0000 mg | ORAL_TABLET | Freq: Four times a day (QID) | ORAL | 0 refills | Status: AC | PRN

## 2020-07-06 MED ORDER — INSULIN ASPART 100 UNIT/ML ~~LOC~~ SOLN
10.0000 [IU] | Freq: Once | SUBCUTANEOUS | Status: AC
  Administered 2020-07-06: 10 [IU] via SUBCUTANEOUS

## 2020-07-06 MED ORDER — SODIUM CHLORIDE 0.9 % IV BOLUS
1000.0000 mL | Freq: Once | INTRAVENOUS | Status: AC
  Administered 2020-07-06: 1000 mL via INTRAVENOUS

## 2020-07-06 NOTE — ED Notes (Signed)
Attempted for IV access. Pt known to have poor peripheral access. Consult placed to IV team to establish IV. MD aware of delay in medication administration.

## 2020-07-06 NOTE — ED Notes (Signed)
IV team at bedside at this time for IV placement

## 2020-07-06 NOTE — ED Provider Notes (Signed)
[facility] EMERGENCY DEPARTMENT Provider Note   CSN: [id number] Arrival date & time: 07/06/20  1153     History Chief Complaint  Patient presents with  . Fall  . Hyperglycemia    [name] [name] is a 74 y.o. male with a history of insulin-dependent diabetes, cerebral infarct, some baseline confusion, presented to emergency department with fall and right-sided rib pain.  Patient reports he had mechanical fall yesterday evening when he slipped and fell onto a hard floor, striking his right ribs on the floor.  He has been having severe pain in his right ribs since then.  It is worse with movement and inspiration.  He is concerned he may have fractured a rib.  He lives at home with his 35 year old mother, but he himself is taking care of largely by his 2 sisters to come by daily.  They administer his insulin based on his blood sugar level.  He is not sure how many units of insulin he receives.    HPI     Past Medical History:  Diagnosis Date  . 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 Closed comminuted intertrochanteric fracture of left femur ([place])   . Diabetes mellitus without complication ([place])   . Diabetic hyperosmolar non-ketotic state ([place]) 05/15/2016  . DKA (diabetic ketoacidoses) ([place]) 05/09/2016  . Hypertension   . Hypoglycemia 11/29/2019  . Postoperative anemia due to acute blood loss 06/18/2016  . Retroperitoneal hematoma 06/18/2016  . Stroke ([place])   . Vitamin B 12 deficiency 06/18/2016    Patient Active Problem List   Diagnosis Date Noted  . Thrombocytopenia ([place]) 04/23/2020  . Hypothyroidism 04/22/2020  . DKA (diabetic ketoacidoses) ([place]) 04/14/2020  . PVD (peripheral vascular disease) ([place]) 02/02/2020  . Poor social situation 12/08/2019  . CKD (chronic kidney disease) stage 4, GFR 15-29 ml/min ([name]) 12/08/2019  . Cognitive decline 11/22/2019  . ETOH abuse 05/17/2019  . Chronic diastolic CHF (congestive  heart failure) ([place]) 05/17/2019  . Uncontrolled type 2 diabetes mellitus with hyperglycemia, with long-term current use of insulin ([place]) 10/03/2018  . Underweight 11/18/2017  . Anemia 11/18/2017  . History of cerebrovascular accident (CVA) in adulthood 07/25/2017  . Depression with anxiety 07/25/2017  . Left ventricular diastolic dysfunction 83/15/1761  . Dementia ([place]) 07/25/2017  . Brittle diabetes mellitus ([place]) 06/28/2017  . Hyperlipidemia associated with type 2 diabetes mellitus ([place]) 06/27/2017  . Hypertension associated with diabetes ([place]) 06/27/2017  . Weakness 11/16/2016  . Acute renal failure superimposed on stage 4 chronic kidney disease ([place])   . Noncompliance with medication regimen 08/17/2016  . History of DVT (deep vein thrombosis) 07/06/2016  . Vitamin B 12 deficiency 06/18/2016  . Hyponatremia 05/15/2016  . Anxiety 05/15/2016  . Acute on chronic renal failure ([place]) 05/09/2016  . Narcotic dependency, continuous ([place]) 11/02/2015  . Uncontrolled type 2 diabetes mellitus with diabetic nephropathy, with long-term current use of insulin ([place]) 08/12/2015  . Anemia of chronic disease 02/02/2013    Past Surgical History:  Procedure Laterality Date  . HIP ARTHROPLASTY Right 02/05/2013   Procedure: ARTHROPLASTY BIPOLAR HIP;  Surgeon: [name], MD;  Location: [facility];  Service: Orthopedics;  Laterality: Right;  . INTRAMEDULLARY (IM) NAIL INTERTROCHANTERIC Left 06/16/2016   Procedure: INTRAMEDULLARY (IM) NAIL INTERTROCHANTRIC;  Surgeon: Rod Can, MD;  Location: [facility];  Service: Orthopedics;  Laterality: Left;       Family History  Problem Relation Age of Onset  . Alzheimer's disease Mother   .  Hypertension Mother   . Hyperlipidemia Father   . Hypertension Father   . Healthy Maternal Grandmother   . Pneumonia Maternal Grandfather     Social History   Tobacco Use  . Smoking status: Former Smoker    Packs/day: 0.25    Years: 30.00    Pack years: 7.50     Types: Cigarettes    Quit date: 02/01/2016    Years since quitting: 4.4  . Smokeless tobacco: Never Used  Vaping Use  . Vaping Use: Never used  Substance Use Topics  . Alcohol use: No  . Drug use: No    Home Medications Prior to Admission medications   Medication Sig Start Date End Date Taking? Authorizing Provider  amLODipine (NORVASC) 10 MG tablet Take 1 tablet (10 mg total) by mouth daily. 06/26/20  Yes Libby Maw, MD  aspirin EC 81 MG EC tablet Take 1 tablet (81 mg total) by mouth daily. Swallow whole. 04/28/20  Yes Barb Merino, MD  atorvastatin (LIPITOR) 40 MG tablet Take 1 tablet (40 mg total) by mouth daily. 06/26/20  Yes Libby Maw, MD  furosemide (LASIX) 20 MG tablet Take 1 tablet (20 mg total) by mouth daily. 06/26/20  Yes Libby Maw, MD  hydrALAZINE (APRESOLINE) 100 MG tablet Take 1 tablet (100 mg total) by mouth 3 (three) times daily. 04/19/20  Yes [name], MD  Insulin Glargine [facility]) 100 UNIT/ML Inject 5 Units into the skin at bedtime.  05/15/20  Yes [provider]  insulin lispro (ADMELOG SOLOSTAR) 100 UNIT/ML KwikPen Inject 0-9 Units into the skin 4 (four) times daily -  before meals and at bedtime. Sliding Scale  If 0-120= 0 units 121-150= 1 unit 151-200= 2 units 201-250= 3 units 251-300= 5 units 301-350= 7 units 351-400= 9 units Call PCP if CBG 70 or >400   Yes [provider]  levothyroxine (SYNTHROID) 25 MCG tablet Take 1 tablet (25 mcg total) by mouth daily before breakfast. 06/26/20  Yes Libby Maw, MD  sodium bicarbonate 650 MG tablet Take 1 tablet (650 mg total) by mouth 2 (two) times daily. Patient taking differently: Take 650 mg by mouth 3 (three) times daily.  04/19/20  Yes [name], MD  Vitamin D, Ergocalciferol, (DRISDOL) 1.25 MG (50000 UNIT) CAPS capsule Take 50,000 Units by mouth every 7 (seven) days. Thursdays   Yes [provider]  ACCU-CHEK GUIDE test strip USE  AS DIRECTED 06/12/20   Azzie Glatter, [name]  acetaminophen (TYLENOL) 325 MG tablet Take 2 tablets (650 mg total) by mouth every 6 (six) hours as needed for up to 30 doses for mild pain or moderate pain. 07/06/20   Wyvonnia Dusky, MD  Blood Glucose Monitoring Suppl (ACCU-CHEK GUIDE) w/Device KIT 1 Device by Does not apply route QID. Patient not taking: Reported on 01/03/2020 11/02/19   Azzie Glatter, [name]  glucose blood (ACCU-CHEK GUIDE) test strip Use as instructed 11/03/18   Azzie Glatter, [name]  glucose blood (TRUE METRIX BLOOD GLUCOSE TEST) test strip CHECK BLOOD SUGAR UP TO 4 TIMES A DAY. Patient taking differently: 1 each by Other route See admin instructions. CHECK BLOOD SUGAR UP TO 2 TIMES A DAY. 05/25/18   Azzie Glatter, [name]  insulin glargine (LANTUS) 100 UNIT/ML injection Inject 0.05 mLs (5 Units total) into the skin at bedtime. Patient not taking: Reported on 07/06/2020 04/28/20   Barb Merino, MD  Insulin Pen Needle 32G X 4 MM MISC 1 Device by  Does not apply route in the morning, at noon, in the evening, and at bedtime. 01/03/20   [name], [name], MD  Insulin Syringe-Needle U-100 (BD INSULIN SYRINGE U/F) 31G X 5/16" 0.3 ML MISC Inject 1 each as directed 2 (two) times daily. as directed 11/02/19   Azzie Glatter, [name]  vitamin B-12 1000 MCG tablet Take 1 tablet (1,000 mcg total) by mouth daily. Patient not taking: Reported on 07/06/2020 12/05/19   [name], MD    Allergies    Patient has no known allergies.  Review of Systems   Review of Systems  Unable to perform ROS: Dementia (level 5 caveat)    Physical Exam Updated Vital Signs BP (!) 179/91 (BP Location: Right Arm)   Pulse 75   Temp 98.2 F (36.8 C) (Oral)   Resp 16   Ht '5\' 9"'  (1.753 m)   Wt 66.2 kg   SpO2 100%   BMI 21.56 kg/m   Physical Exam Vitals and nursing note reviewed.  Constitutional:      Appearance: He is well-developed.  HENT:     Head: Normocephalic and atraumatic.  Eyes:      Conjunctiva/sclera: Conjunctivae normal.  Cardiovascular:     Rate and Rhythm: Normal rate and regular rhythm.     Pulses: Normal pulses.     Comments: Right chest wall lower rib tenderness to palpation, no visible contusion Pulmonary:     Effort: Pulmonary effort is normal. No respiratory distress.  Abdominal:     Palpations: Abdomen is soft.     Tenderness: There is no abdominal tenderness.  Musculoskeletal:     Cervical back: Neck supple.  Skin:    General: Skin is warm and dry.  Neurological:     Mental Status: He is alert and oriented to person, place, and time.     ED Results / Procedures / Treatments   Labs (all labs ordered are listed, but only abnormal results are displayed) Labs Reviewed  BASIC METABOLIC PANEL - Abnormal; Notable for the following components:      Result Value   Glucose, Bld 306 (*)    BUN 50 (*)    Creatinine, Ser 5.35 (*)    Calcium 8.0 (*)    GFR calc non Af Amer 10 (*)    GFR calc Af Amer 11 (*)    All other components within normal limits  CBC - Abnormal; Notable for the following components:   RBC 3.12 (*)    Hemoglobin 9.1 (*)    HCT 29.8 (*)    All other components within normal limits  URINALYSIS, ROUTINE W REFLEX MICROSCOPIC - Abnormal; Notable for the following components:   Glucose, UA >=500 (*)    Protein, ur >=300 (*)    All other components within normal limits  CBG MONITORING, ED - Abnormal; Notable for the following components:   Glucose-Capillary 284 (*)    All other components within normal limits  CBG MONITORING, ED - Abnormal; Notable for the following components:   Glucose-Capillary 271 (*)    All other components within normal limits  CBG MONITORING, ED - Abnormal; Notable for the following components:   Glucose-Capillary 253 (*)    All other components within normal limits    EKG None  Radiology DG Chest 2 View  Result Date: 07/06/2020 CLINICAL DATA:  Left posterior rib pain after fall. EXAM: CHEST - 2 VIEW  COMPARISON:  None. FINDINGS: The heart size and mediastinal contours are within normal limits. No  consolidation. Small amount of fluid in the right major fissure versus pleural thickening. No discernible pneumothorax. No evidence of acute displaced fracture. Remote right posterior sixth rib fracture with bony callus. Mild height loss of midthoracic vertebral bodies, similar to prior from January 27, 2019. Osteopenia. IMPRESSION: Small amount of fluid in the right major fissure versus pleural thickening. Otherwise, no acute cardiopulmonary disease. Electronically Signed   By: Margaretha Sheffield MD   On: 07/06/2020 12:42   CT Chest Wo Contrast  Result Date: 07/06/2020 CLINICAL DATA:  [place] yesterday, right posterior rib pain EXAM: CT CHEST WITHOUT CONTRAST TECHNIQUE: Multidetector CT imaging of the chest was performed following the standard protocol without IV contrast. Unenhanced CT was performed per clinician order. Lack of IV contrast limits sensitivity and specificity, especially for evaluation of vascular or mediastinal injury. COMPARISON:  07/06/2020 FINDINGS: Cardiovascular: Unenhanced imaging of the heart and great vessels demonstrates trace pericardial fluid. Normal caliber of the thoracic aorta. Moderate atherosclerosis of the aortic arch, with stable severe atherosclerosis of the coronary vasculature. Mediastinum/Nodes: No enlarged mediastinal or axillary lymph nodes. Thyroid gland, trachea, and esophagus demonstrate no significant findings. Lungs/Pleura: Trace right pleural effusion. No airspace disease or pneumothorax. Central airways are patent. Upper Abdomen: No acute abnormality. Musculoskeletal: There are chronic appearing compression deformities at T6, T7, T11, and T12. No acute displaced fractures. Reconstructed images demonstrate no additional findings. IMPRESSION: 1. Chronic appearing compression fractures at T6, T7, T11, and T12. Similar appearance to chest x-ray performed 01/27/2019. If further  evaluation is clinically warranted, MRI or bone scan could be performed to assess fracture acuity. 2. Trace right pleural effusion. 3. Aortic Atherosclerosis (ICD10-I70.0). Severe coronary artery atherosclerosis. Electronically Signed   By: Randa Ngo M.D.   On: 07/06/2020 20:01    Procedures Procedures (including critical care time)  Medications Ordered in ED Medications  ibuprofen (ADVIL) tablet 600 mg (600 mg Oral Given 07/06/20 1619)  acetaminophen (TYLENOL) tablet 650 mg (650 mg Oral Given 07/06/20 1619)  sodium chloride 0.9 % bolus 1,000 mL (0 mLs Intravenous Stopped 07/06/20 2008)  insulin aspart (novoLOG) injection 10 Units (10 Units Subcutaneous Given 07/06/20 1730)    ED Course  I have reviewed the triage vital signs and the nursing notes.  Pertinent labs & imaging results that were available during my care of the patient were reviewed by me and considered in my medical decision making (see chart for details).  44 male presented to emergency department with mechanical fall yesterday evening and right-sided rib pain.  This is clinically concerning for rib fracture.  No obvious fracture visualized on chest x-ray, although he has likely chronic healing fracture.  However given his degree of discomfort, will obtain a CT scan of the chest to evaluate for fracture.  We will also give him 1 L of fluid as well as 10 units of insulin for his hyperglycemia.  He has no anion gap today to suggest he is in diabetic ketosis or ketoacidosis.  No report of head trauma.  I personally reviewed the patient's labs.  BMP shows labs at baseline, with chronic kidney disease, creatinine of 5, BUN at 50, glucose of 306.  Anion gap of 12.  White blood cell count is 5.9.  Hemoglobin is 9.1 (stable from prior).  UA without ketones, but glucose and protein noted.  DG chest with chronic fx, no acute fx noted. CT pending.  Clinical Course as of Jul 08 7  Fri Jul 06, 2020  1921 Pt sleeping.  Pending CT  chest   [MT]  1938 Back from CT   [MT]  2006 IMPRESSION: 1. Chronic appearing compression fractures at T6, T7, T11, and T12. Similar appearance to chest x-ray performed 01/27/2019. If further evaluation is clinically warranted, MRI or bone scan could be performed to assess fracture acuity. 2. Trace right pleural effusion. 3. Aortic Atherosclerosis (ICD10-I70.0). Severe coronary artery atherosclerosis.   [MT]  2015 Pt pain significantly improved, he is sitting up on the side of his bed, eating and watching tv.  I conferred with his sister and stated we will discharge home with tylenol script.  Advised giving nighttime insulin and f/u with PCP on Monday.  The patient's son is coming to pick him up.     [MT]    Clinical Course User Index [MT] Alain Deschene, Carola Rhine, MD    Final Clinical Impression(s) / ED Diagnoses Final diagnoses:  Contusion of right chest wall, initial encounter    Rx / DC Orders ED Discharge Orders         Ordered    acetaminophen (TYLENOL) 325 MG tablet  Every 6 hours PRN        07/06/20 2014           Wyvonnia Dusky, MD 07/07/20 0009

## 2020-07-06 NOTE — Discharge Instructions (Signed)
You can give [name] [name] tylenol 650 mg every 6 hours for his chest pain for the next 7 days.   At home tonight, please give [name] [name] his usual evening insulin before bedtime.   Please follow up with his doctor in the office on Monday.  [name] [name]'s blood sugars continue to be high, and he needs better control of this with insulin at home.  His kidney function is also declining, and his doctor needs to follow up on this.

## 2020-07-06 NOTE — ED Notes (Signed)
Patient transported to CT 

## 2020-07-06 NOTE — ED Triage Notes (Signed)
Pt arrives via ptar after having a fall last night, reports R posterior rib pain, no obvious deformity noted by EMS. CBG 494. Pt states he took all of his meds.no LOC with fall, no blood thinners.

## 2020-07-09 ENCOUNTER — Other Ambulatory Visit: Payer: Self-pay

## 2020-07-10 ENCOUNTER — Encounter: Payer: Self-pay | Admitting: Family Medicine

## 2020-07-10 ENCOUNTER — Ambulatory Visit (INDEPENDENT_AMBULATORY_CARE_PROVIDER_SITE_OTHER): Payer: Medicare Other | Admitting: Family Medicine

## 2020-07-10 VITALS — BP 138/64 | HR 78 | Temp 98.2°F | Ht 69.0 in | Wt 149.2 lb

## 2020-07-10 DIAGNOSIS — Z9114 Patient's other noncompliance with medication regimen: Secondary | ICD-10-CM | POA: Diagnosis not present

## 2020-07-10 DIAGNOSIS — E538 Deficiency of other specified B group vitamins: Secondary | ICD-10-CM | POA: Diagnosis not present

## 2020-07-10 DIAGNOSIS — F0390 Unspecified dementia without behavioral disturbance: Secondary | ICD-10-CM | POA: Diagnosis not present

## 2020-07-10 DIAGNOSIS — N184 Chronic kidney disease, stage 4 (severe): Secondary | ICD-10-CM

## 2020-07-10 DIAGNOSIS — E1121 Type 2 diabetes mellitus with diabetic nephropathy: Secondary | ICD-10-CM | POA: Diagnosis not present

## 2020-07-10 DIAGNOSIS — E1165 Type 2 diabetes mellitus with hyperglycemia: Secondary | ICD-10-CM

## 2020-07-10 DIAGNOSIS — I10 Essential (primary) hypertension: Secondary | ICD-10-CM

## 2020-07-10 DIAGNOSIS — Z659 Problem related to unspecified psychosocial circumstances: Secondary | ICD-10-CM

## 2020-07-10 DIAGNOSIS — Z794 Long term (current) use of insulin: Secondary | ICD-10-CM

## 2020-07-10 DIAGNOSIS — IMO0002 Reserved for concepts with insufficient information to code with codable children: Secondary | ICD-10-CM

## 2020-07-10 NOTE — Progress Notes (Signed)
Established Patient Office Visit  Subjective:  Patient ID: [name] [name], male    DOB: [birth year]  Age: 74 y.o. MRN: [id number]  CC:  Chief Complaint  Patient presents with  . Follow-up    6 month follow up would like to know what he needs to do to help with swelling all over body.     HPI [name] [name] presents for follow-up after a fall at home 4 days ago.  He was seen in the emergency room and released back to his home.  He is accompanied by one of his sisters today.  He has an cognitive decline and lives at home alone with his 67 year old brother.  His sisters come by and check on him several times daily and help him with his insulin.  His sister tells me that he has follow-up with the endocrinologist next month but she is not certain when that appointment is exactly.  He has chronic kidney disease stage IV and is currently also followed by nephrology.  He was advised fluid restrict to no more than 30 ounces daily.  His diabetes has been under poor control and his mouth is dry.  It is difficult for him therefore to restrict to the 30 ounces daily.   Chart review shows multiple visits to the emergency room for diabetic ketoacidosis in the last several months Past Medical History:  Diagnosis Date  . 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 Closed comminuted intertrochanteric fracture of left femur ([place])   . Diabetes mellitus without complication ([place])   . Diabetic hyperosmolar non-ketotic state ([place]) 05/15/2016  . DKA (diabetic ketoacidoses) ([place]) 05/09/2016  . Hypertension   . Hypoglycemia 11/29/2019  . Postoperative anemia due to acute blood loss 06/18/2016  . Retroperitoneal hematoma 06/18/2016  . Stroke ([place])   . Vitamin B 12 deficiency 06/18/2016    Past Surgical History:  Procedure Laterality Date  . HIP ARTHROPLASTY Right 02/05/2013   Procedure: ARTHROPLASTY BIPOLAR HIP;  Surgeon: [name], MD;  Location: [facility];  Service:  Orthopedics;  Laterality: Right;  . INTRAMEDULLARY (IM) NAIL INTERTROCHANTERIC Left 06/16/2016   Procedure: INTRAMEDULLARY (IM) NAIL INTERTROCHANTRIC;  Surgeon: Rod Can, MD;  Location: [place];  Service: Orthopedics;  Laterality: Left;    Family History  Problem Relation Age of Onset  . Alzheimer's disease Mother   . Hypertension Mother   . Hyperlipidemia Father   . Hypertension Father   . Healthy Maternal Grandmother   . Pneumonia Maternal Grandfather     Social History   Socioeconomic History  . Marital status: Single    Spouse name: Not on file  . Number of children: 2  . Years of education: 26  . Highest education level: Not on file  Occupational History  . Occupation: Retired  Tobacco Use  . Smoking status: Former Smoker    Packs/day: 0.25    Years: 30.00    Pack years: 7.50    Types: Cigarettes    Quit date: 02/01/2016    Years since quitting: 4.4  . Smokeless tobacco: Never Used  Vaping Use  . Vaping Use: Never used  Substance and Sexual Activity  . Alcohol use: No  . Drug use: No  . Sexual activity: Not Currently  Other Topics Concern  . Not on file  Social History Narrative   Fun: Likes to do handyman related projects, photography.    Denies religious beliefs effecting health care.    Social Determinants of  Health   Financial Resource Strain:   . Difficulty of Paying Living Expenses: Not on file  Food Insecurity:   . Worried About Charity fundraiser in the Last Year: Not on file  . Ran Out of Food in the Last Year: Not on file  Transportation Needs:   . Lack of Transportation (Medical): Not on file  . Lack of Transportation (Non-Medical): Not on file  Physical Activity:   . Days of Exercise per Week: Not on file  . Minutes of Exercise per Session: Not on file  Stress:   . Feeling of Stress : Not on file  Social Connections:   . Frequency of Communication with Friends and Family: Not on file  . Frequency of Social Gatherings with Friends and  Family: Not on file  . Attends Religious Services: Not on file  . Active Member of Clubs or Organizations: Not on file  . Attends Archivist Meetings: Not on file  . Marital Status: Not on file  Intimate Partner Violence:   . Fear of Current or Ex-Partner: Not on file  . Emotionally Abused: Not on file  . Physically Abused: Not on file  . Sexually Abused: Not on file    Outpatient Medications Prior to Visit  Medication Sig Dispense Refill  . ACCU-CHEK GUIDE test strip USE AS DIRECTED 50 strip 11  . acetaminophen (TYLENOL) 325 MG tablet Take 2 tablets (650 mg total) by mouth every 6 (six) hours as needed for up to 30 doses for mild pain or moderate pain. 30 tablet 0  . amLODipine (NORVASC) 10 MG tablet Take 1 tablet (10 mg total) by mouth daily. 30 tablet 0  . aspirin EC 81 MG EC tablet Take 1 tablet (81 mg total) by mouth daily. Swallow whole. 30 tablet 11  . atorvastatin (LIPITOR) 40 MG tablet Take 1 tablet (40 mg total) by mouth daily. 30 tablet 0  . furosemide (LASIX) 20 MG tablet Take 1 tablet (20 mg total) by mouth daily. 90 tablet 1  . glucose blood (ACCU-CHEK GUIDE) test strip Use as instructed 100 each 12  . glucose blood (TRUE METRIX BLOOD GLUCOSE TEST) test strip CHECK BLOOD SUGAR UP TO 4 TIMES A DAY. (Patient taking differently: 1 each by Other route See admin instructions. CHECK BLOOD SUGAR UP TO 2 TIMES A DAY.) 300 each 0  . hydrALAZINE (APRESOLINE) 100 MG tablet Take 1 tablet (100 mg total) by mouth 3 (three) times daily. 90 tablet 3  . Insulin Glargine (BASAGLAR KWIKPEN) 100 UNIT/ML Inject 5 Units into the skin at bedtime.     . insulin glargine (LANTUS) 100 UNIT/ML injection Inject 0.05 mLs (5 Units total) into the skin at bedtime. 10 mL 0  . insulin lispro (ADMELOG SOLOSTAR) 100 UNIT/ML KwikPen Inject 0-9 Units into the skin 4 (four) times daily -  before meals and at bedtime. Sliding Scale  If 0-120= 0 units 121-150= 1 unit 151-200= 2 units 201-250= 3  units 251-300= 5 units 301-350= 7 units 351-400= 9 units Call PCP if CBG 70 or >400    . Insulin Pen Needle 32G X 4 MM MISC 1 Device by Does not apply route in the morning, at noon, in the evening, and at bedtime. 150 each 11  . Insulin Syringe-Needle U-100 (BD INSULIN SYRINGE U/F) 31G X 5/16" 0.3 ML MISC Inject 1 each as directed 2 (two) times daily. as directed 10 each 0  . levothyroxine (SYNTHROID) 25 MCG tablet Take 1 tablet (25  mcg total) by mouth daily before breakfast. 90 tablet 0  . sodium bicarbonate 650 MG tablet Take 1 tablet (650 mg total) by mouth 2 (two) times daily. (Patient taking differently: Take 650 mg by mouth 3 (three) times daily. ) 60 tablet 3  . vitamin B-12 1000 MCG tablet Take 1 tablet (1,000 mcg total) by mouth daily. 60 tablet 1  . Vitamin D, Ergocalciferol, (DRISDOL) 1.25 MG (50000 UNIT) CAPS capsule Take 50,000 Units by mouth every 7 (seven) days. Thursdays    . Blood Glucose Monitoring Suppl (ACCU-CHEK GUIDE) w/Device KIT 1 Device by Does not apply route QID. (Patient not taking: Reported on 01/03/2020) 1 kit 0   No facility-administered medications prior to visit.    No Known Allergies  ROS Review of Systems  Constitutional: Negative for diaphoresis, fatigue, fever and unexpected weight change.  HENT: Negative.   Respiratory: Negative for chest tightness and shortness of breath.   Cardiovascular: Positive for leg swelling. Negative for chest pain and palpitations.  Gastrointestinal: Negative.   Genitourinary: Negative.   Musculoskeletal: Positive for gait problem.  Skin: Negative for color change and pallor.  Neurological: Negative for speech difficulty.  Hematological: Does not bruise/bleed easily.  Psychiatric/Behavioral: Positive for confusion.      Objective:    Physical Exam Vitals and nursing note reviewed.  Constitutional:      General: He is not in acute distress.    Appearance: He is ill-appearing. He is not toxic-appearing or  diaphoretic.  HENT:     Head: Normocephalic and atraumatic.     Right Ear: External ear normal.     Left Ear: External ear normal.  Eyes:     General: No scleral icterus.       Right eye: No discharge.        Left eye: No discharge.     Conjunctiva/sclera: Conjunctivae normal.  Cardiovascular:     Rate and Rhythm: Normal rate and regular rhythm.  Pulmonary:     Effort: Pulmonary effort is normal.     Breath sounds: Normal breath sounds. No rales.  Musculoskeletal:     Cervical back: No rigidity or tenderness.     Right lower leg: Edema present.     Left lower leg: Edema present.  Lymphadenopathy:     Cervical: No cervical adenopathy.  Neurological:     Mental Status: He is alert and oriented to person, place, and time.  Psychiatric:        Mood and Affect: Mood normal.        Behavior: Behavior normal.     BP 138/64   Pulse 78   Temp 98.2 F (36.8 C) (Tympanic)   Ht '5\' 9"'  (1.753 m)   Wt 149 lb 3.2 oz (67.7 kg)   SpO2 94%   BMI 22.03 kg/m  Wt Readings from Last 3 Encounters:  07/10/20 149 lb 3.2 oz (67.7 kg)  07/06/20 146 lb (66.2 kg)  06/10/20 140 lb (63.5 kg)     Health Maintenance Due  Topic Date Due  . COVID-19 Vaccine (1) Never done  . COLONOSCOPY  Never done  . OPHTHALMOLOGY EXAM  10/14/2016  . FOOT EXAM  05/12/2018  . INFLUENZA VACCINE  05/13/2020    There are no preventive care reminders to display for this patient.  Lab Results  Component Value Date   TSH 3.203 11/23/2019   Lab Results  Component Value Date   WBC 5.9 07/06/2020   HGB 9.1 (L) 07/06/2020   HCT 29.8 (  L) 07/06/2020   MCV 95.5 07/06/2020   PLT 190 07/06/2020   Lab Results  Component Value Date   NA 139 07/06/2020   K 3.8 07/06/2020   CO2 23 07/06/2020   GLUCOSE 306 (H) 07/06/2020   BUN 50 (H) 07/06/2020   CREATININE 5.35 (H) 07/06/2020   BILITOT 0.4 06/10/2020   ALKPHOS 66 06/10/2020   AST 13 (L) 06/10/2020   ALT 10 06/10/2020   PROT 6.1 (L) 06/10/2020   ALBUMIN 3.0  (L) 06/10/2020   CALCIUM 8.0 (L) 07/06/2020   ANIONGAP 12 07/06/2020   GFR 29.15 (L) 12/08/2019   Lab Results  Component Value Date   CHOL 185 03/25/2020   Lab Results  Component Value Date   HDL 76 03/25/2020   Lab Results  Component Value Date   LDLCALC 97 03/25/2020   Lab Results  Component Value Date   TRIG 61 03/25/2020   Lab Results  Component Value Date   CHOLHDL 2.4 03/25/2020   Lab Results  Component Value Date   HGBA1C 10.3 (H) 03/24/2020      Assessment & Plan:   Problem List Items Addressed This Visit      Endocrine   Uncontrolled type 2 diabetes mellitus with diabetic nephropathy, with long-term current use of insulin ([name]) (Chronic)   Relevant Orders   Basic metabolic panel   CBC   Hemoglobin A1c     Nervous and Auditory   Dementia ([name]) - Primary     Genitourinary   CKD (chronic kidney disease) stage 4, GFR 15-29 ml/min ([name]) (Chronic)   Relevant Orders   Basic metabolic panel     Other   Vitamin B 12 deficiency   Relevant Orders   Vitamin B12   Noncompliance with medication regimen   Poor social situation    Other Visit Diagnoses    Essential hypertension          No orders of the defined types were placed in this encounter.   Follow-up: Return in about 6 months (around 01/07/2021), or if symptoms worsen or fail to improve.  I advised patient and his sister is health and overall wellbeing would likely improve in an assisted living environment.  They both are not willing to consider this at this time.  We will check his hemoglobin A1c today.  We will try to coordinate a virtual follow-up with patient in one of his sisters pending results. Libby Maw, MD

## 2020-07-10 NOTE — Addendum Note (Signed)
Addended by: Lynnea Ferrier on: 07/10/2020 01:22 PM   Modules accepted: Orders

## 2020-07-23 IMAGING — DX PORTABLE CHEST - 1 VIEW
1 series · 1 of 1 positions shown · non-contrast
Comparison: 01/27/2019

CLINICAL DATA: Diabetic, hyperglycemia.

EXAM:
PORTABLE CHEST 1 VIEW

[chest]
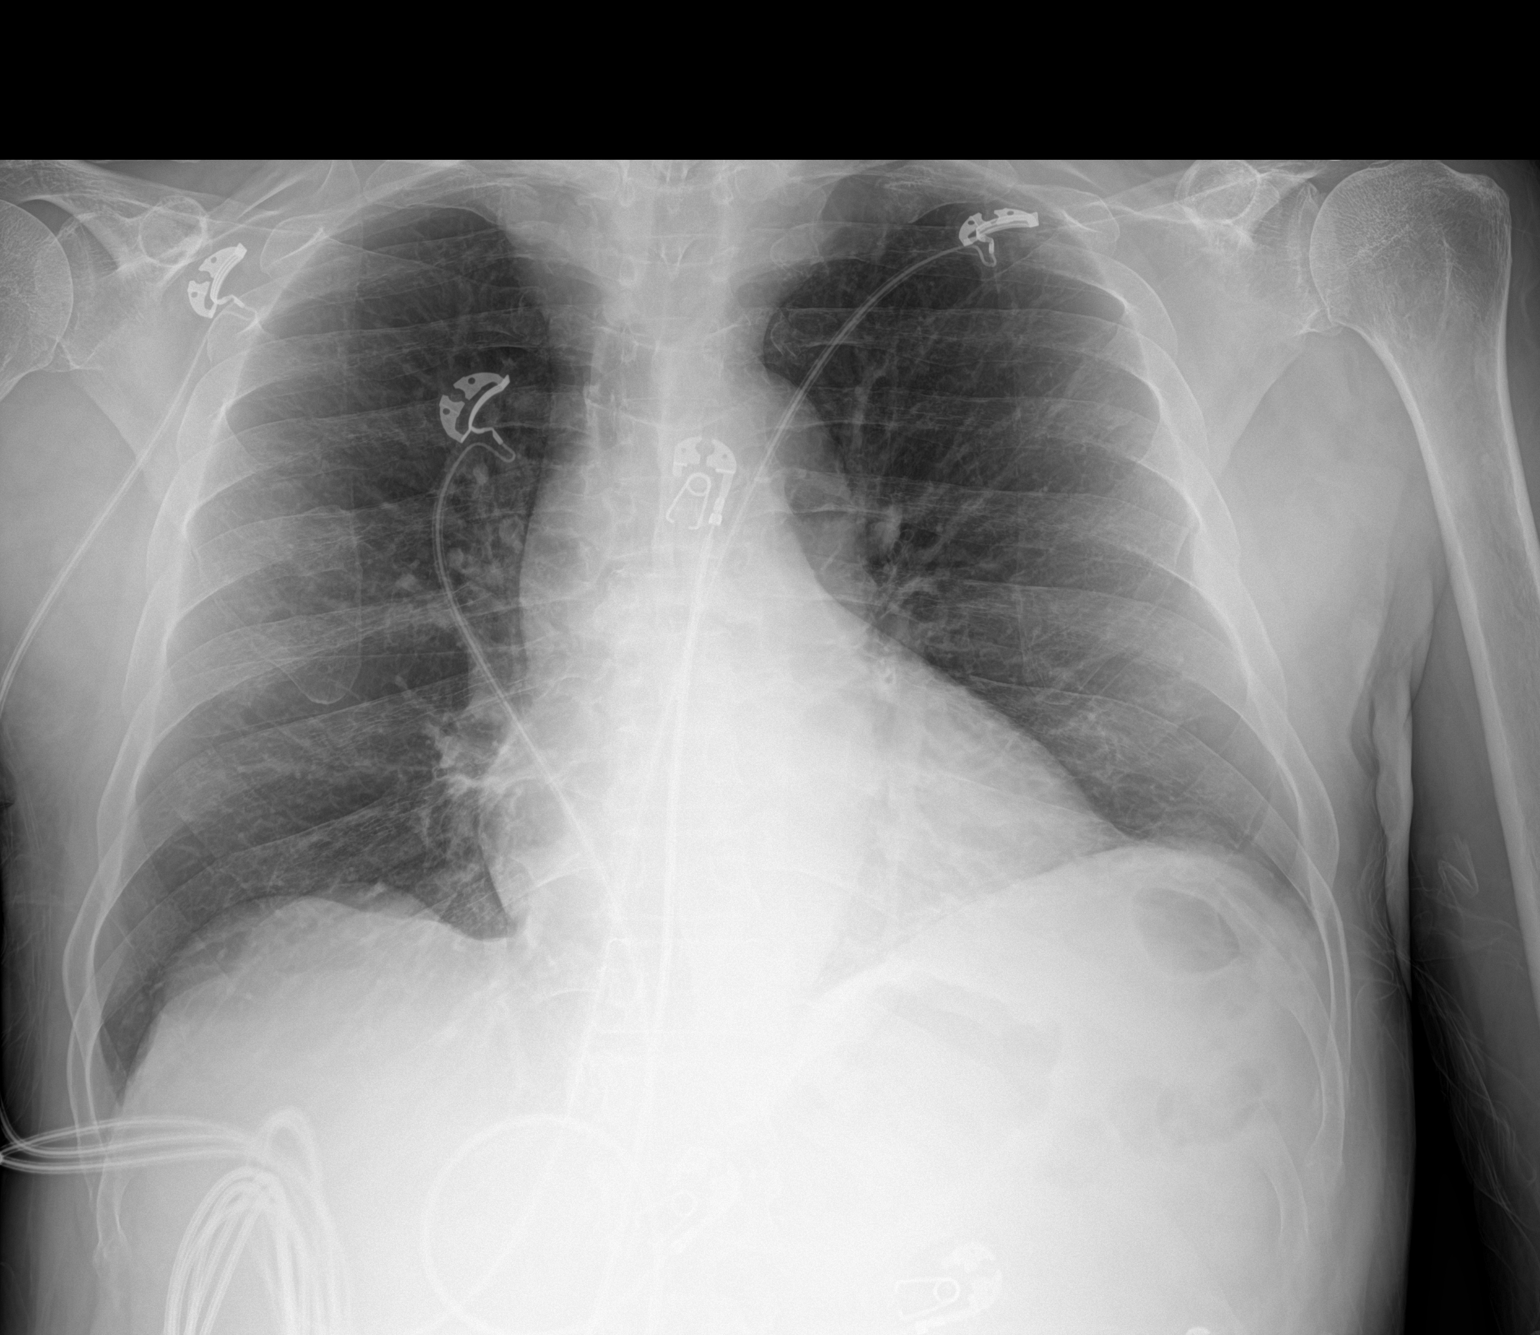

[1 of 1 positions shown; findings below may reference images not displayed]

FINDINGS: Heart and mediastinal contours are within normal limits. No focal
opacities or effusions. No acute bony abnormality.
IMPRESSION: No active disease.

## 2020-07-26 ENCOUNTER — Encounter: Payer: Self-pay | Admitting: Internal Medicine

## 2020-07-26 ENCOUNTER — Ambulatory Visit (INDEPENDENT_AMBULATORY_CARE_PROVIDER_SITE_OTHER): Payer: Medicare Other | Admitting: Internal Medicine

## 2020-07-26 ENCOUNTER — Other Ambulatory Visit: Payer: Self-pay

## 2020-07-26 VITALS — BP 160/98 | HR 89 | Ht 69.0 in | Wt 159.0 lb

## 2020-07-26 DIAGNOSIS — R739 Hyperglycemia, unspecified: Secondary | ICD-10-CM | POA: Diagnosis not present

## 2020-07-26 DIAGNOSIS — E1065 Type 1 diabetes mellitus with hyperglycemia: Secondary | ICD-10-CM

## 2020-07-26 DIAGNOSIS — E1059 Type 1 diabetes mellitus with other circulatory complications: Secondary | ICD-10-CM | POA: Diagnosis not present

## 2020-07-26 DIAGNOSIS — E1022 Type 1 diabetes mellitus with diabetic chronic kidney disease: Secondary | ICD-10-CM | POA: Diagnosis not present

## 2020-07-26 DIAGNOSIS — N184 Chronic kidney disease, stage 4 (severe): Secondary | ICD-10-CM

## 2020-07-26 LAB — POCT GLYCOSYLATED HEMOGLOBIN (HGB A1C): Hemoglobin A1C: 10 % — AB (ref 4.0–5.6)

## 2020-07-26 LAB — POCT GLUCOSE (DEVICE FOR HOME USE): Glucose Fasting, POC: 221 mg/dL — AB (ref 70–99)

## 2020-07-26 MED ORDER — BASAGLAR KWIKPEN 100 UNIT/ML ~~LOC~~ SOPN
6.0000 [IU] | PEN_INJECTOR | Freq: Every day | SUBCUTANEOUS | 11 refills | Status: DC
Start: 2020-07-26 — End: 2020-08-20

## 2020-07-26 MED ORDER — INSULIN LISPRO (1 UNIT DIAL) 100 UNIT/ML (KWIKPEN)
PEN_INJECTOR | SUBCUTANEOUS | 11 refills | Status: DC
Start: 2020-07-26 — End: 2020-08-10

## 2020-07-26 NOTE — Progress Notes (Signed)
Name: [name] [name]  Age/ Sex: 74 y.o., male   MRN/ DOB: [id number], 1946/02/03     PCP: Libby Maw, MD   Reason for Endocrinology Evaluation: Type 1 Diabetes Mellitus  Initial Endocrine Consultative Visit: 01/03/2020    PATIENT IDENTIFIER: [name] [name] is a 74 y.o. male with a past medical history of HTN, DM, CKD, depression and cognitive decline . The patient has followed with Endocrinology clinic since 01/03/2020 for consultative assistance with management of his diabetes.  DIABETIC HISTORY:  [name] [name] was diagnosed with T1DM many years ago, has been on insulin since his diagnosis. His hemoglobin A1c has ranged from 9.9% in 2018, peaking at 15.5%in 2020.  Pt lives alone but has 2 sisters that check on him, multiple ED visit for hypoglycemia.   On his initial visit to our clinic he was on lantus and insulin mix. With multiple ED visit for hypoglycemia'   SUBJECTIVE:   During the last visit (01/03/2020): A1c 10.7 % We continued lantus and provided a correction scale for humalog   Today (07/26/2020): [name] [name] is here for a follow up on diabetes management. He has not been here in 7 months. He is accompanied by his sister [name].   Per sister they check his blood sugars 4 times daily, but no meter was brought today. The patient has not had hypoglycemic episodes since the last clinic visit. Pt presented to the ED in 05/2020 with hyperglycemia   He has occasional sob as well as LE edema  No nausea or diarrhea  HOME DIABETES REGIMEN:  Lantus  12 units once daily-taking 5 units Correction scale: Humalog  ( BG-120/60) at mealtimes-using a different correction scale    Statin: Yes ACE-I/ARB: no    METER DOWNLOAD SUMMARY: This Am 299 mg/dL   Last night 250 mg/dL     DIABETIC COMPLICATIONS: Microvascular complications:   CKD IV  Denies:   Last Eye Exam: Completed   Macrovascular complications:   CVA  Denies: CAD,  PVD   HISTORY:  Past Medical History:   Past Medical History:  Diagnosis Date  . 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 Closed comminuted intertrochanteric fracture of left femur ([place])   . Diabetes mellitus without complication ([place])   . Diabetic hyperosmolar non-ketotic state ([place]) 05/15/2016  . DKA (diabetic ketoacidoses) 05/09/2016  . Hypertension   . Hypoglycemia 11/29/2019  . Postoperative anemia due to acute blood loss 06/18/2016  . Retroperitoneal hematoma 06/18/2016  . Stroke ([place])   . Vitamin B 12 deficiency 06/18/2016   Past Surgical History:  Past Surgical History:  Procedure Laterality Date  . HIP ARTHROPLASTY Right 02/05/2013   Procedure: ARTHROPLASTY BIPOLAR HIP;  Surgeon: [name], MD;  Location: [facility];  Service: Orthopedics;  Laterality: Right;  . INTRAMEDULLARY (IM) NAIL INTERTROCHANTERIC Left 06/16/2016   Procedure: INTRAMEDULLARY (IM) NAIL INTERTROCHANTRIC;  Surgeon: Rod Can, MD;  Location: [facility];  Service: Orthopedics;  Laterality: Left;    Social History:  reports that he quit smoking about 4 years ago. His smoking use included cigarettes. He has a 7.50 pack-year smoking history. He has never used smokeless tobacco. He reports that he does not drink alcohol and does not use drugs. Family History:  Family History  Problem Relation Age of Onset  . Alzheimer's disease Mother   . Hypertension Mother   . Hyperlipidemia Father   . Hypertension Father   . Healthy Maternal Grandmother   . Pneumonia Maternal  Grandfather      HOME MEDICATIONS: Allergies as of 07/26/2020   No Known Allergies     Medication List       Accurate as of July 26, 2020  9:34 AM. If you have any questions, ask your nurse or doctor.        Accu-Chek Guide w/Device Kit 1 Device by Does not apply route QID.   acetaminophen 325 MG tablet Commonly known as: Tylenol Take 2 tablets (650 mg total) by mouth every 6 (six) hours as needed for up to 30 doses for mild  pain or moderate pain.   Admelog SoloStar 100 UNIT/ML KwikPen Generic drug: insulin lispro Inject 0-9 Units into the skin 4 (four) times daily -  before meals and at bedtime. Sliding Scale  If 0-120= 0 units 121-150= 1 unit 151-200= 2 units 201-250= 3 units 251-300= 5 units 301-350= 7 units 351-400= 9 units Call PCP if CBG 70 or >400   amLODipine 10 MG tablet Commonly known as: NORVASC Take 1 tablet (10 mg total) by mouth daily.   aspirin 81 MG EC tablet Take 1 tablet (81 mg total) by mouth daily. Swallow whole.   atorvastatin 40 MG tablet Commonly known as: LIPITOR Take 1 tablet (40 mg total) by mouth daily.   cyanocobalamin 1000 MCG tablet Take 1 tablet (1,000 mcg total) by mouth daily.   furosemide 20 MG tablet Commonly known as: LASIX Take 1 tablet (20 mg total) by mouth daily.   glucose blood test strip Commonly known as: True Metrix Blood Glucose Test CHECK BLOOD SUGAR UP TO 4 TIMES A DAY. What changed:   how much to take  how to take this  when to take this  additional instructions   glucose blood test strip Commonly known as: Accu-Chek Guide Use as instructed What changed: Another medication with the same name was changed. Make sure you understand how and when to take each.   Accu-Chek Guide test strip Generic drug: glucose blood USE AS DIRECTED What changed: Another medication with the same name was changed. Make sure you understand how and when to take each.   hydrALAZINE 100 MG tablet Commonly known as: APRESOLINE Take 1 tablet (100 mg total) by mouth 3 (three) times daily.   insulin glargine 100 UNIT/ML injection Commonly known as: LANTUS Inject 0.05 mLs (5 Units total) into the skin at bedtime.   Basaglar KwikPen 100 UNIT/ML Inject 5 Units into the skin at bedtime.   Insulin Pen Needle 32G X 4 MM Misc 1 Device by Does not apply route in the morning, at noon, in the evening, and at bedtime.   Insulin Syringe-Needle U-100 31G X 5/16" 0.3  ML Misc Commonly known as: BD Insulin Syringe U/F Inject 1 each as directed 2 (two) times daily. as directed   levothyroxine 25 MCG tablet Commonly known as: SYNTHROID Take 1 tablet (25 mcg total) by mouth daily before breakfast.   sodium bicarbonate 650 MG tablet Take 1 tablet (650 mg total) by mouth 2 (two) times daily. What changed: when to take this   Vitamin D (Ergocalciferol) 1.25 MG (50000 UNIT) Caps capsule Commonly known as: DRISDOL Take 50,000 Units by mouth every 7 (seven) days. Thursdays        OBJECTIVE:   Vital Signs: BP (!) 160/98 (BP Location: Right Arm, Patient Position: Sitting, Cuff Size: Normal)   Pulse 89   Ht 5' 9" (1.753 m)   Wt 159 lb (72.1 kg)   SpO2 99%  BMI 23.48 kg/m   Wt Readings from Last 3 Encounters:  07/26/20 159 lb (72.1 kg)  07/10/20 149 lb 3.2 oz (67.7 kg)  07/06/20 146 lb (66.2 kg)     Exam: General: Pt appears well and is in NAD  Extremities: 2+ pretibial edema.   Neuro: MS is good with appropriate affect, pt is alert and Ox3       FOOT DM EXAM 07/26/2020 The skin of the feet is intact without sores or ulcerations. The pedal pulses are undetectable  The sensation is absent  to a screening 5.07, 10 gram monofilament bilaterally    DATA REVIEWED:  Lab Results  Component Value Date   HGBA1C 10.0 (A) 07/26/2020   HGBA1C 10.3 (H) 03/24/2020   HGBA1C 10.7 (H) 11/23/2019   Lab Results  Component Value Date   LDLCALC 97 03/25/2020   CREATININE 5.35 (H) 07/06/2020   No results found for: [facility]   Lab Results  Component Value Date   CHOL 185 03/25/2020   HDL 76 03/25/2020   LDLCALC 97 03/25/2020   TRIG 61 03/25/2020   CHOLHDL 2.4 03/25/2020         ASSESSMENT / PLAN / RECOMMENDATIONS:   1) Type 1 Diabetes Mellitus, Poorly controlled, With CKD IV and macrovascular complications - Most recent A1c of 10 %. Goal A1c < 8.0 %.    -Patient with difficult social determinants, as well as cognitive impairment.  -He is dependent on his sisters to provide care.  There is no glucose data available today, which she has been advised again to bring the meter in the future -Somehow he is on 5 units of basal insulin rather than the 12 units that we discussed 7 months ago.  Of note the patient has presented multiple times to the ED with hyperglycemia -Per sister she believes that the current system is what they have gotten to and is pleased with results at this time. -They do have a correction scale at home, she was unable to give me the details of the correction scale or the use at home, but it does not seem that they are using the ones that was provided by me know by the hospital.  For example this morning his BG was 299 mg/DL, for which he should have taken 2 units of prandial insulin but instead he was given 3 by the sister.   -At this point is to prevent hypoglycemia and symptomatic hypoglycemia.  Medication - Increase Basaglar to 6units daily  - Admelog correctional insulin: Use the scale below to help guide you:   If sugar   0-120= 0 units 121-150= 1 unit 151-200= 2 units 201-250= 3 units 251-300= 5 units 301-350= 7 units 351-400= 9 units 401- 450= 10 units     F/U in 6 months   Signed electronically by: Mack Guise, MD  [facility]  [facility] [address] [place], [place] [address] Phone: [phone] FAX: [phone]   CC: Libby Maw, [place] [address] Phone: [phone]  Fax: [phone]  Return to Endocrinology clinic as below: No future appointments.

## 2020-07-26 NOTE — Patient Instructions (Addendum)
-   Increase long acting insulin to 6units daily  - Admelog correctional insulin: Use the scale below to help guide you:   If sugar   0-120= 0 units 121-150= 1 unit 151-200= 2 units 201-250= 3 units 251-300= 5 units 301-350= 7 units 351-400= 9 units 401- 450= 10 units     HOW TO TREAT LOW BLOOD SUGARS (Blood sugar LESS THAN 70 MG/DL)  Please follow the RULE OF 15 for the treatment of hypoglycemia treatment (when your (blood sugars are less than 70 mg/dL)    STEP 1: Take 15 grams of carbohydrates when your blood sugar is low, which includes:   3-4 GLUCOSE TABS  OR  3-4 OZ OF JUICE OR REGULAR SODA OR  ONE TUBE OF GLUCOSE GEL     STEP 2: RECHECK blood sugar in 15 MINUTES STEP 3: If your blood sugar is still low at the 15 minute recheck --> then, go back to STEP 1 and treat AGAIN with another 15 grams of carbohydrates.

## 2020-07-27 DIAGNOSIS — E1065 Type 1 diabetes mellitus with hyperglycemia: Secondary | ICD-10-CM | POA: Insufficient documentation

## 2020-07-27 DIAGNOSIS — E109 Type 1 diabetes mellitus without complications: Secondary | ICD-10-CM | POA: Insufficient documentation

## 2020-07-27 DIAGNOSIS — E1122 Type 2 diabetes mellitus with diabetic chronic kidney disease: Secondary | ICD-10-CM | POA: Insufficient documentation

## 2020-07-27 DIAGNOSIS — N184 Chronic kidney disease, stage 4 (severe): Secondary | ICD-10-CM | POA: Insufficient documentation

## 2020-07-27 DIAGNOSIS — E1022 Type 1 diabetes mellitus with diabetic chronic kidney disease: Secondary | ICD-10-CM | POA: Insufficient documentation

## 2020-07-27 DIAGNOSIS — N185 Chronic kidney disease, stage 5: Secondary | ICD-10-CM | POA: Insufficient documentation

## 2020-07-28 ENCOUNTER — Other Ambulatory Visit: Payer: Self-pay | Admitting: Family Medicine

## 2020-07-31 ENCOUNTER — Telehealth: Payer: Self-pay

## 2020-07-31 NOTE — Telephone Encounter (Signed)
Spoke with patient's sister, [name], to follow up from message received from PCP office. [name] expressed concerns that patient is retaining fluid and would like to have NP come and check patient. Visit scheduled for 08/01/20 @ 2pm. Address confirmed. NP updated

## 2020-08-01 ENCOUNTER — Other Ambulatory Visit: Payer: Self-pay

## 2020-08-01 ENCOUNTER — Other Ambulatory Visit: Payer: Medicare Other | Admitting: Internal Medicine

## 2020-08-01 DIAGNOSIS — R609 Edema, unspecified: Secondary | ICD-10-CM

## 2020-08-01 DIAGNOSIS — Z7189 Other specified counseling: Secondary | ICD-10-CM

## 2020-08-01 DIAGNOSIS — Z515 Encounter for palliative care: Secondary | ICD-10-CM

## 2020-08-01 DIAGNOSIS — N185 Chronic kidney disease, stage 5: Secondary | ICD-10-CM

## 2020-08-09 ENCOUNTER — Telehealth: Payer: Self-pay | Admitting: Internal Medicine

## 2020-08-09 NOTE — Telephone Encounter (Signed)
Prior [name] [name] has been sent to patient's insurance. However, the insurance card that we have on file is not correct.  I had patient's sister to obtain information and she was not sure.  I had called [facility]

## 2020-08-09 NOTE — Telephone Encounter (Signed)
Pharmacy called seeing if we ever got the PA for this patient's Admelog - informed them I did not see anything in the chart and she resent it while we were on the phone and I have it in my hand, I am placing in [facility] box now - pharmacy asked if we could possibly mark this as urgent, the patient is on last vial and this is the second attempt in the pharmacy sending this fax.

## 2020-08-10 MED ORDER — INSULIN LISPRO (1 UNIT DIAL) 100 UNIT/ML (KWIKPEN): PEN_INJECTOR | SUBCUTANEOUS | 4 refills | Status: DC

## 2020-08-10 NOTE — Telephone Encounter (Addendum)
Spoken to patient's sister [name]) and notified her of patient's change in medication.   Faxed the sliding scale to pharmacy as requested.

## 2020-08-10 NOTE — Telephone Encounter (Signed)
Tried to call patient but phone kept ringing. Will try again later.

## 2020-08-10 NOTE — Telephone Encounter (Signed)
Please advise. Admelog Solostar is denied. Left fax in [facility] inbox for review for therapy.

## 2020-08-14 ENCOUNTER — Emergency Department (HOSPITAL_COMMUNITY): Payer: Medicare Other

## 2020-08-14 ENCOUNTER — Encounter (HOSPITAL_COMMUNITY): Payer: Self-pay

## 2020-08-14 ENCOUNTER — Inpatient Hospital Stay (HOSPITAL_COMMUNITY)
Admission: EM | Admit: 2020-08-14 | Discharge: 2020-08-18 | DRG: 682 | Disposition: A | Discharge status: Home / Self Care | Payer: Medicare Other | Attending: Internal Medicine | Admitting: Internal Medicine

## 2020-08-14 ENCOUNTER — Inpatient Hospital Stay (HOSPITAL_COMMUNITY): Payer: Medicare Other

## 2020-08-14 DIAGNOSIS — R739 Hyperglycemia, unspecified: Secondary | ICD-10-CM

## 2020-08-14 DIAGNOSIS — F0391 Unspecified dementia with behavioral disturbance: Secondary | ICD-10-CM | POA: Diagnosis not present

## 2020-08-14 DIAGNOSIS — Z96641 Presence of right artificial hip joint: Secondary | ICD-10-CM | POA: Diagnosis present

## 2020-08-14 DIAGNOSIS — Z820 Family history of epilepsy and other diseases of the nervous system: Secondary | ICD-10-CM

## 2020-08-14 DIAGNOSIS — Z66 Do not resuscitate: Secondary | ICD-10-CM | POA: Diagnosis present

## 2020-08-14 DIAGNOSIS — E876 Hypokalemia: Secondary | ICD-10-CM | POA: Diagnosis present

## 2020-08-14 DIAGNOSIS — E109 Type 1 diabetes mellitus without complications: Secondary | ICD-10-CM | POA: Diagnosis not present

## 2020-08-14 DIAGNOSIS — Z794 Long term (current) use of insulin: Secondary | ICD-10-CM

## 2020-08-14 DIAGNOSIS — I082 Rheumatic disorders of both aortic and tricuspid valves: Secondary | ICD-10-CM | POA: Diagnosis present

## 2020-08-14 DIAGNOSIS — Z20822 Contact with and (suspected) exposure to covid-19: Secondary | ICD-10-CM | POA: Diagnosis present

## 2020-08-14 DIAGNOSIS — I5032 Chronic diastolic (congestive) heart failure: Secondary | ICD-10-CM | POA: Diagnosis present

## 2020-08-14 DIAGNOSIS — Z87891 Personal history of nicotine dependence: Secondary | ICD-10-CM

## 2020-08-14 DIAGNOSIS — F0390 Unspecified dementia without behavioral disturbance: Secondary | ICD-10-CM | POA: Diagnosis not present

## 2020-08-14 DIAGNOSIS — E1022 Type 1 diabetes mellitus with diabetic chronic kidney disease: Secondary | ICD-10-CM | POA: Diagnosis present

## 2020-08-14 DIAGNOSIS — Z8249 Family history of ischemic heart disease and other diseases of the circulatory system: Secondary | ICD-10-CM

## 2020-08-14 DIAGNOSIS — E1059 Type 1 diabetes mellitus with other circulatory complications: Secondary | ICD-10-CM | POA: Diagnosis not present

## 2020-08-14 DIAGNOSIS — Z515 Encounter for palliative care: Secondary | ICD-10-CM | POA: Diagnosis not present

## 2020-08-14 DIAGNOSIS — Z79899 Other long term (current) drug therapy: Secondary | ICD-10-CM | POA: Diagnosis not present

## 2020-08-14 DIAGNOSIS — R0600 Dyspnea, unspecified: Secondary | ICD-10-CM | POA: Diagnosis not present

## 2020-08-14 DIAGNOSIS — Z7982 Long term (current) use of aspirin: Secondary | ICD-10-CM

## 2020-08-14 DIAGNOSIS — N185 Chronic kidney disease, stage 5: Secondary | ICD-10-CM | POA: Diagnosis present

## 2020-08-14 DIAGNOSIS — G9341 Metabolic encephalopathy: Secondary | ICD-10-CM | POA: Diagnosis present

## 2020-08-14 DIAGNOSIS — D649 Anemia, unspecified: Secondary | ICD-10-CM | POA: Diagnosis not present

## 2020-08-14 DIAGNOSIS — G9340 Encephalopathy, unspecified: Secondary | ICD-10-CM | POA: Diagnosis not present

## 2020-08-14 DIAGNOSIS — E785 Hyperlipidemia, unspecified: Secondary | ICD-10-CM | POA: Diagnosis present

## 2020-08-14 DIAGNOSIS — I132 Hypertensive heart and chronic kidney disease with heart failure and with stage 5 chronic kidney disease, or end stage renal disease: Secondary | ICD-10-CM | POA: Diagnosis present

## 2020-08-14 DIAGNOSIS — Z8673 Personal history of transient ischemic attack (TIA), and cerebral infarction without residual deficits: Secondary | ICD-10-CM | POA: Diagnosis not present

## 2020-08-14 DIAGNOSIS — E1010 Type 1 diabetes mellitus with ketoacidosis without coma: Secondary | ICD-10-CM | POA: Diagnosis present

## 2020-08-14 DIAGNOSIS — Z83438 Family history of other disorder of lipoprotein metabolism and other lipidemia: Secondary | ICD-10-CM | POA: Diagnosis not present

## 2020-08-14 DIAGNOSIS — N179 Acute kidney failure, unspecified: Principal | ICD-10-CM | POA: Diagnosis present

## 2020-08-14 DIAGNOSIS — N184 Chronic kidney disease, stage 4 (severe): Secondary | ICD-10-CM | POA: Diagnosis not present

## 2020-08-14 DIAGNOSIS — E039 Hypothyroidism, unspecified: Secondary | ICD-10-CM | POA: Diagnosis present

## 2020-08-14 DIAGNOSIS — D631 Anemia in chronic kidney disease: Secondary | ICD-10-CM | POA: Diagnosis present

## 2020-08-14 DIAGNOSIS — D638 Anemia in other chronic diseases classified elsewhere: Secondary | ICD-10-CM | POA: Diagnosis not present

## 2020-08-14 DIAGNOSIS — J690 Pneumonitis due to inhalation of food and vomit: Secondary | ICD-10-CM | POA: Diagnosis not present

## 2020-08-14 DIAGNOSIS — Z7189 Other specified counseling: Secondary | ICD-10-CM | POA: Diagnosis not present

## 2020-08-14 DIAGNOSIS — E1122 Type 2 diabetes mellitus with diabetic chronic kidney disease: Secondary | ICD-10-CM | POA: Diagnosis present

## 2020-08-14 LAB — CBC WITH DIFFERENTIAL/PLATELET
Abs Immature Granulocytes: 0.02 10*3/uL (ref 0.00–0.07)
Basophils Absolute: 0 10*3/uL (ref 0.0–0.1)
Basophils Relative: 0 %
Eosinophils Absolute: 0.1 10*3/uL (ref 0.0–0.5)
Eosinophils Relative: 2 %
HCT: 22.5 % — ABNORMAL LOW (ref 39.0–52.0)
Hemoglobin: 6.8 g/dL — CL (ref 13.0–17.0)
Immature Granulocytes: 0 %
Lymphocytes Relative: 12 %
Lymphs Abs: 0.8 10*3/uL (ref 0.7–4.0)
MCH: 29.8 pg (ref 26.0–34.0)
MCHC: 30.2 g/dL (ref 30.0–36.0)
MCV: 98.7 fL (ref 80.0–100.0)
Monocytes Absolute: 0.6 10*3/uL (ref 0.1–1.0)
Monocytes Relative: 9 %
Neutro Abs: 5.2 10*3/uL (ref 1.7–7.7)
Neutrophils Relative %: 77 %
Platelets: 260 10*3/uL (ref 150–400)
RBC: 2.28 MIL/uL — ABNORMAL LOW (ref 4.22–5.81)
RDW: 14 % (ref 11.5–15.5)
WBC: 6.7 10*3/uL (ref 4.0–10.5)
nRBC: 0 % (ref 0.0–0.2)

## 2020-08-14 LAB — COMPREHENSIVE METABOLIC PANEL
ALT: 14 U/L (ref 0–44)
AST: 24 U/L (ref 15–41)
Albumin: 2.3 g/dL — ABNORMAL LOW (ref 3.5–5.0)
Alkaline Phosphatase: 53 U/L (ref 38–126)
Anion gap: 19 — ABNORMAL HIGH (ref 5–15)
BUN: 69 mg/dL — ABNORMAL HIGH (ref 8–23)
CO2: 18 mmol/L — ABNORMAL LOW (ref 22–32)
Calcium: 7.5 mg/dL — ABNORMAL LOW (ref 8.9–10.3)
Chloride: 101 mmol/L (ref 98–111)
Creatinine, Ser: 7.66 mg/dL — ABNORMAL HIGH (ref 0.61–1.24)
GFR, Estimated: 7 mL/min — ABNORMAL LOW (ref >60)
Glucose, Bld: 566 mg/dL (ref 70–99)
Potassium: 3.2 mmol/L — ABNORMAL LOW (ref 3.5–5.1)
Sodium: 138 mmol/L (ref 135–145)
Total Bilirubin: 1 mg/dL (ref 0.3–1.2)
Total Protein: 5.5 g/dL — ABNORMAL LOW (ref 6.5–8.1)

## 2020-08-14 LAB — CBG MONITORING, ED
Glucose-Capillary: 487 mg/dL — ABNORMAL HIGH (ref 70–99)
Glucose-Capillary: 389 mg/dL — ABNORMAL HIGH (ref 70–99)

## 2020-08-14 LAB — I-STAT VENOUS BLOOD GAS, ED
Acid-base deficit: 8 mmol/L — ABNORMAL HIGH (ref 0.0–2.0)
Bicarbonate: 16 mmol/L — ABNORMAL LOW (ref 20.0–28.0)
Calcium, Ion: 0.89 mmol/L — CL (ref 1.15–1.40)
HCT: 22 % — ABNORMAL LOW (ref 39.0–52.0)
Hemoglobin: 7.5 g/dL — ABNORMAL LOW (ref 13.0–17.0)
O2 Saturation: 93 %
Potassium: 3 mmol/L — ABNORMAL LOW (ref 3.5–5.1)
Sodium: 137 mmol/L (ref 135–145)
TCO2: 17 mmol/L — ABNORMAL LOW (ref 22–32)
pCO2, Ven: 25.7 mmHg — ABNORMAL LOW (ref 44.0–60.0)
pH, Ven: 7.403 (ref 7.250–7.430)
pO2, Ven: 67 mmHg — ABNORMAL HIGH (ref 32.0–45.0)

## 2020-08-14 LAB — BETA-HYDROXYBUTYRIC ACID: Beta-Hydroxybutyric Acid: 1.44 mmol/L — ABNORMAL HIGH (ref 0.05–0.27)

## 2020-08-14 LAB — RESPIRATORY PANEL BY RT PCR (FLU A&B, COVID)
Influenza A by PCR: NEGATIVE
Influenza B by PCR: NEGATIVE
SARS Coronavirus 2 by RT PCR: NEGATIVE

## 2020-08-14 LAB — RETICULOCYTES
Immature Retic Fract: 6.7 % (ref 2.3–15.9)
RBC.: 2.35 MIL/uL — ABNORMAL LOW (ref 4.22–5.81)
Retic Count, Absolute: 22.1 10*3/uL (ref 19.0–186.0)
Retic Ct Pct: 0.9 % (ref 0.4–3.1)

## 2020-08-14 LAB — AMMONIA: Ammonia: 26 umol/L (ref 9–35)

## 2020-08-14 LAB — ETHANOL: Alcohol, Ethyl (B): <10 mg/dL (ref <10)

## 2020-08-14 LAB — LACTIC ACID, PLASMA: Lactic Acid, Venous: 1.5 mmol/L (ref 0.5–1.9)

## 2020-08-14 LAB — TROPONIN I (HIGH SENSITIVITY)
Troponin I (High Sensitivity): 42 ng/L — ABNORMAL HIGH (ref <18)
Troponin I (High Sensitivity): 85 ng/L — ABNORMAL HIGH (ref <18)

## 2020-08-14 LAB — BRAIN NATRIURETIC PEPTIDE: B Natriuretic Peptide: 178.6 pg/mL — ABNORMAL HIGH (ref 0.0–100.0)

## 2020-08-14 LAB — PREPARE RBC (CROSSMATCH)

## 2020-08-14 LAB — MAGNESIUM: Magnesium: 2.2 mg/dL (ref 1.7–2.4)

## 2020-08-14 MED ORDER — ACETAMINOPHEN 650 MG RE SUPP: 650.0000 mg | Freq: Four times a day (QID) | RECTAL | Status: DC | PRN

## 2020-08-14 MED ORDER — INSULIN ASPART 100 UNIT/ML ~~LOC~~ SOLN: 0.0000 [IU] | Freq: Three times a day (TID) | SUBCUTANEOUS | Status: DC

## 2020-08-14 MED ORDER — INSULIN ASPART 100 UNIT/ML ~~LOC~~ SOLN
10.0000 [IU] | Freq: Once | SUBCUTANEOUS | Status: AC
  Administered 2020-08-14: 10 [IU] via INTRAVENOUS

## 2020-08-14 MED ORDER — ACETAMINOPHEN 325 MG PO TABS: 650.0000 mg | ORAL_TABLET | Freq: Four times a day (QID) | ORAL | Status: DC | PRN

## 2020-08-14 MED ORDER — SODIUM CHLORIDE 0.9% IV SOLUTION: Freq: Once | INTRAVENOUS | Status: AC

## 2020-08-14 MED ORDER — SODIUM CHLORIDE 0.9 % IV SOLN: INTRAVENOUS | Status: DC

## 2020-08-14 MED ORDER — POTASSIUM CHLORIDE CRYS ER 20 MEQ PO TBCR
40.0000 meq | EXTENDED_RELEASE_TABLET | ORAL | Status: DC
  Administered 2020-08-14: 40 meq via ORAL
  Filled 2020-08-14: qty 2

## 2020-08-14 MED ORDER — INSULIN ASPART 100 UNIT/ML ~~LOC~~ SOLN
0.0000 [IU] | Freq: Every day | SUBCUTANEOUS | Status: DC
  Administered 2020-08-14: 5 [IU] via SUBCUTANEOUS

## 2020-08-14 NOTE — ED Notes (Signed)
IV team at bedside at this time. 

## 2020-08-14 NOTE — ED Notes (Signed)
Provider called and made aware of CBG, verbal order received to transfuse 1 unit and recollect CBC until morning for reassessment.

## 2020-08-14 NOTE — ED Provider Notes (Signed)
[facility] EMERGENCY DEPARTMENT Provider Note   CSN: [id number] Arrival date & time: 08/14/20  6314     History Chief Complaint  Patient presents with  . Altered Mental Status    Mohsen Odenthal is a 74 y.o. male.  74y/o male with hx of cognitive impairment post CVA, CKD stage III, DM T2, HTN, anemia, CHF who presents today from home with altered mental status.  Patient gives some history but his sister gives most of the history.  She reports that earlier today he was his normal self.  However she reports that in the last several weeks he has had worsening exertional dyspnea to where now he is extremely out of breath even after going to the bathroom and has to sit down and rest.  He has had worsening swelling in his legs but she is unaware if he has gained weight.  She reports that he is pretty good about taking his medication because she administers it to him however he is not good about following a diabetic diet.  He also drinks plenty of water because he is always thirsty because his sugar is high.  She reports that he no longer drinks any alcohol does not smoke and does not use any type of pain medication or other medications that would make him drowsy.  Today he went to the grocery store with a friend who reported that he walked around the grocery store the whole time and when he got back to the house she reports he sat down at the table and ate a few bites of donut and she was ready to make him some soup and he said he was getting have it later and went and laid down.  When she walked by his room she noticed him laid out on the bed she tried to wake him up and he would not wake up.  He was breathing but because he was not able to be aroused she called paramedics.  When they arrived blood sugar was in the 500s patient was groggy but he would answer questions.  He was brought to the hospital for further evaluation.  Sister reports he has not had his Lasix today because she was  getting ready to give it to him right before the incident occurred.  He has not had any recent falls and no recent medication changes.  The history is provided by the patient and a relative.  Altered Mental Status      Past Medical History:  Diagnosis Date  . 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 Closed comminuted intertrochanteric fracture of left femur ([place])   . Diabetes mellitus without complication ([place])   . Diabetic hyperosmolar non-ketotic state ([place]) 05/15/2016  . DKA (diabetic ketoacidoses) 05/09/2016  . Hypertension   . Hypoglycemia 11/29/2019  . Postoperative anemia due to acute blood loss 06/18/2016  . Retroperitoneal hematoma 06/18/2016  . Stroke ([place])   . Vitamin B 12 deficiency 06/18/2016    Patient Active Problem List   Diagnosis Date Noted  . Type 1 diabetes mellitus with hyperglycemia ([place]) 07/27/2020  . 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 Diabetes mellitus type I ([place]) 07/27/2020  . Thrombocytopenia ([place]) 04/23/2020  . Hypothyroidism 04/22/2020  . DKA (diabetic ketoacidoses) 04/14/2020  . PVD (peripheral vascular disease) ([place]) 02/02/2020  . Poor social situation 12/08/2019  . CKD (chronic kidney disease) stage 4, GFR 15-29 ml/min ([name]) 12/08/2019  .  Cognitive decline 11/22/2019  . ETOH abuse 05/17/2019  . Chronic diastolic CHF (congestive heart failure) ([place]) 05/17/2019  . Uncontrolled type 2 diabetes mellitus with hyperglycemia, with long-term current use of insulin ([place]) 10/03/2018  . Underweight 11/18/2017  . Anemia 11/18/2017  . History of cerebrovascular accident (CVA) in adulthood 07/25/2017  . Depression with anxiety 07/25/2017  . Left ventricular diastolic dysfunction 50/93/2671  . Dementia ([place]) 07/25/2017  . Brittle diabetes mellitus ([place]) 06/28/2017  . Hyperlipidemia associated with type 2 diabetes mellitus ([place]) 06/27/2017  . Hypertension associated  with diabetes ([place]) 06/27/2017  . Weakness 11/16/2016  . Acute renal failure superimposed on stage 4 chronic kidney disease ([place])   . Noncompliance with medication regimen 08/17/2016  . History of DVT (deep vein thrombosis) 07/06/2016  . Vitamin B 12 deficiency 06/18/2016  . Hyponatremia 05/15/2016  . Anxiety 05/15/2016  . Acute on chronic renal failure ([place]) 05/09/2016  . Narcotic dependency, continuous ([place]) 11/02/2015  . Uncontrolled type 2 diabetes mellitus with diabetic nephropathy, with long-term current use of insulin ([place]) 08/12/2015  . Anemia of chronic disease 02/02/2013    Past Surgical History:  Procedure Laterality Date  . HIP ARTHROPLASTY Right 02/05/2013   Procedure: ARTHROPLASTY BIPOLAR HIP;  Surgeon: [name], MD;  Location: [facility];  Service: Orthopedics;  Laterality: Right;  . INTRAMEDULLARY (IM) NAIL INTERTROCHANTERIC Left 06/16/2016   Procedure: INTRAMEDULLARY (IM) NAIL INTERTROCHANTRIC;  Surgeon: Rod Can, MD;  Location: [facility];  Service: Orthopedics;  Laterality: Left;       Family History  Problem Relation Age of Onset  . Alzheimer's disease Mother   . Hypertension Mother   . Hyperlipidemia Father   . Hypertension Father   . Healthy Maternal Grandmother   . Pneumonia Maternal Grandfather     Social History   Tobacco Use  . Smoking status: Former Smoker    Packs/day: 0.25    Years: 30.00    Pack years: 7.50    Types: Cigarettes    Quit date: 02/01/2016    Years since quitting: 4.5  . Smokeless tobacco: Never Used  Vaping Use  . Vaping Use: Never used  Substance Use Topics  . Alcohol use: No  . Drug use: No    Home Medications Prior to Admission medications   Medication Sig Start Date End Date Taking? Authorizing Provider  ACCU-CHEK GUIDE test strip USE AS DIRECTED 06/12/20   Azzie Glatter, [name]  acetaminophen (TYLENOL) 325 MG tablet Take 2 tablets (650 mg total) by mouth every 6 (six) hours as needed for up to 30 doses for mild  pain or moderate pain. 07/06/20   Wyvonnia Dusky, MD  amLODipine (NORVASC) 10 MG tablet Take 1 tablet (10 mg total) by mouth daily. 06/26/20   Libby Maw, MD  aspirin EC 81 MG EC tablet Take 1 tablet (81 mg total) by mouth daily. Swallow whole. 04/28/20   Barb Merino, MD  atorvastatin (LIPITOR) 40 MG tablet Take 1 tablet (40 mg total) by mouth daily. 06/26/20   Libby Maw, MD  Blood Glucose Monitoring Suppl (ACCU-CHEK GUIDE) w/Device KIT 1 Device by Does not apply route QID. 11/02/19   Azzie Glatter, [name]  furosemide (LASIX) 20 MG tablet Take 1 tablet (20 mg total) by mouth daily. 06/26/20   Libby Maw, MD  glucose blood (ACCU-CHEK GUIDE) test strip Use as instructed 11/03/18   Azzie Glatter, [name]  glucose blood (TRUE METRIX BLOOD GLUCOSE TEST) test strip CHECK BLOOD SUGAR UP  TO 4 TIMES A DAY. Patient taking differently: 1 each by Other route See admin instructions. CHECK BLOOD SUGAR UP TO 2 TIMES A DAY. 05/25/18   Azzie Glatter, [name]  hydrALAZINE (APRESOLINE) 100 MG tablet TAKE 1 TABLET BY MOUTH 3 TIMES A DAY. 07/30/20   Libby Maw, MD  Insulin Glargine [facility]) 100 UNIT/ML Inject 6 Units into the skin daily. 07/26/20   [name], [name], MD  insulin lispro (HUMALOG KWIKPEN) 100 UNIT/ML KwikPen Max daily 30 units 08/10/20   [name], [name], MD  Insulin Pen Needle 32G X 4 MM MISC 1 Device by Does not apply route in the morning, at noon, in the evening, and at bedtime. 01/03/20   [name], [name], MD  Insulin Syringe-Needle U-100 (BD INSULIN SYRINGE U/F) 31G X 5/16" 0.3 ML MISC Inject 1 each as directed 2 (two) times daily. as directed 11/02/19   Azzie Glatter, [name]  levothyroxine (SYNTHROID) 25 MCG tablet Take 1 tablet (25 mcg total) by mouth daily before breakfast. 06/26/20   Libby Maw, MD  sodium bicarbonate 650 MG tablet Take 1 tablet (650 mg total) by mouth 2 (two) times daily. Patient  taking differently: Take 650 mg by mouth 3 (three) times daily.  04/19/20   [name], MD  vitamin B-12 1000 MCG tablet Take 1 tablet (1,000 mcg total) by mouth daily. 12/05/19   [name], MD  Vitamin D, Ergocalciferol, (DRISDOL) 1.25 MG (50000 UNIT) CAPS capsule Take 50,000 Units by mouth every 7 (seven) days. Thursdays    [provider]    Allergies    Patient has no known allergies.  Review of Systems   Review of Systems  All other systems reviewed and are negative.   Physical Exam Updated Vital Signs BP (!) 98/49   Pulse 86   Temp 98.1 F (36.7 C) (Oral)   Resp 18   Ht _0  (1.753 m)   Wt 66.7 kg   SpO2 96%   BMI 21.71 kg/m   Physical Exam Vitals and nursing note reviewed.  Constitutional:      General: He is sleeping. He is not in acute distress.    Appearance: He is well-developed.     Comments: Pt wakes up and answers questions and then drifts back to sleep  HENT:     Head: Normocephalic and atraumatic.     Mouth/Throat:     Mouth: Mucous membranes are moist.  Eyes:     Conjunctiva/sclera: Conjunctivae normal.     Comments: 2 mm and sluggishly reactive  Neck:     Comments: jvd present Cardiovascular:     Rate and Rhythm: Normal rate and regular rhythm.     Heart sounds: Murmur heard.  Systolic murmur is present with a grade of 2/6.   Pulmonary:     Effort: Pulmonary effort is normal. No respiratory distress.     Breath sounds: Normal breath sounds. No wheezing or rales.  Abdominal:     General: There is no distension.     Palpations: Abdomen is soft.     Tenderness: There is no abdominal tenderness. There is no guarding or rebound.  Musculoskeletal:        General: No tenderness. Normal range of motion.     Cervical back: Normal range of motion and neck supple.     Right lower leg: Edema present.     Left lower leg: Edema present.     Comments: 3+ pitting edema to bilateral  lower ext to the knee.  Mild swelling noted to the hands.     Skin:    General: Skin is warm and dry.     Findings: No erythema or rash.  Neurological:     Mental Status: He is oriented to person, place, and time.     Comments: No pronator drift present in bilateral upper extremities.  Patient will move his lower legs but becomes drowsy and falls asleep before he can complete commands.  Psychiatric:        Behavior: Behavior is cooperative.     Comments: Sleepy but cooperative. Seems to answer questions appropriately     ED Results / Procedures / Treatments   Labs (all labs ordered are listed, but only abnormal results are displayed) Labs Reviewed  CBC WITH DIFFERENTIAL/PLATELET - Abnormal; Notable for the following components:      Result Value   RBC 2.28 (*)    Hemoglobin 6.8 (*)    HCT 22.5 (*)    All other components within normal limits  COMPREHENSIVE METABOLIC PANEL - Abnormal; Notable for the following components:   Potassium 3.2 (*)    CO2 18 (*)    Glucose, Bld 566 (*)    BUN 69 (*)    Creatinine, Ser 7.66 (*)    Calcium 7.5 (*)    Total Protein 5.5 (*)    Albumin 2.3 (*)    GFR, Estimated 7 (*)    Anion gap 19 (*)    All other components within normal limits  BRAIN NATRIURETIC PEPTIDE - Abnormal; Notable for the following components:   B Natriuretic Peptide 178.6 (*)    All other components within normal limits  CBG MONITORING, ED - Abnormal; Notable for the following components:   Glucose-Capillary 487 (*)    All other components within normal limits  I-STAT VENOUS BLOOD GAS, ED - Abnormal; Notable for the following components:   pCO2, Ven 25.7 (*)    pO2, Ven 67.0 (*)    Bicarbonate 16.0 (*)    TCO2 17 (*)    Acid-base deficit 8.0 (*)    Potassium 3.0 (*)    Calcium, Ion 0.89 (*)    HCT 22.0 (*)    Hemoglobin 7.5 (*)    All other components within normal limits  TROPONIN I (HIGH SENSITIVITY) - Abnormal; Notable for the following components:   Troponin I (High Sensitivity) 42 (*)    All other components within  normal limits  ETHANOL  AMMONIA  RAPID URINE DRUG SCREEN, HOSP PERFORMED  TYPE AND SCREEN  PREPARE RBC (CROSSMATCH)  TROPONIN I (HIGH SENSITIVITY)    EKG EKG Interpretation  Date/Time:  Tuesday August 14 2020 16:48:23 EDT Ventricular Rate:  89 PR Interval:    QRS Duration: 99 QT Interval:  452 QTC Calculation: 551 R Axis:   -49 Text Interpretation: Sinus rhythm Prolonged PR interval LAD, consider left anterior fascicular block Abnormal R-wave progression, early transition Borderline T abnormalities, lateral leads new Prolonged QT interval Confirmed by Blanchie Dessert [phone]) on 08/14/2020 5:24:58 PM   Radiology CT HEAD WO CONTRAST  Result Date: 08/14/2020 CLINICAL DATA:  Altered mental status. EXAM: CT HEAD WITHOUT CONTRAST TECHNIQUE: Contiguous axial images were obtained from the base of the skull through the vertex without intravenous contrast. COMPARISON:  Head CT 04/14/2020 FINDINGS: Brain: Again seen generalized atrophy and moderate chronic small vessel ischemia. Small chronic lacunar infarct in the subcortical white matter of the posterior right frontal lobe. No intracranial hemorrhage, mass effect, or  midline shift. No hydrocephalus. The basilar cisterns are patent. No evidence of territorial infarct or acute ischemia. No extra-axial or intracranial fluid collection. Vascular: Atherosclerosis of skullbase vasculature without hyperdense vessel or abnormal calcification. Skull: No fracture or focal lesion. Sinuses/Orbits: There is mucosal thickening involving ethmoid air cells, maxillary sinuses, and sphenoid sinuses. Fluid levels involving the left greater than right sphenoid sinus. Mastoid air cells are clear. Other: None. IMPRESSION: 1. No acute intracranial abnormality. 2. Stable atrophy and chronic small vessel ischemia. 3. Paranasal sinus disease with fluid levels in the sphenoid sinus, may represent acute sinusitis. Electronically Signed   By: Keith Rake M.D.   On:  08/14/2020 18:39   DG Chest Port 1 View  Result Date: 08/14/2020 CLINICAL DATA:  Dyspnea on exertion EXAM: PORTABLE CHEST 1 VIEW COMPARISON:  07/06/2020 FINDINGS: The heart size and mediastinal contours are within normal limits. Persistent mild elevation of the left hemidiaphragm. No pleural effusion. The visualized skeletal structures are unremarkable. IMPRESSION: No acute process in the chest. Electronically Signed   By: Macy Mis M.D.   On: 08/14/2020 17:58    Procedures Procedures (including critical care time)  Medications Ordered in ED Medications - No data to display  ED Course  I have reviewed the triage vital signs and the nursing notes.  Pertinent labs & imaging results that were available during my care of the patient were reviewed by me and considered in my medical decision making (see chart for details).    MDM Rules/Calculators/A&P                          Patient is a 74 year old male presenting today with altered mental status.  Most of the history was provided by his sister.  Patient has had symptoms concerning for CHF exacerbation over the last few weeks he has had worsening DOE and worsening swelling in his lower extremities despite using his Lasix.  However patient does not restrict fluid and because he does not follow diabetic diet he is always thirsty.  He denies any pain and will arouse and answer questions appropriately but is still very somnolent here.  Pupils are approximately 2 mm and sluggishly reactive.  Patient has no medication on his medical list that would cause drowsiness or altered mental status and this was confirmed by sister.  She reports he no longer drinks alcohol and does not use any drugs or tobacco.  He has no abdominal pain and breath sounds are clear on exam.  Blood sugar here is 498 but would not be the explanation for his altered mental status.  He has no focal findings concerning for stroke at this time.  Concern for possible metabolic  encephalopathy versus ingestion.  Labs and imaging are pending.  7:23 PM VBG without evidence of respiratory acidosis and CO2 is normal, BNP mildly elevated at 178, CBC with new anemia with a hemoglobin of 6.8 with his baseline typically 9.  CMP with a an anion gap of 19 most likely related to acidosis from chronic kidney disease.  Creatinine has worsened and is now 7.66 from his baseline of 4 and 5.  BUN of 69 and hypocalcemia of 7.5.  Blood sugar is 566 and patient was given IV insulin.  Alcohol, ammonia are negative, head CT without acute findings, chest x-ray is clear.  Discussed with patient and he does wish to have a blood transfusion.  This may be what is contributing to his significant shortness of breath.  Patient will be transfused 2 units.  Will call for admission.  Also with AKI on top of CKD.  MDM Number of Diagnoses or Management Options   Amount and/or Complexity of Data Reviewed Clinical lab tests: reviewed and ordered Tests in the radiology section of CPT: ordered and reviewed Tests in the medicine section of CPT: ordered and reviewed Decide to obtain previous medical records or to obtain history from someone other than the patient: yes Obtain history from someone other than the patient: yes Review and summarize past medical records: yes Discuss the patient with other providers: yes Independent visualization of images, tracings, or specimens: yes  Risk of Complications, Morbidity, and/or Mortality Presenting problems: high Diagnostic procedures: moderate Management options: moderate  Patient Progress Patient progress: improved  CRITICAL CARE Performed by: Ree Alcalde Total critical care time: 30 minutes Critical care time was exclusive of separately billable procedures and treating other patients. Critical care was necessary to treat or prevent imminent or life-threatening deterioration. Critical care was time spent personally by me on the following activities:  development of treatment plan with patient and/or surrogate as well as nursing, discussions with consultants, evaluation of patient's response to treatment, examination of patient, obtaining history from patient or surrogate, ordering and performing treatments and interventions, ordering and review of laboratory studies, ordering and review of radiographic studies, pulse oximetry and re-evaluation of patient's condition.   Final Clinical Impression(s) / ED Diagnoses Final diagnoses:  Symptomatic anemia  AKI (acute kidney injury) [facility])    Rx / DC Orders ED Discharge Orders    None       Blanchie Dessert, MD 08/14/20 1927

## 2020-08-14 NOTE — ED Notes (Signed)
Blood consent signed in chart.

## 2020-08-14 NOTE — ED Triage Notes (Signed)
Pt arrived via [facility] from home for AMSx2 hrs. Per EMS pt's sister told them prior to pt becoming AMS pt ate a lot of donuts. Pt is a diabetic. Per EMS pt was lethargic and only responded to pain at the seen, but GCS got better in route. Per ems pt has generalized edema.Pt is NSR on monitor, VSS.

## 2020-08-14 NOTE — ED Notes (Signed)
Pt sister provided update.

## 2020-08-14 NOTE — H&P (Addendum)
History and Physical    [name] [name] MNO:[id number] DOB: [birth year] DOA: 08/14/2020  PCP: Libby Maw, MD Patient coming from: Home  Chief Complaint: Altered mental status  HPI: [name] [name] is a 74 y.o. male with medical history significant of brittle and uncontrolled insulin-dependent diabetes, CKD stage IV, chronic diastolic CHF, history of CVA with cognitive dysfunction, hypertension, hyperlipidemia, hypothyroidism presenting to the ED via EMS for evaluation of altered mental status.  Per EMS, patient sister told them that prior to patient becoming altered he ate a lot of donuts.  When EMS arrived his CBG was in the 500s.  Patient was groggy but able to answer questions.  He was brought to the hospital for further evaluation.  Family reported patient having dyspnea on exertion and lower extremity edema despite taking his Lasix. History provided by patient limited as he appears confused. Reports having intermittent shortness of breath, unclear for how long and better it is exertional or at rest. He has no other complaints. Denies fevers, rhinorrhea, nasal congestion, sinus pain/pressure, cough, chest pain, nausea, vomiting, abdominal pain, diarrhea, or dysuria.  ED Course: Afebrile.  Not tachycardic.  Hypotensive with systolic in the 03Y to 33X.  Not tachypneic or hypoxic.  WBC 6.7, hemoglobin 6.8, hematocrit 22.5, platelet 260k.  Sodium 138, potassium 3.2, chloride 101, bicarb 18, anion gap 19, BUN 69, creatinine 7.6, glucose 566, corrected calcium 8.9, LFTs normal.  Blood ethanol level undetectable.  Ammonia level normal.  UDS pending.  Initial high-sensitivity troponin mildly elevated at 42, repeat pending.  EKG without acute ischemic changes.  BNP 178.  VBG with pH 7.40.  Screening SARS-CoV-2 PCR test and influenza panel pending.  Chest x-ray showing no acute process.  Head CT negative for acute intracranial abnormality. Patient received NovoLog 10 units.  2 units PRBCs ordered.  Review  of Systems:  All systems reviewed and apart from history of presenting illness, are negative.  Past Medical History:  Diagnosis Date  . 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 Closed comminuted intertrochanteric fracture of left femur ([place])   . Diabetes mellitus without complication ([place])   . Diabetic hyperosmolar non-ketotic state ([place]) 05/15/2016  . DKA (diabetic ketoacidoses) 05/09/2016  . Hypertension   . Hypoglycemia 11/29/2019  . Postoperative anemia due to acute blood loss 06/18/2016  . Retroperitoneal hematoma 06/18/2016  . Stroke ([place])   . Vitamin B 12 deficiency 06/18/2016    Past Surgical History:  Procedure Laterality Date  . HIP ARTHROPLASTY Right 02/05/2013   Procedure: ARTHROPLASTY BIPOLAR HIP;  Surgeon: [name], MD;  Location: [facility];  Service: Orthopedics;  Laterality: Right;  . INTRAMEDULLARY (IM) NAIL INTERTROCHANTERIC Left 06/16/2016   Procedure: INTRAMEDULLARY (IM) NAIL INTERTROCHANTRIC;  Surgeon: Rod Can, MD;  Location: [facility];  Service: Orthopedics;  Laterality: Left;     reports that he quit smoking about 4 years ago. His smoking use included cigarettes. He has a 7.50 pack-year smoking history. He has never used smokeless tobacco. He reports that he does not drink alcohol and does not use drugs.  No Known Allergies  Family History  Problem Relation Age of Onset  . Alzheimer's disease Mother   . Hypertension Mother   . Hyperlipidemia Father   . Hypertension Father   . Healthy Maternal Grandmother   . Pneumonia Maternal Grandfather     Prior to Admission medications   Medication Sig Start Date End Date Taking? Authorizing Provider  ACCU-CHEK GUIDE test strip USE  AS DIRECTED 06/12/20   Azzie Glatter, [name]  acetaminophen (TYLENOL) 325 MG tablet Take 2 tablets (650 mg total) by mouth every 6 (six) hours as needed for up to 30 doses for mild pain or moderate pain. 07/06/20   Wyvonnia Dusky, MD   amLODipine (NORVASC) 10 MG tablet Take 1 tablet (10 mg total) by mouth daily. 06/26/20   Libby Maw, MD  aspirin EC 81 MG EC tablet Take 1 tablet (81 mg total) by mouth daily. Swallow whole. 04/28/20   Barb Merino, MD  atorvastatin (LIPITOR) 40 MG tablet Take 1 tablet (40 mg total) by mouth daily. 06/26/20   Libby Maw, MD  Blood Glucose Monitoring Suppl (ACCU-CHEK GUIDE) w/Device KIT 1 Device by Does not apply route QID. 11/02/19   Azzie Glatter, [name]  furosemide (LASIX) 20 MG tablet Take 1 tablet (20 mg total) by mouth daily. 06/26/20   Libby Maw, MD  glucose blood (ACCU-CHEK GUIDE) test strip Use as instructed 11/03/18   Azzie Glatter, [name]  glucose blood (TRUE METRIX BLOOD GLUCOSE TEST) test strip CHECK BLOOD SUGAR UP TO 4 TIMES A DAY. Patient taking differently: 1 each by Other route See admin instructions. CHECK BLOOD SUGAR UP TO 2 TIMES A DAY. 05/25/18   Azzie Glatter, [name]  hydrALAZINE (APRESOLINE) 100 MG tablet TAKE 1 TABLET BY MOUTH 3 TIMES A DAY. 07/30/20   Libby Maw, MD  Insulin Glargine [facility]) 100 UNIT/ML Inject 6 Units into the skin daily. 07/26/20   [name], [name], MD  insulin lispro (HUMALOG KWIKPEN) 100 UNIT/ML KwikPen Max daily 30 units 08/10/20   [name], [name], MD  Insulin Pen Needle 32G X 4 MM MISC 1 Device by Does not apply route in the morning, at noon, in the evening, and at bedtime. 01/03/20   [name], [name], MD  Insulin Syringe-Needle U-100 (BD INSULIN SYRINGE U/F) 31G X 5/16" 0.3 ML MISC Inject 1 each as directed 2 (two) times daily. as directed 11/02/19   Azzie Glatter, [name]  levothyroxine (SYNTHROID) 25 MCG tablet Take 1 tablet (25 mcg total) by mouth daily before breakfast. 06/26/20   Libby Maw, MD  sodium bicarbonate 650 MG tablet Take 1 tablet (650 mg total) by mouth 2 (two) times daily. Patient taking differently: Take 650 mg by mouth 3 (three) times  daily.  04/19/20   [name], MD  vitamin B-12 1000 MCG tablet Take 1 tablet (1,000 mcg total) by mouth daily. 12/05/19   [name], MD  Vitamin D, Ergocalciferol, (DRISDOL) 1.25 MG (50000 UNIT) CAPS capsule Take 50,000 Units by mouth every 7 (seven) days. Thursdays    [provider]    Physical Exam: Vitals:   08/14/20 2015 08/14/20 2100 08/14/20 2145 08/14/20 2202  BP: 106/61 93/62 125/70 118/60  Pulse: 82 81 94 79  Resp: (!) 7 20 (!) 23 13  Temp:   97.7 F (36.5 C) 97.7 F (36.5 C)  TempSrc:   Oral Oral  SpO2: 98% 96%  93%  Weight:      Height:        Physical Exam Constitutional:      General: He is not in acute distress. HENT:     Head: Normocephalic and atraumatic.     Mouth/Throat:     Mouth: Mucous membranes are dry.     Pharynx: Oropharynx is clear.  Eyes:     Extraocular Movements: Extraocular movements intact.  Conjunctiva/sclera: Conjunctivae normal.  Cardiovascular:     Rate and Rhythm: Normal rate and regular rhythm.     Pulses: Normal pulses.  Pulmonary:     Effort: Pulmonary effort is normal. No respiratory distress.     Breath sounds: Normal breath sounds. No wheezing or rales.  Abdominal:     General: Bowel sounds are normal.     Palpations: Abdomen is soft.     Tenderness: There is no abdominal tenderness. There is no guarding.  Musculoskeletal:        General: No swelling or tenderness.     Cervical back: Normal range of motion and neck supple.     Right lower leg: Edema present.     Left lower leg: Edema present.     Comments: +3 pitting edema of bilateral lower extremities  Skin:    General: Skin is warm and dry.  Neurological:     Mental Status: He is alert.     Comments: Oriented to person place only     Labs on Admission: I have personally reviewed following labs and imaging studies  CBC: Recent Labs  Lab 08/14/20 1712 08/14/20 1853  WBC 6.7  --   NEUTROABS 5.2  --   HGB 6.8* 7.5*  HCT 22.5* 22.0*  MCV 98.7   --   PLT 260  --    Basic Metabolic Panel: Recent Labs  Lab 08/14/20 1712 08/14/20 1853  NA 138 137  K 3.2* 3.0*  CL 101  --   CO2 18*  --   GLUCOSE 566*  --   BUN 69*  --   CREATININE 7.66*  --   CALCIUM 7.5*  --    GFR: Estimated Creatinine Clearance: 7.9 mL/min (A) (by C-G formula based on SCr of 7.66 mg/dL (H)). Liver Function Tests: Recent Labs  Lab 08/14/20 1712  AST 24  ALT 14  ALKPHOS 53  BILITOT 1.0  PROT 5.5*  ALBUMIN 2.3*   No results for input(s): LIPASE, AMYLASE in the last 168 hours. Recent Labs  Lab 08/14/20 1712  AMMONIA 26   Coagulation Profile: No results for input(s): INR, PROTIME in the last 168 hours. Cardiac Enzymes: No results for input(s): CKTOTAL, CKMB, CKMBINDEX, TROPONINI in the last 168 hours. BNP (last 3 results) No results for input(s): PROBNP in the last 8760 hours. HbA1C: No results for input(s): HGBA1C in the last 72 hours. CBG: Recent Labs  Lab 08/14/20 1646 08/14/20 2141  GLUCAP 487* 389*   Lipid Profile: No results for input(s): CHOL, HDL, LDLCALC, TRIG, CHOLHDL, LDLDIRECT in the last 72 hours. Thyroid Function Tests: No results for input(s): TSH, T4TOTAL, FREET4, T3FREE, THYROIDAB in the last 72 hours. Anemia Panel: No results for input(s): VITAMINB12, FOLATE, FERRITIN, TIBC, IRON, RETICCTPCT in the last 72 hours. Urine analysis:    Component Value Date/Time   COLORURINE YELLOW 07/06/2020 1629   APPEARANCEUR CLEAR 07/06/2020 1629   LABSPEC 1.013 07/06/2020 1629   PHURINE 7.0 07/06/2020 1629   GLUCOSEU >=500 (A) 07/06/2020 1629   GLUCOSEU >=1000 (A) 12/08/2019 1139   HGBUR NEGATIVE 07/06/2020 1629   BILIRUBINUR NEGATIVE 07/06/2020 1629   BILIRUBINUR Negative 11/09/2019 0910   KETONESUR NEGATIVE 07/06/2020 1629   PROTEINUR >=300 (A) 07/06/2020 1629   UROBILINOGEN 0.2 12/08/2019 1139   NITRITE NEGATIVE 07/06/2020 1629   LEUKOCYTESUR NEGATIVE 07/06/2020 1629    Radiological Exams on Admission: CT HEAD WO  CONTRAST  Result Date: 08/14/2020 CLINICAL DATA:  Altered mental status. EXAM: CT HEAD WITHOUT CONTRAST  TECHNIQUE: Contiguous axial images were obtained from the base of the skull through the vertex without intravenous contrast. COMPARISON:  Head CT 04/14/2020 FINDINGS: Brain: Again seen generalized atrophy and moderate chronic small vessel ischemia. Small chronic lacunar infarct in the subcortical white matter of the posterior right frontal lobe. No intracranial hemorrhage, mass effect, or midline shift. No hydrocephalus. The basilar cisterns are patent. No evidence of territorial infarct or acute ischemia. No extra-axial or intracranial fluid collection. Vascular: Atherosclerosis of skullbase vasculature without hyperdense vessel or abnormal calcification. Skull: No fracture or focal lesion. Sinuses/Orbits: There is mucosal thickening involving ethmoid air cells, maxillary sinuses, and sphenoid sinuses. Fluid levels involving the left greater than right sphenoid sinus. Mastoid air cells are clear. Other: None. IMPRESSION: 1. No acute intracranial abnormality. 2. Stable atrophy and chronic small vessel ischemia. 3. Paranasal sinus disease with fluid levels in the sphenoid sinus, may represent acute sinusitis. Electronically Signed   By: Keith Rake M.D.   On: 08/14/2020 18:39   DG Chest Port 1 View  Result Date: 08/14/2020 CLINICAL DATA:  Dyspnea on exertion EXAM: PORTABLE CHEST 1 VIEW COMPARISON:  07/06/2020 FINDINGS: The heart size and mediastinal contours are within normal limits. Persistent mild elevation of the left hemidiaphragm. No pleural effusion. The visualized skeletal structures are unremarkable. IMPRESSION: No acute process in the chest. Electronically Signed   By: Macy Mis M.D.   On: 08/14/2020 17:58    EKG: Independently reviewed.  Sinus rhythm, QT interval increased since prior tracing (QTC 551).  Assessment/Plan Principal Problem:   Acute renal failure superimposed on stage  4 chronic kidney disease ([name]) Active Problems:   Hyperglycemia   Acute metabolic encephalopathy   Anemia   Diabetes mellitus type I ([place])   AKI on CKD stage IV Possibly prerenal from dehydration and home diuretic use. Has dry mucous membranes on exam and blood pressure low. BUN 69, creatinine 7.6. Baseline creatinine is in the 3-4 range. -Does have significant peripheral edema but lungs clear. Give gentle IV fluid hydration. Monitor renal function and urine output. Avoid nephrotoxic agents and contrast. Hold home Lasix. Order renal ultrasound. Check urine sodium and creatinine.  Hyperglycemia Uncontrolled insulin-dependent diabetes Suspect due to medication noncompliance and dietary indiscretion. Family reported to EMS that patient consumed a lot of donuts before they arrived.  A1c 10.0 on labs done 2 weeks ago.  Blood glucose 566. Bicarb 18, anion gap 19. VBG with pH 7.40.?Early DKA. Slight metabolic acidosis could also be due to AKI on CKD stage IV. Patient received NovoLog 10 units in the ED. Repeat CBG improved to 389. -Check beta hydroxybutyric acid level and A1c. Order SSI-M ACHS and CBG checks.  Addendum: Patient appears to be in mild DKA.  Beta hydroxybutyric acid came back slightly elevated at 1.44.  Will keep n.p.o. and start IV insulin and fluids per DKA protocol.  When CBG less than 250, switch to D5-LR infusion.  Monitor BMP every 4 hours.  Monitor beta hydroxybutyric acid level every 8 hours.  Potassium supplementation for hypokalemia.  Slight hypotension Systolic currently in the 90s. Suspect due to volume depletion in the setting of home diuretic use. No infectious signs or symptoms. Afebrile and labs showing no leukocytosis. Not tachycardic.  Chest x-ray not suggestive of pneumonia. -IV fluid hydration, hold Lasix.  Check UA to rule out UTI.  Check lactic acid level.  Acute on chronic symptomatic anemia Hemoglobin 6.8 on initial labs and 7.5 on repeat. MCV 98.7. Hemoglobin  previously in the 8-9  range. Suspect worsening anemia could be contributing to his dyspnea on exertion. -2 units PRBCs were ordered in the ED. Will change order to 1 unit at this time and repeat H&H in the morning. No signs of active bleeding. Order anemia panel and check FOBT.  Dyspnea on exertion Family reported patient having dyspnea on exertion. He does have significant lower extremity edema but no signs of pulmonary edema on chest x-ray. No signs of respiratory distress and lungs clear on exam. Not tachypneic or hypoxic. Suspect dyspnea on exertion is due to anemia. -Continue management of anemia as mentioned above  Acute metabolic encephalopathy Head CT negative for acute intracranial abnormality. No infectious signs or symptoms. Blood ethanol level undetectable.  Ammonia level normal.  Encephalopathy likely due to AKI and hyperglycemia.  Currently oriented to person and place but appears confused. -UDS pending.  UA pending to rule out UTI.  Mild hypokalemia Likely due to home diuretic use. Potassium 3.2. -Replete potassium. Check magnesium level and replete if low. Continue to monitor electrolytes.  Elevated troponin High-sensitivity troponin mildly elevated at 42.  ACS less likely as EKG without acute ischemic changes and patient denies chest pain. -Cardiac monitoring, trend troponin  QT prolongation on EKG -Cardiac monitoring.  Keep potassium above 4 and magnesium above 2.  Avoid QT prolonging drugs if possible.  Repeat EKG in a.m.  Pharmacy med rec pending.  DVT prophylaxis: SCDs at this time, FOBT pending Code Status: DNR per documentation from prior hospitalization in July 2021. Family Communication: No family available at this time. Disposition Plan: Status is: Inpatient  Remains inpatient appropriate because:Altered mental status, IV treatments appropriate due to intensity of illness or inability to take PO and Inpatient level of care appropriate due to severity of  illness   Dispo: The patient is from: Home              Anticipated d/c is to: SNF              Anticipated d/c date is: 3 days              Patient currently is not medically stable to d/c.  The medical decision making on this patient was of high complexity and the patient is at high risk for clinical deterioration, therefore this is a level 3 visit.  Shela Leff MD [facility]  If 7PM-7AM, please contact night-coverage [email or web address]  08/14/2020, 10:15 PM

## 2020-08-14 NOTE — ED Notes (Signed)
Patient transported to CT 

## 2020-08-14 NOTE — ED Notes (Signed)
Placed pt on 2L 02 per [place], Sa02 88% on RA

## 2020-08-15 ENCOUNTER — Other Ambulatory Visit: Payer: Self-pay

## 2020-08-15 ENCOUNTER — Encounter (HOSPITAL_COMMUNITY): Payer: Self-pay | Admitting: Internal Medicine

## 2020-08-15 ENCOUNTER — Inpatient Hospital Stay (HOSPITAL_COMMUNITY): Payer: Medicare Other

## 2020-08-15 DIAGNOSIS — R0600 Dyspnea, unspecified: Secondary | ICD-10-CM

## 2020-08-15 DIAGNOSIS — N179 Acute kidney failure, unspecified: Secondary | ICD-10-CM | POA: Diagnosis present

## 2020-08-15 LAB — CBG MONITORING, ED
Glucose-Capillary: 296 mg/dL — ABNORMAL HIGH (ref 70–99)
Glucose-Capillary: 183 mg/dL — ABNORMAL HIGH (ref 70–99)
Glucose-Capillary: 107 mg/dL — ABNORMAL HIGH (ref 70–99)
Glucose-Capillary: 86 mg/dL (ref 70–99)
Glucose-Capillary: 70 mg/dL (ref 70–99)
Glucose-Capillary: 62 mg/dL — ABNORMAL LOW (ref 70–99)
Glucose-Capillary: 96 mg/dL (ref 70–99)
Glucose-Capillary: 129 mg/dL — ABNORMAL HIGH (ref 70–99)

## 2020-08-15 LAB — ECHOCARDIOGRAM COMPLETE
AR max vel: 1.41 cm2
AV Area VTI: 1.31 cm2
AV Area mean vel: 1.34 cm2
AV Mean grad: 9 mmHg
AV Peak grad: 14.4 mmHg
Ao pk vel: 1.9 m/s
Area-P 1/2: 2.95 cm2
Height: 69 in
S' Lateral: 3.3 cm
Weight: 2352 oz

## 2020-08-15 LAB — RAPID URINE DRUG SCREEN, HOSP PERFORMED
Amphetamines: NOT DETECTED
Barbiturates: NOT DETECTED
Benzodiazepines: NOT DETECTED
Cocaine: NOT DETECTED
Opiates: NOT DETECTED
Tetrahydrocannabinol: NOT DETECTED

## 2020-08-15 LAB — BASIC METABOLIC PANEL
Anion gap: 14 (ref 5–15)
BUN: 69 mg/dL — ABNORMAL HIGH (ref 8–23)
CO2: 23 mmol/L (ref 22–32)
Calcium: 7.6 mg/dL — ABNORMAL LOW (ref 8.9–10.3)
Chloride: 104 mmol/L (ref 98–111)
Creatinine, Ser: 7.08 mg/dL — ABNORMAL HIGH (ref 0.61–1.24)
GFR, Estimated: 7 mL/min — ABNORMAL LOW (ref >60)
Glucose, Bld: 128 mg/dL — ABNORMAL HIGH (ref 70–99)
Potassium: 3.2 mmol/L — ABNORMAL LOW (ref 3.5–5.1)
Sodium: 141 mmol/L (ref 135–145)

## 2020-08-15 LAB — FERRITIN: Ferritin: 189 ng/mL (ref 24–336)

## 2020-08-15 LAB — BETA-HYDROXYBUTYRIC ACID: Beta-Hydroxybutyric Acid: 0.06 mmol/L (ref 0.05–0.27)

## 2020-08-15 LAB — URINALYSIS, ROUTINE W REFLEX MICROSCOPIC
Bacteria, UA: NONE SEEN
Bilirubin Urine: NEGATIVE
Glucose, UA: >=500 mg/dL — AB
Hgb urine dipstick: NEGATIVE
Ketones, ur: 5 mg/dL — AB
Leukocytes,Ua: NEGATIVE
Nitrite: NEGATIVE
Protein, ur: >=300 mg/dL — AB
Specific Gravity, Urine: 1.014 (ref 1.005–1.030)
pH: 5 (ref 5.0–8.0)

## 2020-08-15 LAB — TROPONIN I (HIGH SENSITIVITY): Troponin I (High Sensitivity): 138 ng/L (ref <18)

## 2020-08-15 LAB — FOLATE: Folate: 27.6 ng/mL (ref >5.9)

## 2020-08-15 LAB — HEMOGLOBIN A1C
Hgb A1c MFr Bld: 10.7 % — ABNORMAL HIGH (ref 4.8–5.6)
Mean Plasma Glucose: 260.39 mg/dL

## 2020-08-15 LAB — IRON AND TIBC
Iron: 60 ug/dL (ref 45–182)
Saturation Ratios: 23 % (ref 17.9–39.5)
TIBC: 262 ug/dL (ref 250–450)
UIBC: 202 ug/dL

## 2020-08-15 LAB — VITAMIN B12: Vitamin B-12: 1117 pg/mL — ABNORMAL HIGH (ref 180–914)

## 2020-08-15 LAB — HEMOGLOBIN AND HEMATOCRIT, BLOOD
HCT: 21.5 % — ABNORMAL LOW (ref 39.0–52.0)
Hemoglobin: 6.8 g/dL — CL (ref 13.0–17.0)

## 2020-08-15 LAB — GLUCOSE, CAPILLARY: Glucose-Capillary: 105 mg/dL — ABNORMAL HIGH (ref 70–99)

## 2020-08-15 MED ORDER — SODIUM BICARBONATE 650 MG PO TABS
650.0000 mg | ORAL_TABLET | Freq: Two times a day (BID) | ORAL | Status: DC
  Administered 2020-08-15 – 2020-08-18 (×7): 650 mg via ORAL
  Filled 2020-08-15 (×8): qty 1

## 2020-08-15 MED ORDER — LEVOTHYROXINE SODIUM 25 MCG PO TABS
25.0000 ug | ORAL_TABLET | Freq: Every day | ORAL | Status: DC
  Administered 2020-08-16 – 2020-08-18 (×3): 25 ug via ORAL
  Filled 2020-08-15 (×4): qty 1

## 2020-08-15 MED ORDER — INSULIN REGULAR(HUMAN) IN NACL 100-0.9 UT/100ML-% IV SOLN
INTRAVENOUS | Status: DC
  Administered 2020-08-15: 8 [IU]/h via INTRAVENOUS
  Filled 2020-08-15: qty 100

## 2020-08-15 MED ORDER — POTASSIUM CHLORIDE 10 MEQ/100ML IV SOLN
10.0000 meq | INTRAVENOUS | Status: AC
  Administered 2020-08-15 (×3): 10 meq via INTRAVENOUS
  Filled 2020-08-15 (×3): qty 100

## 2020-08-15 MED ORDER — ATORVASTATIN CALCIUM 40 MG PO TABS
40.0000 mg | ORAL_TABLET | Freq: Every day | ORAL | Status: DC
  Administered 2020-08-15 – 2020-08-18 (×4): 40 mg via ORAL
  Filled 2020-08-15 (×4): qty 1

## 2020-08-15 MED ORDER — SODIUM CHLORIDE 0.9 % IV SOLN: INTRAVENOUS | Status: DC

## 2020-08-15 MED ORDER — VITAMIN B-12 1000 MCG PO TABS
1000.0000 ug | ORAL_TABLET | Freq: Every day | ORAL | Status: DC
  Administered 2020-08-15 – 2020-08-18 (×4): 1000 ug via ORAL
  Filled 2020-08-15 (×4): qty 1

## 2020-08-15 MED ORDER — INSULIN GLARGINE 100 UNIT/ML ~~LOC~~ SOLN
15.0000 [IU] | Freq: Every day | SUBCUTANEOUS | Status: DC
  Administered 2020-08-15 – 2020-08-18 (×4): 15 [IU] via SUBCUTANEOUS
  Filled 2020-08-15 (×4): qty 0.15

## 2020-08-15 MED ORDER — INSULIN ASPART 100 UNIT/ML ~~LOC~~ SOLN
0.0000 [IU] | Freq: Three times a day (TID) | SUBCUTANEOUS | Status: DC
  Administered 2020-08-15: 2 [IU] via SUBCUTANEOUS
  Administered 2020-08-16 (×2): 5 [IU] via SUBCUTANEOUS
  Administered 2020-08-17 (×2): 3 [IU] via SUBCUTANEOUS
  Administered 2020-08-17 – 2020-08-18 (×2): 2 [IU] via SUBCUTANEOUS
  Administered 2020-08-18: 3 [IU] via SUBCUTANEOUS

## 2020-08-15 MED ORDER — DEXTROSE IN LACTATED RINGERS 5 % IV SOLN: INTRAVENOUS | Status: DC

## 2020-08-15 MED ORDER — AMLODIPINE BESYLATE 10 MG PO TABS
10.0000 mg | ORAL_TABLET | Freq: Every day | ORAL | Status: DC
  Administered 2020-08-15 – 2020-08-18 (×4): 10 mg via ORAL
  Filled 2020-08-15: qty 2
  Filled 2020-08-15 (×3): qty 1

## 2020-08-15 MED ORDER — ASPIRIN EC 81 MG PO TBEC
81.0000 mg | DELAYED_RELEASE_TABLET | Freq: Every day | ORAL | Status: DC
  Administered 2020-08-15 – 2020-08-18 (×4): 81 mg via ORAL
  Filled 2020-08-15 (×4): qty 1

## 2020-08-15 MED ORDER — LACTATED RINGERS IV SOLN: INTRAVENOUS | Status: DC

## 2020-08-15 MED ORDER — POTASSIUM CHLORIDE 10 MEQ/100ML IV SOLN
10.0000 meq | INTRAVENOUS | Status: AC
  Administered 2020-08-15: 10 meq via INTRAVENOUS
  Filled 2020-08-15: qty 100

## 2020-08-15 MED ORDER — DEXTROSE 50 % IV SOLN: 0.0000 mL | INTRAVENOUS | Status: DC | PRN

## 2020-08-15 MED ORDER — INSULIN ASPART 100 UNIT/ML ~~LOC~~ SOLN: 0.0000 [IU] | Freq: Every day | SUBCUTANEOUS | Status: DC

## 2020-08-15 MED ORDER — INSULIN ASPART 100 UNIT/ML ~~LOC~~ SOLN
3.0000 [IU] | Freq: Three times a day (TID) | SUBCUTANEOUS | Status: DC
  Administered 2020-08-15 – 2020-08-16 (×3): 3 [IU] via SUBCUTANEOUS

## 2020-08-15 NOTE — ED Notes (Addendum)
Multiple attempts to for peripheral IV without success.

## 2020-08-15 NOTE — Progress Notes (Signed)
Inpatient Diabetes Program Recommendations  AACE/ADA: New Consensus Statement on Inpatient Glycemic Control (2015)  Target Ranges:  Prepandial:   less than 140 mg/dL      Peak postprandial:   less than 180 mg/dL (1-2 hours)      Critically ill patients:  140 - 180 mg/dL   Lab Results  Component Value Date   GLUCAP 70 08/15/2020   HGBA1C 10.0 (A) 07/26/2020    Review of Glycemic Control  Diabetes history: DM1 Outpatient Diabetes medications: Basaglar 5 units + Humalog 0-10 units tid meal coverage Current orders for Inpatient glycemic control: Lantus 15 units  Inpatient Diabetes Program Recommendations:    Patient well known to inpatient diabetes team. Patient received Lantus 15 this am. Watch for hypoglycemia. -Decrease Lantus to 5 units -Add Novolog 0-6 units tid correction -Novolog meal coverage 2 units tid if eats 50%  Note sent RN Nolberto Hanlon regarding above information.  Thank you, Nani Gasser. Jayshon Dommer, RN, MSN, CDE  Diabetes Coordinator Inpatient Glycemic Control Team Team Pager [phone] (8am-5pm) 08/15/2020 10:39 AM

## 2020-08-15 NOTE — ED Notes (Addendum)
Hospitalist made aware of peripheral infiltration of blood product.

## 2020-08-15 NOTE — ED Notes (Signed)
Pt fed a full meal will recheck his CBG afterwards

## 2020-08-15 NOTE — ED Notes (Addendum)
Spoke with Pharmacist regarding infiltration, provider paged, awaiting return call to notify.

## 2020-08-15 NOTE — Progress Notes (Signed)
PROGRESS NOTE    [name] [name]  MVE:[id number] DOB: 16-Sep-1946 DOA: 08/14/2020 PCP: Libby Maw, MD   Brief Narrative:  HPI: [name] [name] is a 74 y.o. male with medical history significant of brittle and uncontrolled insulin-dependent diabetes, CKD stage IV, chronic diastolic CHF, history of CVA with cognitive dysfunction, hypertension, hyperlipidemia, hypothyroidism presenting to the ED via EMS for evaluation of altered mental status.  Per EMS, patient sister told them that prior to patient becoming altered he ate a lot of donuts.  When EMS arrived his CBG was in the 500s.  Patient was groggy but able to answer questions.  He was brought to the hospital for further evaluation.  Family reported patient having dyspnea on exertion and lower extremity edema despite taking his Lasix. History provided by patient limited as he appears confused. Reports having intermittent shortness of breath, unclear for how long and better it is exertional or at rest. He has no other complaints. Denies fevers, rhinorrhea, nasal congestion, sinus pain/pressure, cough, chest pain, nausea, vomiting, abdominal pain, diarrhea, or dysuria.  ED Course: Afebrile.  Not tachycardic.  Hypotensive with systolic in the 28Z to 66Q.  Not tachypneic or hypoxic.  WBC 6.7, hemoglobin 6.8, hematocrit 22.5, platelet 260k.  Sodium 138, potassium 3.2, chloride 101, bicarb 18, anion gap 19, BUN 69, creatinine 7.6, glucose 566, corrected calcium 8.9, LFTs normal.  Blood ethanol level undetectable.  Ammonia level normal.  UDS pending.  Initial high-sensitivity troponin mildly elevated at 42, repeat pending.  EKG without acute ischemic changes.  BNP 178.  VBG with pH 7.40.  Screening SARS-CoV-2 PCR test and influenza panel pending.  Chest x-ray showing no acute process.  Head CT negative for acute intracranial abnormality. Patient received NovoLog 10 units.  2 units PRBCs ordered.  Assessment & Plan:   Principal Problem:   Acute renal  failure superimposed on stage 4 chronic kidney disease ([name]) Active Problems:   Hyperglycemia   Acute metabolic encephalopathy   Anemia   Diabetes mellitus type I ([place])   AKI (acute kidney injury) ([place])   Acute metabolic encephalopathy:  Head CT negative for acute intracranial abnormality. No infectious signs or symptoms. Blood ethanol level undetectable.  Ammonia level normal.  His encephalopathy was likely due to being in DKA.  With improvement in DKA, encephalopathy has improved.  He is alert, oriented to person, place and current POTUS as well.  AKI on CKD stage V: His last known creatinine was 5.35 in September this year and he presented with 7.66.  Creatinine has improved slightly.  This was likely due to dehydration due to being in DKA.  We will continue him on gentle IV hydration.  Repeat labs in the morning.  CO2 normal.  Continue to hold nephrotoxic agents.  DKA in a patient with uncontrolled insulin-dependent type 2 diabetes mellitus: Patient was admitted with DKA.  He was started on insulin drip as well as IV fluids per protocol.  His DKA has resolved.  For some reason, his insulin was stopped this morning before giving him any Lantus.  I had ordered Lantus when I saw him and by then it was about 40 minutes out that his insulin drip was stopped.  We will place him on sliding scale insulin as well as premeal regimen.  Last A1c in June was 10.3.  Will recheck A1c again.  Hypotension: Patient presented with hypotension with a blood pressure of 89/47.  Was likely due to being in DKA/dehydration.  Currently blood pressure normal and in  fact slightly elevated. We will resume home dose of amlodipine but hold hydralazine and Lasix for now.  If remains elevated, will resume hydralazine as well.  Acute on chronic symptomatic anemia/dyspnea: Per admitting hospitalist, patient complained of some dyspnea yesterday but he has no complaint today.  His hemoglobin was 6.8.  Iron studies were normal.   No history of melena or hematemesis.  FOBT ordered but still pending.  2 units of PRBC transfusion were ordered.  Per reports from nursing, 1 unit has been infused but there is some question whether all of it or some of it was infiltrated since his hemoglobin once again this morning is 6.8.  Second unit is being given right now.  We will repeat labs.  Wait for FOBT.  If positive, consult GI.  Mild hypokalemia:.  3.2 today.  Replace through IV.  Recheck in the morning.  Elevated troponin: 42> 85> 138.  Patient does not have any history of CAD.  Does not have any chest pain or shortness of breath.  No real ischemic changes on the EKG.  Last echo done in 2017.  Will repeat echo.  His elevated troponin is likely demand ischemia.  QT prolongation on EKG -Cardiac monitoring.  Keep potassium above 4 and magnesium above 2.  Avoid QT prolonging drugs if possible.  Repeat EKG in a.m.  DVT prophylaxis: SCDs Start: 08/14/20 2159   Code Status: DNR  Family Communication:  None present at bedside.  Plan of care discussed with patient in length and he verbalized understanding and agreed with it.  Tried calling patient's Sister Cherokee Clowers and left a voicemail.  Status is: Inpatient  Remains inpatient appropriate because:Inpatient level of care appropriate due to severity of illness   Dispo: The patient is from: Home              Anticipated d/c is to: Home              Anticipated d/c date is: 2 days              Patient currently is not medically stable to d/c.        Estimated body mass index is 21.71 kg/m as calculated from the following:   Height as of this encounter: 5\' 9"  (1.753 m).   Weight as of this encounter: 66.7 kg.      Nutritional status:               Consultants:   None  Procedures:   None  Antimicrobials:  Anti-infectives (From admission, onward)   None         Subjective: Patient seen and examined.  Still in the ED.  Patient completely alert and  oriented x3.  Denied having any complaint at all.  Objective: Vitals:   08/15/20 0915 08/15/20 0930 08/15/20 0945 08/15/20 1000  BP: (!) 142/70 (!) 152/86 (!) 161/85 (!) 158/80  Pulse: 66 65 66 68  Resp: 10 11 15 13   Temp:      TempSrc:      SpO2: 99% 98% 98% 98%  Weight:      Height:        Intake/Output Summary (Last 24 hours) at 08/15/2020 1035 Last data filed at 08/15/2020 0540 Gross per 24 hour  Intake 563.64 ml  Output --  Net 563.64 ml   Filed Weights   08/14/20 1700  Weight: 66.7 kg    Examination:  General exam: Appears calm and comfortable  Respiratory system: Clear to auscultation.  Respiratory effort normal. Cardiovascular system: S1 & S2 heard, RRR. No JVD, murmurs, rubs, gallops or clicks. No pedal edema. Gastrointestinal system: Abdomen is nondistended, soft and nontender. No organomegaly or masses felt. Normal bowel sounds heard. Central nervous system: Alert and oriented. No focal neurological deficits. Extremities: Symmetric 5 x 5 power. Skin: No rashes, lesions or ulcers Psychiatry: Judgement and insight appear normal. Mood & affect appropriate.    Data Reviewed: I have personally reviewed following labs and imaging studies  CBC: Recent Labs  Lab 08/14/20 1712 08/14/20 1853 08/15/20 0642  WBC 6.7  --   --   NEUTROABS 5.2  --   --   HGB 6.8* 7.5* 6.8*  HCT 22.5* 22.0* 21.5*  MCV 98.7  --   --   PLT 260  --   --    Basic Metabolic Panel: Recent Labs  Lab 08/14/20 1712 08/14/20 1853 08/14/20 2201 08/15/20 0642  NA 138 137  --  141  K 3.2* 3.0*  --  3.2*  CL 101  --   --  104  CO2 18*  --   --  23  GLUCOSE 566*  --   --  128*  BUN 69*  --   --  69*  CREATININE 7.66*  --   --  7.08*  CALCIUM 7.5*  --   --  7.6*  MG  --   --  2.2  --    GFR: Estimated Creatinine Clearance: 8.5 mL/min (A) (by C-G formula based on SCr of 7.08 mg/dL (H)). Liver Function Tests: Recent Labs  Lab 08/14/20 1712  AST 24  ALT 14  ALKPHOS 53  BILITOT 1.0   PROT 5.5*  ALBUMIN 2.3*   No results for input(s): LIPASE, AMYLASE in the last 168 hours. Recent Labs  Lab 08/14/20 1712  AMMONIA 26   Coagulation Profile: No results for input(s): INR, PROTIME in the last 168 hours. Cardiac Enzymes: No results for input(s): CKTOTAL, CKMB, CKMBINDEX, TROPONINI in the last 168 hours. BNP (last 3 results) No results for input(s): PROBNP in the last 8760 hours. HbA1C: No results for input(s): HGBA1C in the last 72 hours. CBG: Recent Labs  Lab 08/15/20 0115 08/15/20 0247 08/15/20 0416 08/15/20 0600 08/15/20 0802  GLUCAP 296* 183* 107* 86 70   Lipid Profile: No results for input(s): CHOL, HDL, LDLCALC, TRIG, CHOLHDL, LDLDIRECT in the last 72 hours. Thyroid Function Tests: No results for input(s): TSH, T4TOTAL, FREET4, T3FREE, THYROIDAB in the last 72 hours. Anemia Panel: Recent Labs    08/14/20 2155 08/14/20 2201 08/14/20 2203  VITAMINB12  --  1,117*  --   FOLATE  --   --  27.6  FERRITIN  --  189  --   TIBC  --  262  --   IRON  --  60  --   RETICCTPCT 0.9  --   --    Sepsis Labs: Recent Labs  Lab 08/14/20 2204  LATICACIDVEN 1.5    Recent Results (from the past 240 hour(s))  Respiratory Panel by RT PCR (Flu A&B, Covid) - Nasopharyngeal Swab     Status: None   Collection Time: 08/14/20  8:18 PM   Specimen: Nasopharyngeal Swab  Result Value Ref Range Status   SARS Coronavirus 2 by RT PCR NEGATIVE NEGATIVE Final    Comment: (NOTE) SARS-CoV-2 target nucleic acids are NOT DETECTED.  The SARS-CoV-2 RNA is generally detectable in upper respiratoy specimens during the acute phase of infection. The lowest concentration of SARS-CoV-2  viral copies this assay can detect is 131 copies/mL. A negative result does not preclude SARS-Cov-2 infection and should not be used as the sole basis for treatment or other patient management decisions. A negative result may occur with  improper specimen collection/handling, submission of specimen  other than nasopharyngeal swab, presence of viral mutation(s) within the areas targeted by this assay, and inadequate number of viral copies (<131 copies/mL). A negative result must be combined with clinical observations, patient history, and epidemiological information. The expected result is Negative.  Fact Sheet for Patients:  PinkCheek.be  Fact Sheet for Healthcare Providers:  GravelBags.it  This test is no t yet approved or cleared by the [place] FDA and  has been authorized for detection and/or diagnosis of SARS-CoV-2 by FDA under an Emergency Use Authorization (EUA). This EUA will remain  in effect (meaning this test can be used) for the duration of the COVID-19 declaration under Section 564(b)(1) of the Act, 21 U.S.C. section 360bbb-3(b)(1), unless the authorization is terminated or revoked sooner.     Influenza A by PCR NEGATIVE NEGATIVE Final   Influenza B by PCR NEGATIVE NEGATIVE Final    Comment: (NOTE) The Xpert Xpress SARS-CoV-2/FLU/RSV assay is intended as an aid in  the diagnosis of influenza from Nasopharyngeal swab specimens and  should not be used as a sole basis for treatment. Nasal washings and  aspirates are unacceptable for Xpert Xpress SARS-CoV-2/FLU/RSV  testing.  Fact Sheet for Patients: PinkCheek.be  Fact Sheet for Healthcare Providers: GravelBags.it  This test is not yet approved or cleared by the [place] FDA and  has been authorized for detection and/or diagnosis of SARS-CoV-2 by  FDA under an Emergency Use Authorization (EUA). This EUA will remain  in effect (meaning this test can be used) for the duration of the  Covid-19 declaration under Section 564(b)(1) of the Act, 21  U.S.C. section 360bbb-3(b)(1), unless the authorization is  terminated or revoked. Performed at [facility], [place] [address],  [place], [place] [address]       Radiology Studies: CT HEAD WO CONTRAST  Result Date: 08/14/2020 CLINICAL DATA:  Altered mental status. EXAM: CT HEAD WITHOUT CONTRAST TECHNIQUE: Contiguous axial images were obtained from the base of the skull through the vertex without intravenous contrast. COMPARISON:  Head CT 04/14/2020 FINDINGS: Brain: Again seen generalized atrophy and moderate chronic small vessel ischemia. Small chronic lacunar infarct in the subcortical white matter of the posterior right frontal lobe. No intracranial hemorrhage, mass effect, or midline shift. No hydrocephalus. The basilar cisterns are patent. No evidence of territorial infarct or acute ischemia. No extra-axial or intracranial fluid collection. Vascular: Atherosclerosis of skullbase vasculature without hyperdense vessel or abnormal calcification. Skull: No fracture or focal lesion. Sinuses/Orbits: There is mucosal thickening involving ethmoid air cells, maxillary sinuses, and sphenoid sinuses. Fluid levels involving the left greater than right sphenoid sinus. Mastoid air cells are clear. Other: None. IMPRESSION: 1. No acute intracranial abnormality. 2. Stable atrophy and chronic small vessel ischemia. 3. Paranasal sinus disease with fluid levels in the sphenoid sinus, may represent acute sinusitis. Electronically Signed   By: Keith Rake M.D.   On: 08/14/2020 18:39   US RENAL  Result Date: 08/14/2020 CLINICAL DATA:  Initial evaluation for acute kidney injury. EXAM: RENAL / URINARY TRACT ULTRASOUND COMPLETE COMPARISON:  Prior ultrasound from 10/03/2018. FINDINGS: Right Kidney: Renal measurements: 9.7 x 4.1 x 4.2 cm = volume: 85 mL. Increased echogenicity within the renal parenchyma. No nephrolithiasis or hydronephrosis. No  focal renal mass. Left Kidney: Renal measurements: 8.7 x 4.4 x 4.1 cm = volume: 82 mL. Left kidney somewhat difficult to visualize due to shadowing from overlying bowel gas. Visualized portions demonstrate  increased echogenicity within the renal parenchyma. No visible nephrolithiasis or hydronephrosis. No focal renal mass. Bladder: Appears normal for degree of bladder distention. Other: Incidental note made of a right pleural effusion. IMPRESSION: 1. Increased echogenicity within the renal parenchyma, compatible with medical renal disease. 2. No hydronephrosis or other acute abnormality. 3. Incidental right pleural effusion. Electronically Signed   By: Jeannine Boga M.D.   On: 08/14/2020 23:06   DG Chest Port 1 View  Result Date: 08/14/2020 CLINICAL DATA:  Dyspnea on exertion EXAM: PORTABLE CHEST 1 VIEW COMPARISON:  07/06/2020 FINDINGS: The heart size and mediastinal contours are within normal limits. Persistent mild elevation of the left hemidiaphragm. No pleural effusion. The visualized skeletal structures are unremarkable. IMPRESSION: No acute process in the chest. Electronically Signed   By: Macy Mis M.D.   On: 08/14/2020 17:58    Scheduled Meds: . insulin glargine  15 Units Subcutaneous Daily   Continuous Infusions: . dextrose 5% lactated ringers 75 mL/hr at 08/15/20 0254  . insulin 0.4 Units/hr (08/15/20 0601)  . lactated ringers Stopped (08/15/20 0249)  . potassium chloride 10 mEq (08/15/20 1005)     LOS: 1 day   Time spent: 45 minutes   Darliss Cheney, MD [facility]  08/15/2020, 10:35 AM   To contact the attending provider between 7A-7P or the covering provider during after hours 7P-7A, please log into the web site [email or web address].

## 2020-08-15 NOTE — Progress Notes (Signed)
Has shoes, socks, pants and t-shirt

## 2020-08-15 NOTE — ED Notes (Addendum)
Date and time results received: 08/15/20 0706 (use smartphrase ".now" to insert current time)  Test: Hgb Critical Value: 6.8  Name of Provider Notified:   Orders Received? Or Actions Taken?:   Page sent for provider, awaiting return call.

## 2020-08-15 NOTE — Plan of Care (Signed)
Patient is confused and forgetful at this time Problem: Health Behavior/Discharge Planning: Goal: Ability to identify and utilize available resources and services will improve Outcome: Not Progressing Goal: Ability to manage health-related needs will improve Outcome: Not Progressing   Problem: Fluid Volume: Goal: Ability to achieve a balanced intake and output will improve Outcome: Not Progressing   Problem: Metabolic: Goal: Ability to maintain appropriate glucose levels will improve Outcome: Not Progressing

## 2020-08-15 NOTE — ED Notes (Addendum)
Peripheral IV infiltrated, pt complaining of discomfort to Left extremity. Pt provided warm pack for extremity, extremity visibly swollen, and hard to touch. No discoloration noted at this time. IV d/c per protocol.

## 2020-08-15 NOTE — Progress Notes (Signed)
  Echocardiogram 2D Echocardiogram has been performed.  [name] [name] 08/15/2020, 3:42 PM

## 2020-08-16 LAB — CBC WITH DIFFERENTIAL/PLATELET
Abs Immature Granulocytes: 0.02 10*3/uL (ref 0.00–0.07)
Basophils Absolute: 0 10*3/uL (ref 0.0–0.1)
Basophils Relative: 0 %
Eosinophils Absolute: 0.4 10*3/uL (ref 0.0–0.5)
Eosinophils Relative: 7 %
HCT: 26.4 % — ABNORMAL LOW (ref 39.0–52.0)
Hemoglobin: 8.5 g/dL — ABNORMAL LOW (ref 13.0–17.0)
Immature Granulocytes: 0 %
Lymphocytes Relative: 30 %
Lymphs Abs: 1.9 10*3/uL (ref 0.7–4.0)
MCH: 30.4 pg (ref 26.0–34.0)
MCHC: 32.2 g/dL (ref 30.0–36.0)
MCV: 94.3 fL (ref 80.0–100.0)
Monocytes Absolute: 0.8 10*3/uL (ref 0.1–1.0)
Monocytes Relative: 12 %
Neutro Abs: 3.2 10*3/uL (ref 1.7–7.7)
Neutrophils Relative %: 51 %
Platelets: 283 10*3/uL (ref 150–400)
RBC: 2.8 MIL/uL — ABNORMAL LOW (ref 4.22–5.81)
RDW: 14.2 % (ref 11.5–15.5)
WBC: 6.3 10*3/uL (ref 4.0–10.5)
nRBC: 0 % (ref 0.0–0.2)

## 2020-08-16 LAB — TYPE AND SCREEN
ABO/RH(D): B POS
Antibody Screen: NEGATIVE
Unit division: 0
Unit division: 0

## 2020-08-16 LAB — BASIC METABOLIC PANEL
Anion gap: 10 (ref 5–15)
BUN: 68 mg/dL — ABNORMAL HIGH (ref 8–23)
CO2: 23 mmol/L (ref 22–32)
Calcium: 7.7 mg/dL — ABNORMAL LOW (ref 8.9–10.3)
Chloride: 108 mmol/L (ref 98–111)
Creatinine, Ser: 6.82 mg/dL — ABNORMAL HIGH (ref 0.61–1.24)
GFR, Estimated: 8 mL/min — ABNORMAL LOW (ref >60)
Glucose, Bld: 98 mg/dL (ref 70–99)
Potassium: 3.7 mmol/L (ref 3.5–5.1)
Sodium: 141 mmol/L (ref 135–145)

## 2020-08-16 LAB — GLUCOSE, CAPILLARY
Glucose-Capillary: 207 mg/dL — ABNORMAL HIGH (ref 70–99)
Glucose-Capillary: 241 mg/dL — ABNORMAL HIGH (ref 70–99)
Glucose-Capillary: 113 mg/dL — ABNORMAL HIGH (ref 70–99)
Glucose-Capillary: 88 mg/dL (ref 70–99)
Glucose-Capillary: 92 mg/dL (ref 70–99)

## 2020-08-16 LAB — BPAM RBC
Blood Product Expiration Date: 202111252359
Blood Product Expiration Date: 202111252359
ISSUE DATE / TIME: 202111022135
ISSUE DATE / TIME: 202111030820
Unit Type and Rh: 7300
Unit Type and Rh: 7300

## 2020-08-16 MED ORDER — INSULIN ASPART 100 UNIT/ML ~~LOC~~ SOLN
5.0000 [IU] | Freq: Three times a day (TID) | SUBCUTANEOUS | Status: DC
  Administered 2020-08-16 – 2020-08-18 (×6): 5 [IU] via SUBCUTANEOUS

## 2020-08-16 MED ORDER — LACTULOSE 10 GM/15ML PO SOLN
30.0000 g | Freq: Once | ORAL | Status: DC
  Filled 2020-08-16: qty 45

## 2020-08-16 MED ORDER — HYDRALAZINE HCL 50 MG PO TABS
100.0000 mg | ORAL_TABLET | Freq: Three times a day (TID) | ORAL | Status: DC
  Administered 2020-08-16 – 2020-08-18 (×7): 100 mg via ORAL
  Filled 2020-08-16 (×8): qty 2

## 2020-08-16 NOTE — Evaluation (Signed)
Physical Therapy Evaluation Patient Details Name: [name] [name] MRN: [id number] DOB: 1946-05-21 Today's Date: 08/16/2020   History of Present Illness  Pt adm with DKA, acute metabolic encephalopathy, and acute renal failure. PMH - DM, htn, CKD 4, history of DVT, history of CVA, EtOH use  Clinical Impression  Pt initially refusing PT eval but reluctantly agreed when I said the doctors needed to see if he could walk before they would let him go home. Pt is impulsive and with poor safety awareness which has also been well documented in previous notes during multiple previous admissions. Initially pt impulsively began pushing the overbed table when I suggested a rolling walker. I did get him to use a rolling walker for short ambulation. Pt refusing any further PT here, at home or at a SNF. Multiple other admissions where it was recommended he use a walker and he has not. This pt will always be a fall risk at home and without a willingness to actively participate in steps needed to reduce this he will remain so. Will sign off at this time.     Follow Up Recommendations Other (comment);Supervision for mobility/OOB (Pt adamantly refusing any further PT services)    Equipment Recommendations  None recommended by PT    Recommendations for Other Services       Precautions / Restrictions Precautions Precautions: Fall      Mobility  Bed Mobility Overal bed mobility: Modified Independent                  Transfers Overall transfer level: Needs assistance Equipment used: None Transfers: Sit to/from Stand Sit to Stand: Supervision         General transfer comment: Assist for safety and lines  Ambulation/Gait Ambulation/Gait assistance: Min guard Gait Distance (Feet): 30 Feet Assistive device: Rolling walker (2 wheeled) (Began by impulsively pushing rolling overbed table) Gait Pattern/deviations: Step-through pattern;Decreased stride length;Wide base of support;Trunk flexed Gait  velocity: decr Gait velocity interpretation: <1.8 ft/sec, indicate of risk for recurrent falls General Gait Details: Pt stood from bed without assistive device. When I suggested the use of a rolling walker he grabbed the rolling over the bed table and began pushing it. Had pt stop and able to get the rolling walker which he then used reluctantly.  Stairs            Wheelchair Mobility    Modified Rankin (Stroke Patients Only)       Balance Overall balance assessment: Needs assistance Sitting-balance support: No upper extremity supported;Feet supported Sitting balance-Leahy Scale: Good     Standing balance support: No upper extremity supported Standing balance-Leahy Scale: Fair                               Pertinent Vitals/Pain      Home Living Family/patient expects to be discharged to:: Private residence Living Arrangements: Parent (21 year old mother) Available Help at Discharge: Family;Available PRN/intermittently (Sisters come help some) Type of Home: House       Home Layout: One level Home Equipment: Other (comment) Additional Comments: Pt denies having rolling walker but per previous charts pt has had a rolling walker but refuses to use it.     Prior Function Level of Independence: Needs assistance   Gait / Transfers Assistance Needed: amb modified independent. Refuses use of rolling walker           Hand Dominance   Dominant Hand: Right  Extremity/Trunk Assessment   Upper Extremity Assessment Upper Extremity Assessment: Defer to OT evaluation    Lower Extremity Assessment Lower Extremity Assessment: Generalized weakness       Communication      Cognition Arousal/Alertness: Awake/alert Behavior During Therapy: Impulsive Overall Cognitive Status: History of cognitive impairments - at baseline Area of Impairment: Attention;Memory;Safety/judgement;Problem solving                   Current Attention Level:  Sustained Memory: Decreased recall of precautions;Decreased short-term memory   Safety/Judgement: Decreased awareness of safety;Decreased awareness of deficits   Problem Solving: Requires verbal cues General Comments: Pt impulsive and very poor safety awareness. Looking at prior charts this is likely pt's baseline      General Comments      Exercises     Assessment/Plan    PT Assessment  (Pt refusing any further PT services)  PT Problem List         PT Treatment Interventions      PT Goals (Current goals can be found in the Care Plan section)  Acute Rehab PT Goals Patient Stated Goal: go home PT Goal Formulation: All assessment and education complete, DC therapy    Frequency     Barriers to discharge        Co-evaluation               AM-PAC PT "6 Clicks" Mobility  Outcome Measure Help needed turning from your back to your side while in a flat bed without using bedrails?: None Help needed moving from lying on your back to sitting on the side of a flat bed without using bedrails?: None Help needed moving to and from a bed to a chair (including a wheelchair)?: A Little Help needed standing up from a chair using your arms (e.g., wheelchair or bedside chair)?: None Help needed to walk in hospital room?: A Little Help needed climbing 3-5 steps with a railing? : A Little 6 Click Score: 21    End of Session   Activity Tolerance: Other (comment) (Pt self limiting) Patient left: in bed;with call bell/phone within reach;with bed alarm set Nurse Communication: Mobility status PT Visit Diagnosis: Unsteadiness on feet (R26.81);Muscle weakness (generalized) (M62.81);History of falling (Z91.81)    Time: 1015-1024 PT Time Calculation (min) (ACUTE ONLY): 9 min   Charges:   PT Evaluation $PT Eval Moderate Complexity: [place] Pager [phone] Office [place] 08/16/2020, 10:50 AM

## 2020-08-16 NOTE — Evaluation (Signed)
Occupational Therapy Evaluation Patient Details Name: [name] [name] MRN: [id number] DOB: 1946/06/10 Today's Date: 08/16/2020    History of Present Illness Pt adm with DKA, acute metabolic encephalopathy, and acute renal failure. PMH - DM, htn, CKD 4, history of DVT, history of CVA, EtOH use   Clinical Impression   Patient admitted with the above diagnosis.  Presents with continued difficulties with awareness of deficits, poor safety, fair stand balance, and impulsive.  Unclear what his accurate home scenario is, but he is most likely unsafe at home, a high fall risk, and noncompliant with medical recommendations.  He declines any follow up rehab at home, declined further PT in acute, and declines use of DME.  OT will attempt to see him for functional tasks in the room, and see if he will mobilize in the halls, but it is unclear if he will cooperate.  If he declines, OT can discharge.       Follow Up Recommendations  Supervision/Assistance - 24 hour Patient declines [place] rehab.     Equipment Recommendations       Recommendations for Other Services       Precautions / Restrictions Precautions Precautions: Fall Restrictions Weight Bearing Restrictions: No      Mobility Bed Mobility Overal bed mobility: Modified Independent                  Transfers Overall transfer level: Needs assistance Equipment used: None Transfers: Sit to/from [employer] Transfers Sit to Stand: Supervision Stand pivot transfers: Min guard       General transfer comment: patient initially reached for OT's arm when moving, but then released his grip.    Balance Overall balance assessment: Needs assistance Sitting-balance support: No upper extremity supported;Feet supported Sitting balance-Leahy Scale: Good     Standing balance support: No upper extremity supported Standing balance-Leahy Scale: Fair Standing balance comment: Wide BOS and short choppy strides.                            ADL either performed or assessed with clinical judgement   ADL Overall ADL's : Needs assistance/impaired Eating/Feeding: Independent;Sitting   Grooming: Wash/dry hands;Wash/dry face;Supervision/safety;Sitting   Upper Body Bathing: Supervision/ safety;Sitting   Lower Body Bathing: Minimal assistance;Sitting/lateral leans Lower Body Bathing Details (indicate cue type and reason): cannot reach feet Upper Body Dressing : Supervision/safety;Sitting   Lower Body Dressing: Minimal assistance;Sitting/lateral leans Lower Body Dressing Details (indicate cue type and reason): assist to place socks over toes, and he can grab and pull the rest of the way.             Functional mobility during ADLs: Supervision/safety General ADL Comments: generally unsafe at this time.  He declines any HH services or increased supports at home.     Vision Patient Visual Report: No change from baseline        Pertinent Vitals/Pain Pain Assessment: No/denies pain     Hand Dominance Right   Extremity/Trunk Assessment Upper Extremity Assessment Upper Extremity Assessment: Overall WFL for tasks assessed   Lower Extremity Assessment Lower Extremity Assessment: Defer to PT evaluation       Communication Communication Communication: Expressive difficulties   Cognition Arousal/Alertness: Awake/alert Behavior During Therapy: Impulsive Overall Cognitive Status: History of cognitive impairments - at baseline                       Memory: Decreased short-term memory   Safety/Judgement: Decreased awareness  of deficits;Decreased awareness of safety   Problem Solving: Requires verbal cues                      Home Living Family/patient expects to be discharged to:: Private residence Living Arrangements: Parent Available Help at Discharge: Family;Available PRN/intermittently Type of Home: House       Home Layout: One level     Bathroom Shower/Tub: Medical illustrator: Standard     Home Equipment: Environmental consultant - 2 wheels          Prior Functioning/Environment Level of Independence: Needs assistance  Gait / Transfers Assistance Needed: pt reports he wlaks around on his own ADL's / Homemaking Assistance Needed: pt reports independence in all ADLs Communication / Swallowing Assistance Needed: speech slurred and garbled somewhat difficult to understand          OT Problem List: Decreased safety awareness;Impaired balance (sitting and/or standing)      OT Treatment/Interventions: Self-care/ADL training;Balance training;Patient/family education;Therapeutic activities    OT Goals(Current goals can be found in the care plan section) Acute Rehab OT Goals Patient Stated Goal: Hoping to go home tomorrow OT Goal Formulation: With patient Time For Goal Achievement: 09/06/20 Potential to Achieve Goals: Fair ADL Goals Pt Will Perform Grooming: with set-up;sitting;standing Pt Will Perform Lower Body Bathing: with set-up;sit to/from stand Pt Will Perform Lower Body Dressing: with set-up;sit to/from stand Pt Will Transfer to Toilet: with modified independence;regular height toilet;ambulating  OT Frequency: Min 2X/week   Barriers to D/C: Decreased caregiver support          Co-evaluation              AM-PAC OT "6 Clicks" Daily Activity     Outcome Measure Help from another person eating meals?: None Help from another person taking care of personal grooming?: None Help from another person toileting, which includes using toliet, bedpan, or urinal?: A Little Help from another person bathing (including washing, rinsing, drying)?: A Little Help from another person to put on and taking off regular upper body clothing?: A Little Help from another person to put on and taking off regular lower body clothing?: A Little 6 Click Score: 20   End of Session    Activity Tolerance: Patient tolerated treatment well Patient left: in bed;with  call bell/phone within reach;with bed alarm set  OT Visit Diagnosis: Unsteadiness on feet (R26.81);Other symptoms and signs involving cognitive function                Time: 6644-0347 OT Time Calculation (min): 18 min Charges:  OT General Charges $OT Visit: 1 Visit OT Evaluation $OT Eval Moderate Complexity: 1 Mod  08/16/2020  Rich, OTR/L  Acute Rehabilitation Services  Office:  [place] 08/16/2020, 5:05 PM

## 2020-08-16 NOTE — Progress Notes (Signed)
PROGRESS NOTE    [name] [name]  YHC:[id number] DOB: [birth year] DOA: 08/14/2020 PCP: Libby Maw, MD   Brief Narrative:  HPI: Erika Hussar is a 74 y.o. male with medical history significant of brittle and uncontrolled insulin-dependent diabetes, CKD stage IV, chronic diastolic CHF, history of CVA with cognitive dysfunction, hypertension, hyperlipidemia, hypothyroidism presenting to the ED via EMS for evaluation of altered mental status.  Per EMS, patient sister told them that prior to patient becoming altered he ate a lot of donuts.  When EMS arrived his CBG was in the 500s.  Patient was groggy but able to answer questions.  He was brought to the hospital for further evaluation.  Family reported patient having dyspnea on exertion and lower extremity edema despite taking his Lasix. History provided by patient limited as he appears confused. Reports having intermittent shortness of breath, unclear for how long and better it is exertional or at rest. He has no other complaints. Denies fevers, rhinorrhea, nasal congestion, sinus pain/pressure, cough, chest pain, nausea, vomiting, abdominal pain, diarrhea, or dysuria.  ED Course: Afebrile.  Not tachycardic.  Hypotensive with systolic in the 51V to 61Y.  Not tachypneic or hypoxic.  WBC 6.7, hemoglobin 6.8, hematocrit 22.5, platelet 260k.  Sodium 138, potassium 3.2, chloride 101, bicarb 18, anion gap 19, BUN 69, creatinine 7.6, glucose 566, corrected calcium 8.9, LFTs normal.  Blood ethanol level undetectable.  Ammonia level normal.  UDS pending.  Initial high-sensitivity troponin mildly elevated at 42, repeat pending.  EKG without acute ischemic changes.  BNP 178.  VBG with pH 7.40.  Screening SARS-CoV-2 PCR test and influenza panel pending.  Chest x-ray showing no acute process.  Head CT negative for acute intracranial abnormality. Patient received NovoLog 10 units.  2 units PRBCs ordered.  Assessment & Plan:   Principal Problem:   Acute renal  failure superimposed on stage 4 chronic kidney disease ([name]) Active Problems:   Hyperglycemia   Acute metabolic encephalopathy   Anemia   Diabetes mellitus type I ([place])   AKI (acute kidney injury) ([place])   Acute metabolic encephalopathy:  Head CT negative for acute intracranial abnormality. No infectious signs or symptoms. Blood ethanol level undetectable.  Ammonia level normal.  His encephalopathy was likely due to being in DKA.  With improvement in DKA, encephalopathy has improved.  He remains alert and oriented x2, at his baseline.  AKI on CKD stage V: His last known creatinine was 5.35 in September this year and he presented with 7.66.  Creatinine has improved slightly more today.  This was likely due to dehydration due to being in DKA.  We will continue him on gentle IV hydration.  Repeat labs in the morning.  CO2 normal.  Continue to hold nephrotoxic agents.  DKA in a patient with uncontrolled insulin-dependent type 2 diabetes mellitus: Patient was admitted with DKA.  He was started on insulin drip as well as IV fluids per protocol.  DKA resolved.  Started on 15 units Lantus yesterday and 3 times daily Premeal 3 units.  Slightly hyperglycemic.  Increase Premeal to 5 units and continue current Lantus and SSI.  Hypotension: Patient presented with hypotension with a blood pressure of 89/47.  Was likely due to being in DKA/dehydration.  Blood pressure improved and in fact was elevated so resumed amlodipine but held hydralazine and Lasix.  Blood pressure more elevated today.  Resume hydralazine and continue to hold Lasix.  Acute on chronic symptomatic anemia/dyspnea: Per admitting hospitalist, patient complained of some dyspnea yesterday  but he has no complaint today.  His hemoglobin was 6.8.  Iron studies were normal.  No history of melena or hematemesis.  FOBT ordered but still pending for last 2 days.  Advised RN to make sure that sample is collected He has a bowel movement.  2 units of PRBC  transfusion were ordered.  Per reports from nursing, 1 unit has been infused but there is some question whether all of it or some of it was infiltrated since his hemoglobin once again yesterday was 6.8.  He received second unit yesterday.  Hemoglobin over 8 today.  Mild hypokalemia:.  Resolved.  Elevated troponin: 42> 85> 138.  Patient does not have any history of CAD.  Does not have any chest pain or shortness of breath.  No real ischemic changes on the EKG.  Last echo done in 2017.  Repeat echo shows normal ejection fraction with no wall motion abnormality.  Likely demand ischemia.  QT prolongation on EKG -Cardiac monitoring.  Keep potassium above 4 and magnesium above 2.  Avoid QT prolonging drugs if possible.  Repeat EKG in a.m.  DVT prophylaxis: SCDs Start: 08/14/20 2159   Code Status: DNR  Family Communication:  None present at bedside.  Discussed with patient's Sister Deitrich Steve  Status is: Inpatient  Remains inpatient appropriate because:Inpatient level of care appropriate due to severity of illness   Dispo: The patient is from: Home              Anticipated d/c is to: Home              Anticipated d/c date is: 1 to 2 days              Patient currently is not medically stable to d/c.        Estimated body mass index is 21.71 kg/m as calculated from the following:   Height as of this encounter: 5\' 9"  (1.753 m).   Weight as of this encounter: 66.7 kg.      Nutritional status:               Consultants:   None  Procedures:   None  Antimicrobials:  Anti-infectives (From admission, onward)   None         Subjective: Seen and examined.  Remains alert and oriented x3.  Pleasant.  No complaints.  Nose is name, his date of birth and current POTUS  Objective: Vitals:   08/16/20 0449 08/16/20 0816 08/16/20 1029 08/16/20 1211  BP: (!) 141/79 (!) 165/83 (!) 147/89 (!) 157/87  Pulse: (!) 109 85  89  Resp: 18 18  16   Temp: 99.1 F (37.3 C) 98.2  F (36.8 C)  98.9 F (37.2 C)  TempSrc: Oral Oral  Oral  SpO2: 95% 96%  98%  Weight:      Height:        Intake/Output Summary (Last 24 hours) at 08/16/2020 1455 Last data filed at 08/16/2020 1336 Gross per 24 hour  Intake 1386.21 ml  Output 1150 ml  Net 236.21 ml   Filed Weights   08/14/20 1700  Weight: 66.7 kg    Examination:  General exam: Appears calm and comfortable  Respiratory system: Clear to auscultation. Respiratory effort normal. Cardiovascular system: S1 & S2 heard, RRR. No JVD, murmurs, rubs, gallops or clicks. No pedal edema. Gastrointestinal system: Abdomen is nondistended, soft and nontender. No organomegaly or masses felt. Normal bowel sounds heard. Central nervous system: Alert and oriented x3. No focal  neurological deficits. Extremities: Symmetric 5 x 5 power. Skin: No rashes, lesions or ulcers.  Psychiatry: Judgement and insight appear poor. Mood & affect appropriate.   Data Reviewed: I have personally reviewed following labs and imaging studies  CBC: Recent Labs  Lab 08/14/20 1712 08/14/20 1853 08/15/20 0642 08/16/20 0158  WBC 6.7  --   --  6.3  NEUTROABS 5.2  --   --  3.2  HGB 6.8* 7.5* 6.8* 8.5*  HCT 22.5* 22.0* 21.5* 26.4*  MCV 98.7  --   --  94.3  PLT 260  --   --  315   Basic Metabolic Panel: Recent Labs  Lab 08/14/20 1712 08/14/20 1853 08/14/20 2201 08/15/20 0642 08/16/20 0158  NA 138 137  --  141 141  K 3.2* 3.0*  --  3.2* 3.7  CL 101  --   --  104 108  CO2 18*  --   --  23 23  GLUCOSE 566*  --   --  128* 98  BUN 69*  --   --  69* 68*  CREATININE 7.66*  --   --  7.08* 6.82*  CALCIUM 7.5*  --   --  7.6* 7.7*  MG  --   --  2.2  --   --    GFR: Estimated Creatinine Clearance: 8.8 mL/min (A) (by C-G formula based on SCr of 6.82 mg/dL (H)). Liver Function Tests: Recent Labs  Lab 08/14/20 1712  AST 24  ALT 14  ALKPHOS 53  BILITOT 1.0  PROT 5.5*  ALBUMIN 2.3*   No results for input(s): LIPASE, AMYLASE in the last 168  hours. Recent Labs  Lab 08/14/20 1712  AMMONIA 26   Coagulation Profile: No results for input(s): INR, PROTIME in the last 168 hours. Cardiac Enzymes: No results for input(s): CKTOTAL, CKMB, CKMBINDEX, TROPONINI in the last 168 hours. BNP (last 3 results) No results for input(s): PROBNP in the last 8760 hours. HbA1C: Recent Labs    08/15/20 1213  HGBA1C 10.7*   CBG: Recent Labs  Lab 08/15/20 1217 08/15/20 1744 08/15/20 2220 08/16/20 0813 08/16/20 1209  GLUCAP 96 129* 105* 207* 241*   Lipid Profile: No results for input(s): CHOL, HDL, LDLCALC, TRIG, CHOLHDL, LDLDIRECT in the last 72 hours. Thyroid Function Tests: No results for input(s): TSH, T4TOTAL, FREET4, T3FREE, THYROIDAB in the last 72 hours. Anemia Panel: Recent Labs    08/14/20 2155 08/14/20 2201 08/14/20 2203  VITAMINB12  --  1,117*  --   FOLATE  --   --  27.6  FERRITIN  --  189  --   TIBC  --  262  --   IRON  --  60  --   RETICCTPCT 0.9  --   --    Sepsis Labs: Recent Labs  Lab 08/14/20 2204  LATICACIDVEN 1.5    Recent Results (from the past 240 hour(s))  Respiratory Panel by RT PCR (Flu A&B, Covid) - Nasopharyngeal Swab     Status: None   Collection Time: 08/14/20  8:18 PM   Specimen: Nasopharyngeal Swab  Result Value Ref Range Status   SARS Coronavirus 2 by RT PCR NEGATIVE NEGATIVE Final    Comment: (NOTE) SARS-CoV-2 target nucleic acids are NOT DETECTED.  The SARS-CoV-2 RNA is generally detectable in upper respiratoy specimens during the acute phase of infection. The lowest concentration of SARS-CoV-2 viral copies this assay can detect is 131 copies/mL. A negative result does not preclude SARS-Cov-2 infection and should not be used  as the sole basis for treatment or other patient management decisions. A negative result may occur with  improper specimen collection/handling, submission of specimen other than nasopharyngeal swab, presence of viral mutation(s) within the areas targeted by  this assay, and inadequate number of viral copies (<131 copies/mL). A negative result must be combined with clinical observations, patient history, and epidemiological information. The expected result is Negative.  Fact Sheet for Patients:  PinkCheek.be  Fact Sheet for Healthcare Providers:  GravelBags.it  This test is no t yet approved or cleared by the [place] FDA and  has been authorized for detection and/or diagnosis of SARS-CoV-2 by FDA under an Emergency Use Authorization (EUA). This EUA will remain  in effect (meaning this test can be used) for the duration of the COVID-19 declaration under Section 564(b)(1) of the Act, 21 U.S.C. section 360bbb-3(b)(1), unless the authorization is terminated or revoked sooner.     Influenza A by PCR NEGATIVE NEGATIVE Final   Influenza B by PCR NEGATIVE NEGATIVE Final    Comment: (NOTE) The Xpert Xpress SARS-CoV-2/FLU/RSV assay is intended as an aid in  the diagnosis of influenza from Nasopharyngeal swab specimens and  should not be used as a sole basis for treatment. Nasal washings and  aspirates are unacceptable for Xpert Xpress SARS-CoV-2/FLU/RSV  testing.  Fact Sheet for Patients: PinkCheek.be  Fact Sheet for Healthcare Providers: GravelBags.it  This test is not yet approved or cleared by the [place] FDA and  has been authorized for detection and/or diagnosis of SARS-CoV-2 by  FDA under an Emergency Use Authorization (EUA). This EUA will remain  in effect (meaning this test can be used) for the duration of the  Covid-19 declaration under Section 564(b)(1) of the Act, 21  U.S.C. section 360bbb-3(b)(1), unless the authorization is  terminated or revoked. Performed at [facility], [place] [address], [place],  [address]       Radiology Studies: CT HEAD WO CONTRAST  Result Date:  08/14/2020 CLINICAL DATA:  Altered mental status. EXAM: CT HEAD WITHOUT CONTRAST TECHNIQUE: Contiguous axial images were obtained from the base of the skull through the vertex without intravenous contrast. COMPARISON:  Head CT 04/14/2020 FINDINGS: Brain: Again seen generalized atrophy and moderate chronic small vessel ischemia. Small chronic lacunar infarct in the subcortical white matter of the posterior right frontal lobe. No intracranial hemorrhage, mass effect, or midline shift. No hydrocephalus. The basilar cisterns are patent. No evidence of territorial infarct or acute ischemia. No extra-axial or intracranial fluid collection. Vascular: Atherosclerosis of skullbase vasculature without hyperdense vessel or abnormal calcification. Skull: No fracture or focal lesion. Sinuses/Orbits: There is mucosal thickening involving ethmoid air cells, maxillary sinuses, and sphenoid sinuses. Fluid levels involving the left greater than right sphenoid sinus. Mastoid air cells are clear. Other: None. IMPRESSION: 1. No acute intracranial abnormality. 2. Stable atrophy and chronic small vessel ischemia. 3. Paranasal sinus disease with fluid levels in the sphenoid sinus, may represent acute sinusitis. Electronically Signed   By: Keith Rake M.D.   On: 08/14/2020 18:39   US RENAL  Result Date: 08/14/2020 CLINICAL DATA:  Initial evaluation for acute kidney injury. EXAM: RENAL / URINARY TRACT ULTRASOUND COMPLETE COMPARISON:  Prior ultrasound from 10/03/2018. FINDINGS: Right Kidney: Renal measurements: 9.7 x 4.1 x 4.2 cm = volume: 85 mL. Increased echogenicity within the renal parenchyma. No nephrolithiasis or hydronephrosis. No focal renal mass. Left Kidney: Renal measurements: 8.7 x 4.4 x 4.1 cm = volume: 82 mL. Left kidney somewhat difficult to  visualize due to shadowing from overlying bowel gas. Visualized portions demonstrate increased echogenicity within the renal parenchyma. No visible nephrolithiasis or  hydronephrosis. No focal renal mass. Bladder: Appears normal for degree of bladder distention. Other: Incidental note made of a right pleural effusion. IMPRESSION: 1. Increased echogenicity within the renal parenchyma, compatible with medical renal disease. 2. No hydronephrosis or other acute abnormality. 3. Incidental right pleural effusion. Electronically Signed   By: Jeannine Boga M.D.   On: 08/14/2020 23:06   DG Chest Port 1 View  Result Date: 08/14/2020 CLINICAL DATA:  Dyspnea on exertion EXAM: PORTABLE CHEST 1 VIEW COMPARISON:  07/06/2020 FINDINGS: The heart size and mediastinal contours are within normal limits. Persistent mild elevation of the left hemidiaphragm. No pleural effusion. The visualized skeletal structures are unremarkable. IMPRESSION: No acute process in the chest. Electronically Signed   By: Macy Mis M.D.   On: 08/14/2020 17:58   ECHOCARDIOGRAM COMPLETE  Result Date: 08/15/2020    ECHOCARDIOGRAM REPORT   Patient Name:   ARRIE ZUERCHER Date of Exam: 08/15/2020 Medical Rec #:  [id number]   Height:       69.0 in Accession #:    [id number]  Weight:       147.0 lb Date of Birth:  June 07, 1946    BSA:          1.813 m Patient Age:    15 years    BP:           124/67 mmHg Patient Gender: M           HR:           74 bpm. Exam Location:  Inpatient Procedure: 2D Echo, Cardiac Doppler and Color Doppler Indications:    Elevated troponin  History:        Patient has prior history of Echocardiogram examinations, most                 recent 07/07/2016. CHF; Risk Factors:Diabetes, Hypertension and                 Dyslipidemia. CKD. H/O CVA.  Sonographer:    [name] Lefort RDCS (AE) Referring Phys: [id number] [place] Durell IMPRESSIONS  1. Left ventricular ejection fraction, by estimation, is 50 to 55%. The left ventricle has low normal function. The left ventricle has no regional wall motion abnormalities. There is mild left ventricular hypertrophy. Left ventricular diastolic parameters were normal.  2.  Right ventricular systolic function is normal. The right ventricular size is normal. There is mildly elevated pulmonary artery systolic pressure. The estimated right ventricular systolic pressure is 34.7 mmHg.  3. Left atrial size was mildly dilated.  4. The mitral valve is normal in structure. Trivial mitral valve regurgitation. No evidence of mitral stenosis.  5. The tricuspid valve is degenerative. Mild TR.  6. The aortic valve is abnormal. There is moderate calcification of the aortic valve. Aortic valve regurgitation is not visualized. Aortic valve sclerosis/calcification is present, without any evidence of aortic stenosis. Aortic valve mean gradient measures 9.0 mmHg.  7. The inferior vena cava is normal in size with <50% respiratory variability, suggesting right atrial pressure of 8 mmHg. FINDINGS  Left Ventricle: Left ventricular ejection fraction, by estimation, is 50 to 55%. The left ventricle has low normal function. The left ventricle has no regional wall motion abnormalities. The left ventricular internal cavity size was normal in size. There is mild left ventricular hypertrophy. Left ventricular diastolic parameters were normal. Right Ventricle: The right ventricular size  is normal. No increase in right ventricular wall thickness. Right ventricular systolic function is normal. There is mildly elevated pulmonary artery systolic pressure. The tricuspid regurgitant velocity is 2.63  m/s, and with an assumed right atrial pressure of 10 mmHg, the estimated right ventricular systolic pressure is 16.3 mmHg. Left Atrium: Left atrial size was mildly dilated. Right Atrium: Right atrial size was normal in size. Pericardium: There is no evidence of pericardial effusion. Mitral Valve: The mitral valve is normal in structure. Trivial mitral valve regurgitation. No evidence of mitral valve stenosis. MV peak gradient, 5.8 mmHg. The mean mitral valve gradient is 2.0 mmHg. Tricuspid Valve: The tricuspid valve is  degenerative in appearance. Tricuspid valve regurgitation is mild . No evidence of tricuspid stenosis. Aortic Valve: The aortic valve is abnormal. There is moderate calcification of the aortic valve. Aortic valve regurgitation is not visualized. Mild to moderate aortic valve sclerosis/calcification is present, without any evidence of aortic stenosis. Aortic valve mean gradient measures 9.0 mmHg. Aortic valve peak gradient measures 14.4 mmHg. Aortic valve area, by VTI measures 1.31 cm. Pulmonic Valve: The pulmonic valve was not well visualized. Pulmonic valve regurgitation is not visualized. No evidence of pulmonic stenosis. Aorta: The aortic root is normal in size and structure. Venous: The inferior vena cava is normal in size with less than 50% respiratory variability, suggesting right atrial pressure of 8 mmHg. IAS/Shunts: No atrial level shunt detected by color flow Doppler.  LEFT VENTRICLE PLAX 2D LVIDd:         4.80 cm  Diastology LVIDs:         3.30 cm  LV e' medial:    9.14 cm/s LV PW:         1.40 cm  LV E/e' medial:  8.8 LV IVS:        1.30 cm  LV e' lateral:   8.16 cm/s LVOT diam:     1.80 cm  LV E/e' lateral: 9.9 LV SV:         58 LV SV Index:   32 LVOT Area:     2.54 cm  RIGHT VENTRICLE          IVC RV Basal diam:  2.80 cm  IVC diam: 2.00 cm TAPSE (M-mode): 2.4 cm LEFT ATRIUM             Index       RIGHT ATRIUM           Index LA diam:        4.10 cm 2.26 cm/m  RA Area:     15.40 cm LA Vol (A2C):   79.1 ml 43.64 ml/m RA Volume:   35.20 ml  19.42 ml/m LA Vol (A4C):   63.4 ml 34.98 ml/m LA Biplane Vol: 70.0 ml 38.62 ml/m  AORTIC VALVE AV Area (Vmax):    1.41 cm AV Area (Vmean):   1.34 cm AV Area (VTI):     1.31 cm AV Vmax:           190.00 cm/s AV Vmean:          143.000 cm/s AV VTI:            0.441 m AV Peak Grad:      14.4 mmHg AV Mean Grad:      9.0 mmHg LVOT Vmax:         105.00 cm/s LVOT Vmean:        75.500 cm/s LVOT VTI:  0.227 m LVOT/AV VTI ratio: 0.51  AORTA Ao Root diam: 3.30  cm Ao Asc diam:  3.30 cm MITRAL VALVE               TRICUSPID VALVE MV Area (PHT): 2.95 cm    TR Peak grad:   27.7 mmHg MV Peak grad:  5.8 mmHg    TR Vmax:        263.00 cm/s MV Mean grad:  2.0 mmHg MV Vmax:       1.20 m/s    SHUNTS MV Vmean:      58.0 cm/s   Systemic VTI:  0.23 m MV Decel Time: 257 msec    Systemic Diam: 1.80 cm MV E velocity: 80.60 cm/s MV A velocity: 75.80 cm/s MV E/A ratio:  1.06 Cherlynn Kaiser MD Electronically signed by Cherlynn Kaiser MD Signature Date/Time: 08/15/2020/11:21:54 PM    Final     Scheduled Meds: . amLODipine  10 mg Oral Daily  . aspirin EC  81 mg Oral Daily  . atorvastatin  40 mg Oral Daily  . insulin aspart  0-15 Units Subcutaneous TID WC  . insulin aspart  0-5 Units Subcutaneous QHS  . insulin aspart  3 Units Subcutaneous TID WC  . insulin glargine  15 Units Subcutaneous Daily  . lactulose  30 g Oral Once  . levothyroxine  25 mcg Oral QAC breakfast  . sodium bicarbonate  650 mg Oral BID  . cyanocobalamin  1,000 mcg Oral Daily   Continuous Infusions: . sodium chloride 75 mL/hr at 08/16/20 1034  . dextrose 5% lactated ringers Stopped (08/15/20 0900)  . insulin Stopped (08/15/20 0815)  . lactated ringers Stopped (08/15/20 0249)     LOS: 2 days   Time spent: 30 minutes  Darliss Cheney, MD [facility]  08/16/2020, 2:55 PM   To contact the attending provider between 7A-7P or the covering provider during after hours 7P-7A, please log into the web site [email or web address].

## 2020-08-16 NOTE — Progress Notes (Signed)
Inpatient Diabetes Program Recommendations  AACE/ADA: New Consensus Statement on Inpatient Glycemic Control (2015)  Target Ranges:  Prepandial:   less than 140 mg/dL      Peak postprandial:   less than 180 mg/dL (1-2 hours)      Critically ill patients:  140 - 180 mg/dL   Lab Results  Component Value Date   GLUCAP 207 (H) 08/16/2020   HGBA1C 10.7 (H) 08/15/2020    Review of Glycemic Control Results for [name] [name], [name] [name] (MRN [id number]) as of 08/16/2020 09:20  Ref. Range 08/15/2020 11:33 08/15/2020 12:17 08/15/2020 17:44 08/15/2020 22:20 08/16/2020 08:13  Glucose-Capillary Latest Ref Range: 70 - 99 mg/dL 62 (L) 96 129 (H) 105 (H) 207 (H)   Diabetes history: DM1 Outpatient Diabetes medications: Basaglar 5 units + Humalog 0-10 units tid meal coverage Current orders for Inpatient glycemic control: Lantus 15 units QD, Novolog 3 units TID, Novolog 0-15 units TID, Novolog 0-5 units QHS  Inpatient Diabetes Program Recommendations:    Patient well known to inpatient diabetes team.  Patient had mild low of 62 mg/dL following Lantus administration. Patient is sensitive to insulin, esp with ESRD.   -Decrease Lantus to 12 units QD (to start this AM) -Decrease correction to Novolog 0-6 units tid correction.  MD paged. Secure chat sent to RN.  Thanks, Bronson Curb, MSN, RNC-OB Diabetes Coordinator [phone] (8a-5p)

## 2020-08-17 DIAGNOSIS — D649 Anemia, unspecified: Secondary | ICD-10-CM

## 2020-08-17 DIAGNOSIS — N179 Acute kidney failure, unspecified: Principal | ICD-10-CM

## 2020-08-17 DIAGNOSIS — E1059 Type 1 diabetes mellitus with other circulatory complications: Secondary | ICD-10-CM

## 2020-08-17 DIAGNOSIS — N184 Chronic kidney disease, stage 4 (severe): Secondary | ICD-10-CM

## 2020-08-17 LAB — BASIC METABOLIC PANEL
Anion gap: 11 (ref 5–15)
BUN: 68 mg/dL — ABNORMAL HIGH (ref 8–23)
CO2: 20 mmol/L — ABNORMAL LOW (ref 22–32)
Calcium: 7.6 mg/dL — ABNORMAL LOW (ref 8.9–10.3)
Chloride: 107 mmol/L (ref 98–111)
Creatinine, Ser: 6.31 mg/dL — ABNORMAL HIGH (ref 0.61–1.24)
GFR, Estimated: 9 mL/min — ABNORMAL LOW (ref >60)
Glucose, Bld: 134 mg/dL — ABNORMAL HIGH (ref 70–99)
Potassium: 4.3 mmol/L (ref 3.5–5.1)
Sodium: 138 mmol/L (ref 135–145)

## 2020-08-17 LAB — CBC WITH DIFFERENTIAL/PLATELET
Abs Immature Granulocytes: 0.02 10*3/uL (ref 0.00–0.07)
Basophils Absolute: 0 10*3/uL (ref 0.0–0.1)
Basophils Relative: 1 %
Eosinophils Absolute: 0.4 10*3/uL (ref 0.0–0.5)
Eosinophils Relative: 6 %
HCT: 27.5 % — ABNORMAL LOW (ref 39.0–52.0)
Hemoglobin: 8.8 g/dL — ABNORMAL LOW (ref 13.0–17.0)
Immature Granulocytes: 0 %
Lymphocytes Relative: 24 %
Lymphs Abs: 1.5 10*3/uL (ref 0.7–4.0)
MCH: 30 pg (ref 26.0–34.0)
MCHC: 32 g/dL (ref 30.0–36.0)
MCV: 93.9 fL (ref 80.0–100.0)
Monocytes Absolute: 0.8 10*3/uL (ref 0.1–1.0)
Monocytes Relative: 13 %
Neutro Abs: 3.6 10*3/uL (ref 1.7–7.7)
Neutrophils Relative %: 56 %
Platelets: 279 10*3/uL (ref 150–400)
RBC: 2.93 MIL/uL — ABNORMAL LOW (ref 4.22–5.81)
RDW: 14.2 % (ref 11.5–15.5)
WBC: 6.4 10*3/uL (ref 4.0–10.5)
nRBC: 0 % (ref 0.0–0.2)

## 2020-08-17 LAB — GLUCOSE, CAPILLARY
Glucose-Capillary: 120 mg/dL — ABNORMAL HIGH (ref 70–99)
Glucose-Capillary: 135 mg/dL — ABNORMAL HIGH (ref 70–99)
Glucose-Capillary: 170 mg/dL — ABNORMAL HIGH (ref 70–99)
Glucose-Capillary: 177 mg/dL — ABNORMAL HIGH (ref 70–99)
Glucose-Capillary: 186 mg/dL — ABNORMAL HIGH (ref 70–99)
Glucose-Capillary: 56 mg/dL — ABNORMAL LOW (ref 70–99)
Glucose-Capillary: 65 mg/dL — ABNORMAL LOW (ref 70–99)

## 2020-08-17 NOTE — Progress Notes (Signed)
PROGRESS NOTE    [name] [name]  JKK:[id number] DOB: 13-Jul-1946 DOA: 08/14/2020  PCP: [name] Maw, MD    Brief Narrative: ZJI:RCVELF [name] a 74 y.o.malewith medical history significant ofbrittle and uncontrolled insulin-dependent diabetes, CKD stage IV, chronic diastolic CHF, history of CVA with cognitive dysfunction, hypertension, hyperlipidemia, hypothyroidism presenting to the ED via EMS for evaluation of altered mental status. Per EMS, patient sister told them that prior to patient becoming altered he ate a lot of donuts. When EMS arrived his CBG was in the 500s. Patient was groggy but able to answer questions. He was brought to the hospital for further evaluation. Family reported patient having dyspnea on exertion and lower extremity edema despite taking his Lasix. History provided by patient limited as he appears confused. Reports having intermittent shortness of breath, unclear for how long and better it is exertional or at rest. He has no other complaints. Denies fevers, rhinorrhea, nasal congestion, sinus pain/pressure, cough, chest pain, nausea, vomiting, abdominal pain, diarrhea, or dysuria.  ED Course:Afebrile. Not tachycardic. Hypotensive with systolic in the 81O to 17P. Not tachypneic or hypoxic. WBC 6.7, hemoglobin 6.8, hematocrit 22.5, platelet 260k.Sodium 138, potassium 3.2, chloride 101, bicarb 18, anion gap 19, BUN 69, creatinine 7.6, glucose 566,corrected calcium 8.9, LFTs normal. Blood ethanol level undetectable. Ammonia level normal. UDS pending. Initial high-sensitivity troponin mildly elevated at 42, repeat pending. EKG without acute ischemic changes. BNP 178. VBG with pH 7.40. Screening SARS-CoV-2 PCR test and influenza panel pending. Chest x-ray showing no acute process. Head CT negative for acute intracranial abnormality. Patient received NovoLog 10 units. 2 units PRBCs ordered.   Assessment & Plan: AME: Workup is negative with neg ct  and suspect it was from DKA metabolic in origin. Pt is alert awake and cooperative with exam ? baseline dementia to confusion due to surrounding.   AKI on CKD: Renally dose meds and avoid contrast studies.   DKA: Resolved./    Acute on chronic symptomatic anemia/dyspnea: Per admitting hospitalist, patient complained of some dyspnea yesterday but he has no complaint today.  His hemoglobin was 6.8.  Iron studies were normal.  No history of melena or hematemesis.  FOBT ordered but still pending for last 2 days.  Advised RN to make sure that sample is collected He has a bowel movement.  2 units of PRBC transfusion were ordered.  Per reports from nursing, 1 unit has been infused but there is some question whether all of it or some of it was infiltrated since his hemoglobin once again yesterday was 6.8.  He received second unit yesterday.  Hemoglobin  8.8 today.  DVT prophylaxis: SCDs Start: 08/14/20 2159   Code Status: DNR  Family Communication:  None present at bedside.    Status is: Inpatient  Remains inpatient appropriate because:Inpatient level of care appropriate due to severity of illness   Dispo:  Patient From: Home  Planned Disposition: Home  Expected discharge date: 08/19/20  Medically stable for discharge: No  Consultants:  None  Procedures:  None  Subjective: Pt seen today he seemed little confused to me . As I asked him questions he seemed to get upset.Will d/w SW about plan for discharge maybe tomorrow. His stool card has not been collected yet.   Objective: Vitals:   08/17/20 0357 08/17/20 0828 08/17/20 1148 08/17/20 1730  BP: (!) 143/84 (!) 152/98 (!) 146/76 139/77  Pulse: 82  84   Resp: 20  20   Temp: 98.1 F (36.7 C)  98.6 F (37  C)   TempSrc: Oral  Oral   SpO2: 96%  99%   Weight: 78.4 kg     Height:       SpO2: 99 %  Intake/Output Summary (Last 24 hours) at 08/17/2020 1849 Last data filed at 08/17/2020 1700 Gross per 24 hour  Intake 3619.71 ml   Output 775 ml  Net 2844.71 ml   Filed Weights   08/14/20 1700 08/17/20 0357  Weight: 66.7 kg 78.4 kg    Examination: Blood pressure 139/77, pulse 84, temperature 98.6 F (37 C), temperature source Oral, resp. rate 20, height 5\' 9"  (1.753 m), weight 78.4 kg, SpO2 99 %. General exam: Appears calm and comfortable  HEENT:EOMI, perrl  Respiratory system: Clear to auscultation. Respiratory effort normal. Cardiovascular system: S1 & S2 heard, RRR. No JVD, murmurs, rubs, gallops or clicks. No pedal edema. Gastrointestinal system: Abdomen is nondistended, soft and nontender. No organomegaly or masses felt. Normal bowel sounds heard. Central nervous system: Alert and oriented.CN grossly intact No focal neurological deficits. Extremities: pt moving all 4 ext and ambulating. Skin: No rashes, lesions or ulcers Psychiatry:  Mood & affect appropriate, but pt got upset all of a sudden and seemed confused about DRE.  Data Reviewed: I have personally reviewed following labs and imaging studies  I/O last 3 completed shifts: In: 3335.5 [P.O.:1314; I.V.:2021.5] Out: 3716 [Urine:1825] Total I/O In: 1670.4 [P.O.:820; I.V.:850.4] Out: 300 [Urine:300] Lab Results  Component Value Date   CREATININE 6.31 (H) 08/17/2020   CREATININE 6.82 (H) 08/16/2020   CREATININE 7.08 (H) 08/15/2020   CBC: Recent Labs  Lab 08/14/20 1712 08/14/20 1853 08/15/20 0642 08/16/20 0158 08/17/20 0126  WBC 6.7  --   --  6.3 6.4  NEUTROABS 5.2  --   --  3.2 3.6  HGB 6.8* 7.5* 6.8* 8.5* 8.8*  HCT 22.5* 22.0* 21.5* 26.4* 27.5*  MCV 98.7  --   --  94.3 93.9  PLT 260  --   --  283 967   Basic Metabolic Panel: Recent Labs  Lab 08/14/20 1712 08/14/20 1853 08/14/20 2201 08/15/20 0642 08/16/20 0158 08/17/20 0126  NA 138 137  --  141 141 138  K 3.2* 3.0*  --  3.2* 3.7 4.3  CL 101  --   --  104 108 107  CO2 18*  --   --  23 23 20*  GLUCOSE 566*  --   --  128* 98 134*  BUN 69*  --   --  69* 68* 68*  CREATININE  7.66*  --   --  7.08* 6.82* 6.31*  CALCIUM 7.5*  --   --  7.6* 7.7* 7.6*  MG  --   --  2.2  --   --   --    GFR: Estimated Creatinine Clearance: 10.1 mL/min (A) (by C-G formula based on SCr of 6.31 mg/dL (H)). Liver Function Tests: Recent Labs  Lab 08/14/20 1712  AST 24  ALT 14  ALKPHOS 53  BILITOT 1.0  PROT 5.5*  ALBUMIN 2.3*   No results for input(s): LIPASE, AMYLASE in the last 168 hours. Recent Labs  Lab 08/14/20 1712  AMMONIA 26    Coagulation Profile: No results for input(s): INR, PROTIME in the last 168 hours. Cardiac Enzymes: No results for input(s): CKTOTAL, CKMB, CKMBINDEX, TROPONINI in the last 168 hours. BNP (last 3 results) No results for input(s): PROBNP in the last 8760 hours. HbA1C: Recent Labs    08/15/20 1213  HGBA1C 10.7*   CBG:  Recent Labs  Lab 08/16/20 2242 08/17/20 0754 08/17/20 1149 08/17/20 1454 08/17/20 1628  GLUCAP 92 135* 177* 186* 170*   Lipid Profile: No results for input(s): CHOL, HDL, LDLCALC, TRIG, CHOLHDL, LDLDIRECT in the last 72 hours. Thyroid Function Tests: No results for input(s): TSH, T4TOTAL, FREET4, T3FREE, THYROIDAB in the last 72 hours. Anemia Panel: Recent Labs    08/14/20 2155 08/14/20 2201 08/14/20 2203  VITAMINB12  --  1,117*  --   FOLATE  --   --  27.6  FERRITIN  --  189  --   TIBC  --  262  --   IRON  --  60  --   RETICCTPCT 0.9  --   --    Sepsis Labs: Recent Labs  Lab 08/14/20 2204  LATICACIDVEN 1.5    Recent Results (from the past 240 hour(s))  Respiratory Panel by RT PCR (Flu A&B, Covid) - Nasopharyngeal Swab     Status: None   Collection Time: 08/14/20  8:18 PM   Specimen: Nasopharyngeal Swab  Result Value Ref Range Status   SARS Coronavirus 2 by RT PCR NEGATIVE NEGATIVE Final    Comment: (NOTE) SARS-CoV-2 target nucleic acids are NOT DETECTED.  The SARS-CoV-2 RNA is generally detectable in upper respiratoy specimens during the acute phase of infection. The lowest concentration of  SARS-CoV-2 viral copies this assay can detect is 131 copies/mL. A negative result does not preclude SARS-Cov-2 infection and should not be used as the sole basis for treatment or other patient management decisions. A negative result may occur with  improper specimen collection/handling, submission of specimen other than nasopharyngeal swab, presence of viral mutation(s) within the areas targeted by this assay, and inadequate number of viral copies (<131 copies/mL). A negative result must be combined with clinical observations, patient history, and epidemiological information. The expected result is Negative.  Fact Sheet for Patients:  PinkCheek.be  Fact Sheet for Healthcare Providers:  GravelBags.it  This test is no t yet approved or cleared by the [place] FDA and  has been authorized for detection and/or diagnosis of SARS-CoV-2 by FDA under an Emergency Use Authorization (EUA). This EUA will remain  in effect (meaning this test can be used) for the duration of the COVID-19 declaration under Section 564(b)(1) of the Act, 21 U.S.C. section 360bbb-3(b)(1), unless the authorization is terminated or revoked sooner.     Influenza A by PCR NEGATIVE NEGATIVE Final   Influenza B by PCR NEGATIVE NEGATIVE Final    Comment: (NOTE) The Xpert Xpress SARS-CoV-2/FLU/RSV assay is intended as an aid in  the diagnosis of influenza from Nasopharyngeal swab specimens and  should not be used as a sole basis for treatment. Nasal washings and  aspirates are unacceptable for Xpert Xpress SARS-CoV-2/FLU/RSV  testing.  Fact Sheet for Patients: PinkCheek.be  Fact Sheet for Healthcare Providers: GravelBags.it  This test is not yet approved or cleared by the [place] FDA and  has been authorized for detection and/or diagnosis of SARS-CoV-2 by  FDA under an Emergency Use  Authorization (EUA). This EUA will remain  in effect (meaning this test can be used) for the duration of the  Covid-19 declaration under Section 564(b)(1) of the Act, 21  U.S.C. section 360bbb-3(b)(1), unless the authorization is  terminated or revoked. Performed at [facility], [place] [address], [place], [place] [address]       Radiology Studies: No results found.    Current Facility-Administered Medications (Endocrine & Metabolic):  .  insulin  aspart (novoLOG) injection 0-15 Units .  insulin aspart (novoLOG) injection 0-5 Units .  insulin aspart (novoLOG) injection 5 Units .  insulin glargine (LANTUS) injection 15 Units .  insulin regular, human (MYXREDLIN) 100 units/ 100 mL infusion .  levothyroxine (SYNTHROID) tablet 25 mcg   Current Facility-Administered Medications (Cardiovascular):  .  amLODipine (NORVASC) tablet 10 mg .  atorvastatin (LIPITOR) tablet 40 mg .  hydrALAZINE (APRESOLINE) tablet 100 mg     Current Facility-Administered Medications (Analgesics):  .  acetaminophen (TYLENOL) tablet 650 mg **OR** acetaminophen (TYLENOL) suppository 650 mg *  .  aspirin EC tablet 81 mg   Current Facility-Administered Medications (Hematological):  .  vitamin B-12 (CYANOCOBALAMIN) tablet 1,000 mcg   Current Facility-Administered Medications (Other):  .  0.9 %  sodium chloride infusion .  dextrose 5 % in lactated ringers infusion .  dextrose 50 % solution 0-50 mL .  lactated ringers infusion .  lactulose (CHRONULAC) 10 GM/15ML solution 30 g .  sodium bicarbonate tablet 650 mg  No current outpatient medications on file.  Anti-infectives (From admission, onward)   None      Scheduled Meds: . amLODipine  10 mg Oral Daily  . aspirin EC  81 mg Oral Daily  . atorvastatin  40 mg Oral Daily  . hydrALAZINE  100 mg Oral TID  . insulin aspart  0-15 Units Subcutaneous TID WC  . insulin aspart  0-5 Units Subcutaneous QHS  . insulin aspart  5 Units Subcutaneous TID  WC  . insulin glargine  15 Units Subcutaneous Daily  . lactulose  30 g Oral Once  . levothyroxine  25 mcg Oral QAC breakfast  . sodium bicarbonate  650 mg Oral BID  . cyanocobalamin  1,000 mcg Oral Daily   Continuous Infusions: . sodium chloride 75 mL/hr at 08/17/20 1737  . dextrose 5% lactated ringers Stopped (08/15/20 0900)  . insulin Stopped (08/15/20 0815)  . lactated ringers Stopped (08/15/20 0249)     LOS: 3 days    Para Skeans, MD [facility] Pager [phone] If 7PM-7AM, please contact night-coverage [email or web address] Password [facility] 08/17/2020, 6:49 PM

## 2020-08-18 ENCOUNTER — Emergency Department (HOSPITAL_COMMUNITY): Payer: Medicare Other

## 2020-08-18 ENCOUNTER — Other Ambulatory Visit: Payer: Self-pay

## 2020-08-18 ENCOUNTER — Inpatient Hospital Stay (HOSPITAL_COMMUNITY)
Admission: EM | Admit: 2020-08-18 | Discharge: 2020-08-20 | DRG: 070 | Disposition: A | Discharge status: Home / Self Care | Payer: Medicare Other | Attending: Internal Medicine | Admitting: Internal Medicine

## 2020-08-18 ENCOUNTER — Encounter (HOSPITAL_COMMUNITY): Payer: Self-pay

## 2020-08-18 DIAGNOSIS — Z8249 Family history of ischemic heart disease and other diseases of the circulatory system: Secondary | ICD-10-CM

## 2020-08-18 DIAGNOSIS — Z7982 Long term (current) use of aspirin: Secondary | ICD-10-CM

## 2020-08-18 DIAGNOSIS — Z515 Encounter for palliative care: Secondary | ICD-10-CM

## 2020-08-18 DIAGNOSIS — E538 Deficiency of other specified B group vitamins: Secondary | ICD-10-CM | POA: Diagnosis present

## 2020-08-18 DIAGNOSIS — Z96641 Presence of right artificial hip joint: Secondary | ICD-10-CM | POA: Diagnosis present

## 2020-08-18 DIAGNOSIS — E1110 Type 2 diabetes mellitus with ketoacidosis without coma: Secondary | ICD-10-CM | POA: Diagnosis present

## 2020-08-18 DIAGNOSIS — N184 Chronic kidney disease, stage 4 (severe): Secondary | ICD-10-CM | POA: Diagnosis present

## 2020-08-18 DIAGNOSIS — Z83438 Family history of other disorder of lipoprotein metabolism and other lipidemia: Secondary | ICD-10-CM

## 2020-08-18 DIAGNOSIS — G9340 Encephalopathy, unspecified: Secondary | ICD-10-CM

## 2020-08-18 DIAGNOSIS — E1122 Type 2 diabetes mellitus with diabetic chronic kidney disease: Secondary | ICD-10-CM | POA: Diagnosis present

## 2020-08-18 DIAGNOSIS — E109 Type 1 diabetes mellitus without complications: Secondary | ICD-10-CM

## 2020-08-18 DIAGNOSIS — Z87891 Personal history of nicotine dependence: Secondary | ICD-10-CM

## 2020-08-18 DIAGNOSIS — E1022 Type 1 diabetes mellitus with diabetic chronic kidney disease: Secondary | ICD-10-CM

## 2020-08-18 DIAGNOSIS — J690 Pneumonitis due to inhalation of food and vomit: Secondary | ICD-10-CM

## 2020-08-18 DIAGNOSIS — Z20822 Contact with and (suspected) exposure to covid-19: Secondary | ICD-10-CM | POA: Diagnosis present

## 2020-08-18 DIAGNOSIS — Z820 Family history of epilepsy and other diseases of the nervous system: Secondary | ICD-10-CM

## 2020-08-18 DIAGNOSIS — Z79899 Other long term (current) drug therapy: Secondary | ICD-10-CM

## 2020-08-18 DIAGNOSIS — Z79890 Hormone replacement therapy (postmenopausal): Secondary | ICD-10-CM

## 2020-08-18 DIAGNOSIS — I129 Hypertensive chronic kidney disease with stage 1 through stage 4 chronic kidney disease, or unspecified chronic kidney disease: Secondary | ICD-10-CM | POA: Diagnosis present

## 2020-08-18 DIAGNOSIS — Z794 Long term (current) use of insulin: Secondary | ICD-10-CM

## 2020-08-18 DIAGNOSIS — D638 Anemia in other chronic diseases classified elsewhere: Secondary | ICD-10-CM | POA: Diagnosis present

## 2020-08-18 DIAGNOSIS — R0603 Acute respiratory distress: Secondary | ICD-10-CM

## 2020-08-18 DIAGNOSIS — N185 Chronic kidney disease, stage 5: Secondary | ICD-10-CM

## 2020-08-18 DIAGNOSIS — D631 Anemia in chronic kidney disease: Secondary | ICD-10-CM | POA: Diagnosis present

## 2020-08-18 DIAGNOSIS — Z66 Do not resuscitate: Secondary | ICD-10-CM | POA: Diagnosis present

## 2020-08-18 DIAGNOSIS — G9341 Metabolic encephalopathy: Principal | ICD-10-CM | POA: Diagnosis present

## 2020-08-18 DIAGNOSIS — F0390 Unspecified dementia without behavioral disturbance: Secondary | ICD-10-CM | POA: Diagnosis present

## 2020-08-18 DIAGNOSIS — Z8673 Personal history of transient ischemic attack (TIA), and cerebral infarction without residual deficits: Secondary | ICD-10-CM

## 2020-08-18 LAB — CBC WITH DIFFERENTIAL/PLATELET
Abs Immature Granulocytes: 0.03 10*3/uL (ref 0.00–0.07)
Basophils Absolute: 0.1 10*3/uL (ref 0.0–0.1)
Basophils Relative: 1 %
Eosinophils Absolute: 0.3 10*3/uL (ref 0.0–0.5)
Eosinophils Relative: 3 %
HCT: 26.3 % — ABNORMAL LOW (ref 39.0–52.0)
Hemoglobin: 8.1 g/dL — ABNORMAL LOW (ref 13.0–17.0)
Immature Granulocytes: 0 %
Lymphocytes Relative: 15 %
Lymphs Abs: 1.3 10*3/uL (ref 0.7–4.0)
MCH: 30.6 pg (ref 26.0–34.0)
MCHC: 30.8 g/dL (ref 30.0–36.0)
MCV: 99.2 fL (ref 80.0–100.0)
Monocytes Absolute: 1.1 10*3/uL — ABNORMAL HIGH (ref 0.1–1.0)
Monocytes Relative: 13 %
Neutro Abs: 5.9 10*3/uL (ref 1.7–7.7)
Neutrophils Relative %: 68 %
Platelets: 261 10*3/uL (ref 150–400)
RBC: 2.65 MIL/uL — ABNORMAL LOW (ref 4.22–5.81)
RDW: 14.1 % (ref 11.5–15.5)
WBC: 8.7 10*3/uL (ref 4.0–10.5)
nRBC: 0 % (ref 0.0–0.2)

## 2020-08-18 LAB — COMPREHENSIVE METABOLIC PANEL
ALT: 12 U/L (ref 0–44)
AST: 24 U/L (ref 15–41)
Albumin: 2.2 g/dL — ABNORMAL LOW (ref 3.5–5.0)
Alkaline Phosphatase: 45 U/L (ref 38–126)
Anion gap: 10 (ref 5–15)
BUN: 73 mg/dL — ABNORMAL HIGH (ref 8–23)
CO2: 20 mmol/L — ABNORMAL LOW (ref 22–32)
Calcium: 7.7 mg/dL — ABNORMAL LOW (ref 8.9–10.3)
Chloride: 109 mmol/L (ref 98–111)
Creatinine, Ser: 6.01 mg/dL — ABNORMAL HIGH (ref 0.61–1.24)
GFR, Estimated: 9 mL/min — ABNORMAL LOW (ref >60)
Glucose, Bld: 92 mg/dL (ref 70–99)
Potassium: 4.2 mmol/L (ref 3.5–5.1)
Sodium: 139 mmol/L (ref 135–145)
Total Bilirubin: <0.1 mg/dL — ABNORMAL LOW (ref 0.3–1.2)
Total Protein: 5.6 g/dL — ABNORMAL LOW (ref 6.5–8.1)

## 2020-08-18 LAB — I-STAT ARTERIAL BLOOD GAS, ED
Acid-base deficit: 2 mmol/L (ref 0.0–2.0)
Bicarbonate: 24.1 mmol/L (ref 20.0–28.0)
Calcium, Ion: 1.12 mmol/L — ABNORMAL LOW (ref 1.15–1.40)
HCT: 26 % — ABNORMAL LOW (ref 39.0–52.0)
Hemoglobin: 8.8 g/dL — ABNORMAL LOW (ref 13.0–17.0)
O2 Saturation: 79 %
Patient temperature: 98.8
Potassium: 4.4 mmol/L (ref 3.5–5.1)
Sodium: 139 mmol/L (ref 135–145)
TCO2: 25 mmol/L (ref 22–32)
pCO2 arterial: 44.6 mmHg (ref 32.0–48.0)
pH, Arterial: 7.341 — ABNORMAL LOW (ref 7.350–7.450)
pO2, Arterial: 46 mmHg — ABNORMAL LOW (ref 83.0–108.0)

## 2020-08-18 LAB — GLUCOSE, CAPILLARY
Glucose-Capillary: 133 mg/dL — ABNORMAL HIGH (ref 70–99)
Glucose-Capillary: 161 mg/dL — ABNORMAL HIGH (ref 70–99)
Glucose-Capillary: 56 mg/dL — ABNORMAL LOW (ref 70–99)

## 2020-08-18 LAB — CBC
HCT: 26.5 % — ABNORMAL LOW (ref 39.0–52.0)
Hemoglobin: 8.5 g/dL — ABNORMAL LOW (ref 13.0–17.0)
MCH: 30.2 pg (ref 26.0–34.0)
MCHC: 32.1 g/dL (ref 30.0–36.0)
MCV: 94.3 fL (ref 80.0–100.0)
Platelets: 282 10*3/uL (ref 150–400)
RBC: 2.81 MIL/uL — ABNORMAL LOW (ref 4.22–5.81)
RDW: 13.9 % (ref 11.5–15.5)
WBC: 7.1 10*3/uL (ref 4.0–10.5)
nRBC: 0 % (ref 0.0–0.2)

## 2020-08-18 LAB — OCCULT BLOOD X 1 CARD TO LAB, STOOL: Fecal Occult Bld: NEGATIVE

## 2020-08-18 LAB — CBG MONITORING, ED: Glucose-Capillary: 127 mg/dL — ABNORMAL HIGH (ref 70–99)

## 2020-08-18 NOTE — Discharge Summary (Signed)
Physician Discharge Summary  [name] [name] EXN:[id number] DOB: 28-Nov-1945 DOA: 08/14/2020  PCP: Libby Maw, MD  Admit date: 08/14/2020 Discharge date: 08/18/2020   Discharge Diagnoses:  Principal Problem:   Acute renal failure superimposed on stage 4 chronic kidney disease ([place]) Active Problems:   Hyperglycemia   Acute metabolic encephalopathy   Anemia   Diabetes mellitus type I ([place])   AKI (acute kidney injury) ([place])   Discharge Condition:  Stable Diet recommendation:  Renal ,carbohydrate consistent diet.    Filed Weights   08/14/20 1700 08/17/20 0357  Weight: 66.7 kg 78.4 kg    Discharge activity: As tolerated with RW per PT recommendation that pt refused.    History of present illness:  [name] [name] a 74 y.o.malewith medical history significant ofbrittle and uncontrolled insulin-dependent diabetes, CKD stage IV, chronic diastolic CHF, history of CVA with cognitive dysfunction, hypertension, hyperlipidemia, hypothyroidism presenting to the ED via EMS for evaluation of altered mental status. Per EMS, patient sister told them that prior to patient becoming altered he ate a lot of donuts. When EMS arrived his CBG was in the 500s. Patient was groggy but able to answer questions. He was brought to the hospital for further evaluation. Family reported patient having dyspnea on exertion and lower extremity edema despite taking his Lasix. History provided by patient limited as he appears confused. Reports having intermittent shortness of breath, unclear for how long and better it is exertional or at rest. He has no other complaints. Denies fevers, rhinorrhea, nasal congestion, sinus pain/pressure, cough, chest pain, nausea, vomiting, abdominal pain, diarrhea, or dysuria.  ED Course:Afebrile. Not tachycardic. Hypotensive with systolic in the 49Q to 75F. Not tachypneic or hypoxic. WBC 6.7, hemoglobin 6.8, hematocrit 22.5, platelet 260k.Sodium 138, potassium 3.2,  chloride 101, bicarb 18, anion gap 19, BUN 69, creatinine 7.6, glucose 566,corrected calcium 8.9, LFTs normal. Blood ethanol level undetectable. Ammonia level normal. UDS pending. Initial high-sensitivity troponin mildly elevated at 42, repeat pending. EKG without acute ischemic changes. BNP 178. VBG with pH 7.40. Screening SARS-CoV-2 PCR test and influenza panel pending. Chest x-ray showing no acute process. Head CT negative for acute intracranial abnormality. Patient received NovoLog 10 units. 2 units PRBCs ordered.   Hospital Course:  Pt seen for past two days and we were waiting on his fobt which I did today and is negative. We were also monitoring his creatinine which has improved.  Pt sees palliative care as he is not a dialysis candidate per sisters reports.  D/W sister today about d/c plan and need to followup with : PCP - 1 week for hospital followup . GI-Anemia eval. Nephrology -for f/u for hi CKD.   Procedures: None Consultations: PT   Discharge Exam: Vitals:   08/18/20 1425 08/18/20 1703  BP: (!) 149/80 134/79  Pulse: 89 88  Resp: 19 19  Temp: 98.8 F (37.1 C) 98.6 F (37 C)  SpO2: 98% 98%    Physical Exam Vitals and nursing note reviewed.  Constitutional:      Appearance: Normal appearance.  HENT:     Head: Normocephalic and atraumatic.     Right Ear: External ear normal.     Left Ear: External ear normal.     Nose: Nose normal.     Mouth/Throat:     Pharynx: Oropharynx is clear.  Eyes:     Extraocular Movements: Extraocular movements intact.  Cardiovascular:     Rate and Rhythm: Normal rate and regular rhythm.     Pulses: Normal pulses.  Heart sounds: Normal heart sounds.  Pulmonary:     Effort: Pulmonary effort is normal.     Breath sounds: Normal breath sounds.  Abdominal:     Palpations: Abdomen is soft.  Musculoskeletal:     Right lower leg: 1+ Pitting Edema present.     Left lower leg: 1+ Pitting Edema present.  Skin:     General: Skin is warm and dry.  Neurological:     General: No focal deficit present.     Mental Status: He is alert and oriented to person, place, and time. Mental status is at baseline.  Psychiatric:        Mood and Affect: Mood normal.      Discharge Instructions   Discharge Instructions    Call MD for:  persistant nausea and vomiting   Complete by: As directed    Call MD for:  temperature >100.4   Complete by: As directed    Diet - low sodium heart healthy   Complete by: As directed    Discharge instructions   Complete by: As directed    Please make GI appt for anemia.  Please followup with nephrology dr. Candiss Norse. Please followup with PCP in one week for hospital followup.   Increase activity slowly   Complete by: As directed      Allergies as of 08/18/2020   No Known Allergies     Medication List    TAKE these medications   Accu-Chek Guide w/Device Kit 1 Device by Does not apply route QID.   acetaminophen 325 MG tablet Commonly known as: Tylenol Take 2 tablets (650 mg total) by mouth every 6 (six) hours as needed for up to 30 doses for mild pain or moderate pain.   amLODipine 10 MG tablet Commonly known as: NORVASC Take 1 tablet (10 mg total) by mouth daily.   aspirin 81 MG EC tablet Take 1 tablet (81 mg total) by mouth daily. Swallow whole.   atorvastatin 40 MG tablet Commonly known as: LIPITOR Take 1 tablet (40 mg total) by mouth daily.   Basaglar KwikPen 100 UNIT/ML Inject 6 Units into the skin daily. What changed:   how much to take  when to take this   cyanocobalamin 1000 MCG tablet Take 1 tablet (1,000 mcg total) by mouth daily.   furosemide 40 MG tablet Commonly known as: LASIX Take 40 mg by mouth 2 (two) times daily. What changed: Another medication with the same name was removed. Continue taking this medication, and follow the directions you see here.   glucose blood test strip Commonly known as: True Metrix Blood Glucose Test CHECK  BLOOD SUGAR UP TO 4 TIMES A DAY. What changed:   how much to take  how to take this  when to take this  additional instructions   glucose blood test strip Commonly known as: Accu-Chek Guide Use as instructed What changed: Another medication with the same name was changed. Make sure you understand how and when to take each.   Accu-Chek Guide test strip Generic drug: glucose blood USE AS DIRECTED What changed: Another medication with the same name was changed. Make sure you understand how and when to take each.   hydrALAZINE 100 MG tablet Commonly known as: APRESOLINE TAKE 1 TABLET BY MOUTH 3 TIMES A DAY.   insulin lispro 100 UNIT/ML KwikPen Commonly known as: HumaLOG KwikPen Max daily 30 units What changed:   how much to take  how to take this  when to take this  reasons to take this   Insulin Pen Needle 32G X 4 MM Misc 1 Device by Does not apply route in the morning, at noon, in the evening, and at bedtime.   Insulin Syringe-Needle U-100 31G X 5/16" 0.3 ML Misc Commonly known as: BD Insulin Syringe U/F Inject 1 each as directed 2 (two) times daily. as directed   levothyroxine 25 MCG tablet Commonly known as: SYNTHROID Take 1 tablet (25 mcg total) by mouth daily before breakfast.   sodium bicarbonate 650 MG tablet Take 1 tablet (650 mg total) by mouth 2 (two) times daily.   Vitamin D (Ergocalciferol) 1.25 MG (50000 UNIT) Caps capsule Commonly known as: DRISDOL Take 50,000 Units by mouth every 7 (seven) days. Thursdays      No Known Allergies  Follow-up Information    Libby Maw, MD. Call in 1 week(s).   Specialty: Family Medicine Why: [facility] FOLLOWUP APPT.   Contact information: [place] [address] [phone]        Wilford Corner, MD. Call in 1 week(s).   Specialty: Gastroenterology Why: New referral for Anemia ? Gi source. Contact information: [address] [place] [address] [phone]                The results of significant diagnostics from this hospitalization (including imaging, microbiology, ancillary and laboratory) are listed below for reference.    Significant Diagnostic Studies: CT HEAD WO CONTRAST  Result Date: 08/14/2020 CLINICAL DATA:  Altered mental status. EXAM: CT HEAD WITHOUT CONTRAST TECHNIQUE: Contiguous axial images were obtained from the base of the skull through the vertex without intravenous contrast. COMPARISON:  Head CT 04/14/2020 FINDINGS: Brain: Again seen generalized atrophy and moderate chronic small vessel ischemia. Small chronic lacunar infarct in the subcortical white matter of the posterior right frontal lobe. No intracranial hemorrhage, mass effect, or midline shift. No hydrocephalus. The basilar cisterns are patent. No evidence of territorial infarct or acute ischemia. No extra-axial or intracranial fluid collection. Vascular: Atherosclerosis of skullbase vasculature without hyperdense vessel or abnormal calcification. Skull: No fracture or focal lesion. Sinuses/Orbits: There is mucosal thickening involving ethmoid air cells, maxillary sinuses, and sphenoid sinuses. Fluid levels involving the left greater than right sphenoid sinus. Mastoid air cells are clear. Other: None. IMPRESSION: 1. No acute intracranial abnormality. 2. Stable atrophy and chronic small vessel ischemia. 3. Paranasal sinus disease with fluid levels in the sphenoid sinus, may represent acute sinusitis. Electronically Signed   By: Keith Rake M.D.   On: 08/14/2020 18:39   US RENAL  Result Date: 08/14/2020 CLINICAL DATA:  Initial evaluation for acute kidney injury. EXAM: RENAL / URINARY TRACT ULTRASOUND COMPLETE COMPARISON:  Prior ultrasound from 10/03/2018. FINDINGS: Right Kidney: Renal measurements: 9.7 x 4.1 x 4.2 cm = volume: 85 mL. Increased echogenicity within the renal parenchyma. No nephrolithiasis or hydronephrosis. No focal  renal mass. Left Kidney: Renal measurements: 8.7 x 4.4 x 4.1 cm = volume: 82 mL. Left kidney somewhat difficult to visualize due to shadowing from overlying bowel gas. Visualized portions demonstrate increased echogenicity within the renal parenchyma. No visible nephrolithiasis or hydronephrosis. No focal renal mass. Bladder: Appears normal for degree of bladder distention. Other: Incidental note made of a right pleural effusion. IMPRESSION: 1. Increased echogenicity within the renal parenchyma, compatible with medical renal disease. 2. No hydronephrosis or other acute abnormality. 3. Incidental right pleural effusion. Electronically Signed   By: Jeannine Boga M.D.   On:  08/14/2020 23:06   DG Chest Port 1 View  Result Date: 08/14/2020 CLINICAL DATA:  Dyspnea on exertion EXAM: PORTABLE CHEST 1 VIEW COMPARISON:  07/06/2020 FINDINGS: The heart size and mediastinal contours are within normal limits. Persistent mild elevation of the left hemidiaphragm. No pleural effusion. The visualized skeletal structures are unremarkable. IMPRESSION: No acute process in the chest. Electronically Signed   By: Macy Mis M.D.   On: 08/14/2020 17:58   ECHOCARDIOGRAM COMPLETE  Result Date: 08/15/2020    ECHOCARDIOGRAM REPORT   Patient Name:   JABRON WEESE Date of Exam: 08/15/2020 Medical Rec #:  [id number]   Height:       69.0 in Accession #:    [id number]  Weight:       147.0 lb Date of Birth:  [birth year]    BSA:          1.813 m Patient Age:    24 years    BP:           124/67 mmHg Patient Gender: M           HR:           74 bpm. Exam Location:  Inpatient Procedure: 2D Echo, Cardiac Doppler and Color Doppler Indications:    Elevated troponin  History:        Patient has prior history of Echocardiogram examinations, most                 recent 07/07/2016. CHF; Risk Factors:Diabetes, Hypertension and                 Dyslipidemia. CKD. H/O CVA.  Sonographer:    [name] Lefort RDCS (AE) Referring Phys: [id number] RAVI PAHWANI   IMPRESSIONS   1. Left ventricular ejection fraction, by estimation, is 50 to 55%. The left ventricle has low normal function. The left ventricle has no regional wall motion abnormalities. There is mild left ventricular hypertrophy. Left ventricular diastolic parameters were normal.   2. Right ventricular systolic function is normal. The right ventricular size is normal. There is mildly elevated pulmonary artery systolic pressure. The estimated right ventricular systolic pressure is 63.8 mmHg.   3. Left atrial size was mildly dilated.   4. The mitral valve is normal in structure. Trivial mitral valve regurgitation. No evidence of mitral stenosis.   5. The tricuspid valve is degenerative. Mild TR.   6. The aortic valve is abnormal. There is moderate calcification of the aortic valve. Aortic valve regurgitation is not visualized. Aortic valve sclerosis/calcification is present, without any evidence of aortic stenosis. Aortic valve mean gradient measures 9.0 mmHg.   7. The inferior vena cava is normal in size with <50% respiratory variability, suggesting right atrial pressure of 8 mmHg.   FINDINGS  Left Ventricle: Left ventricular ejection fraction, by estimation, is 50 to 55%. The left ventricle has low normal function. The left ventricle has no regional wall motion abnormalities. The left ventricular internal cavity size was normal in size. There is mild left ventricular hypertrophy. Left ventricular diastolic parameters were normal. Right Ventricle: The right ventricular size is normal. No increase in right ventricular wall thickness. Right ventricular systolic function is normal. There is mildly elevated pulmonary artery systolic pressure. The tricuspid regurgitant velocity is 2.63  m/s, and with an assumed right atrial pressure of 10 mmHg, the estimated right ventricular systolic pressure is 75.6 mmHg. Left Atrium: Left atrial size was mildly dilated. Right Atrium: Right atrial size was normal in size.  Pericardium: There is  no evidence of pericardial effusion. Mitral Valve: The mitral valve is normal in structure. Trivial mitral valve regurgitation. No evidence of mitral valve stenosis. MV peak gradient, 5.8 mmHg. The mean mitral valve gradient is 2.0 mmHg. Tricuspid Valve: The tricuspid valve is degenerative in appearance. Tricuspid valve regurgitation is mild . No evidence of tricuspid stenosis. Aortic Valve: The aortic valve is abnormal. There is moderate calcification of the aortic valve. Aortic valve regurgitation is not visualized. Mild to moderate aortic valve sclerosis/calcification is present, without any evidence of aortic stenosis. Aortic valve mean gradient measures 9.0 mmHg. Aortic valve peak gradient measures 14.4 mmHg. Aortic valve area, by VTI measures 1.31 cm. Pulmonic Valve: The pulmonic valve was not well visualized. Pulmonic valve regurgitation is not visualized. No evidence of pulmonic stenosis. Aorta: The aortic root is normal in size and structure. Venous: The inferior vena cava is normal in size with less than 50% respiratory variability, suggesting right atrial pressure of 8 mmHg. IAS/Shunts: No atrial level shunt detected by color flow Doppler.  LEFT VENTRICLE PLAX 2D LVIDd:         4.80 cm  Diastology LVIDs:         3.30 cm  LV e' medial:    9.14 cm/s LV PW:         1.40 cm  LV E/e' medial:  8.8 LV IVS:        1.30 cm  LV e' lateral:   8.16 cm/s LVOT diam:     1.80 cm  LV E/e' lateral: 9.9 LV SV:         58 LV SV Index:   32 LVOT Area:     2.54 cm  RIGHT VENTRICLE          IVC RV Basal diam:  2.80 cm  IVC diam: 2.00 cm TAPSE (M-mode): 2.4 cm LEFT ATRIUM             Index       RIGHT ATRIUM           Index LA diam:        4.10 cm 2.26 cm/m  RA Area:     15.40 cm LA Vol (A2C):   79.1 ml 43.64 ml/m RA Volume:   35.20 ml  19.42 ml/m LA Vol (A4C):   63.4 ml 34.98 ml/m LA Biplane Vol: 70.0 ml 38.62 ml/m  AORTIC VALVE AV Area (Vmax):    1.41 cm AV Area (Vmean):   1.34 cm AV Area  (VTI):     1.31 cm AV Vmax:           190.00 cm/s AV Vmean:          143.000 cm/s AV VTI:            0.441 m AV Peak Grad:      14.4 mmHg AV Mean Grad:      9.0 mmHg LVOT Vmax:         105.00 cm/s LVOT Vmean:        75.500 cm/s LVOT VTI:          0.227 m LVOT/AV VTI ratio: 0.51  AORTA Ao Root diam: 3.30 cm Ao Asc diam:  3.30 cm MITRAL VALVE               TRICUSPID VALVE MV Area (PHT): 2.95 cm    TR Peak grad:   27.7 mmHg MV Peak grad:  5.8 mmHg    TR Vmax:  263.00 cm/s MV Mean grad:  2.0 mmHg MV Vmax:       1.20 m/s    SHUNTS MV Vmean:      58.0 cm/s   Systemic VTI:  0.23 m MV Decel Time: 257 msec    Systemic Diam: 1.80 cm MV E velocity: 80.60 cm/s MV A velocity: 75.80 cm/s MV E/A ratio:  1.06 Cherlynn Kaiser MD Electronically signed by Cherlynn Kaiser MD Signature Date/Time: 08/15/2020/11:21:54 PM    Final     Microbiology: Recent Results (from the past 240 hour(s))  Respiratory Panel by RT PCR (Flu A&B, Covid) - Nasopharyngeal Swab     Status: None   Collection Time: 08/14/20  8:18 PM   Specimen: Nasopharyngeal Swab  Result Value Ref Range Status   SARS Coronavirus 2 by RT PCR NEGATIVE NEGATIVE Final    Comment: (NOTE) SARS-CoV-2 target nucleic acids are NOT DETECTED.  The SARS-CoV-2 RNA is generally detectable in upper respiratoy specimens during the acute phase of infection. The lowest concentration of SARS-CoV-2 viral copies this assay can detect is 131 copies/mL. A negative result does not preclude SARS-Cov-2 infection and should not be used as the sole basis for treatment or other patient management decisions. A negative result may occur with  improper specimen collection/handling, submission of specimen other than nasopharyngeal swab, presence of viral mutation(s) within the areas targeted by this assay, and inadequate number of viral copies (<131 copies/mL). A negative result must be combined with clinical observations, patient history, and epidemiological information.  The expected result is Negative.  Fact Sheet for Patients:  PinkCheek.be  Fact Sheet for Healthcare Providers:  GravelBags.it  This test is no t yet approved or cleared by the [place] FDA and  has been authorized for detection and/or diagnosis of SARS-CoV-2 by FDA under an Emergency Use Authorization (EUA). This EUA will remain  in effect (meaning this test can be used) for the duration of the COVID-19 declaration under Section 564(b)(1) of the Act, 21 U.S.C. section 360bbb-3(b)(1), unless the authorization is terminated or revoked sooner.     Influenza A by PCR NEGATIVE NEGATIVE Final   Influenza B by PCR NEGATIVE NEGATIVE Final    Comment: (NOTE) The Xpert Xpress SARS-CoV-2/FLU/RSV assay is intended as an aid in  the diagnosis of influenza from Nasopharyngeal swab specimens and  should not be used as a sole basis for treatment. Nasal washings and  aspirates are unacceptable for Xpert Xpress SARS-CoV-2/FLU/RSV  testing.  Fact Sheet for Patients: PinkCheek.be  Fact Sheet for Healthcare Providers: GravelBags.it  This test is not yet approved or cleared by the [place] FDA and  has been authorized for detection and/or diagnosis of SARS-CoV-2 by  FDA under an Emergency Use Authorization (EUA). This EUA will remain  in effect (meaning this test can be used) for the duration of the  Covid-19 declaration under Section 564(b)(1) of the Act, 21  U.S.C. section 360bbb-3(b)(1), unless the authorization is  terminated or revoked. Performed at [facility], [place] [address] [place], [place] [address]      Labs: Basic Metabolic Panel: Recent Labs  Lab 08/14/20 1712 08/14/20 1853 08/14/20 2201 08/15/20 0642 08/16/20 0158 08/17/20 0126  NA 138 137  --  141 141 138  K 3.2* 3.0*  --  3.2* 3.7 4.3  CL 101  --   --  104 108 107  CO2 18*  --   --  23  23 20*  GLUCOSE 566*  --   --  128* 98 134*  BUN 69*  --   --  69* 68* 68*  CREATININE 7.66*  --   --  7.08* 6.82* 6.31*  CALCIUM 7.5*  --   --  7.6* 7.7* 7.6*  MG  --   --  2.2  --   --   --    Liver Function Tests: Recent Labs  Lab 08/14/20 1712  AST 24  ALT 14  ALKPHOS 53  BILITOT 1.0  PROT 5.5*  ALBUMIN 2.3*   No results for input(s): LIPASE, AMYLASE in the last 168 hours. Recent Labs  Lab 08/14/20 1712  AMMONIA 26   CBC: Recent Labs  Lab 08/14/20 1712 08/14/20 1712 08/14/20 1853 08/15/20 0642 08/16/20 0158 08/17/20 0126 08/18/20 1739  WBC 6.7  --   --   --  6.3 6.4 7.1  NEUTROABS 5.2  --   --   --  3.2 3.6  --   HGB 6.8*   < > 7.5* 6.8* 8.5* 8.8* 8.5*  HCT 22.5*   < > 22.0* 21.5* 26.4* 27.5* 26.5*  MCV 98.7  --   --   --  94.3 93.9 94.3  PLT 260  --   --   --  283 279 282   < > = values in this interval not displayed.   Cardiac Enzymes: No results for input(s): CKTOTAL, CKMB, CKMBINDEX, TROPONINI in the last 168 hours. BNP: BNP (last 3 results) Recent Labs    08/14/20 1713  BNP 178.6*    ProBNP (last 3 results) No results for input(s): PROBNP in the last 8760 hours.  CBG: Recent Labs  Lab 08/17/20 2147 08/17/20 2333 08/18/20 0814 08/18/20 1056 08/18/20 1701  GLUCAP 65* 120* 133* 161* 56*      Time spent: 15 minutes  Signed:  Para Skeans MD.  [facility] 08/18/2020, 6:50 PM

## 2020-08-18 NOTE — Progress Notes (Signed)
Phlebotomist was unable to draw blood due to poor vascular access.  Dr Posey Pronto made aware.  Idolina Primer, RN

## 2020-08-18 NOTE — ED Notes (Signed)
Pt transported to radiology at this time °

## 2020-08-18 NOTE — Evaluation (Signed)
Physical Therapy Evaluation Patient Details Name: Saveon Plant MRN: [id number] DOB: 10/31/1945 Today's Date: 08/18/2020   History of Present Illness  Pt adm with DKA, acute metabolic encephalopathy, and acute renal failure. PMH - DM, htn, CKD 4, history of DVT, history of CVA, EtOH use  Clinical Impression  Pt presents to PT with deficits in functional mobility, gait, balance, power, endurance, strength, cognition, safety awareness. Pt requires assistance to power up into standing. Pt does demonstrate some improvement in safety awareness this evaluation, electing to use a RW for ambulation without PT cueing. Pt has poor awareness of how his current strength deficits will affect his mobility at home, needing assistance to stand at this time but feeling he will be able to do so independently from a lower surface at home. Pt will benefit from continued acute PT services to improve strength and mobility quality. PT recommends SNF placement, however refusing all post-acute PT care at this time, including home health. Pt also refusing recommendation of a RW. Pt has refused all PT and DME services multiple times in the past and is likely continue to do so.    Follow Up Recommendations SNF (pt refusing all post-discharge PT)    Equipment Recommendations  Rolling walker with 5" wheels (pt refusing all DME)    Recommendations for Other Services       Precautions / Restrictions Precautions Precautions: Fall Restrictions Weight Bearing Restrictions: No      Mobility  Bed Mobility Overal bed mobility: Modified Independent             General bed mobility comments: HOB elevated, increased time    Transfers Overall transfer level: Needs assistance Equipment used: Rolling walker (2 wheeled) Transfers: Sit to/from Stand Sit to Stand: Min assist;From elevated surface         General transfer comment: pt requires minA to power up into standing, multiple attempts from lower surface without  assistance prior to asking PT for help  Ambulation/Gait Ambulation/Gait assistance: Min guard Gait Distance (Feet): 70 Feet Assistive device: Rolling walker (2 wheeled) Gait Pattern/deviations: Step-to pattern;Trunk flexed Gait velocity: decr Gait velocity interpretation: <1.8 ft/sec, indicate of risk for recurrent falls General Gait Details: pt with short step to gait, increased trunk flexion, no significant loss of balance noted  Stairs            Wheelchair Mobility    Modified Rankin (Stroke Patients Only)       Balance Overall balance assessment: Needs assistance Sitting-balance support: No upper extremity supported;Feet supported Sitting balance-Leahy Scale: Good     Standing balance support: Single extremity supported;Bilateral upper extremity supported Standing balance-Leahy Scale: Poor Standing balance comment: reliant on UE support of RW                             Pertinent Vitals/Pain Pain Assessment: No/denies pain    Home Living Family/patient expects to be discharged to:: Private residence Living Arrangements: Parent Available Help at Discharge: Family;Available PRN/intermittently Type of Home: House Home Access: Elevator     Home Layout: One level Home Equipment: Walker - 2 wheels Additional Comments: Pt denies having rolling walker but per previous charts pt has had a rolling walker but refuses to use it.     Prior Function Level of Independence: Independent   Gait / Transfers Assistance Needed: pt reports he ambulates independently  ADL's / Homemaking Assistance Needed: pt reports independence with all ADLs  Hand Dominance   Dominant Hand: Right    Extremity/Trunk Assessment   Upper Extremity Assessment Upper Extremity Assessment: Overall WFL for tasks assessed    Lower Extremity Assessment Lower Extremity Assessment: Generalized weakness    Cervical / Trunk Assessment Cervical / Trunk Assessment: Normal   Communication   Communication: No difficulties  Cognition Arousal/Alertness: Awake/alert Behavior During Therapy: Impulsive Overall Cognitive Status: History of cognitive impairments - at baseline Area of Impairment: Attention;Memory;Safety/judgement;Problem solving                   Current Attention Level: Sustained Memory: Decreased short-term memory   Safety/Judgement: Decreased awareness of deficits;Decreased awareness of safety   Problem Solving: Requires verbal cues General Comments: pt with poor awareness of implication of his deficitas      General Comments General comments (skin integrity, edema, etc.): VSS on RA    Exercises     Assessment/Plan    PT Assessment Patient needs continued PT services  PT Problem List Decreased strength;Decreased activity tolerance;Decreased balance;Decreased mobility;Decreased knowledge of use of DME;Decreased safety awareness;Decreased knowledge of precautions       PT Treatment Interventions DME instruction;Gait training;Functional mobility training;Therapeutic activities;Therapeutic exercise;Balance training;Neuromuscular re-education;Cognitive remediation;Patient/family education    PT Goals (Current goals can be found in the Care Plan section)  Acute Rehab PT Goals Patient Stated Goal: To go home PT Goal Formulation: With patient Time For Goal Achievement: 09/01/20 Potential to Achieve Goals: Poor    Frequency Min 2X/week   Barriers to discharge        Co-evaluation               AM-PAC PT "6 Clicks" Mobility  Outcome Measure Help needed turning from your back to your side while in a flat bed without using bedrails?: None Help needed moving from lying on your back to sitting on the side of a flat bed without using bedrails?: None Help needed moving to and from a bed to a chair (including a wheelchair)?: A Little Help needed standing up from a chair using your arms (e.g., wheelchair or bedside chair)?: A  Little Help needed to walk in hospital room?: A Little Help needed climbing 3-5 steps with a railing? : A Lot 6 Click Score: 19    End of Session   Activity Tolerance: Patient tolerated treatment well Patient left: in bed;with call bell/phone within reach;with bed alarm set Nurse Communication: Mobility status PT Visit Diagnosis: Unsteadiness on feet (R26.81);Other abnormalities of gait and mobility (R26.89)    Time: 0677-0340 PT Time Calculation (min) (ACUTE ONLY): 14 min   Charges:   PT Evaluation $PT Eval Low Complexity: 1 Low          Zenaida Niece, PT, DPT Acute Rehabilitation Pager: [phone]   Zenaida Niece 08/18/2020, 4:49 PM

## 2020-08-18 NOTE — ED Notes (Signed)
Dr. Alvino Chapel was notified that nursing staff and IV team was unable to obtain blood specimens from patient.

## 2020-08-18 NOTE — H&P (Signed)
History and Physical    [name] [name] XTK:[id number] DOB: November 15, 1945 DOA: 08/18/2020  PCP: Libby Maw, MD   Patient coming from: Home   Chief Complaint: Decreased LOC   HPI: [name] [name] is a 74 y.o. male with medical history significant for chronic kidney disease stage V, insulin dependent diabetes mellitus, history of CVA, chronic anemia, dementia, and hypertension, now presenting to the emergency department with decreased level of consciousness.  Patient is unable to contribute much of the history which is obtained from ED personnel and the patient's sister with whom he lives.  Patient had just been admitted to the hospital with altered mental status, acute on chronic renal failure, and acute on chronic anemia, was discharged home just a few hours ago, and was found to have decreased level of consciousness.  SNF was recommended by PT during this recent admission with the patient refused this.  Patient's daughter also notes that the attending physician caring for him yesterday felt he may need longer hospital stay but the patient was adamant that he be discharged immediately.  Shortly after returning home, patient was eating a hamburger, was noted to have some coughing and choking, and then went down to his knees and was not responding to their questions.  They called EMS, and by time of EMS arrival the patient was speaking but having some difficulty breathing.  Ventilations were assisted by EMS and the patient was brought into the ED.  ED Course: Upon arrival to the ED, patient is found to be afebrile, saturating mid 90s on room air, and with stable blood pressure.  EKG features sinus rhythm with nonspecific IVCD.  Chest x-ray demonstrates an ill-defined opacity at the right base which is new.  Head CT is negative for acute intracranial abnormality.  ABG features a pH of 7.34, pCO2 45, pO2 46, and bicarbonate 25.  Chemistry panel and CBC are pending.  COVID-19 screening test is also pending at  this time.  Hospitalists were consulted for admission.  Review of Systems:  All other systems reviewed and apart from HPI, are negative.  Past Medical History:  Diagnosis Date  . 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 Closed comminuted intertrochanteric fracture of left femur ([place])   . Diabetes mellitus without complication ([place])   . Diabetic hyperosmolar non-ketotic state ([place]) 05/15/2016  . DKA (diabetic ketoacidoses) 05/09/2016  . Hypertension   . Hypoglycemia 11/29/2019  . Postoperative anemia due to acute blood loss 06/18/2016  . Retroperitoneal hematoma 06/18/2016  . Stroke ([place])   . Vitamin B 12 deficiency 06/18/2016    Past Surgical History:  Procedure Laterality Date  . HIP ARTHROPLASTY Right 02/05/2013   Procedure: ARTHROPLASTY BIPOLAR HIP;  Surgeon: [name], MD;  Location: [facility];  Service: Orthopedics;  Laterality: Right;  . INTRAMEDULLARY (IM) NAIL INTERTROCHANTERIC Left 06/16/2016   Procedure: INTRAMEDULLARY (IM) NAIL INTERTROCHANTRIC;  Surgeon: Rod Can, MD;  Location: [facility];  Service: Orthopedics;  Laterality: Left;    Social History:   reports that he quit smoking about 4 years ago. His smoking use included cigarettes. He has a 7.50 pack-year smoking history. He has never used smokeless tobacco. He reports that he does not drink alcohol and does not use drugs.  No Known Allergies  Family History  Problem Relation Age of Onset  . Alzheimer's disease Mother   . Hypertension Mother   . Hyperlipidemia Father   . Hypertension Father   . Healthy Maternal Grandmother   .  Pneumonia Maternal Grandfather      Prior to Admission medications   Medication Sig Start Date End Date Taking? Authorizing Provider  ACCU-CHEK GUIDE test strip USE AS DIRECTED 06/12/20   Azzie Glatter, [name]  acetaminophen (TYLENOL) 325 MG tablet Take 2 tablets (650 mg total) by mouth every 6 (six) hours as needed for up to 30 doses for  mild pain or moderate pain. 07/06/20   Wyvonnia Dusky, MD  amLODipine (NORVASC) 10 MG tablet Take 1 tablet (10 mg total) by mouth daily. 06/26/20   Libby Maw, MD  aspirin EC 81 MG EC tablet Take 1 tablet (81 mg total) by mouth daily. Swallow whole. Patient not taking: Reported on 08/14/2020 04/28/20   Barb Merino, MD  atorvastatin (LIPITOR) 40 MG tablet Take 1 tablet (40 mg total) by mouth daily. 06/26/20   Libby Maw, MD  Blood Glucose Monitoring Suppl (ACCU-CHEK GUIDE) w/Device KIT 1 Device by Does not apply route QID. 11/02/19   Azzie Glatter, [name]  furosemide (LASIX) 40 MG tablet Take 40 mg by mouth 2 (two) times daily. 07/30/20   [provider]  glucose blood (ACCU-CHEK GUIDE) test strip Use as instructed 11/03/18   Azzie Glatter, [name]  glucose blood (TRUE METRIX BLOOD GLUCOSE TEST) test strip CHECK BLOOD SUGAR UP TO 4 TIMES A DAY. Patient taking differently: 1 each by Other route See admin instructions. CHECK BLOOD SUGAR UP TO 2 TIMES A DAY. 05/25/18   Azzie Glatter, [name]  hydrALAZINE (APRESOLINE) 100 MG tablet TAKE 1 TABLET BY MOUTH 3 TIMES A DAY. 07/30/20   Libby Maw, MD  Insulin Glargine [facility]) 100 UNIT/ML Inject 6 Units into the skin daily. Patient taking differently: Inject 5 Units into the skin at bedtime.  07/26/20   [name], [name], MD  insulin lispro (HUMALOG KWIKPEN) 100 UNIT/ML KwikPen Max daily 30 units Patient taking differently: Inject 0-10 Units into the skin 3 (three) times daily as needed (blood sugar). Max daily 30 units 08/10/20   [name], [name], MD  Insulin Pen Needle 32G X 4 MM MISC 1 Device by Does not apply route in the morning, at noon, in the evening, and at bedtime. 01/03/20   [name], [name], MD  Insulin Syringe-Needle U-100 (BD INSULIN SYRINGE U/F) 31G X 5/16" 0.3 ML MISC Inject 1 each as directed 2 (two) times daily. as directed 11/02/19   Azzie Glatter, [name]   levothyroxine (SYNTHROID) 25 MCG tablet Take 1 tablet (25 mcg total) by mouth daily before breakfast. 06/26/20   Libby Maw, MD  sodium bicarbonate 650 MG tablet Take 1 tablet (650 mg total) by mouth 2 (two) times daily. 04/19/20   [name], MD  vitamin B-12 1000 MCG tablet Take 1 tablet (1,000 mcg total) by mouth daily. 12/05/19   [name], MD  Vitamin D, Ergocalciferol, (DRISDOL) 1.25 MG (50000 UNIT) CAPS capsule Take 50,000 Units by mouth every 7 (seven) days. Thursdays    [provider]    Physical Exam: Vitals:   08/18/20 2200 08/18/20 2215 08/18/20 2245 08/18/20 2335  BP: 139/73 128/89 (!) 146/74   Pulse: 78 89 83   Resp: '12 15 12   ' Temp:    97.9 F (36.6 C)  TempSrc:    Oral  SpO2: 95% 98% 97%     Constitutional: NAD, calm  Eyes: PERTLA, lids and conjunctivae normal ENMT: Mucous membranes are moist. Posterior pharynx clear of any exudate or lesions.  Neck: normal, supple, no masses, no thyromegaly Respiratory:  Rhonchi at bases, no wheezing. No accessory muscle use.  Cardiovascular: S1 & S2 heard, regular rate and rhythm. Pitting edema.  Abdomen: No distension, no tenderness, soft. Bowel sounds active.  Musculoskeletal: no clubbing / cyanosis. No joint deformity upper and lower extremities.   Skin: no significant rashes, lesions, ulcers. Warm, dry, well-perfused. Neurologic: No gross facial asymmetry. Dysarthria. Sensation intact. Moving all extremities.  Psychiatric: Sleeping, wakes to voice. Oriented to person only. Calm.     Labs and Imaging on Admission: I have personally reviewed following labs and imaging studies  CBC: Recent Labs  Lab 08/14/20 1712 08/14/20 1853 08/15/20 0642 08/16/20 0158 08/17/20 0126 08/18/20 1739 08/18/20 2305  WBC 6.7  --   --  6.3 6.4 7.1  --   NEUTROABS 5.2  --   --  3.2 3.6  --   --   HGB 6.8*   < > 6.8* 8.5* 8.8* 8.5* 8.8*  HCT 22.5*   < > 21.5* 26.4* 27.5* 26.5* 26.0*  MCV 98.7  --   --  94.3 93.9  94.3  --   PLT 260  --   --  283 279 282  --    < > = values in this interval not displayed.   Basic Metabolic Panel: Recent Labs  Lab 08/14/20 1712 08/14/20 1853 08/14/20 2201 08/15/20 6387 08/16/20 0158 08/17/20 0126 08/18/20 1739 08/18/20 2305  NA 138   < >  --  141 141 138 139 139  K 3.2*   < >  --  3.2* 3.7 4.3 4.2 4.4  CL 101  --   --  104 108 107 109  --   CO2 18*  --   --  23 23 20* 20*  --   GLUCOSE 566*  --   --  128* 98 134* 92  --   BUN 69*  --   --  69* 68* 68* 73*  --   CREATININE 7.66*  --   --  7.08* 6.82* 6.31* 6.01*  --   CALCIUM 7.5*  --   --  7.6* 7.7* 7.6* 7.7*  --   MG  --   --  2.2  --   --   --   --   --    < > = values in this interval not displayed.   GFR: Estimated Creatinine Clearance: 10.6 mL/min (A) (by C-G formula based on SCr of 6.01 mg/dL (H)). Liver Function Tests: Recent Labs  Lab 08/14/20 1712 08/18/20 1739  AST 24 24  ALT 14 12  ALKPHOS 53 45  BILITOT 1.0 <0.1*  PROT 5.5* 5.6*  ALBUMIN 2.3* 2.2*   No results for input(s): LIPASE, AMYLASE in the last 168 hours. Recent Labs  Lab 08/14/20 1712  AMMONIA 26   Coagulation Profile: No results for input(s): INR, PROTIME in the last 168 hours. Cardiac Enzymes: No results for input(s): CKTOTAL, CKMB, CKMBINDEX, TROPONINI in the last 168 hours. BNP (last 3 results) No results for input(s): PROBNP in the last 8760 hours. HbA1C: No results for input(s): HGBA1C in the last 72 hours. CBG: Recent Labs  Lab 08/17/20 2333 08/18/20 0814 08/18/20 1056 08/18/20 1701 08/18/20 2115  GLUCAP 120* 133* 161* 56* 127*   Lipid Profile: No results for input(s): CHOL, HDL, LDLCALC, TRIG, CHOLHDL, LDLDIRECT in the last 72 hours. Thyroid Function Tests: No results for input(s): TSH, T4TOTAL, FREET4, T3FREE, THYROIDAB in the last 72 hours. Anemia Panel: No  results for input(s): VITAMINB12, FOLATE, FERRITIN, TIBC, IRON, RETICCTPCT in the last 72 hours. Urine analysis:    Component Value  Date/Time   COLORURINE YELLOW 08/15/2020 0830   APPEARANCEUR CLEAR 08/15/2020 0830   LABSPEC 1.014 08/15/2020 0830   PHURINE 5.0 08/15/2020 0830   GLUCOSEU >=500 (A) 08/15/2020 0830   GLUCOSEU >=1000 (A) 12/08/2019 1139   HGBUR NEGATIVE 08/15/2020 0830   BILIRUBINUR NEGATIVE 08/15/2020 0830   BILIRUBINUR Negative 11/09/2019 0910   KETONESUR 5 (A) 08/15/2020 0830   PROTEINUR >=300 (A) 08/15/2020 0830   UROBILINOGEN 0.2 12/08/2019 1139   NITRITE NEGATIVE 08/15/2020 0830   LEUKOCYTESUR NEGATIVE 08/15/2020 0830   Sepsis Labs: '@LABRCNTIP' (procalcitonin:4,lacticidven:4) ) Recent Results (from the past 240 hour(s))  Respiratory Panel by RT PCR (Flu A&B, Covid) - Nasopharyngeal Swab     Status: None   Collection Time: 08/14/20  8:18 PM   Specimen: Nasopharyngeal Swab  Result Value Ref Range Status   SARS Coronavirus 2 by RT PCR NEGATIVE NEGATIVE Final    Comment: (NOTE) SARS-CoV-2 target nucleic acids are NOT DETECTED.  The SARS-CoV-2 RNA is generally detectable in upper respiratoy specimens during the acute phase of infection. The lowest concentration of SARS-CoV-2 viral copies this assay can detect is 131 copies/mL. A negative result does not preclude SARS-Cov-2 infection and should not be used as the sole basis for treatment or other patient management decisions. A negative result may occur with  improper specimen collection/handling, submission of specimen other than nasopharyngeal swab, presence of viral mutation(s) within the areas targeted by this assay, and inadequate number of viral copies (<131 copies/mL). A negative result must be combined with clinical observations, patient history, and epidemiological information. The expected result is Negative.  Fact Sheet for Patients:  PinkCheek.be  Fact Sheet for Healthcare Providers:  GravelBags.it  This test is no t yet approved or cleared by the [place] FDA and   has been authorized for detection and/or diagnosis of SARS-CoV-2 by FDA under an Emergency Use Authorization (EUA). This EUA will remain  in effect (meaning this test can be used) for the duration of the COVID-19 declaration under Section 564(b)(1) of the Act, 21 U.S.C. section 360bbb-3(b)(1), unless the authorization is terminated or revoked sooner.     Influenza A by PCR NEGATIVE NEGATIVE Final   Influenza B by PCR NEGATIVE NEGATIVE Final    Comment: (NOTE) The Xpert Xpress SARS-CoV-2/FLU/RSV assay is intended as an aid in  the diagnosis of influenza from Nasopharyngeal swab specimens and  should not be used as a sole basis for treatment. Nasal washings and  aspirates are unacceptable for Xpert Xpress SARS-CoV-2/FLU/RSV  testing.  Fact Sheet for Patients: PinkCheek.be  Fact Sheet for Healthcare Providers: GravelBags.it  This test is not yet approved or cleared by the [place] FDA and  has been authorized for detection and/or diagnosis of SARS-CoV-2 by  FDA under an Emergency Use Authorization (EUA). This EUA will remain  in effect (meaning this test can be used) for the duration of the  Covid-19 declaration under Section 564(b)(1) of the Act, 21  U.S.C. section 360bbb-3(b)(1), unless the authorization is  terminated or revoked. Performed at [facility], [place] [address], [place], [place] [address]      Radiological Exams on Admission: CT Head Wo Contrast  Result Date: 08/18/2020 CLINICAL DATA:  Altered mental status, found on floor EXAM: CT HEAD WITHOUT CONTRAST TECHNIQUE: Contiguous axial images were obtained from the base of the skull through the vertex without intravenous  contrast. COMPARISON:  CT 08/14/2020 FINDINGS: Brain: Few stable hypoattenuating foci in the subcortical white matter of the bilateral frontal lobes, likely to reflect areas of prior lacunar type infarcts. No evidence of acute infarction,  hemorrhage, hydrocephalus, extra-axial collection, visible mass lesion or mass effect. Prominence of the ventricles, cisterns and sulci compatible with moderate frontal predominant parenchymal volume loss. Patchy areas of white matter hypoattenuation are most compatible with chronic microvascular angiopathy. Vascular: Atherosclerotic calcification of the carotid siphons and intradural vertebral arteries. No hyperdense vessel. Skull: No calvarial fracture or suspicious osseous lesion. No scalp swelling or hematoma. Sinuses/Orbits: Mild mural thickening in the left maxillary and ethmoid sinuses. Subtotal opacification left sphenoid sinus with small bubbly lucency/air-fluid level. Included orbital structures are unremarkable. Other: None. IMPRESSION: 1. No acute intracranial abnormality. 2. Stable moderate frontal predominant parenchymal volume loss and chronic microvascular angiopathy. 3. Stable remote lacunar type infarcts in the subcortical white matter of the bilateral frontal lobes. 4. Layering air-fluid level in the left sphenoid sinus with more mild mural disease in maxillary and ethmoid sinuses, could reflect an acute on chronic sinusitis. Correlate with clinical symptoms. Electronically Signed   By: Lovena Le M.D.   On: 08/18/2020 23:19   DG Chest Port 1 View  Result Date: 08/18/2020 CLINICAL DATA:  Wheezing and altered mental status. EXAM: PORTABLE CHEST 1 VIEW COMPARISON:  08/14/2020 FINDINGS: Lower lung volumes from recent exam. Ill-defined opacity at the right lung base, new. Stable heart size and mediastinal contours. No pneumothorax. No large subpulmonic effusion. Trace perifissural scarring in the right lung, chronic. No pulmonary edema. No acute osseous abnormalities are seen. IMPRESSION: Ill-defined opacity at the right lung base, new from recent exam, may be atelectasis or pneumonia. Electronically Signed   By: Keith Rake M.D.   On: 08/18/2020 22:09    EKG: Independently reviewed.  Sinus rhythm, non-specific IVCD.   Assessment/Plan   1. Acute encephalopathy  - Presents with decreased LOC shortly after hospital discharge, had been choking on some food just prior to this and appears to have been aspirating  - Head CT is negative for acute findings  - ABG with normal pCO2 but low pO2  - Start supplemental O2, check TSH, chemistry panel, and ammonia, and continue supportive care and delirium precautions   2. Aspiration   - Presents with decreased LOC, had been choking and coughing while eating a hamburger just prior to this and is found to have new opacity at right base on CXR and pO2 of 46 on ABG  - He has been coughing and choking when eating for a long time per family  - Keep NPO pending SLP evaluation, start Unasyn, check sputum culture, trend procalcitonin    3. CKD V   - SCr is 6.15 on admission, had been closer to 5 a couple months ago   - Patient's family notes that [facility] outpatient nephrologist did not feel that he is a dialysis candidate and they are accepting of this  - Renally-dose medications, continue bicarbonate and Lasix    4. Dementia  - He is calm and cooperative in ED  - Continue supportive care, delirium precautions   5. Anemia  - Hgb is 8.1 on admission, down from 8.5 at time of recent discharge  - He was transfused RBCs a few days ago, had negative FOBT and anemia suspected secondary to advanced CKD  - Monitor and transfuse as needed    6. History of CVA - No acute findings on CT head  -  Continue statin   7. Insulin-dependent DM  - A1c was 10.0% last month  - Continue CBG checks and insulin    DVT prophylaxis: sq heparin  Code Status: DNR, confirmed on admission  Family Communication: Sister Jorden Minchey) updated by phone   Disposition Plan:  Patient is from: Home  Anticipated d/c is to: TBD Anticipated d/c date is: 08/19/20 Patient currently: Pending further workup of acute encephalopathy  Consults called: None  Admission  status: observation     Vianne Bulls, MD [facility]  08/18/2020, 11:48 PM

## 2020-08-18 NOTE — ED Notes (Signed)
IV attempt x 2 by Brittiany - Charge RN to right hand and left hand - Unsuccessful. Unsuccessful IV attempt to right AC by this nurse.

## 2020-08-18 NOTE — ED Triage Notes (Signed)
EMS reports pt lives with sister. Sister found him on floor with altered mental status. Breathing 6 bpm on arrival. Assisted venitlations and mental status improved. O2 sat in 90's on RA on arrival. CBG - 170.

## 2020-08-18 NOTE — ED Notes (Signed)
RT at bedside attempting to obtain blood gas.

## 2020-08-19 ENCOUNTER — Inpatient Hospital Stay (HOSPITAL_COMMUNITY): Payer: Medicare Other

## 2020-08-19 DIAGNOSIS — Z7982 Long term (current) use of aspirin: Secondary | ICD-10-CM | POA: Diagnosis not present

## 2020-08-19 DIAGNOSIS — E1122 Type 2 diabetes mellitus with diabetic chronic kidney disease: Secondary | ICD-10-CM | POA: Diagnosis present

## 2020-08-19 DIAGNOSIS — Z794 Long term (current) use of insulin: Secondary | ICD-10-CM | POA: Diagnosis not present

## 2020-08-19 DIAGNOSIS — Z79890 Hormone replacement therapy (postmenopausal): Secondary | ICD-10-CM | POA: Diagnosis not present

## 2020-08-19 DIAGNOSIS — G9341 Metabolic encephalopathy: Secondary | ICD-10-CM | POA: Diagnosis present

## 2020-08-19 DIAGNOSIS — Z820 Family history of epilepsy and other diseases of the nervous system: Secondary | ICD-10-CM | POA: Diagnosis not present

## 2020-08-19 DIAGNOSIS — Z7189 Other specified counseling: Secondary | ICD-10-CM | POA: Diagnosis not present

## 2020-08-19 DIAGNOSIS — Z20822 Contact with and (suspected) exposure to covid-19: Secondary | ICD-10-CM | POA: Diagnosis present

## 2020-08-19 DIAGNOSIS — G9340 Encephalopathy, unspecified: Secondary | ICD-10-CM | POA: Diagnosis present

## 2020-08-19 DIAGNOSIS — Z515 Encounter for palliative care: Secondary | ICD-10-CM | POA: Diagnosis not present

## 2020-08-19 DIAGNOSIS — I129 Hypertensive chronic kidney disease with stage 1 through stage 4 chronic kidney disease, or unspecified chronic kidney disease: Secondary | ICD-10-CM | POA: Diagnosis present

## 2020-08-19 DIAGNOSIS — F0391 Unspecified dementia with behavioral disturbance: Secondary | ICD-10-CM | POA: Diagnosis not present

## 2020-08-19 DIAGNOSIS — E1110 Type 2 diabetes mellitus with ketoacidosis without coma: Secondary | ICD-10-CM | POA: Diagnosis present

## 2020-08-19 DIAGNOSIS — J690 Pneumonitis due to inhalation of food and vomit: Secondary | ICD-10-CM | POA: Diagnosis present

## 2020-08-19 DIAGNOSIS — Z87891 Personal history of nicotine dependence: Secondary | ICD-10-CM | POA: Diagnosis not present

## 2020-08-19 DIAGNOSIS — Z83438 Family history of other disorder of lipoprotein metabolism and other lipidemia: Secondary | ICD-10-CM | POA: Diagnosis not present

## 2020-08-19 DIAGNOSIS — Z79899 Other long term (current) drug therapy: Secondary | ICD-10-CM | POA: Diagnosis not present

## 2020-08-19 DIAGNOSIS — D631 Anemia in chronic kidney disease: Secondary | ICD-10-CM | POA: Diagnosis present

## 2020-08-19 DIAGNOSIS — Z96641 Presence of right artificial hip joint: Secondary | ICD-10-CM | POA: Diagnosis present

## 2020-08-19 DIAGNOSIS — F0390 Unspecified dementia without behavioral disturbance: Secondary | ICD-10-CM | POA: Diagnosis present

## 2020-08-19 DIAGNOSIS — N184 Chronic kidney disease, stage 4 (severe): Secondary | ICD-10-CM | POA: Diagnosis present

## 2020-08-19 DIAGNOSIS — Z8673 Personal history of transient ischemic attack (TIA), and cerebral infarction without residual deficits: Secondary | ICD-10-CM | POA: Diagnosis not present

## 2020-08-19 DIAGNOSIS — Z66 Do not resuscitate: Secondary | ICD-10-CM | POA: Diagnosis present

## 2020-08-19 DIAGNOSIS — Z8249 Family history of ischemic heart disease and other diseases of the circulatory system: Secondary | ICD-10-CM | POA: Diagnosis not present

## 2020-08-19 DIAGNOSIS — E538 Deficiency of other specified B group vitamins: Secondary | ICD-10-CM | POA: Diagnosis present

## 2020-08-19 LAB — CBC
HCT: 24.1 % — ABNORMAL LOW (ref 39.0–52.0)
Hemoglobin: 7.5 g/dL — ABNORMAL LOW (ref 13.0–17.0)
MCH: 29.9 pg (ref 26.0–34.0)
MCHC: 31.1 g/dL (ref 30.0–36.0)
MCV: 96 fL (ref 80.0–100.0)
Platelets: 239 10*3/uL (ref 150–400)
RBC: 2.51 MIL/uL — ABNORMAL LOW (ref 4.22–5.81)
RDW: 14.1 % (ref 11.5–15.5)
WBC: 5.8 10*3/uL (ref 4.0–10.5)
nRBC: 0 % (ref 0.0–0.2)

## 2020-08-19 LAB — BRAIN NATRIURETIC PEPTIDE: B Natriuretic Peptide: 206.9 pg/mL — ABNORMAL HIGH (ref 0.0–100.0)

## 2020-08-19 LAB — COMPREHENSIVE METABOLIC PANEL
ALT: 12 U/L (ref 0–44)
AST: 24 U/L (ref 15–41)
Albumin: 2.1 g/dL — ABNORMAL LOW (ref 3.5–5.0)
Alkaline Phosphatase: 47 U/L (ref 38–126)
Anion gap: 9 (ref 5–15)
BUN: 76 mg/dL — ABNORMAL HIGH (ref 8–23)
CO2: 22 mmol/L (ref 22–32)
Calcium: 7.6 mg/dL — ABNORMAL LOW (ref 8.9–10.3)
Chloride: 107 mmol/L (ref 98–111)
Creatinine, Ser: 6.15 mg/dL — ABNORMAL HIGH (ref 0.61–1.24)
GFR, Estimated: 9 mL/min — ABNORMAL LOW (ref >60)
Glucose, Bld: 95 mg/dL (ref 70–99)
Potassium: 4.5 mmol/L (ref 3.5–5.1)
Sodium: 138 mmol/L (ref 135–145)
Total Bilirubin: 0.4 mg/dL (ref 0.3–1.2)
Total Protein: 5.5 g/dL — ABNORMAL LOW (ref 6.5–8.1)

## 2020-08-19 LAB — RESPIRATORY PANEL BY RT PCR (FLU A&B, COVID)
Influenza A by PCR: NEGATIVE
Influenza B by PCR: NEGATIVE
SARS Coronavirus 2 by RT PCR: NEGATIVE

## 2020-08-19 LAB — BASIC METABOLIC PANEL
Anion gap: 10 (ref 5–15)
BUN: 76 mg/dL — ABNORMAL HIGH (ref 8–23)
CO2: 20 mmol/L — ABNORMAL LOW (ref 22–32)
Calcium: 8 mg/dL — ABNORMAL LOW (ref 8.9–10.3)
Chloride: 109 mmol/L (ref 98–111)
Creatinine, Ser: 6.43 mg/dL — ABNORMAL HIGH (ref 0.61–1.24)
GFR, Estimated: 8 mL/min — ABNORMAL LOW (ref >60)
Glucose, Bld: 220 mg/dL — ABNORMAL HIGH (ref 70–99)
Potassium: 4.6 mmol/L (ref 3.5–5.1)
Sodium: 139 mmol/L (ref 135–145)

## 2020-08-19 LAB — GLUCOSE, CAPILLARY
Glucose-Capillary: 101 mg/dL — ABNORMAL HIGH (ref 70–99)
Glucose-Capillary: 60 mg/dL — ABNORMAL LOW (ref 70–99)
Glucose-Capillary: 96 mg/dL (ref 70–99)
Glucose-Capillary: 270 mg/dL — ABNORMAL HIGH (ref 70–99)
Glucose-Capillary: 225 mg/dL — ABNORMAL HIGH (ref 70–99)

## 2020-08-19 LAB — T4, FREE: Free T4: 0.73 ng/dL (ref 0.61–1.12)

## 2020-08-19 LAB — AMMONIA: Ammonia: 22 umol/L (ref 9–35)

## 2020-08-19 LAB — PROCALCITONIN
Procalcitonin: 0.61 ng/mL
Procalcitonin: 0.76 ng/mL

## 2020-08-19 LAB — CBG MONITORING, ED
Glucose-Capillary: 76 mg/dL (ref 70–99)
Glucose-Capillary: 57 mg/dL — ABNORMAL LOW (ref 70–99)
Glucose-Capillary: 135 mg/dL — ABNORMAL HIGH (ref 70–99)

## 2020-08-19 LAB — RAPID URINE DRUG SCREEN, HOSP PERFORMED
Amphetamines: NOT DETECTED
Barbiturates: NOT DETECTED
Benzodiazepines: NOT DETECTED
Cocaine: NOT DETECTED
Opiates: NOT DETECTED
Tetrahydrocannabinol: NOT DETECTED

## 2020-08-19 LAB — MRSA PCR SCREENING: MRSA by PCR: POSITIVE — AB

## 2020-08-19 LAB — TSH: TSH: 9.376 u[IU]/mL — ABNORMAL HIGH (ref 0.350–4.500)

## 2020-08-19 MED ORDER — SODIUM CHLORIDE 0.9% FLUSH
3.0000 mL | Freq: Two times a day (BID) | INTRAVENOUS | Status: DC
  Administered 2020-08-19 (×2): 3 mL via INTRAVENOUS

## 2020-08-19 MED ORDER — INSULIN ASPART 100 UNIT/ML ~~LOC~~ SOLN
0.0000 [IU] | SUBCUTANEOUS | Status: DC
  Administered 2020-08-19: 2 [IU] via SUBCUTANEOUS
  Administered 2020-08-19: 3 [IU] via SUBCUTANEOUS
  Administered 2020-08-20: 2 [IU] via SUBCUTANEOUS

## 2020-08-19 MED ORDER — ATORVASTATIN CALCIUM 40 MG PO TABS
40.0000 mg | ORAL_TABLET | Freq: Every day | ORAL | Status: DC
  Administered 2020-08-19 – 2020-08-20 (×2): 40 mg via ORAL
  Filled 2020-08-19 (×2): qty 1

## 2020-08-19 MED ORDER — LEVOTHYROXINE SODIUM 25 MCG PO TABS
25.0000 ug | ORAL_TABLET | Freq: Every day | ORAL | Status: DC
  Administered 2020-08-19 – 2020-08-20 (×2): 25 ug via ORAL
  Filled 2020-08-19 (×2): qty 1

## 2020-08-19 MED ORDER — ONDANSETRON HCL 4 MG/2ML IJ SOLN: 4.0000 mg | Freq: Four times a day (QID) | INTRAMUSCULAR | Status: DC | PRN

## 2020-08-19 MED ORDER — ACETAMINOPHEN 650 MG RE SUPP: 650.0000 mg | Freq: Four times a day (QID) | RECTAL | Status: DC | PRN

## 2020-08-19 MED ORDER — DEXTROSE 50 % IV SOLN
INTRAVENOUS | Status: AC
  Administered 2020-08-19: 25 mL
  Filled 2020-08-19: qty 50

## 2020-08-19 MED ORDER — SODIUM CHLORIDE 0.9 % IV SOLN: 250.0000 mL | INTRAVENOUS | Status: DC | PRN

## 2020-08-19 MED ORDER — ALBUTEROL SULFATE (2.5 MG/3ML) 0.083% IN NEBU
2.5000 mg | INHALATION_SOLUTION | RESPIRATORY_TRACT | Status: DC | PRN
  Administered 2020-08-19: 2.5 mg via RESPIRATORY_TRACT
  Filled 2020-08-19 (×2): qty 3

## 2020-08-19 MED ORDER — CHLORHEXIDINE GLUCONATE CLOTH 2 % EX PADS
6.0000 | MEDICATED_PAD | Freq: Every day | CUTANEOUS | Status: DC
  Administered 2020-08-19 – 2020-08-20 (×2): 6 via TOPICAL

## 2020-08-19 MED ORDER — ONDANSETRON HCL 4 MG PO TABS: 4.0000 mg | ORAL_TABLET | Freq: Four times a day (QID) | ORAL | Status: DC | PRN

## 2020-08-19 MED ORDER — SODIUM CHLORIDE 0.9% FLUSH: 3.0000 mL | INTRAVENOUS | Status: DC | PRN

## 2020-08-19 MED ORDER — SODIUM BICARBONATE 650 MG PO TABS
650.0000 mg | ORAL_TABLET | Freq: Two times a day (BID) | ORAL | Status: DC
  Administered 2020-08-19 – 2020-08-20 (×4): 650 mg via ORAL
  Filled 2020-08-19 (×5): qty 1

## 2020-08-19 MED ORDER — SODIUM CHLORIDE 0.9 % IV SOLN
3.0000 g | Freq: Two times a day (BID) | INTRAVENOUS | Status: DC
  Administered 2020-08-19 (×3): 3 g via INTRAVENOUS
  Filled 2020-08-19 (×5): qty 8

## 2020-08-19 MED ORDER — SODIUM CHLORIDE 0.9% FLUSH
3.0000 mL | Freq: Two times a day (BID) | INTRAVENOUS | Status: DC
  Administered 2020-08-19: 3 mL via INTRAVENOUS

## 2020-08-19 MED ORDER — ACETAMINOPHEN 325 MG PO TABS: 650.0000 mg | ORAL_TABLET | Freq: Four times a day (QID) | ORAL | Status: DC | PRN

## 2020-08-19 MED ORDER — HEPARIN SODIUM (PORCINE) 5000 UNIT/ML IJ SOLN
5000.0000 [IU] | Freq: Three times a day (TID) | INTRAMUSCULAR | Status: DC
  Administered 2020-08-19 – 2020-08-20 (×3): 5000 [IU] via SUBCUTANEOUS
  Filled 2020-08-19 (×4): qty 1

## 2020-08-19 MED ORDER — MUPIROCIN 2 % EX OINT
1.0000 "application " | TOPICAL_OINTMENT | Freq: Two times a day (BID) | CUTANEOUS | Status: DC
  Administered 2020-08-19 – 2020-08-20 (×3): 1 via NASAL
  Filled 2020-08-19 (×2): qty 22

## 2020-08-19 MED ORDER — FUROSEMIDE 40 MG PO TABS
40.0000 mg | ORAL_TABLET | Freq: Two times a day (BID) | ORAL | Status: DC
  Administered 2020-08-19 – 2020-08-20 (×3): 40 mg via ORAL
  Filled 2020-08-19 (×3): qty 1

## 2020-08-19 MED ORDER — DEXTROSE 50 % IV SOLN
INTRAVENOUS | Status: AC
  Administered 2020-08-19: 50 mL
  Filled 2020-08-19: qty 50

## 2020-08-19 MED ORDER — INSULIN GLARGINE 100 UNIT/ML ~~LOC~~ SOLN
4.0000 [IU] | Freq: Every day | SUBCUTANEOUS | Status: DC
  Administered 2020-08-19: 4 [IU] via SUBCUTANEOUS
  Filled 2020-08-19 (×3): qty 0.04

## 2020-08-19 NOTE — Progress Notes (Signed)
New Admission Note:  Arrival Method: By stretcher from ED around 0500 Mental Orientation: Alert and oriented Telemetry: Box 1, CCMD notified Assessment: Completed Skin: Completed, refer to flowsheets IV: L forearm S.L. Pain: Denies Tubes: None Safety Measures: Safety Fall Prevention Plan was given, discussed. Admission: Completed [facility] Orientation: Patient has been orientated to the room, unit and the staff. Family: None  Orders have been reviewed and implemented. Will continue to monitor the patient. Call light has been placed within reach and bed alarm has been activated.   Perry Mount, RN  Phone Number: [phone]

## 2020-08-19 NOTE — Progress Notes (Signed)
Hypoglycemic Event  CBG: 60  Treatment: D50 25 mL (12.5 gm)  Symptoms: Hungry  Follow-up CBG: Time:1205  CBG Result: 96  Possible Reasons for Event: Other: Pt NPO pending swallow eval  Comments/MD notified:No    Vira Agar

## 2020-08-19 NOTE — ED Notes (Signed)
Report given to floor Alyse Low, RN.

## 2020-08-19 NOTE — ED Notes (Signed)
Pt given a cup of apple juice, crackers, and cheese.

## 2020-08-19 NOTE — ED Notes (Signed)
Blood sugar is 76.

## 2020-08-19 NOTE — Evaluation (Signed)
Clinical/Bedside Swallow Evaluation Patient Details  Name: [name] [name] MRN: [id number] Date of Birth: [birth year]  Today's Date: 08/19/2020 Time: SLP Start Time (ACUTE ONLY): 32 SLP Stop Time (ACUTE ONLY): 1155 SLP Time Calculation (min) (ACUTE ONLY): 25 min  Past Medical History:  Past Medical History:  Diagnosis Date  . 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 Closed comminuted intertrochanteric fracture of left femur ([place])   . Diabetes mellitus without complication ([place])   . Diabetic hyperosmolar non-ketotic state ([place]) 05/15/2016  . DKA (diabetic ketoacidoses) 05/09/2016  . Hypertension   . Hypoglycemia 11/29/2019  . Postoperative anemia due to acute blood loss 06/18/2016  . Retroperitoneal hematoma 06/18/2016  . Stroke ([place])   . Vitamin B 12 deficiency 06/18/2016   Past Surgical History:  Past Surgical History:  Procedure Laterality Date  . HIP ARTHROPLASTY Right 02/05/2013   Procedure: ARTHROPLASTY BIPOLAR HIP;  Surgeon: [name], MD;  Location: [facility];  Service: Orthopedics;  Laterality: Right;  . INTRAMEDULLARY (IM) NAIL INTERTROCHANTERIC Left 06/16/2016   Procedure: INTRAMEDULLARY (IM) NAIL INTERTROCHANTRIC;  Surgeon: Rod Can, MD;  Location: [facility];  Service: Orthopedics;  Laterality: Left;   HPI:  [name] [name] is an 74 y.o. male with medical history significant for chronic kidney disease stage V, insulin dependent diabetes mellitus, history of CVA, chronic anemia, dementia, and hypertension, now presenting to the emergency department with decreased level of consciousness.  Patient is unable to contribute much of the history which is obtained from ED personnel and the patient's sister with whom he lives.  Patient had just been admitted to the hospital with altered mental status, acute on chronic renal failure, and acute on chronic anemia, was discharged home just a few hours ago, and was found to have decreased level of  consciousness.  SNF was recommended by PT during this recent admission with the patient refused this. Reports noted that patient had been choking on some food just prior to decreased LOC. CXR with new opacity at the right base. Per family, patine thas been coughing/choking when eating for some time. Patient was seen by SLP services in 2018 with recommendations for dysphagia 1 with pudding thick liquids, suspected chronic dysphagia exacerbated by intubation at that time.    Assessment / Plan / Recommendation Clinical Impression  Bedside swallow evaluation complete. Patient with expiratory wheeze at baseline which increased minimally during activity/po intake but otherwise, no overt indication of aspiration or signs of dysphagia. When questioned re: h/o swallowing difficulty noted in 2018 and family reports of coughing/choking with pos at home, patient denying any history or current difficulty, becoming verbally agressive/frustrated. Patient stating "I dont want to argue, I just want to eat" and denying need or desire for any f/u swallowing testing. Given dementia diagnosis, discussed with sisters x2 via phone who reported only occassional coughing with pos, mostly when blood sugars low. They also stated that patient can make his own decisions and that recommendations should be make according to this. Given this information and patient desires, will place patient on regular diet with thin liquids. Patient's frustration today interfering with ability for SLP to provide adequate education. Will f/u x 1 for education as needed.  SLP Visit Diagnosis: Dysphagia, unspecified (R13.10)    Aspiration Risk       Diet Recommendation Regular;Thin liquid   Liquid Administration via: Cup;Straw Medication Administration: Whole meds with liquid Supervision: Patient able to self feed;Intermittent supervision to cue for compensatory strategies Compensations: Slow rate;Small  sips/bites Postural Changes: Seated upright at  90 degrees    Other  Recommendations Oral Care Recommendations: Oral care BID   Follow up Recommendations None      Frequency and Duration min 1 x/week  1 week           Swallow Study   General HPI: [name] [name] is an 74 y.o. male with medical history significant for chronic kidney disease stage V, insulin dependent diabetes mellitus, history of CVA, chronic anemia, dementia, and hypertension, now presenting to the emergency department with decreased level of consciousness.  Patient is unable to contribute much of the history which is obtained from ED personnel and the patient's sister with whom he lives.  Patient had just been admitted to the hospital with altered mental status, acute on chronic renal failure, and acute on chronic anemia, was discharged home just a few hours ago, and was found to have decreased level of consciousness.  SNF was recommended by PT during this recent admission with the patient refused this. Reports noted that patient had been choking on some food just prior to decreased LOC. CXR with new opacity at the right base. Per family, patine thas been coughing/choking when eating for some time. Patient was seen by SLP services in 2018 with recommendations for dysphagia 1 with pudding thick liquids, suspected chronic dysphagia exacerbated by intubation at that time.  Type of Study: Bedside Swallow Evaluation Previous Swallow Assessment: see HPI Diet Prior to this Study: NPO Temperature Spikes Noted: No Respiratory Status: Room air History of Recent Intubation: No Behavior/Cognition: Alert;Cooperative;Pleasant mood Oral Cavity Assessment: Within Functional Limits Oral Care Completed by SLP: No Oral Cavity - Dentition: Edentulous Vision: Functional for self-feeding Self-Feeding Abilities: Able to feed self Patient Positioning: Upright in bed Baseline Vocal Quality: Normal Volitional Cough: Strong Volitional Swallow: Able to elicit    Oral/Motor/Sensory Function  Overall Oral Motor/Sensory Function: Within functional limits   Ice Chips Ice chips: Within functional limits Presentation: Spoon   Thin Liquid Thin Liquid: Within functional limits Presentation: Cup;Self Fed;Straw    Nectar Thick Nectar Thick Liquid: Not tested   Honey Thick Honey Thick Liquid: Not tested   Puree Puree: Within functional limits Presentation: Self Fed;Spoon   Solid     Solid: Within functional limits Presentation: [place] MA, CCC-SLP   Aloura Matsuoka Meryl 08/19/2020,12:19 PM

## 2020-08-19 NOTE — ED Notes (Addendum)
Notified Shalhoud, MD of pt wheezing, edema to upper and lower extremities. New orders were received.

## 2020-08-19 NOTE — Progress Notes (Signed)
Progress Note    [name] [name]  DGL:[id number] DOB: 27-Sep-1946  DOA: 08/18/2020 PCP: Libby Maw, MD    Brief Narrative:     Medical records reviewed and are as summarized below:  [name] [name] is an 74 y.o. male with medical history significant for chronic kidney disease stage V, insulin dependent diabetes mellitus, history of CVA, chronic anemia, dementia, and hypertension, now presenting to the emergency department with decreased level of consciousness.  Patient is unable to contribute much of the history which is obtained from ED personnel and the patient's sister with whom he lives.  Patient had just been admitted to the hospital with altered mental status, acute on chronic renal failure, and acute on chronic anemia, was discharged home just a few hours ago, and was found to have decreased level of consciousness.  SNF was recommended by PT during this recent admission with the patient refused this.   Assessment/Plan:   Principal Problem:   Acute encephalopathy Active Problems:   Anemia of chronic disease   CKD (chronic kidney disease), stage V ([name])   Brittle diabetes mellitus ([name])   History of cerebrovascular accident (CVA) in adulthood   Dementia ([place])   Aspiration pneumonia of right lower lobe ([name])    Acute encephalopathy  - Presents with decreased LOC shortly after hospital discharge, had been choking on some food just prior to this and appears to have been aspirating  - resolved -seems at baseline   Aspiration   - Presents with decreased LOC, had been choking and coughing while eating a hamburger just prior to this and is found to have new opacity at right base on CXR and pO2 of 46 on ABG  - He has been coughing and choking when eating for a long time per family  - Keep NPO pending SLP evaluation, start Unasyn, check sputum culture, trend procalcitonin    CKD V   - SCr is 6.15 on admission, had been closer to 5 a couple months ago   - Patient's family  notes that Mr. [facility] outpatient nephrologist did not feel that he is a dialysis candidate and they are accepting of this  - Renally-dose medications, continue bicarbonate and Lasix    Dementia  - He is calm and cooperative in ED  - Continue supportive care, delirium precautions    Anemia  - Hgb is 8.1 on admission, down from 8.5 at time of recent discharge  - He was transfused RBCs a few days ago, had negative FOBT and anemia suspected secondary to advanced CKD  - Monitor and transfuse as needed    History of CVA - No acute findings on CT head  - Continue statin    Insulin-dependent DM  - A1c was 10.0% last month  - Continue CBG checks and insulin   Overall poor prognosis and high risk of continued hospitalizations.  Palliative care consult for GOC (has seen palliative as an outpatient)   Family Communication/Anticipated D/C date and plan/Code Status   DVT prophylaxis: heparin Code Status: dnr  Disposition Plan: Status is: Inpatient  Remains inpatient appropriate because:Inpatient level of care appropriate due to severity of illness   Dispo: The patient is from: Home              Anticipated d/c is to: tbd              Anticipated d/c date is: 2 days              Patient  currently is not medically stable to d/c.         Medical Consultants:    Palliative care     Subjective:   Says he doesn't remember leaving the hospital  Objective:    Vitals:   08/19/20 0300 08/19/20 0445 08/19/20 0516 08/19/20 0944  BP: (!) 163/84 (!) 167/96 (!) 163/79 (!) 147/94  Pulse: 74 85 79 83  Resp: 11 20 18 18   Temp:  97.9 F (36.6 C) 97.6 F (36.4 C) 97.9 F (36.6 C)  TempSrc:  Oral Oral Oral  SpO2: 97% 96% 99% 99%  Weight:   83.7 kg     Intake/Output Summary (Last 24 hours) at 08/19/2020 1058 Last data filed at 08/19/2020 0600 Gross per 24 hour  Intake 97.11 ml  Output 225 ml  Net -127.89 ml   Filed Weights   08/19/20 0516  Weight: 83.7 kg     Exam:  General: Appearance:     Overweight male in no acute distress     Lungs:     No wheezing but rhonchi at bases, respirations unlabored  Heart:    Normal heart rate. Normal rhythm. No murmurs, rubs, or gallops.   MS:   All extremities are intact.   Neurologic:   Awake, alert, pleasant and cooperative    Data Reviewed:   I have personally reviewed following labs and imaging studies:  Labs: Labs show the following:   Basic Metabolic Panel: Recent Labs  Lab 08/14/20 1712 08/14/20 2201 08/15/20 4008 08/15/20 0642 08/16/20 0158 08/16/20 0158 08/17/20 0126 08/17/20 0126 08/18/20 1739 08/18/20 1739 08/18/20 2305 08/18/20 2330  NA   < >  --  141   < > 141  --  138  --  139  --  139 138  K   < >  --  3.2*   < > 3.7   < > 4.3   < > 4.2   < > 4.4 4.5  CL   < >  --  104  --  108  --  107  --  109  --   --  107  CO2   < >  --  23  --  23  --  20*  --  20*  --   --  22  GLUCOSE   < >  --  128*  --  98  --  134*  --  92  --   --  95  BUN   < >  --  69*  --  68*  --  68*  --  73*  --   --  76*  CREATININE   < >  --  7.08*  --  6.82*  --  6.31*  --  6.01*  --   --  6.15*  CALCIUM   < >  --  7.6*  --  7.7*  --  7.6*  --  7.7*  --   --  7.6*  MG  --  2.2  --   --   --   --   --   --   --   --   --   --    < > = values in this interval not displayed.   GFR Estimated Creatinine Clearance: 10.4 mL/min (A) (by C-G formula based on SCr of 6.15 mg/dL (H)). Liver Function Tests: Recent Labs  Lab 08/14/20 1712 08/18/20 1739 08/18/20 2330  AST 24 24 24   ALT 14 12 12  ALKPHOS 53 45 47  BILITOT 1.0 <0.1* 0.4  PROT 5.5* 5.6* 5.5*  ALBUMIN 2.3* 2.2* 2.1*   No results for input(s): LIPASE, AMYLASE in the last 168 hours. Recent Labs  Lab 08/14/20 1712 08/18/20 2331  AMMONIA 26 22   Coagulation profile No results for input(s): INR, PROTIME in the last 168 hours.  CBC: Recent Labs  Lab 08/14/20 1712 08/14/20 1853 08/16/20 0158 08/17/20 0126 08/18/20 1739  08/18/20 2305 08/18/20 2330  WBC 6.7  --  6.3 6.4 7.1  --  8.7  NEUTROABS 5.2  --  3.2 3.6  --   --  5.9  HGB 6.8*   < > 8.5* 8.8* 8.5* 8.8* 8.1*  HCT 22.5*   < > 26.4* 27.5* 26.5* 26.0* 26.3*  MCV 98.7  --  94.3 93.9 94.3  --  99.2  PLT 260  --  283 279 282  --  261   < > = values in this interval not displayed.   Cardiac Enzymes: No results for input(s): CKTOTAL, CKMB, CKMBINDEX, TROPONINI in the last 168 hours. BNP (last 3 results) No results for input(s): PROBNP in the last 8760 hours. CBG: Recent Labs  Lab 08/18/20 2115 08/19/20 0105 08/19/20 0310 08/19/20 0401 08/19/20 0732  GLUCAP 127* 76 57* 135* 101*   D-Dimer: No results for input(s): DDIMER in the last 72 hours. Hgb A1c: No results for input(s): HGBA1C in the last 72 hours. Lipid Profile: No results for input(s): CHOL, HDL, LDLCALC, TRIG, CHOLHDL, LDLDIRECT in the last 72 hours. Thyroid function studies: Recent Labs    08/18/20 2332  TSH 9.376*   Anemia work up: No results for input(s): VITAMINB12, FOLATE, FERRITIN, TIBC, IRON, RETICCTPCT in the last 72 hours. Sepsis Labs: Recent Labs  Lab 08/14/20 1712 08/14/20 2204 08/16/20 0158 08/17/20 0126 08/18/20 1739 08/18/20 2330  PROCALCITON  --   --   --   --   --  0.76  WBC   < >  --  6.3 6.4 7.1 8.7  LATICACIDVEN  --  1.5  --   --   --   --    < > = values in this interval not displayed.    Microbiology Recent Results (from the past 240 hour(s))  Respiratory Panel by RT PCR (Flu A&B, Covid) - Nasopharyngeal Swab     Status: None   Collection Time: 08/14/20  8:18 PM   Specimen: Nasopharyngeal Swab  Result Value Ref Range Status   SARS Coronavirus 2 by RT PCR NEGATIVE NEGATIVE Final    Comment: (NOTE) SARS-CoV-2 target nucleic acids are NOT DETECTED.  The SARS-CoV-2 RNA is generally detectable in upper respiratoy specimens during the acute phase of infection. The lowest concentration of SARS-CoV-2 viral copies this assay can detect is 131  copies/mL. A negative result does not preclude SARS-Cov-2 infection and should not be used as the sole basis for treatment or other patient management decisions. A negative result may occur with  improper specimen collection/handling, submission of specimen other than nasopharyngeal swab, presence of viral mutation(s) within the areas targeted by this assay, and inadequate number of viral copies (<131 copies/mL). A negative result must be combined with clinical observations, patient history, and epidemiological information. The expected result is Negative.  Fact Sheet for Patients:  PinkCheek.be  Fact Sheet for Healthcare Providers:  GravelBags.it  This test is no t yet approved or cleared by the [place] FDA and  has been authorized for detection and/or diagnosis of SARS-CoV-2 by FDA  under an Emergency Use Authorization (EUA). This EUA will remain  in effect (meaning this test can be used) for the duration of the COVID-19 declaration under Section 564(b)(1) of the Act, 21 U.S.C. section 360bbb-3(b)(1), unless the authorization is terminated or revoked sooner.     Influenza A by PCR NEGATIVE NEGATIVE Final   Influenza B by PCR NEGATIVE NEGATIVE Final    Comment: (NOTE) The Xpert Xpress SARS-CoV-2/FLU/RSV assay is intended as an aid in  the diagnosis of influenza from Nasopharyngeal swab specimens and  should not be used as a sole basis for treatment. Nasal washings and  aspirates are unacceptable for Xpert Xpress SARS-CoV-2/FLU/RSV  testing.  Fact Sheet for Patients: PinkCheek.be  Fact Sheet for Healthcare Providers: GravelBags.it  This test is not yet approved or cleared by the [place] FDA and  has been authorized for detection and/or diagnosis of SARS-CoV-2 by  FDA under an Emergency Use Authorization (EUA). This EUA will remain  in effect (meaning  this test can be used) for the duration of the  Covid-19 declaration under Section 564(b)(1) of the Act, 21  U.S.C. section 360bbb-3(b)(1), unless the authorization is  terminated or revoked. Performed at [facility], [place] [address], [place], [place] [address]   Respiratory Panel by RT PCR (Flu A&B, Covid) - Nasopharyngeal Swab     Status: None   Collection Time: 08/19/20  1:50 AM   Specimen: Nasopharyngeal Swab  Result Value Ref Range Status   SARS Coronavirus 2 by RT PCR NEGATIVE NEGATIVE Final    Comment: (NOTE) SARS-CoV-2 target nucleic acids are NOT DETECTED.  The SARS-CoV-2 RNA is generally detectable in upper respiratoy specimens during the acute phase of infection. The lowest concentration of SARS-CoV-2 viral copies this assay can detect is 131 copies/mL. A negative result does not preclude SARS-Cov-2 infection and should not be used as the sole basis for treatment or other patient management decisions. A negative result may occur with  improper specimen collection/handling, submission of specimen other than nasopharyngeal swab, presence of viral mutation(s) within the areas targeted by this assay, and inadequate number of viral copies (<131 copies/mL). A negative result must be combined with clinical observations, patient history, and epidemiological information. The expected result is Negative.  Fact Sheet for Patients:  PinkCheek.be  Fact Sheet for Healthcare Providers:  GravelBags.it  This test is no t yet approved or cleared by the [place] FDA and  has been authorized for detection and/or diagnosis of SARS-CoV-2 by FDA under an Emergency Use Authorization (EUA). This EUA will remain  in effect (meaning this test can be used) for the duration of the COVID-19 declaration under Section 564(b)(1) of the Act, 21 U.S.C. section 360bbb-3(b)(1), unless the authorization is terminated or revoked  sooner.     Influenza A by PCR NEGATIVE NEGATIVE Final   Influenza B by PCR NEGATIVE NEGATIVE Final    Comment: (NOTE) The Xpert Xpress SARS-CoV-2/FLU/RSV assay is intended as an aid in  the diagnosis of influenza from Nasopharyngeal swab specimens and  should not be used as a sole basis for treatment. Nasal washings and  aspirates are unacceptable for Xpert Xpress SARS-CoV-2/FLU/RSV  testing.  Fact Sheet for Patients: PinkCheek.be  Fact Sheet for Healthcare Providers: GravelBags.it  This test is not yet approved or cleared by the [place] FDA and  has been authorized for detection and/or diagnosis of SARS-CoV-2 by  FDA under an Emergency Use Authorization (EUA). This EUA will remain  in effect (meaning this  test can be used) for the duration of the  Covid-19 declaration under Section 564(b)(1) of the Act, 21  U.S.C. section 360bbb-3(b)(1), unless the authorization is  terminated or revoked. Performed at [facility], [place] [address], [place], [place] [address]   MRSA PCR Screening     Status: Abnormal   Collection Time: 08/19/20  1:50 AM   Specimen: Nasal Mucosa; Nasopharyngeal  Result Value Ref Range Status   MRSA by PCR POSITIVE (A) NEGATIVE Final    Comment: CRITICAL RESULT CALLED TO, READ BACK BY AND VERIFIED WITH: RN, K. GENGLER [id number] @0530  THANEY Performed at [place] [address], [place], [place] [address]     Procedures and diagnostic studies:  CT Head Wo Contrast  Result Date: 08/18/2020 CLINICAL DATA:  Altered mental status, found on floor EXAM: CT HEAD WITHOUT CONTRAST TECHNIQUE: Contiguous axial images were obtained from the base of the skull through the vertex without intravenous contrast. COMPARISON:  CT 08/14/2020 FINDINGS: Brain: Few stable hypoattenuating foci in the subcortical white matter of the bilateral frontal lobes, likely to reflect areas of prior lacunar type  infarcts. No evidence of acute infarction, hemorrhage, hydrocephalus, extra-axial collection, visible mass lesion or mass effect. Prominence of the ventricles, cisterns and sulci compatible with moderate frontal predominant parenchymal volume loss. Patchy areas of white matter hypoattenuation are most compatible with chronic microvascular angiopathy. Vascular: Atherosclerotic calcification of the carotid siphons and intradural vertebral arteries. No hyperdense vessel. Skull: No calvarial fracture or suspicious osseous lesion. No scalp swelling or hematoma. Sinuses/Orbits: Mild mural thickening in the left maxillary and ethmoid sinuses. Subtotal opacification left sphenoid sinus with small bubbly lucency/air-fluid level. Included orbital structures are unremarkable. Other: None. IMPRESSION: 1. No acute intracranial abnormality. 2. Stable moderate frontal predominant parenchymal volume loss and chronic microvascular angiopathy. 3. Stable remote lacunar type infarcts in the subcortical white matter of the bilateral frontal lobes. 4. Layering air-fluid level in the left sphenoid sinus with more mild mural disease in maxillary and ethmoid sinuses, could reflect an acute on chronic sinusitis. Correlate with clinical symptoms. Electronically Signed   By: Lovena Le M.D.   On: 08/18/2020 23:19   DG Chest Port 1 View  Result Date: 08/19/2020 CLINICAL DATA:  Respiratory failure. EXAM: PORTABLE CHEST 1 VIEW COMPARISON:  August 18, 2020 FINDINGS: Again noted are ill-defined streaky opacities at the lung bases bilaterally, slightly worsened from prior study. The heart size is stable. There is no pneumothorax. There may be trace bilateral pleural effusions. Aortic calcifications are again noted. There is no acute osseous abnormality. IMPRESSION: 1. Worsening streaky bibasilar airspace opacities which may represent atelectasis or developing infiltrates. 2. Possible trace bilateral pleural effusions. 3. Stable cardiac  silhouette. Electronically Signed   By: Constance Holster M.D.   On: 08/19/2020 02:06   DG Chest Port 1 View  Result Date: 08/18/2020 CLINICAL DATA:  Wheezing and altered mental status. EXAM: PORTABLE CHEST 1 VIEW COMPARISON:  08/14/2020 FINDINGS: Lower lung volumes from recent exam. Ill-defined opacity at the right lung base, new. Stable heart size and mediastinal contours. No pneumothorax. No large subpulmonic effusion. Trace perifissural scarring in the right lung, chronic. No pulmonary edema. No acute osseous abnormalities are seen. IMPRESSION: Ill-defined opacity at the right lung base, new from recent exam, may be atelectasis or pneumonia. Electronically Signed   By: Keith Rake M.D.   On: 08/18/2020 22:09    Medications:   . atorvastatin  40 mg Oral Daily  . Chlorhexidine Gluconate  Cloth  6 each Topical Q0600  . furosemide  40 mg Oral BID  . heparin  5,000 Units Subcutaneous Q8H  . insulin aspart  0-6 Units Subcutaneous Q4H  . insulin glargine  4 Units Subcutaneous QHS  . levothyroxine  25 mcg Oral QAC breakfast  . mupirocin ointment  1 application Nasal BID  . sodium bicarbonate  650 mg Oral BID  . sodium chloride flush  3 mL Intravenous Q12H  . sodium chloride flush  3 mL Intravenous Q12H   Continuous Infusions: . sodium chloride    . ampicillin-sulbactam (UNASYN) IV Stopped (08/19/20 0128)     LOS: 0 days   [name]  [facility]   How to contact the [facility] Attending or Consulting provider [place] or covering provider during after hours [place], for this patient?  1. Check the care team in [facility] and look for a) attending/consulting [facility] provider listed and b) the [facility] team listed 2. Log into [email or web address] and use [place]'s universal password to access. If you do not have the password, please contact the hospital operator. 3. Locate the [facility] provider you are looking for under [facility] and page to a number that you can be directly reached. 4. If you  still have difficulty reaching the provider, please page the [facility] (Director on Call) for the Hospitalists listed on amion for assistance.  08/19/2020, 10:58 AM

## 2020-08-19 NOTE — Progress Notes (Signed)
Pharmacy Antibiotic Note  [name] [name] is a 74 y.o. male admitted on 08/18/2020 with aspiration pneumonia.  Pharmacy has been consulted for Unasyn dosing.  Plan: Unasyn 3 g IV q12h F/U renal function     Temp (24hrs), Avg:98.3 F (36.8 C), Min:97.8 F (36.6 C), Max:98.8 F (37.1 C)  Recent Labs  Lab 08/14/20 1712 08/14/20 2204 08/15/20 0642 08/16/20 0158 08/17/20 0126 08/18/20 1739 08/18/20 2330  WBC 6.7  --   --  6.3 6.4 7.1 8.7  CREATININE 7.66*  --  7.08* 6.82* 6.31* 6.01*  --   LATICACIDVEN  --  1.5  --   --   --   --   --     Estimated Creatinine Clearance: 10.6 mL/min (A) (by C-G formula based on SCr of 6.01 mg/dL (H)).    No Known Allergies    Caryl Pina 08/19/2020 12:00 AM

## 2020-08-19 NOTE — ED Notes (Signed)
Notified provider of wheezing in lung fields and wheezing was not present on pt arrival.

## 2020-08-19 NOTE — ED Provider Notes (Addendum)
[facility] EMERGENCY DEPARTMENT Provider Note   CSN: [id number] Arrival date & time: 08/18/20  2054     History Chief Complaint  Patient presents with  . Altered Mental Status    [name] [name] is a 74 y.o. male past medical history of CVA, uncontrolled diabetes, CHF, CKD, alcohol abuse, dementia, presenting to the ED few hours after discharge from hospital admission for altered mental status.  History is provided by patient's sisters over the phone.  They report patient had just returned home they walked him to his bedroom and fed him a walker for dinner.  He completed the meal, however shortly after they found him sitting upright on the floor, on his knees and leaning against the bed with his eyes open though unresponsive, slow shallow breaths.  EMS was called who provided bag ventilation and patient began breathing spontaneously and is back to mental baseline on arrival to the ED.  He is alert, oriented to person and place at baseline.  He is unsure of the events that led to his return to the ED.   Per chart review, patient was admitted for encephalopathy with DKA, acute on chronic renal failure, and anemia requiring blood transfusion.  He was also diuresed in addition to given IV fluids for DKA.  He is reportedly not a dialysis candidate.  He had negative CT imaging of his head, echocardiogram, as well as additional work-up.  He he was discharged in stable condition.  The history is provided by medical records and a relative.       Past Medical History:  Diagnosis Date  . 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 Closed comminuted intertrochanteric fracture of left femur ([place])   . Diabetes mellitus without complication ([place])   . Diabetic hyperosmolar non-ketotic state ([place]) 05/15/2016  . DKA (diabetic ketoacidoses) 05/09/2016  . Hypertension   . Hypoglycemia 11/29/2019  . Postoperative anemia due to acute blood loss  06/18/2016  . Retroperitoneal hematoma 06/18/2016  . Stroke ([place])   . Vitamin B 12 deficiency 06/18/2016    Patient Active Problem List   Diagnosis Date Noted  . Acute encephalopathy 08/18/2020  . Aspiration pneumonitis ([place]) 08/18/2020  . AKI (acute kidney injury) ([place]) 08/15/2020  . Type 1 diabetes mellitus with hyperglycemia ([place]) 07/27/2020  . Type 1 diabetes mellitus with stage 4 chronic kidney disease ([place]) 07/27/2020  . Diabetes mellitus type I ([place]) 07/27/2020  . Thrombocytopenia ([place]) 04/23/2020  . Hypothyroidism 04/22/2020  . DKA (diabetic ketoacidoses) 04/14/2020  . PVD (peripheral vascular disease) ([place]) 02/02/2020  . Poor social situation 12/08/2019  . CKD (chronic kidney disease) stage 4, GFR 15-29 ml/min ([name]) 12/08/2019  . Cognitive decline 11/22/2019  . ETOH abuse 05/17/2019  . Chronic diastolic CHF (congestive heart failure) ([place]) 05/17/2019  . Uncontrolled type 2 diabetes mellitus with hyperglycemia, with long-term current use of insulin ([place]) 10/03/2018  . Underweight 11/18/2017  . Anemia 11/18/2017  . History of cerebrovascular accident (CVA) in adulthood 07/25/2017  . Depression with anxiety 07/25/2017  . Left ventricular diastolic dysfunction 30/04/6225  . Dementia ([place]) 07/25/2017  . Brittle diabetes mellitus ([place]) 06/28/2017  . Hyperlipidemia associated with type 2 diabetes mellitus ([place]) 06/27/2017  . Hypertension associated with diabetes ([place]) 06/27/2017  . Acute metabolic encephalopathy 33/35/4562  . Weakness 11/16/2016  . CKD (chronic kidney disease), stage V ([place])   . Noncompliance with medication regimen 08/17/2016  . History of DVT (deep vein thrombosis) 07/06/2016  .  Vitamin B 12 deficiency 06/18/2016  . Hyperglycemia 05/15/2016  . Hyponatremia 05/15/2016  . Anxiety 05/15/2016  . Narcotic dependency, continuous ([place]) 11/02/2015  . Uncontrolled type 2 diabetes mellitus with diabetic nephropathy, with long-term current use of insulin ([place])  08/12/2015  . Anemia of chronic disease 02/02/2013    Past Surgical History:  Procedure Laterality Date  . HIP ARTHROPLASTY Right 02/05/2013   Procedure: ARTHROPLASTY BIPOLAR HIP;  Surgeon: [name], MD;  Location: [facility];  Service: Orthopedics;  Laterality: Right;  . INTRAMEDULLARY (IM) NAIL INTERTROCHANTERIC Left 06/16/2016   Procedure: INTRAMEDULLARY (IM) NAIL INTERTROCHANTRIC;  Surgeon: Rod Can, MD;  Location: [place];  Service: Orthopedics;  Laterality: Left;       Family History  Problem Relation Age of Onset  . Alzheimer's disease Mother   . Hypertension Mother   . Hyperlipidemia Father   . Hypertension Father   . Healthy Maternal Grandmother   . Pneumonia Maternal Grandfather     Social History   Tobacco Use  . Smoking status: Former Smoker    Packs/day: 0.25    Years: 30.00    Pack years: 7.50    Types: Cigarettes    Quit date: 02/01/2016    Years since quitting: 4.5  . Smokeless tobacco: Never Used  Vaping Use  . Vaping Use: Never used  Substance Use Topics  . Alcohol use: No  . Drug use: No    Home Medications Prior to Admission medications   Medication Sig Start Date End Date Taking? Authorizing Provider  ACCU-CHEK GUIDE test strip USE AS DIRECTED 06/12/20   Azzie Glatter, [name]  acetaminophen (TYLENOL) 325 MG tablet Take 2 tablets (650 mg total) by mouth every 6 (six) hours as needed for up to 30 doses for mild pain or moderate pain. 07/06/20   Wyvonnia Dusky, MD  amLODipine (NORVASC) 10 MG tablet Take 1 tablet (10 mg total) by mouth daily. 06/26/20   Libby Maw, MD  aspirin EC 81 MG EC tablet Take 1 tablet (81 mg total) by mouth daily. Swallow whole. Patient not taking: Reported on 08/14/2020 04/28/20   Barb Merino, MD  atorvastatin (LIPITOR) 40 MG tablet Take 1 tablet (40 mg total) by mouth daily. 06/26/20   Libby Maw, MD  Blood Glucose Monitoring Suppl (ACCU-CHEK GUIDE) w/Device KIT 1 Device by Does not apply route  QID. 11/02/19   Azzie Glatter, [name]  furosemide (LASIX) 40 MG tablet Take 40 mg by mouth 2 (two) times daily. 07/30/20   [provider]  glucose blood (ACCU-CHEK GUIDE) test strip Use as instructed 11/03/18   Azzie Glatter, [name]  glucose blood (TRUE METRIX BLOOD GLUCOSE TEST) test strip CHECK BLOOD SUGAR UP TO 4 TIMES A DAY. Patient taking differently: 1 each by Other route See admin instructions. CHECK BLOOD SUGAR UP TO 2 TIMES A DAY. 05/25/18   Azzie Glatter, [name]  hydrALAZINE (APRESOLINE) 100 MG tablet TAKE 1 TABLET BY MOUTH 3 TIMES A DAY. 07/30/20   Libby Maw, MD  Insulin Glargine [facility]) 100 UNIT/ML Inject 6 Units into the skin daily. Patient taking differently: Inject 5 Units into the skin at bedtime.  07/26/20   [name], [name], MD  insulin lispro (HUMALOG KWIKPEN) 100 UNIT/ML KwikPen Max daily 30 units Patient taking differently: Inject 0-10 Units into the skin 3 (three) times daily as needed (blood sugar). Max daily 30 units 08/10/20   [name], [name], MD  Insulin Pen Needle 32G X  4 MM MISC 1 Device by Does not apply route in the morning, at noon, in the evening, and at bedtime. 01/03/20   [name], [name], MD  Insulin Syringe-Needle U-100 (BD INSULIN SYRINGE U/F) 31G X 5/16" 0.3 ML MISC Inject 1 each as directed 2 (two) times daily. as directed 11/02/19   Azzie Glatter, [name]  levothyroxine (SYNTHROID) 25 MCG tablet Take 1 tablet (25 mcg total) by mouth daily before breakfast. 06/26/20   Libby Maw, MD  sodium bicarbonate 650 MG tablet Take 1 tablet (650 mg total) by mouth 2 (two) times daily. 04/19/20   [name], MD  vitamin B-12 1000 MCG tablet Take 1 tablet (1,000 mcg total) by mouth daily. 12/05/19   [name], MD  Vitamin D, Ergocalciferol, (DRISDOL) 1.25 MG (50000 UNIT) CAPS capsule Take 50,000 Units by mouth every 7 (seven) days. Thursdays    [provider]    Allergies     Patient has no known allergies.  Review of Systems   Review of Systems  Unable to perform ROS: Mental status change    Physical Exam Updated Vital Signs BP (!) 146/74   Pulse 83   Temp 97.9 F (36.6 C) (Oral)   Resp 12   SpO2 97%   Physical Exam Vitals and nursing note reviewed.  Constitutional:      General: He is not in acute distress.    Appearance: He is well-developed.     Comments: Patient is alert, interactive, smiling.  He is oriented to person and place (at mental baseline)  HENT:     Head: Normocephalic and atraumatic.  Eyes:     Conjunctiva/sclera: Conjunctivae normal.  Cardiovascular:     Rate and Rhythm: Normal rate and regular rhythm.     Comments: All 4 extremities are edematous with pitting edema.   Pulmonary:     Effort: Pulmonary effort is normal.     Comments: Patient is on nonrebreather upon entering the room and satting 100% with normal respiratory effort.   wheezes on auscultation.  Nonrebreather removed, maintaining O2 sat 97 100% on room air with normal respiratory rate Abdominal:     Palpations: Abdomen is soft.     Tenderness: There is no abdominal tenderness.  Skin:    General: Skin is warm.     Comments: Large bruising to right forearm.  Chronic skin changes to bilateral lower extremities  Neurological:     Mental Status: He is alert.     Comments: Patient is following simple commands without difficulty.  Spontaneously moving all extremities with purposeful movements.  Normal tone.  Psychiatric:        Behavior: Behavior normal.     ED Results / Procedures / Treatments   Labs (all labs ordered are listed, but only abnormal results are displayed) Labs Reviewed  CBC WITH DIFFERENTIAL/PLATELET - Abnormal; Notable for the following components:      Result Value   RBC 2.65 (*)    Hemoglobin 8.1 (*)    HCT 26.3 (*)    Monocytes Absolute 1.1 (*)    All other components within normal limits  CBG MONITORING, ED - Abnormal; Notable for the  following components:   Glucose-Capillary 127 (*)    All other components within normal limits  I-STAT ARTERIAL BLOOD GAS, ED - Abnormal; Notable for the following components:   pH, Arterial 7.341 (*)    pO2, Arterial 46 (*)    Calcium, Ion 1.12 (*)    HCT 26.0 (*)  Hemoglobin 8.8 (*)    All other components within normal limits  RESPIRATORY PANEL BY RT PCR (FLU A&B, COVID)  EXPECTORATED SPUTUM ASSESSMENT W REFEX TO RESP CULTURE  MRSA PCR SCREENING  COMPREHENSIVE METABOLIC PANEL  RAPID URINE DRUG SCREEN, HOSP PERFORMED  TSH  AMMONIA  PROCALCITONIN  BASIC METABOLIC PANEL  CBC    EKG EKG Interpretation  Date/Time:  Saturday August 18 2020 20:58:14 EDT Ventricular Rate:  82 PR Interval:    QRS Duration: 118 QT Interval:  396 QTC Calculation: 463 R Axis:   3 Text Interpretation: Sinus rhythm Nonspecific intraventricular conduction delay Anterior infarct, old Confirmed by Davonna Belling [phone]) on 08/18/2020 10:05:08 PM   Radiology CT Head Wo Contrast  Result Date: 08/18/2020 CLINICAL DATA:  Altered mental status, found on floor EXAM: CT HEAD WITHOUT CONTRAST TECHNIQUE: Contiguous axial images were obtained from the base of the skull through the vertex without intravenous contrast. COMPARISON:  CT 08/14/2020 FINDINGS: Brain: Few stable hypoattenuating foci in the subcortical white matter of the bilateral frontal lobes, likely to reflect areas of prior lacunar type infarcts. No evidence of acute infarction, hemorrhage, hydrocephalus, extra-axial collection, visible mass lesion or mass effect. Prominence of the ventricles, cisterns and sulci compatible with moderate frontal predominant parenchymal volume loss. Patchy areas of white matter hypoattenuation are most compatible with chronic microvascular angiopathy. Vascular: Atherosclerotic calcification of the carotid siphons and intradural vertebral arteries. No hyperdense vessel. Skull: No calvarial fracture or suspicious osseous  lesion. No scalp swelling or hematoma. Sinuses/Orbits: Mild mural thickening in the left maxillary and ethmoid sinuses. Subtotal opacification left sphenoid sinus with small bubbly lucency/air-fluid level. Included orbital structures are unremarkable. Other: None. IMPRESSION: 1. No acute intracranial abnormality. 2. Stable moderate frontal predominant parenchymal volume loss and chronic microvascular angiopathy. 3. Stable remote lacunar type infarcts in the subcortical white matter of the bilateral frontal lobes. 4. Layering air-fluid level in the left sphenoid sinus with more mild mural disease in maxillary and ethmoid sinuses, could reflect an acute on chronic sinusitis. Correlate with clinical symptoms. Electronically Signed   By: Lovena Le M.D.   On: 08/18/2020 23:19   DG Chest Port 1 View  Result Date: 08/18/2020 CLINICAL DATA:  Wheezing and altered mental status. EXAM: PORTABLE CHEST 1 VIEW COMPARISON:  08/14/2020 FINDINGS: Lower lung volumes from recent exam. Ill-defined opacity at the right lung base, new. Stable heart size and mediastinal contours. No pneumothorax. No large subpulmonic effusion. Trace perifissural scarring in the right lung, chronic. No pulmonary edema. No acute osseous abnormalities are seen. IMPRESSION: Ill-defined opacity at the right lung base, new from recent exam, may be atelectasis or pneumonia. Electronically Signed   By: Keith Rake M.D.   On: 08/18/2020 22:09    Procedures Procedures (including critical care time)  Medications Ordered in ED Medications  Ampicillin-Sulbactam (UNASYN) 3 g in sodium chloride 0.9 % 100 mL IVPB (has no administration in time range)  levothyroxine (SYNTHROID) tablet 25 mcg (has no administration in time range)  sodium bicarbonate tablet 650 mg (has no administration in time range)  furosemide (LASIX) tablet 40 mg (has no administration in time range)  atorvastatin (LIPITOR) tablet 40 mg (has no administration in time range)   insulin aspart (novoLOG) injection 0-6 Units (has no administration in time range)  heparin injection 5,000 Units (has no administration in time range)  sodium chloride flush (NS) 0.9 % injection 3 mL (has no administration in time range)  sodium chloride flush (NS) 0.9 % injection 3 mL (  has no administration in time range)  sodium chloride flush (NS) 0.9 % injection 3 mL (has no administration in time range)  0.9 %  sodium chloride infusion (has no administration in time range)  acetaminophen (TYLENOL) tablet 650 mg (has no administration in time range)    Or  acetaminophen (TYLENOL) suppository 650 mg (has no administration in time range)  ondansetron (ZOFRAN) tablet 4 mg (has no administration in time range)    Or  ondansetron (ZOFRAN) injection 4 mg (has no administration in time range)  insulin glargine (LANTUS) injection 4 Units (has no administration in time range)    ED Course  I have reviewed the triage vital signs and the nursing notes.  Pertinent labs & imaging results that were available during my care of the patient were reviewed by me and considered in my medical decision making (see chart for details).  Clinical Course as of Aug 19 4  Sat Aug 18, 2020  2130 Patient reevaluated, remains alert with normal respiratory effort and rate, oxygenating at 98 to 100% on room air.   [JR]  2141 Discussed with patient's sister over the phone.  She states that had just gotten the patient home walked into his room.  They brought him a whopper for lunch which he finished.  Soon after they found him on his knees upright leaning against the bed with his eyes open, not responding, breathing was slow and shallow.  States he does make urine.    [JR]  2315 Patient reevaluated once more.  No changes in mental status or respiratory status.   [JR]    Clinical Course User Index [JR] Jerlene Rockers, [place] N, PA-C   MDM Rules/Calculators/A&P                          Patient presenting to the ED  via EMS from home soon after arriving there after being discharged from hospital admission (as detailed above in HPI).  On arrival, he has nonrebreather in place per EMS, however has normal respiratory effort and rate satting at 100%.  Nonrebreather was removed, maintaining O2 sat and respiratory rate.  He has wheezes bilaterally.  He is also quite edematous to his extremities though is alert and oriented to baseline.  No obvious head trauma.  Vital signs are stable.  Recent labs drawn in the 5:00 hour today were unremarkable.  Obtained chest x-ray here in the ED with possible new basilar opacity in the right versus atelectasis.  EKG without new arrhythmia.  CBG by EMS on arrival to the house was 160, as well as on arrival to the ED at 127. Considered opiate use, pt not prescribed narcotics. Discussed with attending Dr. Alvino Chapel.  Will consult hospitalist for further monitoring and workup for encephalopathy of unclear etiology at this time.  Repeat basic labs and CT imaging ordered.  Final Clinical Impression(s) / ED Diagnoses Final diagnoses:  Acute encephalopathy    Rx / DC Orders ED Discharge Orders    None       Lyndel Sarate, [place] N, PA-C 08/19/20 0017    Cleatus Gabriel, [place] N, PA-C 08/19/20 0019    Davonna Belling, MD 08/19/20 0022

## 2020-08-20 DIAGNOSIS — F0391 Unspecified dementia with behavioral disturbance: Secondary | ICD-10-CM

## 2020-08-20 DIAGNOSIS — Z7189 Other specified counseling: Secondary | ICD-10-CM

## 2020-08-20 DIAGNOSIS — Z515 Encounter for palliative care: Secondary | ICD-10-CM

## 2020-08-20 LAB — BASIC METABOLIC PANEL
Anion gap: 12 (ref 5–15)
BUN: 77 mg/dL — ABNORMAL HIGH (ref 8–23)
CO2: 20 mmol/L — ABNORMAL LOW (ref 22–32)
Calcium: 8 mg/dL — ABNORMAL LOW (ref 8.9–10.3)
Chloride: 109 mmol/L (ref 98–111)
Creatinine, Ser: 6.56 mg/dL — ABNORMAL HIGH (ref 0.61–1.24)
GFR, Estimated: 8 mL/min — ABNORMAL LOW (ref >60)
Glucose, Bld: 170 mg/dL — ABNORMAL HIGH (ref 70–99)
Potassium: 4.7 mmol/L (ref 3.5–5.1)
Sodium: 141 mmol/L (ref 135–145)

## 2020-08-20 LAB — GLUCOSE, CAPILLARY
Glucose-Capillary: 114 mg/dL — ABNORMAL HIGH (ref 70–99)
Glucose-Capillary: 137 mg/dL — ABNORMAL HIGH (ref 70–99)
Glucose-Capillary: 219 mg/dL — ABNORMAL HIGH (ref 70–99)
Glucose-Capillary: 234 mg/dL — ABNORMAL HIGH (ref 70–99)
Glucose-Capillary: 85 mg/dL (ref 70–99)

## 2020-08-20 LAB — CBC
HCT: 26.9 % — ABNORMAL LOW (ref 39.0–52.0)
Hemoglobin: 8.2 g/dL — ABNORMAL LOW (ref 13.0–17.0)
MCH: 29.6 pg (ref 26.0–34.0)
MCHC: 30.5 g/dL (ref 30.0–36.0)
MCV: 97.1 fL (ref 80.0–100.0)
Platelets: 251 K/uL (ref 150–400)
RBC: 2.77 MIL/uL — ABNORMAL LOW (ref 4.22–5.81)
RDW: 14 % (ref 11.5–15.5)
WBC: 6.2 K/uL (ref 4.0–10.5)
nRBC: 0 % (ref 0.0–0.2)

## 2020-08-20 LAB — PROCALCITONIN: Procalcitonin: 0.56 ng/mL

## 2020-08-20 MED ORDER — INSULIN ASPART 100 UNIT/ML ~~LOC~~ SOLN: 0.0000 [IU] | Freq: Every day | SUBCUTANEOUS | Status: DC

## 2020-08-20 MED ORDER — BASAGLAR KWIKPEN 100 UNIT/ML ~~LOC~~ SOPN: 5.0000 [IU] | PEN_INJECTOR | Freq: Every day | SUBCUTANEOUS | Status: DC

## 2020-08-20 MED ORDER — INSULIN ASPART 100 UNIT/ML ~~LOC~~ SOLN
0.0000 [IU] | Freq: Three times a day (TID) | SUBCUTANEOUS | Status: DC
  Administered 2020-08-20: 5 [IU] via SUBCUTANEOUS

## 2020-08-20 MED ORDER — AMOXICILLIN-POT CLAVULANATE 500-125 MG PO TABS
1.0000 | ORAL_TABLET | ORAL | Status: DC
  Administered 2020-08-20: 500 mg via ORAL
  Filled 2020-08-20: qty 1

## 2020-08-20 MED ORDER — INSULIN LISPRO (1 UNIT DIAL) 100 UNIT/ML (KWIKPEN)
0.0000 [IU] | PEN_INJECTOR | Freq: Three times a day (TID) | SUBCUTANEOUS | Status: DC | PRN
Start: 2020-08-20 — End: 2021-02-16

## 2020-08-20 MED ORDER — AMOXICILLIN-POT CLAVULANATE 500-125 MG PO TABS: 1.0000 | ORAL_TABLET | ORAL | 0 refills | Status: DC

## 2020-08-20 MED ORDER — AMOXICILLIN-POT CLAVULANATE 875-125 MG PO TABS: 1.0000 | ORAL_TABLET | Freq: Two times a day (BID) | ORAL | Status: DC

## 2020-08-20 NOTE — Progress Notes (Signed)
Inpatient Diabetes Program Recommendations  AACE/ADA: New Consensus Statement on Inpatient Glycemic Control (2015)  Target Ranges:  Prepandial:   less than 140 mg/dL      Peak postprandial:   less than 180 mg/dL (1-2 hours)      Critically ill patients:  140 - 180 mg/dL   Lab Results  Component Value Date   GLUCAP 114 (H) 08/20/2020   HGBA1C 10.7 (H) 08/15/2020    Review of Glycemic Control  Results for [name] [name], [name] [name] (MRN [id number]) as of 08/20/2020 09:12  Ref. Range 08/19/2020 21:00 08/20/2020 00:32 08/20/2020 05:01 08/20/2020 07:32  Glucose-Capillary Latest Ref Range: 70 - 99 mg/dL 225 (H) 219 (H) 85 114 (H)   Diabetes history:DM1 Outpatient Diabetes medications:Basaglar 5 units + Humalog 0-10 units tid meal coverage Current orders for Inpatient glycemic control:Lantus 15 units QD, Novolog 3 units TID, Novolog 0-15 units TID, Novolog 0-5 units QHS  Inpatient Diabetes Program Recommendations: Patient well known to inpatient diabetes team and is sensitive to insulin, esp with ESRD.   Noted diet order, may want to consider: -Changing correction to Novolog 0-6 units tid correction.  -Adding Novolog 3 units TID (assuming patient is consuming >50% of meals).    Thanks, Bronson Curb, MSN, RNC-OB Diabetes Coordinator [phone] (8a-5p)

## 2020-08-20 NOTE — Progress Notes (Signed)
PHARMACY NOTE:  ANTIMICROBIAL RENAL DOSAGE ADJUSTMENT  Current antimicrobial regimen includes a mismatch between antimicrobial dosage and estimated renal function.  As per policy approved by the Pharmacy & Therapeutics and Medical Executive Committees, the antimicrobial dosage will be adjusted accordingly.  Current antimicrobial dosage:  Augmentin 875mg  Q12h  Indication: aspiration PNA  Renal Function:  Estimated Creatinine Clearance: 9.7 mL/min (A) (by C-G formula based on SCr of 6.56 mg/dL (H)). []      On intermittent HD, scheduled: []      On CRRT    Antimicrobial dosage has been changed to:  Augmentin 500 mg Q24h  Additional comments:   Norman Bier A. Levada Dy, PharmD, BCPS, FNKF Clinical Pharmacist [place] Please utilize Amion for appropriate phone number to reach the unit pharmacist ([place])   08/20/2020 9:53 AM

## 2020-08-20 NOTE — Consult Note (Signed)
Consultation Note Date: 08/20/2020   Patient Name: [name] [name]  DOB: [birth year]  MRN: [id number]  Age / Sex: 74 y.o., male  PCP: [name] Maw, MD Referring Physician: [name], DO  Reason for Consultation: Establishing goals of care  HPI/Patient Profile: 74 y.o. 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 Concern for aspiration event. Per notes he has not been very receptive to recommendations or even evaluation at times with SLP/PT. He has had 5 admissions since June 2021. Followed by outpatient palliative services via [name] [name].   Clinical Assessment and Goals of Care: I met today at [name] [name]'s bedside. I sat down and attempted to speak with him but after asking him one questions he became very agitated and did not want to speak with me and asked me to leave. I left him in peace. Of note, when I first entered his room he was drinking an Ensure and had much coughing after he drank and when I asked him about his cough he tells me that this is because he drank while his head was down to low. I encouraged him to sit up more next time to allow his swallowing to be better and more comfortable for him but he did not respond back to me in any way.   I called and spoke with sister, [name] [name]. [name] [name] shares that she and her sister, [name] [name], help care for [name] along with their 20 yo mother and brother who has Downs syndrome. [name] [name] shares that [name] [name]'s daughter looked after him but she died ~2 years ago. [name] [name] believes this took a toll on Godfrey's health as well. She tells me that she has seen signs at home that make her think that he is giving up and just wants to be left alone. I  confirmed that this is my impression that he is not interested in further work up or testing and just wants to live out the rest of his life at home and in peace. We discussed the alternative of returning to the hospital could be to have assistance from hospice at home to give [name] [name] and her family support to care for him (I am unsure how cooperative he will be with care but family certainly needs help). We discussed the benefits of hospice services in the home. [name] [name] agrees that this would be helpful at this time and would like to have hospice follow at home. She is already connected with [name] [name] for outpatient palliative so she would like to have hospice services arranged with them as well. [name] [name] also asks about Lasix and fluid retention and we discussed the complexity of this for him as his renal function is critically low but this is something else that hospice can assist them with at home.  All questions/concerns addressed to the best of my ability.  Emotional support  provided.   Primary Decision Maker NEXT OF KIN siblings [name] [name] and [name] [name]    SUMMARY OF RECOMMENDATIONS   - Home with hospice support  Code Status/Advance Care Planning:  DNR   Symptom Management:   None currently. He wishes to continue regular diet. Hospice to manage any symptoms that arise.   Palliative Prophylaxis:   Aspiration and Delirium Protocol  Additional Recommendations (Limitations, Scope, Preferences):  Avoid Hospitalization  Psycho-social/Spiritual:   Desire for further Chaplaincy support:no  Additional Recommendations: Education on Hospice  Prognosis:   < 6 months  Discharge Planning: Home with Hospice      Primary Diagnoses: Present on Admission: . Acute encephalopathy . CKD (chronic kidney disease), stage V ([name] [name]) . Anemia of chronic disease . Brittle diabetes mellitus ([name] [name]) . Dementia ([name] [name])   I have reviewed the medical record, interviewed the patient and family, and examined the  patient. The following aspects are pertinent.  Past Medical History:  Diagnosis Date  . 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 Closed comminuted intertrochanteric fracture of left femur ([name] [name])   . Diabetes mellitus without complication ([name] [name])   . Diabetic hyperosmolar non-ketotic state ([name] [name]) 05/15/2016  . DKA (diabetic ketoacidoses) 05/09/2016  . Hypertension   . Hypoglycemia 11/29/2019  . Postoperative anemia due to acute blood loss 06/18/2016  . Retroperitoneal hematoma 06/18/2016  . Stroke ([name] [name])   . Vitamin B 12 deficiency 06/18/2016   Social History   Socioeconomic History  . Marital status: Single    Spouse name: Not on file  . Number of children: 2  . Years of education: 9  . Highest education level: Not on file  Occupational History  . Occupation: Retired  Tobacco Use  . Smoking status: Former Smoker    Packs/day: 0.25    Years: 30.00    Pack years: 7.50    Types: Cigarettes    Quit date: 02/01/2016    Years since quitting: 4.5  . Smokeless tobacco: Never Used  Vaping Use  . Vaping Use: Never used  Substance and Sexual Activity  . Alcohol use: No  . Drug use: No  . Sexual activity: Not Currently  Other Topics Concern  . Not on file  Social History Narrative   Fun: Likes to do handyman related projects, photography.    Denies religious beliefs effecting health care.    Social Determinants of Health   Financial Resource Strain:   . Difficulty of Paying Living Expenses: Not on file  Food Insecurity:   . Worried About Charity fundraiser in the Last Year: Not on file  . Ran Out of Food in the Last Year: Not on file  Transportation Needs:   . Lack of Transportation (Medical): Not on file  . Lack of Transportation (Non-Medical): Not on file  Physical Activity:   . Days of Exercise per Week: Not on file  . Minutes of Exercise per Session: Not on file  Stress:   . Feeling of Stress : Not on file  Social  Connections:   . Frequency of Communication with Friends and Family: Not on file  . Frequency of Social Gatherings with Friends and Family: Not on file  . Attends Religious Services: Not on file  . Active Member of Clubs or Organizations: Not on file  . Attends Archivist Meetings: Not on file  . Marital Status: Not on file   Family History  Problem Relation Age of Onset  . Alzheimer's disease Mother   .  Hypertension Mother   . Hyperlipidemia Father   . Hypertension Father   . Healthy Maternal Grandmother   . Pneumonia Maternal Grandfather    Scheduled Meds: . amoxicillin-clavulanate  1 tablet Oral Q12H  . atorvastatin  40 mg Oral Daily  . Chlorhexidine Gluconate Cloth  6 each Topical Q0600  . furosemide  40 mg Oral BID  . heparin  5,000 Units Subcutaneous Q8H  . insulin aspart  0-15 Units Subcutaneous TID WC  . insulin aspart  0-5 Units Subcutaneous QHS  . insulin glargine  4 Units Subcutaneous QHS  . levothyroxine  25 mcg Oral QAC breakfast  . mupirocin ointment  1 application Nasal BID  . sodium bicarbonate  650 mg Oral BID  . sodium chloride flush  3 mL Intravenous Q12H  . sodium chloride flush  3 mL Intravenous Q12H   Continuous Infusions: . sodium chloride     PRN Meds:.sodium chloride, acetaminophen **OR** acetaminophen, albuterol, ondansetron **OR** ondansetron (ZOFRAN) IV, sodium chloride flush No Known Allergies Review of Systems  Unable to perform ROS: Other  Constitutional:       Patient not cooperative with questions or exam    Physical Exam Vitals and nursing note reviewed.  Constitutional:      General: He is not in acute distress.    Appearance: He is ill-appearing.  Cardiovascular:     Rate and Rhythm: Normal rate.  Pulmonary:     Effort: No tachypnea, accessory muscle usage or respiratory distress.  Neurological:     Mental Status: He is alert. He is confused.  Psychiatric:        Behavior: Behavior is agitated.     Vital Signs:  BP (!) 176/82 (BP Location: Left Arm)   Pulse 77   Temp 98.6 F (37 C) (Oral)   Resp 20   Ht '5\' 9"'  (1.753 m)   Wt 83.7 kg   SpO2 99%   BMI 27.25 kg/m  Pain Scale: 0-10   Pain Score: 0-No pain   SpO2: SpO2: 99 % O2 Device:SpO2: 99 % O2 Flow Rate: .O2 Flow Rate (L/min): 15 L/min  IO: Intake/output summary:   Intake/Output Summary (Last 24 hours) at 08/20/2020 0949 Last data filed at 08/20/2020 0800 Gross per 24 hour  Intake 723 ml  Output 1701 ml  Net -978 ml    LBM: Last BM Date: 08/18/20 Baseline Weight: Weight: 83.7 kg Most recent weight: Weight: 83.7 kg     Palliative Assessment/Data:     Time In: 1400 Time Out: 1450 Time Total: 50 min Greater than 50%  of this time was spent counseling and coordinating care related to the above assessment and plan.  Signed by: Vinie Sill, NP Palliative Medicine Team Pager # [phone] (M-F 8a-5p) Team Phone # [phone] (Nights/Weekends)

## 2020-08-20 NOTE — Telephone Encounter (Addendum)
Received fax from [facility] stating need to resent Humalog. Spoken to the pharmacy that they never received the change.  Resent as requested.

## 2020-08-20 NOTE — Progress Notes (Signed)
Pt's family notified of patients discharge. Sister states that she will pick up the patient later this afternoon after she leaves work .

## 2020-08-20 NOTE — Discharge Summary (Addendum)
Physician Discharge Summary  [name] [name] HYI:[id number] DOB: 1945/11/18 DOA: 08/18/2020  PCP: Libby Maw, MD  Admit date: 08/18/2020 Discharge date: 08/20/2020  Admitted From: home Discharge disposition: home   Recommendations for Outpatient Follow-Up:   1. 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   Wound care: None.  Code status: DNR   History of Present Illness:   [name] [name] is a 74 y.o. male with medical history significant for chronic kidney disease stage V, insulin dependent diabetes mellitus, history of CVA, chronic anemia, dementia, and hypertension, now presenting to the emergency department with decreased level of consciousness.  Patient is unable to contribute much of the history which is obtained from ED personnel and the patient's sister with whom he lives.  Patient had just been admitted to the hospital with altered mental status, acute on chronic renal failure, and acute on chronic anemia, was discharged home just a few hours ago, and was found to have decreased level of consciousness.  SNF was recommended by PT during this recent admission with the patient refused this.  Patient's daughter also notes that the attending physician caring for him yesterday felt he may need longer hospital stay but the patient was adamant that he be discharged immediately.  Shortly after returning home, patient was eating a hamburger, was noted to have some coughing and choking, and then went down to his knees and was not responding to their questions.  They called EMS, and by time of EMS arrival the  patient was speaking but having some difficulty breathing.  Ventilations were assisted by EMS and the patient was brought into the ED.   Hospital Course by Problem:   Acute encephalopathy -Presents with decreased LOC shortly after hospital discharge, had been choking on some food just prior to this and appears to have been aspirating -resolved -seems at baseline  Aspiration -Presents with decreased LOC, had been choking and coughing while eating a hamburger just prior to this and is found to have new opacity at right base on CXR and pO2 of 46 on ABG  -refused to participate in SLP exam -carb mod diet -PO abx -transition to hospice as this will likely be a re-occurring event   CKD V -SCr is6.15on admission, had been closer to 5 a couple months ago -Patient's family notes that Mr. [facility] outpatient nephrologist did not feel that he is a dialysis candidate and they are accepting of this -Renally-dose medications, continue bicarbonate and Lasix -transition to hospice  Dementia -appears to be at baseline  Anemia -stable -due to CKD  History of CVA -No acute findings on CT head -Continue statin  Insulin-dependent DM -resume home regimine      Medical Consultants:   Palliative care   Discharge Exam:   Vitals:   08/19/20 2100 08/20/20 0507  BP: (!) 150/70 (!) 176/82  Pulse: 82 77  Resp: 20 20  Temp: 99 F (37.2 C) 98.6 F (37 C)  SpO2: 99% 99%   Vitals:   08/19/20 1732 08/19/20 2018 08/19/20 2100 08/20/20 0507  BP: 134/88  (!) 150/70 (!) 176/82  Pulse: 96  82 77  Resp: _0 Temp: 98.1 F (36.7 C)  99 F (37.2 C) 98.6 F (37 C)  TempSrc: Oral  Oral Oral  SpO2: 98%  99% 99%  Weight:      Height:  5' 9" (1.753 m)      General exam: Appears calm and comfortable.   The results of significant diagnostics from this hospitalization (including imaging, microbiology, ancillary and laboratory) are listed below for  reference.     Procedures and Diagnostic Studies:   CT Head Wo Contrast  Result Date: 08/18/2020 CLINICAL DATA:  Altered mental status, found on floor EXAM: CT HEAD WITHOUT CONTRAST TECHNIQUE: Contiguous axial images were obtained from the base of the skull through the vertex without intravenous contrast. COMPARISON:  CT 08/14/2020 FINDINGS: Brain: Few stable hypoattenuating foci in the subcortical white matter of the bilateral frontal lobes, likely to reflect areas of prior lacunar type infarcts. No evidence of acute infarction, hemorrhage, hydrocephalus, extra-axial collection, visible mass lesion or mass effect. Prominence of the ventricles, cisterns and sulci compatible with moderate frontal predominant parenchymal volume loss. Patchy areas of white matter hypoattenuation are most compatible with chronic microvascular angiopathy. Vascular: Atherosclerotic calcification of the carotid siphons and intradural vertebral arteries. No hyperdense vessel. Skull: No calvarial fracture or suspicious osseous lesion. No scalp swelling or hematoma. Sinuses/Orbits: Mild mural thickening in the left maxillary and ethmoid sinuses. Subtotal opacification left sphenoid sinus with small bubbly lucency/air-fluid level. Included orbital structures are unremarkable. Other: None. IMPRESSION: 1. No acute intracranial abnormality. 2. Stable moderate frontal predominant parenchymal volume loss and chronic microvascular angiopathy. 3. Stable remote lacunar type infarcts in the subcortical white matter of the bilateral frontal lobes. 4. Layering air-fluid level in the left sphenoid sinus with more mild mural disease in maxillary and ethmoid sinuses, could reflect an acute on chronic sinusitis. Correlate with clinical symptoms. Electronically Signed   By: Lovena Le M.D.   On: 08/18/2020 23:19   DG Chest Port 1 View  Result Date: 08/19/2020 CLINICAL DATA:  Respiratory failure. EXAM: PORTABLE CHEST 1 VIEW COMPARISON:  August 18, 2020 FINDINGS: Again noted are ill-defined streaky opacities at the lung bases bilaterally, slightly worsened from prior study. The heart size is stable. There is no pneumothorax. There may be trace bilateral pleural effusions. Aortic calcifications are again noted. There is no acute osseous abnormality. IMPRESSION: 1. Worsening streaky bibasilar airspace opacities which may represent atelectasis or developing infiltrates. 2. Possible trace bilateral pleural effusions. 3. Stable cardiac silhouette. Electronically Signed   By: Constance Holster M.D.   On: 08/19/2020 02:06   DG Chest Port 1 View  Result Date: 08/18/2020 CLINICAL DATA:  Wheezing and altered mental status. EXAM: PORTABLE CHEST 1 VIEW COMPARISON:  08/14/2020 FINDINGS: Lower lung volumes from recent exam. Ill-defined opacity at the right lung base, new. Stable heart size and mediastinal contours. No pneumothorax. No large subpulmonic effusion. Trace perifissural scarring in the right lung, chronic. No pulmonary edema. No acute osseous abnormalities are seen. IMPRESSION: Ill-defined opacity at the right lung base, new from recent exam, may be atelectasis or pneumonia. Electronically Signed   By: Keith Rake M.D.   On: 08/18/2020 22:09     Labs:   Basic Metabolic Panel: Recent Labs  Lab 08/14/20 2201 08/15/20 0017 08/17/20 0126 08/17/20 0126 08/18/20 1739 08/18/20 1739 08/18/20 2305 08/18/20 2305 08/18/20 2330 08/18/20 2330 08/19/20 1822 08/20/20 0203  NA  --    < > 138   < > 139  --  139  --  138  --  139 141  K  --    < > 4.3   < > 4.2   < > 4.4   < > 4.5   < > 4.6 4.7  CL  --    < > 107  --  109  --   --   --  107  --  109 109  CO2  --    < > 20*  --  20*  --   --   --  22  --  20* 20*  GLUCOSE  --    < > 134*  --  92  --   --   --  95  --  220* 170*  BUN  --    < > 68*  --  73*  --   --   --  76*  --  76* 77*  CREATININE  --    < > 6.31*  --  6.01*  --   --   --  6.15*  --  6.43* 6.56*  CALCIUM  --    < > 7.6*   --  7.7*  --   --   --  7.6*  --  8.0* 8.0*  MG 2.2  --   --   --   --   --   --   --   --   --   --   --    < > = values in this interval not displayed.   GFR Estimated Creatinine Clearance: 9.7 mL/min (A) (by C-G formula based on SCr of 6.56 mg/dL (H)). Liver Function Tests: Recent Labs  Lab 08/14/20 1712 08/18/20 1739 08/18/20 2330  AST _0 ALT _1 ALKPHOS 53 45 47  BILITOT 1.0 <0.1* 0.4  PROT 5.5* 5.6* 5.5*  ALBUMIN 2.3* 2.2* 2.1*   No results for input(s): LIPASE, AMYLASE in the last 168 hours. Recent Labs  Lab 08/14/20 1712 08/18/20 2331  AMMONIA 26 22   Coagulation profile No results for input(s): INR, PROTIME in the last 168 hours.  CBC: Recent Labs  Lab 08/14/20 1712 08/14/20 1853 08/16/20 0158 08/16/20 0158 08/17/20 0126 08/17/20 0126 08/18/20 1739 08/18/20 2305 08/18/20 2330 08/19/20 1822 08/20/20 0203  WBC 6.7   < > 6.3   < > 6.4  --  7.1  --  8.7 5.8 6.2  NEUTROABS 5.2  --  3.2  --  3.6  --   --   --  5.9  --   --   HGB 6.8*   < > 8.5*   < > 8.8*   < > 8.5* 8.8* 8.1* 7.5* 8.2*  HCT 22.5*   < > 26.4*   < > 27.5*   < > 26.5* 26.0* 26.3* 24.1* 26.9*  MCV 98.7   < > 94.3   < > 93.9  --  94.3  --  99.2 96.0 97.1  PLT 260   < > 283   < > 279  --  282  --  261 239 251   < > = values in this interval not displayed.   Cardiac Enzymes: No results for input(s): CKTOTAL, CKMB, CKMBINDEX, TROPONINI in the last 168 hours. BNP: Invalid input(s): POCBNP CBG: Recent Labs  Lab 08/19/20 1640 08/19/20 2100 08/20/20 0032 08/20/20 0501 08/20/20 0732  GLUCAP 270* 225* 219* 85 114*   D-Dimer No results for input(s): DDIMER in the last 72 hours.  Hgb A1c No results for input(s): HGBA1C in the last 72 hours. Lipid Profile No results for input(s): CHOL, HDL, LDLCALC, TRIG, CHOLHDL, LDLDIRECT in the last 72 hours. Thyroid function studies Recent Labs    08/18/20 2332  TSH 9.376*   Anemia work up No results for input(s): VITAMINB12, FOLATE,  FERRITIN, TIBC, IRON, RETICCTPCT in the last 72 hours. Microbiology Recent Results (from the past 240 hour(s))  Respiratory Panel by RT PCR (Flu A&B, Covid) - Nasopharyngeal Swab     Status: None   Collection Time: 08/14/20  8:18 PM   Specimen: Nasopharyngeal Swab  Result Value Ref Range Status   SARS Coronavirus 2 by RT PCR NEGATIVE NEGATIVE Final    Comment: (NOTE) SARS-CoV-2 target nucleic acids are NOT DETECTED.  The SARS-CoV-2 RNA is generally detectable in upper respiratoy specimens during the acute phase of infection. The lowest concentration of SARS-CoV-2 viral copies this assay can detect is 131 copies/mL. A negative result does not preclude SARS-Cov-2 infection and should not be used as the sole basis for treatment or other patient management decisions. A negative result may occur with  improper specimen collection/handling, submission of specimen other than nasopharyngeal swab, presence of viral mutation(s) within the areas targeted by this assay, and inadequate number of viral copies (<131 copies/mL). A negative result must be combined with clinical observations, patient history, and epidemiological information. The expected result is Negative.  Fact Sheet for Patients:  PinkCheek.be  Fact Sheet for Healthcare Providers:  GravelBags.it  This test is no t yet approved or cleared by the [place] FDA and  has been authorized for detection and/or diagnosis of SARS-CoV-2 by FDA under an Emergency Use Authorization (EUA). This EUA will remain  in effect (meaning this test can be used) for the duration of the COVID-19 declaration under Section 564(b)(1) of the Act, 21 U.S.C. section 360bbb-3(b)(1), unless the authorization is terminated or revoked sooner.     Influenza A by PCR NEGATIVE NEGATIVE Final   Influenza B by PCR NEGATIVE NEGATIVE Final    Comment: (NOTE) The Xpert Xpress SARS-CoV-2/FLU/RSV assay is  intended as an aid in  the diagnosis of influenza from Nasopharyngeal swab specimens and  should not be used as a sole basis for treatment. Nasal washings and  aspirates are unacceptable for Xpert Xpress SARS-CoV-2/FLU/RSV  testing.  Fact Sheet for Patients: PinkCheek.be  Fact Sheet for Healthcare Providers: GravelBags.it  This test is not yet approved or cleared by the [place] FDA and  has been authorized for detection and/or diagnosis of SARS-CoV-2 by  FDA under an Emergency Use Authorization (EUA). This EUA will remain  in effect (meaning this test can be used) for the duration of the  Covid-19 declaration under Section 564(b)(1) of the Act, 21  U.S.C. section 360bbb-3(b)(1), unless the authorization is  terminated or revoked. Performed at [facility], [place] [address], [place], [place] [address]   Respiratory Panel by RT PCR (Flu A&B, Covid) - Nasopharyngeal Swab     Status: None   Collection Time: 08/19/20  1:50 AM   Specimen: Nasopharyngeal Swab  Result Value Ref Range Status   SARS Coronavirus 2 by RT PCR NEGATIVE NEGATIVE Final    Comment: (NOTE) SARS-CoV-2 target nucleic acids are NOT DETECTED.  The SARS-CoV-2 RNA is generally detectable in upper respiratoy specimens during the acute phase of infection. The lowest concentration of SARS-CoV-2 viral copies this assay can detect is 131 copies/mL. A negative result does not preclude SARS-Cov-2 infection and  should not be used as the sole basis for treatment or other patient management decisions. A negative result may occur with  improper specimen collection/handling, submission of specimen other than nasopharyngeal swab, presence of viral mutation(s) within the areas targeted by this assay, and inadequate number of viral copies (<131 copies/mL). A negative result must be combined with clinical observations, patient history, and epidemiological  information. The expected result is Negative.  Fact Sheet for Patients:  PinkCheek.be  Fact Sheet for Healthcare Providers:  GravelBags.it  This test is no t yet approved or cleared by the [place] FDA and  has been authorized for detection and/or diagnosis of SARS-CoV-2 by FDA under an Emergency Use Authorization (EUA). This EUA will remain  in effect (meaning this test can be used) for the duration of the COVID-19 declaration under Section 564(b)(1) of the Act, 21 U.S.C. section 360bbb-3(b)(1), unless the authorization is terminated or revoked sooner.     Influenza A by PCR NEGATIVE NEGATIVE Final   Influenza B by PCR NEGATIVE NEGATIVE Final    Comment: (NOTE) The Xpert Xpress SARS-CoV-2/FLU/RSV assay is intended as an aid in  the diagnosis of influenza from Nasopharyngeal swab specimens and  should not be used as a sole basis for treatment. Nasal washings and  aspirates are unacceptable for Xpert Xpress SARS-CoV-2/FLU/RSV  testing.  Fact Sheet for Patients: PinkCheek.be  Fact Sheet for Healthcare Providers: GravelBags.it  This test is not yet approved or cleared by the [place] FDA and  has been authorized for detection and/or diagnosis of SARS-CoV-2 by  FDA under an Emergency Use Authorization (EUA). This EUA will remain  in effect (meaning this test can be used) for the duration of the  Covid-19 declaration under Section 564(b)(1) of the Act, 21  U.S.C. section 360bbb-3(b)(1), unless the authorization is  terminated or revoked. Performed at [facility], [place] [address], [place], [place] [address]   MRSA PCR Screening     Status: Abnormal   Collection Time: 08/19/20  1:50 AM   Specimen: Nasal Mucosa; Nasopharyngeal  Result Value Ref Range Status   MRSA by PCR POSITIVE (A) NEGATIVE Final    Comment: CRITICAL RESULT CALLED TO, READ BACK BY  AND VERIFIED WITH: RN, K. GENGLER [id number]  THANEY Performed at [facility], [place] [address], [place], [place] [address]      Discharge Instructions:   Discharge Instructions    Diet Carb Modified   Complete by: As directed    Increase activity slowly   Complete by: As directed      Allergies as of 08/20/2020   No Known Allergies     Medication List    STOP taking these medications   aspirin 81 MG EC tablet     TAKE these medications   Accu-Chek Guide w/Device Kit 1 Device by Does not apply route QID.   acetaminophen 325 MG tablet Commonly known as: Tylenol Take 2 tablets (650 mg total) by mouth every 6 (six) hours as needed for up to 30 doses for mild pain or moderate pain.   amLODipine 10 MG tablet Commonly known as: NORVASC Take 1 tablet (10 mg total) by mouth daily.   amoxicillin-clavulanate 500-125 MG tablet Commonly known as: AUGMENTIN Take 1 tablet (500 mg total) by mouth daily.   atorvastatin 40 MG tablet Commonly known as: LIPITOR Take 1 tablet (40 mg total) by mouth daily.   Basaglar KwikPen 100 UNIT/ML Inject 5 Units into the skin at bedtime.  cyanocobalamin 1000 MCG tablet Take 1 tablet (1,000 mcg total) by mouth daily.   furosemide 40 MG tablet Commonly known as: LASIX Take 40 mg by mouth 2 (two) times daily.   glucose blood test strip Commonly known as: True Metrix Blood Glucose Test CHECK BLOOD SUGAR UP TO 4 TIMES A DAY. What changed:   how much to take  how to take this  when to take this  additional instructions   glucose blood test strip Commonly known as: Accu-Chek Guide Use as instructed What changed: Another medication with the same name was changed. Make sure you understand how and when to take each.   Accu-Chek Guide test strip Generic drug: glucose blood USE AS DIRECTED What changed: Another medication with the same name was changed. Make sure you understand how and when to take each.   hydrALAZINE 100 MG  tablet Commonly known as: APRESOLINE TAKE 1 TABLET BY MOUTH 3 TIMES A DAY.   insulin lispro 100 UNIT/ML KwikPen Commonly known as: HumaLOG KwikPen Inject 0-10 Units into the skin 3 (three) times daily as needed (blood sugar). Max daily 30 units   Insulin Pen Needle 32G X 4 MM Misc 1 Device by Does not apply route in the morning, at noon, in the evening, and at bedtime.   Insulin Syringe-Needle U-100 31G X 5/16" 0.3 ML Misc Commonly known as: BD Insulin Syringe U/F Inject 1 each as directed 2 (two) times daily. as directed   levothyroxine 25 MCG tablet Commonly known as: SYNTHROID Take 1 tablet (25 mcg total) by mouth daily before breakfast.   sodium bicarbonate 650 MG tablet Take 1 tablet (650 mg total) by mouth 2 (two) times daily.   Vitamin D (Ergocalciferol) 1.25 MG (50000 UNIT) Caps capsule Commonly known as: DRISDOL Take 50,000 Units by mouth every 7 (seven) days. Thursdays       Follow-up Information    Libby Maw, MD Follow up in 1 week(s).   Specialty: Family Medicine Contact information: [place] [address] [phone]                Time coordinating discharge: 35 min  Signed:  [name] DO  [facility] 08/20/2020, 9:53 AM

## 2020-08-20 NOTE — Progress Notes (Signed)
  Speech Language Pathology Treatment: Dysphagia  Patient Details Name: [name] [name] MRN: [id number] DOB: [birth year] Today's Date: 08/20/2020 Time: 0211-1735 SLP Time Calculation (min) (ACUTE ONLY): 10 min  Assessment / Plan / Recommendation Clinical Impression  F/u after yesterday's swallow assessment.  RN reports occasional coughing, but overall did well with breakfast with no overt s/s of dysphagia and swallowed all his pills with water without difficulty.  [name] [name] was willing to participate in limited f/u - he drank water in my presence but refused all further POs (solid foods; brought him coffee at his request but he did not want to drink it immediately).  He stated that he sometimes coughs while drinking and talking simultaneously, but denied having ongoing issues and declined further assessment.     He may have an intermittent dysphagia associated with changes in mental status, but today he appears functional from limited discussion/observation/chart review.  He is reluctant to participate further and doesn't respond well to persuasion.  He appears to be protecting his airway at this given moment.  No further acute care SLP f/u is wanted by pt.  Our service will sign off.    HPI HPI: [name] [name] is an 74 y.o. male with medical history significant for chronic kidney disease stage V, insulin dependent diabetes mellitus, history of CVA, chronic anemia, dementia, and hypertension, now presenting to the emergency department with decreased level of consciousness.  Patient is unable to contribute much of the history which is obtained from ED personnel and the patient's sister with whom he lives.  Patient had just been admitted to the hospital with altered mental status, acute on chronic renal failure, and acute on chronic anemia, was discharged home just a few hours ago, and was found to have decreased level of consciousness.  SNF was recommended by PT during this recent admission with the patient  refused this. Reports noted that patient had been choking on some food just prior to decreased LOC. CXR with new opacity at the right base. Per family, patine thas been coughing/choking when eating for some time. Patient was seen by SLP services in 2018 with recommendations for dysphagia 1 with pudding thick liquids, suspected chronic dysphagia exacerbated by intubation at that time.       SLP Plan  All goals met       Recommendations  Diet recommendations: Regular;Thin liquid Liquids provided via: Cup;Straw Medication Administration: Whole meds with liquid Supervision: Patient able to self feed Postural Changes and/or Swallow Maneuvers: Seated upright 90 degrees                Oral Care Recommendations: Oral care BID Follow up Recommendations: None SLP Visit Diagnosis: Dysphagia, unspecified (R13.10) Plan: All goals met       GO                [name] [name] 08/20/2020, 9:05 AM  [name] [name], [place] Office number [phone] Pager [phone]

## 2020-08-31 ENCOUNTER — Telehealth: Payer: Self-pay

## 2020-08-31 NOTE — Telephone Encounter (Signed)
Left a voicemail for patient to return call. Per Enid Derry trying to see if patient can move appointment from 1 PM on 11/23 to 10 AM.

## 2020-09-04 ENCOUNTER — Other Ambulatory Visit: Payer: Self-pay

## 2020-09-04 ENCOUNTER — Telehealth: Payer: Self-pay | Admitting: Family Medicine

## 2020-09-04 ENCOUNTER — Other Ambulatory Visit: Payer: Medicare Other | Admitting: Internal Medicine

## 2020-09-04 DIAGNOSIS — Z515 Encounter for palliative care: Secondary | ICD-10-CM

## 2020-09-04 DIAGNOSIS — Z7189 Other specified counseling: Secondary | ICD-10-CM

## 2020-09-04 DIAGNOSIS — N185 Chronic kidney disease, stage 5: Secondary | ICD-10-CM

## 2020-09-04 NOTE — Telephone Encounter (Signed)
Enid Derry from [place] is calling to let the office know that patient is refusing care. She would like to speak to the nurse to discuss this. Please give her a call back at [phone].

## 2020-09-05 NOTE — Progress Notes (Signed)
[place] Consult Note Telephone: [phone]  Fax: [phone]  PATIENT NAME: [name] [name] DOB: 1946-09-07 MRN: [id number]  PRIMARY CARE PROVIDER:   Libby Maw, MD  REFERRING PROVIDER:  Dr. Candiss Norse  RESPONSIBLE PARTY:   Self     RECOMMENDATIONS and PLAN:  Palliative care encounter  Z51.5  1.  Advance care planning:  Readdressed advanced directives with patient and sister [name]. MOST form was completed with pt's delegations of DNAR/DNI, Comfort measures, antibiotics if indicated, no IV fluids or tube feedings.  MOST form was uploaded into Epic and original was left with patient.  Sister [name] reported that she is not in support of his DNR decision and she would seek emergency assistance for him if necessary.  Patient is his own Media planner. Palliative care will followup with paitne in aprox 4 weeks.  2. Chronic kidney disease stage V:  Most recent labs:  crea 5.35  GFR 11.  Pt expressed no plans to begin hemodialysis if recommended. Supportive care. Avoid nephro toxins.  Continue f/u with nephrologist.  3.  Edema:  Related to CKD.  Monitor hydration,  sodium use and daily weights. Report > 3# daily weight gain to provider as directed. Diuretics as prescribed. Compression socks and leg elevation.  I spent 60 minutes providing this consultation,  from 1500 to 1600. More than 50% of the time in this consultation was spent coordinating communication with patient and sister.   HISTORY OF PRESENT ILLNESS: F/U with [name].  Sister reports that patient continues to have edema of lower extremities and generalized fatigue. Reports that he eats well but may take his medications not as prescribed.   Palliative Care was asked to help address goals of care.   CODE STATUS: DNAR/DNI  PPS: 50% very weak  HOSPICE ELIGIBILITY/DIAGNOSIS: TBD  PAST MEDICAL HISTORY:  Past Medical History:  Diagnosis Date  . 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 Closed comminuted intertrochanteric fracture of left femur ([place])   . Diabetes mellitus without complication ([place])   . Diabetic hyperosmolar non-ketotic state ([place]) 05/15/2016  . DKA (diabetic ketoacidoses) 05/09/2016  . Hypertension   . Hypoglycemia 11/29/2019  . Postoperative anemia due to acute blood loss 06/18/2016  . Retroperitoneal hematoma 06/18/2016  . Stroke ([place])   . Vitamin B 12 deficiency 06/18/2016    SOCIAL HX: Lives with sister, centarian mother and brother who has Downs Syndrome Social History   Tobacco Use  . Smoking status: Former Smoker    Packs/day: 0.25    Years: 30.00    Pack years: 7.50    Types: Cigarettes    Quit date: 02/01/2016    Years since quitting: 4.5  . Smokeless tobacco: Never Used  Substance Use Topics  . Alcohol use: No    ALLERGIES: No Known Allergies    PERTINENT MEDICATIONS:  Outpatient Encounter Medications as of 08/01/2020  Medication Sig  . ACCU-CHEK GUIDE test strip USE AS DIRECTED  . acetaminophen (TYLENOL) 325 MG tablet Take 2 tablets (650 mg total) by mouth every 6 (six) hours as needed for up to 30 doses for mild pain or moderate pain.  Marland Kitchen amLODipine (NORVASC) 10 MG tablet Take 1 tablet (10 mg total) by mouth daily.  Marland Kitchen atorvastatin (LIPITOR) 40 MG tablet Take 1 tablet (40 mg total) by mouth daily.  . Blood Glucose Monitoring Suppl (ACCU-CHEK GUIDE) w/Device KIT 1 Device by Does not apply route QID.  Marland Kitchen  glucose blood (ACCU-CHEK GUIDE) test strip Use as instructed  . glucose blood (TRUE METRIX BLOOD GLUCOSE TEST) test strip CHECK BLOOD SUGAR UP TO 4 TIMES A DAY. (Patient taking differently: 1 each by Other route See admin instructions. CHECK BLOOD SUGAR UP TO 2 TIMES A DAY.)  . hydrALAZINE (APRESOLINE) 100 MG tablet TAKE 1 TABLET BY MOUTH 3 TIMES A DAY.  Marland Kitchen Insulin Pen Needle 32G X 4 MM MISC 1 Device by Does not apply route in the morning, at noon, in the evening, and at bedtime.   . Insulin Syringe-Needle U-100 (BD INSULIN SYRINGE U/F) 31G X 5/16" 0.3 ML MISC Inject 1 each as directed 2 (two) times daily. as directed  . levothyroxine (SYNTHROID) 25 MCG tablet Take 1 tablet (25 mcg total) by mouth daily before breakfast.  . sodium bicarbonate 650 MG tablet Take 1 tablet (650 mg total) by mouth 2 (two) times daily.  . vitamin B-12 1000 MCG tablet Take 1 tablet (1,000 mcg total) by mouth daily.  . Vitamin D, Ergocalciferol, (DRISDOL) 1.25 MG (50000 UNIT) CAPS capsule Take 50,000 Units by mouth every 7 (seven) days. Thursdays  . [DISCONTINUED] aspirin EC 81 MG EC tablet Take 1 tablet (81 mg total) by mouth daily. Swallow whole. (Patient not taking: Reported on 08/14/2020)  . [DISCONTINUED] furosemide (LASIX) 20 MG tablet Take 1 tablet (20 mg total) by mouth daily. (Patient not taking: Reported on 08/14/2020)  . [DISCONTINUED] Insulin Glargine (BASAGLAR KWIKPEN) 100 UNIT/ML Inject 6 Units into the skin daily. (Patient taking differently: Inject 5 Units into the skin at bedtime. )  . [DISCONTINUED] insulin lispro (ADMELOG SOLOSTAR) 100 UNIT/ML KwikPen Max daily 30 units daily   No facility-administered encounter medications on file as of 08/01/2020.    PHYSICAL EXAM:   General: NAD, frail appearing,ekderly male sitting at DR table Cardiovascular: regular rate and rhythm Pulmonary: no increased work of breathing Abdomen: soft, nontender, + bowel sounds Extremities: 2+ edema BLE Skin: expose skin is intact Neurological: A&O x3, generalized weakness  Psych:  Calm and cooperative  Gonzella Lex, NP-C

## 2020-09-05 NOTE — Telephone Encounter (Signed)
Tried returning [name] [name]'s call from [facility] number provided was patients sister. Per sister doing okay and she's not for sure about a [name] [name].

## 2020-09-10 NOTE — Progress Notes (Signed)
Subjective:   [name] [name] is a 74 y.o. male who presents for an Initial Medicare Annual Wellness Visit.  I connected with [name] [name] today by telephone and verified that I am speaking with the correct person using two identifiers. Location patient: home Location provider: work Persons participating in the virtual visit: patient, Marine scientist.    I discussed the limitations, risks, security and privacy concerns of performing an evaluation and management service by telephone and the availability of in person appointments. I also discussed with the patient that there may be a patient responsible charge related to this service. The patient expressed understanding and verbally consented to this telephonic visit.    Interactive audio and video telecommunications were attempted between this provider and patient, however failed, due to patient having technical difficulties OR patient did not have access to video capability.  We continued and completed visit with audio only.  Some vital signs may be absent or patient reported.   Time Spent with patient on telephone encounter: 25 minutes   Review of Systems     Cardiac Risk Factors include: advanced age (>36mn, >>47women);diabetes mellitus;dyslipidemia;hypertension;sedentary lifestyle;male gender     Objective:    Today's Vitals   09/11/20 0902  Weight: 162 lb (73.5 kg)  Height: '5\' 9"'  (1.753 m)   Body mass index is 23.92 kg/m.  Advanced Directives 09/11/2020 08/14/2020 07/06/2020 06/10/2020 04/23/2020 04/23/2020 04/22/2020  Does Patient Have a Medical Advance Directive? No Yes No No No - No  Type of Advance Directive - [place] - - - - -  Does patient want to make changes to medical advance directive? - No - Patient declined - - - - -  Copy of [place] Chart? - Yes - validated most recent copy scanned in chart (See row information), Physician notified - - - - -  Would patient like information on  creating a medical advance directive? No - Patient declined - No - Patient declined No - Patient declined No - Patient declined No - Patient declined -  Pre-existing out of facility DNR order (yellow form or pink MOST form) - - - - - - -  Some encounter information is confidential and restricted. Go to Review Flowsheets activity to see all data.    Current Medications (verified) Outpatient Encounter Medications as of 09/11/2020  Medication Sig  . ACCU-CHEK GUIDE test strip USE AS DIRECTED  . acetaminophen (TYLENOL) 325 MG tablet Take 2 tablets (650 mg total) by mouth every 6 (six) hours as needed for up to 30 doses for mild pain or moderate pain.  .Marland KitchenamLODipine (NORVASC) 10 MG tablet Take 1 tablet (10 mg total) by mouth daily.  .Marland Kitchenatorvastatin (LIPITOR) 40 MG tablet Take 1 tablet (40 mg total) by mouth daily.  . Blood Glucose Monitoring Suppl (ACCU-CHEK GUIDE) w/Device KIT 1 Device by Does not apply route QID.  . furosemide (LASIX) 40 MG tablet Take 40 mg by mouth 2 (two) times daily.  .Marland Kitchenglucose blood (ACCU-CHEK GUIDE) test strip Use as instructed  . glucose blood (TRUE METRIX BLOOD GLUCOSE TEST) test strip CHECK BLOOD SUGAR UP TO 4 TIMES A DAY. (Patient taking differently: 1 each by Other route See admin instructions. CHECK BLOOD SUGAR UP TO 2 TIMES A DAY.)  . hydrALAZINE (APRESOLINE) 100 MG tablet TAKE 1 TABLET BY MOUTH 3 TIMES A DAY.  .Marland KitchenInsulin Glargine (BASAGLAR KWIKPEN) 100 UNIT/ML Inject 5 Units into the skin at bedtime.  . insulin lispro (  HUMALOG KWIKPEN) 100 UNIT/ML KwikPen Inject 0-10 Units into the skin 3 (three) times daily as needed (blood sugar). Max daily 30 units  . Insulin Pen Needle 32G X 4 MM MISC 1 Device by Does not apply route in the morning, at noon, in the evening, and at bedtime.  . Insulin Syringe-Needle U-100 (BD INSULIN SYRINGE U/F) 31G X 5/16" 0.3 ML MISC Inject 1 each as directed 2 (two) times daily. as directed  . levothyroxine (SYNTHROID) 25 MCG tablet Take 1 tablet  (25 mcg total) by mouth daily before breakfast.  . sodium bicarbonate 650 MG tablet Take 1 tablet (650 mg total) by mouth 2 (two) times daily.  . vitamin B-12 1000 MCG tablet Take 1 tablet (1,000 mcg total) by mouth daily.  . Vitamin D, Ergocalciferol, (DRISDOL) 1.25 MG (50000 UNIT) CAPS capsule Take 50,000 Units by mouth every 7 (seven) days. Thursdays  . [DISCONTINUED] amoxicillin-clavulanate (AUGMENTIN) 500-125 MG tablet Take 1 tablet (500 mg total) by mouth daily.   No facility-administered encounter medications on file as of 09/11/2020.    Allergies (verified) Patient has no known allergies.   History: Past Medical History:  Diagnosis Date  . Cerebral infarction due to thrombosis of right posterior cerebral artery ([place]) 06/08/2015  . Closed comminuted intertrochanteric fracture of left femur ([place])   . Diabetes mellitus without complication ([place])   . Diabetic hyperosmolar non-ketotic state ([place]) 05/15/2016  . DKA (diabetic ketoacidoses) 05/09/2016  . Hypertension   . Hypoglycemia 11/29/2019  . Postoperative anemia due to acute blood loss 06/18/2016  . Retroperitoneal hematoma 06/18/2016  . Stroke ([place])   . Vitamin B 12 deficiency 06/18/2016   Past Surgical History:  Procedure Laterality Date  . HIP ARTHROPLASTY Right 02/05/2013   Procedure: ARTHROPLASTY BIPOLAR HIP;  Surgeon: [name], MD;  Location: [facility];  Service: Orthopedics;  Laterality: Right;  . INTRAMEDULLARY (IM) NAIL INTERTROCHANTERIC Left 06/16/2016   Procedure: INTRAMEDULLARY (IM) NAIL INTERTROCHANTRIC;  Surgeon: Rod Can, MD;  Location: [facility];  Service: Orthopedics;  Laterality: Left;   Family History  Problem Relation Age of Onset  . Alzheimer's disease Mother   . Hypertension Mother   . Hyperlipidemia Father   . Hypertension Father   . Healthy Maternal Grandmother   . Pneumonia Maternal Grandfather    Social History   Socioeconomic History  . Marital status: Single    Spouse name: Not on file  .  Number of children: 2  . Years of education: 44  . Highest education level: Not on file  Occupational History  . Occupation: Retired  Tobacco Use  . Smoking status: Never Smoker  . Smokeless tobacco: Never Used  Vaping Use  . Vaping Use: Never used  Substance and Sexual Activity  . Alcohol use: No  . Drug use: No  . Sexual activity: Not Currently  Other Topics Concern  . Not on file  Social History Narrative   Fun: Likes to do handyman related projects, photography.    Denies religious beliefs effecting health care.    Social Determinants of Health   Financial Resource Strain: Low Risk   . Difficulty of Paying Living Expenses: Not very hard  Food Insecurity: Food Insecurity Present  . Worried About Charity fundraiser in the Last Year: Sometimes true  . Ran Out of Food in the Last Year: Never true  Transportation Needs: No Transportation Needs  . Lack of Transportation (Medical): No  . Lack of Transportation (Non-Medical): No  Physical Activity: Inactive  .  Days of Exercise per Week: 0 days  . Minutes of Exercise per Session: 0 min  Stress: No Stress Concern Present  . Feeling of Stress : Not at all  Social Connections: Socially Isolated  . Frequency of Communication with Friends and Family: More than three times a week  . Frequency of Social Gatherings with Friends and Family: More than three times a week  . Attends Religious Services: Never  . Active Member of Clubs or Organizations: No  . Attends Archivist Meetings: Never  . Marital Status: Never married    Tobacco Counseling Counseling given: Not Answered   Clinical Intake:  Pre-visit preparation completed: Yes  Pain : No/denies pain     Nutritional Status: BMI of 19-24  Normal Nutritional Risks: None Diabetes: Yes CBG done?: No Did pt. bring in CBG monitor from home?: No (phone visit)  How often do you need to have someone help you when you read instructions, pamphlets, or other written  materials from your doctor or pharmacy?: 1 - Never What is the last grade level you completed in school?: Some college  Diabetes:  Is the patient diabetic?  Yes  If diabetic, was a CBG obtained today?  No  Did the patient bring in their glucometer from home?  No phone visit How often do you monitor your CBG's? daily.   Financial Strains and Diabetes Management:  Are you having any financial strains with the device, your supplies or your medication? No .  Does the patient want to be seen by Chronic Care Management for management of their diabetes?  No  Would the patient like to be referred to a Nutritionist or for Diabetic Management?  No   Diabetic Exams:  Diabetic Eye Exam: . Overdue for diabetic eye exam. Pt has been advised about the importance in completing this exam. Patient to make an appt  Diabetic Foot Exam:  Pt has been advised about the importance in completing this exam. To be completed by PCP    Interpreter Needed?: No  Information entered by :: Caroleen Hamman LPN   Activities of Daily Living In your present state of health, do you have any difficulty performing the following activities: 09/11/2020 08/15/2020  Hearing? N Y  Vision? N N  Difficulty concentrating or making decisions? N Y  Comment - -  Walking or climbing stairs? N Y  Dressing or bathing? N Y  Doing errands, shopping? N -  Preparing Food and eating ? N -  Using the Toilet? N -  In the past six months, have you accidently leaked urine? N -  Do you have problems with loss of bowel control? N -  Managing your Medications? N -  Managing your Finances? N -  Housekeeping or managing your Housekeeping? N -  Some recent data might be hidden    Patient Care Team: Libby Maw, MD as PCP - General (Family Medicine)  Indicate any recent Medical Services you may have received from other than [facility] providers in the past year (date may be approximate).     Assessment:   This is a routine  wellness examination for [name] [name].  Hearing/Vision screen  Hearing Screening   '125Hz'  '250Hz'  '500Hz'  '1000Hz'  '2000Hz'  '3000Hz'  '4000Hz'  '6000Hz'  '8000Hz'   Right ear:           Left ear:           Comments: No issues  Vision Screening Comments: Last eye exam-unsure of date  Dietary issues and exercise activities discussed:  Current Exercise Habits: The patient does not participate in regular exercise at present, Exercise limited by: None identified  Goals    . Patient Stated     Continue eating healthy & drinking lots of water      Depression Screen PHQ 2/9 Scores 09/11/2020 03/16/2019 05/25/2018 05/12/2017 01/29/2017  PHQ - 2 Score 0 0 0 0 0    Fall Risk Fall Risk  09/11/2020 07/10/2020 12/08/2019 11/09/2019 03/16/2019  Falls in the past year? 1 1 0 1 0  Number falls in past yr: 1 1 - 0 0  Injury with Fall? 1 1 - 1 0  Comment - bruised ribs - - -  Risk for fall due to : History of fall(s) - - - -  Risk for fall due to: Comment - - - - -  Follow up Falls prevention discussed - - - -    Any stairs in or around the home? No  Home free of loose throw rugs in walkways, pet beds, electrical cords, etc? Yes  Adequate lighting in your home to reduce risk of falls? Yes   ASSISTIVE DEVICES UTILIZED TO PREVENT FALLS:  Life alert? No  Use of a cane, walker or w/c? No  Grab bars in the bathroom? Yes  Shower chair or bench in shower? No  Elevated toilet seat or a handicapped toilet? No   TIMED UP AND GO:  Was the test performed? No . Phone visit   Cognitive Function:No cognitive impairment noted        Immunizations Immunization History  Administered Date(s) Administered  . Fluad Quad(high Dose 65+) 11/24/2019  . Influenza, High Dose Seasonal PF 08/11/2016, 06/28/2017, 10/04/2018  . Pneumococcal Conjugate-13 03/16/2019  . Pneumococcal Polysaccharide-23 03/14/2016  . Tdap 01/29/2017    TDAP status: Up to date   Flu vaccine status: Due-Patient plans to get vaccine soon.  Pneumococcal  vaccine status: Up to date   Covid-19 vaccine status: Completed vaccines Patient states but does not know dates.  Qualifies for Shingles Vaccine? Yes   Zostavax completed No   Shingrix Completed?: No.    Education has been provided regarding the importance of this vaccine. Patient has been advised to call insurance company to determine out of pocket expense if they have not yet received this vaccine. Advised may also receive vaccine at local pharmacy or Health Dept. Verbalized acceptance and understanding.  Screening Tests Health Maintenance  Topic Date Due  . COVID-19 Vaccine (1) Never done  . COLONOSCOPY  Never done  . OPHTHALMOLOGY EXAM  10/14/2016  . FOOT EXAM  05/12/2018  . INFLUENZA VACCINE  05/13/2020  . HEMOGLOBIN A1C  02/12/2021  . TETANUS/TDAP  01/30/2027  . Hepatitis C Screening  Completed  . PNA vac Low Risk Adult  Completed    Health Maintenance  Health Maintenance Due  Topic Date Due  . COVID-19 Vaccine (1) Never done  . COLONOSCOPY  Never done  . OPHTHALMOLOGY EXAM  10/14/2016  . FOOT EXAM  05/12/2018  . INFLUENZA VACCINE  05/13/2020    Colorectal cancer screening: Patient declined  Lung Cancer Screening: (Low Dose CT Chest recommended if Age 46-80 years, 30 pack-year currently smoking OR have quit w/in 15years.) does not qualify.     Additional Screening:  Hepatitis C Screening: Completed 11/19/2016  Vision Screening: Recommended annual ophthalmology exams for early detection of glaucoma and other disorders of the eye. Is the patient up to date with their annual eye exam?  No  Who is the provider  or what is the name of the office in which the patient attends annual eye exams? Unsure of name Patient to make an appt  Dental Screening: Recommended annual dental exams for proper oral hygiene  Community Resource Referral / Chronic Care Management: CRR required this visit?  Yes  For food assistance  CCM required this visit?  No      Plan:     I have  personally reviewed and noted the following in the patient's chart:   . Medical and social history . Use of alcohol, tobacco or illicit drugs  . Current medications and supplements . Functional ability and status . Nutritional status . Physical activity . Advanced directives . List of other physicians . Hospitalizations, surgeries, and ER visits in previous 12 months . Vitals . Screenings to include cognitive, depression, and falls . Referrals and appointments  In addition, I have reviewed and discussed with patient certain preventive protocols, quality metrics, and best practice recommendations. A written personalized care plan for preventive services as well as general preventive health recommendations were provided to patient.   Due to this being a telephonic visit, the after visit summary with patients personalized plan was offered to patient via mail or my-chart. Patient declined at this time.   Marta Antu, LPN   [id number]  Nurse Health Advisor  Nurse Notes: None

## 2020-09-11 ENCOUNTER — Ambulatory Visit (INDEPENDENT_AMBULATORY_CARE_PROVIDER_SITE_OTHER): Payer: Medicare Other

## 2020-09-11 VITALS — Ht 69.0 in | Wt 162.0 lb

## 2020-09-11 DIAGNOSIS — Z0000 Encounter for general adult medical examination without abnormal findings: Secondary | ICD-10-CM

## 2020-09-11 NOTE — Patient Instructions (Signed)
[name] [name] , Thank you for taking time to complete your Medicare Wellness Visit. I appreciate your ongoing commitment to your health goals. Please review the following plan we discussed and let me know if I can assist you in the future.   Screening recommendations/referrals: Colonoscopy: Declined Recommended yearly ophthalmology/optometry visit for glaucoma screening and checkup Recommended yearly dental visit for hygiene and checkup  Vaccinations: Influenza vaccine: Due- May obtain vaccine at our office or your local pharmacy Pneumococcal vaccine: Completed vaccines Tdap vaccine: Up to date-Due-01/30/2027 Shingles vaccine: Discuss with pharmcy Covid-19: Completed vaccines-Dates unknown  Advanced directives: Declined information  Conditions/risks identified: See problem list  Next appointment: Follow up in one year for your annual wellness visit.   Preventive Care 69 Years and Older, Male Preventive care refers to lifestyle choices and visits with your health care provider that can promote health and wellness. What does preventive care include?  A yearly physical exam. This is also called an annual well check.  Dental exams once or twice a year.  Routine eye exams. Ask your health care provider how often you should have your eyes checked.  Personal lifestyle choices, including:  Daily care of your teeth and gums.  Regular physical activity.  Eating a healthy diet.  Avoiding tobacco and drug use.  Limiting alcohol use.  Practicing safe sex.  Taking low doses of aspirin every day.  Taking vitamin and mineral supplements as recommended by your health care provider. What happens during an annual well check? The services and screenings done by your health care provider during your annual well check will depend on your age, overall health, lifestyle risk factors, and family history of disease. Counseling  Your health care provider may ask you questions about your:  Alcohol  use.  Tobacco use.  Drug use.  Emotional well-being.  Home and relationship well-being.  Sexual activity.  Eating habits.  History of falls.  Memory and ability to understand (cognition).  Work and work Statistician. Screening  You may have the following tests or measurements:  Height, weight, and BMI.  Blood pressure.  Lipid and cholesterol levels. These may be checked every 5 years, or more frequently if you are over 85 years old.  Skin check.  Lung cancer screening. You may have this screening every year starting at age 71 if you have a 30-pack-year history of smoking and currently smoke or have quit within the past 15 years.  Fecal occult blood test (FOBT) of the stool. You may have this test every year starting at age 75.  Flexible sigmoidoscopy or colonoscopy. You may have a sigmoidoscopy every 5 years or a colonoscopy every 10 years starting at age 4.  Prostate cancer screening. Recommendations will vary depending on your family history and other risks.  Hepatitis C blood test.  Hepatitis B blood test.  Sexually transmitted disease (STD) testing.  Diabetes screening. This is done by checking your blood sugar (glucose) after you have not eaten for a while (fasting). You may have this done every 1-3 years.  Abdominal aortic aneurysm (AAA) screening. You may need this if you are a current or former smoker.  Osteoporosis. You may be screened starting at age 110 if you are at high risk. Talk with your health care provider about your test results, treatment options, and if necessary, the need for more tests. Vaccines  Your health care provider may recommend certain vaccines, such as:  Influenza vaccine. This is recommended every year.  Tetanus, diphtheria, and acellular pertussis (Tdap,  Td) vaccine. You may need a Td booster every 10 years.  Zoster vaccine. You may need this after age 61.  Pneumococcal 13-valent conjugate (PCV13) vaccine. One dose is  recommended after age 8.  Pneumococcal polysaccharide (PPSV23) vaccine. One dose is recommended after age 8. Talk to your health care provider about which screenings and vaccines you need and how often you need them. This information is not intended to replace advice given to you by your health care provider. Make sure you discuss any questions you have with your health care provider. Document Released: 10/26/2015 Document Revised: 06/18/2016 Document Reviewed: 07/31/2015 Elsevier Interactive Patient Education  2017 [place] Prevention in the Home Falls can cause injuries. They can happen to people of all ages. There are many things you can do to make your home safe and to help prevent falls. What can I do on the outside of my home?  Regularly fix the edges of walkways and driveways and fix any cracks.  Remove anything that might make you trip as you walk through a door, such as a raised step or threshold.  Trim any bushes or trees on the path to your home.  Use bright outdoor lighting.  Clear any walking paths of anything that might make someone trip, such as rocks or tools.  Regularly check to see if handrails are loose or broken. Make sure that both sides of any steps have handrails.  Any raised decks and porches should have guardrails on the edges.  Have any leaves, snow, or ice cleared regularly.  Use sand or salt on walking paths during winter.  Clean up any spills in your garage right away. This includes oil or grease spills. What can I do in the bathroom?  Use night lights.  Install grab bars by the toilet and in the tub and shower. Do not use towel bars as grab bars.  Use non-skid mats or decals in the tub or shower.  If you need to sit down in the shower, use a plastic, non-slip stool.  Keep the floor dry. Clean up any water that spills on the floor as soon as it happens.  Remove soap buildup in the tub or shower regularly.  Attach bath mats  securely with double-sided non-slip rug tape.  Do not have throw rugs and other things on the floor that can make you trip. What can I do in the bedroom?  Use night lights.  Make sure that you have a light by your bed that is easy to reach.  Do not use any sheets or blankets that are too big for your bed. They should not hang down onto the floor.  Have a firm chair that has side arms. You can use this for support while you get dressed.  Do not have throw rugs and other things on the floor that can make you trip. What can I do in the kitchen?  Clean up any spills right away.  Avoid walking on wet floors.  Keep items that you use a lot in easy-to-reach places.  If you need to reach something above you, use a strong step stool that has a grab bar.  Keep electrical cords out of the way.  Do not use floor polish or wax that makes floors slippery. If you must use wax, use non-skid floor wax.  Do not have throw rugs and other things on the floor that can make you trip. What can I do with my stairs?  Do not  leave any items on the stairs.  Make sure that there are handrails on both sides of the stairs and use them. Fix handrails that are broken or loose. Make sure that handrails are as long as the stairways.  Check any carpeting to make sure that it is firmly attached to the stairs. Fix any carpet that is loose or worn.  Avoid having throw rugs at the top or bottom of the stairs. If you do have throw rugs, attach them to the floor with carpet tape.  Make sure that you have a light switch at the top of the stairs and the bottom of the stairs. If you do not have them, ask someone to add them for you. What else can I do to help prevent falls?  Wear shoes that:  Do not have high heels.  Have rubber bottoms.  Are comfortable and fit you well.  Are closed at the toe. Do not wear sandals.  If you use a stepladder:  Make sure that it is fully opened. Do not climb a closed  stepladder.  Make sure that both sides of the stepladder are locked into place.  Ask someone to hold it for you, if possible.  Clearly mark and make sure that you can see:  Any grab bars or handrails.  First and last steps.  Where the edge of each step is.  Use tools that help you move around (mobility aids) if they are needed. These include:  Canes.  Walkers.  Scooters.  Crutches.  Turn on the lights when you go into a dark area. Replace any light bulbs as soon as they burn out.  Set up your furniture so you have a clear path. Avoid moving your furniture around.  If any of your floors are uneven, fix them.  If there are any pets around you, be aware of where they are.  Review your medicines with your doctor. Some medicines can make you feel dizzy. This can increase your chance of falling. Ask your doctor what other things that you can do to help prevent falls. This information is not intended to replace advice given to you by your health care provider. Make sure you discuss any questions you have with your health care provider. Document Released: 07/26/2009 Document Revised: 03/06/2016 Document Reviewed: 11/03/2014 Elsevier Interactive Patient Education  2017 [employer].

## 2020-09-12 ENCOUNTER — Telehealth: Payer: Self-pay | Admitting: Family Medicine

## 2020-09-12 NOTE — Telephone Encounter (Signed)
Claretta Fraise 09/12/2020 Called pt regarding community resource referral received. My info is [phone] please see ref notes for more details. Thank you.  [place], Care Management

## 2020-09-27 ENCOUNTER — Other Ambulatory Visit: Payer: Self-pay | Admitting: Family Medicine

## 2020-10-18 ENCOUNTER — Telehealth: Payer: Self-pay

## 2020-10-18 ENCOUNTER — Other Ambulatory Visit: Payer: Self-pay

## 2020-10-18 ENCOUNTER — Emergency Department (HOSPITAL_COMMUNITY): Payer: Medicare Other

## 2020-10-18 ENCOUNTER — Inpatient Hospital Stay (HOSPITAL_COMMUNITY)
Admission: EM | Admit: 2020-10-18 | Discharge: 2020-10-22 | DRG: 291 | Disposition: A | Discharge status: Hospice | Payer: Medicare Other | Attending: Internal Medicine | Admitting: Internal Medicine

## 2020-10-18 DIAGNOSIS — Z20822 Contact with and (suspected) exposure to covid-19: Secondary | ICD-10-CM | POA: Diagnosis present

## 2020-10-18 DIAGNOSIS — E1159 Type 2 diabetes mellitus with other circulatory complications: Secondary | ICD-10-CM

## 2020-10-18 DIAGNOSIS — Z79890 Hormone replacement therapy (postmenopausal): Secondary | ICD-10-CM

## 2020-10-18 DIAGNOSIS — R296 Repeated falls: Secondary | ICD-10-CM

## 2020-10-18 DIAGNOSIS — I152 Hypertension secondary to endocrine disorders: Secondary | ICD-10-CM | POA: Diagnosis present

## 2020-10-18 DIAGNOSIS — I132 Hypertensive heart and chronic kidney disease with heart failure and with stage 5 chronic kidney disease, or end stage renal disease: Secondary | ICD-10-CM | POA: Diagnosis not present

## 2020-10-18 DIAGNOSIS — J90 Pleural effusion, not elsewhere classified: Secondary | ICD-10-CM

## 2020-10-18 DIAGNOSIS — R197 Diarrhea, unspecified: Secondary | ICD-10-CM | POA: Diagnosis present

## 2020-10-18 DIAGNOSIS — E8779 Other fluid overload: Secondary | ICD-10-CM

## 2020-10-18 DIAGNOSIS — D631 Anemia in chronic kidney disease: Secondary | ICD-10-CM | POA: Diagnosis present

## 2020-10-18 DIAGNOSIS — F05 Delirium due to known physiological condition: Secondary | ICD-10-CM | POA: Diagnosis present

## 2020-10-18 DIAGNOSIS — Z794 Long term (current) use of insulin: Secondary | ICD-10-CM

## 2020-10-18 DIAGNOSIS — D638 Anemia in other chronic diseases classified elsewhere: Secondary | ICD-10-CM | POA: Diagnosis present

## 2020-10-18 DIAGNOSIS — E872 Acidosis: Secondary | ICD-10-CM | POA: Diagnosis present

## 2020-10-18 DIAGNOSIS — E1059 Type 1 diabetes mellitus with other circulatory complications: Secondary | ICD-10-CM

## 2020-10-18 DIAGNOSIS — N185 Chronic kidney disease, stage 5: Secondary | ICD-10-CM | POA: Diagnosis present

## 2020-10-18 DIAGNOSIS — E1122 Type 2 diabetes mellitus with diabetic chronic kidney disease: Secondary | ICD-10-CM | POA: Diagnosis present

## 2020-10-18 DIAGNOSIS — Z96641 Presence of right artificial hip joint: Secondary | ICD-10-CM | POA: Diagnosis present

## 2020-10-18 DIAGNOSIS — W19XXXA Unspecified fall, initial encounter: Secondary | ICD-10-CM | POA: Diagnosis present

## 2020-10-18 DIAGNOSIS — G9341 Metabolic encephalopathy: Secondary | ICD-10-CM | POA: Diagnosis not present

## 2020-10-18 DIAGNOSIS — R0602 Shortness of breath: Secondary | ICD-10-CM | POA: Diagnosis not present

## 2020-10-18 DIAGNOSIS — Z79899 Other long term (current) drug therapy: Secondary | ICD-10-CM

## 2020-10-18 DIAGNOSIS — Z8249 Family history of ischemic heart disease and other diseases of the circulatory system: Secondary | ICD-10-CM

## 2020-10-18 DIAGNOSIS — N189 Chronic kidney disease, unspecified: Secondary | ICD-10-CM

## 2020-10-18 DIAGNOSIS — N184 Chronic kidney disease, stage 4 (severe): Secondary | ICD-10-CM

## 2020-10-18 DIAGNOSIS — E8770 Fluid overload, unspecified: Secondary | ICD-10-CM | POA: Diagnosis present

## 2020-10-18 DIAGNOSIS — R109 Unspecified abdominal pain: Secondary | ICD-10-CM | POA: Diagnosis present

## 2020-10-18 DIAGNOSIS — N179 Acute kidney failure, unspecified: Secondary | ICD-10-CM | POA: Diagnosis not present

## 2020-10-18 DIAGNOSIS — E785 Hyperlipidemia, unspecified: Secondary | ICD-10-CM

## 2020-10-18 DIAGNOSIS — Z66 Do not resuscitate: Secondary | ICD-10-CM | POA: Diagnosis present

## 2020-10-18 DIAGNOSIS — R9431 Abnormal electrocardiogram [ECG] [EKG]: Secondary | ICD-10-CM | POA: Diagnosis present

## 2020-10-18 DIAGNOSIS — Z9181 History of falling: Secondary | ICD-10-CM

## 2020-10-18 DIAGNOSIS — M545 Low back pain, unspecified: Secondary | ICD-10-CM | POA: Diagnosis present

## 2020-10-18 DIAGNOSIS — Z515 Encounter for palliative care: Secondary | ICD-10-CM

## 2020-10-18 DIAGNOSIS — I5033 Acute on chronic diastolic (congestive) heart failure: Secondary | ICD-10-CM | POA: Diagnosis present

## 2020-10-18 DIAGNOSIS — E1065 Type 1 diabetes mellitus with hyperglycemia: Secondary | ICD-10-CM | POA: Diagnosis present

## 2020-10-18 DIAGNOSIS — E1169 Type 2 diabetes mellitus with other specified complication: Secondary | ICD-10-CM | POA: Diagnosis present

## 2020-10-18 DIAGNOSIS — R4781 Slurred speech: Secondary | ICD-10-CM | POA: Diagnosis present

## 2020-10-18 DIAGNOSIS — Z8673 Personal history of transient ischemic attack (TIA), and cerebral infarction without residual deficits: Secondary | ICD-10-CM

## 2020-10-18 DIAGNOSIS — F0390 Unspecified dementia without behavioral disturbance: Secondary | ICD-10-CM | POA: Diagnosis present

## 2020-10-18 DIAGNOSIS — F0391 Unspecified dementia with behavioral disturbance: Secondary | ICD-10-CM

## 2020-10-18 DIAGNOSIS — T17908A Unspecified foreign body in respiratory tract, part unspecified causing other injury, initial encounter: Secondary | ICD-10-CM | POA: Diagnosis present

## 2020-10-18 DIAGNOSIS — E039 Hypothyroidism, unspecified: Secondary | ICD-10-CM | POA: Diagnosis present

## 2020-10-18 DIAGNOSIS — S2241XA Multiple fractures of ribs, right side, initial encounter for closed fracture: Secondary | ICD-10-CM | POA: Diagnosis present

## 2020-10-18 LAB — MAGNESIUM: Magnesium: 2.1 mg/dL (ref 1.7–2.4)

## 2020-10-18 LAB — CBC
HCT: 26.4 % — ABNORMAL LOW (ref 39.0–52.0)
Hemoglobin: 8.2 g/dL — ABNORMAL LOW (ref 13.0–17.0)
MCH: 29.6 pg (ref 26.0–34.0)
MCHC: 31.1 g/dL (ref 30.0–36.0)
MCV: 95.3 fL (ref 80.0–100.0)
Platelets: 225 10*3/uL (ref 150–400)
RBC: 2.77 MIL/uL — ABNORMAL LOW (ref 4.22–5.81)
RDW: 15.8 % — ABNORMAL HIGH (ref 11.5–15.5)
WBC: 4.6 10*3/uL (ref 4.0–10.5)
nRBC: 0 % (ref 0.0–0.2)

## 2020-10-18 LAB — COMPREHENSIVE METABOLIC PANEL
ALT: 16 U/L (ref 0–44)
AST: 27 U/L (ref 15–41)
Albumin: 2.3 g/dL — ABNORMAL LOW (ref 3.5–5.0)
Alkaline Phosphatase: 72 U/L (ref 38–126)
Anion gap: 17 — ABNORMAL HIGH (ref 5–15)
BUN: 98 mg/dL — ABNORMAL HIGH (ref 8–23)
CO2: 21 mmol/L — ABNORMAL LOW (ref 22–32)
Calcium: 7 mg/dL — ABNORMAL LOW (ref 8.9–10.3)
Chloride: 104 mmol/L (ref 98–111)
Creatinine, Ser: 7.63 mg/dL — ABNORMAL HIGH (ref 0.61–1.24)
GFR, Estimated: 7 mL/min — ABNORMAL LOW (ref >60)
Glucose, Bld: 288 mg/dL — ABNORMAL HIGH (ref 70–99)
Potassium: 3.1 mmol/L — ABNORMAL LOW (ref 3.5–5.1)
Sodium: 142 mmol/L (ref 135–145)
Total Bilirubin: 0.9 mg/dL (ref 0.3–1.2)
Total Protein: 5.6 g/dL — ABNORMAL LOW (ref 6.5–8.1)

## 2020-10-18 LAB — RESP PANEL BY RT-PCR (FLU A&B, COVID) ARPGX2
Influenza A by PCR: NEGATIVE
Influenza B by PCR: NEGATIVE
SARS Coronavirus 2 by RT PCR: NEGATIVE

## 2020-10-18 LAB — BRAIN NATRIURETIC PEPTIDE: B Natriuretic Peptide: 1167.4 pg/mL — ABNORMAL HIGH (ref 0.0–100.0)

## 2020-10-18 LAB — GLUCOSE, CAPILLARY
Glucose-Capillary: 138 mg/dL — ABNORMAL HIGH (ref 70–99)
Glucose-Capillary: 148 mg/dL — ABNORMAL HIGH (ref 70–99)

## 2020-10-18 MED ORDER — POTASSIUM CHLORIDE 10 MEQ/100ML IV SOLN
10.0000 meq | INTRAVENOUS | Status: AC
  Administered 2020-10-19 (×2): 10 meq via INTRAVENOUS
  Filled 2020-10-18 (×2): qty 100

## 2020-10-18 MED ORDER — INSULIN ASPART 100 UNIT/ML ~~LOC~~ SOLN
0.0000 [IU] | SUBCUTANEOUS | Status: DC
  Administered 2020-10-19: 3 [IU] via SUBCUTANEOUS
  Administered 2020-10-19: 1 [IU] via SUBCUTANEOUS
  Administered 2020-10-19 – 2020-10-20 (×4): 2 [IU] via SUBCUTANEOUS
  Administered 2020-10-20: 1 [IU] via SUBCUTANEOUS
  Administered 2020-10-20 (×2): 2 [IU] via SUBCUTANEOUS
  Administered 2020-10-21: 1 [IU] via SUBCUTANEOUS
  Administered 2020-10-21: 2 [IU] via SUBCUTANEOUS
  Administered 2020-10-21 (×2): 1 [IU] via SUBCUTANEOUS
  Administered 2020-10-21: 3 [IU] via SUBCUTANEOUS
  Administered 2020-10-21 – 2020-10-22 (×3): 1 [IU] via SUBCUTANEOUS
  Administered 2020-10-22: 3 [IU] via SUBCUTANEOUS
  Administered 2020-10-22: 4 [IU] via SUBCUTANEOUS
  Administered 2020-10-22 (×2): 1 [IU] via SUBCUTANEOUS

## 2020-10-18 MED ORDER — FUROSEMIDE 10 MG/ML IJ SOLN
40.0000 mg | Freq: Once | INTRAMUSCULAR | Status: AC
  Administered 2020-10-18: 40 mg via INTRAVENOUS
  Filled 2020-10-18: qty 4

## 2020-10-18 MED ORDER — FUROSEMIDE 10 MG/ML IJ SOLN
80.0000 mg | Freq: Two times a day (BID) | INTRAMUSCULAR | Status: DC
  Administered 2020-10-19 – 2020-10-20 (×4): 80 mg via INTRAVENOUS
  Filled 2020-10-18 (×4): qty 8

## 2020-10-18 NOTE — Progress Notes (Signed)
[facility] ([facility] Community Based Palliative [facility]   °    °This patient is enrolled in our palliative care services in the community.  [facility] will continue to follow for any discharge planning needs and to coordinate continuation of palliative care.   °If you have questions or need assistance, please call [phone] or contact the hospital Liaison listed on [facility].    ° °Thank you for the opportunity to participate in this patient’s care. °    °Chrislyn King, BSN, RN °[facility] Hospital Liaison   °[phone] (24h on call) ° °

## 2020-10-18 NOTE — ED Notes (Signed)
Pt admitted to 1B14; report called to Webb Silversmith, RN

## 2020-10-18 NOTE — H&P (Incomplete)
[name] [name] IWL:[id number] DOB: 07/09/1946 DOA: 10/18/2020     PCP: Libby Maw, MD   Outpatient Specialists:    NEphrology:   Dr. Merita Norton   Patient arrived to ER on 10/18/20 at 1451 Referred by Attending Quintella Reichert, MD   Patient coming from: home Lives With family   Chief Complaint:   Chief Complaint  Patient presents with  . Abdominal Pain    HPI: [name] [name] is a 75 y.o. male with medical history significant of DM1, anemia of chronic disease, CKD stage V, brittle diabetes, history of CVA, dementia, aspiration pneumonia    Presented with abdominal pain in abdominal and umbilical area radiating to the legs started at breakfast associated diarrhea. He has had some worsening shortness of breath. He has not been eating well so today sister ordered him pizza. He refused to eat it so sister asked him if he want to go tot he hospital and he said yes.  No fevers or chills unsure if he has any cough. He has had falls and since then had some back pain. Per family he started to gurgle when he is breathing he continues to cough when he is eating. Family did confirm a fall but unsure if he hit his head. At his baseline he has slurred speech. Family reports at home blood sugars 200-300s.  And family gave him some insulin 5 units prior to arrival. Patient recently admitted in November for altered mental status He has been having recurrent aspiration During last admission refused to participate with SLP exam. Given that he will likely have recurrent aspiration he was transitioned to hospice Patient was made DO NOT RESUSCITATE DO NOT INTUBATE  He does have history of CKD has been followed up by outpatient nephrology who felt he is not a good dialysis candidate he is on bicarbonate and Lasix Plan was for patient to be seen by hospice as an outpatient family states that they were not seen by hospice.  They called today and left a VM.  Currently family states that they wishes for him  to be full code   Infectious risk factors:  Reports shortness of breath     Has  been vaccinated against COVID according to sister but unsure Probably not boosted   Initial COVID TEST  NEGATIVE   Lab Results  Component Value Date   [place] 10/18/2020   [place] NEGATIVE 08/19/2020   [place] NEGATIVE 08/14/2020   [place] NEGATIVE 06/10/2020    Regarding pertinent Chronic problems:    Hyperlipidemia - on statins Lipitor Lipid Panel     Component Value Date/Time   CHOL 185 03/25/2020 0213   CHOL 220 (H) 03/16/2019 1021   TRIG 61 03/25/2020 0213   HDL 76 03/25/2020 0213   HDL 61 03/16/2019 1021   CHOLHDL 2.4 03/25/2020 0213   VLDL 12 03/25/2020 0213   LDLCALC 97 03/25/2020 0213   LDLCALC 117 (H) 03/16/2019 1021   LABVLDL 42 (H) 03/16/2019 1021     HTN on Norvasc, hydralazine   chronic CHF  - last echo 08/15/2020 preserved EF    DM 1 -  Lab Results  Component Value Date   HGBA1C 10.7 (H) 08/15/2020   on insulin,    Hypothyroidism:  Lab Results  Component Value Date   TSH 9.376 (H) 08/18/2020   on synthroid     Hx of CVA -  With residual deficits on Aspirin 81 mg,     CKD stage V- baseline Cr 6.5  Estimated Creatinine Clearance: 8.4 mL/min (A) (by C-G formula based on SCr of 7.63 mg/dL (H)).  Lab Results  Component Value Date   CREATININE 7.63 (H) 10/18/2020   CREATININE 6.56 (H) 08/20/2020   CREATININE 6.43 (H) 08/19/2020     Dementia - not any meds    Chronic anemia - baseline hg Hemoglobin & Hematocrit  Recent Labs    08/19/20 1822 08/20/20 0203 10/18/20 1516  HGB 7.5* 8.2* 8.2*    While in ER: Noted to be hypothermic temperature 97.1 Blood pressure 170s over 95 satting 100% Noted pitting edema bilaterally up to the hips. Potassium down to 3.1 creatinine up from baseline of six to 7.6 Albumin 2.3  CT showed moderate to large right lateral pleural effusions with compressive atelectasis but no infiltrate Chronic  compression fractures and acute rib fractures. Evidence of anasarca  ER Provider Called:  Hospice     They Recommend admit to medicine   Will see in AM   Hospitalist was called for admission for fluid overeload  The following Work up has been ordered so far:  Orders Placed This Encounter  Procedures  . Resp Panel by RT-PCR (Flu A&B, Covid) Nasopharyngeal Swab  . DG Chest Portable 1 View  . CT Chest Wo Contrast  . CT Head Wo Contrast  . CT ABDOMEN PELVIS WO CONTRAST  . CT L-SPINE NO CHARGE  . CBC  . Comprehensive metabolic panel  . Brain natriuretic peptide  . Magnesium  . Maintain IV access  . Check Pulse Oximetry while ambulating  . Consult to hospitalist  ALL PATIENTS BEING ADMITTED/HAVING PROCEDURES NEED COVID-19 SCREENING  . POC CBG, ED  . EKG 12-Lead  . EKG 12-Lead  . ED EKG    Following Medications were ordered in ER: Medications  furosemide (LASIX) injection 40 mg (40 mg Intravenous Given 10/18/20 1621)        Consult Orders  (From admission, onward)         Start     Ordered   10/18/20 1928  Consult to hospitalist  ALL PATIENTS BEING ADMITTED/HAVING PROCEDURES NEED COVID-19 SCREENING Paged by Jerene Pitch  Once       Comments: ALL PATIENTS BEING ADMITTED/HAVING PROCEDURES NEED COVID-19 SCREENING  Provider:  (Not yet assigned)  Question Answer Comment  Place call to: [facility]   Reason for Consult Admit      10/18/20 1927          Significant initial  Findings: Abnormal Labs Reviewed  CBC - Abnormal; Notable for the following components:      Result Value   RBC 2.77 (*)    Hemoglobin 8.2 (*)    HCT 26.4 (*)    RDW 15.8 (*)    All other components within normal limits  COMPREHENSIVE METABOLIC PANEL - Abnormal; Notable for the following components:   Potassium 3.1 (*)    CO2 21 (*)    Glucose, Bld 288 (*)    BUN 98 (*)    Creatinine, Ser 7.63 (*)    Calcium 7.0 (*)    Total Protein 5.6 (*)    Albumin 2.3 (*)    GFR, Estimated 7 (*)     Anion gap 17 (*)    All other components within normal limits  BRAIN NATRIURETIC PEPTIDE - Abnormal; Notable for the following components:   B Natriuretic Peptide 1,167.4 (*)    All other components within normal limits   Otherwise labs showing:   Recent Labs  Lab 10/18/20 1516  NA 142  K 3.1*  CO2 21*  GLUCOSE 288*  BUN 98*  CREATININE 7.63*  CALCIUM 7.0*  MG 2.1   Cr   Up from baseline see below Lab Results  Component Value Date   CREATININE 7.63 (H) 10/18/2020   CREATININE 6.56 (H) 08/20/2020   CREATININE 6.43 (H) 08/19/2020    Recent Labs  Lab 10/18/20 1516  AST 27  ALT 16  ALKPHOS 72  BILITOT 0.9  PROT 5.6*  ALBUMIN 2.3*   Lab Results  Component Value Date   CALCIUM 7.0 (L) 10/18/2020   PHOS 4.4 04/24/2020    WBC      Component Value Date/Time   WBC 4.6 10/18/2020 1516   LYMPHSABS 1.3 08/18/2020 2330   LYMPHSABS 1.9 03/16/2019 1021   MONOABS 1.1 (H) 08/18/2020 2330   EOSABS 0.3 08/18/2020 2330   EOSABS 0.5 (H) 03/16/2019 1021   BASOSABS 0.1 08/18/2020 2330   BASOSABS 0.0 03/16/2019 1021    Plt: Lab Results  Component Value Date   PLT 225 10/18/2020    Lactic Acid, Venous    Component Value Date/Time   LATICACIDVEN 1.5 08/14/2020 2204    Procalcitonin *** Ordered   COVID-19 Labs  No results for input(s): DDIMER, FERRITIN, LDH, CRP in the last 72 hours.  Lab Results  Component Value Date   SARSCOV2NAA NEGATIVE 10/18/2020   SARSCOV2NAA NEGATIVE 08/19/2020   [place] NEGATIVE 08/14/2020   [place] NEGATIVE 06/10/2020    Arterial ***Venous  Blood Gas result:  pH *** pCO2 ***; pO2 ***;     %O2 Sat ***.  ABG    Component Value Date/Time   PHART 7.341 (L) 08/18/2020 2305   PCO2ART 44.6 08/18/2020 2305   PO2ART 46 (L) 08/18/2020 2305   HCO3 24.1 08/18/2020 2305   TCO2 25 08/18/2020 2305   ACIDBASEDEF 2.0 08/18/2020 2305   O2SAT 79.0 08/18/2020 2305      HG/HCT * stable,  Down *Up from baseline see below    Component  Value Date/Time   HGB 8.2 (L) 10/18/2020 1516   HGB 10.6 (L) 03/16/2019 1021   HCT 26.4 (L) 10/18/2020 1516   HCT 33.1 (L) 03/16/2019 1021   MCV 95.3 10/18/2020 1516   MCV 90 03/16/2019 1021    No results for input(s): LIPASE, AMYLASE in the last 168 hours. No results for input(s): AMMONIA in the last 168 hours.       ECG: Ordered Personally reviewed by me showing: HR : 75 Rhythm:  NSR, PACs   no evidence of ischemic changes QTC 595   BNP (last 3 results) Recent Labs    08/14/20 1713 08/18/20 2330 10/18/20 1519  BNP 178.6* 206.9* 1,167.4*     DM  labs:  HbA1C: Recent Labs    03/24/20 1221 07/26/20 0920 08/15/20 1213  HGBA1C 10.3* 10.0* 10.7*       CBG (last 3)  Recent Labs    10/18/20 2258  GLUCAP 138*       UA  ordered   Urine analysis:    Component Value Date/Time   COLORURINE YELLOW 08/15/2020 0830   APPEARANCEUR CLEAR 08/15/2020 0830   LABSPEC 1.014 08/15/2020 0830   PHURINE 5.0 08/15/2020 0830   GLUCOSEU >=500 (A) 08/15/2020 0830   GLUCOSEU >=1000 (A) 12/08/2019 1139   HGBUR NEGATIVE 08/15/2020 0830   BILIRUBINUR NEGATIVE 08/15/2020 0830   BILIRUBINUR Negative 11/09/2019 0910   KETONESUR 5 (A) 08/15/2020 0830   PROTEINUR >=300 (A) 08/15/2020 0830   UROBILINOGEN 0.2 12/08/2019  [place] 08/15/2020 0830   LEUKOCYTESUR NEGATIVE 08/15/2020 0830       Ordered  CT HEAD   NON acute  CXR -Ill-defined airspace opacity in the lung bases  CTabd/pelvis - No acute intra-abdominal or pelvic pathology  CTA chest - Moderate to large bilateral pleural effusions with adjacent compressive atelectasis Multiple healing right-sided rib fractures. anasarca   ED Triage Vitals  Enc Vitals Group     BP 10/18/20 1457 (!) 165/94     Pulse Rate 10/18/20 1457 75     Resp 10/18/20 1457 20     Temp 10/18/20 1457 (!) 97.4 F (36.3 C)     Temp Source 10/18/20 1457 Oral     SpO2 10/18/20 1457 100 %     Weight 10/18/20 1456 162 lb 0.6 oz (73.5  kg)     Height 10/18/20 1456 5' 9" (1.753 m)     Head Circumference --      Peak Flow --      Pain Score --      Pain Loc --      Pain Edu? --      Excl. in [place]? --   TMAX(24)@       Latest  Blood pressure (!) 163/97, pulse 74, temperature (!) 97.1 F (36.2 C), temperature source Oral, resp. rate 16, height 5' 9" (1.753 m), weight 73.5 kg, SpO2 100 %.    Review of Systems:    Pertinent positives include:  fatigue, Bilateral lower extremity swelling   Constitutional:  No weight loss, night sweats, Fevers, chills,  weight loss  HEENT:  No headaches, Difficulty swallowing,Tooth/dental problems,Sore throat,  No sneezing, itching, ear ache, nasal congestion, post nasal drip,  Cardio-vascular:  No chest pain, Orthopnea, PND, anasarca, dizziness, palpitations.no GI:  No heartburn, indigestion, abdominal pain, nausea, vomiting, diarrhea, change in bowel habits, loss of appetite, melena, blood in stool, hematemesis Resp:  no shortness of breath at rest. No dyspnea on exertion, No excess mucus, no productive cough, No non-productive cough, No coughing up of blood.No change in color of mucus.No wheezing. Skin:  no rash or lesions. No jaundice GU:  no dysuria, change in color of urine, no urgency or frequency. No straining to urinate.  No flank pain.  Musculoskeletal:  No joint pain or no joint swelling. No decreased range of motion. No back pain.  Psych:  No change in mood or affect. No depression or anxiety. No memory loss.  Neuro: no localizing neurological complaints, no tingling, no weakness, no double vision, no gait abnormality, no slurred speech, no confusion  All systems reviewed and apart from [place] all are negative  Past Medical History:   Past Medical History:  Diagnosis Date  . Cerebral infarction due to thrombosis of right posterior cerebral artery ([place]) 06/08/2015  . Closed comminuted intertrochanteric fracture of left femur ([place])   . Diabetes mellitus without  complication ([place])   . Diabetic hyperosmolar non-ketotic state ([place]) 05/15/2016  . DKA (diabetic ketoacidoses) 05/09/2016  . Hypertension   . Hypoglycemia 11/29/2019  . Postoperative anemia due to acute blood loss 06/18/2016  . Retroperitoneal hematoma 06/18/2016  . Stroke ([place])   . Vitamin B 12 deficiency 06/18/2016      Past Surgical History:  Procedure Laterality Date  . HIP ARTHROPLASTY Right 02/05/2013   Procedure: ARTHROPLASTY BIPOLAR HIP;  Surgeon: [name], MD;  Location: [facility];  Service: Orthopedics;  Laterality: Right;  . INTRAMEDULLARY (IM) NAIL INTERTROCHANTERIC Left 06/16/2016  Procedure: INTRAMEDULLARY (IM) NAIL INTERTROCHANTRIC;  Surgeon: Rod Can, MD;  Location: [facility];  Service: Orthopedics;  Laterality: Left;    Social History:  Ambulatory   Walker frequent falls     reports that he has never smoked. He has never used smokeless tobacco. He reports that he does not drink alcohol and does not use drugs.   Family History:   Family History  Problem Relation Age of Onset  . Alzheimer's disease Mother   . Hypertension Mother   . Hyperlipidemia Father   . Hypertension Father   . Healthy Maternal Grandmother   . Pneumonia Maternal Grandfather     Allergies: No Known Allergies   Prior to Admission medications   Medication Sig Start Date End Date Taking? Authorizing Provider  acetaminophen (TYLENOL) 325 MG tablet Take 2 tablets (650 mg total) by mouth every 6 (six) hours as needed for up to 30 doses for mild pain or moderate pain. 07/06/20  Yes Wyvonnia Dusky, MD  amLODipine (NORVASC) 10 MG tablet Take 1 tablet (10 mg total) by mouth daily. 06/26/20  Yes Libby Maw, MD  atorvastatin (LIPITOR) 40 MG tablet Take 1 tablet (40 mg total) by mouth daily. 06/26/20  Yes Libby Maw, MD  furosemide (LASIX) 40 MG tablet Take 40 mg by mouth 2 (two) times daily. 07/30/20  Yes [provider]  hydrALAZINE (APRESOLINE) 100 MG tablet TAKE 1  TABLET BY MOUTH 3 TIMES A DAY. 07/30/20  Yes Libby Maw, MD  Insulin Glargine [facility]) 100 UNIT/ML Inject 5 Units into the skin at bedtime. 08/20/20  Yes Vann, Jessica U, DO  insulin lispro (HUMALOG KWIKPEN) 100 UNIT/ML KwikPen Inject 0-10 Units into the skin 3 (three) times daily as needed (blood sugar). Max daily 30 units 08/20/20  Yes Vann, Jessica U, DO  levothyroxine (SYNTHROID) 25 MCG tablet TAKE 1 TABLET EACH DAY. Patient taking differently: Take 25 mcg by mouth daily. 09/27/20  Yes Dutch Quint B, [name]  sodium bicarbonate 650 MG tablet Take 1 tablet (650 mg total) by mouth 2 (two) times daily. 04/19/20  Yes [name], MD  vitamin B-12 1000 MCG tablet Take 1 tablet (1,000 mcg total) by mouth daily. 12/05/19  Yes [name], MD  ACCU-CHEK GUIDE test strip USE AS DIRECTED 06/12/20   Azzie Glatter, [name]  Blood Glucose Monitoring Suppl (ACCU-CHEK GUIDE) w/Device KIT 1 Device by Does not apply route QID. 11/02/19   Azzie Glatter, [name]  glucose blood (ACCU-CHEK GUIDE) test strip Use as instructed 11/03/18   Azzie Glatter, [name]  glucose blood (TRUE METRIX BLOOD GLUCOSE TEST) test strip CHECK BLOOD SUGAR UP TO 4 TIMES A DAY. Patient taking differently: 1 each by Other route See admin instructions. CHECK BLOOD SUGAR UP TO 2 TIMES A DAY. 05/25/18   Azzie Glatter, [name]  Insulin Pen Needle 32G X 4 MM MISC 1 Device by Does not apply route in the morning, at noon, in the evening, and at bedtime. 01/03/20   [name], [name], MD  Insulin Syringe-Needle U-100 (BD INSULIN SYRINGE U/F) 31G X 5/16" 0.3 ML MISC Inject 1 each as directed 2 (two) times daily. as directed 11/02/19   Azzie Glatter, [name]   Physical Exam: Vitals with BMI 10/18/2020 10/18/2020 10/18/2020  Height - - -  Weight - - -  BMI - - -  Systolic [phone]  Diastolic 97 86 95  Pulse 74 71 68  Some encounter information is confidential  and restricted. Go to Review Flowsheets activity to see all data.      1. General:  in No  Acute distress    Chronically ill -appearing 2. Psychological: Alert and  Oriented to self only 3. Head/ENT:   Moist  Mucous Membranes                          Head Non traumatic, neck supple                           Poor Dentition 4. SKIN: normal   Skin turgor,  Skin clean Dry and intact no rash 5. Heart: Regular rate and rhythm no  Murmur, no Rub or gallop 6. Lungs: mild wheezes or crackles   7. Abdomen: Soft,  non-tender, Non distended  bowel sounds present 8. Lower extremities: no clubbing, cyanosis, 1+ edema 9. Neurologically Grossly intact, moving all 4 extremities equally      All other LABS:     Recent Labs  Lab 10/18/20 1516  WBC 4.6  HGB 8.2*  HCT 26.4*  MCV 95.3  PLT 225     Recent Labs  Lab 10/18/20 1516  NA 142  K 3.1*  CL 104  CO2 21*  GLUCOSE 288*  BUN 98*  CREATININE 7.63*  CALCIUM 7.0*  MG 2.1     Recent Labs  Lab 10/18/20 1516  AST 27  ALT 16  ALKPHOS 72  BILITOT 0.9  PROT 5.6*  ALBUMIN 2.3*       Cultures:    Component Value Date/Time   SDES URINE, RANDOM 11/22/2019 1600   SPECREQUEST NONE 11/22/2019 1600   CULT  11/22/2019 1600    NO GROWTH Performed at [facility], [place] [address], [place], [place] [id number]    REPTSTATUS 11/23/2019 FINAL 11/22/2019 1600     Radiological Exams on Admission: CT ABDOMEN PELVIS WO CONTRAST  Result Date: 10/18/2020 CLINICAL DATA:  Abdominal pain.  Back pain. EXAM: CT CHEST, ABDOMEN AND PELVIS WITHOUT CONTRAST CT LUMBAR SPINE WITHOUT CONTRAST TECHNIQUE: Multidetector CT imaging of the chest, abdomen and pelvis was performed following the standard protocol without IV contrast. Multiplanar CT images of the lumbar spine were reconstructed from contemporary CT of the Chest, Abdomen, and Pelvis COMPARISON:  07/06/2020 FINDINGS: CT CHEST FINDINGS Cardiovascular: The heart size is mildly enlarged. There are advanced coronary artery calcifications. There are atherosclerotic  changes of the thoracic aorta without evidence for an aneurysm. The intracardiac blood pool is hypodense relative to the adjacent myocardium consistent with anemia. There is no large pericardial effusion. Mediastinum/Nodes: -- No mediastinal lymphadenopathy. -- No hilar lymphadenopathy. -- No axillary lymphadenopathy. -- No supraclavicular lymphadenopathy. -- Normal thyroid gland where visualized. -  Unremarkable esophagus. Lungs/Pleura: There are moderate to large bilateral pleural effusions, new since prior study. There is adjacent compressive atelectasis. There is no pneumothorax. The trachea is unremarkable. Musculoskeletal: Again identified are multilevel compression fractures of the thoracic spine. These fractures all appear to be essentially unchanged since the prior study in September. There are multiple healing right-sided rib fractures. There is a questionable nondisplaced fracture involving the posterior eleventh rib on the left. CT ABDOMEN PELVIS FINDINGS Hepatobiliary: The liver is normal. Normal gallbladder.There is no biliary ductal dilation. Pancreas: Normal contours without ductal dilatation. No peripancreatic fluid collection. Spleen: Unremarkable. Adrenals/Urinary Tract: --Adrenal glands: Unremarkable. --Right kidney/ureter: No hydronephrosis or radiopaque kidney stones. --Left kidney/ureter: No hydronephrosis or radiopaque  kidney stones. --Urinary bladder: Unremarkable. Stomach/Bowel: --Stomach/Duodenum: No hiatal hernia or other gastric abnormality. Normal duodenal course and caliber. --Small bowel: Unremarkable. --Colon: Unremarkable. --Appendix: Not visualized. No right lower quadrant inflammation or free fluid. Vascular/Lymphatic: Atherosclerotic calcification is present within the non-aneurysmal abdominal aorta, without hemodynamically significant stenosis. --No retroperitoneal lymphadenopathy. --No mesenteric lymphadenopathy. --No pelvic or inguinal lymphadenopathy. Reproductive:  Unremarkable Other: No ascites or free air. There is mild diffuse body wall edema. Musculoskeletal. An old L1 compression fractures again noted. There is advanced disc height loss at the L4-L5 and L5-S1 levels. There is no definite acute displaced fracture. The patient is status post prior total hip arthroplasty on the right. The patient is status post prior intramedullary nail placement on the left. IMPRESSION: 1. Moderate to large bilateral pleural effusions with adjacent compressive atelectasis, new since prior study. 2. No acute intra-abdominal or pelvic pathology. 3. Multiple healing right-sided rib fractures. Questionable nondisplaced fracture involving the posterior eleventh rib on the left. 4. Chronic compression fractures are noted throughout the thoracolumbar spine. No new compression fracture was identified on today's study. 5. Anasarca. 6. Anemia. Aortic Atherosclerosis (ICD10-I70.0). Electronically Signed   By: Constance Holster M.D.   On: 10/18/2020 18:27   CT Head Wo Contrast  Result Date: 10/18/2020 CLINICAL DATA:  75 year old male with head trauma. EXAM: CT HEAD WITHOUT CONTRAST TECHNIQUE: Contiguous axial images were obtained from the base of the skull through the vertex without intravenous contrast. COMPARISON:  08/18/2020 head CT FINDINGS: Brain: No evidence of acute infarction, hemorrhage, hydrocephalus, extra-axial collection or mass lesion/mass effect. Atrophy and chronic small-vessel white matter ischemic changes are again noted. Vascular: Heavy carotid and vertebral atherosclerotic calcifications are noted. Skull: Normal. Negative for fracture or focal lesion. Sinuses/Orbits: No acute finding. Other: None. IMPRESSION: 1. No evidence of acute intracranial abnormality. 2. Atrophy and chronic small-vessel white matter ischemic changes. Electronically Signed   By: Margarette Canada M.D.   On: 10/18/2020 18:19   CT Chest Wo Contrast  Result Date: 10/18/2020 CLINICAL DATA:  Abdominal pain.  Back  pain. EXAM: CT CHEST, ABDOMEN AND PELVIS WITHOUT CONTRAST CT LUMBAR SPINE WITHOUT CONTRAST TECHNIQUE: Multidetector CT imaging of the chest, abdomen and pelvis was performed following the standard protocol without IV contrast. Multiplanar CT images of the lumbar spine were reconstructed from contemporary CT of the Chest, Abdomen, and Pelvis COMPARISON:  07/06/2020 FINDINGS: CT CHEST FINDINGS Cardiovascular: The heart size is mildly enlarged. There are advanced coronary artery calcifications. There are atherosclerotic changes of the thoracic aorta without evidence for an aneurysm. The intracardiac blood pool is hypodense relative to the adjacent myocardium consistent with anemia. There is no large pericardial effusion. Mediastinum/Nodes: -- No mediastinal lymphadenopathy. -- No hilar lymphadenopathy. -- No axillary lymphadenopathy. -- No supraclavicular lymphadenopathy. -- Normal thyroid gland where visualized. -  Unremarkable esophagus. Lungs/Pleura: There are moderate to large bilateral pleural effusions, new since prior study. There is adjacent compressive atelectasis. There is no pneumothorax. The trachea is unremarkable. Musculoskeletal: Again identified are multilevel compression fractures of the thoracic spine. These fractures all appear to be essentially unchanged since the prior study in September. There are multiple healing right-sided rib fractures. There is a questionable nondisplaced fracture involving the posterior eleventh rib on the left. CT ABDOMEN PELVIS FINDINGS Hepatobiliary: The liver is normal. Normal gallbladder.There is no biliary ductal dilation. Pancreas: Normal contours without ductal dilatation. No peripancreatic fluid collection. Spleen: Unremarkable. Adrenals/Urinary Tract: --Adrenal glands: Unremarkable. --Right kidney/ureter: No hydronephrosis or radiopaque kidney stones. --Left kidney/ureter: No hydronephrosis or  radiopaque kidney stones. --Urinary bladder: Unremarkable.  Stomach/Bowel: --Stomach/Duodenum: No hiatal hernia or other gastric abnormality. Normal duodenal course and caliber. --Small bowel: Unremarkable. --Colon: Unremarkable. --Appendix: Not visualized. No right lower quadrant inflammation or free fluid. Vascular/Lymphatic: Atherosclerotic calcification is present within the non-aneurysmal abdominal aorta, without hemodynamically significant stenosis. --No retroperitoneal lymphadenopathy. --No mesenteric lymphadenopathy. --No pelvic or inguinal lymphadenopathy. Reproductive: Unremarkable Other: No ascites or free air. There is mild diffuse body wall edema. Musculoskeletal. An old L1 compression fractures again noted. There is advanced disc height loss at the L4-L5 and L5-S1 levels. There is no definite acute displaced fracture. The patient is status post prior total hip arthroplasty on the right. The patient is status post prior intramedullary nail placement on the left. IMPRESSION: 1. Moderate to large bilateral pleural effusions with adjacent compressive atelectasis, new since prior study. 2. No acute intra-abdominal or pelvic pathology. 3. Multiple healing right-sided rib fractures. Questionable nondisplaced fracture involving the posterior eleventh rib on the left. 4. Chronic compression fractures are noted throughout the thoracolumbar spine. No new compression fracture was identified on today's study. 5. Anasarca. 6. Anemia. Aortic Atherosclerosis (ICD10-I70.0). Electronically Signed   By: Constance Holster M.D.   On: 10/18/2020 18:27   CT L-SPINE NO CHARGE  Result Date: 10/18/2020 CLINICAL DATA:  Abdominal pain.  Back pain. EXAM: CT CHEST, ABDOMEN AND PELVIS WITHOUT CONTRAST CT LUMBAR SPINE WITHOUT CONTRAST TECHNIQUE: Multidetector CT imaging of the chest, abdomen and pelvis was performed following the standard protocol without IV contrast. Multiplanar CT images of the lumbar spine were reconstructed from contemporary CT of the Chest, Abdomen, and Pelvis  COMPARISON:  07/06/2020 FINDINGS: CT CHEST FINDINGS Cardiovascular: The heart size is mildly enlarged. There are advanced coronary artery calcifications. There are atherosclerotic changes of the thoracic aorta without evidence for an aneurysm. The intracardiac blood pool is hypodense relative to the adjacent myocardium consistent with anemia. There is no large pericardial effusion. Mediastinum/Nodes: -- No mediastinal lymphadenopathy. -- No hilar lymphadenopathy. -- No axillary lymphadenopathy. -- No supraclavicular lymphadenopathy. -- Normal thyroid gland where visualized. -  Unremarkable esophagus. Lungs/Pleura: There are moderate to large bilateral pleural effusions, new since prior study. There is adjacent compressive atelectasis. There is no pneumothorax. The trachea is unremarkable. Musculoskeletal: Again identified are multilevel compression fractures of the thoracic spine. These fractures all appear to be essentially unchanged since the prior study in September. There are multiple healing right-sided rib fractures. There is a questionable nondisplaced fracture involving the posterior eleventh rib on the left. CT ABDOMEN PELVIS FINDINGS Hepatobiliary: The liver is normal. Normal gallbladder.There is no biliary ductal dilation. Pancreas: Normal contours without ductal dilatation. No peripancreatic fluid collection. Spleen: Unremarkable. Adrenals/Urinary Tract: --Adrenal glands: Unremarkable. --Right kidney/ureter: No hydronephrosis or radiopaque kidney stones. --Left kidney/ureter: No hydronephrosis or radiopaque kidney stones. --Urinary bladder: Unremarkable. Stomach/Bowel: --Stomach/Duodenum: No hiatal hernia or other gastric abnormality. Normal duodenal course and caliber. --Small bowel: Unremarkable. --Colon: Unremarkable. --Appendix: Not visualized. No right lower quadrant inflammation or free fluid. Vascular/Lymphatic: Atherosclerotic calcification is present within the non-aneurysmal abdominal aorta,  without hemodynamically significant stenosis. --No retroperitoneal lymphadenopathy. --No mesenteric lymphadenopathy. --No pelvic or inguinal lymphadenopathy. Reproductive: Unremarkable Other: No ascites or free air. There is mild diffuse body wall edema. Musculoskeletal. An old L1 compression fractures again noted. There is advanced disc height loss at the L4-L5 and L5-S1 levels. There is no definite acute displaced fracture. The patient is status post prior total hip arthroplasty on the right. The patient is status post prior intramedullary nail placement  on the left. IMPRESSION: 1. Moderate to large bilateral pleural effusions with adjacent compressive atelectasis, new since prior study. 2. No acute intra-abdominal or pelvic pathology. 3. Multiple healing right-sided rib fractures. Questionable nondisplaced fracture involving the posterior eleventh rib on the left. 4. Chronic compression fractures are noted throughout the thoracolumbar spine. No new compression fracture was identified on today's study. 5. Anasarca. 6. Anemia. Aortic Atherosclerosis (ICD10-I70.0). Electronically Signed   By: Constance Holster M.D.   On: 10/18/2020 18:27   DG Chest Portable 1 View  Result Date: 10/18/2020 CLINICAL DATA:  Shortness of breath EXAM: PORTABLE CHEST 1 VIEW COMPARISON:  August 19, 2020 FINDINGS: There is cardiomegaly with pulmonary vascularity normal. There is ill-defined airspace opacity in the lower lung regions bilaterally. No consolidation. No appreciable adenopathy. No bone lesions. IMPRESSION: Ill-defined airspace opacity in the lung bases, slightly more notable on the left than on the right. Suspect bibasilar pneumonia. Question atypical organism pneumonia. Advise COVID-19 status check. Cardiomegaly with pulmonary vascular within normal limits. No adenopathy demonstrable. Electronically Signed   By: Lowella Grip III M.D.   On: 10/18/2020 15:48    Chart has been reviewed    Assessment/Plan   75  y.o. male with medical history significant of DM1, anemia of chronic disease, CKD stage V, brittle diabetes, history of CVA, dementia, aspiration pneumonia  Admitted for fluid overload  Present on Admission: . Fluid overload . Type 1 diabetes mellitus with hyperglycemia ([place]) . Hypothyroidism . Hyperlipidemia associated with type 2 diabetes mellitus ([place]) . Hypertension associated with diabetes ([place]) . Dementia ([place]) . CKD (chronic kidney disease) stage 4, GFR 15-29 ml/min ([name]) . Acute metabolic encephalopathy . Anemia of chronic disease . Prolonged QT interval . Aspiration, chronic pulmonary   Other plan as per orders.  DVT prophylaxis:  SCD    Code Status:    Code Status: Prior FULL CODE   as per  family I had personally discussed CODE STATUS with  Family      Family Communication:   Family not at  Bedside  plan of care was discussed on the phone with  Sister   Disposition Plan:    likely will need placement for rehabilitation                       Following barriers for discharge:                            Electrolytes corrected                               Anemia stable                                                        Will likely need home health,                                               Would benefit from PT/OT eval prior to DC  Ordered                   Swallow eval -  SLP ordered                   Diabetes care coordinator                   Transition of care consulted                   Nutrition    consulted                                    Palliative care    consulted                 Consults called:  none   Admission status:  ED Disposition    ED Disposition Condition [place] [place]: [facility] [[id number]]  Level of Care: Telemetry Medical [[id number]]  I expect the patient will be discharged within 24 hours: No (not a candidate for 5C-Observation unit)  Covid Evaluation: Confirmed COVID Negative  Diagnosis: Fluid  overload [[id number]]  Admitting Physician: Toy Baker [[id number]]  Attending Physician: Toy Baker [[id number]]       Obs      Level of care    tele  For  24H      Lab Results  Component Value Date   [place] 10/18/2020     Precautions: admitted as  Covid Negative   PPE: Used by the provider:   P100  eye Goggles,  Gloves       10/18/2020, 11:49 PM    [facility]     after 2 AM please page floor coverage PA If 7AM-7PM, please contact the day team taking care of the patient using [email or web address]   Patient was evaluated in the context of the global COVID-19 pandemic, which necessitated consideration that the patient might be at risk for infection with the SARS-CoV-2 virus that causes COVID-19. Institutional protocols and algorithms that pertain to the evaluation of patients at risk for COVID-19 are in a state of rapid change based on information released by regulatory bodies including the CDC and federal and state organizations. These policies and algorithms were followed during the patient's care.

## 2020-10-18 NOTE — ED Provider Notes (Signed)
[facility] EMERGENCY DEPARTMENT Provider Note   CSN: [id number] Arrival date & time: 10/18/20  1451    History Chief Complaint  Patient presents with  . Abdominal Pain   [name] [name] is a 75 y.o. male with past medical history significant for  CVA, type 1 diabetes, hypertension, CKD, dementia who presents for evaluation of abdominal pain.  Patient unable to specify location however does seem more tender to his left lower quadrant.  He is unsure if he has had any diarrhea.  No fever, chills.  He is unsure of any cough.  He is unsure if he is vaccinated for Covid.  Has had some lower back pain after fall on Monday.  Denies any paresthesias or weakness.  Urinating without difficulty.  Due to dementia most of history is provider by sister   Level 5 Caveat- Dementia  Collateral from sister [name]  Patient has been breathing more heavily. States he has had a "gurgling" with his breathing.  Has been coughing while he is eating.  He is vaccinated for Covid.  Had a falls on Monday.  Unsure if he hit his head.  He is at his baseline mentation.  He is chronically slurred speech.  Blood sugars at home have been running 200s-300s.  She gave him insulin, 5 units just prior to arrival.  According to prior hospitalization patient was supposed to go home with hospice care in November.   Sister states patient has not been seen by palliative or hospice outpatient.  She did call them today and they recommended coming to the emergency department.  Unfortunately has had worsening kidney function.  Nephrology does not feel patient is a candidate for dialysis.  They did state that they were going to try and set up blood transfusions outpatient due to anemia as cause of his global weakness. Sister does seem overwhelmed at home. Recent passing of another brother.  Recently increased his Lasix at home to 80  Patient is FULL CODE per sister    HPI    Past Medical History:  Diagnosis Date  .  Cerebral infarction due to thrombosis of right posterior cerebral artery ([place]) 06/08/2015  . Closed comminuted intertrochanteric fracture of left femur ([place])   . Diabetes mellitus without complication ([place])   . Diabetic hyperosmolar non-ketotic state ([place]) 05/15/2016  . DKA (diabetic ketoacidoses) 05/09/2016  . Hypertension   . Hypoglycemia 11/29/2019  . Postoperative anemia due to acute blood loss 06/18/2016  . Retroperitoneal hematoma 06/18/2016  . Stroke ([place])   . Vitamin B 12 deficiency 06/18/2016    Patient Active Problem List   Diagnosis Date Noted  . Fluid overload 10/18/2020  . Palliative care by specialist   . Acute encephalopathy 08/18/2020  . Aspiration pneumonia of right lower lobe ([place]) 08/18/2020  . AKI (acute kidney injury) ([place]) 08/15/2020  . Type 1 diabetes mellitus with hyperglycemia ([place]) 07/27/2020  . Type 1 diabetes mellitus with stage 4 chronic kidney disease ([place]) 07/27/2020  . Diabetes mellitus type I ([place]) 07/27/2020  . Thrombocytopenia ([place]) 04/23/2020  . Hypothyroidism 04/22/2020  . DKA (diabetic ketoacidoses) 04/14/2020  . PVD (peripheral vascular disease) ([place]) 02/02/2020  . Poor social situation 12/08/2019  . CKD (chronic kidney disease) stage 4, GFR 15-29 ml/min ([name]) 12/08/2019  . Cognitive decline 11/22/2019  . ETOH abuse 05/17/2019  . Chronic diastolic CHF (congestive heart failure) ([place]) 05/17/2019  . Uncontrolled type 2 diabetes mellitus with hyperglycemia, with long-term current use of insulin ([place]) 10/03/2018  . Underweight  11/18/2017  . Anemia 11/18/2017  . History of cerebrovascular accident (CVA) in adulthood 07/25/2017  . Depression with anxiety 07/25/2017  . Left ventricular diastolic dysfunction 86/57/8469  . Dementia ([place]) 07/25/2017  . Brittle diabetes mellitus ([place]) 06/28/2017  . Hyperlipidemia associated with type 2 diabetes mellitus ([place]) 06/27/2017  . Hypertension associated with diabetes ([place]) 06/27/2017  . Acute metabolic  encephalopathy 62/95/2841  . Weakness 11/16/2016  . CKD (chronic kidney disease), stage V ([place])   . Noncompliance with medication regimen 08/17/2016  . History of DVT (deep vein thrombosis) 07/06/2016  . Vitamin B 12 deficiency 06/18/2016  . Hyperglycemia 05/15/2016  . Hyponatremia 05/15/2016  . Anxiety 05/15/2016  . Narcotic dependency, continuous ([place]) 11/02/2015  . Uncontrolled type 2 diabetes mellitus with diabetic nephropathy, with long-term current use of insulin ([place]) 08/12/2015  . Anemia of chronic disease 02/02/2013    Past Surgical History:  Procedure Laterality Date  . HIP ARTHROPLASTY Right 02/05/2013   Procedure: ARTHROPLASTY BIPOLAR HIP;  Surgeon: [name], MD;  Location: [facility];  Service: Orthopedics;  Laterality: Right;  . INTRAMEDULLARY (IM) NAIL INTERTROCHANTERIC Left 06/16/2016   Procedure: INTRAMEDULLARY (IM) NAIL INTERTROCHANTRIC;  Surgeon: Rod Can, MD;  Location: [facility];  Service: Orthopedics;  Laterality: Left;       Family History  Problem Relation Age of Onset  . Alzheimer's disease Mother   . Hypertension Mother   . Hyperlipidemia Father   . Hypertension Father   . Healthy Maternal Grandmother   . Pneumonia Maternal Grandfather     Social History   Tobacco Use  . Smoking status: Never Smoker  . Smokeless tobacco: Never Used  Vaping Use  . Vaping Use: Never used  Substance Use Topics  . Alcohol use: No  . Drug use: No    Home Medications Prior to Admission medications   Medication Sig Start Date End Date Taking? Authorizing Provider  acetaminophen (TYLENOL) 325 MG tablet Take 2 tablets (650 mg total) by mouth every 6 (six) hours as needed for up to 30 doses for mild pain or moderate pain. 07/06/20  Yes Wyvonnia Dusky, MD  amLODipine (NORVASC) 10 MG tablet Take 1 tablet (10 mg total) by mouth daily. 06/26/20  Yes Libby Maw, MD  atorvastatin (LIPITOR) 40 MG tablet Take 1 tablet (40 mg total) by mouth daily. 06/26/20   Yes Libby Maw, MD  furosemide (LASIX) 40 MG tablet Take 40 mg by mouth 2 (two) times daily. 07/30/20  Yes [provider]  hydrALAZINE (APRESOLINE) 100 MG tablet TAKE 1 TABLET BY MOUTH 3 TIMES A DAY. 07/30/20  Yes Libby Maw, MD  Insulin Glargine [facility]) 100 UNIT/ML Inject 5 Units into the skin at bedtime. 08/20/20  Yes Vann, Jessica U, DO  insulin lispro (HUMALOG KWIKPEN) 100 UNIT/ML KwikPen Inject 0-10 Units into the skin 3 (three) times daily as needed (blood sugar). Max daily 30 units 08/20/20  Yes Vann, Jessica U, DO  levothyroxine (SYNTHROID) 25 MCG tablet TAKE 1 TABLET EACH DAY. Patient taking differently: Take 25 mcg by mouth daily. 09/27/20  Yes Dutch Quint B, [name]  sodium bicarbonate 650 MG tablet Take 1 tablet (650 mg total) by mouth 2 (two) times daily. 04/19/20  Yes [name], MD  vitamin B-12 1000 MCG tablet Take 1 tablet (1,000 mcg total) by mouth daily. 12/05/19  Yes [name], MD  ACCU-CHEK GUIDE test strip USE AS DIRECTED 06/12/20   Azzie Glatter, [name]  Blood Glucose Monitoring Suppl (  ACCU-CHEK GUIDE) w/Device KIT 1 Device by Does not apply route QID. 11/02/19   Azzie Glatter, [name]  glucose blood (ACCU-CHEK GUIDE) test strip Use as instructed 11/03/18   Azzie Glatter, [name]  glucose blood (TRUE METRIX BLOOD GLUCOSE TEST) test strip CHECK BLOOD SUGAR UP TO 4 TIMES A DAY. Patient taking differently: 1 each by Other route See admin instructions. CHECK BLOOD SUGAR UP TO 2 TIMES A DAY. 05/25/18   Azzie Glatter, [name]  Insulin Pen Needle 32G X 4 MM MISC 1 Device by Does not apply route in the morning, at noon, in the evening, and at bedtime. 01/03/20   [name], [name], MD  Insulin Syringe-Needle U-100 (BD INSULIN SYRINGE U/F) 31G X 5/16" 0.3 ML MISC Inject 1 each as directed 2 (two) times daily. as directed 11/02/19   Azzie Glatter, [name]    Allergies    Patient has no known allergies.  Review of Systems   Review  of Systems  Unable to perform ROS: Dementia  Constitutional: Negative.   HENT: Negative.   Respiratory: Positive for cough and choking.   Gastrointestinal: Positive for abdominal pain and diarrhea.  Genitourinary: Negative.   Musculoskeletal: Positive for back pain.  Neurological: Negative.   All other systems reviewed and are negative.   Physical Exam Updated Vital Signs BP (!) 163/97   Pulse 74   Temp (!) 97.1 F (36.2 C) (Oral)   Resp 16   Ht _0  (1.753 m)   Wt 73.5 kg   SpO2 100%   BMI 23.93 kg/m   Physical Exam Vitals and nursing note reviewed. Exam conducted with a chaperone present.  Constitutional:      General: He is not in acute distress.    Appearance: He is well-nourished. He is ill-appearing (Chronically ill appearing). He is not toxic-appearing or diaphoretic.  HENT:     Head: Normocephalic and atraumatic.     Mouth/Throat:     Mouth: Mucous membranes are moist.     Pharynx: Oropharynx is clear.  Eyes:     Pupils: Pupils are equal, round, and reactive to light.  Cardiovascular:     Rate and Rhythm: Normal rate and regular rhythm.     Heart sounds: Normal heart sounds.  Pulmonary:     Effort: Pulmonary effort is normal. No respiratory distress.     Comments: Course lung sounds. Mild tachypnea Chest:     Comments: Equal rise and fall to chest wall Abdominal:     General: Abdomen is protuberant. Bowel sounds are normal. There is no distension.     Palpations: Abdomen is soft.     Tenderness: There is abdominal tenderness in the suprapubic area and left lower quadrant. There is no right CVA tenderness, left CVA tenderness, guarding or rebound.     Comments: Old midline surgical scar. Soft. Tenderness at suprapubic and LLQ. No guarding or rebound  Genitourinary:    Comments: No rash or lesions Musculoskeletal:        General: Normal range of motion.     Cervical back: Normal range of motion and neck supple.     Comments: Moves all 4 extremities.  Compartments soft. No bony tenderness  Skin:    General: Skin is warm and dry.     Capillary Refill: Capillary refill takes 2 to 3 seconds.     Comments: Pitting edema to hips bilaterally and pitting edema to Bilateral upper extremities. No erythema, warmth. Scattered various stages of wounds to BL  lower legs  Neurological:     Mental Status: Mental status is at baseline.     Motor: Weakness present.     Comments: At baseline per sister, Slurred speech at baseline. 4/5 strength to BL upper and lower extremities Equal grip strength Smile symmetric  No facial droop  Psychiatric:        Mood and Affect: Mood and affect normal.    ED Results / Procedures / Treatments   Labs (all labs ordered are listed, but only abnormal results are displayed) Labs Reviewed  CBC - Abnormal; Notable for the following components:      Result Value   RBC 2.77 (*)    Hemoglobin 8.2 (*)    HCT 26.4 (*)    RDW 15.8 (*)    All other components within normal limits  COMPREHENSIVE METABOLIC PANEL - Abnormal; Notable for the following components:   Potassium 3.1 (*)    CO2 21 (*)    Glucose, Bld 288 (*)    BUN 98 (*)    Creatinine, Ser 7.63 (*)    Calcium 7.0 (*)    Total Protein 5.6 (*)    Albumin 2.3 (*)    GFR, Estimated 7 (*)    Anion gap 17 (*)    All other components within normal limits  BRAIN NATRIURETIC PEPTIDE - Abnormal; Notable for the following components:   B Natriuretic Peptide 1,167.4 (*)    All other components within normal limits  RESP PANEL BY RT-PCR (FLU A&B, COVID) ARPGX2  MAGNESIUM  PHOSPHORUS  CBG MONITORING, ED    EKG EKG Interpretation  Date/Time:  Thursday October 18 2020 14:58:11 EST Ventricular Rate:  75 PR Interval:    QRS Duration: 96 QT Interval:  532 QTC Calculation: 595 R Axis:     Text Interpretation: Sinus rhythm Ventricular premature complex Anterior infarct, old Nonspecific T abnormalities, lateral leads Prolonged QT interval Confirmed by Quintella Reichert [phone]) on 10/18/2020 4:06:42 PM   Radiology CT ABDOMEN PELVIS WO CONTRAST  Result Date: 10/18/2020 CLINICAL DATA:  Abdominal pain.  Back pain. EXAM: CT CHEST, ABDOMEN AND PELVIS WITHOUT CONTRAST CT LUMBAR SPINE WITHOUT CONTRAST TECHNIQUE: Multidetector CT imaging of the chest, abdomen and pelvis was performed following the standard protocol without IV contrast. Multiplanar CT images of the lumbar spine were reconstructed from contemporary CT of the Chest, Abdomen, and Pelvis COMPARISON:  07/06/2020 FINDINGS: CT CHEST FINDINGS Cardiovascular: The heart size is mildly enlarged. There are advanced coronary artery calcifications. There are atherosclerotic changes of the thoracic aorta without evidence for an aneurysm. The intracardiac blood pool is hypodense relative to the adjacent myocardium consistent with anemia. There is no large pericardial effusion. Mediastinum/Nodes: -- No mediastinal lymphadenopathy. -- No hilar lymphadenopathy. -- No axillary lymphadenopathy. -- No supraclavicular lymphadenopathy. -- Normal thyroid gland where visualized. -  Unremarkable esophagus. Lungs/Pleura: There are moderate to large bilateral pleural effusions, new since prior study. There is adjacent compressive atelectasis. There is no pneumothorax. The trachea is unremarkable. Musculoskeletal: Again identified are multilevel compression fractures of the thoracic spine. These fractures all appear to be essentially unchanged since the prior study in September. There are multiple healing right-sided rib fractures. There is a questionable nondisplaced fracture involving the posterior eleventh rib on the left. CT ABDOMEN PELVIS FINDINGS Hepatobiliary: The liver is normal. Normal gallbladder.There is no biliary ductal dilation. Pancreas: Normal contours without ductal dilatation. No peripancreatic fluid collection. Spleen: Unremarkable. Adrenals/Urinary Tract: --Adrenal glands: Unremarkable. --Right kidney/ureter: No  hydronephrosis or radiopaque kidney  stones. --Left kidney/ureter: No hydronephrosis or radiopaque kidney stones. --Urinary bladder: Unremarkable. Stomach/Bowel: --Stomach/Duodenum: No hiatal hernia or other gastric abnormality. Normal duodenal course and caliber. --Small bowel: Unremarkable. --Colon: Unremarkable. --Appendix: Not visualized. No right lower quadrant inflammation or free fluid. Vascular/Lymphatic: Atherosclerotic calcification is present within the non-aneurysmal abdominal aorta, without hemodynamically significant stenosis. --No retroperitoneal lymphadenopathy. --No mesenteric lymphadenopathy. --No pelvic or inguinal lymphadenopathy. Reproductive: Unremarkable Other: No ascites or free air. There is mild diffuse body wall edema. Musculoskeletal. An old L1 compression fractures again noted. There is advanced disc height loss at the L4-L5 and L5-S1 levels. There is no definite acute displaced fracture. The patient is status post prior total hip arthroplasty on the right. The patient is status post prior intramedullary nail placement on the left. IMPRESSION: 1. Moderate to large bilateral pleural effusions with adjacent compressive atelectasis, new since prior study. 2. No acute intra-abdominal or pelvic pathology. 3. Multiple healing right-sided rib fractures. Questionable nondisplaced fracture involving the posterior eleventh rib on the left. 4. Chronic compression fractures are noted throughout the thoracolumbar spine. No new compression fracture was identified on today's study. 5. Anasarca. 6. Anemia. Aortic Atherosclerosis (ICD10-I70.0). Electronically Signed   By: Constance Holster M.D.   On: 10/18/2020 18:27   CT Head Wo Contrast  Result Date: 10/18/2020 CLINICAL DATA:  75 year old male with head trauma. EXAM: CT HEAD WITHOUT CONTRAST TECHNIQUE: Contiguous axial images were obtained from the base of the skull through the vertex without intravenous contrast. COMPARISON:  08/18/2020 head CT  FINDINGS: Brain: No evidence of acute infarction, hemorrhage, hydrocephalus, extra-axial collection or mass lesion/mass effect. Atrophy and chronic small-vessel white matter ischemic changes are again noted. Vascular: Heavy carotid and vertebral atherosclerotic calcifications are noted. Skull: Normal. Negative for fracture or focal lesion. Sinuses/Orbits: No acute finding. Other: None. IMPRESSION: 1. No evidence of acute intracranial abnormality. 2. Atrophy and chronic small-vessel white matter ischemic changes. Electronically Signed   By: Margarette Canada M.D.   On: 10/18/2020 18:19   CT Chest Wo Contrast  Result Date: 10/18/2020 CLINICAL DATA:  Abdominal pain.  Back pain. EXAM: CT CHEST, ABDOMEN AND PELVIS WITHOUT CONTRAST CT LUMBAR SPINE WITHOUT CONTRAST TECHNIQUE: Multidetector CT imaging of the chest, abdomen and pelvis was performed following the standard protocol without IV contrast. Multiplanar CT images of the lumbar spine were reconstructed from contemporary CT of the Chest, Abdomen, and Pelvis COMPARISON:  07/06/2020 FINDINGS: CT CHEST FINDINGS Cardiovascular: The heart size is mildly enlarged. There are advanced coronary artery calcifications. There are atherosclerotic changes of the thoracic aorta without evidence for an aneurysm. The intracardiac blood pool is hypodense relative to the adjacent myocardium consistent with anemia. There is no large pericardial effusion. Mediastinum/Nodes: -- No mediastinal lymphadenopathy. -- No hilar lymphadenopathy. -- No axillary lymphadenopathy. -- No supraclavicular lymphadenopathy. -- Normal thyroid gland where visualized. -  Unremarkable esophagus. Lungs/Pleura: There are moderate to large bilateral pleural effusions, new since prior study. There is adjacent compressive atelectasis. There is no pneumothorax. The trachea is unremarkable. Musculoskeletal: Again identified are multilevel compression fractures of the thoracic spine. These fractures all appear to be  essentially unchanged since the prior study in September. There are multiple healing right-sided rib fractures. There is a questionable nondisplaced fracture involving the posterior eleventh rib on the left. CT ABDOMEN PELVIS FINDINGS Hepatobiliary: The liver is normal. Normal gallbladder.There is no biliary ductal dilation. Pancreas: Normal contours without ductal dilatation. No peripancreatic fluid collection. Spleen: Unremarkable. Adrenals/Urinary Tract: --Adrenal glands: Unremarkable. --Right kidney/ureter: No hydronephrosis or radiopaque  kidney stones. --Left kidney/ureter: No hydronephrosis or radiopaque kidney stones. --Urinary bladder: Unremarkable. Stomach/Bowel: --Stomach/Duodenum: No hiatal hernia or other gastric abnormality. Normal duodenal course and caliber. --Small bowel: Unremarkable. --Colon: Unremarkable. --Appendix: Not visualized. No right lower quadrant inflammation or free fluid. Vascular/Lymphatic: Atherosclerotic calcification is present within the non-aneurysmal abdominal aorta, without hemodynamically significant stenosis. --No retroperitoneal lymphadenopathy. --No mesenteric lymphadenopathy. --No pelvic or inguinal lymphadenopathy. Reproductive: Unremarkable Other: No ascites or free air. There is mild diffuse body wall edema. Musculoskeletal. An old L1 compression fractures again noted. There is advanced disc height loss at the L4-L5 and L5-S1 levels. There is no definite acute displaced fracture. The patient is status post prior total hip arthroplasty on the right. The patient is status post prior intramedullary nail placement on the left. IMPRESSION: 1. Moderate to large bilateral pleural effusions with adjacent compressive atelectasis, new since prior study. 2. No acute intra-abdominal or pelvic pathology. 3. Multiple healing right-sided rib fractures. Questionable nondisplaced fracture involving the posterior eleventh rib on the left. 4. Chronic compression fractures are noted  throughout the thoracolumbar spine. No new compression fracture was identified on today's study. 5. Anasarca. 6. Anemia. Aortic Atherosclerosis (ICD10-I70.0). Electronically Signed   By: Constance Holster M.D.   On: 10/18/2020 18:27   CT L-SPINE NO CHARGE  Result Date: 10/18/2020 CLINICAL DATA:  Abdominal pain.  Back pain. EXAM: CT CHEST, ABDOMEN AND PELVIS WITHOUT CONTRAST CT LUMBAR SPINE WITHOUT CONTRAST TECHNIQUE: Multidetector CT imaging of the chest, abdomen and pelvis was performed following the standard protocol without IV contrast. Multiplanar CT images of the lumbar spine were reconstructed from contemporary CT of the Chest, Abdomen, and Pelvis COMPARISON:  07/06/2020 FINDINGS: CT CHEST FINDINGS Cardiovascular: The heart size is mildly enlarged. There are advanced coronary artery calcifications. There are atherosclerotic changes of the thoracic aorta without evidence for an aneurysm. The intracardiac blood pool is hypodense relative to the adjacent myocardium consistent with anemia. There is no large pericardial effusion. Mediastinum/Nodes: -- No mediastinal lymphadenopathy. -- No hilar lymphadenopathy. -- No axillary lymphadenopathy. -- No supraclavicular lymphadenopathy. -- Normal thyroid gland where visualized. -  Unremarkable esophagus. Lungs/Pleura: There are moderate to large bilateral pleural effusions, new since prior study. There is adjacent compressive atelectasis. There is no pneumothorax. The trachea is unremarkable. Musculoskeletal: Again identified are multilevel compression fractures of the thoracic spine. These fractures all appear to be essentially unchanged since the prior study in September. There are multiple healing right-sided rib fractures. There is a questionable nondisplaced fracture involving the posterior eleventh rib on the left. CT ABDOMEN PELVIS FINDINGS Hepatobiliary: The liver is normal. Normal gallbladder.There is no biliary ductal dilation. Pancreas: Normal contours  without ductal dilatation. No peripancreatic fluid collection. Spleen: Unremarkable. Adrenals/Urinary Tract: --Adrenal glands: Unremarkable. --Right kidney/ureter: No hydronephrosis or radiopaque kidney stones. --Left kidney/ureter: No hydronephrosis or radiopaque kidney stones. --Urinary bladder: Unremarkable. Stomach/Bowel: --Stomach/Duodenum: No hiatal hernia or other gastric abnormality. Normal duodenal course and caliber. --Small bowel: Unremarkable. --Colon: Unremarkable. --Appendix: Not visualized. No right lower quadrant inflammation or free fluid. Vascular/Lymphatic: Atherosclerotic calcification is present within the non-aneurysmal abdominal aorta, without hemodynamically significant stenosis. --No retroperitoneal lymphadenopathy. --No mesenteric lymphadenopathy. --No pelvic or inguinal lymphadenopathy. Reproductive: Unremarkable Other: No ascites or free air. There is mild diffuse body wall edema. Musculoskeletal. An old L1 compression fractures again noted. There is advanced disc height loss at the L4-L5 and L5-S1 levels. There is no definite acute displaced fracture. The patient is status post prior total hip arthroplasty on the right. The patient is  status post prior intramedullary nail placement on the left. IMPRESSION: 1. Moderate to large bilateral pleural effusions with adjacent compressive atelectasis, new since prior study. 2. No acute intra-abdominal or pelvic pathology. 3. Multiple healing right-sided rib fractures. Questionable nondisplaced fracture involving the posterior eleventh rib on the left. 4. Chronic compression fractures are noted throughout the thoracolumbar spine. No new compression fracture was identified on today's study. 5. Anasarca. 6. Anemia. Aortic Atherosclerosis (ICD10-I70.0). Electronically Signed   By: Constance Holster M.D.   On: 10/18/2020 18:27   DG Chest Portable 1 View  Result Date: 10/18/2020 CLINICAL DATA:  Shortness of breath EXAM: PORTABLE CHEST 1 VIEW  COMPARISON:  August 19, 2020 FINDINGS: There is cardiomegaly with pulmonary vascularity normal. There is ill-defined airspace opacity in the lower lung regions bilaterally. No consolidation. No appreciable adenopathy. No bone lesions. IMPRESSION: Ill-defined airspace opacity in the lung bases, slightly more notable on the left than on the right. Suspect bibasilar pneumonia. Question atypical organism pneumonia. Advise COVID-19 status check. Cardiomegaly with pulmonary vascular within normal limits. No adenopathy demonstrable. Electronically Signed   By: Lowella Grip III M.D.   On: 10/18/2020 15:48    Procedures Procedures (including critical care time)  Medications Ordered in ED Medications  furosemide (LASIX) injection 40 mg (40 mg Intravenous Given 10/18/20 1621)    ED Course  I have reviewed the triage vital signs and the nursing notes.  Pertinent labs & imaging results that were available during my care of the patient were reviewed by me and considered in my medical decision making (see chart for details).  75 year old chronically ill presents for evaluation of multiple complaints. Multiple falls.  Generalized weakness.  Has been having cough, increase edema to his bilateral legs to his hips as well as his bilateral arms.  Rapid breathing.  Appears chronically ill however does not appear septic.  We will plan on labs, imaging and reassess  Labs and imaging personally reviewed and interpreted: CBC without leukocytosis, hemoglobin 8.2 at baseline Metabolic panel with mild hypokalemia at 3.1, glucose 288, creatinine 7.63 up from prior, GFR 7, anion gap 17 BNP one 167.4, up from prior 200 Covid negative Chest x-ray possible pneumonia versus atelectasis CT head without significant findings CT chest with large bilateral pleural effusions CT abdomen pelvis without any acute findings EKG without ischemia   CONSULT with Chrislyn with [facility] Pt is a patient under palliative  care however difficulty with getting in contact with family. They have not been to the house for assessment. Will plan on following along while inpatient.  Patient attempted to ambulate however became dyspneic and tachycardic however did not become hypoxic.  Question from his bilateral pleural effusions.  Unfortunately even with his worsening kidney function per last admission he does not meet criteria for dialysis.  Has been increasing his Lasix at home per sister [name] to 80 mg without relief of his shortness of breath and edema.  Feel patient would likely benefit for inpatient admission for further work-up and management.  Patient will need to be admitted for large bilateral pleural effusions, fluid overload, worsening renal failure with weakness.   CONSULT with Dr. Roel Cluck with [facility] who will evaluate patient for admission.  The patient appears reasonably stabilized for admission considering the current resources, flow, and capabilities available in the ED at this time, and I doubt any other [facility] requiring further screening and/or treatment in the ED prior to admission.  2050: Updated patient's Sister [name].    MDM  Rules/Calculators/A&P                          Eual Lindstrom was evaluated in Emergency Department on 10/18/2020 for the symptoms described in the history of present illness. He was evaluated in the context of the global COVID-19 pandemic, which necessitated consideration that the patient might be at risk for infection with the SARS-CoV-2 virus that causes COVID-19. Institutional protocols and algorithms that pertain to the evaluation of patients at risk for COVID-19 are in a state of rapid change based on information released by regulatory bodies including the CDC and federal and state organizations. These policies and algorithms were followed during the patient's care in the ED. Final Clinical Impression(s) / ED Diagnoses Final diagnoses:  Acute renal failure superimposed on chronic  kidney disease, unspecified CKD stage, unspecified acute renal failure type ([name])  Bilateral pleural effusion  SOB (shortness of breath)  Multiple falls  Hypervolemia, unspecified hypervolemia type    Rx / DC Orders ED Discharge Orders    None       Henderly, Britni A, PA-C 10/18/20 2055    Quintella Reichert, MD 10/19/20 1745

## 2020-10-18 NOTE — ED Notes (Signed)
[name] (pt sister) called to get update on pt. Please call [phone] when you get a chance

## 2020-10-18 NOTE — Telephone Encounter (Signed)
Received message to call patient's sister, [name], because patient is not doing well. Phone call placed to [place]. VM left with call back information.

## 2020-10-18 NOTE — ED Notes (Signed)
Pt unable to ambulate at this time; pulse ox trial delayed.

## 2020-10-18 NOTE — H&P (Addendum)
  [name] [name] MRN:[id number] DOB: 08/03/1946 DOA: 10/18/2020     PCP: Kremer, William Alfred, MD   Outpatient Specialists:    NEphrology:   Dr. Sing   Patient arrived to ER on 10/18/20 at 1451 Referred by Attending Rees, Elizabeth, MD   Patient coming from: home Lives With family   Chief Complaint:   Chief Complaint  Patient presents with  . Abdominal Pain    HPI: [name] [name] is a 74 y.o. male with medical history significant of DM1, anemia of chronic disease, CKD stage V, brittle diabetes, history of CVA, dementia, aspiration pneumonia    Presented with abdominal pain in abdominal and umbilical area radiating to the legs started at breakfast associated diarrhea. He has had some worsening shortness of breath. He has not been eating well so today sister ordered him pizza. He refused to eat it so sister asked him if he want to go tot he hospital and he said yes.  No fevers or chills unsure if he has any cough. He has had falls and since then had some back pain. Per family he started to gurgle when he is breathing he continues to cough when he is eating. Family did confirm a fall but unsure if he hit his head. At his baseline he has slurred speech. Family reports at home blood sugars 200-300s.  And family gave him some insulin 5 units prior to arrival. Patient recently admitted in November for altered mental status He has been having recurrent aspiration During last admission refused to participate with SLP exam. Given that he will likely have recurrent aspiration he was transitioned to hospice Patient was made DO NOT RESUSCITATE DO NOT INTUBATE  He does have history of CKD has been followed up by outpatient nephrology who felt he is not a good dialysis candidate he is on bicarbonate and Lasix Plan was for patient to be seen by hospice as an outpatient family states that they were not seen by hospice.  They called today and left a VM.  Currently family states that they wishes for him  to be full code   Infectious risk factors:  Reports shortness of breath     Has  been vaccinated against COVID according to sister but unsure Probably not boosted   Initial COVID TEST  NEGATIVE   Lab Results  Component Value Date   SARSCOV2NAA NEGATIVE 10/18/2020   SARSCOV2NAA NEGATIVE 08/19/2020   SARSCOV2NAA NEGATIVE 08/14/2020   SARSCOV2NAA NEGATIVE 06/10/2020    Regarding pertinent Chronic problems:    Hyperlipidemia - on statins Lipitor Lipid Panel     Component Value Date/Time   CHOL 185 03/25/2020 0213   CHOL 220 (H) 03/16/2019 1021   TRIG 61 03/25/2020 0213   HDL 76 03/25/2020 0213   HDL 61 03/16/2019 1021   CHOLHDL 2.4 03/25/2020 0213   VLDL 12 03/25/2020 0213   LDLCALC 97 03/25/2020 0213   LDLCALC 117 (H) 03/16/2019 1021   LABVLDL 42 (H) 03/16/2019 1021     HTN on Norvasc, hydralazine   chronic CHF  - last echo 08/15/2020 preserved EF    DM 1 -  Lab Results  Component Value Date   HGBA1C 10.7 (H) 08/15/2020   on insulin,    Hypothyroidism:  Lab Results  Component Value Date   TSH 9.376 (H) 08/18/2020   on synthroid     Hx of CVA -  With residual deficits on Aspirin 81 mg,     CKD stage V- baseline Cr 6.5   Estimated Creatinine Clearance: 8.4 mL/min (A) (by C-G formula based on SCr of 7.63 mg/dL (H)).  Lab Results  Component Value Date   CREATININE 7.63 (H) 10/18/2020   CREATININE 6.56 (H) 08/20/2020   CREATININE 6.43 (H) 08/19/2020     Dementia - not any meds    Chronic anemia - baseline hg Hemoglobin & Hematocrit  Recent Labs    08/19/20 1822 08/20/20 0203 10/18/20 1516  HGB 7.5* 8.2* 8.2*    While in ER: Noted to be hypothermic temperature 97.1 Blood pressure 170s over 95 satting 100% Noted pitting edema bilaterally up to the hips. Potassium down to 3.1 creatinine up from baseline of six to 7.6 Albumin 2.3  CT showed moderate to large right lateral pleural effusions with compressive atelectasis but no infiltrate Chronic  compression fractures and acute rib fractures. Evidence of anasarca  ER Provider Called:  Hospice     They Recommend admit to medicine   Will see in AM   Hospitalist was called for admission for fluid overeload  The following Work up has been ordered so far:  Orders Placed This Encounter  Procedures  . Resp Panel by RT-PCR (Flu A&B, Covid) Nasopharyngeal Swab  . DG Chest Portable 1 View  . CT Chest Wo Contrast  . CT Head Wo Contrast  . CT ABDOMEN PELVIS WO CONTRAST  . CT L-SPINE NO CHARGE  . CBC  . Comprehensive metabolic panel  . Brain natriuretic peptide  . Magnesium  . Maintain IV access  . Check Pulse Oximetry while ambulating  . Consult to hospitalist  ALL PATIENTS BEING ADMITTED/HAVING PROCEDURES NEED COVID-19 SCREENING  . POC CBG, ED  . EKG 12-Lead  . EKG 12-Lead  . ED EKG    Following Medications were ordered in ER: Medications  furosemide (LASIX) injection 40 mg (40 mg Intravenous Given 10/18/20 1621)        Consult Orders  (From admission, onward)         Start     Ordered   10/18/20 1928  Consult to hospitalist  ALL PATIENTS BEING ADMITTED/HAVING PROCEDURES NEED COVID-19 SCREENING Paged by Jerene Pitch  Once       Comments: ALL PATIENTS BEING ADMITTED/HAVING PROCEDURES NEED COVID-19 SCREENING  Provider:  (Not yet assigned)  Question Answer Comment  Place call to: [facility]   Reason for Consult Admit      10/18/20 1927          Significant initial  Findings: Abnormal Labs Reviewed  CBC - Abnormal; Notable for the following components:      Result Value   RBC 2.77 (*)    Hemoglobin 8.2 (*)    HCT 26.4 (*)    RDW 15.8 (*)    All other components within normal limits  COMPREHENSIVE METABOLIC PANEL - Abnormal; Notable for the following components:   Potassium 3.1 (*)    CO2 21 (*)    Glucose, Bld 288 (*)    BUN 98 (*)    Creatinine, Ser 7.63 (*)    Calcium 7.0 (*)    Total Protein 5.6 (*)    Albumin 2.3 (*)    GFR, Estimated 7 (*)     Anion gap 17 (*)    All other components within normal limits  BRAIN NATRIURETIC PEPTIDE - Abnormal; Notable for the following components:   B Natriuretic Peptide 1,167.4 (*)    All other components within normal limits   Otherwise labs showing:   Recent Labs  Lab 10/18/20 1516  NA 142  K 3.1*  CO2 21*  GLUCOSE 288*  BUN 98*  CREATININE 7.63*  CALCIUM 7.0*  MG 2.1   Cr   Up from baseline see below Lab Results  Component Value Date   CREATININE 7.63 (H) 10/18/2020   CREATININE 6.56 (H) 08/20/2020   CREATININE 6.43 (H) 08/19/2020    Recent Labs  Lab 10/18/20 1516  AST 27  ALT 16  ALKPHOS 72  BILITOT 0.9  PROT 5.6*  ALBUMIN 2.3*   Lab Results  Component Value Date   CALCIUM 7.0 (L) 10/18/2020   PHOS 4.4 04/24/2020    WBC      Component Value Date/Time   WBC 4.6 10/18/2020 1516   LYMPHSABS 1.3 08/18/2020 2330   LYMPHSABS 1.9 03/16/2019 1021   MONOABS 1.1 (H) 08/18/2020 2330   EOSABS 0.3 08/18/2020 2330   EOSABS 0.5 (H) 03/16/2019 1021   BASOSABS 0.1 08/18/2020 2330   BASOSABS 0.0 03/16/2019 1021    Plt: Lab Results  Component Value Date   PLT 225 10/18/2020       COVID-19 Labs  No results for input(s): DDIMER, FERRITIN, LDH, CRP in the last 72 hours.  Lab Results  Component Value Date   SARSCOV2NAA NEGATIVE 10/18/2020   SARSCOV2NAA NEGATIVE 08/19/2020   SARSCOV2NAA NEGATIVE 08/14/2020   SARSCOV2NAA NEGATIVE 06/10/2020     HG/HCT  Stable,     Component Value Date/Time   HGB 8.2 (L) 10/18/2020 1516   HGB 10.6 (L) 03/16/2019 1021   HCT 26.4 (L) 10/18/2020 1516   HCT 33.1 (L) 03/16/2019 1021   MCV 95.3 10/18/2020 1516   MCV 90 03/16/2019 1021    No results for input(s): LIPASE, AMYLASE in the last 168 hours. No results for input(s): AMMONIA in the last 168 hours.       ECG: Ordered Personally reviewed by me showing: HR : 75 Rhythm:  NSR, PACs   no evidence of ischemic changes QTC 595   BNP (last 3 results) Recent Labs     08/14/20 1713 08/18/20 2330 10/18/20 1519  BNP 178.6* 206.9* 1,167.4*     DM  labs:  HbA1C: Recent Labs    03/24/20 1221 07/26/20 0920 08/15/20 1213  HGBA1C 10.3* 10.0* 10.7*       CBG (last 3)  Recent Labs    10/18/20 2258  GLUCAP 138*       UA  ordered   Urine analysis:    Component Value Date/Time   COLORURINE YELLOW 08/15/2020 0830   APPEARANCEUR CLEAR 08/15/2020 0830   LABSPEC 1.014 08/15/2020 0830   PHURINE 5.0 08/15/2020 0830   GLUCOSEU >=500 (A) 08/15/2020 0830   GLUCOSEU >=1000 (A) 12/08/2019 1139   HGBUR NEGATIVE 08/15/2020 0830   BILIRUBINUR NEGATIVE 08/15/2020 0830   BILIRUBINUR Negative 11/09/2019 0910   KETONESUR 5 (A) 08/15/2020 0830   PROTEINUR >=300 (A) 08/15/2020 0830   UROBILINOGEN 0.2 12/08/2019 1139   NITRITE NEGATIVE 08/15/2020 0830   LEUKOCYTESUR NEGATIVE 08/15/2020 0830       Ordered  CT HEAD   NON acute  CXR -Ill-defined airspace opacity in the lung bases  CTabd/pelvis - No acute intra-abdominal or pelvic pathology  CTA chest - Moderate to large bilateral pleural effusions with adjacent compressive atelectasis Multiple healing right-sided rib fractures. anasarca   ED Triage Vitals  Enc Vitals Group     BP 10/18/20 1457 (!) 165/94     Pulse Rate 10/18/20 1457 75     Resp 10/18/20 1457 20       Temp 10/18/20 1457 (!) 97.4 F (36.3 C)     Temp Source 10/18/20 1457 Oral     SpO2 10/18/20 1457 100 %     Weight 10/18/20 1456 162 lb 0.6 oz (73.5 kg)     Height 10/18/20 1456 5' 9" (1.753 m)     Head Circumference --      Peak Flow --      Pain Score --      Pain Loc --      Pain Edu? --      Excl. in [place]? --   TMAX(24)@       Latest  Blood pressure (!) 163/97, pulse 74, temperature (!) 97.1 F (36.2 C), temperature source Oral, resp. rate 16, height 5' 9" (1.753 m), weight 73.5 kg, SpO2 100 %.    Review of Systems:    Pertinent positives include:  fatigue, Bilateral lower extremity swelling   Constitutional:  No weight  loss, night sweats, Fevers, chills,  weight loss  HEENT:  No headaches, Difficulty swallowing,Tooth/dental problems,Sore throat,  No sneezing, itching, ear ache, nasal congestion, post nasal drip,  Cardio-vascular:  No chest pain, Orthopnea, PND, anasarca, dizziness, palpitations.no GI:  No heartburn, indigestion, abdominal pain, nausea, vomiting, diarrhea, change in bowel habits, loss of appetite, melena, blood in stool, hematemesis Resp:  no shortness of breath at rest. No dyspnea on exertion, No excess mucus, no productive cough, No non-productive cough, No coughing up of blood.No change in color of mucus.No wheezing. Skin:  no rash or lesions. No jaundice GU:  no dysuria, change in color of urine, no urgency or frequency. No straining to urinate.  No flank pain.  Musculoskeletal:  No joint pain or no joint swelling. No decreased range of motion. No back pain.  Psych:  No change in mood or affect. No depression or anxiety. No memory loss.  Neuro: no localizing neurological complaints, no tingling, no weakness, no double vision, no gait abnormality, no slurred speech, no confusion  All systems reviewed and apart from [place] all are negative  Past Medical History:   Past Medical History:  Diagnosis Date  . 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 Closed comminuted intertrochanteric fracture of left femur ([place])   . Diabetes mellitus without complication ([place])   . Diabetic hyperosmolar non-ketotic state ([place]) 05/15/2016  . DKA (diabetic ketoacidoses) 05/09/2016  . Hypertension   . Hypoglycemia 11/29/2019  . Postoperative anemia due to acute blood loss 06/18/2016  . Retroperitoneal hematoma 06/18/2016  . Stroke ([place])   . Vitamin B 12 deficiency 06/18/2016      Past Surgical History:  Procedure Laterality Date  . HIP ARTHROPLASTY Right 02/05/2013   Procedure: ARTHROPLASTY BIPOLAR HIP;  Surgeon: [name], MD;  Location: [facility];   Service: Orthopedics;  Laterality: Right;  . INTRAMEDULLARY (IM) NAIL INTERTROCHANTERIC Left 06/16/2016   Procedure: INTRAMEDULLARY (IM) NAIL INTERTROCHANTRIC;  Surgeon: Rod Can, MD;  Location: [facility];  Service: Orthopedics;  Laterality: Left;    Social History:  Ambulatory   Walker frequent falls     reports that he has never smoked. He has never used smokeless tobacco. He reports that he does not drink alcohol and does not use drugs.   Family History:   Family History  Problem Relation Age of Onset  . Alzheimer's disease Mother   . Hypertension Mother   . Hyperlipidemia Father   . Hypertension Father   . Healthy Maternal Grandmother   .  Pneumonia Maternal Grandfather     Allergies: No Known Allergies   Prior to Admission medications   Medication Sig Start Date End Date Taking? Authorizing Provider  acetaminophen (TYLENOL) 325 MG tablet Take 2 tablets (650 mg total) by mouth every 6 (six) hours as needed for up to 30 doses for mild pain or moderate pain. 07/06/20  Yes Trifan, Matthew J, MD  amLODipine (NORVASC) 10 MG tablet Take 1 tablet (10 mg total) by mouth daily. 06/26/20  Yes Kremer, William Alfred, MD  atorvastatin (LIPITOR) 40 MG tablet Take 1 tablet (40 mg total) by mouth daily. 06/26/20  Yes Kremer, William Alfred, MD  furosemide (LASIX) 40 MG tablet Take 40 mg by mouth 2 (two) times daily. 07/30/20  Yes [provider]  hydrALAZINE (APRESOLINE) 100 MG tablet TAKE 1 TABLET BY MOUTH 3 TIMES A DAY. 07/30/20  Yes Kremer, William Alfred, MD  Insulin Glargine (BASAGLAR KWIKPEN) 100 UNIT/ML Inject 5 Units into the skin at bedtime. 08/20/20  Yes Vann, Jessica U, DO  insulin lispro (HUMALOG KWIKPEN) 100 UNIT/ML KwikPen Inject 0-10 Units into the skin 3 (three) times daily as needed (blood sugar). Max daily 30 units 08/20/20  Yes Vann, Jessica U, DO  levothyroxine (SYNTHROID) 25 MCG tablet TAKE 1 TABLET EACH DAY. Patient taking differently: Take 25 mcg by mouth daily.  09/27/20  Yes Webb, Padonda B, [name]  sodium bicarbonate 650 MG tablet Take 1 tablet (650 mg total) by mouth 2 (two) times daily. 04/19/20  Yes Girguis, David, MD  vitamin B-12 1000 MCG tablet Take 1 tablet (1,000 mcg total) by mouth daily. 12/05/19  Yes Patel, Janki Y, MD  ACCU-CHEK GUIDE test strip USE AS DIRECTED 06/12/20   Stroud, Natalie M, [name]  Blood Glucose Monitoring Suppl (ACCU-CHEK GUIDE) w/Device KIT 1 Device by Does not apply route QID. 11/02/19   Stroud, Natalie M, [name]  glucose blood (ACCU-CHEK GUIDE) test strip Use as instructed 11/03/18   Stroud, Natalie M, [name]  glucose blood (TRUE METRIX BLOOD GLUCOSE TEST) test strip CHECK BLOOD SUGAR UP TO 4 TIMES A DAY. Patient taking differently: 1 each by Other route See admin instructions. CHECK BLOOD SUGAR UP TO 2 TIMES A DAY. 05/25/18   Stroud, Natalie M, [name]  Insulin Pen Needle 32G X 4 MM MISC 1 Device by Does not apply route in the morning, at noon, in the evening, and at bedtime. 01/03/20   [name], Ibtehal Jaralla, MD  Insulin Syringe-Needle U-100 (BD INSULIN SYRINGE U/F) 31G X 5/16" 0.3 ML MISC Inject 1 each as directed 2 (two) times daily. as directed 11/02/19   Stroud, Natalie M, [name]   Physical Exam: Vitals with BMI 10/18/2020 10/18/2020 10/18/2020  Height - - -  Weight - - -  BMI - - -  Systolic 163 158 170  Diastolic 97 86 95  Pulse 74 71 68  Some encounter information is confidential and restricted. Go to Review Flowsheets activity to see all data.     1. General:  in No  Acute distress    Chronically ill -appearing 2. Psychological: Alert and  Oriented to self only 3. Head/ENT:   Moist  Mucous Membranes                          Head Non traumatic, neck supple                           Poor Dentition   4. SKIN: normal   Skin turgor,  Skin clean Dry and intact no rash 5. Heart: Regular rate and rhythm no  Murmur, no Rub or gallop 6. Lungs: mild wheezes or crackles   7. Abdomen: Soft,  non-tender, Non distended  bowel sounds present 8.  Lower extremities: no clubbing, cyanosis, 1+ edema 9. Neurologically Grossly intact, moving all 4 extremities equally      All other LABS:     Recent Labs  Lab 10/18/20 1516  WBC 4.6  HGB 8.2*  HCT 26.4*  MCV 95.3  PLT 225     Recent Labs  Lab 10/18/20 1516  NA 142  K 3.1*  CL 104  CO2 21*  GLUCOSE 288*  BUN 98*  CREATININE 7.63*  CALCIUM 7.0*  MG 2.1     Recent Labs  Lab 10/18/20 1516  AST 27  ALT 16  ALKPHOS 72  BILITOT 0.9  PROT 5.6*  ALBUMIN 2.3*       Cultures:    Component Value Date/Time   SDES URINE, RANDOM 11/22/2019 1600   SPECREQUEST NONE 11/22/2019 1600   CULT  11/22/2019 1600    NO GROWTH Performed at [facility], [address] [place], [place] [address]    REPTSTATUS 11/23/2019 FINAL 11/22/2019 1600     Radiological Exams on Admission: CT ABDOMEN PELVIS WO CONTRAST  Result Date: 10/18/2020 CLINICAL DATA:  Abdominal pain.  Back pain. EXAM: CT CHEST, ABDOMEN AND PELVIS WITHOUT CONTRAST CT LUMBAR SPINE WITHOUT CONTRAST TECHNIQUE: Multidetector CT imaging of the chest, abdomen and pelvis was performed following the standard protocol without IV contrast. Multiplanar CT images of the lumbar spine were reconstructed from contemporary CT of the Chest, Abdomen, and Pelvis COMPARISON:  07/06/2020 FINDINGS: CT CHEST FINDINGS Cardiovascular: The heart size is mildly enlarged. There are advanced coronary artery calcifications. There are atherosclerotic changes of the thoracic aorta without evidence for an aneurysm. The intracardiac blood pool is hypodense relative to the adjacent myocardium consistent with anemia. There is no large pericardial effusion. Mediastinum/Nodes: -- No mediastinal lymphadenopathy. -- No hilar lymphadenopathy. -- No axillary lymphadenopathy. -- No supraclavicular lymphadenopathy. -- Normal thyroid gland where visualized. -  Unremarkable esophagus. Lungs/Pleura: There are moderate to large bilateral pleural effusions, new  since prior study. There is adjacent compressive atelectasis. There is no pneumothorax. The trachea is unremarkable. Musculoskeletal: Again identified are multilevel compression fractures of the thoracic spine. These fractures all appear to be essentially unchanged since the prior study in September. There are multiple healing right-sided rib fractures. There is a questionable nondisplaced fracture involving the posterior eleventh rib on the left. CT ABDOMEN PELVIS FINDINGS Hepatobiliary: The liver is normal. Normal gallbladder.There is no biliary ductal dilation. Pancreas: Normal contours without ductal dilatation. No peripancreatic fluid collection. Spleen: Unremarkable. Adrenals/Urinary Tract: --Adrenal glands: Unremarkable. --Right kidney/ureter: No hydronephrosis or radiopaque kidney stones. --Left kidney/ureter: No hydronephrosis or radiopaque kidney stones. --Urinary bladder: Unremarkable. Stomach/Bowel: --Stomach/Duodenum: No hiatal hernia or other gastric abnormality. Normal duodenal course and caliber. --Small bowel: Unremarkable. --Colon: Unremarkable. --Appendix: Not visualized. No right lower quadrant inflammation or free fluid. Vascular/Lymphatic: Atherosclerotic calcification is present within the non-aneurysmal abdominal aorta, without hemodynamically significant stenosis. --No retroperitoneal lymphadenopathy. --No mesenteric lymphadenopathy. --No pelvic or inguinal lymphadenopathy. Reproductive: Unremarkable Other: No ascites or free air. There is mild diffuse body wall edema. Musculoskeletal. An old L1 compression fractures again noted. There is advanced disc height loss at the L4-L5 and L5-S1 levels. There is no definite acute displaced fracture. The patient   is status post prior total hip arthroplasty on the right. The patient is status post prior intramedullary nail placement on the left. IMPRESSION: 1. Moderate to large bilateral pleural effusions with adjacent compressive atelectasis, new since  prior study. 2. No acute intra-abdominal or pelvic pathology. 3. Multiple healing right-sided rib fractures. Questionable nondisplaced fracture involving the posterior eleventh rib on the left. 4. Chronic compression fractures are noted throughout the thoracolumbar spine. No new compression fracture was identified on today's study. 5. Anasarca. 6. Anemia. Aortic Atherosclerosis (ICD10-I70.0). Electronically Signed   By: Constance Holster M.D.   On: 10/18/2020 18:27   CT Head Wo Contrast  Result Date: 10/18/2020 CLINICAL DATA:  75 year old male with head trauma. EXAM: CT HEAD WITHOUT CONTRAST TECHNIQUE: Contiguous axial images were obtained from the base of the skull through the vertex without intravenous contrast. COMPARISON:  08/18/2020 head CT FINDINGS: Brain: No evidence of acute infarction, hemorrhage, hydrocephalus, extra-axial collection or mass lesion/mass effect. Atrophy and chronic small-vessel white matter ischemic changes are again noted. Vascular: Heavy carotid and vertebral atherosclerotic calcifications are noted. Skull: Normal. Negative for fracture or focal lesion. Sinuses/Orbits: No acute finding. Other: None. IMPRESSION: 1. No evidence of acute intracranial abnormality. 2. Atrophy and chronic small-vessel white matter ischemic changes. Electronically Signed   By: Margarette Canada M.D.   On: 10/18/2020 18:19   CT Chest Wo Contrast  Result Date: 10/18/2020 CLINICAL DATA:  Abdominal pain.  Back pain. EXAM: CT CHEST, ABDOMEN AND PELVIS WITHOUT CONTRAST CT LUMBAR SPINE WITHOUT CONTRAST TECHNIQUE: Multidetector CT imaging of the chest, abdomen and pelvis was performed following the standard protocol without IV contrast. Multiplanar CT images of the lumbar spine were reconstructed from contemporary CT of the Chest, Abdomen, and Pelvis COMPARISON:  07/06/2020 FINDINGS: CT CHEST FINDINGS Cardiovascular: The heart size is mildly enlarged. There are advanced coronary artery calcifications. There are  atherosclerotic changes of the thoracic aorta without evidence for an aneurysm. The intracardiac blood pool is hypodense relative to the adjacent myocardium consistent with anemia. There is no large pericardial effusion. Mediastinum/Nodes: -- No mediastinal lymphadenopathy. -- No hilar lymphadenopathy. -- No axillary lymphadenopathy. -- No supraclavicular lymphadenopathy. -- Normal thyroid gland where visualized. -  Unremarkable esophagus. Lungs/Pleura: There are moderate to large bilateral pleural effusions, new since prior study. There is adjacent compressive atelectasis. There is no pneumothorax. The trachea is unremarkable. Musculoskeletal: Again identified are multilevel compression fractures of the thoracic spine. These fractures all appear to be essentially unchanged since the prior study in September. There are multiple healing right-sided rib fractures. There is a questionable nondisplaced fracture involving the posterior eleventh rib on the left. CT ABDOMEN PELVIS FINDINGS Hepatobiliary: The liver is normal. Normal gallbladder.There is no biliary ductal dilation. Pancreas: Normal contours without ductal dilatation. No peripancreatic fluid collection. Spleen: Unremarkable. Adrenals/Urinary Tract: --Adrenal glands: Unremarkable. --Right kidney/ureter: No hydronephrosis or radiopaque kidney stones. --Left kidney/ureter: No hydronephrosis or radiopaque kidney stones. --Urinary bladder: Unremarkable. Stomach/Bowel: --Stomach/Duodenum: No hiatal hernia or other gastric abnormality. Normal duodenal course and caliber. --Small bowel: Unremarkable. --Colon: Unremarkable. --Appendix: Not visualized. No right lower quadrant inflammation or free fluid. Vascular/Lymphatic: Atherosclerotic calcification is present within the non-aneurysmal abdominal aorta, without hemodynamically significant stenosis. --No retroperitoneal lymphadenopathy. --No mesenteric lymphadenopathy. --No pelvic or inguinal lymphadenopathy.  Reproductive: Unremarkable Other: No ascites or free air. There is mild diffuse body wall edema. Musculoskeletal. An old L1 compression fractures again noted. There is advanced disc height loss at the L4-L5 and L5-S1 levels. There is no definite acute displaced fracture.  The patient is status post prior total hip arthroplasty on the right. The patient is status post prior intramedullary nail placement on the left. IMPRESSION: 1. Moderate to large bilateral pleural effusions with adjacent compressive atelectasis, new since prior study. 2. No acute intra-abdominal or pelvic pathology. 3. Multiple healing right-sided rib fractures. Questionable nondisplaced fracture involving the posterior eleventh rib on the left. 4. Chronic compression fractures are noted throughout the thoracolumbar spine. No new compression fracture was identified on today's study. 5. Anasarca. 6. Anemia. Aortic Atherosclerosis (ICD10-I70.0). Electronically Signed   By: Constance Holster M.D.   On: 10/18/2020 18:27   CT L-SPINE NO CHARGE  Result Date: 10/18/2020 CLINICAL DATA:  Abdominal pain.  Back pain. EXAM: CT CHEST, ABDOMEN AND PELVIS WITHOUT CONTRAST CT LUMBAR SPINE WITHOUT CONTRAST TECHNIQUE: Multidetector CT imaging of the chest, abdomen and pelvis was performed following the standard protocol without IV contrast. Multiplanar CT images of the lumbar spine were reconstructed from contemporary CT of the Chest, Abdomen, and Pelvis COMPARISON:  07/06/2020 FINDINGS: CT CHEST FINDINGS Cardiovascular: The heart size is mildly enlarged. There are advanced coronary artery calcifications. There are atherosclerotic changes of the thoracic aorta without evidence for an aneurysm. The intracardiac blood pool is hypodense relative to the adjacent myocardium consistent with anemia. There is no large pericardial effusion. Mediastinum/Nodes: -- No mediastinal lymphadenopathy. -- No hilar lymphadenopathy. -- No axillary lymphadenopathy. -- No  supraclavicular lymphadenopathy. -- Normal thyroid gland where visualized. -  Unremarkable esophagus. Lungs/Pleura: There are moderate to large bilateral pleural effusions, new since prior study. There is adjacent compressive atelectasis. There is no pneumothorax. The trachea is unremarkable. Musculoskeletal: Again identified are multilevel compression fractures of the thoracic spine. These fractures all appear to be essentially unchanged since the prior study in September. There are multiple healing right-sided rib fractures. There is a questionable nondisplaced fracture involving the posterior eleventh rib on the left. CT ABDOMEN PELVIS FINDINGS Hepatobiliary: The liver is normal. Normal gallbladder.There is no biliary ductal dilation. Pancreas: Normal contours without ductal dilatation. No peripancreatic fluid collection. Spleen: Unremarkable. Adrenals/Urinary Tract: --Adrenal glands: Unremarkable. --Right kidney/ureter: No hydronephrosis or radiopaque kidney stones. --Left kidney/ureter: No hydronephrosis or radiopaque kidney stones. --Urinary bladder: Unremarkable. Stomach/Bowel: --Stomach/Duodenum: No hiatal hernia or other gastric abnormality. Normal duodenal course and caliber. --Small bowel: Unremarkable. --Colon: Unremarkable. --Appendix: Not visualized. No right lower quadrant inflammation or free fluid. Vascular/Lymphatic: Atherosclerotic calcification is present within the non-aneurysmal abdominal aorta, without hemodynamically significant stenosis. --No retroperitoneal lymphadenopathy. --No mesenteric lymphadenopathy. --No pelvic or inguinal lymphadenopathy. Reproductive: Unremarkable Other: No ascites or free air. There is mild diffuse body wall edema. Musculoskeletal. An old L1 compression fractures again noted. There is advanced disc height loss at the L4-L5 and L5-S1 levels. There is no definite acute displaced fracture. The patient is status post prior total hip arthroplasty on the right. The  patient is status post prior intramedullary nail placement on the left. IMPRESSION: 1. Moderate to large bilateral pleural effusions with adjacent compressive atelectasis, new since prior study. 2. No acute intra-abdominal or pelvic pathology. 3. Multiple healing right-sided rib fractures. Questionable nondisplaced fracture involving the posterior eleventh rib on the left. 4. Chronic compression fractures are noted throughout the thoracolumbar spine. No new compression fracture was identified on today's study. 5. Anasarca. 6. Anemia. Aortic Atherosclerosis (ICD10-I70.0). Electronically Signed   By: Constance Holster M.D.   On: 10/18/2020 18:27   DG Chest Portable 1 View  Result Date: 10/18/2020 CLINICAL DATA:  Shortness of breath EXAM: PORTABLE CHEST 1  VIEW COMPARISON:  August 19, 2020 FINDINGS: There is cardiomegaly with pulmonary vascularity normal. There is ill-defined airspace opacity in the lower lung regions bilaterally. No consolidation. No appreciable adenopathy. No bone lesions. IMPRESSION: Ill-defined airspace opacity in the lung bases, slightly more notable on the left than on the right. Suspect bibasilar pneumonia. Question atypical organism pneumonia. Advise COVID-19 status check. Cardiomegaly with pulmonary vascular within normal limits. No adenopathy demonstrable. Electronically Signed   By: Lowella Grip III M.D.   On: 10/18/2020 15:48    Chart has been reviewed    Assessment/Plan   75 y.o. male with medical history significant of DM1, anemia of chronic disease, CKD stage V, brittle diabetes, history of CVA, dementia, aspiration pneumonia  Admitted for fluid overload  Present on Admission: . Fluid overload in the setting of worsening renal disease patient not a candidate for hemodialysis as per his primary nephrologist.  Family is aware of this Patient still makes urine will give Lasix and see if able to diurese. Patient currently not on oxygen but does have bilateral pleural  effusions. Have palliative care consult tomorrow and discuss overall goals of care If family would like to pursue aggressive interventions may need to have nephrology consult  . Type 1 diabetes mellitus with hyperglycemia ([name]) -continue home insulin and sliding scale no evidence of DKA  . Hypothyroidism -continue Synthroid and check TSH  . Hyperlipidemia associated with type 2 diabetes mellitus ([place]) continue home medications  . Hypertension associated with diabetes ([place]) -stable continue home meds Patient's   . Dementia ([place]) progressive of sundowning.  Will monitor for any delirium while hospitalized.  . CKD (chronic kidney disease) stage 4, GFR 15-29 ml/min ([name]) -continues to progress.  Per discussion with family and prior notes patient felt not to be a good candidate for hemodialysis as his prior nephrologist have discussed with patient and family. Palliative care consult for goals of care discussion Avoid hypotension and nephrotoxic medications  . Acute metabolic encephalopathy in the setting of chronic dementia. No evidence of infectious etiology at this point continue to monitor  Falls resulting in multiple rib fractures.  Order incentive spirometry PT OT assessment may need placement Secondary to significant debility  . Anemia of chronic disease chronic stable obtain anemia panel and follow   . Prolonged QT interval - - will monitor on tele avoid QT prolonging medications, rehydrate correct electrolytes  . Aspiration, chronic pulmonary discussed with family sister aware of chronic aspiration would like to continue comfort feeding family does understand that patient is at risk of recurrent pneumonia and admission secondary to recurrent aspiration We will do speech pathology consult to see if patient could tolerate any modification to his diet that would help with aspiration  Acute on chronic diastolic CHF -recent echogram showed showed preserved EF. Will diurese and monitor  fluid output  Other plan as per orders.  DVT prophylaxis:  SCD    Code Status:    Code Status: Prior FULL CODE   as per  family I had personally discussed CODE STATUS with  Family      Family Communication:   Family not at  Bedside  plan of care was discussed on the phone with  Sister   Disposition Plan:    likely will need placement for rehabilitation                       Following barriers for discharge:  Electrolytes corrected                               Anemia stable                                                        Will likely need home health,                                               Would benefit from PT/OT eval prior to DC  Ordered                   Swallow eval - SLP ordered                   Diabetes care coordinator                   Transition of care consulted                   Nutrition    consulted                                    Palliative care    consulted                 Consults called:  none   Admission status:  ED Disposition    ED Disposition Condition Comment   Admit  Hospital Area: [facility] [[id number]]  Level of Care: Telemetry Medical [104]  I expect the patient will be discharged within 24 hours: No (not a candidate for 5C-Observation unit)  Covid Evaluation: Confirmed COVID Negative  Diagnosis: Fluid overload [[id number]]  Admitting Physician: ,  [[id number]]  Attending Physician: ,  [[id number]]       Obs      Level of care    tele  For  24H      Lab Results  Component Value Date   SARSCOV2NAA NEGATIVE 10/18/2020     Precautions: admitted as  Covid Negative   PPE: Used by the provider:   P100  eye Goggles,  Gloves       10/18/2020, 11:49 PM    [facility]     after 2 AM please page floor coverage PA If 7AM-7PM, please contact the day team taking care of the patient using [email or web address]   Patient was evaluated in the context of  the global COVID-19 pandemic, which necessitated consideration that the patient might be at risk for infection with the SARS-CoV-2 virus that causes COVID-19. Institutional protocols and algorithms that pertain to the evaluation of patients at risk for COVID-19 are in a state of rapid change based on information released by regulatory bodies including the CDC and federal and state organizations. These policies and algorithms were followed during the patient's care.     

## 2020-10-18 NOTE — ED Triage Notes (Addendum)
Pt arrives from home brought in by [facility] w/ complaints of abd pain in the umbilical area radiating to the legs that started this morning after breakfast. Pt reports episodes of diarrhea. Pt has hx of DM type 1 and reported to be hyperglycemic at 397 w/ EMS. All other VSS. NAD at this time.

## 2020-10-19 DIAGNOSIS — R109 Unspecified abdominal pain: Secondary | ICD-10-CM | POA: Diagnosis present

## 2020-10-19 DIAGNOSIS — I132 Hypertensive heart and chronic kidney disease with heart failure and with stage 5 chronic kidney disease, or end stage renal disease: Secondary | ICD-10-CM | POA: Diagnosis present

## 2020-10-19 DIAGNOSIS — W19XXXA Unspecified fall, initial encounter: Secondary | ICD-10-CM | POA: Diagnosis present

## 2020-10-19 DIAGNOSIS — G9341 Metabolic encephalopathy: Secondary | ICD-10-CM | POA: Diagnosis present

## 2020-10-19 DIAGNOSIS — R4781 Slurred speech: Secondary | ICD-10-CM | POA: Diagnosis present

## 2020-10-19 DIAGNOSIS — F05 Delirium due to known physiological condition: Secondary | ICD-10-CM | POA: Diagnosis present

## 2020-10-19 DIAGNOSIS — D638 Anemia in other chronic diseases classified elsewhere: Secondary | ICD-10-CM | POA: Diagnosis not present

## 2020-10-19 DIAGNOSIS — Z8673 Personal history of transient ischemic attack (TIA), and cerebral infarction without residual deficits: Secondary | ICD-10-CM | POA: Diagnosis not present

## 2020-10-19 DIAGNOSIS — Z515 Encounter for palliative care: Secondary | ICD-10-CM

## 2020-10-19 DIAGNOSIS — Z794 Long term (current) use of insulin: Secondary | ICD-10-CM | POA: Diagnosis not present

## 2020-10-19 DIAGNOSIS — T17908A Unspecified foreign body in respiratory tract, part unspecified causing other injury, initial encounter: Secondary | ICD-10-CM | POA: Diagnosis present

## 2020-10-19 DIAGNOSIS — Z79899 Other long term (current) drug therapy: Secondary | ICD-10-CM | POA: Diagnosis not present

## 2020-10-19 DIAGNOSIS — Z96641 Presence of right artificial hip joint: Secondary | ICD-10-CM | POA: Diagnosis present

## 2020-10-19 DIAGNOSIS — Z66 Do not resuscitate: Secondary | ICD-10-CM | POA: Diagnosis present

## 2020-10-19 DIAGNOSIS — Z7189 Other specified counseling: Secondary | ICD-10-CM | POA: Diagnosis not present

## 2020-10-19 DIAGNOSIS — Z20822 Contact with and (suspected) exposure to covid-19: Secondary | ICD-10-CM | POA: Diagnosis present

## 2020-10-19 DIAGNOSIS — R197 Diarrhea, unspecified: Secondary | ICD-10-CM | POA: Diagnosis present

## 2020-10-19 DIAGNOSIS — E872 Acidosis: Secondary | ICD-10-CM | POA: Diagnosis present

## 2020-10-19 DIAGNOSIS — I5033 Acute on chronic diastolic (congestive) heart failure: Secondary | ICD-10-CM | POA: Diagnosis present

## 2020-10-19 DIAGNOSIS — E1122 Type 2 diabetes mellitus with diabetic chronic kidney disease: Secondary | ICD-10-CM | POA: Diagnosis present

## 2020-10-19 DIAGNOSIS — D631 Anemia in chronic kidney disease: Secondary | ICD-10-CM | POA: Diagnosis present

## 2020-10-19 DIAGNOSIS — E8779 Other fluid overload: Secondary | ICD-10-CM | POA: Diagnosis not present

## 2020-10-19 DIAGNOSIS — M545 Low back pain, unspecified: Secondary | ICD-10-CM | POA: Diagnosis present

## 2020-10-19 DIAGNOSIS — F0390 Unspecified dementia without behavioral disturbance: Secondary | ICD-10-CM | POA: Diagnosis present

## 2020-10-19 DIAGNOSIS — E8770 Fluid overload, unspecified: Secondary | ICD-10-CM | POA: Diagnosis present

## 2020-10-19 DIAGNOSIS — N185 Chronic kidney disease, stage 5: Secondary | ICD-10-CM | POA: Diagnosis present

## 2020-10-19 DIAGNOSIS — R0602 Shortness of breath: Secondary | ICD-10-CM | POA: Diagnosis present

## 2020-10-19 DIAGNOSIS — N184 Chronic kidney disease, stage 4 (severe): Secondary | ICD-10-CM | POA: Diagnosis not present

## 2020-10-19 DIAGNOSIS — E785 Hyperlipidemia, unspecified: Secondary | ICD-10-CM | POA: Diagnosis present

## 2020-10-19 DIAGNOSIS — Z8249 Family history of ischemic heart disease and other diseases of the circulatory system: Secondary | ICD-10-CM | POA: Diagnosis not present

## 2020-10-19 DIAGNOSIS — E039 Hypothyroidism, unspecified: Secondary | ICD-10-CM | POA: Diagnosis present

## 2020-10-19 DIAGNOSIS — S2241XA Multiple fractures of ribs, right side, initial encounter for closed fracture: Secondary | ICD-10-CM | POA: Diagnosis present

## 2020-10-19 LAB — COMPREHENSIVE METABOLIC PANEL
ALT: 16 U/L (ref 0–44)
AST: 25 U/L (ref 15–41)
Albumin: 2.1 g/dL — ABNORMAL LOW (ref 3.5–5.0)
Alkaline Phosphatase: 60 U/L (ref 38–126)
Anion gap: 15 (ref 5–15)
BUN: 94 mg/dL — ABNORMAL HIGH (ref 8–23)
CO2: 24 mmol/L (ref 22–32)
Calcium: 7.1 mg/dL — ABNORMAL LOW (ref 8.9–10.3)
Chloride: 105 mmol/L (ref 98–111)
Creatinine, Ser: 7.56 mg/dL — ABNORMAL HIGH (ref 0.61–1.24)
GFR, Estimated: 7 mL/min — ABNORMAL LOW (ref >60)
Glucose, Bld: 285 mg/dL — ABNORMAL HIGH (ref 70–99)
Potassium: 3.4 mmol/L — ABNORMAL LOW (ref 3.5–5.1)
Sodium: 144 mmol/L (ref 135–145)
Total Bilirubin: 0.8 mg/dL (ref 0.3–1.2)
Total Protein: 5.3 g/dL — ABNORMAL LOW (ref 6.5–8.1)

## 2020-10-19 LAB — CBC WITH DIFFERENTIAL/PLATELET
Abs Immature Granulocytes: 0.01 10*3/uL (ref 0.00–0.07)
Basophils Absolute: 0 10*3/uL (ref 0.0–0.1)
Basophils Relative: 1 %
Eosinophils Absolute: 0.2 10*3/uL (ref 0.0–0.5)
Eosinophils Relative: 4 %
HCT: 23.5 % — ABNORMAL LOW (ref 39.0–52.0)
Hemoglobin: 7.4 g/dL — ABNORMAL LOW (ref 13.0–17.0)
Immature Granulocytes: 0 %
Lymphocytes Relative: 25 %
Lymphs Abs: 1.2 10*3/uL (ref 0.7–4.0)
MCH: 29.7 pg (ref 26.0–34.0)
MCHC: 31.5 g/dL (ref 30.0–36.0)
MCV: 94.4 fL (ref 80.0–100.0)
Monocytes Absolute: 0.6 10*3/uL (ref 0.1–1.0)
Monocytes Relative: 12 %
Neutro Abs: 2.9 10*3/uL (ref 1.7–7.7)
Neutrophils Relative %: 58 %
Platelets: 218 10*3/uL (ref 150–400)
RBC: 2.49 MIL/uL — ABNORMAL LOW (ref 4.22–5.81)
RDW: 15.8 % — ABNORMAL HIGH (ref 11.5–15.5)
WBC: 4.9 10*3/uL (ref 4.0–10.5)
nRBC: 0 % (ref 0.0–0.2)

## 2020-10-19 LAB — GLUCOSE, CAPILLARY
Glucose-Capillary: 275 mg/dL — ABNORMAL HIGH (ref 70–99)
Glucose-Capillary: 196 mg/dL — ABNORMAL HIGH (ref 70–99)
Glucose-Capillary: 226 mg/dL — ABNORMAL HIGH (ref 70–99)
Glucose-Capillary: 235 mg/dL — ABNORMAL HIGH (ref 70–99)
Glucose-Capillary: 227 mg/dL — ABNORMAL HIGH (ref 70–99)
Glucose-Capillary: 240 mg/dL — ABNORMAL HIGH (ref 70–99)

## 2020-10-19 LAB — PHOSPHORUS
Phosphorus: 7.3 mg/dL — ABNORMAL HIGH (ref 2.5–4.6)
Phosphorus: 7.4 mg/dL — ABNORMAL HIGH (ref 2.5–4.6)

## 2020-10-19 LAB — MAGNESIUM: Magnesium: 2 mg/dL (ref 1.7–2.4)

## 2020-10-19 LAB — TSH: TSH: 8.945 u[IU]/mL — ABNORMAL HIGH (ref 0.350–4.500)

## 2020-10-19 LAB — HEMOGLOBIN A1C
Hgb A1c MFr Bld: 10 % — ABNORMAL HIGH (ref 4.8–5.6)
Mean Plasma Glucose: 240.3 mg/dL

## 2020-10-19 LAB — MRSA PCR SCREENING: MRSA by PCR: POSITIVE — AB

## 2020-10-19 LAB — CK: Total CK: 671 U/L — ABNORMAL HIGH (ref 49–397)

## 2020-10-19 MED ORDER — DOCUSATE SODIUM 100 MG PO CAPS
100.0000 mg | ORAL_CAPSULE | Freq: Two times a day (BID) | ORAL | Status: DC
  Administered 2020-10-19 – 2020-10-22 (×6): 100 mg via ORAL
  Filled 2020-10-19 (×6): qty 1

## 2020-10-19 MED ORDER — HYDROCODONE-ACETAMINOPHEN 5-325 MG PO TABS: 1.0000 | ORAL_TABLET | ORAL | Status: DC | PRN

## 2020-10-19 MED ORDER — ALBUTEROL SULFATE (2.5 MG/3ML) 0.083% IN NEBU: 2.5000 mg | INHALATION_SOLUTION | RESPIRATORY_TRACT | Status: DC | PRN

## 2020-10-19 MED ORDER — MUPIROCIN 2 % EX OINT
1.0000 "application " | TOPICAL_OINTMENT | Freq: Two times a day (BID) | CUTANEOUS | Status: DC
  Administered 2020-10-19 – 2020-10-22 (×7): 1 via NASAL
  Filled 2020-10-19: qty 22

## 2020-10-19 MED ORDER — HYDRALAZINE HCL 50 MG PO TABS
100.0000 mg | ORAL_TABLET | Freq: Three times a day (TID) | ORAL | Status: DC
  Administered 2020-10-19 – 2020-10-22 (×11): 100 mg via ORAL
  Filled 2020-10-19 (×11): qty 2

## 2020-10-19 MED ORDER — INFLUENZA VAC A&B SA ADJ QUAD 0.5 ML IM PRSY
0.5000 mL | PREFILLED_SYRINGE | INTRAMUSCULAR | Status: DC
  Filled 2020-10-19 (×2): qty 0.5

## 2020-10-19 MED ORDER — SODIUM CHLORIDE 0.9% FLUSH: 3.0000 mL | INTRAVENOUS | Status: DC | PRN

## 2020-10-19 MED ORDER — SODIUM CHLORIDE 0.9% FLUSH
3.0000 mL | Freq: Two times a day (BID) | INTRAVENOUS | Status: DC
  Administered 2020-10-19 – 2020-10-22 (×8): 3 mL via INTRAVENOUS

## 2020-10-19 MED ORDER — CHLORHEXIDINE GLUCONATE CLOTH 2 % EX PADS
6.0000 | MEDICATED_PAD | Freq: Every day | CUTANEOUS | Status: DC
  Administered 2020-10-19 – 2020-10-22 (×4): 6 via TOPICAL

## 2020-10-19 MED ORDER — LEVOTHYROXINE SODIUM 25 MCG PO TABS
25.0000 ug | ORAL_TABLET | Freq: Every day | ORAL | Status: DC
  Administered 2020-10-19 – 2020-10-22 (×4): 25 ug via ORAL
  Filled 2020-10-19 (×4): qty 1

## 2020-10-19 MED ORDER — SODIUM BICARBONATE 650 MG PO TABS
650.0000 mg | ORAL_TABLET | Freq: Two times a day (BID) | ORAL | Status: DC
  Administered 2020-10-19 – 2020-10-22 (×8): 650 mg via ORAL
  Filled 2020-10-19 (×8): qty 1

## 2020-10-19 MED ORDER — CALCIUM ACETATE (PHOS BINDER) 667 MG PO CAPS
1334.0000 mg | ORAL_CAPSULE | Freq: Three times a day (TID) | ORAL | Status: DC
  Administered 2020-10-19 – 2020-10-22 (×10): 1334 mg via ORAL
  Filled 2020-10-19 (×10): qty 2

## 2020-10-19 MED ORDER — ACETAMINOPHEN 325 MG PO TABS: 650.0000 mg | ORAL_TABLET | Freq: Four times a day (QID) | ORAL | Status: DC | PRN

## 2020-10-19 MED ORDER — SODIUM CHLORIDE 0.9 % IV SOLN: 250.0000 mL | INTRAVENOUS | Status: DC | PRN

## 2020-10-19 MED ORDER — INSULIN GLARGINE 100 UNIT/ML ~~LOC~~ SOLN
5.0000 [IU] | Freq: Every day | SUBCUTANEOUS | Status: DC
  Administered 2020-10-19 – 2020-10-21 (×4): 5 [IU] via SUBCUTANEOUS
  Filled 2020-10-19 (×6): qty 0.05

## 2020-10-19 MED ORDER — AMLODIPINE BESYLATE 10 MG PO TABS
10.0000 mg | ORAL_TABLET | Freq: Every day | ORAL | Status: DC
  Administered 2020-10-19 – 2020-10-22 (×4): 10 mg via ORAL
  Filled 2020-10-19 (×4): qty 1

## 2020-10-19 MED ORDER — POLYETHYLENE GLYCOL 3350 17 G PO PACK: 17.0000 g | PACK | Freq: Every day | ORAL | Status: DC | PRN

## 2020-10-19 MED ORDER — ACETAMINOPHEN 650 MG RE SUPP: 650.0000 mg | Freq: Four times a day (QID) | RECTAL | Status: DC | PRN

## 2020-10-19 MED ORDER — ATORVASTATIN CALCIUM 40 MG PO TABS
40.0000 mg | ORAL_TABLET | Freq: Every day | ORAL | Status: DC
  Administered 2020-10-19: 40 mg via ORAL
  Filled 2020-10-19: qty 1

## 2020-10-19 NOTE — Consult Note (Signed)
Consultation Note Date: 10/19/2020   Patient Name: [name] [name]  DOB: 10-Mar-1946  MRN: [id number]  Age / Sex: 75 y.o., male  PCP: Libby Maw, MD Referring Physician: Karie Kirks, DO  Reason for Consultation: Establishing goals of care  HPI/Patient Profile:[name] [name] is a 75 y.o. male with medical history significant of DM1, anemia of chronic disease, CKD stage V, brittle diabetes, history of CVA, dementia, aspiration pneumonia  Clinical Assessment and Goals of Care: Patient is sitting in bed watching t.v. he states he lives with his mother, and tells me she is 52. He states he lives with her because he was advised to do so given his DM. He states he has sisters that come in to help their mother as needed, but states he takes care of himself.   He states he just wants to go home, and be at home with his family. Began to discuss his multiple diagnoses, and introduced subject of his swallowing. He immediately became upset and said "nobody can tell me if I have problems with my swallowing, only I know that! Please leave!"  He then advised that I could talk to his sisters or children.   Called patient's sister and VM left. Shortly after, RN inquired if I had tried to call the sister that she had called back. Attempted to call sister again with no answer.     SUMMARY OF RECOMMENDATIONS   Patient states he wants to go home. Patient did not want to discuss [place]. Appropriate for outpatient hospice if his goals are in line.   Prognosis:   < 6 months      Primary Diagnoses: Present on Admission: . Fluid overload . Type 1 diabetes mellitus with hyperglycemia ([place]) . Hypothyroidism . Hyperlipidemia associated with type 2 diabetes mellitus ([place]) . Hypertension associated with diabetes ([place]) . Dementia ([place]) . CKD (chronic kidney disease) stage 4, GFR 15-29 ml/min ([name]) . Acute metabolic  encephalopathy . Anemia of chronic disease . Prolonged QT interval . Aspiration, chronic pulmonary . Volume overload   I have reviewed the medical record, interviewed the patient and family, and examined the patient. The following aspects are pertinent.  Past Medical History:  Diagnosis Date  . 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 Closed comminuted intertrochanteric fracture of left femur ([place])   . Diabetes mellitus without complication ([place])   . Diabetic hyperosmolar non-ketotic state ([place]) 05/15/2016  . DKA (diabetic ketoacidoses) 05/09/2016  . Hypertension   . Hypoglycemia 11/29/2019  . Postoperative anemia due to acute blood loss 06/18/2016  . Retroperitoneal hematoma 06/18/2016  . Stroke ([place])   . Vitamin B 12 deficiency 06/18/2016   Social History   Socioeconomic History  . Marital status: Single    Spouse name: Not on file  . Number of children: 2  . Years of education: 45  . Highest education level: Not on file  Occupational History  . Occupation: Retired  Tobacco Use  . Smoking status: Never Smoker  . Smokeless  tobacco: Never Used  Vaping Use  . Vaping Use: Never used  Substance and Sexual Activity  . Alcohol use: No  . Drug use: No  . Sexual activity: Not Currently  Other Topics Concern  . Not on file  Social History Narrative   Fun: Likes to do handyman related projects, photography.    Denies religious beliefs effecting health care.    Social Determinants of Health   Financial Resource Strain: Low Risk   . Difficulty of Paying Living Expenses: Not very hard  Food Insecurity: Food Insecurity Present  . Worried About Charity fundraiser in the Last Year: Sometimes true  . Ran Out of Food in the Last Year: Never true  Transportation Needs: No Transportation Needs  . Lack of Transportation (Medical): No  . Lack of Transportation (Non-Medical): No  Physical Activity: Inactive  . Days of Exercise per Week:  0 days  . Minutes of Exercise per Session: 0 min  Stress: No Stress Concern Present  . Feeling of Stress : Not at all  Social Connections: Socially Isolated  . Frequency of Communication with Friends and Family: More than three times a week  . Frequency of Social Gatherings with Friends and Family: More than three times a week  . Attends Religious Services: Never  . Active Member of Clubs or Organizations: No  . Attends Archivist Meetings: Never  . Marital Status: Never married   Family History  Problem Relation Age of Onset  . Alzheimer's disease Mother   . Hypertension Mother   . Hyperlipidemia Father   . Hypertension Father   . Healthy Maternal Grandmother   . Pneumonia Maternal Grandfather    Scheduled Meds: . amLODipine  10 mg Oral Daily  . atorvastatin  40 mg Oral Daily  . calcium acetate  1,334 mg Oral TID WC  . Chlorhexidine Gluconate Cloth  6 each Topical Q0600  . docusate sodium  100 mg Oral BID  . furosemide  80 mg Intravenous Q12H  . hydrALAZINE  100 mg Oral TID  . [START ON 10/20/2020] influenza vaccine adjuvanted  0.5 mL Intramuscular Tomorrow-1000  . insulin aspart  0-6 Units Subcutaneous Q4H  . insulin glargine  5 Units Subcutaneous QHS  . levothyroxine  25 mcg Oral Q0600  . mupirocin ointment  1 application Nasal BID  . sodium bicarbonate  650 mg Oral BID  . sodium chloride flush  3 mL Intravenous Q12H   Continuous Infusions: . sodium chloride     PRN Meds:.sodium chloride, acetaminophen **OR** acetaminophen, albuterol, HYDROcodone-acetaminophen, polyethylene glycol, sodium chloride flush Medications Prior to Admission:  Prior to Admission medications   Medication Sig Start Date End Date Taking? Authorizing Provider  acetaminophen (TYLENOL) 325 MG tablet Take 2 tablets (650 mg total) by mouth every 6 (six) hours as needed for up to 30 doses for mild pain or moderate pain. 07/06/20  Yes Wyvonnia Dusky, MD  amLODipine (NORVASC) 10 MG tablet Take  1 tablet (10 mg total) by mouth daily. 06/26/20  Yes Libby Maw, MD  atorvastatin (LIPITOR) 40 MG tablet Take 1 tablet (40 mg total) by mouth daily. 06/26/20  Yes Libby Maw, MD  furosemide (LASIX) 40 MG tablet Take 40 mg by mouth 2 (two) times daily. 07/30/20  Yes [provider]  hydrALAZINE (APRESOLINE) 100 MG tablet TAKE 1 TABLET BY MOUTH 3 TIMES A DAY. 07/30/20  Yes Libby Maw, MD  Insulin Glargine [facility]) 100 UNIT/ML Inject 5 Units into  the skin at bedtime. 08/20/20  Yes Vann, Jessica U, DO  insulin lispro (HUMALOG KWIKPEN) 100 UNIT/ML KwikPen Inject 0-10 Units into the skin 3 (three) times daily as needed (blood sugar). Max daily 30 units 08/20/20  Yes Vann, Jessica U, DO  levothyroxine (SYNTHROID) 25 MCG tablet TAKE 1 TABLET EACH DAY. Patient taking differently: Take 25 mcg by mouth daily. 09/27/20  Yes Dutch Quint B, [name]  sodium bicarbonate 650 MG tablet Take 1 tablet (650 mg total) by mouth 2 (two) times daily. 04/19/20  Yes [name], MD  vitamin B-12 1000 MCG tablet Take 1 tablet (1,000 mcg total) by mouth daily. 12/05/19  Yes [name], MD  ACCU-CHEK GUIDE test strip USE AS DIRECTED 06/12/20   Azzie Glatter, [name]  Blood Glucose Monitoring Suppl (ACCU-CHEK GUIDE) w/Device KIT 1 Device by Does not apply route QID. 11/02/19   Azzie Glatter, [name]  glucose blood (ACCU-CHEK GUIDE) test strip Use as instructed 11/03/18   Azzie Glatter, [name]  glucose blood (TRUE METRIX BLOOD GLUCOSE TEST) test strip CHECK BLOOD SUGAR UP TO 4 TIMES A DAY. Patient taking differently: 1 each by Other route See admin instructions. CHECK BLOOD SUGAR UP TO 2 TIMES A DAY. 05/25/18   Azzie Glatter, [name]  Insulin Pen Needle 32G X 4 MM MISC 1 Device by Does not apply route in the morning, at noon, in the evening, and at bedtime. 01/03/20   [name], [name], MD  Insulin Syringe-Needle U-100 (BD INSULIN SYRINGE U/F) 31G X 5/16" 0.3 ML MISC Inject  1 each as directed 2 (two) times daily. as directed 11/02/19   Azzie Glatter, [name]   No Known Allergies Review of Systems  All other systems reviewed and are negative.   Physical Exam Pulmonary:     Effort: Pulmonary effort is normal.  Neurological:     Mental Status: He is alert.     Vital Signs: BP (!) 141/73 (BP Location: Left Arm)   Pulse 84   Temp 97.8 F (36.6 C) (Oral)   Resp 17   Ht _0  (1.753 m)   Wt 73.5 kg   SpO2 100%   BMI 23.93 kg/m  Pain Scale: 0-10   Pain Score: 0-No pain   SpO2: SpO2: 100 % O2 Device:SpO2: 100 % O2 Flow Rate: .   IO: Intake/output summary:   Intake/Output Summary (Last 24 hours) at 10/19/2020 1526 Last data filed at 10/19/2020 1300 Gross per 24 hour  Intake 530 ml  Output 1000 ml  Net -470 ml    LBM: Last BM Date: 10/19/20 Baseline Weight: Weight: 73.5 kg Most recent weight: Weight: 73.5 kg       Time In: 3:00 Time Out: 3:30 Time Total: 30 min Greater than 50%  of this time was spent counseling and coordinating care related to the above assessment and plan.  Signed by: Asencion Gowda, NP   Please contact Palliative Medicine Team phone at [phone] for questions and concerns.  For individual provider: See Shea Evans

## 2020-10-19 NOTE — Evaluation (Signed)
Occupational Therapy Evaluation Patient Details Name: [name] [name] MRN: [id number] DOB: 01-31-1946 Today's Date: 10/19/2020    History of Present Illness Myers Tutterow is a 75 y.o. male with medical history significant of DM1, anemia of chronic disease, CKD stage V, brittle diabetes, history of CVA, dementia, aspiration pneumonia      Presented with abdominal pain in abdominal and umbilical area radiating to the legs started at breakfast associated diarrhea. He has had some worsening shortness of breath. He has not been eating well so today sister ordered him pizza. He refused to eat it so sister asked him if he want to go tot he hospital and he said yes.   Clinical Impression   Pt presents with decline in function and safety with ADLs and ADL mobility with impaired balance and endurance; hx of cognitive impairments as well. Pt with functional deficits listed below and would benefit from acute OT services to address impairments to maximize level of function and safety    Follow Up Recommendations  Home health OT;Supervision - Intermittent    Equipment Recommendations  None recommended by OT    Recommendations for Other Services       Precautions / Restrictions Precautions Precautions: Fall Precaution Comments: history of falls Restrictions Weight Bearing Restrictions: No      Mobility Bed Mobility Overal bed mobility: Needs Assistance Bed Mobility: Supine to Sit     Supine to sit: Min assist          Transfers Overall transfer level: Needs assistance Equipment used: Rolling walker (2 wheeled) Transfers: Sit to/from Stand Sit to Stand: Min assist              Balance Overall balance assessment: Needs assistance Sitting-balance support: Feet supported Sitting balance-Leahy Scale: Good Sitting balance - Comments: able to sit unsupported at edge of bed.   Standing balance support: Bilateral upper extremity supported;During functional activity Standing balance-Leahy  Scale: Poor Standing balance comment: reliant on RW and external support for safety                           ADL either performed or assessed with clinical judgement   ADL                                               Vision Patient Visual Report: No change from baseline       Perception     Praxis      Pertinent Vitals/Pain Pain Assessment: No/denies pain     Hand Dominance Right   Extremity/Trunk Assessment Upper Extremity Assessment Upper Extremity Assessment: Overall WFL for tasks assessed   Lower Extremity Assessment Lower Extremity Assessment: Defer to PT evaluation   Cervical / Trunk Assessment Cervical / Trunk Assessment: Kyphotic   Communication Communication Communication: No difficulties;Other (comment) (garbled speech at times)   Cognition Arousal/Alertness: Awake/alert Behavior During Therapy: WFL for tasks assessed/performed Overall Cognitive Status: No family/caregiver present to determine baseline cognitive functioning                                 General Comments: history of cognitive impairments   General Comments       Exercises     Shoulder Instructions      Home Living Family/patient expects to  be discharged to:: Private residence Living Arrangements: Other relatives Available Help at Discharge: Family Type of Home: House Home Access: Elevator           Bathroom Shower/Tub: Walk-in shower   Bathroom Toilet: [place]: Environmental consultant - 2 wheels   Additional Comments: patient unable to provide home info. Per admission states he lives at home with family, he reports he lives alone. Info obatined from previous chart      Prior Functioning/Environment          Comments: Unsure if patient using equipment at baseline        OT Problem List: Impaired balance (sitting and/or standing);Decreased activity tolerance;Decreased knowledge of use of DME or AE;Decreased  cognition;Decreased safety awareness      OT Treatment/Interventions: Self-care/ADL training;Therapeutic exercise;DME and/or AE instruction;Balance training;Patient/family education    OT Goals(Current goals can be found in the care plan section) Acute Rehab OT Goals Patient Stated Goal: to return home, lay in bed OT Goal Formulation: With patient Time For Goal Achievement: 11/02/20 Potential to Achieve Goals: Good ADL Goals Pt Will Perform Grooming: with min guard assist;with supervision;with set-up;standing Pt Will Perform Upper Body Bathing: with supervision;with set-up;sitting Pt Will Perform Lower Body Bathing: with min assist;sit to/from stand Pt Will Perform Upper Body Dressing: with supervision;with set-up;sitting Pt Will Perform Lower Body Dressing: with min assist;sit to/from stand Pt Will Transfer to Toilet: with min guard assist;ambulating;regular height toilet;grab bars Pt Will Perform Toileting - Clothing Manipulation and hygiene: with min guard assist;with supervision;sitting/lateral leans  OT Frequency: Min 2X/week   Barriers to D/C:            Co-evaluation              AM-PAC OT "6 Clicks" Daily Activity     Outcome Measure Help from another person eating meals?: None Help from another person taking care of personal grooming?: A Little Help from another person toileting, which includes using toliet, bedpan, or urinal?: A Lot Help from another person bathing (including washing, rinsing, drying)?: A Lot Help from another person to put on and taking off regular upper body clothing?: A Little Help from another person to put on and taking off regular lower body clothing?: A Lot 6 Click Score: 16   End of Session Equipment Utilized During Treatment: Gait belt;Rolling walker  Activity Tolerance: Patient limited by fatigue Patient left: in chair;with call bell/phone within reach  OT Visit Diagnosis: Other abnormalities of gait and mobility  (R26.89);Unsteadiness on feet (R26.81);Muscle weakness (generalized) (M62.81)                Time: 2725-3664 OT Time Calculation (min): 23 min Charges:  OT General Charges $OT Visit: 1 Visit OT Evaluation $OT Eval Moderate Complexity: 1 Mod    Britt Bottom 10/19/2020, 3:46 PM

## 2020-10-19 NOTE — Evaluation (Signed)
Clinical/Bedside Swallow Evaluation Patient Details  Name: [name] [name] MRN: [id number] Date of Birth: October 27, 1945  Today's Date: 10/19/2020 Time: SLP Start Time (ACUTE ONLY): 5427 SLP Stop Time (ACUTE ONLY): 1158 SLP Time Calculation (min) (ACUTE ONLY): 10 min  Past Medical History:  Past Medical History:  Diagnosis Date  . 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 Closed comminuted intertrochanteric fracture of left femur ([place])   . Diabetes mellitus without complication ([place])   . Diabetic hyperosmolar non-ketotic state ([place]) 05/15/2016  . DKA (diabetic ketoacidoses) 05/09/2016  . Hypertension   . Hypoglycemia 11/29/2019  . Postoperative anemia due to acute blood loss 06/18/2016  . Retroperitoneal hematoma 06/18/2016  . Stroke ([place])   . Vitamin B 12 deficiency 06/18/2016   Past Surgical History:  Past Surgical History:  Procedure Laterality Date  . HIP ARTHROPLASTY Right 02/05/2013   Procedure: ARTHROPLASTY BIPOLAR HIP;  Surgeon: [name], MD;  Location: [facility];  Service: Orthopedics;  Laterality: Right;  . INTRAMEDULLARY (IM) NAIL INTERTROCHANTERIC Left 06/16/2016   Procedure: INTRAMEDULLARY (IM) NAIL INTERTROCHANTRIC;  Surgeon: Rod Can, MD;  Location: [place];  Service: Orthopedics;  Laterality: Left;   HPI:  75 y.o. male with medical history significant of DM1, anemia of chronic disease, CKD stage V, brittle diabetes, history of CVA, dementia, aspiration pneumonia.  Presented with abdominal pain in abdominal and umbilical area radiating to the legs started at breakfast associated diarrhea.  Admitted for fluid overload   Assessment / Plan / Recommendation Clinical Impression   Pt adamantly declined presentations of purees and solid textures at this time but demonstrated no overt s/s of aspiration with sips of water.  After completion of evaluation, lunch tray was delivered and therapist noted x2 instances of coughing with meal from  outside of pt's room which appears consistent with his presentation during 08/2020 admission.  Pt is unlikely to be compliant with any recommended diet modifications and family has been educated by SLPs on previous admissions regarding pt's risk of aspiration.  At this time I see no reason to further modify pt's diet as I feel it would negatively impact his quality of life with minimal impact on his swallowing safety.  As a result, py may continue on a regular diet with thin liquids and ST will sign off at this time.   SLP Visit Diagnosis: Dysphagia, unspecified (R13.10)    Aspiration Risk  Mild aspiration risk    Diet Recommendation Regular;Thin liquid   Liquid Administration via: Cup;Straw Medication Administration: Whole meds with liquid Supervision: Intermittent supervision to cue for compensatory strategies Compensations: Minimize environmental distractions;Slow rate;Small sips/bites Postural Changes: Seated upright at 90 degrees;Remain upright for at least 30 minutes after po intake    Other  Recommendations Oral Care Recommendations: Oral care BID   Follow up Recommendations None        Swallow Study   General HPI: 75 y.o. male with medical history significant of DM1, anemia of chronic disease, CKD stage V, brittle diabetes, history of CVA, dementia, aspiration pneumonia.  Presented with abdominal pain in abdominal and umbilical area radiating to the legs started at breakfast associated diarrhea.  Admitted for fluid overload Type of Study: Bedside Swallow Evaluation Previous Swallow Assessment: 08/2020 Diet Prior to this Study: Regular;Thin liquids Temperature Spikes Noted: No Respiratory Status: Room air History of Recent Intubation: No Behavior/Cognition: Alert;Cooperative;Pleasant mood Oral Cavity - Dentition: Edentulous Vision: Functional for self-feeding Self-Feeding Abilities: Able to feed self Patient Positioning: Upright in  chair Baseline Vocal Quality: Normal     Oral/Motor/Sensory Function Overall Oral Motor/Sensory Function: Within functional limits   Ice Chips     Thin Liquid Thin Liquid: Within functional limits    Nectar Thick     Honey Thick     Puree     Solid            Nikko Quast, Selinda Orion 10/19/2020,12:06 PM

## 2020-10-19 NOTE — Progress Notes (Addendum)
PROGRESS NOTE  Malikye Reppond HGD:[id number] DOB: Aug 12, 1946 DOA: 10/18/2020 PCP: Libby Maw, MD  Brief History   Harrington Jobe is a 75 y.o. male with medical history significant of DM1, anemia of chronic disease, CKD stage V, brittle diabetes, history of CVA, dementia, aspiration pneumonia    Presented with abdominal pain in abdominal and umbilical area radiating to the legs started at breakfast associated diarrhea. He has had some worsening shortness of breath. He has not been eating well so today sister ordered him pizza. He refused to eat it so sister asked him if he want to go tot he hospital and he said yes.  No fevers or chills unsure if he has any cough.  He has had falls and since then had some back pain. Per family he started to gurgle when he is breathing he continues to cough when he is eating. Family did confirm a fall but unsure if he hit his head. At his baseline he has slurred speech. Family reports at home blood sugars 200-300s.  And family gave him some insulin 5 units prior to arrival. Patient recently admitted in November for altered mental status He has been having recurrent aspiration. During last admission refused to participate with SLP exam. Given that he will likely have recurrent aspiration he was transitioned to hospice. Patient was made DO NOT RESUSCITATE DO NOT INTUBATE.  He does have history of CKD has been followed up by outpatient nephrology who felt he is not a good dialysis candidate he is on bicarbonate and Lasix. Plan was for patient to be seen by hospice as an outpatient family states that they were not seen by hospice. They called today and left a VM.  [facility] were consulted to admit the patient for further evaluation and treatment. The patient was admitted to a telemetry bed. He is receiving lasix 80 mg IV bid. Palliative care was consulted. The patient himself was not interested in talking to them, and family could not be reached.  I  have spoken with the patient's sister [name]. Palliative care had tried to talk to her earlier and [name] was not able to get to the phone in time. The patient himself has dementia and [name] takes care of him and makes all decisions for him. She also takes care of their 64 yr old mother at her home. She wants the patient to discharge to her home with hospice. She does not wish him to have aggressive treatment for his renal disease.  Consultants  . Palliative care  Procedures  . None  Antibiotics   Anti-infectives (From admission, onward)   None    .  Subjective  The patient is resting comfortably. No new complaints. He states that he is feeling better.  Objective   Vitals:  Vitals:   10/19/20 0408 10/19/20 1438  BP: (!) 167/89 (!) 141/73  Pulse: 88 84  Resp: 17 17  Temp: 97.8 F (36.6 C) 97.8 F (36.6 C)  SpO2: 100% 100%    Exam:  Constitutional:  . The patient is awake, alert, and oriented x 3. No acute distress. Respiratory:  . No increased work of breathing. . No wheezes or rhonchi . Positve for rales at bases bilaterally. . No tactile fremitus Cardiovascular:  . Regular rate and rhythm . No murmurs, ectopy, or gallups. . No lateral PMI. No thrills. Abdomen:  . Abdomen is soft, non-tender, non-distended . No hernias, masses, or organomegaly . Normoactive bowel sounds.  Musculoskeletal:  . No cyanosis, clubbing,  or edema Skin:  . No rashes, lesions, ulcers . palpation of skin: no induration or nodules Neurologic:  . CN 2-12 intact . Sensation all 4 extremities intact Psychiatric:  . Mental status o Mood, affect appropriate o Orientation to person, place, time  . judgment and insight appear intact  I have personally reviewed the following:   Today's Data  . Vitals, CMP, CBC, CK, Hba1c, Glucoses  Micro Data  . 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 CT chest  Cardiology Data  . Prolonged QTc  Scheduled Meds: . amLODipine  10 mg Oral Daily  .  atorvastatin  40 mg Oral Daily  . calcium acetate  1,334 mg Oral TID WC  . Chlorhexidine Gluconate Cloth  6 each Topical Q0600  . docusate sodium  100 mg Oral BID  . furosemide  80 mg Intravenous Q12H  . hydrALAZINE  100 mg Oral TID  . [START ON 10/20/2020] influenza vaccine adjuvanted  0.5 mL Intramuscular Tomorrow-1000  . insulin aspart  0-6 Units Subcutaneous Q4H  . insulin glargine  5 Units Subcutaneous QHS  . levothyroxine  25 mcg Oral Q0600  . mupirocin ointment  1 application Nasal BID  . sodium bicarbonate  650 mg Oral BID  . sodium chloride flush  3 mL Intravenous Q12H   Continuous Infusions: . sodium chloride      Active Problems:   Anemia of chronic disease   Acute metabolic encephalopathy   Hyperlipidemia associated with type 2 diabetes mellitus ([place])   Hypertension associated with diabetes ([place])   History of cerebrovascular accident (CVA) in adulthood   Dementia ([place])   CKD (chronic kidney disease) stage 4, GFR 15-29 ml/min ([name])   Hypothyroidism   Type 1 diabetes mellitus with hyperglycemia ([name])   Fluid overload   Prolonged QT interval   Aspiration, chronic pulmonary   Volume overload   LOS: 0 days   A & P  Fluid Overload: with Pleural effusions.  Improved on lasix 80 mg IV bid. Monitor volume status.   Acute metabolic encephalopathy in the setting of dementia: The patient's sister [name] states that that patient is unable to make decisions for himself. She states that she does this. Unknown if this has been documented in advanced directive or not. She states that the patient's son knows nothing about his father's health.  CKD IV: The patient is not a candidate for dialysis. The patient's sister wants hospice. ' Metabolic Acidosis due to CKD IV: Sodium bicarbonate is being given by mouth.   Prolonged QTc: Noted. Avoid QT prolonging medications.  DM II: The patient is receiving lantus 5 units sub Q daily with FSBS and SSI. Glucoses in the last 24 hours have run  from 148 - 275.   Chronic pulmonary aspiration: Risk of aspiration/choking/asphyxia is known. The patient is still a full code. Await palliative care discussion with family.  I have seen and examined this patient myself. I have spent 43 minutes in his evaluation and care.  DVT Prophylaxis: SCD's CODE STATUS: Full Code. Attempted to discuss with sister, [name], but she evaded the question, but does want home hospice care. Family communication: I have discussed the patient at length with his sister [name] who states that she is his Media planner. All questions answered to the best of my ability. Disposition: Status is: Inpatient  Remains inpatient appropriate because:Unsafe d/c plan   Dispo: The patient is from: Home              Anticipated  d/c is to: Home with hospcie              Anticipated d/c date is: 2 days              Patient currently is not medically stable to d/c.  Tarini Carrier, DO [facility] Direct contact: see [email or web address]  7PM-7AM contact night coverage as above 10/19/2020, 4:50 PM  LOS: 0 days

## 2020-10-19 NOTE — Evaluation (Signed)
Physical Therapy Evaluation Patient Details Name: [name] [name] MRN: [id number] DOB: 1946/02/15 Today's Date: 10/19/2020   History of Present Illness  [name] [name] is a 75 y.o. male with medical history significant of DM1, anemia of chronic disease, CKD stage V, brittle diabetes, history of CVA, dementia, aspiration pneumonia      Presented with abdominal pain in abdominal and umbilical area radiating to the legs started at breakfast associated diarrhea. He has had some worsening shortness of breath. He has not been eating well so today sister ordered him pizza. He refused to eat it so sister asked him if he want to go tot he hospital and he said yes.  Clinical Impression  Patient received sleeping in bed, somewhat difficult to rouse. He wants to stay in bed, but with encouragement he is agreeable. Patient requires min assist for bed mobility and transfers. Ambulated with RW ~7 feet with min assist. Cues needed for safety. Patient will continue to benefit from skilled PT while here to improve functional mobility and safety.         Follow Up Recommendations Home health PT;Supervision - Intermittent    Equipment Recommendations  None recommended by PT    Recommendations for Other Services       Precautions / Restrictions Precautions Precautions: Fall Precaution Comments: history of falls Restrictions Weight Bearing Restrictions: No      Mobility  Bed Mobility Overal bed mobility: Needs Assistance Bed Mobility: Supine to Sit     Supine to sit: Min assist          Transfers Overall transfer level: Needs assistance Equipment used: Rolling walker (2 wheeled) Transfers: Sit to/from Stand Sit to Stand: Min assist            Ambulation/Gait Ambulation/Gait assistance: Min Web designer (Feet): 8 Feet Assistive device: Rolling walker (2 wheeled) Gait Pattern/deviations: Step-through pattern;Shuffle;Decreased step length - right;Decreased step length - left;Decreased  stride length Gait velocity: decr   General Gait Details: shuffle gait, min assist.  Stairs            Wheelchair Mobility    Modified Rankin (Stroke Patients Only)       Balance Overall balance assessment: Needs assistance Sitting-balance support: Feet supported Sitting balance-Leahy Scale: Good Sitting balance - Comments: able to sit unsupported at edge of bed.   Standing balance support: Bilateral upper extremity supported;During functional activity   Standing balance comment: reliant on RW and external support for safety                             Pertinent Vitals/Pain Pain Assessment: No/denies pain    Home Living Family/patient expects to be discharged to:: Private residence Living Arrangements: Other relatives Available Help at Discharge: Family             Additional Comments: patient unable to provide home info. Per admission states he lives at home with family, he reports he lives alone.    Prior Function           Comments: Unsure if patient using equipment at baseline     Hand Dominance   Dominant Hand: Right    Extremity/Trunk Assessment   Upper Extremity Assessment Upper Extremity Assessment: Overall WFL for tasks assessed    Lower Extremity Assessment Lower Extremity Assessment: Overall WFL for tasks assessed    Cervical / Trunk Assessment Cervical / Trunk Assessment: Kyphotic  Communication   Communication: No difficulties;Other (comment) (garbled speech  at times)  Cognition Arousal/Alertness: Awake/alert Behavior During Therapy: WFL for tasks assessed/performed Overall Cognitive Status: No family/caregiver present to determine baseline cognitive functioning                                 General Comments: history of cognitive impairments      General Comments      Exercises     Assessment/Plan    PT Assessment Patient needs continued PT services  PT Problem List Decreased  strength;Decreased mobility;Decreased safety awareness;Decreased activity tolerance;Decreased balance;Decreased cognition;Decreased coordination       PT Treatment Interventions DME instruction;Therapeutic activities;Gait training;Therapeutic exercise;Functional mobility training;Patient/family education    PT Goals (Current goals can be found in the Care Plan section)  Acute Rehab PT Goals Patient Stated Goal: to return home, lay in bed PT Goal Formulation: With patient Time For Goal Achievement: 11/02/20 Potential to Achieve Goals: Good    Frequency Min 3X/week   Barriers to discharge        Co-evaluation               AM-PAC PT "6 Clicks" Mobility  Outcome Measure Help needed turning from your back to your side while in a flat bed without using bedrails?: A Little Help needed moving from lying on your back to sitting on the side of a flat bed without using bedrails?: A Little Help needed moving to and from a bed to a chair (including a wheelchair)?: A Little Help needed standing up from a chair using your arms (e.g., wheelchair or bedside chair)?: A Little Help needed to walk in hospital room?: A Little Help needed climbing 3-5 steps with a railing? : A Lot 6 Click Score: 17    End of Session Equipment Utilized During Treatment: Gait belt Activity Tolerance: Patient limited by fatigue Patient left: in chair;with call bell/phone within reach;with chair alarm set;with SCD's reapplied Nurse Communication: Mobility status PT Visit Diagnosis: Unsteadiness on feet (R26.81);Muscle weakness (generalized) (M62.81);Difficulty in walking, not elsewhere classified (R26.2);History of falling (Z91.81)    Time: 2992-4268 PT Time Calculation (min) (ACUTE ONLY): 25 min   Charges:   PT Evaluation $PT Eval Moderate Complexity: 1 Mod PT Treatments $Gait Training: 8-22 mins        Irvin Lizama, PT, GCS 10/19/20,1:15 PM

## 2020-10-20 DIAGNOSIS — Z7189 Other specified counseling: Secondary | ICD-10-CM | POA: Diagnosis not present

## 2020-10-20 DIAGNOSIS — Z515 Encounter for palliative care: Secondary | ICD-10-CM | POA: Diagnosis not present

## 2020-10-20 LAB — GLUCOSE, CAPILLARY
Glucose-Capillary: 156 mg/dL — ABNORMAL HIGH (ref 70–99)
Glucose-Capillary: 108 mg/dL — ABNORMAL HIGH (ref 70–99)
Glucose-Capillary: 134 mg/dL — ABNORMAL HIGH (ref 70–99)
Glucose-Capillary: 244 mg/dL — ABNORMAL HIGH (ref 70–99)
Glucose-Capillary: 229 mg/dL — ABNORMAL HIGH (ref 70–99)
Glucose-Capillary: 228 mg/dL — ABNORMAL HIGH (ref 70–99)

## 2020-10-20 MED ORDER — LORAZEPAM 0.5 MG PO TABS: 0.5000 mg | ORAL_TABLET | ORAL | Status: DC | PRN

## 2020-10-20 MED ORDER — FUROSEMIDE 40 MG PO TABS
40.0000 mg | ORAL_TABLET | Freq: Two times a day (BID) | ORAL | Status: DC
  Administered 2020-10-20 – 2020-10-22 (×5): 40 mg via ORAL
  Filled 2020-10-20 (×5): qty 1

## 2020-10-20 MED ORDER — GLYCOPYRROLATE 1 MG PO TABS
1.0000 mg | ORAL_TABLET | Freq: Four times a day (QID) | ORAL | Status: DC | PRN
  Administered 2020-10-21: 2 mg via ORAL
  Filled 2020-10-20 (×2): qty 2

## 2020-10-20 MED ORDER — ENSURE ENLIVE PO LIQD
237.0000 mL | Freq: Two times a day (BID) | ORAL | Status: DC
  Administered 2020-10-21 – 2020-10-22 (×4): 237 mL via ORAL

## 2020-10-20 NOTE — Progress Notes (Signed)
PROGRESS NOTE  [name] [name] IRS:[id number] DOB: 1945-11-29 DOA: 10/18/2020 PCP: Libby Maw, MD  Brief History   [name] [name] is a 75 y.o. male with medical history significant of DM1, anemia of chronic disease, CKD stage V, brittle diabetes, history of CVA, dementia, aspiration pneumonia    Presented with abdominal pain in abdominal and umbilical area radiating to the legs started at breakfast associated diarrhea. He has had some worsening shortness of breath. He has not been eating well so today sister ordered him pizza. He refused to eat it so sister asked him if he want to go tot he hospital and he said yes.  No fevers or chills unsure if he has any cough.  He has had falls and since then had some back pain. Per family he started to gurgle when he is breathing he continues to cough when he is eating. Family did confirm a fall but unsure if he hit his head. At his baseline he has slurred speech. Family reports at home blood sugars 200-300s.  And family gave him some insulin 5 units prior to arrival. Patient recently admitted in November for altered mental status He has been having recurrent aspiration. During last admission refused to participate with SLP exam. Given that he will likely have recurrent aspiration he was transitioned to hospice. Patient was made DO NOT RESUSCITATE DO NOT INTUBATE.  He does have history of CKD has been followed up by outpatient nephrology who felt he is not a good dialysis candidate he is on bicarbonate and Lasix. Plan was for patient to be seen by hospice as an outpatient family states that they were not seen by hospice. They called today and left a VM.  [facility] were consulted to admit the patient for further evaluation and treatment. The patient was admitted to a telemetry bed. He is receiving lasix 80 mg IV bid. Palliative care was consulted. The patient himself was not interested in talking to them, and family could not be reached.  I  have spoken with the patient's sister [name]. Palliative care had tried to talk to her earlier and [name] was not able to get to the phone in time. The patient himself has dementia and [name] takes care of him and makes all decisions for him. She also takes care of their 15 yr old mother at her home. She wants the patient to discharge to her home with hospice. She does not wish him to have aggressive treatment for his renal disease.  Palliative care was consulted. She was able to reach the patient's son who is the patient's next of kin and responsible for making decisions for the patient. However, the son and the patient's sisters are in agreement in terms of the disposition of the patient. They are all in agreement that the patient will be discharged to home with hospice. Hospice has been consulted and will work with the patient's family to get DME set up for the patient to be discharged to home.  Consultants  . Palliative care . Hospice  Procedures  . None  Antibiotics   Anti-infectives (From admission, onward)   None     Subjective  The patient is resting comfortably. No new complaints.   Objective   Vitals:  Vitals:   10/20/20 1138 10/20/20 1514  BP: (!) 164/89 128/72  Pulse:  86  Resp:  16  Temp:  98.3 F (36.8 C)  SpO2:  97%    Exam:  Constitutional:  . The patient is awake,  alert, and oriented x 3. No acute distress. Respiratory:  . No increased work of breathing. . No wheezes or rhonchi . Positve for rales at bases bilaterally. . No tactile fremitus Cardiovascular:  . Regular rate and rhythm . No murmurs, ectopy, or gallups. . No lateral PMI. No thrills. Abdomen:  . Abdomen is soft, non-tender, non-distended . No hernias, masses, or organomegaly . Normoactive bowel sounds.  Musculoskeletal:  . No cyanosis, clubbing, or edema Skin:  . No rashes, lesions, ulcers . palpation of skin: no induration or nodules Neurologic:  . CN 2-12 intact . Sensation all 4  extremities intact Psychiatric:  . Mental status o Mood, affect appropriate o Orientation to person, place, time  . judgment and insight appear intact  I have personally reviewed the following:   Today's Data  . Vitals, Glucoses  Micro Data  . 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 CT chest  Cardiology Data  . Prolonged QTc  Scheduled Meds: . amLODipine  10 mg Oral Daily  . calcium acetate  1,334 mg Oral TID WC  . Chlorhexidine Gluconate Cloth  6 each Topical Q0600  . docusate sodium  100 mg Oral BID  . [START ON 10/21/2020] feeding supplement  237 mL Oral BID BM  . furosemide  80 mg Intravenous Q12H  . hydrALAZINE  100 mg Oral TID  . influenza vaccine adjuvanted  0.5 mL Intramuscular Tomorrow-1000  . insulin aspart  0-6 Units Subcutaneous Q4H  . insulin glargine  5 Units Subcutaneous QHS  . levothyroxine  25 mcg Oral Q0600  . mupirocin ointment  1 application Nasal BID  . sodium bicarbonate  650 mg Oral BID  . sodium chloride flush  3 mL Intravenous Q12H   Continuous Infusions: . sodium chloride      Active Problems:   Anemia of chronic disease   Acute metabolic encephalopathy   Hyperlipidemia associated with type 2 diabetes mellitus ([place])   Hypertension associated with diabetes ([place])   History of cerebrovascular accident (CVA) in adulthood   Dementia ([place])   CKD (chronic kidney disease) stage 4, GFR 15-29 ml/min ([name])   Hypothyroidism   Type 1 diabetes mellitus with hyperglycemia ([name])   Fluid overload   Prolonged QT interval   Aspiration, chronic pulmonary   Volume overload   LOS: 1 day   A & P  Fluid Overload: with Pleural effusions.  Improved on lasix 80 mg IV bid. Monitor volume status. Pt is much better. Will dc IV lasix and put the patient on lasix 40 mg bid.  Acute metabolic encephalopathy in the setting of dementia: The patient's sister [name] states that that patient is unable to make decisions for himself. However, it seems that the patient's son is  actually the next of kin and the default decision maker.  CKD IV: The patient is not a candidate for dialysis. The patient's sister wants hospice. ' Metabolic Acidosis due to CKD IV: Sodium bicarbonate is being given by mouth.   Prolonged QTc: Noted. Avoid QT prolonging medications.  DM II: The patient is receiving lantus 5 units sub Q daily with FSBS and SSI. Glucoses in the last 24 hours have run from 148 - 275.   Chronic pulmonary aspiration: Risk of aspiration/choking/asphyxia is known. The patient is still a full code. Await palliative care discussion with family.  I have seen and examined this patient myself. I have spent 34 minutes in his evaluation and care.  DVT Prophylaxis: SCD's CODE STATUS: DNR Disposition: Home  with hospice Status is: Inpatient  Remains inpatient appropriate because:Unsafe d/c plan   Dispo: The patient is from: Home              Anticipated d/c is to: Home with hospcie              Anticipated d/c date is: 2 days              Patient currently is not medically stable to d/c.  Charles Niese, DO [facility] Direct contact: see [email or web address]  7PM-7AM contact night coverage as above 10/20/2020, 6:16 PM  LOS: 0 days

## 2020-10-20 NOTE — Progress Notes (Addendum)
Daily Progress Note   Patient Name: [name] [name]       Date: 10/20/2020 DOB: 1946/06/03  Age: 75 y.o. MRN#: [id number] Attending Physician: Karie Kirks, DO Primary Care Physician: Libby Maw, MD Admit Date: 10/18/2020  Reason for Consultation/Follow-up: Establishing goals of care  Subjective: Patient is resting in bed.   Spoke with sister [name]. She states patient lives in the home with the 43 year old mother, and that they (the sisters) help both their mother and him; she states he began living there under medical advisement because of his DM. She states at home, over the past week, he has been more SOB. She states patient does not follow any particular diet including a diabetic diet. Discussed his A1C. Discussed his renal function.    Functionally, [name] states he has falls much of which is due to walking on a tile floor with socks, though they have told him not to do that. She states he has a urinal and tries to go to the bathroom, but sometimes does not make it, and one of the sisters has to give him a bath.   [name] states the sisters would be able to bring patient home with hospice and focus on keeping him at home and comfortable until he dies. She would like to let him do things on his terms for what time he has left. She states she would like [facility] hospice in case he needs be moved from the home for symptom management, he can go to the hospice facility.    Expectations at EOL were discussed.  Questions and concerns addressed.    [name] states his son is his legal NOK and there are no HPOA papers.  Spoke with son Darrius. Audley discusses patient requesting sweets and high sodium foods. He states he tries to advise the patient to eat more healthy, but states his father is  "[name] [name]" and will continue arguing to get what he wants.   We discussed his diagnoses, prognosis, GOC, EOL wishes disposition and options.  A detailed discussion was had today regarding advanced directives.  Concepts specific to code status, artifical feeding and hydration, IV antibiotics and rehospitalization were discussed.  The difference between an aggressive medical intervention path and a comfort care path was discussed.  Values and goals of care important to patient and  family were attempted to be elicited.  Discussed limitations of medical interventions to prolong quality of life in some situations and discussed the concept of human mortality.   Son states he is aware of his father's renal status and DM. He states he saw his father yesterday, and he looks very tired. He is concerned for his father's QOL moving forward, and states he would like to focus on his father's comfort and dignity for what time he has left.     Expectations at EOL were discussed.  Questions and concerns addressed.    Called [name] back to let her know the conversational outcome of patient being sent home with hospice.   I completed a MOST form today through [place] with son. A photocopy was placed in the chart to be scanned into EMR. The patient outlined their wishes for the following treatment decisions:  Cardiopulmonary Resuscitation: Do Not Attempt Resuscitation (DNR/No CPR)  Medical Interventions: Comfort Measures: Keep clean, warm, and dry. Use medication by any route, positioning, wound care, and other measures to relieve pain and suffering. Use oxygen, suction and manual treatment of airway obstruction as needed for comfort. Do not transfer to the hospital unless comfort needs cannot be met in current location.  Antibiotics: No antibiotics (use other measures to relieve symptoms)  IV Fluids: No IV fluids (provide other measures to ensure comfort)  Feeding Tube: No feeding tube    Length of Stay: 1  Current  Medications: Scheduled Meds:  . amLODipine  10 mg Oral Daily  . calcium acetate  1,334 mg Oral TID WC  . Chlorhexidine Gluconate Cloth  6 each Topical Q0600  . docusate sodium  100 mg Oral BID  . furosemide  80 mg Intravenous Q12H  . hydrALAZINE  100 mg Oral TID  . influenza vaccine adjuvanted  0.5 mL Intramuscular Tomorrow-1000  . insulin aspart  0-6 Units Subcutaneous Q4H  . insulin glargine  5 Units Subcutaneous QHS  . levothyroxine  25 mcg Oral Q0600  . mupirocin ointment  1 application Nasal BID  . sodium bicarbonate  650 mg Oral BID  . sodium chloride flush  3 mL Intravenous Q12H    Continuous Infusions: . sodium chloride      PRN Meds: sodium chloride, acetaminophen **OR** acetaminophen, albuterol, HYDROcodone-acetaminophen, polyethylene glycol, sodium chloride flush  Physical Exam Pulmonary:     Effort: Pulmonary effort is normal.  Neurological:     Mental Status: He is alert.             Vital Signs: BP (!) 158/81 (BP Location: Right Arm)   Pulse 84   Temp 98.5 F (36.9 C) (Oral)   Resp 20   Ht _0  (1.753 m)   Wt 73.5 kg   SpO2 99%   BMI 23.93 kg/m  SpO2: SpO2: 99 % O2 Device: O2 Device: Room Air O2 Flow Rate:    Intake/output summary:   Intake/Output Summary (Last 24 hours) at 10/20/2020 1125 Last data filed at 10/20/2020 1115 Gross per 24 hour  Intake 1377 ml  Output 2050 ml  Net -673 ml   LBM: Last BM Date: 10/19/20 Baseline Weight: Weight: 73.5 kg Most recent weight: Weight: 73.5 kg       Patient Active Problem List   Diagnosis Date Noted  . Aspiration, chronic pulmonary 10/19/2020  . Volume overload 10/19/2020  . Fluid overload 10/18/2020  . Prolonged QT interval 10/18/2020  . Palliative care by specialist   . Acute encephalopathy 08/18/2020  . Aspiration  pneumonia of right lower lobe ([place]) 08/18/2020  . AKI (acute kidney injury) ([place]) 08/15/2020  . Type 1 diabetes mellitus with hyperglycemia ([place]) 07/27/2020  . Type 1 diabetes  mellitus with stage 4 chronic kidney disease ([place]) 07/27/2020  . Diabetes mellitus type I ([place]) 07/27/2020  . Thrombocytopenia ([place]) 04/23/2020  . Hypothyroidism 04/22/2020  . DKA (diabetic ketoacidoses) 04/14/2020  . PVD (peripheral vascular disease) ([place]) 02/02/2020  . Poor social situation 12/08/2019  . CKD (chronic kidney disease) stage 4, GFR 15-29 ml/min ([name]) 12/08/2019  . Cognitive decline 11/22/2019  . ETOH abuse 05/17/2019  . Chronic diastolic CHF (congestive heart failure) ([place]) 05/17/2019  . Uncontrolled type 2 diabetes mellitus with hyperglycemia, with long-term current use of insulin ([place]) 10/03/2018  . Underweight 11/18/2017  . Anemia 11/18/2017  . History of cerebrovascular accident (CVA) in adulthood 07/25/2017  . Depression with anxiety 07/25/2017  . Left ventricular diastolic dysfunction 43/15/4008  . Dementia ([place]) 07/25/2017  . Brittle diabetes mellitus ([place]) 06/28/2017  . Hyperlipidemia associated with type 2 diabetes mellitus ([place]) 06/27/2017  . Hypertension associated with diabetes ([place]) 06/27/2017  . Acute metabolic encephalopathy 67/61/9509  . Weakness 11/16/2016  . CKD (chronic kidney disease), stage V ([place])   . Noncompliance with medication regimen 08/17/2016  . History of DVT (deep vein thrombosis) 07/06/2016  . Vitamin B 12 deficiency 06/18/2016  . Hyperglycemia 05/15/2016  . Hyponatremia 05/15/2016  . Anxiety 05/15/2016  . Narcotic dependency, continuous ([place]) 11/02/2015  . Uncontrolled type 2 diabetes mellitus with diabetic nephropathy, with long-term current use of insulin ([place]) 08/12/2015  . Anemia of chronic disease 02/02/2013    Palliative Care Assessment & Plan    Recommendations/Plan:  Home with hospice. Family wants [facility] so that if symptoms are such that he needs to be relocated from the home, he can go to the hospice facility.    Code Status:    Code Status Orders  (From admission, onward)         Start     Ordered    10/20/20 1124  Do not attempt resuscitation (DNR)  Continuous       Question Answer Comment  In the event of cardiac or respiratory ARREST Do not call a "code blue"   In the event of cardiac or respiratory ARREST Do not perform Intubation, CPR, defibrillation or ACLS   In the event of cardiac or respiratory ARREST Use medication by any route, position, wound care, and other measures to relive pain and suffering. May use oxygen, suction and manual treatment of airway obstruction as needed for comfort.   Comments MOST form in [place]      10/20/20 1123        Code Status History    Date Active Date Inactive Code Status Order ID Comments User Context   10/19/2020 0014 10/20/2020 1123 Full Code [id number]  Toy Baker, MD Inpatient   08/19/2020 0003 08/20/2020 2132 DNR [id number]  Vianne Bulls, MD ED   08/14/2020 2205 08/18/2020 2054 DNR [id number]  Shela Leff, MD ED   04/22/2020 2306 04/28/2020 1723 DNR [id number]  Lenore Cordia, MD ED   04/14/2020 2159 04/20/2020 1933 Full Code [id number]  Donnamae Jude, MD Inpatient   03/24/2020 1602 03/29/2020 1925 Full Code [id number]  Gifford Shave, MD ED   03/24/2020 1602 03/24/2020 1602 Full Code [id number]  Gifford Shave, MD ED   11/28/2019 1418 12/04/2019 2152 Full Code [id number]  Norval Morton, MD ED   11/22/2019 1847 11/25/2019 2040 Full Code  [id number]  Guilford Shi, MD ED   05/17/2019 0141 05/17/2019 2341 Full Code [id number]  Toy Baker, MD Inpatient   01/27/2019 1641 02/01/2019 1846 Full Code [id number]  [name], DO ED   01/27/2019 1557 01/27/2019 1641 Full Code [id number]  [name], DO ED   10/03/2018 0353 10/04/2018 2027 Full Code [id number]  Norval Morton, MD ED   11/17/2017 0006 11/19/2017 2317 Full Code [id number]  Etta Quill, DO ED   08/01/2017 1758 08/13/2017 2237 Full Code [id number]  Magdalen Spatz, NP ED   07/25/2017 1359 07/28/2017 1641 Full Code [id number]  Samella Parr, NP ED   06/27/2017 1619 06/30/2017 2006  Full Code [id number]  Samella Parr, NP Inpatient   05/03/2017 1232 05/08/2017 1855 Full Code [id number]  Benito Mccreedy, MD Inpatient   05/03/2017 1045 05/03/2017 1232 DNR [id number]  Benito Mccreedy, MD ED   03/22/2017 2351 03/31/2017 1546 DNR [id number]  Karmen Bongo, MD Inpatient   01/30/2017 0302 02/01/2017 1911 Full Code [id number]  Gardiner Barefoot, NP Inpatient   01/29/2017 2222 01/30/2017 0302 Full Code [id number]  Karmen Bongo, MD Inpatient   01/16/2017 2209 01/20/2017 1843 Full Code [id number]  Toy Baker, MD Inpatient   12/28/2016 1712 12/30/2016 1924 Full Code [id number]  Hosie Poisson, MD ED   12/14/2016 0038 12/17/2016 2110 Full Code [id number]  Vianne Bulls, MD ED   11/16/2016 1117 11/20/2016 2230 Full Code [id number]  Radene Gunning, NP ED   10/01/2016 1828 10/07/2016 1953 Full Code [id number]  Omar Person, NP ED   08/17/2016 1438 08/19/2016 2216 Full Code [id number]  Maren Reamer, MD ED   07/06/2016 0130 07/07/2016 2243 Full Code [id number]  Lily Kocher, MD Inpatient   06/15/2016 1007 06/21/2016 1921 Full Code [id number]  Tawni Millers, MD Inpatient   05/15/2016 2135 05/17/2016 1653 Full Code [id number]  Vianne Bulls, MD ED   05/09/2016 0252 05/12/2016 1716 Full Code [id number]  Roney Jaffe, MD Inpatient   11/01/2015 1837 11/03/2015 1708 Full Code [id number]  Robbie Lis, MD Inpatient   09/26/2015 0012 09/27/2015 1542 Full Code [id number]  Etta Quill, DO ED   08/21/2015 1725 08/24/2015 2120 Full Code [id number]  Domenic Polite, MD Inpatient   08/12/2015 1637 08/14/2015 1445 Full Code [id number]  Robbie Lis, MD ED   08/08/2015 0037 08/10/2015 1830 Full Code [id number]  Allyne Gee, MD ED   07/05/2015 0921 07/09/2015 1635 Full Code [id number]  Chesley Mires, MD ED   07/02/2015 1836 07/04/2015 2236 Full Code [id number]  Marshell Garfinkel, MD ED   03/20/2015 1106 03/23/2015 1550 Full Code [id number]  Allie Bossier, MD ED   03/20/2015 0032 03/20/2015 1106 Full Code [id number]   Lavina Hamman, MD ED   03/19/2014 2157 03/22/2014 1232 Full Code [id number]  Rise Patience, MD Inpatient   01/21/2014 1506 01/25/2014 1212 Full Code [id number]  Eugenie Filler, MD ED   07/03/2013 2050 07/05/2013 1609 Full Code [id number]  Theodis Blaze, MD ED   05/29/2013 2249 05/31/2013 1602 Full Code [id number]  Merton Border, MD Inpatient   02/05/2013 1919 02/09/2013 1740 Full Code [id number]  [name], MD Inpatient   02/02/2013 2057 02/05/2013 1919 Full Code [id number]  Isaac Bliss, Rayford Halsted, MD ED   Advance Care Planning Activity       Prognosis:   < 2 weeks DM, renal failure.      Care plan was discussed via epic chat with primary MD and TOC.  Thank you for allowing the Palliative Medicine Team to assist in the care of this patient.   Time In: 10:30 Time Out: 11:40 Total Time 70 min Prolonged Time Billed  yes      Greater than 50%  of this time was spent counseling and coordinating care related to the above assessment and plan.  Asencion Gowda, NP  Please contact Palliative Medicine Team phone at [phone] for questions and concerns.

## 2020-10-20 NOTE — Progress Notes (Signed)
[facility])  Referral received for hospice services at home.  Contacted his sister and caregiver [name]. Discussed d/c home with hospice, she advised me that they cannot care for him until DME is in place and that would not be today.  His sisters [name] and [name] care for him and their 76 year old mother. They have to arrange someone to come remove current furniture so they hospital bed can be delivered.  DME discussed, will order hospital bed.  Updated TOC manager and attending via [place].   Venia Carbon RN, BSN, [facility] Liaison

## 2020-10-20 NOTE — Progress Notes (Signed)
Pt alert and oriented, no pain or shortness of breath. Improving with lasix. Had a large stool accident, condom cath still in place with good urine output. Seems to have more of an appetite today, even asked for Ensure, which were then ordered by MD. Vitals stable. Pt was told he needs to be careful with aspirating food, eats in improper positions and then coughs. Very cooperative with all staff.

## 2020-10-20 NOTE — TOC Progression Note (Signed)
Transition of Care [facility]) - Progression Note    Patient Details  Name: [name] [name] MRN: [id number] Date of Birth: [birth year]  Transition of Care [facility]) CM/SW Contact  Purcell Mouton, RN Phone Number: 10/20/2020, 12:21 PM  Clinical Narrative:     Spoke with pt's sister [name] [phone] concerning discharge home with hospice. [facility] was selected, referral was given to [place], Therapist, sports. [name] asked for hospital bed.        Expected Discharge Plan and Services                                                 Social Determinants of Health (SDOH) Interventions    Readmission Risk Interventions Readmission Risk Prevention Plan 04/20/2020  Transportation Screening Complete  Medication Review [employer]) Complete  SW Recovery Care/Counseling Consult Complete  Palliative Care Screening Complete  Skilled Nursing Facility Complete  Some recent data might be hidden

## 2020-10-21 DIAGNOSIS — T17908A Unspecified foreign body in respiratory tract, part unspecified causing other injury, initial encounter: Secondary | ICD-10-CM | POA: Diagnosis not present

## 2020-10-21 DIAGNOSIS — N184 Chronic kidney disease, stage 4 (severe): Secondary | ICD-10-CM | POA: Diagnosis not present

## 2020-10-21 DIAGNOSIS — D638 Anemia in other chronic diseases classified elsewhere: Secondary | ICD-10-CM | POA: Diagnosis not present

## 2020-10-21 DIAGNOSIS — G9341 Metabolic encephalopathy: Secondary | ICD-10-CM | POA: Diagnosis not present

## 2020-10-21 LAB — GLUCOSE, CAPILLARY
Glucose-Capillary: 162 mg/dL — ABNORMAL HIGH (ref 70–99)
Glucose-Capillary: 170 mg/dL — ABNORMAL HIGH (ref 70–99)
Glucose-Capillary: 181 mg/dL — ABNORMAL HIGH (ref 70–99)
Glucose-Capillary: 186 mg/dL — ABNORMAL HIGH (ref 70–99)
Glucose-Capillary: 203 mg/dL — ABNORMAL HIGH (ref 70–99)
Glucose-Capillary: 263 mg/dL — ABNORMAL HIGH (ref 70–99)

## 2020-10-21 NOTE — Progress Notes (Signed)
PROGRESS NOTE  [name] [name]:[id number] DOB: 17-Jul-1946 DOA: 10/18/2020 PCP: Libby Maw, MD  Brief History   [name] [name] is a 75 y.o. male with medical history significant of DM1, anemia of chronic disease, CKD stage V, brittle diabetes, history of CVA, dementia, aspiration pneumonia    Presented with abdominal pain in abdominal and umbilical area radiating to the legs started at breakfast associated diarrhea. He has had some worsening shortness of breath. He has not been eating well so today sister ordered him pizza. He refused to eat it so sister asked him if he want to go tot he hospital and he said yes.  No fevers or chills unsure if he has any cough.  He has had falls and since then had some back pain. Per family he started to gurgle when he is breathing he continues to cough when he is eating. Family did confirm a fall but unsure if he hit his head. At his baseline he has slurred speech. Family reports at home blood sugars 200-300s.  And family gave him some insulin 5 units prior to arrival. Patient recently admitted in November for altered mental status He has been having recurrent aspiration. During last admission refused to participate with SLP exam. Given that he will likely have recurrent aspiration he was transitioned to hospice. Patient was made DO NOT RESUSCITATE DO NOT INTUBATE.  He does have history of CKD has been followed up by outpatient nephrology who felt he is not a good dialysis candidate he is on bicarbonate and Lasix. Plan was for patient to be seen by hospice as an outpatient family states that they were not seen by hospice. They called today and left a VM.  [facility] were consulted to admit the patient for further evaluation and treatment. The patient was admitted to a telemetry bed. He is receiving lasix 80 mg IV bid. Palliative care was consulted. The patient himself was not interested in talking to them, and family could not be reached.  I  have spoken with the patient's sister [name] [name]. Palliative care had tried to talk to her earlier and [name] [name] was not able to get to the phone in time. The patient himself has dementia and [name] [name] takes care of him and makes all decisions for him. She also takes care of their 61 yr old mother at her home. She wants the patient to discharge to her home with hospice. She does not wish him to have aggressive treatment for his renal disease.  Palliative care was consulted. She was able to reach the patient's son who is the patient's next of kin and responsible for making decisions for the patient. However, the son and the patient's sisters are in agreement in terms of the disposition of the patient. They are all in agreement that the patient will be discharged to home with hospice. Hospice has been consulted and will work with the patient's family to get DME set up for the patient to be discharged to home.  Consultants  . Palliative care . Hospice  Procedures  . None  Antibiotics   Anti-infectives (From admission, onward)   None     Subjective  The patient is resting comfortably. No new complaints.   Objective   Vitals:  Vitals:   10/21/20 0435 10/21/20 1314  BP: (!) 165/78 (!) 148/80  Pulse: 83 79  Resp: 16 18  Temp: 98.3 F (36.8 C) 98.1 F (36.7 C)  SpO2: 99% 98%    Exam:  Constitutional:  .  The patient is awake, alert, and oriented x 3. No acute distress. Respiratory:  . No increased work of breathing. . No wheezes or rhonchi . Positve for rales at bases bilaterally. . No tactile fremitus Cardiovascular:  . Regular rate and rhythm . No murmurs, ectopy, or gallups. . No lateral PMI. No thrills. Abdomen:  . Abdomen is soft, non-tender, non-distended . No hernias, masses, or organomegaly . Normoactive bowel sounds.  Musculoskeletal:  . No cyanosis, clubbing, or edema Skin:  . No rashes, lesions, ulcers . palpation of skin: no induration or nodules Neurologic:  . CN 2-12  intact . Sensation all 4 extremities intact Psychiatric:  . Mental status o Mood, affect appropriate o Orientation to person, place, time  . judgment and insight appear intact  I have personally reviewed the following:   Today's Data  . Vitals, Glucoses  Micro Data  . 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 CT chest  Cardiology Data  . Prolonged QTc  Scheduled Meds: . amLODipine  10 mg Oral Daily  . calcium acetate  1,334 mg Oral TID WC  . Chlorhexidine Gluconate Cloth  6 each Topical Q0600  . docusate sodium  100 mg Oral BID  . feeding supplement  237 mL Oral BID BM  . furosemide  40 mg Oral BID  . hydrALAZINE  100 mg Oral TID  . influenza vaccine adjuvanted  0.5 mL Intramuscular Tomorrow-1000  . insulin aspart  0-6 Units Subcutaneous Q4H  . insulin glargine  5 Units Subcutaneous QHS  . levothyroxine  25 mcg Oral Q0600  . mupirocin ointment  1 application Nasal BID  . sodium bicarbonate  650 mg Oral BID  . sodium chloride flush  3 mL Intravenous Q12H   Continuous Infusions: . sodium chloride      Active Problems:   Anemia of chronic disease   Acute metabolic encephalopathy   Hyperlipidemia associated with type 2 diabetes mellitus ([place])   Hypertension associated with diabetes ([place])   History of cerebrovascular accident (CVA) in adulthood   Dementia ([place])   CKD (chronic kidney disease) stage 4, GFR 15-29 ml/min ([name])   Hypothyroidism   Type 1 diabetes mellitus with hyperglycemia ([name])   Fluid overload   Prolonged QT interval   Aspiration, chronic pulmonary   Volume overload   LOS: 2 days   A & P  Fluid Overload: with Pleural effusions.  Improved on lasix 80 mg IV bid. Monitor volume status. Pt is much better. Will dc IV lasix and put the patient on lasix 40 mg bid.  Acute metabolic encephalopathy in the setting of dementia: The patient's sister [name] [name] states that that patient is unable to make decisions for himself. However, it seems that the patient's son is  actually the next of kin and the default decision maker.  CKD IV: The patient is not a candidate for dialysis. The patient's sister wants hospice. ' Metabolic Acidosis due to CKD IV: Sodium bicarbonate is being given by mouth.   Prolonged QTc: Noted. Avoid QT prolonging medications.  DM II: The patient is receiving lantus 5 units sub Q daily with FSBS and SSI. Glucoses in the last 24 hours have run from 148 - 275.   Chronic pulmonary aspiration: Risk of aspiration/choking/asphyxia is known. The patient is still a full code. Await palliative care discussion with family.  I have seen and examined this patient myself. I have spent 34 minutes in his evaluation and care.  DVT Prophylaxis: SCD's CODE STATUS: DNR Disposition:  Home with hospice Status is: Inpatient  Remains inpatient appropriate because:Unsafe d/c plan   Dispo: The patient is from: Home              Anticipated d/c is to: Home with hospcie              Anticipated d/c date is: 2 days              Patient currently is not medically stable to d/c.  Kriston Mckinnie, DO [facility] Direct contact: see [email or web address]  7PM-7AM contact night coverage as above 10/21/2020, 5:02 PM  LOS: 0 days

## 2020-10-21 NOTE — Plan of Care (Signed)

## 2020-10-21 NOTE — Progress Notes (Signed)
[facility])  Referral received for hospice services at home.  Contacted his sister and caregiver [name]. Discussed d/c home with hospice and [name] would like patient to have a afternoon discharge once DME is in place. DME discussed, I have ordered the hospital bed for delivery tomorrow around lunch. [name] is aware.   His sisters [name] and [name] care for him and their 76 year old mother. They have to arrange someone to come remove current furniture so they hospital bed can be delivered.  Thank you, Clementeen Hoof, BSN, [facility] [phone]

## 2020-10-22 ENCOUNTER — Encounter (HOSPITAL_COMMUNITY): Payer: Self-pay | Admitting: Internal Medicine

## 2020-10-22 ENCOUNTER — Telehealth: Payer: Self-pay | Admitting: Family Medicine

## 2020-10-22 DIAGNOSIS — N184 Chronic kidney disease, stage 4 (severe): Secondary | ICD-10-CM | POA: Diagnosis not present

## 2020-10-22 DIAGNOSIS — G9341 Metabolic encephalopathy: Secondary | ICD-10-CM | POA: Diagnosis not present

## 2020-10-22 DIAGNOSIS — D638 Anemia in other chronic diseases classified elsewhere: Secondary | ICD-10-CM | POA: Diagnosis not present

## 2020-10-22 DIAGNOSIS — T17908A Unspecified foreign body in respiratory tract, part unspecified causing other injury, initial encounter: Secondary | ICD-10-CM | POA: Diagnosis not present

## 2020-10-22 LAB — GLUCOSE, CAPILLARY
Glucose-Capillary: 194 mg/dL — ABNORMAL HIGH (ref 70–99)
Glucose-Capillary: 195 mg/dL — ABNORMAL HIGH (ref 70–99)
Glucose-Capillary: 197 mg/dL — ABNORMAL HIGH (ref 70–99)
Glucose-Capillary: 276 mg/dL — ABNORMAL HIGH (ref 70–99)
Glucose-Capillary: 320 mg/dL — ABNORMAL HIGH (ref 70–99)

## 2020-10-22 MED ORDER — PROCHLORPERAZINE MALEATE 10 MG PO TABS: 10.0000 mg | ORAL_TABLET | ORAL | 0 refills | Status: DC | PRN

## 2020-10-22 MED ORDER — ENSURE ENLIVE PO LIQD: 237.0000 mL | Freq: Two times a day (BID) | ORAL | 12 refills | Status: DC

## 2020-10-22 MED ORDER — HYOSCYAMINE SULFATE 0.125 MG SL SUBL: 0.1250 mg | SUBLINGUAL_TABLET | SUBLINGUAL | 0 refills | Status: AC | PRN

## 2020-10-22 MED ORDER — DOCUSATE SODIUM 100 MG PO CAPS: 100.0000 mg | ORAL_CAPSULE | Freq: Two times a day (BID) | ORAL | 0 refills | Status: DC

## 2020-10-22 MED ORDER — OXYCODONE HCL 5 MG PO TABS: 5.0000 mg | ORAL_TABLET | ORAL | 0 refills | Status: AC | PRN

## 2020-10-22 MED ORDER — CALCIUM ACETATE (PHOS BINDER) 667 MG PO CAPS: 1334.0000 mg | ORAL_CAPSULE | Freq: Three times a day (TID) | ORAL | 0 refills | Status: DC

## 2020-10-22 MED ORDER — LORAZEPAM 0.5 MG PO TABS: 0.5000 mg | ORAL_TABLET | ORAL | 0 refills | Status: DC | PRN

## 2020-10-22 NOTE — TOC Progression Note (Signed)
Transition of Care [facility]) - Progression Note    Patient Details  Name: [name] [name] MRN: [id number] Date of Birth: 1946/03/09  [facility]) CM/SW Contact  Jacalyn Lefevre Edson Snowball, RN Phone Number: 10/22/2020, 1:39 PM  Clinical Narrative:     Spoke to patient's sister [name] [name]. She has received DME ordered by [facility] and ready for patient to discharge to home with hospice. [place] aware. NCM confirmed address [name] [name] requesting [facility] transport home.   NCM will leave paperwork in patient's chart. Nurse requesting pick in a hour.   Expected Discharge Plan: Home w Hospice Care Barriers to Discharge: No Barriers Identified  Expected Discharge Plan and Services Expected Discharge Plan: [place]   Discharge Planning Services: CM Consult Post Acute Care Choice: Hospice                                         Social Determinants of Health (SDOH) Interventions    Readmission Risk Interventions Readmission Risk Prevention Plan 04/20/2020  Transportation Screening Complete  Medication Review [employer]) Complete  SW Recovery Care/Counseling Consult Complete  Palliative Care Screening Complete  Skilled Nursing Facility Complete  Some recent data might be hidden

## 2020-10-22 NOTE — Progress Notes (Signed)
OT Cancellation Note  Patient Details Name: [name] [name] MRN: [id number] DOB: [birth year]   Cancelled Treatment:    Reason Eval/Treat Not Completed: Patient declined, no reason specified;Other (comment) pt declined OT session stating , " I don't want to be bothered." Noted per chart review pt likely to DC home with hospice, will continue to follow pt acutely as pt medically appropriate.   Corinne Ports K., COTA/L Acute Rehabilitation Services [phone]   Precious Haws 10/22/2020, 2:49 PM

## 2020-10-22 NOTE — Telephone Encounter (Signed)
[name] [name] is off tomorrow - please call Tammy w/[facility] asap to notify if provider will be attending. Ph # for Tammy [phone].

## 2020-10-22 NOTE — Telephone Encounter (Signed)
Horris Latino from [facility] is calling to see if they could get orders for Hospice to come out to patients home. He's being released today. They also want to know who would be his attending. Please call her at  [phone].

## 2020-10-22 NOTE — Discharge Summary (Signed)
Physician Discharge Summary  [name] [name] PPJ:[id number] DOB: [birth year] DOA: 10/18/2020  PCP: Libby Maw, MD  Admit date: 10/18/2020 Discharge date: 10/22/2020  Recommendations for Outpatient Follow-up:  1. Discharge to home with hospice for end of life care.  Discharge Diagnoses: Principal diagnosis is #1 1. Fluid overload with pleural effusions 2. Acute metabolic encephalopathy in the setting of dementia 3. CKD IV 4. Metabolic acidosis due to CKD IV 5. Prolonged QTc 6. DM II 7. Chronic pulmonary aspiration  Discharge Condition: Fair  Disposition: Home with hospice  Diet recommendation: Heart healthy with modified carbohydrates  Filed Weights   10/18/20 1456  Weight: 73.5 kg    History of present illness: [name] [name] is a 75 y.o. male with medical history significant of DM1, anemia of chronic disease, CKD stage V, brittle diabetes, history of CVA, dementia, aspiration pneumonia    Presented with abdominal pain in abdominal and umbilical area radiating to the legs started at breakfast associated diarrhea. He has had some worsening shortness of breath. He has not been eating well so today sister ordered him pizza. He refused to eat it so sister asked him if he want to go tot he hospital and he said yes.  No fevers or chills unsure if he has any cough. He has had falls and since then had some back pain. Per family he started to gurgle when he is breathing he continues to cough when he is eating. Family did confirm a fall but unsure if he hit his head. At his baseline he has slurred speech. Family reports at home blood sugars 200-300s.  And family gave him some insulin 5 units prior to arrival. Patient recently admitted in November for altered mental status He has been having recurrent aspiration During last admission refused to participate with SLP exam. Given that he will likely have recurrent aspiration he was transitioned to hospice Patient was made DO NOT  RESUSCITATE DO NOT INTUBATE  He does have history of CKD has been followed up by outpatient nephrology who felt he is not a good dialysis candidate he is on bicarbonate and Lasix Plan was for patient to be seen by hospice as an outpatient family states that they were not seen by hospice.  They called today and left a VM.  Hospital Course:  [facility] hospitalists were consulted to admit the patient for further evaluation and treatment. The patient was admitted to a telemetry bed. He is receiving lasix 80 mg IV bid. Palliative care was consulted. The patient himself was not interested in talking to them, and family could not be reached.  I have spoken with the patient's sister [name]. Palliative care had tried to talk to her earlier and [name] was not able to get to the phone in time. The patient himself has dementia and [name] takes care of him and makes all decisions for him. She also takes care of their 17 yr old mother at her home. She wants the patient to discharge to her home with hospice. She does not wish him to have aggressive treatment for his renal disease.  Palliative care was consulted. She was able to reach the patient's son who is the patient's next of kin and responsible for making decisions for the patient. However, the son and the patient's sisters are in agreement in terms of the disposition of the patient. They are all in agreement that the patient will be discharged to home with hospice. Hospice has been consulted and will work with the patient's family  to get DME set up for the patient to be discharged to home.  DME has arrived at the home, and the patient will discharge to home with hospice.  Today's assessment: S: The patient is resting comfortably. No new complaints. O: Vitals:  Vitals:   10/22/20 0400 10/22/20 1513  BP: 132/81 (!) 141/69  Pulse: 90 88  Resp: 18   Temp: 98.8 F (37.1 C) 98.3 F (36.8 C)  SpO2: 96% 95%  Exam:  Constitutional:   The patient is awake,  alert, and oriented x 3. No acute distress. Respiratory:   No increased work of breathing.  No wheezes or rhonchi  Positve for rales at bases bilaterally.  No tactile fremitus Cardiovascular:   Regular rate and rhythm  No murmurs, ectopy, or gallups.  No lateral PMI. No thrills. Abdomen:   Abdomen is soft, non-tender, non-distended  No hernias, masses, or organomegaly  Normoactive bowel sounds.  Musculoskeletal:   No cyanosis, clubbing, or edema Skin:   No rashes, lesions, ulcers  palpation of skin: no induration or nodules Neurologic:   CN 2-12 intact  Sensation all 4 extremities intact  Discharge Instructions  Discharge Instructions    Call MD for:  difficulty breathing, headache or visual disturbances   Complete by: As directed    Call MD for:  severe uncontrolled pain   Complete by: As directed    Diet - low sodium heart healthy   Complete by: As directed    Diet Carb Modified   Complete by: As directed    Discharge instructions   Complete by: As directed    Discharge to home with hospice for end of life care.   Increase activity slowly   Complete by: As directed      Allergies as of 10/22/2020   No Known Allergies     Medication List    STOP taking these medications   atorvastatin 40 MG tablet Commonly known as: LIPITOR     TAKE these medications   Accu-Chek Guide w/Device Kit 1 Device by Does not apply route QID.   acetaminophen 325 MG tablet Commonly known as: Tylenol Take 2 tablets (650 mg total) by mouth every 6 (six) hours as needed for up to 30 doses for mild pain or moderate pain.   amLODipine 10 MG tablet Commonly known as: NORVASC Take 1 tablet (10 mg total) by mouth daily.   Basaglar KwikPen 100 UNIT/ML Inject 5 Units into the skin at bedtime.   calcium acetate 667 MG capsule Commonly known as: PHOSLO Take 2 capsules (1,334 mg total) by mouth 3 (three) times daily with meals.   cyanocobalamin 1000 MCG tablet Take 1  tablet (1,000 mcg total) by mouth daily.   docusate sodium 100 MG capsule Commonly known as: COLACE Take 1 capsule (100 mg total) by mouth 2 (two) times daily.   feeding supplement Liqd Take 237 mLs by mouth 2 (two) times daily between meals. Start taking on: October 23, 2020   furosemide 40 MG tablet Commonly known as: LASIX Take 40 mg by mouth 2 (two) times daily.   glucose blood test strip Commonly known as: True Metrix Blood Glucose Test CHECK BLOOD SUGAR UP TO 4 TIMES A DAY. What changed:   how much to take  how to take this  when to take this  additional instructions   glucose blood test strip Commonly known as: Accu-Chek Guide Use as instructed What changed: Another medication with the same name was changed. Make sure you understand  how and when to take each.   Accu-Chek Guide test strip Generic drug: glucose blood USE AS DIRECTED What changed: Another medication with the same name was changed. Make sure you understand how and when to take each.   hydrALAZINE 100 MG tablet Commonly known as: APRESOLINE TAKE 1 TABLET BY MOUTH 3 TIMES A DAY.   hyoscyamine 0.125 MG SL tablet Commonly known as: LEVSIN SL Place 1 tablet (0.125 mg total) under the tongue every 4 (four) hours as needed (excess oral secretions).   insulin lispro 100 UNIT/ML KwikPen Commonly known as: HumaLOG KwikPen Inject 0-10 Units into the skin 3 (three) times daily as needed (blood sugar). Max daily 30 units   Insulin Pen Needle 32G X 4 MM Misc 1 Device by Does not apply route in the morning, at noon, in the evening, and at bedtime.   Insulin Syringe-Needle U-100 31G X 5/16" 0.3 ML Misc Commonly known as: BD Insulin Syringe U/F Inject 1 each as directed 2 (two) times daily. as directed   levothyroxine 25 MCG tablet Commonly known as: SYNTHROID TAKE 1 TABLET EACH DAY. What changed: See the new instructions.   LORazepam 0.5 MG tablet Commonly known as: ATIVAN Take 1 tablet (0.5 mg  total) by mouth every 4 (four) hours as needed for anxiety. May crush, mix with water and give sublingually if needed.   oxyCODONE 5 MG immediate release tablet Commonly known as: Oxy IR/ROXICODONE Take 1 tablet (5 mg total) by mouth every 4 (four) hours as needed for severe pain. May crush, mix with water and give sublingually if needed.   prochlorperazine 10 MG tablet Commonly known as: COMPAZINE Take 1 tablet (10 mg total) by mouth every 4 (four) hours as needed for nausea or vomiting. May crush, mix with water and give sublingually.   sodium bicarbonate 650 MG tablet Take 1 tablet (650 mg total) by mouth 2 (two) times daily.        No Known Allergies  The results of significant diagnostics from this hospitalization (including imaging, microbiology, ancillary and laboratory) are listed below for reference.    Significant Diagnostic Studies: CT ABDOMEN PELVIS WO CONTRAST  Result Date: 10/18/2020 CLINICAL DATA:  Abdominal pain.  Back pain. EXAM: CT CHEST, ABDOMEN AND PELVIS WITHOUT CONTRAST CT LUMBAR SPINE WITHOUT CONTRAST TECHNIQUE: Multidetector CT imaging of the chest, abdomen and pelvis was performed following the standard protocol without IV contrast. Multiplanar CT images of the lumbar spine were reconstructed from contemporary CT of the Chest, Abdomen, and Pelvis COMPARISON:  07/06/2020 FINDINGS: CT CHEST FINDINGS Cardiovascular: The heart size is mildly enlarged. There are advanced coronary artery calcifications. There are atherosclerotic changes of the thoracic aorta without evidence for an aneurysm. The intracardiac blood pool is hypodense relative to the adjacent myocardium consistent with anemia. There is no large pericardial effusion. Mediastinum/Nodes: -- No mediastinal lymphadenopathy. -- No hilar lymphadenopathy. -- No axillary lymphadenopathy. -- No supraclavicular lymphadenopathy. -- Normal thyroid gland where visualized. -  Unremarkable esophagus. Lungs/Pleura: There are  moderate to large bilateral pleural effusions, new since prior study. There is adjacent compressive atelectasis. There is no pneumothorax. The trachea is unremarkable. Musculoskeletal: Again identified are multilevel compression fractures of the thoracic spine. These fractures all appear to be essentially unchanged since the prior study in September. There are multiple healing right-sided rib fractures. There is a questionable nondisplaced fracture involving the posterior eleventh rib on the left. CT ABDOMEN PELVIS FINDINGS Hepatobiliary: The liver is normal. Normal gallbladder.There is no biliary ductal dilation.  Pancreas: Normal contours without ductal dilatation. No peripancreatic fluid collection. Spleen: Unremarkable. Adrenals/Urinary Tract: --Adrenal glands: Unremarkable. --Right kidney/ureter: No hydronephrosis or radiopaque kidney stones. --Left kidney/ureter: No hydronephrosis or radiopaque kidney stones. --Urinary bladder: Unremarkable. Stomach/Bowel: --Stomach/Duodenum: No hiatal hernia or other gastric abnormality. Normal duodenal course and caliber. --Small bowel: Unremarkable. --Colon: Unremarkable. --Appendix: Not visualized. No right lower quadrant inflammation or free fluid. Vascular/Lymphatic: Atherosclerotic calcification is present within the non-aneurysmal abdominal aorta, without hemodynamically significant stenosis. --No retroperitoneal lymphadenopathy. --No mesenteric lymphadenopathy. --No pelvic or inguinal lymphadenopathy. Reproductive: Unremarkable Other: No ascites or free air. There is mild diffuse body wall edema. Musculoskeletal. An old L1 compression fractures again noted. There is advanced disc height loss at the L4-L5 and L5-S1 levels. There is no definite acute displaced fracture. The patient is status post prior total hip arthroplasty on the right. The patient is status post prior intramedullary nail placement on the left. IMPRESSION: 1. Moderate to large bilateral pleural  effusions with adjacent compressive atelectasis, new since prior study. 2. No acute intra-abdominal or pelvic pathology. 3. Multiple healing right-sided rib fractures. Questionable nondisplaced fracture involving the posterior eleventh rib on the left. 4. Chronic compression fractures are noted throughout the thoracolumbar spine. No new compression fracture was identified on today's study. 5. Anasarca. 6. Anemia. Aortic Atherosclerosis (ICD10-I70.0). Electronically Signed   By: Constance Holster M.D.   On: 10/18/2020 18:27   CT Head Wo Contrast  Result Date: 10/18/2020 CLINICAL DATA:  75 year old male with head trauma. EXAM: CT HEAD WITHOUT CONTRAST TECHNIQUE: Contiguous axial images were obtained from the base of the skull through the vertex without intravenous contrast. COMPARISON:  08/18/2020 head CT FINDINGS: Brain: No evidence of acute infarction, hemorrhage, hydrocephalus, extra-axial collection or mass lesion/mass effect. Atrophy and chronic small-vessel white matter ischemic changes are again noted. Vascular: Heavy carotid and vertebral atherosclerotic calcifications are noted. Skull: Normal. Negative for fracture or focal lesion. Sinuses/Orbits: No acute finding. Other: None. IMPRESSION: 1. No evidence of acute intracranial abnormality. 2. Atrophy and chronic small-vessel white matter ischemic changes. Electronically Signed   By: Margarette Canada M.D.   On: 10/18/2020 18:19   CT Chest Wo Contrast  Result Date: 10/18/2020 CLINICAL DATA:  Abdominal pain.  Back pain. EXAM: CT CHEST, ABDOMEN AND PELVIS WITHOUT CONTRAST CT LUMBAR SPINE WITHOUT CONTRAST TECHNIQUE: Multidetector CT imaging of the chest, abdomen and pelvis was performed following the standard protocol without IV contrast. Multiplanar CT images of the lumbar spine were reconstructed from contemporary CT of the Chest, Abdomen, and Pelvis COMPARISON:  07/06/2020 FINDINGS: CT CHEST FINDINGS Cardiovascular: The heart size is mildly enlarged. There are  advanced coronary artery calcifications. There are atherosclerotic changes of the thoracic aorta without evidence for an aneurysm. The intracardiac blood pool is hypodense relative to the adjacent myocardium consistent with anemia. There is no large pericardial effusion. Mediastinum/Nodes: -- No mediastinal lymphadenopathy. -- No hilar lymphadenopathy. -- No axillary lymphadenopathy. -- No supraclavicular lymphadenopathy. -- Normal thyroid gland where visualized. -  Unremarkable esophagus. Lungs/Pleura: There are moderate to large bilateral pleural effusions, new since prior study. There is adjacent compressive atelectasis. There is no pneumothorax. The trachea is unremarkable. Musculoskeletal: Again identified are multilevel compression fractures of the thoracic spine. These fractures all appear to be essentially unchanged since the prior study in September. There are multiple healing right-sided rib fractures. There is a questionable nondisplaced fracture involving the posterior eleventh rib on the left. CT ABDOMEN PELVIS FINDINGS Hepatobiliary: The liver is normal. Normal gallbladder.There is no biliary ductal  dilation. Pancreas: Normal contours without ductal dilatation. No peripancreatic fluid collection. Spleen: Unremarkable. Adrenals/Urinary Tract: --Adrenal glands: Unremarkable. --Right kidney/ureter: No hydronephrosis or radiopaque kidney stones. --Left kidney/ureter: No hydronephrosis or radiopaque kidney stones. --Urinary bladder: Unremarkable. Stomach/Bowel: --Stomach/Duodenum: No hiatal hernia or other gastric abnormality. Normal duodenal course and caliber. --Small bowel: Unremarkable. --Colon: Unremarkable. --Appendix: Not visualized. No right lower quadrant inflammation or free fluid. Vascular/Lymphatic: Atherosclerotic calcification is present within the non-aneurysmal abdominal aorta, without hemodynamically significant stenosis. --No retroperitoneal lymphadenopathy. --No mesenteric  lymphadenopathy. --No pelvic or inguinal lymphadenopathy. Reproductive: Unremarkable Other: No ascites or free air. There is mild diffuse body wall edema. Musculoskeletal. An old L1 compression fractures again noted. There is advanced disc height loss at the L4-L5 and L5-S1 levels. There is no definite acute displaced fracture. The patient is status post prior total hip arthroplasty on the right. The patient is status post prior intramedullary nail placement on the left. IMPRESSION: 1. Moderate to large bilateral pleural effusions with adjacent compressive atelectasis, new since prior study. 2. No acute intra-abdominal or pelvic pathology. 3. Multiple healing right-sided rib fractures. Questionable nondisplaced fracture involving the posterior eleventh rib on the left. 4. Chronic compression fractures are noted throughout the thoracolumbar spine. No new compression fracture was identified on today's study. 5. Anasarca. 6. Anemia. Aortic Atherosclerosis (ICD10-I70.0). Electronically Signed   By: Constance Holster M.D.   On: 10/18/2020 18:27   CT L-SPINE NO CHARGE  Result Date: 10/18/2020 CLINICAL DATA:  Abdominal pain.  Back pain. EXAM: CT CHEST, ABDOMEN AND PELVIS WITHOUT CONTRAST CT LUMBAR SPINE WITHOUT CONTRAST TECHNIQUE: Multidetector CT imaging of the chest, abdomen and pelvis was performed following the standard protocol without IV contrast. Multiplanar CT images of the lumbar spine were reconstructed from contemporary CT of the Chest, Abdomen, and Pelvis COMPARISON:  07/06/2020 FINDINGS: CT CHEST FINDINGS Cardiovascular: The heart size is mildly enlarged. There are advanced coronary artery calcifications. There are atherosclerotic changes of the thoracic aorta without evidence for an aneurysm. The intracardiac blood pool is hypodense relative to the adjacent myocardium consistent with anemia. There is no large pericardial effusion. Mediastinum/Nodes: -- No mediastinal lymphadenopathy. -- No hilar  lymphadenopathy. -- No axillary lymphadenopathy. -- No supraclavicular lymphadenopathy. -- Normal thyroid gland where visualized. -  Unremarkable esophagus. Lungs/Pleura: There are moderate to large bilateral pleural effusions, new since prior study. There is adjacent compressive atelectasis. There is no pneumothorax. The trachea is unremarkable. Musculoskeletal: Again identified are multilevel compression fractures of the thoracic spine. These fractures all appear to be essentially unchanged since the prior study in September. There are multiple healing right-sided rib fractures. There is a questionable nondisplaced fracture involving the posterior eleventh rib on the left. CT ABDOMEN PELVIS FINDINGS Hepatobiliary: The liver is normal. Normal gallbladder.There is no biliary ductal dilation. Pancreas: Normal contours without ductal dilatation. No peripancreatic fluid collection. Spleen: Unremarkable. Adrenals/Urinary Tract: --Adrenal glands: Unremarkable. --Right kidney/ureter: No hydronephrosis or radiopaque kidney stones. --Left kidney/ureter: No hydronephrosis or radiopaque kidney stones. --Urinary bladder: Unremarkable. Stomach/Bowel: --Stomach/Duodenum: No hiatal hernia or other gastric abnormality. Normal duodenal course and caliber. --Small bowel: Unremarkable. --Colon: Unremarkable. --Appendix: Not visualized. No right lower quadrant inflammation or free fluid. Vascular/Lymphatic: Atherosclerotic calcification is present within the non-aneurysmal abdominal aorta, without hemodynamically significant stenosis. --No retroperitoneal lymphadenopathy. --No mesenteric lymphadenopathy. --No pelvic or inguinal lymphadenopathy. Reproductive: Unremarkable Other: No ascites or free air. There is mild diffuse body wall edema. Musculoskeletal. An old L1 compression fractures again noted. There is advanced disc height loss at the L4-L5 and  L5-S1 levels. There is no definite acute displaced fracture. The patient is status  post prior total hip arthroplasty on the right. The patient is status post prior intramedullary nail placement on the left. IMPRESSION: 1. Moderate to large bilateral pleural effusions with adjacent compressive atelectasis, new since prior study. 2. No acute intra-abdominal or pelvic pathology. 3. Multiple healing right-sided rib fractures. Questionable nondisplaced fracture involving the posterior eleventh rib on the left. 4. Chronic compression fractures are noted throughout the thoracolumbar spine. No new compression fracture was identified on today's study. 5. Anasarca. 6. Anemia. Aortic Atherosclerosis (ICD10-I70.0). Electronically Signed   By: Constance Holster M.D.   On: 10/18/2020 18:27   DG Chest Portable 1 View  Result Date: 10/18/2020 CLINICAL DATA:  Shortness of breath EXAM: PORTABLE CHEST 1 VIEW COMPARISON:  August 19, 2020 FINDINGS: There is cardiomegaly with pulmonary vascularity normal. There is ill-defined airspace opacity in the lower lung regions bilaterally. No consolidation. No appreciable adenopathy. No bone lesions. IMPRESSION: Ill-defined airspace opacity in the lung bases, slightly more notable on the left than on the right. Suspect bibasilar pneumonia. Question atypical organism pneumonia. Advise COVID-19 status check. Cardiomegaly with pulmonary vascular within normal limits. No adenopathy demonstrable. Electronically Signed   By: Lowella Grip III M.D.   On: 10/18/2020 15:48    Microbiology: Recent Results (from the past 240 hour(s))  Resp Panel by RT-PCR (Flu A&B, Covid) Nasopharyngeal Swab     Status: None   Collection Time: 10/18/20  3:13 PM   Specimen: Nasopharyngeal Swab; Nasopharyngeal(NP) swabs in vial transport medium  Result Value Ref Range Status   SARS Coronavirus 2 by RT PCR NEGATIVE NEGATIVE Final    Comment: (NOTE) SARS-CoV-2 target nucleic acids are NOT DETECTED.  The SARS-CoV-2 RNA is generally detectable in upper respiratory specimens during the  acute phase of infection. The lowest concentration of SARS-CoV-2 viral copies this assay can detect is 138 copies/mL. A negative result does not preclude SARS-Cov-2 infection and should not be used as the sole basis for treatment or other patient management decisions. A negative result may occur with  improper specimen collection/handling, submission of specimen other than nasopharyngeal swab, presence of viral mutation(s) within the areas targeted by this assay, and inadequate number of viral copies(<138 copies/mL). A negative result must be combined with clinical observations, patient history, and epidemiological information. The expected result is Negative.  Fact Sheet for Patients:  EntrepreneurPulse.com.au  Fact Sheet for Healthcare Providers:  IncredibleEmployment.be  This test is no t yet approved or cleared by the [place] FDA and  has been authorized for detection and/or diagnosis of SARS-CoV-2 by FDA under an Emergency Use Authorization (EUA). This EUA will remain  in effect (meaning this test can be used) for the duration of the COVID-19 declaration under Section 564(b)(1) of the Act, 21 U.S.C.section 360bbb-3(b)(1), unless the authorization is terminated  or revoked sooner.       Influenza A by PCR NEGATIVE NEGATIVE Final   Influenza B by PCR NEGATIVE NEGATIVE Final    Comment: (NOTE) The Xpert Xpress SARS-CoV-2/FLU/RSV plus assay is intended as an aid in the diagnosis of influenza from Nasopharyngeal swab specimens and should not be used as a sole basis for treatment. Nasal washings and aspirates are unacceptable for Xpert Xpress SARS-CoV-2/FLU/RSV testing.  Fact Sheet for Patients: EntrepreneurPulse.com.au  Fact Sheet for Healthcare Providers: IncredibleEmployment.be  This test is not yet approved or cleared by the [place] FDA and has been authorized for detection and/or  diagnosis of  SARS-CoV-2 by FDA under an Emergency Use Authorization (EUA). This EUA will remain in effect (meaning this test can be used) for the duration of the COVID-19 declaration under Section 564(b)(1) of the Act, 21 U.S.C. section 360bbb-3(b)(1), unless the authorization is terminated or revoked.  Performed at [facility], [place] [address] [place], [place] [address]   MRSA PCR Screening     Status: Abnormal   Collection Time: 10/18/20 10:59 PM   Specimen: Nasal Mucosa; Nasopharyngeal  Result Value Ref Range Status   MRSA by PCR POSITIVE (A) NEGATIVE Final    Comment:        The GeneXpert MRSA Assay (FDA approved for NASAL specimens only), is one component of a comprehensive MRSA colonization surveillance program. It is not intended to diagnose MRSA infection nor to guide or monitor treatment for MRSA infections. RESULT CALLED TO, READ BACK BY AND VERIFIED WITH: LOWE RN 10/19/20 0349 JDW Performed at [facility], [address], [place], [place] [id number]      Labs: Basic Metabolic Panel: Recent Labs  Lab 10/18/20 1516 10/19/20 0453  NA 142 144  K 3.1* 3.4*  CL 104 105  CO2 21* 24  GLUCOSE 288* 285*  BUN 98* 94*  CREATININE 7.63* 7.56*  CALCIUM 7.0* 7.1*  MG 2.1 2.0  PHOS  --  7.4*  7.3*   Liver Function Tests: Recent Labs  Lab 10/18/20 1516 10/19/20 0453  AST 27 25  ALT 16 16  ALKPHOS 72 60  BILITOT 0.9 0.8  PROT 5.6* 5.3*  ALBUMIN 2.3* 2.1*   No results for input(s): LIPASE, AMYLASE in the last 168 hours. No results for input(s): AMMONIA in the last 168 hours. CBC: Recent Labs  Lab 10/18/20 1516 10/19/20 0453  WBC 4.6 4.9  NEUTROABS  --  2.9  HGB 8.2* 7.4*  HCT 26.4* 23.5*  MCV 95.3 94.4  PLT 225 218   Cardiac Enzymes: Recent Labs  Lab 10/19/20 0453  CKTOTAL 671*   BNP: BNP (last 3 results) Recent Labs    08/14/20 1713 08/18/20 2330 10/18/20 1519  BNP 178.6* 206.9* 1,167.4*    ProBNP (last 3 results) No  results for input(s): PROBNP in the last 8760 hours.  CBG: Recent Labs  Lab 10/22/20 0007 10/22/20 0358 10/22/20 0751 10/22/20 1143 10/22/20 1551  GLUCAP 197* 194* 195* 276* 320*    Active Problems:   Anemia of chronic disease   Acute metabolic encephalopathy   Hyperlipidemia associated with type 2 diabetes mellitus ([place])   Hypertension associated with diabetes ([place])   History of cerebrovascular accident (CVA) in adulthood   Dementia ([name])   CKD (chronic kidney disease) stage 4, GFR 15-29 ml/min ([name])   Hypothyroidism   Type 1 diabetes mellitus with hyperglycemia ([name])   Fluid overload   Prolonged QT interval   Aspiration, chronic pulmonary   Volume overload   Time coordinating discharge: 38 minutes  Signed:        Neils Siracusa, DO [facility]  10/22/2020, 3:58 PM

## 2020-10-22 NOTE — Progress Notes (Addendum)
Manufacturing engineer [facility]) Hospital Liaison Note  Spoke to patient sister [name] [name] to support and confirm that family is ready for patient's possible discharge today.  DME is scheduled to be delivered to the home at noon.   Once the bed is delivery is confirmed, an [facility] HLT will update this note so TOC can arrange transport for patient if medically stable to discharge today.  Thank you for giving [facility] the opportunity to participate in this patients care,  Gar Ponto, RN [facility] (on [place]) [phone]  UPDATE: Per family - Patient DME has been delivered at home and they are ready to receive patient once discharged by MD. [facility] made aware via Epic chat.

## 2020-10-22 NOTE — Care Management Important Message (Signed)
Important Message  Patient Details  Name: [name] [name] MRN: [id number] Date of Birth: 1946/03/05   Medicare Important Message Given:  Yes     Orbie Pyo 10/22/2020, 4:05 PM

## 2020-10-22 NOTE — Progress Notes (Signed)
Nsg Discharge Note  Admit Date:  10/18/2020 Discharge date: 10/22/2020   [name] [name] to be D/C'd home with hospice per MD order.  AVS completed. Patient/caregiver able to verbalize understanding.  Discharge Medication: Allergies as of 10/22/2020   No Known Allergies     Medication List    STOP taking these medications   atorvastatin 40 MG tablet Commonly known as: LIPITOR     TAKE these medications   Accu-Chek Guide w/Device Kit 1 Device by Does not apply route QID.   acetaminophen 325 MG tablet Commonly known as: Tylenol Take 2 tablets (650 mg total) by mouth every 6 (six) hours as needed for up to 30 doses for mild pain or moderate pain.   amLODipine 10 MG tablet Commonly known as: NORVASC Take 1 tablet (10 mg total) by mouth daily.   Basaglar KwikPen 100 UNIT/ML Inject 5 Units into the skin at bedtime.   calcium acetate 667 MG capsule Commonly known as: PHOSLO Take 2 capsules (1,334 mg total) by mouth 3 (three) times daily with meals.   cyanocobalamin 1000 MCG tablet Take 1 tablet (1,000 mcg total) by mouth daily.   docusate sodium 100 MG capsule Commonly known as: COLACE Take 1 capsule (100 mg total) by mouth 2 (two) times daily.   feeding supplement Liqd Take 237 mLs by mouth 2 (two) times daily between meals. Start taking on: October 23, 2020   furosemide 40 MG tablet Commonly known as: LASIX Take 40 mg by mouth 2 (two) times daily.   glucose blood test strip Commonly known as: True Metrix Blood Glucose Test CHECK BLOOD SUGAR UP TO 4 TIMES A DAY. What changed:   how much to take  how to take this  when to take this  additional instructions   glucose blood test strip Commonly known as: Accu-Chek Guide Use as instructed What changed: Another medication with the same name was changed. Make sure you understand how and when to take each.   Accu-Chek Guide test strip Generic drug: glucose blood USE AS DIRECTED What changed: Another medication with  the same name was changed. Make sure you understand how and when to take each.   hydrALAZINE 100 MG tablet Commonly known as: APRESOLINE TAKE 1 TABLET BY MOUTH 3 TIMES A DAY.   hyoscyamine 0.125 MG SL tablet Commonly known as: LEVSIN SL Place 1 tablet (0.125 mg total) under the tongue every 4 (four) hours as needed (excess oral secretions).   insulin lispro 100 UNIT/ML KwikPen Commonly known as: HumaLOG KwikPen Inject 0-10 Units into the skin 3 (three) times daily as needed (blood sugar). Max daily 30 units   Insulin Pen Needle 32G X 4 MM Misc 1 Device by Does not apply route in the morning, at noon, in the evening, and at bedtime.   Insulin Syringe-Needle U-100 31G X 5/16" 0.3 ML Misc Commonly known as: BD Insulin Syringe U/F Inject 1 each as directed 2 (two) times daily. as directed   levothyroxine 25 MCG tablet Commonly known as: SYNTHROID TAKE 1 TABLET EACH DAY. What changed: See the new instructions.   LORazepam 0.5 MG tablet Commonly known as: ATIVAN Take 1 tablet (0.5 mg total) by mouth every 4 (four) hours as needed for anxiety. May crush, mix with water and give sublingually if needed.   oxyCODONE 5 MG immediate release tablet Commonly known as: Oxy IR/ROXICODONE Take 1 tablet (5 mg total) by mouth every 4 (four) hours as needed for severe pain. May crush, mix with water  and give sublingually if needed.   prochlorperazine 10 MG tablet Commonly known as: COMPAZINE Take 1 tablet (10 mg total) by mouth every 4 (four) hours as needed for nausea or vomiting. May crush, mix with water and give sublingually.   sodium bicarbonate 650 MG tablet Take 1 tablet (650 mg total) by mouth 2 (two) times daily.       Discharge Assessment: Vitals:   10/22/20 0400 10/22/20 1513  BP: 132/81 (!) 141/69  Pulse: 90 88  Resp: 18   Temp: 98.8 F (37.1 C) 98.3 F (36.8 C)  SpO2: 96% 95%   Skin clean, dry and intact without evidence of skin break down, no evidence of skin tears  noted. IV catheter discontinued intact. Site without signs and symptoms of complications - no redness or edema noted at insertion site, patient denies c/o pain - only slight tenderness at site.  Dressing with slight pressure applied.  D/c Instructions-Education: Discharge instructions given to patient/family with verbalized understanding. D/c education completed with patient/family including follow up instructions, medication list, d/c activities limitations if indicated, with other d/c instructions as indicated by MD - patient able to verbalize understanding, all questions fully answered. Patient instructed to return to ED, call 911, or call MD for any changes in condition.  Patient escorted via [place], and D/C home via ptar.  Atilano Ina, RN 10/22/2020 6:26 PM

## 2020-10-22 NOTE — Discharge Instructions (Signed)
End-Stage Kidney Disease End-stage kidney disease occurs when the kidneys are so damaged that they cannot function and cannot get better. This condition may also be referred to as end-stage renal disease or ESRD. The kidneys are two organs that do many important jobs in the body, including:  Removing waste and extra fluid from the blood to make urine.  Making hormones that maintain the amount of fluid in tissues and blood vessels.  Maintaining the right amount of fluids and chemicals in the body. Without functioning kidneys, toxins build up in the blood, and life-threatening complications can occur. What are the causes? This condition usually occurs when a long-term (chronic) kidney disease gets worse and results in permanent damage to the kidneys. It may also be caused by sudden damage to the kidneys (acute kidney injury). What increases the risk? The following factors may make you more likely to develop this condition:  Having a family history of chronic kidney disease (CKD).  Chronic medical conditions that affect the kidneys, such as: ? Cardiovascular disease, including high blood pressure. ? Diabetes. ? Certain diseases that affect the body's disease-fighting system (immunesystem).  Smoking or a history of smoking.  Overuse of over-the-counter pain medicines.  Being around or being in contact with poisonous (toxic) substances. What are the signs or symptoms? Symptoms of this condition include:  Swelling of the face, legs, ankles, or feet.  Numbness, tingling, or loss of feeling in the hands or feet.  Tiredness (lethargy).  Nausea or vomiting.  Confusion, trouble concentrating, or loss of consciousness.  Chest pain.  Shortness of breath.  Passing little or no urine.  Muscle twitches and cramps, especially in the legs.  Skin changes, such as: ? Dry, itchy skin. ? Pale skin due to anemia, including the skin and tissue around the eye (conjunctiva). ? Abnormally dark  or light skin.  Loss of appetite.  Headaches.  Decrease in muscle size (muscle wasting).  Easy bruising.  Stopping of menstrual periods, in women.  Jerky movements (seizures). How is this diagnosed? This condition may be diagnosed based on:  A physical exam, including blood pressure measurements.  Urine tests.  Blood tests.  Imaging tests.  A test in which a sample of tissue is removed from the kidneys to be examined under a microscope (kidney biopsy). How is this treated? This condition may be treated with:  Dialysis, which is a procedure that removes toxic wastes from the body. There are two types of dialysis: ? Dialysis that is done through your abdomen. This is called peritoneal dialysis and may be done several times a day. ? Dialysis that is done by a machine. This is called hemodialysis and may be done several times a week.  Kidney transplant. This is surgery to receive a new kidney. In addition to having dialysis or a kidney transplant, you may need to take medicines:  To control high blood pressure (hypertension).  To control high cholesterol.  To treat diabetes.  To maintain healthy levels of minerals in the blood (electrolytes). You may also be given a specific meal plan to follow that includes requirements or limits for:  Salt (sodium).  Protein.  Phosphorus.  Potassium.  Calcium. Follow these instructions at home: Medicines  Take over-the-counter and prescription medicines only as told by your health care provider.  Do not take any new medicines, vitamins, or mineral supplements unless approved by your health care provider. Many medicines and supplements can worsen kidney damage.  Follow instructions from your health care provider about  adjusting the doses of any medicines you take. Lifestyle  Do not use any products that contain nicotine or tobacco, such as cigarettes, e-cigarettes, and chewing tobacco. If you need help quitting, ask your  health care provider.  Achieve and maintain a healthy weight. If you need help with this, ask your health care provider.  Start or continue an exercise plan. Exercise at least 30 minutes a day, 5 days a week.  Follow your prescribed meal plan. General instructions  Stay current with your shots (immunizations) as told by your health care provider.  Keep track of your blood pressure. Report changes in your blood pressure as told by your health care provider.  If you are being treated for diabetes, monitor and track your blood sugar (blood glucose) levels as told by your health care provider.  Keep all follow-up visits. This is important. Where to find more information  American Association of Kidney Patients: BombTimer.gl  National Kidney Foundation: www.kidney.[place]: https://mathis.com/  Life Options Rehabilitation Program: www.lifeoptions.org  Kidney School: www.kidneyschool.org Contact a health care provider if:  Your symptoms get worse.  You develop new symptoms. Get help right away if:  You have weakness in an arm or leg on one side of your body.  You have difficulty speaking or you are slurring your speech.  You have a sudden change in your vision.  You have a sudden, severe headache.  You have a sudden weight increase.  You have difficulty breathing.  Your symptoms suddenly get worse.  You have chest pain. Summary  End-stage kidney disease occurs when the kidneys are so damaged that they cannot function and cannot get better.  Without functioning kidneys, toxins build up in the blood and life-threatening complications can occur.  Treatment may include dialysis or a kidney transplant along with medicines and lifestyle changes. This information is not intended to replace advice given to you by your health care provider. Make sure you discuss any questions you have with your health care provider. Document Revised: 01/23/2020 Document Reviewed:  12/30/2019 Elsevier Patient Education  2021 [place], Adult Being a patient in the hospital puts you at risk for falling. Falls can cause serious injury and harm, but they can be prevented. It is important to understand what puts you at risk for falling and what you and your health care team can do to prevent you from falling. If you or a loved one falls at the hospital, it is important to tell hospital staff about it. What increases the risk for falls? Certain conditions and treatments may increase your risk of falling in the hospital. These include:  Being in an unfamiliar environment, especially when using the bathroom at night.  Having surgery.  Being on bed rest.  Taking many medicines or certain types of medicines, such as sleeping pills.  Having tubes in place, such as IV lines or catheters. Other risk factors for falls in a hospital include:  Having difficulty with hearing or vision.  Having a change in thinking or behavior, such as confusion.  Having depression.  Having trouble with balance.  Being a male.  Feeling dizzy.  Needing to use the toilet frequently.  Having fallen during the past three months.  Having low blood pressure. What are some strategies for preventing falls? If you or a loved one has to stay in the hospital:  Ask about which fall prevention strategies will be in place. Do not hesitate to speak up  if you notice that the fall prevention plan has changed.  Ask for help moving around, especially after surgery or when feeling unwell.  If you have been asked to call for help when getting up, do not get up by yourself. Asking for help with getting up is for your safety, and the staff is there to help you.  Wear nonskid footwear.  Get up slowly, and sit at the side of the bed for a few minutes before standing up.  Keep items you need, such as the nurse call button or a phone, close to you so that you do not  need to reach for them.  Wear eyeglasses or hearing aids if you have them.  Have someone stay in the hospital with you or your loved one.  Ask if sleeping pills or other medicines that can cause confusion are necessary.   What does the hospital staff do to help prevent falls? Hospitals have systems in place to prevent falls and accidents, which may involve:  Discussing your fall risks and making a personalized fall prevention plan.  Checking in regularly to see if you need help.  Placing an arm band on your wrist or a sign near your room to alert other staff of your needs.  Using an alarm on your hospital bed. This is an alarm that goes off if you get out of bed and forget to call for help.  Keeping the bed in a low and locked position.  Keeping the area around the bed and bathroom well-lit and free from clutter.  Keeping your room quiet, so that you can sleep and be well-rested.  Using safety equipment, such as: ? A belt around your waist. ? Walkers, crutches, and other devices for support. ? Safety beds, such as low beds or cushions on the floor next to the bed.  Having a staff person stay with you (one-on-one observation), even when you are using the bathroom. This is for your safety.  Using video monitoring. This allows a staff member to come to help you if you need help.   What other actions can I take to lower my risk of falls?  Check in regularly with your health care provider or pharmacist to review all of the medicines that you take.  Make sure that you have a regular exercise program to stay fit. This will help you maintain your balance.  Talk with a physical therapist or trainer if recommended by your health care provider. They can help you to improve your strength, balance, and endurance.  If you are over age 50: ? Ask your health care provider if you need a calcium or vitamin D supplement. ? Have your eyes and hearing checked every year. ? Have your feet  checked every year. Where to find more information You can find more information about fall prevention from the Centers for Disease Control and Prevention: ImproveLook.cz Summary  Being in an unfamiliar environment, such as the hospital, increases your risk for falling.  If you have been asked to call for help when getting up, do not get up by yourself. Asking for help with getting up is for your safety, and the staff is there to help you.  Ask about which fall prevention strategies will be in place. Do not hesitate to speak up if you notice that the fall prevention plan has changed.  If you or a loved one falls, tell the hospital staff. This is important. This information is not intended to replace advice  given to you by your health care provider. Make sure you discuss any questions you have with your health care provider. Document Revised: 09/11/2017 Document Reviewed: 05/13/2017 Elsevier Patient Education  2021 [employer].

## 2020-10-22 NOTE — Progress Notes (Signed)
[facility] Consult Note Telephone: [phone]  Fax: [phone]  PATIENT NAME: [name] [name] DOB: 01/22/1946 MRN: [id number]  PRIMARY CARE PROVIDER:   Libby Maw, MD  REFERRING PROVIDER:  Dr. Candiss Norse  RESPONSIBLE PARTY:   Self     RECOMMENDATIONS and PLAN:  Palliative care encounter  Z51.5  1.  Advance care planning:  DNAR and MOST forms previously completed and on file.  Previous recommendations for transition to Hospice were reiterated to sister [name] via phone.  No plans for doing same at this time. She plans on attempting to speak to pt. Again about additional plans related to his health and f/u with Palliative care as needed.   2. Chronic kidney disease stage V:  Most recent labs:  crea 6.56  GFR 8.  Pt refused visit.  Encouraged siser to have him f/u with his Nephrologist if there was no plans to transition care to Hospice.   3.  Edema: Unable to evaluate   I spent 15 minutes providing this consultation,  from 1300 to 1315. More than 50% of the time in this consultation was spent coordinating communication with patient, sister(via phone) and caregiver. Phoned sister/caregiver [name] after patient refused physical visit.  HISTORY OF PRESENT ILLNESS: F/U with [name].  Caregiver reports that patient eats very little and is unsure how often he gets out of bed. Sister(via phone) reports that patient continues to not care for himself as he should but "I can't make him do anything that he does not want to". Reports that he eats sporadically and takes his medications not as prescribed.   Palliative Care was asked to help address goals of care.   CODE STATUS: DNAR/DNI  PPS: 40% weak   Per sister's report  HOSPICE ELIGIBILITY/DIAGNOSIS: YES/  CKD stage V without plans for hemodialysis.  Patient refuses  PAST MEDICAL HISTORY:  Past Medical History:  Diagnosis Date  . Cerebral infarction due to thrombosis of right posterior  cerebral artery ([place]) 06/08/2015  . Closed comminuted intertrochanteric fracture of left femur ([place])   . Diabetes mellitus without complication ([place])   . Diabetic hyperosmolar non-ketotic state ([place]) 05/15/2016  . DKA (diabetic ketoacidoses) 05/09/2016  . Hypertension   . Hypoglycemia 11/29/2019  . Postoperative anemia due to acute blood loss 06/18/2016  . Retroperitoneal hematoma 06/18/2016  . Stroke ([place])   . Vitamin B 12 deficiency 06/18/2016    SOCIAL HX: Lives with sister, centarian mother and brother who has Downs Syndrome Social History   Tobacco Use  . Smoking status: Former Smoker    Packs/day: 0.25    Years: 30.00    Pack years: 7.50    Types: Cigarettes    Quit date: 02/01/2016    Years since quitting: 4.5  . Smokeless tobacco: Never Used  Substance Use Topics  . Alcohol use: No    ALLERGIES: No Known Allergies    PERTINENT MEDICATIONS:  Outpatient Encounter Medications as of 08/01/2020  Medication Sig  . ACCU-CHEK GUIDE test strip USE AS DIRECTED  . acetaminophen (TYLENOL) 325 MG tablet Take 2 tablets (650 mg total) by mouth every 6 (six) hours as needed for up to 30 doses for mild pain or moderate pain.  Marland Kitchen amLODipine (NORVASC) 10 MG tablet Take 1 tablet (10 mg total) by mouth daily.  Marland Kitchen atorvastatin (LIPITOR) 40 MG tablet Take 1 tablet (40 mg total) by mouth daily.  . Blood Glucose Monitoring Suppl (ACCU-CHEK GUIDE) w/Device KIT 1 Device by Does  not apply route QID.  Marland Kitchen glucose blood (ACCU-CHEK GUIDE) test strip Use as instructed  . glucose blood (TRUE METRIX BLOOD GLUCOSE TEST) test strip CHECK BLOOD SUGAR UP TO 4 TIMES A DAY. (Patient taking differently: 1 each by Other route See admin instructions. CHECK BLOOD SUGAR UP TO 2 TIMES A DAY.)  . hydrALAZINE (APRESOLINE) 100 MG tablet TAKE 1 TABLET BY MOUTH 3 TIMES A DAY.  Marland Kitchen Insulin Pen Needle 32G X 4 MM MISC 1 Device by Does not apply route in the morning, at noon, in the evening, and at bedtime.  . Insulin Syringe-Needle  U-100 (BD INSULIN SYRINGE U/F) 31G X 5/16" 0.3 ML MISC Inject 1 each as directed 2 (two) times daily. as directed  . levothyroxine (SYNTHROID) 25 MCG tablet Take 1 tablet (25 mcg total) by mouth daily before breakfast.  . sodium bicarbonate 650 MG tablet Take 1 tablet (650 mg total) by mouth 2 (two) times daily.  . vitamin B-12 1000 MCG tablet Take 1 tablet (1,000 mcg total) by mouth daily.  . Vitamin D, Ergocalciferol, (DRISDOL) 1.25 MG (50000 UNIT) CAPS capsule Take 50,000 Units by mouth every 7 (seven) days. Thursdays  . [DISCONTINUED] aspirin EC 81 MG EC tablet Take 1 tablet (81 mg total) by mouth daily. Swallow whole. (Patient not taking: Reported on 08/14/2020)  . [DISCONTINUED] furosemide (LASIX) 20 MG tablet Take 1 tablet (20 mg total) by mouth daily. (Patient not taking: Reported on 08/14/2020)  . [DISCONTINUED] Insulin Glargine (BASAGLAR KWIKPEN) 100 UNIT/ML Inject 6 Units into the skin daily. (Patient taking differently: Inject 5 Units into the skin at bedtime. )  . [DISCONTINUED] insulin lispro (ADMELOG SOLOSTAR) 100 UNIT/ML KwikPen Max daily 30 units daily   No facility-administered encounter medications on file as of 08/01/2020.    PHYSICAL EXAM:   Gen:  Unable to visualize patient due to his refusal Psych:  Agitated mood.  Pt yelled from his closed bedroom door that " he did not want to see anyone and wanted to be left alone"  Gonzella Lex, NP-C

## 2020-10-22 NOTE — Plan of Care (Signed)
  Problem: Education: Goal: Knowledge of General Education information will improve Description: Including pain rating scale, medication(s)/side effects and non-pharmacologic comfort measures Outcome: Adequate for Discharge   Problem: Health Behavior/Discharge Planning: Goal: Ability to manage health-related needs will improve Outcome: Adequate for Discharge   Problem: Clinical Measurements: Goal: Ability to maintain clinical measurements within normal limits will improve Outcome: Adequate for Discharge Goal: Will remain free from infection Outcome: Adequate for Discharge Goal: Diagnostic test results will improve Outcome: Adequate for Discharge   Problem: Activity: Goal: Risk for activity intolerance will decrease Outcome: Adequate for Discharge   Problem: Nutrition: Goal: Adequate nutrition will be maintained Outcome: Adequate for Discharge   Problem: Coping: Goal: Level of anxiety will decrease Outcome: Adequate for Discharge   Problem: Elimination: Goal: Will not experience complications related to bowel motility Outcome: Adequate for Discharge Goal: Will not experience complications related to urinary retention Outcome: Adequate for Discharge   Problem: Pain Managment: Goal: General experience of comfort will improve Outcome: Adequate for Discharge   Problem: Safety: Goal: Ability to remain free from injury will improve Outcome: Adequate for Discharge   Problem: Skin Integrity: Goal: Risk for impaired skin integrity will decrease Outcome: Adequate for Discharge

## 2020-10-23 NOTE — Telephone Encounter (Signed)
Please advise message below [facility] calling for hospice orders to come out to patients home. Also would ike to know if anyone could be patients attending?

## 2020-10-23 NOTE — Telephone Encounter (Signed)
I will be pts attending of record for hospice. Ok to give verbal orders as requested below

## 2020-10-23 NOTE — Telephone Encounter (Signed)
Padonda defers to Dr. Bryan Lemma - please advise asap as pt discharged 10/22/2020. Call Tammy w/[facility] at [phone]

## 2020-10-24 NOTE — Telephone Encounter (Signed)
I called [name] [name] w/[facility] and notified that Dr. Bryan Lemma will be attending of record for hospice.

## 2020-10-31 ENCOUNTER — Other Ambulatory Visit (HOSPITAL_COMMUNITY): Payer: Self-pay | Admitting: Internal Medicine

## 2020-11-12 ENCOUNTER — Telehealth: Payer: Self-pay | Admitting: Family Medicine

## 2020-11-12 MED FILL — HUMALOG 100 UNITS/ML KWIKPE: 100 | 30 days supply | Qty: 9 | Fill #0

## 2020-11-12 MED FILL — BASAGLAR 100 UNIT/ML KWIKPE: 100 | 28 days supply | Qty: 3 | Fill #0

## 2020-11-12 NOTE — Telephone Encounter (Signed)
Novalog not covered under hospice services. They are needing an alternative on what they can provide to meet pts needs and requesting call back from covering provider. Baldo Ash Doc of the Day)  [facility] w/[facility]. Ph# [phone]

## 2020-11-13 NOTE — Telephone Encounter (Signed)
[name] [name] states patient Rx has been taken care of because she received the orders from the hospital.

## 2020-11-13 NOTE — Telephone Encounter (Signed)
Looks like he was discharged home with humalog and not novolog. Is that what hospice is referring to?

## 2020-11-25 ENCOUNTER — Observation Stay (HOSPITAL_COMMUNITY)

## 2020-11-25 ENCOUNTER — Emergency Department (HOSPITAL_COMMUNITY)

## 2020-11-25 ENCOUNTER — Inpatient Hospital Stay (HOSPITAL_COMMUNITY)
Admission: EM | Admit: 2020-11-25 | Discharge: 2020-11-28 | DRG: 070 | Disposition: A | Discharge status: Hospice | Attending: Family Medicine | Admitting: Family Medicine

## 2020-11-25 ENCOUNTER — Other Ambulatory Visit: Payer: Self-pay

## 2020-11-25 ENCOUNTER — Encounter (HOSPITAL_COMMUNITY): Payer: Self-pay | Admitting: Emergency Medicine

## 2020-11-25 DIAGNOSIS — T68XXXA Hypothermia, initial encounter: Secondary | ICD-10-CM | POA: Diagnosis not present

## 2020-11-25 DIAGNOSIS — J189 Pneumonia, unspecified organism: Secondary | ICD-10-CM | POA: Diagnosis present

## 2020-11-25 DIAGNOSIS — IMO0002 Reserved for concepts with insufficient information to code with codable children: Secondary | ICD-10-CM

## 2020-11-25 DIAGNOSIS — G9341 Metabolic encephalopathy: Principal | ICD-10-CM | POA: Diagnosis present

## 2020-11-25 DIAGNOSIS — R4781 Slurred speech: Secondary | ICD-10-CM | POA: Diagnosis present

## 2020-11-25 DIAGNOSIS — X31XXXA Exposure to excessive natural cold, initial encounter: Secondary | ICD-10-CM

## 2020-11-25 DIAGNOSIS — Z20822 Contact with and (suspected) exposure to covid-19: Secondary | ICD-10-CM | POA: Diagnosis present

## 2020-11-25 DIAGNOSIS — E1121 Type 2 diabetes mellitus with diabetic nephropathy: Secondary | ICD-10-CM

## 2020-11-25 DIAGNOSIS — N184 Chronic kidney disease, stage 4 (severe): Secondary | ICD-10-CM | POA: Diagnosis present

## 2020-11-25 DIAGNOSIS — Z79890 Hormone replacement therapy (postmenopausal): Secondary | ICD-10-CM

## 2020-11-25 DIAGNOSIS — E538 Deficiency of other specified B group vitamins: Secondary | ICD-10-CM | POA: Diagnosis present

## 2020-11-25 DIAGNOSIS — I5033 Acute on chronic diastolic (congestive) heart failure: Secondary | ICD-10-CM | POA: Diagnosis present

## 2020-11-25 DIAGNOSIS — R4182 Altered mental status, unspecified: Secondary | ICD-10-CM

## 2020-11-25 DIAGNOSIS — I69328 Other speech and language deficits following cerebral infarction: Secondary | ICD-10-CM

## 2020-11-25 DIAGNOSIS — E8770 Fluid overload, unspecified: Secondary | ICD-10-CM | POA: Diagnosis not present

## 2020-11-25 DIAGNOSIS — E1165 Type 2 diabetes mellitus with hyperglycemia: Secondary | ICD-10-CM | POA: Diagnosis present

## 2020-11-25 DIAGNOSIS — Z9181 History of falling: Secondary | ICD-10-CM

## 2020-11-25 DIAGNOSIS — E039 Hypothyroidism, unspecified: Secondary | ICD-10-CM | POA: Diagnosis present

## 2020-11-25 DIAGNOSIS — Z66 Do not resuscitate: Secondary | ICD-10-CM | POA: Diagnosis present

## 2020-11-25 DIAGNOSIS — F0390 Unspecified dementia without behavioral disturbance: Secondary | ICD-10-CM | POA: Diagnosis present

## 2020-11-25 DIAGNOSIS — Z794 Long term (current) use of insulin: Secondary | ICD-10-CM

## 2020-11-25 DIAGNOSIS — Z8249 Family history of ischemic heart disease and other diseases of the circulatory system: Secondary | ICD-10-CM

## 2020-11-25 DIAGNOSIS — I132 Hypertensive heart and chronic kidney disease with heart failure and with stage 5 chronic kidney disease, or end stage renal disease: Secondary | ICD-10-CM | POA: Diagnosis present

## 2020-11-25 DIAGNOSIS — E1122 Type 2 diabetes mellitus with diabetic chronic kidney disease: Secondary | ICD-10-CM | POA: Diagnosis present

## 2020-11-25 DIAGNOSIS — Z83438 Family history of other disorder of lipoprotein metabolism and other lipidemia: Secondary | ICD-10-CM

## 2020-11-25 DIAGNOSIS — Z79899 Other long term (current) drug therapy: Secondary | ICD-10-CM

## 2020-11-25 DIAGNOSIS — N185 Chronic kidney disease, stage 5: Secondary | ICD-10-CM | POA: Diagnosis present

## 2020-11-25 DIAGNOSIS — D638 Anemia in other chronic diseases classified elsewhere: Secondary | ICD-10-CM | POA: Diagnosis present

## 2020-11-25 DIAGNOSIS — R296 Repeated falls: Secondary | ICD-10-CM | POA: Diagnosis present

## 2020-11-25 DIAGNOSIS — Z820 Family history of epilepsy and other diseases of the nervous system: Secondary | ICD-10-CM

## 2020-11-25 DIAGNOSIS — D631 Anemia in chronic kidney disease: Secondary | ICD-10-CM | POA: Diagnosis present

## 2020-11-25 DIAGNOSIS — Z96641 Presence of right artificial hip joint: Secondary | ICD-10-CM | POA: Diagnosis present

## 2020-11-25 LAB — URINALYSIS, MICROSCOPIC (REFLEX): Squamous Epithelial / HPF: NONE SEEN (ref 0–5)

## 2020-11-25 LAB — I-STAT CHEM 8, ED
BUN: 81 mg/dL — ABNORMAL HIGH (ref 8–23)
Calcium, Ion: 0.87 mmol/L — CL (ref 1.15–1.40)
Chloride: 103 mmol/L (ref 98–111)
Creatinine, Ser: 7.3 mg/dL — ABNORMAL HIGH (ref 0.61–1.24)
Glucose, Bld: 251 mg/dL — ABNORMAL HIGH (ref 70–99)
HCT: 24 % — ABNORMAL LOW (ref 39.0–52.0)
Hemoglobin: 8.2 g/dL — ABNORMAL LOW (ref 13.0–17.0)
Potassium: 3.3 mmol/L — ABNORMAL LOW (ref 3.5–5.1)
Sodium: 145 mmol/L (ref 135–145)
TCO2: 28 mmol/L (ref 22–32)

## 2020-11-25 LAB — I-STAT VENOUS BLOOD GAS, ED
Acid-Base Excess: 7 mmol/L — ABNORMAL HIGH (ref 0.0–2.0)
Bicarbonate: 31.2 mmol/L — ABNORMAL HIGH (ref 20.0–28.0)
Calcium, Ion: 0.91 mmol/L — ABNORMAL LOW (ref 1.15–1.40)
HCT: 23 % — ABNORMAL LOW (ref 39.0–52.0)
Hemoglobin: 7.8 g/dL — ABNORMAL LOW (ref 13.0–17.0)
O2 Saturation: 99 %
Potassium: 3.3 mmol/L — ABNORMAL LOW (ref 3.5–5.1)
Sodium: 145 mmol/L (ref 135–145)
TCO2: 33 mmol/L — ABNORMAL HIGH (ref 22–32)
pCO2, Ven: 44.8 mmHg (ref 44.0–60.0)
pH, Ven: 7.451 — ABNORMAL HIGH (ref 7.250–7.430)
pO2, Ven: 133 mmHg — ABNORMAL HIGH (ref 32.0–45.0)

## 2020-11-25 LAB — URINALYSIS, ROUTINE W REFLEX MICROSCOPIC
Bilirubin Urine: NEGATIVE
Glucose, UA: >=500 mg/dL — AB
Hgb urine dipstick: NEGATIVE
Ketones, ur: NEGATIVE mg/dL
Leukocytes,Ua: NEGATIVE
Nitrite: NEGATIVE
Protein, ur: >300 mg/dL — AB
Specific Gravity, Urine: 1.02 (ref 1.005–1.030)
pH: 8 (ref 5.0–8.0)

## 2020-11-25 LAB — DIFFERENTIAL
Abs Immature Granulocytes: 0.01 10*3/uL (ref 0.00–0.07)
Basophils Absolute: 0 10*3/uL (ref 0.0–0.1)
Basophils Relative: 1 %
Eosinophils Absolute: 0.2 10*3/uL (ref 0.0–0.5)
Eosinophils Relative: 5 %
Immature Granulocytes: 0 %
Lymphocytes Relative: 18 %
Lymphs Abs: 0.7 10*3/uL (ref 0.7–4.0)
Monocytes Absolute: 0.6 10*3/uL (ref 0.1–1.0)
Monocytes Relative: 14 %
Neutro Abs: 2.4 10*3/uL (ref 1.7–7.7)
Neutrophils Relative %: 62 %

## 2020-11-25 LAB — ETHANOL: Alcohol, Ethyl (B): <10 mg/dL (ref <10)

## 2020-11-25 LAB — RESP PANEL BY RT-PCR (FLU A&B, COVID) ARPGX2
Influenza A by PCR: NEGATIVE
Influenza B by PCR: NEGATIVE
SARS Coronavirus 2 by RT PCR: NEGATIVE

## 2020-11-25 LAB — CBC
HCT: 23 % — ABNORMAL LOW (ref 39.0–52.0)
Hemoglobin: 7 g/dL — ABNORMAL LOW (ref 13.0–17.0)
MCH: 29.5 pg (ref 26.0–34.0)
MCHC: 30.4 g/dL (ref 30.0–36.0)
MCV: 97 fL (ref 80.0–100.0)
Platelets: 150 10*3/uL (ref 150–400)
RBC: 2.37 MIL/uL — ABNORMAL LOW (ref 4.22–5.81)
RDW: 15.3 % (ref 11.5–15.5)
WBC: 3.9 10*3/uL — ABNORMAL LOW (ref 4.0–10.5)
nRBC: 0 % (ref 0.0–0.2)

## 2020-11-25 LAB — APTT: aPTT: 39 seconds — ABNORMAL HIGH (ref 24–36)

## 2020-11-25 LAB — AMMONIA: Ammonia: 16 umol/L (ref 9–35)

## 2020-11-25 LAB — COMPREHENSIVE METABOLIC PANEL
ALT: 14 U/L (ref 0–44)
AST: 28 U/L (ref 15–41)
Albumin: 2.5 g/dL — ABNORMAL LOW (ref 3.5–5.0)
Alkaline Phosphatase: 61 U/L (ref 38–126)
Anion gap: 14 (ref 5–15)
BUN: 80 mg/dL — ABNORMAL HIGH (ref 8–23)
CO2: 27 mmol/L (ref 22–32)
Calcium: 7.1 mg/dL — ABNORMAL LOW (ref 8.9–10.3)
Chloride: 104 mmol/L (ref 98–111)
Creatinine, Ser: 7.35 mg/dL — ABNORMAL HIGH (ref 0.61–1.24)
GFR, Estimated: 7 mL/min — ABNORMAL LOW (ref >60)
Glucose, Bld: 274 mg/dL — ABNORMAL HIGH (ref 70–99)
Potassium: 3.2 mmol/L — ABNORMAL LOW (ref 3.5–5.1)
Sodium: 145 mmol/L (ref 135–145)
Total Bilirubin: 0.5 mg/dL (ref 0.3–1.2)
Total Protein: 5.8 g/dL — ABNORMAL LOW (ref 6.5–8.1)

## 2020-11-25 LAB — LACTIC ACID, PLASMA
Lactic Acid, Venous: 2 mmol/L (ref 0.5–1.9)
Lactic Acid, Venous: 1.8 mmol/L (ref 0.5–1.9)

## 2020-11-25 LAB — POC OCCULT BLOOD, ED: Fecal Occult Bld: NEGATIVE

## 2020-11-25 LAB — TROPONIN I (HIGH SENSITIVITY): Troponin I (High Sensitivity): 51 ng/L — ABNORMAL HIGH (ref <18)

## 2020-11-25 LAB — PROTIME-INR
INR: 1.3 — ABNORMAL HIGH (ref 0.8–1.2)
Prothrombin Time: 15.4 seconds — ABNORMAL HIGH (ref 11.4–15.2)

## 2020-11-25 LAB — BRAIN NATRIURETIC PEPTIDE: B Natriuretic Peptide: 1100.2 pg/mL — ABNORMAL HIGH (ref 0.0–100.0)

## 2020-11-25 MED ORDER — SODIUM CHLORIDE 0.9 % IV SOLN
1.0000 g | INTRAVENOUS | Status: DC
  Administered 2020-11-26 – 2020-11-27 (×2): 1 g via INTRAVENOUS
  Filled 2020-11-25 (×3): qty 1

## 2020-11-25 MED ORDER — SODIUM BICARBONATE 650 MG PO TABS
650.0000 mg | ORAL_TABLET | Freq: Two times a day (BID) | ORAL | Status: DC
  Administered 2020-11-26 – 2020-11-28 (×6): 650 mg via ORAL
  Filled 2020-11-25 (×7): qty 1

## 2020-11-25 MED ORDER — CALCIUM ACETATE (PHOS BINDER) 667 MG PO CAPS
1334.0000 mg | ORAL_CAPSULE | Freq: Three times a day (TID) | ORAL | Status: DC
  Administered 2020-11-26 – 2020-11-28 (×8): 1334 mg via ORAL
  Filled 2020-11-25 (×8): qty 2

## 2020-11-25 MED ORDER — SODIUM CHLORIDE 0.9% FLUSH
3.0000 mL | Freq: Once | INTRAVENOUS | Status: AC
Start: 2020-11-25 — End: 2020-11-26
  Administered 2020-11-26: 3 mL via INTRAVENOUS

## 2020-11-25 MED ORDER — HYOSCYAMINE SULFATE 0.125 MG SL SUBL
0.1250 mg | SUBLINGUAL_TABLET | SUBLINGUAL | Status: DC | PRN
  Filled 2020-11-25: qty 1

## 2020-11-25 MED ORDER — ACETAMINOPHEN 325 MG PO TABS: 650.0000 mg | ORAL_TABLET | Freq: Four times a day (QID) | ORAL | Status: DC | PRN

## 2020-11-25 MED ORDER — AMLODIPINE BESYLATE 10 MG PO TABS
10.0000 mg | ORAL_TABLET | Freq: Every day | ORAL | Status: DC
  Administered 2020-11-26 – 2020-11-28 (×3): 10 mg via ORAL
  Filled 2020-11-25: qty 2
  Filled 2020-11-25 (×2): qty 1

## 2020-11-25 MED ORDER — LEVOTHYROXINE SODIUM 25 MCG PO TABS
25.0000 ug | ORAL_TABLET | Freq: Every day | ORAL | Status: DC
  Administered 2020-11-26: 25 ug via ORAL
  Filled 2020-11-25: qty 1

## 2020-11-25 MED ORDER — HYDRALAZINE HCL 50 MG PO TABS
100.0000 mg | ORAL_TABLET | Freq: Three times a day (TID) | ORAL | Status: DC
  Administered 2020-11-26 – 2020-11-28 (×7): 100 mg via ORAL
  Filled 2020-11-25 (×7): qty 2

## 2020-11-25 MED ORDER — INSULIN ASPART 100 UNIT/ML ~~LOC~~ SOLN
0.0000 [IU] | Freq: Three times a day (TID) | SUBCUTANEOUS | Status: DC
  Administered 2020-11-26 (×2): 1 [IU] via SUBCUTANEOUS
  Administered 2020-11-26: 3 [IU] via SUBCUTANEOUS
  Administered 2020-11-27: 2 [IU] via SUBCUTANEOUS
  Administered 2020-11-27: 3 [IU] via SUBCUTANEOUS
  Administered 2020-11-27: 5 [IU] via SUBCUTANEOUS
  Administered 2020-11-28: 2 [IU] via SUBCUTANEOUS

## 2020-11-25 MED ORDER — ACETAMINOPHEN 650 MG RE SUPP: 650.0000 mg | Freq: Four times a day (QID) | RECTAL | Status: DC | PRN

## 2020-11-25 MED ORDER — INSULIN GLARGINE 100 UNIT/ML ~~LOC~~ SOLN
5.0000 [IU] | Freq: Every day | SUBCUTANEOUS | Status: DC
  Administered 2020-11-26 (×2): 5 [IU] via SUBCUTANEOUS
  Filled 2020-11-25 (×3): qty 0.05

## 2020-11-25 MED ORDER — FUROSEMIDE 10 MG/ML IJ SOLN
40.0000 mg | Freq: Once | INTRAMUSCULAR | Status: AC
  Administered 2020-11-26: 40 mg via INTRAVENOUS
  Filled 2020-11-25: qty 4

## 2020-11-25 MED ORDER — LORAZEPAM 0.5 MG PO TABS: 0.5000 mg | ORAL_TABLET | ORAL | Status: DC | PRN

## 2020-11-25 MED ORDER — VANCOMYCIN HCL 1500 MG/300ML IV SOLN
1500.0000 mg | Freq: Once | INTRAVENOUS | Status: AC
  Administered 2020-11-26: 1500 mg via INTRAVENOUS
  Filled 2020-11-25: qty 300

## 2020-11-25 MED ORDER — SODIUM CHLORIDE 0.9 % IV SOLN
2.0000 g | Freq: Once | INTRAVENOUS | Status: AC
  Administered 2020-11-26: 2 g via INTRAVENOUS
  Filled 2020-11-25: qty 2

## 2020-11-25 MED ORDER — ENSURE ENLIVE PO LIQD
237.0000 mL | Freq: Two times a day (BID) | ORAL | Status: DC
  Administered 2020-11-26 – 2020-11-28 (×4): 237 mL via ORAL
  Filled 2020-11-25: qty 237

## 2020-11-25 MED ORDER — FUROSEMIDE 40 MG PO TABS
40.0000 mg | ORAL_TABLET | Freq: Two times a day (BID) | ORAL | Status: DC
  Administered 2020-11-26 – 2020-11-28 (×5): 40 mg via ORAL
  Filled 2020-11-25 (×3): qty 1
  Filled 2020-11-25: qty 2
  Filled 2020-11-25: qty 1

## 2020-11-25 MED ORDER — HEPARIN SODIUM (PORCINE) 5000 UNIT/ML IJ SOLN
5000.0000 [IU] | Freq: Three times a day (TID) | INTRAMUSCULAR | Status: DC
  Administered 2020-11-26 – 2020-11-28 (×6): 5000 [IU] via SUBCUTANEOUS
  Filled 2020-11-25 (×6): qty 1

## 2020-11-25 MED ORDER — DOCUSATE SODIUM 100 MG PO CAPS
100.0000 mg | ORAL_CAPSULE | Freq: Two times a day (BID) | ORAL | Status: DC
  Administered 2020-11-26 – 2020-11-28 (×3): 100 mg via ORAL
  Filled 2020-11-25 (×5): qty 1

## 2020-11-25 MED ORDER — VITAMIN B-12 1000 MCG PO TABS
1000.0000 ug | ORAL_TABLET | Freq: Every day | ORAL | Status: DC
  Administered 2020-11-26 – 2020-11-28 (×3): 1000 ug via ORAL
  Filled 2020-11-25 (×3): qty 1

## 2020-11-25 NOTE — ED Notes (Signed)
[name], sister, [phone] would like an update when available

## 2020-11-25 NOTE — ED Notes (Signed)
Patient transported to MRI 

## 2020-11-25 NOTE — ED Triage Notes (Addendum)
Pt to triage via [facility] from home.  Family reports slurred speech x 3 days.  Also reports decline in mental status x 1 week.  Pt has had multiple falls over the past few days. No blood thinners.  Pt has eyes closed.  He will respond to voice.  Denies pain.  CBG 400.

## 2020-11-25 NOTE — ED Notes (Signed)
bair hugger applied on pt

## 2020-11-25 NOTE — H&P (Signed)
History and Physical    [name] [name] GEX:[id number] DOB: 1946-04-02 DOA: 11/25/2020  PCP: Libby Maw, MD  Patient coming from: Home.  History obtained from ER physician, patient's daughter.  Chief Complaint: Appears cold and also slurred speech and shortness of breath.  HPI: [name] [name] is a 75 y.o. male with chronic kidney disease stage IV with chronic anemia, diabetes mellitus, hypertension was noticed to have slurred speech and shortness of breath over the last 24 hours.  Patient also was feeling cold after cold exposure.  Did not have any nausea vomiting diarrhea chest pain productive cough.  ED Course: In the ER patient was hypothermic with temperature of 93 Fahrenheit was placed on [facility].  CT head was unremarkable.  Patient was empirically started on antibiotics for possible sepsis due to hypothermia protocol was obtained.  Patient also was noted to have slurred speech for which MRI brain was ordered.  Due to shortness of breath chest x-ray shows pleural effusion similar to last picture was given Lasix 40 mg IV admitted for further observation.  Labs are largely at baseline.  Covid test negative.  Review of Systems: As per HPI, rest all negative.   Past Medical History:  Diagnosis Date  . 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 Closed comminuted intertrochanteric fracture of left femur ([place])   . Diabetes mellitus without complication ([place])   . Diabetic hyperosmolar non-ketotic state ([place]) 05/15/2016  . DKA (diabetic ketoacidoses) 05/09/2016  . Hypertension   . Hypoglycemia 11/29/2019  . Postoperative anemia due to acute blood loss 06/18/2016  . Retroperitoneal hematoma 06/18/2016  . Stroke ([place])   . Vitamin B 12 deficiency 06/18/2016    Past Surgical History:  Procedure Laterality Date  . HIP ARTHROPLASTY Right 02/05/2013   Procedure: ARTHROPLASTY BIPOLAR HIP;  Surgeon: [name], MD;  Location: [facility];   Service: Orthopedics;  Laterality: Right;  . INTRAMEDULLARY (IM) NAIL INTERTROCHANTERIC Left 06/16/2016   Procedure: INTRAMEDULLARY (IM) NAIL INTERTROCHANTRIC;  Surgeon: Rod Can, MD;  Location: [place];  Service: Orthopedics;  Laterality: Left;     reports that he has never smoked. He has never used smokeless tobacco. He reports that he does not drink alcohol and does not use drugs.  No Known Allergies  Family History  Problem Relation Age of Onset  . Alzheimer's disease Mother   . Hypertension Mother   . Hyperlipidemia Father   . Hypertension Father   . Healthy Maternal Grandmother   . Pneumonia Maternal Grandfather     Prior to Admission medications   Medication Sig Start Date End Date Taking? Authorizing Provider  ACCU-CHEK GUIDE test strip USE AS DIRECTED 06/12/20   Azzie Glatter, [name]  acetaminophen (TYLENOL) 325 MG tablet Take 2 tablets (650 mg total) by mouth every 6 (six) hours as needed for up to 30 doses for mild pain or moderate pain. 07/06/20   Wyvonnia Dusky, MD  amLODipine (NORVASC) 10 MG tablet Take 1 tablet (10 mg total) by mouth daily. 06/26/20   Libby Maw, MD  Blood Glucose Monitoring Suppl (ACCU-CHEK GUIDE) w/Device KIT 1 Device by Does not apply route QID. 11/02/19   Azzie Glatter, [name]  calcium acetate (PHOSLO) 667 MG capsule Take 2 capsules (1,334 mg total) by mouth 3 (three) times daily with meals. 10/22/20   [name], [name], DO  docusate sodium (COLACE) 100 MG capsule Take 1 capsule (100 mg total) by mouth 2 (two) times daily.  10/22/20   [name], [name], DO  feeding supplement (ENSURE ENLIVE / ENSURE PLUS) LIQD Take 237 mLs by mouth 2 (two) times daily between meals. 10/23/20   [name], [name], DO  furosemide (LASIX) 40 MG tablet Take 40 mg by mouth 2 (two) times daily. 07/30/20   [provider]  glucose blood (ACCU-CHEK GUIDE) test strip Use as instructed 11/03/18   Azzie Glatter, [name]  glucose blood (TRUE METRIX BLOOD GLUCOSE TEST) test strip  CHECK BLOOD SUGAR UP TO 4 TIMES A DAY. Patient taking differently: 1 each by Other route See admin instructions. CHECK BLOOD SUGAR UP TO 2 TIMES A DAY. 05/25/18   Azzie Glatter, [name]  hydrALAZINE (APRESOLINE) 100 MG tablet TAKE 1 TABLET BY MOUTH 3 TIMES A DAY. 07/30/20   Libby Maw, MD  hyoscyamine (LEVSIN SL) 0.125 MG SL tablet Place 1 tablet (0.125 mg total) under the tongue every 4 (four) hours as needed (excess oral secretions). 10/22/20   [name], [name], DO  Insulin Glargine (BASAGLAR KWIKPEN) 100 UNIT/ML Inject 5 Units into the skin at bedtime. 08/20/20   [name], DO  insulin lispro (HUMALOG KWIKPEN) 100 UNIT/ML KwikPen Inject 0-10 Units into the skin 3 (three) times daily as needed (blood sugar). Max daily 30 units 08/20/20   [name], DO  Insulin Pen Needle 32G X 4 MM MISC 1 Device by Does not apply route in the morning, at noon, in the evening, and at bedtime. 01/03/20   [name], [name], MD  Insulin Syringe-Needle U-100 (BD INSULIN SYRINGE U/F) 31G X 5/16" 0.3 ML MISC Inject 1 each as directed 2 (two) times daily. as directed 11/02/19   Azzie Glatter, [name]  levothyroxine (SYNTHROID) 25 MCG tablet TAKE 1 TABLET EACH DAY. Patient taking differently: Take 25 mcg by mouth daily. 09/27/20   [name], [name]  LORazepam (ATIVAN) 0.5 MG tablet Take 1 tablet (0.5 mg total) by mouth every 4 (four) hours as needed for anxiety. May crush, mix with water and give sublingually if needed. 10/22/20   [name], [name], DO  oxyCODONE (OXY IR/ROXICODONE) 5 MG immediate release tablet Take 1 tablet (5 mg total) by mouth every 4 (four) hours as needed for severe pain. May crush, mix with water and give sublingually if needed. 10/22/20   [name], [name], DO  prochlorperazine (COMPAZINE) 10 MG tablet Take 1 tablet (10 mg total) by mouth every 4 (four) hours as needed for nausea or vomiting. May crush, mix with water and give sublingually. 10/22/20   [name], [name], DO  sodium bicarbonate 650  MG tablet Take 1 tablet (650 mg total) by mouth 2 (two) times daily. 04/19/20   [name], MD  vitamin B-12 1000 MCG tablet Take 1 tablet (1,000 mcg total) by mouth daily. 12/05/19   [name], MD    Physical Exam: Constitutional: Moderately built and nourished. Vitals:   11/25/20 2200 11/25/20 2215 11/25/20 2230 11/25/20 2245  BP: 131/76 124/79 132/68   Pulse: 70 77 72 73  Resp: (!) 24 (!) 22 (!) 23 (!) 22  Temp: (!) 94.7 F (34.8 C) (!) 95 F (35 C) (!) 95.3 F (35.2 C) (!) 95.5 F (35.3 C)  TempSrc:      SpO2: 98% 96% 98% 97%   Eyes: Anicteric no pallor. ENMT: No discharge from the ears eyes nose or mouth. Neck: No mass felt.  No neck rigidity. Respiratory: No rhonchi or crepitations. Cardiovascular: S1-S2 heard. Abdomen: Soft nontender bowel sounds present. Musculoskeletal:  Bilateral lower extremity edema present. Skin: No rash. Neurologic: Alert awake oriented to his name appears confused slurred speech moving all extremities. Psychiatric: Appears confused.   Labs on Admission: I have personally reviewed following labs and imaging studies  CBC: Recent Labs  Lab 11/25/20 1807 11/25/20 1910  WBC 3.9*  --   NEUTROABS 2.4  --   HGB 7.0* 8.2*  7.8*  HCT 23.0* 24.0*  23.0*  MCV 97.0  --   PLT 150  --    Basic Metabolic Panel: Recent Labs  Lab 11/25/20 1807 11/25/20 1910  NA 145 145  145  K 3.2* 3.3*  3.3*  CL 104 103  CO2 27  --   GLUCOSE 274* 251*  BUN 80* 81*  CREATININE 7.35* 7.30*  CALCIUM 7.1*  --    GFR: CrCl cannot be calculated (Unknown ideal weight.). Liver Function Tests: Recent Labs  Lab 11/25/20 1807  AST 28  ALT 14  ALKPHOS 61  BILITOT 0.5  PROT 5.8*  ALBUMIN 2.5*   No results for input(s): LIPASE, AMYLASE in the last 168 hours. Recent Labs  Lab 11/25/20 1855  AMMONIA 16   Coagulation Profile: Recent Labs  Lab 11/25/20 1855  INR 1.3*   Cardiac Enzymes: No results for input(s): CKTOTAL, CKMB, CKMBINDEX,  TROPONINI in the last 168 hours. BNP (last 3 results) No results for input(s): PROBNP in the last 8760 hours. HbA1C: No results for input(s): HGBA1C in the last 72 hours. CBG: No results for input(s): GLUCAP in the last 168 hours. Lipid Profile: No results for input(s): CHOL, HDL, LDLCALC, TRIG, CHOLHDL, LDLDIRECT in the last 72 hours. Thyroid Function Tests: No results for input(s): TSH, T4TOTAL, FREET4, T3FREE, THYROIDAB in the last 72 hours. Anemia Panel: No results for input(s): VITAMINB12, FOLATE, FERRITIN, TIBC, IRON, RETICCTPCT in the last 72 hours. Urine analysis:    Component Value Date/Time   COLORURINE YELLOW 11/25/2020 2109   APPEARANCEUR CLEAR 11/25/2020 2109   LABSPEC 1.020 11/25/2020 2109   PHURINE 8.0 11/25/2020 2109   GLUCOSEU >=500 (A) 11/25/2020 2109   GLUCOSEU >=1000 (A) 12/08/2019 1139   HGBUR NEGATIVE 11/25/2020 2109   BILIRUBINUR NEGATIVE 11/25/2020 2109   BILIRUBINUR Negative 11/09/2019 0910   KETONESUR NEGATIVE 11/25/2020 2109   PROTEINUR >300 (A) 11/25/2020 2109   UROBILINOGEN 0.2 12/08/2019 1139   NITRITE NEGATIVE 11/25/2020 2109   LEUKOCYTESUR NEGATIVE 11/25/2020 2109   Sepsis Labs: _0 (procalcitonin:4,lacticidven:4) ) Recent Results (from the past 240 hour(s))  Resp Panel by RT-PCR (Flu A&B, Covid) Nasopharyngeal Swab     Status: None   Collection Time: 11/25/20  6:55 PM   Specimen: Nasopharyngeal Swab; Nasopharyngeal(NP) swabs in vial transport medium  Result Value Ref Range Status   SARS Coronavirus 2 by RT PCR NEGATIVE NEGATIVE Final    Comment: (NOTE) SARS-CoV-2 target nucleic acids are NOT DETECTED.  The SARS-CoV-2 RNA is generally detectable in upper respiratory specimens during the acute phase of infection. The lowest concentration of SARS-CoV-2 viral copies this assay can detect is 138 copies/mL. A negative result does not preclude SARS-Cov-2 infection and should not be used as the sole basis for treatment or other patient  management decisions. A negative result may occur with  improper specimen collection/handling, submission of specimen other than nasopharyngeal swab, presence of viral mutation(s) within the areas targeted by this assay, and inadequate number of viral copies(<138 copies/mL). A negative result must be combined with clinical observations, patient history, and epidemiological information. The expected result is Negative.  Fact Sheet  for Patients:  EntrepreneurPulse.com.au  Fact Sheet for Healthcare Providers:  IncredibleEmployment.be  This test is no t yet approved or cleared by the [place] FDA and  has been authorized for detection and/or diagnosis of SARS-CoV-2 by FDA under an Emergency Use Authorization (EUA). This EUA will remain  in effect (meaning this test can be used) for the duration of the COVID-19 declaration under Section 564(b)(1) of the Act, 21 U.S.C.section 360bbb-3(b)(1), unless the authorization is terminated  or revoked sooner.       Influenza A by PCR NEGATIVE NEGATIVE Final   Influenza B by PCR NEGATIVE NEGATIVE Final    Comment: (NOTE) The Xpert Xpress SARS-CoV-2/FLU/RSV plus assay is intended as an aid in the diagnosis of influenza from Nasopharyngeal swab specimens and should not be used as a sole basis for treatment. Nasal washings and aspirates are unacceptable for Xpert Xpress SARS-CoV-2/FLU/RSV testing.  Fact Sheet for Patients: EntrepreneurPulse.com.au  Fact Sheet for Healthcare Providers: IncredibleEmployment.be  This test is not yet approved or cleared by the [place] FDA and has been authorized for detection and/or diagnosis of SARS-CoV-2 by FDA under an Emergency Use Authorization (EUA). This EUA will remain in effect (meaning this test can be used) for the duration of the COVID-19 declaration under Section 564(b)(1) of the Act, 21 U.S.C. section 360bbb-3(b)(1),  unless the authorization is terminated or revoked.  Performed at [facility], [place] [address] [place], [place] [address]      Radiological Exams on Admission: CT HEAD WO CONTRAST  Result Date: 11/25/2020 CLINICAL DATA:  Slurred speech for 3 days. Decline in mental status for 1 week. Multiple falls. EXAM: CT HEAD WITHOUT CONTRAST TECHNIQUE: Contiguous axial images were obtained from the base of the skull through the vertex without intravenous contrast. COMPARISON:  10/18/2020 FINDINGS: Brain: Small single remote right and left lacunar infarcts in the corona radiata bilaterally. Periventricular white matter and corona radiata hypodensities favor chronic ischemic microvascular white matter disease. Otherwise, the brainstem, cerebellum, cerebral peduncles, thalamus, basal ganglia, basilar cisterns, and ventricular system appear within normal limits. No intracranial hemorrhage, mass lesion, or acute CVA. Vascular: There is atherosclerotic calcification of the cavernous carotid arteries bilaterally. Skull: Unremarkable Sinuses/Orbits: Chronic left maxillary sinusitis. Left mastoid effusion. Mild chronic right sphenoid sinusitis. Other: No supplemental non-categorized findings. IMPRESSION: 1. No acute intracranial findings. 2. Periventricular white matter and corona radiata hypodensities favor chronic ischemic microvascular white matter disease. 3. Small single chronic/remote lacunar infarcts in the corona radiata bilaterally. 4. Chronic left maxillary sinusitis. Left mastoid effusion. Mild chronic right sphenoid sinusitis. Electronically Signed   By: Van Clines M.D.   On: 11/25/2020 18:55   DG Chest Portable 1 View  Result Date: 11/25/2020 CLINICAL DATA:  Altered mental status.  Slurred speech for 3 days. EXAM: PORTABLE CHEST 1 VIEW COMPARISON:  10/18/2020 FINDINGS: Airspace opacities are noted the right extending from the perihilar region to the lung base obscuring hemidiaphragm. There is  additional opacity at the left lung base, mostly retrocardiac. Remainder of the lungs is clear. Right greater than left pleural effusions. No evidence of a pneumothorax. Cardiac silhouette is normal in size. No mediastinal or hilar masses. Skeletal structures are grossly intact. IMPRESSION: 1. Right greater than left pleural effusions associated with lung base opacity, consistent with atelectasis, as noted on the prior chest CT. No convincing pneumonia and no evidence of pulmonary edema. Overall without significant change from the prior chest radiograph/chest CT. Electronically Signed   By: Dedra Skeens.D.  On: 11/25/2020 19:35    EKG: Independently reviewed.  Normal sinus rhythm.  Assessment/Plan Principal Problem:   Hypothermia Active Problems:   Anemia of chronic disease   Uncontrolled type 2 diabetes mellitus with diabetic nephropathy, with long-term current use of insulin ([name])   CKD (chronic kidney disease), stage V ([name])   Dementia ([name])   CKD (chronic kidney disease) stage 4, GFR 15-29 ml/min ([name])   Hypothyroidism   Fluid overload    1. Hypothermia could be from cold exposure.  For now we will keep patient on empiric antibiotics and will get blood culture check TSH and cortisol levels.  Follow blood cultures. 2. Fluid overload with history of chronic kidney disease stage IV 1 dose of IV Lasix was given in the ER.  We will continue with home dose of Lasix orally follow respiratory status.  Patient is a DNR. 3. Slurred speech will check MRI brain.  Had a similar picture at presentation last month. 4. Hypertension on Norvasc and hydralazine. 5. Hypothyroidism on Synthroid. 6. Diabetes mellitus we will continue home dose of insulin. 7. Chronic anemia likely from renal disease follow CBC.   DVT prophylaxis: Heparin. Code Status: DNR confirmed with patient's daughter. Family Communication: Patient's daughter. Disposition Plan: Home. Consults called: None. Admission status:  Observation.   Rise Patience MD [facility] Pager [phone].  If 7PM-7AM, please contact night-coverage [email or web address] Password [facility]  11/25/2020, 10:46 PM

## 2020-11-25 NOTE — ED Provider Notes (Signed)
[facility] EMERGENCY DEPARTMENT Provider Note   CSN: [id number] Arrival date & time: 11/25/20  1743     History Chief Complaint  Patient presents with  . Altered Mental Status    [name] [name] is a 75 y.o. male.  Family member states that patient has been more confused and weak.  Patient however overall states that he feels fine except for that he feels really cold.  The history is provided by the patient.  Altered Mental Status Presenting symptoms: confusion and lethargy   Severity:  Mild Episode history:  Continuous Timing:  Constant Chronicity:  New Associated symptoms: no abdominal pain, no fever, no palpitations, no rash, no seizures and no vomiting        Past Medical History:  Diagnosis Date  . 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 Closed comminuted intertrochanteric fracture of left femur ([place])   . Diabetes mellitus without complication ([place])   . Diabetic hyperosmolar non-ketotic state ([place]) 05/15/2016  . DKA (diabetic ketoacidoses) 05/09/2016  . Hypertension   . Hypoglycemia 11/29/2019  . Postoperative anemia due to acute blood loss 06/18/2016  . Retroperitoneal hematoma 06/18/2016  . Stroke ([place])   . Vitamin B 12 deficiency 06/18/2016    Patient Active Problem List   Diagnosis Date Noted  . Hypothermia 11/25/2020  . Aspiration, chronic pulmonary 10/19/2020  . Volume overload 10/19/2020  . Fluid overload 10/18/2020  . Prolonged QT interval 10/18/2020  . Palliative care by specialist   . Acute encephalopathy 08/18/2020  . Aspiration pneumonia of right lower lobe ([place]) 08/18/2020  . AKI (acute kidney injury) ([place]) 08/15/2020  . Type 1 diabetes mellitus with hyperglycemia ([place]) 07/27/2020  . 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 Diabetes mellitus type I ([place]) 07/27/2020  . Thrombocytopenia ([place]) 04/23/2020  . Hypothyroidism 04/22/2020  . DKA  (diabetic ketoacidoses) 04/14/2020  . PVD (peripheral vascular disease) ([place]) 02/02/2020  . Poor social situation 12/08/2019  . CKD (chronic kidney disease) stage 4, GFR 15-29 ml/min ([name]) 12/08/2019  . Cognitive decline 11/22/2019  . ETOH abuse 05/17/2019  . Chronic diastolic CHF (congestive heart failure) ([place]) 05/17/2019  . Uncontrolled type 2 diabetes mellitus with hyperglycemia, with long-term current use of insulin ([place]) 10/03/2018  . Underweight 11/18/2017  . Anemia 11/18/2017  . History of cerebrovascular accident (CVA) in adulthood 07/25/2017  . Depression with anxiety 07/25/2017  . Left ventricular diastolic dysfunction 68/09/7516  . Dementia ([place]) 07/25/2017  . Brittle diabetes mellitus ([place]) 06/28/2017  . Hyperlipidemia associated with type 2 diabetes mellitus ([place]) 06/27/2017  . Hypertension associated with diabetes ([place]) 06/27/2017  . Acute metabolic encephalopathy 00/17/4944  . Weakness 11/16/2016  . CKD (chronic kidney disease), stage V ([place])   . Noncompliance with medication regimen 08/17/2016  . History of DVT (deep vein thrombosis) 07/06/2016  . Vitamin B 12 deficiency 06/18/2016  . Hyperglycemia 05/15/2016  . Hyponatremia 05/15/2016  . Anxiety 05/15/2016  . Narcotic dependency, continuous ([place]) 11/02/2015  . Uncontrolled type 2 diabetes mellitus with diabetic nephropathy, with long-term current use of insulin ([place]) 08/12/2015  . Anemia of chronic disease 02/02/2013    Past Surgical History:  Procedure Laterality Date  . HIP ARTHROPLASTY Right 02/05/2013   Procedure: ARTHROPLASTY BIPOLAR HIP;  Surgeon: [name], MD;  Location: [facility];  Service: Orthopedics;  Laterality: Right;  . INTRAMEDULLARY (IM) NAIL INTERTROCHANTERIC Left 06/16/2016   Procedure: INTRAMEDULLARY (IM) NAIL INTERTROCHANTRIC;  Surgeon:  Rod Can, MD;  Location: [facility];  Service: Orthopedics;  Laterality: Left;       Family History  Problem Relation Age of Onset  . Alzheimer's  disease Mother   . Hypertension Mother   . Hyperlipidemia Father   . Hypertension Father   . Healthy Maternal Grandmother   . Pneumonia Maternal Grandfather     Social History   Tobacco Use  . Smoking status: Never Smoker  . Smokeless tobacco: Never Used  Vaping Use  . Vaping Use: Never used  Substance Use Topics  . Alcohol use: No  . Drug use: No    Home Medications Prior to Admission medications   Medication Sig Start Date End Date Taking? Authorizing Provider  ACCU-CHEK GUIDE test strip USE AS DIRECTED 06/12/20   Azzie Glatter, [name]  acetaminophen (TYLENOL) 325 MG tablet Take 2 tablets (650 mg total) by mouth every 6 (six) hours as needed for up to 30 doses for mild pain or moderate pain. 07/06/20   Wyvonnia Dusky, MD  amLODipine (NORVASC) 10 MG tablet Take 1 tablet (10 mg total) by mouth daily. 06/26/20   Libby Maw, MD  Blood Glucose Monitoring Suppl (ACCU-CHEK GUIDE) w/Device KIT 1 Device by Does not apply route QID. 11/02/19   Azzie Glatter, [name]  calcium acetate (PHOSLO) 667 MG capsule Take 2 capsules (1,334 mg total) by mouth 3 (three) times daily with meals. 10/22/20   [name], [name], DO  docusate sodium (COLACE) 100 MG capsule Take 1 capsule (100 mg total) by mouth 2 (two) times daily. 10/22/20   [name], [name], DO  feeding supplement (ENSURE ENLIVE / ENSURE PLUS) LIQD Take 237 mLs by mouth 2 (two) times daily between meals. 10/23/20   [name], [name], DO  furosemide (LASIX) 40 MG tablet Take 40 mg by mouth 2 (two) times daily. 07/30/20   [provider]  glucose blood (ACCU-CHEK GUIDE) test strip Use as instructed 11/03/18   Azzie Glatter, [name]  glucose blood (TRUE METRIX BLOOD GLUCOSE TEST) test strip CHECK BLOOD SUGAR UP TO 4 TIMES A DAY. Patient taking differently: 1 each by Other route See admin instructions. CHECK BLOOD SUGAR UP TO 2 TIMES A DAY. 05/25/18   Azzie Glatter, [name]  hydrALAZINE (APRESOLINE) 100 MG tablet TAKE 1 TABLET BY MOUTH 3 TIMES A  DAY. 07/30/20   Libby Maw, MD  hyoscyamine (LEVSIN SL) 0.125 MG SL tablet Place 1 tablet (0.125 mg total) under the tongue every 4 (four) hours as needed (excess oral secretions). 10/22/20   [name], [name], DO  Insulin Glargine (BASAGLAR KWIKPEN) 100 UNIT/ML Inject 5 Units into the skin at bedtime. 08/20/20   [name], DO  insulin lispro (HUMALOG KWIKPEN) 100 UNIT/ML KwikPen Inject 0-10 Units into the skin 3 (three) times daily as needed (blood sugar). Max daily 30 units 08/20/20   [name], DO  Insulin Pen Needle 32G X 4 MM MISC 1 Device by Does not apply route in the morning, at noon, in the evening, and at bedtime. 01/03/20   [name], [name], MD  Insulin Syringe-Needle U-100 (BD INSULIN SYRINGE U/F) 31G X 5/16" 0.3 ML MISC Inject 1 each as directed 2 (two) times daily. as directed 11/02/19   Azzie Glatter, [name]  levothyroxine (SYNTHROID) 25 MCG tablet TAKE 1 TABLET EACH DAY. Patient taking differently: Take 25 mcg by mouth daily. 09/27/20   [name], [name]  LORazepam (ATIVAN) 0.5 MG tablet Take 1 tablet (0.5 mg  total) by mouth every 4 (four) hours as needed for anxiety. May crush, mix with water and give sublingually if needed. 10/22/20   [name], [name], DO  oxyCODONE (OXY IR/ROXICODONE) 5 MG immediate release tablet Take 1 tablet (5 mg total) by mouth every 4 (four) hours as needed for severe pain. May crush, mix with water and give sublingually if needed. 10/22/20   [name], [name], DO  prochlorperazine (COMPAZINE) 10 MG tablet Take 1 tablet (10 mg total) by mouth every 4 (four) hours as needed for nausea or vomiting. May crush, mix with water and give sublingually. 10/22/20   [name], [name], DO  sodium bicarbonate 650 MG tablet Take 1 tablet (650 mg total) by mouth 2 (two) times daily. 04/19/20   [name], MD  vitamin B-12 1000 MCG tablet Take 1 tablet (1,000 mcg total) by mouth daily. 12/05/19   [name], MD    Allergies    Patient has no known  allergies.  Review of Systems   Review of Systems  Constitutional: Positive for chills. Negative for fever.  HENT: Negative for ear pain and sore throat.   Eyes: Negative for pain and visual disturbance.  Respiratory: Negative for cough and shortness of breath.   Cardiovascular: Negative for chest pain and palpitations.  Gastrointestinal: Negative for abdominal pain and vomiting.  Genitourinary: Negative for dysuria and hematuria.  Musculoskeletal: Negative for arthralgias and back pain.  Skin: Negative for color change and rash.  Neurological: Negative for seizures and syncope.  Psychiatric/Behavioral: Positive for confusion.  All other systems reviewed and are negative.   Physical Exam Updated Vital Signs BP 124/79   Pulse 77   Temp (!) 95 F (35 C)   Resp (!) 22   SpO2 96%   Physical Exam Vitals and nursing note reviewed.  Constitutional:      General: He is not in acute distress.    Appearance: He is well-developed and well-nourished. He is ill-appearing (chronic).  HENT:     Head: Normocephalic and atraumatic.     Mouth/Throat:     Mouth: Mucous membranes are dry.  Eyes:     Extraocular Movements: Extraocular movements intact.     Conjunctiva/sclera: Conjunctivae normal.     Pupils: Pupils are equal, round, and reactive to light.  Cardiovascular:     Rate and Rhythm: Normal rate and regular rhythm.     Pulses: Normal pulses.     Heart sounds: Normal heart sounds. No murmur heard.   Pulmonary:     Effort: Pulmonary effort is normal. No respiratory distress.     Breath sounds: Normal breath sounds.  Abdominal:     Palpations: Abdomen is soft.     Tenderness: There is no abdominal tenderness.  Musculoskeletal:     Cervical back: Neck supple.     Right lower leg: Edema present.     Left lower leg: Edema present.  Skin:    General: Skin is warm and dry.     Capillary Refill: Capillary refill takes less than 2 seconds.  Neurological:     General: No focal  deficit present.     Mental Status: He is alert.     Cranial Nerves: No cranial nerve deficit.     Motor: No weakness.     Comments: Moves all extremities, answers questions appropriately   Psychiatric:        Mood and Affect: Mood and affect normal.     ED Results / Procedures / Treatments   Labs (all  labs ordered are listed, but only abnormal results are displayed) Labs Reviewed  CBC - Abnormal; Notable for the following components:      Result Value   WBC 3.9 (*)    RBC 2.37 (*)    Hemoglobin 7.0 (*)    HCT 23.0 (*)    All other components within normal limits  COMPREHENSIVE METABOLIC PANEL - Abnormal; Notable for the following components:   Potassium 3.2 (*)    Glucose, Bld 274 (*)    BUN 80 (*)    Creatinine, Ser 7.35 (*)    Calcium 7.1 (*)    Total Protein 5.8 (*)    Albumin 2.5 (*)    GFR, Estimated 7 (*)    All other components within normal limits  URINALYSIS, ROUTINE W REFLEX MICROSCOPIC - Abnormal; Notable for the following components:   Glucose, UA >=500 (*)    Protein, ur >300 (*)    All other components within normal limits  BRAIN NATRIURETIC PEPTIDE - Abnormal; Notable for the following components:   B Natriuretic Peptide 1,100.2 (*)    All other components within normal limits  LACTIC ACID, PLASMA - Abnormal; Notable for the following components:   Lactic Acid, Venous 2.0 (*)    All other components within normal limits  PROTIME-INR - Abnormal; Notable for the following components:   Prothrombin Time 15.4 (*)    INR 1.3 (*)    All other components within normal limits  APTT - Abnormal; Notable for the following components:   aPTT 39 (*)    All other components within normal limits  URINALYSIS, MICROSCOPIC (REFLEX) - Abnormal; Notable for the following components:   Bacteria, UA RARE (*)    All other components within normal limits  I-STAT CHEM 8, ED - Abnormal; Notable for the following components:   Potassium 3.3 (*)    BUN 81 (*)    Creatinine,  Ser 7.30 (*)    Glucose, Bld 251 (*)    Calcium, Ion 0.87 (*)    Hemoglobin 8.2 (*)    HCT 24.0 (*)    All other components within normal limits  I-STAT VENOUS BLOOD GAS, ED - Abnormal; Notable for the following components:   pH, Ven 7.451 (*)    pO2, Ven 133.0 (*)    Bicarbonate 31.2 (*)    TCO2 33 (*)    Acid-Base Excess 7.0 (*)    Potassium 3.3 (*)    Calcium, Ion 0.91 (*)    HCT 23.0 (*)    Hemoglobin 7.8 (*)    All other components within normal limits  TROPONIN I (HIGH SENSITIVITY) - Abnormal; Notable for the following components:   Troponin I (High Sensitivity) 51 (*)    All other components within normal limits  RESP PANEL BY RT-PCR (FLU A&B, COVID) ARPGX2  CULTURE, BLOOD (SINGLE)  DIFFERENTIAL  AMMONIA  LACTIC ACID, PLASMA  ETHANOL  POC OCCULT BLOOD, ED  TYPE AND SCREEN    EKG EKG Interpretation  Date/Time:  Sunday November 25 2020 19:01:43 EST Ventricular Rate:  67 PR Interval:    QRS Duration: 105 QT Interval:  496 QTC Calculation: 524 R Axis:   6 Text Interpretation: Sinus rhythm Anteroseptal infarct, old Nonspecific T abnormalities, lateral leads Prolonged QT interval Confirmed by Lennice Sites [phone]) on 11/25/2020 7:13:01 PM   Radiology CT HEAD WO CONTRAST  Result Date: 11/25/2020 CLINICAL DATA:  Slurred speech for 3 days. Decline in mental status for 1 week. Multiple falls. EXAM: CT HEAD WITHOUT  CONTRAST TECHNIQUE: Contiguous axial images were obtained from the base of the skull through the vertex without intravenous contrast. COMPARISON:  10/18/2020 FINDINGS: Brain: Small single remote right and left lacunar infarcts in the corona radiata bilaterally. Periventricular white matter and corona radiata hypodensities favor chronic ischemic microvascular white matter disease. Otherwise, the brainstem, cerebellum, cerebral peduncles, thalamus, basal ganglia, basilar cisterns, and ventricular system appear within normal limits. No intracranial hemorrhage, mass  lesion, or acute CVA. Vascular: There is atherosclerotic calcification of the cavernous carotid arteries bilaterally. Skull: Unremarkable Sinuses/Orbits: Chronic left maxillary sinusitis. Left mastoid effusion. Mild chronic right sphenoid sinusitis. Other: No supplemental non-categorized findings. IMPRESSION: 1. No acute intracranial findings. 2. Periventricular white matter and corona radiata hypodensities favor chronic ischemic microvascular white matter disease. 3. Small single chronic/remote lacunar infarcts in the corona radiata bilaterally. 4. Chronic left maxillary sinusitis. Left mastoid effusion. Mild chronic right sphenoid sinusitis. Electronically Signed   By: Van Clines M.D.   On: 11/25/2020 18:55   DG Chest Portable 1 View  Result Date: 11/25/2020 CLINICAL DATA:  Altered mental status.  Slurred speech for 3 days. EXAM: PORTABLE CHEST 1 VIEW COMPARISON:  10/18/2020 FINDINGS: Airspace opacities are noted the right extending from the perihilar region to the lung base obscuring hemidiaphragm. There is additional opacity at the left lung base, mostly retrocardiac. Remainder of the lungs is clear. Right greater than left pleural effusions. No evidence of a pneumothorax. Cardiac silhouette is normal in size. No mediastinal or hilar masses. Skeletal structures are grossly intact. IMPRESSION: 1. Right greater than left pleural effusions associated with lung base opacity, consistent with atelectasis, as noted on the prior chest CT. No convincing pneumonia and no evidence of pulmonary edema. Overall without significant change from the prior chest radiograph/chest CT. Electronically Signed   By: Lajean Manes M.D.   On: 11/25/2020 19:35    Procedures Procedures   Medications Ordered in ED Medications  sodium chloride flush (NS) 0.9 % injection 3 mL (has no administration in time range)  furosemide (LASIX) injection 40 mg (has no administration in time range)    ED Course  I have reviewed  the triage vital signs and the nursing notes.  Pertinent labs & imaging results that were available during my care of the patient were reviewed by me and considered in my medical decision making (see chart for details).    MDM Rules/Calculators/A&P                          Azarias Chiou is a 75 year old male with history of diabetes, end-stage renal disease on hemodialysis who presents the ED with weakness, shortness of breath.  Patient hypothermic to 93 rectally.  Otherwise normal vitals.  Family states that he has been more short of breath recently.  They have increased Lasix but still short of breath.  Has been having a harder time walking.  I asked about the hypothermia and they state that they gave him a bath tonight and left him in her room with the window open and I suspect that he has cold exposure.  However infectious work-up was initiated.  Neurologically he appears intact.  Does not have any cough or pain with urination.  Chest x-ray consistent with volume overload.  BNP elevated to 8000.  Troponin elevated to 50.  Overall suspect heart failure exacerbation causing generalized weakness when he walks.  Covid test and flu test negative.  Lactic acid is 2.  Urinalysis negative for infection.  Chest x-ray not consistent with infection.  No major leukocytosis.  Hemoglobin is 7.  Patient had brown stool on exam.  Hemoccult is negative.  Could be a component of symptomatic anemia but hemoglobin is usually between 7 and 8.  Creatinine is at baseline.  Overall believe he benefit from some diuresis.  He is on a [employer] and will continue to work on getting him normothermic.  To be admitted to medicine.  This chart was dictated using voice recognition software.  Despite best efforts to proofread,  errors can occur which can change the documentation meaning.    Final Clinical Impression(s) / ED Diagnoses Final diagnoses:  Altered mental status, unspecified altered mental status type  Hypervolemia,  unspecified hypervolemia type  Hypothermia, initial encounter    Rx / DC Orders ED Discharge Orders    None       Lennice Sites, DO 11/25/20 2239

## 2020-11-25 NOTE — ED Notes (Signed)
Date and time results received: 11/25/20 2014   Test: Lactic acid Critical Value: 2.0  Name of Provider Notified: CUratolo MD

## 2020-11-25 NOTE — Progress Notes (Addendum)
Pharmacy Antibiotic Note  Adonys Kaden is a 75 y.o. male with ESRD not on HD admitted on 11/25/2020 with SOB, possible PNA.  Pharmacy has been consulted for Vancomycin and Cefepime  dosing.  Plan: Vancomycin 1500 mg IV now F/U Vancomycin level in 2-3 days and redose as indicated. Cefepime 2 g IV now, then 1 g IV q24h      Temp (24hrs), Avg:94.4 F (34.7 C), Min:93.1 F (33.9 C), Max:95.5 F (35.3 C)  Recent Labs  Lab 11/25/20 1807 11/25/20 1855 11/25/20 1910 11/25/20 2044  WBC 3.9*  --   --   --   CREATININE 7.35*  --  7.30*  --   LATICACIDVEN  --  2.0*  --  1.8    CrCl cannot be calculated (Unknown ideal weight.).    No Known Allergies   Caryl Pina 11/25/2020 10:56 PM

## 2020-11-25 NOTE — ED Notes (Signed)
Patient transported to CT 

## 2020-11-26 DIAGNOSIS — R4781 Slurred speech: Secondary | ICD-10-CM | POA: Diagnosis present

## 2020-11-26 DIAGNOSIS — I69328 Other speech and language deficits following cerebral infarction: Secondary | ICD-10-CM | POA: Diagnosis not present

## 2020-11-26 DIAGNOSIS — E538 Deficiency of other specified B group vitamins: Secondary | ICD-10-CM | POA: Diagnosis present

## 2020-11-26 DIAGNOSIS — E039 Hypothyroidism, unspecified: Secondary | ICD-10-CM | POA: Diagnosis present

## 2020-11-26 DIAGNOSIS — E1122 Type 2 diabetes mellitus with diabetic chronic kidney disease: Secondary | ICD-10-CM | POA: Diagnosis present

## 2020-11-26 DIAGNOSIS — X31XXXA Exposure to excessive natural cold, initial encounter: Secondary | ICD-10-CM | POA: Diagnosis not present

## 2020-11-26 DIAGNOSIS — D631 Anemia in chronic kidney disease: Secondary | ICD-10-CM | POA: Diagnosis present

## 2020-11-26 DIAGNOSIS — I132 Hypertensive heart and chronic kidney disease with heart failure and with stage 5 chronic kidney disease, or end stage renal disease: Secondary | ICD-10-CM | POA: Diagnosis present

## 2020-11-26 DIAGNOSIS — Z794 Long term (current) use of insulin: Secondary | ICD-10-CM | POA: Diagnosis not present

## 2020-11-26 DIAGNOSIS — Z79890 Hormone replacement therapy (postmenopausal): Secondary | ICD-10-CM | POA: Diagnosis not present

## 2020-11-26 DIAGNOSIS — G9341 Metabolic encephalopathy: Secondary | ICD-10-CM | POA: Diagnosis present

## 2020-11-26 DIAGNOSIS — Z9181 History of falling: Secondary | ICD-10-CM | POA: Diagnosis not present

## 2020-11-26 DIAGNOSIS — Z79899 Other long term (current) drug therapy: Secondary | ICD-10-CM | POA: Diagnosis not present

## 2020-11-26 DIAGNOSIS — N184 Chronic kidney disease, stage 4 (severe): Secondary | ICD-10-CM | POA: Diagnosis not present

## 2020-11-26 DIAGNOSIS — Z820 Family history of epilepsy and other diseases of the nervous system: Secondary | ICD-10-CM | POA: Diagnosis not present

## 2020-11-26 DIAGNOSIS — R4182 Altered mental status, unspecified: Secondary | ICD-10-CM

## 2020-11-26 DIAGNOSIS — D638 Anemia in other chronic diseases classified elsewhere: Secondary | ICD-10-CM | POA: Diagnosis not present

## 2020-11-26 DIAGNOSIS — Z66 Do not resuscitate: Secondary | ICD-10-CM | POA: Diagnosis present

## 2020-11-26 DIAGNOSIS — J189 Pneumonia, unspecified organism: Secondary | ICD-10-CM | POA: Diagnosis present

## 2020-11-26 DIAGNOSIS — E1165 Type 2 diabetes mellitus with hyperglycemia: Secondary | ICD-10-CM | POA: Diagnosis present

## 2020-11-26 DIAGNOSIS — T68XXXA Hypothermia, initial encounter: Secondary | ICD-10-CM | POA: Diagnosis present

## 2020-11-26 DIAGNOSIS — Z83438 Family history of other disorder of lipoprotein metabolism and other lipidemia: Secondary | ICD-10-CM | POA: Diagnosis not present

## 2020-11-26 DIAGNOSIS — N185 Chronic kidney disease, stage 5: Secondary | ICD-10-CM | POA: Diagnosis present

## 2020-11-26 DIAGNOSIS — F0390 Unspecified dementia without behavioral disturbance: Secondary | ICD-10-CM | POA: Diagnosis present

## 2020-11-26 DIAGNOSIS — I5033 Acute on chronic diastolic (congestive) heart failure: Secondary | ICD-10-CM | POA: Diagnosis present

## 2020-11-26 DIAGNOSIS — Z8249 Family history of ischemic heart disease and other diseases of the circulatory system: Secondary | ICD-10-CM | POA: Diagnosis not present

## 2020-11-26 DIAGNOSIS — Z20822 Contact with and (suspected) exposure to covid-19: Secondary | ICD-10-CM | POA: Diagnosis present

## 2020-11-26 DIAGNOSIS — R296 Repeated falls: Secondary | ICD-10-CM | POA: Diagnosis present

## 2020-11-26 LAB — CBC
HCT: 20.8 % — ABNORMAL LOW (ref 39.0–52.0)
HCT: 31.7 % — ABNORMAL LOW (ref 39.0–52.0)
Hemoglobin: 7 g/dL — ABNORMAL LOW (ref 13.0–17.0)
Hemoglobin: 9.8 g/dL — ABNORMAL LOW (ref 13.0–17.0)
MCH: 29.9 pg (ref 26.0–34.0)
MCH: 31.5 pg (ref 26.0–34.0)
MCHC: 30.9 g/dL (ref 30.0–36.0)
MCHC: 33.7 g/dL (ref 30.0–36.0)
MCV: 93.7 fL (ref 80.0–100.0)
MCV: 96.6 fL (ref 80.0–100.0)
Platelets: 128 10*3/uL — ABNORMAL LOW (ref 150–400)
Platelets: 151 10*3/uL (ref 150–400)
RBC: 2.22 MIL/uL — ABNORMAL LOW (ref 4.22–5.81)
RBC: 3.28 MIL/uL — ABNORMAL LOW (ref 4.22–5.81)
RDW: 15.2 % (ref 11.5–15.5)
RDW: 15.2 % (ref 11.5–15.5)
WBC: 2.5 10*3/uL — ABNORMAL LOW (ref 4.0–10.5)
WBC: 5.3 10*3/uL (ref 4.0–10.5)
nRBC: 0 % (ref 0.0–0.2)
nRBC: 0 % (ref 0.0–0.2)

## 2020-11-26 LAB — CBG MONITORING, ED
Glucose-Capillary: 134 mg/dL — ABNORMAL HIGH (ref 70–99)
Glucose-Capillary: 137 mg/dL — ABNORMAL HIGH (ref 70–99)
Glucose-Capillary: 211 mg/dL — ABNORMAL HIGH (ref 70–99)

## 2020-11-26 LAB — BASIC METABOLIC PANEL
Anion gap: 12 (ref 5–15)
BUN: 79 mg/dL — ABNORMAL HIGH (ref 8–23)
CO2: 28 mmol/L (ref 22–32)
Calcium: 7.2 mg/dL — ABNORMAL LOW (ref 8.9–10.3)
Chloride: 105 mmol/L (ref 98–111)
Creatinine, Ser: 7.38 mg/dL — ABNORMAL HIGH (ref 0.61–1.24)
GFR, Estimated: 7 mL/min — ABNORMAL LOW (ref >60)
Glucose, Bld: 230 mg/dL — ABNORMAL HIGH (ref 70–99)
Potassium: 3.5 mmol/L (ref 3.5–5.1)
Sodium: 145 mmol/L (ref 135–145)

## 2020-11-26 LAB — TSH: TSH: 16.307 u[IU]/mL — ABNORMAL HIGH (ref 0.350–4.500)

## 2020-11-26 LAB — CREATININE, SERUM
Creatinine, Ser: 7.49 mg/dL — ABNORMAL HIGH (ref 0.61–1.24)
GFR, Estimated: 7 mL/min — ABNORMAL LOW (ref >60)

## 2020-11-26 LAB — CORTISOL: Cortisol, Plasma: 16.4 ug/dL

## 2020-11-26 LAB — GLUCOSE, CAPILLARY
Glucose-Capillary: 140 mg/dL — ABNORMAL HIGH (ref 70–99)
Glucose-Capillary: 215 mg/dL — ABNORMAL HIGH (ref 70–99)
Glucose-Capillary: 335 mg/dL — ABNORMAL HIGH (ref 70–99)

## 2020-11-26 LAB — T4, FREE: Free T4: 0.88 ng/dL (ref 0.61–1.12)

## 2020-11-26 MED ORDER — SODIUM CHLORIDE 0.9 % IV SOLN
INTRAVENOUS | Status: DC | PRN
  Administered 2020-11-26 – 2020-11-27 (×2): 250 mL via INTRAVENOUS

## 2020-11-26 MED ORDER — LEVOTHYROXINE SODIUM 25 MCG PO TABS: 25.0000 ug | ORAL_TABLET | Freq: Once | ORAL | Status: DC

## 2020-11-26 MED ORDER — LEVOTHYROXINE SODIUM 50 MCG PO TABS
50.0000 ug | ORAL_TABLET | Freq: Every day | ORAL | Status: DC
  Administered 2020-11-27 – 2020-11-28 (×2): 50 ug via ORAL
  Filled 2020-11-26 (×2): qty 1

## 2020-11-26 MED ORDER — CHLORHEXIDINE GLUCONATE CLOTH 2 % EX PADS
6.0000 | MEDICATED_PAD | Freq: Every day | CUTANEOUS | Status: DC
  Administered 2020-11-26 – 2020-11-28 (×3): 6 via TOPICAL

## 2020-11-26 MED ORDER — INSULIN ASPART 100 UNIT/ML ~~LOC~~ SOLN
2.0000 [IU] | Freq: Once | SUBCUTANEOUS | Status: AC
  Administered 2020-11-26: 2 [IU] via SUBCUTANEOUS

## 2020-11-26 NOTE — ED Notes (Signed)
Pt placement notified that pt bed changed from SDU to tele.

## 2020-11-26 NOTE — ED Notes (Signed)
Daughter, Qaadir Quam updated.

## 2020-11-26 NOTE — Evaluation (Signed)
Occupational Therapy Evaluation Patient Details Name: [name] [name] MRN: [id number] DOB: 18-Feb-1946 Today's Date: 11/26/2020    History of Present Illness Pt is a 75 y/o male admitted secondary to AMS and hypothermia. PMH includes dementia, DM, CKD, HTN, and CVA.   Clinical Impression   Per chart review, pt has hospice and unsure of baseline function; pt poor historian and unable to provide information. Pt currently requiring Max A for UB ADLs, Total A for LB ADLs, and Max A for bed mobility. Pt sitting at EOB for ~3 minutes with Min A for sitting balance. Pt would benefit from further acute OT to facilitate safe dc and prevent deconditioning. Recommend dc to home and continue hospice care.     Follow Up Recommendations  Supervision/Assistance - 24 hour (Continue Hospice)    Equipment Recommendations  Other (comment) (Pending information about home DME)    Recommendations for Other Services PT consult     Precautions / Restrictions Precautions Precautions: Fall Restrictions Weight Bearing Restrictions: No      Mobility Bed Mobility Overal bed mobility: Needs Assistance Bed Mobility: Supine to Sit;Sit to Supine     Supine to sit: Max assist Sit to supine: Max assist   General bed mobility comments: Max A to bring BLEs towards EOB and then elevate trunk. Max A for returning to supine    Transfers                 General transfer comment: defer for safety    Balance Overall balance assessment: Needs assistance Sitting-balance support: Bilateral upper extremity supported;Feet supported Sitting balance-Leahy Scale: Poor Sitting balance - Comments: Reliant on Min A for sitting balance                                   ADL either performed or assessed with clinical judgement   ADL Overall ADL's : Needs assistance/impaired Eating/Feeding: Set up;Bed level   Grooming: Minimal assistance;Bed level   Upper Body Bathing: Maximal assistance;Bed level    Lower Body Bathing: Total assistance;Bed level   Upper Body Dressing : Maximal assistance;Bed level   Lower Body Dressing: Total assistance;Bed level                 General ADL Comments: Pt performing bed mobility to sit at EOB. Pt gaining sitting balance nad requiring Min A for sitting balance. Max-Total A for ADLs.     Vision         Perception     Praxis      Pertinent Vitals/Pain Pain Assessment: Faces Faces Pain Scale: Hurts even more Pain Location: bilateral hips Pain Descriptors / Indicators: Aching;Operative site guarding Pain Intervention(s): Monitored during session;Limited activity within patient's tolerance;Repositioned     Hand Dominance Right   Extremity/Trunk Assessment Upper Extremity Assessment Upper Extremity Assessment: Generalized weakness;LUE deficits/detail LUE Deficits / Details: Noting decreased strength and FM skills. increased edema. Able to perform [facility] at shoulder LUE Coordination: decreased fine motor   Lower Extremity Assessment Lower Extremity Assessment: Generalized weakness;Defer to PT evaluation   Cervical / Trunk Assessment Cervical / Trunk Assessment: Kyphotic   Communication Communication Communication: HOH   Cognition Arousal/Alertness: Awake/alert Behavior During Therapy: WFL for tasks assessed/performed Overall Cognitive Status: No family/caregiver present to determine baseline cognitive functioning  General Comments: Dementia at baseline. Slow processing and difficulty sequencing. Pt reporting it is 2022 and April. Very pleasant and will to participate in therapy.   General Comments  VSS throughout. HR 83, SpO2 96 on RA, RR 20, BP 153/73 (96)    Exercises     Shoulder Instructions      Home Living Family/patient expects to be discharged to:: Private residence                                 Additional Comments: Pt reporting he lives with his  brother, mother, and sister. Per chart review, pt has daughter names [name] and is a hospice patient. Unsure of home information as pt unable to report.      Prior Functioning/Environment          Comments: Unsure of baseline given cognitive deficits. Reports he is independent with ambulation, but per chart has had multiple falls.        OT Problem List: Decreased strength;Decreased range of motion;Decreased activity tolerance;Impaired balance (sitting and/or standing);Decreased safety awareness;Decreased knowledge of use of DME or AE;Decreased knowledge of precautions;Decreased cognition      OT Treatment/Interventions: Self-care/ADL training;Therapeutic exercise;Energy conservation;DME and/or AE instruction;Therapeutic activities;Patient/family education    OT Goals(Current goals can be found in the care plan section) Acute Rehab OT Goals Patient Stated Goal: Agreeable to sit at EOB OT Goal Formulation: With patient Time For Goal Achievement: 12/10/20 Potential to Achieve Goals: Good  OT Frequency: Min 2X/week   Barriers to D/C:            Co-evaluation              AM-PAC OT "6 Clicks" Daily Activity     Outcome Measure Help from another person eating meals?: A Little Help from another person taking care of personal grooming?: A Little Help from another person toileting, which includes using toliet, bedpan, or urinal?: A Lot Help from another person bathing (including washing, rinsing, drying)?: A Lot Help from another person to put on and taking off regular upper body clothing?: A Lot Help from another person to put on and taking off regular lower body clothing?: A Lot 6 Click Score: 14   End of Session Nurse Communication: Mobility status  Activity Tolerance: Patient tolerated treatment well;Patient limited by fatigue Patient left: in bed;with call bell/phone within reach;with bed alarm set  OT Visit Diagnosis: Unsteadiness on feet (R26.81);Other abnormalities  of gait and mobility (R26.89);Muscle weakness (generalized) (M62.81)                Time: [id number] OT Time Calculation (min): 28 min Charges:  OT General Charges $OT Visit: 1 Visit OT Evaluation $OT Eval Moderate Complexity: 1 Mod OT Treatments $Self Care/Home Management : 8-22 mins  Senie Lanese MSOT, OTR/L Acute Rehab Pager: [phone] Office: [place] 11/26/2020, 4:46 PM

## 2020-11-26 NOTE — ED Notes (Signed)
PT had soft medium sized bowel movement. PT cleaned, linen changed.

## 2020-11-26 NOTE — Progress Notes (Signed)
[facility]                                                                              Patient Demographics  [name] [name], is a 75 y.o. male, DOB - 11/25/1945, [id number]  Admit date - 11/25/2020   Admitting Physician [name] Patience, MD  Outpatient Primary MD for the patient is [name] Maw, MD  Outpatient specialists:   LOS - 0  days   Medical records reviewed and are as summarized below:    Chief Complaint  Patient presents with  . Altered Mental Status       Brief summary   Patient is a 75 year old male with history of CKD stage IV, chronic anemia, diabetes mellitus, hypertension, prior CVA was noted to have slurred speech, shortness of breath over the last 24 hours PTA.  Family also reported decline in mental status for a week.  Patient also was feeling cold after cold exposure.  Per EDP, patient had a bath on the night of admission and then was left with a open window.  Patient had multiple falls over the past few days. In ED, temp 93.2 F, respiratory rate 23, BP 153/83, O2 sats 97% on room air  BNP elevated to 8000, troponin 50, chest x-ray with volume overload.  Covid test negative.  Hemoglobin 7, lactic acid 2.  UA negative for UTI. Received Lasix 40 mg IV x1, was placed on Bair hugger and admitted for hypothermia and volume overload  Assessment & Plan    Principal Problem:   Hypothermia -Likely due to cold exposure as mentioned above.  Temp 93.2 F on arrival -Bair hugger applied, temperature now normalized 98.7. -Infectious work-up was initiated, follow blood cultures, UA negative for UTI.  No hypoglycemia. + Lactic acid 2.0.  Ammonia level 16 -Also has hypothyroidism with TSH 16.30, Cortisol level normal -Chest x-ray with rt> left pleural effusions, lung bases opacities, no convincing pneumonia, no leukocytosis.  Patient was placed on IV vancomycin and cefepime.  -Repeat chest x-ray in a.m. after diuresis today.  If no systemic  s/s of infection, will de-escalate/dc antibiotics in a.m.   Active Problems: Hypothyroidism -Patient is on Synthroid 25 MCG, TSH 16.3, will increase Synthroid to 50 MCG -Follow free T4, T3  CKD stage IV with fluid overload/acute on chronic diastolic CHF -Not on hemodialysis, chest x-ray consistent with volume overload.  BNP elevated -Received Lasix 40 mg IV x1, resume Lasix 40 mg oral twice daily -Continue sodium bicarb, PhosLo, under hospice care, no plans of HD.  History of prior CVA, 2016, with slurred speech, acute anabolic encephalopathy -Also has dementia, currently under hospice -MRI of the brain showed no acute findings, extensive chronic small vessel ischemic changes, old small parietal cortical and subcortical infarction, 11x 5 mm meningioma without mass-effect upon the brain    Anemia of chronic disease/CKD stage IV -Baseline hemoglobin ~8, presented with Hb of 7.0 -Hemoglobin 9.8 this morning, stable    Uncontrolled type 2 diabetes mellitus with diabetic nephropathy, with long-term current use of insulin ([name]) -CBG 274 at the time of admission, -Follow hemoglobin A1c, continue Lantus 5 units at bedtime, sliding  scale insulin  Mildly elevated troponin -likely due to volume overload, CKD stage IV -Currently no chest pain or shortness of breath  Essential hypertension -BP somewhat elevated, resumed Norvasc, hydralazine, Lasix    Dementia ([name]) -Currently alert and awake, oriented to self, has slurred speech, somewhat confused -Unclear baseline, I called patient's sister but was not able to make contact.  Code Status: DNR, hospice patient DVT Prophylaxis:  heparin injection 5,000 Units Start: 11/26/20 1400   Level of Care: Level of care: Progressive Family Communication: Called patient's sister to update, [name] [name] phone # [phone], unable to make contact, left a detailed voicemail message  Disposition Plan:     Status is: Observation  The patient remains  OBS appropriate and will d/c before 2 midnights.  Dispo: The patient is from: Home              Anticipated d/c is to: Home              Anticipated d/c date is: 2 days              Patient currently is not medically stable to d/c.   Difficult to place patient No   Time Spent in minutes   37mns   Procedures:  MRI brain  Consultants:   None  Antimicrobials:   Anti-infectives (From admission, onward)   Start     Dose/Rate Route Frequency Ordered Stop   11/26/20 2200  ceFEPIme (MAXIPIME) 1 g in sodium chloride 0.9 % 100 mL IVPB        1 g 200 mL/hr over 30 Minutes Intravenous Every 24 hours 11/25/20 2303     11/25/20 2359  vancomycin (VANCOREADY) IVPB 1500 mg/300 mL        1,500 mg 150 mL/hr over 120 Minutes Intravenous  Once 11/25/20 2303 11/26/20 0419   11/25/20 2359  ceFEPIme (MAXIPIME) 2 g in sodium chloride 0.9 % 100 mL IVPB        2 g 200 mL/hr over 30 Minutes Intravenous  Once 11/25/20 2303 11/26/20 0126          Medications  Scheduled Meds: . amLODipine  10 mg Oral Daily  . calcium acetate  1,334 mg Oral TID WC  . docusate sodium  100 mg Oral BID  . feeding supplement  237 mL Oral BID BM  . furosemide  40 mg Oral BID  . heparin  5,000 Units Subcutaneous Q8H  . hydrALAZINE  100 mg Oral Q8H  . insulin aspart  0-9 Units Subcutaneous TID WC  . insulin glargine  5 Units Subcutaneous QHS  . levothyroxine  25 mcg Oral Daily  . sodium bicarbonate  650 mg Oral BID  . cyanocobalamin  1,000 mcg Oral Daily   Continuous Infusions: . ceFEPime (MAXIPIME) IV     PRN Meds:.acetaminophen **OR** acetaminophen, hyoscyamine, LORazepam      Subjective:   RMeridith Treanorwas seen and examined today.  Alert and awake, oriented to himself, has dementia, difficult to obtain review of system from the patient due to his speech deficit, dementia.  Appears to be comfortable.  Body temperature has improved, 98.3 F.  Objective:   Vitals:   11/26/20 0415 11/26/20 0430 11/26/20  0500 11/26/20 0630  BP:   (!) 151/80   Pulse: 84 83 83 80  Resp: (!) 21 (!) 25 (!) 22 19  Temp: 98.8 F (37.1 C) 98.7 F (37.1 C) 98.6 F (37 C) 98.3 F (36.8 C)  TempSrc:  SpO2: 96% 97% 96% 96%    Intake/Output Summary (Last 24 hours) at 11/26/2020 [id number] Last data filed at 11/26/2020 0419 Gross per 24 hour  Intake 397.16 ml  Output -  Net 397.16 ml     Wt Readings from Last 3 Encounters:  10/18/20 73.5 kg  09/11/20 73.5 kg  08/19/20 83.7 kg     Exam  General: Alert and oriented x self, NAD however difficult to comprehend with his speech deficit  Cardiovascular: S1 S2 auscultated, no murmurs, RRR  Respiratory: Decreased breath sound at the bases  Gastrointestinal: Soft, nontender, nondistended, + bowel sounds  Ext: 1+ pedal edema bilaterally  Neuro: moving all 4 extremities spontaneously  Musculoskeletal: No digital cyanosis, clubbing  Skin: No rashes  Psych: alert and awake,    Data Reviewed:  I have personally reviewed following labs and imaging studies  Micro Results Recent Results (from the past 240 hour(s))  Resp Panel by RT-PCR (Flu A&B, Covid) Nasopharyngeal Swab     Status: None   Collection Time: 11/25/20  6:55 PM   Specimen: Nasopharyngeal Swab; Nasopharyngeal(NP) swabs in vial transport medium  Result Value Ref Range Status   SARS Coronavirus 2 by RT PCR NEGATIVE NEGATIVE Final    Comment: (NOTE) SARS-CoV-2 target nucleic acids are NOT DETECTED.  The SARS-CoV-2 RNA is generally detectable in upper respiratory specimens during the acute phase of infection. The lowest concentration of SARS-CoV-2 viral copies this assay can detect is 138 copies/mL. A negative result does not preclude SARS-Cov-2 infection and should not be used as the sole basis for treatment or other patient management decisions. A negative result may occur with  improper specimen collection/handling, submission of specimen other than nasopharyngeal swab, presence of viral  mutation(s) within the areas targeted by this assay, and inadequate number of viral copies(<138 copies/mL). A negative result must be combined with clinical observations, patient history, and epidemiological information. The expected result is Negative.  Fact Sheet for Patients:  EntrepreneurPulse.com.au  Fact Sheet for Healthcare Providers:  IncredibleEmployment.be  This test is no t yet approved or cleared by the [place] FDA and  has been authorized for detection and/or diagnosis of SARS-CoV-2 by FDA under an Emergency Use Authorization (EUA). This EUA will remain  in effect (meaning this test can be used) for the duration of the COVID-19 declaration under Section 564(b)(1) of the Act, 21 U.S.C.section 360bbb-3(b)(1), unless the authorization is terminated  or revoked sooner.       Influenza A by PCR NEGATIVE NEGATIVE Final   Influenza B by PCR NEGATIVE NEGATIVE Final    Comment: (NOTE) The Xpert Xpress SARS-CoV-2/FLU/RSV plus assay is intended as an aid in the diagnosis of influenza from Nasopharyngeal swab specimens and should not be used as a sole basis for treatment. Nasal washings and aspirates are unacceptable for Xpert Xpress SARS-CoV-2/FLU/RSV testing.  Fact Sheet for Patients: EntrepreneurPulse.com.au  Fact Sheet for Healthcare Providers: IncredibleEmployment.be  This test is not yet approved or cleared by the [place] FDA and has been authorized for detection and/or diagnosis of SARS-CoV-2 by FDA under an Emergency Use Authorization (EUA). This EUA will remain in effect (meaning this test can be used) for the duration of the COVID-19 declaration under Section 564(b)(1) of the Act, 21 U.S.C. section 360bbb-3(b)(1), unless the authorization is terminated or revoked.  Performed at [facility], [place] [address], [place], [place] [address]     Radiology Reports CT HEAD WO  CONTRAST  Result Date: 11/25/2020 CLINICAL DATA:  Slurred speech for 3 days. Decline in mental status for 1 week. Multiple falls. EXAM: CT HEAD WITHOUT CONTRAST TECHNIQUE: Contiguous axial images were obtained from the base of the skull through the vertex without intravenous contrast. COMPARISON:  10/18/2020 FINDINGS: Brain: Small single remote right and left lacunar infarcts in the corona radiata bilaterally. Periventricular white matter and corona radiata hypodensities favor chronic ischemic microvascular white matter disease. Otherwise, the brainstem, cerebellum, cerebral peduncles, thalamus, basal ganglia, basilar cisterns, and ventricular system appear within normal limits. No intracranial hemorrhage, mass lesion, or acute CVA. Vascular: There is atherosclerotic calcification of the cavernous carotid arteries bilaterally. Skull: Unremarkable Sinuses/Orbits: Chronic left maxillary sinusitis. Left mastoid effusion. Mild chronic right sphenoid sinusitis. Other: No supplemental non-categorized findings. IMPRESSION: 1. No acute intracranial findings. 2. Periventricular white matter and corona radiata hypodensities favor chronic ischemic microvascular white matter disease. 3. Small single chronic/remote lacunar infarcts in the corona radiata bilaterally. 4. Chronic left maxillary sinusitis. Left mastoid effusion. Mild chronic right sphenoid sinusitis. Electronically Signed   By: Van Clines M.D.   On: 11/25/2020 18:55   MR BRAIN WO CONTRAST  Result Date: 11/26/2020 CLINICAL DATA:  Neurological deficit. Acute stroke suspected. Slurred speech. EXAM: MRI HEAD WITHOUT CONTRAST TECHNIQUE: Multiplanar, multiecho pulse sequences of the brain and surrounding structures were obtained without intravenous contrast. COMPARISON:  Head CT yesterday. FINDINGS: Brain: Diffusion imaging does not show any acute or subacute infarction. Chronic small-vessel ischemic changes affect pons. No focal cerebellar insult. Cerebral  hemispheres show an old small vessel infarction in the right thalamus and extensive chronic small-vessel ischemic changes throughout the hemispheric white matter. There is an old right parietal cortical and subcortical infarction and an old cortical and subcortical infarction at the right parietal vertex. No intra-axial mass lesion, hemorrhage, hydrocephalus or extra-axial fluid collection. There is a small meningioma at the anterior aspect of the anterior cranial fossa on the right measuring 11 mm in diameter with a thickness 4-5 mm. No mass-effect upon the brain. Vascular: Major vessels at the base of the brain show flow. Skull and upper cervical spine: Negative Sinuses/Orbits: Paranasal sinuses are clear. Orbits are negative. There are bilateral mastoid effusions, more extensive on the left than the right. Other: None IMPRESSION: 1. No acute finding by MRI. Extensive chronic small-vessel ischemic changes throughout the brain. 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 2. 11 x 5 mm meningioma at the anterior aspect of the anterior cranial fossa on the right without mass-effect upon the brain. Electronically Signed   By: Nelson Chimes M.D.   On: 11/26/2020 00:09   DG Chest Portable 1 View  Result Date: 11/25/2020 CLINICAL DATA:  Altered mental status.  Slurred speech for 3 days. EXAM: PORTABLE CHEST 1 VIEW COMPARISON:  10/18/2020 FINDINGS: Airspace opacities are noted the right extending from the perihilar region to the lung base obscuring hemidiaphragm. There is additional opacity at the left lung base, mostly retrocardiac. Remainder of the lungs is clear. Right greater than left pleural effusions. No evidence of a pneumothorax. Cardiac silhouette is normal in size. No mediastinal or hilar masses. Skeletal structures are grossly intact. IMPRESSION: 1. Right greater than left pleural effusions associated with lung base opacity, consistent with atelectasis, as noted on the prior chest CT. No  convincing pneumonia and no evidence of pulmonary edema. Overall without significant change from the prior chest radiograph/chest CT. Electronically Signed   By: Lajean Manes M.D.   On: 11/25/2020 19:35    Lab Data:  CBC: Recent Labs  Lab 11/25/20 1807 11/25/20 1910 11/26/20 0026  WBC 3.9*  --  2.5*  NEUTROABS 2.4  --   --   HGB 7.0* 8.2*  7.8* 9.8*  HCT 23.0* 24.0*  23.0* 31.7*  MCV 97.0  --  96.6  PLT 150  --  0000000*   Basic Metabolic Panel: Recent Labs  Lab 11/25/20 1807 11/25/20 1910 11/26/20 0026  NA 145 145  145  --   K 3.2* 3.3*  3.3*  --   CL 104 103  --   CO2 27  --   --   GLUCOSE 274* 251*  --   BUN 80* 81*  --   CREATININE 7.35* 7.30* 7.49*  CALCIUM 7.1*  --   --    GFR: CrCl cannot be calculated (Unknown ideal weight.). Liver Function Tests: Recent Labs  Lab 11/25/20 1807  AST 28  ALT 14  ALKPHOS 61  BILITOT 0.5  PROT 5.8*  ALBUMIN 2.5*   No results for input(s): LIPASE, AMYLASE in the last 168 hours. Recent Labs  Lab 11/25/20 1855  AMMONIA 16   Coagulation Profile: Recent Labs  Lab 11/25/20 1855  INR 1.3*   Cardiac Enzymes: No results for input(s): CKTOTAL, CKMB, CKMBINDEX, TROPONINI in the last 168 hours. BNP (last 3 results) No results for input(s): PROBNP in the last 8760 hours. HbA1C: No results for input(s): HGBA1C in the last 72 hours. CBG: Recent Labs  Lab 11/26/20 0031  GLUCAP 211*   Lipid Profile: No results for input(s): CHOL, HDL, LDLCALC, TRIG, CHOLHDL, LDLDIRECT in the last 72 hours. Thyroid Function Tests: Recent Labs    11/26/20 0021  TSH 16.307*   Anemia Panel: No results for input(s): VITAMINB12, FOLATE, FERRITIN, TIBC, IRON, RETICCTPCT in the last 72 hours. Urine analysis:    Component Value Date/Time   COLORURINE YELLOW 11/25/2020 2109   APPEARANCEUR CLEAR 11/25/2020 2109   LABSPEC 1.020 11/25/2020 2109   PHURINE 8.0 11/25/2020 2109   GLUCOSEU >=500 (A) 11/25/2020 2109   GLUCOSEU >=1000 (A)  12/08/2019 1139   HGBUR NEGATIVE 11/25/2020 2109   BILIRUBINUR NEGATIVE 11/25/2020 2109   BILIRUBINUR Negative 11/09/2019 0910   KETONESUR NEGATIVE 11/25/2020 2109   PROTEINUR >300 (A) 11/25/2020 2109   UROBILINOGEN 0.2 12/08/2019 1139   NITRITE NEGATIVE 11/25/2020 2109   LEUKOCYTESUR NEGATIVE 11/25/2020 2109     Ripudeep Rai M.D. [facility] 11/26/2020, 7:52 AM   Call night coverage person covering after 7pm

## 2020-11-26 NOTE — Progress Notes (Addendum)
[place] 5M[facility]) Hospitalized Patient note.  [name] [name] is a current hospice patient with a terminal diagnosis of ESRD. Family activated EMS after patient complained of being cold, exhibited slurred speech and altered mental status. Family contacted [facility] hospice prior to patient going to the ED but decided to call EMS. Patient was admitted to [facility] on 11/25/2020 for a diagnosis of hypothermia. Per Dr. Orpah Melter with [facility], this is a related hospital admission.   Visited patient at bedside shortly after he was moved from the ED to his room. He was awake and alert but confused to location and situation, sitting up in bed, eating lunch. His speech is difficult to understand. Lab was at bedside attempting to draw labs for blood cultures but having difficulty accessing a vein. No family at bedside. Spoke with sister by phone to update.   VS: 98.3, 81, 144/95, 16, 96% RA Abnormal labs:  11/26/2020 00:26 Creatinine: 7.49 (H) GFR, Estimated: 7 (L) WBC: 2.5 (L) RBC: 3.28 (L) Hemoglobin: 9.8 (L) HCT: 31.7 (L) Platelets: 128 (L) TSH: 16.307 (H) 11/25/2020 19:10 pH, Ven: 7.451 (H) pO2, Ven: 133.0 (H) TCO2: 33 (H) Acid-Base Excess: 7.0 (H) Bicarbonate: 31.2 (H) 11/25/2020 18:07 B Natriuretic Peptide: 1,100.2 (H)  IV/PRN Medications:  Maxipime 1gm IVPB QD.   Imaging MR Brain  IMPRESSION: 1. No acute finding by MRI. Extensive chronic small-vessel ischemic changes throughout the brain. 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 2. 11 x 5 mm meningioma at the anterior aspect of the anterior cranial fossa on the right without mass-effect upon the brain.  CXR IMPRESSION: 1. Right greater than left pleural effusions associated with lung base opacity, consistent with atelectasis, as noted on the prior chest CT. No convincing pneumonia and no evidence of pulmonary edema. Overall without significant change from the prior chest radiograph/chest CT.  CT  Head IMPRESSION: 1. No acute intracranial findings. 2. Periventricular white matter and corona radiata hypodensities favor chronic ischemic microvascular white matter disease. 3. Small single chronic/remote lacunar infarcts in the corona radiata bilaterally. 4. Chronic left maxillary sinusitis. Left mastoid effusion. Mild chronic right sphenoid sinusitis.  Problem list: Principal Problem:   Hypothermia Active Problems: Hypothyroidism CKD stage IV with fluid overload/acute on chronic diastolic CHF Anemia of chronic disease/CKD stage IV Uncontrolled type 2 diabetes mellitus with diabetic nephropathy, with long-term current use of insulin Mildly elevated troponin Dementia  Discharge planning: Anticipate discharge 2/15, returning home with hospice. Family contact: Spoke with sister, [name] by phone.  IDG: updated Goals of care: Clear, DNR.   Please use [facility] if ambulance transport is needed at discharge.   If you have questions or need assistance, please call [phone] or contact the hospital Liaison listed on AMION.        Farrel Gordon, RN, [facility] Liaison   [phone]

## 2020-11-26 NOTE — Evaluation (Signed)
Physical Therapy Evaluation Patient Details Name: [name] [name] MRN: [id number] DOB: [birth year]  Today's Date: 11/26/2020   History of Present Illness  Pt is a 75 y/o male admitted secondary to AMS and hypothermia. PMH includes dementia, DM, CKD, HTN, and CVA.  Clinical Impression  Pt admitted secondary to problem above with deficits below. Pt requiring max A for bed mobility this session. Upon sitting, pt yelling out "I want to lay back down" because of reported hip pain. Unsure of patient's baseline, however, question how much he was ambulating given deficits. Pt currently active with hospice services per notes. Anticipate pt will resume hospice care at d/c. Will continue to follow acutely on a trial basis to increase safety and independence with mobility.     Follow Up Recommendations Other (comment);Supervision/Assistance - 24 hour (resume hospice services)    Equipment Recommendations  Wheelchair cushion (measurements PT);Wheelchair (measurements PT);Hospital bed (hoyer lift with pad)    Recommendations for Other Services       Precautions / Restrictions Precautions Precautions: Fall Restrictions Weight Bearing Restrictions: No      Mobility  Bed Mobility Overal bed mobility: Needs Assistance Bed Mobility: Supine to Sit;Sit to Supine     Supine to sit: Max assist Sit to supine: Max assist   General bed mobility comments: Max A for trunk and LE assist to come to sitting. Upon sitting, pt yelling out, "I want to lay back down" and abruptly returning to supine.    Transfers                    Ambulation/Gait                Stairs            Wheelchair Mobility    Modified Rankin (Stroke Patients Only)       Balance Overall balance assessment: Needs assistance Sitting-balance support: Bilateral upper extremity supported;Feet supported Sitting balance-Leahy Scale: Poor Sitting balance - Comments: Reliant on BUE support and at least mod A to  maintain                                     Pertinent Vitals/Pain Pain Assessment: Faces Faces Pain Scale: Hurts even more Pain Location: bilateral hips Pain Descriptors / Indicators: Aching;Operative site guarding Pain Intervention(s): Limited activity within patient's tolerance;Monitored during session;Repositioned    Home Living Family/patient expects to be discharged to:: Private residence                 Additional Comments: Pt reporting he lives with his brother, mother, and sister. Per chart review, pt has daughter names [name] and is a hospice patient. Unsure of home information as pt unable to report.    Prior Function           Comments: Unsure of baseline given cognitive deficits. Reports he is independent with ambulation, but per chart has had multiple falls.     Hand Dominance   Dominant Hand: Right    Extremity/Trunk Assessment   Upper Extremity Assessment Upper Extremity Assessment: Defer to OT evaluation    Lower Extremity Assessment Lower Extremity Assessment: Generalized weakness (reports bilateral hip pain)    Cervical / Trunk Assessment Cervical / Trunk Assessment: Kyphotic  Communication   Communication: HOH  Cognition Arousal/Alertness: Awake/alert Behavior During Therapy: WFL for tasks assessed/performed Overall Cognitive Status: No family/caregiver present to determine baseline cognitive functioning  General Comments: Dementia at baseline. Slow processing and difficulty sequencing.      General Comments      Exercises     Assessment/Plan    PT Assessment Patient needs continued PT services  PT Problem List Decreased strength;Decreased balance;Decreased mobility;Decreased activity tolerance;Decreased cognition;Decreased knowledge of use of DME;Decreased safety awareness;Decreased knowledge of precautions;Pain       PT Treatment Interventions DME instruction;Gait  training;Functional mobility training;Therapeutic activities;Therapeutic exercise;Balance training;Patient/family education;Cognitive remediation    PT Goals (Current goals can be found in the Care Plan section)  Acute Rehab PT Goals PT Goal Formulation: Patient unable to participate in goal setting Time For Goal Achievement: 12/10/20 Potential to Achieve Goals: Fair    Frequency Min 2X/week   Barriers to discharge Other (comment) unsure of caregiver support    Co-evaluation               AM-PAC PT "6 Clicks" Mobility  Outcome Measure Help needed turning from your back to your side while in a flat bed without using bedrails?: A Lot Help needed moving from lying on your back to sitting on the side of a flat bed without using bedrails?: A Lot Help needed moving to and from a bed to a chair (including a wheelchair)?: A Lot Help needed standing up from a chair using your arms (e.g., wheelchair or bedside chair)?: Total Help needed to walk in hospital room?: Total Help needed climbing 3-5 steps with a railing? : Total 6 Click Score: 9    End of Session   Activity Tolerance: Patient limited by pain Patient left: in bed;with call bell/phone within reach;with bed alarm set Nurse Communication: Mobility status PT Visit Diagnosis: Unsteadiness on feet (R26.81);Muscle weakness (generalized) (M62.81)    Time: [id number] PT Time Calculation (min) (ACUTE ONLY): 10 min   Charges:   PT Evaluation $PT Eval Moderate Complexity: 1 Mod          Reuel Derby, PT, DPT  Acute Rehabilitation Services  Pager: [phone] Office: [phone]   Rudean Hitt 11/26/2020, 4:09 PM

## 2020-11-27 ENCOUNTER — Inpatient Hospital Stay (HOSPITAL_COMMUNITY)

## 2020-11-27 DIAGNOSIS — G9341 Metabolic encephalopathy: Principal | ICD-10-CM

## 2020-11-27 DIAGNOSIS — D638 Anemia in other chronic diseases classified elsewhere: Secondary | ICD-10-CM | POA: Diagnosis not present

## 2020-11-27 DIAGNOSIS — R4182 Altered mental status, unspecified: Secondary | ICD-10-CM | POA: Diagnosis not present

## 2020-11-27 DIAGNOSIS — T68XXXA Hypothermia, initial encounter: Secondary | ICD-10-CM | POA: Diagnosis not present

## 2020-11-27 LAB — HEMOGLOBIN AND HEMATOCRIT, BLOOD
HCT: 28.5 % — ABNORMAL LOW (ref 39.0–52.0)
Hemoglobin: 9.2 g/dL — ABNORMAL LOW (ref 13.0–17.0)

## 2020-11-27 LAB — GLUCOSE, CAPILLARY
Glucose-Capillary: 256 mg/dL — ABNORMAL HIGH (ref 70–99)
Glucose-Capillary: 191 mg/dL — ABNORMAL HIGH (ref 70–99)
Glucose-Capillary: 223 mg/dL — ABNORMAL HIGH (ref 70–99)
Glucose-Capillary: 151 mg/dL — ABNORMAL HIGH (ref 70–99)

## 2020-11-27 LAB — CBC
HCT: 22.1 % — ABNORMAL LOW (ref 39.0–52.0)
Hemoglobin: 7.2 g/dL — ABNORMAL LOW (ref 13.0–17.0)
MCH: 30.6 pg (ref 26.0–34.0)
MCHC: 32.6 g/dL (ref 30.0–36.0)
MCV: 94 fL (ref 80.0–100.0)
Platelets: 158 10*3/uL (ref 150–400)
RBC: 2.35 MIL/uL — ABNORMAL LOW (ref 4.22–5.81)
RDW: 15.2 % (ref 11.5–15.5)
WBC: 5.7 10*3/uL (ref 4.0–10.5)
nRBC: 0 % (ref 0.0–0.2)

## 2020-11-27 LAB — RETICULOCYTES
Immature Retic Fract: 8.8 % (ref 2.3–15.9)
RBC.: 2.51 MIL/uL — ABNORMAL LOW (ref 4.22–5.81)
Retic Count, Absolute: 25.9 10*3/uL (ref 19.0–186.0)
Retic Ct Pct: 1 % (ref 0.4–3.1)

## 2020-11-27 LAB — IRON AND TIBC
Iron: 36 ug/dL — ABNORMAL LOW (ref 45–182)
Saturation Ratios: 12 % — ABNORMAL LOW (ref 17.9–39.5)
TIBC: 309 ug/dL (ref 250–450)
UIBC: 273 ug/dL

## 2020-11-27 LAB — PREPARE RBC (CROSSMATCH)

## 2020-11-27 LAB — VITAMIN B12: Vitamin B-12: 606 pg/mL (ref 180–914)

## 2020-11-27 LAB — BASIC METABOLIC PANEL
Anion gap: 12 (ref 5–15)
BUN: 81 mg/dL — ABNORMAL HIGH (ref 8–23)
CO2: 27 mmol/L (ref 22–32)
Calcium: 7.3 mg/dL — ABNORMAL LOW (ref 8.9–10.3)
Chloride: 102 mmol/L (ref 98–111)
Creatinine, Ser: 7.32 mg/dL — ABNORMAL HIGH (ref 0.61–1.24)
GFR, Estimated: 7 mL/min — ABNORMAL LOW (ref >60)
Glucose, Bld: 376 mg/dL — ABNORMAL HIGH (ref 70–99)
Potassium: 3.3 mmol/L — ABNORMAL LOW (ref 3.5–5.1)
Sodium: 141 mmol/L (ref 135–145)

## 2020-11-27 LAB — FOLATE: Folate: 23.8 ng/mL (ref >5.9)

## 2020-11-27 LAB — T3: T3, Total: 47 ng/dL — ABNORMAL LOW (ref 71–180)

## 2020-11-27 LAB — FERRITIN: Ferritin: 162 ng/mL (ref 24–336)

## 2020-11-27 MED ORDER — SODIUM CHLORIDE 0.9% IV SOLUTION: Freq: Once | INTRAVENOUS | Status: AC

## 2020-11-27 MED ORDER — INSULIN GLARGINE 100 UNIT/ML ~~LOC~~ SOLN
15.0000 [IU] | Freq: Every day | SUBCUTANEOUS | Status: DC
  Administered 2020-11-27: 15 [IU] via SUBCUTANEOUS
  Filled 2020-11-27 (×3): qty 0.15

## 2020-11-27 MED ORDER — POTASSIUM CHLORIDE CRYS ER 20 MEQ PO TBCR
40.0000 meq | EXTENDED_RELEASE_TABLET | Freq: Once | ORAL | Status: AC
  Administered 2020-11-27: 40 meq via ORAL
  Filled 2020-11-27: qty 2

## 2020-11-27 MED ORDER — INSULIN GLARGINE 100 UNIT/ML ~~LOC~~ SOLN
10.0000 [IU] | Freq: Every day | SUBCUTANEOUS | Status: DC
  Filled 2020-11-27: qty 0.1

## 2020-11-27 NOTE — Progress Notes (Signed)
[place] 5M03 [facility]) Hospitalized Patient note.  [name] [name] is a current hospice patient with a terminal diagnosis of ESRD. Family activated EMS after patient complained of being cold, exhibited slurred speech and altered mental status. Family contacted [facility] hospice prior to patient going to the ED but decided to call EMS. Patient was admitted to [facility] on 11/25/2020 for a diagnosis of hypothermia. Per Dr. Orpah Melter with [facility], this is a related hospital admission.   Visited patient at bedside. He is recieving a unit of PRBC today due to his drop in Hgb.  He was awake and alert and able to answer questions appropriate, but drifted back off to sleep shortly after. He stated he was OK and in no pain. He is able to feed himself and had eaten all of his lunch. Spoke with sister [name] [name] who states she would like to continue treatment for patients conditions until there is no further treatment available. She also stated that as the time gets closer to end (for [place]) she would like him moved to [facility], due to she doesn't want another death in the home. [facility] RN will follow up at home once discharged.   VS: 98.3, 81, 167/91, 16, 96% RA Abnormal labs:  Potassium: 3.3 (L) Glucose: 376 (H) BUN: 81 (H) Creatinine: 7.32 (H) Calcium: 7.3 (L) GFR, Estimated: 7 (L) WBC: 5.7 RBC: 2.35 (L) Hemoglobin: 7.2 (L) HCT: 22.1 (L)   IV/PRN Medications:  Maxipime 1gm IVPB QD.  1 unit PRBC 11/27/20  Imaging MR Brain  IMPRESSION: 1. No acute finding by MRI. Extensive chronic small-vessel ischemic changes throughout the brain. 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 2. 11 x 5 mm meningioma at the anterior aspect of the anterior cranial fossa on the right without mass-effect upon the brain.  CXR IMPRESSION: 1. Right greater than left pleural effusions associated with lung base opacity, consistent with atelectasis, as noted on the prior chest CT. No convincing  pneumonia and no evidence of pulmonary edema. Overall without significant change from the prior chest radiograph/chest CT.  CT Head IMPRESSION: 1. No acute intracranial findings. 2. Periventricular white matter and corona radiata hypodensities favor chronic ischemic microvascular white matter disease. 3. Small single chronic/remote lacunar infarcts in the corona radiata bilaterally. 4. Chronic left maxillary sinusitis. Left mastoid effusion. Mild chronic right sphenoid sinusitis.  Problem list: Principal Problem:   Hypothermia Active Problems: Hypothyroidism CKD stage IV with fluid overload/acute on chronic diastolic CHF Anemia of chronic disease/CKD stage IV Uncontrolled type 2 diabetes mellitus with diabetic nephropathy, with long-term current use of insulin Mildly elevated troponin Dementia  Discharge planning: Anticipate discharge 2/16, returning home with hospice. Family contact: Spoke with sister, [name] [name] by phone.  IDG: updated Goals of care: Clear, DNR. Sister would like to continue any treatment if possible before moving to comfort care.   Please use [facility] if ambulance transport is needed at discharge.   If you have questions or need assistance, please call [phone] or contact the hospital Liaison listed on AMION.     Clementeen Hoof, BSN, [employer] [phone]

## 2020-11-27 NOTE — Progress Notes (Addendum)
[facility]                                                                              Patient Demographics  [name] [name], is a 75 y.o. male, DOB - 06/21/1946, [id number]  Admit date - 11/25/2020   Admitting Physician [name] Patience, MD  Outpatient Primary MD for the patient is [name] Maw, MD  Outpatient specialists:   LOS - 1  days   Medical records reviewed and are as summarized below:    Chief Complaint  Patient presents with  . Altered Mental Status       Brief summary   Patient is a 75 year old male with history of CKD stage IV, chronic anemia, diabetes mellitus, hypertension, prior CVA was noted to have slurred speech, shortness of breath over the last 24 hours PTA.  Family also reported decline in mental status for a week.  Patient also was feeling cold after cold exposure.  Per EDP, patient had a bath on the night of admission and then was left with a open window.  Patient had multiple falls over the past few days. In ED, temp 93.2 F, respiratory rate 23, BP 153/83, O2 sats 97% on room air  BNP elevated to 8000, troponin 50, chest x-ray with volume overload.  Covid test negative.  Hemoglobin 7, lactic acid 2.  UA negative for UTI. Received Lasix 40 mg IV x1, was placed on Bair hugger and admitted for hypothermia and volume overload  Assessment & Plan    Principal Problem:   Hypothermia -Likely due to cold exposure as mentioned above.  Temp 93.2 F on arrival, Bair hugger applied -Infectious work-up was initiated, blood cultures negative so far.  UA negative for UTI.  No hypoglycemia. + Lactic acid 2.0.  Ammonia level 16 -Also has hypothyroidism with TSH 16.30, Cortisol level normal -Chest x-ray with rt> left pleural effusions, lung bases opacities, no convincing pneumonia, no leukocytosis.  Patient was placed on IV vancomycin and cefepime.  -Repeat chest x-ray today, de-escalate antibiotics if no convincing pneumonia.   Diuresed.  Active Problems: Hypothyroidism -Patient is on Synthroid 25 MCG, TSH 16.3, increased to Synthroid 50 MCG  -T3 level 47, T4 0.8  CKD stage IV with fluid overload/acute on chronic diastolic CHF -Not on hemodialysis, chest x-ray consistent with volume overload.  BNP elevated -Received Lasix 40 mg IV x1, resume Lasix 40 mg oral twice daily -Continue sodium bicarb, PhosLo, under hospice care, no plans of HD.  History of prior CVA, 2016, with slurred speech, acute anabolic encephalopathy -Also has dementia, currently under hospice -MRI of the brain showed no acute findings, extensive chronic small vessel ischemic changes, old small parietal cortical and subcortical infarction, 11x 5 mm meningioma without mass-effect upon the brain    Anemia of chronic disease/CKD stage IV -Baseline hemoglobin ~8,  -FOBT negative, hemoglobin down to 7.2.  Discussed with nephrology, under hospice care, hold off on ESA - will transfuse 1 unit packed RBCs    Uncontrolled type 2 diabetes mellitus with diabetic nephropathy, with long-term current use of insulin ([name]) -Uncontrolled with hyperglycemia.  Hemoglobin A1c 10.0 -Increased Lantus to 15  units daily at bedtime, continue sliding scale insulin  Mildly elevated troponin -likely due to volume overload, CKD stage IV -Currently no chest pain or shortness of breath  Essential hypertension -BP somewhat elevated, resumed Norvasc, hydralazine, Lasix    Dementia ([place]) -Much more alert and oriented today, appears to be at his baseline  Code Status: DNR, hospice patient DVT Prophylaxis:  heparin injection 5,000 Units Start: 11/26/20 1400   Level of Care: Level of care: Telemetry Medical Family Communication: Called patient's sister to update, [name] [name] phone # [phone], call was disconnected after few minutes.  I attempted to call several times but went to voicemail.  Disposition Plan:     Status [id number]   The patient will require care  spanning > 2 midnights and should be moved to inpatient because: Inpatient level of care appropriate due to severity of illness  Dispo: The patient is from: Home              Anticipated d/c is to: Home              Anticipated d/c date is: 1 day              Patient currently is not medically stable to d/c.  receiving blood transfusion today, hopefully DC home in a.m with hospice.  Discussed with patient's hospice nurse as well.   Difficult to place patient No   Time Spent in minutes   17mns   Procedures:  MRI brain  Consultants:   None  Antimicrobials:   Anti-infectives (From admission, onward)   Start     Dose/Rate Route Frequency Ordered Stop   11/26/20 2200  ceFEPIme (MAXIPIME) 1 g in sodium chloride 0.9 % 100 mL IVPB        1 g 200 mL/hr over 30 Minutes Intravenous Every 24 hours 11/25/20 2303     11/25/20 2359  vancomycin (VANCOREADY) IVPB 1500 mg/300 mL        1,500 mg 150 mL/hr over 120 Minutes Intravenous  Once 11/25/20 2303 11/26/20 0419   11/25/20 2359  ceFEPIme (MAXIPIME) 2 g in sodium chloride 0.9 % 100 mL IVPB        2 g 200 mL/hr over 30 Minutes Intravenous  Once 11/25/20 2303 11/26/20 0126         Medications  Scheduled Meds: . amLODipine  10 mg Oral Daily  . calcium acetate  1,334 mg Oral TID WC  . Chlorhexidine Gluconate Cloth  6 each Topical Daily  . docusate sodium  100 mg Oral BID  . feeding supplement  237 mL Oral BID BM  . furosemide  40 mg Oral BID  . heparin  5,000 Units Subcutaneous Q8H  . hydrALAZINE  100 mg Oral Q8H  . insulin aspart  0-9 Units Subcutaneous TID WC  . insulin glargine  10 Units Subcutaneous QHS  . levothyroxine  25 mcg Oral Once  . levothyroxine  50 mcg Oral QAC breakfast  . sodium bicarbonate  650 mg Oral BID  . cyanocobalamin  1,000 mcg Oral Daily   Continuous Infusions: . sodium chloride Stopped (11/26/20 2358)  . ceFEPime (MAXIPIME) IV Stopped (11/26/20 2358)   PRN Meds:.sodium chloride, acetaminophen **OR**  acetaminophen, hyoscyamine, LORazepam      Subjective:   RSage Monwas seen and examined today.  Alert and oriented today appears a lot better from yesterday.  Temp 97.8 F.  Neuro chest pain, shortness of breath, abdominal pain or nausea or vomiting.  No acute  events overnight  Objective:   Vitals:   11/27/20 0446 11/27/20 0954 11/27/20 1109 11/27/20 1145  BP: (!) 167/91 (!) 131/94 (!) 145/79 (!) 146/78  Pulse: 84 85 88 86  Resp: '16 18 18 18  '$ Temp: 98.3 F (36.8 C) 98.2 F (36.8 C) 97.7 F (36.5 C) 97.8 F (36.6 C)  TempSrc:   Oral Oral  SpO2: 96% 95% 96% 100%    Intake/Output Summary (Last 24 hours) at 11/27/2020 1419 Last data filed at 11/27/2020 1243 Gross per 24 hour  Intake 1484.5 ml  Output 925 ml  Net 559.5 ml     Wt Readings from Last 3 Encounters:  10/18/20 73.5 kg  09/11/20 73.5 kg  08/19/20 83.7 kg   Physical Exam  General: Alert and oriented x 3, NAD  Cardiovascular: S1 S2 clear, RRR.  1+ pedal edema b/l  Respiratory: CTAB, no wheezing, rales or rhonchi  Gastrointestinal: Soft, nontender, nondistended, NBS  Ext: 1+ pedal edema bilaterally  Neuro: no new deficits  Musculoskeletal: No cyanosis, clubbing  Skin: No rashes  Psych: Normal affect and demeanor, alert and oriented x3      Data Reviewed:  I have personally reviewed following labs and imaging studies  Micro Results Recent Results (from the past 240 hour(s))  Resp Panel by RT-PCR (Flu A&B, Covid) Nasopharyngeal Swab     Status: None   Collection Time: 11/25/20  6:55 PM   Specimen: Nasopharyngeal Swab; Nasopharyngeal(NP) swabs in vial transport medium  Result Value Ref Range Status   SARS Coronavirus 2 by RT PCR NEGATIVE NEGATIVE Final    Comment: (NOTE) SARS-CoV-2 target nucleic acids are NOT DETECTED.  The SARS-CoV-2 RNA is generally detectable in upper respiratory specimens during the acute phase of infection. The lowest concentration of SARS-CoV-2 viral copies this  assay can detect is 138 copies/mL. A negative result does not preclude SARS-Cov-2 infection and should not be used as the sole basis for treatment or other patient management decisions. A negative result may occur with  improper specimen collection/handling, submission of specimen other than nasopharyngeal swab, presence of viral mutation(s) within the areas targeted by this assay, and inadequate number of viral copies(<138 copies/mL). A negative result must be combined with clinical observations, patient history, and epidemiological information. The expected result is Negative.  Fact Sheet for Patients:  EntrepreneurPulse.com.au  Fact Sheet for Healthcare Providers:  IncredibleEmployment.be  This test is no t yet approved or cleared by the [place] FDA and  has been authorized for detection and/or diagnosis of SARS-CoV-2 by FDA under an Emergency Use Authorization (EUA). This EUA will remain  in effect (meaning this test can be used) for the duration of the COVID-19 declaration under Section 564(b)(1) of the Act, 21 U.S.C.section 360bbb-3(b)(1), unless the authorization is terminated  or revoked sooner.       Influenza A by PCR NEGATIVE NEGATIVE Final   Influenza B by PCR NEGATIVE NEGATIVE Final    Comment: (NOTE) The Xpert Xpress SARS-CoV-2/FLU/RSV plus assay is intended as an aid in the diagnosis of influenza from Nasopharyngeal swab specimens and should not be used as a sole basis for treatment. Nasal washings and aspirates are unacceptable for Xpert Xpress SARS-CoV-2/FLU/RSV testing.  Fact Sheet for Patients: EntrepreneurPulse.com.au  Fact Sheet for Healthcare Providers: IncredibleEmployment.be  This test is not yet approved or cleared by the [place] FDA and has been authorized for detection and/or diagnosis of SARS-CoV-2 by FDA under an Emergency Use Authorization (EUA). This EUA will  remain in effect (  meaning this test can be used) for the duration of the COVID-19 declaration under Section 564(b)(1) of the Act, 21 U.S.C. section 360bbb-3(b)(1), unless the authorization is terminated or revoked.  Performed at [facility], [place] [address], [place], [place] [address]   Culture, blood (single)     Status: None (Preliminary result)   Collection Time: 11/25/20  7:24 PM   Specimen: BLOOD  Result Value Ref Range Status   Specimen Description BLOOD RIGHT HAND  Final   Special Requests   Final    BOTTLES DRAWN AEROBIC AND ANAEROBIC Blood Culture adequate volume   Culture   Final    NO GROWTH < 24 HOURS Performed at [facility], [place] [address] [place], [place] [address]    Report Status PENDING  Incomplete    Radiology Reports CT HEAD WO CONTRAST  Result Date: 11/25/2020 CLINICAL DATA:  Slurred speech for 3 days. Decline in mental status for 1 week. Multiple falls. EXAM: CT HEAD WITHOUT CONTRAST TECHNIQUE: Contiguous axial images were obtained from the base of the skull through the vertex without intravenous contrast. COMPARISON:  10/18/2020 FINDINGS: Brain: Small single remote right and left lacunar infarcts in the corona radiata bilaterally. Periventricular white matter and corona radiata hypodensities favor chronic ischemic microvascular white matter disease. Otherwise, the brainstem, cerebellum, cerebral peduncles, thalamus, basal ganglia, basilar cisterns, and ventricular system appear within normal limits. No intracranial hemorrhage, mass lesion, or acute CVA. Vascular: There is atherosclerotic calcification of the cavernous carotid arteries bilaterally. Skull: Unremarkable Sinuses/Orbits: Chronic left maxillary sinusitis. Left mastoid effusion. Mild chronic right sphenoid sinusitis. Other: No supplemental non-categorized findings. IMPRESSION: 1. No acute intracranial findings. 2. Periventricular white matter and corona radiata hypodensities favor chronic  ischemic microvascular white matter disease. 3. Small single chronic/remote lacunar infarcts in the corona radiata bilaterally. 4. Chronic left maxillary sinusitis. Left mastoid effusion. Mild chronic right sphenoid sinusitis. Electronically Signed   By: Van Clines M.D.   On: 11/25/2020 18:55   MR BRAIN WO CONTRAST  Result Date: 11/26/2020 CLINICAL DATA:  Neurological deficit. Acute stroke suspected. Slurred speech. EXAM: MRI HEAD WITHOUT CONTRAST TECHNIQUE: Multiplanar, multiecho pulse sequences of the brain and surrounding structures were obtained without intravenous contrast. COMPARISON:  Head CT yesterday. FINDINGS: Brain: Diffusion imaging does not show any acute or subacute infarction. Chronic small-vessel ischemic changes affect pons. No focal cerebellar insult. Cerebral hemispheres show an old small vessel infarction in the right thalamus and extensive chronic small-vessel ischemic changes throughout the hemispheric white matter. There is an old right parietal cortical and subcortical infarction and an old cortical and subcortical infarction at the right parietal vertex. No intra-axial mass lesion, hemorrhage, hydrocephalus or extra-axial fluid collection. There is a small meningioma at the anterior aspect of the anterior cranial fossa on the right measuring 11 mm in diameter with a thickness 4-5 mm. No mass-effect upon the brain. Vascular: Major vessels at the base of the brain show flow. Skull and upper cervical spine: Negative Sinuses/Orbits: Paranasal sinuses are clear. Orbits are negative. There are bilateral mastoid effusions, more extensive on the left than the right. Other: None IMPRESSION: 1. No acute finding by MRI. Extensive chronic small-vessel ischemic changes throughout the brain. 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 2. 11 x 5 mm meningioma at the anterior aspect of the anterior cranial fossa on the right without mass-effect upon the brain. Electronically  Signed   By: Nelson Chimes M.D.   On: 11/26/2020 00:09   DG Chest  Portable 1 View  Result Date: 11/25/2020 CLINICAL DATA:  Altered mental status.  Slurred speech for 3 days. EXAM: PORTABLE CHEST 1 VIEW COMPARISON:  10/18/2020 FINDINGS: Airspace opacities are noted the right extending from the perihilar region to the lung base obscuring hemidiaphragm. There is additional opacity at the left lung base, mostly retrocardiac. Remainder of the lungs is clear. Right greater than left pleural effusions. No evidence of a pneumothorax. Cardiac silhouette is normal in size. No mediastinal or hilar masses. Skeletal structures are grossly intact. IMPRESSION: 1. Right greater than left pleural effusions associated with lung base opacity, consistent with atelectasis, as noted on the prior chest CT. No convincing pneumonia and no evidence of pulmonary edema. Overall without significant change from the prior chest radiograph/chest CT. Electronically Signed   By: Lajean Manes M.D.   On: 11/25/2020 19:35    Lab Data:  CBC: Recent Labs  Lab 11/25/20 1807 11/25/20 1910 11/26/20 0026 11/26/20 1621 11/27/20 0255  WBC 3.9*  --  2.5* 5.3 5.7  NEUTROABS 2.4  --   --   --   --   HGB 7.0* 8.2*  7.8* 9.8* 7.0* 7.2*  HCT 23.0* 24.0*  23.0* 31.7* 20.8* 22.1*  MCV 97.0  --  96.6 93.7 94.0  PLT 150  --  128* 151 0000000   Basic Metabolic Panel: Recent Labs  Lab 11/25/20 1807 11/25/20 1910 11/26/20 0026 11/26/20 1621 11/27/20 0255  NA 145 145  145  --  145 141  K 3.2* 3.3*  3.3*  --  3.5 3.3*  CL 104 103  --  105 102  CO2 27  --   --  28 27  GLUCOSE 274* 251*  --  230* 376*  BUN 80* 81*  --  79* 81*  CREATININE 7.35* 7.30* 7.49* 7.38* 7.32*  CALCIUM 7.1*  --   --  7.2* 7.3*   GFR: CrCl cannot be calculated (Unknown ideal weight.). Liver Function Tests: Recent Labs  Lab 11/25/20 1807  AST 28  ALT 14  ALKPHOS 61  BILITOT 0.5  PROT 5.8*  ALBUMIN 2.5*   No results for input(s): LIPASE, AMYLASE in the  last 168 hours. Recent Labs  Lab 11/25/20 1855  AMMONIA 16   Coagulation Profile: Recent Labs  Lab 11/25/20 1855  INR 1.3*   Cardiac Enzymes: No results for input(s): CKTOTAL, CKMB, CKMBINDEX, TROPONINI in the last 168 hours. BNP (last 3 results) No results for input(s): PROBNP in the last 8760 hours. HbA1C: No results for input(s): HGBA1C in the last 72 hours. CBG: Recent Labs  Lab 11/26/20 1256 11/26/20 1633 11/26/20 2138 11/27/20 0633 11/27/20 1128  GLUCAP 140* 215* 335* 256* 191*   Lipid Profile: No results for input(s): CHOL, HDL, LDLCALC, TRIG, CHOLHDL, LDLDIRECT in the last 72 hours. Thyroid Function Tests: Recent Labs    11/26/20 0021 11/26/20 1621  TSH 16.307*  --   FREET4  --  0.88   Anemia Panel: Recent Labs    11/27/20 0734  VITAMINB12 606  FOLATE 23.8  FERRITIN 162  TIBC 309  IRON 36*  RETICCTPCT 1.0   Urine analysis:    Component Value Date/Time   COLORURINE YELLOW 11/25/2020 2109   APPEARANCEUR CLEAR 11/25/2020 2109   LABSPEC 1.020 11/25/2020 2109   PHURINE 8.0 11/25/2020 2109   GLUCOSEU >=500 (A) 11/25/2020 2109   GLUCOSEU >=1000 (A) 12/08/2019 [place] 11/25/2020 2109   BILIRUBINUR NEGATIVE 11/25/2020 2109   BILIRUBINUR Negative 11/09/2019 0910  KETONESUR NEGATIVE 11/25/2020 2109   PROTEINUR >300 (A) 11/25/2020 2109   UROBILINOGEN 0.2 12/08/2019 1139   NITRITE NEGATIVE 11/25/2020 2109   LEUKOCYTESUR NEGATIVE 11/25/2020 2109     Arena Lindahl M.D. [facility] 11/27/2020, 2:19 PM   Call night coverage person covering after 7pm

## 2020-11-27 NOTE — Consult Note (Shared)
Nephrology Consult   Requesting provider: Estill Cotta, MD Service requesting consult: IM Reason for consult: AKI   Assessment/Recommendations: [name] [name] is a/an 75 y.o. male with a past medical history CKD stage IV, chronic anemia, T2DM, hypertension who presents with slurred speech and shortness of breath.   AKI on CKD stage IV: Creatinine on presentation of 7.35.  Baseline appears to be between 5 and 6.5 with some documented as high as the mid sevens.  -Continue to monitor daily Cr -Monitor Daily I/Os, Daily weight  -Maintain MAP>65 for optimal renal perfusion.  -Avoid nephrotoxic medications including NSAIDs and Vanc/Zosyn combo -Check Renal U/S to rule out obstruction*** -Currently no indication for HD***  Volume Status: Appears {volume status:54398} on exam. Based on our examination and review of available imaging, our recommendation is ***.  Hypertension:  Anemia due to {AnemiaCause:47750}: -Transfuse for Hgb<7 g/dL -No role for ESA in this setting  Hypothyroidism: On Synthroid, per primary  Type 2 diabetes: Per primary   Recommendations conveyed to primary service.    [facility] 11/27/2020 8:10 AM   _____________________________________________________________________________________ CC: Slurred speech/shortness of breath  History of Present Illness: [name] [name] is a/an 75 y.o. male with a past medical history of CKD stage IV, chronic anemia, T2DM, hypertension who presents with slurred speech and shortness of breath.    Medications:  Current Facility-Administered Medications  Medication Dose Route Frequency Provider Last Rate Last Admin  . 0.9 %  sodium chloride infusion   Intravenous PRN Mendel Corning, MD   Stopped at 11/26/20 2358  . acetaminophen (TYLENOL) tablet 650 mg  650 mg Oral Q6H PRN Rise Patience, MD       Or  . acetaminophen (TYLENOL) suppository 650 mg  650 mg Rectal Q6H PRN Rise Patience, MD       . amLODipine (NORVASC) tablet 10 mg  10 mg Oral Daily Rise Patience, MD   10 mg at 11/26/20 0902  . calcium acetate (PHOSLO) capsule 1,334 mg  1,334 mg Oral TID WC Rise Patience, MD   1,334 mg at 11/26/20 1713  . ceFEPIme (MAXIPIME) 1 g in sodium chloride 0.9 % 100 mL IVPB  1 g Intravenous Q24H Rise Patience, MD   Stopped at 11/26/20 2358  . Chlorhexidine Gluconate Cloth 2 % PADS 6 each  6 each Topical Daily Rai, Ripudeep K, MD   6 each at 11/26/20 1714  . docusate sodium (COLACE) capsule 100 mg  100 mg Oral BID Rise Patience, MD   100 mg at 11/26/20 2145  . feeding supplement (ENSURE ENLIVE / ENSURE PLUS) liquid 237 mL  237 mL Oral BID BM Rise Patience, MD   237 mL at 11/26/20 1449  . furosemide (LASIX) tablet 40 mg  40 mg Oral BID Rise Patience, MD   40 mg at 11/26/20 1714  . heparin injection 5,000 Units  5,000 Units Subcutaneous Q8H Rise Patience, MD   5,000 Units at 11/27/20 0537  . hydrALAZINE (APRESOLINE) tablet 100 mg  100 mg Oral Q8H Rise Patience, MD   100 mg at 11/27/20 0537  . hyoscyamine (LEVSIN SL) SL tablet 0.125 mg  0.125 mg Sublingual Q4H PRN Rise Patience, MD      . insulin aspart (novoLOG) injection 0-9 Units  0-9 Units Subcutaneous TID WC Rise Patience, MD   3 Units at 11/26/20 1714  . insulin glargine (LANTUS) injection 5 Units  5 Units Subcutaneous QHS Hal Hope,  Doreatha Lew, MD   5 Units at 11/26/20 2146  . levothyroxine (SYNTHROID) tablet 25 mcg  25 mcg Oral Once Rai, Ripudeep K, MD      . levothyroxine (SYNTHROID) tablet 50 mcg  50 mcg Oral QAC breakfast Rai, Ripudeep K, MD   50 mcg at 11/27/20 0537  . LORazepam (ATIVAN) tablet 0.5 mg  0.5 mg Oral Q4H PRN Rise Patience, MD      . sodium bicarbonate tablet 650 mg  650 mg Oral BID Rise Patience, MD   650 mg at 11/26/20 2145  . vitamin B-12 (CYANOCOBALAMIN) tablet 1,000 mcg  1,000 mcg Oral Daily Rise Patience, MD   1,000 mcg at 11/26/20  [id number]     ALLERGIES Patient has no known allergies.  MEDICAL HISTORY Past Medical History:  Diagnosis Date  . 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 Closed comminuted intertrochanteric fracture of left femur ([place])   . Diabetes mellitus without complication ([place])   . Diabetic hyperosmolar non-ketotic state ([place]) 05/15/2016  . DKA (diabetic ketoacidoses) 05/09/2016  . Hypertension   . Hypoglycemia 11/29/2019  . Postoperative anemia due to acute blood loss 06/18/2016  . Retroperitoneal hematoma 06/18/2016  . Stroke ([place])   . Vitamin B 12 deficiency 06/18/2016     SOCIAL HISTORY Social History   Socioeconomic History  . Marital status: Single    Spouse name: Not on file  . Number of children: 2  . Years of education: 14  . Highest education level: Not on file  Occupational History  . Occupation: Retired  Tobacco Use  . Smoking status: Never Smoker  . Smokeless tobacco: Never Used  Vaping Use  . Vaping Use: Never used  Substance and Sexual Activity  . Alcohol use: No  . Drug use: No  . Sexual activity: Not Currently  Other Topics Concern  . Not on file  Social History Narrative   Fun: Likes to do handyman related projects, photography.    Denies religious beliefs effecting health care.    Social Determinants of Health   Financial Resource Strain: Low Risk   . Difficulty of Paying Living Expenses: Not very hard  Food Insecurity: Food Insecurity Present  . Worried About Charity fundraiser in the Last Year: Sometimes true  . Ran Out of Food in the Last Year: Never true  Transportation Needs: No Transportation Needs  . Lack of Transportation (Medical): No  . Lack of Transportation (Non-Medical): No  Physical Activity: Inactive  . Days of Exercise per Week: 0 days  . Minutes of Exercise per Session: 0 min  Stress: No Stress Concern Present  . Feeling of Stress : Not at all  Social Connections: Socially Isolated  .  Frequency of Communication with Friends and Family: More than three times a week  . Frequency of Social Gatherings with Friends and Family: More than three times a week  . Attends Religious Services: Never  . Active Member of Clubs or Organizations: No  . Attends Archivist Meetings: Never  . Marital Status: Never married  Intimate Partner Violence: Not At Risk  . Fear of Current or Ex-Partner: No  . Emotionally Abused: No  . Physically Abused: No  . Sexually Abused: No     FAMILY HISTORY Family History  Problem Relation Age of Onset  . Alzheimer's disease Mother   . Hypertension Mother   . Hyperlipidemia Father   . Hypertension Father   .  Healthy Maternal Grandmother   . Pneumonia Maternal Grandfather       Review of Systems: 12 systems reviewed Otherwise as per HPI, all other systems reviewed and negative  Physical Exam: Vitals:   11/27/20 0058 11/27/20 0446  BP: (!) 161/82 (!) 167/91  Pulse: 83 84  Resp: 18 16  Temp: 98.5 F (36.9 C) 98.3 F (36.8 C)  SpO2: 96% 96%   Total I/O In: 240 [P.O.:240] Out: -   Intake/Output Summary (Last 24 hours) at 11/27/2020 0810 Last data filed at 11/27/2020 0809 Gross per 24 hour  Intake 1244.5 ml  Output 925 ml  Net 319.5 ml   General: well-appearing, no acute distress HEENT: anicteric sclera, oropharynx clear without lesions CV: regular rate, normal rhythm, no murmurs, no gallops, no rubs, ***no peripheral edema Lungs: ***clear to auscultation bilaterally, normal work of breathing Abd: soft, non-tender, non-distended Skin: no visible lesions or rashes Psych: alert, engaged, appropriate mood and affect Musculoskeletal: ***no obvious deformities Neuro: normal speech, no gross focal deficits   Test Results Reviewed Lab Results  Component Value Date   NA 141 11/27/2020   K 3.3 (L) 11/27/2020   CL 102 11/27/2020   CO2 27 11/27/2020   BUN 81 (H) 11/27/2020   CREATININE 7.32 (H) 11/27/2020   GFR 29.15 (L)  12/08/2019   CALCIUM 7.3 (L) 11/27/2020   ALBUMIN 2.5 (L) 11/25/2020   PHOS 7.3 (H) 10/19/2020   PHOS 7.4 (H) 10/19/2020     I have reviewed all relevant outside healthcare records related to the patient's current hospitalization

## 2020-11-27 NOTE — Plan of Care (Signed)
  Problem: Nutrition: Goal: Adequate nutrition will be maintained Outcome: Progressing   

## 2020-11-27 NOTE — Consult Note (Signed)
WOC Nurse Consult Note: Patient receiving care in [facility] [phone]. Reason for Consult: wound RLE shin Wound type: small, scattered abrasions Pressure Injury POA: Yes/No/NA Measurement: na Wound bed: pink Drainage (amount, consistency, odor) none Periwound: intact Dressing procedure/placement/frequency: Wash abrasions on RLE shin with soap and water, pat dry. Place a foam dressing over the area. The foam can be used up to 3 days. Monitor the wound area(s) for worsening of condition such as: Signs/symptoms of infection,  Increase in size,  Development of or worsening of odor, Development of pain, or increased pain at the affected locations.  Notify the medical team if any of these develop.  Thank you for the consult. [place] nurse will not follow at this time.  Please re-consult the [place] team if needed.  Val Riles, RN, MSN, CWOCN, CNS-BC, pager [phone]

## 2020-11-27 NOTE — Progress Notes (Addendum)
Inpatient Diabetes Program Recommendations  AACE/ADA: New Consensus Statement on Inpatient Glycemic Control (2015)  Target Ranges:  Prepandial:   less than 140 mg/dL      Peak postprandial:   less than 180 mg/dL (1-2 hours)      Critically ill patients:  140 - 180 mg/dL   Lab Results  Component Value Date   GLUCAP 256 (H) 11/27/2020   HGBA1C 10.0 (H) 10/19/2020    Review of Glycemic Control  Diabetes history: DM 2 Outpatient Diabetes medications: Basaglar 5 units, Humalog 0-10 units tid Current orders for Inpatient glycemic control:  Lantus 10 units Novolog 0-9 units  Ensure Enlive bid between meals  Inpatient Diabetes Program Recommendations:    Called Pt's sister to get more information on how pt was taking his medication at home. Left voice message. Pt receiving assistance from sisters at home due to medical issues and cognitive impairment. A1c 10%, Sees Dr. Kelton Pillar for DM management last visit on 07/26/20. Pt would benefit from better glucose control, however would not be aggressive at this time.   1000 am:   Spoke with both sisters on speaker phone. They help pt check glucose and giving him medications. They report he is non compliant with his diet. Family members bring him doughnuts and other things he should not eat. They also report they take care of their 15 year old mother and just had a brother with down syndrome pass that they also had cared for.  They say they end up being the bad guys trying to keep pt from eating ites he should not.  Discussed his A1c level. Pt has not had hypoglycemia recently. May need insulin adjustments at home and suggested they follow up with Dr. Kelton Pillar outpatient.  Thanks,  Tama Headings RN, MSN, BC-ADM Inpatient Diabetes Coordinator Team Pager [phone] (8a-5p)

## 2020-11-28 DIAGNOSIS — T68XXXA Hypothermia, initial encounter: Secondary | ICD-10-CM | POA: Diagnosis not present

## 2020-11-28 DIAGNOSIS — G9341 Metabolic encephalopathy: Secondary | ICD-10-CM | POA: Diagnosis not present

## 2020-11-28 DIAGNOSIS — D638 Anemia in other chronic diseases classified elsewhere: Secondary | ICD-10-CM | POA: Diagnosis not present

## 2020-11-28 DIAGNOSIS — R4182 Altered mental status, unspecified: Secondary | ICD-10-CM | POA: Diagnosis not present

## 2020-11-28 LAB — GLUCOSE, CAPILLARY
Glucose-Capillary: 70 mg/dL (ref 70–99)
Glucose-Capillary: 171 mg/dL — ABNORMAL HIGH (ref 70–99)
Glucose-Capillary: 212 mg/dL — ABNORMAL HIGH (ref 70–99)

## 2020-11-28 LAB — BPAM RBC
Blood Product Expiration Date: 202202192359
ISSUE DATE / TIME: 202202151119
Unit Type and Rh: 1700

## 2020-11-28 LAB — BASIC METABOLIC PANEL
Anion gap: 13 (ref 5–15)
BUN: 81 mg/dL — ABNORMAL HIGH (ref 8–23)
CO2: 24 mmol/L (ref 22–32)
Calcium: 7.8 mg/dL — ABNORMAL LOW (ref 8.9–10.3)
Chloride: 105 mmol/L (ref 98–111)
Creatinine, Ser: 6.79 mg/dL — ABNORMAL HIGH (ref 0.61–1.24)
GFR, Estimated: 8 mL/min — ABNORMAL LOW (ref >60)
Glucose, Bld: 117 mg/dL — ABNORMAL HIGH (ref 70–99)
Potassium: 3.8 mmol/L (ref 3.5–5.1)
Sodium: 142 mmol/L (ref 135–145)

## 2020-11-28 LAB — TYPE AND SCREEN
ABO/RH(D): B POS
Antibody Screen: NEGATIVE
Unit division: 0

## 2020-11-28 LAB — VANCOMYCIN, RANDOM: Vancomycin Rm: 16

## 2020-11-28 LAB — CBC
HCT: 27.6 % — ABNORMAL LOW (ref 39.0–52.0)
Hemoglobin: 9.1 g/dL — ABNORMAL LOW (ref 13.0–17.0)
MCH: 30.5 pg (ref 26.0–34.0)
MCHC: 33 g/dL (ref 30.0–36.0)
MCV: 92.6 fL (ref 80.0–100.0)
Platelets: 149 10*3/uL — ABNORMAL LOW (ref 150–400)
RBC: 2.98 MIL/uL — ABNORMAL LOW (ref 4.22–5.81)
RDW: 15.3 % (ref 11.5–15.5)
WBC: 6.2 10*3/uL (ref 4.0–10.5)
nRBC: 0 % (ref 0.0–0.2)

## 2020-11-28 MED ORDER — LEVOTHYROXINE SODIUM 50 MCG PO TABS: 50.0000 ug | ORAL_TABLET | Freq: Every day | ORAL | 2 refills | Status: DC

## 2020-11-28 MED ORDER — AMOXICILLIN-POT CLAVULANATE 875-125 MG PO TABS: 1.0000 | ORAL_TABLET | Freq: Two times a day (BID) | ORAL | 0 refills | Status: DC

## 2020-11-28 MED ORDER — AMOXICILLIN-POT CLAVULANATE 875-125 MG PO TABS: 1.0000 | ORAL_TABLET | Freq: Every day | ORAL | 0 refills | Status: AC

## 2020-11-28 NOTE — Progress Notes (Signed)
DISCHARGE NOTE HOME Royer Maisano to be discharged Home per MD order. Discussed prescriptions and follow up appointments with the patient. Prescriptions given to patient; medication list explained in detail. Patient verbalized understanding.  Skin clean, dry and intact without evidence of skin break down, no evidence of skin tears noted. IV catheter discontinued intact. Site without signs and symptoms of complications. Dressing and pressure applied. Pt denies pain at the site currently. No complaints noted.  Patient free of lines and wounds. Patient discharged with foley in place.  An After Visit Summary (AVS) was printed and given to the patient. Patient escorted via EMS, and stretcher.  Vira Agar, RN

## 2020-11-28 NOTE — Plan of Care (Signed)

## 2020-11-28 NOTE — Clinical Social Work Note (Signed)
Patient discharging home today by EMS transport. He is a hospice patient and will continue to be followed by [facility]. Sister Kylle Foskett contacted [phone]) after EMS arrived to inform her that patient would be leaving the hospital shortly.    Lilja Soland Givens, MSW, LCSW Licensed Clinical Social Worker [place] [phone]

## 2020-11-28 NOTE — Progress Notes (Signed)
Inpatient Diabetes Program Recommendations  AACE/ADA: New Consensus Statement on Inpatient Glycemic Control (2015)  Target Ranges:  Prepandial:   less than 140 mg/dL      Peak postprandial:   less than 180 mg/dL (1-2 hours)      Critically ill patients:  140 - 180 mg/dL   Lab Results  Component Value Date   GLUCAP 70 11/28/2020   HGBA1C 10.0 (H) 10/19/2020    Review of Glycemic Control Results for [name] [name], [name] [name] (MRN [id number]) as of 11/28/2020 10:35  Ref. Range 11/27/2020 06:33 11/27/2020 11:28 11/27/2020 16:35 11/27/2020 22:01 11/28/2020 06:49  Glucose-Capillary Latest Ref Range: 70 - 99 mg/dL 256 (H) 191 (H) 223 (H) 151 (H) 70   Diabetes history: DM 2 Outpatient Diabetes medications: Basaglar 5 units, Humalog 0-10 units tid Current orders for Inpatient glycemic control:  Lantus 15 units Novolog 0-9 units tid  Ensure enlive bid between meals  Inpatient Diabetes Program Recommendations:    Glucose trends elevated yesterday Lantus increased.  -  Fasting glucose 70. To prevent hypoglycemia reduce Lantus to 12 units.   Thanks,  Tama Headings RN, MSN, BC-ADM Inpatient Diabetes Coordinator Team Pager [phone] (8a-5p)

## 2020-11-28 NOTE — Discharge Summary (Addendum)
Physician Discharge Summary  [name] [name] TDS:[id number] DOB: June 21, 1946 DOA: 11/25/2020  PCP: Libby Maw, MD  Admit date: 11/25/2020 Discharge date: 11/28/2020  Time spent: 50 minutes  Recommendations for Outpatient Follow-up:  1. Follow-up hospice at home  Discharge Diagnoses:  Principal Problem:   Hypothermia Active Problems:   Anemia of chronic disease   Uncontrolled type 2 diabetes mellitus with diabetic nephropathy, with long-term current use of insulin ([name])   CKD (chronic kidney disease), stage V ([name])   Acute metabolic encephalopathy   Dementia ([name])   CKD (chronic kidney disease) stage 4, GFR 15-29 ml/min ([name])   Hypothyroidism   Fluid overload   Discharge Condition: Stable  Diet recommendation: Regular diet  There were no vitals filed for this visit.  History of present illness:  75 year old male with history of CKD stage IV, chronic anemia, diabetes mellitus type 2, hypertension, prior CVA was noted to have slurred speech, shortness of breath for past 24 hours.  Family also reported decline in mental status for 1 week. In the ED patient was found to be hypothermic with temperature 93.2 F.  Bear hugger was provided and patient was admitted for hypothermia and volume overload.  Hospital Course:   Hypothermia-resolved likely from cold exposure.  Patient was provided the regular, at this time hypothermia has resolved.  Blood cultures are negative.  Lactic acid 2.0, ammonia level 16.  TSH was 16.30, dose of Synthroid increased to 50 mcg daily.  ?  Pneumonia-with questionable pneumonia on chest x-ray, patient was empirically started on vancomycin and cefepime, antibiotics for this ED escalated.  At this time we will discharge him on Augmentin 1 tablet p.o. twice daily for 3 more days to complete 7 days of treatment.  Hypothyroidism-patient is on Synthroid 25 mcg daily, TSH 16.3 which is increased to 50 mcg daily.  T3 was 47, T4 0.8.  CKD stage IV with fluid  overload-patient was given IV Lasix 40 mg, p.o. Lasix at home dose was resumed 40 mg twice daily.  No plan for hemodialysis.  Continue sodium bicarbonate, PhosLo.  Patient is under hospice care..  Anemia of chronic disease/CKD stage IV-Baseline hemoglobin around 8.  FOBT is negative.  Hemoglobin down to 7.2.  Discussed with nephrology, patient under hospice care.  Hold off on ESA.  Patient received 1 unit PRBC.  Today hemoglobin is 9.1.  Stable  Diabetes mellitus type 2-continue home medications.  Mildly elevated troponin-likely from volume overload, CKD stage IV.  No chest pain or shortness of breath.  Dementia-at baseline  Hypertension-continue Norvasc, hydralazine, Lasix.  Procedures:    Consultations:    Discharge Exam: Vitals:   11/28/20 0454 11/28/20 0907  BP: (!) 146/73 121/64  Pulse: 72 79  Resp: 14 18  Temp: 97.8 F (36.6 C) 97.8 F (36.6 C)  SpO2: 96% 98%    General: Appears in no acute distress Cardiovascular: S1-S2, regular Respiratory: Clear to auscultation bilaterally  Discharge Instructions   Discharge Instructions    Diet - low sodium heart healthy   Complete by: As directed    Discharge wound care:   Complete by: As directed    Wash abrasions on right lower extremity shin with soap and water, pat dry.  Please foam dressing over the area.   Increase activity slowly   Complete by: As directed    No wound care   Complete by: As directed      Allergies as of 11/28/2020   No Known Allergies     Medication  List    TAKE these medications   Accu-Chek Guide w/Device Kit 1 Device by Does not apply route QID.   acetaminophen 325 MG tablet Commonly known as: Tylenol Take 2 tablets (650 mg total) by mouth every 6 (six) hours as needed for up to 30 doses for mild pain or moderate pain.   albuterol (2.5 MG/3ML) 0.083% nebulizer solution Commonly known as: PROVENTIL Take 2.5 mg by nebulization every 6 (six) hours as needed for shortness of breath or  wheezing.   amLODipine 10 MG tablet Commonly known as: NORVASC Take 1 tablet (10 mg total) by mouth daily.   amoxicillin-clavulanate 875-125 MG tablet Commonly known as: Augmentin Take 1 tablet by mouth 2 (two) times daily for 3 days.   atorvastatin 40 MG tablet Commonly known as: LIPITOR Take 40 mg by mouth daily.   Basaglar KwikPen 100 UNIT/ML Inject 5 Units into the skin at bedtime.   calcium acetate 667 MG capsule Commonly known as: PHOSLO Take 2 capsules (1,334 mg total) by mouth 3 (three) times daily with meals.   cyanocobalamin 1000 MCG tablet Take 1 tablet (1,000 mcg total) by mouth daily.   docusate sodium 100 MG capsule Commonly known as: COLACE Take 1 capsule (100 mg total) by mouth 2 (two) times daily. What changed:   when to take this  reasons to take this   feeding supplement Liqd Take 237 mLs by mouth 2 (two) times daily between meals.   furosemide 40 MG tablet Commonly known as: LASIX Take 40 mg by mouth 2 (two) times daily.   glucose blood test strip Commonly known as: True Metrix Blood Glucose Test CHECK BLOOD SUGAR UP TO 4 TIMES A DAY. What changed:   how much to take  how to take this  when to take this  additional instructions   glucose blood test strip Commonly known as: Accu-Chek Guide Use as instructed What changed: Another medication with the same name was changed. Make sure you understand how and when to take each.   Accu-Chek Guide test strip Generic drug: glucose blood USE AS DIRECTED What changed: Another medication with the same name was changed. Make sure you understand how and when to take each.   hydrALAZINE 100 MG tablet Commonly known as: APRESOLINE TAKE 1 TABLET BY MOUTH 3 TIMES A DAY.   hyoscyamine 0.125 MG SL tablet Commonly known as: LEVSIN SL Place 1 tablet (0.125 mg total) under the tongue every 4 (four) hours as needed (excess oral secretions).   insulin lispro 100 UNIT/ML KwikPen Commonly known as:  HumaLOG KwikPen Inject 0-10 Units into the skin 3 (three) times daily as needed (blood sugar). Max daily 30 units   Insulin Pen Needle 32G X 4 MM Misc 1 Device by Does not apply route in the morning, at noon, in the evening, and at bedtime.   Insulin Syringe-Needle U-100 31G X 5/16" 0.3 ML Misc Commonly known as: BD Insulin Syringe U/F Inject 1 each as directed 2 (two) times daily. as directed   levothyroxine 50 MCG tablet Commonly known as: SYNTHROID Take 1 tablet (50 mcg total) by mouth daily before breakfast. Start taking on: November 29, 2020 What changed:   medication strength  See the new instructions.   LORazepam 0.5 MG tablet Commonly known as: ATIVAN Take 1 tablet (0.5 mg total) by mouth every 4 (four) hours as needed for anxiety. May crush, mix with water and give sublingually if needed.   oxyCODONE 5 MG immediate release tablet Commonly known  as: Oxy IR/ROXICODONE Take 1 tablet (5 mg total) by mouth every 4 (four) hours as needed for severe pain. May crush, mix with water and give sublingually if needed.   prochlorperazine 10 MG tablet Commonly known as: COMPAZINE Take 1 tablet (10 mg total) by mouth every 4 (four) hours as needed for nausea or vomiting. May crush, mix with water and give sublingually.   sodium bicarbonate 650 MG tablet Take 1 tablet (650 mg total) by mouth 2 (two) times daily.            Discharge Care Instructions  (From admission, onward)         Start     Ordered   11/28/20 0000  Discharge wound care:       Comments: Wash abrasions on right lower extremity shin with soap and water, pat dry.  Please foam dressing over the area.   11/28/20 1135         No Known Allergies    The results of significant diagnostics from this hospitalization (including imaging, microbiology, ancillary and laboratory) are listed below for reference.    Significant Diagnostic Studies: CT HEAD WO CONTRAST  Result Date: 11/25/2020 CLINICAL DATA:   Slurred speech for 3 days. Decline in mental status for 1 week. Multiple falls. EXAM: CT HEAD WITHOUT CONTRAST TECHNIQUE: Contiguous axial images were obtained from the base of the skull through the vertex without intravenous contrast. COMPARISON:  10/18/2020 FINDINGS: Brain: Small single remote right and left lacunar infarcts in the corona radiata bilaterally. Periventricular white matter and corona radiata hypodensities favor chronic ischemic microvascular white matter disease. Otherwise, the brainstem, cerebellum, cerebral peduncles, thalamus, basal ganglia, basilar cisterns, and ventricular system appear within normal limits. No intracranial hemorrhage, mass lesion, or acute CVA. Vascular: There is atherosclerotic calcification of the cavernous carotid arteries bilaterally. Skull: Unremarkable Sinuses/Orbits: Chronic left maxillary sinusitis. Left mastoid effusion. Mild chronic right sphenoid sinusitis. Other: No supplemental non-categorized findings. IMPRESSION: 1. No acute intracranial findings. 2. Periventricular white matter and corona radiata hypodensities favor chronic ischemic microvascular white matter disease. 3. Small single chronic/remote lacunar infarcts in the corona radiata bilaterally. 4. Chronic left maxillary sinusitis. Left mastoid effusion. Mild chronic right sphenoid sinusitis. Electronically Signed   By: Van Clines M.D.   On: 11/25/2020 18:55   MR BRAIN WO CONTRAST  Result Date: 11/26/2020 CLINICAL DATA:  Neurological deficit. Acute stroke suspected. Slurred speech. EXAM: MRI HEAD WITHOUT CONTRAST TECHNIQUE: Multiplanar, multiecho pulse sequences of the brain and surrounding structures were obtained without intravenous contrast. COMPARISON:  Head CT yesterday. FINDINGS: Brain: Diffusion imaging does not show any acute or subacute infarction. Chronic small-vessel ischemic changes affect pons. No focal cerebellar insult. Cerebral hemispheres show an old small vessel infarction in  the right thalamus and extensive chronic small-vessel ischemic changes throughout the hemispheric white matter. There is an old right parietal cortical and subcortical infarction and an old cortical and subcortical infarction at the right parietal vertex. No intra-axial mass lesion, hemorrhage, hydrocephalus or extra-axial fluid collection. There is a small meningioma at the anterior aspect of the anterior cranial fossa on the right measuring 11 mm in diameter with a thickness 4-5 mm. No mass-effect upon the brain. Vascular: Major vessels at the base of the brain show flow. Skull and upper cervical spine: Negative Sinuses/Orbits: Paranasal sinuses are clear. Orbits are negative. There are bilateral mastoid effusions, more extensive on the left than the right. Other: None IMPRESSION: 1. No acute finding by MRI. Extensive chronic small-vessel ischemic changes  throughout the brain. 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 2. 11 x 5 mm meningioma at the anterior aspect of the anterior cranial fossa on the right without mass-effect upon the brain. Electronically Signed   By: Nelson Chimes M.D.   On: 11/26/2020 00:09   DG CHEST PORT 1 VIEW  Result Date: 11/27/2020 CLINICAL DATA:  Pneumonia EXAM: PORTABLE CHEST 1 VIEW COMPARISON:  September 24, 2021 FINDINGS: The heart size is unchanged. There are bilateral pleural effusions. There is persistent bibasilar airspace disease which has progressed since the prior study. There is no pneumothorax. No acute osseous abnormality. IMPRESSION: Persistent but growing bilateral pleural effusions. Persistent and worsening bibasilar airspace disease. Electronically Signed   By: Constance Holster M.D.   On: 11/27/2020 16:21   DG Chest Portable 1 View  Result Date: 11/25/2020 CLINICAL DATA:  Altered mental status.  Slurred speech for 3 days. EXAM: PORTABLE CHEST 1 VIEW COMPARISON:  10/18/2020 FINDINGS: Airspace opacities are noted the right extending from the  perihilar region to the lung base obscuring hemidiaphragm. There is additional opacity at the left lung base, mostly retrocardiac. Remainder of the lungs is clear. Right greater than left pleural effusions. No evidence of a pneumothorax. Cardiac silhouette is normal in size. No mediastinal or hilar masses. Skeletal structures are grossly intact. IMPRESSION: 1. Right greater than left pleural effusions associated with lung base opacity, consistent with atelectasis, as noted on the prior chest CT. No convincing pneumonia and no evidence of pulmonary edema. Overall without significant change from the prior chest radiograph/chest CT. Electronically Signed   By: Lajean Manes M.D.   On: 11/25/2020 19:35    Microbiology: Recent Results (from the past 240 hour(s))  Resp Panel by RT-PCR (Flu A&B, Covid) Nasopharyngeal Swab     Status: None   Collection Time: 11/25/20  6:55 PM   Specimen: Nasopharyngeal Swab; Nasopharyngeal(NP) swabs in vial transport medium  Result Value Ref Range Status   SARS Coronavirus 2 by RT PCR NEGATIVE NEGATIVE Final    Comment: (NOTE) SARS-CoV-2 target nucleic acids are NOT DETECTED.  The SARS-CoV-2 RNA is generally detectable in upper respiratory specimens during the acute phase of infection. The lowest concentration of SARS-CoV-2 viral copies this assay can detect is 138 copies/mL. A negative result does not preclude SARS-Cov-2 infection and should not be used as the sole basis for treatment or other patient management decisions. A negative result may occur with  improper specimen collection/handling, submission of specimen other than nasopharyngeal swab, presence of viral mutation(s) within the areas targeted by this assay, and inadequate number of viral copies(<138 copies/mL). A negative result must be combined with clinical observations, patient history, and epidemiological information. The expected result is Negative.  Fact Sheet for Patients:   EntrepreneurPulse.com.au  Fact Sheet for Healthcare Providers:  IncredibleEmployment.be  This test is no t yet approved or cleared by the [place] FDA and  has been authorized for detection and/or diagnosis of SARS-CoV-2 by FDA under an Emergency Use Authorization (EUA). This EUA will remain  in effect (meaning this test can be used) for the duration of the COVID-19 declaration under Section 564(b)(1) of the Act, 21 U.S.C.section 360bbb-3(b)(1), unless the authorization is terminated  or revoked sooner.       Influenza A by PCR NEGATIVE NEGATIVE Final   Influenza B by PCR NEGATIVE NEGATIVE Final    Comment: (NOTE) The Xpert Xpress SARS-CoV-2/FLU/RSV plus assay is intended as an aid in the diagnosis of influenza from Nasopharyngeal swab  specimens and should not be used as a sole basis for treatment. Nasal washings and aspirates are unacceptable for Xpert Xpress SARS-CoV-2/FLU/RSV testing.  Fact Sheet for Patients: EntrepreneurPulse.com.au  Fact Sheet for Healthcare Providers: IncredibleEmployment.be  This test is not yet approved or cleared by the [place] FDA and has been authorized for detection and/or diagnosis of SARS-CoV-2 by FDA under an Emergency Use Authorization (EUA). This EUA will remain in effect (meaning this test can be used) for the duration of the COVID-19 declaration under Section 564(b)(1) of the Act, 21 U.S.C. section 360bbb-3(b)(1), unless the authorization is terminated or revoked.  Performed at [facility], [place] [address], [place], [place] [address]   Culture, blood (single)     Status: None (Preliminary result)   Collection Time: 11/25/20  7:24 PM   Specimen: BLOOD  Result Value Ref Range Status   Specimen Description BLOOD RIGHT HAND  Final   Special Requests   Final    BOTTLES DRAWN AEROBIC AND ANAEROBIC Blood Culture adequate volume   Culture   Final    NO  GROWTH 3 DAYS Performed at [facility], [place] [address] [place], [place] [address]    Report Status PENDING  Incomplete     Labs: Basic Metabolic Panel: Recent Labs  Lab 11/25/20 1807 11/25/20 1910 11/26/20 0026 11/26/20 1621 11/27/20 0255 11/28/20 0050  NA 145 145  145  --  145 141 142  K 3.2* 3.3*  3.3*  --  3.5 3.3* 3.8  CL 104 103  --  105 102 105  CO2 27  --   --  '28 27 24  ' GLUCOSE 274* 251*  --  230* 376* 117*  BUN 80* 81*  --  79* 81* 81*  CREATININE 7.35* 7.30* 7.49* 7.38* 7.32* 6.79*  CALCIUM 7.1*  --   --  7.2* 7.3* 7.8*   Liver Function Tests: Recent Labs  Lab 11/25/20 1807  AST 28  ALT 14  ALKPHOS 61  BILITOT 0.5  PROT 5.8*  ALBUMIN 2.5*   No results for input(s): LIPASE, AMYLASE in the last 168 hours. Recent Labs  Lab 11/25/20 1855  AMMONIA 16   CBC: Recent Labs  Lab 11/25/20 1807 11/25/20 1910 11/26/20 0026 11/26/20 1621 11/27/20 0255 11/27/20 1812 11/28/20 0050  WBC 3.9*  --  2.5* 5.3 5.7  --  6.2  NEUTROABS 2.4  --   --   --   --   --   --   HGB 7.0*   < > 9.8* 7.0* 7.2* 9.2* 9.1*  HCT 23.0*   < > 31.7* 20.8* 22.1* 28.5* 27.6*  MCV 97.0  --  96.6 93.7 94.0  --  92.6  PLT 150  --  128* 151 158  --  149*   < > = values in this interval not displayed.   Cardiac Enzymes: No results for input(s): CKTOTAL, CKMB, CKMBINDEX, TROPONINI in the last 168 hours. BNP: BNP (last 3 results) Recent Labs    08/18/20 2330 10/18/20 1519 11/25/20 1807  BNP 206.9* 1,167.4* 1,100.2*    ProBNP (last 3 results) No results for input(s): PROBNP in the last 8760 hours.  CBG: Recent Labs  Lab 11/27/20 1128 11/27/20 1635 11/27/20 2201 11/28/20 0649 11/28/20 1130  GLUCAP 191* 223* 151* 70 171*       Signed:  Oswald Hillock MD.  [facility] 11/28/2020, 1:58 PM

## 2020-11-30 LAB — CULTURE, BLOOD (SINGLE)
Culture: NO GROWTH
Special Requests: ADEQUATE

## 2020-12-02 ENCOUNTER — Other Ambulatory Visit: Payer: Self-pay

## 2020-12-02 ENCOUNTER — Emergency Department (HOSPITAL_COMMUNITY)

## 2020-12-02 ENCOUNTER — Inpatient Hospital Stay (HOSPITAL_COMMUNITY)
Admission: EM | Admit: 2020-12-02 | Discharge: 2020-12-06 | DRG: 643 | Disposition: A | Discharge status: Hospice | Attending: Internal Medicine | Admitting: Internal Medicine

## 2020-12-02 ENCOUNTER — Encounter (HOSPITAL_COMMUNITY): Payer: Self-pay | Admitting: Emergency Medicine

## 2020-12-02 DIAGNOSIS — E876 Hypokalemia: Secondary | ICD-10-CM

## 2020-12-02 DIAGNOSIS — I69319 Unspecified symptoms and signs involving cognitive functions following cerebral infarction: Secondary | ICD-10-CM

## 2020-12-02 DIAGNOSIS — Z820 Family history of epilepsy and other diseases of the nervous system: Secondary | ICD-10-CM | POA: Diagnosis not present

## 2020-12-02 DIAGNOSIS — Z66 Do not resuscitate: Secondary | ICD-10-CM | POA: Diagnosis present

## 2020-12-02 DIAGNOSIS — Z83438 Family history of other disorder of lipoprotein metabolism and other lipidemia: Secondary | ICD-10-CM

## 2020-12-02 DIAGNOSIS — Z96641 Presence of right artificial hip joint: Secondary | ICD-10-CM | POA: Diagnosis present

## 2020-12-02 DIAGNOSIS — D631 Anemia in chronic kidney disease: Secondary | ICD-10-CM | POA: Diagnosis present

## 2020-12-02 DIAGNOSIS — I132 Hypertensive heart and chronic kidney disease with heart failure and with stage 5 chronic kidney disease, or end stage renal disease: Secondary | ICD-10-CM | POA: Diagnosis present

## 2020-12-02 DIAGNOSIS — N185 Chronic kidney disease, stage 5: Secondary | ICD-10-CM | POA: Diagnosis not present

## 2020-12-02 DIAGNOSIS — Z8249 Family history of ischemic heart disease and other diseases of the circulatory system: Secondary | ICD-10-CM | POA: Diagnosis not present

## 2020-12-02 DIAGNOSIS — T68XXXA Hypothermia, initial encounter: Secondary | ICD-10-CM | POA: Diagnosis not present

## 2020-12-02 DIAGNOSIS — Z781 Physical restraint status: Secondary | ICD-10-CM

## 2020-12-02 DIAGNOSIS — R680 Hypothermia, not associated with low environmental temperature: Secondary | ICD-10-CM | POA: Diagnosis not present

## 2020-12-02 DIAGNOSIS — E1122 Type 2 diabetes mellitus with diabetic chronic kidney disease: Secondary | ICD-10-CM | POA: Diagnosis present

## 2020-12-02 DIAGNOSIS — Z20822 Contact with and (suspected) exposure to covid-19: Secondary | ICD-10-CM | POA: Diagnosis present

## 2020-12-02 DIAGNOSIS — E039 Hypothyroidism, unspecified: Secondary | ICD-10-CM | POA: Diagnosis not present

## 2020-12-02 DIAGNOSIS — Z7189 Other specified counseling: Secondary | ICD-10-CM

## 2020-12-02 DIAGNOSIS — Z79890 Hormone replacement therapy (postmenopausal): Secondary | ICD-10-CM

## 2020-12-02 DIAGNOSIS — I5043 Acute on chronic combined systolic (congestive) and diastolic (congestive) heart failure: Secondary | ICD-10-CM | POA: Diagnosis not present

## 2020-12-02 DIAGNOSIS — D649 Anemia, unspecified: Secondary | ICD-10-CM | POA: Diagnosis present

## 2020-12-02 DIAGNOSIS — Z794 Long term (current) use of insulin: Secondary | ICD-10-CM | POA: Diagnosis not present

## 2020-12-02 DIAGNOSIS — Z515 Encounter for palliative care: Secondary | ICD-10-CM | POA: Diagnosis not present

## 2020-12-02 DIAGNOSIS — J9601 Acute respiratory failure with hypoxia: Secondary | ICD-10-CM

## 2020-12-02 DIAGNOSIS — E538 Deficiency of other specified B group vitamins: Secondary | ICD-10-CM | POA: Diagnosis present

## 2020-12-02 DIAGNOSIS — Z79899 Other long term (current) drug therapy: Secondary | ICD-10-CM

## 2020-12-02 DIAGNOSIS — E8770 Fluid overload, unspecified: Secondary | ICD-10-CM | POA: Diagnosis not present

## 2020-12-02 DIAGNOSIS — G9341 Metabolic encephalopathy: Secondary | ICD-10-CM | POA: Diagnosis present

## 2020-12-02 DIAGNOSIS — Z91130 Patient's unintentional underdosing of medication regimen due to age-related debility: Secondary | ICD-10-CM

## 2020-12-02 DIAGNOSIS — J189 Pneumonia, unspecified organism: Secondary | ICD-10-CM | POA: Diagnosis not present

## 2020-12-02 DIAGNOSIS — R079 Chest pain, unspecified: Secondary | ICD-10-CM | POA: Diagnosis not present

## 2020-12-02 DIAGNOSIS — T381X6A Underdosing of thyroid hormones and substitutes, initial encounter: Secondary | ICD-10-CM | POA: Diagnosis present

## 2020-12-02 DIAGNOSIS — R32 Unspecified urinary incontinence: Secondary | ICD-10-CM | POA: Diagnosis present

## 2020-12-02 LAB — CBC WITH DIFFERENTIAL/PLATELET
Abs Immature Granulocytes: 0.01 10*3/uL (ref 0.00–0.07)
Basophils Absolute: 0 10*3/uL (ref 0.0–0.1)
Basophils Relative: 1 %
Eosinophils Absolute: 0.1 10*3/uL (ref 0.0–0.5)
Eosinophils Relative: 3 %
HCT: 26.9 % — ABNORMAL LOW (ref 39.0–52.0)
Hemoglobin: 8.7 g/dL — ABNORMAL LOW (ref 13.0–17.0)
Immature Granulocytes: 0 %
Lymphocytes Relative: 16 %
Lymphs Abs: 0.7 10*3/uL (ref 0.7–4.0)
MCH: 31.4 pg (ref 26.0–34.0)
MCHC: 32.3 g/dL (ref 30.0–36.0)
MCV: 97.1 fL (ref 80.0–100.0)
Monocytes Absolute: 0.5 10*3/uL (ref 0.1–1.0)
Monocytes Relative: 12 %
Neutro Abs: 2.9 10*3/uL (ref 1.7–7.7)
Neutrophils Relative %: 68 %
Platelets: 183 10*3/uL (ref 150–400)
RBC: 2.77 MIL/uL — ABNORMAL LOW (ref 4.22–5.81)
RDW: 14.7 % (ref 11.5–15.5)
WBC: 4.3 10*3/uL (ref 4.0–10.5)
nRBC: 0 % (ref 0.0–0.2)

## 2020-12-02 LAB — COMPREHENSIVE METABOLIC PANEL
ALT: 16 U/L (ref 0–44)
AST: 29 U/L (ref 15–41)
Albumin: 2.8 g/dL — ABNORMAL LOW (ref 3.5–5.0)
Alkaline Phosphatase: 66 U/L (ref 38–126)
Anion gap: 16 — ABNORMAL HIGH (ref 5–15)
BUN: 85 mg/dL — ABNORMAL HIGH (ref 8–23)
CO2: 25 mmol/L (ref 22–32)
Calcium: 8.2 mg/dL — ABNORMAL LOW (ref 8.9–10.3)
Chloride: 101 mmol/L (ref 98–111)
Creatinine, Ser: 6.67 mg/dL — ABNORMAL HIGH (ref 0.61–1.24)
GFR, Estimated: 8 mL/min — ABNORMAL LOW (ref >60)
Glucose, Bld: 343 mg/dL — ABNORMAL HIGH (ref 70–99)
Potassium: 3.4 mmol/L — ABNORMAL LOW (ref 3.5–5.1)
Sodium: 142 mmol/L (ref 135–145)
Total Bilirubin: 0.9 mg/dL (ref 0.3–1.2)
Total Protein: 6.9 g/dL (ref 6.5–8.1)

## 2020-12-02 LAB — URINALYSIS, ROUTINE W REFLEX MICROSCOPIC
Bilirubin Urine: NEGATIVE
Glucose, UA: >=500 mg/dL — AB
Ketones, ur: 5 mg/dL — AB
Leukocytes,Ua: NEGATIVE
Nitrite: NEGATIVE
Protein, ur: >=300 mg/dL — AB
Specific Gravity, Urine: 1.013 (ref 1.005–1.030)
pH: 6 (ref 5.0–8.0)

## 2020-12-02 LAB — MRSA PCR SCREENING: MRSA by PCR: POSITIVE — AB

## 2020-12-02 LAB — BLOOD GAS, VENOUS
Acid-Base Excess: 0.5 mmol/L (ref 0.0–2.0)
Bicarbonate: 26.1 mmol/L (ref 20.0–28.0)
O2 Saturation: 97.4 %
Patient temperature: 98.6
pCO2, Ven: 51.1 mmHg (ref 44.0–60.0)
pH, Ven: 7.328 (ref 7.250–7.430)
pO2, Ven: 90.2 mmHg — ABNORMAL HIGH (ref 32.0–45.0)

## 2020-12-02 LAB — RESP PANEL BY RT-PCR (FLU A&B, COVID) ARPGX2
Influenza A by PCR: NEGATIVE
Influenza B by PCR: NEGATIVE
SARS Coronavirus 2 by RT PCR: NEGATIVE

## 2020-12-02 LAB — PROCALCITONIN: Procalcitonin: 0.32 ng/mL

## 2020-12-02 LAB — CBG MONITORING, ED: Glucose-Capillary: 271 mg/dL — ABNORMAL HIGH (ref 70–99)

## 2020-12-02 LAB — PROTIME-INR
INR: 1 (ref 0.8–1.2)
Prothrombin Time: 12.4 seconds (ref 11.4–15.2)

## 2020-12-02 LAB — TROPONIN I (HIGH SENSITIVITY)
Troponin I (High Sensitivity): 38 ng/L — ABNORMAL HIGH (ref <18)
Troponin I (High Sensitivity): 33 ng/L — ABNORMAL HIGH (ref <18)

## 2020-12-02 LAB — BRAIN NATRIURETIC PEPTIDE: B Natriuretic Peptide: 891.5 pg/mL — ABNORMAL HIGH (ref 0.0–100.0)

## 2020-12-02 LAB — MAGNESIUM: Magnesium: 2.6 mg/dL — ABNORMAL HIGH (ref 1.7–2.4)

## 2020-12-02 LAB — TSH: TSH: 14.363 u[IU]/mL — ABNORMAL HIGH (ref 0.350–4.500)

## 2020-12-02 LAB — LACTIC ACID, PLASMA
Lactic Acid, Venous: 1.9 mmol/L (ref 0.5–1.9)
Lactic Acid, Venous: 2 mmol/L (ref 0.5–1.9)

## 2020-12-02 LAB — APTT: aPTT: 30 seconds (ref 24–36)

## 2020-12-02 MED ORDER — FUROSEMIDE 10 MG/ML IJ SOLN
40.0000 mg | Freq: Once | INTRAMUSCULAR | Status: AC
  Administered 2020-12-02: 40 mg via INTRAVENOUS
  Filled 2020-12-02: qty 4

## 2020-12-02 MED ORDER — ONDANSETRON HCL 4 MG/2ML IJ SOLN: 4.0000 mg | Freq: Four times a day (QID) | INTRAMUSCULAR | Status: DC | PRN

## 2020-12-02 MED ORDER — ACETAMINOPHEN 325 MG PO TABS
650.0000 mg | ORAL_TABLET | Freq: Four times a day (QID) | ORAL | Status: DC | PRN
  Filled 2020-12-02: qty 2

## 2020-12-02 MED ORDER — SODIUM CHLORIDE 0.9 % IV SOLN
1.0000 g | INTRAVENOUS | Status: DC
  Administered 2020-12-03 – 2020-12-06 (×4): 1 g via INTRAVENOUS
  Filled 2020-12-02 (×4): qty 1

## 2020-12-02 MED ORDER — SODIUM CHLORIDE 0.9 % IV SOLN
2.0000 g | Freq: Once | INTRAVENOUS | Status: AC
  Administered 2020-12-02: 2 g via INTRAVENOUS
  Filled 2020-12-02: qty 2

## 2020-12-02 MED ORDER — VANCOMYCIN HCL IN DEXTROSE 1-5 GM/200ML-% IV SOLN
1000.0000 mg | Freq: Once | INTRAVENOUS | Status: DC
Start: 2020-12-02 — End: 2020-12-02

## 2020-12-02 MED ORDER — CHLORHEXIDINE GLUCONATE CLOTH 2 % EX PADS
6.0000 | MEDICATED_PAD | Freq: Every day | CUTANEOUS | Status: DC
  Administered 2020-12-03 – 2020-12-06 (×4): 6 via TOPICAL

## 2020-12-02 MED ORDER — ONDANSETRON HCL 4 MG PO TABS: 4.0000 mg | ORAL_TABLET | Freq: Four times a day (QID) | ORAL | Status: DC | PRN

## 2020-12-02 MED ORDER — LACTATED RINGERS IV SOLN: INTRAVENOUS | Status: DC

## 2020-12-02 MED ORDER — METRONIDAZOLE IN NACL 5-0.79 MG/ML-% IV SOLN
500.0000 mg | Freq: Once | INTRAVENOUS | Status: AC
  Administered 2020-12-02: 500 mg via INTRAVENOUS
  Filled 2020-12-02: qty 100

## 2020-12-02 MED ORDER — INSULIN ASPART 100 UNIT/ML ~~LOC~~ SOLN
0.0000 [IU] | Freq: Three times a day (TID) | SUBCUTANEOUS | Status: DC
  Filled 2020-12-02: qty 0.06

## 2020-12-02 MED ORDER — POTASSIUM CHLORIDE 10 MEQ/100ML IV SOLN
10.0000 meq | INTRAVENOUS | Status: AC
  Administered 2020-12-02 (×2): 10 meq via INTRAVENOUS
  Filled 2020-12-02 (×2): qty 100

## 2020-12-02 MED ORDER — LORAZEPAM 2 MG/ML IJ SOLN
1.0000 mg | Freq: Once | INTRAMUSCULAR | Status: AC
  Administered 2020-12-02: 1 mg via INTRAVENOUS
  Filled 2020-12-02: qty 1

## 2020-12-02 MED ORDER — LORAZEPAM 2 MG/ML IJ SOLN
0.5000 mg | Freq: Once | INTRAMUSCULAR | Status: AC
  Administered 2020-12-02: 0.5 mg via INTRAVENOUS
  Filled 2020-12-02: qty 1

## 2020-12-02 MED ORDER — VANCOMYCIN HCL 1500 MG/300ML IV SOLN
1500.0000 mg | Freq: Once | INTRAVENOUS | Status: AC
  Administered 2020-12-02: 1500 mg via INTRAVENOUS
  Filled 2020-12-02: qty 300

## 2020-12-02 MED ORDER — LEVOTHYROXINE SODIUM 100 MCG/5ML IV SOLN: 25.0000 ug | Freq: Every day | INTRAVENOUS | Status: DC

## 2020-12-02 MED ORDER — ACETAMINOPHEN 650 MG RE SUPP: 650.0000 mg | Freq: Four times a day (QID) | RECTAL | Status: DC | PRN

## 2020-12-02 MED ORDER — FUROSEMIDE 10 MG/ML IJ SOLN
40.0000 mg | Freq: Every day | INTRAMUSCULAR | Status: DC
  Administered 2020-12-03 – 2020-12-04 (×2): 40 mg via INTRAVENOUS
  Filled 2020-12-02 (×2): qty 4

## 2020-12-02 MED ORDER — LEVOTHYROXINE SODIUM 100 MCG/5ML IV SOLN
25.0000 ug | Freq: Every day | INTRAVENOUS | Status: DC
  Administered 2020-12-04: 25 ug via INTRAVENOUS
  Filled 2020-12-02 (×3): qty 5

## 2020-12-02 MED ORDER — MUPIROCIN 2 % EX OINT
1.0000 "application " | TOPICAL_OINTMENT | Freq: Two times a day (BID) | CUTANEOUS | Status: DC
  Administered 2020-12-03 – 2020-12-06 (×9): 1 via NASAL
  Filled 2020-12-02: qty 22

## 2020-12-02 NOTE — ED Notes (Signed)
Pt rectal temp 92.5. Bair hugger placed on pt at highest temp.

## 2020-12-02 NOTE — ED Triage Notes (Signed)
BIBA Per EMS:  Pt coming from home with resp distress Per EMS pt sats initially 90%RA  With labored breathing  Upon exertion pt O2 sats dropped to 84%- pt placed on 4L O2 Albuterol treatment given  Wheezing and rhonchi heard in lungs  Stage 5 renal disease; no dialysis  133/78 20 RR 100% 4L  77 HR

## 2020-12-02 NOTE — ED Notes (Signed)
Patient becoming more agitated, trying to get out of bed. [place], APP notified via Amion and RN requested medication to calm patient.

## 2020-12-02 NOTE — Progress Notes (Signed)
Pharmacy Antibiotic Note  [name] [name] is a 75 y.o. male with hx CKD5 presented to the ED on 12/02/2020 with AMS.   CXR on 2/20 showed findings with concern for PNA. Pharmacy has been consulted start cefepime for PNA.  - cefepime 2 gm x1 given in the ED at 1pm - scr 6.67 (crcl<10)   Plan: - cefepime 1 gm IV q24h - monitor renal function closely  ___________________________________ Temp (24hrs), Avg:93.5 F (34.2 C), Min:92.5 F (33.6 C), Max:95.8 F (35.4 C)  Recent Labs  Lab 11/25/20 1855 11/25/20 1910 11/25/20 2044 11/26/20 0026 11/26/20 1621 11/27/20 0255 11/28/20 0050 12/02/20 1227 12/02/20 1450  WBC  --   --   --  2.5* 5.3 5.7 6.2 4.3  --   CREATININE  --    < >  --  7.49* 7.38* 7.32* 6.79* 6.67*  --   LATICACIDVEN 2.0*  --  1.8  --   --   --   --  1.9 2.0*  VANCORANDOM  --   --   --   --   --   --  16  --   --    < > = values in this interval not displayed.    CrCl cannot be calculated (Unknown ideal weight.).    No Known Allergies   Thank you for allowing pharmacy to be a part of this patient's care.  Lynelle Doctor 12/02/2020 5:27 PM

## 2020-12-02 NOTE — ED Provider Notes (Signed)
[facility] DEPT Provider Note   CSN: [id number] Arrival date & time: 12/02/20  1145     History Chief Complaint  Patient presents with  . Respiratory Distress    [name] [name] is a 75 y.o. male.  HPI    75 year old male with a history of cerebral in infarction, diabetes, DKA, hypertension, hypoglycemia, retroperitoneal hematoma, CVA, who presents to the emergency department today for evaluation of generalized weakness and hypoxia.  Per EMS patient was hypoxic on their arrival at 90% on room air, with exertion he dropped to 84% was placed on 4 L.  He was given albuterol in route.  12:31 PM discussed case with the patient's sibling.  She states that since the patient has been home from the hospital he has been generally weak, fatigued and has not been wanting to get out of bed.  He had decreased p.o. intake yesterday however was up twice in the night and fell.  Neither of these falls were witnessed but they were concerned about the patient's weakness and falls so called EMS.  They further state that the thermostat went out last night so they placed a heating blanket on the patient.  Past Medical History:  Diagnosis Date  . 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 Closed comminuted intertrochanteric fracture of left femur ([place])   . Diabetes mellitus without complication ([place])   . Diabetic hyperosmolar non-ketotic state ([place]) 05/15/2016  . DKA (diabetic ketoacidoses) 05/09/2016  . Hypertension   . Hypoglycemia 11/29/2019  . Postoperative anemia due to acute blood loss 06/18/2016  . Retroperitoneal hematoma 06/18/2016  . Stroke ([place])   . Vitamin B 12 deficiency 06/18/2016    Patient Active Problem List   Diagnosis Date Noted  . Hypothermia 11/25/2020  . Aspiration, chronic pulmonary 10/19/2020  . Volume overload 10/19/2020  . Fluid overload 10/18/2020  . Prolonged QT interval 10/18/2020  . Palliative care  by specialist   . Acute encephalopathy 08/18/2020  . Aspiration pneumonia of right lower lobe ([place]) 08/18/2020  . AKI (acute kidney injury) ([place]) 08/15/2020  . Type 1 diabetes mellitus with hyperglycemia ([place]) 07/27/2020  . 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 Diabetes mellitus type I ([place]) 07/27/2020  . Thrombocytopenia ([place]) 04/23/2020  . Hypothyroidism 04/22/2020  . DKA (diabetic ketoacidoses) 04/14/2020  . PVD (peripheral vascular disease) ([place]) 02/02/2020  . Poor social situation 12/08/2019  . CKD (chronic kidney disease) stage 4, GFR 15-29 ml/min ([name]) 12/08/2019  . Cognitive decline 11/22/2019  . ETOH abuse 05/17/2019  . Chronic diastolic CHF (congestive heart failure) ([place]) 05/17/2019  . Uncontrolled type 2 diabetes mellitus with hyperglycemia, with long-term current use of insulin ([place]) 10/03/2018  . Underweight 11/18/2017  . Anemia 11/18/2017  . History of cerebrovascular accident (CVA) in adulthood 07/25/2017  . Depression with anxiety 07/25/2017  . Left ventricular diastolic dysfunction 40/07/2724  . Dementia ([place]) 07/25/2017  . Brittle diabetes mellitus ([place]) 06/28/2017  . Hyperlipidemia associated with type 2 diabetes mellitus ([place]) 06/27/2017  . Hypertension associated with diabetes ([place]) 06/27/2017  . Acute metabolic encephalopathy 36/64/4034  . Weakness 11/16/2016  . CKD (chronic kidney disease), stage V ([place])   . Noncompliance with medication regimen 08/17/2016  . History of DVT (deep vein thrombosis) 07/06/2016  . Vitamin B 12 deficiency 06/18/2016  . Hyperglycemia 05/15/2016  . Hyponatremia 05/15/2016  . Anxiety 05/15/2016  . Narcotic dependency, continuous ([place]) 11/02/2015  . Uncontrolled  type 2 diabetes mellitus with diabetic nephropathy, with long-term current use of insulin ([place]) 08/12/2015  . Anemia of chronic disease 02/02/2013    Past Surgical History:  Procedure Laterality Date  . HIP ARTHROPLASTY Right  02/05/2013   Procedure: ARTHROPLASTY BIPOLAR HIP;  Surgeon: [name], MD;  Location: [facility];  Service: Orthopedics;  Laterality: Right;  . INTRAMEDULLARY (IM) NAIL INTERTROCHANTERIC Left 06/16/2016   Procedure: INTRAMEDULLARY (IM) NAIL INTERTROCHANTRIC;  Surgeon: Rod Can, MD;  Location: [facility];  Service: Orthopedics;  Laterality: Left;       Family History  Problem Relation Age of Onset  . Alzheimer's disease Mother   . Hypertension Mother   . Hyperlipidemia Father   . Hypertension Father   . Healthy Maternal Grandmother   . Pneumonia Maternal Grandfather     Social History   Tobacco Use  . Smoking status: Never Smoker  . Smokeless tobacco: Never Used  Vaping Use  . Vaping Use: Never used  Substance Use Topics  . Alcohol use: No  . Drug use: No    Home Medications Prior to Admission medications   Medication Sig Start Date End Date Taking? Authorizing Provider  ACCU-CHEK GUIDE test strip USE AS DIRECTED 06/12/20   Azzie Glatter, [name]  acetaminophen (TYLENOL) 325 MG tablet Take 2 tablets (650 mg total) by mouth every 6 (six) hours as needed for up to 30 doses for mild pain or moderate pain. 07/06/20   Wyvonnia Dusky, MD  albuterol (PROVENTIL) (2.5 MG/3ML) 0.083% nebulizer solution Take 2.5 mg by nebulization every 6 (six) hours as needed for shortness of breath or wheezing. 11/23/20   [provider]  amLODipine (NORVASC) 10 MG tablet Take 1 tablet (10 mg total) by mouth daily. 06/26/20   Libby Maw, MD  atorvastatin (LIPITOR) 40 MG tablet Take 40 mg by mouth daily. 11/05/20   [provider]  Blood Glucose Monitoring Suppl (ACCU-CHEK GUIDE) w/Device KIT 1 Device by Does not apply route QID. 11/02/19   Azzie Glatter, [name]  calcium acetate (PHOSLO) 667 MG capsule Take 2 capsules (1,334 mg total) by mouth 3 (three) times daily with meals. 10/22/20   [name], [name], DO  docusate sodium (COLACE) 100 MG capsule Take 1 capsule (100 mg total) by  mouth 2 (two) times daily. Patient taking differently: Take 100 mg by mouth daily as needed for mild constipation. 10/22/20   [name], [name], DO  feeding supplement (ENSURE ENLIVE / ENSURE PLUS) LIQD Take 237 mLs by mouth 2 (two) times daily between meals. Patient not taking: Reported on 11/26/2020 10/23/20   [name], [name], DO  furosemide (LASIX) 40 MG tablet Take 40 mg by mouth 2 (two) times daily. 07/30/20   [provider]  glucose blood (ACCU-CHEK GUIDE) test strip Use as instructed 11/03/18   Azzie Glatter, [name]  glucose blood (TRUE METRIX BLOOD GLUCOSE TEST) test strip CHECK BLOOD SUGAR UP TO 4 TIMES A DAY. Patient taking differently: 1 each by Other route See admin instructions. CHECK BLOOD SUGAR UP TO 2 TIMES A DAY. 05/25/18   Azzie Glatter, [name]  hydrALAZINE (APRESOLINE) 100 MG tablet TAKE 1 TABLET BY MOUTH 3 TIMES A DAY. Patient taking differently: Take 100 mg by mouth 3 (three) times daily. 07/30/20   Libby Maw, MD  hyoscyamine (LEVSIN SL) 0.125 MG SL tablet Place 1 tablet (0.125 mg total) under the tongue every 4 (four) hours as needed (excess oral secretions). 10/22/20   [name], [name], DO  Insulin Glargine (BASAGLAR KWIKPEN) 100 UNIT/ML Inject 5 Units into the skin at bedtime. 08/20/20   [name], DO  insulin lispro (HUMALOG KWIKPEN) 100 UNIT/ML KwikPen Inject 0-10 Units into the skin 3 (three) times daily as needed (blood sugar). Max daily 30 units 08/20/20   [name], DO  Insulin Pen Needle 32G X 4 MM MISC 1 Device by Does not apply route in the morning, at noon, in the evening, and at bedtime. 01/03/20   [name], [name], MD  Insulin Syringe-Needle U-100 (BD INSULIN SYRINGE U/F) 31G X 5/16" 0.3 ML MISC Inject 1 each as directed 2 (two) times daily. as directed 11/02/19   Azzie Glatter, [name]  levothyroxine (SYNTHROID) 50 MCG tablet Take 1 tablet (50 mcg total) by mouth daily before breakfast. 11/29/20   Oswald Hillock, MD  LORazepam (ATIVAN) 0.5 MG  tablet Take 1 tablet (0.5 mg total) by mouth every 4 (four) hours as needed for anxiety. May crush, mix with water and give sublingually if needed. 10/22/20   [name], [name], DO  oxyCODONE (OXY IR/ROXICODONE) 5 MG immediate release tablet Take 1 tablet (5 mg total) by mouth every 4 (four) hours as needed for severe pain. May crush, mix with water and give sublingually if needed. 10/22/20   [name], [name], DO  prochlorperazine (COMPAZINE) 10 MG tablet Take 1 tablet (10 mg total) by mouth every 4 (four) hours as needed for nausea or vomiting. May crush, mix with water and give sublingually. 10/22/20   [name], [name], DO  sodium bicarbonate 650 MG tablet Take 1 tablet (650 mg total) by mouth 2 (two) times daily. 04/19/20   [name], MD  vitamin B-12 1000 MCG tablet Take 1 tablet (1,000 mcg total) by mouth daily. 12/05/19   [name], MD    Allergies    Patient has no known allergies.  Review of Systems   Review of Systems  Constitutional: Negative for fever.  HENT: Negative for ear pain and sore throat.   Eyes: Negative for visual disturbance.  Respiratory: Positive for cough and shortness of breath.   Cardiovascular: Positive for leg swelling. Negative for chest pain.  Gastrointestinal: Negative for abdominal pain, diarrhea, nausea and vomiting.  Genitourinary: Negative for dysuria and hematuria.  Musculoskeletal: Negative for back pain.  Skin: Negative for rash.  Neurological: Negative for syncope.  All other systems reviewed and are negative.   Physical Exam Updated Vital Signs BP 122/67   Pulse 65   Temp (!) 92.7 F (33.7 C) (Rectal)   Resp (!) 23   SpO2 94%   Physical Exam Vitals and nursing note reviewed.  Constitutional:      Appearance: He is well-developed and well-nourished.  HENT:     Head: Normocephalic and atraumatic.  Eyes:     Conjunctiva/sclera: Conjunctivae normal.  Cardiovascular:     Rate and Rhythm: Normal rate and regular rhythm.     Heart sounds: Normal  heart sounds. No murmur heard.   Pulmonary:     Effort: Pulmonary effort is normal.     Comments: Coarse breath sounds throughout Abdominal:     General: Bowel sounds are normal.     Palpations: Abdomen is soft.     Tenderness: There is no abdominal tenderness. There is no guarding or rebound.  Musculoskeletal:     Cervical back: Neck supple.     Right lower leg: Edema present.     Left lower leg: Edema present.  Skin:    General:  Skin is warm and dry.  Neurological:     Mental Status: He is alert.     Comments: Alert, answers questions appropriately and moves all extremities  Psychiatric:        Mood and Affect: Mood and affect normal.     ED Results / Procedures / Treatments   Labs (all labs ordered are listed, but only abnormal results are displayed) Labs Reviewed  COMPREHENSIVE METABOLIC PANEL - Abnormal; Notable for the following components:      Result Value   Potassium 3.4 (*)    Glucose, Bld 343 (*)    BUN 85 (*)    Creatinine, Ser 6.67 (*)    Calcium 8.2 (*)    Albumin 2.8 (*)    GFR, Estimated 8 (*)    Anion gap 16 (*)    All other components within normal limits  CBC WITH DIFFERENTIAL/PLATELET - Abnormal; Notable for the following components:   RBC 2.77 (*)    Hemoglobin 8.7 (*)    HCT 26.9 (*)    All other components within normal limits  TSH - Abnormal; Notable for the following components:   TSH 14.363 (*)    All other components within normal limits  BRAIN NATRIURETIC PEPTIDE - Abnormal; Notable for the following components:   B Natriuretic Peptide 891.5 (*)    All other components within normal limits  BLOOD GAS, VENOUS - Abnormal; Notable for the following components:   pO2, Ven 90.2 (*)    All other components within normal limits  TROPONIN I (HIGH SENSITIVITY) - Abnormal; Notable for the following components:   Troponin I (High Sensitivity) 38 (*)    All other components within normal limits  RESP PANEL BY RT-PCR (FLU A&B, COVID) ARPGX2   CULTURE, BLOOD (ROUTINE X 2)  CULTURE, BLOOD (ROUTINE X 2)  URINE CULTURE  LACTIC ACID, PLASMA  PROTIME-INR  APTT  LACTIC ACID, PLASMA  URINALYSIS, ROUTINE W REFLEX MICROSCOPIC  TROPONIN I (HIGH SENSITIVITY)    EKG None  Radiology CT Head Wo Contrast  Result Date: 12/02/2020 CLINICAL DATA:  75 year old male with head and neck injury following fall. Altered mental status. EXAM: CT HEAD WITHOUT CONTRAST CT CERVICAL SPINE WITHOUT CONTRAST TECHNIQUE: Multidetector CT imaging of the head and cervical spine was performed following the standard protocol without intravenous contrast. Multiplanar CT image reconstructions of the cervical spine were also generated. COMPARISON:  11/25/2020 CT/MR and prior studies. FINDINGS: CT HEAD FINDINGS Brain: No evidence of acute infarction, hemorrhage, hydrocephalus, extra-axial collection or new mass lesion/mass effect. 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 Atrophy and chronic small-vessel white matter ischemic changes are again noted. Vascular: Carotid atherosclerotic calcifications are noted. Skull: Normal. Negative for fracture or focal lesion. Sinuses/Orbits: No acute finding. Other: None. CT CERVICAL SPINE FINDINGS Alignment: Loss of the normal cervical lordosis without acute subluxation. Skull base and vertebrae: No acute fracture. No primary bone lesion or focal pathologic process. Soft tissues and spinal canal: No prevertebral fluid or swelling. No visible canal hematoma. Disc levels: Moderate to severe multilevel degenerative disc disease and spondylosis from C3-C7 again noted and again contributing to central spinal and bony foraminal narrowing. Upper chest: Bilateral pleural effusions are noted, at least moderate sized. Ground-glass opacities within the lung apices are noted. Other: None IMPRESSION: 1. No evidence of acute intracranial abnormality. Atrophy and chronic  small-vessel white matter ischemic changes. 2. No static evidence of acute injury to  the cervical spine. 3. Bilateral pleural effusions, at least moderate sized, and ground-glass opacities within the lung apices. Electronically Signed   By: Margarette Canada M.D.   On: 12/02/2020 14:08   CT Cervical Spine Wo Contrast  Result Date: 12/02/2020 CLINICAL DATA:  75 year old male with head and neck injury following fall. Altered mental status. EXAM: CT HEAD WITHOUT CONTRAST CT CERVICAL SPINE WITHOUT CONTRAST TECHNIQUE: Multidetector CT imaging of the head and cervical spine was performed following the standard protocol without intravenous contrast. Multiplanar CT image reconstructions of the cervical spine were also generated. COMPARISON:  11/25/2020 CT/MR and prior studies. FINDINGS: CT HEAD FINDINGS Brain: No evidence of acute infarction, hemorrhage, hydrocephalus, extra-axial collection or new mass lesion/mass effect. 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 Atrophy and chronic small-vessel white matter ischemic changes are again noted. Vascular: Carotid atherosclerotic calcifications are noted. Skull: Normal. Negative for fracture or focal lesion. Sinuses/Orbits: No acute finding. Other: None. CT CERVICAL SPINE FINDINGS Alignment: Loss of the normal cervical lordosis without acute subluxation. Skull base and vertebrae: No acute fracture. No primary bone lesion or focal pathologic process. Soft tissues and spinal canal: No prevertebral fluid or swelling. No visible canal hematoma. Disc levels: Moderate to severe multilevel degenerative disc disease and spondylosis from C3-C7 again noted and again contributing to central spinal and bony foraminal narrowing. Upper chest: Bilateral pleural effusions are noted, at least moderate sized. Ground-glass opacities within the lung apices are noted. Other: None IMPRESSION: 1. No evidence of acute  intracranial abnormality. Atrophy and chronic small-vessel white matter ischemic changes. 2. No static evidence of acute injury to the cervical spine. 3. Bilateral pleural effusions, at least moderate sized, and ground-glass opacities within the lung apices. Electronically Signed   By: Margarette Canada M.D.   On: 12/02/2020 14:08   DG Chest Port 1 View  Result Date: 12/02/2020 CLINICAL DATA:  Questionable sepsis, history diabetes mellitus, hypertension EXAM: PORTABLE CHEST 1 VIEW COMPARISON:  Portable exam 1233 hours compared to 11/27/2020 FINDINGS: Enlargement of cardiac silhouette with slight vascular congestion. Mediastinal contours normal. BILATERAL pleural effusions and basilar atelectasis. Mild perihilar to basilar infiltrates question pulmonary edema versus pneumonia. No pneumothorax or acute osseous findings. Calcified granuloma lower RIGHT lung. IMPRESSION: BILATERAL pleural effusions and basilar atelectasis. Persistent pulmonary infiltrates question pulmonary edema versus pneumonia. Electronically Signed   By: Lavonia Dana M.D.   On: 12/02/2020 12:40   DG Hips Bilat W or Wo Pelvis 3-4 Views  Result Date: 12/02/2020 CLINICAL DATA:  Acute hip and pelvic pain following fall. Initial encounter. EXAM: DG HIP (WITH OR WITHOUT PELVIS) 3-4V BILAT COMPARISON:  10/01/2016 and prior studies FINDINGS: No acute fracture or dislocation identified. RIGHT hip hemiarthroplasty and remote fracture/ORIF of the proximal LEFT femur again noted. No complicating hardware features are identified. No focal bony lesions are noted. IMPRESSION: No acute abnormality. Electronically Signed   By: Margarette Canada M.D.   On: 12/02/2020 13:56    Procedures Procedures    CRITICAL CARE Performed by: Rodney Booze   Total critical care time: 44 minutes  Critical care time was exclusive of separately billable procedures and treating other patients.  Critical care was necessary to treat or prevent imminent or life-threatening  deterioration.  Critical care was time spent personally by me on the following activities: development of treatment plan with patient and/or surrogate as well as nursing, discussions with consultants, evaluation of patient's response to treatment,  examination of patient, obtaining history from patient or surrogate, ordering and performing treatments and interventions, ordering and review of laboratory studies, ordering and review of radiographic studies, pulse oximetry and re-evaluation of patient's condition.   Medications Ordered in ED Medications  lactated ringers infusion ( Intravenous New Bag/Given 12/02/20 1342)  vancomycin (VANCOREADY) IVPB 1500 mg/300 mL (1,500 mg Intravenous New Bag/Given 12/02/20 1343)  ceFEPIme (MAXIPIME) 2 g in sodium chloride 0.9 % 100 mL IVPB (0 g Intravenous Stopped 12/02/20 1329)  metroNIDAZOLE (FLAGYL) IVPB 500 mg (500 mg Intravenous New Bag/Given 12/02/20 1343)  furosemide (LASIX) injection 40 mg (40 mg Intravenous Given 12/02/20 1442)    ED Course  I have reviewed the triage vital signs and the nursing notes.  Pertinent labs & imaging results that were available during my care of the patient were reviewed by me and considered in my medical decision making (see chart for details).    MDM Rules/Calculators/A&P                          75 year old male presenting the emergency department today for evaluation of generalized weakness and hypoxia. Had two falls last night. Also heater went out at the patients house last night. He is hypothermic on arrival.   Reviewed/interpreted labs CBC w/o leukocytosis, anemia present but near pts baseline CMP with elevated BS, normal bicarb, elevated bun/cr which is baseline for pt, normal lfts coags wnl Trop marginally elevated which appears chronic covid neg UA pending on admission vbg pending on admission Lactic acid neg making sepsis less likely TSH significantly elevated, but improving since one week ago BNP pending  on admission  EKG   CXR - BILATERAL pleural effusions and basilar atelectasis. Persistent pulmonary infiltrates question pulmonary edema versus pneumonia.  CT head/cervical spine -  1. No evidence of acute intracranial abnormality. Atrophy and chronic small-vessel white matter ischemic changes. 2. No static evidence of acute injury to the cervical spine. 3. Bilateral pleural effusions, at least moderate sized, and ground-glass opacities within the lung apices Pelvis xray - No acute abnormality.  Pt with hypoxia likely from fluid overload. He has possible pneumonia as well and was tx with abx. Found to be hypothermic though I suspect this is environmentally related. Will admit for diuresis  2:44 PM CONSULT with Dr. Marylyn Ishihara who accepts patient for admission   Final Clinical Impression(s) / ED Diagnoses Final diagnoses:  Hypervolemia, unspecified hypervolemia type  Hypothermia, initial encounter    Rx / DC Orders ED Discharge Orders    None       Bishop Dublin 12/02/20 1456    Quintella Reichert, MD 12/04/20 [phone]

## 2020-12-02 NOTE — Progress Notes (Signed)
Pt refuses to stay in bed. Arrived to room after alarm went off and found pt with both legs over the side rail.  Repeated attempts to get out of bed despite reorientation.  Pt is combative.  Green mitts are on but pt is attempting to pull them off.  Pt yelling out of room.  Will contact [facility] for suggestions.

## 2020-12-02 NOTE — H&P (Signed)
History and Physical    [name] [name] DXA:[id number] DOB: [birth year] DOA: 12/02/2020  PCP: Libby Maw, MD  Patient coming from: Home  Chief Complaint: altered mental status  HPI: [name] [name] is a 75 y.o. male with medical history significant of hypothyroidism, multiple CVAs, DM2. Presenting with altered mental status. History per sister as patient is confused. His sister reports that last night, he fell out of bed around 3am. No head injury noted. EMS came out to assist him to his bed. He fell again around 5 am. EMS came back and assisted him to bed again. Family gave him his medications and breakfast. He seemed confused. His speech was slurred and he seemed off. Family noted that he had a lot of wheeze and cough. Family called EMS again to transport to the ED.    Of note, he had a similar presentation a few days ago and was admitted for hypothermia, hypothyroidism, and PNA.   ED Course: He was found to be hypothermic and hypoxic. He was started on broad spec abx and placed on warming blanket. CXR show possible PNA vs pulm edema. BNP was found to be elevated. He was given Lasix IV 56m x 1. TRH was called for admission.   Review of Systems:  Unable to obtain d/t mentation.  PMHx Past Medical History:  Diagnosis Date  . 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 Closed comminuted intertrochanteric fracture of left femur ([place]   . Diabetes mellitus without complication ([place]   . Diabetic hyperosmolar non-ketotic state ([place] 05/15/2016  . DKA (diabetic ketoacidoses) 05/09/2016  . Hypertension   . Hypoglycemia 11/29/2019  . Postoperative anemia due to acute blood loss 06/18/2016  . Retroperitoneal hematoma 06/18/2016  . Stroke ([place]   . Vitamin B 12 deficiency 06/18/2016    PSHx Past Surgical History:  Procedure Laterality Date  . HIP ARTHROPLASTY Right 02/05/2013   Procedure: ARTHROPLASTY BIPOLAR HIP;  Surgeon: RTobi Bastos MD;   Location: [facility];  Service: Orthopedics;  Laterality: Right;  . INTRAMEDULLARY (IM) NAIL INTERTROCHANTERIC Left 06/16/2016   Procedure: INTRAMEDULLARY (IM) NAIL INTERTROCHANTRIC;  Surgeon: BRod Can MD;  [place]  Service: Orthopedics;  Laterality: Left;    SocHx  reports that he has never smoked. He has never used smokeless tobacco. He reports that he does not drink alcohol and does not use drugs.  No Known Allergies  FamHx Family History  Problem Relation Age of Onset  . Alzheimer's disease Mother   . Hypertension Mother   . Hyperlipidemia Father   . Hypertension Father   . Healthy Maternal Grandmother   . Pneumonia Maternal Grandfather     Prior to Admission medications   Medication Sig Start Date End Date Taking? Authorizing Provider  ACCU-CHEK GUIDE test strip USE AS DIRECTED 06/12/20   SAzzie Glatter [name]  acetaminophen (TYLENOL) 325 MG tablet Take 2 tablets (650 mg total) by mouth every 6 (six) hours as needed for up to 30 doses for mild pain or moderate pain. 07/06/20   TWyvonnia Dusky MD  albuterol (PROVENTIL) (2.5 MG/3ML) 0.083% nebulizer solution Take 2.5 mg by nebulization every 6 (six) hours as needed for shortness of breath or wheezing. 11/23/20   [provider]  amLODipine (NORVASC) 10 MG tablet Take 1 tablet (10 mg total) by mouth daily. 06/26/20   KLibby Maw MD  atorvastatin (LIPITOR) 40 MG tablet Take 40 mg by mouth daily. 11/05/20   [provider]  Blood Glucose Monitoring Suppl (ACCU-CHEK GUIDE) w/Device KIT 1 Device by Does not apply route QID. 11/02/19   Azzie Glatter, [name]  calcium acetate (PHOSLO) 667 MG capsule Take 2 capsules (1,334 mg total) by mouth 3 (three) times daily with meals. 10/22/20   [name], [name], DO  docusate sodium (COLACE) 100 MG capsule Take 1 capsule (100 mg total) by mouth 2 (two) times daily. Patient taking differently: Take 100 mg by mouth daily as needed for mild constipation. 10/22/20   [name],  [name], DO  feeding supplement (ENSURE ENLIVE / ENSURE PLUS) LIQD Take 237 mLs by mouth 2 (two) times daily between meals. Patient not taking: Reported on 11/26/2020 10/23/20   [name], [name], DO  furosemide (LASIX) 40 MG tablet Take 40 mg by mouth 2 (two) times daily. 07/30/20   [provider]  glucose blood (ACCU-CHEK GUIDE) test strip Use as instructed 11/03/18   Azzie Glatter, [name]  glucose blood (TRUE METRIX BLOOD GLUCOSE TEST) test strip CHECK BLOOD SUGAR UP TO 4 TIMES A DAY. Patient taking differently: 1 each by Other route See admin instructions. CHECK BLOOD SUGAR UP TO 2 TIMES A DAY. 05/25/18   Azzie Glatter, [name]  hydrALAZINE (APRESOLINE) 100 MG tablet TAKE 1 TABLET BY MOUTH 3 TIMES A DAY. Patient taking differently: Take 100 mg by mouth 3 (three) times daily. 07/30/20   Libby Maw, MD  hyoscyamine (LEVSIN SL) 0.125 MG SL tablet Place 1 tablet (0.125 mg total) under the tongue every 4 (four) hours as needed (excess oral secretions). 10/22/20   [name], [name], DO  Insulin Glargine (BASAGLAR KWIKPEN) 100 UNIT/ML Inject 5 Units into the skin at bedtime. 08/20/20   [name], DO  insulin lispro (HUMALOG KWIKPEN) 100 UNIT/ML KwikPen Inject 0-10 Units into the skin 3 (three) times daily as needed (blood sugar). Max daily 30 units 08/20/20   [name], DO  Insulin Pen Needle 32G X 4 MM MISC 1 Device by Does not apply route in the morning, at noon, in the evening, and at bedtime. 01/03/20   [name], [name], MD  Insulin Syringe-Needle U-100 (BD INSULIN SYRINGE U/F) 31G X 5/16" 0.3 ML MISC Inject 1 each as directed 2 (two) times daily. as directed 11/02/19   Azzie Glatter, [name]  levothyroxine (SYNTHROID) 50 MCG tablet Take 1 tablet (50 mcg total) by mouth daily before breakfast. 11/29/20   Oswald Hillock, MD  LORazepam (ATIVAN) 0.5 MG tablet Take 1 tablet (0.5 mg total) by mouth every 4 (four) hours as needed for anxiety. May crush, mix with water and give  sublingually if needed. 10/22/20   [name], [name], DO  oxyCODONE (OXY IR/ROXICODONE) 5 MG immediate release tablet Take 1 tablet (5 mg total) by mouth every 4 (four) hours as needed for severe pain. May crush, mix with water and give sublingually if needed. 10/22/20   [name], [name], DO  prochlorperazine (COMPAZINE) 10 MG tablet Take 1 tablet (10 mg total) by mouth every 4 (four) hours as needed for nausea or vomiting. May crush, mix with water and give sublingually. 10/22/20   [name], [name], DO  sodium bicarbonate 650 MG tablet Take 1 tablet (650 mg total) by mouth 2 (two) times daily. 04/19/20   [name], MD  vitamin B-12 1000 MCG tablet Take 1 tablet (1,000 mcg total) by mouth daily. 12/05/19   [name], MD    Physical Exam: Vitals:   12/02/20 1400 12/02/20 1401 12/02/20 1430 12/02/20 1500  BP: 110/67  122/67 134/83  Pulse: 63  65 77  Resp: (!) 22  (!) 23 (!) 23  Temp:  (!) 92.7 F (33.7 C) (!) 93.8 F (34.3 C)   TempSrc:  Rectal Rectal   SpO2: 96%  94% 93%    General: 75 y.o. ill appearing male resting in bed. Eyes: PERRL, normal sclera ENMT: Nares patent w/o discharge, orophaynx clear, dentition poor, ears w/o discharge/lesions/ulcers Neck: Supple, trachea midline Cardiovascular: RRR, +S1, S2, no m/g/r, equal pulses throughout Respiratory: decreased at bases, course, normal WOB on 3L [place] GI: BS+, NDNT, no masses noted, no organomegaly noted MSK: No c/c; b/l LE edema Neuro: A&O x 3, no focal deficits, but he is lethargic Psyc: cooperative, but sluggish  Labs on Admission: I have personally reviewed following labs and imaging studies  CBC: Recent Labs  Lab 11/25/20 1807 11/25/20 1910 11/26/20 0026 11/26/20 1621 11/27/20 0255 11/27/20 1812 11/28/20 0050 12/02/20 1227  WBC 3.9*  --  2.5* 5.3 5.7  --  6.2 4.3  NEUTROABS 2.4  --   --   --   --   --   --  2.9  HGB 7.0*   < > 9.8* 7.0* 7.2* 9.2* 9.1* 8.7*  HCT 23.0*   < > 31.7* 20.8* 22.1* 28.5* 27.6* 26.9*  MCV 97.0  --   96.6 93.7 94.0  --  92.6 97.1  PLT 150  --  128* 151 158  --  149* 183   < > = values in this interval not displayed.   Basic Metabolic Panel: Recent Labs  Lab 11/25/20 1807 11/25/20 1910 11/26/20 0026 11/26/20 1621 11/27/20 0255 11/28/20 0050 12/02/20 1227  NA 145 145  145  --  145 141 142 142  K 3.2* 3.3*  3.3*  --  3.5 3.3* 3.8 3.4*  CL 104 103  --  105 102 105 101  CO2 27  --   --  _0 GLUCOSE 274* 251*  --  230* 376* 117* 343*  BUN 80* 81*  --  79* 81* 81* 85*  CREATININE 7.35* 7.30* 7.49* 7.38* 7.32* 6.79* 6.67*  CALCIUM 7.1*  --   --  7.2* 7.3* 7.8* 8.2*   GFR: CrCl cannot be calculated (Unknown ideal weight.). Liver Function Tests: Recent Labs  Lab 11/25/20 1807 12/02/20 1227  AST 28 29  ALT 14 16  ALKPHOS 61 66  BILITOT 0.5 0.9  PROT 5.8* 6.9  ALBUMIN 2.5* 2.8*   No results for input(s): LIPASE, AMYLASE in the last 168 hours. Recent Labs  Lab 11/25/20 1855  AMMONIA 16   Coagulation Profile: Recent Labs  Lab 11/25/20 1855 12/02/20 1227  INR 1.3* 1.0   Cardiac Enzymes: No results for input(s): CKTOTAL, CKMB, CKMBINDEX, TROPONINI in the last 168 hours. BNP (last 3 results) No results for input(s): PROBNP in the last 8760 hours. HbA1C: No results for input(s): HGBA1C in the last 72 hours. CBG: Recent Labs  Lab 11/27/20 1635 11/27/20 2201 11/28/20 0649 11/28/20 1130 11/28/20 1615  GLUCAP 223* 151* 70 171* 212*   Lipid Profile: No results for input(s): CHOL, HDL, LDLCALC, TRIG, CHOLHDL, LDLDIRECT in the last 72 hours. Thyroid Function Tests: Recent Labs    12/02/20 1228  TSH 14.363*   Anemia Panel: No results for input(s): VITAMINB12, FOLATE, FERRITIN, TIBC, IRON, RETICCTPCT in the last 72 hours. Urine analysis:    Component Value Date/Time   COLORURINE YELLOW 11/25/2020 2109   APPEARANCEUR CLEAR 11/25/2020 2109  LABSPEC 1.020 11/25/2020 2109   PHURINE 8.0 11/25/2020 2109   GLUCOSEU >=500 (A) 11/25/2020 2109   GLUCOSEU  >=1000 (A) 12/08/2019 1139   HGBUR NEGATIVE 11/25/2020 2109   BILIRUBINUR NEGATIVE 11/25/2020 2109   BILIRUBINUR Negative 11/09/2019 0910   KETONESUR NEGATIVE 11/25/2020 2109   PROTEINUR >300 (A) 11/25/2020 2109   UROBILINOGEN 0.2 12/08/2019 1139   NITRITE NEGATIVE 11/25/2020 2109   LEUKOCYTESUR NEGATIVE 11/25/2020 2109    Radiological Exams on Admission: CT Head Wo Contrast  Result Date: 12/02/2020 CLINICAL DATA:  75 year old male with head and neck injury following fall. Altered mental status. EXAM: CT HEAD WITHOUT CONTRAST CT CERVICAL SPINE WITHOUT CONTRAST TECHNIQUE: Multidetector CT imaging of the head and cervical spine was performed following the standard protocol without intravenous contrast. Multiplanar CT image reconstructions of the cervical spine were also generated. COMPARISON:  11/25/2020 CT/MR and prior studies. FINDINGS: CT HEAD FINDINGS Brain: No evidence of acute infarction, hemorrhage, hydrocephalus, extra-axial collection or new mass lesion/mass effect. 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 Atrophy and chronic small-vessel white matter ischemic changes are again noted. Vascular: Carotid atherosclerotic calcifications are noted. Skull: Normal. Negative for fracture or focal lesion. Sinuses/Orbits: No acute finding. Other: None. CT CERVICAL SPINE FINDINGS Alignment: Loss of the normal cervical lordosis without acute subluxation. Skull base and vertebrae: No acute fracture. No primary bone lesion or focal pathologic process. Soft tissues and spinal canal: No prevertebral fluid or swelling. No visible canal hematoma. Disc levels: Moderate to severe multilevel degenerative disc disease and spondylosis from C3-C7 again noted and again contributing to central spinal and bony foraminal narrowing. Upper chest: Bilateral pleural effusions are noted, at least moderate sized. Ground-glass opacities within the lung  apices are noted. Other: None IMPRESSION: 1. No evidence of acute intracranial abnormality. Atrophy and chronic small-vessel white matter ischemic changes. 2. No static evidence of acute injury to the cervical spine. 3. Bilateral pleural effusions, at least moderate sized, and ground-glass opacities within the lung apices. Electronically Signed   By: Margarette Canada M.D.   On: 12/02/2020 14:08   CT Cervical Spine Wo Contrast  Result Date: 12/02/2020 CLINICAL DATA:  75 year old male with head and neck injury following fall. Altered mental status. EXAM: CT HEAD WITHOUT CONTRAST CT CERVICAL SPINE WITHOUT CONTRAST TECHNIQUE: Multidetector CT imaging of the head and cervical spine was performed following the standard protocol without intravenous contrast. Multiplanar CT image reconstructions of the cervical spine were also generated. COMPARISON:  11/25/2020 CT/MR and prior studies. FINDINGS: CT HEAD FINDINGS Brain: No evidence of acute infarction, hemorrhage, hydrocephalus, extra-axial collection or new mass lesion/mass effect. 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 Atrophy and chronic small-vessel white matter ischemic changes are again noted. Vascular: Carotid atherosclerotic calcifications are noted. Skull: Normal. Negative for fracture or focal lesion. Sinuses/Orbits: No acute finding. Other: None. CT CERVICAL SPINE FINDINGS Alignment: Loss of the normal cervical lordosis without acute subluxation. Skull base and vertebrae: No acute fracture. No primary bone lesion or focal pathologic process. Soft tissues and spinal canal: No prevertebral fluid or swelling. No visible canal hematoma. Disc levels: Moderate to severe multilevel degenerative disc disease and spondylosis from C3-C7 again noted and again contributing to central spinal and bony foraminal narrowing. Upper chest: Bilateral pleural effusions are noted, at least moderate sized.  Ground-glass opacities within the lung apices are noted. Other: None  IMPRESSION: 1. No evidence of acute intracranial abnormality. Atrophy and chronic small-vessel white matter ischemic changes. 2. No static evidence of acute injury to the cervical spine. 3. Bilateral pleural effusions, at least moderate sized, and ground-glass opacities within the lung apices. Electronically Signed   By: Margarette Canada M.D.   On: 12/02/2020 14:08   DG Chest Port 1 View  Result Date: 12/02/2020 CLINICAL DATA:  Questionable sepsis, history diabetes mellitus, hypertension EXAM: PORTABLE CHEST 1 VIEW COMPARISON:  Portable exam 1233 hours compared to 11/27/2020 FINDINGS: Enlargement of cardiac silhouette with slight vascular congestion. Mediastinal contours normal. BILATERAL pleural effusions and basilar atelectasis. Mild perihilar to basilar infiltrates question pulmonary edema versus pneumonia. No pneumothorax or acute osseous findings. Calcified granuloma lower RIGHT lung. IMPRESSION: BILATERAL pleural effusions and basilar atelectasis. Persistent pulmonary infiltrates question pulmonary edema versus pneumonia. Electronically Signed   By: Lavonia Dana M.D.   On: 12/02/2020 12:40   DG Hips Bilat W or Wo Pelvis 3-4 Views  Result Date: 12/02/2020 CLINICAL DATA:  Acute hip and pelvic pain following fall. Initial encounter. EXAM: DG HIP (WITH OR WITHOUT PELVIS) 3-4V BILAT COMPARISON:  10/01/2016 and prior studies FINDINGS: No acute fracture or dislocation identified. RIGHT hip hemiarthroplasty and remote fracture/ORIF of the proximal LEFT femur again noted. No complicating hardware features are identified. No focal bony lesions are noted. IMPRESSION: No acute abnormality. Electronically Signed   By: Margarette Canada M.D.   On: 12/02/2020 13:56    EKG: Independently reviewed. Sinus, no st elevations  Assessment/Plan Altered mental status     - multifactorial; treat hypthothermia, hypothyroidism, PNA  Hypothermia     - warming  blankets  Hypothyroidism     - IV levothyroxine     - TSH is 14.3  PNA Volume overload     - required 4L  at admission, but weaning down     - CXR w/ PNA vs pulm edema     - started on abx; check procal     - BNP elevated, check echo     - given 65m IV lasix, follow  CKD 5     - at baseline (SCr and BUN), follow, watch fluid status and nephrotoxins  DM2     - SSI, A1c, glucose checks, carb modified diet when alert  Falls     - PT/OT when he is more alert  Normocytic anemia     - no evidence of bleed, follow  Hypokalemia     - replace K+, check Mg2+  DNR patient     - family confirms that he is hospice, but they want [place] "treat what we can treat"     - will have palliative care consulted   DVT prophylaxis: SCDs  Code Status: DNR  Family Communication: With sister by phone  Consults called: Palliative care.   Status is: Inpatient  Remains inpatient appropriate because:Inpatient level of care appropriate due to severity of illness   Dispo: The patient is from: Home              Anticipated d/c is to: TBD              Anticipated d/c date is: 3 days              Patient currently is not medically stable to d/c.   Difficult to place patient No  TJonnie FinnerDO [facility]  If 7PM-7AM, please contact night-coverage [email or web address]  12/02/2020, 3:12 PM

## 2020-12-02 NOTE — Progress Notes (Signed)
   12/02/20 2252  Integumentary  Integumentary (WDL) X  RN Assisting with Skin Assessment on Admission Pam, RN  Skin Color Appropriate for ethnicity  Skin Condition Dry;Flaky  Other than what has been charted on flowsheet; pt to have multiple scratch marks on bue and ble.  2nd and 3rd toes on left foot with scabs and on right foot scabs noted on 2nd toe.

## 2020-12-02 NOTE — Plan of Care (Signed)
  Problem: Education: Goal: Knowledge of General Education information will improve Description: Including pain rating scale, medication(s)/side effects and non-pharmacologic comfort measures Outcome: Progressing   Problem: Clinical Measurements: Goal: Ability to maintain clinical measurements within normal limits will improve Outcome: Progressing Goal: Will remain free from infection Outcome: Progressing Goal: Diagnostic test results will improve Outcome: Progressing Goal: Respiratory complications will improve Outcome: Progressing   Problem: Activity: Goal: Risk for activity intolerance will decrease Outcome: Progressing   Problem: Nutrition: Goal: Adequate nutrition will be maintained Outcome: Progressing   Problem: Coping: Goal: Level of anxiety will decrease Outcome: Progressing   Problem: Elimination: Goal: Will not experience complications related to bowel motility Outcome: Progressing Goal: Will not experience complications related to urinary retention Outcome: Progressing   Problem: Pain Managment: Goal: General experience of comfort will improve Outcome: Progressing   Problem: Safety: Goal: Ability to remain free from injury will improve Outcome: Progressing   Problem: Skin Integrity: Goal: Risk for impaired skin integrity will decrease Outcome: Progressing   

## 2020-12-02 NOTE — ED Notes (Signed)
Pt has mittens on and RN wrapped IV because pt was trying to pull out the IV

## 2020-12-02 NOTE — ED Notes (Signed)
Pt was at the edge of the bed because he wants to go home.  Staff had to lift him back up in the bed and turn on the bed alarm.

## 2020-12-02 NOTE — ED Notes (Signed)
Patient sitting at edge of bed saying "I want to go home." Patient redirected back into the bed. Bed alarm on. Nonskid socks on patient.

## 2020-12-02 NOTE — ED Notes (Signed)
Patient trying to rip out IV. Mittens applied to patient.

## 2020-12-02 NOTE — ED Notes (Signed)
Placed pt under bear hugger to bring up pt temp

## 2020-12-02 NOTE — Progress Notes (Signed)
A consult was received from an ED provider for Vancomycin and Cefepime per pharmacy dosing.  The patient's profile has been reviewed for ht/wt/allergies/indication/available labs.    A one time order has been placed for Vancomycin '1500mg'$  IV and Cefepime 2g IV.  Further antibiotics/pharmacy consults should be ordered by admitting physician if indicated.                       Thank you, Luiz Ochoa 12/02/2020  1:12 PM

## 2020-12-02 NOTE — Progress Notes (Signed)
Arrived to pt room multiple times.  Pt with attempts out of bed.  Both legs over the side rail.  Pt is yelling out t hat he wants to go home.  Explain the situation.  Pt is not reoriented at this time.  Pt situated in bed and legs put back on the bed.  Will continue to reorient as necessary.

## 2020-12-03 ENCOUNTER — Other Ambulatory Visit: Payer: Self-pay

## 2020-12-03 ENCOUNTER — Inpatient Hospital Stay (HOSPITAL_COMMUNITY)

## 2020-12-03 DIAGNOSIS — Z515 Encounter for palliative care: Secondary | ICD-10-CM

## 2020-12-03 DIAGNOSIS — G9341 Metabolic encephalopathy: Secondary | ICD-10-CM

## 2020-12-03 DIAGNOSIS — E8770 Fluid overload, unspecified: Secondary | ICD-10-CM

## 2020-12-03 DIAGNOSIS — N185 Chronic kidney disease, stage 5: Secondary | ICD-10-CM

## 2020-12-03 DIAGNOSIS — J189 Pneumonia, unspecified organism: Secondary | ICD-10-CM

## 2020-12-03 DIAGNOSIS — E1122 Type 2 diabetes mellitus with diabetic chronic kidney disease: Secondary | ICD-10-CM

## 2020-12-03 DIAGNOSIS — Z7189 Other specified counseling: Secondary | ICD-10-CM

## 2020-12-03 DIAGNOSIS — Z66 Do not resuscitate: Secondary | ICD-10-CM

## 2020-12-03 DIAGNOSIS — J9601 Acute respiratory failure with hypoxia: Secondary | ICD-10-CM

## 2020-12-03 DIAGNOSIS — E876 Hypokalemia: Secondary | ICD-10-CM

## 2020-12-03 LAB — CBC
HCT: 26 % — ABNORMAL LOW (ref 39.0–52.0)
Hemoglobin: 8.2 g/dL — ABNORMAL LOW (ref 13.0–17.0)
MCH: 31.1 pg (ref 26.0–34.0)
MCHC: 31.5 g/dL (ref 30.0–36.0)
MCV: 98.5 fL (ref 80.0–100.0)
Platelets: 192 10*3/uL (ref 150–400)
RBC: 2.64 MIL/uL — ABNORMAL LOW (ref 4.22–5.81)
RDW: 14.7 % (ref 11.5–15.5)
WBC: 5.5 10*3/uL (ref 4.0–10.5)
nRBC: 0 % (ref 0.0–0.2)

## 2020-12-03 LAB — GLUCOSE, CAPILLARY
Glucose-Capillary: 147 mg/dL — ABNORMAL HIGH (ref 70–99)
Glucose-Capillary: 344 mg/dL — ABNORMAL HIGH (ref 70–99)
Glucose-Capillary: 432 mg/dL — ABNORMAL HIGH (ref 70–99)
Glucose-Capillary: 75 mg/dL (ref 70–99)
Glucose-Capillary: 77 mg/dL (ref 70–99)

## 2020-12-03 LAB — COMPREHENSIVE METABOLIC PANEL
ALT: 16 U/L (ref 0–44)
AST: 26 U/L (ref 15–41)
Albumin: 2.9 g/dL — ABNORMAL LOW (ref 3.5–5.0)
Alkaline Phosphatase: 59 U/L (ref 38–126)
Anion gap: 19 — ABNORMAL HIGH (ref 5–15)
BUN: 84 mg/dL — ABNORMAL HIGH (ref 8–23)
CO2: 19 mmol/L — ABNORMAL LOW (ref 22–32)
Calcium: 8 mg/dL — ABNORMAL LOW (ref 8.9–10.3)
Chloride: 102 mmol/L (ref 98–111)
Creatinine, Ser: 6.5 mg/dL — ABNORMAL HIGH (ref 0.61–1.24)
GFR, Estimated: 8 mL/min — ABNORMAL LOW (ref >60)
Glucose, Bld: 406 mg/dL — ABNORMAL HIGH (ref 70–99)
Potassium: 3.9 mmol/L (ref 3.5–5.1)
Sodium: 140 mmol/L (ref 135–145)
Total Bilirubin: 1.1 mg/dL (ref 0.3–1.2)
Total Protein: 6.6 g/dL (ref 6.5–8.1)

## 2020-12-03 LAB — CORTISOL-AM, BLOOD: Cortisol - AM: 21.9 ug/dL (ref 6.7–22.6)

## 2020-12-03 LAB — T4, FREE: Free T4: 0.85 ng/dL (ref 0.61–1.12)

## 2020-12-03 LAB — HEMOGLOBIN A1C
Hgb A1c MFr Bld: 8.7 % — ABNORMAL HIGH (ref 4.8–5.6)
Mean Plasma Glucose: 202.99 mg/dL

## 2020-12-03 MED ORDER — INSULIN ASPART 100 UNIT/ML ~~LOC~~ SOLN
0.0000 [IU] | SUBCUTANEOUS | Status: DC
  Administered 2020-12-03: 4 [IU] via SUBCUTANEOUS
  Administered 2020-12-03: 6 [IU] via SUBCUTANEOUS
  Administered 2020-12-04: 2 [IU] via SUBCUTANEOUS
  Administered 2020-12-04: 1 [IU] via SUBCUTANEOUS
  Administered 2020-12-05: 3 [IU] via SUBCUTANEOUS
  Administered 2020-12-05 – 2020-12-06 (×5): 1 [IU] via SUBCUTANEOUS
  Administered 2020-12-06 (×3): 2 [IU] via SUBCUTANEOUS
  Administered 2020-12-06: 1 [IU] via SUBCUTANEOUS

## 2020-12-03 MED ORDER — LEVALBUTEROL HCL 0.63 MG/3ML IN NEBU
0.6300 mg | INHALATION_SOLUTION | Freq: Three times a day (TID) | RESPIRATORY_TRACT | Status: DC | PRN
  Administered 2020-12-03 (×3): 0.63 mg via RESPIRATORY_TRACT
  Filled 2020-12-03 (×4): qty 3

## 2020-12-03 NOTE — Assessment & Plan Note (Signed)
-  Patient started on oxygen on admission, 4 L.  Etiology considered multifactorial in setting of underlying volume overload with possible superimposed infiltrates concerning for infection.  He also likely has a component of atelectasis given his poor functional status -Continue cefepime -Continue oxygen, wean as able -Continue Lasix

## 2020-12-03 NOTE — Progress Notes (Signed)
Chaplain responded to spiritual consult.  Spoke with pt's nurse to learn this morning pt has been disoriented, agitated, trying to get out of the bed.  Nurse reports pt is presently calm.  Nurse and Chaplain discussed how a pastoral visit right now might serve to agitate pt again, so Chaplain did not visit at this time.  [place]

## 2020-12-03 NOTE — Assessment & Plan Note (Signed)
-  patient has history of CKD5. Baseline creat ~ 6.5 - 7.5, eGFR 8 -Continues to have progressive renal decline notably since last year -Currently at baseline.  Not a dialysis candidate and followed by hospice at home

## 2020-12-03 NOTE — Assessment & Plan Note (Signed)
-   patient symptoms include AMS/confusion - etiology considered due to metabolic derangements of hypothermia contributing  - mentation improved this am, and he is back to baseline

## 2020-12-03 NOTE — Progress Notes (Signed)
Pt attempting to set up in bed.  Pt continues to yell out of room.  Pt has leg over side rail.  Legs put back in the bed.  Pt situated in bed and reoriented.  This has not been effective.  Pt has put legs over siderail again.  Pt repositioned in bed and reoriented.

## 2020-12-03 NOTE — TOC Progression Note (Signed)
Transition of Care [facility]) - Progression Note    Patient Details  Name: [name] [name] MRN: [id number] Date of Birth: 08-26-1946  [facility]) CM/SW Contact  Purcell Mouton, RN Phone Number: 12/03/2020, 3:19 PM  Clinical Narrative:    Will continue to follow for discharge to SNF or home.    Expected Discharge Plan: [place] Barriers to Discharge: No Barriers Identified  Expected Discharge Plan and Services Expected Discharge Plan: [place] arrangements for the past 2 months: Single Family Home                                       Social Determinants of Health (SDOH) Interventions    Readmission Risk Interventions Readmission Risk Prevention Plan 04/20/2020  Transportation Screening Complete  Medication Review [employer]) Complete  SW Recovery Care/Counseling Consult Complete  Palliative Care Screening Complete  Skilled Nursing Facility Complete  Some recent data might be hidden

## 2020-12-03 NOTE — Progress Notes (Signed)
[facility] Hospital[facility] hospice patient note.  [name] [name] is a current hospice patient with a terminal diagnosis of ESRD. Family activated EMS after patient started experiencing increased SOB. Hospice was not notified prior to transfer to ED.  Patient was admitted to [facility] on 12/02/20 with a diagnosis of hypoxia and hypothermia. Per Dr. Orpah Melter with [facility] this is a related hospital admission.   Visited patient at bedside, not family present.  Patient is awake, alert and oriented to person. Speech is slurred. No apparent distress.   He states he wants to return home.   VS: 97.6, 142/82, 80, 18, 98% on 2Lnc  Abn Labs: 12/03/2020 04:12 CO2: 19 (L) Glucose: 406 (H) BUN: 84 (H) Creatinine: 6.50 (H) Calcium: 8.0 (L) Anion gap: 19 (H) Albumin: 2.9 (L) GFR, Estimated: 8 (L) RBC: 2.64 (L) Hemoglobin: 8.2 (L) HCT: 26.0 (L) Troponin I (High Sensitivity): 33 (H) Lactic Acid, Venous: 2.0 (HH)  Epic Imaging: CT Head/Spine IMPRESSION: 1. No evidence of acute intracranial abnormality. Atrophy and chronic small-vessel white matter ischemic changes. 2. No static evidence of acute injury to the cervical spine. 3. Bilateral pleural effusions, at least moderate sized, and ground-glass opacities within the lung apices. CXR IMPRESSION: BILATERAL pleural effusions and basilar atelectasis. Persistent pulmonary infiltrates question pulmonary edema versus Pneumonia.  IV and PRN medications Maxipime 1g IVPB QD Xopenex neb 0.'63mg'$  PRN @ [id number], [id number]  Problem list Acute respiratory failure with hypoxia  Hypothermia-resolved CAP Hypokalemia  Normocytic Anemia  Diabetes Type 2 CKD stage V Acute metabolic encephalopathy-resolved as of 12/03/2020  Discharge planning: Return home with hospice Family Contact: [facility] CSW spoke with family today. IDG: Updated Goals of care: Clear. DNR  Please use [facility] for ambulance transport at discharge.  A Please do not hesitate to  call with questions.    Thank you,   Farrel Gordon, RN, [place] (listed on [facility] under [place])    [phone]

## 2020-12-03 NOTE — Assessment & Plan Note (Signed)
-   continue SSI and CBG monitoring  

## 2020-12-03 NOTE — Progress Notes (Signed)
Pt would not allow for vs measurement d/t agitation.  Since this has caused iincreased agitation; will attempt to get vitals at a later time.

## 2020-12-03 NOTE — Progress Notes (Signed)
Pt is combative and hitting at Probation officer.  Attempted to reorient pt but pt is not able to follow commands currently.  Pt attempting to pull off mitts and legs are over the bed rail.  Legs put back in the bed but pt puts legs over the rail again trying to raise up.  Pt medicated per md order. Bed alarm is on sensitive starting from when pt arrived to unit.

## 2020-12-03 NOTE — Hospital Course (Signed)
Mr. Quiroa is a 75 yo male with PMH DMII, CVA (residual cognitive impairment), HTN, CKDV who presented with worsening mentation.  He has been falling out of bed recently more as well.  EMS made multiple visits to the house however due to his worsening confusion and some wheezing/cough he was brought to the ER for further evaluation. He was recently hospitalized for similar from 11/25/2020 until 11/28/2020 for hypothermia and volume overload.  He was treated for possible pneumonia as well and discharged on antibiotics to complete course. He was initiated on IV Synthroid on admission due to ongoing elevated TSH with concern for improper administration of Synthroid at home as well as possible missed doses. Family confirmed that hospice is on board at home and patient is DNR/DNI however they wish to treat what is able to be "treated".

## 2020-12-03 NOTE — Progress Notes (Signed)
   12/03/20 0039  Provider Notification  Provider Name/Title Dr Kennon Holter  Date Provider Notified 12/03/20  Time Provider Notified 0040  Notification Type Page  Notification Reason Change in status  Pt with audible wheezes throughout lung fields. Pt with shortness of breath that is exacerbated by pt repeated attempts to get out of bed.

## 2020-12-03 NOTE — Assessment & Plan Note (Signed)
-  Persistent infiltrates seen on CXR which may be consistent with pneumonia versus edema -Started on cefepime on admission, will continue -Also continue Lasix -Monitor respiratory response and weaning oxygen as able

## 2020-12-03 NOTE — Progress Notes (Signed)
PROGRESS NOTE    [name] [name]   RXV:[id number]  DOB: 1946-03-17  DOA: 12/02/2020     1  PCP: Libby Maw, MD  CC: AMS  Hospital Course: Mr. Matters is a 75 yo male with PMH DMII, CVA (residual cognitive impairment), HTN, CKDV who presented with worsening mentation.  He has been falling out of bed recently more as well.  EMS made multiple visits to the house however due to his worsening confusion and some wheezing/cough he was brought to the ER for further evaluation. He was recently hospitalized for similar from 11/25/2020 until 11/28/2020 for hypothermia and volume overload.  He was treated for possible pneumonia as well and discharged on antibiotics to complete course. He was initiated on IV Synthroid on admission due to ongoing elevated TSH with concern for improper administration of Synthroid at home as well as possible missed doses. Family confirmed that hospice is on board at home and patient is DNR/DNI however they wish to treat what is able to be "treated".   Interval History:  Resting in bed comfortable this morning.  He was noted to be agitated overnight and trying to get out of bed.  This morning he is not agitated but still is impulsive and trying to get out of bed.  Mittens in place and may still possibly need roll belt for safety.  Reports of ongoing falls at home as well.  ROS: Constitutional: negative for chills and fevers, Respiratory: negative for dyspnea on exertion, Cardiovascular: negative for chest pain and Gastrointestinal: negative for abdominal pain  Assessment & Plan: * Hypothermia-resolved as of 12/03/2020 - there were reports of heating issues at home however TSH has also been elevated consistently recently (?improper dosing/administration at home) - continue warming blankets and IV synthroid - not tremendous findings to suggest myxedema but patient at risk for worsening - see hypothyroidism as well   Hypothyroidism - suspect inappropriate  administration of synthroid at home and patient likely not taking it daily due to his progressive functional decline; also can be elevated in setting of acute illness/functional decline - reasonable to continue IV synthroid for now and transition to oral in 2-3 days - TSH 14.363 - follow up FT4 (0.85, normal) and TT3  Goals of care, counseling/discussion - patient followed by hospice outpatient - treating reversible medical causes as able  Acute respiratory failure with hypoxia ([place]) -Patient started on oxygen on admission, 4 L.  Etiology considered multifactorial in setting of underlying volume overload with possible superimposed infiltrates concerning for infection.  He also likely has a component of atelectasis given his poor functional status -Continue cefepime -Continue oxygen, wean as able -Continue Lasix  CAP (community acquired pneumonia) -Persistent infiltrates seen on CXR which may be consistent with pneumonia versus edema -Started on cefepime on admission, will continue -Also continue Lasix -Monitor respiratory response and weaning oxygen as able  Hypokalemia - replete and recheck as needed  Type 2 diabetes mellitus with stage 5 chronic kidney disease ([place]) - continue SSI and CBG monitoring   Normocytic anemia - considered ACD - baseline 8-10 g/dL. Currently at baseline  CKD (chronic kidney disease), stage V ([place]) - patient has history of CKD5. Baseline creat ~ 6.5 - 7.5, eGFR 8 -Continues to have progressive renal decline notably since last year -Currently at baseline.  Not a dialysis candidate and followed by hospice at home   Acute metabolic encephalopathy-resolved as of 12/03/2020 - patient symptoms include AMS/confusion - etiology considered due to metabolic derangements of hypothermia contributing  -  mentation improved this am, and he is back to baseline       Old records reviewed in assessment of this patient  Antimicrobials: Cefepime 2/20 >>  current  DVT prophylaxis: SCDs Start: 12/02/20 1925   Code Status:   Code Status: DNR Family Communication:   Disposition Plan: Status is: Inpatient  Remains inpatient appropriate because:IV treatments appropriate due to intensity of illness or inability to take PO and Inpatient level of care appropriate due to severity of illness   Dispo: The patient is from: Home              Anticipated d/c is to: Home              Anticipated d/c date is: 2 days              Patient currently is not medically stable to d/c.   Difficult to place patient No  Risk of unplanned readmission score: Unplanned Admission- Pilot do not use: 66.11   Objective: Blood pressure (!) 142/82, pulse 80, temperature 97.6 F (36.4 C), temperature source Oral, resp. rate 18, height '5\' 9"'  (1.753 m), weight 75.1 kg, SpO2 93 %.  Examination: General appearance: Chronically ill-appearing elderly man lying in bed in no distress with obvious mumbled speech but no true dysarthria or aphasia Head: Normocephalic, without obvious abnormality, atraumatic Eyes: EOMI Throat: Normal oral mucosa.  No true macroglossia Lungs: clear to auscultation bilaterally Heart: regular rate and rhythm and S1, S2 normal Abdomen: normal findings: bowel sounds normal and soft, non-tender Skin: dry/indurated skin on LE's Extremities: 1+ LE edema Neurologic: moves all 4 extremities and follows commands; speech is difficult to understand from poor enunciation but no obvious dysarthria or aphasia   Consultants:     Procedures:     Data Reviewed: I have personally reviewed following labs and imaging studies Results for orders placed or performed during the hospital encounter of 12/02/20 (from the past 24 hour(s))  Blood gas, venous (at [facility] and [facility], not at [facility])     Status: Abnormal   Collection Time: 12/02/20  2:46 PM  Result Value Ref Range   pH, Ven 7.328 7.250 - 7.430   pCO2, Ven 51.1 44.0 - 60.0 mmHg   pO2, Ven 90.2 (H) 32.0 - 45.0  mmHg   Bicarbonate 26.1 20.0 - 28.0 mmol/L   Acid-Base Excess 0.5 0.0 - 2.0 mmol/L   O2 Saturation 97.4 %   Patient temperature 98.6   Lactic acid, plasma     Status: Abnormal   Collection Time: 12/02/20  2:50 PM  Result Value Ref Range   Lactic Acid, Venous 2.0 (HH) 0.5 - 1.9 mmol/L  Troponin I (High Sensitivity)     Status: Abnormal   Collection Time: 12/02/20  2:50 PM  Result Value Ref Range   Troponin I (High Sensitivity) 33 (H) <18 ng/L  Procalcitonin - Baseline     Status: None   Collection Time: 12/02/20  2:50 PM  Result Value Ref Range   Procalcitonin 0.32 ng/mL  Urinalysis, Routine w reflex microscopic     Status: Abnormal   Collection Time: 12/02/20  4:30 PM  Result Value Ref Range   Color, Urine YELLOW YELLOW   APPearance CLEAR CLEAR   Specific Gravity, Urine 1.013 1.005 - 1.030   pH 6.0 5.0 - 8.0   Glucose, UA >=500 (A) NEGATIVE mg/dL   Hgb urine dipstick SMALL (A) NEGATIVE   Bilirubin Urine NEGATIVE NEGATIVE   Ketones, ur 5 (A) NEGATIVE mg/dL  Protein, ur >=300 (A) NEGATIVE mg/dL   Nitrite NEGATIVE NEGATIVE   Leukocytes,Ua NEGATIVE NEGATIVE   RBC / HPF 0-5 0 - 5 RBC/hpf   WBC, UA 0-5 0 - 5 WBC/hpf   Bacteria, UA RARE (A) NONE SEEN   Squamous Epithelial / LPF 0-5 0 - 5  Magnesium     Status: Abnormal   Collection Time: 12/02/20  7:25 PM  Result Value Ref Range   Magnesium 2.6 (H) 1.7 - 2.4 mg/dL  MRSA PCR Screening     Status: Abnormal   Collection Time: 12/02/20  7:25 PM   Specimen: Nasopharyngeal  Result Value Ref Range   MRSA by PCR POSITIVE (A) NEGATIVE  CBG monitoring, ED     Status: Abnormal   Collection Time: 12/02/20  9:59 PM  Result Value Ref Range   Glucose-Capillary 271 (H) 70 - 99 mg/dL  Comprehensive metabolic panel     Status: Abnormal   Collection Time: 12/03/20  4:12 AM  Result Value Ref Range   Sodium 140 135 - 145 mmol/L   Potassium 3.9 3.5 - 5.1 mmol/L   Chloride 102 98 - 111 mmol/L   CO2 19 (L) 22 - 32 mmol/L   Glucose, Bld 406  (H) 70 - 99 mg/dL   BUN 84 (H) 8 - 23 mg/dL   Creatinine, Ser 6.50 (H) 0.61 - 1.24 mg/dL   Calcium 8.0 (L) 8.9 - 10.3 mg/dL   Total Protein 6.6 6.5 - 8.1 g/dL   Albumin 2.9 (L) 3.5 - 5.0 g/dL   AST 26 15 - 41 U/L   ALT 16 0 - 44 U/L   Alkaline Phosphatase 59 38 - 126 U/L   Total Bilirubin 1.1 0.3 - 1.2 mg/dL   GFR, Estimated 8 (L) >60 mL/min   Anion gap 19 (H) 5 - 15  CBC     Status: Abnormal   Collection Time: 12/03/20  4:12 AM  Result Value Ref Range   WBC 5.5 4.0 - 10.5 K/uL   RBC 2.64 (L) 4.22 - 5.81 MIL/uL   Hemoglobin 8.2 (L) 13.0 - 17.0 g/dL   HCT 26.0 (L) 39.0 - 52.0 %   MCV 98.5 80.0 - 100.0 fL   MCH 31.1 26.0 - 34.0 pg   MCHC 31.5 30.0 - 36.0 g/dL   RDW 14.7 11.5 - 15.5 %   Platelets 192 150 - 400 K/uL   nRBC 0.0 0.0 - 0.2 %  Hemoglobin A1c     Status: Abnormal   Collection Time: 12/03/20  7:55 AM  Result Value Ref Range   Hgb A1c MFr Bld 8.7 (H) 4.8 - 5.6 %   Mean Plasma Glucose 202.99 mg/dL  Glucose, capillary     Status: Abnormal   Collection Time: 12/03/20  8:00 AM  Result Value Ref Range   Glucose-Capillary 432 (H) 70 - 99 mg/dL  Cortisol-am, blood     Status: None   Collection Time: 12/03/20  9:40 AM  Result Value Ref Range   Cortisol - AM 21.9 6.7 - 22.6 ug/dL  T4, free     Status: None   Collection Time: 12/03/20  9:40 AM  Result Value Ref Range   Free T4 0.85 0.61 - 1.12 ng/dL  Glucose, capillary     Status: Abnormal   Collection Time: 12/03/20 12:13 PM  Result Value Ref Range   Glucose-Capillary 344 (H) 70 - 99 mg/dL    Recent Results (from the past 240 hour(s))  Resp Panel by RT-PCR (Flu A&B,  Covid) Nasopharyngeal Swab     Status: None   Collection Time: 11/25/20  6:55 PM   Specimen: Nasopharyngeal Swab; Nasopharyngeal(NP) swabs in vial transport medium  Result Value Ref Range Status   SARS Coronavirus 2 by RT PCR NEGATIVE NEGATIVE Final    Comment: (NOTE) SARS-CoV-2 target nucleic acids are NOT DETECTED.  The SARS-CoV-2 RNA is generally  detectable in upper respiratory specimens during the acute phase of infection. The lowest concentration of SARS-CoV-2 viral copies this assay can detect is 138 copies/mL. A negative result does not preclude SARS-Cov-2 infection and should not be used as the sole basis for treatment or other patient management decisions. A negative result may occur with  improper specimen collection/handling, submission of specimen other than nasopharyngeal swab, presence of viral mutation(s) within the areas targeted by this assay, and inadequate number of viral copies(<138 copies/mL). A negative result must be combined with clinical observations, patient history, and epidemiological information. The expected result is Negative.  Fact Sheet for Patients:  EntrepreneurPulse.com.au  Fact Sheet for Healthcare Providers:  IncredibleEmployment.be  This test is no t yet approved or cleared by the [place] FDA and  has been authorized for detection and/or diagnosis of SARS-CoV-2 by FDA under an Emergency Use Authorization (EUA). This EUA will remain  in effect (meaning this test can be used) for the duration of the COVID-19 declaration under Section 564(b)(1) of the Act, 21 U.S.C.section 360bbb-3(b)(1), unless the authorization is terminated  or revoked sooner.       Influenza A by PCR NEGATIVE NEGATIVE Final   Influenza B by PCR NEGATIVE NEGATIVE Final    Comment: (NOTE) The Xpert Xpress SARS-CoV-2/FLU/RSV plus assay is intended as an aid in the diagnosis of influenza from Nasopharyngeal swab specimens and should not be used as a sole basis for treatment. Nasal washings and aspirates are unacceptable for Xpert Xpress SARS-CoV-2/FLU/RSV testing.  Fact Sheet for Patients: EntrepreneurPulse.com.au  Fact Sheet for Healthcare Providers: IncredibleEmployment.be  This test is not yet approved or cleared by the [place] FDA  and has been authorized for detection and/or diagnosis of SARS-CoV-2 by FDA under an Emergency Use Authorization (EUA). This EUA will remain in effect (meaning this test can be used) for the duration of the COVID-19 declaration under Section 564(b)(1) of the Act, 21 U.S.C. section 360bbb-3(b)(1), unless the authorization is terminated or revoked.  Performed at [facility], [place] [address] [place], [place] [address]   Culture, blood (single)     Status: None   Collection Time: 11/25/20  7:24 PM   Specimen: BLOOD  Result Value Ref Range Status   Specimen Description BLOOD RIGHT HAND  Final   Special Requests   Final    BOTTLES DRAWN AEROBIC AND ANAEROBIC Blood Culture adequate volume   Culture   Final    NO GROWTH 5 DAYS Performed at [facility], [address], [place], [place] [address]    Report Status 11/30/2020 FINAL  Final  Resp Panel by RT-PCR (Flu A&B, Covid) Nasopharyngeal Swab     Status: None   Collection Time: 12/02/20 12:27 PM   Specimen: Nasopharyngeal Swab; Nasopharyngeal(NP) swabs in vial transport medium  Result Value Ref Range Status   SARS Coronavirus 2 by RT PCR NEGATIVE NEGATIVE Final    Comment: (NOTE) SARS-CoV-2 target nucleic acids are NOT DETECTED.  The SARS-CoV-2 RNA is generally detectable in upper respiratory specimens during the acute phase of infection. The lowest concentration of SARS-CoV-2 viral copies this assay  can detect is 138 copies/mL. A negative result does not preclude SARS-Cov-2 infection and should not be used as the sole basis for treatment or other patient management decisions. A negative result may occur with  improper specimen collection/handling, submission of specimen other than nasopharyngeal swab, presence of viral mutation(s) within the areas targeted by this assay, and inadequate number of viral copies(<138 copies/mL). A negative result must be combined with clinical observations, patient history, and  epidemiological information. The expected result is Negative.  Fact Sheet for Patients:  EntrepreneurPulse.com.au  Fact Sheet for Healthcare Providers:  IncredibleEmployment.be  This test is no t yet approved or cleared by the [place] FDA and  has been authorized for detection and/or diagnosis of SARS-CoV-2 by FDA under an Emergency Use Authorization (EUA). This EUA will remain  in effect (meaning this test can be used) for the duration of the COVID-19 declaration under Section 564(b)(1) of the Act, 21 U.S.C.section 360bbb-3(b)(1), unless the authorization is terminated  or revoked sooner.       Influenza A by PCR NEGATIVE NEGATIVE Final   Influenza B by PCR NEGATIVE NEGATIVE Final    Comment: (NOTE) The Xpert Xpress SARS-CoV-2/FLU/RSV plus assay is intended as an aid in the diagnosis of influenza from Nasopharyngeal swab specimens and should not be used as a sole basis for treatment. Nasal washings and aspirates are unacceptable for Xpert Xpress SARS-CoV-2/FLU/RSV testing.  Fact Sheet for Patients: EntrepreneurPulse.com.au  Fact Sheet for Healthcare Providers: IncredibleEmployment.be  This test is not yet approved or cleared by the [place] FDA and has been authorized for detection and/or diagnosis of SARS-CoV-2 by FDA under an Emergency Use Authorization (EUA). This EUA will remain in effect (meaning this test can be used) for the duration of the COVID-19 declaration under Section 564(b)(1) of the Act, 21 U.S.C. section 360bbb-3(b)(1), unless the authorization is terminated or revoked.  Performed at [facility], [place] [address] [place], [place] [address]   Blood Culture (routine x 2)     Status: None (Preliminary result)   Collection Time: 12/02/20 12:27 PM   Specimen: BLOOD  Result Value Ref Range Status   Specimen Description   Final    BLOOD RIGHT  ANTECUBITAL Performed at [facility] [address], [place], [place] [address]    Special Requests   Final    BOTTLES DRAWN AEROBIC AND ANAEROBIC Blood Culture adequate volume Performed at [facility] [address], [place], [place] [address]    Culture   Final    NO GROWTH < 24 HOURS Performed at [facility] [address], [place], [place] [address]    Report Status PENDING  Incomplete  Blood Culture (routine x 2)     Status: None (Preliminary result)   Collection Time: 12/02/20 12:32 PM   Specimen: BLOOD  Result Value Ref Range Status   Specimen Description   Final    BLOOD LEFT ANTECUBITAL Performed at [facility] [address] [place], [place] [address]    Special Requests   Final    BOTTLES DRAWN AEROBIC AND ANAEROBIC Blood Culture adequate volume Performed at [facility] [address] [place], [place] [address]    Culture   Final    NO GROWTH < 24 HOURS Performed at [facility] [address], [place], [place] [address]    Report Status PENDING  Incomplete  MRSA PCR Screening     Status: Abnormal  Collection Time: 12/02/20  7:25 PM   Specimen: Nasopharyngeal  Result Value Ref Range Status   MRSA by PCR POSITIVE (A) NEGATIVE Final    Comment:        The GeneXpert MRSA Assay (FDA approved for NASAL specimens only), is one component of a comprehensive MRSA colonization surveillance program. It is not intended to diagnose MRSA infection nor to guide or monitor treatment for MRSA infections. RESULT CALLED TO, READ BACK BY AND VERIFIED WITH: Gunnar Bulla RN 12/02/20 '@2317'  BY P.HENDERSON Performed at [facility] [address] [place], [place] [address]      Radiology Studies: CT Head Wo Contrast  Result Date: 12/02/2020 CLINICAL DATA:  75 year old male with head and neck injury following fall. Altered mental status. EXAM: CT HEAD  WITHOUT CONTRAST CT CERVICAL SPINE WITHOUT CONTRAST TECHNIQUE: Multidetector CT imaging of the head and cervical spine was performed following the standard protocol without intravenous contrast. Multiplanar CT image reconstructions of the cervical spine were also generated. COMPARISON:  11/25/2020 CT/MR and prior studies. FINDINGS: CT HEAD FINDINGS Brain: No evidence of acute infarction, hemorrhage, hydrocephalus, extra-axial collection or new mass lesion/mass effect. 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 Atrophy and chronic small-vessel white matter ischemic changes are again noted. Vascular: Carotid atherosclerotic calcifications are noted. Skull: Normal. Negative for fracture or focal lesion. Sinuses/Orbits: No acute finding. Other: None. CT CERVICAL SPINE FINDINGS Alignment: Loss of the normal cervical lordosis without acute subluxation. Skull base and vertebrae: No acute fracture. No primary bone lesion or focal pathologic process. Soft tissues and spinal canal: No prevertebral fluid or swelling. No visible canal hematoma. Disc levels: Moderate to severe multilevel degenerative disc disease and spondylosis from C3-C7 again noted and again contributing to central spinal and bony foraminal narrowing. Upper chest: Bilateral pleural effusions are noted, at least moderate sized. Ground-glass opacities within the lung apices are noted. Other: None IMPRESSION: 1. No evidence of acute intracranial abnormality. Atrophy and chronic small-vessel white matter ischemic changes. 2. No static evidence of acute injury to the cervical spine. 3. Bilateral pleural effusions, at least moderate sized, and ground-glass opacities within the lung apices. Electronically Signed   By: Margarette Canada M.D.   On: 12/02/2020 14:08   CT Cervical Spine Wo Contrast  Result Date: 12/02/2020 CLINICAL DATA:  75 year old male with head and neck injury following fall.  Altered mental status. EXAM: CT HEAD WITHOUT CONTRAST CT CERVICAL SPINE WITHOUT CONTRAST TECHNIQUE: Multidetector CT imaging of the head and cervical spine was performed following the standard protocol without intravenous contrast. Multiplanar CT image reconstructions of the cervical spine were also generated. COMPARISON:  11/25/2020 CT/MR and prior studies. FINDINGS: CT HEAD FINDINGS Brain: No evidence of acute infarction, hemorrhage, hydrocephalus, extra-axial collection or new mass lesion/mass effect. 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 Atrophy and chronic small-vessel white matter ischemic changes are again noted. Vascular: Carotid atherosclerotic calcifications are noted. Skull: Normal. Negative for fracture or focal lesion. Sinuses/Orbits: No acute finding. Other: None. CT CERVICAL SPINE FINDINGS Alignment: Loss of the normal cervical lordosis without acute subluxation. Skull base and vertebrae: No acute fracture. No primary bone lesion or focal pathologic process. Soft tissues and spinal canal: No prevertebral fluid or swelling. No visible canal hematoma. Disc levels: Moderate to severe multilevel degenerative disc disease and spondylosis from C3-C7 again noted and again contributing  to central spinal and bony foraminal narrowing. Upper chest: Bilateral pleural effusions are noted, at least moderate sized. Ground-glass opacities within the lung apices are noted. Other: None IMPRESSION: 1. No evidence of acute intracranial abnormality. Atrophy and chronic small-vessel white matter ischemic changes. 2. No static evidence of acute injury to the cervical spine. 3. Bilateral pleural effusions, at least moderate sized, and ground-glass opacities within the lung apices. Electronically Signed   By: Margarette Canada M.D.   On: 12/02/2020 14:08   DG Chest Port 1 View  Result Date: 12/02/2020 CLINICAL DATA:  Questionable sepsis, history  diabetes mellitus, hypertension EXAM: PORTABLE CHEST 1 VIEW COMPARISON:  Portable exam 1233 hours compared to 11/27/2020 FINDINGS: Enlargement of cardiac silhouette with slight vascular congestion. Mediastinal contours normal. BILATERAL pleural effusions and basilar atelectasis. Mild perihilar to basilar infiltrates question pulmonary edema versus pneumonia. No pneumothorax or acute osseous findings. Calcified granuloma lower RIGHT lung. IMPRESSION: BILATERAL pleural effusions and basilar atelectasis. Persistent pulmonary infiltrates question pulmonary edema versus pneumonia. Electronically Signed   By: Lavonia Dana M.D.   On: 12/02/2020 12:40   DG Hips Bilat W or Wo Pelvis 3-4 Views  Result Date: 12/02/2020 CLINICAL DATA:  Acute hip and pelvic pain following fall. Initial encounter. EXAM: DG HIP (WITH OR WITHOUT PELVIS) 3-4V BILAT COMPARISON:  10/01/2016 and prior studies FINDINGS: No acute fracture or dislocation identified. RIGHT hip hemiarthroplasty and remote fracture/ORIF of the proximal LEFT femur again noted. No complicating hardware features are identified. No focal bony lesions are noted. IMPRESSION: No acute abnormality. Electronically Signed   By: Margarette Canada M.D.   On: 12/02/2020 13:56   CT Head Wo Contrast  Final Result    CT Cervical Spine Wo Contrast  Final Result    DG Hips Bilat W or Wo Pelvis 3-4 Views  Final Result    DG Chest Port 1 View  Final Result      Scheduled Meds: . Chlorhexidine Gluconate Cloth  6 each Topical Q0600  . furosemide  40 mg Intravenous Daily  . insulin aspart  0-6 Units Subcutaneous Q4H  . levothyroxine  25 mcg Intravenous Daily  . mupirocin ointment  1 application Nasal BID   PRN Meds: acetaminophen **OR** acetaminophen, levalbuterol Continuous Infusions: . ceFEPime (MAXIPIME) IV 1 g (12/03/20 1400)     LOS: 1 day  Time spent: Greater than 50% of the 35 minute visit was spent in counseling/coordination of care for the patient as laid out in  the A&P.   [name], MD [facility] 12/03/2020, 2:42 PM

## 2020-12-03 NOTE — Progress Notes (Signed)
Pt yelling out of room saying that he wants to get out of here.  Pt with legs over side rail.  Legs put back in bed.  Reoriented.  Much to no avail.  Pt attempting to take green mitts off but has not been successful.  Pt repositioned in bed.

## 2020-12-03 NOTE — Assessment & Plan Note (Signed)
-   patient followed by hospice outpatient - treating reversible medical causes as able

## 2020-12-03 NOTE — Assessment & Plan Note (Signed)
-   there were reports of heating issues at home however TSH has also been elevated consistently recently (?improper dosing/administration at home) - continue warming blankets and IV synthroid - not tremendous findings to suggest myxedema but patient at risk for worsening - see hypothyroidism as well

## 2020-12-03 NOTE — Consult Note (Signed)
Consultation Note Date: 12/03/2020   Patient Name: [name] [name]  DOB: [birth year]  MRN: [id number]  Age / Sex: 75 y.o., male   PCP: [name] Maw, MD Referring Physician: [name], MD   REASON FOR CONSULTATION:Establishing goals of care  Palliative Care consult requested for goals of care discussion in this 75 y.o. male with multiple medical problems including diabetes, hypertension, hypothyroidism, and multiple CVAs. Patient presented to the ER from home with complaints of altered mental status. During work-up found to be hypothermic and hypoxic. Chest x-ray showed possible pneumonia. Patient was started on IV antibiotics and warming blanket. Patient had recent hospitalization with similar presentation. At that time he was discharged home on antibiotics. He is followed by [name] [name].   Clinical Assessment and Goals of Care: I have reviewed medical records including lab results, imaging, Epic notes, and MAR. I spoke with patient's sisters [name] [name] and [name] [name] to discuss diagnosis prognosis, [name] [name], [name] [name] wishes, disposition and options.  Patient is familiar to our Palliative team from previous admissions. I re-introduced Palliative Medicine as specialized medical care for people living with serious illness. It focuses on providing relief from the symptoms and stress of a serious illness. The goal is to improve quality of life for both the patient and the family.  Patient lives in the home with his sistrs who are also caring for their 23 year old mother. Sisters provide care for both she and patient 24/7.   [name] [name] shares patient was doing fairly well prior to February where he has seemed to further decline. She reports on his best day family assist him with ADLs. He wore a depend due to incontinence. He is able to ambulate with standby assist and a walker. Sisters reports patient's appetite was fair. [name] [name] reports patient ate a half of gallon of ice cream the day prior to  admission.   We discussed His current illness and what it means in the larger context of His on-going co-morbidities.  Natural disease trajectory and expectations at [name] [name] were discussed.  I discussed at length with family patient's continued decline which sisters verbalized their awareness of. Detailed education provided on end-of-life expectations. Sisters verbalized understanding.   I attempted to elicit values and goals of care important to the patient.    The difference between aggressive medical intervention and comfort care was considered in light of the patient's goals of care.  Family verbalized their awareness of patient's decline and understanding of signs and symptoms of end-of-life. [name] [name] confirms patient is actively under the care of [name] [name] which family wishes to continue at discharge.   [name] [name] is clear in expressing family's wishes to continue to treat the treatable while [name] [name] is hospitalized. They are not interested in escalation of care or aggressive interventions. They remain hopeful he will be able to return home with family and spend what time he has left.   We discussed patient's re-hospitalizations for similar health concerns and also discussed symptom management specifically given patient's current agitation. [name] [name] states he has similar behaviors at home however will eventually calm down. Although focus is on comfort family is requesting to administer medications in the evening/bedtime hours to allow patient to be as alert as possible throughout the day. They do share if behaviors become a threatening concern while hospitalized they are in agreement with medication with awareness patient may be somewhat somnolent as a result.   Family states they are familiar with residential [name] however, they would like for patient  to remain at home if at all possible but would be open to a [name] facility if patient's symptoms were unable to be managed or he was in distress in  the home.   Family confirms DNR/DNI, no artificial feedings/PEG, no dialysis, no aggressive interventions.   Questions and concerns were addressed. The family was encouraged to call with questions or concerns.  PMT will continue to support holistically.   SOCIAL HISTORY:     reports that he has never smoked. He has never used smokeless tobacco. He reports that he does not drink alcohol and does not use drugs.  CODE STATUS: DNR  ADVANCE DIRECTIVES: Primary Decision Maker: Sisters: [name] [name] and [name] [name]  SYMPTOM MANAGEMENT: agitation  Palliative Prophylaxis:   Aspiration, Bowel Regimen, Delirium Protocol, Frequent Pain Assessment, Oral Care and Turn Reposition  PSYCHO-SOCIAL/SPIRITUAL:  Support System: Family  Desire for further Chaplaincy support:No  Additional Recommendations (Limitations, Scope, Preferences):  Minimize Medications, No Artificial Feeding, No Hemodialysis and treat the treatable with a goal of discharging home with continued [name] care   Education on [name]/palliative    PAST MEDICAL HISTORY: Past Medical History:  Diagnosis Date  . 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 Closed comminuted intertrochanteric fracture of left femur ([place])   . Diabetes mellitus without complication ([place])   . Diabetic hyperosmolar non-ketotic state ([place]) 05/15/2016  . DKA (diabetic ketoacidoses) 05/09/2016  . Hypertension   . Hypoglycemia 11/29/2019  . Postoperative anemia due to acute blood loss 06/18/2016  . Retroperitoneal hematoma 06/18/2016  . Stroke ([place])   . Vitamin B 12 deficiency 06/18/2016    ALLERGIES:  has No Known Allergies.   MEDICATIONS:  Current Facility-Administered Medications  Medication Dose Route Frequency Provider Last Rate Last Admin  . acetaminophen (TYLENOL) tablet 650 mg  650 mg Oral Q6H PRN Marylyn Ishihara, Tyrone A, DO       Or  . acetaminophen (TYLENOL) suppository 650 mg  650 mg Rectal Q6H PRN Marylyn Ishihara,  Tyrone A, DO      . ceFEPIme (MAXIPIME) 1 g in sodium chloride 0.9 % 100 mL IVPB  1 g Intravenous Q24H Pham, Anh P, RPH      . Chlorhexidine Gluconate Cloth 2 % PADS 6 each  6 each Topical Q0600 Cherylann Ratel A, DO   6 each at 12/03/20 0850  . furosemide (LASIX) injection 40 mg  40 mg Intravenous Daily Kyle, Tyrone A, DO      . insulin aspart (novoLOG) injection 0-6 Units  0-6 Units Subcutaneous Q4H [name] Dee, MD   6 Units at 12/03/20 0840  . levalbuterol (XOPENEX) nebulizer solution 0.63 mg  0.63 mg Nebulization Q8H PRN Lovey Newcomer T, NP   0.63 mg at 12/03/20 0851  . levothyroxine (SYNTHROID, LEVOTHROID) injection 25 mcg  25 mcg Intravenous Daily Marylyn Ishihara, Tyrone A, DO      . mupirocin ointment (BACTROBAN) 2 % 1 application  1 application Nasal BID Marylyn Ishihara, Tyrone A, DO   1 application at [id number]    VITAL SIGNS: BP (!) 142/82 (BP Location: Right Arm)   Pulse 80   Temp 97.6 F (36.4 C) (Oral)   Resp 18   Ht '5\' 9"'$  (1.753 m)   Wt 75.1 kg   SpO2 93%   BMI 24.45 kg/m  Filed Weights   12/02/20 2300 12/03/20 0517  Weight: 75 kg 75.1 kg    Estimated body mass index is 24.45 kg/m as calculated from the following:   Height  as of this encounter: '5\' 9"'$  (1.753 m).   Weight as of this encounter: 75.1 kg.  LABS: CBC:    Component Value Date/Time   WBC 5.5 12/03/2020 0412   HGB 8.2 (L) 12/03/2020 0412   HGB 10.6 (L) 03/16/2019 1021   HCT 26.0 (L) 12/03/2020 0412   HCT 33.1 (L) 03/16/2019 1021   PLT 192 12/03/2020 0412   PLT 121 (L) 03/16/2019 1021   Comprehensive Metabolic Panel:    Component Value Date/Time   NA 140 12/03/2020 0412   NA 131 (L) 03/16/2019 1021   K 3.9 12/03/2020 0412   BUN 84 (H) 12/03/2020 0412   BUN 27 03/16/2019 1021   CREATININE 6.50 (H) 12/03/2020 0412   ALBUMIN 2.9 (L) 12/03/2020 0412   ALBUMIN 4.3 03/16/2019 1021     Review of Systems  Unable to perform ROS: Dementia   Prognosis: POOR  Discharge Planning:  Home with [name] (established with  [name] [name])  Recommendations: . DNR/DNI-as confirmed by sisters . Continue with current plan of care, treat the treatable, no aggressive interventions/escalation of care . Discussed at length and education provided on end-of-life, goals of comfort in the home, elimination of re-hospitalizations, symptom management.  . Family open for medications to be administered during evening/bedtime hours and during the day if threatening behaviors/increased agitation. If patient continues to be agitated would recommend Haldol as needed.  . Patient is currently under [name] services via [name]. Family wishes to continue with their support at discharge.  Marland Kitchen PMT will continue to support and follow. Please call team line with urgent needs.   Palliative Performance Scale: PPS 20%              Family expressed understanding and was in agreement with this plan.   Thank you for allowing the Palliative Medicine Team to assist in the care of this patient.  Time In: 1030 Time Out: 1120 Time Total: 50 min.   Visit consisted of counseling and education dealing with the complex and emotionally intense issues of symptom management and palliative care in the setting of serious and potentially life-threatening illness.Greater than 50%  of this time was spent counseling and coordinating care related to the above assessment and plan.  Signed by:  Alda Lea, AGPCNP-BC Palliative Medicine Team  Phone: [phone] Pager: [phone] Amion: Bjorn Pippin

## 2020-12-03 NOTE — Plan of Care (Signed)
  Problem: Education: Goal: Knowledge of General Education information will improve Description: Including pain rating scale, medication(s)/side effects and non-pharmacologic comfort measures Outcome: Progressing   Problem: Clinical Measurements: Goal: Ability to maintain clinical measurements within normal limits will improve Outcome: Progressing Goal: Will remain free from infection Outcome: Progressing Goal: Diagnostic test results will improve Outcome: Progressing Goal: Respiratory complications will improve Outcome: Progressing   Problem: Activity: Goal: Risk for activity intolerance will decrease Outcome: Progressing   Problem: Nutrition: Goal: Adequate nutrition will be maintained Outcome: Progressing

## 2020-12-03 NOTE — Progress Notes (Signed)
Pt still attempting to get out of bed.  Legs over the bed rail.  Ativan was not effective.  Attempts at reorientation has not worked.  Will continue to reorient pt.

## 2020-12-03 NOTE — Assessment & Plan Note (Signed)
-   replete and recheck as needed 

## 2020-12-03 NOTE — Progress Notes (Signed)
Went into room bed alarm is going off.  Pt is sitting up in bed but then lays back down.  Pt taking legs to put over the siderail.  Pt legs put back in bed.  IV in [place] with opsite pulled up d/t pt attempting to remove IV.  This was not successful; however another opsite put over IV and extremity wrapped in kerlix.

## 2020-12-03 NOTE — Assessment & Plan Note (Addendum)
-   suspect inappropriate administration of synthroid at home and patient likely not taking it daily due to his progressive functional decline; also can be elevated in setting of acute illness/functional decline - reasonable to continue IV synthroid for now and transition to oral in 2-3 days - TSH 14.363 - follow up FT4 (0.85, normal) and TT3

## 2020-12-03 NOTE — Assessment & Plan Note (Signed)
-   considered ACD - baseline 8-10 g/dL. Currently at baseline

## 2020-12-04 ENCOUNTER — Inpatient Hospital Stay (HOSPITAL_COMMUNITY)

## 2020-12-04 DIAGNOSIS — J9601 Acute respiratory failure with hypoxia: Secondary | ICD-10-CM | POA: Diagnosis not present

## 2020-12-04 DIAGNOSIS — E1122 Type 2 diabetes mellitus with diabetic chronic kidney disease: Secondary | ICD-10-CM

## 2020-12-04 DIAGNOSIS — R079 Chest pain, unspecified: Secondary | ICD-10-CM

## 2020-12-04 DIAGNOSIS — E039 Hypothyroidism, unspecified: Principal | ICD-10-CM

## 2020-12-04 DIAGNOSIS — J189 Pneumonia, unspecified organism: Secondary | ICD-10-CM

## 2020-12-04 DIAGNOSIS — N185 Chronic kidney disease, stage 5: Secondary | ICD-10-CM

## 2020-12-04 LAB — CBC WITH DIFFERENTIAL/PLATELET
Abs Immature Granulocytes: 0.02 10*3/uL (ref 0.00–0.07)
Basophils Absolute: 0 10*3/uL (ref 0.0–0.1)
Basophils Relative: 1 %
Eosinophils Absolute: 0.2 10*3/uL (ref 0.0–0.5)
Eosinophils Relative: 3 %
HCT: 26.6 % — ABNORMAL LOW (ref 39.0–52.0)
Hemoglobin: 8.2 g/dL — ABNORMAL LOW (ref 13.0–17.0)
Immature Granulocytes: 0 %
Lymphocytes Relative: 13 %
Lymphs Abs: 0.7 10*3/uL (ref 0.7–4.0)
MCH: 30.6 pg (ref 26.0–34.0)
MCHC: 30.8 g/dL (ref 30.0–36.0)
MCV: 99.3 fL (ref 80.0–100.0)
Monocytes Absolute: 0.7 10*3/uL (ref 0.1–1.0)
Monocytes Relative: 13 %
Neutro Abs: 3.9 10*3/uL (ref 1.7–7.7)
Neutrophils Relative %: 70 %
Platelets: 176 10*3/uL (ref 150–400)
RBC: 2.68 MIL/uL — ABNORMAL LOW (ref 4.22–5.81)
RDW: 14.7 % (ref 11.5–15.5)
WBC: 5.6 10*3/uL (ref 4.0–10.5)
nRBC: 0 % (ref 0.0–0.2)

## 2020-12-04 LAB — GLUCOSE, CAPILLARY
Glucose-Capillary: 142 mg/dL — ABNORMAL HIGH (ref 70–99)
Glucose-Capillary: 228 mg/dL — ABNORMAL HIGH (ref 70–99)
Glucose-Capillary: 193 mg/dL — ABNORMAL HIGH (ref 70–99)
Glucose-Capillary: 139 mg/dL — ABNORMAL HIGH (ref 70–99)
Glucose-Capillary: 134 mg/dL — ABNORMAL HIGH (ref 70–99)

## 2020-12-04 LAB — BASIC METABOLIC PANEL
Anion gap: 21 — ABNORMAL HIGH (ref 5–15)
BUN: 83 mg/dL — ABNORMAL HIGH (ref 8–23)
CO2: 19 mmol/L — ABNORMAL LOW (ref 22–32)
Calcium: 8.3 mg/dL — ABNORMAL LOW (ref 8.9–10.3)
Chloride: 105 mmol/L (ref 98–111)
Creatinine, Ser: 6.59 mg/dL — ABNORMAL HIGH (ref 0.61–1.24)
GFR, Estimated: 8 mL/min — ABNORMAL LOW (ref >60)
Glucose, Bld: 167 mg/dL — ABNORMAL HIGH (ref 70–99)
Potassium: 3.7 mmol/L (ref 3.5–5.1)
Sodium: 145 mmol/L (ref 135–145)

## 2020-12-04 LAB — URINE CULTURE: Culture: NO GROWTH

## 2020-12-04 LAB — ECHOCARDIOGRAM COMPLETE
Area-P 1/2: 5.23 cm2
Calc EF: 42.3 %
Height: 69 in
S' Lateral: 4.3 cm
Single Plane A2C EF: 43.9 %
Single Plane A4C EF: 38.3 %
Weight: 2543.23 oz

## 2020-12-04 LAB — T3: T3, Total: 53 ng/dL — ABNORMAL LOW (ref 71–180)

## 2020-12-04 LAB — MAGNESIUM: Magnesium: 2.5 mg/dL — ABNORMAL HIGH (ref 1.7–2.4)

## 2020-12-04 MED ORDER — FUROSEMIDE 10 MG/ML IJ SOLN
40.0000 mg | Freq: Two times a day (BID) | INTRAMUSCULAR | Status: DC
  Administered 2020-12-04 – 2020-12-06 (×3): 40 mg via INTRAVENOUS
  Filled 2020-12-04 (×5): qty 4

## 2020-12-04 MED ORDER — IPRATROPIUM-ALBUTEROL 0.5-2.5 (3) MG/3ML IN SOLN
3.0000 mL | Freq: Three times a day (TID) | RESPIRATORY_TRACT | Status: DC
  Administered 2020-12-05 – 2020-12-06 (×5): 3 mL via RESPIRATORY_TRACT
  Filled 2020-12-04 (×5): qty 3

## 2020-12-04 MED ORDER — IPRATROPIUM-ALBUTEROL 0.5-2.5 (3) MG/3ML IN SOLN
3.0000 mL | Freq: Four times a day (QID) | RESPIRATORY_TRACT | Status: DC
  Administered 2020-12-04: 3 mL via RESPIRATORY_TRACT
  Filled 2020-12-04 (×2): qty 3

## 2020-12-04 MED ORDER — IPRATROPIUM-ALBUTEROL 0.5-2.5 (3) MG/3ML IN SOLN
3.0000 mL | Freq: Once | RESPIRATORY_TRACT | Status: AC
  Administered 2020-12-04: 3 mL via RESPIRATORY_TRACT
  Filled 2020-12-04: qty 3

## 2020-12-04 NOTE — Evaluation (Signed)
Physical Therapy Evaluation Patient Details Name: [name] [name] MRN: [id number] DOB: 01-Jun-1946 Today's Date: 12/04/2020   History of Present Illness  75 yo male admitted with hypothyroidism, falls at home. Hx of dementia, DM, CKD, CVA, retroperitoneal hematoma  Clinical Impression  On eval, pt required Max Assist for mobility. Deferred attempt at standing 2* did not have +2 available. Pt was agreeable and participated as best he could. No family present during session. Unsure of pt's PLOF. Per chart, he was home on hospice. Recommend resume hospice services.     Follow Up Recommendations Supervision/Assistance - 24 hour (return home with hospice)    Equipment Recommendations  Wheelchair cushion (measurements PT);Wheelchair (measurements PT);Hospital bed    Recommendations for Other Services       Precautions / Restrictions Precautions Precautions: Fall Restrictions Weight Bearing Restrictions: No      Mobility  Bed Mobility Overal bed mobility: Needs Assistance Bed Mobility: Sit to Supine       Sit to supine: HOB elevated;Max assist   General bed mobility comments: Pt sitting EOB with waist restraint keeping him from going further. NT stated pt repeatedly gets to that point himself (he will not stay in bed). Assist for bil LEs onto bed.    Transfers Overall transfer level: Needs assistance   Transfers: Lateral/Scoot Transfers          Lateral/Scoot Transfers: Max assist General transfer comment: Max assist to scoot towards [place]. Utilized bedpad to aid scooting. Increased time. Cues required.  Ambulation/Gait                Stairs            Wheelchair Mobility    Modified Rankin (Stroke Patients Only)       Balance Overall balance assessment: Needs assistance;History of Falls Sitting-balance support: Bilateral upper extremity supported;Feet supported Sitting balance-Leahy Scale: Fair                                        Pertinent Vitals/Pain Pain Assessment: Faces Faces Pain Scale: No hurt    Home Living Family/patient expects to be discharged to:: Private residence Living Arrangements: Other relatives Available Help at Discharge: Family Type of Home: House         Home Equipment: Gil[place] Rile - 2 wheels Additional Comments: Per chart review, pt has daughter names [name] and is a hospice patient. Unsure of home information as pt unable to report.    Prior Function Level of Independence: Needs assistance         Comments: Unsure of baseline given cognitive deficits.  Per chart has had multiple falls.     Hand Dominance        Extremity/Trunk Assessment   Upper Extremity Assessment Upper Extremity Assessment: Generalized weakness    Lower Extremity Assessment Lower Extremity Assessment: Generalized weakness    Cervical / Trunk Assessment Cervical / Trunk Assessment: Kyphotic  Communication   Communication: HOH  Cognition Arousal/Alertness: Awake/alert Behavior During Therapy: WFL for tasks assessed/performed Overall Cognitive Status: No family/caregiver present to determine baseline cognitive functioning Area of Impairment: Safety/judgement                         Safety/Judgement: Decreased awareness of safety;Decreased awareness of deficits     General Comments: Dementia at baseline. Slow processing and difficulty sequencing.      General Comments  Exercises     Assessment/Plan    PT Assessment Patient needs continued PT services  PT Problem List Decreased strength;Decreased balance;Decreased mobility;Decreased activity tolerance;Decreased cognition;Decreased knowledge of use of DME;Decreased safety awareness;Decreased knowledge of precautions;Pain       PT Treatment Interventions DME instruction;Gait training;Functional mobility training;Therapeutic activities;Therapeutic exercise;Balance training;Patient/family education;Cognitive remediation    PT  Goals (Current goals can be found in the Care Plan section)  Acute Rehab PT Goals Patient Stated Goal: Agreeable PT Goal Formulation: Patient unable to participate in goal setting Time For Goal Achievement: 12/18/20 Potential to Achieve Goals: Fair    Frequency Min 2X/week   Barriers to discharge        Co-evaluation               AM-PAC PT "6 Clicks" Mobility  Outcome Measure Help needed turning from your back to your side while in a flat bed without using bedrails?: A Lot Help needed moving from lying on your back to sitting on the side of a flat bed without using bedrails?: A Lot Help needed moving to and from a bed to a chair (including a wheelchair)?: A Lot Help needed standing up from a chair using your arms (e.g., wheelchair or bedside chair)?: A Lot Help needed to walk in hospital room?: Total Help needed climbing 3-5 steps with a railing? : Total 6 Click Score: 10    End of Session   Activity Tolerance: Patient tolerated treatment well Patient left: in bed;with call bell/phone within reach;with bed alarm set;with restraints reapplied   PT Visit Diagnosis: Muscle weakness (generalized) (M62.81);Difficulty in walking, not elsewhere classified (R26.2);History of falling (Z91.81)    Time: [id number] PT Time Calculation (min) (ACUTE ONLY): 12 min   Charges:   PT Evaluation $PT Eval Moderate Complexity: 1 Mod            Doreatha Massed, PT Acute Rehabilitation  Office: [phone] Pager: [phone]

## 2020-12-04 NOTE — Progress Notes (Signed)
  Echocardiogram 2D Echocardiogram has been performed.  [name] [name] 12/04/2020, 8:37 AM

## 2020-12-04 NOTE — Evaluation (Signed)
BSE completed, Full report to follow.  Pt edentulous and with RR of 24 with retraction = appears dyspneic but says "I'm ok" when asked.   No overt indication of aspiration but swallow is delayed.    Prior MBS completed, in 2018 showing delayed swallow.  Recommend consider full liquid diet with strict precautions.    Kathleen Lime, MS [facility] SLP Acute Rehab Services Office [phone] Pager [phone]

## 2020-12-04 NOTE — Evaluation (Signed)
Clinical/Bedside Swallow Evaluation Patient Details  Name: [name] [name] MRN: [id number] Date of Birth: [birth year]  Today's Date: 12/04/2020 Time: SLP Start Time (ACUTE ONLY): 1332 SLP Stop Time (ACUTE ONLY): 1425 SLP Time Calculation (min) (ACUTE ONLY): 53 min  Past Medical History:  Past Medical History:  Diagnosis Date  . 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 Closed comminuted intertrochanteric fracture of left femur ([place])   . Diabetes mellitus without complication ([place])   . Diabetic hyperosmolar non-ketotic state ([place]) 05/15/2016  . DKA (diabetic ketoacidoses) 05/09/2016  . Hypertension   . Hypoglycemia 11/29/2019  . Postoperative anemia due to acute blood loss 06/18/2016  . Retroperitoneal hematoma 06/18/2016  . Stroke ([place])   . Vitamin B 12 deficiency 06/18/2016   Past Surgical History:  Past Surgical History:  Procedure Laterality Date  . HIP ARTHROPLASTY Right 02/05/2013   Procedure: ARTHROPLASTY BIPOLAR HIP;  Surgeon: [name], MD;  Location: [facility];  Service: Orthopedics;  Laterality: Right;  . INTRAMEDULLARY (IM) NAIL INTERTROCHANTERIC Left 06/16/2016   Procedure: INTRAMEDULLARY (IM) NAIL INTERTROCHANTRIC;  Surgeon: Rod Can, MD;  Location: [facility];  Service: Orthopedics;  Laterality: Left;   HPI:  75 yo male adm to [facility] with hypothermia, hypothyroid and possible pna.  He is being treated with ABX for PNA.  Pt with PMH + for DM2, multiple CVAs.  Per notes, pt was receiving hospice at home prior to admission.  Pt denies h/o CVAs to this SLP but he is significantly dysarthric. Use of context cues and direct questions cues helpful with communication. Pt repeatedly stated "thank you" during SLP session.    He denies being hungry or thirsty however. Pt is receiving nebulizer treatments and Lasix.   Assessment / Plan / Recommendation Clinical Impression  Patient presents with clinical indications concerning for oropharyngeal  dysphagia.  He is edentulous and severely dysarthric.  RR of 24 with chest retraction noted = appears dyspneic but says "I'm ok" when asked.   No overt indication of aspiration but swallow is delayed across all consistencies.   Pt only consumed small amount of water, nectar thick juice, applesauce and soft cracker.  Prior MBS completed, in 2018 showing delayed swallow.  Recommend full liquid diet initially with strict precautions as this will allow pt his Icecream that he enjoys.  His RR with increased retraction of chest with intake will increase risk of aspiration due to impairing reciprocity of swallow/respirations. Pt's impaired attention, pushing back table during session, trying to get OOB, etc will be compensated for more easily with a full liquid diet.  Will follow up for tolerance, readiness for dietary advancement. Note pt is followed by hospice PTA - with no plans for PEG tubes, excessive interventions.       Messaged palliative NP with recommendations and approval for diet order received. Thanks. SLP Visit Diagnosis: Dysphagia, oropharyngeal phase (R13.12);Dysphagia, unspecified (R13.10)    Aspiration Risk  Moderate aspiration risk    Diet Recommendation Thin liquid (full liquids)   Liquid Administration via: Cup;Straw Medication Administration: Crushed with puree Supervision: Full supervision/cueing for compensatory strategies Compensations: Minimize environmental distractions;Slow rate;Small sips/bites Postural Changes: Seated upright at 90 degrees;Remain upright for at least 30 minutes after po intake    Other  Recommendations Oral Care Recommendations: Oral care BID Other Recommendations: Have oral suction available   Follow up Recommendations None      Frequency and Duration min 1 x/week  1 week  Prognosis Prognosis for Safe Diet Advancement: Fair Barriers to Reach Goals: Cognitive deficits;Time post onset      Swallow Study   General Date of Onset: 12/04/20 HPI:  75 yo male adm to [facility] with hypothermia, hypothyroid and possible pna.  He is being treated with ABX for PNA.  Pt with PMH + for DM2, multiple CVAs.  Per notes, pt was receiving hospice at home prior to admission.  Pt denies h/o CVAs to this SLP but he is significantly dysarthric. Use of context cues and direct questions cues helpful with communication. Pt repeatedly stated "thank you" during SLP session.    He denies being hungry or thirsty however. Pt is receiving nebulizer treatments and Lasix. Type of Study: Bedside Swallow Evaluation Previous Swallow Assessment: repeated prior evaluations Diet Prior to this Study: NPO Temperature Spikes Noted: No Respiratory Status: Nasal cannula (2 liters) History of Recent Intubation: No Behavior/Cognition: Alert;Cooperative;Pleasant mood Oral Cavity Assessment: Dry Oral Care Completed by SLP: No Oral Cavity - Dentition: Edentulous Vision: Functional for self-feeding Self-Feeding Abilities: Needs assist Patient Positioning: Upright in bed Baseline Vocal Quality: Low vocal intensity Volitional Cough: Weak Volitional Swallow: Unable to elicit    Oral/Motor/Sensory Function Overall Oral Motor/Sensory Function: Generalized oral weakness   Ice Chips Ice chips: Not tested   Thin Liquid Thin Liquid: Impaired Presentation: Cup;Straw Pharyngeal  Phase Impairments: Suspected delayed Swallow    Nectar Thick Nectar Thick Liquid: Impaired Presentation: Cup;Straw;Self Fed Pharyngeal Phase Impairments: Suspected delayed Swallow   Honey Thick Honey Thick Liquid: Not tested   Puree Puree: Impaired Presentation: Spoon Pharyngeal Phase Impairments: Suspected delayed Swallow   Solid     Solid: Impaired Oral Phase Impairments: Impaired mastication Pharyngeal Phase Impairments: Suspected delayed Swallow      Macario Golds 12/04/2020,7:54 PM  Kathleen Lime, MS [facility] SLP Acute Rehab Services Office [phone] Pager [phone]

## 2020-12-04 NOTE — Progress Notes (Signed)
[facility] hospice patient note.  [name] [name] is a current hospice patient with a terminal diagnosis of ESRD. Family activated EMS after patient started experiencing increased SOB. Hospice was not notified prior to transfer to ED.  Patient was admitted to [facility] on 12/02/20 with a diagnosis of hypoxia and hypothermia. Per Dr. Orpah Melter with [facility] this is a related hospital admission.   Visited patient at bedside, not family present.  Patient is awake, alert and oriented to person. Speech is slurred. No apparent distress.  Patient continues to state he wants to go back home.   VS: 97.7, 168/90, 89, 20, 96% 2Lnc I/O: 100/600  Abn Labs:  12/04/2020 04:32 CO2: 19 (L) Glucose: 167 (H) BUN: 83 (H) Creatinine: 6.59 (H) Calcium: 8.3 (L) Anion gap: 21 (H) Magnesium: 2.5 (H) GFR, Estimated: 8 (L) RBC: 2.68 (L) Hemoglobin: 8.2 (L) HCT: 26.6 (L)  IV and PRN medications Maxipime 1g IVPB QD no PRNs today  Problem list Acute respiratory failure with hypoxia  Hypothermia-resolved CAP Hypokalemia  Normocytic Anemia  Diabetes Type 2 CKD stage V Acute metabolic encephalopathy-resolved as of 12/03/2020  Discharge planning: Return home with hospice Family Contact: Spoke with sister [name] IDG: Updated  Goals of care: Clear. DNR  Please use [facility] for ambulance transport at discharge.  A Please do not hesitate to call with questions.    Thank you,   Farrel Gordon, RN, [place] (listed on [facility] under [place])    [phone]

## 2020-12-04 NOTE — Progress Notes (Signed)
PROGRESS NOTE    [name]   VVO:[id number]  DOB: [birth year]  DOA: 12/02/2020     2  PCP: Libby Maw, MD  CC: AMS  Hospital Course: [name] [name] is a 75 yo male with PMH DMII, CVA (residual cognitive impairment), HTN, CKDV who presented with worsening mentation.  He has been falling out of bed recently more as well.  EMS made multiple visits to the house however due to his worsening confusion and some wheezing/cough he was brought to the ER for further evaluation. He was recently hospitalized for similar from 11/25/2020 until 11/28/2020 for hypothermia and volume overload.  He was treated for possible pneumonia as well and discharged on antibiotics to complete course. He was initiated on IV Synthroid on admission due to ongoing elevated TSH with concern for improper administration of Synthroid at home as well as possible missed doses. Family confirmed that hospice is on board at home and patient is DNR/DNI however they wish to treat what is able to be "treated".   Interval History:  Restless in bed, noted to be wheezing with some increased work of breathing.  Still with garbled speech, unable to clearly understand what patient is saying.   Assessment & Plan:  Hypothyroidism Suspect inappropriate administration of synthroid at home and patient likely not taking it daily due to his progressive functional decline; also can be elevated in setting of acute illness/functional decline Continue IV synthroid for now and transition to oral in 2-3 days TSH 14.36, FT4 (0.85, normal) and TT3  Goals of care, counseling/discussion Followed by hospice outpatient Treating reversible medical causes as able  Acute respiratory failure with hypoxia Possible CAP Currently on 2 L of O2 Etiology considered multifactorial in setting of underlying volume overload with possible superimposed infiltrates concerning for infection CXR which may be consistent with pneumonia versus edema Continue  cefepime, scheduled DuoNeb Continue oxygen, wean as able Continue Lasix, increased to 63m IV BID  Type 2 diabetes mellitus  Continue SSI and CBG monitoring   CKD stage V Baseline creat ~ 6.5 - 7.5, eGFR 8, currently at baseline Not a dialysis candidate and followed by hospice at home  Anemia of CKD Baseline 8-10 g/dL. Currently at baseline  Acute metabolic encephalopathy More alert, but still intermittently confused Monitor closely, waist restraints ordered    Antimicrobials: Cefepime 2/20 >> current  DVT prophylaxis: SCDs Start: 12/02/20 1925   Code Status:   Code Status: DNR Family Communication: None at bedside  Disposition Plan: Status is: Inpatient  Remains inpatient appropriate because:IV treatments appropriate due to intensity of illness or inability to take PO and Inpatient level of care appropriate due to severity of illness   Dispo: The patient is from: Home              Anticipated d/c is to: Home              Anticipated d/c date is: 2 days              Patient currently is not medically stable to d/c.   Difficult to place patient No   Objective: Blood pressure (!) 168/90, pulse 89, temperature 97.7 F (36.5 C), temperature source Oral, resp. rate 20, height '5\' 9"'  (1.753 m), weight 72.1 kg, SpO2 96 %.  Examination:  General:  Chronically ill-appearing, noted garbled speech, restless in bed  Cardiovascular: S1, S2 present  Respiratory:  Diminished breath sounds bilaterally, with audible wheezing noted  Abdomen: Soft, nontender, nondistended, bowel sounds present  Musculoskeletal: 1+ bilateral pedal edema noted  Skin: Noted mark scratches on BLE  Psychiatry:  Unable to assess    Consultants:   None  Procedures:   None  Data Reviewed: I have personally reviewed following labs and imaging studies Results for orders placed or performed during the hospital encounter of 12/02/20 (from the past 24 hour(s))  Glucose, capillary      Status: None   Collection Time: 12/03/20  8:03 PM  Result Value Ref Range   Glucose-Capillary 75 70 - 99 mg/dL  Glucose, capillary     Status: None   Collection Time: 12/03/20 11:39 PM  Result Value Ref Range   Glucose-Capillary 77 70 - 99 mg/dL  Glucose, capillary     Status: Abnormal   Collection Time: 12/04/20  4:05 AM  Result Value Ref Range   Glucose-Capillary 142 (H) 70 - 99 mg/dL  Basic metabolic panel     Status: Abnormal   Collection Time: 12/04/20  4:32 AM  Result Value Ref Range   Sodium 145 135 - 145 mmol/L   Potassium 3.7 3.5 - 5.1 mmol/L   Chloride 105 98 - 111 mmol/L   CO2 19 (L) 22 - 32 mmol/L   Glucose, Bld 167 (H) 70 - 99 mg/dL   BUN 83 (H) 8 - 23 mg/dL   Creatinine, Ser 6.59 (H) 0.61 - 1.24 mg/dL   Calcium 8.3 (L) 8.9 - 10.3 mg/dL   GFR, Estimated 8 (L) >60 mL/min   Anion gap 21 (H) 5 - 15  CBC with Differential/Platelet     Status: Abnormal   Collection Time: 12/04/20  4:32 AM  Result Value Ref Range   WBC 5.6 4.0 - 10.5 K/uL   RBC 2.68 (L) 4.22 - 5.81 MIL/uL   Hemoglobin 8.2 (L) 13.0 - 17.0 g/dL   HCT 26.6 (L) 39.0 - 52.0 %   MCV 99.3 80.0 - 100.0 fL   MCH 30.6 26.0 - 34.0 pg   MCHC 30.8 30.0 - 36.0 g/dL   RDW 14.7 11.5 - 15.5 %   Platelets 176 150 - 400 K/uL   nRBC 0.0 0.0 - 0.2 %   Neutrophils Relative % 70 %   Neutro Abs 3.9 1.7 - 7.7 K/uL   Lymphocytes Relative 13 %   Lymphs Abs 0.7 0.7 - 4.0 K/uL   Monocytes Relative 13 %   Monocytes Absolute 0.7 0.1 - 1.0 K/uL   Eosinophils Relative 3 %   Eosinophils Absolute 0.2 0.0 - 0.5 K/uL   Basophils Relative 1 %   Basophils Absolute 0.0 0.0 - 0.1 K/uL   Immature Granulocytes 0 %   Abs Immature Granulocytes 0.02 0.00 - 0.07 K/uL  Magnesium     Status: Abnormal   Collection Time: 12/04/20  4:32 AM  Result Value Ref Range   Magnesium 2.5 (H) 1.7 - 2.4 mg/dL  Glucose, capillary     Status: Abnormal   Collection Time: 12/04/20  7:22 AM  Result Value Ref Range   Glucose-Capillary 228 (H) 70 - 99  mg/dL  Glucose, capillary     Status: Abnormal   Collection Time: 12/04/20 11:55 AM  Result Value Ref Range   Glucose-Capillary 193 (H) 70 - 99 mg/dL  Glucose, capillary     Status: Abnormal   Collection Time: 12/04/20  3:57 PM  Result Value Ref Range   Glucose-Capillary 139 (H) 70 - 99 mg/dL    Recent Results (from the past 240 hour(s))  Resp Panel by RT-PCR (Flu A&B,  Covid) Nasopharyngeal Swab     Status: None   Collection Time: 11/25/20  6:55 PM   Specimen: Nasopharyngeal Swab; Nasopharyngeal(NP) swabs in vial transport medium  Result Value Ref Range Status   SARS Coronavirus 2 by RT PCR NEGATIVE NEGATIVE Final    Comment: (NOTE) SARS-CoV-2 target nucleic acids are NOT DETECTED.  The SARS-CoV-2 RNA is generally detectable in upper respiratory specimens during the acute phase of infection. The lowest concentration of SARS-CoV-2 viral copies this assay can detect is 138 copies/mL. A negative result does not preclude SARS-Cov-2 infection and should not be used as the sole basis for treatment or other patient management decisions. A negative result may occur with  improper specimen collection/handling, submission of specimen other than nasopharyngeal swab, presence of viral mutation(s) within the areas targeted by this assay, and inadequate number of viral copies(<138 copies/mL). A negative result must be combined with clinical observations, patient history, and epidemiological information. The expected result is Negative.  Fact Sheet for Patients:  EntrepreneurPulse.com.au  Fact Sheet for Healthcare Providers:  IncredibleEmployment.be  This test is no t yet approved or cleared by the [place] FDA and  has been authorized for detection and/or diagnosis of SARS-CoV-2 by FDA under an Emergency Use Authorization (EUA). This EUA will remain  in effect (meaning this test can be used) for the duration of the COVID-19 declaration under Section  564(b)(1) of the Act, 21 U.S.C.section 360bbb-3(b)(1), unless the authorization is terminated  or revoked sooner.       Influenza A by PCR NEGATIVE NEGATIVE Final   Influenza B by PCR NEGATIVE NEGATIVE Final    Comment: (NOTE) The Xpert Xpress SARS-CoV-2/FLU/RSV plus assay is intended as an aid in the diagnosis of influenza from Nasopharyngeal swab specimens and should not be used as a sole basis for treatment. Nasal washings and aspirates are unacceptable for Xpert Xpress SARS-CoV-2/FLU/RSV testing.  Fact Sheet for Patients: EntrepreneurPulse.com.au  Fact Sheet for Healthcare Providers: IncredibleEmployment.be  This test is not yet approved or cleared by the [place] FDA and has been authorized for detection and/or diagnosis of SARS-CoV-2 by FDA under an Emergency Use Authorization (EUA). This EUA will remain in effect (meaning this test can be used) for the duration of the COVID-19 declaration under Section 564(b)(1) of the Act, 21 U.S.C. section 360bbb-3(b)(1), unless the authorization is terminated or revoked.  Performed at [facility], [place] [address] [place], [place] [address]   Culture, blood (single)     Status: None   Collection Time: 11/25/20  7:24 PM   Specimen: BLOOD  Result Value Ref Range Status   Specimen Description BLOOD RIGHT HAND  Final   Special Requests   Final    BOTTLES DRAWN AEROBIC AND ANAEROBIC Blood Culture adequate volume   Culture   Final    NO GROWTH 5 DAYS Performed at [facility], [address], [place], [place] [address]    Report Status 11/30/2020 FINAL  Final  Resp Panel by RT-PCR (Flu A&B, Covid) Nasopharyngeal Swab     Status: None   Collection Time: 12/02/20 12:27 PM   Specimen: Nasopharyngeal Swab; Nasopharyngeal(NP) swabs in vial transport medium  Result Value Ref Range Status   SARS Coronavirus 2 by RT PCR NEGATIVE NEGATIVE Final    Comment: (NOTE) SARS-CoV-2 target  nucleic acids are NOT DETECTED.  The SARS-CoV-2 RNA is generally detectable in upper respiratory specimens during the acute phase of infection. The lowest concentration of SARS-CoV-2 viral copies this assay  can detect is 138 copies/mL. A negative result does not preclude SARS-Cov-2 infection and should not be used as the sole basis for treatment or other patient management decisions. A negative result may occur with  improper specimen collection/handling, submission of specimen other than nasopharyngeal swab, presence of viral mutation(s) within the areas targeted by this assay, and inadequate number of viral copies(<138 copies/mL). A negative result must be combined with clinical observations, patient history, and epidemiological information. The expected result is Negative.  Fact Sheet for Patients:  EntrepreneurPulse.com.au  Fact Sheet for Healthcare Providers:  IncredibleEmployment.be  This test is no t yet approved or cleared by the [place] FDA and  has been authorized for detection and/or diagnosis of SARS-CoV-2 by FDA under an Emergency Use Authorization (EUA). This EUA will remain  in effect (meaning this test can be used) for the duration of the COVID-19 declaration under Section 564(b)(1) of the Act, 21 U.S.C.section 360bbb-3(b)(1), unless the authorization is terminated  or revoked sooner.       Influenza A by PCR NEGATIVE NEGATIVE Final   Influenza B by PCR NEGATIVE NEGATIVE Final    Comment: (NOTE) The Xpert Xpress SARS-CoV-2/FLU/RSV plus assay is intended as an aid in the diagnosis of influenza from Nasopharyngeal swab specimens and should not be used as a sole basis for treatment. Nasal washings and aspirates are unacceptable for Xpert Xpress SARS-CoV-2/FLU/RSV testing.  Fact Sheet for Patients: EntrepreneurPulse.com.au  Fact Sheet for Healthcare  Providers: IncredibleEmployment.be  This test is not yet approved or cleared by the [place] FDA and has been authorized for detection and/or diagnosis of SARS-CoV-2 by FDA under an Emergency Use Authorization (EUA). This EUA will remain in effect (meaning this test can be used) for the duration of the COVID-19 declaration under Section 564(b)(1) of the Act, 21 U.S.C. section 360bbb-3(b)(1), unless the authorization is terminated or revoked.  Performed at [facility], [place] [address], [place], [place] [address]   Blood Culture (routine x 2)     Status: None (Preliminary result)   Collection Time: 12/02/20 12:27 PM   Specimen: BLOOD  Result Value Ref Range Status   Specimen Description   Final    BLOOD RIGHT ANTECUBITAL Performed at [facility] [address] [place], [place] [address]    Special Requests   Final    BOTTLES DRAWN AEROBIC AND ANAEROBIC Blood Culture adequate volume Performed at [place] [facility] [address] [place], [place] [address]    Culture   Final    NO GROWTH 2 DAYS Performed at [facility] [address] [place], [place] [address]    Report Status PENDING  Incomplete  Blood Culture (routine x 2)     Status: None (Preliminary result)   Collection Time: 12/02/20 12:32 PM   Specimen: BLOOD  Result Value Ref Range Status   Specimen Description   Final    BLOOD LEFT ANTECUBITAL Performed at [facility] [address], [place], [place] [address]    Special Requests   Final    BOTTLES DRAWN AEROBIC AND ANAEROBIC Blood Culture adequate volume Performed at [facility] [address], [place], [place] [address]    Culture   Final    NO GROWTH 2 DAYS Performed at [facility] [address] [place], [place] [address]    Report Status PENDING  Incomplete  Urine culture     Status: None   Collection Time: 12/02/20  4:30  PM   Specimen: In/Out Cath Urine  Result Value Ref Range Status   Specimen Description   Final    IN/OUT CATH URINE Performed at [facility], [place] [address], [place], [place] [address]    Special Requests   Final    NONE Performed at [facility], [place] [address], [place], [place] [address]    Culture   Final    NO GROWTH Performed at [facility], [place] [address], [place], [place] [address]    Report Status 12/04/2020 FINAL  Final  MRSA PCR Screening     Status: Abnormal   Collection Time: 12/02/20  7:25 PM   Specimen: Nasopharyngeal  Result Value Ref Range Status   MRSA by PCR POSITIVE (A) NEGATIVE Final    Comment:        The GeneXpert MRSA Assay (FDA approved for NASAL specimens only), is one component of a comprehensive MRSA colonization surveillance program. It is not intended to diagnose MRSA infection nor to guide or monitor treatment for MRSA infections. RESULT CALLED TO, READ BACK BY AND VERIFIED WITH: Gunnar Bulla RN 12/02/20 '@2317'  BY P.HENDERSON Performed at [facility] [address] [place],  [address]      Radiology Studies: ECHOCARDIOGRAM COMPLETE  Result Date: 12/04/2020    ECHOCARDIOGRAM REPORT   Patient Name:   [name] [name] Date of Exam: 12/04/2020 Medical Rec #:  [id number]   Height:       69.0 in Accession #:    [id number]  Weight:       159.0 lb Date of Birth:  10/03/1946    BSA:          1.874 m Patient Age:    66 years    BP:           143/88 mmHg Patient Gender: M           HR:           95 bpm. Exam Location:  Inpatient Procedure: 2D Echo, Cardiac Doppler and Color Doppler Indications:    Congestive Heart Failure I50.9  History:        Patient has prior history of Echocardiogram examinations, most                 recent 08/15/2020. Stroke; Risk Factors:Hypertension, Diabetes                 and Non-Smoker.  Sonographer:    Vickie Epley RDCS Referring Phys: [id number] [place]  1. Left ventricular ejection fraction, by estimation, is 40 to 45%. The left ventricle has mildly decreased function. The left ventricle demonstrates global hypokinesis. The left ventricular internal cavity size was mildly dilated. Left ventricular diastolic parameters are consistent with Grade II diastolic dysfunction (pseudonormalization).  2. Right ventricular systolic function is normal. The right ventricular size is normal. There is moderately elevated pulmonary artery systolic pressure.  3. Left atrial size was mild to moderately dilated.  4. Right atrial size was mildly dilated.  5. The mitral valve is normal in structure. Trivial mitral valve regurgitation. No evidence of mitral stenosis.  6. The aortic valve is tricuspid. There is moderate calcification of the aortic valve. Aortic valve regurgitation is not visualized. Mild to moderate aortic valve sclerosis/calcification is present, without any evidence of aortic stenosis.  7. The inferior vena cava is normal in size with greater than 50% respiratory variability, suggesting right atrial pressure of 3 mmHg.  Comparison(s): Prior images reviewed side by side. Changes from prior study are noted. Conclusion(s)/Recommendation(s): EF is now moderately reduced compared to prior, in a pattern of global hypokinesis. FINDINGS  Left Ventricle: Left ventricular ejection fraction, by estimation, is 40 to 45%. The left ventricle has mildly decreased function. The left ventricle demonstrates global hypokinesis. The left ventricular internal cavity size was mildly dilated. There is  no left ventricular hypertrophy. Left ventricular diastolic parameters are consistent with Grade II diastolic dysfunction (pseudonormalization). Right Ventricle: The right ventricular size is normal. No increase in right ventricular wall thickness. Right ventricular systolic function is normal. There is moderately elevated pulmonary artery systolic pressure. The tricuspid  regurgitant velocity is 3.30 m/s, and with an assumed right atrial pressure of 3 mmHg, the estimated right ventricular systolic pressure is 82.4 mmHg. Left Atrium: Left atrial size was mild to moderately dilated. Right Atrium: Right atrial size was mildly dilated. Pericardium: There is no evidence of pericardial effusion. Mitral Valve: The mitral valve is normal in structure. Trivial mitral valve regurgitation. No evidence of mitral valve stenosis. Tricuspid Valve: The tricuspid valve is normal in structure. Tricuspid valve regurgitation is mild . No evidence of tricuspid stenosis. Aortic Valve: The aortic valve is tricuspid. There is moderate calcification of the aortic valve. Aortic valve regurgitation is not visualized. Mild to moderate aortic valve sclerosis/calcification is present, without any evidence of aortic stenosis. Pulmonic Valve: The pulmonic valve was not well visualized. Pulmonic valve regurgitation is trivial. No evidence of pulmonic stenosis. Aorta: The aortic root, ascending aorta, aortic arch and descending aorta are all structurally normal, with no evidence of dilitation or obstruction. Venous: The inferior vena cava is normal in size with greater than 50% respiratory variability, suggesting right atrial pressure of 3 mmHg. IAS/Shunts: The atrial septum is grossly normal. Additional Comments: There is a small pleural effusion in both left and right lateral regions.  LEFT VENTRICLE PLAX 2D LVIDd:         5.10 cm      Diastology LVIDs:         4.30 cm      LV e' medial:    6.09 cm/s LV PW:         0.80 cm      LV E/e' medial:  15.5 LV IVS:        0.80 cm      LV e' lateral:   8.16 cm/s LVOT diam:     2.00 cm      LV E/e' lateral: 11.6 LV SV:         56 LV SV Index:   30 LVOT Area:     3.14 cm  LV Volumes (MOD) LV vol d, MOD A2C: 189.0 ml LV vol d, MOD A4C: 162.0 ml LV vol s, MOD A2C: 106.0 ml LV vol s, MOD A4C: 100.0 ml LV SV MOD A2C:     83.0 ml LV SV MOD A4C:     162.0 ml LV SV MOD BP:       75.9 ml RIGHT VENTRICLE RV S prime:     14.60 cm/s TAPSE (M-mode): 2.6 cm LEFT ATRIUM             Index       RIGHT ATRIUM           Index LA diam:        4.90 cm 2.61 cm/m  RA Area:     13.60 cm LA Vol (A2C):   44.3 ml 23.64  ml/m RA Volume:   31.40 ml  16.76 ml/m LA Vol (A4C):   55.0 ml 29.35 ml/m LA Biplane Vol: 54.7 ml 29.19 ml/m  AORTIC VALVE LVOT Vmax:   106.00 cm/s LVOT Vmean:  68.800 cm/s LVOT VTI:    0.177 m  AORTA Ao Root diam: 3.40 cm MITRAL VALVE                TRICUSPID VALVE MV Area (PHT): 5.23 cm     TR Peak grad:   43.6 mmHg MV Decel Time: 145 msec     TR Vmax:        330.00 cm/s MV E velocity: 94.60 cm/s MV A velocity: 107.00 cm/s  SHUNTS MV E/A ratio:  0.88         Systemic VTI:  0.18 m                             Systemic Diam: 2.00 cm Buford Dresser MD Electronically signed by Buford Dresser MD Signature Date/Time: 12/04/2020/10:32:32 AM    Final    CT Head Wo Contrast  Final Result    CT Cervical Spine Wo Contrast  Final Result    DG Hips Bilat W or Wo Pelvis 3-4 Views  Final Result    DG Chest Port 1 View  Final Result      Scheduled Meds: . Chlorhexidine Gluconate Cloth  6 each Topical Q0600  . furosemide  40 mg Intravenous BID  . insulin aspart  0-6 Units Subcutaneous Q4H  . ipratropium-albuterol  3 mL Nebulization Q6H  . levothyroxine  25 mcg Intravenous Daily  . mupirocin ointment  1 application Nasal BID   PRN Meds: acetaminophen **OR** acetaminophen, levalbuterol Continuous Infusions: . ceFEPime (MAXIPIME) IV 1 g (12/04/20 1342)     LOS: 2 days     Alma Friendly, MD [facility] 12/04/2020, 6:47 PM

## 2020-12-04 NOTE — Plan of Care (Signed)
  Problem: Education: Goal: Knowledge of General Education information will improve Description: Including pain rating scale, medication(s)/side effects and non-pharmacologic comfort measures Outcome: Progressing   Problem: Clinical Measurements: Goal: Ability to maintain clinical measurements within normal limits will improve Outcome: Progressing Goal: Will remain free from infection Outcome: Progressing Goal: Diagnostic test results will improve Outcome: Progressing Goal: Respiratory complications will improve Outcome: Progressing   Problem: Activity: Goal: Risk for activity intolerance will decrease Outcome: Progressing   Problem: Nutrition: Goal: Adequate nutrition will be maintained Outcome: Progressing   Problem: Coping: Goal: Level of anxiety will decrease Outcome: Progressing   Problem: Elimination: Goal: Will not experience complications related to bowel motility Outcome: Progressing Goal: Will not experience complications related to urinary retention Outcome: Progressing   Problem: Pain Managment: Goal: General experience of comfort will improve Outcome: Progressing   Problem: Safety: Goal: Ability to remain free from injury will improve Outcome: Progressing   Problem: Skin Integrity: Goal: Risk for impaired skin integrity will decrease Outcome: Progressing   Problem: Safety: Goal: Non-violent Restraint(s) Outcome: Progressing

## 2020-12-04 NOTE — Plan of Care (Signed)
  Problem: Education: Goal: Knowledge of General Education information will improve Description: Including pain rating scale, medication(s)/side effects and non-pharmacologic comfort measures Outcome: Progressing   Problem: Clinical Measurements: Goal: Ability to maintain clinical measurements within normal limits will improve Outcome: Progressing Goal: Will remain free from infection Outcome: Progressing Goal: Diagnostic test results will improve Outcome: Progressing Goal: Respiratory complications will improve Outcome: Progressing   Problem: Activity: Goal: Risk for activity intolerance will decrease Outcome: Progressing   Problem: Nutrition: Goal: Adequate nutrition will be maintained Outcome: Progressing

## 2020-12-05 LAB — CBC WITH DIFFERENTIAL/PLATELET
Abs Immature Granulocytes: 0.03 10*3/uL (ref 0.00–0.07)
Basophils Absolute: 0 10*3/uL (ref 0.0–0.1)
Basophils Relative: 1 %
Eosinophils Absolute: 0.2 10*3/uL (ref 0.0–0.5)
Eosinophils Relative: 3 %
HCT: 28 % — ABNORMAL LOW (ref 39.0–52.0)
Hemoglobin: 9 g/dL — ABNORMAL LOW (ref 13.0–17.0)
Immature Granulocytes: 1 %
Lymphocytes Relative: 16 %
Lymphs Abs: 0.8 10*3/uL (ref 0.7–4.0)
MCH: 30.5 pg (ref 26.0–34.0)
MCHC: 32.1 g/dL (ref 30.0–36.0)
MCV: 94.9 fL (ref 80.0–100.0)
Monocytes Absolute: 0.6 10*3/uL (ref 0.1–1.0)
Monocytes Relative: 12 %
Neutro Abs: 3.4 10*3/uL (ref 1.7–7.7)
Neutrophils Relative %: 67 %
Platelets: 185 10*3/uL (ref 150–400)
RBC: 2.95 MIL/uL — ABNORMAL LOW (ref 4.22–5.81)
RDW: 14.6 % (ref 11.5–15.5)
WBC: 5 10*3/uL (ref 4.0–10.5)
nRBC: 0 % (ref 0.0–0.2)

## 2020-12-05 LAB — BASIC METABOLIC PANEL
Anion gap: 18 — ABNORMAL HIGH (ref 5–15)
BUN: 81 mg/dL — ABNORMAL HIGH (ref 8–23)
CO2: 20 mmol/L — ABNORMAL LOW (ref 22–32)
Calcium: 8.2 mg/dL — ABNORMAL LOW (ref 8.9–10.3)
Chloride: 107 mmol/L (ref 98–111)
Creatinine, Ser: 6.59 mg/dL — ABNORMAL HIGH (ref 0.61–1.24)
GFR, Estimated: 8 mL/min — ABNORMAL LOW (ref >60)
Glucose, Bld: 156 mg/dL — ABNORMAL HIGH (ref 70–99)
Potassium: 4.3 mmol/L (ref 3.5–5.1)
Sodium: 145 mmol/L (ref 135–145)

## 2020-12-05 LAB — GLUCOSE, CAPILLARY
Glucose-Capillary: 143 mg/dL — ABNORMAL HIGH (ref 70–99)
Glucose-Capillary: 167 mg/dL — ABNORMAL HIGH (ref 70–99)
Glucose-Capillary: 175 mg/dL — ABNORMAL HIGH (ref 70–99)
Glucose-Capillary: 177 mg/dL — ABNORMAL HIGH (ref 70–99)
Glucose-Capillary: 188 mg/dL — ABNORMAL HIGH (ref 70–99)
Glucose-Capillary: 254 mg/dL — ABNORMAL HIGH (ref 70–99)

## 2020-12-05 LAB — MAGNESIUM: Magnesium: 2.5 mg/dL — ABNORMAL HIGH (ref 1.7–2.4)

## 2020-12-05 MED ORDER — HYDRALAZINE HCL 50 MG PO TABS
50.0000 mg | ORAL_TABLET | Freq: Three times a day (TID) | ORAL | Status: DC
  Administered 2020-12-05 – 2020-12-06 (×5): 50 mg via ORAL
  Filled 2020-12-05 (×5): qty 1

## 2020-12-05 MED ORDER — HYDRALAZINE HCL 50 MG PO TABS: 100.0000 mg | ORAL_TABLET | Freq: Three times a day (TID) | ORAL | Status: DC

## 2020-12-05 MED ORDER — SODIUM BICARBONATE 650 MG PO TABS
650.0000 mg | ORAL_TABLET | Freq: Two times a day (BID) | ORAL | Status: DC
  Administered 2020-12-05 – 2020-12-06 (×4): 650 mg via ORAL
  Filled 2020-12-05 (×4): qty 1

## 2020-12-05 MED ORDER — AMLODIPINE BESYLATE 10 MG PO TABS
10.0000 mg | ORAL_TABLET | Freq: Every day | ORAL | Status: DC
  Administered 2020-12-05 – 2020-12-06 (×2): 10 mg via ORAL
  Filled 2020-12-05 (×2): qty 1

## 2020-12-05 MED ORDER — CALCIUM ACETATE (PHOS BINDER) 667 MG PO CAPS
1334.0000 mg | ORAL_CAPSULE | Freq: Three times a day (TID) | ORAL | Status: DC
Start: 2020-12-05 — End: 2020-12-07
  Administered 2020-12-05 – 2020-12-06 (×3): 1334 mg via ORAL
  Filled 2020-12-05 (×5): qty 2

## 2020-12-05 MED ORDER — LEVOTHYROXINE SODIUM 50 MCG PO TABS
50.0000 ug | ORAL_TABLET | Freq: Every day | ORAL | Status: DC
  Administered 2020-12-05 – 2020-12-06 (×2): 50 ug via ORAL
  Filled 2020-12-05 (×2): qty 1

## 2020-12-05 NOTE — Care Management Important Message (Signed)
Important Message  Patient Details IM Letter placed in Patient's door caddy. Name: [name] [name] MRN: [id number] Date of Birth: 05/18/1946   Medicare Important Message Given:  Yes     Kerin Salen 12/05/2020, 1:08 PM

## 2020-12-05 NOTE — Progress Notes (Signed)
Pharmacy Antibiotic Note  [name] [name] is a 75 y.o. male with hx CKD5 presented to the ED on 12/02/2020 with AMS.   CXR on 2/20 showed findings with concern for PNA. Pharmacy has been consulted dose cefepime for PNA.  Today, 12/05/20  WBC WNL  SCr 6.59, CrCl <10. Renal function at baseline (6.5-7.5)  Afebrile  Today is day #4 of IV antibiotics.  Plan:  Continue cefepime 1 g IV q24h  Renal function stable at baseline. Will sign off, please re-consult if needed.   ___________________________________ Temp (24hrs), Avg:97.7 F (36.5 C), Min:97.7 F (36.5 C), Max:97.8 F (36.6 C)  Recent Labs  Lab 12/02/20 1227 12/02/20 1450 12/03/20 0412 12/04/20 0432 12/05/20 0450  WBC 4.3  --  5.5 5.6 5.0  CREATININE 6.67*  --  6.50* 6.59* 6.59*  LATICACIDVEN 1.9 2.0*  --   --   --     Estimated Creatinine Clearance: 9.7 mL/min (A) (by C-G formula based on SCr of 6.59 mg/dL (H)).    No Known Allergies   Thank you for allowing pharmacy to be a part of this patient's care.  Lenis Noon, PharmD 12/05/2020 10:25 AM

## 2020-12-05 NOTE — Plan of Care (Signed)
Alert to self, RR even and unlabored, resting, easily awaken on 2L of nasal cannula, with waist belt restraint in place,bed low and locked. Call bell within reach. Will continue to monitor.  Problem: Education: Goal: Knowledge of General Education information will improve Description: Including pain rating scale, medication(s)/side effects and non-pharmacologic comfort measures Outcome: Progressing   Problem: Clinical Measurements: Goal: Ability to maintain clinical measurements within normal limits will improve Outcome: Progressing Goal: Will remain free from infection Outcome: Progressing Goal: Diagnostic test results will improve Outcome: Progressing Goal: Respiratory complications will improve Outcome: Progressing   Problem: Activity: Goal: Risk for activity intolerance will decrease Outcome: Progressing   Problem: Nutrition: Goal: Adequate nutrition will be maintained Outcome: Progressing   Problem: Coping: Goal: Level of anxiety will decrease Outcome: Progressing   Problem: Elimination: Goal: Will not experience complications related to bowel motility Outcome: Progressing Goal: Will not experience complications related to urinary retention Outcome: Progressing   Problem: Pain Managment: Goal: General experience of comfort will improve Outcome: Progressing   Problem: Safety: Goal: Ability to remain free from injury will improve Outcome: Progressing   Problem: Skin Integrity: Goal: Risk for impaired skin integrity will decrease Outcome: Progressing   Problem: Safety: Goal: Non-violent Restraint(s) Outcome: Progressing

## 2020-12-05 NOTE — TOC Progression Note (Signed)
Transition of Care [facility]) - Progression Note    Patient Details  Name: [name] [name] MRN: [id number] Date of Birth: 27-Feb-1946  [facility] CM/SW Contact  Purcell Mouton, RN Phone Number: 12/05/2020, 12:45 PM  Clinical Narrative:    Pt will discharge home with Hosp[facility] and his sisters.    Expected Discharge Plan: [place] Barriers to Discharge: No Barriers Identified  Expected Discharge Plan and Services Expected Discharge Plan: [place] arrangements for the past 2 months: Single Family Home                                       Social Determinants of Health (SDOH) Interventions    Readmission Risk Interventions Readmission Risk Prevention Plan 04/20/2020  Transportation Screening Complete  Medication Review [employer]) Complete  SW Recovery Care/Counseling Consult Complete  Palliative Care Screening Complete  Skilled Nursing Facility Complete  Some recent data might be hidden

## 2020-12-05 NOTE — Progress Notes (Addendum)
   Daily Progress Note   Patient Name: [name] [name]       Date: 12/05/2020 DOB: Jan 17, 1946  Age: 75 y.o. MRN#: [id number] Attending Physician: Alma Friendly, MD Primary Care Physician: Libby Maw, MD Admit Date: 12/02/2020  Reason for Consultation/Follow-up: Establishing goals of care  Subjective: Chart Reviewed.   No acute distress. Requiring restraints for safety due to increase confusion/agitation.   Sisters verbalized understanding of patient's condition. They are clear in expressed goals to treat the treatable with hopes patient will return home soon with family and continued hospice care. They are not interested in escalation of care.   Family is aware of patient's overall poor prognosis and that he is approaching end-of-life given continued decline in health.   All questions answered and support provided.  Length of Stay: 3 days  Vital Signs: BP (!) 173/108   Pulse 79   Temp 97.7 F (36.5 C) (Oral)   Resp 16   Ht '5\' 9"'$  (1.753 m)   Wt 72.1 kg   SpO2 99%   BMI 23.47 kg/m  SpO2: SpO2: 99 % O2 Device: O2 Device: Nasal Cannula O2 Flow Rate: O2 Flow Rate (L/min): 2 L/min  Palliative Care Assessment & Plan   Goals of Care/Recommendations:  Continue to treat the treatable  No escalation in care. Family realistic in expressed goals with hopes of patient returning home soon with continued family and hospice support.   Discontinue PT consult. Patient is under hospice care with plans of continuing.   Would recommend starting Seroquel or Zyprexa if patient continues to have agitation/restless.   Patient is already established with [facility].  PMT will continue to support and follow as needed. Please call team line with urgent needs.   Prognosis: POOR  Discharge Planning: Home with Hospice  Thank you for allowing the Palliative Medicine Team to assist in the care of this patient.  Time Total: 25 min.   Visit consisted of counseling and education  dealing with the complex and emotionally intense issues of symptom management and palliative care in the setting of serious and potentially life-threatening illness.Greater than 50%  of this time was spent counseling and coordinating care related to the above assessment and plan.  Alda Lea, AGPCNP-BC  Palliative Medicine Team [phone]

## 2020-12-05 NOTE — Progress Notes (Signed)
[facility] Hospitalized hospice patient note.  [name] [name] is a current hospice patient with a terminal diagnosis of ESRD. Family activated EMS after patient started experiencing increased SOB. Hospice was not notified prior to transfer to ED.  Patient was admitted to [facility] on 12/02/20 with a diagnosis of hypoxia and hypothermia. Per Dr. Orpah Melter with [facility] this is a related hospital admission.   Visited patient at bedside, no family present.  Patient is awake, alert and oriented to person. Speech is slurred. No apparent distress. Patient stated he had no pain. Patient continues to state he wants to go back home and plan is to discharge tomorrow.   VS: 97.7, 173/108, 79, 16, 99% 2Lnc I/O: 0/600  Abn Labs:  CO2: 20 (L) Glucose: 156 (H) BUN: 81 (H) Creatinine: 6.59 (H) Calcium: 8.2 (L) Anion gap: 18 (H) Magnesium: 2.5 (H) GFR, Estimated: 8 (L) WBC: 5.0 RBC: 2.95 (L) Hemoglobin: 9.0 (L) HCT: 28.0 (L)    IV and PRN medications Maxipime 1g IVPB QD no PRNs today  Problem list Acute respiratory failure with hypoxia  Hypothermia-resolved CAP Hypokalemia  Normocytic Anemia  Diabetes Type 2 CKD stage V Acute metabolic encephalopathy-resolved as of 12/03/2020  Discharge planning: Return home with hospice Family Contact: Spoke with sister [name] [name] IDG: Updated  Goals of care: Clear. DNR  Please use [facility] for ambulance transport at discharge.  A Please do not hesitate to call with questions.    Thank you,   Clementeen Graham, BSN, RN [facility] Liaison (listed on [facility] under [place])    [phone]

## 2020-12-05 NOTE — Progress Notes (Signed)
Shift Summary:   Remained alert to self. RR remained even and unlabored, remained on 2L of nasal cannula.  Blood pressure was elevated during this shift made MD [place] aware, and scheduled BP medications were added see MAR. Most recent  blood pressure prior to hydralazine administration was 149/77 (99) Cleaned up 2x this shift for incontinence episodes. PIV kinked, therefore IV was removed at the ladder part of the shift.  Will give report to night RN to give lasix once new IV access is obtained. Order for IV team to place new IV access ordered. Bed low and locked. No other needs identified. Will continue to monitor.   POC: Home with hospice.

## 2020-12-05 NOTE — Progress Notes (Addendum)
PROGRESS NOTE    [name] [name]   TGG:[id number]  DOB: [birth year]  DOA: 12/02/2020     3  PCP: Libby Maw, MD  CC: AMS  Hospital Course: [name] [name] is a 75 yo male with PMH DMII, CVA (residual cognitive impairment), HTN, CKDV who presented with worsening mentation.  He has been falling out of bed recently more as well.  EMS made multiple visits to the house however due to his worsening confusion and some wheezing/cough he was brought to the ER for further evaluation. He was recently hospitalized for similar from 11/25/2020 until 11/28/2020 for hypothermia and volume overload.  He was treated for possible pneumonia as well and discharged on antibiotics to complete course. He was initiated on IV Synthroid on admission due to ongoing elevated TSH with concern for improper administration of Synthroid at home as well as possible missed doses. Family confirmed that hospice is on board at home and patient is DNR/DNI however they wish to treat what is able to be "treated".   Interval History:  Patient denies any new complaints, met patient resting in bed.   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 Possible CAP Currently on 2 L of O2, plan to wean off Etiology considered multifactorial in setting of underlying volume overload with possible superimposed infiltrates concerning for infection CXR which may be consistent with pneumonia versus edema Repeat echo on 12/04/2020 showed EF of  40 to 45%, global hypokinesis, grade 2 diastolic dysfunction Continue cefepime, scheduled DuoNeb Continue oxygen, wean as able Continue Lasix, increased to 76m IV BID   Type 2 diabetes mellitus, uncontrolled A1c 8.7 Continue SSI and CBG monitoring   CKD stage V Baseline creat ~ 6.5 - 7.5, eGFR 8, currently at baseline Not a dialysis candidate and followed by hospice at home  Anemia of CKD Baseline 8-10 g/dL. Currently at baseline    Acute metabolic encephalopathy More alert, but still intermittently confused Monitor closely, waist restraints ordered     Antimicrobials: Cefepime 2/20 >> current  DVT prophylaxis: SCDs Start: 12/02/20 1925   Code Status:   Code Status: DNR Family Communication: None at bedside  Disposition Plan: Status is: Inpatient  Remains inpatient appropriate because:IV treatments appropriate due to intensity of illness or inability to take PO and Inpatient level of care appropriate due to severity of illness   Dispo: The patient is from: Home              Anticipated d/c is to: Home              Anticipated d/c date is: 1 day              Patient currently is not medically stable to d/c.   Difficult to place patient No   Objective: Blood pressure (!) 161/96, pulse 80, temperature 97.7 F (36.5 C), temperature source Oral, resp. rate 16, height '5\' 9"'  (1.753 m), weight 72.1 kg, SpO2 99 %.  Examination: General:  Chronically ill-appearing Cardiovascular: S1, S2 present Respiratory:  Diminished breath sounds bilaterally Abdomen: Soft,  nontender, nondistended, bowel sounds present Musculoskeletal: trace bilateral pedal edema noted Skin: Noted mark scratches on BLE Psychiatry:  Unable to assess    Consultants:  None  Procedures:  None  Data Reviewed: I have personally reviewed following labs and imaging studies Results for orders placed or performed during the hospital encounter of 12/02/20 (from the past 24 hour(s))  Glucose, capillary      Status: Abnormal   Collection Time: 12/04/20  8:11 PM  Result Value Ref Range   Glucose-Capillary 134 (H) 70 - 99 mg/dL  Glucose, capillary     Status: Abnormal   Collection Time: 12/05/20 12:01 AM  Result Value Ref Range   Glucose-Capillary 177 (H) 70 - 99 mg/dL  Glucose, capillary     Status: Abnormal   Collection Time: 12/05/20  4:39 AM  Result Value Ref Range   Glucose-Capillary 143 (H) 70 - 99 mg/dL  Basic metabolic panel     Status: Abnormal   Collection Time: 12/05/20  4:50 AM  Result Value Ref Range   Sodium 145 135 - 145 mmol/L   Potassium 4.3 3.5 - 5.1 mmol/L   Chloride 107 98 - 111 mmol/L   CO2 20 (L) 22 - 32 mmol/L   Glucose, Bld 156 (H) 70 - 99 mg/dL   BUN 81 (H) 8 - 23 mg/dL   Creatinine, Ser 6.59 (H) 0.61 - 1.24 mg/dL   Calcium 8.2 (L) 8.9 - 10.3 mg/dL   GFR, Estimated 8 (L) >60 mL/min   Anion gap 18 (H) 5 - 15  CBC with Differential/Platelet     Status: Abnormal   Collection Time: 12/05/20  4:50 AM  Result Value Ref Range   WBC 5.0 4.0 - 10.5 K/uL   RBC 2.95 (L) 4.22 - 5.81 MIL/uL   Hemoglobin 9.0 (L) 13.0 - 17.0 g/dL   HCT 28.0 (L) 39.0 - 52.0 %   MCV 94.9 80.0 - 100.0 fL   MCH 30.5 26.0 - 34.0 pg   MCHC 32.1 30.0 - 36.0 g/dL   RDW 14.6 11.5 - 15.5 %   Platelets 185 150 - 400 K/uL   nRBC 0.0 0.0 - 0.2 %   Neutrophils Relative % 67 %   Neutro Abs 3.4 1.7 - 7.7 K/uL   Lymphocytes Relative 16 %   Lymphs Abs 0.8 0.7 - 4.0 K/uL   Monocytes Relative 12 %   Monocytes Absolute 0.6 0.1 - 1.0 K/uL   Eosinophils Relative 3 %   Eosinophils Absolute 0.2 0.0 - 0.5 K/uL   Basophils Relative 1 %   Basophils Absolute 0.0 0.0 - 0.1 K/uL   Immature Granulocytes 1 %   Abs Immature Granulocytes 0.03 0.00 - 0.07 K/uL  Magnesium     Status: Abnormal   Collection Time: 12/05/20  4:50 AM  Result Value Ref Range   Magnesium 2.5 (H) 1.7 - 2.4 mg/dL  Glucose, capillary     Status: Abnormal   Collection Time: 12/05/20  7:34 AM  Result Value Ref Range    Glucose-Capillary 167 (H) 70 - 99 mg/dL  Glucose, capillary     Status: Abnormal   Collection Time: 12/05/20 12:10 PM  Result Value Ref Range   Glucose-Capillary 188 (H) 70 - 99 mg/dL    Recent Results (from the past 240 hour(s))  Resp Panel by RT-PCR (Flu A&B, Covid) Nasopharyngeal Swab     Status: None   Collection Time: 11/25/20  6:55 PM   Specimen: Nasopharyngeal Swab; Nasopharyngeal(NP) swabs in vial transport medium  Result Value Ref Range Status   SARS Coronavirus 2 by RT PCR NEGATIVE NEGATIVE Final    Comment: (NOTE) SARS-CoV-2 target nucleic acids are NOT DETECTED.  The SARS-CoV-2 RNA is generally detectable in upper respiratory specimens during the acute phase of infection. The lowest concentration of SARS-CoV-2 viral copies this assay can detect is 138 copies/mL. A negative result does not preclude SARS-Cov-2 infection and should not be used as the sole basis for treatment or other patient management decisions. A negative result may occur with  improper specimen collection/handling, submission of specimen other than nasopharyngeal swab, presence of viral mutation(s) within the areas targeted by this assay, and inadequate number of viral copies(<138 copies/mL). A negative result must be combined with clinical observations, patient history, and epidemiological information. The expected result is Negative.  Fact Sheet for Patients:  EntrepreneurPulse.com.au  Fact Sheet for Healthcare Providers:  IncredibleEmployment.be  This test is no t yet approved or cleared by the [place] FDA and  has been authorized for detection and/or diagnosis of SARS-CoV-2 by FDA under an Emergency Use Authorization (EUA). This EUA will remain  in effect (meaning this test can be used) for the duration of the COVID-19 declaration under Section 564(b)(1) of the Act, 21 U.S.C.section 360bbb-3(b)(1), unless the authorization is terminated  or revoked  sooner.       Influenza A by PCR NEGATIVE NEGATIVE Final   Influenza B by PCR NEGATIVE NEGATIVE Final    Comment: (NOTE) The Xpert Xpress SARS-CoV-2/FLU/RSV plus assay is intended as an aid in the diagnosis of influenza from Nasopharyngeal swab specimens and should not be used as a sole basis for treatment. Nasal washings and aspirates are unacceptable for Xpert Xpress SARS-CoV-2/FLU/RSV testing.  Fact Sheet for Patients: EntrepreneurPulse.com.au  Fact Sheet for Healthcare Providers: IncredibleEmployment.be  This test is not yet approved or cleared by the [place] FDA and has been authorized for detection and/or diagnosis of SARS-CoV-2 by FDA under an Emergency Use Authorization (EUA). This EUA will remain in effect (meaning this test can be used) for the duration of the COVID-19 declaration under Section 564(b)(1) of the Act, 21 U.S.C. section 360bbb-3(b)(1), unless the authorization is terminated or revoked.  Performed at [facility], [place] [address], [place], [place] [address]   Culture, blood (single)     Status: None   Collection Time: 11/25/20  7:24 PM   Specimen: BLOOD  Result Value Ref Range Status   Specimen Description BLOOD RIGHT HAND  Final   Special Requests   Final    BOTTLES DRAWN AEROBIC AND ANAEROBIC Blood Culture adequate volume   Culture   Final    NO GROWTH 5 DAYS Performed at [facility], [address], [place], [place] [address]    Report Status 11/30/2020 FINAL  Final  Resp Panel by RT-PCR (Flu A&B, Covid) Nasopharyngeal Swab     Status: None   Collection Time: 12/02/20 12:27 PM   Specimen: Nasopharyngeal Swab; Nasopharyngeal(NP) swabs in vial transport medium  Result Value Ref Range Status   SARS Coronavirus 2 by RT PCR NEGATIVE NEGATIVE Final    Comment: (NOTE) SARS-CoV-2 target nucleic acids are NOT DETECTED.  The SARS-CoV-2 RNA is generally detectable in upper respiratory specimens  during the acute phase of infection. The lowest concentration of SARS-CoV-2 viral copies this assay can detect is 138 copies/mL. A negative result does not preclude SARS-Cov-2 infection and should not be used as the sole basis for treatment or other patient management decisions.  A negative result may occur with  improper specimen collection/handling, submission of specimen other than nasopharyngeal swab, presence of viral mutation(s) within the areas targeted by this assay, and inadequate number of viral copies(<138 copies/mL). A negative result must be combined with clinical observations, patient history, and epidemiological information. The expected result is Negative.  Fact Sheet for Patients:  EntrepreneurPulse.com.au  Fact Sheet for Healthcare Providers:  IncredibleEmployment.be  This test is no t yet approved or cleared by the [place] FDA and  has been authorized for detection and/or diagnosis of SARS-CoV-2 by FDA under an Emergency Use Authorization (EUA). This EUA will remain  in effect (meaning this test can be used) for the duration of the COVID-19 declaration under Section 564(b)(1) of the Act, 21 U.S.C.section 360bbb-3(b)(1), unless the authorization is terminated  or revoked sooner.       Influenza A by PCR NEGATIVE NEGATIVE Final   Influenza B by PCR NEGATIVE NEGATIVE Final    Comment: (NOTE) The Xpert Xpress SARS-CoV-2/FLU/RSV plus assay is intended as an aid in the diagnosis of influenza from Nasopharyngeal swab specimens and should not be used as a sole basis for treatment. Nasal washings and aspirates are unacceptable for Xpert Xpress SARS-CoV-2/FLU/RSV testing.  Fact Sheet for Patients: EntrepreneurPulse.com.au  Fact Sheet for Healthcare Providers: IncredibleEmployment.be  This test is not yet approved or cleared by the [place] FDA and has been authorized for detection  and/or diagnosis of SARS-CoV-2 by FDA under an Emergency Use Authorization (EUA). This EUA will remain in effect (meaning this test can be used) for the duration of the COVID-19 declaration under Section 564(b)(1) of the Act, 21 U.S.C. section 360bbb-3(b)(1), unless the authorization is terminated or revoked.  Performed at [facility], [place] [address] [place], [place] [address]   Blood Culture (routine x 2)     Status: None (Preliminary result)   Collection Time: 12/02/20 12:27 PM   Specimen: BLOOD  Result Value Ref Range Status   Specimen Description   Final    BLOOD RIGHT ANTECUBITAL Performed at [facility] [address], [place], [place] [address]    Special Requests   Final    BOTTLES DRAWN AEROBIC AND ANAEROBIC Blood Culture adequate volume Performed at [facility] [address], [place], [place] [address]    Culture   Final    NO GROWTH 3 DAYS Performed at [facility], [place] [address], [place], [place] [address]    Report Status PENDING  Incomplete  Blood Culture (routine x 2)     Status: None (Preliminary result)   Collection Time: 12/02/20 12:32 PM   Specimen: BLOOD  Result Value Ref Range Status   Specimen Description   Final    BLOOD LEFT ANTECUBITAL Performed at [facility] [address] [place], [place] [address]    Special Requests   Final    BOTTLES DRAWN AEROBIC AND ANAEROBIC Blood Culture adequate volume Performed at [facility] [address], [place], [place] [address]    Culture   Final    NO GROWTH 3 DAYS Performed at [facility], [place] [address], [place], [place] [address]    Report Status PENDING  Incomplete  Urine culture     Status: None   Collection Time: 12/02/20  4:30 PM   Specimen: In/Out Cath Urine  Result Value Ref Range Status   Specimen Description   Final    IN/OUT CATH URINE Performed  at [facility], [place] [address], [place], [place] [address]    Special Requests   Final    NONE Performed at [facility], [place] [address], [place], [place] [address]    Culture   Final    NO GROWTH Performed at [facility], [place] [address] [place], [place] [address]    Report Status 12/04/2020 FINAL  Final  MRSA PCR Screening     Status: Abnormal   Collection Time: 12/02/20  7:25 PM   Specimen: Nasopharyngeal  Result Value Ref Range Status   MRSA by PCR POSITIVE (A) NEGATIVE Final    Comment:        The GeneXpert MRSA Assay (FDA approved for NASAL specimens only), is one component of a comprehensive MRSA colonization surveillance program. It is not intended to diagnose MRSA infection nor to guide or monitor treatment for MRSA infections. RESULT CALLED TO, READ BACK BY AND VERIFIED WITH: Gunnar Bulla RN 12/02/20 '@2317'  BY P.HENDERSON Performed at [facility] [address], [place],  [address]      Radiology Studies: ECHOCARDIOGRAM COMPLETE  Result Date: 12/04/2020    ECHOCARDIOGRAM REPORT   Patient Name:   EUGINE BUBB Date of Exam: 12/04/2020 Medical Rec #:  [id number]   Height:       69.0 in Accession #:    [id number]  Weight:       159.0 lb Date of Birth:  22-Dec-1945    BSA:          1.874 m Patient Age:    82 years    BP:           143/88 mmHg Patient Gender: M           HR:           95 bpm. Exam Location:  Inpatient Procedure: 2D Echo, Cardiac Doppler and Color Doppler Indications:    Congestive Heart Failure I50.9  History:        Patient has prior history of Echocardiogram examinations, most                 recent 08/15/2020. Stroke; Risk Factors:Hypertension, Diabetes                 and Non-Smoker.  Sonographer:    Vickie Epley RDCS Referring Phys: [id number] [place]  1. Left ventricular ejection fraction, by estimation, is 40 to 45%. The left ventricle has mildly decreased function. The left ventricle demonstrates  global hypokinesis. The left ventricular internal cavity size was mildly dilated. Left ventricular diastolic parameters are consistent with Grade II diastolic dysfunction (pseudonormalization).  2. Right ventricular systolic function is normal. The right ventricular size is normal. There is moderately elevated pulmonary artery systolic pressure.  3. Left atrial size was mild to moderately dilated.  4. Right atrial size was mildly dilated.  5. The mitral valve is normal in structure. Trivial mitral valve regurgitation. No evidence of mitral stenosis.  6. The aortic valve is tricuspid. There is moderate calcification of the aortic valve. Aortic valve regurgitation is not visualized. Mild to moderate aortic valve sclerosis/calcification is present, without any evidence of aortic stenosis.  7. The inferior vena cava is normal in size with greater than 50% respiratory variability, suggesting right atrial pressure of 3 mmHg. Comparison(s): Prior images reviewed side by side. Changes from prior study are noted. Conclusion(s)/Recommendation(s): EF is now moderately reduced compared to prior, in a pattern of global hypokinesis.  FINDINGS  Left Ventricle: Left ventricular ejection fraction, by estimation, is 40 to 45%. The left ventricle has mildly decreased function. The left ventricle demonstrates global hypokinesis. The left ventricular internal cavity size was mildly dilated. There is  no left ventricular hypertrophy. Left ventricular diastolic parameters are consistent with Grade II diastolic dysfunction (pseudonormalization). Right Ventricle: The right ventricular size is normal. No increase in right ventricular wall thickness. Right ventricular systolic function is normal. There is moderately elevated pulmonary artery systolic pressure. The tricuspid regurgitant velocity is 3.30 m/s, and with an assumed right atrial pressure of 3 mmHg, the estimated right ventricular systolic pressure is 28.0 mmHg. Left Atrium: Left  atrial size was mild to moderately dilated. Right Atrium: Right atrial size was mildly dilated. Pericardium: There is no evidence of pericardial effusion. Mitral Valve: The mitral valve is normal in structure. Trivial mitral valve regurgitation. No evidence of mitral valve stenosis. Tricuspid Valve: The tricuspid valve is normal in structure. Tricuspid valve regurgitation is mild . No evidence of tricuspid stenosis. Aortic Valve: The aortic valve is tricuspid. There is moderate calcification of the aortic valve. Aortic valve regurgitation is not visualized. Mild to moderate aortic valve sclerosis/calcification is present, without any evidence of aortic stenosis. Pulmonic Valve: The pulmonic valve was not well visualized. Pulmonic valve regurgitation is trivial. No evidence of pulmonic stenosis. Aorta: The aortic root, ascending aorta, aortic arch and descending aorta are all structurally normal, with no evidence of dilitation or obstruction. Venous: The inferior vena cava is normal in size with greater than 50% respiratory variability, suggesting right atrial pressure of 3 mmHg. IAS/Shunts: The atrial septum is grossly normal. Additional Comments: There is a small pleural effusion in both left and right lateral regions.  LEFT VENTRICLE PLAX 2D LVIDd:         5.10 cm      Diastology LVIDs:         4.30 cm      LV e' medial:    6.09 cm/s LV PW:         0.80 cm      LV E/e' medial:  15.5 LV IVS:        0.80 cm      LV e' lateral:   8.16 cm/s LVOT diam:     2.00 cm      LV E/e' lateral: 11.6 LV SV:         56 LV SV Index:   30 LVOT Area:     3.14 cm  LV Volumes (MOD) LV vol d, MOD A2C: 189.0 ml LV vol d, MOD A4C: 162.0 ml LV vol s, MOD A2C: 106.0 ml LV vol s, MOD A4C: 100.0 ml LV SV MOD A2C:     83.0 ml LV SV MOD A4C:     162.0 ml LV SV MOD BP:      75.9 ml RIGHT VENTRICLE RV S prime:     14.60 cm/s TAPSE (M-mode): 2.6 cm LEFT ATRIUM             Index       RIGHT ATRIUM           Index LA diam:        4.90 cm 2.61  cm/m  RA Area:     13.60 cm LA Vol (A2C):   44.3 ml 23.64 ml/m RA Volume:   31.40 ml  16.76 ml/m LA Vol (A4C):   55.0 ml 29.35 ml/m LA Biplane Vol: 54.7 ml 29.19 ml/m  AORTIC  VALVE LVOT Vmax:   106.00 cm/s LVOT Vmean:  68.800 cm/s LVOT VTI:    0.177 m  AORTA Ao Root diam: 3.40 cm MITRAL VALVE                TRICUSPID VALVE MV Area (PHT): 5.23 cm     TR Peak grad:   43.6 mmHg MV Decel Time: 145 msec     TR Vmax:        330.00 cm/s MV E velocity: 94.60 cm/s MV A velocity: 107.00 cm/s  SHUNTS MV E/A ratio:  0.88         Systemic VTI:  0.18 m                             Systemic Diam: 2.00 cm Buford Dresser MD Electronically signed by Buford Dresser MD Signature Date/Time: 12/04/2020/10:32:32 AM    Final    CT Head Wo Contrast  Final Result    CT Cervical Spine Wo Contrast  Final Result    DG Hips Bilat W or Wo Pelvis 3-4 Views  Final Result    DG Chest Port 1 View  Final Result      Scheduled Meds:  amLODipine  10 mg Oral Daily   calcium acetate  1,334 mg Oral TID WC   Chlorhexidine Gluconate Cloth  6 each Topical Q0600   furosemide  40 mg Intravenous BID   hydrALAZINE  50 mg Oral TID   insulin aspart  0-6 Units Subcutaneous Q4H   ipratropium-albuterol  3 mL Nebulization TID   levothyroxine  50 mcg Oral Q0600   mupirocin ointment  1 application Nasal BID   sodium bicarbonate  650 mg Oral BID   PRN Meds: acetaminophen **OR** acetaminophen, levalbuterol Continuous Infusions:  ceFEPime (MAXIPIME) IV 1 g (12/05/20 1303)     LOS: 3 days     Alma Friendly, MD [facility] 12/05/2020, 5:09 PM

## 2020-12-06 DIAGNOSIS — I5043 Acute on chronic combined systolic (congestive) and diastolic (congestive) heart failure: Secondary | ICD-10-CM

## 2020-12-06 DIAGNOSIS — Z7189 Other specified counseling: Secondary | ICD-10-CM

## 2020-12-06 LAB — BASIC METABOLIC PANEL
Anion gap: 11 (ref 5–15)
BUN: 80 mg/dL — ABNORMAL HIGH (ref 8–23)
CO2: 25 mmol/L (ref 22–32)
Calcium: 8.2 mg/dL — ABNORMAL LOW (ref 8.9–10.3)
Chloride: 109 mmol/L (ref 98–111)
Creatinine, Ser: 6.24 mg/dL — ABNORMAL HIGH (ref 0.61–1.24)
GFR, Estimated: 9 mL/min — ABNORMAL LOW (ref >60)
Glucose, Bld: 126 mg/dL — ABNORMAL HIGH (ref 70–99)
Potassium: 3.9 mmol/L (ref 3.5–5.1)
Sodium: 145 mmol/L (ref 135–145)

## 2020-12-06 LAB — CBC WITH DIFFERENTIAL/PLATELET
Abs Immature Granulocytes: 0.01 10*3/uL (ref 0.00–0.07)
Basophils Absolute: 0 10*3/uL (ref 0.0–0.1)
Basophils Relative: 1 %
Eosinophils Absolute: 0.2 10*3/uL (ref 0.0–0.5)
Eosinophils Relative: 6 %
HCT: 29.3 % — ABNORMAL LOW (ref 39.0–52.0)
Hemoglobin: 9.2 g/dL — ABNORMAL LOW (ref 13.0–17.0)
Immature Granulocytes: 0 %
Lymphocytes Relative: 20 %
Lymphs Abs: 0.8 10*3/uL (ref 0.7–4.0)
MCH: 30.7 pg (ref 26.0–34.0)
MCHC: 31.4 g/dL (ref 30.0–36.0)
MCV: 97.7 fL (ref 80.0–100.0)
Monocytes Absolute: 0.6 10*3/uL (ref 0.1–1.0)
Monocytes Relative: 16 %
Neutro Abs: 2.3 10*3/uL (ref 1.7–7.7)
Neutrophils Relative %: 57 %
Platelets: 182 10*3/uL (ref 150–400)
RBC: 3 MIL/uL — ABNORMAL LOW (ref 4.22–5.81)
RDW: 14.6 % (ref 11.5–15.5)
WBC: 4 10*3/uL (ref 4.0–10.5)
nRBC: 0 % (ref 0.0–0.2)

## 2020-12-06 LAB — GLUCOSE, CAPILLARY
Glucose-Capillary: 116 mg/dL — ABNORMAL HIGH (ref 70–99)
Glucose-Capillary: 165 mg/dL — ABNORMAL HIGH (ref 70–99)
Glucose-Capillary: 183 mg/dL — ABNORMAL HIGH (ref 70–99)
Glucose-Capillary: 214 mg/dL — ABNORMAL HIGH (ref 70–99)
Glucose-Capillary: 225 mg/dL — ABNORMAL HIGH (ref 70–99)
Glucose-Capillary: 241 mg/dL — ABNORMAL HIGH (ref 70–99)

## 2020-12-06 LAB — MAGNESIUM: Magnesium: 2.4 mg/dL (ref 1.7–2.4)

## 2020-12-06 MED ORDER — AMOXICILLIN-POT CLAVULANATE 875-125 MG PO TABS: 1.0000 | ORAL_TABLET | Freq: Every day | ORAL | 0 refills | Status: AC

## 2020-12-06 MED ORDER — DOCUSATE SODIUM 100 MG PO CAPS: 100.0000 mg | ORAL_CAPSULE | Freq: Two times a day (BID) | ORAL | 0 refills | Status: DC | PRN

## 2020-12-06 NOTE — Progress Notes (Signed)
Report given to [facility], however; [facility] stated they could not take patient, so transport now arranged with [facility], per [facility], it would be about 5 hours as there are a couple of people ahead of him. Will continue to monitor patient.

## 2020-12-06 NOTE — Plan of Care (Signed)
°  Problem: Education: Goal: Knowledge of General Education information will improve Description: Including pain rating scale, medication(s)/side effects and non-pharmacologic comfort measures Outcome: Adequate for Discharge   Problem: Clinical Measurements: Goal: Ability to maintain clinical measurements within normal limits will improve Outcome: Adequate for Discharge   Problem: Clinical Measurements: Goal: Will remain free from infection Outcome: Adequate for Discharge   Problem: Clinical Measurements: Goal: Diagnostic test results will improve Outcome: Adequate for Discharge   Problem: Clinical Measurements: Goal: Respiratory complications will improve Outcome: Adequate for Discharge   Problem: Activity: Goal: Risk for activity intolerance will decrease Outcome: Adequate for Discharge   Problem: Nutrition: Goal: Adequate nutrition will be maintained Outcome: Adequate for Discharge

## 2020-12-06 NOTE — Progress Notes (Addendum)
Patient is discharged. Picked up at 66 by [employer]. Sister, Patriot Sane, notified of discharge and states she is at home ready to receive patient.

## 2020-12-06 NOTE — Progress Notes (Signed)
SLP Cancellation Note  Patient Details Name: [name] [name] MRN: [id number] DOB: [birth year]   Cancelled treatment:       Reason Eval/Treat Not Completed: Other (comment) (pt to dc today to home with hospice, will sign off at this time)  Kathleen Lime, MS [facility] [phone] Pager [phone]    Macario Golds 12/06/2020, 12:45 PM

## 2020-12-06 NOTE — Plan of Care (Signed)
Alert and oriented to self. RR even and unlabored. On 2L of nasal cannula. Full bath given along with chuck pads changed out. Assisted with feeding clear liquid tray. Bed low and locked. No other needs identified. Will continue to monitor.   Problem: Education: Goal: Knowledge of General Education information will improve Description: Including pain rating scale, medication(s)/side effects and non-pharmacologic comfort measures Outcome: Progressing   Problem: Clinical Measurements: Goal: Ability to maintain clinical measurements within normal limits will improve Outcome: Progressing Goal: Will remain free from infection Outcome: Progressing Goal: Diagnostic test results will improve Outcome: Progressing Goal: Respiratory complications will improve Outcome: Progressing   Problem: Activity: Goal: Risk for activity intolerance will decrease Outcome: Progressing   Problem: Nutrition: Goal: Adequate nutrition will be maintained Outcome: Progressing   Problem: Coping: Goal: Level of anxiety will decrease Outcome: Progressing   Problem: Elimination: Goal: Will not experience complications related to bowel motility Outcome: Progressing Goal: Will not experience complications related to urinary retention Outcome: Progressing   Problem: Pain Managment: Goal: General experience of comfort will improve Outcome: Progressing   Problem: Safety: Goal: Ability to remain free from injury will improve Outcome: Progressing   Problem: Skin Integrity: Goal: Risk for impaired skin integrity will decrease Outcome: Progressing   Problem: Safety: Goal: Non-violent Restraint(s) Outcome: Progressing

## 2020-12-06 NOTE — Progress Notes (Signed)
Attempted to call Mick Sell ( Daughter.) Per daughter she is currently not home yet, so do not arrange transport until she gets home. Asked if she had any questions, Daughter endorsed her questions had been answered already this morning .

## 2020-12-06 NOTE — Discharge Summary (Signed)
Discharge Summary  [name] [name] MPN:[id number] DOB: April 14, 1946  PCP: Libby Maw, MD  Admit date: 12/02/2020 Discharge date: 12/06/2020  Time spent: 40 mins  Recommendations for Outpatient Follow-up:  1. Follow up with PCP  Discharge Diagnoses:  Active Hospital Problems   Diagnosis Date Noted   CAP (community acquired pneumonia) 12/03/2020   Acute respiratory failure with hypoxia ([name]) 12/03/2020   Hypokalemia 12/03/2020   Goals of care, counseling/discussion 12/03/2020   Type 2 diabetes mellitus with stage 5 chronic kidney disease ([place]) 07/27/2020   Hypothyroidism 04/22/2020   Normocytic anemia 11/18/2017   CKD (chronic kidney disease), stage V [facility])     Resolved Hospital Problems   Diagnosis Date Noted Date Resolved   Hypothermia 11/25/2020 00/86/7619   Acute metabolic encephalopathy 50/93/2671 12/03/2020    Discharge Condition: Stable  Diet recommendation: As tolerated/full liquid diet  Vitals:   12/06/20 0738 12/06/20 0843  BP:  (!) 149/76  Pulse:  71  Resp:    Temp:    SpO2: 96% 100%    History of present illness:  [name] [name] is a 75 yo male with PMH DMII, CVA (residual cognitive impairment), HTN, CKDV who presented with worsening mentation.  He has been falling out of bed recently more as well.  EMS made multiple visits to the house however due to his worsening confusion and some wheezing/cough he was brought to the ER for further evaluation. He was recently hospitalized for similar from 11/25/2020 until 11/28/2020 for hypothermia and volume overload.  He was treated for possible pneumonia as well and discharged on antibiotics to complete course. He was initiated on IV Synthroid on admission due to ongoing elevated TSH with concern for improper administration of Synthroid at home as well as possible missed doses. Family confirmed that hospice is on board at home and patient is DNR/DNI however they wish to treat what is able to be  "treated".    Today, met patient resting in bed, denies any new complaints. Speech still muffled, but easier to understand. Discussed with sister about discharge plans and the need to be diligent in giving him his medications, verbalized understanding. Discharge with home hospice (of note, hospice wasn't notified before family took patient to ED). Strict medication administration advised to prevent hospitalization.     Hospital Course:  Active Problems:   CKD (chronic kidney disease), stage V ([name])   Normocytic anemia   Hypothyroidism   Type 2 diabetes mellitus with stage 5 chronic kidney disease ([name])   CAP (community acquired pneumonia)   Acute respiratory failure with hypoxia ([name])   Hypokalemia   Goals of care, counseling/discussion    Hypothyroidism Suspect inappropriate administration of synthroid at home and patient likely not taking it daily due to his progressive functional decline; also can be elevated in setting of acute illness/functional decline S/p IV synthroid, continue home dose of 51mg PO daily (recently increased) TSH 14.36, FT4 (0.85, normal) and TT3 elevated  Goals of care, counseling/discussion Followed by hospice outpatient Treating reversible medical causes as able  Acute respiratory failure with hypoxia 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 Possible CAP Currently on 2 L of O2, saturating 100%, wean off Etiology considered multifactorial in setting of underlying volume overload with possible superimposed infiltrates concerning for infection CXR which may be consistent with pneumonia versus edema Repeat echo on 12/04/2020 showed EF of 40 to 45%, global hypokinesis, grade 2 diastolic dysfunction S/P IV lasix, continue lasix 80 mg BID S/P cefepime, switch to PO Augmentin to  complete 7 days of AB  Type 2 diabetes mellitus, uncontrolled A1c 8.7 Continue home insulin regimen  CKD stage V Baseline creat ~6.5 - 7.5, eGFR8, currently at  baseline Not a dialysis candidate and followed by hospice at home  Anemia of CKD Baseline 8-10 g/dL. Currently at baseline  Acute metabolic encephalopathy Currently at baseline       Estimated body mass index is 23.47 kg/m as calculated from the following:   Height as of this encounter: '5\' 9"'  (1.753 m).   Weight as of this encounter: 72.1 kg.    Procedures:  None   Consultations:  Palliative  Discharge Exam: BP (!) 149/76    Pulse 71    Temp 98.4 F (36.9 C) (Oral)    Resp 20    Ht '5\' 9"'  (1.753 m)    Wt 72.1 kg    SpO2 100%    BMI 23.47 kg/m   General: NAD, muffled speech  Cardiovascular: S1, S2 present Respiratory: Diminished BS b/l    Discharge Instructions You were cared for by a hospitalist during your hospital stay. If you have any questions about your discharge medications or the care you received while you were in the hospital after you are discharged, you can call the unit and asked to speak with the hospitalist on call if the hospitalist that took care of you is not available. Once you are discharged, your primary care physician will handle any further medical issues. Please note that NO REFILLS for any discharge medications will be authorized once you are discharged, as it is imperative that you return to your primary care physician (or establish a relationship with a primary care physician if you do not have one) for your aftercare needs so that they can reassess your need for medications and monitor your lab values.  Discharge Instructions    Diet - low sodium heart healthy   Complete by: As directed    Discharge wound care:   Complete by: As directed    Continue current wound care   Increase activity slowly   Complete by: As directed      Allergies as of 12/06/2020   No Known Allergies     Medication List    TAKE these medications   Accu-Chek Guide w/Device Kit 1 Device by Does not apply route QID.   acetaminophen 325 MG tablet Commonly  known as: Tylenol Take 2 tablets (650 mg total) by mouth every 6 (six) hours as needed for up to 30 doses for mild pain or moderate pain.   albuterol (2.5 MG/3ML) 0.083% nebulizer solution Commonly known as: PROVENTIL Take 2.5 mg by nebulization every 6 (six) hours as needed for shortness of breath or wheezing.   amLODipine 10 MG tablet Commonly known as: NORVASC Take 1 tablet (10 mg total) by mouth daily.   amoxicillin-clavulanate 875-125 MG tablet Commonly known as: AUGMENTIN Take 1 tablet by mouth daily for 3 days.   Basaglar KwikPen 100 UNIT/ML Inject 5 Units into the skin at bedtime.   calcium acetate 667 MG capsule Commonly known as: PHOSLO Take 2 capsules (1,334 mg total) by mouth 3 (three) times daily with meals.   cyanocobalamin 1000 MCG tablet Take 1 tablet (1,000 mcg total) by mouth daily. What changed: when to take this   docusate sodium 100 MG capsule Commonly known as: COLACE Take 1 capsule (100 mg total) by mouth 2 (two) times daily as needed for mild constipation. What changed:   when to take this  reasons to take this   feeding supplement Liqd Take 237 mLs by mouth 2 (two) times daily between meals.   furosemide 80 MG tablet Commonly known as: LASIX Take 80 mg by mouth 2 (two) times daily with breakfast and lunch.   glucose blood test strip Commonly known as: True Metrix Blood Glucose Test CHECK BLOOD SUGAR UP TO 4 TIMES A DAY. What changed:   how much to take  how to take this  when to take this  additional instructions   glucose blood test strip Commonly known as: Accu-Chek Guide Use as instructed What changed: Another medication with the same name was changed. Make sure you understand how and when to take each.   Accu-Chek Guide test strip Generic drug: glucose blood USE AS DIRECTED What changed: Another medication with the same name was changed. Make sure you understand how and when to take each.   hydrALAZINE 100 MG  tablet Commonly known as: APRESOLINE TAKE 1 TABLET BY MOUTH 3 TIMES A DAY. What changed: when to take this   hyoscyamine 0.125 MG SL tablet Commonly known as: LEVSIN SL Place 1 tablet (0.125 mg total) under the tongue every 4 (four) hours as needed (excess oral secretions).   insulin lispro 100 UNIT/ML KwikPen Commonly known as: HumaLOG KwikPen Inject 0-10 Units into the skin 3 (three) times daily as needed (blood sugar). Max daily 30 units What changed:   how much to take  reasons to take this   Insulin Pen Needle 32G X 4 MM Misc 1 Device by Does not apply route in the morning, at noon, in the evening, and at bedtime.   Insulin Syringe-Needle U-100 31G X 5/16" 0.3 ML Misc Commonly known as: BD Insulin Syringe U/F Inject 1 each as directed 2 (two) times daily. as directed   levothyroxine 50 MCG tablet Commonly known as: SYNTHROID Take 1 tablet (50 mcg total) by mouth daily before breakfast. What changed: Another medication with the same name was removed. Continue taking this medication, and follow the directions you see here.   LORazepam 0.5 MG tablet Commonly known as: ATIVAN Take 1 tablet (0.5 mg total) by mouth every 4 (four) hours as needed for anxiety. May crush, mix with water and give sublingually if needed.   oxyCODONE 5 MG immediate release tablet Commonly known as: Oxy IR/ROXICODONE Take 1 tablet (5 mg total) by mouth every 4 (four) hours as needed for severe pain. May crush, mix with water and give sublingually if needed.   prochlorperazine 10 MG tablet Commonly known as: COMPAZINE Take 1 tablet (10 mg total) by mouth every 4 (four) hours as needed for nausea or vomiting. May crush, mix with water and give sublingually.   sodium bicarbonate 650 MG tablet Take 1 tablet (650 mg total) by mouth 2 (two) times daily. What changed: when to take this            Discharge Care Instructions  (From admission, onward)         Start     Ordered   12/06/20 0000   Discharge wound care:       Comments: Continue current wound care   12/06/20 1059         No Known Allergies  Follow-up Information    Libby Maw, MD. Schedule an appointment as soon as possible for a visit in 1 week(s).   Specialty: Family Medicine Contact information: [place] [address] [phone]  The results of significant diagnostics from this hospitalization (including imaging, microbiology, ancillary and laboratory) are listed below for reference.    Significant Diagnostic Studies: CT Head Wo Contrast  Result Date: 12/02/2020 CLINICAL DATA:  75 year old male with head and neck injury following fall. Altered mental status. EXAM: CT HEAD WITHOUT CONTRAST CT CERVICAL SPINE WITHOUT CONTRAST TECHNIQUE: Multidetector CT imaging of the head and cervical spine was performed following the standard protocol without intravenous contrast. Multiplanar CT image reconstructions of the cervical spine were also generated. COMPARISON:  11/25/2020 CT/MR and prior studies. FINDINGS: CT HEAD FINDINGS Brain: No evidence of acute infarction, hemorrhage, hydrocephalus, extra-axial collection or new mass lesion/mass effect. 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 Atrophy and chronic small-vessel white matter ischemic changes are again noted. Vascular: Carotid atherosclerotic calcifications are noted. Skull: Normal. Negative for fracture or focal lesion. Sinuses/Orbits: No acute finding. Other: None. CT CERVICAL SPINE FINDINGS Alignment: Loss of the normal cervical lordosis without acute subluxation. Skull base and vertebrae: No acute fracture. No primary bone lesion or focal pathologic process. Soft tissues and spinal canal: No prevertebral fluid or swelling. No visible canal hematoma. Disc levels: Moderate to severe multilevel degenerative disc disease and spondylosis from  C3-C7 again noted and again contributing to central spinal and bony foraminal narrowing. Upper chest: Bilateral pleural effusions are noted, at least moderate sized. Ground-glass opacities within the lung apices are noted. Other: None IMPRESSION: 1. No evidence of acute intracranial abnormality. Atrophy and chronic small-vessel white matter ischemic changes. 2. No static evidence of acute injury to the cervical spine. 3. Bilateral pleural effusions, at least moderate sized, and ground-glass opacities within the lung apices. Electronically Signed   By: Margarette Canada M.D.   On: 12/02/2020 14:08   CT HEAD WO CONTRAST  Result Date: 11/25/2020 CLINICAL DATA:  Slurred speech for 3 days. Decline in mental status for 1 week. Multiple falls. EXAM: CT HEAD WITHOUT CONTRAST TECHNIQUE: Contiguous axial images were obtained from the base of the skull through the vertex without intravenous contrast. COMPARISON:  10/18/2020 FINDINGS: Brain: Small single remote right and left lacunar infarcts in the corona radiata bilaterally. Periventricular white matter and corona radiata hypodensities favor chronic ischemic microvascular white matter disease. Otherwise, the brainstem, cerebellum, cerebral peduncles, thalamus, basal ganglia, basilar cisterns, and ventricular system appear within normal limits. No intracranial hemorrhage, mass lesion, or acute CVA. Vascular: There is atherosclerotic calcification of the cavernous carotid arteries bilaterally. Skull: Unremarkable Sinuses/Orbits: Chronic left maxillary sinusitis. Left mastoid effusion. Mild chronic right sphenoid sinusitis. Other: No supplemental non-categorized findings. IMPRESSION: 1. No acute intracranial findings. 2. Periventricular white matter and corona radiata hypodensities favor chronic ischemic microvascular white matter disease. 3. Small single chronic/remote lacunar infarcts in the corona radiata bilaterally. 4. Chronic left maxillary sinusitis. Left mastoid effusion.  Mild chronic right sphenoid sinusitis. Electronically Signed   By: Van Clines M.D.   On: 11/25/2020 18:55   CT Cervical Spine Wo Contrast  Result Date: 12/02/2020 CLINICAL DATA:  75 year old male with head and neck injury following fall. Altered mental status. EXAM: CT HEAD WITHOUT CONTRAST CT CERVICAL SPINE WITHOUT CONTRAST TECHNIQUE: Multidetector CT imaging of the head and cervical spine was performed following the standard protocol without intravenous contrast. Multiplanar CT image reconstructions of the cervical spine were also generated. COMPARISON:  11/25/2020 CT/MR and prior studies. FINDINGS: CT HEAD FINDINGS Brain: No evidence of acute infarction, hemorrhage, hydrocephalus, extra-axial collection or new mass  lesion/mass effect. 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 Atrophy and chronic small-vessel white matter ischemic changes are again noted. Vascular: Carotid atherosclerotic calcifications are noted. Skull: Normal. Negative for fracture or focal lesion. Sinuses/Orbits: No acute finding. Other: None. CT CERVICAL SPINE FINDINGS Alignment: Loss of the normal cervical lordosis without acute subluxation. Skull base and vertebrae: No acute fracture. No primary bone lesion or focal pathologic process. Soft tissues and spinal canal: No prevertebral fluid or swelling. No visible canal hematoma. Disc levels: Moderate to severe multilevel degenerative disc disease and spondylosis from C3-C7 again noted and again contributing to central spinal and bony foraminal narrowing. Upper chest: Bilateral pleural effusions are noted, at least moderate sized. Ground-glass opacities within the lung apices are noted. Other: None IMPRESSION: 1. No evidence of acute intracranial abnormality. Atrophy and chronic small-vessel white matter ischemic changes. 2. No static evidence of acute injury to the cervical spine. 3. Bilateral pleural  effusions, at least moderate sized, and ground-glass opacities within the lung apices. Electronically Signed   By: Margarette Canada M.D.   On: 12/02/2020 14:08   MR BRAIN WO CONTRAST  Result Date: 11/26/2020 CLINICAL DATA:  Neurological deficit. Acute stroke suspected. Slurred speech. EXAM: MRI HEAD WITHOUT CONTRAST TECHNIQUE: Multiplanar, multiecho pulse sequences of the brain and surrounding structures were obtained without intravenous contrast. COMPARISON:  Head CT yesterday. FINDINGS: Brain: Diffusion imaging does not show any acute or subacute infarction. Chronic small-vessel ischemic changes affect pons. No focal cerebellar insult. Cerebral hemispheres show an old small vessel infarction in the right thalamus and extensive chronic small-vessel ischemic changes throughout the hemispheric white matter. There is an old right parietal cortical and subcortical infarction and an old cortical and subcortical infarction at the right parietal vertex. No intra-axial mass lesion, hemorrhage, hydrocephalus or extra-axial fluid collection. There is a small meningioma at the anterior aspect of the anterior cranial fossa on the right measuring 11 mm in diameter with a thickness 4-5 mm. No mass-effect upon the brain. Vascular: Major vessels at the base of the brain show flow. Skull and upper cervical spine: Negative Sinuses/Orbits: Paranasal sinuses are clear. Orbits are negative. There are bilateral mastoid effusions, more extensive on the left than the right. Other: None IMPRESSION: 1. No acute finding by MRI. Extensive chronic small-vessel ischemic changes throughout the brain. 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 2. 11 x 5 mm meningioma at the anterior aspect of the anterior cranial fossa on the right without mass-effect upon the brain. Electronically Signed   By: Nelson Chimes M.D.   On: 11/26/2020 00:09   DG Chest Port 1 View  Result Date: 12/02/2020 CLINICAL DATA:  Questionable sepsis,  history diabetes mellitus, hypertension EXAM: PORTABLE CHEST 1 VIEW COMPARISON:  Portable exam 1233 hours compared to 11/27/2020 FINDINGS: Enlargement of cardiac silhouette with slight vascular congestion. Mediastinal contours normal. BILATERAL pleural effusions and basilar atelectasis. Mild perihilar to basilar infiltrates question pulmonary edema versus pneumonia. No pneumothorax or acute osseous findings. Calcified granuloma lower RIGHT lung. IMPRESSION: BILATERAL pleural effusions and basilar atelectasis. Persistent pulmonary infiltrates question pulmonary edema versus pneumonia. Electronically Signed   By: Lavonia Dana M.D.   On: 12/02/2020 12:40   DG CHEST PORT 1 VIEW  Result Date: 11/27/2020 CLINICAL DATA:  Pneumonia EXAM: PORTABLE CHEST 1 VIEW COMPARISON:  September 24, 2021 FINDINGS: The heart size is unchanged. There are bilateral pleural effusions. There is persistent bibasilar airspace disease which  has progressed since the prior study. There is no pneumothorax. No acute osseous abnormality. IMPRESSION: Persistent but growing bilateral pleural effusions. Persistent and worsening bibasilar airspace disease. Electronically Signed   By: Constance Holster M.D.   On: 11/27/2020 16:21   DG Chest Portable 1 View  Result Date: 11/25/2020 CLINICAL DATA:  Altered mental status.  Slurred speech for 3 days. EXAM: PORTABLE CHEST 1 VIEW COMPARISON:  10/18/2020 FINDINGS: Airspace opacities are noted the right extending from the perihilar region to the lung base obscuring hemidiaphragm. There is additional opacity at the left lung base, mostly retrocardiac. Remainder of the lungs is clear. Right greater than left pleural effusions. No evidence of a pneumothorax. Cardiac silhouette is normal in size. No mediastinal or hilar masses. Skeletal structures are grossly intact. IMPRESSION: 1. Right greater than left pleural effusions associated with lung base opacity, consistent with atelectasis, as noted on the prior  chest CT. No convincing pneumonia and no evidence of pulmonary edema. Overall without significant change from the prior chest radiograph/chest CT. Electronically Signed   By: Lajean Manes M.D.   On: 11/25/2020 19:35   ECHOCARDIOGRAM COMPLETE  Result Date: 12/04/2020    ECHOCARDIOGRAM REPORT   Patient Name:   KRISTOPH SATTLER Date of Exam: 12/04/2020 Medical Rec #:  [id number]   Height:       69.0 in Accession #:    [id number]  Weight:       159.0 lb Date of Birth:  1946/04/27    BSA:          1.874 m Patient Age:    11 years    BP:           143/88 mmHg Patient Gender: M           HR:           95 bpm. Exam Location:  Inpatient Procedure: 2D Echo, Cardiac Doppler and Color Doppler Indications:    Congestive Heart Failure I50.9  History:        Patient has prior history of Echocardiogram examinations, most                 recent 08/15/2020. Stroke; Risk Factors:Hypertension, Diabetes                 and Non-Smoker.  Sonographer:    Vickie Epley RDCS Referring Phys: [id number]  1. Left ventricular ejection fraction, by estimation, is 40 to 45%. The left ventricle has mildly decreased function. The left ventricle demonstrates global hypokinesis. The left ventricular internal cavity size was mildly dilated. Left ventricular diastolic parameters are consistent with Grade II diastolic dysfunction (pseudonormalization).  2. Right ventricular systolic function is normal. The right ventricular size is normal. There is moderately elevated pulmonary artery systolic pressure.  3. Left atrial size was mild to moderately dilated.  4. Right atrial size was mildly dilated.  5. The mitral valve is normal in structure. Trivial mitral valve regurgitation. No evidence of mitral stenosis.  6. The aortic valve is tricuspid. There is moderate calcification of the aortic valve. Aortic valve regurgitation is not visualized. Mild to moderate aortic valve sclerosis/calcification is present, without any evidence of aortic  stenosis.  7. The inferior vena cava is normal in size with greater than 50% respiratory variability, suggesting right atrial pressure of 3 mmHg. Comparison(s): Prior images reviewed side by side. Changes from prior study are noted. Conclusion(s)/Recommendation(s): EF is now moderately reduced compared to prior, in a pattern of global hypokinesis.  FINDINGS  Left Ventricle: Left ventricular ejection fraction, by estimation, is 40 to 45%. The left ventricle has mildly decreased function. The left ventricle demonstrates global hypokinesis. The left ventricular internal cavity size was mildly dilated. There is  no left ventricular hypertrophy. Left ventricular diastolic parameters are consistent with Grade II diastolic dysfunction (pseudonormalization). Right Ventricle: The right ventricular size is normal. No increase in right ventricular wall thickness. Right ventricular systolic function is normal. There is moderately elevated pulmonary artery systolic pressure. The tricuspid regurgitant velocity is 3.30 m/s, and with an assumed right atrial pressure of 3 mmHg, the estimated right ventricular systolic pressure is 82.5 mmHg. Left Atrium: Left atrial size was mild to moderately dilated. Right Atrium: Right atrial size was mildly dilated. Pericardium: There is no evidence of pericardial effusion. Mitral Valve: The mitral valve is normal in structure. Trivial mitral valve regurgitation. No evidence of mitral valve stenosis. Tricuspid Valve: The tricuspid valve is normal in structure. Tricuspid valve regurgitation is mild . No evidence of tricuspid stenosis. Aortic Valve: The aortic valve is tricuspid. There is moderate calcification of the aortic valve. Aortic valve regurgitation is not visualized. Mild to moderate aortic valve sclerosis/calcification is present, without any evidence of aortic stenosis. Pulmonic Valve: The pulmonic valve was not well visualized. Pulmonic valve regurgitation is trivial. No evidence of  pulmonic stenosis. Aorta: The aortic root, ascending aorta, aortic arch and descending aorta are all structurally normal, with no evidence of dilitation or obstruction. Venous: The inferior vena cava is normal in size with greater than 50% respiratory variability, suggesting right atrial pressure of 3 mmHg. IAS/Shunts: The atrial septum is grossly normal. Additional Comments: There is a small pleural effusion in both left and right lateral regions.  LEFT VENTRICLE PLAX 2D LVIDd:         5.10 cm      Diastology LVIDs:         4.30 cm      LV e' medial:    6.09 cm/s LV PW:         0.80 cm      LV E/e' medial:  15.5 LV IVS:        0.80 cm      LV e' lateral:   8.16 cm/s LVOT diam:     2.00 cm      LV E/e' lateral: 11.6 LV SV:         56 LV SV Index:   30 LVOT Area:     3.14 cm  LV Volumes (MOD) LV vol d, MOD A2C: 189.0 ml LV vol d, MOD A4C: 162.0 ml LV vol s, MOD A2C: 106.0 ml LV vol s, MOD A4C: 100.0 ml LV SV MOD A2C:     83.0 ml LV SV MOD A4C:     162.0 ml LV SV MOD BP:      75.9 ml RIGHT VENTRICLE RV S prime:     14.60 cm/s TAPSE (M-mode): 2.6 cm LEFT ATRIUM             Index       RIGHT ATRIUM           Index LA diam:        4.90 cm 2.61 cm/m  RA Area:     13.60 cm LA Vol (A2C):   44.3 ml 23.64 ml/m RA Volume:   31.40 ml  16.76 ml/m LA Vol (A4C):   55.0 ml 29.35 ml/m LA Biplane Vol: 54.7 ml 29.19 ml/m  AORTIC VALVE  LVOT Vmax:   106.00 cm/s LVOT Vmean:  68.800 cm/s LVOT VTI:    0.177 m  AORTA Ao Root diam: 3.40 cm MITRAL VALVE                TRICUSPID VALVE MV Area (PHT): 5.23 cm     TR Peak grad:   43.6 mmHg MV Decel Time: 145 msec     TR Vmax:        330.00 cm/s MV E velocity: 94.60 cm/s MV A velocity: 107.00 cm/s  SHUNTS MV E/A ratio:  0.88         Systemic VTI:  0.18 m                             Systemic Diam: 2.00 cm Buford Dresser MD Electronically signed by Buford Dresser MD Signature Date/Time: 12/04/2020/10:32:32 AM    Final    DG Hips Bilat W or Wo Pelvis 3-4 Views  Result Date:  12/02/2020 CLINICAL DATA:  Acute hip and pelvic pain following fall. Initial encounter. EXAM: DG HIP (WITH OR WITHOUT PELVIS) 3-4V BILAT COMPARISON:  10/01/2016 and prior studies FINDINGS: No acute fracture or dislocation identified. RIGHT hip hemiarthroplasty and remote fracture/ORIF of the proximal LEFT femur again noted. No complicating hardware features are identified. No focal bony lesions are noted. IMPRESSION: No acute abnormality. Electronically Signed   By: Margarette Canada M.D.   On: 12/02/2020 13:56    Microbiology: Recent Results (from the past 240 hour(s))  Resp Panel by RT-PCR (Flu A&B, Covid) Nasopharyngeal Swab     Status: None   Collection Time: 12/02/20 12:27 PM   Specimen: Nasopharyngeal Swab; Nasopharyngeal(NP) swabs in vial transport medium  Result Value Ref Range Status   SARS Coronavirus 2 by RT PCR NEGATIVE NEGATIVE Final    Comment: (NOTE) SARS-CoV-2 target nucleic acids are NOT DETECTED.  The SARS-CoV-2 RNA is generally detectable in upper respiratory specimens during the acute phase of infection. The lowest concentration of SARS-CoV-2 viral copies this assay can detect is 138 copies/mL. A negative result does not preclude SARS-Cov-2 infection and should not be used as the sole basis for treatment or other patient management decisions. A negative result may occur with  improper specimen collection/handling, submission of specimen other than nasopharyngeal swab, presence of viral mutation(s) within the areas targeted by this assay, and inadequate number of viral copies(<138 copies/mL). A negative result must be combined with clinical observations, patient history, and epidemiological information. The expected result is Negative.  Fact Sheet for Patients:  EntrepreneurPulse.com.au  Fact Sheet for Healthcare Providers:  IncredibleEmployment.be  This test is no t yet approved or cleared by the [place] FDA and  has been  authorized for detection and/or diagnosis of SARS-CoV-2 by FDA under an Emergency Use Authorization (EUA). This EUA will remain  in effect (meaning this test can be used) for the duration of the COVID-19 declaration under Section 564(b)(1) of the Act, 21 U.S.C.section 360bbb-3(b)(1), unless the authorization is terminated  or revoked sooner.       Influenza A by PCR NEGATIVE NEGATIVE Final   Influenza B by PCR NEGATIVE NEGATIVE Final    Comment: (NOTE) The Xpert Xpress SARS-CoV-2/FLU/RSV plus assay is intended as an aid in the diagnosis of influenza from Nasopharyngeal swab specimens and should not be used as a sole basis for treatment. Nasal washings and aspirates are unacceptable for Xpert Xpress SARS-CoV-2/FLU/RSV testing.  Fact Sheet for Patients: EntrepreneurPulse.com.au  Fact Sheet for Healthcare Providers: IncredibleEmployment.be  This test is not yet approved or cleared by the [place] FDA and has been authorized for detection and/or diagnosis of SARS-CoV-2 by FDA under an Emergency Use Authorization (EUA). This EUA will remain in effect (meaning this test can be used) for the duration of the COVID-19 declaration under Section 564(b)(1) of the Act, 21 U.S.C. section 360bbb-3(b)(1), unless the authorization is terminated or revoked.  Performed at [facility], [place] [address], [place], [place] [address]   Blood Culture (routine x 2)     Status: None (Preliminary result)   Collection Time: 12/02/20 12:27 PM   Specimen: BLOOD  Result Value Ref Range Status   Specimen Description   Final    BLOOD RIGHT ANTECUBITAL Performed at [place] [address], [place], [place] [address]    Special Requests   Final    BOTTLES DRAWN AEROBIC AND ANAEROBIC Blood Culture adequate volume Performed at [place] [address] [place], [place] [address]    Culture   Final    NO  GROWTH 4 DAYS Performed at [facility], [place] [address], [place], [place] [address]    Report Status PENDING  Incomplete  Blood Culture (routine x 2)     Status: None (Preliminary result)   Collection Time: 12/02/20 12:32 PM   Specimen: BLOOD  Result Value Ref Range Status   Specimen Description   Final    BLOOD LEFT ANTECUBITAL Performed at [place] [address] [place], [place] [address]    Special Requests   Final    BOTTLES DRAWN AEROBIC AND ANAEROBIC Blood Culture adequate volume Performed at [place] [address] [place], [place] [address]    Culture   Final    NO GROWTH 4 DAYS Performed at [facility], [place] [address] [place], [place] [address]    Report Status PENDING  Incomplete  Urine culture     Status: None   Collection Time: 12/02/20  4:30 PM   Specimen: In/Out Cath Urine  Result Value Ref Range Status   Specimen Description   Final    IN/OUT CATH URINE Performed at [place] [address] [place], [place] [address]    Special Requests   Final    NONE Performed at [facility], [place] [address] [place], [place] [address]    Culture   Final    NO GROWTH Performed at [facility], [place] [address], [place], [place] [address]    Report Status 12/04/2020 FINAL  Final  MRSA PCR Screening     Status: Abnormal   Collection Time: 12/02/20  7:25 PM   Specimen: Nasopharyngeal  Result Value Ref Range Status   MRSA by PCR POSITIVE (A) NEGATIVE Final    Comment:        The GeneXpert MRSA Assay (FDA approved for NASAL specimens only), is one component of a comprehensive MRSA colonization surveillance program. It is not intended to diagnose MRSA infection nor to guide or monitor treatment for MRSA infections. RESULT CALLED TO, READ BACK BY AND VERIFIED WITH: Gunnar Bulla RN 12/02/20 '@2317'  BY P.HENDERSON Performed at [place] [address], [place],  [address]      Labs: Basic Metabolic Panel: Recent Labs  Lab 12/02/20 1227 12/02/20 1925 12/03/20 7672 12/04/20 0432 12/05/20 0450 12/06/20 0759  NA 142  --  140 145 145 145  K 3.4*  --  3.9 3.7 4.3 3.9  CL 101  --  102 105 107 109  CO2 25  --  19* 19* 20* 25  GLUCOSE 343*  --  406* 167* 156* 126*  BUN 85*  --  84* 83* 81* 80*  CREATININE 6.67*  --  6.50* 6.59* 6.59* 6.24*  CALCIUM 8.2*  --  8.0* 8.3* 8.2* 8.2*  MG  --  2.6*  --  2.5* 2.5* 2.4   Liver Function Tests: Recent Labs  Lab 12/02/20 1227 12/03/20 0412  AST 29 26  ALT 16 16  ALKPHOS 66 59  BILITOT 0.9 1.1  PROT 6.9 6.6  ALBUMIN 2.8* 2.9*   No results for input(s): LIPASE, AMYLASE in the last 168 hours. No results for input(s): AMMONIA in the last 168 hours. CBC: Recent Labs  Lab 12/02/20 1227 12/03/20 0412 12/04/20 0432 12/05/20 0450 12/06/20 0759  WBC 4.3 5.5 5.6 5.0 4.0  NEUTROABS 2.9  --  3.9 3.4 2.3  HGB 8.7* 8.2* 8.2* 9.0* 9.2*  HCT 26.9* 26.0* 26.6* 28.0* 29.3*  MCV 97.1 98.5 99.3 94.9 97.7  PLT 183 192 176 185 182   Cardiac Enzymes: No results for input(s): CKTOTAL, CKMB, CKMBINDEX, TROPONINI in the last 168 hours. BNP: BNP (last 3 results) Recent Labs    10/18/20 1519 11/25/20 1807 12/02/20 1229  BNP 1,167.4* 1,100.2* 891.5*    ProBNP (last 3 results) No results for input(s): PROBNP in the last 8760 hours.  CBG: Recent Labs  Lab 12/05/20 2138 12/06/20 0045 12/06/20 0416 12/06/20 0748 12/06/20 1142  GLUCAP 254* 214* 165* 116* 183*       Signed:  Alma Friendly, MD [facility] 12/06/2020, 12:48 PM

## 2020-12-07 LAB — CULTURE, BLOOD (ROUTINE X 2)
Culture: NO GROWTH
Culture: NO GROWTH
Special Requests: ADEQUATE
Special Requests: ADEQUATE

## 2020-12-20 ENCOUNTER — Emergency Department (HOSPITAL_COMMUNITY)

## 2020-12-20 ENCOUNTER — Emergency Department (HOSPITAL_COMMUNITY)
Admission: EM | Admit: 2020-12-20 | Discharge: 2020-12-20 | DRG: 291 | Disposition: A | Discharge status: Home / Self Care | Attending: Internal Medicine | Admitting: Internal Medicine

## 2020-12-20 DIAGNOSIS — E1122 Type 2 diabetes mellitus with diabetic chronic kidney disease: Secondary | ICD-10-CM | POA: Diagnosis not present

## 2020-12-20 DIAGNOSIS — E039 Hypothyroidism, unspecified: Secondary | ICD-10-CM | POA: Diagnosis present

## 2020-12-20 DIAGNOSIS — Z515 Encounter for palliative care: Secondary | ICD-10-CM | POA: Diagnosis not present

## 2020-12-20 DIAGNOSIS — Z83438 Family history of other disorder of lipoprotein metabolism and other lipidemia: Secondary | ICD-10-CM | POA: Diagnosis not present

## 2020-12-20 DIAGNOSIS — Z8673 Personal history of transient ischemic attack (TIA), and cerebral infarction without residual deficits: Secondary | ICD-10-CM

## 2020-12-20 DIAGNOSIS — Z66 Do not resuscitate: Secondary | ICD-10-CM | POA: Diagnosis not present

## 2020-12-20 DIAGNOSIS — N185 Chronic kidney disease, stage 5: Secondary | ICD-10-CM | POA: Diagnosis not present

## 2020-12-20 DIAGNOSIS — I5043 Acute on chronic combined systolic (congestive) and diastolic (congestive) heart failure: Secondary | ICD-10-CM | POA: Diagnosis present

## 2020-12-20 DIAGNOSIS — Z8249 Family history of ischemic heart disease and other diseases of the circulatory system: Secondary | ICD-10-CM | POA: Diagnosis not present

## 2020-12-20 DIAGNOSIS — Z79890 Hormone replacement therapy (postmenopausal): Secondary | ICD-10-CM | POA: Diagnosis not present

## 2020-12-20 DIAGNOSIS — D631 Anemia in chronic kidney disease: Secondary | ICD-10-CM | POA: Diagnosis present

## 2020-12-20 DIAGNOSIS — I132 Hypertensive heart and chronic kidney disease with heart failure and with stage 5 chronic kidney disease, or end stage renal disease: Secondary | ICD-10-CM | POA: Diagnosis not present

## 2020-12-20 DIAGNOSIS — F419 Anxiety disorder, unspecified: Secondary | ICD-10-CM | POA: Diagnosis present

## 2020-12-20 DIAGNOSIS — Z9981 Dependence on supplemental oxygen: Secondary | ICD-10-CM

## 2020-12-20 DIAGNOSIS — J9622 Acute and chronic respiratory failure with hypercapnia: Secondary | ICD-10-CM | POA: Diagnosis not present

## 2020-12-20 DIAGNOSIS — F0390 Unspecified dementia without behavioral disturbance: Secondary | ICD-10-CM | POA: Diagnosis present

## 2020-12-20 DIAGNOSIS — Z79899 Other long term (current) drug therapy: Secondary | ICD-10-CM

## 2020-12-20 DIAGNOSIS — Z794 Long term (current) use of insulin: Secondary | ICD-10-CM

## 2020-12-20 DIAGNOSIS — Z96641 Presence of right artificial hip joint: Secondary | ICD-10-CM | POA: Diagnosis present

## 2020-12-20 DIAGNOSIS — R0602 Shortness of breath: Secondary | ICD-10-CM

## 2020-12-20 DIAGNOSIS — Z820 Family history of epilepsy and other diseases of the nervous system: Secondary | ICD-10-CM | POA: Diagnosis not present

## 2020-12-20 DIAGNOSIS — IMO0002 Reserved for concepts with insufficient information to code with codable children: Secondary | ICD-10-CM

## 2020-12-20 DIAGNOSIS — Z20822 Contact with and (suspected) exposure to covid-19: Secondary | ICD-10-CM | POA: Diagnosis not present

## 2020-12-20 DIAGNOSIS — N186 End stage renal disease: Secondary | ICD-10-CM

## 2020-12-20 DIAGNOSIS — J9621 Acute and chronic respiratory failure with hypoxia: Secondary | ICD-10-CM | POA: Diagnosis present

## 2020-12-20 DIAGNOSIS — E1165 Type 2 diabetes mellitus with hyperglycemia: Secondary | ICD-10-CM | POA: Diagnosis not present

## 2020-12-20 LAB — CBC WITH DIFFERENTIAL/PLATELET
Abs Immature Granulocytes: 0.01 10*3/uL (ref 0.00–0.07)
Basophils Absolute: 0 10*3/uL (ref 0.0–0.1)
Basophils Relative: 1 %
Eosinophils Absolute: 0.1 10*3/uL (ref 0.0–0.5)
Eosinophils Relative: 2 %
HCT: 23.6 % — ABNORMAL LOW (ref 39.0–52.0)
Hemoglobin: 7.1 g/dL — ABNORMAL LOW (ref 13.0–17.0)
Immature Granulocytes: 0 %
Lymphocytes Relative: 12 %
Lymphs Abs: 0.6 10*3/uL — ABNORMAL LOW (ref 0.7–4.0)
MCH: 30.2 pg (ref 26.0–34.0)
MCHC: 30.1 g/dL (ref 30.0–36.0)
MCV: 100.4 fL — ABNORMAL HIGH (ref 80.0–100.0)
Monocytes Absolute: 0.6 10*3/uL (ref 0.1–1.0)
Monocytes Relative: 13 %
Neutro Abs: 3.2 10*3/uL (ref 1.7–7.7)
Neutrophils Relative %: 72 %
Platelets: 188 10*3/uL (ref 150–400)
RBC: 2.35 MIL/uL — ABNORMAL LOW (ref 4.22–5.81)
RDW: 14.7 % (ref 11.5–15.5)
WBC: 4.5 10*3/uL (ref 4.0–10.5)
nRBC: 0 % (ref 0.0–0.2)

## 2020-12-20 LAB — COMPREHENSIVE METABOLIC PANEL
ALT: 13 U/L (ref 0–44)
AST: 19 U/L (ref 15–41)
Albumin: 2.3 g/dL — ABNORMAL LOW (ref 3.5–5.0)
Alkaline Phosphatase: 54 U/L (ref 38–126)
Anion gap: 13 (ref 5–15)
BUN: 68 mg/dL — ABNORMAL HIGH (ref 8–23)
CO2: 26 mmol/L (ref 22–32)
Calcium: 7.6 mg/dL — ABNORMAL LOW (ref 8.9–10.3)
Chloride: 103 mmol/L (ref 98–111)
Creatinine, Ser: 8.05 mg/dL — ABNORMAL HIGH (ref 0.61–1.24)
GFR, Estimated: 6 mL/min — ABNORMAL LOW (ref >60)
Glucose, Bld: 324 mg/dL — ABNORMAL HIGH (ref 70–99)
Potassium: 3.5 mmol/L (ref 3.5–5.1)
Sodium: 142 mmol/L (ref 135–145)
Total Bilirubin: 0.8 mg/dL (ref 0.3–1.2)
Total Protein: 5.6 g/dL — ABNORMAL LOW (ref 6.5–8.1)

## 2020-12-20 LAB — RESP PANEL BY RT-PCR (FLU A&B, COVID) ARPGX2
Influenza A by PCR: NEGATIVE
Influenza B by PCR: NEGATIVE
SARS Coronavirus 2 by RT PCR: NEGATIVE

## 2020-12-20 LAB — RETICULOCYTES
Immature Retic Fract: 8.7 % (ref 2.3–15.9)
RBC.: 2.35 MIL/uL — ABNORMAL LOW (ref 4.22–5.81)
Retic Count, Absolute: 26.1 10*3/uL (ref 19.0–186.0)
Retic Ct Pct: 1.1 % (ref 0.4–3.1)

## 2020-12-20 LAB — TYPE AND SCREEN
ABO/RH(D): B POS
Antibody Screen: NEGATIVE

## 2020-12-20 LAB — TROPONIN I (HIGH SENSITIVITY): Troponin I (High Sensitivity): 37 ng/L — ABNORMAL HIGH (ref <18)

## 2020-12-20 LAB — POC OCCULT BLOOD, ED: Fecal Occult Bld: NEGATIVE

## 2020-12-20 LAB — BRAIN NATRIURETIC PEPTIDE: B Natriuretic Peptide: 1200 pg/mL — ABNORMAL HIGH (ref 0.0–100.0)

## 2020-12-20 MED ORDER — FUROSEMIDE 10 MG/ML IJ SOLN
80.0000 mg | Freq: Once | INTRAMUSCULAR | Status: AC
  Administered 2020-12-20: 80 mg via INTRAVENOUS
  Filled 2020-12-20: qty 8

## 2020-12-20 MED ORDER — SODIUM CHLORIDE 0.9 % IV SOLN
80.0000 mg | Freq: Once | INTRAVENOUS | Status: AC
  Administered 2020-12-20: 10:00:00 80 mg via INTRAVENOUS
  Filled 2020-12-20: qty 80

## 2020-12-20 MED ORDER — SODIUM CHLORIDE 0.9% FLUSH: 3.0000 mL | INTRAVENOUS | Status: DC | PRN

## 2020-12-20 MED ORDER — SODIUM CHLORIDE 0.9 % IV SOLN: 250.0000 mL | INTRAVENOUS | Status: DC | PRN

## 2020-12-20 MED ORDER — SODIUM CHLORIDE 0.9% FLUSH
3.0000 mL | Freq: Two times a day (BID) | INTRAVENOUS | Status: DC
  Administered 2020-12-20: 3 mL via INTRAVENOUS

## 2020-12-20 MED ORDER — OXYCODONE HCL 5 MG PO TABS: 5.0000 mg | ORAL_TABLET | ORAL | Status: DC | PRN

## 2020-12-20 MED ORDER — ACETAMINOPHEN 325 MG PO TABS: 650.0000 mg | ORAL_TABLET | ORAL | Status: DC | PRN

## 2020-12-20 NOTE — ED Notes (Signed)
[name] [name] is calling out x4 times. that he ready to got home .reported to DR.Schlossman

## 2020-12-20 NOTE — ED Notes (Signed)
Called [facility] [phone]) to arrange transport at 13:50l

## 2020-12-20 NOTE — ED Notes (Signed)
Bladder scanned patinet, 148m of urine in bladder. Pt requesting to go home. Educated patient on importance of staying in the ER, but patient continues to state "I am going home, I am going to drive myself home." Provider notified.

## 2020-12-20 NOTE — Progress Notes (Signed)
Manufacturing engineer [facility])      This patient is enrolled with hospice service with a terminal diagnosis of ESRD.  [facility] will continue to follow for any discharge planning needs and to coordinate continuation of hospice care.      Thank you for the opportunity to participate in this patient's care.     Domenic Moras, BSN, RN [facility] Liaison   [phone] (24 h on call)

## 2020-12-20 NOTE — ED Triage Notes (Signed)
Pt bib gems c/o sob onset one hour ago. Wife states pt had pulled off his Baxter during the night and was struggling to breathe. She gave him an albuterol treatment with no relief so she called ems. Hx of COPD and CHF - 2L home O2 worn at all times. EMS reports 88% O2 on arrival that rose to 98% after duoneb.   RR: 36  HR: 110  BP: 145/72  Temp: 98

## 2020-12-20 NOTE — Consult Note (Signed)
[facility] Medical Consultation  [name] [name] KDX:[id number] DOB: Jul 20, 1946 DOA: 12/20/2020 PCP: Libby Maw, MD   Requesting physician: Gareth Morgan, MD Date of consultation: 12/20/20 Reason for consultation: Shortness of  Impression/Recommendations Acute on chronic respiratory failure with hypoxia and hypercapnia  Acute on chronic combined systolic and diastolic congestive heart failure Hypothyroidism DNR Hospice care patient  Patient presents with acute on chronic respiratory failure and hypoxia secondary to combination CHF exacerbation along with worsening renal function.  He had received 80 mg of Lasix IV, but only had minimal urine output while in the emergency department of approximately 100 mL.  Given poor response to IV Lasix and worsening kidney function patient has very poor overall outcome given poor candidate for hemodialysis and patient family would not want to pursue any aggressive measures.  Patient is to remain a DNR.  At this time family would like the patient to go back home.  Hospice nurse made aware and they will try and meet patient when he arrives home.  Please contact me if I can be of assistance in the meanwhile. Thank you for this consultation.  Chief Complaint: Shortness of breath  HPI:  [name] [name] is a 75 y.o. male with medical history significant of hypertension, combined CHF last EF 40 -45% with grade 2 diastolic dysfunction, diabetes mellitus type 2, CKD stage V , and dementia presented with reports of respiratory distress.  He is cared for by his sisters at home.  Apparently this morning they found him without has oxygen in his nose, breathing really hard, and wheezing.  They replaced the nasal cannula and gave him a albuterol breathing treatment and Ativan for anxiety.  However, patient's breathing did not seem to improve they called EMS to bring the patient to the hospital upon admission to the emergency department patient was seen to be  afebrile with respirations 21-25, blood pressures maintained, and O2 saturations 92-96% on 3 L of nasal cannula oxygen. Labs significant for hemoglobin 7.1, CO2 26, BUN 68, creatinine 8.05, glucose 324, BNP 1200, and troponin 37.  Stool guaiacs were noted to be negative.  Chest x-ray significant for bilateral pleural effusions with progression on the right since last month.  Patient had been given Lasix IV 42m.  He had only put out about 100 mL of urine from after receiving 80 mg of Lasix IV.  Patient requesting to go home but appears to be confused.  In talking with the patient's sister over the phone discussed that patient overall prognosis is poor at this time as giving him twice his home IV Lasix dose only provided minimal urine output.  With worsening signs of heart failure and kidney function limited options as hemodialysis and aggressive therapy was not warranted interventions like a Lasix drip likely would not change patient's overall outcome and possibly prolong suffering.  Discussed comfort care measures with sister over the phone.  She would like the patient to come back home at this time.  Hospice nurse had discussed other options including [facility].  Patient family would like him to come home  Review of Systems: Review of Systems  Unable to perform ROS: Dementia  Respiratory: Positive for shortness of breath.      Past Medical History:  Diagnosis Date  . 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 Closed comminuted [place]trochanteric fracture of left femur ([place]   . Diabetes mellitus without complication ([facility]   . Diabetic hyperosmolar non-ketotic state ([place] 05/15/2016  .  DKA (diabetic ketoacidoses) 05/09/2016  . Hypertension   . Hypoglycemia 11/29/2019  . Postoperative anemia due to acute blood loss 06/18/2016  . Retroperitoneal hematoma 06/18/2016  . Stroke ([place])   . Vitamin B 12 deficiency 06/18/2016   Past Surgical History:  Procedure  Laterality Date  . HIP ARTHROPLASTY Right 02/05/2013   Procedure: ARTHROPLASTY BIPOLAR HIP;  Surgeon: [name], MD;  Location: [facility];  Service: Orthopedics;  Laterality: Right;  . INTRAMEDULLARY (IM) NAIL INTERTROCHANTERIC Left 06/16/2016   Procedure: INTRAMEDULLARY (IM) NAIL INTERTROCHANTRIC;  Surgeon: Rod Can, MD;  Location: [facility];  Service: Orthopedics;  Laterality: Left;   Social History:  reports that he has never smoked. He has never used smokeless tobacco. He reports that he does not drink alcohol and does not use drugs.  No Known Allergies Family History  Problem Relation Age of Onset  . Alzheimer's disease Mother   . Hypertension Mother   . Hyperlipidemia Father   . Hypertension Father   . Healthy Maternal Grandmother   . Pneumonia Maternal Grandfather     Prior to Admission medications   Medication Sig Start Date End Date Taking? Authorizing Provider  ACCU-CHEK GUIDE test strip USE AS DIRECTED 06/12/20   Azzie Glatter, [name]  acetaminophen (TYLENOL) 325 MG tablet Take 2 tablets (650 mg total) by mouth every 6 (six) hours as needed for up to 30 doses for mild pain or moderate pain. Patient not taking: No sig reported 07/06/20   Wyvonnia Dusky, MD  albuterol (PROVENTIL) (2.5 MG/3ML) 0.083% nebulizer solution Take 2.5 mg by nebulization every 6 (six) hours as needed for shortness of breath or wheezing. 11/23/20   [provider]  amLODipine (NORVASC) 10 MG tablet Take 1 tablet (10 mg total) by mouth daily. 06/26/20   Libby Maw, MD  Blood Glucose Monitoring Suppl (ACCU-CHEK GUIDE) w/Device KIT 1 Device by Does not apply route QID. 11/02/19   Azzie Glatter, [name]  calcium acetate (PHOSLO) 667 MG capsule Take 2 capsules (1,334 mg total) by mouth 3 (three) times daily with meals. 10/22/20   [name], [name], DO  docusate sodium (COLACE) 100 MG capsule Take 1 capsule (100 mg total) by mouth 2 (two) times daily as needed for mild constipation. 12/06/20    Alma Friendly, MD  feeding supplement (ENSURE ENLIVE / ENSURE PLUS) LIQD Take 237 mLs by mouth 2 (two) times daily between meals. Patient not taking: No sig reported 10/23/20   [name], [name], DO  furosemide (LASIX) 80 MG tablet Take 80 mg by mouth 2 (two) times daily with breakfast and lunch.    [provider]  glucose blood (ACCU-CHEK GUIDE) test strip Use as instructed 11/03/18   Azzie Glatter, [name]  glucose blood (TRUE METRIX BLOOD GLUCOSE TEST) test strip CHECK BLOOD SUGAR UP TO 4 TIMES A DAY. Patient taking differently: 1 each by Other route See admin instructions. CHECK BLOOD SUGAR UP TO 2 TIMES A DAY. 05/25/18   Azzie Glatter, [name]  hydrALAZINE (APRESOLINE) 100 MG tablet TAKE 1 TABLET BY MOUTH 3 TIMES A DAY. Patient taking differently: Take 100 mg by mouth 3 (three) times daily with meals. 07/30/20   Libby Maw, MD  hyoscyamine (LEVSIN SL) 0.125 MG SL tablet Place 1 tablet (0.125 mg total) under the tongue every 4 (four) hours as needed (excess oral secretions). 10/22/20   [name], [name], DO  Insulin Glargine (BASAGLAR KWIKPEN) 100 UNIT/ML Inject 5 Units into the skin at bedtime. 08/20/20  [name], DO  insulin lispro (HUMALOG KWIKPEN) 100 UNIT/ML KwikPen Inject 0-10 Units into the skin 3 (three) times daily as needed (blood sugar). Max daily 30 units Patient taking differently: Inject 1-10 Units into the skin 3 (three) times daily as needed (high blood sugar, CBG >120). Max daily 30 units 08/20/20   [name], DO  Insulin Pen Needle 32G X 4 MM MISC 1 Device by Does not apply route in the morning, at noon, in the evening, and at bedtime. 01/03/20   [name], [name], MD  Insulin Syringe-Needle U-100 (BD INSULIN SYRINGE U/F) 31G X 5/16" 0.3 ML MISC Inject 1 each as directed 2 (two) times daily. as directed 11/02/19   Azzie Glatter, [name]  levothyroxine (SYNTHROID) 50 MCG tablet Take 1 tablet (50 mcg total) by mouth daily before breakfast. 11/29/20    Oswald Hillock, MD  LORazepam (ATIVAN) 0.5 MG tablet Take 1 tablet (0.5 mg total) by mouth every 4 (four) hours as needed for anxiety. May crush, mix with water and give sublingually if needed. 10/22/20   [name], [name], DO  oxyCODONE (OXY IR/ROXICODONE) 5 MG immediate release tablet Take 1 tablet (5 mg total) by mouth every 4 (four) hours as needed for severe pain. May crush, mix with water and give sublingually if needed. 10/22/20   [name], [name], DO  prochlorperazine (COMPAZINE) 10 MG tablet Take 1 tablet (10 mg total) by mouth every 4 (four) hours as needed for nausea or vomiting. May crush, mix with water and give sublingually. Patient not taking: No sig reported 10/22/20   [name], [name], DO  sodium bicarbonate 650 MG tablet Take 1 tablet (650 mg total) by mouth 2 (two) times daily. Patient taking differently: Take 650 mg by mouth 2 (two) times daily with a meal. 04/19/20   [name], MD  vitamin B-12 1000 MCG tablet Take 1 tablet (1,000 mcg total) by mouth daily. Patient taking differently: Take 1,000 mcg by mouth daily with lunch. 12/05/19   [name], MD   Physical Exam:  Constitutional: Elderly male who appears to be no acute distress at this time Vitals:   12/20/20 0615 12/20/20 0630 12/20/20 0830 12/20/20 1045  BP: 121/63 122/71 (!) 141/76 130/73  Pulse: 88 88 88 85  Resp: (!) 24 (!) 22 (!) 25 (!) 21  Temp:      TempSrc:      SpO2: 95% 95% 95% 95%   Eyes: PERRL, lids and conjunctivae normal ENMT: Mucous membranes are moist. Posterior pharynx clear of any exudate or lesions. .  Neck: normal, supple, no masses, no thyromegaly Respiratory: Tachypneic with crackles appreciated in both lung fields.  Patient with 3 L of oxygen on currently. Cardiovascular: Regular rate and rhythm, no murmurs / rubs / gallops.  Legs 1+ pitting bilateral extremity edema. 2+ pedal pulses. No carotid bruits.  Abdomen: no tenderness, no masses palpated. No hepatosplenomegaly. Bowel sounds positive.   Musculoskeletal: no clubbing / cyanosis. No joint deformity upper and lower extremities. Good ROM, no contractures. Normal muscle tone.  Skin: no rashes, lesions, ulcers. No induration Neurologic: CN 2-12 grossly intact. Sensation intact, DTR normal. Strength 5/5 in all 4.  Psychiatric: Normal judgment and insight. Alert and oriented x 3. Normal mood.   Labs on Admission:  Basic Metabolic Panel: Recent Labs  Lab 12/20/20 0721  NA 142  K 3.5  CL 103  CO2 26  GLUCOSE 324*  BUN 68*  CREATININE 8.05*  CALCIUM 7.6*   Liver  Function Tests: Recent Labs  Lab 12/20/20 0721  AST 19  ALT 13  ALKPHOS 54  BILITOT 0.8  PROT 5.6*  ALBUMIN 2.3*   No results for input(s): LIPASE, AMYLASE in the last 168 hours. No results for input(s): AMMONIA in the last 168 hours. CBC: Recent Labs  Lab 12/20/20 0721  WBC 4.5  NEUTROABS 3.2  HGB 7.1*  HCT 23.6*  MCV 100.4*  PLT 188   Cardiac Enzymes: No results for input(s): CKTOTAL, CKMB, CKMBINDEX, TROPONINI in the last 168 hours. BNP: Invalid input(s): POCBNP CBG: No results for input(s): GLUCAP in the last 168 hours.  Radiological Exams on Admission: DG Chest Port 1 View  Result Date: 12/20/2020 CLINICAL DATA:  75 year old male with shortness of breath. EXAM: PORTABLE CHEST 1 VIEW COMPARISON:  Portable chest 12/02/2020 and earlier. FINDINGS: Portable AP semi upright view at 0523 hours. Similar patient rotation to the right. Veiling opacity throughout both lower lungs, mildly progressed on the right and stable on the left since last month. No superimposed pneumothorax. Upper lobe pulmonary vascularity appears stable to mildly improved from last month. No air bronchograms. Stable visible mediastinal contours. Visualized tracheal air column is within normal limits. Paucity of bowel gas in the upper abdomen. No acute osseous abnormality identified. IMPRESSION: Ongoing bilateral pleural effusions, moderate and progressed on the right since last  month. Underlying pulmonary vascular congestion appears stable to mildly improved. Electronically Signed   By: Genevie Ann M.D.   On: 12/20/2020 05:29    EKG: Independently reviewed.  Sinus rhythm at 93 bpm with QTc 540  Time spent: >45 minutes  [facility]   If 7PM-7AM, please contact night-coverage

## 2020-12-20 NOTE — ED Provider Notes (Signed)
[facility] Provider Note   CSN: [id number] Arrival date & time: 12/20/20  1443     History Chief Complaint  Patient presents with  . Shortness of Breath    Danford Tat is a 75 y.o. male.  75 year old male presents from home for dyspnea.  [place] the patient wears oxygen all the time.  His sister states that sometimes he will pull off his own oxygen so as people are walking by the just put it back on.  Tonight he seemed acute more short of breath he normally is without his oxygen so he called EMS.  Breathing treatment prior to EMS, and EMS gave him some more and brought him in for evaluation.  Patient denies any significant abnormalities.  States he feels okay.   Shortness of Breath Severity:  Moderate Onset quality:  Sudden Timing:  Constant      Past Medical History:  Diagnosis Date  . 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 Closed comminuted intertrochanteric fracture of left femur ([place])   . Diabetes mellitus without complication ([place])   . Diabetic hyperosmolar non-ketotic state ([place]) 05/15/2016  . DKA (diabetic ketoacidoses) 05/09/2016  . Hypertension   . Hypoglycemia 11/29/2019  . Postoperative anemia due to acute blood loss 06/18/2016  . Retroperitoneal hematoma 06/18/2016  . Stroke ([place])   . Vitamin B 12 deficiency 06/18/2016    Patient Active Problem List   Diagnosis Date Noted  . CAP (community acquired pneumonia) 12/03/2020  . Acute respiratory failure with hypoxia ([place]) 12/03/2020  . Hypokalemia 12/03/2020  . Goals of care, counseling/discussion 12/03/2020  . Aspiration, chronic pulmonary 10/19/2020  . Volume overload 10/19/2020  . Fluid overload 10/18/2020  . Prolonged QT interval 10/18/2020  . Palliative care by specialist   . Acute encephalopathy 08/18/2020  . Aspiration pneumonia of right lower lobe ([place]) 08/18/2020  . AKI (acute kidney injury) ([place]) 08/15/2020  .  Type 1 diabetes mellitus with hyperglycemia ([place]) 07/27/2020  . 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 Type 2 diabetes mellitus with stage 5 chronic kidney disease ([place]) 07/27/2020  . Thrombocytopenia ([place]) 04/23/2020  . Hypothyroidism 04/22/2020  . DKA (diabetic ketoacidoses) 04/14/2020  . PVD (peripheral vascular disease) ([place]) 02/02/2020  . Poor social situation 12/08/2019  . CKD (chronic kidney disease) stage 4, GFR 15-29 ml/min ([name]) 12/08/2019  . Cognitive decline 11/22/2019  . ETOH abuse 05/17/2019  . Chronic diastolic CHF (congestive heart failure) ([place]) 05/17/2019  . Uncontrolled type 2 diabetes mellitus with hyperglycemia, with long-term current use of insulin ([place]) 10/03/2018  . Underweight 11/18/2017  . Normocytic anemia 11/18/2017  . History of cerebrovascular accident (CVA) in adulthood 07/25/2017  . Depression with anxiety 07/25/2017  . Left ventricular diastolic dysfunction 15/40/0867  . Dementia ([place]) 07/25/2017  . Brittle diabetes mellitus ([place]) 06/28/2017  . Hyperlipidemia associated with type 2 diabetes mellitus ([place]) 06/27/2017  . Hypertension associated with diabetes ([place]) 06/27/2017  . Weakness 11/16/2016  . CKD (chronic kidney disease), stage V ([place])   . Noncompliance with medication regimen 08/17/2016  . History of DVT (deep vein thrombosis) 07/06/2016  . Vitamin B 12 deficiency 06/18/2016  . Hyperglycemia 05/15/2016  . Hyponatremia 05/15/2016  . Anxiety 05/15/2016  . Narcotic dependency, continuous ([place]) 11/02/2015  . Uncontrolled type 2 diabetes mellitus with diabetic nephropathy, with long-term current use of insulin ([place]) 08/12/2015  . Anemia of chronic disease 02/02/2013    Past  Surgical History:  Procedure Laterality Date  . HIP ARTHROPLASTY Right 02/05/2013   Procedure: ARTHROPLASTY BIPOLAR HIP;  Surgeon: [name], MD;  Location: [facility];  Service: Orthopedics;  Laterality: Right;  . INTRAMEDULLARY  (IM) NAIL INTERTROCHANTERIC Left 06/16/2016   Procedure: INTRAMEDULLARY (IM) NAIL INTERTROCHANTRIC;  Surgeon: Rod Can, MD;  Location: [facility];  Service: Orthopedics;  Laterality: Left;       Family History  Problem Relation Age of Onset  . Alzheimer's disease Mother   . Hypertension Mother   . Hyperlipidemia Father   . Hypertension Father   . Healthy Maternal Grandmother   . Pneumonia Maternal Grandfather     Social History   Tobacco Use  . Smoking status: Never Smoker  . Smokeless tobacco: Never Used  Vaping Use  . Vaping Use: Never used  Substance Use Topics  . Alcohol use: No  . Drug use: No    Home Medications Prior to Admission medications   Medication Sig Start Date End Date Taking? Authorizing Provider  ACCU-CHEK GUIDE test strip USE AS DIRECTED 06/12/20   Azzie Glatter, [name]  acetaminophen (TYLENOL) 325 MG tablet Take 2 tablets (650 mg total) by mouth every 6 (six) hours as needed for up to 30 doses for mild pain or moderate pain. Patient not taking: No sig reported 07/06/20   Wyvonnia Dusky, MD  albuterol (PROVENTIL) (2.5 MG/3ML) 0.083% nebulizer solution Take 2.5 mg by nebulization every 6 (six) hours as needed for shortness of breath or wheezing. 11/23/20   [provider]  amLODipine (NORVASC) 10 MG tablet Take 1 tablet (10 mg total) by mouth daily. 06/26/20   Libby Maw, MD  Blood Glucose Monitoring Suppl (ACCU-CHEK GUIDE) w/Device KIT 1 Device by Does not apply route QID. 11/02/19   Azzie Glatter, [name]  calcium acetate (PHOSLO) 667 MG capsule Take 2 capsules (1,334 mg total) by mouth 3 (three) times daily with meals. 10/22/20   [name], [name], DO  docusate sodium (COLACE) 100 MG capsule Take 1 capsule (100 mg total) by mouth 2 (two) times daily as needed for mild constipation. 12/06/20   Alma Friendly, MD  feeding supplement (ENSURE ENLIVE / ENSURE PLUS) LIQD Take 237 mLs by mouth 2 (two) times daily between meals. Patient not  taking: No sig reported 10/23/20   [name], [name], DO  furosemide (LASIX) 80 MG tablet Take 80 mg by mouth 2 (two) times daily with breakfast and lunch.    [provider]  glucose blood (ACCU-CHEK GUIDE) test strip Use as instructed 11/03/18   Azzie Glatter, [name]  glucose blood (TRUE METRIX BLOOD GLUCOSE TEST) test strip CHECK BLOOD SUGAR UP TO 4 TIMES A DAY. Patient taking differently: 1 each by Other route See admin instructions. CHECK BLOOD SUGAR UP TO 2 TIMES A DAY. 05/25/18   Azzie Glatter, [name]  hydrALAZINE (APRESOLINE) 100 MG tablet TAKE 1 TABLET BY MOUTH 3 TIMES A DAY. Patient taking differently: Take 100 mg by mouth 3 (three) times daily with meals. 07/30/20   Libby Maw, MD  hyoscyamine (LEVSIN SL) 0.125 MG SL tablet Place 1 tablet (0.125 mg total) under the tongue every 4 (four) hours as needed (excess oral secretions). 10/22/20   [name], [name], DO  Insulin Glargine (BASAGLAR KWIKPEN) 100 UNIT/ML Inject 5 Units into the skin at bedtime. 08/20/20   [name], DO  insulin lispro (HUMALOG KWIKPEN) 100 UNIT/ML KwikPen Inject 0-10 Units into the skin 3 (three) times daily as  needed (blood sugar). Max daily 30 units Patient taking differently: Inject 1-10 Units into the skin 3 (three) times daily as needed (high blood sugar, CBG >120). Max daily 30 units 08/20/20   [name], DO  Insulin Pen Needle 32G X 4 MM MISC 1 Device by Does not apply route in the morning, at noon, in the evening, and at bedtime. 01/03/20   [name], [name], MD  Insulin Syringe-Needle U-100 (BD INSULIN SYRINGE U/F) 31G X 5/16" 0.3 ML MISC Inject 1 each as directed 2 (two) times daily. as directed 11/02/19   Azzie Glatter, [name]  levothyroxine (SYNTHROID) 50 MCG tablet Take 1 tablet (50 mcg total) by mouth daily before breakfast. 11/29/20   Oswald Hillock, MD  LORazepam (ATIVAN) 0.5 MG tablet Take 1 tablet (0.5 mg total) by mouth every 4 (four) hours as needed for anxiety. May crush, mix  with water and give sublingually if needed. 10/22/20   [name], [name], DO  oxyCODONE (OXY IR/ROXICODONE) 5 MG immediate release tablet Take 1 tablet (5 mg total) by mouth every 4 (four) hours as needed for severe pain. May crush, mix with water and give sublingually if needed. 10/22/20   [name], [name], DO  prochlorperazine (COMPAZINE) 10 MG tablet Take 1 tablet (10 mg total) by mouth every 4 (four) hours as needed for nausea or vomiting. May crush, mix with water and give sublingually. Patient not taking: No sig reported 10/22/20   [name], [name], DO  sodium bicarbonate 650 MG tablet Take 1 tablet (650 mg total) by mouth 2 (two) times daily. Patient taking differently: Take 650 mg by mouth 2 (two) times daily with a meal. 04/19/20   [name], MD  vitamin B-12 1000 MCG tablet Take 1 tablet (1,000 mcg total) by mouth daily. Patient taking differently: Take 1,000 mcg by mouth daily with lunch. 12/05/19   [name], MD    Allergies    Patient has no known allergies.  Review of Systems   Review of Systems  Respiratory: Positive for shortness of breath.   All other systems reviewed and are negative.   Physical Exam Updated Vital Signs BP 121/63   Pulse 88   Temp (!) 97.3 F (36.3 C) (Oral)   Resp (!) 24   SpO2 95%   Physical Exam Vitals and nursing note reviewed.  Constitutional:      Appearance: He is well-developed.  HENT:     Head: Normocephalic and atraumatic.  Cardiovascular:     Rate and Rhythm: Normal rate.  Pulmonary:     Effort: Pulmonary effort is normal. No respiratory distress.  Abdominal:     General: There is no distension.  Musculoskeletal:        General: Normal range of motion.     Cervical back: Normal range of motion.  Neurological:     Mental Status: He is alert.     ED Results / Procedures / Treatments   Labs (all labs ordered are listed, but only abnormal results are displayed) Labs Reviewed  COMPREHENSIVE METABOLIC PANEL  CBC WITH  DIFFERENTIAL/PLATELET  BRAIN NATRIURETIC PEPTIDE  URINALYSIS, ROUTINE W REFLEX MICROSCOPIC    EKG EKG Interpretation  Date/Time:  Thursday December 20 2020 05:22:03 EST Ventricular Rate:  93 PR Interval:    QRS Duration: 104 QT Interval:  434 QTC Calculation: 540 R Axis:   19 Text Interpretation: Sinus rhythm Borderline T abnormalities, lateral leads Prolonged QT interval Confirmed by Merrily Pew [phone]) on 12/20/2020 6:44:03 AM  Radiology DG Chest Port 1 View  Result Date: 12/20/2020 CLINICAL DATA:  75 year old male with shortness of breath. EXAM: PORTABLE CHEST 1 VIEW COMPARISON:  Portable chest 12/02/2020 and earlier. FINDINGS: Portable AP semi upright view at 0523 hours. Similar patient rotation to the right. Veiling opacity throughout both lower lungs, mildly progressed on the right and stable on the left since last month. No superimposed pneumothorax. Upper lobe pulmonary vascularity appears stable to mildly improved from last month. No air bronchograms. Stable visible mediastinal contours. Visualized tracheal air column is within normal limits. Paucity of bowel gas in the upper abdomen. No acute osseous abnormality identified. IMPRESSION: Ongoing bilateral pleural effusions, moderate and progressed on the right since last month. Underlying pulmonary vascular congestion appears stable to mildly improved. Electronically Signed   By: Genevie Ann M.D.   On: 12/20/2020 05:29    Procedures Procedures   Medications Ordered in ED Medications  furosemide (LASIX) injection 80 mg (has no administration in time range)    ED Course  I have reviewed the triage vital signs and the nursing notes.  Pertinent labs & imaging results that were available during my care of the patient were reviewed by me and considered in my medical decision making (see chart for details).    MDM Rules/Calculators/A&P                         Patient's x-ray shows a slightly worsening pleural effusion however on  his 2 L of oxygen he is stable from a O2 and respiratory standpoint.  Rest of vital signs are within normal limits.  We will diurese a little based on his chest x-ray weight CBC CMP and BNP. Can likely be discharged if stable/improved and labs ok.  Care transferred pending labs, reevaluation and disposition.  Final Clinical Impression(s) / ED Diagnoses Final diagnoses:  None    Rx / DC Orders ED Discharge Orders    None       Crixus Mcaulay, Corene Cornea, MD 12/21/20 [phone]

## 2021-01-22 ENCOUNTER — Other Ambulatory Visit: Payer: Self-pay | Admitting: Internal Medicine

## 2021-01-24 ENCOUNTER — Ambulatory Visit: Payer: Medicare Other | Admitting: Internal Medicine

## 2021-01-28 IMAGING — CT CT HEAD W/O CM
3 series · 15 of 47 positions shown, 18 images · non-contrast
Comparison: January 27, 2019.

CLINICAL DATA: Altered mental status.

EXAM:
CT HEAD WITHOUT CONTRAST
TECHNIQUE: Contiguous axial images were obtained from the base of the skull
through the vertex without intravenous contrast.

[Series 3: head 5.0 h30s · axial · 0.43mm/px · z∈[-122,+13]mm · 9 of 33 slices shown, 12 images]
[im 3/33  brain]
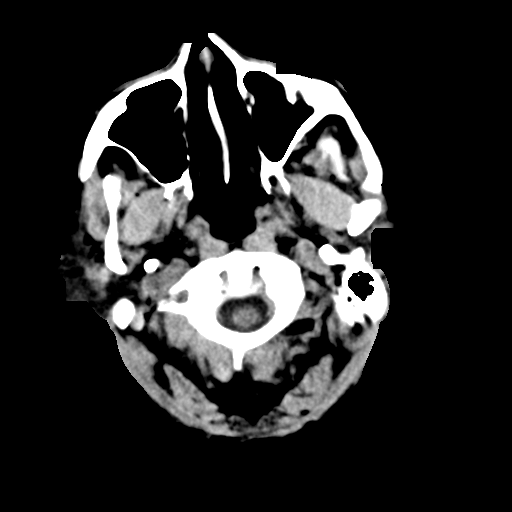
[im 3/33  bone]
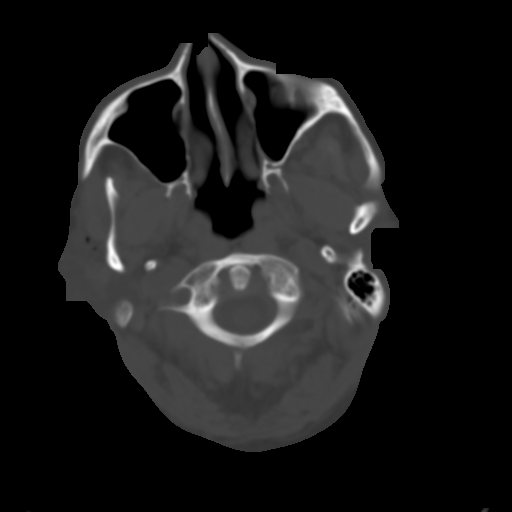
[im 6/33  brain]
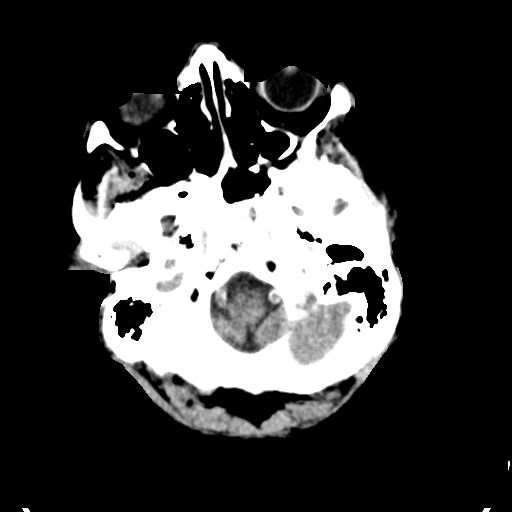
[im 9/33  brain]
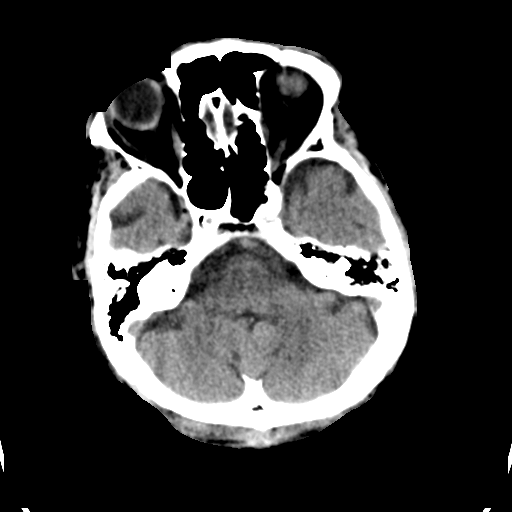
[im 13/33  brain]
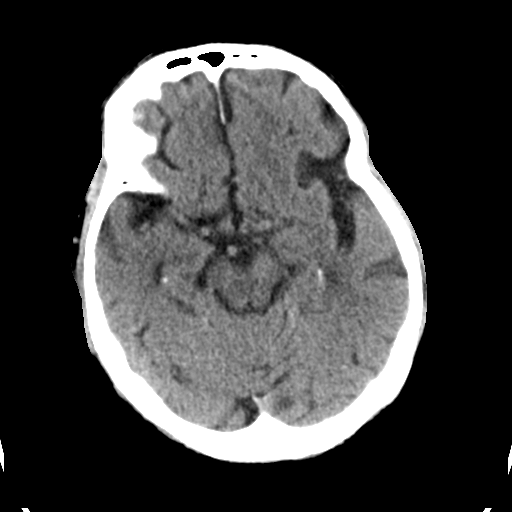
[im 17/33  brain]
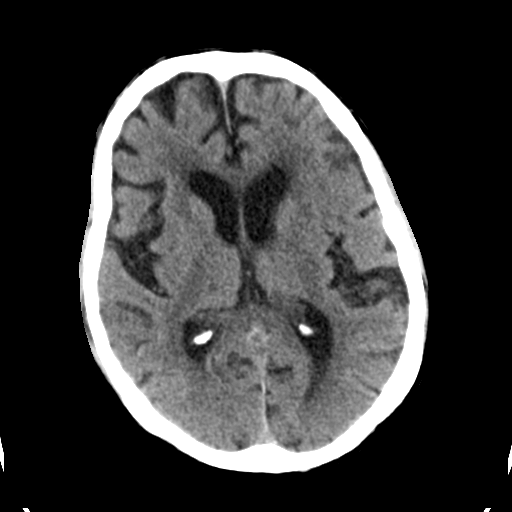
[im 17/33  bone]
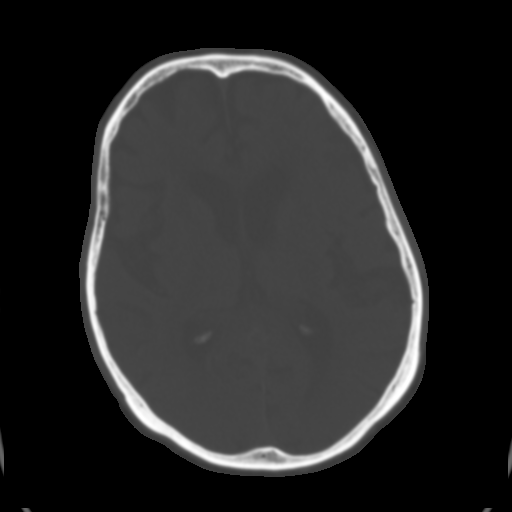
[im 20/33  brain]
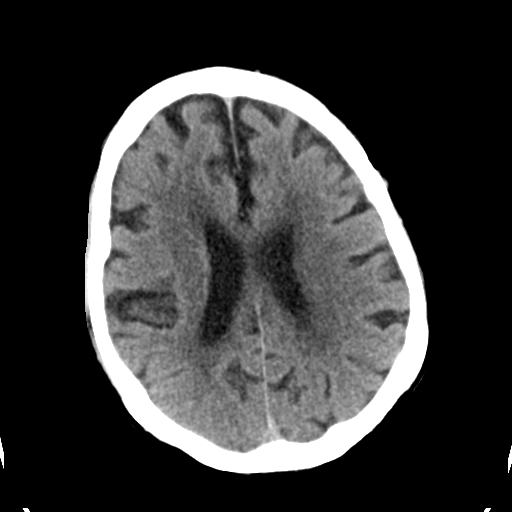
[im 24/33  brain]
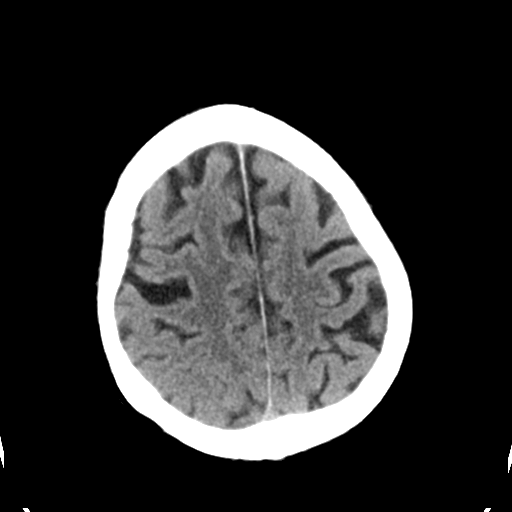
[im 27/33  brain]
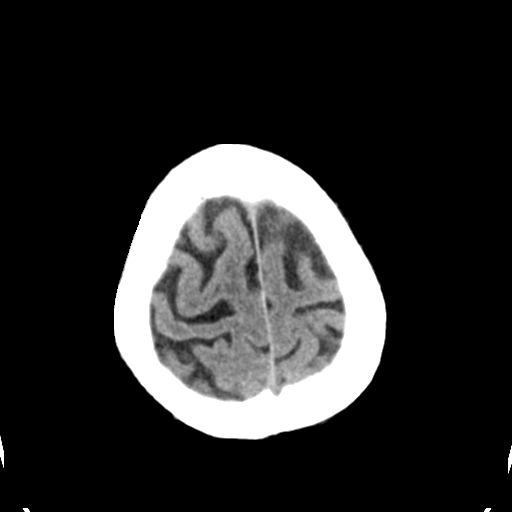
[im 30/33  brain]
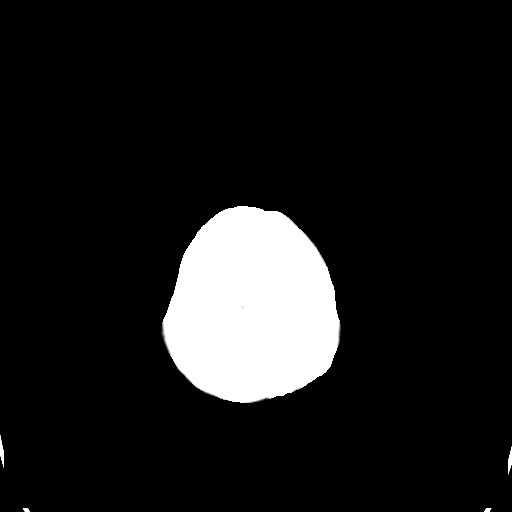
[im 30/33  bone]
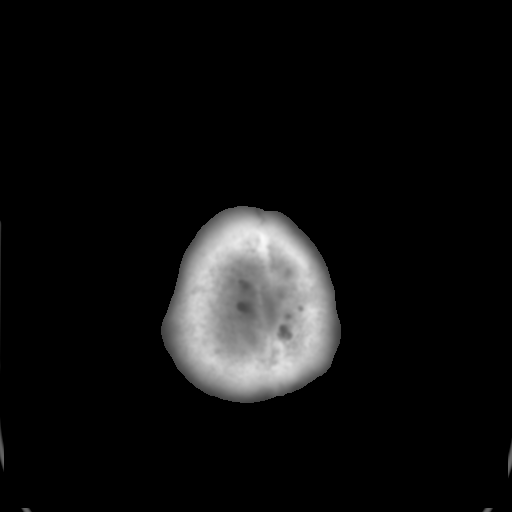

[Series 5: head 3.0 mpr cor · coronal · 0.32mm/px · 3 of 70 slices shown]
[im 24/70  brain]
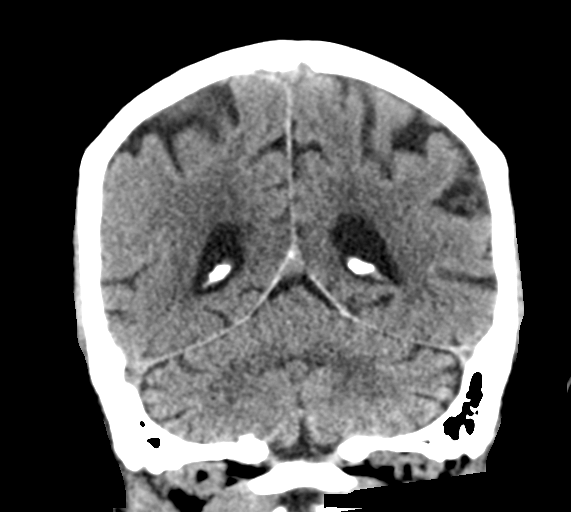
[im 31/70  brain]
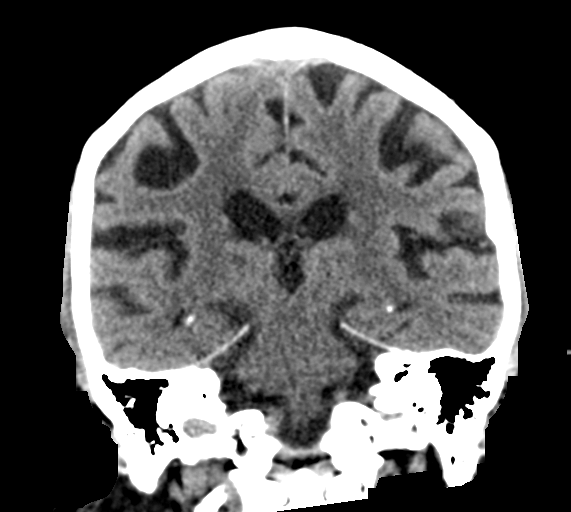
[im 39/70  brain]
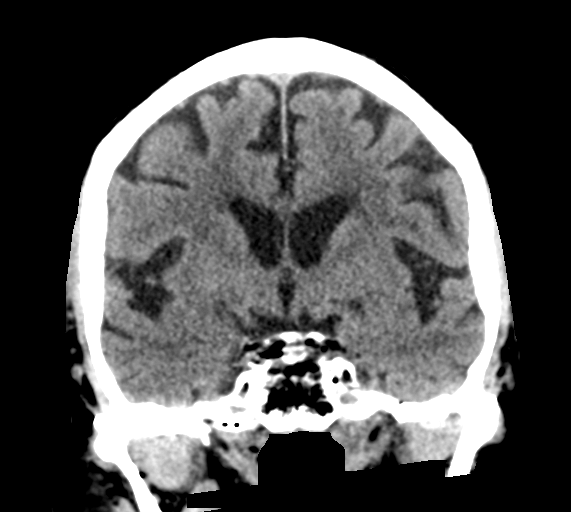

[Series 6: head 3.0 mpr sag · sagittal · 0.32mm/px · 3 of 57 slices shown]
[im 19/57  brain]
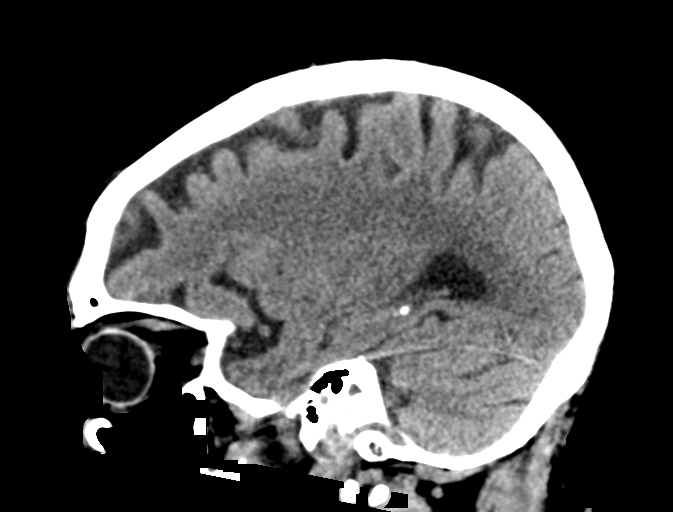
[im 29/57  brain]
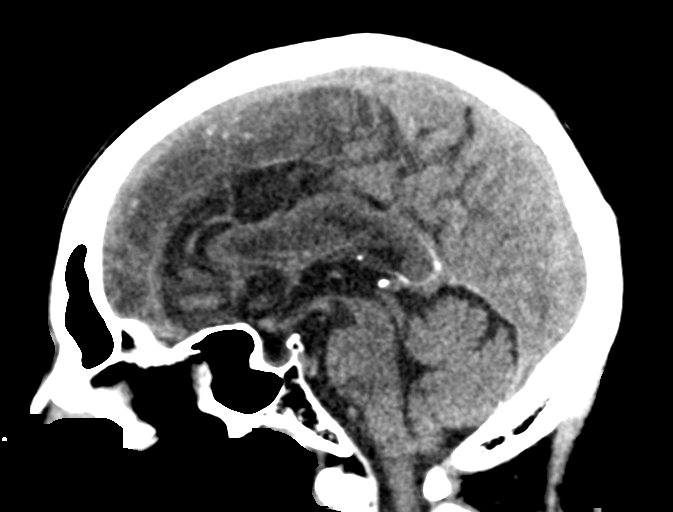
[im 38/57  brain]
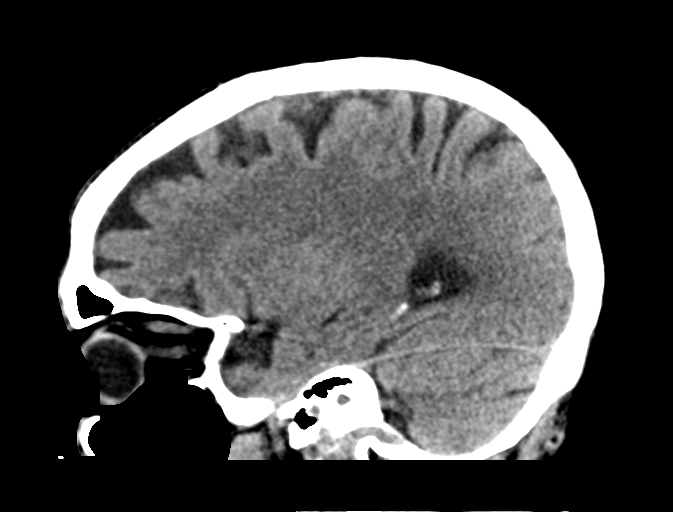

[15 of 47 positions shown; findings below may reference images not displayed]

FINDINGS: Brain: Mild diffuse cortical atrophy is noted. Mild chronic ischemic
white matter disease is noted. No mass effect or midline shift is
noted. Ventricular size is within normal limits. There is no
evidence of mass lesion, hemorrhage or acute infarction.

Vascular: No hyperdense vessel or unexpected calcification.

Skull: Normal. Negative for fracture or focal lesion.

Sinuses/Orbits: No acute finding.

Other: None.
IMPRESSION: Mild diffuse cortical atrophy. Mild chronic ischemic white matter
disease. No acute intracranial abnormality seen.

## 2021-01-28 IMAGING — DX DG CHEST 1V PORT
1 series · 1 of 1 positions shown · non-contrast
Comparison: 05/17/2019

CLINICAL DATA: Altered level of consciousness, leukocytosis

EXAM:
PORTABLE CHEST 1 VIEW

[chest]
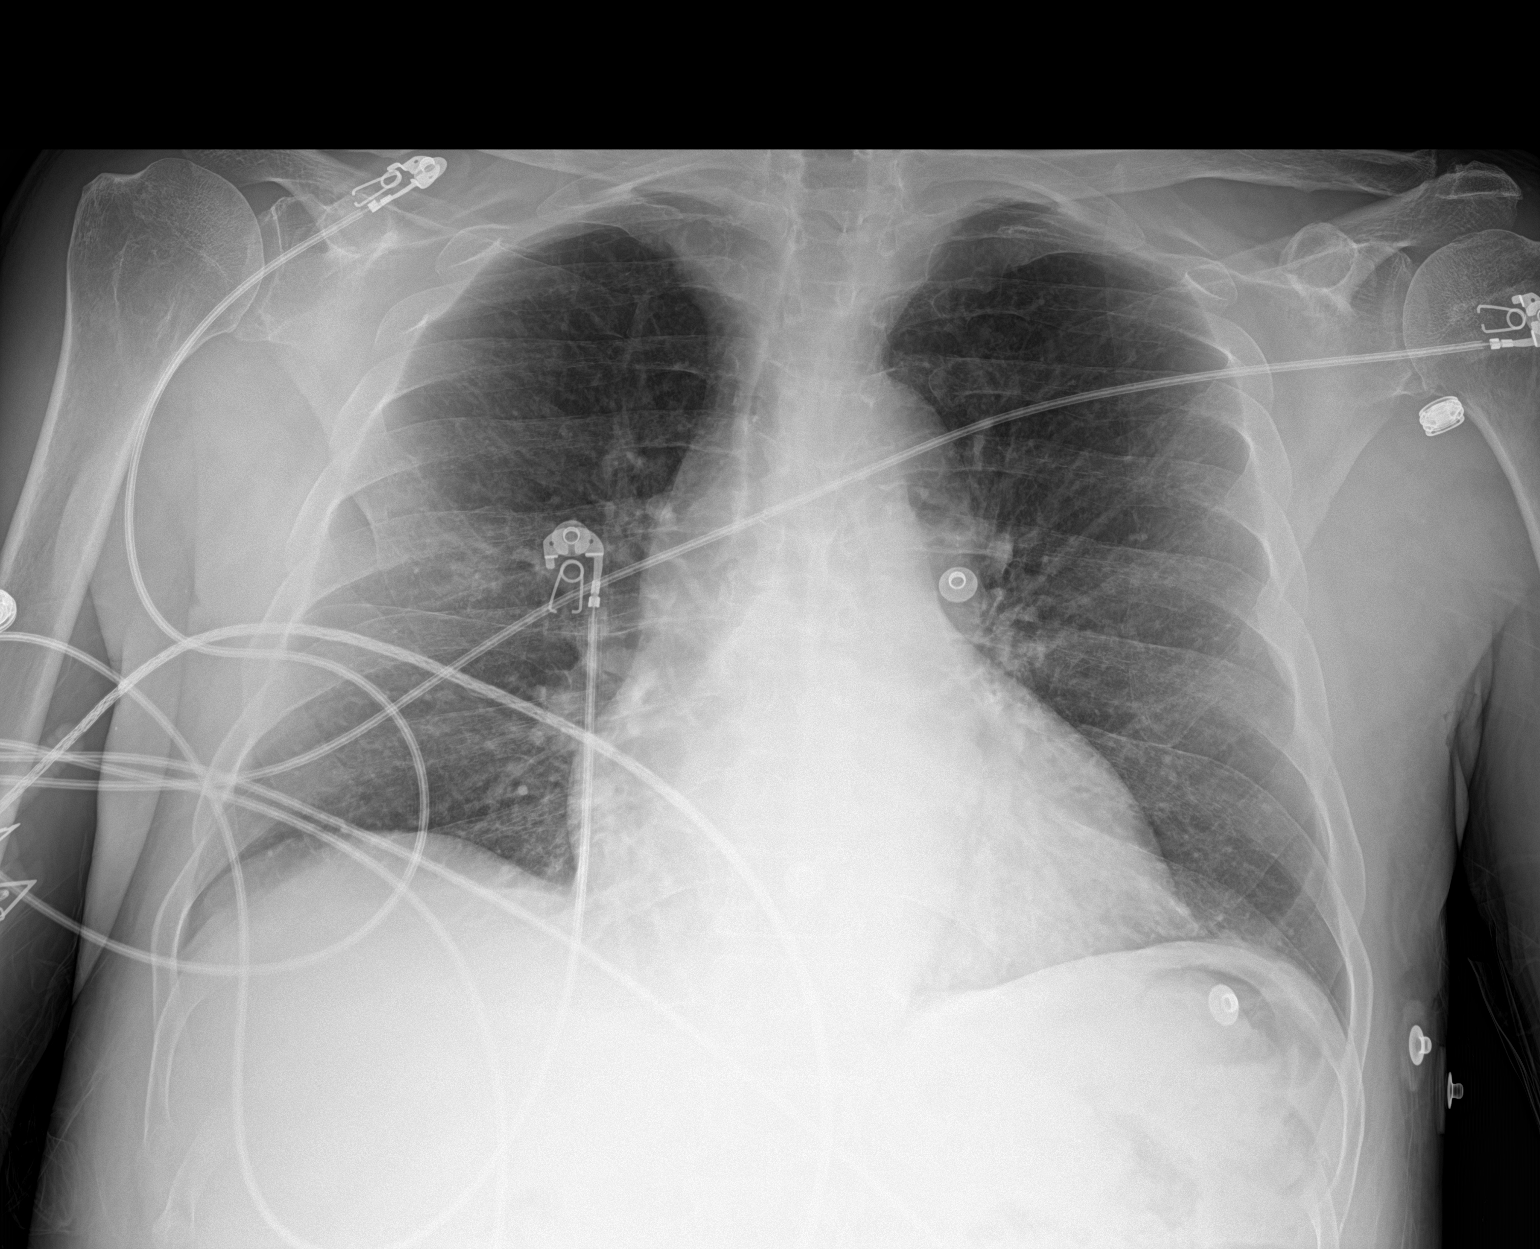

[1 of 1 positions shown; findings below may reference images not displayed]

FINDINGS: The heart size and mediastinal contours are within normal limits.
Both lungs are clear. The visualized skeletal structures are
unremarkable.
IMPRESSION: No active disease.

## 2021-02-03 IMAGING — DX DG CHEST 1V PORT
1 series · 1 of 1 positions shown · non-contrast
Comparison: November 22, 2019

CLINICAL DATA: Hypoglycemia

EXAM:
PORTABLE CHEST 1 VIEW

[chest ap]
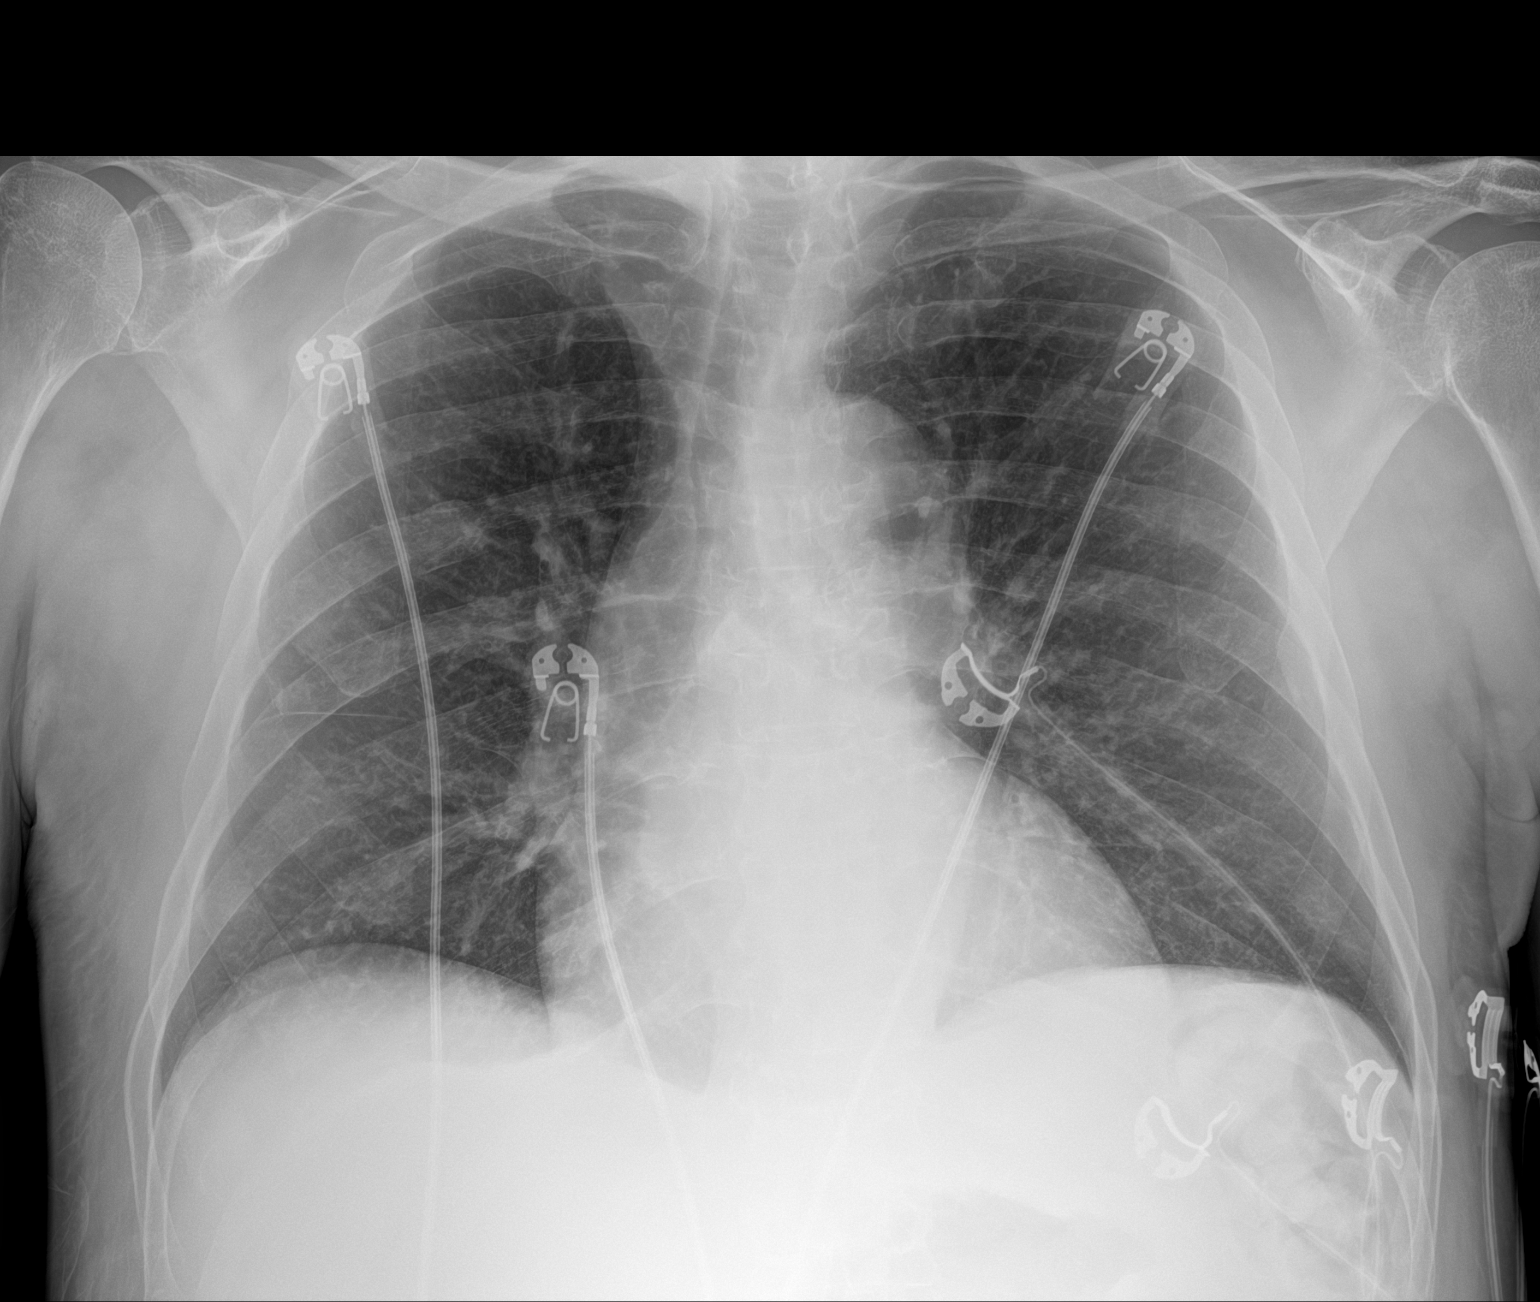

[1 of 1 positions shown; findings below may reference images not displayed]

FINDINGS: The lungs are clear. Heart is upper normal in size with pulmonary
vascularity normal. No adenopathy. No bone lesions.
IMPRESSION: Lungs clear.  Heart upper normal in size.  No adenopathy.

## 2021-02-14 ENCOUNTER — Inpatient Hospital Stay (HOSPITAL_COMMUNITY)
Admission: EM | Admit: 2021-02-14 | Discharge: 2021-02-16 | DRG: 951 | Disposition: A | Discharge status: Hospice | Attending: Internal Medicine | Admitting: Internal Medicine

## 2021-02-14 ENCOUNTER — Emergency Department (HOSPITAL_COMMUNITY)

## 2021-02-14 DIAGNOSIS — R0902 Hypoxemia: Secondary | ICD-10-CM | POA: Diagnosis present

## 2021-02-14 DIAGNOSIS — Z820 Family history of epilepsy and other diseases of the nervous system: Secondary | ICD-10-CM | POA: Diagnosis not present

## 2021-02-14 DIAGNOSIS — Z20822 Contact with and (suspected) exposure to covid-19: Secondary | ICD-10-CM | POA: Diagnosis present

## 2021-02-14 DIAGNOSIS — G9349 Other encephalopathy: Secondary | ICD-10-CM | POA: Diagnosis present

## 2021-02-14 DIAGNOSIS — E538 Deficiency of other specified B group vitamins: Secondary | ICD-10-CM | POA: Diagnosis present

## 2021-02-14 DIAGNOSIS — I130 Hypertensive heart and chronic kidney disease with heart failure and stage 1 through stage 4 chronic kidney disease, or unspecified chronic kidney disease: Secondary | ICD-10-CM | POA: Diagnosis present

## 2021-02-14 DIAGNOSIS — Z66 Do not resuscitate: Secondary | ICD-10-CM | POA: Diagnosis present

## 2021-02-14 DIAGNOSIS — Z83438 Family history of other disorder of lipoprotein metabolism and other lipidemia: Secondary | ICD-10-CM | POA: Diagnosis not present

## 2021-02-14 DIAGNOSIS — Z79899 Other long term (current) drug therapy: Secondary | ICD-10-CM

## 2021-02-14 DIAGNOSIS — N179 Acute kidney failure, unspecified: Secondary | ICD-10-CM | POA: Diagnosis present

## 2021-02-14 DIAGNOSIS — J9600 Acute respiratory failure, unspecified whether with hypoxia or hypercapnia: Secondary | ICD-10-CM | POA: Diagnosis not present

## 2021-02-14 DIAGNOSIS — E1122 Type 2 diabetes mellitus with diabetic chronic kidney disease: Secondary | ICD-10-CM | POA: Diagnosis present

## 2021-02-14 DIAGNOSIS — Z7189 Other specified counseling: Secondary | ICD-10-CM | POA: Diagnosis not present

## 2021-02-14 DIAGNOSIS — Z515 Encounter for palliative care: Principal | ICD-10-CM

## 2021-02-14 DIAGNOSIS — Z8673 Personal history of transient ischemic attack (TIA), and cerebral infarction without residual deficits: Secondary | ICD-10-CM | POA: Diagnosis not present

## 2021-02-14 DIAGNOSIS — R4182 Altered mental status, unspecified: Secondary | ICD-10-CM | POA: Diagnosis not present

## 2021-02-14 DIAGNOSIS — G9340 Encephalopathy, unspecified: Secondary | ICD-10-CM

## 2021-02-14 DIAGNOSIS — J9601 Acute respiratory failure with hypoxia: Secondary | ICD-10-CM | POA: Diagnosis present

## 2021-02-14 DIAGNOSIS — Z96641 Presence of right artificial hip joint: Secondary | ICD-10-CM | POA: Diagnosis present

## 2021-02-14 DIAGNOSIS — N184 Chronic kidney disease, stage 4 (severe): Secondary | ICD-10-CM | POA: Diagnosis present

## 2021-02-14 DIAGNOSIS — N390 Urinary tract infection, site not specified: Secondary | ICD-10-CM | POA: Diagnosis present

## 2021-02-14 DIAGNOSIS — Z8249 Family history of ischemic heart disease and other diseases of the circulatory system: Secondary | ICD-10-CM | POA: Diagnosis not present

## 2021-02-14 DIAGNOSIS — Z79890 Hormone replacement therapy (postmenopausal): Secondary | ICD-10-CM | POA: Diagnosis not present

## 2021-02-14 DIAGNOSIS — Z794 Long term (current) use of insulin: Secondary | ICD-10-CM

## 2021-02-14 DIAGNOSIS — I5023 Acute on chronic systolic (congestive) heart failure: Secondary | ICD-10-CM | POA: Diagnosis present

## 2021-02-14 DIAGNOSIS — Z789 Other specified health status: Secondary | ICD-10-CM | POA: Diagnosis not present

## 2021-02-14 LAB — RESP PANEL BY RT-PCR (FLU A&B, COVID) ARPGX2
Influenza A by PCR: NEGATIVE
Influenza B by PCR: NEGATIVE
SARS Coronavirus 2 by RT PCR: NEGATIVE

## 2021-02-14 MED ORDER — ONDANSETRON HCL 4 MG/2ML IJ SOLN: 4.0000 mg | Freq: Four times a day (QID) | INTRAMUSCULAR | Status: DC | PRN

## 2021-02-14 MED ORDER — ACETAMINOPHEN 325 MG PO TABS
650.0000 mg | ORAL_TABLET | Freq: Four times a day (QID) | ORAL | Status: DC | PRN
Start: 2021-02-14 — End: 2021-02-17

## 2021-02-14 MED ORDER — ACETAMINOPHEN 650 MG RE SUPP: 650.0000 mg | Freq: Four times a day (QID) | RECTAL | Status: DC | PRN

## 2021-02-14 MED ORDER — SODIUM CHLORIDE 0.9 % IV SOLN: 2.0000 g | Freq: Once | INTRAVENOUS | Status: DC

## 2021-02-14 MED ORDER — FUROSEMIDE 10 MG/ML IJ SOLN
20.0000 mg | Freq: Once | INTRAMUSCULAR | Status: AC
  Administered 2021-02-14: 20 mg via INTRAVENOUS
  Filled 2021-02-14: qty 2

## 2021-02-14 MED ORDER — VANCOMYCIN HCL 1500 MG/300ML IV SOLN
1500.0000 mg | Freq: Once | INTRAVENOUS | Status: DC
  Filled 2021-02-14: qty 300

## 2021-02-14 MED ORDER — LACTATED RINGERS IV BOLUS (SEPSIS): 500.0000 mL | Freq: Once | INTRAVENOUS | Status: DC

## 2021-02-14 MED ORDER — ONDANSETRON HCL 4 MG PO TABS: 4.0000 mg | ORAL_TABLET | Freq: Four times a day (QID) | ORAL | Status: DC | PRN

## 2021-02-14 NOTE — H&P (Addendum)
History and Physical    [name] [name] LGX:[id number] DOB: Jul 16, 1946 DOA: 02/14/2021  PCP: Libby Maw, MD (Confirm with patient/family/NH records and if not entered, this has to be entered at [name] point of entry) Patient coming from: Home  I have personally briefly reviewed patient's old medical records in [place]  Chief Complaint: AMS  HPI: [name] [name] is a 75 y.o. 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    Patient developed drowsiness and less responsive overnight, and family called EMS.  Family reported low-grade fever 100 last night, no cough.  Family denied any nauseous vomit diarrhea recently.  Patient only has minimal response, to painful stimuli.  ED Course: Patient was found to have deep hypoxia stabilized on 15 L via nonrebreather.  Chest x-ray showed pulmonary edema and worsening of bilateral pleural effusion.  ED physician discussed with patient's POA sister [name] [name], who desired patient on comfort care but no morphine or Ativan.  Review of Systems: Unable to perform patient obtunded  Past Medical History:  Diagnosis Date  . 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 Closed comminuted intertrochanteric fracture of left femur ([place])   . Diabetes mellitus without complication ([place])   . Diabetic hyperosmolar non-ketotic state ([place]) 05/15/2016  . DKA (diabetic ketoacidoses) 05/09/2016  . Hypertension   . Hypoglycemia 11/29/2019  . Postoperative anemia due to acute blood loss 06/18/2016  . Retroperitoneal hematoma 06/18/2016  . Stroke ([place])   . Vitamin B 12 deficiency 06/18/2016    Past Surgical History:  Procedure Laterality Date  . HIP ARTHROPLASTY Right 02/05/2013   Procedure: ARTHROPLASTY BIPOLAR HIP;  Surgeon: [name], MD;  Location: [facility];  Service: Orthopedics;  Laterality: Right;  . INTRAMEDULLARY (IM) NAIL  INTERTROCHANTERIC Left 06/16/2016   Procedure: INTRAMEDULLARY (IM) NAIL INTERTROCHANTRIC;  Surgeon: Rod Can, MD;  Location: [facility];  Service: Orthopedics;  Laterality: Left;     reports that he has never smoked. He has never used smokeless tobacco. He reports that he does not drink alcohol and does not use drugs.  Not on File  Family History  Problem Relation Age of Onset  . Alzheimer's disease Mother   . Hypertension Mother   . Hyperlipidemia Father   . Hypertension Father   . Healthy Maternal Grandmother   . Pneumonia Maternal Grandfather      Prior to Admission medications   Medication Sig Start Date End Date Taking? Authorizing Provider  ACCU-CHEK GUIDE test strip USE AS DIRECTED 06/12/20   Azzie Glatter, [name]  acetaminophen (TYLENOL) 325 MG tablet Take 2 tablets (650 mg total) by mouth every 6 (six) hours as needed for up to 30 doses for mild pain or moderate pain. Patient not taking: No sig reported 07/06/20   Wyvonnia Dusky, MD  albuterol (PROVENTIL) (2.5 MG/3ML) 0.083% nebulizer solution Take 2.5 mg by nebulization every 6 (six) hours as needed for shortness of breath or wheezing. 11/23/20   [provider]  amLODipine (NORVASC) 10 MG tablet Take 1 tablet (10 mg total) by mouth daily. 06/26/20   Libby Maw, MD  BD PEN NEEDLE NANO U/F 32G X 4 MM MISC USE TO INJECT HUMALOG AND LANTUS. 01/22/21   [name], [name], MD  Blood Glucose Monitoring Suppl (ACCU-CHEK GUIDE) w/Device KIT 1 Device by Does not apply route QID. 11/02/19   Azzie Glatter, [name]  calcium acetate (  PHOSLO) 667 MG capsule Take 2 capsules (1,334 mg total) by mouth 3 (three) times daily with meals. 10/22/20   [name], [name], DO  docusate sodium (COLACE) 100 MG capsule Take 1 capsule (100 mg total) by mouth 2 (two) times daily as needed for mild constipation. 12/06/20   Alma Friendly, MD  feeding supplement (ENSURE ENLIVE / ENSURE PLUS) LIQD Take 237 mLs by mouth 2 (two) times  daily between meals. Patient not taking: No sig reported 10/23/20   [name], [name], DO  furosemide (LASIX) 80 MG tablet Take 80 mg by mouth 2 (two) times daily with breakfast and lunch.    [provider]  glucose blood (ACCU-CHEK GUIDE) test strip Use as instructed 11/03/18   Azzie Glatter, [name]  glucose blood (TRUE METRIX BLOOD GLUCOSE TEST) test strip CHECK BLOOD SUGAR UP TO 4 TIMES A DAY. Patient taking differently: 1 each by Other route See admin instructions. CHECK BLOOD SUGAR UP TO 2 TIMES A DAY. 05/25/18   Azzie Glatter, [name]  hydrALAZINE (APRESOLINE) 100 MG tablet TAKE 1 TABLET BY MOUTH 3 TIMES A DAY. Patient taking differently: Take 100 mg by mouth 3 (three) times daily with meals. 07/30/20   Libby Maw, MD  hyoscyamine (LEVSIN SL) 0.125 MG SL tablet Place 1 tablet (0.125 mg total) under the tongue every 4 (four) hours as needed (excess oral secretions). 10/22/20   [name], [name], DO  Insulin Glargine (BASAGLAR KWIKPEN) 100 UNIT/ML Inject 5 Units into the skin at bedtime. 08/20/20   [name], DO  Insulin Glargine (BASAGLAR KWIKPEN) 100 UNIT/ML INJECT 5 UNITS UNDER THE SKIN AT BEDTIME. 10/31/20 10/31/21  Hertweck, Elenore Rota, MD  insulin lispro (HUMALOG KWIKPEN) 100 UNIT/ML KwikPen Inject 0-10 Units into the skin 3 (three) times daily as needed (blood sugar). Max daily 30 units Patient taking differently: Inject 1-10 Units into the skin 3 (three) times daily as needed (high blood sugar, CBG >120). Max daily 30 units 08/20/20   [name], DO  insulin lispro (HUMALOG) 100 UNIT/ML KwikPen INJECT UNDER THE SKIN PER SLIDING SCALE. MAX 30 UNITS DAILY. 10/31/20 10/31/21  Hertweck, Elenore Rota, MD  Insulin Pen Needle 31G X 5 MM MISC USE UP TO 4 NEEDLES PER DAY WITH BASAGLAR & HUMALOG 10/31/20 10/31/21  Hertweck, Elenore Rota, MD  Insulin Syringe-Needle U-100 (BD INSULIN SYRINGE U/F) 31G X 5/16" 0.3 ML MISC Inject 1 each as directed 2 (two) times daily. as directed 11/02/19   Azzie Glatter,  [name]  levothyroxine (SYNTHROID) 50 MCG tablet Take 1 tablet (50 mcg total) by mouth daily before breakfast. 11/29/20   Oswald Hillock, MD  LORazepam (ATIVAN) 0.5 MG tablet Take 1 tablet (0.5 mg total) by mouth every 4 (four) hours as needed for anxiety. May crush, mix with water and give sublingually if needed. 10/22/20   [name], [name], DO  oxyCODONE (OXY IR/ROXICODONE) 5 MG immediate release tablet Take 1 tablet (5 mg total) by mouth every 4 (four) hours as needed for severe pain. May crush, mix with water and give sublingually if needed. 10/22/20   [name], [name], DO  prochlorperazine (COMPAZINE) 10 MG tablet Take 1 tablet (10 mg total) by mouth every 4 (four) hours as needed for nausea or vomiting. May crush, mix with water and give sublingually. Patient not taking: No sig reported 10/22/20   [name], [name], DO  sodium bicarbonate 650 MG tablet Take 1 tablet (650 mg total) by mouth 2 (two) times daily. Patient taking differently: Take 650 mg by  mouth 2 (two) times daily with a meal. 04/19/20   [name], MD  vitamin B-12 1000 MCG tablet Take 1 tablet (1,000 mcg total) by mouth daily. Patient taking differently: Take 1,000 mcg by mouth daily with lunch. 12/05/19   [name], MD    Physical Exam: Vitals:   02/14/21 1149  BP: (!) 92/50  Pulse: 80  Resp: (!) 24  Temp: 98.4 F (36.9 C)  TempSrc: Temporal  SpO2: 100%    Constitutional: NAD, calm, comfortable Vitals:   02/14/21 1149  BP: (!) 92/50  Pulse: 80  Resp: (!) 24  Temp: 98.4 F (36.9 C)  TempSrc: Temporal  SpO2: 100%   Eyes: PERRL, lids and conjunctivae normal ENMT: Mucous membranes are moist. Posterior pharynx clear of any exudate or lesions.Normal dentition.  Neck: normal, supple, no masses, no thyromegaly Respiratory: Lungs are congested on auscultation bilaterally, no wheezing, diffused bilateral crackles.  Increasing respiratory effort.  Positive signs of use accessory muscle use.  Cardiovascular: Regular rate and rhythm, no  murmurs / rubs / gallops. 2+ extremity edema. 2+ pedal pulses. No carotid bruits.  Abdomen: no tenderness, no masses palpated. No hepatosplenomegaly. Bowel sounds positive.  Musculoskeletal: no clubbing / cyanosis. No joint deformity upper and lower extremities. Good ROM, no contractures. Normal muscle tone.  Skin: no rashes, lesions, ulcers. No induration Neurologic: Very little response, open eyes for pain stimulus Psychiatric: Unable to evaluate    Labs on Admission: I have personally reviewed following labs and imaging studies  CBC: No results for input(s): WBC, NEUTROABS, HGB, HCT, MCV, PLT in the last 168 hours. Basic Metabolic Panel: No results for input(s): NA, K, CL, CO2, GLUCOSE, BUN, CREATININE, CALCIUM, MG, PHOS in the last 168 hours. GFR: CrCl cannot be calculated (Patient's most recent lab result is older than the maximum 21 days allowed.). Liver Function Tests: No results for input(s): AST, ALT, ALKPHOS, BILITOT, PROT, ALBUMIN in the last 168 hours. No results for input(s): LIPASE, AMYLASE in the last 168 hours. No results for input(s): AMMONIA in the last 168 hours. Coagulation Profile: No results for input(s): INR, PROTIME in the last 168 hours. Cardiac Enzymes: No results for input(s): CKTOTAL, CKMB, CKMBINDEX, TROPONINI in the last 168 hours. BNP (last 3 results) No results for input(s): PROBNP in the last 8760 hours. HbA1C: No results for input(s): HGBA1C in the last 72 hours. CBG: No results for input(s): GLUCAP in the last 168 hours. Lipid Profile: No results for input(s): CHOL, HDL, LDLCALC, TRIG, CHOLHDL, LDLDIRECT in the last 72 hours. Thyroid Function Tests: No results for input(s): TSH, T4TOTAL, FREET4, T3FREE, THYROIDAB in the last 72 hours. Anemia Panel: No results for input(s): VITAMINB12, FOLATE, FERRITIN, TIBC, IRON, RETICCTPCT in the last 72 hours. Urine analysis:    Component Value Date/Time   COLORURINE YELLOW 12/02/2020 1630   APPEARANCEUR  CLEAR 12/02/2020 1630   LABSPEC 1.013 12/02/2020 1630   PHURINE 6.0 12/02/2020 1630   GLUCOSEU >=500 (A) 12/02/2020 1630   GLUCOSEU >=1000 (A) 12/08/2019 1139   HGBUR SMALL (A) 12/02/2020 1630   BILIRUBINUR NEGATIVE 12/02/2020 1630   BILIRUBINUR Negative 11/09/2019 0910   KETONESUR 5 (A) 12/02/2020 1630   PROTEINUR >=300 (A) 12/02/2020 1630   UROBILINOGEN 0.2 12/08/2019 1139   NITRITE NEGATIVE 12/02/2020 1630   LEUKOCYTESUR NEGATIVE 12/02/2020 1630    Radiological Exams on Admission: DG Chest Port 1 View  Result Date: 02/14/2021 CLINICAL DATA:  Sepsis EXAM: PORTABLE CHEST 1 VIEW COMPARISON:  12/20/2020 FINDINGS: Stable cardiomegaly. Atherosclerotic  calcification of the aortic knob. Persistent moderate bilateral pleural effusions, right greater than left. Mild pulmonary vascular congestion. Hazy bibasilar opacities. No pneumothorax. IMPRESSION: Cardiomegaly with pulmonary vascular congestion and moderate bilateral pleural effusions, right greater than left. Overall, findings are similar to the previous study. Electronically Signed   By: Davina Poke D.O.   On: 02/14/2021 14:23    EKG: Independently reviewed. Sinus, no ST-T issue  Assessment/Plan Active Problems:   Hypoxia  (please populate well all problems here in Problem List. (For example, if patient is on BP meds at home and you resume or decide to hold them, it is a problem that needs to be her. Same for CAD, COPD, HLD and so on)  Acute hypoxic respite failure -Continue oxygen support.  Acute hypoxic and uremic encephalopathy -Extensive discussion with patient family including POA/sister, daughter and niece at bedside, all parties agreed that worsening of patient's breathing status came from fluid overload, pulmonary edema and increasing bilateral pleural effusion, and the only treatment will be diuresis and hemodialysis.  Family maintained patient not a candidate for hemodialysis, thus I decide to treat with Lasix and see  response.  -Also discussed about the concern of sepsis from the family given the patient had fever last night.  Given the baseline problem of CKD and CHF, as well as worsening of hypoxia, any fluid resuscitation and IV antibiotics likely be only precipitate problem of fluid overload which already life threatening.  All parties including POA/sister, daughter and niece agreed with no further IV fluid or antibiotics, or sepsis work-up. -Family for the recent concern about use of morphine, I explained to them about [facility] morphine and his case is to ease respiratory distress and make patient less struggling with breathing failure, which is in line with goal of care/hospice.  Family however concerned about respiratory depression from morphine, and not liquid with use of morphine.  Discussed with hospice team, will not start morphine at this time. -Discussed with hospice team regarding conversation with family, hospice team will further manage comfort care measures. -1 dose of Lasix given.  Acute on chronic systolic CHF decompensation -As above  AKI on CKD CKD stage IV -Not a candidate for HD.  DVT prophylaxis: No chemical DVT prophylaxis due to hospice status. Code Status: DNR Family Communication: Sister [name] [name] over the phone, daughter and niece at bedside Disposition Plan: Expect 1 to 2 days hospital stay bridging for inpatient hospice versus home hospice. Consults called: Palliative care Admission status: MedSurg   Lequita Halt MD [facility] Pager [phone]  02/14/2021, 2:47 PM

## 2021-02-14 NOTE — Consult Note (Signed)
Consultation Note Date: 02/14/2021   Patient Name: [name] [name]  DOB: 13-Aug-1946  MRN: [id number]  Age / Sex: 75 y.o., male  PCP: Libby Maw, MD Referring Physician: Lucrezia Starch, MD  Reason for Consultation: goals of care, "comfort"  HPI/Patient Profile: 75 y.o. male  with past medical history of chronic systolic CHF, CKD stage IV, HTN, insulin-dependent DM, and CVA. He presented to the emergency department on 02/14/2021 with altered mental status. Per family, patient developed drowsiness and became less responsive overnight so they called EMS. Family also reporting a low-grade fever of 100 last night. In the ED, patient was found to be hypoxic and placed on 15L NRB. Chest x-ray showed pulmonary edema and worsening bilateral pleural effusion. He was admitted to [facility] with acute hypoxic respiratory failure and acute encephalopathy.   Clinical Assessment and Goals of Care: I have reviewed medical records including EPIC notes, labs and imaging, examined the patient and discussed case with ED provider Dr. Roslynn Amble.   At bedside, patient is minimally responsive. Daughter and niece are at bedside. They state that the family wishes him to be comfortable. They share that patient's sister [name] [name] is the ultimate decision maker for the family and request that I call her. They share that [name] [name] is "optimistic" about his current medical situation, as he has been like this before and "bounced back".   I spoke with [name] [name] to discuss diagnosis, prognosis, GOC, EOL wishes, disposition, and options. Patient is known to PMT from previous admissions. I re-introduced Palliative Medicine as specialized medical care for people living with serious illness. It focuses on providing relief from the symptoms and stress of a serious illness.   Patient lives at home with his sisters, [name] [name] and [name] [name]. [name] [name] confirms that patient is actively  under the care of [place] and family wants to continue this at discharge. We discussed his current illness and what it means in the larger context of his ongoing co-morbidities.  Natural disease trajectory at EOL was discussed. I verbalized concern that patient may be actively dying. [name] [name] states that he has been in a similar condition before and has pulled through. I expressed concern that he would likely not improve from this state.   Faye verbalizes her awareness of patient's decline, however is hopeful to have more time with him. She expresses her wish to treat the treatable while patient is in the hospital. She feels he needs to be treated for an infection and states he has been prone to respiratory infections. She also feels he is dehydrated and wants him to receive IV fluids.   [name] [name] expresses that the family feels very strongly that patient does not receive morphine or other pain medications. She feels this hastens the process and family wants to allow "nature to take it's course". I reported that patient seemed comfortable at this time, but that if he started showing signs of discomfort we would need to re-discuss this issue.   13:10--I notified Dr. Roslynn Amble know that [name] [name] is wanting to pursue treatment  with antibiotics and IV fluids.   13:30--I saw Dr. Roosevelt Locks in the hallway outside patient's room. He states that chest x-ray is showing pulmonary edema and that he has discussed with family at bedside and they have agreed to comfort measures only. I let him know he also needs to speak with [name] [name] as she has been the main decision maker for the family.   14:49--Confirmed with Dr. Roosevelt Locks that he had spoken with [name] [name] and explained that IV fluids and antibiotics were not appropriate and could cause harm since patient is currently fluid overloaded.   19:15--I spoke with [name] [name] by phone and let her know patient was being given IV lasix to help treat the pulmonary edema. Discussed that this was  consistent with comfort measures as it could help relieve dyspnea. [name] [name] expresses that she would like to get her brother back home if possible. Discussed that we would monitor him overnight and reassess his stability for transfer in the morning.    Primary decision maker: [name] [name], sister    SUMMARY OF RECOMMENDATIONS    DNR/DNI as previously documented  Continue current gentle medical care - oxygen, cardiac monitoring, etc  Family is adamantly opposed to use of morphine   If patient develops pain and/or dyspnea, would recommend use of low-dose IV Fentanyl  Goal of care is for patient to return home if he is stable for transport  PMT will continue to follow  Code Status/Advance Care Planning:  DNR  Palliative Prophylaxis:   Oral Care and Turn Reposition   Psycho-social/Spiritual:   Created space and opportunity for family to express thoughts and feelings regarding patient's current medical situation.   Emotional support provided    Prognosis:   Days   Discharge Planning: To be determined, goal is home with hospice      Primary Diagnoses: Present on Admission: **None**   I have reviewed the medical record, interviewed the patient and family, and examined the patient. The following aspects are pertinent.  Past Medical History:  Diagnosis Date  . 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 Closed comminuted intertrochanteric fracture of left femur ([place])   . Diabetes mellitus without complication ([place])   . Diabetic hyperosmolar non-ketotic state ([place]) 05/15/2016  . DKA (diabetic ketoacidoses) 05/09/2016  . Hypertension   . Hypoglycemia 11/29/2019  . Postoperative anemia due to acute blood loss 06/18/2016  . Retroperitoneal hematoma 06/18/2016  . Stroke ([place])   . Vitamin B 12 deficiency 06/18/2016   Social History   Socioeconomic History  . Marital status: Single    Spouse name: Not on file  . Number of children:  2  . Years of education: 61  . Highest education level: Not on file  Occupational History  . Occupation: Retired  Tobacco Use  . Smoking status: Never Smoker  . Smokeless tobacco: Never Used  Vaping Use  . Vaping Use: Never used  Substance and Sexual Activity  . Alcohol use: No  . Drug use: No  . Sexual activity: Not Currently  Other Topics Concern  . Not on file  Social History Narrative   Fun: Likes to do handyman related projects, photography.    Denies religious beliefs effecting health care.    Social Determinants of Health   Financial Resource Strain: Low Risk   . Difficulty of Paying Living Expenses: Not very hard  Food Insecurity: Food Insecurity Present  . Worried About Charity fundraiser in the Last Year: Sometimes true  .  Ran Out of Food in the Last Year: Never true  Transportation Needs: No Transportation Needs  . Lack of Transportation (Medical): No  . Lack of Transportation (Non-Medical): No  Physical Activity: Inactive  . Days of Exercise per Week: 0 days  . Minutes of Exercise per Session: 0 min  Stress: No Stress Concern Present  . Feeling of Stress : Not at all  Social Connections: Socially Isolated  . Frequency of Communication with Friends and Family: More than three times a week  . Frequency of Social Gatherings with Friends and Family: More than three times a week  . Attends Religious Services: Never  . Active Member of Clubs or Organizations: No  . Attends Archivist Meetings: Never  . Marital Status: Never married   Family History  Problem Relation Age of Onset  . Alzheimer's disease Mother   . Hypertension Mother   . Hyperlipidemia Father   . Hypertension Father   . Healthy Maternal Grandmother   . Pneumonia Maternal Grandfather    Scheduled Meds: Continuous Infusions: . ceFEPime (MAXIPIME) IV    . lactated ringers    . vancomycin     PRN Meds:. Medications Prior to Admission:  Prior to Admission medications   Medication  Sig Start Date End Date Taking? Authorizing Provider  ACCU-CHEK GUIDE test strip USE AS DIRECTED 06/12/20   Azzie Glatter, [name]  acetaminophen (TYLENOL) 325 MG tablet Take 2 tablets (650 mg total) by mouth every 6 (six) hours as needed for up to 30 doses for mild pain or moderate pain. Patient not taking: No sig reported 07/06/20   Wyvonnia Dusky, MD  albuterol (PROVENTIL) (2.5 MG/3ML) 0.083% nebulizer solution Take 2.5 mg by nebulization every 6 (six) hours as needed for shortness of breath or wheezing. 11/23/20   [provider]  amLODipine (NORVASC) 10 MG tablet Take 1 tablet (10 mg total) by mouth daily. 06/26/20   Libby Maw, MD  BD PEN NEEDLE NANO U/F 32G X 4 MM MISC USE TO INJECT HUMALOG AND LANTUS. 01/22/21   [name], [name], MD  Blood Glucose Monitoring Suppl (ACCU-CHEK GUIDE) w/Device KIT 1 Device by Does not apply route QID. 11/02/19   Azzie Glatter, [name]  calcium acetate (PHOSLO) 667 MG capsule Take 2 capsules (1,334 mg total) by mouth 3 (three) times daily with meals. 10/22/20   [name], [name], DO  docusate sodium (COLACE) 100 MG capsule Take 1 capsule (100 mg total) by mouth 2 (two) times daily as needed for mild constipation. 12/06/20   Alma Friendly, MD  feeding supplement (ENSURE ENLIVE / ENSURE PLUS) LIQD Take 237 mLs by mouth 2 (two) times daily between meals. Patient not taking: No sig reported 10/23/20   [name], [name], DO  furosemide (LASIX) 80 MG tablet Take 80 mg by mouth 2 (two) times daily with breakfast and lunch.    [provider]  glucose blood (ACCU-CHEK GUIDE) test strip Use as instructed 11/03/18   Azzie Glatter, [name]  glucose blood (TRUE METRIX BLOOD GLUCOSE TEST) test strip CHECK BLOOD SUGAR UP TO 4 TIMES A DAY. Patient taking differently: 1 each by Other route See admin instructions. CHECK BLOOD SUGAR UP TO 2 TIMES A DAY. 05/25/18   Azzie Glatter, [name]  hydrALAZINE (APRESOLINE) 100 MG tablet TAKE 1 TABLET BY MOUTH 3  TIMES A DAY. Patient taking differently: Take 100 mg by mouth 3 (three) times daily with meals. 07/30/20   Libby Maw, MD  hyoscyamine (  LEVSIN SL) 0.125 MG SL tablet Place 1 tablet (0.125 mg total) under the tongue every 4 (four) hours as needed (excess oral secretions). 10/22/20   [name], [name], DO  Insulin Glargine (BASAGLAR KWIKPEN) 100 UNIT/ML Inject 5 Units into the skin at bedtime. 08/20/20   [name], DO  Insulin Glargine (BASAGLAR KWIKPEN) 100 UNIT/ML INJECT 5 UNITS UNDER THE SKIN AT BEDTIME. 10/31/20 10/31/21  Hertweck, Elenore Rota, MD  insulin lispro (HUMALOG KWIKPEN) 100 UNIT/ML KwikPen Inject 0-10 Units into the skin 3 (three) times daily as needed (blood sugar). Max daily 30 units Patient taking differently: Inject 1-10 Units into the skin 3 (three) times daily as needed (high blood sugar, CBG >120). Max daily 30 units 08/20/20   [name], DO  insulin lispro (HUMALOG) 100 UNIT/ML KwikPen INJECT UNDER THE SKIN PER SLIDING SCALE. MAX 30 UNITS DAILY. 10/31/20 10/31/21  Hertweck, Elenore Rota, MD  Insulin Pen Needle 31G X 5 MM MISC USE UP TO 4 NEEDLES PER DAY WITH BASAGLAR & HUMALOG 10/31/20 10/31/21  Hertweck, Elenore Rota, MD  Insulin Syringe-Needle U-100 (BD INSULIN SYRINGE U/F) 31G X 5/16" 0.3 ML MISC Inject 1 each as directed 2 (two) times daily. as directed 11/02/19   Azzie Glatter, [name]  levothyroxine (SYNTHROID) 50 MCG tablet Take 1 tablet (50 mcg total) by mouth daily before breakfast. 11/29/20   Oswald Hillock, MD  LORazepam (ATIVAN) 0.5 MG tablet Take 1 tablet (0.5 mg total) by mouth every 4 (four) hours as needed for anxiety. May crush, mix with water and give sublingually if needed. 10/22/20   [name], [name], DO  oxyCODONE (OXY IR/ROXICODONE) 5 MG immediate release tablet Take 1 tablet (5 mg total) by mouth every 4 (four) hours as needed for severe pain. May crush, mix with water and give sublingually if needed. 10/22/20   [name], [name], DO  prochlorperazine (COMPAZINE) 10 MG tablet Take 1  tablet (10 mg total) by mouth every 4 (four) hours as needed for nausea or vomiting. May crush, mix with water and give sublingually. Patient not taking: No sig reported 10/22/20   [name], [name], DO  sodium bicarbonate 650 MG tablet Take 1 tablet (650 mg total) by mouth 2 (two) times daily. Patient taking differently: Take 650 mg by mouth 2 (two) times daily with a meal. 04/19/20   [name], MD  vitamin B-12 1000 MCG tablet Take 1 tablet (1,000 mcg total) by mouth daily. Patient taking differently: Take 1,000 mcg by mouth daily with lunch. 12/05/19   [name], MD   Not on File Review of Systems  Unable to perform ROS: Patient nonverbal    Physical Exam Vitals reviewed.  Constitutional:      General: He is not in acute distress.    Appearance: He is ill-appearing.  Cardiovascular:     Rate and Rhythm: Normal rate.  Pulmonary:     Effort: Pulmonary effort is normal.  Neurological:     Comments: Minimally responsive  Psychiatric:        Speech: He is noncommunicative.     Vital Signs: BP (!) 92/50 (BP Location: Right Arm)   Pulse 80   Temp 98.4 F (36.9 C) (Temporal)   Resp (!) 24   SpO2 100%  Pain Scale: CPOT       SpO2: SpO2: 100 % O2 Device:SpO2: 100 % O2 Flow Rate: .   IO: Intake/output summary: No intake or output data in the 24 hours ending 02/14/21 1315     Palliative Assessment/Data: PPS  10%     Time In: 1230 Time Out: 1342 Time Total: 72 minutes Greater than 50%  of this time was spent counseling and coordinating care related to the above assessment and plan.  Signed by: Lavena Bullion, NP   Please contact Palliative Medicine Team phone at [phone] for questions and concerns.  For individual provider: See Shea Evans

## 2021-02-14 NOTE — Progress Notes (Signed)
[facility])      This patient is an active hospice pt on service since Oct 24, 2020, with a terminal dx of ESRD.   Please see below for handoff report from pt's RN case manager:  "Patient's sisters, [name] and Hussein Spillman, have decided to send patient to [facility] ED. I really feel that patient is active, but they are not ready. They want fluids, antibiotics, and want to feel as though they have done all they can do to keep patient alive. [name] has expressed that patient is a "bounce backer" and feels that if he goes to the hospital he will return to his previous state. Dr. Tomasa Hosteller was willing to order antibiotics per the family request, but they did not want to wait.   EOL is a very sensitive subject for this family. They are very much against giving him Morphine- FYI."  [place] liaisons will continue to follow. Thank you for the opportunity to participate in this patient's care.     Domenic Moras, BSN, RN [facility] Liaison [phone] (24h on call)

## 2021-02-14 NOTE — ED Triage Notes (Signed)
Patient BIB [facility] from home for decreased level of consciousness, GCS 8 per EMS. Patient is on hospice for terminal ESRD. Currently spontaneously breathing on NRB at 15L/min. Patient responds to painful stimuli.

## 2021-02-14 NOTE — Progress Notes (Signed)
[facility] [id number] - [facility]) - Hospitalized patient note:   Mr. 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 Patient was transported to [facility] ER per family request for evaluation after experiencing fever and congestion in the home. Hospice RN was notified via telephone and attempted to assist family in managing symptoms in the home while offering a home visit - Family elected to initiate EMS at that time. Patient is being admitted with Hypoxia and per Dr. Tomasa Hosteller of [facility] this a related admission. Patient is a DNR.   Spoke with ER MD who stated that patient is very hypoxic on 15L/min Oxygen via nonrebreather and family desires comfort care with no morphine or Ativan at this time. Spoke to patient family members outside of ER room to support and listen. Family is concerned about the effect Morphine and Ativan have on the patient and wish to avoid these medications after a prior experience where patient seemed to decline drastically after administration - encouraged expression of feelings. Discussed the EOL symptoms, medications and hospice philosophy with family members present (niece and daughter) who are not the decision makers. Unable to reach patient sister [facility]) at this time.  Visited patient at bedside in the ER, Mr. Warlick was minimally responsive, noted with shallow labored breaths with accessory muscle use and audible congestion, edema present in BUE and is currently on 15L/min oxygen via nonrebreather mask.   V/S:  92/50, 80, 24, 98.4, 100% O2 sats on 15L/min via NRB mask I&O:  NPO - no I/O recorded in ER Abnormal lab work: No labs posted in EPIC at this time Diagnostics:  Chest x-ray shows cardiomegaly with pulmonary vascular congestion and moderate bil pleural effusions. EKG shows sinus no ST-T issue. IVs/PRNs: Lasix 20 mg IV adm at 1500 on 02/14/21, Maxipime 2 g in 161m NS IV adm at 1315 on 02/14/21,  Vancomycin 1500 mg/300 ml IV adm at 1315 on 02/14/21   Problem list in MD EPIC Notes:  Acute hypoxic respite failure: -Continue oxygen support.  Acute hypoxic and uremic encephalopathy -Extensive discussion with patient family including POA/sister, daughter and niece at bedside, all parties agreed that worsening of patient's breathing status came from fluid overload, pulmonary edema and increasing bilateral pleural effusion, and the only treatment will be diuresis and hemodialysis.  Family maintained patient not a candidate for hemodialysis, thus I decide to treat with Lasix and see response.  -Also discussed about the concern of sepsis from the family given the patient had fever last night.  Given the baseline problem of CKD and CHF, as well as worsening of hypoxia, any fluid resuscitation and IV antibiotics likely be only precipitate problem of fluid overload which already life threatening.  All parties including POA/sister, daughter and niece agreed with no further IV fluid or antibiotics, or sepsis work-up. -Family for the recent concern about use of morphine, I explained to them about [facility] morphine and his case is to ease respiratory distress and make patient less struggling with breathing failure, which is in line with goal of care/hospice.  Family however concerned about respiratory depression from morphine, and not liquid with use of morphine.  Discussed with hospice team, will not start morphine at this time. -Discussed with hospice team regarding conversation with family, hospice team will further manage comfort care measures. -1 dose of Lasix given.  Acute on chronic systolic CHF decompensation -As above AKI on CKD CKD stage IV -Not a  candidate for HD.    D/C planning- Will continue to assess during hospitalization.  Upon discharge, please use [facility] for all ACC patient's transportation needs.  Goals of Care: Pt is a DNR - ongoing discussions with family about EOL comfort care by [facility]  team. [facility] PMT consulted for [place] while in hospital. Communication with IDT- [place] [facility] team updated on patient.  [facility] notified of hospitalization. Also updated [facility] RN to update via telephone throughout day. Communication with [facility] - Supported family at bedside   Please call with any hospice related questions/concerns,   Gar Ponto, RN [place] (in [place]) [phone]

## 2021-02-14 NOTE — ED Notes (Signed)
Report given to Suella Grove, RN of [phone]

## 2021-02-14 NOTE — ED Provider Notes (Signed)
[facility] EMERGENCY DEPARTMENT Provider Note   CSN: [id number] Arrival date & time: 02/14/21  1143     History Chief Complaint  Patient presents with  . End of Life Care    [name] [name] is a 75 y.o. male.  HPI  Presents with minimal level of consciousness. Found this AM by family. H/o renal failure, on hospice. He did not improve with sternal rub, alcohol swabs to his skin (since it was warm), or wetting his mouth, so he was BIBEMS. Niece and daughter at bedside; express pt is on hospice care and would not want therapy to reverse his illness. They state [name] is his Copy; called faye, used 2 patient identifiers to confirm, and while she does request IV fluids and tylenol, she does request these in order to try to increase his comfort and does note his wishes are for comfort and not illness directed therapy.     Past Medical History:  Diagnosis Date  . 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 Closed comminuted intertrochanteric fracture of left femur ([place])   . Diabetes mellitus without complication ([place])   . Diabetic hyperosmolar non-ketotic state ([place]) 05/15/2016  . DKA (diabetic ketoacidoses) 05/09/2016  . Hypertension   . Hypoglycemia 11/29/2019  . Postoperative anemia due to acute blood loss 06/18/2016  . Retroperitoneal hematoma 06/18/2016  . Stroke ([place])   . Vitamin B 12 deficiency 06/18/2016    Patient Active Problem List   Diagnosis Date Noted  . Hypoxia 02/14/2021  . Acute on chronic respiratory failure with hypoxia ([place]) 12/20/2020  . CAP (community acquired pneumonia) 12/03/2020  . Acute respiratory failure with hypoxia ([place]) 12/03/2020  . Hypokalemia 12/03/2020  . Goals of care, counseling/discussion 12/03/2020  . Aspiration, chronic pulmonary 10/19/2020  . Volume overload 10/19/2020  . Fluid overload 10/18/2020  . Prolonged QT interval 10/18/2020  . Hospice care patient   . Acute  encephalopathy 08/18/2020  . Aspiration pneumonia of right lower lobe ([place]) 08/18/2020  . AKI (acute kidney injury) ([place]) 08/15/2020  . Type 1 diabetes mellitus with hyperglycemia ([place]) 07/27/2020  . 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 Type 2 diabetes mellitus with stage 5 chronic kidney disease ([place]) 07/27/2020  . Thrombocytopenia ([place]) 04/23/2020  . Hypothyroidism 04/22/2020  . DKA (diabetic ketoacidoses) 04/14/2020  . PVD (peripheral vascular disease) ([place]) 02/02/2020  . Poor social situation 12/08/2019  . CKD (chronic kidney disease) stage 4, GFR 15-29 ml/min ([name]) 12/08/2019  . Cognitive decline 11/22/2019  . ETOH abuse 05/17/2019  . Chronic diastolic CHF (congestive heart failure) ([place]) 05/17/2019  . Uncontrolled type 2 diabetes mellitus with hyperglycemia, with long-term current use of insulin ([place]) 10/03/2018  . Underweight 11/18/2017  . Normocytic anemia 11/18/2017  . History of cerebrovascular accident (CVA) in adulthood 07/25/2017  . Depression with anxiety 07/25/2017  . Left ventricular diastolic dysfunction 04/88/8916  . Dementia ([place]) 07/25/2017  . Brittle diabetes mellitus ([place]) 06/28/2017  . Hyperlipidemia associated with type 2 diabetes mellitus ([place]) 06/27/2017  . Hypertension associated with diabetes ([place]) 06/27/2017  . Weakness 11/16/2016  . CKD (chronic kidney disease), stage V ([place])   . Noncompliance with medication regimen 08/17/2016  . History of DVT (deep vein thrombosis) 07/06/2016  . Vitamin B 12 deficiency 06/18/2016  . Hyperglycemia 05/15/2016  . Hyponatremia 05/15/2016  . Anxiety 05/15/2016  . Narcotic dependency, continuous ([place]) 11/02/2015  . Uncontrolled type 2 diabetes mellitus  with diabetic nephropathy, with long-term current use of insulin ([place]) 08/12/2015  . Anemia of chronic disease 02/02/2013    Past Surgical History:  Procedure Laterality Date  . HIP ARTHROPLASTY Right 02/05/2013   Procedure:  ARTHROPLASTY BIPOLAR HIP;  Surgeon: [name], MD;  Location: [facility];  Service: Orthopedics;  Laterality: Right;  . INTRAMEDULLARY (IM) NAIL INTERTROCHANTERIC Left 06/16/2016   Procedure: INTRAMEDULLARY (IM) NAIL INTERTROCHANTRIC;  Surgeon: Rod Can, MD;  Location: [facility];  Service: Orthopedics;  Laterality: Left;       Family History  Problem Relation Age of Onset  . Alzheimer's disease Mother   . Hypertension Mother   . Hyperlipidemia Father   . Hypertension Father   . Healthy Maternal Grandmother   . Pneumonia Maternal Grandfather     Social History   Tobacco Use  . Smoking status: Never Smoker  . Smokeless tobacco: Never Used  Vaping Use  . Vaping Use: Never used  Substance Use Topics  . Alcohol use: No  . Drug use: No    Home Medications Prior to Admission medications   Medication Sig Start Date End Date Taking? Authorizing Provider  ACCU-CHEK GUIDE test strip USE AS DIRECTED 06/12/20   Azzie Glatter, [name]  acetaminophen (TYLENOL) 325 MG tablet Take 2 tablets (650 mg total) by mouth every 6 (six) hours as needed for up to 30 doses for mild pain or moderate pain. Patient not taking: No sig reported 07/06/20   Wyvonnia Dusky, MD  albuterol (PROVENTIL) (2.5 MG/3ML) 0.083% nebulizer solution Take 2.5 mg by nebulization every 6 (six) hours as needed for shortness of breath or wheezing. 11/23/20   [provider]  amLODipine (NORVASC) 10 MG tablet Take 1 tablet (10 mg total) by mouth daily. 06/26/20   Libby Maw, MD  BD PEN NEEDLE NANO U/F 32G X 4 MM MISC USE TO INJECT HUMALOG AND LANTUS. 01/22/21   [name], [name], MD  Blood Glucose Monitoring Suppl (ACCU-CHEK GUIDE) w/Device KIT 1 Device by Does not apply route QID. 11/02/19   Azzie Glatter, [name]  calcium acetate (PHOSLO) 667 MG capsule Take 2 capsules (1,334 mg total) by mouth 3 (three) times daily with meals. 10/22/20   [name], [name], DO  docusate sodium (COLACE) 100 MG capsule  Take 1 capsule (100 mg total) by mouth 2 (two) times daily as needed for mild constipation. 12/06/20   Alma Friendly, MD  feeding supplement (ENSURE ENLIVE / ENSURE PLUS) LIQD Take 237 mLs by mouth 2 (two) times daily between meals. Patient not taking: No sig reported 10/23/20   [name], [name], DO  furosemide (LASIX) 80 MG tablet Take 80 mg by mouth 2 (two) times daily with breakfast and lunch.    [provider]  glucose blood (ACCU-CHEK GUIDE) test strip Use as instructed 11/03/18   Azzie Glatter, [name]  glucose blood (TRUE METRIX BLOOD GLUCOSE TEST) test strip CHECK BLOOD SUGAR UP TO 4 TIMES A DAY. Patient taking differently: 1 each by Other route See admin instructions. CHECK BLOOD SUGAR UP TO 2 TIMES A DAY. 05/25/18   Azzie Glatter, [name]  hydrALAZINE (APRESOLINE) 100 MG tablet TAKE 1 TABLET BY MOUTH 3 TIMES A DAY. Patient taking differently: Take 100 mg by mouth 3 (three) times daily with meals. 07/30/20   Libby Maw, MD  hyoscyamine (LEVSIN SL) 0.125 MG SL tablet Place 1 tablet (0.125 mg total) under the tongue every 4 (four) hours as needed (excess oral secretions). 10/22/20  [name], [name], DO  Insulin Glargine (BASAGLAR KWIKPEN) 100 UNIT/ML Inject 5 Units into the skin at bedtime. 08/20/20   [name], DO  Insulin Glargine (BASAGLAR KWIKPEN) 100 UNIT/ML INJECT 5 UNITS UNDER THE SKIN AT BEDTIME. 10/31/20 10/31/21  Hertweck, Elenore Rota, MD  insulin lispro (HUMALOG KWIKPEN) 100 UNIT/ML KwikPen Inject 0-10 Units into the skin 3 (three) times daily as needed (blood sugar). Max daily 30 units Patient taking differently: Inject 1-10 Units into the skin 3 (three) times daily as needed (high blood sugar, CBG >120). Max daily 30 units 08/20/20   [name], DO  insulin lispro (HUMALOG) 100 UNIT/ML KwikPen INJECT UNDER THE SKIN PER SLIDING SCALE. MAX 30 UNITS DAILY. 10/31/20 10/31/21  Hertweck, Elenore Rota, MD  Insulin Pen Needle 31G X 5 MM MISC USE UP TO 4 NEEDLES PER DAY WITH  BASAGLAR & HUMALOG 10/31/20 10/31/21  Hertweck, Elenore Rota, MD  Insulin Syringe-Needle U-100 (BD INSULIN SYRINGE U/F) 31G X 5/16" 0.3 ML MISC Inject 1 each as directed 2 (two) times daily. as directed 11/02/19   Azzie Glatter, [name]  levothyroxine (SYNTHROID) 50 MCG tablet Take 1 tablet (50 mcg total) by mouth daily before breakfast. 11/29/20   Oswald Hillock, MD  LORazepam (ATIVAN) 0.5 MG tablet Take 1 tablet (0.5 mg total) by mouth every 4 (four) hours as needed for anxiety. May crush, mix with water and give sublingually if needed. 10/22/20   [name], [name], DO  oxyCODONE (OXY IR/ROXICODONE) 5 MG immediate release tablet Take 1 tablet (5 mg total) by mouth every 4 (four) hours as needed for severe pain. May crush, mix with water and give sublingually if needed. 10/22/20   [name], [name], DO  prochlorperazine (COMPAZINE) 10 MG tablet Take 1 tablet (10 mg total) by mouth every 4 (four) hours as needed for nausea or vomiting. May crush, mix with water and give sublingually. Patient not taking: No sig reported 10/22/20   [name], [name], DO  sodium bicarbonate 650 MG tablet Take 1 tablet (650 mg total) by mouth 2 (two) times daily. Patient taking differently: Take 650 mg by mouth 2 (two) times daily with a meal. 04/19/20   [name], MD  vitamin B-12 1000 MCG tablet Take 1 tablet (1,000 mcg total) by mouth daily. Patient taking differently: Take 1,000 mcg by mouth daily with lunch. 12/05/19   [name], MD    Allergies    Patient has no allergy information on record.  Review of Systems   Review of Systems  Unable to perform ROS: Mental status change    Physical Exam Updated Vital Signs BP (!) 92/50 (BP Location: Right Arm)   Pulse 73   Temp 98.4 F (36.9 C) (Temporal)   Resp (!) 24   SpO2 91%   Physical Exam Vitals and nursing note reviewed.  Constitutional:      Appearance: He is well-developed.     Comments: Obtunded No response to sternal rub  HENT:     Head: Normocephalic and atraumatic.      Mouth/Throat:     Mouth: Mucous membranes are dry.  Eyes:     Conjunctiva/sclera: Conjunctivae normal.     Pupils: Pupils are equal, round, and reactive to light.  Cardiovascular:     Rate and Rhythm: Normal rate and regular rhythm.     Heart sounds: No murmur heard.   Pulmonary:     Breath sounds: Rales present.     Comments: tachypnea Abdominal:     Palpations: Abdomen is soft.  Tenderness: There is no abdominal tenderness.  Musculoskeletal:     Cervical back: Neck supple.  Skin:    General: Skin is warm and dry.  Neurological:     Comments: Obtunded Not responsive to painful stimuli     ED Results / Procedures / Treatments   Labs (all labs ordered are listed, but only abnormal results are displayed) Labs Reviewed  RESP PANEL BY RT-PCR (FLU A&B, COVID) [id number]    EKG EKG Interpretation  Date/Time:  Thursday Feb 14 2021 11:46:23 EDT Ventricular Rate:  82 PR Interval:  225 QRS Duration: 89 QT Interval:  428 QTC Calculation: 500 R Axis:   -58 Text Interpretation: Sinus rhythm Prolonged PR interval Probable inferior infarct, age indeterminate Anteroseptal infarct, old Lateral leads are also involved Confirmed by Madalyn Rob [phone]) on 02/14/2021 12:44:12 PM   Radiology DG Chest Port 1 View  Result Date: 02/14/2021 CLINICAL DATA:  Sepsis EXAM: PORTABLE CHEST 1 VIEW COMPARISON:  12/20/2020 FINDINGS: Stable cardiomegaly. Atherosclerotic calcification of the aortic knob. Persistent moderate bilateral pleural effusions, right greater than left. Mild pulmonary vascular congestion. Hazy bibasilar opacities. No pneumothorax. IMPRESSION: Cardiomegaly with pulmonary vascular congestion and moderate bilateral pleural effusions, right greater than left. Overall, findings are similar to the previous study. Electronically Signed   By: Davina Poke D.O.   On: 02/14/2021 14:23    Procedures Procedures   Medications Ordered in ED Medications  acetaminophen (TYLENOL)  tablet 650 mg (has no administration in time range)    Or  acetaminophen (TYLENOL) suppository 650 mg (has no administration in time range)  ondansetron (ZOFRAN) tablet 4 mg (has no administration in time range)    Or  ondansetron (ZOFRAN) injection 4 mg (has no administration in time range)  furosemide (LASIX) injection 20 mg (has no administration in time range)    ED Course  I have reviewed the triage vital signs and the nursing notes.  Pertinent labs & imaging results that were available during my care of the patient were reviewed by me and considered in my medical decision making (see chart for details).    MDM Rules/Calculators/A&P                           Pt presents with poor neurologic status, rhonchorous and tachypneic. Bagged by EMS in route due to being DNR/DNI. POC [place] by EMS in [place]. Satting well on NRB; wishes confirmed so will not pursue disease-oriented medical therapy. Consulted palliative to assist with GOC and comfort recs. D/w family members that pt will need admission to be kept comfortable and that I do not think this is achievable at home. Family agrees. Paged hospitalist. Hospitalist agreed to [place]. Pt in similar ocndition on re-eval  Final Clinical Impression(s) / ED Diagnoses Final diagnoses:  None    Rx / DC Orders ED Discharge Orders    None       Aris Lot, MD 02/14/21 1616    Lucrezia Starch, MD 02/15/21 2046

## 2021-02-14 NOTE — Progress Notes (Signed)
02/14/2021 Patient transfer from the Emergency room to 2West at 1600. He no alert to person, place time or situation. Patient have stage 2 on sacrum, moisture associate skin damage on the buttock and groin area, barrier cream and foam pad apply to area. Patient also have generalize edema. [facility] RN.

## 2021-02-14 NOTE — Progress Notes (Signed)
Palliative Medicine RN Note:  Patient is actively admitted to [facility], [facility] ([facility]). I have spoken with their liaison Chrislyn, who is aware.   Marjie Skiff Ariel Dimitri, RN, BSN, [facility] Palliative Medicine Team 02/14/2021 12:10 PM Office [phone]

## 2021-02-15 DIAGNOSIS — R4182 Altered mental status, unspecified: Secondary | ICD-10-CM

## 2021-02-15 DIAGNOSIS — J9600 Acute respiratory failure, unspecified whether with hypoxia or hypercapnia: Secondary | ICD-10-CM

## 2021-02-15 DIAGNOSIS — R0902 Hypoxemia: Secondary | ICD-10-CM

## 2021-02-15 MED ORDER — SODIUM CHLORIDE 0.9 % IV SOLN
1.0000 g | INTRAVENOUS | Status: DC
  Administered 2021-02-15 – 2021-02-16 (×2): 1 g via INTRAVENOUS
  Filled 2021-02-15: qty 1
  Filled 2021-02-15: qty 10

## 2021-02-15 NOTE — Progress Notes (Signed)
Nutrition Brief Note  Pt appeared on [facility] report. Chart reviewed. Pt now transitioning to comfort care.  No further nutrition interventions planned at this time.  Please re-consult as needed.   Larkin Ina, MS, RD, LDN RD pager number and weekend/on-call pager number located in [place].

## 2021-02-15 NOTE — Progress Notes (Addendum)
[facility] 2W31 - [facility]) - Hospitalized patient note:  [name]. 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 Patient was transported to [facility] ER per family request for evaluation after experiencing fever and congestion in the home. Hospice RN was notified via telephone and attempted to assist family in managing symptoms in the home while offering a home visit - Family elected to initiate EMS at that time. Patient is being admitted with Hypoxia and per Dr. Tomasa Hosteller of [facility] this a related admission. Patient is a DNR.  Visited [name] [name] today at bedside along with daughter and niece. [name] [name] was not alert or responsive. Shallow respirations, Bilateral upper and lower edema present and is currently on 10L HFNC, via nonrebreather mask. Family had several questions about treatment options and plan of care at this point. Family does not want Morphine or Ativan given. [name] [name] is NOT an option, they would like to take him back home for a natural death. Family stated that someone mentioned the patient had a UTI and would now like that treated prior to discharge. EOL signs were given and shown to the family and they are aware, but are still hopeful.   If discharged DME 10L concentrator for the home will need to be ordered.   VS: 94/56, HR 77, RR 25, O2 HFNC/NRB 10L 90% Labs: No recent labs Diagnostics:  IMPRESSION: Cardiomegaly with pulmonary vascular congestion and moderate bilateral pleural effusions, right greater than left. Overall, findings are similar to the previous study.   Electronically Signed   By: [name] [name] D.O.   On: 02/14/2021 14:23 IV: Problem list:  Acute hypoxic respite failure: -Continue oxygen support. Acute hypoxic and uremic encephalopathy -Extensive discussion with patient family including POA/sister, daughter and niece at bedside, all parties agreed that worsening of patient's  breathing status came from fluid overload, pulmonary edema and increasing bilateral pleural effusion, and the only treatment will be diuresis and hemodialysis. Family maintained patient not a candidate for hemodialysis, thus I decide to treat with Lasix and see response.  -Alsodiscussed about the concern of sepsis from the family given the patient had fever last night. Given the baseline problem of CKD and CHF,as well as worsening of hypoxia, any fluid resuscitation and IV antibiotics likely be only precipitate problem of fluid overload which already life threatening.All parties including POA/sister, daughter and niece agreed with no further IV fluid or antibiotics,or sepsis work-up. -Family for the recent concern about use of morphine, I explained to them about [facility] morphine and his case isto easerespiratory distressand make patient less struggling with breathing failure,which is in [employer] forces training and education officer of care/hospice. Family however concerned about respiratory depression from morphine,and not liquid with use of morphine. Discussed with hospice team, will not start morphine at this time. -Discussed with hospice team regarding conversation with family,hospice team will further manage comfort care measures. -1 dose of Lasix given. Acute on chronic systolic CHF decompensation -As above AKI on CKD CKD stage IV -Not a candidate for HD.   D/C planning- Will continue to assess during hospitalization. Family would like patient to be discharged back home for a natural death.   Upon discharge, please use [facility] for all [facility] patient's transportation needs.  Goals of Care: Pt is a DNR - ongoing discussions with family about EOL comfort care by [facility] team. [facility] PMT consulted for [facility] while in hospital. Communication with IDT- [facility] team updated on patient.  [facility] notified  of hospitalization. Also updated [facility] RN to update via telephone throughout day. Communication with [facility] - Supported family at  bedside  Please call with any hospice related questions/concerns.  Clementeen Hoof, BSN, RN [facility] (in [place]) [phone]

## 2021-02-15 NOTE — Progress Notes (Signed)
PROGRESS NOTE    [name] [name]  [id number] DOB: 21-May-1946 DOA: 02/14/2021 PCP: Libby Maw, MD    Brief Narrative:  75 y.o. 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    Patient developed drowsiness and less responsive. Found to have markedly increased O2 requirements up to 15L with evidence of gross volume overload. Pt noted to be minimally responsive to painful stimuli  Assessment & Plan:   Active Problems:   Hypoxia  Acute hypoxemic respiratory failure, progressive -Needing up to 10L this AM -CXR reviewed with findings of cardiomegaly with pulm vascular congestion and moderate B pleural effusions  Acute hypoxic and uremic encephalopathy -Admitting physician had discussion with patient family including POA/sister, daughter and niece at bedside, all parties agreed that worsening of patient's breathing status came from fluid overload, pulmonary edema and increasing bilateral pleural effusion, and the only treatment will be diuresis and hemodialysis.  Family maintained patient not a candidate for hemodialysis -Pt was given one dose of lasix overnight. Remains on 10L O2  -Family noted to have concerns regarding the use of morphine for symptom relief. Hospice noted and documentation reviewed -At this time, patient appears comfortable on supplemental O2, not grimacing in pain, and does not appear to be in distress -Would give additional lasix as needed if pt shows signs of increased resp effort  Possible UTI -No UA or cultures obtained with no significant amounts of urine to test this AM -family reports cloudy and foul smelling urine -Will give empiric course of rocephin  Acute on chronic systolic CHF decompensation -grossly volume overloaded with vascular congestion on CXR and LE edema -Given concerns of acute infection, avoiding over-diuresis -cont per above  AKI on CKD CKD  stage IV -Not a candidate for HD. -No labs given focus on comfort  End of life -Appreciate assistance by Hospice -Family agrees focus is primarily on comfort with exception of providing morphine or ativan for symptom relief -Per Hospice, plan is to treat empirically for UTI with goal of ultimately returning pt home to continue hospice care  DVT prophylaxis: comfort Code Status: DNR Family Communication: Pt in room, family at bedside  Status is: Inpatient  Remains inpatient appropriate because:Inpatient level of care appropriate due to severity of illness   Dispo: The patient is from: Home              Anticipated d/c is to: Home              Patient currently is not medically stable to d/c.   Difficult to place patient No       Consultants:   Palliative Care/Hospice  Procedures:     Antimicrobials: Anti-infectives (From admission, onward)   Start     Dose/Rate Route Frequency Ordered Stop   02/15/21 1215  cefTRIAXone (ROCEPHIN) 1 g in sodium chloride 0.9 % 100 mL IVPB        1 g 200 mL/hr over 30 Minutes Intravenous Every 24 hours 02/15/21 1129     02/14/21 1315  ceFEPIme (MAXIPIME) 2 g in sodium chloride 0.9 % 100 mL IVPB  Status:  Discontinued        2 g 200 mL/hr over 30 Minutes Intravenous  Once 02/14/21 1314 02/14/21 1343   02/14/21 1315  vancomycin (VANCOREADY) IVPB 1500 mg/300 mL  Status:  Discontinued        1,500 mg 150 mL/hr over 120 Minutes Intravenous  Once 02/14/21  1314 02/14/21 1343       Subjective: Unable to provide given mentation  Objective: Vitals:   02/15/21 0553 02/15/21 0632 02/15/21 0830 02/15/21 1403  BP: (!) 94/56   95/64  Pulse: 77  74 77  Resp: (!) 25   16  Temp: (!) 97 F (36.1 C)   (!) 96.1 F (35.6 C)  TempSrc: Axillary     SpO2: 90% (!) 86% 100% 95%    Intake/Output Summary (Last 24 hours) at 02/15/2021 1640 Last data filed at 02/15/2021 1256 Gross per 24 hour  Intake 100 ml  Output --  Net 100 ml   There were no  vitals filed for this visit.  Examination: General exam: Asleep, laying in bed, in nad Respiratory system: Normal respiratory effort, no wheezing Cardiovascular system: regular rate, s1, s2 Gastrointestinal system: Soft, nondistended, positive BS Central nervous system: CN2-12 grossly intact, strength intact Extremities: Perfused, no clubbing, BLE edema Skin: Normal skin turgor, no notable skin lesions seen Psychiatry: unable to assess given mentation   Data Reviewed: I have personally reviewed following labs and imaging studies  CBC: No results for input(s): WBC, NEUTROABS, HGB, HCT, MCV, PLT in the last 168 hours. Basic Metabolic Panel: No results for input(s): NA, K, CL, CO2, GLUCOSE, BUN, CREATININE, CALCIUM, MG, PHOS in the last 168 hours. GFR: CrCl cannot be calculated (Patient's most recent lab result is older than the maximum 21 days allowed.). Liver Function Tests: No results for input(s): AST, ALT, ALKPHOS, BILITOT, PROT, ALBUMIN in the last 168 hours. No results for input(s): LIPASE, AMYLASE in the last 168 hours. No results for input(s): AMMONIA in the last 168 hours. Coagulation Profile: No results for input(s): INR, PROTIME in the last 168 hours. Cardiac Enzymes: No results for input(s): CKTOTAL, CKMB, CKMBINDEX, TROPONINI in the last 168 hours. BNP (last 3 results) No results for input(s): PROBNP in the last 8760 hours. HbA1C: No results for input(s): HGBA1C in the last 72 hours. CBG: No results for input(s): GLUCAP in the last 168 hours. Lipid Profile: No results for input(s): CHOL, HDL, LDLCALC, TRIG, CHOLHDL, LDLDIRECT in the last 72 hours. Thyroid Function Tests: No results for input(s): TSH, T4TOTAL, FREET4, T3FREE, THYROIDAB in the last 72 hours. Anemia Panel: No results for input(s): VITAMINB12, FOLATE, FERRITIN, TIBC, IRON, RETICCTPCT in the last 72 hours. Sepsis Labs: No results for input(s): PROCALCITON, LATICACIDVEN in the last 168 hours.  Recent  Results (from the past 240 hour(s))  Resp Panel by RT-PCR (Flu A&B, Covid) Nasopharyngeal Swab     Status: None   Collection Time: 02/14/21 12:04 PM   Specimen: Nasopharyngeal Swab; Nasopharyngeal(NP) swabs in vial transport medium  Result Value Ref Range Status   SARS Coronavirus 2 by RT PCR NEGATIVE NEGATIVE Final    Comment: (NOTE) SARS-CoV-2 target nucleic acids are NOT DETECTED.  The SARS-CoV-2 RNA is generally detectable in upper respiratory specimens during the acute phase of infection. The lowest concentration of SARS-CoV-2 viral copies this assay can detect is 138 copies/mL. A negative result does not preclude SARS-Cov-2 infection and should not be used as the sole basis for treatment or other patient management decisions. A negative result may occur with  improper specimen collection/handling, submission of specimen other than nasopharyngeal swab, presence of viral mutation(s) within the areas targeted by this assay, and inadequate number of viral copies(<138 copies/mL). A negative result must be combined with clinical observations, patient history, and epidemiological information. The expected result is Negative.  Fact Sheet for Patients:  EntrepreneurPulse.com.au  Fact Sheet for Healthcare Providers:  IncredibleEmployment.be  This test is no t yet approved or cleared by the [place] FDA and  has been authorized for detection and/or diagnosis of SARS-CoV-2 by FDA under an Emergency Use Authorization (EUA). This EUA will remain  in effect (meaning this test can be used) for the duration of the COVID-19 declaration under Section 564(b)(1) of the Act, 21 U.S.C.section 360bbb-3(b)(1), unless the authorization is terminated  or revoked sooner.       Influenza A by PCR NEGATIVE NEGATIVE Final   Influenza B by PCR NEGATIVE NEGATIVE Final    Comment: (NOTE) The Xpert Xpress SARS-CoV-2/FLU/RSV plus assay is intended as an aid in the  diagnosis of influenza from Nasopharyngeal swab specimens and should not be used as a sole basis for treatment. Nasal washings and aspirates are unacceptable for Xpert Xpress SARS-CoV-2/FLU/RSV testing.  Fact Sheet for Patients: EntrepreneurPulse.com.au  Fact Sheet for Healthcare Providers: IncredibleEmployment.be  This test is not yet approved or cleared by the [place] FDA and has been authorized for detection and/or diagnosis of SARS-CoV-2 by FDA under an Emergency Use Authorization (EUA). This EUA will remain in effect (meaning this test can be used) for the duration of the COVID-19 declaration under Section 564(b)(1) of the Act, 21 U.S.C. section 360bbb-3(b)(1), unless the authorization is terminated or revoked.  Performed at [facility], [place] [address], [place], [place] [address]      Radiology Studies: DG Chest Port 1 View  Result Date: 02/14/2021 CLINICAL DATA:  Sepsis EXAM: PORTABLE CHEST 1 VIEW COMPARISON:  12/20/2020 FINDINGS: Stable cardiomegaly. Atherosclerotic calcification of the aortic knob. Persistent moderate bilateral pleural effusions, right greater than left. Mild pulmonary vascular congestion. Hazy bibasilar opacities. No pneumothorax. IMPRESSION: Cardiomegaly with pulmonary vascular congestion and moderate bilateral pleural effusions, right greater than left. Overall, findings are similar to the previous study. Electronically Signed   By: Davina Poke D.O.   On: 02/14/2021 14:23    Scheduled Meds: Continuous Infusions: . cefTRIAXone (ROCEPHIN)  IV 1 g (02/15/21 1256)     LOS: 1 day   Marylu Lund, MD [facility] Pager On Amion  If 7PM-7AM, please contact night-coverage 02/15/2021, 4:40 PM

## 2021-02-15 NOTE — Plan of Care (Signed)

## 2021-02-15 NOTE — Plan of Care (Signed)

## 2021-02-15 NOTE — Consult Note (Signed)
[facility] Nurse wound consult note Consultation was completed by review of records, images and assistance from the bedside nurse/clinical staff.   Reason for Consult: pressure injury Wound type: Stage 2 Pressure injury Pressure Injury POA: Yes Measurement:see nursing flow sheet Wound bed: clean and pink Drainage (amount, consistency, odor) none Periwound:intact  Dressing procedure/placement/frequency:continue silicone foam per skin care order set. Change every 3 days and PRN.  Assess under each shift for acute changes.  Turn and reposition per policy  Discussed POC with bedside nurse.  Re consult if needed, will not follow at this time. Thanks  Sumiye Hirth [employer], RN,CWOCN, CNS, [place] [phone])

## 2021-02-15 NOTE — Progress Notes (Signed)
Daily Progress Note   Patient Name: [name] [name]       Date: 02/15/2021 DOB: 1946-05-14  Age: 75 y.o. MRN#: [id number] Attending Physician: Donne Hazel, MD Primary Care Physician: Libby Maw, MD Admit Date: 02/14/2021  Reason for Consultation/Follow-up: goals of care  Subjective: Note that patient became hypoxic overnight, now requiring 10L oxygen via HFNC. Also note that famiy requests treatment for possible UTI; plan is to treat empirically with rocephin.   Patient appears comfortable. Unresponsive to voice and light touch. No non-verbal signs of pain or discomfort noted. Respirations are even, shallow, and unlabored. No excessive respiratory secretions noted.  Family/niece present at bedside. Education and counseling provided on expectations at EOL. Emotional support provided.      Length of Stay: 1  Current Medications: Scheduled Meds:    Continuous Infusions: . cefTRIAXone (ROCEPHIN)  IV 1 g (02/15/21 1256)    PRN Meds: acetaminophen **OR** acetaminophen, ondansetron **OR** ondansetron (ZOFRAN) IV         Vital Signs: BP 95/64 (BP Location: Right Arm)   Pulse 77   Temp (!) 96.1 F (35.6 C)   Resp 16   SpO2 95%  SpO2: SpO2: 95 % O2 Device: O2 Device: High Flow Nasal Cannula,NRB O2 Flow Rate: O2 Flow Rate (L/min): 10 L/min  Intake/output summary:   Intake/Output Summary (Last 24 hours) at 02/15/2021 1730 Last data filed at 02/15/2021 1256 Gross per 24 hour  Intake 100 ml  Output --  Net 100 ml   LBM: Last BM Date: 02/14/21 Baseline Weight:   Most recent weight:         Palliative Assessment/Data: PPS 10%       Palliative Care Assessment & Plan   HPI/Patient Profile: 75 y.o. male  with past medical history of chronic systolic CHF, CKD stage  IV, HTN, insulin-dependent DM, and CVA. He presented to the emergency department on 02/14/2021 with altered mental status. Per family, patient developed drowsiness and became less responsive overnight so they called EMS. Family also reporting a low-grade fever of 100 last night. In the ED, patient was found to be hypoxic and placed on 15L NRB. Chest x-ray showed pulmonary edema and worsening bilateral pleural effusion. He was admitted to [facility] with acute hypoxic respiratory failure and acute encephalopathy.   Assessment: - acute hypoxic respiratory  failure - acute encephalopathy - possible UTI - acute on chronic systolic CHF - AKI on CKD stage IV  Recommendations/Plan:  DNR/DNI as previously documented  Continue current medical care - oxygen, cardiac monitoring,   Family wishes patient to receive empiric treatment with IV antibiotics  Family is adamantly opposed to use of morphine or Ativan  If patient develops pain and/or dyspnea, would recommend use of low-dose IV Fentanyl  Goal of care is for patient to return home if he is stable for transport  PMT will continue to follow  Goals of Care and Additional Recommendations:  Limitations on Scope of Treatment: no labs  Code Status: DNR/DNI  Prognosis:   hours to days, possibly weeks  Discharge Planning:  Goal is home with hospice  Care plan was discussed with hospice liaison  Thank you for allowing the Palliative Medicine Team to assist in the care of this patient.   Total Time 15 minutes Prolonged Time Billed  no      Greater than 50%  of this time was spent counseling and coordinating care related to the above assessment and plan.  Lavena Bullion, NP  Please contact Palliative Medicine Team phone at [phone] for questions and concerns.

## 2021-02-15 NOTE — Progress Notes (Signed)
RT called to see patient due to low O2 levels. RN had already just placed 10 LPM HFNC and NRB on patient. Patient's sats had increased to 86%. Breath sounds were diminished with some fine crackles. Patient has pitting edema on both arms. Patient is followed by hospice at home. Resting comfortably at this time and does not appear to be in distress just hypoxic. Family at bedside.

## 2021-02-16 DIAGNOSIS — Z515 Encounter for palliative care: Principal | ICD-10-CM

## 2021-02-16 DIAGNOSIS — Z66 Do not resuscitate: Secondary | ICD-10-CM

## 2021-02-16 DIAGNOSIS — Z789 Other specified health status: Secondary | ICD-10-CM

## 2021-02-16 DIAGNOSIS — R4182 Altered mental status, unspecified: Secondary | ICD-10-CM

## 2021-02-16 LAB — GLUCOSE, CAPILLARY: Glucose-Capillary: 255 mg/dL — ABNORMAL HIGH (ref 70–99)

## 2021-02-16 NOTE — Progress Notes (Signed)
Manufacturing engineer [facility])      This patient is currently on service with [facility] for hospice with a terminal dx of ESRD.  Based on secure chat this liaison was advised that pt would dc home today and needs 15L O2 in the home.  Pt currently has 5L concentrator.  10 L concentrator ordered through [employer] for STAT delivery.  Spoke with sister [name] to confirm plan.  [name] asked if pt would continue to receive IV antibiotics; discussed with her that pt would not these at home.  Per [name] her priority is that pt pass at home and not at the hospital.  Referenced discussion with Luetta Nutting, NP with PMT, who recommends discharging home.  Per [name] she agrees with this but did not realize that means the IV antibiotics would stop.  Faye verbalizes understanding of that at this time, endorses desire for home death.  TOC and care team updated. Thank you for the opportunity to participate in this patient's care.     Domenic Moras, BSN, RN [facility] Liaison [phone] (24h on call)

## 2021-02-16 NOTE — Discharge Summary (Signed)
Physician Discharge Summary  Deangleo Passage MWN:[id number] DOB: 11/27/1945 DOA: 02/14/2021  PCP: Libby Maw, MD  Admit date: 02/14/2021 Discharge date: 02/16/2021  Admitted From: Home Disposition:  Home  Recommendations for Outpatient Follow-up:  1. Follow up with PCP as needed 2. Follow up with home hospice services 3. Recommend PRN lasix as needed for increased sob. Please note scheduled lasix was held secondary to low BP which family understands  Discharge Condition:Stable CODE STATUS:DNR Diet recommendation: Comfort   Brief/Interim Summary: 75 y.o.malewith medical history significant ofchronic systolic CHF, CKD stage IV on home hospice, HTN, IDDM, stroke, mentation changes.   Patient developed drowsiness and less responsive. Found to have markedly increased O2 requirements up to 15L with evidence of gross volume overload. Pt noted to be minimally responsive to painful stimuli  Discharge Diagnoses:  Active Problems:   Hypoxia  Acute hypoxemic respiratory failure, progressive -Needing up to 10L this visit -CXR reviewed with findings of cardiomegaly with pulm vascular congestion and moderate B pleural effusions -Pt is grossly volume overloaded. Ideally, would give trial of scheduled lasix, however patient's sbp has remained in the 80's to low-90's consistently overnight. I expressed concerns that giving high dose lasix now will likely drop patient's bp further, possibly resulting in further decompensation, thus recommendation is to hold off on lasix at this time, especially as pt appears comfortable on current O2 requirements, as noted by family present -Moving forward, would recommend lasix as needed if pt appears to become more sob for symptom relief  Acute hypoxic and uremic encephalopathy -Admitting physician had discussion with patient family including POA/sister, daughter and niece at bedside, all parties agreed that worsening of patient's breathing status came from  fluid overload, pulmonary edema and increasing bilateral pleural effusion, and the only treatment will be diuresis and hemodialysis. Family maintained patient not a candidate for hemodialysis -Pt was given one dose of lasix at time of presentation. Remains on 10L O2  -Family noted to have concerns regarding the use of morphine for symptom relief. Hospice noted and documentation reviewed -At this time, patient appears comfortable on supplemental O2, not grimacing in pain, and does not appear to be in distress -See above, given concerns of persistent hypotension, have held off on scheduled lasix. Family is aware that high dose lasix would likely cause pt's BP to drop further, possibly resulting in further decompensation. Family agrees to hold off on lasix unless patient has signs of sob  Possible UTI -No UA or cultures obtained with no significant amounts of urine to test this AM -family reports cloudy and foul smelling urine -treated with two doses of rocephin  Acute on chronic systolic CHF decompensation -grossly volume overloaded with vascular congestion on CXR and LE edema -see above, given low BP, held off on lasix, especially as pt appears comfortable  AKI on CKD CKD stage IV -Not a candidate for HD. -No labs given focus on comfort  End of life -Appreciate assistance by Hospice -Family agrees focus is primarily on comfort with exception of providing morphine or ativan for symptom relief -Plan d/c home with hospice  Discharge Instructions   Allergies as of 02/16/2021   Not on File     Medication List    STOP taking these medications   Accu-Chek Guide test strip Generic drug: glucose blood   Accu-Chek Guide w/Device Kit   amLODipine 10 MG tablet Commonly known as: NORVASC   Basaglar KwikPen 100 UNIT/ML   BD Pen Needle Nano U/F 32G X 4 MM Misc  Generic drug: Insulin Pen Needle   calcium acetate 667 MG capsule Commonly known as: PHOSLO   cyanocobalamin 1000 MCG  tablet   docusate sodium 100 MG capsule Commonly known as: COLACE   feeding supplement Liqd   furosemide 80 MG tablet Commonly known as: LASIX   glucose blood test strip Commonly known as: Accu-Chek Guide   HumaLOG KwikPen 100 UNIT/ML KwikPen Generic drug: insulin lispro   hydrALAZINE 100 MG tablet Commonly known as: APRESOLINE   insulin lispro 100 UNIT/ML KwikPen Commonly known as: HumaLOG KwikPen   Insulin Syringe-Needle U-100 31G X 5/16" 0.3 ML Misc Commonly known as: BD Insulin Syringe U/F   levothyroxine 50 MCG tablet Commonly known as: SYNTHROID   LORazepam 0.5 MG tablet Commonly known as: ATIVAN   PenTips 31G X 5 MM Misc Generic drug: Insulin Pen Needle   prochlorperazine 10 MG tablet Commonly known as: COMPAZINE   sodium bicarbonate 650 MG tablet     TAKE these medications   acetaminophen 325 MG tablet Commonly known as: Tylenol Take 2 tablets (650 mg total) by mouth every 6 (six) hours as needed for up to 30 doses for mild pain or moderate pain.   albuterol (2.5 MG/3ML) 0.083% nebulizer solution Commonly known as: PROVENTIL Take 2.5 mg by nebulization every 6 (six) hours as needed for shortness of breath or wheezing.   hyoscyamine 0.125 MG SL tablet Commonly known as: LEVSIN SL Place 1 tablet (0.125 mg total) under the tongue every 4 (four) hours as needed (excess oral secretions).   oxyCODONE 5 MG immediate release tablet Commonly known as: Oxy IR/ROXICODONE Take 1 tablet (5 mg total) by mouth every 4 (four) hours as needed for severe pain. May crush, mix with water and give sublingually if needed.       Follow-up Information    Libby Maw, MD Follow up.   Specialty: Family Medicine Why: as needed Contact information: [place] [address] [phone]              Not on File  Consultations:  [place]  Procedures/Studies: [facility] Chest Port 1 View  Result Date:  02/14/2021 CLINICAL DATA:  Sepsis EXAM: PORTABLE CHEST 1 VIEW COMPARISON:  12/20/2020 FINDINGS: Stable cardiomegaly. Atherosclerotic calcification of the aortic knob. Persistent moderate bilateral pleural effusions, right greater than left. Mild pulmonary vascular congestion. Hazy bibasilar opacities. No pneumothorax. IMPRESSION: Cardiomegaly with pulmonary vascular congestion and moderate bilateral pleural effusions, right greater than left. Overall, findings are similar to the previous study. Electronically Signed   By: Davina Poke D.O.   On: 02/14/2021 14:23     Subjective: Unable to assess given mentation  Discharge Exam: Vitals:   02/16/21 0600 02/16/21 0917  BP: (!) 91/51 (!) 136/120  Pulse: 73 (!) 45  Resp: 20   Temp: 97.8 F (36.6 C) (!) 97.5 F (36.4 C)  SpO2: 100% (!) 73%   Vitals:   02/15/21 1403 02/15/21 2134 02/16/21 0600 02/16/21 0917  BP: 95/64 (!) 90/51 (!) 91/51 (!) 136/120  Pulse: 77 79 73 (!) 45  Resp: _0 Temp: (!) 96.1 F (35.6 C) 97.7 F (36.5 C) 97.8 F (36.6 C) (!) 97.5 F (36.4 C)  TempSrc:  Bladder Oral Oral  SpO2: 95% 96% 100% (!) 73%    General: Pt is alert, awake, not in acute distress Cardiovascular: RRR, S1/S2 +, no rubs, no gallops Respiratory: CTA bilaterally, no wheezing, no rhonchi Abdominal: Soft, NT, ND, bowel sounds +  Extremities: no edema, no cyanosis   The results of significant diagnostics from this hospitalization (including imaging, microbiology, ancillary and laboratory) are listed below for reference.     Microbiology: Recent Results (from the past 240 hour(s))  Resp Panel by RT-PCR (Flu A&B, Covid) Nasopharyngeal Swab     Status: None   Collection Time: 02/14/21 12:04 PM   Specimen: Nasopharyngeal Swab; Nasopharyngeal(NP) swabs in vial transport medium  Result Value Ref Range Status   SARS Coronavirus 2 by RT PCR NEGATIVE NEGATIVE Final    Comment: (NOTE) SARS-CoV-2 target nucleic acids are NOT DETECTED.  The  SARS-CoV-2 RNA is generally detectable in upper respiratory specimens during the acute phase of infection. The lowest concentration of SARS-CoV-2 viral copies this assay can detect is 138 copies/mL. A negative result does not preclude SARS-Cov-2 infection and should not be used as the sole basis for treatment or other patient management decisions. A negative result may occur with  improper specimen collection/handling, submission of specimen other than nasopharyngeal swab, presence of viral mutation(s) within the areas targeted by this assay, and inadequate number of viral copies(<138 copies/mL). A negative result must be combined with clinical observations, patient history, and epidemiological information. The expected result is Negative.  Fact Sheet for Patients:  EntrepreneurPulse.com.au  Fact Sheet for Healthcare Providers:  IncredibleEmployment.be  This test is no t yet approved or cleared by the [place] FDA and  has been authorized for detection and/or diagnosis of SARS-CoV-2 by FDA under an Emergency Use Authorization (EUA). This EUA will remain  in effect (meaning this test can be used) for the duration of the COVID-19 declaration under Section 564(b)(1) of the Act, 21 U.S.C.section 360bbb-3(b)(1), unless the authorization is terminated  or revoked sooner.       Influenza A by PCR NEGATIVE NEGATIVE Final   Influenza B by PCR NEGATIVE NEGATIVE Final    Comment: (NOTE) The Xpert Xpress SARS-CoV-2/FLU/RSV plus assay is intended as an aid in the diagnosis of influenza from Nasopharyngeal swab specimens and should not be used as a sole basis for treatment. Nasal washings and aspirates are unacceptable for Xpert Xpress SARS-CoV-2/FLU/RSV testing.  Fact Sheet for Patients: EntrepreneurPulse.com.au  Fact Sheet for Healthcare Providers: IncredibleEmployment.be  This test is not yet approved or  cleared by the [place] FDA and has been authorized for detection and/or diagnosis of SARS-CoV-2 by FDA under an Emergency Use Authorization (EUA). This EUA will remain in effect (meaning this test can be used) for the duration of the COVID-19 declaration under Section 564(b)(1) of the Act, 21 U.S.C. section 360bbb-3(b)(1), unless the authorization is terminated or revoked.  Performed at [facility], [place] [address] [place], [place] [address]      Labs: BNP (last 3 results) Recent Labs    11/25/20 1807 12/02/20 1229 12/20/20 0721  BNP 1,100.2* 891.5* 5,974.1*   Basic Metabolic Panel: No results for input(s): NA, K, CL, CO2, GLUCOSE, BUN, CREATININE, CALCIUM, MG, PHOS in the last 168 hours. Liver Function Tests: No results for input(s): AST, ALT, ALKPHOS, BILITOT, PROT, ALBUMIN in the last 168 hours. No results for input(s): LIPASE, AMYLASE in the last 168 hours. No results for input(s): AMMONIA in the last 168 hours. CBC: No results for input(s): WBC, NEUTROABS, HGB, HCT, MCV, PLT in the last 168 hours. Cardiac Enzymes: No results for input(s): CKTOTAL, CKMB, CKMBINDEX, TROPONINI in the last 168 hours. BNP: Invalid input(s): POCBNP CBG: Recent Labs  Lab 02/16/21 1038  GLUCAP 255*   D-Dimer  No results for input(s): DDIMER in the last 72 hours. Hgb A1c No results for input(s): HGBA1C in the last 72 hours. Lipid Profile No results for input(s): CHOL, HDL, LDLCALC, TRIG, CHOLHDL, LDLDIRECT in the last 72 hours. Thyroid function studies No results for input(s): TSH, T4TOTAL, T3FREE, THYROIDAB in the last 72 hours.  Invalid input(s): FREET3 Anemia work up No results for input(s): VITAMINB12, FOLATE, FERRITIN, TIBC, IRON, RETICCTPCT in the last 72 hours. Urinalysis    Component Value Date/Time   COLORURINE YELLOW 12/02/2020 1630   APPEARANCEUR CLEAR 12/02/2020 1630   LABSPEC 1.013 12/02/2020 1630   PHURINE 6.0 12/02/2020 1630   GLUCOSEU >=500 (A)  12/02/2020 1630   GLUCOSEU >=1000 (A) 12/08/2019 1139   HGBUR SMALL (A) 12/02/2020 1630   BILIRUBINUR NEGATIVE 12/02/2020 1630   BILIRUBINUR Negative 11/09/2019 0910   KETONESUR 5 (A) 12/02/2020 1630   PROTEINUR >=300 (A) 12/02/2020 1630   UROBILINOGEN 0.2 12/08/2019 1139   NITRITE NEGATIVE 12/02/2020 1630   LEUKOCYTESUR NEGATIVE 12/02/2020 1630   Sepsis Labs Invalid input(s): PROCALCITONIN,  WBC,  LACTICIDVEN Microbiology Recent Results (from the past 240 hour(s))  Resp Panel by RT-PCR (Flu A&B, Covid) Nasopharyngeal Swab     Status: None   Collection Time: 02/14/21 12:04 PM   Specimen: Nasopharyngeal Swab; Nasopharyngeal(NP) swabs in vial transport medium  Result Value Ref Range Status   SARS Coronavirus 2 by RT PCR NEGATIVE NEGATIVE Final    Comment: (NOTE) SARS-CoV-2 target nucleic acids are NOT DETECTED.  The SARS-CoV-2 RNA is generally detectable in upper respiratory specimens during the acute phase of infection. The lowest concentration of SARS-CoV-2 viral copies this assay can detect is 138 copies/mL. A negative result does not preclude SARS-Cov-2 infection and should not be used as the sole basis for treatment or other patient management decisions. A negative result may occur with  improper specimen collection/handling, submission of specimen other than nasopharyngeal swab, presence of viral mutation(s) within the areas targeted by this assay, and inadequate number of viral copies(<138 copies/mL). A negative result must be combined with clinical observations, patient history, and epidemiological information. The expected result is Negative.  Fact Sheet for Patients:  EntrepreneurPulse.com.au  Fact Sheet for Healthcare Providers:  IncredibleEmployment.be  This test is no t yet approved or cleared by the [place] FDA and  has been authorized for detection and/or diagnosis of SARS-CoV-2 by FDA under an Emergency Use  Authorization (EUA). This EUA will remain  in effect (meaning this test can be used) for the duration of the COVID-19 declaration under Section 564(b)(1) of the Act, 21 U.S.C.section 360bbb-3(b)(1), unless the authorization is terminated  or revoked sooner.       Influenza A by PCR NEGATIVE NEGATIVE Final   Influenza B by PCR NEGATIVE NEGATIVE Final    Comment: (NOTE) The Xpert Xpress SARS-CoV-2/FLU/RSV plus assay is intended as an aid in the diagnosis of influenza from Nasopharyngeal swab specimens and should not be used as a sole basis for treatment. Nasal washings and aspirates are unacceptable for Xpert Xpress SARS-CoV-2/FLU/RSV testing.  Fact Sheet for Patients: EntrepreneurPulse.com.au  Fact Sheet for Healthcare Providers: IncredibleEmployment.be  This test is not yet approved or cleared by the [place] FDA and has been authorized for detection and/or diagnosis of SARS-CoV-2 by FDA under an Emergency Use Authorization (EUA). This EUA will remain in effect (meaning this test can be used) for the duration of the COVID-19 declaration under Section 564(b)(1) of the Act, 21 U.S.C. section 360bbb-3(b)(1), unless the authorization is  terminated or revoked.  Performed at [facility], [place] [address], [place], [place] [address]    Time spent: 30 min  SIGNED:   Marylu Lund, MD  [facility] 02/16/2021, 4:27 PM  If 7PM-7AM, please contact night-coverage

## 2021-02-16 NOTE — Progress Notes (Addendum)
Daily Progress Note   Patient Name: [name] [name]       Date: 02/16/2021 DOB: 1945-11-07  Age: 75 y.o. MRN#: [id number] Attending Physician: Donne Hazel, MD Primary Care Physician: Libby Maw, MD Admit Date: 02/14/2021  Reason for Consultation/Follow-up: Disposition, Establishing goals of care, Non pain symptom management, Pain control, Psychosocial/spiritual support and Terminal Care  Subjective: Chart review performed.  Received report from primary LPN -no acute concerns.  LPN states patient is not eating or drinking.  Went to visit patient at bedside -niece/Crystal, nephew/Jason, son/Serafino were present.  Patient was lying in bed - he does not respond to voice/gentle touch. No signs or non-verbal gestures of pain or discomfort noted. No respiratory distress, increased work of breathing, or secretions noted.  Patient is wearing nonrebreather.  Emotional support provided to family.  Answered questions family had around discharge and antibiotic use.  Education provided that decreased oral intake is a poor prognostic indicator -explained to family that at this time, it is likely despite interventions patient's time is becoming very limited -they expressed understanding.  All questions and concerns addressed. Encouraged to call with questions and/or concerns. PMT card provided.  Called patient's sister/Faye -offered and provided emotional support and therapeutic listening. Education provided that decreased oral intake is a poor prognostic indicator -explained to family that at this time, it is likely despite interventions patient's time is becoming very limited.  [name] expresses understanding and explains that she also agrees with this prognosis - she feels patient will likely pass within  the week -validated that patient may pass within the week and certainly most likely within 2 weeks. She states that once patient is discharged home from this hospitalization she "thinks this is it."  Discussed Faye's thoughts and feelings regarding patient declining and passing away at home.  [name] is clear that the goal is to have patient pass away at home surrounded by family, they do not wish for him to pass away in the hospital.  Discussed with [name] that patient will likely become too unstable for transfer in the near future and if the goal is to get him home recommend discharge home sooner than later.  [name] expressed understanding and agrees that she does want patient home sooner than later.  She understands IV antibiotics would be stopped if discharged home. She  would really like patient to be home by tomorrow/Mother's Day as patient's mother is still living and wants to see him.  [name] tells me she would be ready for patient to return home with hospice as early as today if oxygen concentrator can be set up.   Encouraged [name] to call hospice for any questions/concerns as patient declines instead of returning to the hospital, so hospice can support their goal of patient passing away at home - she expressed understanding and agreed returning to the hospital is not their goal.  All questions and concerns addressed. Encouraged to call with questions and/or concerns. PMT number previously provided.   Length of Stay: 2  Current Medications: Scheduled Meds:    Continuous Infusions: . cefTRIAXone (ROCEPHIN)  IV 1 g (02/16/21 1114)    PRN Meds: acetaminophen **OR** acetaminophen, ondansetron **OR** ondansetron (ZOFRAN) IV  Physical Exam Vitals and nursing note reviewed.  Constitutional:      General: He is not in acute distress.    Appearance: He is ill-appearing.  Pulmonary:     Effort: No respiratory distress.  Skin:    General: Skin is warm and dry.  Neurological:     Mental Status: He is  unresponsive.     Motor: Weakness present.  Psychiatric:        Speech: He is noncommunicative.             Vital Signs: BP (!) 136/120   Pulse (!) 45   Temp (!) 97.5 F (36.4 C) (Oral)   Resp 20   SpO2 (!) 73%  SpO2: SpO2: (!) 73 % O2 Device: O2 Device: NRB O2 Flow Rate: O2 Flow Rate (L/min): 10 L/min  Intake/output summary:   Intake/Output Summary (Last 24 hours) at 02/16/2021 1120 Last data filed at 02/16/2021 0500 Gross per 24 hour  Intake 100 ml  Output 450 ml  Net -350 ml   LBM: Last BM Date: 02/15/21 Baseline Weight:   Most recent weight:         Palliative Assessment/Data: PPS 10%      Patient Active Problem List   Diagnosis Date Noted  . Hypoxia 02/14/2021  . Acute on chronic respiratory failure with hypoxia ([place]) 12/20/2020  . CAP (community acquired pneumonia) 12/03/2020  . Acute respiratory failure with hypoxia ([place]) 12/03/2020  . Hypokalemia 12/03/2020  . Goals of care, counseling/discussion 12/03/2020  . Aspiration, chronic pulmonary 10/19/2020  . Volume overload 10/19/2020  . Fluid overload 10/18/2020  . Prolonged QT interval 10/18/2020  . Hospice care patient   . Acute encephalopathy 08/18/2020  . Aspiration pneumonia of right lower lobe ([place]) 08/18/2020  . AKI (acute kidney injury) ([place]) 08/15/2020  . Type 1 diabetes mellitus with hyperglycemia ([place]) 07/27/2020  . 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 Type 2 diabetes mellitus with stage 5 chronic kidney disease ([place]) 07/27/2020  . Thrombocytopenia ([place]) 04/23/2020  . Hypothyroidism 04/22/2020  . DKA (diabetic ketoacidoses) 04/14/2020  . PVD (peripheral vascular disease) ([place]) 02/02/2020  . Poor social situation 12/08/2019  . CKD (chronic kidney disease) stage 4, GFR 15-29 ml/min ([name]) 12/08/2019  . Cognitive decline 11/22/2019  . ETOH abuse 05/17/2019  . Chronic diastolic CHF (congestive heart failure) ([place]) 05/17/2019  . Uncontrolled type 2  diabetes mellitus with hyperglycemia, with long-term current use of insulin ([place]) 10/03/2018  . Underweight 11/18/2017  . Normocytic anemia 11/18/2017  . History of cerebrovascular accident (CVA) in adulthood 07/25/2017  . Depression with anxiety 07/25/2017  .  Left ventricular diastolic dysfunction [id number]  . Dementia ([place]) 07/25/2017  . Brittle diabetes mellitus ([place]) 06/28/2017  . Hyperlipidemia associated with type 2 diabetes mellitus ([place]) 06/27/2017  . Hypertension associated with diabetes ([place]) 06/27/2017  . Weakness 11/16/2016  . CKD (chronic kidney disease), stage V ([place])   . Noncompliance with medication regimen 08/17/2016  . History of DVT (deep vein thrombosis) 07/06/2016  . Vitamin B 12 deficiency 06/18/2016  . Hyperglycemia 05/15/2016  . Hyponatremia 05/15/2016  . Anxiety 05/15/2016  . Narcotic dependency, continuous ([place]) 11/02/2015  . Uncontrolled type 2 diabetes mellitus with diabetic nephropathy, with long-term current use of insulin ([place]) 08/12/2015  . Anemia of chronic disease 02/02/2013    Palliative Care Assessment & Plan   Patient Profile: 75 y.o.malewith past medical history of chronic systolic CHF, CKD stage IV, HTN, insulin-dependent DM, and CVA. He presented to the emergency departmenton 5/5/2022with altered mental status.Per family, patient developed drowsiness and became less responsive overnight so they called EMS. Family also reporting a low-grade fever of 100 last night.In the ED, patientwas found to behypoxic and placed on 15L NRB. Chest x-ray showed pulmonary edema and worsening bilateral pleural effusion. He was admitted to [facility] with acute hypoxic respiratory failure and acute encephalopathy.  Assessment: Acute hypoxic respiratory failure Acute encephalopathy Possible UTI Acute on chronic systolic CHF AKI on CKD stage IV Terminal care  Recommendations/Plan:  Continue comfort measures with oxygen  Continue DNR/DNI as previously  documented  Patient's sister's goal is for patient to return home with continued hospice support as early as today once oxygen concentrator is set up - requesting nonemergent transport for patient.  TOC and [place] liaison notified  Family understands that patient's time is becoming very limited despite medical interventions due to poor p.o. intake.  Their goal is for him to pass away at home and not in the hospital  Encouraged family to call hospice for any questions/concerns as patient declines instead of returning to the hospital, so hospice can support their goal of patient passing away at home  Family are opposed to the use of morphine or Ativan -would recommend low-dose fentanyl if needed for symptom management  PMT will continue to follow and support holistically  Goals of Care and Additional Recommendations:  Limitations on Scope of Treatment: Full Comfort Care  Code Status:    Code Status Orders  (From admission, onward)         Start     Ordered   02/14/21 1401  Do not attempt resuscitation (DNR)  Continuous       Question Answer Comment  In the event of cardiac or respiratory ARREST Do not call a "code blue"   In the event of cardiac or respiratory ARREST Do not perform Intubation, CPR, defibrillation or ACLS   In the event of cardiac or respiratory ARREST Use medication by any route, position, wound care, and other measures to relive pain and suffering. May use oxygen, suction and manual treatment of airway obstruction as needed for comfort.   Comments MOST form in [place]      02/14/21 1401        Code Status History    Date Active Date Inactive Code Status Order ID Comments User Context   02/14/2021 1154 02/14/2021 1401 DNR [id number]  Lucrezia Starch, MD ED   12/20/2020 1234 12/20/2020 2015 DNR [id number]  Norval Morton, MD ED   12/02/2020 1925 12/07/2020 0445 DNR [id number]  Jonnie Finner, DO ED   11/25/2020 2246  11/28/2020 2232 DNR [id number]  Rise Patience, MD ED    10/20/2020 1123 10/23/2020 0018 DNR [id number]  Asencion Gowda, NP Inpatient   10/19/2020 0014 10/20/2020 1123 Full Code [id number]  Toy Baker, MD Inpatient   08/19/2020 0003 08/20/2020 2132 DNR [id number]  Vianne Bulls, MD ED   08/14/2020 2205 08/18/2020 2054 DNR [id number]  Shela Leff, MD ED   04/22/2020 2306 04/28/2020 1723 DNR [id number]  Lenore Cordia, MD ED   04/14/2020 2159 04/20/2020 1933 Full Code [id number]  Donnamae Jude, MD Inpatient   03/24/2020 1602 03/29/2020 1925 Full Code [id number]  Gifford Shave, MD ED   03/24/2020 1602 03/24/2020 1602 Full Code [id number]  Gifford Shave, MD ED   11/28/2019 1418 12/04/2019 2152 Full Code [id number]  Norval Morton, MD ED   11/22/2019 1847 11/25/2019 2040 Full Code [id number]  Guilford Shi, MD ED   05/17/2019 0141 05/17/2019 2341 Full Code [id number]  Toy Baker, MD Inpatient   01/27/2019 1641 02/01/2019 1846 Full Code [id number]  [name], DO ED   01/27/2019 1557 01/27/2019 1641 Full Code [id number]  [name], DO ED   10/03/2018 0353 10/04/2018 2027 Full Code [id number]  Norval Morton, MD ED   11/17/2017 0006 11/19/2017 2317 Full Code [id number]  Etta Quill, DO ED   08/01/2017 1758 08/13/2017 2237 Full Code [id number]  Magdalen Spatz, NP ED   07/25/2017 1359 07/28/2017 1641 Full Code [id number]  Samella Parr, NP ED   06/27/2017 1619 06/30/2017 2006 Full Code [id number]  Samella Parr, NP Inpatient   05/03/2017 1232 05/08/2017 1855 Full Code [id number]  Benito Mccreedy, MD Inpatient   05/03/2017 1045 05/03/2017 1232 DNR [id number]  Benito Mccreedy, MD ED   03/22/2017 2351 03/31/2017 1546 DNR [id number]  Karmen Bongo, MD Inpatient   01/30/2017 0302 02/01/2017 1911 Full Code [id number]  Gardiner Barefoot, NP Inpatient   01/29/2017 2222 01/30/2017 0302 Full Code [id number]  Karmen Bongo, MD Inpatient   01/16/2017 2209 01/20/2017 1843 Full Code [id number]  Toy Baker, MD Inpatient   12/28/2016 1712 12/30/2016 1924  Full Code [id number]  Hosie Poisson, MD ED   12/14/2016 0038 12/17/2016 2110 Full Code [id number]  Vianne Bulls, MD ED   11/16/2016 1117 11/20/2016 2230 Full Code [id number]  Radene Gunning, NP ED   10/01/2016 1828 10/07/2016 1953 Full Code [id number]  Omar Person, NP ED   08/17/2016 1438 08/19/2016 2216 Full Code [id number]  Maren Reamer, MD ED   07/06/2016 0130 07/07/2016 2243 Full Code [id number]  Lily Kocher, MD Inpatient   06/15/2016 1007 06/21/2016 1921 Full Code [id number]  Tawni Millers, MD Inpatient   05/15/2016 2135 05/17/2016 1653 Full Code [id number]  Vianne Bulls, MD ED   05/09/2016 0252 05/12/2016 1716 Full Code [id number]  Roney Jaffe, MD Inpatient   11/01/2015 1837 11/03/2015 1708 Full Code [id number]  Robbie Lis, MD Inpatient   09/26/2015 0012 09/27/2015 1542 Full Code [id number]  Etta Quill, DO ED   08/21/2015 1725 08/24/2015 2120 Full Code [id number]  Domenic Polite, MD Inpatient   08/12/2015 1637 08/14/2015 1445 Full Code [id number]  Robbie Lis, MD ED   08/08/2015 0037 08/10/2015 1830 Full Code [id number]  Allyne Gee, MD ED   07/05/2015 0921 07/09/2015 1635 Full Code [id number]  Chesley Mires, MD ED   07/02/2015 1836 07/04/2015 2236 Full Code [id number]  Marshell Garfinkel, MD ED   03/20/2015 1106 03/23/2015 1550 Full Code [id number]  Allie Bossier, MD  ED   03/20/2015 0032 03/20/2015 1106 Full Code [id number]  Lavina Hamman, MD ED   03/19/2014 2157 03/22/2014 1232 Full Code [id number]  Rise Patience, MD Inpatient   01/21/2014 1506 01/25/2014 1212 Full Code [id number]  Eugenie Filler, MD ED   07/03/2013 2050 07/05/2013 1609 Full Code [id number]  Theodis Blaze, MD ED   05/29/2013 2249 05/31/2013 1602 Full Code [id number]  Merton Border, MD Inpatient   02/05/2013 1919 02/09/2013 1740 Full Code [id number]  [name], MD Inpatient   02/02/2013 2057 02/05/2013 1919 Full Code [id number]  Isaac Bliss, Rayford Halsted, MD ED   Advance Care Planning Activity       Prognosis:   < 2  weeks , likely less if family decide to wean oxygen  Discharge Planning:  Home with Hospice  Care plan was discussed with primary LPN, patient's family, Dr. Wyline Copas, [facility], [facility] liaison  Thank you for allowing the Palliative Medicine Team to assist in the care of this patient.   Total Time  40 minutes Prolonged Time Billed  no       Greater than 50%  of this time was spent counseling and coordinating care related to the above assessment and plan.  Lin Landsman, NP  Please contact Palliative Medicine Team phone at [phone] for questions and concerns.   *Portions of this note are a verbal dictation therefore any spelling and/or grammatical errors are due to the "[employer] [employer]" system interpretation.

## 2021-02-16 NOTE — Plan of Care (Signed)

## 2021-02-16 NOTE — TOC Transition Note (Signed)
Transition of Care [facility]) - CM/SW Discharge Note   Patient Details  Name: [name] [name] MRN: [id number] Date of Birth: 02-17-1946  Transition of Care [facility]) CM/SW Contact:  Coralee Pesa, [place] Phone Number: 02/16/2021, 4:28 PM   Clinical Narrative:     Pt to return home via [facility] w/ hospice services. DC packet with DNR are on the chart. SW will sign off at this time.        Patient Goals and CMS Choice        Discharge Placement                       Discharge Plan and Services                                     Social Determinants of Health (SDOH) Interventions     Readmission Risk Interventions Readmission Risk Prevention Plan 04/20/2020  Transportation Screening Complete  Medication Review [employer]) Complete  SW Recovery Care/Counseling Consult Complete  Palliative Care Screening Complete  Skilled Nursing Facility Complete  Some recent data might be hidden

## 2021-03-01 ENCOUNTER — Telehealth: Payer: Self-pay | Admitting: Family Medicine

## 2021-03-01 NOTE — Telephone Encounter (Signed)
Notification in [facility] that pt death certification needing to be completed. Pt passed away 02-24-21.

## 2021-03-13 DEATH — deceased

## 2021-05-31 IMAGING — CT CT ANGIO HEAD
3 of 7 series · 9 of 34 positions shown · IV contrast (APPLIED)
Comparison: Noncontrast head CT from earlier today

CLINICAL DATA: Left-sided weakness

EXAM:
CT ANGIOGRAPHY HEAD AND NECK
TECHNIQUE: Multidetector CT imaging of the head and neck was performed using
the standard protocol during bolus administration of intravenous
contrast. Multiplanar CT image reconstructions and MIPs were
obtained to evaluate the vascular anatomy. Carotid stenosis
measurements (when applicable) are obtained utilizing NASCET
criteria, using the distal internal carotid diameter as the
denominator.
CONTRAST:  Dose is not yet known

[Series 5: cta neck/head · axial · 0.53mm/px · z∈[-205,-93]mm · 2 of 170 slices shown]
[im 57/170  soft-tissue]
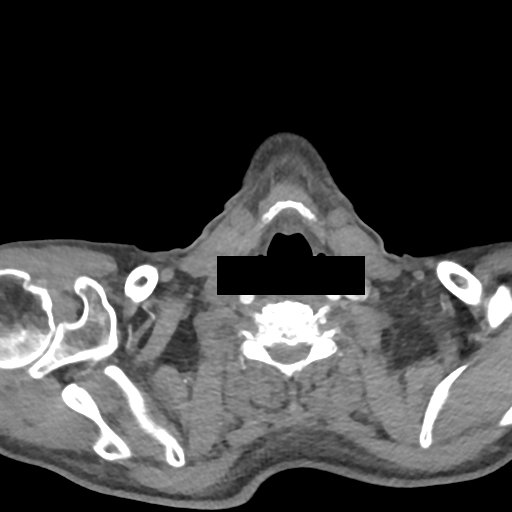
[im 113/170  soft-tissue]
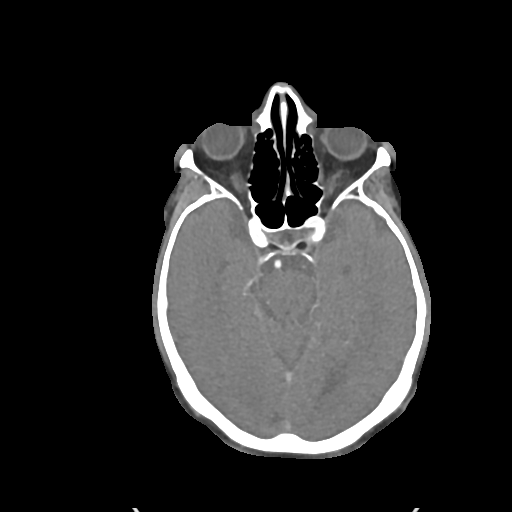

[Series 7: ax thins · axial · 0.39mm/px · z∈[-269,-29]mm · 6 of 336 slices shown]
[im 48/336  soft-tissue]
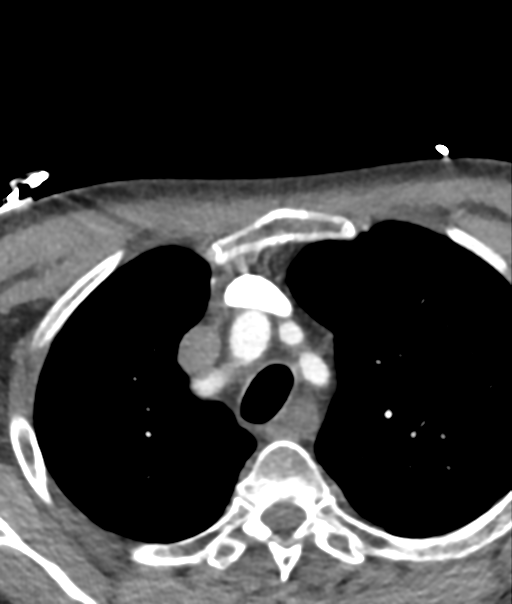
[im 96/336  bone]
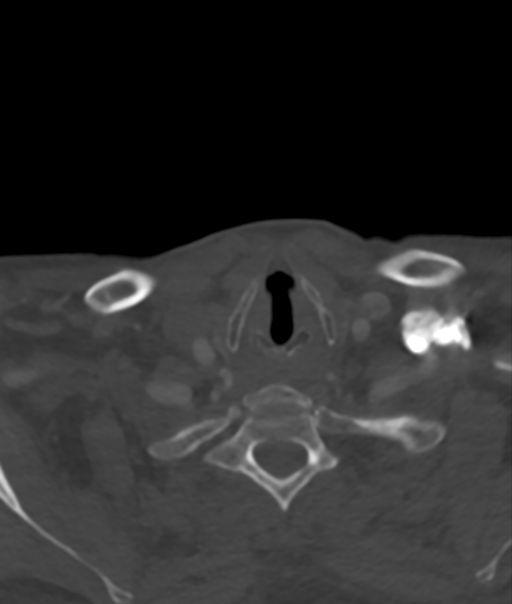
[im 144/336  soft-tissue]
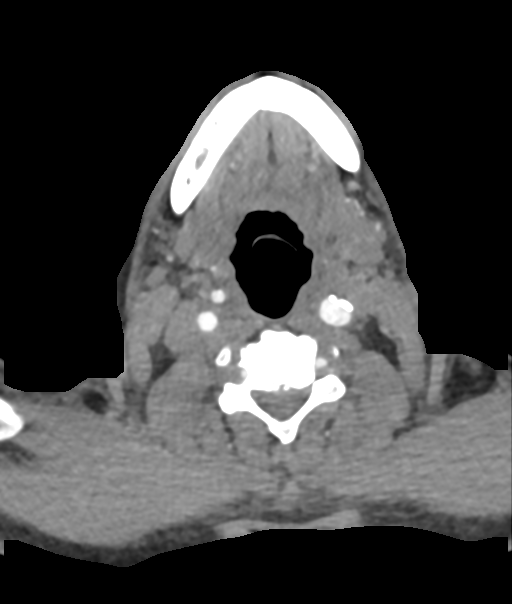
[im 192/336  bone]
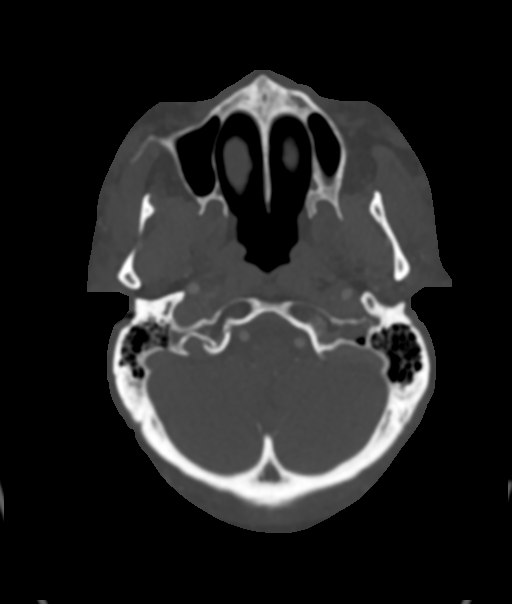
[im 240/336  soft-tissue]
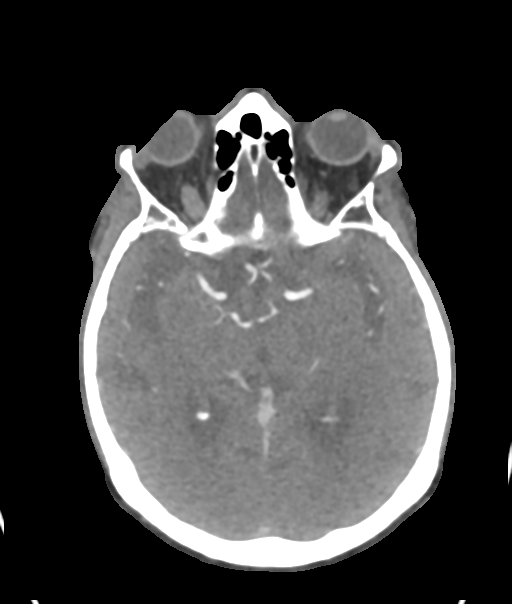
[im 288/336  bone]
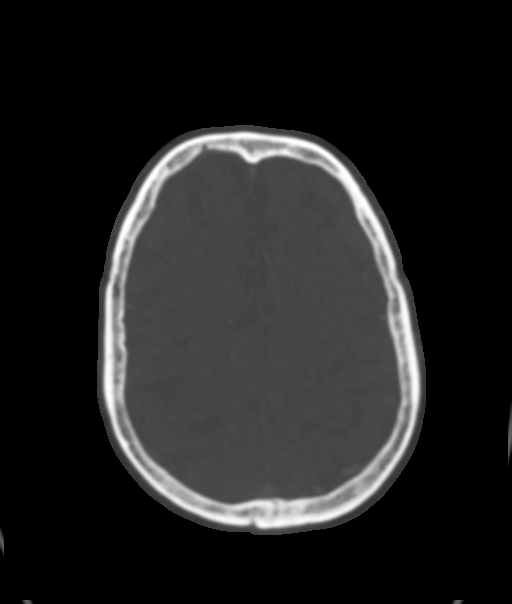

[Series 9: sag thins · sagittal · 0.46mm/px · 1 of 200 slices shown]
[im 180/200  soft-tissue]
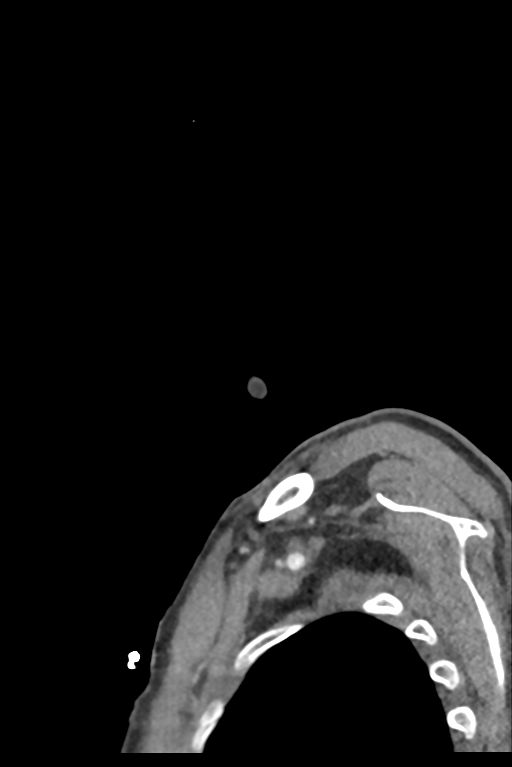

[9 of 34 positions shown; findings below may reference images not displayed]

FINDINGS: CTA NECK FINDINGS

Aortic arch: Atherosclerotic plaque.  Three vessel branching.

Right carotid system: Tortuous vessels. Overall mild
atherosclerosis. No flow limiting stenosis or ulceration.

Left carotid system: Mild mainly calcified plaque at the
bifurcation. ICA tortuosity at the skull base. No flow limiting
stenosis or ulceration.

Vertebral arteries: No proximal subclavian stenosis. Limited
assessment of vertebral origins due to image blurring. Bilateral
vertebral tortuosity without flow limiting stenosis or beading.

Skeleton: Advanced degenerative disc narrowing and ridging with C2-3
anterolisthesis.

Other neck: No acute finding.

Upper chest: Negative

Review of the MIP images confirms the above findings

CTA HEAD FINDINGS

Anterior circulation: Atheromatous calcification along the carotid
siphons. Mild scattered atheromatous irregularity of bilateral MCA
branches. No branch occlusion, beading, or aneurysm.

Posterior circulation: Bilateral V4 segment atheromatous plaque. The
vertebrobasilar arteries are diffusely patent. Moderate right distal
P2 segment narrowing. Left PCA branch narrowings.

Venous sinuses: Unremarkable in the arterial phase

Anatomic variants: None significant

Review of the MIP images confirms the above findings
IMPRESSION: 1. Negative for large vessel occlusion or other emergent finding.
2. Cervical atherosclerosis without flow limiting stenosis of major
neck vessels.
3. Intracranial atherosclerosis, most notably a moderate right P2
segment stenosis.

## 2021-05-31 IMAGING — DX DG CHEST 1V
1 series · 1 of 1 positions shown · non-contrast
Comparison: 11/28/2019

CLINICAL DATA: Go stroke, line placement

EXAM:
CHEST  1 VIEW

[chest]
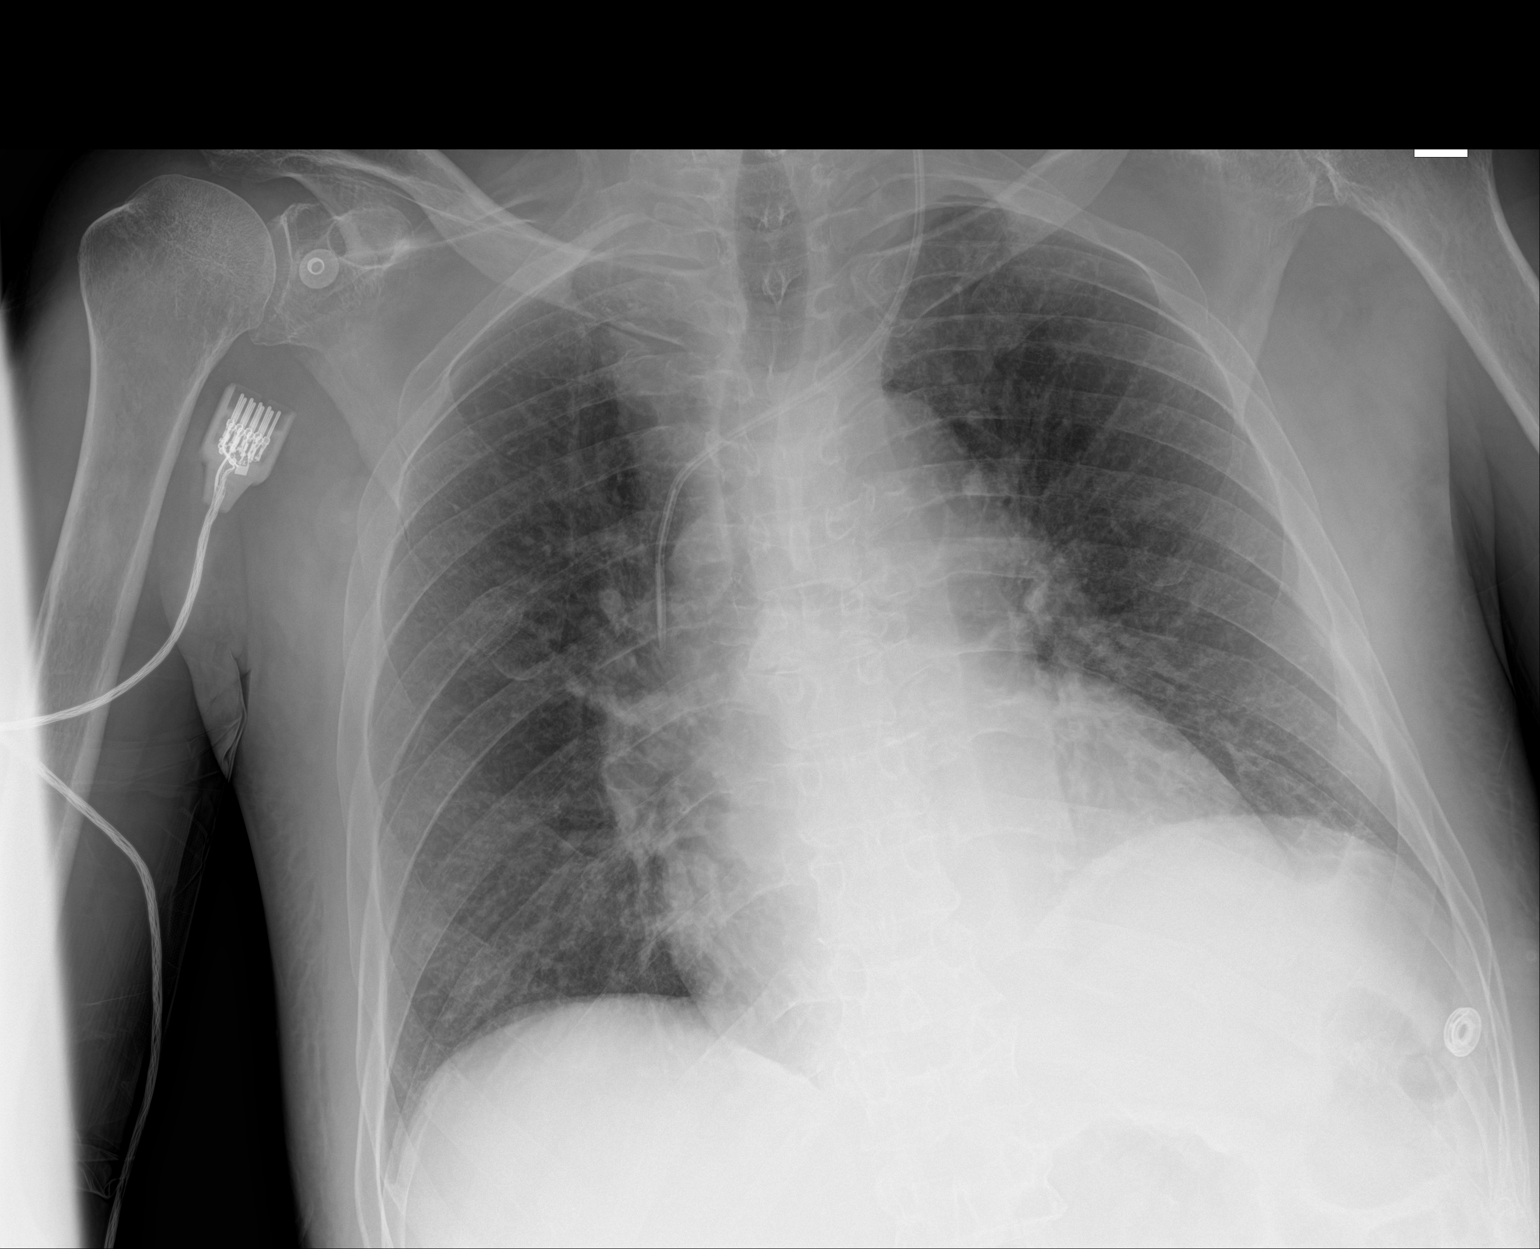

[1 of 1 positions shown; findings below may reference images not displayed]

FINDINGS: Left central line tip is in the SVC. No pneumothorax. Mild
cardiomegaly and vascular congestion. No confluent opacities,
effusions or edema. No acute bony abnormality.
IMPRESSION: Cardiomegaly, vascular congestion.

Left central line tip in the SVC.  No pneumothorax.

## 2021-05-31 IMAGING — CT CT HEAD CODE STROKE
4 series · 17 of 47 positions shown, 19 images · non-contrast
Comparison: 11/22/2019

CLINICAL DATA: Code stroke. Right-sided gaze and left-sided
weakness

EXAM:
CT HEAD WITHOUT CONTRAST
TECHNIQUE: Contiguous axial images were obtained from the base of the skull
through the vertex without intravenous contrast.

[Series 3: head wo · axial · 0.40mm/px · z∈[-128,-18]mm · 6 of 32 slices shown, 8 images]
[im 5/32  brain]
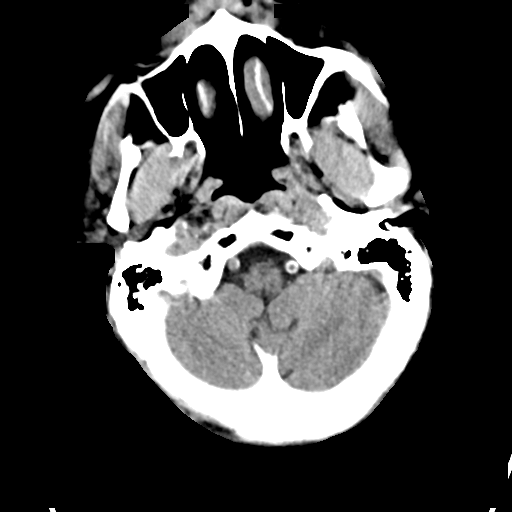
[im 5/32  bone]
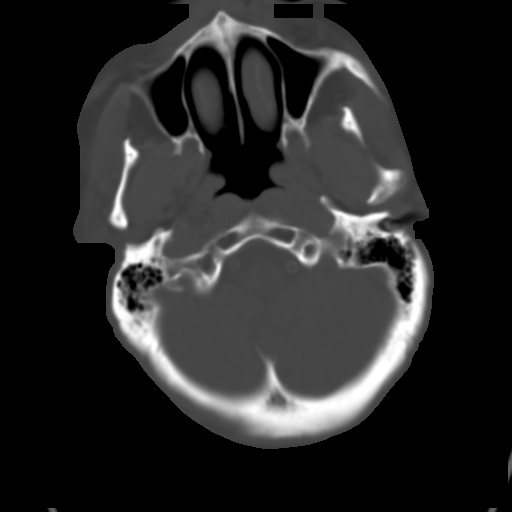
[im 9/32  brain]
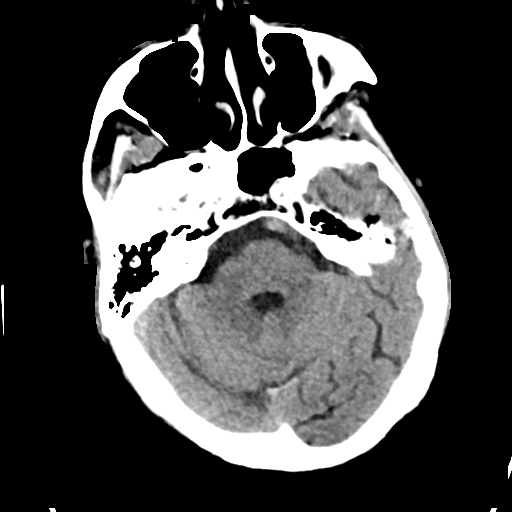
[im 14/32  brain]
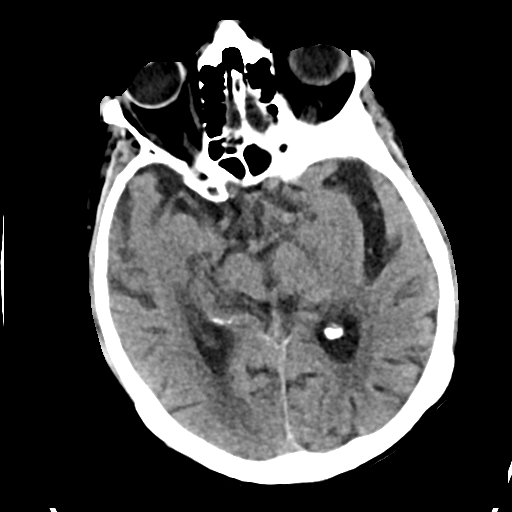
[im 18/32  brain]
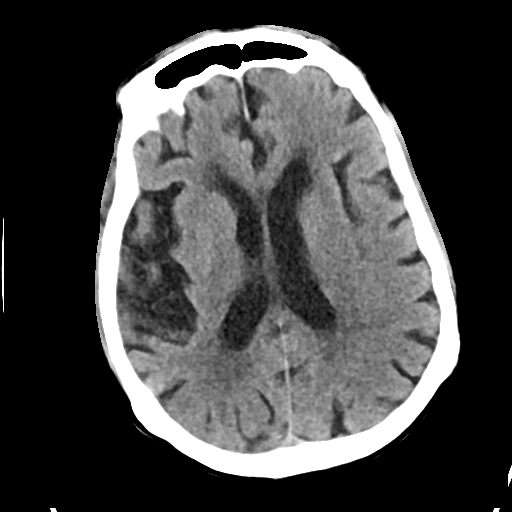
[im 23/32  brain]
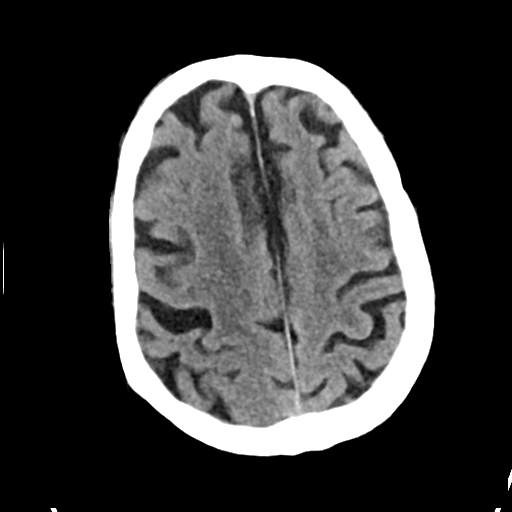
[im 23/32  bone]
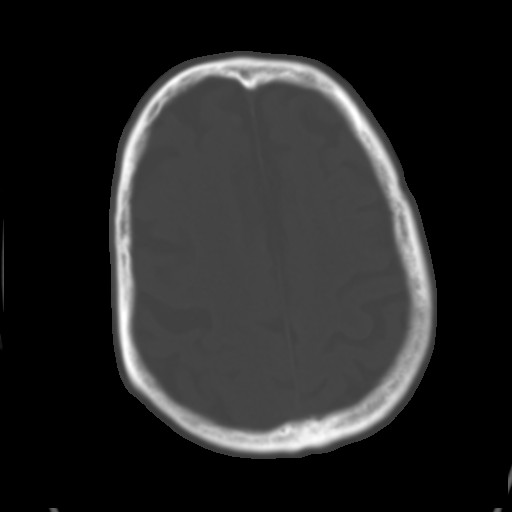
[im 27/32  brain]
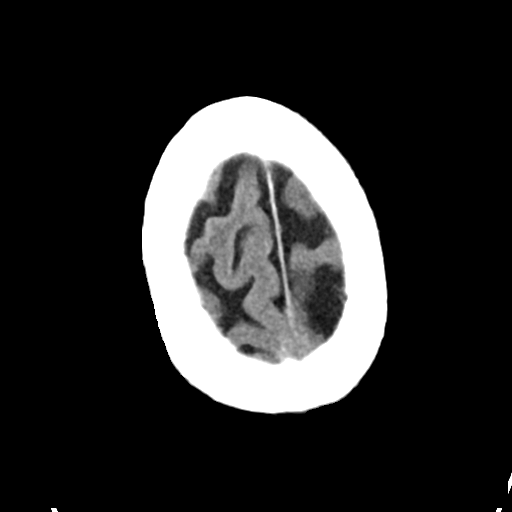

[Series 4: head bone · axial · 0.40mm/px · z∈[-134,-58]mm · 5 of 81 slices shown]
[im 8/81  bone]
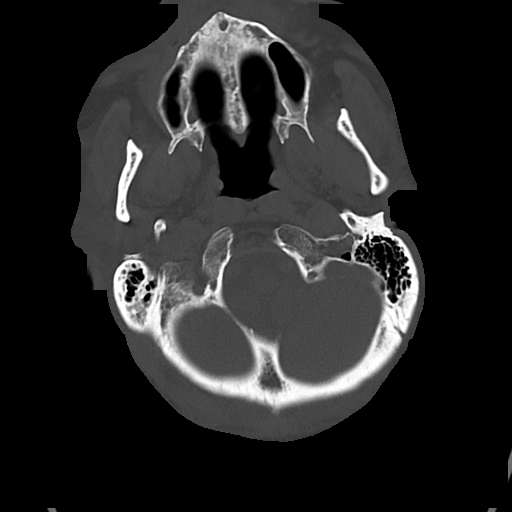
[im 16/81  bone]
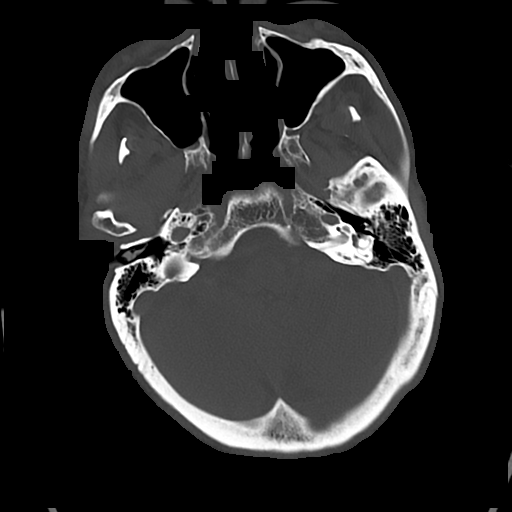
[im 27/81  bone]
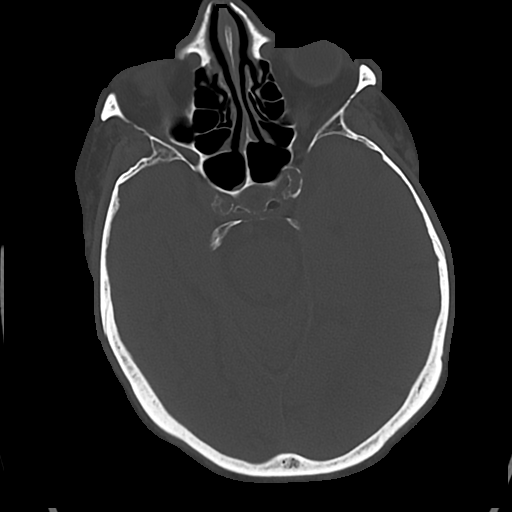
[im 35/81  bone]
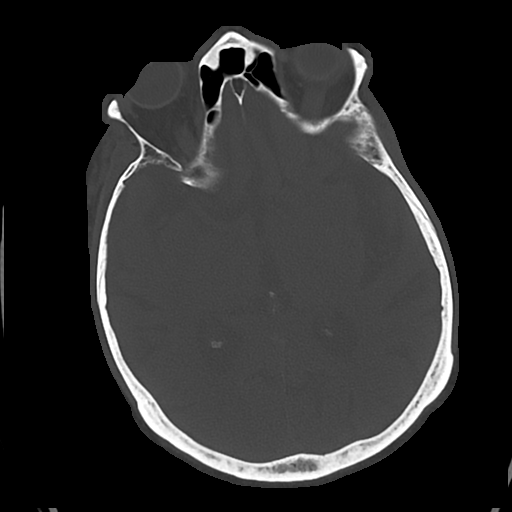
[im 46/81  bone]
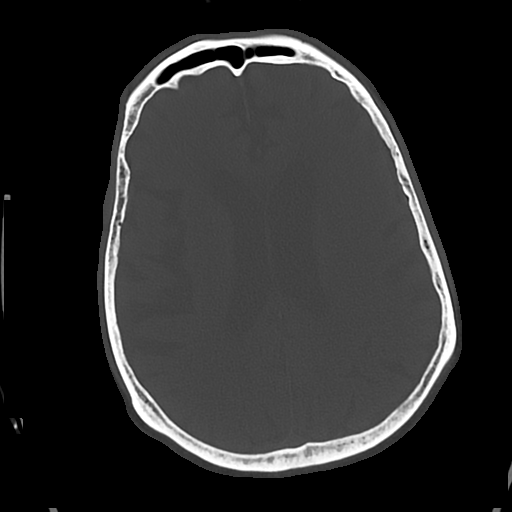

[Series 5: cor soft · coronal · 0.31mm/px · 3 of 69 slices shown]
[im 23/69  brain]
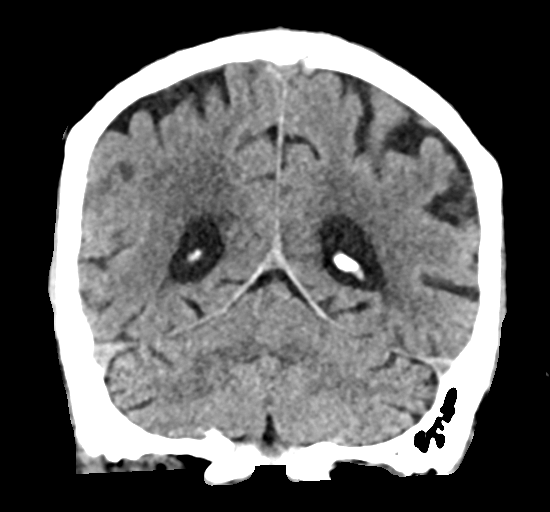
[im 31/69  brain]
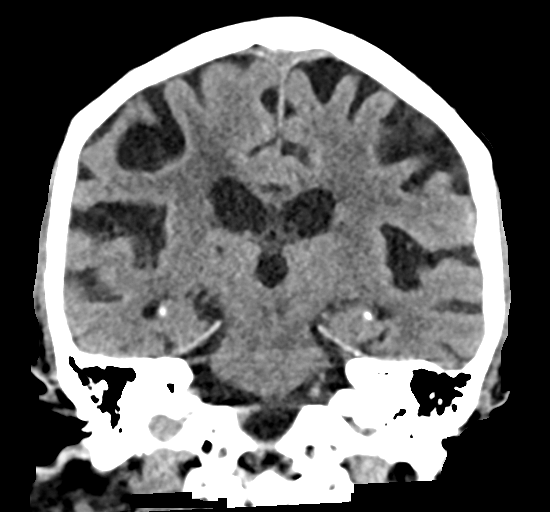
[im 38/69  brain]
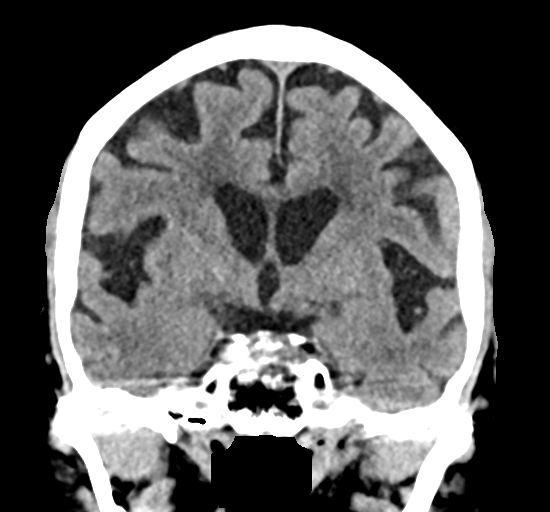

[Series 6: sag soft · sagittal · 0.35mm/px · 3 of 55 slices shown]
[im 19/55  brain]
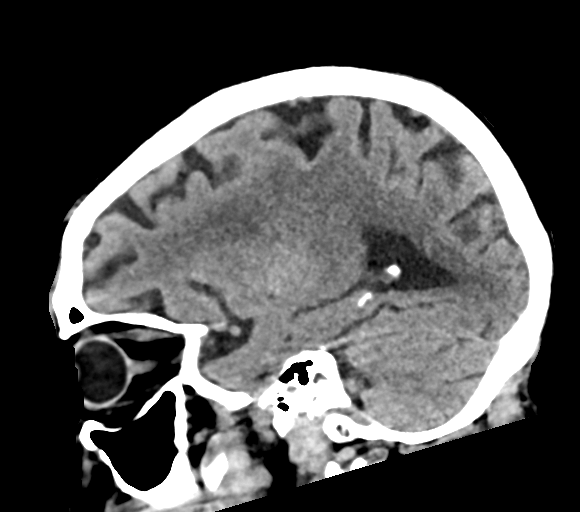
[im 28/55  brain]
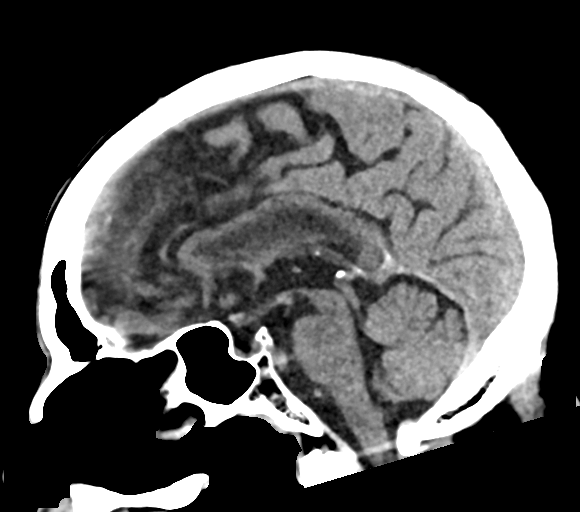
[im 37/55  brain]
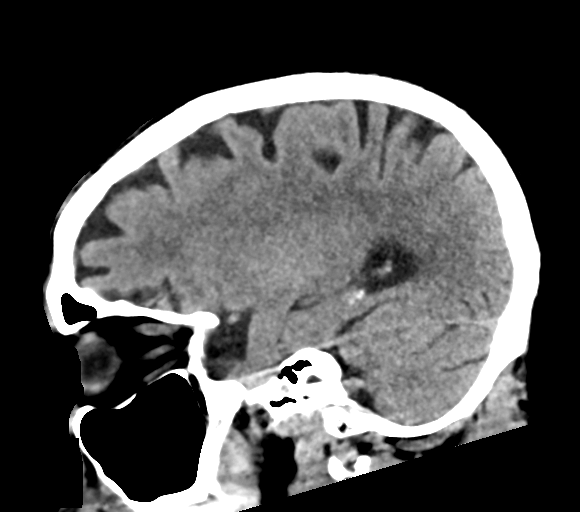

[17 of 47 positions shown; findings below may reference images not displayed]

FINDINGS: Brain: No evidence of acute infarction, hemorrhage, hydrocephalus,
extra-axial collection or mass lesion/mass effect. Chronic small
vessel ischemia in the cerebral white matter to a moderate degree.
Moderate brain atrophy

Vascular: Atherosclerotic calcification

Skull: Normal. Negative for fracture or focal lesion.

Sinuses/Orbits: No acute finding.

Other: These results were communicated to Dr. Abde Nabi at [DATE]
[place] 03/24/2020by text page via the [employer] messaging system.

ASPECTS (Alberta Stroke Program Early CT Score)

- Ganglionic level infarction (caudate, lentiform nuclei, internal
capsule, insula, M1-M3 cortex): 7

- Supraganglionic infarction (M4-M6 cortex): 3

Total score (0-10 with 10 being normal): 10
IMPRESSION: Senescent changes without acute finding.

## 2021-06-01 IMAGING — DX DG ABDOMEN 1V
1 series · 1 of 1 positions shown · non-contrast
Comparison: None.

CLINICAL DATA: High creatinine

EXAM:
ABDOMEN - 1 VIEW

[abdomen kub]
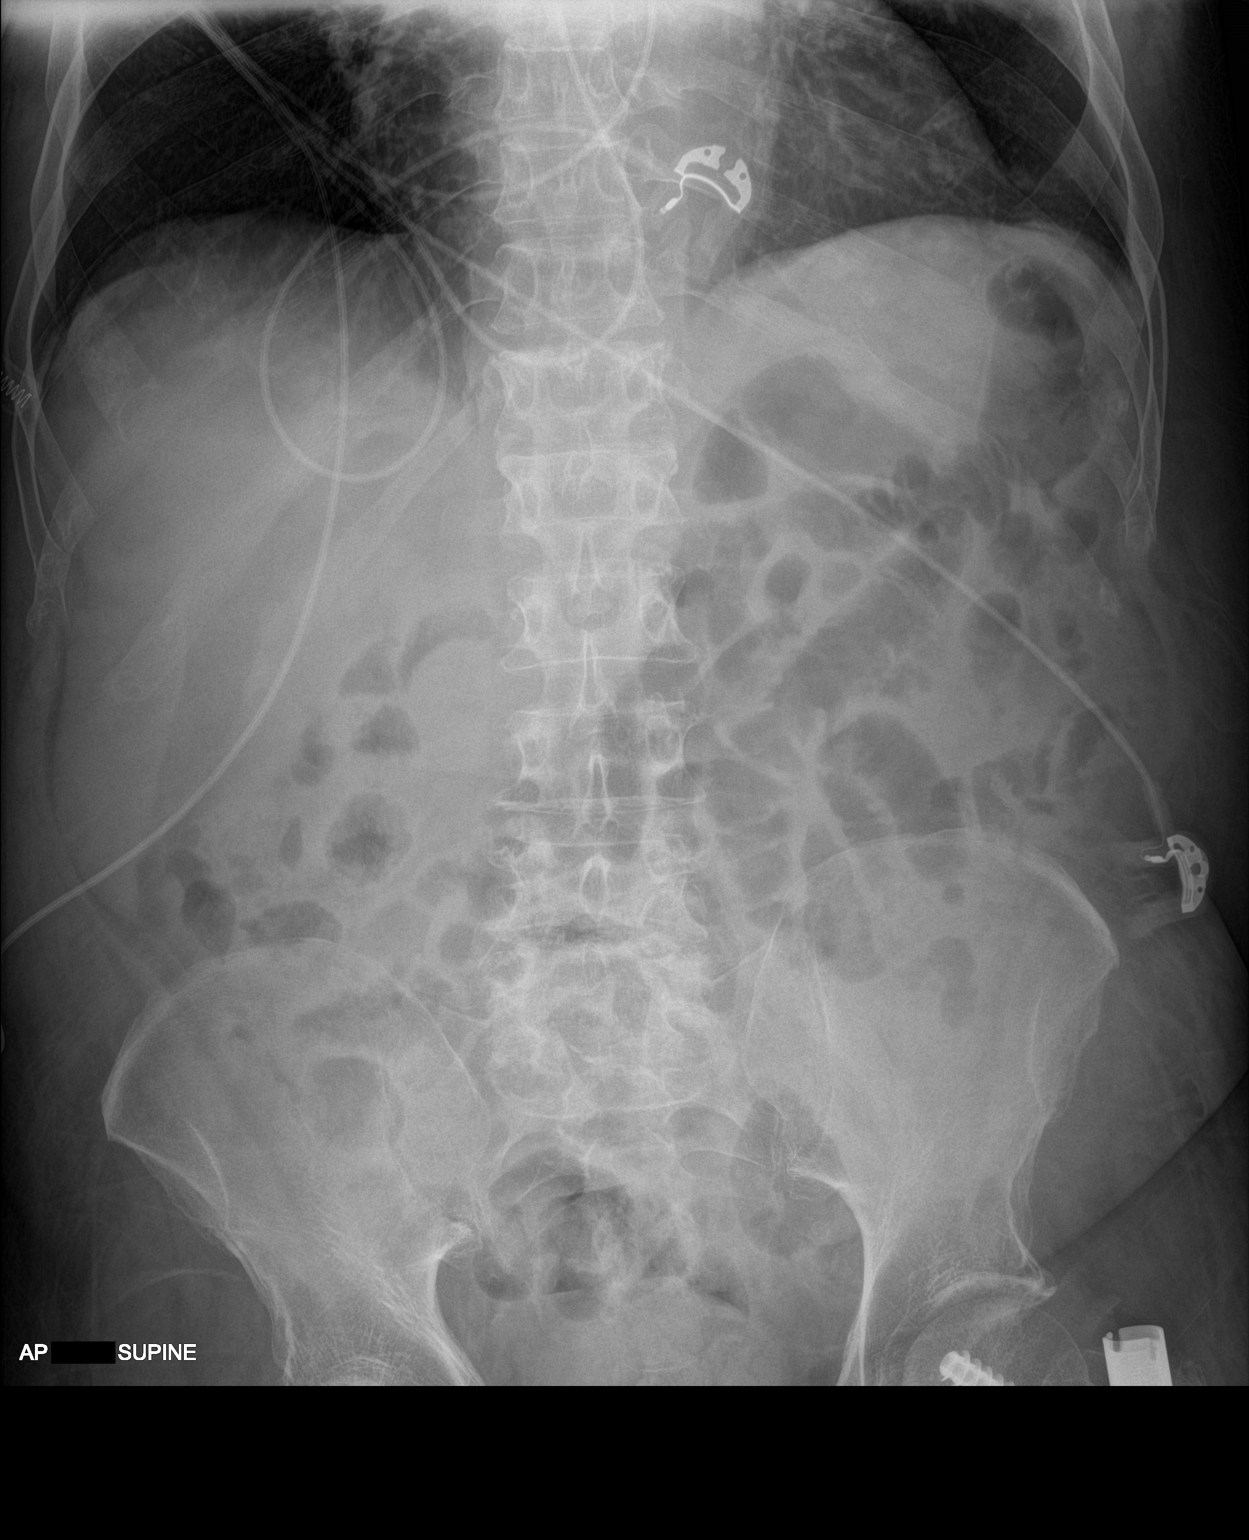

[1 of 1 positions shown; findings below may reference images not displayed]

FINDINGS: The bowel gas pattern is normal. No radio-opaque calculi or other
significant radiographic abnormality are seen.
IMPRESSION: Nonobstructive pattern of bowel gas. No free air in the abdomen on
supine radiograph. No radiopaque calculi noted radiographically; CT
is more sensitive for the detection of urinary tract calculi if
clinically suspected.

## 2021-06-21 IMAGING — CT CT HEAD W/O CM
3 of 4 series · 15 of 47 positions shown, 18 images · non-contrast
Comparison: Head CT scan 03/24/2020.

CLINICAL DATA: Status post fall.  Initial encounter.

EXAM:
CT HEAD WITHOUT CONTRAST
TECHNIQUE: Contiguous axial images were obtained from the base of the skull
through the vertex without intravenous contrast.

[Series 4: head wo · axial · 0.47mm/px · z∈[-125,-5]mm · 9 of 30 slices shown, 12 images]
[im 3/30  brain]
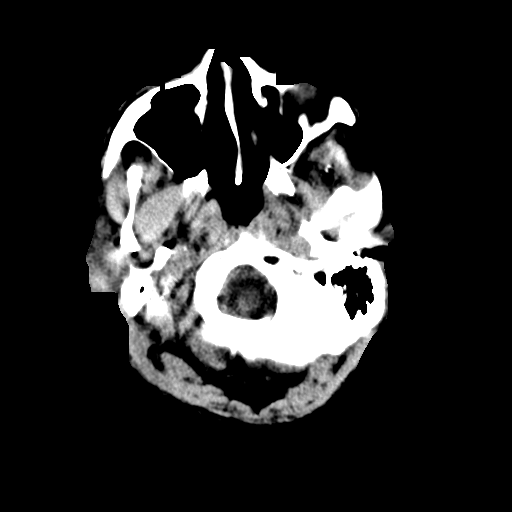
[im 3/30  bone]
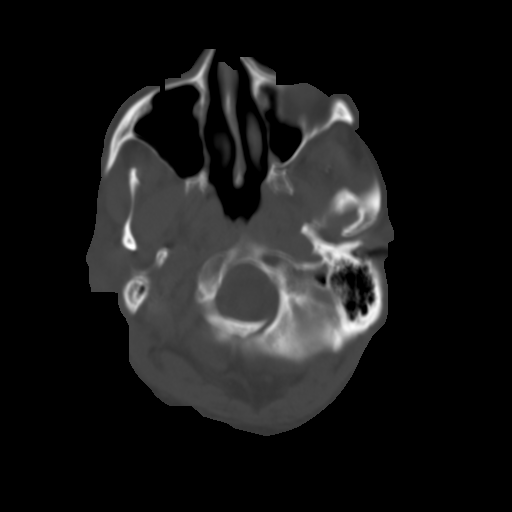
[im 6/30  brain]
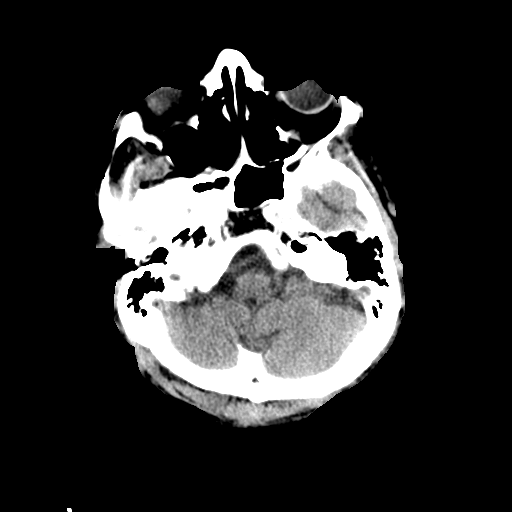
[im 8/30  brain]
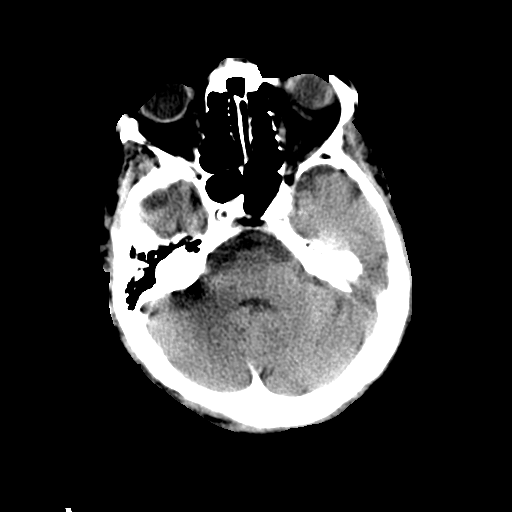
[im 12/30  brain]
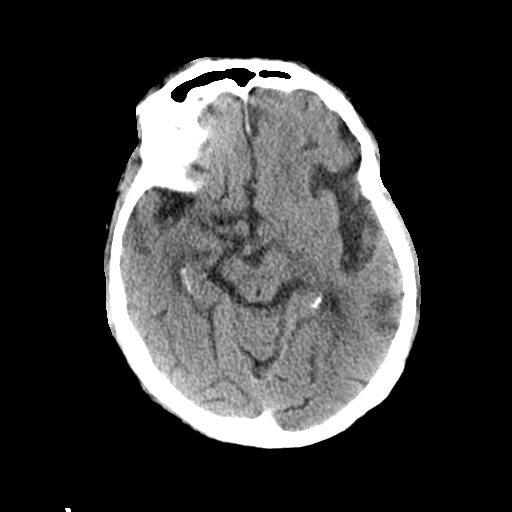
[im 15/30  brain]
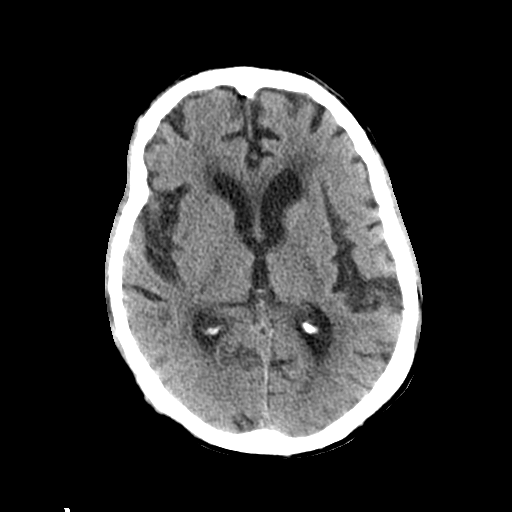
[im 15/30  bone]
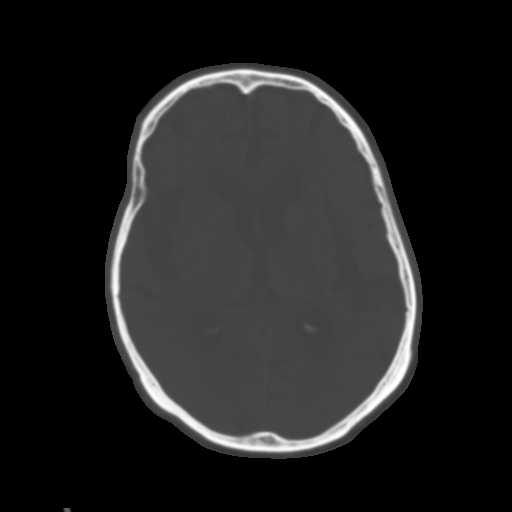
[im 18/30  brain]
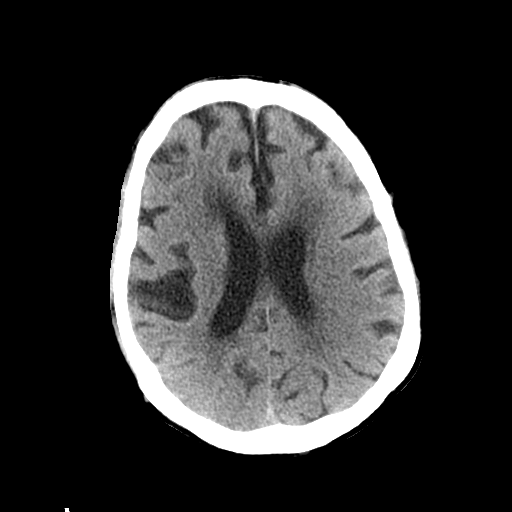
[im 22/30  brain]
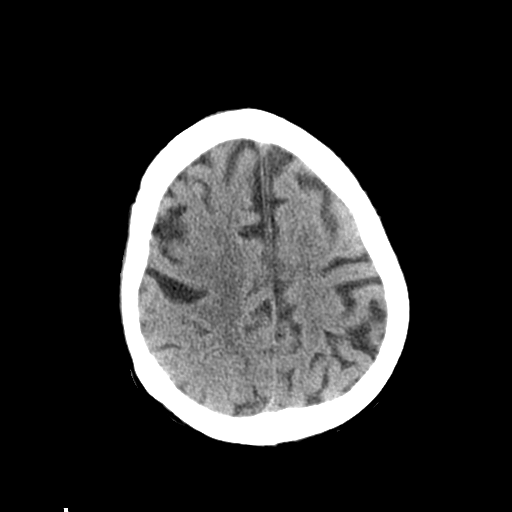
[im 24/30  brain]
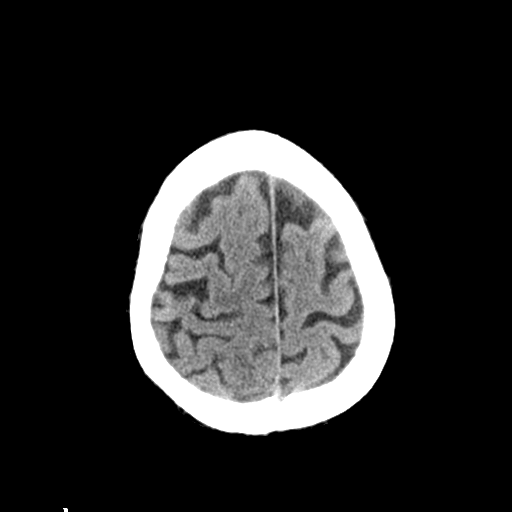
[im 27/30  brain]
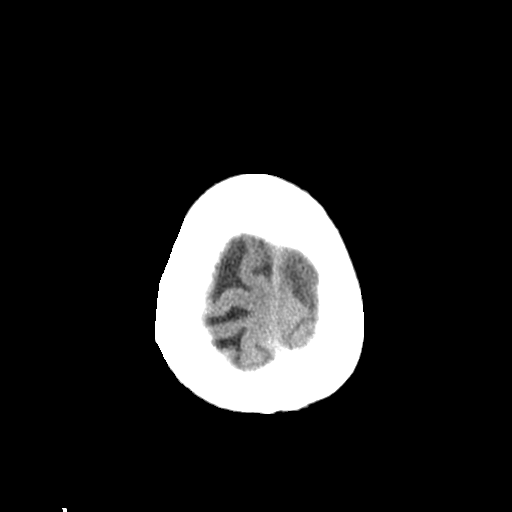
[im 27/30  bone]
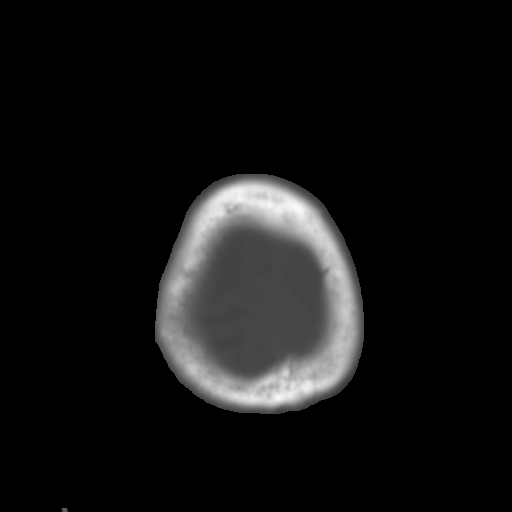

[Series 6: coronal soft tissue · coronal · 0.33mm/px · 3 of 66 slices shown]
[im 22/66  brain]
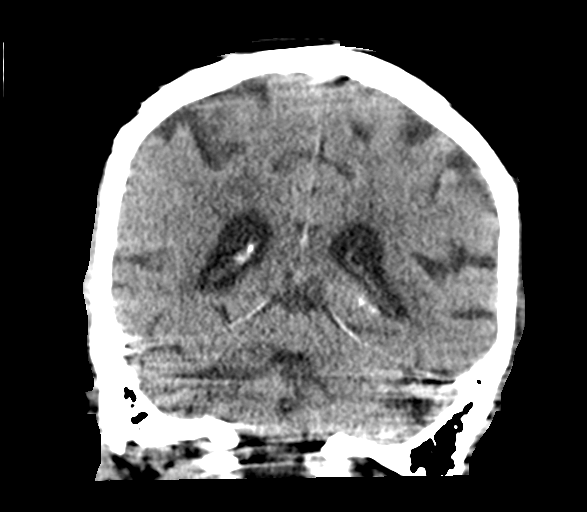
[im 29/66  brain]
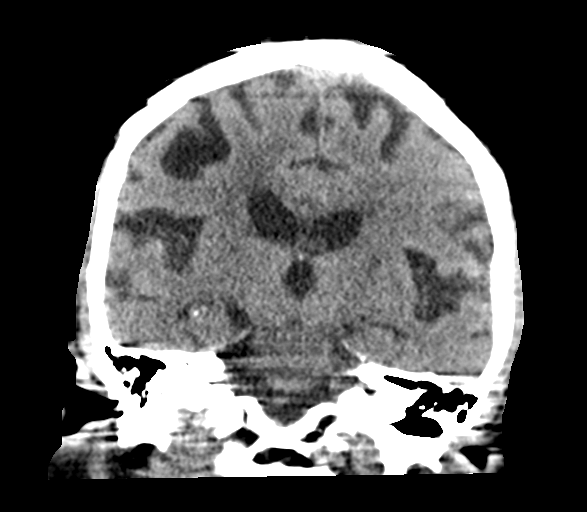
[im 37/66  brain]
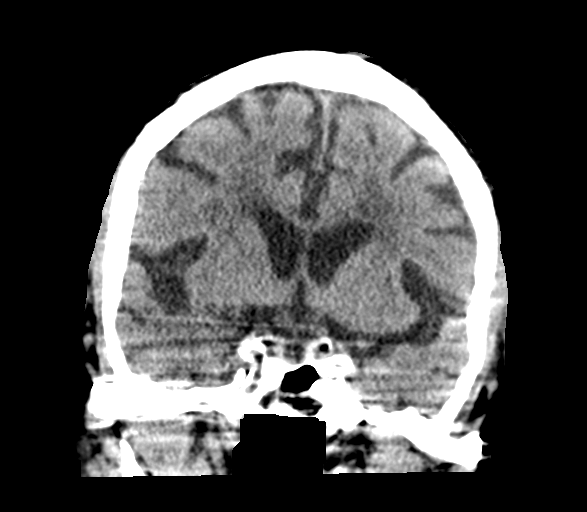

[Series 7: sagittal soft tissue · sagittal · 0.35mm/px · 3 of 54 slices shown]
[im 18/54  brain]
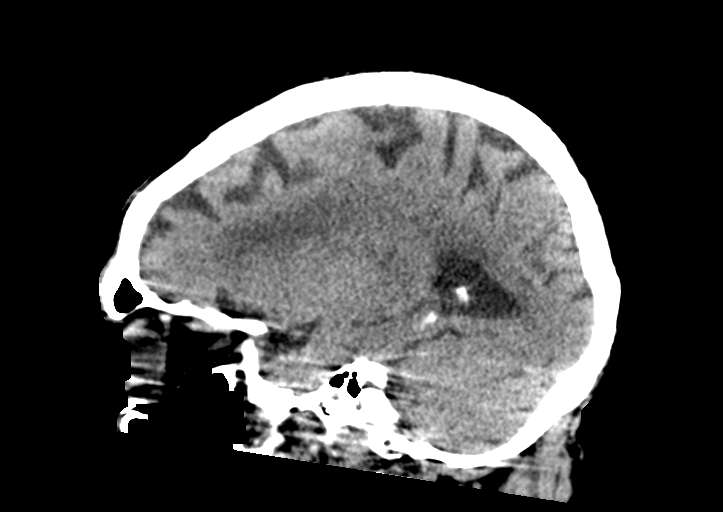
[im 27/54  brain]
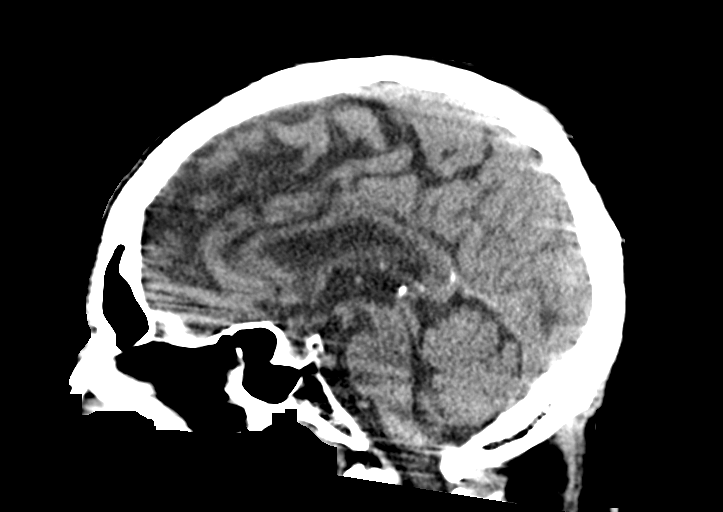
[im 36/54  brain]
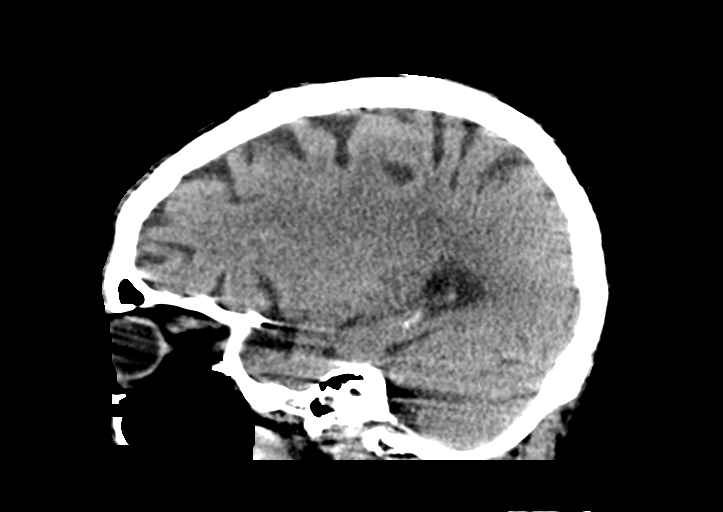

[15 of 47 positions shown; findings below may reference images not displayed]

FINDINGS: Brain: No evidence of acute infarction, hemorrhage, hydrocephalus,
extra-axial collection or mass lesion/mass effect. Atrophy and
chronic microvascular ischemic change again seen.

Vascular: No hyperdense vessel or unexpected calcification.

Skull: Intact.  No focal lesion.

Sinuses/Orbits: Negative.

Other: None.
IMPRESSION: No acute abnormality.

Atrophy and chronic microvascular ischemic change.

## 2021-06-21 IMAGING — DX DG CHEST 1V PORT
1 series · 1 of 1 positions shown · non-contrast
Comparison: March 24, 2020

CLINICAL DATA: Hyperglycemia

EXAM:
PORTABLE CHEST 1 VIEW

[chest ap]
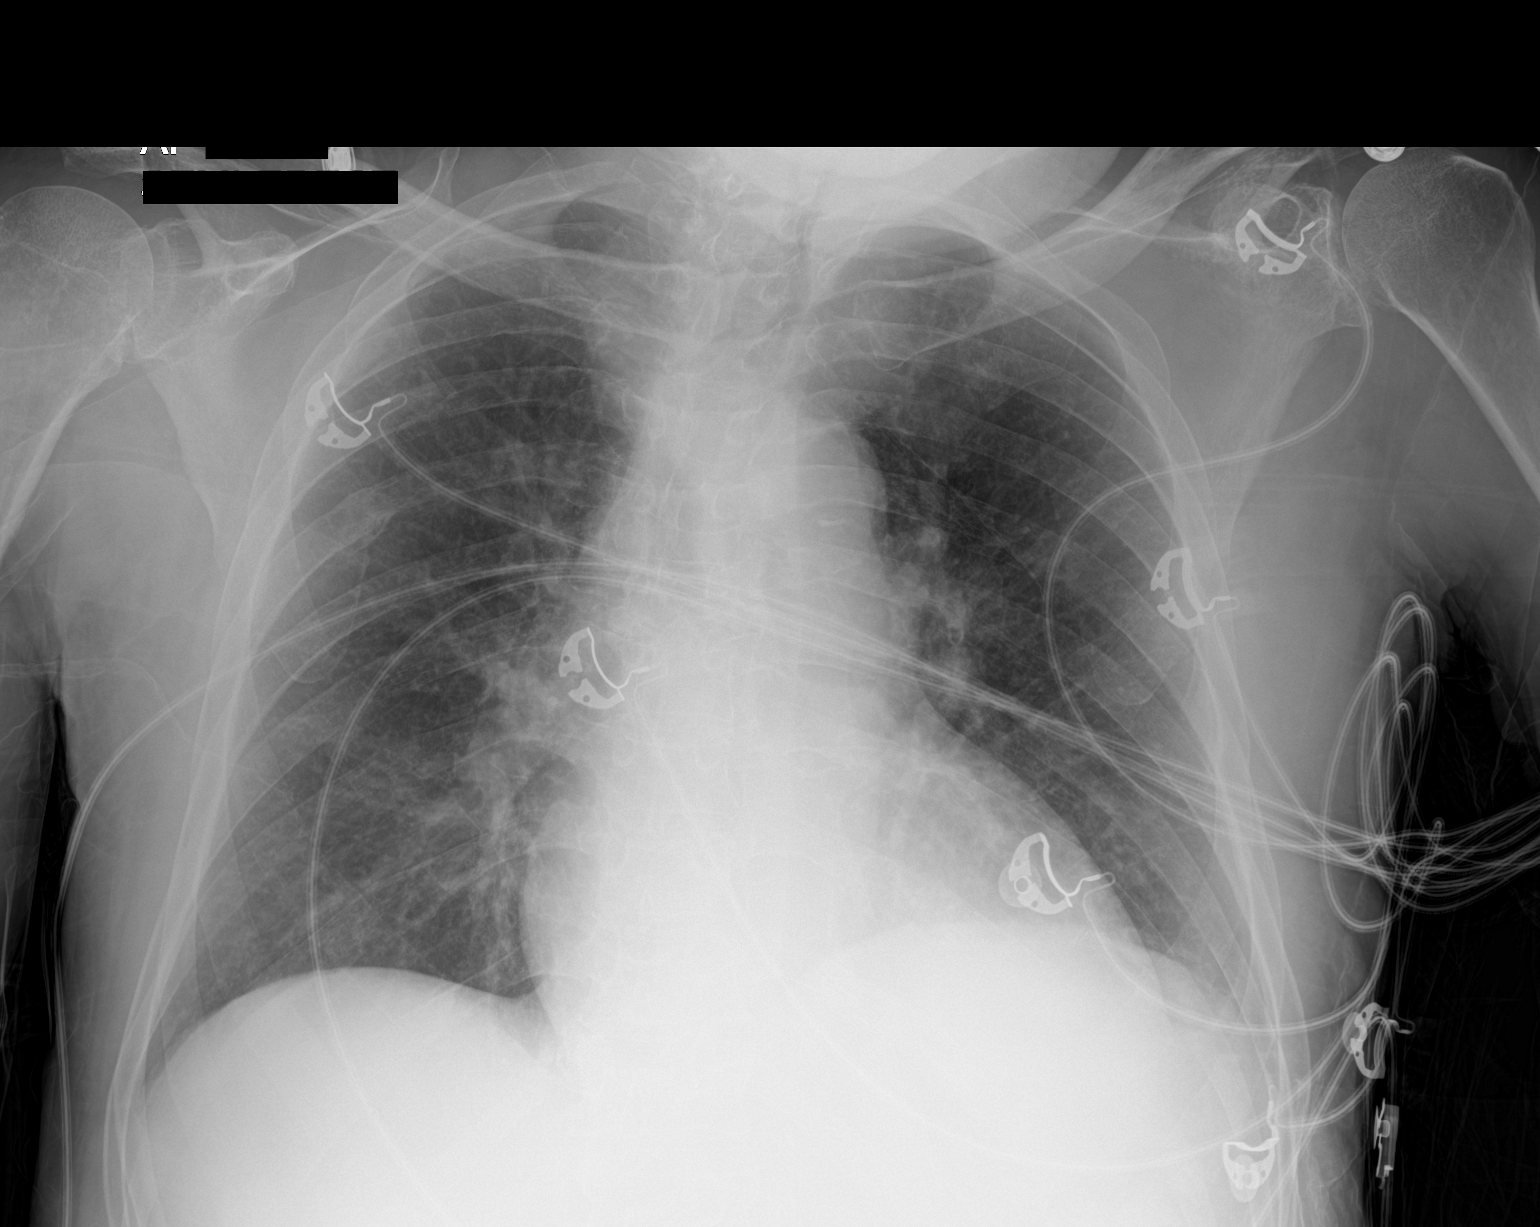

[1 of 1 positions shown; findings below may reference images not displayed]

FINDINGS: The mediastinal contour and cardiac silhouette are stable. There is
no focal infiltrate, pulmonary edema, or pleural effusion. The
visualized bony structures are unremarkable.
IMPRESSION: No active disease.

## 2021-06-29 IMAGING — DX DG CHEST 1V PORT
1 series · 1 of 1 positions shown · non-contrast
Comparison: 04/14/2020

CLINICAL DATA: Hyperglycemia, tachypnea

EXAM:
PORTABLE CHEST 1 VIEW

[chest ap]
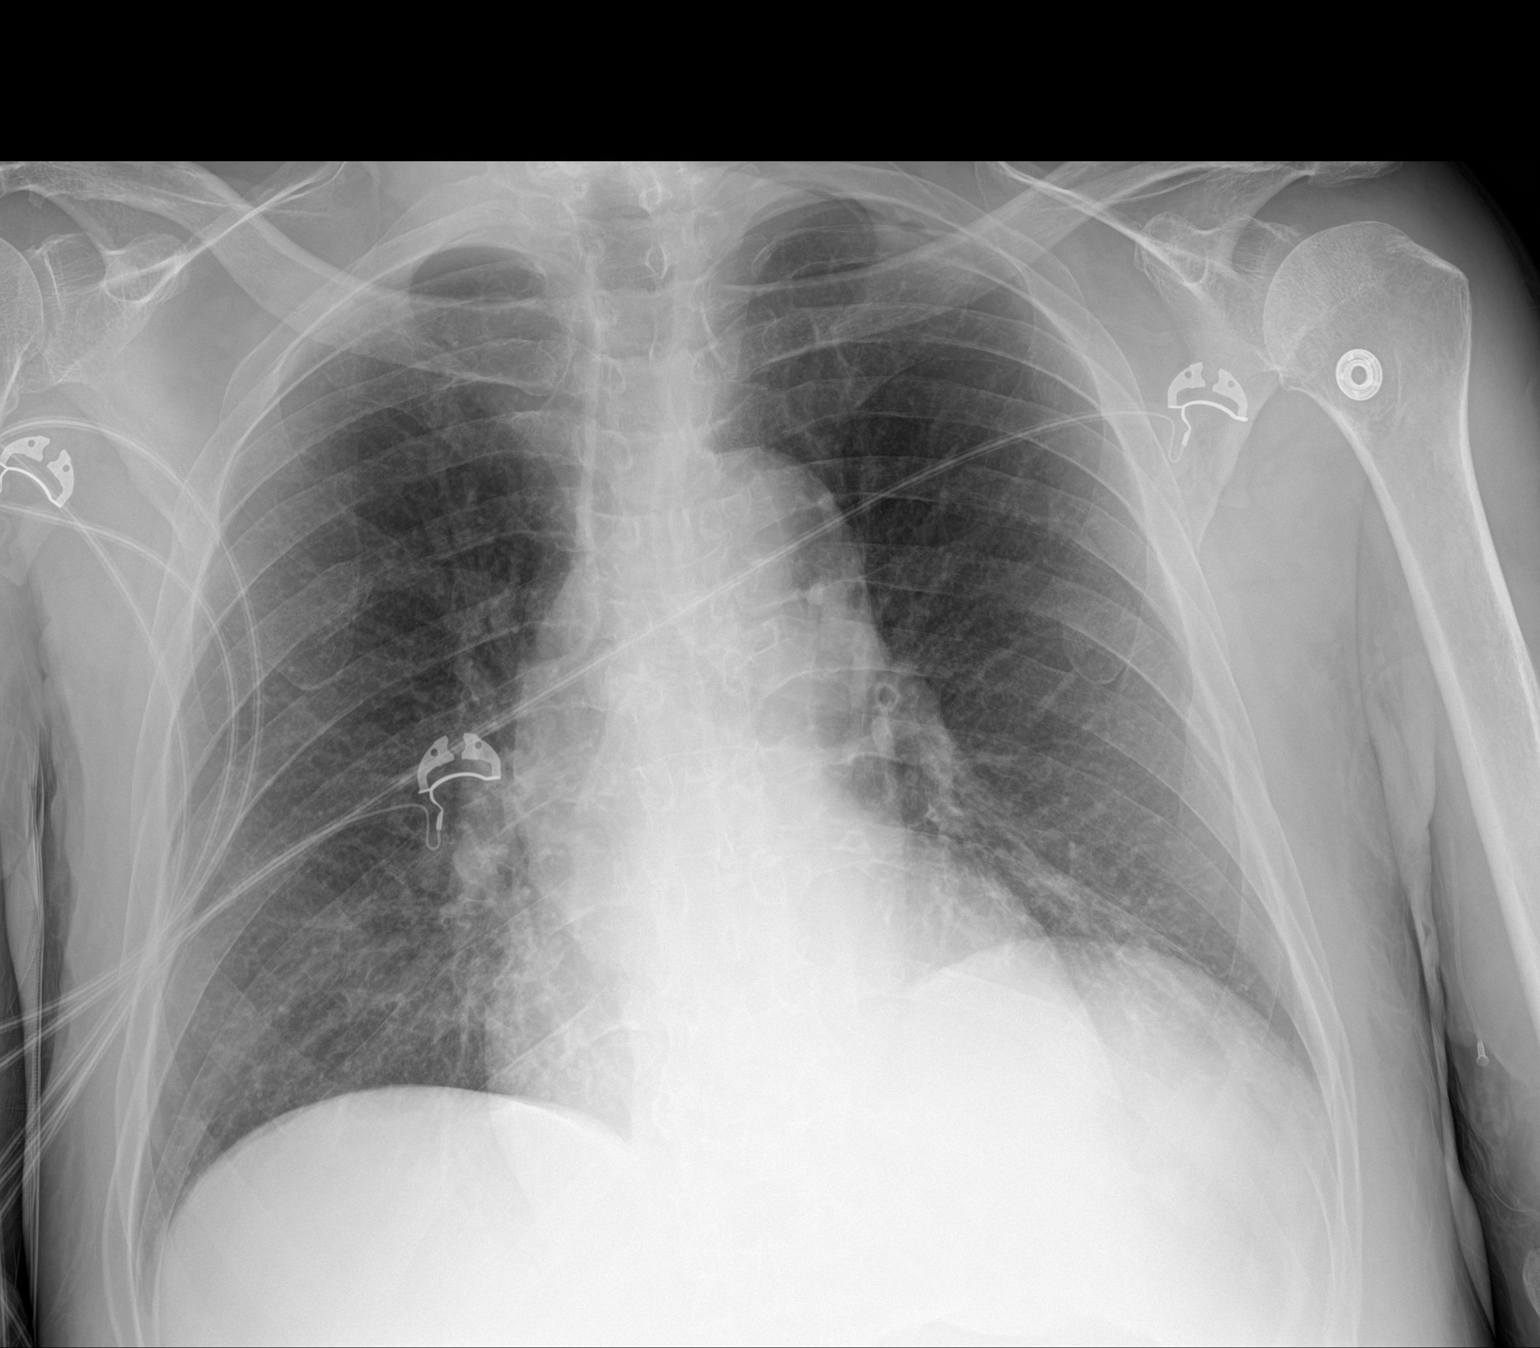

[1 of 1 positions shown; findings below may reference images not displayed]

FINDINGS: The heart size and mediastinal contours are within normal limits.
Both lungs are clear. The visualized skeletal structures are
unremarkable.
IMPRESSION: No active disease.

## 2021-08-17 IMAGING — DX DG CHEST 1V PORT
1 series · 1 of 1 positions shown · non-contrast
Comparison: Chest radiograph dated 04/22/2020

CLINICAL DATA: Dyspnea

EXAM:
PORTABLE CHEST 1 VIEW

[chest ap]
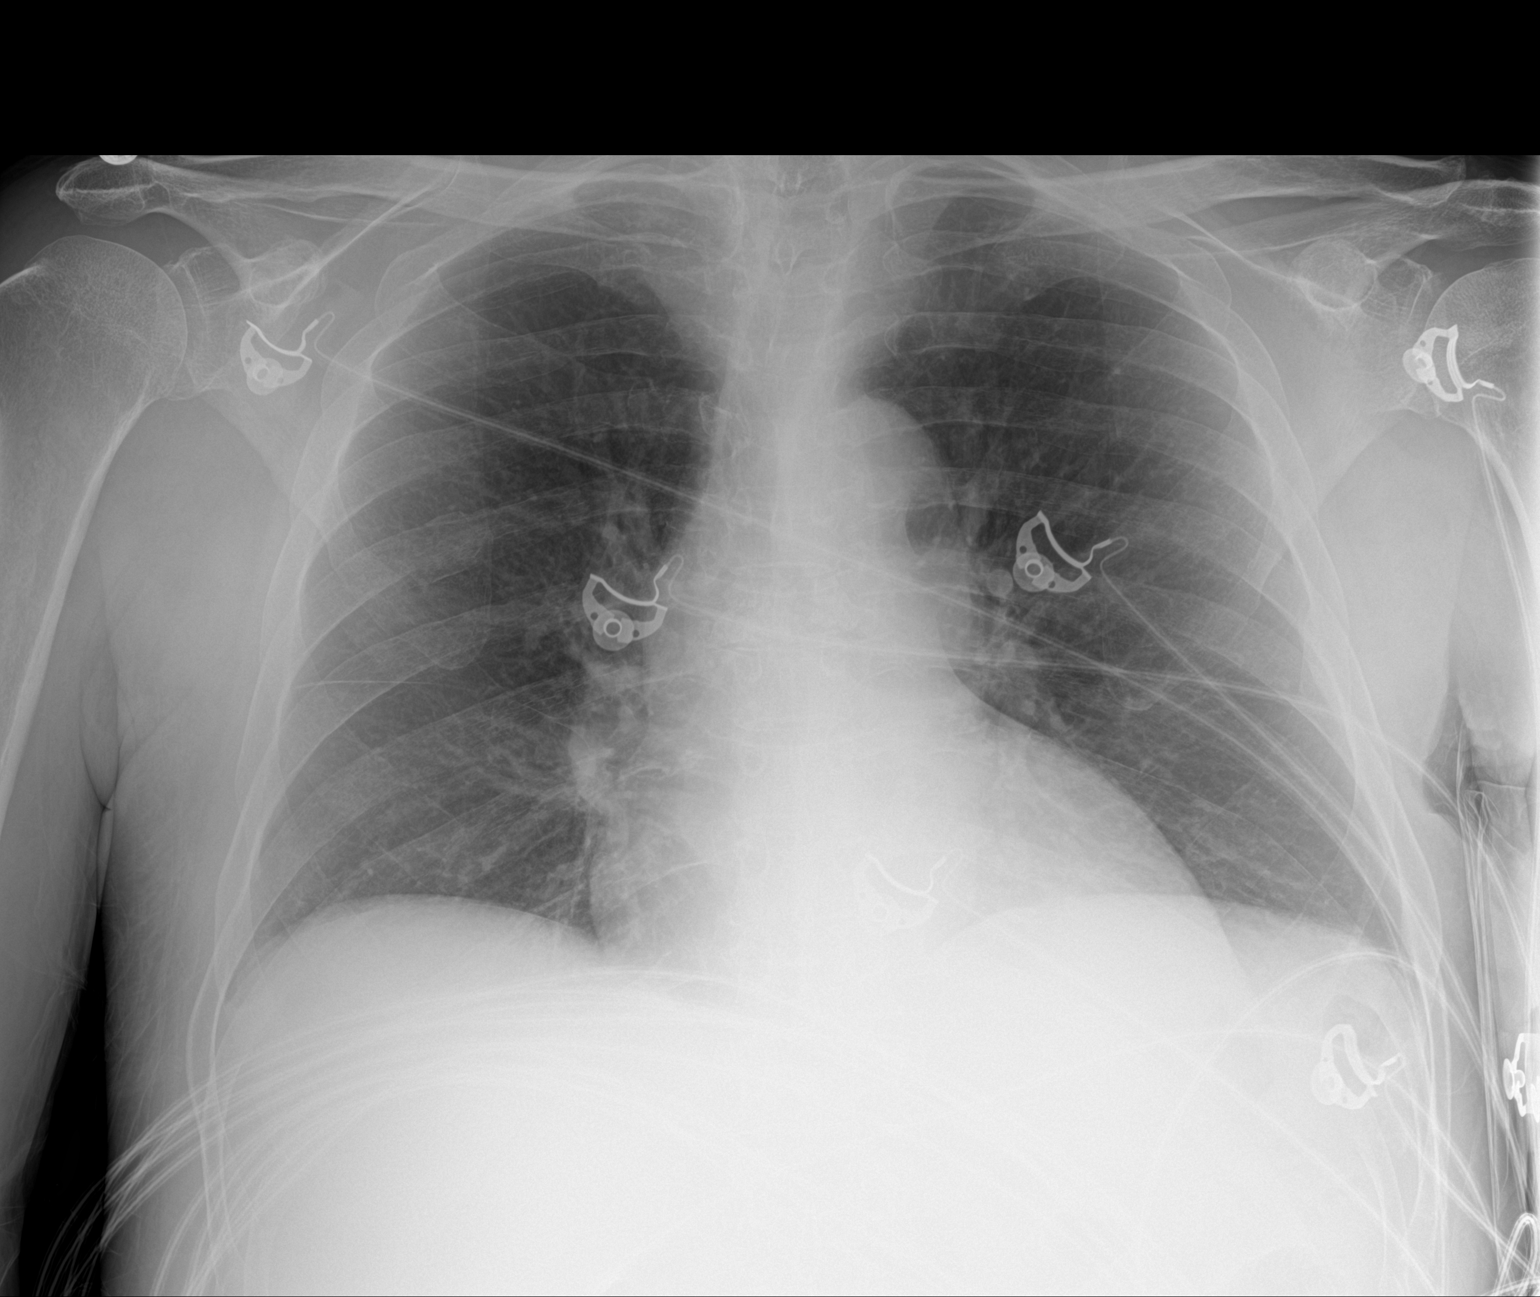

[1 of 1 positions shown; findings below may reference images not displayed]

FINDINGS: The heart size and mediastinal contours are within normal limits.
Both lungs are clear. The visualized skeletal structures are
unremarkable.
IMPRESSION: No active disease.

## 2021-09-12 IMAGING — CT CT CHEST W/O CM
2 of 4 series · 15 of 36 positions shown, 18 images · non-contrast
Comparison: 07/06/2020

CLINICAL DATA: Fell yesterday, right posterior rib pain

EXAM:
CT CHEST WITHOUT CONTRAST
TECHNIQUE: Multidetector CT imaging of the chest was performed following the
standard protocol without IV contrast. Unenhanced CT was performed
per clinician order. Lack of IV contrast limits sensitivity and
specificity, especially for evaluation of vascular or mediastinal
injury.

[Series 4: thorax 2.0 · axial · 0.78mm/px · z∈[-284,-46]mm · 12 of 135 slices shown, 15 images]
[im 8/135  mediastinal]
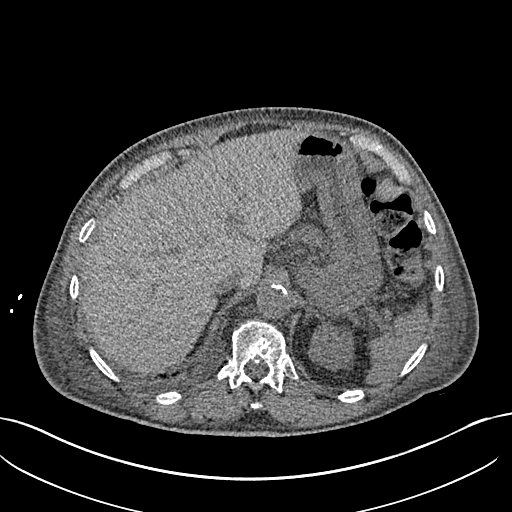
[im 8/135  lung]
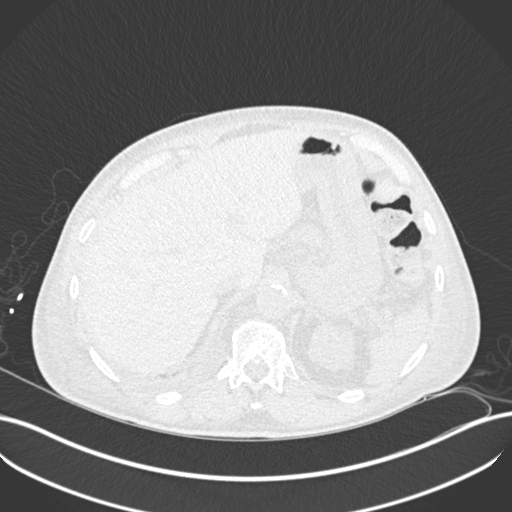
[im 22/135  lung]
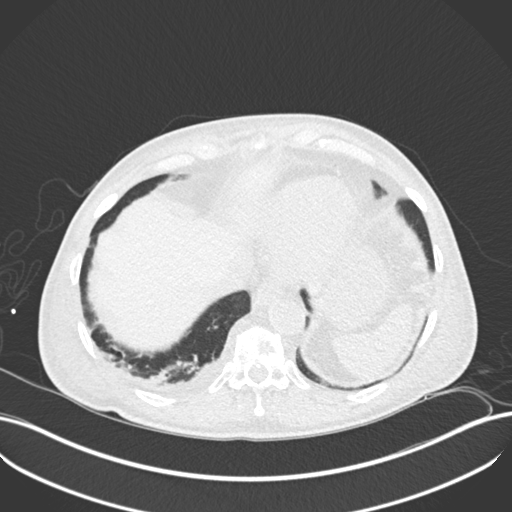
[im 29/135  lung]
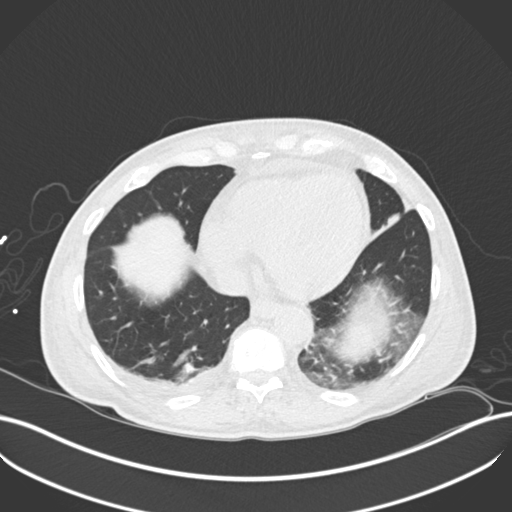
[im 43/135  lung]
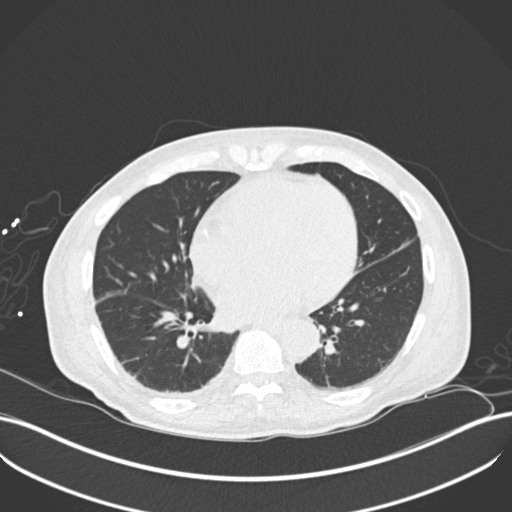
[im 50/135  mediastinal]
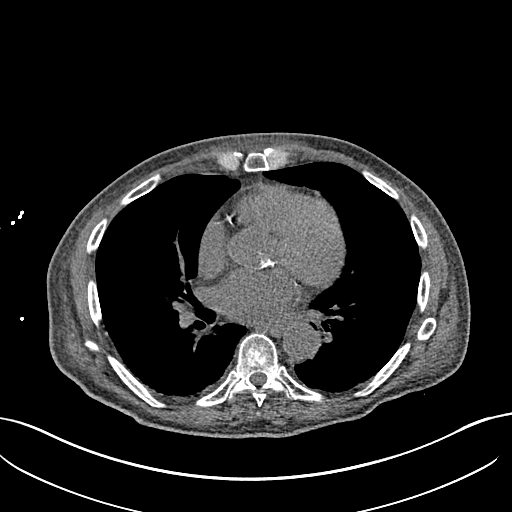
[im 50/135  lung]
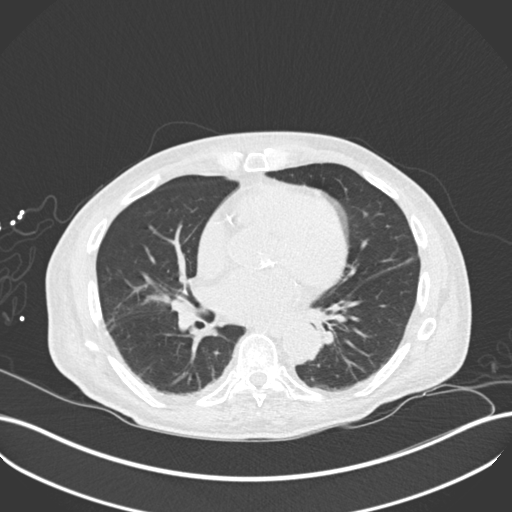
[im 64/135  lung]
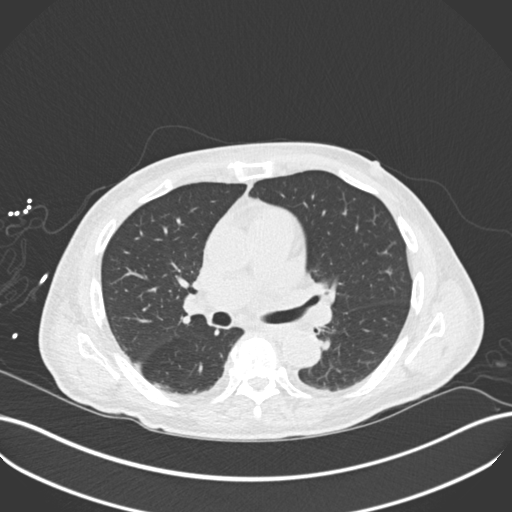
[im 71/135  lung]
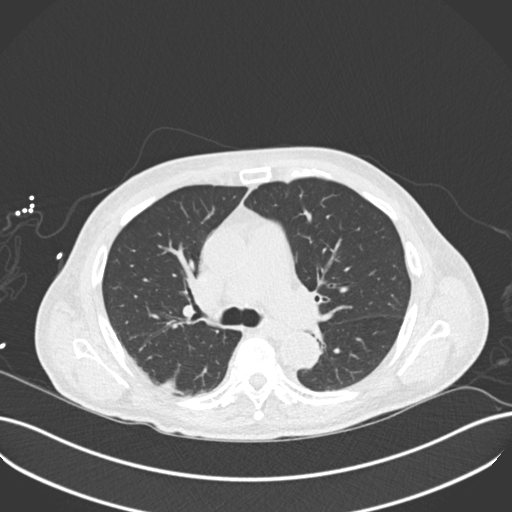
[im 85/135  lung]
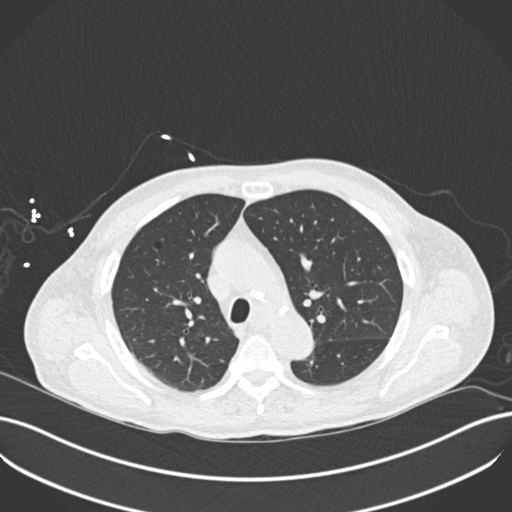
[im 92/135  mediastinal]
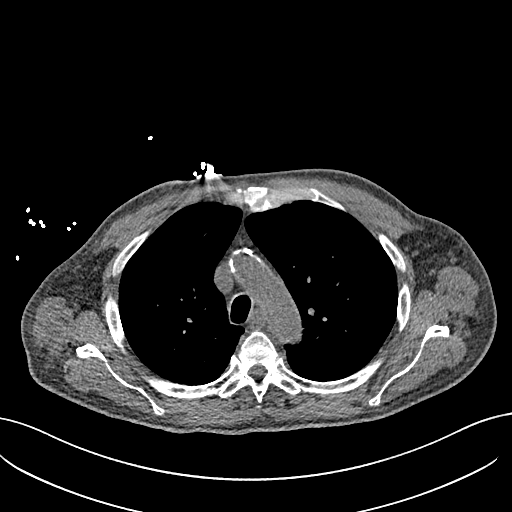
[im 92/135  lung]
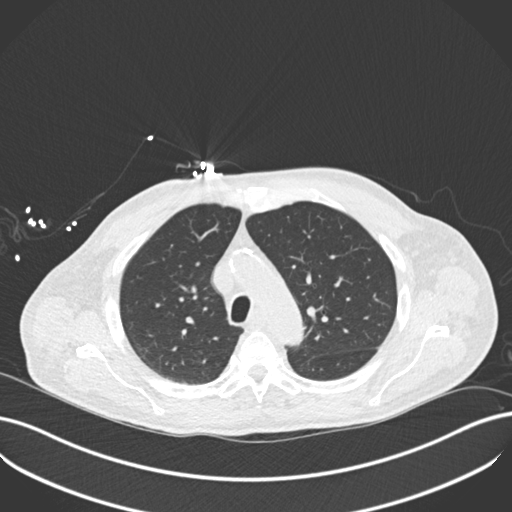
[im 106/135  lung]
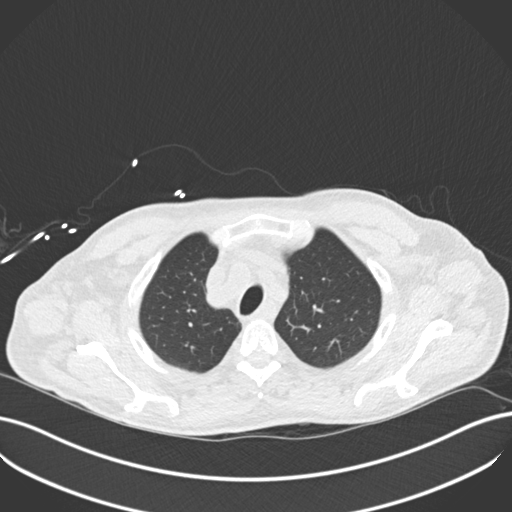
[im 113/135  lung]
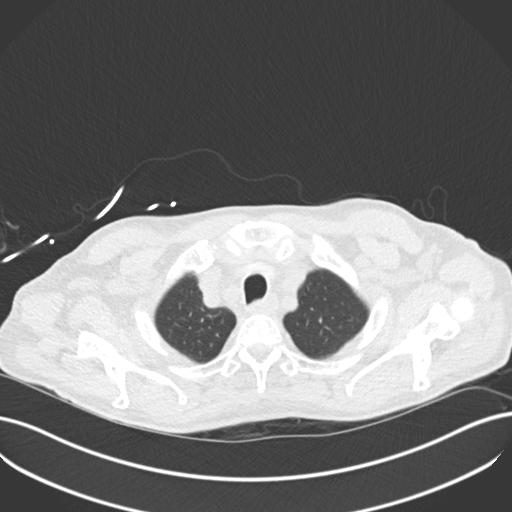
[im 127/135  lung]
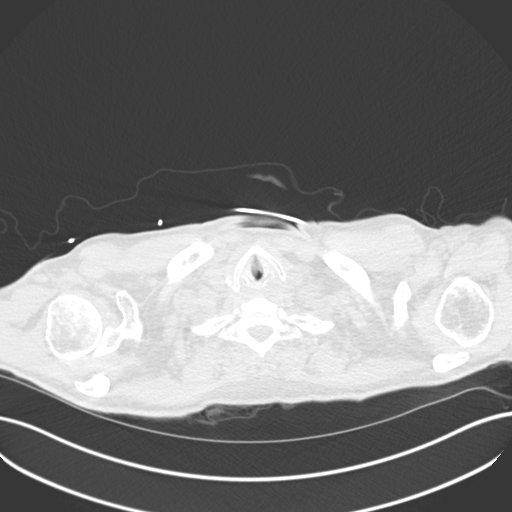

[Series 7: coronal · coronal · 0.58mm/px · 3 of 85 slices shown]
[im 17/85  lung]
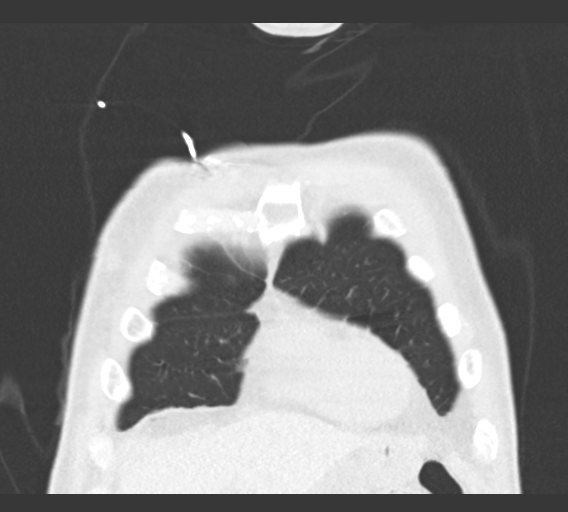
[im 34/85  lung]
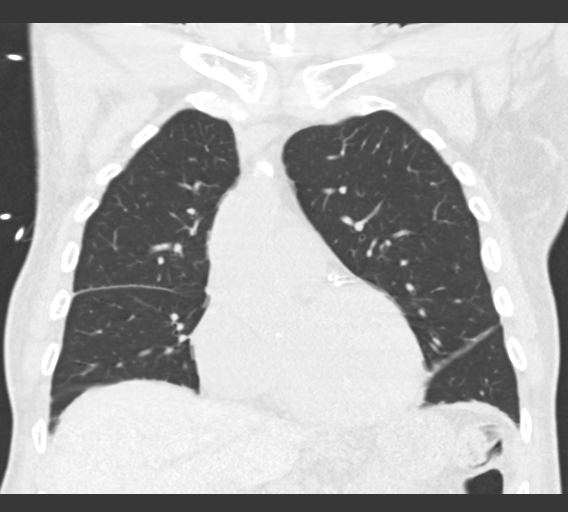
[im 51/85  lung]
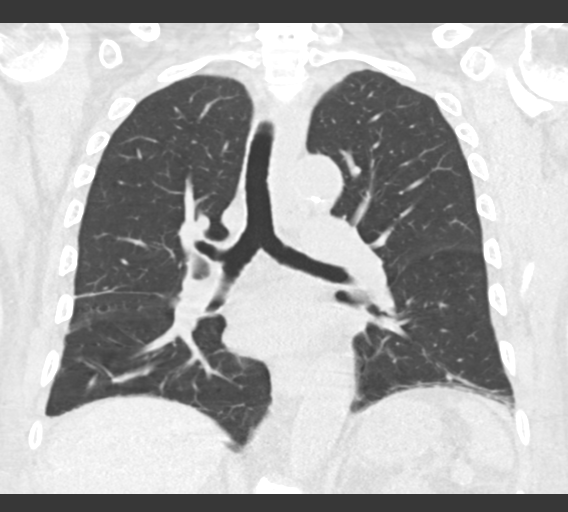

[15 of 36 positions shown; findings below may reference images not displayed]

FINDINGS: Cardiovascular: Unenhanced imaging of the heart and great vessels
demonstrates trace pericardial fluid. Normal caliber of the thoracic
aorta. Moderate atherosclerosis of the aortic arch, with stable
severe atherosclerosis of the coronary vasculature.

Mediastinum/Nodes: No enlarged mediastinal or axillary lymph nodes.
Thyroid gland, trachea, and esophagus demonstrate no significant
findings.

Lungs/Pleura: Trace right pleural effusion. No airspace disease or
pneumothorax. Central airways are patent.

Upper Abdomen: No acute abnormality.

Musculoskeletal: There are chronic appearing compression deformities
at T6, T7, T11, and T12. No acute displaced fractures. Reconstructed
images demonstrate no additional findings.
IMPRESSION: 1. Chronic appearing compression fractures at T6, T7, T11, and T12.
Similar appearance to chest x-ray performed 01/27/2019. If further
evaluation is clinically warranted, MRI or bone scan could be
performed to assess fracture acuity.
2. Trace right pleural effusion.
3. Aortic Atherosclerosis ([id number].B). Severe coronary artery
atherosclerosis.

## 2021-09-12 IMAGING — CR DG CHEST 2V
2 series · 2 of 2 positions shown · non-contrast
Comparison: None.

CLINICAL DATA: Left posterior rib pain after fall.

EXAM:
CHEST - 2 VIEW

[chest lat]
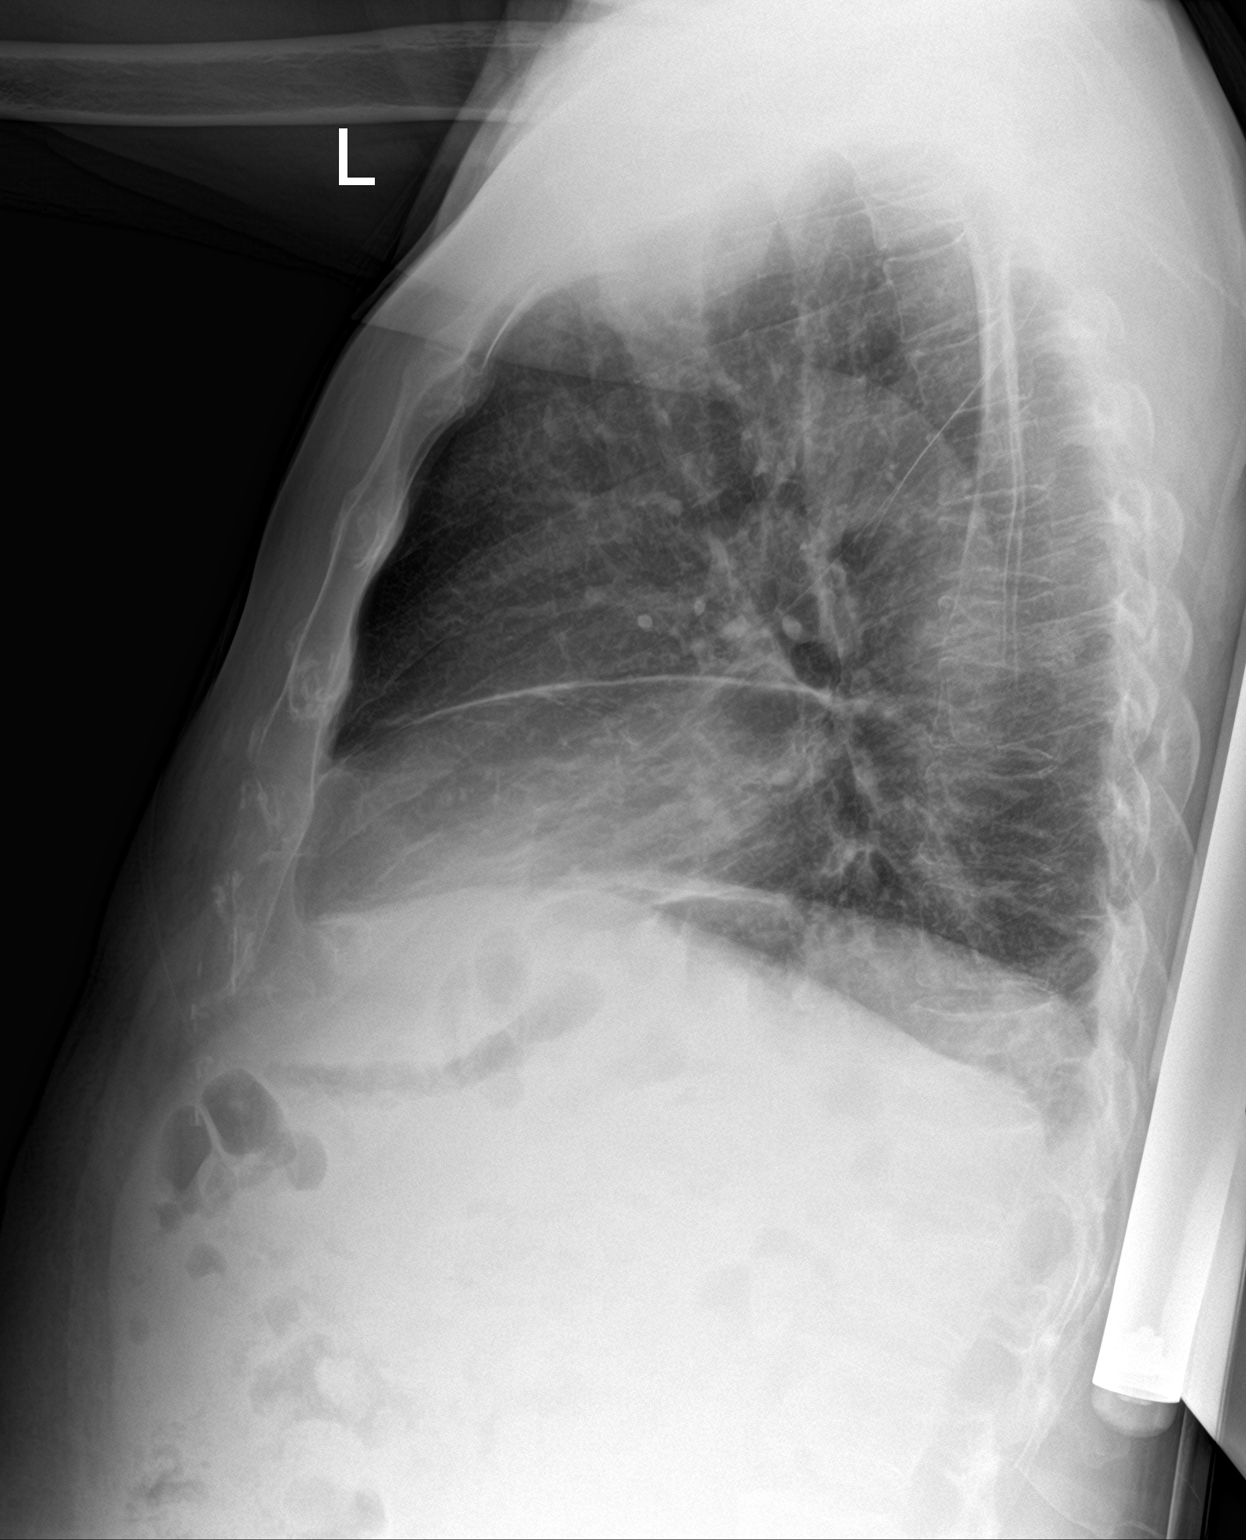

[chest ap]
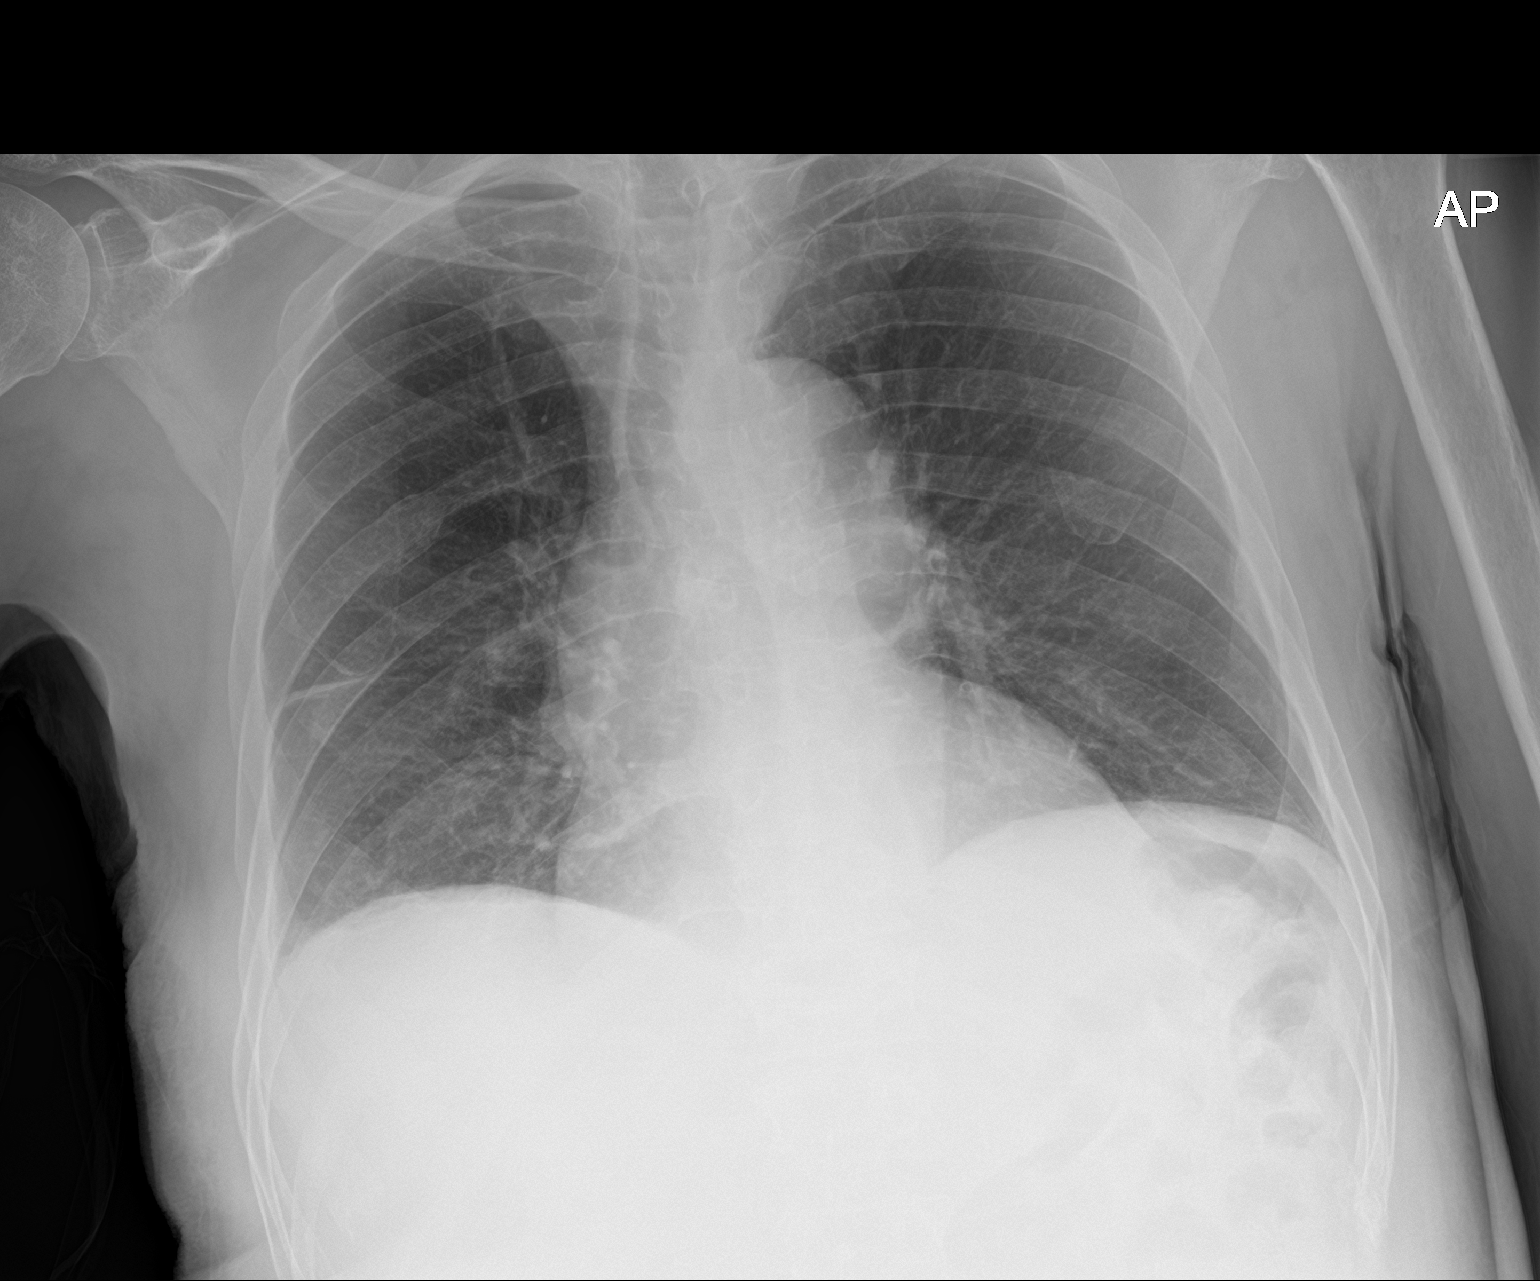

[2 of 2 positions shown; findings below may reference images not displayed]

FINDINGS: The heart size and mediastinal contours are within normal limits. No
consolidation. Small amount of fluid in the right major fissure
versus pleural thickening. No discernible pneumothorax. No evidence
of acute displaced fracture. Remote right posterior sixth rib
fracture with bony callus. Mild height loss of midthoracic vertebral
bodies, similar to prior from January 27, 2019. Osteopenia.
IMPRESSION: Small amount of fluid in the right major fissure versus pleural
thickening. Otherwise, no acute cardiopulmonary disease.

## 2021-10-21 IMAGING — DX DG CHEST 1V PORT
1 series · 1 of 1 positions shown · non-contrast
Comparison: 07/06/2020

CLINICAL DATA: Dyspnea on exertion

EXAM:
PORTABLE CHEST 1 VIEW

[chest ap]
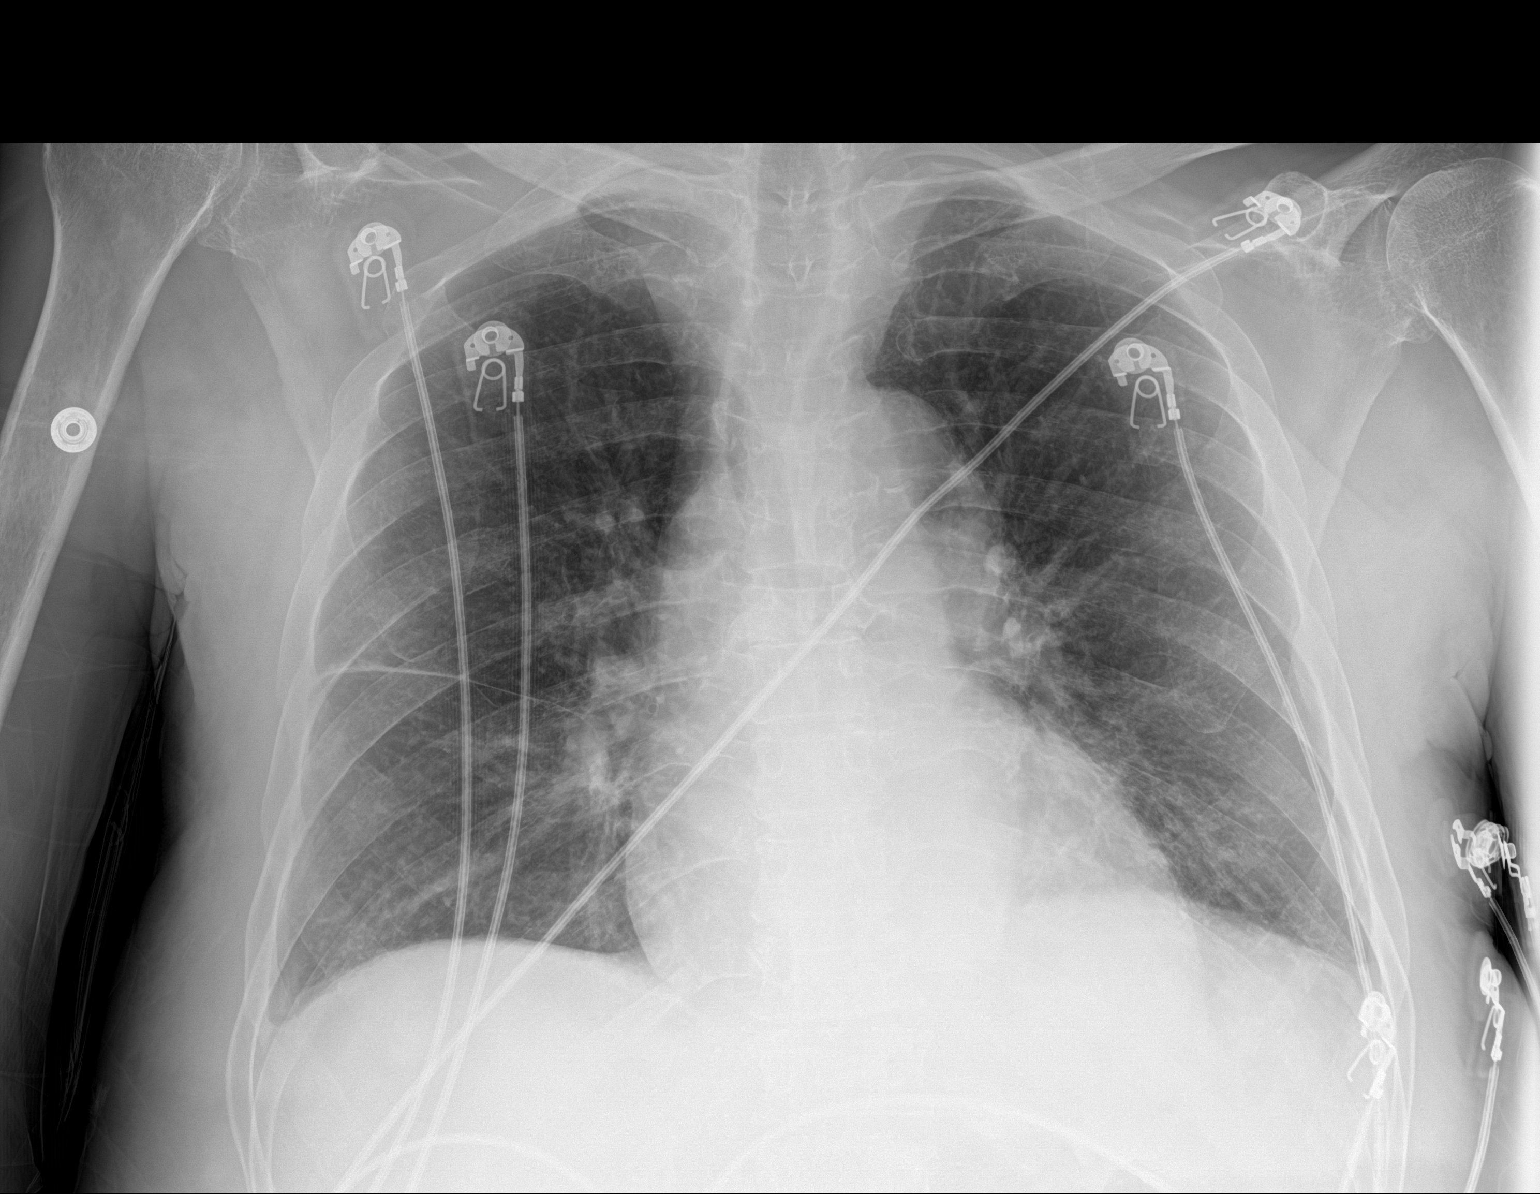

[1 of 1 positions shown; findings below may reference images not displayed]

FINDINGS: The heart size and mediastinal contours are within normal limits.
Persistent mild elevation of the left hemidiaphragm. No pleural
effusion. The visualized skeletal structures are unremarkable.
IMPRESSION: No acute process in the chest.

## 2021-10-21 IMAGING — US US RENAL
1 series · 14 of 24 positions shown · non-contrast
Comparison: Prior ultrasound from 10/03/2018.

CLINICAL DATA: Initial evaluation for acute kidney injury.

EXAM:
RENAL / URINARY TRACT ULTRASOUND COMPLETE

[Series 1: us renal · 14 of 24 slices shown]
[im 1/24]
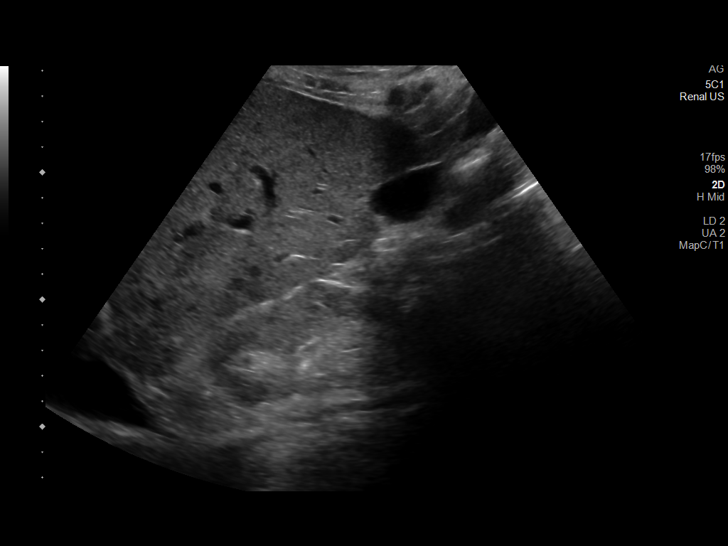
[im 3/24]
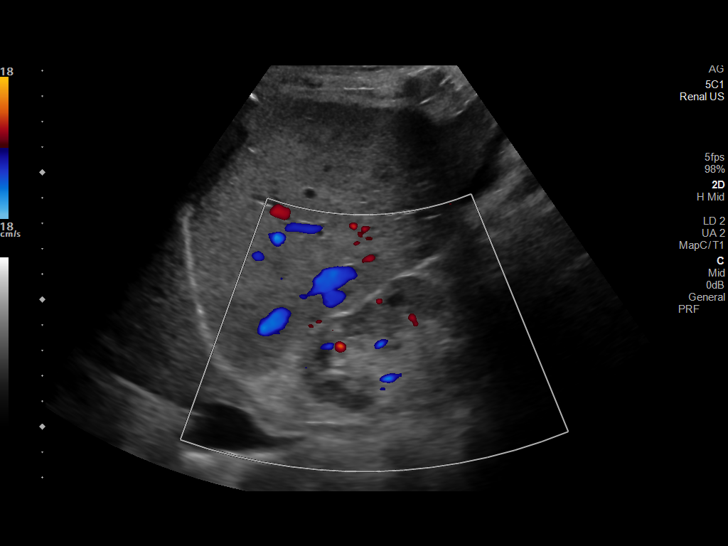
[im 5/24]
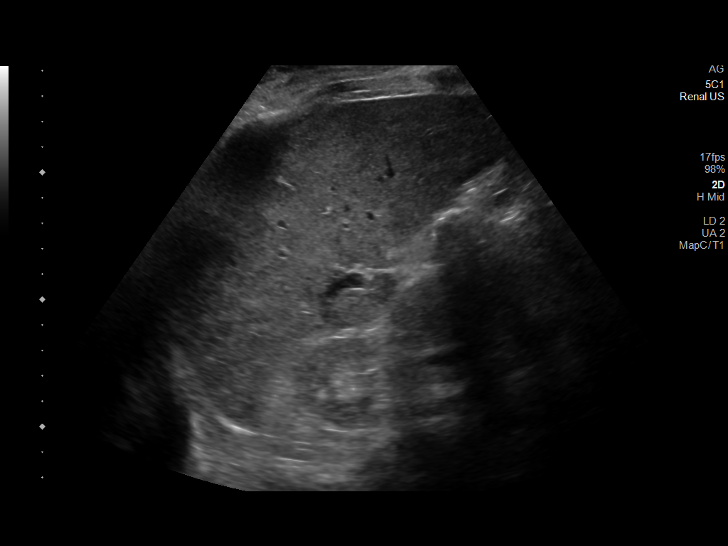
[im 7/24]
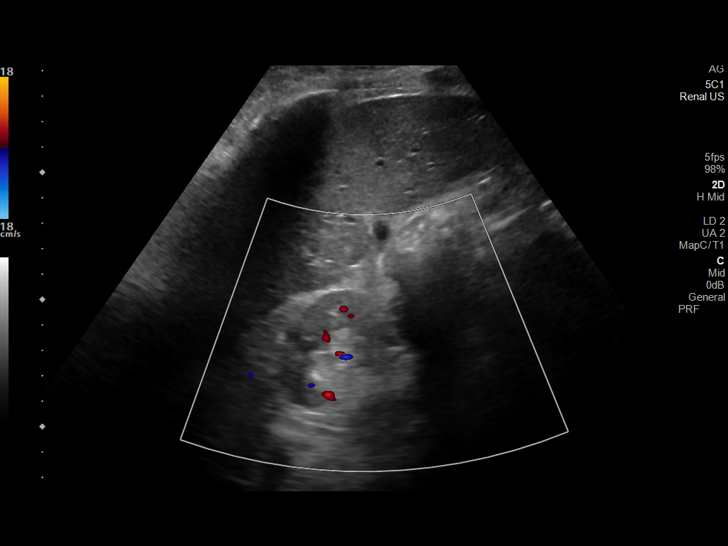
[im 8/24]
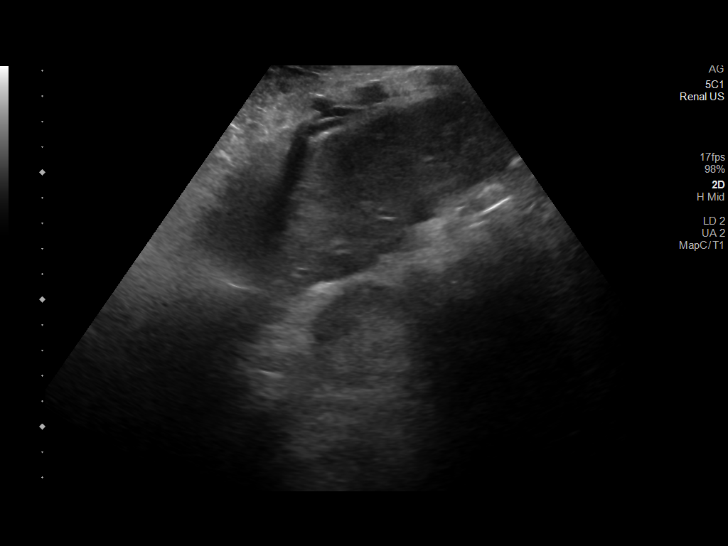
[im 10/24]
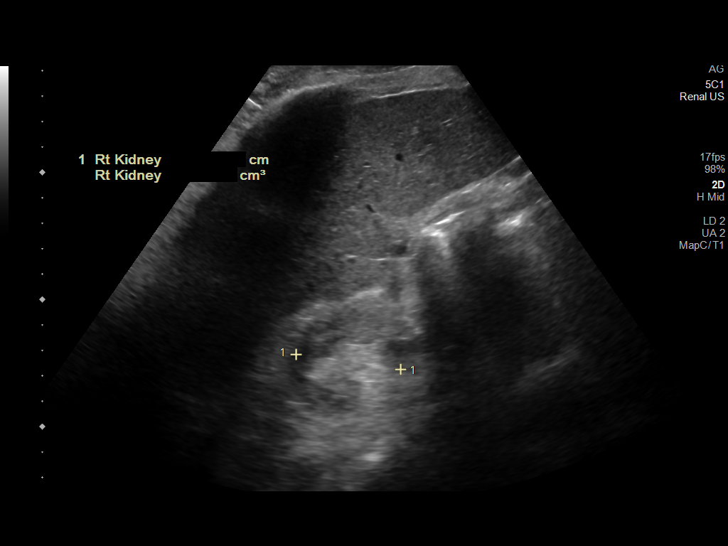
[im 12/24]
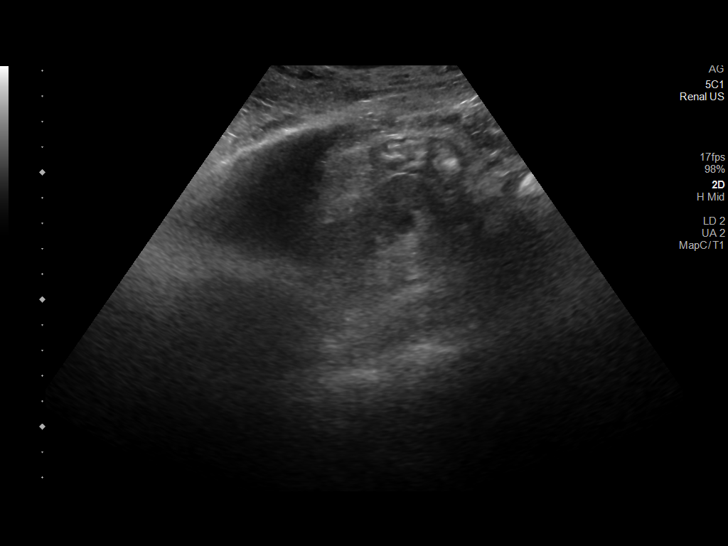
[im 13/24]
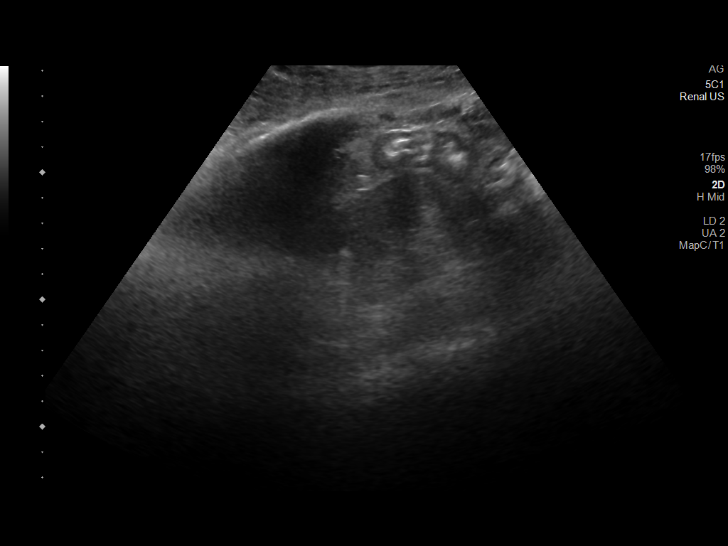
[im 15/24]
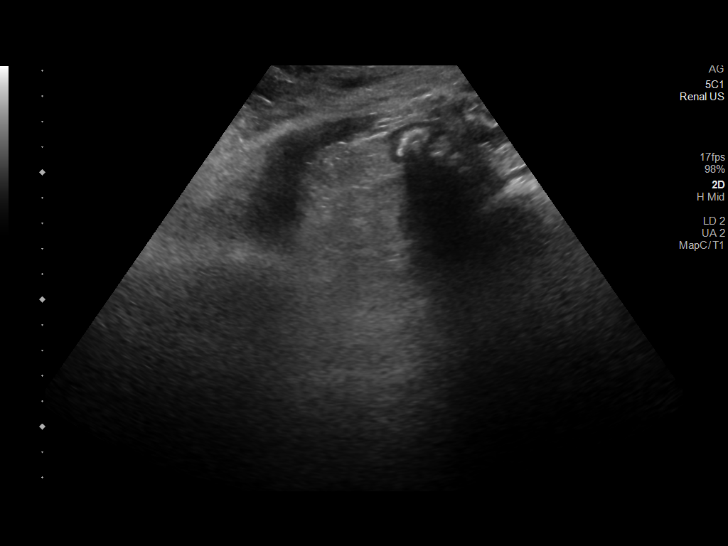
[im 17/24]
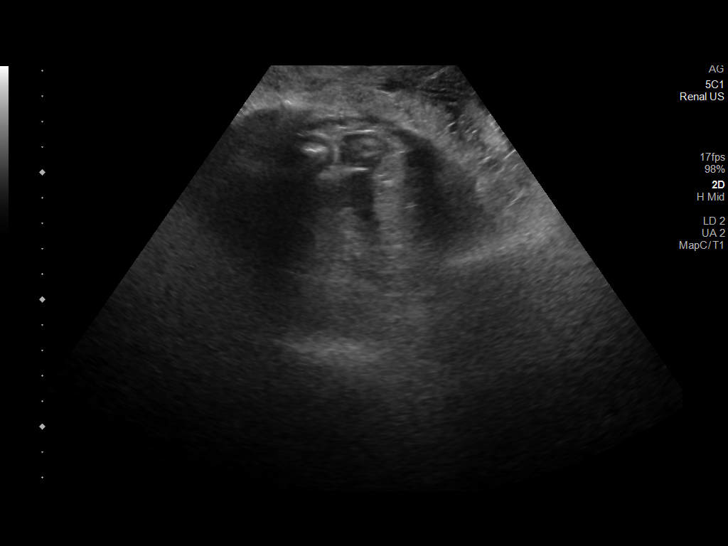
[im 19/24]
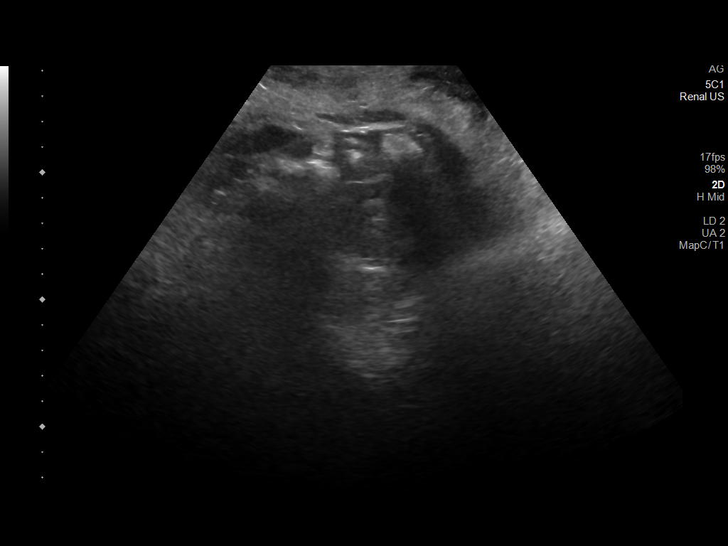
[im 20/24]
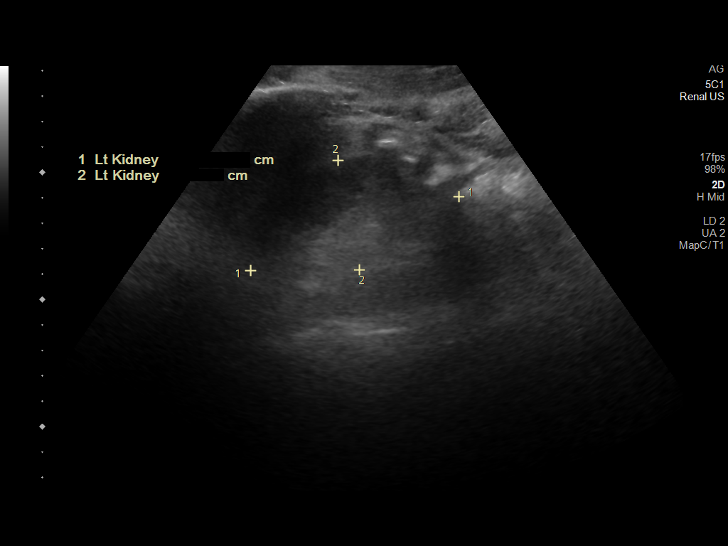
[im 22/24]
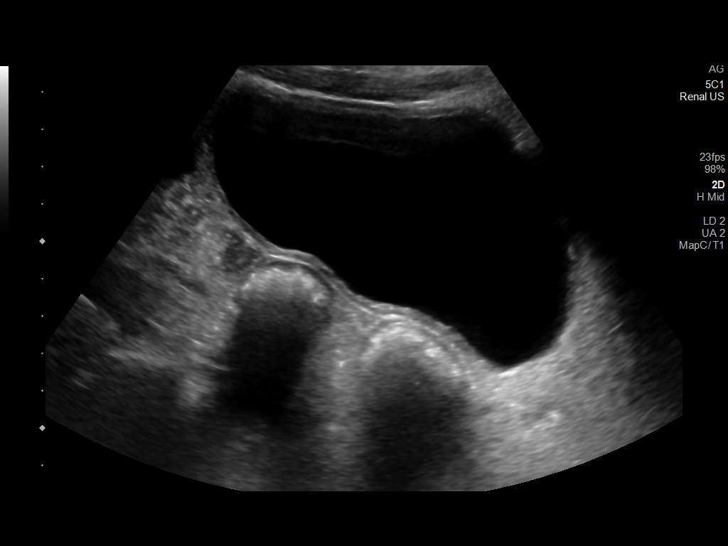
[im 24/24]
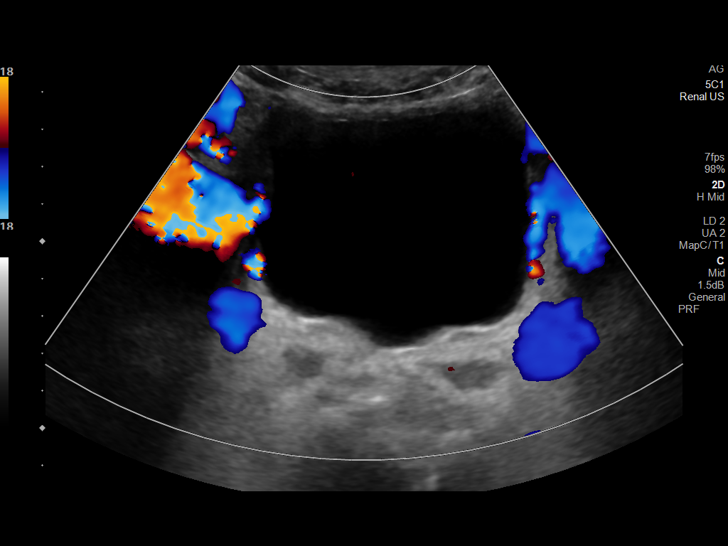

[14 of 24 positions shown; findings below may reference images not displayed]

FINDINGS: Right Kidney:

Renal measurements: 9.7 x 4.1 x 4.2 cm = volume: 85 mL. Increased
echogenicity within the renal parenchyma. No nephrolithiasis or
hydronephrosis. No focal renal mass.

Left Kidney:

Renal measurements: 8.7 x 4.4 x 4.1 cm = volume: 82 mL. Left kidney
somewhat difficult to visualize due to shadowing from overlying
bowel gas. Visualized portions demonstrate increased echogenicity
within the renal parenchyma. No visible nephrolithiasis or
hydronephrosis. No focal renal mass.

Bladder:

Appears normal for degree of bladder distention.

Other:

Incidental note made of a right pleural effusion.
IMPRESSION: 1. Increased echogenicity within the renal parenchyma, compatible
with medical renal disease.
2. No hydronephrosis or other acute abnormality.
3. Incidental right pleural effusion.

## 2021-10-21 IMAGING — CT CT HEAD W/O CM
4 series · 17 of 47 positions shown, 19 images · non-contrast
Comparison: Head CT 04/14/2020

CLINICAL DATA: Altered mental status.

EXAM:
CT HEAD WITHOUT CONTRAST
TECHNIQUE: Contiguous axial images were obtained from the base of the skull
through the vertex without intravenous contrast.

[Series 2: head wo · axial · 0.44mm/px · z∈[+157,+277]mm · 7 of 34 slices shown, 9 images]
[im 5/34  brain]
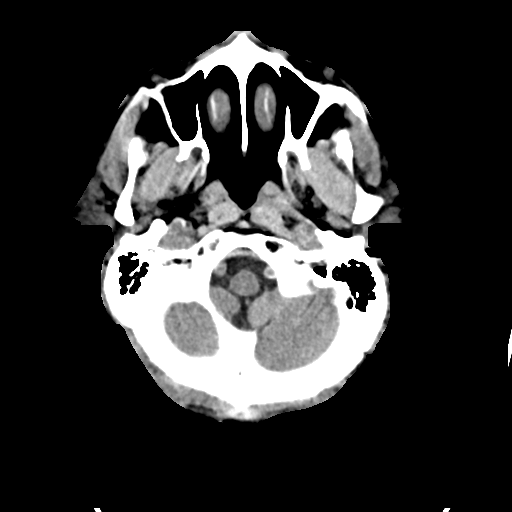
[im 5/34  bone]
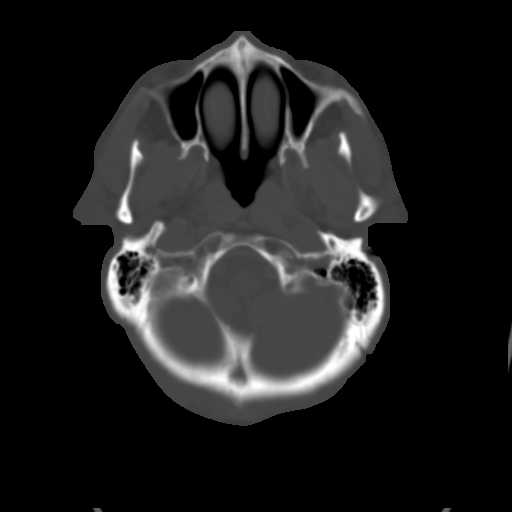
[im 9/34  brain]
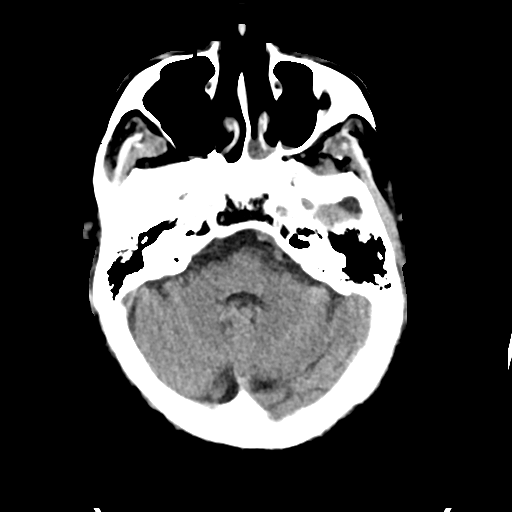
[im 13/34  brain]
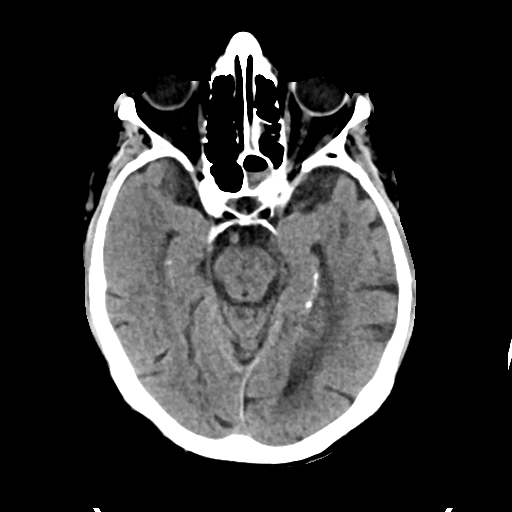
[im 17/34  brain]
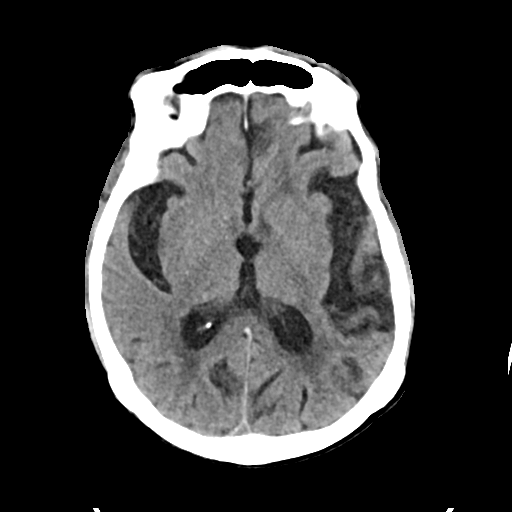
[im 21/34  brain]
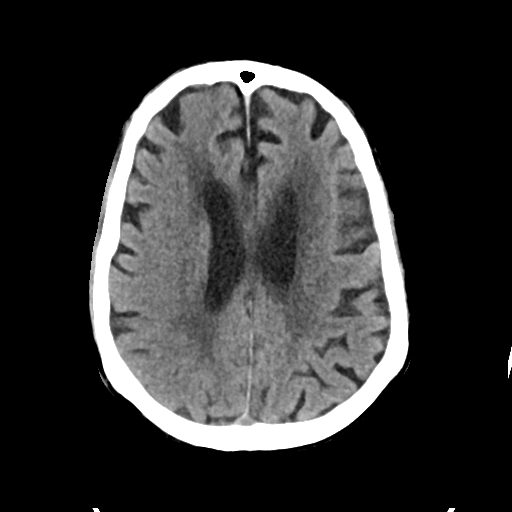
[im 21/34  bone]
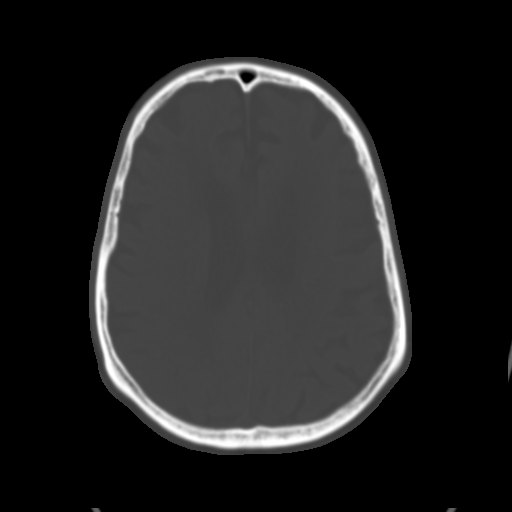
[im 25/34  brain]
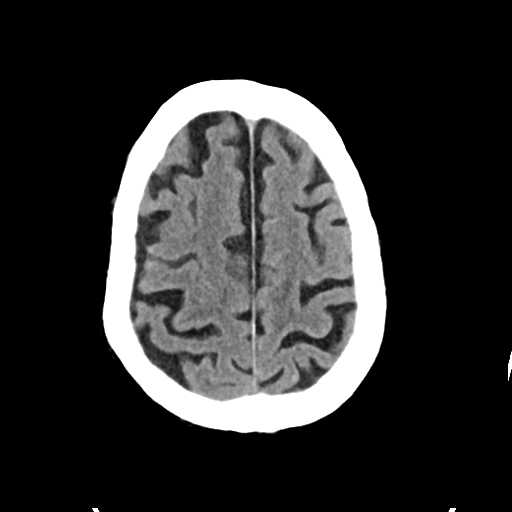
[im 29/34  brain]
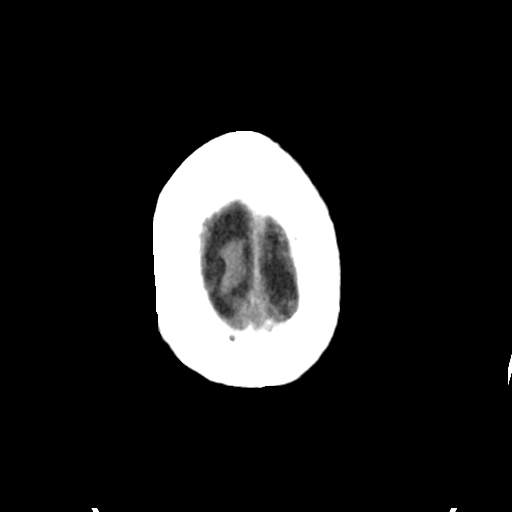

[Series 3: head bone · axial · 0.44mm/px · z∈[+153,+211]mm · 4 of 84 slices shown]
[im 9/84  bone]
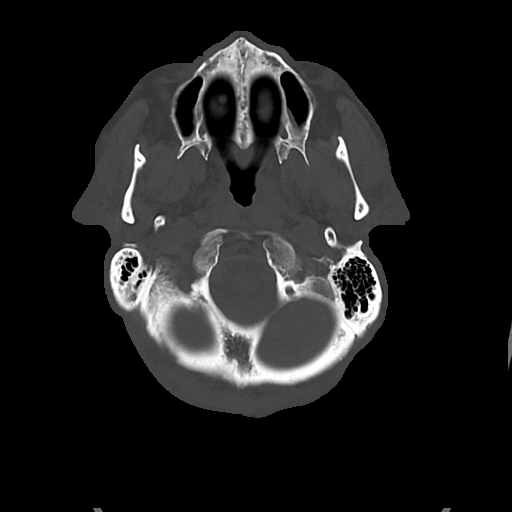
[im 17/84  bone]
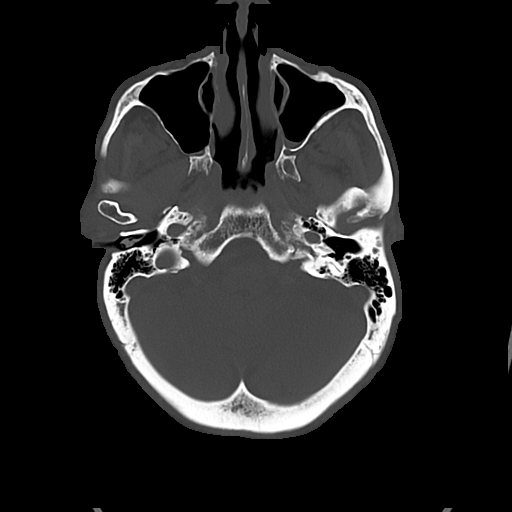
[im 25/84  bone]
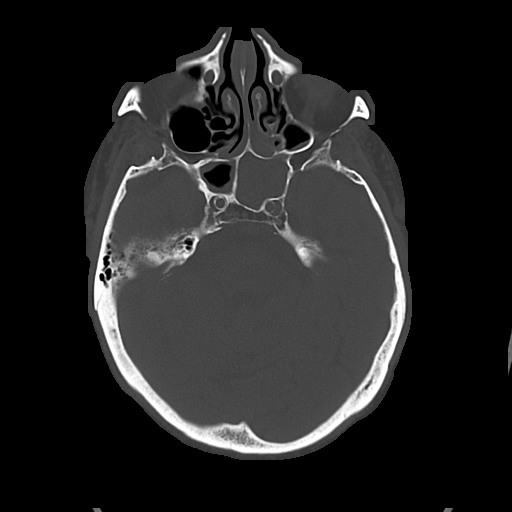
[im 38/84  bone]
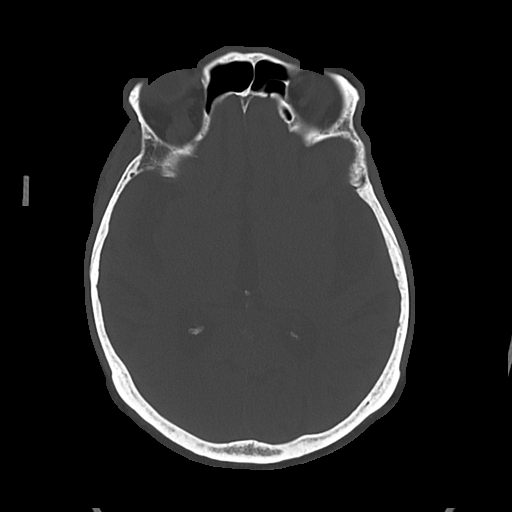

[Series 4: cor soft · coronal · 0.34mm/px · 3 of 68 slices shown]
[im 23/68  brain]
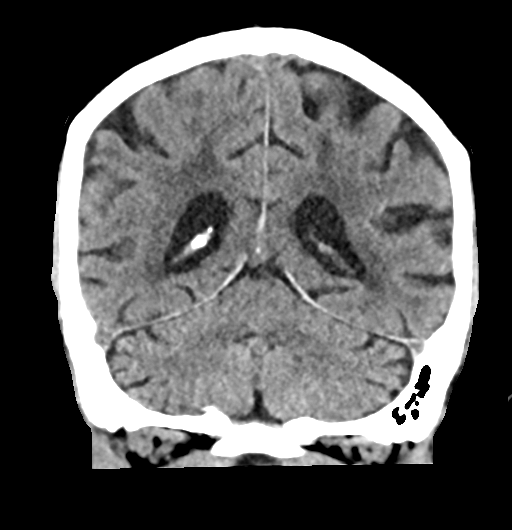
[im 30/68  brain]
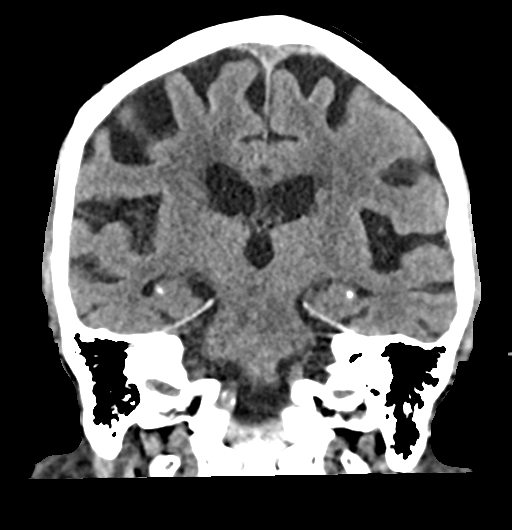
[im 38/68  brain]
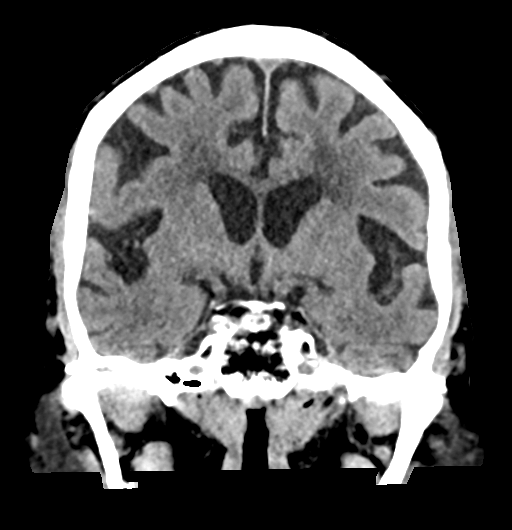

[Series 5: sag soft · sagittal · 0.35mm/px · 3 of 57 slices shown]
[im 19/57  brain]
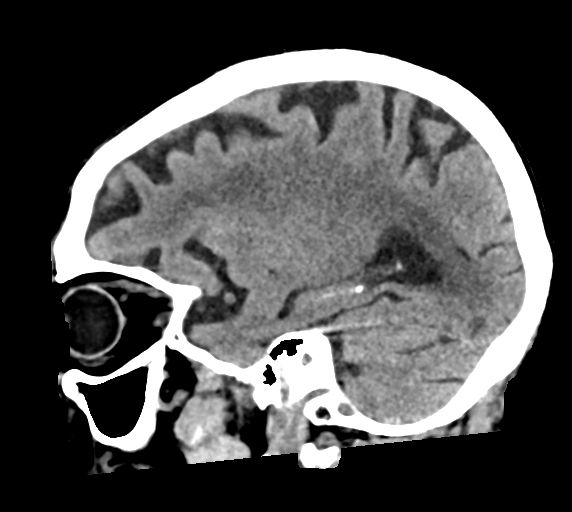
[im 29/57  brain]
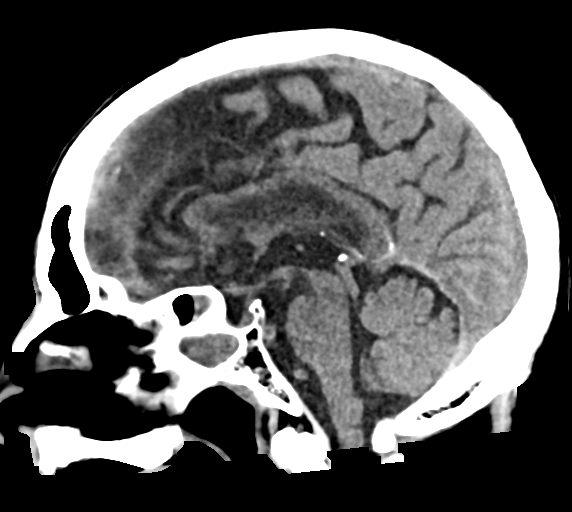
[im 38/57  brain]
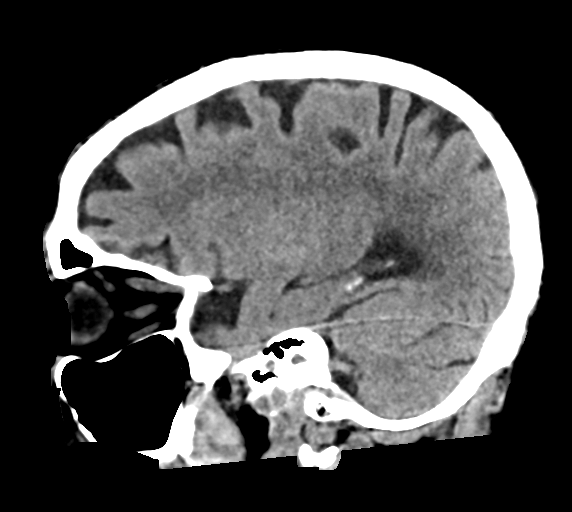

[17 of 47 positions shown; findings below may reference images not displayed]

FINDINGS: Brain: Again seen generalized atrophy and moderate chronic small
vessel ischemia. Small chronic lacunar infarct in the subcortical
white matter of the posterior right frontal lobe. No intracranial
hemorrhage, mass effect, or midline shift. No hydrocephalus. The
basilar cisterns are patent. No evidence of territorial infarct or
acute ischemia. No extra-axial or intracranial fluid collection.

Vascular: Atherosclerosis of skullbase vasculature without
hyperdense vessel or abnormal calcification.

Skull: No fracture or focal lesion.

Sinuses/Orbits: There is mucosal thickening involving ethmoid air
cells, maxillary sinuses, and sphenoid sinuses. Fluid levels
involving the left greater than right sphenoid sinus. Mastoid air
cells are clear.

Other: None.
IMPRESSION: 1. No acute intracranial abnormality.
2. Stable atrophy and chronic small vessel ischemia.
3. Paranasal sinus disease with fluid levels in the sphenoid sinus,
may represent acute sinusitis.

## 2021-10-25 IMAGING — CT CT HEAD W/O CM
4 series · 15 of 47 positions shown, 17 images · non-contrast
Comparison: CT 08/14/2020

CLINICAL DATA: Altered mental status, found on floor

EXAM:
CT HEAD WITHOUT CONTRAST
TECHNIQUE: Contiguous axial images were obtained from the base of the skull
through the vertex without intravenous contrast.

[Series 3: head wo · axial · 0.48mm/px · z∈[-136,-12]mm · 7 of 35 slices shown, 9 images]
[im 5/35  brain]
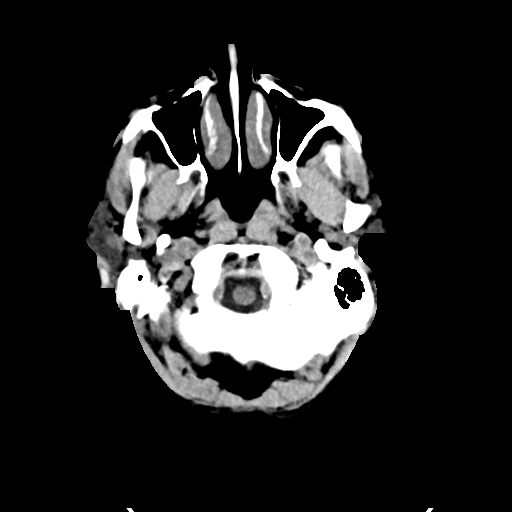
[im 5/35  bone]
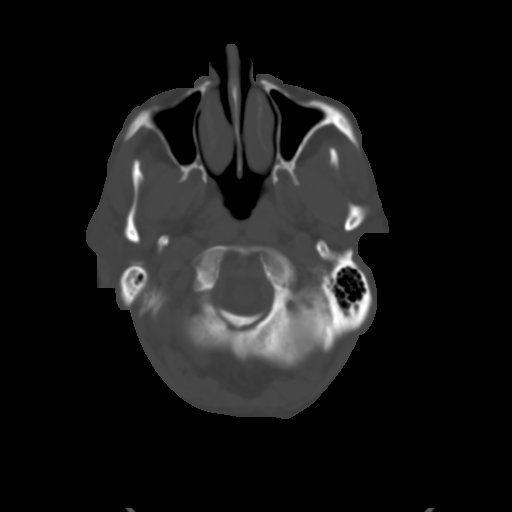
[im 9/35  brain]
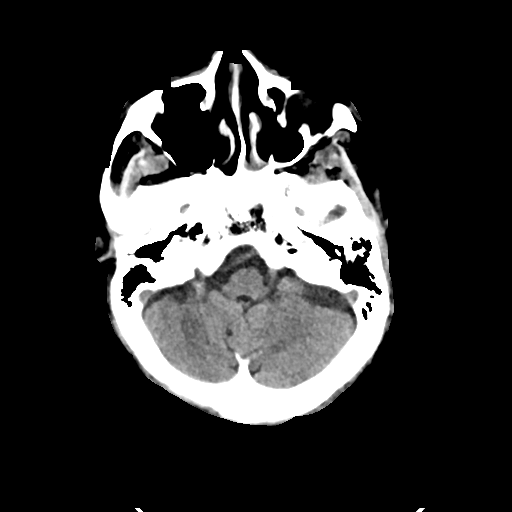
[im 13/35  brain]
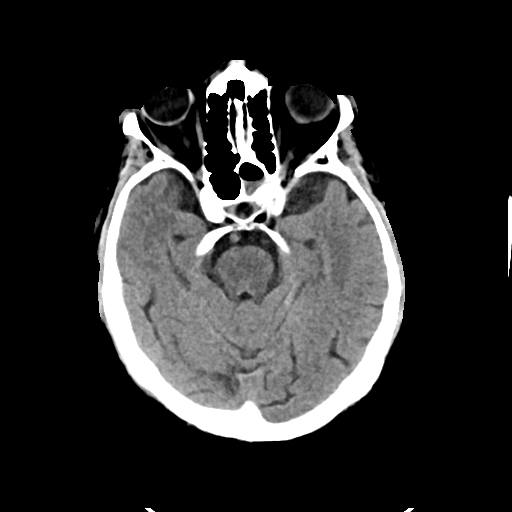
[im 18/35  brain]
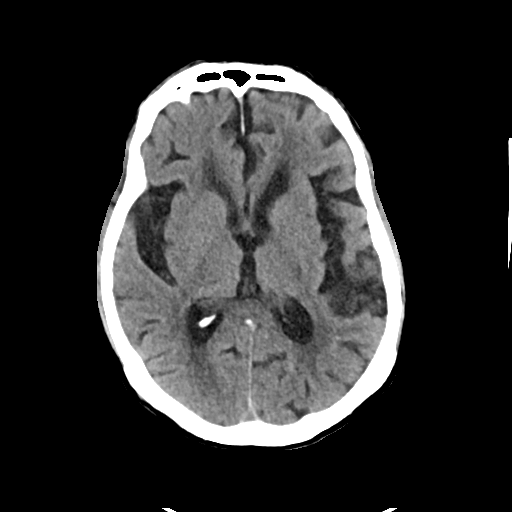
[im 22/35  brain]
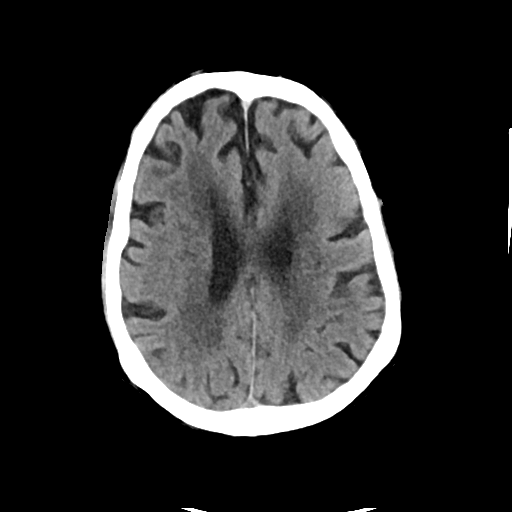
[im 22/35  bone]
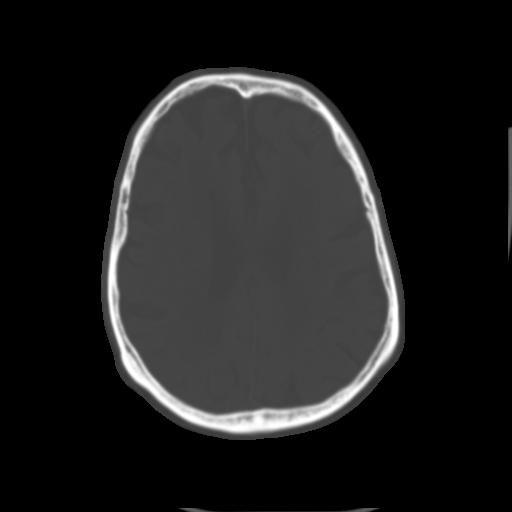
[im 26/35  brain]
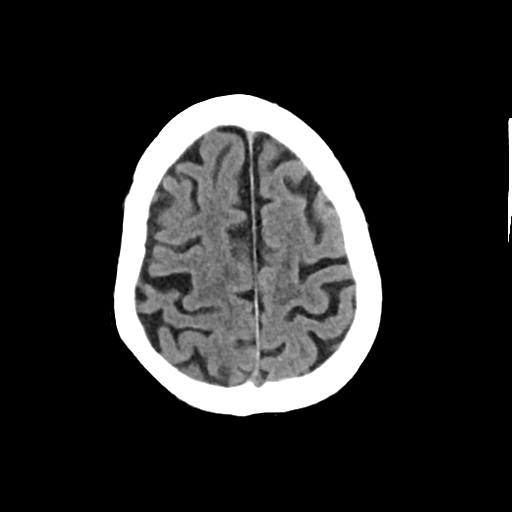
[im 30/35  brain]
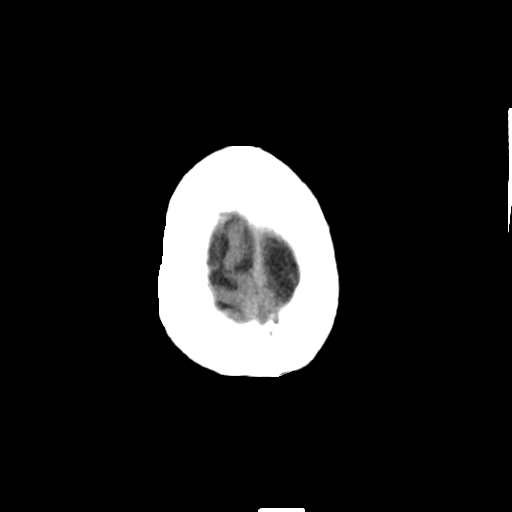

[Series 4: head bone · axial · 0.48mm/px · z∈[-140,-122]mm · 2 of 86 slices shown]
[im 9/86  bone]
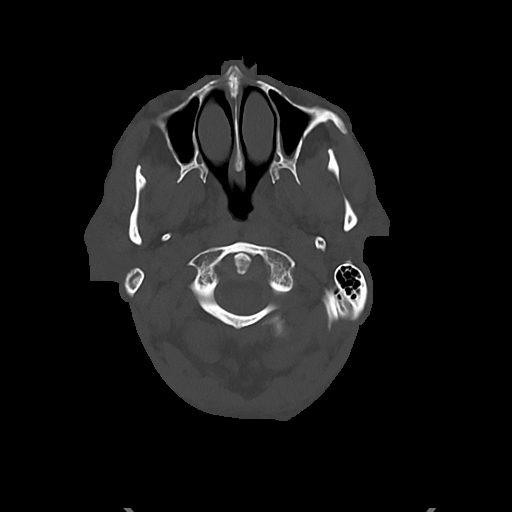
[im 18/86  bone]
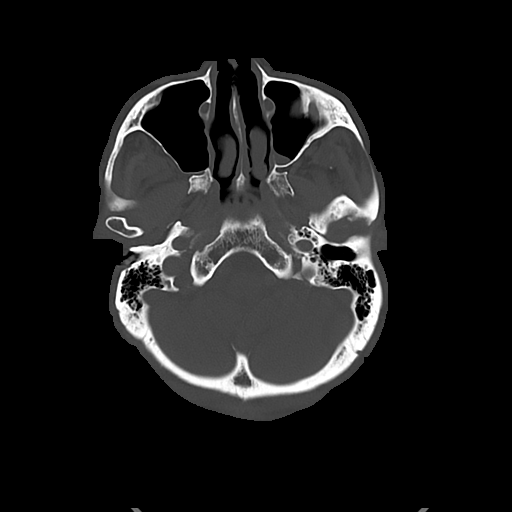

[Series 5: cor soft · coronal · 0.33mm/px · 3 of 84 slices shown]
[im 28/84  brain]
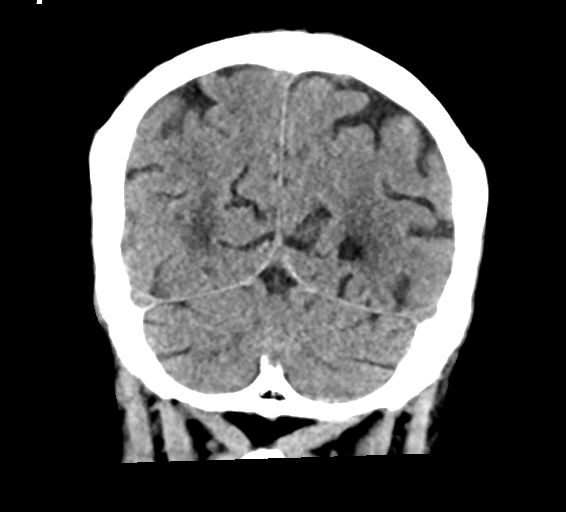
[im 37/84  brain]
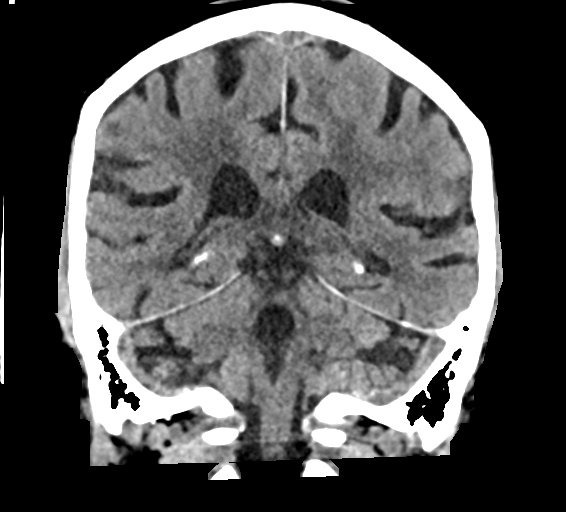
[im 47/84  brain]
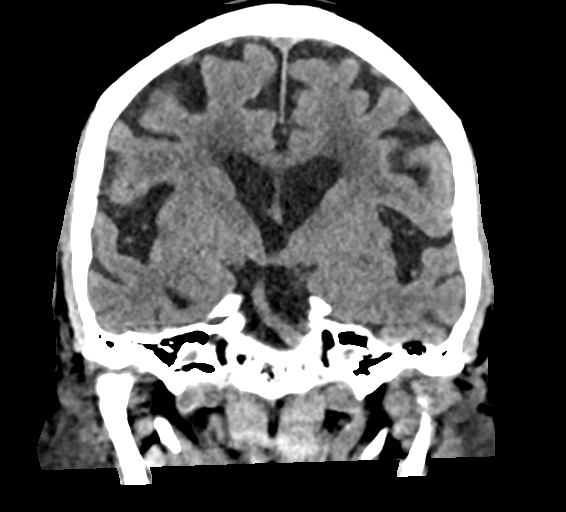

[Series 6: sag soft · sagittal · 0.33mm/px · 3 of 62 slices shown]
[im 21/62  brain]
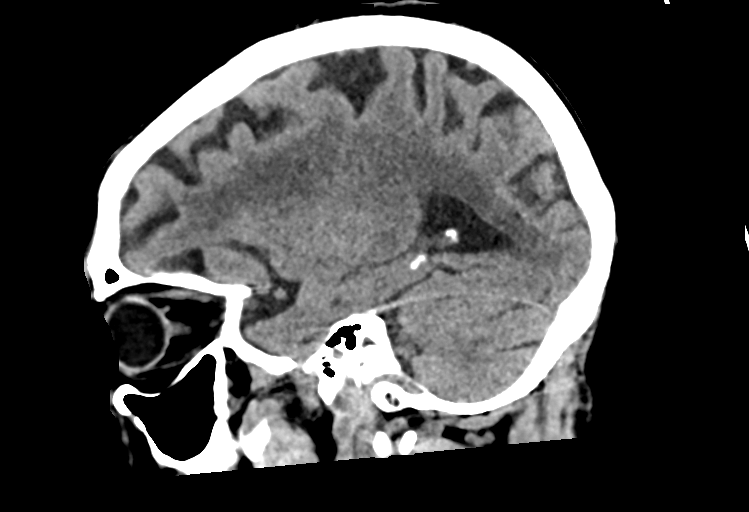
[im 31/62  brain]
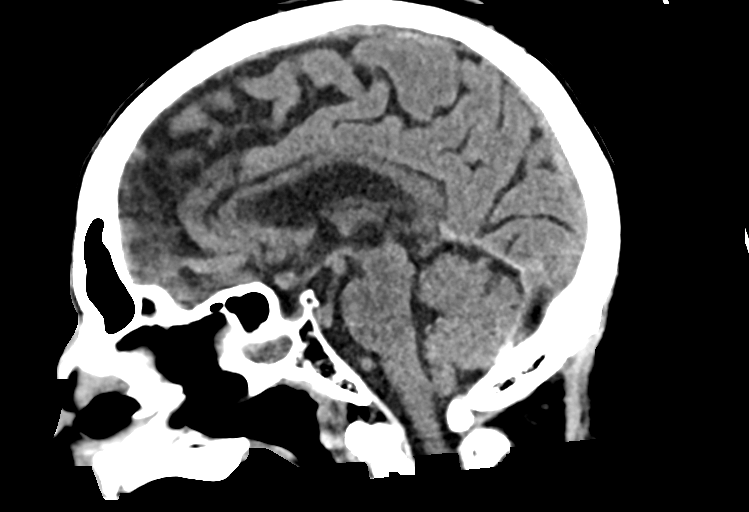
[im 41/62  brain]
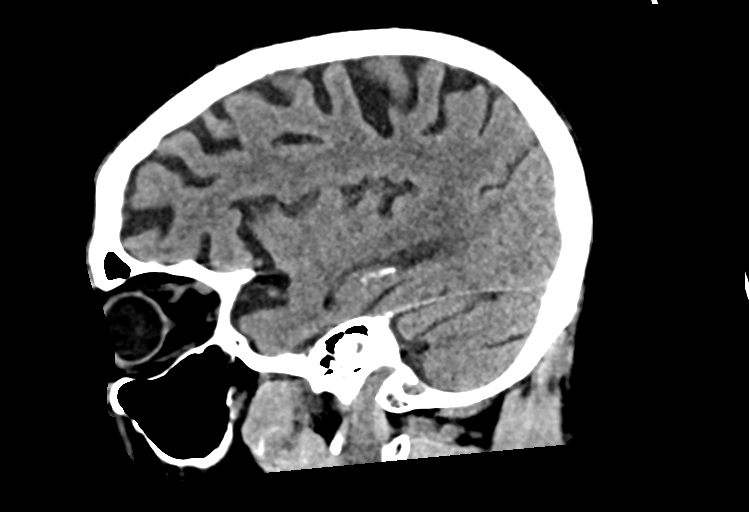

[15 of 47 positions shown; findings below may reference images not displayed]

FINDINGS: Brain: Few stable hypoattenuating foci in the subcortical white
matter of the bilateral frontal lobes, likely to reflect areas of
prior lacunar type infarcts. No evidence of acute infarction,
hemorrhage, hydrocephalus, extra-axial collection, visible mass
lesion or mass effect. Prominence of the ventricles, cisterns and
sulci compatible with moderate frontal predominant parenchymal
volume loss. Patchy areas of white matter hypoattenuation are most
compatible with chronic microvascular angiopathy.

Vascular: Atherosclerotic calcification of the carotid siphons and
intradural vertebral arteries. No hyperdense vessel.

Skull: No calvarial fracture or suspicious osseous lesion. No scalp
swelling or hematoma.

Sinuses/Orbits: Mild mural thickening in the left maxillary and
ethmoid sinuses. Subtotal opacification left sphenoid sinus with
small bubbly lucency/air-fluid level. Included orbital structures
are unremarkable.

Other: None.
IMPRESSION: 1. No acute intracranial abnormality.
2. Stable moderate frontal predominant parenchymal volume loss and
chronic microvascular angiopathy.
3. Stable remote lacunar type infarcts in the subcortical white
matter of the bilateral frontal lobes.
4. Layering air-fluid level in the left sphenoid sinus with more
mild mural disease in maxillary and ethmoid sinuses, could reflect
an acute on chronic sinusitis. Correlate with clinical symptoms.

## 2021-12-25 IMAGING — DX DG CHEST 1V PORT
1 series · 1 of 1 positions shown · non-contrast
Comparison: August 19, 2020

CLINICAL DATA: Shortness of breath

EXAM:
PORTABLE CHEST 1 VIEW

[chest]
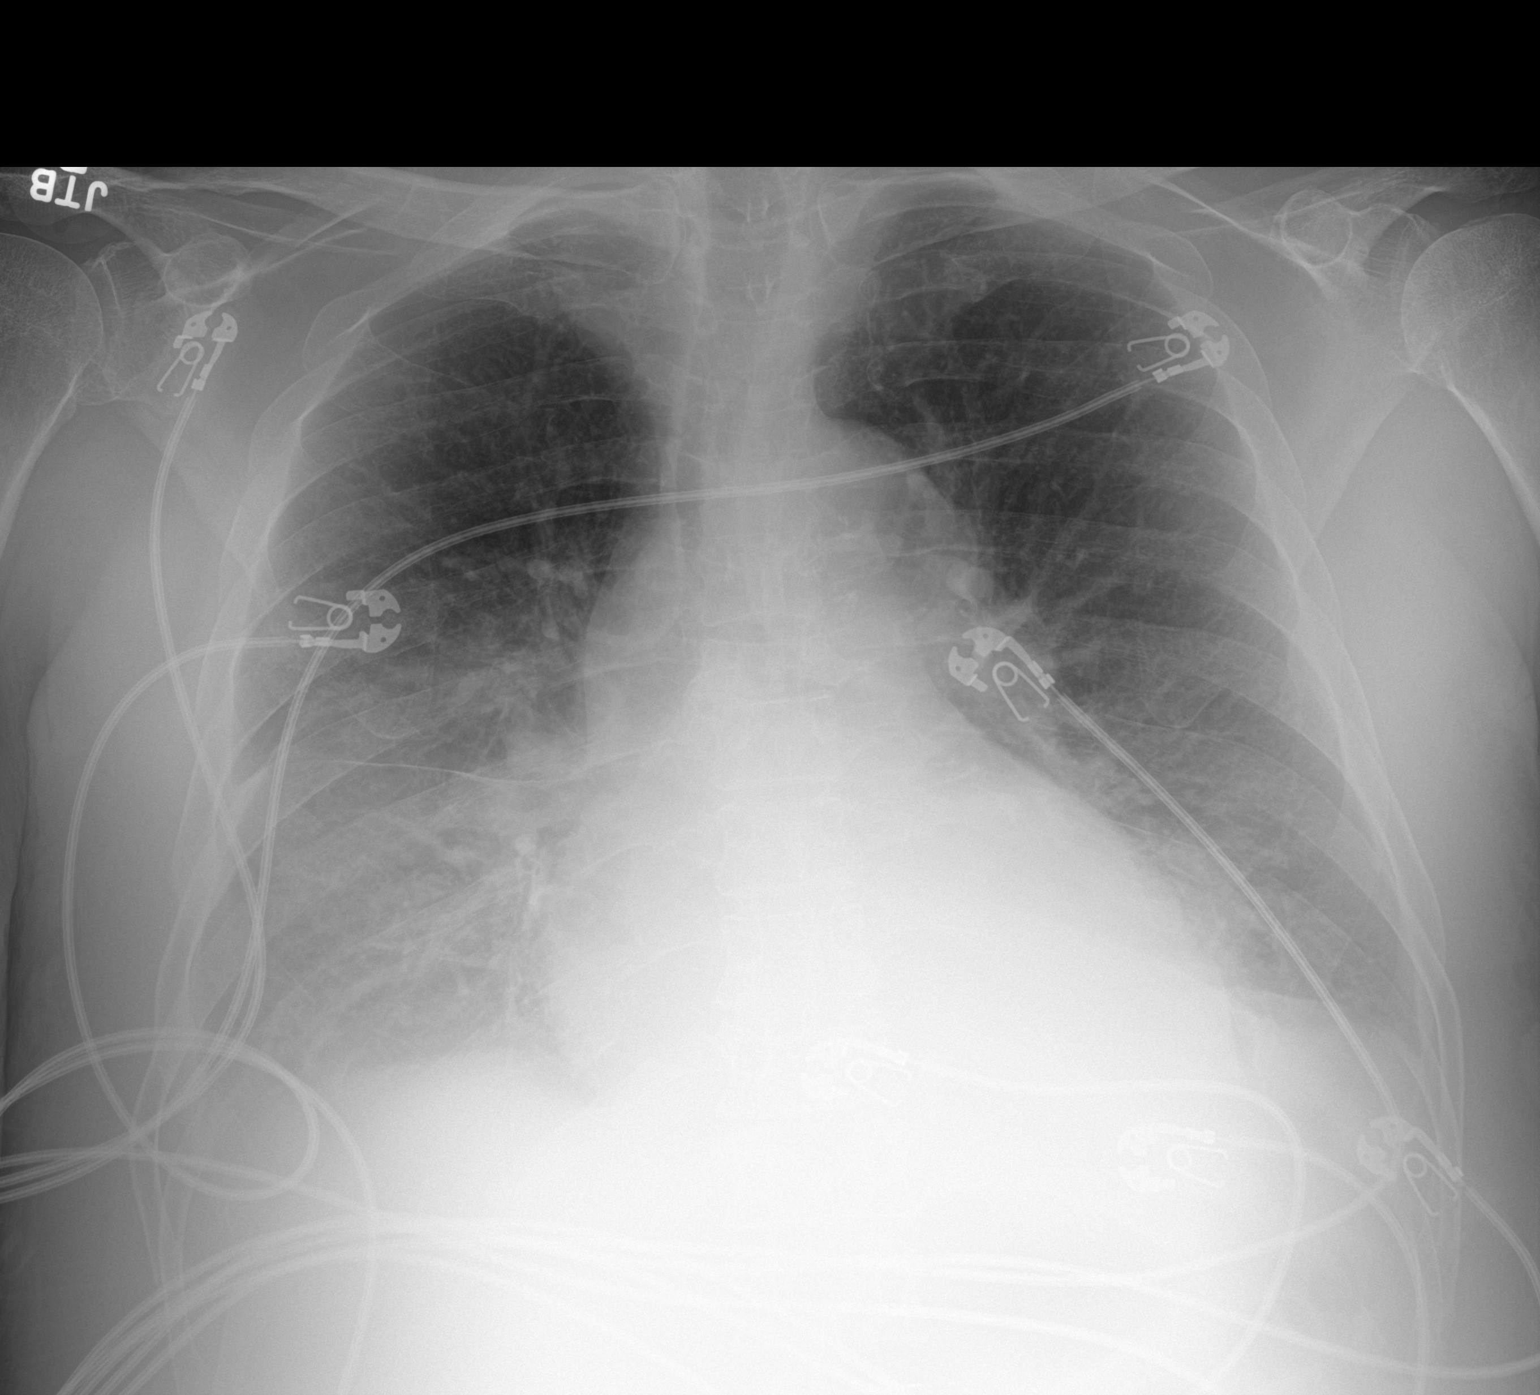

[1 of 1 positions shown; findings below may reference images not displayed]

FINDINGS: There is cardiomegaly with pulmonary vascularity normal. There is
ill-defined airspace opacity in the lower lung regions bilaterally.
No consolidation. No appreciable adenopathy. No bone lesions.
IMPRESSION: Ill-defined airspace opacity in the lung bases, slightly more
notable on the left than on the right. Suspect bibasilar pneumonia.
Question atypical organism pneumonia. Advise [id number] status check.

Cardiomegaly with pulmonary vascular within normal limits. No
adenopathy demonstrable.

## 2021-12-25 IMAGING — CT CT HEAD W/O CM
3 series · 16 of 47 positions shown, 19 images · non-contrast
Comparison: 08/18/2020 head CT

CLINICAL DATA: 74-year-old male with head trauma.

EXAM:
CT HEAD WITHOUT CONTRAST
TECHNIQUE: Contiguous axial images were obtained from the base of the skull
through the vertex without intravenous contrast.

[Series 3: head 5.0 h30s · axial · 0.49mm/px · z∈[-137,+3]mm · 10 of 34 slices shown, 13 images]
[im 3/34  brain]
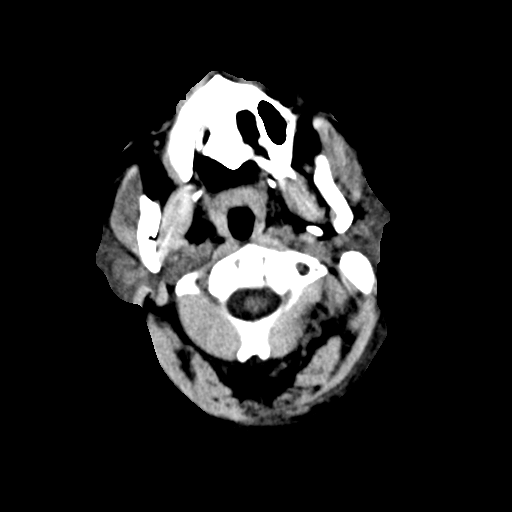
[im 3/34  bone]
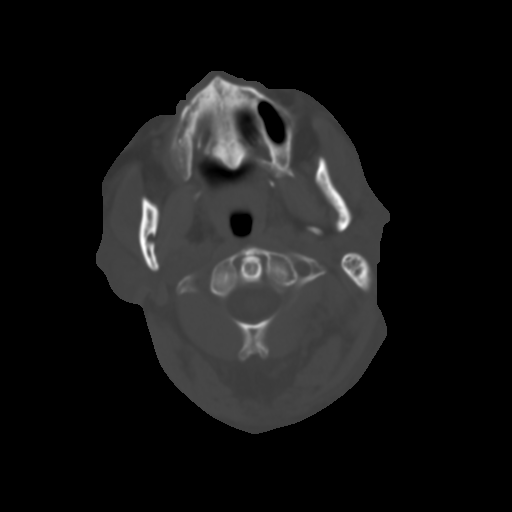
[im 6/34  brain]
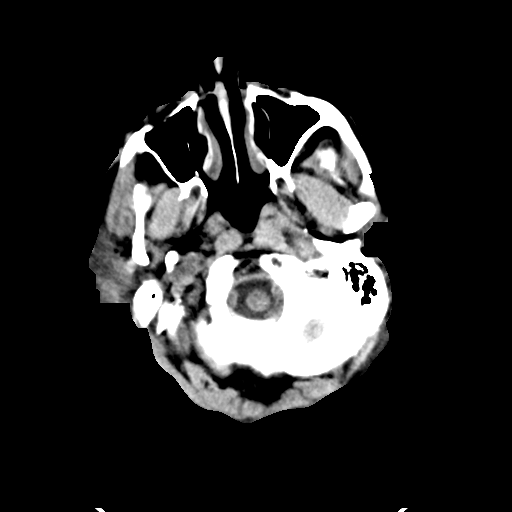
[im 10/34  brain]
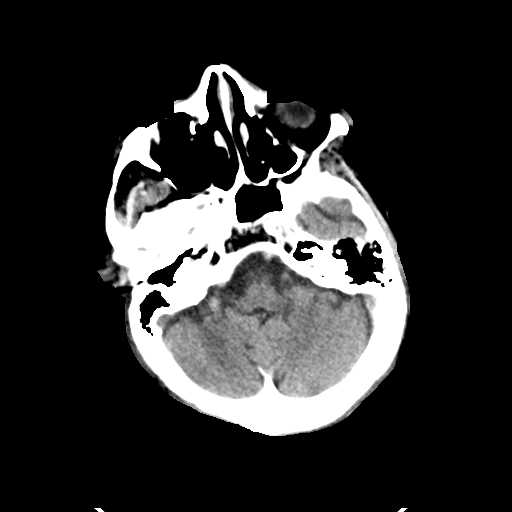
[im 12/34  brain]
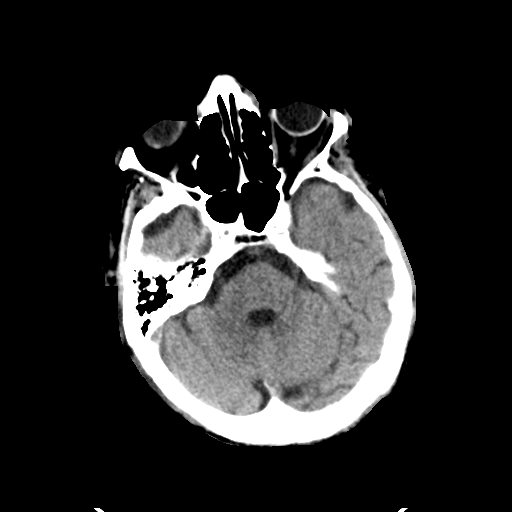
[im 15/34  brain]
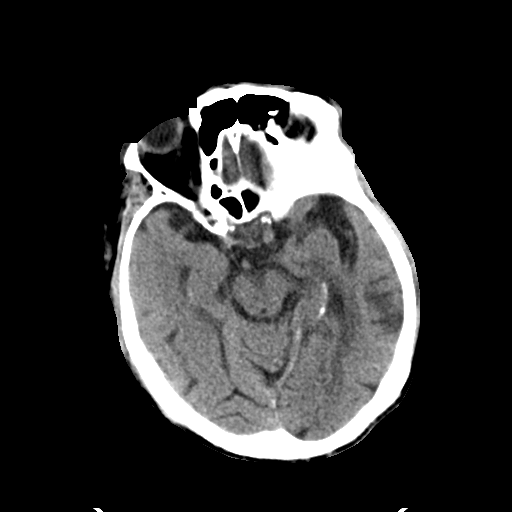
[im 15/34  bone]
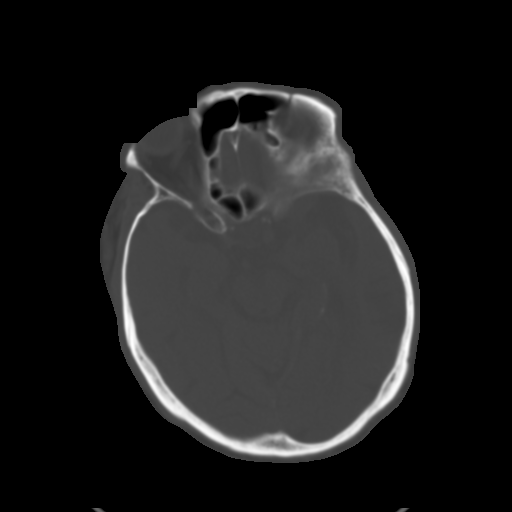
[im 19/34  brain]
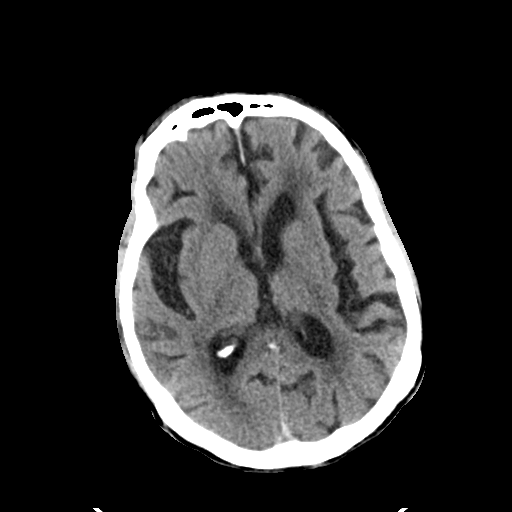
[im 22/34  brain]
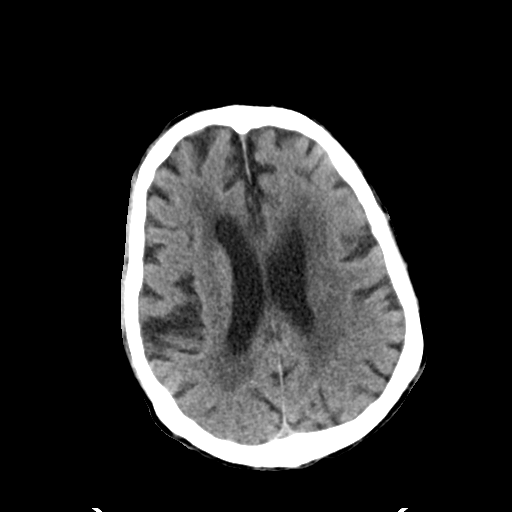
[im 26/34  brain]
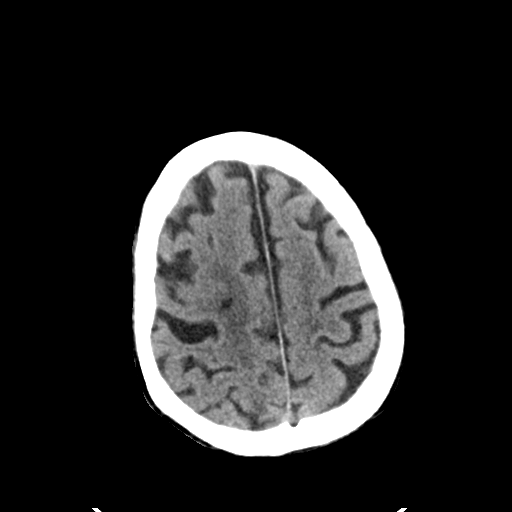
[im 28/34  brain]
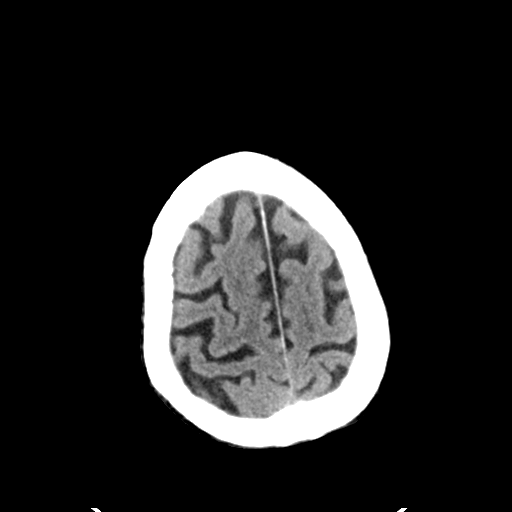
[im 28/34  bone]
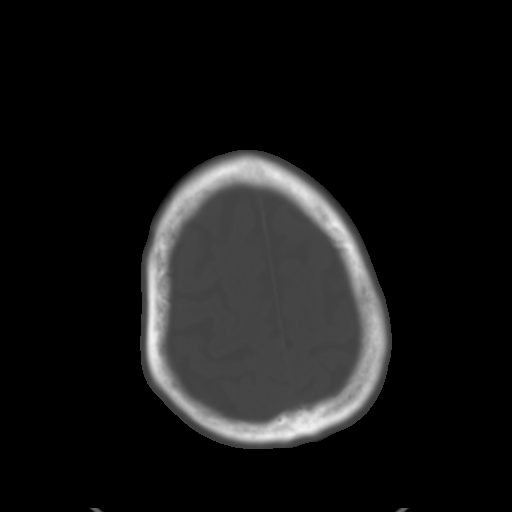
[im 31/34  brain]
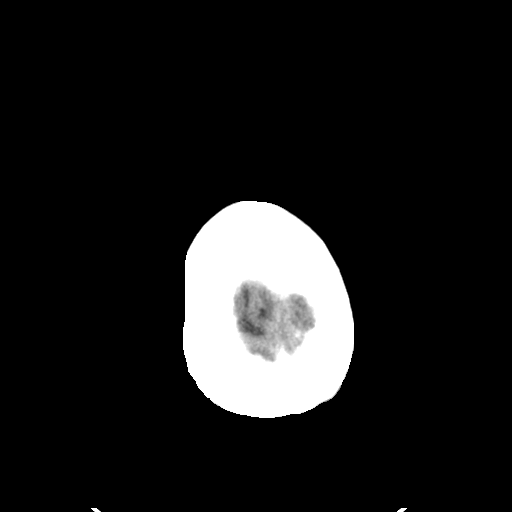

[Series 5: head 3.0 mpr cor · coronal · 0.38mm/px · 3 of 75 slices shown]
[im 25/75  brain]
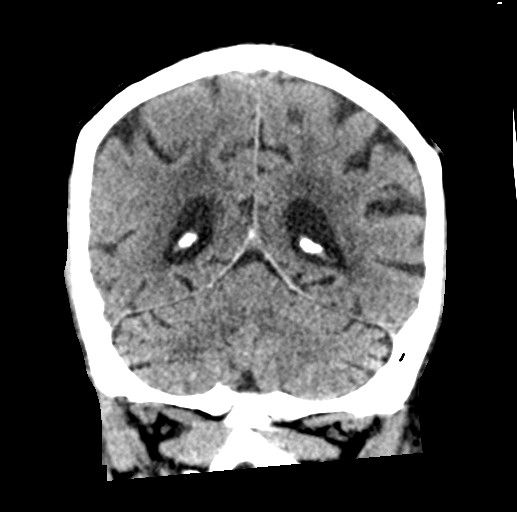
[im 33/75  brain]
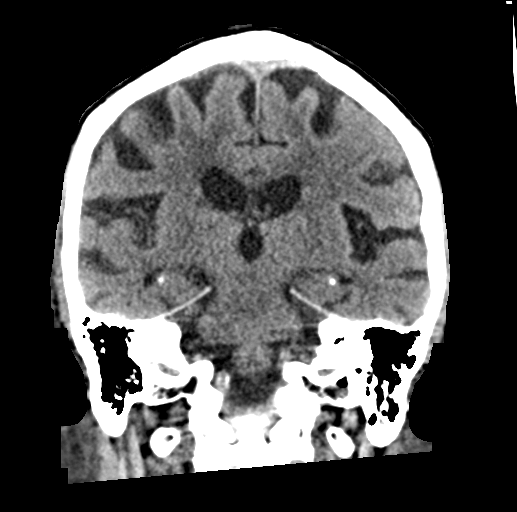
[im 42/75  brain]
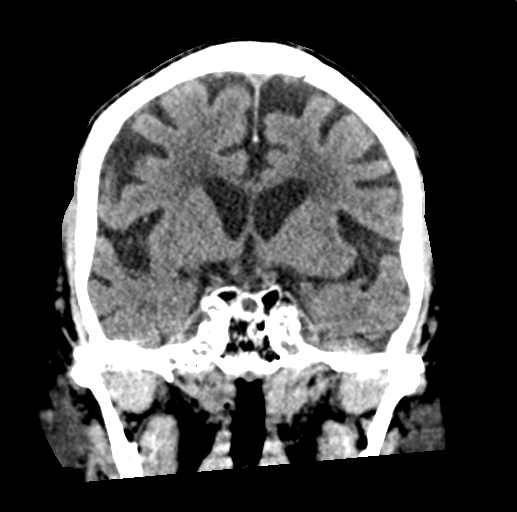

[Series 6: head 3.0 mpr sag · sagittal · 0.36mm/px · 3 of 66 slices shown]
[im 22/66  brain]
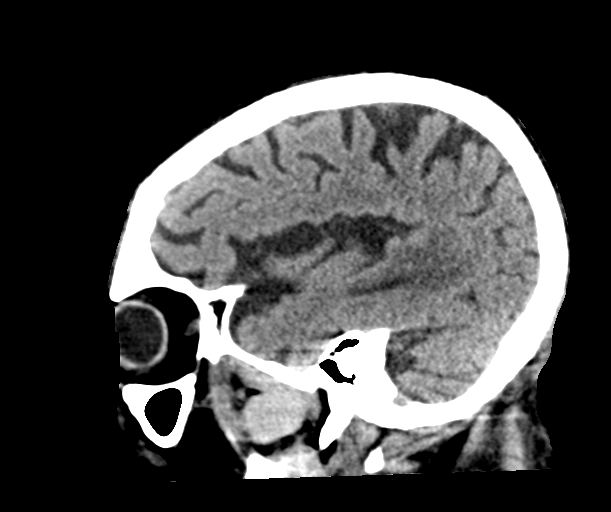
[im 33/66  brain]
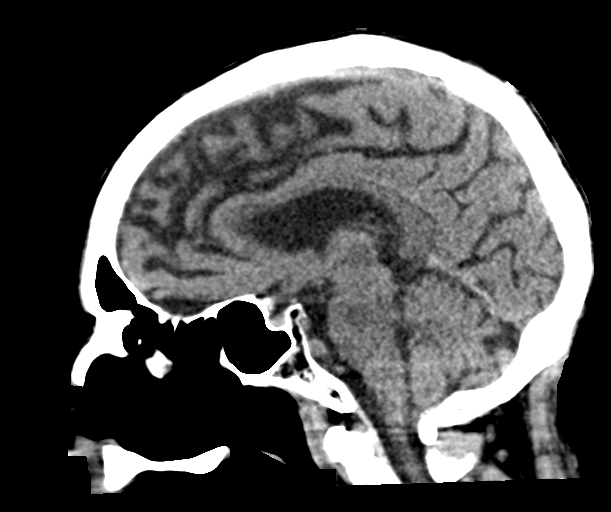
[im 44/66  brain]
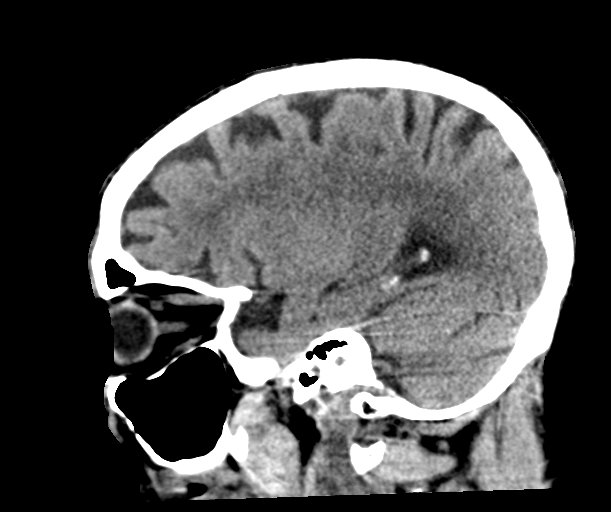

[16 of 47 positions shown; findings below may reference images not displayed]

FINDINGS: Brain: No evidence of acute infarction, hemorrhage, hydrocephalus,
extra-axial collection or mass lesion/mass effect.

Atrophy and chronic small-vessel white matter ischemic changes are
again noted.

Vascular: Heavy carotid and vertebral atherosclerotic calcifications
are noted.

Skull: Normal. Negative for fracture or focal lesion.

Sinuses/Orbits: No acute finding.

Other: None.
IMPRESSION: 1. No evidence of acute intracranial abnormality.
2. Atrophy and chronic small-vessel white matter ischemic changes.

## 2021-12-25 IMAGING — CT CT L SPINE W/O CM
3 of 4 series · 12 of 33 positions shown, 14 images · non-contrast
Comparison: 07/06/2020

CLINICAL DATA: Abdominal pain.  Back pain.

EXAM:
CT CHEST, ABDOMEN AND PELVIS WITHOUT CONTRAST
CT LUMBAR SPINE WITHOUT CONTRAST
TECHNIQUE: Multidetector CT imaging of the chest, abdomen and pelvis was
performed following the standard protocol without IV contrast.
Multiplanar CT images of the lumbar spine were reconstructed from
contemporary CT of the Chest, Abdomen, and Pelvis

[Series 3: cap wo 2.0 i49f 2 · axial · 0.31mm/px · z∈[-634,-492]mm · 4 of 107 slices shown, 5 images]
[im 18/107  soft-tissue]
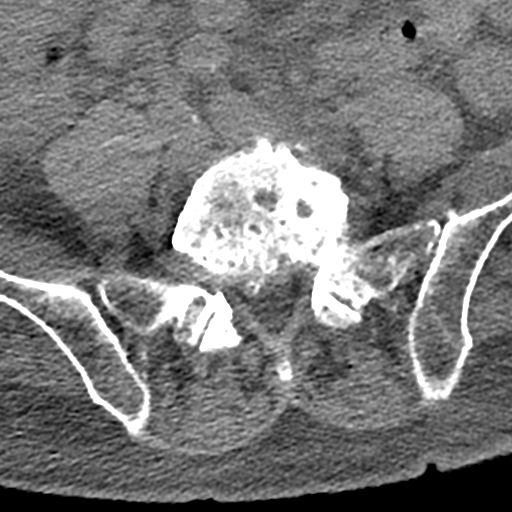
[im 18/107  bone]
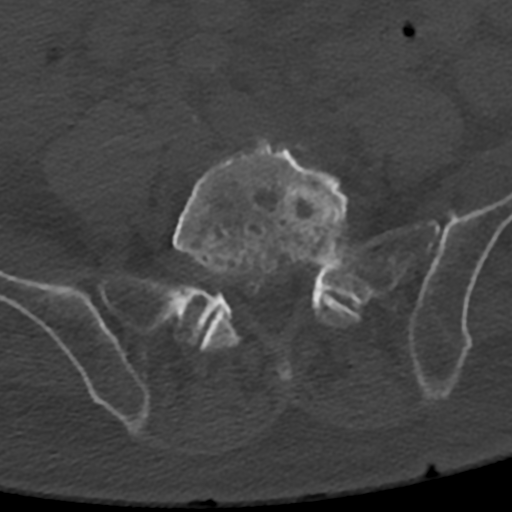
[im 36/107  bone]
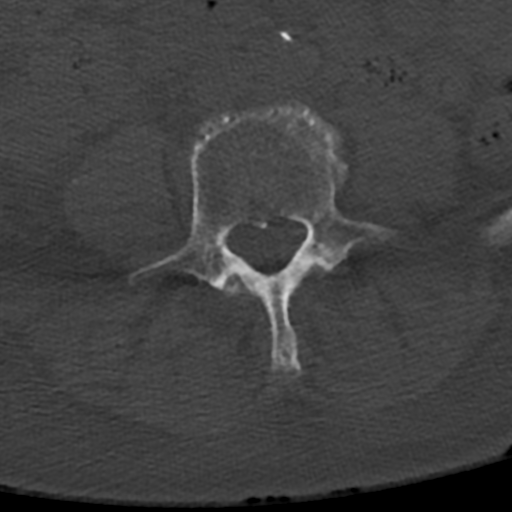
[im 71/107  bone]
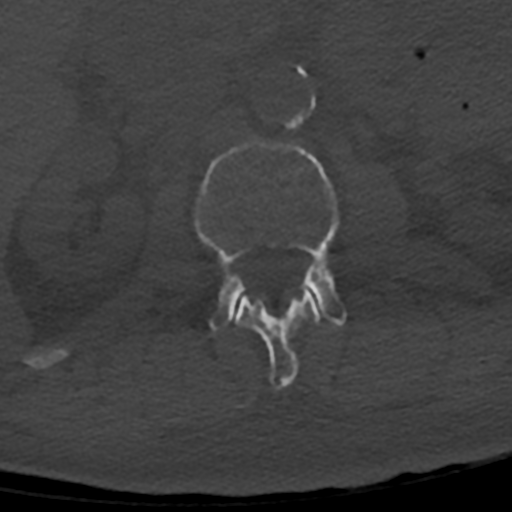
[im 89/107  bone]
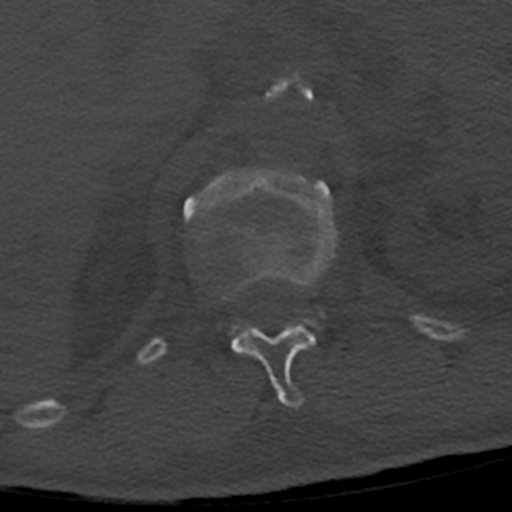

[Series 5: l-spine sag · sagittal · 0.31mm/px · 5 of 81 slices shown, 6 images]
[im 27/81  bone]
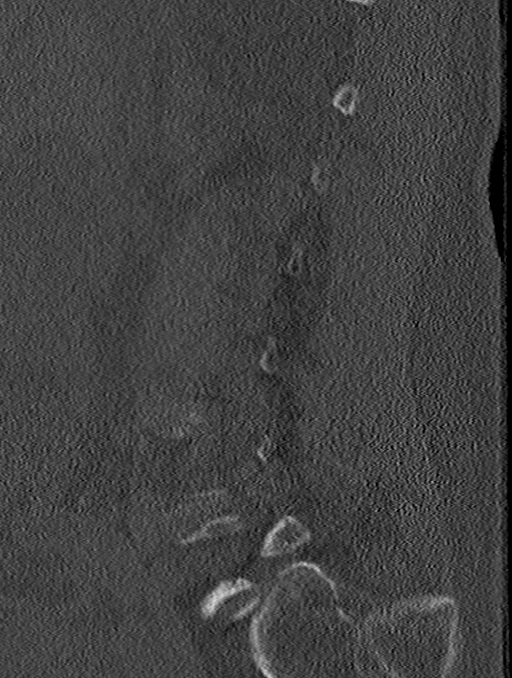
[im 34/81  bone]
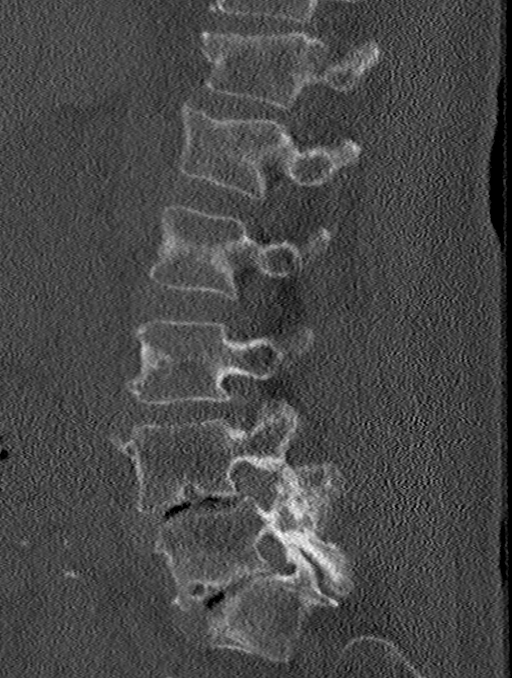
[im 41/81  soft-tissue]
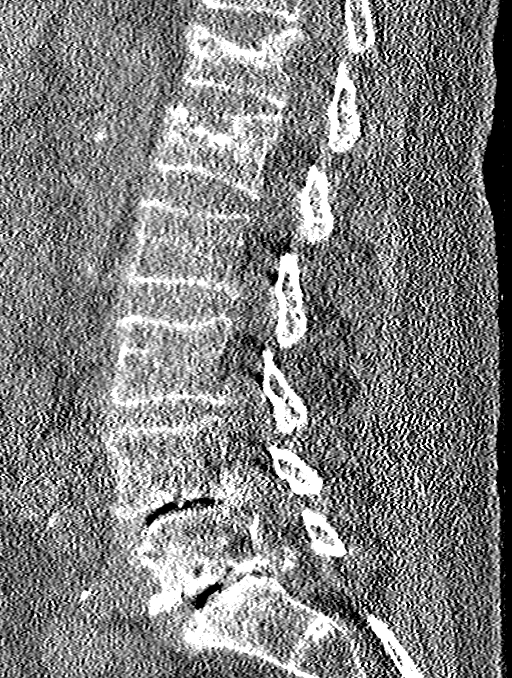
[im 41/81  bone]
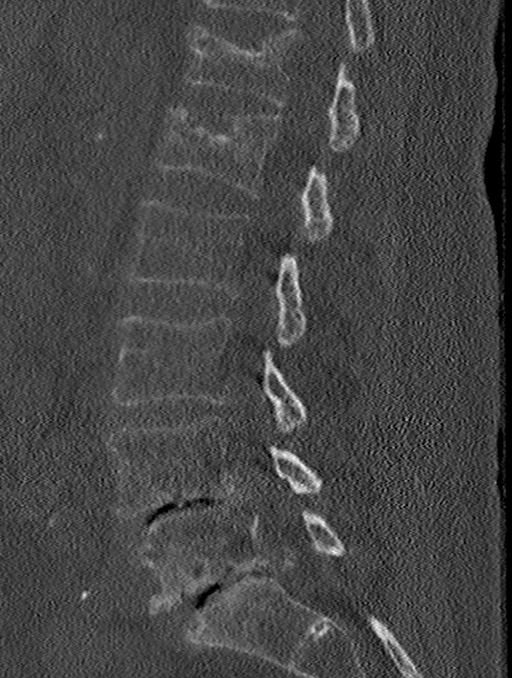
[im 47/81  bone]
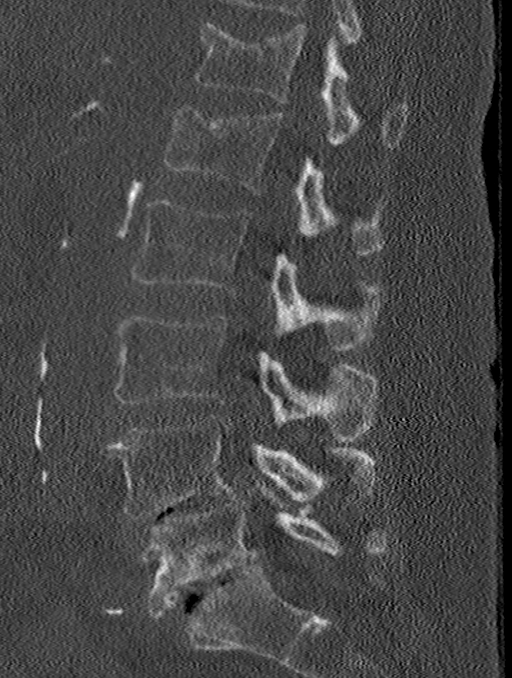
[im 54/81  bone]
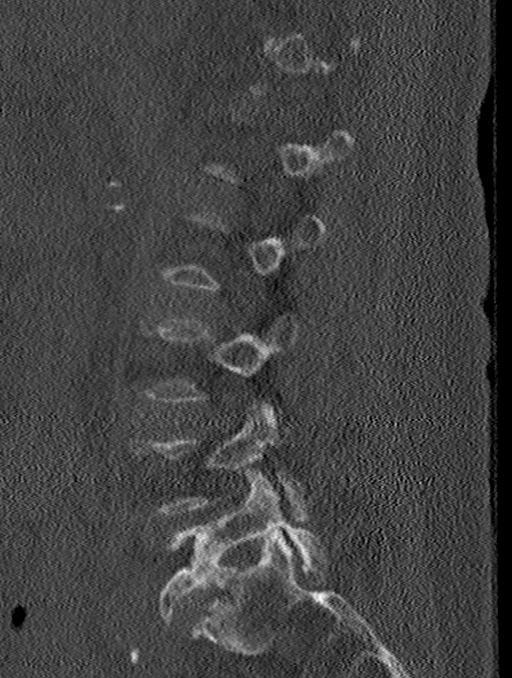

[Series 6: l-spine cor · coronal · 0.31mm/px · 3 of 80 slices shown]
[im 16/80  bone]
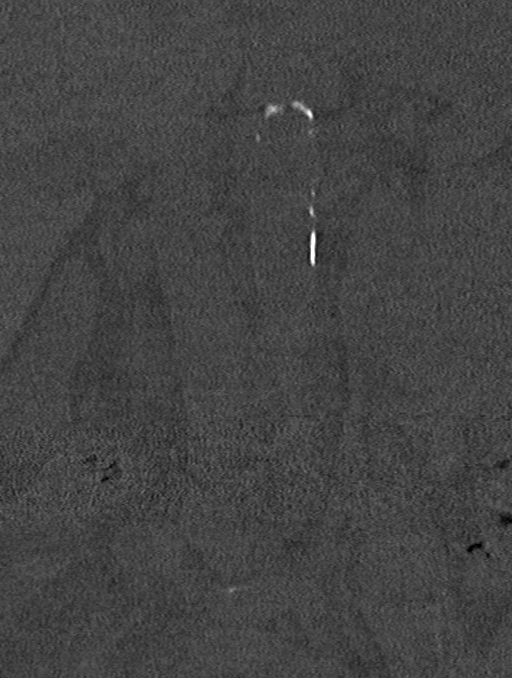
[im 32/80  bone]
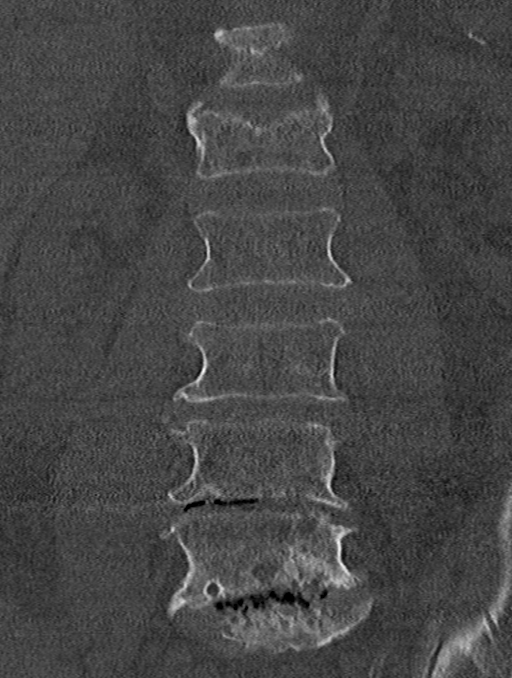
[im 48/80  bone]
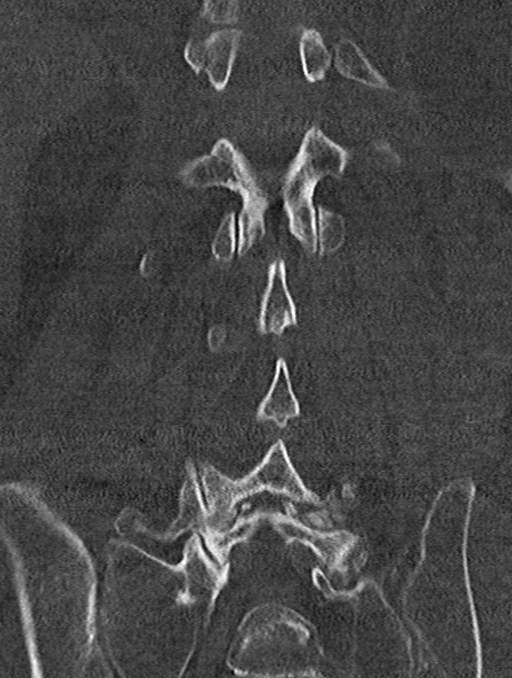

[12 of 33 positions shown; findings below may reference images not displayed]

FINDINGS: CT CHEST FINDINGS

Cardiovascular: The heart size is mildly enlarged. There are
advanced coronary artery calcifications. There are atherosclerotic
changes of the thoracic aorta without evidence for an aneurysm. The
intracardiac blood pool is hypodense relative to the adjacent
myocardium consistent with anemia. There is no large pericardial
effusion.

Mediastinum/Nodes:

-- No mediastinal lymphadenopathy.

-- No hilar lymphadenopathy.

-- No axillary lymphadenopathy.

-- No supraclavicular lymphadenopathy.

-- Normal thyroid gland where visualized.

-  Unremarkable esophagus.

Lungs/Pleura: There are moderate to large bilateral pleural
effusions, new since prior study. There is adjacent compressive
atelectasis. There is no pneumothorax. The trachea is unremarkable.

Musculoskeletal: Again identified are multilevel compression
fractures of the thoracic spine. These fractures all appear to be
essentially unchanged since the prior study in [REDACTED]. There are
multiple healing right-sided rib fractures. There is a questionable
nondisplaced fracture involving the posterior eleventh rib on the
left.

CT ABDOMEN PELVIS FINDINGS

Hepatobiliary: The liver is normal. Normal gallbladder.There is no
biliary ductal dilation.

Pancreas: Normal contours without ductal dilatation. No
peripancreatic fluid collection.

Spleen: Unremarkable.

Adrenals/Urinary Tract:

--Adrenal glands: Unremarkable.

--Right kidney/ureter: No hydronephrosis or radiopaque kidney
stones.

--Left kidney/ureter: No hydronephrosis or radiopaque kidney stones.

--Urinary bladder: Unremarkable.

Stomach/Bowel:

--Stomach/Duodenum: No hiatal hernia or other gastric abnormality.
Normal duodenal course and caliber.

--Small bowel: Unremarkable.

--Colon: Unremarkable.

--Appendix: Not visualized. No right lower quadrant inflammation or
free fluid.

Vascular/Lymphatic: Atherosclerotic calcification is present within
the non-aneurysmal abdominal aorta, without hemodynamically
significant stenosis.

--No retroperitoneal lymphadenopathy.

--No mesenteric lymphadenopathy.

--No pelvic or inguinal lymphadenopathy.

Reproductive: Unremarkable

Other: No ascites or free air. There is mild diffuse body wall
edema.

Musculoskeletal. An old L1 compression fractures again noted. There
is advanced disc height loss at the L4-L5 and L5-S1 levels. There is
no definite acute displaced fracture. The patient is status post
prior total hip arthroplasty on the right. The patient is status
post prior intramedullary nail placement on the left.
IMPRESSION: 1. Moderate to large bilateral pleural effusions with adjacent
compressive atelectasis, new since prior study.
2. No acute intra-abdominal or pelvic pathology.
3. Multiple healing right-sided rib fractures. Questionable
nondisplaced fracture involving the posterior eleventh rib on the
left.
4. Chronic compression fractures are noted throughout the
thoracolumbar spine. No new compression fracture was identified on
today's study.
5. Anasarca.
6. Anemia.

Aortic Atherosclerosis (27B88-V6Q.Q).

## 2021-12-25 IMAGING — CT CT CHEST W/O CM
2 of 4 series · 12 of 36 positions shown, 15 images · non-contrast
Comparison: 07/06/2020

CLINICAL DATA: Abdominal pain.  Back pain.

EXAM:
CT CHEST, ABDOMEN AND PELVIS WITHOUT CONTRAST
CT LUMBAR SPINE WITHOUT CONTRAST
TECHNIQUE: Multidetector CT imaging of the chest, abdomen and pelvis was
performed following the standard protocol without IV contrast.
Multiplanar CT images of the lumbar spine were reconstructed from
contemporary CT of the Chest, Abdomen, and Pelvis

[Series 3: cap wo 5.0 i31f 2 · axial · 0.98mm/px · z∈[-772,-272]mm · 9 of 121 slices shown, 12 images]
[im 11/121  mediastinal]
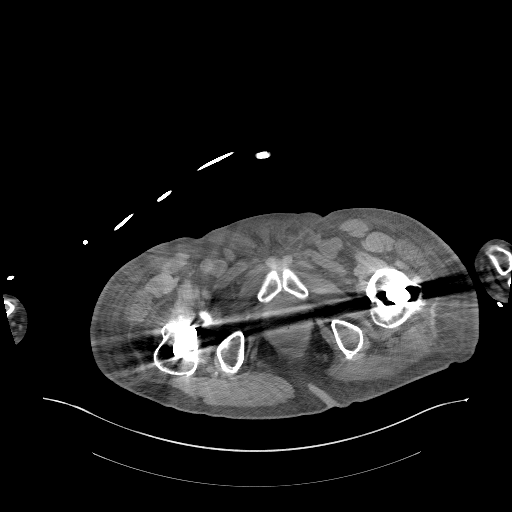
[im 11/121  lung]
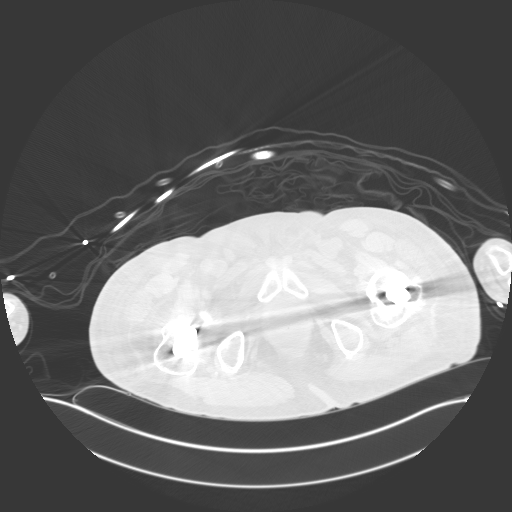
[im 21/121  lung]
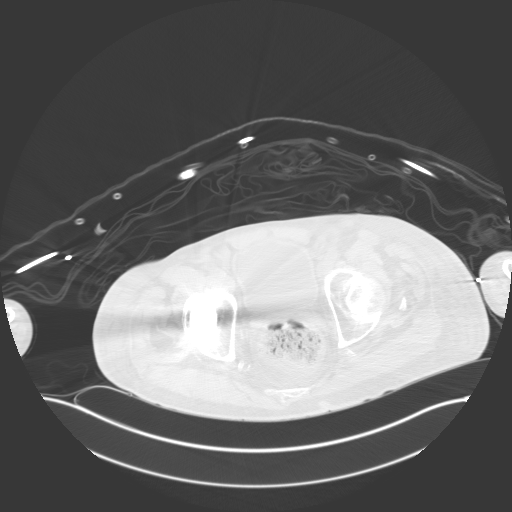
[im 41/121  lung]
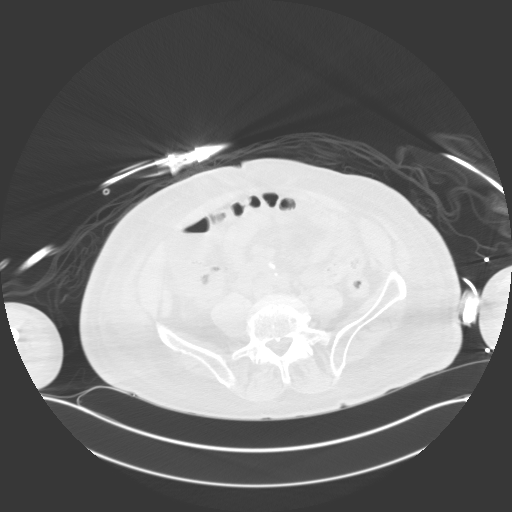
[im 51/121  lung]
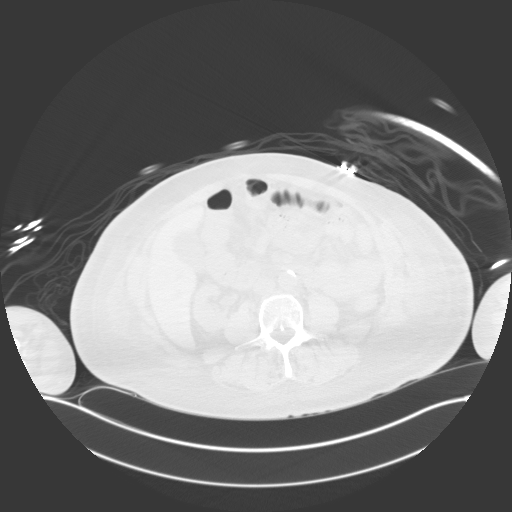
[im 61/121  mediastinal]
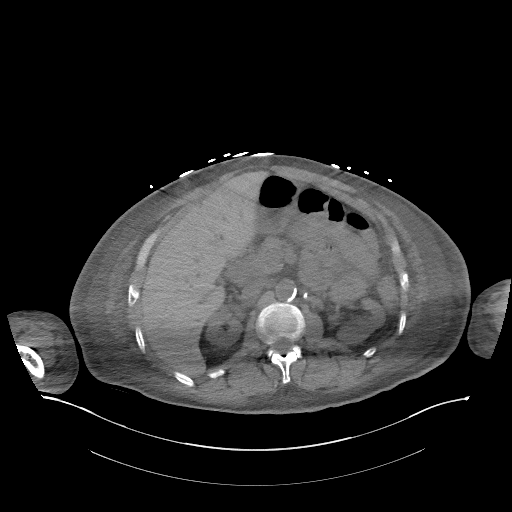
[im 61/121  lung]
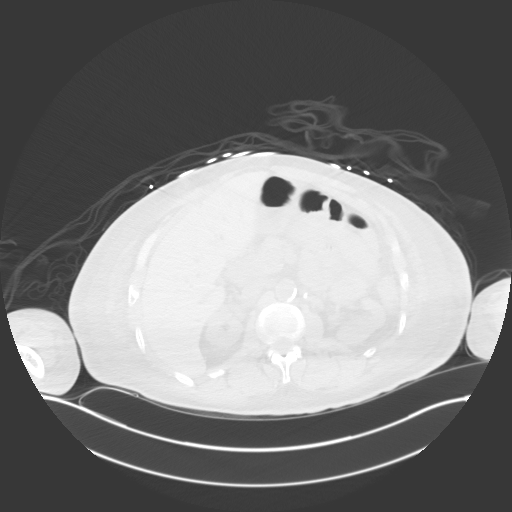
[im 71/121  lung]
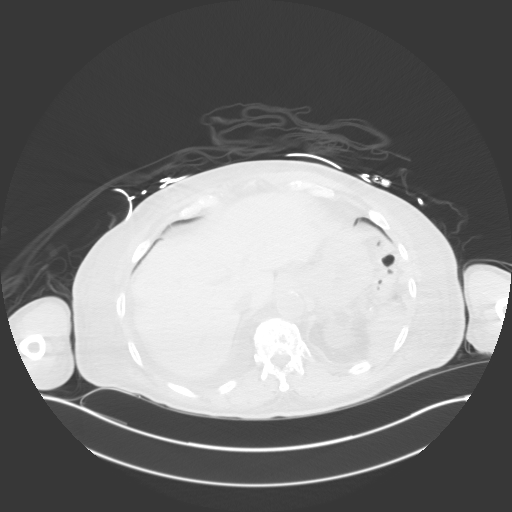
[im 81/121  lung]
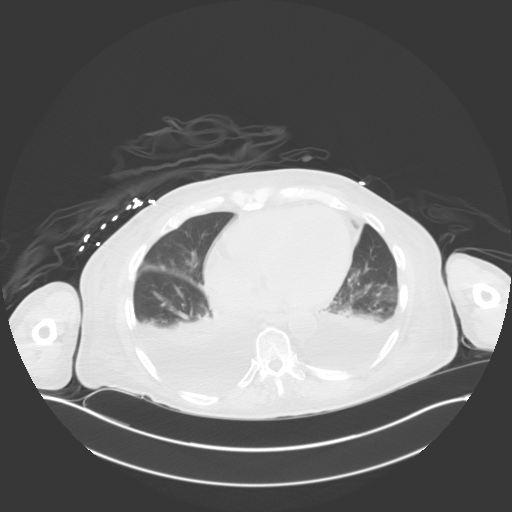
[im 101/121  lung]
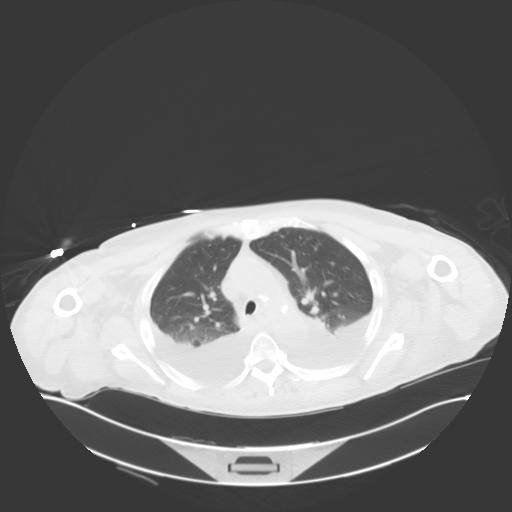
[im 111/121  mediastinal]
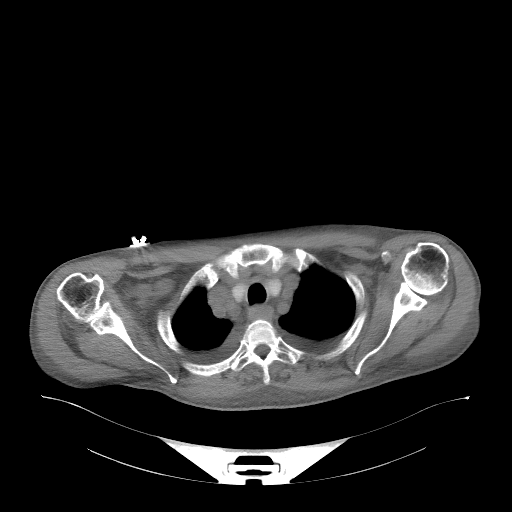
[im 111/121  lung]
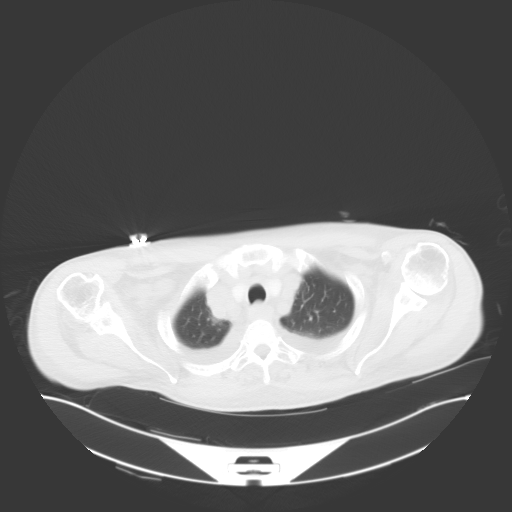

[Series 6: coronal · coronal · 0.71mm/px · 3 of 153 slices shown]
[im 31/153  lung]
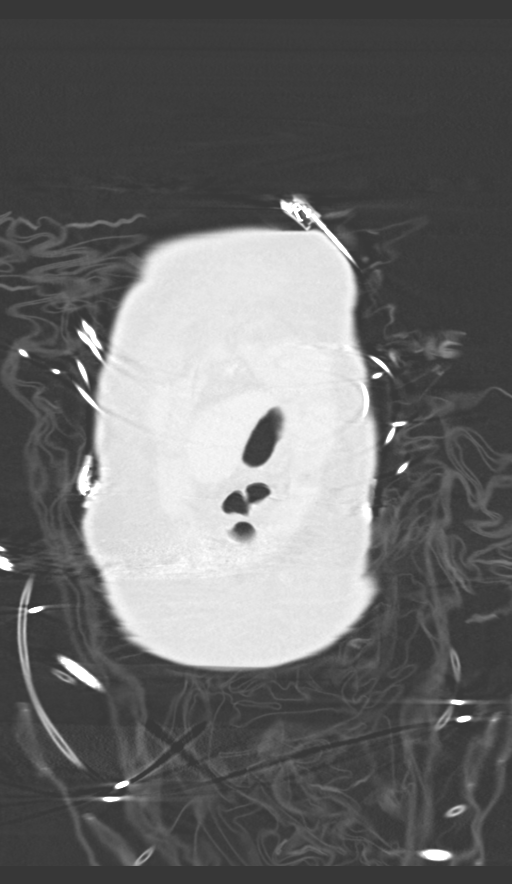
[im 61/153  lung]
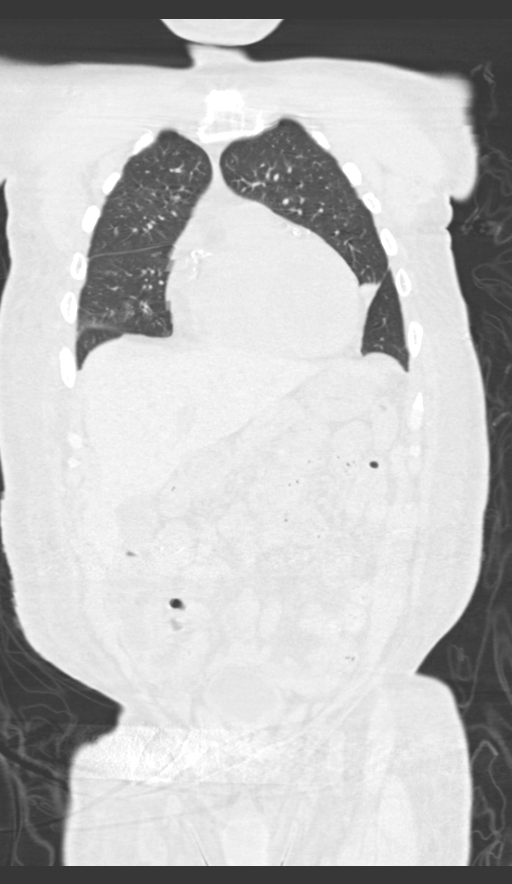
[im 92/153  lung]
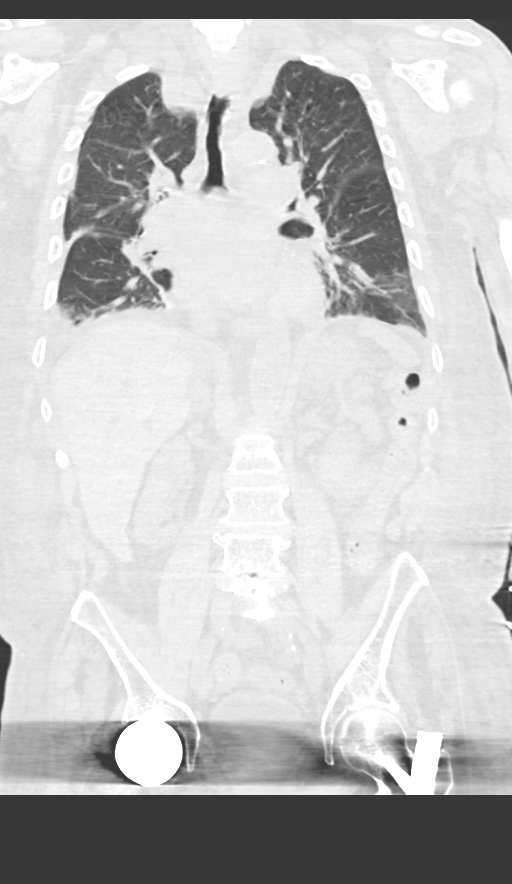

[12 of 36 positions shown; findings below may reference images not displayed]

FINDINGS: CT CHEST FINDINGS

Cardiovascular: The heart size is mildly enlarged. There are
advanced coronary artery calcifications. There are atherosclerotic
changes of the thoracic aorta without evidence for an aneurysm. The
intracardiac blood pool is hypodense relative to the adjacent
myocardium consistent with anemia. There is no large pericardial
effusion.

Mediastinum/Nodes:

-- No mediastinal lymphadenopathy.

-- No hilar lymphadenopathy.

-- No axillary lymphadenopathy.

-- No supraclavicular lymphadenopathy.

-- Normal thyroid gland where visualized.

-  Unremarkable esophagus.

Lungs/Pleura: There are moderate to large bilateral pleural
effusions, new since prior study. There is adjacent compressive
atelectasis. There is no pneumothorax. The trachea is unremarkable.

Musculoskeletal: Again identified are multilevel compression
fractures of the thoracic spine. These fractures all appear to be
essentially unchanged since the prior study in [REDACTED]. There are
multiple healing right-sided rib fractures. There is a questionable
nondisplaced fracture involving the posterior eleventh rib on the
left.

CT ABDOMEN PELVIS FINDINGS

Hepatobiliary: The liver is normal. Normal gallbladder.There is no
biliary ductal dilation.

Pancreas: Normal contours without ductal dilatation. No
peripancreatic fluid collection.

Spleen: Unremarkable.

Adrenals/Urinary Tract:

--Adrenal glands: Unremarkable.

--Right kidney/ureter: No hydronephrosis or radiopaque kidney
stones.

--Left kidney/ureter: No hydronephrosis or radiopaque kidney stones.

--Urinary bladder: Unremarkable.

Stomach/Bowel:

--Stomach/Duodenum: No hiatal hernia or other gastric abnormality.
Normal duodenal course and caliber.

--Small bowel: Unremarkable.

--Colon: Unremarkable.

--Appendix: Not visualized. No right lower quadrant inflammation or
free fluid.

Vascular/Lymphatic: Atherosclerotic calcification is present within
the non-aneurysmal abdominal aorta, without hemodynamically
significant stenosis.

--No retroperitoneal lymphadenopathy.

--No mesenteric lymphadenopathy.

--No pelvic or inguinal lymphadenopathy.

Reproductive: Unremarkable

Other: No ascites or free air. There is mild diffuse body wall
edema.

Musculoskeletal. An old L1 compression fractures again noted. There
is advanced disc height loss at the L4-L5 and L5-S1 levels. There is
no definite acute displaced fracture. The patient is status post
prior total hip arthroplasty on the right. The patient is status
post prior intramedullary nail placement on the left.
IMPRESSION: 1. Moderate to large bilateral pleural effusions with adjacent
compressive atelectasis, new since prior study.
2. No acute intra-abdominal or pelvic pathology.
3. Multiple healing right-sided rib fractures. Questionable
nondisplaced fracture involving the posterior eleventh rib on the
left.
4. Chronic compression fractures are noted throughout the
thoracolumbar spine. No new compression fracture was identified on
today's study.
5. Anasarca.
6. Anemia.

Aortic Atherosclerosis (27B88-[id number].Q).

## 2022-02-01 IMAGING — DX DG CHEST 1V PORT
1 series · 1 of 1 positions shown · non-contrast
Comparison: 10/18/2020

CLINICAL DATA: Altered mental status.  Slurred speech for 3 days.

EXAM:
PORTABLE CHEST 1 VIEW

[chest]
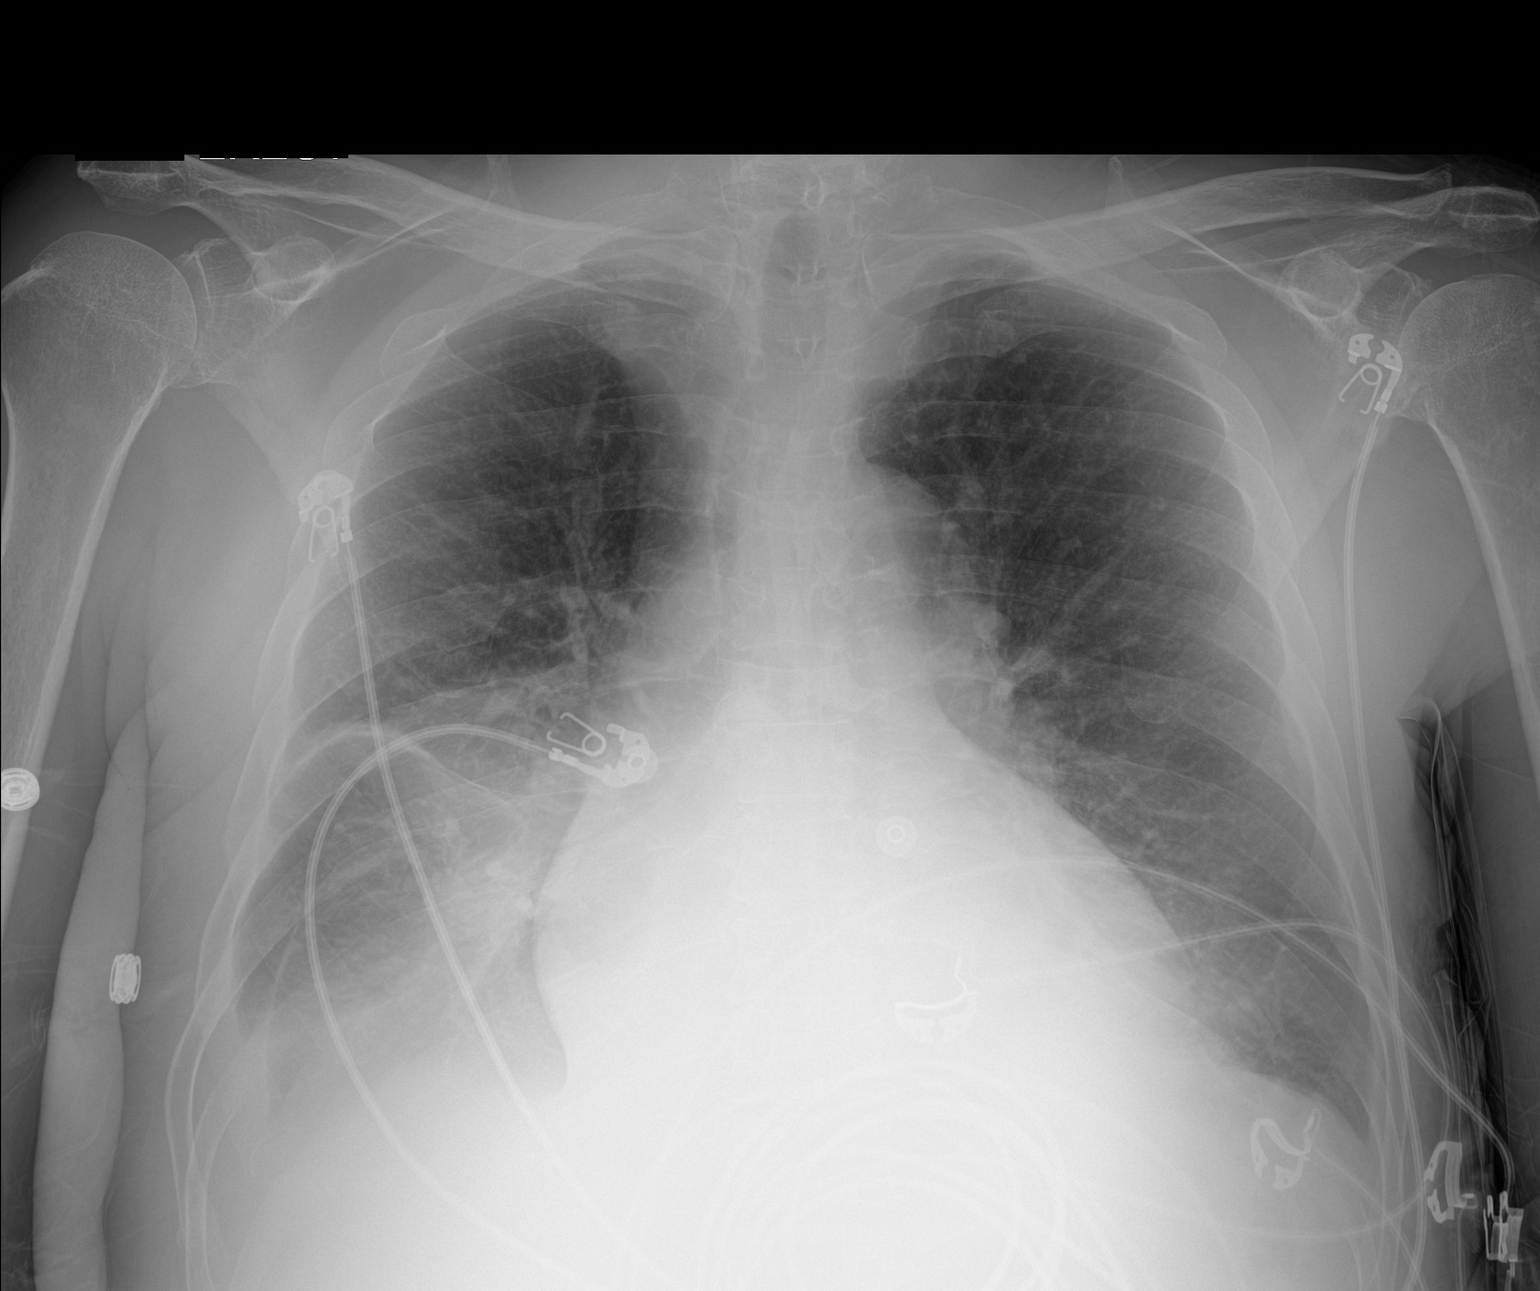

[1 of 1 positions shown; findings below may reference images not displayed]

FINDINGS: Airspace opacities are noted the right extending from the perihilar
region to the lung base obscuring hemidiaphragm. There is additional
opacity at the left lung base, mostly retrocardiac. Remainder of the
lungs is clear.

Right greater than left pleural effusions.

No evidence of a pneumothorax.

Cardiac silhouette is normal in size. No mediastinal or hilar
masses.

Skeletal structures are grossly intact.
IMPRESSION: 1. Right greater than left pleural effusions associated with lung
base opacity, consistent with atelectasis, as noted on the prior
chest CT. No convincing pneumonia and no evidence of pulmonary
edema. Overall without significant change from the prior chest
radiograph/chest CT.

## 2022-02-01 IMAGING — CT CT HEAD W/O CM
4 series · 16 of 47 positions shown, 18 images · non-contrast
Comparison: 10/18/2020

CLINICAL DATA: Slurred speech for 3 days. Decline in mental status
for 1 week. Multiple falls.

EXAM:
CT HEAD WITHOUT CONTRAST
TECHNIQUE: Contiguous axial images were obtained from the base of the skull
through the vertex without intravenous contrast.

[Series 3: head bone · axial · 0.42mm/px · z∈[-37,+15]mm · 4 of 79 slices shown]
[im 8/79  bone]
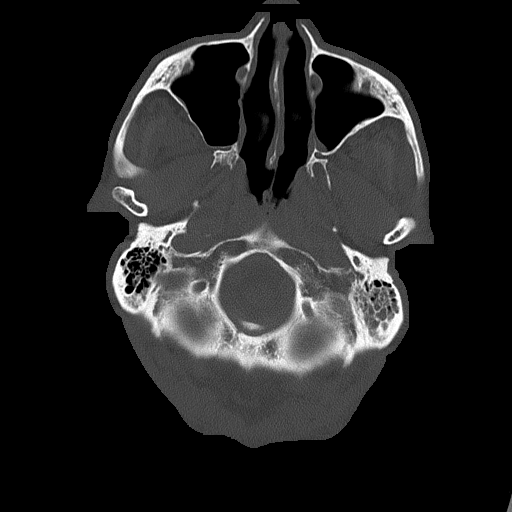
[im 15/79  bone]
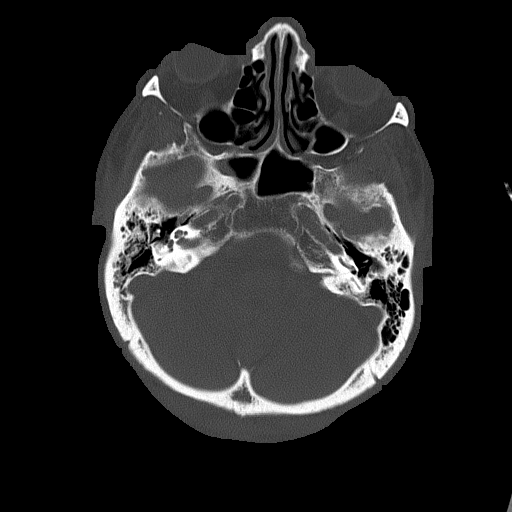
[im 27/79  bone]
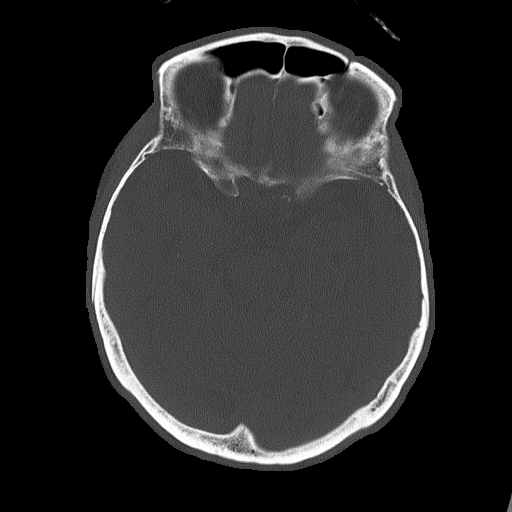
[im 34/79  bone]
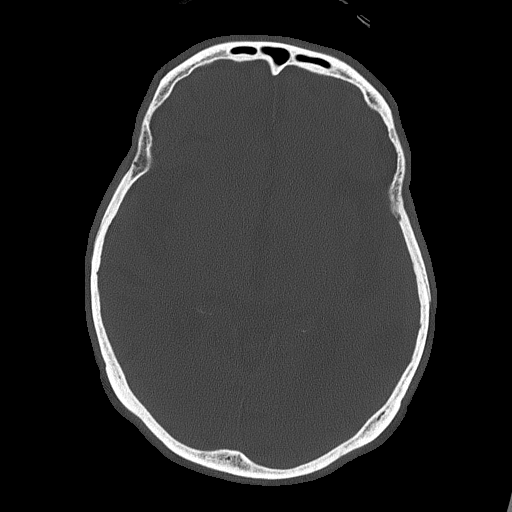

[Series 4: head without · axial · non-contrast · 0.42mm/px · z∈[-31,+74]mm · 6 of 31 slices shown, 8 images]
[im 5/31  brain]
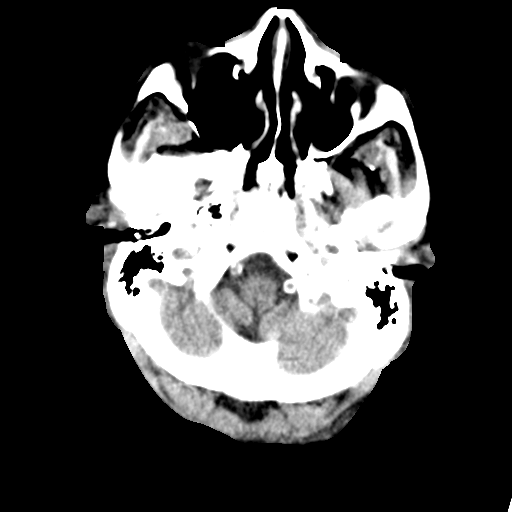
[im 5/31  bone]
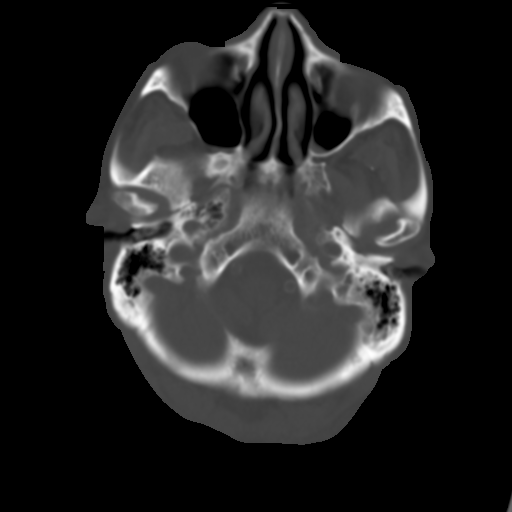
[im 9/31  brain]
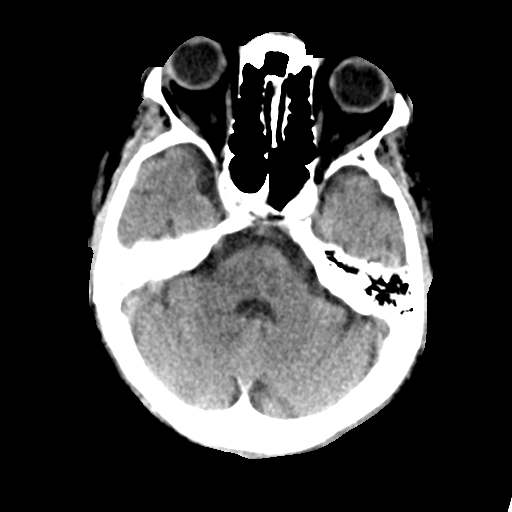
[im 13/31  brain]
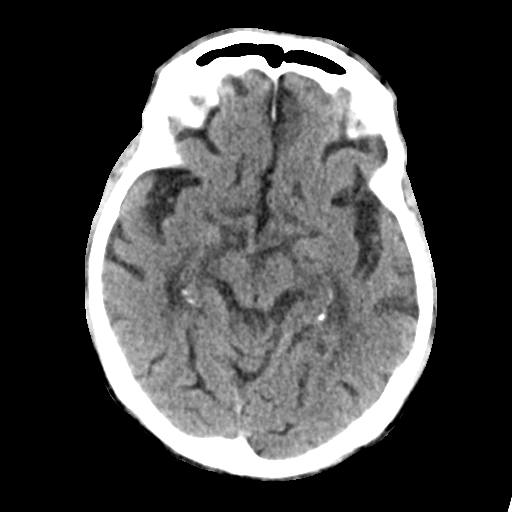
[im 18/31  brain]
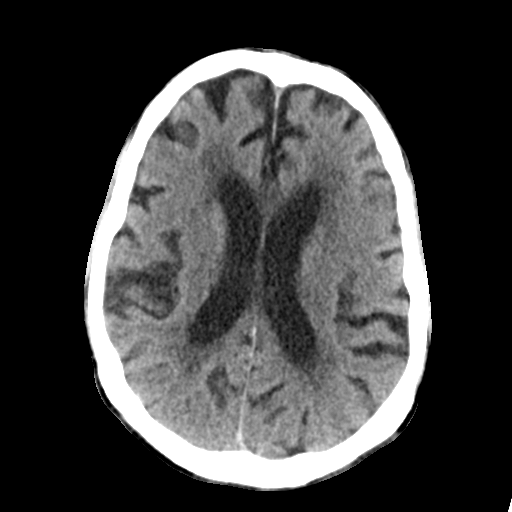
[im 22/31  brain]
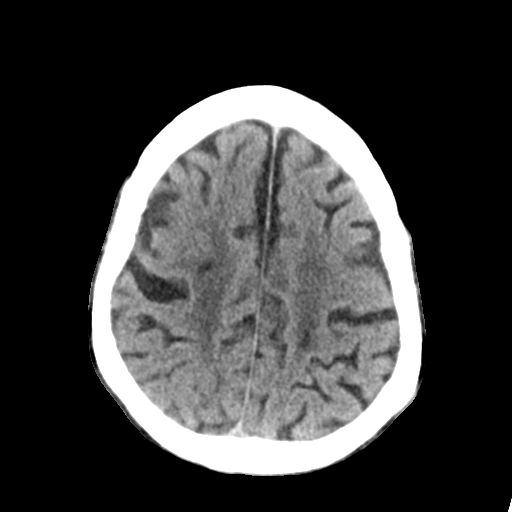
[im 22/31  bone]
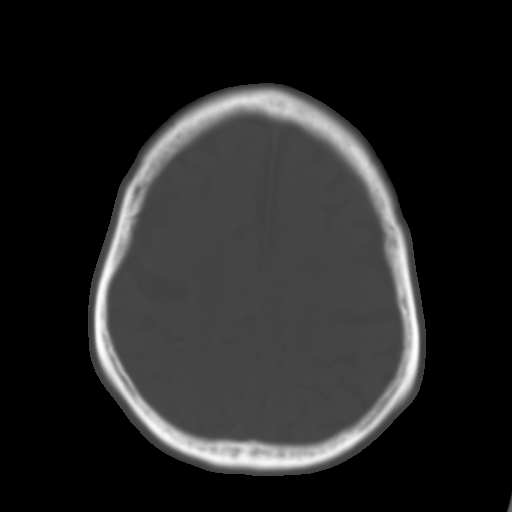
[im 26/31  brain]
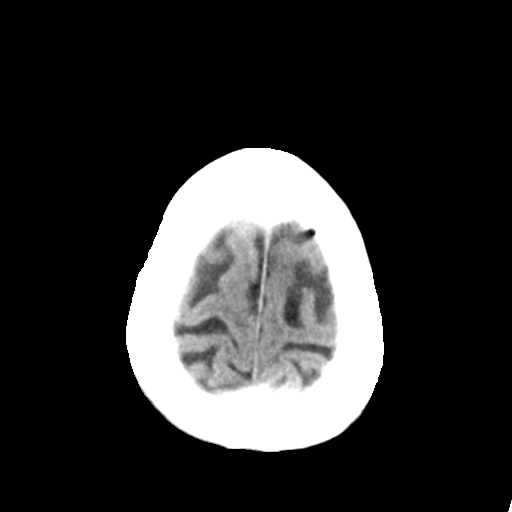

[Series 5: head without cor · coronal · non-contrast · 0.31mm/px · 3 of 67 slices shown]
[im 23/67  brain]
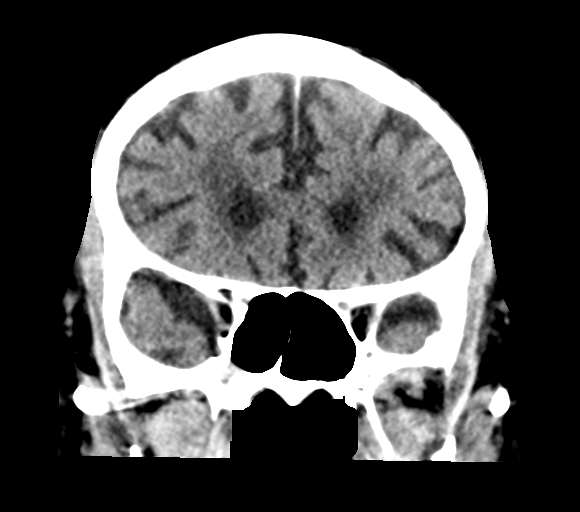
[im 30/67  brain]
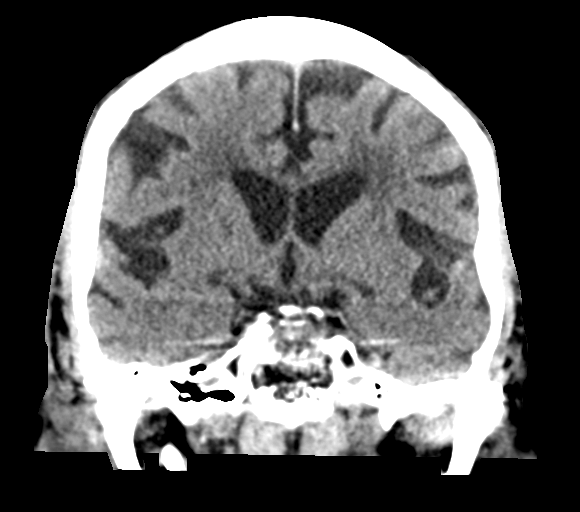
[im 37/67  brain]
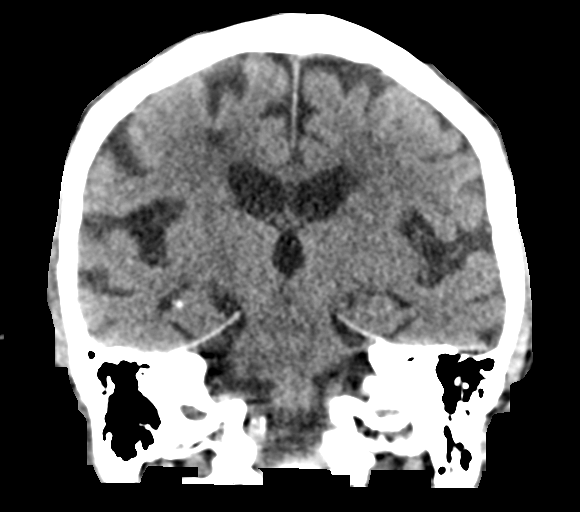

[Series 6: head without sag · sagittal · non-contrast · 0.31mm/px · 3 of 56 slices shown]
[im 19/56  brain]
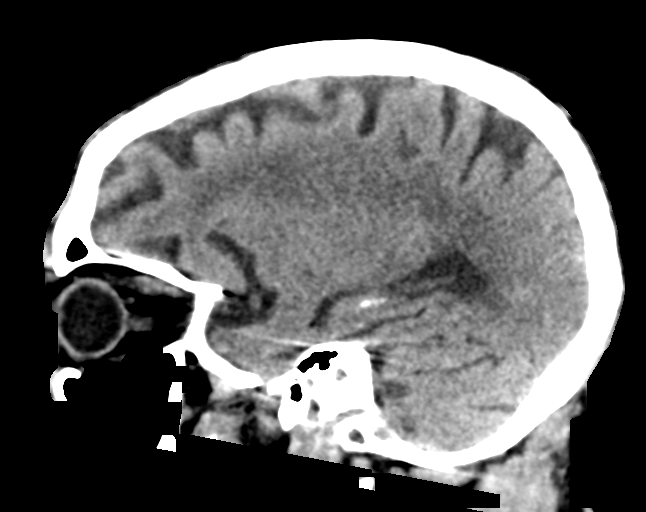
[im 28/56  brain]
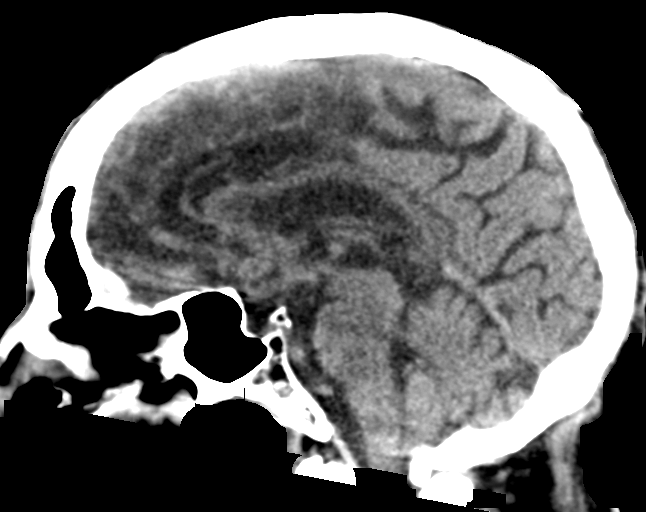
[im 37/56  brain]
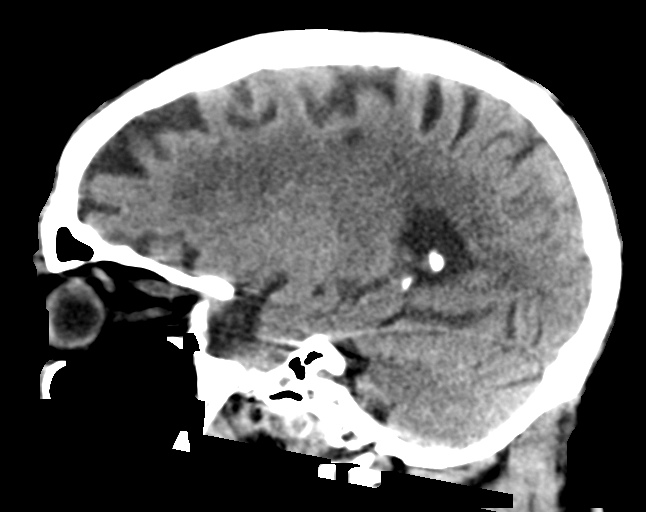

[16 of 47 positions shown; findings below may reference images not displayed]

FINDINGS: Brain: Small single remote right and left lacunar infarcts in the
corona radiata bilaterally. Periventricular white matter and corona
radiata hypodensities favor chronic ischemic microvascular white
matter disease.

Otherwise, the brainstem, cerebellum, cerebral peduncles, thalamus,
basal ganglia, basilar cisterns, and ventricular system appear
within normal limits. No intracranial hemorrhage, mass lesion, or
acute CVA.

Vascular: There is atherosclerotic calcification of the cavernous
carotid arteries bilaterally.

Skull: Unremarkable

Sinuses/Orbits: Chronic left maxillary sinusitis. Left mastoid
effusion. Mild chronic right sphenoid sinusitis.

Other: No supplemental non-categorized findings.
IMPRESSION: 1. No acute intracranial findings.
2. Periventricular white matter and corona radiata hypodensities
favor chronic ischemic microvascular white matter disease.
3. Small single chronic/remote lacunar infarcts in the corona
radiata bilaterally.
4. Chronic left maxillary sinusitis. Left mastoid effusion. Mild
chronic right sphenoid sinusitis.

## 2022-02-01 IMAGING — MR MR HEAD W/O CM
12 of 13 series · 44 of 48 positions shown · non-contrast
Comparison: Head CT yesterday.

CLINICAL DATA: Neurological deficit. Acute stroke suspected.
Slurred speech.

EXAM:
MRI HEAD WITHOUT CONTRAST
TECHNIQUE: Multiplanar, multiecho pulse sequences of the brain and surrounding
structures were obtained without intravenous contrast.

[Series 5: DWI · axial · 3.0mm · 0.88mm/px · z∈[-121,+23]mm · 8 of 100 slices shown (1 of 4)]
[im 1/100]
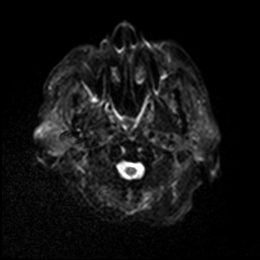
[im 15/100]
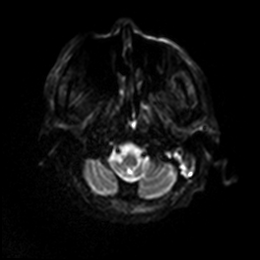
[im 29/100]
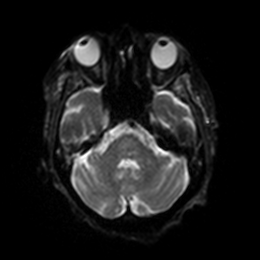
[im 43/100]
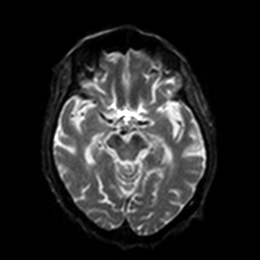
[im 57/100]
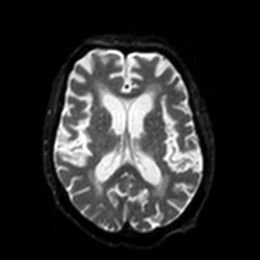
[im 71/100]
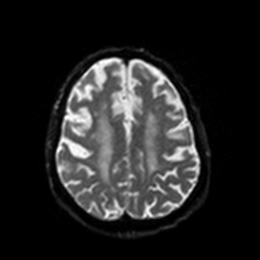
[im 85/100]
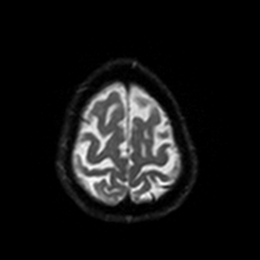
[im 100/100]
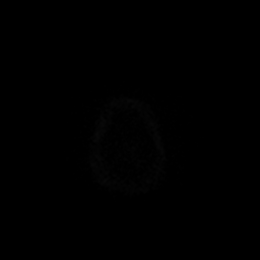

[Series 6: DWI · axial · 3.0mm · 0.88mm/px · z∈[-121,+23]mm · 4 of 50 slices shown (2 of 4)]
[im 1/50]
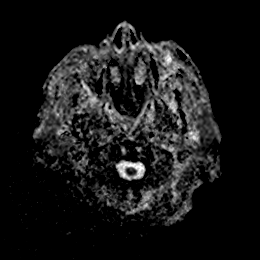
[im 17/50]
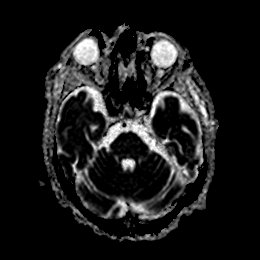
[im 33/50]
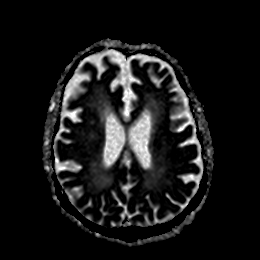
[im 50/50]
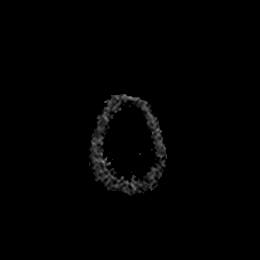

[Series 7: DWI · coronal · 4.0mm · 0.88mm/px · 5 of 66 slices shown (3 of 4)]
[im 1/66]
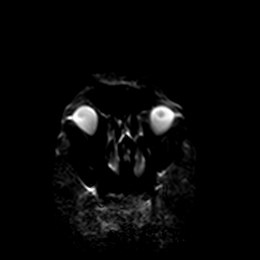
[im 17/66]
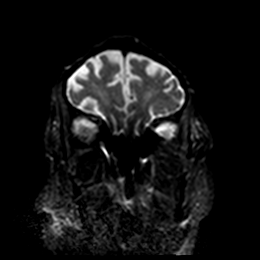
[im 33/66]
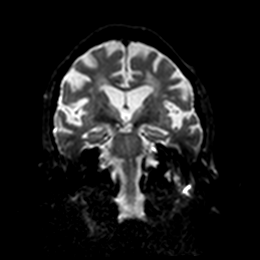
[im 49/66]
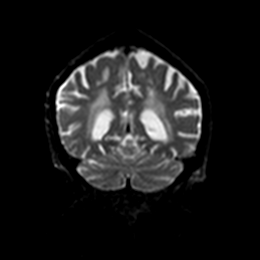
[im 66/66]
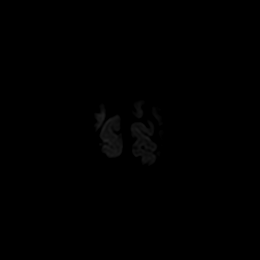

[Series 8: DWI · coronal · 4.0mm · 0.88mm/px · 3 of 32 slices shown (4 of 4)]
[im 1/32]
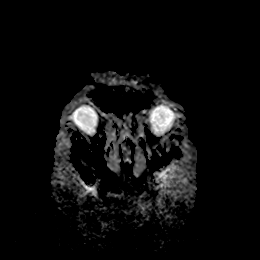
[im 16/32]
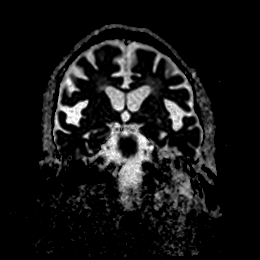
[im 32/32]
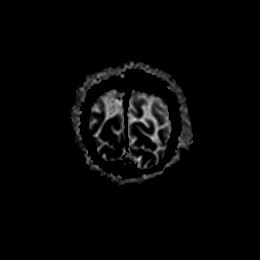

[Series 9: T1 · sagittal · 5.0mm · 0.75mm/px · 2 of 25 slices shown]
[im 1/25]
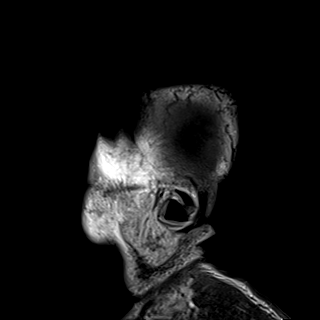
[im 25/25]
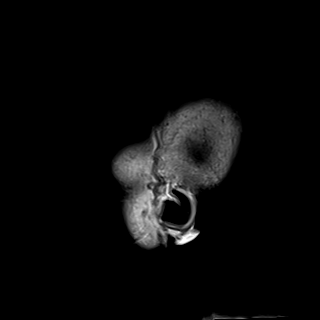

[Series 10: T2 · axial · 5.0mm · 0.72mm/px · z∈[-121,+21]mm · 2 of 25 slices shown (1 of 2)]
[im 1/25]
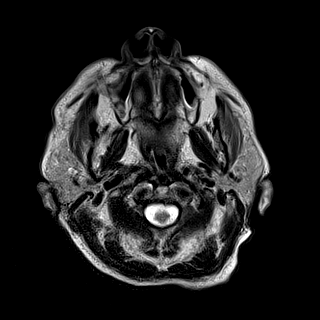
[im 25/25]
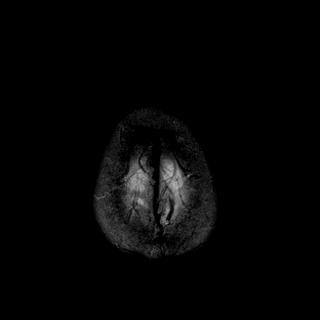

[Series 11: FLAIR · axial · 5.0mm · 0.45mm/px · z∈[-118,+23]mm · 2 of 25 slices shown]
[im 1/25]
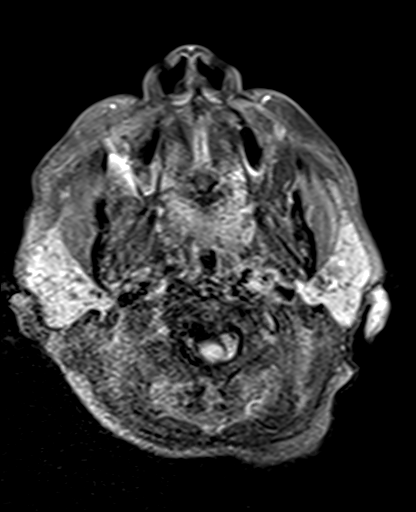
[im 25/25]
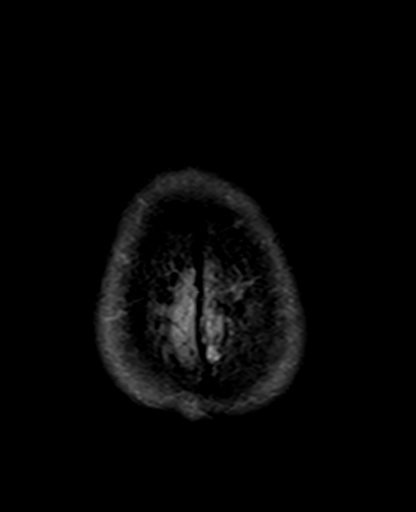

[Series 12: mag_images · axial · 3.0mm · 0.90mm/px · z∈[-122,+28]mm · 4 of 52 slices shown]
[im 1/52]
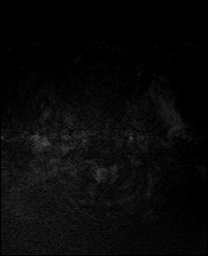
[im 18/52]
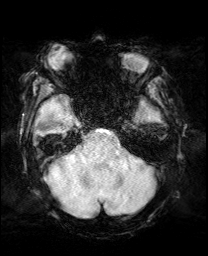
[im 35/52]
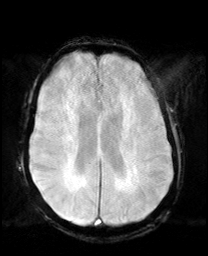
[im 52/52]
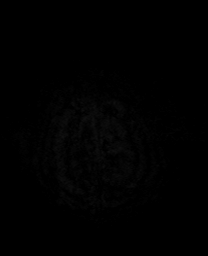

[Series 13: pha_images · axial · 3.0mm · 0.90mm/px · z∈[-122,+28]mm · 4 of 52 slices shown]
[im 1/52]
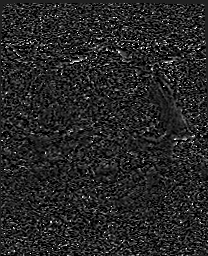
[im 18/52]
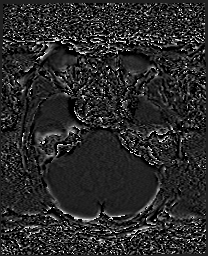
[im 35/52]
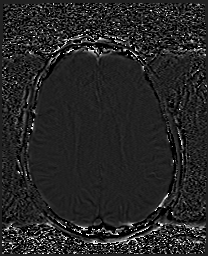
[im 52/52]
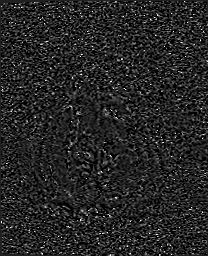

[Series 14: swi_images · axial · 3.0mm · 0.90mm/px · z∈[-122,+28]mm · 4 of 52 slices shown]
[im 1/52]
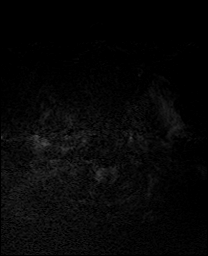
[im 18/52]
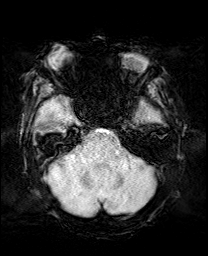
[im 35/52]
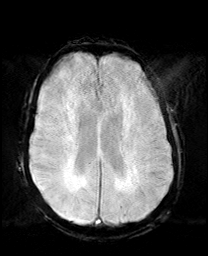
[im 52/52]
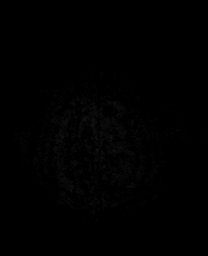

[Series 15: mip_images(sw) · axial · 24.0mm · 0.90mm/px · z∈[-112,+18]mm · 4 of 45 slices shown]
[im 1/45]
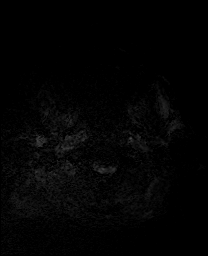
[im 15/45]
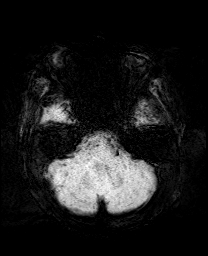
[im 30/45]
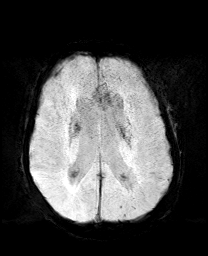
[im 45/45]
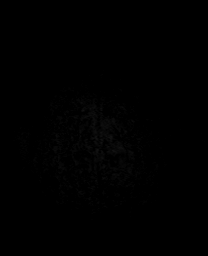

[Series 17: T2 · coronal · 5.0mm · 0.34mm/px · 2 of 29 slices shown (2 of 2)]
[im 1/29]
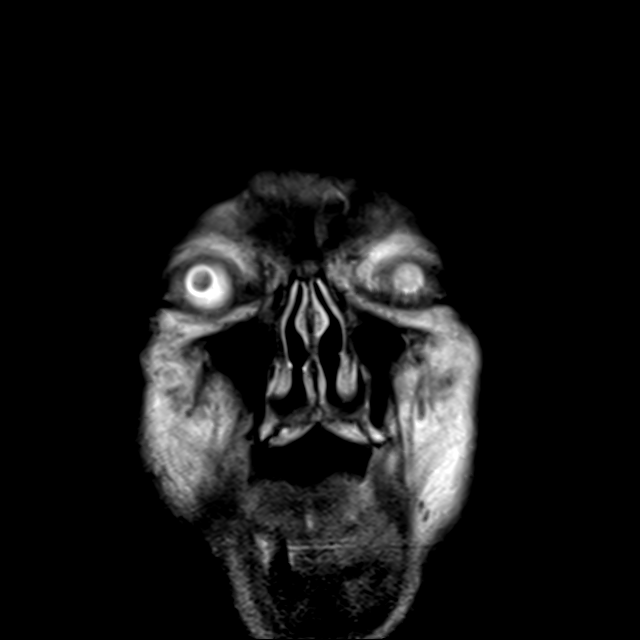
[im 29/29]
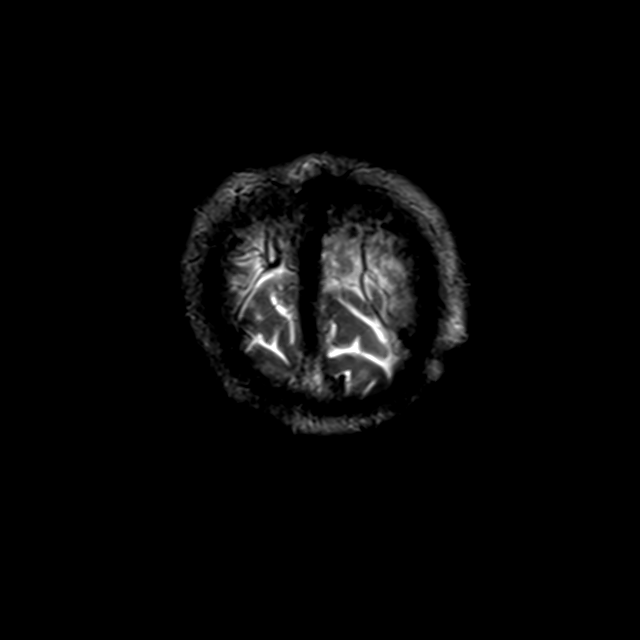

[44 of 48 positions shown; findings below may reference images not displayed]

FINDINGS: Brain: Diffusion imaging does not show any acute or subacute
infarction. Chronic small-vessel ischemic changes affect pons. No
focal cerebellar insult. Cerebral hemispheres show an old small
vessel infarction in the right thalamus and extensive chronic
small-vessel ischemic changes throughout the hemispheric white
matter. There is an old right parietal cortical and subcortical
infarction and an old cortical and subcortical infarction at the
right parietal vertex. No intra-axial mass lesion, hemorrhage,
hydrocephalus or extra-axial fluid collection. There is a small
meningioma at the anterior aspect of the anterior cranial fossa on
the right measuring 11 mm in diameter with a thickness 4-5 mm. No
mass-effect upon the brain.

Vascular: Major vessels at the base of the brain show flow.

Skull and upper cervical spine: Negative

Sinuses/Orbits: Paranasal sinuses are clear. Orbits are negative.
There are bilateral mastoid effusions, more extensive on the left
than the right.

Other: None
IMPRESSION: 1. No acute finding by MRI. Extensive chronic small-vessel ischemic
changes throughout the brain. Old small right parietal cortical and
subcortical infarctions.
2. 11 x 5 mm meningioma at the anterior aspect of the anterior
cranial fossa on the right without mass-effect upon the brain.

## 2022-02-03 IMAGING — DX DG CHEST 1V PORT
1 series · 1 of 1 positions shown · non-contrast
Comparison: September 24, 2021

CLINICAL DATA: Pneumonia

EXAM:
PORTABLE CHEST 1 VIEW

[chest]
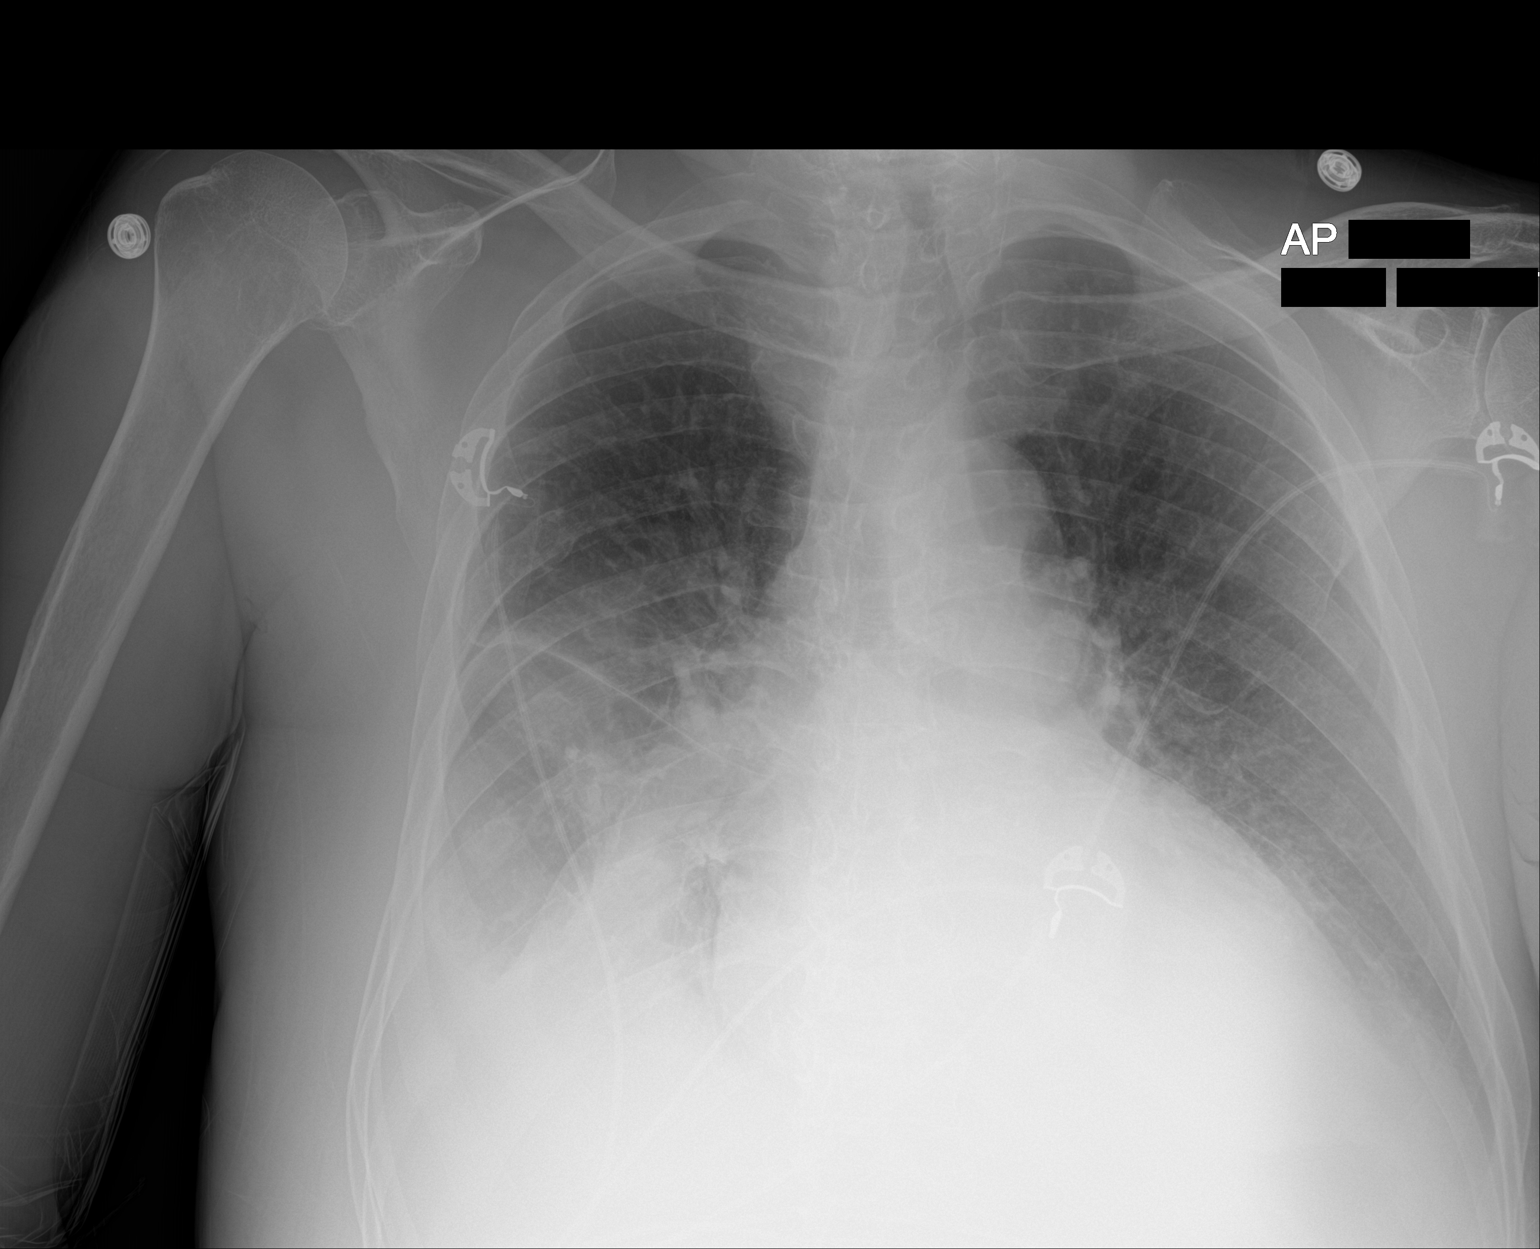

[1 of 1 positions shown; findings below may reference images not displayed]

FINDINGS: The heart size is unchanged. There are bilateral pleural effusions.
There is persistent bibasilar airspace disease which has progressed
since the prior study. There is no pneumothorax. No acute osseous
abnormality.
IMPRESSION: Persistent but growing bilateral pleural effusions. Persistent and
worsening bibasilar airspace disease.

## 2022-02-08 IMAGING — DX DG CHEST 1V PORT
1 series · 1 of 1 positions shown · non-contrast
Comparison: Portable exam 3955 hours compared to 11/27/2020

CLINICAL DATA: Questionable sepsis, history diabetes mellitus,
hypertension

EXAM:
PORTABLE CHEST 1 VIEW

[chest ap]
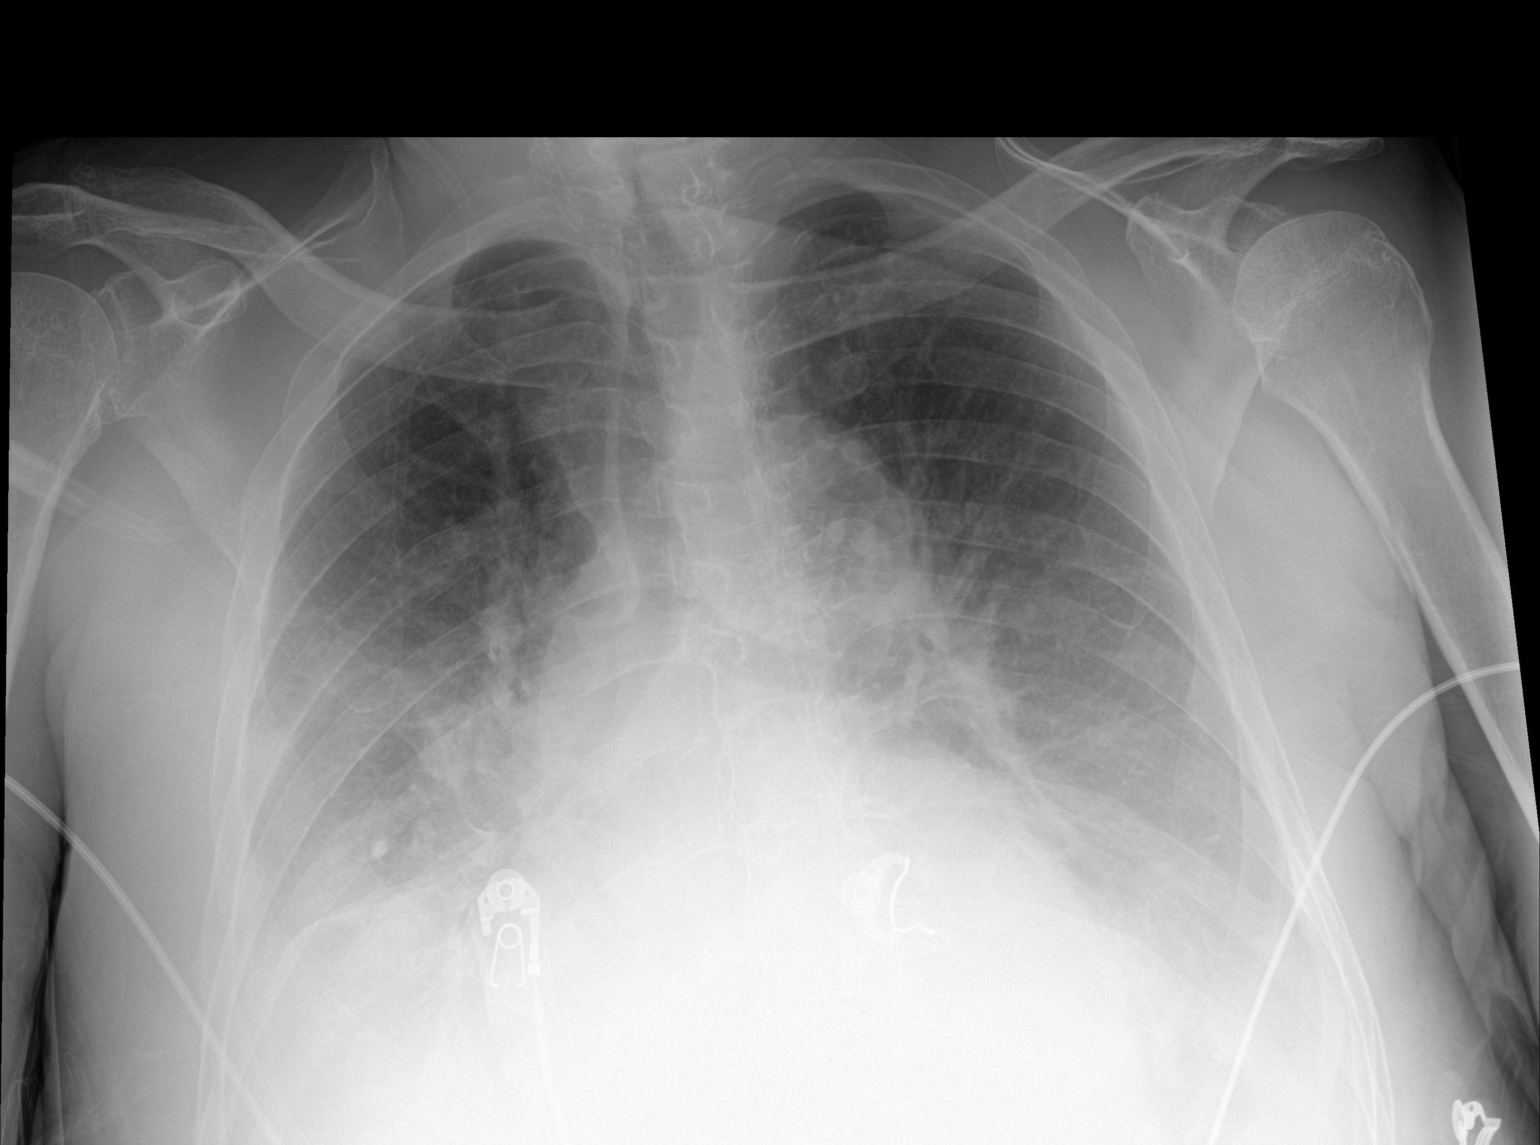

[1 of 1 positions shown; findings below may reference images not displayed]

FINDINGS: Enlargement of cardiac silhouette with slight vascular congestion.

Mediastinal contours normal.

BILATERAL pleural effusions and basilar atelectasis.

Mild perihilar to basilar infiltrates question pulmonary edema
versus pneumonia.

No pneumothorax or acute osseous findings.

Calcified granuloma lower RIGHT lung.
IMPRESSION: BILATERAL pleural effusions and basilar atelectasis.

Persistent pulmonary infiltrates question pulmonary edema versus
pneumonia.

## 2022-02-08 IMAGING — CT CT CERVICAL SPINE W/O CM
3 of 7 series · 12 of 33 positions shown, 13 images · non-contrast
Comparison: 11/25/2020 CT/MR and prior studies.

CLINICAL DATA: 74-year-old male with head and neck injury following
fall. Altered mental status.

EXAM:
CT HEAD WITHOUT CONTRAST
CT CERVICAL SPINE WITHOUT CONTRAST
TECHNIQUE: Multidetector CT imaging of the head and cervical spine was
performed following the standard protocol without intravenous
contrast. Multiplanar CT image reconstructions of the cervical spine
were also generated.

[Series 6: coronal soft tissue · coronal · 0.34mm/px · 3 of 72 slices shown]
[im 18/72  bone]
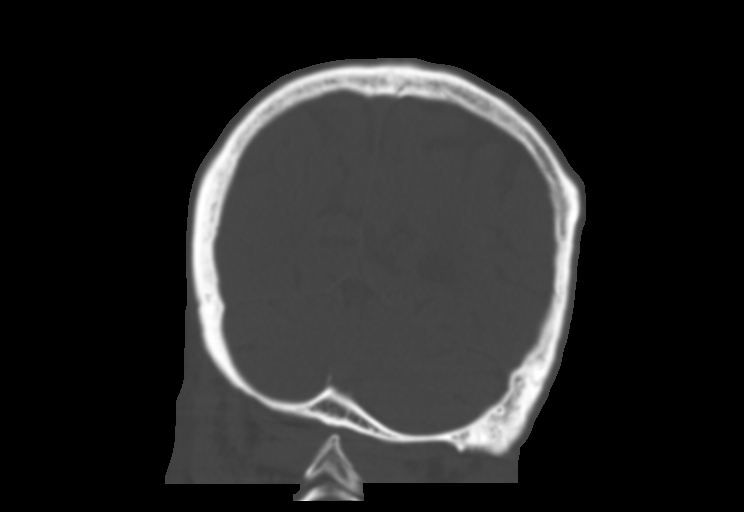
[im 36/72  bone]
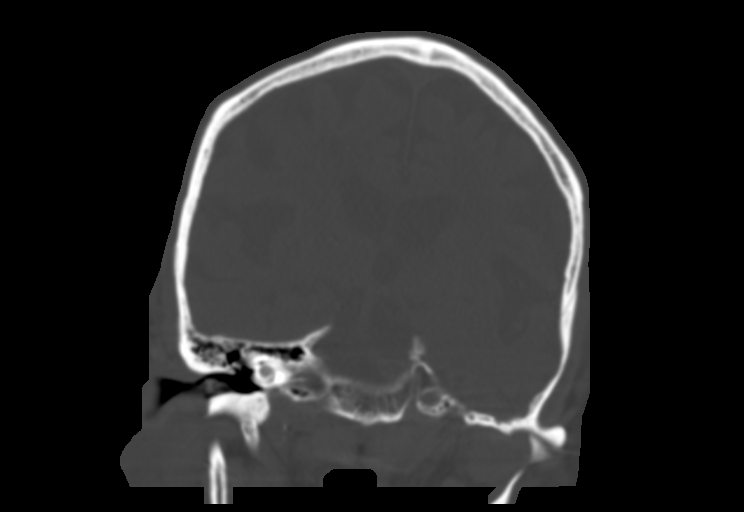
[im 54/72  bone]
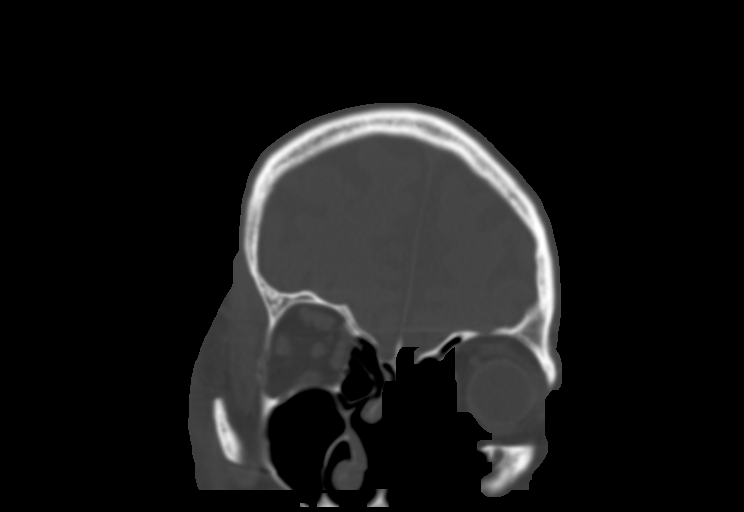

[Series 11: orthogonal bone · axial · 0.23mm/px · z∈[+407,+529]mm · 4 of 108 slices shown, 5 images]
[im 22/108  soft-tissue]
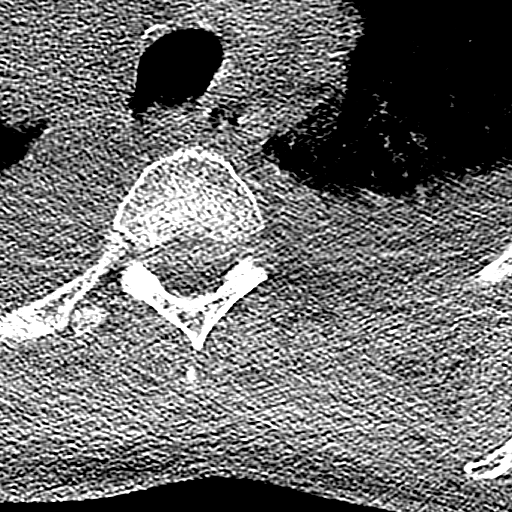
[im 22/108  bone]
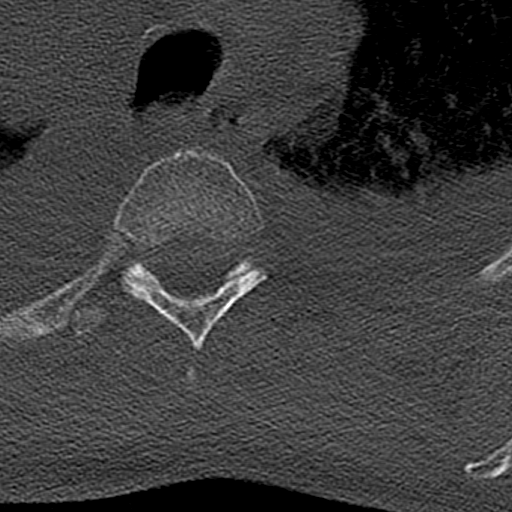
[im 43/108  bone]
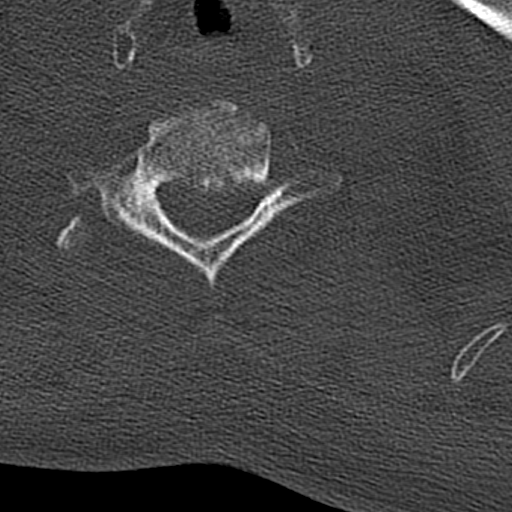
[im 65/108  bone]
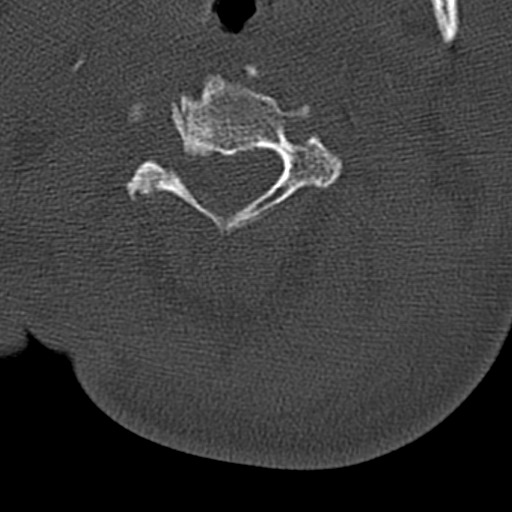
[im 86/108  bone]
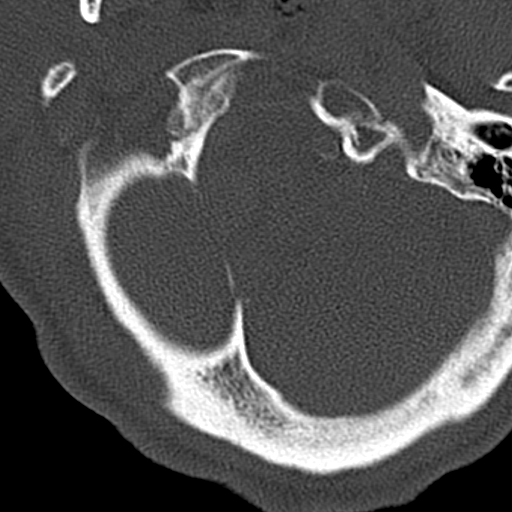

[Series 13: sagittal bone · sagittal · 0.28mm/px · 5 of 61 slices shown]
[im 11/61  bone]
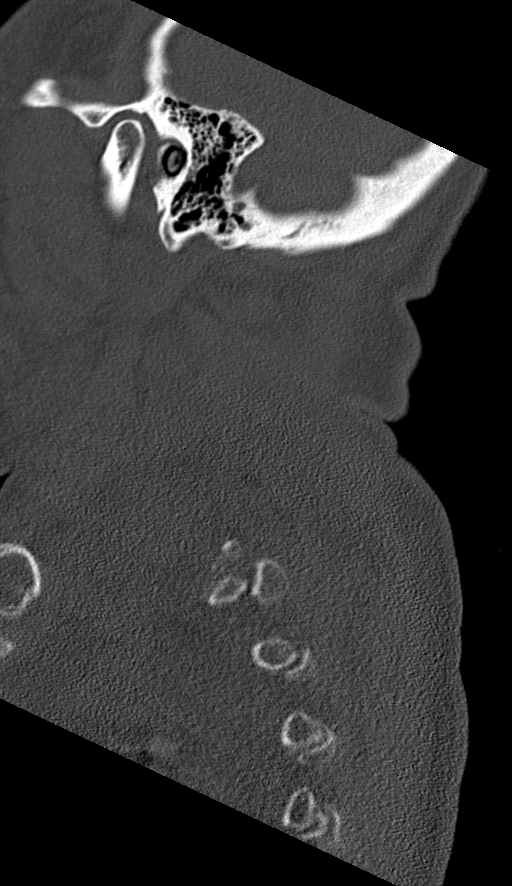
[im 21/61  bone]
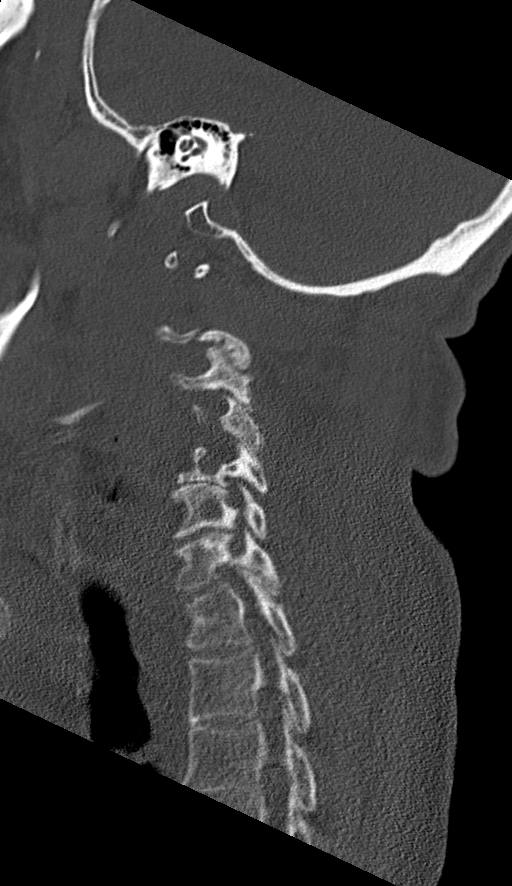
[im 31/61  bone]
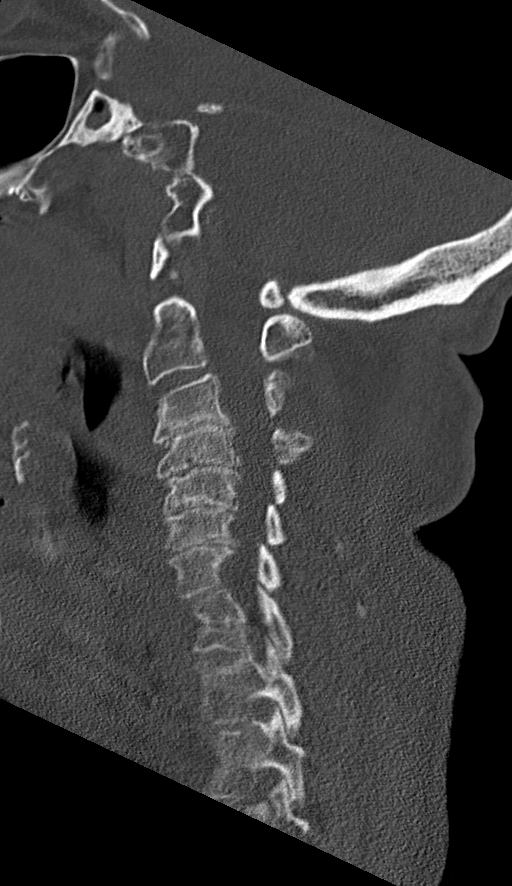
[im 41/61  bone]
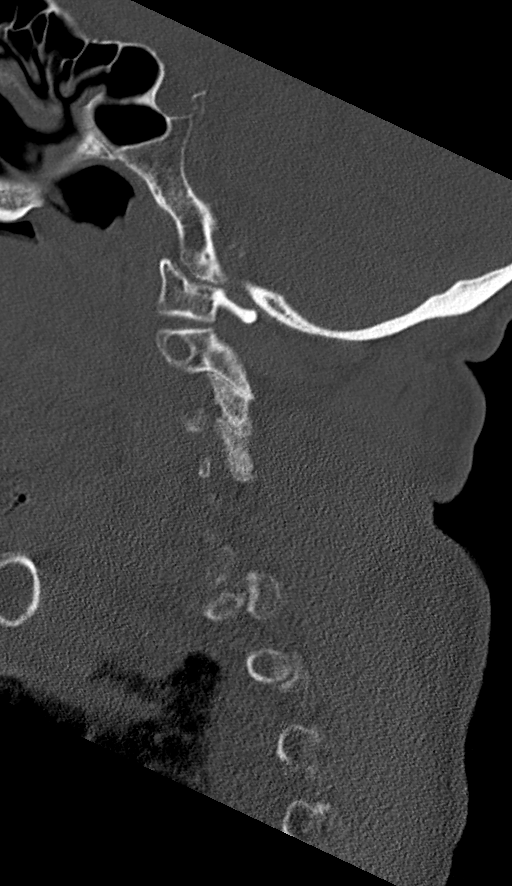
[im 51/61  bone]
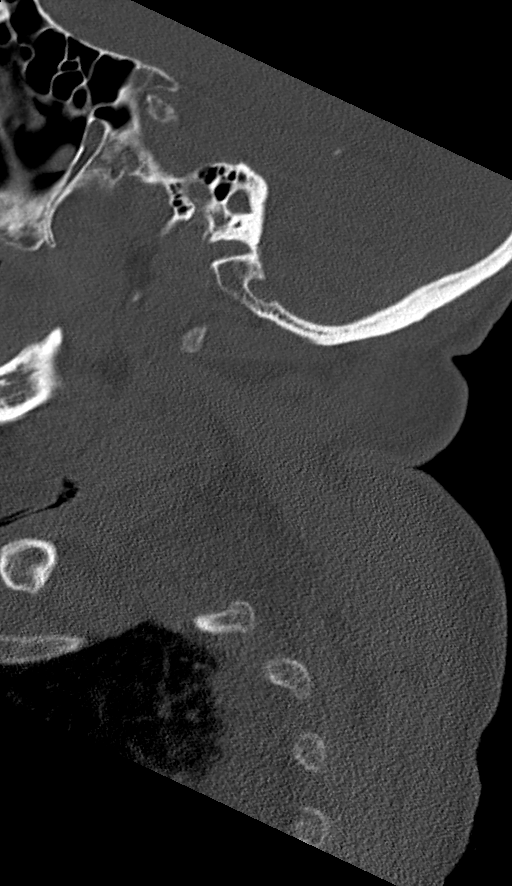

[12 of 33 positions shown; findings below may reference images not displayed]

FINDINGS: CT HEAD FINDINGS

Brain: No evidence of acute infarction, hemorrhage, hydrocephalus,
extra-axial collection or new mass lesion/mass effect.

A 0.5 x 1.1 cm extra axial mass/meningioma along the anterior aspect
of the RIGHT anterior cranial fossa is unchanged.

Atrophy and chronic small-vessel white matter ischemic changes are
again noted.

Vascular: Carotid atherosclerotic calcifications are noted.

Skull: Normal. Negative for fracture or focal lesion.

Sinuses/Orbits: No acute finding.

Other: None.

CT CERVICAL SPINE FINDINGS

Alignment: Loss of the normal cervical lordosis without acute
subluxation.

Skull base and vertebrae: No acute fracture. No primary bone lesion
or focal pathologic process.

Soft tissues and spinal canal: No prevertebral fluid or swelling. No
visible canal hematoma.

Disc levels: Moderate to severe multilevel degenerative disc disease
and spondylosis from C3-C7 again noted and again contributing to
central spinal and bony foraminal narrowing.

Upper chest: Bilateral pleural effusions are noted, at least
moderate sized. Ground-glass opacities within the lung apices are
noted.

Other: None
IMPRESSION: 1. No evidence of acute intracranial abnormality. Atrophy and
chronic small-vessel white matter ischemic changes.
2. No static evidence of acute injury to the cervical spine.
3. Bilateral pleural effusions, at least moderate sized, and
ground-glass opacities within the lung apices.

## 2022-02-08 IMAGING — CR DG HIP (WITH OR WITHOUT PELVIS) 3-4V BILAT
5 series · 5 of 5 positions shown · non-contrast
Comparison: 10/01/2016 and prior studies

CLINICAL DATA: Acute hip and pelvic pain following fall. Initial
encounter.

EXAM:
DG HIP (WITH OR WITHOUT PELVIS) 3-4V BILAT

[x pelvis]
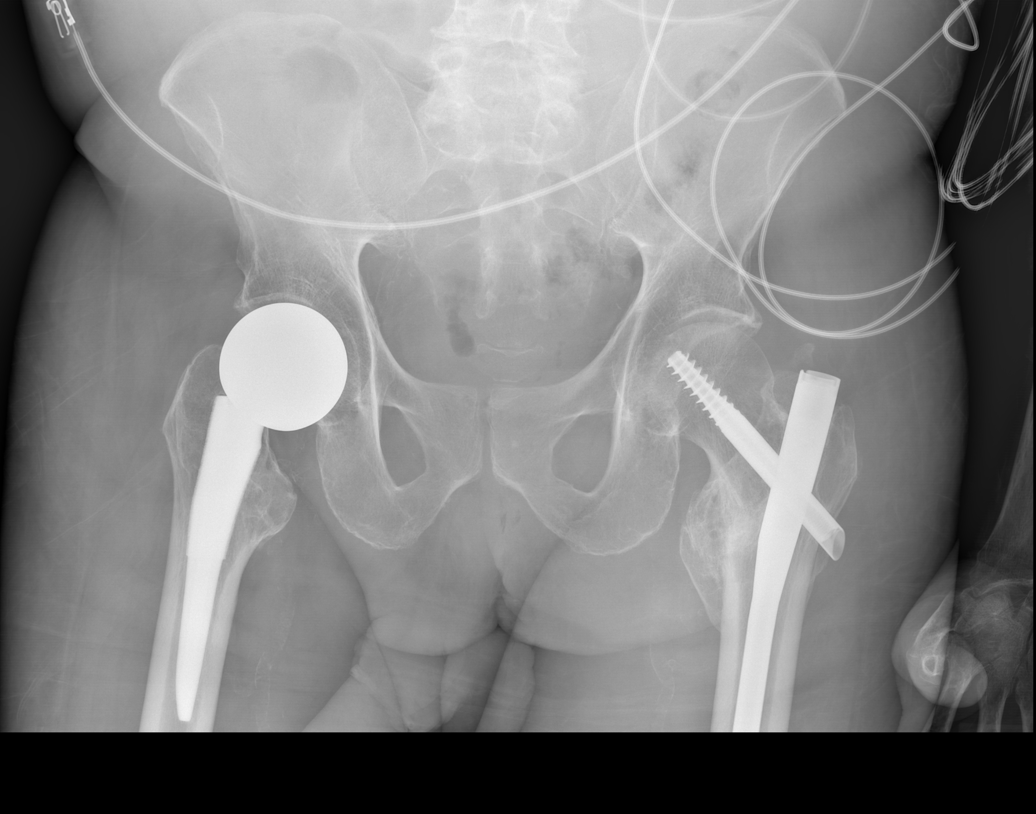

[x hip ap right]
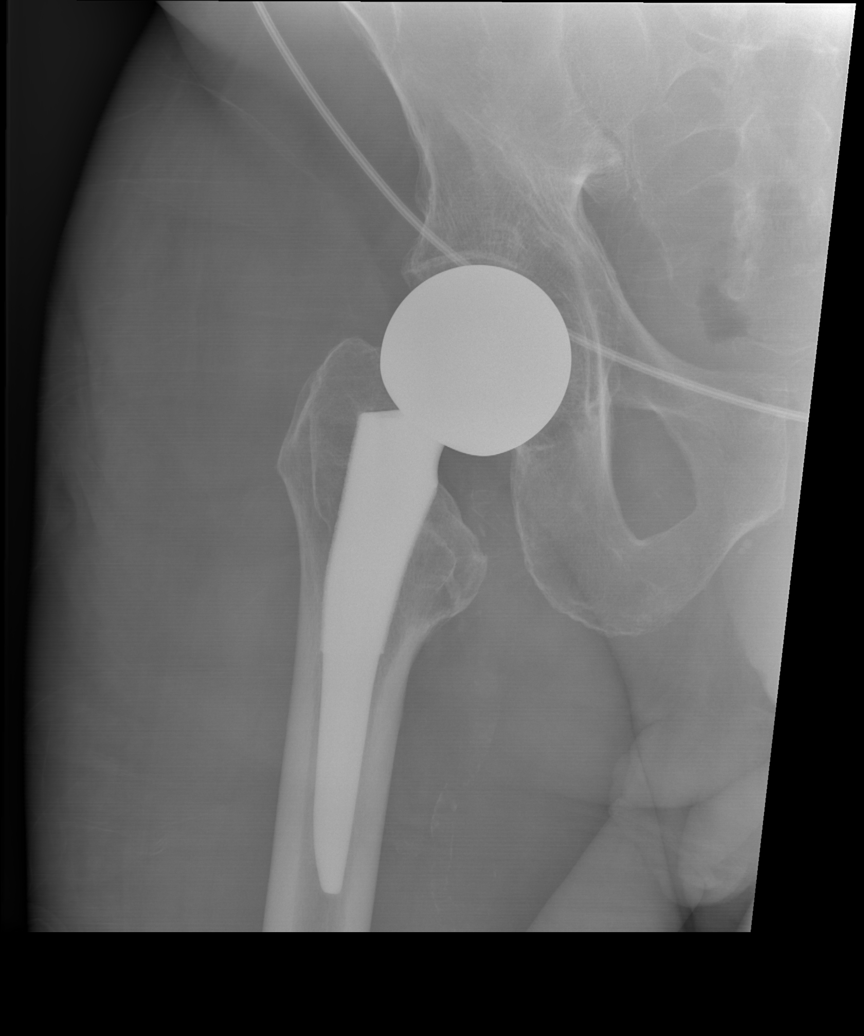

[x hip ap left]
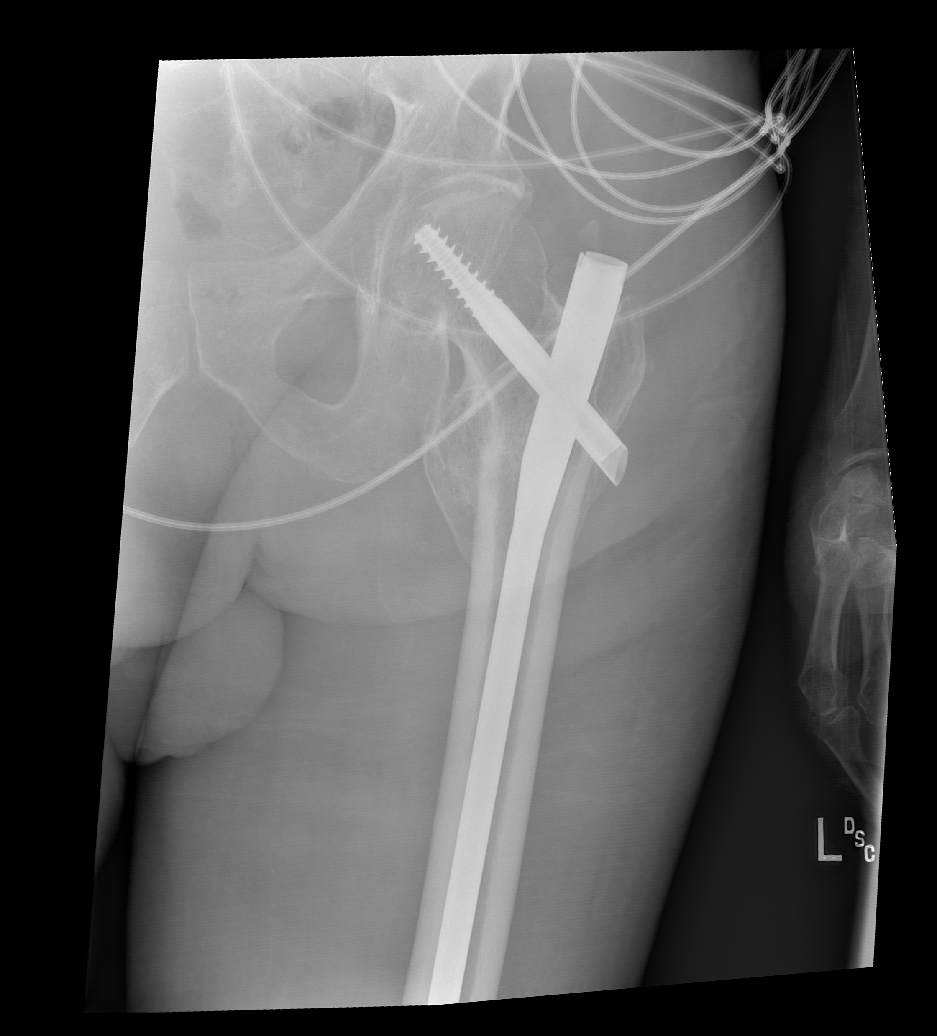

[w hip lat left]
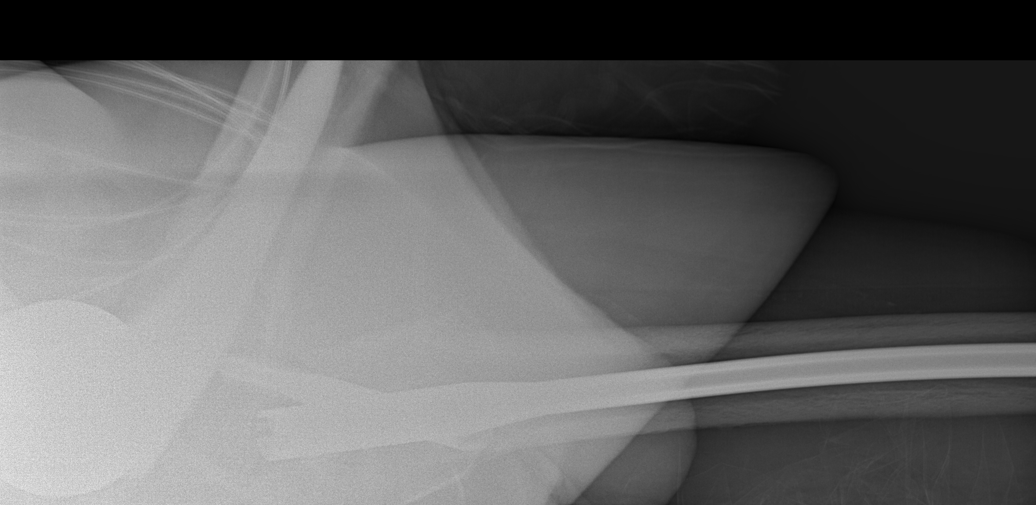

[w hip lat right]
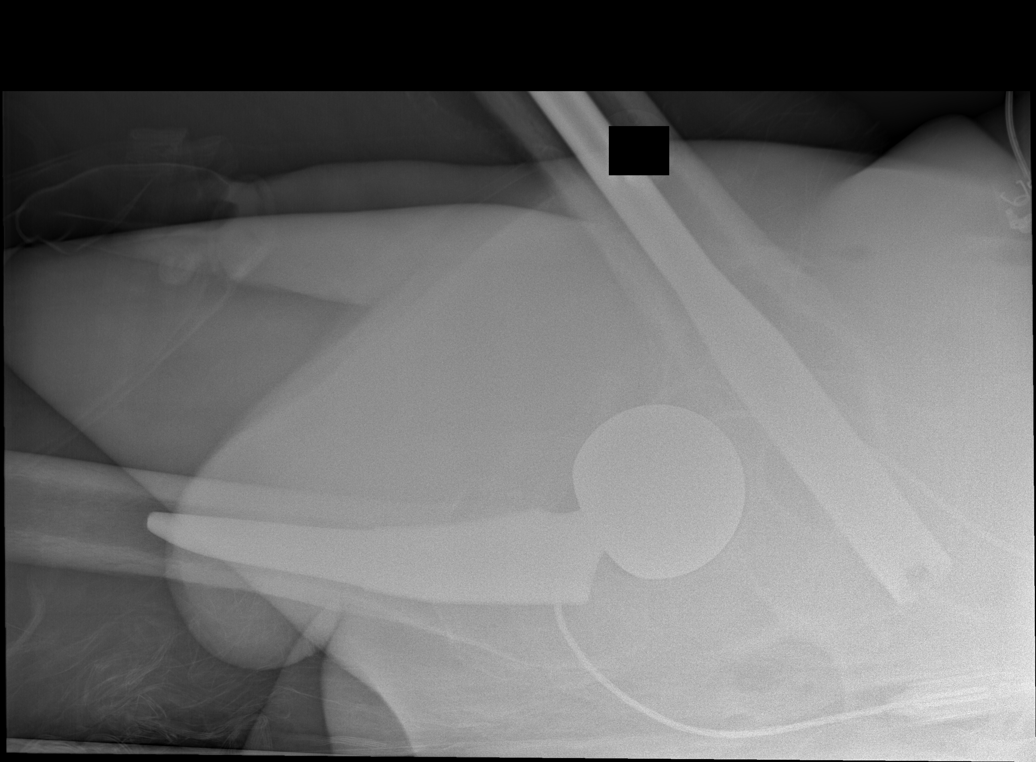

[5 of 5 positions shown; findings below may reference images not displayed]

FINDINGS: No acute fracture or dislocation identified.

RIGHT hip hemiarthroplasty and remote fracture/ORIF of the proximal
LEFT femur again noted.

No complicating hardware features are identified.

No focal bony lesions are noted.
IMPRESSION: No acute abnormality.

## 2022-02-26 IMAGING — DX DG CHEST 1V PORT
1 series · 1 of 1 positions shown · non-contrast
Comparison: Portable chest 12/02/2020 and earlier.

CLINICAL DATA: 74-year-old male with shortness of breath.

EXAM:
PORTABLE CHEST 1 VIEW

[chest ap]
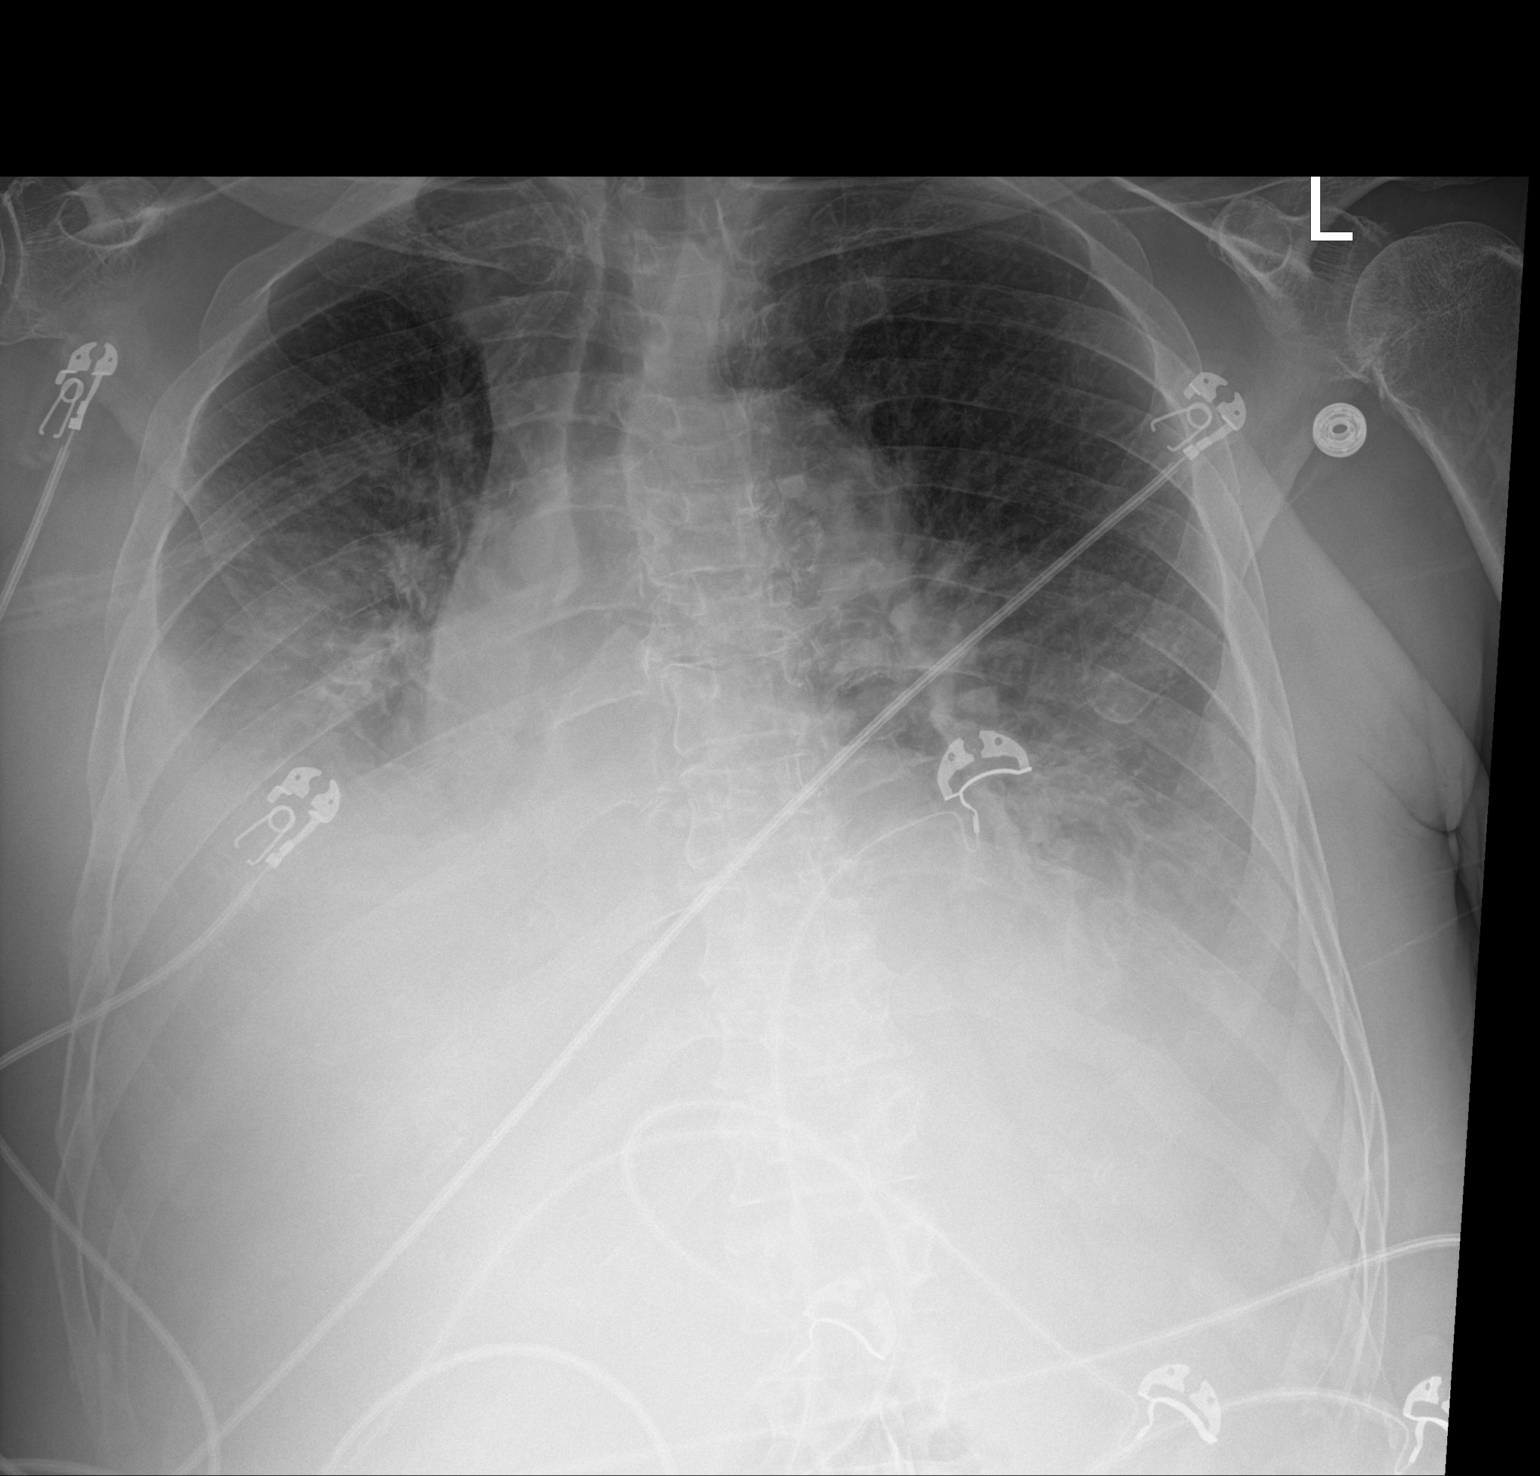

[1 of 1 positions shown; findings below may reference images not displayed]

FINDINGS: Portable AP semi upright view at 2851 hours. Similar patient
rotation to the right. Veiling opacity throughout both lower lungs,
mildly progressed on the right and stable on the left since last
month. No superimposed pneumothorax. Upper lobe pulmonary
vascularity appears stable to mildly improved from last month. No
air bronchograms. Stable visible mediastinal contours. Visualized
tracheal air column is within normal limits. Paucity of bowel gas in
the upper abdomen. No acute osseous abnormality identified.
IMPRESSION: Ongoing bilateral pleural effusions, moderate and progressed on the
right since last month.

Underlying pulmonary vascular congestion appears stable to mildly
improved.

## 2022-04-23 IMAGING — DX DG CHEST 1V PORT
1 series · 1 of 1 positions shown · non-contrast
Comparison: 12/20/2020

CLINICAL DATA: Sepsis

EXAM:
PORTABLE CHEST 1 VIEW

[chest ap]
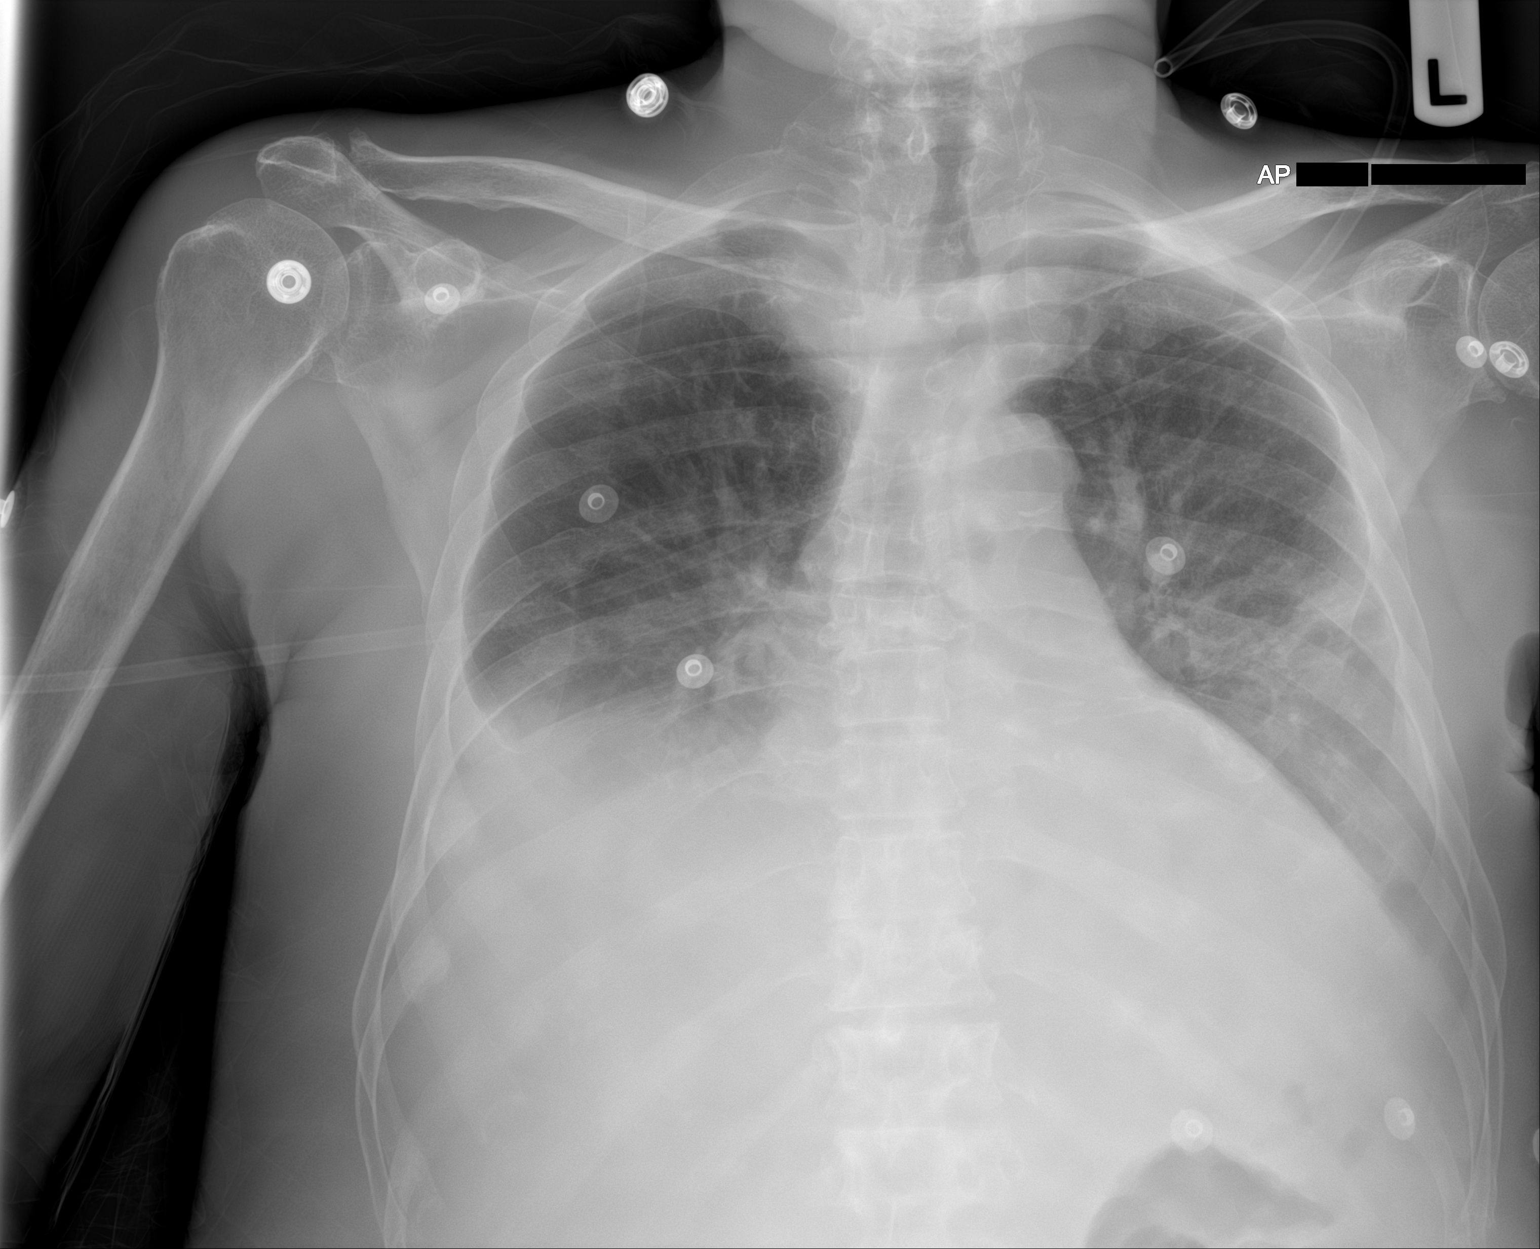

[1 of 1 positions shown; findings below may reference images not displayed]

FINDINGS: Stable cardiomegaly. Atherosclerotic calcification of the aortic
knob. Persistent moderate bilateral pleural effusions, right greater
than left. Mild pulmonary vascular congestion. Hazy bibasilar
opacities. No pneumothorax.
IMPRESSION: Cardiomegaly with pulmonary vascular congestion and moderate
bilateral pleural effusions, right greater than left. Overall,
findings are similar to the previous study.
